# Patient Record
Sex: Male | Born: 1955 | Race: White | Hispanic: No | Smoking: Former smoker
Health system: Southern US, Community
[De-identification: ages and names within clinical notes are randomized; demographics above are authoritative.]

## PROBLEM LIST (undated history)

## (undated) DIAGNOSIS — M1990 Unspecified osteoarthritis, unspecified site: Secondary | ICD-10-CM

## (undated) DIAGNOSIS — J45909 Unspecified asthma, uncomplicated: Secondary | ICD-10-CM

## (undated) DIAGNOSIS — I10 Essential (primary) hypertension: Secondary | ICD-10-CM

## (undated) DIAGNOSIS — Z8719 Personal history of other diseases of the digestive system: Secondary | ICD-10-CM

## (undated) DIAGNOSIS — M5136 Other intervertebral disc degeneration, lumbar region: Secondary | ICD-10-CM

## (undated) DIAGNOSIS — Z87442 Personal history of urinary calculi: Secondary | ICD-10-CM

## (undated) DIAGNOSIS — I219 Acute myocardial infarction, unspecified: Secondary | ICD-10-CM

## (undated) DIAGNOSIS — J449 Chronic obstructive pulmonary disease, unspecified: Secondary | ICD-10-CM

## (undated) DIAGNOSIS — K900 Celiac disease: Secondary | ICD-10-CM

## (undated) DIAGNOSIS — I471 Supraventricular tachycardia, unspecified: Secondary | ICD-10-CM

## (undated) DIAGNOSIS — K219 Gastro-esophageal reflux disease without esophagitis: Secondary | ICD-10-CM

## (undated) DIAGNOSIS — F419 Anxiety disorder, unspecified: Secondary | ICD-10-CM

## (undated) DIAGNOSIS — M51369 Other intervertebral disc degeneration, lumbar region without mention of lumbar back pain or lower extremity pain: Secondary | ICD-10-CM

## (undated) DIAGNOSIS — D649 Anemia, unspecified: Secondary | ICD-10-CM

## (undated) DIAGNOSIS — D126 Benign neoplasm of colon, unspecified: Secondary | ICD-10-CM

## (undated) DIAGNOSIS — Z87891 Personal history of nicotine dependence: Secondary | ICD-10-CM

## (undated) DIAGNOSIS — I209 Angina pectoris, unspecified: Secondary | ICD-10-CM

## (undated) DIAGNOSIS — K859 Acute pancreatitis without necrosis or infection, unspecified: Secondary | ICD-10-CM

## (undated) DIAGNOSIS — I4891 Unspecified atrial fibrillation: Secondary | ICD-10-CM

## (undated) DIAGNOSIS — I2510 Atherosclerotic heart disease of native coronary artery without angina pectoris: Secondary | ICD-10-CM

## (undated) DIAGNOSIS — G4730 Sleep apnea, unspecified: Secondary | ICD-10-CM

## (undated) DIAGNOSIS — K5790 Diverticulosis of intestine, part unspecified, without perforation or abscess without bleeding: Secondary | ICD-10-CM

## (undated) DIAGNOSIS — C801 Malignant (primary) neoplasm, unspecified: Secondary | ICD-10-CM

## (undated) DIAGNOSIS — I499 Cardiac arrhythmia, unspecified: Secondary | ICD-10-CM

## (undated) DIAGNOSIS — I5032 Chronic diastolic (congestive) heart failure: Secondary | ICD-10-CM

## (undated) DIAGNOSIS — E119 Type 2 diabetes mellitus without complications: Secondary | ICD-10-CM

## (undated) HISTORY — PX: ESOPHAGEAL DILATION: SHX303

## (undated) HISTORY — PX: TONSILLECTOMY: SUR1361

## (undated) HISTORY — DX: Anemia, unspecified: D64.9

## (undated) HISTORY — PX: BYPASS GRAFT: SHX909

## (undated) HISTORY — PX: VASCULAR SURGERY: SHX849

## (undated) HISTORY — PX: CHOLECYSTECTOMY: SHX55

---
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
## 2000-11-16 DIAGNOSIS — I219 Acute myocardial infarction, unspecified: Secondary | ICD-10-CM

## 2000-11-16 HISTORY — DX: Acute myocardial infarction, unspecified: I21.9

## 2003-11-30 ENCOUNTER — Other Ambulatory Visit: Payer: Self-pay

## 2004-06-01 ENCOUNTER — Other Ambulatory Visit: Payer: Self-pay

## 2004-07-18 ENCOUNTER — Other Ambulatory Visit: Payer: Self-pay

## 2004-10-18 ENCOUNTER — Emergency Department: Payer: Self-pay | Admitting: Emergency Medicine

## 2004-10-18 ENCOUNTER — Other Ambulatory Visit: Payer: Self-pay

## 2004-11-27 ENCOUNTER — Emergency Department: Payer: Self-pay | Admitting: Emergency Medicine

## 2004-11-28 ENCOUNTER — Emergency Department: Payer: Self-pay | Admitting: General Practice

## 2005-04-04 ENCOUNTER — Emergency Department: Payer: Self-pay | Admitting: Emergency Medicine

## 2005-05-06 ENCOUNTER — Emergency Department: Payer: Self-pay | Admitting: Emergency Medicine

## 2005-05-10 ENCOUNTER — Emergency Department: Payer: Self-pay | Admitting: Emergency Medicine

## 2005-08-14 ENCOUNTER — Emergency Department: Payer: Self-pay | Admitting: Emergency Medicine

## 2005-10-05 ENCOUNTER — Inpatient Hospital Stay: Payer: Self-pay | Admitting: Internal Medicine

## 2005-10-05 ENCOUNTER — Other Ambulatory Visit: Payer: Self-pay

## 2005-11-02 ENCOUNTER — Other Ambulatory Visit: Payer: Self-pay

## 2005-11-02 ENCOUNTER — Inpatient Hospital Stay: Payer: Self-pay | Admitting: Internal Medicine

## 2005-11-03 ENCOUNTER — Other Ambulatory Visit: Payer: Self-pay

## 2005-11-30 ENCOUNTER — Other Ambulatory Visit: Payer: Self-pay

## 2005-11-30 ENCOUNTER — Emergency Department: Payer: Self-pay | Admitting: Emergency Medicine

## 2006-01-25 ENCOUNTER — Emergency Department: Payer: Self-pay | Admitting: General Practice

## 2006-03-26 ENCOUNTER — Other Ambulatory Visit: Payer: Self-pay

## 2006-03-27 ENCOUNTER — Inpatient Hospital Stay: Payer: Self-pay

## 2006-06-10 ENCOUNTER — Emergency Department: Payer: Self-pay | Admitting: Emergency Medicine

## 2006-07-03 ENCOUNTER — Emergency Department: Payer: Self-pay | Admitting: Emergency Medicine

## 2006-07-03 ENCOUNTER — Other Ambulatory Visit: Payer: Self-pay

## 2006-12-03 ENCOUNTER — Ambulatory Visit: Payer: Self-pay | Admitting: Cardiovascular Disease

## 2007-02-14 IMAGING — CR LEFT INDEX FINGER 2+V
1 series · 1 of 1 positions shown · non-contrast
Comparison: none

REASON FOR EXAM: Laceration, caught between door hinges
COMMENTS:  LMP: (Male)

[view not recorded]
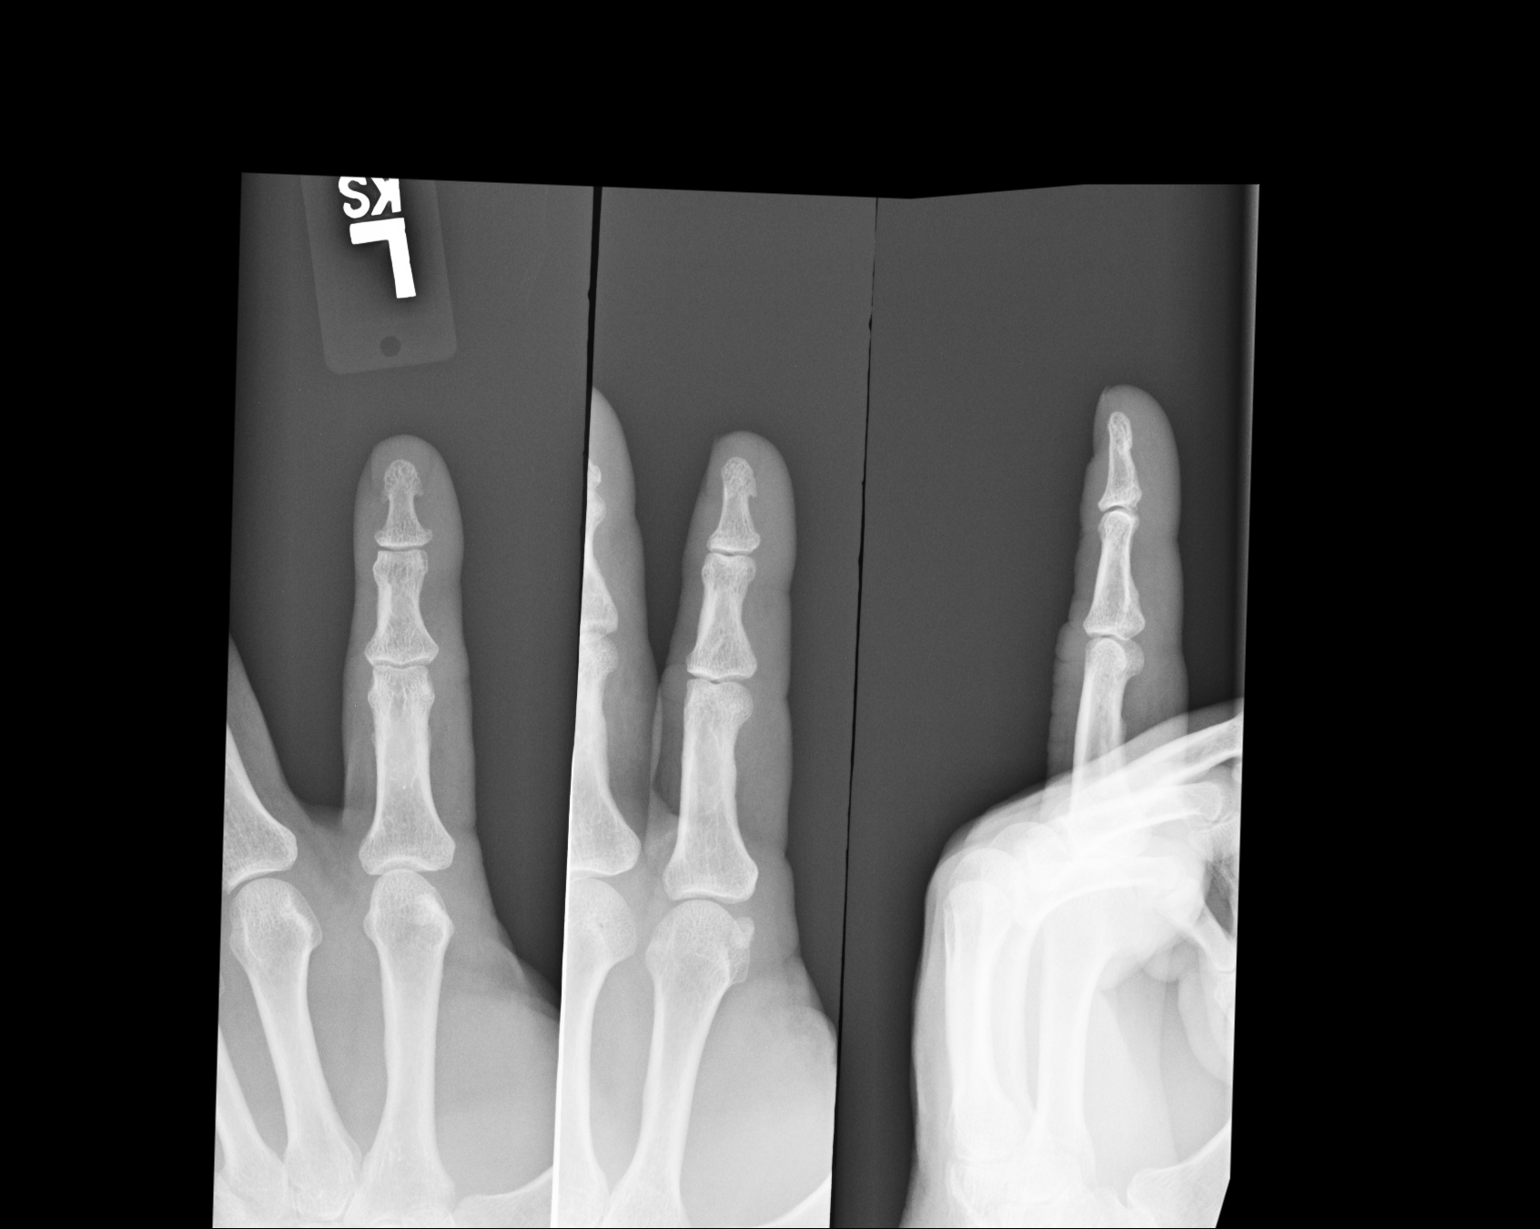

[1 of 1 positions shown; findings below may reference images not displayed]

PROCEDURE:     DXR - DXR FINGER INDEX 2ND DIGIT LT HA  - April 04, 2005  [DATE]

RESULT:     There does not appear to be evidence of fracture, dislocation,
or malalignment.  If there is persistent clinical concern or persistent
complaints of pain, repeat evaluation in 7-10 days is recommended if
clinically warranted.
IMPRESSION: Unremarkable LEFT second finger.

## 2007-08-17 IMAGING — CR DG CHEST 1V PORT
1 series · 1 of 1 positions shown · non-contrast
Comparison: none

REASON FOR EXAM: chest pain
COMMENTS:

[view not recorded]
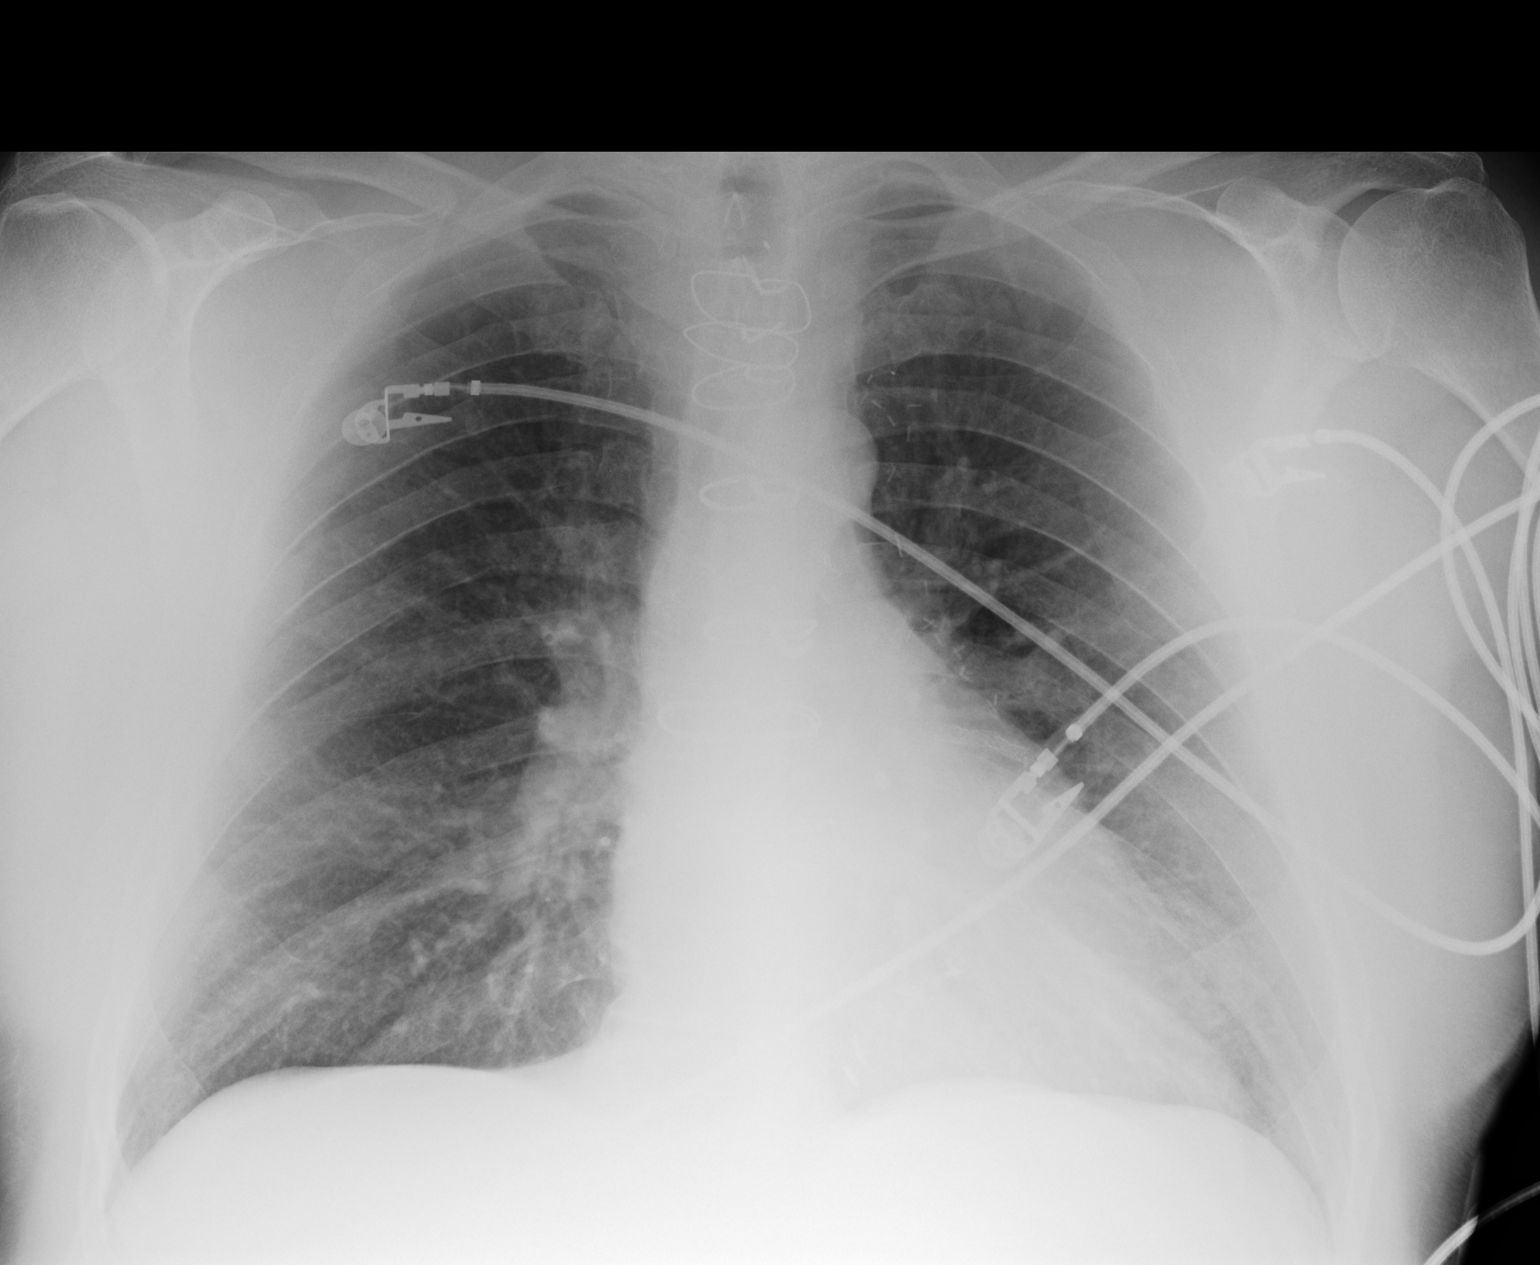

[1 of 1 positions shown; findings below may reference images not displayed]

PROCEDURE:     DXR - DXR PORTABLE CHEST SINGLE VIEW  - October 05, 2005  [DATE]

RESULT:     Comparison is made to study 28 November, 2004.

The lungs are mildly hyperinflated. Mildly increased perihilar lung markings
are present, but are stable. The heart is top normal in size.  The central
pulmonary vascularity is prominent but stable. There is no pleural effusion.
IMPRESSION: 1)There are findings consistent with COPD. Mildly increased interstitial
markings are present and stable and may reflect an element of low-grade
interstitial edema.  No pleural effusion is seen and there is no overt
evidence of alveolar edema. A follow-up PA and lateral chest x-ray would be
of value.

## 2007-09-14 IMAGING — CR DG ABDOMEN 1V
1 series · 2 of 2 positions shown · non-contrast
Comparison: none

REASON FOR EXAM: Abdominal pain,  rm 19
COMMENTS:

[Series 1: view not recorded · 0.17mm/px · 2 of 2 slices shown]
[im 1/2]
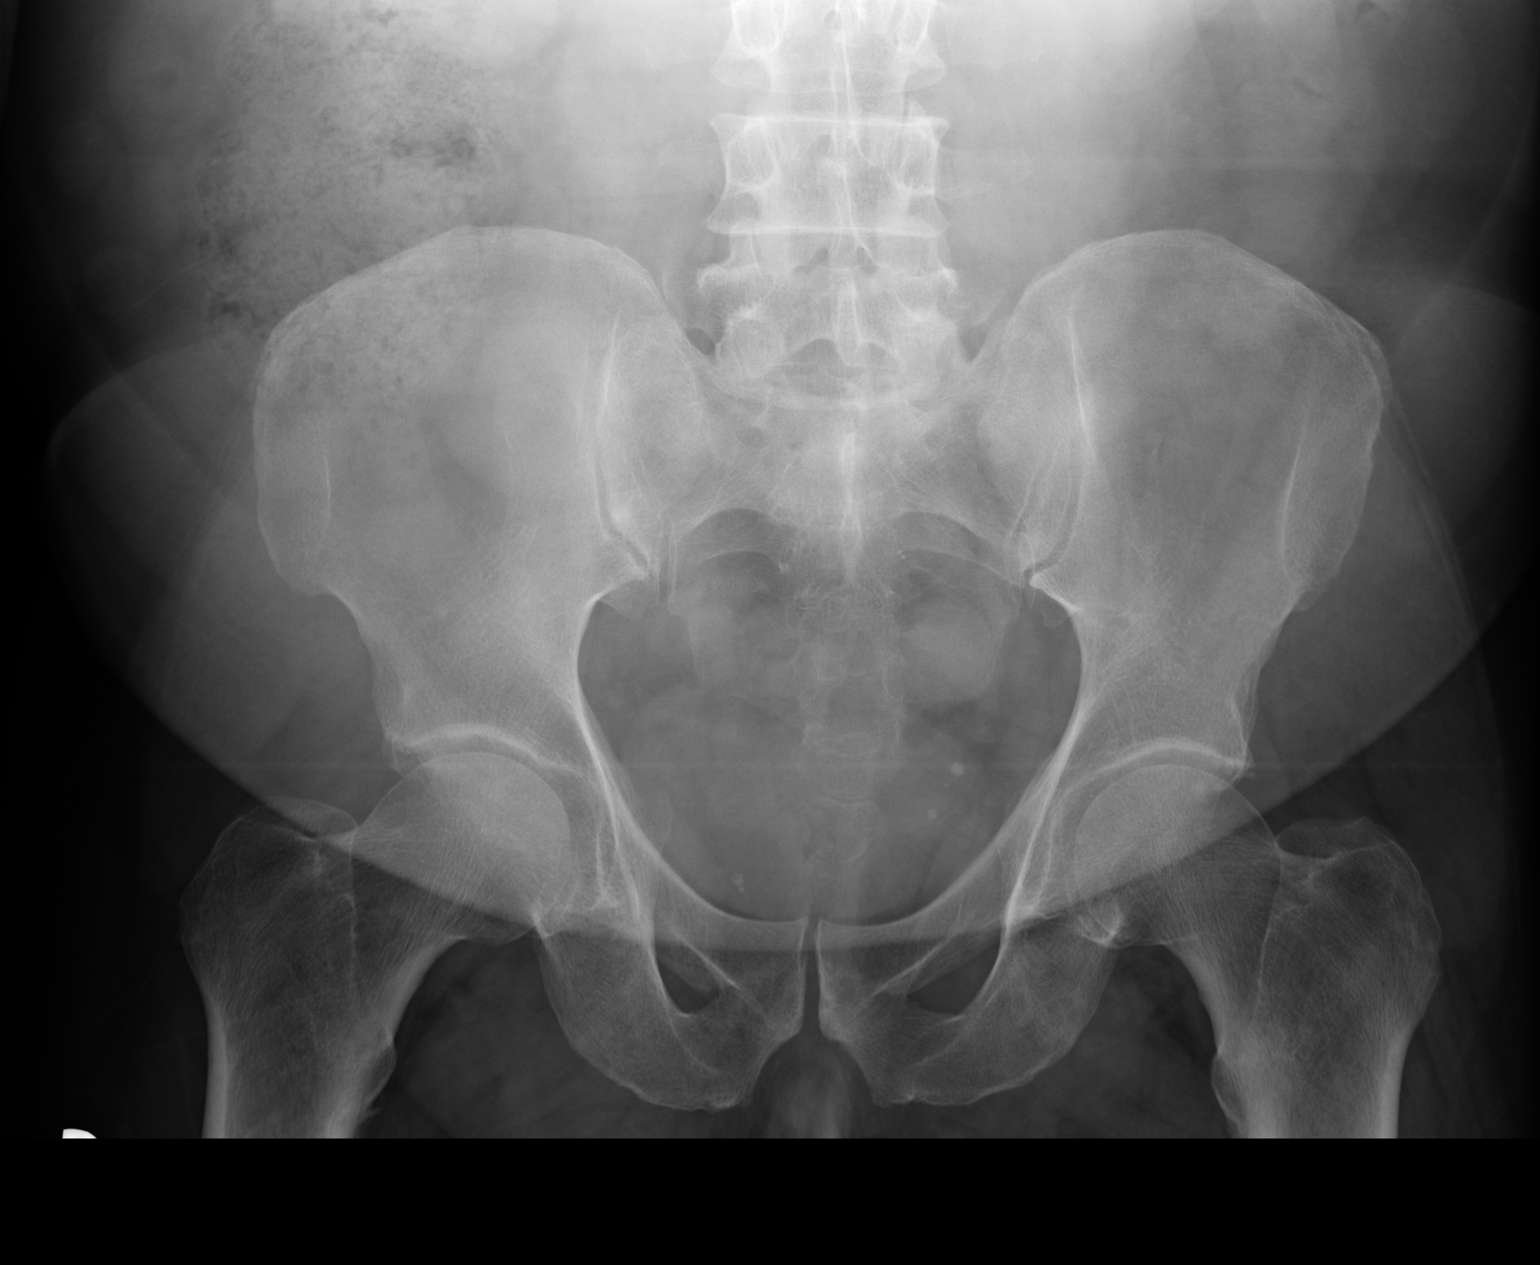
[im 2/2]
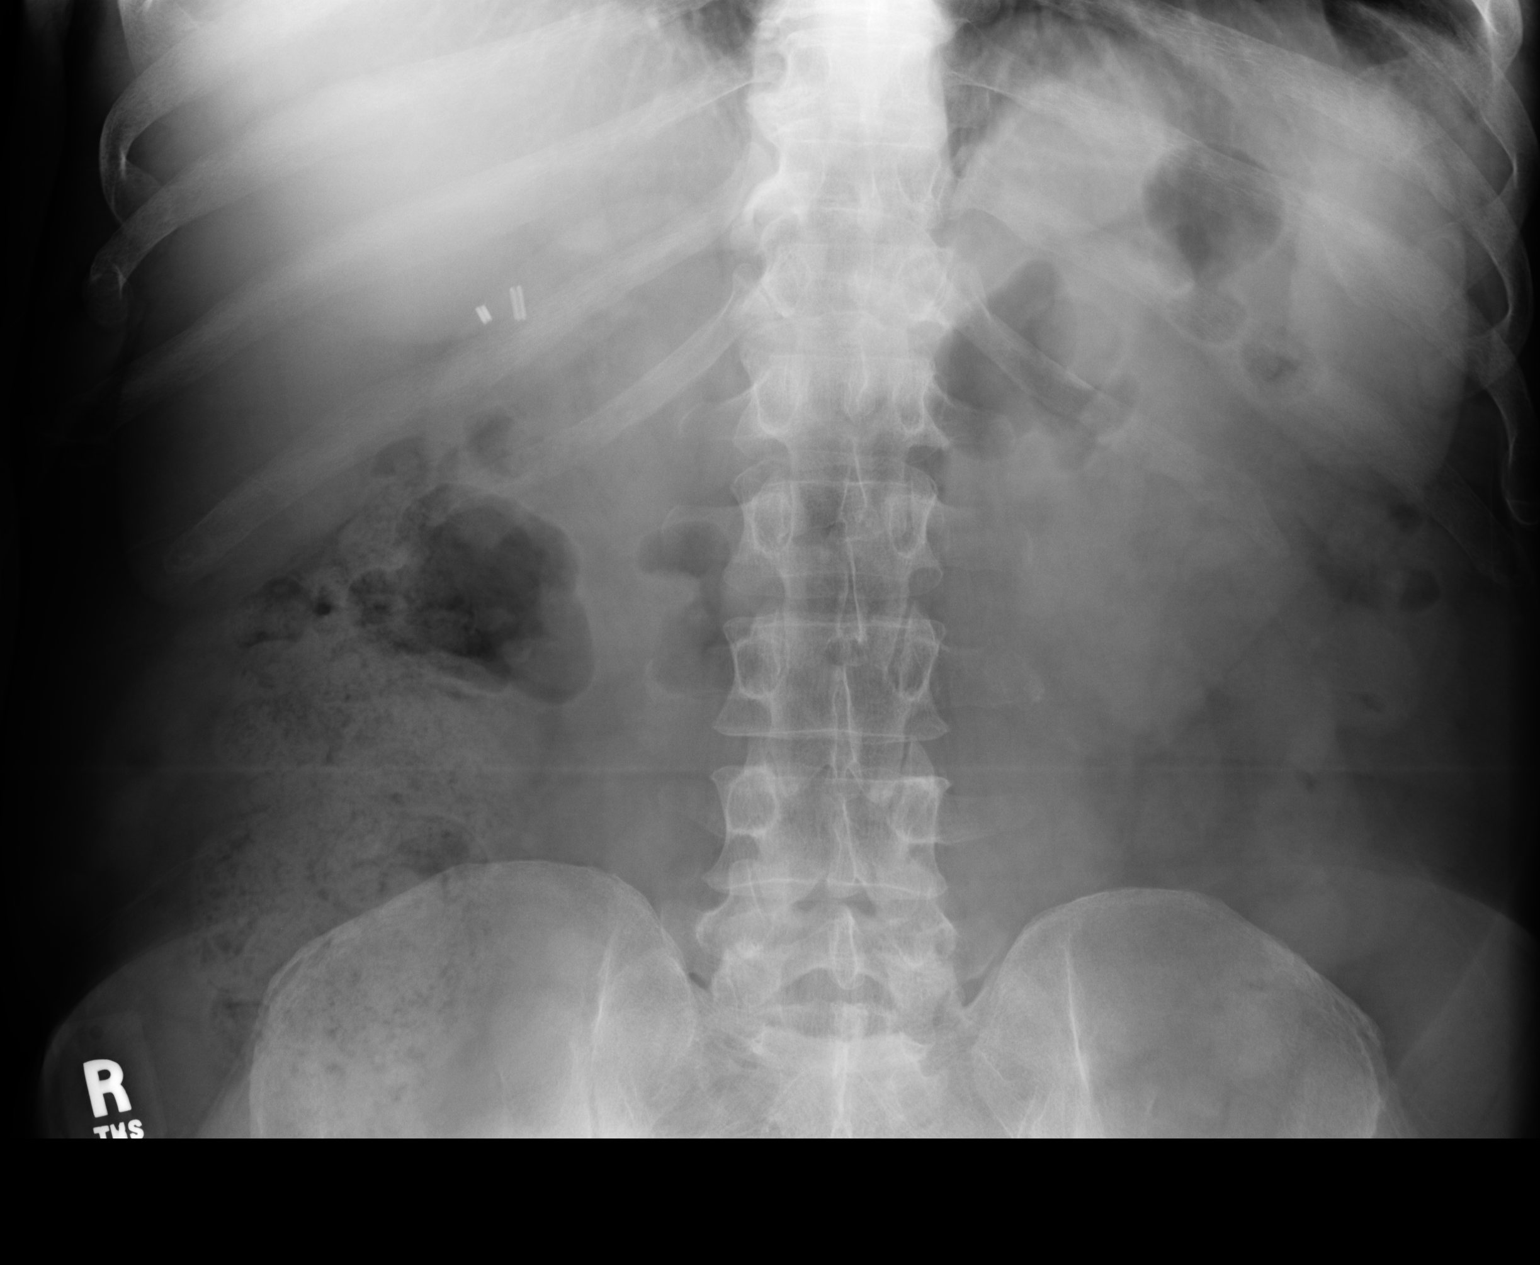

[2 of 2 positions shown; findings below may reference images not displayed]

PROCEDURE:     DXR - DXR KIDNEY URETER BLADDER  - November 02, 2005  [DATE]

RESULT:        Views of the abdomen demonstrate cholecystectomy clips in the
RIGHT upper quadrant.  Some radiopacities are seen in the LEFT pelvic region
and could represent phleboliths or distal LEFT ureteral stones.  Correlation
with IVP or CT could be obtained, if desired.  There are no films for
comparison.
IMPRESSION: 1.     No evidence of bowel obstruction.  There is a moderate amount of
stool scattered through the colon down to the rectum.
2.     Cannot exclude the possibility of some distal LEFT ureteral stones.
3.     Status post cholecystectomy.

## 2007-09-17 IMAGING — CT CT ABDOMEN W/ CM
1 of 2 series · 15 of 32 positions shown, 19 images · non-contrast
Comparison: none

REASON FOR EXAM: Pancreatitis,elevated enzymes
COMMENTS:

[Series 2: pancreas · axial · 0.79mm/px · z∈[-362,-110]mm · 15 of 71 slices shown, 19 images]
[im 4/71  soft-tissue]
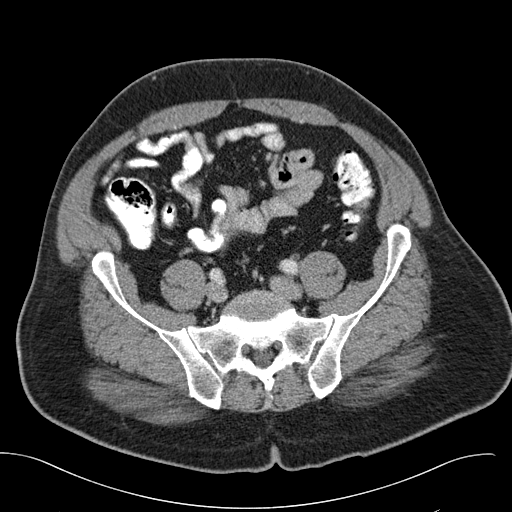
[im 4/71  bone]
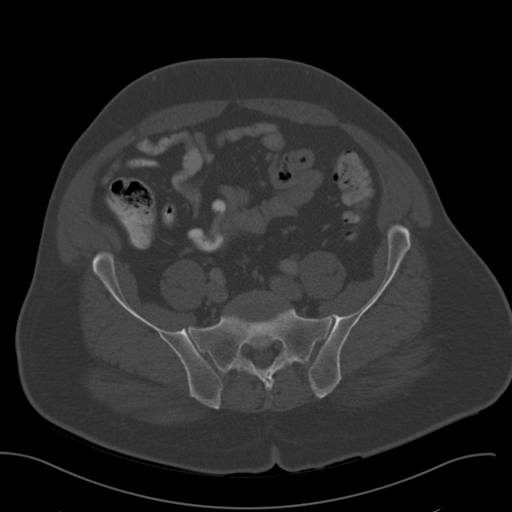
[im 10/71  soft-tissue]
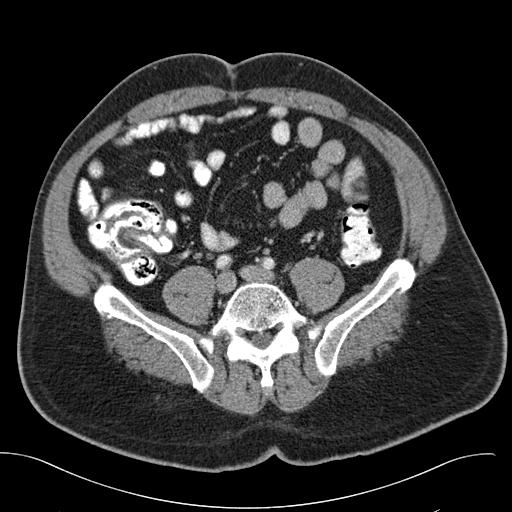
[im 16/71  soft-tissue]
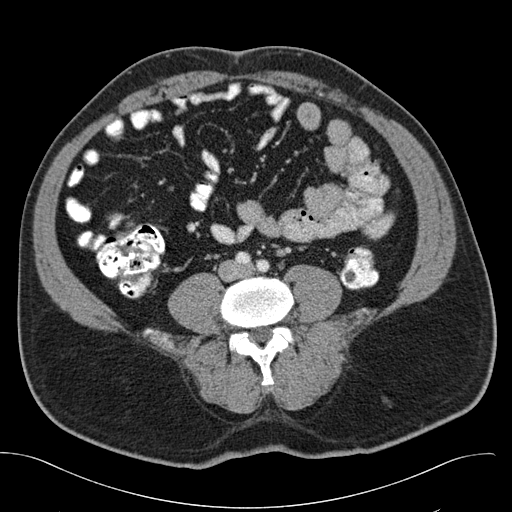
[im 19/71  soft-tissue]
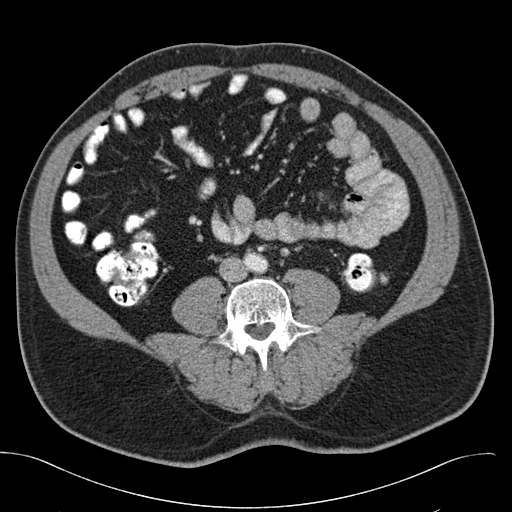
[im 25/71  soft-tissue]
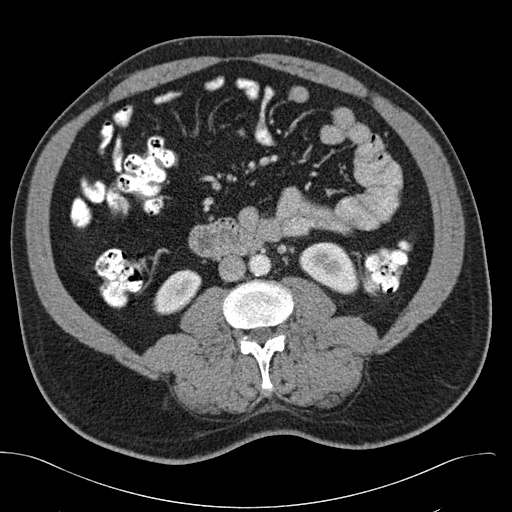
[im 31/71  soft-tissue]
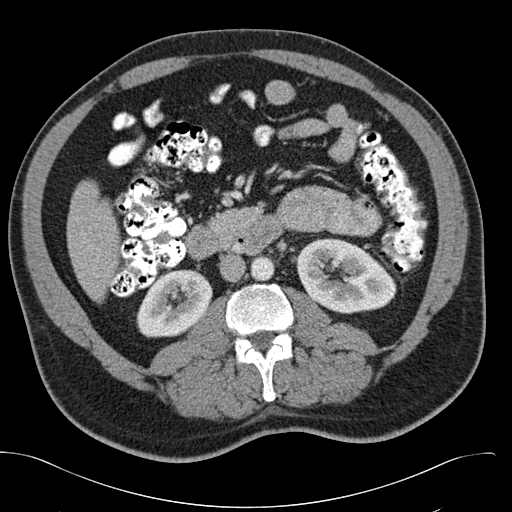
[im 37/71  soft-tissue]
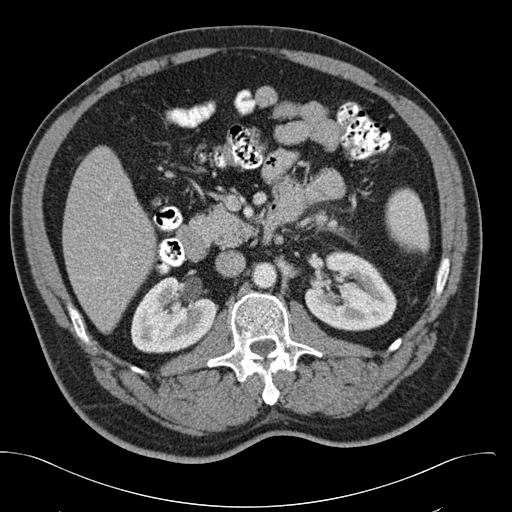
[im 40/71  soft-tissue]
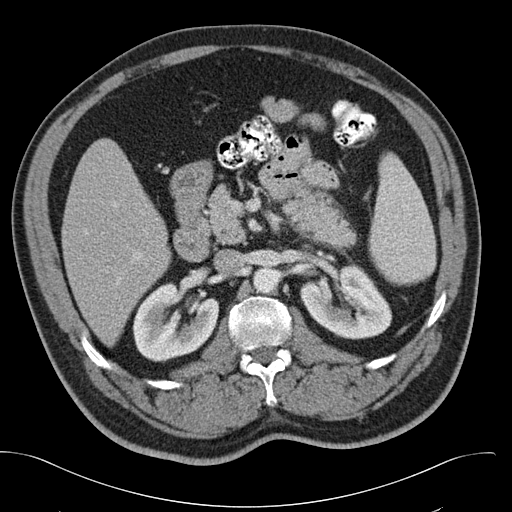
[im 46/71  soft-tissue]
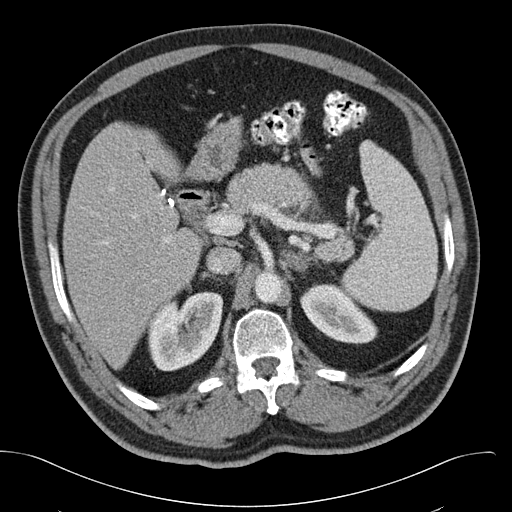
[im 46/71  bone]
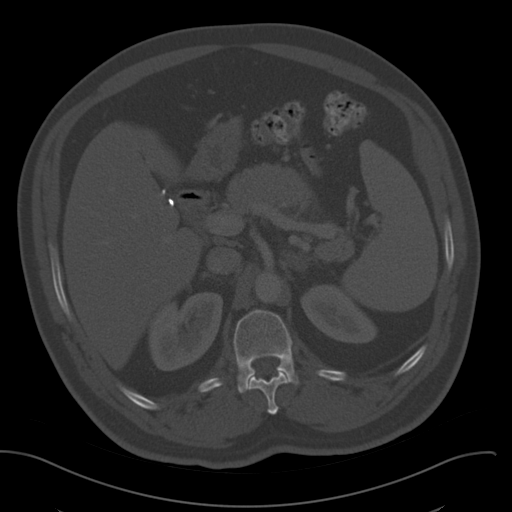
[im 52/71  soft-tissue]
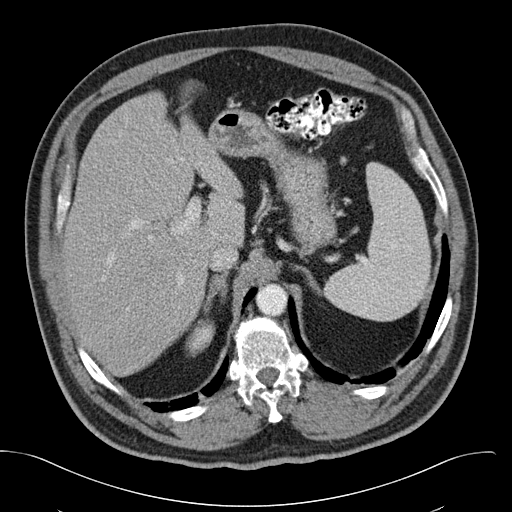
[im 55/71  soft-tissue]
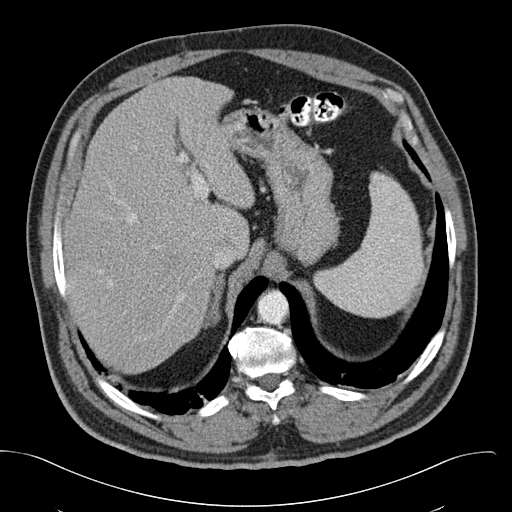
[im 58/71  lung]
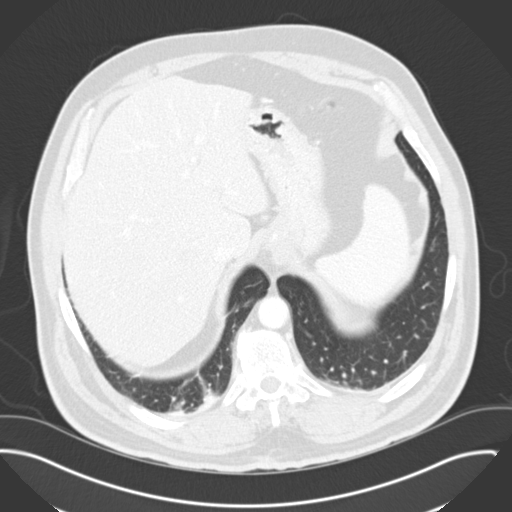
[im 61/71  soft-tissue]
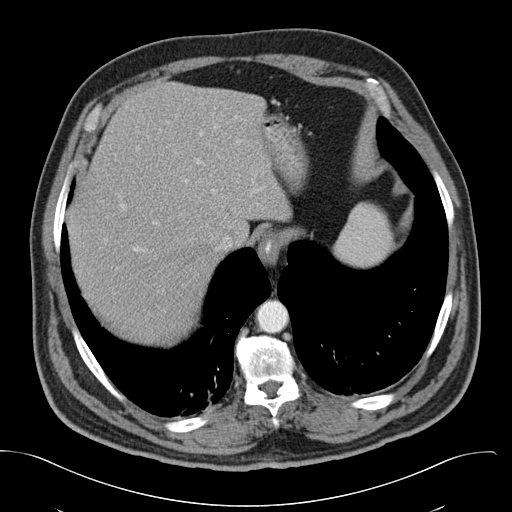
[im 61/71  lung]
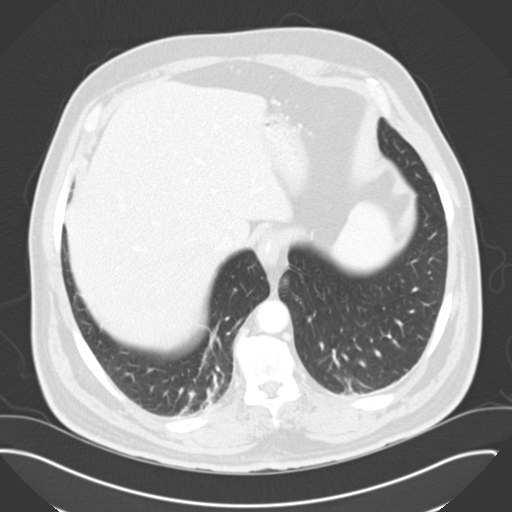
[im 64/71  lung]
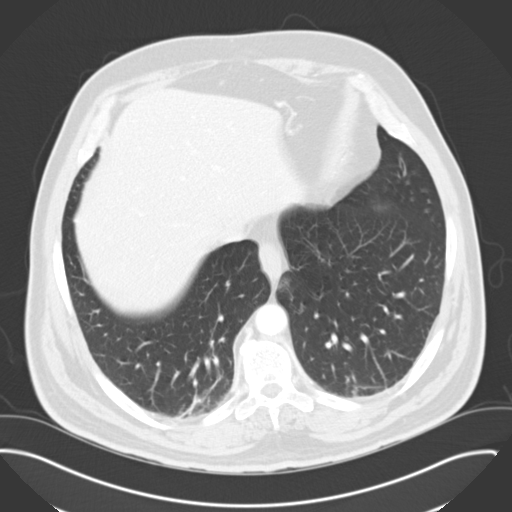
[im 67/71  soft-tissue]
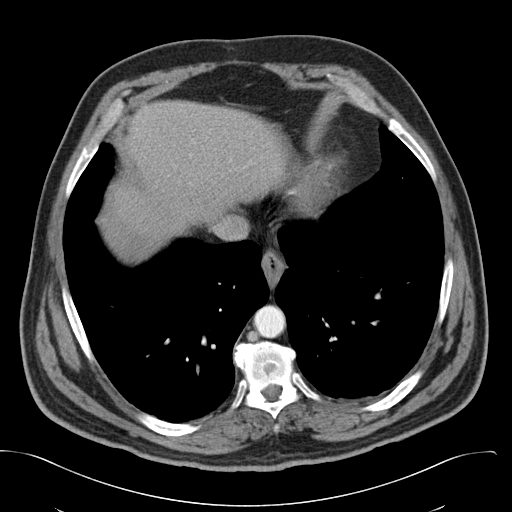
[im 67/71  lung]
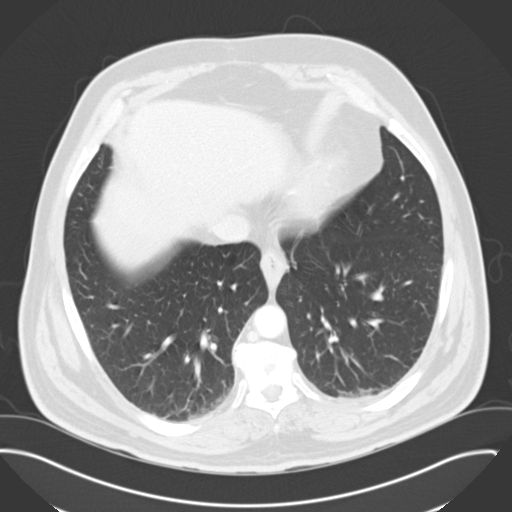

[15 of 32 positions shown; findings below may reference images not displayed]

PROCEDURE:     CT  - CT ABDOMEN COMPLEX W  - November 05, 2005  [DATE]

RESULT:         Comparison is made to a prior study dated 04/19/02.

Spiral 4-mm sections were obtained from the lung bases through the superior
iliac crest.
Evaluation of the lung bases demonstrates dependent atelectasis and/or
scarring and otherwise grossly unremarkable.  Note, if clinically warranted
bibasilar minimal infiltrates cannot be excluded.

The liver, spleen, kidneys are unremarkable.  The adrenals demonstrate a
prominent appearance though maintain an adreniform shape and appears stable
when compared to the previous study.  Evaluation of the pancreas
demonstrates no evidence of peripancreatic fluid nor drainable loculated
fluid collections.  A mild amount of inflammatory change is demonstrated
within the inferior/posterior tail of the pancreas region likely
representing an element of mild mesentery edema.  The celiac, SMA, portal
vein, IMA, renal arteries and veins are opacified and appear unremarkable.
There is no CT evidence to suggest the sequela of a pseudoaneurysm.  The
patient is status post a cholecystectomy.  No drainable loculated fluid
collections are demonstrated within the abdomen nor masses or adenopathy.
IMPRESSION: CT findings suggesting the sequela of early/mild pancreatitis without
evidence of pseudocyst nor a significant free fluid or further areas of
drainable loculated fluid collections.  As stated above, the vasculature
appears to be opacified.  The caliber of the vessels cannot be excluded.
There is no CT evidence of pseudoaneurysm.

## 2007-10-12 IMAGING — CR DG CHEST 2V
1 series · 2 of 2 positions shown · non-contrast
Comparison: none

REASON FOR EXAM: Shortness of breath
COMMENTS:

[Series 1: view not recorded · 0.17mm/px · 2 of 2 slices shown]
[im 1/2]
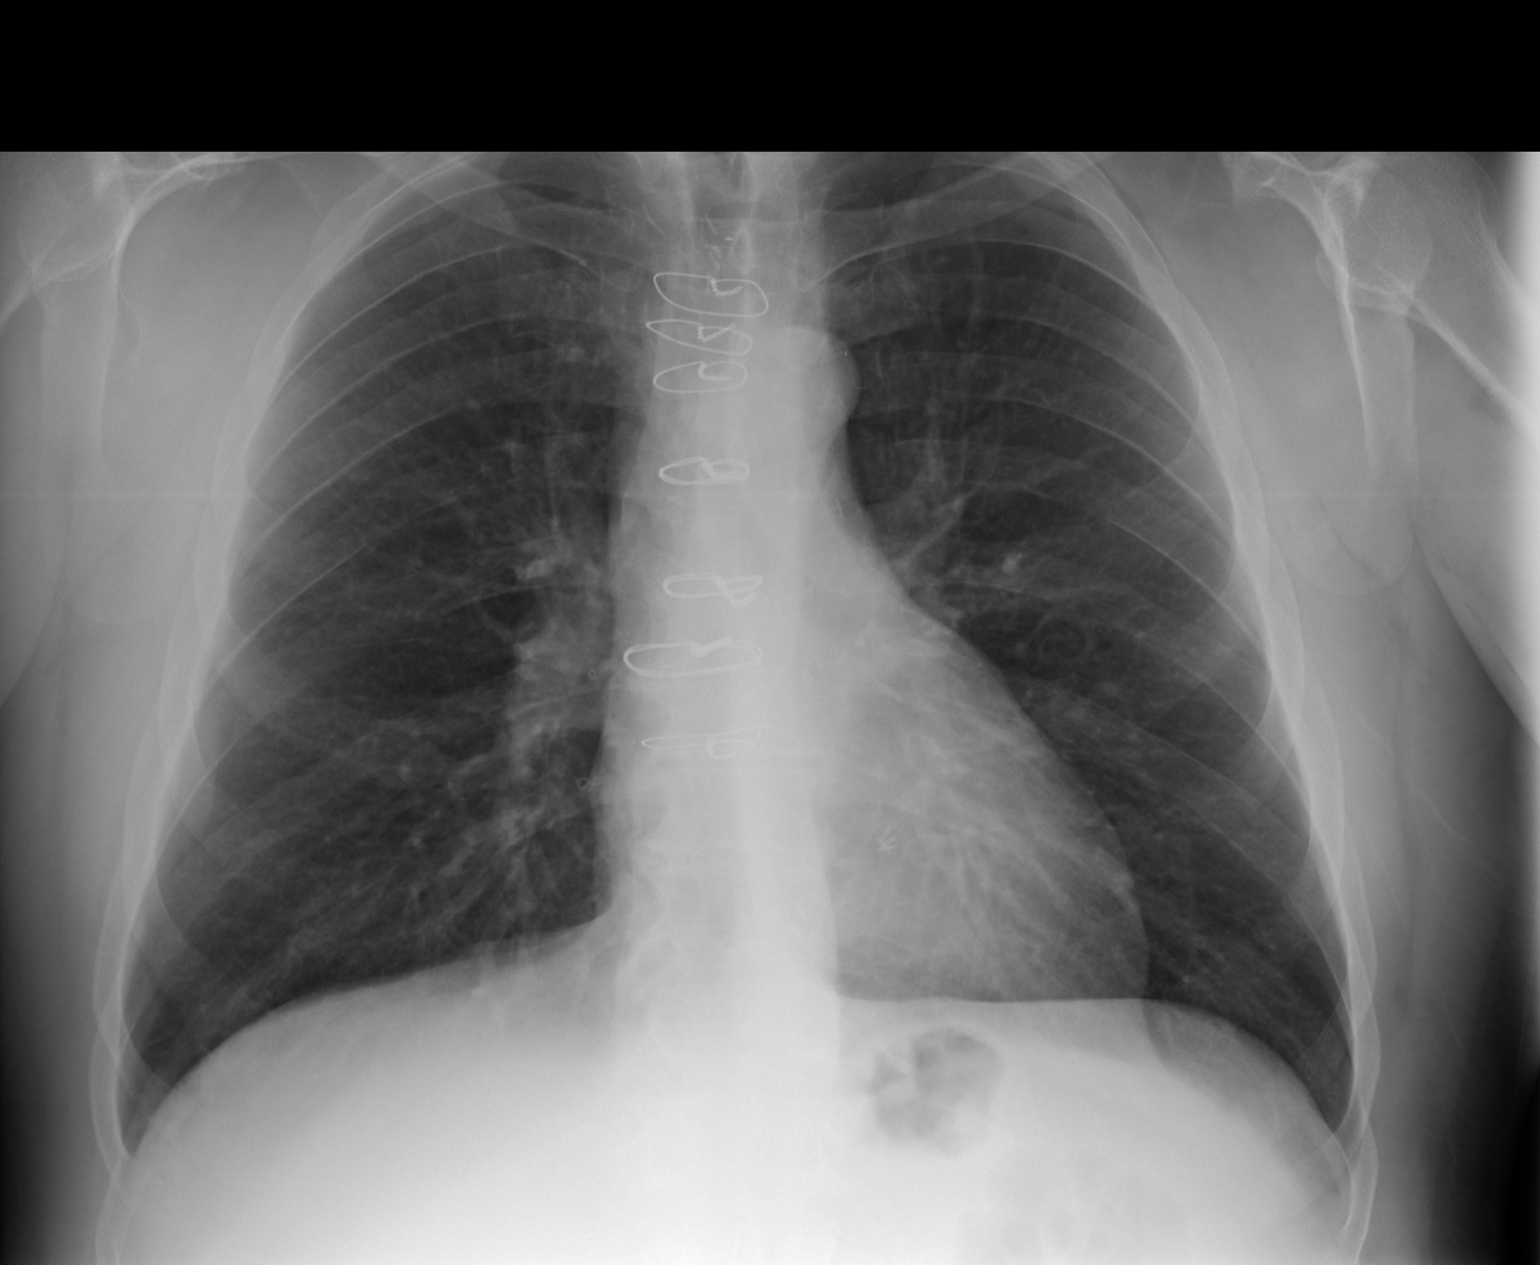
[im 2/2]
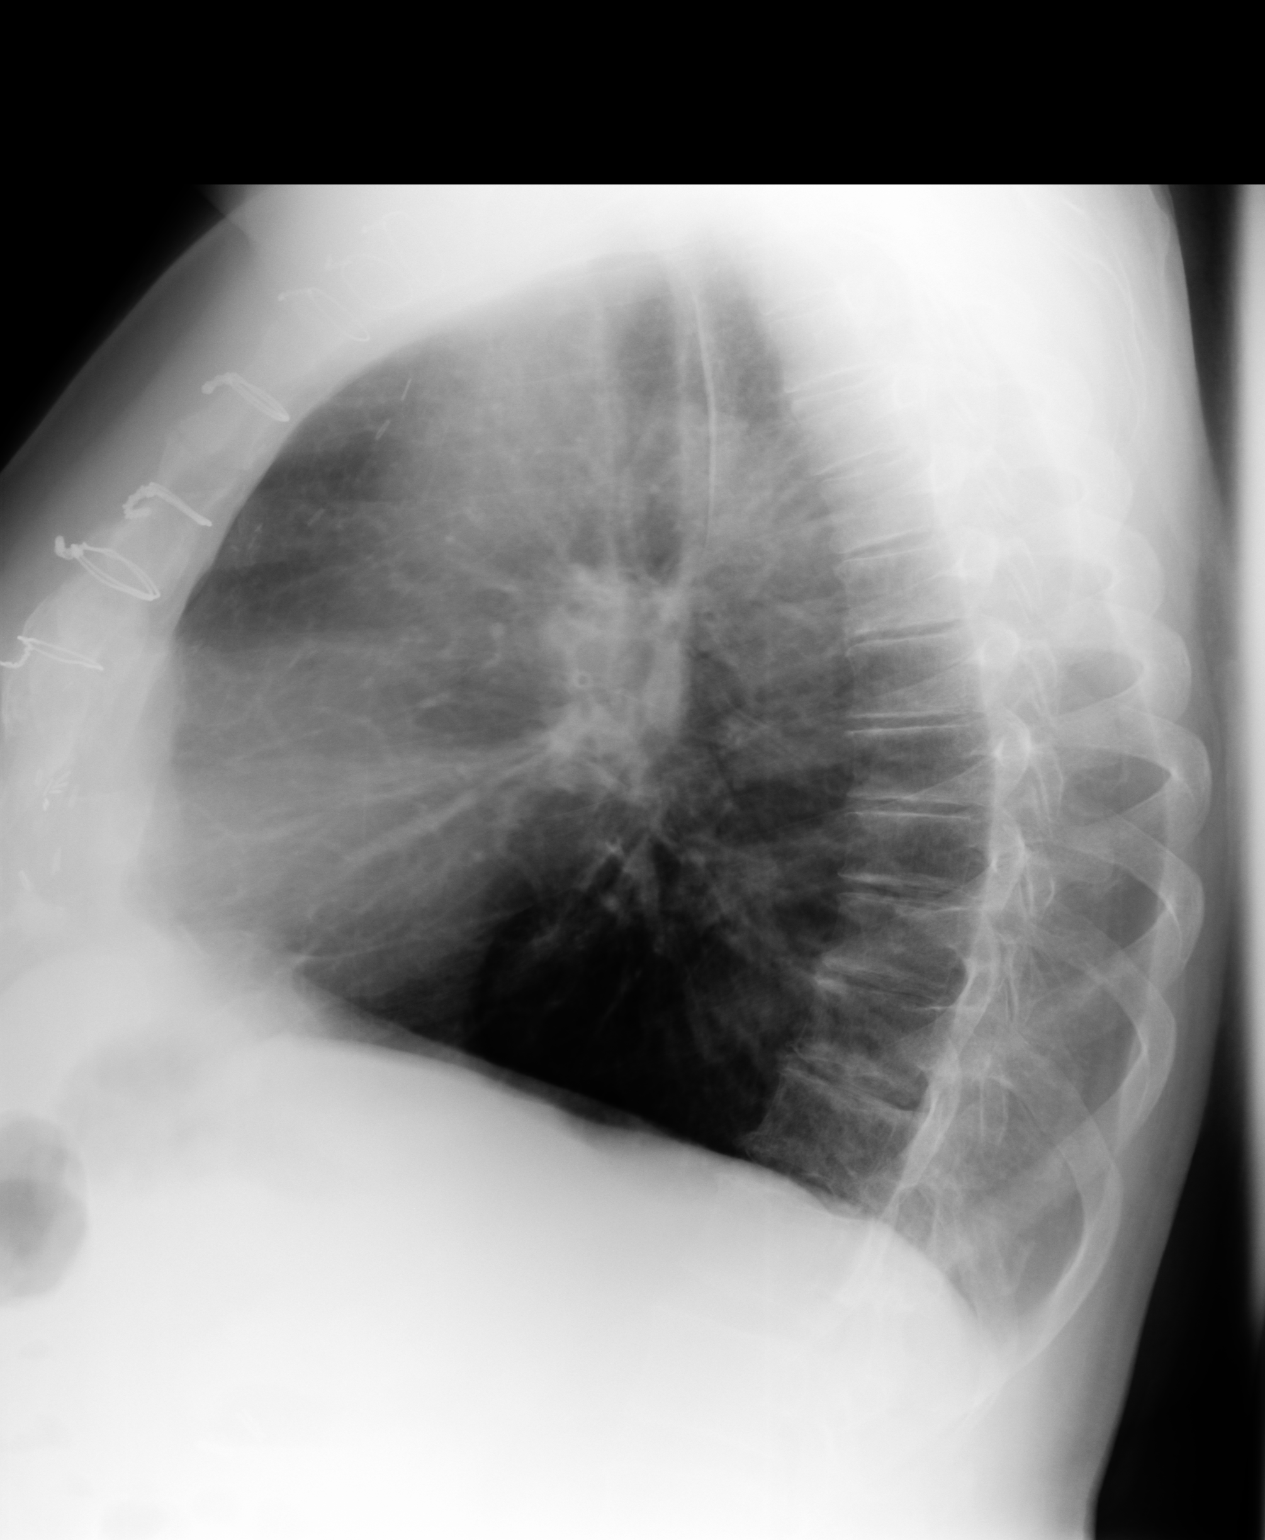

[2 of 2 positions shown; findings below may reference images not displayed]

PROCEDURE:     DXR - DXR CHEST PA (OR AP) AND LATERAL  - November 30, 2005 [DATE]

RESULT:     The current exam is compared to a prior exam of 11/02/2005.

The lung fields are clear. The chest appears mildly hyperexpanded compatible
with a history of asthma or COPD. The heart size is normal. Post-operative
changes of prior CABG are noted.
IMPRESSION: 1.     The lung fields are clear.
2.     The patient is status post CABG.
3.     There is mild hyperexpansion of the lungs compatible with a history
of COPD or asthma but with no progressive hyperexpansion seen as compared to
the prior exam.

## 2008-02-05 IMAGING — CR DG ABDOMEN 3V
1 series · 5 of 5 positions shown · non-contrast
Comparison: none

REASON FOR EXAM: ABDOMINAL PAIN
COMMENTS:  LMP: (Male)

[Series 1: view not recorded · 0.17mm/px · 5 of 5 slices shown]
[im 1/5]
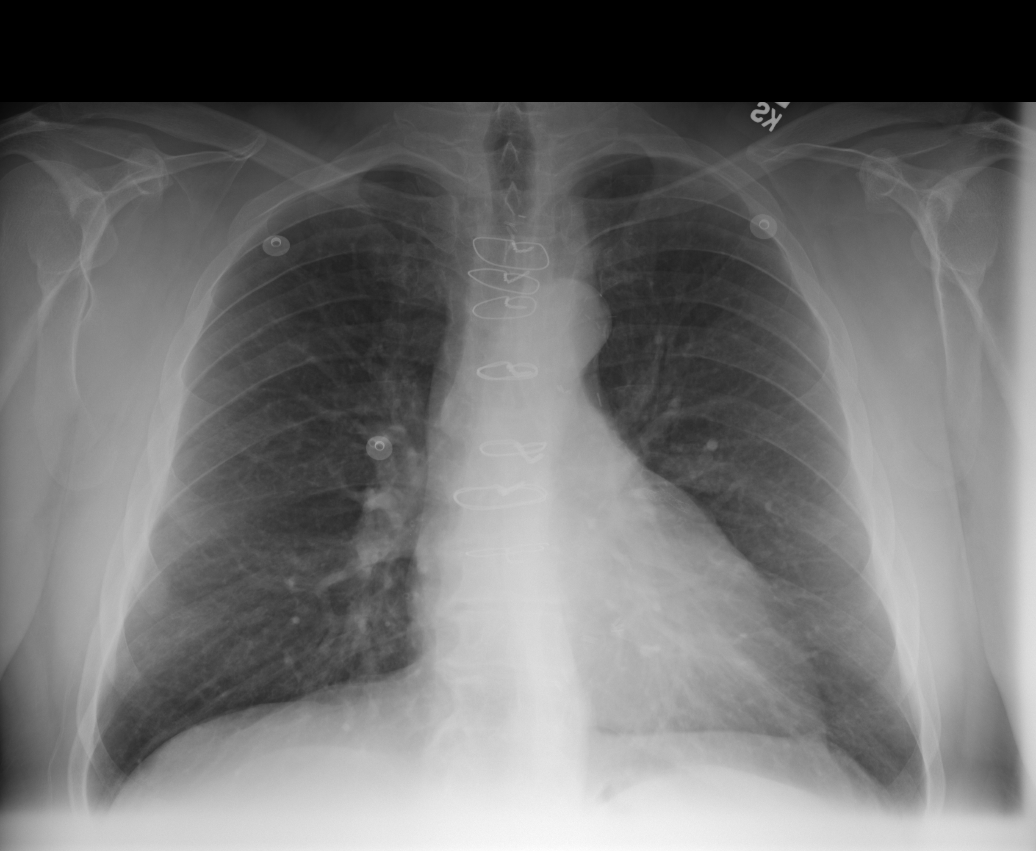
[im 2/5]
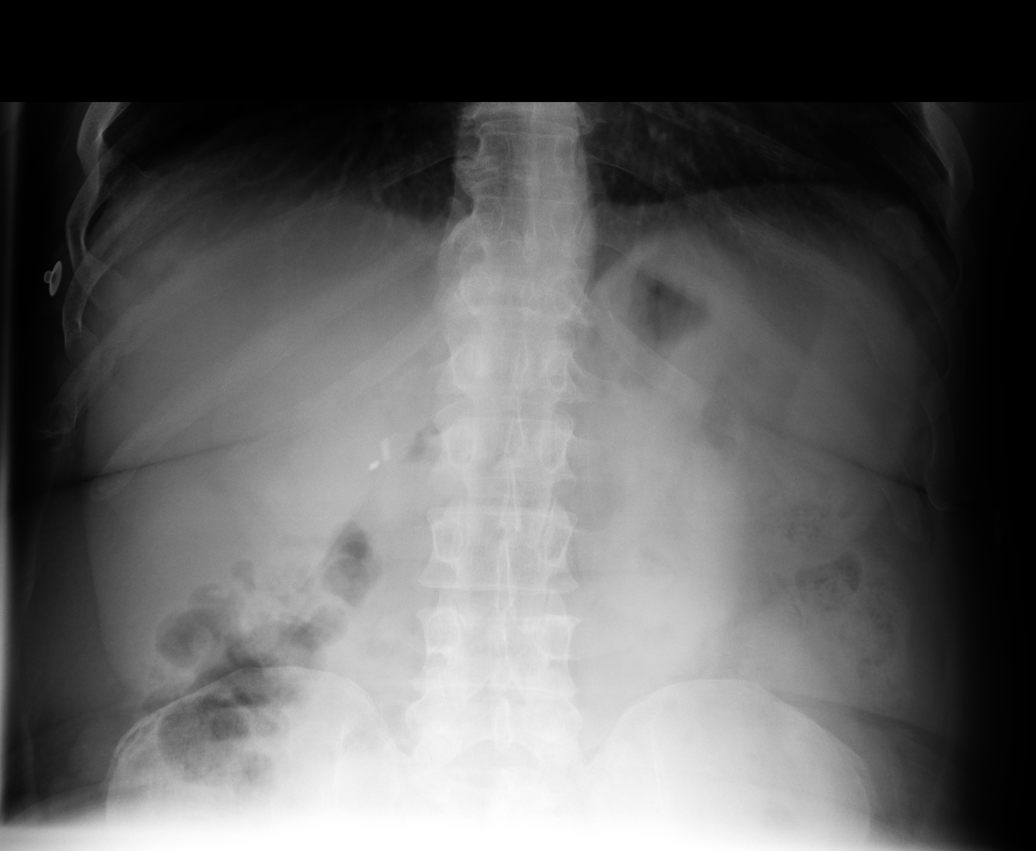
[im 3/5]
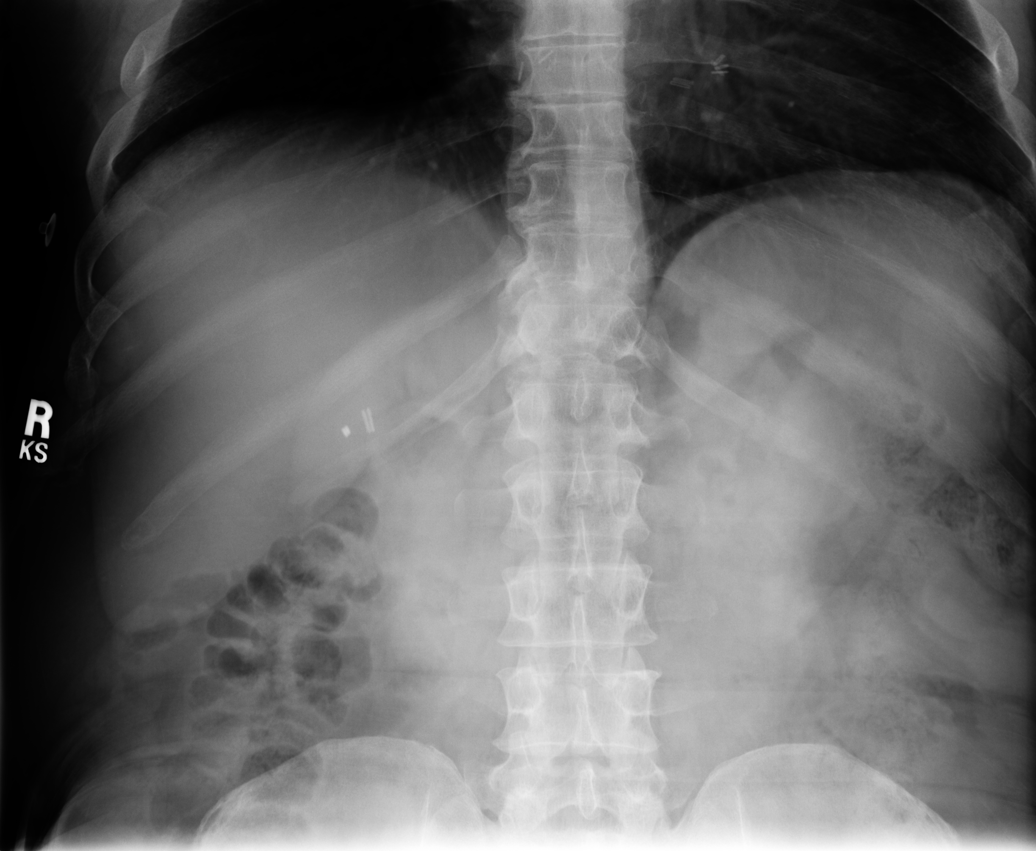
[im 4/5]
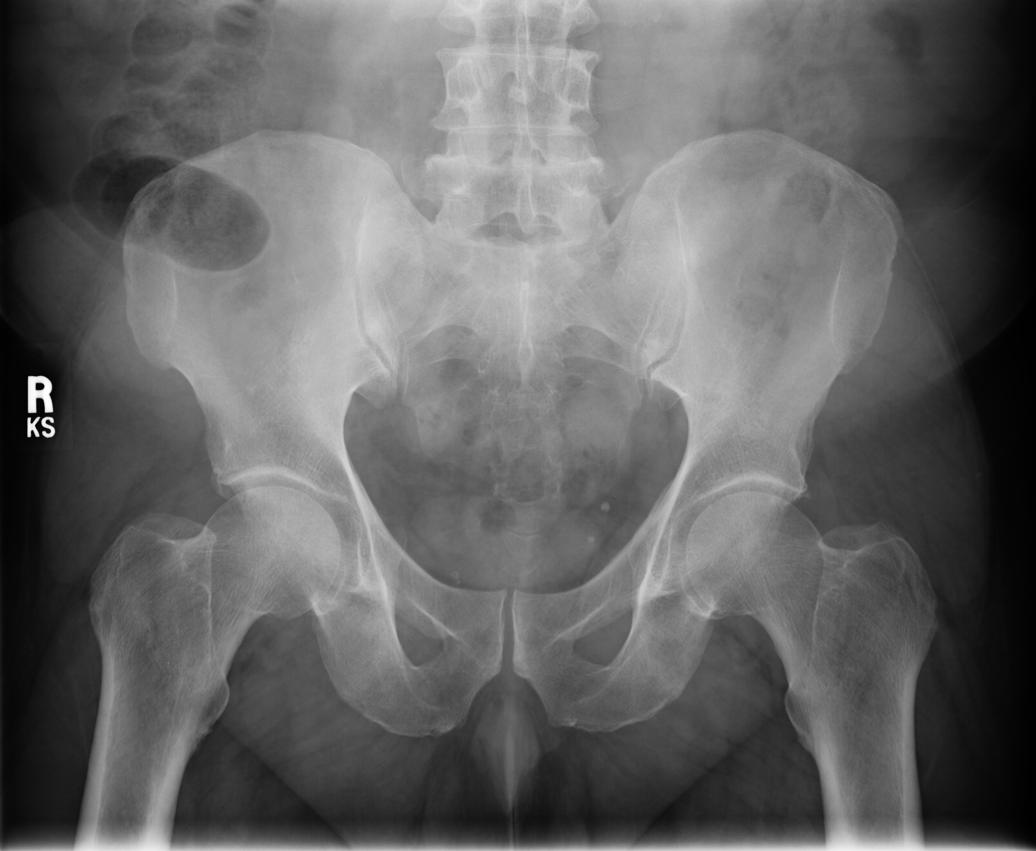
[im 5/5]
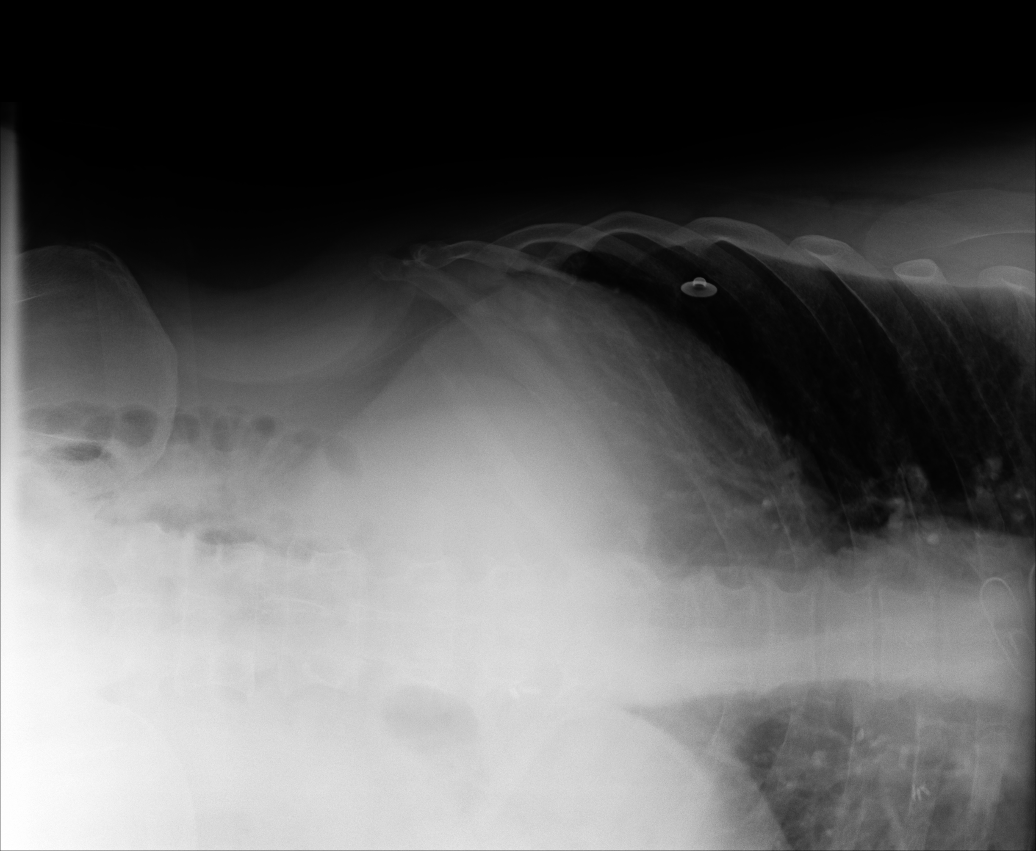

[5 of 5 positions shown; findings below may reference images not displayed]

PROCEDURE:     DXR - DXR ABDOMEN 3-WAY (INCL PA CXR)  - March 26, 2006  [DATE]

RESULT:          The soft tissue structures are unremarkable.  The gas
pattern is nonspecific.  No free air is identified.

PA chest demonstrates no acute cardiopulmonary disease.  The patient has had
a prior median sternotomy.  Surgical clips are present in the RIGHT upper
quadrant.
IMPRESSION: Nonspecific exam.  Chest x-ray reveals evidence of a
prior median sternotomy and is otherwise unremarkable.

## 2008-04-21 IMAGING — CR DG FOOT COMPLETE 3+V*L*
1 series · 3 of 3 positions shown · non-contrast
Comparison: none

REASON FOR EXAM: Pain
COMMENTS:

PROCEDURE:     DXR - DXR FOOT LT COMP W/OBLIQUES  - June 10, 2006 [DATE]
RESULT:     Three views of the LEFT foot show no fracture, dislocation or
other acute bony abnormality.
Incidental note is made of a small, plantar calcaneal spur.

[Series 1: view not recorded · 0.17mm/px · 3 of 3 slices shown]
[im 1/3]
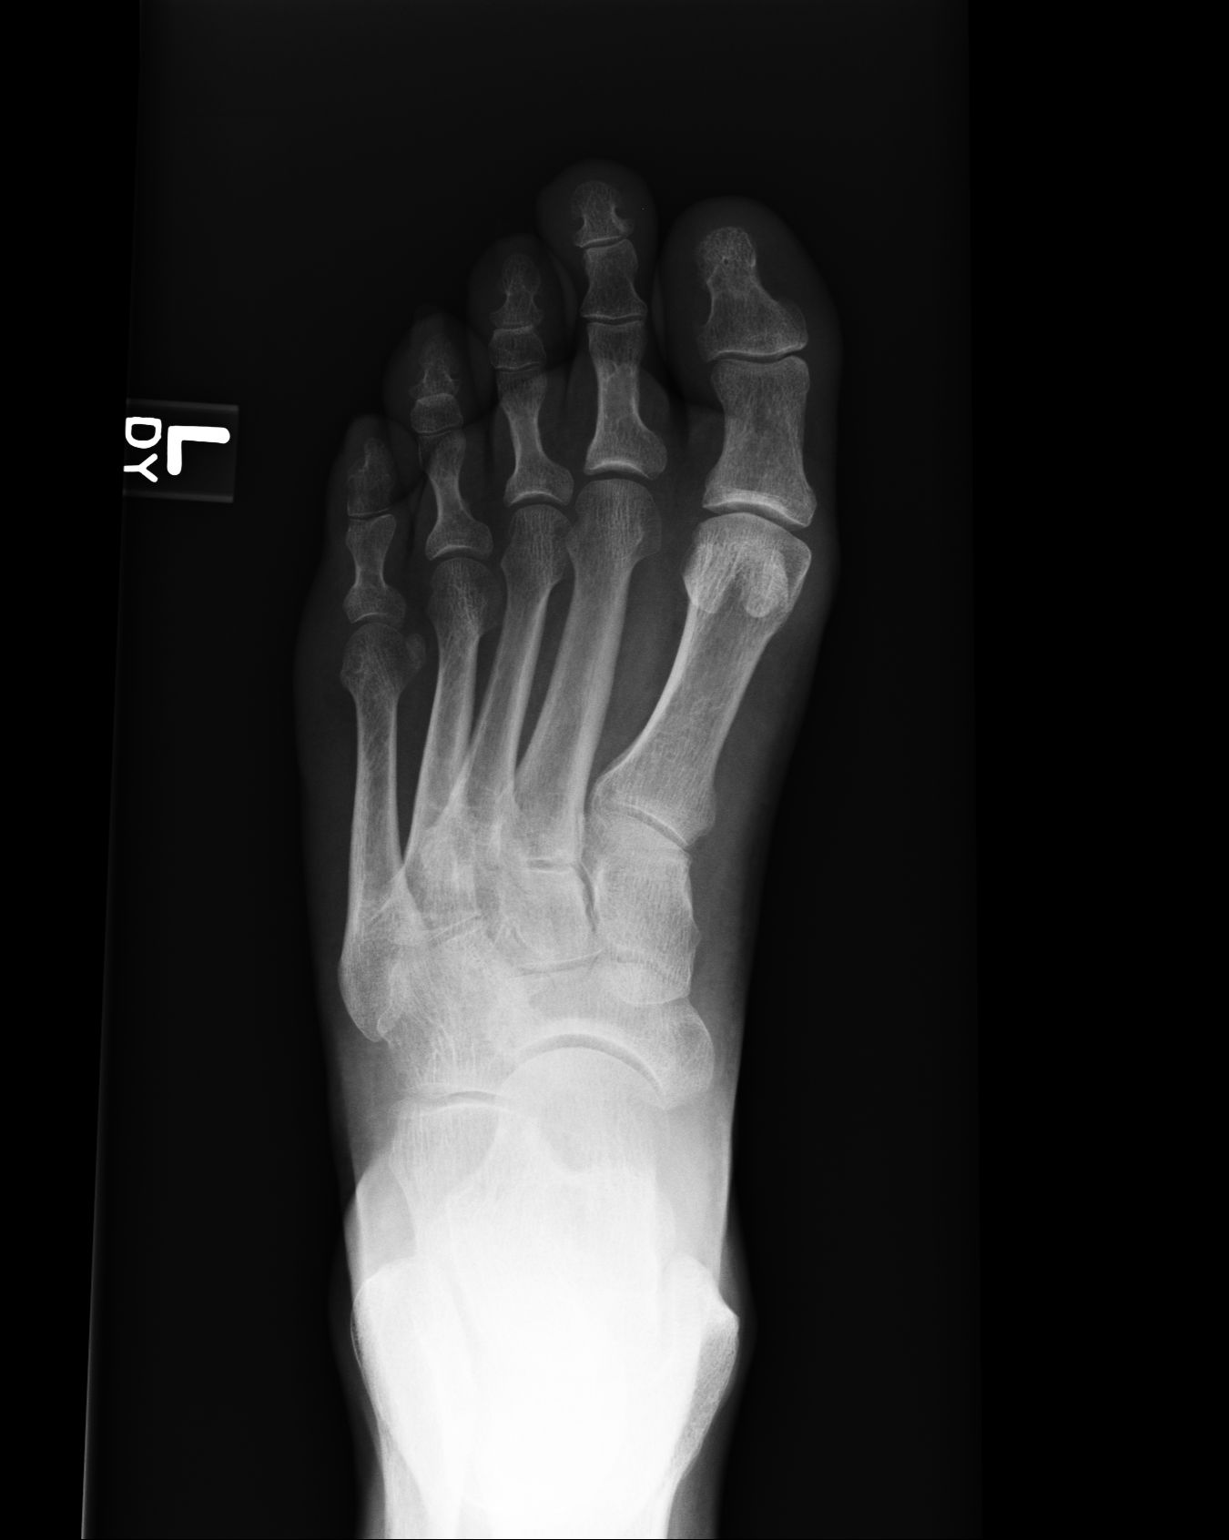
[im 2/3]
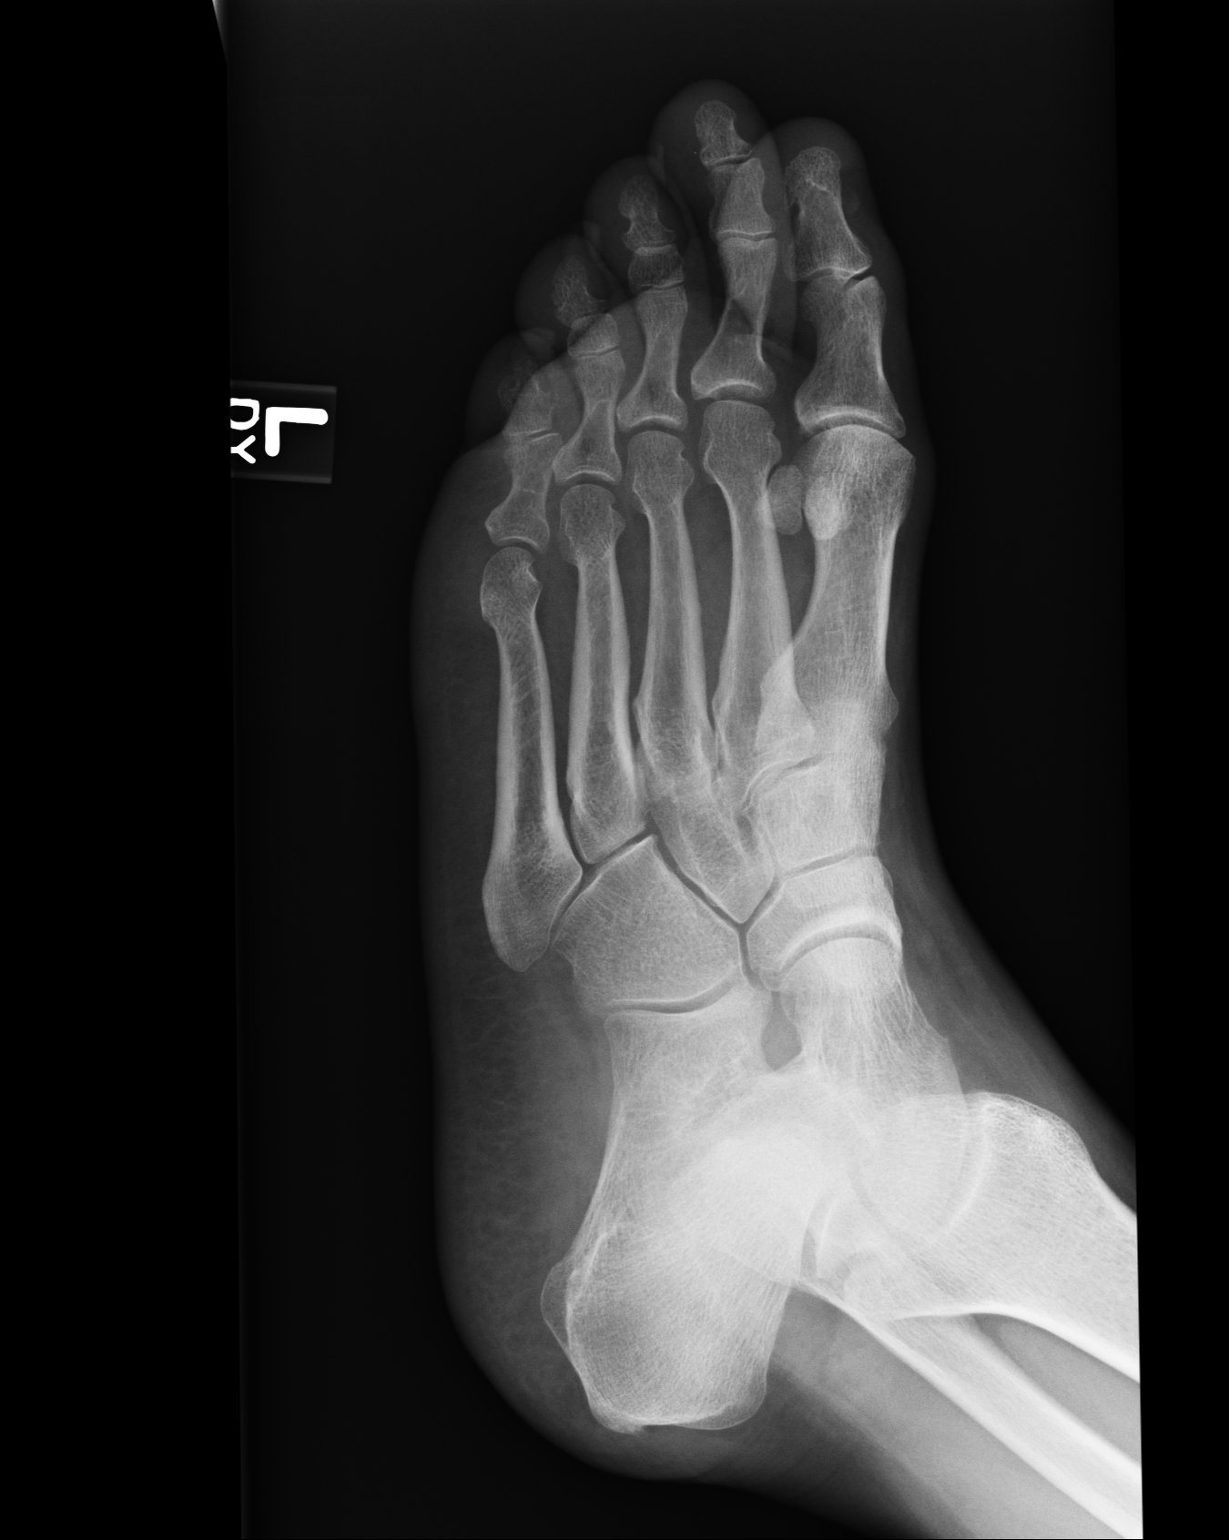
[im 3/3]
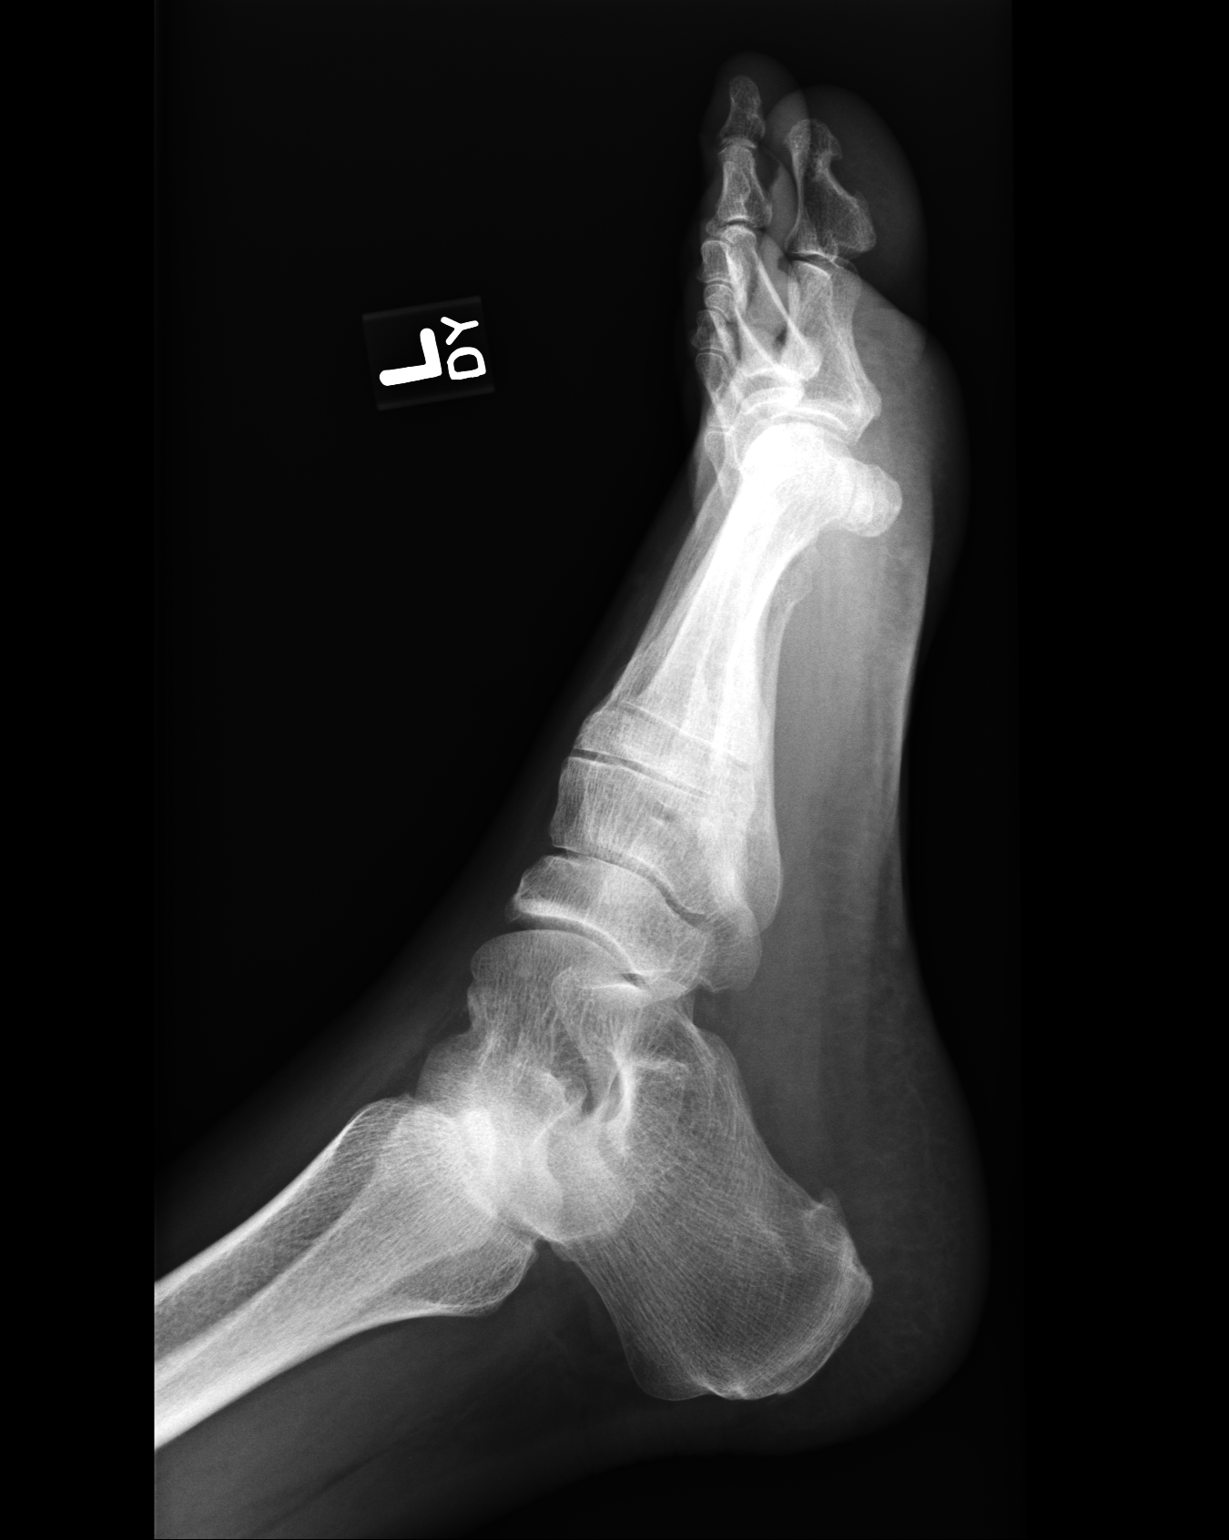

[3 of 3 positions shown; findings below may reference images not displayed]

IMPRESSION: No acute bony abnormalities are seen.

## 2008-06-16 ENCOUNTER — Other Ambulatory Visit: Payer: Self-pay

## 2008-06-17 ENCOUNTER — Inpatient Hospital Stay: Payer: Self-pay | Admitting: Internal Medicine

## 2008-09-08 ENCOUNTER — Emergency Department: Payer: Self-pay | Admitting: Emergency Medicine

## 2008-12-13 ENCOUNTER — Emergency Department: Payer: Self-pay | Admitting: Emergency Medicine

## 2009-01-07 ENCOUNTER — Emergency Department: Payer: Self-pay | Admitting: Emergency Medicine

## 2009-01-28 ENCOUNTER — Emergency Department: Payer: Self-pay | Admitting: Emergency Medicine

## 2009-03-04 ENCOUNTER — Ambulatory Visit: Payer: Self-pay | Admitting: Specialist

## 2009-05-25 ENCOUNTER — Emergency Department: Payer: Self-pay | Admitting: Emergency Medicine

## 2009-06-15 ENCOUNTER — Emergency Department: Payer: Self-pay | Admitting: Emergency Medicine

## 2010-04-29 IMAGING — CR DG CHEST 1V PORT
1 series · 1 of 1 positions shown · non-contrast
Comparison: 12/01/2007

REASON FOR EXAM: rm 19   chest pain
COMMENTS:

PROCEDURE:     DXR - DXR PORTABLE CHEST SINGLE VIEW  - June 17, 2008 [DATE]
RESULT:     History: Chest pain

[view not recorded]
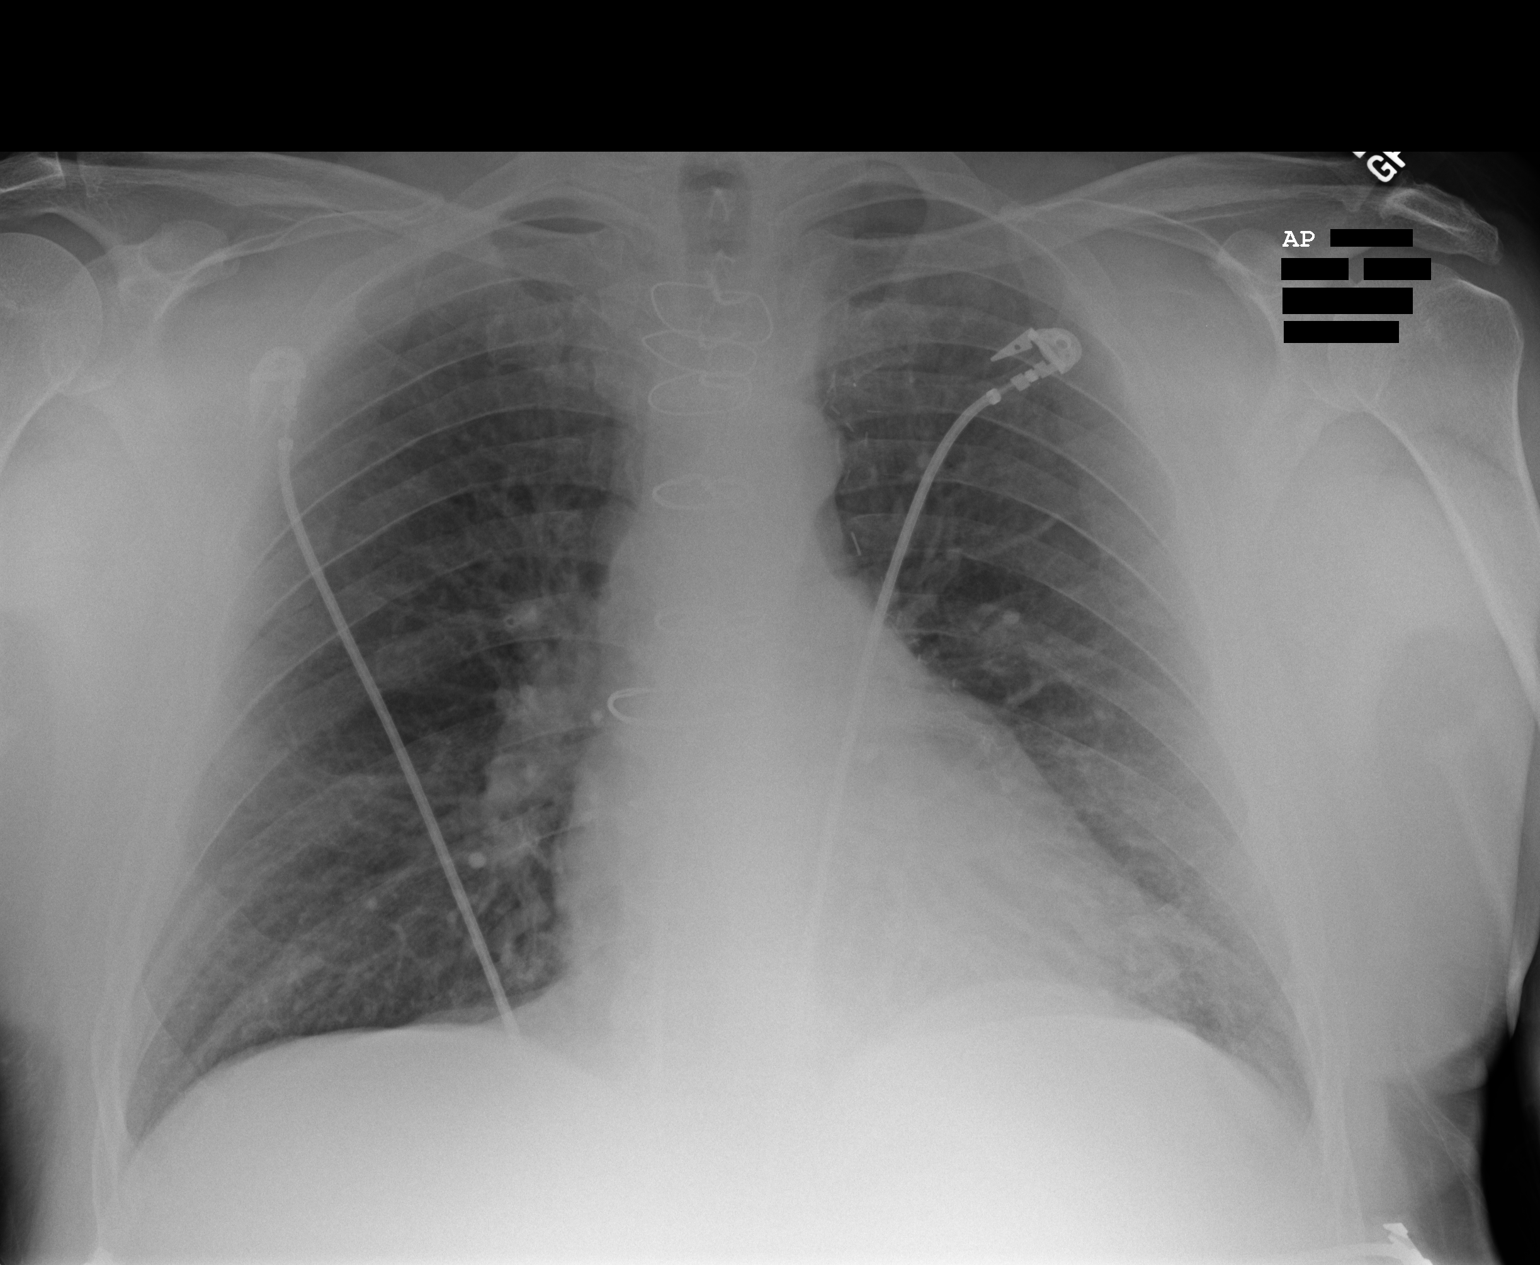

[1 of 1 positions shown; findings below may reference images not displayed]

FINDINGS: Single AP chest radiograph is provided. There is no focal parenchymal
opacity, pleural effusion, or pneumothorax. The heart size difficult to
evaluate or an AP radiograph, but is likely within normal limits. There is
evidence of prior median sternotomy. The osseous structures are unremarkable.
IMPRESSION: No acute disease of the chest.

## 2010-08-03 ENCOUNTER — Emergency Department: Payer: Self-pay | Admitting: Unknown Physician Specialty

## 2010-10-25 IMAGING — CR DG CHEST 2V
1 series · 2 of 2 positions shown · non-contrast
Comparison: none

REASON FOR EXAM: SOB
COMMENTS:

[Series 1: view not recorded · 0.17mm/px · 2 of 2 slices shown]
[im 1/2]
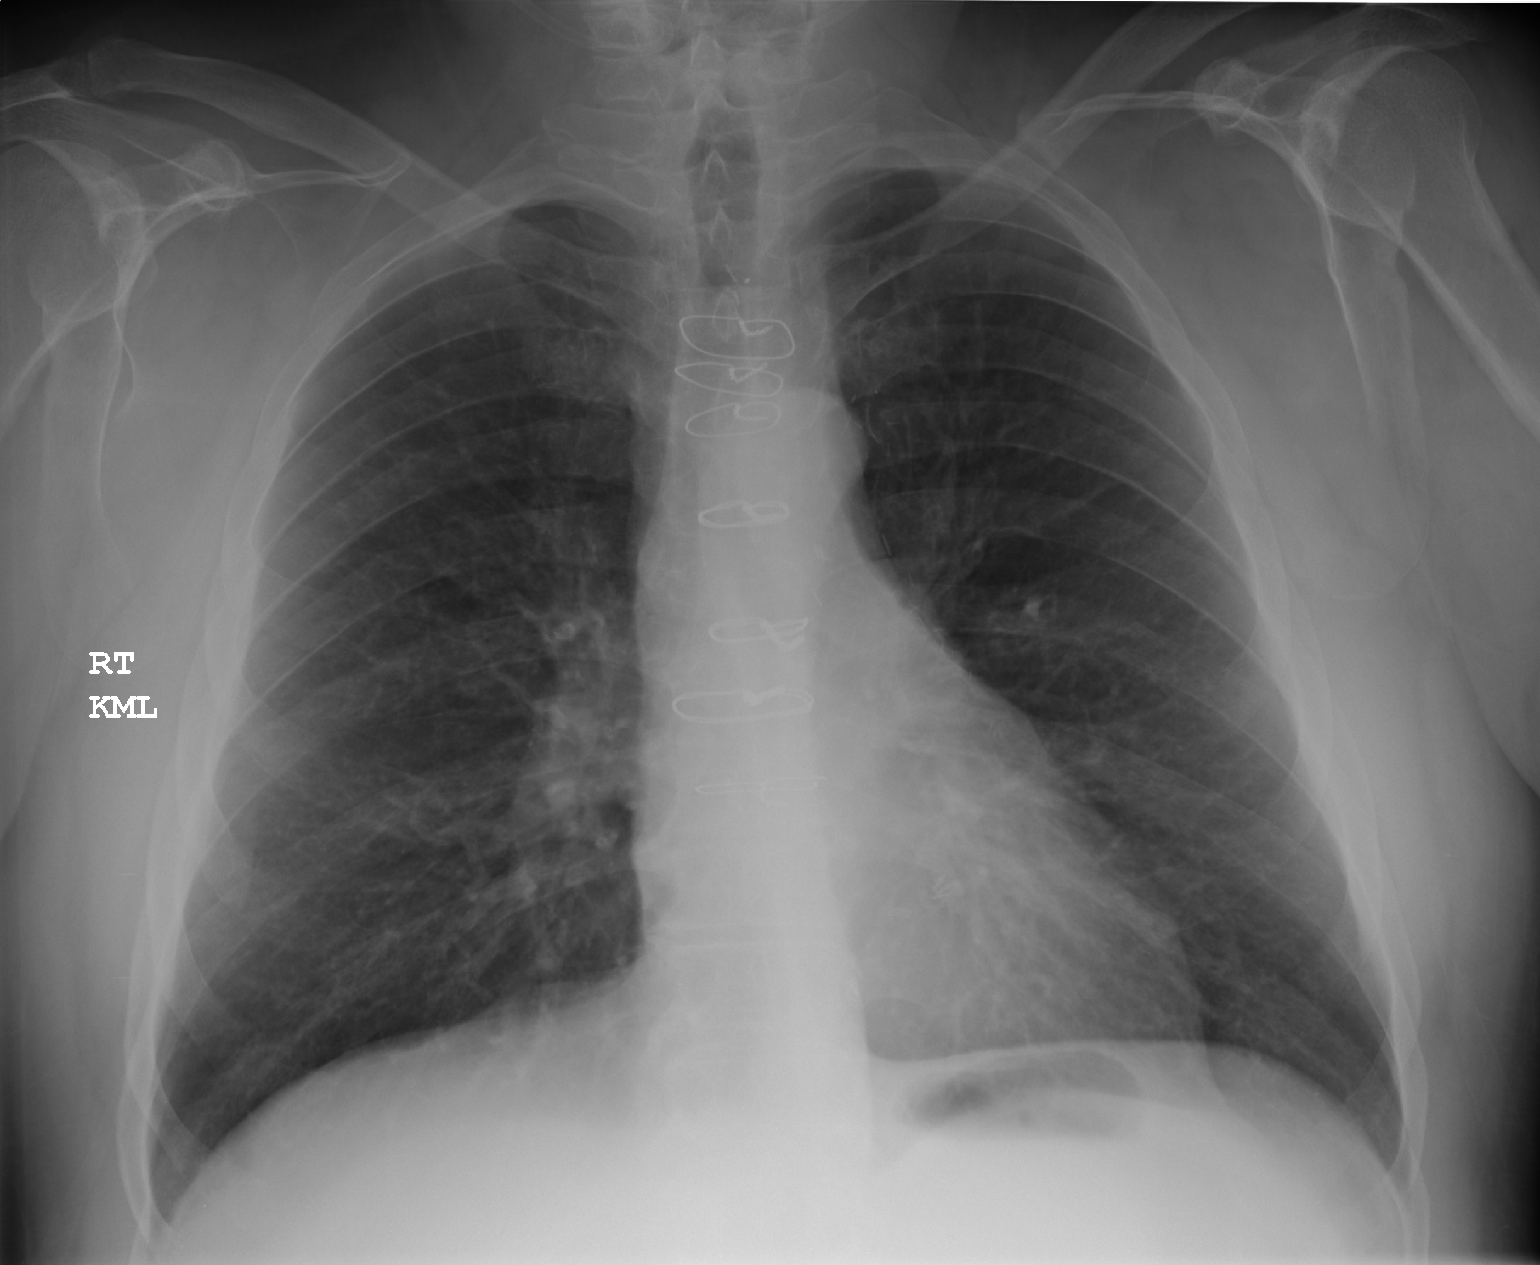
[im 2/2]
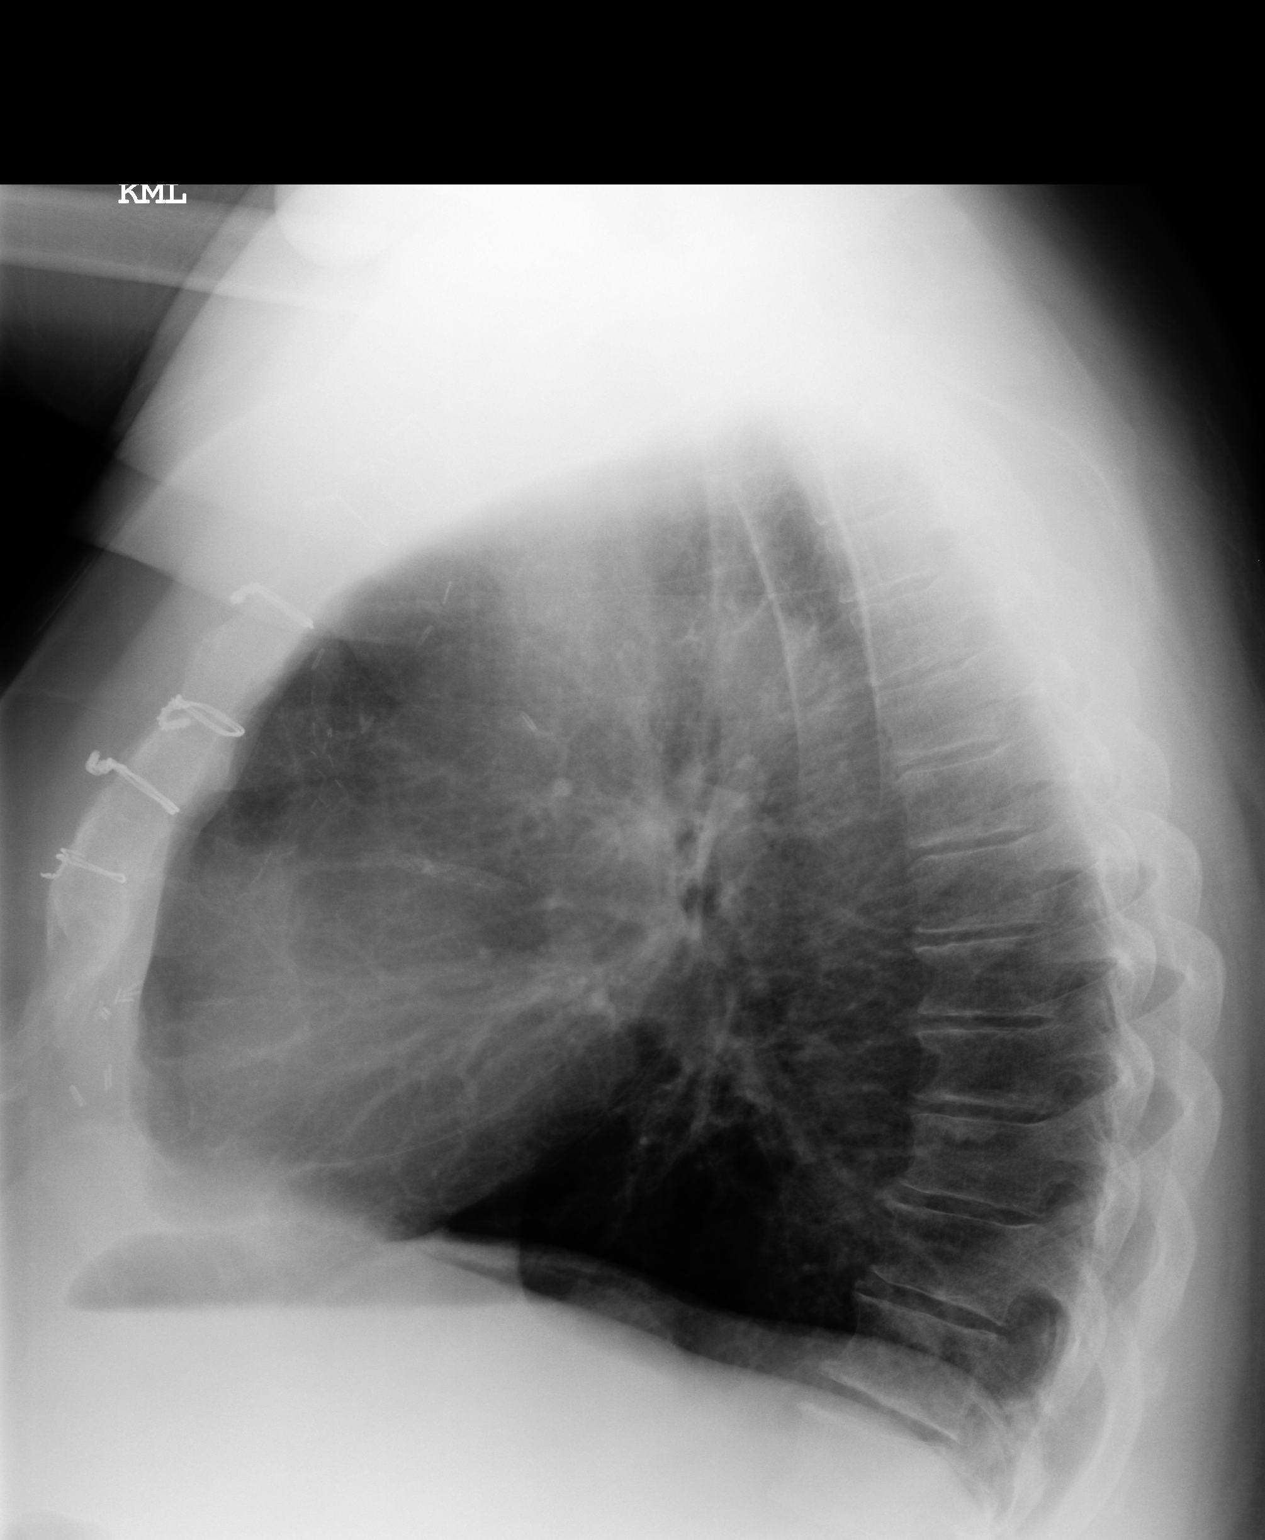

[2 of 2 positions shown; findings below may reference images not displayed]

PROCEDURE:     DXR - DXR CHEST PA (OR AP) AND LATERAL  - December 13, 2008  [DATE]

RESULT:     Comparison is made to a study 17 June, 2008.

The lungs are mildly hyperinflated. There is no focal infiltrate. The
cardiac silhouette is normal in size. The patient has undergone prior median
sternotomy. The perihilar interstitial markings are minimally prominent but
much improved over the prior study.
IMPRESSION: There are findings consistent with COPD or reactive airway
disease. I do not see evidence of CHF or pneumonia.

## 2010-12-10 IMAGING — CR DG SHOULDER 3+V*R*
1 series · 3 of 3 positions shown · non-contrast
Comparison: none

REASON FOR EXAM: pain after lifting bag of ice
COMMENTS:

PROCEDURE:     DXR - DXR SHOULDER RIGHT COMPLETE  - January 28, 2009  [DATE]
RESULT:     Three views of the shoulder were obtained. No fracture,
dislocation or other acute bony abnormality is identified.

[Series 1: view not recorded · 0.17mm/px · 3 of 3 slices shown]
[im 1/3]
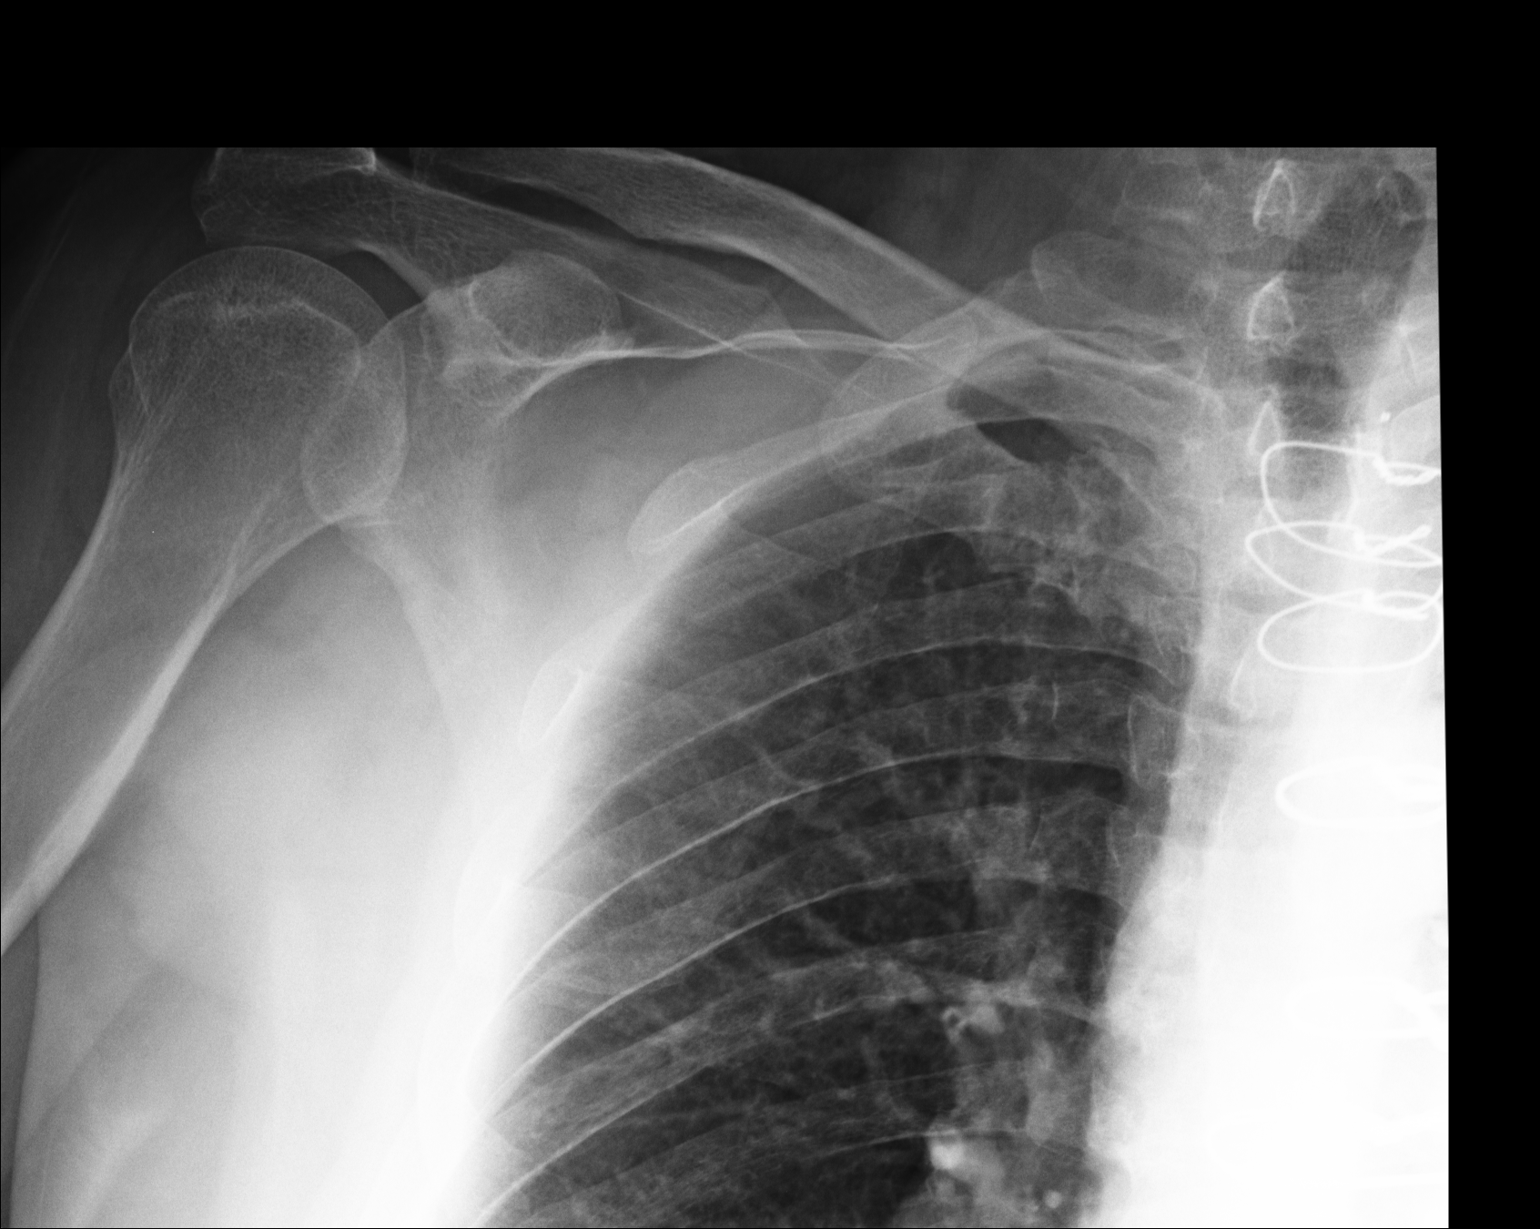
[im 2/3]
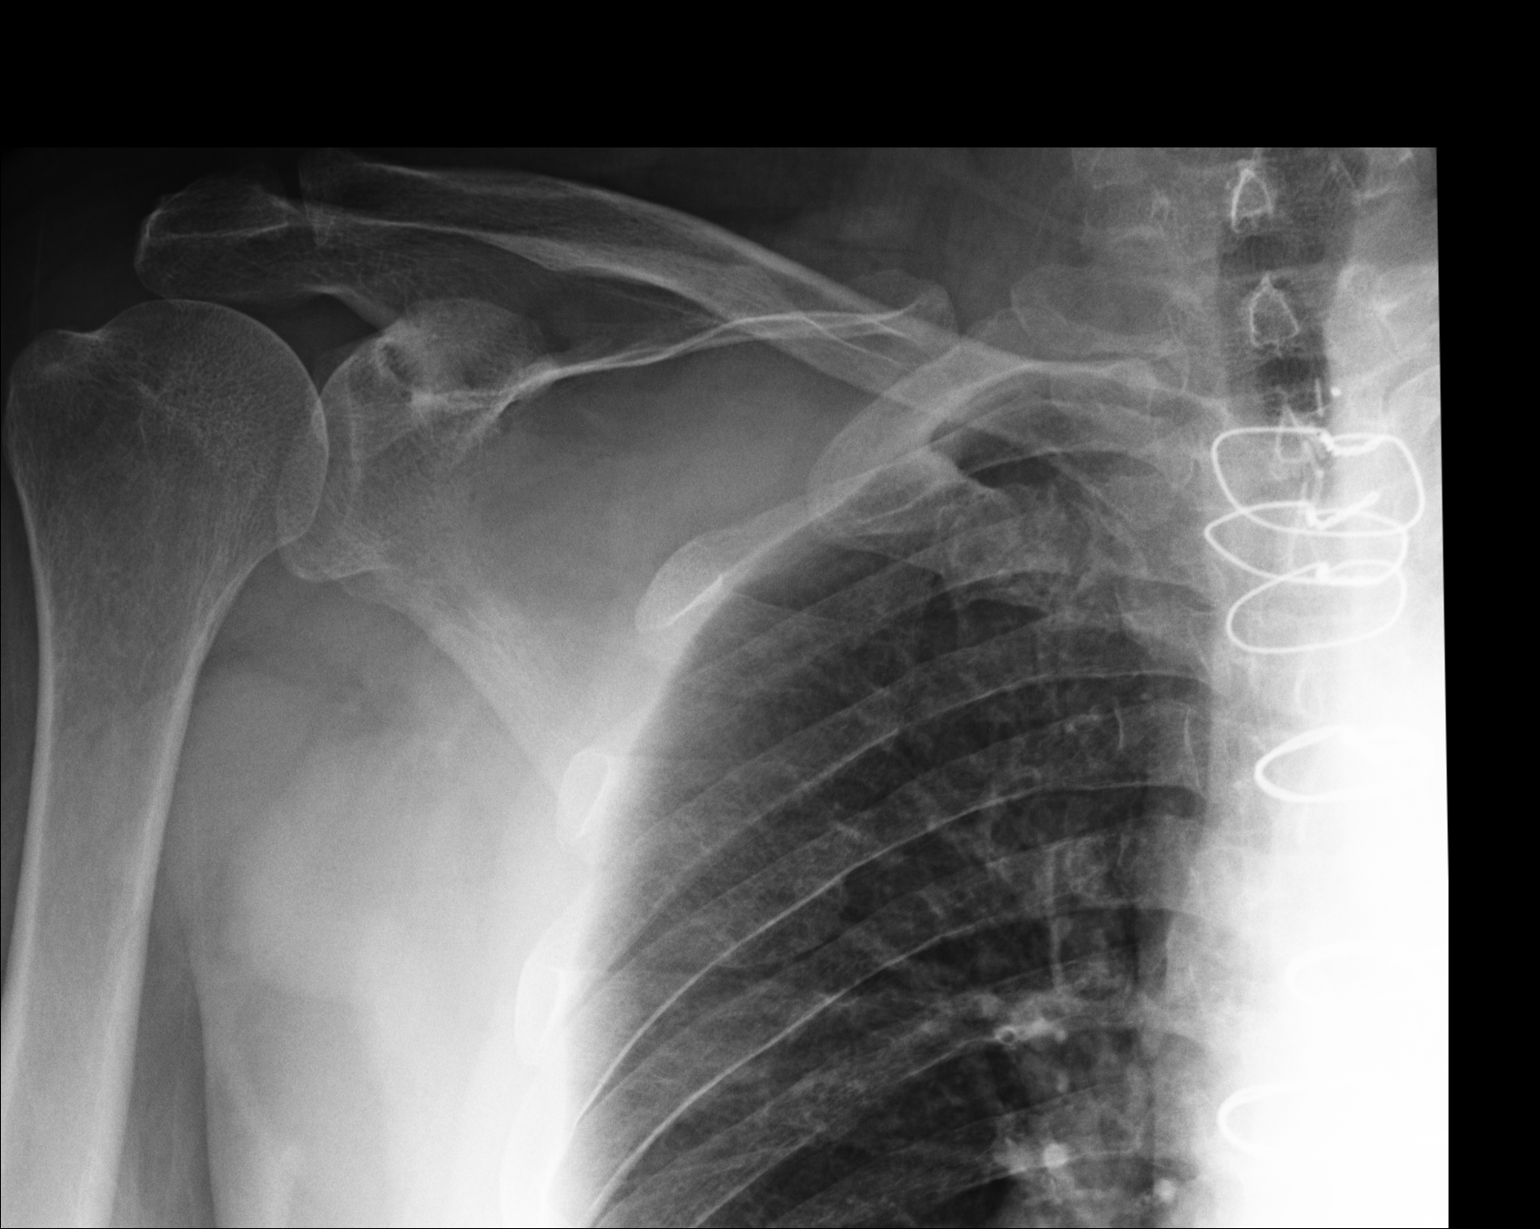
[im 3/3]
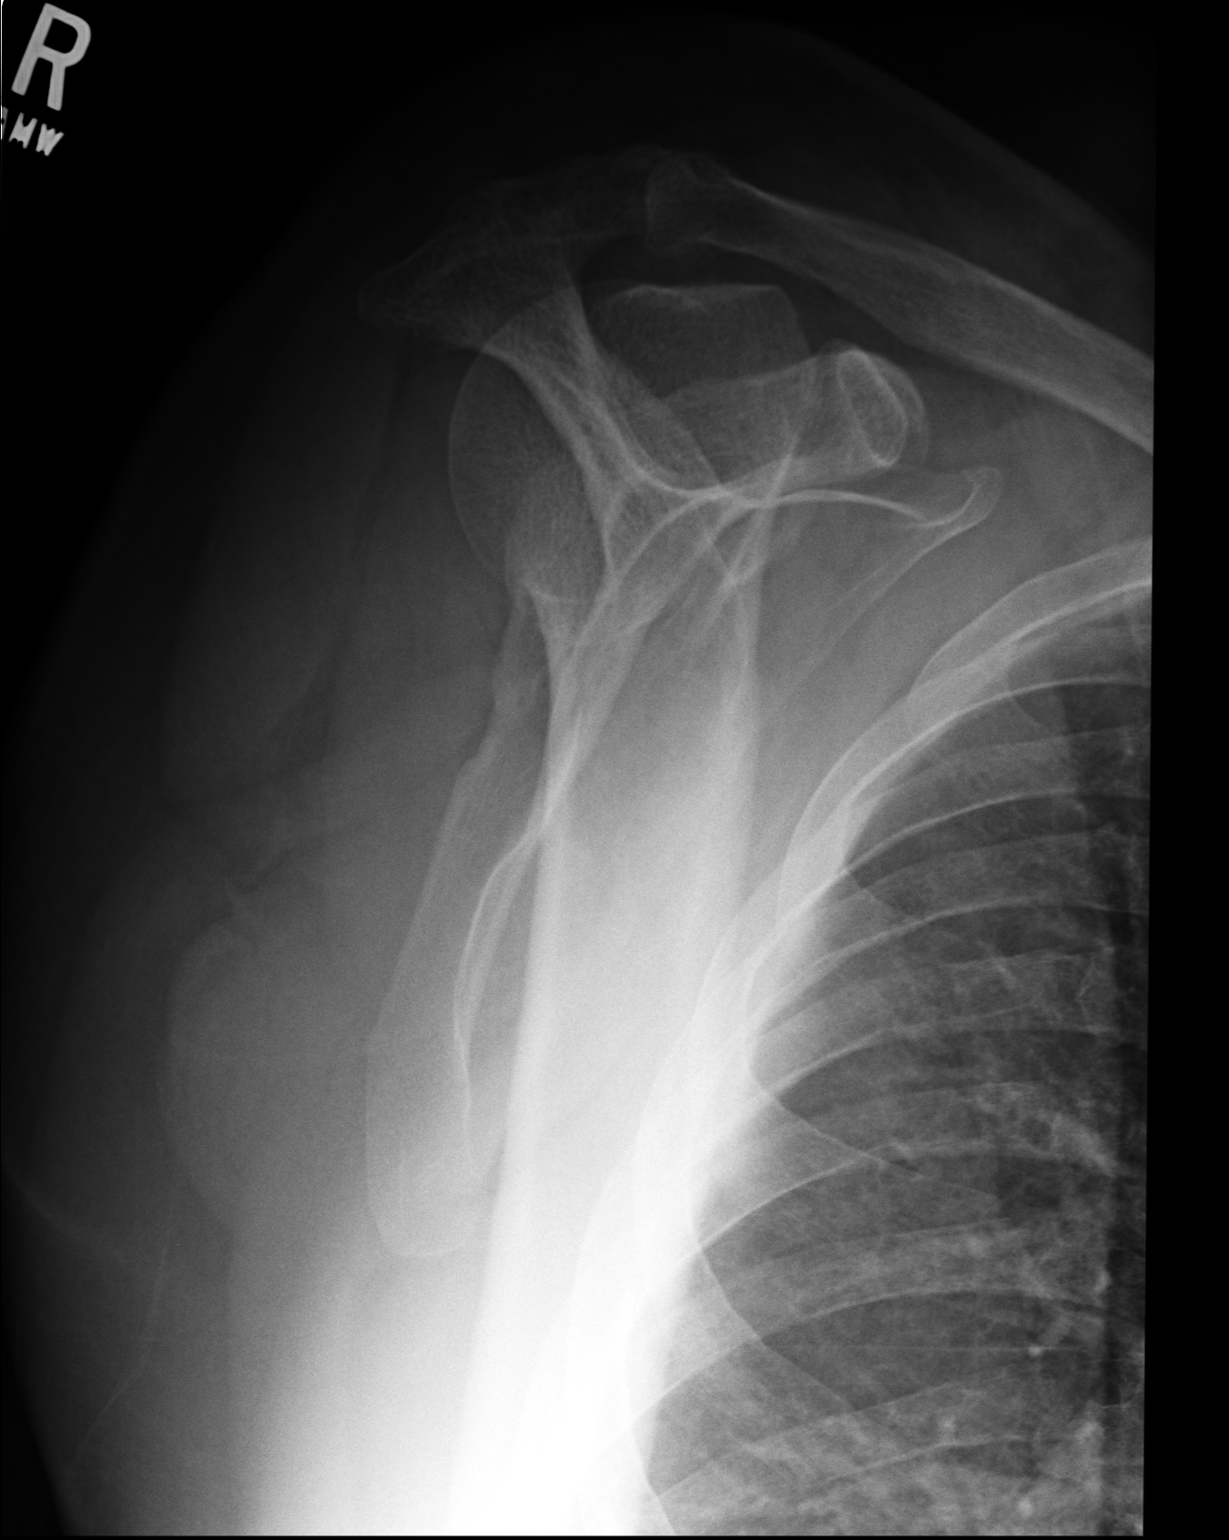

[3 of 3 positions shown; findings below may reference images not displayed]

IMPRESSION: No significant osseous abnormalities are noted.

## 2011-01-14 IMAGING — RF DG ARTHROGRAM SHOULDER*R*
1 series · 5 of 5 positions shown · non-contrast
Comparison: none

REASON FOR EXAM: Rt shoulder pain eval RCT
COMMENTS:

PROCEDURE:     FL  - FL SHOULDER ARTHROGRAM RIGHT  - March 04, 2009 [DATE]
RESULT:     Left shoulder arthrogram reveals no evidence of rotator cuff
tear. Acromioclavicular degenerative change is present. This can result in
impingement.

[Series 1: run · 5 of 5 slices shown]
[im 1/5]
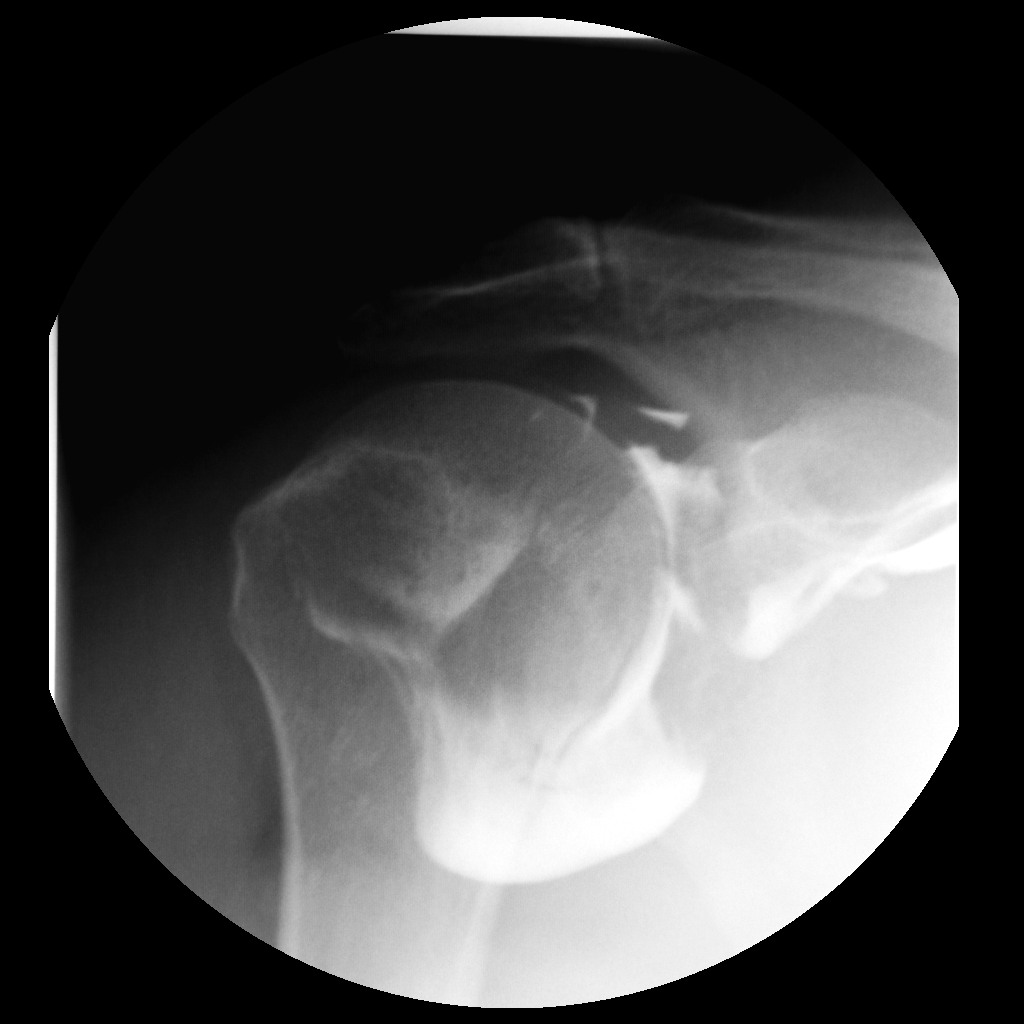
[im 2/5]
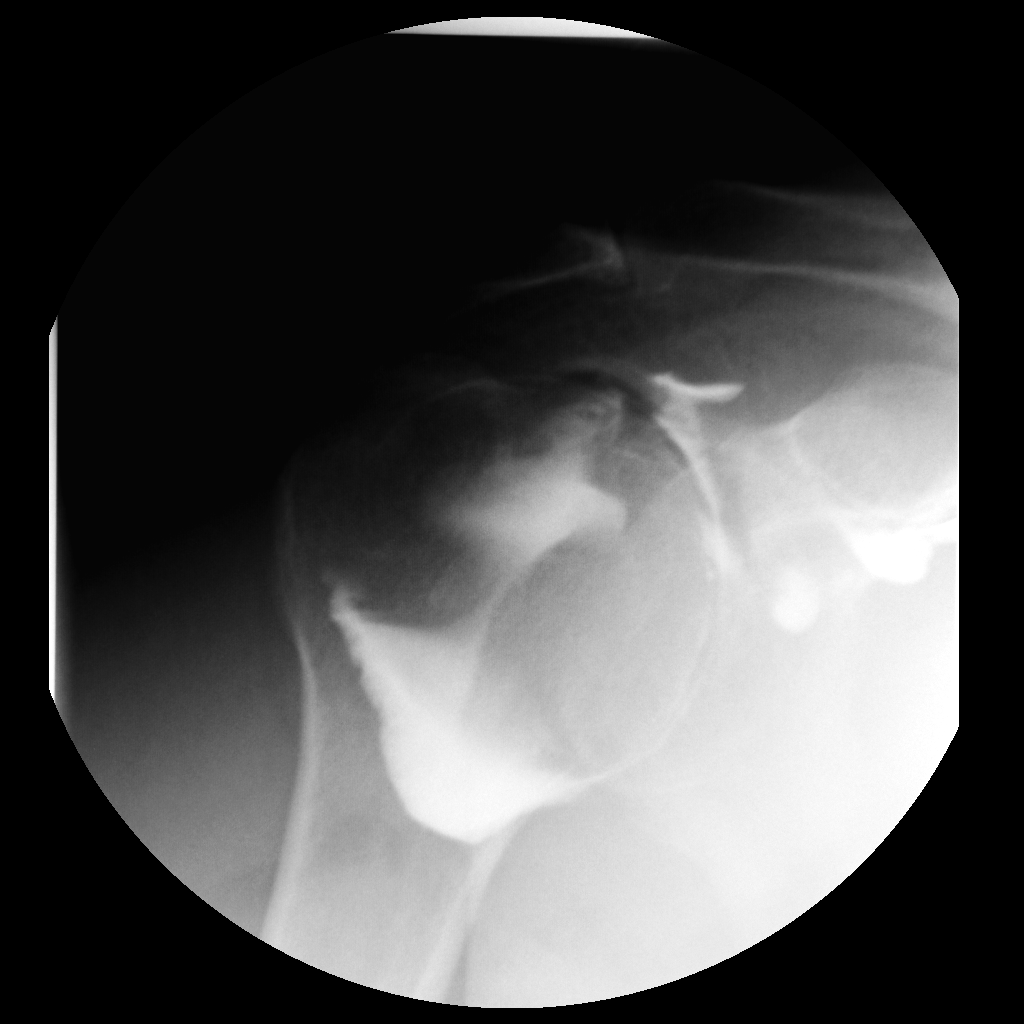
[im 3/5]
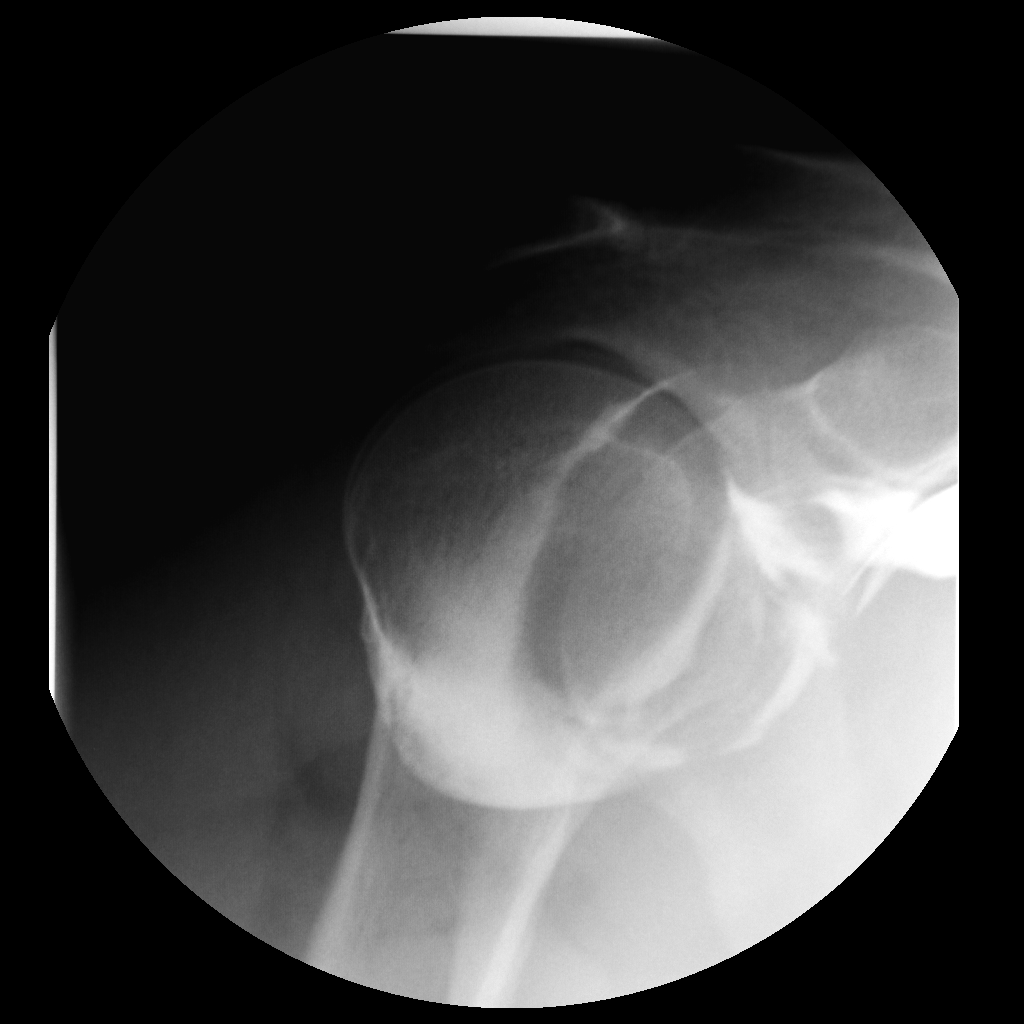
[im 4/5]
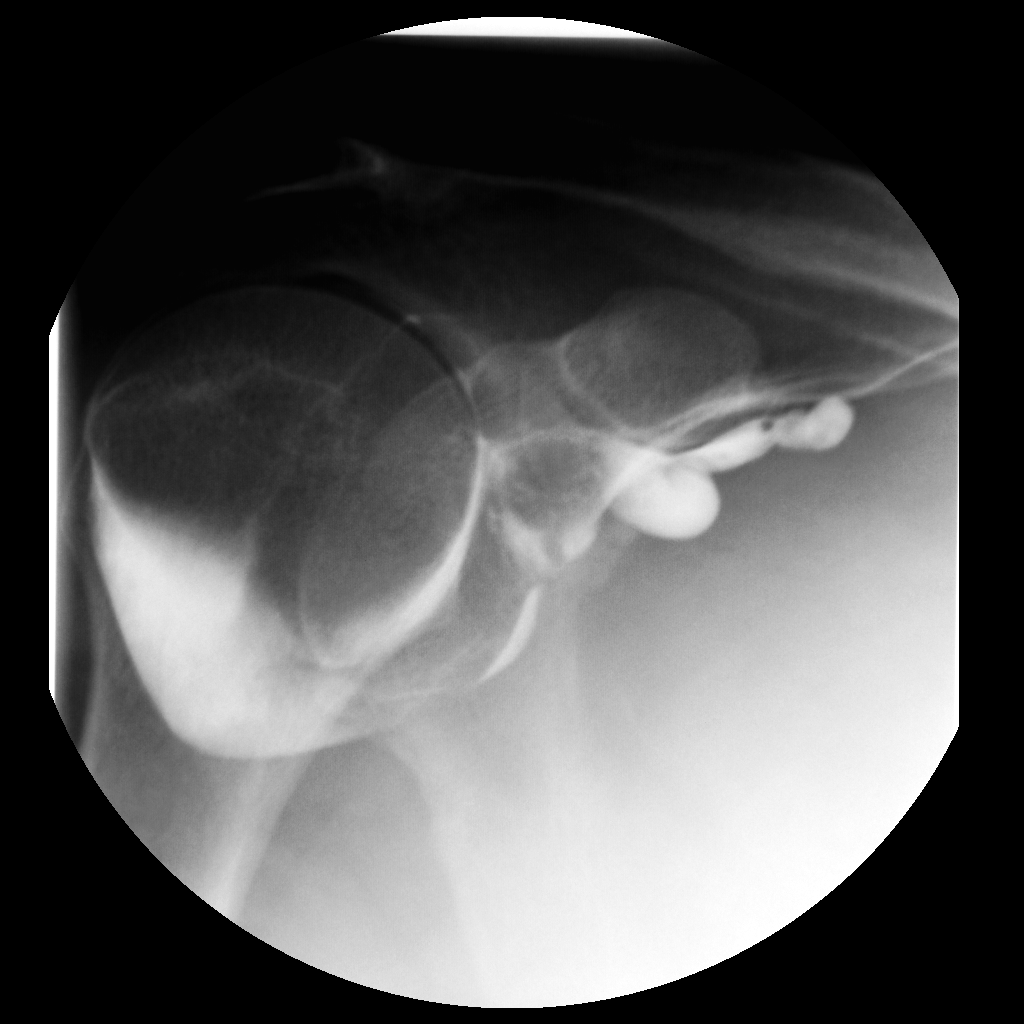
[im 5/5]
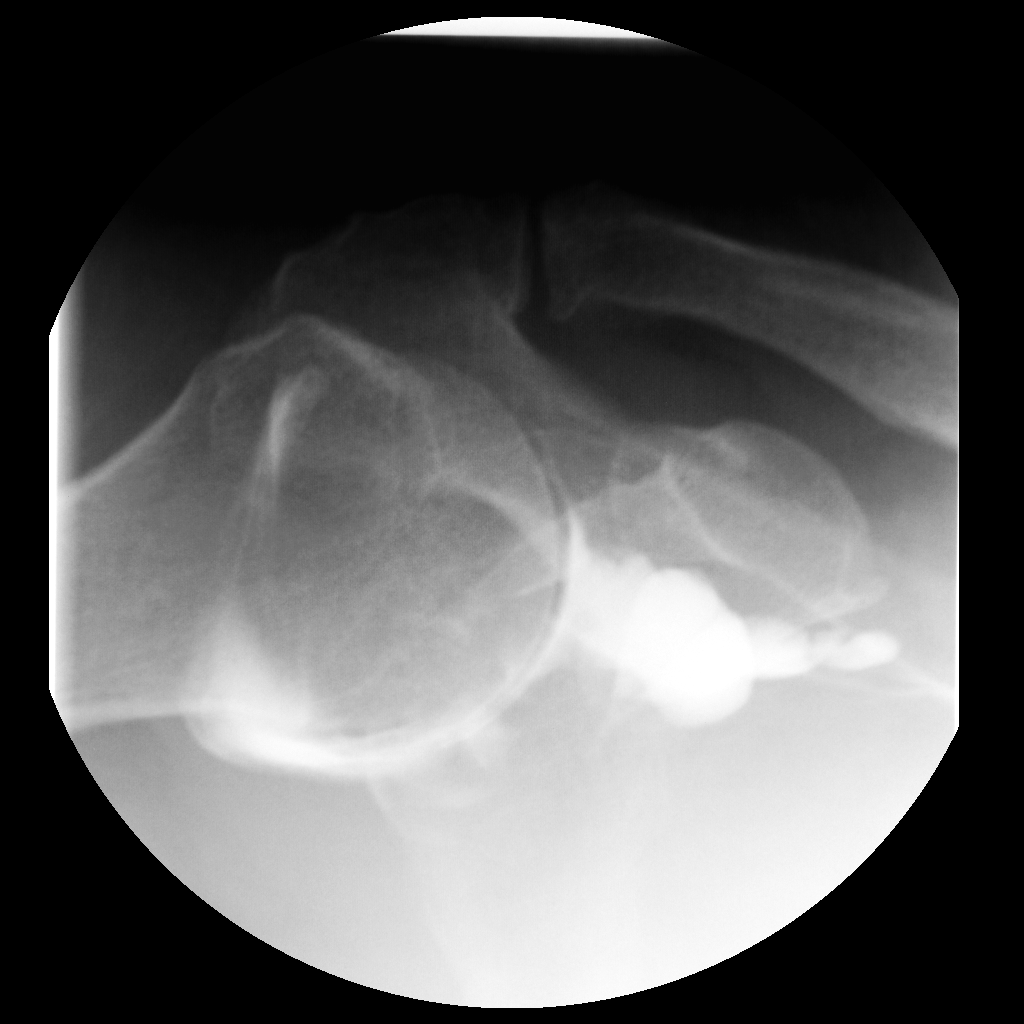

[5 of 5 positions shown; findings below may reference images not displayed]

IMPRESSION: 1.     Acromioclavicular degenerative change, mild. This can result in
impingement.
2.     There is no evidence of rotator cuff tear.

## 2011-02-07 ENCOUNTER — Emergency Department: Payer: Self-pay | Admitting: Emergency Medicine

## 2011-03-06 ENCOUNTER — Emergency Department: Payer: Self-pay | Admitting: Emergency Medicine

## 2011-04-06 IMAGING — CT CT ABD-PELV W/ CM
1 of 2 series · 15 of 32 positions shown, 19 images · non-contrast
Comparison: None

REASON FOR EXAM: (1) periumbilical pain w/ hx pancreatitis; (2)
periumbilical pain w/ hx pancreat
COMMENTS:

PROCEDURE:     CT  - CT ABDOMEN / PELVIS  W  - May 25, 2009 [DATE]
RESULT:     History: Periumbilical pain
TECHNIQUE: Multiple axial images of the abdomen and pelvis were performed
from the lung bases to the pubic symphysis, with p.o. contrast and with 100
mL of [id number] intravenous contrast.

[Series 2: appendicitis · axial · 0.87mm/px · z∈[+58,+466]mm · 15 of 150 slices shown, 19 images]
[im 7/150  soft-tissue]
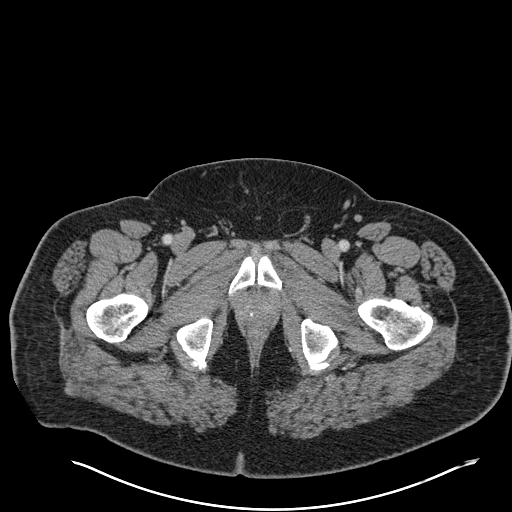
[im 7/150  bone]
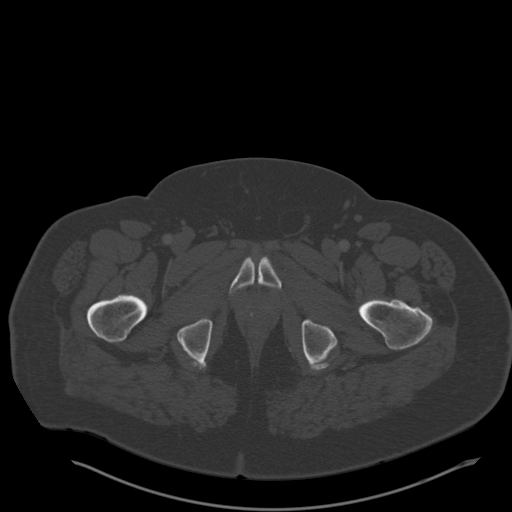
[im 19/150  soft-tissue]
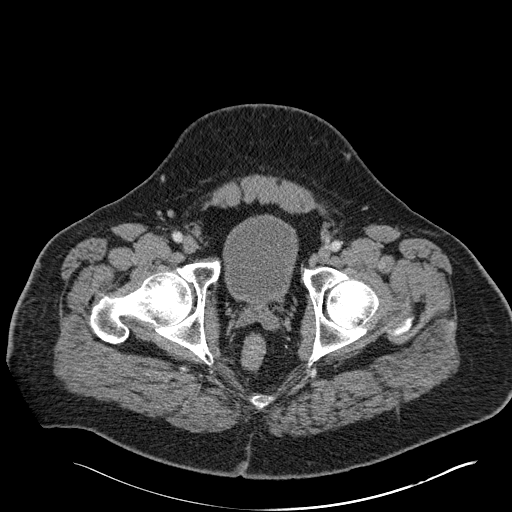
[im 32/150  soft-tissue]
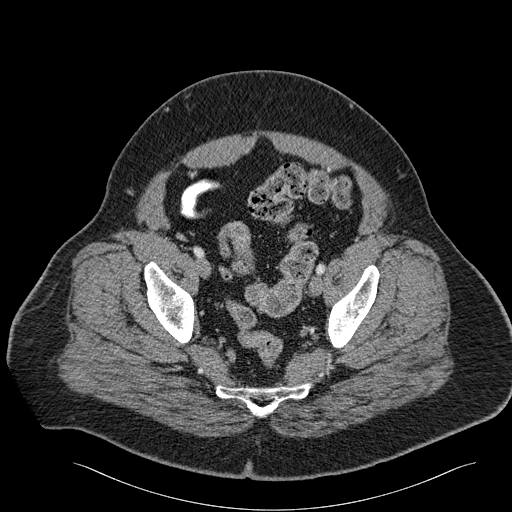
[im 44/150  soft-tissue]
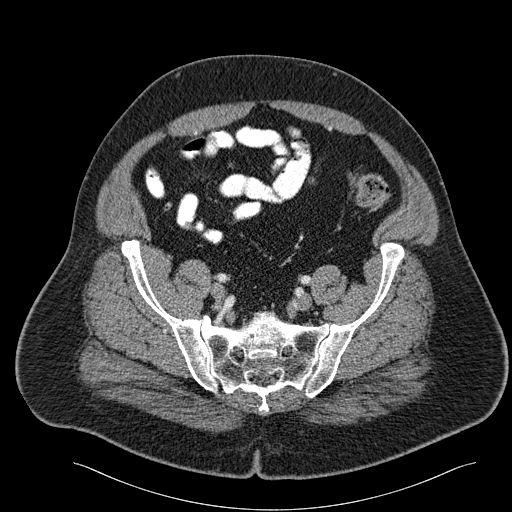
[im 50/150  soft-tissue]
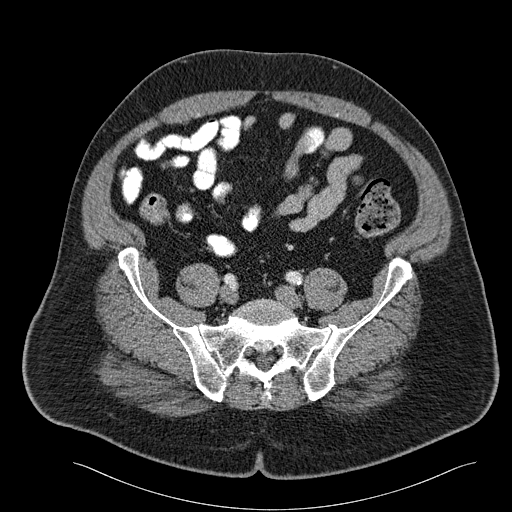
[im 63/150  soft-tissue]
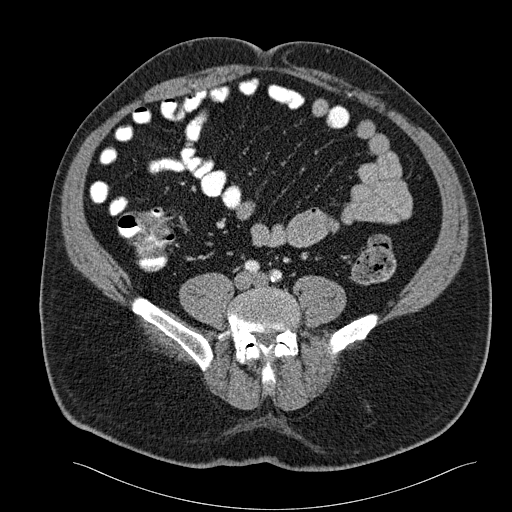
[im 75/150  soft-tissue]
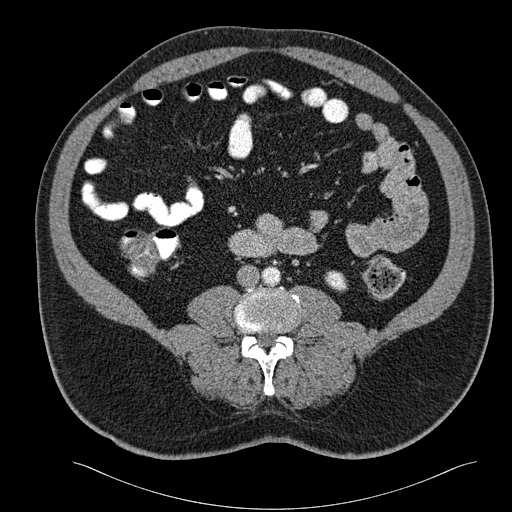
[im 87/150  soft-tissue]
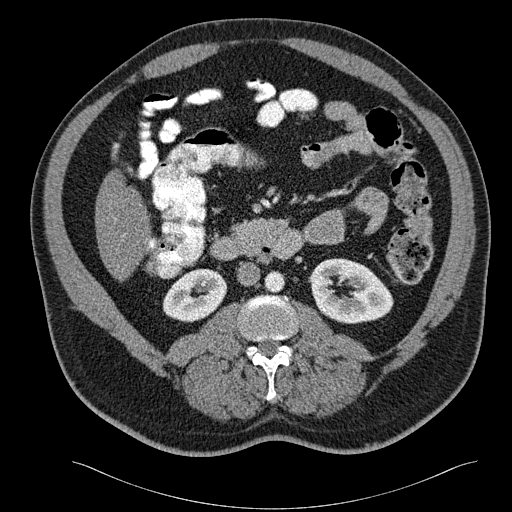
[im 100/150  soft-tissue]
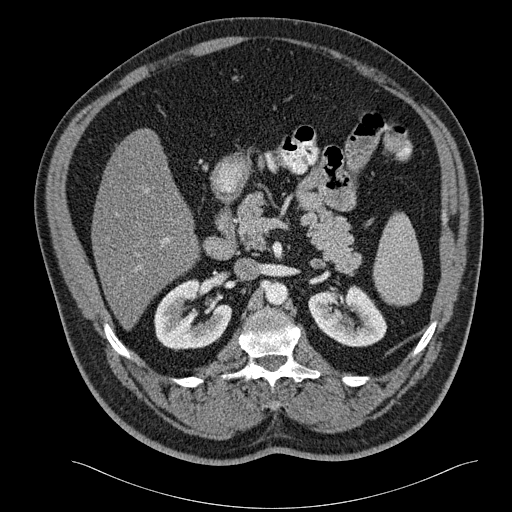
[im 100/150  bone]
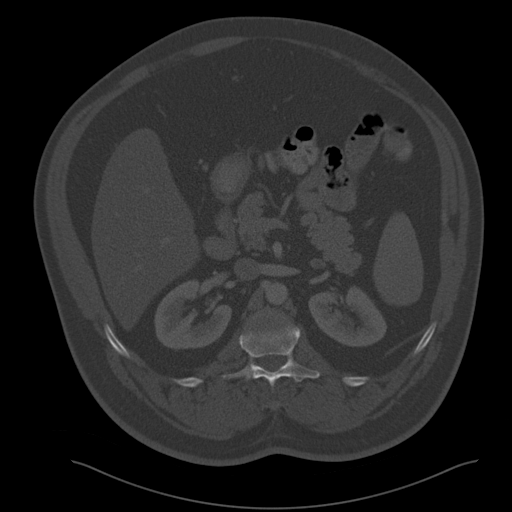
[im 106/150  soft-tissue]
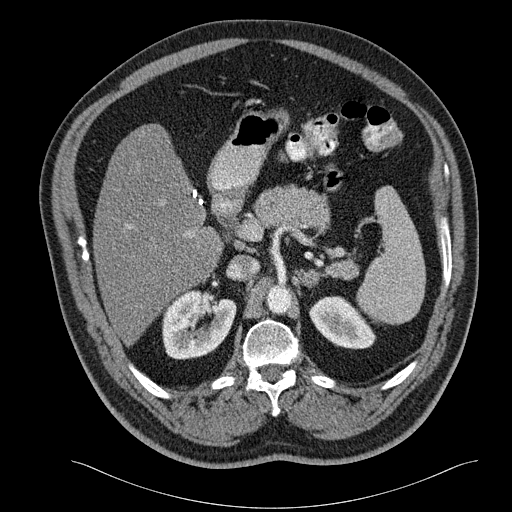
[im 118/150  soft-tissue]
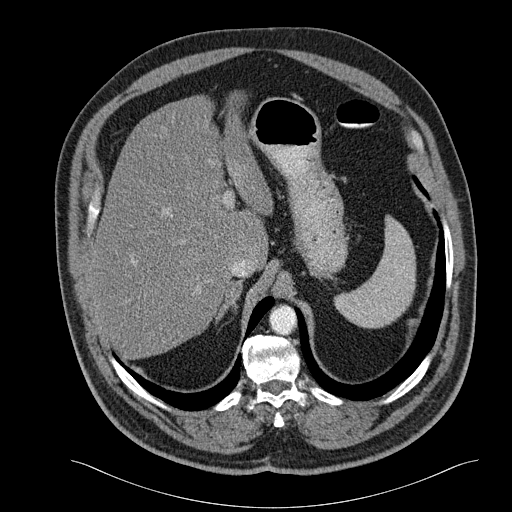
[im 125/150  lung]
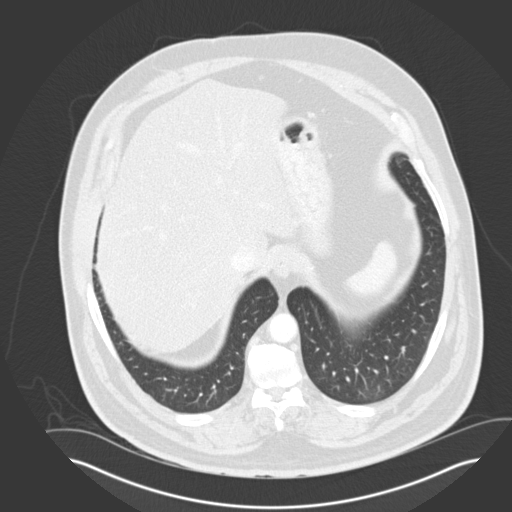
[im 131/150  soft-tissue]
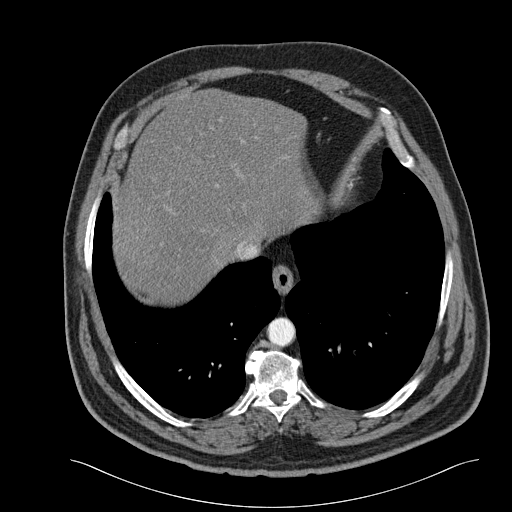
[im 131/150  lung]
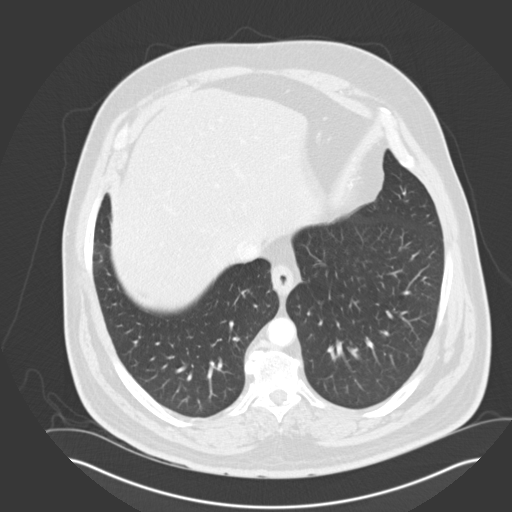
[im 137/150  lung]
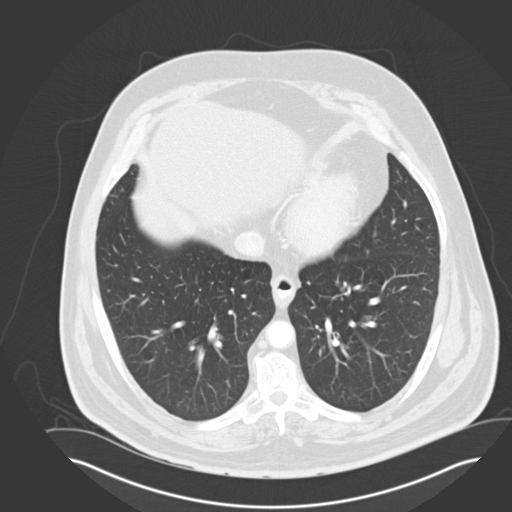
[im 143/150  soft-tissue]
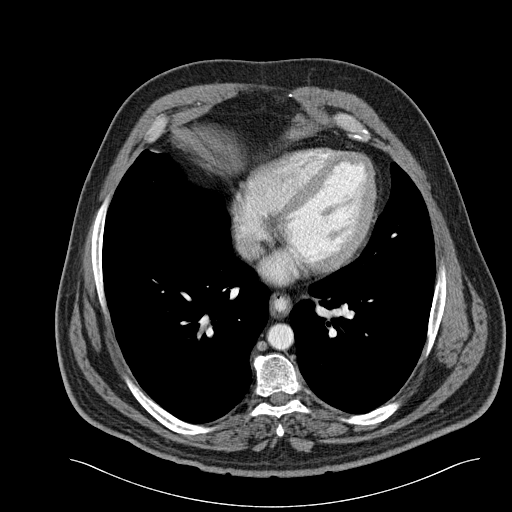
[im 143/150  lung]
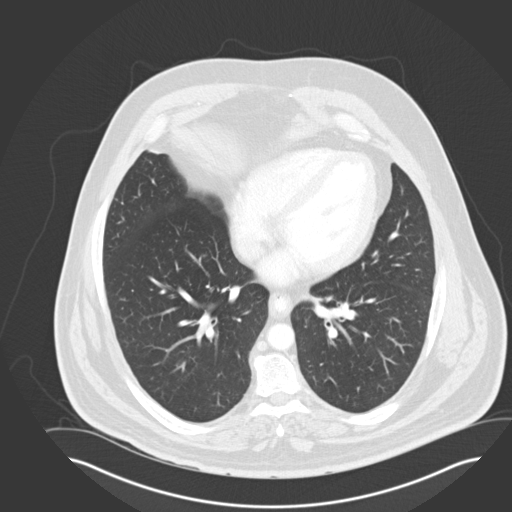

[15 of 32 positions shown; findings below may reference images not displayed]

FINDINGS: The lung bases are clear. There is no pneumothorax. The heart size is
normal.

The liver demonstrates no focal abnormality. There is no intrahepatic or
extrahepatic biliary ductal dilatation. The gallbladder is surgically
absent. The spleen demonstrates no focal abnormality. The kidneys, right
adrenal gland, and pancreas are normal. There is a left adrenal nodule
measuring 17 x 14 mm in which is incompletely characterized on this exam.
The bladder is unremarkable.

The stomach, duodenum, small intestine, and large intestine demonstrate no
contrast extravasation or dilatation. A normal caliber appendix is
visualized the right lower quadrant without periappendiceal inflammatory
changes. There is no pneumoperitoneum, pneumatosis, or portal venous gas.
There is no abdominal or pelvic free fluid. There is no lymphadenopathy.

The abdominal aorta is normal in caliber.

The osseous structures are unremarkable.
IMPRESSION: 1. No acute abdominal or pelvic pathology.
2. There is a left adrenal nodule measuring 17 x 14 mm in which is
incompletely characterized on this exam. Recommend followup MRI of the
abdomen in 6 months.

## 2011-07-03 ENCOUNTER — Emergency Department: Payer: Self-pay | Admitting: Emergency Medicine

## 2011-07-05 ENCOUNTER — Emergency Department: Payer: Self-pay | Admitting: Emergency Medicine

## 2011-07-07 ENCOUNTER — Inpatient Hospital Stay: Payer: Self-pay | Admitting: Internal Medicine

## 2011-10-22 ENCOUNTER — Emergency Department: Payer: Self-pay | Admitting: Unknown Physician Specialty

## 2011-12-21 ENCOUNTER — Emergency Department: Payer: Self-pay | Admitting: Emergency Medicine

## 2012-03-12 ENCOUNTER — Emergency Department: Payer: Self-pay | Admitting: Emergency Medicine

## 2012-06-14 IMAGING — CR DG CHEST 2V
1 series · 2 of 2 positions shown · non-contrast
Comparison: none

REASON FOR EXAM: SOB
COMMENTS:

PROCEDURE:     DXR - DXR CHEST PA (OR AP) AND LATERAL  - August 03, 2010  [DATE]
RESULT:     Comparison: 12/13/2008

[Series 1: view not recorded · 0.17mm/px · 2 of 2 slices shown]
[im 1/2]
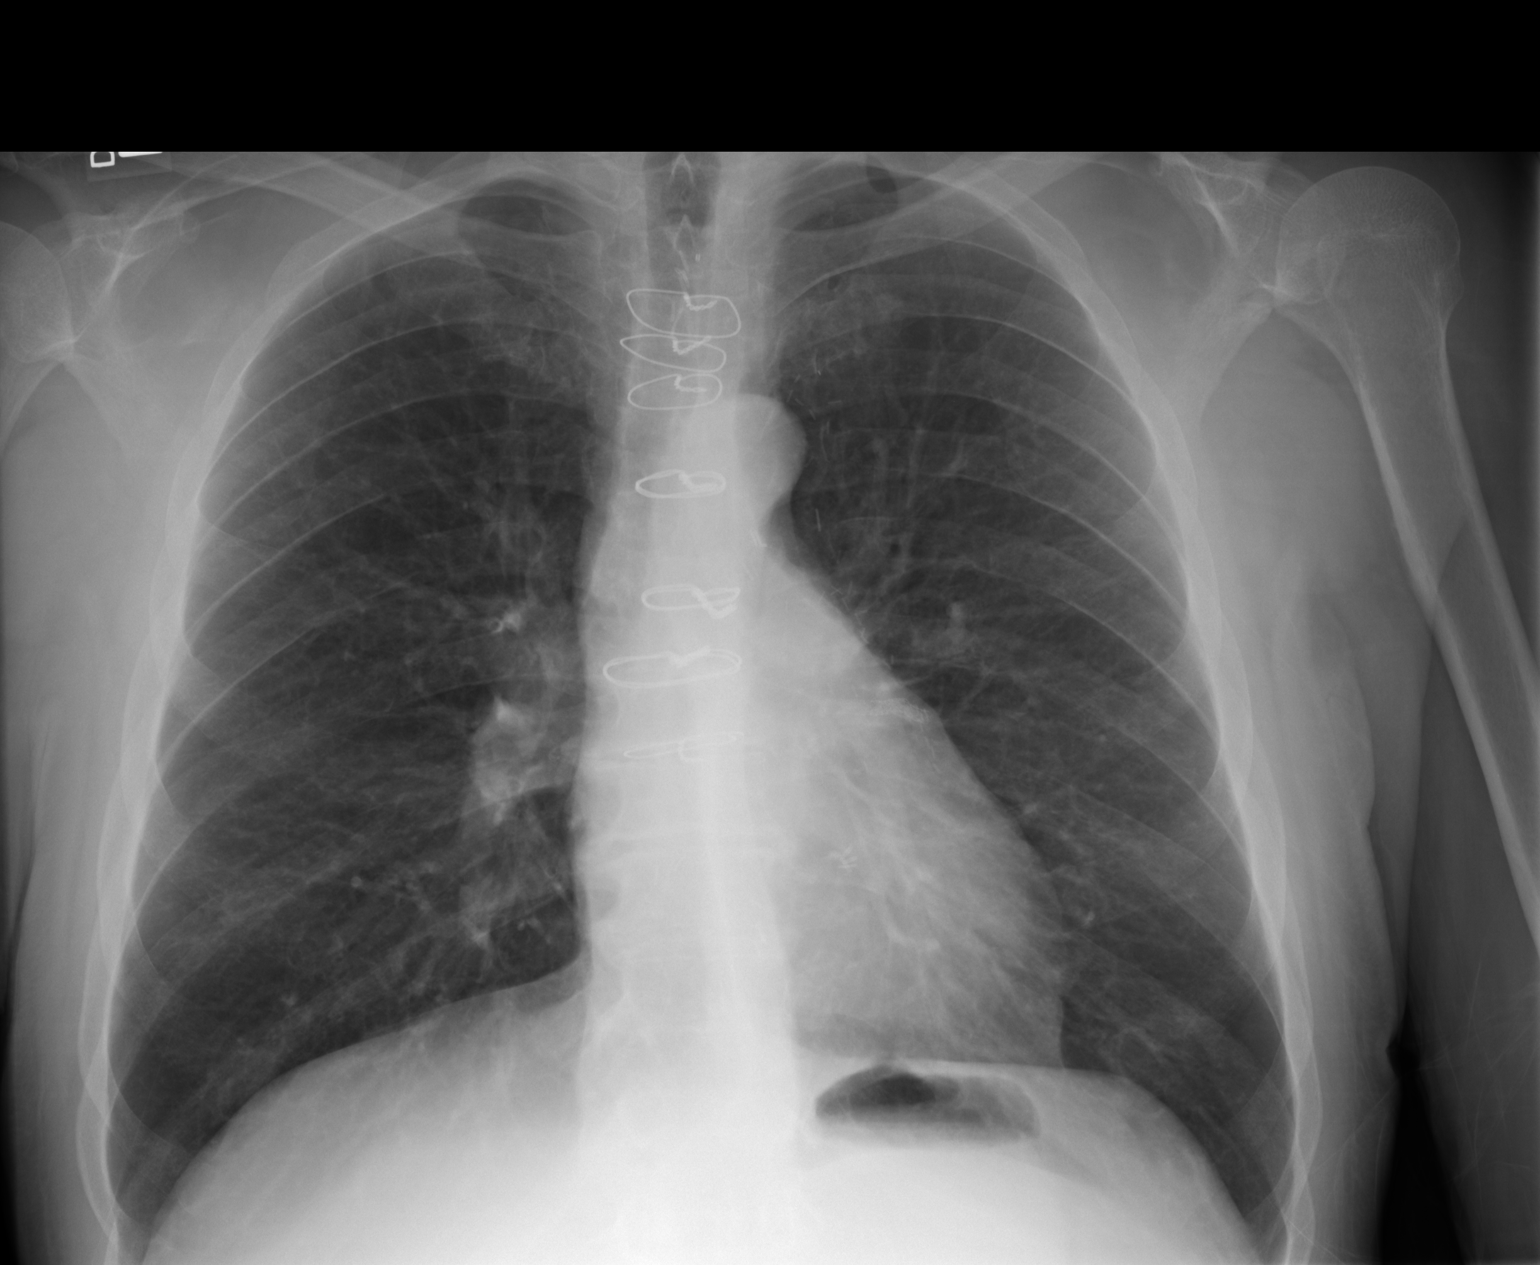
[im 2/2]
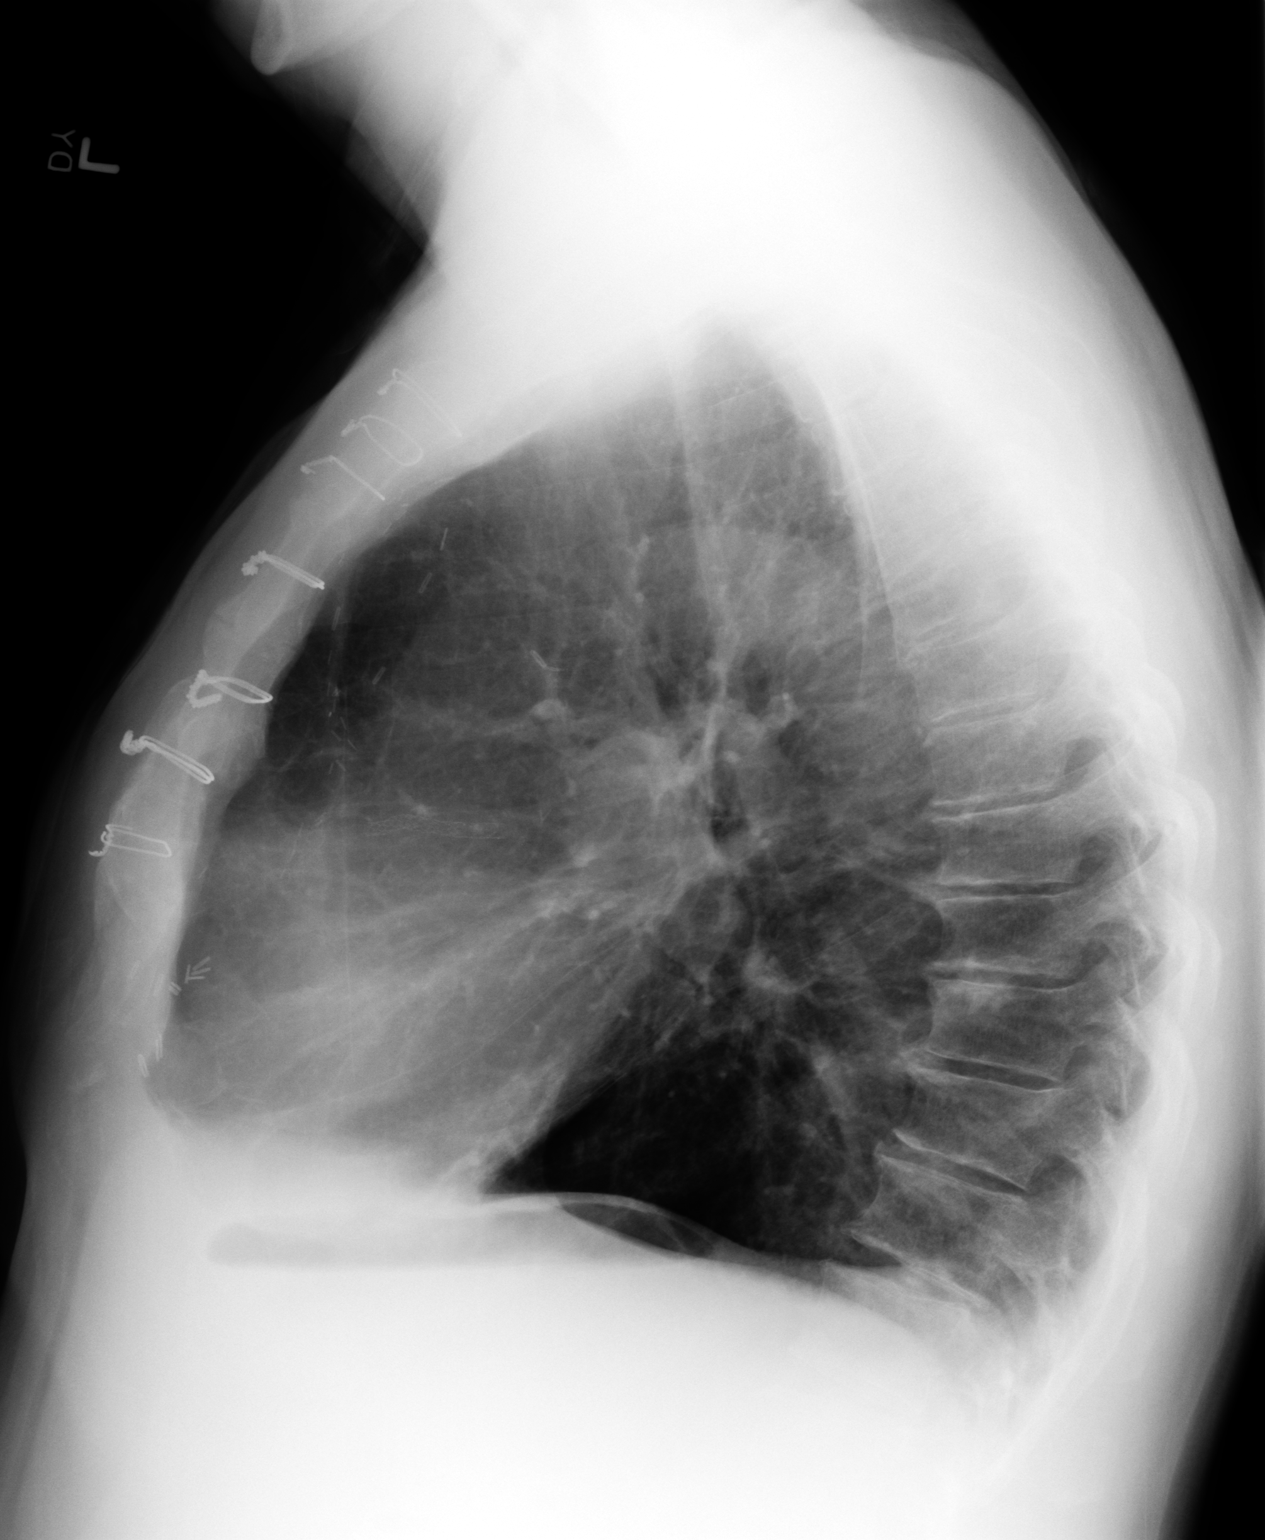

[2 of 2 positions shown; findings below may reference images not displayed]

FINDINGS: PA and lateral chest radiographs are provided.  There is no focal
parenchymal opacity, pleural effusion, or pneumothorax. The heart and
mediastinum are unremarkable. There is evidence of prior median sternotomy.
The osseous structures are unremarkable.
IMPRESSION: No acute disease of the chest.

## 2012-11-29 LAB — CK TOTAL AND CKMB (NOT AT ARMC)
CK, Total: 134 U/L (ref 35–232)
CK-MB: 2.2 ng/mL (ref 0.5–3.6)

## 2012-11-29 LAB — BASIC METABOLIC PANEL
Anion Gap: 4 — ABNORMAL LOW (ref 7–16)
BUN: 13 mg/dL (ref 7–18)
Calcium, Total: 9 mg/dL (ref 8.5–10.1)
Chloride: 106 mmol/L (ref 98–107)
Co2: 31 mmol/L (ref 21–32)
Creatinine: 0.79 mg/dL (ref 0.60–1.30)
EGFR (African American): >60
EGFR (Non-African Amer.): >60
Glucose: 112 mg/dL — ABNORMAL HIGH (ref 65–99)
Osmolality: 282 (ref 275–301)
Potassium: 3.6 mmol/L (ref 3.5–5.1)
Sodium: 141 mmol/L (ref 136–145)

## 2012-11-29 LAB — CBC
HCT: 48.5 % (ref 40.0–52.0)
HGB: 16.6 g/dL (ref 13.0–18.0)
MCH: 31.3 pg (ref 26.0–34.0)
MCHC: 34.1 g/dL (ref 32.0–36.0)
MCV: 92 fL (ref 80–100)
Platelet: 181 10*3/uL (ref 150–440)
RBC: 5.29 10*6/uL (ref 4.40–5.90)
RDW: 13.9 % (ref 11.5–14.5)
WBC: 8.2 10*3/uL (ref 3.8–10.6)

## 2012-11-29 LAB — TROPONIN I: Troponin-I: <0.02 ng/mL

## 2012-11-30 ENCOUNTER — Observation Stay: Payer: Self-pay | Admitting: Specialist

## 2012-11-30 DIAGNOSIS — R079 Chest pain, unspecified: Secondary | ICD-10-CM

## 2012-11-30 LAB — CK TOTAL AND CKMB (NOT AT ARMC)
CK, Total: 74 U/L (ref 35–232)
CK-MB: 1.2 ng/mL (ref 0.5–3.6)

## 2012-11-30 LAB — CBC WITH DIFFERENTIAL/PLATELET
Basophil #: 0 10*3/uL (ref 0.0–0.1)
Basophil %: 0.7 %
Eosinophil #: 0.3 10*3/uL (ref 0.0–0.7)
Eosinophil %: 4 %
HCT: 45.9 % (ref 40.0–52.0)
HGB: 15.6 g/dL (ref 13.0–18.0)
Lymphocyte #: 2.4 10*3/uL (ref 1.0–3.6)
Lymphocyte %: 33.4 %
MCH: 31.4 pg (ref 26.0–34.0)
MCHC: 34 g/dL (ref 32.0–36.0)
MCV: 92 fL (ref 80–100)
Monocyte #: 0.5 x10 3/mm (ref 0.2–1.0)
Monocyte %: 7.6 %
Neutrophil #: 3.9 10*3/uL (ref 1.4–6.5)
Neutrophil %: 54.3 %
Platelet: 162 10*3/uL (ref 150–440)
RBC: 4.98 10*6/uL (ref 4.40–5.90)
RDW: 13.9 % (ref 11.5–14.5)
WBC: 7.2 10*3/uL (ref 3.8–10.6)

## 2012-11-30 LAB — TROPONIN I: Troponin-I: <0.02 ng/mL

## 2012-11-30 LAB — APTT
Activated PTT: 45.7 secs — ABNORMAL HIGH (ref 23.6–35.9)
Activated PTT: 83.5 secs — ABNORMAL HIGH (ref 23.6–35.9)

## 2013-04-09 DIAGNOSIS — E782 Mixed hyperlipidemia: Secondary | ICD-10-CM | POA: Insufficient documentation

## 2013-04-09 DIAGNOSIS — E785 Hyperlipidemia, unspecified: Secondary | ICD-10-CM | POA: Insufficient documentation

## 2013-04-11 DIAGNOSIS — Z87891 Personal history of nicotine dependence: Secondary | ICD-10-CM | POA: Insufficient documentation

## 2013-07-04 ENCOUNTER — Emergency Department: Payer: Self-pay | Admitting: Emergency Medicine

## 2013-07-04 LAB — CBC
HCT: 43.1 % (ref 40.0–52.0)
HGB: 14.9 g/dL (ref 13.0–18.0)
MCH: 31.1 pg (ref 26.0–34.0)
MCHC: 34.6 g/dL (ref 32.0–36.0)
MCV: 90 fL (ref 80–100)
Platelet: 151 10*3/uL (ref 150–440)
RBC: 4.79 10*6/uL (ref 4.40–5.90)
RDW: 13.9 % (ref 11.5–14.5)
WBC: 10.2 10*3/uL (ref 3.8–10.6)

## 2013-07-04 LAB — COMPREHENSIVE METABOLIC PANEL
Albumin: 3.6 g/dL (ref 3.4–5.0)
Alkaline Phosphatase: 67 U/L (ref 50–136)
Anion Gap: 4 — ABNORMAL LOW (ref 7–16)
BUN: 13 mg/dL (ref 7–18)
Bilirubin,Total: 0.4 mg/dL (ref 0.2–1.0)
Calcium, Total: 9.4 mg/dL (ref 8.5–10.1)
Chloride: 100 mmol/L (ref 98–107)
Co2: 33 mmol/L — ABNORMAL HIGH (ref 21–32)
Creatinine: 0.98 mg/dL (ref 0.60–1.30)
EGFR (African American): >60
EGFR (Non-African Amer.): >60
Glucose: 133 mg/dL — ABNORMAL HIGH (ref 65–99)
Osmolality: 276 (ref 275–301)
Potassium: 4.1 mmol/L (ref 3.5–5.1)
SGOT(AST): 21 U/L (ref 15–37)
SGPT (ALT): 33 U/L (ref 12–78)
Sodium: 137 mmol/L (ref 136–145)
Total Protein: 6.9 g/dL (ref 6.4–8.2)

## 2013-07-04 LAB — URINALYSIS, COMPLETE
Bacteria: NONE SEEN
Bilirubin,UR: NEGATIVE
Glucose,UR: NEGATIVE mg/dL (ref 0–75)
Ketone: NEGATIVE
Leukocyte Esterase: NEGATIVE
Nitrite: NEGATIVE
Ph: 5 (ref 4.5–8.0)
Protein: NEGATIVE
RBC,UR: NONE SEEN /HPF (ref 0–5)
Specific Gravity: 1.006 (ref 1.003–1.030)
Squamous Epithelial: NONE SEEN
WBC UR: 1 /HPF (ref 0–5)

## 2013-07-04 LAB — CK TOTAL AND CKMB (NOT AT ARMC)
CK, Total: 114 U/L (ref 35–232)
CK-MB: 1.8 ng/mL (ref 0.5–3.6)

## 2013-07-04 LAB — TROPONIN I: Troponin-I: <0.02 ng/mL

## 2013-07-27 ENCOUNTER — Emergency Department: Payer: Self-pay | Admitting: Emergency Medicine

## 2013-08-12 ENCOUNTER — Emergency Department: Payer: Self-pay | Admitting: Internal Medicine

## 2013-08-12 LAB — COMPREHENSIVE METABOLIC PANEL
Albumin: 3.5 g/dL (ref 3.4–5.0)
Alkaline Phosphatase: 59 U/L (ref 50–136)
Anion Gap: 5 — ABNORMAL LOW (ref 7–16)
BUN: 13 mg/dL (ref 7–18)
Bilirubin,Total: 0.6 mg/dL (ref 0.2–1.0)
Calcium, Total: 9.1 mg/dL (ref 8.5–10.1)
Chloride: 97 mmol/L — ABNORMAL LOW (ref 98–107)
Co2: 29 mmol/L (ref 21–32)
Creatinine: 0.89 mg/dL (ref 0.60–1.30)
EGFR (African American): >60
EGFR (Non-African Amer.): >60
Glucose: 99 mg/dL (ref 65–99)
Osmolality: 263 (ref 275–301)
Potassium: 4 mmol/L (ref 3.5–5.1)
SGOT(AST): 20 U/L (ref 15–37)
SGPT (ALT): 32 U/L (ref 12–78)
Sodium: 131 mmol/L — ABNORMAL LOW (ref 136–145)
Total Protein: 6.6 g/dL (ref 6.4–8.2)

## 2013-08-12 LAB — CBC
HCT: 43.4 % (ref 40.0–52.0)
HGB: 14.9 g/dL (ref 13.0–18.0)
MCH: 31.3 pg (ref 26.0–34.0)
MCHC: 34.3 g/dL (ref 32.0–36.0)
MCV: 91 fL (ref 80–100)
Platelet: 161 10*3/uL (ref 150–440)
RBC: 4.76 10*6/uL (ref 4.40–5.90)
RDW: 13.6 % (ref 11.5–14.5)
WBC: 8.2 10*3/uL (ref 3.8–10.6)

## 2013-08-12 LAB — URINALYSIS, COMPLETE
Bacteria: NONE SEEN
Bilirubin,UR: NEGATIVE
Blood: NEGATIVE
Glucose,UR: NEGATIVE mg/dL (ref 0–75)
Ketone: NEGATIVE
Nitrite: NEGATIVE
Ph: 6 (ref 4.5–8.0)
Protein: NEGATIVE
RBC,UR: 1 /HPF (ref 0–5)
Specific Gravity: 1.009 (ref 1.003–1.030)
Squamous Epithelial: <1
WBC UR: 1 /HPF (ref 0–5)

## 2013-08-29 ENCOUNTER — Emergency Department: Payer: Self-pay | Admitting: Emergency Medicine

## 2013-08-29 LAB — TROPONIN I: Troponin-I: <0.02 ng/mL

## 2013-08-29 LAB — CBC
HCT: 43.2 % (ref 40.0–52.0)
HGB: 14.5 g/dL (ref 13.0–18.0)
MCH: 31 pg (ref 26.0–34.0)
MCHC: 33.6 g/dL (ref 32.0–36.0)
MCV: 92 fL (ref 80–100)
Platelet: 158 10*3/uL (ref 150–440)
RBC: 4.69 10*6/uL (ref 4.40–5.90)
RDW: 13.5 % (ref 11.5–14.5)
WBC: 7.7 10*3/uL (ref 3.8–10.6)

## 2013-08-29 LAB — BASIC METABOLIC PANEL
Anion Gap: 5 — ABNORMAL LOW (ref 7–16)
BUN: 14 mg/dL (ref 7–18)
Calcium, Total: 9.5 mg/dL (ref 8.5–10.1)
Chloride: 101 mmol/L (ref 98–107)
Co2: 31 mmol/L (ref 21–32)
Creatinine: 1.02 mg/dL (ref 0.60–1.30)
EGFR (African American): >60
EGFR (Non-African Amer.): >60
Glucose: 91 mg/dL (ref 65–99)
Osmolality: 274 (ref 275–301)
Potassium: 4.1 mmol/L (ref 3.5–5.1)
Sodium: 137 mmol/L (ref 136–145)

## 2013-08-29 LAB — CK TOTAL AND CKMB (NOT AT ARMC)
CK, Total: 225 U/L (ref 35–232)
CK-MB: 3.9 ng/mL — ABNORMAL HIGH (ref 0.5–3.6)

## 2013-08-29 LAB — PROTIME-INR
INR: 1
Prothrombin Time: 13.2 secs (ref 11.5–14.7)

## 2013-08-30 LAB — URINALYSIS, COMPLETE
Bacteria: NONE SEEN
Bilirubin,UR: NEGATIVE
Blood: NEGATIVE
Glucose,UR: NEGATIVE mg/dL (ref 0–75)
Ketone: NEGATIVE
Leukocyte Esterase: NEGATIVE
Nitrite: NEGATIVE
Ph: 7 (ref 4.5–8.0)
Protein: NEGATIVE
RBC,UR: 1 /HPF (ref 0–5)
Specific Gravity: 1.004 (ref 1.003–1.030)
Squamous Epithelial: NONE SEEN
WBC UR: NONE SEEN /HPF (ref 0–5)

## 2013-08-30 LAB — TROPONIN I: Troponin-I: <0.02 ng/mL

## 2013-09-02 IMAGING — CR DG CHEST 2V
1 series · 2 of 2 positions shown · non-contrast
Comparison: none

REASON FOR EXAM: sob
COMMENTS:

[Series 1: w chest pa · 0.14mm/px · 2 of 2 slices shown]
[im 1/2]
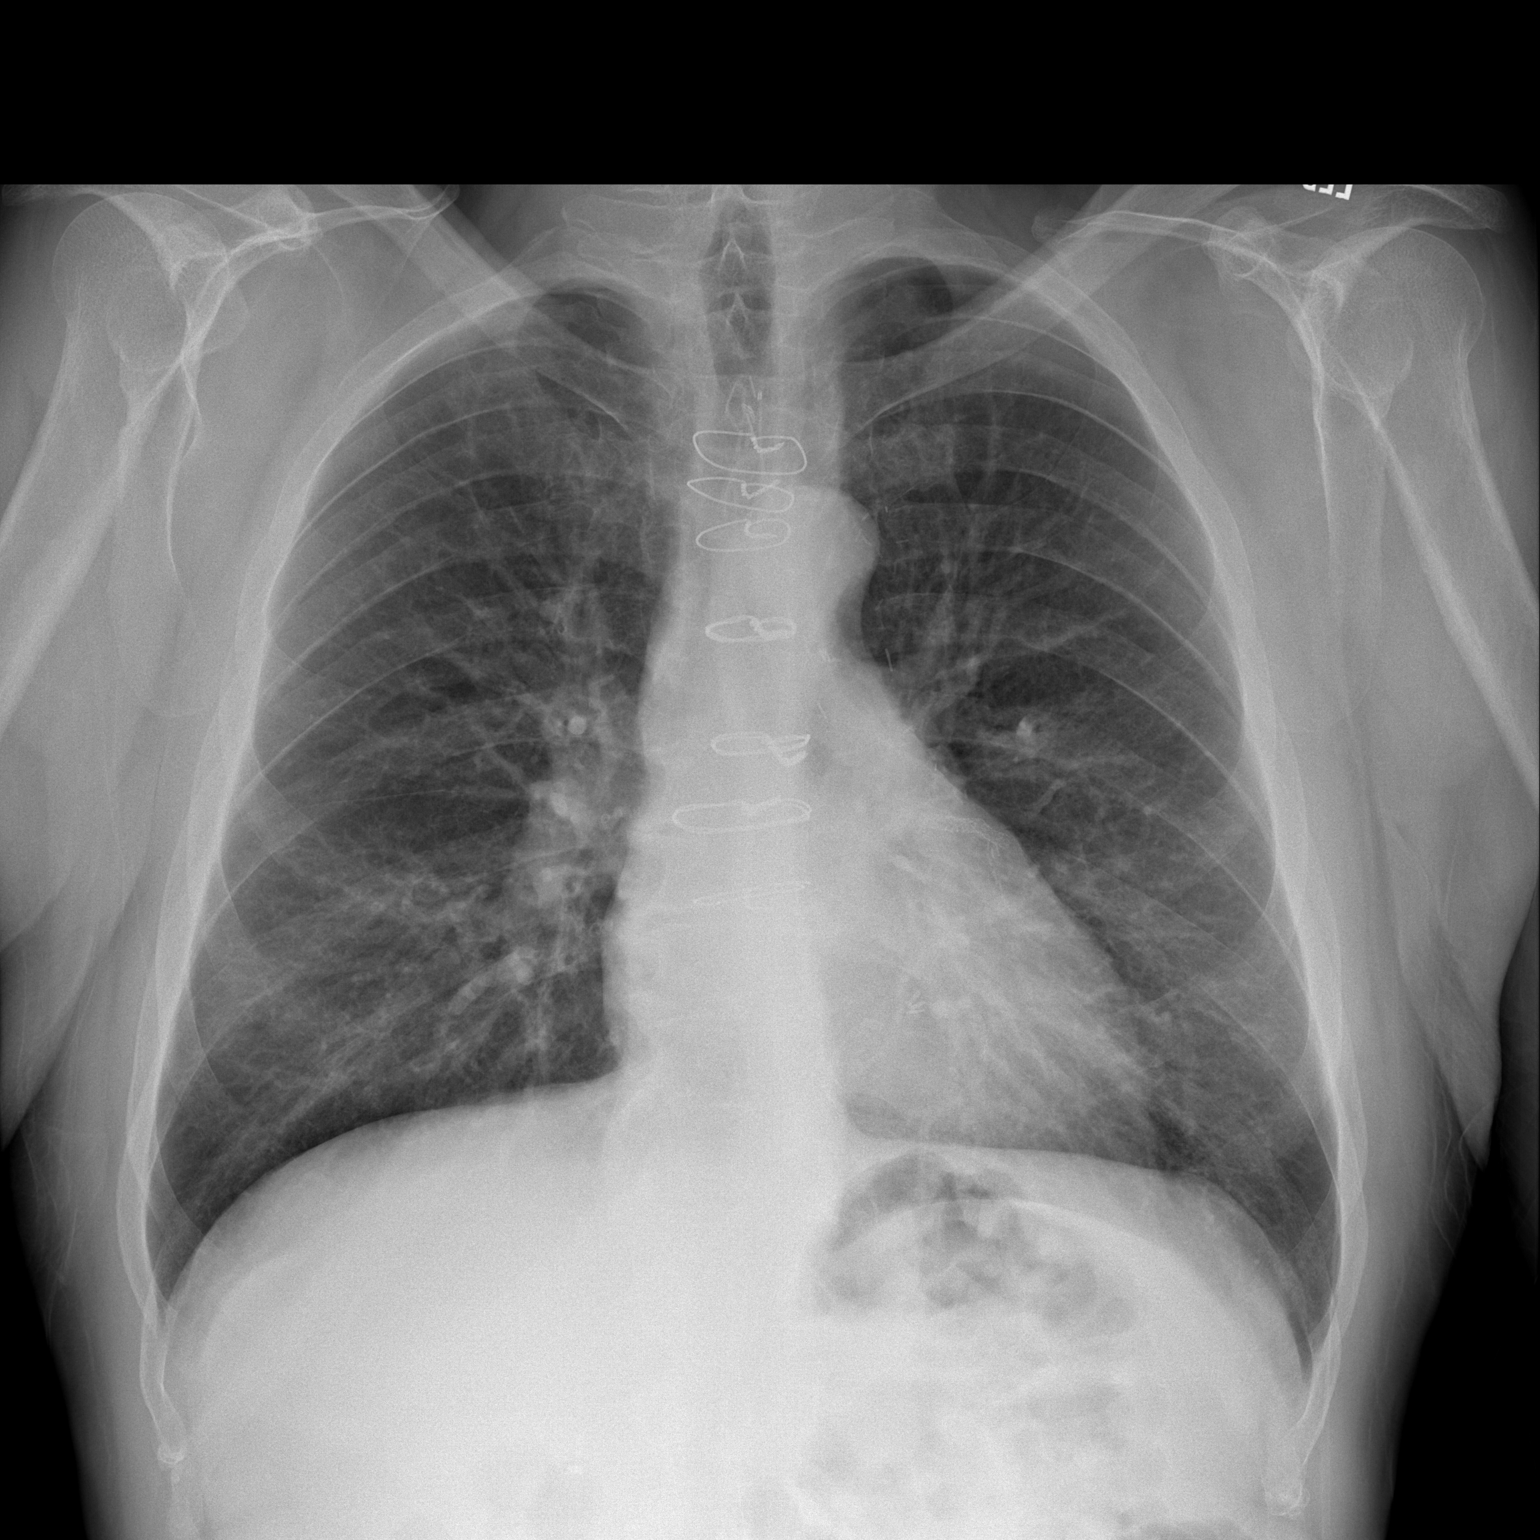
[im 2/2]
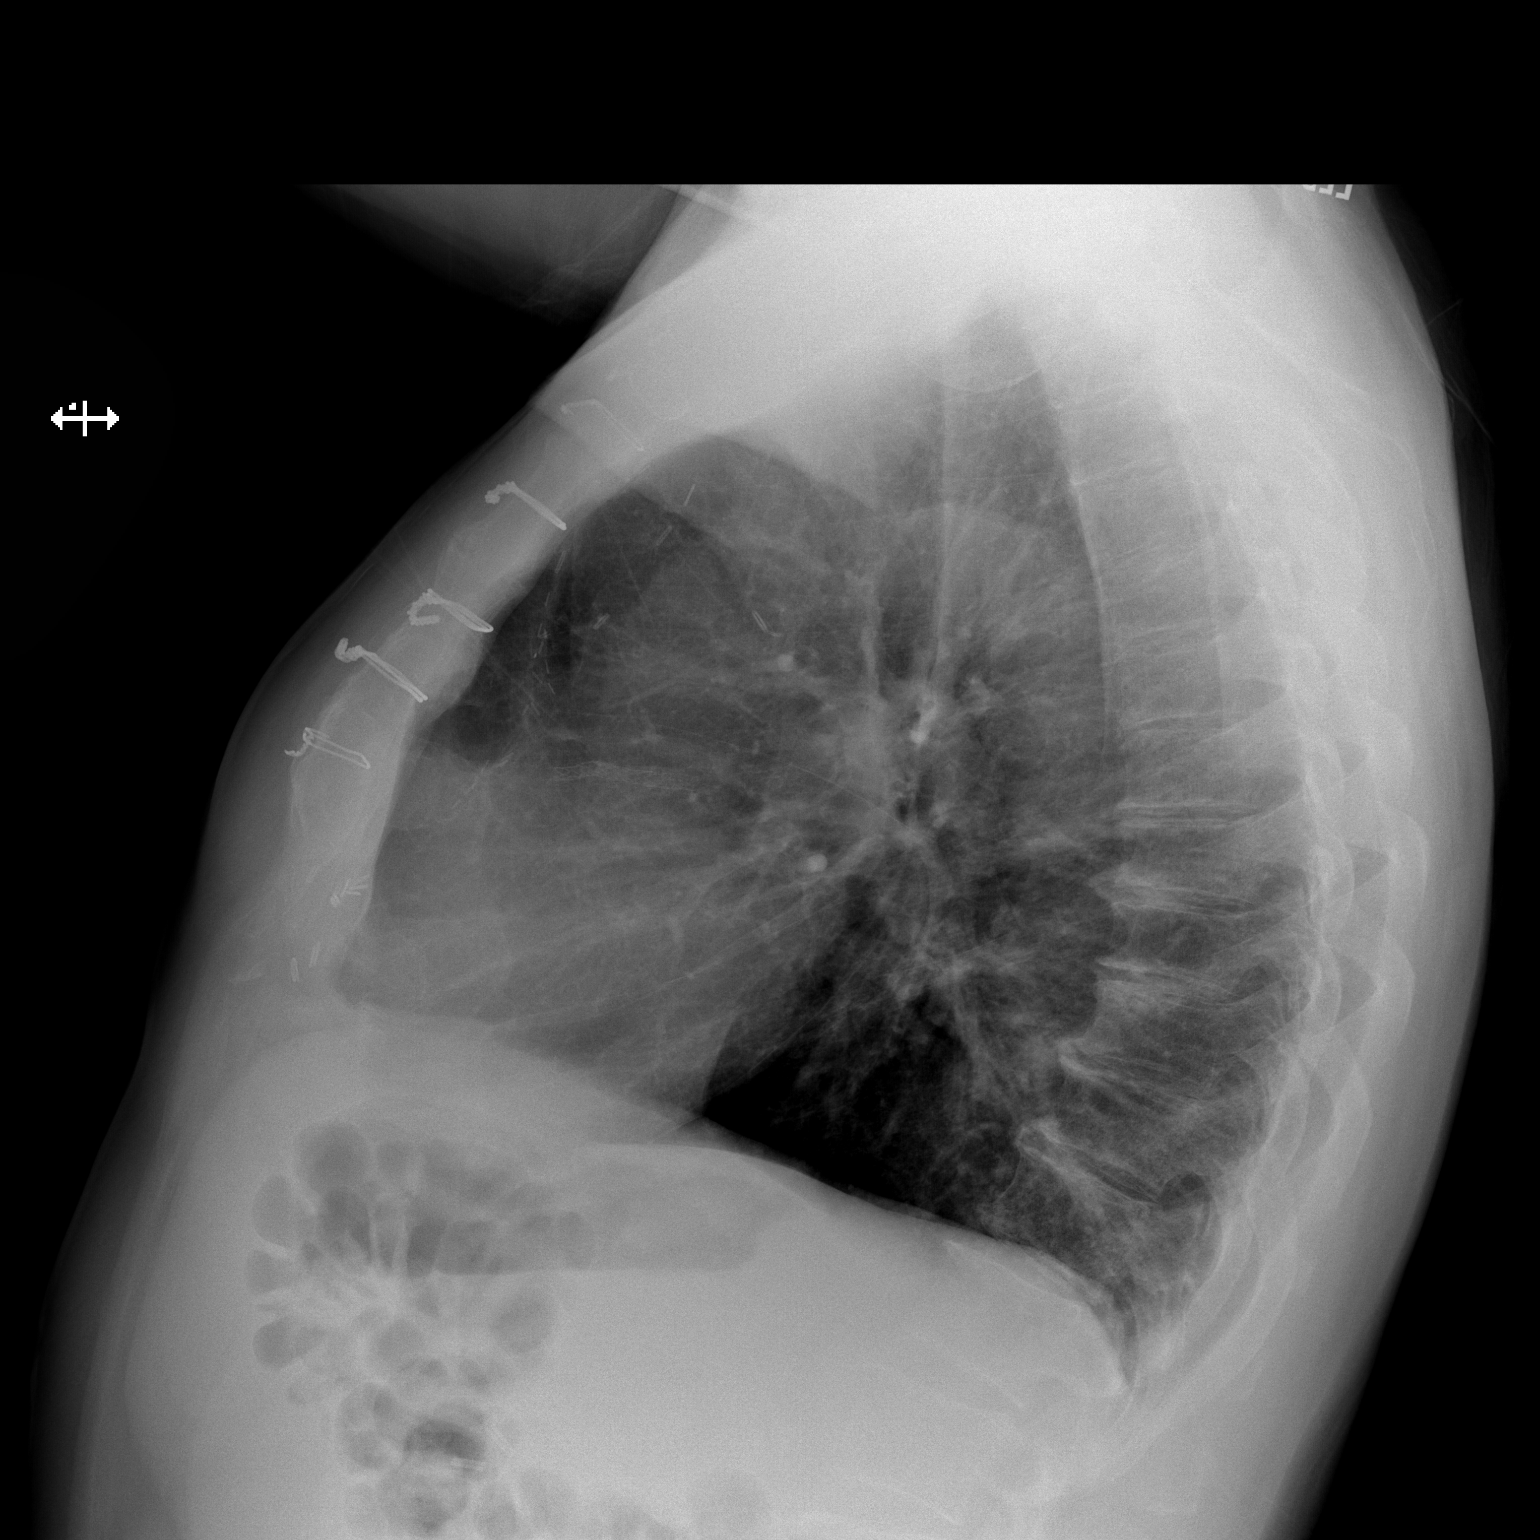

[2 of 2 positions shown; findings below may reference images not displayed]

PROCEDURE:     DXR - DXR CHEST PA (OR AP) AND LATERAL  - October 22, 2011  [DATE]

RESULT:     Comparison is made to study August 03, 2010.

The lungs are mildly hyperinflated with hemidiaphragm flattening. There is
no focal infiltrate. The cardiac silhouette is normal in size. The perihilar
lung markings are increased. The patient has undergone previous median
sternotomy.
IMPRESSION: The findings may reflect low-grade CHF superimposed upon
COPD. Alternatively perihilar subsegmental atelectasis superimposed upon
COPD could be present. Followup films following therapy would be of value.

## 2013-10-11 LAB — COMPREHENSIVE METABOLIC PANEL
Albumin: 3.5 g/dL (ref 3.4–5.0)
Alkaline Phosphatase: 52 U/L
Anion Gap: 4 — ABNORMAL LOW (ref 7–16)
BUN: 19 mg/dL — ABNORMAL HIGH (ref 7–18)
Bilirubin,Total: 0.5 mg/dL (ref 0.2–1.0)
Calcium, Total: 9.2 mg/dL (ref 8.5–10.1)
Chloride: 104 mmol/L (ref 98–107)
Co2: 29 mmol/L (ref 21–32)
Creatinine: 0.91 mg/dL (ref 0.60–1.30)
EGFR (African American): >60
EGFR (Non-African Amer.): >60
Glucose: 81 mg/dL (ref 65–99)
Osmolality: 275 (ref 275–301)
Potassium: 4.1 mmol/L (ref 3.5–5.1)
SGOT(AST): 22 U/L (ref 15–37)
SGPT (ALT): 31 U/L (ref 12–78)
Sodium: 137 mmol/L (ref 136–145)
Total Protein: 6.8 g/dL (ref 6.4–8.2)

## 2013-10-11 LAB — CK TOTAL AND CKMB (NOT AT ARMC)
CK, Total: 92 U/L (ref 35–232)
CK, Total: 78 U/L (ref 35–232)
CK-MB: 2 ng/mL (ref 0.5–3.6)
CK-MB: 1.9 ng/mL (ref 0.5–3.6)

## 2013-10-11 LAB — CBC
HCT: 44.7 % (ref 40.0–52.0)
HGB: 15.1 g/dL (ref 13.0–18.0)
MCH: 30.9 pg (ref 26.0–34.0)
MCHC: 33.7 g/dL (ref 32.0–36.0)
MCV: 92 fL (ref 80–100)
Platelet: 174 10*3/uL (ref 150–440)
RBC: 4.88 10*6/uL (ref 4.40–5.90)
RDW: 13.5 % (ref 11.5–14.5)
WBC: 7.2 10*3/uL (ref 3.8–10.6)

## 2013-10-11 LAB — TROPONIN I
Troponin-I: <0.02 ng/mL
Troponin-I: <0.02 ng/mL

## 2013-10-12 LAB — CK TOTAL AND CKMB (NOT AT ARMC)
CK, Total: 78 U/L (ref 35–232)
CK-MB: 1.7 ng/mL (ref 0.5–3.6)

## 2013-10-12 LAB — TROPONIN I: Troponin-I: <0.02 ng/mL

## 2013-10-13 ENCOUNTER — Inpatient Hospital Stay: Payer: Self-pay | Admitting: Internal Medicine

## 2013-10-13 DIAGNOSIS — I2510 Atherosclerotic heart disease of native coronary artery without angina pectoris: Secondary | ICD-10-CM

## 2013-10-13 LAB — CK TOTAL AND CKMB (NOT AT ARMC)
CK, Total: 43 U/L (ref 35–232)
CK-MB: 1 ng/mL (ref 0.5–3.6)

## 2013-10-14 LAB — BASIC METABOLIC PANEL
Anion Gap: 1 — ABNORMAL LOW (ref 7–16)
BUN: 16 mg/dL (ref 7–18)
Calcium, Total: 8.8 mg/dL (ref 8.5–10.1)
Chloride: 102 mmol/L (ref 98–107)
Co2: 34 mmol/L — ABNORMAL HIGH (ref 21–32)
Creatinine: 0.92 mg/dL (ref 0.60–1.30)
EGFR (African American): >60
EGFR (Non-African Amer.): >60
Glucose: 141 mg/dL — ABNORMAL HIGH (ref 65–99)
Osmolality: 277 (ref 275–301)
Potassium: 4.1 mmol/L (ref 3.5–5.1)
Sodium: 137 mmol/L (ref 136–145)

## 2013-10-16 ENCOUNTER — Encounter: Payer: Self-pay | Admitting: Cardiovascular Disease

## 2013-12-03 ENCOUNTER — Observation Stay: Payer: Self-pay | Admitting: Internal Medicine

## 2013-12-03 LAB — CBC
HCT: 45.6 % (ref 40.0–52.0)
HGB: 14.9 g/dL (ref 13.0–18.0)
MCH: 30.1 pg (ref 26.0–34.0)
MCHC: 32.7 g/dL (ref 32.0–36.0)
MCV: 92 fL (ref 80–100)
Platelet: 187 10*3/uL (ref 150–440)
RBC: 4.95 10*6/uL (ref 4.40–5.90)
RDW: 13.4 % (ref 11.5–14.5)
WBC: 7.4 10*3/uL (ref 3.8–10.6)

## 2013-12-03 LAB — BASIC METABOLIC PANEL
Anion Gap: 5 — ABNORMAL LOW (ref 7–16)
BUN: 14 mg/dL (ref 7–18)
Calcium, Total: 9.1 mg/dL (ref 8.5–10.1)
Chloride: 104 mmol/L (ref 98–107)
Co2: 31 mmol/L (ref 21–32)
Creatinine: 0.85 mg/dL (ref 0.60–1.30)
EGFR (African American): >60
EGFR (Non-African Amer.): >60
Glucose: 117 mg/dL — ABNORMAL HIGH (ref 65–99)
Osmolality: 281 (ref 275–301)
Potassium: 3.7 mmol/L (ref 3.5–5.1)
Sodium: 140 mmol/L (ref 136–145)

## 2013-12-03 LAB — TROPONIN I: Troponin-I: <0.02 ng/mL

## 2013-12-04 LAB — LIPID PANEL
Cholesterol: 104 mg/dL (ref 0–200)
HDL Cholesterol: 41 mg/dL (ref 40–60)
Ldl Cholesterol, Calc: 46 mg/dL (ref 0–100)
Triglycerides: 86 mg/dL (ref 0–200)
VLDL Cholesterol, Calc: 17 mg/dL (ref 5–40)

## 2013-12-04 LAB — TROPONIN I
Troponin-I: <0.02 ng/mL
Troponin-I: <0.02 ng/mL

## 2013-12-04 LAB — CK-MB: CK-MB: 2.1 ng/mL (ref 0.5–3.6)

## 2013-12-05 LAB — PROTIME-INR
INR: 1
Prothrombin Time: 13.5 secs (ref 11.5–14.7)

## 2013-12-05 LAB — CBC WITH DIFFERENTIAL/PLATELET
Basophil #: 0.1 10*3/uL (ref 0.0–0.1)
Basophil %: 1.1 %
Eosinophil #: 0.2 10*3/uL (ref 0.0–0.7)
Eosinophil %: 3.2 %
HCT: 48.5 % (ref 40.0–52.0)
HGB: 15.9 g/dL (ref 13.0–18.0)
Lymphocyte #: 1.7 10*3/uL (ref 1.0–3.6)
Lymphocyte %: 22.3 %
MCH: 30.1 pg (ref 26.0–34.0)
MCHC: 32.7 g/dL (ref 32.0–36.0)
MCV: 92 fL (ref 80–100)
Monocyte #: 0.5 x10 3/mm (ref 0.2–1.0)
Monocyte %: 6.6 %
Neutrophil #: 5.1 10*3/uL (ref 1.4–6.5)
Neutrophil %: 66.8 %
Platelet: 194 10*3/uL (ref 150–440)
RBC: 5.27 10*6/uL (ref 4.40–5.90)
RDW: 13.7 % (ref 11.5–14.5)
WBC: 7.6 10*3/uL (ref 3.8–10.6)

## 2013-12-05 LAB — BASIC METABOLIC PANEL
Anion Gap: 4 — ABNORMAL LOW (ref 7–16)
BUN: 17 mg/dL (ref 7–18)
Calcium, Total: 9.2 mg/dL (ref 8.5–10.1)
Chloride: 103 mmol/L (ref 98–107)
Co2: 33 mmol/L — ABNORMAL HIGH (ref 21–32)
Creatinine: 0.92 mg/dL (ref 0.60–1.30)
EGFR (African American): >60
EGFR (Non-African Amer.): >60
Glucose: 105 mg/dL — ABNORMAL HIGH (ref 65–99)
Osmolality: 281 (ref 275–301)
Potassium: 3.9 mmol/L (ref 3.5–5.1)
Sodium: 140 mmol/L (ref 136–145)

## 2013-12-26 ENCOUNTER — Emergency Department: Payer: Self-pay | Admitting: Emergency Medicine

## 2014-01-19 ENCOUNTER — Emergency Department: Payer: Self-pay | Admitting: Emergency Medicine

## 2014-01-19 LAB — URINALYSIS, COMPLETE
Bacteria: NONE SEEN
Bilirubin,UR: NEGATIVE
Glucose,UR: NEGATIVE mg/dL (ref 0–75)
Hyaline Cast: 4
Ketone: NEGATIVE
Leukocyte Esterase: NEGATIVE
Nitrite: NEGATIVE
Ph: 5 (ref 4.5–8.0)
Protein: NEGATIVE
RBC,UR: 15 /HPF (ref 0–5)
Specific Gravity: 1.028 (ref 1.003–1.030)
Squamous Epithelial: NONE SEEN
WBC UR: 1 /HPF (ref 0–5)

## 2014-01-19 LAB — COMPREHENSIVE METABOLIC PANEL
Albumin: 3.7 g/dL (ref 3.4–5.0)
Alkaline Phosphatase: 68 U/L
Anion Gap: 7 (ref 7–16)
BUN: 28 mg/dL — ABNORMAL HIGH (ref 7–18)
Bilirubin,Total: 1.1 mg/dL — ABNORMAL HIGH (ref 0.2–1.0)
Calcium, Total: 9.2 mg/dL (ref 8.5–10.1)
Chloride: 103 mmol/L (ref 98–107)
Co2: 27 mmol/L (ref 21–32)
Creatinine: 1.27 mg/dL (ref 0.60–1.30)
EGFR (African American): >60
EGFR (Non-African Amer.): >60
Glucose: 104 mg/dL — ABNORMAL HIGH (ref 65–99)
Osmolality: 280 (ref 275–301)
Potassium: 4.1 mmol/L (ref 3.5–5.1)
SGOT(AST): 22 U/L (ref 15–37)
SGPT (ALT): 25 U/L (ref 12–78)
Sodium: 137 mmol/L (ref 136–145)
Total Protein: 7.9 g/dL (ref 6.4–8.2)

## 2014-01-19 LAB — CBC
HCT: 51.2 % (ref 40.0–52.0)
HGB: 17.1 g/dL (ref 13.0–18.0)
MCH: 29.9 pg (ref 26.0–34.0)
MCHC: 33.4 g/dL (ref 32.0–36.0)
MCV: 90 fL (ref 80–100)
Platelet: 165 10*3/uL (ref 150–440)
RBC: 5.72 10*6/uL (ref 4.40–5.90)
RDW: 13.6 % (ref 11.5–14.5)
WBC: 6.3 10*3/uL (ref 3.8–10.6)

## 2014-01-19 LAB — CK TOTAL AND CKMB (NOT AT ARMC)
CK, Total: 98 U/L
CK-MB: 1.4 ng/mL (ref 0.5–3.6)

## 2014-01-19 LAB — LIPASE, BLOOD: Lipase: 76 U/L (ref 73–393)

## 2014-01-21 LAB — URINE CULTURE

## 2014-05-22 ENCOUNTER — Inpatient Hospital Stay: Payer: Self-pay | Admitting: Internal Medicine

## 2014-05-22 LAB — CBC
HCT: 44.8 % (ref 40.0–52.0)
HGB: 14.5 g/dL (ref 13.0–18.0)
MCH: 29 pg (ref 26.0–34.0)
MCHC: 32.3 g/dL (ref 32.0–36.0)
MCV: 90 fL (ref 80–100)
Platelet: 156 10*3/uL (ref 150–440)
RBC: 4.99 10*6/uL (ref 4.40–5.90)
RDW: 14.7 % — ABNORMAL HIGH (ref 11.5–14.5)
WBC: 5.3 10*3/uL (ref 3.8–10.6)

## 2014-05-22 LAB — COMPREHENSIVE METABOLIC PANEL
Albumin: 3.5 g/dL (ref 3.4–5.0)
Alkaline Phosphatase: 49 U/L
Anion Gap: 5 — ABNORMAL LOW (ref 7–16)
BUN: 15 mg/dL (ref 7–18)
Bilirubin,Total: 0.6 mg/dL (ref 0.2–1.0)
Calcium, Total: 9.1 mg/dL (ref 8.5–10.1)
Chloride: 105 mmol/L (ref 98–107)
Co2: 29 mmol/L (ref 21–32)
Creatinine: 0.94 mg/dL (ref 0.60–1.30)
EGFR (African American): >60
EGFR (Non-African Amer.): >60
Glucose: 93 mg/dL (ref 65–99)
Osmolality: 278 (ref 275–301)
Potassium: 3.7 mmol/L (ref 3.5–5.1)
SGOT(AST): 23 U/L (ref 15–37)
SGPT (ALT): 33 U/L (ref 12–78)
Sodium: 139 mmol/L (ref 136–145)
Total Protein: 6.7 g/dL (ref 6.4–8.2)

## 2014-05-22 LAB — MAGNESIUM: Magnesium: 1.7 mg/dL — ABNORMAL LOW

## 2014-05-22 LAB — TROPONIN I: Troponin-I: <0.02 ng/mL

## 2014-05-22 LAB — CK TOTAL AND CKMB (NOT AT ARMC)
CK, Total: 141 U/L
CK-MB: 2.8 ng/mL (ref 0.5–3.6)

## 2014-05-22 LAB — APTT: Activated PTT: 33.5 secs (ref 23.6–35.9)

## 2014-05-22 LAB — PROTIME-INR
INR: 1.1
Prothrombin Time: 13.6 secs (ref 11.5–14.7)

## 2014-05-23 LAB — LIPID PANEL
Cholesterol: 92 mg/dL (ref 0–200)
HDL Cholesterol: 28 mg/dL — ABNORMAL LOW (ref 40–60)
Ldl Cholesterol, Calc: 42 mg/dL (ref 0–100)
Triglycerides: 109 mg/dL (ref 0–200)
VLDL Cholesterol, Calc: 22 mg/dL (ref 5–40)

## 2014-05-23 LAB — TROPONIN I
Troponin-I: <0.02 ng/mL
Troponin-I: <0.02 ng/mL

## 2014-05-23 LAB — CK TOTAL AND CKMB (NOT AT ARMC)
CK, Total: 108 U/L
CK, Total: 93 U/L
CK-MB: 1.7 ng/mL (ref 0.5–3.6)
CK-MB: 2.3 ng/mL (ref 0.5–3.6)

## 2014-05-23 LAB — HEPARIN LEVEL (UNFRACTIONATED): Anti-Xa(Unfractionated): 0.41 IU/mL (ref 0.30–0.70)

## 2014-05-24 LAB — BASIC METABOLIC PANEL
Anion Gap: 7 (ref 7–16)
BUN: 11 mg/dL (ref 7–18)
Calcium, Total: 8.6 mg/dL (ref 8.5–10.1)
Chloride: 106 mmol/L (ref 98–107)
Co2: 29 mmol/L (ref 21–32)
Creatinine: 0.78 mg/dL (ref 0.60–1.30)
EGFR (African American): >60
EGFR (Non-African Amer.): >60
Glucose: 106 mg/dL — ABNORMAL HIGH (ref 65–99)
Osmolality: 283 (ref 275–301)
Potassium: 4 mmol/L (ref 3.5–5.1)
Sodium: 142 mmol/L (ref 136–145)

## 2014-05-24 LAB — CBC WITH DIFFERENTIAL/PLATELET
Basophil #: 0 10*3/uL (ref 0.0–0.1)
Basophil %: 0.5 %
Eosinophil #: 0.1 10*3/uL (ref 0.0–0.7)
Eosinophil %: 2.1 %
HCT: 41.9 % (ref 40.0–52.0)
HGB: 13.8 g/dL (ref 13.0–18.0)
Lymphocyte #: 1.3 10*3/uL (ref 1.0–3.6)
Lymphocyte %: 19.1 %
MCH: 30.4 pg (ref 26.0–34.0)
MCHC: 32.9 g/dL (ref 32.0–36.0)
MCV: 92 fL (ref 80–100)
Monocyte #: 0.5 x10 3/mm (ref 0.2–1.0)
Monocyte %: 7.1 %
Neutrophil #: 5 10*3/uL (ref 1.4–6.5)
Neutrophil %: 71.2 %
Platelet: 145 10*3/uL — ABNORMAL LOW (ref 150–440)
RBC: 4.54 10*6/uL (ref 4.40–5.90)
RDW: 14.5 % (ref 11.5–14.5)
WBC: 7 10*3/uL (ref 3.8–10.6)

## 2014-05-24 LAB — MAGNESIUM: Magnesium: 1.9 mg/dL

## 2014-05-24 LAB — HEPARIN LEVEL (UNFRACTIONATED): Anti-Xa(Unfractionated): 0.85 IU/mL — ABNORMAL HIGH (ref 0.30–0.70)

## 2014-06-07 DIAGNOSIS — K219 Gastro-esophageal reflux disease without esophagitis: Secondary | ICD-10-CM | POA: Insufficient documentation

## 2014-07-04 ENCOUNTER — Ambulatory Visit: Payer: Self-pay | Admitting: Gastroenterology

## 2014-07-18 ENCOUNTER — Ambulatory Visit: Payer: Self-pay | Admitting: Gastroenterology

## 2014-07-24 DIAGNOSIS — K220 Achalasia of cardia: Secondary | ICD-10-CM | POA: Insufficient documentation

## 2014-09-22 ENCOUNTER — Emergency Department: Payer: Self-pay | Admitting: Emergency Medicine

## 2014-09-22 LAB — CBC WITH DIFFERENTIAL/PLATELET
Basophil #: 0.1 10*3/uL (ref 0.0–0.1)
Basophil %: 0.9 %
Eosinophil #: 0.3 10*3/uL (ref 0.0–0.7)
Eosinophil %: 3.7 %
HCT: 47.8 % (ref 40.0–52.0)
HGB: 15.8 g/dL (ref 13.0–18.0)
Lymphocyte #: 2 10*3/uL (ref 1.0–3.6)
Lymphocyte %: 24.9 %
MCH: 30.1 pg (ref 26.0–34.0)
MCHC: 33.2 g/dL (ref 32.0–36.0)
MCV: 91 fL (ref 80–100)
Monocyte #: 0.5 x10 3/mm (ref 0.2–1.0)
Monocyte %: 6.6 %
Neutrophil #: 5.1 10*3/uL (ref 1.4–6.5)
Neutrophil %: 63.9 %
Platelet: 204 10*3/uL (ref 150–440)
RBC: 5.27 10*6/uL (ref 4.40–5.90)
RDW: 13.7 % (ref 11.5–14.5)
WBC: 8 10*3/uL (ref 3.8–10.6)

## 2014-09-22 LAB — URINALYSIS, COMPLETE
Bacteria: NONE SEEN
Bilirubin,UR: NEGATIVE
Blood: NEGATIVE
Glucose,UR: NEGATIVE mg/dL (ref 0–75)
Ketone: NEGATIVE
Leukocyte Esterase: NEGATIVE
Nitrite: NEGATIVE
Ph: 7 (ref 4.5–8.0)
Protein: NEGATIVE
RBC,UR: NONE SEEN /HPF (ref 0–5)
Specific Gravity: 1.005 (ref 1.003–1.030)
Squamous Epithelial: NONE SEEN
WBC UR: NONE SEEN /HPF (ref 0–5)

## 2014-09-22 LAB — HEPATIC FUNCTION PANEL A (ARMC)
Albumin: 3.4 g/dL (ref 3.4–5.0)
Alkaline Phosphatase: 64 U/L
Bilirubin, Direct: <0.1 mg/dL (ref 0.0–0.2)
Bilirubin,Total: 0.6 mg/dL (ref 0.2–1.0)
SGOT(AST): 21 U/L (ref 15–37)
SGPT (ALT): 29 U/L
Total Protein: 6.9 g/dL (ref 6.4–8.2)

## 2014-09-22 LAB — TROPONIN I: Troponin-I: <0.02 ng/mL

## 2014-09-22 LAB — BASIC METABOLIC PANEL
Anion Gap: 5 — ABNORMAL LOW (ref 7–16)
BUN: 12 mg/dL (ref 7–18)
Calcium, Total: 8.8 mg/dL (ref 8.5–10.1)
Chloride: 105 mmol/L (ref 98–107)
Co2: 32 mmol/L (ref 21–32)
Creatinine: 0.77 mg/dL (ref 0.60–1.30)
EGFR (African American): >60
EGFR (Non-African Amer.): >60
Glucose: 84 mg/dL (ref 65–99)
Osmolality: 282 (ref 275–301)
Potassium: 3.7 mmol/L (ref 3.5–5.1)
Sodium: 142 mmol/L (ref 136–145)

## 2014-09-22 LAB — LIPASE, BLOOD: Lipase: 197 U/L (ref 73–393)

## 2014-10-12 IMAGING — CR DG CHEST 1V PORT
1 series · 1 of 1 positions shown · non-contrast
Comparison: none

REASON FOR EXAM: CP
COMMENTS:

[ap]
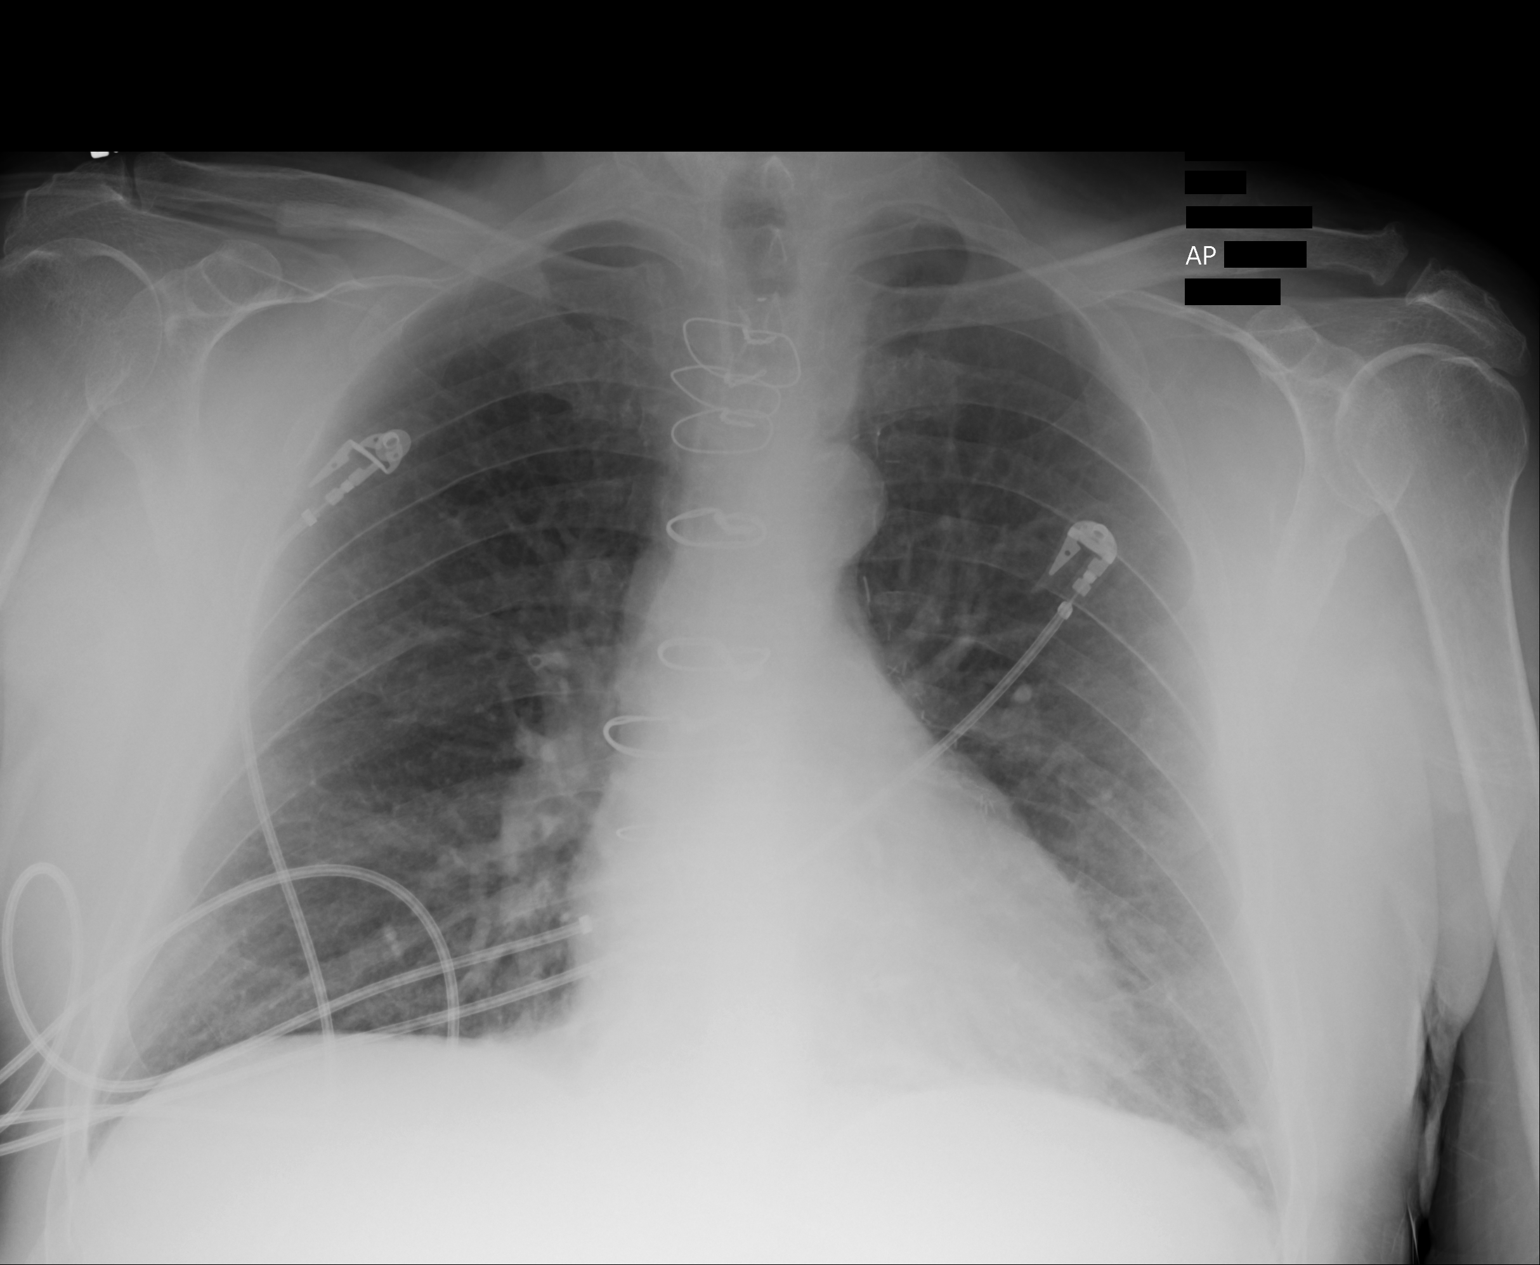

[1 of 1 positions shown; findings below may reference images not displayed]

PROCEDURE:     DXR - DXR PORTABLE CHEST SINGLE VIEW  - November 30, 2012 [DATE]

RESULT:     Comparison is made to a PA study October 22, 2011.

Lungs are mildly hyperinflated. The interstitial markings are increased
especially in the perihilar regions. The cardiac silhouette is top normal in
size. The patient has undergone previous median sternotomy. There is no
pleural effusion. The upper lobe pulmonary vascularity is mildly prominent
but this is not entirely new.
IMPRESSION: The interstitial markings appear mildly increased and the
pulmonary vascularity is increased in prominence centrally since the
previous PA study. The findings suggest low-grade CHF. I cannot exclude
acute bronchitis superimposed upon COPD in the appropriate clinical setting.
A followup PA and lateral chest x-ray would be of value.

[REDACTED]

## 2014-11-18 ENCOUNTER — Emergency Department: Payer: Self-pay | Admitting: Emergency Medicine

## 2014-11-18 LAB — CBC WITH DIFFERENTIAL/PLATELET
Basophil #: 0.1 10*3/uL (ref 0.0–0.1)
Basophil %: 0.8 %
Eosinophil #: 0.4 10*3/uL (ref 0.0–0.7)
Eosinophil %: 5.9 %
HCT: 49.3 % (ref 40.0–52.0)
HGB: 16.1 g/dL (ref 13.0–18.0)
Lymphocyte #: 2.2 10*3/uL (ref 1.0–3.6)
Lymphocyte %: 29 %
MCH: 30 pg (ref 26.0–34.0)
MCHC: 32.7 g/dL (ref 32.0–36.0)
MCV: 92 fL (ref 80–100)
Monocyte #: 0.7 x10 3/mm (ref 0.2–1.0)
Monocyte %: 9.3 %
Neutrophil #: 4.1 10*3/uL (ref 1.4–6.5)
Neutrophil %: 55 %
Platelet: 166 10*3/uL (ref 150–440)
RBC: 5.38 10*6/uL (ref 4.40–5.90)
RDW: 13.7 % (ref 11.5–14.5)
WBC: 7.4 10*3/uL (ref 3.8–10.6)

## 2014-11-18 LAB — COMPREHENSIVE METABOLIC PANEL
Albumin: 3.6 g/dL (ref 3.4–5.0)
Alkaline Phosphatase: 67 U/L
Anion Gap: 6 — ABNORMAL LOW (ref 7–16)
BUN: 11 mg/dL (ref 7–18)
Bilirubin,Total: 0.5 mg/dL (ref 0.2–1.0)
Calcium, Total: 9 mg/dL (ref 8.5–10.1)
Chloride: 105 mmol/L (ref 98–107)
Co2: 31 mmol/L (ref 21–32)
Creatinine: 0.89 mg/dL (ref 0.60–1.30)
EGFR (African American): >60
EGFR (Non-African Amer.): >60
Glucose: 99 mg/dL (ref 65–99)
Osmolality: 283 (ref 275–301)
Potassium: 4 mmol/L (ref 3.5–5.1)
SGOT(AST): 27 U/L (ref 15–37)
SGPT (ALT): 35 U/L
Sodium: 142 mmol/L (ref 136–145)
Total Protein: 6.8 g/dL (ref 6.4–8.2)

## 2014-11-18 LAB — LIPASE, BLOOD: Lipase: 141 U/L (ref 73–393)

## 2014-11-18 LAB — TROPONIN I: Troponin-I: <0.02 ng/mL

## 2014-12-14 ENCOUNTER — Emergency Department: Payer: Self-pay | Admitting: Emergency Medicine

## 2015-01-03 ENCOUNTER — Emergency Department: Payer: Self-pay | Admitting: Emergency Medicine

## 2015-01-28 ENCOUNTER — Emergency Department: Payer: Self-pay | Admitting: Emergency Medicine

## 2015-01-30 ENCOUNTER — Emergency Department: Payer: Self-pay | Admitting: Emergency Medicine

## 2015-02-01 ENCOUNTER — Emergency Department: Payer: Self-pay | Admitting: Emergency Medicine

## 2015-02-03 ENCOUNTER — Emergency Department: Payer: Self-pay | Admitting: Emergency Medicine

## 2015-02-26 ENCOUNTER — Emergency Department
Admit: 2015-02-26 | Disposition: A | Discharge status: Home / Self Care | Payer: Self-pay | Admitting: Emergency Medicine

## 2015-02-26 LAB — BASIC METABOLIC PANEL
Anion Gap: 6 — ABNORMAL LOW (ref 7–16)
BUN: 12 mg/dL
Calcium, Total: 9.3 mg/dL
Chloride: 102 mmol/L
Co2: 30 mmol/L
Creatinine: 0.94 mg/dL
EGFR (African American): >60
EGFR (Non-African Amer.): >60
Glucose: 113 mg/dL — ABNORMAL HIGH
Potassium: 3.5 mmol/L
Sodium: 138 mmol/L

## 2015-02-26 LAB — CBC
HCT: 48.5 % (ref 40.0–52.0)
HGB: 15.8 g/dL (ref 13.0–18.0)
MCH: 29.1 pg (ref 26.0–34.0)
MCHC: 32.5 g/dL (ref 32.0–36.0)
MCV: 90 fL (ref 80–100)
Platelet: 189 10*3/uL (ref 150–440)
RBC: 5.42 10*6/uL (ref 4.40–5.90)
RDW: 14.2 % (ref 11.5–14.5)
WBC: 7.7 10*3/uL (ref 3.8–10.6)

## 2015-02-26 LAB — PRO B NATRIURETIC PEPTIDE: B-Type Natriuretic Peptide: 18 pg/mL

## 2015-02-26 LAB — TROPONIN I: Troponin-I: <0.03 ng/mL

## 2015-03-01 ENCOUNTER — Emergency Department
Admit: 2015-03-01 | Disposition: A | Discharge status: Home / Self Care | Payer: Self-pay | Admitting: Emergency Medicine

## 2015-03-01 LAB — COMPREHENSIVE METABOLIC PANEL
Albumin: 4 g/dL
Alkaline Phosphatase: 69 U/L
Anion Gap: 4 — ABNORMAL LOW (ref 7–16)
BUN: 10 mg/dL
Bilirubin,Total: 0.6 mg/dL
Calcium, Total: 9.5 mg/dL
Chloride: 103 mmol/L
Co2: 33 mmol/L — ABNORMAL HIGH
Creatinine: 0.93 mg/dL
EGFR (African American): >60
EGFR (Non-African Amer.): >60
Glucose: 87 mg/dL
Potassium: 4.3 mmol/L
SGOT(AST): 22 U/L
SGPT (ALT): 24 U/L
Sodium: 140 mmol/L
Total Protein: 7.1 g/dL

## 2015-03-01 LAB — CBC WITH DIFFERENTIAL/PLATELET
Basophil #: 0 10*3/uL (ref 0.0–0.1)
Basophil %: 0.6 %
Eosinophil #: 0.4 10*3/uL (ref 0.0–0.7)
Eosinophil %: 4.7 %
HCT: 50.7 % (ref 40.0–52.0)
HGB: 16.4 g/dL (ref 13.0–18.0)
Lymphocyte #: 2 10*3/uL (ref 1.0–3.6)
Lymphocyte %: 26.4 %
MCH: 29 pg (ref 26.0–34.0)
MCHC: 32.5 g/dL (ref 32.0–36.0)
MCV: 90 fL (ref 80–100)
Monocyte #: 0.6 x10 3/mm (ref 0.2–1.0)
Monocyte %: 7.5 %
Neutrophil #: 4.5 10*3/uL (ref 1.4–6.5)
Neutrophil %: 60.8 %
Platelet: 200 10*3/uL (ref 150–440)
RBC: 5.66 10*6/uL (ref 4.40–5.90)
RDW: 14.2 % (ref 11.5–14.5)
WBC: 7.5 10*3/uL (ref 3.8–10.6)

## 2015-03-01 LAB — URINALYSIS, COMPLETE
Bacteria: NONE SEEN
Bilirubin,UR: NEGATIVE
Blood: NEGATIVE
Glucose,UR: NEGATIVE mg/dL (ref 0–75)
Ketone: NEGATIVE
Leukocyte Esterase: NEGATIVE
Nitrite: NEGATIVE
Ph: 7 (ref 4.5–8.0)
Protein: NEGATIVE
RBC,UR: NONE SEEN /HPF (ref 0–5)
Specific Gravity: 1.005 (ref 1.003–1.030)
Squamous Epithelial: NONE SEEN

## 2015-03-01 LAB — TROPONIN I: Troponin-I: <0.03 ng/mL

## 2015-03-01 LAB — LIPASE, BLOOD: Lipase: 30 U/L

## 2015-03-07 ENCOUNTER — Emergency Department
Admit: 2015-03-07 | Disposition: A | Discharge status: Home / Self Care | Payer: Self-pay | Admitting: Emergency Medicine

## 2015-03-07 LAB — COMPREHENSIVE METABOLIC PANEL
Albumin: 3.8 g/dL
Alkaline Phosphatase: 61 U/L
Anion Gap: 7 (ref 7–16)
BUN: 9 mg/dL
Bilirubin,Total: 0.6 mg/dL
Calcium, Total: 9.6 mg/dL
Chloride: 107 mmol/L
Co2: 29 mmol/L
Creatinine: 0.84 mg/dL
EGFR (African American): >60
EGFR (Non-African Amer.): >60
Glucose: 99 mg/dL
Potassium: 3.9 mmol/L
SGOT(AST): 25 U/L
SGPT (ALT): 25 U/L
Sodium: 143 mmol/L
Total Protein: 6.8 g/dL

## 2015-03-07 LAB — CBC WITH DIFFERENTIAL/PLATELET
Basophil #: 0.1 10*3/uL (ref 0.0–0.1)
Basophil %: 0.7 %
Eosinophil #: 0.4 10*3/uL (ref 0.0–0.7)
Eosinophil %: 4.8 %
HCT: 50 % (ref 40.0–52.0)
HGB: 16.4 g/dL (ref 13.0–18.0)
Lymphocyte #: 1.9 10*3/uL (ref 1.0–3.6)
Lymphocyte %: 24.2 %
MCH: 29.1 pg (ref 26.0–34.0)
MCHC: 32.7 g/dL (ref 32.0–36.0)
MCV: 89 fL (ref 80–100)
Monocyte #: 0.6 x10 3/mm (ref 0.2–1.0)
Monocyte %: 7.1 %
Neutrophil #: 5 10*3/uL (ref 1.4–6.5)
Neutrophil %: 63.2 %
Platelet: 183 10*3/uL (ref 150–440)
RBC: 5.61 10*6/uL (ref 4.40–5.90)
RDW: 14.1 % (ref 11.5–14.5)
WBC: 7.9 10*3/uL (ref 3.8–10.6)

## 2015-03-07 LAB — URINALYSIS, COMPLETE
Bilirubin,UR: NEGATIVE
Blood: NEGATIVE
Glucose,UR: NEGATIVE mg/dL (ref 0–75)
Ketone: NEGATIVE
Leukocyte Esterase: NEGATIVE
Nitrite: NEGATIVE
Ph: 7 (ref 4.5–8.0)
Protein: NEGATIVE
Specific Gravity: 1.012 (ref 1.003–1.030)

## 2015-03-07 LAB — LIPASE, BLOOD: Lipase: 26 U/L

## 2015-03-07 LAB — TROPONIN I: Troponin-I: <0.03 ng/mL

## 2015-03-08 NOTE — H&P (Signed)
PATIENT NAME:  Lawrence [name], Lawrence [name] MR#:  [id number] DATE OF BIRTH:  August 27, 1956  DATE OF ADMISSION:  11/30/2012  PRIMARY CARE PHYSICIAN:  Dr. [name].   REFERRING PHYSICIAN:  Dr. Lurline Hare.   CHIEF COMPLAINT:  Chest pain.   HISTORY OF PRESENT ILLNESS:  The patient is a 59 year old Caucasian male with history of coronary artery disease, status post coronary artery bypass graft in 2002 and recently at [facility] he had stent implant in November 2013.  The patient reports that over the last few months he has had recurrent mild chest pain, however last evening around 3:00 p.m. he developed a severe midsternal chest pain described as a pressure-like feeling, 8 on a scale of 10 associated with shortness of breath when he is walking only.  He had nausea, but no vomiting.  When pain persisted his son pressured him to come to the hospital.  Here he received nitroglycerin and morphine and finally chest pain improved and he is now chest pain-free.  His admission EKG showed sinus rhythm without any ischemic changes.  His first troponin was normal.  However, given his extensive past medical history of coronary artery disease and multiple interventions while continuing his lifestyle of smoking makes him high risk and his presentation is more compatible with unstable angina.   REVIEW OF SYSTEMS:   CONSTITUTIONAL:  Denies any fever.  No chills.  No fatigue.  EYES:  No blurring of vision.  No double vision.  EARS, NOSE, THROAT:  No hearing impairment.  No sore throat.  No dysphagia.  CARDIOVASCULAR:  Reports chest pain and exertional shortness of breath as above.  No edema.  No syncope.  RESPIRATORY:  No cough or sputum production, only mild cough, usual for his smoking.  No hemoptysis.  GASTROINTESTINAL:  No abdominal pain, no vomiting, no diarrhea, but he has mild nausea.  GENITOURINARY:  No dysuria.  No frequency of urination.  MUSCULOSKELETAL:  No joint pain or swelling.  No muscular pain or swelling.   INTEGUMENTARY:  No skin rash.  No ulcers.  NEUROLOGY:  No focal weakness.  No seizure activity.  No headache.  PSYCHIATRY:  No anxiety.  No depression.  ENDOCRINE:  No polyuria or polydipsia.  No heat or cold intolerance.  HEMATOLOGY:  No easy bruisability.  No lymph node enlargement.   SOCIAL HABITS:  Chronic smoker since the age of 43.  He smokes 1 pack a day.  No history of alcoholism for the last 30 years, prior to that he was alcoholic.   SOCIAL HISTORY:  He is married, living with his wife.  He lives on disability.   FAMILY HISTORY:  His father died from cirrhosis of the liver and he was diabetic as well.  His mother died from complications of chronic obstructive pulmonary disease.  He has a brother who died from complications of Parkinson's disease.   PAST MEDICAL HISTORY:  Last admission appears to be at [facility] in November 2013 with angina.  He had cardiac cath and ended by having a stent implant.  He was also told he might need another stent in the bypass area.  I am awaiting the records from [facility] and these were requested an hour ago.  His history also includes history of myocardial infarction in 2002 prior to his coronary artery bypass graft, systemic hypertension, chronic obstructive pulmonary disease, ongoing tobacco abuse, diabetes mellitus type 2, hyperlipidemia, depression and anxiety, eczema, hiatal hernia, degenerative joint disease.   PAST SURGICAL HISTORY:  Coronary artery bypass  graft in 2002, cholecystectomy, tonsillectomy and pilonidal cyst removal.   ADMISSION MEDICATIONS:  Atorvastatin 80 mg at night, hydrochlorothiazide with lisinopril 12.5/20 once a day, furosemide once a day, potassium chloride once a day, Spiriva 1 inhalation once a day, Xopenex HFA as needed, Plavix 75 mg once a day, aspirin 81 mg a day, Percocet 10/325 q. 6 hours as needed, Nexium 40 mg a day, metformin 1000 mg once a day, Lovaza 1000 mg 4 capsules once a day, Lasix 40 mg a day, potassium 10 mEq a day,  isosorbide mononitrate 30 mg once a day, Coreg 12.5 mg once a day, Zyrtec 10 mg once a day, alprazolam 1 mg once a day as needed and Advair Diskus 500 1 puff twice a day.   ALLERGIES:  SULFA CAUSING A SKIN RASH AND DEMEROL.   PHYSICAL EXAMINATION: VITAL SIGNS:  Blood pressure 120/69, respiratory rate 18, pulse 62, temperature 97.5, oxygen saturation 95%.  GENERAL APPEARANCE:  Middle-aged male lying down in bed.  He was sleeping when I went in, in no acute distress. HEAD AND NECK:  No pallor.  No icterus.  No cyanosis.  EARS, NOSE, THROAT:  Ear examination revealed normal hearing, no discharge, no lesions.  Nasal mucosa showed no lesions, no discharge.  No bleeding.  Oral mucosa showed no ulcers, no lesions, no oral thrush.  EYES:  Normal iris and conjunctivae.  Pupils about 4 mm, round, equal and reactive to light.  NECK:  Supple.  Trachea at midline.  No thyromegaly.  No cervical lymphadenopathy.  No masses.  HEART:  Normal S1, S2.  No S3, S4.  No murmur.  No gallop.  No carotid bruits.  RESPIRATORY:  Decreased breathing sounds.  No rales.  No wheezing.  ABDOMEN:  Soft without tenderness.  No hepatosplenomegaly.  No masses.  No hernias.  SKIN:  No ulcers.  No subcutaneous nodules.  MUSCULOSKELETAL:  No joint swelling.  No clubbing.  NEUROLOGIC:  Cranial nerves II-XII are intact.  No focal motor deficit.  PSYCHIATRIC:  The patient is alert, oriented x 3.  Mood and affect were normal.   LABORATORY FINDINGS:  EKG showed normal sinus rhythm at rate of 63 per minute.  Poor progression of R waves in the anterior chest leads.  No ischemic abnormalities.  Chest x-ray showed no consolidation, no effusion.  Heart size was upper normal.  There are increased pulmonary markings consistent with his smoking.  Blood glucose 112, BUN 13, creatinine 0.7, sodium 141, potassium 3.6.  CPK 134.  CK-MB fraction 2.2.  Troponin less than 0.02.  CBC showed white count of 8000, hemoglobin 16, hematocrit 48, platelet count  181.   ASSESSMENT: 1.  Chest pain compatible with unstable angina.  2.  Coronary artery disease, status post coronary artery bypass graft in 2002, subsequent to that he had developed more coronary artery disease and had stent implant; the last one was in November 2013 done at [facility]. 3.  Systemic hypertension.  4.  Hyperlipidemia.  5.  Chronic obstructive pulmonary disease.  6.  Tobacco abuse.  7.  Diabetes mellitus, type 2.   PLAN:  The patient will be admitted to the intensive care unit.  Follow up on cardiac enzymes.  The patient received a dose of Lovenox.  I will place him on heparin protocol.  Continue nitrite, aspirin and Plavix in addition to statin.  Cardiology consultation.  At this minute the patient is chest pain-free and lying down on his bed, comfortable.  I  am still awaiting the records from [facility] about the details of his last cardiac catheterization in November 2013, and also the stent implant that was done at that time.  I spent time to advise the patient to quit smoking and he had adverse effects of smoking on his cardiovascular and noncardiovascular systems.  I also offered nicotine patch, but he declined.  That took only 1 minute discussion.   TIME SPENT IN EVALUATING THIS PATIENT:  Took more than 55 minutes.     ____________________________ Clovis Pu. Lenore Manner, MD amd:ea D: 11/30/2012 04:17:48 ET T: 11/30/2012 06:35:08 ET JOB#: [id number]  cc: Clovis Pu. Lenore Manner, MD, <Dictator> Mike Craze Irven Coe MD ELECTRONICALLY SIGNED 12/01/2012 0:32

## 2015-03-08 NOTE — Discharge Summary (Signed)
PATIENT NAME:  Lawrence [name], Lawrence [name] MR#:  [id number] DATE OF BIRTH:  [birth year]  DATE OF ADMISSION:  10/13/2013 DATE OF DISCHARGE:  10/14/2013  DISCHARGE DIAGNOSES:  1.  Unstable angina with percutaneous coronary intervention of left circumflex.  2.  Hypertension.  3.  Diabetes mellitus.  4.  Chronic obstructive pulmonary disease. 5.  Gastroesophageal reflux disease.  6.  Chronic pain syndrome.   PROCEDURES: Cardiac catheterization with PCI of left circumflex.   CONSULTATIONS: Dr. [name].   ADMITTING HISTORY AND PHYSICAL AND HOSPITAL COURSE: Please see detailed H and P dictated previously. In brief, a 59 year old male patient with history of CAD, hypertension, diabetes, admitted to the hospitalist service for unstable angina. The patient had normal cardiac enzymes, were secondary to his continued chest pain, was taken to cardiac cath  lab, where he had PCI and stent placed in his left circumflex by Dr. [name]. The patient will be on aspirin, Plavix, beta blocker, statin, ACE inhibitor and nitrates. By the time of discharge, the patient is chest pain-free. During hospital stay, he had some wheezing, acute bronchitis for which he is on nebulizers at home as needed and prednisone taper for 6 days. Prior to discharge, the patient is chest pain free. Cardiac examination shows S1, S2, without any murmurs. Lungs sounds clear without any wheezing.   DISCHARGE MEDICATIONS: Include:  1.  Lovaza 1000 mg 4 capsules oral once a day.  2.  Nexium 40 mg daily.  3.  Percocet 10/325, 1 tablet oral every 4 hours as needed.  4.  Isosorbide mononitrate extended release 30 mg oral daily.  5.  Cetirizine 10 mg p.o. daily at bedtime.  6.  Alprazolam 1 mg daily.  7.  Xopenex 2 puffs inhaled every 6 hours.  8.   Spiriva 18 mcg inhalation daily.  9.  Aspirin 81 mg daily. 10. Lasix 40 mg daily.  11. Potassium chloride 10 mEq daily.  12. Atorvastatin 80 mg daily.  13. Plavix 75 mg daily.  14. Advair Diskus 500/50  one puff inhaled 2 times a day. 15. Propanolol 50 mg oral 2 times a day.  16. Metformin 500 mg once a day.  17. Hydrochlorothiazide/lisinopril 25/20 oral once a day. 18. Coreg 12.5 mg oral 2 times a day.  19. Nitroglycerin 0.4 sublingual tablet as needed for chest pain.  20. Prednisone 60 mg tapered over 6 days.   DISCHARGE INSTRUCTIONS: Low-sodium, low-fat carbohydrate-controlled diet. Activity as tolerated. Follow up with Dr. [name] and primary care physician in 1 to 2 weeks.   Time spent on day of discharge in discharge activity was 40 minutes.  ____________________________ Leia Alf Tasheika Kitzmiller, MD srs:sw D: 10/16/2013 01:54:39 ET T: 10/16/2013 03:25:37 ET JOB#: [id number]  cc: Alveta Heimlich R. Fabiano Ginley, MD, <Dictator> Neita Carp MD ELECTRONICALLY SIGNED 10/19/2013 2:37

## 2015-03-08 NOTE — H&P (Signed)
PATIENT NAME:  Lawrence [name], Lawrence [name] MR#:  [id number] DATE OF BIRTH:  [birth year]  DATE OF ADMISSION:  10/11/2013  PRIMARY CARE PHYSICIAN: Lawrence Huh, MD  PRIMARY CARDIOLOGIST: At [facility] cardiology.   CHIEF COMPLAINT: Chest tightness, shortness of breath, and cough.  HISTORY OF PRESENT ILLNESS:  Lawrence [name] is a 59 year old Caucasian gentleman with history of coronary artery disease status post CABG and stent in the past, who comes to the Emergency Room after he started noticing increasing shortness of breath for the past 2 days along with a cough with mild productive phlegm. He denies any fever, started feeling chest tightness, took 3 nitroglycerin sublingual. The pain did not subside, came to the Emergency Room where he is hemodynamically stable. Cardiac enzymes, first set negative, and EKG shows sinus bradycardia with heart rate in the 50s. There is no acute ST elevation or depression. The patient received aspirin. He is on Plavix at home. He is being admitted for further evaluation and management.   PAST MEDICAL HISTORY:  1.  Coronary artery disease status post CABG in 2002 and status post stent in OM branch in November 2013 done at [facility].  2.  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  3.  Hypertension.  4.  Tonsillectomy.  5.  Cholecystectomy.  6.  History of bronchitis.  7.  Acid reflux.  8.  History of pancreatitis.  9.  Chronic back pain.  10.  Type 2 diabetes.  11.  DJD.   ALLERGIES: DEMEROL AND SULFA.   MEDICATIONS:  1.  Xopenex 2 puffs every 4 to six hours.  2.  Spiriva 18 mcg inhalation daily.  3.  Plavix 75 mg daily.  4.  Percocet 10/325 one every 4 to six hours.  5.  Nexium 40 mg daily.  6.  Metformin 500 mg p.o. daily.  7.  Lovaza 4 g p.o. daily.  8.  Lasix 40 mg daily.  9.  Klor-Con 10 mEq p.o. daily.  10.  Isosorbide 30 mg p.o. daily.  11.  Hydrochlorothiazide/lisinopril 25/20 p.o. daily.  12.  Coreg 12.5 mg b.i.d.  13.  Cetirizine 10 mg p.o. daily at  bedtime.  14.  Bupropion 150 mg b.i.d.  15.  Atorvastatin 80 mg p.o. daily at bedtime.  16.  Aspirin 81 mg daily.  17.  Alprazolam 1 mg p.o. daily at bedtime.  18.  Advair 500/50 one puff b.i.d.   REVIEW OF SYSTEMS: CONSTITUTIONAL: No fever. Positive for fatigue, weakness.  EYES: No blurred or double vision. No cataracts or glaucoma.  ENT: No tinnitus, ear pain, hearing loss, or epistaxis.  RESPIRATORY: Positive for cough, shortness of breath and COPD.  CARDIOVASCULAR: Positive for chest pain, hypertension. No arrhythmia. Positive for dyspnea on exertion.  GASTROINTESTINAL: No nausea, vomiting, diarrhea, abdominal pain, jaundice, or rectal bleeding.  GENITOURINARY: No dysuria, hematuria, renal calculus or frequency.  ENDOCRINE: No polyuria, nocturia or thyroid problems.  HEMATOLOGY: No anemia or easy bruising or bleeding.  SKIN: No acne or rash or any lesions.  MUSCULOSKELETAL: Positive for arthritis and back pain.  NEUROLOGIC: No CVA, TIA, ataxia or dementia.  PSYCHIATRIC: No anxiety, depression or bipolar disorder.   All other systems reviewed and negative.   FAMILY HISTORY: Positive for hypertension and diabetes.   SOCIAL HISTORY: Lives at home. Quit smoking in July 2014. Denies any history of alcohol use.   PHYSICAL EXAM:  GENERAL: Awake, alert, oriented x3, not in acute distress.  VITAL SIGNS: Afebrile. Pulse is 55. Blood pressure is 134/88. Pulse  oximetry is 97 on room air.  HEENT: Atraumatic, normocephalic. PERRLA. EOM intact. Oral mucosa is moist.  NECK: Supple. No JVD. No carotid bruit.  RESPIRATORY: Clear to auscultation bilaterally. Decreased breath sounds at the bases. Distant breath sounds. The patient has emphysematous chest. No use of accessory muscles. No respiratory distress.  CARDIOVASCULAR: Both the heart sounds are normal. Rate, rhythm regular. PMI not lateralized. Chest is nontender.  EXTREMITIES: Good pedal pulses, good femoral pulses. No lower extremity  edema.  ABDOMEN: Soft, benign, nontender. No organomegaly. Positive bowel sounds.  NEUROLOGIC: Grossly intact cranial nerves II through XII. No motor or sensory deficit.  PSYCHIATRIC: Awake, alert, oriented x3.   LABORATORY DATA: Glucose is 81. BUN is 19. Creatinine 0.9. Sodium is 137. LFTs within normal limits. CBC within normal limits. Troponin less than 0.02.   CHEST X-RAY: No acute abnormality.   EKG shows sinus bradycardia.   ASSESSMENT AND PLAN: Lawrence [name], a 59 year old, with history of coronary artery disease status post coronary artery bypass graft in 2002 and stent placement in obtuse marginal branch in 2013 at [facility] [place], history of hypertension, diabetes, who comes in with:  1.  Chest pain. Appears to be nonspecific; however, given history of coronary artery disease and coronary artery bypass graft, the patient will be admitted for rule out acute myocardial infarction. The patient's first set of cardiac enzymes are negative. We will cycle cardiac enzymes x3, continue aspirin, Plavix, nitroglycerin paste. The case was discussed with Dr. Rockey Situ who will see the patient in consultation. The patient had a stress test in January 2014 here at [facility] which was essentially negative for any new ischemia. He had evidence of some ischemia related to old myocardial infarction scar. Will continue the rest of cardiac medications.  2.  Mild chronic obstructive pulmonary disease exacerbation. The patient has no evidence of pneumonia. His white count is stable. He does not have any fever. I will give him a short course of steroids along with continued inhalers and nebulizer treatment.  3.  Type 2 diabetes. Will continue patient on sliding scale insulin for now. Will resume his metformin at discharge. I will hold it off for now given if the patient's symptoms continue and if cardiology decides on doing a cardiac catheterization. I will hold off the metformin.  4.   Hyperlipidemia. Continue Lovaza and atorvastatin.  5.  Hypertension. Continue hydrochlorothiazide lisinopril and Coreg.  6.  Chronic back pain, on Percocet.  7.  Deep vein thrombosis prophylaxis. Heparin subcu t.i.d.  8.  Further work-up according to the patient's clinical course.   Hospital admission plan was discussed with the patient who is agreeable to it.   TIME SPENT: 50 minutes.   The patient's admission plan was also discussed with Dr. Rockey Situ who agrees with it.    ____________________________ Hart Rochester. Posey Pronto, MD sap:np D: 10/11/2013 18:24:58 ET T: 10/11/2013 19:49:19 ET JOB#: [id number]  cc: Lyell Clugston A. Posey Pronto, MD, <Dictator> [name]. [name], MD Minna Merritts, MD Ilda Basset MD ELECTRONICALLY SIGNED 10/13/2013 10:45

## 2015-03-08 NOTE — Consult Note (Signed)
PATIENT NAME:  Lawrence [name], Lawrence [name] MR#:  [id number] DATE OF BIRTH:  March 12, 1956  DATE OF CONSULTATION:  10/12/2013  REFERRING PHYSICIAN: Dr. Posey Pronto CONSULTING PHYSICIAN:  Corey Skains, MD  REASON FOR CONSULTATION: Chest pain, shortness of breath and unstable angina.   CHIEF COMPLAINT: "I have chest pain."   HISTORY OF PRESENT ILLNESS: This is a 59 year old male with known coronary disease, status post previous coronary artery bypass graft and stenting, hypertension, hyperlipidemia, and COPD, who has had new onset of chest discomfort, substernal, radiating into the left shoulder and into the back. The patient has had this pain for several hours in the evening and has had relief with appropriate medication management and is stable at this time. EKG has shown sinus bradycardia. The patient has had normal troponin, CK-MB without evidence of myocardial infarction. The patient did have a previous myocardial infarction with his previous stenting and coronary artery bypass graft and on appropriate medication management at this time. This includes Plavix, isosorbide, hydrochlorothiazide and carvedilol for his cardiomyopathy and previous myocardial infarction. The patient also is on Lasix for lower extremity edema and previous myocardial infarction. At this time, the patient is comfortable with no further episodes of chest pain and pressure that has escalated throughout the afternoon into the evening and now completely resolved. The patient has had significant COPD of which he is now breathing somewhat better with inhalers. With EKG, troponin and CK-MB being normal, it appears that the patient does not have acute myocardial infarction.   REVIEW OF SYSTEMS: The remainder review of systems negative for vision change, ringing in the ears, hearing loss, cough, congestion, heartburn, nausea, vomiting, diarrhea, bloody stools, stomach pain, extremity pain, leg weakness, cramping of the buttocks, known blood clots,  headaches, blackouts, dizzy spells, nosebleeds, congestion, trouble swallowing, frequent urination, urination at night, muscle weakness, numbness, anxiety, depression, skin lesions, or skin rashes.   PAST MEDICAL HISTORY:  1.  Coronary artery disease.  2.  Bypass surgery.  3.  Stenting.  4.  Hypertension.  5.  Hyperlipidemia.  6.  COPD.   FAMILY HISTORY: Father had a myocardial infarction.   SOCIAL HISTORY: Recently stopped smoking. Denies alcohol use.   ALLERGIES: As listed.   MEDICATIONS: As listed.   PHYSICAL EXAMINATION:  VITAL SIGNS: Blood pressure is 102/63 bilaterally, heart rate 57 upright, reclining, and regular.  GENERAL: He is a well appearing male in no acute distress.  HEENT: No icterus, thyromegaly, ulcers, hemorrhage, or xanthelasma.  CARDIOVASCULAR: Regular rate and rhythm. Normal S1 and S2 without murmur, gallop, or rub. PMI is normal size and placement. Carotid upstroke normal without bruit. Jugular venous pressure is normal.  LUNGS: Few basilar crackles with expiratory wheezes.  ABDOMEN: Soft, nontender, without hepatosplenomegaly or masses. Abdominal aorta is normal size without bruit.  EXTREMITIES: 2+ bilateral pulses in dorsal, pedal, radial and femoral arteries without lower extremity edema, cyanosis, clubbing or ulcers.  NEUROLOGIC: He is oriented to time, place, and person, with normal mood and affect.   ASSESSMENT: A 59 year old male with old myocardial infarction, percutaneous coronary intervention and stent placement, bypass surgery, hypertension, hyperlipidemia, chronic obstructive pulmonary disease with chest pressure pain consistent with unstable angina without evidence of myocardial infarction at this time on appropriate medication management, needing further medication management and treatment options.   RECOMMENDATIONS:  1.  Continue aspirin and Plavix for previous PCI and stent placement and concerns for myocardial infarction.  2.  Continue beta  blocker and ACE inhibitor for previous myocardial infarction and cardiomyopathy  without any current evidence of congestive heart failure.  3.  Furosemide for lower extremity edema and concerns for pulmonary edema.  4.  Diabetes medication management with insulin injection.  5.  Ambulate and follow for any further significant symptoms requiring further intervention and treatment options including the possibility of cardiac catheterization. If the patient has no symptoms at all and is much improved on continued isosorbide, then the patient may be able to discharge to home with followup stress test versus cardiac catheterization depending on worsening symptoms.  6.  Continue treatment of possible exacerbation of COPD causing some of the symptoms that he is having including prednisone and inhalers.  7.  Echocardiogram for LV systolic dysfunction, valvular heart disease and adjustments of medications. 8.  Atorvastatin at high-intensity levels at 80 mg for further risk reduction and cardiovascular event. ____________________________ Corey Skains, MD bjk:aw D: 10/12/2013 07:00:06 ET T: 10/12/2013 07:10:46 ET JOB#: [id number]  cc: Corey Skains, MD, <Dictator> Corey Skains MD ELECTRONICALLY SIGNED 10/24/2013 12:56

## 2015-03-08 NOTE — Discharge Summary (Signed)
PATIENT NAME:  Lawrence [name], Lawrence [name] MR#:  [id number] DATE OF BIRTH:  [birth year]  DATE OF ADMISSION:  11/30/2012 DATE OF DISCHARGE:  11/30/2012  For a detailed note, please take a look at the history and physical done on admission by Dr. Lenore Manner.   DIAGNOSES AT DISCHARGE:   1.  Chest pain, likely musculoskeletal anxiety related.  2.  Chronic obstructive pulmonary disease.  3.  Diabetes.  4.  Hyperlipidemia.  5.  Gastroesophageal reflux disease.   DIET:  The patient is being discharged on American [employer], low sodium, low fat diet.   ACTIVITY:  As tolerated.   FOLLOWUP:  With Dr. [name] in the next 1 to 2 weeks.   DISCHARGE MEDICATIONS:  Lovaza 1000 mg 4 caps daily, Nexium 40 mg daily, Percocet 10/25 mg 1 tab q. 4 to 6 hours as needed, hydrochlorothiazide/lisinopril 12.5/20 mg 1 tab daily, Coreg 12.5 mg daily, Imdur 30 mg daily, cetirizine 10 mg daily, Xanax 1 mg daily at bedtime, Xopenex inhaler 2 puffs q. 6 hours as needed, Spiriva 1 puff daily, aspirin 81 mg daily, Lasix 40 mg daily, potassium 10 mEq daily, atorvastatin 80 mg at bedtime, Plavix 75 mg daily, metformin 1000 mg daily, Advair 500/50 mcg 1 puff b.i.d.   CONSULTANTS DURING THE HOSPITAL COURSE:  Dr. Ida Rogue from cardiology.   PERTINENT STUDIES DONE DURING THE HOSPITAL COURSE:  A chest x-ray done on admission showed interstitial markings apparently mildly increased and pulmonary vascularity is increased and prominent centrally since the previous PA study, low-grade CHF cannot be excluded. A nuclear medicine myocardial scan showing pharmacologic myocardial perfusion imaging study with decreased perfusion of moderate intensity in the mid to distal anterior wall extending into the apical region consistent with old MRI and scar, normal ejection fraction of 61%. No significant ischemia noted. No EKG changes concerning for ischemia.   HOSPITAL COURSE:  This is a 59 year old male with medical problems as mentioned  above presented to the hospital with chest pain.  1.  Chest pain. Given the patient's significant risk factors of diabetes, hyperlipidemia, history of tobacco abuse and COPD, he was observed overnight on telemetry, had 3 sets of cardiac markers checked which were negative, underwent a myocardial scan which showed no evidence of any acute myocardial ischemia. He remained chest pain-free throughout the hospital course otherwise and therefore was discharged home. His chest pain was likely related to anxiety or musculoskeletal pain.  2.  COPD. He had no evidence of an acute COPD. He will continue his Spiriva, Xopenex inhaler and nebulizer treatments as stated.  3.  Diabetes. No evidence of any acute hypoglycemic episodes. The patient was told to hold his metformin for the next 48 hours, but he can resume that after for his diabetes.  4.  Hyperlipidemia. The patient was maintained on his Lovaza and he will resume that.  5.  GERD. The patient was maintained on his Nexium and he will resume that.  6.  Anxiety. Continue with his Xanax as stated.  7.  History of coronary artery disease. The patient will continue his Plavix, statin, Coreg and Imdur as stated.   CODE STATUS:  The patient is a full code.   TIME SPENT ON DISCHARGE:  30 minutes.    ____________________________ Belia Heman. Verdell Carmine, MD vjs:si D: 12/08/2012 16:49:00 ET T: 12/08/2012 20:27:48 ET JOB#: [id number]  cc: Belia Heman. Verdell Carmine, MD, <Dictator> [name]. [name], MD Henreitta Leber MD ELECTRONICALLY SIGNED 12/09/2012 11:11

## 2015-03-09 NOTE — H&P (Signed)
PATIENT NAME:  Lawrence [name], Lawrence [name] MR#:  [id number] DATE OF BIRTH:  [birth year]  DATE OF ADMISSION:  12/03/2013  REFERRING PHYSICIAN: Dr. Thomasene Lot.   PRIMARY CARE PHYSICIAN: Dr. [name].  CARDIOLOGIST:  Dr. [name].   CHIEF COMPLAINT: Chest pain.   HISTORY OF PRESENT ILLNESS: A 59 year old Caucasian gentleman with past medical history of coronary artery disease status post CABG, presenting with chest pain. Describes acute onset of chest pain which occurred at rest and located over left chest, intensity 10 out of 10, pressure, nonradiating. No worsening or relieving factors. The patient attempted to take nitroglycerin; however,  with minimal relief. Thus, symptoms have returned. He has had chest pain, which has been persistent for approximately 4 to 5 hours in total stating the pain is now an 8 out of 10 after receiving nitroglycerin x 3. He had associated shortness of breath; however, denies any diaphoresis, or palpitations. Currently, complaining of chest pain, though sitting comfortably in bed.   REVIEW OF SYSTEMS:  CONSTITUTIONAL: Denies fever, fatigue, weakness.  EYES: Denies blurry vision,  double vision, eye pain.  EARS, NOSE, THROAT: Denies tinnitus, ear pain, hearing loss.  RESPIRATORY: Positive for shortness of breath. Denies cough, wheeze.  CARDIOVASCULAR: Positive for chest pain, denies any palpitations, edema.  GASTROINTESTINAL: Denies nausea, vomiting, diarrhea, abdominal pain.  GENITOURINARY: Denies dysuria, hematuria.  ENDOCRINE: Denies nocturia or thyroid problems. HEMATOLOGIC: Denies easy bruising, bleeding.  SKIN: Denies rash or lesions.  MUSCULOSKELETAL: Denies pain in neck, back, shoulder, knees, hips, arthritic symptoms.  NEUROLOGIC: Denies paralysis, paresthesias. PSYCHOLOGICAL: Denies anxiety or depressive symptoms.  Otherwise, full review of systems performed  is negative.   PAST MEDICAL HISTORY: Type 2 diabetes, non-insulin-requiring, hyperlipidemia, COPD, non-O2  dependent, esophageal reflux disease, coronary artery disease status post CABG, hypertension, recent cardiac catheterization November 2014 revealing residual disease, LAD 20% to 25%, left circumflex 90% and 95%, receiving drug-eluting stent and 95% lesion, right coronary had 40%, SVG to obtuse marginal had 85% lesion as well.   SOCIAL HISTORY: Remote tobacco use. Last use one year. Denies alcohol, drug use.   FAMILY HISTORY: Positive for diabetes, COPD.   ALLERGIES: DEMEROL AND SULFA DRUGS.   HOME MEDICATIONS: Include aspirin 81 mg p.o. daily, Imdur 30 mg p.o. daily, and nitroglycerin 0.4 mg sublingual tablets as needed for chest pain, metformin 500 mg p.o. daily, cetirizine  10 mg p.o. daily, atorvastatin 80 mg p.o. daily, hydrochlorothiazide/lisinopril p.o. daily, Plavix 75 mg daily, alprazolam 1 mg p.o. at bedtime, Coreg 12.5 mg p.o. b.i.d., Advair 500/50 mcg 2 puffs inhalation b.i.d., Spiriva 18 mcg inhalation daily, Xopenex 45 mcg inhalation 2 puffs every six hours as needed for shortness of breath, Lasix 40 mg daily, potassium 10 mEq p.o. daily,  Nexium 40 mg p.o. daily, bupropion 150 mg p.o. b.i.d.    PHYSICAL EXAMINATION: VITAL SIGNS: Temperature 97.8, heart rate 64, respirations 18, blood pressure 167/95, saturating 98% on room air. Weight 91.6 kg, BMI 31.7.  GENERAL: Well-nourished, well-developed, Caucasian male currently in no acute distress.  HEAD: Normocephalic, atraumatic.  EYES: Pupils equal, round and reactive to light. Extraocular muscles intact. No scleral icterus.  MOUTH: Moist mucous membranes.good dentition. No abscess noted.  EAR, NOSE, THROAT: Clear without exudates. No external lesions.  NECK: Supple. No thyromegaly. No nodules. No JVD.  PULMONARY: Clear to auscultation bilaterally. No wheezes or rhonchi. No use of accessory muscles. Good respiratory effort.  CHEST: Nontender to palpation.  CARDIOVASCULAR: S1, S2, regular rate and rhythm. No murmurs, rubs or gallops. No  edema.  Pedal pulse 2+ bilaterally.  GASTROINTESTINAL: Soft, nontender, nondistended. No masses. No hepatosplenomegaly.  MUSCULOSKELETAL: No swelling, clubbing, edema. Range of motion full in all extremities.   NEUROLOGIC: Cranial nerves II through XII intact .  No focal neurological deficits. sensation/reflexes intact  SKIN: No ulcerations, lesions, rashes, cyanosis. Skin warm, dry. Turgor normal limits PSYCHIATRIC: Mood and affect normal. The patient awake  and oriented x 3.   LABORATORY DATA: EKG performed revealing normal sinus rhythm, heart rate 60. No ST or T wave abnormalities. Sodium 140, potassium 3.7, chloride 104, bicarbonate 31, BUN 14, creatinine 0.85, glucose 177, troponin I less than 0.02. WBC 7.4, hemoglobin 14.9, platelets 187.   ASSESSMENT AND PLAN: A 59 year old Caucasian male with history of coronary artery disease presenting with chest pain.  1. Chest pain while at rest. Admit to observation telemetry, give aspirin and statin. Trend cardiac enzymes x 3. Consult cardiologist, Dr. [name] as the chest pain is ongoing. We will apply nitro paste and morphine if needed. He does have residual lesions though troponin and EKG are within normal limits at this time. We will discuss the options with heparin if required.  2. Coronary artery disease status post coronary artery bypass grafting. aspirin and statin beta blocker therapy, as well as ACE inhibitors.  3. Hypertension. Continue beta blocker, ACE inhibitor and hydrochlorothiazide.  4. Chronic obstructive pulmonary disease. Continue Spiriva and Advair, supplemental O2 as required to keep oxygen saturation greater than 92%.  5. Gastroesophageal reflux disease. Proton pump inhibitor therapy.  6. Deep vein thrombosis prophylaxis with heparin subcutaneous.  The patient FULL CODE.  Time spent: 45 minutes.   ____________________________ Aaron Mose. Hower, MD dkh:sg D: 12/03/2013 23:40:26 ET T: 12/04/2013 09:31:05  ET JOB#: [id number]  cc: Aaron Mose. Hower, MD, <Dictator> DAVID Woodfin Ganja MD ELECTRONICALLY SIGNED 12/05/2013 1:14

## 2015-03-09 NOTE — Consult Note (Signed)
Chief Complaint:  Subjective/Chief Complaint Pt staes continued cp and sob . S/P cath with minimal CAD   VITAL SIGNS/ANCILLARY NOTES: **Vital Signs.:   09-Jul-15 14:00  Vital Signs Type Routine  Temperature Temperature (F) 98.4  Celsius 36.8  Temperature Source oral  Pulse Pulse 61  Respirations Respirations 9  Systolic BP Systolic BP 761  Diastolic BP (mmHg) Diastolic BP (mmHg) 66  Mean BP 84  Pulse Ox % Pulse Ox % 96  Oxygen Delivery 2L; Nasal Cannula  *Intake and Output.:   09-Jul-15 15:00  Grand Totals Intake:  100 Output:      Net:  100 24 Hr.:  300  IV (Primary)      In:  100   Brief Assessment:  GEN well developed, well nourished, no acute distress   Cardiac Regular  no murmur  --Rub   Respiratory normal resp effort  clear BS   Gastrointestinal Normal   Gastrointestinal details normal Soft  Nontender  Nondistended  No masses palpable  Bowel sounds normal   EXTR negative cyanosis/clubbing, negative edema   Lab Results: Cardiology:  08-Jul-15 06:59   ECG interpretation Marked sinus bradycardia Abnormal ECG When compared with ECG of 04-Dec-2013 06:49, No significant change was found Confirmed by PARASCHOS, ALEX ([id number]) on 05/23/2014 12:21:25 PM  Overreader: Miquel Dunn  Ventricular Rate 43  Atrial Rate 43  P-R Interval 186  QRS Duration 96  QT 472  QTc 398  P Axis 49  R Axis 23  T Axis 61  Routine Chem:  08-Jul-15 04:15   Cholesterol, Serum 92  Triglycerides, Serum 109  HDL (INHOUSE)  28  VLDL Cholesterol Calculated 22  LDL Cholesterol Calculated 42 (Result(s) reported on 23 May 2014 at 06:04AM.)  Cardiac:  08-Jul-15 04:18   Troponin I < 0.02 (0.00-0.05 0.05 ng/mL or less: NEGATIVE  Repeat testing in 3-6 hrs  if clinically indicated. >0.05 ng/mL: POTENTIAL  MYOCARDIAL INJURY. Repeat  testing in 3-6 hrs if  clinically indicated. NOTE: An increase or decrease  of 30% or more on serial  testing suggests a  clinically important change)   CK, Total 93 (39-308 NOTE: NEW REFERENCE RANGE  12/18/2013)  CPK-MB, Serum 1.7 (Result(s) reported on 23 May 2014 at 04:52AM.)   Radiology Results: XRay:    07-Jul-15 19:00, Chest Portable Single View  Chest Portable Single View   REASON FOR EXAM:    Chest Pain  COMMENTS:       PROCEDURE: DXR - DXR PORTABLE CHEST SINGLE VIEW  - May 22 2014  7:00PM     CLINICAL DATA:  Chest pain.    EXAM:  PORTABLE CHEST - 1 VIEW    COMPARISON:  12/03/2013 and 10/11/2013.    FINDINGS:  1854 hr.The heart size and mediastinal contours are stable status  post CABG. The lungs are hyperinflated but clear. There is no  pleural effusion or pneumothorax. No acute osseous findings are  evident.     IMPRESSION:  Stable postoperative chest.  No acutecardiopulmonary process.      Electronically Signed    By: Camie Patience M.D.    On: 05/22/2014 19:27         Verified By: Vivia Ewing, M.D.,  Cardiac Catherization:    09-Jul-15 08:52, Cardiac Catheterization  Cardiac Catheterization   [facility]  [place], [place] [address]  [phone]     Cardiovascular Catheterization Comprehensive Report     Patient: Lawrence [name]  Study date: 05/24/2014  MR number: [id number]  Account number: [id number]     DOB: December 22, 1955  Age: 59 years  Gender: Male  Race: White  Height: 66.1 in  Weight: 201.5 lb     Diagnostic Cardiologist:  Lujean Amel, MD     SUMMARY:     -Summary: Normal LVF  EF=55%  Cors  Lmain OK  LAD 25% prox/mid stents patent  circ 50/75% ostial  OM1 grafted  RCA minor irreg  LIMA ->LAD atretic  Svg OM1 75% ostial 50 in body  Rec  Medical therapy for now/consider intervention at medical center if  clinically indicated     CORONARY CIRCULATION: The coronary circulation is right dominant.  Proximal LAD: There was a diffuse 20 % stenosis at the site of a  prior stent. Mid LAD: There was a diffuse 25 % stenosis at the site  of a prior stent.  Ostial circumflex: There was a 50 % stenosis.  Proximal circumflex: There was a diffuse 0 % stenosis at thesite of  a prior stent. Graft to the mid LAD: The graft was a LIMA. There was  a 100 % stenosis at the graft ostium. Graft to the 1st obtuse  marginal: The graft was a saphenous vein graft from the aorta. There  was a 75 % stenosis at the proximal anastomosis. There was a 50 %  stenosis in the middle third of the graft.     VENTRICLES: There were no left ventricular global or regional wall  motion abnormalities. The left ventricle was normal in size.     VALVES: AORTIC VALVE: The aortic valve wasevaluated by left  ventriculography. The aortic valve appeared to be structurally  normal. The aortic valve leaflets exhibited normal thickness and  normal excursion. There was no aortic stenosis. MITRAL VALVE: The  mitral valve was evaluated by leftventriculography. The mitral valve  appeared grossly normal. The mitral leaflets exhibited normal  thickness and normal excursion. The mitral valve exhibited no  regurgitation.     INDICATIONS: Angina/MI: unstable angina.     HISTORY: There was no prior diagnosis of congestive heart failure. The  patient has hypertension and a history of current cigarette use.  There was no history of cerebrovascular disease, peripheral arterial  disease, chronic lung disease, or diabetes. There was no family  history of coronary artery disease. PRIOR CARDIOVASCULAR PROCEDURES:  No history of valve surgery.     PRIOR DIAGNOSTIC TEST RESULTS: No prior stress test is available. The  following pre-procedure tests were not performed: stress  echocardiogram, stress nuclear, stress test with cardiac magnetic  resonance, cardiac computerized tomographic angiography, or calcium  score.     PROCEDURES PERFORMED: Bypass graft angiography. Left internal mammary  angiography. Procedure: Left Heart Cath with LVgram and Grafts  Procedure: Arterial Occlusive Device      COMPLICATIONS: No complication occurred during the cath lab visit.     PROCEDURE: The risks and alternatives of the procedures and conscious  sedation were explained to the patient and informed consent was  obtained. The patient was brought to the cath lab and placed on the  table. The planned puncture sites were prepped and draped in the  usual sterile fashion. Cardiac catheterization performed electively.     -Right femoral artery access. The vessel was accessed, a wire was  threaded into the vessel, and a was advanced over the wire into the  vessel.     -Graft angiography. A catheter was advanced to the aorta.     -Left internal  mammary angiography. A catheter was positioned.     -Left Heart Cath with LVgram and Grafts.     -Arterial Occlusive Device.     PROCEDURE COMPLETION: TIMING: Test started at 09:02. Test concluded at  09:28. RADIATION EXPOSURE: Fluoroscopy time: 5.75 min. Fluoroscopy  dose: 1.526 Gray.  MEDICATIONS GIVEN: Midazolam, 1 mg, IV, at 08:55. Midazolam, 1 mg, IV,  at 09:04.  CONTRAST GIVEN: Isovue 130 ml.     Prepared and signed by     Lujean Amel, MD  Signed 06/07/2014 20:05:09     STUDY DIAGRAM     Angiographic findings  Native coronary lesions:   Proximal LAD: Lesion 1: diffuse, 20 % stenosis, site of prior stent.   Mid LAD: Lesion 1: diffuse, 25 % stenosis, site of prior stent.   Ostial circumflex: Lesion 1: 50 % stenosis.  Proximal circumflex: Lesion 1: diffuse, 0 % stenosis, site of prior  stent.     Coronary graft lesions:   Graft to mid LAD: LIMA    100 % stenosis at graft ostium.      Graft to OM1: SVG    75 % stenosis at proximal anastomosis.    Mid 1/3 lesion 1: 50 % stenosis in mid graft.     HEMODYNAMIC TABLES     Pressures:  Baseline  Pressures:  - HR: 64  Pressures:  - Rhythm:  Pressures:  -- Aortic Pressure (S/D/M): 133/63/81  Pressures:  -- Left Ventricle (s/edp): 126/9/--     Outputs:  Baseline  Outputs:  --  CALCULATIONS: Age in years: 58.38  Outputs:  -- CALCULATIONS: Body SurfaceArea: 2.01  Outputs:  -- CALCULATIONS: Height in cm: 168.00  Outputs:  -- CALCULATIONS: Sex: Male  Outputs:  -- CALCULATIONS: Weight in kg: 91.60  Cardiology:    07-Jul-15 18:46, ECG  Ventricular Rate 62  Atrial Rate 62  P-R Interval 174  QRS Duration 90  QT 410  QTc 416  P Axis 44  R Axis 16  T Axis 45  ECG interpretation   Normal sinus rhythm  Normal ECG  When compared with ECG of 04-Dec-2013 06:49,  No significant change was found  ----------unconfirmed----------  Confirmed by OVERREAD, NOT ([id number]), editor PEARSON, BARBARA ([id number]) on 05/23/2014 12:22:51 PM  ECG     08-Jul-15 06:59, ECG  Ventricular Rate 43  Atrial Rate 43  P-R Interval 186  QRS Duration 96  QT 472  QTc 398  P Axis 49  R Axis 23  T Axis 61  ECG interpretation   Marked sinus bradycardia  Abnormal ECG  When compared with ECG of 04-Dec-2013 06:49,  No significant change was found  Confirmed by PARASCHOS, ALEX ([id number]) on 05/23/2014 12:21:25 PM    Overreader: PARASCHOS, ALEX  ECG     10-Jul-15 10:02, Stress Test For Nuclear  Protocol LEXISCAN  Max Work Load 10  Total Exercise Time 60  Max Diastolic BP 84  Max Heart Rate 92  Max Predicted Heart Rate 160  Reason For Termination   per lexiscan protocol  ECG interpretation   Confirmed by OVERREAD, NOT ([id number]), editor PEARSON, BARBARA ([id number]) on 05/25/2014 12:48:51 PM  Stress Test   Nuclear Med:    10-Jul-15 11:00, NM MYOCARDIAL SCAN  NM MYOCARDIAL SCAN   REASON FOR EXAM:    Chest pain  COMMENTS:       PROCEDURE: NM  - NM MYOCARDIAL SCAN  - May 25 2014 11:00AM     RESULT: Nuclear Cardiology Report    Patient  Demographics         Name: Lawrence [name]           Study Date: 05/25/2014   Patient ID: [id number]                   Report Date: 05/25/2014          DOB: Dec 03, 1955 Age: 59 years       Height:          Sex: M                             Weight:  Accession #: [id number]                         Race:    Ordering Physician: [place] MD  Diagnosing Physician: [id number] Lujean Amel MD  Indications  The patient was imaged for the following indications:  Assessment of acute chest pain.    Clinical History  [age] year old M with known coronary artery disease.  Cardiac risk factors include: hypertension, hypercholesterolemia, history   of smoking, history of coronary artery disease in the family, age and   diabetes.  Images were obtained using Rest Tc-36mstress Tc-950m day protocol.    Procedure  Pharm stress protocol was followed due to the patient was unable to   exercise. Patient was injected with .4 mg Regadenoson.  The test was terminated due to End of Protocol. The heart rate was 58     beats per minute at rest and increased to 92 beats at peak exercise,   which was 57 % of the maximum predicted heart rate. The resting blood   pressure was 155/84 mm/Hg and the stress blood pressure was 144/6864mg.  Myocardial perfusion imaging was performed following the injection of   14.19 MCi of 59m29mestamibi at 8:38.  The patient was injected with 31.754 MCi of 59mT48mstamibi at 10:06 for   the stress portion of the exam.    Stress Test Findings    The following table summarizes the findings:  +-------------+-------------------+-------------------+                 STRESS EKG DATA    REST EKG DATA     +-------------+-------------------+-------------------+  Test Status  Normal             Normal               +-------------+-------------------+-------------------+  Rhythm       Normal sinus rhythmNormal sinus rhythm  +-------------+-------------------+-------------------+  IV ConductionNormal             Normal               +-------------+-------------------+-------------------+  Arrhythmias                     None                 +-------------+-------------------+-------------------+  ST Response  Normal                                   +-------------+-------------------+-------------------+    Overall Impression  The overall study imaging quality was deemed to be good.  The left ventricular global function was normal.  This myocardial perfusion scan showed evidence of pathology and has an   abnormal appearance.  The stress to rest volume ratio is 0.85 for the left     ventricle.  Pharmacological myocardial perfusion study with small region of mild scar   in the apical and anterior territory.  There is attenuation artifact noted on this study.  No significant wall motion abnormality noted.  The estimated ejection fraction is 72%.  There are no EKG changes concerning for ischemia.  Overall, this is a Low risk scan.  Clinical correlation is recommended.  This patient should be considered for an alternate imaging study such as   MRA.    Summary   1. Clinical correlation is recommended.   2. No significant wall motion abnormality noted.   3. Overall, this is a Low risk scan.   4. Pharmacological myocardial perfusion study with region of mild scar   in the apical and anterior territory.   5. The estimated ejection fraction is 72%.   6. The left ventricular global function was normal.   7. There are no EKG changes concerning for ischemia.   8. There is attenuation artifact noted on this study.   9. This patient should be considered for an alternate imaging study such   as MRA.  Diagnosing Physician: [place] MD  Electronically signed at 1:54:37 PM on 05/25/2014    *** Final ***    IMPRESSION: .    Verified By: Yolonda Kida, M.D., MD   Assessment/Plan:  Assessment/Plan:  Assessment IMP Angina Chest Pain Hypertension COPD Diabetes CAD GERD .   Plan PLAN ASA 81 mg poQD Continue inhalers for COPD Agree with DM control Continue HTN control Agree with omeprazole for GERD D/C NTG for none cardiac CP Pain control as necessary F/U as outpt   Electronic Signatures: Lujean Amel  D (MD)  (Signed 11-Sep-15 11:36)  Authored: Chief Complaint, VITAL SIGNS/ANCILLARY NOTES, Brief Assessment, Lab Results, Radiology Results, Assessment/Plan   Last Updated: 11-Sep-15 11:36 by Yolonda Kida (MD)

## 2015-03-09 NOTE — Discharge Summary (Signed)
PATIENT NAME:  Lawrence [name], Lawrence [name] MR#:  [id number] DATE OF BIRTH:  03/11/1956  DATE OF ADMISSION:  05/22/2014 DATE OF DISCHARGE:  05/25/2014  PRIMARY CARE PHYSICIAN: Dr. [name].   CONSULTATION: Dr. Clayborn Bigness, cardiology.  PROCEDURE: Cardiac catheterization.   DISCHARGE DIAGNOSES:  1.  Atypical chest pain. 2.  Coronary artery disease. 3.  Chronic obstructive pulmonary disease. 4.  Diabetes.   CONDITION: Stable.   CODE STATUS: FULL CODE.   HOME MEDICATIONS: Please refer to the medication reconciliation list.   DIET: Low-sodium, low-fat, low-cholesterol, ADA diet.   ACTIVITY: As tolerated.   FOLLOW-UP CARE:  Follow up with PCP within 1-2 weeks. Follow up with Dr. Clayborn Bigness within 1-2 weeks.   REASON FOR ADMISSION: Chest pain.   HOSPITAL COURSE: The patient is a 59 year old Caucasian male with a history for diabetes, hyperlipidemia, chronic obstructive pulmonary disease, and coronary artery disease who presented to the ED with chest pain, retrosternal location, radiation to the left arm, pressure in quality 9/10 in intensity, associated with shortness of breath. For detailed history and physical examination, please refer to the admission note dictated by Dr. Lavetta Nielsen. The patient's EKG showed normal sinus rhythm.   LABORATORY DATA:  Unremarkable.  Troponin level has been negative for 3 sets. Chest x-ray did not show any acute cardiopulmonary process.   HOSPITAL COURSE:  1.  Atypical chest pain. The patient was suspected to have unstable angina, has been treated with aspirin, statin, Heparin drip, and Nitrol drip and admitted to CCU.  Dr. Clayborn Bigness evaluated the patient, did a cardiac catheterization, which did not show any significant changes from previous findings.  Dr. Clayborn Bigness suggested the patient had typical chest pain and may be treated with narcotics or nonsteroidal antiinflammatory drugs, but he suggested a stress test today, which is normal. The patient has no chest pain today.   2.   Diabetes. Has been treated with sliding scale.   3.  Chronic obstructive pulmonary disease, stable, treated with Advair, Spiriva, and nebulizer.   The patient has no complaints. Vital signs are stable. He is clinically stable and will be discharged to home today. I discussed patient's discharge plan with the patient,  patient's wife, Dr. Clayborn Bigness nurse, and case manager.   TIME SPENT: About 38 minutes.    ____________________________ Demetrios Loll, MD qc:ts D: 05/25/2014 14:08:24 ET T: 05/25/2014 16:44:19 ET JOB#: [id number]  cc: Demetrios Loll, MD, <Dictator> Demetrios Loll MD ELECTRONICALLY SIGNED 05/26/2014 16:08

## 2015-03-09 NOTE — Discharge Summary (Signed)
PATIENT NAME:  Lawrence [name], Lawrence [name] MR#:  [id number] DATE OF BIRTH:  1956-10-17  DATE OF ADMISSION:  12/03/2013 DATE OF DISCHARGE:  12/06/2013  ADMITTING DIAGNOSIS: Chest pain.   DISCHARGE DIAGNOSES: 1.  Chest pain, noncardiac in origin due to severe gastritis.  2.  Coronary artery disease, status post coronary artery bypass graft with a catheterization during this hospitalization, which is unchanged compared to previous.  3.  Hypertension.  4.  Chronic obstructive pulmonary disease without any evidence of acute exacerbation.  5.  Gastroesophageal reflux disease.   6.  Hyperlipidemia.   PERTINENT LABS AND EVALUATIONS: Admitting glucose 117, BUN 14, creatinine 0.85, sodium 140, potassium 3.7, chloride 104, CO2 is 31, calcium is 9.1, cholesterol shows LDL 46. Cholesterol 104, triglycerides 86, HDL 41. Troponin less than 0.02 x 3. WBC 7.4, hemoglobin 14.9, platelet count 187. EKG showed sinus bradycardia without any ST-T wave changes. Echocardiogram of the heart showed EF of 45% to 50%, normal global left ventricular systolic function, impaired relaxation pattern of LV diastolic filling, cardiac cast shows chronically atretic LIMA to LAD, eccentric 95% stenosis proximal saphenous vein graft to OM, patent stent mid LAD, patent stent proximal left circumflex. No significant disease RCA. EGD showed gastritis with some hemorrhage, erythematous duodenopathy, erythema at the GE junction, normal second part of the duodenum.   HOSPITAL COURSE: Please refer to H and P done by the admitting physician. The patient is a 59 year old white male with previous history of coronary artery disease, previous stent, presented with chest pain as well as left upper quadrant abdominal pain. The patient's symptoms initially were concerning for unstable angina. He was initially worked up for cardiac cause with a cardiac catheterization.  The cath was nonrevealing. The patient was requiring frequent IV pain medications; therefore, a  GI consult was obtained. He underwent EGD which showed findings consistent with severe gastritis. He was treated with PPIs and Carafate with significant improvement in his symptoms. The patient is doing much better and is stable for discharge.   DISCHARGE MEDICATIONS: Lovaza 1000 mg 4 capsules daily, Percocet 10/325 mg 1 tab p.o. q. 4 to 6 hours p.r.n. for pain, isosorbide mononitrate 30 daily, cetirizine 10 at bedtime, Xopenex inhalation q. 6 p.r.n., Spiriva 18 mcg daily, aspirin 81 mg 1 tab p.o. daily, Lasix 40 daily, Klor-Con 10 mEq daily, atorvastatin 80 at bedtime, Plavix 75 p.o. daily, Advair Diskus 500/50 one puff b.i.d., bupropion 150 mg 1 tab p.o. b.i.d., metformin 500 mg 1 tab p.o. daily, HCTZ, lisinopril 25/20 daily, Coreg 12.5 mg 1 tab p.o. b.i.d., nitroglycerin 0.4 sublingual p.r.n. for chest pain, alprazolam 1 mg 1 tab p.o. t.i.d., Tylenol 650 q. 4 p.r.n., Protonix 40 mg 1 tab p.o. b.i.d. sucralfate 1 gram t.i.d.   DIET: Low sodium, low fat, low cholesterol, carbohydrate-controlled diet.   ACTIVITY: As tolerated.   FOLLOW-UP:  With primary M.D. in 1 to 2 weeks. Follow with Dr. Vira Agar in 4 to 6 weeks.   TIME SPENT ON DISCHARGE:  35 minutes    ____________________________ Chana Bode H. Posey Pronto, MD shp:dp D: 12/08/2013 08:21:16 ET T: 12/08/2013 08:34:35 ET JOB#: [id number]  cc: Miasia Crabtree H. Posey Pronto, MD, <Dictator> Alric Seton MD ELECTRONICALLY SIGNED 12/08/2013 17:09

## 2015-03-09 NOTE — Consult Note (Signed)
PATIENT NAME:  Lawrence [name], Lawrence [name] MR#:  [id number] DATE OF BIRTH:  13-Jun-1956  DATE OF CONSULTATION:  12/05/2013  REFERRING PHYSICIAN:  Dr. Posey Pronto CONSULTING PHYSICIAN:  Dr. Gaylyn Cheers, gastroenterology/[facility] M. Zettie Pho, PA-C  ATTENDING GASTROENTEROLOGIST:  Dr. Vira Agar  REASON FOR CONSULTATION: Noncardiac chest pain.  HISTORY OF PRESENT ILLNESS: This is a pleasant 59 year old gentleman who has been admitted since 01/18 with chest pain. He does have a past medical history significant for coronary artery disease and he has undergone extensive work up both through internal medicine and cardiology to rule out any potential cardiac etiologies. Thus far, troponins and cardiac enzymes have been negative and he did undergo multiple EKGs, as well as a cardiac catheterization earlier today that was unremarkable for any acute changes. He did undergo a catheterization back in November 2014 where he ended up getting a drug alluding stent. He is currently on aspirin and Plavix and has been receiving heparin throughout his hospitalization q. 8 hours.  Of note, he has past medical history significant for GERD and hiatal hernia that was documented on an EGD performed by Dr. Gaylyn Cheers back pain in March 2003. There is grade A esophagitis, gastritis and duodenitis with a small hiatal hernia. Since that time, the patient has been taking once a day Nexium. He explains that it was recently increased to twice a day and currently in the hospital he is receiving Protonix 40 mg twice daily p.o. He does report daily heartburn and indigestion-like symptoms with his chest pain. There is no nausea or vomiting. There is occasional dysphagia. He does have a chronic cough, but he also has a past medical history significant for COPD. He denies any current shortness of breath. No palpitations. No changes to his bowel habits. No diarrhea, constipation, bright red bright per rectum or melena. No significant NSAID use, though he is on  chronic blood thinners. No unintentional weight changes. No fever or chills. Of note, the patient's last EGD was in 2003 and he has never had a colonoscopy.   PAST MEDICAL HISTORY: Coronary artery disease, diabetes mellitus type 2, dyslipidemia, COPD that is not oxygen dependent, had a hernia and GERD.   PAST SURGICAL HISTORY: CABG with drug eluting stent.   SOCIAL HISTORY: The patient has a remote history of tobacco use, quitting about 1 year ago. No current alcohol, tobacco or illicit drug use.   FAMILY HISTORY: No known family history of GI malignancy, colon polyps or IBD.   ALLERGIES: DEMEROL AND SULFA.   HOME MEDICATIONS: Aspirin, Imdur, nitroglycerin, metformin, cetirizine, atorvastatin, hydrochlorothiazide/lisinopril, Plavix, Coreg, alprazolam, Spiriva, Advair, Lasix, potassium, Nexium, Xopenex and bupropion.   REVIEW OF SYSTEMS: A 10-system review of systems review was obtained on the patient. Pertinent positives are mentioned above and otherwise negative.   OBJECTIVE: VITAL SIGNS: Blood pressure 109/67, heart rate 62, respirations 18, temperature 98.2, bedside pulse ox 93%.  GENERAL: This is a pleasant 59 year old gentleman resting quietly and comfortably in bed in no acute distress, alert and oriented x 3.  HEAD: Atraumatic, normocephalic.  NECK: Supple. No lymphadenopathy noted.  HEENT: Sclerae anicteric. Mucous membranes moist.  PULMONARY: Respirations are even and unlabored. Clear to auscultation in bilateral anterior lung fields.  CARDIAC: Regular rate and rhythm. S1, S2 noted. Midline chest scar anteriorly is noted.  ABDOMEN: Soft, nontender, nondistended. Normoactive bowel sounds are noted in all 4 quadrants. No guarding or rebound. No masses, hernias or organomegaly appreciated.  RECTAL: Deferred.  PSYCHIATRIC: Appropriate mood and affect.  EXTREMITIES: Negative for  lower extremity edema, 2+ pulses noted bilaterally.   LABORATORY DATA: White blood cells 7.6, hemoglobin  15.9, hematocrit 48.5, platelets 194. Sodium 140, potassium 3.9, BUN 17, creatinine 0.92, glucose 105. Lipid panel was within normal limits. PT 13.5. INR 1.0. Troponin negative. CPK negative. Cardiac enzymes negative.  Cardiac catheterization today without acute findings per the patient.   OTHER DATA:  The patient underwent an EGD by Dr. Vira Agar 02/01/2002 for epigastric pain, notable for grade A esophagitis, gastritis and duodenitis.   EGD by Dr. Vira Agar 03/19/2002 revealed esophagitis at the GE junction with gastritis and duodenitis.   ASSESSMENT: 1.  Noncardiac chest pain.  2.  History of coronary artery disease, status post coronary artery bypass graft and a drug eluting stent placed November 2014, currently on aspirin and Plavix.  3.  History of gastroesophageal reflux disease and reflux esophagitis. Last EGD was in 2003. The patient continues to complain of heartburn and indigestion-like symptoms.  4.  History of chronic obstructive pulmonary disease that is not oxygen dependent.   PLAN: I spent quite a bit of time reviewing the patient's medical records including prior EGDs and his current hospital course. At this time, because his chest pain has been deemed noncardiac, we do feel an appropriate next step would be to proceed with an EGD to evaluate esophagogastric etiologies. Alternatives, risks and benefits of the EGD were outlined in detail for the patient who verbalized understanding and is in agreement to proceed. We will have him hold heparin prior to EGD and keep him n.p.o. after midnight tonight so we can proceed with the EGD tomorrow 12/06/2013 with Dr. Vira Agar. We agree with him being on Protonix 40 mg twice daily for now. We will also obtain an abdominal ultrasound in the morning to rule out a potential gallbladder etiology as well. This is explained to the patient who verbalized understanding and all questions were answered. We will continue to monitor this patient throughout  hospitalization and make further recommendations pending the EGD and per clinical course.   Thank you so much for this consultation and for allowing [place] to participate in the patient's plan of care.   These services provided by Loren Racer, PA-C under collaborative agreement with Dr. Gaylyn Cheers, M.D.    ____________________________ Corky Sox. Zettie Pho, PA-C kme:ce D: 12/05/2013 16:32:36 ET T: 12/05/2013 17:01:42 ET JOB#: [id number]  cc: Corky Sox. Elisavet Buehrer, PA-C, <Dictator> [place] PA ELECTRONICALLY SIGNED 12/07/2013 16:33

## 2015-03-09 NOTE — Consult Note (Signed)
Patient with diffuse gastritis with some areas with erosions and blood in one area.  No active bleeding, no large ulcers, erythema at GEJ.  He can take Protonix generic bid and carafate one gram before meals and eat very soft food, start with full liquids and advance to mechanical soft.  PRN antacids ok also.  OK to go home.  Electronic Signatures: Manya Silvas (MD)  (Signed on 21-Jan-15 12:57)  Authored  Last Updated: 21-Jan-15 12:57 by Manya Silvas (MD)

## 2015-03-09 NOTE — Consult Note (Signed)
I spoke to Dr. Saralyn Pilar and he said it would be ok to stop the heparin which I will do and will also schedule an EGD for tomorrow for his chest pain given his unremarkable cath results today. Will also check [place] of abd as gall bladder disease can mascarade as cardiac disease.  See consult Loren Racer PA.   Electronic Signatures: Manya Silvas (MD) (Signed on 20-Jan-15 16:21)  Authored   Last Updated: 20-Jan-15 17:17 by Manya Silvas (MD)

## 2015-03-09 NOTE — Consult Note (Signed)
PATIENT NAME:  Lawrence [name], MESA MR#:  [id number] DATE OF BIRTH:  06/21/1956  DATE OF CONSULTATION:  05/19/2014  REFERRING PHYSICIAN:   CONSULTING PHYSICIAN:  Dwayne D. Clayborn Bigness, MD  PRIMARY CARE PHYSICIAN:  Dr. [name]   CARDIOLOGIST:  Dr. [name]  (Dictation Anomaly)  Dr. Lavetta Nielsen  INDICATION: Unstable angina.  HISTORY OF PRESENT ILLNESS:  The patient is a 59 year old white male with multiple medical problems including diabetes, hyperlipidemia, COPD, coronary artery disease, status post coronary bypass surgery, PCI and stent in November 2014 to native circumflex. The patient had a catheterization in January for recurrent chest pains and states he was doing reasonably well but started having 9/10 chest pain at rest to the left chest area with shortness of breath, brought to the Emergency Room, given nitroglycerin with some improvement, got up to 3 nitroglycerin, placed on IV nitro drip but has had persistent and recurrent symptoms nonetheless. Now, cardiology consultation was recommended.   REVIEW OF SYSTEMS: Denies blackout spells or syncope. No nausea or vomiting. Denies fever, chills, sweats. No weight loss or weight gain. No hemoptysis or hematemesis. No bright red blood per rectum. No vision change or hearing change. No sputum production or cough.   PAST MEDICAL HISTORY: Hyperlipidemia, diabetes, COPD, GE reflux, coronary artery disease.  PAST SURGICAL HISTORY:  Surgeries include coronary artery bypass surgery, PCI, and stent.   SOCIAL HISTORY: Remote smoking. Married. Disabled. No recent alcohol consumption.   FAMILY HISTORY: Positive for hypertension and COPD.   ALLERGIES: DEMEROL AND SULFA.   MEDICATIONS: He is on aspirin 81 mg a day, Percocet 10/325 every 4-6 hours p.r.n., Imdur 30 mg a day, nitroglycerin sublingual p.r.n., metformin 500 mg daily, sertraline 100 mg at bedtime, atorvastatin 80 mg a day, HCTZ/lisinopril 25/20 mg daily, Plavix 75 mg a day, alprazolam 1 mg 3 times a day,  Coreg 12.5 twice a day, Advair 500/50 one puff twice a day, Spiriva 18 mcg once a day, Xopenex 45 mcg 2 puffs q. 6 hours p.r.n., Lasix 40 mg a day, potassium 10 mEq daily, Lovaza 1000 mg 4 capsules daily, Nexium 40 mg a day.   PHYSICAL EXAMINATION:  VITAL SIGNS: Blood pressure 110/70, pulse of 60, respiratory rate of 18, afebrile.  HEENT: Normocephalic, atraumatic. Pupils equal and reactive to light.  NECK: Supple. No significant JVD, bruits or adenopathy.  LUNGS: Clear to auscultation and percussion. No significant wheezing or rales.  He had some mild rhonchi.  HEART: Slightly distant. Regular rate and rhythm. Systolic ejection murmur at the apex. Otherwise normal.  ABDOMEN: Benign.  EXTREMITIES: Within normal limits.  NEUROLOGIC: Intact.  SKIN: Normal.   RADIOLOGICAL AND LABORATORY DATA:   EKG: Normal sinus rhythm, nonspecific ST-T changes, rate of about 65.  Sodium of 136, potassium of 3.7, chloride of 105, bicarbonate of 29, BUN of 15, creatinine 0.94, glucose 93, magnesium 1.7. LFTs were normal. Troponin less than 0.02. White count 53, hemoglobin of 14.5, platelet count of 156,000.   Chest x-ray: Nonspecific ST-T wave changes.   ASSESSMENT:  1. Unstable angina.  2. Coronary artery disease.  3. Diabetes.  4. Hypertension.  5. Hyperlipidemia.  6. Gastroesophageal reflux disease.  7. Chronic obstructive pulmonary disease.  8. Anxiety.   PLAN:   1. Agree with admit. Rule out for myocardial infarction. Continue current medications. Continue cardiac work-up. May need further evaluation, including invasive studies.  If chest pain continues to require IV nitroglycerin, we will proceed with cardiac catheterization for evaluation of bypass graft as well as recent  PCI and stent. I am hoping to wean his IV nitroglycerin and maybe get a functional study, but if he is not able to get off the IV nitroglycerin and heparin, then a functional study would not be helpful.  2. Continue pain control  with IV nitroglycerin and morphine as necessary.  3. For diabetes, continue metformin and sliding-scale insulin. We will hold metformin pre- catheterization if we proceed in that direction.  4. For gastroesophageal reflux disease, continue Nexium therapy.  5. Chronic obstructive pulmonary disease. Continue inhalers as necessary.  6. Anxiety therapy with Xanax as needed.  7. Again, if the patient has persistent unstable anginal symptoms requiring nitroglycerin, we will proceed with cardiac catheterization in the morning. If we are able to wean him off and discontinue nitroglycerin, we will proceed with a cath.   ____________________________ Loran Senters. Clayborn Bigness, MD ddc:dd D: 05/23/2014 16:45:00 ET T: 05/23/2014 20:06:31 ET JOB#: [id number]  cc: Dwayne D. Clayborn Bigness, MD, <Dictator> Yolonda Kida MD ELECTRONICALLY SIGNED 06/29/2014 12:30

## 2015-03-09 NOTE — Consult Note (Signed)
Brief Consult Note: Diagnosis: [place]/CAD.   Patient was seen by consultant.   Consult note dictated.   Recommend to proceed with surgery or procedure.   Recommend further assessment or treatment.   Orders entered.   Discussed with Attending MD.   Comments: [place] cad htn copd Hx cabg hyperlipidemia dm . PLAN IV NTG IV Heparin ASA Hold metformin in am Continue inhalers cardiac cath in am.  Electronic Signatures: Lujean Amel D (MD)  (Signed 08-Jul-15 10:30)  Authored: Brief Consult Note   Last Updated: 08-Jul-15 10:30 by Lujean Amel D (MD)

## 2015-03-09 NOTE — H&P (Signed)
PATIENT NAME:  Lawrence [name], Lawrence [name] MR#:  [id number] DATE OF BIRTH:  June 12, 1956  DATE OF ADMISSION:  05/22/2014  REFERRING PHYSICIAN: Dr. Joni Fears   PRIMARY CARE PHYSICIAN:  Dr. [name]  CARDIOLOGY:  Dr. [name]  CHIEF COMPLAINT: Chest pains.   HISTORY OF PRESENT ILLNESS: This 59 year old Caucasian gentleman with past medical history of diabetes, hyperlipidemia, COPD, coronary artery disease status post CABG presenting with chest pain. Describes acute onset chest pain, retrosternal location, radiation to the left arm, pressure in quality, 9/10 in intensity, associated with shortness of breath as well as nitroglycerin. In the ER, received nitroglycerin x 3 with minimal relief. Pain decreased from 9 to 7 out of 10, however recurrence of symptoms ER staff initiated heparin drip as well as nitro drip given continuation of symptoms. Still complaining of chest at this time 7 to 8 out of 10.   REVIEW OF SYSTEMS:  CONSTITUTIONAL: Denies fever, chills, fatigue.  EYES: Denied blurred vision, double vision, eye pain.  EARS, NOSE, THROAT: Denies tinnitus, ear pain, hearing loss.  RESPIRATORY: Denies cough, wheeze, current shortness of breath.  CARDIOVASCULAR: Positive for chest pain as I described above. Denies any orthopnea, edema.  GASTROINTESTINAL: Denies nausea, vomiting, diarrhea, abdominal pain.  GENITOURINARY: Denies dysuria, hematuria.  ENDOCRINE: Denies nocturia or thyroid problems.  HEMATOLOGIC AND LYMPHATIC: Denies easy bruising, bleeding.  SKIN: Denies rash or lesion.  MUSCULOSKELETAL: Denies pain in neck, back, shoulders, knees, hips arthritic symptoms.  NEUROLOGIC: Denies paralysis, paresthesia. PSYCHIATRIC: Denies anxiety, depression.   Otherwise, review of systems performed by me is negative.   PAST MEDICAL HISTORY: Hyperlipidemia, diabetes, COPD, non-O2 dependent, gastroesophageal reflux disease, coronary artery disease status post CABG.   SOCIAL HISTORY: Remote tobacco abuse. Denies  any alcohol or drug use.   FAMILY HISTORY: Positive for COPD.   ALLERGIES: DEMEROL AND SULFA.   HOME MEDICATIONS: Aspirin 81 mg p.o. daily, Percocet 10/325 every 4-6 hours as needed for pain, Imdur 30 mg p.o. daily, nitroglycerin 0.4 mg sublingual as needed for chest pain, metformin 500 mg p.o. daily, sertraline 10 mg p.o. at bedtime, atorvastatin 80 mg p.o. at bedtime, hydrochlorothiazide/lisinopril 25/20 mg p.o. daily, Plavix 75 mg p.o. daily, alprazolam 1 mg p.o. 3 times daily as needed for anxiety, Coreg 12.5 mg p.o. b.i.d., Advair 500/50 mcg inhalation 1 puff b.i.d., Spiriva 18 mcg inhalation once daily, Xopenex 45 mcg 2 puffs q. 6 hours as needed for shortness of breath, Lasix 40 mg p.o. daily, potassium 10 mEq p.o. daily, Lovaza 1000 mg 4 capsules daily, Nexium 40 mg p.o. daily.   PHYSICAL EXAMINATION:  VITAL SIGNS: Heart rate 46, blood pressure 107/65, saturating 96% on room air, respirations 15, weight 90.7 kg, BMI of 31.32.  GENERAL: Well-nourished, well-developed Caucasian gentleman, who is currently in minimal distress given continuation of chest pain.  HEAD: Normocephalic, atraumatic.  EYES: Pupils equal, round, reactive to light. Extraocular muscles intact. No scleral icterus.  MOUTH: Moist mucous membranes. Dentition intact. No abscess noted.  EARS, NOSE, AND THROAT: Clear without exudates.  no external lesions NECK: Supple. No thyromegaly. No nodules. No JVD.  PULMONARY: Clear to auscultation bilaterally without wheezes, rales, rhonchi. No accessory muscle use. CHEST: Nontender to palpation.  CARDIOVASCULAR: S1, S2, bradycardic. No murmurs, rubs, or gallops. No edema. Pedal pulses 2+ bilaterally.  GASTROINTESTINAL: Soft, nontender, nondistended. No masses. Positive bowel sounds. No hepatosplenomegaly.  MUSCULOSKELETAL: No swelling, clubbing, or edema. Range of motion full in all extremities.  NEUROLOGIC: Cranial nerves II-XII intact. No gross focal or motor deficits.  Sensation  intact SKIN:  No ulcerations, lesions, rash, cyanosis. Skin warm and dry. Turgor intact.  PSYCHIATRIC: Mood and affect within normal limits. The patient is awake, alert, oriented x 3. Insight and judgment intact.   LABORATORY AND DIAGNOSTIC DATA:  EKG performed, normal sinus rhythm. No ST or T wave abnormalities. Remainder of laboratory data: Sodium 139, potassium 3.7, chloride 105, bicarbonate 29, BUN 15, creatinine 0.94, glucose 93, magnesium 1.7. LFTs within normal limits. Troponin I less than 0.02. WBC 5.3, hemoglobin 14.5, platelets 156,000. Chest x-ray performed revealing no acute cardiopulmonary process.   ASSESSMENT AND PLAN: A 59 year old gentleman with history of coronary artery disease, presenting with chest pain.  1. Unstable angina. Initiate aspirin and statin therapy. Place on telemetry. Trend cardiac enzymes x 3. Initiate on heparin drip as well as nitroglycerin drip in the Emergency Department. We will continue these and titrate for chest pain relief. We will also add the addition of morphine and oxygen. We will consult cardiology. [facility].  2. Diabetes. Hold p.o. agents. Add insulin sliding scale.  3. Hypomagnesemia. Replace to goal of 2.  4. Coronary artery disease, aspirin, statins, beta-blockade, and Plavix as stated above.  5. Chronic obstructive pulmonary disease.  No exacerbation. Continue with Advair, Spiriva.  6. Venous thromboembolism prophylaxis with heparin drip.   CODE STATUS:  The patient is full code.   TIME SPENT: 55 minutes.    ____________________________ Aaron Mose. Rakel Junio, MD dkh:dd D: 05/22/2014 63:78:58 ET T: 05/22/2014 21:37:12 ET JOB#: [id number]  cc: Aaron Mose. Jolyne Laye, MD, <Dictator> Jobeth Pangilinan Woodfin Ganja MD ELECTRONICALLY SIGNED 05/23/2014 3:25

## 2015-03-09 NOTE — Consult Note (Signed)
PATIENT NAME:  Lawrence [name], Lawrence [name] MR#:  [id number] DATE OF BIRTH:  [birth year]  DATE OF CONSULTATION:  12/04/2013  REFERRING PHYSICIAN: Dr. Bobbye Charleston CONSULTING PHYSICIAN:  Isaias Cowman, MD  PRIMARY CARE PHYSICIAN: Dr. [name].  CARDIOLOGIST: Dr. [name].  CHIEF COMPLAINT: Chest pain.   REASON FOR CONSULTATION: Consultation requested for evaluation of chest pain in patient with known coronary artery disease and recent coronary stent.   HISTORY OF PRESENT ILLNESS: The patient is a 59 year old gentleman with known history of coronary artery disease, status post bypass graft surgery. The patient has had some intermittent chest pain and actually underwent recent cardiac catheterization and drug-eluting stent in left circumflex on 10/13/2013. The patient reports that he was doing well until yesterday when he experienced chest pain. The patient took 3 sublingual nitroglycerin without relief. He presented to [facility] Emergency Room. Notable admission labs included a troponin of less than 0.02. Despite multiple nitroglycerins and morphine, the patient only has only had mild relief. EKG was nondiagnostic. The patient reports that he was told that he has multiple other coronary lesions that could account for recurrent angina.   PAST MEDICAL HISTORY:  1.  Status post coronary artery bypass graft surgery in 2002 at [facility].  2.  Status post drug-eluting stent, left circumflex, 10/13/2013.  3.  Hypertension.  4.  Hyperlipidemia.  5.  Diabetes.  6.  Gastroesophageal reflux disease.   MEDICATIONS ON ADMISSION: Xopenex inhaler 2 puffs q.6 hours, Spiriva 18 mcg inhalation daily, Plavix 75 mg daily, Percocet 1 tab q.4 to 6 hours p.r.n., Plavix 75 mg daily, Nexium 40 mg daily, metformin 500 mg daily, Lovaza 1000 mg 4 caps daily, Lasix 40 mg daily, Klor-Con 10 mEq daily, isosorbide mononitrate 1 daily, hydrochlorothiazide 1 daily, carvedilol 12.5 mg b.i.d., cetirizine 10 mg daily, bupropion 150 mg b.i.d., atorvastatin  80 mg daily, aspirin 81 mg daily, alprazolam 1 mg t.i.d. and Advair Diskus 1 puff b.i.d.   SOCIAL HISTORY: The patient is married. He has 2 children. He quit smoking 04/2013.   FAMILY HISTORY: No history of premature coronary artery disease or myocardial infarction.   REVIEW OF SYSTEMS: CONSTITUTIONAL: No fever, chills, weight loss or weight gain.  EYES: Denies vision changes. EARS: Denies hearing loss. RESPIRATORY: Denies shortness of breath. CARDIOVASCULAR: The patient had chest discomfort as described above. GASTROINTESTINAL: The patient has a history of reflux. GENITOURINARY: No dysuria or hematuria. ENDOCRINE: No polyuria or polydipsia. HEMATOLOGICAL: No easy bruising or bleeding. MUSCULOSKELETAL: No arthralgias or myalgias. NEUROLOGICAL: No focal muscle weakness or numbness. PSYCHOLOGICAL: No depression or anxiety.   PHYSICAL EXAMINATION:  VITAL SIGNS: Blood pressure 134/80, pulse 53, temperature 97.5, respirations 19, pulse oximetry 91%.  HEENT: Pupils equal, reactive to light and accommodation.  NECK: Supple without thyromegaly.  LUNGS: Clear.  HEART: Normal JVP. Normal PMI. Regular rate and rhythm. Normal S1, S2. No appreciable gallop, murmur, or rub.  ABDOMEN: Soft and nontender. Pulses were intact bilaterally.  MUSCULOSKELETAL: Normal muscle tone.  NEUROLOGIC: The patient is alert and oriented x 3. Motor and sensory both grossly intact.   IMPRESSION: A 59 year old gentleman with multiple cardiovascular risk factors, coronary artery disease, status post bypass graft surgery, status post recent drug-eluting stent with chest pain and initial negative troponin. Chest pain with typical and atypical features.   RECOMMENDATIONS:  1.  I agree with overall current therapy.  2.  I would defer full dose anticoagulation at this time.  3.  Review 2D echocardiogram.  4.  Review recent cardiac catheterization and PCI. At  this time, functional study with ETT sestamibi study would be of little  value. I would either consider repeat cardiac catheterization or  more aggressive medical therapy pending repeat cardiac enzymes and review of recent sestamibi.   Isaias Cowman, MD ap:aw D: 12/04/2013 08:59:00 ET T: 12/04/2013 11:14:14 ET JOB#: [id number]  cc: Isaias Cowman, MD, <Dictator> Isaias Cowman MD ELECTRONICALLY SIGNED 01/02/2014 12:28

## 2015-03-11 ENCOUNTER — Ambulatory Visit
Admit: 2015-03-11 | Disposition: A | Discharge status: Home / Self Care | Payer: Self-pay | Attending: Gastroenterology | Admitting: Gastroenterology

## 2015-03-12 LAB — SURGICAL PATHOLOGY

## 2015-03-22 ENCOUNTER — Emergency Department
Admission: EM | Admit: 2015-03-22 | Discharge: 2015-03-23 | Disposition: A | Discharge status: Home / Self Care | Payer: Medicare PPO | Attending: Emergency Medicine | Admitting: Emergency Medicine

## 2015-03-22 ENCOUNTER — Other Ambulatory Visit: Payer: Self-pay

## 2015-03-22 DIAGNOSIS — Z79899 Other long term (current) drug therapy: Secondary | ICD-10-CM | POA: Diagnosis not present

## 2015-03-22 DIAGNOSIS — Z7902 Long term (current) use of antithrombotics/antiplatelets: Secondary | ICD-10-CM | POA: Diagnosis not present

## 2015-03-22 DIAGNOSIS — Z7951 Long term (current) use of inhaled steroids: Secondary | ICD-10-CM | POA: Diagnosis not present

## 2015-03-22 DIAGNOSIS — R072 Precordial pain: Secondary | ICD-10-CM | POA: Insufficient documentation

## 2015-03-22 DIAGNOSIS — I10 Essential (primary) hypertension: Secondary | ICD-10-CM | POA: Diagnosis not present

## 2015-03-22 DIAGNOSIS — R079 Chest pain, unspecified: Secondary | ICD-10-CM | POA: Diagnosis present

## 2015-03-22 DIAGNOSIS — Z7982 Long term (current) use of aspirin: Secondary | ICD-10-CM | POA: Insufficient documentation

## 2015-03-22 HISTORY — DX: Chronic obstructive pulmonary disease, unspecified: J44.9

## 2015-03-22 LAB — BASIC METABOLIC PANEL
Anion gap: 7 (ref 5–15)
BUN: 11 mg/dL (ref 6–20)
CO2: 32 mmol/L (ref 22–32)
Calcium: 9.2 mg/dL (ref 8.9–10.3)
Chloride: 104 mmol/L (ref 101–111)
Creatinine, Ser: 0.82 mg/dL (ref 0.61–1.24)
GFR calc Af Amer: >60 mL/min (ref >60)
GFR calc non Af Amer: >60 mL/min (ref >60)
Glucose, Bld: 134 mg/dL — ABNORMAL HIGH (ref 65–99)
Potassium: 3.8 mmol/L (ref 3.5–5.1)
Sodium: 143 mmol/L (ref 135–145)

## 2015-03-22 LAB — CBC
HCT: 49.3 % (ref 40.0–52.0)
Hemoglobin: 16.1 g/dL (ref 13.0–18.0)
MCH: 29.2 pg (ref 26.0–34.0)
MCHC: 32.8 g/dL (ref 32.0–36.0)
MCV: 89.2 fL (ref 80.0–100.0)
Platelets: 180 10*3/uL (ref 150–440)
RBC: 5.52 MIL/uL (ref 4.40–5.90)
RDW: 14.4 % (ref 11.5–14.5)
WBC: 6.3 10*3/uL (ref 3.8–10.6)

## 2015-03-22 LAB — TROPONIN I: Troponin I: <0.03 ng/mL (ref <0.031)

## 2015-03-22 NOTE — ED Notes (Signed)
Pt c/o sudden onset substernal chest pain that radiates into the left arm with SOB and nausea since about 1230pm today..states he was walking when the pain started..states he has a hx of MI, bipass surgery.

## 2015-03-23 DIAGNOSIS — R072 Precordial pain: Secondary | ICD-10-CM | POA: Diagnosis not present

## 2015-03-23 LAB — TROPONIN I: Troponin I: <0.03 ng/mL (ref <0.031)

## 2015-03-23 MED ORDER — ASPIRIN 81 MG PO CHEW
324.0000 mg | CHEWABLE_TABLET | Freq: Once | ORAL | Status: AC
  Administered 2015-03-23: 324 mg via ORAL

## 2015-03-23 MED ORDER — NITROGLYCERIN 0.4 MG SL SUBL
0.4000 mg | SUBLINGUAL_TABLET | SUBLINGUAL | Status: DC | PRN
Start: 2015-03-23 — End: 2015-03-23

## 2015-03-23 MED ORDER — NITROGLYCERIN 0.4 MG SL SUBL
SUBLINGUAL_TABLET | SUBLINGUAL | Status: AC
  Administered 2015-03-23: 0.4 mg
  Filled 2015-03-23: qty 1

## 2015-03-23 MED ORDER — ASPIRIN 81 MG PO CHEW: 324.0000 mg | CHEWABLE_TABLET | Freq: Once | ORAL | Status: DC

## 2015-03-23 MED ORDER — ASPIRIN 81 MG PO CHEW
CHEWABLE_TABLET | ORAL | Status: AC
  Filled 2015-03-23: qty 4

## 2015-03-23 NOTE — ED Provider Notes (Signed)
[facility] Emergency Department Provider Note    ____________________________________________  Time seen: 11:55 PM  I have reviewed the triage vital signs and the nursing notes.   HISTORY  Chief Complaint Chest Pain       HPI Lawrence [name] is a 59 y.o. male who states that he has been having chest pain since 11:00 this morning, this is described as pressure which radiates out of the left side of his chest. Patient describes that he has had this type of chest pain many times in the past, that he has had stents placed for similar chest pains, and that he has taken aspirin 1 baby and nitroglycerin one set 11:00 today which did not provide any relief.  Patient is described as sternal, duration since 11 AM, timing sudden onset, severity is described as severe quality is described as pressure, context is having a history of coronary disease and previous stenting, he does take Plavix as well daily. There is no associated nausea or vomiting. There is no satiated abdominal pain. There is no associated headache or neck pain.    Past Medical History  Diagnosis Date  . Hypertension   . Coronary artery disease   . COPD (chronic obstructive pulmonary disease)   . Chronic bronchitis     There are no active problems to display for this patient.   Past Surgical History  Procedure Laterality Date  . Vascular surgery    . Bypass graft    . Coronary angioplasty with stent placement    . Tonsillectomy    . Cholecystectomy    . Esophageal dilation      Current Outpatient Rx  Name  Route  Sig  Dispense  Refill  . ALPRAZolam (XANAX) 1 MG tablet   Oral   Take 3 mg by mouth at bedtime.         Marland Kitchen aspirin EC 81 MG tablet   Oral   Take 81 mg by mouth every morning.         Marland Kitchen atorvastatin (LIPITOR) 80 MG tablet   Oral   Take 80 mg by mouth at bedtime.         Marland Kitchen buPROPion (WELLBUTRIN SR) 150 MG 12 hr tablet   Oral   Take 150 mg by mouth 2 (two) times  daily.         . carvedilol (COREG) 12.5 MG tablet   Oral   Take 12.5 mg by mouth 2 (two) times daily.         . cetirizine (ZYRTEC) 10 MG tablet   Oral   Take 10 mg by mouth every morning.         . clopidogrel (PLAVIX) 75 MG tablet   Oral   Take 75 mg by mouth every morning.         Marland Kitchen esomeprazole (NEXIUM) 40 MG capsule   Oral   Take 40 mg by mouth every morning.         . Fluticasone-Salmeterol (ADVAIR) 500-50 MCG/DOSE AEPB   Inhalation   Inhale 1 puff into the lungs 2 (two) times daily.         . furosemide (LASIX) 40 MG tablet   Oral   Take 40 mg by mouth every morning.         . hyoscyamine (LEVSIN SL) 0.125 MG SL tablet   Sublingual   Place 0.125 mg under the tongue every 6 (six) hours as needed for cramping.         Marland Kitchen  isosorbide mononitrate (IMDUR) 30 MG 24 hr tablet   Oral   Take 30 mg by mouth every morning.         . lansoprazole (PREVACID) 30 MG capsule   Oral   Take 30 mg by mouth every morning.         . levalbuterol (XOPENEX HFA) 45 MCG/ACT inhaler   Inhalation   Inhale 2 puffs into the lungs every 6 (six) hours as needed for shortness of breath.         . lisinopril-hydrochlorothiazide (PRINZIDE,ZESTORETIC) 20-12.5 MG per tablet   Oral   Take 1 tablet by mouth every morning.         . metFORMIN (GLUCOPHAGE) 500 MG tablet   Oral   Take 500 mg by mouth every morning.         Marland Kitchen omega-3 acid ethyl esters (LOVAZA) 1 G capsule   Oral   Take 4 g by mouth every morning.         Marland Kitchen oxyCODONE-acetaminophen (PERCOCET) 10-325 MG per tablet   Oral   Take 1 tablet by mouth every 4 (four) hours as needed for pain.         . potassium chloride (K-DUR,KLOR-CON) 10 MEQ tablet   Oral   Take 10 mEq by mouth every morning.         . ranitidine (ZANTAC) 150 MG tablet   Oral   Take 300 mg by mouth 2 (two) times daily.         Marland Kitchen tiotropium (SPIRIVA) 18 MCG inhalation capsule   Inhalation   Place 18 mcg into inhaler and  inhale daily.           Allergies Demerol and Sulfa antibiotics  No family history on file.  Social History History  Substance Use Topics  . Smoking status: Not on file  . Smokeless tobacco: Not on file  . Alcohol Use: Not on file    Review of Systems  Constitutional: Negative for fever. Eyes: Negative for visual changes. ENT: Negative for sore throat. Cardiovascular: See history of present illness Respiratory: Negative for shortness of breath. Gastrointestinal: Negative for abdominal pain, vomiting and diarrhea. Genitourinary: Negative for dysuria. Musculoskeletal: Negative for back pain. Skin: Negative for rash. Neurological: Negative for headaches, focal weakness or numbness.   10-point ROS otherwise negative.  ____________________________________________   PHYSICAL EXAM:  VITAL SIGNS: ED Triage Vitals  Enc Vitals Group     BP 03/22/15 1639 175/92 mmHg     Pulse Rate 03/22/15 1639 65     Resp 03/22/15 1639 20     Temp 03/22/15 1639 97.6 F (36.4 C)     Temp Source 03/22/15 1639 Oral     SpO2 03/22/15 1639 98 %     Weight 03/22/15 1639 209 lb (94.802 kg)     Height 03/22/15 1639 5' 7"  (1.702 m)     Head Cir --      Peak Flow --      Pain Score 03/22/15 1640 9     Pain Loc --      Pain Edu? --      Excl. in [place]? --      Constitutional: Alert and oriented. Well appearing and in no distress. Eyes: Conjunctivae are normal. PERRL. Normal extraocular movements. ENT   Head: Normocephalic and atraumatic.   Nose: No congestion/rhinnorhea.   Mouth/Throat: Mucous membranes are moist.   Neck: No stridor. Hematological/Lymphatic/Immunilogical: No cervical lymphadenopathy. Cardiovascular: Normal rate, regular rhythm. Normal and symmetric  distal pulses are present in all extremities. No murmurs, rubs, or gallops. Respiratory: Normal respiratory effort without tachypnea nor retractions. Breath sounds are clear and equal bilaterally. No  wheezes/rales/rhonchi. Gastrointestinal: Soft and nontender. No distention. No abdominal bruits. There is no CVA tenderness. Genitourinary:  Musculoskeletal: Nontender with normal range of motion in all extremities. No joint effusions.  No lower extremity tenderness nor edema. Neurologic:  Normal speech and language. No gross focal neurologic deficits are appreciated. Speech is normal. No gait instability. Skin:  Skin is warm, dry and intact. No rash noted. Psychiatric: Mood and affect are normal. Speech and behavior are normal. Patient exhibits appropriate insight and judgment.  Patient has normal cardiorespiratory examination. No abdominal pain. ____________________________________________    LABS (pertinent positives/negatives)  Labs Reviewed  BASIC METABOLIC PANEL - Abnormal; Notable for the following:    Glucose, Bld 134 (*)    All other components within normal limits  CBC  TROPONIN I  TROPONIN I   Labs essentially unremarkable, his troponin is normal 2. Of note the second troponin is drawn 12 hours after his symptoms started.   ____________________________________________   EKG   Date: 03/23/2015  Rate: 65  Rhythm: normal sinus rhythm  QRS Axis: normal  Intervals: normal  ST/T Wave abnormalities: normal  Conduction Disutrbances: none  Narrative Interpretation: unremarkable   ____________________________________________   PROCEDURES  Procedure(s) performed: None  Critical Care performed: No  ____________________________________________   INITIAL IMPRESSION / ASSESSMENT AND PLAN / ED COURSE  Pertinent labs & imaging results that were available during my care of the patient were reviewed by me and considered in my medical decision making (see chart for details).  Patient presents with chest pain and pressure, which is concerning for acute coronary syndrome given his explanation of symptoms. However, review of patient's chart and previous visits notes that  he has been seen many times for similar chest pain in the emergency department. Patient hasn't has 2 negative troponins, a very normal EKG, he has normal cardiorespiratory examination. Clear lungs normal oxygen saturation.  Given the patient's previous visit history, being seen multiple times for similar symptoms, I have called and discussed with her total cardiology physician on call  Cardiology (d/w Dr. [name]) recommends the patient be discharged home and to call the clinic and set up follow-up appointment for early this week. Discussed with the patient plan of care and return precautions. Dr. [name] will also have clinic call patient Monday to setup follow-up.  Patient is agreeable with the plan, and will return should his chest pain significantly worsen, if he becomes short of breath, lightheaded develops nausea sweats or vomiting. Her other new concerns or symptoms arise.  ____________________________________________   FINAL CLINICAL IMPRESSION(S) / ED DIAGNOSES  Final diagnoses:  Precordial pain     Delman Kitten, MD 03/23/15 [phone]

## 2015-03-23 NOTE — Discharge Instructions (Signed)
Chest Pain Observation  Your evaluation, electrocardiogram, and lab work are reassuring at this point that you're not having an acute heart attack. However, this cannot be ruled out with 196% certainty. It is important that she follow-up with Dr. [name]. Please call the cardiology clinic on Monday to set up follow-up for early this week.  Return to the ER right away after chest pain suddenly worsens, difficulty breathing, developed nausea, vomiting, abdominal pain, fever, or other new concerns and symptoms arise  It is often hard to give a specific diagnosis for the cause of chest pain. Among other possibilities your symptoms might be caused by inadequate oxygen delivery to your heart (angina). Angina that is not treated or evaluated can lead to a heart attack (myocardial infarction) or death. Blood tests, electrocardiograms, and X-rays may have been done to help determine a possible cause of your chest pain. After evaluation and observation, your health care provider has determined that it is unlikely your pain was caused by an unstable condition that requires hospitalization. However, a full evaluation of your pain may need to be completed, with additional diagnostic testing as directed. It is very important to keep your follow-up appointments. Not keeping your follow-up appointments could result in permanent heart damage, disability, or death. If there is any problem keeping your follow-up appointments, you must call your health care provider. HOME CARE INSTRUCTIONS  Due to the slight chance that your pain could be angina, it is important to follow your health care provider's treatment plan and also maintain a healthy lifestyle:  Maintain or work toward achieving a healthy weight.  Stay physically active and exercise regularly.  Decrease your salt intake.  Eat a balanced, healthy diet. Talk to a dietitian to learn about heart-healthy foods.  Increase your fiber intake by including whole  grains, vegetables, fruits, and nuts in your diet.  Avoid situations that cause stress, anger, or depression.  Take medicines as advised by your health care provider. Report any side effects to your health care provider. Do not stop medicines or adjust the dosages on your own.  Quit smoking. Do not use nicotine patches or gum until you check with your health care provider.  Keep your blood pressure, blood sugar, and cholesterol levels within normal limits.  Limit alcohol intake to no more than 1 drink per day for women who are not pregnant and 2 drinks per day for men.  Do not abuse drugs. SEEK IMMEDIATE MEDICAL CARE IF: You have severe chest pain or pressure which may include symptoms such as:  You feel pain or pressure in your arms, neck, jaw, or back.  You have severe back or abdominal pain, feel sick to your stomach (nauseous), or throw up (vomit).  You are sweating profusely.  You are having a fast or irregular heartbeat.  You feel short of breath while at rest.  You notice increasing shortness of breath during rest, sleep, or with activity.  You have chest pain that does not get better after rest or after taking your usual medicine.  You wake from sleep with chest pain.  You are unable to sleep because you cannot breathe.  You develop a frequent cough or you are coughing up blood.  You feel dizzy, faint, or experience extreme fatigue.  You develop severe weakness, dizziness, fainting, or chills. Any of these symptoms may represent a serious problem that is an emergency. Do not wait to see if the symptoms will go away. Call your local emergency services (911 in  the [place] Do not drive yourself to the hospital. MAKE SURE YOU:  Understand these instructions.  Will watch your condition.  Will get help right away if you are not doing well or get worse. Document Released: 12/05/2010 Document Revised: 11/07/2013 Document Reviewed: 05/04/2013 [facility] Patient  Information 2015 [place], [place]. This information is not intended to replace advice given to you by your health care provider. Make sure you discuss any questions you have with your health care provider.

## 2015-04-04 LAB — HEMOGLOBIN A1C: Hgb A1c MFr Bld: 5.9 % (ref 4.0–6.0)

## 2015-04-22 ENCOUNTER — Other Ambulatory Visit: Payer: Self-pay | Admitting: Family Medicine

## 2015-04-25 ENCOUNTER — Other Ambulatory Visit: Payer: Self-pay | Admitting: Family Medicine

## 2015-04-25 MED ORDER — METFORMIN HCL 500 MG PO TABS: 500.0000 mg | ORAL_TABLET | Freq: Every morning | ORAL | Status: DC

## 2015-04-25 MED ORDER — NITROGLYCERIN 0.4 MG SL SUBL: 0.4000 mg | SUBLINGUAL_TABLET | SUBLINGUAL | Status: DC | PRN

## 2015-04-25 MED ORDER — ATORVASTATIN CALCIUM 80 MG PO TABS: 80.0000 mg | ORAL_TABLET | Freq: Every day | ORAL | Status: DC

## 2015-04-26 ENCOUNTER — Other Ambulatory Visit: Payer: Self-pay | Admitting: *Deleted

## 2015-04-26 MED ORDER — NITROGLYCERIN 0.4 MG SL SUBL: 0.4000 mg | SUBLINGUAL_TABLET | SUBLINGUAL | Status: DC | PRN

## 2015-04-26 NOTE — Telephone Encounter (Signed)
-----   Message from Birdie Sons, MD sent at 04/25/2015 12:43 PM EDT ----- Regarding: Zostavax Received request from [facility] for Zostavax vaccine. May call in order to pharmacy for Zostavax 0.65 ml SQ x 1. Thanks.

## 2015-04-26 NOTE — Telephone Encounter (Signed)
[facility] to order Zostervax and they stated that they do not give the injections there. Spoke to pt who stated that he will until his ov to have vaccine done here. Patient also requested refill for his nitroglycerin.

## 2015-05-02 ENCOUNTER — Other Ambulatory Visit: Payer: Self-pay | Admitting: Family Medicine

## 2015-05-02 NOTE — Telephone Encounter (Signed)
Patient is requesting his Oxycodone

## 2015-05-03 MED ORDER — OXYCODONE-ACETAMINOPHEN 10-325 MG PO TABS: 1.0000 | ORAL_TABLET | ORAL | Status: DC | PRN

## 2015-05-03 NOTE — Telephone Encounter (Signed)
Pt called to see if RX for Oxycodone 10-325 mg was ready and would like a call when it is ready. Pt's last OV was 02/04/15 and last RX was 04/04/15. Thanks TNP

## 2015-05-05 ENCOUNTER — Encounter: Payer: Self-pay | Admitting: *Deleted

## 2015-05-05 ENCOUNTER — Emergency Department: Payer: Medicare PPO

## 2015-05-05 ENCOUNTER — Other Ambulatory Visit: Payer: Self-pay

## 2015-05-05 ENCOUNTER — Emergency Department
Admission: EM | Admit: 2015-05-05 | Discharge: 2015-05-05 | Disposition: A | Discharge status: Home / Self Care | Payer: Medicare PPO | Attending: Emergency Medicine | Admitting: Emergency Medicine

## 2015-05-05 DIAGNOSIS — Z87891 Personal history of nicotine dependence: Secondary | ICD-10-CM | POA: Diagnosis not present

## 2015-05-05 DIAGNOSIS — I10 Essential (primary) hypertension: Secondary | ICD-10-CM | POA: Insufficient documentation

## 2015-05-05 DIAGNOSIS — R109 Unspecified abdominal pain: Secondary | ICD-10-CM | POA: Diagnosis present

## 2015-05-05 DIAGNOSIS — R079 Chest pain, unspecified: Secondary | ICD-10-CM | POA: Insufficient documentation

## 2015-05-05 DIAGNOSIS — K5900 Constipation, unspecified: Secondary | ICD-10-CM | POA: Diagnosis not present

## 2015-05-05 DIAGNOSIS — I2510 Atherosclerotic heart disease of native coronary artery without angina pectoris: Secondary | ICD-10-CM | POA: Insufficient documentation

## 2015-05-05 LAB — BASIC METABOLIC PANEL
Anion gap: 3 — ABNORMAL LOW (ref 5–15)
BUN: 14 mg/dL (ref 6–20)
CO2: 28 mmol/L (ref 22–32)
Calcium: 8.7 mg/dL — ABNORMAL LOW (ref 8.9–10.3)
Chloride: 109 mmol/L (ref 101–111)
Creatinine, Ser: 1 mg/dL (ref 0.61–1.24)
GFR calc Af Amer: >60 mL/min (ref >60)
GFR calc non Af Amer: >60 mL/min (ref >60)
Glucose, Bld: 203 mg/dL — ABNORMAL HIGH (ref 65–99)
Potassium: 3.7 mmol/L (ref 3.5–5.1)
Sodium: 140 mmol/L (ref 135–145)

## 2015-05-05 LAB — CBC WITH DIFFERENTIAL/PLATELET
Basophils Absolute: 0 10*3/uL (ref 0–0.1)
Basophils Relative: 1 %
Eosinophils Absolute: 0.2 10*3/uL (ref 0–0.7)
Eosinophils Relative: 4 %
HCT: 47.2 % (ref 40.0–52.0)
Hemoglobin: 15.2 g/dL (ref 13.0–18.0)
Lymphocytes Relative: 23 %
Lymphs Abs: 1.4 10*3/uL (ref 1.0–3.6)
MCH: 28.9 pg (ref 26.0–34.0)
MCHC: 32.2 g/dL (ref 32.0–36.0)
MCV: 89.6 fL (ref 80.0–100.0)
Monocytes Absolute: 0.4 10*3/uL (ref 0.2–1.0)
Monocytes Relative: 7 %
Neutro Abs: 4 10*3/uL (ref 1.4–6.5)
Neutrophils Relative %: 65 %
Platelets: 150 10*3/uL (ref 150–440)
RBC: 5.27 MIL/uL (ref 4.40–5.90)
RDW: 14.6 % — ABNORMAL HIGH (ref 11.5–14.5)
WBC: 6 10*3/uL (ref 3.8–10.6)

## 2015-05-05 LAB — URINALYSIS COMPLETE WITH MICROSCOPIC (ARMC ONLY)
Bacteria, UA: NONE SEEN
Bilirubin Urine: NEGATIVE
Glucose, UA: NEGATIVE mg/dL
Hgb urine dipstick: NEGATIVE
Ketones, ur: NEGATIVE mg/dL
Leukocytes, UA: NEGATIVE
Nitrite: NEGATIVE
Protein, ur: NEGATIVE mg/dL
Specific Gravity, Urine: 1.024 (ref 1.005–1.030)
pH: 5 (ref 5.0–8.0)

## 2015-05-05 LAB — LIPASE, BLOOD: Lipase: 36 U/L (ref 22–51)

## 2015-05-05 LAB — TROPONIN I: Troponin I: <0.03 ng/mL (ref <0.031)

## 2015-05-05 MED ORDER — SODIUM CHLORIDE 0.9 % IV SOLN
Freq: Once | INTRAVENOUS | Status: AC
  Administered 2015-05-05: 19:00:00 via INTRAVENOUS

## 2015-05-05 MED ORDER — HYDROMORPHONE HCL 1 MG/ML IJ SOLN
INTRAMUSCULAR | Status: AC
  Administered 2015-05-05: 1 mg via INTRAVENOUS
  Filled 2015-05-05: qty 1

## 2015-05-05 MED ORDER — POLYETHYLENE GLYCOL 3350 17 G PO PACK: 17.0000 g | PACK | Freq: Every day | ORAL | Status: DC

## 2015-05-05 MED ORDER — HYDROMORPHONE HCL 1 MG/ML IJ SOLN
1.0000 mg | Freq: Once | INTRAMUSCULAR | Status: AC
  Administered 2015-05-05: 1 mg via INTRAVENOUS

## 2015-05-05 MED ORDER — ONDANSETRON HCL 4 MG/2ML IJ SOLN
4.0000 mg | Freq: Once | INTRAMUSCULAR | Status: AC
  Administered 2015-05-05: 4 mg via INTRAVENOUS

## 2015-05-05 MED ORDER — HYDROMORPHONE HCL 1 MG/ML IJ SOLN
INTRAMUSCULAR | Status: AC
  Filled 2015-05-05: qty 1

## 2015-05-05 MED ORDER — ONDANSETRON HCL 4 MG/2ML IJ SOLN
INTRAMUSCULAR | Status: AC
  Filled 2015-05-05: qty 2

## 2015-05-05 NOTE — ED Notes (Signed)
Returned from radiology. 

## 2015-05-05 NOTE — ED Provider Notes (Signed)
[facility] Emergency Department Provider Note     Time seen: ----------------------------------------- 6:55 PM on 05/05/2015 -----------------------------------------    I have reviewed the triage vital signs and the nursing notes.   HISTORY  Chief Complaint Abdominal Pain    HPI Lawrence [name] is a 59 y.o. male who presents ER for general is abdominal pain with nausea for the last 3 days. Patient states his history of pancreatitis but it is unknown etiology. Patient states she does not drink, has had his gallbladder removed. Patient also placed chest pain with exertion. Pain is moderate to severe, generalized. Nothing makes it better or worse. It has been worsening to 3 days. Currently severe, dull.   Past Medical History  Diagnosis Date  . Hypertension   . Coronary artery disease   . COPD (chronic obstructive pulmonary disease)   . Chronic bronchitis   . Emphysema lung     There are no active problems to display for this patient.   Past Surgical History  Procedure Laterality Date  . Vascular surgery    . Bypass graft    . Coronary angioplasty with stent placement    . Tonsillectomy    . Cholecystectomy    . Esophageal dilation      Allergies Demerol and Sulfa antibiotics  Social History History  Substance Use Topics  . Smoking status: Former Research scientist (life sciences)  . Smokeless tobacco: Not on file  . Alcohol Use: Not on file    Review of Systems Constitutional: Negative for fever. Eyes: Negative for visual changes. ENT: Negative for sore throat. Cardiovascular: Positive for chest pain Respiratory: Negative for shortness of breath. Gastrointestinal: Positive for abdominal pain and nausea Genitourinary: Negative for dysuria. Musculoskeletal: Negative for back pain. Skin: Negative for rash. Neurological: Negative for headaches, focal weakness or numbness.  10-point ROS otherwise  negative.  ____________________________________________   PHYSICAL EXAM:  VITAL SIGNS: ED Triage Vitals  Enc Vitals Group     BP 05/05/15 1742 144/91 mmHg     Pulse Rate 05/05/15 1742 69     Resp 05/05/15 1742 22     Temp 05/05/15 1742 97.8 F (36.6 C)     Temp Source 05/05/15 1742 Oral     SpO2 05/05/15 1742 100 %     Weight 05/05/15 1742 221 lb (100.245 kg)     Height 05/05/15 1742 5' 7"  (1.702 m)     Head Cir --      Peak Flow --      Pain Score 05/05/15 1743 10     Pain Loc --      Pain Edu? --      Excl. in [place]? --     Constitutional: Alert and oriented. Well appearing and in no distress. Eyes: Conjunctivae are normal. PERRL. Normal extraocular movements. ENT   Head: Normocephalic and atraumatic.   Nose: No congestion/rhinnorhea.   Mouth/Throat: Mucous membranes are moist.   Neck: No stridor. Hematological/Lymphatic/Immunilogical: No cervical lymphadenopathy. Cardiovascular: Normal rate, regular rhythm. Normal and symmetric distal pulses are present in all extremities. No murmurs, rubs, or gallops. Respiratory: Normal respiratory effort without tachypnea nor retractions. Breath sounds are clear and equal bilaterally. No wheezes/rales/rhonchi. Gastrointestinal: Distended, hypoactive bowel sounds. Nonfocal tenderness. Musculoskeletal: Nontender with normal range of motion in all extremities. No joint effusions.  No lower extremity tenderness nor edema. Neurologic:  Normal speech and language. No gross focal neurologic deficits are appreciated. Speech is normal. No gait instability. Skin:  Skin is warm, dry and intact. No  rash noted. Psychiatric: Mood and affect are normal. Speech and behavior are normal. Patient exhibits appropriate insight and judgment.  EKG: Interpreted by me, normal sinus rhythm, with normal axis normal intervals, no evidence of hypertrophy or acute infarction. There is nonspecific ST and T-wave  changes.  ____________________________________________  ED COURSE:  Pertinent labs & imaging results that were available during my care of the patient were reviewed by me and considered in my medical decision making (see chart for details). We'll check basic labs, IV a medication and reevaluate. ____________________________________________    LABS (pertinent positives/negatives)  Labs Reviewed  BASIC METABOLIC PANEL - Abnormal; Notable for the following:    Glucose, Bld 203 (*)    Calcium 8.7 (*)    Anion gap 3 (*)    All other components within normal limits  CBC WITH DIFFERENTIAL/PLATELET - Abnormal; Notable for the following:    RDW 14.6 (*)    All other components within normal limits  URINALYSIS COMPLETEWITH MICROSCOPIC ([facility] ONLY) - Abnormal; Notable for the following:    Color, Urine YELLOW (*)    APPearance CLEAR (*)    Squamous Epithelial / LPF 0-5 (*)    All other components within normal limits  TROPONIN I  LIPASE, BLOOD    RADIOLOGY  IMPRESSION: Diffuse stool throughout colon. No obstruction or free air.  ____________________________________________  FINAL ASSESSMENT AND PLAN  Abdominal pain  Plan: Patient with diffuse abdominal pain without specific etiology. Labs this point been unremarkable. He does appear constipated, will be on MiraLAX and encouraged close all up with his primary care doctor for reevaluation.   Earleen Newport, MD   Earleen Newport, MD 05/05/15 2011

## 2015-05-05 NOTE — Discharge Instructions (Signed)
Abdominal Pain Many things can cause abdominal pain. Usually, abdominal pain is not caused by a disease and will improve without treatment. It can often be observed and treated at home. Your health care provider will do a physical exam and possibly order blood tests and X-rays to help determine the seriousness of your pain. However, in many cases, more time must pass before a clear cause of the pain can be found. Before that point, your health care provider may not know if you need more testing or further treatment. HOME CARE INSTRUCTIONS  Monitor your abdominal pain for any changes. The following actions may help to alleviate any discomfort you are experiencing:  Only take over-the-counter or prescription medicines as directed by your health care provider.  Do not take laxatives unless directed to do so by your health care provider.  Try a clear liquid diet (broth, tea, or water) as directed by your health care provider. Slowly move to a bland diet as tolerated. SEEK MEDICAL CARE IF:  You have unexplained abdominal pain.  You have abdominal pain associated with nausea or diarrhea.  You have pain when you urinate or have a bowel movement.  You experience abdominal pain that wakes you in the night.  You have abdominal pain that is worsened or improved by eating food.  You have abdominal pain that is worsened with eating fatty foods.  You have a fever. SEEK IMMEDIATE MEDICAL CARE IF:   Your pain does not go away within 2 hours.  You keep throwing up (vomiting).  Your pain is felt only in portions of the abdomen, such as the right side or the left lower portion of the abdomen.  You pass bloody or black tarry stools. MAKE SURE YOU:  Understand these instructions.   Will watch your condition.   Will get help right away if you are not doing well or get worse.  Document Released: 08/12/2005 Document Revised: 11/07/2013 Document Reviewed: 07/12/2013 [facility] Patient Information  2015 [place], [place]. This information is not intended to replace advice given to you by your health care provider. Make sure you discuss any questions you have with your health care provider.  Constipation Constipation is when a person has fewer than three bowel movements a week, has difficulty having a bowel movement, or has stools that are dry, hard, or larger than normal. As people grow older, constipation is more common. If you try to fix constipation with medicines that make you have a bowel movement (laxatives), the problem may get worse. Long-term laxative use may cause the muscles of the colon to become weak. A low-fiber diet, not taking in enough fluids, and taking certain medicines may make constipation worse.  CAUSES   Certain medicines, such as antidepressants, pain medicine, iron supplements, antacids, and water pills.   Certain diseases, such as diabetes, irritable bowel syndrome (IBS), thyroid disease, or depression.   Not drinking enough water.   Not eating enough fiber-rich foods.   Stress or travel.   Lack of physical activity or exercise.   Ignoring the urge to have a bowel movement.   Using laxatives too much.  SIGNS AND SYMPTOMS   Having fewer than three bowel movements a week.   Straining to have a bowel movement.   Having stools that are hard, dry, or larger than normal.   Feeling full or bloated.   Pain in the lower abdomen.   Not feeling relief after having a bowel movement.  DIAGNOSIS  Your health care provider will take  a medical history and perform a physical exam. Further testing may be done for severe constipation. Some tests may include:  A barium enema X-ray to examine your rectum, colon, and, sometimes, your small intestine.   A sigmoidoscopy to examine your lower colon.   A colonoscopy to examine your entire colon. TREATMENT  Treatment will depend on the severity of your constipation and what is causing it. Some dietary  treatments include drinking more fluids and eating more fiber-rich foods. Lifestyle treatments may include regular exercise. If these diet and lifestyle recommendations do not help, your health care provider may recommend taking over-the-counter laxative medicines to help you have bowel movements. Prescription medicines may be prescribed if over-the-counter medicines do not work.  HOME CARE INSTRUCTIONS   Eat foods that have a lot of fiber, such as fruits, vegetables, whole grains, and beans.  Limit foods high in fat and processed sugars, such as french fries, hamburgers, cookies, candies, and soda.   A fiber supplement may be added to your diet if you cannot get enough fiber from foods.   Drink enough fluids to keep your urine clear or pale yellow.   Exercise regularly or as directed by your health care provider.   Go to the restroom when you have the urge to go. Do not hold it.   Only take over-the-counter or prescription medicines as directed by your health care provider. Do not take other medicines for constipation without talking to your health care provider first.  [place] IF:   You have bright red blood in your stool.   Your constipation lasts for more than 4 days or gets worse.   You have abdominal or rectal pain.   You have thin, pencil-like stools.   You have unexplained weight loss. MAKE SURE YOU:   Understand these instructions.  Will watch your condition.  Will get help right away if you are not doing well or get worse. Document Released: 07/31/2004 Document Revised: 11/07/2013 Document Reviewed: 08/14/2013 [facility] Patient Information 2015 [place], [place]. This information is not intended to replace advice given to you by your health care provider. Make sure you discuss any questions you have with your health care provider.

## 2015-05-05 NOTE — ED Notes (Addendum)
Pt here for abdominal pain which has "been getting worse for a couple of days".  Pt with distended abdomen and chronic medical problems including fatty liver.  Pt also reports chest pain with exertion

## 2015-05-05 NOTE — ED Notes (Signed)
Patient to ED with c.o generalized abdominal pain and nausea. Patient states he has a history of pancreatitis. Patient states abdominal pain is sharp and radiates to the back. Patient states that abdomen has had increased distension over " the past several days". Patient also complains of chest pain with exertion. Patient has a history of previous MI. Patient is alert and oriented. Patient is able to speak in complete sentences without difficulty. Patient can follow instructions. Last bowel movement on Friday. Patient states she has been having difficulty with bowel movements. Patient denies any diarrhea.

## 2015-05-08 ENCOUNTER — Emergency Department: Payer: Medicare PPO

## 2015-05-08 ENCOUNTER — Other Ambulatory Visit: Payer: Self-pay

## 2015-05-08 ENCOUNTER — Emergency Department
Admission: EM | Admit: 2015-05-08 | Discharge: 2015-05-09 | Disposition: A | Discharge status: Home / Self Care | Payer: Medicare PPO | Attending: Student | Admitting: Student

## 2015-05-08 DIAGNOSIS — Z7902 Long term (current) use of antithrombotics/antiplatelets: Secondary | ICD-10-CM | POA: Diagnosis not present

## 2015-05-08 DIAGNOSIS — K5900 Constipation, unspecified: Secondary | ICD-10-CM | POA: Insufficient documentation

## 2015-05-08 DIAGNOSIS — I10 Essential (primary) hypertension: Secondary | ICD-10-CM | POA: Diagnosis not present

## 2015-05-08 DIAGNOSIS — R109 Unspecified abdominal pain: Secondary | ICD-10-CM | POA: Diagnosis present

## 2015-05-08 DIAGNOSIS — Z79899 Other long term (current) drug therapy: Secondary | ICD-10-CM | POA: Insufficient documentation

## 2015-05-08 DIAGNOSIS — Z87891 Personal history of nicotine dependence: Secondary | ICD-10-CM | POA: Diagnosis not present

## 2015-05-08 LAB — CBC WITH DIFFERENTIAL/PLATELET
Basophils Absolute: 0 10*3/uL (ref 0–0.1)
Basophils Relative: 1 %
Eosinophils Absolute: 0.3 10*3/uL (ref 0–0.7)
Eosinophils Relative: 4 %
HCT: 50 % (ref 40.0–52.0)
Hemoglobin: 15.9 g/dL (ref 13.0–18.0)
Lymphocytes Relative: 26 %
Lymphs Abs: 1.7 10*3/uL (ref 1.0–3.6)
MCH: 28.5 pg (ref 26.0–34.0)
MCHC: 31.7 g/dL — ABNORMAL LOW (ref 32.0–36.0)
MCV: 89.9 fL (ref 80.0–100.0)
Monocytes Absolute: 0.4 10*3/uL (ref 0.2–1.0)
Monocytes Relative: 7 %
Neutro Abs: 4.1 10*3/uL (ref 1.4–6.5)
Neutrophils Relative %: 62 %
Platelets: 170 10*3/uL (ref 150–440)
RBC: 5.56 MIL/uL (ref 4.40–5.90)
RDW: 14.2 % (ref 11.5–14.5)
WBC: 6.5 10*3/uL (ref 3.8–10.6)

## 2015-05-08 LAB — COMPREHENSIVE METABOLIC PANEL
ALT: 19 U/L (ref 17–63)
AST: 23 U/L (ref 15–41)
Albumin: 4 g/dL (ref 3.5–5.0)
Alkaline Phosphatase: 63 U/L (ref 38–126)
Anion gap: 7 (ref 5–15)
BUN: 9 mg/dL (ref 6–20)
CO2: 31 mmol/L (ref 22–32)
Calcium: 9.5 mg/dL (ref 8.9–10.3)
Chloride: 102 mmol/L (ref 101–111)
Creatinine, Ser: 0.95 mg/dL (ref 0.61–1.24)
GFR calc Af Amer: >60 mL/min (ref >60)
GFR calc non Af Amer: >60 mL/min (ref >60)
Glucose, Bld: 105 mg/dL — ABNORMAL HIGH (ref 65–99)
Potassium: 4.4 mmol/L (ref 3.5–5.1)
Sodium: 140 mmol/L (ref 135–145)
Total Bilirubin: 1.3 mg/dL — ABNORMAL HIGH (ref 0.3–1.2)
Total Protein: 6.9 g/dL (ref 6.5–8.1)

## 2015-05-08 LAB — URINALYSIS COMPLETE WITH MICROSCOPIC (ARMC ONLY)
Bacteria, UA: NONE SEEN
Bilirubin Urine: NEGATIVE
Glucose, UA: NEGATIVE mg/dL
Hgb urine dipstick: NEGATIVE
Ketones, ur: NEGATIVE mg/dL
Leukocytes, UA: NEGATIVE
Nitrite: NEGATIVE
Protein, ur: NEGATIVE mg/dL
RBC / HPF: NONE SEEN RBC/hpf (ref 0–5)
Specific Gravity, Urine: 1.002 — ABNORMAL LOW (ref 1.005–1.030)
Squamous Epithelial / LPF: NONE SEEN
pH: 6 (ref 5.0–8.0)

## 2015-05-08 LAB — LIPASE, BLOOD: Lipase: 25 U/L (ref 22–51)

## 2015-05-08 MED ORDER — MAGNESIUM CITRATE PO SOLN: 1.0000 | Freq: Once | ORAL | Status: DC

## 2015-05-08 NOTE — ED Notes (Signed)
Pt in with co abd pain x 3 weeks, was seen here Friday for the same and given laxatives for constipation.  Pt still having abd pain, last BM was Friday.

## 2015-05-08 NOTE — ED Provider Notes (Signed)
[facility] Emergency Department Provider Note  ____________________________________________  Time seen: Approximately 10:26 PM  I have reviewed the triage vital signs and the nursing notes.   HISTORY  Chief Complaint Abdominal Pain    HPI Lawrence [name] is a 59 y.o. male history of hypertension, coronary artery disease status post stent and CABG, COPD who presents for evaluation of 3 weeks constant diffuse abdominal pain, gradual onset. Patient reports that he has had this pain multiple times in the past. He reports that sometimes the pain is caused by his pancreas/related to pancreatitis. He is followed by GI for achalasia, undergoing workup for possible celiac disease. He was seen at [facility] on 05/02/2015 for similar pain had a negative CT angio of the abdomen and pelvis at that time. He was seen here in this emergency department on 05/05/2015 with negative labs, diagnosed with constipation and started on MiraLAX. He reports that he has had continued pain, has been unable to have a bowel movement for 5 days. He continues to pass flatus. No vomiting but he has been nauseated. Current severity is moderate. No modifying factors. No chest pain or difficulty breathing.   Past Medical History  Diagnosis Date  . Hypertension   . Coronary artery disease   . COPD (chronic obstructive pulmonary disease)   . Chronic bronchitis   . Emphysema lung     There are no active problems to display for this patient.   Past Surgical History  Procedure Laterality Date  . Vascular surgery    . Bypass graft    . Coronary angioplasty with stent placement    . Tonsillectomy    . Cholecystectomy    . Esophageal dilation      Current Outpatient Rx  Name  Route  Sig  Dispense  Refill  . ALPRAZolam (XANAX) 1 MG tablet   Oral   Take 3 mg by mouth at bedtime.         Marland Kitchen aspirin EC 81 MG tablet   Oral   Take 81 mg by mouth every morning.         Marland Kitchen atorvastatin (LIPITOR) 80 MG  tablet   Oral   Take 1 tablet (80 mg total) by mouth at bedtime.   90 tablet   3   . buPROPion (WELLBUTRIN SR) 150 MG 12 hr tablet   Oral   Take 150 mg by mouth 2 (two) times daily.         . carvedilol (COREG) 12.5 MG tablet   Oral   Take 12.5 mg by mouth 2 (two) times daily.         . cetirizine (ZYRTEC) 10 MG tablet   Oral   Take 10 mg by mouth every morning.         . clopidogrel (PLAVIX) 75 MG tablet      TAKE ONE (1) TABLET EACH DAY   30 tablet   5   . esomeprazole (NEXIUM) 40 MG capsule   Oral   Take 40 mg by mouth every morning.         . Fluticasone-Salmeterol (ADVAIR) 500-50 MCG/DOSE AEPB   Inhalation   Inhale 1 puff into the lungs 2 (two) times daily.         . furosemide (LASIX) 40 MG tablet   Oral   Take 40 mg by mouth every morning.         . hyoscyamine (LEVSIN SL) 0.125 MG SL tablet   Sublingual   Place  0.125 mg under the tongue every 6 (six) hours as needed for cramping.         . isosorbide mononitrate (IMDUR) 30 MG 24 hr tablet   Oral   Take 30 mg by mouth every morning.         . lansoprazole (PREVACID) 30 MG capsule   Oral   Take 30 mg by mouth every morning.         . levalbuterol (XOPENEX HFA) 45 MCG/ACT inhaler   Inhalation   Inhale 2 puffs into the lungs every 6 (six) hours as needed for shortness of breath.         . lisinopril-hydrochlorothiazide (PRINZIDE,ZESTORETIC) 20-12.5 MG per tablet   Oral   Take 1 tablet by mouth every morning.         . metFORMIN (GLUCOPHAGE) 500 MG tablet   Oral   Take 1 tablet (500 mg total) by mouth every morning.   90 tablet   4   . nitroGLYCERIN (NITROSTAT) 0.4 MG SL tablet   Sublingual   Place 1 tablet (0.4 mg total) under the tongue every 5 (five) minutes as needed for chest pain. Every 5 minutes as needed for chest pain   30 tablet   1   . omega-3 acid ethyl esters (LOVAZA) 1 G capsule   Oral   Take 4 g by mouth every morning.         Marland Kitchen oxyCODONE-acetaminophen  (PERCOCET) 10-325 MG per tablet   Oral   Take 1 tablet by mouth every 4 (four) hours as needed for pain.   150 tablet   0   . polyethylene glycol (MIRALAX / GLYCOLAX) packet   Oral   Take 17 g by mouth daily.   14 each   0   . potassium chloride (K-DUR,KLOR-CON) 10 MEQ tablet   Oral   Take 10 mEq by mouth every morning.         . ranitidine (ZANTAC) 150 MG tablet   Oral   Take 300 mg by mouth 2 (two) times daily.         Marland Kitchen tiotropium (SPIRIVA) 18 MCG inhalation capsule   Inhalation   Place 18 mcg into inhaler and inhale daily.           Allergies Demerol and Sulfa antibiotics  No family history on file.  Social History History  Substance Use Topics  . Smoking status: Former Research scientist (life sciences)  . Smokeless tobacco: Not on file  . Alcohol Use: Not on file    Review of Systems Constitutional: No fever/chills Eyes: No visual changes. ENT: No sore throat. Cardiovascular: Denies chest pain. Respiratory: Denies shortness of breath. Gastrointestinal: + abdominal pain.  + nausea, no vomiting.  No diarrhea.  + constipation. Genitourinary: Negative for dysuria. Musculoskeletal: Negative for back pain. Skin: Negative for rash. Neurological: Negative for headaches, focal weakness or numbness.  10-point ROS otherwise negative.  ____________________________________________   PHYSICAL EXAM:  VITAL SIGNS: ED Triage Vitals  Enc Vitals Group     BP 05/08/15 2038 182/91 mmHg     Pulse Rate 05/08/15 2038 68     Resp 05/08/15 2038 18     Temp 05/08/15 2038 97.7 F (36.5 C)     Temp Source 05/08/15 2038 Oral     SpO2 05/08/15 2038 95 %     Weight 05/08/15 2038 221 lb (100.245 kg)     Height 05/08/15 2038 5' 7"  (1.702 m)     Head Cir --  Peak Flow --      Pain Score 05/08/15 2040 10     Pain Loc --      Pain Edu? --      Excl. in [place]? --     Constitutional: Alert and oriented. Well appearing and in no acute distress. Eyes: Conjunctivae are normal. PERRL.  EOMI. Head: Atraumatic. Nose: No congestion/rhinnorhea. Mouth/Throat: Mucous membranes are moist.  Oropharynx non-erythematous. Neck: No stridor.   Cardiovascular: Normal rate, regular rhythm. Grossly normal heart sounds.  Good peripheral circulation. Respiratory: Normal respiratory effort.  No retractions. Lungs CTAB. Gastrointestinal: Soft, no tenderness to palpation with distraction. Mild distention. Normal bowel sounds. No abdominal bruits. No CVA tenderness. Genitourinary: Deferred Musculoskeletal: No lower extremity tenderness nor edema.  No joint effusions. Neurologic:  Normal speech and language. No gross focal neurologic deficits are appreciated. Speech is normal. No gait instability. Skin:  Skin is warm, dry and intact. No rash noted. Psychiatric: Mood and affect are normal. Speech and behavior are normal.  ____________________________________________   LABS (all labs ordered are listed, but only abnormal results are displayed)  Labs Reviewed  CBC WITH DIFFERENTIAL/PLATELET - Abnormal; Notable for the following:    MCHC 31.7 (*)    All other components within normal limits  COMPREHENSIVE METABOLIC PANEL - Abnormal; Notable for the following:    Glucose, Bld 105 (*)    Total Bilirubin 1.3 (*)    All other components within normal limits  URINALYSIS COMPLETEWITH MICROSCOPIC ([facility] ONLY) - Abnormal; Notable for the following:    Color, Urine STRAW (*)    APPearance CLEAR (*)    Specific Gravity, Urine 1.002 (*)    All other components within normal limits  LIPASE, BLOOD   ____________________________________________  EKG  ED ECG REPORT I, Joanne Gavel, the attending physician, personally viewed and interpreted this ECG.   Date: 05/08/2015  EKG Time: 20:46  Rate: 62  Rhythm: normal EKG, normal sinus rhythm  Axis: Normal  Intervals:none  ST&T Change: No acute ST segment elevation  ____________________________________________  RADIOLOGY  3 view Abdominal  Xray IMPRESSION: 1. Unremarkable bowel gas pattern; no free intra-abdominal air seen. Stool burden has decreased from the prior study, with a small to moderate amount of stool still seen in the colon. 2. Vascular congestion noted. Peribronchial thickening seen. Mild bibasilar atelectasis noted. ____________________________________________   PROCEDURES  Procedure(s) performed: None  Critical Care performed: No  ____________________________________________   INITIAL IMPRESSION / ASSESSMENT AND PLAN / ED COURSE  Pertinent labs & imaging results that were available during my care of the patient were reviewed by me and considered in my medical decision making (see chart for details).  Lawrence [name] is a 59 y.o. male history of hypertension, coronary artery disease status post stent and CABG, COPD who presents for evaluation of 3 weeks constant diffuse abdominal pain, gradual onset. On exam, he is nontoxic appearing. Normal bowel sounds, mild distention, no tenderness with distraction. X-rays notable for continued although decreased stool burden. No obstruction or perforation. We'll discharge with magnesium citrate. He had a CT of his abdomen and pelvis just 5 days ago, no indication for repeat CT today. Labs generally unremarkable. He transiently desaturated to 88% while sleeping but when he awakens and takes a deep breath, O2 sat is 96%. He has no new oxygen requirement. Return processes discussed. DC home. ____________________________________________   FINAL CLINICAL IMPRESSION(S) / ED DIAGNOSES  Final diagnoses:  Abdominal pain, unspecified abdominal location  Constipation, unspecified constipation type  Joanne Gavel, MD 05/08/15 [id number]

## 2015-05-10 ENCOUNTER — Emergency Department
Admission: EM | Admit: 2015-05-10 | Discharge: 2015-05-11 | Disposition: A | Discharge status: Home / Self Care | Payer: Medicare PPO | Attending: Emergency Medicine | Admitting: Emergency Medicine

## 2015-05-10 ENCOUNTER — Other Ambulatory Visit: Payer: Self-pay

## 2015-05-10 ENCOUNTER — Emergency Department: Payer: Medicare PPO

## 2015-05-10 ENCOUNTER — Encounter: Payer: Self-pay | Admitting: Emergency Medicine

## 2015-05-10 DIAGNOSIS — K5900 Constipation, unspecified: Secondary | ICD-10-CM | POA: Diagnosis not present

## 2015-05-10 DIAGNOSIS — R1084 Generalized abdominal pain: Secondary | ICD-10-CM | POA: Diagnosis present

## 2015-05-10 DIAGNOSIS — Z7951 Long term (current) use of inhaled steroids: Secondary | ICD-10-CM | POA: Diagnosis not present

## 2015-05-10 DIAGNOSIS — Z7982 Long term (current) use of aspirin: Secondary | ICD-10-CM | POA: Diagnosis not present

## 2015-05-10 DIAGNOSIS — Z7902 Long term (current) use of antithrombotics/antiplatelets: Secondary | ICD-10-CM | POA: Diagnosis not present

## 2015-05-10 DIAGNOSIS — Z79899 Other long term (current) drug therapy: Secondary | ICD-10-CM | POA: Diagnosis not present

## 2015-05-10 DIAGNOSIS — Z87891 Personal history of nicotine dependence: Secondary | ICD-10-CM | POA: Diagnosis not present

## 2015-05-10 DIAGNOSIS — I10 Essential (primary) hypertension: Secondary | ICD-10-CM | POA: Diagnosis not present

## 2015-05-10 LAB — COMPREHENSIVE METABOLIC PANEL
ALT: 18 U/L (ref 17–63)
AST: 21 U/L (ref 15–41)
Albumin: 4.1 g/dL (ref 3.5–5.0)
Alkaline Phosphatase: 66 U/L (ref 38–126)
Anion gap: 7 (ref 5–15)
BUN: 7 mg/dL (ref 6–20)
CO2: 32 mmol/L (ref 22–32)
Calcium: 9.5 mg/dL (ref 8.9–10.3)
Chloride: 104 mmol/L (ref 101–111)
Creatinine, Ser: 0.97 mg/dL (ref 0.61–1.24)
GFR calc Af Amer: >60 mL/min (ref >60)
GFR calc non Af Amer: >60 mL/min (ref >60)
Glucose, Bld: 98 mg/dL (ref 65–99)
Potassium: 4.4 mmol/L (ref 3.5–5.1)
Sodium: 143 mmol/L (ref 135–145)
Total Bilirubin: 0.7 mg/dL (ref 0.3–1.2)
Total Protein: 7.4 g/dL (ref 6.5–8.1)

## 2015-05-10 LAB — CBC WITH DIFFERENTIAL/PLATELET
Basophils Absolute: 0 10*3/uL (ref 0–0.1)
Basophils Relative: 1 %
Eosinophils Absolute: 0.3 10*3/uL (ref 0–0.7)
Eosinophils Relative: 5 %
HCT: 49.9 % (ref 40.0–52.0)
Hemoglobin: 16.2 g/dL (ref 13.0–18.0)
Lymphocytes Relative: 27 %
Lymphs Abs: 1.5 10*3/uL (ref 1.0–3.6)
MCH: 29.1 pg (ref 26.0–34.0)
MCHC: 32.5 g/dL (ref 32.0–36.0)
MCV: 89.6 fL (ref 80.0–100.0)
Monocytes Absolute: 0.5 10*3/uL (ref 0.2–1.0)
Monocytes Relative: 9 %
Neutro Abs: 3.4 10*3/uL (ref 1.4–6.5)
Neutrophils Relative %: 58 %
Platelets: 175 10*3/uL (ref 150–440)
RBC: 5.57 MIL/uL (ref 4.40–5.90)
RDW: 14.6 % — ABNORMAL HIGH (ref 11.5–14.5)
WBC: 5.8 10*3/uL (ref 3.8–10.6)

## 2015-05-10 LAB — URINALYSIS COMPLETE WITH MICROSCOPIC (ARMC ONLY)
Bacteria, UA: NONE SEEN
Bilirubin Urine: NEGATIVE
Glucose, UA: NEGATIVE mg/dL
Hgb urine dipstick: NEGATIVE
Ketones, ur: NEGATIVE mg/dL
Leukocytes, UA: NEGATIVE
Nitrite: NEGATIVE
Protein, ur: NEGATIVE mg/dL
Specific Gravity, Urine: 1.006 (ref 1.005–1.030)
pH: 6 (ref 5.0–8.0)

## 2015-05-10 LAB — TROPONIN I: Troponin I: <0.03 ng/mL (ref <0.031)

## 2015-05-10 LAB — LIPASE, BLOOD: Lipase: 30 U/L (ref 22–51)

## 2015-05-10 NOTE — ED Notes (Signed)
Patient with complaint of generalized abd cramping. Patient states that he was seen here Wednesday for the same and was told that he is constipated. Patient states that he took mag citrate and miralax with no results. Patient states that his last BM was last Friday.

## 2015-05-10 NOTE — ED Notes (Signed)
Patient transported to X-ray 

## 2015-05-11 DIAGNOSIS — K5900 Constipation, unspecified: Secondary | ICD-10-CM | POA: Diagnosis not present

## 2015-05-11 MED ORDER — ONDANSETRON 4 MG PO TBDP
4.0000 mg | ORAL_TABLET | Freq: Once | ORAL | Status: AC
  Administered 2015-05-11: 4 mg via ORAL

## 2015-05-11 MED ORDER — LACTULOSE 10 GM/15ML PO SOLN
30.0000 g | Freq: Once | ORAL | Status: AC
  Administered 2015-05-11: 30 g via ORAL

## 2015-05-11 MED ORDER — LACTULOSE 10 GM/15ML PO SOLN: 20.0000 g | Freq: Every day | ORAL | Status: DC | PRN

## 2015-05-11 MED ORDER — ONDANSETRON 4 MG PO TBDP
ORAL_TABLET | ORAL | Status: AC
  Filled 2015-05-11: qty 1

## 2015-05-11 MED ORDER — LACTULOSE 10 GM/15ML PO SOLN
ORAL | Status: AC
  Filled 2015-05-11: qty 60

## 2015-05-11 MED ORDER — DIAZEPAM 2 MG PO TABS: 2.0000 mg | ORAL_TABLET | Freq: Once | ORAL | Status: DC

## 2015-05-11 NOTE — ED Provider Notes (Signed)
[facility] Emergency Department Provider Note  ____________________________________________  Time seen: Approximately 12:06 AM  I have reviewed the triage vital signs and the nursing notes.   HISTORY  Chief Complaint Abdominal Pain    HPI Lawrence [name] is a 59 y.o. male who presents for constipation associated with generalized abdominal cramping. Patient has had several recent visits to the ED for similar complaints. Initially he was placed on MiraLAX without good effect; and subsequently placed on mag citrate which he states has not initiated a bowel movement. Patient states his last good bowel movement was one week ago. Patient complains of nausea without vomiting. Denies fever, chills, chest pain, shortness of breath, headache, weakness, numbness, tingling. Takes narcotic pain medicines for chronic pain; states he has been decreasing his narcotic use to alleviate his constipation.   Past Medical History  Diagnosis Date  . Hypertension   . Coronary artery disease   . COPD (chronic obstructive pulmonary disease)   . Chronic bronchitis   . Emphysema lung     There are no active problems to display for this patient.   Past Surgical History  Procedure Laterality Date  . Vascular surgery    . Bypass graft    . Coronary angioplasty with stent placement    . Tonsillectomy    . Cholecystectomy    . Esophageal dilation      Current Outpatient Rx  Name  Route  Sig  Dispense  Refill  . ALPRAZolam (XANAX) 1 MG tablet   Oral   Take 1 mg by mouth 3 (three) times daily.          Marland Kitchen aspirin EC 81 MG tablet   Oral   Take 81 mg by mouth every morning.         Marland Kitchen atorvastatin (LIPITOR) 80 MG tablet   Oral   Take 1 tablet (80 mg total) by mouth at bedtime.   90 tablet   3   . carvedilol (COREG) 12.5 MG tablet   Oral   Take 12.5 mg by mouth 2 (two) times daily.         . cetirizine (ZYRTEC) 10 MG tablet   Oral   Take 10 mg by mouth every  morning.         . citalopram (CELEXA) 20 MG tablet   Oral   Take 20 mg by mouth daily.         . clopidogrel (PLAVIX) 75 MG tablet      TAKE ONE (1) TABLET EACH DAY   30 tablet   5   . Fluticasone-Salmeterol (ADVAIR) 500-50 MCG/DOSE AEPB   Inhalation   Inhale 1 puff into the lungs 2 (two) times daily.         . furosemide (LASIX) 40 MG tablet   Oral   Take 40 mg by mouth every morning.         . hyoscyamine (LEVSIN SL) 0.125 MG SL tablet   Sublingual   Place 0.125 mg under the tongue every 6 (six) hours.          . isosorbide mononitrate (IMDUR) 30 MG 24 hr tablet   Oral   Take 30 mg by mouth every morning.         . lansoprazole (PREVACID) 30 MG capsule   Oral   Take 30 mg by mouth every morning.         . levalbuterol (XOPENEX HFA) 45 MCG/ACT inhaler   Inhalation   Inhale 1-2  puffs into the lungs every 4 (four) hours as needed for shortness of breath.          . lisinopril (PRINIVIL,ZESTRIL) 20 MG tablet   Oral   Take 20 mg by mouth daily.         . magnesium citrate SOLN   Oral   Take 296 mLs (1 Bottle total) by mouth once.   195 mL   0   . metFORMIN (GLUCOPHAGE) 500 MG tablet   Oral   Take 1 tablet (500 mg total) by mouth every morning.   90 tablet   4   . nitroGLYCERIN (NITROSTAT) 0.4 MG SL tablet   Sublingual   Place 1 tablet (0.4 mg total) under the tongue every 5 (five) minutes as needed for chest pain. Every 5 minutes as needed for chest pain   30 tablet   1   . omega-3 acid ethyl esters (LOVAZA) 1 G capsule   Oral   Take 4 g by mouth every morning.         Marland Kitchen oxyCODONE-acetaminophen (PERCOCET) 10-325 MG per tablet   Oral   Take 1 tablet by mouth every 4 (four) hours as needed for pain.   150 tablet   0   . polyethylene glycol (MIRALAX / GLYCOLAX) packet   Oral   Take 17 g by mouth daily.   14 each   0   . potassium chloride (K-DUR,KLOR-CON) 10 MEQ tablet   Oral   Take 10 mEq by mouth every morning.         .  tamsulosin (FLOMAX) 0.4 MG CAPS capsule   Oral   Take 0.4 mg by mouth daily.         Marland Kitchen tiotropium (SPIRIVA) 18 MCG inhalation capsule   Inhalation   Place 18 mcg into inhaler and inhale daily.         Marland Kitchen buPROPion (WELLBUTRIN SR) 150 MG 12 hr tablet   Oral   Take 150 mg by mouth 2 (two) times daily.         Marland Kitchen esomeprazole (NEXIUM) 40 MG capsule   Oral   Take 40 mg by mouth every morning.         Marland Kitchen lisinopril-hydrochlorothiazide (PRINZIDE,ZESTORETIC) 20-12.5 MG per tablet   Oral   Take 1 tablet by mouth every morning.         . ranitidine (ZANTAC) 150 MG tablet   Oral   Take 300 mg by mouth 2 (two) times daily.           Allergies Demerol and Sulfa antibiotics  History reviewed. No pertinent family history.  Social History History  Substance Use Topics  . Smoking status: Former Research scientist (life sciences)  . Smokeless tobacco: Not on file  . Alcohol Use: No    Review of Systems Constitutional: No fever/chills Eyes: No visual changes. ENT: No sore throat. Cardiovascular: Denies chest pain. Respiratory: Denies shortness of breath. Gastrointestinal: Positive for abdominal pain.  Positive for nausea, no vomiting.  No diarrhea.  Positive for constipation. Genitourinary: Negative for dysuria. Musculoskeletal: Negative for back pain. Skin: Negative for rash. Neurological: Negative for headaches, focal weakness or numbness.  10-point ROS otherwise negative.  ____________________________________________   PHYSICAL EXAM:  VITAL SIGNS: ED Triage Vitals  Enc Vitals Group     BP 05/10/15 1945 159/94 mmHg     Pulse Rate 05/10/15 1945 70     Resp 05/10/15 1945 18     Temp 05/10/15 1945 97.5 F (36.4 C)  Temp Source 05/10/15 1945 Oral     SpO2 05/10/15 1945 92 %     Weight 05/10/15 1945 221 lb (100.245 kg)     Height 05/10/15 1945 5' 7"  (1.702 m)     Head Cir --      Peak Flow --      Pain Score 05/10/15 1946 10     Pain Loc --      Pain Edu? --      Excl. in [place]? --      Constitutional: Alert and oriented. Well appearing and in no acute distress. Eyes: Conjunctivae are normal. PERRL. EOMI. Head: Atraumatic. Nose: No congestion/rhinnorhea. Mouth/Throat: Mucous membranes are moist.  Oropharynx non-erythematous. Neck: No stridor.   Cardiovascular: Normal rate, regular rhythm. Grossly normal heart sounds.  Good peripheral circulation. Respiratory: Normal respiratory effort.  No retractions. Lungs CTAB. Gastrointestinal: Soft, mildly diffusely tender to palpation without rebound or guarding. Decreased bowel sounds. Mild distention. No abdominal bruits. No CVA tenderness. Rectal: No fecal impaction. Musculoskeletal: No lower extremity tenderness nor edema.  No joint effusions. Neurologic:  Normal speech and language. No gross focal neurologic deficits are appreciated. Speech is normal. No gait instability. Skin:  Skin is warm, dry and intact. No rash noted. Psychiatric: Mood and affect are normal. Speech and behavior are normal.  ____________________________________________   LABS (all labs ordered are listed, but only abnormal results are displayed)  Labs Reviewed  CBC WITH DIFFERENTIAL/PLATELET - Abnormal; Notable for the following:    RDW 14.6 (*)    All other components within normal limits  URINALYSIS COMPLETEWITH MICROSCOPIC ([facility] ONLY) - Abnormal; Notable for the following:    Color, Urine STRAW (*)    APPearance CLEAR (*)    Squamous Epithelial / LPF 0-5 (*)    All other components within normal limits  COMPREHENSIVE METABOLIC PANEL  LIPASE, BLOOD  TROPONIN I   ____________________________________________  EKG  ED ECG REPORT I, Marlowe Lawes J, the attending physician, personally viewed and interpreted this ECG.   Date: 05/11/2015  EKG Time: 1949  Rate: 67  Rhythm: normal EKG, normal sinus rhythm  Axis: Normal  Intervals:none  ST&T Change: Nonspecific  ____________________________________________  RADIOLOGY  Abdomen 3 way  with chest (viewed by me, interpreted per Dr. Dorann Lodge): Mild chronic bronchitic changes without acute cardiopulmonary process.  Moderate amount of retained large bowel stool, similar without bowel obstruction.  ____________________________________________   PROCEDURES  Procedure(s) performed: ------------------------------------------------------------------------------------------------------------------- Fecal Disimpaction Procedure Note:  Performed by me:  Patient placed in the left lateral recumbent position with knees drawn towards chest. No fecal impaction palpated. Digital stimulation performed with slight amount of stool extracted.   ------------------------------------------------------------------------------------------------------------------    Critical Care performed: No  ____________________________________________   INITIAL IMPRESSION / ASSESSMENT AND PLAN / ED COURSE  Pertinent labs & imaging results that were available during my care of the patient were reviewed by me and considered in my medical decision making (see chart for details).  59 year old male who returns to the ED for a chief complain of constipation, abdominal discomfort and nausea without vomiting. X-rays reveal moderate amount of retained large bowel stool without obstruction. Discussed with patient and will proceed with PO lactulose and soap suds enema.  ----------------------------------------- 1:54 AM on 05/11/2015 -----------------------------------------  Patient had 2 small bowel movements after the enema. Will discharge patient home on lactulose. Strict return precautions given. Patient verbalizes understanding and agrees with plan of care. ____________________________________________   FINAL CLINICAL IMPRESSION(S) / ED DIAGNOSES  Final diagnoses:  Generalized abdominal  pain  Constipation, unspecified constipation type      Paulette Blanch, MD 05/11/15 [phone]

## 2015-05-11 NOTE — ED Notes (Signed)
Pt drove self and has no driver home per pt. Dr. Beather Arbour notified regarding driver and valium order.

## 2015-05-11 NOTE — Discharge Instructions (Signed)
1. Take laxitive as needed for bowel movements (lactulose). 2. Drink plenty of fluids daily. 3. Return to the ER for worsening symptoms, persistent vomiting, fever or other concerns.  Constipation Constipation is when a person has fewer than three bowel movements a week, has difficulty having a bowel movement, or has stools that are dry, hard, or larger than normal. As people grow older, constipation is more common. If you try to fix constipation with medicines that make you have a bowel movement (laxatives), the problem may get worse. Long-term laxative use may cause the muscles of the colon to become weak. A low-fiber diet, not taking in enough fluids, and taking certain medicines may make constipation worse.  CAUSES   Certain medicines, such as antidepressants, pain medicine, iron supplements, antacids, and water pills.   Certain diseases, such as diabetes, irritable bowel syndrome (IBS), thyroid disease, or depression.   Not drinking enough water.   Not eating enough fiber-rich foods.   Stress or travel.   Lack of physical activity or exercise.   Ignoring the urge to have a bowel movement.   Using laxatives too much.  SIGNS AND SYMPTOMS   Having fewer than three bowel movements a week.   Straining to have a bowel movement.   Having stools that are hard, dry, or larger than normal.   Feeling full or bloated.   Pain in the lower abdomen.   Not feeling relief after having a bowel movement.  DIAGNOSIS  Your health care provider will take a medical history and perform a physical exam. Further testing may be done for severe constipation. Some tests may include:  A barium enema X-ray to examine your rectum, colon, and, sometimes, your small intestine.   A sigmoidoscopy to examine your lower colon.   A colonoscopy to examine your entire colon. TREATMENT  Treatment will depend on the severity of your constipation and what is causing it. Some dietary treatments  include drinking more fluids and eating more fiber-rich foods. Lifestyle treatments may include regular exercise. If these diet and lifestyle recommendations do not help, your health care provider may recommend taking over-the-counter laxative medicines to help you have bowel movements. Prescription medicines may be prescribed if over-the-counter medicines do not work.  HOME CARE INSTRUCTIONS   Eat foods that have a lot of fiber, such as fruits, vegetables, whole grains, and beans.  Limit foods high in fat and processed sugars, such as french fries, hamburgers, cookies, candies, and soda.   A fiber supplement may be added to your diet if you cannot get enough fiber from foods.   Drink enough fluids to keep your urine clear or pale yellow.   Exercise regularly or as directed by your health care provider.   Go to the restroom when you have the urge to go. Do not hold it.   Only take over-the-counter or prescription medicines as directed by your health care provider. Do not take other medicines for constipation without talking to your health care provider first.  [place] IF:   You have bright red blood in your stool.   Your constipation lasts for more than 4 days or gets worse.   You have abdominal or rectal pain.   You have thin, pencil-like stools.   You have unexplained weight loss. MAKE SURE YOU:   Understand these instructions.  Will watch your condition.  Will get help right away if you are not doing well or get worse. Document Released: 07/31/2004 Document Revised: 11/07/2013 Document  Reviewed: 08/14/2013 [employer] Patient Information 2015 [place], [place]. This information is not intended to replace advice given to you by your health care provider. Make sure you discuss any questions you have with your health care provider.  Abdominal Pain Many things can cause abdominal pain. Usually, abdominal pain is not caused by a disease and will improve  without treatment. It can often be observed and treated at home. Your health care provider will do a physical exam and possibly order blood tests and X-rays to help determine the seriousness of your pain. However, in many cases, more time must pass before a clear cause of the pain can be found. Before that point, your health care provider may not know if you need more testing or further treatment. HOME CARE INSTRUCTIONS  Monitor your abdominal pain for any changes. The following actions may help to alleviate any discomfort you are experiencing:  Only take over-the-counter or prescription medicines as directed by your health care provider.  Do not take laxatives unless directed to do so by your health care provider.  Try a clear liquid diet (broth, tea, or water) as directed by your health care provider. Slowly move to a bland diet as tolerated. SEEK MEDICAL CARE IF:  You have unexplained abdominal pain.  You have abdominal pain associated with nausea or diarrhea.  You have pain when you urinate or have a bowel movement.  You experience abdominal pain that wakes you in the night.  You have abdominal pain that is worsened or improved by eating food.  You have abdominal pain that is worsened with eating fatty foods.  You have a fever. SEEK IMMEDIATE MEDICAL CARE IF:   Your pain does not go away within 2 hours.  You keep throwing up (vomiting).  Your pain is felt only in portions of the abdomen, such as the right side or the left lower portion of the abdomen.  You pass bloody or black tarry stools. MAKE SURE YOU:  Understand these instructions.   Will watch your condition.   Will get help right away if you are not doing well or get worse.  Document Released: 08/12/2005 Document Revised: 11/07/2013 Document Reviewed: 07/12/2013 [facility] Patient Information 2015 [place], [place]. This information is not intended to replace advice given to you by your health care provider. Make  sure you discuss any questions you have with your health care provider.

## 2015-05-11 NOTE — ED Notes (Signed)
Pt with "two small balls of poop" per pt.

## 2015-05-17 ENCOUNTER — Other Ambulatory Visit: Payer: Self-pay

## 2015-05-17 ENCOUNTER — Emergency Department
Admission: EM | Admit: 2015-05-17 | Discharge: 2015-05-17 | Disposition: A | Discharge status: Home / Self Care | Payer: Medicare PPO | Attending: Emergency Medicine | Admitting: Emergency Medicine

## 2015-05-17 ENCOUNTER — Encounter: Payer: Self-pay | Admitting: Emergency Medicine

## 2015-05-17 ENCOUNTER — Emergency Department: Payer: Medicare PPO

## 2015-05-17 DIAGNOSIS — Z79899 Other long term (current) drug therapy: Secondary | ICD-10-CM | POA: Insufficient documentation

## 2015-05-17 DIAGNOSIS — Z7951 Long term (current) use of inhaled steroids: Secondary | ICD-10-CM | POA: Diagnosis not present

## 2015-05-17 DIAGNOSIS — Z7982 Long term (current) use of aspirin: Secondary | ICD-10-CM | POA: Insufficient documentation

## 2015-05-17 DIAGNOSIS — Z87891 Personal history of nicotine dependence: Secondary | ICD-10-CM | POA: Diagnosis not present

## 2015-05-17 DIAGNOSIS — R1084 Generalized abdominal pain: Secondary | ICD-10-CM | POA: Diagnosis not present

## 2015-05-17 DIAGNOSIS — E119 Type 2 diabetes mellitus without complications: Secondary | ICD-10-CM | POA: Insufficient documentation

## 2015-05-17 DIAGNOSIS — R109 Unspecified abdominal pain: Secondary | ICD-10-CM | POA: Diagnosis present

## 2015-05-17 DIAGNOSIS — I10 Essential (primary) hypertension: Secondary | ICD-10-CM | POA: Insufficient documentation

## 2015-05-17 DIAGNOSIS — R110 Nausea: Secondary | ICD-10-CM | POA: Diagnosis not present

## 2015-05-17 DIAGNOSIS — J159 Unspecified bacterial pneumonia: Secondary | ICD-10-CM | POA: Insufficient documentation

## 2015-05-17 DIAGNOSIS — J189 Pneumonia, unspecified organism: Secondary | ICD-10-CM

## 2015-05-17 HISTORY — DX: Unspecified asthma, uncomplicated: J45.909

## 2015-05-17 LAB — COMPREHENSIVE METABOLIC PANEL
ALT: 19 U/L (ref 17–63)
AST: 24 U/L (ref 15–41)
Albumin: 3.9 g/dL (ref 3.5–5.0)
Alkaline Phosphatase: 56 U/L (ref 38–126)
Anion gap: 7 (ref 5–15)
BUN: 11 mg/dL (ref 6–20)
CO2: 29 mmol/L (ref 22–32)
Calcium: 9 mg/dL (ref 8.9–10.3)
Chloride: 103 mmol/L (ref 101–111)
Creatinine, Ser: 1.17 mg/dL (ref 0.61–1.24)
GFR calc Af Amer: >60 mL/min (ref >60)
GFR calc non Af Amer: >60 mL/min (ref >60)
Glucose, Bld: 103 mg/dL — ABNORMAL HIGH (ref 65–99)
Potassium: 4 mmol/L (ref 3.5–5.1)
Sodium: 139 mmol/L (ref 135–145)
Total Bilirubin: 1.9 mg/dL — ABNORMAL HIGH (ref 0.3–1.2)
Total Protein: 6.9 g/dL (ref 6.5–8.1)

## 2015-05-17 LAB — CBC WITH DIFFERENTIAL/PLATELET
Basophils Absolute: 0.1 10*3/uL (ref 0–0.1)
Basophils Relative: 1 %
Eosinophils Absolute: 0.1 10*3/uL (ref 0–0.7)
Eosinophils Relative: 0 %
HCT: 48.7 % (ref 40.0–52.0)
Hemoglobin: 15.5 g/dL (ref 13.0–18.0)
Lymphocytes Relative: 10 %
Lymphs Abs: 1.4 10*3/uL (ref 1.0–3.6)
MCH: 29 pg (ref 26.0–34.0)
MCHC: 31.9 g/dL — ABNORMAL LOW (ref 32.0–36.0)
MCV: 90.8 fL (ref 80.0–100.0)
Monocytes Absolute: 1 10*3/uL (ref 0.2–1.0)
Monocytes Relative: 7 %
Neutro Abs: 11.9 10*3/uL — ABNORMAL HIGH (ref 1.4–6.5)
Neutrophils Relative %: 82 %
Platelets: 169 10*3/uL (ref 150–440)
RBC: 5.36 MIL/uL (ref 4.40–5.90)
RDW: 15 % — ABNORMAL HIGH (ref 11.5–14.5)
WBC: 14.5 10*3/uL — ABNORMAL HIGH (ref 3.8–10.6)

## 2015-05-17 LAB — LIPASE, BLOOD: Lipase: 28 U/L (ref 22–51)

## 2015-05-17 LAB — URINALYSIS COMPLETE WITH MICROSCOPIC (ARMC ONLY)
Bilirubin Urine: NEGATIVE
Glucose, UA: NEGATIVE mg/dL
Hgb urine dipstick: NEGATIVE
Ketones, ur: NEGATIVE mg/dL
Leukocytes, UA: NEGATIVE
Nitrite: NEGATIVE
Protein, ur: NEGATIVE mg/dL
Specific Gravity, Urine: 1.017 (ref 1.005–1.030)
Squamous Epithelial / LPF: NONE SEEN
pH: 5 (ref 5.0–8.0)

## 2015-05-17 MED ORDER — LEVOFLOXACIN 750 MG PO TABS: 750.0000 mg | ORAL_TABLET | Freq: Every day | ORAL | Status: DC

## 2015-05-17 MED ORDER — IOHEXOL 300 MG/ML  SOLN
100.0000 mL | Freq: Once | INTRAMUSCULAR | Status: AC | PRN
  Administered 2015-05-17: 100 mL via INTRAVENOUS

## 2015-05-17 MED ORDER — IOHEXOL 240 MG/ML SOLN
25.0000 mL | Freq: Once | INTRAMUSCULAR | Status: AC | PRN
  Administered 2015-05-17: 25 mL via ORAL

## 2015-05-17 MED ORDER — CEFTRIAXONE SODIUM IN DEXTROSE 20 MG/ML IV SOLN
1.0000 g | INTRAVENOUS | Status: AC
  Administered 2015-05-17: 1 g via INTRAVENOUS

## 2015-05-17 MED ORDER — AZITHROMYCIN 250 MG PO TABS
500.0000 mg | ORAL_TABLET | ORAL | Status: AC
  Administered 2015-05-17: 500 mg via ORAL

## 2015-05-17 MED ORDER — SODIUM CHLORIDE 0.9 % IV SOLN
1000.0000 mL | Freq: Once | INTRAVENOUS | Status: AC
  Administered 2015-05-17: 1000 mL via INTRAVENOUS

## 2015-05-17 MED ORDER — KETOROLAC TROMETHAMINE 30 MG/ML IJ SOLN
30.0000 mg | Freq: Once | INTRAMUSCULAR | Status: AC
  Administered 2015-05-17: 30 mg via INTRAVENOUS

## 2015-05-17 MED ORDER — AZITHROMYCIN 250 MG PO TABS
ORAL_TABLET | ORAL | Status: AC
  Administered 2015-05-17: 500 mg via ORAL
  Filled 2015-05-17: qty 2

## 2015-05-17 MED ORDER — ONDANSETRON HCL 4 MG/2ML IJ SOLN
INTRAMUSCULAR | Status: AC
  Administered 2015-05-17: 4 mg via INTRAVENOUS
  Filled 2015-05-17: qty 2

## 2015-05-17 MED ORDER — ONDANSETRON HCL 4 MG/2ML IJ SOLN
4.0000 mg | INTRAMUSCULAR | Status: AC
  Administered 2015-05-17: 4 mg via INTRAVENOUS

## 2015-05-17 MED ORDER — CEFTRIAXONE SODIUM IN DEXTROSE 20 MG/ML IV SOLN
INTRAVENOUS | Status: AC
  Administered 2015-05-17: 1 g via INTRAVENOUS
  Filled 2015-05-17: qty 50

## 2015-05-17 MED ORDER — MORPHINE SULFATE 4 MG/ML IJ SOLN
4.0000 mg | Freq: Once | INTRAMUSCULAR | Status: AC
  Administered 2015-05-17: 4 mg via INTRAVENOUS

## 2015-05-17 MED ORDER — MORPHINE SULFATE 4 MG/ML IJ SOLN
INTRAMUSCULAR | Status: AC
  Administered 2015-05-17: 4 mg via INTRAVENOUS
  Filled 2015-05-17: qty 1

## 2015-05-17 MED ORDER — KETOROLAC TROMETHAMINE 30 MG/ML IJ SOLN
INTRAMUSCULAR | Status: AC
  Administered 2015-05-17: 30 mg via INTRAVENOUS
  Filled 2015-05-17: qty 1

## 2015-05-17 MED ORDER — SODIUM CHLORIDE 0.9 % IV BOLUS (SEPSIS)
1000.0000 mL | INTRAVENOUS | Status: AC
  Administered 2015-05-17: 1000 mL via INTRAVENOUS

## 2015-05-17 NOTE — Discharge Instructions (Signed)
As we discussed, your abdominal exam was reassuring, but we discovered that you have a mild pneumonia.  We gave your first dose of antibiotics in the emergency department.  Please take the prescribed antibiotics as written and follow up with your primary care doctor on Monday.  Return to the emergency department if he develop new or worsening symptoms that concern you.  Be sure not to take your metformin for 48 hours.   Pneumonia Pneumonia is an infection of the lungs.  CAUSES Pneumonia may be caused by bacteria or a virus. Usually, these infections are caused by breathing infectious particles into the lungs (respiratory tract). SIGNS AND SYMPTOMS   Cough.  Fever.  Chest pain.  Increased rate of breathing.  Wheezing.  Mucus production. DIAGNOSIS  If you have the common symptoms of pneumonia, your health care provider will typically confirm the diagnosis with a chest X-ray. The X-ray will show an abnormality in the lung (pulmonary infiltrate) if you have pneumonia. Other tests of your blood, urine, or sputum may be done to find the specific cause of your pneumonia. Your health care provider may also do tests (blood gases or pulse oximetry) to see how well your lungs are working. TREATMENT  Some forms of pneumonia may be spread to other people when you cough or sneeze. You may be asked to wear a mask before and during your exam. Pneumonia that is caused by bacteria is treated with antibiotic medicine. Pneumonia that is caused by the influenza virus may be treated with an antiviral medicine. Most other viral infections must run their course. These infections will not respond to antibiotics.  HOME CARE INSTRUCTIONS   Cough suppressants may be used if you are losing too much rest. However, coughing protects you by clearing your lungs. You should avoid using cough suppressants if you can.  Your health care provider may have prescribed medicine if he or she thinks your pneumonia is caused by  bacteria or influenza. Finish your medicine even if you start to feel better.  Your health care provider may also prescribe an expectorant. This loosens the mucus to be coughed up.  Take medicines only as directed by your health care provider.  Do not smoke. Smoking is a common cause of bronchitis and can contribute to pneumonia. If you are a smoker and continue to smoke, your cough may last several weeks after your pneumonia has cleared.  A cold steam vaporizer or humidifier in your room or home may help loosen mucus.  Coughing is often worse at night. Sleeping in a semi-upright position in a recliner or using a couple pillows under your head will help with this.  Get rest as you feel it is needed. Your body will usually let you know when you need to rest. PREVENTION A pneumococcal shot (vaccine) is available to prevent a common bacterial cause of pneumonia. This is usually suggested for:  People over 1 years old.  Patients on chemotherapy.  People with chronic lung problems, such as bronchitis or emphysema.  People with immune system problems. If you are over 65 or have a high risk condition, you may receive the pneumococcal vaccine if you have not received it before. In some countries, a routine influenza vaccine is also recommended. This vaccine can help prevent some cases of pneumonia.You may be offered the influenza vaccine as part of your care. If you smoke, it is time to quit. You may receive instructions on how to stop smoking. Your health care provider can provide  medicines and counseling to help you quit. SEEK MEDICAL CARE IF: You have a fever. SEEK IMMEDIATE MEDICAL CARE IF:   Your illness becomes worse. This is especially true if you are elderly or weakened from any other disease.  You cannot control your cough with suppressants and are losing sleep.  You begin coughing up blood.  You develop pain which is getting worse or is uncontrolled with medicines.  Any of  the symptoms which initially brought you in for treatment are getting worse rather than better.  You develop shortness of breath or chest pain. MAKE SURE YOU:   Understand these instructions.  Will watch your condition.  Will get help right away if you are not doing well or get worse. Document Released: 11/02/2005 Document Revised: 03/19/2014 Document Reviewed: 01/22/2011 [facility] Patient Information 2015 [place], [place]. This information is not intended to replace advice given to you by your health care provider. Make sure you discuss any questions you have with your health care provider.

## 2015-05-17 NOTE — ED Notes (Signed)
Pt finished with oral contrast, ct notified.

## 2015-05-17 NOTE — ED Notes (Signed)
Has been taking polyethelene glycol for constipation but pt states it has left him light headed, weak, and "wiped out".  States he passed out a couple of times.  States he fell but didn't hit head.

## 2015-05-17 NOTE — ED Notes (Signed)
Urine specimen at bedside, will send when pt returns to room.  Unable to scan barcode atm

## 2015-05-17 NOTE — ED Notes (Signed)
Pt transported to CT ?

## 2015-05-17 NOTE — ED Notes (Signed)
Patient with no complaints at this time. Respirations even and unlabored. Skin warm/dry. Discharge instructions reviewed with patient at this time. Patient given opportunity to voice concerns/ask questions. IV removed per policy and band-aid applied to site. Patient discharged at this time and left Emergency Department with steady gait.  

## 2015-05-17 NOTE — ED Notes (Signed)
Dr. Karma Greaser informed of patient's abdominal pain.

## 2015-05-17 NOTE — ED Provider Notes (Signed)
[facility] Emergency Department Provider Note  ____________________________________________  Time seen: Approximately 7:44 PM  I have reviewed the triage vital signs and the nursing notes.   HISTORY  Chief Complaint Dehydration    HPI Lawrence [name] is a 59 y.o. male with chronic abdominal problems including constipation and reported celiac disease who presents with abdominal pain, nausea, and "feeling dehydrated".  He states that he has been constipated and he followed Dr. Jackalyn Lombard recommendations to take MiraLAX mixed with Gatorade.  He has had multiple large volume bowel movement since that time and feels that it is made him dehydrated.  He describes generalized weakness and malaise with moderate aching generalized abdominal pain which is gradual in onset but persistent.  Eating makes it worse.  He also states that it feels similar to his prior episodes of pancreatitis.  He endorses persistent severe nausea but no vomiting.   Past Medical History  Diagnosis Date  . Hypertension   . Coronary artery disease   . COPD (chronic obstructive pulmonary disease)   . Chronic bronchitis   . Emphysema lung   . Diabetes mellitus without complication   . Asthma     There are no active problems to display for this patient.   Past Surgical History  Procedure Laterality Date  . Vascular surgery    . Bypass graft    . Coronary angioplasty with stent placement    . Tonsillectomy    . Cholecystectomy    . Esophageal dilation      Current Outpatient Rx  Name  Route  Sig  Dispense  Refill  . ALPRAZolam (XANAX) 1 MG tablet   Oral   Take 1 mg by mouth 3 (three) times daily.          Marland Kitchen aspirin EC 81 MG tablet   Oral   Take 81 mg by mouth every morning.         Marland Kitchen atorvastatin (LIPITOR) 80 MG tablet   Oral   Take 1 tablet (80 mg total) by mouth at bedtime.   90 tablet   3   . buPROPion (WELLBUTRIN SR) 150 MG 12 hr tablet   Oral   Take 150 mg by mouth 2  (two) times daily.         . carvedilol (COREG) 12.5 MG tablet   Oral   Take 12.5 mg by mouth 2 (two) times daily.         . cetirizine (ZYRTEC) 10 MG tablet   Oral   Take 10 mg by mouth every morning.         . citalopram (CELEXA) 20 MG tablet   Oral   Take 20 mg by mouth daily.         . clopidogrel (PLAVIX) 75 MG tablet      TAKE ONE (1) TABLET EACH DAY   30 tablet   5   . esomeprazole (NEXIUM) 40 MG capsule   Oral   Take 40 mg by mouth every morning.         . Fluticasone-Salmeterol (ADVAIR) 500-50 MCG/DOSE AEPB   Inhalation   Inhale 1 puff into the lungs 2 (two) times daily.         . furosemide (LASIX) 40 MG tablet   Oral   Take 40 mg by mouth every morning.         . hyoscyamine (LEVSIN SL) 0.125 MG SL tablet   Sublingual   Place 0.125 mg under the tongue every  6 (six) hours.          . isosorbide mononitrate (IMDUR) 30 MG 24 hr tablet   Oral   Take 30 mg by mouth every morning.         . lactulose (CHRONULAC) 10 GM/15ML solution   Oral   Take 30 mLs (20 g total) by mouth daily as needed for mild constipation.   120 mL   0   . lansoprazole (PREVACID) 30 MG capsule   Oral   Take 30 mg by mouth every morning.         . levalbuterol (XOPENEX HFA) 45 MCG/ACT inhaler   Inhalation   Inhale 1-2 puffs into the lungs every 4 (four) hours as needed for shortness of breath.          . levofloxacin (LEVAQUIN) 750 MG tablet   Oral   Take 1 tablet (750 mg total) by mouth daily.   5 tablet   0   . lisinopril (PRINIVIL,ZESTRIL) 20 MG tablet   Oral   Take 20 mg by mouth daily.         Marland Kitchen lisinopril-hydrochlorothiazide (PRINZIDE,ZESTORETIC) 20-12.5 MG per tablet   Oral   Take 1 tablet by mouth every morning.         . magnesium citrate SOLN   Oral   Take 296 mLs (1 Bottle total) by mouth once.   195 mL   0   . metFORMIN (GLUCOPHAGE) 500 MG tablet   Oral   Take 1 tablet (500 mg total) by mouth every morning.   90 tablet   4    . nitroGLYCERIN (NITROSTAT) 0.4 MG SL tablet   Sublingual   Place 1 tablet (0.4 mg total) under the tongue every 5 (five) minutes as needed for chest pain. Every 5 minutes as needed for chest pain   30 tablet   1   . omega-3 acid ethyl esters (LOVAZA) 1 G capsule   Oral   Take 4 g by mouth every morning.         Marland Kitchen oxyCODONE-acetaminophen (PERCOCET) 10-325 MG per tablet   Oral   Take 1 tablet by mouth every 4 (four) hours as needed for pain.   150 tablet   0   . polyethylene glycol (MIRALAX / GLYCOLAX) packet   Oral   Take 17 g by mouth daily.   14 each   0   . potassium chloride (K-DUR,KLOR-CON) 10 MEQ tablet   Oral   Take 10 mEq by mouth every morning.         . ranitidine (ZANTAC) 150 MG tablet   Oral   Take 300 mg by mouth 2 (two) times daily.         . tamsulosin (FLOMAX) 0.4 MG CAPS capsule   Oral   Take 0.4 mg by mouth daily.         Marland Kitchen tiotropium (SPIRIVA) 18 MCG inhalation capsule   Inhalation   Place 18 mcg into inhaler and inhale daily.           Allergies Demerol and Sulfa antibiotics  No family history on file.  Social History History  Substance Use Topics  . Smoking status: Former Research scientist (life sciences)  . Smokeless tobacco: Not on file  . Alcohol Use: No    Review of Systems Constitutional: No fever/chills Eyes: No visual changes. ENT: No sore throat. Cardiovascular: Denies chest pain. Respiratory: Denies shortness of breath. Gastrointestinal: Aching generalized abdominal pain with nausea but no vomiting  No  diarrhea.  Recent constipation. Genitourinary: Negative for dysuria. Musculoskeletal: Negative for back pain. Skin: Negative for rash. Neurological: Negative for headaches, focal weakness or numbness.  10-point ROS otherwise negative.  ____________________________________________   PHYSICAL EXAM:  VITAL SIGNS: ED Triage Vitals  Enc Vitals Group     BP 05/17/15 1546 93/59 mmHg     Pulse Rate 05/17/15 1546 71     Resp 05/17/15  1546 18     Temp 05/17/15 1546 98.2 F (36.8 C)     Temp Source 05/17/15 1546 Oral     SpO2 05/17/15 1546 99 %     Weight 05/17/15 1546 221 lb (100.245 kg)     Height 05/17/15 1546 5' 7"  (1.702 m)     Head Cir --      Peak Flow --      Pain Score 05/17/15 1547 0     Pain Loc --      Pain Edu? --      Excl. in [place]? --     Constitutional: Alert and oriented.  Disheveled and dirty.  no acute distress. Eyes: Conjunctivae are normal. PERRL. EOMI. Head: Atraumatic. Nose: No congestion/rhinnorhea. Mouth/Throat: Mucous membranes are moist.  Oropharynx non-erythematous. Neck: No stridor.   Cardiovascular: Normal rate, regular rhythm. Grossly normal heart sounds.  Good peripheral circulation. Respiratory: Normal respiratory effort.  No retractions. Lungs CTAB. Gastrointestinal: Soft and obese with generalized mild to moderate tenderness to palpation. No abdominal bruits. No CVA tenderness. Musculoskeletal: No lower extremity tenderness nor edema.  No joint effusions. Neurologic:  Normal speech and language. No gross focal neurologic deficits are appreciated. Speech is normal. Skin:  Skin is warm, dry and intact. No rash noted. Psychiatric: Mood and affect are normal. Speech and behavior are normal.  ____________________________________________   LABS (all labs ordered are listed, but only abnormal results are displayed)  Labs Reviewed  CBC WITH DIFFERENTIAL/PLATELET - Abnormal; Notable for the following:    WBC 14.5 (*)    MCHC 31.9 (*)    RDW 15.0 (*)    Neutro Abs 11.9 (*)    All other components within normal limits  COMPREHENSIVE METABOLIC PANEL - Abnormal; Notable for the following:    Glucose, Bld 103 (*)    Total Bilirubin 1.9 (*)    All other components within normal limits  URINALYSIS COMPLETEWITH MICROSCOPIC ([facility] ONLY) - Abnormal; Notable for the following:    Color, Urine AMBER (*)    APPearance CLEAR (*)    Bacteria, UA RARE (*)    All other components within  normal limits  LIPASE, BLOOD   ____________________________________________  EKG  ED ECG REPORT I, Damare Serano, the attending physician, personally viewed and interpreted this ECG.  Date: 05/17/2015 EKG Time: 16:06 Rate: 71 Rhythm: normal sinus rhythm QRS Axis: normal Intervals: normal ST/T Wave abnormalities: normal Conduction Disutrbances: none Narrative Interpretation: unremarkable  ____________________________________________  RADIOLOGY  Ct Abdomen Pelvis W Contrast  05/17/2015   CLINICAL DATA:  generalized abd pain and nausea diffuse abdominal pain and constipation. Celiac disease. Pancreatitis.  EXAM: CT ABDOMEN AND PELVIS WITH CONTRAST  TECHNIQUE: Multidetector CT imaging of the abdomen and pelvis was performed using the standard protocol following bolus administration of intravenous contrast.  CONTRAST:  1105m OMNIPAQUE IOHEXOL 300 MG/ML  SOLN  COMPARISON:  CT 03/07/2015.  FINDINGS: Musculoskeletal: Mild lumbar degenerative disc and facet disease. Degenerative facet disease most pronounced at L4-L5. Subchondral cysts are present in the superior L5 articular processes bilaterally. No aggressive osseous lesions.  Lung  Bases: Tree-in-bud micronodularity in the RIGHT lower lobe. Dependent atelectasis. This pattern is most commonly associated with bronchopneumonia but can also be seen in other forms of airspace disease such as aspiration.  Liver:  Normal.  Spleen:  Normal.  Gallbladder:  Cholecystectomy.  Common bile duct:  Normal.  Pancreas:  Normal.  Adrenal glands: Small LEFT adrenal nodule is chronic and appears slightly smaller than on the prior exam from 2012, compatible with an adenoma.  Kidneys: Normal enhancement and delayed excretion of contrast. LEFT ureter normal. RIGHT ureter normal.  Stomach: Small hiatal hernia. Thickening of the distal esophagus, most commonly associated with reflux.  Small bowel:  Normal.  Colon:   Normal appendix.  Normal appearance of the colon.   Pelvic Genitourinary:  Normal.  Peritoneum: No free air or free fluid.  Vascular/lymphatic: Atherosclerosis.  No acute vascular abnormality.  Body Wall: Fat containing LEFT inguinal hernia. Small fat containing periumbilical hernia.  IMPRESSION: 1. New RIGHT lower lobe airspace disease compatible with bronchopneumonia. 2. No acute abdominal abnormality. 3. Atherosclerosis. 4. Cholecystectomy. 5. Small hiatal hernia and distal esophageal thickening, most commonly associated with gastroesophageal reflux. 6. Stable LEFT adrenal adenoma.   Electronically Signed   By: Dereck Ligas M.D.   On: 05/17/2015 20:30   Dg Abd 2 Views  05/05/2015   CLINICAL DATA:  Abdominal pain with bloating for several days  EXAM: ABDOMEN - 2 VIEW  COMPARISON:  CT abdomen and pelvis March 07, 2015.  FINDINGS: Supine and upright images obtained. There is diffuse stool throughout the colon. There is no bowel dilatation or air-fluid level suggesting obstruction. No free air. There are phleboliths in the pelvis. There are surgical clips in right upper quadrant. There is atelectatic change in the left base. Lung bases otherwise are clear.  IMPRESSION: Diffuse stool throughout colon.  No obstruction or free air.   Electronically Signed   By: Lowella Grip III M.D.   On: 05/05/2015 19:19   Dg Abd Acute W/chest  05/11/2015   CLINICAL DATA:  Continued abdominal pain, constipation. History of emphysema.  EXAM: DG ABDOMEN ACUTE W/ 1V CHEST  COMPARISON:  Acute abdominal series May 08, 2015  FINDINGS: Cardiac silhouette is normal in size, status post median sternotomy. Mild chronic bronchitic changes with bibasilar atelectasis. No pleural effusion or focal consolidation. No pneumothorax. Symmetric calcifications in the neck are likely vascular.  Moderate amount of retained large bowel stool, bowel gas pattern is nondilated and nonobstructive. Surgical clips in the included right abdomen likely reflect cholecystectomy. No intra-abdominal mass  effect or pathologic calcifications. Mild aortoiliac vascular calcifications. Soft tissue planes and included osseous structure nonsuspicious.  IMPRESSION: Mild chronic bronchitic changes without acute cardiopulmonary process.  Moderate amount of retained large bowel stool, similar without bowel obstruction.   Electronically Signed   By: Elon Alas M.D.   On: 05/11/2015 00:01   Dg Abd Acute W/chest  05/08/2015   CLINICAL DATA:  Subacute onset of generalized abdominal pain for 3 weeks. Constipation. Initial encounter.  EXAM: DG ABDOMEN ACUTE W/ 1V CHEST  COMPARISON:  Abdominal radiograph performed 05/05/2015  FINDINGS: The lungs are well-aerated. Vascular congestion is noted. Peribronchial thickening is seen. Mild bibasilar atelectasis is noted. There is no evidence of pleural effusion or pneumothorax. The cardiomediastinal silhouette is normal in size. The patient is status post median sternotomy.  The visualized bowel gas pattern is unremarkable. Scattered stool and air are seen within the colon; there is no evidence of small bowel dilatation to suggest obstruction.  No free intra-abdominal air is identified on the provided upright view. Clips are noted within the right upper quadrant, reflecting prior cholecystectomy.  No acute osseous abnormalities are seen; the sacroiliac joints are unremarkable in appearance.  IMPRESSION: 1. Unremarkable bowel gas pattern; no free intra-abdominal air seen. Stool burden has decreased from the prior study, with a small to moderate amount of stool still seen in the colon. 2. Vascular congestion noted. Peribronchial thickening seen. Mild bibasilar atelectasis noted.   Electronically Signed   By: Garald Balding M.D.   On: 05/08/2015 23:34    ____________________________________________   PROCEDURES  Procedure(s) performed: None  Critical Care performed: No ____________________________________________   INITIAL IMPRESSION / ASSESSMENT AND PLAN / ED  COURSE  Pertinent labs & imaging results that were available during my care of the patient were reviewed by me and considered in my medical decision making (see chart for details).  The patient presented mildly hypotensive and has a leukocytosis, neither of which were present during a prior recent visit to the emergency department.  I have added on a lipase and we will obtain a CT scan of his abdomen and pelvis.  He does not appear to be in any distress although he is reporting moderate abdominal pain.  I explained that narcotic pain medication will make his constipation worse again, but he requests a dose, so I will give him a dose of morphine while we are awaiting CT scan results.  He already received 1 L normal saline and his blood pressure has improved to the 768G systolic.   (Note that documentation was delayed due to multiple ED patients requiring immediate care.)   The patient remained hemodynamically stable throughout his stay in the emergency department.  His workup was notable for a mild pneumonia.  Since he is hemodynamically stable with reassuring vital signs and reassuring lab work, I believe he is appropriate for outpatient treatment for community-acquired pneumonia.  I discussed the findings with him and gave my usual and customary return precautions.  I gave him Levaquin given his comorbidities. ____________________________________________  FINAL CLINICAL IMPRESSION(S) / ED DIAGNOSES  Final diagnoses:  Generalized abdominal pain  Nausea  Community acquired pneumonia      NEW MEDICATIONS STARTED DURING THIS VISIT:  Discharge Medication List as of 05/17/2015 11:06 PM    START taking these medications   Details  levofloxacin (LEVAQUIN) 750 MG tablet Take 1 tablet (750 mg total) by mouth daily., Starting 05/17/2015, Until Discontinued, Print         Hinda Kehr, MD 05/18/15 [phone]

## 2015-05-21 ENCOUNTER — Emergency Department
Admission: EM | Admit: 2015-05-21 | Discharge: 2015-05-21 | Disposition: A | Discharge status: Home / Self Care | Payer: Medicare PPO | Attending: Emergency Medicine | Admitting: Emergency Medicine

## 2015-05-21 ENCOUNTER — Encounter: Payer: Self-pay | Admitting: *Deleted

## 2015-05-21 DIAGNOSIS — R109 Unspecified abdominal pain: Secondary | ICD-10-CM | POA: Diagnosis not present

## 2015-05-21 DIAGNOSIS — I10 Essential (primary) hypertension: Secondary | ICD-10-CM | POA: Diagnosis not present

## 2015-05-21 DIAGNOSIS — Z7982 Long term (current) use of aspirin: Secondary | ICD-10-CM | POA: Diagnosis not present

## 2015-05-21 DIAGNOSIS — Z7902 Long term (current) use of antithrombotics/antiplatelets: Secondary | ICD-10-CM | POA: Insufficient documentation

## 2015-05-21 DIAGNOSIS — Z792 Long term (current) use of antibiotics: Secondary | ICD-10-CM | POA: Diagnosis not present

## 2015-05-21 DIAGNOSIS — G8929 Other chronic pain: Secondary | ICD-10-CM | POA: Insufficient documentation

## 2015-05-21 DIAGNOSIS — Z79899 Other long term (current) drug therapy: Secondary | ICD-10-CM | POA: Diagnosis not present

## 2015-05-21 DIAGNOSIS — Z7951 Long term (current) use of inhaled steroids: Secondary | ICD-10-CM | POA: Insufficient documentation

## 2015-05-21 DIAGNOSIS — Z87891 Personal history of nicotine dependence: Secondary | ICD-10-CM | POA: Diagnosis not present

## 2015-05-21 DIAGNOSIS — L03115 Cellulitis of right lower limb: Secondary | ICD-10-CM | POA: Diagnosis not present

## 2015-05-21 DIAGNOSIS — R2241 Localized swelling, mass and lump, right lower limb: Secondary | ICD-10-CM | POA: Diagnosis present

## 2015-05-21 DIAGNOSIS — E119 Type 2 diabetes mellitus without complications: Secondary | ICD-10-CM | POA: Insufficient documentation

## 2015-05-21 LAB — CBC
HCT: 47.4 % (ref 40.0–52.0)
Hemoglobin: 15.7 g/dL (ref 13.0–18.0)
MCH: 29.7 pg (ref 26.0–34.0)
MCHC: 33.1 g/dL (ref 32.0–36.0)
MCV: 89.7 fL (ref 80.0–100.0)
Platelets: 181 10*3/uL (ref 150–440)
RBC: 5.28 MIL/uL (ref 4.40–5.90)
RDW: 14.8 % — ABNORMAL HIGH (ref 11.5–14.5)
WBC: 5.6 10*3/uL (ref 3.8–10.6)

## 2015-05-21 MED ORDER — CEPHALEXIN 500 MG PO CAPS: 500.0000 mg | ORAL_CAPSULE | Freq: Three times a day (TID) | ORAL | Status: AC

## 2015-05-21 MED ORDER — CEPHALEXIN 500 MG PO CAPS
500.0000 mg | ORAL_CAPSULE | Freq: Once | ORAL | Status: AC
  Administered 2015-05-21: 500 mg via ORAL

## 2015-05-21 MED ORDER — CEPHALEXIN 500 MG PO CAPS
ORAL_CAPSULE | ORAL | Status: AC
  Administered 2015-05-21: 500 mg via ORAL
  Filled 2015-05-21: qty 1

## 2015-05-21 NOTE — Discharge Instructions (Signed)
Cellulitis Cellulitis is an infection of the skin and the tissue under the skin. The infected area is usually red and tender. This happens most often in the arms and lower legs. HOME CARE   Take your antibiotic medicine as told. Finish the medicine even if you start to feel better.  Keep the infected arm or leg raised (elevated).  Put a warm cloth on the area up to 4 times per day.  Only take medicines as told by your doctor.  Keep all doctor visits as told. GET HELP IF:  You see red streaks on the skin coming from the infected area.  Your red area gets bigger or turns a dark color.  Your bone or joint under the infected area is painful after the skin heals.  Your infection comes back in the same area or different area.  You have a puffy (swollen) bump in the infected area.  You have new symptoms.  You have a fever. GET HELP RIGHT AWAY IF:   You feel very sleepy.  You throw up (vomit) or have watery poop (diarrhea).  You feel sick and have muscle aches and pains. MAKE SURE YOU:   Understand these instructions.  Will watch your condition.  Will get help right away if you are not doing well or get worse. Document Released: 04/20/2008 Document Revised: 03/19/2014 Document Reviewed: 01/18/2012 [facility] Patient Information 2015 [place], [place]. This information is not intended to replace advice given to you by your health care provider. Make sure you discuss any questions you have with your health care provider.     As we have discussed please follow-up your primary care provider this week for recheck of your leg. Return to the emergency department for any increased redness, swelling, or increased pain.

## 2015-05-21 NOTE — ED Notes (Signed)
Past 3 days c/o right lower leg swelling

## 2015-05-21 NOTE — ED Notes (Signed)
Very minimal reddness, swelling noted

## 2015-05-21 NOTE — ED Notes (Addendum)
x3 days , lower right leg discoloration , pain, swelling. No injury , sensation difference distal from region of complaint

## 2015-05-21 NOTE — ED Provider Notes (Signed)
[facility] Emergency Department Provider Note  Time seen: 7:36 PM  I have reviewed the triage vital signs and the nursing notes.   HISTORY  Chief Complaint Leg Swelling    HPI Lawrence [name] is a 59 y.o. male who presents the emergency department with 3 days of right leg pain and redness. According to the patient for the past 3 days he has noticed 2 lesions to the right leg with surrounding erythema/redness and pain to these areas. Patient states he is a diabetic and was worried that they could be getting infected. Patient denies any swelling to the leg (contrary to triage note). Patient denies any fever, nausea or vomiting. He does state some mild abdominal pain but states this is chronic. Describes his right leg pain as mild to moderate.     Past Medical History  Diagnosis Date  . Hypertension   . Coronary artery disease   . COPD (chronic obstructive pulmonary disease)   . Chronic bronchitis   . Emphysema lung   . Diabetes mellitus without complication   . Asthma     There are no active problems to display for this patient.   Past Surgical History  Procedure Laterality Date  . Vascular surgery    . Bypass graft    . Coronary angioplasty with stent placement    . Tonsillectomy    . Cholecystectomy    . Esophageal dilation      Current Outpatient Rx  Name  Route  Sig  Dispense  Refill  . ALPRAZolam (XANAX) 1 MG tablet   Oral   Take 1 mg by mouth 3 (three) times daily.          Marland Kitchen aspirin EC 81 MG tablet   Oral   Take 81 mg by mouth every morning.         Marland Kitchen atorvastatin (LIPITOR) 80 MG tablet   Oral   Take 1 tablet (80 mg total) by mouth at bedtime.   90 tablet   3   . buPROPion (WELLBUTRIN SR) 150 MG 12 hr tablet   Oral   Take 150 mg by mouth 2 (two) times daily.         . carvedilol (COREG) 12.5 MG tablet   Oral   Take 12.5 mg by mouth 2 (two) times daily.         . cetirizine (ZYRTEC) 10 MG tablet   Oral   Take 10  mg by mouth every morning.         . citalopram (CELEXA) 20 MG tablet   Oral   Take 20 mg by mouth daily.         . clopidogrel (PLAVIX) 75 MG tablet      TAKE ONE (1) TABLET EACH DAY   30 tablet   5   . esomeprazole (NEXIUM) 40 MG capsule   Oral   Take 40 mg by mouth every morning.         . Fluticasone-Salmeterol (ADVAIR) 500-50 MCG/DOSE AEPB   Inhalation   Inhale 1 puff into the lungs 2 (two) times daily.         . furosemide (LASIX) 40 MG tablet   Oral   Take 40 mg by mouth every morning.         . hyoscyamine (LEVSIN SL) 0.125 MG SL tablet   Sublingual   Place 0.125 mg under the tongue every 6 (six) hours.          Marland Kitchen  isosorbide mononitrate (IMDUR) 30 MG 24 hr tablet   Oral   Take 30 mg by mouth every morning.         . lactulose (CHRONULAC) 10 GM/15ML solution   Oral   Take 30 mLs (20 g total) by mouth daily as needed for mild constipation.   120 mL   0   . lansoprazole (PREVACID) 30 MG capsule   Oral   Take 30 mg by mouth every morning.         . levalbuterol (XOPENEX HFA) 45 MCG/ACT inhaler   Inhalation   Inhale 1-2 puffs into the lungs every 4 (four) hours as needed for shortness of breath.          . levofloxacin (LEVAQUIN) 750 MG tablet   Oral   Take 1 tablet (750 mg total) by mouth daily.   5 tablet   0   . lisinopril (PRINIVIL,ZESTRIL) 20 MG tablet   Oral   Take 20 mg by mouth daily.         Marland Kitchen lisinopril-hydrochlorothiazide (PRINZIDE,ZESTORETIC) 20-12.5 MG per tablet   Oral   Take 1 tablet by mouth every morning.         . magnesium citrate SOLN   Oral   Take 296 mLs (1 Bottle total) by mouth once.   195 mL   0   . metFORMIN (GLUCOPHAGE) 500 MG tablet   Oral   Take 1 tablet (500 mg total) by mouth every morning.   90 tablet   4   . nitroGLYCERIN (NITROSTAT) 0.4 MG SL tablet   Sublingual   Place 1 tablet (0.4 mg total) under the tongue every 5 (five) minutes as needed for chest pain. Every 5 minutes as needed  for chest pain   30 tablet   1   . omega-3 acid ethyl esters (LOVAZA) 1 G capsule   Oral   Take 4 g by mouth every morning.         Marland Kitchen oxyCODONE-acetaminophen (PERCOCET) 10-325 MG per tablet   Oral   Take 1 tablet by mouth every 4 (four) hours as needed for pain.   150 tablet   0   . polyethylene glycol (MIRALAX / GLYCOLAX) packet   Oral   Take 17 g by mouth daily.   14 each   0   . potassium chloride (K-DUR,KLOR-CON) 10 MEQ tablet   Oral   Take 10 mEq by mouth every morning.         . ranitidine (ZANTAC) 150 MG tablet   Oral   Take 300 mg by mouth 2 (two) times daily.         . tamsulosin (FLOMAX) 0.4 MG CAPS capsule   Oral   Take 0.4 mg by mouth daily.         Marland Kitchen tiotropium (SPIRIVA) 18 MCG inhalation capsule   Inhalation   Place 18 mcg into inhaler and inhale daily.           Allergies Demerol and Sulfa antibiotics  No family history on file.  Social History History  Substance Use Topics  . Smoking status: Former Research scientist (life sciences)  . Smokeless tobacco: Not on file  . Alcohol Use: No    Review of Systems Constitutional: Negative for fever. Cardiovascular: Negative for chest pain. Respiratory: Negative for shortness of breath. Gastrointestinal: Mild abdominal pain which the patient states is chronic. Negative for nausea or vomiting. Musculoskeletal: Negative for back pain. Skin: Two small red areas to the right lower extremity.  Neurological: Negative for headache 10-point ROS otherwise negative.  ____________________________________________   PHYSICAL EXAM:  VITAL SIGNS: ED Triage Vitals  Enc Vitals Group     BP 05/21/15 1743 152/90 mmHg     Pulse Rate 05/21/15 1743 77     Resp 05/21/15 1743 20     Temp 05/21/15 1743 98.1 F (36.7 C)     Temp Source 05/21/15 1743 Oral     SpO2 05/21/15 1743 93 %     Weight 05/21/15 1743 221 lb (100.245 kg)     Height 05/21/15 1743 5' 7"  (1.702 m)     Head Cir --      Peak Flow --      Pain Score 05/21/15  1744 8     Pain Loc --      Pain Edu? --      Excl. in [place]? --     Constitutional: Alert and oriented. Well appearing and in no distress. ENT   Mouth/Throat: Mucous membranes are moist. Cardiovascular: Normal rate, regular rhythm. No murmur Respiratory: Normal respiratory effort without tachypnea nor retractions. Breath sounds are clear and equal bilaterally.  Gastrointestinal: Soft, mild diffuse abdominal tenderness, patient states this is normal. No distention. No rebound or guarding. Musculoskeletal: Patient has two quarter sized lesions to the right lower extremity with surrounding erythema. No sign of abscess. No calf tenderness or popliteal fossa tenderness, no appreciable leg swelling. Do not suspect DVT clinically. Neurologic:  Normal speech and language. No gross focal neurologic deficits  Skin:  Skin is warm, dry and intact.  Psychiatric: Mood and affect are normal. Speech and behavior are normal.  ____________________________________________    INITIAL IMPRESSION / ASSESSMENT AND PLAN / ED COURSE  Pertinent labs & imaging results that were available during my care of the patient were reviewed by me and considered in my medical decision making (see chart for details).  Patient with possible mild/early cellulitis to the right lower extremity. Do not suspect DVT given clinical exam. Patient denies fever, nausea or vomiting. We will check basic labs, and start the patient on Keflex. The patient is to follow-up with his primary care doctor. Patient agreeable to plan.  Patient's CBC has resulted within normal limits. Patient's BMP remains pending, they are asking to go home at this time and they do not wish to wait for the BMP results. I have called lab 3 times now regarding this chemistry (among others) they're apparently having problems with the machine. We will discharge the patient home at this time and he will follow-up with his primary care  doctor.  ____________________________________________   FINAL CLINICAL IMPRESSION(S) / ED DIAGNOSES  Right lower extremity cellulitis   Harvest Dark, MD 05/21/15 2128

## 2015-05-22 ENCOUNTER — Other Ambulatory Visit: Payer: Self-pay | Admitting: Family Medicine

## 2015-05-22 LAB — BASIC METABOLIC PANEL
Anion gap: 9 (ref 5–15)
BUN: 12 mg/dL (ref 6–20)
CO2: 26 mmol/L (ref 22–32)
Calcium: 9.2 mg/dL (ref 8.9–10.3)
Chloride: 106 mmol/L (ref 101–111)
Creatinine, Ser: 1.22 mg/dL (ref 0.61–1.24)
GFR calc Af Amer: >60 mL/min (ref >60)
GFR calc non Af Amer: >60 mL/min (ref >60)
Glucose, Bld: 111 mg/dL — ABNORMAL HIGH (ref 65–99)
Potassium: 3.7 mmol/L (ref 3.5–5.1)
Sodium: 141 mmol/L (ref 135–145)

## 2015-05-25 ENCOUNTER — Emergency Department: Payer: Medicare PPO

## 2015-05-25 ENCOUNTER — Emergency Department
Admission: EM | Admit: 2015-05-25 | Discharge: 2015-05-25 | Disposition: A | Discharge status: Home / Self Care | Payer: Medicare PPO | Attending: Emergency Medicine | Admitting: Emergency Medicine

## 2015-05-25 ENCOUNTER — Other Ambulatory Visit: Payer: Self-pay

## 2015-05-25 ENCOUNTER — Encounter: Payer: Self-pay | Admitting: General Practice

## 2015-05-25 DIAGNOSIS — Z792 Long term (current) use of antibiotics: Secondary | ICD-10-CM | POA: Insufficient documentation

## 2015-05-25 DIAGNOSIS — I10 Essential (primary) hypertension: Secondary | ICD-10-CM | POA: Insufficient documentation

## 2015-05-25 DIAGNOSIS — Z7902 Long term (current) use of antithrombotics/antiplatelets: Secondary | ICD-10-CM | POA: Diagnosis not present

## 2015-05-25 DIAGNOSIS — E119 Type 2 diabetes mellitus without complications: Secondary | ICD-10-CM | POA: Insufficient documentation

## 2015-05-25 DIAGNOSIS — Z7951 Long term (current) use of inhaled steroids: Secondary | ICD-10-CM | POA: Diagnosis not present

## 2015-05-25 DIAGNOSIS — R0789 Other chest pain: Secondary | ICD-10-CM | POA: Diagnosis not present

## 2015-05-25 DIAGNOSIS — Z79899 Other long term (current) drug therapy: Secondary | ICD-10-CM | POA: Diagnosis not present

## 2015-05-25 DIAGNOSIS — Z87891 Personal history of nicotine dependence: Secondary | ICD-10-CM | POA: Diagnosis not present

## 2015-05-25 DIAGNOSIS — Z7982 Long term (current) use of aspirin: Secondary | ICD-10-CM | POA: Diagnosis not present

## 2015-05-25 DIAGNOSIS — R1010 Upper abdominal pain, unspecified: Secondary | ICD-10-CM | POA: Diagnosis present

## 2015-05-25 HISTORY — DX: Celiac disease: K90.0

## 2015-05-25 LAB — URINALYSIS COMPLETE WITH MICROSCOPIC (ARMC ONLY)
Bacteria, UA: NONE SEEN
Bilirubin Urine: NEGATIVE
Glucose, UA: NEGATIVE mg/dL
Hgb urine dipstick: NEGATIVE
Ketones, ur: NEGATIVE mg/dL
Leukocytes, UA: NEGATIVE
Nitrite: NEGATIVE
Protein, ur: NEGATIVE mg/dL
Specific Gravity, Urine: 1.015 (ref 1.005–1.030)
pH: 5 (ref 5.0–8.0)

## 2015-05-25 LAB — BASIC METABOLIC PANEL
Anion gap: 7 (ref 5–15)
BUN: 7 mg/dL (ref 6–20)
CO2: 29 mmol/L (ref 22–32)
Calcium: 8.9 mg/dL (ref 8.9–10.3)
Chloride: 108 mmol/L (ref 101–111)
Creatinine, Ser: 0.88 mg/dL (ref 0.61–1.24)
GFR calc Af Amer: >60 mL/min (ref >60)
GFR calc non Af Amer: >60 mL/min (ref >60)
Glucose, Bld: 89 mg/dL (ref 65–99)
Potassium: 4 mmol/L (ref 3.5–5.1)
Sodium: 144 mmol/L (ref 135–145)

## 2015-05-25 LAB — CBC
HCT: 47.1 % (ref 40.0–52.0)
Hemoglobin: 15.1 g/dL (ref 13.0–18.0)
MCH: 28.5 pg (ref 26.0–34.0)
MCHC: 32.1 g/dL (ref 32.0–36.0)
MCV: 88.8 fL (ref 80.0–100.0)
Platelets: 206 10*3/uL (ref 150–440)
RBC: 5.31 MIL/uL (ref 4.40–5.90)
RDW: 14.5 % (ref 11.5–14.5)
WBC: 6.1 10*3/uL (ref 3.8–10.6)

## 2015-05-25 LAB — TROPONIN I: Troponin I: <0.03 ng/mL (ref <0.031)

## 2015-05-25 MED ORDER — IOHEXOL 300 MG/ML  SOLN
100.0000 mL | Freq: Once | INTRAMUSCULAR | Status: AC | PRN
  Administered 2015-05-25: 100 mL via INTRAVENOUS

## 2015-05-25 MED ORDER — HYDROMORPHONE HCL 1 MG/ML IJ SOLN
0.5000 mg | Freq: Once | INTRAMUSCULAR | Status: AC
  Administered 2015-05-25: 0.5 mg via INTRAVENOUS

## 2015-05-25 MED ORDER — ONDANSETRON HCL 4 MG/2ML IJ SOLN
4.0000 mg | Freq: Once | INTRAMUSCULAR | Status: AC
  Administered 2015-05-25: 4 mg via INTRAVENOUS

## 2015-05-25 MED ORDER — IOHEXOL 240 MG/ML SOLN
25.0000 mL | Freq: Once | INTRAMUSCULAR | Status: DC | PRN
Start: 2015-05-25 — End: 2015-05-26

## 2015-05-25 MED ORDER — ONDANSETRON HCL 4 MG/2ML IJ SOLN
INTRAMUSCULAR | Status: AC
  Administered 2015-05-25: 4 mg via INTRAVENOUS
  Filled 2015-05-25: qty 2

## 2015-05-25 MED ORDER — HYDROMORPHONE HCL 1 MG/ML IJ SOLN
INTRAMUSCULAR | Status: AC
  Administered 2015-05-25: 0.5 mg via INTRAVENOUS
  Filled 2015-05-25: qty 1

## 2015-05-25 NOTE — ED Notes (Signed)
Took his pain med for celiac cramping at 1pm but states doesn't work

## 2015-05-25 NOTE — ED Provider Notes (Signed)
[facility] Emergency Department Provider Note  ____________________________________________  Time seen: Approximately 8:11 PM  I have reviewed the triage vital signs and the nursing notes.   HISTORY  Chief Complaint Abdominal Pain; Chest Pain; and Nausea   HPI Lawrence [name] is a 59 y.o. male who tells me his stomach is been hurting all across it just under the chest and lower down. Patient reports the pain is severe. The pain is achy and crampy in nature worse after he eats or walks around. Patient reports he has been diagnosed with celiac disease and sees Dr. Thurmond Butts in critical clinic passes gastroenterologist. 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 Patient said he had 4 stools today which were not diarrheal. Patient reports she's gained over 25 pounds in the last several weeks. Patient had had a fecal impaction in the past and that was relieved. He has began gaining weight again however. He says his bowel movements are now normal.  Past Medical History  Diagnosis Date  . Hypertension   . Coronary artery disease   . COPD (chronic obstructive pulmonary disease)   . Chronic bronchitis   . Emphysema lung   . Diabetes mellitus without complication   . Asthma   . Celiac disease   . CHF (congestive heart failure)     There are no active problems to display for this patient.   Past Surgical History  Procedure Laterality Date  . Vascular surgery    . Bypass graft    . Coronary angioplasty with stent placement    . Tonsillectomy    . Cholecystectomy    . Esophageal dilation      Current Outpatient Rx  Name  Route  Sig  Dispense  Refill  . ALPRAZolam (XANAX) 1 MG tablet      TAKE 1/2-1 TABLET BY MOUTH 3 TIMES A DAY   90 tablet   3   . aspirin EC 81 MG tablet   Oral   Take 81 mg by mouth every morning.          Marland Kitchen atorvastatin (LIPITOR) 80 MG tablet   Oral   Take 1 tablet (80 mg total) by mouth at bedtime.   90 tablet   3   . buPROPion (WELLBUTRIN SR) 150 MG 12 hr tablet   Oral   Take 150 mg by mouth 2 (two) times daily.         . carvedilol (COREG) 12.5 MG tablet   Oral   Take 12.5 mg by mouth 2 (two) times daily.         . cephALEXin (KEFLEX) 500 MG capsule   Oral   Take 1 capsule (500 mg total) by mouth 3 (three) times daily.   21 capsule   0   . cetirizine (ZYRTEC) 10 MG tablet   Oral   Take 10 mg by mouth every morning.         . citalopram (CELEXA) 20 MG tablet   Oral   Take 20 mg by mouth daily.         . clopidogrel (PLAVIX) 75 MG tablet      TAKE ONE (1) TABLET EACH DAY   30 tablet   5   . esomeprazole (NEXIUM) 40 MG capsule   Oral   Take 40 mg by mouth every morning.         Marland Kitchen  Fluticasone-Salmeterol (ADVAIR) 500-50 MCG/DOSE AEPB   Inhalation   Inhale 1 puff into the lungs 2 (two) times daily.         . furosemide (LASIX) 40 MG tablet   Oral   Take 40 mg by mouth every morning.         . hyoscyamine (LEVSIN SL) 0.125 MG SL tablet   Sublingual   Place 0.125 mg under the tongue every 6 (six) hours.          . isosorbide mononitrate (IMDUR) 30 MG 24 hr tablet   Oral   Take 30 mg by mouth every morning.         . lactulose (CHRONULAC) 10 GM/15ML solution   Oral   Take 30 mLs (20 g total) by mouth daily as needed for mild constipation.   120 mL   0   . lansoprazole (PREVACID) 30 MG capsule   Oral   Take 30 mg by mouth every morning.         . levalbuterol (XOPENEX HFA) 45 MCG/ACT inhaler   Inhalation   Inhale 1-2 puffs into the lungs every 4 (four) hours as needed for shortness of breath.          . levofloxacin (LEVAQUIN) 750 MG tablet   Oral   Take 1 tablet (750 mg total) by mouth daily.   5 tablet   0   . lisinopril (PRINIVIL,ZESTRIL) 20 MG tablet   Oral   Take 20 mg by mouth daily.         Marland Kitchen  lisinopril-hydrochlorothiazide (PRINZIDE,ZESTORETIC) 20-12.5 MG per tablet   Oral   Take 1 tablet by mouth every morning.         . magnesium citrate SOLN   Oral   Take 296 mLs (1 Bottle total) by mouth once.   195 mL   0   . metFORMIN (GLUCOPHAGE) 500 MG tablet   Oral   Take 1 tablet (500 mg total) by mouth every morning.   90 tablet   4   . nitroGLYCERIN (NITROSTAT) 0.4 MG SL tablet   Sublingual   Place 1 tablet (0.4 mg total) under the tongue every 5 (five) minutes as needed for chest pain. Every 5 minutes as needed for chest pain   30 tablet   1   . omega-3 acid ethyl esters (LOVAZA) 1 G capsule   Oral   Take 4 g by mouth every morning.         Marland Kitchen oxyCODONE-acetaminophen (PERCOCET) 10-325 MG per tablet   Oral   Take 1 tablet by mouth every 4 (four) hours as needed for pain.   150 tablet   0   . polyethylene glycol (MIRALAX / GLYCOLAX) packet   Oral   Take 17 g by mouth daily.   14 each   0   . potassium chloride (K-DUR,KLOR-CON) 10 MEQ tablet   Oral   Take 10 mEq by mouth every morning.         . ranitidine (ZANTAC) 150 MG tablet   Oral   Take 300 mg by mouth 2 (two) times daily.         . tamsulosin (FLOMAX) 0.4 MG CAPS capsule   Oral   Take 0.4 mg by mouth daily.         Marland Kitchen tiotropium (SPIRIVA) 18 MCG inhalation capsule   Inhalation   Place 18 mcg into inhaler and inhale daily.           Allergies  Demerol and Sulfa antibiotics  No family history on file.  Social History History  Substance Use Topics  . Smoking status: Former Research scientist (life sciences)  . Smokeless tobacco: Not on file  . Alcohol Use: No    Review of Systems Constitutional: No fever/chills Eyes: No visual changes. ENT: No sore throat. Cardiovascular: Denies chest pain. Respiratory: Denies shortness of breath. Gastrointestinal: No abdominal pain.  No nausea, no vomiting.  No diarrhea.  No constipation. Genitourinary: Negative for dysuria. Musculoskeletal: Negative for back  pain. Skin: Negative for rash. Neurological: Negative for headaches, focal weakness or numbness.  10-point ROS otherwise negative.  ____________________________________________   PHYSICAL EXAM:  VITAL SIGNS: ED Triage Vitals  Enc Vitals Group     BP 05/25/15 1702 148/81 mmHg     Pulse Rate 05/25/15 1702 73     Resp 05/25/15 1702 20     Temp 05/25/15 1702 97.9 F (36.6 C)     Temp src --      SpO2 05/25/15 1702 94 %     Weight 05/25/15 1706 215 lb (97.523 kg)     Height 05/25/15 1706 5' 7"  (1.702 m)     Head Cir --      Peak Flow --      Pain Score 05/25/15 1706 10     Pain Loc --      Pain Edu? --      Excl. in [place]? --    **} Constitutional: Alert and oriented. Well appearing and in no acute distress. Eyes: Conjunctivae are normal. PERRL. EOMI. Head: Atraumatic. Nose: No congestion/rhinnorhea. Mouth/Throat: Mucous membranes are moist.  Oropharynx non-erythematous. Neck: No stridor. Cardiovascular: Normal rate, regular rhythm. Grossly normal heart sounds.  Good peripheral circulation. Respiratory: Normal respiratory effort.  No retractions. Lungs CTAB. Gastrointestinal: Soft but tender to palpation and percussion. This is especially true in the upper abdomen.. The patient reports his abdomen has gotten much bigger in the last few days.. No abdominal bruits bowel sounds are also somewhat decreased. No CVA tenderness. Musculoskeletal: No lower extremity tenderness . Patient does have some edema in his legs.  No joint effusions. Neurologic:  Normal speech and language. No gross focal neurologic deficits are appreciated. Speech is normal. No gait instability. Skin:  Skin is warm, dry and intact. No rash noted. Psychiatric: Mood and affect are normal. Speech and behavior are normal.  ____________________________________________   LABS (all labs ordered are listed, but only abnormal results are displayed)  Labs Reviewed  URINALYSIS COMPLETEWITH MICROSCOPIC (Ray) -  Abnormal; Notable for the following:    Color, Urine YELLOW (*)    APPearance CLEAR (*)    Squamous Epithelial / LPF 0-5 (*)    All other components within normal limits  CBC  BASIC METABOLIC PANEL  TROPONIN I  LIPASE, BLOOD  HEPATIC FUNCTION PANEL   ____________________________________________  EKG  EKG read and interpreted by me shows normal sinus rhythm with a rate of 75 normal axis no acute EKG changes ____________________________________________  RADIOLOGY  CT of the abdomen with contrast was read as essentially normal by the radiologist with the exception of a pneumonia patient has been treated with antibiotics for this pneumonia is not running a fever is not short of breath has no white count I also discussed with the radiologist thought was a funny-looking appearance of the aorta series to images 2326 he feels that this is volume averaging from the plaque and reviewed it again with me ____________________________________________   PROCEDURES    ____________________________________________  INITIAL IMPRESSION / ASSESSMENT AND PLAN / ED COURSE  Pertinent labs & imaging results that were available during my care of the patient were reviewed by me and considered in my medical decision making (see chart for details).  Reviewed the results of the CT and the lab work with Dr. Burt Knack the surgeon who feels as I do that there is no acute problem that needs to be addressed at this point patient will follow-up with his gastroenterologist and his other doctors to ensure that he is not having any other issues I will give the patient one more Percocet tonight and for more to go home with he understands it this may make him constipated which would be counterproductive and therefore will not give him any more pain pills. He also understands the fact that continued use of pain medicine will only lower his pain threshold ____________________________________________   FINAL CLINICAL  IMPRESSION(S) / ED DIAGNOSES  Final diagnoses:  Pain of upper abdomen      Nena Polio, MD 05/25/15 2148

## 2015-05-25 NOTE — ED Notes (Addendum)
Pt. Arrived to ed from home with reports of experiencing abdominal pain that stated yesterday and chest pain that started today. Pt reports hx of abdominal pain. Pt alert and oriented. Reports nausea. Pt alert and oriented. Pt reports increase SOB.

## 2015-05-25 NOTE — Discharge Instructions (Signed)
Abdominal Pain Many things can cause abdominal pain. Usually, abdominal pain is not caused by a disease and will improve without treatment. It can often be observed and treated at home. Your health care provider will do a physical exam and possibly order blood tests and X-rays to help determine the seriousness of your pain. However, in many cases, more time must pass before a clear cause of the pain can be found. Before that point, your health care provider may not know if you need more testing or further treatment. HOME CARE INSTRUCTIONS  Monitor your abdominal pain for any changes. The following actions may help to alleviate any discomfort you are experiencing:  Only take over-the-counter or prescription medicines as directed by your health care provider.  Do not take laxatives unless directed to do so by your health care provider.  Try a clear liquid diet (broth, tea, or water) as directed by your health care provider. Slowly move to a bland diet as tolerated. SEEK MEDICAL CARE IF:  You have unexplained abdominal pain.  You have abdominal pain associated with nausea or diarrhea.  You have pain when you urinate or have a bowel movement.  You experience abdominal pain that wakes you in the night.  You have abdominal pain that is worsened or improved by eating food.  You have abdominal pain that is worsened with eating fatty foods.  You have a fever. SEEK IMMEDIATE MEDICAL CARE IF:   Your pain does not go away within 2 hours.  You keep throwing up (vomiting).  Your pain is felt only in portions of the abdomen, such as the right side or the left lower portion of the abdomen.  You pass bloody or black tarry stools. MAKE SURE YOU:  Understand these instructions.   Will watch your condition.   Will get help right away if you are not doing well or get worse.  Document Released: 08/12/2005 Document Revised: 11/07/2013 Document Reviewed: 07/12/2013 [facility] Patient Information  2015 [place], [place]. This information is not intended to replace advice given to you by your health care provider. Make sure you discuss any questions you have with your health care provider.  Please follow up with your Family Doctor and your Gastroenterologist. Please return for fever, vomiting or worse pain or if you get blood int he stool or dark tarry stools.

## 2015-05-26 ENCOUNTER — Emergency Department
Admission: EM | Admit: 2015-05-26 | Discharge: 2015-05-26 | Disposition: A | Discharge status: Home / Self Care | Payer: Medicare PPO | Attending: Student | Admitting: Student

## 2015-05-26 DIAGNOSIS — I10 Essential (primary) hypertension: Secondary | ICD-10-CM | POA: Diagnosis not present

## 2015-05-26 DIAGNOSIS — Z792 Long term (current) use of antibiotics: Secondary | ICD-10-CM | POA: Diagnosis not present

## 2015-05-26 DIAGNOSIS — G8929 Other chronic pain: Secondary | ICD-10-CM | POA: Diagnosis not present

## 2015-05-26 DIAGNOSIS — Z7951 Long term (current) use of inhaled steroids: Secondary | ICD-10-CM | POA: Insufficient documentation

## 2015-05-26 DIAGNOSIS — Z87891 Personal history of nicotine dependence: Secondary | ICD-10-CM | POA: Insufficient documentation

## 2015-05-26 DIAGNOSIS — Z79899 Other long term (current) drug therapy: Secondary | ICD-10-CM | POA: Insufficient documentation

## 2015-05-26 DIAGNOSIS — Z7902 Long term (current) use of antithrombotics/antiplatelets: Secondary | ICD-10-CM | POA: Insufficient documentation

## 2015-05-26 DIAGNOSIS — Z7982 Long term (current) use of aspirin: Secondary | ICD-10-CM | POA: Diagnosis not present

## 2015-05-26 DIAGNOSIS — R1084 Generalized abdominal pain: Secondary | ICD-10-CM | POA: Diagnosis not present

## 2015-05-26 DIAGNOSIS — R109 Unspecified abdominal pain: Secondary | ICD-10-CM | POA: Diagnosis present

## 2015-05-26 DIAGNOSIS — J189 Pneumonia, unspecified organism: Secondary | ICD-10-CM

## 2015-05-26 DIAGNOSIS — J159 Unspecified bacterial pneumonia: Secondary | ICD-10-CM | POA: Insufficient documentation

## 2015-05-26 DIAGNOSIS — E119 Type 2 diabetes mellitus without complications: Secondary | ICD-10-CM | POA: Diagnosis not present

## 2015-05-26 LAB — TROPONIN I: Troponin I: <0.03 ng/mL (ref <0.031)

## 2015-05-26 LAB — CBC WITH DIFFERENTIAL/PLATELET
Basophils Absolute: 0 10*3/uL (ref 0–0.1)
Basophils Relative: 1 %
Eosinophils Absolute: 0.3 10*3/uL (ref 0–0.7)
Eosinophils Relative: 5 %
HCT: 47 % (ref 40.0–52.0)
Hemoglobin: 15.3 g/dL (ref 13.0–18.0)
Lymphocytes Relative: 30 %
Lymphs Abs: 1.7 10*3/uL (ref 1.0–3.6)
MCH: 29.2 pg (ref 26.0–34.0)
MCHC: 32.6 g/dL (ref 32.0–36.0)
MCV: 89.6 fL (ref 80.0–100.0)
Monocytes Absolute: 0.4 10*3/uL (ref 0.2–1.0)
Monocytes Relative: 8 %
Neutro Abs: 3.2 10*3/uL (ref 1.4–6.5)
Neutrophils Relative %: 56 %
Platelets: 213 10*3/uL (ref 150–440)
RBC: 5.24 MIL/uL (ref 4.40–5.90)
RDW: 14.6 % — ABNORMAL HIGH (ref 11.5–14.5)
WBC: 5.7 10*3/uL (ref 3.8–10.6)

## 2015-05-26 LAB — COMPREHENSIVE METABOLIC PANEL
ALT: 16 U/L — ABNORMAL LOW (ref 17–63)
AST: 22 U/L (ref 15–41)
Albumin: 3.6 g/dL (ref 3.5–5.0)
Alkaline Phosphatase: 53 U/L (ref 38–126)
Anion gap: 8 (ref 5–15)
BUN: 8 mg/dL (ref 6–20)
CO2: 27 mmol/L (ref 22–32)
Calcium: 8.7 mg/dL — ABNORMAL LOW (ref 8.9–10.3)
Chloride: 104 mmol/L (ref 101–111)
Creatinine, Ser: 0.93 mg/dL (ref 0.61–1.24)
GFR calc Af Amer: >60 mL/min (ref >60)
GFR calc non Af Amer: >60 mL/min (ref >60)
Glucose, Bld: 110 mg/dL — ABNORMAL HIGH (ref 65–99)
Potassium: 4 mmol/L (ref 3.5–5.1)
Sodium: 139 mmol/L (ref 135–145)
Total Bilirubin: 0.5 mg/dL (ref 0.3–1.2)
Total Protein: 6.6 g/dL (ref 6.5–8.1)

## 2015-05-26 MED ORDER — AZITHROMYCIN 250 MG PO TABS: ORAL_TABLET | ORAL | Status: AC

## 2015-05-26 NOTE — ED Notes (Signed)
Pt from home c/o abd pain. Seen here yesterday w/ neg CT abd per pt report. States ongoing abd issues for apx 7 weeks. Had EGD and colonoscopy within that time.

## 2015-05-26 NOTE — ED Notes (Signed)
This RN and MD at bedside.

## 2015-05-26 NOTE — ED Provider Notes (Signed)
[facility] Emergency Department Provider Note  ____________________________________________  Time seen: Approximately 9:18 PM  I have reviewed the triage vital signs and the nursing notes.   HISTORY  Chief Complaint Abdominal Pain    HPI Lawrence [name] is a 59 y.o. male  male history of hypertension, coronary artery disease status post stent and CABG, COPD who presents for evaluation of 5 weeks constant diffuse abdominal pain, gradual onset. The patient was seen by me in late June for similar problem which was thought to be related to constipation after CTA of the abdomen and pelvis was negative/unrevealing for a cause of his pain at [facility]. Since I saw him in late June, he has been seen here several times for continued abdominal pain with instructions to follow-up with his GI doctor. He was seen here on 05/17/2015 with negative CT of the abdomen and pelvis and was also evaluated last night for similar complaints with CT of the abdomen and pelvis which was again unrevealing for the cause of his pain although right basilar pneumonia was noted. He reports his pain is ongoing and typically only resolves with his pain medications which he is out of. Current severity is moderate to severe. No modifying factors. He is complaining of vomiting, having normal bowel movements, no fevers. No chest pain or shortness of breath.   Past Medical History  Diagnosis Date  . Hypertension   . Coronary artery disease   . COPD (chronic obstructive pulmonary disease)   . Chronic bronchitis   . Emphysema lung   . Diabetes mellitus without complication   . Asthma   . Celiac disease   . CHF (congestive heart failure)     There are no active problems to display for this patient.   Past Surgical History  Procedure Laterality Date  . Vascular surgery    . Bypass graft    . Coronary angioplasty with stent placement    . Tonsillectomy    . Cholecystectomy    . Esophageal dilation       Current Outpatient Rx  Name  Route  Sig  Dispense  Refill  . ALPRAZolam (XANAX) 1 MG tablet      TAKE 1/2-1 TABLET BY MOUTH 3 TIMES A DAY   90 tablet   3   . aspirin EC 81 MG tablet   Oral   Take 81 mg by mouth every morning.         Marland Kitchen atorvastatin (LIPITOR) 80 MG tablet   Oral   Take 1 tablet (80 mg total) by mouth at bedtime.   90 tablet   3   . buPROPion (WELLBUTRIN SR) 150 MG 12 hr tablet   Oral   Take 150 mg by mouth 2 (two) times daily.         . carvedilol (COREG) 12.5 MG tablet   Oral   Take 12.5 mg by mouth 2 (two) times daily.         . cephALEXin (KEFLEX) 500 MG capsule   Oral   Take 1 capsule (500 mg total) by mouth 3 (three) times daily.   21 capsule   0   . cetirizine (ZYRTEC) 10 MG tablet   Oral   Take 10 mg by mouth every morning.         . citalopram (CELEXA) 20 MG tablet   Oral   Take 20 mg by mouth daily.         . clopidogrel (PLAVIX) 75 MG tablet  TAKE ONE (1) TABLET EACH DAY   30 tablet   5   . esomeprazole (NEXIUM) 40 MG capsule   Oral   Take 40 mg by mouth every morning.         . Fluticasone-Salmeterol (ADVAIR) 500-50 MCG/DOSE AEPB   Inhalation   Inhale 1 puff into the lungs 2 (two) times daily.         . furosemide (LASIX) 40 MG tablet   Oral   Take 40 mg by mouth every morning.         . hyoscyamine (LEVSIN SL) 0.125 MG SL tablet   Sublingual   Place 0.125 mg under the tongue every 6 (six) hours.          . isosorbide mononitrate (IMDUR) 30 MG 24 hr tablet   Oral   Take 30 mg by mouth every morning.         . lactulose (CHRONULAC) 10 GM/15ML solution   Oral   Take 30 mLs (20 g total) by mouth daily as needed for mild constipation.   120 mL   0   . lansoprazole (PREVACID) 30 MG capsule   Oral   Take 30 mg by mouth every morning.         . levalbuterol (XOPENEX HFA) 45 MCG/ACT inhaler   Inhalation   Inhale 1-2 puffs into the lungs every 4 (four) hours as needed for shortness of  breath.          . levofloxacin (LEVAQUIN) 750 MG tablet   Oral   Take 1 tablet (750 mg total) by mouth daily.   5 tablet   0   . lisinopril (PRINIVIL,ZESTRIL) 20 MG tablet   Oral   Take 20 mg by mouth daily.         Marland Kitchen lisinopril-hydrochlorothiazide (PRINZIDE,ZESTORETIC) 20-12.5 MG per tablet   Oral   Take 1 tablet by mouth every morning.         . magnesium citrate SOLN   Oral   Take 296 mLs (1 Bottle total) by mouth once.   195 mL   0   . metFORMIN (GLUCOPHAGE) 500 MG tablet   Oral   Take 1 tablet (500 mg total) by mouth every morning.   90 tablet   4   . nitroGLYCERIN (NITROSTAT) 0.4 MG SL tablet   Sublingual   Place 1 tablet (0.4 mg total) under the tongue every 5 (five) minutes as needed for chest pain. Every 5 minutes as needed for chest pain   30 tablet   1   . omega-3 acid ethyl esters (LOVAZA) 1 G capsule   Oral   Take 4 g by mouth every morning.         Marland Kitchen oxyCODONE-acetaminophen (PERCOCET) 10-325 MG per tablet   Oral   Take 1 tablet by mouth every 4 (four) hours as needed for pain.   150 tablet   0   . polyethylene glycol (MIRALAX / GLYCOLAX) packet   Oral   Take 17 g by mouth daily.   14 each   0   . potassium chloride (K-DUR,KLOR-CON) 10 MEQ tablet   Oral   Take 10 mEq by mouth every morning.         . ranitidine (ZANTAC) 150 MG tablet   Oral   Take 300 mg by mouth 2 (two) times daily.         . tamsulosin (FLOMAX) 0.4 MG CAPS capsule   Oral   Take 0.4 mg  by mouth daily.         Marland Kitchen tiotropium (SPIRIVA) 18 MCG inhalation capsule   Inhalation   Place 18 mcg into inhaler and inhale daily.           Allergies Demerol and Sulfa antibiotics  History reviewed. No pertinent family history.  Social History History  Substance Use Topics  . Smoking status: Former Research scientist (life sciences)  . Smokeless tobacco: Not on file  . Alcohol Use: No    Review of Systems Constitutional: No fever/chills Eyes: No visual changes. ENT: No sore  throat. Cardiovascular: Denies chest pain. Respiratory: Denies shortness of breath. Gastrointestinal: + abdominal pain.  No nausea, + vomiting.  No diarrhea.  No constipation. Genitourinary: Negative for dysuria. Musculoskeletal: Negative for back pain. Skin: Negative for rash. Neurological: Negative for headaches, focal weakness or numbness.  10-point ROS otherwise negative.  ____________________________________________   PHYSICAL EXAM:  VITAL SIGNS: ED Triage Vitals  Enc Vitals Group     BP 05/26/15 1835 142/84 mmHg     Pulse Rate 05/26/15 1835 65     Resp 05/26/15 1835 16     Temp 05/26/15 1835 98.4 F (36.9 C)     Temp Source 05/26/15 1835 Oral     SpO2 05/26/15 1835 92 %     Weight 05/26/15 1835 215 lb (97.523 kg)     Height 05/26/15 1835 5' 7"  (1.702 m)     Head Cir --      Peak Flow --      Pain Score 05/26/15 1835 10     Pain Loc --      Pain Edu? --      Excl. in [place]? --     Constitutional: Alert and oriented. Chronically ill-appearing and in no acute distress. Eyes: Conjunctivae are normal. PERRL. EOMI. Head: Atraumatic. Nose: No congestion/rhinnorhea. Mouth/Throat: Mucous membranes are moist.  Oropharynx non-erythematous. Neck: No stridor.  }Cardiovascular: Normal rate, regular rhythm. Grossly normal heart sounds.  Good peripheral circulation. Respiratory: Normal respiratory effort.  No retractions. Lungs CTAB. Gastrointestinal: Normal bowel sounds, mild diffuse tenderness. No distention. No abdominal bruits. No CVA tenderness. Genitourinary: deferred Musculoskeletal: No lower extremity tenderness nor edema.  No joint effusions. Neurologic:  Normal speech and language. No gross focal neurologic deficits are appreciated. Speech is normal. No gait instability. Skin:  Skin is warm, dry and intact. No rash noted. Psychiatric: Mood and affect are normal. Speech and behavior are normal.  ____________________________________________   LABS (all labs ordered  are listed, but only abnormal results are displayed)  Labs Reviewed  CBC WITH DIFFERENTIAL/PLATELET - Abnormal; Notable for the following:    RDW 14.6 (*)    All other components within normal limits  COMPREHENSIVE METABOLIC PANEL - Abnormal; Notable for the following:    Glucose, Bld 110 (*)    Calcium 8.7 (*)    ALT 16 (*)    All other components within normal limits  TROPONIN I   ____________________________________________  EKG  ED ECG REPORT I, Joanne Gavel, the attending physician, personally viewed and interpreted this ECG.   Date: 05/26/2015  EKG Time: 18:36  Rate: 66  Rhythm: normal sinus rhythm  Axis: normal  Intervals:none  ST&T Change: No acute ST segment elevation, Q waves in lead 3, V3  EKG unchanged from prior ____________________________________________  RADIOLOGY  none ____________________________________________   PROCEDURES  Procedure(s) performed: None  Critical Care performed: No  ____________________________________________   INITIAL IMPRESSION / ASSESSMENT AND PLAN / ED COURSE  Pertinent labs &  imaging results that were available during my care of the patient were reviewed by me and considered in my medical decision making (see chart for details).  Lawrence [name] is a 59 y.o. male  male history of hypertension, coronary artery disease status post stent and CABG, COPD who presents for evaluation of 5 weeks constant diffuse abdominal pain, gradual onset. On exam, he is chronically ill-appearing but in no acute distress. Mildly bradycardic intermittently but on chart review that appears to be his baseline. O2 sats ranging from 92-97%,, currently 97% without any need for supplemental oxygen. He is not tachypneic, has no increased work of breathing and there is currently no indication for admission for pneumonia was noted on his CT scan yesterday. We'll discharge with azithromycin for pneumonia which was noted on CT scan yesterday, though this  is unlikely to be the cause of his chronic diffuse abdominal pain. I discussed that at this point he has had extensive workups in the emergency department for his chronic pain and he needs to follow-up with his primary care doctor and GI doctor as we have discussed previously. I also discussed hospital policy that we are not able to treat chronic pain with narcotic pain medications. I asked him if there were any other/nonnarcotic pain medications that I could give him that would be helpful, he said no. Discussed with him that he needs to speak with his GI doctor and his primary care doctor tomorrow for additional outpatient workup/discussions about chronic pain management. He voices understanding of this. Reviewed labs which are generally unremarkable. Troponin negative. We'll DC home with return precautions.  ____________________________________________   FINAL CLINICAL IMPRESSION(S) / ED DIAGNOSES  Final diagnoses:  Chronic generalized abdominal pain  Community acquired pneumonia      Joanne Gavel, MD 05/27/15 [phone]

## 2015-06-03 ENCOUNTER — Encounter: Payer: Self-pay | Admitting: Emergency Medicine

## 2015-06-03 ENCOUNTER — Emergency Department: Payer: Medicare PPO

## 2015-06-03 ENCOUNTER — Other Ambulatory Visit: Payer: Self-pay | Admitting: Family Medicine

## 2015-06-03 ENCOUNTER — Emergency Department
Admission: EM | Admit: 2015-06-03 | Discharge: 2015-06-03 | Disposition: A | Discharge status: Home / Self Care | Payer: Medicare PPO | Attending: Emergency Medicine | Admitting: Emergency Medicine

## 2015-06-03 DIAGNOSIS — X58XXXA Exposure to other specified factors, initial encounter: Secondary | ICD-10-CM | POA: Diagnosis not present

## 2015-06-03 DIAGNOSIS — M25562 Pain in left knee: Secondary | ICD-10-CM

## 2015-06-03 DIAGNOSIS — S8992XA Unspecified injury of left lower leg, initial encounter: Secondary | ICD-10-CM | POA: Insufficient documentation

## 2015-06-03 DIAGNOSIS — Z7902 Long term (current) use of antithrombotics/antiplatelets: Secondary | ICD-10-CM | POA: Diagnosis not present

## 2015-06-03 DIAGNOSIS — Y9289 Other specified places as the place of occurrence of the external cause: Secondary | ICD-10-CM | POA: Diagnosis not present

## 2015-06-03 DIAGNOSIS — Z87891 Personal history of nicotine dependence: Secondary | ICD-10-CM | POA: Diagnosis not present

## 2015-06-03 DIAGNOSIS — Z79899 Other long term (current) drug therapy: Secondary | ICD-10-CM | POA: Diagnosis not present

## 2015-06-03 DIAGNOSIS — E119 Type 2 diabetes mellitus without complications: Secondary | ICD-10-CM | POA: Insufficient documentation

## 2015-06-03 DIAGNOSIS — Y9301 Activity, walking, marching and hiking: Secondary | ICD-10-CM | POA: Diagnosis not present

## 2015-06-03 DIAGNOSIS — I10 Essential (primary) hypertension: Secondary | ICD-10-CM | POA: Insufficient documentation

## 2015-06-03 DIAGNOSIS — Z7982 Long term (current) use of aspirin: Secondary | ICD-10-CM | POA: Diagnosis not present

## 2015-06-03 DIAGNOSIS — Y998 Other external cause status: Secondary | ICD-10-CM | POA: Diagnosis not present

## 2015-06-03 DIAGNOSIS — Z7951 Long term (current) use of inhaled steroids: Secondary | ICD-10-CM | POA: Insufficient documentation

## 2015-06-03 MED ORDER — MELOXICAM 7.5 MG PO TABS
7.5000 mg | ORAL_TABLET | Freq: Every day | ORAL | Status: DC
Start: 2015-06-03 — End: 2015-08-01

## 2015-06-03 MED ORDER — KETOROLAC TROMETHAMINE 60 MG/2ML IM SOLN
60.0000 mg | Freq: Once | INTRAMUSCULAR | Status: AC
  Administered 2015-06-03: 60 mg via INTRAMUSCULAR
  Filled 2015-06-03: qty 2

## 2015-06-03 NOTE — ED Notes (Addendum)
Pt to triage via w/c with no distress noted; pt st left knee "popped" last night with no known injury; st was standing, turned and felt "pop"; denies hx of same; st pain and swelling persists

## 2015-06-03 NOTE — Telephone Encounter (Signed)
oxyCODONE-acetaminophen (PERCOCET) 10-325 MG per tablet Pt needs to pick up refill.  Thanks TP

## 2015-06-03 NOTE — ED Provider Notes (Signed)
[facility] Emergency Department Provider Note  ____________________________________________  Time seen: Approximately 9:35 PM  I have reviewed the triage vital signs and the nursing notes.   HISTORY  Chief Complaint Knee Pain  Spouse at bedside.   HPI Lawrence [name] is a 59 y.o. male presents to the ER for complaints of left knee pain. Patient reports that last night he was walking and then twisted knee and states heard and felt a pop in his left knee. Patient reports that at onset of pop he had onset of pain and left knee that has persisted. Patient reports he is able to weight-bear but with pain. Patient states that knee feels like it's going to "give out". Denies fall. Denies head injury or loss consciousness. Denies other pain or injury.  Patient states the pain is primarily to lateral and medial left knee. Patient states that pain is currently "10 out of 10". Patient reports that he takes Percocet 10 mg tablets 3-4 times a day as needed for chronic back pain. Denies pain radiation. Denies numbness or tingling sensation. Denies calf pain. Denies upper or lower leg pain. Denies She also reports that he was recently seen and evaluated in the ER for chronic abdominal pain as well as pneumonia. Patient reports that abdominal pain and pneumonia is much improved and feeling better. Denies fevers. Denies chest pain or shortness of breath.    Past Medical History  Diagnosis Date  . Hypertension   . Coronary artery disease   . COPD (chronic obstructive pulmonary disease)   . Chronic bronchitis   . Emphysema lung   . Diabetes mellitus without complication   . Asthma   . Celiac disease   . CHF (congestive heart failure)     There are no active problems to display for this patient.   Past Surgical History  Procedure Laterality Date  . Vascular surgery    . Bypass graft    . Coronary angioplasty with stent placement    . Tonsillectomy    . Cholecystectomy     . Esophageal dilation      Current Outpatient Rx  Name  Route  Sig  Dispense  Refill  . ALPRAZolam (XANAX) 1 MG tablet      TAKE 1/2-1 TABLET BY MOUTH 3 TIMES A DAY   90 tablet   3   . aspirin EC 81 MG tablet   Oral   Take 81 mg by mouth every morning.         Marland Kitchen atorvastatin (LIPITOR) 80 MG tablet   Oral   Take 1 tablet (80 mg total) by mouth at bedtime.   90 tablet   3   . buPROPion (WELLBUTRIN SR) 150 MG 12 hr tablet   Oral   Take 150 mg by mouth 2 (two) times daily.         . carvedilol (COREG) 12.5 MG tablet   Oral   Take 12.5 mg by mouth 2 (two) times daily.         . cetirizine (ZYRTEC) 10 MG tablet   Oral   Take 10 mg by mouth every morning.         . citalopram (CELEXA) 20 MG tablet   Oral   Take 20 mg by mouth daily.         . clopidogrel (PLAVIX) 75 MG tablet      TAKE ONE (1) TABLET EACH DAY   30 tablet   5   . esomeprazole (NEXIUM)  40 MG capsule   Oral   Take 40 mg by mouth every morning.         . Fluticasone-Salmeterol (ADVAIR) 500-50 MCG/DOSE AEPB   Inhalation   Inhale 1 puff into the lungs 2 (two) times daily.         . furosemide (LASIX) 40 MG tablet   Oral   Take 40 mg by mouth every morning.         . hyoscyamine (LEVSIN SL) 0.125 MG SL tablet   Sublingual   Place 0.125 mg under the tongue every 6 (six) hours.          . isosorbide mononitrate (IMDUR) 30 MG 24 hr tablet   Oral   Take 30 mg by mouth every morning.         . lactulose (CHRONULAC) 10 GM/15ML solution   Oral   Take 30 mLs (20 g total) by mouth daily as needed for mild constipation.   120 mL   0   . lansoprazole (PREVACID) 30 MG capsule   Oral   Take 30 mg by mouth every morning.         . levalbuterol (XOPENEX HFA) 45 MCG/ACT inhaler   Inhalation   Inhale 1-2 puffs into the lungs every 4 (four) hours as needed for shortness of breath.          . levofloxacin (LEVAQUIN) 750 MG tablet   Oral   Take 1 tablet (750 mg total) by mouth  daily.   5 tablet   0   . lisinopril (PRINIVIL,ZESTRIL) 20 MG tablet   Oral   Take 20 mg by mouth daily.         Marland Kitchen lisinopril-hydrochlorothiazide (PRINZIDE,ZESTORETIC) 20-12.5 MG per tablet   Oral   Take 1 tablet by mouth every morning.         . magnesium citrate SOLN   Oral   Take 296 mLs (1 Bottle total) by mouth once.   195 mL   0   . meloxicam (MOBIC) 7.5 MG tablet   Oral   Take 1 tablet (7.5 mg total) by mouth daily.   10 tablet   0   . metFORMIN (GLUCOPHAGE) 500 MG tablet   Oral   Take 1 tablet (500 mg total) by mouth every morning.   90 tablet   4   . nitroGLYCERIN (NITROSTAT) 0.4 MG SL tablet   Sublingual   Place 1 tablet (0.4 mg total) under the tongue every 5 (five) minutes as needed for chest pain. Every 5 minutes as needed for chest pain   30 tablet   1   . omega-3 acid ethyl esters (LOVAZA) 1 G capsule   Oral   Take 4 g by mouth every morning.         Marland Kitchen oxyCODONE-acetaminophen (PERCOCET) 10-325 MG per tablet   Oral   Take 1 tablet by mouth every 4 (four) hours as needed for pain.   150 tablet   0   . polyethylene glycol (MIRALAX / GLYCOLAX) packet   Oral   Take 17 g by mouth daily.   14 each   0   . potassium chloride (K-DUR,KLOR-CON) 10 MEQ tablet   Oral   Take 10 mEq by mouth every morning.         . ranitidine (ZANTAC) 150 MG tablet   Oral   Take 300 mg by mouth 2 (two) times daily.         . tamsulosin (FLOMAX) 0.4  MG CAPS capsule   Oral   Take 0.4 mg by mouth daily.         Marland Kitchen tiotropium (SPIRIVA) 18 MCG inhalation capsule   Inhalation   Place 18 mcg into inhaler and inhale daily.           Allergies Demerol and Sulfa antibiotics  No family history on file.  Social History History  Substance Use Topics  . Smoking status: Former Research scientist (life sciences)  . Smokeless tobacco: Not on file  . Alcohol Use: No    Review of Systems Constitutional: No fever/chills Eyes: No visual changes. ENT: No sore throat. Cardiovascular:  Denies chest pain. Respiratory: Denies shortness of breath. Gastrointestinal: No abdominal pain.  No nausea, no vomiting.  No diarrhea.  No constipation. Genitourinary: Negative for dysuria. Musculoskeletal: Positive for chronic neck and back pain. Denies changes in neck or back pain. Positive for left knee pain. Skin: Negative for rash. Neurological: Negative for headaches, focal weakness or numbness.  10-point ROS otherwise negative.  ____________________________________________   PHYSICAL EXAM:  VITAL SIGNS: ED Triage Vitals  Enc Vitals Group     BP 06/03/15 1933 148/81 mmHg     Pulse Rate 06/03/15 1933 64     Resp 06/03/15 1933 20     Temp 06/03/15 1933 97.7 F (36.5 C)     Temp Source 06/03/15 1933 Oral     SpO2 06/03/15 1933 96 %     Weight 06/03/15 1933 221 lb (100.245 kg)     Height 06/03/15 1933 5' 7"  (1.702 m)     Head Cir --      Peak Flow --      Pain Score 06/03/15 1933 10     Pain Loc --      Pain Edu? --      Excl. in [place]? --     Constitutional: Alert and oriented. Well appearing and in no acute distress. Eyes: Conjunctivae are normal. PERRL. EOMI. Head: Atraumatic. Nose: No congestion/rhinnorhea. Mouth/Throat: Mucous membranes are moist.  Oropharynx non-erythematous. Neck: No stridor.  No cervical spine tenderness to palpation. Hematological/Lymphatic/Immunilogical: No cervical lymphadenopathy. Cardiovascular: Normal rate, regular rhythm. Grossly normal heart sounds.  Good peripheral circulation. Respiratory: Normal respiratory effort.  No retractions. Lungs CTAB. Gastrointestinal: Soft and nontender. No distention. No abdominal bruits. No CVA tenderness. Musculoskeletal: No lower or upper extremity tenderness nor edema.  No joint effusions. Except: left knee mod TTP with medial and lateral stress, no pain with anterior or posterior drawer test. Left lower extremity nontender above and below left knee. Bilateral pedal pulses equal and easily palpated. Full  ROM to left lower extremity. Mod pain with left knee flexion. No pain with left plantar and dorsiflexion. No left calf tenderness. No signs of ischemia. Normal coloration. Sensation intact bilateral to lower legs.  Neurologic:  Normal speech and language. No gross focal neurologic deficits are appreciated. No gait instability. Skin:  Skin is warm, dry and intact. No rash noted. Psychiatric: Mood and affect are normal. Speech and behavior are normal.  ____________________________________________   LABS (all labs ordered are listed, but only abnormal results are displayed)  Labs Reviewed - No data to display  RADIOLOGY EXAM: LEFT KNEE - COMPLETE 4+ VIEW  COMPARISON: None.  FINDINGS: There is no evidence of fracture, dislocation. There is a small suprapatellar effusion. There is no evidence of arthropathy or other focal bone abnormality. Surgical clips are identified in the medial soft tissues.  IMPRESSION: No acute bony abnormality identified.   Electronically Signed  By: Abelardo Diesel M.D. On: 06/03/2015 20:01      I, Marylene Land, personally viewed and evaluated these images as part of my medical decision making.   ____________________________________________   PROCEDURES  Procedure(s) performed:  SPLINT APPLICATION Date/Time: 60:10 PM Authorized by: Marylene Land Consent: Verbal consent obtained. Risks and benefits: risks, benefits and alternatives were discussed Consent given by: patient Splint applied by: edtechnician Location details: left knee immobilizer and walker Post-procedure: The splinted body part was neurovascularly unchanged following the procedure. Patient tolerance: Patient tolerated the procedure well with no immediate complications.    ____________________________________________   INITIAL IMPRESSION / ASSESSMENT AND PLAN / ED COURSE  Pertinent labs & imaging results that were available during my care of the patient were reviewed  by me and considered in my medical decision making (see chart for details).  Very well-appearing patient. No acute distress. Presents the ER for complaints of left knee pain post twisting injury in her hearing a pop. Patient reports that pain sudden onset with twisting and pain has persisted since. Denies fall or other injury. Reports continues to ambulate but with pain and feeling that left knee is going to give out. Left knee x-ray no acute bony abnormality. Patient with pain present on a valgus and varus stress test. Concern for internal injury. Treat patient with walker, knee immobilizer and when necessary Mobic. Patient takes Percocet 10 3 times a day patient to continue home pain medication. Patient follow-up with orthopedic this week. Patient was understanding and agreed to plan. ____________________________________________   FINAL CLINICAL IMPRESSION(S) / ED DIAGNOSES  Final diagnoses:  Left knee pain      Marylene Land, NP 06/03/15 [id number]  Hinda Kehr, MD 06/04/15 0005

## 2015-06-03 NOTE — Discharge Instructions (Signed)
Take medication as prescribed. Continue home medication. Apply ice and rest. Wear knee immobilizer and use crutches.   Follow-up with her primary care physician as needed. Follow-up with orthopedics this week as needed for continued pain. Return to the ER for new or worsening concerns.  Knee Pain The knee is the complex joint between your thigh and your lower leg. It is made up of bones, tendons, ligaments, and cartilage. The bones that make up the knee are:  The femur in the thigh.  The tibia and fibula in the lower leg.  The patella or kneecap riding in the groove on the lower femur. CAUSES  Knee pain is a common complaint with many causes. A few of these causes are:  Injury, such as:  A ruptured ligament or tendon injury.  Torn cartilage.  Medical conditions, such as:  Gout  Arthritis  Infections  Overuse, over training, or overdoing a physical activity. Knee pain can be minor or severe. Knee pain can accompany debilitating injury. Minor knee problems often respond well to self-care measures or get well on their own. More serious injuries may need medical intervention or even surgery. SYMPTOMS The knee is complex. Symptoms of knee problems can vary widely. Some of the problems are:  Pain with movement and weight bearing.  Swelling and tenderness.  Buckling of the knee.  Inability to straighten or extend your knee.  Your knee locks and you cannot straighten it.  Warmth and redness with pain and fever.  Deformity or dislocation of the kneecap. DIAGNOSIS  Determining what is wrong may be very straight forward such as when there is an injury. It can also be challenging because of the complexity of the knee. Tests to make a diagnosis may include:  Your caregiver taking a history and doing a physical exam.  Routine X-rays can be used to rule out other problems. X-rays will not reveal a cartilage tear. Some injuries of the knee can be diagnosed by:  Arthroscopy a  surgical technique by which a small video camera is inserted through tiny incisions on the sides of the knee. This procedure is used to examine and repair internal knee joint problems. Tiny instruments can be used during arthroscopy to repair the torn knee cartilage (meniscus).  Arthrography is a radiology technique. A contrast liquid is directly injected into the knee joint. Internal structures of the knee joint then become visible on X-ray film.  An MRI scan is a non X-ray radiology procedure in which magnetic fields and a computer produce two- or three-dimensional images of the inside of the knee. Cartilage tears are often visible using an MRI scanner. MRI scans have largely replaced arthrography in diagnosing cartilage tears of the knee.  Blood work.  Examination of the fluid that helps to lubricate the knee joint (synovial fluid). This is done by taking a sample out using a needle and a syringe. TREATMENT The treatment of knee problems depends on the cause. Some of these treatments are:  Depending on the injury, proper casting, splinting, surgery, or physical therapy care will be needed.  Give yourself adequate recovery time. Do not overuse your joints. If you begin to get sore during workout routines, back off. Slow down or do fewer repetitions.  For repetitive activities such as cycling or running, maintain your strength and nutrition.  Alternate muscle groups. For example, if you are a weight lifter, work the upper body on one day and the lower body the next.  Either tight or weak muscles do  not give the proper support for your knee. Tight or weak muscles do not absorb the stress placed on the knee joint. Keep the muscles surrounding the knee strong.  Take care of mechanical problems.  If you have flat feet, orthotics or special shoes may help. See your caregiver if you need help.  Arch supports, sometimes with wedges on the inner or outer aspect of the heel, can help. These can  shift pressure away from the side of the knee most bothered by osteoarthritis.  A brace called an "unloader" brace also may be used to help ease the pressure on the most arthritic side of the knee.  If your caregiver has prescribed crutches, braces, wraps or ice, use as directed. The acronym for this is PRICE. This means protection, rest, ice, compression, and elevation.  Nonsteroidal anti-inflammatory drugs (NSAIDs), can help relieve pain. But if taken immediately after an injury, they may actually increase swelling. Take NSAIDs with food in your stomach. Stop them if you develop stomach problems. Do not take these if you have a history of ulcers, stomach pain, or bleeding from the bowel. Do not take without your caregiver's approval if you have problems with fluid retention, heart failure, or kidney problems.  For ongoing knee problems, physical therapy may be helpful.  Glucosamine and chondroitin are over-the-counter dietary supplements. Both may help relieve the pain of osteoarthritis in the knee. These medicines are different from the usual anti-inflammatory drugs. Glucosamine may decrease the rate of cartilage destruction.  Injections of a corticosteroid drug into your knee joint may help reduce the symptoms of an arthritis flare-up. They may provide pain relief that lasts a few months. You may have to wait a few months between injections. The injections do have a small increased risk of infection, water retention, and elevated blood sugar levels.  Hyaluronic acid injected into damaged joints may ease pain and provide lubrication. These injections may work by reducing inflammation. A series of shots may give relief for as long as 6 months.  Topical painkillers. Applying certain ointments to your skin may help relieve the pain and stiffness of osteoarthritis. Ask your pharmacist for suggestions. Many over the-counter products are approved for temporary relief of arthritis pain.  In some  countries, doctors often prescribe topical NSAIDs for relief of chronic conditions such as arthritis and tendinitis. A review of treatment with NSAID creams found that they worked as well as oral medications but without the serious side effects. PREVENTION  Maintain a healthy weight. Extra pounds put more strain on your joints.  Get strong, stay limber. Weak muscles are a common cause of knee injuries. Stretching is important. Include flexibility exercises in your workouts.  Be smart about exercise. If you have osteoarthritis, chronic knee pain or recurring injuries, you may need to change the way you exercise. This does not mean you have to stop being active. If your knees ache after jogging or playing basketball, consider switching to swimming, water aerobics, or other low-impact activities, at least for a few days a week. Sometimes limiting high-impact activities will provide relief.  Make sure your shoes fit well. Choose footwear that is right for your sport.  Protect your knees. Use the proper gear for knee-sensitive activities. Use kneepads when playing volleyball or laying carpet. Buckle your seat belt every time you drive. Most shattered kneecaps occur in car accidents.  Rest when you are tired. SEEK MEDICAL CARE IF:  You have knee pain that is continual and does not seem to  be getting better.  SEEK IMMEDIATE MEDICAL CARE IF:  Your knee joint feels hot to the touch and you have a high fever. MAKE SURE YOU:   Understand these instructions.  Will watch your condition.  Will get help right away if you are not doing well or get worse. Document Released: 08/30/2007 Document Revised: 01/25/2012 Document Reviewed: 08/30/2007 [facility] Patient Information 2015 [place], [place]. This information is not intended to replace advice given to you by your health care provider. Make sure you discuss any questions you have with your health care provider.

## 2015-06-04 MED ORDER — OXYCODONE-ACETAMINOPHEN 10-325 MG PO TABS: 1.0000 | ORAL_TABLET | ORAL | Status: DC | PRN

## 2015-06-09 ENCOUNTER — Encounter: Payer: Self-pay | Admitting: Emergency Medicine

## 2015-06-09 ENCOUNTER — Emergency Department
Admission: EM | Admit: 2015-06-09 | Discharge: 2015-06-09 | Disposition: A | Discharge status: Home / Self Care | Payer: Medicare PPO | Attending: Emergency Medicine | Admitting: Emergency Medicine

## 2015-06-09 DIAGNOSIS — Z87891 Personal history of nicotine dependence: Secondary | ICD-10-CM | POA: Diagnosis not present

## 2015-06-09 DIAGNOSIS — Z79899 Other long term (current) drug therapy: Secondary | ICD-10-CM | POA: Diagnosis not present

## 2015-06-09 DIAGNOSIS — I10 Essential (primary) hypertension: Secondary | ICD-10-CM | POA: Diagnosis not present

## 2015-06-09 DIAGNOSIS — Z792 Long term (current) use of antibiotics: Secondary | ICD-10-CM | POA: Insufficient documentation

## 2015-06-09 DIAGNOSIS — Z791 Long term (current) use of non-steroidal anti-inflammatories (NSAID): Secondary | ICD-10-CM | POA: Diagnosis not present

## 2015-06-09 DIAGNOSIS — Z7902 Long term (current) use of antithrombotics/antiplatelets: Secondary | ICD-10-CM | POA: Insufficient documentation

## 2015-06-09 DIAGNOSIS — M25562 Pain in left knee: Secondary | ICD-10-CM | POA: Insufficient documentation

## 2015-06-09 DIAGNOSIS — Z7951 Long term (current) use of inhaled steroids: Secondary | ICD-10-CM | POA: Insufficient documentation

## 2015-06-09 DIAGNOSIS — Z7982 Long term (current) use of aspirin: Secondary | ICD-10-CM | POA: Diagnosis not present

## 2015-06-09 NOTE — ED Notes (Signed)
Pt reports he was here July 18th after his knee "popped" pt wearing a knee immobilizer to left leg. Pt states at [facility] this week on Tuesday he was told he needs to have his leg "Cat scanned".  [facility] told him it was needed to further exam his knee. Pt states he is here today because the pain had gotten worse. Pain is located in left knee and left foot. Knee immobilizer removed Skin color WNL small about of swell to foot noted.

## 2015-06-09 NOTE — ED Provider Notes (Signed)
[facility] Emergency Department Provider Note  ____________________________________________  Time seen: Approximately 3:15 PM  I have reviewed the triage vital signs and the nursing notes.   HISTORY  Chief Complaint No chief complaint on file.  left knee pain   HPI Lawrence [name] is a 59 y.o. male patient complaining of continued knee pain since last visit 06/03/2015. Patient was evaluated and found to have a small effusion on the left knee was advised to follow with orthopedic clinic. Patient state they he decided to follow this orthopedic angry increased O and was told he could not have an appointment until August 28. Patient state his continue pain increase with weightbearing. Patient requested orthopedic come to the ER to evaluate his knee. Patient is rating his pain as a 9/10. Patient states the pain is not controlled with taking 10/325 hydrocodone. Patient states the knee immobilized and walker is not helping him. Patient describes his pain as sharp.  Past Medical History  Diagnosis Date  . Hypertension   . Coronary artery disease   . COPD (chronic obstructive pulmonary disease)   . Chronic bronchitis   . Emphysema lung   . Diabetes mellitus without complication   . Asthma   . Celiac disease   . CHF (congestive heart failure)     There are no active problems to display for this patient.   Past Surgical History  Procedure Laterality Date  . Vascular surgery    . Bypass graft    . Coronary angioplasty with stent placement    . Tonsillectomy    . Cholecystectomy    . Esophageal dilation      Current Outpatient Rx  Name  Route  Sig  Dispense  Refill  . ALPRAZolam (XANAX) 1 MG tablet      TAKE 1/2-1 TABLET BY MOUTH 3 TIMES A DAY   90 tablet   3   . aspirin EC 81 MG tablet   Oral   Take 81 mg by mouth every morning.         Marland Kitchen atorvastatin (LIPITOR) 80 MG tablet   Oral   Take 1 tablet (80 mg total) by mouth at bedtime.   90 tablet  3   . buPROPion (WELLBUTRIN SR) 150 MG 12 hr tablet   Oral   Take 150 mg by mouth 2 (two) times daily.         . carvedilol (COREG) 12.5 MG tablet   Oral   Take 12.5 mg by mouth 2 (two) times daily.         . cetirizine (ZYRTEC) 10 MG tablet   Oral   Take 10 mg by mouth every morning.         . citalopram (CELEXA) 20 MG tablet   Oral   Take 20 mg by mouth daily.         . clopidogrel (PLAVIX) 75 MG tablet      TAKE ONE (1) TABLET EACH DAY   30 tablet   5   . esomeprazole (NEXIUM) 40 MG capsule   Oral   Take 40 mg by mouth every morning.         . Fluticasone-Salmeterol (ADVAIR) 500-50 MCG/DOSE AEPB   Inhalation   Inhale 1 puff into the lungs 2 (two) times daily.         . furosemide (LASIX) 40 MG tablet   Oral   Take 40 mg by mouth every morning.         Marland Kitchen  hyoscyamine (LEVSIN SL) 0.125 MG SL tablet   Sublingual   Place 0.125 mg under the tongue every 6 (six) hours.          . isosorbide mononitrate (IMDUR) 30 MG 24 hr tablet   Oral   Take 30 mg by mouth every morning.         . lactulose (CHRONULAC) 10 GM/15ML solution   Oral   Take 30 mLs (20 g total) by mouth daily as needed for mild constipation.   120 mL   0   . lansoprazole (PREVACID) 30 MG capsule   Oral   Take 30 mg by mouth every morning.         . levalbuterol (XOPENEX HFA) 45 MCG/ACT inhaler   Inhalation   Inhale 1-2 puffs into the lungs every 4 (four) hours as needed for shortness of breath.          . levofloxacin (LEVAQUIN) 750 MG tablet   Oral   Take 1 tablet (750 mg total) by mouth daily.   5 tablet   0   . lisinopril (PRINIVIL,ZESTRIL) 20 MG tablet   Oral   Take 20 mg by mouth daily.         Marland Kitchen lisinopril-hydrochlorothiazide (PRINZIDE,ZESTORETIC) 20-12.5 MG per tablet   Oral   Take 1 tablet by mouth every morning.         . magnesium citrate SOLN   Oral   Take 296 mLs (1 Bottle total) by mouth once.   195 mL   0   . meloxicam (MOBIC) 7.5 MG tablet    Oral   Take 1 tablet (7.5 mg total) by mouth daily.   10 tablet   0   . metFORMIN (GLUCOPHAGE) 500 MG tablet   Oral   Take 1 tablet (500 mg total) by mouth every morning.   90 tablet   4   . nitroGLYCERIN (NITROSTAT) 0.4 MG SL tablet   Sublingual   Place 1 tablet (0.4 mg total) under the tongue every 5 (five) minutes as needed for chest pain. Every 5 minutes as needed for chest pain   30 tablet   1   . omega-3 acid ethyl esters (LOVAZA) 1 G capsule   Oral   Take 4 g by mouth every morning.         Marland Kitchen oxyCODONE-acetaminophen (PERCOCET) 10-325 MG per tablet   Oral   Take 1 tablet by mouth every 4 (four) hours as needed for pain.   150 tablet   0   . polyethylene glycol (MIRALAX / GLYCOLAX) packet   Oral   Take 17 g by mouth daily.   14 each   0   . potassium chloride (K-DUR,KLOR-CON) 10 MEQ tablet   Oral   Take 10 mEq by mouth every morning.         . ranitidine (ZANTAC) 150 MG tablet   Oral   Take 300 mg by mouth 2 (two) times daily.         . tamsulosin (FLOMAX) 0.4 MG CAPS capsule   Oral   Take 0.4 mg by mouth daily.         Marland Kitchen tiotropium (SPIRIVA) 18 MCG inhalation capsule   Inhalation   Place 18 mcg into inhaler and inhale daily.           Allergies Demerol and Sulfa antibiotics  History reviewed. No pertinent family history.  Social History History  Substance Use Topics  . Smoking status: Former Research scientist (life sciences)  .  Smokeless tobacco: Never Used  . Alcohol Use: No    Review of Systems Constitutional: No fever/chills Eyes: No visual changes. ENT: No sore throat. Cardiovascular: Denies chest pain. Respiratory: Denies shortness of breath. Gastrointestinal: No abdominal pain.  No nausea, no vomiting.  No diarrhea.  No constipation. Genitourinary: Negative for dysuria. Musculoskeletal: Negative for back pain. Left knee pain. Skin: Negative for rash. Neurological: Negative for headaches, focal weakness or numbness. 10-point ROS otherwise  negative.  ____________________________________________   PHYSICAL EXAM:  VITAL SIGNS: ED Triage Vitals  Enc Vitals Group     BP 06/09/15 1409 135/86 mmHg     Pulse Rate 06/09/15 1409 67     Resp 06/09/15 1409 18     Temp 06/09/15 1409 98.4 F (36.9 C)     Temp Source 06/09/15 1409 Oral     SpO2 06/09/15 1409 93 %     Weight 06/09/15 1409 212 lb (96.163 kg)     Height 06/09/15 1409 5' 7"  (1.702 m)     Head Cir --      Peak Flow --      Pain Score 06/09/15 1418 9     Pain Loc --      Pain Edu? --      Excl. in [place]? --     Constitutional: Alert and oriented. Well appearing and in no acute distress. Eyes: Conjunctivae are normal. PERRL. EOMI. Head: Atraumatic. Nose: No congestion/rhinnorhea. Mouth/Throat: Mucous membranes are moist.  Oropharynx non-erythematous. Neck: No stridor.  No cervical spine tenderness to palpation. Hematological/Lymphatic/Immunilogical: No cervical lymphadenopathy. Cardiovascular: Normal rate, regular rhythm. Grossly normal heart sounds.  Good peripheral circulation. Respiratory: Normal respiratory effort.  No retractions. Lungs CTAB. Gastrointestinal: Soft and nontender. No distention. No abdominal bruits. No CVA tenderness. Musculoskeletal: No obvious deformity of the left lower extremity. Neurovascular intact. Patient has some guarding palpation of the medial and lateral patella femoral joints lines. No crepitus or palpation. Neurologic:  Normal speech and language. No gross focal neurologic deficits are appreciated. No gait instability. Skin:  Skin is warm, dry and intact. No rash noted. Psychiatric: Mood and affect are normal. Speech and behavior are normal.  ____________________________________________   LABS (all labs ordered are listed, but only abnormal results are displayed)  Labs Reviewed - No data to display ____________________________________________  EKG   ____________________________________________  RADIOLOGY  Reviewed  x-ray from previous exam was unremarkable except for some small effusion. ____________________________________________   PROCEDURES  Procedure(s) performed: None  Critical Care performed: No  ____________________________________________   INITIAL IMPRESSION / ASSESSMENT AND PLAN / ED COURSE  Pertinent labs & imaging results that were available during my care of the patient were reviewed by me and considered in my medical decision making (see chart for details).  Left knee pain. Patient advised to continue previous medication. Patient advised to restart using his knee support. Patient will be provided crutches for ambulation. Advised patient to follow-up with orthopedic on call this department. ____________________________________________   FINAL CLINICAL IMPRESSION(S) / ED DIAGNOSES  Final diagnoses:  Left medial knee pain      Sable Feil, PA-C 06/09/15 1527  Earleen Newport, MD 06/09/15 [phone]

## 2015-06-09 NOTE — Discharge Instructions (Signed)
Follow up with [facility].  Call in the morning for appointment.

## 2015-06-16 ENCOUNTER — Other Ambulatory Visit: Payer: Self-pay

## 2015-06-16 ENCOUNTER — Emergency Department: Payer: Medicare PPO

## 2015-06-16 ENCOUNTER — Encounter: Payer: Self-pay | Admitting: Emergency Medicine

## 2015-06-16 DIAGNOSIS — Z791 Long term (current) use of non-steroidal anti-inflammatories (NSAID): Secondary | ICD-10-CM | POA: Insufficient documentation

## 2015-06-16 DIAGNOSIS — R42 Dizziness and giddiness: Secondary | ICD-10-CM | POA: Diagnosis not present

## 2015-06-16 DIAGNOSIS — Z79899 Other long term (current) drug therapy: Secondary | ICD-10-CM | POA: Insufficient documentation

## 2015-06-16 DIAGNOSIS — Z7901 Long term (current) use of anticoagulants: Secondary | ICD-10-CM | POA: Insufficient documentation

## 2015-06-16 DIAGNOSIS — R0602 Shortness of breath: Secondary | ICD-10-CM | POA: Diagnosis present

## 2015-06-16 DIAGNOSIS — R531 Weakness: Secondary | ICD-10-CM | POA: Diagnosis not present

## 2015-06-16 DIAGNOSIS — Z87891 Personal history of nicotine dependence: Secondary | ICD-10-CM | POA: Insufficient documentation

## 2015-06-16 DIAGNOSIS — Z7951 Long term (current) use of inhaled steroids: Secondary | ICD-10-CM | POA: Insufficient documentation

## 2015-06-16 DIAGNOSIS — I10 Essential (primary) hypertension: Secondary | ICD-10-CM | POA: Insufficient documentation

## 2015-06-16 DIAGNOSIS — Z7982 Long term (current) use of aspirin: Secondary | ICD-10-CM | POA: Diagnosis not present

## 2015-06-16 DIAGNOSIS — J441 Chronic obstructive pulmonary disease with (acute) exacerbation: Secondary | ICD-10-CM | POA: Insufficient documentation

## 2015-06-16 DIAGNOSIS — E119 Type 2 diabetes mellitus without complications: Secondary | ICD-10-CM | POA: Insufficient documentation

## 2015-06-16 LAB — CBC
HCT: 43.6 % (ref 40.0–52.0)
Hemoglobin: 14.2 g/dL (ref 13.0–18.0)
MCH: 28.7 pg (ref 26.0–34.0)
MCHC: 32.6 g/dL (ref 32.0–36.0)
MCV: 88.2 fL (ref 80.0–100.0)
Platelets: 155 10*3/uL (ref 150–440)
RBC: 4.94 MIL/uL (ref 4.40–5.90)
RDW: 14.3 % (ref 11.5–14.5)
WBC: 5.7 10*3/uL (ref 3.8–10.6)

## 2015-06-16 LAB — COMPREHENSIVE METABOLIC PANEL
ALT: 17 U/L (ref 17–63)
AST: 18 U/L (ref 15–41)
Albumin: 3.7 g/dL (ref 3.5–5.0)
Alkaline Phosphatase: 60 U/L (ref 38–126)
Anion gap: 6 (ref 5–15)
BUN: 15 mg/dL (ref 6–20)
CO2: 31 mmol/L (ref 22–32)
Calcium: 9.2 mg/dL (ref 8.9–10.3)
Chloride: 106 mmol/L (ref 101–111)
Creatinine, Ser: 0.79 mg/dL (ref 0.61–1.24)
GFR calc Af Amer: >60 mL/min (ref >60)
GFR calc non Af Amer: >60 mL/min (ref >60)
Glucose, Bld: 115 mg/dL — ABNORMAL HIGH (ref 65–99)
Potassium: 4.2 mmol/L (ref 3.5–5.1)
Sodium: 143 mmol/L (ref 135–145)
Total Bilirubin: 0.9 mg/dL (ref 0.3–1.2)
Total Protein: 6.8 g/dL (ref 6.5–8.1)

## 2015-06-16 LAB — TROPONIN I: Troponin I: <0.03 ng/mL (ref <0.031)

## 2015-06-16 NOTE — ED Notes (Addendum)
Pt was seen here a week ago for chest pain; discharged from ED; this past week he was also seen at [facility] for chest pain; cardiac cath was unremarkable; follow up appt Monday with Dr Humphrey Rolls; pt says he can't wait until then; still having chest pain, shortness of breath which is worse with exertion; feels unsteady on his feet when walking; pt on home O2; talking in complete coherent sentences

## 2015-06-16 NOTE — ED Notes (Signed)
Pt's complaint of dizziness and unsteady gait reported to Dr Jimmye Norman; no order for CT Head at this time

## 2015-06-17 ENCOUNTER — Emergency Department
Admission: EM | Admit: 2015-06-17 | Discharge: 2015-06-17 | Disposition: A | Discharge status: Home / Self Care | Payer: Medicare PPO | Attending: Emergency Medicine | Admitting: Emergency Medicine

## 2015-06-17 DIAGNOSIS — J441 Chronic obstructive pulmonary disease with (acute) exacerbation: Secondary | ICD-10-CM | POA: Diagnosis not present

## 2015-06-17 MED ORDER — IPRATROPIUM-ALBUTEROL 0.5-2.5 (3) MG/3ML IN SOLN
3.0000 mL | Freq: Once | RESPIRATORY_TRACT | Status: AC
  Administered 2015-06-17: 3 mL via RESPIRATORY_TRACT
  Filled 2015-06-17: qty 3

## 2015-06-17 MED ORDER — PREDNISONE 20 MG PO TABS: 40.0000 mg | ORAL_TABLET | Freq: Every day | ORAL | Status: DC

## 2015-06-17 MED ORDER — PREDNISONE 20 MG PO TABS
40.0000 mg | ORAL_TABLET | Freq: Once | ORAL | Status: AC
  Administered 2015-06-17: 40 mg via ORAL
  Filled 2015-06-17: qty 2

## 2015-06-17 NOTE — Discharge Instructions (Signed)
Please seek medical attention for any high fevers, chest pain, shortness of breath, change in behavior, persistent vomiting, bloody stool or any other new or concerning symptoms.  Chronic Obstructive Pulmonary Disease Exacerbation Chronic obstructive pulmonary disease (COPD) is a common lung condition in which airflow from the lungs is limited. COPD is a general term that can be used to describe many different lung problems that limit airflow, including chronic bronchitis and emphysema. COPD exacerbations are episodes when breathing symptoms become much worse and require extra treatment. Without treatment, COPD exacerbations can be life threatening, and frequent COPD exacerbations can cause further damage to your lungs. CAUSES   Respiratory infections.   Exposure to smoke.   Exposure to air pollution, chemical fumes, or dust. Sometimes there is no apparent cause or trigger. RISK FACTORS  Smoking cigarettes.  Older age.  Frequent prior COPD exacerbations. SIGNS AND SYMPTOMS   Increased coughing.   Increased thick spit (sputum) production.   Increased wheezing.   Increased shortness of breath.   Rapid breathing.   Chest tightness. DIAGNOSIS  Your medical history, a physical exam, and tests will help your health care provider make a diagnosis. Tests may include:  A chest X-ray.  Basic lab tests.  Sputum testing.  An arterial blood gas test. TREATMENT  Depending on the severity of your COPD exacerbation, you may need to be admitted to a hospital for treatment. Some of the treatments commonly used to treat COPD exacerbations are:   Antibiotic medicines.   Bronchodilators. These are drugs that expand the air passages. They may be given with an inhaler or nebulizer. Spacer devices may be needed to help improve drug delivery.  Corticosteroid medicines.  Supplemental oxygen therapy.  HOME CARE INSTRUCTIONS   Do not smoke. Quitting smoking is very important to  prevent COPD from getting worse and exacerbations from happening as often.  Avoid exposure to all substances that irritate the airway, especially to tobacco smoke.   If you were prescribed an antibiotic medicine, finish it all even if you start to feel better.  Take all medicines as directed by your health care provider.It is important to use correct technique with inhaled medicines.  Drink enough fluids to keep your urine clear or pale yellow (unless you have a medical condition that requires fluid restriction).  Use a cool mist vaporizer. This makes it easier to clear your chest when you cough.   If you have a home nebulizer and oxygen, continue to use them as directed.   Maintain all necessary vaccinations to prevent infections.   Exercise regularly.   Eat a healthy diet.   Keep all follow-up appointments as directed by your health care provider. SEEK IMMEDIATE MEDICAL CARE IF:  You have worsening shortness of breath.   You have trouble talking.   You have severe chest pain.  You have blood in your sputum.  You have a fever.  You have weakness, vomit repeatedly, or faint.   You feel confused.   You continue to get worse. MAKE SURE YOU:   Understand these instructions.  Will watch your condition.  Will get help right away if you are not doing well or get worse. Document Released: 08/30/2007 Document Revised: 03/19/2014 Document Reviewed: 07/07/2013 [facility] Patient Information 2015 [place], [place]. This information is not intended to replace advice given to you by your health care provider. Make sure you discuss any questions you have with your health care provider.

## 2015-06-17 NOTE — ED Provider Notes (Signed)
[facility] Emergency Department Provider Note  ____________________________________________  Time seen: 0105  I have reviewed the triage vital signs and the nursing notes.   HISTORY  Chief Complaint Shortness of Breath; Weakness; and Dizziness   History limited by: Not Limited   HPI Lawrence [name] is a 59 y.o. male who presents to the emergency department today because he is concerned his COPD is flaring up. He states he started having some difficulty breathing yesterday. It has progressed. He states it is associated with some chest pain on deep inhalations. He has tried in his inhalers at home without any significant relief. States this is normally how COPD does start. He states he has an appointment with his pulmonologist later today. He denies any fevers. He has been having some cough.   Past Medical History  Diagnosis Date  . Hypertension   . Coronary artery disease   . COPD (chronic obstructive pulmonary disease)   . Chronic bronchitis   . Emphysema lung   . Diabetes mellitus without complication   . Asthma   . Celiac disease   . CHF (congestive heart failure)     There are no active problems to display for this patient.   Past Surgical History  Procedure Laterality Date  . Vascular surgery    . Bypass graft    . Coronary angioplasty with stent placement    . Tonsillectomy    . Cholecystectomy    . Esophageal dilation      Current Outpatient Rx  Name  Route  Sig  Dispense  Refill  . ALPRAZolam (XANAX) 1 MG tablet      TAKE 1/2-1 TABLET BY MOUTH 3 TIMES A DAY   90 tablet   3   . aspirin EC 81 MG tablet   Oral   Take 81 mg by mouth every morning.         Marland Kitchen atorvastatin (LIPITOR) 80 MG tablet   Oral   Take 1 tablet (80 mg total) by mouth at bedtime.   90 tablet   3   . buPROPion (WELLBUTRIN SR) 150 MG 12 hr tablet   Oral   Take 150 mg by mouth 2 (two) times daily.         . carvedilol (COREG) 12.5 MG tablet   Oral    Take 12.5 mg by mouth 2 (two) times daily.         . cetirizine (ZYRTEC) 10 MG tablet   Oral   Take 10 mg by mouth every morning.         . citalopram (CELEXA) 20 MG tablet   Oral   Take 20 mg by mouth daily.         . clopidogrel (PLAVIX) 75 MG tablet      TAKE ONE (1) TABLET EACH DAY   30 tablet   5   . esomeprazole (NEXIUM) 40 MG capsule   Oral   Take 40 mg by mouth every morning.         . Fluticasone-Salmeterol (ADVAIR) 500-50 MCG/DOSE AEPB   Inhalation   Inhale 1 puff into the lungs 2 (two) times daily.         . furosemide (LASIX) 40 MG tablet   Oral   Take 40 mg by mouth every morning.         . hyoscyamine (LEVSIN SL) 0.125 MG SL tablet   Sublingual   Place 0.125 mg under the tongue every 6 (six) hours.          Marland Kitchen  isosorbide mononitrate (IMDUR) 30 MG 24 hr tablet   Oral   Take 30 mg by mouth every morning.         . lactulose (CHRONULAC) 10 GM/15ML solution   Oral   Take 30 mLs (20 g total) by mouth daily as needed for mild constipation.   120 mL   0   . lansoprazole (PREVACID) 30 MG capsule   Oral   Take 30 mg by mouth every morning.         . levalbuterol (XOPENEX HFA) 45 MCG/ACT inhaler   Inhalation   Inhale 1-2 puffs into the lungs every 4 (four) hours as needed for shortness of breath.          . levofloxacin (LEVAQUIN) 750 MG tablet   Oral   Take 1 tablet (750 mg total) by mouth daily.   5 tablet   0   . lisinopril (PRINIVIL,ZESTRIL) 20 MG tablet   Oral   Take 20 mg by mouth daily.         Marland Kitchen lisinopril-hydrochlorothiazide (PRINZIDE,ZESTORETIC) 20-12.5 MG per tablet   Oral   Take 1 tablet by mouth every morning.         . magnesium citrate SOLN   Oral   Take 296 mLs (1 Bottle total) by mouth once.   195 mL   0   . meloxicam (MOBIC) 7.5 MG tablet   Oral   Take 1 tablet (7.5 mg total) by mouth daily.   10 tablet   0   . metFORMIN (GLUCOPHAGE) 500 MG tablet   Oral   Take 1 tablet (500 mg total) by mouth  every morning.   90 tablet   4   . nitroGLYCERIN (NITROSTAT) 0.4 MG SL tablet   Sublingual   Place 1 tablet (0.4 mg total) under the tongue every 5 (five) minutes as needed for chest pain. Every 5 minutes as needed for chest pain   30 tablet   1   . omega-3 acid ethyl esters (LOVAZA) 1 G capsule   Oral   Take 4 g by mouth every morning.         Marland Kitchen oxyCODONE-acetaminophen (PERCOCET) 10-325 MG per tablet   Oral   Take 1 tablet by mouth every 4 (four) hours as needed for pain.   150 tablet   0   . polyethylene glycol (MIRALAX / GLYCOLAX) packet   Oral   Take 17 g by mouth daily.   14 each   0   . potassium chloride (K-DUR,KLOR-CON) 10 MEQ tablet   Oral   Take 10 mEq by mouth every morning.         . ranitidine (ZANTAC) 150 MG tablet   Oral   Take 300 mg by mouth 2 (two) times daily.         . tamsulosin (FLOMAX) 0.4 MG CAPS capsule   Oral   Take 0.4 mg by mouth daily.         Marland Kitchen tiotropium (SPIRIVA) 18 MCG inhalation capsule   Inhalation   Place 18 mcg into inhaler and inhale daily.           Allergies Demerol and Sulfa antibiotics  History reviewed. No pertinent family history.  Social History History  Substance Use Topics  . Smoking status: Former Research scientist (life sciences)  . Smokeless tobacco: Never Used  . Alcohol Use: No    Review of Systems  Constitutional: Negative for fever. Cardiovascular: Positive for chest pain. Respiratory: Positive for shortness of breath.  Gastrointestinal: Negative for abdominal pain, vomiting and diarrhea. Genitourinary: Negative for dysuria. Musculoskeletal: Negative for back pain. Skin: Negative for rash. Neurological: Negative for headaches, focal weakness or numbness.   10-point ROS otherwise negative.  ____________________________________________   PHYSICAL EXAM:  VITAL SIGNS: ED Triage Vitals  Enc Vitals Group     BP 06/16/15 2054 158/81 mmHg     Pulse Rate 06/16/15 2054 70     Resp 06/16/15 2054 22     Temp  06/16/15 2054 97.8 F (36.6 C)     Temp Source 06/16/15 2054 Oral     SpO2 06/16/15 2054 95 %     Weight 06/16/15 2054 221 lb (100.245 kg)     Height 06/16/15 2054 5' 7"  (1.702 m)     Head Cir --      Peak Flow --      Pain Score 06/16/15 2055 8   Constitutional: Alert and oriented. Well appearing and in no distress. Eyes: Conjunctivae are normal. PERRL. Normal extraocular movements. ENT   Head: Normocephalic and atraumatic.   Nose: No congestion/rhinnorhea.   Mouth/Throat: Mucous membranes are moist.   Neck: No stridor. Hematological/Lymphatic/Immunilogical: No cervical lymphadenopathy. Cardiovascular: Normal rate, regular rhythm.  No murmurs, rubs, or gallops. Respiratory: Normal respiratory effort without tachypnea nor retractions. Very minimal bilateral expiratory wheeze the mid lung. Gastrointestinal: Soft and nontender. No distention. There is no CVA tenderness. Genitourinary: Deferred Musculoskeletal: Normal range of motion in all extremities. No joint effusions.  No lower extremity tenderness nor edema. Neurologic:  Normal speech and language. No gross focal neurologic deficits are appreciated. Speech is normal.  Skin:  Skin is warm, dry and intact. No rash noted. Psychiatric: Mood and affect are normal. Speech and behavior are normal. Patient exhibits appropriate insight and judgment.  ____________________________________________    LABS (pertinent positives/negatives)  Labs Reviewed  COMPREHENSIVE METABOLIC PANEL - Abnormal; Notable for the following:    Glucose, Bld 115 (*)    All other components within normal limits  CBC  TROPONIN I     ____________________________________________   EKG  I, Nance Pear, attending physician, personally viewed and interpreted this EKG  EKG Time: 2058 Rate: 70 Rhythm: normal sinus rhythm Axis: normal Intervals: normal QRS: normal ST changes: no st  elevation    ____________________________________________    RADIOLOGY  CXR IMPRESSION: No acute cardiopulmonary disease. Minimal linear atelectasis right base.  ____________________________________________   PROCEDURES  Procedure(s) performed: None  Critical Care performed: No  ____________________________________________   INITIAL IMPRESSION / ASSESSMENT AND PLAN / ED COURSE  Pertinent labs & imaging results that were available during my care of the patient were reviewed by me and considered in my medical decision making (see chart for details).  Patient presents to the emergency department with concerns for COPD exacerbation. Patient did have mild respiratory wheezing. After DuoNeb treatment patient's urine was feeling better. Will discharge home with prescription for prednisone. In addition encourage patient to keep scheduled appointment with his pulmonologist today.  ____________________________________________   FINAL CLINICAL IMPRESSION(S) / ED DIAGNOSES  Final diagnoses:  Chronic obstructive pulmonary disease with acute exacerbation     Nance Pear, MD 06/17/15 7867

## 2015-06-17 NOTE — ED Notes (Addendum)
Pt states had cardiac cath last week after chest pain for over one week. Pt states since cardiac cath has had anterior bilateral chest pain, wheezing, cough that is non productive and intermittent shob. Pt states is chronically on oxygen at 2lpm via [place]. Pt states "i wheeze all the time but it's worse since that cardiac catheterization." pt states has had some nausea, "but not now". Pt denies known fever, denies chills. Pt lying in bed during this assessment watching tv in no acute distress.

## 2015-06-25 ENCOUNTER — Emergency Department: Payer: Medicare PPO

## 2015-06-25 DIAGNOSIS — E785 Hyperlipidemia, unspecified: Secondary | ICD-10-CM | POA: Diagnosis present

## 2015-06-25 DIAGNOSIS — K219 Gastro-esophageal reflux disease without esophagitis: Secondary | ICD-10-CM | POA: Diagnosis present

## 2015-06-25 DIAGNOSIS — Z9049 Acquired absence of other specified parts of digestive tract: Secondary | ICD-10-CM | POA: Diagnosis present

## 2015-06-25 DIAGNOSIS — Z87891 Personal history of nicotine dependence: Secondary | ICD-10-CM

## 2015-06-25 DIAGNOSIS — Z9889 Other specified postprocedural states: Secondary | ICD-10-CM

## 2015-06-25 DIAGNOSIS — I10 Essential (primary) hypertension: Secondary | ICD-10-CM | POA: Diagnosis present

## 2015-06-25 DIAGNOSIS — F329 Major depressive disorder, single episode, unspecified: Secondary | ICD-10-CM | POA: Diagnosis present

## 2015-06-25 DIAGNOSIS — J45909 Unspecified asthma, uncomplicated: Secondary | ICD-10-CM | POA: Diagnosis present

## 2015-06-25 DIAGNOSIS — M549 Dorsalgia, unspecified: Secondary | ICD-10-CM | POA: Diagnosis present

## 2015-06-25 DIAGNOSIS — J440 Chronic obstructive pulmonary disease with acute lower respiratory infection: Principal | ICD-10-CM | POA: Diagnosis present

## 2015-06-25 DIAGNOSIS — G8929 Other chronic pain: Secondary | ICD-10-CM | POA: Diagnosis present

## 2015-06-25 DIAGNOSIS — Z79899 Other long term (current) drug therapy: Secondary | ICD-10-CM

## 2015-06-25 DIAGNOSIS — E119 Type 2 diabetes mellitus without complications: Secondary | ICD-10-CM | POA: Diagnosis present

## 2015-06-25 DIAGNOSIS — Z9981 Dependence on supplemental oxygen: Secondary | ICD-10-CM

## 2015-06-25 DIAGNOSIS — K900 Celiac disease: Secondary | ICD-10-CM | POA: Diagnosis present

## 2015-06-25 DIAGNOSIS — I493 Ventricular premature depolarization: Secondary | ICD-10-CM | POA: Diagnosis present

## 2015-06-25 DIAGNOSIS — Z888 Allergy status to other drugs, medicaments and biological substances status: Secondary | ICD-10-CM

## 2015-06-25 DIAGNOSIS — J441 Chronic obstructive pulmonary disease with (acute) exacerbation: Secondary | ICD-10-CM | POA: Diagnosis present

## 2015-06-25 DIAGNOSIS — Z951 Presence of aortocoronary bypass graft: Secondary | ICD-10-CM

## 2015-06-25 DIAGNOSIS — Z825 Family history of asthma and other chronic lower respiratory diseases: Secondary | ICD-10-CM

## 2015-06-25 DIAGNOSIS — I2510 Atherosclerotic heart disease of native coronary artery without angina pectoris: Secondary | ICD-10-CM | POA: Diagnosis present

## 2015-06-25 DIAGNOSIS — N400 Enlarged prostate without lower urinary tract symptoms: Secondary | ICD-10-CM | POA: Diagnosis present

## 2015-06-25 DIAGNOSIS — I509 Heart failure, unspecified: Secondary | ICD-10-CM | POA: Diagnosis present

## 2015-06-25 DIAGNOSIS — Z882 Allergy status to sulfonamides status: Secondary | ICD-10-CM

## 2015-06-25 DIAGNOSIS — Z7982 Long term (current) use of aspirin: Secondary | ICD-10-CM

## 2015-06-25 DIAGNOSIS — Z820 Family history of epilepsy and other diseases of the nervous system: Secondary | ICD-10-CM

## 2015-06-25 DIAGNOSIS — Z8249 Family history of ischemic heart disease and other diseases of the circulatory system: Secondary | ICD-10-CM

## 2015-06-25 DIAGNOSIS — J209 Acute bronchitis, unspecified: Secondary | ICD-10-CM | POA: Diagnosis present

## 2015-06-25 LAB — CBC
HCT: 44.5 % (ref 40.0–52.0)
Hemoglobin: 14.2 g/dL (ref 13.0–18.0)
MCH: 28.2 pg (ref 26.0–34.0)
MCHC: 32 g/dL (ref 32.0–36.0)
MCV: 88.3 fL (ref 80.0–100.0)
Platelets: 211 10*3/uL (ref 150–440)
RBC: 5.04 MIL/uL (ref 4.40–5.90)
RDW: 15 % — ABNORMAL HIGH (ref 11.5–14.5)
WBC: 11.1 10*3/uL — ABNORMAL HIGH (ref 3.8–10.6)

## 2015-06-25 LAB — TROPONIN I: Troponin I: <0.03 ng/mL (ref <0.031)

## 2015-06-25 LAB — BRAIN NATRIURETIC PEPTIDE: B Natriuretic Peptide: 68 pg/mL (ref 0.0–100.0)

## 2015-06-25 LAB — BASIC METABOLIC PANEL
Anion gap: 8 (ref 5–15)
BUN: 14 mg/dL (ref 6–20)
CO2: 27 mmol/L (ref 22–32)
Calcium: 9.5 mg/dL (ref 8.9–10.3)
Chloride: 103 mmol/L (ref 101–111)
Creatinine, Ser: 0.84 mg/dL (ref 0.61–1.24)
GFR calc Af Amer: >60 mL/min (ref >60)
GFR calc non Af Amer: >60 mL/min (ref >60)
Glucose, Bld: 170 mg/dL — ABNORMAL HIGH (ref 65–99)
Potassium: 4 mmol/L (ref 3.5–5.1)
Sodium: 138 mmol/L (ref 135–145)

## 2015-06-25 NOTE — ED Notes (Addendum)
Patient presents reporting SOB, states "I am having CHF attack". Of note, patient was seen on Saturday at [facility] and Dx/'d with AECOPD and was Rx'd Prednisone. Patient with appointment with Dr. Humphrey Rolls this week for an echo - states, "I cant make it." NAD noted in triage; able to speak in complete sentences; no increased WOB noted; patient on 2L/[place] of continuous supplemental oxygen. Patient had cardiac cath x 2 weeks ago. Reporting 4lb weight gain in the last 2 days.

## 2015-06-26 ENCOUNTER — Inpatient Hospital Stay
Admission: EM | Admit: 2015-06-26 | Discharge: 2015-06-27 | DRG: 192 | Disposition: A | Discharge status: Home / Self Care | Payer: Medicare PPO | Attending: Internal Medicine | Admitting: Internal Medicine

## 2015-06-26 ENCOUNTER — Inpatient Hospital Stay (HOSPITAL_COMMUNITY)
Admit: 2015-06-26 | Discharge: 2015-06-26 | Disposition: A | Discharge status: Home / Self Care | Payer: Medicare PPO | Attending: Internal Medicine | Admitting: Internal Medicine

## 2015-06-26 DIAGNOSIS — Z951 Presence of aortocoronary bypass graft: Secondary | ICD-10-CM | POA: Diagnosis not present

## 2015-06-26 DIAGNOSIS — K900 Celiac disease: Secondary | ICD-10-CM | POA: Diagnosis present

## 2015-06-26 DIAGNOSIS — I10 Essential (primary) hypertension: Secondary | ICD-10-CM | POA: Diagnosis present

## 2015-06-26 DIAGNOSIS — Z9981 Dependence on supplemental oxygen: Secondary | ICD-10-CM | POA: Diagnosis not present

## 2015-06-26 DIAGNOSIS — J45909 Unspecified asthma, uncomplicated: Secondary | ICD-10-CM | POA: Diagnosis present

## 2015-06-26 DIAGNOSIS — M549 Dorsalgia, unspecified: Secondary | ICD-10-CM | POA: Diagnosis present

## 2015-06-26 DIAGNOSIS — I2510 Atherosclerotic heart disease of native coronary artery without angina pectoris: Secondary | ICD-10-CM | POA: Insufficient documentation

## 2015-06-26 DIAGNOSIS — I493 Ventricular premature depolarization: Secondary | ICD-10-CM | POA: Diagnosis present

## 2015-06-26 DIAGNOSIS — J440 Chronic obstructive pulmonary disease with acute lower respiratory infection: Secondary | ICD-10-CM | POA: Diagnosis present

## 2015-06-26 DIAGNOSIS — I509 Heart failure, unspecified: Secondary | ICD-10-CM | POA: Diagnosis present

## 2015-06-26 DIAGNOSIS — E119 Type 2 diabetes mellitus without complications: Secondary | ICD-10-CM | POA: Diagnosis present

## 2015-06-26 DIAGNOSIS — Z888 Allergy status to other drugs, medicaments and biological substances status: Secondary | ICD-10-CM | POA: Diagnosis not present

## 2015-06-26 DIAGNOSIS — Z9049 Acquired absence of other specified parts of digestive tract: Secondary | ICD-10-CM | POA: Diagnosis present

## 2015-06-26 DIAGNOSIS — N400 Enlarged prostate without lower urinary tract symptoms: Secondary | ICD-10-CM | POA: Diagnosis present

## 2015-06-26 DIAGNOSIS — J441 Chronic obstructive pulmonary disease with (acute) exacerbation: Secondary | ICD-10-CM | POA: Diagnosis present

## 2015-06-26 DIAGNOSIS — Z825 Family history of asthma and other chronic lower respiratory diseases: Secondary | ICD-10-CM | POA: Diagnosis not present

## 2015-06-26 DIAGNOSIS — I2583 Coronary atherosclerosis due to lipid rich plaque: Secondary | ICD-10-CM

## 2015-06-26 DIAGNOSIS — Z882 Allergy status to sulfonamides status: Secondary | ICD-10-CM | POA: Diagnosis not present

## 2015-06-26 DIAGNOSIS — Z8249 Family history of ischemic heart disease and other diseases of the circulatory system: Secondary | ICD-10-CM | POA: Diagnosis not present

## 2015-06-26 DIAGNOSIS — Z820 Family history of epilepsy and other diseases of the nervous system: Secondary | ICD-10-CM | POA: Diagnosis not present

## 2015-06-26 DIAGNOSIS — K219 Gastro-esophageal reflux disease without esophagitis: Secondary | ICD-10-CM | POA: Diagnosis present

## 2015-06-26 DIAGNOSIS — Z7982 Long term (current) use of aspirin: Secondary | ICD-10-CM | POA: Diagnosis not present

## 2015-06-26 DIAGNOSIS — Z87891 Personal history of nicotine dependence: Secondary | ICD-10-CM | POA: Diagnosis not present

## 2015-06-26 DIAGNOSIS — E1121 Type 2 diabetes mellitus with diabetic nephropathy: Secondary | ICD-10-CM

## 2015-06-26 DIAGNOSIS — J209 Acute bronchitis, unspecified: Secondary | ICD-10-CM | POA: Diagnosis present

## 2015-06-26 DIAGNOSIS — Z79899 Other long term (current) drug therapy: Secondary | ICD-10-CM | POA: Diagnosis not present

## 2015-06-26 DIAGNOSIS — R0600 Dyspnea, unspecified: Secondary | ICD-10-CM

## 2015-06-26 DIAGNOSIS — E785 Hyperlipidemia, unspecified: Secondary | ICD-10-CM | POA: Diagnosis present

## 2015-06-26 DIAGNOSIS — F329 Major depressive disorder, single episode, unspecified: Secondary | ICD-10-CM | POA: Diagnosis present

## 2015-06-26 DIAGNOSIS — G8929 Other chronic pain: Secondary | ICD-10-CM | POA: Diagnosis present

## 2015-06-26 DIAGNOSIS — Z9889 Other specified postprocedural states: Secondary | ICD-10-CM | POA: Diagnosis not present

## 2015-06-26 DIAGNOSIS — R079 Chest pain, unspecified: Secondary | ICD-10-CM

## 2015-06-26 LAB — TROPONIN I
Troponin I: <0.03 ng/mL (ref <0.031)
Troponin I: <0.03 ng/mL (ref <0.031)

## 2015-06-26 LAB — GLUCOSE, CAPILLARY
Glucose-Capillary: 241 mg/dL — ABNORMAL HIGH (ref 65–99)
Glucose-Capillary: 247 mg/dL — ABNORMAL HIGH (ref 65–99)
Glucose-Capillary: 179 mg/dL — ABNORMAL HIGH (ref 65–99)
Glucose-Capillary: 190 mg/dL — ABNORMAL HIGH (ref 65–99)

## 2015-06-26 MED ORDER — ALPRAZOLAM 0.5 MG PO TABS
0.5000 mg | ORAL_TABLET | Freq: Three times a day (TID) | ORAL | Status: DC | PRN
  Administered 2015-06-27: 0.5 mg via ORAL
  Filled 2015-06-26: qty 1

## 2015-06-26 MED ORDER — ATORVASTATIN CALCIUM 20 MG PO TABS
80.0000 mg | ORAL_TABLET | Freq: Every day | ORAL | Status: DC
  Administered 2015-06-26: 80 mg via ORAL
  Filled 2015-06-26: qty 4

## 2015-06-26 MED ORDER — NITROGLYCERIN 0.4 MG SL SUBL: 0.4000 mg | SUBLINGUAL_TABLET | SUBLINGUAL | Status: DC | PRN

## 2015-06-26 MED ORDER — LISINOPRIL 20 MG PO TABS
20.0000 mg | ORAL_TABLET | Freq: Every day | ORAL | Status: DC
  Administered 2015-06-26 – 2015-06-27 (×2): 20 mg via ORAL
  Filled 2015-06-26 (×2): qty 1

## 2015-06-26 MED ORDER — ACETAMINOPHEN-CODEINE #3 300-30 MG PO TABS
1.0000 | ORAL_TABLET | Freq: Four times a day (QID) | ORAL | Status: DC | PRN
  Administered 2015-06-26: 1 via ORAL
  Filled 2015-06-26: qty 1

## 2015-06-26 MED ORDER — ASPIRIN EC 81 MG PO TBEC: 81.0000 mg | DELAYED_RELEASE_TABLET | Freq: Every morning | ORAL | Status: DC

## 2015-06-26 MED ORDER — IPRATROPIUM-ALBUTEROL 0.5-2.5 (3) MG/3ML IN SOLN
3.0000 mL | Freq: Four times a day (QID) | RESPIRATORY_TRACT | Status: DC
  Administered 2015-06-26 – 2015-06-27 (×5): 3 mL via RESPIRATORY_TRACT
  Filled 2015-06-26 (×5): qty 3

## 2015-06-26 MED ORDER — TAMSULOSIN HCL 0.4 MG PO CAPS
0.4000 mg | ORAL_CAPSULE | Freq: Every day | ORAL | Status: DC
  Administered 2015-06-26 – 2015-06-27 (×2): 0.4 mg via ORAL
  Filled 2015-06-26 (×2): qty 1

## 2015-06-26 MED ORDER — IPRATROPIUM-ALBUTEROL 0.5-2.5 (3) MG/3ML IN SOLN
3.0000 mL | Freq: Once | RESPIRATORY_TRACT | Status: AC
  Administered 2015-06-26: 3 mL via RESPIRATORY_TRACT
  Filled 2015-06-26: qty 3

## 2015-06-26 MED ORDER — MAGNESIUM HYDROXIDE 400 MG/5ML PO SUSP: 5.0000 mL | Freq: Every day | ORAL | Status: DC | PRN

## 2015-06-26 MED ORDER — LORATADINE 10 MG PO TABS
10.0000 mg | ORAL_TABLET | Freq: Every day | ORAL | Status: DC
  Administered 2015-06-26 – 2015-06-27 (×2): 10 mg via ORAL
  Filled 2015-06-26 (×2): qty 1

## 2015-06-26 MED ORDER — SODIUM CHLORIDE 0.9 % IV SOLN: 250.0000 mL | INTRAVENOUS | Status: DC | PRN

## 2015-06-26 MED ORDER — POLYETHYLENE GLYCOL 3350 17 G PO PACK
17.0000 g | PACK | Freq: Every day | ORAL | Status: DC
  Administered 2015-06-27: 17 g via ORAL
  Filled 2015-06-26: qty 1

## 2015-06-26 MED ORDER — ENOXAPARIN SODIUM 40 MG/0.4ML ~~LOC~~ SOLN
40.0000 mg | SUBCUTANEOUS | Status: DC
  Administered 2015-06-26 – 2015-06-27 (×2): 40 mg via SUBCUTANEOUS
  Filled 2015-06-26 (×2): qty 0.4

## 2015-06-26 MED ORDER — AZITHROMYCIN 250 MG PO TABS
500.0000 mg | ORAL_TABLET | Freq: Every day | ORAL | Status: DC
  Administered 2015-06-27: 500 mg via ORAL
  Filled 2015-06-26: qty 2

## 2015-06-26 MED ORDER — ISOSORBIDE MONONITRATE ER 60 MG PO TB24
60.0000 mg | ORAL_TABLET | Freq: Every morning | ORAL | Status: DC
  Administered 2015-06-26 – 2015-06-27 (×2): 60 mg via ORAL
  Filled 2015-06-26 (×2): qty 1

## 2015-06-26 MED ORDER — TIOTROPIUM BROMIDE MONOHYDRATE 18 MCG IN CAPS
18.0000 ug | ORAL_CAPSULE | Freq: Every day | RESPIRATORY_TRACT | Status: DC
  Administered 2015-06-26 – 2015-06-27 (×2): 18 ug via RESPIRATORY_TRACT
  Filled 2015-06-26: qty 5

## 2015-06-26 MED ORDER — METFORMIN HCL 500 MG PO TABS
500.0000 mg | ORAL_TABLET | Freq: Every morning | ORAL | Status: DC
  Administered 2015-06-26 – 2015-06-27 (×2): 500 mg via ORAL
  Filled 2015-06-26 (×2): qty 1

## 2015-06-26 MED ORDER — ASPIRIN 81 MG PO CHEW
324.0000 mg | CHEWABLE_TABLET | Freq: Once | ORAL | Status: AC
  Administered 2015-06-26: 324 mg via ORAL
  Filled 2015-06-26: qty 4

## 2015-06-26 MED ORDER — MAGNESIUM HYDROXIDE 400 MG/5ML PO SUSP
30.0000 mL | Freq: Every day | ORAL | Status: DC | PRN
  Administered 2015-06-26: 30 mL via ORAL
  Filled 2015-06-26: qty 30

## 2015-06-26 MED ORDER — FUROSEMIDE 40 MG PO TABS
40.0000 mg | ORAL_TABLET | Freq: Every morning | ORAL | Status: DC
  Administered 2015-06-26 – 2015-06-27 (×2): 40 mg via ORAL
  Filled 2015-06-26 (×3): qty 1

## 2015-06-26 MED ORDER — METHYLPREDNISOLONE SODIUM SUCC 40 MG IJ SOLR
40.0000 mg | Freq: Four times a day (QID) | INTRAMUSCULAR | Status: DC
Start: 2015-06-26 — End: 2015-06-27
  Administered 2015-06-26 – 2015-06-27 (×6): 40 mg via INTRAVENOUS
  Filled 2015-06-26 (×6): qty 1

## 2015-06-26 MED ORDER — MORPHINE SULFATE 4 MG/ML IJ SOLN
4.0000 mg | Freq: Once | INTRAMUSCULAR | Status: AC
  Administered 2015-06-26: 4 mg via INTRAVENOUS
  Filled 2015-06-26: qty 1

## 2015-06-26 MED ORDER — ONDANSETRON HCL 4 MG/2ML IJ SOLN: 4.0000 mg | Freq: Four times a day (QID) | INTRAMUSCULAR | Status: DC | PRN

## 2015-06-26 MED ORDER — INSULIN ASPART 100 UNIT/ML ~~LOC~~ SOLN
0.0000 [IU] | Freq: Three times a day (TID) | SUBCUTANEOUS | Status: DC
  Administered 2015-06-26: 2 [IU] via SUBCUTANEOUS
  Administered 2015-06-26 (×2): 3 [IU] via SUBCUTANEOUS
  Administered 2015-06-27: 2 [IU] via SUBCUTANEOUS
  Filled 2015-06-26: qty 3
  Filled 2015-06-26: qty 2
  Filled 2015-06-26: qty 3
  Filled 2015-06-26: qty 2

## 2015-06-26 MED ORDER — SODIUM CHLORIDE 0.9 % IJ SOLN: 3.0000 mL | INTRAMUSCULAR | Status: DC | PRN

## 2015-06-26 MED ORDER — ACETAMINOPHEN 650 MG RE SUPP: 650.0000 mg | Freq: Four times a day (QID) | RECTAL | Status: DC | PRN

## 2015-06-26 MED ORDER — ACETAMINOPHEN 325 MG PO TABS: 650.0000 mg | ORAL_TABLET | Freq: Four times a day (QID) | ORAL | Status: DC | PRN

## 2015-06-26 MED ORDER — CARVEDILOL 12.5 MG PO TABS
12.5000 mg | ORAL_TABLET | Freq: Two times a day (BID) | ORAL | Status: DC
  Administered 2015-06-26 – 2015-06-27 (×2): 12.5 mg via ORAL
  Filled 2015-06-26 (×2): qty 1

## 2015-06-26 MED ORDER — CITALOPRAM HYDROBROMIDE 20 MG PO TABS
20.0000 mg | ORAL_TABLET | Freq: Every day | ORAL | Status: DC
  Administered 2015-06-26 – 2015-06-27 (×2): 20 mg via ORAL
  Filled 2015-06-26 (×2): qty 1

## 2015-06-26 MED ORDER — SODIUM CHLORIDE 0.9 % IJ SOLN
3.0000 mL | Freq: Two times a day (BID) | INTRAMUSCULAR | Status: DC
  Administered 2015-06-26 (×2): 3 mL via INTRAVENOUS

## 2015-06-26 MED ORDER — CLOPIDOGREL BISULFATE 75 MG PO TABS
75.0000 mg | ORAL_TABLET | Freq: Every day | ORAL | Status: DC
  Administered 2015-06-26 – 2015-06-27 (×2): 75 mg via ORAL
  Filled 2015-06-26 (×2): qty 1

## 2015-06-26 MED ORDER — MOMETASONE FURO-FORMOTEROL FUM 200-5 MCG/ACT IN AERO
2.0000 | INHALATION_SPRAY | Freq: Two times a day (BID) | RESPIRATORY_TRACT | Status: DC
  Administered 2015-06-26 – 2015-06-27 (×3): 2 via RESPIRATORY_TRACT
  Filled 2015-06-26: qty 8.8

## 2015-06-26 MED ORDER — OMEGA-3-ACID ETHYL ESTERS 1 G PO CAPS
4.0000 g | ORAL_CAPSULE | Freq: Every morning | ORAL | Status: DC
  Administered 2015-06-26 – 2015-06-27 (×2): 4 g via ORAL
  Filled 2015-06-26 (×2): qty 4

## 2015-06-26 MED ORDER — HYOSCYAMINE SULFATE 0.125 MG SL SUBL
0.1250 mg | SUBLINGUAL_TABLET | Freq: Four times a day (QID) | SUBLINGUAL | Status: DC
  Administered 2015-06-26 – 2015-06-27 (×6): 0.125 mg via SUBLINGUAL
  Filled 2015-06-26 (×9): qty 1

## 2015-06-26 MED ORDER — POTASSIUM CHLORIDE CRYS ER 10 MEQ PO TBCR
10.0000 meq | EXTENDED_RELEASE_TABLET | Freq: Every morning | ORAL | Status: DC
  Administered 2015-06-26 – 2015-06-27 (×2): 10 meq via ORAL
  Filled 2015-06-26 (×2): qty 1

## 2015-06-26 MED ORDER — ONDANSETRON HCL 4 MG PO TABS: 4.0000 mg | ORAL_TABLET | Freq: Four times a day (QID) | ORAL | Status: DC | PRN

## 2015-06-26 MED ORDER — METHYLPREDNISOLONE SODIUM SUCC 125 MG IJ SOLR
125.0000 mg | Freq: Once | INTRAMUSCULAR | Status: AC
  Administered 2015-06-26: 125 mg via INTRAVENOUS
  Filled 2015-06-26: qty 2

## 2015-06-26 MED ORDER — FAMOTIDINE 20 MG PO TABS
20.0000 mg | ORAL_TABLET | Freq: Every day | ORAL | Status: DC
  Administered 2015-06-26 – 2015-06-27 (×2): 20 mg via ORAL
  Filled 2015-06-26 (×2): qty 1

## 2015-06-26 MED ORDER — OXYCODONE-ACETAMINOPHEN 5-325 MG PO TABS
2.0000 | ORAL_TABLET | ORAL | Status: DC | PRN
  Administered 2015-06-26 – 2015-06-27 (×5): 2 via ORAL
  Filled 2015-06-26 (×5): qty 2

## 2015-06-26 MED ORDER — DEXTROSE 5 % IV SOLN
500.0000 mg | INTRAVENOUS | Status: DC
  Administered 2015-06-26: 500 mg via INTRAVENOUS
  Filled 2015-06-26 (×2): qty 500

## 2015-06-26 MED ORDER — ASPIRIN EC 81 MG PO TBEC
81.0000 mg | DELAYED_RELEASE_TABLET | Freq: Every day | ORAL | Status: DC
  Administered 2015-06-26 – 2015-06-27 (×2): 81 mg via ORAL
  Filled 2015-06-26 (×2): qty 1

## 2015-06-26 NOTE — Progress Notes (Signed)
*  PRELIMINARY RESULTS* Echocardiogram 2D Echocardiogram has been performed.  Lawrence [name] 06/26/2015, 12:15 PM

## 2015-06-26 NOTE — Progress Notes (Signed)
A & O. Ambulated in the hallway and tolerated it well. Pt reported pain and oxy was given. No tele. Room air. Takes meds ok. FS are stable. ECHO was completed. Pt has no further concerns at this time.

## 2015-06-26 NOTE — Care Management (Signed)
Patient admitted with Shortness of breath secondary to COPD exacerbation secondary to possible acute bronchitis.  Patient lives at home with his wife.  Patient uses the pharmacy at [facility] on [address].  Patient currently has chronic home O2 use of 2 Liters.  Patient started requiring home O2 2 weeks ago. During this hospital admission patient has been weaned to RA with sats of 95% with exertion.   Patient states that he has a walker and crutches as home, however does not need them for ambulation.  Will continue to follow patient for discharge planning and need of home O2 at time of discharge.

## 2015-06-26 NOTE — Plan of Care (Signed)
Problem: Phase II Progression Outcomes Goal: IV changed to normal saline lock Outcome: Not Met (add Reason) laxis is running @ 4

## 2015-06-26 NOTE — Progress Notes (Signed)
[place] [facility] [place] NAME: Lawrence [name]    MR#:  [id number]  DATE OF BIRTH:  10-29-1956  SUBJECTIVE:  CHIEF COMPLAINT:   Chief Complaint  Patient presents with  . Shortness of Breath  Having lots of back pain and requesting his chronic pain meds - percocet REVIEW OF SYSTEMS:  Review of Systems  Constitutional: Negative for fever, weight loss, malaise/fatigue and diaphoresis.  HENT: Negative for ear discharge, ear pain, hearing loss, nosebleeds, sore throat and tinnitus.   Eyes: Negative for blurred vision and pain.  Respiratory: Positive for cough, sputum production and shortness of breath. Negative for hemoptysis and wheezing.   Cardiovascular: Negative for chest pain, palpitations, orthopnea and leg swelling.  Gastrointestinal: Negative for heartburn, nausea, vomiting, abdominal pain, diarrhea, constipation and blood in stool.  Genitourinary: Negative for dysuria, urgency and frequency.  Musculoskeletal: Positive for back pain (chronic). Negative for myalgias.  Skin: Negative for itching and rash.  Neurological: Negative for dizziness, tingling, tremors, focal weakness, seizures, weakness and headaches.  Psychiatric/Behavioral: Negative for depression. The patient is not nervous/anxious.    DRUG ALLERGIES:   Allergies  Allergen Reactions  . Demerol [Meperidine] Hives  . Sulfa Antibiotics Hives   VITALS:  Blood pressure 159/91, pulse 74, temperature 97.6 F (36.4 C), temperature source Oral, resp. rate 18, height 5' 7"  (1.702 m), weight 97.07 kg (214 lb), SpO2 97 %. PHYSICAL EXAMINATION:  Physical Exam  Constitutional: He is oriented to person, place, and time and well-developed, well-nourished, and in no distress.  HENT:  Head: Normocephalic and atraumatic.  Eyes: Conjunctivae and EOM are normal. Pupils are equal, round, and reactive to light.  Neck: Normal range of motion. Neck supple. No tracheal deviation present. No  thyromegaly present.  Cardiovascular: Normal rate, regular rhythm and normal heart sounds.   Pulmonary/Chest: Effort normal and breath sounds normal. No respiratory distress. He has no wheezes. He exhibits no tenderness.  Abdominal: Soft. Bowel sounds are normal. He exhibits no distension. There is no tenderness.  Musculoskeletal: Normal range of motion.  Neurological: He is alert and oriented to person, place, and time. No cranial nerve deficit.  Skin: Skin is warm and dry. No rash noted.  Psychiatric: Mood and affect normal.   LABORATORY PANEL:   CBC  Recent Labs Lab 06/25/15 2215  WBC 11.1*  HGB 14.2  HCT 44.5  PLT 211   ------------------------------------------------------------------------------------------------------------------ Chemistries   Recent Labs Lab 06/25/15 2215  NA 138  K 4.0  CL 103  CO2 27  GLUCOSE 170*  BUN 14  CREATININE 0.84  CALCIUM 9.5   RADIOLOGY:  Dg Chest 2 View  06/25/2015   CLINICAL DATA:  Shortness of Breath  EXAM: CHEST  2 VIEW  COMPARISON:  June 16, 2015  FINDINGS: There is mild atelectasis in the left base. There is no edema or consolidation. Heart is upper normal in size with pulmonary vascularity within normal limits. Patient is status post internal mammary bypass grafting. No adenopathy. There is degenerative change in the thoracic spine. There is mild anterior wedging of several lower thoracic vertebral bodies, stable.  IMPRESSION: Mild left base atelectasis. No edema or consolidation. No change in cardiac silhouette.   Electronically Signed   By: Lowella Grip III M.D.   On: 06/25/2015 22:36   ASSESSMENT AND PLAN:   1. Shortness of breath secondary to COPD exacerbation secondary to possible acute bronchitis. Continue O2 supplementation, vigorous DuoNeb's, IV Solu-Medrol, IV azithromycin for acute bronchitis,  follow-up O2 sats on ambulation (uses 2 liters at home), continue home medications for COPD. Has brown mucus  2. Coronary  artery disease status post CABG, stent, recent cardiac catheter 10 days ago, neg cardiac enzymes, continue aspirin, nitroglycerin, statin, furosemide.  3. Hypertension, stable on home medications. Continue same. 4. Diabetes mellitus type 2, stable on home medications. Continue same plus sliding scale insulin, follow blood sugars. 5. Hyperlipidemia, stable on statin. Continue same. 6. BPH, stable on home medications. Continue same. 7. GERD, stable on home medication. Continue same 8. Depression, stable on home medications. Continue same. 9. Chronic back pain: resume percocet and monitor.   Remove tele and ambulate. Await echo results.  All the records are reviewed and case discussed with Care Management/Social Worker. Management plans discussed with the patient, family and they are in agreement.  CODE STATUS: FULL CODE   TOTAL TIME TAKING CARE OF THIS PATIENT: 35 minutes.   More than 50% of the time was spent in counseling/coordination of care: YES  POSSIBLE D/C IN 1-2 DAYS, DEPENDING ON CLINICAL CONDITION.   [facility], Tokiko Diefenderfer M.D on 06/26/2015 at 9:38 AM  Between 7am to 6pm - Pager - [phone]  After 6pm go to [email or web address] - password [email or web address] [facility]  Office  [phone]  CC:  Primary care physician; [name], MD

## 2015-06-26 NOTE — H&P (Signed)
[facility] [place] NAME: Lawrence [name]    MR#:  [id number]  DATE OF BIRTH:  12-18-1955  DATE OF ADMISSION:  06/26/2015  PRIMARY CARE PHYSICIAN: [name], MD   REQUESTING/REFERRING PHYSICIAN: Dr. Beather Arbour  CHIEF COMPLAINT:   Chief Complaint  Patient presents with  . Shortness of Breath   cough with expectoration of brown sputum  HISTORY OF PRESENT ILLNESS:  Lawrence [name]  is a 59 y.o. male with a known history of COPD on home O2 2 L/m, coronary artery disease status post CABG, stent, recent cardiac catheterization, hypertension, diabetes mellitus type 2, BPH, depression presents to the emergency room with the complaints of ongoing shortness of breath for the past 2-3 weeks for which he has been at [facility] 2 in the last 2 weeks. Last night he experienced increasing shortness of breath hence came to the emergency room for further evaluation. Admits he has cough with expectoration of brown sputum, denies any fever, chills. Did complain of chest tightness because of shortness of breath and wheezing but otherwise no chest pain, palpitations, dizziness. Denies any nausea, vomiting, diarrhea, abdominal pain, dysuria.  Evaluation in the ED revealed orders of breath with wheezing and workup revealed normal BMP, BNP of 68, WBC 11.1, chest x-ray negative for consolidation or congestion. EKG normal sinus rhythm with ventricular rate of 75 bpm, PVCs with fusion complexes. 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 Of note, patient is gives a history of 4 pound weight gain in the last few days. Hospitalist service was consulted for further management. PAST MEDICAL HISTORY:   Past Medical History  Diagnosis Date  . Hypertension   . Coronary artery disease   . COPD (chronic obstructive pulmonary disease)   . Chronic bronchitis   . Emphysema lung   . Diabetes  mellitus without complication   . Asthma   . Celiac disease   . CHF (congestive heart failure)     PAST SURGICAL HISTORY:   Past Surgical History  Procedure Laterality Date  . Vascular surgery    . Bypass graft    . Coronary angioplasty with stent placement    . Tonsillectomy    . Cholecystectomy    . Esophageal dilation      SOCIAL HISTORY:   Social History  Substance Use Topics  . Smoking status: Former Research scientist (life sciences)  . Smokeless tobacco: Never Used  . Alcohol Use: No    FAMILY HISTORY:   Family History  Problem Relation Age of Onset  . Heart attack Mother   . Hypertension Mother   . COPD Mother   . Cirrhosis Mother   . Heart attack Father   . COPD Father   . Parkinson's disease Brother     DRUG ALLERGIES:   Allergies  Allergen Reactions  . Demerol [Meperidine] Hives  . Sulfa Antibiotics Hives    REVIEW OF SYSTEMS:   Review of Systems  Constitutional: Negative for fever, chills and malaise/fatigue.  HENT: Negative for ear pain, hearing loss, nosebleeds, sore throat and tinnitus.   Eyes: Negative for blurred vision, double vision, pain, discharge and redness.  Respiratory: Positive for cough, sputum production, shortness of breath and wheezing. Negative for hemoptysis.   Cardiovascular: Negative for chest pain, palpitations, orthopnea and leg swelling.       Chest tightness secondary to shortness of breath  Gastrointestinal: Negative for nausea, vomiting, abdominal pain, diarrhea, constipation, blood in stool and  melena.  Genitourinary: Negative for dysuria, urgency, frequency and hematuria.  Musculoskeletal: Negative for back pain, joint pain and neck pain.  Skin: Negative for itching and rash.  Neurological: Negative for dizziness, tingling, sensory change, focal weakness and seizures.  Endo/Heme/Allergies: Does not bruise/bleed easily.  Psychiatric/Behavioral: Negative for depression. The patient is not nervous/anxious.     MEDICATIONS AT HOME:   Prior  to Admission medications   Medication Sig Start Date End Date Taking? Authorizing Provider  acetaminophen (TYLENOL) 325 MG tablet Take 650 mg by mouth every 6 (six) hours as needed.   Yes Historical Provider, MD  ALPRAZolam Duanne Moron) 1 MG tablet TAKE 1/2-1 TABLET BY MOUTH 3 TIMES A DAY 05/22/15  Yes Birdie Sons, MD  aspirin EC 81 MG tablet Take 81 mg by mouth every morning.   Yes Historical Provider, MD  atorvastatin (LIPITOR) 80 MG tablet Take 1 tablet (80 mg total) by mouth at bedtime. 04/25/15  Yes Birdie Sons, MD  citalopram (CELEXA) 20 MG tablet Take 20 mg by mouth daily.   Yes Historical Provider, MD  clopidogrel (PLAVIX) 75 MG tablet TAKE ONE (1) TABLET EACH DAY 04/22/15  Yes Birdie Sons, MD  Fluticasone-Salmeterol (ADVAIR) 500-50 MCG/DOSE AEPB Inhale 1 puff into the lungs 2 (two) times daily.   Yes Historical Provider, MD  furosemide (LASIX) 40 MG tablet Take 40 mg by mouth every morning.   Yes Historical Provider, MD  hyoscyamine (LEVSIN SL) 0.125 MG SL tablet Place 0.125 mg under the tongue every 6 (six) hours.    Yes Historical Provider, MD  isosorbide mononitrate (IMDUR) 30 MG 24 hr tablet Take 60 mg by mouth every morning.    Yes Historical Provider, MD  levalbuterol [facility] HFA) 45 MCG/ACT inhaler Inhale 1-2 puffs into the lungs every 4 (four) hours as needed for shortness of breath.    Yes Historical Provider, MD  lisinopril (PRINIVIL,ZESTRIL) 20 MG tablet Take 20 mg by mouth daily.   Yes Historical Provider, MD  meloxicam (MOBIC) 7.5 MG tablet Take 1 tablet (7.5 mg total) by mouth daily. 06/03/15 06/02/16 Yes Marylene Land, NP  metFORMIN (GLUCOPHAGE) 500 MG tablet Take 1 tablet (500 mg total) by mouth every morning. 04/25/15  Yes Birdie Sons, MD  omega-3 acid ethyl esters (LOVAZA) 1 G capsule Take 4 g by mouth every morning.   Yes Historical Provider, MD  oxyCODONE-acetaminophen (PERCOCET) 10-325 MG per tablet Take 1 tablet by mouth every 4 (four) hours as needed for pain.  06/04/15  Yes Birdie Sons, MD  potassium chloride (K-DUR,KLOR-CON) 10 MEQ tablet Take 10 mEq by mouth every morning.   Yes Historical Provider, MD  predniSONE (DELTASONE) 20 MG tablet Take 2 tablets (40 mg total) by mouth daily. 06/17/15 06/16/16 Yes Nance Pear, MD  ranitidine (ZANTAC) 150 MG tablet Take 300 mg by mouth 2 (two) times daily.   Yes Historical Provider, MD  tamsulosin (FLOMAX) 0.4 MG CAPS capsule Take 0.4 mg by mouth daily.   Yes Historical Provider, MD  tiotropium (SPIRIVA) 18 MCG inhalation capsule Place 18 mcg into inhaler and inhale daily.   Yes Historical Provider, MD  cetirizine (ZYRTEC) 10 MG tablet Take 10 mg by mouth every morning.    Historical Provider, MD  lactulose (CHRONULAC) 10 GM/15ML solution Take 30 mLs (20 g total) by mouth daily as needed for mild constipation. 05/11/15   Paulette Blanch, MD  levofloxacin (LEVAQUIN) 750 MG tablet Take 1 tablet (750 mg total) by mouth daily. 05/17/15  Hinda Kehr, MD  magnesium citrate SOLN Take 296 mLs (1 Bottle total) by mouth once. 05/08/15   Joanne Gavel, MD  nitroGLYCERIN (NITROSTAT) 0.4 MG SL tablet Place 1 tablet (0.4 mg total) under the tongue every 5 (five) minutes as needed for chest pain. Every 5 minutes as needed for chest pain 04/26/15   Birdie Sons, MD  polyethylene glycol [facility] / GLYCOLAX) packet Take 17 g by mouth daily. 05/05/15   Earleen Newport, MD      VITAL SIGNS:  Blood pressure 174/97, pulse 50, temperature 97.5 F (36.4 C), temperature source Oral, resp. rate 12, height 5' 7"  (1.702 m), weight 100.245 kg (221 lb), SpO2 100 %.  PHYSICAL EXAMINATION:  Physical Exam  Constitutional: He is oriented to person, place, and time. He appears well-developed and well-nourished. No distress.  HENT:  Head: Normocephalic and atraumatic.  Right Ear: External ear normal.  Left Ear: External ear normal.  Nose: Nose normal.  Mouth/Throat: Oropharynx is clear and moist. No oropharyngeal exudate.  Eyes: EOM are  normal. Pupils are equal, round, and reactive to light. No scleral icterus.  Neck: Normal range of motion. Neck supple. No JVD present. No thyromegaly present.  Cardiovascular: Normal rate, regular rhythm, normal heart sounds and intact distal pulses.  Exam reveals no friction rub.   No murmur heard. Respiratory: Effort normal. No respiratory distress. He has wheezes. He has no rales. He exhibits no tenderness.  GI: Soft. Bowel sounds are normal. He exhibits no distension and no mass. There is no tenderness. There is no rebound and no guarding.  Musculoskeletal: Normal range of motion. He exhibits no edema.  Lymphadenopathy:    He has no cervical adenopathy.  Neurological: He is alert and oriented to person, place, and time. He has normal reflexes. He displays normal reflexes. No cranial nerve deficit. He exhibits normal muscle tone.  Skin: Skin is warm. No rash noted. No erythema.  Psychiatric: He has a normal mood and affect. His behavior is normal. Thought content normal.   LABORATORY PANEL:   CBC  Recent Labs Lab 06/25/15 2215  WBC 11.1*  HGB 14.2  HCT 44.5  PLT 211   ------------------------------------------------------------------------------------------------------------------  Chemistries   Recent Labs Lab 06/25/15 2215  NA 138  K 4.0  CL 103  CO2 27  GLUCOSE 170*  BUN 14  CREATININE 0.84  CALCIUM 9.5   ------------------------------------------------------------------------------------------------------------------  Cardiac Enzymes  Recent Labs Lab 06/25/15 2215  TROPONINI <0.03   ------------------------------------------------------------------------------------------------------------------  RADIOLOGY:  Dg Chest 2 View  06/25/2015   CLINICAL DATA:  Shortness of Breath  EXAM: CHEST  2 VIEW  COMPARISON:  June 16, 2015  FINDINGS: There is mild atelectasis in the left base. There is no edema or consolidation. Heart is upper normal in size with pulmonary  vascularity within normal limits. Patient is status post internal mammary bypass grafting. No adenopathy. There is degenerative change in the thoracic spine. There is mild anterior wedging of several lower thoracic vertebral bodies, stable.  IMPRESSION: Mild left base atelectasis. No edema or consolidation. No change in cardiac silhouette.   Electronically Signed   By: Lowella Grip III M.D.   On: 06/25/2015 22:36    EKG:   Orders placed or performed during the hospital encounter of 06/26/15  . ED EKG within 10 minutes  . ED EKG within 10 minutes  . EKG 12-Lead  . EKG 12-Lead  Normal sinus rhythm with ventricular rate of 74 bpm, PVCs with fusion complex.  IMPRESSION AND PLAN:   1. Shortness of breath secondary to COPD exacerbation secondary to possible acute bronchitis. Plan: Admit, continue O2 supplementation, vigorous DuoNeb's, IV Solu-Medrol, IV azithromycin for acute bronchitis, follow-up O2 sats, continue home medications for COPD. 2. Coronary artery disease status post CABG, stent, recent cardiac catheter 10 days ago. Chest tightness +, troponin 1 WNL, BNP 68, chest x-ray no congestion, EKG no acute ischemic changes. Plan: Telemetry monitoring, cycle cardiac enzymes, continue aspirin, nitroglycerin, statin, furosemide. Consider further workup accordingly. 3. Hypertension, stable on home medications. Continue same. 4. Diabetes mellitus type 2, stable on home medications. Continue same plus sliding scale insulin, follow blood sugars. 5. Hyperlipidemia, stable on statin. Continue same. 6. BPH, stable on home medications. Continue same. 7. GERD, stable on home medication. Continue same 8. Depression, stable on home medications. Continue same.    All the records are reviewed and case discussed with ED provider. Management plans discussed with the patient & in agreement.  CODE STATUS: full code  TOTAL TIME TAKING CARE OF THIS PATIENT: 50 minutes.    Azucena Freed N M.D on  06/26/2015 at 4:51 AM  Between 7am to 6pm - Pager - [phone]  After 6pm go to [email or web address] - password [email or web address] [facility]  Office  [phone]  CC: Primary care physician; [name], MD

## 2015-06-26 NOTE — ED Notes (Signed)
1st Attempt to call report to unit as bed ready indicates. Tiffany, RN stated she will call back for report in 52mn.

## 2015-06-26 NOTE — ED Provider Notes (Signed)
[facility] Emergency Department Provider Note  ____________________________________________  Time seen: Approximately 2:57 AM  I have reviewed the triage vital signs and the nursing notes.   HISTORY  Chief Complaint Shortness of Breath    HPI Lawrence [name] is a 59 y.o. male who presents to the ED from home with a chief complaint of shortness of breath. This is the patient's third visit to an emergency department since August 1 for similar issues. Patient recently had a cardiac catheter on July 26 at [facility] and since that time he has been experiencing shortness of breath. He was seen at our facility on August 1, felt better after DuoNeb treatment and followed up with his pulmonologist who performed PFTs and placed patient on oxygen. Patient was seen at the [facility] August 7, treated with 3 nebulizer treatments and started on prednisone. Patient returns to the ED tonight stating he has been using his Xopenex inhaler every 4 hours since August 7 with no relief of symptoms. He has noticed a 4 pound weight gain in the past 2 days despite following CHF dietary recommendations and noticed swelling to BLE. Does also complain of central chest tightness. Denies fever, chills, nausea, vomiting, diarrhea, headache, weakness.   Past Medical History  Diagnosis Date  . Hypertension   . Coronary artery disease   . COPD (chronic obstructive pulmonary disease)   . Chronic bronchitis   . Emphysema lung   . Diabetes mellitus without complication   . Asthma   . Celiac disease   . CHF (congestive heart failure)     There are no active problems to display for this patient.   Past Surgical History  Procedure Laterality Date  . Vascular surgery    . Bypass graft    . Coronary angioplasty with stent placement    . Tonsillectomy    . Cholecystectomy    . Esophageal dilation      Current Outpatient Rx  Name  Route  Sig  Dispense  Refill  . acetaminophen (TYLENOL) 325 MG  tablet   Oral   Take 650 mg by mouth every 6 (six) hours as needed.         . ALPRAZolam (XANAX) 1 MG tablet      TAKE 1/2-1 TABLET BY MOUTH 3 TIMES A DAY   90 tablet   3   . aspirin EC 81 MG tablet   Oral   Take 81 mg by mouth every morning.         Marland Kitchen atorvastatin (LIPITOR) 80 MG tablet   Oral   Take 1 tablet (80 mg total) by mouth at bedtime.   90 tablet   3   . citalopram (CELEXA) 20 MG tablet   Oral   Take 20 mg by mouth daily.         . clopidogrel (PLAVIX) 75 MG tablet      TAKE ONE (1) TABLET EACH DAY   30 tablet   5   . Fluticasone-Salmeterol (ADVAIR) 500-50 MCG/DOSE AEPB   Inhalation   Inhale 1 puff into the lungs 2 (two) times daily.         . furosemide (LASIX) 40 MG tablet   Oral   Take 40 mg by mouth every morning.         . hyoscyamine (LEVSIN SL) 0.125 MG SL tablet   Sublingual   Place 0.125 mg under the tongue every 6 (six) hours.          Marland Kitchen  isosorbide mononitrate (IMDUR) 30 MG 24 hr tablet   Oral   Take 60 mg by mouth every morning.          . levalbuterol (XOPENEX HFA) 45 MCG/ACT inhaler   Inhalation   Inhale 1-2 puffs into the lungs every 4 (four) hours as needed for shortness of breath.          . lisinopril (PRINIVIL,ZESTRIL) 20 MG tablet   Oral   Take 20 mg by mouth daily.         . meloxicam (MOBIC) 7.5 MG tablet   Oral   Take 1 tablet (7.5 mg total) by mouth daily.   10 tablet   0   . metFORMIN (GLUCOPHAGE) 500 MG tablet   Oral   Take 1 tablet (500 mg total) by mouth every morning.   90 tablet   4   . omega-3 acid ethyl esters (LOVAZA) 1 G capsule   Oral   Take 4 g by mouth every morning.         Marland Kitchen oxyCODONE-acetaminophen (PERCOCET) 10-325 MG per tablet   Oral   Take 1 tablet by mouth every 4 (four) hours as needed for pain.   150 tablet   0   . potassium chloride (K-DUR,KLOR-CON) 10 MEQ tablet   Oral   Take 10 mEq by mouth every morning.         . predniSONE (DELTASONE) 20 MG tablet    Oral   Take 2 tablets (40 mg total) by mouth daily.   8 tablet   0   . ranitidine (ZANTAC) 150 MG tablet   Oral   Take 300 mg by mouth 2 (two) times daily.         . tamsulosin (FLOMAX) 0.4 MG CAPS capsule   Oral   Take 0.4 mg by mouth daily.         Marland Kitchen tiotropium (SPIRIVA) 18 MCG inhalation capsule   Inhalation   Place 18 mcg into inhaler and inhale daily.         . cetirizine (ZYRTEC) 10 MG tablet   Oral   Take 10 mg by mouth every morning.         . lactulose (CHRONULAC) 10 GM/15ML solution   Oral   Take 30 mLs (20 g total) by mouth daily as needed for mild constipation.   120 mL   0   . levofloxacin (LEVAQUIN) 750 MG tablet   Oral   Take 1 tablet (750 mg total) by mouth daily.   5 tablet   0   . magnesium citrate SOLN   Oral   Take 296 mLs (1 Bottle total) by mouth once.   195 mL   0   . nitroGLYCERIN (NITROSTAT) 0.4 MG SL tablet   Sublingual   Place 1 tablet (0.4 mg total) under the tongue every 5 (five) minutes as needed for chest pain. Every 5 minutes as needed for chest pain   30 tablet   1   . polyethylene glycol (MIRALAX / GLYCOLAX) packet   Oral   Take 17 g by mouth daily.   14 each   0     Allergies Demerol and Sulfa antibiotics  History reviewed. No pertinent family history.  Social History Social History  Substance Use Topics  . Smoking status: Former Research scientist (life sciences)  . Smokeless tobacco: Never Used  . Alcohol Use: No    Review of Systems Constitutional: No fever/chills Eyes: No visual changes. ENT: No sore throat. Cardiovascular:  Positive for chest pain. Respiratory: Positive for shortness of breath. Gastrointestinal: No abdominal pain.  No nausea, no vomiting.  No diarrhea.  No constipation. Genitourinary: Negative for dysuria. Musculoskeletal: Negative for back pain. Skin: Negative for rash. Neurological: Negative for headaches, focal weakness or numbness.  10-point ROS otherwise  negative.  ____________________________________________   PHYSICAL EXAM:  VITAL SIGNS: ED Triage Vitals  Enc Vitals Group     BP 06/25/15 2206 156/89 mmHg     Pulse Rate 06/25/15 2206 75     Resp 06/25/15 2206 18     Temp 06/25/15 2206 97.7 F (36.5 C)     Temp Source 06/25/15 2206 Oral     SpO2 06/25/15 2206 96 %     Weight 06/25/15 2206 221 lb (100.245 kg)     Height 06/25/15 2206 5' 7"  (1.702 m)     Head Cir --      Peak Flow --      Pain Score 06/26/15 0253 10     Pain Loc --      Pain Edu? --      Excl. in [place]? --     Constitutional: Alert and oriented. Well appearing and in mild acute distress. Eyes: Conjunctivae are normal. PERRL. EOMI. Head: Atraumatic. Nose: No congestion/rhinnorhea. Mouth/Throat: Mucous membranes are moist.  Oropharynx non-erythematous. Neck: No stridor.   Cardiovascular: Normal rate, regular rhythm. Grossly normal heart sounds.  Good peripheral circulation. Respiratory: Normal respiratory effort.  No retractions. Lungs with wheezing and rhonchi bilateral bases. Gastrointestinal: Soft and nontender. No distention. No abdominal bruits. No CVA tenderness. Musculoskeletal: No lower extremity tenderness nor edema.  No joint effusions. Neurologic:  Normal speech and language. No gross focal neurologic deficits are appreciated. No gait instability. Skin:  Skin is warm, dry and intact. No rash noted. Psychiatric: Mood and affect are normal. Speech and behavior are normal.  ____________________________________________   LABS (all labs ordered are listed, but only abnormal results are displayed)  Labs Reviewed  BASIC METABOLIC PANEL - Abnormal; Notable for the following:    Glucose, Bld 170 (*)    All other components within normal limits  CBC - Abnormal; Notable for the following:    WBC 11.1 (*)    RDW 15.0 (*)    All other components within normal limits  TROPONIN I  BRAIN NATRIURETIC PEPTIDE    ____________________________________________  EKG  ED ECG REPORT I, Royalty Fakhouri J, the attending physician, personally viewed and interpreted this ECG.   Date: 06/26/2015  EKG Time: 2207  Rate: 75  Rhythm: normal EKG, normal sinus rhythm  Axis: Normal  Intervals:none  ST&T Change: Nonspecific  ____________________________________________  RADIOLOGY  Chest 2 view (viewed by me, interpreted per Dr. Jasmine December): Mild left base atelectasis. No edema or consolidation. No change in cardiac silhouette. ____________________________________________   PROCEDURES  Procedure(s) performed: None  Critical Care performed: No  ____________________________________________   INITIAL IMPRESSION / ASSESSMENT AND PLAN / ED COURSE  Pertinent labs & imaging results that were available during my care of the patient were reviewed by me and considered in my medical decision making (see chart for details).  59 year old male who presents for his third ED visit in the past 10 days for COPD exacerbation, currently on prednisone with audible wheezing, rhonchi as well as chest pain in a patient with CAD. Will initiate aspirin, IV Solu-Medrol, nebulizer treatments and discuss with hospitalist to evaluate in the ED for depression. ____________________________________________   FINAL CLINICAL IMPRESSION(S) / ED DIAGNOSES  Final diagnoses:  COPD exacerbation  Chest pain, unspecified chest pain type      Paulette Blanch, MD 06/26/15 0730

## 2015-06-27 LAB — GLUCOSE, CAPILLARY: Glucose-Capillary: 170 mg/dL — ABNORMAL HIGH (ref 65–99)

## 2015-06-27 LAB — BASIC METABOLIC PANEL
Anion gap: 8 (ref 5–15)
BUN: 18 mg/dL (ref 6–20)
CO2: 32 mmol/L (ref 22–32)
Calcium: 9.4 mg/dL (ref 8.9–10.3)
Chloride: 99 mmol/L — ABNORMAL LOW (ref 101–111)
Creatinine, Ser: 0.92 mg/dL (ref 0.61–1.24)
GFR calc Af Amer: >60 mL/min (ref >60)
GFR calc non Af Amer: >60 mL/min (ref >60)
Glucose, Bld: 263 mg/dL — ABNORMAL HIGH (ref 65–99)
Potassium: 4.6 mmol/L (ref 3.5–5.1)
Sodium: 139 mmol/L (ref 135–145)

## 2015-06-27 LAB — CBC
HCT: 44.7 % (ref 40.0–52.0)
Hemoglobin: 14.6 g/dL (ref 13.0–18.0)
MCH: 28.9 pg (ref 26.0–34.0)
MCHC: 32.7 g/dL (ref 32.0–36.0)
MCV: 88.4 fL (ref 80.0–100.0)
Platelets: 198 10*3/uL (ref 150–440)
RBC: 5.06 MIL/uL (ref 4.40–5.90)
RDW: 14.6 % — ABNORMAL HIGH (ref 11.5–14.5)
WBC: 15 10*3/uL — ABNORMAL HIGH (ref 3.8–10.6)

## 2015-06-27 MED ORDER — PREDNISONE 10 MG PO TABS: 40.0000 mg | ORAL_TABLET | Freq: Every day | ORAL | Status: DC

## 2015-06-27 MED ORDER — AZITHROMYCIN 500 MG PO TABS: 500.0000 mg | ORAL_TABLET | Freq: Every day | ORAL | Status: DC

## 2015-06-27 NOTE — Discharge Instructions (Signed)
Chronic Obstructive Pulmonary Disease Chronic obstructive pulmonary disease (COPD) is a common lung problem. In COPD, the flow of air from the lungs is limited. The way your lungs work will probably never return to normal, but there are things you can do to improve your lungs and make yourself feel better. HOME CARE  Take all medicines as told by your doctor.  Avoid medicines or cough syrups that dry up your airway (such as antihistamines) and do not allow you to get rid of thick spit. You do not need to avoid them if told differently by your doctor.  If you smoke, stop. Smoking makes the problem worse.  Avoid being around things that make your breathing worse (like smoke, chemicals, and fumes).  Use oxygen therapy and therapy to help improve your lungs (pulmonary rehabilitation) if told by your doctor. If you need home oxygen therapy, ask your doctor if you should buy a tool to measure your oxygen level (oximeter).  Avoid people who have a sickness you can catch (contagious).  Avoid going outside when it is very hot, cold, or humid.  Eat healthy foods. Eat smaller meals more often. Rest before meals.  Stay active, but remember to also rest.  Make sure to get all the shots (vaccines) your doctor recommends. Ask your doctor if you need a pneumonia shot.  Learn and use tips on how to relax.  Learn and use tips on how to control your breathing as told by your doctor. Try:  Breathing in (inhaling) through your nose for 1 second. Then, pucker your lips and breath out (exhale) through your lips for 2 seconds.  Putting one hand on your belly (abdomen). Breathe in slowly through your nose for 1 second. Your hand on your belly should move out. Pucker your lips and breathe out slowly through your lips. Your hand on your belly should move in as you breathe out.  Learn and use controlled coughing to clear thick spit from your lungs. The steps are: 1. Lean your head a little forward. 2. Breathe  in deeply. 3. Try to hold your breath for 3 seconds. 4. Keep your mouth slightly open while coughing 2 times. 5. Spit any thick spit out into a tissue. 6. Rest and do the steps again 1 or 2 times as needed. GET HELP IF:  You cough up more thick spit than usual.  There is a change in the color or thickness of the spit.  It is harder to breathe than usual.  Your breathing is faster than usual. GET HELP RIGHT AWAY IF:   You have shortness of breath while resting.  You have shortness of breath that stops you from:  Being able to talk.  Doing normal activities.  You chest hurts for longer than 5 minutes.  Your skin color is more blue than usual.  Your pulse oximeter shows that you have low oxygen for longer than 5 minutes. MAKE SURE YOU:   Understand these instructions.  Will watch your condition.  Will get help right away if you are not doing well or get worse. Document Released: 04/20/2008 Document Revised: 03/19/2014 Document Reviewed: 06/29/2013 Exit[employer] Patient Information 2015 [employer]. This information is not intended to replace advice given to you by your health care provider. Make sure you discuss any questions you have with your health care provider.  

## 2015-06-27 NOTE — Progress Notes (Signed)
Patient d/c'd home. Education provided, no questions at this time. Patient to drive himself home because he drove to hospital. Educated patient that son should pick up for safety, but patient refuses. Also stated that patient should have son bring oxygen tank even though he has not needed it for several days for safety. Patient also refuses, educated patient. O2 sats checked and are 98% on RA.  Wilnette Kales

## 2015-06-28 NOTE — Discharge Summary (Signed)
[facility] [place] NAME: Lawrence [name]    MR#:  [id number]  DATE OF BIRTH:  09-28-1956  DATE OF ADMISSION:  06/26/2015 ADMITTING PHYSICIAN: Juluis Mire, MD  DATE OF DISCHARGE: 06/27/2015 12:29 PM  PRIMARY CARE PHYSICIAN: [name], MD    ADMISSION DIAGNOSIS:  COPD exacerbation [J44.1] Chest pain, unspecified chest pain type [R07.9]  DISCHARGE DIAGNOSIS:  Principal Problem:   COPD exacerbation Active Problems:   CAD (coronary artery disease)   HTN (hypertension)   DM (diabetes mellitus)  SECONDARY DIAGNOSIS:   Past Medical History  Diagnosis Date  . Hypertension   . Coronary artery disease   . COPD (chronic obstructive pulmonary disease)   . Chronic bronchitis   . Emphysema lung   . Diabetes mellitus without complication   . Asthma   . Celiac disease   . CHF (congestive heart failure)    HOSPITAL COURSE:  58 y.o. male with a known history of COPD on home O2 2 L/m, coronary artery disease status post CABG, stent, recent cardiac catheterization, hypertension, diabetes mellitus type 2, BPH, depression admitted due to Shortness of breath secondary to COPD exacerbation secondary to possible acute bronchitis. Please see Dr Reddy's dictated H & P for further details. He responded well to IV steroids, Abx and Nebulizer breathing treatments and was feeling close to baseline by 11th Aug and was D/C home in stable condition.  He was agreeable with D/C plans. DISCHARGE CONDITIONS:  Stable CONSULTS OBTAINED:   NONE DRUG ALLERGIES:   Allergies  Allergen Reactions  . Demerol [Meperidine] Hives  . Sulfa Antibiotics Hives   DISCHARGE MEDICATIONS:   Discharge Medication List as of 06/27/2015 11:24 AM    START taking these medications   Details  azithromycin (ZITHROMAX) 500 MG tablet Take 1 tablet (500 mg total) by mouth daily., Starting 06/27/2015, Until Discontinued, Normal      CONTINUE these medications which  have CHANGED   Details  predniSONE (DELTASONE) 10 MG tablet Take 4 tablets (40 mg total) by mouth daily. Taper 10 mg daily until finish (40->30->20->10), Starting 06/27/2015, Until Fri 06/26/16, Print      CONTINUE these medications which have NOT CHANGED   Details  acetaminophen (TYLENOL) 325 MG tablet Take 650 mg by mouth every 6 (six) hours as needed., Until Discontinued, Historical Med    ALPRAZolam (XANAX) 1 MG tablet TAKE 1/2-1 TABLET BY MOUTH 3 TIMES A DAY, Print    aspirin EC 81 MG tablet Take 81 mg by mouth every morning., Until Discontinued, Historical Med    atorvastatin (LIPITOR) 80 MG tablet Take 1 tablet (80 mg total) by mouth at bedtime., Starting 04/25/2015, Until Discontinued, Normal    citalopram (CELEXA) 20 MG tablet Take 20 mg by mouth daily., Until Discontinued, Historical Med    clopidogrel (PLAVIX) 75 MG tablet TAKE ONE (1) TABLET EACH DAY, Normal    Fluticasone-Salmeterol (ADVAIR) 500-50 MCG/DOSE AEPB Inhale 1 puff into the lungs 2 (two) times daily., Until Discontinued, Historical Med    furosemide (LASIX) 40 MG tablet Take 40 mg by mouth every morning., Until Discontinued, Historical Med    hyoscyamine (LEVSIN SL) 0.125 MG SL tablet Place 0.125 mg under the tongue every 6 (six) hours. , Until Discontinued, Historical Med    isosorbide mononitrate (IMDUR) 30 MG 24 hr tablet Take 60 mg by mouth every morning. , Until Discontinued, Historical Med    levalbuterol (XOPENEX HFA) 45 MCG/ACT inhaler Inhale 1-2 puffs into the  lungs every 4 (four) hours as needed for shortness of breath. , Until Discontinued, Historical Med    lisinopril (PRINIVIL,ZESTRIL) 20 MG tablet Take 20 mg by mouth daily., Until Discontinued, Historical Med    meloxicam (MOBIC) 7.5 MG tablet Take 1 tablet (7.5 mg total) by mouth daily., Starting 06/03/2015, Until Tue 06/02/16, Print    metFORMIN (GLUCOPHAGE) 500 MG tablet Take 1 tablet (500 mg total) by mouth every morning., Starting 04/25/2015, Until  Discontinued, Normal    omega-3 acid ethyl esters (LOVAZA) 1 G capsule Take 4 g by mouth every morning., Until Discontinued, Historical Med    oxyCODONE-acetaminophen (PERCOCET) 10-325 MG per tablet Take 1 tablet by mouth every 4 (four) hours as needed for pain., Starting 06/04/2015, Until Discontinued, Print    potassium chloride (K-DUR,KLOR-CON) 10 MEQ tablet Take 10 mEq by mouth every morning., Until Discontinued, Historical Med    ranitidine (ZANTAC) 150 MG tablet Take 300 mg by mouth 2 (two) times daily., Until Discontinued, Historical Med    tamsulosin (FLOMAX) 0.4 MG CAPS capsule Take 0.4 mg by mouth daily., Until Discontinued, Historical Med    tiotropium (SPIRIVA) 18 MCG inhalation capsule Place 18 mcg into inhaler and inhale daily., Until Discontinued, Historical Med    cetirizine (ZYRTEC) 10 MG tablet Take 10 mg by mouth every morning., Until Discontinued, Historical Med    lactulose (CHRONULAC) 10 GM/15ML solution Take 30 mLs (20 g total) by mouth daily as needed for mild constipation., Starting 05/11/2015, Until Discontinued, Print    levofloxacin (LEVAQUIN) 750 MG tablet Take 1 tablet (750 mg total) by mouth daily., Starting 05/17/2015, Until Discontinued, Print    magnesium citrate SOLN Take 296 mLs (1 Bottle total) by mouth once., Starting 05/08/2015, Print    nitroGLYCERIN (NITROSTAT) 0.4 MG SL tablet Place 1 tablet (0.4 mg total) under the tongue every 5 (five) minutes as needed for chest pain. Every 5 minutes as needed for chest pain, Starting 04/26/2015, Until Discontinued, Normal    polyethylene glycol (MIRALAX / GLYCOLAX) packet Take 17 g by mouth daily., Starting 05/05/2015, Until Discontinued, Print       DISCHARGE INSTRUCTIONS:   DIET:  Cardiac diet  DISCHARGE CONDITION:  Good  ACTIVITY:  Activity as tolerated  OXYGEN:  Home Oxygen: No.   Oxygen Delivery: room air  DISCHARGE LOCATION:  home   If you experience worsening of your admission symptoms,  develop shortness of breath, life threatening emergency, suicidal or homicidal thoughts you must seek medical attention immediately by calling 911 or calling your MD immediately  if symptoms less severe.  You Must read complete instructions/literature along with all the possible adverse reactions/side effects for all the Medicines you take and that have been prescribed to you. Take any new Medicines after you have completely understood and accpet all the possible adverse reactions/side effects.   Please note  You were cared for by a hospitalist during your hospital stay. If you have any questions about your discharge medications or the care you received while you were in the hospital after you are discharged, you can call the unit and asked to speak with the hospitalist on call if the hospitalist that took care of you is not available. Once you are discharged, your primary care physician will handle any further medical issues. Please note that NO REFILLS for any discharge medications will be authorized once you are discharged, as it is imperative that you return to your primary care physician (or establish a relationship with a primary care physician  if you do not have one) for your aftercare needs so that they can reassess your need for medications and monitor your lab values.    On the day of Discharge:  VITAL SIGNS:  Blood pressure 144/77, pulse 55, temperature 97.9 F (36.6 C), temperature source Oral, resp. rate 20, height 5' 7"  (1.702 m), weight 97.07 kg (214 lb), SpO2 92 %. PHYSICAL EXAMINATION:  GENERAL:  59 y.o.-year-old patient lying in the bed with no acute distress.  EYES: Pupils equal, round, reactive to light and accommodation. No scleral icterus. Extraocular muscles intact.  HEENT: Head atraumatic, normocephalic. Oropharynx and nasopharynx clear.  NECK:  Supple, no jugular venous distention. No thyroid enlargement, no tenderness.  LUNGS: Normal breath sounds bilaterally, no  wheezing, rales,rhonchi or crepitation. No use of accessory muscles of respiration.  CARDIOVASCULAR: S1, S2 normal. No murmurs, rubs, or gallops.  ABDOMEN: Soft, non-tender, non-distended. Bowel sounds present. No organomegaly or mass.  EXTREMITIES: No pedal edema, cyanosis, or clubbing.  NEUROLOGIC: Cranial nerves II through XII are intact. Muscle strength 5/5 in all extremities. Sensation intact. Gait not checked.  PSYCHIATRIC: The patient is alert and oriented x 3.  SKIN: No obvious rash, lesion, or ulcer.  DATA REVIEW:   CBC  Recent Labs Lab 06/27/15 0430  WBC 15.0*  HGB 14.6  HCT 44.7  PLT 198    Chemistries   Recent Labs Lab 06/27/15 0430  NA 139  K 4.6  CL 99*  CO2 32  GLUCOSE 263*  BUN 18  CREATININE 0.92  CALCIUM 9.4    Management plans discussed with the patient, family and they are in agreement.  CODE STATUS: Full Code  TOTAL TIME TAKING CARE OF THIS PATIENT: 55 minutes.    [facility], Juaquina Machnik M.D on 06/28/2015 at 11:21 PM  Between 7am to 6pm - Pager - [phone]  After 6pm go to [email or web address] - password [email or web address] [facility]  Office  [phone]  CC: Primary care physician; [name], MD

## 2015-06-30 ENCOUNTER — Emergency Department
Admission: EM | Admit: 2015-06-30 | Discharge: 2015-07-01 | Disposition: A | Discharge status: Home / Self Care | Payer: Medicare PPO | Attending: Emergency Medicine | Admitting: Emergency Medicine

## 2015-06-30 ENCOUNTER — Other Ambulatory Visit: Payer: Self-pay

## 2015-06-30 ENCOUNTER — Emergency Department: Payer: Medicare PPO

## 2015-06-30 ENCOUNTER — Encounter: Payer: Self-pay | Admitting: Emergency Medicine

## 2015-06-30 DIAGNOSIS — Z7951 Long term (current) use of inhaled steroids: Secondary | ICD-10-CM | POA: Insufficient documentation

## 2015-06-30 DIAGNOSIS — J441 Chronic obstructive pulmonary disease with (acute) exacerbation: Secondary | ICD-10-CM | POA: Insufficient documentation

## 2015-06-30 DIAGNOSIS — R0602 Shortness of breath: Secondary | ICD-10-CM | POA: Diagnosis present

## 2015-06-30 DIAGNOSIS — I10 Essential (primary) hypertension: Secondary | ICD-10-CM | POA: Diagnosis not present

## 2015-06-30 DIAGNOSIS — Z792 Long term (current) use of antibiotics: Secondary | ICD-10-CM | POA: Diagnosis not present

## 2015-06-30 DIAGNOSIS — Z79899 Other long term (current) drug therapy: Secondary | ICD-10-CM | POA: Insufficient documentation

## 2015-06-30 DIAGNOSIS — Z7902 Long term (current) use of antithrombotics/antiplatelets: Secondary | ICD-10-CM | POA: Diagnosis not present

## 2015-06-30 DIAGNOSIS — Z7982 Long term (current) use of aspirin: Secondary | ICD-10-CM | POA: Diagnosis not present

## 2015-06-30 DIAGNOSIS — Z87891 Personal history of nicotine dependence: Secondary | ICD-10-CM | POA: Insufficient documentation

## 2015-06-30 LAB — CBC
HCT: 45.9 % (ref 40.0–52.0)
Hemoglobin: 15 g/dL (ref 13.0–18.0)
MCH: 29.1 pg (ref 26.0–34.0)
MCHC: 32.7 g/dL (ref 32.0–36.0)
MCV: 88.8 fL (ref 80.0–100.0)
Platelets: 202 10*3/uL (ref 150–440)
RBC: 5.17 MIL/uL (ref 4.40–5.90)
RDW: 15.3 % — ABNORMAL HIGH (ref 11.5–14.5)
WBC: 9.9 10*3/uL (ref 3.8–10.6)

## 2015-06-30 LAB — BASIC METABOLIC PANEL
Anion gap: 8 (ref 5–15)
BUN: 17 mg/dL (ref 6–20)
CO2: 30 mmol/L (ref 22–32)
Calcium: 9 mg/dL (ref 8.9–10.3)
Chloride: 105 mmol/L (ref 101–111)
Creatinine, Ser: 1.02 mg/dL (ref 0.61–1.24)
GFR calc Af Amer: >60 mL/min (ref >60)
GFR calc non Af Amer: >60 mL/min (ref >60)
Glucose, Bld: 89 mg/dL (ref 65–99)
Potassium: 4 mmol/L (ref 3.5–5.1)
Sodium: 143 mmol/L (ref 135–145)

## 2015-06-30 LAB — TROPONIN I: Troponin I: <0.03 ng/mL (ref <0.031)

## 2015-06-30 MED ORDER — METHYLPREDNISOLONE SODIUM SUCC 125 MG IJ SOLR
125.0000 mg | Freq: Once | INTRAMUSCULAR | Status: AC
  Administered 2015-06-30: 125 mg via INTRAVENOUS
  Filled 2015-06-30: qty 2

## 2015-06-30 MED ORDER — IPRATROPIUM-ALBUTEROL 0.5-2.5 (3) MG/3ML IN SOLN
3.0000 mL | Freq: Once | RESPIRATORY_TRACT | Status: AC
  Administered 2015-06-30: 3 mL via RESPIRATORY_TRACT
  Filled 2015-06-30: qty 3

## 2015-06-30 NOTE — ED Provider Notes (Signed)
[facility] Emergency Department Provider Note  ____________________________________________  Time seen: Approximately 11:06 PM  I have reviewed the triage vital signs and the nursing notes.   HISTORY  Chief Complaint Shortness of Breath    HPI Lawrence [name] is a 59 y.o. male who presents to the ED from home with a chief complaint of shortness of breath and chest pain. Patient was most recently admitted to the hospital 8/10-8/11 for COPD exacerbation; he was seen in the ED at 2 different facilities 3 times total since 8/1 for same. Patient returns to the ER complaining of shortness of breath, onset today, accompanied by chest tightness. Last nebulizer treatment 1 PM. States he has finished his azithromycin, and has 1 remaining dose of prednisone. Complains of cough productive of rusty sputum. Patient wears 2 L nasal cannula oxygen continuously. Denies fever, chills, nausea, vomiting, diarrhea, constipation, headache, weakness.   Past Medical History  Diagnosis Date  . Hypertension   . Coronary artery disease   . COPD (chronic obstructive pulmonary disease)   . Chronic bronchitis   . Emphysema lung   . Diabetes mellitus without complication   . Asthma   . Celiac disease   . CHF (congestive heart failure)     Patient Active Problem List   Diagnosis Date Noted  . COPD exacerbation 06/26/2015  . CAD (coronary artery disease) 06/26/2015  . HTN (hypertension) 06/26/2015  . DM (diabetes mellitus) 06/26/2015    Past Surgical History  Procedure Laterality Date  . Vascular surgery    . Bypass graft    . Coronary angioplasty with stent placement    . Tonsillectomy    . Cholecystectomy    . Esophageal dilation      Current Outpatient Rx  Name  Route  Sig  Dispense  Refill  . acetaminophen (TYLENOL) 325 MG tablet   Oral   Take 650 mg by mouth every 6 (six) hours as needed.         . ALPRAZolam (XANAX) 1 MG tablet      TAKE 1/2-1 TABLET BY MOUTH  3 TIMES A DAY   90 tablet   3   . aspirin EC 81 MG tablet   Oral   Take 81 mg by mouth every morning.         Marland Kitchen atorvastatin (LIPITOR) 80 MG tablet   Oral   Take 1 tablet (80 mg total) by mouth at bedtime.   90 tablet   3   . azithromycin (ZITHROMAX) 500 MG tablet   Oral   Take 1 tablet (500 mg total) by mouth daily.   5 tablet   0   . cetirizine (ZYRTEC) 10 MG tablet   Oral   Take 10 mg by mouth every morning.         . citalopram (CELEXA) 20 MG tablet   Oral   Take 20 mg by mouth daily.         . clopidogrel (PLAVIX) 75 MG tablet      TAKE ONE (1) TABLET EACH DAY   30 tablet   5   . Fluticasone-Salmeterol (ADVAIR) 500-50 MCG/DOSE AEPB   Inhalation   Inhale 1 puff into the lungs 2 (two) times daily.         . furosemide (LASIX) 40 MG tablet   Oral   Take 40 mg by mouth every morning.         . hyoscyamine (LEVSIN SL) 0.125 MG SL tablet   Sublingual  Place 0.125 mg under the tongue every 6 (six) hours.          . isosorbide mononitrate (IMDUR) 30 MG 24 hr tablet   Oral   Take 60 mg by mouth every morning.          . lactulose (CHRONULAC) 10 GM/15ML solution   Oral   Take 30 mLs (20 g total) by mouth daily as needed for mild constipation.   120 mL   0   . levalbuterol (XOPENEX HFA) 45 MCG/ACT inhaler   Inhalation   Inhale 1-2 puffs into the lungs every 4 (four) hours as needed for shortness of breath.          . levofloxacin (LEVAQUIN) 750 MG tablet   Oral   Take 1 tablet (750 mg total) by mouth daily.   5 tablet   0   . lisinopril (PRINIVIL,ZESTRIL) 20 MG tablet   Oral   Take 20 mg by mouth daily.         . magnesium citrate SOLN   Oral   Take 296 mLs (1 Bottle total) by mouth once.   195 mL   0   . meloxicam (MOBIC) 7.5 MG tablet   Oral   Take 1 tablet (7.5 mg total) by mouth daily.   10 tablet   0   . metFORMIN (GLUCOPHAGE) 500 MG tablet   Oral   Take 1 tablet (500 mg total) by mouth every morning.   90 tablet    4   . nitroGLYCERIN (NITROSTAT) 0.4 MG SL tablet   Sublingual   Place 1 tablet (0.4 mg total) under the tongue every 5 (five) minutes as needed for chest pain. Every 5 minutes as needed for chest pain   30 tablet   1   . omega-3 acid ethyl esters (LOVAZA) 1 G capsule   Oral   Take 4 g by mouth every morning.         Marland Kitchen oxyCODONE-acetaminophen (PERCOCET) 10-325 MG per tablet   Oral   Take 1 tablet by mouth every 4 (four) hours as needed for pain.   150 tablet   0   . polyethylene glycol (MIRALAX / GLYCOLAX) packet   Oral   Take 17 g by mouth daily.   14 each   0   . potassium chloride (K-DUR,KLOR-CON) 10 MEQ tablet   Oral   Take 10 mEq by mouth every morning.         . predniSONE (DELTASONE) 10 MG tablet   Oral   Take 4 tablets (40 mg total) by mouth daily. Taper 10 mg daily until finish (40->30->20->10)   10 tablet   0   . ranitidine (ZANTAC) 150 MG tablet   Oral   Take 300 mg by mouth 2 (two) times daily.         . tamsulosin (FLOMAX) 0.4 MG CAPS capsule   Oral   Take 0.4 mg by mouth daily.         Marland Kitchen tiotropium (SPIRIVA) 18 MCG inhalation capsule   Inhalation   Place 18 mcg into inhaler and inhale daily.           Allergies Demerol and Sulfa antibiotics  Family History  Problem Relation Age of Onset  . Heart attack Mother   . Hypertension Mother   . COPD Mother   . Cirrhosis Mother   . Heart attack Father   . COPD Father   . Parkinson's disease Brother  Social History Social History  Substance Use Topics  . Smoking status: Former Research scientist (life sciences)  . Smokeless tobacco: Never Used  . Alcohol Use: No    Review of Systems Constitutional: No fever/chills Eyes: No visual changes. ENT: No sore throat. Cardiovascular: Positive for chest pain. Respiratory: Positive for shortness of breath. Gastrointestinal: No abdominal pain.  No nausea, no vomiting.  No diarrhea.  No constipation. Genitourinary: Negative for dysuria. Musculoskeletal: Negative  for back pain. Skin: Negative for rash. Neurological: Negative for headaches, focal weakness or numbness.  10-point ROS otherwise negative.  ____________________________________________   PHYSICAL EXAM:  VITAL SIGNS: ED Triage Vitals  Enc Vitals Group     BP 06/30/15 2028 158/77 mmHg     Pulse Rate 06/30/15 2028 56     Resp 06/30/15 2028 18     Temp 06/30/15 2028 98 F (36.7 C)     Temp Source 06/30/15 2028 Oral     SpO2 06/30/15 2028 96 %     Weight 06/30/15 2023 217 lb (98.431 kg)     Height 06/30/15 2023 5' 7"  (1.702 m)     Head Cir --      Peak Flow --      Pain Score 06/30/15 2024 9     Pain Loc --      Pain Edu? --      Excl. in [place]? --     Constitutional: Alert and oriented. Well appearing and in no acute distress. Eyes: Conjunctivae are normal. PERRL. EOMI. Head: Atraumatic. Nose: No congestion/rhinnorhea. Mouth/Throat: Mucous membranes are moist.  Oropharynx non-erythematous. Neck: No stridor.   Cardiovascular: Normal rate, regular rhythm. Grossly normal heart sounds.  Good peripheral circulation. Respiratory: Normal respiratory effort.  No retractions. Lungs slightly diminished bibasilar; occasional scattered wheeze noted. Gastrointestinal: Soft and nontender. No distention. No abdominal bruits. No CVA tenderness. Musculoskeletal: No lower extremity tenderness nor edema.  No joint effusions. Neurologic:  Normal speech and language. No gross focal neurologic deficits are appreciated. No gait instability. Skin:  Skin is warm, dry and intact. No rash noted. Psychiatric: Mood and affect are normal. Speech and behavior are normal.  ____________________________________________   LABS (all labs ordered are listed, but only abnormal results are displayed)  Labs Reviewed  CBC - Abnormal; Notable for the following:    RDW 15.3 (*)    All other components within normal limits  BASIC METABOLIC PANEL  TROPONIN I    ____________________________________________  EKG  ED ECG REPORT I, Shawnn Bouillon J, the attending physician, personally viewed and interpreted this ECG.   Date: 06/30/2015  EKG Time: 2027  Rate: 56  Rhythm: sinus bradycardia  Axis: Normal  Intervals:none  ST&T Change: Nonspecific  ____________________________________________  RADIOLOGY  Chest 2 view (viewed by me, interpreted per Dr. Jobe Igo): 1. Mild left base atelectasis. 2. Low lung volumes and mild pulmonary vascular congestion. 3. No other acute cardiopulmonary disease.  ____________________________________________   PROCEDURES  Procedure(s) performed: None  Critical Care performed: No  ____________________________________________   INITIAL IMPRESSION / ASSESSMENT AND PLAN / ED COURSE  Pertinent labs & imaging results that were available during my care of the patient were reviewed by me and considered in my medical decision making (see chart for details).  59 year old male who presents with mild COPD exacerbation; will initiate nebulizer treatment, IV Solu-Medrol and reassess.  ----------------------------------------- 12:29 AM on 07/01/2015 -----------------------------------------  Lungs clear to auscultation s/p DuoNeb treatment. Patient feeling better. Requesting something for his chronic back pain. I have discussed with  him will give him 1 dose of IV morphine but will not write a prescription. Patient is understanding of this. Will continue patient on prednisone burst for an additional 4 days. Strict return precautions given. Patient verbalizes understanding and agrees with plan of care. ____________________________________________   FINAL CLINICAL IMPRESSION(S) / ED DIAGNOSES  Final diagnoses:  COPD exacerbation      Paulette Blanch, MD 07/01/15 [phone]

## 2015-06-30 NOTE — ED Notes (Signed)
Chest pain and shob since yest was discharged from hospital for copd thursday

## 2015-07-01 ENCOUNTER — Other Ambulatory Visit: Payer: Self-pay | Admitting: Family Medicine

## 2015-07-01 DIAGNOSIS — J441 Chronic obstructive pulmonary disease with (acute) exacerbation: Secondary | ICD-10-CM | POA: Diagnosis not present

## 2015-07-01 MED ORDER — PREDNISONE 20 MG PO TABS: ORAL_TABLET | ORAL | Status: DC

## 2015-07-01 MED ORDER — ONDANSETRON HCL 4 MG/2ML IJ SOLN
4.0000 mg | Freq: Once | INTRAMUSCULAR | Status: AC
Start: 2015-07-01 — End: 2015-07-01
  Administered 2015-07-01: 4 mg via INTRAVENOUS
  Filled 2015-07-01: qty 2

## 2015-07-01 MED ORDER — MORPHINE SULFATE 4 MG/ML IJ SOLN
4.0000 mg | Freq: Once | INTRAMUSCULAR | Status: AC
  Administered 2015-07-01: 4 mg via INTRAVENOUS
  Filled 2015-07-01: qty 1

## 2015-07-01 NOTE — Discharge Instructions (Signed)
1. Take prednisone 60 mg daily for the next 4 days; start next dose on Tuesday, August 16. 2. Use your nebulizers every 4 hours as needed for wheezing. 3. Return to the ER for worsening symptoms, persistent vomiting, difficulty breathing or other concerns.  Chronic Obstructive Pulmonary Disease Exacerbation Chronic obstructive pulmonary disease (COPD) is a common lung condition in which airflow from the lungs is limited. COPD is a general term that can be used to describe many different lung problems that limit airflow, including chronic bronchitis and emphysema. COPD exacerbations are episodes when breathing symptoms become much worse and require extra treatment. Without treatment, COPD exacerbations can be life threatening, and frequent COPD exacerbations can cause further damage to your lungs. CAUSES   Respiratory infections.   Exposure to smoke.   Exposure to air pollution, chemical fumes, or dust. Sometimes there is no apparent cause or trigger. RISK FACTORS  Smoking cigarettes.  Older age.  Frequent prior COPD exacerbations. SIGNS AND SYMPTOMS   Increased coughing.   Increased thick spit (sputum) production.   Increased wheezing.   Increased shortness of breath.   Rapid breathing.   Chest tightness. DIAGNOSIS  Your medical history, a physical exam, and tests will help your health care provider make a diagnosis. Tests may include:  A chest X-ray.  Basic lab tests.  Sputum testing.  An arterial blood gas test. TREATMENT  Depending on the severity of your COPD exacerbation, you may need to be admitted to a hospital for treatment. Some of the treatments commonly used to treat COPD exacerbations are:   Antibiotic medicines.   Bronchodilators. These are drugs that expand the air passages. They may be given with an inhaler or nebulizer. Spacer devices may be needed to help improve drug delivery.  Corticosteroid medicines.  Supplemental oxygen therapy.   HOME CARE INSTRUCTIONS   Do not smoke. Quitting smoking is very important to prevent COPD from getting worse and exacerbations from happening as often.  Avoid exposure to all substances that irritate the airway, especially to tobacco smoke.   If you were prescribed an antibiotic medicine, finish it all even if you start to feel better.  Take all medicines as directed by your health care provider.It is important to use correct technique with inhaled medicines.  Drink enough fluids to keep your urine clear or pale yellow (unless you have a medical condition that requires fluid restriction).  Use a cool mist vaporizer. This makes it easier to clear your chest when you cough.   If you have a home nebulizer and oxygen, continue to use them as directed.   Maintain all necessary vaccinations to prevent infections.   Exercise regularly.   Eat a healthy diet.   Keep all follow-up appointments as directed by your health care provider. SEEK IMMEDIATE MEDICAL CARE IF:  You have worsening shortness of breath.   You have trouble talking.   You have severe chest pain.  You have blood in your sputum.  You have a fever.  You have weakness, vomit repeatedly, or faint.   You feel confused.   You continue to get worse. MAKE SURE YOU:   Understand these instructions.  Will watch your condition.  Will get help right away if you are not doing well or get worse. Document Released: 08/30/2007 Document Revised: 03/19/2014 Document Reviewed: 07/07/2013 [facility] Patient Information 2015 [place], [place]. This information is not intended to replace advice given to you by your health care provider. Make sure you discuss any questions you have  with your health care provider.

## 2015-07-03 ENCOUNTER — Ambulatory Visit (INDEPENDENT_AMBULATORY_CARE_PROVIDER_SITE_OTHER): Payer: Medicare PPO | Admitting: Family Medicine

## 2015-07-03 ENCOUNTER — Encounter: Payer: Self-pay | Admitting: Family Medicine

## 2015-07-03 VITALS — BP 122/76 | HR 135 | Temp 97.6°F | Resp 16 | Wt 223.0 lb

## 2015-07-03 DIAGNOSIS — J441 Chronic obstructive pulmonary disease with (acute) exacerbation: Secondary | ICD-10-CM | POA: Diagnosis not present

## 2015-07-03 DIAGNOSIS — I10 Essential (primary) hypertension: Secondary | ICD-10-CM | POA: Diagnosis not present

## 2015-07-03 DIAGNOSIS — J449 Chronic obstructive pulmonary disease, unspecified: Secondary | ICD-10-CM | POA: Diagnosis not present

## 2015-07-03 DIAGNOSIS — I2510 Atherosclerotic heart disease of native coronary artery without angina pectoris: Secondary | ICD-10-CM

## 2015-07-03 DIAGNOSIS — I2583 Coronary atherosclerosis due to lipid rich plaque: Secondary | ICD-10-CM

## 2015-07-03 MED ORDER — ALBUTEROL SULFATE HFA 108 (90 BASE) MCG/ACT IN AERS: 2.0000 | INHALATION_SPRAY | Freq: Four times a day (QID) | RESPIRATORY_TRACT | Status: DC | PRN

## 2015-07-03 MED ORDER — OXYCODONE-ACETAMINOPHEN 10-325 MG PO TABS: 1.0000 | ORAL_TABLET | ORAL | Status: DC | PRN

## 2015-07-03 NOTE — Progress Notes (Signed)
Patient: Lawrence [name] Male    DOB: 14-Nov-1956   59 y.o.   MRN: [id number] Visit Date: 07/03/2015  Today's Provider: [name], MD   Chief Complaint  Patient presents with  . COPD   Subjective:    HPI  Follow up ER visit/ Hospital follow up  Patient was seen in ER for shortness of breath and chest tightness on 06/26/2015. Patient was admitted on 06/26/2015 and discharged on 06/27/2015. Patient returned to the ER on 06/30/2015 for an another evaluation.  He was treated for COPD Exacerbation. Treatment for this included  DuoNeb. Patient was also given 1 dose of IV Morphine for back pain. Patient was discharged home and was to continue on Prednisone burst for an additional 4 days. He reports good compliance with treatment. He reports this condition is Improved. Patient has one round of Prednisone left to take (patient states he will take this dose in the morning). Patient has an appointment to follow up with Dr. Humphrey Rolls on 07/23/2015.  He states he had been doing well until he presented to [facility] last month with chest pain and was admitted for heart cath, when he started having shortness of breath and hypoxia. He had been off of oxygen for a few years, but was started back when he was in [place]. He states he was given albuterol instead of Xopenex, and that albuterol opened up his airways better and he tolerated fairly well.   ------------------------------------------------------------------------------------     Allergies  Allergen Reactions  . Albuterol Sulfate [Albuterol]     *ANTIASTHMATIC AND BRONCHODILATOR AGENTS*  . Demerol [Meperidine] Hives  . Sulfa Antibiotics Hives  . Morphine Sulfate Rash    Nausea and vomiting   Previous Medications   ALPRAZOLAM (XANAX) 1 MG TABLET    TAKE 1/2-1 TABLET BY MOUTH 3 TIMES A DAY   ASPIRIN EC 81 MG TABLET    Take 81 mg by mouth every morning.   ATORVASTATIN (LIPITOR) 80 MG TABLET    Take 1 tablet (80 mg total) by mouth at bedtime.   CARVEDILOL (COREG) 12.5 MG TABLET    Take 1 tablet by mouth 2 (two) times daily.   CETIRIZINE (ZYRTEC) 10 MG TABLET    Take 10 mg by mouth every morning.   CITALOPRAM (CELEXA) 20 MG TABLET    Take 20 mg by mouth daily.   CLOPIDOGREL (PLAVIX) 75 MG TABLET    TAKE ONE (1) TABLET EACH DAY   FLUTICASONE-SALMETEROL (ADVAIR) 500-50 MCG/DOSE AEPB    Inhale 1 puff into the lungs 2 (two) times daily.   FUROSEMIDE (LASIX) 40 MG TABLET    Take 40 mg by mouth every morning.   HYOSCYAMINE (LEVSIN SL) 0.125 MG SL TABLET    Place 0.125 mg under the tongue every 6 (six) hours.    ISOSORBIDE MONONITRATE (IMDUR) 60 MG 24 HR TABLET    Take 60 mg by mouth daily.    LANSOPRAZOLE (PREVACID) 30 MG CAPSULE       LEVALBUTEROL (XOPENEX HFA) 45 MCG/ACT INHALER    Inhale 1-2 puffs into the lungs every 4 (four) hours as needed for shortness of breath.    LISINOPRIL (PRINIVIL,ZESTRIL) 20 MG TABLET    Take 20 mg by mouth daily.   MAGNESIUM CITRATE SOLN    Take 296 mLs (1 Bottle total) by mouth once.   MELOXICAM (MOBIC) 7.5 MG TABLET    Take 1 tablet (7.5 mg total) by mouth daily.   METFORMIN (GLUCOPHAGE) 500  MG TABLET    Take 1 tablet (500 mg total) by mouth every morning.   NITROGLYCERIN (NITROSTAT) 0.4 MG SL TABLET    Place 1 tablet (0.4 mg total) under the tongue every 5 (five) minutes as needed for chest pain. Every 5 minutes as needed for chest pain   OMEGA-3 ACID ETHYL ESTERS (LOVAZA) 1 G CAPSULE    TAKE FOUR CAPSULES BY MOUTH DAILY   ONDANSETRON (ZOFRAN-ODT) 4 MG DISINTEGRATING TABLET    Take 4 mg by mouth every 8 (eight) hours as needed.    OXYCODONE-ACETAMINOPHEN (PERCOCET) 10-325 MG PER TABLET    Take 1 tablet by mouth every 4 (four) hours as needed for pain.   POLYETHYLENE GLYCOL (MIRALAX / GLYCOLAX) PACKET    Take 17 g by mouth daily.   POTASSIUM CHLORIDE (K-DUR,KLOR-CON) 10 MEQ TABLET    Take 10 mEq by mouth every morning.   PREDNISONE (DELTASONE) 20 MG TABLET    3 tablets daily x 4 days   TAMSULOSIN (FLOMAX) 0.4  MG CAPS CAPSULE    Take 0.4 mg by mouth daily.   TIOTROPIUM (SPIRIVA) 18 MCG INHALATION CAPSULE    Place 18 mcg into inhaler and inhale daily.    Review of Systems  Constitutional: Positive for appetite change (decrease in appetite). Negative for fever, chills, diaphoresis and fatigue.  Respiratory: Positive for cough, chest tightness (with exessive movement), shortness of breath and wheezing.   Cardiovascular: Positive for leg swelling. Negative for chest pain and palpitations.  All other systems reviewed and are negative.   Social History  Substance Use Topics  . Smoking status: Former Smoker    Quit date: 04/22/2013  . Smokeless tobacco: Never Used  . Alcohol Use: No     Comment: remotely quit alcohol use. Hx of heavy alcohol use.   Objective:   BP 122/76 mmHg  Pulse 69  Temp(Src) 97.6 F (36.4 C) (Oral)  Resp 16  Wt 223 lb (101.152 kg)  SpO2 96% (at rest on room air)  Physical Exam  General Appearance:    Alert, cooperative, no distress  Eyes:    PERRL, conjunctiva/corneas clear, EOM's intact       Lungs:     Mild diffuse wheezing, respirations unlabored  Heart:    Regular rate and rhythm  Neurologic:   Awake, alert, oriented x 3. No apparent focal neurological           defect.       O2 on exertion = 91% on room air     Assessment & Plan:     1. COPD exacerbation Resolved, no back not baseline with oximetry remaining above 90% on exertion and at rest on room air. Follow up Dr. Humphrey Rolls in 07-23-15 as scheduled. Call if any worsening dyspnea.   2. Essential hypertension well controlled  3. Coronary artery disease due to lipid rich plaque No chest pain since discharge. Suspect chest pain may have been aggravated by COPD exacerbation.   4. Chronic obstructive pulmonary disease, unspecified COPD, unspecified chronic bronchitis type Stable at this time.   Follow up 3 months.        [name], MD  [facility]

## 2015-07-04 ENCOUNTER — Telehealth: Payer: Self-pay | Admitting: Family Medicine

## 2015-07-04 ENCOUNTER — Other Ambulatory Visit: Payer: Self-pay | Admitting: Family Medicine

## 2015-07-04 NOTE — Telephone Encounter (Signed)
Pt stated that he contacted [employer] to pick up the oxygen supplies since Dr. [name] told him he no longer needs oxygen. Pt stated he was told we need to send [employer] discharge info so they can pick up the supplies. Thanks TNP

## 2015-07-10 NOTE — Telephone Encounter (Signed)
Order faxed to [facility]. [phone].

## 2015-07-10 NOTE — Telephone Encounter (Signed)
Please fax written order. Thanks.

## 2015-07-11 ENCOUNTER — Encounter (HOSPITAL_COMMUNITY): Payer: Self-pay | Admitting: Family Medicine

## 2015-07-11 ENCOUNTER — Ambulatory Visit: Payer: Self-pay | Admitting: Family Medicine

## 2015-07-11 ENCOUNTER — Telehealth: Payer: Self-pay | Admitting: Family Medicine

## 2015-07-11 ENCOUNTER — Emergency Department (HOSPITAL_COMMUNITY): Payer: Medicare PPO

## 2015-07-11 ENCOUNTER — Observation Stay (HOSPITAL_COMMUNITY)
Admission: EM | Admit: 2015-07-11 | Discharge: 2015-07-13 | Disposition: A | Discharge status: Home / Self Care | Payer: Medicare PPO | Attending: Cardiovascular Disease | Admitting: Cardiovascular Disease

## 2015-07-11 DIAGNOSIS — Z791 Long term (current) use of non-steroidal anti-inflammatories (NSAID): Secondary | ICD-10-CM | POA: Insufficient documentation

## 2015-07-11 DIAGNOSIS — K900 Celiac disease: Secondary | ICD-10-CM | POA: Diagnosis not present

## 2015-07-11 DIAGNOSIS — E1121 Type 2 diabetes mellitus with diabetic nephropathy: Secondary | ICD-10-CM

## 2015-07-11 DIAGNOSIS — R079 Chest pain, unspecified: Secondary | ICD-10-CM | POA: Diagnosis present

## 2015-07-11 DIAGNOSIS — J45909 Unspecified asthma, uncomplicated: Secondary | ICD-10-CM | POA: Diagnosis not present

## 2015-07-11 DIAGNOSIS — J449 Chronic obstructive pulmonary disease, unspecified: Secondary | ICD-10-CM | POA: Diagnosis present

## 2015-07-11 DIAGNOSIS — Z7902 Long term (current) use of antithrombotics/antiplatelets: Secondary | ICD-10-CM | POA: Diagnosis not present

## 2015-07-11 DIAGNOSIS — I208 Other forms of angina pectoris: Secondary | ICD-10-CM

## 2015-07-11 DIAGNOSIS — T82857A Stenosis of cardiac prosthetic devices, implants and grafts, initial encounter: Secondary | ICD-10-CM | POA: Insufficient documentation

## 2015-07-11 DIAGNOSIS — J42 Unspecified chronic bronchitis: Secondary | ICD-10-CM | POA: Diagnosis not present

## 2015-07-11 DIAGNOSIS — E782 Mixed hyperlipidemia: Secondary | ICD-10-CM | POA: Diagnosis present

## 2015-07-11 DIAGNOSIS — I200 Unstable angina: Secondary | ICD-10-CM | POA: Diagnosis present

## 2015-07-11 DIAGNOSIS — Z79899 Other long term (current) drug therapy: Secondary | ICD-10-CM | POA: Diagnosis not present

## 2015-07-11 DIAGNOSIS — I25810 Atherosclerosis of coronary artery bypass graft(s) without angina pectoris: Secondary | ICD-10-CM | POA: Insufficient documentation

## 2015-07-11 DIAGNOSIS — Z7982 Long term (current) use of aspirin: Secondary | ICD-10-CM | POA: Insufficient documentation

## 2015-07-11 DIAGNOSIS — R0789 Other chest pain: Principal | ICD-10-CM | POA: Insufficient documentation

## 2015-07-11 DIAGNOSIS — Z8249 Family history of ischemic heart disease and other diseases of the circulatory system: Secondary | ICD-10-CM | POA: Diagnosis not present

## 2015-07-11 DIAGNOSIS — E785 Hyperlipidemia, unspecified: Secondary | ICD-10-CM | POA: Diagnosis present

## 2015-07-11 DIAGNOSIS — Z87891 Personal history of nicotine dependence: Secondary | ICD-10-CM | POA: Insufficient documentation

## 2015-07-11 DIAGNOSIS — I509 Heart failure, unspecified: Secondary | ICD-10-CM | POA: Insufficient documentation

## 2015-07-11 DIAGNOSIS — R0602 Shortness of breath: Secondary | ICD-10-CM | POA: Diagnosis not present

## 2015-07-11 DIAGNOSIS — Y831 Surgical operation with implant of artificial internal device as the cause of abnormal reaction of the patient, or of later complication, without mention of misadventure at the time of the procedure: Secondary | ICD-10-CM | POA: Insufficient documentation

## 2015-07-11 DIAGNOSIS — I10 Essential (primary) hypertension: Secondary | ICD-10-CM | POA: Diagnosis not present

## 2015-07-11 DIAGNOSIS — E780 Pure hypercholesterolemia: Secondary | ICD-10-CM | POA: Diagnosis not present

## 2015-07-11 DIAGNOSIS — I25720 Atherosclerosis of autologous artery coronary artery bypass graft(s) with unstable angina pectoris: Secondary | ICD-10-CM | POA: Diagnosis not present

## 2015-07-11 DIAGNOSIS — K219 Gastro-esophageal reflux disease without esophagitis: Secondary | ICD-10-CM | POA: Diagnosis not present

## 2015-07-11 DIAGNOSIS — E119 Type 2 diabetes mellitus without complications: Secondary | ICD-10-CM | POA: Diagnosis not present

## 2015-07-11 DIAGNOSIS — I209 Angina pectoris, unspecified: Secondary | ICD-10-CM | POA: Insufficient documentation

## 2015-07-11 DIAGNOSIS — J439 Emphysema, unspecified: Secondary | ICD-10-CM | POA: Insufficient documentation

## 2015-07-11 DIAGNOSIS — Z79891 Long term (current) use of opiate analgesic: Secondary | ICD-10-CM | POA: Diagnosis not present

## 2015-07-11 LAB — BASIC METABOLIC PANEL
Anion gap: 9 (ref 5–15)
BUN: 8 mg/dL (ref 6–20)
CO2: 27 mmol/L (ref 22–32)
Calcium: 8.9 mg/dL (ref 8.9–10.3)
Chloride: 103 mmol/L (ref 101–111)
Creatinine, Ser: 0.99 mg/dL (ref 0.61–1.24)
GFR calc Af Amer: >60 mL/min (ref >60)
GFR calc non Af Amer: >60 mL/min (ref >60)
Glucose, Bld: 147 mg/dL — ABNORMAL HIGH (ref 65–99)
Potassium: 4.1 mmol/L (ref 3.5–5.1)
Sodium: 139 mmol/L (ref 135–145)

## 2015-07-11 LAB — CBC
HCT: 45.6 % (ref 39.0–52.0)
Hemoglobin: 14.8 g/dL (ref 13.0–17.0)
MCH: 29 pg (ref 26.0–34.0)
MCHC: 32.5 g/dL (ref 30.0–36.0)
MCV: 89.2 fL (ref 78.0–100.0)
Platelets: 133 10*3/uL — ABNORMAL LOW (ref 150–400)
RBC: 5.11 MIL/uL (ref 4.22–5.81)
RDW: 14.9 % (ref 11.5–15.5)
WBC: 6.8 10*3/uL (ref 4.0–10.5)

## 2015-07-11 LAB — I-STAT TROPONIN, ED: Troponin i, poc: 0 ng/mL (ref 0.00–0.08)

## 2015-07-11 LAB — MRSA PCR SCREENING: MRSA by PCR: NEGATIVE

## 2015-07-11 LAB — PROTIME-INR
INR: 0.99 (ref 0.00–1.49)
Prothrombin Time: 13.3 seconds (ref 11.6–15.2)

## 2015-07-11 LAB — PLATELET INHIBITION P2Y12: Platelet Function  P2Y12: 50 [PRU] — ABNORMAL LOW (ref 194–418)

## 2015-07-11 LAB — TROPONIN I: Troponin I: <0.03 ng/mL (ref <0.031)

## 2015-07-11 LAB — GLUCOSE, CAPILLARY: Glucose-Capillary: 96 mg/dL (ref 65–99)

## 2015-07-11 MED ORDER — SODIUM CHLORIDE 0.9 % IV SOLN: 250.0000 mL | INTRAVENOUS | Status: DC | PRN

## 2015-07-11 MED ORDER — LISINOPRIL 20 MG PO TABS
20.0000 mg | ORAL_TABLET | Freq: Every day | ORAL | Status: DC
  Administered 2015-07-12 – 2015-07-13 (×2): 20 mg via ORAL
  Filled 2015-07-11 (×2): qty 1

## 2015-07-11 MED ORDER — ALBUTEROL SULFATE HFA 108 (90 BASE) MCG/ACT IN AERS: 2.0000 | INHALATION_SPRAY | Freq: Four times a day (QID) | RESPIRATORY_TRACT | Status: DC | PRN

## 2015-07-11 MED ORDER — MOMETASONE FURO-FORMOTEROL FUM 200-5 MCG/ACT IN AERO
2.0000 | INHALATION_SPRAY | Freq: Two times a day (BID) | RESPIRATORY_TRACT | Status: DC
  Administered 2015-07-12 – 2015-07-13 (×3): 2 via RESPIRATORY_TRACT
  Filled 2015-07-11 (×2): qty 8.8

## 2015-07-11 MED ORDER — ASPIRIN EC 81 MG PO TBEC
81.0000 mg | DELAYED_RELEASE_TABLET | Freq: Every morning | ORAL | Status: DC
  Administered 2015-07-12 – 2015-07-13 (×2): 81 mg via ORAL
  Filled 2015-07-11 (×2): qty 1

## 2015-07-11 MED ORDER — ZOLPIDEM TARTRATE 5 MG PO TABS
5.0000 mg | ORAL_TABLET | Freq: Every evening | ORAL | Status: DC | PRN
  Administered 2015-07-12: 5 mg via ORAL
  Filled 2015-07-11: qty 1

## 2015-07-11 MED ORDER — ISOSORBIDE MONONITRATE ER 60 MG PO TB24
60.0000 mg | ORAL_TABLET | Freq: Every day | ORAL | Status: DC
  Administered 2015-07-12 – 2015-07-13 (×2): 60 mg via ORAL
  Filled 2015-07-11 (×2): qty 1

## 2015-07-11 MED ORDER — SODIUM CHLORIDE 0.9 % IJ SOLN: 3.0000 mL | INTRAMUSCULAR | Status: DC | PRN

## 2015-07-11 MED ORDER — POTASSIUM CHLORIDE CRYS ER 10 MEQ PO TBCR
10.0000 meq | EXTENDED_RELEASE_TABLET | Freq: Every morning | ORAL | Status: DC
  Administered 2015-07-12 – 2015-07-13 (×2): 10 meq via ORAL
  Filled 2015-07-11 (×2): qty 1

## 2015-07-11 MED ORDER — NITROGLYCERIN 0.4 MG SL SUBL
0.4000 mg | SUBLINGUAL_TABLET | SUBLINGUAL | Status: DC | PRN
  Filled 2015-07-11: qty 1

## 2015-07-11 MED ORDER — OMEGA-3-ACID ETHYL ESTERS 1 G PO CAPS
4.0000 g | ORAL_CAPSULE | Freq: Every day | ORAL | Status: DC
  Administered 2015-07-12 – 2015-07-13 (×2): 4 g via ORAL
  Filled 2015-07-11 (×2): qty 4

## 2015-07-11 MED ORDER — SODIUM CHLORIDE 0.9 % WEIGHT BASED INFUSION: 1.0000 mL/kg/h | INTRAVENOUS | Status: DC

## 2015-07-11 MED ORDER — MORPHINE SULFATE (PF) 4 MG/ML IV SOLN
4.0000 mg | Freq: Once | INTRAVENOUS | Status: AC
  Administered 2015-07-11: 4 mg via INTRAVENOUS
  Filled 2015-07-11: qty 1

## 2015-07-11 MED ORDER — PANTOPRAZOLE SODIUM 40 MG PO TBEC
40.0000 mg | DELAYED_RELEASE_TABLET | Freq: Every day | ORAL | Status: DC
  Administered 2015-07-12 – 2015-07-13 (×2): 40 mg via ORAL
  Filled 2015-07-11 (×2): qty 1

## 2015-07-11 MED ORDER — OXYCODONE-ACETAMINOPHEN 5-325 MG PO TABS
1.0000 | ORAL_TABLET | ORAL | Status: DC | PRN
  Administered 2015-07-12: 1 via ORAL
  Filled 2015-07-11: qty 1

## 2015-07-11 MED ORDER — ONDANSETRON HCL 4 MG/2ML IJ SOLN
4.0000 mg | Freq: Once | INTRAMUSCULAR | Status: AC
  Administered 2015-07-11: 4 mg via INTRAVENOUS
  Filled 2015-07-11: qty 2

## 2015-07-11 MED ORDER — HYOSCYAMINE SULFATE 0.125 MG SL SUBL
0.1250 mg | SUBLINGUAL_TABLET | Freq: Four times a day (QID) | SUBLINGUAL | Status: DC | PRN
  Filled 2015-07-11: qty 1

## 2015-07-11 MED ORDER — ONDANSETRON HCL 4 MG/2ML IJ SOLN
4.0000 mg | Freq: Four times a day (QID) | INTRAMUSCULAR | Status: DC | PRN
  Administered 2015-07-11 – 2015-07-12 (×3): 4 mg via INTRAVENOUS
  Filled 2015-07-11 (×3): qty 2

## 2015-07-11 MED ORDER — HYDROMORPHONE HCL 1 MG/ML IJ SOLN
0.5000 mg | INTRAMUSCULAR | Status: DC | PRN
  Administered 2015-07-11: 0.5 mg via INTRAVENOUS
  Filled 2015-07-11: qty 1

## 2015-07-11 MED ORDER — NITROGLYCERIN 0.4 MG SL SUBL: 0.4000 mg | SUBLINGUAL_TABLET | SUBLINGUAL | Status: DC | PRN

## 2015-07-11 MED ORDER — INSULIN ASPART 100 UNIT/ML ~~LOC~~ SOLN: 0.0000 [IU] | Freq: Every day | SUBCUTANEOUS | Status: DC

## 2015-07-11 MED ORDER — SODIUM CHLORIDE 0.9 % WEIGHT BASED INFUSION
3.0000 mL/kg/h | INTRAVENOUS | Status: DC
  Administered 2015-07-12: 3 mL/kg/h via INTRAVENOUS

## 2015-07-11 MED ORDER — ASPIRIN 81 MG PO CHEW
81.0000 mg | CHEWABLE_TABLET | ORAL | Status: AC
  Administered 2015-07-12: 81 mg via ORAL
  Filled 2015-07-11: qty 1

## 2015-07-11 MED ORDER — ATORVASTATIN CALCIUM 80 MG PO TABS
80.0000 mg | ORAL_TABLET | Freq: Every day | ORAL | Status: DC
  Administered 2015-07-11 – 2015-07-12 (×2): 80 mg via ORAL
  Filled 2015-07-11 (×3): qty 1

## 2015-07-11 MED ORDER — ASPIRIN 81 MG PO CHEW
324.0000 mg | CHEWABLE_TABLET | Freq: Once | ORAL | Status: AC
  Administered 2015-07-11: 324 mg via ORAL
  Filled 2015-07-11: qty 4

## 2015-07-11 MED ORDER — ENOXAPARIN SODIUM 40 MG/0.4ML ~~LOC~~ SOLN
40.0000 mg | SUBCUTANEOUS | Status: DC
  Administered 2015-07-11: 40 mg via SUBCUTANEOUS
  Filled 2015-07-11: qty 0.4

## 2015-07-11 MED ORDER — OXYCODONE-ACETAMINOPHEN 10-325 MG PO TABS: 1.0000 | ORAL_TABLET | ORAL | Status: DC | PRN

## 2015-07-11 MED ORDER — FUROSEMIDE 40 MG PO TABS
40.0000 mg | ORAL_TABLET | Freq: Every morning | ORAL | Status: DC
  Administered 2015-07-12 – 2015-07-13 (×2): 40 mg via ORAL
  Filled 2015-07-11 (×2): qty 1

## 2015-07-11 MED ORDER — ASPIRIN 81 MG PO CHEW
324.0000 mg | CHEWABLE_TABLET | Freq: Once | ORAL | Status: DC
  Filled 2015-07-11: qty 4

## 2015-07-11 MED ORDER — ACETAMINOPHEN 325 MG PO TABS: 650.0000 mg | ORAL_TABLET | ORAL | Status: DC | PRN

## 2015-07-11 MED ORDER — CLOPIDOGREL BISULFATE 75 MG PO TABS
75.0000 mg | ORAL_TABLET | Freq: Every day | ORAL | Status: DC
  Administered 2015-07-12 – 2015-07-13 (×2): 75 mg via ORAL
  Filled 2015-07-11 (×3): qty 1

## 2015-07-11 MED ORDER — LORATADINE 10 MG PO TABS
10.0000 mg | ORAL_TABLET | Freq: Every day | ORAL | Status: DC
  Administered 2015-07-11 – 2015-07-13 (×3): 10 mg via ORAL
  Filled 2015-07-11 (×4): qty 1

## 2015-07-11 MED ORDER — ALBUTEROL SULFATE (2.5 MG/3ML) 0.083% IN NEBU: 2.5000 mg | INHALATION_SOLUTION | Freq: Four times a day (QID) | RESPIRATORY_TRACT | Status: DC | PRN

## 2015-07-11 MED ORDER — SODIUM CHLORIDE 0.9 % IJ SOLN
3.0000 mL | Freq: Two times a day (BID) | INTRAMUSCULAR | Status: DC
  Administered 2015-07-11 – 2015-07-12 (×2): 3 mL via INTRAVENOUS

## 2015-07-11 MED ORDER — OXYCODONE HCL 5 MG PO TABS
5.0000 mg | ORAL_TABLET | ORAL | Status: DC | PRN
  Administered 2015-07-11 – 2015-07-12 (×3): 5 mg via ORAL
  Filled 2015-07-11 (×3): qty 1

## 2015-07-11 MED ORDER — INSULIN ASPART 100 UNIT/ML ~~LOC~~ SOLN
0.0000 [IU] | Freq: Three times a day (TID) | SUBCUTANEOUS | Status: DC
  Administered 2015-07-13: 2 [IU] via SUBCUTANEOUS

## 2015-07-11 MED ORDER — ALPRAZOLAM 0.25 MG PO TABS
0.5000 mg | ORAL_TABLET | Freq: Three times a day (TID) | ORAL | Status: DC | PRN
  Administered 2015-07-11: 1 mg via ORAL
  Administered 2015-07-12 – 2015-07-13 (×3): 0.5 mg via ORAL
  Filled 2015-07-11: qty 4
  Filled 2015-07-11 (×3): qty 2

## 2015-07-11 MED ORDER — TIOTROPIUM BROMIDE MONOHYDRATE 18 MCG IN CAPS
18.0000 ug | ORAL_CAPSULE | Freq: Every day | RESPIRATORY_TRACT | Status: DC
  Administered 2015-07-12 – 2015-07-13 (×2): 18 ug via RESPIRATORY_TRACT
  Filled 2015-07-11: qty 5

## 2015-07-11 MED ORDER — ALPRAZOLAM 0.25 MG PO TABS: 0.2500 mg | ORAL_TABLET | Freq: Two times a day (BID) | ORAL | Status: DC | PRN

## 2015-07-11 MED ORDER — SODIUM CHLORIDE 0.9 % IJ SOLN
3.0000 mL | Freq: Two times a day (BID) | INTRAMUSCULAR | Status: DC
  Administered 2015-07-12: 3 mL via INTRAVENOUS

## 2015-07-11 MED ORDER — TAMSULOSIN HCL 0.4 MG PO CAPS
0.4000 mg | ORAL_CAPSULE | Freq: Every day | ORAL | Status: DC
  Administered 2015-07-12 – 2015-07-13 (×2): 0.4 mg via ORAL
  Filled 2015-07-11 (×2): qty 1

## 2015-07-11 NOTE — Telephone Encounter (Signed)
Pt states Dr [name] needs to fax a letter to [place] stating pt no longer needs oxygen.  Pt states when this letter is sent Huey Romans will come pick up the oxygen equipment.  QK#[phone]/FB

## 2015-07-11 NOTE — ED Notes (Signed)
Barrett, PA cardiology at bedside

## 2015-07-11 NOTE — ED Notes (Signed)
Pt here for chest pressure and squeezing that started yesterday. sts worse today and worse with walking. sts SOB.

## 2015-07-11 NOTE — ED Notes (Signed)
Spoke with pharmacy re: sending daily medications to [facility]

## 2015-07-11 NOTE — ED Provider Notes (Signed)
CSN: [id number]     Arrival date & time 07/11/15  1331 History   First MD Initiated Contact with Patient 07/11/15 1357     Chief Complaint  Patient presents with  . Chest Pain  . Shortness of Breath     (Consider location/radiation/quality/duration/timing/severity/associated sxs/prior Treatment) Patient is a 59 y.o. male presenting with chest pain and shortness of breath.  Chest Pain Pain quality: tightness   Pain quality comment:  Squeezing Associated symptoms: cough (no change from baseline COPD), nausea and shortness of breath (with exertion, otherwise no change frmo baseline COPD\)   Associated symptoms: no abdominal pain, no back pain, no fever, no headache, no syncope and not vomiting   Risk factors: coronary artery disease (stent [place] [facility] 2014, stent [facility] 2015, CABG [facility], Cardiologist at [facility] [place] ago angioplasty reports would need a stent but was close to graft, interesterd in seeing cardiology here), diabetes mellitus, high cholesterol and hypertension   Risk factors: no prior DVT/PE and no surgery   Shortness of Breath Associated symptoms: chest pain and cough (no change from baseline COPD)   Associated symptoms: no abdominal pain, no fever, no headaches, no rash, no sore throat, no syncope, no vomiting and no wheezing     Past Medical History  Diagnosis Date  . Hypertension   . Coronary artery disease   . COPD (chronic obstructive pulmonary disease)   . Chronic bronchitis   . Emphysema lung   . Diabetes mellitus without complication   . Asthma   . Celiac disease   . CHF (congestive heart failure)    Past Surgical History  Procedure Laterality Date  . Vascular surgery    . Bypass graft    . Coronary angioplasty with stent placement  2014    DES CFX  . Tonsillectomy    . Cholecystectomy    . Esophageal dilation    . Coronary angioplasty  03/2015    POBA LAD at [facility]  . Cardiac catheterization  05/2015    at [facility] [place], LAD diffuse dz, D1  40%, D2 [place], D3 40%, CFX [place], OM1 OK, OM2 OK, RCA OK, LIMA-LAD atretic, SVG-OM 50%   Family History  Problem Relation Age of Onset  . Heart attack Mother   . Cirrhosis Mother   . Diabetes Mother   . Depression Mother   . Heart disease Mother   . Heart attack Father   . Diabetes Father   . Depression Father   . Heart disease Father   . Parkinson's disease Brother    Social History  Substance Use Topics  . Smoking status: Former Smoker    Quit date: 04/22/2013  . Smokeless tobacco: Never Used  . Alcohol Use: No     Comment: remotely quit alcohol use. Hx of heavy alcohol use.    Review of Systems  Constitutional: Negative for fever and appetite change.  HENT: Negative for sore throat.   Eyes: Negative for visual disturbance.  Respiratory: Positive for cough (no change from baseline COPD) and shortness of breath (with exertion, otherwise no change frmo baseline COPD\). Negative for wheezing.   Cardiovascular: Positive for chest pain and leg swelling. Negative for syncope.  Gastrointestinal: Positive for nausea. Negative for vomiting, abdominal pain, diarrhea and constipation.  Genitourinary: Negative for difficulty urinating.  Musculoskeletal: Negative for back pain and neck stiffness.  Skin: Negative for rash.  Neurological: Negative for syncope and headaches.      Allergies  Albuterol sulfate; Demerol; Sulfa antibiotics; and  Morphine sulfate  Home Medications   Prior to Admission medications   Medication Sig Start Date End Date Taking? Authorizing Provider  ADVAIR DISKUS 500-50 MCG/DOSE AEPB ONE PUFF EVERY 12 HOURS AS DIRECTED. RINSE MOUTH AFTER USING 07/04/15  Yes Birdie Sons, MD  albuterol (PROVENTIL HFA;VENTOLIN HFA) 108 (90 BASE) MCG/ACT inhaler Inhale 2 puffs into the lungs every 6 (six) hours as needed for wheezing. 07/03/15  Yes Birdie Sons, MD  ALPRAZolam Duanne Moron) 1 MG tablet TAKE 1/2-1 TABLET BY MOUTH 3 TIMES A DAY 05/22/15  Yes Birdie Sons, MD  aspirin EC  81 MG tablet Take 81 mg by mouth every morning.   Yes Historical Provider, MD  atorvastatin (LIPITOR) 80 MG tablet Take 1 tablet (80 mg total) by mouth at bedtime. 04/25/15  Yes Birdie Sons, MD  cetirizine (ZYRTEC) 10 MG tablet Take 10 mg by mouth at bedtime.    Yes Historical Provider, MD  clopidogrel (PLAVIX) 75 MG tablet TAKE ONE (1) TABLET EACH DAY 04/22/15  Yes Birdie Sons, MD  furosemide (LASIX) 40 MG tablet Take 40 mg by mouth every morning.   Yes Historical Provider, MD  hyoscyamine (LEVSIN SL) 0.125 MG SL tablet Place 0.125 mg under the tongue every 6 (six) hours as needed for cramping.    Yes Historical Provider, MD  isosorbide mononitrate (IMDUR) 60 MG 24 hr tablet Take 60 mg by mouth daily.  06/12/15  Yes Historical Provider, MD  lansoprazole (PREVACID) 30 MG capsule Take 30 mg by mouth every morning.  06/17/15  Yes Historical Provider, MD  lisinopril (PRINIVIL,ZESTRIL) 20 MG tablet Take 20 mg by mouth daily.   Yes Historical Provider, MD  metFORMIN (GLUCOPHAGE) 500 MG tablet Take 1 tablet (500 mg total) by mouth every morning. 04/25/15  Yes Birdie Sons, MD  nitroGLYCERIN (NITROSTAT) 0.4 MG SL tablet Place 1 tablet (0.4 mg total) under the tongue every 5 (five) minutes as needed for chest pain. Every 5 minutes as needed for chest pain 04/26/15  Yes Birdie Sons, MD  omega-3 acid ethyl esters (LOVAZA) 1 G capsule TAKE FOUR CAPSULES BY MOUTH DAILY 07/01/15  Yes Birdie Sons, MD  oxyCODONE-acetaminophen (PERCOCET) 10-325 MG per tablet Take 1 tablet by mouth every 4 (four) hours as needed for pain. 07/03/15  Yes Birdie Sons, MD  potassium chloride (K-DUR,KLOR-CON) 10 MEQ tablet Take 10 mEq by mouth every morning.   Yes Historical Provider, MD  tamsulosin (FLOMAX) 0.4 MG CAPS capsule Take 0.4 mg by mouth daily.   Yes Historical Provider, MD  tiotropium (SPIRIVA) 18 MCG inhalation capsule Place 18 mcg into inhaler and inhale daily.   Yes Historical Provider, MD  magnesium citrate SOLN  Take 296 mLs (1 Bottle total) by mouth once. Patient not taking: Reported on 07/01/2015 05/08/15   Joanne Gavel, MD  meloxicam (MOBIC) 7.5 MG tablet Take 1 tablet (7.5 mg total) by mouth daily. Patient not taking: Reported on 07/01/2015 06/03/15 06/02/16  Marylene Land, NP  polyethylene glycol [facility] / GLYCOLAX) packet Take 17 g by mouth daily. Patient not taking: Reported on 07/01/2015 05/05/15   Earleen Newport, MD  predniSONE (DELTASONE) 20 MG tablet 3 tablets daily x 4 days Patient not taking: Reported on 07/11/2015 07/01/15   Paulette Blanch, MD   BP 149/92 mmHg  Pulse 78  Temp(Src) 97.6 F (36.4 C) (Oral)  Resp 14  SpO2 95% Physical Exam  Constitutional: He is oriented to person, place, and time. He  appears well-developed and well-nourished. No distress.  HENT:  Head: Normocephalic and atraumatic.  Mouth/Throat: No oropharyngeal exudate.  Eyes: Conjunctivae and EOM are normal.  Neck: Normal range of motion.  Cardiovascular: Normal rate, regular rhythm, normal heart sounds and intact distal pulses.  Exam reveals no gallop and no friction rub.   No murmur heard. Pulmonary/Chest: Effort normal and breath sounds normal. No respiratory distress. He has no wheezes. He has no rales.  Abdominal: Soft. He exhibits no distension. There is no tenderness. There is no guarding.  Musculoskeletal: He exhibits edema (2+ bilateral).  Neurological: He is alert and oriented to person, place, and time.  Skin: Skin is warm and dry. He is not diaphoretic.  Nursing note and vitals reviewed.   ED Course  Procedures (including critical care time) Labs Review Labs Reviewed  BASIC METABOLIC PANEL - Abnormal; Notable for the following:    Glucose, Bld 147 (*)    All other components within normal limits  CBC - Abnormal; Notable for the following:    Platelets 133 (*)    All other components within normal limits  PROTIME-INR  I-STAT TROPOININ, ED    Imaging Review Dg Chest 2 View  07/11/2015    CLINICAL DATA:  Chest pain shortness of breath when walking. Previously placed coronary artery stents.  EXAM: CHEST  2 VIEW  COMPARISON:  06/30/2015.  FINDINGS: Normal sized heart. Stable mildly prominent pulmonary vasculature and interstitial markings. Coronary artery stents are again noted as well as post CABG changes. Diffuse osteopenia. Thoracic spine degenerative changes.  IMPRESSION: No acute abnormality. Stable mild pulmonary vascular congestion and mild chronic interstitial lung disease.   Electronically Signed   By: Claudie Revering M.D.   On: 07/11/2015 14:22   I have personally reviewed and evaluated these images and lab results as part of my medical decision-making.   EKG Interpretation   Date/Time:  Thursday July 11 2015 13:34:35 EDT Ventricular Rate:  98 PR Interval:  134 QRS Duration: 86 QT Interval:  314 QTC Calculation: 400 R Axis:   53 Text Interpretation:  Sinus rhythm with Premature atrial complexes  Otherwise normal ECG No significant change since last tracing Confirmed by  [facility] MD, Jaidan Prevette ([id number]) on 07/11/2015 1:56:47 PM      MDM   Final diagnoses:  None    59 year old male with history of CAD s/p CABG and stents, hypertension, DM,  presents with concern of chest pain. Differential diagnosis for chest pain includes pulmonary embolus, dissection, pneumothorax, pneumonia, ACS, myocarditis, pericarditis.  EKG was done and evaluate by me and showed no acute ST changes and no signs of pericarditis. Chest x-ray was done and evaluated by me and radiology and showed no sign of pneumonia or pneumothorax.  Patient is low risk [place] and have low suspicion for PE.  Given history and physical exam, suspect patient's symptoms represent angina. Patient had a cath done July 26 at [facility] where his cardiologist is located, which showed any artery disease however it was not amenable to stenting due to location. Pt reports he is interested in being seen by cardiology here at [facility].  Given patient's worsening exertional chest tightness as well as chest pain at rest cardiology was consulted.  Patient was given aspirin, nitroglycerin, and morphine for chest pain relief with improvement.  Pt to be admitted to cardiology for continued care.    Gareth Morgan, MD 07/11/15 [phone]

## 2015-07-11 NOTE — ED Notes (Signed)
Pt verbalizes that he has all of his belongings before leaving ED

## 2015-07-11 NOTE — ED Notes (Signed)
Attempted report 

## 2015-07-11 NOTE — H&P (Signed)
HIstory and Physical   Patient ID: Lawrence [name] MRN: [id number], DOB/AGE: 1955/11/20 58 y.o. Date of Encounter: 07/11/2015  Primary Physician: [name], MD Primary Cardiologist: Dr Gennette Pac in [place] (last o.v. 04/2015)  Chief Complaint:  Chest pain  HPI: Lawrence [name] is a 59 y.o. male with a history of CABG 2002 x2 at [facility], [place] CFX 2014, POBA LAD 03/2015, HTN, HL, DM, GERD.    POBA LAD 03/2015, LIMA-LAD atretic   06/11/2015 [facility] heart cath: Non-obs disease  Admitted [facility] 08/10-08/11 for chest pain. He was treated for COPD exacerbation and improved.   ER visit 08/14 for SOB/Chest pain, rx w/ DuoNeb. Also given morphine injection for chronic back pain.  08/17, seen in primary care office and was doing well. Was taken off home Oxygen. Not having chest pain at that time. He was told he did not need home O2.   Since then, he has not used his oxygen.   Pt developed chest pain about 08/19. Continuous at 7/10, increased to 9/10 with ambulation or exertion. He tried SL NTG with minimal relief. He takes Percocet chronically, it did not change his pain. His DOE has been at baseline. Wheezes daily, helped by rx, but still hears it. GI symptoms worse than usual, rx with Levsin, which helps.   Today, pain was worse than usual and caused SOB. Pt became worried and came to [facility] for a second opinion.   The pain is across his left chest and is a squeezing sensation. Much worse with deep inspiration and that makes it worse with activity. In ER, pain went from 9/10 to a 7/10 with morphine. Also got ASA 81 x 4 and Zofran.    Past Medical History  Diagnosis Date  . Hypertension   . Coronary artery disease   . COPD (chronic obstructive pulmonary disease)   . Chronic bronchitis   . Emphysema lung   . Diabetes mellitus without complication   . Asthma   . Celiac disease   . CHF (congestive heart failure)     Surgical History:  Past Surgical History  Procedure Laterality  Date  . Vascular surgery    . Bypass graft    . Coronary angioplasty with stent placement  2014    DES CFX  . Tonsillectomy    . Cholecystectomy    . Esophageal dilation    . Coronary angioplasty  03/2015    POBA LAD at [facility]  . Cardiac catheterization  05/2015    at [facility] [place], LAD diffuse dz, D1 40%, D2 OK, D3 40%, CFX OK, OM1 OK, OM2 OK, RCA OK, LIMA-LAD atretic, SVG-OM 50%    I have reviewed the patient's current medications. Medication Sig  ADVAIR DISKUS 500-50 MCG/DOSE AEPB ONE PUFF EVERY 12 HOURS AS DIRECTED. RINSE MOUTH AFTER USING  albuterol (PROVENTIL HFA;VENTOLIN HFA) 108 (90 BASE) MCG/ACT inhaler Inhale 2 puffs into the lungs every 6 (six) hours as needed for wheezing.  ALPRAZolam (XANAX) 1 MG tablet TAKE 1/2-1 TABLET BY MOUTH 3 TIMES A DAY  aspirin EC 81 MG tablet Take 81 mg by mouth every morning.  atorvastatin (LIPITOR) 80 MG tablet Take 1 tablet (80 mg total) by mouth at bedtime.  cetirizine (ZYRTEC) 10 MG tablet Take 10 mg by mouth at bedtime.   clopidogrel (PLAVIX) 75 MG tablet TAKE ONE (1) TABLET EACH DAY  furosemide (LASIX) 40 MG tablet Take 40 mg by mouth every morning.  hyoscyamine (LEVSIN SL) 0.125 MG SL tablet  Place 0.125 mg under the tongue every 6 (six) hours as needed for cramping.   isosorbide mononitrate (IMDUR) 60 MG 24 hr tablet Take 60 mg by mouth daily.   lansoprazole (PREVACID) 30 MG capsule Take 30 mg by mouth every morning.   lisinopril (PRINIVIL,ZESTRIL) 20 MG tablet Take 20 mg by mouth daily.  metFORMIN (GLUCOPHAGE) 500 MG tablet Take 1 tablet (500 mg total) by mouth every morning.  nitroGLYCERIN (NITROSTAT) 0.4 MG SL tablet Place 1 tablet (0.4 mg total) under the tongue every 5 (five) minutes as needed for chest pain. Every 5 minutes as needed for chest pain  omega-3 acid ethyl esters (LOVAZA) 1 G capsule TAKE FOUR CAPSULES BY MOUTH DAILY  oxyCODONE-acetaminophen (PERCOCET) 10-325 MG per tablet Take 1 tablet by mouth every 4 (four) hours as  needed for pain.  potassium chloride (K-DUR,KLOR-CON) 10 MEQ tablet Take 10 mEq by mouth every morning.  tamsulosin (FLOMAX) 0.4 MG CAPS capsule Take 0.4 mg by mouth daily.  tiotropium (SPIRIVA) 18 MCG inhalation capsule Place 18 mcg into inhaler and inhale daily.  magnesium citrate SOLN Take 296 mLs (1 Bottle total) by mouth once. Patient not taking: Reported on 07/01/2015  meloxicam (MOBIC) 7.5 MG tablet Take 1 tablet (7.5 mg total) by mouth daily. Patient not taking: Reported on 07/01/2015  polyethylene glycol (MIRALAX / GLYCOLAX) packet Take 17 g by mouth daily. Patient not taking: Reported on 07/01/2015  predniSONE (DELTASONE) 20 MG tablet 3 tablets daily x 4 days Patient not taking: Reported on 07/11/2015   Scheduled Meds:  Continuous Infusions:  PRN Meds:.nitroGLYCERIN  Allergies:  Allergies  Allergen Reactions  . Albuterol Sulfate [Albuterol]     *ANTIASTHMATIC AND BRONCHODILATOR AGENTS*  . Demerol [Meperidine] Hives  . Sulfa Antibiotics Hives  . Morphine Sulfate Rash    Nausea and vomiting    Social History   Social History  . Marital Status: Married    Spouse Name: N/A  . Number of Children: N/A  . Years of Education: N/A   Occupational History  . Disabled    Social History Main Topics  . Smoking status: Former Smoker    Quit date: 04/22/2013  . Smokeless tobacco: Never Used  . Alcohol Use: No     Comment: remotely quit alcohol use. Hx of heavy alcohol use.  . Drug Use: No  . Sexual Activity: Not on file   Other Topics Concern  . Not on file   Social History Narrative   Pt lives in [place] with wife.    Family History  Problem Relation Age of Onset  . Heart attack Mother   . Cirrhosis Mother   . Diabetes Mother   . Depression Mother   . Heart disease Mother   . Heart attack Father   . Diabetes Father   . Depression Father   . Heart disease Father   . Parkinson's disease Brother    Family Status  Relation Status Death Age  . Mother Deceased  36  . Father Deceased 47   . Brother Deceased     Review of Systems:   Full 14-point review of systems otherwise negative except as noted above.  Physical Exam: Blood pressure 149/92, pulse 78, temperature 97.6 F (36.4 C), temperature source Oral, resp. rate 14, SpO2 95 %. General: Well developed, well nourished,male in no acute distress. Head: Normocephalic, atraumatic, sclera non-icteric, no xanthomas, nares are without discharge. Dentition: poor Neck: No carotid bruits. JVD elevated at 9-10 cm. No thyromegally Lungs: Good expansion bilaterally.  without rhonchi. Slight exp wheeze Heart: Regular rate and rhythm with S1 S2.  No S3 or S4.  No murmur, no rubs, or gallops appreciated. Abdomen: Soft, non-tender, non-distended with normoactive bowel sounds. No hepatomegaly. No rebound/guarding. No obvious abdominal masses. Msk:  Strength and tone appear normal for age. No joint deformities or effusions, no spine or costo-vertebral angle tenderness. Extremities: No clubbing or cyanosis. No edema.  Distal pedal pulses are 2+ in 4 extrem.  Neuro: Alert and oriented X 3. Moves all extremities spontaneously. No focal deficits noted. Psych:  Responds to questions appropriately with a normal affect. Skin: No rashes or lesions noted  Labs:   Lab Results  Component Value Date   WBC 6.8 07/11/2015   HGB 14.8 07/11/2015   HCT 45.6 07/11/2015   MCV 89.2 07/11/2015   PLT 133* 07/11/2015    Recent Labs  07/11/15 1346  INR 0.99     Recent Labs Lab 07/11/15 1346  NA 139  K 4.1  CL 103  CO2 27  BUN 8  CREATININE 0.99  CALCIUM 8.9  GLUCOSE 147*    Recent Labs  07/11/15 1357  TROPIPOC 0.00   Lab Results  Component Value Date   CHOL 92 05/23/2014   HDL 28* 05/23/2014   LDLCALC 42 05/23/2014   TRIG 109 05/23/2014   B NATRIURETIC PEPTIDE  Date/Time Value Ref Range Status  06/25/2015 10:15 PM 68.0 0.0 - 100.0 pg/mL Final   Radiology/Studies: Dg Chest 2 View 07/11/2015    CLINICAL DATA:  Chest pain shortness of breath when walking. Previously placed coronary artery stents.  EXAM: CHEST  2 VIEW  COMPARISON:  06/30/2015.  FINDINGS: Normal sized heart. Stable mildly prominent pulmonary vasculature and interstitial markings. Coronary artery stents are again noted as well as post CABG changes. Diffuse osteopenia. Thoracic spine degenerative changes.  IMPRESSION: No acute abnormality. Stable mild pulmonary vascular congestion and mild chronic interstitial lung disease.   Electronically Signed   By: Claudie Revering M.D.   On: 07/11/2015 14:22     Cardiac Cath: 06/11/2015 Findings: 1. Non flow limiting coronary artery disease (as described below)  including a diffusely diseased LAD 2. Prior coronary bypass grafting with an atretic LIMA. The SVG to OM is  diffusely diseased with an ostial 50% stenosis and at least 20-30%  stenosis through the course of the vessel. 3. Prior balloon angioplasty site in the mid-LAD is widely patent 4. Mildly elevated left ventricular filling pressures (LVEPD = 17 mm Hg). Recommendations: 1. Aggressive secondary prevention 2. Continue double antiplatelet therapy with Aspirin and Plavix.  3. Follow up with primary cardiologist Complications: None Left main: Large-caliber vessel without disease Left anterior descending: Large-caliber diffusely diseased vessel without  any focal stenotic segment.  Diagonal 1: Small branching vessel with a 40% ostial lesion Diagonal 2: Small-caliber branching without significant lesions Diagonal 3: Small-caliber vessel with 30-40% stenosis in the mid segment.  Circumflex: Medium to caliber vessel without significant lesions Obtuse marginal 1: medium -caliber without disease. The distal vessel is  supplied by the SVG-OM vein graft.  Obtuse marginal 2: medium caliber branching vessel.  Right Coronary: Large-caliber without disease that is minimally diseased  with mild luminal irregularities.  Right posterior  descending: Large caliber vessel with mild luminal  irregularities  Right posterior laterals: medium-caliber vessel without significant disease Bypass Graft Angiography:  LIMA to LAD: Lesion: Subselective injection reveals atretic vessel  SVG to OM: Diffusely diseased with an ostial 50% stenosis and at least  20-30%  stenosis through the course of the vessel.   Echo: 06/26/2015 - Procedure narrative: Transthoracic echocardiography. The study was technically difficult. - Left ventricle: The cavity size was normal. There was mild concentric hypertrophy. Systolic function was normal. The estimated ejection fraction was in the range of 60% to 65%. Wall motion was normal; there were no regional wall motion abnormalities. Doppler parameters are consistent with abnormal left ventricular relaxation (grade 1 diastolic dysfunction). - Aortic valve: There was trivial regurgitation. - Left atrium: The atrium was mildly dilated. - Right ventricle: Systolic function was normal. - Pulmonary arteries: Systolic pressure was within the normalrange. Impressions: - Normal study.  ECG: 08/25/20016 SR, no acute changes  ASSESSMENT AND PLAN:   Principal Problem:   Unstable angina - admit, cycle enzymes - add heparin if ez elevate or ECG becomes abnl - cath in am to further define anatomy - ck P2Y12 since on Plavix  Otherwise, continue home medications with SSI, hold Mobic and metformin, ck A1c Active Problems:   HTN (hypertension)   DM (diabetes mellitus)   GERD (gastroesophageal reflux disease)   HLD (hyperlipidemia)   COPD (chronic obstructive pulmonary disease)  Signed, Rosaria Ferries, PA-C 07/11/2015 6:20 PM Beeper [phone]   I have seen and examined the patient along with Barrett, Rhonda, PA-C.  I have reviewed the chart, notes and new data.  I agree with PA's note.  Key new complaints: he appears to describe typical angina pectoris, similar to previous symptoms,  worsened by walking Key examination changes: no overt CHF or arrhythmia Key new findings / data: ECG and enzymes low risk  PLAN: 59 year old 14 years s/p anterior STEMI and emergency CABG, 2 years s/p DES-LCX, 3 months s/p POBA to mid LAD (atretic LIMA) PRESENTS WITH SYMPTOMS SUGGESTIVE OF UNSTABLE ANGINA. Athough he has recently had a repeat coronary angio, I find his symptoms compelling and he does not strike me as being drug seeking or a Risk manager. I think repeat coronary angio is appropriate. This procedure has been fully reviewed with the patient and written informed consent has been obtained.   Sanda Klein, MD, [place] [phone] 07/11/2015, 6:39 PM

## 2015-07-12 ENCOUNTER — Encounter (HOSPITAL_COMMUNITY)
Admission: EM | Disposition: A | Discharge status: Home / Self Care | Payer: Self-pay | Source: Home / Self Care | Attending: Emergency Medicine

## 2015-07-12 DIAGNOSIS — I25110 Atherosclerotic heart disease of native coronary artery with unstable angina pectoris: Secondary | ICD-10-CM

## 2015-07-12 DIAGNOSIS — I209 Angina pectoris, unspecified: Secondary | ICD-10-CM | POA: Insufficient documentation

## 2015-07-12 DIAGNOSIS — R0789 Other chest pain: Secondary | ICD-10-CM | POA: Diagnosis not present

## 2015-07-12 HISTORY — PX: CARDIAC CATHETERIZATION: SHX172

## 2015-07-12 LAB — LIPID PANEL
Cholesterol: 136 mg/dL (ref 0–200)
HDL: 35 mg/dL — ABNORMAL LOW (ref >40)
LDL Cholesterol: 78 mg/dL (ref 0–99)
Total CHOL/HDL Ratio: 3.9 RATIO
Triglycerides: 113 mg/dL (ref <150)
VLDL: 23 mg/dL (ref 0–40)

## 2015-07-12 LAB — TROPONIN I
Troponin I: <0.03 ng/mL (ref <0.031)
Troponin I: <0.03 ng/mL (ref <0.031)

## 2015-07-12 LAB — COMPREHENSIVE METABOLIC PANEL
ALT: 37 U/L (ref 17–63)
AST: 26 U/L (ref 15–41)
Albumin: 2.9 g/dL — ABNORMAL LOW (ref 3.5–5.0)
Alkaline Phosphatase: 46 U/L (ref 38–126)
Anion gap: 4 — ABNORMAL LOW (ref 5–15)
BUN: 8 mg/dL (ref 6–20)
CO2: 33 mmol/L — ABNORMAL HIGH (ref 22–32)
Calcium: 9 mg/dL (ref 8.9–10.3)
Chloride: 101 mmol/L (ref 101–111)
Creatinine, Ser: 1.15 mg/dL (ref 0.61–1.24)
GFR calc Af Amer: >60 mL/min (ref >60)
GFR calc non Af Amer: >60 mL/min (ref >60)
Glucose, Bld: 112 mg/dL — ABNORMAL HIGH (ref 65–99)
Potassium: 5 mmol/L (ref 3.5–5.1)
Sodium: 138 mmol/L (ref 135–145)
Total Bilirubin: 0.9 mg/dL (ref 0.3–1.2)
Total Protein: 5.3 g/dL — ABNORMAL LOW (ref 6.5–8.1)

## 2015-07-12 LAB — GLUCOSE, CAPILLARY
Glucose-Capillary: 95 mg/dL (ref 65–99)
Glucose-Capillary: 88 mg/dL (ref 65–99)
Glucose-Capillary: 88 mg/dL (ref 65–99)
Glucose-Capillary: 154 mg/dL — ABNORMAL HIGH (ref 65–99)

## 2015-07-12 LAB — HEMOGLOBIN A1C
Hgb A1c MFr Bld: 6.9 % — ABNORMAL HIGH (ref 4.8–5.6)
Mean Plasma Glucose: 151 mg/dL

## 2015-07-12 IMAGING — CR DG CHEST 2V
1 series · 2 of 2 positions shown · non-contrast
Comparison: none

REASON FOR EXAM: ELEVATED BP
COMMENTS:   May transport without cardiac monitor

[Series 3: w chest pa · 0.14mm/px · 2 of 2 slices shown]
[im 1/2]
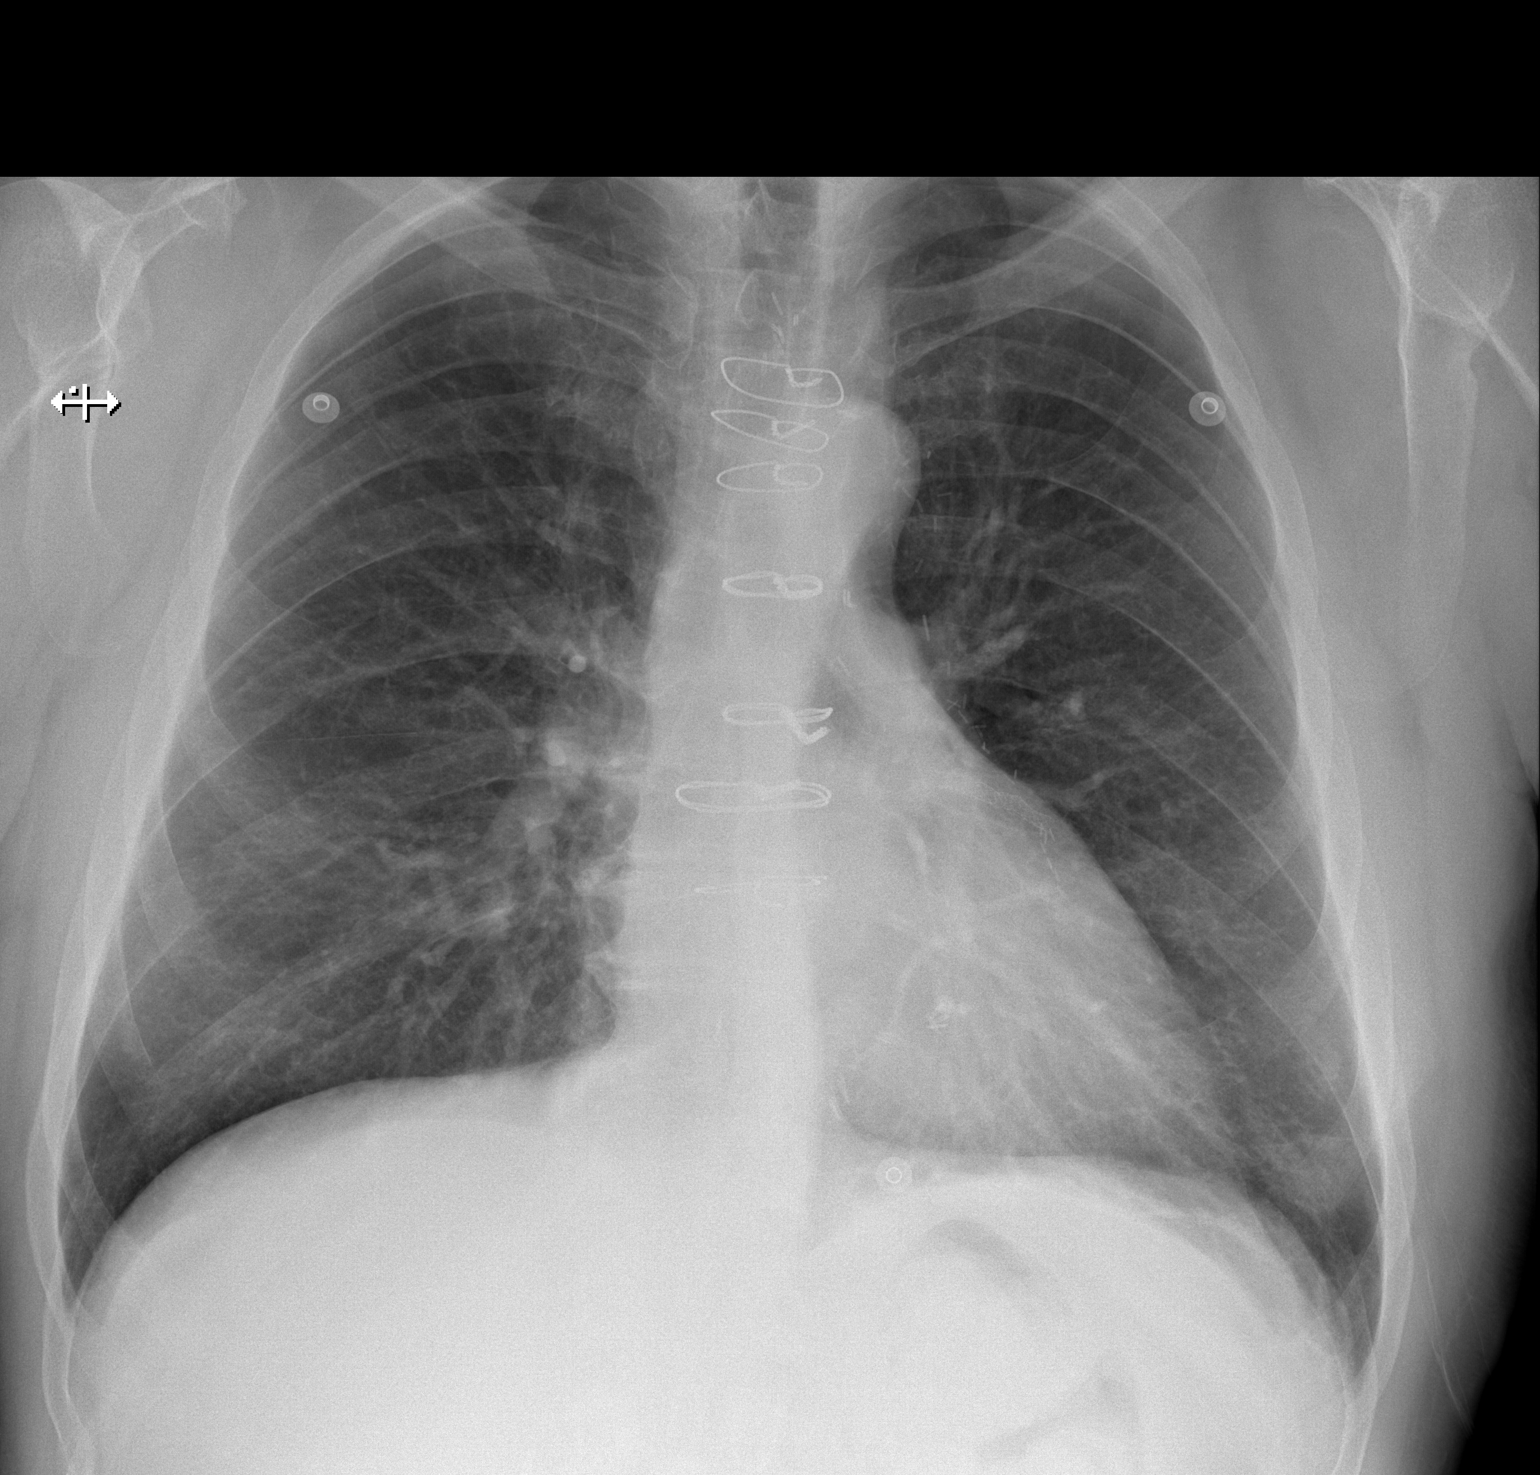
[im 2/2]
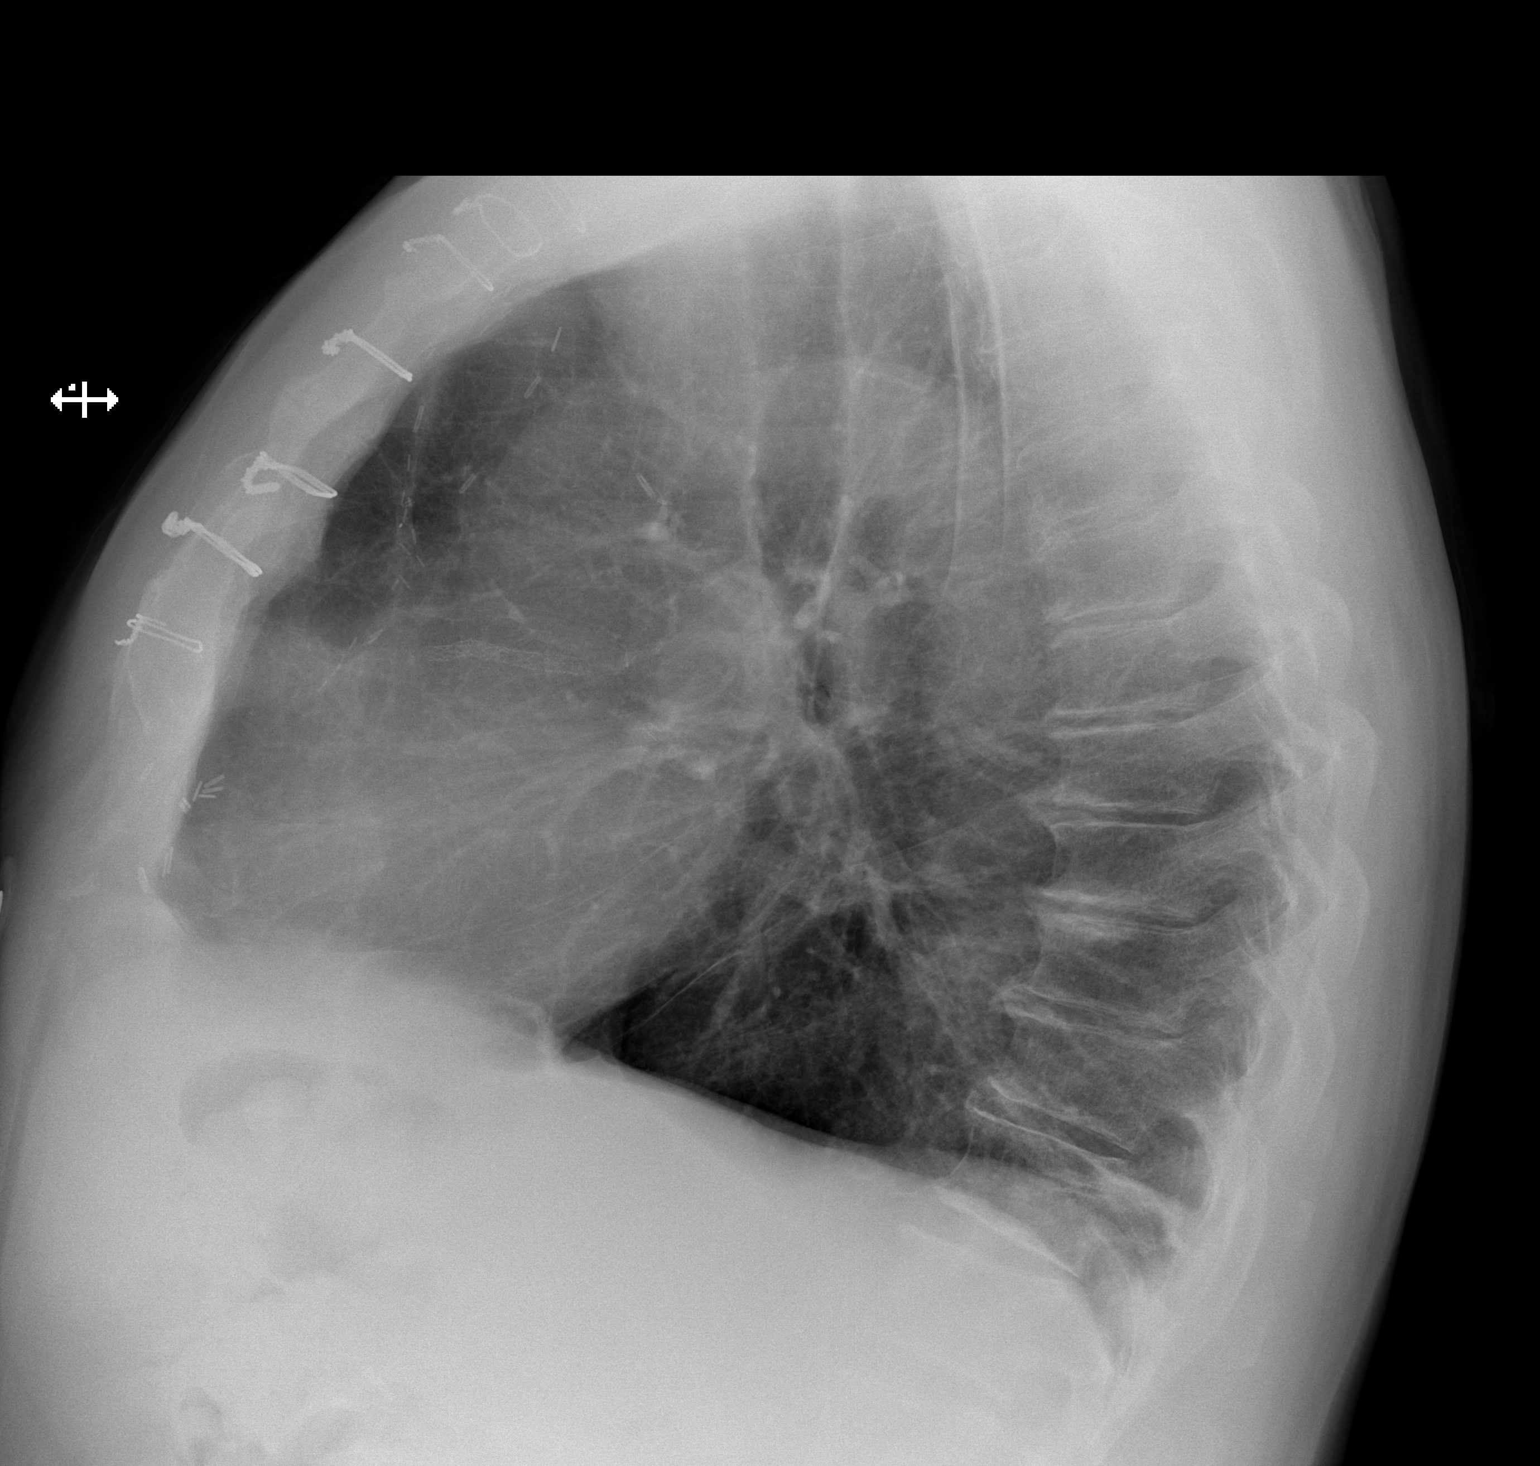

[2 of 2 positions shown; findings below may reference images not displayed]

PROCEDURE:     DXR - DXR CHEST PA (OR AP) AND LATERAL  - August 30, 2013 [DATE]

RESULT:     Comparison is made to the study August 12, 2013.

The lungs are mildly hyperinflated with hemidiaphragm flattening. There is
no focal infiltrate. There are coarse lung markings on the left projecting
over the lower thoracic spine which are not new and may reflect atelectasis
or scarring. The cardiac silhouette is normal in size. The pulmonary
vascularity is not engorged. The patient has undergone previous median
sternotomy. There is degenerative disc change at multiple levels of the
thoracic spine.
IMPRESSION: 1. There is mild hyperinflation consistent with COPD. There may be
atelectasis versus fibrosis in the left lower lobe posteriorly.
2. There is no evidence of CHF nor of pneumonia.

[REDACTED]

## 2015-07-12 SURGERY — LEFT HEART CATH AND CORS/GRAFTS ANGIOGRAPHY
Anesthesia: LOCAL

## 2015-07-12 MED ORDER — SODIUM CHLORIDE 0.9 % IJ SOLN: 3.0000 mL | INTRAMUSCULAR | Status: DC | PRN

## 2015-07-12 MED ORDER — LIDOCAINE HCL (PF) 1 % IJ SOLN
INTRAMUSCULAR | Status: AC
  Filled 2015-07-12: qty 30

## 2015-07-12 MED ORDER — OXYCODONE-ACETAMINOPHEN 5-325 MG PO TABS
1.0000 | ORAL_TABLET | ORAL | Status: DC | PRN
  Administered 2015-07-12 – 2015-07-13 (×4): 1 via ORAL
  Filled 2015-07-12 (×4): qty 1

## 2015-07-12 MED ORDER — ENOXAPARIN SODIUM 40 MG/0.4ML ~~LOC~~ SOLN
40.0000 mg | SUBCUTANEOUS | Status: DC
  Administered 2015-07-13: 40 mg via SUBCUTANEOUS
  Filled 2015-07-12: qty 0.4

## 2015-07-12 MED ORDER — HEPARIN SODIUM (PORCINE) 1000 UNIT/ML IJ SOLN
INTRAMUSCULAR | Status: AC
  Filled 2015-07-12: qty 1

## 2015-07-12 MED ORDER — HEPARIN (PORCINE) IN NACL 2-0.9 UNIT/ML-% IJ SOLN
INTRAMUSCULAR | Status: AC
  Filled 2015-07-12: qty 1000

## 2015-07-12 MED ORDER — VERAPAMIL HCL 2.5 MG/ML IV SOLN
INTRAVENOUS | Status: DC | PRN
  Administered 2015-07-12: 15:00:00 via INTRA_ARTERIAL

## 2015-07-12 MED ORDER — SODIUM CHLORIDE 0.9 % IV SOLN: 250.0000 mL | INTRAVENOUS | Status: DC | PRN

## 2015-07-12 MED ORDER — HEPARIN SODIUM (PORCINE) 1000 UNIT/ML IJ SOLN
INTRAMUSCULAR | Status: DC | PRN
  Administered 2015-07-12: 5000 [IU] via INTRAVENOUS

## 2015-07-12 MED ORDER — SODIUM CHLORIDE 0.9 % WEIGHT BASED INFUSION
1.0000 mL/kg/h | INTRAVENOUS | Status: AC
  Administered 2015-07-12: 1 mL/kg/h via INTRAVENOUS

## 2015-07-12 MED ORDER — IOHEXOL 350 MG/ML SOLN
INTRAVENOUS | Status: DC | PRN
  Administered 2015-07-12: 140 mL via INTRA_ARTERIAL

## 2015-07-12 MED ORDER — ACETAMINOPHEN 325 MG PO TABS: 650.0000 mg | ORAL_TABLET | ORAL | Status: DC | PRN

## 2015-07-12 MED ORDER — LIDOCAINE HCL (PF) 1 % IJ SOLN
INTRAMUSCULAR | Status: DC | PRN
  Administered 2015-07-12: 15:00:00

## 2015-07-12 MED ORDER — VERAPAMIL HCL 2.5 MG/ML IV SOLN
INTRAVENOUS | Status: AC
  Filled 2015-07-12: qty 2

## 2015-07-12 MED ORDER — SODIUM CHLORIDE 0.9 % IJ SOLN
3.0000 mL | Freq: Two times a day (BID) | INTRAMUSCULAR | Status: DC
  Administered 2015-07-12 – 2015-07-13 (×2): 3 mL via INTRAVENOUS

## 2015-07-12 SURGICAL SUPPLY — 9 items
CATH INFINITI 5FR MULTPACK ANG (CATHETERS) ×2 IMPLANT
CATH SITESEER 5F MULTI A 2 (CATHETERS) ×2 IMPLANT
DEVICE RAD COMP TR BAND LRG (VASCULAR PRODUCTS) ×2 IMPLANT
GLIDESHEATH SLEND A-KIT 6F 22G (SHEATH) ×2 IMPLANT
KIT HEART LEFT (KITS) ×2 IMPLANT
PACK CARDIAC CATHETERIZATION (CUSTOM PROCEDURE TRAY) ×2 IMPLANT
TRANSDUCER W/STOPCOCK (MISCELLANEOUS) ×2 IMPLANT
TUBING CIL FLEX 10 FLL-RA (TUBING) ×2 IMPLANT
WIRE SAFE-T 1.5MM-J .035X260CM (WIRE) ×2 IMPLANT

## 2015-07-12 NOTE — Telephone Encounter (Signed)
Left message for patient saying that order was faxed on 07/10/15.

## 2015-07-12 NOTE — Telephone Encounter (Signed)
Order to d/c oxygen was written on Wednesday. Do you know if it has been faxed? If not we can fax another order. Thanks.

## 2015-07-12 NOTE — Interval H&P Note (Signed)
Cath Lab Visit (complete for each Cath Lab visit)  Clinical Evaluation Leading to the Procedure:   ACS: Yes.    Non-ACS:    Anginal Classification: CCS III  Anti-ischemic medical therapy: Maximal Therapy (2 or more classes of medications)  Non-Invasive Test Results: No non-invasive testing performed  Prior CABG: Previous CABG      History and Physical Interval Note:  07/12/2015 2:36 PM  Lawrence [name]  has presented today for surgery, with the diagnosis of cp  The various methods of treatment have been discussed with the patient and family. After consideration of risks, benefits and other options for treatment, the patient has consented to  Procedure(s): Left Heart Cath and Cors/Grafts Angiography (N/A) as a surgical intervention .  The patient's history has been reviewed, patient examined, no change in status, stable for surgery.  I have reviewed the patient's chart and labs.  Questions were answered to the patient's satisfaction.     Sinclair Grooms

## 2015-07-12 NOTE — Telephone Encounter (Signed)
Please review. Thanks!  

## 2015-07-12 NOTE — H&P (View-Only) (Signed)
Patient Name: Lawrence [name] Date of Encounter: 07/12/2015  Primary Cardiologist: Dr Gennette Pac in [place] (last o.v. 04/2015)   Principal Problem:   Unstable angina Active Problems:   HTN (hypertension)   DM (diabetes mellitus)   GERD (gastroesophageal reflux disease)   HLD (hyperlipidemia)   COPD (chronic obstructive pulmonary disease)   Chest pain    SUBJECTIVE  Still has some chest pain this morning. Denies any SOB  CURRENT MEDS . aspirin EC  81 mg Oral q morning - 10a  . atorvastatin  80 mg Oral QHS  . clopidogrel  75 mg Oral Daily  . enoxaparin (LOVENOX) injection  40 mg Subcutaneous Q24H  . furosemide  40 mg Oral q morning - 10a  . insulin aspart  0-15 Units Subcutaneous TID WC  . insulin aspart  0-5 Units Subcutaneous QHS  . isosorbide mononitrate  60 mg Oral Daily  . lisinopril  20 mg Oral Daily  . loratadine  10 mg Oral Daily  . mometasone-formoterol  2 puff Inhalation BID  . omega-3 acid ethyl esters  4 g Oral Daily  . pantoprazole  40 mg Oral Daily  . potassium chloride  10 mEq Oral q morning - 10a  . sodium chloride  3 mL Intravenous Q12H  . sodium chloride  3 mL Intravenous Q12H  . tamsulosin  0.4 mg Oral Daily  . tiotropium  18 mcg Inhalation Daily    OBJECTIVE  Filed Vitals:   07/11/15 2001 07/11/15 2002 07/12/15 0005 07/12/15 0400  BP:  149/93 119/82 107/65  Pulse:  77 64 51  Temp:  97.9 F (36.6 C) 97.6 F (36.4 C) 97.7 F (36.5 C)  TempSrc:  Oral Oral Oral  Resp:   14 14  Height:  5' 7"  (1.702 m)    Weight:  220 lb 12.8 oz (100.154 kg)  219 lb 9.6 oz (99.61 kg)  SpO2: 96% 96% 90% 92%    Intake/Output Summary (Last 24 hours) at 07/12/15 0853 Last data filed at 07/12/15 0643  Gross per 24 hour  Intake 1014.2 ml  Output   1000 ml  Net   14.2 ml   Filed Weights   07/11/15 2002 07/12/15 0400  Weight: 220 lb 12.8 oz (100.154 kg) 219 lb 9.6 oz (99.61 kg)    PHYSICAL EXAM  General: Pleasant, NAD. Neuro: Alert and oriented X 3. Moves  all extremities spontaneously. Psych: Normal affect. HEENT:  Normal  Neck: Supple without bruits or JVD. Lungs:  Resp regular and unlabored. +bilateral expiratory wheezing.  Heart: RRR no s3, s4, or murmurs. Abdomen: Soft, non-tender, non-distended, BS + x 4.  Extremities: No clubbing, cyanosis or edema. DP/PT/Radials 2+ and equal bilaterally.  Accessory Clinical Findings  CBC  Recent Labs  07/11/15 1346  WBC 6.8  HGB 14.8  HCT 45.6  MCV 89.2  PLT 889*   Basic Metabolic Panel  Recent Labs  07/11/15 1346 07/12/15 0241  NA 139 138  K 4.1 5.0  CL 103 101  CO2 27 33*  GLUCOSE 147* 112*  BUN 8 8  CREATININE 0.99 1.15  CALCIUM 8.9 9.0   Liver Function Tests  Recent Labs  07/12/15 0241  AST 26  ALT 37  ALKPHOS 46  BILITOT 0.9  PROT 5.3*  ALBUMIN 2.9*   Cardiac Enzymes  Recent Labs  07/11/15 2100 07/12/15 0241 07/12/15 0748  TROPONINI <0.03 <0.03 <0.03   Hemoglobin A1C  Recent Labs  07/11/15 2100  HGBA1C 6.9*   Fasting Lipid Panel  Recent Labs  07/12/15 0241  CHOL 136  HDL 35*  LDLCALC 78  TRIG 113  CHOLHDL 3.9    TELE NSR with HR 50-60s    ECG  No new EKG  Echocardiogram 06/26/2015  LV EF: 60% -  65%  ------------------------------------------------------------------- Indications:   Dyspnea 786.09.  ------------------------------------------------------------------- Study Conclusions  - Procedure narrative: Transthoracic echocardiography. The study was technically difficult. - Left ventricle: The cavity size was normal. There was mild concentric hypertrophy. Systolic function was normal. The estimated ejection fraction was in the range of 60% to 65%. Wall motion was normal; there were no regional wall motion abnormalities. Doppler parameters are consistent with abnormal left ventricular relaxation (grade 1 diastolic dysfunction). - Aortic valve: There was trivial regurgitation. - Left atrium: The atrium  was mildly dilated. - Right ventricle: Systolic function was normal. - Pulmonary arteries: Systolic pressure was within the normal range.  Impressions:  - Normal study.     Radiology/Studies  Dg Chest 2 View  07/11/2015   CLINICAL DATA:  Chest pain shortness of breath when walking. Previously placed coronary artery stents.  EXAM: CHEST  2 VIEW  COMPARISON:  06/30/2015.  FINDINGS: Normal sized heart. Stable mildly prominent pulmonary vasculature and interstitial markings. Coronary artery stents are again noted as well as post CABG changes. Diffuse osteopenia. Thoracic spine degenerative changes.  IMPRESSION: No acute abnormality. Stable mild pulmonary vascular congestion and mild chronic interstitial lung disease.   Electronically Signed   By: Claudie Revering M.D.   On: 07/11/2015 14:22   Dg Chest 2 View  06/30/2015   CLINICAL DATA:  Chest pain. Shortness of breath and cough for 1 day.  EXAM: CHEST - 2 VIEW  COMPARISON:  Two-view chest x-ray 04/25/2015.  FINDINGS: The heart size is exaggerated by low lung volumes. Median sternotomy is noted. Mild pulmonary vascular congestion is evident. Linear density at the left base likely reflects atelectasis. No other significant airspace consolidation is present. The visualized soft tissues and bony thorax are unremarkable.  IMPRESSION: 1. Mild left base atelectasis. 2. Low lung volumes and mild pulmonary vascular congestion. 3. No other acute cardiopulmonary disease.   Electronically Signed   By: San Morelle M.D.   On: 06/30/2015 20:44   Dg Chest 2 View  06/25/2015   CLINICAL DATA:  Shortness of Breath  EXAM: CHEST  2 VIEW  COMPARISON:  June 16, 2015  FINDINGS: There is mild atelectasis in the left base. There is no edema or consolidation. Heart is upper normal in size with pulmonary vascularity within normal limits. Patient is status post internal mammary bypass grafting. No adenopathy. There is degenerative change in the thoracic spine. There is  mild anterior wedging of several lower thoracic vertebral bodies, stable.  IMPRESSION: Mild left base atelectasis. No edema or consolidation. No change in cardiac silhouette.   Electronically Signed   By: Lowella Grip III M.D.   On: 06/25/2015 22:36   Dg Chest 2 View  06/16/2015   CLINICAL DATA:  Persistent chest pain and shortness of breath. On home oxygen.  EXAM: CHEST  2 VIEW  COMPARISON:  05/10/2015 and 03/07/2015  FINDINGS: Sternotomy wires are unchanged. Lungs are adequately inflated with minimal linear atelectasis over the posterior right base. No evidence of effusion or lobar consolidation. Cardiomediastinal silhouette is within normal. Coronary stent is present. Mild calcified plaque over the thoracic aorta. Mild degenerative change of the spine.  IMPRESSION: No acute cardiopulmonary disease. Minimal linear atelectasis right base.   Electronically Signed  By: Marin Olp M.D.   On: 06/16/2015 21:39    ASSESSMENT AND PLAN  1. Chest pain  - Admitted [facility] 08/10-08/11 for chest pain. He was treated for COPD exacerbation and improved.   - ER visit 08/14 for SOB/Chest pain, rx w/ DuoNeb. Also given morphine injection for chronic back pain.  - serial trop negative.   - multiple cath at OSH recently, however continue to have symptom, plan for cardiac catheterization today. P2Y12 50, adequate platelet inhibition from plavix.  2. CAD s/p CABG 2002 x2 at [facility], [place] CFX 2014, POBA LAD 03/2015  - POBA LAD 03/2015, LIMA-LAD atretic   - cath 06/11/2015 [facility] heart cath: Non-obs disease  3. HTN 4. HLD 5. DM: A1C 6.9 6. COPD 7. GERD  Signed, Woodward Ku Pager: [phone]   Attending Note:   The patient was seen and examined.  Agree with assessment and plan as noted above.  Changes made to the above note as needed.  Very complicated story. I agree that cath is likely needed to sort out his pain  Scheduled for later today    Thayer Headings, Brooke Bonito., MD, [facility] 07/12/2015, 12:30  PM [address],  [place] Pager [phone]

## 2015-07-12 NOTE — Progress Notes (Signed)
Patient Name: Lawrence [name] Date of Encounter: 07/12/2015  Primary Cardiologist: Dr Gennette Pac in [place] (last o.v. 04/2015)   Principal Problem:   Unstable angina Active Problems:   HTN (hypertension)   DM (diabetes mellitus)   GERD (gastroesophageal reflux disease)   HLD (hyperlipidemia)   COPD (chronic obstructive pulmonary disease)   Chest pain    SUBJECTIVE  Still has some chest pain this morning. Denies any SOB  CURRENT MEDS . aspirin EC  81 mg Oral q morning - 10a  . atorvastatin  80 mg Oral QHS  . clopidogrel  75 mg Oral Daily  . enoxaparin (LOVENOX) injection  40 mg Subcutaneous Q24H  . furosemide  40 mg Oral q morning - 10a  . insulin aspart  0-15 Units Subcutaneous TID WC  . insulin aspart  0-5 Units Subcutaneous QHS  . isosorbide mononitrate  60 mg Oral Daily  . lisinopril  20 mg Oral Daily  . loratadine  10 mg Oral Daily  . mometasone-formoterol  2 puff Inhalation BID  . omega-3 acid ethyl esters  4 g Oral Daily  . pantoprazole  40 mg Oral Daily  . potassium chloride  10 mEq Oral q morning - 10a  . sodium chloride  3 mL Intravenous Q12H  . sodium chloride  3 mL Intravenous Q12H  . tamsulosin  0.4 mg Oral Daily  . tiotropium  18 mcg Inhalation Daily    OBJECTIVE  Filed Vitals:   07/11/15 2001 07/11/15 2002 07/12/15 0005 07/12/15 0400  BP:  149/93 119/82 107/65  Pulse:  77 64 51  Temp:  97.9 F (36.6 C) 97.6 F (36.4 C) 97.7 F (36.5 C)  TempSrc:  Oral Oral Oral  Resp:   14 14  Height:  5' 7"  (1.702 m)    Weight:  220 lb 12.8 oz (100.154 kg)  219 lb 9.6 oz (99.61 kg)  SpO2: 96% 96% 90% 92%    Intake/Output Summary (Last 24 hours) at 07/12/15 0853 Last data filed at 07/12/15 0643  Gross per 24 hour  Intake 1014.2 ml  Output   1000 ml  Net   14.2 ml   Filed Weights   07/11/15 2002 07/12/15 0400  Weight: 220 lb 12.8 oz (100.154 kg) 219 lb 9.6 oz (99.61 kg)    PHYSICAL EXAM  General: Pleasant, NAD. Neuro: Alert and oriented X 3. Moves  all extremities spontaneously. Psych: Normal affect. HEENT:  Normal  Neck: Supple without bruits or JVD. Lungs:  Resp regular and unlabored. +bilateral expiratory wheezing.  Heart: RRR no s3, s4, or murmurs. Abdomen: Soft, non-tender, non-distended, BS + x 4.  Extremities: No clubbing, cyanosis or edema. DP/PT/Radials 2+ and equal bilaterally.  Accessory Clinical Findings  CBC  Recent Labs  07/11/15 1346  WBC 6.8  HGB 14.8  HCT 45.6  MCV 89.2  PLT 096*   Basic Metabolic Panel  Recent Labs  07/11/15 1346 07/12/15 0241  NA 139 138  K 4.1 5.0  CL 103 101  CO2 27 33*  GLUCOSE 147* 112*  BUN 8 8  CREATININE 0.99 1.15  CALCIUM 8.9 9.0   Liver Function Tests  Recent Labs  07/12/15 0241  AST 26  ALT 37  ALKPHOS 46  BILITOT 0.9  PROT 5.3*  ALBUMIN 2.9*   Cardiac Enzymes  Recent Labs  07/11/15 2100 07/12/15 0241 07/12/15 0748  TROPONINI <0.03 <0.03 <0.03   Hemoglobin A1C  Recent Labs  07/11/15 2100  HGBA1C 6.9*   Fasting Lipid Panel  Recent Labs  07/12/15 0241  CHOL 136  HDL 35*  LDLCALC 78  TRIG 113  CHOLHDL 3.9    TELE NSR with HR 50-60s    ECG  No new EKG  Echocardiogram 06/26/2015  LV EF: 60% -  65%  ------------------------------------------------------------------- Indications:   Dyspnea 786.09.  ------------------------------------------------------------------- Study Conclusions  - Procedure narrative: Transthoracic echocardiography. The study was technically difficult. - Left ventricle: The cavity size was normal. There was mild concentric hypertrophy. Systolic function was normal. The estimated ejection fraction was in the range of 60% to 65%. Wall motion was normal; there were no regional wall motion abnormalities. Doppler parameters are consistent with abnormal left ventricular relaxation (grade 1 diastolic dysfunction). - Aortic valve: There was trivial regurgitation. - Left atrium: The atrium  was mildly dilated. - Right ventricle: Systolic function was normal. - Pulmonary arteries: Systolic pressure was within the normal range.  Impressions:  - Normal study.     Radiology/Studies  Dg Chest 2 View  07/11/2015   CLINICAL DATA:  Chest pain shortness of breath when walking. Previously placed coronary artery stents.  EXAM: CHEST  2 VIEW  COMPARISON:  06/30/2015.  FINDINGS: Normal sized heart. Stable mildly prominent pulmonary vasculature and interstitial markings. Coronary artery stents are again noted as well as post CABG changes. Diffuse osteopenia. Thoracic spine degenerative changes.  IMPRESSION: No acute abnormality. Stable mild pulmonary vascular congestion and mild chronic interstitial lung disease.   Electronically Signed   By: Claudie Revering M.D.   On: 07/11/2015 14:22   Dg Chest 2 View  06/30/2015   CLINICAL DATA:  Chest pain. Shortness of breath and cough for 1 day.  EXAM: CHEST - 2 VIEW  COMPARISON:  Two-view chest x-ray 04/25/2015.  FINDINGS: The heart size is exaggerated by low lung volumes. Median sternotomy is noted. Mild pulmonary vascular congestion is evident. Linear density at the left base likely reflects atelectasis. No other significant airspace consolidation is present. The visualized soft tissues and bony thorax are unremarkable.  IMPRESSION: 1. Mild left base atelectasis. 2. Low lung volumes and mild pulmonary vascular congestion. 3. No other acute cardiopulmonary disease.   Electronically Signed   By: San Morelle M.D.   On: 06/30/2015 20:44   Dg Chest 2 View  06/25/2015   CLINICAL DATA:  Shortness of Breath  EXAM: CHEST  2 VIEW  COMPARISON:  June 16, 2015  FINDINGS: There is mild atelectasis in the left base. There is no edema or consolidation. Heart is upper normal in size with pulmonary vascularity within normal limits. Patient is status post internal mammary bypass grafting. No adenopathy. There is degenerative change in the thoracic spine. There is  mild anterior wedging of several lower thoracic vertebral bodies, stable.  IMPRESSION: Mild left base atelectasis. No edema or consolidation. No change in cardiac silhouette.   Electronically Signed   By: Lowella Grip III M.D.   On: 06/25/2015 22:36   Dg Chest 2 View  06/16/2015   CLINICAL DATA:  Persistent chest pain and shortness of breath. On home oxygen.  EXAM: CHEST  2 VIEW  COMPARISON:  05/10/2015 and 03/07/2015  FINDINGS: Sternotomy wires are unchanged. Lungs are adequately inflated with minimal linear atelectasis over the posterior right base. No evidence of effusion or lobar consolidation. Cardiomediastinal silhouette is within normal. Coronary stent is present. Mild calcified plaque over the thoracic aorta. Mild degenerative change of the spine.  IMPRESSION: No acute cardiopulmonary disease. Minimal linear atelectasis right base.   Electronically Signed  By: Marin Olp M.D.   On: 06/16/2015 21:39    ASSESSMENT AND PLAN  1. Chest pain  - Admitted [facility] 08/10-08/11 for chest pain. He was treated for COPD exacerbation and improved.   - ER visit 08/14 for SOB/Chest pain, rx w/ DuoNeb. Also given morphine injection for chronic back pain.  - serial trop negative.   - multiple cath at OSH recently, however continue to have symptom, plan for cardiac catheterization today. P2Y12 50, adequate platelet inhibition from plavix.  2. CAD s/p CABG 2002 x2 at [facility] [place] CFX 2014, POBA LAD 03/2015  - POBA LAD 03/2015, LIMA-LAD atretic   - cath 06/11/2015 [facility] heart cath: Non-obs disease  3. HTN 4. HLD 5. DM: A1C 6.9 6. COPD 7. GERD  Signed, Woodward Ku Pager: [phone]   Attending Note:   The patient was seen and examined.  Agree with assessment and plan as noted above.  Changes made to the above note as needed.  Very complicated story. I agree that cath is likely needed to sort out his pain  Scheduled for later today    Lawrence [name], Brooke Bonito., MD, [facility] 07/12/2015, 12:30  PM [address],  [employer] Pager [phone]

## 2015-07-13 DIAGNOSIS — R0789 Other chest pain: Secondary | ICD-10-CM | POA: Diagnosis not present

## 2015-07-13 DIAGNOSIS — I200 Unstable angina: Secondary | ICD-10-CM | POA: Diagnosis not present

## 2015-07-13 DIAGNOSIS — E785 Hyperlipidemia, unspecified: Secondary | ICD-10-CM

## 2015-07-13 DIAGNOSIS — I10 Essential (primary) hypertension: Secondary | ICD-10-CM | POA: Diagnosis not present

## 2015-07-13 LAB — BASIC METABOLIC PANEL
Anion gap: 7 (ref 5–15)
BUN: 9 mg/dL (ref 6–20)
CO2: 35 mmol/L — ABNORMAL HIGH (ref 22–32)
Calcium: 9.5 mg/dL (ref 8.9–10.3)
Chloride: 98 mmol/L — ABNORMAL LOW (ref 101–111)
Creatinine, Ser: 1.15 mg/dL (ref 0.61–1.24)
GFR calc Af Amer: >60 mL/min (ref >60)
GFR calc non Af Amer: >60 mL/min (ref >60)
Glucose, Bld: 165 mg/dL — ABNORMAL HIGH (ref 65–99)
Potassium: 3.8 mmol/L (ref 3.5–5.1)
Sodium: 140 mmol/L (ref 135–145)

## 2015-07-13 LAB — GLUCOSE, CAPILLARY
Glucose-Capillary: 131 mg/dL — ABNORMAL HIGH (ref 65–99)
Glucose-Capillary: 103 mg/dL — ABNORMAL HIGH (ref 65–99)

## 2015-07-13 NOTE — Discharge Summary (Signed)
Discharge Summary   Patient ID: Lawrence [name],  MRN: [id number], DOB/AGE: October 03, 1956 59 y.o.  Admit date: 07/11/2015 Discharge date: 07/13/2015  Primary Care Provider: [name] Primary Cardiologist: Dr. Gennette Pac in [facility]  Discharge Diagnoses Principal Problem:   Unstable angina Active Problems:   HTN (hypertension)   DM (diabetes mellitus)   GERD (gastroesophageal reflux disease)   HLD (hyperlipidemia)   COPD (chronic obstructive pulmonary disease)   Chest pain   Angina at rest   Pain in the chest   Allergies Allergies  Allergen Reactions  . Albuterol Sulfate [Albuterol]     *ANTIASTHMATIC AND BRONCHODILATOR AGENTS*  . Demerol [Meperidine] Hives  . Sulfa Antibiotics Hives  . Morphine Sulfate Rash    Nausea and vomiting    Procedures  Cardiac catheterization 07/12/2015 Conclusion     Native vessel coronary disease with patent proximal to mid LAD stents containing up to 50% ISR, patent circumflex/obtuse marginal one bifurcation stent with 50-70% in-stent restenosis. Based upon prior catheterization don within the past 60 days at [facility], no significant change has occurred.   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 There is distal 60-70% stenosis.   Atresia of the LIMA to the LAD.   Normal left ventricular systolic function.  RECOMMENDATIONS:    Suspect noncardiac chest pain.      Hospital Course  The patient is a 59 year old male with past medical history of CABG 2002 x2 at [facility], [place] CFX 2014, POBA LAD 03/2015, HTN, HL, DM, GERD who presented to [facility] on 07/11/2015 for evaluation of chest pain. Patient had multiple cath in the recent several month. He had POBA LAD 03/2015, LIMA-LAD atretic. His last cardiac catheterization on 06/11/2015 which showed nonobstructive disease. He was  admitted to [facility] 08/10-08/11 for chest pain and was treated with COPD exacerbation and improved. He came back to the ED on 8/14 for shortness breath and chest pain and was given duoneb. He described the symptom as a squeezing chest tightness that is worse with activity.  After discussing various options with the patient, he agreed to undergo diagnostic cardiac catheterization. Overnight, his serial troponin was negative. A P2Y12 study shows adequate platelet inhibition from Plavix. He underwent a cardiac catheterization on 07/12/2615 which showed native vessel CAD with patent proximal to mid LAD stent containing up to 50% in-stent restenosis, patent left circumflex/OM1 bifurcation stent with 50-70% in-stent restenosis, he does have atretic LIMA to LAD, SVG with 50% ostial stenosis, ectasia in the mid body of the graft to OM1, and distal 60-70% stenosis, based upon prior cardiac catheterization done within the last 60 days at [facility], no significant change has been occurred, it is suspected he has noncardiac chest pain.  He was seen on the following morning on 07/13/2015, his chest pain was felt to be noncardiac. An repeat BMET is order, and stable.   Discharge Vitals Blood pressure 135/75, pulse 67, temperature 98.2 F (36.8 C), temperature source Oral, resp. rate 10, height 5' 7"  (1.702 m), weight 219 lb 9.6 oz (99.61 kg), SpO2 93 %.  Filed Weights   07/11/15 2002 07/12/15 0400  Weight: 220 lb 12.8 oz (100.154 kg) 219 lb 9.6 oz (99.61 kg)    Labs  CBC  Recent Labs  07/11/15 1346  WBC 6.8  HGB 14.8  HCT 45.6  MCV 89.2  PLT  676*   Basic Metabolic Panel  Recent Labs  07/12/15 0241 07/13/15 1245  NA 138 140  K 5.0 3.8  CL 101 98*  CO2 33* 35*  GLUCOSE 112* 165*  BUN 8 9  CREATININE 1.15 1.15  CALCIUM 9.0 9.5   Liver Function Tests  Recent Labs  07/12/15 0241  AST 26  ALT 37  ALKPHOS 46  BILITOT 0.9  PROT 5.3*  ALBUMIN 2.9*   Cardiac Enzymes  Recent Labs   07/11/15 2100 07/12/15 0241 07/12/15 0748  TROPONINI <0.03 <0.03 <0.03   Hemoglobin A1C  Recent Labs  07/11/15 2100  HGBA1C 6.9*   Fasting Lipid Panel  Recent Labs  07/12/15 0241  CHOL 136  HDL 35*  LDLCALC 78  TRIG 113  CHOLHDL 3.9    Disposition  Pt is being discharged home today in good condition.  Follow-up Plans & Appointments      Follow-up Information    Follow up with STOUFFER, Jenne Pane, MD.   Specialty:  Cardiology   Why:  Please followup with your primary cardiologist as soon as possible.    Contact information:   [address] [facility] [address] [place] [address] [phone]       Discharge Medications    Medication List    STOP taking these medications        predniSONE 20 MG tablet  Commonly known as:  DELTASONE      TAKE these medications        ADVAIR DISKUS 500-50 MCG/DOSE Aepb  Generic drug:  Fluticasone-Salmeterol  ONE PUFF EVERY 12 HOURS AS DIRECTED. RINSE MOUTH AFTER USING     albuterol 108 (90 BASE) MCG/ACT inhaler  Commonly known as:  PROVENTIL HFA;VENTOLIN HFA  Inhale 2 puffs into the lungs every 6 (six) hours as needed for wheezing.     ALPRAZolam 1 MG tablet  Commonly known as:  XANAX  TAKE 1/2-1 TABLET BY MOUTH 3 TIMES A DAY     aspirin EC 81 MG tablet  Take 81 mg by mouth every morning.     atorvastatin 80 MG tablet  Commonly known as:  LIPITOR  Take 1 tablet (80 mg total) by mouth at bedtime.     cetirizine 10 MG tablet  Commonly known as:  ZYRTEC  Take 10 mg by mouth at bedtime.     clopidogrel 75 MG tablet  Commonly known as:  PLAVIX  TAKE ONE (1) TABLET EACH DAY     furosemide 40 MG tablet  Commonly known as:  LASIX  Take 40 mg by mouth every morning.     hyoscyamine 0.125 MG SL tablet  Commonly known as:  LEVSIN SL  Place 0.125 mg under the tongue every 6 (six) hours as needed for cramping.     isosorbide mononitrate 60 MG 24 hr tablet  Commonly known as:  IMDUR    Take 60 mg by mouth daily.     lansoprazole 30 MG capsule  Commonly known as:  PREVACID  Take 30 mg by mouth every morning.     lisinopril 20 MG tablet  Commonly known as:  PRINIVIL,ZESTRIL  Take 20 mg by mouth daily.     magnesium citrate Soln  Take 296 mLs (1 Bottle total) by mouth once.     meloxicam 7.5 MG tablet  Commonly known as:  MOBIC  Take 1 tablet (7.5 mg total) by mouth daily.     metFORMIN 500 MG tablet  Commonly  known as:  GLUCOPHAGE  Take 1 tablet (500 mg total) by mouth every morning.     nitroGLYCERIN 0.4 MG SL tablet  Commonly known as:  NITROSTAT  Place 1 tablet (0.4 mg total) under the tongue every 5 (five) minutes as needed for chest pain. Every 5 minutes as needed for chest pain     omega-3 acid ethyl esters 1 G capsule  Commonly known as:  LOVAZA  TAKE FOUR CAPSULES BY MOUTH DAILY     oxyCODONE-acetaminophen 10-325 MG per tablet  Commonly known as:  PERCOCET  Take 1 tablet by mouth every 4 (four) hours as needed for pain.     polyethylene glycol packet  Commonly known as:  MIRALAX / GLYCOLAX  Take 17 g by mouth daily.     potassium chloride 10 MEQ tablet  Commonly known as:  K-DUR,KLOR-CON  Take 10 mEq by mouth every morning.     tamsulosin 0.4 MG Caps capsule  Commonly known as:  FLOMAX  Take 0.4 mg by mouth daily.     tiotropium 18 MCG inhalation capsule  Commonly known as:  SPIRIVA  Place 18 mcg into inhaler and inhale daily.         Duration of Discharge Encounter   Greater than 30 minutes including physician time.  Otilio Connors PA-C Pager: [phone] 07/13/2015, 4:43 PM

## 2015-07-13 NOTE — Progress Notes (Signed)
SUBJECTIVE:  No complaints  OBJECTIVE:   Vitals:   Filed Vitals:   07/13/15 0926 07/13/15 0927 07/13/15 0928 07/13/15 0929  BP: 135/75     Pulse: 50 53 58 67  Temp:      TempSrc:      Resp:      Height:      Weight:      SpO2: 100% 91% 97% 93%   I&O's:   Intake/Output Summary (Last 24 hours) at 07/13/15 1201 Last data filed at 07/13/15 1136  Gross per 24 hour  Intake 1574.99 ml  Output   3275 ml  Net -1700.01 ml   TELEMETRY: Reviewed telemetry pt in NSR:     PHYSICAL EXAM General: Well developed, well nourished, in no acute distress Head: Eyes PERRLA, No xanthomas.   Normal cephalic and atramatic  Lungs:   Clear bilaterally to auscultation and percussion. Heart:   HRRR S1 S2 Pulses are 2+ & equal. Abdomen: Bowel sounds are positive, abdomen soft and non-tender without masses Extremities:   No clubbing, cyanosis or edema.  DP +1 Neuro: Alert and oriented X 3. Psych:  Good affect, responds appropriately   LABS: Basic Metabolic Panel:  Recent Labs  07/11/15 1346 07/12/15 0241  NA 139 138  K 4.1 5.0  CL 103 101  CO2 27 33*  GLUCOSE 147* 112*  BUN 8 8  CREATININE 0.99 1.15  CALCIUM 8.9 9.0   Liver Function Tests:  Recent Labs  07/12/15 0241  AST 26  ALT 37  ALKPHOS 46  BILITOT 0.9  PROT 5.3*  ALBUMIN 2.9*   No results for input(s): LIPASE, AMYLASE in the last 72 hours. CBC:  Recent Labs  07/11/15 1346  WBC 6.8  HGB 14.8  HCT 45.6  MCV 89.2  PLT 133*   Cardiac Enzymes:  Recent Labs  07/11/15 2100 07/12/15 0241 07/12/15 0748  TROPONINI <0.03 <0.03 <0.03   BNP: Invalid input(s): POCBNP D-Dimer: No results for input(s): DDIMER in the last 72 hours. Hemoglobin A1C:  Recent Labs  07/11/15 2100  HGBA1C 6.9*   Fasting Lipid Panel:  Recent Labs  07/12/15 0241  CHOL 136  HDL 35*  LDLCALC 78  TRIG 113  CHOLHDL 3.9   Thyroid Function Tests: No results for input(s): TSH, T4TOTAL, T3FREE, THYROIDAB in the last 72  hours.  Invalid input(s): FREET3 Anemia Panel: No results for input(s): VITAMINB12, FOLATE, FERRITIN, TIBC, IRON, RETICCTPCT in the last 72 hours. Coag Panel:   Lab Results  Component Value Date   INR 0.99 07/11/2015   INR 1.1 05/22/2014   INR 1.0 12/05/2013    RADIOLOGY: Dg Chest 2 View  07/11/2015   CLINICAL DATA:  Chest pain shortness of breath when walking. Previously placed coronary artery stents.  EXAM: CHEST  2 VIEW  COMPARISON:  06/30/2015.  FINDINGS: Normal sized heart. Stable mildly prominent pulmonary vasculature and interstitial markings. Coronary artery stents are again noted as well as post CABG changes. Diffuse osteopenia. Thoracic spine degenerative changes.  IMPRESSION: No acute abnormality. Stable mild pulmonary vascular congestion and mild chronic interstitial lung disease.   Electronically Signed   By: Claudie Revering M.D.   On: 07/11/2015 14:22   Dg Chest 2 View  06/30/2015   CLINICAL DATA:  Chest pain. Shortness of breath and cough for 1 day.  EXAM: CHEST - 2 VIEW  COMPARISON:  Two-view chest x-ray 04/25/2015.  FINDINGS: The heart size is exaggerated by low lung volumes. Median sternotomy is noted. Mild pulmonary  vascular congestion is evident. Linear density at the left base likely reflects atelectasis. No other significant airspace consolidation is present. The visualized soft tissues and bony thorax are unremarkable.  IMPRESSION: 1. Mild left base atelectasis. 2. Low lung volumes and mild pulmonary vascular congestion. 3. No other acute cardiopulmonary disease.   Electronically Signed   By: San Morelle M.D.   On: 06/30/2015 20:44   Dg Chest 2 View  06/25/2015   CLINICAL DATA:  Shortness of Breath  EXAM: CHEST  2 VIEW  COMPARISON:  June 16, 2015  FINDINGS: There is mild atelectasis in the left base. There is no edema or consolidation. Heart is upper normal in size with pulmonary vascularity within normal limits. Patient is status post internal mammary bypass grafting.  No adenopathy. There is degenerative change in the thoracic spine. There is mild anterior wedging of several lower thoracic vertebral bodies, stable.  IMPRESSION: Mild left base atelectasis. No edema or consolidation. No change in cardiac silhouette.   Electronically Signed   By: Lowella Grip III M.D.   On: 06/25/2015 22:36   Dg Chest 2 View  06/16/2015   CLINICAL DATA:  Persistent chest pain and shortness of breath. On home oxygen.  EXAM: CHEST  2 VIEW  COMPARISON:  05/10/2015 and 03/07/2015  FINDINGS: Sternotomy wires are unchanged. Lungs are adequately inflated with minimal linear atelectasis over the posterior right base. No evidence of effusion or lobar consolidation. Cardiomediastinal silhouette is within normal. Coronary stent is present. Mild calcified plaque over the thoracic aorta. Mild degenerative change of the spine.  IMPRESSION: No acute cardiopulmonary disease. Minimal linear atelectasis right base.   Electronically Signed   By: Marin Olp M.D.   On: 06/16/2015 21:39    ASSESSMENT AND PLAN  1. Chest pain - Admitted [facility] 08/10-08/11 for chest pain. He was treated for COPD exacerbation and improved.  - ER visit 08/14 for SOB/Chest pain, rx w/ DuoNeb. Also given morphine injection for chronic back pain. - serial trop negative.  - multiple cath at OSH recently, however continue to have symptom  - cath yesterday with the following:   Native vessel coronary disease with patent proximal to mid LAD stents containing up to 50% ISR, patent circumflex/obtuse marginal one bifurcation stent with 50-70% in-stent restenosis. Based upon prior catheterization done within the past 60 days at [facility], no significant change has occurred.   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 There is distal 60-70%  stenosis.   Atresia of the LIMA to the LAD.   Normal left ventricular systolic function.  - suspected noncardiac CP   2. CAD s/p CABG 2002 x2 at [facility] [place] CFX 2014, POBA LAD 03/2015 - POBA LAD 03/2015, LIMA-LAD atretic  - cath 06/11/2015 [facility] heart cath: Non-obs disease  3. HTN 4. HLD 5. DM: A1C 6.9 6. COPD 7. GERD  Patient stable from a cardiac standpoint for discharge if repeat BMET stable.   Plan for followup with primary Cardiologist at [facility], [place]  07/13/2015  12:01 PM

## 2015-07-13 NOTE — Discharge Instructions (Signed)
No driving for 24 hours. No lifting over 5 lbs for 1 week. No sexual activity for 1 week. Keep procedure site clean & dry. If you notice increased pain, swelling, bleeding or pus, call/return!  You may shower, but no soaking baths/hot tubs/pools for 1 week.   Please hold Metformin for at least 48 hours after cath. You can restart on Monday 07/15/2015   Chest Pain (Nonspecific) It is often hard to give a diagnosis for the cause of chest pain. There is always a chance that your pain could be related to something serious, such as a heart attack or a blood clot in the lungs. You need to follow up with your doctor. HOME CARE  If antibiotic medicine was given, take it as directed by your doctor. Finish the medicine even if you start to feel better.  For the next few days, avoid activities that bring on chest pain. Continue physical activities as told by your doctor.  Do not use any tobacco products. This includes cigarettes, chewing tobacco, and e-cigarettes.  Avoid drinking alcohol.  Only take medicine as told by your doctor.  Follow your doctor's suggestions for more testing if your chest pain does not go away.  Keep all doctor visits you made. GET HELP IF:  Your chest pain does not go away, even after treatment.  You have a rash with blisters on your chest.  You have a fever. GET HELP RIGHT AWAY IF:   You have more pain or pain that spreads to your arm, neck, jaw, back, or belly (abdomen).  You have shortness of breath.  You cough more than usual or cough up blood.  You have very bad back or belly pain.  You feel sick to your stomach (nauseous) or throw up (vomit).  You have very bad weakness.  You pass out (faint).  You have chills. This is an emergency. Do not wait to see if the problems will go away. Call your local emergency services (911 in [place] Do not drive yourself to the hospital. MAKE SURE YOU:   Understand these instructions.  Will watch your  condition.  Will get help right away if you are not doing well or get worse. Document Released: 04/20/2008 Document Revised: 11/07/2013 Document Reviewed: 04/20/2008 [facility] Patient Information 2015 [place], [place]. This information is not intended to replace advice given to you by your health care provider. Make sure you discuss any questions you have with your health care provider.

## 2015-07-15 ENCOUNTER — Telehealth: Payer: Self-pay | Admitting: Cardiovascular Disease

## 2015-07-15 ENCOUNTER — Encounter (HOSPITAL_COMMUNITY): Payer: Self-pay | Admitting: Interventional Cardiology

## 2015-07-15 NOTE — Telephone Encounter (Signed)
D/C phone call . Will f/u w/ Cardiologist in [place] . Dr. Gennette Pac   Thanks

## 2015-07-15 NOTE — Telephone Encounter (Signed)
Patient contacted regarding discharge from [facility] on 07/13/15.  Patient understands to follow up with provider Dr. Gennette Pac (cardiologist in [place]) on 07/16/15 at unknown at unkown. Patient understands discharge instructions? YES Patient understands medications and regiment? YES Patient understands to bring all medications to this visit? n/a  Patient reports his BP is 107/70 and HR 90

## 2015-07-15 NOTE — Telephone Encounter (Signed)
Lawrence [name] is returning your phone call  [name]

## 2015-07-15 NOTE — Telephone Encounter (Signed)
LMTCB

## 2015-07-31 ENCOUNTER — Observation Stay
Admission: EM | Admit: 2015-07-31 | Discharge: 2015-08-02 | Disposition: A | Discharge status: Home / Self Care | Payer: Medicare PPO | Attending: Internal Medicine | Admitting: Internal Medicine

## 2015-07-31 ENCOUNTER — Emergency Department: Payer: Medicare PPO

## 2015-07-31 DIAGNOSIS — E782 Mixed hyperlipidemia: Secondary | ICD-10-CM | POA: Diagnosis present

## 2015-07-31 DIAGNOSIS — I208 Other forms of angina pectoris: Secondary | ICD-10-CM

## 2015-07-31 DIAGNOSIS — I25119 Atherosclerotic heart disease of native coronary artery with unspecified angina pectoris: Secondary | ICD-10-CM | POA: Diagnosis not present

## 2015-07-31 DIAGNOSIS — Z87891 Personal history of nicotine dependence: Secondary | ICD-10-CM | POA: Diagnosis not present

## 2015-07-31 DIAGNOSIS — J441 Chronic obstructive pulmonary disease with (acute) exacerbation: Secondary | ICD-10-CM | POA: Diagnosis not present

## 2015-07-31 DIAGNOSIS — J45909 Unspecified asthma, uncomplicated: Secondary | ICD-10-CM | POA: Diagnosis not present

## 2015-07-31 DIAGNOSIS — N400 Enlarged prostate without lower urinary tract symptoms: Secondary | ICD-10-CM | POA: Diagnosis present

## 2015-07-31 DIAGNOSIS — K220 Achalasia of cardia: Secondary | ICD-10-CM | POA: Diagnosis not present

## 2015-07-31 DIAGNOSIS — J449 Chronic obstructive pulmonary disease, unspecified: Secondary | ICD-10-CM | POA: Diagnosis present

## 2015-07-31 DIAGNOSIS — E119 Type 2 diabetes mellitus without complications: Secondary | ICD-10-CM | POA: Diagnosis not present

## 2015-07-31 DIAGNOSIS — R079 Chest pain, unspecified: Secondary | ICD-10-CM

## 2015-07-31 DIAGNOSIS — I5023 Acute on chronic systolic (congestive) heart failure: Secondary | ICD-10-CM | POA: Diagnosis present

## 2015-07-31 DIAGNOSIS — R0989 Other specified symptoms and signs involving the circulatory and respiratory systems: Secondary | ICD-10-CM | POA: Diagnosis not present

## 2015-07-31 DIAGNOSIS — I5033 Acute on chronic diastolic (congestive) heart failure: Secondary | ICD-10-CM | POA: Diagnosis not present

## 2015-07-31 DIAGNOSIS — Z23 Encounter for immunization: Secondary | ICD-10-CM | POA: Diagnosis not present

## 2015-07-31 DIAGNOSIS — M8580 Other specified disorders of bone density and structure, unspecified site: Secondary | ICD-10-CM | POA: Diagnosis not present

## 2015-07-31 DIAGNOSIS — J849 Interstitial pulmonary disease, unspecified: Secondary | ICD-10-CM | POA: Insufficient documentation

## 2015-07-31 DIAGNOSIS — Z882 Allergy status to sulfonamides status: Secondary | ICD-10-CM | POA: Insufficient documentation

## 2015-07-31 DIAGNOSIS — I10 Essential (primary) hypertension: Secondary | ICD-10-CM | POA: Insufficient documentation

## 2015-07-31 DIAGNOSIS — R112 Nausea with vomiting, unspecified: Secondary | ICD-10-CM | POA: Diagnosis not present

## 2015-07-31 DIAGNOSIS — K900 Celiac disease: Secondary | ICD-10-CM | POA: Insufficient documentation

## 2015-07-31 DIAGNOSIS — R001 Bradycardia, unspecified: Secondary | ICD-10-CM | POA: Diagnosis not present

## 2015-07-31 DIAGNOSIS — Z888 Allergy status to other drugs, medicaments and biological substances status: Secondary | ICD-10-CM | POA: Insufficient documentation

## 2015-07-31 DIAGNOSIS — Z951 Presence of aortocoronary bypass graft: Secondary | ICD-10-CM | POA: Insufficient documentation

## 2015-07-31 DIAGNOSIS — I5032 Chronic diastolic (congestive) heart failure: Secondary | ICD-10-CM | POA: Diagnosis present

## 2015-07-31 DIAGNOSIS — Z794 Long term (current) use of insulin: Secondary | ICD-10-CM | POA: Diagnosis not present

## 2015-07-31 DIAGNOSIS — Z885 Allergy status to narcotic agent status: Secondary | ICD-10-CM | POA: Insufficient documentation

## 2015-07-31 DIAGNOSIS — E785 Hyperlipidemia, unspecified: Secondary | ICD-10-CM | POA: Diagnosis not present

## 2015-07-31 DIAGNOSIS — I2510 Atherosclerotic heart disease of native coronary artery without angina pectoris: Secondary | ICD-10-CM | POA: Diagnosis present

## 2015-07-31 DIAGNOSIS — F419 Anxiety disorder, unspecified: Secondary | ICD-10-CM | POA: Diagnosis not present

## 2015-07-31 DIAGNOSIS — I209 Angina pectoris, unspecified: Secondary | ICD-10-CM | POA: Diagnosis present

## 2015-07-31 DIAGNOSIS — Z955 Presence of coronary angioplasty implant and graft: Secondary | ICD-10-CM | POA: Insufficient documentation

## 2015-07-31 DIAGNOSIS — I252 Old myocardial infarction: Secondary | ICD-10-CM | POA: Insufficient documentation

## 2015-07-31 DIAGNOSIS — K219 Gastro-esophageal reflux disease without esophagitis: Secondary | ICD-10-CM | POA: Diagnosis not present

## 2015-07-31 DIAGNOSIS — I5022 Chronic systolic (congestive) heart failure: Secondary | ICD-10-CM | POA: Diagnosis present

## 2015-07-31 DIAGNOSIS — R42 Dizziness and giddiness: Secondary | ICD-10-CM | POA: Diagnosis not present

## 2015-07-31 DIAGNOSIS — E1121 Type 2 diabetes mellitus with diabetic nephropathy: Secondary | ICD-10-CM

## 2015-07-31 HISTORY — DX: Chronic diastolic (congestive) heart failure: I50.32

## 2015-07-31 HISTORY — DX: Personal history of nicotine dependence: Z87.891

## 2015-07-31 HISTORY — DX: Atherosclerotic heart disease of native coronary artery without angina pectoris: I25.10

## 2015-07-31 HISTORY — DX: Supraventricular tachycardia, unspecified: I47.10

## 2015-07-31 HISTORY — DX: Supraventricular tachycardia: I47.1

## 2015-07-31 HISTORY — DX: Type 2 diabetes mellitus without complications: E11.9

## 2015-07-31 HISTORY — DX: Essential (primary) hypertension: I10

## 2015-07-31 HISTORY — DX: Anxiety disorder, unspecified: F41.9

## 2015-07-31 LAB — BASIC METABOLIC PANEL
Anion gap: 7 (ref 5–15)
BUN: 16 mg/dL (ref 6–20)
CO2: 28 mmol/L (ref 22–32)
Calcium: 9.1 mg/dL (ref 8.9–10.3)
Chloride: 105 mmol/L (ref 101–111)
Creatinine, Ser: 1.07 mg/dL (ref 0.61–1.24)
GFR calc Af Amer: >60 mL/min (ref >60)
GFR calc non Af Amer: >60 mL/min (ref >60)
Glucose, Bld: 92 mg/dL (ref 65–99)
Potassium: 3.7 mmol/L (ref 3.5–5.1)
Sodium: 140 mmol/L (ref 135–145)

## 2015-07-31 LAB — TROPONIN I: Troponin I: <0.03 ng/mL (ref <0.031)

## 2015-07-31 LAB — CBC
HCT: 44.2 % (ref 40.0–52.0)
Hemoglobin: 14.6 g/dL (ref 13.0–18.0)
MCH: 28.9 pg (ref 26.0–34.0)
MCHC: 33 g/dL (ref 32.0–36.0)
MCV: 87.5 fL (ref 80.0–100.0)
Platelets: 234 10*3/uL (ref 150–440)
RBC: 5.05 MIL/uL (ref 4.40–5.90)
RDW: 15.3 % — ABNORMAL HIGH (ref 11.5–14.5)
WBC: 6.6 10*3/uL (ref 3.8–10.6)

## 2015-07-31 MED ORDER — ONDANSETRON HCL 4 MG/2ML IJ SOLN
4.0000 mg | Freq: Once | INTRAMUSCULAR | Status: AC
  Administered 2015-07-31: 4 mg via INTRAVENOUS

## 2015-07-31 MED ORDER — ONDANSETRON HCL 4 MG/2ML IJ SOLN
INTRAMUSCULAR | Status: AC
  Administered 2015-07-31: 4 mg via INTRAVENOUS
  Filled 2015-07-31: qty 2

## 2015-07-31 MED ORDER — FUROSEMIDE 10 MG/ML IJ SOLN
40.0000 mg | Freq: Once | INTRAMUSCULAR | Status: AC
  Administered 2015-07-31: 40 mg via INTRAVENOUS

## 2015-07-31 MED ORDER — MORPHINE SULFATE (PF) 4 MG/ML IV SOLN
INTRAVENOUS | Status: AC
  Administered 2015-07-31: 4 mg via INTRAVENOUS
  Filled 2015-07-31: qty 1

## 2015-07-31 MED ORDER — MORPHINE SULFATE (PF) 4 MG/ML IV SOLN
4.0000 mg | Freq: Once | INTRAVENOUS | Status: AC
  Administered 2015-07-31: 4 mg via INTRAVENOUS

## 2015-07-31 MED ORDER — FUROSEMIDE 10 MG/ML IJ SOLN
INTRAMUSCULAR | Status: AC
  Administered 2015-07-31: 40 mg via INTRAVENOUS
  Filled 2015-07-31: qty 4

## 2015-07-31 NOTE — ED Provider Notes (Signed)
[facility] Emergency Department Provider Note  ____________________________________________  Time seen: 11:15 PM  I have reviewed the triage vital signs and the nursing notes.   HISTORY  Chief Complaint Hypertension and Chest Pain     HPI Lawrence [name] is a 59 y.o. male presents with acute onset of central chest tightness that is nonradiating dyspnea. Patient denies any dizziness or diaphoresis however does admit to mild dyspnea and bilateral lower extremity swelling. Of note patient was recently seen at [facility] on 07/11/2015 for same complaint as well as [facility] last Friday and total patient has 15 ED visits in the last 6 months for the same. Review of those visits revealed 2 catheterizations performed both at [place] with similar findings.   Past Medical History  Diagnosis Date  . Hypertension   . Coronary artery disease   . COPD (chronic obstructive pulmonary disease)   . Chronic bronchitis   . Emphysema lung   . Diabetes mellitus without complication   . Asthma   . Celiac disease   . CHF (congestive heart failure)     Patient Active Problem List   Diagnosis Date Noted  . Angina at rest   . Pain in the chest   . Unstable angina 07/11/2015  . Chest pain 07/11/2015  . COPD (chronic obstructive pulmonary disease) 07/03/2015  . COPD exacerbation 06/26/2015  . CAD (coronary artery disease) 06/26/2015  . HTN (hypertension) 06/26/2015  . DM (diabetes mellitus) 06/26/2015  . Achalasia 07/24/2014  . GERD (gastroesophageal reflux disease) 06/07/2014  . Former tobacco use 04/11/2013  . Chest pain at rest 04/09/2013  . HLD (hyperlipidemia) 04/09/2013    Past Surgical History  Procedure Laterality Date  . Vascular surgery    . Bypass graft    . Coronary angioplasty with stent placement  2014    DES CFX  . Tonsillectomy    . Cholecystectomy    . Esophageal dilation    . Coronary angioplasty  03/2015    POBA LAD at [facility]  .  Cardiac catheterization  05/2015    at [facility] [place], LAD diffuse dz, D1 40%, D2 OK, D3 40%, CFX OK, OM1 OK, OM2 OK, RCA OK, LIMA-LAD atretic, SVG-OM 50%  . Cardiac catheterization N/A 07/12/2015    Procedure: Left Heart Cath and Cors/Grafts Angiography;  Surgeon: Belva Crome, MD;  Location: [facility];  Service: Cardiovascular;  Laterality: N/A;    Current Outpatient Rx  Name  Route  Sig  Dispense  Refill  . ADVAIR DISKUS 500-50 MCG/DOSE AEPB      ONE PUFF EVERY 12 HOURS AS DIRECTED. RINSE MOUTH AFTER USING   60 each   12   . albuterol (PROVENTIL HFA;VENTOLIN HFA) 108 (90 BASE) MCG/ACT inhaler   Inhalation   Inhale 2 puffs into the lungs every 6 (six) hours as needed for wheezing.   1 Inhaler   0   . ALPRAZolam (XANAX) 1 MG tablet      TAKE 1/2-1 TABLET BY MOUTH 3 TIMES A DAY   90 tablet   3   . aspirin EC 81 MG tablet   Oral   Take 81 mg by mouth every morning.         Marland Kitchen atorvastatin (LIPITOR) 80 MG tablet   Oral   Take 1 tablet (80 mg total) by mouth at bedtime.   90 tablet   3   . cetirizine (ZYRTEC) 10 MG tablet   Oral  Take 10 mg by mouth at bedtime.          . clopidogrel (PLAVIX) 75 MG tablet      TAKE ONE (1) TABLET EACH DAY   30 tablet   5   . furosemide (LASIX) 40 MG tablet   Oral   Take 40 mg by mouth every morning.         . hyoscyamine (LEVSIN SL) 0.125 MG SL tablet   Sublingual   Place 0.125 mg under the tongue every 6 (six) hours as needed for cramping.          . isosorbide mononitrate (IMDUR) 60 MG 24 hr tablet   Oral   Take 60 mg by mouth daily.          . lansoprazole (PREVACID) 30 MG capsule   Oral   Take 30 mg by mouth every morning.          Marland Kitchen lisinopril (PRINIVIL,ZESTRIL) 20 MG tablet   Oral   Take 20 mg by mouth daily.         . magnesium citrate SOLN   Oral   Take 296 mLs (1 Bottle total) by mouth once. Patient not taking: Reported on 07/01/2015   195 mL   0   . meloxicam (MOBIC) 7.5 MG  tablet   Oral   Take 1 tablet (7.5 mg total) by mouth daily. Patient not taking: Reported on 07/01/2015   10 tablet   0   . metFORMIN (GLUCOPHAGE) 500 MG tablet   Oral   Take 1 tablet (500 mg total) by mouth every morning.   90 tablet   4   . nitroGLYCERIN (NITROSTAT) 0.4 MG SL tablet   Sublingual   Place 1 tablet (0.4 mg total) under the tongue every 5 (five) minutes as needed for chest pain. Every 5 minutes as needed for chest pain   30 tablet   1   . omega-3 acid ethyl esters (LOVAZA) 1 G capsule      TAKE FOUR CAPSULES BY MOUTH DAILY   120 capsule   12   . oxyCODONE-acetaminophen (PERCOCET) 10-325 MG per tablet   Oral   Take 1 tablet by mouth every 4 (four) hours as needed for pain.   150 tablet   0   . polyethylene glycol (MIRALAX / GLYCOLAX) packet   Oral   Take 17 g by mouth daily. Patient not taking: Reported on 07/01/2015   14 each   0   . potassium chloride (K-DUR,KLOR-CON) 10 MEQ tablet   Oral   Take 10 mEq by mouth every morning.         . tamsulosin (FLOMAX) 0.4 MG CAPS capsule   Oral   Take 0.4 mg by mouth daily.         Marland Kitchen tiotropium (SPIRIVA) 18 MCG inhalation capsule   Inhalation   Place 18 mcg into inhaler and inhale daily.           Allergies Albuterol sulfate; Demerol; Sulfa antibiotics; and Morphine sulfate  Family History  Problem Relation Age of Onset  . Heart attack Mother   . Cirrhosis Mother   . Diabetes Mother   . Depression Mother   . Heart disease Mother   . Heart attack Father   . Diabetes Father   . Depression Father   . Heart disease Father   . Parkinson's disease Brother     Social History Social History  Substance Use Topics  . Smoking status: Former Smoker  Quit date: 04/22/2013  . Smokeless tobacco: Never Used  . Alcohol Use: No     Comment: remotely quit alcohol use. Hx of heavy alcohol use.    Review of Systems  Constitutional: Negative for fever. Eyes: Negative for visual changes. ENT:  Negative for sore throat. Cardiovascular: Positive for chest pain. Respiratory: Positive for shortness of breath. Gastrointestinal: Negative for abdominal pain, vomiting and diarrhea. Genitourinary: Negative for dysuria. Musculoskeletal: Negative for back pain. Skin: Negative for rash. Neurological: Negative for headaches, focal weakness or numbness.   10-point ROS otherwise negative.  ____________________________________________   PHYSICAL EXAM:  VITAL SIGNS: ED Triage Vitals  Enc Vitals Group     BP 07/31/15 2053 196/109 mmHg     Pulse Rate 07/31/15 2053 73     Resp --      Temp 07/31/15 2053 98.1 F (36.7 C)     Temp Source 07/31/15 2053 Oral     SpO2 07/31/15 2053 97 %     Weight 07/31/15 2053 231 lb (104.781 kg)     Height 07/31/15 2053 5' 7"  (1.702 m)     Head Cir --      Peak Flow --      Pain Score 07/31/15 2053 9     Pain Loc --      Pain Edu? --      Excl. in [place]? --      Constitutional: Alert and oriented. Well appearing and in no distress. Eyes: Conjunctivae are normal. PERRL. Normal extraocular movements. ENT   Head: Normocephalic and atraumatic.   Nose: No congestion/rhinnorhea.   Mouth/Throat: Mucous membranes are moist.   Neck: No stridor. Cardiovascular: Normal rate, regular rhythm. Normal and symmetric distal pulses are present in all extremities. No murmurs, rubs, or gallops. Respiratory: Normal respiratory effort without tachypnea nor retractions. Breath sounds are clear and equal bilaterally. No wheezes/rales/rhonchi. Gastrointestinal: Soft and nontender. No distention. There is no CVA tenderness. Genitourinary: deferred Musculoskeletal: Nontender with normal range of motion in all extremities. No joint effusions.  No lower extremity tenderness nor edema. Neurologic:  Normal speech and language. No gross focal neurologic deficits are appreciated. Speech is normal.  Skin:  Skin is warm, dry and intact. No rash noted. Psychiatric: Mood  and affect are normal. Speech and behavior are normal. Patient exhibits appropriate insight and judgment.  ____________________________________________    LABS (pertinent positives/negatives)  Labs Reviewed  CBC - Abnormal; Notable for the following:    RDW 15.3 (*)    All other components within normal limits  BASIC METABOLIC PANEL  TROPONIN I  TROPONIN I       ____________________________________________   EKG  ED ECG REPORT I, BROWN, Dalton N, the attending physician, personally viewed and interpreted this ECG.   Date: 07/31/2015  EKG Time: 8:56 PM  Rate: 71  Rhythm: Normal. An Sinus rhythm  Axis: None  Intervals: Normal  ST&T Change: 1   ____________________________________________    RADIOLOGY     DG Chest 2 View (Final result) Result time: 07/31/15 21:19:54   Final result by Rad Results In Interface (07/31/15 21:19:54)   Narrative:   CLINICAL DATA: Chest pain today.  EXAM: CHEST 2 VIEW  COMPARISON: 07/11/2015  FINDINGS: The cardiac silhouette, mediastinal and hilar contours are within normal limits and stable. There are surgical changes related to bypass surgery. The lungs are clear. There are chronic bronchitic type interstitial changes. No pleural effusion or pulmonary lesions. The bony thorax is intact. Stable degenerative changes involving the thoracic  spine.  IMPRESSION: No acute cardiopulmonary findings. Chronic lung changes.   Electronically Signed By: Marijo Sanes M.D. On: 07/31/2015 21:19         INITIAL IMPRESSION / ASSESSMENT AND PLAN / ED COURSE  Pertinent labs & imaging results that were available during my care of the patient were reviewed by me and considered in my medical decision making (see chart for details).  Patient received multiple doses of IV morphine however continues to have 8 out of 10 chest pain as such patient will be admitted to the hospital. Patient discussed with Dr. Jannifer Franklin for hospital  admission for further cardiac evaluation  ____________________________________________   FINAL CLINICAL IMPRESSION(S) / ED DIAGNOSES  Final diagnoses:  Chest pain, unspecified chest pain type  Angina at rest      Gregor Hams, MD 08/01/15 [phone]

## 2015-07-31 NOTE — ED Notes (Signed)
Patient ambulatory to triage with steady gait, without difficulty or distress noted; pt reports 179/108 BP at home accomp by chest tightness; pt with hx of same

## 2015-08-01 ENCOUNTER — Encounter: Payer: Self-pay | Admitting: Internal Medicine

## 2015-08-01 DIAGNOSIS — I25119 Atherosclerotic heart disease of native coronary artery with unspecified angina pectoris: Secondary | ICD-10-CM | POA: Diagnosis not present

## 2015-08-01 DIAGNOSIS — R079 Chest pain, unspecified: Secondary | ICD-10-CM | POA: Diagnosis not present

## 2015-08-01 DIAGNOSIS — I5032 Chronic diastolic (congestive) heart failure: Secondary | ICD-10-CM | POA: Diagnosis present

## 2015-08-01 DIAGNOSIS — E1159 Type 2 diabetes mellitus with other circulatory complications: Secondary | ICD-10-CM

## 2015-08-01 DIAGNOSIS — I5023 Acute on chronic systolic (congestive) heart failure: Secondary | ICD-10-CM | POA: Diagnosis present

## 2015-08-01 DIAGNOSIS — I2510 Atherosclerotic heart disease of native coronary artery without angina pectoris: Secondary | ICD-10-CM

## 2015-08-01 DIAGNOSIS — N400 Enlarged prostate without lower urinary tract symptoms: Secondary | ICD-10-CM | POA: Diagnosis present

## 2015-08-01 DIAGNOSIS — J438 Other emphysema: Secondary | ICD-10-CM

## 2015-08-01 DIAGNOSIS — E785 Hyperlipidemia, unspecified: Secondary | ICD-10-CM

## 2015-08-01 DIAGNOSIS — I5022 Chronic systolic (congestive) heart failure: Secondary | ICD-10-CM | POA: Diagnosis present

## 2015-08-01 DIAGNOSIS — I208 Other forms of angina pectoris: Secondary | ICD-10-CM | POA: Diagnosis not present

## 2015-08-01 DIAGNOSIS — I10 Essential (primary) hypertension: Secondary | ICD-10-CM

## 2015-08-01 LAB — BASIC METABOLIC PANEL
Anion gap: 6 (ref 5–15)
BUN: 16 mg/dL (ref 6–20)
CO2: 33 mmol/L — ABNORMAL HIGH (ref 22–32)
Calcium: 9.2 mg/dL (ref 8.9–10.3)
Chloride: 104 mmol/L (ref 101–111)
Creatinine, Ser: 1.02 mg/dL (ref 0.61–1.24)
GFR calc Af Amer: >60 mL/min (ref >60)
GFR calc non Af Amer: >60 mL/min (ref >60)
Glucose, Bld: 107 mg/dL — ABNORMAL HIGH (ref 65–99)
Potassium: 3.8 mmol/L (ref 3.5–5.1)
Sodium: 143 mmol/L (ref 135–145)

## 2015-08-01 LAB — CBC
HCT: 45.1 % (ref 40.0–52.0)
Hemoglobin: 14.7 g/dL (ref 13.0–18.0)
MCH: 28.4 pg (ref 26.0–34.0)
MCHC: 32.5 g/dL (ref 32.0–36.0)
MCV: 87.4 fL (ref 80.0–100.0)
Platelets: 218 10*3/uL (ref 150–440)
RBC: 5.16 MIL/uL (ref 4.40–5.90)
RDW: 14.9 % — ABNORMAL HIGH (ref 11.5–14.5)
WBC: 5.3 10*3/uL (ref 3.8–10.6)

## 2015-08-01 LAB — TROPONIN I
Troponin I: <0.03 ng/mL (ref <0.031)
Troponin I: <0.03 ng/mL (ref <0.031)
Troponin I: <0.03 ng/mL (ref <0.031)
Troponin I: <0.03 ng/mL (ref <0.031)

## 2015-08-01 LAB — GLUCOSE, CAPILLARY
Glucose-Capillary: 100 mg/dL — ABNORMAL HIGH (ref 65–99)
Glucose-Capillary: 91 mg/dL (ref 65–99)
Glucose-Capillary: 126 mg/dL — ABNORMAL HIGH (ref 65–99)
Glucose-Capillary: 142 mg/dL — ABNORMAL HIGH (ref 65–99)

## 2015-08-01 MED ORDER — METFORMIN HCL 500 MG PO TABS
500.0000 mg | ORAL_TABLET | Freq: Every day | ORAL | Status: DC
  Administered 2015-08-01 – 2015-08-02 (×2): 500 mg via ORAL
  Filled 2015-08-01 (×2): qty 1

## 2015-08-01 MED ORDER — SODIUM CHLORIDE 0.9 % IV SOLN: INTRAVENOUS | Status: AC

## 2015-08-01 MED ORDER — INSULIN ASPART 100 UNIT/ML ~~LOC~~ SOLN: 0.0000 [IU] | Freq: Three times a day (TID) | SUBCUTANEOUS | Status: DC

## 2015-08-01 MED ORDER — MORPHINE SULFATE (PF) 4 MG/ML IV SOLN
INTRAVENOUS | Status: AC
  Administered 2015-08-01: 4 mg via INTRAVENOUS
  Filled 2015-08-01: qty 1

## 2015-08-01 MED ORDER — CLOPIDOGREL BISULFATE 75 MG PO TABS
75.0000 mg | ORAL_TABLET | Freq: Every day | ORAL | Status: DC
  Administered 2015-08-01 – 2015-08-02 (×2): 75 mg via ORAL
  Filled 2015-08-01 (×2): qty 1

## 2015-08-01 MED ORDER — HYDROCODONE-ACETAMINOPHEN 5-325 MG PO TABS
1.0000 | ORAL_TABLET | ORAL | Status: DC | PRN
  Administered 2015-08-01: 1 via ORAL
  Administered 2015-08-01 – 2015-08-02 (×2): 2 via ORAL
  Filled 2015-08-01 (×3): qty 2
  Filled 2015-08-01: qty 1

## 2015-08-01 MED ORDER — CARVEDILOL 3.125 MG PO TABS
3.1250 mg | ORAL_TABLET | Freq: Two times a day (BID) | ORAL | Status: DC
  Administered 2015-08-01 – 2015-08-02 (×3): 3.125 mg via ORAL
  Filled 2015-08-01 (×3): qty 1

## 2015-08-01 MED ORDER — FUROSEMIDE 10 MG/ML IJ SOLN
80.0000 mg | Freq: Two times a day (BID) | INTRAMUSCULAR | Status: DC
  Administered 2015-08-01: 80 mg via INTRAVENOUS
  Filled 2015-08-01: qty 8

## 2015-08-01 MED ORDER — OXYCODONE-ACETAMINOPHEN 5-325 MG PO TABS
1.0000 | ORAL_TABLET | Freq: Once | ORAL | Status: AC
Start: 2015-08-01 — End: 2015-08-01
  Administered 2015-08-01: 1 via ORAL

## 2015-08-01 MED ORDER — ENOXAPARIN SODIUM 40 MG/0.4ML ~~LOC~~ SOLN
40.0000 mg | SUBCUTANEOUS | Status: DC
  Administered 2015-08-01: 40 mg via SUBCUTANEOUS
  Filled 2015-08-01: qty 0.4

## 2015-08-01 MED ORDER — OXYCODONE-ACETAMINOPHEN 5-325 MG PO TABS
ORAL_TABLET | ORAL | Status: AC
  Administered 2015-08-01: 1 via ORAL
  Filled 2015-08-01: qty 1

## 2015-08-01 MED ORDER — ENOXAPARIN SODIUM 40 MG/0.4ML ~~LOC~~ SOLN: 40.0000 mg | Freq: Every day | SUBCUTANEOUS | Status: DC

## 2015-08-01 MED ORDER — MORPHINE SULFATE (PF) 4 MG/ML IV SOLN
4.0000 mg | Freq: Once | INTRAVENOUS | Status: AC
  Administered 2015-08-01: 4 mg via INTRAVENOUS

## 2015-08-01 MED ORDER — INSULIN ASPART 100 UNIT/ML ~~LOC~~ SOLN: 0.0000 [IU] | Freq: Four times a day (QID) | SUBCUTANEOUS | Status: DC

## 2015-08-01 MED ORDER — INFLUENZA VAC SPLIT QUAD 0.5 ML IM SUSY
0.5000 mL | PREFILLED_SYRINGE | INTRAMUSCULAR | Status: AC
  Administered 2015-08-02: 0.5 mL via INTRAMUSCULAR
  Filled 2015-08-01: qty 0.5

## 2015-08-01 MED ORDER — TAMSULOSIN HCL 0.4 MG PO CAPS
0.4000 mg | ORAL_CAPSULE | Freq: Every day | ORAL | Status: DC
  Administered 2015-08-01 – 2015-08-02 (×2): 0.4 mg via ORAL
  Filled 2015-08-01 (×2): qty 1

## 2015-08-01 MED ORDER — ATORVASTATIN CALCIUM 20 MG PO TABS
80.0000 mg | ORAL_TABLET | Freq: Every day | ORAL | Status: DC
  Administered 2015-08-01: 80 mg via ORAL
  Filled 2015-08-01: qty 4

## 2015-08-01 MED ORDER — RANOLAZINE ER 500 MG PO TB12
500.0000 mg | ORAL_TABLET | Freq: Two times a day (BID) | ORAL | Status: DC
  Administered 2015-08-01 – 2015-08-02 (×3): 500 mg via ORAL
  Filled 2015-08-01 (×3): qty 1

## 2015-08-01 MED ORDER — ALBUTEROL SULFATE (2.5 MG/3ML) 0.083% IN NEBU: 3.0000 mL | INHALATION_SOLUTION | Freq: Four times a day (QID) | RESPIRATORY_TRACT | Status: DC | PRN

## 2015-08-01 MED ORDER — PANTOPRAZOLE SODIUM 20 MG PO TBEC
20.0000 mg | DELAYED_RELEASE_TABLET | Freq: Every day | ORAL | Status: DC
  Filled 2015-08-01 (×2): qty 1

## 2015-08-01 MED ORDER — AMLODIPINE BESYLATE 10 MG PO TABS
10.0000 mg | ORAL_TABLET | Freq: Every day | ORAL | Status: DC
  Administered 2015-08-01 – 2015-08-02 (×2): 10 mg via ORAL
  Filled 2015-08-01 (×2): qty 1

## 2015-08-01 MED ORDER — HYDRALAZINE HCL 20 MG/ML IJ SOLN: 10.0000 mg | INTRAMUSCULAR | Status: DC | PRN

## 2015-08-01 MED ORDER — MORPHINE SULFATE (PF) 4 MG/ML IV SOLN
4.0000 mg | INTRAVENOUS | Status: DC | PRN
  Administered 2015-08-01: 4 mg via INTRAVENOUS
  Filled 2015-08-01: qty 1

## 2015-08-01 MED ORDER — LISINOPRIL 20 MG PO TABS
20.0000 mg | ORAL_TABLET | Freq: Every day | ORAL | Status: DC
  Administered 2015-08-01 – 2015-08-02 (×2): 20 mg via ORAL
  Filled 2015-08-01 (×2): qty 1

## 2015-08-01 MED ORDER — ONDANSETRON HCL 4 MG/2ML IJ SOLN
4.0000 mg | Freq: Four times a day (QID) | INTRAMUSCULAR | Status: DC | PRN
  Administered 2015-08-01 (×2): 4 mg via INTRAVENOUS
  Filled 2015-08-01 (×3): qty 2

## 2015-08-01 MED ORDER — ACETAMINOPHEN 650 MG RE SUPP
650.0000 mg | Freq: Four times a day (QID) | RECTAL | Status: DC | PRN
Start: 2015-08-01 — End: 2015-08-02

## 2015-08-01 MED ORDER — SODIUM CHLORIDE 0.9 % IJ SOLN
3.0000 mL | Freq: Two times a day (BID) | INTRAMUSCULAR | Status: DC
  Administered 2015-08-01 (×2): 3 mL via INTRAVENOUS

## 2015-08-01 MED ORDER — ISOSORBIDE MONONITRATE ER 60 MG PO TB24
120.0000 mg | ORAL_TABLET | Freq: Every day | ORAL | Status: DC
  Administered 2015-08-01 – 2015-08-02 (×2): 120 mg via ORAL
  Filled 2015-08-01 (×2): qty 2

## 2015-08-01 MED ORDER — ACETAMINOPHEN 325 MG PO TABS: 650.0000 mg | ORAL_TABLET | Freq: Four times a day (QID) | ORAL | Status: DC | PRN

## 2015-08-01 MED ORDER — LABETALOL HCL 5 MG/ML IV SOLN: 10.0000 mg | INTRAVENOUS | Status: DC | PRN

## 2015-08-01 MED ORDER — ONDANSETRON HCL 4 MG PO TABS
4.0000 mg | ORAL_TABLET | Freq: Four times a day (QID) | ORAL | Status: DC | PRN
  Administered 2015-08-01: 4 mg via ORAL
  Filled 2015-08-01: qty 1

## 2015-08-01 MED ORDER — INSULIN ASPART 100 UNIT/ML ~~LOC~~ SOLN: 0.0000 [IU] | Freq: Every day | SUBCUTANEOUS | Status: DC

## 2015-08-01 MED ORDER — PANTOPRAZOLE SODIUM 40 MG PO TBEC
40.0000 mg | DELAYED_RELEASE_TABLET | Freq: Every day | ORAL | Status: DC
Start: 2015-08-01 — End: 2015-08-02
  Administered 2015-08-01 – 2015-08-02 (×2): 40 mg via ORAL
  Filled 2015-08-01 (×2): qty 1

## 2015-08-01 MED ORDER — TIOTROPIUM BROMIDE MONOHYDRATE 18 MCG IN CAPS
18.0000 ug | ORAL_CAPSULE | Freq: Every day | RESPIRATORY_TRACT | Status: DC
  Administered 2015-08-01 – 2015-08-02 (×2): 18 ug via RESPIRATORY_TRACT
  Filled 2015-08-01: qty 5

## 2015-08-01 MED ORDER — ALPRAZOLAM 0.5 MG PO TABS: 0.5000 mg | ORAL_TABLET | Freq: Three times a day (TID) | ORAL | Status: DC | PRN

## 2015-08-01 MED ORDER — FUROSEMIDE 10 MG/ML IJ SOLN
80.0000 mg | Freq: Once | INTRAMUSCULAR | Status: AC
  Administered 2015-08-01: 80 mg via INTRAVENOUS
  Filled 2015-08-01: qty 8

## 2015-08-01 MED ORDER — PNEUMOCOCCAL VAC POLYVALENT 25 MCG/0.5ML IJ INJ: 0.5000 mL | INJECTION | INTRAMUSCULAR | Status: DC

## 2015-08-01 MED ORDER — ASPIRIN EC 81 MG PO TBEC
81.0000 mg | DELAYED_RELEASE_TABLET | Freq: Every morning | ORAL | Status: DC
  Administered 2015-08-01 – 2015-08-02 (×2): 81 mg via ORAL
  Filled 2015-08-01 (×2): qty 1

## 2015-08-01 MED ORDER — FUROSEMIDE 40 MG PO TABS
40.0000 mg | ORAL_TABLET | Freq: Every morning | ORAL | Status: DC
  Filled 2015-08-01: qty 1

## 2015-08-01 NOTE — Progress Notes (Signed)
Patient ID: Lawrence [name], male   DOB: 03-25-1956, 59 y.o.   MRN: [id number] [facility] PROGRESS NOTE  PCP: [name], MD  HPI/Subjective: Patient has chest pain. He states that is chronic and the chest severe in intensity sent to the chest. He's had numerous cardiac catheterizations in the past. The last time he had an angioplasty and is now wondering if he needs a stent. States his legs are more swollen than usual.  Objective: Filed Vitals:   08/01/15 1105  BP: 189/90  Pulse: 53  Temp: 97.5 F (36.4 C)  Resp: 18    Filed Weights   07/31/15 2053 08/01/15 0528  Weight: 104.781 kg (231 lb) 100.653 kg (221 lb 14.4 oz)    ROS: Review of Systems  Constitutional: Negative for fever and chills.  Eyes: Negative for blurred vision.  Respiratory: Positive for shortness of breath. Negative for cough.   Cardiovascular: Positive for chest pain.  Gastrointestinal: Negative for nausea, vomiting, abdominal pain, diarrhea and constipation.  Genitourinary: Negative for dysuria.  Musculoskeletal: Negative for joint pain.  Neurological: Negative for dizziness and headaches.   Exam: Physical Exam  Constitutional: He is oriented to person, place, and time.  HENT:  Nose: No mucosal edema.  Mouth/Throat: No oropharyngeal exudate or posterior oropharyngeal edema.  Eyes: Conjunctivae, EOM and lids are normal. Pupils are equal, round, and reactive to light.  Neck: No JVD present. Carotid bruit is not present. No edema present. No thyroid mass and no thyromegaly present.  Cardiovascular: S1 normal and S2 normal.  Exam reveals no gallop.   No murmur heard. Pulses:      Dorsalis pedis pulses are 2+ on the right side, and 2+ on the left side.  Some pain to palpation over the chest wall.  Respiratory: No respiratory distress. He has no wheezes. He has no rhonchi. He has no rales.  GI: Soft. Bowel sounds are normal. There is no tenderness.  Musculoskeletal:       Right ankle: He  exhibits swelling.       Left ankle: He exhibits swelling.  Lymphadenopathy:    He has no cervical adenopathy.  Neurological: He is alert and oriented to person, place, and time. No cranial nerve deficit.  Skin: Skin is warm. No rash noted. Nails show no clubbing.  Scaling seen on the face.  Psychiatric: He has a normal mood and affect.    Data Reviewed: Basic Metabolic Panel:  Recent Labs Lab 07/31/15 2057 08/01/15 0712  NA 140 143  K 3.7 3.8  CL 105 104  CO2 28 33*  GLUCOSE 92 107*  BUN 16 16  CREATININE 1.07 1.02  CALCIUM 9.1 9.2   CBC:  Recent Labs Lab 07/31/15 2057 08/01/15 0712  WBC 6.6 5.3  HGB 14.6 14.7  HCT 44.2 45.1  MCV 87.5 87.4  PLT 234 218   Cardiac Enzymes:  Recent Labs Lab 07/31/15 2057 08/01/15 0123 08/01/15 0712 08/01/15 1251  TROPONINI <0.03 <0.03 <0.03 <0.03   BNP (last 3 results)  Recent Labs  06/25/15 2215  BNP 68.0   CBG:  Recent Labs Lab 08/01/15 0524 08/01/15 1105  GLUCAP 100* 91      Studies: Dg Chest 2 View  07/31/2015   CLINICAL DATA:  Chest pain today.  EXAM: CHEST  2 VIEW  COMPARISON:  07/11/2015  FINDINGS: The cardiac silhouette, mediastinal and hilar contours are within normal limits and stable. There are surgical changes related to bypass surgery. The lungs are clear. There  are chronic bronchitic type interstitial changes. No pleural effusion or pulmonary lesions. The bony thorax is intact. Stable degenerative changes involving the thoracic spine.  IMPRESSION: No acute cardiopulmonary findings.  Chronic lung changes.   Electronically Signed   By: Marijo Sanes M.D.   On: 07/31/2015 21:19    Scheduled Meds: . amLODipine  10 mg Oral Daily  . aspirin EC  81 mg Oral q morning - 10a  . atorvastatin  80 mg Oral QHS  . carvedilol  3.125 mg Oral BID WC  . clopidogrel  75 mg Oral Daily  . enoxaparin (LOVENOX) injection  40 mg Subcutaneous Q24H  . furosemide  80 mg Intravenous BID  . insulin aspart  0-9 Units  Subcutaneous Q6H  . isosorbide mononitrate  120 mg Oral Daily  . lisinopril  20 mg Oral Daily  . metFORMIN  500 mg Oral Q breakfast  . pantoprazole  40 mg Oral QAC breakfast  . ranolazine  500 mg Oral BID  . sodium chloride  3 mL Intravenous Q12H  . tamsulosin  0.4 mg Oral Daily  . tiotropium  18 mcg Inhalation Daily    Assessment/Plan:  1. Chest pain, history of coronary artery disease. I will let cardiology decide on what type of management they want to proceed with. Patient is on aspirin, Plavix, Coreg and atorvastatin. His indoor was recently increased to 120 mg. Cardiology just started Ranexa. This could also be chest wall pain also. 2. Accelerated hypertension patient was given an IV dose of Lasix today. Lisinopril increased. 3. Edema of the lower extremity this could be secondary to Norvasc but we will continue Lasix at this point. 4. Type 2 diabetes- continue metformin and sliding scale for now 5. Hyperlipidemia unspecified on atorvastatin 6. Gastroesophageal reflux disease without esophagitis on Protonix 7. BPH on Flomax.  Code Status:     Code Status Orders        Start     Ordered   08/01/15 0522  Full code   Continuous     08/01/15 0521     Disposition Plan: Likely home tomorrow  Consultants:  Cardiology  Time spent: 35 minutes  Loletha Grayer  [facility]

## 2015-08-01 NOTE — Progress Notes (Signed)
Pt arrived to unit via stretcher, A&O, pt skin is warm to touch. Multiple discoloration noted in bilateral arms and back. Face skin is red and dry. Ecchymosis to bilateral arms and legs. There is redness and moisture in bilateral groins.  Witnessed by Corky Sing, RN.   Contacted Dr. Reece Levy to question about order of NS infusion 33m/hr, Pt has history of CHF, Per Dr. RReece Levyto refer this concern with the morning doctors.

## 2015-08-01 NOTE — H&P (Signed)
Lawrence [name] at [place] NAME: Lawrence [name]    MR#:  [id number]  DATE OF BIRTH:  03-05-1956  DATE OF ADMISSION:  07/31/2015  PRIMARY CARE PHYSICIAN: [name], MD   REQUESTING/REFERRING PHYSICIAN: Owens Shark, MD  CHIEF COMPLAINT:   Chief Complaint  Patient presents with  . Hypertension  . Chest Pain    HISTORY OF PRESENT ILLNESS:  Lawrence [name]  is a 59 y.o. male who presents with angina at rest. Patient has long-standing history of CAD, with multiple stents and multiple catheterizations in the past. Patient states that he is seen primarily by his cardiologist at [facility], but has had recent catheterization at [facility] as well. Patient reports that he was told by cardiologist who did a catheterization about 5-6 months ago that the angioplasty done at that time would likely last him no more than one year and he would need repeat catheterization for stent or to that time most likely. Patient states that his angina has been getting progressively worse. He states that he came in the ED this time because he also started noticing bilateral lower extremity swelling, and felt like that might be a sign of the beginnings of heart failure. His episodes of chest pain or associated with nausea, diaphoresis, lightheadedness. He states that symptoms get worse with activity and improves with rest, but that recently they have started occurring at rest as well. His blood pressure is also notably been uncontrolled recently since he had medication doses changed. Specifically his Coreg dose was drastically reduced. Initial workup here in the ED is largely negative, including negative cardiac enzymes. However, given this patient's known history of significant disease, hospitalists were called for admission.  PAST MEDICAL HISTORY:   Past Medical History  Diagnosis Date  . Hypertension   . Coronary artery disease   . COPD (chronic obstructive pulmonary  disease)   . Chronic bronchitis   . Emphysema lung   . Diabetes mellitus without complication   . Asthma   . Celiac disease   . CHF (congestive heart failure)   . Anxiety     PAST SURGICAL HISTORY:   Past Surgical History  Procedure Laterality Date  . Vascular surgery    . Bypass graft    . Coronary angioplasty with stent placement  2014    DES CFX  . Tonsillectomy    . Cholecystectomy    . Esophageal dilation    . Coronary angioplasty  03/2015    POBA LAD at [facility]  . Cardiac catheterization  05/2015    at [facility] [place], LAD diffuse dz, D1 40%, D2 [place], D3 40%, CFX [place], OM1 OK, OM2 OK, RCA [place], LIMA-LAD atretic, SVG-OM 50%  . Cardiac catheterization N/A 07/12/2015    Procedure: Left Heart Cath and Cors/Grafts Angiography;  Surgeon: Belva Crome, MD;  Location: [place];  Service: Cardiovascular;  Laterality: N/A;    SOCIAL HISTORY:   Social History  Substance Use Topics  . Smoking status: Former Smoker    Quit date: 04/22/2013  . Smokeless tobacco: Never Used  . Alcohol Use: No     Comment: remotely quit alcohol use. Hx of heavy alcohol use.    FAMILY HISTORY:   Family History  Problem Relation Age of Onset  . Heart attack Mother   . Cirrhosis Mother   . Diabetes Mother   . Depression Mother   . Heart disease Mother   . Heart attack Father   .  Diabetes Father   . Depression Father   . Heart disease Father   . Parkinson's disease Brother     DRUG ALLERGIES:   Allergies  Allergen Reactions  . Albuterol Sulfate [Albuterol] Other (See Comments)    Speeds heart rate up, but Xopenex wasn't effective so MD changed to Albuterol even though it causes palpatations  . Demerol [Meperidine] Hives  . Sulfa Antibiotics Hives  . Morphine Sulfate Rash    Nausea and vomiting    MEDICATIONS AT HOME:   Prior to Admission medications   Medication Sig Start Date End Date Taking? Authorizing Provider  albuterol (PROVENTIL HFA;VENTOLIN HFA) 108 (90 BASE)  MCG/ACT inhaler Inhale 2 puffs into the lungs every 6 (six) hours as needed for wheezing. 07/03/15  Yes Birdie Sons, MD  ALPRAZolam Duanne Moron) 1 MG tablet TAKE 1/2-1 TABLET BY MOUTH 3 TIMES A DAY 05/22/15  Yes Birdie Sons, MD  aspirin EC 81 MG tablet Take 81 mg by mouth every morning.   Yes Historical Provider, MD  atorvastatin (LIPITOR) 80 MG tablet Take 1 tablet (80 mg total) by mouth at bedtime. 04/25/15  Yes Birdie Sons, MD  carvedilol (COREG) 3.125 MG tablet Take 3.125 mg by mouth daily.   Yes Historical Provider, MD  cetirizine (ZYRTEC) 10 MG tablet Take 10 mg by mouth at bedtime.    Yes Historical Provider, MD  clopidogrel (PLAVIX) 75 MG tablet TAKE ONE (1) TABLET EACH DAY 04/22/15  Yes Birdie Sons, MD  furosemide (LASIX) 40 MG tablet Take 40 mg by mouth every morning.   Yes Historical Provider, MD  hyoscyamine (LEVSIN SL) 0.125 MG SL tablet Place 0.125 mg under the tongue every 6 (six) hours as needed for cramping.    Yes Historical Provider, MD  isosorbide mononitrate (IMDUR) 60 MG 24 hr tablet Take 120 mg by mouth daily.  06/12/15  Yes Historical Provider, MD  lansoprazole (PREVACID) 30 MG capsule Take 30 mg by mouth every morning.  06/17/15  Yes Historical Provider, MD  lisinopril (PRINIVIL,ZESTRIL) 20 MG tablet Take 20 mg by mouth daily.   Yes Historical Provider, MD  metFORMIN (GLUCOPHAGE) 500 MG tablet Take 1 tablet (500 mg total) by mouth every morning. 04/25/15  Yes Birdie Sons, MD  nitroGLYCERIN (NITROSTAT) 0.4 MG SL tablet Place 1 tablet (0.4 mg total) under the tongue every 5 (five) minutes as needed for chest pain. Every 5 minutes as needed for chest pain 04/26/15  Yes Birdie Sons, MD  omega-3 acid ethyl esters (LOVAZA) 1 G capsule TAKE FOUR CAPSULES BY MOUTH DAILY 07/01/15  Yes Birdie Sons, MD  oxyCODONE-acetaminophen (PERCOCET) 10-325 MG per tablet Take 1 tablet by mouth every 4 (four) hours as needed for pain. 07/03/15  Yes Birdie Sons, MD  potassium chloride  (K-DUR,KLOR-CON) 10 MEQ tablet Take 10 mEq by mouth every morning.   Yes Historical Provider, MD  tamsulosin (FLOMAX) 0.4 MG CAPS capsule Take 0.4 mg by mouth daily.   Yes Historical Provider, MD  tiotropium (SPIRIVA) 18 MCG inhalation capsule Place 18 mcg into inhaler and inhale daily.   Yes Historical Provider, MD  ADVAIR DISKUS 500-50 MCG/DOSE AEPB ONE PUFF EVERY 12 HOURS AS DIRECTED. RINSE MOUTH AFTER USING Patient not taking: Reported on 07/31/2015 07/04/15   Birdie Sons, MD  magnesium citrate SOLN Take 296 mLs (1 Bottle total) by mouth once. Patient not taking: Reported on 07/01/2015 05/08/15   Joanne Gavel, MD  meloxicam (MOBIC) 7.5 MG tablet  Take 1 tablet (7.5 mg total) by mouth daily. Patient not taking: Reported on 07/01/2015 06/03/15 06/02/16  Marylene Land, NP  polyethylene glycol [facility] / GLYCOLAX) packet Take 17 g by mouth daily. Patient not taking: Reported on 07/01/2015 05/05/15   Earleen Newport, MD    REVIEW OF SYSTEMS:  Review of Systems  Constitutional: Negative for fever, chills, weight loss and malaise/fatigue.  HENT: Negative for ear pain, hearing loss and tinnitus.   Eyes: Negative for blurred vision, double vision, pain and redness.  Respiratory: Positive for shortness of breath. Negative for cough and hemoptysis.   Cardiovascular: Positive for chest pain and leg swelling. Negative for palpitations and orthopnea.  Gastrointestinal: Positive for nausea. Negative for vomiting, abdominal pain, diarrhea and constipation.  Genitourinary: Negative for dysuria, frequency and hematuria.  Musculoskeletal: Negative for back pain, joint pain and neck pain.  Skin:       No acne, rash, or lesions  Neurological: Negative for dizziness, tremors, focal weakness and weakness.  Endo/Heme/Allergies: Negative for polydipsia. Does not bruise/bleed easily.  Psychiatric/Behavioral: Negative for depression. The patient is not nervous/anxious and does not have insomnia.      VITAL  SIGNS:   Filed Vitals:   08/01/15 0130 08/01/15 0200 08/01/15 0230 08/01/15 0300  BP: 147/93 144/95 154/98 175/108  Pulse: 65 69 63 58  Temp:      TempSrc:      Resp: 12 9 17 11   Height:      Weight:      SpO2: 97% 98% 94% 97%   Wt Readings from Last 3 Encounters:  07/31/15 104.781 kg (231 lb)  07/12/15 99.61 kg (219 lb 9.6 oz)  07/03/15 101.152 kg (223 lb)    PHYSICAL EXAMINATION:  Physical Exam  Vitals reviewed. Constitutional: He is oriented to person, place, and time. He appears well-developed and well-nourished. No distress.  HENT:  Head: Normocephalic and atraumatic.  Mouth/Throat: Oropharynx is clear and moist.  Eyes: Conjunctivae and EOM are normal. Pupils are equal, round, and reactive to light. No scleral icterus.  Neck: Normal range of motion. Neck supple. No JVD present. No thyromegaly present.  Cardiovascular: Normal rate, regular rhythm and intact distal pulses.  Exam reveals no gallop and no friction rub.   No murmur heard. Respiratory: Effort normal and breath sounds normal. No respiratory distress. He has no wheezes. He has no rales.  GI: Soft. Bowel sounds are normal. He exhibits no distension. There is no tenderness.  Musculoskeletal: Normal range of motion. He exhibits no edema.  No arthritis, no gout  Lymphadenopathy:    He has no cervical adenopathy.  Neurological: He is alert and oriented to person, place, and time. No cranial nerve deficit.  No dysarthria, no aphasia  Skin: Skin is warm and dry. No rash noted. No erythema.  Psychiatric: He has a normal mood and affect. His behavior is normal. Judgment and thought content normal.    LABORATORY PANEL:   CBC  Recent Labs Lab 07/31/15 2057  WBC 6.6  HGB 14.6  HCT 44.2  PLT 234   ------------------------------------------------------------------------------------------------------------------  Chemistries   Recent Labs Lab 07/31/15 2057  NA 140  K 3.7  CL 105  CO2 28  GLUCOSE 92   BUN 16  CREATININE 1.07  CALCIUM 9.1   ------------------------------------------------------------------------------------------------------------------  Cardiac Enzymes  Recent Labs Lab 08/01/15 0123  TROPONINI <0.03   ------------------------------------------------------------------------------------------------------------------  RADIOLOGY:  Dg Chest 2 View  07/31/2015   CLINICAL DATA:  Chest pain today.  EXAM: CHEST  2 VIEW  COMPARISON:  07/11/2015  FINDINGS: The cardiac silhouette, mediastinal and hilar contours are within normal limits and stable. There are surgical changes related to bypass surgery. The lungs are clear. There are chronic bronchitic type interstitial changes. No pleural effusion or pulmonary lesions. The bony thorax is intact. Stable degenerative changes involving the thoracic spine.  IMPRESSION: No acute cardiopulmonary findings.  Chronic lung changes.   Electronically Signed   By: Marijo Sanes M.D.   On: 07/31/2015 21:19    EKG:   Orders placed or performed during the hospital encounter of 07/31/15  . ED EKG within 10 minutes  . ED EKG within 10 minutes  . EKG 12-Lead  . EKG 12-Lead    IMPRESSION AND PLAN:  Principal Problem:   Angina at rest - trend cardiac enzymes, get cardiology consult for further evaluation to determine need for catheterization. We'll defer to cardiology to decide if they wished order echocardiogram, and other testing. Active Problems:   CAD (coronary artery disease) - continue home meds for this, including aspirin and Plavix, nitrate, and other appropriate medications.   HTN (hypertension) - we'll continue home antihypertensives, will add IV when necessary antihypertensives labetalol and hydralazine to be used based on blood pressure and heart rate parameters, goal blood pressures less than 160/100.   DM (diabetes mellitus) - continue home metformin, will also use sliding scale insulin appropriate fingerstick glucose checks every  6 hours for now while nothing by mouth, and would need to be changed to before meals at bedtime once patient is eating a carb modified diet   GERD (gastroesophageal reflux disease) - : Home dose PPI   HLD (hyperlipidemia) - home dose statin   COPD (chronic obstructive pulmonary disease) - home inhalers   BPH (benign prostatic hyperplasia) - home dose Flomax  All the records are reviewed and case discussed with ED provider. Management plans discussed with the patient and/or family.  DVT PROPHYLAXIS: SubQ lovenox  ADMISSION STATUS: Inpatient  CODE STATUS: Full  TOTAL TIME TAKING CARE OF THIS PATIENT: 45 minutes.    Keyarah Mcroy FIELDING 08/01/2015, 3:27 AM  [facility]  Office  [phone]  CC: Primary care physician; [name], MD

## 2015-08-01 NOTE — ED Notes (Signed)
Green top tube collected from previously established PIV - sample sent for MD ordered repeat troponin.

## 2015-08-01 NOTE — Progress Notes (Signed)
Patient was made an initial appointment at the outpatient [facility] on August 12, 2015 at 11:00am. Thank you

## 2015-08-01 NOTE — Discharge Instructions (Signed)
[facility] appointment on August 12, 2015 at 11:00am with Lawrence [name], [place]. Please call [phone] to reschedule.    Chest Pain (Nonspecific) It is often hard to give a specific diagnosis for the cause of chest pain. There is always a chance that your pain could be related to something serious, such as a heart attack or a blood clot in the lungs. You need to follow up with your health care provider for further evaluation. CAUSES   Heartburn.  Pneumonia or bronchitis.  Anxiety or stress.  Inflammation around your heart (pericarditis) or lung (pleuritis or pleurisy).  A blood clot in the lung.  A collapsed lung (pneumothorax). It can develop suddenly on its own (spontaneous pneumothorax) or from trauma to the chest.  Shingles infection (herpes zoster virus). The chest wall is composed of bones, muscles, and cartilage. Any of these can be the source of the pain.  The bones can be bruised by injury.  The muscles or cartilage can be strained by coughing or overwork.  The cartilage can be affected by inflammation and become sore (costochondritis). DIAGNOSIS  Lab tests or other studies may be needed to find the cause of your pain. Your health care provider may have you take a test called an ambulatory electrocardiogram (ECG). An ECG records your heartbeat patterns over a 24-hour period. You may also have other tests, such as:  Transthoracic echocardiogram (TTE). During echocardiography, sound waves are used to evaluate how blood flows through your heart.  Transesophageal echocardiogram (TEE).  Cardiac monitoring. This allows your health care provider to monitor your heart rate and rhythm in real time.  Holter monitor. This is a portable device that records your heartbeat and can help diagnose heart arrhythmias. It allows your health care provider to track your heart activity for several days, if needed.  Stress tests by exercise or by giving medicine that makes the  heart beat faster. TREATMENT   Treatment depends on what may be causing your chest pain. Treatment may include:  Acid blockers for heartburn.  Anti-inflammatory medicine.  Pain medicine for inflammatory conditions.  Antibiotics if an infection is present.  You may be advised to change lifestyle habits. This includes stopping smoking and avoiding alcohol, caffeine, and chocolate.  You may be advised to keep your head raised (elevated) when sleeping. This reduces the chance of acid going backward from your stomach into your esophagus. Most of the time, nonspecific chest pain will improve within 2-3 days with rest and mild pain medicine.  HOME CARE INSTRUCTIONS   If antibiotics were prescribed, take them as directed. Finish them even if you start to feel better.  For the next few days, avoid physical activities that bring on chest pain. Continue physical activities as directed.  Do not use any tobacco products, including cigarettes, chewing tobacco, or electronic cigarettes.  Avoid drinking alcohol.  Only take medicine as directed by your health care provider.  Follow your health care provider's suggestions for further testing if your chest pain does not go away.  Keep any follow-up appointments you made. If you do not go to an appointment, you could develop lasting (chronic) problems with pain. If there is any problem keeping an appointment, call to reschedule. SEEK MEDICAL CARE IF:   Your chest pain does not go away, even after treatment.  You have a rash with blisters on your chest.  You have a fever. SEEK IMMEDIATE MEDICAL CARE IF:   You have increased chest pain or pain that spreads to  your arm, neck, jaw, back, or abdomen.  You have shortness of breath.  You have an increasing cough, or you cough up blood.  You have severe back or abdominal pain.  You feel nauseous or vomit.  You have severe weakness.  You faint.  You have chills. This is an emergency. Do  not wait to see if the pain will go away. Get medical help at once. Call your local emergency services (911 in [place] Do not drive yourself to the hospital. MAKE SURE YOU:   Understand these instructions.  Will watch your condition.  Will get help right away if you are not doing well or get worse. Document Released: 08/12/2005 Document Revised: 11/07/2013 Document Reviewed: 06/07/2008 [facility] Patient Information 2015 [place], [place]. This information is not intended to replace advice given to you by your health care provider. Make sure you discuss any questions you have with your health care provider.

## 2015-08-01 NOTE — Progress Notes (Signed)
Pt complaining of nausea, Zofran 65m iv given, will monitor pt.

## 2015-08-01 NOTE — ED Notes (Signed)
Pt urinal emptied.

## 2015-08-01 NOTE — Consult Note (Addendum)
CARDIOLOGY CONSULT NOTE   Patient ID: Lawrence [name] MRN: [id number], DOB/AGE: Dec 20, 1955   Admit date: 07/31/2015 Date of Consult: 08/01/2015   Primary Physician: [name], MD Primary Cardiologist: Dr. Gennette Pac  Pt. Profile  59 y/o male with a h/o CAD s/p CABG x 2 in 2002 with multiple interventions and caths since then, who was admitted early this AM 2/2 recurrent c/p.  Problem List  Past Medical History  Diagnosis Date  . CAD (coronary artery disease)     a. 2002 CABGx2 (LIMA->LAD, VG->VG->OM1);  b. 09/2012 DES->OM;  c. 03/2015 PTCA of LAD [facility]) in setting of atretic LIMA; d. 05/2015 Cath [facility]): nonobs dzs; e. 06/2015 Cath ([facility]): LM nl, LAD 45p/d ISR, 50d, D1/2 small, LCX 50p/d ISR, OM1 70ost, 30 ISR, VG->OM1 50ost, 18m LIMA->LAD 99p/d - atretic, RCA dominant, nl.  . Essential hypertension   . COPD (chronic obstructive pulmonary disease)     a. Chronic bronchitis and emphysema.  . Type II diabetes mellitus   . Asthma   . Celiac disease   . Chronic diastolic CHF (congestive heart failure)     a. 06/2009 Echo: EF 60-65%, Gr 1 DD, triv AI, mildly dil LA, nl RV.  .Marland KitchenAnxiety   . History of tobacco abuse     a. Quit 2014.  .Marland KitchenPSVT (paroxysmal supraventricular tachycardia)     a. 10/2012 Noted on Zio Patch.    Past Surgical History  Procedure Laterality Date  . Vascular surgery    . Bypass graft    . Tonsillectomy    . Cholecystectomy    . Cardiac catheterization N/A 07/12/2015    rocedure: Left Heart Cath and Cors/Grafts Angiography;  Surgeon: HBelva Crome MD;  [facility];  Service: Cardiovascular;  Laterality: N/A;  . Esophageal dilation       Allergies  Allergies  Allergen Reactions  . Albuterol Sulfate [Albuterol] Other (See Comments)    Speeds heart rate up, but Xopenex wasn't effective so MD changed to Albuterol even though it causes palpatations  . Demerol [Meperidine] Hives  . Sulfa Antibiotics Hives  . Morphine Sulfate Rash    Nausea  and vomiting    HPI   59y/o male with the above complex PMH.  He has a h/o CAD and has been followed closely @ [facility].  He is s/p CABG x 2 following anterior STEMI and LAD dissection in 2002.  He has subsequently undergone multiple caths and DES to the OM in 09/2012, and PTCA of the LAD at the site of the LIMA anastamosis in 03/2015 (in the setting of an atretic LIMA).  He underwent relook caths in the setting of recurrent and somewhat constant c/p in 05/2015 ([facility]- nonobs dzs) and 06/2015 ([facility] - nonobs dzs) and has been medically managed since.  [facility] was recommended.  [facility] same left lower sternal chest pain and apparently seen by cardiology who felt that pain was non-cardiac.  CT of the abd showed no PE, a healing left 4th rib fx, emphysema, and asymmetric thickening of the distal esophagus and he was subsequently discharged the following day by internal medicine.  Since d/c, he has continued to have relatively constant left sided chest discomfort.  This extends from the left lower sternal border superior-laterally toward the left nipple and is fairly focal in nature.  He says that it is  sometimes worse with palpation.  He has also had some worsening of his baseline level of DOE.  His BP's have been running higher at home and he has noted some increase in abd girth and lower ext edema.  Yesterday, due to the above mentioned symptoms along with high bp's on his home cuff, he presented [facility] ED.  There, ECG was non-acute.  Trop was nl.  He was felt to have mild volume overload and was treated with 40 of IV lasix and subsequently admitted.  He has r/o for MI despite constant left chest pain since admission.  He seems to think that placing a stent @ the LIMA-LAD anastomosis, the are prev treated with PTCA @ [facility] in May, would help him.  We discussed how flow down that area was felt to be good on caths in both July  and August.  Further, the interventional team chose not to place a stent (per cath report) due to acceptable balloon result and b/c the lesion was ath the site of the LIMA anastamosis (2.5 mm balloon was used).  Inpatient Medications  . aspirin EC  81 mg Oral q morning - 10a  . atorvastatin  80 mg Oral QHS  . carvedilol  3.125 mg Oral BID WC  . clopidogrel  75 mg Oral Daily  . enoxaparin (LOVENOX) injection  40 mg Subcutaneous Q24H  . furosemide  80 mg Intravenous Once  . furosemide  40 mg Oral q morning - 10a  . insulin aspart  0-9 Units Subcutaneous Q6H  . isosorbide mononitrate  120 mg Oral Daily  . lisinopril  20 mg Oral Daily  . metFORMIN  500 mg Oral Q breakfast  . pantoprazole  40 mg Oral QAC breakfast  . ranolazine  500 mg Oral BID  . sodium chloride  3 mL Intravenous Q12H  . tamsulosin  0.4 mg Oral Daily  . tiotropium  18 mcg Inhalation Daily    Family History Family History  Problem Relation Age of Onset  . Heart attack Mother   . Cirrhosis Mother   . Diabetes Mother   . Depression Mother   . Heart disease Mother   . Heart attack Father   . Diabetes Father   . Depression Father   . Heart disease Father   . Parkinson's disease Brother      Social History Social History   Social History  . Marital Status: Married    Spouse Name: N/A  . Number of Children: N/A  . Years of Education: N/A   Occupational History  . Disabled    Social History Main Topics  . Smoking status: Former Smoker    Quit date: 04/22/2013  . Smokeless tobacco: Never Used  . Alcohol Use: No     Comment: remotely quit alcohol use. Hx of heavy alcohol use.  . Drug Use: No  . Sexual Activity: Not on file   Other Topics Concern  . Not on file   Social History Narrative   Pt lives in [place] with wife.  Does not routinely exercise.     Review of Systems  General:  No chills, fever, night sweats.  +++ weight gain.  Cardiovascular:  +++ chest pain, +++ dyspnea on exertion, +++  LE edema, no orthopnea, palpitations, paroxysmal nocturnal dyspnea. Dermatological: No rash, lesions/masses Respiratory: No cough, +++ dyspnea Urologic: No hematuria, dysuria Abdominal:   No nausea, vomiting, diarrhea, bright red blood per rectum, melena, or hematemesis Neurologic:  No visual changes, wkns, changes  in mental status. All other systems reviewed and are otherwise negative except as noted above.  Physical Exam  Blood pressure 172/86, pulse 48, temperature 97.5 F (36.4 C), temperature source Oral, resp. rate 17, height 5' 7"  (1.702 m), weight 221 lb 14.4 oz (100.653 kg), SpO2 95 %.  General: Pleasant, NAD Psych: Normal affect. Neuro: Alert and oriented X 3. Moves all extremities spontaneously. HEENT: Normal  Neck: Supple without bruits.  JVP ~ 12 cm. Lungs:  Resp regular and unlabored, bibasilar crackles with basilar exp wheezing. Heart: RRR, distant, no s3, s4, or murmurs. Abdomen: Semi-firm, protuberant, non-tender, BS + x 4.  Extremities: No clubbing, cyanosis.  1+ bilat LE edema. DP/PT/Radials 2+ and equal bilaterally.  Labs   Recent Labs  07/31/15 2057 08/01/15 0123 08/01/15 0712  TROPONINI <0.03 <0.03 <0.03   Lab Results  Component Value Date   WBC 5.3 08/01/2015   HGB 14.7 08/01/2015   HCT 45.1 08/01/2015   MCV 87.4 08/01/2015   PLT 218 08/01/2015     Recent Labs Lab 08/01/15 0712  NA 143  K 3.8  CL 104  CO2 33*  BUN 16  CREATININE 1.02  CALCIUM 9.2  GLUCOSE 107*   Lab Results  Component Value Date   CHOL 136 07/12/2015   HDL 35* 07/12/2015   LDLCALC 78 07/12/2015   TRIG 113 07/12/2015   Radiology/Studies  Dg Chest 2 View  07/31/2015   CLINICAL DATA:  Chest pain today.  EXAM: CHEST  2 VIEW  COMPARISON:  07/11/2015  FINDINGS: The cardiac silhouette, mediastinal and hilar contours are within normal limits and stable. There are surgical changes related to bypass surgery. The lungs are clear. There are chronic bronchitic type interstitial  changes. No pleural effusion or pulmonary lesions. The bony thorax is intact. Stable degenerative changes involving the thoracic spine.  IMPRESSION: No acute cardiopulmonary findings.  Chronic lung changes.   Electronically Signed   By: Marijo Sanes M.D.   On: 07/31/2015 21:19   Dg Chest 2 View  07/11/2015   CLINICAL DATA:  Chest pain shortness of breath when walking. Previously placed coronary artery stents.  EXAM: CHEST  2 VIEW  COMPARISON:  06/30/2015.  FINDINGS: Normal sized heart. Stable mildly prominent pulmonary vasculature and interstitial markings. Coronary artery stents are again noted as well as post CABG changes. Diffuse osteopenia. Thoracic spine degenerative changes.  IMPRESSION: No acute abnormality. Stable mild pulmonary vascular congestion and mild chronic interstitial lung disease.   Electronically Signed   By: Claudie Revering M.D.   On: 07/11/2015 14:22    ECG  RSR, 71, delayed R progression - no acute st/t changes.  ASSESSMENT AND PLAN  1.  Unstable Angina/CAD:  Pt has a h/o CAD s/p CABG x 2 in 2002 with multiple caths since along with interventions upon the OM in 2013 and PTCA of the distal LAD (2.5 mm vessel) @ site of the LIMA anastomosis in May of this year.  He has had 2 caths since then, in July and again in August, both of which revealing stable anatomy w/o a target for intervention.  Since July, he has had somewhat constant c/p, lasting days to weeks at a time.  He was last admitted to [facility] for the same c/p and it was felt that perhaps it was related to esophageal thickening vs healing rib fx, which were both noted on CTA of the chest.  He presented to [facility] night 2/2 ongoing c/p, elevated BP's, and lower ext edema.  Despite prolonged duration of c/p (several days), he has no objective evidence of ischemia and ECG and CE are wnl.  His c/p is sometimes reproducible with palpation by his report.  In the absence of objective evidence of ischemia with somewhat atypical symptoms,  and repeated invasive evaluations, I would not pursue any additional ischemic evaluation at this time.  He is on a good dose of imdur (120), low dose BB (dose limited by baseline bradycardia), asa, statin, plavix, and acei.  It would be reasonable to add ranexa 500 bid to see if this helps his pain.  Rec outpt f/u @ [facility] cardiology.  2.  Essential HTN:  Pt says that BP has been elevated ever since coreg was stopped @ [facility] in late August 2/2 bradycardia.  His primary cardiologist resumed coreg 3.125 on 9/2 but BP's have still been trending high @ home.  I will titrate lisinopril to 40.  He also has evidence of volume overload and I will give another dose of IV lasix now.  3.  Acute on chronic diastolic CHF: EF 12-19% by echo in August.  He has noted higher BP's @ home along with increasing abd girth, DOE, and lower ext edema.  His wt, which was under 215 lbs in August is currently @ 221 and he does have evidence of volume overload on exam (JVD, edema).  He received lasix 40 IV last night.  I will give him 80 x 1 now.  Titrate ACEI for better BP control.  HR stable.  I suspect that he may be ready for D/C in AM if we can improve his volume status today.  4.  HL:  On high potency statin.  LDL 78 8/26.  5.  DM II:  Per IM.  Signed, Murray Hodgkins, NP 08/01/2015, 11:00 AM   Attending Note Patient seen and examined, agree with detailed note above,  Patient presentation and plan discussed on rounds.   Chronic chest pain over the past several months Long prior history of coronary artery disease, intervention Atypical pain with most of his symptoms and a small region left of the lower mediastinum, Symptoms at rest. Catheterization at [facility] 2 in the past several months with no intervention. Catheterization performed for similar symptoms.  He has been started on ranexa and already feels that his symptoms have improved. Blood pressure an issue at home, Continues to be elevated in the  hospital. --cardiac enzymes negative, EKG unchanged. No new indication for ischemia that warrants workup  ----For blood pressure, consider adding amlodipine  5-10 mg daily, can also increase lisinopril up to 40 mg  --Would continue ranexa 5 mg twice a day for 1 week then up to 1000 mg twice a day --Continue isosorbide --Close follow-up with [facility] cardiology --Once blood pressure better controlled, with discharge to home   Signed: Esmond Plants  M.D., Ph.D. [facility]

## 2015-08-01 NOTE — ED Notes (Signed)
RN and MD return to bedside for patient reassessment. Patient initially to be discharged, however continues to report LEFT side CP. MD with VORB to repeat Morphine 73m IVP dose. MD to speak with hospitalist regarding admission. POC discussed at length with patient.

## 2015-08-02 ENCOUNTER — Other Ambulatory Visit: Payer: Self-pay | Admitting: Family Medicine

## 2015-08-02 ENCOUNTER — Telehealth: Payer: Self-pay | Admitting: Family Medicine

## 2015-08-02 DIAGNOSIS — I208 Other forms of angina pectoris: Secondary | ICD-10-CM | POA: Diagnosis not present

## 2015-08-02 DIAGNOSIS — I25119 Atherosclerotic heart disease of native coronary artery with unspecified angina pectoris: Secondary | ICD-10-CM | POA: Diagnosis not present

## 2015-08-02 LAB — GLUCOSE, CAPILLARY: Glucose-Capillary: 101 mg/dL — ABNORMAL HIGH (ref 65–99)

## 2015-08-02 MED ORDER — FUROSEMIDE 40 MG PO TABS: 40.0000 mg | ORAL_TABLET | Freq: Two times a day (BID) | ORAL | Status: DC

## 2015-08-02 MED ORDER — FUROSEMIDE 40 MG PO TABS
40.0000 mg | ORAL_TABLET | Freq: Every day | ORAL | Status: DC
  Filled 2015-08-02: qty 1

## 2015-08-02 MED ORDER — RANOLAZINE ER 500 MG PO TB12: 500.0000 mg | ORAL_TABLET | Freq: Two times a day (BID) | ORAL | Status: DC

## 2015-08-02 MED ORDER — POTASSIUM CHLORIDE CRYS ER 10 MEQ PO TBCR: 10.0000 meq | EXTENDED_RELEASE_TABLET | Freq: Two times a day (BID) | ORAL | Status: DC

## 2015-08-02 MED ORDER — FUROSEMIDE 40 MG PO TABS
40.0000 mg | ORAL_TABLET | Freq: Two times a day (BID) | ORAL | Status: DC
Start: 2015-08-02 — End: 2015-08-02
  Administered 2015-08-02: 40 mg via ORAL

## 2015-08-02 NOTE — Telephone Encounter (Signed)
FYI

## 2015-08-02 NOTE — Plan of Care (Signed)
Problem: Discharge Progression Outcomes Goal: Other Discharge Outcomes/Goals Outcome: Progressing Pt is alert and oriented x 4, up with one assist, on room air, fair appetite, denies chest pain, denies n/v, hydrocodone for chronic back pain x 1, influenza vaccine given but patient refused pne vaccine. Pt is d/c to home, appointment scheduled to f/u with heart failure clinic and pcp. On room air. Medications sent to pharmacy via e script.

## 2015-08-02 NOTE — Discharge Summary (Signed)
[facility] NAME: Lawrence [name]    MR#:  [id number]  DATE OF BIRTH:  04/14/1956  DATE OF ADMISSION:  07/31/2015 ADMITTING PHYSICIAN: Lance Coon, MD  DATE OF DISCHARGE: 08/02/2015  PRIMARY CARE PHYSICIAN: [name], MD    ADMISSION DIAGNOSIS:  Angina at rest [I20.8] Chest pain, unspecified chest pain type [R07.9]  DISCHARGE DIAGNOSIS:  Principal Problem:   Angina at rest Active Problems:   CAD (coronary artery disease)   HTN (hypertension)   DM (diabetes mellitus)   GERD (gastroesophageal reflux disease)   HLD (hyperlipidemia)   COPD (chronic obstructive pulmonary disease)   BPH (benign prostatic hyperplasia)   Essential hypertension   Chronic diastolic CHF (congestive heart failure)   SECONDARY DIAGNOSIS:   Past Medical History  Diagnosis Date  . CAD (coronary artery disease)     a. 2002 CABGx2 (LIMA->LAD, VG->VG->OM1);  b. 09/2012 DES->OM;  c. 03/2015 PTCA of LAD [facility]) in setting of atretic LIMA; d. 05/2015 Cath [facility]): nonobs dzs; e. 06/2015 Cath ([facility]): LM nl, LAD 45p/d ISR, 50d, D1/2 small, LCX 50p/d ISR, OM1 70ost, 30 ISR, VG->OM1 50ost, 36m LIMA->LAD 99p/d - atretic, RCA dominant, nl.  . Essential hypertension   . COPD (chronic obstructive pulmonary disease)     a. Chronic bronchitis and emphysema.  . Type II diabetes mellitus   . Asthma   . Celiac disease   . Chronic diastolic CHF (congestive heart failure)     a. 06/2009 Echo: EF 60-65%, Gr 1 DD, triv AI, mildly dil LA, nl RV.  .Marland KitchenAnxiety   . History of tobacco abuse     a. Quit 2014.  .Marland KitchenPSVT (paroxysmal supraventricular tachycardia)     a. 10/2012 Noted on Zio Patch.    HOSPITAL COURSE:   1. Chest pain, coronary artery disease. Patient was seen in consultation by cardiology and recommended medical management. We added Ranexa to the patient's regimen. He did get nauseous with this medication last night and actually vomited but is willing to try  it as outpatient since his chest pain has disappeared. 2. Accelerated hypertension- blood pressure much improved this morning. Continue usual medications. 3. Acute on chronic diastolic congestive heart failure- Lasix was given IV a few doses while here and switched over to 40 mg by mouth twice a day upon discharge. Patient swelling is better. 4. COPD- slight lower lung expiratory wheeze continue inhalers at home 5. Anxiety- continue usual medications  DISCHARGE CONDITIONS:   Satisfactory  CONSULTS OBTAINED:  Treatment Team:  TMinna Merritts MD  DRUG ALLERGIES:   Allergies  Allergen Reactions  . Albuterol Sulfate [Albuterol] Other (See Comments)    Speeds heart rate up, but Xopenex wasn't effective so MD changed to Albuterol even though it causes palpatations  . Demerol [Meperidine] Hives  . Sulfa Antibiotics Hives  . Morphine Sulfate Rash    Nausea and vomiting    DISCHARGE MEDICATIONS:   Current Discharge Medication List    START taking these medications   Details  ranolazine (RANEXA) 500 MG 12 hr tablet Take 1 tablet (500 mg total) by mouth 2 (two) times daily. Qty: 60 tablet, Refills: 0      CONTINUE these medications which have CHANGED   Details  furosemide (LASIX) 40 MG tablet Take 1 tablet (40 mg total) by mouth 2 (two) times daily. Qty: 60 tablet, Refills: 0    potassium chloride (K-DUR,KLOR-CON) 10 MEQ tablet Take 1 tablet (10 mEq total) by  mouth 2 (two) times daily. Qty: 60 tablet, Refills: 0      CONTINUE these medications which have NOT CHANGED   Details  albuterol (PROVENTIL HFA;VENTOLIN HFA) 108 (90 BASE) MCG/ACT inhaler Inhale 2 puffs into the lungs every 6 (six) hours as needed for wheezing. Qty: 1 Inhaler, Refills: 0   Associated Diagnoses: COPD exacerbation    ALPRAZolam (XANAX) 1 MG tablet TAKE 1/2-1 TABLET BY MOUTH 3 TIMES A DAY Qty: 90 tablet, Refills: 3    aspirin EC 81 MG tablet Take 81 mg by mouth every morning.    atorvastatin  (LIPITOR) 80 MG tablet Take 1 tablet (80 mg total) by mouth at bedtime. Qty: 90 tablet, Refills: 3    carvedilol (COREG) 3.125 MG tablet Take 3.125 mg by mouth daily.    cetirizine (ZYRTEC) 10 MG tablet Take 10 mg by mouth at bedtime.     clopidogrel (PLAVIX) 75 MG tablet TAKE ONE (1) TABLET EACH DAY Qty: 30 tablet, Refills: 5    hyoscyamine (LEVSIN SL) 0.125 MG SL tablet Place 0.125 mg under the tongue every 6 (six) hours as needed for cramping.     isosorbide mononitrate (IMDUR) 60 MG 24 hr tablet Take 120 mg by mouth daily.     lansoprazole (PREVACID) 30 MG capsule Take 30 mg by mouth every morning.     lisinopril (PRINIVIL,ZESTRIL) 20 MG tablet Take 20 mg by mouth daily.    metFORMIN (GLUCOPHAGE) 500 MG tablet Take 1 tablet (500 mg total) by mouth every morning. Qty: 90 tablet, Refills: 4    nitroGLYCERIN (NITROSTAT) 0.4 MG SL tablet Place 1 tablet (0.4 mg total) under the tongue every 5 (five) minutes as needed for chest pain. Every 5 minutes as needed for chest pain Qty: 30 tablet, Refills: 1    omega-3 acid ethyl esters (LOVAZA) 1 G capsule TAKE FOUR CAPSULES BY MOUTH DAILY Qty: 120 capsule, Refills: 12    oxyCODONE-acetaminophen (PERCOCET) 10-325 MG per tablet Take 1 tablet by mouth every 4 (four) hours as needed for pain. Qty: 150 tablet, Refills: 0    tamsulosin (FLOMAX) 0.4 MG CAPS capsule Take 0.4 mg by mouth daily.    tiotropium (SPIRIVA) 18 MCG inhalation capsule Place 18 mcg into inhaler and inhale daily.         DISCHARGE INSTRUCTIONS:   Follow-up at the CHF clinic. Follow-up your cardiologist 2 weeks. Follow-up PMD one week.  If you experience worsening of your admission symptoms, develop shortness of breath, life threatening emergency, suicidal or homicidal thoughts you must seek medical attention immediately by calling 911 or calling your MD immediately  if symptoms less severe.  You Must read complete instructions/literature along with all the possible  adverse reactions/side effects for all the Medicines you take and that have been prescribed to you. Take any new Medicines after you have completely understood and accept all the possible adverse reactions/side effects.   Please note  You were cared for by a hospitalist during your hospital stay. If you have any questions about your discharge medications or the care you received while you were in the hospital after you are discharged, you can call the unit and asked to speak with the hospitalist on call if the hospitalist that took care of you is not available. Once you are discharged, your primary care physician will handle any further medical issues. Please note that NO REFILLS for any discharge medications will be authorized once you are discharged, as it is imperative that you return to  your primary care physician (or establish a relationship with a primary care physician if you do not have one) for your aftercare needs so that they can reassess your need for medications and monitor your lab values.    Today   CHIEF COMPLAINT:   Chief Complaint  Patient presents with  . Hypertension  . Chest Pain    HISTORY OF PRESENT ILLNESS:  Lawrence [name]  is a 59 y.o. male with a known history of coronary artery disease presented with chest pain at rest and with exertion.   VITAL SIGNS:  Blood pressure 108/56, pulse 69, temperature 98.2 F (36.8 C), temperature source Oral, resp. rate 17, height 5' 7"  (1.702 m), weight 100.653 kg (221 lb 14.4 oz), SpO2 93 %.  I/O:   Intake/Output Summary (Last 24 hours) at 08/02/15 0901 Last data filed at 08/02/15 0700  Gross per 24 hour  Intake    580 ml  Output   1990 ml  Net  -1410 ml    PHYSICAL EXAMINATION:  GENERAL:  59 y.o.-year-old patient lying in the bed with no acute distress.  EYES: Pupils equal, round, reactive to light and accommodation. No scleral icterus. Extraocular muscles intact.  HEENT: Head atraumatic, normocephalic. Oropharynx and  nasopharynx clear.  NECK:  Supple, no jugular venous distention. No thyroid enlargement, no tenderness.  LUNGS: Normal breath sounds bilaterally, slight expiratory wheezing bilateral lower lung field, no rales,rhonchi or crepitation. No use of accessory muscles of respiration.  CARDIOVASCULAR: S1, S2 normal. No murmurs, rubs, or gallops.  ABDOMEN: Soft, non-tender, non-distended. Bowel sounds present. No organomegaly or mass.  EXTREMITIES: 2+ edema, no cyanosis, or clubbing.  NEUROLOGIC: Cranial nerves II through XII are intact. Muscle strength 5/5 in all extremities. Sensation intact. Gait not checked.  PSYCHIATRIC: The patient is alert and oriented x 3.  SKIN: No obvious rash, lesion, or ulcer.   DATA REVIEW:   CBC  Recent Labs Lab 08/01/15 0712  WBC 5.3  HGB 14.7  HCT 45.1  PLT 218    Chemistries   Recent Labs Lab 08/01/15 0712  NA 143  K 3.8  CL 104  CO2 33*  GLUCOSE 107*  BUN 16  CREATININE 1.02  CALCIUM 9.2    Cardiac Enzymes  Recent Labs Lab 08/01/15 1858  TROPONINI <0.03    Microbiology Results  Results for orders placed or performed during the hospital encounter of 07/11/15  MRSA PCR Screening     Status: None   Collection Time: 07/11/15  9:09 PM  Result Value Ref Range Status   MRSA by PCR NEGATIVE NEGATIVE Final    Comment:        The GeneXpert MRSA Assay (FDA approved for NASAL specimens only), is one component of a comprehensive MRSA colonization surveillance program. It is not intended to diagnose MRSA infection nor to guide or monitor treatment for MRSA infections.     RADIOLOGY:  Dg Chest 2 View  07/31/2015   CLINICAL DATA:  Chest pain today.  EXAM: CHEST  2 VIEW  COMPARISON:  07/11/2015  FINDINGS: The cardiac silhouette, mediastinal and hilar contours are within normal limits and stable. There are surgical changes related to bypass surgery. The lungs are clear. There are chronic bronchitic type interstitial changes. No pleural  effusion or pulmonary lesions. The bony thorax is intact. Stable degenerative changes involving the thoracic spine.  IMPRESSION: No acute cardiopulmonary findings.  Chronic lung changes.   Electronically Signed   By: Marijo Sanes M.D.   On: 07/31/2015  21:19   Management plans discussed with the patient, and he is in agreement.  CODE STATUS:     Code Status Orders        Start     Ordered   08/01/15 0522  Full code   Continuous     08/01/15 0521      TOTAL TIME TAKING CARE OF THIS PATIENT: 35 minutes.    Loletha Grayer M.D on 08/02/2015 at 9:01 AM  Between 7am to 6pm - Pager - [phone]  After 6pm go to [email or web address] - password [email or web address] [place] [facility]  Office  [phone]  CC: Primary care physician; [name], MD

## 2015-08-02 NOTE — Telephone Encounter (Signed)
Pt is being discharged from [facility] for chest pain.  I have scheduled a hospital follow up appt for 08/09/2015/MW

## 2015-08-02 NOTE — Progress Notes (Signed)
Patient Name: Lawrence [name] Date of Encounter: 08/02/2015     Principal Problem:   Angina at rest Active Problems:   CAD (coronary artery disease)   HTN (hypertension)   DM (diabetes mellitus)   GERD (gastroesophageal reflux disease)   HLD (hyperlipidemia)   COPD (chronic obstructive pulmonary disease)   BPH (benign prostatic hyperplasia)   Essential hypertension   Chronic diastolic CHF (congestive heart failure)    SUBJECTIVE  Chest pain and dyspnea much improved however he developed nausea with an episode of vomiting yesterday afternoon.  CURRENT MEDS . amLODipine  10 mg Oral Daily  . aspirin EC  81 mg Oral q morning - 10a  . atorvastatin  80 mg Oral QHS  . carvedilol  3.125 mg Oral BID WC  . clopidogrel  75 mg Oral Daily  . enoxaparin (LOVENOX) injection  40 mg Subcutaneous Q24H  . furosemide  40 mg Oral Daily  . Influenza vac split quadrivalent PF  0.5 mL Intramuscular Tomorrow-1000  . insulin aspart  0-15 Units Subcutaneous TID WC  . insulin aspart  0-5 Units Subcutaneous QHS  . isosorbide mononitrate  120 mg Oral Daily  . lisinopril  20 mg Oral Daily  . metFORMIN  500 mg Oral Q breakfast  . pantoprazole  40 mg Oral QAC breakfast  . pneumococcal 23 valent vaccine  0.5 mL Intramuscular Tomorrow-1000  . ranolazine  500 mg Oral BID  . sodium chloride  3 mL Intravenous Q12H  . tamsulosin  0.4 mg Oral Daily  . tiotropium  18 mcg Inhalation Daily    OBJECTIVE  Filed Vitals:   08/01/15 1105 08/01/15 1550 08/01/15 1946 08/02/15 0430  BP: 189/90 119/76 126/74 108/56  Pulse: 53 70 79 69  Temp: 97.5 F (36.4 C)  97.5 F (36.4 C) 98.2 F (36.8 C)  TempSrc: Oral  Oral Oral  Resp: 18  18 17   Height:      Weight:      SpO2: 94%  92% 93%    Intake/Output Summary (Last 24 hours) at 08/02/15 0902 Last data filed at 08/02/15 0700  Gross per 24 hour  Intake    580 ml  Output   1990 ml  Net  -1410 ml   Filed Weights   07/31/15 2053 08/01/15 0528  Weight:  231 lb (104.781 kg) 221 lb 14.4 oz (100.653 kg)    PHYSICAL EXAM  General: Pleasant, NAD. Neuro: Alert and oriented X 3. Moves all extremities spontaneously. Psych: Normal affect. HEENT:  Normal  Neck: Supple without bruits or JVD. Lungs:  Resp regular and unlabored, diminished breath sounds bila with exp wheezing. Heart: RRR no s3, s4, or murmurs. Abdomen: Soft, non-tender, non-distended, BS + x 4.  Extremities: No clubbing, cyanosis.  Trace bilat LE edema. DP/PT/Radials 2+ and equal bilaterally.  Accessory Clinical Findings  CBC  Recent Labs  07/31/15 2057 08/01/15 0712  WBC 6.6 5.3  HGB 14.6 14.7  HCT 44.2 45.1  MCV 87.5 87.4  PLT 234 409   Basic Metabolic Panel  Recent Labs  07/31/15 2057 08/01/15 0712  NA 140 143  K 3.7 3.8  CL 105 104  CO2 28 33*  GLUCOSE 92 107*  BUN 16 16  CREATININE 1.07 1.02  CALCIUM 9.1 9.2   Cardiac Enzymes  Recent Labs  08/01/15 0712 08/01/15 1251 08/01/15 1858  TROPONINI <0.03 <0.03 <0.03   TELE  rsr  Radiology/Studies  Dg Chest 2 View  07/31/2015   CLINICAL DATA:  Chest  pain today.  EXAM: CHEST  2 VIEW  COMPARISON:  07/11/2015  FINDINGS: The cardiac silhouette, mediastinal and hilar contours are within normal limits and stable. There are surgical changes related to bypass surgery. The lungs are clear. There are chronic bronchitic type interstitial changes. No pleural effusion or pulmonary lesions. The bony thorax is intact. Stable degenerative changes involving the thoracic spine.  IMPRESSION: No acute cardiopulmonary findings.  Chronic lung changes.   Electronically Signed   By: Marijo Sanes M.D.   On: 07/31/2015 21:19   Dg Chest 2 View  07/11/2015   CLINICAL DATA:  Chest pain shortness of breath when walking. Previously placed coronary artery stents.  EXAM: CHEST  2 VIEW  COMPARISON:  06/30/2015.  FINDINGS: Normal sized heart. Stable mildly prominent pulmonary vasculature and interstitial markings. Coronary artery  stents are again noted as well as post CABG changes. Diffuse osteopenia. Thoracic spine degenerative changes.  IMPRESSION: No acute abnormality. Stable mild pulmonary vascular congestion and mild chronic interstitial lung disease.   Electronically Signed   By: Claudie Revering M.D.   On: 07/11/2015 14:22    ASSESSMENT AND PLAN  1. Unstable Angina/CAD: Pt has a h/o CAD s/p CABG x 2 in 2002 with multiple caths since along with interventions upon the OM in 2013 and PTCA of the distal LAD (2.5 mm vessel) @ site of the LIMA anastomosis in May of this year. He has had 2 caths since then, in July and again in August, both of which revealing stable anatomy w/o a target for intervention. Since July, he has had somewhat constant c/p, lasting days to weeks at a time. He was last admitted to [facility] for the same c/p and it was felt that perhaps it was related to esophageal thickening vs healing rib fx, which were both noted on CTA of the chest. He presented to [facility] on 9/14 2/2 ongoing c/p, elevated BP's, and lower ext edema. CE neg.  ECG non-acute.  Ranexa added yesterday and he reports resolution of c/p.  No further ischemic eval planned. He is on a good dose of imdur (120), low dose BB (dose limited by baseline bradycardia), asa, statin, plavix, and acei.  Rec outpt f/u @ [facility] cardiology.  2. Essential HTN: Pt says that BP has been elevated ever since coreg was stopped @ [facility] in late August 2/2 bradycardia. His primary cardiologist resumed coreg 3.125 on 9/2.  ACEI titrated yesterday and BP better this AM.  3. Acute on chronic diastolic CHF: EF 77-82% by echo in August. Feeling much better with diuresis.  Minus 2.1 L overnight.  Volume looks better this AM.  Was on lasix 40 daily @ home.  Will change to 40 BID.  We discussed the importance of daily weights, sodium restriction, medication compliance, and symptom reporting and he verbalizes understanding. He should have f/u within 7 days of d/c.  Sched to see CHF  Clinic on 9/26.  4. HL: On high potency statin. LDL 78 8/26.  5. DM II: Per IM.  6.  Nausea/Vomiting:  Vomited x 1 yesterday afternoon.  This occurred @ some point after receiving ranexa.  Not clear that that is the cause however.  Would continue ranexa for now.  Signed, Murray Hodgkins NP

## 2015-08-02 NOTE — Progress Notes (Signed)
Notifed Dr. Leslye Peer via telephone that patient has d/c order needing reviewed for po lasix, per MD discontinue order because e script has already been sent.

## 2015-08-02 NOTE — Telephone Encounter (Signed)
Pt contacted office for refill request on the following medications:  oxyCODONE-acetaminophen (PERCOCET) 10-325 MG.  EU#[phone]/[id number]

## 2015-08-05 MED ORDER — OXYCODONE-ACETAMINOPHEN 10-325 MG PO TABS: 1.0000 | ORAL_TABLET | ORAL | Status: DC | PRN

## 2015-08-09 ENCOUNTER — Inpatient Hospital Stay: Payer: Medicare PPO | Admitting: Family Medicine

## 2015-08-12 ENCOUNTER — Ambulatory Visit: Payer: Medicare PPO | Admitting: Family

## 2015-08-23 ENCOUNTER — Telehealth: Payer: Self-pay | Admitting: Family Medicine

## 2015-08-23 IMAGING — CR DG CHEST 1V PORT
1 series · 1 of 1 positions shown · non-contrast
Comparison: 08/30/2013

CLINICAL DATA: Chest pain

EXAM:
PORTABLE CHEST - 1 VIEW

[ap]
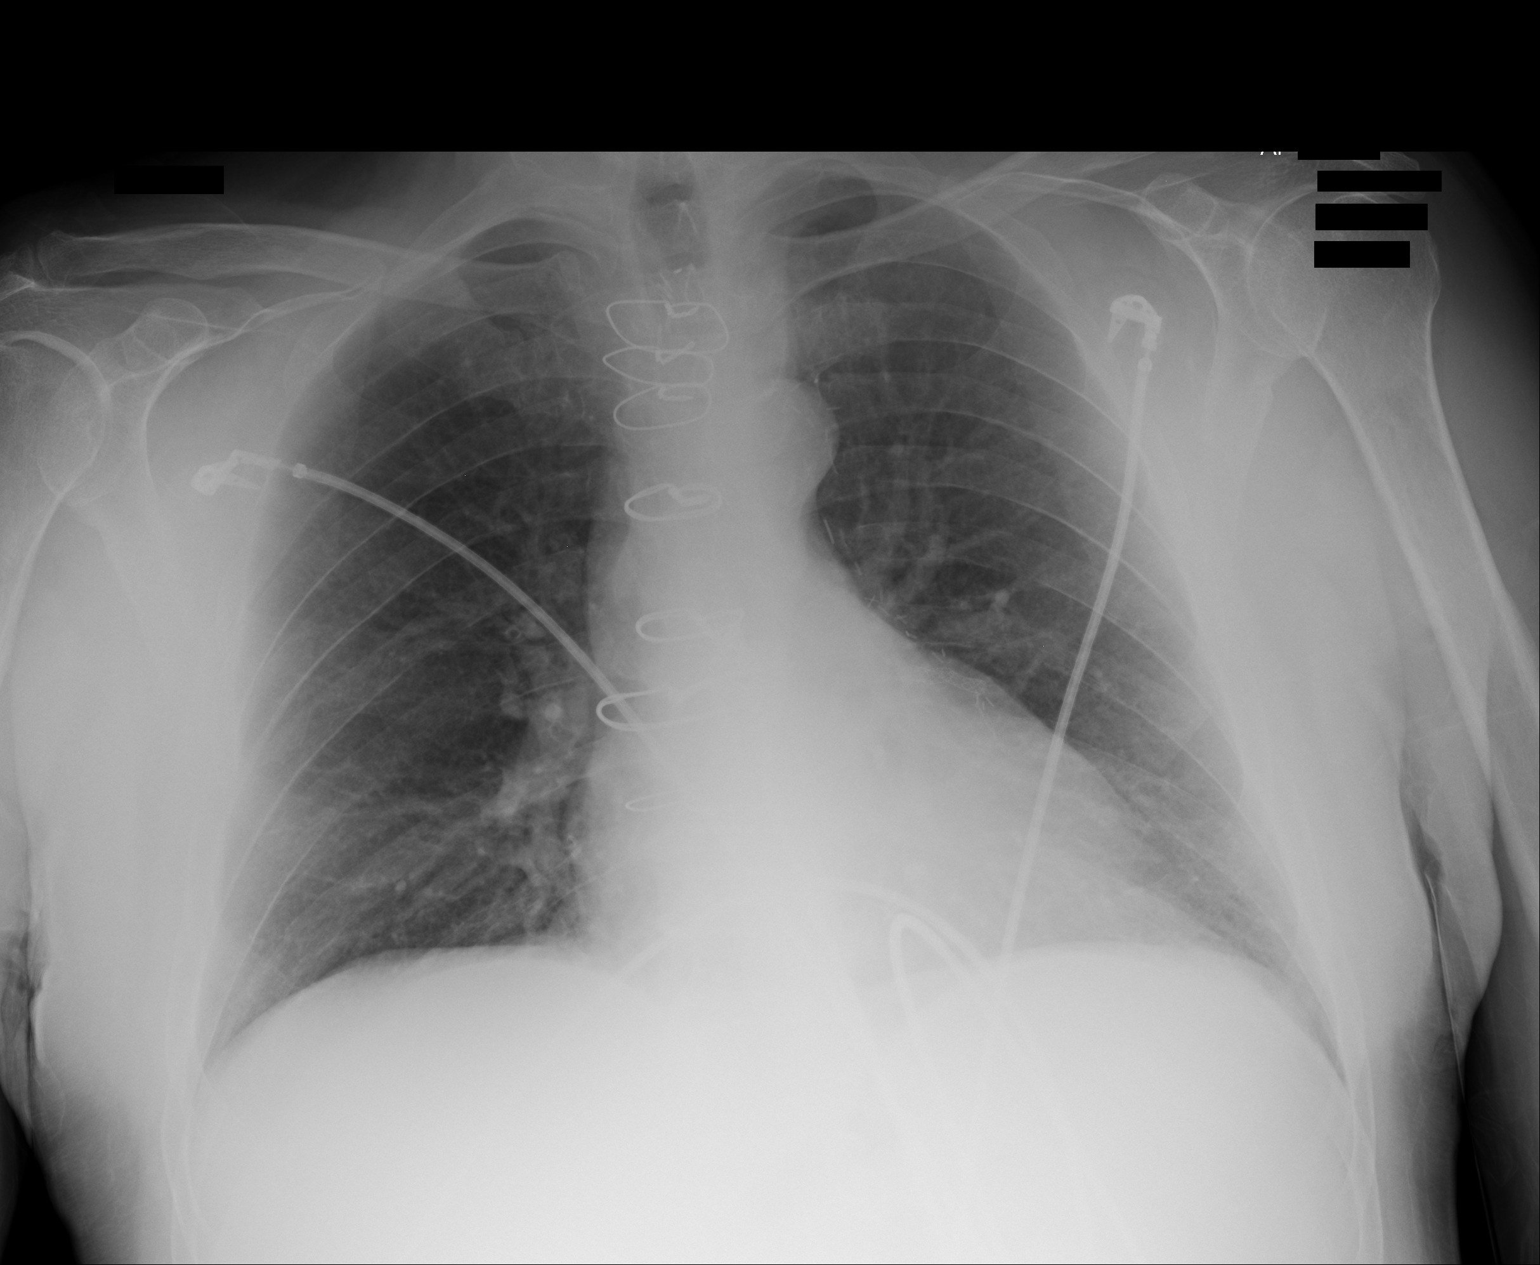

[1 of 1 positions shown; findings below may reference images not displayed]

FINDINGS: Cardiomediastinal silhouette is stable. Status post median
sternotomy again noted. No acute infiltrate or pleural effusion. No
pulmonary edema. Mild hyperinflation again noted.
IMPRESSION: No active disease.

## 2015-08-23 NOTE — Telephone Encounter (Signed)
Pt was discharged from hospital on 08/22/15 and is coming in for f/u on 09/06/15. Thanks TNP

## 2015-08-29 ENCOUNTER — Encounter: Payer: Self-pay | Admitting: Family Medicine

## 2015-08-30 ENCOUNTER — Other Ambulatory Visit: Payer: Self-pay | Admitting: Family Medicine

## 2015-08-30 NOTE — Telephone Encounter (Signed)
Pt contacted office for refill request on the following medications:  oxyCODONE-acetaminophen (PERCOCET) 10-325 MG.  EX#[phone]/[id number]

## 2015-09-02 MED ORDER — OXYCODONE-ACETAMINOPHEN 10-325 MG PO TABS: 1.0000 | ORAL_TABLET | ORAL | Status: DC | PRN

## 2015-09-06 ENCOUNTER — Ambulatory Visit (INDEPENDENT_AMBULATORY_CARE_PROVIDER_SITE_OTHER): Payer: Medicare PPO | Admitting: Family Medicine

## 2015-09-06 ENCOUNTER — Encounter: Payer: Self-pay | Admitting: Family Medicine

## 2015-09-06 VITALS — BP 102/58 | HR 65 | Temp 97.8°F | Resp 16 | Wt 235.0 lb

## 2015-09-06 DIAGNOSIS — I25118 Atherosclerotic heart disease of native coronary artery with other forms of angina pectoris: Secondary | ICD-10-CM

## 2015-09-06 DIAGNOSIS — G4734 Idiopathic sleep related nonobstructive alveolar hypoventilation: Secondary | ICD-10-CM | POA: Insufficient documentation

## 2015-09-06 DIAGNOSIS — J449 Chronic obstructive pulmonary disease, unspecified: Secondary | ICD-10-CM | POA: Diagnosis not present

## 2015-09-06 NOTE — Progress Notes (Signed)
Patient: Lawrence [name] Male    DOB: [birth year]   59 y.o.   MRN: [id number] Visit Date: 09/06/2015  Today's Provider: [name], MD   Chief Complaint  Patient presents with  . Hospitalization Follow-up   Subjective:    HPI   Follow up Hospitalization  Patient was admitted to [facility] on 07/31/2015 and discharged on 08/02/2015. He was treated for chest pain and angina at rest. Was subsequently admitted [facility] 08/20/2015 through 08/22/2015 for chest pain. He had cardiac cath and stenting of his LAD. He staes he has had no chest pain since discharge with no dyspnea or palpitations since discharge.   He was noted to have oxygen desaturations to 83% when sleeping. He does feels sleepy during the day and his wife states he appears to stop breathing when he is sleepy. He was advised to have a sleep study done as he likely has sleep apnea.  ----------------------------------------------------------------------   Allergies  Allergen Reactions  . Albuterol Sulfate [Albuterol] Other (See Comments)    Speeds heart rate up, but Xopenex wasn't effective so MD changed to Albuterol even though it causes palpatations  . Demerol [Meperidine] Hives  . Sulfa Antibiotics Hives  . Morphine Sulfate Rash    Nausea and vomiting   Previous Medications   ALBUTEROL (PROVENTIL HFA;VENTOLIN HFA) 108 (90 BASE) MCG/ACT INHALER    Inhale 2 puffs into the lungs every 6 (six) hours as needed for wheezing.   ALPRAZOLAM (XANAX) 1 MG TABLET    TAKE 1/2-1 TABLET BY MOUTH 3 TIMES A DAY   AMLODIPINE (NORVASC) 5 MG TABLET    Take 10 mg by mouth daily.   ASPIRIN EC 81 MG TABLET    Take 81 mg by mouth every morning.   ATORVASTATIN (LIPITOR) 80 MG TABLET    Take 1 tablet (80 mg total) by mouth at bedtime.   CARVEDILOL (COREG) 3.125 MG TABLET    Take 3.125 mg by mouth daily.   CETIRIZINE (ZYRTEC) 10 MG TABLET    Take 10 mg by mouth at bedtime.    CLOPIDOGREL (PLAVIX) 75 MG TABLET    TAKE ONE (1) TABLET EACH DAY   FUROSEMIDE (LASIX) 40 MG TABLET    Take 1 tablet (40 mg total) by mouth 2 (two) times daily.   HYOSCYAMINE (LEVSIN SL) 0.125 MG SL TABLET    Place 0.125 mg under the tongue every 6 (six) hours as needed for cramping.    ISOSORBIDE MONONITRATE (IMDUR) 60 MG 24 HR TABLET    Take 120 mg by mouth daily.    LANSOPRAZOLE (PREVACID) 30 MG CAPSULE    Take 30 mg by mouth every morning.    LISINOPRIL (PRINIVIL,ZESTRIL) 20 MG TABLET    Take 20 mg by mouth daily.   METFORMIN (GLUCOPHAGE) 500 MG TABLET    Take 1 tablet (500 mg total) by mouth every morning.   NITROGLYCERIN (NITROSTAT) 0.4 MG SL TABLET    Place 1 tablet (0.4 mg total) under the tongue every 5 (five) minutes as needed for chest pain. Every 5 minutes as needed for chest pain   OMEGA-3 ACID ETHYL ESTERS (LOVAZA) 1 G CAPSULE    TAKE FOUR CAPSULES BY MOUTH DAILY   OXYCODONE-ACETAMINOPHEN (PERCOCET) 10-325 MG TABLET    Take 1 tablet by mouth every 4 (four) hours as needed for pain.   POTASSIUM CHLORIDE (K-DUR,KLOR-CON) 10 MEQ TABLET    Take 1 tablet (10 mEq total) by mouth 2 (two) times daily.  RANOLAZINE (RANEXA) 500 MG 12 HR TABLET    Take 1 tablet (500 mg total) by mouth 2 (two) times daily.   TAMSULOSIN (FLOMAX) 0.4 MG CAPS CAPSULE    Take 0.4 mg by mouth daily.   TIOTROPIUM (SPIRIVA) 18 MCG INHALATION CAPSULE    Place 18 mcg into inhaler and inhale daily.    Review of Systems  Constitutional: Positive for fatigue.  Respiratory: Positive for shortness of breath. Negative for chest tightness.   Cardiovascular: Positive for leg swelling. Negative for chest pain and palpitations.  Neurological: Positive for light-headedness. Negative for dizziness and headaches.    Social History  Substance Use Topics  . Smoking status: Former Smoker    Quit date: 04/22/2013  . Smokeless tobacco: Never Used  . Alcohol Use: No     Comment: remotely quit alcohol use. Hx of heavy alcohol use.   Objective:   BP 102/58 mmHg  Pulse 65  Temp(Src) 97.8 F  (36.6 C) (Oral)  Resp 16  Wt 235 lb (106.595 kg)  SpO2 97%    Depression screen [facility] 2/9 09/06/2015  Decreased Interest 0  Down, Depressed, Hopeless 0  PHQ - 2 Score 0  Altered sleeping 0  Tired, decreased energy 2  Change in appetite 0  Feeling bad or failure about yourself  0  Trouble concentrating 0  Moving slowly or fidgety/restless 0  Suicidal thoughts 0  PHQ-9 Score 2  Difficult doing work/chores Not difficult at all      Physical Exam   General Appearance:    Alert, cooperative, no distress, obese  Eyes:    PERRL, conjunctiva/corneas clear, EOM's intact       Lungs:     Bilateral expiratory wheezes, scattered and intermittent, respirations unlabored  Heart:    Regular rate and rhythm  Neurologic:   Awake, alert, oriented x 3. No apparent focal neurological           defect.           Assessment & Plan:     1. Chronic obstructive pulmonary disease, unspecified COPD type ([place]) Stable, but having nocturnal hypoxia as below.   2. Nocturnal hypoxia Witnessed apnea by spouse.  - Nocturnal polysomnography (NPSG); Future  3. Coronary artery disease involving native coronary artery of native heart with other form of angina pectoris ([place]) Asymptomatic since new stent placement and discharge from [facility].        [name], MD  [facility]

## 2015-09-12 ENCOUNTER — Emergency Department: Payer: Medicare PPO

## 2015-09-12 ENCOUNTER — Observation Stay
Admission: EM | Admit: 2015-09-12 | Discharge: 2015-09-14 | Disposition: A | Discharge status: Home / Self Care | Payer: Medicare PPO | Attending: Internal Medicine | Admitting: Internal Medicine

## 2015-09-12 ENCOUNTER — Encounter: Payer: Self-pay | Admitting: Medical Oncology

## 2015-09-12 DIAGNOSIS — Z7901 Long term (current) use of anticoagulants: Secondary | ICD-10-CM | POA: Diagnosis not present

## 2015-09-12 DIAGNOSIS — I200 Unstable angina: Secondary | ICD-10-CM | POA: Insufficient documentation

## 2015-09-12 DIAGNOSIS — I10 Essential (primary) hypertension: Secondary | ICD-10-CM | POA: Insufficient documentation

## 2015-09-12 DIAGNOSIS — Z7951 Long term (current) use of inhaled steroids: Secondary | ICD-10-CM | POA: Diagnosis not present

## 2015-09-12 DIAGNOSIS — Z888 Allergy status to other drugs, medicaments and biological substances status: Secondary | ICD-10-CM | POA: Insufficient documentation

## 2015-09-12 DIAGNOSIS — K900 Celiac disease: Secondary | ICD-10-CM | POA: Insufficient documentation

## 2015-09-12 DIAGNOSIS — M7989 Other specified soft tissue disorders: Secondary | ICD-10-CM | POA: Insufficient documentation

## 2015-09-12 DIAGNOSIS — R072 Precordial pain: Secondary | ICD-10-CM | POA: Diagnosis not present

## 2015-09-12 DIAGNOSIS — Z885 Allergy status to narcotic agent status: Secondary | ICD-10-CM | POA: Diagnosis not present

## 2015-09-12 DIAGNOSIS — Z7902 Long term (current) use of antithrombotics/antiplatelets: Secondary | ICD-10-CM | POA: Diagnosis not present

## 2015-09-12 DIAGNOSIS — Z794 Long term (current) use of insulin: Secondary | ICD-10-CM | POA: Insufficient documentation

## 2015-09-12 DIAGNOSIS — F419 Anxiety disorder, unspecified: Secondary | ICD-10-CM | POA: Diagnosis not present

## 2015-09-12 DIAGNOSIS — E785 Hyperlipidemia, unspecified: Secondary | ICD-10-CM | POA: Insufficient documentation

## 2015-09-12 DIAGNOSIS — I252 Old myocardial infarction: Secondary | ICD-10-CM | POA: Diagnosis not present

## 2015-09-12 DIAGNOSIS — R0902 Hypoxemia: Secondary | ICD-10-CM | POA: Insufficient documentation

## 2015-09-12 DIAGNOSIS — R200 Anesthesia of skin: Secondary | ICD-10-CM | POA: Insufficient documentation

## 2015-09-12 DIAGNOSIS — Z79899 Other long term (current) drug therapy: Secondary | ICD-10-CM | POA: Diagnosis not present

## 2015-09-12 DIAGNOSIS — I5023 Acute on chronic systolic (congestive) heart failure: Secondary | ICD-10-CM | POA: Insufficient documentation

## 2015-09-12 DIAGNOSIS — Z87891 Personal history of nicotine dependence: Secondary | ICD-10-CM | POA: Insufficient documentation

## 2015-09-12 DIAGNOSIS — R079 Chest pain, unspecified: Secondary | ICD-10-CM | POA: Diagnosis present

## 2015-09-12 DIAGNOSIS — K219 Gastro-esophageal reflux disease without esophagitis: Secondary | ICD-10-CM | POA: Insufficient documentation

## 2015-09-12 DIAGNOSIS — Z7984 Long term (current) use of oral hypoglycemic drugs: Secondary | ICD-10-CM | POA: Insufficient documentation

## 2015-09-12 DIAGNOSIS — I471 Supraventricular tachycardia: Secondary | ICD-10-CM | POA: Insufficient documentation

## 2015-09-12 DIAGNOSIS — Z955 Presence of coronary angioplasty implant and graft: Secondary | ICD-10-CM | POA: Insufficient documentation

## 2015-09-12 DIAGNOSIS — Z7982 Long term (current) use of aspirin: Secondary | ICD-10-CM | POA: Diagnosis not present

## 2015-09-12 DIAGNOSIS — I25110 Atherosclerotic heart disease of native coronary artery with unstable angina pectoris: Secondary | ICD-10-CM | POA: Diagnosis not present

## 2015-09-12 DIAGNOSIS — J441 Chronic obstructive pulmonary disease with (acute) exacerbation: Secondary | ICD-10-CM | POA: Insufficient documentation

## 2015-09-12 DIAGNOSIS — E119 Type 2 diabetes mellitus without complications: Secondary | ICD-10-CM | POA: Diagnosis not present

## 2015-09-12 DIAGNOSIS — K220 Achalasia of cardia: Secondary | ICD-10-CM | POA: Insufficient documentation

## 2015-09-12 DIAGNOSIS — Z882 Allergy status to sulfonamides status: Secondary | ICD-10-CM | POA: Insufficient documentation

## 2015-09-12 LAB — GLUCOSE, CAPILLARY: Glucose-Capillary: 109 mg/dL — ABNORMAL HIGH (ref 65–99)

## 2015-09-12 LAB — BASIC METABOLIC PANEL
Anion gap: 8 (ref 5–15)
BUN: 15 mg/dL (ref 6–20)
CO2: 31 mmol/L (ref 22–32)
Calcium: 9.7 mg/dL (ref 8.9–10.3)
Chloride: 103 mmol/L (ref 101–111)
Creatinine, Ser: 1 mg/dL (ref 0.61–1.24)
GFR calc Af Amer: >60 mL/min (ref >60)
GFR calc non Af Amer: >60 mL/min (ref >60)
Glucose, Bld: 112 mg/dL — ABNORMAL HIGH (ref 65–99)
Potassium: 4.1 mmol/L (ref 3.5–5.1)
Sodium: 142 mmol/L (ref 135–145)

## 2015-09-12 LAB — HEPATIC FUNCTION PANEL
ALT: 22 U/L (ref 17–63)
AST: 21 U/L (ref 15–41)
Albumin: 4.1 g/dL (ref 3.5–5.0)
Alkaline Phosphatase: 67 U/L (ref 38–126)
Bilirubin, Direct: <0.1 mg/dL — ABNORMAL LOW (ref 0.1–0.5)
Total Bilirubin: 0.7 mg/dL (ref 0.3–1.2)
Total Protein: 7.3 g/dL (ref 6.5–8.1)

## 2015-09-12 LAB — CBC
HCT: 43.5 % (ref 40.0–52.0)
Hemoglobin: 14.3 g/dL (ref 13.0–18.0)
MCH: 28.6 pg (ref 26.0–34.0)
MCHC: 33 g/dL (ref 32.0–36.0)
MCV: 86.8 fL (ref 80.0–100.0)
Platelets: 226 10*3/uL (ref 150–440)
RBC: 5.01 MIL/uL (ref 4.40–5.90)
RDW: 14.9 % — ABNORMAL HIGH (ref 11.5–14.5)
WBC: 7.4 10*3/uL (ref 3.8–10.6)

## 2015-09-12 LAB — APTT: aPTT: 31 seconds (ref 24–36)

## 2015-09-12 LAB — LIPASE, BLOOD: Lipase: 27 U/L (ref 11–51)

## 2015-09-12 LAB — BRAIN NATRIURETIC PEPTIDE: B Natriuretic Peptide: 20 pg/mL (ref 0.0–100.0)

## 2015-09-12 LAB — TROPONIN I
Troponin I: <0.03 ng/mL (ref <0.031)
Troponin I: <0.03 ng/mL (ref <0.031)

## 2015-09-12 LAB — PROTIME-INR
INR: 0.99
Prothrombin Time: 13.3 seconds (ref 11.4–15.0)

## 2015-09-12 MED ORDER — ISOSORBIDE MONONITRATE ER 60 MG PO TB24
120.0000 mg | ORAL_TABLET | Freq: Every day | ORAL | Status: DC
  Administered 2015-09-13 – 2015-09-14 (×2): 120 mg via ORAL
  Filled 2015-09-12 (×3): qty 2

## 2015-09-12 MED ORDER — ALBUTEROL SULFATE (2.5 MG/3ML) 0.083% IN NEBU: 2.5000 mg | INHALATION_SOLUTION | Freq: Four times a day (QID) | RESPIRATORY_TRACT | Status: DC | PRN

## 2015-09-12 MED ORDER — NITROGLYCERIN 0.4 MG SL SUBL: 0.4000 mg | SUBLINGUAL_TABLET | SUBLINGUAL | Status: DC | PRN

## 2015-09-12 MED ORDER — ONDANSETRON HCL 4 MG/2ML IJ SOLN
4.0000 mg | Freq: Four times a day (QID) | INTRAMUSCULAR | Status: DC | PRN
  Administered 2015-09-12 – 2015-09-14 (×6): 4 mg via INTRAVENOUS
  Filled 2015-09-12 (×6): qty 2

## 2015-09-12 MED ORDER — AMLODIPINE BESYLATE 5 MG PO TABS
5.0000 mg | ORAL_TABLET | Freq: Every day | ORAL | Status: DC
  Administered 2015-09-12 – 2015-09-14 (×3): 5 mg via ORAL
  Filled 2015-09-12 (×3): qty 1

## 2015-09-12 MED ORDER — ASPIRIN 81 MG PO CHEW
324.0000 mg | CHEWABLE_TABLET | ORAL | Status: AC
  Administered 2015-09-12: 324 mg via ORAL
  Filled 2015-09-12: qty 4

## 2015-09-12 MED ORDER — ACETAMINOPHEN 325 MG PO TABS: 650.0000 mg | ORAL_TABLET | ORAL | Status: DC | PRN

## 2015-09-12 MED ORDER — POTASSIUM CHLORIDE CRYS ER 10 MEQ PO TBCR
10.0000 meq | EXTENDED_RELEASE_TABLET | Freq: Two times a day (BID) | ORAL | Status: DC
  Administered 2015-09-13 – 2015-09-14 (×3): 10 meq via ORAL
  Filled 2015-09-12 (×4): qty 1

## 2015-09-12 MED ORDER — PANTOPRAZOLE SODIUM 40 MG PO TBEC
40.0000 mg | DELAYED_RELEASE_TABLET | Freq: Every day | ORAL | Status: DC
  Administered 2015-09-13 – 2015-09-14 (×2): 40 mg via ORAL
  Filled 2015-09-12 (×3): qty 1

## 2015-09-12 MED ORDER — TIOTROPIUM BROMIDE MONOHYDRATE 18 MCG IN CAPS
18.0000 ug | ORAL_CAPSULE | Freq: Every day | RESPIRATORY_TRACT | Status: DC
  Administered 2015-09-14: 18 ug via RESPIRATORY_TRACT
  Filled 2015-09-12: qty 5

## 2015-09-12 MED ORDER — LORATADINE 10 MG PO TABS
10.0000 mg | ORAL_TABLET | Freq: Every day | ORAL | Status: DC
  Administered 2015-09-12 – 2015-09-14 (×3): 10 mg via ORAL
  Filled 2015-09-12 (×3): qty 1

## 2015-09-12 MED ORDER — ATORVASTATIN CALCIUM 20 MG PO TABS
80.0000 mg | ORAL_TABLET | Freq: Every day | ORAL | Status: DC
  Administered 2015-09-12 – 2015-09-13 (×2): 80 mg via ORAL
  Filled 2015-09-12 (×2): qty 4

## 2015-09-12 MED ORDER — OXYCODONE HCL 5 MG PO TABS
5.0000 mg | ORAL_TABLET | ORAL | Status: DC | PRN
  Administered 2015-09-12 – 2015-09-14 (×6): 5 mg via ORAL
  Filled 2015-09-12 (×6): qty 1

## 2015-09-12 MED ORDER — MORPHINE SULFATE (PF) 2 MG/ML IV SOLN
2.0000 mg | Freq: Once | INTRAVENOUS | Status: AC
  Administered 2015-09-12: 2 mg via INTRAVENOUS
  Filled 2015-09-12: qty 1

## 2015-09-12 MED ORDER — FUROSEMIDE 40 MG PO TABS
40.0000 mg | ORAL_TABLET | Freq: Two times a day (BID) | ORAL | Status: DC
  Administered 2015-09-13 – 2015-09-14 (×3): 40 mg via ORAL
  Filled 2015-09-12 (×3): qty 1

## 2015-09-12 MED ORDER — ASPIRIN EC 81 MG PO TBEC
81.0000 mg | DELAYED_RELEASE_TABLET | Freq: Every day | ORAL | Status: DC
  Administered 2015-09-13 – 2015-09-14 (×2): 81 mg via ORAL
  Filled 2015-09-12 (×2): qty 1

## 2015-09-12 MED ORDER — OXYCODONE-ACETAMINOPHEN 10-325 MG PO TABS: 1.0000 | ORAL_TABLET | ORAL | Status: DC | PRN

## 2015-09-12 MED ORDER — NITROGLYCERIN 2 % TD OINT
1.0000 [in_us] | TOPICAL_OINTMENT | Freq: Four times a day (QID) | TRANSDERMAL | Status: DC
  Administered 2015-09-12 – 2015-09-14 (×6): 1 [in_us] via TOPICAL
  Filled 2015-09-12 (×5): qty 1

## 2015-09-12 MED ORDER — ENOXAPARIN SODIUM 40 MG/0.4ML ~~LOC~~ SOLN
40.0000 mg | SUBCUTANEOUS | Status: DC
  Administered 2015-09-12 – 2015-09-13 (×2): 40 mg via SUBCUTANEOUS
  Filled 2015-09-12 (×2): qty 0.4

## 2015-09-12 MED ORDER — CLOPIDOGREL BISULFATE 75 MG PO TABS
75.0000 mg | ORAL_TABLET | Freq: Every day | ORAL | Status: DC
  Administered 2015-09-13 – 2015-09-14 (×2): 75 mg via ORAL
  Filled 2015-09-12 (×2): qty 1

## 2015-09-12 MED ORDER — ASPIRIN EC 81 MG PO TBEC
81.0000 mg | DELAYED_RELEASE_TABLET | Freq: Once | ORAL | Status: AC
  Administered 2015-09-12: 81 mg via ORAL
  Filled 2015-09-12: qty 1

## 2015-09-12 MED ORDER — ALPRAZOLAM 0.25 MG PO TABS
0.2500 mg | ORAL_TABLET | Freq: Three times a day (TID) | ORAL | Status: DC | PRN
  Administered 2015-09-12: 0.25 mg via ORAL
  Filled 2015-09-12: qty 1

## 2015-09-12 MED ORDER — LISINOPRIL 20 MG PO TABS
20.0000 mg | ORAL_TABLET | Freq: Every day | ORAL | Status: DC
  Administered 2015-09-13 – 2015-09-14 (×2): 20 mg via ORAL
  Filled 2015-09-12 (×3): qty 1

## 2015-09-12 MED ORDER — RANOLAZINE ER 500 MG PO TB12
500.0000 mg | ORAL_TABLET | Freq: Two times a day (BID) | ORAL | Status: DC
  Administered 2015-09-13 – 2015-09-14 (×3): 500 mg via ORAL
  Filled 2015-09-12 (×4): qty 1

## 2015-09-12 MED ORDER — OXYCODONE-ACETAMINOPHEN 5-325 MG PO TABS
1.0000 | ORAL_TABLET | ORAL | Status: DC | PRN
  Administered 2015-09-13 (×2): 1 via ORAL
  Filled 2015-09-12 (×2): qty 1

## 2015-09-12 MED ORDER — TAMSULOSIN HCL 0.4 MG PO CAPS
0.4000 mg | ORAL_CAPSULE | Freq: Every day | ORAL | Status: DC
  Administered 2015-09-14: 0.4 mg via ORAL
  Filled 2015-09-12 (×2): qty 1

## 2015-09-12 MED ORDER — INSULIN ASPART 100 UNIT/ML ~~LOC~~ SOLN: 0.0000 [IU] | Freq: Every day | SUBCUTANEOUS | Status: DC

## 2015-09-12 MED ORDER — CARVEDILOL 3.125 MG PO TABS
3.1250 mg | ORAL_TABLET | Freq: Every day | ORAL | Status: DC
  Administered 2015-09-12 – 2015-09-14 (×3): 3.125 mg via ORAL
  Filled 2015-09-12 (×3): qty 1

## 2015-09-12 MED ORDER — OXYCODONE-ACETAMINOPHEN 5-325 MG PO TABS
ORAL_TABLET | ORAL | Status: AC
  Administered 2015-09-12: 2
  Filled 2015-09-12: qty 2

## 2015-09-12 MED ORDER — INSULIN ASPART 100 UNIT/ML ~~LOC~~ SOLN: 0.0000 [IU] | Freq: Three times a day (TID) | SUBCUTANEOUS | Status: DC

## 2015-09-12 MED ORDER — ASPIRIN 300 MG RE SUPP
300.0000 mg | RECTAL | Status: AC
  Filled 2015-09-12: qty 1

## 2015-09-12 NOTE — H&P (Signed)
[place] [facility] [place] NAME: Lawrence [name]    MR#:  [id number]  DATE OF BIRTH:  Dec 26, 1955  DATE OF ADMISSION:  09/12/2015  PRIMARY CARE PHYSICIAN: [name], MD   REQUESTING/REFERRING PHYSICIAN:  Schuyler Amor, MD  CHIEF COMPLAINT:   Chief Complaint  Patient presents with  . Chest Pain   chest pain since last night 6 PM.  HISTORY OF PRESENT ILLNESS:  Lawrence [name]  is a 59 y.o. male with a known history of hypertension, diabetes, CAD, COPD and CHF. Patient is started to have chest pain since 6:00 last night, which is on the left side, 8/10, constant and sharp without radiation. Patient has nausea but denies any vomiting or sweating or palpitation. He denies any shortness of breath or wheezing. He denies any orthopnea, nocturnal dyspnea but has leg edema. His first troponin level is negative.  PAST MEDICAL HISTORY:   Past Medical History  Diagnosis Date  . CAD (coronary artery disease)     a. 2002 CABGx2 (LIMA->LAD, VG->VG->OM1);  b. 09/2012 DES->OM;  c. 03/2015 PTCA of LAD [facility]) in setting of atretic LIMA; d. 05/2015 Cath [facility]): nonobs dzs; e. 06/2015 Cath ([facility]): LM nl, LAD 45p/d ISR, 50d, D1/2 small, LCX 50p/d ISR, OM1 70ost, 30 ISR, VG->OM1 50ost, 56m LIMA->LAD 99p/d - atretic, RCA dominant, nl.  . Essential hypertension   . COPD (chronic obstructive pulmonary disease) ([place]     a. Chronic bronchitis and emphysema.  . Type II diabetes mellitus [place]   . Asthma   . Celiac disease   . Chronic diastolic CHF (congestive heart failure) ([place]     a. 06/2009 Echo: EF 60-65%, Gr 1 DD, triv AI, mildly dil LA, nl RV.  .Marland KitchenAnxiety   . History of tobacco abuse     a. Quit 2014.  .Marland KitchenPSVT (paroxysmal supraventricular tachycardia) ([place]     a. 10/2012 Noted on Zio Patch.    PAST SURGICAL HISTORY:   Past Surgical History  Procedure Laterality Date  . Vascular surgery    . Bypass graft    . Tonsillectomy    . Cholecystectomy    .  Cardiac catheterization N/A 07/12/2015    rocedure: Left Heart Cath and Cors/Grafts Angiography;  Surgeon: HBelva Crome MD;  [place] [place];  Service: Cardiovascular;  Laterality: N/A;  . Esophageal dilation      SOCIAL HISTORY:   Social History  Substance Use Topics  . Smoking status: Former Smoker    Quit date: 04/22/2013  . Smokeless tobacco: Never Used  . Alcohol Use: No     Comment: remotely quit alcohol use. Hx of heavy alcohol use.    FAMILY HISTORY:   Family History  Problem Relation Age of Onset  . Heart attack Mother   . Cirrhosis Mother   . Diabetes Mother   . Depression Mother   . Heart disease Mother   . Heart attack Father   . Diabetes Father   . Depression Father   . Heart disease Father   . Parkinson's disease Brother     DRUG ALLERGIES:   Allergies  Allergen Reactions  . Albuterol Sulfate [Albuterol] Other (See Comments)    Speeds heart rate up, but Xopenex wasn't effective so MD changed to Albuterol even though it causes palpatations  . Demerol [Meperidine] Hives  . Sulfa Antibiotics Hives  . Morphine Sulfate Rash    Nausea and vomiting    REVIEW OF SYSTEMS:  CONSTITUTIONAL: No fever, fatigue or weakness.  EYES: No blurred or double vision.  EARS, NOSE, AND THROAT: No tinnitus or ear pain.  RESPIRATORY: No cough, shortness of breath, wheezing or hemoptysis.  CARDIOVASCULAR: Has chest pain,no orthopnea, but has leg edema.  GASTROINTESTINAL: Has nausea, no vomiting, diarrhea or abdominal pain.  GENITOURINARY: No dysuria, hematuria.  ENDOCRINE: No polyuria, nocturia,  HEMATOLOGY: No anemia, easy bruising or bleeding SKIN: No rash or lesion. MUSCULOSKELETAL: No joint pain or arthritis.   NEUROLOGIC: No tingling, numbness, weakness.  PSYCHIATRY: No anxiety or depression.   MEDICATIONS AT HOME:   Prior to Admission medications   Medication Sig Start Date End Date Taking? Authorizing Provider  albuterol (PROVENTIL HFA;VENTOLIN  HFA) 108 (90 BASE) MCG/ACT inhaler Inhale 2 puffs into the lungs every 6 (six) hours as needed for wheezing. 07/03/15  Yes Birdie Sons, MD  ALPRAZolam Duanne Moron) 1 MG tablet TAKE 1/2-1 TABLET BY MOUTH 3 TIMES A DAY Patient taking differently: 1 TABLET BY MOUTH 3 TIMES DAILY 05/22/15  Yes Birdie Sons, MD  amLODipine (NORVASC) 5 MG tablet Take 5 mg by mouth daily.  08/06/15  Yes Historical Provider, MD  aspirin EC 81 MG tablet Take 81 mg by mouth every morning.   Yes Historical Provider, MD  atorvastatin (LIPITOR) 80 MG tablet Take 1 tablet (80 mg total) by mouth at bedtime. 04/25/15  Yes Birdie Sons, MD  carvedilol (COREG) 3.125 MG tablet Take 3.125 mg by mouth daily.   Yes Historical Provider, MD  cetirizine (ZYRTEC) 10 MG tablet Take 10 mg by mouth at bedtime.    Yes Historical Provider, MD  clopidogrel (PLAVIX) 75 MG tablet TAKE ONE (1) TABLET EACH DAY 04/22/15  Yes Birdie Sons, MD  furosemide (LASIX) 40 MG tablet Take 1 tablet (40 mg total) by mouth 2 (two) times daily. Patient taking differently: Take 40 mg by mouth daily.  08/02/15  Yes Loletha Grayer, MD  isosorbide mononitrate (IMDUR) 60 MG 24 hr tablet Take 120 mg by mouth daily.  06/12/15  Yes Historical Provider, MD  lansoprazole (PREVACID) 30 MG capsule Take 30 mg by mouth every morning.  06/17/15  Yes Historical Provider, MD  lisinopril (PRINIVIL,ZESTRIL) 20 MG tablet Take 20 mg by mouth daily.   Yes Historical Provider, MD  metFORMIN (GLUCOPHAGE) 500 MG tablet Take 1 tablet (500 mg total) by mouth every morning. 04/25/15  Yes Birdie Sons, MD  nitroGLYCERIN (NITROSTAT) 0.4 MG SL tablet Place 1 tablet (0.4 mg total) under the tongue every 5 (five) minutes as needed for chest pain. Every 5 minutes as needed for chest pain 04/26/15  Yes Birdie Sons, MD  omega-3 acid ethyl esters (LOVAZA) 1 G capsule TAKE FOUR CAPSULES BY MOUTH DAILY 07/01/15  Yes Birdie Sons, MD  oxyCODONE-acetaminophen (PERCOCET) 10-325 MG tablet Take 1 tablet by  mouth every 4 (four) hours as needed for pain. Patient taking differently: Take 1 tablet by mouth every 4 (four) hours.  09/02/15  Yes Birdie Sons, MD  potassium chloride (K-DUR,KLOR-CON) 10 MEQ tablet Take 1 tablet (10 mEq total) by mouth 2 (two) times daily. Patient taking differently: Take 10 mEq by mouth daily.  08/02/15  Yes Loletha Grayer, MD  tamsulosin (FLOMAX) 0.4 MG CAPS capsule Take 0.4 mg by mouth daily.   Yes Historical Provider, MD  tiotropium (SPIRIVA) 18 MCG inhalation capsule Place 18 mcg into inhaler and inhale daily.   Yes Historical Provider, MD  ranolazine (RANEXA) 500 MG 12 hr  tablet Take 1 tablet (500 mg total) by mouth 2 (two) times daily. Patient not taking: Reported on 09/12/2015 08/02/15   Loletha Grayer, MD      VITAL SIGNS:  Blood pressure 137/77, pulse 89, temperature 97.6 F (36.4 C), temperature source Oral, resp. rate 18, height 5' 7"  (1.702 m), weight 104.781 kg (231 lb), SpO2 94 %.  PHYSICAL EXAMINATION:  GENERAL:  59 y.o.-year-old patient lying in the bed with no acute distress. Obese. EYES: Pupils equal, round, reactive to light and accommodation. No scleral icterus. Extraocular muscles intact.  HEENT: Head atraumatic, normocephalic. Oropharynx and nasopharynx clear. Moist oral mucosa. NECK:  Supple, no jugular venous distention. No thyroid enlargement, no tenderness.  LUNGS: Diminished breath sounds bilaterally, no wheezing, rales,rhonchi or crepitation. No use of accessory muscles of respiration.  CARDIOVASCULAR: S1, S2 normal. No murmurs, rubs, or gallops.  ABDOMEN: Soft, nontender, nondistended. Bowel sounds present. No organomegaly or mass.  EXTREMITIES: No pedal edema, cyanosis, or clubbing.  NEUROLOGIC: Cranial nerves II through XII are intact. Muscle strength 5/5 in all extremities. Sensation intact. Gait not checked.  PSYCHIATRIC: The patient is alert and oriented x 3.  SKIN: No obvious rash, lesion, or ulcer.   LABORATORY PANEL:    CBC  Recent Labs Lab 09/12/15 1801  WBC 7.4  HGB 14.3  HCT 43.5  PLT 226   ------------------------------------------------------------------------------------------------------------------  Chemistries   Recent Labs Lab 09/12/15 1801  NA 142  K 4.1  CL 103  CO2 31  GLUCOSE 112*  BUN 15  CREATININE 1.00  CALCIUM 9.7  AST 21  ALT 22  ALKPHOS 67  BILITOT 0.7   ------------------------------------------------------------------------------------------------------------------  Cardiac Enzymes  Recent Labs Lab 09/12/15 1801  TROPONINI <0.03   ------------------------------------------------------------------------------------------------------------------  RADIOLOGY:  Dg Chest 2 View  09/12/2015  CLINICAL DATA:  59 year old male with lower extremity swelling for the past 24 hours. Left leg numbness. Chest pain intermittently with some shortness breath today. EXAM: CHEST  2 VIEW COMPARISON:  Chest x-ray 07/31/2015. FINDINGS: Lung volumes are normal. No consolidative airspace disease. No pleural effusions. No pneumothorax. No pulmonary nodule or mass noted. Pulmonary vasculature and the cardiomediastinal silhouette are within normal limits. Atherosclerosis in the thoracic aorta. Status post median sternotomy for CABG. Multiple coronary artery stents are noted. IMPRESSION: 1. No radiographic evidence of acute cardiopulmonary disease. 2. Atherosclerosis. Electronically Signed   By: Vinnie Langton M.D.   On: 09/12/2015 18:03    EKG:   Orders placed or performed during the hospital encounter of 09/12/15  . ED EKG within 10 minutes  . ED EKG within 10 minutes  . EKG 12-Lead  . EKG 12-Lead  . ED EKG  . ED EKG    IMPRESSION AND PLAN:   Chest pain, rule out ACS. CAD with recent stent placement. Hypertension Diabetes COPD Chronic diastolic CHF  The patient will be placed for observation. Continue telemetry monitor, follow-up troponin level. Continue aspirin,  Plavix, Lipitor, Coreg, lisinopril and Lasix. Give nitroglycerin when necessary. I will get cardiology consult from Dr. [name] for further recommendation.  For diabetes, start sliding scale. COPD, stable, continue nebulizer treatment.  All the records are reviewed and case discussed with ED provider. Management plans discussed with the patient, family and they are in agreement.  CODE STATUS: Full code  TOTAL TIME TAKING CARE OF THIS PATIENT: 53 minutes.    Demetrios Loll M.D on 09/12/2015 at 8:48 PM  Between 7am to 6pm - Pager - [phone]  After 6pm go to [email or web address] - password  [facility]  [place] [facility]  Office  [phone]  CC: Primary care physician; [name], MD

## 2015-09-12 NOTE — ED Provider Notes (Addendum)
[facility] Emergency Department Provider Note  ____________________________________________   I have reviewed the triage vital signs and the nursing notes.   HISTORY  Chief Complaint Chest Pain    HPI Lawrence [name] is a 59 y.o. male with a history of CHF, significant coronary artery disease history with a recent stent placed at [facility], finger tenderness in the past presents today with chest pressure which she describes as dull and constant since last night. He has also had dyspnea and exertion and nausea. No diaphoresis. He has had gradual worsening leg swelling over the last few days as well. He denies any fever or chills. He states is compliant with his medication. He has COPD, quit smoking apparently 2 years ago, has a chronic smoker's cough at that is not changed. No fever or mucus production. He describes the pain as constant since last night. He does say that it feels similar to prior heart attacks but also feels a little bit similar to his prior pancreatitis.States is a little bit worse when he walks around, it does not completely go away when he lies flat. He states when he gets this kind of pain usually the only thing that makes it better his morphine. He is on Imdur and it does not "touch it". He has not taking cialis or Viagra. He does state he also has some exertional dyspnea which is new for him.  Past Medical History  Diagnosis Date  . CAD (coronary artery disease)     a. 2002 CABGx2 (LIMA->LAD, VG->VG->OM1);  b. 09/2012 DES->OM;  c. 03/2015 PTCA of LAD [facility]) in setting of atretic LIMA; d. 05/2015 Cath [facility]): nonobs dzs; e. 06/2015 Cath ([facility]): LM nl, LAD 45p/d ISR, 50d, D1/2 small, LCX 50p/d ISR, OM1 70ost, 30 ISR, VG->OM1 50ost, 39m LIMA->LAD 99p/d - atretic, RCA dominant, nl.  . Essential hypertension   . COPD (chronic obstructive pulmonary disease) ([place]     a. Chronic bronchitis and emphysema.  . Type II diabetes mellitus ([facility]   . Asthma   . Celiac  disease   . Chronic diastolic CHF (congestive heart failure) ([facility]     a. 06/2009 Echo: EF 60-65%, Gr 1 DD, triv AI, mildly dil LA, nl RV.  .Marland KitchenAnxiety   . History of tobacco abuse     a. Quit 2014.  .Marland KitchenPSVT (paroxysmal supraventricular tachycardia) ([facility]     a. 10/2012 Noted on Zio Patch.    Patient Active Problem List   Diagnosis Date Noted  . Nocturnal hypoxia 09/06/2015  . BPH (benign prostatic hyperplasia) 08/01/2015  . Essential hypertension   . Chronic diastolic CHF (congestive heart failure) ([facility]   . Angina at rest ([facility]   . Pain in the chest   . Unstable angina ([facility] 07/11/2015  . Chest pain 07/11/2015  . COPD (chronic obstructive pulmonary disease) ([place] 07/03/2015  . COPD exacerbation ([place] 06/26/2015  . CAD (coronary artery disease) 06/26/2015  . HTN (hypertension) 06/26/2015  . DM (diabetes mellitus) ([place] 06/26/2015  . Achalasia 07/24/2014  . GERD (gastroesophageal reflux disease) 06/07/2014  . Former tobacco use 04/11/2013  . Chest pain at rest 04/09/2013  . HLD (hyperlipidemia) 04/09/2013    Past Surgical History  Procedure Laterality Date  . Vascular surgery    . Bypass graft    . Tonsillectomy    . Cholecystectomy    . Cardiac catheterization N/A 07/12/2015    rocedure: Left Heart Cath and Cors/Grafts Angiography;  Surgeon: HBelva Crome MD;  Location:  [place] INVASIVE CV LAB;  Service: Cardiovascular;  Laterality: N/A;  . Esophageal dilation      Current Outpatient Rx  Name  Route  Sig  Dispense  Refill  . albuterol (PROVENTIL HFA;VENTOLIN HFA) 108 (90 BASE) MCG/ACT inhaler   Inhalation   Inhale 2 puffs into the lungs every 6 (six) hours as needed for wheezing.   1 Inhaler   0   . ALPRAZolam (XANAX) 1 MG tablet      TAKE 1/2-1 TABLET BY MOUTH 3 TIMES A DAY Patient taking differently: 1 TABLET BY MOUTH 3 TIMES DAILY   90 tablet   3   . amLODipine (NORVASC) 5 MG tablet   Oral   Take 5 mg by mouth daily.          Marland Kitchen aspirin EC 81 MG tablet    Oral   Take 81 mg by mouth every morning.         Marland Kitchen atorvastatin (LIPITOR) 80 MG tablet   Oral   Take 1 tablet (80 mg total) by mouth at bedtime.   90 tablet   3   . carvedilol (COREG) 3.125 MG tablet   Oral   Take 3.125 mg by mouth daily.         . cetirizine (ZYRTEC) 10 MG tablet   Oral   Take 10 mg by mouth at bedtime.          . clopidogrel (PLAVIX) 75 MG tablet      TAKE ONE (1) TABLET EACH DAY   30 tablet   5   . furosemide (LASIX) 40 MG tablet   Oral   Take 1 tablet (40 mg total) by mouth 2 (two) times daily. Patient taking differently: Take 40 mg by mouth daily.    60 tablet   0   . isosorbide mononitrate (IMDUR) 60 MG 24 hr tablet   Oral   Take 120 mg by mouth daily.          . lansoprazole (PREVACID) 30 MG capsule   Oral   Take 30 mg by mouth every morning.          Marland Kitchen lisinopril (PRINIVIL,ZESTRIL) 20 MG tablet   Oral   Take 20 mg by mouth daily.         . metFORMIN (GLUCOPHAGE) 500 MG tablet   Oral   Take 1 tablet (500 mg total) by mouth every morning.   90 tablet   4   . nitroGLYCERIN (NITROSTAT) 0.4 MG SL tablet   Sublingual   Place 1 tablet (0.4 mg total) under the tongue every 5 (five) minutes as needed for chest pain. Every 5 minutes as needed for chest pain   30 tablet   1   . omega-3 acid ethyl esters (LOVAZA) 1 G capsule      TAKE FOUR CAPSULES BY MOUTH DAILY   120 capsule   12   . oxyCODONE-acetaminophen (PERCOCET) 10-325 MG tablet   Oral   Take 1 tablet by mouth every 4 (four) hours as needed for pain. Patient taking differently: Take 1 tablet by mouth every 4 (four) hours.    150 tablet   0   . potassium chloride (K-DUR,KLOR-CON) 10 MEQ tablet   Oral   Take 1 tablet (10 mEq total) by mouth 2 (two) times daily. Patient taking differently: Take 10 mEq by mouth daily.    60 tablet   0   . tamsulosin (FLOMAX) 0.4 MG CAPS capsule   Oral  Take 0.4 mg by mouth daily.         Marland Kitchen tiotropium (SPIRIVA) 18 MCG  inhalation capsule   Inhalation   Place 18 mcg into inhaler and inhale daily.         . ranolazine (RANEXA) 500 MG 12 hr tablet   Oral   Take 1 tablet (500 mg total) by mouth 2 (two) times daily. Patient not taking: Reported on 09/12/2015   60 tablet   0     Allergies Albuterol sulfate; Demerol; Sulfa antibiotics; and Morphine sulfate  Family History  Problem Relation Age of Onset  . Heart attack Mother   . Cirrhosis Mother   . Diabetes Mother   . Depression Mother   . Heart disease Mother   . Heart attack Father   . Diabetes Father   . Depression Father   . Heart disease Father   . Parkinson's disease Brother     Social History Social History  Substance Use Topics  . Smoking status: Former Smoker    Quit date: 04/22/2013  . Smokeless tobacco: Never Used  . Alcohol Use: No     Comment: remotely quit alcohol use. Hx of heavy alcohol use.    Review of Systems Constitutional: No fever/chills Eyes: No visual changes. ENT: No sore throat. No stiff neck no neck pain Cardiovascular: chest pain. Respiratory: Positive shortness of breath. Gastrointestinal:   no vomiting.  No diarrhea.  No constipation. Genitourinary: Negative for dysuria. Musculoskeletal: Negative lower extremity swelling Skin: Negative for rash. Neurological: Negative for headaches, focal weakness or numbness. 10-point ROS otherwise negative.  ____________________________________________   PHYSICAL EXAM:  VITAL SIGNS: ED Triage Vitals  Enc Vitals Group     BP 09/12/15 1735 137/77 mmHg     Pulse Rate 09/12/15 1735 89     Resp 09/12/15 1735 18     Temp 09/12/15 1735 97.6 F (36.4 C)     Temp Source 09/12/15 1735 Oral     SpO2 09/12/15 1735 94 %     Weight 09/12/15 1735 231 lb (104.781 kg)     Height 09/12/15 1735 5' 7"  (1.702 m)     Head Cir --      Peak Flow --      Pain Score 09/12/15 1736 9     Pain Loc --      Pain Edu? --      Excl. in [place]? --     Constitutional: Alert and  oriented. Well appearing and in no acute distress. Eyes: Conjunctivae are normal. PERRL. EOMI. Head: Atraumatic. Nose: No congestion/rhinnorhea. Mouth/Throat: Mucous membranes are moist.  Oropharynx non-erythematous. Neck: No stridor.   Nontender with no meningismus Cardiovascular: Normal rate, regular rhythm. Grossly normal heart sounds.  Good peripheral circulation. Respiratory: Normal respiratory effort.  No retractions. Lungs CTAB. Gastrointestinal: Soft and nontender. No distention. No guarding no rebound Back:  There is no focal tenderness or step off there is no midline tenderness there are no lesions noted. there is no CVA tenderness Musculoskeletal: No lower extremity tenderness. No joint effusions, no DVT signs strong distal pulses bilateral mild symmetric pitting 1+ edema Neurologic:  Normal speech and language. No gross focal neurologic deficits are appreciated.  Skin:  Skin is warm, dry and intact. No rash noted. Psychiatric: Mood and affect are normal. Speech and behavior are normal.  ____________________________________________   LABS (all labs ordered are listed, but only abnormal results are displayed)  Labs Reviewed  BASIC METABOLIC PANEL - Abnormal; Notable for the  following:    Glucose, Bld 112 (*)    All other components within normal limits  CBC - Abnormal; Notable for the following:    RDW 14.9 (*)    All other components within normal limits  HEPATIC FUNCTION PANEL - Abnormal; Notable for the following:    Bilirubin, Direct <0.1 (*)    All other components within normal limits  TROPONIN I  BRAIN NATRIURETIC PEPTIDE  LIPASE, BLOOD  PROTIME-INR  APTT   ____________________________________________  EKG  I Personally interpreted EKG, some baseline artifact limits interpretation, normal sinus rhythm rate 86 bpm no acute ST elevation or acute ST depression normal axis, unremarkable EKG for acute ischemia  I personally reviewed EKG, normal sinus rhythm, rate  75 bpm no acute ST elevation or acute ST depression normal axis, possible old anterior infarct no acute ischemia ____________________________________________  RADIOLOGY  I have reviewed radiology findings ____________________________________________   PROCEDURES  Procedure(s) performed: None  Critical Care performed: None  ____________________________________________   INITIAL IMPRESSION / ASSESSMENT AND PLAN / ED COURSE  Pertinent labs & imaging results that were available during my care of the patient were reviewed by me and considered in my medical decision making (see chart for details).  Patient with somewhat atypical chest pain in terms of its duration, but otherwise concerning for ACS. He has had pain since last night worse with exertion. Low suspicion for PE or dissection. He has no personal or family history of PE or DVT no recent travel no unilateral leg swelling no recent surgery and this pain is not pleuritic. The pain is however evocative of his prior ACS symptoms. Patient will need to be admitted to the hospital. Initial troponin is reassuringly negative despite pain for nearly 24 hours. We will admit him to the hospital for further observation given recent stent placement. No evidence of pancreatitis or liver function problems and serial abdominal exams are benign. I do not think the patient has a aneurysm, myocarditis endocrine is pericarditis dissection or other acute intrathoracic or intra-abdominal pathology at this time. However he will need continued observation and further workup ____________________________________________  ----------------------------------------- 7:25 PM on 09/12/2015 -----------------------------------------  At this time, the patient's pain is much improved after some morphine and nitroglycerin but he states it is still present. He is in no acute distress. BNP is negative. We will continue to evaluate him. I'm going to repeat EKG given ongoing  discomfort. Repeat EKG does not show any evidence of acute ischemia   FINAL CLINICAL IMPRESSION(S) / ED DIAGNOSES  Final diagnoses:  None     Schuyler Amor, MD 09/12/15 [place], MD 09/12/15 [phone]

## 2015-09-12 NOTE — ED Notes (Signed)
Pt reports that he began having central chest pressure yesterday, pt also reports that he has been having some swelling in BLE. Pt states that he has had a MI in the past with bypass and stent placement.

## 2015-09-12 NOTE — ED Notes (Signed)
Pt c/o central chest pressure since yesterday. States the pain is similar to prior MI. Also c/o SOB upon exertion. Pt alert & oriented with warm, dry skin.

## 2015-09-13 ENCOUNTER — Encounter: Payer: Self-pay | Admitting: Physician Assistant

## 2015-09-13 ENCOUNTER — Encounter (HOSPITAL_BASED_OUTPATIENT_CLINIC_OR_DEPARTMENT_OTHER): Payer: Medicare PPO

## 2015-09-13 DIAGNOSIS — I200 Unstable angina: Secondary | ICD-10-CM

## 2015-09-13 DIAGNOSIS — R079 Chest pain, unspecified: Secondary | ICD-10-CM

## 2015-09-13 LAB — GLUCOSE, CAPILLARY
Glucose-Capillary: 102 mg/dL — ABNORMAL HIGH (ref 65–99)
Glucose-Capillary: 82 mg/dL (ref 65–99)
Glucose-Capillary: 118 mg/dL — ABNORMAL HIGH (ref 65–99)
Glucose-Capillary: 130 mg/dL — ABNORMAL HIGH (ref 65–99)

## 2015-09-13 LAB — BASIC METABOLIC PANEL
Anion gap: 5 (ref 5–15)
BUN: 19 mg/dL (ref 6–20)
CO2: 31 mmol/L (ref 22–32)
Calcium: 8.9 mg/dL (ref 8.9–10.3)
Chloride: 105 mmol/L (ref 101–111)
Creatinine, Ser: 0.95 mg/dL (ref 0.61–1.24)
GFR calc Af Amer: >60 mL/min (ref >60)
GFR calc non Af Amer: >60 mL/min (ref >60)
Glucose, Bld: 114 mg/dL — ABNORMAL HIGH (ref 65–99)
Potassium: 3.8 mmol/L (ref 3.5–5.1)
Sodium: 141 mmol/L (ref 135–145)

## 2015-09-13 LAB — NM MYOCAR MULTI W/SPECT W/WALL MOTION / EF
LV dias vol: 85 mL
LV sys vol: 32 mL
Peak HR: 86 {beats}/min
Percent HR: 54 %
Rest HR: 59 {beats}/min
SDS: 1
SRS: 7
SSS: 6
TID: 0.91

## 2015-09-13 LAB — TROPONIN I
Troponin I: <0.03 ng/mL (ref <0.031)
Troponin I: <0.03 ng/mL (ref <0.031)

## 2015-09-13 LAB — HEMOGLOBIN A1C: Hgb A1c MFr Bld: 6.7 % — ABNORMAL HIGH (ref 4.0–6.0)

## 2015-09-13 LAB — LIPID PANEL
Cholesterol: 113 mg/dL (ref 0–200)
HDL: 29 mg/dL — ABNORMAL LOW (ref >40)
LDL Cholesterol: 51 mg/dL (ref 0–99)
Total CHOL/HDL Ratio: 3.9 RATIO
Triglycerides: 164 mg/dL — ABNORMAL HIGH (ref <150)
VLDL: 33 mg/dL (ref 0–40)

## 2015-09-13 MED ORDER — TECHNETIUM TC 99M SESTAMIBI - CARDIOLITE
10.0000 | Freq: Once | INTRAVENOUS | Status: AC | PRN
  Administered 2015-09-13: 12.56 via INTRAVENOUS

## 2015-09-13 MED ORDER — TECHNETIUM TC 99M SESTAMIBI - CARDIOLITE
32.0450 | Freq: Once | INTRAVENOUS | Status: AC | PRN
  Administered 2015-09-13: 32.045 via INTRAVENOUS

## 2015-09-13 MED ORDER — RANOLAZINE ER 1000 MG PO TB12: 1000.0000 mg | ORAL_TABLET | Freq: Two times a day (BID) | ORAL | Status: DC

## 2015-09-13 MED ORDER — REGADENOSON 0.4 MG/5ML IV SOLN
0.4000 mg | Freq: Once | INTRAVENOUS | Status: AC
  Administered 2015-09-13: 0.4 mg via INTRAVENOUS

## 2015-09-13 NOTE — Progress Notes (Signed)
[place] [facility] [place] NAME: Lawrence [name]    MR#:  [id number]  DATE OF BIRTH:  1956/08/26  SUBJECTIVE:  CHIEF COMPLAINT:   Chief Complaint  Patient presents with  . Chest Pain   - Still complaining of exertional chest pain up to 6/10 intensity. -Recent cardiac catheterization at [facility] 3 weeks ago with a stent in LAD. -Also complains of left leg sciatica pain  REVIEW OF SYSTEMS:  Review of Systems  Constitutional: Negative for fever, chills, weight loss and malaise/fatigue.  HENT: Negative for ear discharge, ear pain, hearing loss, nosebleeds and tinnitus.   Eyes: Negative for blurred vision, double vision and photophobia.  Respiratory: Positive for shortness of breath. Negative for cough, hemoptysis and wheezing.   Cardiovascular: Positive for chest pain and leg swelling. Negative for palpitations and orthopnea.  Gastrointestinal: Negative for heartburn, nausea, vomiting, abdominal pain, diarrhea, constipation and melena.  Genitourinary: Negative for dysuria, urgency, frequency and hematuria.  Musculoskeletal: Negative for myalgias, back pain and neck pain.       Leg pain- left leg sciatica  Skin: Negative for rash.  Neurological: Negative for dizziness, tingling, tremors, sensory change, speech change, focal weakness and headaches.  Endo/Heme/Allergies: Does not bruise/bleed easily.  Psychiatric/Behavioral: Negative for depression.    DRUG ALLERGIES:   Allergies  Allergen Reactions  . Albuterol Sulfate [Albuterol] Other (See Comments)    Speeds heart rate up, but Xopenex wasn't effective so MD changed to Albuterol even though it causes palpatations  . Demerol [Meperidine] Hives  . Sulfa Antibiotics Hives  . Morphine Sulfate Rash    Nausea and vomiting    VITALS:  Blood pressure 118/72, pulse 52, temperature 97.7 F (36.5 C), temperature source Oral, resp. rate 18, height 5' 7"  (1.702 m), weight 102.468 kg (225 lb 14.4  oz), SpO2 89 %.  PHYSICAL EXAMINATION:  Physical Exam  GENERAL:  59 y.o.-year-old patient lying in the bed with no acute distress.  EYES: Pupils equal, round, reactive to light and accommodation. No scleral icterus. Extraocular muscles intact.  HEENT: Head atraumatic, normocephalic. Oropharynx and nasopharynx clear.  NECK:  Supple, no jugular venous distention. No thyroid enlargement, no tenderness.  LUNGS: Normal breath sounds bilaterally, no wheezing, rales,rhonchi or crepitation. No use of accessory muscles of respiration.  CARDIOVASCULAR: S1, S2 normal. No rubs, or gallops. 3/6 systolic murmur, no chest wall tenderness ABDOMEN: Soft, nontender, nondistended. Bowel sounds present. No organomegaly or mass.  EXTREMITIES: 1-2+ pedal edema noted.  No cyanosis, or clubbing.  NEUROLOGIC: Cranial nerves II through XII are intact. Muscle strength 5/5 in all extremities. Sensation intact. Gait not checked.  PSYCHIATRIC: The patient is alert and oriented x 3.  SKIN: No obvious rash, lesion, or ulcer.    LABORATORY PANEL:   CBC  Recent Labs Lab 09/12/15 1801  WBC 7.4  HGB 14.3  HCT 43.5  PLT 226   ------------------------------------------------------------------------------------------------------------------  Chemistries   Recent Labs Lab 09/12/15 1801 09/13/15 0347  NA 142 141  K 4.1 3.8  CL 103 105  CO2 31 31  GLUCOSE 112* 114*  BUN 15 19  CREATININE 1.00 0.95  CALCIUM 9.7 8.9  AST 21  --   ALT 22  --   ALKPHOS 67  --   BILITOT 0.7  --    ------------------------------------------------------------------------------------------------------------------  Cardiac Enzymes  Recent Labs Lab 09/13/15 0936  TROPONINI <0.03   ------------------------------------------------------------------------------------------------------------------  RADIOLOGY:  Dg Chest 2 View  09/12/2015  CLINICAL DATA:  59 year old male  with lower extremity swelling for the past 24 hours.  Left leg numbness. Chest pain intermittently with some shortness breath today. EXAM: CHEST  2 VIEW COMPARISON:  Chest x-ray 07/31/2015. FINDINGS: Lung volumes are normal. No consolidative airspace disease. No pleural effusions. No pneumothorax. No pulmonary nodule or mass noted. Pulmonary vasculature and the cardiomediastinal silhouette are within normal limits. Atherosclerosis in the thoracic aorta. Status post median sternotomy for CABG. Multiple coronary artery stents are noted. IMPRESSION: 1. No radiographic evidence of acute cardiopulmonary disease. 2. Atherosclerosis. Electronically Signed   By: Vinnie Langton M.D.   On: 09/12/2015 18:03    EKG:   Orders placed or performed during the hospital encounter of 09/12/15  . ED EKG within 10 minutes  . ED EKG within 10 minutes  . EKG 12-Lead  . EKG 12-Lead  . ED EKG  . ED EKG    ASSESSMENT AND PLAN:   59 year old male with significant cardiac history including coronary artery disease status post CABG twice, multiple interventions and cardiac catheterizations, most recent one in October 2016 with a drug-eluting stent to LAD, COPD, hypertension, anxiety and diabetes mellitus presents to the hospital secondary to chest pain.  #1 unstable angina-with prior significant cardiac history including CABG, multiple stents, most recent one on 08/21/2015 at [facility] to LAD. -Ruled out for MI. Troponins 3 are negative -Appreciate cardiology consult. -Patient already on optimal medical management. -For Myoview today. If negative, will increase his Ranexa and discharge home. -If positive, will need repeat cardiac catheterization. -Continue all his cardiac medications.  #2 coronary artery disease status post CABG in stents, most recent stent 3 weeks ago to LAD- -GU aspirin, Plavix and Coreg -Also on statin and lisinopril.  #3 congestive heart failure-acute on chronic systolic CHF Ejection fraction is 60%. -extra of Lasix will be given after stress test  today for his pedal edema. Chest x-ray with no acute pulmonary edema. -Continue his outpatient dose of Lasix  #4 hypertension-continue all home medications  #5 COPD-stable. Continue home inhalers. -No indication for a systemic steroids  #6 DVT prophylaxis-on Lovenox   All the records are reviewed and case discussed with Care Management/Social Workerr. Management plans discussed with the patient, family and they are in agreement.  CODE STATUS: Full Code  TOTAL TIME TAKING CARE OF THIS PATIENT: 36 minutes.   POSSIBLE D/C IN 1 DAY, DEPENDING ON CLINICAL CONDITION.   Simya Tercero M.D on 09/13/2015 at 12:45 PM  Between 7am to 6pm - Pager - [phone]  After 6pm go to [email or web address] - password [email or web address] [place] [facility]  Office  [phone]  CC: Primary care physician; [name], MD

## 2015-09-13 NOTE — Discharge Instructions (Signed)
PLEASE FOLLOW A SODIUM AND FLUID RESTRICTION DIET

## 2015-09-13 NOTE — Plan of Care (Signed)
Problem: Consults Goal: Chest Pain Patient Education (See Patient Education module for education specifics.) Outcome: Not Progressing Patient has been c/o chest pain 7/10. Medication given as ordered.

## 2015-09-13 NOTE — Consult Note (Signed)
Cardiology Consultation Note  Patient ID: Lawrence [name], MRN: [id number], DOB/AGE: February 18, 1956 59 y.o. Admit date: 09/12/2015   Date of Consult: 09/13/2015 Primary Physician: [name], MD Primary Cardiologist: Dr. Gennette Pac, MD  Chief Complaint: Chest pain Reason for Consult: Chest pain  HPI: 59 y.o. male with h/o CAD s/p CABG x 2 in 2002 with multiple interventions and caths since then, most recently on 08/21/2015 with PCI/DES to mid LAD, who presented to [facility] on 10/27 with recurrent chest pain.    He has a h/o CAD and has been followed closely @ [facility]. He is s/p CABG x 2 following anterior STEMI and LAD dissection in 2002. He has subsequently undergone multiple caths and DES to the OM in 09/2012, and PTCA of the LAD at the site of the LIMA anastamosis in 03/2015 (in the setting of an atretic LIMA). He underwent relook caths in the setting of recurrent and somewhat constant c/p in 05/2015 [facility] - nonobs dzs) and 06/2015 ([facility] - nonobs dzs) and was medically managed. He saw his primary cardiologist @ [facility] in early Sept and cont med Rx was recommended. He was readmitted to [facility] on 9/9 with the same left lower sternal chest pain and apparently seen by cardiology who felt that pain was non-cardiac. CT of the abd showed no PE, a healing left 4th rib fx, emphysema, and asymmetric thickening of the distal esophagus and he was subsequently discharged the following day by internal medicine. He was admitted to [facility] 9/14-9/16 with same recurrent left sided chest pain and was continued on medical therapy, with the addition of Ranexa, as he ruled out. He was seen by [facility] on 10/4 and was sent to the ED given ongoing chest pain. He underwent cardiac cath on 08/21/2015 that showed 40-50% stenosis of the proximal LAD (FFR 0.90), 75% stenosis of mid LAD (FFR 0.77) s/p successful PCI/DES with Xience drug eluting stent, 40% ostial RCA with FFR 0.95. Mildly decreased LV systolic EF. He was continued on aspirin  and Plavix. P2Y12 was checked, though I do not have the results for this test in Epic. He called [facility] on 10/27 with complaints of swollen feet and numbeness in his hip. He followed up with his PCP who did not seem concerned.   He presented to [facility] on 10/27 with one day of exertional chest pain and SOB, rated 9/10 with mild associated lower extremity swelling. Chest pain is similar to his prior chest pain episodes. No associated nausea, vomiting, diaphoresis, presyncope, or syncope. He reports limiting his salt intake, and drinking 4-5 eight oz glasses of water daily. He has not missed any doses of his aspirin or Plavix since PCI.   Upon the patient's arrival to [facility] they were found to have troponin negative x 3, BNP 20, lipase 27, WBC 7.4, hgb 14.3, SCr 1.00, K+ 4.1-->3.8, A1C 6.7%, LDL 51 . ECG showed NSR, 86 bpm, inferior artifact, no significant st/t changes, CXR showed no acute cardiopulmonary pathology, with atherosclerosis. Currently, he notes mild chest pain that has not improved with "oxycodone."     Past Medical History  Diagnosis Date  . CAD (coronary artery disease)     a. 2002 CABGx2 (LIMA->LAD, VG->VG->OM1);  b. 09/2012 DES->OM;  c. 03/2015 PTCA of LAD [facility]) in setting of atretic LIMA; d. 05/2015 Cath [facility]): nonobs dzs; e. 06/2015 Cath ([facility]): LM nl, LAD 45p/d ISR, 50d, D1/2 small, LCX 50p/d ISR, OM1 70ost, 30 ISR, VG->OM1 50ost, 28m LIMA->LAD 99p/d - atretic, RCA dom,  nl; f.cath 10/16: 40-50%(FFR 0.90) pLAD, 75% (FFR 0.77) mLAD s/p PCI/DES, oRCA 40% (FFR0.95)  . Essential hypertension   . COPD (chronic obstructive pulmonary disease) ([place])     a. Chronic bronchitis and emphysema.  . Type II diabetes mellitus ([place])   . Asthma   . Celiac disease   . Chronic diastolic CHF (congestive heart failure) ([place])     a. 06/2009 Echo: EF 60-65%, Gr 1 DD, triv AI, mildly dil LA, nl RV.  Marland Kitchen Anxiety   . History of tobacco abuse     a. Quit 2014.  Marland Kitchen PSVT (paroxysmal supraventricular  tachycardia) ([place])     a. 10/2012 Noted on Zio Patch.      Most Recent Cardiac Studies: As above   Surgical History:  Past Surgical History  Procedure Laterality Date  . Vascular surgery    . Bypass graft    . Tonsillectomy    . Cholecystectomy    . Cardiac catheterization N/A 07/12/2015    rocedure: Left Heart Cath and Cors/Grafts Angiography;  Surgeon: Belva Crome, MD;  Location: Echo CV LAB;  Service: Cardiovascular;  Laterality: N/A;  . Esophageal dilation       Home Meds: Prior to Admission medications   Medication Sig Start Date End Date Taking? Authorizing Provider  albuterol (PROVENTIL HFA;VENTOLIN HFA) 108 (90 BASE) MCG/ACT inhaler Inhale 2 puffs into the lungs every 6 (six) hours as needed for wheezing. 07/03/15  Yes Birdie Sons, MD  ALPRAZolam Duanne Moron) 1 MG tablet TAKE 1/2-1 TABLET BY MOUTH 3 TIMES A DAY Patient taking differently: 1 TABLET BY MOUTH 3 TIMES DAILY 05/22/15  Yes Birdie Sons, MD  amLODipine (NORVASC) 5 MG tablet Take 5 mg by mouth daily.  08/06/15  Yes Historical Provider, MD  aspirin EC 81 MG tablet Take 81 mg by mouth every morning.   Yes Historical Provider, MD  atorvastatin (LIPITOR) 80 MG tablet Take 1 tablet (80 mg total) by mouth at bedtime. 04/25/15  Yes Birdie Sons, MD  carvedilol (COREG) 3.125 MG tablet Take 3.125 mg by mouth daily.   Yes Historical Provider, MD  cetirizine (ZYRTEC) 10 MG tablet Take 10 mg by mouth at bedtime.    Yes Historical Provider, MD  clopidogrel (PLAVIX) 75 MG tablet TAKE ONE (1) TABLET EACH DAY 04/22/15  Yes Birdie Sons, MD  furosemide (LASIX) 40 MG tablet Take 1 tablet (40 mg total) by mouth 2 (two) times daily. Patient taking differently: Take 40 mg by mouth daily.  08/02/15  Yes Loletha Grayer, MD  isosorbide mononitrate (IMDUR) 60 MG 24 hr tablet Take 120 mg by mouth daily.  06/12/15  Yes Historical Provider, MD  lansoprazole (PREVACID) 30 MG capsule Take 30 mg by mouth every morning.  06/17/15  Yes  Historical Provider, MD  lisinopril (PRINIVIL,ZESTRIL) 20 MG tablet Take 20 mg by mouth daily.   Yes Historical Provider, MD  metFORMIN (GLUCOPHAGE) 500 MG tablet Take 1 tablet (500 mg total) by mouth every morning. 04/25/15  Yes Birdie Sons, MD  nitroGLYCERIN (NITROSTAT) 0.4 MG SL tablet Place 1 tablet (0.4 mg total) under the tongue every 5 (five) minutes as needed for chest pain. Every 5 minutes as needed for chest pain 04/26/15  Yes Birdie Sons, MD  omega-3 acid ethyl esters (LOVAZA) 1 G capsule TAKE FOUR CAPSULES BY MOUTH DAILY 07/01/15  Yes Birdie Sons, MD  oxyCODONE-acetaminophen (PERCOCET) 10-325 MG tablet Take 1 tablet by mouth every 4 (four) hours as  needed for pain. Patient taking differently: Take 1 tablet by mouth every 4 (four) hours.  09/02/15  Yes Birdie Sons, MD  potassium chloride (K-DUR,KLOR-CON) 10 MEQ tablet Take 1 tablet (10 mEq total) by mouth 2 (two) times daily. Patient taking differently: Take 10 mEq by mouth daily.  08/02/15  Yes Loletha Grayer, MD  tamsulosin (FLOMAX) 0.4 MG CAPS capsule Take 0.4 mg by mouth daily.   Yes Historical Provider, MD  tiotropium (SPIRIVA) 18 MCG inhalation capsule Place 18 mcg into inhaler and inhale daily.   Yes Historical Provider, MD  ranolazine (RANEXA) 500 MG 12 hr tablet Take 1 tablet (500 mg total) by mouth 2 (two) times daily. Patient not taking: Reported on 09/12/2015 08/02/15   Loletha Grayer, MD    Inpatient Medications:  . amLODipine  5 mg Oral Daily  . aspirin EC  81 mg Oral Daily  . atorvastatin  80 mg Oral QHS  . carvedilol  3.125 mg Oral Daily  . clopidogrel  75 mg Oral Daily  . enoxaparin (LOVENOX) injection  40 mg Subcutaneous Q24H  . furosemide  40 mg Oral BID  . insulin aspart  0-5 Units Subcutaneous QHS  . insulin aspart  0-9 Units Subcutaneous TID WC  . isosorbide mononitrate  120 mg Oral Daily  . lisinopril  20 mg Oral Daily  . loratadine  10 mg Oral Daily  . nitroGLYCERIN  1 inch Topical 4 times  per day  . pantoprazole  40 mg Oral Daily  . potassium chloride  10 mEq Oral BID  . ranolazine  500 mg Oral BID  . tamsulosin  0.4 mg Oral Daily  . tiotropium  18 mcg Inhalation Daily      Allergies:  Allergies  Allergen Reactions  . Albuterol Sulfate [Albuterol] Other (See Comments)    Speeds heart rate up, but Xopenex wasn't effective so MD changed to Albuterol even though it causes palpatations  . Demerol [Meperidine] Hives  . Sulfa Antibiotics Hives  . Morphine Sulfate Rash    Nausea and vomiting    Social History   Social History  . Marital Status: Married    Spouse Name: N/A  . Number of Children: N/A  . Years of Education: N/A   Occupational History  . Disabled    Social History Main Topics  . Smoking status: Former Smoker    Quit date: 04/22/2013  . Smokeless tobacco: Never Used  . Alcohol Use: No     Comment: remotely quit alcohol use. Hx of heavy alcohol use.  . Drug Use: No  . Sexual Activity: Not on file   Other Topics Concern  . Not on file   Social History Narrative   Pt lives in [place] with wife.  Does not routinely exercise.     Family History  Problem Relation Age of Onset  . Heart attack Mother   . Cirrhosis Mother   . Diabetes Mother   . Depression Mother   . Heart disease Mother   . Heart attack Father   . Diabetes Father   . Depression Father   . Heart disease Father   . Parkinson's disease Brother      Review of Systems: Review of Systems  Constitutional: Positive for malaise/fatigue. Negative for fever, chills, weight loss and diaphoresis.  HENT: Negative for congestion.   Eyes: Negative for discharge and redness.  Respiratory: Positive for shortness of breath. Negative for cough, hemoptysis, sputum production and wheezing.   Cardiovascular: Positive for chest pain  and leg swelling. Negative for palpitations, orthopnea, claudication and PND.  Gastrointestinal: Negative for heartburn, nausea, vomiting and abdominal pain.    Musculoskeletal: Negative for falls.  Skin: Negative for rash.  Neurological: Positive for weakness. Negative for dizziness, tingling, tremors, sensory change, speech change, focal weakness and loss of consciousness.  Endo/Heme/Allergies: Does not bruise/bleed easily.  Psychiatric/Behavioral: Negative for substance abuse. The patient is nervous/anxious.   All other systems reviewed and are negative.    Labs:  Recent Labs  09/12/15 1801 09/12/15 2201 09/13/15 0347  TROPONINI <0.03 <0.03 <0.03   Lab Results  Component Value Date   WBC 7.4 09/12/2015   HGB 14.3 09/12/2015   HCT 43.5 09/12/2015   MCV 86.8 09/12/2015   PLT 226 09/12/2015     Recent Labs Lab 09/12/15 1801 09/13/15 0347  NA 142 141  K 4.1 3.8  CL 103 105  CO2 31 31  BUN 15 19  CREATININE 1.00 0.95  CALCIUM 9.7 8.9  PROT 7.3  --   BILITOT 0.7  --   ALKPHOS 67  --   ALT 22  --   AST 21  --   GLUCOSE 112* 114*   Lab Results  Component Value Date   CHOL 113 09/13/2015   HDL 29* 09/13/2015   LDLCALC 51 09/13/2015   TRIG 164* 09/13/2015   No results found for: DDIMER  Radiology/Studies:  Dg Chest 2 View  09/12/2015  CLINICAL DATA:  59 year old male with lower extremity swelling for the past 24 hours. Left leg numbness. Chest pain intermittently with some shortness breath today. EXAM: CHEST  2 VIEW COMPARISON:  Chest x-ray 07/31/2015. FINDINGS: Lung volumes are normal. No consolidative airspace disease. No pleural effusions. No pneumothorax. No pulmonary nodule or mass noted. Pulmonary vasculature and the cardiomediastinal silhouette are within normal limits. Atherosclerosis in the thoracic aorta. Status post median sternotomy for CABG. Multiple coronary artery stents are noted. IMPRESSION: 1. No radiographic evidence of acute cardiopulmonary disease. 2. Atherosclerosis. Electronically Signed   By: Vinnie Langton M.D.   On: 09/12/2015 18:03    EKG: NSR, 86 bpm, inferior artifact, no significant st/t  changes   Weights: Filed Weights   09/12/15 1735 09/12/15 2125  Weight: 231 lb (104.781 kg) 225 lb 14.4 oz (102.468 kg)     Physical Exam: Blood pressure 118/72, pulse 52, temperature 97.7 F (36.5 C), temperature source Oral, resp. rate 18, height 5' 7"  (1.702 m), weight 225 lb 14.4 oz (102.468 kg), SpO2 89 %. Body mass index is 35.37 kg/(m^2). General: Well developed, well nourished, in no acute distress. Head: Normocephalic, atraumatic, sclera non-icteric, no xanthomas, nares are without discharge.  Neck: Negative for carotid bruits. JVD not elevated. Lungs: Clear bilaterally to auscultation without wheezes, rales, or rhonchi. Breathing is unlabored. Heart: RRR with S1 S2. No murmurs, rubs, or gallops appreciated. Abdomen: Soft, non-tender, non-distended with normoactive bowel sounds. No hepatomegaly. No rebound/guarding. No obvious abdominal masses. Msk:  Strength and tone appear normal for age. Extremities: No clubbing or cyanosis. Trace pitting edema to the bilateral calves.  Distal pedal pulses are 2+ and equal bilaterally. Neuro: Alert and oriented X 3. No facial asymmetry. No focal deficit. Moves all extremities spontaneously. Psych:  Responds to questions appropriately with a normal affect.    Assessment and Plan:   1. Unstable angina/CAD s/p CABG x 2 s/p multiple evals/interventions since, most recently 08/21/2015 at [facility] with PCI/DES with Xience drug eluting stent as above: -Schedule Lexiscan Myoview to evaluate for high risk  ischemia  -If nuclear stress test is normal would increase Ranexa to 1000 mg bid. If abnormal would need repeat cardiac cath, though would attempt to avoid this in the setting of such recent cardiac cath as above -Continue Imdur 120 mg daily -Pulse in the 50's precludes further titration of Coreg from 3.125 mg bid -Continue aspirin 81 mg daily and Plavix 75 mg daily ( has not missed any doses as above) -Continue lisinopril 20 mg and Lipitor 80 mg  2.  Acute on chronic diastolic: -He does endorse volume overload -Will give one time dose of IV Lasix 40 mg post stress test  -Can then be discharged back on his po Lasix 40 mg bid -Continue Coreg 3.125 mg bid and lisinopril 20 mg daily -Limit fluids and salt  3. COPD: -No acute exacerbation  4. HTN: -Controlled -Continue medications as above  5. DM2: -Per IM   Signed, Christell Faith, PA-C Pager: [phone] 09/13/2015, 9:16 AM

## 2015-09-13 NOTE — Progress Notes (Signed)
Dr. Tressia Miners aware of Dr. Tyrell Antonio recommendations within [facility] note regarding stress test results and plan of care. Patient continues to complain of intermittent nausea and chest pain. Per Dr. Tressia Miners, continue current PRN medications, will monitor patient overnight. Possible discharge tomorrow.

## 2015-09-14 LAB — GLUCOSE, CAPILLARY: Glucose-Capillary: 107 mg/dL — ABNORMAL HIGH (ref 65–99)

## 2015-09-14 NOTE — Progress Notes (Signed)
Patient received discharge instructions, pt verbalized understanding. IV was removed with no signs of infection. Dressing clean, dry intact. No skin tears or wounds present. Prescription was printed and given to patient. Patient was escorted out with staff member via wheelchair via private auto. No further needs from care management team.

## 2015-09-14 NOTE — Discharge Summary (Signed)
[facility] NAME: Lawrence [name]    MR#:  [id number]  DATE OF BIRTH:  May 15, 1956  DATE OF ADMISSION:  09/12/2015 ADMITTING PHYSICIAN: Demetrios Loll, MD  DATE OF DISCHARGE: 09/14/2015  PRIMARY CARE PHYSICIAN: [name], MD    ADMISSION DIAGNOSIS:  Chest pain, unspecified chest pain type [R07.9]  DISCHARGE DIAGNOSIS:  Principal Problem:   Chest pain  Unstable angina  SECONDARY DIAGNOSIS:   Past Medical History  Diagnosis Date  . CAD (coronary artery disease)     a. 2002 CABGx2 (LIMA->LAD, VG->VG->OM1);  b. 09/2012 DES->OM;  c. 03/2015 PTCA of LAD [facility]) in setting of atretic LIMA; d. 05/2015 Cath [facility]): nonobs dzs; e. 06/2015 Cath ([facility]): LM nl, LAD 45p/d ISR, 50d, D1/2 small, LCX 50p/d ISR, OM1 70ost, 30 ISR, VG->OM1 50ost, 5m LIMA->LAD 99p/d - atretic, RCA dom, nl; f.cath 10/16: 40-50%(FFR 0.90) pLAD, 75% (FFR 0.77) mLAD s/p PCI/DES, oRCA 40% (FFR0.95)  . Essential hypertension   . COPD (chronic obstructive pulmonary disease) ([place]     a. Chronic bronchitis and emphysema.  . Type II diabetes mellitus ([place]   . Asthma   . Celiac disease   . Chronic diastolic CHF (congestive heart failure) ([place]     a. 06/2009 Echo: EF 60-65%, Gr 1 DD, triv AI, mildly dil LA, nl RV.  .Marland KitchenAnxiety   . History of tobacco abuse     a. Quit 2014.  .Marland KitchenPSVT (paroxysmal supraventricular tachycardia) ([place]     a. 10/2012 Noted on Zio Patch.    HOSPITAL COURSE:   59year old male with significant cardiac history including coronary artery disease status post CABG twice, multiple interventions and cardiac catheterizations, most recent one in October 2016 with a drug-eluting stent to LAD, COPD, hypertension, anxiety and diabetes mellitus presents to the hospital secondary to chest pain.  #1 unstable angina-with prior significant cardiac history including CABG, multiple stents, [facility]. -Ruled out for MI.  Troponins 3 are negative -Appreciate cardiology consult. -Patient already on optimal medical management. -s/p myoview done yesterday with no evidence of ischemia- however patient was monitored as he still has some chest pain and nausea overnight- improved now - likely chronic angina- started on ranexa at increased dose for now and outpatient cardiology f/u recommended - Ambulating without any worsening of symptoms and stable. -Continue all his cardiac medications.  #2 coronary artery disease status post CABG in stents, most recent stent 3 weeks ago to LAD- -on aspirin, Plavix and Coreg -Also on statin and lisinopril.  #3 Congestive heart failure- chronic systolic CHF -Ejection fraction is 60%. -Chest x-ray with no acute pulmonary edema. -Continue his outpatient dose of Lasix qdaily, advised to take an extra dose as needed  #4 hypertension-continue all home medications  #5 COPD-stable. Continue home inhalers. -No indication for a systemic steroids  Discharge today  DISCHARGE CONDITIONS:   Stable  CONSULTS OBTAINED:  Cardiology consultation by Dr. AFletcher Anon DRUG ALLERGIES:   Allergies  Allergen Reactions  . Albuterol Sulfate [Albuterol] Other (See Comments)    Speeds heart rate up, but Xopenex wasn't effective so MD changed to Albuterol even though it causes palpatations  . Demerol [Meperidine] Hives  . Sulfa Antibiotics Hives  . Morphine Sulfate Rash    Nausea and vomiting    DISCHARGE MEDICATIONS:   Current Discharge Medication List    CONTINUE these medications which have CHANGED   Details  ranolazine (RANEXA) 1000 MG  SR tablet Take 1 tablet (1,000 mg total) by mouth 2 (two) times daily. Qty: 60 tablet, Refills: 2      CONTINUE these medications which have NOT CHANGED   Details  albuterol (PROVENTIL HFA;VENTOLIN HFA) 108 (90 BASE) MCG/ACT inhaler Inhale 2 puffs into the lungs every 6 (six) hours as needed for wheezing. Qty: 1 Inhaler, Refills: 0   Associated  Diagnoses: COPD exacerbation (HCC)    ALPRAZolam (XANAX) 1 MG tablet TAKE 1/2-1 TABLET BY MOUTH 3 TIMES A DAY Qty: 90 tablet, Refills: 3    amLODipine (NORVASC) 5 MG tablet Take 5 mg by mouth daily.     aspirin EC 81 MG tablet Take 81 mg by mouth every morning.    atorvastatin (LIPITOR) 80 MG tablet Take 1 tablet (80 mg total) by mouth at bedtime. Qty: 90 tablet, Refills: 3    carvedilol (COREG) 3.125 MG tablet Take 3.125 mg by mouth daily.    cetirizine (ZYRTEC) 10 MG tablet Take 10 mg by mouth at bedtime.     clopidogrel (PLAVIX) 75 MG tablet TAKE ONE (1) TABLET EACH DAY Qty: 30 tablet, Refills: 5    furosemide (LASIX) 40 MG tablet Take 1 tablet (40 mg total) by mouth 2 (two) times daily. Qty: 60 tablet, Refills: 0    isosorbide mononitrate (IMDUR) 60 MG 24 hr tablet Take 120 mg by mouth daily.     lansoprazole (PREVACID) 30 MG capsule Take 30 mg by mouth every morning.     lisinopril (PRINIVIL,ZESTRIL) 20 MG tablet Take 20 mg by mouth daily.    metFORMIN (GLUCOPHAGE) 500 MG tablet Take 1 tablet (500 mg total) by mouth every morning. Qty: 90 tablet, Refills: 4    nitroGLYCERIN (NITROSTAT) 0.4 MG SL tablet Place 1 tablet (0.4 mg total) under the tongue every 5 (five) minutes as needed for chest pain. Every 5 minutes as needed for chest pain Qty: 30 tablet, Refills: 1    omega-3 acid ethyl esters (LOVAZA) 1 G capsule TAKE FOUR CAPSULES BY MOUTH DAILY Qty: 120 capsule, Refills: 12    oxyCODONE-acetaminophen (PERCOCET) 10-325 MG tablet Take 1 tablet by mouth every 4 (four) hours as needed for pain. Qty: 150 tablet, Refills: 0    potassium chloride (K-DUR,KLOR-CON) 10 MEQ tablet Take 1 tablet (10 mEq total) by mouth 2 (two) times daily. Qty: 60 tablet, Refills: 0    tamsulosin (FLOMAX) 0.4 MG CAPS capsule Take 0.4 mg by mouth daily.    tiotropium (SPIRIVA) 18 MCG inhalation capsule Place 18 mcg into inhaler and inhale daily.         DISCHARGE INSTRUCTIONS:   1. PCP  f/u in 1-2 weeks 2. Cardiology f/u with [facility] in 1 week  If you experience worsening of your admission symptoms, develop shortness of breath, life threatening emergency, suicidal or homicidal thoughts you must seek medical attention immediately by calling 911 or calling your MD immediately  if symptoms less severe.  You Must read complete instructions/literature along with all the possible adverse reactions/side effects for all the Medicines you take and that have been prescribed to you. Take any new Medicines after you have completely understood and accept all the possible adverse reactions/side effects.   Please note  You were cared for by a hospitalist during your hospital stay. If you have any questions about your discharge medications or the care you received while you were in the hospital after you are discharged, you can call the unit and asked to speak with the hospitalist on  call if the hospitalist that took care of you is not available. Once you are discharged, your primary care physician will handle any further medical issues. Please note that NO REFILLS for any discharge medications will be authorized once you are discharged, as it is imperative that you return to your primary care physician (or establish a relationship with a primary care physician if you do not have one) for your aftercare needs so that they can reassess your need for medications and monitor your lab values.    Today   CHIEF COMPLAINT:   Chief Complaint  Patient presents with  . Chest Pain    VITAL SIGNS:  Blood pressure 111/70, pulse 61, temperature 98.1 F (36.7 C), temperature source Oral, resp. rate 18, height 5' 7"  (1.702 m), weight 102.468 kg (225 lb 14.4 oz), SpO2 92 %.  I/O:   Intake/Output Summary (Last 24 hours) at 09/14/15 1056 Last data filed at 09/14/15 0944  Gross per 24 hour  Intake    240 ml  Output      0 ml  Net    240 ml    PHYSICAL EXAMINATION:   Physical Exam   GENERAL: 59  y.o.-year-old patient lying in the bed with no acute distress.  EYES: Pupils equal, round, reactive to light and accommodation. No scleral icterus. Extraocular muscles intact.  HEENT: Head atraumatic, normocephalic. Oropharynx and nasopharynx clear.  NECK: Supple, no jugular venous distention. No thyroid enlargement, no tenderness.  LUNGS: Normal breath sounds bilaterally, no wheezing, rales,rhonchi or crepitation. No use of accessory muscles of respiration.  CARDIOVASCULAR: S1, S2 normal. No rubs, or gallops. 3/6 systolic murmur, no chest wall tenderness ABDOMEN: Soft, nontender, nondistended. Bowel sounds present. No organomegaly or mass.  EXTREMITIES: 1-2+ pedal edema noted. No cyanosis, or clubbing.  NEUROLOGIC: Cranial nerves II through XII are intact. Muscle strength 5/5 in all extremities. Sensation intact. Gait not checked.  PSYCHIATRIC: The patient is alert and oriented x 3.  SKIN: No obvious rash, lesion, or ulcer.   DATA REVIEW:   CBC  Recent Labs Lab 09/12/15 1801  WBC 7.4  HGB 14.3  HCT 43.5  PLT 226    Chemistries   Recent Labs Lab 09/12/15 1801 09/13/15 0347  NA 142 141  K 4.1 3.8  CL 103 105  CO2 31 31  GLUCOSE 112* 114*  BUN 15 19  CREATININE 1.00 0.95  CALCIUM 9.7 8.9  AST 21  --   ALT 22  --   ALKPHOS 67  --   BILITOT 0.7  --     Cardiac Enzymes  Recent Labs Lab 09/13/15 0936  TROPONINI <0.03    Microbiology Results  Results for orders placed or performed during the hospital encounter of 07/11/15  MRSA PCR Screening     Status: None   Collection Time: 07/11/15  9:09 PM  Result Value Ref Range Status   MRSA by PCR NEGATIVE NEGATIVE Final    Comment:        The GeneXpert MRSA Assay (FDA approved for NASAL specimens only), is one component of a comprehensive MRSA colonization surveillance program. It is not intended to diagnose MRSA infection nor to guide or monitor treatment for MRSA infections.     RADIOLOGY:  Dg  Chest 2 View  09/12/2015  CLINICAL DATA:  59 year old male with lower extremity swelling for the past 24 hours. Left leg numbness. Chest pain intermittently with some shortness breath today. EXAM: CHEST  2 VIEW COMPARISON:  Chest x-ray 07/31/2015. FINDINGS:  Lung volumes are normal. No consolidative airspace disease. No pleural effusions. No pneumothorax. No pulmonary nodule or mass noted. Pulmonary vasculature and the cardiomediastinal silhouette are within normal limits. Atherosclerosis in the thoracic aorta. Status post median sternotomy for CABG. Multiple coronary artery stents are noted. IMPRESSION: 1. No radiographic evidence of acute cardiopulmonary disease. 2. Atherosclerosis. Electronically Signed   By: Vinnie Langton M.D.   On: 09/12/2015 18:03   Nm Myocar Multi W/spect W/wall Motion / Ef  09/13/2015   T wave inversion was noted during stress in the II, III, aVF, V5 and V6 leads.  Defect 1: There is a small defect of moderate severity present in the apex location. There is no reversibility. This is likely due to an attenuation artifact.  The study is normal.  This is a low risk study.  The left ventricular ejection fraction is normal (55-65%).     EKG:   Orders placed or performed during the hospital encounter of 09/12/15  . ED EKG within 10 minutes  . ED EKG within 10 minutes  . EKG 12-Lead  . EKG 12-Lead  . ED EKG  . ED EKG      Management plans discussed with the patient, family and they are in agreement.  CODE STATUS:     Code Status Orders        Start     Ordered   09/12/15 2130  Full code   Continuous     09/12/15 2129      TOTAL TIME TAKING CARE OF THIS PATIENT: 37 minutes.    Dorri Ozturk M.D on 09/14/2015 at 10:56 AM  Between 7am to 6pm - Pager - [phone]  After 6pm go to [email or web address] - password [email or web address] [facility]  Office  [phone]  CC: Primary care physician; [name], MD

## 2015-09-21 ENCOUNTER — Emergency Department
Admission: EM | Admit: 2015-09-21 | Discharge: 2015-09-21 | Disposition: A | Discharge status: Home / Self Care | Payer: Medicare PPO | Attending: Emergency Medicine | Admitting: Emergency Medicine

## 2015-09-21 DIAGNOSIS — Z87891 Personal history of nicotine dependence: Secondary | ICD-10-CM | POA: Insufficient documentation

## 2015-09-21 DIAGNOSIS — Z7902 Long term (current) use of antithrombotics/antiplatelets: Secondary | ICD-10-CM | POA: Diagnosis not present

## 2015-09-21 DIAGNOSIS — Z7982 Long term (current) use of aspirin: Secondary | ICD-10-CM | POA: Insufficient documentation

## 2015-09-21 DIAGNOSIS — E119 Type 2 diabetes mellitus without complications: Secondary | ICD-10-CM | POA: Diagnosis not present

## 2015-09-21 DIAGNOSIS — Z79899 Other long term (current) drug therapy: Secondary | ICD-10-CM | POA: Diagnosis not present

## 2015-09-21 DIAGNOSIS — M25562 Pain in left knee: Secondary | ICD-10-CM | POA: Diagnosis present

## 2015-09-21 DIAGNOSIS — Z7984 Long term (current) use of oral hypoglycemic drugs: Secondary | ICD-10-CM | POA: Diagnosis not present

## 2015-09-21 DIAGNOSIS — M25462 Effusion, left knee: Secondary | ICD-10-CM | POA: Diagnosis not present

## 2015-09-21 DIAGNOSIS — I10 Essential (primary) hypertension: Secondary | ICD-10-CM | POA: Diagnosis not present

## 2015-09-21 NOTE — ED Notes (Signed)
Spoke with dr. Cinda Quest regarding pt's chief complaint and no known injury. No orders received at this time.

## 2015-09-21 NOTE — ED Provider Notes (Signed)
[facility] Emergency Department Provider Note  ____________________________________________  Time seen: On arrival  I have reviewed the triage vital signs and the nursing notes.   HISTORY  Chief Complaint Knee Pain    HPI Lawrence [name] is a 59 y.o. male who presents with complaints of left knee swelling area and he reports he noticed it this morning and has some discomfort when he walks on it. He denies injury to the area. No erythema or trauma. No history of similar discomfort. He denies calf pain.    Past Medical History  Diagnosis Date  . CAD (coronary artery disease)     a. 2002 CABGx2 (LIMA->LAD, VG->VG->OM1);  b. 09/2012 DES->OM;  c. 03/2015 PTCA of LAD [facility]) in setting of atretic LIMA; d. 05/2015 Cath [facility]): nonobs dzs; e. 06/2015 Cath ([facility]): LM nl, LAD 45p/d ISR, 50d, D1/2 small, LCX 50p/d ISR, OM1 70ost, 30 ISR, VG->OM1 50ost, 54m LIMA->LAD 99p/d - atretic, RCA dom, nl; f.cath 10/16: 40-50%(FFR 0.90) pLAD, 75% (FFR 0.77) mLAD s/p PCI/DES, oRCA 40% (FFR0.95)  . Essential hypertension   . COPD (chronic obstructive pulmonary disease) ([place]     a. Chronic bronchitis and emphysema.  . Type II diabetes mellitus ([place]   . Asthma   . Celiac disease   . Chronic diastolic CHF (congestive heart failure) ([place]     a. 06/2009 Echo: EF 60-65%, Gr 1 DD, triv AI, mildly dil LA, nl RV.  .Marland KitchenAnxiety   . History of tobacco abuse     a. Quit 2014.  .Marland KitchenPSVT (paroxysmal supraventricular tachycardia) ([facility]     a. 10/2012 Noted on Zio Patch.    Patient Active Problem List   Diagnosis Date Noted  . Nocturnal hypoxia 09/06/2015  . BPH (benign prostatic hyperplasia) 08/01/2015  . Essential hypertension   . Chronic diastolic CHF (congestive heart failure) ([place]   . Angina at rest ([facility]   . Pain in the chest   . Unstable angina ([place] 07/11/2015  . Chest pain 07/11/2015  . COPD (chronic obstructive pulmonary disease) ([place] 07/03/2015  . COPD exacerbation ([place]  06/26/2015  . CAD (coronary artery disease) 06/26/2015  . HTN (hypertension) 06/26/2015  . DM (diabetes mellitus) ([place] 06/26/2015  . Achalasia 07/24/2014  . GERD (gastroesophageal reflux disease) 06/07/2014  . Former tobacco use 04/11/2013  . Chest pain at rest 04/09/2013  . HLD (hyperlipidemia) 04/09/2013    Past Surgical History  Procedure Laterality Date  . Vascular surgery    . Bypass graft    . Tonsillectomy    . Cholecystectomy    . Cardiac catheterization N/A 07/12/2015    rocedure: Left Heart Cath and Cors/Grafts Angiography;  Surgeon: HBelva Crome MD;  [facility];  Service: Cardiovascular;  Laterality: N/A;  . Esophageal dilation      Current Outpatient Rx  Name  Route  Sig  Dispense  Refill  . albuterol (PROVENTIL HFA;VENTOLIN HFA) 108 (90 BASE) MCG/ACT inhaler   Inhalation   Inhale 2 puffs into the lungs every 6 (six) hours as needed for wheezing.   1 Inhaler   0   . ALPRAZolam (XANAX) 1 MG tablet      TAKE 1/2-1 TABLET BY MOUTH 3 TIMES A DAY Patient taking differently: 1 TABLET BY MOUTH 3 TIMES DAILY   90 tablet   3   . amLODipine (NORVASC) 5 MG tablet   Oral   Take 5 mg by mouth daily.          .Marland Kitchen  aspirin EC 81 MG tablet   Oral   Take 81 mg by mouth every morning.         Marland Kitchen atorvastatin (LIPITOR) 80 MG tablet   Oral   Take 1 tablet (80 mg total) by mouth at bedtime.   90 tablet   3   . carvedilol (COREG) 3.125 MG tablet   Oral   Take 3.125 mg by mouth daily.         . cetirizine (ZYRTEC) 10 MG tablet   Oral   Take 10 mg by mouth at bedtime.          . clopidogrel (PLAVIX) 75 MG tablet      TAKE ONE (1) TABLET EACH DAY   30 tablet   5   . furosemide (LASIX) 40 MG tablet   Oral   Take 1 tablet (40 mg total) by mouth 2 (two) times daily. Patient taking differently: Take 40 mg by mouth daily.    60 tablet   0   . isosorbide mononitrate (IMDUR) 60 MG 24 hr tablet   Oral   Take 120 mg by mouth daily.           . lansoprazole (PREVACID) 30 MG capsule   Oral   Take 30 mg by mouth every morning.          Marland Kitchen lisinopril (PRINIVIL,ZESTRIL) 20 MG tablet   Oral   Take 20 mg by mouth daily.         . metFORMIN (GLUCOPHAGE) 500 MG tablet   Oral   Take 1 tablet (500 mg total) by mouth every morning.   90 tablet   4   . nitroGLYCERIN (NITROSTAT) 0.4 MG SL tablet   Sublingual   Place 1 tablet (0.4 mg total) under the tongue every 5 (five) minutes as needed for chest pain. Every 5 minutes as needed for chest pain   30 tablet   1   . omega-3 acid ethyl esters (LOVAZA) 1 G capsule      TAKE FOUR CAPSULES BY MOUTH DAILY   120 capsule   12   . oxyCODONE-acetaminophen (PERCOCET) 10-325 MG tablet   Oral   Take 1 tablet by mouth every 4 (four) hours as needed for pain. Patient taking differently: Take 1 tablet by mouth every 4 (four) hours.    150 tablet   0   . potassium chloride (K-DUR,KLOR-CON) 10 MEQ tablet   Oral   Take 1 tablet (10 mEq total) by mouth 2 (two) times daily. Patient taking differently: Take 10 mEq by mouth daily.    60 tablet   0   . ranolazine (RANEXA) 1000 MG SR tablet   Oral   Take 1 tablet (1,000 mg total) by mouth 2 (two) times daily.   60 tablet   2   . tamsulosin (FLOMAX) 0.4 MG CAPS capsule   Oral   Take 0.4 mg by mouth daily.         Marland Kitchen tiotropium (SPIRIVA) 18 MCG inhalation capsule   Inhalation   Place 18 mcg into inhaler and inhale daily.           Allergies Albuterol sulfate; Demerol; Sulfa antibiotics; and Morphine sulfate  Family History  Problem Relation Age of Onset  . Heart attack Mother   . Cirrhosis Mother   . Diabetes Mother   . Depression Mother   . Heart disease Mother   . Heart attack Father   . Diabetes Father   . Depression  Father   . Heart disease Father   . Parkinson's disease Brother     Social History Social History  Substance Use Topics  . Smoking status: Former Smoker    Quit date: 04/22/2013  . Smokeless  tobacco: Never Used  . Alcohol Use: No     Comment: remotely quit alcohol use. Hx of heavy alcohol use.    Review of Systems  Constitutional: Negative for fever.    Musculoskeletal: Negative for back pain. Positive for left knee discomfort Skin: Negative for rash. Neurological: Negative for headaches or focal weakness   ____________________________________________   PHYSICAL EXAM:  VITAL SIGNS: ED Triage Vitals  Enc Vitals Group     BP 09/21/15 2217 142/77 mmHg     Pulse Rate 09/21/15 2217 82     Resp 09/21/15 2217 16     Temp 09/21/15 2217 97.6 F (36.4 C)     Temp Source 09/21/15 2217 Oral     SpO2 09/21/15 2217 96 %     Weight 09/21/15 2217 231 lb (104.781 kg)     Height 09/21/15 2217 5' 7"  (1.702 m)     Head Cir --      Peak Flow --      Pain Score 09/21/15 2217 8     Pain Loc --      Pain Edu? --      Excl. in [place]? --      Constitutional: Alert and oriented. Well appearing and in no distress. Eyes: Conjunctivae are normal.  ENT   Head: Normocephalic and atraumatic.   Mouth/Throat: Mucous membranes are moist. Cardiovascular: Normal rate, regular rhythm.  Respiratory: Normal respiratory effort without tachypnea nor retractions.  Gastrointestinal: Soft and non-tender in all quadrants. No distention. There is no CVA tenderness. Musculoskeletal: Nontender with normal range of motion in all extremities. Left knee exam: Ligaments are intact, mild effusion noted superior laterally. No calf swelling, no erythema, 2+ distal pulses, extremity is warm and well perfused Neurologic:  Normal speech and language. No gross focal neurologic deficits are appreciated. Skin:  Skin is warm, dry and intact. No rash noted. Psychiatric: Mood and affect are normal. Patient exhibits appropriate insight and judgment.  ____________________________________________    LABS (pertinent positives/negatives)  Labs Reviewed - No data to  display  ____________________________________________     ____________________________________________    RADIOLOGY I have personally reviewed any xrays that were ordered on this patient: None  ____________________________________________   PROCEDURES  Procedure(s) performed: none   ____________________________________________   INITIAL IMPRESSION / ASSESSMENT AND PLAN / ED COURSE  Pertinent labs & imaging results that were available during my care of the patient were reviewed by me and considered in my medical decision making (see chart for details).  Patient with small knee effusion of unknown origin. No evidence of infection. Recommend rice PCP follow-up  ____________________________________________   FINAL CLINICAL IMPRESSION(S) / ED DIAGNOSES  Final diagnoses:  Knee effusion, left     Lavonia Drafts, MD 09/21/15 2339

## 2015-09-21 NOTE — ED Notes (Signed)
Pt states has pain to left knee, pt states "it hurts when i walk on it." no swelling noted. Pt with pain on palpation to left lateral knee. Cms intact to toes.

## 2015-09-21 NOTE — Discharge Instructions (Signed)
Knee Effusion °Knee effusion means that you have extra fluid in your knee. This can cause pain. Your knee may be more difficult to bend and move. °HOME CARE °· Use crutches as told by your doctor. °· Wear a knee brace as told by your doctor. °· Apply ice to the swollen area: °¨ Put ice in a plastic bag. °¨ Place a towel between your skin and the bag. °¨ Leave the ice on for 20 minutes, 2-3 times per day. °· Keep your knee raised (elevated) when you are sitting or lying down. °· Take medicines only as told by your doctor. °· Do any rehabilitation or strengthening exercises as told by your doctor. °· Rest your knee as told by your doctor. You may start doing your normal activities again when your doctor says it is okay. °· Keep all follow-up visits as told by your doctor. This is important. °GET HELP IF:  °· You continue to have pain in your knee. °GET HELP RIGHT AWAY IF: °· You have increased swelling or redness of your knee. °· You have severe pain in your knee. °· You have a fever. °  °This information is not intended to replace advice given to you by your health care provider. Make sure you discuss any questions you have with your health care provider. °  °Document Released: 12/05/2010 Document Revised: 11/23/2014 Document Reviewed: 06/18/2014 °Elsevier Interactive Patient Education ©2016 [employer] ° °

## 2015-09-21 NOTE — ED Notes (Signed)
Patient with no complaints at this time. Respirations even and unlabored. Skin warm/dry. Discharge instructions reviewed with patient at this time. Patient given opportunity to voice concerns/ask questions. Patient discharged at this time and left Emergency Department, via wheelchair.

## 2015-09-27 ENCOUNTER — Telehealth: Payer: Self-pay | Admitting: Family Medicine

## 2015-09-27 NOTE — Telephone Encounter (Signed)
[facility] called for a hospital follow for Chest pain.  Pt is being discharged today.  [facility] will fax all records.  I have scheduled a hospital follow up for Monday/MW

## 2015-09-30 ENCOUNTER — Encounter: Payer: Self-pay | Admitting: Family Medicine

## 2015-09-30 ENCOUNTER — Ambulatory Visit
Admission: RE | Admit: 2015-09-30 | Discharge: 2015-09-30 | Disposition: A | Discharge status: Home / Self Care | Payer: Medicare PPO | Source: Ambulatory Visit | Attending: Family Medicine | Admitting: Family Medicine

## 2015-09-30 ENCOUNTER — Ambulatory Visit (INDEPENDENT_AMBULATORY_CARE_PROVIDER_SITE_OTHER): Payer: Medicare PPO | Admitting: Family Medicine

## 2015-09-30 VITALS — BP 118/60 | HR 74 | Temp 97.6°F | Resp 16 | Wt 232.0 lb

## 2015-09-30 DIAGNOSIS — M5410 Radiculopathy, site unspecified: Secondary | ICD-10-CM | POA: Insufficient documentation

## 2015-09-30 DIAGNOSIS — M47816 Spondylosis without myelopathy or radiculopathy, lumbar region: Secondary | ICD-10-CM | POA: Diagnosis not present

## 2015-09-30 DIAGNOSIS — I25118 Atherosclerotic heart disease of native coronary artery with other forms of angina pectoris: Secondary | ICD-10-CM

## 2015-09-30 DIAGNOSIS — R600 Localized edema: Secondary | ICD-10-CM | POA: Diagnosis not present

## 2015-09-30 MED ORDER — OXYCODONE-ACETAMINOPHEN 10-325 MG PO TABS: 1.0000 | ORAL_TABLET | ORAL | Status: DC | PRN

## 2015-09-30 NOTE — Progress Notes (Signed)
Patient: Lawrence [name] Male    DOB: [birth year]   60 y.o.   MRN: [id number] Visit Date: 09/30/2015  Today's Provider: [name], MD   Chief Complaint  Patient presents with  . Hospitalization Follow-up  . Chest Pain   Subjective:    HPI  Follow up Hospitalization  Patient was admitted to [facility] on 09/25/2010 and discharged on 09/27/2015. He was treated for chest pain and leg sweling Treatment for this included increasing Ranexa from 530m to 10022mtwice daily. He reports good compliance with treatment. He reports this condition is Improved. Patient has not had any chest pain since being hospitalized. Patient reports that he has had some pain in his left leg which he describes as a burring/ stinging numbness sensation. Patient has had swelling in his feet and in his left leg.  Patient has also had some shortness of breath, but states it is unchanged from baseline with COPD.  Patient has a follow up appointment to see his Cardiologist Dr. RaGerome Samn 11/25/2014.   ------------------------------------------------------------------------------------     Allergies  Allergen Reactions  . Albuterol Sulfate [Albuterol] Other (See Comments)    Speeds heart rate up, but Xopenex wasn't effective so MD changed to Albuterol even though it causes palpatations  . Demerol [Meperidine] Hives  . Sulfa Antibiotics Hives  . Morphine Sulfate Rash    Nausea and vomiting   Previous Medications   ALBUTEROL (PROVENTIL HFA;VENTOLIN HFA) 108 (90 BASE) MCG/ACT INHALER    Inhale 2 puffs into the lungs every 6 (six) hours as needed for wheezing.   ALPRAZOLAM (XANAX) 1 MG TABLET    TAKE 1/2-1 TABLET BY MOUTH 3 TIMES A DAY   AMLODIPINE (NORVASC) 5 MG TABLET    Take 5 mg by mouth daily.    ASPIRIN EC 81 MG TABLET    Take 81 mg by mouth every morning.   ATORVASTATIN (LIPITOR) 80 MG TABLET    Take 1 tablet (80 mg total) by mouth at bedtime.   CARVEDILOL (COREG) 3.125 MG TABLET    Take 3.125  mg by mouth daily.   CETIRIZINE (ZYRTEC) 10 MG TABLET    Take 10 mg by mouth at bedtime.    CLOPIDOGREL (PLAVIX) 75 MG TABLET    TAKE ONE (1) TABLET EACH DAY   FUROSEMIDE (LASIX) 40 MG TABLET    Take 1 tablet (40 mg total) by mouth 2 (two) times daily.   ISOSORBIDE MONONITRATE (IMDUR) 60 MG 24 HR TABLET    Take 120 mg by mouth daily.    LANSOPRAZOLE (PREVACID) 30 MG CAPSULE    Take 30 mg by mouth every morning.    LISINOPRIL (PRINIVIL,ZESTRIL) 20 MG TABLET    Take 20 mg by mouth daily.   METFORMIN (GLUCOPHAGE) 500 MG TABLET    Take 1 tablet (500 mg total) by mouth every morning.   NITROGLYCERIN (NITROSTAT) 0.4 MG SL TABLET    Place 1 tablet (0.4 mg total) under the tongue every 5 (five) minutes as needed for chest pain. Every 5 minutes as needed for chest pain   OMEGA-3 ACID ETHYL ESTERS (LOVAZA) 1 G CAPSULE    TAKE FOUR CAPSULES BY MOUTH DAILY   OXYCODONE-ACETAMINOPHEN (PERCOCET) 10-325 MG TABLET    Take 1 tablet by mouth every 4 (four) hours as needed for pain.   POTASSIUM CHLORIDE (K-DUR,KLOR-CON) 10 MEQ TABLET    Take 1 tablet (10 mEq total) by mouth 2 (two) times daily.   RANOLAZINE (RANEXA)  1000 MG SR TABLET    Take 1 tablet (1,000 mg total) by mouth 2 (two) times daily.   TAMSULOSIN (FLOMAX) 0.4 MG CAPS CAPSULE    Take 0.4 mg by mouth daily.   TIOTROPIUM (SPIRIVA) 18 MCG INHALATION CAPSULE    Place 18 mcg into inhaler and inhale daily.    Review of Systems  Constitutional: Negative for fever, chills, diaphoresis, appetite change and fatigue.  HENT: Negative for congestion, ear pain, hearing loss, nosebleeds and trouble swallowing.   Eyes: Negative for pain and visual disturbance.  Respiratory: Positive for shortness of breath. Negative for cough, chest tightness and wheezing.   Cardiovascular: Positive for leg swelling. Negative for chest pain and palpitations.  Gastrointestinal: Negative for nausea, vomiting, abdominal pain, diarrhea, constipation and blood in stool.  Endocrine:  Negative for polydipsia, polyphagia and polyuria.  Genitourinary: Negative for dysuria and flank pain.  Musculoskeletal: Positive for myalgias (left leg). Negative for back pain, joint swelling, arthralgias and neck stiffness.  Skin: Negative for color change, rash and wound.  Neurological: Positive for weakness and numbness. Negative for dizziness, tremors, seizures, speech difficulty, light-headedness and headaches.  Psychiatric/Behavioral: Negative for behavioral problems, confusion, sleep disturbance, dysphoric mood and decreased concentration. The patient is not nervous/anxious.   All other systems reviewed and are negative.   Social History  Substance Use Topics  . Smoking status: Former Smoker    Quit date: 04/22/2013  . Smokeless tobacco: Never Used  . Alcohol Use: No     Comment: remotely quit alcohol use. Hx of heavy alcohol use.   Objective:   BP 118/60 mmHg  Pulse 74  Temp(Src) 97.6 F (36.4 C) (Oral)  Resp 16  Wt 232 lb (105.235 kg)  SpO2 98%  Physical Exam   General Appearance:    Alert, cooperative, no distress  Eyes:    PERRL, conjunctiva/corneas clear, EOM's intact       Lungs:     Clear to auscultation bilaterally, respirations unlabored  Heart:    Regular rate and rhythm  Neurologic:   Awake, alert, oriented x 3. No apparent focal neurological           defect.           Assessment & Plan:     1. Back pain with left-sided radiculopathy  - DG Lumbar Spine Complete; Future - oxyCODONE-acetaminophen (PERCOCET) 10-325 MG tablet; Take 1 tablet by mouth every 4 (four) hours as needed for pain.  Dispense: 150 tablet; Refill: 0  2. Coronary artery disease involving native coronary artery of native heart with other form of angina pectoris (HCC) Pain resolved since discharge. Continue current medications.    3. Localized edema Improved.         [name], MD  [facility]

## 2015-10-01 ENCOUNTER — Inpatient Hospital Stay: Payer: Medicare PPO | Admitting: Family Medicine

## 2015-10-01 ENCOUNTER — Other Ambulatory Visit: Payer: Self-pay | Admitting: *Deleted

## 2015-10-01 ENCOUNTER — Telehealth: Payer: Self-pay | Admitting: *Deleted

## 2015-10-01 MED ORDER — PREDNISONE 10 MG PO TABS: ORAL_TABLET | ORAL | Status: DC

## 2015-10-01 MED ORDER — LANSOPRAZOLE 30 MG PO CPDR: 30.0000 mg | DELAYED_RELEASE_CAPSULE | Freq: Every morning | ORAL | Status: DC

## 2015-10-01 NOTE — Telephone Encounter (Signed)
-----   Message from Birdie Sons, MD sent at 10/01/2015  8:09 AM EST ----- Xray of back is normal. Recommend 12 day prednisone taper due to numbness in leg. If it is not resolved when finished with prednisone then he may need to see a neurologist.

## 2015-10-01 NOTE — Telephone Encounter (Signed)
Patient was notified of results. Patient expressed understanding. Rx sent to pharmacy.

## 2015-10-04 ENCOUNTER — Ambulatory Visit: Payer: Self-pay | Admitting: Family Medicine

## 2015-10-04 ENCOUNTER — Other Ambulatory Visit: Payer: Self-pay

## 2015-10-04 NOTE — Telephone Encounter (Signed)
Pt called requesting Nitrostat refilled. Please review-aa

## 2015-10-05 ENCOUNTER — Inpatient Hospital Stay
Admission: EM | Admit: 2015-10-05 | Discharge: 2015-10-07 | DRG: 287 | Disposition: A | Discharge status: Home / Self Care | Payer: Medicare PPO | Attending: Cardiovascular Disease | Admitting: Cardiovascular Disease

## 2015-10-05 ENCOUNTER — Emergency Department: Payer: Medicare PPO

## 2015-10-05 ENCOUNTER — Encounter: Payer: Self-pay | Admitting: Emergency Medicine

## 2015-10-05 DIAGNOSIS — R079 Chest pain, unspecified: Secondary | ICD-10-CM | POA: Diagnosis present

## 2015-10-05 DIAGNOSIS — J45909 Unspecified asthma, uncomplicated: Secondary | ICD-10-CM | POA: Diagnosis present

## 2015-10-05 DIAGNOSIS — F419 Anxiety disorder, unspecified: Secondary | ICD-10-CM | POA: Diagnosis present

## 2015-10-05 DIAGNOSIS — Z7952 Long term (current) use of systemic steroids: Secondary | ICD-10-CM

## 2015-10-05 DIAGNOSIS — I110 Hypertensive heart disease with heart failure: Secondary | ICD-10-CM | POA: Diagnosis present

## 2015-10-05 DIAGNOSIS — I480 Paroxysmal atrial fibrillation: Secondary | ICD-10-CM | POA: Diagnosis present

## 2015-10-05 DIAGNOSIS — Z951 Presence of aortocoronary bypass graft: Secondary | ICD-10-CM

## 2015-10-05 DIAGNOSIS — Z7984 Long term (current) use of oral hypoglycemic drugs: Secondary | ICD-10-CM

## 2015-10-05 DIAGNOSIS — K900 Celiac disease: Secondary | ICD-10-CM | POA: Diagnosis present

## 2015-10-05 DIAGNOSIS — I25110 Atherosclerotic heart disease of native coronary artery with unstable angina pectoris: Principal | ICD-10-CM | POA: Diagnosis present

## 2015-10-05 DIAGNOSIS — Z7902 Long term (current) use of antithrombotics/antiplatelets: Secondary | ICD-10-CM

## 2015-10-05 DIAGNOSIS — I5032 Chronic diastolic (congestive) heart failure: Secondary | ICD-10-CM | POA: Diagnosis present

## 2015-10-05 DIAGNOSIS — Z79899 Other long term (current) drug therapy: Secondary | ICD-10-CM | POA: Diagnosis not present

## 2015-10-05 DIAGNOSIS — N400 Enlarged prostate without lower urinary tract symptoms: Secondary | ICD-10-CM | POA: Diagnosis present

## 2015-10-05 DIAGNOSIS — Z955 Presence of coronary angioplasty implant and graft: Secondary | ICD-10-CM | POA: Diagnosis not present

## 2015-10-05 DIAGNOSIS — J449 Chronic obstructive pulmonary disease, unspecified: Secondary | ICD-10-CM | POA: Diagnosis present

## 2015-10-05 DIAGNOSIS — Z79891 Long term (current) use of opiate analgesic: Secondary | ICD-10-CM | POA: Diagnosis not present

## 2015-10-05 DIAGNOSIS — Z888 Allergy status to other drugs, medicaments and biological substances status: Secondary | ICD-10-CM | POA: Diagnosis not present

## 2015-10-05 DIAGNOSIS — I249 Acute ischemic heart disease, unspecified: Secondary | ICD-10-CM | POA: Diagnosis present

## 2015-10-05 DIAGNOSIS — M5416 Radiculopathy, lumbar region: Secondary | ICD-10-CM | POA: Diagnosis present

## 2015-10-05 DIAGNOSIS — Z885 Allergy status to narcotic agent status: Secondary | ICD-10-CM | POA: Diagnosis not present

## 2015-10-05 DIAGNOSIS — Z87891 Personal history of nicotine dependence: Secondary | ICD-10-CM

## 2015-10-05 DIAGNOSIS — E785 Hyperlipidemia, unspecified: Secondary | ICD-10-CM | POA: Diagnosis present

## 2015-10-05 DIAGNOSIS — I4891 Unspecified atrial fibrillation: Secondary | ICD-10-CM

## 2015-10-05 DIAGNOSIS — Z882 Allergy status to sulfonamides status: Secondary | ICD-10-CM

## 2015-10-05 DIAGNOSIS — E119 Type 2 diabetes mellitus without complications: Secondary | ICD-10-CM | POA: Diagnosis present

## 2015-10-05 DIAGNOSIS — I200 Unstable angina: Secondary | ICD-10-CM

## 2015-10-05 HISTORY — DX: Cardiac arrhythmia, unspecified: I49.9

## 2015-10-05 LAB — CBC
HCT: 42.4 % (ref 40.0–52.0)
Hemoglobin: 14 g/dL (ref 13.0–18.0)
MCH: 28.2 pg (ref 26.0–34.0)
MCHC: 33.1 g/dL (ref 32.0–36.0)
MCV: 85.2 fL (ref 80.0–100.0)
Platelets: 230 10*3/uL (ref 150–440)
RBC: 4.98 MIL/uL (ref 4.40–5.90)
RDW: 15.1 % — ABNORMAL HIGH (ref 11.5–14.5)
WBC: 7.6 10*3/uL (ref 3.8–10.6)

## 2015-10-05 LAB — COMPREHENSIVE METABOLIC PANEL
ALT: 20 U/L (ref 17–63)
AST: 22 U/L (ref 15–41)
Albumin: 3.5 g/dL (ref 3.5–5.0)
Alkaline Phosphatase: 54 U/L (ref 38–126)
Anion gap: 7 (ref 5–15)
BUN: 24 mg/dL — ABNORMAL HIGH (ref 6–20)
CO2: 27 mmol/L (ref 22–32)
Calcium: 9.4 mg/dL (ref 8.9–10.3)
Chloride: 105 mmol/L (ref 101–111)
Creatinine, Ser: 1.27 mg/dL — ABNORMAL HIGH (ref 0.61–1.24)
GFR calc Af Amer: >60 mL/min (ref >60)
GFR calc non Af Amer: 59 mL/min — ABNORMAL LOW (ref >60)
Glucose, Bld: 156 mg/dL — ABNORMAL HIGH (ref 65–99)
Potassium: 4.8 mmol/L (ref 3.5–5.1)
Sodium: 139 mmol/L (ref 135–145)
Total Bilirubin: 0.8 mg/dL (ref 0.3–1.2)
Total Protein: 6.6 g/dL (ref 6.5–8.1)

## 2015-10-05 LAB — GLUCOSE, CAPILLARY: Glucose-Capillary: 166 mg/dL — ABNORMAL HIGH (ref 65–99)

## 2015-10-05 LAB — TSH: TSH: 0.41 u[IU]/mL (ref 0.350–4.500)

## 2015-10-05 LAB — TROPONIN I
Troponin I: <0.03 ng/mL (ref <0.031)
Troponin I: <0.03 ng/mL (ref <0.031)

## 2015-10-05 LAB — MAGNESIUM: Magnesium: 1.9 mg/dL (ref 1.7–2.4)

## 2015-10-05 MED ORDER — BISACODYL 10 MG RE SUPP: 10.0000 mg | Freq: Every day | RECTAL | Status: DC | PRN

## 2015-10-05 MED ORDER — ALPRAZOLAM 1 MG PO TABS
1.0000 mg | ORAL_TABLET | Freq: Three times a day (TID) | ORAL | Status: DC
  Administered 2015-10-05 – 2015-10-06 (×4): 1 mg via ORAL
  Filled 2015-10-05 (×4): qty 1

## 2015-10-05 MED ORDER — DOCUSATE SODIUM 100 MG PO CAPS
100.0000 mg | ORAL_CAPSULE | Freq: Two times a day (BID) | ORAL | Status: DC
  Administered 2015-10-05 – 2015-10-07 (×4): 100 mg via ORAL
  Filled 2015-10-05 (×4): qty 1

## 2015-10-05 MED ORDER — SODIUM CHLORIDE 0.9 % IJ SOLN: 3.0000 mL | Freq: Two times a day (BID) | INTRAMUSCULAR | Status: DC

## 2015-10-05 MED ORDER — AMLODIPINE BESYLATE 5 MG PO TABS
5.0000 mg | ORAL_TABLET | Freq: Every day | ORAL | Status: DC
  Administered 2015-10-06: 5 mg via ORAL
  Filled 2015-10-05: qty 1

## 2015-10-05 MED ORDER — ALBUTEROL SULFATE (2.5 MG/3ML) 0.083% IN NEBU: 3.0000 mL | INHALATION_SOLUTION | Freq: Four times a day (QID) | RESPIRATORY_TRACT | Status: DC | PRN

## 2015-10-05 MED ORDER — SODIUM CHLORIDE 0.9 % IV SOLN
INTRAVENOUS | Status: DC
  Administered 2015-10-05 – 2015-10-07 (×3): via INTRAVENOUS

## 2015-10-05 MED ORDER — ONDANSETRON HCL 4 MG PO TABS: 4.0000 mg | ORAL_TABLET | Freq: Four times a day (QID) | ORAL | Status: DC | PRN

## 2015-10-05 MED ORDER — NITROGLYCERIN 0.4 MG SL SUBL: 0.4000 mg | SUBLINGUAL_TABLET | SUBLINGUAL | Status: DC | PRN

## 2015-10-05 MED ORDER — FUROSEMIDE 40 MG PO TABS
40.0000 mg | ORAL_TABLET | Freq: Every day | ORAL | Status: DC
  Administered 2015-10-06 – 2015-10-07 (×2): 40 mg via ORAL
  Filled 2015-10-05 (×2): qty 1

## 2015-10-05 MED ORDER — TIOTROPIUM BROMIDE MONOHYDRATE 18 MCG IN CAPS
18.0000 ug | ORAL_CAPSULE | Freq: Every day | RESPIRATORY_TRACT | Status: DC
  Administered 2015-10-06: 18 ug via RESPIRATORY_TRACT
  Filled 2015-10-05: qty 5

## 2015-10-05 MED ORDER — ACETAMINOPHEN 650 MG RE SUPP: 650.0000 mg | Freq: Four times a day (QID) | RECTAL | Status: DC | PRN

## 2015-10-05 MED ORDER — ACETAMINOPHEN 325 MG PO TABS: 650.0000 mg | ORAL_TABLET | Freq: Four times a day (QID) | ORAL | Status: DC | PRN

## 2015-10-05 MED ORDER — TAMSULOSIN HCL 0.4 MG PO CAPS
0.4000 mg | ORAL_CAPSULE | Freq: Every day | ORAL | Status: DC
  Administered 2015-10-06: 0.4 mg via ORAL
  Filled 2015-10-05: qty 1

## 2015-10-05 MED ORDER — FENTANYL CITRATE (PF) 100 MCG/2ML IJ SOLN
50.0000 ug | Freq: Once | INTRAMUSCULAR | Status: AC
  Administered 2015-10-05: 50 ug via INTRAVENOUS
  Filled 2015-10-05: qty 2

## 2015-10-05 MED ORDER — OXYCODONE HCL 5 MG PO TABS
5.0000 mg | ORAL_TABLET | ORAL | Status: DC | PRN
  Administered 2015-10-05 – 2015-10-07 (×8): 5 mg via ORAL
  Filled 2015-10-05 (×8): qty 1

## 2015-10-05 MED ORDER — ISOSORBIDE MONONITRATE ER 60 MG PO TB24
120.0000 mg | ORAL_TABLET | Freq: Every day | ORAL | Status: DC
  Administered 2015-10-06: 120 mg via ORAL
  Filled 2015-10-05: qty 2

## 2015-10-05 MED ORDER — ASPIRIN 81 MG PO CHEW
243.0000 mg | CHEWABLE_TABLET | Freq: Once | ORAL | Status: AC
  Administered 2015-10-05: 243 mg via ORAL
  Filled 2015-10-05: qty 3

## 2015-10-05 MED ORDER — LORATADINE 10 MG PO TABS
10.0000 mg | ORAL_TABLET | Freq: Every day | ORAL | Status: DC
  Administered 2015-10-06: 10 mg via ORAL
  Filled 2015-10-05: qty 1

## 2015-10-05 MED ORDER — INSULIN ASPART 100 UNIT/ML ~~LOC~~ SOLN
0.0000 [IU] | Freq: Three times a day (TID) | SUBCUTANEOUS | Status: DC
  Administered 2015-10-06 (×2): 1 [IU] via SUBCUTANEOUS
  Filled 2015-10-05 (×2): qty 1

## 2015-10-05 MED ORDER — OXYCODONE-ACETAMINOPHEN 5-325 MG PO TABS
1.0000 | ORAL_TABLET | ORAL | Status: DC | PRN
  Administered 2015-10-05 – 2015-10-07 (×8): 1 via ORAL
  Filled 2015-10-05 (×8): qty 1

## 2015-10-05 MED ORDER — LORATADINE 10 MG PO TABS: 10.0000 mg | ORAL_TABLET | Freq: Every day | ORAL | Status: DC

## 2015-10-05 MED ORDER — CLOPIDOGREL BISULFATE 75 MG PO TABS
75.0000 mg | ORAL_TABLET | Freq: Every day | ORAL | Status: DC
  Administered 2015-10-06: 75 mg via ORAL
  Filled 2015-10-05: qty 1

## 2015-10-05 MED ORDER — HYDROMORPHONE HCL 1 MG/ML IJ SOLN
0.5000 mg | INTRAMUSCULAR | Status: DC | PRN
  Administered 2015-10-05 – 2015-10-07 (×7): 0.5 mg via INTRAVENOUS
  Filled 2015-10-05 (×7): qty 1

## 2015-10-05 MED ORDER — LORATADINE 10 MG PO TABS
10.0000 mg | ORAL_TABLET | Freq: Once | ORAL | Status: AC
  Administered 2015-10-05: 10 mg via ORAL
  Filled 2015-10-05: qty 1

## 2015-10-05 MED ORDER — RANOLAZINE ER 500 MG PO TB12
1000.0000 mg | ORAL_TABLET | Freq: Two times a day (BID) | ORAL | Status: DC
  Administered 2015-10-05 – 2015-10-07 (×4): 1000 mg via ORAL
  Filled 2015-10-05 (×4): qty 2

## 2015-10-05 MED ORDER — OXYCODONE-ACETAMINOPHEN 10-325 MG PO TABS: 1.0000 | ORAL_TABLET | ORAL | Status: DC | PRN

## 2015-10-05 MED ORDER — LISINOPRIL 20 MG PO TABS
20.0000 mg | ORAL_TABLET | Freq: Every day | ORAL | Status: DC
  Administered 2015-10-06: 20 mg via ORAL
  Filled 2015-10-05: qty 1

## 2015-10-05 MED ORDER — ONDANSETRON HCL 4 MG/2ML IJ SOLN: 4.0000 mg | Freq: Four times a day (QID) | INTRAMUSCULAR | Status: DC | PRN

## 2015-10-05 MED ORDER — FENTANYL CITRATE (PF) 100 MCG/2ML IJ SOLN
25.0000 ug | INTRAMUSCULAR | Status: DC | PRN
  Administered 2015-10-05: 25 ug via INTRAVENOUS
  Filled 2015-10-05 (×2): qty 2

## 2015-10-05 MED ORDER — ASPIRIN EC 81 MG PO TBEC
81.0000 mg | DELAYED_RELEASE_TABLET | Freq: Every morning | ORAL | Status: DC
  Administered 2015-10-06: 81 mg via ORAL
  Filled 2015-10-05: qty 1

## 2015-10-05 MED ORDER — PANTOPRAZOLE SODIUM 40 MG PO TBEC
40.0000 mg | DELAYED_RELEASE_TABLET | Freq: Every day | ORAL | Status: DC
  Administered 2015-10-06: 40 mg via ORAL
  Filled 2015-10-05: qty 1

## 2015-10-05 MED ORDER — HEPARIN SODIUM (PORCINE) 5000 UNIT/ML IJ SOLN
5000.0000 [IU] | Freq: Three times a day (TID) | INTRAMUSCULAR | Status: DC
  Administered 2015-10-05 – 2015-10-07 (×5): 5000 [IU] via SUBCUTANEOUS
  Filled 2015-10-05 (×5): qty 1

## 2015-10-05 MED ORDER — POTASSIUM CHLORIDE CRYS ER 10 MEQ PO TBCR
10.0000 meq | EXTENDED_RELEASE_TABLET | Freq: Every day | ORAL | Status: DC
  Administered 2015-10-06 – 2015-10-07 (×2): 10 meq via ORAL
  Filled 2015-10-05 (×2): qty 1

## 2015-10-05 MED ORDER — ATORVASTATIN CALCIUM 20 MG PO TABS
80.0000 mg | ORAL_TABLET | Freq: Every day | ORAL | Status: DC
  Administered 2015-10-05 – 2015-10-06 (×2): 80 mg via ORAL
  Filled 2015-10-05 (×2): qty 4

## 2015-10-05 MED ORDER — CARVEDILOL 3.125 MG PO TABS
3.1250 mg | ORAL_TABLET | Freq: Every day | ORAL | Status: DC
  Administered 2015-10-06: 3.125 mg via ORAL
  Filled 2015-10-05: qty 1

## 2015-10-05 NOTE — ED Provider Notes (Signed)
[facility] Emergency Department Provider Note    ____________________________________________  Time seen: 1505  I have reviewed the triage vital signs and the nursing notes.   HISTORY  Chief Complaint Chest Pain   History limited by: Not Limited   HPI Lawrence [name] is a 59 y.o. male who presents to the emergency department today after an episode of chest pain. The pain was located on the left side and radiated into his left shoulder. It was pressure type. It started this morning. He stated he just stood up. Was associated with some shortness of breath and nausea. He states that he checked his blood pressure and his pulse rate at home. He noted his pulse rate was very high. He then went to ride aide which confirmed his high pulse rate. The patient tried taking some nitroglycerin at home without any relief. He denies any history of atrial fibrillation. He does have extensive coronary artery disease history including CABG.   Past Medical History  Diagnosis Date  . CAD (coronary artery disease)     a. 2002 CABGx2 (LIMA->LAD, VG->VG->OM1);  b. 09/2012 DES->OM;  c. 03/2015 PTCA of LAD [facility]) in setting of atretic LIMA; d. 05/2015 Cath [facility]): nonobs dzs; e. 06/2015 Cath ([facility]): LM nl, LAD 45p/d ISR, 50d, D1/2 small, LCX 50p/d ISR, OM1 70ost, 30 ISR, VG->OM1 50ost, 94m LIMA->LAD 99p/d - atretic, RCA dom, nl; f.cath 10/16: 40-50%(FFR 0.90) pLAD, 75% (FFR 0.77) mLAD s/p PCI/DES, oRCA 40% (FFR0.95)  . Essential hypertension   . COPD (chronic obstructive pulmonary disease) ([place]     a. Chronic bronchitis and emphysema.  . Type II diabetes mellitus ([place]   . Asthma   . Celiac disease   . Chronic diastolic CHF (congestive heart failure) ([place]     a. 06/2009 Echo: EF 60-65%, Gr 1 DD, triv AI, mildly dil LA, nl RV.  .Marland KitchenAnxiety   . History of tobacco abuse     a. Quit 2014.  .Marland KitchenPSVT (paroxysmal supraventricular tachycardia) ([place]     a. 10/2012 Noted on Zio Patch.     Patient Active Problem List   Diagnosis Date Noted  . Back pain with left-sided radiculopathy 09/30/2015  . Nocturnal hypoxia 09/06/2015  . BPH (benign prostatic hyperplasia) 08/01/2015  . Essential hypertension   . Chronic diastolic CHF (congestive heart failure) ([place]   . Angina at rest ([facility]   . Pain in the chest   . Chest pain 07/11/2015  . COPD (chronic obstructive pulmonary disease) ([place] 07/03/2015  . CAD (coronary artery disease) 06/26/2015  . HTN (hypertension) 06/26/2015  . DM (diabetes mellitus) ([place] 06/26/2015  . Achalasia 07/24/2014  . GERD (gastroesophageal reflux disease) 06/07/2014  . Former tobacco use 04/11/2013  . Chest pain at rest 04/09/2013  . HLD (hyperlipidemia) 04/09/2013    Past Surgical History  Procedure Laterality Date  . Vascular surgery    . Bypass graft    . Tonsillectomy    . Cholecystectomy    . Cardiac catheterization N/A 07/12/2015    rocedure: Left Heart Cath and Cors/Grafts Angiography;  Surgeon: HBelva Crome MD;  [facility];  Service: Cardiovascular;  Laterality: N/A;  . Esophageal dilation      Current Outpatient Rx  Name  Route  Sig  Dispense  Refill  . albuterol (PROVENTIL HFA;VENTOLIN HFA) 108 (90 BASE) MCG/ACT inhaler   Inhalation   Inhale 2 puffs into the lungs every 6 (six) hours as needed for wheezing.   1  Inhaler   0   . ALPRAZolam (XANAX) 1 MG tablet      TAKE 1/2-1 TABLET BY MOUTH 3 TIMES A DAY Patient taking differently: 1 TABLET BY MOUTH 3 TIMES DAILY   90 tablet   3   . amLODipine (NORVASC) 5 MG tablet   Oral   Take 5 mg by mouth daily.          Marland Kitchen aspirin EC 81 MG tablet   Oral   Take 81 mg by mouth every morning.         Marland Kitchen atorvastatin (LIPITOR) 80 MG tablet   Oral   Take 1 tablet (80 mg total) by mouth at bedtime.   90 tablet   3   . carvedilol (COREG) 3.125 MG tablet   Oral   Take 3.125 mg by mouth daily.         . cetirizine (ZYRTEC) 10 MG tablet   Oral   Take 10  mg by mouth at bedtime.          . clopidogrel (PLAVIX) 75 MG tablet      TAKE ONE (1) TABLET EACH DAY   30 tablet   5   . furosemide (LASIX) 40 MG tablet   Oral   Take 1 tablet (40 mg total) by mouth 2 (two) times daily. Patient taking differently: Take 40 mg by mouth daily.    60 tablet   0   . isosorbide mononitrate (IMDUR) 60 MG 24 hr tablet   Oral   Take 120 mg by mouth daily.          . lansoprazole (PREVACID) 30 MG capsule   Oral   Take 1 capsule (30 mg total) by mouth every morning.   30 capsule   12   . lisinopril (PRINIVIL,ZESTRIL) 20 MG tablet   Oral   Take 20 mg by mouth daily.         . metFORMIN (GLUCOPHAGE) 500 MG tablet   Oral   Take 1 tablet (500 mg total) by mouth every morning.   90 tablet   4   . nitroGLYCERIN (NITROSTAT) 0.4 MG SL tablet   Sublingual   Place 1 tablet (0.4 mg total) under the tongue every 5 (five) minutes as needed for chest pain. Every 5 minutes as needed for chest pain   30 tablet   1   . omega-3 acid ethyl esters (LOVAZA) 1 G capsule      TAKE FOUR CAPSULES BY MOUTH DAILY   120 capsule   12   . oxyCODONE-acetaminophen (PERCOCET) 10-325 MG tablet   Oral   Take 1 tablet by mouth every 4 (four) hours as needed for pain.   150 tablet   0   . potassium chloride (K-DUR,KLOR-CON) 10 MEQ tablet   Oral   Take 1 tablet (10 mEq total) by mouth 2 (two) times daily. Patient taking differently: Take 10 mEq by mouth daily.    60 tablet   0   . predniSONE (DELTASONE) 10 MG tablet      Take 6 Tablets for 2 days, 5 Tablets for 2 days, 4 Tablets for 2 days, 3 Tablets for 2 days, 2 Tablets for 2 days, 1 Tablet for 2 days   42 tablet   0   . ranolazine (RANEXA) 1000 MG SR tablet   Oral   Take 1 tablet (1,000 mg total) by mouth 2 (two) times daily.   60 tablet   2   . tamsulosin (  FLOMAX) 0.4 MG CAPS capsule   Oral   Take 0.4 mg by mouth daily.         Marland Kitchen tiotropium (SPIRIVA) 18 MCG inhalation capsule   Inhalation    Place 18 mcg into inhaler and inhale daily.           Allergies Albuterol sulfate; Demerol; Sulfa antibiotics; and Morphine sulfate  Family History  Problem Relation Age of Onset  . Heart attack Mother   . Cirrhosis Mother   . Diabetes Mother   . Depression Mother   . Heart disease Mother   . Heart attack Father   . Diabetes Father   . Depression Father   . Heart disease Father   . Parkinson's disease Brother     Social History Social History  Substance Use Topics  . Smoking status: Former Smoker    Quit date: 04/22/2013  . Smokeless tobacco: Never Used  . Alcohol Use: No     Comment: remotely quit alcohol use. Hx of heavy alcohol use.    Review of Systems  Constitutional: Negative for fever. Cardiovascular: positive for chest pain. Respiratory: positive for shortness of breath. Gastrointestinal: Negative for abdominal pain, vomiting and diarrhea. Genitourinary: Negative for dysuria. Musculoskeletal: Negative for back pain. Skin: Negative for rash. Neurological: Negative for headaches, focal weakness or numbness.   10-point ROS otherwise negative.  ____________________________________________   PHYSICAL EXAM:  VITAL SIGNS: ED Triage Vitals  Enc Vitals Group     BP 10/05/15 1358 96/78 mmHg     Pulse Rate 10/05/15 1358 89     Resp 10/05/15 1358 20     Temp 10/05/15 1358 97.7 F (36.5 C)     Temp Source 10/05/15 1358 Oral     SpO2 10/05/15 1358 97 %     Weight 10/05/15 1358 224 lb (101.606 kg)     Height 10/05/15 1358 5' 7"  (1.702 m)     Head Cir --      Peak Flow --      Pain Score 10/05/15 1359 8   Constitutional: Alert and oriented. Well appearing and in no distress. Eyes: Conjunctivae are normal. PERRL. Normal extraocular movements. ENT   Head: Normocephalic and atraumatic.   Nose: No congestion/rhinnorhea.   Mouth/Throat: Mucous membranes are moist.   Neck: No stridor. Hematological/Lymphatic/Immunilogical: No cervical  lymphadenopathy. Cardiovascular: Normal rate, regular rhythm.  No murmurs, rubs, or gallops. Respiratory: Normal respiratory effort without tachypnea nor retractions. Breath sounds are clear and equal bilaterally. No wheezes/rales/rhonchi. Gastrointestinal: Soft and nontender. No distention.  Genitourinary: Deferred Musculoskeletal: Normal range of motion in all extremities. No joint effusions.  No lower extremity tenderness nor edema. Neurologic:  Normal speech and language. No gross focal neurologic deficits are appreciated.  Skin:  Skin is warm, dry and intact. No rash noted. Psychiatric: Mood and affect are normal. Speech and behavior are normal. Patient exhibits appropriate insight and judgment.  ____________________________________________    LABS (pertinent positives/negatives)  Labs Reviewed  CBC - Abnormal; Notable for the following:    RDW 15.1 (*)    All other components within normal limits  COMPREHENSIVE METABOLIC PANEL - Abnormal; Notable for the following:    Glucose, Bld 156 (*)    BUN 24 (*)    Creatinine, Ser 1.27 (*)    GFR calc non Af Amer 59 (*)    All other components within normal limits  TROPONIN I     ____________________________________________   EKG  I, Nance Pear, attending physician, personally viewed and  interpreted this EKG  EKG Time: 1404 Rate: 135 Rhythm: atrial fibrillation with RVR Axis: normal Intervals: qtc 441 QRS: narrow ST changes: no st elevation Impression: abnormal ekg  I, Nance Pear, attending physician, personally viewed and interpreted this EKG  EKG Time: 1713 Rate: 68 Rhythm: Normal Sinus Rhythm Axis: normal Intervals: qtc 405 QRS: narrow, q waves V1,V2,V3 ST changes: no st eleavtion Impression: abnormal ekg ____________________________________________    RADIOLOGY  CXR IMPRESSION: No active disease.   ____________________________________________   PROCEDURES  Procedure(s) performed:  None  Critical Care performed: No  ____________________________________________   INITIAL IMPRESSION / ASSESSMENT AND PLAN / ED COURSE  Pertinent labs & imaging results that were available during my care of the patient were reviewed by me and considered in my medical decision making (see chart for details).  Patient presented to the emergency department today after an episode of chest pain and shortness breath. Initial EKG here showed patient to be in A. Fib with RVR however by the time my examination patient had spontaneously converted back to a normal sinus rhythm. Patient denies any history of A. Fib in the past however does have an extensive coronary artery disease history. First troponin here negative and both EKGs without any signs of ST elevation.given history however and new A. Fib will plan on admission.  ____________________________________________   FINAL CLINICAL IMPRESSION(S) / ED DIAGNOSES  Final diagnoses:  Chest pain, unspecified chest pain type  Atrial fibrillation with RVR ([place])     Nance Pear, MD 10/05/15 1950

## 2015-10-05 NOTE — ED Notes (Addendum)
Repeat EKG performed.

## 2015-10-05 NOTE — ED Notes (Signed)
States work up at [employer] last week for same

## 2015-10-05 NOTE — H&P (Signed)
History and Physical    Lawrence [name] Lawrence [name] DOB: 1956-09-25 DOA: 10/05/2015  Referring physician: Dr. Archie Balboa PCP: [name], MD  Specialists:none  Chief Complaint: chest pain  HPI: Lawrence [name] is a 59 y.o. male has a past medical history significant for CAD s/p stent placement 3 months ago and CABG on 2002 now with Cp radiating to left arm described as squeezing. Pain improved after meds in ER but still with some pain. No N/V, SOB, or diaphoresis. Also noted to be in A-fib upon arrival but now in NSR. He is now admitted for further evaluation.  Review of Systems: The patient denies anorexia, fever, weight loss,, vision loss, decreased hearing, hoarseness, syncope, dyspnea on exertion, peripheral edema, balance deficits, hemoptysis, abdominal pain, melena, hematochezia, severe indigestion/heartburn, hematuria, incontinence, genital sores, muscle weakness, suspicious skin lesions, transient blindness, difficulty walking, depression, unusual weight change, abnormal bleeding, enlarged lymph nodes, angioedema, and breast masses.   Past Medical History  Diagnosis Date  . CAD (coronary artery disease)     a. 2002 CABGx2 (LIMA->LAD, VG->VG->OM1);  b. 09/2012 DES->OM;  c. 03/2015 PTCA of LAD [facility]) in setting of atretic LIMA; d. 05/2015 Cath [facility]): nonobs dzs; e. 06/2015 Cath ([facility]): LM nl, LAD 45p/d ISR, 50d, D1/2 small, LCX 50p/d ISR, OM1 70ost, 30 ISR, VG->OM1 50ost, 57m LIMA->LAD 99p/d - atretic, RCA dom, nl; f.cath 10/16: 40-50%(FFR 0.90) pLAD, 75% (FFR 0.77) mLAD s/p PCI/DES, oRCA 40% (FFR0.95)  . Essential hypertension   . COPD (chronic obstructive pulmonary disease) ([place]     a. Chronic bronchitis and emphysema.  . Type II diabetes mellitus ([place]   . Asthma   . Celiac disease   . Chronic diastolic CHF (congestive heart failure) ([place]     a. 06/2009 Echo: EF 60-65%, Gr 1 DD, triv AI, mildly dil LA, nl RV.  .Marland KitchenAnxiety   . History of tobacco abuse     a. Quit 2014.  .Marland KitchenPSVT  (paroxysmal supraventricular tachycardia) ([facility]     a. 10/2012 Noted on Zio Patch.   Past Surgical History  Procedure Laterality Date  . Vascular surgery    . Bypass graft    . Tonsillectomy    . Cholecystectomy    . Cardiac catheterization N/A 07/12/2015    rocedure: Left Heart Cath and Cors/Grafts Angiography;  Surgeon: HBelva Crome MD;  [facility];  Service: Cardiovascular;  Laterality: N/A;  . Esophageal dilation     Social History:  reports that he quit smoking about 2 years ago. He has never used smokeless tobacco. He reports that he does not drink alcohol or use illicit drugs.  Allergies  Allergen Reactions  . Albuterol Sulfate [Albuterol] Other (See Comments)    Speeds heart rate up, but Xopenex wasn't effective so MD changed to Albuterol even though it causes palpatations  . Demerol [Meperidine] Hives  . Sulfa Antibiotics Hives  . Morphine Sulfate Rash    Nausea and vomiting    Family History  Problem Relation Age of Onset  . Heart attack Mother   . Cirrhosis Mother   . Diabetes Mother   . Depression Mother   . Heart disease Mother   . Heart attack Father   . Diabetes Father   . Depression Father   . Heart disease Father   . Parkinson's disease Brother     Prior to Admission medications   Medication Sig Start Date End Date Taking? Authorizing Provider  albuterol (PROVENTIL HFA;VENTOLIN HFA) 108 (90  BASE) MCG/ACT inhaler Inhale 2 puffs into the lungs every 6 (six) hours as needed for wheezing. 07/03/15   Birdie Sons, MD  ALPRAZolam Duanne Moron) 1 MG tablet TAKE 1/2-1 TABLET BY MOUTH 3 TIMES A DAY Patient taking differently: 1 TABLET BY MOUTH 3 TIMES DAILY 05/22/15   Birdie Sons, MD  amLODipine (NORVASC) 5 MG tablet Take 5 mg by mouth daily.  08/06/15   Historical Provider, MD  aspirin EC 81 MG tablet Take 81 mg by mouth every morning.    Historical Provider, MD  atorvastatin (LIPITOR) 80 MG tablet Take 1 tablet (80 mg total) by mouth at bedtime.  04/25/15   Birdie Sons, MD  carvedilol (COREG) 3.125 MG tablet Take 3.125 mg by mouth daily.    Historical Provider, MD  cetirizine (ZYRTEC) 10 MG tablet Take 10 mg by mouth at bedtime.     Historical Provider, MD  clopidogrel (PLAVIX) 75 MG tablet TAKE ONE (1) TABLET EACH DAY 04/22/15   Birdie Sons, MD  furosemide (LASIX) 40 MG tablet Take 1 tablet (40 mg total) by mouth 2 (two) times daily. Patient taking differently: Take 40 mg by mouth daily.  08/02/15   Loletha Grayer, MD  isosorbide mononitrate (IMDUR) 60 MG 24 hr tablet Take 120 mg by mouth daily.  06/12/15   Historical Provider, MD  lansoprazole (PREVACID) 30 MG capsule Take 1 capsule (30 mg total) by mouth every morning. 10/01/15   Birdie Sons, MD  lisinopril (PRINIVIL,ZESTRIL) 20 MG tablet Take 20 mg by mouth daily.    Historical Provider, MD  metFORMIN (GLUCOPHAGE) 500 MG tablet Take 1 tablet (500 mg total) by mouth every morning. 04/25/15   Birdie Sons, MD  nitroGLYCERIN (NITROSTAT) 0.4 MG SL tablet Place 1 tablet (0.4 mg total) under the tongue every 5 (five) minutes as needed for chest pain. Every 5 minutes as needed for chest pain 04/26/15   Birdie Sons, MD  omega-3 acid ethyl esters (LOVAZA) 1 G capsule TAKE FOUR CAPSULES BY MOUTH DAILY 07/01/15   Birdie Sons, MD  oxyCODONE-acetaminophen (PERCOCET) 10-325 MG tablet Take 1 tablet by mouth every 4 (four) hours as needed for pain. 09/30/15   Birdie Sons, MD  potassium chloride (K-DUR,KLOR-CON) 10 MEQ tablet Take 1 tablet (10 mEq total) by mouth 2 (two) times daily. Patient taking differently: Take 10 mEq by mouth daily.  08/02/15   Loletha Grayer, MD  predniSONE (DELTASONE) 10 MG tablet Take 6 Tablets for 2 days, 5 Tablets for 2 days, 4 Tablets for 2 days, 3 Tablets for 2 days, 2 Tablets for 2 days, 1 Tablet for 2 days 10/01/15   Birdie Sons, MD  ranolazine (RANEXA) 1000 MG SR tablet Take 1 tablet (1,000 mg total) by mouth 2 (two) times daily. 09/13/15   Gladstone Lighter, MD  tamsulosin (FLOMAX) 0.4 MG CAPS capsule Take 0.4 mg by mouth daily.    Historical Provider, MD  tiotropium (SPIRIVA) 18 MCG inhalation capsule Place 18 mcg into inhaler and inhale daily.    Historical Provider, MD   Physical Exam: Filed Vitals:   10/05/15 1358 10/05/15 1641 10/05/15 1700  BP: 96/78 128/81 114/78  Pulse: 89 71 72  Temp: 97.7 F (36.5 C)    TempSrc: Oral    Resp: 20 17 14   Height: 5' 7"  (1.702 m)    Weight: 101.606 kg (224 lb)    SpO2: 97% 98% 95%     General:  No apparent distress  Eyes: PERRL, EOMI, no scleral icterus  ENT: moist oropharynx  Neck: supple, no lymphadenopathy  Cardiovascular: regular rate without MRG; 2+ peripheral pulses, no JVD, trace peripheral edema  Respiratory: CTA biL, good air movement without wheezing, rhonchi or crackled  Abdomen: soft, non tender to palpation, positive bowel sounds, no guarding, no rebound  Skin: no rashes  Musculoskeletal: normal bulk and tone, no joint swelling  Psychiatric: normal mood and affect  Neurologic: CN 2-12 grossly intact, MS 5/5 in all 4  Labs on Admission:  Basic Metabolic Panel:  Recent Labs Lab 10/05/15 1404  NA 139  K 4.8  CL 105  CO2 27  GLUCOSE 156*  BUN 24*  CREATININE 1.27*  CALCIUM 9.4   Liver Function Tests:  Recent Labs Lab 10/05/15 1404  AST 22  ALT 20  ALKPHOS 54  BILITOT 0.8  PROT 6.6  ALBUMIN 3.5   No results for input(s): LIPASE, AMYLASE in the last 168 hours. No results for input(s): AMMONIA in the last 168 hours. CBC:  Recent Labs Lab 10/05/15 1404  WBC 7.6  HGB 14.0  HCT 42.4  MCV 85.2  PLT 230   Cardiac Enzymes:  Recent Labs Lab 10/05/15 1404  TROPONINI <0.03    BNP (last 3 results)  Recent Labs  02/26/15 2214 06/25/15 2215 09/12/15 1801  BNP 18 68.0 20.0    ProBNP (last 3 results) No results for input(s): PROBNP in the last 8760 hours.  CBG: No results for input(s): GLUCAP in the last 168  hours.  Radiological Exams on Admission: Dg Chest Portable 1 View  10/05/2015  CLINICAL DATA:  Chest pain this morning.  Shortness of breath. EXAM: PORTABLE CHEST 1 VIEW COMPARISON:  09/12/2015 FINDINGS: There is no focal parenchymal opacity. There is no pleural effusion or pneumothorax. The heart and mediastinal contours are unremarkable. There is evidence of prior CABG. The osseous structures are unremarkable. IMPRESSION: No active disease. Electronically Signed   By: Kathreen Devoid   On: 10/05/2015 17:39    EKG: Independently reviewed.  Assessment/Plan Principal Problem:   Unstable angina (HCC) Active Problems:   PAF (paroxysmal atrial fibrillation) ([place])   Will admit to telemetry and follow enzymes. Check TSH and magnesium. Order echo and Cardiology consult. Repeat routine labs in AM.  Diet: low salt Fluids: NS@75  DVT Prophylaxis: SQ Heparin  Code Status: FULL  Family Communication: none  Disposition Plan: home  Time spent: 50 min

## 2015-10-05 NOTE — Progress Notes (Signed)
Lawrence [name] is a 59 y.o. male patient admitted from ED awake, alert - oriented  X 4 - no acute distress noted.  VSS - Blood pressure 133/83, pulse 60, temperature 97.5 F (36.4 C), temperature source Oral, resp. rate 18, height 5' 7"  (1.702 m), weight 101.606 kg (224 lb), SpO2 93 %.    IV in place, occlusive dsg intact without redness. NS started at 69m/h per MD order.  Orientation to room, and floor completed with information packet given to patient/family.  Patient declined safety video at this time.  Admission INP armband ID verified with patient/family, and in place.   SR up x 2, fall assessment complete, with patient and family able to verbalize understanding of risk associated with falls, and verbalized understanding to call nsg before up out of bed.  Call light within reach, patient able to voice, and demonstrate understanding.  Skin, clean-dry- intact without evidence of skin breakdown. Bruising to bilat legs and back. Skin assessed with REtter Sjogren, RN.    Will cont to eval and treat per MD orders.  CRachael Fee RN 10/05/2015

## 2015-10-05 NOTE — ED Notes (Signed)
Pt offered a Nitro SL but pt states that the Nitro doesn't really help him. Pt transported to floor at this time.

## 2015-10-05 NOTE — ED Notes (Signed)
Pt states that around 9 AM this morning he stood up and felt room start spinning and became nausea. Pt states he felt SOB. Pt took BP and noticed that it was elevated and pulse was around 128. Pt states that he is now having pain in left side of chest that radiates to left arm at times. Pt describes the pain as squeezing and states it is constant. Pt has 3 cardiac stents and had double bypass in 2002.

## 2015-10-06 ENCOUNTER — Inpatient Hospital Stay
Admit: 2015-10-06 | Discharge: 2015-10-06 | Disposition: A | Discharge status: Home / Self Care | Payer: Medicare PPO | Attending: Internal Medicine | Admitting: Internal Medicine

## 2015-10-06 LAB — COMPREHENSIVE METABOLIC PANEL
ALT: 16 U/L — ABNORMAL LOW (ref 17–63)
AST: 16 U/L (ref 15–41)
Albumin: 2.9 g/dL — ABNORMAL LOW (ref 3.5–5.0)
Alkaline Phosphatase: 42 U/L (ref 38–126)
Anion gap: 3 — ABNORMAL LOW (ref 5–15)
BUN: 20 mg/dL (ref 6–20)
CO2: 29 mmol/L (ref 22–32)
Calcium: 8.6 mg/dL — ABNORMAL LOW (ref 8.9–10.3)
Chloride: 105 mmol/L (ref 101–111)
Creatinine, Ser: 1.06 mg/dL (ref 0.61–1.24)
GFR calc Af Amer: >60 mL/min (ref >60)
GFR calc non Af Amer: >60 mL/min (ref >60)
Glucose, Bld: 118 mg/dL — ABNORMAL HIGH (ref 65–99)
Potassium: 4.1 mmol/L (ref 3.5–5.1)
Sodium: 137 mmol/L (ref 135–145)
Total Bilirubin: 0.7 mg/dL (ref 0.3–1.2)
Total Protein: 5.2 g/dL — ABNORMAL LOW (ref 6.5–8.1)

## 2015-10-06 LAB — CBC
HCT: 37 % — ABNORMAL LOW (ref 40.0–52.0)
Hemoglobin: 12.1 g/dL — ABNORMAL LOW (ref 13.0–18.0)
MCH: 28.1 pg (ref 26.0–34.0)
MCHC: 32.8 g/dL (ref 32.0–36.0)
MCV: 85.8 fL (ref 80.0–100.0)
Platelets: 191 10*3/uL (ref 150–440)
RBC: 4.32 MIL/uL — ABNORMAL LOW (ref 4.40–5.90)
RDW: 15.3 % — ABNORMAL HIGH (ref 11.5–14.5)
WBC: 6.8 10*3/uL (ref 3.8–10.6)

## 2015-10-06 LAB — TROPONIN I
Troponin I: <0.03 ng/mL (ref <0.031)
Troponin I: <0.03 ng/mL (ref <0.031)

## 2015-10-06 LAB — GLUCOSE, CAPILLARY
Glucose-Capillary: 138 mg/dL — ABNORMAL HIGH (ref 65–99)
Glucose-Capillary: 112 mg/dL — ABNORMAL HIGH (ref 65–99)
Glucose-Capillary: 127 mg/dL — ABNORMAL HIGH (ref 65–99)
Glucose-Capillary: 92 mg/dL (ref 65–99)

## 2015-10-06 LAB — PROTIME-INR
INR: 1.08
Prothrombin Time: 14.2 seconds (ref 11.4–15.0)

## 2015-10-06 MED ORDER — ASPIRIN 81 MG PO CHEW
81.0000 mg | CHEWABLE_TABLET | ORAL | Status: AC
  Administered 2015-10-07: 81 mg via ORAL
  Filled 2015-10-06 (×2): qty 1

## 2015-10-06 MED ORDER — SODIUM CHLORIDE 0.9 % IJ SOLN
3.0000 mL | Freq: Two times a day (BID) | INTRAMUSCULAR | Status: DC
  Administered 2015-10-06: 3 mL via INTRAVENOUS

## 2015-10-06 MED ORDER — SODIUM CHLORIDE 0.9 % IJ SOLN: 3.0000 mL | INTRAMUSCULAR | Status: DC | PRN

## 2015-10-06 MED ORDER — SODIUM CHLORIDE 0.9 % IV SOLN: 250.0000 mL | INTRAVENOUS | Status: DC | PRN

## 2015-10-06 MED ORDER — NITROGLYCERIN 0.4 MG SL SUBL: 0.4000 mg | SUBLINGUAL_TABLET | SUBLINGUAL | Status: DC | PRN

## 2015-10-06 MED ORDER — SODIUM CHLORIDE 0.9 % WEIGHT BASED INFUSION: 1.0000 mL/kg/h | INTRAVENOUS | Status: DC

## 2015-10-06 MED ORDER — SODIUM CHLORIDE 0.9 % WEIGHT BASED INFUSION: 3.0000 mL/kg/h | INTRAVENOUS | Status: AC

## 2015-10-06 NOTE — Progress Notes (Signed)
*  PRELIMINARY RESULTS* Echocardiogram 2D Echocardiogram has been performed.  Lawrence [name] 10/06/2015, 9:24 AM

## 2015-10-06 NOTE — Progress Notes (Signed)
[facility] [place] NAME: Lawrence [name]    MR#:  [id number]  DATE OF BIRTH:  21-Jun-1956  SUBJECTIVE:  CHIEF COMPLAINT:   Chief Complaint  Patient presents with  . Chest Pain    L chest pain since this am  Hx of CAD- and stents- took his meds regularly. Still some pain.  REVIEW OF SYSTEMS:  CONSTITUTIONAL: No fever, fatigue or weakness.  EYES: No blurred or double vision.  EARS, NOSE, AND THROAT: No tinnitus or ear pain.  RESPIRATORY: No cough, shortness of breath, wheezing or hemoptysis.  CARDIOVASCULAR:positive chest pain, orthopnea, edema.  GASTROINTESTINAL: No nausea, vomiting, diarrhea or abdominal pain.  GENITOURINARY: No dysuria, hematuria.  ENDOCRINE: No polyuria, nocturia,  HEMATOLOGY: No anemia, easy bruising or bleeding SKIN: No rash or lesion. MUSCULOSKELETAL: No joint pain or arthritis.   NEUROLOGIC: No tingling, numbness, weakness.  PSYCHIATRY: No anxiety or depression.   ROS  DRUG ALLERGIES:   Allergies  Allergen Reactions  . Albuterol Sulfate [Albuterol] Other (See Comments)    Speeds heart rate up, but Xopenex wasn't effective so MD changed to Albuterol even though it causes palpitations.  . Demerol [Meperidine] Hives  . Sulfa Antibiotics Hives  . Morphine Sulfate Nausea And Vomiting, Rash and Other (See Comments)    Patient states he is allergic only to tablet form.    VITALS:  Blood pressure 103/61, pulse 56, temperature 98 F (36.7 C), temperature source Oral, resp. rate 18, height 5' 7"  (1.702 m), weight 102.195 kg (225 lb 4.8 oz), SpO2 92 %.  PHYSICAL EXAMINATION:  GENERAL:  59 y.o.-year-old patient lying in the bed with no acute distress.  EYES: Pupils equal, round, reactive to light and accommodation. No scleral icterus. Extraocular muscles intact.  HEENT: Head atraumatic, normocephalic. Oropharynx and nasopharynx clear.  NECK:  Supple, no jugular venous distention. No thyroid enlargement, no  tenderness.  LUNGS: Normal breath sounds bilaterally, no wheezing, rales,rhonchi or crepitation. No use of accessory muscles of respiration.  CARDIOVASCULAR: S1, S2 normal. No murmurs, rubs, or gallops.  ABDOMEN: Soft, nontender, nondistended. Bowel sounds present. No organomegaly or mass.  EXTREMITIES: No pedal edema, cyanosis, or clubbing.  NEUROLOGIC: Cranial nerves II through XII are intact. Muscle strength 5/5 in all extremities. Sensation intact. Gait not checked.  PSYCHIATRIC: The patient is alert and oriented x 3.  SKIN: No obvious rash, lesion, or ulcer.   Physical Exam LABORATORY PANEL:   CBC  Recent Labs Lab 10/06/15 0151  WBC 6.8  HGB 12.1*  HCT 37.0*  PLT 191   ------------------------------------------------------------------------------------------------------------------  Chemistries   Recent Labs Lab 10/05/15 1404 10/06/15 0151  NA 139 137  K 4.8 4.1  CL 105 105  CO2 27 29  GLUCOSE 156* 118*  BUN 24* 20  CREATININE 1.27* 1.06  CALCIUM 9.4 8.6*  MG 1.9  --   AST 22 16  ALT 20 16*  ALKPHOS 54 42  BILITOT 0.8 0.7   ------------------------------------------------------------------------------------------------------------------  Cardiac Enzymes  Recent Labs Lab 10/06/15 0151 10/06/15 0734  TROPONINI <0.03 <0.03   ------------------------------------------------------------------------------------------------------------------  RADIOLOGY:  Dg Chest Portable 1 View  10/05/2015  CLINICAL DATA:  Chest pain this morning.  Shortness of breath. EXAM: PORTABLE CHEST 1 VIEW COMPARISON:  09/12/2015 FINDINGS: There is no focal parenchymal opacity. There is no pleural effusion or pneumothorax. The heart and mediastinal contours are unremarkable. There is evidence of prior CABG. The osseous structures are unremarkable. IMPRESSION: No active disease. Electronically Signed  By: Kathreen Devoid   On: 10/05/2015 17:39    ASSESSMENT AND PLAN:   Principal  Problem:   Unstable angina ([place]) Active Problems:   PAF (paroxysmal atrial fibrillation) ([place])  * Unstable angina with Hx of CAD and stent   Appreciated cardiology help, cath tomorrow.   Cont ASA, statin, Coreg, plavix., Imdur.  * Htn  Lisinopril, coreg, Imdur.  * Diabetes   ISS>  * BPH   Flomax.  * hyperlipidemia   Atorvastatin.  All the records are reviewed and case discussed with Care Management/Social Workerr. Management plans discussed with the patient, family and they are in agreement.  CODE STATUS: full  TOTAL TIME TAKING CARE OF THIS PATIENT: 35 minutes.     POSSIBLE D/C IN 1-2  DAYS, DEPENDING ON CLINICAL CONDITION.   Vaughan Basta M.D on 10/06/2015   Between 7am to 6pm - Pager - [phone]  After 6pm go to [email or web address] - password [email or web address] [place] [place] [facility]  Office  [phone]  CC: Primary care physician; [name], MD  Note: This dictation was prepared with Dragon dictation along with smaller phrase technology. Any transcriptional errors that result from this process are unintentional.

## 2015-10-06 NOTE — Progress Notes (Signed)
Patient has been in bed resting well. Patient has been having some complaints of chronic back pain and some mild chest soreness that is relieved with prn meds. Patient had no complaints of N/V/D.

## 2015-10-06 NOTE — Progress Notes (Signed)
Lawrence [name] is a 59 y.o. male  [id number]  Primary Cardiologist: Neoma Laming Reason for Consultation: Chest pain  HPI: This is a 59 year old white male with a past medical history of CABG in 2002 after having a myocardial infarction at [facility]. He had 2 vessel CABG with LIMA to the LAD and SVG to OM1. Since then he had a drug-eluting stent deployed in 11 2013 and OM1 and had PTCA of the LAD after LIMA being became atretic in May 2016. Patient states that he's been having chest pain with associated shortness of breath since yesterday. He still having chest pain this morning described as pressure type associated with shortness of breath and diaphoresis   Review of Systems: No PND or orthopnea or leg swelling   Past Medical History  Diagnosis Date  . CAD (coronary artery disease)     a. 2002 CABGx2 (LIMA->LAD, VG->VG->OM1);  b. 09/2012 DES->OM;  c. 03/2015 PTCA of LAD [facility]) in setting of atretic LIMA; d. 05/2015 Cath [facility]): nonobs dzs; e. 06/2015 Cath ([facility]): LM nl, LAD 45p/d ISR, 50d, D1/2 small, LCX 50p/d ISR, OM1 70ost, 30 ISR, VG->OM1 50ost, 22m LIMA->LAD 99p/d - atretic, RCA dom, nl; f.cath 10/16: 40-50%(FFR 0.90) pLAD, 75% (FFR 0.77) mLAD s/p PCI/DES, oRCA 40% (FFR0.95)  . Essential hypertension   . COPD (chronic obstructive pulmonary disease) ([place]     a. Chronic bronchitis and emphysema.  . Type II diabetes mellitus ([place]   . Asthma   . Celiac disease   . Chronic diastolic CHF (congestive heart failure) ([facility]     a. 06/2009 Echo: EF 60-65%, Gr 1 DD, triv AI, mildly dil LA, nl RV.  .Marland KitchenAnxiety   . History of tobacco abuse     a. Quit 2014.  .Marland KitchenPSVT (paroxysmal supraventricular tachycardia) ([place]     a. 10/2012 Noted on Zio Patch.    Medications Prior to Admission  Medication Sig Dispense Refill  . albuterol (PROVENTIL HFA;VENTOLIN HFA) 108 (90 BASE) MCG/ACT inhaler Inhale 2 puffs into the lungs every 6 (six) hours as needed for wheezing. 1 Inhaler 0  . ALPRAZolam  (XANAX) 1 MG tablet TAKE 1/2-1 TABLET BY MOUTH 3 TIMES A DAY (Patient taking differently: 1 TABLET BY MOUTH 3 TIMES DAILY) 90 tablet 3  . amLODipine (NORVASC) 5 MG tablet Take 5 mg by mouth daily.     .Marland Kitchenaspirin EC 81 MG tablet Take 81 mg by mouth every morning.    .Marland Kitchenatorvastatin (LIPITOR) 80 MG tablet Take 1 tablet (80 mg total) by mouth at bedtime. 90 tablet 3  . carvedilol (COREG) 3.125 MG tablet Take 3.125 mg by mouth 2 (two) times daily with a meal.     . cetirizine (ZYRTEC) 10 MG tablet Take 10 mg by mouth at bedtime.     . clopidogrel (PLAVIX) 75 MG tablet TAKE ONE (1) TABLET EACH DAY 30 tablet 5  . furosemide (LASIX) 40 MG tablet Take 1 tablet (40 mg total) by mouth 2 (two) times daily. (Patient taking differently: Take 40 mg by mouth daily. ) 60 tablet 0  . isosorbide mononitrate (IMDUR) 60 MG 24 hr tablet Take 120 mg by mouth daily.     . lansoprazole (PREVACID) 30 MG capsule Take 1 capsule (30 mg total) by mouth every morning. 30 capsule 12  . lisinopril (PRINIVIL,ZESTRIL) 20 MG tablet Take 20 mg by mouth daily.    . metFORMIN (GLUCOPHAGE) 500 MG tablet Take 1 tablet (500 mg total) by  mouth every morning. 90 tablet 4  . nitroGLYCERIN (NITROSTAT) 0.4 MG SL tablet Place 1 tablet (0.4 mg total) under the tongue every 5 (five) minutes as needed for chest pain. Every 5 minutes as needed for chest pain 30 tablet 1  . omega-3 acid ethyl esters (LOVAZA) 1 G capsule TAKE FOUR CAPSULES BY MOUTH DAILY 120 capsule 12  . oxyCODONE-acetaminophen (PERCOCET) 10-325 MG tablet Take 1 tablet by mouth every 4 (four) hours as needed for pain. 150 tablet 0  . potassium chloride (K-DUR,KLOR-CON) 10 MEQ tablet Take 1 tablet (10 mEq total) by mouth 2 (two) times daily. (Patient taking differently: Take 10 mEq by mouth daily. ) 60 tablet 0  . predniSONE (DELTASONE) 10 MG tablet Take 6 Tablets for 2 days, 5 Tablets for 2 days, 4 Tablets for 2 days, 3 Tablets for 2 days, 2 Tablets for 2 days, 1 Tablet for 2 days 42  tablet 0  . ranolazine (RANEXA) 1000 MG SR tablet Take 1 tablet (1,000 mg total) by mouth 2 (two) times daily. 60 tablet 2  . tamsulosin (FLOMAX) 0.4 MG CAPS capsule Take 0.4 mg by mouth daily.    Marland Kitchen tiotropium (SPIRIVA) 18 MCG inhalation capsule Place 18 mcg into inhaler and inhale daily.       Marland Kitchen ALPRAZolam  1 mg Oral TID  . amLODipine  5 mg Oral Daily  . aspirin EC  81 mg Oral q morning - 10a  . atorvastatin  80 mg Oral QHS  . carvedilol  3.125 mg Oral Daily  . clopidogrel  75 mg Oral Daily  . docusate sodium  100 mg Oral BID  . furosemide  40 mg Oral Daily  . heparin  5,000 Units Subcutaneous 3 times per day  . insulin aspart  0-9 Units Subcutaneous TID WC  . isosorbide mononitrate  120 mg Oral Daily  . lisinopril  20 mg Oral Daily  . loratadine  10 mg Oral QHS  . pantoprazole  40 mg Oral QAC breakfast  . potassium chloride  10 mEq Oral Daily  . ranolazine  1,000 mg Oral BID  . sodium chloride  3 mL Intravenous Q12H  . tamsulosin  0.4 mg Oral Daily  . tiotropium  18 mcg Inhalation Daily    Infusions: . sodium chloride 75 mL/hr at 10/06/15 1131    Allergies  Allergen Reactions  . Albuterol Sulfate [Albuterol] Other (See Comments)    Speeds heart rate up, but Xopenex wasn't effective so MD changed to Albuterol even though it causes palpitations.  . Demerol [Meperidine] Hives  . Sulfa Antibiotics Hives  . Morphine Sulfate Nausea And Vomiting, Rash and Other (See Comments)    Patient states he is allergic only to tablet form.    Social History   Social History  . Marital Status: Married    Spouse Name: N/A  . Number of Children: N/A  . Years of Education: N/A   Occupational History  . Disabled    Social History Main Topics  . Smoking status: Former Smoker    Quit date: 04/22/2013  . Smokeless tobacco: Never Used  . Alcohol Use: No     Comment: remotely quit alcohol use. Hx of heavy alcohol use.  . Drug Use: No  . Sexual Activity: Not on file   Other Topics  Concern  . Not on file   Social History Narrative   Pt lives in [place] with wife.  Does not routinely exercise.    Family History  Problem Relation  Age of Onset  . Heart attack Mother   . Cirrhosis Mother   . Diabetes Mother   . Depression Mother   . Heart disease Mother   . Heart attack Father   . Diabetes Father   . Depression Father   . Heart disease Father   . Parkinson's disease Brother     PHYSICAL EXAM: Filed Vitals:   10/06/15 0942 10/06/15 1142  BP: 125/69 110/65  Pulse: 64 51  Temp:  98.1 F (36.7 C)  Resp:  18     Intake/Output Summary (Last 24 hours) at 10/06/15 1201 Last data filed at 10/06/15 0830  Gross per 24 hour  Intake  767.5 ml  Output    450 ml  Net  317.5 ml    General:  Well appearing. No respiratory difficulty HEENT: normal Neck: supple. no JVD. Carotids 2+ bilat; no bruits. No lymphadenopathy or thryomegaly appreciated. Cor: PMI nondisplaced. Regular rate & rhythm. No rubs, gallops or murmurs. Lungs: clear Abdomen: soft, nontender, nondistended. No hepatosplenomegaly. No bruits or masses. Good bowel sounds. Extremities: no cyanosis, clubbing, rash, edema Neuro: alert & oriented x 3, cranial nerves grossly intact. moves all 4 extremities w/o difficulty. Affect pleasant.  ECG: Sinus rhythm 68 bpm old anteroseptal wall MI no acute changes  Results for orders placed or performed during the hospital encounter of 10/05/15 (from the past 24 hour(s))  CBC     Status: Abnormal   Collection Time: 10/05/15  2:04 PM  Result Value Ref Range   WBC 7.6 3.8 - 10.6 K/uL   RBC 4.98 4.40 - 5.90 MIL/uL   Hemoglobin 14.0 13.0 - 18.0 g/dL   HCT 42.4 40.0 - 52.0 %   MCV 85.2 80.0 - 100.0 fL   MCH 28.2 26.0 - 34.0 pg   MCHC 33.1 32.0 - 36.0 g/dL   RDW 15.1 (H) 11.5 - 14.5 %   Platelets 230 150 - 440 K/uL  Comprehensive metabolic panel     Status: Abnormal   Collection Time: 10/05/15  2:04 PM  Result Value Ref Range   Sodium 139 135 - 145 mmol/L    Potassium 4.8 3.5 - 5.1 mmol/L   Chloride 105 101 - 111 mmol/L   CO2 27 22 - 32 mmol/L   Glucose, Bld 156 (H) 65 - 99 mg/dL   BUN 24 (H) 6 - 20 mg/dL   Creatinine, Ser 1.27 (H) 0.61 - 1.24 mg/dL   Calcium 9.4 8.9 - 10.3 mg/dL   Total Protein 6.6 6.5 - 8.1 g/dL   Albumin 3.5 3.5 - 5.0 g/dL   AST 22 15 - 41 U/L   ALT 20 17 - 63 U/L   Alkaline Phosphatase 54 38 - 126 U/L   Total Bilirubin 0.8 0.3 - 1.2 mg/dL   GFR calc non Af Amer 59 (L) >60 mL/min   GFR calc Af Amer >60 >60 mL/min   Anion gap 7 5 - 15  Troponin I     Status: None   Collection Time: 10/05/15  2:04 PM  Result Value Ref Range   Troponin I <0.03 <0.031 ng/mL  TSH     Status: None   Collection Time: 10/05/15  2:04 PM  Result Value Ref Range   TSH 0.410 0.350 - 4.500 uIU/mL  Magnesium     Status: None   Collection Time: 10/05/15  2:04 PM  Result Value Ref Range   Magnesium 1.9 1.7 - 2.4 mg/dL  Troponin I     Status: None  Collection Time: 10/05/15  8:22 PM  Result Value Ref Range   Troponin I <0.03 <0.031 ng/mL  Glucose, capillary     Status: Abnormal   Collection Time: 10/05/15  9:26 PM  Result Value Ref Range   Glucose-Capillary 166 (H) 65 - 99 mg/dL  Troponin I     Status: None   Collection Time: 10/06/15  1:51 AM  Result Value Ref Range   Troponin I <0.03 <0.031 ng/mL  CBC     Status: Abnormal   Collection Time: 10/06/15  1:51 AM  Result Value Ref Range   WBC 6.8 3.8 - 10.6 K/uL   RBC 4.32 (L) 4.40 - 5.90 MIL/uL   Hemoglobin 12.1 (L) 13.0 - 18.0 g/dL   HCT 37.0 (L) 40.0 - 52.0 %   MCV 85.8 80.0 - 100.0 fL   MCH 28.1 26.0 - 34.0 pg   MCHC 32.8 32.0 - 36.0 g/dL   RDW 15.3 (H) 11.5 - 14.5 %   Platelets 191 150 - 440 K/uL  Comprehensive metabolic panel     Status: Abnormal   Collection Time: 10/06/15  1:51 AM  Result Value Ref Range   Sodium 137 135 - 145 mmol/L   Potassium 4.1 3.5 - 5.1 mmol/L   Chloride 105 101 - 111 mmol/L   CO2 29 22 - 32 mmol/L   Glucose, Bld 118 (H) 65 - 99 mg/dL   BUN 20  6 - 20 mg/dL   Creatinine, Ser 1.06 0.61 - 1.24 mg/dL   Calcium 8.6 (L) 8.9 - 10.3 mg/dL   Total Protein 5.2 (L) 6.5 - 8.1 g/dL   Albumin 2.9 (L) 3.5 - 5.0 g/dL   AST 16 15 - 41 U/L   ALT 16 (L) 17 - 63 U/L   Alkaline Phosphatase 42 38 - 126 U/L   Total Bilirubin 0.7 0.3 - 1.2 mg/dL   GFR calc non Af Amer >60 >60 mL/min   GFR calc Af Amer >60 >60 mL/min   Anion gap 3 (L) 5 - 15  Troponin I     Status: None   Collection Time: 10/06/15  7:34 AM  Result Value Ref Range   Troponin I <0.03 <0.031 ng/mL  Glucose, capillary     Status: Abnormal   Collection Time: 10/06/15  8:08 AM  Result Value Ref Range   Glucose-Capillary 138 (H) 65 - 99 mg/dL   Comment 1 Notify RN    Dg Chest Portable 1 View  10/05/2015  CLINICAL DATA:  Chest pain this morning.  Shortness of breath. EXAM: PORTABLE CHEST 1 VIEW COMPARISON:  09/12/2015 FINDINGS: There is no focal parenchymal opacity. There is no pleural effusion or pneumothorax. The heart and mediastinal contours are unremarkable. There is evidence of prior CABG. The osseous structures are unremarkable. IMPRESSION: No active disease. Electronically Signed   By: Kathreen Devoid   On: 10/05/2015 17:39     ASSESSMENT AND PLAN: Unstable angina with acute coronary syndrome but negative cardiac enzymes and no significant EKG changes. He could be a candidate for CTA coronaries in the office but continues to have chest pain with the right coronary having moderate disease in the past we will consider cardiac catheterization.  Bobette Leyh A

## 2015-10-07 ENCOUNTER — Encounter: Payer: Self-pay | Admitting: *Deleted

## 2015-10-07 ENCOUNTER — Encounter
Admission: EM | Disposition: A | Discharge status: Home / Self Care | Payer: Self-pay | Source: Home / Self Care | Attending: Internal Medicine

## 2015-10-07 DIAGNOSIS — I249 Acute ischemic heart disease, unspecified: Secondary | ICD-10-CM | POA: Diagnosis present

## 2015-10-07 HISTORY — PX: CARDIAC CATHETERIZATION: SHX172

## 2015-10-07 LAB — GLUCOSE, CAPILLARY
Glucose-Capillary: 93 mg/dL (ref 65–99)
Glucose-Capillary: 92 mg/dL (ref 65–99)
Glucose-Capillary: 93 mg/dL (ref 65–99)

## 2015-10-07 SURGERY — LEFT HEART CATH AND CORONARY ANGIOGRAPHY
Anesthesia: Moderate Sedation | Laterality: Right

## 2015-10-07 MED ORDER — SODIUM CHLORIDE 0.9 % IJ SOLN: 3.0000 mL | Freq: Two times a day (BID) | INTRAMUSCULAR | Status: DC

## 2015-10-07 MED ORDER — FENTANYL CITRATE (PF) 100 MCG/2ML IJ SOLN
INTRAMUSCULAR | Status: DC | PRN
  Administered 2015-10-07 (×2): 25 ug via INTRAVENOUS
  Administered 2015-10-07: 50 ug via INTRAVENOUS

## 2015-10-07 MED ORDER — SODIUM CHLORIDE 0.9 % IJ SOLN: 3.0000 mL | INTRAMUSCULAR | Status: DC | PRN

## 2015-10-07 MED ORDER — ONDANSETRON HCL 4 MG/2ML IJ SOLN: 4.0000 mg | Freq: Four times a day (QID) | INTRAMUSCULAR | Status: DC | PRN

## 2015-10-07 MED ORDER — CLOPIDOGREL BISULFATE 75 MG PO TABS: 75.0000 mg | ORAL_TABLET | Freq: Every day | ORAL | Status: DC

## 2015-10-07 MED ORDER — SODIUM CHLORIDE 0.9 % WEIGHT BASED INFUSION: 1.0000 mL/kg/h | INTRAVENOUS | Status: AC

## 2015-10-07 MED ORDER — SODIUM CHLORIDE 0.9 % IV SOLN: 250.0000 mL | INTRAVENOUS | Status: DC | PRN

## 2015-10-07 MED ORDER — ACETAMINOPHEN 325 MG PO TABS: 650.0000 mg | ORAL_TABLET | ORAL | Status: DC | PRN

## 2015-10-07 MED ORDER — HEPARIN (PORCINE) IN NACL 2-0.9 UNIT/ML-% IJ SOLN
INTRAMUSCULAR | Status: AC
  Filled 2015-10-07: qty 1000

## 2015-10-07 MED ORDER — FENTANYL CITRATE (PF) 100 MCG/2ML IJ SOLN
INTRAMUSCULAR | Status: AC
  Filled 2015-10-07: qty 2

## 2015-10-07 MED ORDER — ACETAMINOPHEN 325 MG PO TABS: 650.0000 mg | ORAL_TABLET | Freq: Four times a day (QID) | ORAL | Status: DC | PRN

## 2015-10-07 MED ORDER — IOHEXOL 300 MG/ML  SOLN
INTRAMUSCULAR | Status: DC | PRN
  Administered 2015-10-07: 170 mL via INTRA_ARTERIAL

## 2015-10-07 MED ORDER — CLOPIDOGREL BISULFATE 75 MG PO TABS
300.0000 mg | ORAL_TABLET | Freq: Once | ORAL | Status: AC
  Administered 2015-10-07: 300 mg via ORAL
  Filled 2015-10-07: qty 4

## 2015-10-07 MED ORDER — MIDAZOLAM HCL 2 MG/2ML IJ SOLN
INTRAMUSCULAR | Status: AC
  Filled 2015-10-07: qty 2

## 2015-10-07 MED ORDER — MIDAZOLAM HCL 2 MG/2ML IJ SOLN
INTRAMUSCULAR | Status: DC | PRN
  Administered 2015-10-07 (×2): 1 mg via INTRAVENOUS

## 2015-10-07 SURGICAL SUPPLY — 9 items
CATH INFINITI 5 FR LCB (CATHETERS) ×3 IMPLANT
CATH INFINITI 5FR ANG PIGTAIL (CATHETERS) ×3 IMPLANT
CATH INFINITI 5FR JL4 (CATHETERS) ×3 IMPLANT
CATH INFINITI JR4 5F (CATHETERS) ×3 IMPLANT
KIT MANI 3VAL PERCEP (MISCELLANEOUS) ×3 IMPLANT
NEEDLE PERC 18GX7CM (NEEDLE) ×3 IMPLANT
PACK CARDIAC CATH (CUSTOM PROCEDURE TRAY) ×3 IMPLANT
SHEATH PINNACLE 5F 10CM (SHEATH) ×3 IMPLANT
WIRE EMERALD 3MM-J .035X150CM (WIRE) ×3 IMPLANT

## 2015-10-07 NOTE — Plan of Care (Signed)
Problem: Phase II Progression Outcomes Goal: Ambulates up to 600 ft. in hall x 1 Outcome: Completed/Met Date Met:  10/07/15 Ambulated 3 times around nurses station with no complaints of SOB, dizziness or pain.

## 2015-10-07 NOTE — Progress Notes (Signed)
Pt returned from cath lab. R femoral site WNL, no hematoma or bleeding. Positive pulses verified by doppler. Pt A&O, complain of back pain and pain at cath site, will give pain medication when able to. Pt educated on ambulation post cath and is aware he can sit up at 1100.

## 2015-10-07 NOTE — Progress Notes (Signed)
Patient ambulated in hall, around RN station x3, approx. 600+ ft. Tolerated well, femoral site WNL, no complaints of pain, dizziness or shortness of breath.

## 2015-10-07 NOTE — Discharge Instructions (Signed)
Activity as tolerated Diet healthy heart, diabetic Follow-up with primary care physician in a week Follow-up with cardiology Dr. Humphrey Rolls on Friday, November 25th at 11 AM

## 2015-10-07 NOTE — OR Nursing (Signed)
etCo2 d/c at 922AM, stable

## 2015-10-07 NOTE — OR Nursing (Signed)
CBG: 93 upon arrival

## 2015-10-07 NOTE — Discharge Summary (Signed)
[facility] NAME: Lawrence [name]    MR#:  [id number]  DATE OF BIRTH:  1956/04/03  DATE OF ADMISSION:  10/05/2015 ADMITTING PHYSICIAN: Idelle Crouch, MD  DATE OF DISCHARGE: 10/07/2015 PRIMARY CARE PHYSICIAN: [name], MD    ADMISSION DIAGNOSIS:  Atrial fibrillation with RVR ([employer]) [I48.91] Chest pain, unspecified chest pain type [R07.9]  DISCHARGE DIAGNOSIS:  Principal Problem:   Unstable angina (HCC) Active Problems:   PAF (paroxysmal atrial fibrillation) (HCC)   Acute coronary syndrome ([place])   SECONDARY DIAGNOSIS:   Past Medical History  Diagnosis Date  . CAD (coronary artery disease)     a. 2002 CABGx2 (LIMA->LAD, VG->VG->OM1);  b. 09/2012 DES->OM;  c. 03/2015 PTCA of LAD [facility]) in setting of atretic LIMA; d. 05/2015 Cath [facility]): nonobs dzs; e. 06/2015 Cath ([facility]): LM nl, LAD 45p/d ISR, 50d, D1/2 small, LCX 50p/d ISR, OM1 70ost, 30 ISR, VG->OM1 50ost, 70m LIMA->LAD 99p/d - atretic, RCA dom, nl; f.cath 10/16: 40-50%(FFR 0.90) pLAD, 75% (FFR 0.77) mLAD s/p PCI/DES, oRCA 40% (FFR0.95)  . Essential hypertension   . COPD (chronic obstructive pulmonary disease) ([place]     a. Chronic bronchitis and emphysema.  . Type II diabetes mellitus ([place]   . Asthma   . Celiac disease   . Chronic diastolic CHF (congestive heart failure) ([place]     a. 06/2009 Echo: EF 60-65%, Gr 1 DD, triv AI, mildly dil LA, nl RV.  .Marland KitchenAnxiety   . History of tobacco abuse     a. Quit 2014.  .Marland KitchenPSVT (paroxysmal supraventricular tachycardia) ([place]     a. 10/2012 Noted on [facility] Patch.  . DAveryCOURSE:   * Unstable angina with Hx of CAD and stent  Appreciated cardiology help, cath  Done 11/21, showing stent in the mid LAD patent and left circumflex and right coronary artery having normal no significant disease. Even though the bypass grafts have disease but the native vessels are fine.   Cont ASA, statin, Coreg, plavix.,  Imdur. F/u with Dr.Khan , cardio 11/25  Chest pain is prob noncardiac, recommended PPI for GERD Pt was chest pain free at discharge  * Htn Lisinopril, coreg, Imdur.  * Diabetes  ISS>  * BPH  Flomax.  * hyperlipidemia  Atorvastatin.  DISCHARGE CONDITIONS:   fair  CONSULTS OBTAINED:  Treatment Team:  SDionisio David MD ANicholes Mango MD   PROCEDURES cardiac cath 11/21  DRUG ALLERGIES:   Allergies  Allergen Reactions  . Albuterol Sulfate [Albuterol] Other (See Comments)    Speeds heart rate up, but Xopenex wasn't effective so MD changed to Albuterol even though it causes palpitations.  . Demerol [Meperidine] Hives  . Sulfa Antibiotics Hives  . Morphine Sulfate Nausea And Vomiting, Rash and Other (See Comments)    Patient states he is allergic only to tablet form.    DISCHARGE MEDICATIONS:   Current Discharge Medication List    START taking these medications   Details  acetaminophen (TYLENOL) 325 MG tablet Take 2 tablets (650 mg total) by mouth every 6 (six) hours as needed for mild pain or headache.      CONTINUE these medications which have CHANGED   Details  clopidogrel (PLAVIX) 75 MG tablet Take 1 tablet (75 mg total) by mouth daily. Qty: 30 tablet, Refills: 5      CONTINUE these medications which have NOT CHANGED   Details  albuterol (PROVENTIL HFA;VENTOLIN HFA) 108 (  90 BASE) MCG/ACT inhaler Inhale 2 puffs into the lungs every 6 (six) hours as needed for wheezing. Qty: 1 Inhaler, Refills: 0   Associated Diagnoses: COPD exacerbation (HCC)    ALPRAZolam (XANAX) 1 MG tablet TAKE 1/2-1 TABLET BY MOUTH 3 TIMES A DAY Qty: 90 tablet, Refills: 3    amLODipine (NORVASC) 5 MG tablet Take 5 mg by mouth daily.     aspirin EC 81 MG tablet Take 81 mg by mouth every morning.    atorvastatin (LIPITOR) 80 MG tablet Take 1 tablet (80 mg total) by mouth at bedtime. Qty: 90 tablet, Refills: 3    carvedilol (COREG) 3.125 MG tablet Take 3.125 mg by mouth 2 (two)  times daily with a meal.     cetirizine (ZYRTEC) 10 MG tablet Take 10 mg by mouth at bedtime.     furosemide (LASIX) 40 MG tablet Take 1 tablet (40 mg total) by mouth 2 (two) times daily. Qty: 60 tablet, Refills: 0    isosorbide mononitrate (IMDUR) 60 MG 24 hr tablet Take 120 mg by mouth daily.     lansoprazole (PREVACID) 30 MG capsule Take 1 capsule (30 mg total) by mouth every morning. Qty: 30 capsule, Refills: 12    lisinopril (PRINIVIL,ZESTRIL) 20 MG tablet Take 20 mg by mouth daily.    metFORMIN (GLUCOPHAGE) 500 MG tablet Take 1 tablet (500 mg total) by mouth every morning. Qty: 90 tablet, Refills: 4    omega-3 acid ethyl esters (LOVAZA) 1 G capsule TAKE FOUR CAPSULES BY MOUTH DAILY Qty: 120 capsule, Refills: 12    oxyCODONE-acetaminophen (PERCOCET) 10-325 MG tablet Take 1 tablet by mouth every 4 (four) hours as needed for pain. Qty: 150 tablet, Refills: 0   Associated Diagnoses: Back pain with left-sided radiculopathy    potassium chloride (K-DUR,KLOR-CON) 10 MEQ tablet Take 1 tablet (10 mEq total) by mouth 2 (two) times daily. Qty: 60 tablet, Refills: 0    predniSONE (DELTASONE) 10 MG tablet Take 6 Tablets for 2 days, 5 Tablets for 2 days, 4 Tablets for 2 days, 3 Tablets for 2 days, 2 Tablets for 2 days, 1 Tablet for 2 days Qty: 42 tablet, Refills: 0    ranolazine (RANEXA) 1000 MG SR tablet Take 1 tablet (1,000 mg total) by mouth 2 (two) times daily. Qty: 60 tablet, Refills: 2    tamsulosin (FLOMAX) 0.4 MG CAPS capsule Take 0.4 mg by mouth daily.    tiotropium (SPIRIVA) 18 MCG inhalation capsule Place 18 mcg into inhaler and inhale daily.    nitroGLYCERIN (NITROSTAT) 0.4 MG SL tablet Place 1 tablet (0.4 mg total) under the tongue every 5 (five) minutes as needed for chest pain. Every 5 minutes as needed for chest pain Qty: 30 tablet, Refills: 3         DISCHARGE INSTRUCTIONS:  Activity as tolerated  Diet healthy heart, diabetic  Follow-up with primary care  physician in a week  Follow-up with cardiology Dr. Humphrey Rolls on Friday, November 25th at 11 AM    DIET:   Diet healthy heart, diabetic   DISCHARGE CONDITION:  fair  ACTIVITY:  Activity as tolerated  OXYGEN:  Home Oxygen: No.   Oxygen Delivery: room air  DISCHARGE LOCATION:  home   If you experience worsening of your admission symptoms, develop shortness of breath, life threatening emergency, suicidal or homicidal thoughts you must seek medical attention immediately by calling 911 or calling your MD immediately  if symptoms less severe.  You Must read complete instructions/literature along with  all the possible adverse reactions/side effects for all the Medicines you take and that have been prescribed to you. Take any new Medicines after you have completely understood and accpet all the possible adverse reactions/side effects.   Please note  You were cared for by a hospitalist during your hospital stay. If you have any questions about your discharge medications or the care you received while you were in the hospital after you are discharged, you can call the unit and asked to speak with the hospitalist on call if the hospitalist that took care of you is not available. Once you are discharged, your primary care physician will handle any further medical issues. Please note that NO REFILLS for any discharge medications will be authorized once you are discharged, as it is imperative that you return to your primary care physician (or establish a relationship with a primary care physician if you do not have one) for your aftercare needs so that they can reassess your need for medications and monitor your lab values.     Today  Chief Complaint  Patient presents with  . Chest Pain    L chest pain since this am   Pt had cardiac cath, chest pain resolved per pt. Tolerated diet following procedure and felt comfortable to go home  ROS:  CONSTITUTIONAL: Denies fevers, chills. Denies any  fatigue, weakness.  EYES: Denies blurry vision, double vision, eye pain. EARS, NOSE, THROAT: Denies tinnitus, ear pain, hearing loss. RESPIRATORY: Denies cough, wheeze, shortness of breath.  CARDIOVASCULAR: Denies chest pain, palpitations, edema.  GASTROINTESTINAL: Denies nausea, vomiting, diarrhea, abdominal pain. Denies bright red blood per rectum. GENITOURINARY: Denies dysuria, hematuria. ENDOCRINE: Denies nocturia or thyroid problems. HEMATOLOGIC AND LYMPHATIC: Denies easy bruising or bleeding. SKIN: Denies rash or lesion. MUSCULOSKELETAL: Denies pain in neck, back, shoulder, knees, hips or arthritic symptoms.  NEUROLOGIC: Denies paralysis, paresthesias.  PSYCHIATRIC: Denies anxiety or depressive symptoms.   VITAL SIGNS:  Blood pressure 103/64, pulse 53, temperature 97.2 F (36.2 C), temperature source Axillary, resp. rate 16, height 5' 7"  (1.702 m), weight 102.331 kg (225 lb 9.6 oz), SpO2 90 %.  I/O:   Intake/Output Summary (Last 24 hours) at 10/07/15 1222 Last data filed at 10/07/15 1128  Gross per 24 hour  Intake 2816.25 ml  Output   3175 ml  Net -358.75 ml    PHYSICAL EXAMINATION:  GENERAL:  59 y.o.-year-old patient lying in the bed with no acute distress.  EYES: Pupils equal, round, reactive to light and accommodation. No scleral icterus. Extraocular muscles intact.  HEENT: Head atraumatic, normocephalic. Oropharynx and nasopharynx clear.  NECK:  Supple, no jugular venous distention. No thyroid enlargement, no tenderness.  LUNGS: Normal breath sounds bilaterally, no wheezing, rales,rhonchi or crepitation. No use of accessory muscles of respiration.  CARDIOVASCULAR: S1, S2 normal. No murmurs, rubs, or gallops.  ABDOMEN: Soft, non-tender, non-distended. Bowel sounds present. No organomegaly or mass.  EXTREMITIES: Groin site with clean dressing , little tender ,  No pedal edema, cyanosis, or clubbing.  NEUROLOGIC: Cranial nerves II through XII are intact. Muscle strength  5/5 in all extremities. Sensation intact. Gait not checked.  PSYCHIATRIC: The patient is alert and oriented x 3.  SKIN: No obvious rash, lesion, or ulcer.   DATA REVIEW:   CBC  Recent Labs Lab 10/06/15 0151  WBC 6.8  HGB 12.1*  HCT 37.0*  PLT 191    Chemistries   Recent Labs Lab 10/05/15 1404 10/06/15 0151  NA 139 137  K 4.8 4.1  CL 105 105  CO2 27 29  GLUCOSE 156* 118*  BUN 24* 20  CREATININE 1.27* 1.06  CALCIUM 9.4 8.6*  MG 1.9  --   AST 22 16  ALT 20 16*  ALKPHOS 54 42  BILITOT 0.8 0.7    Cardiac Enzymes  Recent Labs Lab 10/06/15 0734  TROPONINI <0.03    Microbiology Results  Results for orders placed or performed during the hospital encounter of 07/11/15  MRSA PCR Screening     Status: None   Collection Time: 07/11/15  9:09 PM  Result Value Ref Range Status   MRSA by PCR NEGATIVE NEGATIVE Final    Comment:        The GeneXpert MRSA Assay (FDA approved for NASAL specimens only), is one component of a comprehensive MRSA colonization surveillance program. It is not intended to diagnose MRSA infection nor to guide or monitor treatment for MRSA infections.     RADIOLOGY:  Dg Chest Portable 1 View  10/05/2015  CLINICAL DATA:  Chest pain this morning.  Shortness of breath. EXAM: PORTABLE CHEST 1 VIEW COMPARISON:  09/12/2015 FINDINGS: There is no focal parenchymal opacity. There is no pleural effusion or pneumothorax. The heart and mediastinal contours are unremarkable. There is evidence of prior CABG. The osseous structures are unremarkable. IMPRESSION: No active disease. Electronically Signed   By: Kathreen Devoid   On: 10/05/2015 17:39    EKG:   Orders placed or performed during the hospital encounter of 10/05/15  . ED EKG  . ED EKG  . EKG 12-Lead  . EKG 12-Lead  . EKG 12-Lead  . EKG 12-Lead      Management plans discussed with the patient, family and they are in agreement.  CODE STATUS:     Code Status Orders        Start      Ordered   10/07/15 0936  Full code   Continuous     10/07/15 0936      TOTAL TIME TAKING CARE OF THIS PATIENT: 45  minutes.    @[facility] @  on 10/07/2015 at 12:22 PM  Between 7am to 6pm - Pager - [phone]  After 6pm go to [email or web address] - password [email or web address] [facility]  Office  [phone]  CC: Primary care physician; [name], MD

## 2015-10-07 NOTE — Progress Notes (Signed)
SUBJECTIVE: Patient is still having intermittent chest pain   Filed Vitals:   10/06/15 1956 10/06/15 2340 10/07/15 0435 10/07/15 0730  BP: 103/61  101/57 106/72  Pulse: 56  51 53  Temp: 98 F (36.7 C)  97.8 F (36.6 C) 99.9 F (37.7 C)  TempSrc: Oral  Oral   Resp: 18  18 19   Height:      Weight:  226 lb (102.513 kg) 225 lb 9.6 oz (102.331 kg)   SpO2: 92%  92% 95%    Intake/Output Summary (Last 24 hours) at 10/07/15 0839 Last data filed at 10/07/15 0700  Gross per 24 hour  Intake 3056.25 ml  Output   3200 ml  Net -143.75 ml    LABS: Basic Metabolic Panel:  Recent Labs  10/05/15 1404 10/06/15 0151  NA 139 137  K 4.8 4.1  CL 105 105  CO2 27 29  GLUCOSE 156* 118*  BUN 24* 20  CREATININE 1.27* 1.06  CALCIUM 9.4 8.6*  MG 1.9  --    Liver Function Tests:  Recent Labs  10/05/15 1404 10/06/15 0151  AST 22 16  ALT 20 16*  ALKPHOS 54 42  BILITOT 0.8 0.7  PROT 6.6 5.2*  ALBUMIN 3.5 2.9*   No results for input(s): LIPASE, AMYLASE in the last 72 hours. CBC:  Recent Labs  10/05/15 1404 10/06/15 0151  WBC 7.6 6.8  HGB 14.0 12.1*  HCT 42.4 37.0*  MCV 85.2 85.8  PLT 230 191   Cardiac Enzymes:  Recent Labs  10/05/15 2022 10/06/15 0151 10/06/15 0734  TROPONINI <0.03 <0.03 <0.03   BNP: Invalid input(s): POCBNP D-Dimer: No results for input(s): DDIMER in the last 72 hours. Hemoglobin A1C: No results for input(s): HGBA1C in the last 72 hours. Fasting Lipid Panel: No results for input(s): CHOL, HDL, LDLCALC, TRIG, CHOLHDL, LDLDIRECT in the last 72 hours. Thyroid Function Tests:  Recent Labs  10/05/15 1404  TSH 0.410   Anemia Panel: No results for input(s): VITAMINB12, FOLATE, FERRITIN, TIBC, IRON, RETICCTPCT in the last 72 hours.   PHYSICAL EXAM General: Well developed, well nourished, in no acute distress HEENT:  Normocephalic and atramatic Neck:  No JVD.  Lungs: Clear bilaterally to auscultation and percussion. Heart: HRRR . Normal S1  and S2 without gallops or murmurs.  Abdomen: Bowel sounds are positive, abdomen soft and non-tender  Msk:  Back normal, normal gait. Normal strength and tone for age. Extremities: No clubbing, cyanosis or edema.   Neuro: Alert and oriented X 3. Psych:  Good affect, responds appropriately  TELEMETRY: Sinus rhythm  ASSESSMENT AND PLAN: Atypical chest pain with cardiac catheterization showing stent in the mid LAD patent and left circumflex and right coronary artery having normal no significant disease. Even though the bypass grafts have disease but the native vessels are fine. Advise adding Plavix and discharged with follow-up in the office Friday at 11:00.  Principal Problem:   Unstable angina (HCC) Active Problems:   PAF (paroxysmal atrial fibrillation) ([place])   Acute coronary syndrome ([place])    Neoma Laming A, MD, [facility] 10/07/2015 8:39 AM

## 2015-10-07 NOTE — Progress Notes (Signed)
Pt. Discharged to home via wc. Discharge instructions and medication regimen reviewed at bedside with patient. Pt. verbalizes understanding of instructions and medication regimen. Prescriptions given to patient with discharge papers and some were sent to pharmacy, pt is aware. Patient assessment unchanged from this morning. TELE and IV discontinued per policy.

## 2015-10-15 ENCOUNTER — Inpatient Hospital Stay: Payer: Medicare PPO | Admitting: Family Medicine

## 2015-10-15 IMAGING — CR DG CHEST 2V
1 series · 2 of 2 positions shown · non-contrast
Comparison: Chest radiograph performed 10/11/2013

CLINICAL DATA: Mild chest pain and hypertension. Shortness of
breath.

EXAM:
CHEST  2 VIEW

[Series 1: w chest pa · 0.14mm/px · 2 of 2 slices shown]
[im 1/2]
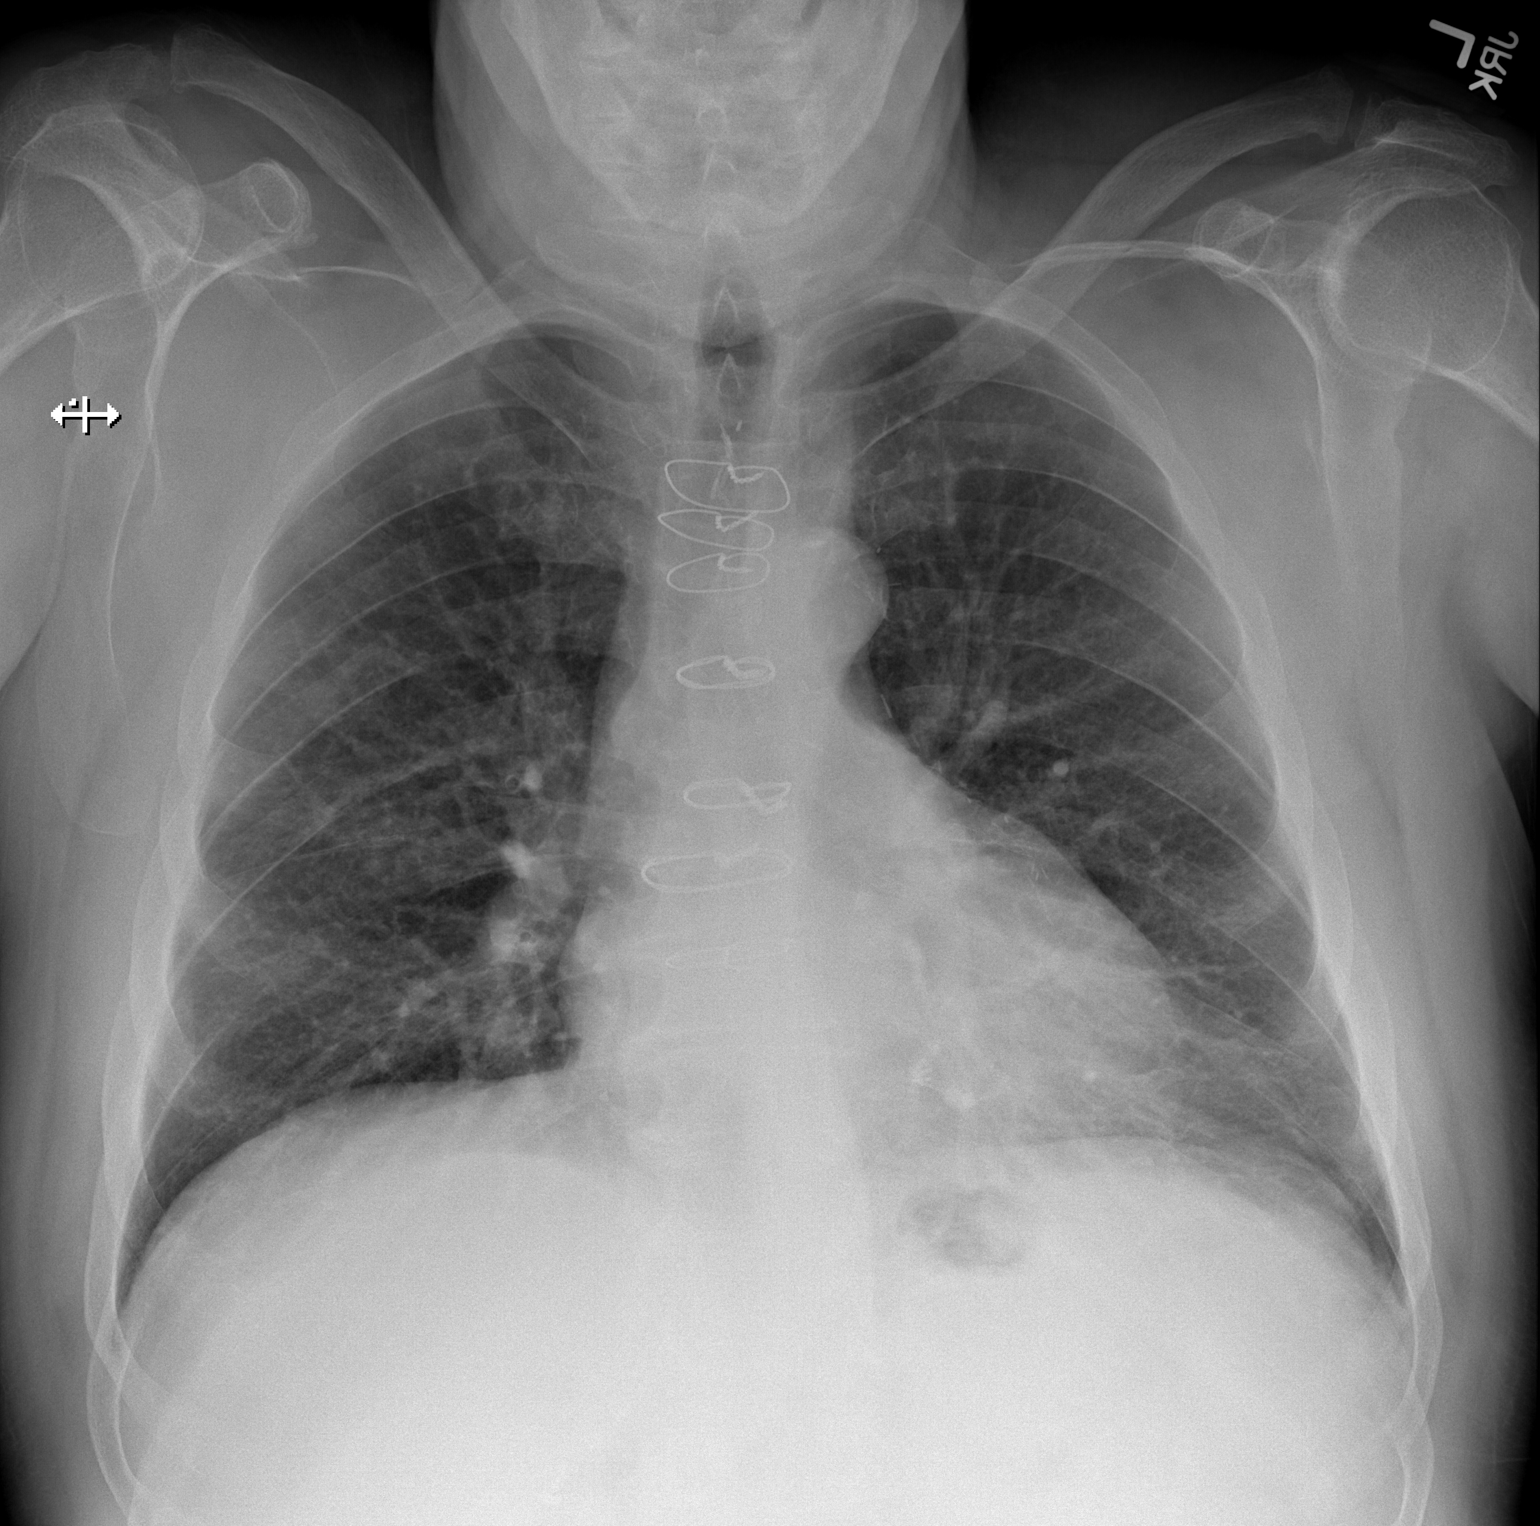
[im 2/2]
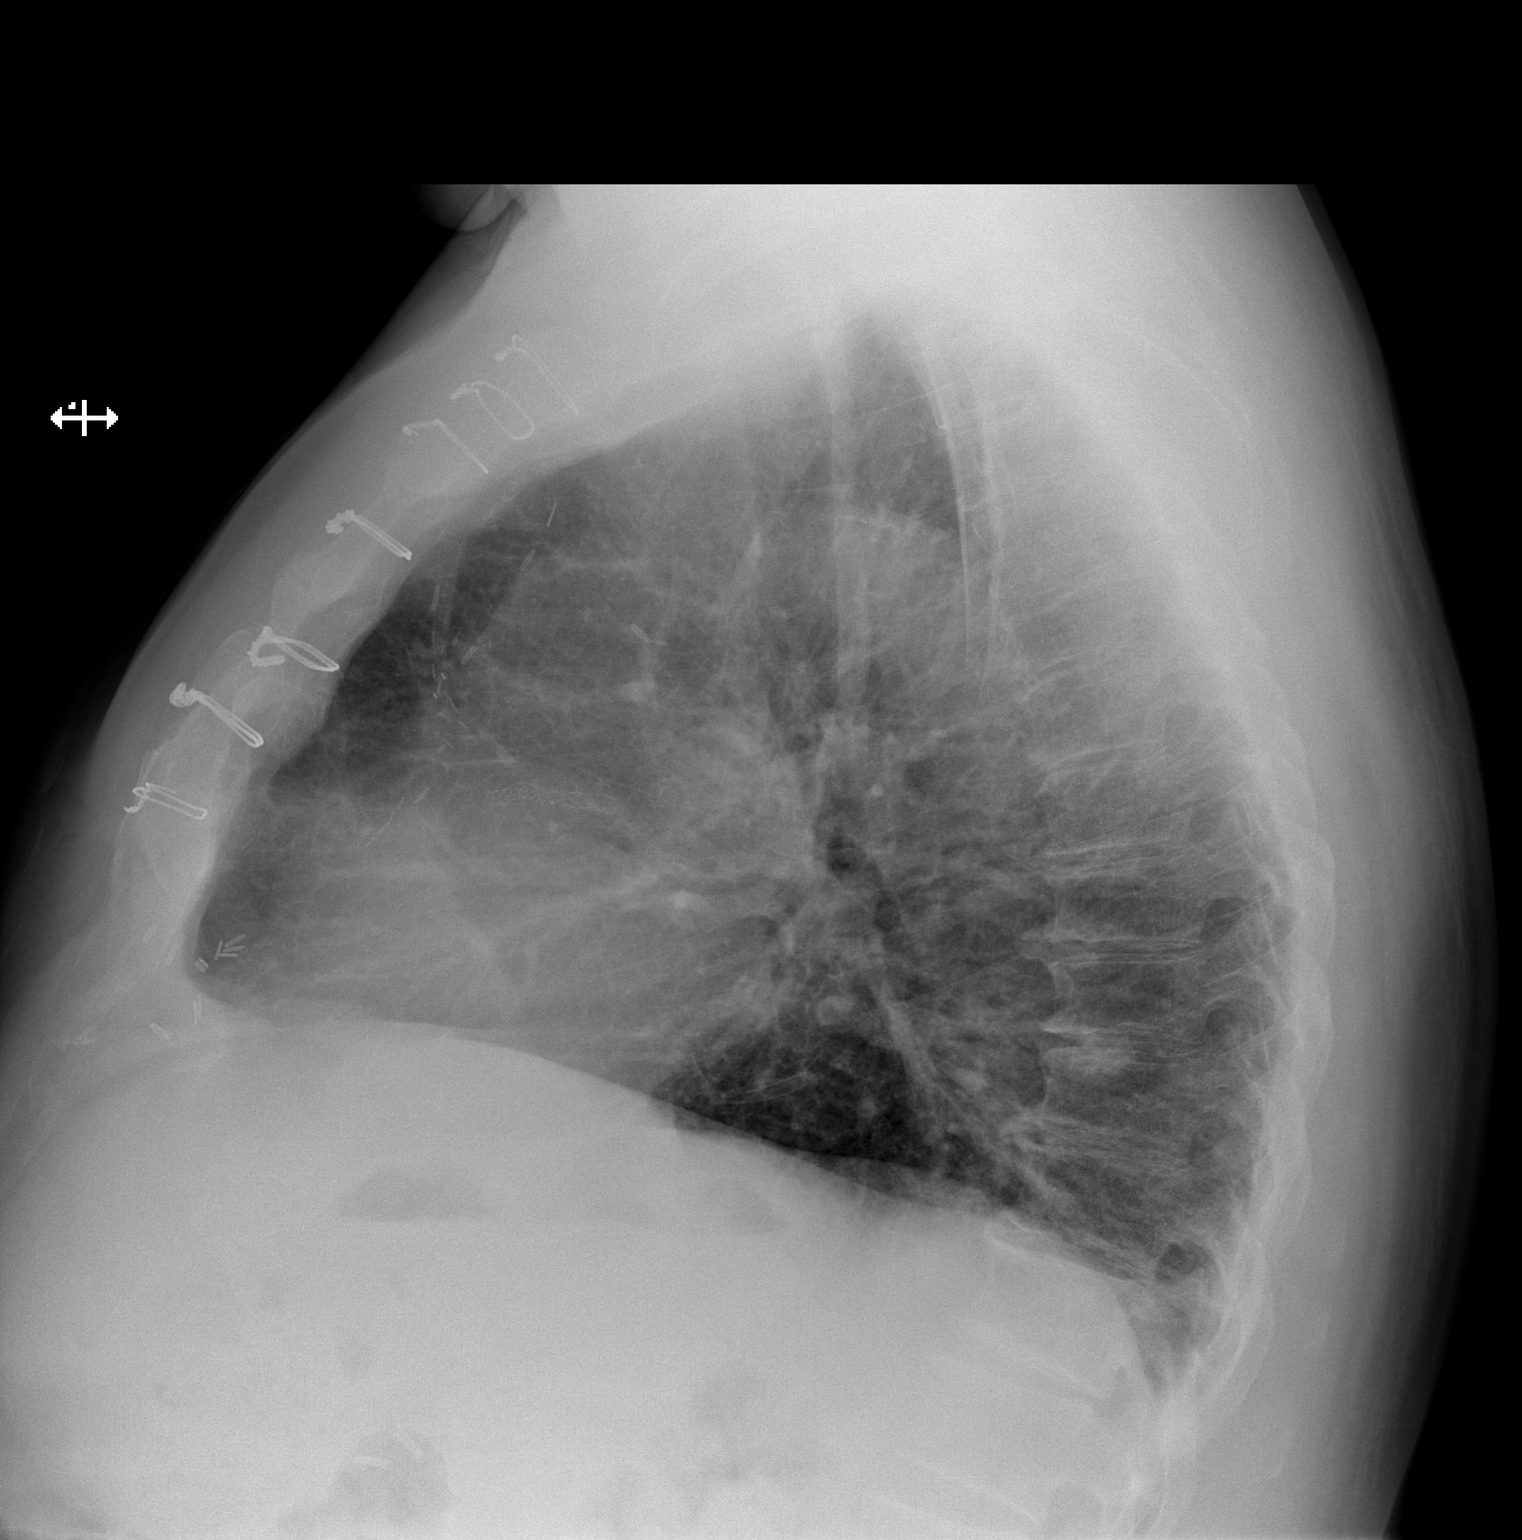

[2 of 2 positions shown; findings below may reference images not displayed]

FINDINGS: The lungs are well-aerated. Mild vascular congestion is noted. Mild
left basilar opacity likely reflects atelectasis. There is no
evidence of pleural effusion or pneumothorax.

The heart is normal in size; the patient is status post median
sternotomy. No acute osseous abnormalities are seen.
IMPRESSION: Mild vascular congestion noted; mild left basilar opacity likely
reflects atelectasis.

## 2015-10-18 IMAGING — US ABDOMEN ULTRASOUND
1 series · 14 of 25 positions shown · non-contrast
Comparison: CT 07/03/2011

CLINICAL DATA: Abdominal pain

EXAM:
ULTRASOUND ABDOMEN COMPLETE

[Series 1: abdomen ultrasound · 0.31mm/px · 14 of 73 slices shown]
[im 1/73]
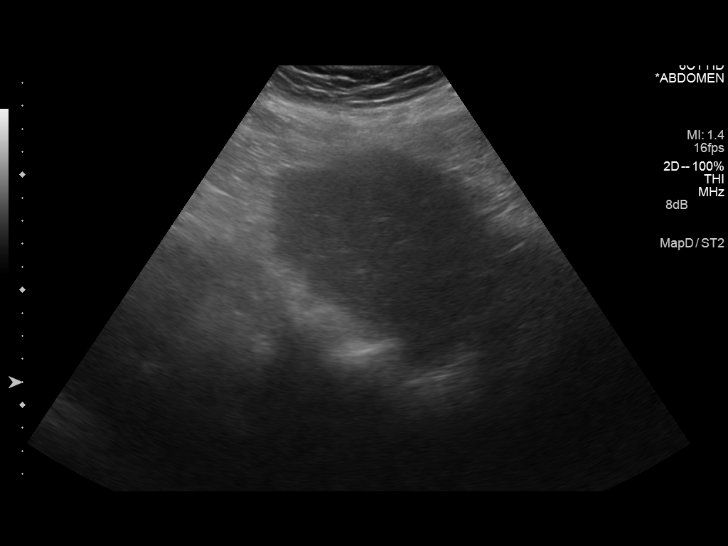
[im 7/73]
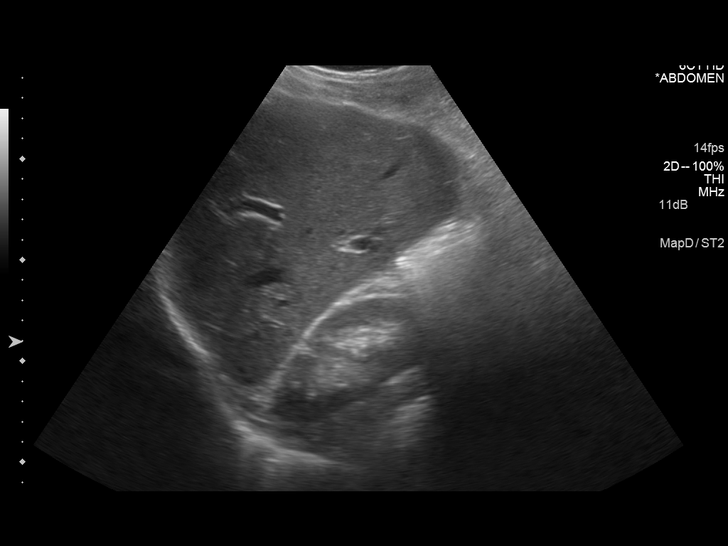
[im 13/73]
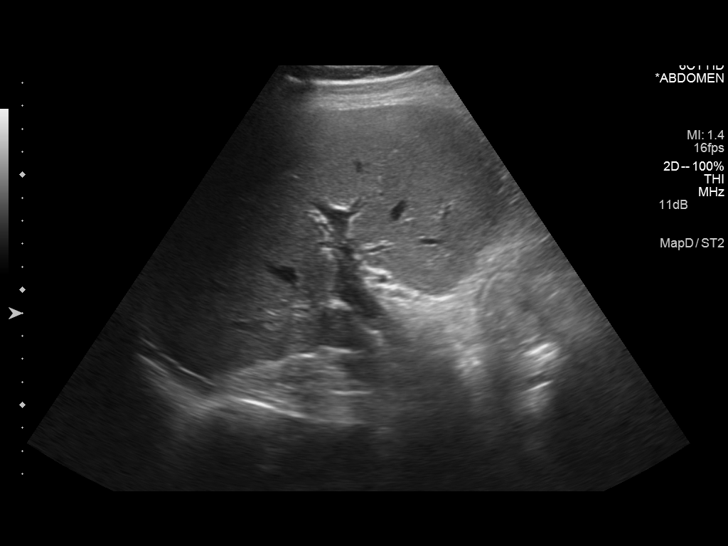
[im 19/73]
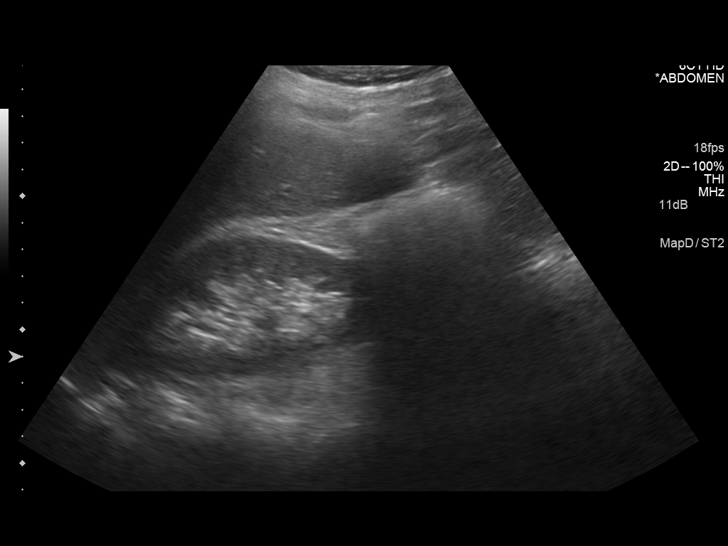
[im 25/73]
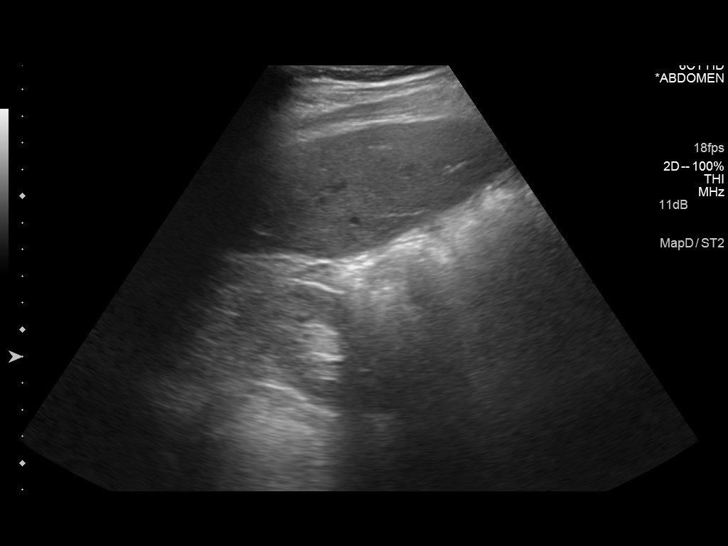
[im 28/73]
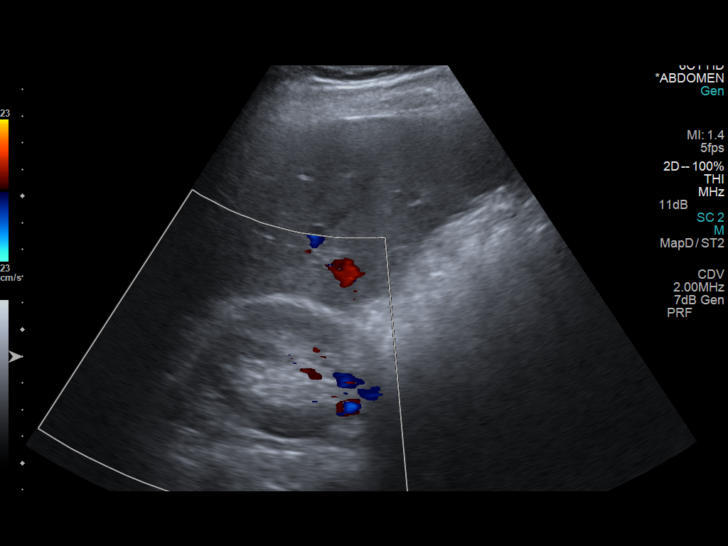
[im 34/73]
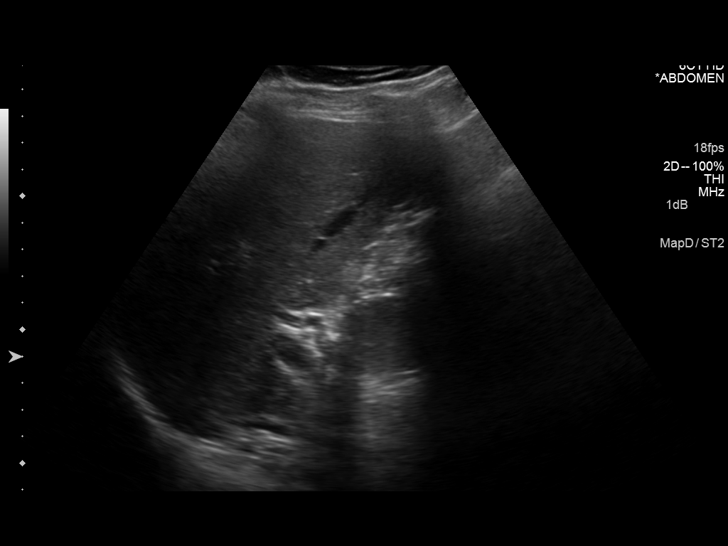
[im 40/73]
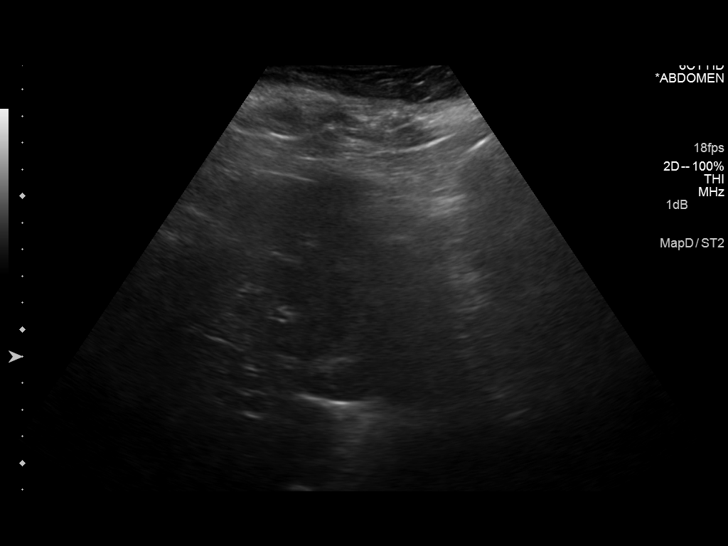
[im 46/73]
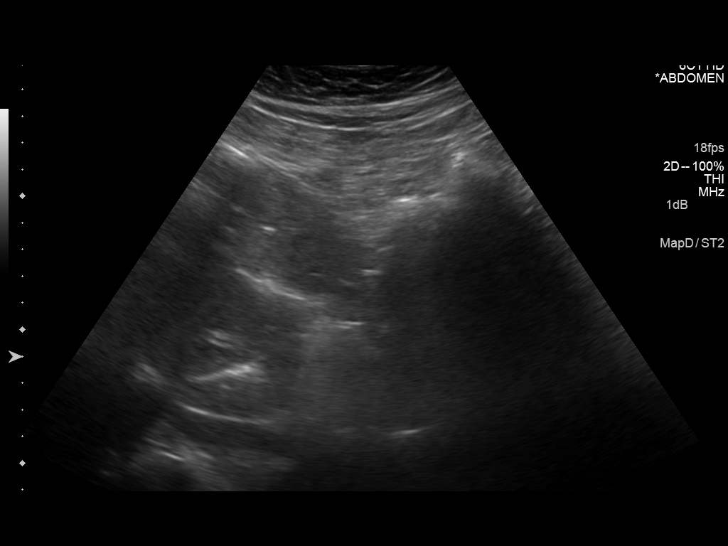
[im 49/73]
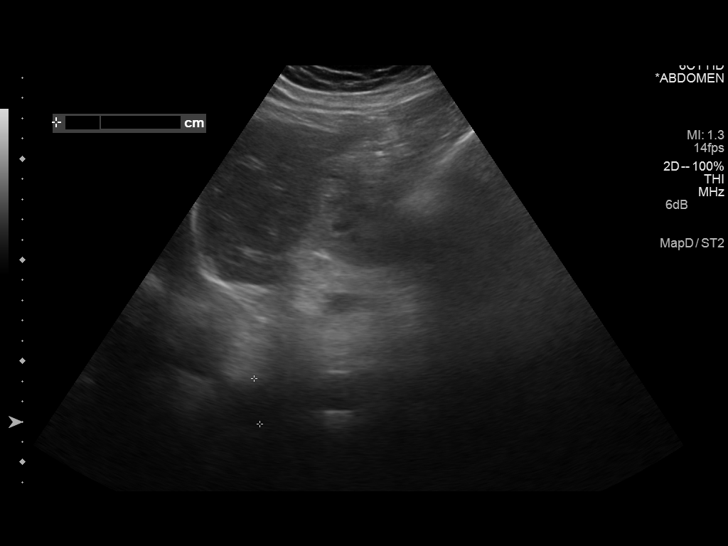
[im 55/73]
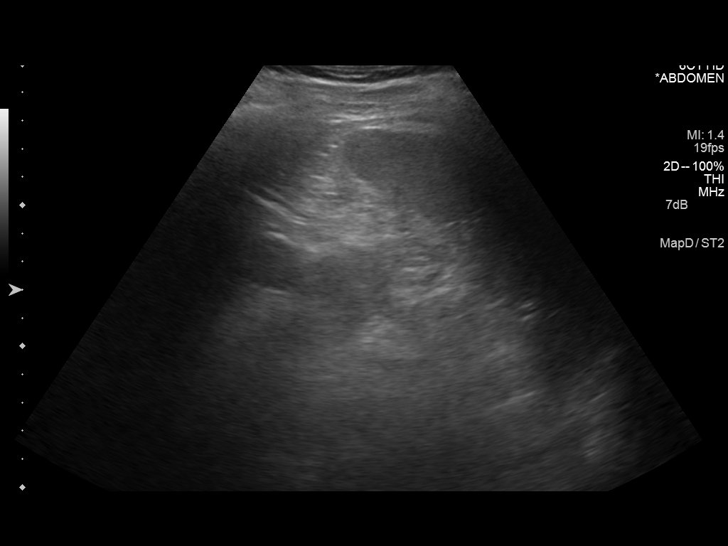
[im 61/73]
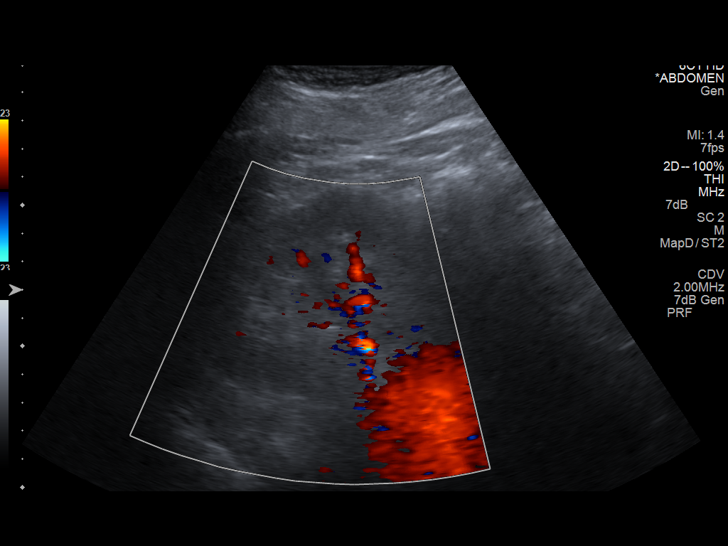
[im 67/73]
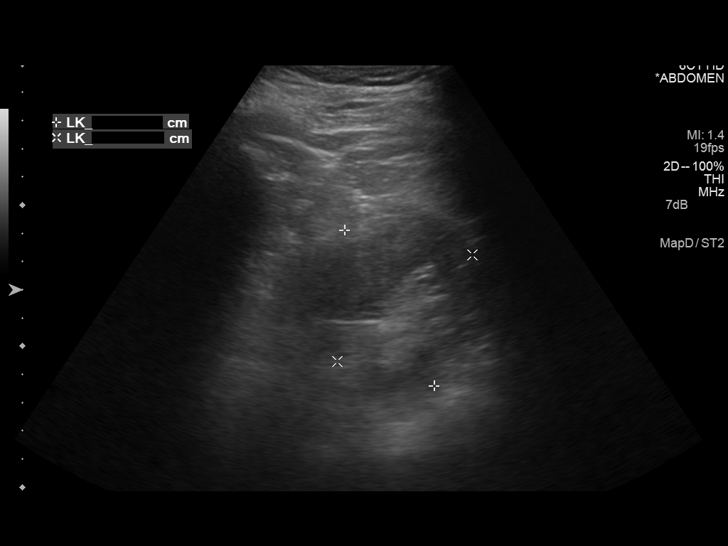
[im 73/73]
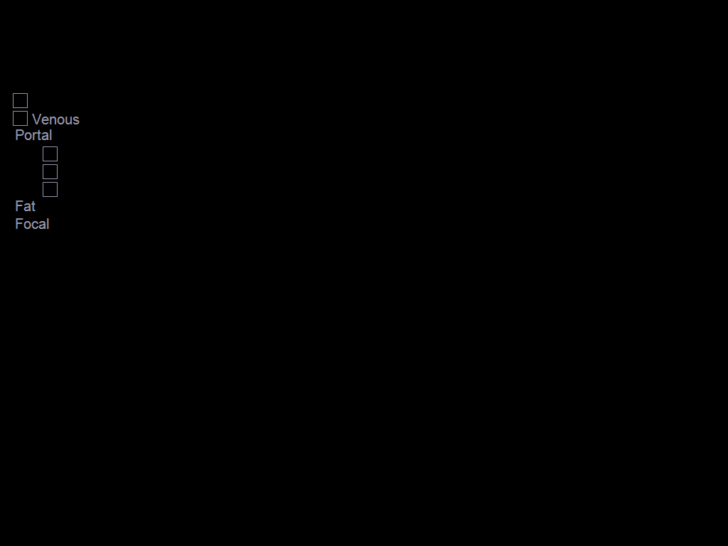

[14 of 25 positions shown; findings below may reference images not displayed]

FINDINGS: Gallbladder:

Surgically absent gallbladder

Common bile duct:

Diameter: 5.4 mm

Liver:

No focal lesion identified. Within normal limits in parenchymal
echogenicity.

IVC:

No abnormality visualized.

Pancreas:

Visualized portion unremarkable.

Spleen:

Size and appearance within normal limits.

Right Kidney:

Length: 10.6 cm. Echogenicity within normal limits. No mass or
hydronephrosis visualized.

Left Kidney:

Length: 12.1 cm. Echogenicity within normal limits. No mass or
hydronephrosis visualized.

Abdominal aorta:

No aneurysm visualized.

Other findings:

None.
IMPRESSION: No acute abnormality.  Post cholecystectomy.

## 2015-10-20 ENCOUNTER — Emergency Department
Admission: EM | Admit: 2015-10-20 | Discharge: 2015-10-21 | Disposition: A | Discharge status: Home / Self Care | Payer: Medicare PPO | Attending: Emergency Medicine | Admitting: Emergency Medicine

## 2015-10-20 ENCOUNTER — Encounter: Payer: Self-pay | Admitting: Emergency Medicine

## 2015-10-20 DIAGNOSIS — R197 Diarrhea, unspecified: Secondary | ICD-10-CM | POA: Diagnosis not present

## 2015-10-20 DIAGNOSIS — R112 Nausea with vomiting, unspecified: Secondary | ICD-10-CM | POA: Diagnosis not present

## 2015-10-20 DIAGNOSIS — R42 Dizziness and giddiness: Secondary | ICD-10-CM | POA: Diagnosis not present

## 2015-10-20 DIAGNOSIS — I10 Essential (primary) hypertension: Secondary | ICD-10-CM | POA: Insufficient documentation

## 2015-10-20 DIAGNOSIS — Z87891 Personal history of nicotine dependence: Secondary | ICD-10-CM | POA: Insufficient documentation

## 2015-10-20 DIAGNOSIS — R1010 Upper abdominal pain, unspecified: Secondary | ICD-10-CM | POA: Insufficient documentation

## 2015-10-20 DIAGNOSIS — R109 Unspecified abdominal pain: Secondary | ICD-10-CM | POA: Insufficient documentation

## 2015-10-20 DIAGNOSIS — Z79899 Other long term (current) drug therapy: Secondary | ICD-10-CM | POA: Insufficient documentation

## 2015-10-20 DIAGNOSIS — R1110 Vomiting, unspecified: Secondary | ICD-10-CM

## 2015-10-20 DIAGNOSIS — J441 Chronic obstructive pulmonary disease with (acute) exacerbation: Secondary | ICD-10-CM | POA: Insufficient documentation

## 2015-10-20 DIAGNOSIS — Z7984 Long term (current) use of oral hypoglycemic drugs: Secondary | ICD-10-CM | POA: Diagnosis not present

## 2015-10-20 DIAGNOSIS — E119 Type 2 diabetes mellitus without complications: Secondary | ICD-10-CM | POA: Diagnosis not present

## 2015-10-20 LAB — URINALYSIS COMPLETE WITH MICROSCOPIC (ARMC ONLY)
Bacteria, UA: NONE SEEN
Bilirubin Urine: NEGATIVE
Glucose, UA: NEGATIVE mg/dL
Hgb urine dipstick: NEGATIVE
Nitrite: NEGATIVE
Protein, ur: NEGATIVE mg/dL
Specific Gravity, Urine: 1.029 (ref 1.005–1.030)
pH: 5 (ref 5.0–8.0)

## 2015-10-20 LAB — CBC
HCT: 45.1 % (ref 40.0–52.0)
Hemoglobin: 14.7 g/dL (ref 13.0–18.0)
MCH: 28.4 pg (ref 26.0–34.0)
MCHC: 32.6 g/dL (ref 32.0–36.0)
MCV: 87.2 fL (ref 80.0–100.0)
Platelets: 206 10*3/uL (ref 150–440)
RBC: 5.17 MIL/uL (ref 4.40–5.90)
RDW: 15.3 % — ABNORMAL HIGH (ref 11.5–14.5)
WBC: 10.6 10*3/uL (ref 3.8–10.6)

## 2015-10-20 LAB — COMPREHENSIVE METABOLIC PANEL
ALT: 22 U/L (ref 17–63)
AST: 19 U/L (ref 15–41)
Albumin: 3.7 g/dL (ref 3.5–5.0)
Alkaline Phosphatase: 62 U/L (ref 38–126)
Anion gap: 6 (ref 5–15)
BUN: 18 mg/dL (ref 6–20)
CO2: 27 mmol/L (ref 22–32)
Calcium: 9.2 mg/dL (ref 8.9–10.3)
Chloride: 107 mmol/L (ref 101–111)
Creatinine, Ser: 1.14 mg/dL (ref 0.61–1.24)
GFR calc Af Amer: >60 mL/min (ref >60)
GFR calc non Af Amer: >60 mL/min (ref >60)
Glucose, Bld: 160 mg/dL — ABNORMAL HIGH (ref 65–99)
Potassium: 4.4 mmol/L (ref 3.5–5.1)
Sodium: 140 mmol/L (ref 135–145)
Total Bilirubin: 0.3 mg/dL (ref 0.3–1.2)
Total Protein: 6.9 g/dL (ref 6.5–8.1)

## 2015-10-20 LAB — LIPASE, BLOOD: Lipase: 26 U/L (ref 11–51)

## 2015-10-20 MED ORDER — SODIUM CHLORIDE 0.9 % IV BOLUS (SEPSIS)
500.0000 mL | Freq: Once | INTRAVENOUS | Status: AC
  Administered 2015-10-20: 500 mL via INTRAVENOUS

## 2015-10-20 MED ORDER — FENTANYL CITRATE (PF) 100 MCG/2ML IJ SOLN
50.0000 ug | Freq: Once | INTRAMUSCULAR | Status: AC
  Administered 2015-10-20: 50 ug via INTRAVENOUS
  Filled 2015-10-20: qty 2

## 2015-10-20 MED ORDER — IOHEXOL 240 MG/ML SOLN
25.0000 mL | Freq: Once | INTRAMUSCULAR | Status: AC | PRN
  Administered 2015-10-20: 25 mL via ORAL

## 2015-10-20 MED ORDER — METOCLOPRAMIDE HCL 5 MG/ML IJ SOLN
10.0000 mg | Freq: Once | INTRAMUSCULAR | Status: AC
  Administered 2015-10-20: 10 mg via INTRAVENOUS
  Filled 2015-10-20: qty 2

## 2015-10-20 NOTE — ED Notes (Signed)
Pt presents to ER with c/o of diarrhea and vomiting since 4 pm today. "my pancreas is bothering me". Pt hurts near left lower abdomen and states sharp, stabbing pain. Pain worsens when he drinks and nothing makes it better. Pt has not been able to tolerate food or liquid.

## 2015-10-20 NOTE — ED Notes (Signed)
Pt uprite on stretcher in exam room with no distress noted; pt reports N/V/D and lower abd pain since 4pm; st pain sharp and stabbing; +BS, abd soft/nondist; tender to left upper quad; pt reports feeling as if his pancreas is bothering him

## 2015-10-21 ENCOUNTER — Emergency Department: Payer: Medicare PPO

## 2015-10-21 MED ORDER — IOHEXOL 300 MG/ML  SOLN
100.0000 mL | Freq: Once | INTRAMUSCULAR | Status: AC | PRN
  Administered 2015-10-21: 100 mL via INTRAVENOUS

## 2015-10-21 MED ORDER — METOCLOPRAMIDE HCL 10 MG PO TABS: 10.0000 mg | ORAL_TABLET | Freq: Four times a day (QID) | ORAL | Status: DC | PRN

## 2015-10-21 NOTE — ED Notes (Signed)
CT tech to bedside with PO contrast....allergies reviewed with patient...instructions for administration of PO contrast reviewed with patient -- patient verbalizes understanding of process. RN to f/u with and encourage patient to consume PO contrast volume. Patient to notify RN when volume completed and for any difficulties experienced while drinking --Patient verbalizes understanding

## 2015-10-21 NOTE — Discharge Instructions (Signed)
Diarrhea °Diarrhea is frequent loose and watery bowel movements. It can cause you to feel weak and dehydrated. Dehydration can cause you to become tired and thirsty, have a dry mouth, and have decreased urination that often is dark yellow. Diarrhea is a sign of another problem, most often an infection that will not last long. In most cases, diarrhea typically lasts 2-3 days. However, it can last longer if it is a sign of something more serious. It is important to treat your diarrhea as directed by your caregiver to lessen or prevent future episodes of diarrhea. °CAUSES  °Some common causes include: °· Gastrointestinal infections caused by viruses, bacteria, or parasites. °· Food poisoning or food allergies. °· Certain medicines, such as antibiotics, chemotherapy, and laxatives. °· Artificial sweeteners and fructose. °· Digestive disorders. °HOME CARE INSTRUCTIONS °· Ensure adequate fluid intake (hydration): Have 1 cup (8 oz) of fluid for each diarrhea episode. Avoid fluids that contain simple sugars or sports drinks, fruit juices, whole milk products, and sodas. Your urine should be clear or pale yellow if you are drinking enough fluids. Hydrate with an oral rehydration solution that you can purchase at pharmacies, retail stores, and online. You can prepare an oral rehydration solution at home by mixing the following ingredients together: °·  - tsp table salt. °· ¾ tsp baking soda. °·  tsp salt substitute containing potassium chloride. °· 1  tablespoons sugar. °· 1 L (34 oz) of water. °· Certain foods and beverages may increase the speed at which food moves through the gastrointestinal (GI) tract. These foods and beverages should be avoided and include: °· Caffeinated and alcoholic beverages. °· High-fiber foods, such as raw fruits and vegetables, nuts, seeds, and whole grain breads and cereals. °· Foods and beverages sweetened with sugar alcohols, such as xylitol, sorbitol, and mannitol. °· Some foods may be well  tolerated and may help thicken stool including: °· Starchy foods, such as rice, toast, pasta, low-sugar cereal, oatmeal, grits, baked potatoes, crackers, and bagels. °· Bananas. °· Applesauce. °· Add probiotic-rich foods to help increase healthy bacteria in the GI tract, such as yogurt and fermented milk products. °· Wash your hands well after each diarrhea episode. °· Only take over-the-counter or prescription medicines as directed by your caregiver. °· Take a warm bath to relieve any burning or pain from frequent diarrhea episodes. °SEEK IMMEDIATE MEDICAL CARE IF:  °· You are unable to keep fluids down. °· You have persistent vomiting. °· You have blood in your stool, or your stools are black and tarry. °· You do not urinate in 6-8 hours, or there is only a small amount of very dark urine. °· You have abdominal pain that increases or localizes. °· You have weakness, dizziness, confusion, or light-headedness. °· You have a severe headache. °· Your diarrhea gets worse or does not get better. °· You have a fever or persistent symptoms for more than 2-3 days. °· You have a fever and your symptoms suddenly get worse. °MAKE SURE YOU:  °· Understand these instructions. °· Will watch your condition. °· Will get help right away if you are not doing well or get worse. °  °This information is not intended to replace advice given to you by your health care provider. Make sure you discuss any questions you have with your health care provider. °  °Document Released: 10/23/2002 Document Revised: 11/23/2014 Document Reviewed: 07/10/2012 °Elsevier Interactive Patient Education ©2016 [employer] ° °Nausea and Vomiting °Nausea is a sick feeling that often comes   before throwing up (vomiting). Vomiting is a reflex where stomach contents come out of your mouth. Vomiting can cause severe loss of body fluids (dehydration). Children and elderly adults can become dehydrated quickly, especially if they also have diarrhea. Nausea and  vomiting are symptoms of a condition or disease. It is important to find the cause of your symptoms. °CAUSES  °· Direct irritation of the stomach lining. This irritation can result from increased acid production (gastroesophageal reflux disease), infection, food poisoning, taking certain medicines (such as nonsteroidal anti-inflammatory drugs), alcohol use, or tobacco use. °· Signals from the brain. These signals could be caused by a headache, heat exposure, an inner ear disturbance, increased pressure in the brain from injury, infection, a tumor, or a concussion, pain, emotional stimulus, or metabolic problems. °· An obstruction in the gastrointestinal tract (bowel obstruction). °· Illnesses such as diabetes, hepatitis, gallbladder problems, appendicitis, kidney problems, cancer, sepsis, atypical symptoms of a heart attack, or eating disorders. °· Medical treatments such as chemotherapy and radiation. °· Receiving medicine that makes you sleep (general anesthetic) during surgery. °DIAGNOSIS °Your caregiver may ask for tests to be done if the problems do not improve after a few days. Tests may also be done if symptoms are severe or if the reason for the nausea and vomiting is not clear. Tests may include: °· Urine tests. °· Blood tests. °· Stool tests. °· Cultures (to look for evidence of infection). °· X-rays or other imaging studies. °Test results can help your caregiver make decisions about treatment or the need for additional tests. °TREATMENT °You need to stay well hydrated. Drink frequently but in small amounts. You may wish to drink water, sports drinks, clear broth, or eat frozen ice pops or gelatin dessert to help stay hydrated. When you eat, eating slowly may help prevent nausea. There are also some antinausea medicines that may help prevent nausea. °HOME CARE INSTRUCTIONS  °· Take all medicine as directed by your caregiver. °· If you do not have an appetite, do not force yourself to eat. However, you must  continue to drink fluids. °· If you have an appetite, eat a normal diet unless your caregiver tells you differently. °¨ Eat a variety of complex carbohydrates (rice, wheat, potatoes, bread), lean meats, yogurt, fruits, and vegetables. °¨ Avoid high-fat foods because they are more difficult to digest. °· Drink enough water and fluids to keep your urine clear or pale yellow. °· If you are dehydrated, ask your caregiver for specific rehydration instructions. Signs of dehydration may include: °¨ Severe thirst. °¨ Dry lips and mouth. °¨ Dizziness. °¨ Dark urine. °¨ Decreasing urine frequency and amount. °¨ Confusion. °¨ Rapid breathing or pulse. °SEEK IMMEDIATE MEDICAL CARE IF:  °· You have blood or brown flecks (like coffee grounds) in your vomit. °· You have black or bloody stools. °· You have a severe headache or stiff neck. °· You are confused. °· You have severe abdominal pain. °· You have chest pain or trouble breathing. °· You do not urinate at least once every 8 hours. °· You develop cold or clammy skin. °· You continue to vomit for longer than 24 to 48 hours. °· You have a fever. °MAKE SURE YOU:  °· Understand these instructions. °· Will watch your condition. °· Will get help right away if you are not doing well or get worse. °  °This information is not intended to replace advice given to you by your health care provider. Make sure you discuss any questions you have with your health care   provider. °  °Document Released: 11/02/2005 Document Revised: 01/25/2012 Document Reviewed: 04/01/2011 °Elsevier Interactive Patient Education ©2016 [employer]. ° °

## 2015-10-21 NOTE — ED Notes (Signed)
Pt to CT via stretcher accomp by CT tech 

## 2015-10-21 NOTE — ED Notes (Signed)
Pt reports relief of nausea and pain now 3/10

## 2015-10-21 NOTE — ED Notes (Signed)
Pt returned to room  

## 2015-10-21 NOTE — ED Notes (Signed)
CT notified pt completed drinking contrast

## 2015-10-21 NOTE — ED Provider Notes (Signed)
[facility] Emergency Department Provider Note  ____________________________________________  Time seen: Approximately 2313 AM  I have reviewed the triage vital signs and the nursing notes.   HISTORY  Chief Complaint Abdominal Pain; Emesis; and Diarrhea    HPI Lawrence [name] is a 59 y.o. male who comes into the hospital today with diarrhea, vomiting, nausea and abdominal pain in his mid abdomen. The patient has a history of pancreatitis and is concerned that this may be the cause of his symptoms. He reports that the pain started around 4:00 and it did not start until after the diarrhea and the vomiting started. The patient reports he's vomited 7 times and has had 10-11 episodes of diarrhea. He reports that his emesis is yellow in color with no blood and the stool is brown liquid very watery with no blood. He reports that his wife was seen in the hospital with bowel obstruction on Friday. The patient denies any recent antibiotics and reports this pain as a 9 out of 10 in intensity. The patient reports that he is always short of breath but he has had some mild dizziness but no urinary symptoms. The patient came in for evaluation again given his history of pancreatitis.   Past Medical History  Diagnosis Date  . CAD (coronary artery disease)     a. 2002 CABGx2 (LIMA->LAD, VG->VG->OM1);  b. 09/2012 DES->OM;  c. 03/2015 PTCA of LAD [facility]) in setting of atretic LIMA; d. 05/2015 Cath [facility]): nonobs dzs; e. 06/2015 Cath ([facility]): LM nl, LAD 45p/d ISR, 50d, D1/2 small, LCX 50p/d ISR, OM1 70ost, 30 ISR, VG->OM1 50ost, 55m LIMA->LAD 99p/d - atretic, RCA dom, nl; f.cath 10/16: 40-50%(FFR 0.90) pLAD, 75% (FFR 0.77) mLAD s/p PCI/DES, oRCA 40% (FFR0.95)  . Essential hypertension   . COPD (chronic obstructive pulmonary disease) ([facility]     a. Chronic bronchitis and emphysema.  . Type II diabetes mellitus ([facility]   . Asthma   . Celiac disease   . Chronic diastolic CHF (congestive heart  failure) ([facility]     a. 06/2009 Echo: EF 60-65%, Gr 1 DD, triv AI, mildly dil LA, nl RV.  .Marland KitchenAnxiety   . History of tobacco abuse     a. Quit 2014.  .Marland KitchenPSVT (paroxysmal supraventricular tachycardia) ([facility]     a. 10/2012 Noted on Zio Patch.  . Dysrhythmia     Patient Active Problem List   Diagnosis Date Noted  . Acute coronary syndrome ([facility] 10/07/2015  . Unstable angina ([facility] 10/05/2015  . PAF (paroxysmal atrial fibrillation) ([facility] 10/05/2015  . Back pain with left-sided radiculopathy 09/30/2015  . Nocturnal hypoxia 09/06/2015  . BPH (benign prostatic hyperplasia) 08/01/2015  . Essential hypertension   . Chronic diastolic CHF (congestive heart failure) ([facility]   . Angina at rest ([facility]   . Pain in the chest   . Chest pain 07/11/2015  . COPD (chronic obstructive pulmonary disease) ([facility] 07/03/2015  . CAD (coronary artery disease) 06/26/2015  . HTN (hypertension) 06/26/2015  . DM (diabetes mellitus) ([facility] 06/26/2015  . Achalasia 07/24/2014  . GERD (gastroesophageal reflux disease) 06/07/2014  . Former tobacco use 04/11/2013  . Chest pain at rest 04/09/2013  . HLD (hyperlipidemia) 04/09/2013    Past Surgical History  Procedure Laterality Date  . Vascular surgery    . Bypass graft    . Tonsillectomy    . Cholecystectomy    . Cardiac catheterization N/A 07/12/2015    rocedure: Left Heart Cath and Cors/Grafts Angiography;  Surgeon: HMallie Mussel  Nicholes Stairs, MD;  Location: [place] [facility];  Service: Cardiovascular;  Laterality: N/A;  . Esophageal dilation    . Cardiac catheterization Right 10/07/2015    Procedure: Left Heart Cath and Cors/Grafts Angiography;  Surgeon: Dionisio David, MD;  Location: [place] [facility];  Service: Cardiovascular;  Laterality: Right;    Current Outpatient Rx  Name  Route  Sig  Dispense  Refill  . acetaminophen (TYLENOL) 325 MG tablet   Oral   Take 2 tablets (650 mg total) by mouth every 6 (six) hours as needed for mild pain or headache.         .  albuterol (PROVENTIL HFA;VENTOLIN HFA) 108 (90 BASE) MCG/ACT inhaler   Inhalation   Inhale 2 puffs into the lungs every 6 (six) hours as needed for wheezing.   1 Inhaler   0   . ALPRAZolam (XANAX) 1 MG tablet   Oral   Take 1 mg by mouth 3 (three) times daily.         Marland Kitchen amLODipine (NORVASC) 5 MG tablet   Oral   Take 5 mg by mouth daily.          Marland Kitchen apixaban (ELIQUIS) 5 MG TABS tablet   Oral   Take 5 mg by mouth 2 (two) times daily.         Marland Kitchen aspirin EC 81 MG tablet   Oral   Take 81 mg by mouth every morning.         Marland Kitchen atorvastatin (LIPITOR) 80 MG tablet   Oral   Take 1 tablet (80 mg total) by mouth at bedtime.   90 tablet   3   . carvedilol (COREG) 3.125 MG tablet   Oral   Take 3.125 mg by mouth 2 (two) times daily with a meal.          . cetirizine (ZYRTEC) 10 MG tablet   Oral   Take 10 mg by mouth at bedtime.          . clopidogrel (PLAVIX) 75 MG tablet   Oral   Take 1 tablet (75 mg total) by mouth daily.   30 tablet   5   . furosemide (LASIX) 40 MG tablet   Oral   Take 1 tablet (40 mg total) by mouth 2 (two) times daily. Patient taking differently: Take 40 mg by mouth daily.    60 tablet   0   . isosorbide mononitrate (IMDUR) 60 MG 24 hr tablet   Oral   Take 120 mg by mouth daily.          . lansoprazole (PREVACID) 30 MG capsule   Oral   Take 1 capsule (30 mg total) by mouth every morning.   30 capsule   12   . lisinopril (PRINIVIL,ZESTRIL) 20 MG tablet   Oral   Take 20 mg by mouth daily.         . metFORMIN (GLUCOPHAGE) 500 MG tablet   Oral   Take 1 tablet (500 mg total) by mouth every morning.   90 tablet   4   . metoprolol tartrate (LOPRESSOR) 25 MG tablet   Oral   Take 50 mg by mouth 2 (two) times daily.         . nitroGLYCERIN (NITROSTAT) 0.4 MG SL tablet   Sublingual   Place 1 tablet (0.4 mg total) under the tongue every 5 (five) minutes as needed for chest pain. Every 5 minutes as needed for chest pain  30 tablet    3   . omega-3 acid ethyl esters (LOVAZA) 1 G capsule   Oral   Take 1 g by mouth 4 (four) times daily.         Marland Kitchen oxyCODONE-acetaminophen (PERCOCET) 10-325 MG tablet   Oral   Take 1 tablet by mouth every 4 (four) hours as needed for pain.   150 tablet   0   . potassium chloride (K-DUR,KLOR-CON) 10 MEQ tablet   Oral   Take 1 tablet (10 mEq total) by mouth 2 (two) times daily. Patient taking differently: Take 10 mEq by mouth daily.    60 tablet   0   . ranolazine (RANEXA) 1000 MG SR tablet   Oral   Take 1 tablet (1,000 mg total) by mouth 2 (two) times daily.   60 tablet   2   . tamsulosin (FLOMAX) 0.4 MG CAPS capsule   Oral   Take 0.4 mg by mouth daily.         Marland Kitchen tiotropium (SPIRIVA) 18 MCG inhalation capsule   Inhalation   Place 18 mcg into inhaler and inhale daily.         Marland Kitchen ALPRAZolam (XANAX) 1 MG tablet      TAKE 1/2-1 TABLET BY MOUTH 3 TIMES A DAY Patient not taking: Reported on 10/20/2015   90 tablet   3   . metoCLOPramide (REGLAN) 10 MG tablet   Oral   Take 1 tablet (10 mg total) by mouth every 6 (six) hours as needed for nausea.   15 tablet   0   . omega-3 acid ethyl esters (LOVAZA) 1 G capsule      TAKE FOUR CAPSULES BY MOUTH DAILY Patient not taking: Reported on 10/20/2015   120 capsule   12   . predniSONE (DELTASONE) 10 MG tablet      Take 6 Tablets for 2 days, 5 Tablets for 2 days, 4 Tablets for 2 days, 3 Tablets for 2 days, 2 Tablets for 2 days, 1 Tablet for 2 days Patient not taking: Reported on 10/20/2015   42 tablet   0     Allergies Albuterol sulfate; Demerol; Prednisone; Sulfa antibiotics; and Morphine sulfate  Family History  Problem Relation Age of Onset  . Heart attack Mother   . Cirrhosis Mother   . Diabetes Mother   . Depression Mother   . Heart disease Mother   . Heart attack Father   . Diabetes Father   . Depression Father   . Heart disease Father   . Parkinson's disease Brother     Social History Social History   Substance Use Topics  . Smoking status: Former Smoker    Quit date: 04/22/2013  . Smokeless tobacco: Never Used  . Alcohol Use: No     Comment: remotely quit alcohol use. Hx of heavy alcohol use.    Review of Systems Constitutional: No fever/chills Eyes: No visual changes. ENT: No sore throat. Cardiovascular: Denies chest pain. Respiratory:  shortness of breath. Gastrointestinal: Abdominal pain, nausea, vomiting Genitourinary: Negative for dysuria. Musculoskeletal: Negative for back pain. Skin: Negative for rash. Neurological: Dizziness  10-point ROS otherwise negative.  ____________________________________________   PHYSICAL EXAM:  VITAL SIGNS: ED Triage Vitals  Enc Vitals Group     BP 10/20/15 2011 130/70 mmHg     Pulse Rate 10/20/15 2011 95     Resp 10/20/15 2011 18     Temp 10/20/15 2011 97.7 F (36.5 C)     Temp Source  10/20/15 2011 Oral     SpO2 10/20/15 2011 96 %     Weight 10/20/15 2011 225 lb (102.059 kg)     Height 10/20/15 2011 5' 7"  (1.702 m)     Head Cir --      Peak Flow --      Pain Score 10/20/15 2012 9     Pain Loc --      Pain Edu? --      Excl. in [place]? --     Constitutional: Alert and oriented. Well appearing and in moderate distress. Eyes: Conjunctivae are normal. PERRL. EOMI. Head: Atraumatic. Nose: No congestion/rhinnorhea. Mouth/Throat: Mucous membranes are moist.  Oropharynx non-erythematous. Cardiovascular: Normal rate, regular rhythm. Grossly normal heart sounds.  Good peripheral circulation. Respiratory: Normal respiratory effort.  No retractions. Lungs CTAB. Gastrointestinal: Soft with mid and upper abdominal tenderness to palpation. No distention. Positive bowel sounds Musculoskeletal: No lower extremity tenderness nor edema.   Neurologic:  Normal speech and language. Skin:  Skin is warm, dry and intact.  Psychiatric: Mood and affect are normal.   ____________________________________________   LABS (all labs ordered are  listed, but only abnormal results are displayed)  Labs Reviewed  COMPREHENSIVE METABOLIC PANEL - Abnormal; Notable for the following:    Glucose, Bld 160 (*)    All other components within normal limits  CBC - Abnormal; Notable for the following:    RDW 15.3 (*)    All other components within normal limits  URINALYSIS COMPLETEWITH MICROSCOPIC ([facility] ONLY) - Abnormal; Notable for the following:    Color, Urine YELLOW (*)    APPearance CLEAR (*)    Ketones, ur TRACE (*)    Leukocytes, UA TRACE (*)    Squamous Epithelial / LPF 0-5 (*)    All other components within normal limits  LIPASE, BLOOD   ____________________________________________  EKG  None ____________________________________________  RADIOLOGY  CT abdomen and pelvis with contrast: No acute abnormality seen within the abdomen or pelvis, mild scattered calcification along the abdominal aorta and its branches. Small left inguinal hernia again noted containing only fat. ____________________________________________   PROCEDURES  Procedure(s) performed: None  Critical Care performed: No  ____________________________________________   INITIAL IMPRESSION / ASSESSMENT AND PLAN / ED COURSE  Pertinent labs & imaging results that were available during my care of the patient were reviewed by me and considered in my medical decision making (see chart for details).  I gave the patient some fentanyl 50 g, Reglan and 500 ML's of normal saline. The patient's pain was improved after this medication. I informed the patient of the results of his CT scan and discussed the possibility of a gastrointestinal illness. The patient at this time is feeling improved and has been able to drink without any vomiting. I will discharge the patient to home and have him follow-up with his primary care physician. Patient agrees the plan as stated. ____________________________________________   FINAL CLINICAL IMPRESSION(S) / ED DIAGNOSES  Final  diagnoses:  Vomiting and diarrhea  Pain of upper abdomen      [name], MD 10/21/15 [phone]

## 2015-10-23 ENCOUNTER — Other Ambulatory Visit: Payer: Self-pay | Admitting: Family Medicine

## 2015-10-24 ENCOUNTER — Telehealth: Payer: Self-pay | Admitting: Family Medicine

## 2015-10-24 NOTE — Telephone Encounter (Signed)
Dr. [name], this was the patient I told you about that is scheduled at 1:45pm on Monday. Patient was seen at [facility] for abdominal pain, nausea and vomiting and then at [facility] for chest pain on the same day 10/20/2015.

## 2015-10-24 NOTE — Telephone Encounter (Signed)
Pt was discharged from [facility] and a week later from [facility].Pt did not know date of discharge.I have scheduled hospital f/u

## 2015-10-25 ENCOUNTER — Other Ambulatory Visit: Payer: Self-pay | Admitting: Family Medicine

## 2015-10-27 NOTE — Telephone Encounter (Signed)
Please call in alprazolam.

## 2015-10-28 ENCOUNTER — Encounter: Payer: Self-pay | Admitting: Urgent Care

## 2015-10-28 ENCOUNTER — Inpatient Hospital Stay: Payer: Medicare PPO | Admitting: Family Medicine

## 2015-10-28 ENCOUNTER — Encounter: Payer: Self-pay | Admitting: Family Medicine

## 2015-10-28 ENCOUNTER — Emergency Department
Admission: EM | Admit: 2015-10-28 | Discharge: 2015-10-28 | Disposition: A | Discharge status: Home / Self Care | Payer: Medicare PPO | Attending: Emergency Medicine | Admitting: Emergency Medicine

## 2015-10-28 ENCOUNTER — Ambulatory Visit (INDEPENDENT_AMBULATORY_CARE_PROVIDER_SITE_OTHER): Payer: Medicare PPO | Admitting: Family Medicine

## 2015-10-28 VITALS — BP 118/68 | HR 73 | Temp 98.2°F | Resp 16 | Ht 68.0 in | Wt 233.0 lb

## 2015-10-28 DIAGNOSIS — Z87891 Personal history of nicotine dependence: Secondary | ICD-10-CM | POA: Insufficient documentation

## 2015-10-28 DIAGNOSIS — K4090 Unilateral inguinal hernia, without obstruction or gangrene, not specified as recurrent: Secondary | ICD-10-CM

## 2015-10-28 DIAGNOSIS — E119 Type 2 diabetes mellitus without complications: Secondary | ICD-10-CM | POA: Insufficient documentation

## 2015-10-28 DIAGNOSIS — M5410 Radiculopathy, site unspecified: Secondary | ICD-10-CM

## 2015-10-28 DIAGNOSIS — I10 Essential (primary) hypertension: Secondary | ICD-10-CM | POA: Diagnosis not present

## 2015-10-28 DIAGNOSIS — G8929 Other chronic pain: Secondary | ICD-10-CM | POA: Diagnosis not present

## 2015-10-28 DIAGNOSIS — F419 Anxiety disorder, unspecified: Secondary | ICD-10-CM

## 2015-10-28 DIAGNOSIS — R1032 Left lower quadrant pain: Secondary | ICD-10-CM | POA: Diagnosis not present

## 2015-10-28 DIAGNOSIS — R109 Unspecified abdominal pain: Secondary | ICD-10-CM | POA: Insufficient documentation

## 2015-10-28 MED ORDER — OXYCODONE-ACETAMINOPHEN 10-325 MG PO TABS: 1.0000 | ORAL_TABLET | ORAL | Status: DC | PRN

## 2015-10-28 MED ORDER — ALPRAZOLAM 1 MG PO TABS: 0.5000 mg | ORAL_TABLET | Freq: Three times a day (TID) | ORAL | Status: DC | PRN

## 2015-10-28 NOTE — Discharge Instructions (Signed)

## 2015-10-28 NOTE — Progress Notes (Signed)
Patient: Lawrence [name] Male    DOB: [birth year]   59 y.o.   MRN: [id number] Visit Date: 10/28/2015  Today's Provider: [name], MD   Chief Complaint  Patient presents with  . Hospitalization Follow-up   Subjective:    HPI   Follow up ER visit  Patient was seen in ER at [facility] on 10/20/2015.             He was treated for vomiting, diarrhea, and pain in uper abdomen. Treatment for this included CT abdomen pelvis with contrast, labs, and UA. Also given fentanyl 50 g, Reglan and 500 ML's of normal saline. CT scan of abdomen showed small hernia (inguinal) Containing only fat. Nausea vomiting and diarrhea have since resolved.   ----------------------------------------------------------------------    Abdominal pain States he has had intermittent pain on left side of abdomen for the last month. It feels like a stabbing pain. It gets much worse when he eats. It never completely resolved. He has been constipated. He states his cardiologist put him on a laxative that he mixes with water to help him have a BM. He stats this has helped with constipation, but pain does not get any better even after having a BM. He was noted to have a small inguinal hernia containing only fat on abdominal CT that was done at ER on 10/20/2015, and he is concerned this may be cause of his pain.    Sleep apnea He states he had sleep study done on 10-22-2015 ordered through Dr. Laurelyn Sickle office, but has not heard anything back about it.    Atrial fibrillation Patient has been having more frequent palpitations for the past month. Has been having more sob than usual for the last 2 weeks. He states he has appointment with cardiologist in January. He also states he is supposed to be set up with a heart monitor by his cardiologist. He is planning on contacting cardiology tomorrow regarding this.     Allergies  Allergen Reactions  . Albuterol Sulfate [Albuterol] Other (See Comments)    Speeds heart rate up,  but Xopenex wasn't effective so MD changed to Albuterol even though it causes palpitations.  . Demerol [Meperidine] Hives  . Prednisone Hypertension  . Sulfa Antibiotics Hives  . Morphine Sulfate Nausea And Vomiting, Rash and Other (See Comments)    Patient states he is allergic only to tablet form.   Previous Medications   ACETAMINOPHEN (TYLENOL) 325 MG TABLET    Take 2 tablets (650 mg total) by mouth every 6 (six) hours as needed for mild pain or headache.   ALBUTEROL (PROVENTIL HFA;VENTOLIN HFA) 108 (90 BASE) MCG/ACT INHALER    Inhale 2 puffs into the lungs every 6 (six) hours as needed for wheezing.   ALPRAZOLAM (XANAX) 1 MG TABLET    TAKE 1/2-1 TABLET BY MOUTH 3 TIMES A DAY   AMLODIPINE (NORVASC) 5 MG TABLET    Take 5 mg by mouth daily.    APIXABAN (ELIQUIS) 5 MG TABS TABLET    Take 5 mg by mouth 2 (two) times daily.   ASPIRIN EC 81 MG TABLET    Take 81 mg by mouth every morning.   ATORVASTATIN (LIPITOR) 80 MG TABLET    Take 1 tablet (80 mg total) by mouth at bedtime.   CARVEDILOL (COREG) 3.125 MG TABLET    Take 3.125 mg by mouth 2 (two) times daily with a meal.    CETIRIZINE (ZYRTEC) 10 MG TABLET  TAKE ONE TABLET AT BEDTIME   CLOPIDOGREL (PLAVIX) 75 MG TABLET    Take 1 tablet (75 mg total) by mouth daily.   FUROSEMIDE (LASIX) 40 MG TABLET    Take 1 tablet (40 mg total) by mouth 2 (two) times daily.   ISOSORBIDE MONONITRATE (IMDUR) 60 MG 24 HR TABLET    Take 120 mg by mouth daily.    LANSOPRAZOLE (PREVACID) 30 MG CAPSULE    Take 1 capsule (30 mg total) by mouth every morning.   LISINOPRIL (PRINIVIL,ZESTRIL) 20 MG TABLET    Take 20 mg by mouth daily.   METFORMIN (GLUCOPHAGE) 500 MG TABLET    Take 1 tablet (500 mg total) by mouth every morning.   METOCLOPRAMIDE (REGLAN) 10 MG TABLET    Take 1 tablet (10 mg total) by mouth every 6 (six) hours as needed for nausea.   METOPROLOL TARTRATE (LOPRESSOR) 25 MG TABLET    Take 50 mg by mouth 2 (two) times daily.   NITROGLYCERIN (NITROSTAT) 0.4 MG  SL TABLET    Place 1 tablet (0.4 mg total) under the tongue every 5 (five) minutes as needed for chest pain. Every 5 minutes as needed for chest pain   OMEGA-3 ACID ETHYL ESTERS (LOVAZA) 1 G CAPSULE    TAKE FOUR CAPSULES BY MOUTH DAILY   OMEGA-3 ACID ETHYL ESTERS (LOVAZA) 1 G CAPSULE    Take 1 g by mouth 4 (four) times daily.   OXYCODONE-ACETAMINOPHEN (PERCOCET) 10-325 MG TABLET    Take 1 tablet by mouth every 4 (four) hours as needed for pain.   POTASSIUM CHLORIDE (K-DUR) 10 MEQ TABLET    TAKE 1 TABLET BY MOUTH TWICE A DAY.   POTASSIUM CHLORIDE (K-DUR,KLOR-CON) 10 MEQ TABLET    Take 1 tablet (10 mEq total) by mouth 2 (two) times daily.   PREDNISONE (DELTASONE) 10 MG TABLET    Take 6 Tablets for 2 days, 5 Tablets for 2 days, 4 Tablets for 2 days, 3 Tablets for 2 days, 2 Tablets for 2 days, 1 Tablet for 2 days   RANOLAZINE (RANEXA) 1000 MG SR TABLET    Take 1 tablet (1,000 mg total) by mouth 2 (two) times daily.   TAMSULOSIN (FLOMAX) 0.4 MG CAPS CAPSULE    Take 0.4 mg by mouth daily.   TIOTROPIUM (SPIRIVA) 18 MCG INHALATION CAPSULE    Place 18 mcg into inhaler and inhale daily.    Review of Systems  Constitutional: Negative for fever, chills and appetite change.  Respiratory: Positive for chest tightness, shortness of breath and wheezing.   Cardiovascular: Positive for chest pain and palpitations.  Gastrointestinal: Positive for abdominal pain. Negative for nausea, vomiting and diarrhea.  Musculoskeletal: Positive for back pain.  Psychiatric/Behavioral: The patient is not nervous/anxious.       Admission on 10/20/2015, Discharged on 10/21/2015  Component Date Value Ref Range Status  . Lipase 10/20/2015 26  11 - 51 U/L Final  . Sodium 10/20/2015 140  135 - 145 mmol/L Final  . Potassium 10/20/2015 4.4  3.5 - 5.1 mmol/L Final  . Chloride 10/20/2015 107  101 - 111 mmol/L Final  . CO2 10/20/2015 27  22 - 32 mmol/L Final  . Glucose, Bld 10/20/2015 160* 65 - 99 mg/dL Final  . BUN 10/20/2015 18   6 - 20 mg/dL Final  . Creatinine, Ser 10/20/2015 1.14  0.61 - 1.24 mg/dL Final  . Calcium 10/20/2015 9.2  8.9 - 10.3 mg/dL Final  . Total Protein 10/20/2015 6.9  6.5 -  8.1 g/dL Final  . Albumin 10/20/2015 3.7  3.5 - 5.0 g/dL Final  . AST 10/20/2015 19  15 - 41 U/L Final  . ALT 10/20/2015 22  17 - 63 U/L Final  . Alkaline Phosphatase 10/20/2015 62  38 - 126 U/L Final  . Total Bilirubin 10/20/2015 0.3  0.3 - 1.2 mg/dL Final  . GFR calc non Af Amer 10/20/2015 >60  >60 mL/min Final  . GFR calc Af Amer 10/20/2015 >60  >60 mL/min Final  . Anion gap 10/20/2015 6  5 - 15 Final  . WBC 10/20/2015 10.6  3.8 - 10.6 K/uL Final  . RBC 10/20/2015 5.17  4.40 - 5.90 MIL/uL Final  . Hemoglobin 10/20/2015 14.7  13.0 - 18.0 g/dL Final  . HCT 10/20/2015 45.1  40.0 - 52.0 % Final  . MCV 10/20/2015 87.2  80.0 - 100.0 fL Final  . MCH 10/20/2015 28.4  26.0 - 34.0 pg Final  . MCHC 10/20/2015 32.6  32.0 - 36.0 g/dL Final  . RDW 10/20/2015 15.3* 11.5 - 14.5 % Final  . Platelets 10/20/2015 206  150 - 440 K/uL Final  . Color, Urine 10/20/2015 YELLOW* YELLOW Final  . APPearance 10/20/2015 CLEAR* CLEAR Final  . Glucose, UA 10/20/2015 NEGATIVE  NEGATIVE mg/dL Final  . Bilirubin Urine 10/20/2015 NEGATIVE  NEGATIVE Final  . Ketones, ur 10/20/2015 TRACE* NEGATIVE mg/dL Final  . Specific Gravity, Urine 10/20/2015 1.029  1.005 - 1.030 Final  . Hgb urine dipstick 10/20/2015 NEGATIVE  NEGATIVE Final  . pH 10/20/2015 5.0  5.0 - 8.0 Final  . Protein, ur 10/20/2015 NEGATIVE  NEGATIVE mg/dL Final  . Nitrite 10/20/2015 NEGATIVE  NEGATIVE Final  . Leukocytes, UA 10/20/2015 TRACE* NEGATIVE Final  . RBC / HPF 10/20/2015 0-5  0 - 5 RBC/hpf Final  . WBC, UA 10/20/2015 0-5  0 - 5 WBC/hpf Final  . Bacteria, UA 10/20/2015 NONE SEEN  NONE SEEN Final  . Squamous Epithelial / LPF 10/20/2015 0-5* NONE SEEN Final  . Mucous 10/20/2015 PRESENT   Final                             Objective:   BP 118/68 mmHg  Pulse 73  Temp(Src)  98.2 F (36.8 C) (Oral)  Resp 16  Ht 5' 8"  (1.727 m)  Wt 233 lb (105.688 kg)  BMI 35.44 kg/m2  SpO2 94%  Physical Exam   General Appearance:    Alert, cooperative, no distress  Eyes:    PERRL, conjunctiva/corneas clear, EOM's intact       Lungs:     Clear to auscultation bilaterally, respirations unlabored  Heart:    Regular rate and rhythm  Neurologic:   Awake, alert, oriented x 3. No apparent focal neurological           defect.   Abd:   Non-tender, non distended. Normal bowel sound.        Assessment & Plan:     1. Back pain with left-sided radiculopathy Adequate pain control on current medication regiment.  - oxyCODONE-acetaminophen (PERCOCET) 10-325 MG tablet; Take 1 tablet by mouth every 4 (four) hours as needed for pain.  Dispense: 150 tablet; Refill: 0   2. Abdominal pain, left lower quadrant Not likely related Inguinal hernia seen on CT. He did have diverticulosis and a single transverse colon polyp on colonoscopy in April by Dr. Rayann Heman. He requests further evaluation.  - Ambulatory referral to Gastroenterology  3.  Left inguinal hernia Likely incidental finding. He is interested in surgical evaluation if no other explanation for pain is found.   4. Acute anxiety Needs refill for alprazolam which he states is working well.  - ALPRAZolam (XANAX) 1 MG tablet; Take 0.5-1 tablets (0.5-1 mg total) by mouth 3 (three) times daily as needed for anxiety.  Dispense: 90 tablet; Refill: 3       [name], MD  [facility]

## 2015-10-28 NOTE — ED Notes (Signed)
Patient presents with reports of issues with his hernia. Patient was here on 12/4 for the same - had CT that showed a hernia.

## 2015-10-28 NOTE — ED Notes (Signed)
Pt in via triage; reports he was here 12/4 w/ nausea/vomitting and concerns for pancreatitis; CT scan showed no pancreatitis.  Pt still having abdominal pain; notified primary care MD, primary care MD states that recent CT showed abdominal hernia.  Primary care MD in process of getting patient an appointment w/ a GI specialist.  Pt states he could not take pain any longer and that is why he is here.  Pt A/Ox4, vitals WDL, no immediate distress at this time.  MD at bedside.

## 2015-10-28 NOTE — ED Notes (Signed)
Pt left prior to receiving discharge paper work and instructions.  MD aware.

## 2015-10-28 NOTE — ED Provider Notes (Signed)
[facility] Emergency Department Provider Note     Time seen: ----------------------------------------- 9:48 PM on 10/28/2015 -----------------------------------------    I have reviewed the triage vital signs and the nursing notes.   HISTORY  Chief Complaint Abdominal Pain    HPI Lawrence [name] is a 59 y.o. male who presents ER for left-sided severe abdominal pain. Nothing makes his symptoms better or worse, was seen by his primary care doctor today without a specific diagnosis for the pain. Patient was seen in the ER recently, had a CT scan that he states told him he had a inguinal hernia. Patient presents stating he has hernia pain.   Past Medical History  Diagnosis Date  . CAD (coronary artery disease)     a. 2002 CABGx2 (LIMA->[name], VG->VG->OM1);  b. 09/2012 DES->OM;  c. 03/2015 [name]) in setting of atretic LIMA; d. 05/2015 Cath [facility]): nonobs dzs; e. 06/2015 Cath ([facility]): LM nl, [name] 45p/d ISR, 50d, D1/2 small, LCX 50p/d ISR, OM1 70ost, 30 ISR, VG->OM1 50ost, 61m LIMA->[name] 99p/d - atretic, RCA dom, nl; f.cath 10/16: 40-50%(FFR 0.90) pLAD, 75% (FFR 0.77) mLAD s/p PCI/DES, oRCA 40% (FFR0.95)  . Essential hypertension   . COPD (chronic obstructive pulmonary disease) ([place]     a. Chronic bronchitis and emphysema.  . Type II diabetes mellitus ([place]   . Asthma   . Celiac disease   . Chronic diastolic CHF (congestive heart failure) ([place]     a. 06/2009 Echo: EF 60-65%, Gr 1 DD, triv AI, mildly dil LA, nl RV.  .Marland KitchenAnxiety   . History of tobacco abuse     a. Quit 2014.  .Marland KitchenPSVT (paroxysmal supraventricular tachycardia) ([place]     a. 10/2012 Noted on Zio Patch.  . Dysrhythmia     Patient Active Problem List   Diagnosis Date Noted  . Acute coronary syndrome ([place] 10/07/2015  . Unstable angina ([place] 10/05/2015  . PAF (paroxysmal atrial fibrillation) ([place] 10/05/2015  . Back pain with left-sided radiculopathy 09/30/2015  . Nocturnal hypoxia  09/06/2015  . BPH (benign prostatic hyperplasia) 08/01/2015  . Essential hypertension   . Chronic diastolic CHF (congestive heart failure) ([place]   . Angina at rest ([facility]   . Pain in the chest   . Chest pain 07/11/2015  . COPD (chronic obstructive pulmonary disease) ([place] 07/03/2015  . CAD (coronary artery disease) 06/26/2015  . HTN (hypertension) 06/26/2015  . DM (diabetes mellitus) ([place] 06/26/2015  . Achalasia 07/24/2014  . GERD (gastroesophageal reflux disease) 06/07/2014  . Former tobacco use 04/11/2013  . Chest pain at rest 04/09/2013  . HLD (hyperlipidemia) 04/09/2013    Past Surgical History  Procedure Laterality Date  . Vascular surgery    . Bypass graft    . Tonsillectomy    . Cholecystectomy    . Cardiac catheterization N/A 07/12/2015    rocedure: Left Heart Cath and Cors/Grafts Angiography;  Surgeon: HBelva Crome MD;  [place] [place];  Service: Cardiovascular;  Laterality: N/A;  . Esophageal dilation    . Cardiac catheterization Right 10/07/2015    Procedure: Left Heart Cath and Cors/Grafts Angiography;  Surgeon: SDionisio David MD;  [facility];  Service: Cardiovascular;  Laterality: Right;    Allergies Albuterol sulfate; Demerol; Prednisone; Sulfa antibiotics; and Morphine sulfate  Social History Social History  Substance Use Topics  . Smoking status: Former Smoker    Quit date: 04/22/2013  . Smokeless tobacco: Never Used  . Alcohol Use:  No     Comment: remotely quit alcohol use. Hx of heavy alcohol use.    Review of Systems Constitutional: Negative for fever. Eyes: Negative for visual changes. ENT: Negative for sore throat. Cardiovascular: Negative for chest pain. Respiratory: Negative for shortness of breath. Gastrointestinal: Positive for abdominal pain Genitourinary: Negative for dysuria. Musculoskeletal: Negative for back pain. Skin: Negative for rash. Neurological: Negative for headaches, focal weakness or  numbness.  10-point ROS otherwise negative.  ____________________________________________   PHYSICAL EXAM:  VITAL SIGNS: ED Triage Vitals  Enc Vitals Group     BP 10/28/15 2116 149/74 mmHg     Pulse Rate 10/28/15 2116 71     Resp 10/28/15 2116 18     Temp 10/28/15 2116 97.6 F (36.4 C)     Temp Source 10/28/15 2116 Oral     SpO2 10/28/15 2116 95 %     Weight 10/28/15 2116 233 lb (105.688 kg)     Height 10/28/15 2116 5' 7"  (1.702 m)     Head Cir --      Peak Flow --      Pain Score 10/28/15 2120 9     Pain Loc --      Pain Edu? --      Excl. in [place]? --     Constitutional: Alert and oriented. Well appearing and in no distress. Eyes: Conjunctivae are normal. PERRL. Normal extraocular movements. ENT   Head: Normocephalic and atraumatic.   Nose: No congestion/rhinnorhea.   Mouth/Throat: Mucous membranes are moist.   Neck: No stridor. Cardiovascular: Normal rate, regular rhythm. Normal and symmetric distal pulses are present in all extremities. No murmurs, rubs, or gallops. Respiratory: Normal respiratory effort without tachypnea nor retractions. Breath sounds are clear and equal bilaterally. No wheezes/rales/rhonchi. Gastrointestinal: Soft and nontender. No distention. No abdominal bruits.  Musculoskeletal: Nontender with normal range of motion in all extremities. No joint effusions.  No lower extremity tenderness nor edema. Neurologic:  Normal speech and language. No gross focal neurologic deficits are appreciated. Speech is normal. No gait instability. Skin:  Skin is warm, dry and intact. No rash noted. Psychiatric: Mood and affect are normal. Speech and behavior are normal. Patient exhibits appropriate insight and judgment. ____________________________________________  ED COURSE:  Pertinent labs & imaging results that were available during my care of the patient were reviewed by me and considered in my medical decision making (see chart for details). Patient is  in no acute distress, recent labs and CT show a small fat filled inguinal hernia. His abdomen is benign, he likely has chronic pain or is constipated.   ____________________________________________  FINAL ASSESSMENT AND PLAN  Chronic abdominal pain  Plan: Mr. Gille is well-known to the ER, I have advised him that his recent CT scan was grossly unremarkable, he is to follow-up with his primary care doctor for these problems.   Earleen Newport, MD   Earleen Newport, MD 10/28/15 [phone]

## 2015-10-28 NOTE — Telephone Encounter (Signed)
Rx called in to pharmacy. 

## 2015-10-29 ENCOUNTER — Telehealth: Payer: Self-pay | Admitting: Family Medicine

## 2015-10-29 NOTE — Telephone Encounter (Signed)
Pt would like to know if you are going to refer him to a specialist for his hernia found on CT from emergency room.Also is the referral to GI correct ? The diagnosis used is back pain

## 2015-11-01 ENCOUNTER — Other Ambulatory Visit: Payer: Self-pay | Admitting: Student

## 2015-11-01 DIAGNOSIS — R112 Nausea with vomiting, unspecified: Secondary | ICD-10-CM

## 2015-11-01 NOTE — Telephone Encounter (Signed)
The hernia on the CT scan was extremely small and is not the cause of his pain. If would like to see a surgeon for a second opinion then ok to refer to general surgery  The GI referral is abdominal pain radiating to his back.

## 2015-11-07 DIAGNOSIS — F418 Other specified anxiety disorders: Secondary | ICD-10-CM | POA: Insufficient documentation

## 2015-11-07 DIAGNOSIS — F419 Anxiety disorder, unspecified: Secondary | ICD-10-CM | POA: Insufficient documentation

## 2015-11-07 DIAGNOSIS — K4090 Unilateral inguinal hernia, without obstruction or gangrene, not specified as recurrent: Secondary | ICD-10-CM | POA: Insufficient documentation

## 2015-11-07 NOTE — Telephone Encounter (Signed)
Pt advised and states he does not want referral to surgeon at this time

## 2015-11-07 NOTE — Telephone Encounter (Signed)
LMTCB

## 2015-11-14 ENCOUNTER — Encounter
Admission: RE | Admit: 2015-11-14 | Discharge: 2015-11-14 | Disposition: A | Discharge status: Home / Self Care | Payer: Medicare PPO | Source: Ambulatory Visit | Attending: Student | Admitting: Student

## 2015-11-14 DIAGNOSIS — R112 Nausea with vomiting, unspecified: Secondary | ICD-10-CM | POA: Diagnosis present

## 2015-11-14 MED ORDER — TECHNETIUM TC 99M SULFUR COLLOID
1.9630 | Freq: Once | INTRAVENOUS | Status: AC | PRN
Start: 2015-11-14 — End: 2015-11-14
  Administered 2015-11-14: 1.963 via ORAL

## 2015-11-19 ENCOUNTER — Other Ambulatory Visit: Payer: Self-pay | Admitting: Family Medicine

## 2015-11-19 MED ORDER — LISINOPRIL 20 MG PO TABS: 10.0000 mg | ORAL_TABLET | Freq: Every day | ORAL | Status: DC

## 2015-11-19 MED ORDER — METOPROLOL TARTRATE 25 MG PO TABS: 25.0000 mg | ORAL_TABLET | Freq: Two times a day (BID) | ORAL | Status: DC

## 2015-11-26 ENCOUNTER — Other Ambulatory Visit: Payer: Self-pay | Admitting: Family Medicine

## 2015-11-26 DIAGNOSIS — M5410 Radiculopathy, site unspecified: Secondary | ICD-10-CM

## 2015-11-26 MED ORDER — OXYCODONE-ACETAMINOPHEN 10-325 MG PO TABS: 1.0000 | ORAL_TABLET | ORAL | Status: DC | PRN

## 2015-11-26 NOTE — Telephone Encounter (Signed)
Pt contacted office for refill request on the following medications:  oxyCODONE-acetaminophen (PERCOCET) 10-325 MG tablet.  DH#[phone]/MS

## 2015-11-27 ENCOUNTER — Emergency Department
Admission: EM | Admit: 2015-11-27 | Discharge: 2015-11-27 | Disposition: A | Discharge status: Home / Self Care | Payer: Medicare PPO | Attending: Student | Admitting: Student

## 2015-11-27 ENCOUNTER — Emergency Department: Payer: Medicare PPO

## 2015-11-27 DIAGNOSIS — Z87891 Personal history of nicotine dependence: Secondary | ICD-10-CM | POA: Insufficient documentation

## 2015-11-27 DIAGNOSIS — M25551 Pain in right hip: Secondary | ICD-10-CM | POA: Insufficient documentation

## 2015-11-27 DIAGNOSIS — Z79899 Other long term (current) drug therapy: Secondary | ICD-10-CM | POA: Diagnosis not present

## 2015-11-27 DIAGNOSIS — E119 Type 2 diabetes mellitus without complications: Secondary | ICD-10-CM | POA: Insufficient documentation

## 2015-11-27 DIAGNOSIS — Z7901 Long term (current) use of anticoagulants: Secondary | ICD-10-CM | POA: Diagnosis not present

## 2015-11-27 DIAGNOSIS — Z791 Long term (current) use of non-steroidal anti-inflammatories (NSAID): Secondary | ICD-10-CM | POA: Diagnosis not present

## 2015-11-27 DIAGNOSIS — Z7984 Long term (current) use of oral hypoglycemic drugs: Secondary | ICD-10-CM | POA: Diagnosis not present

## 2015-11-27 DIAGNOSIS — I10 Essential (primary) hypertension: Secondary | ICD-10-CM | POA: Diagnosis not present

## 2015-11-27 DIAGNOSIS — Z7902 Long term (current) use of antithrombotics/antiplatelets: Secondary | ICD-10-CM | POA: Insufficient documentation

## 2015-11-27 DIAGNOSIS — Z7982 Long term (current) use of aspirin: Secondary | ICD-10-CM | POA: Insufficient documentation

## 2015-11-27 MED ORDER — HYDROCODONE-ACETAMINOPHEN 5-325 MG PO TABS
2.0000 | ORAL_TABLET | Freq: Once | ORAL | Status: AC
  Administered 2015-11-27: 2 via ORAL
  Filled 2015-11-27: qty 2

## 2015-11-27 MED ORDER — NAPROXEN 500 MG PO TABS: 500.0000 mg | ORAL_TABLET | Freq: Two times a day (BID) | ORAL | Status: DC

## 2015-11-27 MED ORDER — CYCLOBENZAPRINE HCL 10 MG PO TABS: 10.0000 mg | ORAL_TABLET | Freq: Three times a day (TID) | ORAL | Status: DC | PRN

## 2015-11-27 MED ORDER — KETOROLAC TROMETHAMINE 30 MG/ML IJ SOLN
60.0000 mg | Freq: Once | INTRAMUSCULAR | Status: AC
  Administered 2015-11-27: 60 mg via INTRAMUSCULAR
  Filled 2015-11-27: qty 2

## 2015-11-27 NOTE — ED Notes (Signed)
Right hip pain with movement. Pt ambulatory.

## 2015-11-27 NOTE — ED Provider Notes (Signed)
[facility] Emergency Department Provider Note  ____________________________________________  Time seen: Approximately 6:51 PM  I have reviewed the triage vital signs and the nursing notes.   HISTORY  Chief Complaint Hip Pain    HPI Lawrence [name] is a 60 y.o. male resents with complaints of one day history of right hip pain. States increased pain with walking and motion. Unable to sit or stand without assistance.Denies any known trauma.   Past Medical History  Diagnosis Date  . CAD (coronary artery disease)     a. 2002 CABGx2 (LIMA->LAD, VG->VG->OM1);  b. 09/2012 DES->OM;  c. 03/2015 PTCA of LAD [facility]) in setting of atretic LIMA; d. 05/2015 Cath [facility]): nonobs dzs; e. 06/2015 Cath ([facility]): LM nl, LAD 45p/d ISR, 50d, D1/2 small, LCX 50p/d ISR, OM1 70ost, 30 ISR, VG->OM1 50ost, 19m LIMA->LAD 99p/d - atretic, RCA dom, nl; f.cath 10/16: 40-50%(FFR 0.90) pLAD, 75% (FFR 0.77) mLAD s/p PCI/DES, oRCA 40% (FFR0.95)  . Essential hypertension   . COPD (chronic obstructive pulmonary disease) ([place]     a. Chronic bronchitis and emphysema.  . Type II diabetes mellitus ([place]   . Asthma   . Celiac disease   . Chronic diastolic CHF (congestive heart failure) ([place]     a. 06/2009 Echo: EF 60-65%, Gr 1 DD, triv AI, mildly dil LA, nl RV.  .Marland KitchenAnxiety   . History of tobacco abuse     a. Quit 2014.  .Marland KitchenPSVT (paroxysmal supraventricular tachycardia) ([place]     a. 10/2012 Noted on Zio Patch.  . Dysrhythmia     Patient Active Problem List   Diagnosis Date Noted  . Left inguinal hernia 11/07/2015  . Acute anxiety 11/07/2015  . Acute coronary syndrome ([place] 10/07/2015  . Unstable angina (HP[place] 10/05/2015  . PAF (paroxysmal atrial fibrillation) ([place] 10/05/2015  . Back pain with left-sided radiculopathy 09/30/2015  . Nocturnal hypoxia 09/06/2015  . BPH (benign prostatic hyperplasia) 08/01/2015  . Essential hypertension   . Chronic diastolic CHF (congestive heart failure) ([place]    . Angina at rest ([facility]   . Pain in the chest   . Chest pain 07/11/2015  . COPD (chronic obstructive pulmonary disease) ([place] 07/03/2015  . CAD (coronary artery disease) 06/26/2015  . HTN (hypertension) 06/26/2015  . DM (diabetes mellitus) ([place] 06/26/2015  . Achalasia 07/24/2014  . GERD (gastroesophageal reflux disease) 06/07/2014  . Former tobacco use 04/11/2013  . Chest pain at rest 04/09/2013  . HLD (hyperlipidemia) 04/09/2013    Past Surgical History  Procedure Laterality Date  . Vascular surgery    . Bypass graft    . Tonsillectomy    . Cholecystectomy    . Cardiac catheterization N/A 07/12/2015    rocedure: Left Heart Cath and Cors/Grafts Angiography;  Surgeon: HBelva Crome MD;  [place] [facility];  Service: Cardiovascular;  Laterality: N/A;  . Esophageal dilation    . Cardiac catheterization Right 10/07/2015    Procedure: Left Heart Cath and Cors/Grafts Angiography;  Surgeon: SDionisio David MD;  [place];  Service: Cardiovascular;  Laterality: Right;    Current Outpatient Rx  Name  Route  Sig  Dispense  Refill  . acetaminophen (TYLENOL) 325 MG tablet   Oral   Take 2 tablets (650 mg total) by mouth every 6 (six) hours as needed for mild pain or headache.         . albuterol (PROVENTIL HFA;VENTOLIN HFA) 108 (90 BASE) MCG/ACT inhaler   Inhalation  Inhale 2 puffs into the lungs every 6 (six) hours as needed for wheezing.   1 Inhaler   0   . ALPRAZolam (XANAX) 1 MG tablet   Oral   Take 0.5-1 tablets (0.5-1 mg total) by mouth 3 (three) times daily as needed for anxiety.   90 tablet   3   . amLODipine (NORVASC) 5 MG tablet   Oral   Take 5 mg by mouth daily.          Marland Kitchen apixaban (ELIQUIS) 5 MG TABS tablet   Oral   Take 5 mg by mouth 2 (two) times daily.         Marland Kitchen aspirin EC 81 MG tablet   Oral   Take 81 mg by mouth every morning.         Marland Kitchen atorvastatin (LIPITOR) 80 MG tablet   Oral   Take 1 tablet (80 mg total) by  mouth at bedtime.   90 tablet   3   . carvedilol (COREG) 3.125 MG tablet   Oral   Take 3.125 mg by mouth 2 (two) times daily with a meal.          . cetirizine (ZYRTEC) 10 MG tablet      TAKE ONE TABLET AT BEDTIME   30 tablet   3   . clopidogrel (PLAVIX) 75 MG tablet   Oral   Take 1 tablet (75 mg total) by mouth daily.   30 tablet   5   . cyclobenzaprine (FLEXERIL) 10 MG tablet   Oral   Take 1 tablet (10 mg total) by mouth every 8 (eight) hours as needed for muscle spasms.   30 tablet   1   . furosemide (LASIX) 40 MG tablet   Oral   Take 1 tablet (40 mg total) by mouth 2 (two) times daily. Patient taking differently: Take 40 mg by mouth daily.    60 tablet   0   . isosorbide mononitrate (IMDUR) 60 MG 24 hr tablet   Oral   Take 120 mg by mouth daily.          . lansoprazole (PREVACID) 30 MG capsule   Oral   Take 1 capsule (30 mg total) by mouth every morning.   30 capsule   12   . lisinopril (PRINIVIL,ZESTRIL) 20 MG tablet   Oral   Take 0.5 tablets (10 mg total) by mouth daily.   1 tablet   1   . metFORMIN (GLUCOPHAGE) 500 MG tablet   Oral   Take 1 tablet (500 mg total) by mouth every morning.   90 tablet   4   . metoCLOPramide (REGLAN) 10 MG tablet   Oral   Take 1 tablet (10 mg total) by mouth every 6 (six) hours as needed for nausea.   15 tablet   0   . metoprolol tartrate (LOPRESSOR) 25 MG tablet   Oral   Take 1 tablet (25 mg total) by mouth 2 (two) times daily.   1 tablet   1   . naproxen (NAPROSYN) 500 MG tablet   Oral   Take 1 tablet (500 mg total) by mouth 2 (two) times daily with a meal.   60 tablet   0   . nitroGLYCERIN (NITROSTAT) 0.4 MG SL tablet   Sublingual   Place 1 tablet (0.4 mg total) under the tongue every 5 (five) minutes as needed for chest pain. Every 5 minutes as needed for chest pain   30 tablet  3   . omega-3 acid ethyl esters (LOVAZA) 1 G capsule      TAKE FOUR CAPSULES BY MOUTH DAILY   120 capsule   12    . omega-3 acid ethyl esters (LOVAZA) 1 G capsule   Oral   Take 1 g by mouth 4 (four) times daily.         Marland Kitchen oxyCODONE-acetaminophen (PERCOCET) 10-325 MG tablet   Oral   Take 1 tablet by mouth every 4 (four) hours as needed for pain.   150 tablet   0   . potassium chloride (K-DUR) 10 MEQ tablet      TAKE 1 TABLET BY MOUTH TWICE A DAY.   60 tablet   5   . potassium chloride (K-DUR,KLOR-CON) 10 MEQ tablet   Oral   Take 1 tablet (10 mEq total) by mouth 2 (two) times daily. Patient taking differently: Take 10 mEq by mouth daily.    60 tablet   0   . predniSONE (DELTASONE) 10 MG tablet      Take 6 Tablets for 2 days, 5 Tablets for 2 days, 4 Tablets for 2 days, 3 Tablets for 2 days, 2 Tablets for 2 days, 1 Tablet for 2 days   42 tablet   0   . ranolazine (RANEXA) 1000 MG SR tablet   Oral   Take 1 tablet (1,000 mg total) by mouth 2 (two) times daily.   60 tablet   2   . tamsulosin (FLOMAX) 0.4 MG CAPS capsule   Oral   Take 0.4 mg by mouth daily.         Marland Kitchen tiotropium (SPIRIVA) 18 MCG inhalation capsule   Inhalation   Place 18 mcg into inhaler and inhale daily.           Allergies Albuterol sulfate; Demerol; Prednisone; Sulfa antibiotics; and Morphine sulfate  Family History  Problem Relation Age of Onset  . Heart attack Mother   . Cirrhosis Mother   . Diabetes Mother   . Depression Mother   . Heart disease Mother   . Heart attack Father   . Diabetes Father   . Depression Father   . Heart disease Father   . Parkinson's disease Brother     Social History Social History  Substance Use Topics  . Smoking status: Former Smoker    Quit date: 04/22/2013  . Smokeless tobacco: Never Used  . Alcohol Use: No     Comment: remotely quit alcohol use. Hx of heavy alcohol use.    Review of Systems Constitutional: No fever/chills Eyes: No visual changes. ENT: No sore throat. Cardiovascular: Denies chest pain. Respiratory: Denies shortness of  breath. Gastrointestinal: No abdominal pain.  No nausea, no vomiting.  No diarrhea.  No constipation. Genitourinary: Negative for dysuria. Musculoskeletal: Positive for right hip pain. Skin: Negative for rash. Neurological: Negative for headaches, focal weakness or numbness.  10-point ROS otherwise negative.  ____________________________________________   PHYSICAL EXAM:  VITAL SIGNS: ED Triage Vitals  Enc Vitals Group     BP 11/27/15 1841 132/84 mmHg     Pulse Rate 11/27/15 1841 81     Resp 11/27/15 1841 20     Temp 11/27/15 1841 97.8 F (36.6 C)     Temp Source 11/27/15 1841 Oral     SpO2 11/27/15 1841 96 %     Weight 11/27/15 1841 225 lb (102.059 kg)     Height 11/27/15 1841 5' 7"  (1.702 m)     Head Cir --  [place] --      Pain Score 11/27/15 1842 9     Pain Loc --      Pain Edu? --      Excl. in [place]? --     Constitutional: Alert and oriented. Well appearing and in no acute distress.  Cardiovascular: Normal rate, regular rhythm. Grossly normal heart sounds.  Good peripheral circulation. Respiratory: Normal respiratory effort.  No retractions. Lungs CTAB. Musculoskeletal: No lower extremity tenderness nor edema.  No joint effusions. Neurologic:  Normal speech and language. No gross focal neurologic deficits are appreciated. No gait instability. Skin:  Skin is warm, dry and intact. No rash noted. Psychiatric: Mood and affect are normal. Speech and behavior are normal.  ____________________________________________   LABS (all labs ordered are listed, but only abnormal results are displayed)  Labs Reviewed - No data to display ____________________________________________  RADIOLOGY  No acute osseous findings. ____________________________________________   PROCEDURES  Procedure(s) performed: None  Critical Care performed: No  ____________________________________________   INITIAL IMPRESSION / ASSESSMENT AND PLAN / ED COURSE  Pertinent labs &  imaging results that were available during my care of the patient were reviewed by me and considered in my medical decision making (see chart for details).  Right hip pain. Rx given for Flexeril 10 mg 3 times a day, Naprosyn 500 mg 2 times a day. Patient follow-up with PCP or return to the ER with any significant symptomology. ____________________________________________   FINAL CLINICAL IMPRESSION(S) / ED DIAGNOSES  Final diagnoses:  Hip pain, acute, right      Arlyss Repress, PA-C 11/27/15 1935  Joanne Gavel, MD 11/27/15 2312

## 2015-11-27 NOTE — Discharge Instructions (Signed)
Heat Therapy Heat therapy can help ease sore, stiff, injured, and tight muscles and joints. Heat relaxes your muscles, which may help ease your pain.  RISKS AND COMPLICATIONS If you have any of the following conditions, do not use heat therapy unless your health care provider has approved:  Poor circulation.  Healing wounds or scarred skin in the area being treated.  Diabetes, heart disease, or high blood pressure.  Not being able to feel (numbness) the area being treated.  Unusual swelling of the area being treated.  Active infections.  Blood clots.  Cancer.  Inability to communicate pain. This may include young children and people who have problems with their brain function (dementia).  Pregnancy. Heat therapy should only be used on old, pre-existing, or long-lasting (chronic) injuries. Do not use heat therapy on new injuries unless directed by your health care provider. HOW TO USE HEAT THERAPY There are several different kinds of heat therapy, including:  Moist heat pack.  Warm water bath.  Hot water bottle.  Electric heating pad.  Heated gel pack.  Heated wrap.  Electric heating pad. Use the heat therapy method suggested by your health care provider. Follow your health care provider's instructions on when and how to use heat therapy. GENERAL HEAT THERAPY RECOMMENDATIONS  Do not sleep while using heat therapy. Only use heat therapy while you are awake.  Your skin may turn pink while using heat therapy. Do not use heat therapy if your skin turns red.  Do not use heat therapy if you have new pain.  High heat or long exposure to heat can cause burns. Be careful when using heat therapy to avoid burning your skin.  Do not use heat therapy on areas of your skin that are already irritated, such as with a rash or sunburn. SEEK MEDICAL CARE IF:  You have blisters, redness, swelling, or numbness.  You have new pain.  Your pain is worse. MAKE SURE  YOU:  Understand these instructions.  Will watch your condition.  Will get help right away if you are not doing well or get worse.   This information is not intended to replace advice given to you by your health care provider. Make sure you discuss any questions you have with your health care provider.   Document Released: 01/25/2012 Document Revised: 11/23/2014 Document Reviewed: 12/26/2013 Elsevier Interactive Patient Education 2016 [place] Pain Joint pain, which is also called arthralgia, can be caused by many things. Joint pain often goes away when you follow your health care provider's instructions for relieving pain at home. However, joint pain can also be caused by conditions that require further treatment. Common causes of joint pain include:  Bruising in the area of the joint.  Overuse of the joint.  Wear and tear on the joints that occur with aging (osteoarthritis).  Various other forms of arthritis.  A buildup of a crystal form of uric acid in the joint (gout).  Infections of the joint (septic arthritis) or of the bone (osteomyelitis). Your health care provider may recommend medicine to help with the pain. If your joint pain continues, additional tests may be needed to diagnose your condition. HOME CARE INSTRUCTIONS Watch your condition for any changes. Follow these instructions as directed to lessen the pain that you are feeling.  Take medicines only as directed by your health care provider.  Rest the affected area for as long as your health care provider says that you should. If directed to do so, raise  the painful joint above the level of your heart while you are sitting or lying down.  Do not do things that cause or worsen pain.  If directed, apply ice to the painful area:  Put ice in a plastic bag.  Place a towel between your skin and the bag.  Leave the ice on for 20 minutes, 2-3 times per day.  Wear an elastic bandage, splint, or sling as  directed by your health care provider. Loosen the elastic bandage or splint if your fingers or toes become numb and tingle, or if they turn cold and blue.  Begin exercising or stretching the affected area as directed by your health care provider. Ask your health care provider what types of exercise are safe for you.  Keep all follow-up visits as directed by your health care provider. This is important. SEEK MEDICAL CARE IF:  Your pain increases, and medicine does not help.  Your joint pain does not improve within 3 days.  You have increased bruising or swelling.  You have a fever.  You lose 10 lb (4.5 kg) or more without trying. SEEK IMMEDIATE MEDICAL CARE IF:  You are not able to move the joint.  Your fingers or toes become numb or they turn cold and blue.   This information is not intended to replace advice given to you by your health care provider. Make sure you discuss any questions you have with your health care provider.   Document Released: 11/02/2005 Document Revised: 11/23/2014 Document Reviewed: 08/14/2014 Elsevier Interactive Patient Education 2016 Elsevier Inc.  Hip Pain Your hip is the joint between your upper legs and your lower pelvis. The bones, cartilage, tendons, and muscles of your hip joint perform a lot of work each day supporting your body weight and allowing you to move around. Hip pain can range from a minor ache to severe pain in one or both of your hips. Pain may be felt on the inside of the hip joint near the groin, or the outside near the buttocks and upper thigh. You may have swelling or stiffness as well.  HOME CARE INSTRUCTIONS   Take medicines only as directed by your health care provider.  Apply ice to the injured area:  Put ice in a plastic bag.  Place a towel between your skin and the bag.  Leave the ice on for 15-20 minutes at a time, 3-4 times a day.  Keep your leg raised (elevated) when possible to lessen swelling.  Avoid activities  that cause pain.  Follow specific exercises as directed by your health care provider.  Sleep with a pillow between your legs on your most comfortable side.  Record how often you have hip pain, the location of the pain, and what it feels like. SEEK MEDICAL CARE IF:   You are unable to put weight on your leg.  Your hip is red or swollen or very tender to touch.  Your pain or swelling continues or worsens after 1 week.  You have increasing difficulty walking.  You have a fever. SEEK IMMEDIATE MEDICAL CARE IF:   You have fallen.  You have a sudden increase in pain and swelling in your hip. MAKE SURE YOU:   Understand these instructions.  Will watch your condition.  Will get help right away if you are not doing well or get worse.   This information is not intended to replace advice given to you by your health care provider. Make sure you discuss any questions you have  with your health care provider.   Document Released: 04/22/2010 Document Revised: 11/23/2014 Document Reviewed: 06/29/2013 Elsevier Interactive Patient Education 2016 Elsevier Inc.  Musculoskeletal Pain Musculoskeletal pain is muscle and boney aches and pains. These pains can occur in any part of the body. Your caregiver may treat you without knowing the cause of the pain. They may treat you if blood or urine tests, X-rays, and other tests were normal.  CAUSES There is often not a definite cause or reason for these pains. These pains may be caused by a type of germ (virus). The discomfort may also come from overuse. Overuse includes working out too hard when your body is not fit. Boney aches also come from weather changes. Bone is sensitive to atmospheric pressure changes. HOME CARE INSTRUCTIONS   Ask when your test results will be ready. Make sure you get your test results.  Only take over-the-counter or prescription medicines for pain, discomfort, or fever as directed by your caregiver. If you were given  medications for your condition, do not drive, operate machinery or power tools, or sign legal documents for 24 hours. Do not drink alcohol. Do not take sleeping pills or other medications that may interfere with treatment.  Continue all activities unless the activities cause more pain. When the pain lessens, slowly resume normal activities. Gradually increase the intensity and duration of the activities or exercise.  During periods of severe pain, bed rest may be helpful. Lay or sit in any position that is comfortable.  Putting ice on the injured area.  Put ice in a bag.  Place a towel between your skin and the bag.  Leave the ice on for 15 to 20 minutes, 3 to 4 times a day.  Follow up with your caregiver for continued problems and no reason can be found for the pain. If the pain becomes worse or does not go away, it may be necessary to repeat tests or do additional testing. Your caregiver may need to look further for a possible cause. SEEK IMMEDIATE MEDICAL CARE IF:  You have pain that is getting worse and is not relieved by medications.  You develop chest pain that is associated with shortness or breath, sweating, feeling sick to your stomach (nauseous), or throw up (vomit).  Your pain becomes localized to the abdomen.  You develop any new symptoms that seem different or that concern you. MAKE SURE YOU:   Understand these instructions.  Will watch your condition.  Will get help right away if you are not doing well or get worse.   This information is not intended to replace advice given to you by your health care provider. Make sure you discuss any questions you have with your health care provider.   Document Released: 11/02/2005 Document Revised: 01/25/2012 Document Reviewed: 07/07/2013 Elsevier Interactive Patient Education [employer].

## 2015-12-02 ENCOUNTER — Emergency Department
Admission: EM | Admit: 2015-12-02 | Discharge: 2015-12-02 | Disposition: A | Discharge status: Home / Self Care | Payer: Medicare PPO | Attending: Emergency Medicine | Admitting: Emergency Medicine

## 2015-12-02 ENCOUNTER — Encounter: Payer: Self-pay | Admitting: *Deleted

## 2015-12-02 ENCOUNTER — Emergency Department: Payer: Medicare PPO

## 2015-12-02 ENCOUNTER — Other Ambulatory Visit: Payer: Self-pay

## 2015-12-02 DIAGNOSIS — Z87891 Personal history of nicotine dependence: Secondary | ICD-10-CM | POA: Insufficient documentation

## 2015-12-02 DIAGNOSIS — E119 Type 2 diabetes mellitus without complications: Secondary | ICD-10-CM | POA: Insufficient documentation

## 2015-12-02 DIAGNOSIS — I10 Essential (primary) hypertension: Secondary | ICD-10-CM | POA: Diagnosis not present

## 2015-12-02 DIAGNOSIS — Z79899 Other long term (current) drug therapy: Secondary | ICD-10-CM | POA: Diagnosis not present

## 2015-12-02 DIAGNOSIS — I2510 Atherosclerotic heart disease of native coronary artery without angina pectoris: Secondary | ICD-10-CM | POA: Diagnosis not present

## 2015-12-02 DIAGNOSIS — Z7984 Long term (current) use of oral hypoglycemic drugs: Secondary | ICD-10-CM | POA: Diagnosis not present

## 2015-12-02 DIAGNOSIS — Z791 Long term (current) use of non-steroidal anti-inflammatories (NSAID): Secondary | ICD-10-CM | POA: Insufficient documentation

## 2015-12-02 DIAGNOSIS — I5032 Chronic diastolic (congestive) heart failure: Secondary | ICD-10-CM | POA: Diagnosis not present

## 2015-12-02 DIAGNOSIS — R079 Chest pain, unspecified: Secondary | ICD-10-CM | POA: Diagnosis present

## 2015-12-02 DIAGNOSIS — R0602 Shortness of breath: Secondary | ICD-10-CM

## 2015-12-02 DIAGNOSIS — J441 Chronic obstructive pulmonary disease with (acute) exacerbation: Secondary | ICD-10-CM | POA: Diagnosis not present

## 2015-12-02 DIAGNOSIS — Z7982 Long term (current) use of aspirin: Secondary | ICD-10-CM | POA: Insufficient documentation

## 2015-12-02 DIAGNOSIS — Z7902 Long term (current) use of antithrombotics/antiplatelets: Secondary | ICD-10-CM | POA: Diagnosis not present

## 2015-12-02 HISTORY — DX: Unspecified atrial fibrillation: I48.91

## 2015-12-02 LAB — CBC
HCT: 39.3 % — ABNORMAL LOW (ref 40.0–52.0)
Hemoglobin: 12.6 g/dL — ABNORMAL LOW (ref 13.0–18.0)
MCH: 26.5 pg (ref 26.0–34.0)
MCHC: 32 g/dL (ref 32.0–36.0)
MCV: 82.9 fL (ref 80.0–100.0)
Platelets: 210 10*3/uL (ref 150–440)
RBC: 4.74 MIL/uL (ref 4.40–5.90)
RDW: 15.2 % — ABNORMAL HIGH (ref 11.5–14.5)
WBC: 5.1 10*3/uL (ref 3.8–10.6)

## 2015-12-02 LAB — BASIC METABOLIC PANEL
Anion gap: 8 (ref 5–15)
BUN: 13 mg/dL (ref 6–20)
CO2: 28 mmol/L (ref 22–32)
Calcium: 9.1 mg/dL (ref 8.9–10.3)
Chloride: 105 mmol/L (ref 101–111)
Creatinine, Ser: 0.94 mg/dL (ref 0.61–1.24)
GFR calc Af Amer: >60 mL/min (ref >60)
GFR calc non Af Amer: >60 mL/min (ref >60)
Glucose, Bld: 115 mg/dL — ABNORMAL HIGH (ref 65–99)
Potassium: 4.1 mmol/L (ref 3.5–5.1)
Sodium: 141 mmol/L (ref 135–145)

## 2015-12-02 LAB — BRAIN NATRIURETIC PEPTIDE: B Natriuretic Peptide: 49 pg/mL (ref 0.0–100.0)

## 2015-12-02 LAB — TROPONIN I
Troponin I: <0.03 ng/mL (ref <0.031)
Troponin I: <0.03 ng/mL (ref <0.031)

## 2015-12-02 MED ORDER — OXYCODONE-ACETAMINOPHEN 5-325 MG PO TABS
ORAL_TABLET | ORAL | Status: AC
  Administered 2015-12-02: 1 via ORAL
  Filled 2015-12-02: qty 1

## 2015-12-02 MED ORDER — IPRATROPIUM-ALBUTEROL 0.5-2.5 (3) MG/3ML IN SOLN
3.0000 mL | Freq: Once | RESPIRATORY_TRACT | Status: AC
  Administered 2015-12-02: 3 mL via RESPIRATORY_TRACT
  Filled 2015-12-02: qty 3

## 2015-12-02 MED ORDER — FUROSEMIDE 10 MG/ML IJ SOLN
40.0000 mg | Freq: Once | INTRAMUSCULAR | Status: AC
Start: 2015-12-02 — End: 2015-12-02
  Administered 2015-12-02: 40 mg via INTRAVENOUS
  Filled 2015-12-02: qty 4

## 2015-12-02 MED ORDER — OXYCODONE-ACETAMINOPHEN 5-325 MG PO TABS
1.0000 | ORAL_TABLET | ORAL | Status: AC
  Administered 2015-12-02: 1 via ORAL

## 2015-12-02 MED ORDER — ASPIRIN 81 MG PO CHEW
324.0000 mg | CHEWABLE_TABLET | Freq: Once | ORAL | Status: AC
  Administered 2015-12-02: 324 mg via ORAL
  Filled 2015-12-02: qty 4

## 2015-12-02 NOTE — ED Provider Notes (Signed)
[facility] Emergency Department Provider Note   ____________________________________________  Time seen: 1740  I have reviewed the triage vital signs and the nursing notes.   HISTORY  Chief Complaint Chest Pain and Shortness of Breath   History limited by: Not Limited   HPI Lawrence [name] is a 60 y.o. male with history of CAD, CHF, COPD who presents to the emergency department with complaints of chest pain, shortness of breath and lower extremity swelling. He states that the swelling started yesterday. It has gradually gotten worse. It is located in the bilateral lower extremities. He tried doubling his dose of lasix without any relief. He started developing SOB yesterday as well. This morning he developed chest pain. It is located centrally. He describes it as a squeezing type pain. It does radiate into his left arm. States it does remind him of his previous episodes of MI. No fevers.   Past Medical History  Diagnosis Date  . CAD (coronary artery disease)     a. 2002 CABGx2 (LIMA->LAD, VG->VG->OM1);  b. 09/2012 DES->OM;  c. 03/2015 PTCA of LAD [facility]) in setting of atretic LIMA; d. 05/2015 Cath [facility]): nonobs dzs; e. 06/2015 Cath ([facility]): LM nl, LAD 45p/d ISR, 50d, D1/2 small, LCX 50p/d ISR, OM1 70ost, 30 ISR, VG->OM1 50ost, 52m LIMA->LAD 99p/d - atretic, RCA dom, nl; f.cath 10/16: 40-50%(FFR 0.90) pLAD, 75% (FFR 0.77) mLAD s/p PCI/DES, oRCA 40% (FFR0.95)  . Essential hypertension   . COPD (chronic obstructive pulmonary disease) ([place]     a. Chronic bronchitis and emphysema.  . Type II diabetes mellitus ([place]   . Asthma   . Celiac disease   . Chronic diastolic CHF (congestive heart failure) ([place]     a. 06/2009 Echo: EF 60-65%, Gr 1 DD, triv AI, mildly dil LA, nl RV.  .Marland KitchenAnxiety   . History of tobacco abuse     a. Quit 2014.  .Marland KitchenPSVT (paroxysmal supraventricular tachycardia) ([place]     a. 10/2012 Noted on Zio Patch.  . Dysrhythmia   . A-fib ([facility]      Patient Active Problem List   Diagnosis Date Noted  . Left inguinal hernia 11/07/2015  . Acute anxiety 11/07/2015  . Acute coronary syndrome ([place] 10/07/2015  . Unstable angina ([place] 10/05/2015  . PAF (paroxysmal atrial fibrillation) ([place] 10/05/2015  . Back pain with left-sided radiculopathy 09/30/2015  . Nocturnal hypoxia 09/06/2015  . BPH (benign prostatic hyperplasia) 08/01/2015  . Essential hypertension   . Chronic diastolic CHF (congestive heart failure) ([place]   . Angina at rest ([facility]   . Pain in the chest   . Chest pain 07/11/2015  . COPD (chronic obstructive pulmonary disease) ([place] 07/03/2015  . CAD (coronary artery disease) 06/26/2015  . HTN (hypertension) 06/26/2015  . DM (diabetes mellitus) ([place] 06/26/2015  . Achalasia 07/24/2014  . GERD (gastroesophageal reflux disease) 06/07/2014  . Former tobacco use 04/11/2013  . Chest pain at rest 04/09/2013  . HLD (hyperlipidemia) 04/09/2013    Past Surgical History  Procedure Laterality Date  . Vascular surgery    . Bypass graft    . Tonsillectomy    . Cholecystectomy    . Cardiac catheterization N/A 07/12/2015    rocedure: Left Heart Cath and Cors/Grafts Angiography;  Surgeon: HBelva Crome MD;  [place];  Service: Cardiovascular;  Laterality: N/A;  . Esophageal dilation    . Cardiac catheterization Right 10/07/2015    Procedure: Left Heart Cath and Cors/Grafts Angiography;  Surgeon:  Dionisio David, MD;  Location: [place] [facility];  Service: Cardiovascular;  Laterality: Right;    Current Outpatient Rx  Name  Route  Sig  Dispense  Refill  . acetaminophen (TYLENOL) 325 MG tablet   Oral   Take 2 tablets (650 mg total) by mouth every 6 (six) hours as needed for mild pain or headache.         . albuterol (PROVENTIL HFA;VENTOLIN HFA) 108 (90 BASE) MCG/ACT inhaler   Inhalation   Inhale 2 puffs into the lungs every 6 (six) hours as needed for wheezing.   1 Inhaler   0   . ALPRAZolam  (XANAX) 1 MG tablet   Oral   Take 0.5-1 tablets (0.5-1 mg total) by mouth 3 (three) times daily as needed for anxiety.   90 tablet   3   . amLODipine (NORVASC) 5 MG tablet   Oral   Take 5 mg by mouth daily.          Marland Kitchen apixaban (ELIQUIS) 5 MG TABS tablet   Oral   Take 5 mg by mouth 2 (two) times daily.         Marland Kitchen aspirin EC 81 MG tablet   Oral   Take 81 mg by mouth every morning.         Marland Kitchen atorvastatin (LIPITOR) 80 MG tablet   Oral   Take 1 tablet (80 mg total) by mouth at bedtime.   90 tablet   3   . carvedilol (COREG) 3.125 MG tablet   Oral   Take 3.125 mg by mouth 2 (two) times daily with a meal.          . cetirizine (ZYRTEC) 10 MG tablet      TAKE ONE TABLET AT BEDTIME   30 tablet   3   . clopidogrel (PLAVIX) 75 MG tablet   Oral   Take 1 tablet (75 mg total) by mouth daily.   30 tablet   5   . cyclobenzaprine (FLEXERIL) 10 MG tablet   Oral   Take 1 tablet (10 mg total) by mouth every 8 (eight) hours as needed for muscle spasms.   30 tablet   1   . furosemide (LASIX) 40 MG tablet   Oral   Take 1 tablet (40 mg total) by mouth 2 (two) times daily. Patient taking differently: Take 40 mg by mouth daily.    60 tablet   0   . isosorbide mononitrate (IMDUR) 60 MG 24 hr tablet   Oral   Take 120 mg by mouth daily.          . lansoprazole (PREVACID) 30 MG capsule   Oral   Take 1 capsule (30 mg total) by mouth every morning.   30 capsule   12   . lisinopril (PRINIVIL,ZESTRIL) 20 MG tablet   Oral   Take 0.5 tablets (10 mg total) by mouth daily.   1 tablet   1   . metFORMIN (GLUCOPHAGE) 500 MG tablet   Oral   Take 1 tablet (500 mg total) by mouth every morning.   90 tablet   4   . metoCLOPramide (REGLAN) 10 MG tablet   Oral   Take 1 tablet (10 mg total) by mouth every 6 (six) hours as needed for nausea.   15 tablet   0   . metoprolol tartrate (LOPRESSOR) 25 MG tablet   Oral   Take 1 tablet (25 mg total) by mouth 2 (two) times daily.  1 tablet   1   . naproxen (NAPROSYN) 500 MG tablet   Oral   Take 1 tablet (500 mg total) by mouth 2 (two) times daily with a meal.   60 tablet   0   . nitroGLYCERIN (NITROSTAT) 0.4 MG SL tablet   Sublingual   Place 1 tablet (0.4 mg total) under the tongue every 5 (five) minutes as needed for chest pain. Every 5 minutes as needed for chest pain   30 tablet   3   . omega-3 acid ethyl esters (LOVAZA) 1 G capsule      TAKE FOUR CAPSULES BY MOUTH DAILY   120 capsule   12   . omega-3 acid ethyl esters (LOVAZA) 1 G capsule   Oral   Take 1 g by mouth 4 (four) times daily.         Marland Kitchen oxyCODONE-acetaminophen (PERCOCET) 10-325 MG tablet   Oral   Take 1 tablet by mouth every 4 (four) hours as needed for pain.   150 tablet   0   . potassium chloride (K-DUR) 10 MEQ tablet      TAKE 1 TABLET BY MOUTH TWICE A DAY.   60 tablet   5   . potassium chloride (K-DUR,KLOR-CON) 10 MEQ tablet   Oral   Take 1 tablet (10 mEq total) by mouth 2 (two) times daily. Patient taking differently: Take 10 mEq by mouth daily.    60 tablet   0   . predniSONE (DELTASONE) 10 MG tablet      Take 6 Tablets for 2 days, 5 Tablets for 2 days, 4 Tablets for 2 days, 3 Tablets for 2 days, 2 Tablets for 2 days, 1 Tablet for 2 days   42 tablet   0   . ranolazine (RANEXA) 1000 MG SR tablet   Oral   Take 1 tablet (1,000 mg total) by mouth 2 (two) times daily.   60 tablet   2   . tamsulosin (FLOMAX) 0.4 MG CAPS capsule   Oral   Take 0.4 mg by mouth daily.         Marland Kitchen tiotropium (SPIRIVA) 18 MCG inhalation capsule   Inhalation   Place 18 mcg into inhaler and inhale daily.           Allergies Albuterol sulfate; Demerol; Prednisone; Sulfa antibiotics; and Morphine sulfate  Family History  Problem Relation Age of Onset  . Heart attack Mother   . Cirrhosis Mother   . Diabetes Mother   . Depression Mother   . Heart disease Mother   . Heart attack Father   . Diabetes Father   . Depression Father    . Heart disease Father   . Parkinson's disease Brother     Social History Social History  Substance Use Topics  . Smoking status: Former Smoker    Quit date: 04/22/2013  . Smokeless tobacco: Never Used  . Alcohol Use: No     Comment: remotely quit alcohol use. Hx of heavy alcohol use.    Review of Systems  Constitutional: Negative for fever. Cardiovascular: Positive for chest pain. Respiratory: Positive for shortness of breath. Gastrointestinal: Negative for abdominal pain, vomiting and diarrhea. Neurological: Negative for headaches, focal weakness or numbness.   10-point ROS otherwise negative.  ____________________________________________   PHYSICAL EXAM:  VITAL SIGNS: ED Triage Vitals  Enc Vitals Group     BP 12/02/15 1543 153/75 mmHg     Pulse Rate 12/02/15 1543 60     Resp  12/02/15 1543 18     Temp 12/02/15 1543 97.7 F (36.5 C)     Temp Source 12/02/15 1543 Oral     SpO2 12/02/15 1543 96 %     Weight 12/02/15 1543 225 lb (102.059 kg)     Height 12/02/15 1543 5' 7"  (1.702 m)     Head Cir --      Peak Flow --      Pain Score 12/02/15 1544 9   Constitutional: Alert and oriented. Well appearing and in no distress. Eyes: Conjunctivae are normal. PERRL. Normal extraocular movements. ENT   Head: Normocephalic and atraumatic.   Nose: No congestion/rhinnorhea.   Mouth/Throat: Mucous membranes are moist.   Neck: No stridor. Hematological/Lymphatic/Immunilogical: No cervical lymphadenopathy. Cardiovascular: Normal rate, regular rhythm.  No murmurs, rubs, or gallops. Respiratory: Normal respiratory effort without tachypnea nor retractions. Diffuse expiratory wheezing. Gastrointestinal: Soft and nontender. No distention.  Genitourinary: Deferred Musculoskeletal: Normal range of motion in all extremities. No joint effusions.  Bilateral 2+ pitting edema.  Neurologic:  Normal speech and language. No gross focal neurologic deficits are appreciated.   Skin:  Skin is warm, dry and intact. No rash noted. Psychiatric: Mood and affect are normal. Speech and behavior are normal. Patient exhibits appropriate insight and judgment.  ____________________________________________    LABS (pertinent positives/negatives)  Labs Reviewed  BASIC METABOLIC PANEL - Abnormal; Notable for the following:    Glucose, Bld 115 (*)    All other components within normal limits  CBC - Abnormal; Notable for the following:    Hemoglobin 12.6 (*)    HCT 39.3 (*)    RDW 15.2 (*)    All other components within normal limits  TROPONIN I    ____________________________________________   EKG  I, Nance Pear, attending physician, personally viewed and interpreted this EKG  EKG Time: 1538 Rate: 62 Rhythm: normal sinus rhythm Axis: normal Intervals: qtc 416 QRS: narrow ST changes: no st elevation Impression: normal ekg  ____________________________________________    RADIOLOGY  CXR IMPRESSION: Post CABG.  Minimal chronic bronchitic changes without infiltrate.  ____________________________________________   PROCEDURES  Procedure(s) performed: None  Critical Care performed: No  ____________________________________________   INITIAL IMPRESSION / ASSESSMENT AND PLAN / ED COURSE  Pertinent labs & imaging results that were available during my care of the patient were reviewed by me and considered in my medical decision making (see chart for details).  Patient here with primary complaint of chest pain and shortness of breath. Patient stated he felt better in terms of his breathing after the breathing treatment. I think he is likely suffering from a slight COPD exacerbation. However the patient is complaining of chest pain that is concerning for ACS. First troponin negative, however given significant cardiac history will plan on admission for further cardiac evaluation.  ____________________________________________   FINAL CLINICAL  IMPRESSION(S) / ED DIAGNOSES  Final diagnoses:  Chest pain, unspecified chest pain type  Shortness of breath     Nance Pear, MD 12/02/15 1916

## 2015-12-02 NOTE — Discharge Instructions (Signed)
Please seek medical attention for any high fevers, chest pain, shortness of breath, change in behavior, persistent vomiting, bloody stool or any other new or concerning symptoms.   Nonspecific Chest Pain  Chest pain can be caused by many different conditions. There is always a chance that your pain could be related to something serious, such as a heart attack or a blood clot in your lungs. Chest pain can also be caused by conditions that are not life-threatening. If you have chest pain, it is very important to follow up with your health care provider. CAUSES  Chest pain can be caused by:  Heartburn.  Pneumonia or bronchitis.  Anxiety or stress.  Inflammation around your heart (pericarditis) or lung (pleuritis or pleurisy).  A blood clot in your lung.  A collapsed lung (pneumothorax). It can develop suddenly on its own (spontaneous pneumothorax) or from trauma to the chest.  Shingles infection (varicella-zoster virus).  Heart attack.  Damage to the bones, muscles, and cartilage that make up your chest wall. This can include:  Bruised bones due to injury.  Strained muscles or cartilage due to frequent or repeated coughing or overwork.  Fracture to one or more ribs.  Sore cartilage due to inflammation (costochondritis). RISK FACTORS  Risk factors for chest pain may include:  Activities that increase your risk for trauma or injury to your chest.  Respiratory infections or conditions that cause frequent coughing.  Medical conditions or overeating that can cause heartburn.  Heart disease or family history of heart disease.  Conditions or health behaviors that increase your risk of developing a blood clot.  Having had chicken pox (varicella zoster). SIGNS AND SYMPTOMS Chest pain can feel like:  Burning or tingling on the surface of your chest or deep in your chest.  Crushing, pressure, aching, or squeezing pain.  Dull or sharp pain that is worse when you move, cough, or  take a deep breath.  Pain that is also felt in your back, neck, shoulder, or arm, or pain that spreads to any of these areas. Your chest pain may come and go, or it may stay constant. DIAGNOSIS Lab tests or other studies may be needed to find the cause of your pain. Your health care provider may have you take a test called an ambulatory ECG (electrocardiogram). An ECG records your heartbeat patterns at the time the test is performed. You may also have other tests, such as:  Transthoracic echocardiogram (TTE). During echocardiography, sound waves are used to create a picture of all of the heart structures and to look at how blood flows through your heart.  Transesophageal echocardiogram (TEE).This is a more advanced imaging test that obtains images from inside your body. It allows your health care provider to see your heart in finer detail.  Cardiac monitoring. This allows your health care provider to monitor your heart rate and rhythm in real time.  Holter monitor. This is a portable device that records your heartbeat and can help to diagnose abnormal heartbeats. It allows your health care provider to track your heart activity for several days, if needed.  Stress tests. These can be done through exercise or by taking medicine that makes your heart beat more quickly.  Blood tests.  Imaging tests. TREATMENT  Your treatment depends on what is causing your chest pain. Treatment may include:  Medicines. These may include:  Acid blockers for heartburn.  Anti-inflammatory medicine.  Pain medicine for inflammatory conditions.  Antibiotic medicine, if an infection is present.  Medicines  to dissolve blood clots.  Medicines to treat coronary artery disease.  Supportive care for conditions that do not require medicines. This may include:  Resting.  Applying heat or cold packs to injured areas.  Limiting activities until pain decreases. HOME CARE INSTRUCTIONS  If you were prescribed  an antibiotic medicine, finish it all even if you start to feel better.  Avoid any activities that bring on chest pain.  Do not use any tobacco products, including cigarettes, chewing tobacco, or electronic cigarettes. If you need help quitting, ask your health care provider.  Do not drink alcohol.  Take medicines only as directed by your health care provider.  Keep all follow-up visits as directed by your health care provider. This is important. This includes any further testing if your chest pain does not go away.  If heartburn is the cause for your chest pain, you may be told to keep your head raised (elevated) while sleeping. This reduces the chance that acid will go from your stomach into your esophagus.  Make lifestyle changes as directed by your health care provider. These may include:  Getting regular exercise. Ask your health care provider to suggest some activities that are safe for you.  Eating a heart-healthy diet. A registered dietitian can help you to learn healthy eating options.  Maintaining a healthy weight.  Managing diabetes, if necessary.  Reducing stress. SEEK MEDICAL CARE IF:  Your chest pain does not go away after treatment.  You have a rash with blisters on your chest.  You have a fever. SEEK IMMEDIATE MEDICAL CARE IF:   Your chest pain is worse.  You have an increasing cough, or you cough up blood.  You have severe abdominal pain.  You have severe weakness.  You faint.  You have chills.  You have sudden, unexplained chest discomfort.  You have sudden, unexplained discomfort in your arms, back, neck, or jaw.  You have shortness of breath at any time.  You suddenly start to sweat, or your skin gets clammy.  You feel nauseous or you vomit.  You suddenly feel light-headed or dizzy.  Your heart begins to beat quickly, or it feels like it is skipping beats. These symptoms may represent a serious problem that is an emergency. Do not wait to  see if the symptoms will go away. Get medical help right away. Call your local emergency services (911 in the [place] Do not drive yourself to the hospital.   This information is not intended to replace advice given to you by your health care provider. Make sure you discuss any questions you have with your health care provider.   Document Released: 08/12/2005 Document Revised: 11/23/2014 Document Reviewed: 06/08/2014 Elsevier Interactive Patient Education [employer].

## 2015-12-02 NOTE — ED Notes (Addendum)
States med chest pain and SOB starting yesterday and worsening today, states swelling in his abd and feet, states hx of CHF, COPD, and emphysema, abd distended upon assessment, states he is 40 lasix daily but has increased to 80 mg for the past few days

## 2015-12-02 NOTE — ED Notes (Signed)
AAOx3.  Skin warm and dry.  No SOB/ DOE.  NAD.  D/C home.  Ambulates with easy and steady gait.

## 2015-12-02 NOTE — Consult Note (Signed)
[place] [facility] [place] NAME: Lawrence [name]    MR#:  [id number]  DATE OF BIRTH:  [birth year]  DATE OF ADMISSION:  12/02/2015  PRIMARY CARE PHYSICIAN: [name], MD   REQUESTING/REFERRING PHYSICIAN: Dr. Archie Balboa  CHIEF COMPLAINT:   Chief Complaint  Patient presents with  . Chest Pain  . Shortness of Breath    HISTORY OF PRESENT ILLNESS: Lawrence [name]  is a 60 y.o. male with a known history of coronary artery disease status post CABG and multiple cardiac catheterization done in the past so far, the last one was in November 2016 in [facility]- which showed some blockages in his coronary arteries but cardiologist suggested this is nothing new and he should be on aggressive medical therapy. Patient is on fully optimized medical therapy on aspirin , Plavix, metoprolol, lisinopril, imdur, statin. He came today with severe chest pain. The pain is central in the chest and constant. His 2 troponins in the emergency room are negative. On further questioning he says that he was admitted to [facility] 2 weeks ago, for similar complaint and after workup and admission date suggested it most likely is a gastric pain and referred him to a GI doctor.  PAST MEDICAL HISTORY:   Past Medical History  Diagnosis Date  . CAD (coronary artery disease)     a. 2002 CABGx2 (LIMA->LAD, VG->VG->OM1);  b. 09/2012 DES->OM;  c. 03/2015 PTCA of LAD [facility]) in setting of atretic LIMA; d. 05/2015 Cath [facility]): nonobs dzs; e. 06/2015 Cath ([facility]): LM nl, LAD 45p/d ISR, 50d, D1/2 small, LCX 50p/d ISR, OM1 70ost, 30 ISR, VG->OM1 50ost, 33m LIMA->LAD 99p/d - atretic, RCA dom, nl; f.cath 10/16: 40-50%(FFR 0.90) pLAD, 75% (FFR 0.77) mLAD s/p PCI/DES, oRCA 40% (FFR0.95)  . Essential hypertension   . COPD (chronic obstructive pulmonary disease) ([place]     a. Chronic bronchitis and emphysema.  . Type II diabetes mellitus ([place]   . Asthma   . Celiac disease   . Chronic diastolic CHF  (congestive heart failure) ([place]     a. 06/2009 Echo: EF 60-65%, Gr 1 DD, triv AI, mildly dil LA, nl RV.  .Marland KitchenAnxiety   . History of tobacco abuse     a. Quit 2014.  .Marland KitchenPSVT (paroxysmal supraventricular tachycardia) ([place]     a. 10/2012 Noted on Zio Patch.  . Dysrhythmia   . A-fib ([facility]     PAST SURGICAL HISTORY: Past Surgical History  Procedure Laterality Date  . Vascular surgery    . Bypass graft    . Tonsillectomy    . Cholecystectomy    . Cardiac catheterization N/A 07/12/2015    rocedure: Left Heart Cath and Cors/Grafts Angiography;  Surgeon: HBelva Crome MD;  [place];  Service: Cardiovascular;  Laterality: N/A;  . Esophageal dilation    . Cardiac catheterization Right 10/07/2015    Procedure: Left Heart Cath and Cors/Grafts Angiography;  Surgeon: SDionisio David MD;  [place];  Service: Cardiovascular;  Laterality: Right;    SOCIAL HISTORY:  Social History  Substance Use Topics  . Smoking status: Former Smoker    Quit date: 04/22/2013  . Smokeless tobacco: Never Used  . Alcohol Use: No     Comment: remotely quit alcohol use. Hx of heavy alcohol use.    FAMILY HISTORY:  Family History  Problem Relation Age of Onset  . Heart attack Mother   . Cirrhosis Mother   .  Diabetes Mother   . Depression Mother   . Heart disease Mother   . Heart attack Father   . Diabetes Father   . Depression Father   . Heart disease Father   . Parkinson's disease Brother     DRUG ALLERGIES:  Allergies  Allergen Reactions  . Demerol [Meperidine] Hives  . Prednisone Hypertension  . Sulfa Antibiotics Hives  . Albuterol Sulfate [Albuterol] Palpitations and Other (See Comments)    Pt still currently uses this medication.    . Morphine Sulfate Nausea And Vomiting, Rash and Other (See Comments)    Pt states that he is only allergic to the tablet form of this medication.      REVIEW OF SYSTEMS:   CONSTITUTIONAL: No fever, fatigue or weakness.   EYES: No blurred or double vision.  EARS, NOSE, AND THROAT: No tinnitus or ear pain.  RESPIRATORY: No cough, shortness of breath, wheezing or hemoptysis.  CARDIOVASCULAR: positive for chest pain, no orthopnea, edema.  GASTROINTESTINAL: No nausea, vomiting, diarrhea or abdominal pain.  GENITOURINARY: No dysuria, hematuria.  ENDOCRINE: No polyuria, nocturia,  HEMATOLOGY: No anemia, easy bruising or bleeding SKIN: No rash or lesion. MUSCULOSKELETAL: No joint pain or arthritis.   NEUROLOGIC: No tingling, numbness, weakness.  PSYCHIATRY: No anxiety or depression.   MEDICATIONS AT HOME:  Prior to Admission medications   Medication Sig Start Date End Date Taking? Authorizing Provider  acetaminophen (TYLENOL) 325 MG tablet Take 2 tablets (650 mg total) by mouth every 6 (six) hours as needed for mild pain or headache. 10/07/15  Yes Nicholes Mango, MD  albuterol (PROVENTIL HFA;VENTOLIN HFA) 108 (90 BASE) MCG/ACT inhaler Inhale 2 puffs into the lungs every 6 (six) hours as needed for wheezing. 07/03/15  Yes Birdie Sons, MD  ALPRAZolam Duanne Moron) 1 MG tablet Take 0.5-1 tablets (0.5-1 mg total) by mouth 3 (three) times daily as needed for anxiety. 10/28/15  Yes Birdie Sons, MD  amLODipine (NORVASC) 5 MG tablet Take 5 mg by mouth daily.    Yes Historical Provider, MD  apixaban (ELIQUIS) 5 MG TABS tablet Take 5 mg by mouth 2 (two) times daily.   Yes Historical Provider, MD  aspirin EC 81 MG tablet Take 81 mg by mouth daily.    Yes Historical Provider, MD  atorvastatin (LIPITOR) 80 MG tablet Take 1 tablet (80 mg total) by mouth at bedtime. 04/25/15  Yes Birdie Sons, MD  cetirizine (ZYRTEC) 10 MG tablet Take 10 mg by mouth at bedtime.   Yes Historical Provider, MD  clopidogrel (PLAVIX) 75 MG tablet Take 1 tablet (75 mg total) by mouth daily. 10/08/15  Yes Nicholes Mango, MD  cyclobenzaprine (FLEXERIL) 10 MG tablet Take 1 tablet (10 mg total) by mouth every 8 (eight) hours as needed for muscle spasms.  11/27/15  Yes Pierce Crane Beers, PA-C  furosemide (LASIX) 40 MG tablet Take 40 mg by mouth daily.   Yes Historical Provider, MD  isosorbide mononitrate (IMDUR) 60 MG 24 hr tablet Take 120 mg by mouth daily.    Yes Historical Provider, MD  lansoprazole (PREVACID) 30 MG capsule Take 30 mg by mouth daily at 12 noon.   Yes Historical Provider, MD  lisinopril (PRINIVIL,ZESTRIL) 10 MG tablet Take 10 mg by mouth daily.   Yes Historical Provider, MD  metFORMIN (GLUCOPHAGE) 500 MG tablet Take 500 mg by mouth daily.   Yes Historical Provider, MD  metoCLOPramide (REGLAN) 10 MG tablet Take 1 tablet (10 mg total) by mouth every 6 (six)  hours as needed for nausea. 10/21/15 10/20/16 Yes [name], MD  metoprolol tartrate (LOPRESSOR) 25 MG tablet Take 1 tablet (25 mg total) by mouth 2 (two) times daily. 11/19/15  Yes Birdie Sons, MD  naproxen (NAPROSYN) 500 MG tablet Take 1 tablet (500 mg total) by mouth 2 (two) times daily with a meal. 11/27/15  Yes Pierce Crane Beers, PA-C  nitroGLYCERIN (NITROSTAT) 0.4 MG SL tablet Place 0.4 mg under the tongue every 5 (five) minutes as needed for chest pain.   Yes Historical Provider, MD  Omega-3 Fatty Acids (FISH OIL) 1000 MG CAPS Take 4,000 mg by mouth daily.   Yes Historical Provider, MD  oxyCODONE-acetaminophen (PERCOCET) 10-325 MG tablet Take 1 tablet by mouth every 4 (four) hours as needed for pain. 11/26/15  Yes Birdie Sons, MD  potassium chloride (K-DUR,KLOR-CON) 10 MEQ tablet Take 10 mEq by mouth daily.   Yes Historical Provider, MD  ranolazine (RANEXA) 1000 MG SR tablet Take 1 tablet (1,000 mg total) by mouth 2 (two) times daily. 09/13/15  Yes Gladstone Lighter, MD  tamsulosin (FLOMAX) 0.4 MG CAPS capsule Take 0.4 mg by mouth at bedtime.    Yes Historical Provider, MD  tiotropium (SPIRIVA) 18 MCG inhalation capsule Place 18 mcg into inhaler and inhale daily.   Yes Historical Provider, MD      PHYSICAL EXAMINATION:   VITAL SIGNS: Blood pressure 125/84, pulse  75, temperature 97.7 F (36.5 C), temperature source Oral, resp. rate 16, height 5' 7"  (1.702 m), weight 102.059 kg (225 lb), SpO2 100 %.  GENERAL:  60 y.o.-year-old patient lying in the bed with no acute distress.  EYES: Pupils equal, round, reactive to light and accommodation. No scleral icterus. Extraocular muscles intact.  HEENT: Head atraumatic, normocephalic. Oropharynx and nasopharynx clear.  NECK:  Supple, no jugular venous distention. No thyroid enlargement, no tenderness.  LUNGS: Normal breath sounds bilaterally, no wheezing, rales,rhonchi or crepitation. No use of accessory muscles of respiration.  CARDIOVASCULAR: S1, S2 normal. No murmurs, rubs, or gallops.  ABDOMEN: Soft, nontender, nondistended. Bowel sounds present. No organomegaly or mass.  EXTREMITIES: Slight pedal edema, no cyanosis, or clubbing.  NEUROLOGIC: Cranial nerves II through XII are intact. Muscle strength 5/5 in all extremities. Sensation intact. Gait not checked.  PSYCHIATRIC: The patient is alert and oriented x 3.  SKIN: Seriously dry skin with a lot of flakes on all over the skin.   LABORATORY PANEL:   CBC  Recent Labs Lab 12/02/15 1545  WBC 5.1  HGB 12.6*  HCT 39.3*  PLT 210  MCV 82.9  MCH 26.5  MCHC 32.0  RDW 15.2*   ------------------------------------------------------------------------------------------------------------------  Chemistries   Recent Labs Lab 12/02/15 1545  NA 141  K 4.1  CL 105  CO2 28  GLUCOSE 115*  BUN 13  CREATININE 0.94  CALCIUM 9.1   ------------------------------------------------------------------------------------------------------------------ estimated creatinine clearance is 94 mL/min (by C-G formula based on Cr of 0.94). ------------------------------------------------------------------------------------------------------------------ No results for input(s): TSH, T4TOTAL, T3FREE, THYROIDAB in the last 72 hours.  Invalid input(s):  FREET3   Coagulation profile No results for input(s): INR, PROTIME in the last 168 hours. ------------------------------------------------------------------------------------------------------------------- No results for input(s): DDIMER in the last 72 hours. -------------------------------------------------------------------------------------------------------------------  Cardiac Enzymes  Recent Labs Lab 12/02/15 1545 12/02/15 1952  TROPONINI <0.03 <0.03   ------------------------------------------------------------------------------------------------------------------ Invalid input(s): POCBNP  ---------------------------------------------------------------------------------------------------------------  Urinalysis    Component Value Date/Time   COLORURINE YELLOW* 10/20/2015 2242   COLORURINE Yellow 03/07/2015 2143   APPEARANCEUR CLEAR* 10/20/2015 2242  APPEARANCEUR Clear 03/07/2015 2143   LABSPEC 1.029 10/20/2015 2242   LABSPEC 1.012 03/07/2015 2143   PHURINE 5.0 10/20/2015 2242   PHURINE 7.0 03/07/2015 2143   GLUCOSEU NEGATIVE 10/20/2015 2242   GLUCOSEU Negative 03/07/2015 2143   HGBUR NEGATIVE 10/20/2015 2242   HGBUR Negative 03/07/2015 2143   BILIRUBINUR NEGATIVE 10/20/2015 2242   BILIRUBINUR Negative 03/07/2015 2143   KETONESUR TRACE* 10/20/2015 2242   KETONESUR Negative 03/07/2015 2143   PROTEINUR NEGATIVE 10/20/2015 2242   PROTEINUR Negative 03/07/2015 2143   NITRITE NEGATIVE 10/20/2015 2242   NITRITE Negative 03/07/2015 2143   LEUKOCYTESUR TRACE* 10/20/2015 2242   LEUKOCYTESUR Negative 03/07/2015 2143     RADIOLOGY: Dg Chest 2 View  12/02/2015  CLINICAL DATA:  Chest pain and shortness of breath beginning yesterday worsening today, BILATERAL lower extremity swelling and abdominal swelling, LEFT arm numbness, history CHF, COPD, coronary artery disease post stenting, diabetes mellitus, asthma, hypertension EXAM: CHEST  2 VIEW COMPARISON:  10/05/2015  FINDINGS: Normal heart size post CABG. Mediastinal contours and pulmonary vascularity normal. Minimal atherosclerotic calcification aorta. Bronchitic changes without infiltrate, pleural effusion or pneumothorax. Bones appear demineralized without acute focal abnormality. IMPRESSION: Post CABG. Minimal chronic bronchitic changes without infiltrate. Electronically Signed   By: Lavonia Dana M.D.   On: 12/02/2015 16:57    IMPRESSION AND PLAN:  * Chest pain  Patient is very well known to have coronary artery disease and multiple interventions done so far, he has innumerable visits to emergency room for the same reasons. Recent admissions where in November 2016 in [facility], and in January 2017 in [facility]- for same reason.  Cardiac catheterization was done in November 2016 in [facility]- suggested no new findings. He is already known to have some blockages in his coronary arteries. An cardiologist again emphasized on aggressive medical management. At [facility] also he was told after workup, most likely he has a gastric problem and he should go and see a GI doctor.  He is already on optimized medical therapy of aspirin, Plavix, Eliquis, lisinopril, metoprolol, Imdur, nitroglycerin as needed, statin.  His stool troponins are negative in emergency room, which rules out any acute event.  I recommend to discharge the patient home with pain medications.  He can follow with his cardiologist as outpatient.  * Hypertension- continue home medications * Diabetes- continue home medications * Chronic pain- he is on Percocet at home.  All the records are reviewed and case discussed with ED provider. Management plans discussed with the patient, family and they are in agreement.  CODE STATUS: Code Status History    Date Active Date Inactive Code Status Order ID Comments User Context   10/07/2015  9:36 AM 10/07/2015  6:30 PM Full Code [id number]  Dionisio David, MD Inpatient   10/07/2015  8:26 AM 10/07/2015  9:36 AM Full Code  [id number]  Dionisio David, MD Inpatient   10/05/2015  7:29 PM 10/07/2015  8:26 AM Full Code [id number]  Idelle Crouch, MD Inpatient   09/12/2015  9:29 PM 09/14/2015  2:25 PM Full Code [id number]  Demetrios Loll, MD Inpatient   08/01/2015  5:21 AM 08/02/2015  1:46 PM Full Code [id number]  Lance Coon, MD Inpatient   07/12/2015  3:24 PM 07/13/2015  8:17 PM Full Code [id number]  Belva Crome, MD Inpatient   07/11/2015  8:00 PM 07/12/2015  3:24 PM Full Code [id number]  Lonn Georgia, PA-C Inpatient   06/26/2015  5:43 AM 06/27/2015  3:30 PM Full Code [id number]  Neomia Dear  Marchia Meiers, MD Inpatient       TOTAL TIME TAKING CARE OF THIS PATIENT: 50 minutes.    Vaughan Basta M.D on 12/02/2015   Between 7am to 6pm - Pager - [phone]  After 6pm go to [email or web address] - password [email or web address] [facility]  Office  [phone]  CC: Primary care physician; [name], MD   Note: This dictation was prepared with Dragon dictation along with smaller phrase technology. Any transcriptional errors that result from this process are unintentional.

## 2015-12-10 ENCOUNTER — Encounter: Payer: Self-pay | Admitting: Family Medicine

## 2015-12-10 DIAGNOSIS — G4733 Obstructive sleep apnea (adult) (pediatric): Secondary | ICD-10-CM | POA: Insufficient documentation

## 2015-12-23 DIAGNOSIS — I480 Paroxysmal atrial fibrillation: Secondary | ICD-10-CM

## 2015-12-23 DIAGNOSIS — I4891 Unspecified atrial fibrillation: Secondary | ICD-10-CM | POA: Insufficient documentation

## 2015-12-26 ENCOUNTER — Other Ambulatory Visit: Payer: Self-pay | Admitting: Family Medicine

## 2015-12-26 DIAGNOSIS — M5410 Radiculopathy, site unspecified: Secondary | ICD-10-CM

## 2015-12-26 MED ORDER — OXYCODONE-ACETAMINOPHEN 10-325 MG PO TABS: 1.0000 | ORAL_TABLET | ORAL | Status: DC | PRN

## 2015-12-26 NOTE — Telephone Encounter (Signed)
Pt needs refill oxyCODONE-acetaminophen (PERCOCET) 10-325 MG tablet 12/02/2015 11/26/15 -- Birdie Sons, MD Take 1 tablet by mouth every 4 (four) hours as needed for pain.   Thanks Con Memos

## 2015-12-27 ENCOUNTER — Telehealth: Payer: Self-pay | Admitting: Family Medicine

## 2015-12-27 NOTE — Telephone Encounter (Signed)
**  FYI** Pt is scheduled for hospital F/U with Dr. [name] on 01/02/16. Pt was DC on 12/25/15 and went to hospital for chest pain. Thanks TNP

## 2016-01-02 ENCOUNTER — Encounter: Payer: Self-pay | Admitting: Family Medicine

## 2016-01-02 ENCOUNTER — Ambulatory Visit (INDEPENDENT_AMBULATORY_CARE_PROVIDER_SITE_OTHER): Payer: Medicare PPO | Admitting: Family Medicine

## 2016-01-02 VITALS — BP 118/72 | HR 56 | Temp 97.5°F | Resp 18 | Wt 242.0 lb

## 2016-01-02 DIAGNOSIS — J449 Chronic obstructive pulmonary disease, unspecified: Secondary | ICD-10-CM

## 2016-01-02 DIAGNOSIS — G4733 Obstructive sleep apnea (adult) (pediatric): Secondary | ICD-10-CM | POA: Diagnosis not present

## 2016-01-02 DIAGNOSIS — I480 Paroxysmal atrial fibrillation: Secondary | ICD-10-CM

## 2016-01-02 DIAGNOSIS — I25118 Atherosclerotic heart disease of native coronary artery with other forms of angina pectoris: Secondary | ICD-10-CM

## 2016-01-02 DIAGNOSIS — I10 Essential (primary) hypertension: Secondary | ICD-10-CM

## 2016-01-02 NOTE — Progress Notes (Signed)
Patient: Lawrence [name] Male    DOB: 04-09-1956   60 y.o.   MRN: [id number] Visit Date: 01/02/2016  Today's Provider: [name], MD   Chief Complaint  Patient presents with  . Hospitalization Follow-up  . Congestive Heart Failure  . Chest Pain   Subjective:    HPI   Follow up Hospitalization/ CHF  Patient was admitted to [facility] on 12/22/2015 with chest pain and discharged on 12/25/2015. He rules out for MI He was treated for A-Fib and diastolic CHF Treatment for this included starting Sotalol and magnesium oxide and discontinuation of metoprolol. Amlodipine was also stopped as he had a syncopal episode and BP was at goal.   He had echo with normal EF and mildly dilated left atrium.  He was previously started on Eliquis, and ASA was discontinued at resent hospitalization.   He reports good compliance with treatment. He reports this condition is Improved. Patient still has shortness of breath, fatigue, dyspnea, and palpitations  on exertion. Has occasional chest pains. Is scheduled to follow up with [facility] on 01/06/15.   He has to use albuterol inhaler a few times a day. He states his pulmonlogist changed Advair to [facility] a few months ago, and states he is taking maintenance inhalers consistently.  ------------------------------------------------------------------------------------      Allergies  Allergen Reactions  . Demerol [Meperidine] Hives  . Prednisone Hypertension  . Sulfa Antibiotics Hives  . Albuterol Sulfate [Albuterol] Palpitations and Other (See Comments)    Pt still currently uses this medication.    . Morphine Sulfate Nausea And Vomiting, Rash and Other (See Comments)    Pt states that he is only allergic to the tablet form of this medication.     Previous Medications   ACETAMINOPHEN (TYLENOL) 325 MG TABLET    Take 2 tablets (650 mg total) by mouth every 6 (six) hours as needed for mild pain or headache.   ALBUTEROL (PROVENTIL HFA;VENTOLIN  HFA) 108 (90 BASE) MCG/ACT INHALER    Inhale 2 puffs into the lungs every 6 (six) hours as needed for wheezing.   ALPRAZOLAM (XANAX) 1 MG TABLET    Take 0.5-1 tablets (0.5-1 mg total) by mouth 3 (three) times daily as needed for anxiety.   APIXABAN (ELIQUIS) 5 MG TABS TABLET    Take 5 mg by mouth 2 (two) times daily.   ATORVASTATIN (LIPITOR) 80 MG TABLET    Take 1 tablet (80 mg total) by mouth at bedtime.   CETIRIZINE (ZYRTEC) 10 MG TABLET    Take 10 mg by mouth at bedtime.   CITALOPRAM (CELEXA) 20 MG TABLET    Take 20 mg by mouth daily.   CLOPIDOGREL (PLAVIX) 75 MG TABLET    Take 1 tablet (75 mg total) by mouth daily.   CYCLOBENZAPRINE (FLEXERIL) 10 MG TABLET    Take 1 tablet (10 mg total) by mouth every 8 (eight) hours as needed for muscle spasms.   ISOSORBIDE MONONITRATE (IMDUR) 60 MG 24 HR TABLET    Take 120 mg by mouth daily.    LANSOPRAZOLE (PREVACID) 30 MG CAPSULE    Take 30 mg by mouth daily at 12 noon.   LISINOPRIL (PRINIVIL,ZESTRIL) 5 MG TABLET    Take 5 mg by mouth daily.   MAGNESIUM OXIDE (MAG-OX) 400 MG TABLET    Take 400 mg by mouth.   METFORMIN (GLUCOPHAGE) 500 MG TABLET    Take 500 mg by mouth daily.   METOCLOPRAMIDE (REGLAN) 10 MG  TABLET    Take 1 tablet (10 mg total) by mouth every 6 (six) hours as needed for nausea.   MOMETASONE-FORMOTEROL (DULERA) 200-5 MCG/ACT AERO    Inhale 1 puff into the lungs 2 (two) times daily.   NAPROXEN (NAPROSYN) 500 MG TABLET    Take 1 tablet (500 mg total) by mouth 2 (two) times daily with a meal.   NITROGLYCERIN (NITROSTAT) 0.4 MG SL TABLET    Place 0.4 mg under the tongue every 5 (five) minutes as needed for chest pain.   OMEGA-3 ACID ETHYL ESTERS (LOVAZA) 1 G CAPSULE    Take 4 g by mouth daily.   OXYCODONE-ACETAMINOPHEN (PERCOCET) 10-325 MG TABLET    Take 1 tablet by mouth every 4 (four) hours as needed for pain.   POTASSIUM CHLORIDE (K-DUR,KLOR-CON) 10 MEQ TABLET    Take 10 mEq by mouth daily.   RANOLAZINE (RANEXA) 1000 MG SR TABLET    Take 1  tablet (1,000 mg total) by mouth 2 (two) times daily.   SOTALOL (BETAPACE) 120 MG TABLET    Take 120 mg by mouth.   TAMSULOSIN (FLOMAX) 0.4 MG CAPS CAPSULE    Take 0.4 mg by mouth at bedtime.    TIOTROPIUM (SPIRIVA) 18 MCG INHALATION CAPSULE    Place 18 mcg into inhaler and inhale daily.    Review of Systems  Constitutional: Positive for fatigue. Negative for fever, chills and appetite change.  Eyes: Negative for visual disturbance.  Respiratory: Positive for cough, shortness of breath and wheezing. Negative for chest tightness.   Cardiovascular: Positive for chest pain, palpitations and leg swelling.  Gastrointestinal: Negative for nausea, vomiting and abdominal pain.  Neurological: Positive for weakness, light-headedness and headaches.    Social History  Substance Use Topics  . Smoking status: Former Smoker    Quit date: 04/22/2013  . Smokeless tobacco: Never Used  . Alcohol Use: No     Comment: remotely quit alcohol use. Hx of heavy alcohol use.   Objective:   BP 118/72 mmHg  Pulse 56  Temp(Src) 97.5 F (36.4 C) (Oral)  Resp 18  Wt 242 lb (109.77 kg)  SpO2 96%   Filed Vitals:   01/02/16 0855 01/02/16 0937  BP: 118/72   Pulse: 56   Temp: 97.5 F (36.4 C)   TempSrc: Oral   Resp: 18   Weight: 242 lb (109.77 kg)   SpO2: 96% at rest on room air 87% with ambulation     Physical Exam   General Appearance:    Alert, cooperative, no distress  Eyes:    PERRL, conjunctiva/corneas clear, EOM's intact       Lungs:     Occasional expiratory wheezes, no rales or rhonchi, slightly decrease air movement., respirations unlabored  Heart:    Regular rhythm, bradycardic. Heart sounds distant.   Neurologic:   Awake, alert, oriented x 3. No apparent focal neurological           defect.         Oximetry while ambulating = 87% on room air     = 94% with 2lpm O2        Assessment & Plan:     1. Coronary artery disease involving native coronary artery of native heart with  other form of angina pectoris ([place]) Follow up with cardiology next week as scheduled  2. Essential hypertension Well controlled since stopping amloidpine. Continue current medications.    3. Chronic obstructive pulmonary disease, unspecified COPD type ([place]) Hypoxic with palpitations  and chest pain when ambulating, Start portable oxygen with exertion. Continue maintenance inhalers. Continue follow up with pulmonologist as scheduled.   4. OSA (obstructive sleep apnea) Now on CPAP and using consistently  5. Paroxysmal atrial fibrillation (HCC) Stable on Sotolol. Rate controlled and anticoagulated. Follow up cardiology as scheduled.   Follow up 1 month for diabetes and lipid management.     Addressed extensive list of chronic and acute medical problems today requiring extensive time in counseling and coordination care.  Over half of this 45 minute visit were spent in counseling and coordinating care of multiple medical problems.       [name], MD  [facility]

## 2016-01-02 NOTE — Patient Instructions (Signed)
We ordered Oxygen to use when you are walking or exerting your self.

## 2016-01-03 ENCOUNTER — Telehealth: Payer: Self-pay | Admitting: Family Medicine

## 2016-01-03 NOTE — Telephone Encounter (Signed)
I though we ordered oxygen. He needs it when amubulating.

## 2016-01-03 NOTE — Telephone Encounter (Signed)
Pt called saying Lawrence [name] brought him the oxygen tanks and he can not use them.  He needs the small concentrator.  He said he needs an order for one of the portable ones.  His call back is [phone]  Thanks teri

## 2016-01-03 NOTE — Telephone Encounter (Signed)
Please advise 

## 2016-01-06 ENCOUNTER — Emergency Department: Payer: Medicare PPO

## 2016-01-06 DIAGNOSIS — I10 Essential (primary) hypertension: Secondary | ICD-10-CM | POA: Insufficient documentation

## 2016-01-06 DIAGNOSIS — Z7951 Long term (current) use of inhaled steroids: Secondary | ICD-10-CM | POA: Insufficient documentation

## 2016-01-06 DIAGNOSIS — Z7902 Long term (current) use of antithrombotics/antiplatelets: Secondary | ICD-10-CM | POA: Diagnosis not present

## 2016-01-06 DIAGNOSIS — J441 Chronic obstructive pulmonary disease with (acute) exacerbation: Secondary | ICD-10-CM | POA: Diagnosis not present

## 2016-01-06 DIAGNOSIS — R001 Bradycardia, unspecified: Secondary | ICD-10-CM | POA: Insufficient documentation

## 2016-01-06 DIAGNOSIS — Z791 Long term (current) use of non-steroidal anti-inflammatories (NSAID): Secondary | ICD-10-CM | POA: Diagnosis not present

## 2016-01-06 DIAGNOSIS — Z7984 Long term (current) use of oral hypoglycemic drugs: Secondary | ICD-10-CM | POA: Insufficient documentation

## 2016-01-06 DIAGNOSIS — E119 Type 2 diabetes mellitus without complications: Secondary | ICD-10-CM | POA: Diagnosis not present

## 2016-01-06 DIAGNOSIS — Z87891 Personal history of nicotine dependence: Secondary | ICD-10-CM | POA: Diagnosis not present

## 2016-01-06 DIAGNOSIS — E669 Obesity, unspecified: Secondary | ICD-10-CM | POA: Insufficient documentation

## 2016-01-06 DIAGNOSIS — Z79899 Other long term (current) drug therapy: Secondary | ICD-10-CM | POA: Insufficient documentation

## 2016-01-06 DIAGNOSIS — R079 Chest pain, unspecified: Secondary | ICD-10-CM | POA: Diagnosis present

## 2016-01-06 LAB — CBC
HCT: 36.8 % — ABNORMAL LOW (ref 40.0–52.0)
Hemoglobin: 11.9 g/dL — ABNORMAL LOW (ref 13.0–18.0)
MCH: 25.6 pg — ABNORMAL LOW (ref 26.0–34.0)
MCHC: 32.4 g/dL (ref 32.0–36.0)
MCV: 79 fL — ABNORMAL LOW (ref 80.0–100.0)
Platelets: 217 10*3/uL (ref 150–440)
RBC: 4.66 MIL/uL (ref 4.40–5.90)
RDW: 16 % — ABNORMAL HIGH (ref 11.5–14.5)
WBC: 6 10*3/uL (ref 3.8–10.6)

## 2016-01-06 LAB — BASIC METABOLIC PANEL
Anion gap: 5 (ref 5–15)
BUN: 17 mg/dL (ref 6–20)
CO2: 30 mmol/L (ref 22–32)
Calcium: 9.1 mg/dL (ref 8.9–10.3)
Chloride: 106 mmol/L (ref 101–111)
Creatinine, Ser: 1 mg/dL (ref 0.61–1.24)
GFR calc Af Amer: >60 mL/min (ref >60)
GFR calc non Af Amer: >60 mL/min (ref >60)
Glucose, Bld: 112 mg/dL — ABNORMAL HIGH (ref 65–99)
Potassium: 4.3 mmol/L (ref 3.5–5.1)
Sodium: 141 mmol/L (ref 135–145)

## 2016-01-06 LAB — TROPONIN I: Troponin I: <0.03 ng/mL (ref <0.031)

## 2016-01-06 LAB — BRAIN NATRIURETIC PEPTIDE: B Natriuretic Peptide: 160 pg/mL — ABNORMAL HIGH (ref 0.0–100.0)

## 2016-01-06 NOTE — ED Notes (Signed)
Patient ambulatory to triage with steady gait, without difficulty or distress noted; pt reports mid CP radiating around into back today; also reports LE/abd swelling since Friday; st SOB with exertion

## 2016-01-07 ENCOUNTER — Encounter: Payer: Self-pay | Admitting: Emergency Medicine

## 2016-01-07 ENCOUNTER — Emergency Department
Admission: EM | Admit: 2016-01-07 | Discharge: 2016-01-07 | Disposition: A | Discharge status: Home / Self Care | Payer: Medicare PPO | Attending: Emergency Medicine | Admitting: Emergency Medicine

## 2016-01-07 DIAGNOSIS — J441 Chronic obstructive pulmonary disease with (acute) exacerbation: Secondary | ICD-10-CM | POA: Diagnosis not present

## 2016-01-07 LAB — TROPONIN I: Troponin I: 0.03 ng/mL (ref <0.031)

## 2016-01-07 MED ORDER — ALBUTEROL SULFATE HFA 108 (90 BASE) MCG/ACT IN AERS: INHALATION_SPRAY | RESPIRATORY_TRACT | Status: DC

## 2016-01-07 MED ORDER — PREDNISONE 20 MG PO TABS
60.0000 mg | ORAL_TABLET | ORAL | Status: AC
  Administered 2016-01-07: 60 mg via ORAL
  Filled 2016-01-07: qty 3

## 2016-01-07 MED ORDER — PREDNISONE 20 MG PO TABS: 60.0000 mg | ORAL_TABLET | Freq: Every day | ORAL | Status: DC

## 2016-01-07 MED ORDER — IPRATROPIUM-ALBUTEROL 0.5-2.5 (3) MG/3ML IN SOLN
3.0000 mL | Freq: Once | RESPIRATORY_TRACT | Status: AC
  Administered 2016-01-07: 3 mL via RESPIRATORY_TRACT
  Filled 2016-01-07: qty 3

## 2016-01-07 NOTE — ED Provider Notes (Signed)
[facility] Emergency Department Provider Note  ____________________________________________  Time seen: Approximately 1:33 AM  I have reviewed the triage vital signs and the nursing notes.   HISTORY  Chief Complaint Chest Pain    HPI Lawrence [name] is a 60 y.o. male with extensive past medical history that includes CAD, COPD, asthma, diabetes, and mild CHF with no significant ejection fraction loss.  He presents with gradual onset of dyspnea on exertion over the last several days as well as some mid chest pain radiating around the left side into his back that started today.  He does state, however, that this type of chest pain is common for him.  He has not been ill recently and denies fever/chills, abdominal pain, nausea, vomiting, episodes of diaphoresis, dysuria.  He states that exertion makes his shortness of breath worse and nothing makes it better.  He no longer smokes but he does have COPD and uses his inhalers but has not been on prednisone recently.He is currently resting comfortably and was asleep but he awoke to soft voice and gentle touch.   Past Medical History  Diagnosis Date  . CAD (coronary artery disease)     a. 2002 CABGx2 (LIMA->LAD, VG->VG->OM1);  b. 09/2012 DES->OM;  c. 03/2015 PTCA of LAD [facility]) in setting of atretic LIMA; d. 05/2015 Cath [facility]): nonobs dzs; e. 06/2015 Cath ([facility]): LM nl, LAD 45p/d ISR, 50d, D1/2 small, LCX 50p/d ISR, OM1 70ost, 30 ISR, VG->OM1 50ost, 62m LIMA->LAD 99p/d - atretic, RCA dom, nl; f.cath 10/16: 40-50%(FFR 0.90) pLAD, 75% (FFR 0.77) mLAD s/p PCI/DES, oRCA 40% (FFR0.95)  . Essential hypertension   . COPD (chronic obstructive pulmonary disease) ([place]     a. Chronic bronchitis and emphysema.  . Type II diabetes mellitus ([place]   . Asthma   . Celiac disease   . Chronic diastolic CHF (congestive heart failure) ([place]     a. 06/2009 Echo: EF 60-65%, Gr 1 DD, triv AI, mildly dil LA, nl RV.  .Marland KitchenAnxiety   . History of  tobacco abuse     a. Quit 2014.  .Marland KitchenPSVT (paroxysmal supraventricular tachycardia) ([place]     a. 10/2012 Noted on Zio Patch.  . Dysrhythmia   . A-fib ([facility]     Patient Active Problem List   Diagnosis Date Noted  . Paroxysmal atrial fibrillation ([place] 12/23/2015  . OSA (obstructive sleep apnea) 12/10/2015  . Left inguinal hernia 11/07/2015  . Acute anxiety 11/07/2015  . Unstable angina ([place] 10/05/2015  . PAF (paroxysmal atrial fibrillation) (HC[place] 10/05/2015  . Back pain with left-sided radiculopathy 09/30/2015  . Nocturnal hypoxia 09/06/2015  . BPH (benign prostatic hyperplasia) 08/01/2015  . Chronic diastolic CHF (congestive heart failure) ([place]   . Angina at rest ([facility]   . Chest pain 07/11/2015  . COPD (chronic obstructive pulmonary disease) ([place] 07/03/2015  . CAD (coronary artery disease) 06/26/2015  . HTN (hypertension) 06/26/2015  . DM (diabetes mellitus) ([place] 06/26/2015  . Achalasia 07/24/2014  . GERD (gastroesophageal reflux disease) 06/07/2014  . Former tobacco use 04/11/2013  . Chest pain at rest 04/09/2013  . HLD (hyperlipidemia) 04/09/2013    Past Surgical History  Procedure Laterality Date  . Vascular surgery    . Bypass graft    . Tonsillectomy    . Cholecystectomy    . Cardiac catheterization N/A 07/12/2015    rocedure: Left Heart Cath and Cors/Grafts Angiography;  Surgeon: HBelva Crome MD;  [facility];  Service: Cardiovascular;  Laterality: N/A;  . Esophageal dilation    . Cardiac catheterization Right 10/07/2015    Procedure: Left Heart Cath and Cors/Grafts Angiography;  Surgeon: Dionisio David, MD;  Location: [facility];  Service: Cardiovascular;  Laterality: Right;    Current Outpatient Rx  Name  Route  Sig  Dispense  Refill  . acetaminophen (TYLENOL) 325 MG tablet   Oral   Take 2 tablets (650 mg total) by mouth every 6 (six) hours as needed for mild pain or headache.         . albuterol (PROVENTIL HFA;VENTOLIN HFA)  108 (90 Base) MCG/ACT inhaler      Inhale 4-6 puffs by mouth every 4 hours as needed for wheezing, cough, and/or shortness of breath   1 Inhaler   1   . ALPRAZolam (XANAX) 1 MG tablet   Oral   Take 0.5-1 tablets (0.5-1 mg total) by mouth 3 (three) times daily as needed for anxiety.   90 tablet   3   . apixaban (ELIQUIS) 5 MG TABS tablet   Oral   Take 5 mg by mouth 2 (two) times daily.         Marland Kitchen atorvastatin (LIPITOR) 80 MG tablet   Oral   Take 1 tablet (80 mg total) by mouth at bedtime.   90 tablet   3   . cetirizine (ZYRTEC) 10 MG tablet   Oral   Take 10 mg by mouth at bedtime.         . citalopram (CELEXA) 20 MG tablet   Oral   Take 20 mg by mouth daily.         . clopidogrel (PLAVIX) 75 MG tablet   Oral   Take 1 tablet (75 mg total) by mouth daily.   30 tablet   5   . cyclobenzaprine (FLEXERIL) 10 MG tablet   Oral   Take 1 tablet (10 mg total) by mouth every 8 (eight) hours as needed for muscle spasms.   30 tablet   1   . isosorbide mononitrate (IMDUR) 60 MG 24 hr tablet   Oral   Take 120 mg by mouth daily.          . lansoprazole (PREVACID) 30 MG capsule   Oral   Take 30 mg by mouth daily at 12 noon.         . magnesium oxide (MAG-OX) 400 MG tablet   Oral   Take 400 mg by mouth.         . metFORMIN (GLUCOPHAGE) 500 MG tablet   Oral   Take 500 mg by mouth daily.         . metoCLOPramide (REGLAN) 10 MG tablet   Oral   Take 1 tablet (10 mg total) by mouth every 6 (six) hours as needed for nausea.   15 tablet   0   . mometasone-formoterol (DULERA) 200-5 MCG/ACT AERO   Inhalation   Inhale 1 puff into the lungs 2 (two) times daily.         . naproxen (NAPROSYN) 500 MG tablet   Oral   Take 1 tablet (500 mg total) by mouth 2 (two) times daily with a meal.   60 tablet   0   . nitroGLYCERIN (NITROSTAT) 0.4 MG SL tablet   Sublingual   Place 0.4 mg under the tongue every 5 (five) minutes as needed for chest pain.         Marland Kitchen  omega-3 acid ethyl esters (LOVAZA)  1 g capsule   Oral   Take 4 g by mouth daily.         Marland Kitchen oxyCODONE-acetaminophen (PERCOCET) 10-325 MG tablet   Oral   Take 1 tablet by mouth every 4 (four) hours as needed for pain.   150 tablet   0   . potassium chloride (K-DUR,KLOR-CON) 10 MEQ tablet   Oral   Take 10 mEq by mouth daily.         . predniSONE (DELTASONE) 20 MG tablet   Oral   Take 3 tablets (60 mg total) by mouth daily.   15 tablet   0   . ranolazine (RANEXA) 1000 MG SR tablet   Oral   Take 1 tablet (1,000 mg total) by mouth 2 (two) times daily.   60 tablet   2   . sotalol (BETAPACE) 120 MG tablet   Oral   Take 120 mg by mouth.         . tamsulosin (FLOMAX) 0.4 MG CAPS capsule   Oral   Take 0.4 mg by mouth at bedtime.          Marland Kitchen tiotropium (SPIRIVA) 18 MCG inhalation capsule   Inhalation   Place 18 mcg into inhaler and inhale daily.           Allergies Demerol; Prednisone; Sulfa antibiotics; Albuterol sulfate; and Morphine sulfate  Family History  Problem Relation Age of Onset  . Heart attack Mother   . Cirrhosis Mother   . Diabetes Mother   . Depression Mother   . Heart disease Mother   . Heart attack Father   . Diabetes Father   . Depression Father   . Heart disease Father   . Parkinson's disease Brother     Social History Social History  Substance Use Topics  . Smoking status: Former Smoker    Quit date: 04/22/2013  . Smokeless tobacco: Never Used  . Alcohol Use: No     Comment: remotely quit alcohol use. Hx of heavy alcohol use.    Review of Systems Constitutional: No fever/chills Eyes: No visual changes. ENT: No sore throat. Cardiovascular: Mild sharp intermittent chest pain radiating to back Respiratory: shortness of breath with exertion Gastrointestinal: No abdominal pain.  No nausea, no vomiting.  No diarrhea.  No constipation. Genitourinary: Negative for dysuria. Musculoskeletal: Negative for back pain. Skin: Negative for  rash. Neurological: Negative for headaches, focal weakness or numbness.  10-point ROS otherwise negative.  ____________________________________________   PHYSICAL EXAM:  VITAL SIGNS: ED Triage Vitals  Enc Vitals Group     BP 01/06/16 2021 155/78 mmHg     Pulse Rate 01/06/16 2021 55     Resp 01/06/16 2021 20     Temp 01/06/16 2021 97.7 F (36.5 C)     Temp Source 01/06/16 2021 Oral     SpO2 01/06/16 2021 98 %     Weight 01/06/16 2021 241 lb (109.317 kg)     Height --      Head Cir --      Peak Flow --      Pain Score 01/06/16 2017 9     Pain Loc --      Pain Edu? --      Excl. in [place]? --     Constitutional: Alert and oriented. No acute distress. Eyes: Conjunctivae are normal. PERRL. EOMI. Head: Atraumatic. Nose: No congestion/rhinnorhea. Mouth/Throat: Mucous membranes are moist.  Oropharynx non-erythematous. Neck: No stridor.   Cardiovascular: Borderline bradycardia, regular rhythm. Grossly  normal heart sounds.  Good peripheral circulation. Respiratory: Normal respiratory effort.  No retractions. Mild wheezing throughout lung fields tonight Gastrointestinal: Obese, Soft and nontender.  Musculoskeletal: No lower extremity tenderness nor edema.  No joint effusions. Neurologic:  Normal speech and language. No gross focal neurologic deficits are appreciated.  Skin:  Skin is warm, dry and intact. No rash noted. Psychiatric: Mood and affect are normal. Speech and behavior are normal.  ____________________________________________   LABS (all labs ordered are listed, but only abnormal results are displayed)  Labs Reviewed  BASIC METABOLIC PANEL - Abnormal; Notable for the following:    Glucose, Bld 112 (*)    All other components within normal limits  CBC - Abnormal; Notable for the following:    Hemoglobin 11.9 (*)    HCT 36.8 (*)    MCV 79.0 (*)    MCH 25.6 (*)    RDW 16.0 (*)    All other components within normal limits  BRAIN NATRIURETIC PEPTIDE - Abnormal;  Notable for the following:    B Natriuretic Peptide 160.0 (*)    All other components within normal limits  TROPONIN I  TROPONIN I   ____________________________________________  EKG  ED ECG REPORT I, Kobe Jansma, the attending physician, personally viewed and interpreted this ECG.  Date: 01/06/2016 EKG Time: 20:21 Rate: 57 Rhythm: Borderline sinus bradycardia QRS Axis: normal Intervals: normal ST/T Wave abnormalities: normal Conduction Disturbances: none Narrative Interpretation: unremarkable  ____________________________________________  RADIOLOGY   Dg Chest 2 View  01/06/2016  CLINICAL DATA:  Mid to left-sided chest pain radiating to the back for 12 hours. EXAM: CHEST  2 VIEW COMPARISON:  12/02/2015 FINDINGS: The patient is post median sternotomy and CABG. Unchanged hyperinflation and chronic bronchitic change.The cardiomediastinal contours are normal. Pulmonary vasculature is normal. No consolidation, pleural effusion, or pneumothorax. No acute osseous abnormalities are seen. IMPRESSION: Chronic hyperinflation and bronchitic change. No superimposed acute process. Electronically Signed   By: Jeb Levering M.D.   On: 01/06/2016 21:08    ____________________________________________   PROCEDURES  Procedure(s) performed: None  Critical Care performed: No ____________________________________________   INITIAL IMPRESSION / ASSESSMENT AND PLAN / ED COURSE  Pertinent labs & imaging results that were available during my care of the patient were reviewed by me and considered in my medical decision making (see chart for details).  Troponins unremarkable x 2.  NAD, at baseline, no evidence of CHF exacerbation.  Based on auscultation, suspect mild COPD exacerbation.  Feels better after Duoneb.  Will treat with prednisone and albuterol, recommended close outpatient f/u w/ his regular doctor.  Patient agrees with plan.    I gave my usual and customary return precautions.      ____________________________________________  FINAL CLINICAL IMPRESSION(S) / ED DIAGNOSES  Final diagnoses:  Chronic obstructive pulmonary disease with acute exacerbation (HCC)      NEW MEDICATIONS STARTED DURING THIS VISIT:  New Prescriptions   ALBUTEROL (PROVENTIL HFA;VENTOLIN HFA) 108 (90 BASE) MCG/ACT INHALER    Inhale 4-6 puffs by mouth every 4 hours as needed for wheezing, cough, and/or shortness of breath   PREDNISONE (DELTASONE) 20 MG TABLET    Take 3 tablets (60 mg total) by mouth daily.     Hinda Kehr, MD 01/07/16 [phone]

## 2016-01-07 NOTE — Discharge Instructions (Signed)
We believe that your symptoms are caused today by an exacerbation of your COPD, and possibly bronchitis.  Please take the prescribed medications and any medications that you have at home for your COPD.  Follow up with your doctor as recommended.  If you develop any new or worsening symptoms, including but not limited to fever, persistent vomiting, worsening shortness of breath, or other symptoms that concern you, please return to the Emergency Department immediately.   Chronic Obstructive Pulmonary Disease Chronic obstructive pulmonary disease (COPD) is a common lung condition in which airflow from the lungs is limited. COPD is a general term that can be used to describe many different lung problems that limit airflow, including both chronic bronchitis and emphysema. If you have COPD, your lung function will probably never return to normal, but there are measures you can take to improve lung function and make yourself feel better.  CAUSES   Smoking (common).   Exposure to secondhand smoke.   Genetic problems.  Chronic inflammatory lung diseases or recurrent infections. SYMPTOMS   Shortness of breath, especially with physical activity.   Deep, persistent (chronic) cough with a large amount of thick mucus.   Wheezing.   Rapid breaths (tachypnea).   Gray or bluish discoloration (cyanosis) of the skin, especially in fingers, toes, or lips.   Fatigue.   Weight loss.   Frequent infections or episodes when breathing symptoms become much worse (exacerbations).   Chest tightness. DIAGNOSIS  Your health care provider will take a medical history and perform a physical examination to make the initial diagnosis. Additional tests for COPD may include:   Lung (pulmonary) function tests.  Chest X-ray.  CT scan.  Blood tests. TREATMENT  Treatment available to help you feel better when you have COPD includes:   Inhaler and nebulizer medicines. These help manage the symptoms of  COPD and make your breathing more comfortable.  Supplemental oxygen. Supplemental oxygen is only helpful if you have a low oxygen level in your blood.   Exercise and physical activity. These are beneficial for nearly all people with COPD. Some people may also benefit from a pulmonary rehabilitation program. HOME CARE INSTRUCTIONS   Take all medicines (inhaled or pills) as directed by your health care provider.  Avoid over-the-counter medicines or cough syrups that dry up your airway (such as antihistamines) and slow down the elimination of secretions unless instructed otherwise by your health care provider.   If you are a smoker, the most important thing that you can do is stop smoking. Continuing to smoke will cause further lung damage and breathing trouble. Ask your health care provider for help with quitting smoking. He or she can direct you to community resources or hospitals that provide support.  Avoid exposure to irritants such as smoke, chemicals, and fumes that aggravate your breathing.  Use oxygen therapy and pulmonary rehabilitation if directed by your health care provider. If you require home oxygen therapy, ask your health care provider whether you should purchase a pulse oximeter to measure your oxygen level at home.   Avoid contact with individuals who have a contagious illness.  Avoid extreme temperature and humidity changes.  Eat healthy foods. Eating smaller, more frequent meals and resting before meals may help you maintain your strength.  Stay active, but balance activity with periods of rest. Exercise and physical activity will help you maintain your ability to do things you want to do.  Preventing infection and hospitalization is very important when you have COPD. Make sure  to receive all the vaccines your health care provider recommends, especially the pneumococcal and influenza vaccines. Ask your health care provider whether you need a pneumonia vaccine.  Learn  and use relaxation techniques to manage stress.  Learn and use controlled breathing techniques as directed by your health care provider. Controlled breathing techniques include:   Pursed lip breathing. Start by breathing in (inhaling) through your nose for 1 second. Then, purse your lips as if you were going to whistle and breathe out (exhale) through the pursed lips for 2 seconds.   Diaphragmatic breathing. Start by putting one hand on your abdomen just above your waist. Inhale slowly through your nose. The hand on your abdomen should move out. Then purse your lips and exhale slowly. You should be able to feel the hand on your abdomen moving in as you exhale.   Learn and use controlled coughing to clear mucus from your lungs. Controlled coughing is a series of short, progressive coughs. The steps of controlled coughing are:  1. Lean your head slightly forward.  2. Breathe in deeply using diaphragmatic breathing.  3. Try to hold your breath for 3 seconds.  4. Keep your mouth slightly open while coughing twice.  5. Spit any mucus out into a tissue.  6. Rest and repeat the steps once or twice as needed. SEEK MEDICAL CARE IF:   You are coughing up more mucus than usual.   There is a change in the color or thickness of your mucus.   Your breathing is more labored than usual.   Your breathing is faster than usual.  SEEK IMMEDIATE MEDICAL CARE IF:   You have shortness of breath while you are resting.   You have shortness of breath that prevents you from:  Being able to talk.   Performing your usual physical activities.   You have chest pain lasting longer than 5 minutes.   Your skin color is more cyanotic than usual.  You measure low oxygen saturations for longer than 5 minutes with a pulse oximeter. MAKE SURE YOU:   Understand these instructions.  Will watch your condition.  Will get help right away if you are not doing well or get worse. Document Released:  08/12/2005 Document Revised: 03/19/2014 Document Reviewed: 06/29/2013 [employer] Patient Information 2015 [place], [place]. This information is not intended to replace advice given to you by your health care provider. Make sure you discuss any questions you have with your health care provider.

## 2016-01-07 NOTE — Telephone Encounter (Signed)
Patient is calling back wanting to know about his oxygen order.  He said [facility] says he needs it 24/7.  Please advise.  [phone]  Hoyt Koch

## 2016-01-08 ENCOUNTER — Other Ambulatory Visit: Payer: Self-pay | Admitting: Family Medicine

## 2016-01-08 ENCOUNTER — Other Ambulatory Visit: Payer: Self-pay | Admitting: *Deleted

## 2016-01-08 MED ORDER — CYCLOBENZAPRINE HCL 10 MG PO TABS
10.0000 mg | ORAL_TABLET | Freq: Three times a day (TID) | ORAL | Status: DC | PRN
Start: 2016-01-08 — End: 2016-07-25

## 2016-01-08 MED ORDER — CYCLOBENZAPRINE HCL 10 MG PO TABS: 10.0000 mg | ORAL_TABLET | Freq: Three times a day (TID) | ORAL | Status: DC | PRN

## 2016-01-08 NOTE — Telephone Encounter (Signed)
Patient was notified that orders have been faxed to [facility].

## 2016-01-08 NOTE — Telephone Encounter (Signed)
Order was faxed to [facility] for portable oxygen when ambulating 01/02/16

## 2016-01-10 ENCOUNTER — Encounter: Payer: Self-pay | Admitting: Emergency Medicine

## 2016-01-10 ENCOUNTER — Emergency Department: Payer: Medicare PPO

## 2016-01-10 ENCOUNTER — Emergency Department
Admission: EM | Admit: 2016-01-10 | Discharge: 2016-01-10 | Disposition: A | Discharge status: Home / Self Care | Payer: Medicare PPO | Attending: Emergency Medicine | Admitting: Emergency Medicine

## 2016-01-10 DIAGNOSIS — Z87891 Personal history of nicotine dependence: Secondary | ICD-10-CM | POA: Insufficient documentation

## 2016-01-10 DIAGNOSIS — E119 Type 2 diabetes mellitus without complications: Secondary | ICD-10-CM | POA: Insufficient documentation

## 2016-01-10 DIAGNOSIS — J439 Emphysema, unspecified: Secondary | ICD-10-CM | POA: Diagnosis not present

## 2016-01-10 DIAGNOSIS — Z7902 Long term (current) use of antithrombotics/antiplatelets: Secondary | ICD-10-CM | POA: Diagnosis not present

## 2016-01-10 DIAGNOSIS — Z791 Long term (current) use of non-steroidal anti-inflammatories (NSAID): Secondary | ICD-10-CM | POA: Diagnosis not present

## 2016-01-10 DIAGNOSIS — R1010 Upper abdominal pain, unspecified: Secondary | ICD-10-CM

## 2016-01-10 DIAGNOSIS — Z7951 Long term (current) use of inhaled steroids: Secondary | ICD-10-CM | POA: Insufficient documentation

## 2016-01-10 DIAGNOSIS — Z7952 Long term (current) use of systemic steroids: Secondary | ICD-10-CM | POA: Diagnosis not present

## 2016-01-10 DIAGNOSIS — R0600 Dyspnea, unspecified: Secondary | ICD-10-CM

## 2016-01-10 DIAGNOSIS — I10 Essential (primary) hypertension: Secondary | ICD-10-CM | POA: Insufficient documentation

## 2016-01-10 DIAGNOSIS — R1012 Left upper quadrant pain: Secondary | ICD-10-CM | POA: Diagnosis not present

## 2016-01-10 DIAGNOSIS — R110 Nausea: Secondary | ICD-10-CM | POA: Insufficient documentation

## 2016-01-10 DIAGNOSIS — Z79899 Other long term (current) drug therapy: Secondary | ICD-10-CM | POA: Diagnosis not present

## 2016-01-10 DIAGNOSIS — R1013 Epigastric pain: Secondary | ICD-10-CM | POA: Diagnosis not present

## 2016-01-10 DIAGNOSIS — Z7984 Long term (current) use of oral hypoglycemic drugs: Secondary | ICD-10-CM | POA: Diagnosis not present

## 2016-01-10 LAB — TROPONIN I: Troponin I: <0.03 ng/mL (ref <0.031)

## 2016-01-10 LAB — COMPREHENSIVE METABOLIC PANEL
ALT: 18 U/L (ref 17–63)
AST: 19 U/L (ref 15–41)
Albumin: 3.8 g/dL (ref 3.5–5.0)
Alkaline Phosphatase: 78 U/L (ref 38–126)
Anion gap: 6 (ref 5–15)
BUN: 14 mg/dL (ref 6–20)
CO2: 31 mmol/L (ref 22–32)
Calcium: 9 mg/dL (ref 8.9–10.3)
Chloride: 98 mmol/L — ABNORMAL LOW (ref 101–111)
Creatinine, Ser: 1.22 mg/dL (ref 0.61–1.24)
GFR calc Af Amer: >60 mL/min (ref >60)
GFR calc non Af Amer: >60 mL/min (ref >60)
Glucose, Bld: 146 mg/dL — ABNORMAL HIGH (ref 65–99)
Potassium: 3.8 mmol/L (ref 3.5–5.1)
Sodium: 135 mmol/L (ref 135–145)
Total Bilirubin: 0.9 mg/dL (ref 0.3–1.2)
Total Protein: 7 g/dL (ref 6.5–8.1)

## 2016-01-10 LAB — CBC
HCT: 39.9 % — ABNORMAL LOW (ref 40.0–52.0)
Hemoglobin: 12.7 g/dL — ABNORMAL LOW (ref 13.0–18.0)
MCH: 25.3 pg — ABNORMAL LOW (ref 26.0–34.0)
MCHC: 31.9 g/dL — ABNORMAL LOW (ref 32.0–36.0)
MCV: 79.5 fL — ABNORMAL LOW (ref 80.0–100.0)
Platelets: 229 10*3/uL (ref 150–440)
RBC: 5.02 MIL/uL (ref 4.40–5.90)
RDW: 15.8 % — ABNORMAL HIGH (ref 11.5–14.5)
WBC: 6.9 10*3/uL (ref 3.8–10.6)

## 2016-01-10 LAB — LIPASE, BLOOD: Lipase: 25 U/L (ref 11–51)

## 2016-01-10 MED ORDER — IOHEXOL 240 MG/ML SOLN
25.0000 mL | Freq: Once | INTRAMUSCULAR | Status: AC | PRN
  Administered 2016-01-10: 25 mL via ORAL

## 2016-01-10 MED ORDER — HYDROMORPHONE HCL 1 MG/ML IJ SOLN
1.0000 mg | Freq: Once | INTRAMUSCULAR | Status: AC
  Administered 2016-01-10: 1 mg via INTRAVENOUS
  Filled 2016-01-10: qty 1

## 2016-01-10 MED ORDER — SODIUM CHLORIDE 0.9 % IV BOLUS (SEPSIS)
1000.0000 mL | Freq: Once | INTRAVENOUS | Status: AC
  Administered 2016-01-10: 1000 mL via INTRAVENOUS

## 2016-01-10 MED ORDER — ONDANSETRON HCL 4 MG/2ML IJ SOLN
4.0000 mg | Freq: Once | INTRAMUSCULAR | Status: AC
  Administered 2016-01-10: 4 mg via INTRAVENOUS
  Filled 2016-01-10: qty 2

## 2016-01-10 MED ORDER — IOHEXOL 300 MG/ML  SOLN
100.0000 mL | Freq: Once | INTRAMUSCULAR | Status: AC | PRN
  Administered 2016-01-10: 100 mL via INTRAVENOUS

## 2016-01-10 NOTE — Discharge Instructions (Signed)
Abdominal Pain, Adult Many things can cause abdominal pain. Usually, abdominal pain is not caused by a disease and will improve without treatment. It can often be observed and treated at home. Your health care provider will do a physical exam and possibly order blood tests and X-rays to help determine the seriousness of your pain. However, in many cases, more time must pass before a clear cause of the pain can be found. Before that point, your health care provider may not know if you need more testing or further treatment. HOME CARE INSTRUCTIONS Monitor your abdominal pain for any changes. The following actions may help to alleviate any discomfort you are experiencing:  Only take over-the-counter or prescription medicines as directed by your health care provider.  Do not take laxatives unless directed to do so by your health care provider.  Try a clear liquid diet (broth, tea, or water) as directed by your health care provider. Slowly move to a bland diet as tolerated. SEEK MEDICAL CARE IF:  You have unexplained abdominal pain.  You have abdominal pain associated with nausea or diarrhea.  You have pain when you urinate or have a bowel movement.  You experience abdominal pain that wakes you in the night.  You have abdominal pain that is worsened or improved by eating food.  You have abdominal pain that is worsened with eating fatty foods.  You have a fever. SEEK IMMEDIATE MEDICAL CARE IF:  Your pain does not go away within 2 hours.  You keep throwing up (vomiting).  Your pain is felt only in portions of the abdomen, such as the right side or the left lower portion of the abdomen.  You pass bloody or black tarry stools. MAKE SURE YOU:  Understand these instructions.  Will watch your condition.  Will get help right away if you are not doing well or get worse.   This information is not intended to replace advice given to you by your health care provider. Make sure you discuss  any questions you have with your health care provider.   Document Released: 08/12/2005 Document Revised: 07/24/2015 Document Reviewed: 07/12/2013 Elsevier Interactive Patient Education 2016 [place] of Breath Shortness of breath means you have trouble breathing. Shortness of breath needs medical care right away. HOME CARE   Do not smoke.  Avoid being around chemicals or things (paint fumes, dust) that may bother your breathing.  Rest as needed. Slowly begin your normal activities.  Only take medicines as told by your doctor.  Keep all doctor visits as told. GET HELP RIGHT AWAY IF:   Your shortness of breath gets worse.  You feel lightheaded, pass out (faint), or have a cough that is not helped by medicine.  You cough up blood.  You have pain with breathing.  You have pain in your chest, arms, shoulders, or belly (abdomen).  You have a fever.  You cannot walk up stairs or exercise the way you normally do.  You do not get better in the time expected.  You have a hard time doing normal activities even with rest.  You have problems with your medicines.  You have any new symptoms. MAKE SURE YOU:  Understand these instructions.  Will watch your condition.  Will get help right away if you are not doing well or get worse.   This information is not intended to replace advice given to you by your health care provider. Make sure you discuss any questions you have with your health  care provider.   Document Released: 04/20/2008 Document Revised: 11/07/2013 Document Reviewed: 01/18/2012 Elsevier Interactive Patient Education [employer].

## 2016-01-10 NOTE — ED Notes (Signed)
Pt complains of shortness of breath that started yesterday and upper abdominal pain. Pt complains of nausea but denies vomiting. Pt states he saw Cardiologist on Tuesday and has been taking an extra fluid pill for lower extremity swelling.

## 2016-01-10 NOTE — ED Provider Notes (Signed)
[facility] Emergency Department Provider Note  Time seen: 3:12 PM  I have reviewed the triage vital signs and the nursing notes.   HISTORY  Chief Complaint Shortness of Breath and Abdominal Pain    HPI Lawrence [name] is a 60 y.o. male with a past medical history of hypertension, COPD, diabetes, asthma, CHF, anxiety, paroxysmal atrial fibrillation, presents the emergency department for shortness of breath and upper abdominal pain. According to the patient began experiencing upper abdominal pain yesterday morning which has progressed and is now severe 9/10 per patient. Patient states a history of pancreatitis in the past which this feels very similar. Denies alcohol use. Patient had his gallbladder out greater than 10 years ago. Patient also states shortness of breath although states a history of COPD and states his shortness of breath is mildly increased over his baseline. Denies any chest pain today. Denies any diaphoresis. Patient describes his abdominal pain as dull aching located in the upper to left upper quadrant of the abdomen associated with nausea, denies vomiting, denies diarrhea, denies fever.     Past Medical History  Diagnosis Date  . CAD (coronary artery disease)     a. 2002 CABGx2 (LIMA->LAD, VG->VG->OM1);  b. 09/2012 DES->OM;  c. 03/2015 PTCA of LAD [facility]) in setting of atretic LIMA; d. 05/2015 Cath [facility]): nonobs dzs; e. 06/2015 Cath ([facility]): LM nl, LAD 45p/d ISR, 50d, D1/2 small, LCX 50p/d ISR, OM1 70ost, 30 ISR, VG->OM1 50ost, 75m LIMA->LAD 99p/d - atretic, RCA dom, nl; f.cath 10/16: 40-50%(FFR 0.90) pLAD, 75% (FFR 0.77) mLAD s/p PCI/DES, oRCA 40% (FFR0.95)  . Essential hypertension   . COPD (chronic obstructive pulmonary disease) ([place]     a. Chronic bronchitis and emphysema.  . Type II diabetes mellitus (HWoodlawn [place]   . Asthma   . Celiac disease   . Chronic diastolic CHF (congestive heart failure) (HL[place]     a. 06/2009 Echo: EF 60-65%, Gr 1 DD, triv AI,  mildly dil LA, nl RV.  .Marland KitchenAnxiety   . History of tobacco abuse     a. Quit 2014.  .Marland KitchenPSVT (paroxysmal supraventricular tachycardia) (H[place]     a. 10/2012 Noted on Zio Patch.  . Dysrhythmia   . A-fib ([facility]     Patient Active Problem List   Diagnosis Date Noted  . Paroxysmal atrial fibrillation (H[place] 12/23/2015  . OSA (obstructive sleep apnea) 12/10/2015  . Left inguinal hernia 11/07/2015  . Acute anxiety 11/07/2015  . Unstable angina (HSt[place] 10/05/2015  . PAF (paroxysmal atrial fibrillation) (HA[place]erman[place] 10/05/2015  . Back pain with left-sided radiculopathy 09/30/2015  . Nocturnal hypoxia 09/06/2015  . BPH (benign prostatic hyperplasia) 08/01/2015  . Chronic diastolic CHF (congestive heart failure) (HB[place]   . [facility] ([facility]   . Chest pain 07/11/2015  . COPD (chronic obstructive pulmonary disease) (HN[place] 07/03/2015  . CAD (coronary artery disease) 06/26/2015  . HTN (hypertension) 06/26/2015  . DM (diabetes mellitus) ([place] 06/26/2015  . Achalasia 07/24/2014  . GERD (gastroesophageal reflux disease) 06/07/2014  . Former tobacco use 04/11/2013  . Chest pain at rest 04/09/2013  . HLD (hyperlipidemia) 04/09/2013    Past Surgical History  Procedure Laterality Date  . Vascular surgery    . Bypass graft    . Tonsillectomy    . Cholecystectomy    . Cardiac catheterization N/A 07/12/2015    rocedure: Left Heart Cath and Cors/Grafts Angiography;  Surgeon: HBelva Crome MD;  [place] [facility];  Service: Cardiovascular;  Laterality:  N/A;  . Esophageal dilation    . Cardiac catheterization Right 10/07/2015    Procedure: Left Heart Cath and Cors/Grafts Angiography;  Surgeon: Dionisio David, MD;  Location: [place] [facility];  Service: Cardiovascular;  Laterality: Right;    Current Outpatient Rx  Name  Route  Sig  Dispense  Refill  . acetaminophen (TYLENOL) 325 MG tablet   Oral   Take 2 tablets (650 mg total) by mouth every 6 (six) hours as needed for mild pain or headache.          . albuterol (PROVENTIL HFA;VENTOLIN HFA) 108 (90 Base) MCG/ACT inhaler      Inhale 4-6 puffs by mouth every 4 hours as needed for wheezing, cough, and/or shortness of breath   1 Inhaler   1   . ALPRAZolam (XANAX) 1 MG tablet   Oral   Take 0.5-1 tablets (0.5-1 mg total) by mouth 3 (three) times daily as needed for anxiety.   90 tablet   3   . apixaban (ELIQUIS) 5 MG TABS tablet   Oral   Take 5 mg by mouth 2 (two) times daily.         Marland Kitchen atorvastatin (LIPITOR) 80 MG tablet   Oral   Take 1 tablet (80 mg total) by mouth at bedtime.   90 tablet   3   . cetirizine (ZYRTEC) 10 MG tablet   Oral   Take 10 mg by mouth at bedtime.         . citalopram (CELEXA) 20 MG tablet   Oral   Take 20 mg by mouth daily.         . clopidogrel (PLAVIX) 75 MG tablet   Oral   Take 1 tablet (75 mg total) by mouth daily.   30 tablet   5   . cyclobenzaprine (FLEXERIL) 10 MG tablet   Oral   Take 1 tablet (10 mg total) by mouth every 8 (eight) hours as needed for muscle spasms.   30 tablet   5   . isosorbide mononitrate (IMDUR) 60 MG 24 hr tablet   Oral   Take 120 mg by mouth daily.          . lansoprazole (PREVACID) 30 MG capsule   Oral   Take 30 mg by mouth daily at 12 noon.         . magnesium oxide (MAG-OX) 400 MG tablet   Oral   Take 400 mg by mouth.         . metFORMIN (GLUCOPHAGE) 500 MG tablet   Oral   Take 500 mg by mouth daily.         . metoCLOPramide (REGLAN) 10 MG tablet   Oral   Take 1 tablet (10 mg total) by mouth every 6 (six) hours as needed for nausea.   15 tablet   0   . mometasone-formoterol (DULERA) 200-5 MCG/ACT AERO   Inhalation   Inhale 1 puff into the lungs 2 (two) times daily.         . naproxen (NAPROSYN) 500 MG tablet   Oral   Take 1 tablet (500 mg total) by mouth 2 (two) times daily with a meal.   60 tablet   0   . nitroGLYCERIN (NITROSTAT) 0.4 MG SL tablet   Sublingual   Place 0.4 mg under the tongue every 5 (five)  minutes as needed for chest pain.         Marland Kitchen omega-3 acid ethyl esters (LOVAZA) 1  g capsule   Oral   Take 4 g by mouth daily.         Marland Kitchen oxyCODONE-acetaminophen (PERCOCET) 10-325 MG tablet   Oral   Take 1 tablet by mouth every 4 (four) hours as needed for pain.   150 tablet   0   . potassium chloride (K-DUR,KLOR-CON) 10 MEQ tablet   Oral   Take 10 mEq by mouth daily.         . predniSONE (DELTASONE) 20 MG tablet   Oral   Take 3 tablets (60 mg total) by mouth daily.   15 tablet   0   . ranolazine (RANEXA) 1000 MG SR tablet   Oral   Take 1 tablet (1,000 mg total) by mouth 2 (two) times daily.   60 tablet   2   . sotalol (BETAPACE) 120 MG tablet   Oral   Take 120 mg by mouth.         . tamsulosin (FLOMAX) 0.4 MG CAPS capsule   Oral   Take 0.4 mg by mouth at bedtime.          Marland Kitchen tiotropium (SPIRIVA) 18 MCG inhalation capsule   Inhalation   Place 18 mcg into inhaler and inhale daily.           Allergies Demerol; Prednisone; Sulfa antibiotics; Albuterol sulfate; and Morphine sulfate  Family History  Problem Relation Age of Onset  . Heart attack Mother   . Cirrhosis Mother   . Diabetes Mother   . Depression Mother   . Heart disease Mother   . Heart attack Father   . Diabetes Father   . Depression Father   . Heart disease Father   . Parkinson's disease Brother     Social History Social History  Substance Use Topics  . Smoking status: Former Smoker    Quit date: 04/22/2013  . Smokeless tobacco: Never Used  . Alcohol Use: No     Comment: remotely quit alcohol use. Hx of heavy alcohol use.    Review of Systems Constitutional: Negative for fever Cardiovascular: Negative for chest pain. Respiratory: Positive for shortness of breath since yesterday. Gastrointestinal: Positive for upper abdominal pain. Positive for nausea. Negative for vomiting or diarrhea.. Genitourinary: Negative for dysuria. Musculoskeletal: Negative for back pain Neurological:  Negative for headache 10-point ROS otherwise negative.  ____________________________________________   PHYSICAL EXAM:  VITAL SIGNS: ED Triage Vitals  Enc Vitals Group     BP 01/10/16 1151 114/70 mmHg     Pulse Rate 01/10/16 1151 72     Resp 01/10/16 1151 20     Temp 01/10/16 1151 98.1 F (36.7 C)     Temp Source 01/10/16 1151 Oral     SpO2 01/10/16 1151 95 %     Weight 01/10/16 1151 234 lb (106.142 kg)     Height 01/10/16 1151 5' 7"  (1.702 m)     Head Cir --      Peak Flow --      Pain Score 01/10/16 1149 9     Pain Loc --      Pain Edu? --      Excl. in [place]? --     Constitutional: Alert and oriented. Well appearing and in no distress. Eyes: Normal exam ENT   Head: Normocephalic and atraumatic.   Mouth/Throat: Mucous membranes are moist. Cardiovascular: Normal rate, regular rhythm. No murmur Respiratory: Normal respiratory effort without tachypnea nor retractions. Breath sounds are clear Gastrointestinal: Soft, moderate epigastric and left upper  quadrant tenderness to palpation. Mild diffuse tenderness. No rebound or guarding. No distention. Musculoskeletal: Nontender with normal range of motion in all extremities.  Neurologic:  Normal speech and language. No gross focal neurologic deficits  Skin:  Skin is warm, dry and intact.  Psychiatric: Mood and affect are normal. Speech and behavior are normal.   ____________________________________________    EKG  EKG reviewed and interpreted by myself shows normal sinus rhythm at 72 bpm, narrow QRS, normal axis, normal intervals, normal ST segments. Overall normal EKG.  ____________________________________________    RADIOLOGY  CT negative  Chest x-ray shows no acute abnormality  ____________________________________________   INITIAL IMPRESSION / ASSESSMENT AND PLAN / ED COURSE  Pertinent labs & imaging results that were available during my care of the patient were reviewed by me and considered in my medical  decision making (see chart for details).  Patient presents the emergency department with shortness of breath and upper abdominal pain beginning yesterday morning. Patient doesn't moderate epigastric to left upper quadrant tenderness to palpation. Patient's labs are unremarkable. I will add on a troponin, EKG appears well, chest x-ray is normal. We will also obtain a CT scan of the abdomen/pelvis to rule out intra-abdominal pathology. Patient's lipase is within normal limits.   CT shows no acute abnormalities. Patient's troponin is negative. I discussed the results with the patient, plan follow up with his primary care physician on Monday, patient agreeable. I discussed return precautions to which the patient is agreeable as well.  ____________________________________________   FINAL CLINICAL IMPRESSION(S) / ED DIAGNOSES  Upper abdominal pain Dyspnea   Harvest Dark, MD 01/10/16 [phone]

## 2016-01-21 ENCOUNTER — Emergency Department
Admission: EM | Admit: 2016-01-21 | Discharge: 2016-01-22 | Disposition: A | Discharge status: Home / Self Care | Payer: Commercial Managed Care - HMO | Attending: Emergency Medicine | Admitting: Emergency Medicine

## 2016-01-21 ENCOUNTER — Encounter: Payer: Self-pay | Admitting: *Deleted

## 2016-01-21 ENCOUNTER — Other Ambulatory Visit: Payer: Self-pay | Admitting: Family Medicine

## 2016-01-21 DIAGNOSIS — Z79899 Other long term (current) drug therapy: Secondary | ICD-10-CM | POA: Diagnosis not present

## 2016-01-21 DIAGNOSIS — I5032 Chronic diastolic (congestive) heart failure: Secondary | ICD-10-CM | POA: Diagnosis not present

## 2016-01-21 DIAGNOSIS — I2510 Atherosclerotic heart disease of native coronary artery without angina pectoris: Secondary | ICD-10-CM | POA: Diagnosis not present

## 2016-01-21 DIAGNOSIS — Z7952 Long term (current) use of systemic steroids: Secondary | ICD-10-CM | POA: Insufficient documentation

## 2016-01-21 DIAGNOSIS — R1013 Epigastric pain: Secondary | ICD-10-CM | POA: Diagnosis not present

## 2016-01-21 DIAGNOSIS — Z87891 Personal history of nicotine dependence: Secondary | ICD-10-CM | POA: Diagnosis not present

## 2016-01-21 DIAGNOSIS — Z791 Long term (current) use of non-steroidal anti-inflammatories (NSAID): Secondary | ICD-10-CM | POA: Diagnosis not present

## 2016-01-21 DIAGNOSIS — Z7901 Long term (current) use of anticoagulants: Secondary | ICD-10-CM | POA: Diagnosis not present

## 2016-01-21 DIAGNOSIS — Z7902 Long term (current) use of antithrombotics/antiplatelets: Secondary | ICD-10-CM | POA: Insufficient documentation

## 2016-01-21 DIAGNOSIS — M5410 Radiculopathy, site unspecified: Secondary | ICD-10-CM

## 2016-01-21 DIAGNOSIS — E119 Type 2 diabetes mellitus without complications: Secondary | ICD-10-CM | POA: Diagnosis not present

## 2016-01-21 DIAGNOSIS — Z7984 Long term (current) use of oral hypoglycemic drugs: Secondary | ICD-10-CM | POA: Diagnosis not present

## 2016-01-21 DIAGNOSIS — Z951 Presence of aortocoronary bypass graft: Secondary | ICD-10-CM | POA: Insufficient documentation

## 2016-01-21 DIAGNOSIS — Z7951 Long term (current) use of inhaled steroids: Secondary | ICD-10-CM | POA: Diagnosis not present

## 2016-01-21 DIAGNOSIS — R079 Chest pain, unspecified: Secondary | ICD-10-CM | POA: Diagnosis not present

## 2016-01-21 LAB — BASIC METABOLIC PANEL
Anion gap: 6 (ref 5–15)
BUN: 16 mg/dL (ref 6–20)
CO2: 33 mmol/L — ABNORMAL HIGH (ref 22–32)
Calcium: 9.2 mg/dL (ref 8.9–10.3)
Chloride: 101 mmol/L (ref 101–111)
Creatinine, Ser: 0.99 mg/dL (ref 0.61–1.24)
GFR calc Af Amer: >60 mL/min (ref >60)
GFR calc non Af Amer: >60 mL/min (ref >60)
Glucose, Bld: 154 mg/dL — ABNORMAL HIGH (ref 65–99)
Potassium: 3.9 mmol/L (ref 3.5–5.1)
Sodium: 140 mmol/L (ref 135–145)

## 2016-01-21 LAB — HEPATIC FUNCTION PANEL
ALT: 15 U/L — ABNORMAL LOW (ref 17–63)
AST: 19 U/L (ref 15–41)
Albumin: 3.7 g/dL (ref 3.5–5.0)
Alkaline Phosphatase: 74 U/L (ref 38–126)
Bilirubin, Direct: <0.1 mg/dL — ABNORMAL LOW (ref 0.1–0.5)
Total Bilirubin: 0.3 mg/dL (ref 0.3–1.2)
Total Protein: 6.7 g/dL (ref 6.5–8.1)

## 2016-01-21 LAB — CBC
HCT: 36.6 % — ABNORMAL LOW (ref 40.0–52.0)
Hemoglobin: 12.1 g/dL — ABNORMAL LOW (ref 13.0–18.0)
MCH: 25.6 pg — ABNORMAL LOW (ref 26.0–34.0)
MCHC: 33.2 g/dL (ref 32.0–36.0)
MCV: 77.3 fL — ABNORMAL LOW (ref 80.0–100.0)
Platelets: 263 10*3/uL (ref 150–440)
RBC: 4.73 MIL/uL (ref 4.40–5.90)
RDW: 16.3 % — ABNORMAL HIGH (ref 11.5–14.5)
WBC: 7.5 10*3/uL (ref 3.8–10.6)

## 2016-01-21 LAB — LIPASE, BLOOD: Lipase: 23 U/L (ref 11–51)

## 2016-01-21 LAB — TROPONIN I: Troponin I: <0.03 ng/mL (ref <0.031)

## 2016-01-21 MED ORDER — OXYCODONE-ACETAMINOPHEN 10-325 MG PO TABS: 1.0000 | ORAL_TABLET | ORAL | Status: DC | PRN

## 2016-01-21 MED ORDER — GI COCKTAIL ~~LOC~~
30.0000 mL | Freq: Once | ORAL | Status: AC
  Administered 2016-01-21: 30 mL via ORAL
  Filled 2016-01-21: qty 30

## 2016-01-21 NOTE — ED Provider Notes (Signed)
[facility] Emergency Department Provider Note    ____________________________________________  Time seen: ~2135  I have reviewed the triage vital signs and the nursing notes.   HISTORY  Chief Complaint Chest Pain   History limited by: Not Limited   HPI Lawrence [name] is a 60 y.o. male who presents to the emergency department today because of concerns for chest pain. He states it started roughly 1 hour ago. He states he was sitting down at that time. He describes it as being sharp. It is located in the left chest. It is also located in the epigastrium. Some radiation up into his left shoulder. He did not have any associated shortness breath or diaphoresis. He states he's been getting this pain on and off for quite a while. He states will come and go frequently. He denies any recent fevers. He states he did try taking his nitroglycerin without any modification of the symptoms.    Past Medical History  Diagnosis Date  . CAD (coronary artery disease)     a. 2002 CABGx2 (LIMA->LAD, VG->VG->OM1);  b. 09/2012 DES->OM;  c. 03/2015 PTCA of LAD [facility]) in setting of atretic LIMA; d. 05/2015 Cath [facility]): nonobs dzs; e. 06/2015 Cath ([facility]): LM nl, LAD 45p/d ISR, 50d, D1/2 small, LCX 50p/d ISR, OM1 70ost, 30 ISR, VG->OM1 50ost, 73m LIMA->LAD 99p/d - atretic, RCA dom, nl; f.cath 10/16: 40-50%(FFR 0.90) pLAD, 75% (FFR 0.77) mLAD s/p PCI/DES, oRCA 40% (FFR0.95)  . Essential hypertension   . COPD (chronic obstructive pulmonary disease) ([place]     a. Chronic bronchitis and emphysema.  . Type II diabetes mellitus (HF[place]   . Asthma   . Celiac disease   . Chronic diastolic CHF (congestive heart failure) (HCl[place]     a. 06/2009 Echo: EF 60-65%, Gr 1 DD, triv AI, mildly dil LA, nl RV.  .Marland KitchenAnxiety   . History of tobacco abuse     a. Quit 2014.  .Marland KitchenPSVT (paroxysmal supraventricular tachycardia) (HDale     a. 10/2012 Noted on Zio Patch.  . Dysrhythmia   . [facility]     Patient Active  Problem List   Diagnosis Date Noted  . Paroxysmal atrial fibrillation (HE[place] 12/23/2015  . OSA (obstructive sleep apnea) 12/10/2015  . Left inguinal hernia 11/07/2015  . Acute anxiety 11/07/2015  . Unstable angina (HH[place] 10/05/2015  . PAF (paroxysmal atrial fibrillation) (HL[place] 10/05/2015  . Back pain with left-sided radiculopathy 09/30/2015  . Nocturnal hypoxia 09/06/2015  . BPH (benign prostatic hyperplasia) 08/01/2015  . Chronic diastolic CHF (congestive heart failure) (HP[place]   . Angina at rest ([facility]   . Chest pain 07/11/2015  . COPD (chronic obstructive pulmonary disease) (HN[place] 07/03/2015  . CAD (coronary artery disease) 06/26/2015  . HTN (hypertension) 06/26/2015  . DM (diabetes mellitus) (HShe[place] 06/26/2015  . Achalasia 07/24/2014  . GERD (gastroesophageal reflux disease) 06/07/2014  . Former tobacco use 04/11/2013  . Chest pain at rest 04/09/2013  . HLD (hyperlipidemia) 04/09/2013    Past Surgical History  Procedure Laterality Date  . Vascular surgery    . Bypass graft    . Tonsillectomy    . Cholecystectomy    . Cardiac catheterization N/A 07/12/2015    rocedure: Left Heart Cath and Cors/Grafts Angiography;  Surgeon: HBelva Crome MD;  [place] [facility];  Service: Cardiovascular;  Laterality: N/A;  . Esophageal dilation    . Cardiac catheterization Right 10/07/2015    Procedure: Left Heart Cath and Cors/Grafts Angiography;  Surgeon: Dionisio David, MD;  Location: [place] CV LAB;  Service: Cardiovascular;  Laterality: Right;    Current Outpatient Rx  Name  Route  Sig  Dispense  Refill  . acetaminophen (TYLENOL) 325 MG tablet   Oral   Take 2 tablets (650 mg total) by mouth every 6 (six) hours as needed for mild pain or headache.         . albuterol (PROVENTIL HFA;VENTOLIN HFA) 108 (90 Base) MCG/ACT inhaler      Inhale 4-6 puffs by mouth every 4 hours as needed for wheezing, cough, and/or shortness of breath   1 Inhaler   1   . ALPRAZolam (XANAX) 1  MG tablet   Oral   Take 0.5-1 tablets (0.5-1 mg total) by mouth 3 (three) times daily as needed for anxiety.   90 tablet   3   . apixaban (ELIQUIS) 5 MG TABS tablet   Oral   Take 5 mg by mouth 2 (two) times daily.         Marland Kitchen atorvastatin (LIPITOR) 80 MG tablet   Oral   Take 1 tablet (80 mg total) by mouth at bedtime.   90 tablet   3   . cetirizine (ZYRTEC) 10 MG tablet   Oral   Take 10 mg by mouth at bedtime.         . citalopram (CELEXA) 20 MG tablet   Oral   Take 20 mg by mouth daily.         . clopidogrel (PLAVIX) 75 MG tablet   Oral   Take 1 tablet (75 mg total) by mouth daily.   30 tablet   5   . cyclobenzaprine (FLEXERIL) 10 MG tablet   Oral   Take 1 tablet (10 mg total) by mouth every 8 (eight) hours as needed for muscle spasms.   30 tablet   5   . isosorbide mononitrate (IMDUR) 60 MG 24 hr tablet   Oral   Take 120 mg by mouth daily.          . lansoprazole (PREVACID) 30 MG capsule   Oral   Take 30 mg by mouth daily at 12 noon.         . magnesium oxide (MAG-OX) 400 MG tablet   Oral   Take 400 mg by mouth.         . metFORMIN (GLUCOPHAGE) 500 MG tablet   Oral   Take 500 mg by mouth daily.         . metoCLOPramide (REGLAN) 10 MG tablet   Oral   Take 1 tablet (10 mg total) by mouth every 6 (six) hours as needed for nausea.   15 tablet   0   . mometasone-formoterol (DULERA) 200-5 MCG/ACT AERO   Inhalation   Inhale 1 puff into the lungs 2 (two) times daily.         . naproxen (NAPROSYN) 500 MG tablet   Oral   Take 1 tablet (500 mg total) by mouth 2 (two) times daily with a meal.   60 tablet   0   . nitroGLYCERIN (NITROSTAT) 0.4 MG SL tablet   Sublingual   Place 0.4 mg under the tongue every 5 (five) minutes as needed for chest pain.         Marland Kitchen omega-3 acid ethyl esters (LOVAZA) 1 g capsule   Oral   Take 4 g by mouth daily.         Marland Kitchen oxyCODONE-acetaminophen (PERCOCET) 10-325  MG tablet   Oral   Take 1 tablet by mouth  every 4 (four) hours as needed for pain.   150 tablet   0   . potassium chloride (K-DUR,KLOR-CON) 10 MEQ tablet   Oral   Take 10 mEq by mouth daily.         . predniSONE (DELTASONE) 20 MG tablet   Oral   Take 3 tablets (60 mg total) by mouth daily.   15 tablet   0   . ranolazine (RANEXA) 1000 MG SR tablet   Oral   Take 1 tablet (1,000 mg total) by mouth 2 (two) times daily.   60 tablet   2   . sotalol (BETAPACE) 120 MG tablet   Oral   Take 120 mg by mouth.         . tamsulosin (FLOMAX) 0.4 MG CAPS capsule   Oral   Take 0.4 mg by mouth at bedtime.          Marland Kitchen tiotropium (SPIRIVA) 18 MCG inhalation capsule   Inhalation   Place 18 mcg into inhaler and inhale daily.           Allergies Demerol; Prednisone; Sulfa antibiotics; Albuterol sulfate; and Morphine sulfate  Family History  Problem Relation Age of Onset  . Heart attack Mother   . Cirrhosis Mother   . Diabetes Mother   . Depression Mother   . Heart disease Mother   . Heart attack Father   . Diabetes Father   . Depression Father   . Heart disease Father   . Parkinson's disease Brother     Social History Social History  Substance Use Topics  . Smoking status: Former Smoker    Quit date: 04/22/2013  . Smokeless tobacco: Never Used  . Alcohol Use: No     Comment: remotely quit alcohol use. Hx of heavy alcohol use.    Review of Systems  Constitutional: Negative for fever. Cardiovascular: Positive for chest pain. Respiratory: Negative for shortness of breath. Gastrointestinal: Negative for abdominal pain, vomiting and diarrhea. Neurological: Negative for headaches, focal weakness or numbness.   10-point ROS otherwise negative.  ____________________________________________   PHYSICAL EXAM:  VITAL SIGNS: ED Triage Vitals  Enc Vitals Group     BP 01/21/16 2012 162/83 mmHg     Pulse Rate 01/21/16 2012 85     Resp 01/21/16 2012 20     Temp 01/21/16 2012 98.4 F (36.9 C)     Temp  Source 01/21/16 2012 Oral     SpO2 01/21/16 2012 95 %     Weight 01/21/16 2012 230 lb (104.327 kg)     Height 01/21/16 2012 5' 7"  (1.702 m)     Head Cir --      Peak Flow --      Pain Score 01/21/16 2014 9   Constitutional: Alert and oriented. Well appearing and in no distress. Eyes: Conjunctivae are normal. PERRL. Normal extraocular movements. ENT   Head: Normocephalic and atraumatic.   Nose: No congestion/rhinnorhea.   Mouth/Throat: Mucous membranes are moist.   Neck: No stridor. Hematological/Lymphatic/Immunilogical: No cervical lymphadenopathy. Cardiovascular: Normal rate, regular rhythm.  No murmurs, rubs, or gallops. Respiratory: Normal respiratory effort without tachypnea nor retractions. Breath sounds are clear and equal bilaterally. No wheezes/rales/rhonchi. Gastrointestinal: Soft. Non distended. Tender to palpation in the epigastric region which the patient states reproduces the pain.  Genitourinary: Deferred Musculoskeletal: Normal range of motion in all extremities. No joint effusions.  No lower extremity tenderness nor edema.  Neurologic:  Normal speech and language. No gross focal neurologic deficits are appreciated.  Skin:  Skin is warm, dry and intact. No rash noted. Psychiatric: Mood and affect are normal. Speech and behavior are normal. Patient exhibits appropriate insight and judgment.  ____________________________________________    LABS (pertinent positives/negatives)  Labs Reviewed  BASIC METABOLIC PANEL - Abnormal; Notable for the following:    CO2 33 (*)    Glucose, Bld 154 (*)    All other components within normal limits  CBC - Abnormal; Notable for the following:    Hemoglobin 12.1 (*)    HCT 36.6 (*)    MCV 77.3 (*)    MCH 25.6 (*)    RDW 16.3 (*)    All other components within normal limits  TROPONIN I     ____________________________________________   EKG  I, Nance Pear, attending physician, personally viewed and  interpreted this EKG  EKG Time: 2015 Rate: 89 Rhythm: normal sinus rhythm Axis: normal Intervals: qtc 435 QRS: narrow, q waves V1, V2 ST changes: no st elevation Impression: abnormal ekg  ____________________________________________    RADIOLOGY  None   ____________________________________________   PROCEDURES  Procedure(s) performed: None  Critical Care performed: No  ____________________________________________   INITIAL IMPRESSION / ASSESSMENT AND PLAN / ED COURSE  Pertinent labs & imaging results that were available during my care of the patient were reviewed by me and considered in my medical decision making (see chart for details).  Patient presented to the emergency department today because of concerns for an episode of chest pain. Patient has had similar chest pain multiple times in the past. Recently had an admission at [facility] last month for somewhat chest pain which had a negative cardiac workup. Furthermore patient is been evaluated in this emergency department for similar chest pain in the past as well. Initial troponin was negative. EKG without any acute findings. Will plan on checking a second troponin given patient's history however I feel it is negative I think likely this is however he present ACS. Will encourage cardiology follow-up.  ____________________________________________   FINAL CLINICAL IMPRESSION(S) / ED DIAGNOSES  Final diagnoses:  Chest pain, unspecified chest pain type     Nance Pear, MD 01/23/16 1544

## 2016-01-21 NOTE — ED Notes (Addendum)
Pt to triage via wheelchair.  Pt reports he has afib and states it's acting up.  Pt has intermittent chest pain.  Pt is on 2 liters oxygen for copd.  Sx began this morning.  No n/v/d.  Pt alert.

## 2016-01-21 NOTE — Telephone Encounter (Signed)
Pt contacted office for refill request on the following medications:  oxyCODONE-acetaminophen (PERCOCET) 10-325 MG tablet.  TH#[phone]/[id number]

## 2016-01-21 NOTE — Discharge Instructions (Signed)
Please seek medical attention for any high fevers, chest pain, shortness of breath, change in behavior, persistent vomiting, bloody stool or any other new or concerning symptoms. ° ° °Nonspecific Chest Pain °It is often hard to find the cause of chest pain. There is always a chance that your pain could be related to something serious, such as a heart attack or a blood clot in your lungs. Chest pain can also be caused by conditions that are not life-threatening. If you have chest pain, it is very important to follow up with your doctor. ° °HOME CARE °· If you were prescribed an antibiotic medicine, finish it all even if you start to feel better. °· Avoid any activities that cause chest pain. °· Do not use any tobacco products, including cigarettes, chewing tobacco, or electronic cigarettes. If you need help quitting, ask your doctor. °· Do not drink alcohol. °· Take medicines only as told by your doctor. °· Keep all follow-up visits as told by your doctor. This is important. This includes any further testing if your chest pain does not go away. °· Your doctor may tell you to keep your head raised (elevated) while you sleep. °· Make lifestyle changes as told by your doctor. These may include: °¨ Getting regular exercise. Ask your doctor to suggest some activities that are safe for you. °¨ Eating a heart-healthy diet. Your doctor or a diet specialist (dietitian) can help you to learn healthy eating options. °¨ Maintaining a healthy weight. °¨ Managing diabetes, if necessary. °¨ Reducing stress. °GET HELP IF: °· Your chest pain does not go away, even after treatment. °· You have a rash with blisters on your chest. °· You have a fever. °GET HELP RIGHT AWAY IF: °· Your chest pain is worse. °· You have an increasing cough, or you cough up blood. °· You have severe belly (abdominal) pain. °· You feel extremely weak. °· You pass out (faint). °· You have chills. °· You have sudden, unexplained chest discomfort. °· You have  sudden, unexplained discomfort in your arms, back, neck, or jaw. °· You have shortness of breath at any time. °· You suddenly start to sweat, or your skin gets clammy. °· You feel nauseous. °· You vomit. °· You suddenly feel light-headed or dizzy. °· Your heart begins to beat quickly, or it feels like it is skipping beats. °These symptoms may be an emergency. Do not wait to see if the symptoms will go away. Get medical help right away. Call your local emergency services (911 in the [place] Do not drive yourself to the hospital. °  °This information is not intended to replace advice given to you by your health care provider. Make sure you discuss any questions you have with your health care provider. °  °Document Released: 04/20/2008 Document Revised: 11/23/2014 Document Reviewed: 06/08/2014 °Elsevier Interactive Patient Education ©2016 [employer] ° °

## 2016-01-22 ENCOUNTER — Other Ambulatory Visit: Payer: Self-pay | Admitting: Family Medicine

## 2016-01-22 DIAGNOSIS — R079 Chest pain, unspecified: Secondary | ICD-10-CM | POA: Diagnosis not present

## 2016-01-22 LAB — TROPONIN I: Troponin I: 0.03 ng/mL (ref <0.031)

## 2016-01-22 MED ORDER — KETOROLAC TROMETHAMINE 30 MG/ML IJ SOLN: 30.0000 mg | Freq: Once | INTRAMUSCULAR | Status: DC

## 2016-01-22 MED ORDER — KETOROLAC TROMETHAMINE 60 MG/2ML IM SOLN
60.0000 mg | Freq: Once | INTRAMUSCULAR | Status: AC
  Administered 2016-01-22: 60 mg via INTRAMUSCULAR
  Filled 2016-01-22: qty 2

## 2016-01-22 NOTE — ED Provider Notes (Signed)
-----------------------------------------   1:26 AM on 01/22/2016 -----------------------------------------   Blood pressure 133/81, pulse 71, temperature 98.4 F (36.9 C), temperature source Oral, resp. rate 18, height 5' 7"  (1.702 m), weight 230 lb (104.327 kg), SpO2 96 %.  Assuming care from Dr. Archie Balboa.  In short, Lawrence [name] is a 60 y.o. male with a chief complaint of Chest Pain .  Refer to the original H&P for additional details.  The current plan of care is to follow-up the results of the repeat troponin.  The patient's repeat troponin is negative. He will be discharged home to follow-up with his cardiologist.   [name], MD 01/22/16 [phone]

## 2016-01-23 ENCOUNTER — Ambulatory Visit
Admission: RE | Admit: 2016-01-23 | Discharge: 2016-01-23 | Disposition: A | Discharge status: Home / Self Care | Payer: Commercial Managed Care - HMO | Source: Ambulatory Visit | Attending: Internal Medicine | Admitting: Internal Medicine

## 2016-01-23 ENCOUNTER — Other Ambulatory Visit: Payer: Self-pay | Admitting: Internal Medicine

## 2016-01-23 DIAGNOSIS — J441 Chronic obstructive pulmonary disease with (acute) exacerbation: Secondary | ICD-10-CM | POA: Diagnosis not present

## 2016-01-23 DIAGNOSIS — J9611 Chronic respiratory failure with hypoxia: Secondary | ICD-10-CM | POA: Diagnosis not present

## 2016-01-23 DIAGNOSIS — Z951 Presence of aortocoronary bypass graft: Secondary | ICD-10-CM | POA: Insufficient documentation

## 2016-01-23 DIAGNOSIS — J449 Chronic obstructive pulmonary disease, unspecified: Secondary | ICD-10-CM | POA: Diagnosis not present

## 2016-01-23 DIAGNOSIS — J9811 Atelectasis: Secondary | ICD-10-CM | POA: Diagnosis not present

## 2016-01-23 DIAGNOSIS — R0602 Shortness of breath: Secondary | ICD-10-CM

## 2016-01-23 DIAGNOSIS — R062 Wheezing: Secondary | ICD-10-CM | POA: Insufficient documentation

## 2016-01-23 DIAGNOSIS — R079 Chest pain, unspecified: Secondary | ICD-10-CM | POA: Diagnosis not present

## 2016-01-23 DIAGNOSIS — F419 Anxiety disorder, unspecified: Secondary | ICD-10-CM | POA: Diagnosis not present

## 2016-01-23 DIAGNOSIS — I5031 Acute diastolic (congestive) heart failure: Secondary | ICD-10-CM | POA: Diagnosis not present

## 2016-01-23 DIAGNOSIS — I4891 Unspecified atrial fibrillation: Secondary | ICD-10-CM | POA: Diagnosis not present

## 2016-01-23 DIAGNOSIS — E785 Hyperlipidemia, unspecified: Secondary | ICD-10-CM | POA: Diagnosis not present

## 2016-01-23 DIAGNOSIS — I110 Hypertensive heart disease with heart failure: Secondary | ICD-10-CM | POA: Diagnosis not present

## 2016-01-23 DIAGNOSIS — I480 Paroxysmal atrial fibrillation: Secondary | ICD-10-CM | POA: Diagnosis not present

## 2016-01-23 DIAGNOSIS — R918 Other nonspecific abnormal finding of lung field: Secondary | ICD-10-CM | POA: Diagnosis not present

## 2016-01-23 DIAGNOSIS — I2510 Atherosclerotic heart disease of native coronary artery without angina pectoris: Secondary | ICD-10-CM | POA: Diagnosis not present

## 2016-01-23 DIAGNOSIS — K219 Gastro-esophageal reflux disease without esophagitis: Secondary | ICD-10-CM | POA: Diagnosis not present

## 2016-01-23 DIAGNOSIS — E119 Type 2 diabetes mellitus without complications: Secondary | ICD-10-CM | POA: Diagnosis not present

## 2016-01-23 DIAGNOSIS — I25708 Atherosclerosis of coronary artery bypass graft(s), unspecified, with other forms of angina pectoris: Secondary | ICD-10-CM | POA: Diagnosis not present

## 2016-01-23 DIAGNOSIS — G4733 Obstructive sleep apnea (adult) (pediatric): Secondary | ICD-10-CM | POA: Diagnosis not present

## 2016-01-24 DIAGNOSIS — I10 Essential (primary) hypertension: Secondary | ICD-10-CM | POA: Diagnosis not present

## 2016-01-24 DIAGNOSIS — E119 Type 2 diabetes mellitus without complications: Secondary | ICD-10-CM | POA: Diagnosis not present

## 2016-01-24 DIAGNOSIS — I5030 Unspecified diastolic (congestive) heart failure: Secondary | ICD-10-CM | POA: Diagnosis not present

## 2016-01-24 DIAGNOSIS — I480 Paroxysmal atrial fibrillation: Secondary | ICD-10-CM | POA: Diagnosis not present

## 2016-01-24 DIAGNOSIS — J449 Chronic obstructive pulmonary disease, unspecified: Secondary | ICD-10-CM | POA: Diagnosis not present

## 2016-01-24 DIAGNOSIS — R079 Chest pain, unspecified: Secondary | ICD-10-CM | POA: Diagnosis not present

## 2016-01-24 DIAGNOSIS — I2510 Atherosclerotic heart disease of native coronary artery without angina pectoris: Secondary | ICD-10-CM | POA: Diagnosis not present

## 2016-01-25 DIAGNOSIS — I5023 Acute on chronic systolic (congestive) heart failure: Secondary | ICD-10-CM | POA: Diagnosis not present

## 2016-01-25 DIAGNOSIS — I480 Paroxysmal atrial fibrillation: Secondary | ICD-10-CM | POA: Diagnosis not present

## 2016-01-25 DIAGNOSIS — I2510 Atherosclerotic heart disease of native coronary artery without angina pectoris: Secondary | ICD-10-CM | POA: Diagnosis not present

## 2016-01-25 DIAGNOSIS — J441 Chronic obstructive pulmonary disease with (acute) exacerbation: Secondary | ICD-10-CM | POA: Diagnosis not present

## 2016-01-28 ENCOUNTER — Ambulatory Visit: Payer: Medicare PPO | Admitting: Family Medicine

## 2016-02-04 ENCOUNTER — Other Ambulatory Visit: Payer: Self-pay | Admitting: Family Medicine

## 2016-02-04 NOTE — Telephone Encounter (Signed)
[facility] needs ok for his Eliquis 61m table #60 1 tab by mouth twice a day.  [facility]  Their call back [phone]  Thanks TCon Memos

## 2016-02-05 ENCOUNTER — Other Ambulatory Visit: Payer: Self-pay | Admitting: *Deleted

## 2016-02-05 NOTE — Telephone Encounter (Signed)
Pt called about his RX.  Wantin gto know if you can send the Eliquis in.  Thanks Con Memos

## 2016-02-05 NOTE — Telephone Encounter (Signed)
Pharmacy called wanting to know if we had received this refill request. She states patient came into the pharmacy late last night requesting a refill and told them he was out of this medication.

## 2016-02-05 NOTE — Telephone Encounter (Signed)
Request has already been sent to provider.

## 2016-02-06 DIAGNOSIS — I5030 Unspecified diastolic (congestive) heart failure: Secondary | ICD-10-CM | POA: Diagnosis not present

## 2016-02-06 DIAGNOSIS — I4891 Unspecified atrial fibrillation: Secondary | ICD-10-CM | POA: Diagnosis not present

## 2016-02-08 DIAGNOSIS — G4733 Obstructive sleep apnea (adult) (pediatric): Secondary | ICD-10-CM | POA: Diagnosis not present

## 2016-02-10 DIAGNOSIS — Z951 Presence of aortocoronary bypass graft: Secondary | ICD-10-CM | POA: Diagnosis not present

## 2016-02-10 DIAGNOSIS — J439 Emphysema, unspecified: Secondary | ICD-10-CM | POA: Diagnosis not present

## 2016-02-10 DIAGNOSIS — Z955 Presence of coronary angioplasty implant and graft: Secondary | ICD-10-CM | POA: Diagnosis not present

## 2016-02-10 DIAGNOSIS — J984 Other disorders of lung: Secondary | ICD-10-CM | POA: Diagnosis not present

## 2016-02-10 DIAGNOSIS — I110 Hypertensive heart disease with heart failure: Secondary | ICD-10-CM | POA: Diagnosis not present

## 2016-02-10 DIAGNOSIS — I25110 Atherosclerotic heart disease of native coronary artery with unstable angina pectoris: Secondary | ICD-10-CM | POA: Diagnosis not present

## 2016-02-10 DIAGNOSIS — I10 Essential (primary) hypertension: Secondary | ICD-10-CM | POA: Diagnosis not present

## 2016-02-10 DIAGNOSIS — R918 Other nonspecific abnormal finding of lung field: Secondary | ICD-10-CM | POA: Diagnosis not present

## 2016-02-10 DIAGNOSIS — R200 Anesthesia of skin: Secondary | ICD-10-CM | POA: Diagnosis not present

## 2016-02-10 DIAGNOSIS — J9811 Atelectasis: Secondary | ICD-10-CM | POA: Diagnosis not present

## 2016-02-10 DIAGNOSIS — I5033 Acute on chronic diastolic (congestive) heart failure: Secondary | ICD-10-CM | POA: Diagnosis not present

## 2016-02-10 DIAGNOSIS — R0602 Shortness of breath: Secondary | ICD-10-CM | POA: Diagnosis not present

## 2016-02-10 DIAGNOSIS — R079 Chest pain, unspecified: Secondary | ICD-10-CM | POA: Diagnosis not present

## 2016-02-10 DIAGNOSIS — I480 Paroxysmal atrial fibrillation: Secondary | ICD-10-CM | POA: Diagnosis not present

## 2016-02-10 DIAGNOSIS — G4710 Hypersomnia, unspecified: Secondary | ICD-10-CM | POA: Diagnosis not present

## 2016-02-10 DIAGNOSIS — R0789 Other chest pain: Secondary | ICD-10-CM | POA: Diagnosis not present

## 2016-02-10 DIAGNOSIS — J449 Chronic obstructive pulmonary disease, unspecified: Secondary | ICD-10-CM | POA: Diagnosis not present

## 2016-02-11 DIAGNOSIS — I25119 Atherosclerotic heart disease of native coronary artery with unspecified angina pectoris: Secondary | ICD-10-CM | POA: Diagnosis not present

## 2016-02-11 DIAGNOSIS — I5033 Acute on chronic diastolic (congestive) heart failure: Secondary | ICD-10-CM | POA: Diagnosis not present

## 2016-02-11 DIAGNOSIS — I480 Paroxysmal atrial fibrillation: Secondary | ICD-10-CM | POA: Diagnosis not present

## 2016-02-11 DIAGNOSIS — E119 Type 2 diabetes mellitus without complications: Secondary | ICD-10-CM | POA: Diagnosis not present

## 2016-02-11 DIAGNOSIS — I10 Essential (primary) hypertension: Secondary | ICD-10-CM | POA: Diagnosis not present

## 2016-02-11 DIAGNOSIS — J449 Chronic obstructive pulmonary disease, unspecified: Secondary | ICD-10-CM | POA: Diagnosis not present

## 2016-02-18 DIAGNOSIS — I4891 Unspecified atrial fibrillation: Secondary | ICD-10-CM | POA: Diagnosis not present

## 2016-02-18 DIAGNOSIS — E119 Type 2 diabetes mellitus without complications: Secondary | ICD-10-CM | POA: Diagnosis not present

## 2016-02-18 DIAGNOSIS — I2510 Atherosclerotic heart disease of native coronary artery without angina pectoris: Secondary | ICD-10-CM | POA: Diagnosis not present

## 2016-02-18 DIAGNOSIS — E785 Hyperlipidemia, unspecified: Secondary | ICD-10-CM | POA: Diagnosis not present

## 2016-02-18 DIAGNOSIS — I348 Other nonrheumatic mitral valve disorders: Secondary | ICD-10-CM | POA: Diagnosis not present

## 2016-02-18 DIAGNOSIS — F17210 Nicotine dependence, cigarettes, uncomplicated: Secondary | ICD-10-CM | POA: Diagnosis not present

## 2016-02-18 DIAGNOSIS — I110 Hypertensive heart disease with heart failure: Secondary | ICD-10-CM | POA: Diagnosis not present

## 2016-02-18 DIAGNOSIS — I5030 Unspecified diastolic (congestive) heart failure: Secondary | ICD-10-CM | POA: Diagnosis not present

## 2016-02-18 DIAGNOSIS — J449 Chronic obstructive pulmonary disease, unspecified: Secondary | ICD-10-CM | POA: Diagnosis not present

## 2016-02-19 ENCOUNTER — Encounter: Payer: Self-pay | Admitting: Family Medicine

## 2016-02-19 ENCOUNTER — Ambulatory Visit (INDEPENDENT_AMBULATORY_CARE_PROVIDER_SITE_OTHER): Payer: Medicare PPO | Admitting: Family Medicine

## 2016-02-19 VITALS — BP 94/52 | HR 71 | Temp 97.5°F | Resp 18 | Ht 68.0 in | Wt 242.0 lb

## 2016-02-19 DIAGNOSIS — I200 Unstable angina: Secondary | ICD-10-CM | POA: Diagnosis not present

## 2016-02-19 DIAGNOSIS — E1159 Type 2 diabetes mellitus with other circulatory complications: Secondary | ICD-10-CM

## 2016-02-19 DIAGNOSIS — I5032 Chronic diastolic (congestive) heart failure: Secondary | ICD-10-CM | POA: Diagnosis not present

## 2016-02-19 DIAGNOSIS — M5410 Radiculopathy, site unspecified: Secondary | ICD-10-CM

## 2016-02-19 LAB — HEMOGLOBIN A1C
A1c: 6.5
Est. average glucose Bld gHb Est-mCnc: 140

## 2016-02-19 MED ORDER — OXYCODONE-ACETAMINOPHEN 10-325 MG PO TABS: 1.0000 | ORAL_TABLET | ORAL | Status: DC | PRN

## 2016-02-19 NOTE — Progress Notes (Signed)
Patient: Lawrence [name] Male    DO: 1956-03-18   60 y.o.   MRM: [id number] Visit Date: 02/19/2016  Today's Provider: [name], MD   Chief Complaint  Patient presents with  . Hospitalization Follow-up   Subjective:    HPI   Follow up Hospitalization  Patient was admitted and discharged to [facility] on 02/11/2016 He was treated for Chest pain and diastolic CHF exacerbation He was changed from Bumex to furosemide 32m BID Patient had follow-up with cardiology Dr. RGerome Sam4/4/17 Labs came back today and patient was called today and told he was dehydrated and to cut back one furosemide Was prescribed  allopurinol 3031mby cardioogist for chronic chest pain He reports good compliance with treatment. He reports this condition is Unchanged.  ----------------------------------------------------------------------    Patient saw Dr. RaGerome SamCardiologist yesterday and was told that he is dehydrated.    Allergies  Allergen Reactions  . Demerol [Meperidine] Hives  . Prednisone Hypertension    Reaction: pt states it puts him in afib.  . Sulfa Antibiotics Hives  . Albuterol Sulfate [Albuterol] Palpitations and Other (See Comments)    Pt still currently uses this medication.    . Morphine Sulfate Nausea And Vomiting, Rash and Other (See Comments)    Pt states that he is only allergic to the tablet form of this medication.     Previous Medications   ACETAMINOPHEN (TYLENOL) 325 MG TABLET    Take 2 tablets (650 mg total) by mouth every 6 (six) hours as needed for mild pain or headache.   ALBUTEROL (PROVENTIL HFA;VENTOLIN HFA) 108 (90 BASE) MCG/ACT INHALER    Inhale 4-6 puffs by mouth every 4 hours as needed for wheezing, cough, and/or shortness of breath   ALPRAZOLAM (XANAX) 1 MG TABLET    Take 0.5-1 tablets (0.5-1 mg total) by mouth 3 (three) times daily as needed for anxiety.   AMLODIPINE (NORVASC) 10 MG TABLET    Take 10 mg by mouth daily.   ATORVASTATIN (LIPITOR) 80 MG TABLET     Take 1 tablet (80 mg total) by mouth at bedtime.   CETIRIZINE (ZYRTEC) 10 MG TABLET    Take 10 mg by mouth at bedtime.   CITALOPRAM (CELEXA) 20 MG TABLET    Take 20 mg by mouth daily.   CLOPIDOGREL (PLAVIX) 75 MG TABLET    Take 1 tablet (75 mg total) by mouth daily.   CYCLOBENZAPRINE (FLEXERIL) 10 MG TABLET    Take 1 tablet (10 mg total) by mouth every 8 (eight) hours as needed for muscle spasms.   ELIQUIS 5 MG TABS TABLET    TAKE 1 TABLET BY MOUTH TWICE A DAY.   FUROSEMIDE (LASIX) 80 MG TABLET    Take 80 mg by mouth 2 (two) times daily.   ISOSORBIDE MONONITRATE (IMDUR) 60 MG 24 HR TABLET    Take 120 mg by mouth daily.    LANSOPRAZOLE (PREVACID) 30 MG CAPSULE    Take 30 mg by mouth daily at 12 noon.   MAGNESIUM OXIDE (MAG-OX) 400 MG TABLET    Take 400 mg by mouth.   METFORMIN (GLUCOPHAGE) 500 MG TABLET    Take 500 mg by mouth daily.   METOCLOPRAMIDE (REGLAN) 10 MG TABLET    Take 1 tablet (10 mg total) by mouth every 6 (six) hours as needed for nausea.   MOMETASONE-FORMOTEROL (DULERA) 200-5 MCG/ACT AERO    Inhale 1 puff into the lungs 2 (two) times daily.  NAPROXEN (NAPROSYN) 500 MG TABLET    Take 1 tablet (500 mg total) by mouth 2 (two) times daily with a meal.   NITROGLYCERIN (NITROSTAT) 0.4 MG SL TABLET    Place 0.4 mg under the tongue every 5 (five) minutes as needed for chest pain.   OMEGA-3 ACID ETHYL ESTERS (LOVAZA) 1 G CAPSULE    Take 4 g by mouth daily.   OXYCODONE-ACETAMINOPHEN (PERCOCET) 10-325 MG TABLET    Take 1 tablet by mouth every 4 (four) hours as needed for pain.   POTASSIUM CHLORIDE (K-DUR,KLOR-CON) 10 MEQ TABLET    Take 10 mEq by mouth daily.   RANOLAZINE (RANEXA) 500 MG 12 HR TABLET    Take 1,000 mg by mouth 2 (two) times daily.   SOTALOL (BETAPACE) 120 MG TABLET    Take 120 mg by mouth.   SPIRIVA HANDIHALER 18 MCG INHALATION CAPSULE    INHALE ONE CAPSULE AS DIRECTED ONCE A DAY   TAMSULOSIN (FLOMAX) 0.4 MG CAPS CAPSULE    Take 0.4 mg by mouth at bedtime.    Lab Results    Component Value Date   HGBA1C 6.7* 09/12/2015     Review of Systems  Constitutional: Negative for fever, chills and appetite change.  Respiratory: Negative for chest tightness, shortness of breath and wheezing.   Cardiovascular: Positive for chest pain and palpitations.  Gastrointestinal: Negative for nausea, vomiting and abdominal pain.    Social History  Substance Use Topics  . Smoking status: Former Smoker    Quit date: 04/22/2013  . Smokeless tobacco: Never Used  . Alcohol Use: No     Comment: remotely quit alcohol use. Hx of heavy alcohol use.   Objective:   BP 94/52 mmHg  Pulse 71  Temp(Src) 97.5 F (36.4 C) (Oral)  Resp 18  Ht 5' 8"  (1.727 m)  Wt 242 lb (109.77 kg)  BMI 36.80 kg/m2  SpO2 97% on O2 2lpm.   Physical Exam  General Appearance:    Alert, cooperative, no distress, obese  Eyes:    PERRL, conjunctiva/corneas clear, EOM's intact       Lungs:     Clear to auscultation bilaterally, respirations unlabored  Heart:    Regular rate and rhythm  Neurologic:   Awake, alert, oriented x 3. No apparent focal neurological           defect.       Results for orders placed or performed in visit on 02/19/16  Hemoglobin A1c  Result Value Ref Range   A1c 6.5    Est. average glucose Bld gHb Est-mCnc 140         Assessment & Plan:     1. Back pain with left-sided radiculopathy Stable on current medications.  - oxyCODONE-acetaminophen (PERCOCET) 10-325 MG tablet; Take 1 tablet by mouth every 4 (four) hours as needed for pain.  Dispense: 150 tablet; Refill: 0  2. 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  Continue current medications.   - Hemoglobin A1c  3. Unstable angina (HCC) Continue current medications, follow up cardiology as scheduled.   4. Chronic diastolic CHF (congestive heart failure) (HCC) Inproved with change to furosemide. Apparently labs from cardiologist showed dehydration and is going to cut back to  QD furosemide.   Follow up diabetes and COPD in 3 months.         [name], MD  [facility]

## 2016-02-25 DIAGNOSIS — J9611 Chronic respiratory failure with hypoxia: Secondary | ICD-10-CM | POA: Diagnosis not present

## 2016-02-25 DIAGNOSIS — G4733 Obstructive sleep apnea (adult) (pediatric): Secondary | ICD-10-CM | POA: Diagnosis not present

## 2016-02-25 DIAGNOSIS — I25708 Atherosclerosis of coronary artery bypass graft(s), unspecified, with other forms of angina pectoris: Secondary | ICD-10-CM | POA: Diagnosis not present

## 2016-02-25 DIAGNOSIS — J449 Chronic obstructive pulmonary disease, unspecified: Secondary | ICD-10-CM | POA: Diagnosis not present

## 2016-02-26 ENCOUNTER — Emergency Department
Admission: EM | Admit: 2016-02-26 | Discharge: 2016-02-27 | Disposition: A | Discharge status: Home / Self Care | Payer: Commercial Managed Care - HMO | Attending: Emergency Medicine | Admitting: Emergency Medicine

## 2016-02-26 ENCOUNTER — Other Ambulatory Visit: Payer: Self-pay | Admitting: Physician Assistant

## 2016-02-26 ENCOUNTER — Encounter: Payer: Self-pay | Admitting: Emergency Medicine

## 2016-02-26 ENCOUNTER — Emergency Department: Payer: Commercial Managed Care - HMO

## 2016-02-26 DIAGNOSIS — J45909 Unspecified asthma, uncomplicated: Secondary | ICD-10-CM | POA: Diagnosis not present

## 2016-02-26 DIAGNOSIS — J440 Chronic obstructive pulmonary disease with acute lower respiratory infection: Secondary | ICD-10-CM | POA: Diagnosis not present

## 2016-02-26 DIAGNOSIS — R079 Chest pain, unspecified: Secondary | ICD-10-CM | POA: Diagnosis not present

## 2016-02-26 DIAGNOSIS — I110 Hypertensive heart disease with heart failure: Secondary | ICD-10-CM | POA: Diagnosis not present

## 2016-02-26 DIAGNOSIS — Z87891 Personal history of nicotine dependence: Secondary | ICD-10-CM | POA: Insufficient documentation

## 2016-02-26 DIAGNOSIS — E785 Hyperlipidemia, unspecified: Secondary | ICD-10-CM | POA: Diagnosis not present

## 2016-02-26 DIAGNOSIS — I5032 Chronic diastolic (congestive) heart failure: Secondary | ICD-10-CM | POA: Insufficient documentation

## 2016-02-26 DIAGNOSIS — J449 Chronic obstructive pulmonary disease, unspecified: Secondary | ICD-10-CM | POA: Insufficient documentation

## 2016-02-26 DIAGNOSIS — Z9049 Acquired absence of other specified parts of digestive tract: Secondary | ICD-10-CM | POA: Insufficient documentation

## 2016-02-26 DIAGNOSIS — Z885 Allergy status to narcotic agent status: Secondary | ICD-10-CM | POA: Insufficient documentation

## 2016-02-26 DIAGNOSIS — Z7984 Long term (current) use of oral hypoglycemic drugs: Secondary | ICD-10-CM | POA: Diagnosis not present

## 2016-02-26 DIAGNOSIS — Z881 Allergy status to other antibiotic agents status: Secondary | ICD-10-CM | POA: Diagnosis not present

## 2016-02-26 DIAGNOSIS — J189 Pneumonia, unspecified organism: Secondary | ICD-10-CM

## 2016-02-26 DIAGNOSIS — R0602 Shortness of breath: Secondary | ICD-10-CM

## 2016-02-26 DIAGNOSIS — R1012 Left upper quadrant pain: Secondary | ICD-10-CM | POA: Insufficient documentation

## 2016-02-26 DIAGNOSIS — Z888 Allergy status to other drugs, medicaments and biological substances status: Secondary | ICD-10-CM | POA: Diagnosis not present

## 2016-02-26 DIAGNOSIS — E119 Type 2 diabetes mellitus without complications: Secondary | ICD-10-CM | POA: Insufficient documentation

## 2016-02-26 DIAGNOSIS — I2510 Atherosclerotic heart disease of native coronary artery without angina pectoris: Secondary | ICD-10-CM | POA: Insufficient documentation

## 2016-02-26 DIAGNOSIS — J181 Lobar pneumonia, unspecified organism: Secondary | ICD-10-CM | POA: Diagnosis not present

## 2016-02-26 DIAGNOSIS — R109 Unspecified abdominal pain: Secondary | ICD-10-CM

## 2016-02-26 DIAGNOSIS — Z79899 Other long term (current) drug therapy: Secondary | ICD-10-CM | POA: Insufficient documentation

## 2016-02-26 LAB — BASIC METABOLIC PANEL
Anion gap: 6 (ref 5–15)
BUN: 17 mg/dL (ref 6–20)
CO2: 29 mmol/L (ref 22–32)
Calcium: 9.2 mg/dL (ref 8.9–10.3)
Chloride: 101 mmol/L (ref 101–111)
Creatinine, Ser: 1.11 mg/dL (ref 0.61–1.24)
GFR calc Af Amer: >60 mL/min (ref >60)
GFR calc non Af Amer: >60 mL/min (ref >60)
Glucose, Bld: 138 mg/dL — ABNORMAL HIGH (ref 65–99)
Potassium: 3.6 mmol/L (ref 3.5–5.1)
Sodium: 136 mmol/L (ref 135–145)

## 2016-02-26 LAB — CBC
HCT: 36.2 % — ABNORMAL LOW (ref 40.0–52.0)
Hemoglobin: 11.7 g/dL — ABNORMAL LOW (ref 13.0–18.0)
MCH: 25.1 pg — ABNORMAL LOW (ref 26.0–34.0)
MCHC: 32.3 g/dL (ref 32.0–36.0)
MCV: 77.7 fL — ABNORMAL LOW (ref 80.0–100.0)
Platelets: 223 10*3/uL (ref 150–440)
RBC: 4.66 MIL/uL (ref 4.40–5.90)
RDW: 19.4 % — ABNORMAL HIGH (ref 11.5–14.5)
WBC: 5.8 10*3/uL (ref 3.8–10.6)

## 2016-02-26 LAB — TROPONIN I: Troponin I: <0.03 ng/mL (ref <0.031)

## 2016-02-26 MED ORDER — HYDROMORPHONE HCL 1 MG/ML IJ SOLN
0.5000 mg | Freq: Once | INTRAMUSCULAR | Status: AC
  Administered 2016-02-26: 0.5 mg via INTRAVENOUS
  Filled 2016-02-26: qty 1

## 2016-02-26 MED ORDER — ONDANSETRON 8 MG PO TBDP
8.0000 mg | ORAL_TABLET | Freq: Once | ORAL | Status: AC
  Administered 2016-02-26: 8 mg via ORAL
  Filled 2016-02-26: qty 1

## 2016-02-26 NOTE — ED Notes (Signed)
Pt presents to ED with left side chest pain and shortness of breath. Pt states condition is recurrent but has worsened today. Pt wears 2L O2 at home. Pt reports increase in work of breath today.

## 2016-02-27 ENCOUNTER — Emergency Department: Payer: Commercial Managed Care - HMO

## 2016-02-27 DIAGNOSIS — R079 Chest pain, unspecified: Secondary | ICD-10-CM | POA: Diagnosis not present

## 2016-02-27 DIAGNOSIS — J181 Lobar pneumonia, unspecified organism: Secondary | ICD-10-CM | POA: Diagnosis not present

## 2016-02-27 LAB — HEPATIC FUNCTION PANEL
ALT: 22 U/L (ref 17–63)
AST: 23 U/L (ref 15–41)
Albumin: 3.8 g/dL (ref 3.5–5.0)
Alkaline Phosphatase: 70 U/L (ref 38–126)
Bilirubin, Direct: <0.1 mg/dL — ABNORMAL LOW (ref 0.1–0.5)
Total Bilirubin: 0.4 mg/dL (ref 0.3–1.2)
Total Protein: 6.9 g/dL (ref 6.5–8.1)

## 2016-02-27 LAB — LIPASE, BLOOD: Lipase: 21 U/L (ref 11–51)

## 2016-02-27 MED ORDER — HYDROMORPHONE HCL 1 MG/ML IJ SOLN
0.5000 mg | Freq: Once | INTRAMUSCULAR | Status: AC
Start: 2016-02-27 — End: 2016-02-27
  Administered 2016-02-27: 0.5 mg via INTRAVENOUS
  Filled 2016-02-27: qty 1

## 2016-02-27 MED ORDER — AZITHROMYCIN 500 MG PO TABS
500.0000 mg | ORAL_TABLET | Freq: Once | ORAL | Status: AC
  Administered 2016-02-27: 500 mg via ORAL
  Filled 2016-02-27: qty 1

## 2016-02-27 MED ORDER — IOPAMIDOL (ISOVUE-370) INJECTION 76%
125.0000 mL | Freq: Once | INTRAVENOUS | Status: AC | PRN
  Administered 2016-02-27: 125 mL via INTRAVENOUS

## 2016-02-27 MED ORDER — AZITHROMYCIN 250 MG PO TABS: ORAL_TABLET | ORAL | Status: DC

## 2016-02-27 NOTE — ED Provider Notes (Signed)
-----------------------------------------   2:24 AM on 02/27/2016 -----------------------------------------  Labs have resulted largely within normal limits. CT scans are negative. Given the questionable pneumonitis on x-ray with the patient's complaint of shortness of breath we will cover with Zithromax. Patient is follow up with his pulmonologist. Patient agreeable plan.  Harvest Dark, MD 02/27/16 [phone]

## 2016-02-27 NOTE — ED Provider Notes (Signed)
[facility] Emergency Department Provider Note   ____________________________________________  Time seen: I have reviewed the triage vital signs and the triage nursing note.  HISTORY  Chief Complaint Chest Pain and Shortness of Breath   Historian Patient  HPI Lawrence [name] is a 60 y.o. male with a history of coronary disease, as well as COPD for which she follows with Dr. Darrow Bussing for pulmonology and states he was seen there last week and told that he had a problem with the base of his heart and was scheduled for some sort of chest CT for this coming Tuesday.  He does wear home 2 L oxygen. He's had a little bit of a cough with brown sputum.  He's had some increased leg swelling for which she has increased his Lasix from 80 mg to 120 mg starting yesterday.  He is here for left-sided "base of the heart" chest pain and when he points he is pointing to his upper abdomen just below the rib. He said he has not had 10 pain. Been going on since this afternoon for several hours now. No nausea or vomiting. No fever.    Past Medical History  Diagnosis Date  . CAD (coronary artery disease)     a. 2002 CABGx2 (LIMA->LAD, VG->VG->OM1);  b. 09/2012 DES->OM;  c. 03/2015 PTCA of LAD [facility]) in setting of atretic LIMA; d. 05/2015 Cath [facility]): nonobs dzs; e. 06/2015 Cath ([facility]): LM nl, LAD 45p/d ISR, 50d, D1/2 small, LCX 50p/d ISR, OM1 70ost, 30 ISR, VG->OM1 50ost, 10m LIMA->LAD 99p/d - atretic, RCA dom, nl; f.cath 10/16: 40-50%(FFR 0.90) pLAD, 75% (FFR 0.77) mLAD s/p PCI/DES, oRCA 40% (FFR0.95)  . Essential hypertension   . COPD (chronic obstructive pulmonary disease) ([place]     a. Chronic bronchitis and emphysema.  . Type II diabetes mellitus ([place]   . Asthma   . Celiac disease   . Chronic diastolic CHF (congestive heart failure) ([place]     a. 06/2009 Echo: EF 60-65%, Gr 1 DD, triv AI, mildly dil LA, nl RV.  .Marland KitchenAnxiety   . History of tobacco abuse     a. Quit 2014.  .Marland KitchenPSVT  (paroxysmal supraventricular tachycardia) ([place]     a. 10/2012 Noted on Zio Patch.  . Dysrhythmia   . A-fib ([facility]     Patient Active Problem List   Diagnosis Date Noted  . Paroxysmal atrial fibrillation ([place] 12/23/2015  . OSA (obstructive sleep apnea) 12/10/2015  . Left inguinal hernia 11/07/2015  . Acute anxiety 11/07/2015  . Unstable angina ([place] 10/05/2015  . PAF (paroxysmal atrial fibrillation) ([place] 10/05/2015  . Back pain with left-sided radiculopathy 09/30/2015  . Nocturnal hypoxia 09/06/2015  . BPH (benign prostatic hyperplasia) 08/01/2015  . Chronic diastolic CHF (congestive heart failure) ([place]   . Angina at rest ([facility]   . Chest pain 07/11/2015  . COPD (chronic obstructive pulmonary disease) ([place] 07/03/2015  . CAD (coronary artery disease) 06/26/2015  . HTN (hypertension) 06/26/2015  . DM (diabetes mellitus) ([place] 06/26/2015  . Achalasia 07/24/2014  . GERD (gastroesophageal reflux disease) 06/07/2014  . Former tobacco use 04/11/2013  . Chest pain at rest 04/09/2013  . HLD (hyperlipidemia) 04/09/2013    Past Surgical History  Procedure Laterality Date  . Vascular surgery    . Bypass graft    . Tonsillectomy    . Cholecystectomy    . Cardiac catheterization N/A 07/12/2015    rocedure: Left Heart Cath and Cors/Grafts Angiography;  Surgeon: HBelva Crome MD;  Location: [place] CV LAB;  Service: Cardiovascular;  Laterality: N/A;  . Esophageal dilation    . Cardiac catheterization Right 10/07/2015    Procedure: Left Heart Cath and Cors/Grafts Angiography;  Surgeon: Dionisio David, MD;  Location: [place] CV LAB;  Service: Cardiovascular;  Laterality: Right;    Current Outpatient Rx  Name  Route  Sig  Dispense  Refill  . acetaminophen (TYLENOL) 325 MG tablet   Oral   Take 2 tablets (650 mg total) by mouth every 6 (six) hours as needed for mild pain or headache.         . albuterol (PROVENTIL HFA;VENTOLIN HFA) 108 (90 Base) MCG/ACT inhaler       Inhale 4-6 puffs by mouth every 4 hours as needed for wheezing, cough, and/or shortness of breath   1 Inhaler   1   . ALPRAZolam (XANAX) 1 MG tablet   Oral   Take 0.5-1 tablets (0.5-1 mg total) by mouth 3 (three) times daily as needed for anxiety.   90 tablet   3   . amLODipine (NORVASC) 10 MG tablet   Oral   Take 10 mg by mouth daily.         Marland Kitchen atorvastatin (LIPITOR) 80 MG tablet   Oral   Take 1 tablet (80 mg total) by mouth at bedtime.   90 tablet   3   . azithromycin (ZITHROMAX) 250 MG tablet      1 tablet once daily for 4 days   4 each   0   . cetirizine (ZYRTEC) 10 MG tablet   Oral   Take 10 mg by mouth at bedtime.         . citalopram (CELEXA) 20 MG tablet   Oral   Take 20 mg by mouth daily.         . clopidogrel (PLAVIX) 75 MG tablet   Oral   Take 1 tablet (75 mg total) by mouth daily.   30 tablet   5   . cyclobenzaprine (FLEXERIL) 10 MG tablet   Oral   Take 1 tablet (10 mg total) by mouth every 8 (eight) hours as needed for muscle spasms.   30 tablet   5   . ELIQUIS 5 MG TABS tablet      TAKE 1 TABLET BY MOUTH TWICE A DAY.   60 tablet   3   . furosemide (LASIX) 80 MG tablet   Oral   Take 80 mg by mouth 2 (two) times daily.         . isosorbide mononitrate (IMDUR) 60 MG 24 hr tablet   Oral   Take 120 mg by mouth daily.          . lansoprazole (PREVACID) 30 MG capsule   Oral   Take 30 mg by mouth daily at 12 noon.         . magnesium oxide (MAG-OX) 400 MG tablet   Oral   Take 400 mg by mouth.         . metFORMIN (GLUCOPHAGE) 500 MG tablet   Oral   Take 500 mg by mouth daily.         . metoCLOPramide (REGLAN) 10 MG tablet   Oral   Take 1 tablet (10 mg total) by mouth every 6 (six) hours as needed for nausea.   15 tablet   0   . mometasone-formoterol (DULERA) 200-5 MCG/ACT AERO   Inhalation   Inhale 1 puff into the lungs 2 (  two) times daily.         . naproxen (NAPROSYN) 500 MG tablet   Oral   Take 1 tablet (500  mg total) by mouth 2 (two) times daily with a meal.   60 tablet   0   . nitroGLYCERIN (NITROSTAT) 0.4 MG SL tablet   Sublingual   Place 0.4 mg under the tongue every 5 (five) minutes as needed for chest pain.         Marland Kitchen omega-3 acid ethyl esters (LOVAZA) 1 g capsule   Oral   Take 4 g by mouth daily.         Marland Kitchen oxyCODONE-acetaminophen (PERCOCET) 10-325 MG tablet   Oral   Take 1 tablet by mouth every 4 (four) hours as needed for pain.   150 tablet   0   . potassium chloride (K-DUR,KLOR-CON) 10 MEQ tablet   Oral   Take 10 mEq by mouth daily.         . ranolazine (RANEXA) 500 MG 12 hr tablet   Oral   Take 1,000 mg by mouth 2 (two) times daily.         . sotalol (BETAPACE) 120 MG tablet   Oral   Take 120 mg by mouth.         . SPIRIVA HANDIHALER 18 MCG inhalation capsule      INHALE ONE CAPSULE AS DIRECTED ONCE A DAY   30 capsule   5   . tamsulosin (FLOMAX) 0.4 MG CAPS capsule   Oral   Take 0.4 mg by mouth at bedtime.            Allergies Demerol; Prednisone; Sulfa antibiotics; Albuterol sulfate; and Morphine sulfate  Family History  Problem Relation Age of Onset  . Heart attack Mother   . Cirrhosis Mother   . Diabetes Mother   . Depression Mother   . Heart disease Mother   . Heart attack Father   . Diabetes Father   . Depression Father   . Heart disease Father   . Parkinson's disease Brother     Social History Social History  Substance Use Topics  . Smoking status: Former Smoker    Quit date: 04/22/2013  . Smokeless tobacco: Never Used  . Alcohol Use: No     Comment: remotely quit alcohol use. Hx of heavy alcohol use.    Review of Systems  Constitutional: Negative for fever. Eyes: Negative for visual changes. ENT: Negative for sore throat. Cardiovascular: Positive for chest pain. Respiratory: Positive for chronic shortness of breath. Positive for mild cough with sputum. Gastrointestinal: Negative for vomiting and diarrhea. Prior  episode of pink otitis felt similar to this. Genitourinary: Negative for dysuria. Musculoskeletal: Negative for back pain. Skin: Negative for rash. Neurological: Negative for headache. 10 point Review of Systems otherwise negative ____________________________________________   PHYSICAL EXAM:  VITAL SIGNS: ED Triage Vitals  Enc Vitals Group     BP 02/26/16 1747 129/64 mmHg     Pulse Rate 02/26/16 1747 71     Resp 02/26/16 1747 18     Temp 02/26/16 1747 97.8 F (36.6 C)     Temp Source 02/26/16 1747 Oral     SpO2 02/26/16 1747 93 %     Weight 02/26/16 1747 241 lb (109.317 kg)     Height 02/26/16 1747 5' 7"  (1.702 m)     Head Cir --      Peak Flow --      Pain Score 02/26/16 1748 8  Pain Loc --      Pain Edu? --      Excl. in Box? --      Constitutional: Alert and oriented. Well appearing and in no distress. HEENT   Head: Normocephalic and atraumatic.      Eyes: Conjunctivae are normal. PERRL. Normal extraocular movements.      Ears:         Nose: No congestion/rhinnorhea.   Mouth/Throat: Mucous membranes are moist.   Neck: No stridor. Cardiovascular/Chest: Normal rate, regular rhythm.  No murmurs, rubs, or gallops. Respiratory: Normal respiratory effort without tachypnea nor retractions. Breath sounds are clear and equal bilaterally. No wheezes/rales/rhonchi. Gastrointestinal: Soft. No distention, no guarding, no rebound. Mild tenderness to palpation in the left upper quadrant. Scar tissue palpable.  Genitourinary/rectal:Deferred Musculoskeletal: Nontender with normal range of motion in all extremities. No joint effusions.  No lower extremity tenderness.  2+ lower extremity pitting edema bilateral lower extremity is.. Neurologic:  Normal speech and language. No gross or focal neurologic deficits are appreciated. Skin:  Skin is warm, dry and intact. No rash noted. Psychiatric: Mood and affect are normal. Speech and behavior are normal. Patient exhibits  appropriate insight and judgment.  ____________________________________________   EKG I, Lisa Roca, MD, the attending physician have personally viewed and interpreted all ECGs.  72 bpm. Normal sinus rhythm. Narrow QRS. Normal axis. Nonspecific T-wave ____________________________________________  LABS (pertinent positives/negatives)  Basic metabolic panel within normal limits White blood count 5.8, hemoglobin 1.7 and platelet count 223 Troponin less than 0.03 Lipase pending Hepatic function panel  ____________________________________________  RADIOLOGY All Xrays were viewed by me. Imaging interpreted by Radiologist.  Chest two-view:  IMPRESSION: Patchy left lower lobe airspace disease which could represent atelectasis or pneumonia.  Chest CT with contrast and abdomen and pelvis with contrast: Pending __________________________________________  PROCEDURES  Procedure(s) performed: None  Critical Care performed: None  ____________________________________________   ED COURSE / ASSESSMENT AND PLAN  Pertinent labs & imaging results that were available during my care of the patient were reviewed by me and considered in my medical decision making (see chart for details).   This patient is here with a complaint of chest pain, but on exam it seems more abdominal. I initially discussed with him that his labs are reassuring for a cardiac standpoint as well as his EKG, but given that he is having some pain into the abdomen, I'm going out on hepatic function panel and lipase. I initially told him I wasn't sure what CT scan was planned for him on Tuesday, and that I wasn't really sure if he was actually having chest or abdominal pain and with a reassuring exam I wasn't sure wanted to do a CT scan.  However, patient states he's having not on a 10 pain, and I'm not sure I can explain this away without imaging.  Patient care transferred to Dr. Kerman Passey at 12:35 AM. Chest and  abdominal CT are pending. Labs are pending. If negative, patient could be discharged home, I will prescribe azithromycin for possible left lower lobe pneumonia seen on chest x-ray.    CONSULTATIONS:   none   Patient / Family / Caregiver informed of clinical course, medical decision-making process, and agree with plan.     ___________________________________________   FINAL CLINICAL IMPRESSION(S) / ED DIAGNOSES   Final diagnoses:  Chest pain, unspecified chest pain type  Left lateral abdominal pain  Left lower lobe pneumonia  Abdominal pain, left lateral  Note: This dictation was prepared with Dragon dictation. Any transcriptional errors that result from this process are unintentional   Lisa Roca, MD 02/27/16 [phone]

## 2016-02-27 NOTE — ED Notes (Signed)
Patient transported to CT 

## 2016-02-27 NOTE — Discharge Instructions (Signed)
Community-Acquired Pneumonia, Adult Pneumonia is an infection of the lungs. One type of pneumonia can happen while a person is in a hospital. A different type can happen when a person is not in a hospital (community-acquired pneumonia). It is easy for this kind to spread from person to person. It can spread to you if you breathe near an infected person who coughs or sneezes. Some symptoms include:  A dry cough.  A wet (productive) cough.  Fever.  Sweating.  Chest pain. HOME CARE  Take over-the-counter and prescription medicines only as told by your doctor.  Only take cough medicine if you are losing sleep.  If you were prescribed an antibiotic medicine, take it as told by your doctor. Do not stop taking the antibiotic even if you start to feel better.  Sleep with your head and neck raised (elevated). You can do this by putting a few pillows under your head, or you can sleep in a recliner.  Do not use tobacco products. These include cigarettes, chewing tobacco, and e-cigarettes. If you need help quitting, ask your doctor.  Drink enough water to keep your pee (urine) clear or pale yellow. A shot (vaccine) can help prevent pneumonia. Shots are often suggested for:  People older than 62 years of age.  People older than 62 years of age:  Who are having cancer treatment.  Who have long-term (chronic) lung disease.  Who have problems with their body's defense system (immune system). You may also prevent pneumonia if you take these actions:  Get the flu (influenza) shot every year.  Go to the dentist as often as told.  Wash your hands often. If soap and water are not available, use hand sanitizer. GET HELP IF:  You have a fever.  You lose sleep because your cough medicine does not help. GET HELP RIGHT AWAY IF:  You are short of breath and it gets worse.  You have more chest pain.  Your sickness gets worse. This is very serious if:  You are an older adult.  Your  body's defense system is weak.  You cough up blood.   This information is not intended to replace advice given to you by your health care provider. Make sure you discuss any questions you have with your health care provider.   Document Released: 04/20/2008 Document Revised: 07/24/2015 Document Reviewed: 02/27/2015 Elsevier Interactive Patient Education [employer].

## 2016-02-29 DIAGNOSIS — Z7901 Long term (current) use of anticoagulants: Secondary | ICD-10-CM | POA: Diagnosis not present

## 2016-02-29 DIAGNOSIS — I25119 Atherosclerotic heart disease of native coronary artery with unspecified angina pectoris: Secondary | ICD-10-CM | POA: Diagnosis not present

## 2016-02-29 DIAGNOSIS — Z9981 Dependence on supplemental oxygen: Secondary | ICD-10-CM | POA: Diagnosis not present

## 2016-02-29 DIAGNOSIS — R0789 Other chest pain: Secondary | ICD-10-CM | POA: Diagnosis not present

## 2016-02-29 DIAGNOSIS — R0600 Dyspnea, unspecified: Secondary | ICD-10-CM | POA: Diagnosis not present

## 2016-02-29 DIAGNOSIS — I110 Hypertensive heart disease with heart failure: Secondary | ICD-10-CM | POA: Diagnosis not present

## 2016-02-29 DIAGNOSIS — J9811 Atelectasis: Secondary | ICD-10-CM | POA: Diagnosis not present

## 2016-02-29 DIAGNOSIS — I509 Heart failure, unspecified: Secondary | ICD-10-CM | POA: Diagnosis not present

## 2016-02-29 DIAGNOSIS — Z7902 Long term (current) use of antithrombotics/antiplatelets: Secondary | ICD-10-CM | POA: Diagnosis not present

## 2016-02-29 DIAGNOSIS — R600 Localized edema: Secondary | ICD-10-CM | POA: Diagnosis not present

## 2016-02-29 DIAGNOSIS — I5033 Acute on chronic diastolic (congestive) heart failure: Secondary | ICD-10-CM | POA: Diagnosis not present

## 2016-02-29 DIAGNOSIS — R918 Other nonspecific abnormal finding of lung field: Secondary | ICD-10-CM | POA: Diagnosis not present

## 2016-02-29 DIAGNOSIS — R079 Chest pain, unspecified: Secondary | ICD-10-CM | POA: Diagnosis not present

## 2016-03-02 ENCOUNTER — Other Ambulatory Visit: Payer: Self-pay | Admitting: *Deleted

## 2016-03-03 ENCOUNTER — Ambulatory Visit
Admission: RE | Admit: 2016-03-03 | Discharge: 2016-03-03 | Disposition: A | Discharge status: Home / Self Care | Payer: Commercial Managed Care - HMO | Source: Ambulatory Visit | Attending: Physician Assistant | Admitting: Physician Assistant

## 2016-03-03 DIAGNOSIS — I2510 Atherosclerotic heart disease of native coronary artery without angina pectoris: Secondary | ICD-10-CM | POA: Insufficient documentation

## 2016-03-03 DIAGNOSIS — Z9049 Acquired absence of other specified parts of digestive tract: Secondary | ICD-10-CM | POA: Diagnosis not present

## 2016-03-03 DIAGNOSIS — I700 Atherosclerosis of aorta: Secondary | ICD-10-CM | POA: Insufficient documentation

## 2016-03-03 DIAGNOSIS — R0602 Shortness of breath: Secondary | ICD-10-CM | POA: Insufficient documentation

## 2016-03-03 DIAGNOSIS — J9811 Atelectasis: Secondary | ICD-10-CM | POA: Diagnosis not present

## 2016-03-03 LAB — POCT I-STAT CREATININE: Creatinine, Ser: 1.4 mg/dL — ABNORMAL HIGH (ref 0.61–1.24)

## 2016-03-03 MED ORDER — IOPAMIDOL (ISOVUE-300) INJECTION 61%
75.0000 mL | Freq: Once | INTRAVENOUS | Status: AC | PRN
Start: 2016-03-03 — End: 2016-03-03
  Administered 2016-03-03: 75 mL via INTRAVENOUS

## 2016-03-03 MED ORDER — RANOLAZINE ER 500 MG PO TB12: 1000.0000 mg | ORAL_TABLET | Freq: Two times a day (BID) | ORAL | Status: DC

## 2016-03-03 NOTE — Telephone Encounter (Signed)
Pt called for refill on ranolazine (RANEXA) 500 MG 12 hr tablet  He said the pharmacy ask for it yesterday and today.  [facility] [employer]  Thanks Con Memos

## 2016-03-03 NOTE — Telephone Encounter (Signed)
This is a medication that i never prescribe and am not familiar with. It is usually only prescribed by cardiologist and i don't know why his cardiologist didn't refill it.   I can send in prescription for one month supply since he is out, but he needs to talk to his cardiologist to take over prescriptions.

## 2016-03-04 NOTE — Telephone Encounter (Signed)
Patient was notified.

## 2016-03-10 DIAGNOSIS — G4733 Obstructive sleep apnea (adult) (pediatric): Secondary | ICD-10-CM | POA: Diagnosis not present

## 2016-03-11 DIAGNOSIS — G4733 Obstructive sleep apnea (adult) (pediatric): Secondary | ICD-10-CM | POA: Diagnosis not present

## 2016-03-12 DIAGNOSIS — J439 Emphysema, unspecified: Secondary | ICD-10-CM | POA: Diagnosis not present

## 2016-03-12 DIAGNOSIS — I10 Essential (primary) hypertension: Secondary | ICD-10-CM | POA: Diagnosis not present

## 2016-03-12 DIAGNOSIS — G4710 Hypersomnia, unspecified: Secondary | ICD-10-CM | POA: Diagnosis not present

## 2016-03-12 DIAGNOSIS — J449 Chronic obstructive pulmonary disease, unspecified: Secondary | ICD-10-CM | POA: Diagnosis not present

## 2016-03-17 DIAGNOSIS — Z7984 Long term (current) use of oral hypoglycemic drugs: Secondary | ICD-10-CM | POA: Diagnosis not present

## 2016-03-17 DIAGNOSIS — M1990 Unspecified osteoarthritis, unspecified site: Secondary | ICD-10-CM | POA: Diagnosis not present

## 2016-03-17 DIAGNOSIS — K219 Gastro-esophageal reflux disease without esophagitis: Secondary | ICD-10-CM | POA: Diagnosis not present

## 2016-03-17 DIAGNOSIS — E669 Obesity, unspecified: Secondary | ICD-10-CM | POA: Diagnosis not present

## 2016-03-17 DIAGNOSIS — G4700 Insomnia, unspecified: Secondary | ICD-10-CM | POA: Diagnosis not present

## 2016-03-17 DIAGNOSIS — I25119 Atherosclerotic heart disease of native coronary artery with unspecified angina pectoris: Secondary | ICD-10-CM | POA: Diagnosis not present

## 2016-03-17 DIAGNOSIS — M545 Low back pain: Secondary | ICD-10-CM | POA: Diagnosis not present

## 2016-03-17 DIAGNOSIS — Z6837 Body mass index (BMI) 37.0-37.9, adult: Secondary | ICD-10-CM | POA: Diagnosis not present

## 2016-03-17 DIAGNOSIS — G4733 Obstructive sleep apnea (adult) (pediatric): Secondary | ICD-10-CM | POA: Diagnosis not present

## 2016-03-17 DIAGNOSIS — I110 Hypertensive heart disease with heart failure: Secondary | ICD-10-CM | POA: Diagnosis not present

## 2016-03-17 DIAGNOSIS — Z951 Presence of aortocoronary bypass graft: Secondary | ICD-10-CM | POA: Diagnosis not present

## 2016-03-17 DIAGNOSIS — Z7901 Long term (current) use of anticoagulants: Secondary | ICD-10-CM | POA: Diagnosis not present

## 2016-03-17 DIAGNOSIS — N400 Enlarged prostate without lower urinary tract symptoms: Secondary | ICD-10-CM | POA: Diagnosis not present

## 2016-03-17 DIAGNOSIS — E1142 Type 2 diabetes mellitus with diabetic polyneuropathy: Secondary | ICD-10-CM | POA: Diagnosis not present

## 2016-03-17 DIAGNOSIS — E785 Hyperlipidemia, unspecified: Secondary | ICD-10-CM | POA: Diagnosis not present

## 2016-03-17 DIAGNOSIS — E1136 Type 2 diabetes mellitus with diabetic cataract: Secondary | ICD-10-CM | POA: Diagnosis not present

## 2016-03-17 DIAGNOSIS — E1151 Type 2 diabetes mellitus with diabetic peripheral angiopathy without gangrene: Secondary | ICD-10-CM | POA: Diagnosis not present

## 2016-03-17 DIAGNOSIS — J439 Emphysema, unspecified: Secondary | ICD-10-CM | POA: Diagnosis not present

## 2016-03-18 ENCOUNTER — Emergency Department: Payer: Commercial Managed Care - HMO

## 2016-03-18 ENCOUNTER — Emergency Department
Admission: EM | Admit: 2016-03-18 | Discharge: 2016-03-19 | Disposition: A | Discharge status: Home / Self Care | Payer: Commercial Managed Care - HMO | Attending: Emergency Medicine | Admitting: Emergency Medicine

## 2016-03-18 DIAGNOSIS — I110 Hypertensive heart disease with heart failure: Secondary | ICD-10-CM | POA: Insufficient documentation

## 2016-03-18 DIAGNOSIS — Z79899 Other long term (current) drug therapy: Secondary | ICD-10-CM | POA: Diagnosis not present

## 2016-03-18 DIAGNOSIS — E785 Hyperlipidemia, unspecified: Secondary | ICD-10-CM | POA: Insufficient documentation

## 2016-03-18 DIAGNOSIS — I2510 Atherosclerotic heart disease of native coronary artery without angina pectoris: Secondary | ICD-10-CM | POA: Insufficient documentation

## 2016-03-18 DIAGNOSIS — Z87891 Personal history of nicotine dependence: Secondary | ICD-10-CM | POA: Diagnosis not present

## 2016-03-18 DIAGNOSIS — I480 Paroxysmal atrial fibrillation: Secondary | ICD-10-CM | POA: Insufficient documentation

## 2016-03-18 DIAGNOSIS — J45909 Unspecified asthma, uncomplicated: Secondary | ICD-10-CM | POA: Insufficient documentation

## 2016-03-18 DIAGNOSIS — J449 Chronic obstructive pulmonary disease, unspecified: Secondary | ICD-10-CM | POA: Insufficient documentation

## 2016-03-18 DIAGNOSIS — E119 Type 2 diabetes mellitus without complications: Secondary | ICD-10-CM | POA: Insufficient documentation

## 2016-03-18 DIAGNOSIS — I509 Heart failure, unspecified: Secondary | ICD-10-CM

## 2016-03-18 DIAGNOSIS — I5032 Chronic diastolic (congestive) heart failure: Secondary | ICD-10-CM | POA: Diagnosis not present

## 2016-03-18 DIAGNOSIS — R0789 Other chest pain: Secondary | ICD-10-CM | POA: Insufficient documentation

## 2016-03-18 DIAGNOSIS — R079 Chest pain, unspecified: Secondary | ICD-10-CM

## 2016-03-18 DIAGNOSIS — R0602 Shortness of breath: Secondary | ICD-10-CM | POA: Diagnosis not present

## 2016-03-18 DIAGNOSIS — Z7984 Long term (current) use of oral hypoglycemic drugs: Secondary | ICD-10-CM | POA: Diagnosis not present

## 2016-03-18 LAB — CBC WITH DIFFERENTIAL/PLATELET
Basophils Absolute: 0.1 10*3/uL (ref 0–0.1)
Basophils Relative: 1 %
Eosinophils Absolute: 0.3 10*3/uL (ref 0–0.7)
Eosinophils Relative: 5 %
HCT: 33 % — ABNORMAL LOW (ref 40.0–52.0)
Hemoglobin: 10.9 g/dL — ABNORMAL LOW (ref 13.0–18.0)
Lymphocytes Relative: 24 %
Lymphs Abs: 1.5 10*3/uL (ref 1.0–3.6)
MCH: 25.8 pg — ABNORMAL LOW (ref 26.0–34.0)
MCHC: 33.1 g/dL (ref 32.0–36.0)
MCV: 78 fL — ABNORMAL LOW (ref 80.0–100.0)
Monocytes Absolute: 0.7 10*3/uL (ref 0.2–1.0)
Monocytes Relative: 11 %
Neutro Abs: 3.6 10*3/uL (ref 1.4–6.5)
Neutrophils Relative %: 59 %
Platelets: 243 10*3/uL (ref 150–440)
RBC: 4.23 MIL/uL — ABNORMAL LOW (ref 4.40–5.90)
RDW: 20.5 % — ABNORMAL HIGH (ref 11.5–14.5)
WBC: 6.1 10*3/uL (ref 3.8–10.6)

## 2016-03-18 NOTE — ED Notes (Addendum)
Pt states SOB with exertion that began 2 days ago, but became worse today. Pt has hx of CHF that has brought him into ED multiple times in the last month.  pain that begins at incision on chest. Pt states he takes 14m of lasix twice a day when he notices swelling. Pt is on 2L [place] at this time, states he is on this all the time. Denies smoking, quit 3 years ago. Pt has 3 stents, states he takes Plavix and Eloquis. Last stent placed 2016, last bypass 2002. Pt talking in complete sentences, no distress noted at this time. Pt states hx of COPD as well. Pt states chronic cough, "when something does come up it's kinda yellowish looking" Pt states he already took his two doses of lasix today and 3 nitro without relief.   Dr. SClearnce Hastenat bedside.

## 2016-03-18 NOTE — ED Notes (Signed)
Pt taken to xray via stretcher  

## 2016-03-18 NOTE — ED Notes (Signed)
REDS VEST READING= 31% CHEST RULER= 0.5  VEST FITTING TASKS: POSTURE= Sitting HEIGHT MARKER= T CENTER STRIP= S  COMMENTS:approved by [employer]

## 2016-03-18 NOTE — ED Provider Notes (Signed)
[facility] Emergency Department Provider Note   ____________________________________________  Time seen: Approximately 11:15PM  I have reviewed the triage vital signs and the nursing notes.   HISTORY  Chief Complaint Cough and Shortness of Breath    HPI Lawrence [name] is a 60 y.o. male with a history of CAD as well as CHF and COPD who is presenting to the emergency department with 2 days of worsening shortness of breath and chest pain. He says that he is short of breath even at rest but gets severely short of breath when he takes several steps. He says that he is also had an increased cough productive of brownish sputum. He says that he has not smoked in 3 years. Multiple ER visits recently for CHF as well as one episode requiring antibiotics for presumed pulmonary infection. Says that he has been taking 80 mg of Lasix twice a day for the past 2 days without any relief. Has had relief of shortness of breath with 80 mg IV Lasix at [facility] on April 15. He says he is compliant with both Plavix and eliquis. Says that the chest pain is to the lower chest and radiating to his left shoulder. Says that the pain is a 9 out of 10 and pressure-like. Says that this pain is typical for when he has his CHF exacerbations. Says that he has tried 3 nitroglycerin tabs at home without any relief.   Past Medical History  Diagnosis Date  . CAD (coronary artery disease)     a. 2002 CABGx2 (LIMA->LAD, VG->VG->OM1);  b. 09/2012 DES->OM;  c. 03/2015 PTCA of LAD [facility]) in setting of atretic LIMA; d. 05/2015 Cath [facility]): nonobs dzs; e. 06/2015 Cath ([facility]): LM nl, LAD 45p/d ISR, 50d, D1/2 small, LCX 50p/d ISR, OM1 70ost, 30 ISR, VG->OM1 50ost, 98m LIMA->LAD 99p/d - atretic, RCA dom, nl; f.cath 10/16: 40-50%(FFR 0.90) pLAD, 75% (FFR 0.77) mLAD s/p PCI/DES, oRCA 40% (FFR0.95)  . Essential hypertension   . COPD (chronic obstructive pulmonary disease) ([place]     a. Chronic bronchitis and  emphysema.  . Type II diabetes mellitus ([place]   . Asthma   . Celiac disease   . Chronic diastolic CHF (congestive heart failure) ([facility]     a. 06/2009 Echo: EF 60-65%, Gr 1 DD, triv AI, mildly dil LA, nl RV.  .Marland KitchenAnxiety   . History of tobacco abuse     a. Quit 2014.  .Marland KitchenPSVT (paroxysmal supraventricular tachycardia) ([place]     a. 10/2012 Noted on [facility] Patch.  . Dysrhythmia   . A-fib ([facility]     Patient Active Problem List   Diagnosis Date Noted  . Paroxysmal atrial fibrillation ([place] 12/23/2015  . OSA (obstructive sleep apnea) 12/10/2015  . Left inguinal hernia 11/07/2015  . Acute anxiety 11/07/2015  . Unstable angina ([facility] 10/05/2015  . PAF (paroxysmal atrial fibrillation) ([place] 10/05/2015  . Back pain with left-sided radiculopathy 09/30/2015  . Nocturnal hypoxia 09/06/2015  . BPH (benign prostatic hyperplasia) 08/01/2015  . Chronic diastolic CHF (congestive heart failure) ([facility]   . Angina at rest ([facility]   . Chest pain 07/11/2015  . COPD (chronic obstructive pulmonary disease) ([facility] 07/03/2015  . CAD (coronary artery disease) 06/26/2015  . HTN (hypertension) 06/26/2015  . DM (diabetes mellitus) ([place] 06/26/2015  . Achalasia 07/24/2014  . GERD (gastroesophageal reflux disease) 06/07/2014  . Former tobacco use 04/11/2013  . Chest pain at rest 04/09/2013  . HLD (hyperlipidemia) 04/09/2013  Past Surgical History  Procedure Laterality Date  . Vascular surgery    . Bypass graft    . Tonsillectomy    . Cholecystectomy    . Cardiac catheterization N/A 07/12/2015    rocedure: Left Heart Cath and Cors/Grafts Angiography;  Surgeon: Belva Crome, MD;  Location: [facility];  Service: Cardiovascular;  Laterality: N/A;  . Esophageal dilation    . Cardiac catheterization Right 10/07/2015    Procedure: Left Heart Cath and Cors/Grafts Angiography;  Surgeon: Dionisio David, MD;  Location: [facility];  Service: Cardiovascular;  Laterality: Right;    Current Outpatient Rx    Name  Route  Sig  Dispense  Refill  . acetaminophen (TYLENOL) 325 MG tablet   Oral   Take 2 tablets (650 mg total) by mouth every 6 (six) hours as needed for mild pain or headache.         . albuterol (PROVENTIL HFA;VENTOLIN HFA) 108 (90 Base) MCG/ACT inhaler      Inhale 4-6 puffs by mouth every 4 hours as needed for wheezing, cough, and/or shortness of breath   1 Inhaler   1   . ALPRAZolam (XANAX) 1 MG tablet   Oral   Take 0.5-1 tablets (0.5-1 mg total) by mouth 3 (three) times daily as needed for anxiety.   90 tablet   3   . amLODipine (NORVASC) 10 MG tablet   Oral   Take 10 mg by mouth daily.         Marland Kitchen atorvastatin (LIPITOR) 80 MG tablet   Oral   Take 1 tablet (80 mg total) by mouth at bedtime.   90 tablet   3   . cetirizine (ZYRTEC) 10 MG tablet   Oral   Take 10 mg by mouth at bedtime.         . citalopram (CELEXA) 20 MG tablet   Oral   Take 20 mg by mouth daily.         . clopidogrel (PLAVIX) 75 MG tablet   Oral   Take 1 tablet (75 mg total) by mouth daily.   30 tablet   5   . cyclobenzaprine (FLEXERIL) 10 MG tablet   Oral   Take 1 tablet (10 mg total) by mouth every 8 (eight) hours as needed for muscle spasms.   30 tablet   5   . ELIQUIS 5 MG TABS tablet      TAKE 1 TABLET BY MOUTH TWICE A DAY.   60 tablet   3   . isosorbide mononitrate (IMDUR) 60 MG 24 hr tablet   Oral   Take 120 mg by mouth daily.          . lansoprazole (PREVACID) 30 MG capsule   Oral   Take 30 mg by mouth daily at 12 noon.         . magnesium oxide (MAG-OX) 400 MG tablet   Oral   Take 400 mg by mouth.         . metFORMIN (GLUCOPHAGE) 500 MG tablet   Oral   Take 500 mg by mouth daily.         . mometasone-formoterol (DULERA) 200-5 MCG/ACT AERO   Inhalation   Inhale 1 puff into the lungs 2 (two) times daily.         . nitroGLYCERIN (NITROSTAT) 0.4 MG SL tablet   Sublingual   Place 0.4 mg under the tongue every 5 (five) minutes as needed for chest  pain.         . omega-3 acid ethyl esters (LOVAZA) 1 g capsule   Oral   Take 4 g by mouth daily.         Marland Kitchen oxyCODONE-acetaminophen (PERCOCET) 10-325 MG tablet   Oral   Take 1 tablet by mouth every 4 (four) hours as needed for pain.   150 tablet   0   . potassium chloride (K-DUR,KLOR-CON) 10 MEQ tablet   Oral   Take 10 mEq by mouth daily.         . ranolazine (RANEXA) 500 MG 12 hr tablet   Oral   Take 2 tablets (1,000 mg total) by mouth 2 (two) times daily.   120 tablet   0   . sotalol (BETAPACE) 120 MG tablet   Oral   Take 120 mg by mouth.         . SPIRIVA HANDIHALER 18 MCG inhalation capsule      INHALE ONE CAPSULE AS DIRECTED ONCE A DAY   30 capsule   5   . tamsulosin (FLOMAX) 0.4 MG CAPS capsule   Oral   Take 0.4 mg by mouth at bedtime.            Allergies Demerol; Prednisone; Sulfa antibiotics; Albuterol sulfate; and Morphine sulfate  Family History  Problem Relation Age of Onset  . Heart attack Mother   . Cirrhosis Mother   . Diabetes Mother   . Depression Mother   . Heart disease Mother   . Heart attack Father   . Diabetes Father   . Depression Father   . Heart disease Father   . Parkinson's disease Brother     Social History Social History  Substance Use Topics  . Smoking status: Former Smoker    Quit date: 04/22/2013  . Smokeless tobacco: Never Used  . Alcohol Use: No     Comment: remotely quit alcohol use. Hx of heavy alcohol use.    Review of Systems Constitutional: No fever/chills Eyes: No visual changes. ENT: No sore throat. Cardiovascular:As above Respiratory: As above Gastrointestinal: No abdominal pain.  No nausea, no vomiting.  No diarrhea.  No constipation. Genitourinary: Negative for dysuria. Musculoskeletal: Negative for back pain. Skin: Negative for rash. Neurological: Negative for headaches, focal weakness or numbness.  10-point ROS otherwise  negative.  ____________________________________________   PHYSICAL EXAM:  VITAL SIGNS: ED Triage Vitals  Enc Vitals Group     BP 03/18/16 2230 113/59 mmHg     Pulse Rate 03/18/16 2230 61     Resp 03/18/16 2230 20     Temp 03/18/16 2230 97.7 F (36.5 C)     Temp Source 03/18/16 2230 Oral     SpO2 03/18/16 2230 97 %     Weight 03/18/16 2230 241 lb (109.317 kg)     Height 03/18/16 2230 5' 7"  (1.702 m)     Head Cir --      Peak Flow --      Pain Score 03/18/16 2229 9     Pain Loc --      Pain Edu? --      Excl. in [place]? --     Constitutional: Alert and oriented. Well appearing and in no acute distress. Eyes: Conjunctivae are normal. PERRL. EOMI. Head: Atraumatic. Nose: No congestion/rhinnorhea. Wearing nasal cannula oxygen which he doesn't his baseline. Mouth/Throat: Mucous membranes are moist.  Oropharynx non-erythematous. Neck: No stridor.   Cardiovascular: Normal rate, regular rhythm. Grossly normal heart sounds.  Respiratory: Normal respiratory effort.  No retractions.  Scant, diffuse extra wheezes. Gastrointestinal: Soft and nontender. No distention Musculoskeletal: Mild bilateral lower extremity edema at the ankles.  No joint effusions. Neurologic:  Normal speech and language. No gross focal neurologic deficits are appreciated. Skin:  Skin is warm, dry and intact. No rash noted. Psychiatric: Mood and affect are normal. Speech and behavior are normal.  ____________________________________________   LABS (all labs ordered are listed, but only abnormal results are displayed)  Labs Reviewed  BASIC METABOLIC PANEL - Abnormal; Notable for the following:    Glucose, Bld 106 (*)    BUN 27 (*)    Creatinine, Ser 1.54 (*)    GFR calc non Af Amer 47 (*)    GFR calc Af Amer 54 (*)    All other components within normal limits  CBC WITH DIFFERENTIAL/PLATELET - Abnormal; Notable for the following:    RBC 4.23 (*)    Hemoglobin 10.9 (*)    HCT 33.0 (*)    MCV 78.0 (*)     MCH 25.8 (*)    RDW 20.5 (*)    All other components within normal limits  TROPONIN I  BRAIN NATRIURETIC PEPTIDE  TROPONIN I   ____________________________________________  EKG  ED ECG REPORT I, Doran Stabler, the attending physician, personally viewed and interpreted this ECG.   Date: 03/18/2016  EKG Time: 2324  Rate: 57  Rhythm: normal sinus rhythm  Axis: Normal axis  Intervals:none  ST&T Change: No ST segment elevation or depression. No abnormal T-wave inversion.  ____________________________________________  RADIOLOGY  DG Chest 2 View (Final result) Result time: 03/18/16 23:28:18   Final result by Rad Results In Interface (03/18/16 23:28:18)   Narrative:   CLINICAL DATA: Shortness of breath on exertion beginning 2 days ago, worse today. History of COPD on oxygen.  EXAM: CHEST 2 VIEW  COMPARISON: 4127 pain  FINDINGS: Postoperative changes in the mediastinum. Normal heart size and pulmonary vascularity. No focal airspace disease or consolidation in the lungs. No blunting of costophrenic angles. No pneumothorax. Mediastinal contours appear intact. Linear atelectasis or scarring in the left lung base. Degenerative changes in the spine.  IMPRESSION: Linear atelectasis or fibrosis in the left lung base. No evidence of active consolidation.   Electronically Signed By: Lucienne Capers M.D.    ____________________________________________   PROCEDURES   ____________________________________________   INITIAL IMPRESSION / ASSESSMENT AND PLAN / ED COURSE  Pertinent labs & imaging results that were available during my care of the patient were reviewed by me and considered in my medical decision making (see chart for details).  ----------------------------------------- 3:56 AM on 03/19/2016 -----------------------------------------  Patient resting comfortably at this time without any distress. Re-auscultated his lungs and there is no more  wheezing. He says that his shortness of breath is improved. He does say that he has had persistent chest pain. However, I reviewed his cardiology notes and he was seen in early April with 2 reassuring catheterizations, one within the last year. I also discussed case with Dr. Ubaldo Glassing of cardiology here who says that the patient's and appropriate for follow-up as an outpatient as long as he has 2 negative troponins. I discussed the plan with the patient says that he is feeling improved and that he will call his cardiologist first thing in the morning. He understands return precautions and will return to the emergency department for any worsening or concerning symptoms. Given the patient has had multiple recent emergency department visits with extensive workup  I feel that his reassuring lab work and EKG today make him appropriate for outpatient treatment. He also was not in extremis and has had improved symptoms with the treatment he is received in the emergency department. ____________________________________________   FINAL CLINICAL IMPRESSION(S) / ED DIAGNOSES  Chest pain. CHF exacerbation.    NEW MEDICATIONS STARTED DURING THIS VISIT:  New Prescriptions   No medications on file     Note:  This document was prepared using Dragon voice recognition software and may include unintentional dictation errors.    Orbie Pyo, MD 03/19/16 [phone]

## 2016-03-18 NOTE — ED Notes (Signed)
Pt to triage via w/c with no distress noted; reports SHOB on exertion, nonprod cough; O2 in place at 2l/min via [place]

## 2016-03-19 DIAGNOSIS — I5032 Chronic diastolic (congestive) heart failure: Secondary | ICD-10-CM | POA: Diagnosis not present

## 2016-03-19 LAB — BASIC METABOLIC PANEL
Anion gap: 5 (ref 5–15)
BUN: 27 mg/dL — ABNORMAL HIGH (ref 6–20)
CO2: 29 mmol/L (ref 22–32)
Calcium: 8.9 mg/dL (ref 8.9–10.3)
Chloride: 105 mmol/L (ref 101–111)
Creatinine, Ser: 1.54 mg/dL — ABNORMAL HIGH (ref 0.61–1.24)
GFR calc Af Amer: 54 mL/min — ABNORMAL LOW (ref >60)
GFR calc non Af Amer: 47 mL/min — ABNORMAL LOW (ref >60)
Glucose, Bld: 106 mg/dL — ABNORMAL HIGH (ref 65–99)
Potassium: 4 mmol/L (ref 3.5–5.1)
Sodium: 139 mmol/L (ref 135–145)

## 2016-03-19 LAB — TROPONIN I
Troponin I: <0.03 ng/mL (ref <0.031)
Troponin I: <0.03 ng/mL (ref <0.031)

## 2016-03-19 LAB — BRAIN NATRIURETIC PEPTIDE: B Natriuretic Peptide: 49 pg/mL (ref 0.0–100.0)

## 2016-03-19 MED ORDER — FUROSEMIDE 10 MG/ML IJ SOLN
80.0000 mg | Freq: Once | INTRAMUSCULAR | Status: AC
  Administered 2016-03-19: 80 mg via INTRAVENOUS
  Filled 2016-03-19: qty 8

## 2016-03-19 MED ORDER — MORPHINE SULFATE (PF) 4 MG/ML IV SOLN
4.0000 mg | Freq: Once | INTRAVENOUS | Status: AC
  Administered 2016-03-19: 4 mg via INTRAVENOUS
  Filled 2016-03-19: qty 1

## 2016-03-19 MED ORDER — ONDANSETRON HCL 4 MG/2ML IJ SOLN
4.0000 mg | Freq: Once | INTRAMUSCULAR | Status: AC
  Administered 2016-03-19: 4 mg via INTRAVENOUS
  Filled 2016-03-19: qty 2

## 2016-03-19 NOTE — ED Notes (Signed)
Pt states he is allergic to the morphine pill, makes him itch, but he has had morphine IV and he does not react to it.

## 2016-03-19 NOTE — ED Notes (Signed)
Dr. Diannia Ruder at bedside.

## 2016-03-19 NOTE — ED Notes (Signed)
Dr. Clearnce Hasten at bedside.

## 2016-03-20 ENCOUNTER — Other Ambulatory Visit: Payer: Self-pay | Admitting: Family Medicine

## 2016-03-20 DIAGNOSIS — M5410 Radiculopathy, site unspecified: Secondary | ICD-10-CM

## 2016-03-20 NOTE — Telephone Encounter (Signed)
Pt contacted office for refill request on the following medications: oxyCODONE-acetaminophen (PERCOCET) 10-325 MG tablet. Please advise. Thanks TNP

## 2016-03-21 MED ORDER — OXYCODONE-ACETAMINOPHEN 10-325 MG PO TABS: 1.0000 | ORAL_TABLET | ORAL | Status: DC | PRN

## 2016-03-23 ENCOUNTER — Other Ambulatory Visit: Payer: Self-pay | Admitting: *Deleted

## 2016-03-23 NOTE — Patient Outreach (Signed)
[facility]) Care Management  03/23/2016  Lawrence [name] 19-Aug-1956 [id number]   Subjective: Telephone call to patient's home number, no answer, left HIPAA compliant voice mail message, and requested call back.   Objective: Per chart review: Patient has ED visit on 03/18/16 for chest pain.   Patient was hospitalized 10/05/15 - 10/07/15 for atrial fibrillation.  Patient has a history of CAD, COPD, diabetes, chronic diastolic congestive heart failure, and hypertension.    Assessment: Received [facility] referral on 03/20/16.   Referral source: Laurene Footman.   Referral reason: High ED utilization.  Patient has had 22 ED visits since May of 2016.  Multiple recent ED visits related to congestive heart failure.  Most recent ED visit with congestive heart failure exacerbation.  Past medical history: CAD, hypertension, COPD, diabetes mellitus type 2, asthma, celiac disease, chronic diastolic congestive heart failure, anxiety, atrial fibrillation, and tobacco abuse (quit smoking in 2014).   Patient would benefit from ongoing disease management.  Observation admit at [facility] with discharge on 02/13/16.   ED utilization = 10 visits in 6 months.  Services requested: [place] and Pharmacist.    Telephone screen completion, pending patient contact.  Plan: RNCM will call patient within 1 week for 2nd attempt telephone outreach, telephone screen, if no return call from patient.   Lawrence [name], BSN, [place] [facility] Telephonic CM Phone: [phone] Fax: [phone]

## 2016-03-24 ENCOUNTER — Other Ambulatory Visit: Payer: Self-pay | Admitting: *Deleted

## 2016-03-24 DIAGNOSIS — J441 Chronic obstructive pulmonary disease with (acute) exacerbation: Secondary | ICD-10-CM

## 2016-03-24 NOTE — Patient Outreach (Addendum)
[facility]) Care Management  03/24/2016  Lawrence [name] Jul 27, 1956 [id number]  Subjective: Telephone call to patient's home number, spoke with patient, and HIPAA verified.   Discussed [facility] Care Management services and patient in agreement to receive services. Patient gave [facility] verbal authorization to speak with wife [name]) and son [name]) regarding his healthcare needs as needed.  States he is doing okay and hanging in there.   Patient states he does not have any pharmacy and transportation needs at this time.  States he does not have any questions for [place] because he has a pharmacist at this local pharmacy who answers his questions as needed.   Patient states he has the following equipment that he uses as needed: standard walker and straight cane. States he has home oxygen at 2 liters that he wears all the time and uses CPAP at night.   Patient states he is in need of COPD disease education, COPD disease monitoring, Congestive Heart Failure disease education,  Congestive Heart Failure disease monitoring, care coordination, and community resources.  Patient states he will be moving to another place in [place] within the next couple of weeks and can be reached on his home number listed in [place], which is really his cell phone.   Patient states he does not have a land line home phone.   Patient will continue to receive [place] Management services.   Objective: Per chart review: Patient has ED visit on 03/18/16 for chest pain. Patient was hospitalized 10/05/15 - 10/07/15 for atrial fibrillation. Patient has a history of CAD, COPD, diabetes, chronic diastolic congestive heart failure, and hypertension.    Assessment: Received [facility] referral on 03/20/16. Referral source: Laurene Footman. Referral reason: High ED utilization. Patient has had 22 ED visits since May of 2016. Multiple recent ED visits related to  congestive heart failure. Most recent ED visit with congestive heart failure exacerbation. Past medical history: CAD, hypertension, COPD, diabetes mellitus type 2, asthma, celiac disease, chronic diastolic congestive heart failure, anxiety, atrial fibrillation, and tobacco abuse (quit smoking in 2014). Patient would benefit from ongoing disease management.  Observation admit at [facility] with discharge on 02/13/16. ED utilization = 10 visits in 6 months. Services requested: [place] and Pharmacist. Telephone screen completed and patient will receive [place] Management services.  Patient has no further Telephonic RNCM needs at this time.   Plan: RNCM will refer patient to [facility] for COPD disease education, COPD disease monitoring, Congestive Heart Failure disease education,  Congestive Heart Failure disease monitoring, care coordination, and community resources.  Merelin Human H. Annia Friendly, BSN, [place] [facility] Telephonic CM Phone: [phone] Fax: [phone]

## 2016-03-26 ENCOUNTER — Encounter: Payer: Self-pay | Admitting: Emergency Medicine

## 2016-03-26 ENCOUNTER — Observation Stay
Admission: EM | Admit: 2016-03-26 | Discharge: 2016-03-28 | Disposition: A | Discharge status: Home / Self Care | Payer: Commercial Managed Care - HMO | Attending: Internal Medicine | Admitting: Internal Medicine

## 2016-03-26 ENCOUNTER — Emergency Department: Payer: Commercial Managed Care - HMO

## 2016-03-26 ENCOUNTER — Other Ambulatory Visit: Payer: Self-pay | Admitting: Family Medicine

## 2016-03-26 DIAGNOSIS — R0902 Hypoxemia: Secondary | ICD-10-CM | POA: Insufficient documentation

## 2016-03-26 DIAGNOSIS — R079 Chest pain, unspecified: Secondary | ICD-10-CM | POA: Insufficient documentation

## 2016-03-26 DIAGNOSIS — Z9981 Dependence on supplemental oxygen: Secondary | ICD-10-CM | POA: Diagnosis not present

## 2016-03-26 DIAGNOSIS — Z794 Long term (current) use of insulin: Secondary | ICD-10-CM | POA: Diagnosis not present

## 2016-03-26 DIAGNOSIS — K219 Gastro-esophageal reflux disease without esophagitis: Secondary | ICD-10-CM | POA: Diagnosis not present

## 2016-03-26 DIAGNOSIS — Z833 Family history of diabetes mellitus: Secondary | ICD-10-CM | POA: Insufficient documentation

## 2016-03-26 DIAGNOSIS — Z87891 Personal history of nicotine dependence: Secondary | ICD-10-CM | POA: Diagnosis not present

## 2016-03-26 DIAGNOSIS — K900 Celiac disease: Secondary | ICD-10-CM | POA: Insufficient documentation

## 2016-03-26 DIAGNOSIS — R0602 Shortness of breath: Secondary | ICD-10-CM | POA: Diagnosis not present

## 2016-03-26 DIAGNOSIS — K220 Achalasia of cardia: Secondary | ICD-10-CM | POA: Insufficient documentation

## 2016-03-26 DIAGNOSIS — M7989 Other specified soft tissue disorders: Secondary | ICD-10-CM | POA: Diagnosis not present

## 2016-03-26 DIAGNOSIS — N400 Enlarged prostate without lower urinary tract symptoms: Secondary | ICD-10-CM | POA: Insufficient documentation

## 2016-03-26 DIAGNOSIS — I110 Hypertensive heart disease with heart failure: Secondary | ICD-10-CM | POA: Insufficient documentation

## 2016-03-26 DIAGNOSIS — I5022 Chronic systolic (congestive) heart failure: Secondary | ICD-10-CM | POA: Diagnosis present

## 2016-03-26 DIAGNOSIS — Z8249 Family history of ischemic heart disease and other diseases of the circulatory system: Secondary | ICD-10-CM | POA: Diagnosis not present

## 2016-03-26 DIAGNOSIS — J449 Chronic obstructive pulmonary disease, unspecified: Secondary | ICD-10-CM | POA: Diagnosis present

## 2016-03-26 DIAGNOSIS — G4733 Obstructive sleep apnea (adult) (pediatric): Secondary | ICD-10-CM | POA: Diagnosis not present

## 2016-03-26 DIAGNOSIS — I071 Rheumatic tricuspid insufficiency: Secondary | ICD-10-CM | POA: Diagnosis not present

## 2016-03-26 DIAGNOSIS — Z7901 Long term (current) use of anticoagulants: Secondary | ICD-10-CM | POA: Diagnosis not present

## 2016-03-26 DIAGNOSIS — E782 Mixed hyperlipidemia: Secondary | ICD-10-CM | POA: Diagnosis present

## 2016-03-26 DIAGNOSIS — R002 Palpitations: Secondary | ICD-10-CM | POA: Insufficient documentation

## 2016-03-26 DIAGNOSIS — Z955 Presence of coronary angioplasty implant and graft: Secondary | ICD-10-CM | POA: Diagnosis not present

## 2016-03-26 DIAGNOSIS — I480 Paroxysmal atrial fibrillation: Secondary | ICD-10-CM | POA: Insufficient documentation

## 2016-03-26 DIAGNOSIS — K4090 Unilateral inguinal hernia, without obstruction or gangrene, not specified as recurrent: Secondary | ICD-10-CM | POA: Diagnosis not present

## 2016-03-26 DIAGNOSIS — I471 Supraventricular tachycardia: Secondary | ICD-10-CM | POA: Insufficient documentation

## 2016-03-26 DIAGNOSIS — M549 Dorsalgia, unspecified: Secondary | ICD-10-CM | POA: Insufficient documentation

## 2016-03-26 DIAGNOSIS — Z882 Allergy status to sulfonamides status: Secondary | ICD-10-CM | POA: Diagnosis not present

## 2016-03-26 DIAGNOSIS — I2510 Atherosclerotic heart disease of native coronary artery without angina pectoris: Secondary | ICD-10-CM | POA: Diagnosis present

## 2016-03-26 DIAGNOSIS — I509 Heart failure, unspecified: Secondary | ICD-10-CM

## 2016-03-26 DIAGNOSIS — I25110 Atherosclerotic heart disease of native coronary artery with unstable angina pectoris: Principal | ICD-10-CM | POA: Insufficient documentation

## 2016-03-26 DIAGNOSIS — I5023 Acute on chronic systolic (congestive) heart failure: Secondary | ICD-10-CM | POA: Diagnosis present

## 2016-03-26 DIAGNOSIS — F418 Other specified anxiety disorders: Secondary | ICD-10-CM | POA: Diagnosis present

## 2016-03-26 DIAGNOSIS — R0789 Other chest pain: Secondary | ICD-10-CM | POA: Diagnosis not present

## 2016-03-26 DIAGNOSIS — E1121 Type 2 diabetes mellitus with diabetic nephropathy: Secondary | ICD-10-CM

## 2016-03-26 DIAGNOSIS — F419 Anxiety disorder, unspecified: Secondary | ICD-10-CM | POA: Diagnosis not present

## 2016-03-26 DIAGNOSIS — I5032 Chronic diastolic (congestive) heart failure: Secondary | ICD-10-CM | POA: Diagnosis not present

## 2016-03-26 DIAGNOSIS — Z7902 Long term (current) use of antithrombotics/antiplatelets: Secondary | ICD-10-CM | POA: Insufficient documentation

## 2016-03-26 DIAGNOSIS — Z885 Allergy status to narcotic agent status: Secondary | ICD-10-CM | POA: Insufficient documentation

## 2016-03-26 DIAGNOSIS — Z951 Presence of aortocoronary bypass graft: Secondary | ICD-10-CM | POA: Insufficient documentation

## 2016-03-26 DIAGNOSIS — Z820 Family history of epilepsy and other diseases of the nervous system: Secondary | ICD-10-CM | POA: Diagnosis not present

## 2016-03-26 DIAGNOSIS — Z888 Allergy status to other drugs, medicaments and biological substances status: Secondary | ICD-10-CM | POA: Insufficient documentation

## 2016-03-26 DIAGNOSIS — I200 Unstable angina: Secondary | ICD-10-CM | POA: Diagnosis present

## 2016-03-26 DIAGNOSIS — I10 Essential (primary) hypertension: Secondary | ICD-10-CM | POA: Diagnosis not present

## 2016-03-26 DIAGNOSIS — Z818 Family history of other mental and behavioral disorders: Secondary | ICD-10-CM | POA: Insufficient documentation

## 2016-03-26 DIAGNOSIS — E119 Type 2 diabetes mellitus without complications: Secondary | ICD-10-CM | POA: Diagnosis not present

## 2016-03-26 DIAGNOSIS — E785 Hyperlipidemia, unspecified: Secondary | ICD-10-CM | POA: Diagnosis not present

## 2016-03-26 DIAGNOSIS — I25708 Atherosclerosis of coronary artery bypass graft(s), unspecified, with other forms of angina pectoris: Secondary | ICD-10-CM | POA: Diagnosis not present

## 2016-03-26 DIAGNOSIS — J9611 Chronic respiratory failure with hypoxia: Secondary | ICD-10-CM | POA: Diagnosis not present

## 2016-03-26 LAB — CBC
HCT: 32.6 % — ABNORMAL LOW (ref 40.0–52.0)
Hemoglobin: 10.6 g/dL — ABNORMAL LOW (ref 13.0–18.0)
MCH: 25.1 pg — ABNORMAL LOW (ref 26.0–34.0)
MCHC: 32.4 g/dL (ref 32.0–36.0)
MCV: 77.6 fL — ABNORMAL LOW (ref 80.0–100.0)
Platelets: 243 10*3/uL (ref 150–440)
RBC: 4.21 MIL/uL — ABNORMAL LOW (ref 4.40–5.90)
RDW: 20.5 % — ABNORMAL HIGH (ref 11.5–14.5)
WBC: 5.6 10*3/uL (ref 3.8–10.6)

## 2016-03-26 LAB — BASIC METABOLIC PANEL
Anion gap: 8 (ref 5–15)
BUN: 28 mg/dL — ABNORMAL HIGH (ref 6–20)
CO2: 27 mmol/L (ref 22–32)
Calcium: 9 mg/dL (ref 8.9–10.3)
Chloride: 102 mmol/L (ref 101–111)
Creatinine, Ser: 1.78 mg/dL — ABNORMAL HIGH (ref 0.61–1.24)
GFR calc Af Amer: 45 mL/min — ABNORMAL LOW (ref >60)
GFR calc non Af Amer: 39 mL/min — ABNORMAL LOW (ref >60)
Glucose, Bld: 145 mg/dL — ABNORMAL HIGH (ref 65–99)
Potassium: 4.4 mmol/L (ref 3.5–5.1)
Sodium: 137 mmol/L (ref 135–145)

## 2016-03-26 LAB — GLUCOSE, CAPILLARY: Glucose-Capillary: 96 mg/dL (ref 65–99)

## 2016-03-26 LAB — TROPONIN I
Troponin I: <0.03 ng/mL (ref <0.031)
Troponin I: <0.03 ng/mL (ref <0.031)

## 2016-03-26 MED ORDER — CLOPIDOGREL BISULFATE 75 MG PO TABS
75.0000 mg | ORAL_TABLET | Freq: Every day | ORAL | Status: DC
  Administered 2016-03-27 – 2016-03-28 (×2): 75 mg via ORAL
  Filled 2016-03-26 (×2): qty 1

## 2016-03-26 MED ORDER — ALPRAZOLAM 0.25 MG PO TABS
0.5000 mg | ORAL_TABLET | Freq: Three times a day (TID) | ORAL | Status: DC | PRN
  Administered 2016-03-26: 0.5 mg via ORAL
  Administered 2016-03-27: 0.25 mg via ORAL
  Filled 2016-03-26: qty 1
  Filled 2016-03-26: qty 2

## 2016-03-26 MED ORDER — APIXABAN 5 MG PO TABS
5.0000 mg | ORAL_TABLET | Freq: Two times a day (BID) | ORAL | Status: DC
  Administered 2016-03-26 – 2016-03-28 (×4): 5 mg via ORAL
  Filled 2016-03-26 (×4): qty 1

## 2016-03-26 MED ORDER — FUROSEMIDE 40 MG PO TABS
40.0000 mg | ORAL_TABLET | Freq: Two times a day (BID) | ORAL | Status: DC
  Administered 2016-03-27 – 2016-03-28 (×3): 40 mg via ORAL
  Filled 2016-03-26 (×3): qty 1

## 2016-03-26 MED ORDER — MORPHINE SULFATE (PF) 4 MG/ML IV SOLN
INTRAVENOUS | Status: AC
  Administered 2016-03-26: 4 mg via INTRAVENOUS
  Filled 2016-03-26: qty 1

## 2016-03-26 MED ORDER — MOMETASONE FURO-FORMOTEROL FUM 200-5 MCG/ACT IN AERO
1.0000 | INHALATION_SPRAY | Freq: Two times a day (BID) | RESPIRATORY_TRACT | Status: DC
  Administered 2016-03-26 – 2016-03-28 (×4): 1 via RESPIRATORY_TRACT
  Filled 2016-03-26: qty 8.8

## 2016-03-26 MED ORDER — ACETAMINOPHEN 650 MG RE SUPP: 650.0000 mg | Freq: Four times a day (QID) | RECTAL | Status: DC | PRN

## 2016-03-26 MED ORDER — NITROGLYCERIN 0.4 MG SL SUBL
0.4000 mg | SUBLINGUAL_TABLET | SUBLINGUAL | Status: DC | PRN
  Administered 2016-03-26: 0.4 mg via SUBLINGUAL
  Filled 2016-03-26: qty 1

## 2016-03-26 MED ORDER — RANOLAZINE ER 500 MG PO TB12
1000.0000 mg | ORAL_TABLET | Freq: Two times a day (BID) | ORAL | Status: DC
  Administered 2016-03-27 – 2016-03-28 (×3): 1000 mg via ORAL
  Filled 2016-03-26 (×3): qty 2

## 2016-03-26 MED ORDER — INSULIN ASPART 100 UNIT/ML ~~LOC~~ SOLN
0.0000 [IU] | Freq: Three times a day (TID) | SUBCUTANEOUS | Status: DC
  Administered 2016-03-27: 1 [IU] via SUBCUTANEOUS
  Filled 2016-03-26: qty 1

## 2016-03-26 MED ORDER — PANTOPRAZOLE SODIUM 20 MG PO TBEC
20.0000 mg | DELAYED_RELEASE_TABLET | Freq: Every day | ORAL | Status: DC
  Filled 2016-03-26 (×2): qty 1

## 2016-03-26 MED ORDER — ONDANSETRON HCL 4 MG/2ML IJ SOLN
4.0000 mg | Freq: Four times a day (QID) | INTRAMUSCULAR | Status: DC | PRN
  Administered 2016-03-26 – 2016-03-27 (×4): 4 mg via INTRAVENOUS
  Filled 2016-03-26 (×4): qty 2

## 2016-03-26 MED ORDER — SODIUM CHLORIDE 0.9% FLUSH
3.0000 mL | Freq: Two times a day (BID) | INTRAVENOUS | Status: DC
  Administered 2016-03-26 – 2016-03-28 (×4): 3 mL via INTRAVENOUS

## 2016-03-26 MED ORDER — CITALOPRAM HYDROBROMIDE 20 MG PO TABS
20.0000 mg | ORAL_TABLET | Freq: Every day | ORAL | Status: DC
  Administered 2016-03-27 – 2016-03-28 (×2): 20 mg via ORAL
  Filled 2016-03-26 (×2): qty 1

## 2016-03-26 MED ORDER — SOTALOL HCL 120 MG PO TABS
120.0000 mg | ORAL_TABLET | Freq: Two times a day (BID) | ORAL | Status: DC
  Administered 2016-03-26 – 2016-03-28 (×4): 120 mg via ORAL
  Filled 2016-03-26 (×5): qty 1

## 2016-03-26 MED ORDER — INSULIN ASPART 100 UNIT/ML ~~LOC~~ SOLN: 0.0000 [IU] | Freq: Every day | SUBCUTANEOUS | Status: DC

## 2016-03-26 MED ORDER — ATORVASTATIN CALCIUM 20 MG PO TABS
80.0000 mg | ORAL_TABLET | Freq: Every day | ORAL | Status: DC
  Administered 2016-03-26 – 2016-03-27 (×2): 80 mg via ORAL
  Filled 2016-03-26 (×2): qty 4

## 2016-03-26 MED ORDER — ACETAMINOPHEN 325 MG PO TABS: 650.0000 mg | ORAL_TABLET | Freq: Four times a day (QID) | ORAL | Status: DC | PRN

## 2016-03-26 MED ORDER — MORPHINE SULFATE (PF) 2 MG/ML IV SOLN
2.0000 mg | INTRAVENOUS | Status: DC | PRN
  Administered 2016-03-27 (×3): 2 mg via INTRAVENOUS
  Filled 2016-03-26 (×3): qty 1

## 2016-03-26 MED ORDER — ONDANSETRON HCL 4 MG PO TABS
4.0000 mg | ORAL_TABLET | Freq: Four times a day (QID) | ORAL | Status: DC | PRN
  Administered 2016-03-28 (×2): 4 mg via ORAL
  Filled 2016-03-26 (×2): qty 1

## 2016-03-26 MED ORDER — TIOTROPIUM BROMIDE MONOHYDRATE 18 MCG IN CAPS
18.0000 ug | ORAL_CAPSULE | Freq: Every day | RESPIRATORY_TRACT | Status: DC
  Administered 2016-03-27 – 2016-03-28 (×2): 18 ug via RESPIRATORY_TRACT
  Filled 2016-03-26: qty 5

## 2016-03-26 MED ORDER — MORPHINE SULFATE (PF) 2 MG/ML IV SOLN
2.0000 mg | Freq: Once | INTRAVENOUS | Status: AC
  Administered 2016-03-26: 2 mg via INTRAVENOUS
  Filled 2016-03-26: qty 1

## 2016-03-26 MED ORDER — MORPHINE SULFATE (PF) 4 MG/ML IV SOLN
4.0000 mg | Freq: Once | INTRAVENOUS | Status: AC
  Administered 2016-03-26: 4 mg via INTRAVENOUS

## 2016-03-26 MED ORDER — ISOSORBIDE MONONITRATE ER 60 MG PO TB24
120.0000 mg | ORAL_TABLET | Freq: Every day | ORAL | Status: DC
  Administered 2016-03-27 – 2016-03-28 (×2): 120 mg via ORAL
  Filled 2016-03-26 (×2): qty 2

## 2016-03-26 MED ORDER — ONDANSETRON HCL 4 MG/2ML IJ SOLN
4.0000 mg | Freq: Once | INTRAMUSCULAR | Status: AC
  Administered 2016-03-26: 4 mg via INTRAVENOUS
  Filled 2016-03-26: qty 2

## 2016-03-26 MED ORDER — TAMSULOSIN HCL 0.4 MG PO CAPS
0.4000 mg | ORAL_CAPSULE | Freq: Every day | ORAL | Status: DC
  Administered 2016-03-27: 0.4 mg via ORAL
  Filled 2016-03-26: qty 1

## 2016-03-26 NOTE — ED Notes (Signed)
Hospitalist at bedside 

## 2016-03-26 NOTE — ED Notes (Signed)
Attempt to call report nurse unable to take report.

## 2016-03-26 NOTE — ED Notes (Signed)
Pt noted to be on 2L [place] everyday continuous

## 2016-03-26 NOTE — H&P (Signed)
[facility] NAME: Lawrence [name]    MR#:  [id number]  DATE OF BIRTH:  Mar 13, 1956  DATE OF ADMISSION:  03/26/2016  PRIMARY CARE PHYSICIAN: [name], MD   REQUESTING/REFERRING PHYSICIAN: Corky Downs, MD  CHIEF COMPLAINT:   Chief Complaint  Patient presents with  . Chest Pain    HISTORY OF PRESENT ILLNESS:  Lawrence [name]  is a 60 y.o. male who presents with Progressive shortness of breath with significant increasing dyspnea on exertion especially, and increase chest pain today. Patient has a very extensive cardiac history, including known CAD with stents, CHF, paroxysmal A. fib. He normally follows with cardiologist in [facility], however he is recently explored the possibility of following with Dr. [name] here. He comes in today with the above symptoms for evaluation. Workup in the ED is largely negative upfront, but given his cardiac history, hospitalists were called for further evaluation and admission.  PAST MEDICAL HISTORY:   Past Medical History  Diagnosis Date  . CAD (coronary artery disease)     a. 2002 CABGx2 (LIMA->LAD, VG->VG->OM1);  b. 09/2012 DES->OM;  c. 03/2015 PTCA of LAD [facility]) in setting of atretic LIMA; d. 05/2015 Cath [facility]): nonobs dzs; e. 06/2015 Cath ([facility]): LM nl, LAD 45p/d ISR, 50d, D1/2 small, LCX 50p/d ISR, OM1 70ost, 30 ISR, VG->OM1 50ost, 84m LIMA->LAD 99p/d - atretic, RCA dom, nl; f.cath 10/16: 40-50%(FFR 0.90) pLAD, 75% (FFR 0.77) mLAD s/p PCI/DES, oRCA 40% (FFR0.95)  . Essential hypertension   . COPD (chronic obstructive pulmonary disease) ([place]     a. Chronic bronchitis and emphysema.  . Type II diabetes mellitus ([place]   . Asthma   . Celiac disease   . Chronic diastolic CHF (congestive heart failure) ([place]     a. 06/2009 Echo: EF 60-65%, Gr 1 DD, triv AI, mildly dil LA, nl RV.  .Marland KitchenAnxiety   . History of tobacco abuse     a. Quit 2014.  .Marland KitchenPSVT (paroxysmal supraventricular tachycardia) ([place]      a. 10/2012 Noted on Zio Patch.  . Dysrhythmia   . A-fib ([facility]     PAST SURGICAL HISTORY:   Past Surgical History  Procedure Laterality Date  . Vascular surgery    . Bypass graft    . Tonsillectomy    . Cholecystectomy    . Cardiac catheterization N/A 07/12/2015    rocedure: Left Heart Cath and Cors/Grafts Angiography;  Surgeon: HBelva Crome MD;  [facility];  Service: Cardiovascular;  Laterality: N/A;  . Esophageal dilation    . Cardiac catheterization Right 10/07/2015    Procedure: Left Heart Cath and Cors/Grafts Angiography;  Surgeon: SDionisio  MD;  [facility];  Service: Cardiovascular;  Laterality: Right;    SOCIAL HISTORY:   Social History  Substance Use Topics  . Smoking status: Former Smoker    Quit date: 04/22/2013  . Smokeless tobacco: Never Used  . Alcohol Use: No     Comment: remotely quit alcohol use. Hx of heavy alcohol use.    FAMILY HISTORY:   Family History  Problem Relation Age of Onset  . Heart attack Mother   . Cirrhosis Mother   . Diabetes Mother   . Depression Mother   . Heart disease Mother   . Heart attack Father   . Diabetes Father   . Depression Father   . Heart disease Father   . Parkinson's disease Brother  DRUG ALLERGIES:   Allergies  Allergen Reactions  . Demerol [Meperidine] Hives  . Prednisone Hypertension    Reaction: pt states it puts him in afib.  . Sulfa Antibiotics Hives  . Albuterol Sulfate [Albuterol] Palpitations and Other (See Comments)    Pt still currently uses this medication.    . Morphine Sulfate Nausea And Vomiting, Rash and Other (See Comments)    Pt states that he is only allergic to the tablet form of this medication.      MEDICATIONS AT HOME:   Prior to Admission medications   Medication Sig Start Date End Date Taking? Authorizing Provider  acetaminophen (TYLENOL) 325 MG tablet Take 2 tablets (650 mg total) by mouth every 6 (six) hours as needed for mild  pain or headache. 10/07/15   Nicholes Mango, MD  albuterol (PROVENTIL HFA;VENTOLIN HFA) 108 (90 Base) MCG/ACT inhaler Inhale 4-6 puffs by mouth every 4 hours as needed for wheezing, cough, and/or shortness of breath 01/07/16   Hinda Kehr, MD  ALPRAZolam Duanne Moron) 1 MG tablet Take 0.5-1 tablets (0.5-1 mg total) by mouth 3 (three) times daily as needed for anxiety. 10/28/15   Birdie Sons, MD  amLODipine (NORVASC) 10 MG tablet Take 10 mg by mouth daily. 01/07/16 01/06/17  Historical Provider, MD  atorvastatin (LIPITOR) 80 MG tablet Take 1 tablet (80 mg total) by mouth at bedtime. 04/25/15   Birdie Sons, MD  cetirizine (ZYRTEC) 10 MG tablet Take 10 mg by mouth at bedtime.    Historical Provider, MD  citalopram (CELEXA) 20 MG tablet Take 20 mg by mouth daily.    Historical Provider, MD  clopidogrel (PLAVIX) 75 MG tablet Take 1 tablet (75 mg total) by mouth daily. 10/08/15   Nicholes Mango, MD  cyclobenzaprine (FLEXERIL) 10 MG tablet Take 1 tablet (10 mg total) by mouth every 8 (eight) hours as needed for muscle spasms. 01/08/16   Birdie Sons, MD  ELIQUIS 5 MG TABS tablet TAKE 1 TABLET BY MOUTH TWICE A DAY. 02/05/16   Birdie Sons, MD  furosemide (LASIX) 80 MG tablet TAKE 1 TABLET BY MOUTH TWICE A DAY. 03/26/16   Birdie Sons, MD  isosorbide mononitrate (IMDUR) 60 MG 24 hr tablet Take 120 mg by mouth daily.     Historical Provider, MD  lansoprazole (PREVACID) 30 MG capsule Take 30 mg by mouth daily at 12 noon.    Historical Provider, MD  magnesium oxide (MAG-OX) 400 MG tablet Take 400 mg by mouth. 12/25/15 12/24/16  Historical Provider, MD  metFORMIN (GLUCOPHAGE) 500 MG tablet Take 500 mg by mouth daily.    Historical Provider, MD  mometasone-formoterol (DULERA) 200-5 MCG/ACT AERO Inhale 1 puff into the lungs 2 (two) times daily.    Historical Provider, MD  nitroGLYCERIN (NITROSTAT) 0.4 MG SL tablet Place 0.4 mg under the tongue every 5 (five) minutes as needed for chest pain.    Historical Provider, MD   omega-3 acid ethyl esters (LOVAZA) 1 g capsule Take 4 g by mouth daily.    Historical Provider, MD  oxyCODONE-acetaminophen (PERCOCET) 10-325 MG tablet Take 1 tablet by mouth every 4 (four) hours as needed for pain. 03/21/16   Birdie Sons, MD  potassium chloride (K-DUR,KLOR-CON) 10 MEQ tablet Take 10 mEq by mouth daily.    Historical Provider, MD  ranolazine (RANEXA) 500 MG 12 hr tablet Take 2 tablets (1,000 mg total) by mouth 2 (two) times daily. 03/03/16   Birdie Sons, MD  sotalol (BETAPACE) 120  MG tablet Take 120 mg by mouth. 12/25/15   Historical Provider, MD  SPIRIVA HANDIHALER 18 MCG inhalation capsule INHALE ONE CAPSULE AS DIRECTED ONCE A DAY 01/22/16   Birdie Sons, MD  tamsulosin (FLOMAX) 0.4 MG CAPS capsule Take 0.4 mg by mouth at bedtime.     Historical Provider, MD    REVIEW OF SYSTEMS:  Review of Systems  Constitutional: Negative for fever, chills, weight loss and malaise/fatigue.  HENT: Negative for ear pain, hearing loss and tinnitus.   Eyes: Negative for blurred vision, double vision, pain and redness.  Respiratory: Positive for shortness of breath. Negative for cough and hemoptysis.   Cardiovascular: Positive for chest pain and leg swelling. Negative for palpitations and orthopnea.  Gastrointestinal: Negative for nausea, vomiting, abdominal pain, diarrhea and constipation.  Genitourinary: Negative for dysuria, frequency and hematuria.  Musculoskeletal: Negative for back pain, joint pain and neck pain.  Skin:       No acne, rash, or lesions  Neurological: Negative for dizziness, tremors, focal weakness and weakness.  Endo/Heme/Allergies: Negative for polydipsia. Does not bruise/bleed easily.  Psychiatric/Behavioral: Negative for depression. The patient is not nervous/anxious and does not have insomnia.      VITAL SIGNS:   Filed Vitals:   03/26/16 1930 03/26/16 2012 03/26/16 2016 03/26/16 2028  BP: 115/75 117/71 112/64 119/74  Pulse: 57 60 68 61  Temp:       TempSrc:      Resp: 9 16 16 16   Height:      Weight:      SpO2: 97% 97% 94% 94%   Wt Readings from Last 3 Encounters:  03/26/16 110.678 kg (244 lb)  03/18/16 109.317 kg (241 lb)  02/26/16 109.317 kg (241 lb)    PHYSICAL EXAMINATION:  Physical Exam  Vitals reviewed. Constitutional: He is oriented to person, place, and time. He appears well-developed and well-nourished. No distress.  HENT:  Head: Normocephalic and atraumatic.  Mouth/Throat: Oropharynx is clear and moist.  Eyes: Conjunctivae and EOM are normal. Pupils are equal, round, and reactive to light. No scleral icterus.  Neck: Normal range of motion. Neck supple. No JVD present. No thyromegaly present.  Cardiovascular: Normal rate, regular rhythm and intact distal pulses.  Exam reveals no gallop and no friction rub.   No murmur heard. Respiratory: Effort normal and breath sounds normal. No respiratory distress. He has no wheezes. He has no rales.  GI: Soft. Bowel sounds are normal. He exhibits no distension. There is no tenderness.  Musculoskeletal: Normal range of motion. He exhibits edema (2+ BLLE).  No arthritis, no gout  Lymphadenopathy:    He has no cervical adenopathy.  Neurological: He is alert and oriented to person, place, and time. No cranial nerve deficit.  No dysarthria, no aphasia  Skin: Skin is warm and dry. No rash noted. No erythema.  Psychiatric: He has a normal mood and affect. His behavior is normal. Judgment and thought content normal.    LABORATORY PANEL:   CBC  Recent Labs Lab 03/26/16 1714  WBC 5.6  HGB 10.6*  HCT 32.6*  PLT 243   ------------------------------------------------------------------------------------------------------------------  Chemistries   Recent Labs Lab 03/26/16 1714  NA 137  K 4.4  CL 102  CO2 27  GLUCOSE 145*  BUN 28*  CREATININE 1.78*  CALCIUM 9.0    ------------------------------------------------------------------------------------------------------------------  Cardiac Enzymes  Recent Labs Lab 03/26/16 1714  TROPONINI <0.03   ------------------------------------------------------------------------------------------------------------------  RADIOLOGY:  Dg Chest 2 View  03/26/2016  CLINICAL DATA:  Chest  pain and shortness of breath for 3 weeks EXAM: CHEST  2 VIEW COMPARISON:  Mar 18, 2016 FINDINGS: There is minimal scarring in the left lung base region. There is no edema or consolidation. Heart size and pulmonary vascularity are normal. No adenopathy. Patient is status post internal mammary bypass grafting. There is a stent in the left anterior descending coronary artery. There is degenerative change in the thoracic spine. IMPRESSION: Minimal scarring left base.  No edema or consolidation. Electronically Signed   By: Lowella Grip III M.D.   On: 03/26/2016 18:55    EKG:   Orders placed or performed during the hospital encounter of 03/26/16  . EKG 12-Lead  . EKG 12-Lead  . ED EKG within 10 minutes  . ED EKG within 10 minutes    IMPRESSION AND PLAN:  Principal Problem:   Unstable angina [facility]) - patient is on multiple prior catheterizations, including most recent catheterization done in [facility] where he was found to have a 99% blockage in one of his stents. His report. He states that his cardiologist "cleaned this out", but told him he would likely need repeat catheterization within a year of that time to replace the stent. He presents today with symptoms as per history of present illness. We will admit him for observation to the telemetry floor, cycles cardiac enzymes tonight, get an echocardiogram and cardiology consult. Active Problems:   CAD (coronary artery disease) - continue home meds, the workup as above.   HTN (hypertension) - currently stable, currently normotensive, continue some home meds   DM (diabetes  mellitus) ([place]) - sliding scale insulin with corresponding glucose checks   Chronic diastolic CHF (congestive heart failure) ([place]) - continue diuretic, but we will reduce his home dose. Per chart review of prior hospitalization in [facility] and other office notes his diuretic dose has been increased and decreased multiple times recently. Of note now his kidney function is slowly creeping up from a was a normal level several months ago to 1.7 today. Request cardiology address appropriate diuretic dose to treat his heart failure but also experienced kidneys as much as possible.   PAF (paroxysmal atrial fibrillation) (HCC) - continue rate and rhythm controlling medications as well as anticoagulation   Anxiety - home dose anxiolytics   GERD (gastroesophageal reflux disease) - home dose PPI   HLD (hyperlipidemia) - home dose anti-lipids   COPD (chronic obstructive pulmonary disease) ([place]) - continue home inhalers  All the records are reviewed and case discussed with ED provider. Management plans discussed with the patient and/or family.  DVT PROPHYLAXIS: Systemic anticoagulation  GI PROPHYLAXIS: PPI  ADMISSION STATUS: Observation  CODE STATUS: Full Code Status History    Date Active Date Inactive Code Status Order ID Comments User Context   10/07/2015  9:36 AM 10/07/2015  6:30 PM Full Code [id number]  Dionisio Mekhai Venuto, MD Inpatient   10/07/2015  8:26 AM 10/07/2015  9:36 AM Full Code [id number]  Dionisio Ilyana Manuele, MD Inpatient   10/05/2015  7:29 PM 10/07/2015  8:26 AM Full Code [id number]  Idelle Crouch, MD Inpatient   09/12/2015  9:29 PM 09/14/2015  2:25 PM Full Code [id number]  Demetrios Loll, MD Inpatient   08/01/2015  5:21 AM 08/02/2015  1:46 PM Full Code [id number]  Lance Coon, MD Inpatient   07/12/2015  3:24 PM 07/13/2015  8:17 PM Full Code [id number]  Belva Crome, MD Inpatient   07/11/2015  8:00 PM 07/12/2015  3:24 PM Full Code [id number]  Lonn Georgia, PA-C Inpatient   06/26/2015  5:43 AM 06/27/2015   3:30 PM Full Code [id number]  Juluis Mire, MD Inpatient      TOTAL TIME TAKING CARE OF THIS PATIENT: 40 minutes.    Letisia Schwalb [place] 03/26/2016, 8:49 PM  [facility]  Office  [phone]  CC: Primary care physician; [name], MD

## 2016-03-26 NOTE — ED Notes (Signed)
Pt says he went to his pcp this am and that he was told he may need another heart stent.  Says they had done a ct scan couple weeks ago and hispcp told him it was not his lungs, and that he needs a heart cath.  His pcp was contacting dr [name], but pt says he could not wait and came here.

## 2016-03-26 NOTE — ED Provider Notes (Signed)
[facility] Emergency Department Provider Note  ____________________________________________    I have reviewed the triage vital signs and the nursing notes.   HISTORY  Chief Complaint Chest Pain    HPI Lawrence [name] is a 60 y.o. male who presents with complaints of chest pain. Patient reports he went to his pulmonologist today after having a CT scan done last week and his pulmonologist told him that his chest pain is not caused by his lungs but it must be by his heart. He reports a history of heart disease, including a CABG. He reports resting chest pain which is worse with exertion. He denies fevers or chills. No cough. Shortness of breath. His cardiologist is at [facility]     Past Medical History  Diagnosis Date  . CAD (coronary artery disease)     a. 2002 CABGx2 (LIMA->LAD, VG->VG->OM1);  b. 09/2012 DES->OM;  c. 03/2015 PTCA of LAD [facility]) in setting of atretic LIMA; d. 05/2015 Cath [facility]): nonobs dzs; e. 06/2015 Cath ([facility]): LM nl, LAD 45p/d ISR, 50d, D1/2 small, LCX 50p/d ISR, OM1 70ost, 30 ISR, VG->OM1 50ost, 53m LIMA->LAD 99p/d - atretic, RCA dom, nl; f.cath 10/16: 40-50%(FFR 0.90) pLAD, 75% (FFR 0.77) mLAD s/p PCI/DES, oRCA 40% (FFR0.95)  . Essential hypertension   . COPD (chronic obstructive pulmonary disease) ([place]     a. Chronic bronchitis and emphysema.  . Type II diabetes mellitus ([place]   . Asthma   . Celiac disease   . Chronic diastolic CHF (congestive heart failure) ([place]     a. 06/2009 Echo: EF 60-65%, Gr 1 DD, triv AI, mildly dil LA, nl RV.  .Marland KitchenAnxiety   . History of tobacco abuse     a. Quit 2014.  .Marland KitchenPSVT (paroxysmal supraventricular tachycardia) ([place]     a. 10/2012 Noted on Zio Patch.  . Dysrhythmia   . A-fib ([facility]     Patient Active Problem List   Diagnosis Date Noted  . Paroxysmal atrial fibrillation ([place] 12/23/2015  . OSA (obstructive sleep apnea) 12/10/2015  . Left inguinal hernia 11/07/2015  . Acute anxiety 11/07/2015  . Unstable  angina (HC[place] 10/05/2015  . PAF (paroxysmal atrial fibrillation) ([place]De[place] 10/05/2015  . Back pain with left-sided radiculopathy 09/30/2015  . Nocturnal hypoxia 09/06/2015  . BPH (benign prostatic hyperplasia) 08/01/2015  . Chronic diastolic CHF (congestive heart failure) ([place]   . Angina at rest ([facility]   . Chest pain 07/11/2015  . COPD (chronic obstructive pulmonary disease) ([place] 07/03/2015  . CAD (coronary artery disease) 06/26/2015  . HTN (hypertension) 06/26/2015  . DM (diabetes mellitus) (HR[place] 06/26/2015  . Achalasia 07/24/2014  . GERD (gastroesophageal reflux disease) 06/07/2014  . Former tobacco use 04/11/2013  . Chest pain at rest 04/09/2013  . HLD (hyperlipidemia) 04/09/2013    Past Surgical History  Procedure Laterality Date  . Vascular surgery    . Bypass graft    . Tonsillectomy    . Cholecystectomy    . Cardiac catheterization N/A 07/12/2015    rocedure: Left Heart Cath and Cors/Grafts Angiography;  Surgeon: HBelva Crome MD;  [place];  Service: Cardiovascular;  Laterality: N/A;  . Esophageal dilation    . Cardiac catheterization Right 10/07/2015    Procedure: Left Heart Cath and Cors/Grafts Angiography;  Surgeon: SDionisio David MD;  [place];  Service: Cardiovascular;  Laterality: Right;    Current Outpatient Rx  Name  Route  Sig  Dispense  Refill  . acetaminophen (  TYLENOL) 325 MG tablet   Oral   Take 2 tablets (650 mg total) by mouth every 6 (six) hours as needed for mild pain or headache.         . albuterol (PROVENTIL HFA;VENTOLIN HFA) 108 (90 Base) MCG/ACT inhaler      Inhale 4-6 puffs by mouth every 4 hours as needed for wheezing, cough, and/or shortness of breath   1 Inhaler   1   . ALPRAZolam (XANAX) 1 MG tablet   Oral   Take 0.5-1 tablets (0.5-1 mg total) by mouth 3 (three) times daily as needed for anxiety.   90 tablet   3   . amLODipine (NORVASC) 10 MG tablet   Oral   Take 10 mg by mouth daily.          Marland Kitchen atorvastatin (LIPITOR) 80 MG tablet   Oral   Take 1 tablet (80 mg total) by mouth at bedtime.   90 tablet   3   . cetirizine (ZYRTEC) 10 MG tablet   Oral   Take 10 mg by mouth at bedtime.         . citalopram (CELEXA) 20 MG tablet   Oral   Take 20 mg by mouth daily.         . clopidogrel (PLAVIX) 75 MG tablet   Oral   Take 1 tablet (75 mg total) by mouth daily.   30 tablet   5   . cyclobenzaprine (FLEXERIL) 10 MG tablet   Oral   Take 1 tablet (10 mg total) by mouth every 8 (eight) hours as needed for muscle spasms.   30 tablet   5   . ELIQUIS 5 MG TABS tablet      TAKE 1 TABLET BY MOUTH TWICE A DAY.   60 tablet   3   . furosemide (LASIX) 80 MG tablet      TAKE 1 TABLET BY MOUTH TWICE A DAY.   60 tablet   5   . isosorbide mononitrate (IMDUR) 60 MG 24 hr tablet   Oral   Take 120 mg by mouth daily.          . lansoprazole (PREVACID) 30 MG capsule   Oral   Take 30 mg by mouth daily at 12 noon.         . magnesium oxide (MAG-OX) 400 MG tablet   Oral   Take 400 mg by mouth.         . metFORMIN (GLUCOPHAGE) 500 MG tablet   Oral   Take 500 mg by mouth daily.         . mometasone-formoterol (DULERA) 200-5 MCG/ACT AERO   Inhalation   Inhale 1 puff into the lungs 2 (two) times daily.         . nitroGLYCERIN (NITROSTAT) 0.4 MG SL tablet   Sublingual   Place 0.4 mg under the tongue every 5 (five) minutes as needed for chest pain.         Marland Kitchen omega-3 acid ethyl esters (LOVAZA) 1 g capsule   Oral   Take 4 g by mouth daily.         Marland Kitchen oxyCODONE-acetaminophen (PERCOCET) 10-325 MG tablet   Oral   Take 1 tablet by mouth every 4 (four) hours as needed for pain.   150 tablet   0   . potassium chloride (K-DUR,KLOR-CON) 10 MEQ tablet   Oral   Take 10 mEq by mouth daily.         Marland Kitchen  ranolazine (RANEXA) 500 MG 12 hr tablet   Oral   Take 2 tablets (1,000 mg total) by mouth 2 (two) times daily.   120 tablet   0   . sotalol (BETAPACE) 120 MG  tablet   Oral   Take 120 mg by mouth.         . SPIRIVA HANDIHALER 18 MCG inhalation capsule      INHALE ONE CAPSULE AS DIRECTED ONCE A DAY   30 capsule   5   . tamsulosin (FLOMAX) 0.4 MG CAPS capsule   Oral   Take 0.4 mg by mouth at bedtime.            Allergies Demerol; Prednisone; Sulfa antibiotics; Albuterol sulfate; and Morphine sulfate  Family History  Problem Relation Age of Onset  . Heart attack Mother   . Cirrhosis Mother   . Diabetes Mother   . Depression Mother   . Heart disease Mother   . Heart attack Father   . Diabetes Father   . Depression Father   . Heart disease Father   . Parkinson's disease Brother     Social History Social History  Substance Use Topics  . Smoking status: Former Smoker    Quit date: 04/22/2013  . Smokeless tobacco: Never Used  . Alcohol Use: No     Comment: remotely quit alcohol use. Hx of heavy alcohol use.    Review of Systems  Constitutional: Negative for fever. Eyes: Negative for redness ENT: Negative for sore throat Cardiovascular: As above Respiratory: Negative for shortness of breath. Gastrointestinal: Negative for abdominal pain Genitourinary: Negative for dysuria. Musculoskeletal: Negative for back pain. Skin: Negative for rash. Neurological: Negative for focal weakness Psychiatric: Mild anxiety    ____________________________________________   PHYSICAL EXAM:  VITAL SIGNS: ED Triage Vitals  Enc Vitals Group     BP 03/26/16 1654 107/52 mmHg     Pulse Rate 03/26/16 1654 67     Resp 03/26/16 1654 14     Temp 03/26/16 1654 97.7 F (36.5 C)     Temp Source 03/26/16 1654 Oral     SpO2 03/26/16 1654 96 %     Weight 03/26/16 1654 244 lb (110.678 kg)     Height 03/26/16 1654 5' 7"  (1.702 m)     Head Cir --      Peak Flow --      Pain Score 03/26/16 1655 9     Pain Loc --      Pain Edu? --      Excl. in [place]? --      Constitutional: Alert and oriented. Well appearing and in no distress.  Eyes:  Conjunctivae are normal. No erythema or injection ENT   Head: Normocephalic and atraumatic.   Mouth/Throat: Mucous membranes are moist. Cardiovascular: Normal rate, regular rhythm. Normal and symmetric distal pulses are present in the upper extremities.  Respiratory: Normal respiratory effort without tachypnea nor retractions. Breath sounds are clear and equal bilaterally.  Gastrointestinal: Soft and non-tender in all quadrants. No distention. There is no CVA tenderness. Genitourinary: deferred Musculoskeletal: Nontender with normal range of motion in all extremities. No lower extremity tenderness nor edema. Neurologic:  Normal speech and language. No gross focal neurologic deficits are appreciated. Skin:  Skin is warm, dry and intact. No rash noted. Psychiatric: Mood and affect are normal. Patient exhibits appropriate insight and judgment.  ____________________________________________    LABS (pertinent positives/negatives)  Labs Reviewed  BASIC METABOLIC PANEL - Abnormal; Notable for the following:  Glucose, Bld 145 (*)    BUN 28 (*)    Creatinine, Ser 1.78 (*)    GFR calc non Af Amer 39 (*)    GFR calc Af Amer 45 (*)    All other components within normal limits  CBC - Abnormal; Notable for the following:    RBC 4.21 (*)    Hemoglobin 10.6 (*)    HCT 32.6 (*)    MCV 77.6 (*)    MCH 25.1 (*)    RDW 20.5 (*)    All other components within normal limits  TROPONIN I    ____________________________________________   EKG  ED ECG REPORT I, Lavonia Drafts, the attending physician, personally viewed and interpreted this ECG.  Date: 03/26/2016 EKG Time: 4:48 PM Rate: 69 Rhythm: normal sinus rhythm QRS Axis: normal Intervals: normal ST/T Wave abnormalities: normal Conduction Disturbances: none Narrative Interpretation: unremarkable   ____________________________________________    RADIOLOGY  Chest x-ray  unremarkable  ____________________________________________   PROCEDURES  Procedure(s) performed: none  Critical Care performed: none  ____________________________________________   INITIAL IMPRESSION / ASSESSMENT AND PLAN / ED COURSE  Pertinent labs & imaging results that were available during my care of the patient were reviewed by me and considered in my medical decision making (see chart for details).  Patient presents with chest pain. Recently seen for similar complaints. His EKG is unremarkable. His initial troponin is normal. He does have a significant cardiac history as well as multiple comorbidities. We will give nitroglycerin and morphine for the discomfort and admit him to the hospital for further management.  ____________________________________________   FINAL CLINICAL IMPRESSION(S) / ED DIAGNOSES  Final diagnoses:  Chest pain, unspecified chest pain type          Lavonia Drafts, MD 03/26/16 2026

## 2016-03-26 NOTE — ED Notes (Signed)
Pt c/o chest pain 7/10. MD notified, see MAR.

## 2016-03-26 NOTE — ED Notes (Signed)
Pt reports he is still having chest pain 7/10, MD notified, see MAR.

## 2016-03-26 NOTE — ED Notes (Signed)
Pt reports feeling nauseous at this time. Zofran provided to pt to relieve nausea, see MAR.

## 2016-03-27 ENCOUNTER — Observation Stay
Admit: 2016-03-27 | Discharge: 2016-03-27 | Disposition: A | Discharge status: Home / Self Care | Payer: Commercial Managed Care - HMO | Attending: Internal Medicine | Admitting: Internal Medicine

## 2016-03-27 DIAGNOSIS — I25110 Atherosclerotic heart disease of native coronary artery with unstable angina pectoris: Secondary | ICD-10-CM | POA: Diagnosis not present

## 2016-03-27 DIAGNOSIS — E119 Type 2 diabetes mellitus without complications: Secondary | ICD-10-CM | POA: Diagnosis not present

## 2016-03-27 DIAGNOSIS — I10 Essential (primary) hypertension: Secondary | ICD-10-CM | POA: Diagnosis not present

## 2016-03-27 DIAGNOSIS — I2510 Atherosclerotic heart disease of native coronary artery without angina pectoris: Secondary | ICD-10-CM | POA: Diagnosis not present

## 2016-03-27 DIAGNOSIS — I200 Unstable angina: Secondary | ICD-10-CM | POA: Diagnosis not present

## 2016-03-27 DIAGNOSIS — R079 Chest pain, unspecified: Secondary | ICD-10-CM | POA: Diagnosis not present

## 2016-03-27 LAB — GLUCOSE, CAPILLARY
Glucose-Capillary: 106 mg/dL — ABNORMAL HIGH (ref 65–99)
Glucose-Capillary: 115 mg/dL — ABNORMAL HIGH (ref 65–99)
Glucose-Capillary: 134 mg/dL — ABNORMAL HIGH (ref 65–99)
Glucose-Capillary: 136 mg/dL — ABNORMAL HIGH (ref 65–99)

## 2016-03-27 LAB — CBC
HCT: 32.5 % — ABNORMAL LOW (ref 40.0–52.0)
Hemoglobin: 10.4 g/dL — ABNORMAL LOW (ref 13.0–18.0)
MCH: 25.1 pg — ABNORMAL LOW (ref 26.0–34.0)
MCHC: 32.2 g/dL (ref 32.0–36.0)
MCV: 78.2 fL — ABNORMAL LOW (ref 80.0–100.0)
Platelets: 220 10*3/uL (ref 150–440)
RBC: 4.15 MIL/uL — ABNORMAL LOW (ref 4.40–5.90)
RDW: 19.9 % — ABNORMAL HIGH (ref 11.5–14.5)
WBC: 5.5 10*3/uL (ref 3.8–10.6)

## 2016-03-27 LAB — BASIC METABOLIC PANEL
Anion gap: 2 — ABNORMAL LOW (ref 5–15)
BUN: 24 mg/dL — ABNORMAL HIGH (ref 6–20)
CO2: 33 mmol/L — ABNORMAL HIGH (ref 22–32)
Calcium: 9 mg/dL (ref 8.9–10.3)
Chloride: 104 mmol/L (ref 101–111)
Creatinine, Ser: 1.45 mg/dL — ABNORMAL HIGH (ref 0.61–1.24)
GFR calc Af Amer: 58 mL/min — ABNORMAL LOW (ref >60)
GFR calc non Af Amer: 50 mL/min — ABNORMAL LOW (ref >60)
Glucose, Bld: 114 mg/dL — ABNORMAL HIGH (ref 65–99)
Potassium: 4.2 mmol/L (ref 3.5–5.1)
Sodium: 139 mmol/L (ref 135–145)

## 2016-03-27 LAB — TROPONIN I
Troponin I: <0.03 ng/mL (ref <0.031)
Troponin I: <0.03 ng/mL (ref <0.031)

## 2016-03-27 LAB — HEMOGLOBIN A1C: Hgb A1c MFr Bld: 6.4 % — ABNORMAL HIGH (ref 4.0–6.0)

## 2016-03-27 MED ORDER — MORPHINE SULFATE (PF) 4 MG/ML IV SOLN
4.0000 mg | INTRAVENOUS | Status: DC | PRN
  Administered 2016-03-27 – 2016-03-28 (×4): 4 mg via INTRAVENOUS
  Filled 2016-03-27 (×4): qty 1

## 2016-03-27 MED ORDER — PANTOPRAZOLE SODIUM 40 MG PO TBEC
40.0000 mg | DELAYED_RELEASE_TABLET | Freq: Every day | ORAL | Status: DC
  Administered 2016-03-27 – 2016-03-28 (×2): 40 mg via ORAL
  Filled 2016-03-27 (×2): qty 1

## 2016-03-27 MED ORDER — OXYCODONE-ACETAMINOPHEN 5-325 MG PO TABS
1.0000 | ORAL_TABLET | ORAL | Status: DC | PRN
  Administered 2016-03-27 – 2016-03-28 (×4): 1 via ORAL
  Filled 2016-03-27 (×4): qty 1

## 2016-03-27 NOTE — Progress Notes (Signed)
*  PRELIMINARY RESULTS* Echocardiogram 2D Echocardiogram has been performed.  Lawrence [name] 03/27/2016, 1:32 PM

## 2016-03-27 NOTE — Consult Note (Signed)
[place]  CARDIOLOGY CONSULT NOTE  Patient ID: ALEN MATHESON MRN: [id number] DOB/AGE: [birth year] 60 y.o.  Admit date: 03/26/2016 Referring Physician Dr. Lavetta Nielsen Primary Physician [facility] Primary Cardiologist [facility] Reason for Consultation chest pain  HPI: Pt is a 60 yo male with history of oxygen dependent copd, history of cad s/p cabg, history of psvt, history of diastolic chf who was admitted with chest pain and sob. Pt states if he walks very short distances, he gets sob with drop in oxygen sats. He states his chest pain is constantly there. He has ruled out for an mi. He has had multiple heart caths with the most recent being carried out by Dr. Humphrey Rolls in 11/16 which revealed Patent lima to lad with patent stent in mid lad, patent svg to om1. This was similar to the two prevous [facility]s [facility] at [facility]. He was treated medically. He is on aggressive medications including ranexa 1000 mg bid, long acting nitrates, beta blockers eliquis for afib prophylaxis. He continues ot have resting chest 0pain which has been constant for some time. EKG shows nsr with no ischemia  Review of Systems  Constitutional: Positive for malaise/fatigue.  HENT: Negative.   Eyes: Negative.   Respiratory: Positive for shortness of breath.   Cardiovascular: Positive for chest pain, palpitations and leg swelling.  Gastrointestinal: Negative.   Genitourinary: Negative.   Musculoskeletal: Negative.   Skin: Negative.   Neurological: Positive for weakness.  Endo/Heme/Allergies: Negative.   Psychiatric/Behavioral: Negative.     Past Medical History  Diagnosis Date  . CAD (coronary artery disease)     a. 2002 CABGx2 (LIMA->LAD, VG->VG->OM1);  b. 09/2012 DES->OM;  c. 03/2015 PTCA of LAD [facility]) in setting of atretic LIMA; d. 05/2015 Cath [facility]): nonobs dzs; e. 06/2015 Cath ([facility]): LM nl, LAD 45p/d ISR, 50d, D1/2 small, LCX 50p/d ISR, OM1 70ost, 30 ISR, VG->OM1 50ost, 4m LIMA->LAD 99p/d -  atretic, RCA dom, nl; f.cath 10/16: 40-50%(FFR 0.90) pLAD, 75% (FFR 0.77) mLAD s/p PCI/DES, oRCA 40% (FFR0.95)  . Essential hypertension   . COPD (chronic obstructive pulmonary disease) (HD[place]     a. Chronic bronchitis and emphysema.  . Type II diabetes mellitus (HC[place]   . Asthma   . Celiac disease   . Chronic diastolic CHF (congestive heart failure) ([place]     a. 06/2009 Echo: EF 60-65%, Gr 1 DD, triv AI, mildly dil LA, nl RV.  .Marland KitchenAnxiety   . History of tobacco abuse     a. Quit 2014.  .Marland KitchenPSVT (paroxysmal supraventricular tachycardia) (HLat[place]     a. 10/2012 Noted on Zio Patch.  . Dysrhythmia   . [facility] ([facility]     Family History  Problem Relation Age of Onset  . Heart attack Mother   . Cirrhosis Mother   . Diabetes Mother   . Depression Mother   . Heart disease Mother   . Heart attack Father   . Diabetes Father   . Depression Father   . Heart disease Father   . Parkinson's disease Brother     Social History   Social History  . Marital Status: Married    Spouse Name: N/A  . Number of Children: N/A  . Years of Education: N/A   Occupational History  . Disabled    Social History Main Topics  . Smoking status: Former Smoker    Quit date: 04/22/2013  . Smokeless tobacco: Never Used  . Alcohol Use: No  Comment: remotely quit alcohol use. Hx of heavy alcohol use.  . Drug Use: No  . Sexual Activity: Not on file   Other Topics Concern  . Not on file   Social History Narrative   Pt lives in [place] with wife.  Does not routinely exercise.    Past Surgical History  Procedure Laterality Date  . Vascular surgery    . Bypass graft    . Tonsillectomy    . Cholecystectomy    . Cardiac catheterization N/A 07/12/2015    rocedure: Left Heart Cath and Cors/Grafts Angiography;  Surgeon: Belva Crome, MD;  Location: [place] CV LAB;  Service: Cardiovascular;  Laterality: N/A;  . Esophageal dilation    . Cardiac catheterization Right 10/07/2015    Procedure: Left Heart Cath  and Cors/Grafts Angiography;  Surgeon: Dionisio David, MD;  Location: [place] CV LAB;  Service: Cardiovascular;  Laterality: Right;     Prescriptions prior to admission  Medication Sig Dispense Refill Last Dose  . acetaminophen (TYLENOL) 325 MG tablet Take 2 tablets (650 mg total) by mouth every 6 (six) hours as needed for mild pain or headache.   prn at prn  . albuterol (PROVENTIL HFA;VENTOLIN HFA) 108 (90 Base) MCG/ACT inhaler Inhale 4-6 puffs by mouth every 4 hours as needed for wheezing, cough, and/or shortness of breath 1 Inhaler 1 prn at prn  . ALPRAZolam (XANAX) 1 MG tablet Take 0.5-1 tablets (0.5-1 mg total) by mouth 3 (three) times daily as needed for anxiety. 90 tablet 3 03/26/2016 at Unknown time  . amLODipine (NORVASC) 10 MG tablet Take 10 mg by mouth daily.   03/26/2016 at Unknown time  . atorvastatin (LIPITOR) 80 MG tablet Take 1 tablet (80 mg total) by mouth at bedtime. 90 tablet 3 03/25/2016 at Unknown time  . cetirizine (ZYRTEC) 10 MG tablet Take 10 mg by mouth at bedtime.   03/26/2016 at Unknown time  . citalopram (CELEXA) 20 MG tablet Take 20 mg by mouth daily.   03/26/2016 at Unknown time  . clopidogrel (PLAVIX) 75 MG tablet Take 1 tablet (75 mg total) by mouth daily. 30 tablet 5 03/26/2016 at Unknown time  . cyclobenzaprine (FLEXERIL) 10 MG tablet Take 1 tablet (10 mg total) by mouth every 8 (eight) hours as needed for muscle spasms. 30 tablet 5 03/26/2016 at Unknown time  . ELIQUIS 5 MG TABS tablet TAKE 1 TABLET BY MOUTH TWICE A DAY. 60 tablet 3 03/26/2016 at Unknown time  . furosemide (LASIX) 80 MG tablet TAKE 1 TABLET BY MOUTH TWICE A DAY. 60 tablet 5 03/26/2016 at Unknown time  . isosorbide mononitrate (IMDUR) 60 MG 24 hr tablet Take 120 mg by mouth daily.    03/26/2016 at Unknown time  . lansoprazole (PREVACID) 30 MG capsule Take 30 mg by mouth daily at 12 noon.   03/26/2016 at Unknown time  . magnesium oxide (MAG-OX) 400 MG tablet Take 400 mg by mouth.   03/26/2016 at Unknown  time  . metFORMIN (GLUCOPHAGE) 500 MG tablet Take 500 mg by mouth daily.   03/26/2016 at Unknown time  . mometasone-formoterol (DULERA) 200-5 MCG/ACT AERO Inhale 1 puff into the lungs 2 (two) times daily.   03/26/2016 at Unknown time  . nitroGLYCERIN (NITROSTAT) 0.4 MG SL tablet Place 0.4 mg under the tongue every 5 (five) minutes as needed for chest pain.   03/26/2016 at Unknown time  . omega-3 acid ethyl esters (LOVAZA) 1 g capsule Take 4 g by mouth daily.  03/26/2016 at Unknown time  . oxyCODONE-acetaminophen (PERCOCET) 10-325 MG tablet Take 1 tablet by mouth every 4 (four) hours as needed for pain. 150 tablet 0 03/26/2016 at Unknown time  . potassium chloride (K-DUR,KLOR-CON) 10 MEQ tablet Take 10 mEq by mouth daily.   03/26/2016 at Unknown time  . ranolazine (RANEXA) 500 MG 12 hr tablet Take 2 tablets (1,000 mg total) by mouth 2 (two) times daily. 120 tablet 0 03/26/2016 at Unknown time  . sotalol (BETAPACE) 120 MG tablet Take 120 mg by mouth.   03/26/2016 at Unknown time  . SPIRIVA HANDIHALER 18 MCG inhalation capsule INHALE ONE CAPSULE AS DIRECTED ONCE A DAY 30 capsule 5 03/26/2016 at Unknown time  . tamsulosin (FLOMAX) 0.4 MG CAPS capsule Take 0.4 mg by mouth at bedtime.    03/26/2016 at Unknown time    Physical Exam: Blood pressure 109/68, pulse 60, temperature 98.2 F (36.8 C), temperature source Oral, resp. rate 19, height 5' 7"  (1.702 m), weight 108.954 kg (240 lb 3.2 oz), SpO2 93 %.   Wt Readings from Last 1 Encounters:  03/27/16 108.954 kg (240 lb 3.2 oz)     General appearance: alert, cooperative and moderately obese Head: Normocephalic, without obvious abnormality, atraumatic Resp: diminished breath sounds bilaterally Chest wall: no tenderness Cardio: regular rate and rhythm GI: soft, non-tender; bowel sounds normal; no masses,  no organomegaly Extremities: edema 2+ bilateral leg edema Neurologic: Grossly normal  Labs:   Lab Results  Component Value Date   WBC 5.5 03/27/2016    HGB 10.4* 03/27/2016   HCT 32.5* 03/27/2016   MCV 78.2* 03/27/2016   PLT 220 03/27/2016    Recent Labs Lab 03/27/16 0407  NA 139  K 4.2  CL 104  CO2 33*  BUN 24*  CREATININE 1.45*  CALCIUM 9.0  GLUCOSE 114*   Lab Results  Component Value Date   CKTOTAL 93 05/23/2014   CKMB 1.7 05/23/2014   TROPONINI <0.03 03/27/2016      Radiology: Minimal scarring left base, no edema or consolidation EKG: nsr with nssttw changes. No ischemia  ASSESSMENT AND PLAN:  Pt is a 60 yo male with hisotry of cad s/p cabg with a lima to lad and svg to om1. He has had a pci in the mid lad after anastomosis of lima. He has had miltiple cardiac caths with the most recent in 11/16. The last two have shown no significant progression of his disease. He is on aggressive medicaitons including ranexa, imdur, plavix, eliquis, and sotolol. Pain is constant and he has ruled out for an mi. Would defer cardiac cath at present and follow on meds. Does not appear to be having an acs event. Continue with brochodilators and oxygen.  Signed: Teodoro Spray MD, [facility] 03/27/2016, 2:05 PM

## 2016-03-27 NOTE — Care Management Obs Status (Signed)
[place] NOTIFICATION   Patient Details  Name: Lawrence [name] MRN: [id number] Date of Birth: Jan 21, 1956   Medicare Observation Status Notification Given:  Yes    Jolly Mango, RN 03/27/2016, 3:09 PM

## 2016-03-27 NOTE — Progress Notes (Signed)
[facility] [place] NAME: Lawrence [name]    MRN#:  [id number]  DATE OF BIRTH:  10-15-1956  SUBJECTIVE:  Hospital Day: none Lawrence [name] is a 60 y.o. male presenting with Chest Pain .   Overnight events: No acute events Interval Events: Still complains of retrosternal chest pain ranging from sharp to pressure-like these are occurring at rest  REVIEW OF SYSTEMS:  CONSTITUTIONAL: No fever, fatigue or weakness.  EYES: No blurred or double vision.  EARS, NOSE, AND THROAT: No tinnitus or ear pain.  RESPIRATORY: No cough, shortness of breath, wheezing or hemoptysis.  CARDIOVASCULAR: Positive chest pain, denies orthopnea, edema.  GASTROINTESTINAL: No nausea, vomiting, diarrhea or abdominal pain.  GENITOURINARY: No dysuria, hematuria.  ENDOCRINE: No polyuria, nocturia,  HEMATOLOGY: No anemia, easy bruising or bleeding SKIN: No rash or lesion. MUSCULOSKELETAL: No joint pain or arthritis.   NEUROLOGIC: No tingling, numbness, weakness.  PSYCHIATRY: No anxiety or depression.   DRUG ALLERGIES:   Allergies  Allergen Reactions  . Demerol [Meperidine] Hives  . Prednisone Hypertension    Reaction: pt states it puts him in afib.  . Sulfa Antibiotics Hives  . Albuterol Sulfate [Albuterol] Palpitations and Other (See Comments)    Pt still currently uses this medication.    . Morphine Sulfate Nausea And Vomiting, Rash and Other (See Comments)    Pt states that he is only allergic to the tablet form of this medication.      VITALS:  Blood pressure 109/68, pulse 60, temperature 98.2 F (36.8 C), temperature source Oral, resp. rate 19, height 5' 7"  (1.702 m), weight 108.954 kg (240 lb 3.2 oz), SpO2 93 %.  PHYSICAL EXAMINATION:  VITAL SIGNS: Filed Vitals:   03/27/16 0725 03/27/16 1110  BP: 122/64 109/68  Pulse: 61 60  Temp:  98.2 F (36.8 C)  Resp: 18 54   GENERAL:60 y.o.male currently in no acute distress.  HEAD: Normocephalic,  atraumatic.  EYES: Pupils equal, round, reactive to light. Extraocular muscles intact. No scleral icterus.  MOUTH: Moist mucosal membrane. Dentition intact. No abscess noted.  EAR, NOSE, THROAT: Clear without exudates. No external lesions.  NECK: Supple. No thyromegaly. No nodules. No JVD.  PULMONARY: Clear to ascultation, without wheeze rails or rhonci. No use of accessory muscles, Good respiratory effort. good air entry bilaterally CHEST: Nontender to palpation.  CARDIOVASCULAR: S1 and S2. Regular rate and rhythm. No murmurs, rubs, or gallops. No edema. Pedal pulses 2+ bilaterally.  GASTROINTESTINAL: Soft, nontender, nondistended. No masses. Positive bowel sounds. No hepatosplenomegaly.  MUSCULOSKELETAL: No swelling, clubbing, or edema. Range of motion full in all extremities.  NEUROLOGIC: Cranial nerves II through XII are intact. No gross focal neurological deficits. Sensation intact. Reflexes intact.  SKIN: No ulceration, lesions, rashes, or cyanosis. Skin warm and dry. Turgor intact.  PSYCHIATRIC: Mood, affect within normal limits. The patient is awake, alert and oriented x 3. Insight, judgment intact.      LABORATORY PANEL:   CBC  Recent Labs Lab 03/27/16 0407  WBC 5.5  HGB 10.4*  HCT 32.5*  PLT 220   ------------------------------------------------------------------------------------------------------------------  Chemistries   Recent Labs Lab 03/27/16 0407  NA 139  K 4.2  CL 104  CO2 33*  GLUCOSE 114*  BUN 24*  CREATININE 1.45*  CALCIUM 9.0   ------------------------------------------------------------------------------------------------------------------  Cardiac Enzymes  Recent Labs Lab 03/27/16 1022  TROPONINI <0.03   ------------------------------------------------------------------------------------------------------------------  RADIOLOGY:  Dg Chest 2 View  03/26/2016  CLINICAL DATA:  Chest pain  and shortness of breath for 3 weeks EXAM: CHEST  2  VIEW COMPARISON:  Mar 18, 2016 FINDINGS: There is minimal scarring in the left lung base region. There is no edema or consolidation. Heart size and pulmonary vascularity are normal. No adenopathy. Patient is status post internal mammary bypass grafting. There is a stent in the left anterior descending coronary artery. There is degenerative change in the thoracic spine. IMPRESSION: Minimal scarring left base.  No edema or consolidation. Electronically Signed   By: Lowella Grip III M.D.   On: 03/26/2016 18:55    EKG:   Orders placed or performed during the hospital encounter of 03/26/16  . EKG 12-Lead  . EKG 12-Lead  . ED EKG within 10 minutes  . ED EKG within 10 minutes    ASSESSMENT AND PLAN:   Lawrence [name] is a 60 y.o. male presenting with Chest Pain . Admitted 03/26/2016 : Day #: none 1. Chest pain, central: Cardiology consult pending at this time continue with home medications including aspirin and Plavix, statin, Ranexa, Imdur 2. Essential hypertension Norvasc and Lasix 3. Hyperlipidemia unspecified statin therapy 4. GERD without esophagitis PPI therapy 5. Venous thromboembolism prophylactic: Therapeutic eliquis   All the records are reviewed and case discussed with Care Management/Social Workerr. Management plans discussed with the patient, family and they are in agreement.  CODE STATUS: full TOTAL TIME TAKING CARE OF THIS PATIENT: 28 minutes.   POSSIBLE D/C IN 1-2DAYS, DEPENDING ON CLINICAL CONDITION.   Santosha Jividen,  Karenann Cai.D on 03/27/2016 at 12:58 PM  Between 7am to 6pm - Pager - [phone]  After 6pm: House Pager: - [facility]  Office  [phone]  CC: Primary care physician; [name], MD

## 2016-03-28 ENCOUNTER — Observation Stay: Payer: Commercial Managed Care - HMO

## 2016-03-28 DIAGNOSIS — E119 Type 2 diabetes mellitus without complications: Secondary | ICD-10-CM | POA: Diagnosis not present

## 2016-03-28 DIAGNOSIS — R079 Chest pain, unspecified: Secondary | ICD-10-CM | POA: Diagnosis not present

## 2016-03-28 DIAGNOSIS — I10 Essential (primary) hypertension: Secondary | ICD-10-CM | POA: Diagnosis not present

## 2016-03-28 DIAGNOSIS — I25110 Atherosclerotic heart disease of native coronary artery with unstable angina pectoris: Secondary | ICD-10-CM | POA: Diagnosis not present

## 2016-03-28 DIAGNOSIS — I2510 Atherosclerotic heart disease of native coronary artery without angina pectoris: Secondary | ICD-10-CM | POA: Diagnosis not present

## 2016-03-28 DIAGNOSIS — I5030 Unspecified diastolic (congestive) heart failure: Secondary | ICD-10-CM | POA: Diagnosis not present

## 2016-03-28 LAB — ECHOCARDIOGRAM COMPLETE
Height: 67 in
Weight: 3843.2 oz

## 2016-03-28 LAB — GLUCOSE, CAPILLARY
Glucose-Capillary: 104 mg/dL — ABNORMAL HIGH (ref 65–99)
Glucose-Capillary: 110 mg/dL — ABNORMAL HIGH (ref 65–99)

## 2016-03-28 NOTE — Progress Notes (Signed)
MD aware of CXR results, showing no acute process, per MD, OK to continue with discharge as planned.

## 2016-03-28 NOTE — Care Management Note (Signed)
Case Management Note  Patient Details  Name: Lawrence [name] MRN: [id number] Date of Birth: June 20, 1956  Subjective/Objective:      Discharge home today. Chronic 2L N/C via [employer]. Reports that his son is bringing his portable oxygen concentrator to the hospital today for use during transport home.               Action/Plan:   Expected Discharge Date:                  Expected Discharge Plan:     In-House Referral:     Discharge planning Services     Post Acute Care Choice:    Choice offered to:     DME Arranged:    DME Agency:     HH Arranged:    [place] Agency:     Status of Service:     Medicare Important Message Given:    Date Medicare IM Given:    Medicare IM give by:    Date Additional Medicare IM Given:    Additional Medicare Important Message give by:     If discussed at [place] of Stay Meetings, dates discussed:    Additional Comments:  Inigo Lantigua A, RN 03/28/2016, 10:46 AM

## 2016-03-28 NOTE — Progress Notes (Signed)
Pt. Discharged to home via wc. Discharge instructions and medication regimen reviewed at bedside with patient. Pt. verbalizes understanding of instructions and medication regimen. No prescriptions. Patient assessment unchanged from this morning. TELE and IV discontinued per policy.

## 2016-03-28 NOTE — Discharge Summary (Signed)
[facility] [place] NAME: Lawrence [name]    MR#:  [id number]  DATE OF BIRTH:  11/04/1956  DATE OF ADMISSION:  03/26/2016 ADMITTING PHYSICIAN: Lance Coon, MD  DATE OF DISCHARGE: 03/28/2016  PRIMARY CARE PHYSICIAN: [name], MD    ADMISSION DIAGNOSIS:  Chest pain, unspecified chest pain type [R07.9]  DISCHARGE DIAGNOSIS:  Principal Problem:   Unstable angina ([place]) Active Problems:   CAD (coronary artery disease)   HTN (hypertension)   DM (diabetes mellitus) (HCC)   GERD (gastroesophageal reflux disease)   HLD (hyperlipidemia)   COPD (chronic obstructive pulmonary disease) (HCC)   Chronic diastolic CHF (congestive heart failure) (HCC)   PAF (paroxysmal atrial fibrillation) ([place])   Anxiety   SECONDARY DIAGNOSIS:   Past Medical History  Diagnosis Date  . CAD (coronary artery disease)     a. 2002 CABGx2 (LIMA->LAD, VG->VG->OM1);  b. 09/2012 DES->OM;  c. 03/2015 PTCA of LAD [facility]) in setting of atretic LIMA; d. 05/2015 Cath [facility]): nonobs dzs; e. 06/2015 Cath ([facility]): LM nl, LAD 45p/d ISR, 50d, D1/2 small, LCX 50p/d ISR, OM1 70ost, 30 ISR, VG->OM1 50ost, 32m LIMA->LAD 99p/d - atretic, RCA dom, nl; f.cath 10/16: 40-50%(FFR 0.90) pLAD, 75% (FFR 0.77) mLAD s/p PCI/DES, oRCA 40% (FFR0.95)  . Essential hypertension   . COPD (chronic obstructive pulmonary disease) ([place]     a. Chronic bronchitis and emphysema.  . Type II diabetes mellitus ([place]   . Asthma   . Celiac disease   . Chronic diastolic CHF (congestive heart failure) ([place]     a. 06/2009 Echo: EF 60-65%, Gr 1 DD, triv AI, mildly dil LA, nl RV.  .Marland KitchenAnxiety   . History of tobacco abuse     a. Quit 2014.  .Marland KitchenPSVT (paroxysmal supraventricular tachycardia) ([place]     a. 10/2012 Noted on Zio Patch.  . Dysrhythmia   . A-fib ([facility]     HOSPITAL COURSE:   60yo male with history of oxygen dependent copd, history of cad s/p cabg, history of psvt, history of diastolic chf who was admitted  with chest pain and sob.  1.Chest pain: Patient was ruled out for acute coronary syndrome. He has a history  of cad s/p cabg with a lima to lad and svg to om1. He has had a pci in the mid lad after anastomosis of lima. He has had multiple cardiac caths with the most recent in 11/16. The last two have shown no significant progression of his disease. He is on aggressive medications including ranexa, imdur, plavix, eliquis, and sotolol. His chest pain is constant and therefore not thought to be due to ACS. Due to numerous cardiac catheterizations without significant changes and is atypical features to not pursue cardiac catheterization at this time. Patient will continue aggressive medication management and follow-up with Dr. KNehemiah Massedon Friday. Echocardiogram showed normal ejection fraction without major valvular abnormalities.  2. Essential hypertension: Patient will continue Norvasc and isosorbide.  3. Atrial fibrillation: Patient will continue sotalol and Eliquis.  4. Hyperlipidemia: Continue statin.  DISCHARGE CONDITIONS AND DIET:   Stable for discharge on heart healthy diet/diabetic  CONSULTS OBTAINED:  Treatment Team:  KTeodoro Spray MD  DRUG ALLERGIES:   Allergies  Allergen Reactions  . Demerol [Meperidine] Hives  . Prednisone Hypertension    Reaction: pt states it puts him in afib.  . Sulfa Antibiotics Hives  . Albuterol Sulfate [Albuterol] Palpitations and Other (See Comments)    Pt still currently  uses this medication.    . Morphine Sulfate Nausea And Vomiting, Rash and Other (See Comments)    Pt states that he is only allergic to the tablet form of this medication.      DISCHARGE MEDICATIONS:   Current Discharge Medication List    CONTINUE these medications which have NOT CHANGED   Details  acetaminophen (TYLENOL) 325 MG tablet Take 2 tablets (650 mg total) by mouth every 6 (six) hours as needed for mild pain or headache.    albuterol (PROVENTIL HFA;VENTOLIN HFA)  108 (90 Base) MCG/ACT inhaler Inhale 4-6 puffs by mouth every 4 hours as needed for wheezing, cough, and/or shortness of breath Qty: 1 Inhaler, Refills: 1    ALPRAZolam (XANAX) 1 MG tablet Take 0.5-1 tablets (0.5-1 mg total) by mouth 3 (three) times daily as needed for anxiety. Qty: 90 tablet, Refills: 3   Associated Diagnoses: Acute anxiety    amLODipine (NORVASC) 10 MG tablet Take 10 mg by mouth daily.    atorvastatin (LIPITOR) 80 MG tablet Take 1 tablet (80 mg total) by mouth at bedtime. Qty: 90 tablet, Refills: 3    cetirizine (ZYRTEC) 10 MG tablet Take 10 mg by mouth at bedtime.    citalopram (CELEXA) 20 MG tablet Take 20 mg by mouth daily.    clopidogrel (PLAVIX) 75 MG tablet Take 1 tablet (75 mg total) by mouth daily. Qty: 30 tablet, Refills: 5    cyclobenzaprine (FLEXERIL) 10 MG tablet Take 1 tablet (10 mg total) by mouth every 8 (eight) hours as needed for muscle spasms. Qty: 30 tablet, Refills: 5    ELIQUIS 5 MG TABS tablet TAKE 1 TABLET BY MOUTH TWICE A DAY. Qty: 60 tablet, Refills: 3    furosemide (LASIX) 80 MG tablet TAKE 1 TABLET BY MOUTH TWICE A DAY. Qty: 60 tablet, Refills: 5    isosorbide mononitrate (IMDUR) 60 MG 24 hr tablet Take 120 mg by mouth daily.     lansoprazole (PREVACID) 30 MG capsule Take 30 mg by mouth daily at 12 noon.    magnesium oxide (MAG-OX) 400 MG tablet Take 400 mg by mouth.    metFORMIN (GLUCOPHAGE) 500 MG tablet Take 500 mg by mouth daily.    mometasone-formoterol (DULERA) 200-5 MCG/ACT AERO Inhale 1 puff into the lungs 2 (two) times daily.    nitroGLYCERIN (NITROSTAT) 0.4 MG SL tablet Place 0.4 mg under the tongue every 5 (five) minutes as needed for chest pain.    omega-3 acid ethyl esters (LOVAZA) 1 g capsule Take 4 g by mouth daily.    oxyCODONE-acetaminophen (PERCOCET) 10-325 MG tablet Take 1 tablet by mouth every 4 (four) hours as needed for pain. Qty: 150 tablet, Refills: 0   Associated Diagnoses: Back pain with left-sided  radiculopathy    potassium chloride (K-DUR,KLOR-CON) 10 MEQ tablet Take 10 mEq by mouth daily.    ranolazine (RANEXA) 500 MG 12 hr tablet Take 2 tablets (1,000 mg total) by mouth 2 (two) times daily. Qty: 120 tablet, Refills: 0    sotalol (BETAPACE) 120 MG tablet Take 120 mg by mouth.    SPIRIVA HANDIHALER 18 MCG inhalation capsule INHALE ONE CAPSULE AS DIRECTED ONCE A DAY Qty: 30 capsule, Refills: 5    tamsulosin (FLOMAX) 0.4 MG CAPS capsule Take 0.4 mg by mouth at bedtime.               Today   CHIEF COMPLAINT:   Doing ok her with constant chest pain and saw his lung doctor recently  for SOB his ECHO here is normal. He is taking eliquis and denies LEE   VITAL SIGNS:  Blood pressure 136/72, pulse 54, temperature 98.2 F (36.8 C), temperature source Oral, resp. rate 18, height 5' 7"  (1.702 m), weight 108.46 kg (239 lb 1.8 oz), SpO2 93 %.   REVIEW OF SYSTEMS:  Review of Systems  Constitutional: Negative for fever, chills and malaise/fatigue.  HENT: Negative for ear discharge, ear pain, hearing loss, nosebleeds and sore throat.   Eyes: Negative for blurred vision and pain.  Respiratory: Positive for shortness of breath (occasionally). Negative for cough, hemoptysis and wheezing.   Cardiovascular: Positive for chest pain (chronic). Negative for palpitations and leg swelling.  Gastrointestinal: Negative for nausea, vomiting, abdominal pain, diarrhea and blood in stool.  Genitourinary: Negative for dysuria.  Musculoskeletal: Negative for back pain.  Neurological: Negative for dizziness, tremors, speech change, focal weakness, seizures and headaches.  Endo/Heme/Allergies: Does not bruise/bleed easily.  Psychiatric/Behavioral: Negative for depression, suicidal ideas and hallucinations.     PHYSICAL EXAMINATION:  GENERAL:  60 y.o.-year-old patient lying in the bed with no acute distress.  NECK:  Supple, no jugular venous distention. No thyroid enlargement, no tenderness.   LUNGS: Normal breath sounds bilaterally, no wheezing, rales,rhonchi  No use of accessory muscles of respiration.  CARDIOVASCULAR: S1, S2 normal. No murmurs, rubs, or gallops.  ABDOMEN: Soft, non-tender, non-distended. Bowel sounds present. No organomegaly or mass.  EXTREMITIES: No pedal edema, cyanosis, or clubbing.  PSYCHIATRIC: The patient is alert and oriented x 3.  SKIN: No obvious rash, lesion, or ulcer.   DATA REVIEW:   CBC  Recent Labs Lab 03/27/16 0407  WBC 5.5  HGB 10.4*  HCT 32.5*  PLT 220    Chemistries   Recent Labs Lab 03/27/16 0407  NA 139  K 4.2  CL 104  CO2 33*  GLUCOSE 114*  BUN 24*  CREATININE 1.45*  CALCIUM 9.0    Cardiac Enzymes  Recent Labs Lab 03/26/16 2244 03/27/16 0407 03/27/16 1022  TROPONINI <0.03 <0.03 <0.03    Microbiology Results  @[email or web address] @  RADIOLOGY:  Dg Chest 2 View  03/26/2016  CLINICAL DATA:  Chest pain and shortness of breath for 3 weeks EXAM: CHEST  2 VIEW COMPARISON:  Mar 18, 2016 FINDINGS: There is minimal scarring in the left lung base region. There is no edema or consolidation. Heart size and pulmonary vascularity are normal. No adenopathy. Patient is status post internal mammary bypass grafting. There is a stent in the left anterior descending coronary artery. There is degenerative change in the thoracic spine. IMPRESSION: Minimal scarring left base.  No edema or consolidation. Electronically Signed   By: Lowella Grip III M.D.   On: 03/26/2016 18:55      Management plans discussed with the patient and he is in agreement. Stable for discharge home  Patient should follow up with Dr. [name] in 1 week and PCP in one week  CODE STATUS:     Code Status Orders        Start     Ordered   03/26/16 2224  Full code   Continuous     03/26/16 2223    Code Status History    Date Active Date Inactive Code Status Order ID Comments User Context   10/07/2015  9:36 AM 10/07/2015  6:30 PM Full Code [id number]   Dionisio David, MD Inpatient   10/07/2015  8:26 AM 10/07/2015  9:36 AM Full Code [id number]  Dionisio David, MD Inpatient  10/05/2015  7:29 PM 10/07/2015  8:26 AM Full Code [id number]  Idelle Crouch, MD Inpatient   09/12/2015  9:29 PM 09/14/2015  2:25 PM Full Code [id number]  Demetrios Loll, MD Inpatient   08/01/2015  5:21 AM 08/02/2015  1:46 PM Full Code [id number]  Lance Coon, MD Inpatient   07/12/2015  3:24 PM 07/13/2015  8:17 PM Full Code [id number]  Belva Crome, MD Inpatient   07/11/2015  8:00 PM 07/12/2015  3:24 PM Full Code [id number]  Lonn Georgia, PA-C Inpatient   06/26/2015  5:43 AM 06/27/2015  3:30 PM Full Code [id number]  Juluis Mire, MD Inpatient      TOTAL TIME TAKING CARE OF THIS PATIENT: 36 minutes.    Note: This dictation was prepared with Dragon dictation along with smaller phrase technology. Any transcriptional errors that result from this process are unintentional.  Steffani Dionisio M.D on 03/28/2016 at 11:18 AM  Between 7am to 6pm - Pager - [phone] After 6pm go to [email or web address] - password [email or web address] [facility]  Office  [phone]  CC: Primary care physician; [name], MD

## 2016-03-30 ENCOUNTER — Encounter: Payer: Self-pay | Admitting: *Deleted

## 2016-03-30 ENCOUNTER — Other Ambulatory Visit: Payer: Self-pay | Admitting: *Deleted

## 2016-03-30 NOTE — Patient Outreach (Signed)
Transition of care call successful (week 1, discharged 5/13).  Spoke with pt, HIPPA verified.  Pt reports since discharge- tired, weak on knees, sleeping a lot.  Pt reports f/u with Lung MD 5/11, CT scan was done, told it was heart related, referral made to f/u with Dr. [name].  Pt reports reason for recent hospitalization was chest pain. Pt reports he is scheduled to f/u with Dr. [name] tomorrow, will call Primary Care MD after that to see about f/u with him earlier than August.  Pt reports he has been weighing every other day, as instructed by MD to start doing every day/record. Pt reports he was told to take an extra Lasix for swelling, only has a little swelling now.    Pt reports he is taking all of his medications per discharge paper, no problems affording. Pt reports he is sob all the time, on 2 Liters of oxygen.  RN CM discussed transition of care program, doing weekly phone calls 31 days post discharge plus a home visit.  As discussed with pt, plan to do a home visit 5/24.     Plan to inform Dr. [name] of [facility] involvement- send barrier letter.   Zara Chess.   [name]  [phone]

## 2016-03-31 DIAGNOSIS — R0602 Shortness of breath: Secondary | ICD-10-CM | POA: Diagnosis not present

## 2016-03-31 DIAGNOSIS — E782 Mixed hyperlipidemia: Secondary | ICD-10-CM | POA: Diagnosis not present

## 2016-03-31 DIAGNOSIS — I10 Essential (primary) hypertension: Secondary | ICD-10-CM | POA: Diagnosis not present

## 2016-03-31 DIAGNOSIS — I25118 Atherosclerotic heart disease of native coronary artery with other forms of angina pectoris: Secondary | ICD-10-CM | POA: Diagnosis not present

## 2016-04-02 IMAGING — CR DG CHEST 1V PORT
1 series · 1 of 1 positions shown · non-contrast
Comparison: 12/03/2013 and 10/11/2013.

CLINICAL DATA: Chest pain.

EXAM:
PORTABLE CHEST - 1 VIEW

[ap]
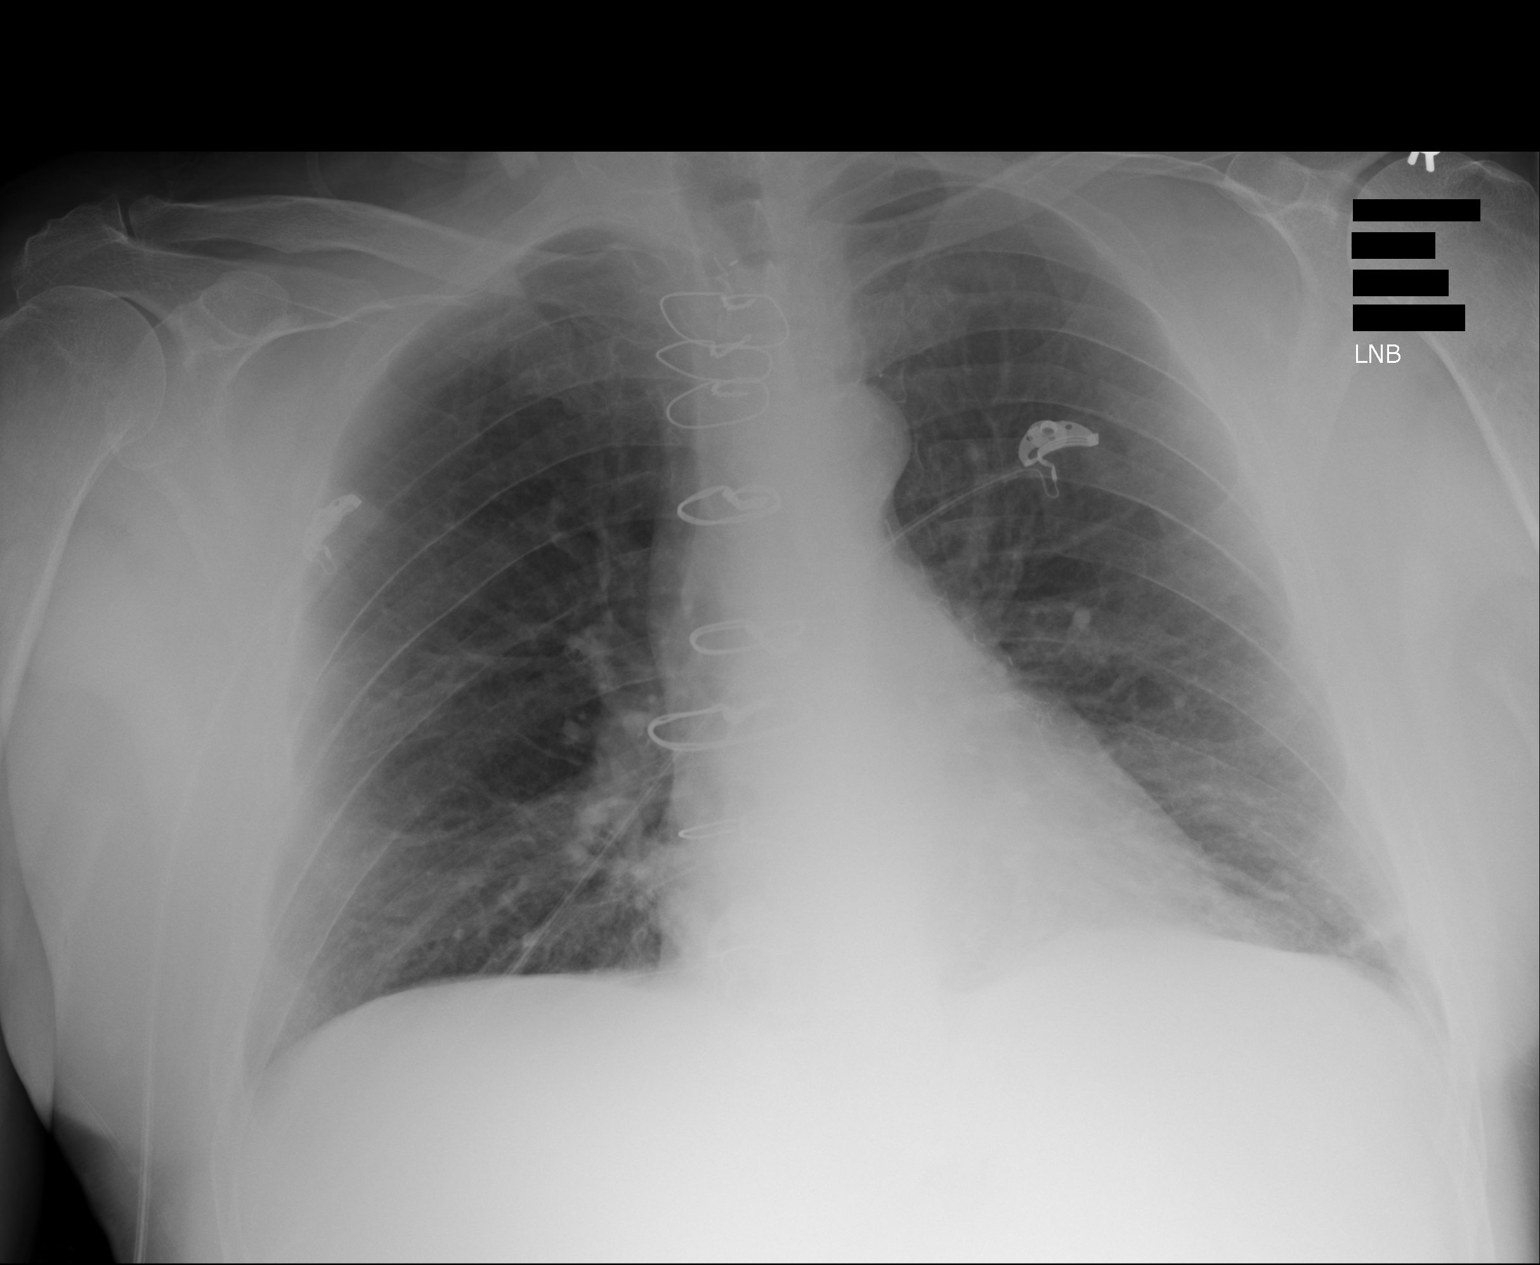

[1 of 1 positions shown; findings below may reference images not displayed]

FINDINGS: 0930 hr. The heart size and mediastinal contours are stable status
post CABG. The lungs are hyperinflated but clear. There is no
pleural effusion or pneumothorax. No acute osseous findings are
evident.
IMPRESSION: Stable postoperative chest.  No acute cardiopulmonary process.

## 2016-04-03 ENCOUNTER — Encounter: Payer: Self-pay | Admitting: Emergency Medicine

## 2016-04-03 ENCOUNTER — Inpatient Hospital Stay
Admission: EM | Admit: 2016-04-03 | Discharge: 2016-04-07 | DRG: 287 | Disposition: A | Discharge status: Home / Self Care | Payer: Commercial Managed Care - HMO | Attending: Internal Medicine | Admitting: Internal Medicine

## 2016-04-03 ENCOUNTER — Emergency Department: Payer: Commercial Managed Care - HMO

## 2016-04-03 DIAGNOSIS — K900 Celiac disease: Secondary | ICD-10-CM | POA: Diagnosis present

## 2016-04-03 DIAGNOSIS — Z885 Allergy status to narcotic agent status: Secondary | ICD-10-CM | POA: Diagnosis not present

## 2016-04-03 DIAGNOSIS — N183 Chronic kidney disease, stage 3 (moderate): Secondary | ICD-10-CM | POA: Diagnosis present

## 2016-04-03 DIAGNOSIS — E1122 Type 2 diabetes mellitus with diabetic chronic kidney disease: Secondary | ICD-10-CM | POA: Diagnosis present

## 2016-04-03 DIAGNOSIS — N289 Disorder of kidney and ureter, unspecified: Secondary | ICD-10-CM | POA: Diagnosis not present

## 2016-04-03 DIAGNOSIS — R0600 Dyspnea, unspecified: Secondary | ICD-10-CM | POA: Diagnosis present

## 2016-04-03 DIAGNOSIS — I959 Hypotension, unspecified: Secondary | ICD-10-CM

## 2016-04-03 DIAGNOSIS — Z9981 Dependence on supplemental oxygen: Secondary | ICD-10-CM | POA: Diagnosis not present

## 2016-04-03 DIAGNOSIS — F419 Anxiety disorder, unspecified: Secondary | ICD-10-CM | POA: Diagnosis present

## 2016-04-03 DIAGNOSIS — Z8249 Family history of ischemic heart disease and other diseases of the circulatory system: Secondary | ICD-10-CM

## 2016-04-03 DIAGNOSIS — J449 Chronic obstructive pulmonary disease, unspecified: Secondary | ICD-10-CM | POA: Diagnosis present

## 2016-04-03 DIAGNOSIS — Z9861 Coronary angioplasty status: Secondary | ICD-10-CM | POA: Diagnosis not present

## 2016-04-03 DIAGNOSIS — Z7901 Long term (current) use of anticoagulants: Secondary | ICD-10-CM

## 2016-04-03 DIAGNOSIS — I2510 Atherosclerotic heart disease of native coronary artery without angina pectoris: Secondary | ICD-10-CM | POA: Diagnosis not present

## 2016-04-03 DIAGNOSIS — Z6837 Body mass index (BMI) 37.0-37.9, adult: Secondary | ICD-10-CM

## 2016-04-03 DIAGNOSIS — N182 Chronic kidney disease, stage 2 (mild): Secondary | ICD-10-CM

## 2016-04-03 DIAGNOSIS — Z833 Family history of diabetes mellitus: Secondary | ICD-10-CM | POA: Diagnosis not present

## 2016-04-03 DIAGNOSIS — I5032 Chronic diastolic (congestive) heart failure: Secondary | ICD-10-CM | POA: Diagnosis present

## 2016-04-03 DIAGNOSIS — R079 Chest pain, unspecified: Secondary | ICD-10-CM

## 2016-04-03 DIAGNOSIS — D649 Anemia, unspecified: Secondary | ICD-10-CM | POA: Diagnosis present

## 2016-04-03 DIAGNOSIS — E785 Hyperlipidemia, unspecified: Secondary | ICD-10-CM | POA: Diagnosis present

## 2016-04-03 DIAGNOSIS — J45909 Unspecified asthma, uncomplicated: Secondary | ICD-10-CM | POA: Diagnosis present

## 2016-04-03 DIAGNOSIS — Z951 Presence of aortocoronary bypass graft: Secondary | ICD-10-CM | POA: Diagnosis not present

## 2016-04-03 DIAGNOSIS — I130 Hypertensive heart and chronic kidney disease with heart failure and stage 1 through stage 4 chronic kidney disease, or unspecified chronic kidney disease: Secondary | ICD-10-CM | POA: Diagnosis present

## 2016-04-03 DIAGNOSIS — I10 Essential (primary) hypertension: Secondary | ICD-10-CM | POA: Diagnosis not present

## 2016-04-03 DIAGNOSIS — J9610 Chronic respiratory failure, unspecified whether with hypoxia or hypercapnia: Secondary | ICD-10-CM | POA: Diagnosis present

## 2016-04-03 DIAGNOSIS — I480 Paroxysmal atrial fibrillation: Secondary | ICD-10-CM | POA: Diagnosis present

## 2016-04-03 DIAGNOSIS — E669 Obesity, unspecified: Secondary | ICD-10-CM | POA: Diagnosis present

## 2016-04-03 DIAGNOSIS — Z888 Allergy status to other drugs, medicaments and biological substances status: Secondary | ICD-10-CM

## 2016-04-03 DIAGNOSIS — R0602 Shortness of breath: Secondary | ICD-10-CM | POA: Diagnosis not present

## 2016-04-03 DIAGNOSIS — Z87891 Personal history of nicotine dependence: Secondary | ICD-10-CM | POA: Diagnosis not present

## 2016-04-03 DIAGNOSIS — I25110 Atherosclerotic heart disease of native coronary artery with unstable angina pectoris: Principal | ICD-10-CM | POA: Diagnosis present

## 2016-04-03 DIAGNOSIS — G4733 Obstructive sleep apnea (adult) (pediatric): Secondary | ICD-10-CM | POA: Diagnosis present

## 2016-04-03 DIAGNOSIS — Z882 Allergy status to sulfonamides status: Secondary | ICD-10-CM | POA: Diagnosis not present

## 2016-04-03 DIAGNOSIS — K219 Gastro-esophageal reflux disease without esophagitis: Secondary | ICD-10-CM | POA: Diagnosis present

## 2016-04-03 DIAGNOSIS — I200 Unstable angina: Secondary | ICD-10-CM | POA: Diagnosis present

## 2016-04-03 LAB — PROTIME-INR
INR: 1.45
Prothrombin Time: 17.7 seconds — ABNORMAL HIGH (ref 11.4–15.0)

## 2016-04-03 LAB — BASIC METABOLIC PANEL
Anion gap: 7 (ref 5–15)
BUN: 21 mg/dL — ABNORMAL HIGH (ref 6–20)
CO2: 29 mmol/L (ref 22–32)
Calcium: 8.8 mg/dL — ABNORMAL LOW (ref 8.9–10.3)
Chloride: 101 mmol/L (ref 101–111)
Creatinine, Ser: 1.57 mg/dL — ABNORMAL HIGH (ref 0.61–1.24)
GFR calc Af Amer: 53 mL/min — ABNORMAL LOW (ref >60)
GFR calc non Af Amer: 46 mL/min — ABNORMAL LOW (ref >60)
Glucose, Bld: 124 mg/dL — ABNORMAL HIGH (ref 65–99)
Potassium: 4.1 mmol/L (ref 3.5–5.1)
Sodium: 137 mmol/L (ref 135–145)

## 2016-04-03 LAB — CBC
HCT: 31.8 % — ABNORMAL LOW (ref 40.0–52.0)
Hemoglobin: 10.3 g/dL — ABNORMAL LOW (ref 13.0–18.0)
MCH: 25.5 pg — ABNORMAL LOW (ref 26.0–34.0)
MCHC: 32.3 g/dL (ref 32.0–36.0)
MCV: 79 fL — ABNORMAL LOW (ref 80.0–100.0)
Platelets: 212 10*3/uL (ref 150–440)
RBC: 4.03 MIL/uL — ABNORMAL LOW (ref 4.40–5.90)
RDW: 19.6 % — ABNORMAL HIGH (ref 11.5–14.5)
WBC: 7.3 10*3/uL (ref 3.8–10.6)

## 2016-04-03 LAB — TROPONIN I
Troponin I: <0.03 ng/mL (ref <0.031)
Troponin I: <0.03 ng/mL (ref <0.031)
Troponin I: <0.03 ng/mL (ref <0.031)

## 2016-04-03 LAB — GLUCOSE, CAPILLARY: Glucose-Capillary: 131 mg/dL — ABNORMAL HIGH (ref 65–99)

## 2016-04-03 MED ORDER — CLOPIDOGREL BISULFATE 75 MG PO TABS
75.0000 mg | ORAL_TABLET | Freq: Every day | ORAL | Status: DC
Start: 2016-04-03 — End: 2016-04-07
  Administered 2016-04-04 – 2016-04-07 (×4): 75 mg via ORAL
  Filled 2016-04-03 (×4): qty 1

## 2016-04-03 MED ORDER — CYCLOBENZAPRINE HCL 10 MG PO TABS: 10.0000 mg | ORAL_TABLET | Freq: Three times a day (TID) | ORAL | Status: DC | PRN

## 2016-04-03 MED ORDER — SOTALOL HCL 120 MG PO TABS
120.0000 mg | ORAL_TABLET | Freq: Two times a day (BID) | ORAL | Status: DC
  Administered 2016-04-03 – 2016-04-07 (×7): 120 mg via ORAL
  Filled 2016-04-03 (×7): qty 1

## 2016-04-03 MED ORDER — ONDANSETRON HCL 4 MG PO TABS: 4.0000 mg | ORAL_TABLET | Freq: Four times a day (QID) | ORAL | Status: DC | PRN

## 2016-04-03 MED ORDER — INSULIN ASPART 100 UNIT/ML ~~LOC~~ SOLN: 0.0000 [IU] | Freq: Every day | SUBCUTANEOUS | Status: DC

## 2016-04-03 MED ORDER — SODIUM CHLORIDE 0.9 % IV SOLN
INTRAVENOUS | Status: DC
  Administered 2016-04-03 – 2016-04-06 (×4): via INTRAVENOUS

## 2016-04-03 MED ORDER — ASPIRIN EC 81 MG PO TBEC
81.0000 mg | DELAYED_RELEASE_TABLET | Freq: Every day | ORAL | Status: DC
  Administered 2016-04-03 – 2016-04-07 (×5): 81 mg via ORAL
  Filled 2016-04-03 (×5): qty 1

## 2016-04-03 MED ORDER — INSULIN ASPART 100 UNIT/ML ~~LOC~~ SOLN
4.0000 [IU] | Freq: Three times a day (TID) | SUBCUTANEOUS | Status: DC
  Administered 2016-04-04 – 2016-04-07 (×8): 4 [IU] via SUBCUTANEOUS
  Filled 2016-04-03 (×8): qty 4

## 2016-04-03 MED ORDER — OMEGA-3-ACID ETHYL ESTERS 1 G PO CAPS
4.0000 g | ORAL_CAPSULE | Freq: Every day | ORAL | Status: DC
  Administered 2016-04-04 – 2016-04-07 (×3): 4 g via ORAL
  Filled 2016-04-03 (×3): qty 4

## 2016-04-03 MED ORDER — ATORVASTATIN CALCIUM 20 MG PO TABS
80.0000 mg | ORAL_TABLET | Freq: Every day | ORAL | Status: DC
  Administered 2016-04-03 – 2016-04-06 (×4): 80 mg via ORAL
  Filled 2016-04-03 (×4): qty 4

## 2016-04-03 MED ORDER — OXYCODONE HCL 5 MG PO TABS
5.0000 mg | ORAL_TABLET | ORAL | Status: DC | PRN
  Administered 2016-04-04 – 2016-04-06 (×6): 5 mg via ORAL
  Filled 2016-04-03 (×7): qty 1

## 2016-04-03 MED ORDER — ACETAMINOPHEN 650 MG RE SUPP: 650.0000 mg | Freq: Four times a day (QID) | RECTAL | Status: DC | PRN

## 2016-04-03 MED ORDER — METFORMIN HCL 500 MG PO TABS
500.0000 mg | ORAL_TABLET | Freq: Every day | ORAL | Status: DC
  Administered 2016-04-04 – 2016-04-07 (×2): 500 mg via ORAL
  Filled 2016-04-03 (×2): qty 1

## 2016-04-03 MED ORDER — OXYCODONE-ACETAMINOPHEN 5-325 MG PO TABS
ORAL_TABLET | ORAL | Status: AC
  Filled 2016-04-03: qty 1

## 2016-04-03 MED ORDER — SODIUM CHLORIDE 0.9% FLUSH
3.0000 mL | Freq: Two times a day (BID) | INTRAVENOUS | Status: DC
  Administered 2016-04-03 – 2016-04-07 (×5): 3 mL via INTRAVENOUS

## 2016-04-03 MED ORDER — OXYCODONE-ACETAMINOPHEN 10-325 MG PO TABS: 1.0000 | ORAL_TABLET | ORAL | Status: DC | PRN

## 2016-04-03 MED ORDER — MAGNESIUM OXIDE 400 (241.3 MG) MG PO TABS
400.0000 mg | ORAL_TABLET | Freq: Every day | ORAL | Status: DC
  Administered 2016-04-04 – 2016-04-07 (×3): 400 mg via ORAL
  Filled 2016-04-03 (×4): qty 1

## 2016-04-03 MED ORDER — FLUTICASONE FUROATE-VILANTEROL 100-25 MCG/INH IN AEPB
1.0000 | INHALATION_SPRAY | Freq: Every day | RESPIRATORY_TRACT | Status: DC
  Administered 2016-04-05 – 2016-04-07 (×3): 1 via RESPIRATORY_TRACT
  Filled 2016-04-03: qty 28

## 2016-04-03 MED ORDER — TIOTROPIUM BROMIDE MONOHYDRATE 18 MCG IN CAPS
18.0000 ug | ORAL_CAPSULE | Freq: Every day | RESPIRATORY_TRACT | Status: DC
  Administered 2016-04-04 – 2016-04-07 (×4): 18 ug via RESPIRATORY_TRACT
  Filled 2016-04-03: qty 5

## 2016-04-03 MED ORDER — ISOSORBIDE MONONITRATE ER 60 MG PO TB24
120.0000 mg | ORAL_TABLET | Freq: Every day | ORAL | Status: DC
  Administered 2016-04-04 – 2016-04-07 (×3): 120 mg via ORAL
  Filled 2016-04-03 (×4): qty 2

## 2016-04-03 MED ORDER — SENNA 8.6 MG PO TABS
1.0000 | ORAL_TABLET | Freq: Two times a day (BID) | ORAL | Status: DC
  Administered 2016-04-04 – 2016-04-07 (×6): 8.6 mg via ORAL
  Filled 2016-04-03 (×6): qty 1

## 2016-04-03 MED ORDER — MORPHINE SULFATE (PF) 4 MG/ML IV SOLN
4.0000 mg | INTRAVENOUS | Status: DC | PRN
  Administered 2016-04-03 – 2016-04-06 (×14): 4 mg via INTRAVENOUS
  Filled 2016-04-03 (×14): qty 1

## 2016-04-03 MED ORDER — DOCUSATE SODIUM 100 MG PO CAPS
100.0000 mg | ORAL_CAPSULE | Freq: Two times a day (BID) | ORAL | Status: DC
  Administered 2016-04-04 – 2016-04-07 (×6): 100 mg via ORAL
  Filled 2016-04-03 (×6): qty 1

## 2016-04-03 MED ORDER — OXYCODONE-ACETAMINOPHEN 5-325 MG PO TABS
1.0000 | ORAL_TABLET | ORAL | Status: DC | PRN
  Administered 2016-04-03 – 2016-04-05 (×2): 1 via ORAL
  Filled 2016-04-03: qty 1

## 2016-04-03 MED ORDER — LORATADINE 10 MG PO TABS
10.0000 mg | ORAL_TABLET | Freq: Every day | ORAL | Status: DC
  Administered 2016-04-03 – 2016-04-07 (×4): 10 mg via ORAL
  Filled 2016-04-03 (×4): qty 1

## 2016-04-03 MED ORDER — APIXABAN 5 MG PO TABS
5.0000 mg | ORAL_TABLET | Freq: Two times a day (BID) | ORAL | Status: DC
  Administered 2016-04-03 – 2016-04-04 (×2): 5 mg via ORAL
  Filled 2016-04-03 (×2): qty 1

## 2016-04-03 MED ORDER — MORPHINE SULFATE (PF) 4 MG/ML IV SOLN
INTRAVENOUS | Status: AC
  Administered 2016-04-03: 4 mg via INTRAVENOUS
  Filled 2016-04-03: qty 1

## 2016-04-03 MED ORDER — ACETAMINOPHEN 325 MG PO TABS: 650.0000 mg | ORAL_TABLET | Freq: Four times a day (QID) | ORAL | Status: DC | PRN

## 2016-04-03 MED ORDER — RANOLAZINE ER 500 MG PO TB12
1000.0000 mg | ORAL_TABLET | Freq: Two times a day (BID) | ORAL | Status: DC
  Administered 2016-04-03 – 2016-04-07 (×7): 1000 mg via ORAL
  Filled 2016-04-03 (×8): qty 2

## 2016-04-03 MED ORDER — TAMSULOSIN HCL 0.4 MG PO CAPS
0.4000 mg | ORAL_CAPSULE | Freq: Every day | ORAL | Status: DC
  Administered 2016-04-04 – 2016-04-07 (×3): 0.4 mg via ORAL
  Filled 2016-04-03 (×4): qty 1

## 2016-04-03 MED ORDER — ONDANSETRON HCL 4 MG/2ML IJ SOLN: 4.0000 mg | Freq: Four times a day (QID) | INTRAMUSCULAR | Status: DC | PRN

## 2016-04-03 MED ORDER — ALPRAZOLAM 0.25 MG PO TABS
0.5000 mg | ORAL_TABLET | Freq: Three times a day (TID) | ORAL | Status: DC | PRN
  Administered 2016-04-03 – 2016-04-04 (×2): 1 mg via ORAL
  Administered 2016-04-06: 0.5 mg via ORAL
  Filled 2016-04-03 (×2): qty 1
  Filled 2016-04-03 (×2): qty 4

## 2016-04-03 MED ORDER — MORPHINE SULFATE (PF) 4 MG/ML IV SOLN
4.0000 mg | Freq: Once | INTRAVENOUS | Status: AC
  Administered 2016-04-03: 4 mg via INTRAVENOUS

## 2016-04-03 MED ORDER — ALBUTEROL SULFATE (2.5 MG/3ML) 0.083% IN NEBU: 3.0000 mL | INHALATION_SOLUTION | RESPIRATORY_TRACT | Status: DC | PRN

## 2016-04-03 MED ORDER — INSULIN ASPART 100 UNIT/ML ~~LOC~~ SOLN
0.0000 [IU] | Freq: Three times a day (TID) | SUBCUTANEOUS | Status: DC
  Administered 2016-04-04 – 2016-04-07 (×3): 2 [IU] via SUBCUTANEOUS
  Filled 2016-04-03: qty 3
  Filled 2016-04-03 (×2): qty 2

## 2016-04-03 MED ORDER — NITROGLYCERIN 0.4 MG SL SUBL: 0.4000 mg | SUBLINGUAL_TABLET | SUBLINGUAL | Status: DC | PRN

## 2016-04-03 MED ORDER — ALLOPURINOL 100 MG PO TABS
300.0000 mg | ORAL_TABLET | Freq: Two times a day (BID) | ORAL | Status: DC
  Administered 2016-04-03 – 2016-04-07 (×8): 300 mg via ORAL
  Filled 2016-04-03 (×8): qty 3

## 2016-04-03 NOTE — ED Provider Notes (Signed)
[facility] Department Provider Note   ____________________________________________  Time seen: Approximately 5:08 PM  I have reviewed the triage vital signs and the nursing notes.   HISTORY  Chief Complaint Chest Pain   HPI Lawrence [name] is a 60 y.o. male patient comes in complaining of increasing dyspnea for several days. He says now he cannot walk even 10 feet before he feels like his capacity. He also reports he developed chest pain today this morning which is sharp and tight low chest seems to get worse with exertion and also has a component of shortness of breath with it. It is moderate in nature patient reports he has seen his pulmonologist Dr. Humphrey Rolls. Dr. Humphrey Rolls feels thatthe lungs do not problem Dr. Mabeline Caras the cardiologist feels the heart is not the problem. Patient however cannot walk more than about 10 feet without getting short of breath. Patient has had a catheter showing diffuse disease with multiple partial blockages as well as a previous CABG. Patient also has a history of COPD.  Past Medical History  Diagnosis Date  . CAD (coronary artery disease)     a. 2002 CABGx2 (LIMA->LAD, VG->VG->OM1);  b. 09/2012 DES->OM;  c. 03/2015 PTCA of LAD [facility]) in setting of atretic LIMA; d. 05/2015 Cath [facility]): nonobs dzs; e. 06/2015 Cath ([facility]): LM nl, LAD 45p/d ISR, 50d, D1/2 small, LCX 50p/d ISR, OM1 70ost, 30 ISR, VG->OM1 50ost, 15m LIMA->LAD 99p/d - atretic, RCA dom, nl; f.cath 10/16: 40-50%(FFR 0.90) pLAD, 75% (FFR 0.77) mLAD s/p PCI/DES, oRCA 40% (FFR0.95)  . Essential hypertension   . COPD (chronic obstructive pulmonary disease) (HA[place]     a. Chronic bronchitis and emphysema.  . Type II diabetes mellitus (HMartin   . Asthma   . Celiac disease   . Chronic diastolic CHF (congestive heart failure) ([place]     a. 06/2009 Echo: EF 60-65%, Gr 1 DD, triv AI, mildly dil LA, nl RV.  .Marland KitchenAnxiety   . History of tobacco abuse     a. Quit 2014.  .Marland KitchenPSVT (paroxysmal  supraventricular tachycardia) (HD[place]     a. 10/2012 Noted on Zio Patch.  . Dysrhythmia   . A-fib ([facility]     Patient Active Problem List   Diagnosis Date Noted  . Paroxysmal atrial fibrillation (HW[place] 12/23/2015  . OSA (obstructive sleep apnea) 12/10/2015  . Left inguinal hernia 11/07/2015  . Anxiety 11/07/2015  . Unstable angina (HG[place] 10/05/2015  . PAF (paroxysmal atrial fibrillation) (H[place] 10/05/2015  . Back pain with left-sided radiculopathy 09/30/2015  . Nocturnal hypoxia 09/06/2015  . BPH (benign prostatic hyperplasia) 08/01/2015  . Chronic diastolic CHF (congestive heart failure) (HLeed[place]   . Angina at rest ([facility]   . Chest pain 07/11/2015  . COPD (chronic obstructive pulmonary disease) (HEgypt 07/03/2015  . CAD (coronary artery disease) 06/26/2015  . HTN (hypertension) 06/26/2015  . DM (diabetes mellitus) (HC[place] 06/26/2015  . Achalasia 07/24/2014  . GERD (gastroesophageal reflux disease) 06/07/2014  . Former tobacco use 04/11/2013  . Chest pain at rest 04/09/2013  . HLD (hyperlipidemia) 04/09/2013    Past Surgical History  Procedure Laterality Date  . Vascular surgery    . Bypass graft    . Tonsillectomy    . Cholecystectomy    . Cardiac catheterization N/A 07/12/2015    rocedure: Left Heart Cath and Cors/Grafts Angiography;  Surgeon: HBelva Crome MD;  [facility];  Service: Cardiovascular;  Laterality: N/A;  . Esophageal dilation    .  Cardiac catheterization Right 10/07/2015    Procedure: Left Heart Cath and Cors/Grafts Angiography;  Surgeon: Dionisio David, MD;  Location: [place] [facility];  Service: Cardiovascular;  Laterality: Right;    Current Outpatient Rx  Name  Route  Sig  Dispense  Refill  . fluticasone furoate-vilanterol (BREO ELLIPTA) 100-25 MCG/INH AEPB   Inhalation   Inhale 1 puff into the lungs 2 (two) times daily.         Marland Kitchen acetaminophen (TYLENOL) 325 MG tablet   Oral   Take 2 tablets (650 mg total) by mouth every 6 (six) hours as  needed for mild pain or headache.         . albuterol (PROVENTIL HFA;VENTOLIN HFA) 108 (90 Base) MCG/ACT inhaler      Inhale 4-6 puffs by mouth every 4 hours as needed for wheezing, cough, and/or shortness of breath   1 Inhaler   1   . ALPRAZolam (XANAX) 1 MG tablet   Oral   Take 0.5-1 tablets (0.5-1 mg total) by mouth 3 (three) times daily as needed for anxiety.   90 tablet   3   . amLODipine (NORVASC) 10 MG tablet   Oral   Take 10 mg by mouth daily.         Marland Kitchen atorvastatin (LIPITOR) 80 MG tablet   Oral   Take 1 tablet (80 mg total) by mouth at bedtime.   90 tablet   3   . cetirizine (ZYRTEC) 10 MG tablet   Oral   Take 10 mg by mouth at bedtime.         . citalopram (CELEXA) 20 MG tablet   Oral   Take 20 mg by mouth daily.         . clopidogrel (PLAVIX) 75 MG tablet   Oral   Take 1 tablet (75 mg total) by mouth daily.   30 tablet   5   . cyclobenzaprine (FLEXERIL) 10 MG tablet   Oral   Take 1 tablet (10 mg total) by mouth every 8 (eight) hours as needed for muscle spasms.   30 tablet   5   . ELIQUIS 5 MG TABS tablet      TAKE 1 TABLET BY MOUTH TWICE A DAY.   60 tablet   3   . furosemide (LASIX) 80 MG tablet      TAKE 1 TABLET BY MOUTH TWICE A DAY.   60 tablet   5   . isosorbide mononitrate (IMDUR) 60 MG 24 hr tablet   Oral   Take 120 mg by mouth daily.          . lansoprazole (PREVACID) 30 MG capsule   Oral   Take 30 mg by mouth daily at 12 noon.         . magnesium oxide (MAG-OX) 400 MG tablet   Oral   Take 400 mg by mouth.         . metFORMIN (GLUCOPHAGE) 500 MG tablet   Oral   Take 500 mg by mouth daily.         . metoprolol tartrate (LOPRESSOR) 25 MG tablet   Oral   Take 25 mg by mouth 2 (two) times daily.         . mometasone-formoterol (DULERA) 200-5 MCG/ACT AERO   Inhalation   Inhale 1 puff into the lungs 2 (two) times daily.         . nitroGLYCERIN (NITROSTAT) 0.4 MG SL tablet   Sublingual  Place 0.4 mg  under the tongue every 5 (five) minutes as needed for chest pain.         Marland Kitchen omega-3 acid ethyl esters (LOVAZA) 1 g capsule   Oral   Take 4 g by mouth daily.         Marland Kitchen oxyCODONE-acetaminophen (PERCOCET) 10-325 MG tablet   Oral   Take 1 tablet by mouth every 4 (four) hours as needed for pain.   150 tablet   0   . potassium chloride (K-DUR,KLOR-CON) 10 MEQ tablet   Oral   Take 10 mEq by mouth daily.         . ranolazine (RANEXA) 500 MG 12 hr tablet   Oral   Take 2 tablets (1,000 mg total) by mouth 2 (two) times daily.   120 tablet   0   . sotalol (BETAPACE) 120 MG tablet   Oral   Take 120 mg by mouth.         . SPIRIVA HANDIHALER 18 MCG inhalation capsule      INHALE ONE CAPSULE AS DIRECTED ONCE A DAY   30 capsule   5   . tamsulosin (FLOMAX) 0.4 MG CAPS capsule   Oral   Take 0.4 mg by mouth at bedtime.            Allergies Demerol; Prednisone; Sulfa antibiotics; Albuterol sulfate; and Morphine sulfate  Family History  Problem Relation Age of Onset  . Heart attack Mother   . Cirrhosis Mother   . Diabetes Mother   . Depression Mother   . Heart disease Mother   . Heart attack Father   . Diabetes Father   . Depression Father   . Heart disease Father   . Parkinson's disease Brother     Social History Social History  Substance Use Topics  . Smoking status: Former Smoker    Quit date: 04/22/2013  . Smokeless tobacco: Never Used  . Alcohol Use: No     Comment: remotely quit alcohol use. Hx of heavy alcohol use.    Review of Systems Constitutional: No fever/chills Eyes: No visual changes. ENT: No sore throat. Cardiovascular: See history of present illness. Respiratory: See history of present illness. Gastrointestinal: No abdominal pain.  No nausea, no vomiting.  No diarrhea.  No constipation. Genitourinary: Negative for dysuria. Musculoskeletal: Negative for back pain. Skin: Negative for rash. Neurological: Negative for headaches, focal  weakness or numbness.  10-point ROS otherwise negative.  ____________________________________________   PHYSICAL EXAM:  VITAL SIGNS: ED Triage Vitals  Enc Vitals Group     BP 04/03/16 1343 97/64 mmHg     Pulse Rate 04/03/16 1343 63     Resp 04/03/16 1343 22     Temp 04/03/16 1343 98.1 F (36.7 C)     Temp Source 04/03/16 1343 Oral     SpO2 04/03/16 1343 94 %     Weight 04/03/16 1343 240 lb (108.863 kg)     Height 04/03/16 1343 5' 7"  (1.702 m)     Head Cir --      Peak Flow --      Pain Score 04/03/16 1344 8     Pain Loc --      Pain Edu? --      Excl. in [place]? --     Constitutional: Alert and oriented. Well appearing and in no acute distress. Eyes: Conjunctivae are normal. PERRL. EOMI. Head: Atraumatic. Nose: No congestion/rhinnorhea. Mouth/Throat: Mucous membranes are moist.  Oropharynx non-erythematous. Neck: No stridor.  Cardiovascular: Normal rate, regular rhythm. Grossly normal heart sounds.  Good peripheral circulation. Respiratory: Normal respiratory effort.  No retractions. Lungs CTAB. Gastrointestinal: Soft and nontender. No distention. No abdominal bruits. No CVA tenderness. }Musculoskeletal: No lower extremity tenderness trace edema. No joint effusions. Neurologic:  Normal speech and language. No gross focal neurologic deficits are appreciated. No gait instability. Skin:  Skin is warm, dry and intact. No rash noted.   ____________________________________________   LABS (all labs ordered are listed, but only abnormal results are displayed)  Labs Reviewed  BASIC METABOLIC PANEL - Abnormal; Notable for the following:    Glucose, Bld 124 (*)    BUN 21 (*)    Creatinine, Ser 1.57 (*)    Calcium 8.8 (*)    GFR calc non Af Amer 46 (*)    GFR calc Af Amer 53 (*)    All other components within normal limits  CBC - Abnormal; Notable for the following:    RBC 4.03 (*)    Hemoglobin 10.3 (*)    HCT 31.8 (*)    MCV 79.0 (*)    MCH 25.5 (*)    RDW 19.6 (*)      All other components within normal limits  PROTIME-INR - Abnormal; Notable for the following:    Prothrombin Time 17.7 (*)    All other components within normal limits  TROPONIN I  TROPONIN I   ____________________________________________  EKG  EKG read and interpreted by me shows normal sinus rhythm rate of 64 normal axis nonspecific ST-T wave changes ____________________________________________  RADIOLOGY  Radiology reads chest x-ray is showing some atelectasis otherwise no acute disease ____________________________________________   PROCEDURES    ____________________________________________   INITIAL IMPRESSION / ASSESSMENT AND PLAN / ED COURSE  Pertinent labs & imaging results that were available during my care of the patient were reviewed by me and considered in my medical decision making (see chart for details).   ____________________________________________   FINAL CLINICAL IMPRESSION(S) / ED DIAGNOSES  Final diagnoses:  Chest pain, unspecified chest pain type  Dyspnea      NEW MEDICATIONS STARTED DURING THIS VISIT:  New Prescriptions   No medications on file     Note:  This document was prepared using Dragon voice recognition software and may include unintentional dictation errors.    Nena Polio, MD 04/03/16 [phone]

## 2016-04-03 NOTE — H&P (Signed)
[place] [facility] [place] NAME: Lawrence [name]    MR#:  [id number]  DATE OF BIRTH:  08-24-1956  DATE OF ADMISSION:  04/03/2016  PRIMARY CARE PHYSICIAN: [name], MD   REQUESTING/REFERRING PHYSICIAN:   CHIEF COMPLAINT:   Chief Complaint  Patient presents with  . Chest Pain    HISTORY OF PRESENT ILLNESS: Lawrence [name]  is a 60 y.o. male with a known history of Coronary artery disease, coronary artery bypass grafting 2, essential hypertension, COPD, chronic respiratory failure on 2 L of oxygen nasal cannula at home, who presents to have full with complaints of chest pain. Pain is described as tightness in the left side of the chest coming on whenever he starts walking or going up stairs. Pain is as as bad as 8-10 out of 10 by intensity and it would last approximately 15-30 minutes with no radiation, intermittent with no artery bleeding or other aggravating factors. Does admit that he is been very short of breath, especially on exertion and with these chest pains, worsening over the period of 3 days.  On arrival to emergency room, patient was noted to be hypotensive and hospitalist services were contacted for admission.   PAST MEDICAL HISTORY:   Past Medical History  Diagnosis Date  . CAD (coronary artery disease)     a. 2002 CABGx2 (LIMA->LAD, VG->VG->OM1);  b. 09/2012 DES->OM;  c. 03/2015 PTCA of LAD [facility]) in setting of atretic LIMA; d. 05/2015 Cath [facility]): nonobs dzs; e. 06/2015 Cath ([facility]): LM nl, LAD 45p/d ISR, 50d, D1/2 small, LCX 50p/d ISR, OM1 70ost, 30 ISR, VG->OM1 50ost, 74m LIMA->LAD 99p/d - atretic, RCA dom, nl; f.cath 10/16: 40-50%(FFR 0.90) pLAD, 75% (FFR 0.77) mLAD s/p PCI/DES, oRCA 40% (FFR0.95)  . Essential hypertension   . COPD (chronic obstructive pulmonary disease) (HSunol     a. Chronic bronchitis and emphysema.  . Type II diabetes mellitus (H[place]   . Asthma   . Celiac disease   . Chronic diastolic CHF (congestive heart  failure) (HRosendale Hamlet     a. 06/2009 Echo: EF 60-65%, Gr 1 DD, triv AI, mildly dil LA, nl RV.  .Marland KitchenAnxiety   . History of tobacco abuse     a. Quit 2014.  .Marland KitchenPSVT (paroxysmal supraventricular tachycardia) (HAnn Arbor     a. 10/2012 Noted on Zio Patch.  . Dysrhythmia   . A-fib ([facility]     PAST SURGICAL HISTORY: Past Surgical History  Procedure Laterality Date  . Vascular surgery    . Bypass graft    . Tonsillectomy    . Cholecystectomy    . Cardiac catheterization N/A 07/12/2015    rocedure: Left Heart Cath and Cors/Grafts Angiography;  Surgeon: HBelva Crome MD;  [facility];  Service: Cardiovascular;  Laterality: N/A;  . Esophageal dilation    . Cardiac catheterization Right 10/07/2015    Procedure: Left Heart Cath and Cors/Grafts Angiography;  Surgeon: SDionisio David MD;  [facility];  Service: Cardiovascular;  Laterality: Right;    SOCIAL HISTORY:  Social History  Substance Use Topics  . Smoking status: Former Smoker    Quit date: 04/22/2013  . Smokeless tobacco: Never Used  . Alcohol Use: No     Comment: remotely quit alcohol use. Hx of heavy alcohol use.    FAMILY HISTORY:  Family History  Problem Relation Age of Onset  . Heart attack Mother   . Cirrhosis Mother   . Diabetes  Mother   . Depression Mother   . Heart disease Mother   . Heart attack Father   . Diabetes Father   . Depression Father   . Heart disease Father   . Parkinson's disease Brother     DRUG ALLERGIES:  Allergies  Allergen Reactions  . Demerol [Meperidine] Hives  . Prednisone Other (See Comments) and Hypertension    Pt states that this medication puts him in A-fib   . Sulfa Antibiotics Hives  . Albuterol Sulfate [Albuterol] Palpitations and Other (See Comments)    Pt currently uses this medication.    . Morphine Sulfate Nausea And Vomiting, Rash and Other (See Comments)    Pt states that he is only allergic to the tablet form of this medication.      Review of  Systems  Constitutional: Negative for fever, chills, weight loss and malaise/fatigue.  HENT: Negative for congestion.   Eyes: Negative for blurred vision and double vision.  Respiratory: Positive for cough, shortness of breath and wheezing. Negative for sputum production.   Cardiovascular: Positive for chest pain, palpitations and leg swelling. Negative for orthopnea and PND.  Gastrointestinal: Positive for nausea. Negative for vomiting, abdominal pain, diarrhea, constipation, blood in stool and melena.  Genitourinary: Negative for dysuria, urgency, frequency and hematuria.  Musculoskeletal: Negative for falls.  Skin: Negative for rash.  Neurological: Negative for dizziness and weakness.  Psychiatric/Behavioral: Negative for depression and memory loss. The patient is not nervous/anxious.     MEDICATIONS AT HOME:  Prior to Admission medications   Medication Sig Start Date End Date Taking? Authorizing Provider  albuterol (PROVENTIL HFA;VENTOLIN HFA) 108 (90 Base) MCG/ACT inhaler Inhale 4-6 puffs into the lungs every 4 (four) hours as needed for wheezing or shortness of breath.   Yes Historical Provider, MD  allopurinol (ZYLOPRIM) 100 MG tablet Take 300 mg by mouth 2 (two) times daily.   Yes Historical Provider, MD  ALPRAZolam Duanne Moron) 1 MG tablet Take 0.5-1 tablets (0.5-1 mg total) by mouth 3 (three) times daily as needed for anxiety. 10/28/15  Yes Birdie Sons, MD  amLODipine (NORVASC) 10 MG tablet Take 10 mg by mouth daily.   Yes Historical Provider, MD  apixaban (ELIQUIS) 5 MG TABS tablet Take 5 mg by mouth 2 (two) times daily.   Yes Historical Provider, MD  atorvastatin (LIPITOR) 80 MG tablet Take 1 tablet (80 mg total) by mouth at bedtime. 04/25/15  Yes Birdie Sons, MD  cetirizine (ZYRTEC) 10 MG tablet Take 10 mg by mouth at bedtime.   Yes Historical Provider, MD  clopidogrel (PLAVIX) 75 MG tablet Take 1 tablet (75 mg total) by mouth daily. 10/08/15  Yes Nicholes Mango, MD   cyclobenzaprine (FLEXERIL) 10 MG tablet Take 1 tablet (10 mg total) by mouth every 8 (eight) hours as needed for muscle spasms. 01/08/16  Yes Birdie Sons, MD  fluticasone furoate-vilanterol (BREO ELLIPTA) 100-25 MCG/INH AEPB Inhale 1 puff into the lungs daily.    Yes Historical Provider, MD  furosemide (LASIX) 80 MG tablet Take 80 mg by mouth 2 (two) times daily.   Yes Historical Provider, MD  isosorbide mononitrate (IMDUR) 60 MG 24 hr tablet Take 120 mg by mouth daily.    Yes Historical Provider, MD  lansoprazole (PREVACID) 30 MG capsule Take 30 mg by mouth daily.    Yes Historical Provider, MD  lisinopril (PRINIVIL,ZESTRIL) 10 MG tablet Take 10 mg by mouth daily.   Yes Historical Provider, MD  magnesium oxide (MAG-OX) 400 (241.3  Mg) MG tablet Take 400 mg by mouth daily.   Yes Historical Provider, MD  metFORMIN (GLUCOPHAGE) 500 MG tablet Take 500 mg by mouth daily with breakfast.    Yes Historical Provider, MD  metoprolol tartrate (LOPRESSOR) 25 MG tablet Take 50 mg by mouth 2 (two) times daily.    Yes Historical Provider, MD  nitroGLYCERIN (NITROSTAT) 0.4 MG SL tablet Place 0.4 mg under the tongue every 5 (five) minutes as needed for chest pain.   Yes Historical Provider, MD  omega-3 acid ethyl esters (LOVAZA) 1 g capsule Take 4 g by mouth daily.   Yes Historical Provider, MD  oxyCODONE-acetaminophen (PERCOCET) 10-325 MG tablet Take 1 tablet by mouth every 4 (four) hours as needed for pain. 03/21/16  Yes Birdie Sons, MD  potassium chloride (K-DUR,KLOR-CON) 10 MEQ tablet Take 10 mEq by mouth daily.   Yes Historical Provider, MD  ranolazine (RANEXA) 500 MG 12 hr tablet Take 2 tablets (1,000 mg total) by mouth 2 (two) times daily. 03/03/16  Yes Birdie Sons, MD  sotalol (BETAPACE) 120 MG tablet Take 120 mg by mouth 2 (two) times daily.    Yes Historical Provider, MD  tamsulosin (FLOMAX) 0.4 MG CAPS capsule Take 0.4 mg by mouth daily after breakfast.    Yes Historical Provider, MD  tiotropium  (SPIRIVA) 18 MCG inhalation capsule Place 18 mcg into inhaler and inhale daily.   Yes Historical Provider, MD      PHYSICAL EXAMINATION:   VITAL SIGNS: Blood pressure 97/60, pulse 61, temperature 98.1 F (36.7 C), temperature source Oral, resp. rate 20, height 5' 7"  (1.702 m), weight 108.863 kg (240 lb), SpO2 94 %.  GENERAL:  60 y.o.-year-old patient lying in the bed In no significant distress at present, relatively comfortable, however, requests some pain medications for chest pains he's been having.  EYES: Pupils equal, round, reactive to light and accommodation. No scleral icterus. Extraocular muscles intact.  HEENT: Head atraumatic, normocephalic. Oropharynx and nasopharynx clear.  NECK:  Supple, no jugular venous distention. No thyroid enlargement, no tenderness.  LUNGS: Normal breath sounds bilaterally, no wheezing, rales,rhonchi or crepitation. No use of accessory muscles of respiration.  CARDIOVASCULAR: S1, S2 normal. No murmurs, rubs, or gallops. Distant ABDOMEN: Soft, protuberant, nontender, nondistended. Bowel sounds present. No organomegaly or mass.  EXTREMITIES: 1+ lower extremity and pedal edema, no cyanosis, or clubbing.  NEUROLOGIC: Cranial nerves II through XII are intact. Muscle strength 5/5 in all extremities. Sensation intact. Gait not checked.  PSYCHIATRIC: The patient is alert and oriented x 3.  SKIN: No obvious rash, lesion, or ulcer.   LABORATORY PANEL:   CBC  Recent Labs Lab 04/03/16 1336  WBC 7.3  HGB 10.3*  HCT 31.8*  PLT 212  MCV 79.0*  MCH 25.5*  MCHC 32.3  RDW 19.6*   ------------------------------------------------------------------------------------------------------------------  Chemistries   Recent Labs Lab 04/03/16 1336  NA 137  K 4.1  CL 101  CO2 29  GLUCOSE 124*  BUN 21*  CREATININE 1.57*  CALCIUM 8.8*   ------------------------------------------------------------------------------------------------------------------  Cardiac  Enzymes  Recent Labs Lab 04/03/16 1336 04/03/16 1623  TROPONINI <0.03 <0.03   ------------------------------------------------------------------------------------------------------------------  RADIOLOGY: Dg Chest 2 View  04/03/2016  CLINICAL DATA:  Chest pain for 1 day EXAM: CHEST  2 VIEW COMPARISON:  Mar 28, 2016 FINDINGS: There is mild left base atelectasis. Lungs elsewhere clear. Heart size and pulmonary vascularity are normal. No adenopathy. Patient is status post internal mammary bypass grafting. There is calcification in each carotid artery.  No pneumothorax. IMPRESSION: Mild left base atelectasis. No edema or consolidation. Stable cardiac silhouette. Calcification noted in each carotid artery. Electronically Signed   By: Lowella Grip III M.D.   On: 04/03/2016 14:59    EKG: Orders placed or performed during the hospital encounter of 04/03/16  . EKG 12-Lead  . EKG 12-Lead  . ED EKG within 10 minutes  . ED EKG within 10 minutes  EKG in the emergency room revealed normal sinus rhythm at 64 bpm, normal axis, septal infarct age undetermined, nonspecific ST-T changes  IMPRESSION AND PLAN:  Principal Problem:   Unstable angina (HCC) Active Problems:   Dyspnea   Hypotension   Acute on chronic renal insufficiency (HCC)   Anemia #1. Unstable angina. Admit patient to medical floor, continue him on Betapace, unable to use the nitroglycerin due to hypotension, continue aspirin, Plavix, follow cardiac enzymes 3, good cardiologist involved for further recommendations #2. Hypotension, hold diuretics, all blood pressure medications, but doesn't efface, initiate patient on low rate IV fluids #3. Acute on chronic renal insufficiency, initiate IV fluids, follow creatinine tomorrow morning #4. Dyspnea due to underlying coronary artery disease, obesity, COPD, follow with therapy, follow for fluid overload #5. Anemia. Get a Hemoccult as patient is on anticoagulation with Eliquis   All the  records are reviewed and case discussed with ED provider. Management plans discussed with the patient, family and they are in agreement.  CODE STATUS: Code Status History    Date Active Date Inactive Code Status Order ID Comments User Context   03/26/2016 10:24 PM 03/28/2016  6:00 PM Full Code [id number]  Lance Coon, MD Inpatient   10/07/2015  9:36 AM 10/07/2015  6:30 PM Full Code [id number]  Dionisio David, MD Inpatient   10/07/2015  8:26 AM 10/07/2015  9:36 AM Full Code [id number]  Dionisio David, MD Inpatient   10/05/2015  7:29 PM 10/07/2015  8:26 AM Full Code [id number]  Idelle Crouch, MD Inpatient   09/12/2015  9:29 PM 09/14/2015  2:25 PM Full Code [id number]  Demetrios Loll, MD Inpatient   08/01/2015  5:21 AM 08/02/2015  1:46 PM Full Code [id number]  Lance Coon, MD Inpatient   07/12/2015  3:24 PM 07/13/2015  8:17 PM Full Code [id number]  Belva Crome, MD Inpatient   07/11/2015  8:00 PM 07/12/2015  3:24 PM Full Code [id number]  Lonn Georgia, PA-C Inpatient   06/26/2015  5:43 AM 06/27/2015  3:30 PM Full Code [id number]  Juluis Mire, MD Inpatient       TOTAL TIME TAKING CARE OF THIS PATIENT: 55 minutes.  Discussed this patient's wife, all questions were answered, patient and patient's wife voiced understanding  Loretto Belinsky M.D on 04/03/2016 at 6:49 PM  Between 7am to 6pm - Pager - [phone] After 6pm go to [email or web address] - password [email or web address] [facility]  Office  [phone]  CC: Primary care physician; [name], MD

## 2016-04-03 NOTE — ED Notes (Signed)
Arrives with c/o chest pain, onset this morning.  Patient also has had a 3 week history of generalized weakness and recent inpatient hospital admission for CHF exacerbation (discharged Tuesday 5/16).  Patient's lasix dose decreased from 80 mg daily to 40 mg daily yesterday, due to c/o generalized weakness.  PTA patient took a long acting NTG, plavix, and eloquis at home.

## 2016-04-04 DIAGNOSIS — I200 Unstable angina: Secondary | ICD-10-CM | POA: Diagnosis not present

## 2016-04-04 DIAGNOSIS — N289 Disorder of kidney and ureter, unspecified: Secondary | ICD-10-CM | POA: Diagnosis not present

## 2016-04-04 DIAGNOSIS — I959 Hypotension, unspecified: Secondary | ICD-10-CM | POA: Diagnosis not present

## 2016-04-04 LAB — BASIC METABOLIC PANEL
Anion gap: 4 — ABNORMAL LOW (ref 5–15)
BUN: 20 mg/dL (ref 6–20)
CO2: 32 mmol/L (ref 22–32)
Calcium: 8.7 mg/dL — ABNORMAL LOW (ref 8.9–10.3)
Chloride: 102 mmol/L (ref 101–111)
Creatinine, Ser: 1.29 mg/dL — ABNORMAL HIGH (ref 0.61–1.24)
GFR calc Af Amer: >60 mL/min (ref >60)
GFR calc non Af Amer: 58 mL/min — ABNORMAL LOW (ref >60)
Glucose, Bld: 122 mg/dL — ABNORMAL HIGH (ref 65–99)
Potassium: 3.7 mmol/L (ref 3.5–5.1)
Sodium: 138 mmol/L (ref 135–145)

## 2016-04-04 LAB — CBC
HCT: 31.9 % — ABNORMAL LOW (ref 40.0–52.0)
Hemoglobin: 10.1 g/dL — ABNORMAL LOW (ref 13.0–18.0)
MCH: 25 pg — ABNORMAL LOW (ref 26.0–34.0)
MCHC: 31.8 g/dL — ABNORMAL LOW (ref 32.0–36.0)
MCV: 78.6 fL — ABNORMAL LOW (ref 80.0–100.0)
Platelets: 192 10*3/uL (ref 150–440)
RBC: 4.06 MIL/uL — ABNORMAL LOW (ref 4.40–5.90)
RDW: 20.1 % — ABNORMAL HIGH (ref 11.5–14.5)
WBC: 5 10*3/uL (ref 3.8–10.6)

## 2016-04-04 LAB — TROPONIN I
Troponin I: <0.03 ng/mL (ref <0.031)
Troponin I: <0.03 ng/mL (ref <0.031)

## 2016-04-04 LAB — GLUCOSE, CAPILLARY
Glucose-Capillary: 137 mg/dL — ABNORMAL HIGH (ref 65–99)
Glucose-Capillary: 125 mg/dL — ABNORMAL HIGH (ref 65–99)
Glucose-Capillary: 92 mg/dL (ref 65–99)

## 2016-04-04 LAB — HEMOGLOBIN A1C: Hgb A1c MFr Bld: 6.3 % — ABNORMAL HIGH (ref 4.0–6.0)

## 2016-04-04 MED ORDER — SODIUM CHLORIDE 0.9% FLUSH: 3.0000 mL | INTRAVENOUS | Status: DC | PRN

## 2016-04-04 MED ORDER — SODIUM CHLORIDE 0.9% FLUSH
3.0000 mL | Freq: Two times a day (BID) | INTRAVENOUS | Status: DC
  Administered 2016-04-06: 3 mL via INTRAVENOUS

## 2016-04-04 MED ORDER — SODIUM CHLORIDE 0.9 % WEIGHT BASED INFUSION: 3.0000 mL/kg/h | INTRAVENOUS | Status: AC

## 2016-04-04 MED ORDER — ASPIRIN 81 MG PO CHEW
81.0000 mg | CHEWABLE_TABLET | ORAL | Status: DC
Start: 2016-04-05 — End: 2016-04-06

## 2016-04-04 MED ORDER — SODIUM CHLORIDE 0.9 % WEIGHT BASED INFUSION
1.0000 mL/kg/h | INTRAVENOUS | Status: DC
  Administered 2016-04-06: 1 mL/kg/h via INTRAVENOUS

## 2016-04-04 MED ORDER — SODIUM CHLORIDE 0.9 % IV SOLN: 250.0000 mL | INTRAVENOUS | Status: DC | PRN

## 2016-04-04 NOTE — Consult Note (Signed)
Reason for Consult: Unstable angina coronary disease Referring Physician: Dr. Jeris Penta primary physician Cardiologist Dr. Kallie Edward is an 60 y.o. male.  HPI: Patient presents with anginal symptoms midsternal chest pain or shortness of breath. History of coronary bypass surgery in the past hypertension COPD diabetes has a persistent history of restrictive failure on 2 L nasal cannula at home. Patient was recently in the hospital treated with congestion. Patient states he started having chest pain and angina over the last 2-3 days ago progressively worse and came to emergency room subsequently admitted. Last cardiac catheter was November patient states his last stents were probably about 6 months ago. Patient recently saw Dr. [name] and he was maintained on medical management.  Past Medical History  Diagnosis Date  . CAD (coronary artery disease)     a. 2002 CABGx2 (LIMA->LAD, VG->VG->OM1);  b. 09/2012 DES->OM;  c. 03/2015 PTCA of LAD [facility]) in setting of atretic LIMA; d. 05/2015 Cath [facility]): nonobs dzs; e. 06/2015 Cath ([facility]): LM nl, LAD 45p/d ISR, 50d, D1/2 small, LCX 50p/d ISR, OM1 70ost, 30 ISR, VG->OM1 50ost, 82m LIMA->LAD 99p/d - atretic, RCA dom, nl; f.cath 10/16: 40-50%(FFR 0.90) pLAD, 75% (FFR 0.77) mLAD s/p PCI/DES, oRCA 40% (FFR0.95)  . Essential hypertension   . COPD (chronic obstructive pulmonary disease) ([place]     a. Chronic bronchitis and emphysema.  . Type II diabetes mellitus ([place] [place]   . Asthma   . Celiac disease   . Chronic diastolic CHF (congestive heart failure) ([place]     a. 06/2009 Echo: EF 60-65%, Gr 1 DD, triv AI, mildly dil LA, nl RV.  .Marland KitchenAnxiety   . History of tobacco abuse     a. Quit 2014.  .Marland KitchenPSVT (paroxysmal supraventricular tachycardia) ([place]     a. 10/2012 Noted on Zio Patch.  . Dysrhythmia   . A-fib ([facility]     Past Surgical History  Procedure Laterality Date  . Vascular surgery    . Bypass graft    . Tonsillectomy    .  Cholecystectomy    . Cardiac catheterization N/A 07/12/2015    rocedure: Left Heart Cath and Cors/Grafts Angiography;  Surgeon: HBelva Crome MD;  [facility];  Service: Cardiovascular;  Laterality: N/A;  . Esophageal dilation    . Cardiac catheterization Right 10/07/2015    Procedure: Left Heart Cath and Cors/Grafts Angiography;  Surgeon: SDionisio David MD;  [facility];  Service: Cardiovascular;  Laterality: Right;    Family History  Problem Relation Age of Onset  . Heart attack Mother   . Cirrhosis Mother   . Diabetes Mother   . Depression Mother   . Heart disease Mother   . Heart attack Father   . Diabetes Father   . Depression Father   . Heart disease Father   . Parkinson's disease Brother     Social History:  reports that he quit smoking about 2 years ago. He has never used smokeless tobacco. He reports that he does not drink alcohol or use illicit drugs.  Allergies:  Allergies  Allergen Reactions  . Demerol [Meperidine] Hives  . Prednisone Other (See Comments) and Hypertension    Pt states that this medication puts him in A-fib   . Sulfa Antibiotics Hives  . Albuterol Sulfate [Albuterol] Palpitations and Other (See Comments)    Pt currently uses this medication.    . Morphine Sulfate Nausea And Vomiting, Rash and Other (See Comments)  Pt states that he is only allergic to the tablet form of this medication.      Medications: I have reviewed the patient's current medications.  Results for orders placed or performed during the hospital encounter of 04/03/16 (from the past 48 hour(s))  Basic metabolic panel     Status: Abnormal   Collection Time: 04/03/16  1:36 PM  Result Value Ref Range   Sodium 137 135 - 145 mmol/L   Potassium 4.1 3.5 - 5.1 mmol/L   Chloride 101 101 - 111 mmol/L   CO2 29 22 - 32 mmol/L   Glucose, Bld 124 (H) 65 - 99 mg/dL   BUN 21 (H) 6 - 20 mg/dL   Creatinine, Ser 1.57 (H) 0.61 - 1.24 mg/dL   Calcium 8.8  (L) 8.9 - 10.3 mg/dL   GFR calc non Af Amer 46 (L) >60 mL/min   GFR calc Af Amer 53 (L) >60 mL/min    Comment: (NOTE) The eGFR has been calculated using the CKD EPI equation. This calculation has not been validated in all clinical situations. eGFR's persistently <60 mL/min signify possible Chronic Kidney Disease.    Anion gap 7 5 - 15  CBC     Status: Abnormal   Collection Time: 04/03/16  1:36 PM  Result Value Ref Range   WBC 7.3 3.8 - 10.6 K/uL   RBC 4.03 (L) 4.40 - 5.90 MIL/uL   Hemoglobin 10.3 (L) 13.0 - 18.0 g/dL   HCT 31.8 (L) 40.0 - 52.0 %   MCV 79.0 (L) 80.0 - 100.0 fL   MCH 25.5 (L) 26.0 - 34.0 pg   MCHC 32.3 32.0 - 36.0 g/dL   RDW 19.6 (H) 11.5 - 14.5 %   Platelets 212 150 - 440 K/uL  Troponin I     Status: None   Collection Time: 04/03/16  1:36 PM  Result Value Ref Range   Troponin I <0.03 <0.031 ng/mL    Comment:        NO INDICATION OF MYOCARDIAL INJURY.   Protime-INR (order if Patient is taking Coumadin / Warfarin)     Status: Abnormal   Collection Time: 04/03/16  1:36 PM  Result Value Ref Range   Prothrombin Time 17.7 (H) 11.4 - 15.0 seconds   INR 1.45   Troponin I     Status: None   Collection Time: 04/03/16  4:23 PM  Result Value Ref Range   Troponin I <0.03 <0.031 ng/mL    Comment:        NO INDICATION OF MYOCARDIAL INJURY.   Hemoglobin A1c     Status: Abnormal   Collection Time: 04/03/16  9:25 PM  Result Value Ref Range   Hgb A1c MFr Bld 6.3 (H) 4.0 - 6.0 %  Troponin I     Status: None   Collection Time: 04/03/16  9:25 PM  Result Value Ref Range   Troponin I <0.03 <0.031 ng/mL    Comment:        NO INDICATION OF MYOCARDIAL INJURY.   Glucose, capillary     Status: Abnormal   Collection Time: 04/03/16  9:27 PM  Result Value Ref Range   Glucose-Capillary 131 (H) 65 - 99 mg/dL  Troponin I     Status: None   Collection Time: 04/04/16  2:46 AM  Result Value Ref Range   Troponin I <0.03 <0.031 ng/mL    Comment:        NO INDICATION  OF MYOCARDIAL INJURY.   Glucose, capillary  Status: Abnormal   Collection Time: 04/04/16  7:43 AM  Result Value Ref Range   Glucose-Capillary 137 (H) 65 - 99 mg/dL  Troponin I     Status: None   Collection Time: 04/04/16  8:39 AM  Result Value Ref Range   Troponin I <0.03 <0.031 ng/mL    Comment:        NO INDICATION OF MYOCARDIAL INJURY.   Basic metabolic panel     Status: Abnormal   Collection Time: 04/04/16  8:39 AM  Result Value Ref Range   Sodium 138 135 - 145 mmol/L   Potassium 3.7 3.5 - 5.1 mmol/L   Chloride 102 101 - 111 mmol/L   CO2 32 22 - 32 mmol/L   Glucose, Bld 122 (H) 65 - 99 mg/dL   BUN 20 6 - 20 mg/dL   Creatinine, Ser 1.29 (H) 0.61 - 1.24 mg/dL   Calcium 8.7 (L) 8.9 - 10.3 mg/dL   GFR calc non Af Amer 58 (L) >60 mL/min   GFR calc Af Amer >60 >60 mL/min    Comment: (NOTE) The eGFR has been calculated using the CKD EPI equation. This calculation has not been validated in all clinical situations. eGFR's persistently <60 mL/min signify possible Chronic Kidney Disease.    Anion gap 4 (L) 5 - 15  CBC     Status: Abnormal   Collection Time: 04/04/16  8:39 AM  Result Value Ref Range   WBC 5.0 3.8 - 10.6 K/uL   RBC 4.06 (L) 4.40 - 5.90 MIL/uL   Hemoglobin 10.1 (L) 13.0 - 18.0 g/dL   HCT 31.9 (L) 40.0 - 52.0 %   MCV 78.6 (L) 80.0 - 100.0 fL   MCH 25.0 (L) 26.0 - 34.0 pg   MCHC 31.8 (L) 32.0 - 36.0 g/dL   RDW 20.1 (H) 11.5 - 14.5 %   Platelets 192 150 - 440 K/uL  Glucose, capillary     Status: Abnormal   Collection Time: 04/04/16 12:12 PM  Result Value Ref Range   Glucose-Capillary 125 (H) 65 - 99 mg/dL    Dg Chest 2 View  04/03/2016  CLINICAL DATA:  Chest pain for 1 day EXAM: CHEST  2 VIEW COMPARISON:  Mar 28, 2016 FINDINGS: There is mild left base atelectasis. Lungs elsewhere clear. Heart size and pulmonary vascularity are normal. No adenopathy. Patient is status post internal mammary bypass grafting. There is calcification in each carotid artery. No  pneumothorax. IMPRESSION: Mild left base atelectasis. No edema or consolidation. Stable cardiac silhouette. Calcification noted in each carotid artery. Electronically Signed   By: Lowella Grip III M.D.   On: 04/03/2016 14:59    Review of Systems  Constitutional: Positive for malaise/fatigue.  HENT: Positive for congestion.   Eyes: Negative.   Respiratory: Positive for shortness of breath.   Cardiovascular: Positive for chest pain, palpitations, orthopnea and leg swelling.  Gastrointestinal: Negative.   Genitourinary: Negative.   Musculoskeletal: Negative.   Skin: Negative.   Neurological: Positive for weakness.  Endo/Heme/Allergies: Negative.   Psychiatric/Behavioral: Negative.    Blood pressure 105/63, pulse 54, temperature 97.5 F (36.4 C), temperature source Oral, resp. rate 16, height 5' 7"  (1.702 m), weight 109.453 kg (241 lb 4.8 oz), SpO2 92 %. Physical Exam  Nursing note and vitals reviewed. Constitutional: He is oriented to person, place, and time. He appears well-developed and well-nourished.  HENT:  Head: Normocephalic and atraumatic.  Eyes: Conjunctivae and EOM are normal. Pupils are equal, round, and reactive to light.  Neck: Normal range of motion. Neck supple.  Cardiovascular: Normal rate and regular rhythm.   Murmur heard. Respiratory: Effort normal and breath sounds normal.  GI: Soft. Bowel sounds are normal.  Musculoskeletal: Normal range of motion.  Neurological: He is alert and oriented to person, place, and time. He has normal reflexes.  Skin: Skin is warm and dry.  Psychiatric: He has a normal mood and affect.    Assessment/Plan: Unstable angina Coronary disease Hypertension Diabetes GERD Hyperlipidemia Atrial fibrillation paroxysmal Obstructive sleep apnea COPD Anxiety . PLAN Agree with admission to telemetry Recommend holding Eliquis for possible cardiac Consider cardiac catheter including left heart grafts prior to discharge Continue  Imdur and Ranexa for anginal symptoms Short-term anticoagulation probably with heparin Maintain diabetes management with insulin NovoLog Glucophage Atrial fibrillation controlled with sotalol discontinue anticoagulation Continue inhalers for COPD and shortness of breath Continue Lipitor for lipid management Maintain aspirin and Plavix We'll hopefully proceed with cardiac catheter on Monday Patient appears to be on maximal medical therapy at this point except for beta blocker  Keoki Mchargue D. 04/04/2016, 1:59 PM

## 2016-04-04 NOTE — Progress Notes (Signed)
Lawrence [name] NAME: Lawrence [name]    MR#:  [id number]  DATE OF BIRTH:  August 15, 1956  SUBJECTIVE:  CHIEF COMPLAINT:   Chief Complaint  Patient presents with  . Chest Pain  Continued having chest pain  REVIEW OF SYSTEMS:  Review of Systems  Constitutional: Negative for fever, weight loss, malaise/fatigue and diaphoresis.  HENT: Negative for ear discharge, ear pain, hearing loss, nosebleeds, sore throat and tinnitus.   Eyes: Negative for blurred vision and pain.  Respiratory: Negative for cough, hemoptysis, shortness of breath and wheezing.   Cardiovascular: Positive for chest pain. Negative for palpitations, orthopnea and leg swelling.  Gastrointestinal: Negative for heartburn, nausea, vomiting, abdominal pain, diarrhea, constipation and blood in stool.  Genitourinary: Negative for dysuria, urgency and frequency.  Musculoskeletal: Negative for myalgias and back pain.  Skin: Negative for itching and rash.  Neurological: Negative for dizziness, tingling, tremors, focal weakness, seizures, weakness and headaches.  Psychiatric/Behavioral: Negative for depression. The patient is not nervous/anxious.    DRUG ALLERGIES:   Allergies  Allergen Reactions  . Demerol [Meperidine] Hives  . Prednisone Other (See Comments) and Hypertension    Pt states that this medication puts him in A-fib   . Sulfa Antibiotics Hives  . Albuterol Sulfate [Albuterol] Palpitations and Other (See Comments)    Pt currently uses this medication.    . Morphine Sulfate Nausea And Vomiting, Rash and Other (See Comments)    Pt states that he is only allergic to the tablet form of this medication.     VITALS:  Blood pressure 105/63, pulse 54, temperature 97.5 F (36.4 C), temperature source Oral, resp. rate 16, height 5' 7"  (1.702 m), weight 109.453 kg (241 lb 4.8 oz), SpO2 92 %. PHYSICAL EXAMINATION:  Physical Exam  Constitutional: He is oriented to person,  place, and time and well-developed, well-nourished, and in no distress.  HENT:  Head: Normocephalic and atraumatic.  Eyes: Conjunctivae and EOM are normal. Pupils are equal, round, and reactive to light.  Neck: Normal range of motion. Neck supple. No tracheal deviation present. No thyromegaly present.  Cardiovascular: Normal rate, regular rhythm and normal heart sounds.   Pulmonary/Chest: Effort normal and breath sounds normal. No respiratory distress. He has no wheezes. He exhibits no tenderness.  Abdominal: Soft. Bowel sounds are normal. He exhibits no distension. There is no tenderness.  Musculoskeletal: Normal range of motion.  Neurological: He is alert and oriented to person, place, and time. No cranial nerve deficit.  Skin: Skin is warm and dry. No rash noted.  Psychiatric: Mood and affect normal.   LABORATORY PANEL:   CBC  Recent Labs Lab 04/04/16 0839  WBC 5.0  HGB 10.1*  HCT 31.9*  PLT 192   ------------------------------------------------------------------------------------------------------------------ Chemistries   Recent Labs Lab 04/04/16 0839  NA 138  K 3.7  CL 102  CO2 32  GLUCOSE 122*  BUN 20  CREATININE 1.29*  CALCIUM 8.7*   RADIOLOGY:  Dg Chest 2 View  04/03/2016  CLINICAL DATA:  Chest pain for 1 day EXAM: CHEST  2 VIEW COMPARISON:  Mar 28, 2016 FINDINGS: There is mild left base atelectasis. Lungs elsewhere clear. Heart size and pulmonary vascularity are normal. No adenopathy. Patient is status post internal mammary bypass grafting. There is calcification in each carotid artery. No pneumothorax. IMPRESSION: Mild left base atelectasis. No edema or consolidation. Stable cardiac silhouette. Calcification noted in each carotid artery. Electronically Signed   By: Lowella Grip III  M.D.   On: 04/03/2016 14:59   ASSESSMENT AND PLAN:  #1. Unstable angina: continue him on Betapace, unable to use the nitroglycerin due to hypotension, continue aspirin, Plavix,  cardiology planning for possible cardiac catheterization on Monday.  Discussed with Dr. Clayborn Bigness #2. Hypotension, hold diuretics, all blood pressure medications, continue low rate IV fluids #3. Acute on chronic renal insufficiency, initiate IV fluids, follow creatinine tomorrow morning #4. Dyspnea due to underlying coronary artery disease, obesity, COPD, follow with therapy, follow for fluid overload #5. Anemia: Stable Hb on Eliquis     All the records are reviewed and case discussed with Care Management/Social Worker. Management plans discussed with the patient, family and they are in agreement.  CODE STATUS: Full code  TOTAL TIME TAKING CARE OF THIS PATIENT: 35 minutes.   More than 50% of the time was spent in counseling/coordination of care: YES  POSSIBLE D/C IN 2-3 DAYS, DEPENDING ON CLINICAL CONDITION and Cardiac evaluation   [facility], Jadore Mcguffin M.D on 04/04/2016 at 1:22 PM  Between 7am to 6pm - Pager - [phone]  After 6pm go to [email or web address] - password [email or web address] [place] [facility]  Office  [phone]  CC: Primary care physician; [name], MD  Note: This dictation was prepared with Dragon dictation along with smaller phrase technology. Any transcriptional errors that result from this process are unintentional.

## 2016-04-05 DIAGNOSIS — J9610 Chronic respiratory failure, unspecified whether with hypoxia or hypercapnia: Secondary | ICD-10-CM | POA: Diagnosis present

## 2016-04-05 DIAGNOSIS — Z9861 Coronary angioplasty status: Secondary | ICD-10-CM | POA: Diagnosis not present

## 2016-04-05 DIAGNOSIS — K219 Gastro-esophageal reflux disease without esophagitis: Secondary | ICD-10-CM | POA: Diagnosis present

## 2016-04-05 DIAGNOSIS — I25110 Atherosclerotic heart disease of native coronary artery with unstable angina pectoris: Secondary | ICD-10-CM | POA: Diagnosis present

## 2016-04-05 DIAGNOSIS — N183 Chronic kidney disease, stage 3 (moderate): Secondary | ICD-10-CM | POA: Diagnosis present

## 2016-04-05 DIAGNOSIS — Z885 Allergy status to narcotic agent status: Secondary | ICD-10-CM | POA: Diagnosis not present

## 2016-04-05 DIAGNOSIS — Z833 Family history of diabetes mellitus: Secondary | ICD-10-CM | POA: Diagnosis not present

## 2016-04-05 DIAGNOSIS — R0600 Dyspnea, unspecified: Secondary | ICD-10-CM | POA: Diagnosis present

## 2016-04-05 DIAGNOSIS — Z87891 Personal history of nicotine dependence: Secondary | ICD-10-CM | POA: Diagnosis not present

## 2016-04-05 DIAGNOSIS — I130 Hypertensive heart and chronic kidney disease with heart failure and stage 1 through stage 4 chronic kidney disease, or unspecified chronic kidney disease: Secondary | ICD-10-CM | POA: Diagnosis present

## 2016-04-05 DIAGNOSIS — Z7901 Long term (current) use of anticoagulants: Secondary | ICD-10-CM | POA: Diagnosis not present

## 2016-04-05 DIAGNOSIS — I5032 Chronic diastolic (congestive) heart failure: Secondary | ICD-10-CM | POA: Diagnosis present

## 2016-04-05 DIAGNOSIS — K900 Celiac disease: Secondary | ICD-10-CM | POA: Diagnosis present

## 2016-04-05 DIAGNOSIS — Z9981 Dependence on supplemental oxygen: Secondary | ICD-10-CM | POA: Diagnosis not present

## 2016-04-05 DIAGNOSIS — Z951 Presence of aortocoronary bypass graft: Secondary | ICD-10-CM | POA: Diagnosis not present

## 2016-04-05 DIAGNOSIS — D649 Anemia, unspecified: Secondary | ICD-10-CM | POA: Diagnosis present

## 2016-04-05 DIAGNOSIS — J449 Chronic obstructive pulmonary disease, unspecified: Secondary | ICD-10-CM | POA: Diagnosis present

## 2016-04-05 DIAGNOSIS — E785 Hyperlipidemia, unspecified: Secondary | ICD-10-CM | POA: Diagnosis present

## 2016-04-05 DIAGNOSIS — Z8249 Family history of ischemic heart disease and other diseases of the circulatory system: Secondary | ICD-10-CM | POA: Diagnosis not present

## 2016-04-05 DIAGNOSIS — E669 Obesity, unspecified: Secondary | ICD-10-CM | POA: Diagnosis present

## 2016-04-05 DIAGNOSIS — F419 Anxiety disorder, unspecified: Secondary | ICD-10-CM | POA: Diagnosis present

## 2016-04-05 DIAGNOSIS — J45909 Unspecified asthma, uncomplicated: Secondary | ICD-10-CM | POA: Diagnosis present

## 2016-04-05 DIAGNOSIS — I480 Paroxysmal atrial fibrillation: Secondary | ICD-10-CM | POA: Diagnosis present

## 2016-04-05 DIAGNOSIS — I959 Hypotension, unspecified: Secondary | ICD-10-CM | POA: Diagnosis present

## 2016-04-05 DIAGNOSIS — G4733 Obstructive sleep apnea (adult) (pediatric): Secondary | ICD-10-CM | POA: Diagnosis present

## 2016-04-05 DIAGNOSIS — Z882 Allergy status to sulfonamides status: Secondary | ICD-10-CM | POA: Diagnosis not present

## 2016-04-05 DIAGNOSIS — E1122 Type 2 diabetes mellitus with diabetic chronic kidney disease: Secondary | ICD-10-CM | POA: Diagnosis present

## 2016-04-05 DIAGNOSIS — Z888 Allergy status to other drugs, medicaments and biological substances status: Secondary | ICD-10-CM | POA: Diagnosis not present

## 2016-04-05 DIAGNOSIS — Z6837 Body mass index (BMI) 37.0-37.9, adult: Secondary | ICD-10-CM | POA: Diagnosis not present

## 2016-04-05 LAB — BASIC METABOLIC PANEL
Anion gap: 4 — ABNORMAL LOW (ref 5–15)
BUN: 17 mg/dL (ref 6–20)
CO2: 32 mmol/L (ref 22–32)
Calcium: 8.7 mg/dL — ABNORMAL LOW (ref 8.9–10.3)
Chloride: 101 mmol/L (ref 101–111)
Creatinine, Ser: 1.07 mg/dL (ref 0.61–1.24)
GFR calc Af Amer: >60 mL/min (ref >60)
GFR calc non Af Amer: >60 mL/min (ref >60)
Glucose, Bld: 119 mg/dL — ABNORMAL HIGH (ref 65–99)
Potassium: 4.3 mmol/L (ref 3.5–5.1)
Sodium: 137 mmol/L (ref 135–145)

## 2016-04-05 LAB — CBC
HCT: 31 % — ABNORMAL LOW (ref 40.0–52.0)
Hemoglobin: 9.8 g/dL — ABNORMAL LOW (ref 13.0–18.0)
MCH: 25 pg — ABNORMAL LOW (ref 26.0–34.0)
MCHC: 31.5 g/dL — ABNORMAL LOW (ref 32.0–36.0)
MCV: 79.2 fL — ABNORMAL LOW (ref 80.0–100.0)
Platelets: 197 10*3/uL (ref 150–440)
RBC: 3.92 MIL/uL — ABNORMAL LOW (ref 4.40–5.90)
RDW: 20 % — ABNORMAL HIGH (ref 11.5–14.5)
WBC: 5.2 10*3/uL (ref 3.8–10.6)

## 2016-04-05 LAB — GLUCOSE, CAPILLARY
Glucose-Capillary: 114 mg/dL — ABNORMAL HIGH (ref 65–99)
Glucose-Capillary: 106 mg/dL — ABNORMAL HIGH (ref 65–99)
Glucose-Capillary: 103 mg/dL — ABNORMAL HIGH (ref 65–99)
Glucose-Capillary: 94 mg/dL (ref 65–99)
Glucose-Capillary: 117 mg/dL — ABNORMAL HIGH (ref 65–99)

## 2016-04-05 NOTE — Progress Notes (Signed)
[place] [facility] [place] NAME: Lawrence [name]    MR#:  [id number]  DATE OF BIRTH:  Jan 29, 1956  SUBJECTIVE:  CHIEF COMPLAINT:   Chief Complaint  Patient presents with  . Chest Pain  Continued having chest pain, cath tomorrow  REVIEW OF SYSTEMS:  Review of Systems  Constitutional: Negative for fever, weight loss, malaise/fatigue and diaphoresis.  HENT: Negative for ear discharge, ear pain, hearing loss, nosebleeds, sore throat and tinnitus.   Eyes: Negative for blurred vision and pain.  Respiratory: Negative for cough, hemoptysis, shortness of breath and wheezing.   Cardiovascular: Positive for chest pain. Negative for palpitations, orthopnea and leg swelling.  Gastrointestinal: Negative for heartburn, nausea, vomiting, abdominal pain, diarrhea, constipation and blood in stool.  Genitourinary: Negative for dysuria, urgency and frequency.  Musculoskeletal: Negative for myalgias and back pain.  Skin: Negative for itching and rash.  Neurological: Negative for dizziness, tingling, tremors, focal weakness, seizures, weakness and headaches.  Psychiatric/Behavioral: Negative for depression. The patient is not nervous/anxious.    DRUG ALLERGIES:   Allergies  Allergen Reactions  . Demerol [Meperidine] Hives  . Prednisone Other (See Comments) and Hypertension    Pt states that this medication puts him in A-fib   . Sulfa Antibiotics Hives  . Albuterol Sulfate [Albuterol] Palpitations and Other (See Comments)    Pt currently uses this medication.    . Morphine Sulfate Nausea And Vomiting, Rash and Other (See Comments)    Pt states that he is only allergic to the tablet form of this medication.     VITALS:  Blood pressure 108/66, pulse 54, temperature 97.4 F (36.3 C), temperature source Oral, resp. rate 19, height 5' 7"  (1.702 m), weight 108.546 kg (239 lb 4.8 oz), SpO2 94 %. PHYSICAL EXAMINATION:  Physical Exam  Constitutional: He is  oriented to person, place, and time and well-developed, well-nourished, and in no distress.  HENT:  Head: Normocephalic and atraumatic.  Eyes: Conjunctivae and EOM are normal. Pupils are equal, round, and reactive to light.  Neck: Normal range of motion. Neck supple. No tracheal deviation present. No thyromegaly present.  Cardiovascular: Normal rate, regular rhythm and normal heart sounds.   Pulmonary/Chest: Effort normal and breath sounds normal. No respiratory distress. He has no wheezes. He exhibits no tenderness.  Abdominal: Soft. Bowel sounds are normal. He exhibits no distension. There is no tenderness.  Musculoskeletal: Normal range of motion.  Neurological: He is alert and oriented to person, place, and time. No cranial nerve deficit.  Skin: Skin is warm and dry. No rash noted.  Psychiatric: Mood and affect normal.   LABORATORY PANEL:   CBC  Recent Labs Lab 04/05/16 0544  WBC 5.2  HGB 9.8*  HCT 31.0*  PLT 197   ------------------------------------------------------------------------------------------------------------------ Chemistries   Recent Labs Lab 04/05/16 0544  NA 137  K 4.3  CL 101  CO2 32  GLUCOSE 119*  BUN 17  CREATININE 1.07  CALCIUM 8.7*   RADIOLOGY:  No results found. ASSESSMENT AND PLAN:  #1. Unstable angina: continue him on Betapace, unable to use the nitroglycerin due to hypotension, continue aspirin, Plavix, cardiology planning for cardiac catheterization tomorrow  Discussed with Dr. Clayborn Bigness #2. Hypotension, hold diuretics, all blood pressure medications, continue low rate IV fluids #3. Acute on chronic renal insufficiency, continue IV fluids, follow creatinine tomorrow morning #4. Dyspnea due to underlying coronary artery disease, obesity, COPD, follow with therapy, follow for fluid overload #5. Anemia: Stable Hb on Eliquis  All the records are reviewed and case discussed with Care Management/Social Worker. Management plans discussed  with the patient, family and they are in agreement.  CODE STATUS: Full code  TOTAL TIME TAKING CARE OF THIS PATIENT: 35 minutes.   More than 50% of the time was spent in counseling/coordination of care: YES  POSSIBLE D/C IN 1-2 DAYS, DEPENDING ON CLINICAL CONDITION and Cardiac evaluation   [facility], Pierra Skora M.D on 04/05/2016 at 2:54 PM  Between 7am to 6pm - Pager - [phone]  After 6pm go to [email or web address] - password [email or web address] [place] [facility]  Office  [phone]  CC: Primary care physician; [name], MD  Note: This dictation was prepared with Dragon dictation along with smaller phrase technology. Any transcriptional errors that result from this process are unintentional.

## 2016-04-05 NOTE — Progress Notes (Signed)
Subjective:  Patient have a chronic persistent recurrent chest discomfort possible angina requiring narcotics and nitroglycerin states that he is not much better but is not any worse.  Objective:  Vital Signs in the last 24 hours: Temp:  [97.5 F (36.4 C)-97.8 F (36.6 C)] 97.8 F (36.6 C) (05/21 0512) Pulse Rate:  [53-64] 64 (05/21 0802) Resp:  [16-20] 18 (05/21 0512) BP: (105-125)/(62-75) 125/75 mmHg (05/21 0802) SpO2:  [92 %-100 %] 100 % (05/21 0512) Weight:  [108.546 kg (239 lb 4.8 oz)] 108.546 kg (239 lb 4.8 oz) (05/20 1400)  Intake/Output from previous day: 05/20 0701 - 05/21 0700 In: -  Out: 2000 [Urine:2000] Intake/Output from this shift:    Physical Exam: General appearance: appears older than stated age Neck: no adenopathy, no carotid bruit, no JVD, supple, symmetrical, trachea midline and thyroid not enlarged, symmetric, no tenderness/mass/nodules Lungs: clear to auscultation bilaterally Heart: regular rate and rhythm, S1, S2 normal, no murmur, click, rub or gallop Abdomen: soft, non-tender; bowel sounds normal; no masses,  no organomegaly Extremities: extremities normal, atraumatic, no cyanosis or edema Pulses: 2+ and symmetric Skin: Skin color, texture, turgor normal. No rashes or lesions Neurologic: Alert and oriented X 3, normal strength and tone. Normal symmetric reflexes. Normal coordination and gait  Lab Results:  Recent Labs  04/04/16 0839 04/05/16 0544  WBC 5.0 5.2  HGB 10.1* 9.8*  PLT 192 197    Recent Labs  04/04/16 0839 04/05/16 0544  NA 138 137  K 3.7 4.3  CL 102 101  CO2 32 32  GLUCOSE 122* 119*  BUN 20 17  CREATININE 1.29* 1.07    Recent Labs  04/04/16 0246 04/04/16 0839  TROPONINI <0.03 <0.03   Hepatic Function Panel No results for input(s): PROT, ALBUMIN, AST, ALT, ALKPHOS, BILITOT, BILIDIR, IBILI in the last 72 hours. No results for input(s): CHOL in the last 72 hours. No results for input(s): PROTIME in the last 72  hours.  Imaging: Imaging results have been reviewed  Cardiac Studies:  Assessment/Plan:  Angina Atrial Fibrillation CABG Chest Pain Coronary Artery Disease Coronary Artery Stent Ischemic Heart Disease Shortness of Breath  COPD Diabetes Chronic renal insufficiency Obstructive sleep apnea . PLAN Continue telemetry monitoring Recommend invasive cardiac catheter for evaluation of coronary anatomy tomorrow Continue nitrates as necessary continue Ranexa Maintain blood pressure control Insulin therapy should be maintained with NovoLog Glucophage Agree with Lipitor for lipid management Patient's been maintained on aspirin and Plavix chronically will continue Agree with sotalol for rhythm control for atrial fibrillation with holding Eliquis  for cardiac cath Continue inhalers as necessary for COPD Mild hydration in this anticipation of cardiac catheterization      Kristie Bracewell D. 04/05/2016, 10:22 AM

## 2016-04-06 ENCOUNTER — Encounter
Admission: EM | Disposition: A | Discharge status: Home / Self Care | Payer: Self-pay | Source: Home / Self Care | Attending: Internal Medicine

## 2016-04-06 HISTORY — PX: CARDIAC CATHETERIZATION: SHX172

## 2016-04-06 LAB — GLUCOSE, CAPILLARY
Glucose-Capillary: 105 mg/dL — ABNORMAL HIGH (ref 65–99)
Glucose-Capillary: 114 mg/dL — ABNORMAL HIGH (ref 65–99)
Glucose-Capillary: 92 mg/dL (ref 65–99)
Glucose-Capillary: 114 mg/dL — ABNORMAL HIGH (ref 65–99)

## 2016-04-06 SURGERY — LEFT HEART CATH AND CORONARY ANGIOGRAPHY
Anesthesia: Moderate Sedation

## 2016-04-06 MED ORDER — SODIUM CHLORIDE 0.9 % WEIGHT BASED INFUSION: 3.0000 mL/kg/h | INTRAVENOUS | Status: AC

## 2016-04-06 MED ORDER — HEPARIN (PORCINE) IN NACL 2-0.9 UNIT/ML-% IJ SOLN
INTRAMUSCULAR | Status: AC
  Filled 2016-04-06: qty 500

## 2016-04-06 MED ORDER — SODIUM CHLORIDE 0.9% FLUSH
3.0000 mL | INTRAVENOUS | Status: DC | PRN
  Administered 2016-04-06: 3 mL via INTRAVENOUS
  Filled 2016-04-06: qty 3

## 2016-04-06 MED ORDER — ACETAMINOPHEN 325 MG PO TABS: 650.0000 mg | ORAL_TABLET | ORAL | Status: DC | PRN

## 2016-04-06 MED ORDER — MIDAZOLAM HCL 2 MG/2ML IJ SOLN
INTRAMUSCULAR | Status: DC | PRN
Start: 2016-04-06 — End: 2016-04-06
  Administered 2016-04-06 (×2): 1 mg via INTRAVENOUS

## 2016-04-06 MED ORDER — FENTANYL CITRATE (PF) 100 MCG/2ML IJ SOLN
INTRAMUSCULAR | Status: AC
  Filled 2016-04-06: qty 2

## 2016-04-06 MED ORDER — SODIUM CHLORIDE 0.9 % IV SOLN: 250.0000 mL | INTRAVENOUS | Status: DC | PRN

## 2016-04-06 MED ORDER — ONDANSETRON HCL 4 MG/2ML IJ SOLN: 4.0000 mg | Freq: Four times a day (QID) | INTRAMUSCULAR | Status: DC | PRN

## 2016-04-06 MED ORDER — MIDAZOLAM HCL 2 MG/2ML IJ SOLN
INTRAMUSCULAR | Status: AC
  Filled 2016-04-06: qty 2

## 2016-04-06 MED ORDER — SODIUM CHLORIDE 0.9% FLUSH
3.0000 mL | Freq: Two times a day (BID) | INTRAVENOUS | Status: DC
  Administered 2016-04-07: 3 mL via INTRAVENOUS

## 2016-04-06 MED ORDER — FENTANYL CITRATE (PF) 100 MCG/2ML IJ SOLN
INTRAMUSCULAR | Status: DC | PRN
  Administered 2016-04-06: 25 ug via INTRAVENOUS

## 2016-04-06 MED ORDER — IOPAMIDOL (ISOVUE-300) INJECTION 61%
INTRAVENOUS | Status: DC | PRN
  Administered 2016-04-06: 170 mL via INTRA_ARTERIAL

## 2016-04-06 SURGICAL SUPPLY — 9 items
CATH INFINITI 5FR ANG PIGTAIL (CATHETERS) ×3 IMPLANT
CATH INFINITI 5FR JL4 (CATHETERS) ×3 IMPLANT
CATH INFINITI JR4 5F (CATHETERS) ×3 IMPLANT
DEVICE CLOSURE MYNXGRIP 5F (Vascular Products) ×3 IMPLANT
KIT MANI 3VAL PERCEP (MISCELLANEOUS) ×3 IMPLANT
NEEDLE PERC 18GX7CM (NEEDLE) ×3 IMPLANT
PACK CARDIAC CATH (CUSTOM PROCEDURE TRAY) ×3 IMPLANT
SHEATH AVANTI 5FR X 11CM (SHEATH) ×3 IMPLANT
WIRE EMERALD 3MM-J .035X150CM (WIRE) ×3 IMPLANT

## 2016-04-06 NOTE — Progress Notes (Addendum)
Patient moved from 247 to 254 per "bedbug protocol".  CCMD notified.  Patient is not on contact precautions.  Standard precautions being maintained.    Cardiac cath moved from this morning to this afternoon.  VSS.  Some pain/chest discomfort relieved by morphine.   Patient does not want to take his morning medications on an empty stomach.  He wants to wait until after cardiac catheterization.

## 2016-04-06 NOTE — Progress Notes (Signed)
Pt returns from cardiac cath, right groin dressing is clean, dry and intact. No hematoma noted. Pt complain of chest pain treated with prn medication. I will continue to assess.

## 2016-04-06 NOTE — Progress Notes (Signed)
[facility] [place] NAME: Lawrence [name]    MR#:  [id number]  DATE OF BIRTH:  July 31, 1956  SUBJECTIVE:  CHIEF COMPLAINT:   Chief Complaint  Patient presents with  . Chest Pain  Continued having chest pain, cath today shoed no stenosis needing PCI.  Continues to have pain after cath  REVIEW OF SYSTEMS:  Review of Systems  Constitutional: Negative for fever, weight loss, malaise/fatigue and diaphoresis.  HENT: Negative for ear discharge, ear pain, hearing loss, nosebleeds, sore throat and tinnitus.   Eyes: Negative for blurred vision and pain.  Respiratory: Negative for cough, hemoptysis, shortness of breath and wheezing.   Cardiovascular: Positive for chest pain. Negative for palpitations, orthopnea and leg swelling.  Gastrointestinal: Negative for heartburn, nausea, vomiting, abdominal pain, diarrhea, constipation and blood in stool.  Genitourinary: Negative for dysuria, urgency and frequency.  Musculoskeletal: Negative for myalgias and back pain.  Skin: Negative for itching and rash.  Neurological: Negative for dizziness, tingling, tremors, focal weakness, seizures, weakness and headaches.  Psychiatric/Behavioral: Negative for depression. The patient is not nervous/anxious.    DRUG ALLERGIES:   Allergies  Allergen Reactions  . Demerol [Meperidine] Hives  . Prednisone Other (See Comments) and Hypertension    Pt states that this medication puts him in A-fib   . Sulfa Antibiotics Hives  . Albuterol Sulfate [Albuterol] Palpitations and Other (See Comments)    Pt currently uses this medication.    . Morphine Sulfate Nausea And Vomiting, Rash and Other (See Comments)    Pt states that he is only allergic to the tablet form of this medication.     VITALS:  Blood pressure 135/85, pulse 70, temperature 97.8 F (36.6 C), temperature source Oral, resp. rate 18, height 5' 7"  (1.702 m), weight 108.41 kg (239 lb), SpO2 96 %. PHYSICAL  EXAMINATION:  Physical Exam  Constitutional: He is oriented to person, place, and time and well-developed, well-nourished, and in no distress.  HENT:  Head: Normocephalic and atraumatic.  Eyes: Conjunctivae and EOM are normal. Pupils are equal, round, and reactive to light.  Neck: Normal range of motion. Neck supple. No tracheal deviation present. No thyromegaly present.  Cardiovascular: Normal rate, regular rhythm and normal heart sounds.   Pulmonary/Chest: Effort normal and breath sounds normal. No respiratory distress. He has no wheezes. He exhibits no tenderness.  Abdominal: Soft. Bowel sounds are normal. He exhibits no distension. There is no tenderness.  Musculoskeletal: Normal range of motion.  Neurological: He is alert and oriented to person, place, and time. No cranial nerve deficit.  Skin: Skin is warm and dry. No rash noted.  Psychiatric: Mood and affect normal.   LABORATORY PANEL:   CBC  Recent Labs Lab 04/05/16 0544  WBC 5.2  HGB 9.8*  HCT 31.0*  PLT 197   ------------------------------------------------------------------------------------------------------------------ Chemistries   Recent Labs Lab 04/05/16 0544  NA 137  K 4.3  CL 101  CO2 32  GLUCOSE 119*  BUN 17  CREATININE 1.07  CALCIUM 8.7*   RADIOLOGY:  No results found. ASSESSMENT AND PLAN:  #1. Unstable angina: continue him on Betapace Plavix. Imdur Monitor overnight. Likely d/c in AM after cardiology sees patient  #2. Hypotension, hold diuretics, all blood pressure medications, continue low rate IV fluids  #3. CKD 3- Stable  #4. Dyspnea due to underlying coronary artery disease, obesity, COPD Stable  #5. Anemia: Stable Hb on Eliquis  All the records are reviewed and case discussed with Care Management/Social  Worker. Management plans discussed with the patient, family and they are in agreement.  CODE STATUS: Full code  TOTAL TIME TAKING CARE OF THIS PATIENT: 35 minutes.   POSSIBLE  D/C IN 1-2 DAYS, DEPENDING ON CLINICAL CONDITION and Cardiac input   Hillary Bow R M.D on 04/06/2016 at 6:18 PM  Between 7am to 6pm - Pager - [phone]  After 6pm go to [email or web address] - password [email or web address] [facility]  Office  [phone]  CC: Primary care physician; [name], MD  Note: This dictation was prepared with Dragon dictation along with smaller phrase technology. Any transcriptional errors that result from this process are unintentional.

## 2016-04-06 NOTE — Discharge Instructions (Signed)
°  DIET:  °Cardiac diet ° °DISCHARGE CONDITION:  °Stable ° °ACTIVITY:  °Activity as tolerated ° °OXYGEN:  °Home Oxygen: Yes.   °  °Oxygen Delivery: 2 liters/min via Patient connected to nasal cannula oxygen ° °DISCHARGE LOCATION:  °home  ° °If you experience worsening of your admission symptoms, develop shortness of breath, life threatening emergency, suicidal or homicidal thoughts you must seek medical attention immediately by calling 911 or calling your MD immediately  if symptoms less severe. ° °You Must read complete instructions/literature along with all the possible adverse reactions/side effects for all the Medicines you take and that have been prescribed to you. Take any new Medicines after you have completely understood and accpet all the possible adverse reactions/side effects.  ° °Please note ° °You were cared for by a hospitalist during your hospital stay. If you have any questions about your discharge medications or the care you received while you were in the hospital after you are discharged, you can call the unit and asked to speak with the hospitalist on call if the hospitalist that took care of you is not available. Once you are discharged, your primary care physician will handle any further medical issues. Please note that NO REFILLS for any discharge medications will be authorized once you are discharged, as it is imperative that you return to your primary care physician (or establish a relationship with a primary care physician if you do not have one) for your aftercare needs so that they can reassess your need for medications and monitor your lab values. ° ° ° °

## 2016-04-07 ENCOUNTER — Other Ambulatory Visit: Payer: Self-pay | Admitting: *Deleted

## 2016-04-07 ENCOUNTER — Encounter: Payer: Self-pay | Admitting: *Deleted

## 2016-04-07 ENCOUNTER — Encounter: Payer: Self-pay | Admitting: Internal Medicine

## 2016-04-07 LAB — GLUCOSE, CAPILLARY
Glucose-Capillary: 124 mg/dL — ABNORMAL HIGH (ref 65–99)
Glucose-Capillary: 95 mg/dL (ref 65–99)

## 2016-04-07 MED ORDER — BISACODYL 5 MG PO TBEC
10.0000 mg | DELAYED_RELEASE_TABLET | Freq: Every day | ORAL | Status: DC
  Administered 2016-04-07: 10 mg via ORAL
  Filled 2016-04-07: qty 2

## 2016-04-07 NOTE — Progress Notes (Signed)
Pt discharged to home, IV sites DCd, bleeding controlled, tele monitor turned in, no new scrips, f/u appts given, pt's son did not bring and portable O2, per patient he regularly takes his oxygen off to take a shower with no difficulties, will be going straight home, lives 5-6 minutes away, declined to stay until son went back home and returned to hospital.

## 2016-04-07 NOTE — Discharge Summary (Signed)
[name] at [name] NAME: Lawrence [name]    MR#:  [id number]  DATE OF BIRTH:  23-Mar-1956  DATE OF ADMISSION:  04/03/2016 ADMITTING PHYSICIAN: Theodoro Grist, MD  DATE OF DISCHARGE: 04/07/2016  2:30 PM  PRIMARY CARE PHYSICIAN: [name], MD   ADMISSION DIAGNOSIS:  Dyspnea [R06.00] Chest pain, unspecified chest pain type [R07.9]  DISCHARGE DIAGNOSIS:  Principal Problem:   Unstable angina ([place]) Active Problems:   Dyspnea   Hypotension   Acute on chronic renal insufficiency (HCC)   Anemia   SECONDARY DIAGNOSIS:   Past Medical History  Diagnosis Date  . CAD (coronary artery disease)     a. 2002 CABGx2 (LIMA->LAD, VG->VG->OM1);  b. 09/2012 DES->OM;  c. 03/2015 PTCA of LAD [facility]) in setting of atretic LIMA; d. 05/2015 Cath [facility]): nonobs dzs; e. 06/2015 Cath ([facility]): LM nl, LAD 45p/d ISR, 50d, D1/2 small, LCX 50p/d ISR, OM1 70ost, 30 ISR, VG->OM1 50ost, 71m LIMA->LAD 99p/d - atretic, RCA dom, nl; f.cath 10/16: 40-50%(FFR 0.90) pLAD, 75% (FFR 0.77) mLAD s/p PCI/DES, oRCA 40% (FFR0.95)  . Essential hypertension   . COPD (chronic obstructive pulmonary disease) ([place]     a. Chronic bronchitis and emphysema.  . Type II diabetes mellitus ([place]   . Asthma   . Celiac disease   . Chronic diastolic CHF (congestive heart failure) ([place]     a. 06/2009 Echo: EF 60-65%, Gr 1 DD, triv AI, mildly dil LA, nl RV.  .Marland KitchenAnxiety   . History of tobacco abuse     a. Quit 2014.  .Marland KitchenPSVT (paroxysmal supraventricular tachycardia) ([place]     a. 10/2012 Noted on Zio Patch.  . Dysrhythmia   . A-fib ([place]      ADMITTING HISTORY  HISTORY OF PRESENT ILLNESS: Lawrence Adachiis a 60y.o. male with a known history of Coronary artery disease, coronary artery bypass grafting 2, essential hypertension, COPD, chronic respiratory failure on 2 L of oxygen nasal cannula at home, who presents to have full with complaints of chest pain. Pain is described as tightness in  the left side of the chest coming on whenever he starts walking or going up stairs. Pain is as as bad as 8-10 out of 10 by intensity and it would last approximately 15-30 minutes with no radiation, intermittent with no artery bleeding or other aggravating factors. Does admit that he is been very short of breath, especially on exertion and with these chest pains, worsening over the period of 3 days. On arrival to emergency room, patient was noted to be hypotensive and hospitalist services were contacted for admission.   HOSPITAL COURSE:   #1. Unstable angina: continue him on Betapace Plavix. Imdur Patient had cardiac catheterization which showed significant CAD but unchanged from prior catheterization. No lesions needing PCI. His symptoms are thought to be likely small vessels.  #2. Hypertension Continue medications on discharge   #3. CKD 3- Stable  #4. Dyspnea due to underlying coronary artery disease, obesity, COPD Stable  #5. Anemia: Stable Hb on Eliquis  Patient continues to have mild pain but is on maximal medical therapy. Follow-up with PCP and cardiology as outpatient.  CONSULTS OBTAINED:  Treatment Team:  DYolonda Kida MD BCorey Skains MD  DRUG ALLERGIES:   Allergies  Allergen Reactions  . Demerol [Meperidine] Hives  . Prednisone Other (See Comments) and Hypertension    Pt states that this medication puts him in A-fib   . Sulfa Antibiotics Hives  .  Albuterol Sulfate [Albuterol] Palpitations and Other (See Comments)    Pt currently uses this medication.    . Morphine Sulfate Nausea And Vomiting, Rash and Other (See Comments)    Pt states that he is only allergic to the tablet form of this medication.      DISCHARGE MEDICATIONS:   Discharge Medication List as of 04/07/2016 12:22 PM    CONTINUE these medications which have NOT CHANGED   Details  albuterol (PROVENTIL HFA;VENTOLIN HFA) 108 (90 Base) MCG/ACT inhaler Inhale 4-6 puffs into the lungs every 4  (four) hours as needed for wheezing or shortness of breath., Until Discontinued, Historical Med    allopurinol (ZYLOPRIM) 100 MG tablet Take 300 mg by mouth 2 (two) times daily., Until Discontinued, Historical Med    ALPRAZolam (XANAX) 1 MG tablet Take 0.5-1 tablets (0.5-1 mg total) by mouth 3 (three) times daily as needed for anxiety., Starting 10/28/2015, Until Discontinued, Print    amLODipine (NORVASC) 10 MG tablet Take 10 mg by mouth daily., Until Discontinued, Historical Med    apixaban (ELIQUIS) 5 MG TABS tablet Take 5 mg by mouth 2 (two) times daily., Until Discontinued, Historical Med    atorvastatin (LIPITOR) 80 MG tablet Take 1 tablet (80 mg total) by mouth at bedtime., Starting 04/25/2015, Until Discontinued, Normal    cetirizine (ZYRTEC) 10 MG tablet Take 10 mg by mouth at bedtime., Until Discontinued, Historical Med    clopidogrel (PLAVIX) 75 MG tablet Take 1 tablet (75 mg total) by mouth daily., Starting 10/08/2015, Until Discontinued, Normal    cyclobenzaprine (FLEXERIL) 10 MG tablet Take 1 tablet (10 mg total) by mouth every 8 (eight) hours as needed for muscle spasms., Starting 01/08/2016, Until Discontinued, Normal    fluticasone furoate-vilanterol (BREO ELLIPTA) 100-25 MCG/INH AEPB Inhale 1 puff into the lungs daily. , Until Discontinued, Historical Med    furosemide (LASIX) 80 MG tablet Take 80 mg by mouth 2 (two) times daily., Until Discontinued, Historical Med    isosorbide mononitrate (IMDUR) 60 MG 24 hr tablet Take 120 mg by mouth daily. , Until Discontinued, Historical Med    lansoprazole (PREVACID) 30 MG capsule Take 30 mg by mouth daily. , Until Discontinued, Historical Med    lisinopril (PRINIVIL,ZESTRIL) 10 MG tablet Take 10 mg by mouth daily., Until Discontinued, Historical Med    magnesium oxide (MAG-OX) 400 (241.3 Mg) MG tablet Take 400 mg by mouth daily., Until Discontinued, Historical Med    metFORMIN (GLUCOPHAGE) 500 MG tablet Take 500 mg by mouth daily  with breakfast. , Until Discontinued, Historical Med    metoprolol tartrate (LOPRESSOR) 25 MG tablet Take 50 mg by mouth 2 (two) times daily. , Until Discontinued, Historical Med    nitroGLYCERIN (NITROSTAT) 0.4 MG SL tablet Place 0.4 mg under the tongue every 5 (five) minutes as needed for chest pain., Until Discontinued, Historical Med    omega-3 acid ethyl esters (LOVAZA) 1 g capsule Take 4 g by mouth daily., Until Discontinued, Historical Med    oxyCODONE-acetaminophen (PERCOCET) 10-325 MG tablet Take 1 tablet by mouth every 4 (four) hours as needed for pain., Starting 03/21/2016, Until Discontinued, Print    potassium chloride (K-DUR,KLOR-CON) 10 MEQ tablet Take 10 mEq by mouth daily., Until Discontinued, Historical Med    ranolazine (RANEXA) 500 MG 12 hr tablet Take 2 tablets (1,000 mg total) by mouth 2 (two) times daily., Starting 03/03/2016, Until Discontinued, Normal    sotalol (BETAPACE) 120 MG tablet Take 120 mg by mouth 2 (two) times daily. ,  Until Discontinued, Historical Med    tamsulosin (FLOMAX) 0.4 MG CAPS capsule Take 0.4 mg by mouth daily after breakfast. , Until Discontinued, Historical Med    tiotropium (SPIRIVA) 18 MCG inhalation capsule Place 18 mcg into inhaler and inhale daily., Until Discontinued, Historical Med        Today   VITAL SIGNS:  Blood pressure 111/55, pulse 65, temperature 98.2 F (36.8 C), temperature source Oral, resp. rate 18, height 5' 7"  (1.702 m), weight 108.41 kg (239 lb), SpO2 97 %.  I/O:   Intake/Output Summary (Last 24 hours) at 04/07/16 1501 Last data filed at 04/07/16 1407  Gross per 24 hour  Intake    600 ml  Output   1675 ml  Net  -1075 ml    PHYSICAL EXAMINATION:  Physical Exam  GENERAL:  60 y.o.-year-old patient lying in the bed with no acute distress.  LUNGS: Normal breath sounds bilaterally, no wheezing, rales,rhonchi or crepitation. No use of accessory muscles of respiration.  CARDIOVASCULAR: S1, S2 normal. No murmurs,  rubs, or gallops.  ABDOMEN: Soft, non-tender, non-distended. Bowel sounds present. No organomegaly or mass.  NEUROLOGIC: Moves all 4 extremities. PSYCHIATRIC: The patient is alert and oriented x 3.  SKIN: No obvious rash, lesion, or ulcer.   DATA REVIEW:   CBC  Recent Labs Lab 04/05/16 0544  WBC 5.2  HGB 9.8*  HCT 31.0*  PLT 197    Chemistries   Recent Labs Lab 04/05/16 0544  NA 137  K 4.3  CL 101  CO2 32  GLUCOSE 119*  BUN 17  CREATININE 1.07  CALCIUM 8.7*    Cardiac Enzymes  Recent Labs Lab 04/04/16 0839  TROPONINI <0.03    Microbiology Results  Results for orders placed or performed during the hospital encounter of 07/11/15  MRSA PCR Screening     Status: None   Collection Time: 07/11/15  9:09 PM  Result Value Ref Range Status   MRSA by PCR NEGATIVE NEGATIVE Final    Comment:        The GeneXpert MRSA Assay (FDA approved for NASAL specimens only), is one component of a comprehensive MRSA colonization surveillance program. It is not intended to diagnose MRSA infection nor to guide or monitor treatment for MRSA infections.     RADIOLOGY:  No results found.  Follow up with PCP in 1 week.  Management plans discussed with the patient, family and they are in agreement.  CODE STATUS:     Code Status Orders        Start     Ordered   04/03/16 2056  Full code   Continuous     04/03/16 2055    Code Status History    Date Active Date Inactive Code Status Order ID Comments User Context   03/26/2016 10:24 PM 03/28/2016  6:00 PM Full Code [id number]  Lance Coon, MD Inpatient   10/07/2015  9:36 AM 10/07/2015  6:30 PM Full Code [id number]  Dionisio David, MD Inpatient   10/07/2015  8:26 AM 10/07/2015  9:36 AM Full Code [id number]  Dionisio David, MD Inpatient   10/05/2015  7:29 PM 10/07/2015  8:26 AM Full Code [id number]  Idelle Crouch, MD Inpatient   09/12/2015  9:29 PM 09/14/2015  2:25 PM Full Code [id number]  Demetrios Loll, MD Inpatient    08/01/2015  5:21 AM 08/02/2015  1:46 PM Full Code [id number]  Lance Coon, MD Inpatient   07/12/2015  3:24 PM 07/13/2015  8:17 PM Full Code  [id number]  Belva Crome, MD Inpatient   07/11/2015  8:00 PM 07/12/2015  3:24 PM Full Code [id number]  Lonn Georgia, PA-C Inpatient   06/26/2015  5:43 AM 06/27/2015  3:30 PM Full Code [id number]  Juluis Mire, MD Inpatient      TOTAL TIME TAKING CARE OF THIS PATIENT ON DAY OF DISCHARGE: more than 30 minutes.   Hillary Bow R M.D on 04/07/2016 at 3:01 PM  Between 7am to 6pm - Pager - [phone]  After 6pm go to [email or web address] - password [email or web address] [facility]  Office  [phone]  CC: Primary care physician; [name], MD  Note: This dictation was prepared with Dragon dictation along with smaller phrase technology. Any transcriptional errors that result from this process are unintentional.

## 2016-04-07 NOTE — Plan of Care (Signed)
Problem: Activity: Goal: Risk for activity intolerance will decrease Outcome: Not Progressing Patient   Problem: Bowel/Gastric: Goal: Will not experience complications related to bowel motility Outcome: Not Progressing Patient has not had a bowel movement since 5/19, patient has received a laxative and a stool softner without any results.

## 2016-04-07 NOTE — Patient Outreach (Signed)
Late entry.    Noted pt  called [facility] CM nurse call center 5/19 at 12:27pm. View of triage call- pt c/o dizziness, weak, chest pain, 911 outcome documented.  Note in Epic- pt arrived at [facility] ED at 13:41 pm.    Pt remains in patient, this RN CM  will f/u at discharge.    Lawrence [name].   [place] [facility]  [phone]

## 2016-04-07 NOTE — Consult Note (Signed)
   [facility] Inpatient Consult   04/07/2016  Lawrence [name] [birth year] [id number]   Patient is currently active with [facility] for chronic disease management services as a benefit of his [facility] Medicare.  Patient has been engaged by a Market researcher.   Our community based plan of care has focused on disease management and community resource support.   Patient will receive a post discharge transition of care call and will be evaluated for monthly home visits for assessments and disease process education. Written consent obtained. Patient also gave written permission to speak with his wife Lawrence [name] at [phone] or his son Lawrence [name] at [phone]. Made Inpatient Case Manager aware that [facility] following. Of note, [facility] Care Management services does not replace or interfere with any services that are needed or arranged by inpatient case management or social work.  For additional questions or referrals please contact:  Mercedees Convery RN, [facility] Liaison  [phone]) [place] [phone]) Toll free office

## 2016-04-07 NOTE — Care Management Important Message (Signed)
Important Message  Patient Details  Name: Lawrence [name] MRN: [id number] Date of Birth: [birth year]   Medicare Important Message Given:  Yes    [name] 04/07/2016, 2:58 PM

## 2016-04-08 ENCOUNTER — Other Ambulatory Visit: Payer: Self-pay | Admitting: *Deleted

## 2016-04-08 ENCOUNTER — Ambulatory Visit: Payer: Self-pay | Admitting: *Deleted

## 2016-04-08 NOTE — Patient Outreach (Signed)
Arrived at pt's home for scheduled home visit (part of transition of care program from 5/13 discharge plus pt readmitted 5/19, discharged 5/23).   Pt reports wants to cancel today's visit, reschedule.    Pt reports he is doing fine since discharge from hospital 5/23.  RN CM discussed with pt plan to do a transition of care call to which pt requested call later today.     Plan to f/u with pt later today telephonically.     Zara Chess.   [facility] [facility]  [phone]

## 2016-04-08 NOTE — Patient Outreach (Signed)
Transition of care call (second admission in one month, discharged 5/23).  Pt reports tired, weak since discharge which is to be expected.   Pt reports had catheterization done  while in patient, told nothing can be done, to treat with medication/keep pain down.  Pt  reports to f/u with Dr. [name] and Heart MD 5/31.   RN CM discussed with pt will  continue to follow for transition of care, pt agreed to home visit 6/1.    Patient was recently discharged from hospital and all medications have been reviewed- 5/24.   Zara Chess.   [place] [facility]  [phone]

## 2016-04-09 ENCOUNTER — Other Ambulatory Visit: Payer: Self-pay | Admitting: Family Medicine

## 2016-04-09 DIAGNOSIS — G4733 Obstructive sleep apnea (adult) (pediatric): Secondary | ICD-10-CM | POA: Diagnosis not present

## 2016-04-11 DIAGNOSIS — J439 Emphysema, unspecified: Secondary | ICD-10-CM | POA: Diagnosis not present

## 2016-04-11 DIAGNOSIS — I10 Essential (primary) hypertension: Secondary | ICD-10-CM | POA: Diagnosis not present

## 2016-04-11 DIAGNOSIS — J449 Chronic obstructive pulmonary disease, unspecified: Secondary | ICD-10-CM | POA: Diagnosis not present

## 2016-04-11 DIAGNOSIS — G4710 Hypersomnia, unspecified: Secondary | ICD-10-CM | POA: Diagnosis not present

## 2016-04-15 ENCOUNTER — Ambulatory Visit (INDEPENDENT_AMBULATORY_CARE_PROVIDER_SITE_OTHER): Payer: Commercial Managed Care - HMO | Admitting: Family Medicine

## 2016-04-15 ENCOUNTER — Encounter: Payer: Self-pay | Admitting: Family Medicine

## 2016-04-15 VITALS — BP 100/58 | HR 54 | Temp 97.8°F | Resp 18 | Wt 247.0 lb

## 2016-04-15 DIAGNOSIS — I10 Essential (primary) hypertension: Secondary | ICD-10-CM | POA: Diagnosis not present

## 2016-04-15 DIAGNOSIS — R072 Precordial pain: Secondary | ICD-10-CM

## 2016-04-15 DIAGNOSIS — J449 Chronic obstructive pulmonary disease, unspecified: Secondary | ICD-10-CM | POA: Diagnosis not present

## 2016-04-15 DIAGNOSIS — N183 Chronic kidney disease, stage 3 unspecified: Secondary | ICD-10-CM

## 2016-04-15 DIAGNOSIS — R1013 Epigastric pain: Secondary | ICD-10-CM

## 2016-04-15 DIAGNOSIS — D649 Anemia, unspecified: Secondary | ICD-10-CM | POA: Diagnosis not present

## 2016-04-15 NOTE — Progress Notes (Signed)
Patient: Lawrence [name] Male    DOB: 1956-11-14   60 y.o.   MRN: [id number] Visit Date: 04/15/2016  Today's Provider: [name], MD   Chief Complaint  Patient presents with  . Hospitalization Follow-up  . Chest Pain   Subjective:    HPI  Follow up Hospitalization/ Unstable Angina  Patient was admitted to [facility] on 04/03/2016 and discharged on 04/07/2016. He was treated for Unstable Angina. While patient was hospitalized, a cardiac Catheterization was done showing significant CAD but unchanged from prior catheterization. No lesions needing PCI. Patients symptoms were likely small vessels. Per discharge summary, patient was to follow up with his PCP and Cardiologist as an outpatient. Patient has an appointment to see Dr. Clayborn Bigness on June 2.  He reports good compliance with treatment. He reports this condition is Unchanged. Patient states he has been having intermittent chest pain since being discharged from the hospital. Patient was discharged home on 2L continuous 02.Patient states he had to increase his oxygen to 3L due to him becoming dizzy and lightheaded when standing on the 2L setting. He has follow up scheduled with his pulmonologist next week.   ------------------------------------------------------------------------------------ He was also noted to be moderately anemic upon admission. He states he has been more fatigued and short of breath as above. He denies seeing any blood per rectum, but has had epigastric lately, which is worse when eating. He states he was supposed to have had upper endoscopy last year but was cancelled.   Review of GI notes indicated it was postponed due to being on plavix at the time.     Allergies  Allergen Reactions  . Demerol [Meperidine] Hives  . Prednisone Other (See Comments) and Hypertension    Pt states that this medication puts him in A-fib   . Sulfa Antibiotics Hives  . Albuterol Sulfate [Albuterol] Palpitations and Other (See  Comments)    Pt currently uses this medication.    . Morphine Sulfate Nausea And Vomiting, Rash and Other (See Comments)    Pt states that he is only allergic to the tablet form of this medication.     Previous Medications   ALBUTEROL (PROVENTIL HFA;VENTOLIN HFA) 108 (90 BASE) MCG/ACT INHALER    Inhale 4-6 puffs into the lungs every 4 (four) hours as needed for wheezing or shortness of breath. Reported on 04/08/2016   ALLOPURINOL (ZYLOPRIM) 100 MG TABLET    Take 300 mg by mouth 2 (two) times daily.   ALPRAZOLAM (XANAX) 1 MG TABLET    Take 0.5-1 tablets (0.5-1 mg total) by mouth 3 (three) times daily as needed for anxiety.   AMLODIPINE (NORVASC) 10 MG TABLET    Take 10 mg by mouth daily.   APIXABAN (ELIQUIS) 5 MG TABS TABLET    Take 5 mg by mouth 2 (two) times daily.   ATORVASTATIN (LIPITOR) 80 MG TABLET    Take 1 tablet (80 mg total) by mouth at bedtime.   CETIRIZINE (ZYRTEC) 10 MG TABLET    Take 10 mg by mouth at bedtime.   CLOPIDOGREL (PLAVIX) 75 MG TABLET    Take 1 tablet (75 mg total) by mouth daily.   CYCLOBENZAPRINE (FLEXERIL) 10 MG TABLET    Take 1 tablet (10 mg total) by mouth every 8 (eight) hours as needed for muscle spasms.   FLUTICASONE FUROATE-VILANTEROL (BREO ELLIPTA) 100-25 MCG/INH AEPB    Inhale 1 puff into the lungs daily.    FUROSEMIDE (LASIX) 80 MG TABLET  Take 80 mg by mouth 2 (two) times daily.   ISOSORBIDE MONONITRATE (IMDUR) 60 MG 24 HR TABLET    Take 120 mg by mouth daily.    LANSOPRAZOLE (PREVACID) 30 MG CAPSULE    Take 30 mg by mouth daily.    LISINOPRIL (PRINIVIL,ZESTRIL) 10 MG TABLET    Take 10 mg by mouth daily.   MAGNESIUM OXIDE (MAG-OX) 400 (241.3 MG) MG TABLET    Take 400 mg by mouth daily.   METFORMIN (GLUCOPHAGE) 500 MG TABLET    Take 500 mg by mouth daily with breakfast.    METOPROLOL TARTRATE (LOPRESSOR) 25 MG TABLET    Take 50 mg by mouth 2 (two) times daily.    NITROGLYCERIN (NITROSTAT) 0.4 MG SL TABLET    Place 0.4 mg under the tongue every 5 (five)  minutes as needed for chest pain.   OMEGA-3 ACID ETHYL ESTERS (LOVAZA) 1 G CAPSULE    Take 4 g by mouth daily.   OXYCODONE-ACETAMINOPHEN (PERCOCET) 10-325 MG TABLET    Take 1 tablet by mouth every 4 (four) hours as needed for pain.   POTASSIUM CHLORIDE (K-DUR,KLOR-CON) 10 MEQ TABLET    Take 10 mEq by mouth daily.   RANEXA 500 MG 12 HR TABLET    TAKE 2 TABLETS BY MOUTH TWICE A DAY.   SOTALOL (BETAPACE) 120 MG TABLET    Take 120 mg by mouth 2 (two) times daily.    TAMSULOSIN (FLOMAX) 0.4 MG CAPS CAPSULE    Take 0.4 mg by mouth daily after breakfast.    TIOTROPIUM (SPIRIVA) 18 MCG INHALATION CAPSULE    Place 18 mcg into inhaler and inhale daily.    Review of Systems  Constitutional: Positive for fatigue. Negative for fever, chills, diaphoresis and appetite change.  Respiratory: Positive for cough and shortness of breath. Negative for chest tightness and wheezing.   Cardiovascular: Positive for chest pain and leg swelling. Negative for palpitations.  Gastrointestinal: Negative for nausea, vomiting and abdominal pain.  Neurological: Positive for weakness. Negative for dizziness, light-headedness and headaches.    Social History  Substance Use Topics  . Smoking status: Former Smoker    Quit date: 04/22/2013  . Smokeless tobacco: Never Used  . Alcohol Use: No     Comment: remotely quit alcohol use. Hx of heavy alcohol use.   Objective:   BP 100/58 mmHg  Pulse 54  Temp(Src) 97.8 F (36.6 C) (Oral)  Resp 18  Wt 247 lb (112.038 kg)  SpO2 98%  Physical Exam   General Appearance:    Alert, cooperative, no distress  Eyes:    PERRL, conjunctiva/corneas clear, EOM's intact       Lungs:     Clear to auscultation bilaterally, respirations unlabored. Diminished lung sounds.   Heart:    Regular rate and rhythm  Neurologic:   Awake, alert, oriented x 3. No apparent focal neurological           defect.          Assessment & Plan:     1. Anemia, unspecified anemia type New upon recent  hospital admission. He has had epigastric pain. Will treat if h. Pylori is positive. Otherwise consider referral back to GI if iron deficienct.  - Ferritin - CBC - Iron and TIBC - B12 - Folate  2. Chronic kidney disease, stage 3 (moderate) Stable   3. Chronic obstructive pulmonary disease, unspecified COPD type ([place]) Continue 3lpm O2 until seen by pulmonary.   4. Essential hypertension Well  controlled.  Continue current medications.  Consider reducing amlodipine if BP remains low.   5. Epigastric pain  - H Pylori, IGM, IGG, IGA AB - Amylase  6. Precordial pain Stable since discharge. Follow up cardiology 6-2 as scheduled.       The entirety of the information documented in the History of Present Illness, Review of Systems and Physical Exam were personally obtained by me. Portions of this information were initially documented by Meyer Cory, CMA and reviewed by me for thoroughness and accuracy.   Addressed extensive list of chronic and acute medical problems today requiring extensive time in counseling and coordination care.  Over half of this 45 minute visit were spent in counseling and coordinating care of multiple medical problems.   [name], MD  [facility]

## 2016-04-16 ENCOUNTER — Other Ambulatory Visit: Payer: Self-pay | Admitting: *Deleted

## 2016-04-16 ENCOUNTER — Telehealth: Payer: Self-pay

## 2016-04-16 ENCOUNTER — Encounter: Payer: Self-pay | Admitting: *Deleted

## 2016-04-16 DIAGNOSIS — D649 Anemia, unspecified: Secondary | ICD-10-CM

## 2016-04-16 NOTE — Telephone Encounter (Signed)
-----   Message from Birdie Sons, MD sent at 04/16/2016  7:51 AM EDT ----- Very low iron levels consistent with blood loss. Probably from Gi tract. Needs referral to GI for further evaluation. Needs to start taking OTC Iron sulfate 313m twice a day,  Also, please make sure he is taking Prevacid every day.

## 2016-04-16 NOTE — Telephone Encounter (Signed)
Advised patient as below. Patient reports that he is taking Prevacid and will start taking Iron sulfate 347m BID. Order has been placed for GI referral. Patient would like to [facility] if possible. Please schedule. Thanks!

## 2016-04-16 NOTE — Patient Outreach (Signed)
[name]   04/16/2016  Lawrence [name] [birth year] [id number]  Lawrence [name] is an 60 y.o. male  Subjective:  Pt reports f/u with Dr. Caryn [name] yesterday, lab work done, results show anemic, MD to refer To GI MD for upper GI/colonoscopy.   Pt reports to see Dr. Nehemiah [name] tomorrow.  Pt reports he was  Discharged home on  2 liters of oxygen, went up to 3 liters.   Pt reports weighs daily, weight in the hospital Was 241 lbs, at recent MD office 249 lbs (clothes, shoes on after eating).  Pt reports no change in sob, abdomen Distended, no c/o chest pain.    Pt reports sugar 110, last A1C 6.4    Objective:  ROS  Filed Vitals:   04/16/16 1330 04/16/16 1415  BP: 90/60 100/60  Pulse: 68   Resp:  20     Physical Exam  Constitutional: He is oriented to person, place, and time. He appears well-developed and well-nourished.  Cardiovascular: Normal rate, regular rhythm and normal heart sounds.   Respiratory: Effort normal and breath sounds normal.  2-3 L [employer]   GI: He exhibits distension.  Musculoskeletal:  Difficulty walking   Neurological: He is alert and oriented to person, place, and time.  Skin: Skin is warm and dry.  Psychiatric: He has a normal mood and affect. His behavior is normal. Judgment and thought content normal.    Encounter Medications:   Outpatient Encounter Prescriptions as of 04/16/2016  Medication Sig Note  . albuterol (PROVENTIL HFA;VENTOLIN HFA) 108 (90 Base) MCG/ACT inhaler Inhale 4-6 puffs into the lungs every 4 (four) hours as needed for wheezing or shortness of breath. Reported on 04/08/2016   . allopurinol (ZYLOPRIM) 100 MG tablet Take 300 mg by mouth 2 (two) times daily.   Marland Kitchen ALPRAZolam (XANAX) 1 MG tablet Take 0.5-1 tablets (0.5-1 mg total) by mouth 3 (three) times daily as needed for anxiety.   Marland Kitchen amLODipine (NORVASC) 10 MG tablet Take 10 mg by mouth daily.   Marland Kitchen apixaban (ELIQUIS) 5 MG TABS tablet Take 5 mg by mouth 2 (two) times  daily.   Marland Kitchen atorvastatin (LIPITOR) 80 MG tablet Take 1 tablet (80 mg total) by mouth at bedtime.   . cetirizine (ZYRTEC) 10 MG tablet Take 10 mg by mouth at bedtime.   . clopidogrel (PLAVIX) 75 MG tablet Take 1 tablet (75 mg total) by mouth daily.   . cyclobenzaprine (FLEXERIL) 10 MG tablet Take 1 tablet (10 mg total) by mouth every 8 (eight) hours as needed for muscle spasms.   . fluticasone furoate-vilanterol (BREO ELLIPTA) 100-25 MCG/INH AEPB Inhale 1 puff into the lungs daily.    . furosemide (LASIX) 80 MG tablet Take 80 mg by mouth 2 (two) times daily. 04/16/2016: Pt reports taking 40 mg twice a day   . isosorbide mononitrate (IMDUR) 60 MG 24 hr tablet Take 120 mg by mouth daily.    . lansoprazole (PREVACID) 30 MG capsule Take 30 mg by mouth daily.    Marland Kitchen lisinopril (PRINIVIL,ZESTRIL) 10 MG tablet Take 10 mg by mouth daily. 04/16/2016: Pt reports takes 5 mg once a day   . magnesium oxide (MAG-OX) 400 (241.3 Mg) MG tablet Take 400 mg by mouth daily.   . metFORMIN (GLUCOPHAGE) 500 MG tablet Take 500 mg by mouth daily with breakfast.    . metoprolol tartrate (LOPRESSOR) 25 MG tablet Take 50 mg by mouth 2 (two) times daily.    . nitroGLYCERIN (NITROSTAT)  0.4 MG SL tablet Place 0.4 mg under the tongue every 5 (five) minutes as needed for chest pain. 04/08/2016: As needed.   Marland Kitchen omega-3 acid ethyl esters (LOVAZA) 1 g capsule Take 4 g by mouth daily.   Marland Kitchen oxyCODONE-acetaminophen (PERCOCET) 10-325 MG tablet Take 1 tablet by mouth every 4 (four) hours as needed for pain.   . potassium chloride (K-DUR,KLOR-CON) 10 MEQ tablet Take 10 mEq by mouth daily.   Marland Kitchen RANEXA 500 MG 12 hr tablet TAKE 2 TABLETS BY MOUTH TWICE A DAY.   . sotalol (BETAPACE) 120 MG tablet Take 120 mg by mouth 2 (two) times daily.    . tamsulosin (FLOMAX) 0.4 MG CAPS capsule Take 0.4 mg by mouth daily after breakfast.    . tiotropium (SPIRIVA) 18 MCG inhalation capsule Place 18 mcg into inhaler and inhale daily.    No facility-administered  encounter medications on file as of 04/16/2016.    Functional Status:   In your present state of health, do you have any difficulty performing the following activities: 04/16/2016 04/03/2016  Hearing? N -  Vision? Y -  Difficulty concentrating or making decisions? N -  Walking or climbing stairs? Y -  Dressing or bathing? N -  Doing errands, shopping? Y N    Fall/Depression Screening:    PHQ 2/9 Scores 04/16/2016 09/06/2015  PHQ - 2 Score 0 0  PHQ- 9 Score - 2    Assessment:  Pleasant 61 year old gentleman, resides with spouse, plus son/grandchildren staying with pt.                           As reported by pt, waiting to move [name] [address] housing (need paper work sent in).                           HF- lungs clear, abdomen distended, reports no change in sob.   O2 sat at rest (had O2 off)- 94%.                                                             Plan:  Pt to f/u with Dr. Nehemiah [name] 6/2.             Pt to continue to check weights, record Lawrence [name] calendar provided), call MD for weight gain of >3 lbs in               A day, 5 lbs in a week.   Also, as directed by MD- take extra lasix as needed.            Plan to continue to follow pt for transition of care, f/u again telephonically 6/8.             Plan to send Dr. Caryn [name] 6/1 home visit encounter.    THN CM Care Plan Problem One        Most Recent Value   Care Plan Problem One  Risk for readmission - 2 admits in one month (chest pain)    Role Documenting the Problem One  Care Management [place] for Problem One  Active   THN Long Term Goal (31-90 days)  Pt would not readmit 31 days  post day of discharge    THN Long Term Goal Start Date  04/08/16   Interventions for Problem One Long Term Goal  Transition of care program started again.     THN CM Short Term Goal #1 (0-30 days)  Pt would not have recurrent chest pain in the next 30 days    THN CM Short Term Goal #1 Start Date  03/30/16   Interventions for Short Term Goal  #1  Reviewed discharge medications with pt.    THN CM Short Term Goal #2 (0-30 days)  Pt would continue to weigh daily, record for the next 30 days    THN CM Short Term Goal #2 Start Date  04/08/16   Interventions for Short Term Goal #2  Reinforced need to weigh daily, record [facility] calendar provided).        Zara Chess.   [facility]  [phone]

## 2016-04-17 DIAGNOSIS — I10 Essential (primary) hypertension: Secondary | ICD-10-CM | POA: Diagnosis not present

## 2016-04-17 DIAGNOSIS — R0602 Shortness of breath: Secondary | ICD-10-CM | POA: Diagnosis not present

## 2016-04-17 DIAGNOSIS — I25118 Atherosclerotic heart disease of native coronary artery with other forms of angina pectoris: Secondary | ICD-10-CM | POA: Diagnosis not present

## 2016-04-17 DIAGNOSIS — E782 Mixed hyperlipidemia: Secondary | ICD-10-CM | POA: Diagnosis not present

## 2016-04-17 LAB — CBC
Hematocrit: 34.5 % — ABNORMAL LOW (ref 37.5–51.0)
Hemoglobin: 10.8 g/dL — ABNORMAL LOW (ref 12.6–17.7)
MCH: 25.2 pg — ABNORMAL LOW (ref 26.6–33.0)
MCHC: 31.3 g/dL — ABNORMAL LOW (ref 31.5–35.7)
MCV: 80 fL (ref 79–97)
Platelets: 297 10*3/uL (ref 150–379)
RBC: 4.29 x10E6/uL (ref 4.14–5.80)
RDW: 18.5 % — ABNORMAL HIGH (ref 12.3–15.4)
WBC: 6.7 10*3/uL (ref 3.4–10.8)

## 2016-04-17 LAB — VITAMIN B12: Vitamin B-12: 206 pg/mL — ABNORMAL LOW (ref 211–946)

## 2016-04-17 LAB — IRON AND TIBC
Iron Saturation: 7 % — CL (ref 15–55)
Iron: 28 ug/dL — ABNORMAL LOW (ref 38–169)
Total Iron Binding Capacity: 381 ug/dL (ref 250–450)
UIBC: 353 ug/dL — ABNORMAL HIGH (ref 111–343)

## 2016-04-17 LAB — H PYLORI, IGM, IGG, IGA AB
H Pylori IgG: <0.9 U/mL (ref 0.0–0.8)
H pylori, IgM Abs: <9 units (ref 0.0–8.9)
H. pylori, IgA Abs: <9 units (ref 0.0–8.9)

## 2016-04-17 LAB — FERRITIN: Ferritin: 8 ng/mL — ABNORMAL LOW (ref 30–400)

## 2016-04-17 LAB — FOLATE: Folate: 9.2 ng/mL (ref >3.0)

## 2016-04-17 LAB — AMYLASE: Amylase: 78 U/L (ref 31–124)

## 2016-04-18 ENCOUNTER — Other Ambulatory Visit: Payer: Self-pay | Admitting: Family Medicine

## 2016-04-21 ENCOUNTER — Emergency Department
Admission: EM | Admit: 2016-04-21 | Discharge: 2016-04-22 | Disposition: A | Discharge status: Home / Self Care | Payer: Commercial Managed Care - HMO | Attending: Emergency Medicine | Admitting: Emergency Medicine

## 2016-04-21 ENCOUNTER — Other Ambulatory Visit: Payer: Self-pay | Admitting: Family Medicine

## 2016-04-21 DIAGNOSIS — Z79899 Other long term (current) drug therapy: Secondary | ICD-10-CM | POA: Insufficient documentation

## 2016-04-21 DIAGNOSIS — Z7984 Long term (current) use of oral hypoglycemic drugs: Secondary | ICD-10-CM | POA: Diagnosis not present

## 2016-04-21 DIAGNOSIS — R531 Weakness: Secondary | ICD-10-CM | POA: Diagnosis not present

## 2016-04-21 DIAGNOSIS — Z7902 Long term (current) use of antithrombotics/antiplatelets: Secondary | ICD-10-CM | POA: Insufficient documentation

## 2016-04-21 DIAGNOSIS — I130 Hypertensive heart and chronic kidney disease with heart failure and stage 1 through stage 4 chronic kidney disease, or unspecified chronic kidney disease: Secondary | ICD-10-CM | POA: Insufficient documentation

## 2016-04-21 DIAGNOSIS — I480 Paroxysmal atrial fibrillation: Secondary | ICD-10-CM | POA: Diagnosis not present

## 2016-04-21 DIAGNOSIS — Z87891 Personal history of nicotine dependence: Secondary | ICD-10-CM | POA: Insufficient documentation

## 2016-04-21 DIAGNOSIS — M549 Dorsalgia, unspecified: Secondary | ICD-10-CM | POA: Insufficient documentation

## 2016-04-21 DIAGNOSIS — Z951 Presence of aortocoronary bypass graft: Secondary | ICD-10-CM | POA: Diagnosis not present

## 2016-04-21 DIAGNOSIS — M5410 Radiculopathy, site unspecified: Secondary | ICD-10-CM

## 2016-04-21 DIAGNOSIS — R1013 Epigastric pain: Secondary | ICD-10-CM | POA: Diagnosis not present

## 2016-04-21 DIAGNOSIS — E785 Hyperlipidemia, unspecified: Secondary | ICD-10-CM | POA: Diagnosis not present

## 2016-04-21 DIAGNOSIS — E119 Type 2 diabetes mellitus without complications: Secondary | ICD-10-CM | POA: Diagnosis not present

## 2016-04-21 DIAGNOSIS — N189 Chronic kidney disease, unspecified: Secondary | ICD-10-CM | POA: Insufficient documentation

## 2016-04-21 DIAGNOSIS — I25110 Atherosclerotic heart disease of native coronary artery with unstable angina pectoris: Secondary | ICD-10-CM | POA: Insufficient documentation

## 2016-04-21 DIAGNOSIS — I5032 Chronic diastolic (congestive) heart failure: Secondary | ICD-10-CM | POA: Insufficient documentation

## 2016-04-21 DIAGNOSIS — R1010 Upper abdominal pain, unspecified: Secondary | ICD-10-CM | POA: Diagnosis not present

## 2016-04-21 DIAGNOSIS — J45909 Unspecified asthma, uncomplicated: Secondary | ICD-10-CM | POA: Diagnosis not present

## 2016-04-21 LAB — COMPREHENSIVE METABOLIC PANEL
ALT: 16 U/L — ABNORMAL LOW (ref 17–63)
AST: 16 U/L (ref 15–41)
Albumin: 3.6 g/dL (ref 3.5–5.0)
Alkaline Phosphatase: 65 U/L (ref 38–126)
Anion gap: 6 (ref 5–15)
BUN: 18 mg/dL (ref 6–20)
CO2: 28 mmol/L (ref 22–32)
Calcium: 9.1 mg/dL (ref 8.9–10.3)
Chloride: 105 mmol/L (ref 101–111)
Creatinine, Ser: 1.34 mg/dL — ABNORMAL HIGH (ref 0.61–1.24)
GFR calc Af Amer: >60 mL/min (ref >60)
GFR calc non Af Amer: 55 mL/min — ABNORMAL LOW (ref >60)
Glucose, Bld: 122 mg/dL — ABNORMAL HIGH (ref 65–99)
Potassium: 3.4 mmol/L — ABNORMAL LOW (ref 3.5–5.1)
Sodium: 139 mmol/L (ref 135–145)
Total Bilirubin: 0.3 mg/dL (ref 0.3–1.2)
Total Protein: 6.3 g/dL — ABNORMAL LOW (ref 6.5–8.1)

## 2016-04-21 LAB — CBC
HCT: 31.8 % — ABNORMAL LOW (ref 40.0–52.0)
Hemoglobin: 10.2 g/dL — ABNORMAL LOW (ref 13.0–18.0)
MCH: 25.2 pg — ABNORMAL LOW (ref 26.0–34.0)
MCHC: 32.2 g/dL (ref 32.0–36.0)
MCV: 78.4 fL — ABNORMAL LOW (ref 80.0–100.0)
Platelets: 239 10*3/uL (ref 150–440)
RBC: 4.06 MIL/uL — ABNORMAL LOW (ref 4.40–5.90)
RDW: 18.9 % — ABNORMAL HIGH (ref 11.5–14.5)
WBC: 7.1 10*3/uL (ref 3.8–10.6)

## 2016-04-21 LAB — URINALYSIS COMPLETE WITH MICROSCOPIC (ARMC ONLY)
Bacteria, UA: NONE SEEN
Bilirubin Urine: NEGATIVE
Glucose, UA: NEGATIVE mg/dL
Hgb urine dipstick: NEGATIVE
Ketones, ur: NEGATIVE mg/dL
Leukocytes, UA: NEGATIVE
Nitrite: NEGATIVE
Protein, ur: NEGATIVE mg/dL
Specific Gravity, Urine: 1.02 (ref 1.005–1.030)
pH: 5 (ref 5.0–8.0)

## 2016-04-21 LAB — TROPONIN I: Troponin I: <0.03 ng/mL (ref <0.031)

## 2016-04-21 LAB — LIPASE, BLOOD: Lipase: 34 U/L (ref 11–51)

## 2016-04-21 MED ORDER — OXYCODONE-ACETAMINOPHEN 10-325 MG PO TABS: 1.0000 | ORAL_TABLET | ORAL | Status: DC | PRN

## 2016-04-21 NOTE — Telephone Encounter (Signed)
Pt contacted office for refill request on the following medications: oxyCODONE-acetaminophen (PERCOCET) 10-325 MG tablet.  NI#[id number]/[id number]

## 2016-04-21 NOTE — ED Notes (Addendum)
Pt to triage via w/c with no distress noted; portable o2 in place at 2l/min via [place]; pt st "I'm anemia and weak"; c/o SHOB, upper abd pain and back pain

## 2016-04-22 ENCOUNTER — Encounter: Payer: Self-pay | Admitting: Radiology

## 2016-04-22 ENCOUNTER — Emergency Department: Payer: Commercial Managed Care - HMO

## 2016-04-22 DIAGNOSIS — R1010 Upper abdominal pain, unspecified: Secondary | ICD-10-CM | POA: Diagnosis not present

## 2016-04-22 DIAGNOSIS — R1013 Epigastric pain: Secondary | ICD-10-CM | POA: Diagnosis not present

## 2016-04-22 LAB — BRAIN NATRIURETIC PEPTIDE: B Natriuretic Peptide: 49 pg/mL (ref 0.0–100.0)

## 2016-04-22 MED ORDER — KETOROLAC TROMETHAMINE 30 MG/ML IJ SOLN
30.0000 mg | Freq: Once | INTRAMUSCULAR | Status: AC
  Administered 2016-04-22: 30 mg via INTRAVENOUS
  Filled 2016-04-22: qty 1

## 2016-04-22 MED ORDER — GI COCKTAIL ~~LOC~~
30.0000 mL | Freq: Once | ORAL | Status: AC
  Administered 2016-04-22: 30 mL via ORAL
  Filled 2016-04-22: qty 30

## 2016-04-22 MED ORDER — IOPAMIDOL (ISOVUE-300) INJECTION 61%
100.0000 mL | Freq: Once | INTRAVENOUS | Status: AC | PRN
  Administered 2016-04-22: 100 mL via INTRAVENOUS

## 2016-04-22 MED ORDER — SUCRALFATE 1 G PO TABS: 1.0000 g | ORAL_TABLET | Freq: Two times a day (BID) | ORAL | Status: DC

## 2016-04-22 MED ORDER — DIATRIZOATE MEGLUMINE & SODIUM 66-10 % PO SOLN
15.0000 mL | Freq: Once | ORAL | Status: AC
  Administered 2016-04-22: 15 mL via ORAL

## 2016-04-22 NOTE — ED Provider Notes (Signed)
[facility] Emergency Department Provider Note   ____________________________________________  Time seen: Approximately 0010 AM  I have reviewed the triage vital signs and the nursing notes.   HISTORY  Chief Complaint Weakness; Abdominal Pain; and Back Pain    HPI Lawrence [name] is a 60 y.o. male who comes into the hospital today with some abdominal pain and back pain. He also reports that he is anemic. He was told by family medicine doctor that he was anemic and they need him to see his heart doctor to approve a GI procedure. He reports that he needs a colonoscopy and endoscopy which is scheduled for this Friday. He reports that he's been feeling very weak. He cannot get up from the living room to go to the kitchen without feeling worn out. He is also having some abdominal pain. He is in his upper abdomen. He reports that he thought it was due to pancreatitis. The patient has been taking Percocet but has not been helping. The patient reports that his urine is very yellow. He also reports that his pain seems to be more in the left side of his abdomen. He does have some kidney pain on both sides as well. The patient is here for evaluation of his symptoms.The patient rates his pain an 8 out of 10 in intensity.   Past Medical History  Diagnosis Date  . CAD (coronary artery disease)     a. 2002 CABGx2 (LIMA->LAD, VG->VG->OM1);  b. 09/2012 DES->OM;  c. 03/2015 PTCA of LAD [facility]) in setting of atretic LIMA; d. 05/2015 Cath [name]): nonobs dzs; e. 06/2015 Cath ([facility]): LM nl, LAD 45p/d ISR, 50d, D1/2 small, LCX 50p/d ISR, OM1 70ost, 30 ISR, VG->OM1 50ost, 62m LIMA->LAD 99p/d - atretic, RCA dom, nl; f.cath 10/16: 40-50%(FFR 0.90) pLAD, 75% (FFR 0.77) mLAD s/p PCI/DES, oRCA 40% (FFR0.95)  . Essential hypertension   . COPD (chronic obstructive pulmonary disease) ([place]     a. Chronic bronchitis and emphysema.  . Type II diabetes mellitus ([place]   . Asthma   . Celiac disease     . Chronic diastolic CHF (congestive heart failure) ([place]     a. 06/2009 Echo: EF 60-65%, Gr 1 DD, triv AI, mildly dil LA, nl RV.  .Marland KitchenAnxiety   . History of tobacco abuse     a. Quit 2014.  .Marland KitchenPSVT (paroxysmal supraventricular tachycardia) ([place]     a. 10/2012 Noted on Zio Patch.  . Dysrhythmia   . A-fib ([place]   . Anemia   . Renal insufficiency     Patient Active Problem List   Diagnosis Date Noted  . Dyspnea 04/03/2016  . Hypotension 04/03/2016  . Chronic kidney disease 04/03/2016  . Anemia 04/03/2016  . Paroxysmal atrial fibrillation ([place] 12/23/2015  . OSA (obstructive sleep apnea) 12/10/2015  . Left inguinal hernia 11/07/2015  . Anxiety 11/07/2015  . Unstable angina ([place] 10/05/2015  . PAF (paroxysmal atrial fibrillation) ([place] 10/05/2015  . Back pain with left-sided radiculopathy 09/30/2015  . Nocturnal hypoxia 09/06/2015  . BPH (benign prostatic hyperplasia) 08/01/2015  . Chronic diastolic CHF (congestive heart failure) (H[place]   . Angina at rest ([facility]   . Chest pain 07/11/2015  . COPD (chronic obstructive pulmonary disease) ([place] 07/03/2015  . CAD (coronary artery disease) 06/26/2015  . HTN (hypertension) 06/26/2015  . DM (diabetes mellitus) (HD[place]amond [place] 06/26/2015  . Achalasia 07/24/2014  . GERD (gastroesophageal reflux disease) 06/07/2014  . Former tobacco use 04/11/2013  . HLD (hyperlipidemia) 04/09/2013  Past Surgical History  Procedure Laterality Date  . Vascular surgery    . Bypass graft    . Tonsillectomy    . Cholecystectomy    . Cardiac catheterization N/A 07/12/2015    rocedure: Left Heart Cath and Cors/Grafts Angiography;  Surgeon: Belva Crome, MD;  Location: [place] CV LAB;  Service: Cardiovascular;  Laterality: N/A;  . Esophageal dilation    . Cardiac catheterization Right 10/07/2015    Procedure: Left Heart Cath and Cors/Grafts Angiography;  Surgeon: Dionisio David, MD;  Location: [place] CV LAB;  Service: Cardiovascular;  Laterality: Right;   . Cardiac catheterization N/A 04/06/2016    Procedure: Left Heart Cath and Coronary Angiography;  Surgeon: Yolonda Kida, MD;  Location: [place] CV LAB;  Service: Cardiovascular;  Laterality: N/A;  . Cardiac catheterization  04/06/2016    Procedure: Bypass Graft Angiography;  Surgeon: Yolonda Kida, MD;  Location: [place] CV LAB;  Service: Cardiovascular;;    Current Outpatient Rx  Name  Route  Sig  Dispense  Refill  . albuterol (PROVENTIL HFA;VENTOLIN HFA) 108 (90 Base) MCG/ACT inhaler   Inhalation   Inhale 4-6 puffs into the lungs every 4 (four) hours as needed for wheezing or shortness of breath. Reported on 04/08/2016         . allopurinol (ZYLOPRIM) 100 MG tablet   Oral   Take 300 mg by mouth 2 (two) times daily.         Marland Kitchen ALPRAZolam (XANAX) 1 MG tablet   Oral   Take 0.5-1 tablets (0.5-1 mg total) by mouth 3 (three) times daily as needed for anxiety.   90 tablet   3   . amLODipine (NORVASC) 10 MG tablet   Oral   Take 10 mg by mouth daily.         Marland Kitchen apixaban (ELIQUIS) 5 MG TABS tablet   Oral   Take 5 mg by mouth 2 (two) times daily.         Marland Kitchen atorvastatin (LIPITOR) 80 MG tablet   Oral   Take 1 tablet (80 mg total) by mouth at bedtime.   90 tablet   3   . cetirizine (ZYRTEC) 10 MG tablet      TAKE ONE TABLET AT BEDTIME   30 tablet   12   . clopidogrel (PLAVIX) 75 MG tablet   Oral   Take 1 tablet (75 mg total) by mouth daily.   30 tablet   5   . cyclobenzaprine (FLEXERIL) 10 MG tablet   Oral   Take 1 tablet (10 mg total) by mouth every 8 (eight) hours as needed for muscle spasms.   30 tablet   5   . fluticasone furoate-vilanterol (BREO ELLIPTA) 100-25 MCG/INH AEPB   Inhalation   Inhale 1 puff into the lungs daily.          . furosemide (LASIX) 80 MG tablet   Oral   Take 80 mg by mouth 2 (two) times daily.         . isosorbide mononitrate (IMDUR) 60 MG 24 hr tablet   Oral   Take 120 mg by mouth daily.          .  lansoprazole (PREVACID) 30 MG capsule   Oral   Take 30 mg by mouth daily.          Marland Kitchen lisinopril (PRINIVIL,ZESTRIL) 10 MG tablet   Oral   Take 10 mg by mouth daily.         Marland Kitchen  magnesium oxide (MAG-OX) 400 (241.3 Mg) MG tablet   Oral   Take 400 mg by mouth daily.         . metFORMIN (GLUCOPHAGE) 500 MG tablet   Oral   Take 500 mg by mouth daily with breakfast.          . metoprolol tartrate (LOPRESSOR) 25 MG tablet   Oral   Take 50 mg by mouth 2 (two) times daily.          . nitroGLYCERIN (NITROSTAT) 0.4 MG SL tablet   Sublingual   Place 0.4 mg under the tongue every 5 (five) minutes as needed for chest pain.         Marland Kitchen omega-3 acid ethyl esters (LOVAZA) 1 g capsule   Oral   Take 4 g by mouth daily.         Marland Kitchen oxyCODONE-acetaminophen (PERCOCET) 10-325 MG tablet   Oral   Take 1 tablet by mouth every 4 (four) hours as needed for pain.   150 tablet   0   . potassium chloride (K-DUR,KLOR-CON) 10 MEQ tablet   Oral   Take 10 mEq by mouth daily.         Marland Kitchen RANEXA 500 MG 12 hr tablet      TAKE 2 TABLETS BY MOUTH TWICE A DAY.   120 tablet   1   . sotalol (BETAPACE) 120 MG tablet   Oral   Take 120 mg by mouth 2 (two) times daily.          . sucralfate (CARAFATE) 1 g tablet   Oral   Take 1 tablet (1 g total) by mouth 2 (two) times daily.   20 tablet   0   . tamsulosin (FLOMAX) 0.4 MG CAPS capsule   Oral   Take 0.4 mg by mouth daily after breakfast.          . tiotropium (SPIRIVA) 18 MCG inhalation capsule   Inhalation   Place 18 mcg into inhaler and inhale daily.           Allergies Demerol; Prednisone; Sulfa antibiotics; Albuterol sulfate; and Morphine sulfate  Family History  Problem Relation Age of Onset  . Heart attack Mother   . Depression Mother   . Heart disease Mother   . COPD Mother   . Hypertension Mother   . Heart attack Father   . Diabetes Father   . Depression Father   . Heart disease Father   . Cirrhosis Father   .  Parkinson's disease Brother     Social History Social History  Substance Use Topics  . Smoking status: Former Smoker    Quit date: 04/22/2013  . Smokeless tobacco: Never Used  . Alcohol Use: No     Comment: remotely quit alcohol use. Hx of heavy alcohol use.    Review of Systems Constitutional: No fever/chills Eyes: No visual changes. ENT: No sore throat. Cardiovascular: Denies chest pain. Respiratory: Denies shortness of breath. Gastrointestinal:  abdominal pain.  No nausea, no vomiting.  No diarrhea.  No constipation. Genitourinary: Negative for dysuria. Musculoskeletal: back pain. Skin: Negative for rash. Neurological: Generalized weakness  10-point ROS otherwise negative.  ____________________________________________   PHYSICAL EXAM:  VITAL SIGNS: ED Triage Vitals  Enc Vitals Group     BP 04/21/16 2318 120/68 mmHg     Pulse Rate 04/21/16 2318 74     Resp 04/21/16 2318 18     Temp 04/21/16 2318 97.7 F (36.5 C)  Temp Source 04/21/16 2318 Oral     SpO2 04/21/16 2318 93 %     Weight 04/21/16 2318 240 lb (108.863 kg)     Height 04/21/16 2318 5' 7"  (1.702 m)     Head Cir --      Peak Flow --      Pain Score 04/21/16 2317 8     Pain Loc --      Pain Edu? --      Excl. in [place]? --     Constitutional: Alert and oriented. Well appearing and in Moderate distress. Eyes: Conjunctivae are normal. PERRL. EOMI. Head: Atraumatic. Nose: No congestion/rhinnorhea. Mouth/Throat: Mucous membranes are moist.  Oropharynx non-erythematous. Cardiovascular: Normal rate, regular rhythm. Grossly normal heart sounds.  Good peripheral circulation. Respiratory: Normal respiratory effort.  No retractions. Lungs CTAB. Gastrointestinal: Soft with upper abdominal pain to palpation. No distention. Positive bowel sounds Musculoskeletal: No lower extremity tenderness nor edema.  Neurologic:  Normal speech and language. Cranial nerves II through XII are grossly intact with no focal motor  or neuro deficits Skin:  Skin is warm, dry and intact.  Psychiatric: Mood and affect are normal.   ____________________________________________   LABS (all labs ordered are listed, but only abnormal results are displayed)  Labs Reviewed  CBC - Abnormal; Notable for the following:    RBC 4.06 (*)    Hemoglobin 10.2 (*)    HCT 31.8 (*)    MCV 78.4 (*)    MCH 25.2 (*)    RDW 18.9 (*)    All other components within normal limits  COMPREHENSIVE METABOLIC PANEL - Abnormal; Notable for the following:    Potassium 3.4 (*)    Glucose, Bld 122 (*)    Creatinine, Ser 1.34 (*)    Total Protein 6.3 (*)    ALT 16 (*)    GFR calc non Af Amer 55 (*)    All other components within normal limits  URINALYSIS COMPLETEWITH MICROSCOPIC ([facility] ONLY) - Abnormal; Notable for the following:    Color, Urine YELLOW (*)    APPearance CLEAR (*)    Squamous Epithelial / LPF 0-5 (*)    All other components within normal limits  LIPASE, BLOOD  TROPONIN I  BRAIN NATRIURETIC PEPTIDE   ____________________________________________  EKG  ED ECG REPORT I, [name], the attending physician, personally viewed and interpreted this ECG.   Date: 04/22/2016  EKG Time: 2325  Rate: 70  Rhythm: normal sinus rhythm  Axis: normal  Intervals:none  ST&T Change: none  ____________________________________________  RADIOLOGY  CT abdomen and pelvis: No acute finding or change from prior, remote left ventricular infarct, cholecystectomy ____________________________________________   PROCEDURES  Procedure(s) performed: None  Critical Care performed: No  ____________________________________________   INITIAL IMPRESSION / ASSESSMENT AND PLAN / ED COURSE  Pertinent labs & imaging results that were available during my care of the patient were reviewed by me and considered in my medical decision making (see chart for details).  This is a 60 year old male who comes into the hospital today with  some abdominal pain, back pain and weakness. The patient reported that he was anemic but looking at his blood work here his H&H is at the same level that it's been for the past month. The patient did have a CT scan that was unremarkable. He reports he has an appointment to see the GI physician on Monday. At this point I feel the patient can be discharged home with the negative CT and that  he should follow-up with his GI physician. ____________________________________________   FINAL CLINICAL IMPRESSION(S) / ED DIAGNOSES  Final diagnoses:  Epigastric pain  Weakness      NEW MEDICATIONS STARTED DURING THIS VISIT:  Discharge Medication List as of 04/22/2016  4:39 AM    START taking these medications   Details  sucralfate (CARAFATE) 1 g tablet Take 1 tablet (1 g total) by mouth 2 (two) times daily., Starting 04/22/2016, Until Discontinued, Print         Note:  This document was prepared using Dragon voice recognition software and may include unintentional dictation errors.    [name], MD 04/22/16 [phone]

## 2016-04-22 NOTE — Discharge Instructions (Signed)
Abdominal Pain, Adult Many things can cause abdominal pain. Usually, abdominal pain is not caused by a disease and will improve without treatment. It can often be observed and treated at home. Your health care provider will do a physical exam and possibly order blood tests and X-rays to help determine the seriousness of your pain. However, in many cases, more time must pass before a clear cause of the pain can be found. Before that point, your health care provider may not know if you need more testing or further treatment. HOME CARE INSTRUCTIONS Monitor your abdominal pain for any changes. The following actions may help to alleviate any discomfort you are experiencing:  Only take over-the-counter or prescription medicines as directed by your health care provider.  Do not take laxatives unless directed to do so by your health care provider.  Try a clear liquid diet (broth, tea, or water) as directed by your health care provider. Slowly move to a bland diet as tolerated. SEEK MEDICAL CARE IF:  You have unexplained abdominal pain.  You have abdominal pain associated with nausea or diarrhea.  You have pain when you urinate or have a bowel movement.  You experience abdominal pain that wakes you in the night.  You have abdominal pain that is worsened or improved by eating food.  You have abdominal pain that is worsened with eating fatty foods.  You have a fever. SEEK IMMEDIATE MEDICAL CARE IF:  Your pain does not go away within 2 hours.  You keep throwing up (vomiting).  Your pain is felt only in portions of the abdomen, such as the right side or the left lower portion of the abdomen.  You pass bloody or black tarry stools. MAKE SURE YOU:  Understand these instructions.  Will watch your condition.  Will get help right away if you are not doing well or get worse.   This information is not intended to replace advice given to you by your health care provider. Make sure you discuss  any questions you have with your health care provider.   Document Released: 08/12/2005 Document Revised: 07/24/2015 Document Reviewed: 07/12/2013 Elsevier Interactive Patient Education 2016 Elsevier Inc.  Weakness Weakness is a lack of strength. It may be felt all over the body (generalized) or in one specific part of the body (focal). Some causes of weakness can be serious. You may need further medical evaluation, especially if you are elderly or you have a history of immunosuppression (such as chemotherapy or HIV), kidney disease, heart disease, or diabetes. CAUSES  Weakness can be caused by many different things, including:  Infection.  Physical exhaustion.  Internal bleeding or other blood loss that results in a lack of red blood cells (anemia).  Dehydration. This cause is more common in elderly people.  Side effects or electrolyte abnormalities from medicines, such as pain medicines or sedatives.  Emotional distress, anxiety, or depression.  Circulation problems, especially severe peripheral arterial disease.  Heart disease, such as rapid atrial fibrillation, bradycardia, or heart failure.  Nervous system disorders, such as Guillain-Barr syndrome, multiple sclerosis, or stroke. DIAGNOSIS  To find the cause of your weakness, your caregiver will take your history and perform a physical exam. Lab tests or X-rays may also be ordered, if needed. TREATMENT  Treatment of weakness depends on the cause of your symptoms and can vary greatly. HOME CARE INSTRUCTIONS   Rest as needed.  Eat a well-balanced diet.  Try to get some exercise every day.  Only take over-the-counter or  prescription medicines as directed by your caregiver. SEEK MEDICAL CARE IF:   Your weakness seems to be getting worse or spreads to other parts of your body.  You develop new aches or pains. SEEK IMMEDIATE MEDICAL CARE IF:   You cannot perform your normal daily activities, such as getting dressed and  feeding yourself.  You cannot walk up and down stairs, or you feel exhausted when you do so.  You have shortness of breath or chest pain.  You have difficulty moving parts of your body.  You have weakness in only one area of the body or on only one side of the body.  You have a fever.  You have trouble speaking or swallowing.  You cannot control your bladder or bowel movements.  You have black or bloody vomit or stools. MAKE SURE YOU:  Understand these instructions.  Will watch your condition.  Will get help right away if you are not doing well or get worse.   This information is not intended to replace advice given to you by your health care provider. Make sure you discuss any questions you have with your health care provider.   Document Released: 11/02/2005 Document Revised: 05/03/2012 Document Reviewed: 01/01/2012 Elsevier Interactive Patient Education [employer].

## 2016-04-22 NOTE — ED Notes (Signed)
Pt transported to CT via stretcher.  

## 2016-04-22 NOTE — ED Notes (Addendum)
Pt states increased fatigue and increased upper abdominal pain and kidney pain last few days. Pt states he was admitted recently for cardiac, went to cath lab, told he has small arteries that are "clogged but they can't do nothing about them." States he was d/c and told to follow up with PCP and cardiologist. PCP did blood work and told him he was anemic, pt is taking iron pills but states "they aint doing anything." Pt states he was told he might "be bleeding internally" and states he is supposed to have an endoscopy and colonoscopy to see where but denies bloody stools or bloody emesis. Pt states hx of pancreatitis. Pt is on 2L [place] all the time. Family at bedside. Pt in no distress at this time.

## 2016-04-23 ENCOUNTER — Other Ambulatory Visit: Payer: Self-pay | Admitting: *Deleted

## 2016-04-24 ENCOUNTER — Emergency Department
Admission: EM | Admit: 2016-04-24 | Discharge: 2016-04-24 | Disposition: A | Discharge status: Home / Self Care | Payer: Commercial Managed Care - HMO | Attending: Emergency Medicine | Admitting: Emergency Medicine

## 2016-04-24 ENCOUNTER — Other Ambulatory Visit: Payer: Self-pay | Admitting: Family Medicine

## 2016-04-24 ENCOUNTER — Encounter: Payer: Self-pay | Admitting: Emergency Medicine

## 2016-04-24 DIAGNOSIS — D509 Iron deficiency anemia, unspecified: Secondary | ICD-10-CM | POA: Diagnosis not present

## 2016-04-24 DIAGNOSIS — R110 Nausea: Secondary | ICD-10-CM | POA: Diagnosis not present

## 2016-04-24 DIAGNOSIS — Z7902 Long term (current) use of antithrombotics/antiplatelets: Secondary | ICD-10-CM | POA: Insufficient documentation

## 2016-04-24 DIAGNOSIS — I25110 Atherosclerotic heart disease of native coronary artery with unstable angina pectoris: Secondary | ICD-10-CM | POA: Insufficient documentation

## 2016-04-24 DIAGNOSIS — N189 Chronic kidney disease, unspecified: Secondary | ICD-10-CM | POA: Insufficient documentation

## 2016-04-24 DIAGNOSIS — I130 Hypertensive heart and chronic kidney disease with heart failure and stage 1 through stage 4 chronic kidney disease, or unspecified chronic kidney disease: Secondary | ICD-10-CM | POA: Diagnosis not present

## 2016-04-24 DIAGNOSIS — Z87891 Personal history of nicotine dependence: Secondary | ICD-10-CM | POA: Diagnosis not present

## 2016-04-24 DIAGNOSIS — I5032 Chronic diastolic (congestive) heart failure: Secondary | ICD-10-CM | POA: Diagnosis not present

## 2016-04-24 DIAGNOSIS — R112 Nausea with vomiting, unspecified: Secondary | ICD-10-CM | POA: Insufficient documentation

## 2016-04-24 DIAGNOSIS — J449 Chronic obstructive pulmonary disease, unspecified: Secondary | ICD-10-CM | POA: Diagnosis not present

## 2016-04-24 DIAGNOSIS — J45909 Unspecified asthma, uncomplicated: Secondary | ICD-10-CM | POA: Diagnosis not present

## 2016-04-24 DIAGNOSIS — I480 Paroxysmal atrial fibrillation: Secondary | ICD-10-CM | POA: Insufficient documentation

## 2016-04-24 DIAGNOSIS — Z951 Presence of aortocoronary bypass graft: Secondary | ICD-10-CM | POA: Diagnosis not present

## 2016-04-24 DIAGNOSIS — Z7984 Long term (current) use of oral hypoglycemic drugs: Secondary | ICD-10-CM | POA: Insufficient documentation

## 2016-04-24 DIAGNOSIS — Z79899 Other long term (current) drug therapy: Secondary | ICD-10-CM | POA: Insufficient documentation

## 2016-04-24 DIAGNOSIS — G8929 Other chronic pain: Secondary | ICD-10-CM | POA: Diagnosis not present

## 2016-04-24 DIAGNOSIS — E785 Hyperlipidemia, unspecified: Secondary | ICD-10-CM | POA: Diagnosis not present

## 2016-04-24 DIAGNOSIS — R1012 Left upper quadrant pain: Secondary | ICD-10-CM | POA: Diagnosis not present

## 2016-04-24 DIAGNOSIS — R109 Unspecified abdominal pain: Secondary | ICD-10-CM | POA: Diagnosis not present

## 2016-04-24 DIAGNOSIS — E1122 Type 2 diabetes mellitus with diabetic chronic kidney disease: Secondary | ICD-10-CM | POA: Insufficient documentation

## 2016-04-24 DIAGNOSIS — R140 Abdominal distension (gaseous): Secondary | ICD-10-CM | POA: Diagnosis not present

## 2016-04-24 LAB — COMPREHENSIVE METABOLIC PANEL
ALT: 18 U/L (ref 17–63)
AST: 15 U/L (ref 15–41)
Albumin: 4 g/dL (ref 3.5–5.0)
Alkaline Phosphatase: 68 U/L (ref 38–126)
Anion gap: 5 (ref 5–15)
BUN: 18 mg/dL (ref 6–20)
CO2: 32 mmol/L (ref 22–32)
Calcium: 9.3 mg/dL (ref 8.9–10.3)
Chloride: 103 mmol/L (ref 101–111)
Creatinine, Ser: 1.22 mg/dL (ref 0.61–1.24)
GFR calc Af Amer: >60 mL/min (ref >60)
GFR calc non Af Amer: >60 mL/min (ref >60)
Glucose, Bld: 95 mg/dL (ref 65–99)
Potassium: 3.7 mmol/L (ref 3.5–5.1)
Sodium: 140 mmol/L (ref 135–145)
Total Bilirubin: 0.4 mg/dL (ref 0.3–1.2)
Total Protein: 7 g/dL (ref 6.5–8.1)

## 2016-04-24 LAB — TROPONIN I: Troponin I: <0.03 ng/mL (ref <0.031)

## 2016-04-24 LAB — CBC
HCT: 33.6 % — ABNORMAL LOW (ref 40.0–52.0)
Hemoglobin: 10.8 g/dL — ABNORMAL LOW (ref 13.0–18.0)
MCH: 25.4 pg — ABNORMAL LOW (ref 26.0–34.0)
MCHC: 32.2 g/dL (ref 32.0–36.0)
MCV: 78.8 fL — ABNORMAL LOW (ref 80.0–100.0)
Platelets: 257 10*3/uL (ref 150–440)
RBC: 4.26 MIL/uL — ABNORMAL LOW (ref 4.40–5.90)
RDW: 18.8 % — ABNORMAL HIGH (ref 11.5–14.5)
WBC: 6.1 10*3/uL (ref 3.8–10.6)

## 2016-04-24 LAB — LIPASE, BLOOD: Lipase: 27 U/L (ref 11–51)

## 2016-04-24 MED ORDER — DICYCLOMINE HCL 20 MG PO TABS: 20.0000 mg | ORAL_TABLET | Freq: Three times a day (TID) | ORAL | Status: DC | PRN

## 2016-04-24 MED ORDER — ONDANSETRON HCL 4 MG PO TABS: 4.0000 mg | ORAL_TABLET | Freq: Three times a day (TID) | ORAL | Status: DC | PRN

## 2016-04-24 NOTE — ED Notes (Signed)
Pt. Going home with family. 

## 2016-04-24 NOTE — Patient Outreach (Signed)
Transition of care call (week 3, discharged 5/23).  Pt reports on recent ED visit 6/6- went about his stomach, told to keep appointment with GI MD which is tomorrow.  Pt reports stomach still hurts, abdomen still distended.  Pt reports still sob, weights range 240-242 lbs., weighing with his clothes on.  RN CM reviewed with pt again most accurate weight is in am- after urinating, before eating, not fully clothed to which pt voiced understanding.    Pt reports saw Lung MD 6/6, to see Heart MD in 2 weeks.  Pt reports he is concerned about finding a place to live, was informed will  get an eviction notice 6/13. Pt reports he is looking into 2 places in [place], go by your income but if it does not work out has a place to go but not sure if they will take him with Oxygen.   RN CM discussed with pt  plan to continue to follow for transition of care, f/u again telephonically 6/15.     Zara Chess.   [place] [facility]  [phone]

## 2016-04-24 NOTE — ED Provider Notes (Signed)
[facility] Emergency Department Provider Note   ____________________________________________  Time seen: ~1115  I have reviewed the triage vital signs and the nursing notes.   HISTORY  Chief Complaint Abdominal Pain   History limited by: Not Limited   HPI Lawrence [name] is a 60 y.o. male history of chronic abdominal pain and presents to the emergency department today because of concerns for abdominal pain and pancreatitis. The patient has been having abdominal pain for the past few days. Was seen in the emergency department 3 days ago. That time blood work and CT scan were done which did not show any concerning findings. Patient followed up with his GI doctor today and on blood draw he was found to have an elevated lipase. He was told to present to the emergency department. Significant pain. He states the pain did become more severe earlier tonight. States it is located in the center upper stomach. It was worse with eating. He states he thought he had a subjective fever earlier today.     Past Medical History  Diagnosis Date  . CAD (coronary artery disease)     a. 2002 CABGx2 (LIMA->LAD, VG->VG->OM1);  b. 09/2012 DES->OM;  c. 03/2015 PTCA of LAD [facility]) in setting of atretic LIMA; d. 05/2015 Cath [facility]): nonobs dzs; e. 06/2015 Cath ([facility]): LM nl, LAD 45p/d ISR, 50d, D1/2 small, LCX 50p/d ISR, OM1 70ost, 30 ISR, VG->OM1 50ost, 64m LIMA->LAD 99p/d - atretic, RCA dom, nl; f.cath 10/16: 40-50%(FFR 0.90) pLAD, 75% (FFR 0.77) mLAD s/p PCI/DES, oRCA 40% (FFR0.95)  . Essential hypertension   . COPD (chronic obstructive pulmonary disease) ([facility]     a. Chronic bronchitis and emphysema.  . Type II diabetes mellitus ([facility]kins   . Asthma   . Celiac disease   . Chronic diastolic CHF (congestive heart failure) ([facility]     a. 06/2009 Echo: EF 60-65%, Gr 1 DD, triv AI, mildly dil LA, nl RV.  .Marland KitchenAnxiety   . History of tobacco abuse     a. Quit 2014.  .Marland KitchenPSVT (paroxysmal  supraventricular tachycardia) (HFrank[place]     a. 10/2012 Noted on Zio Patch.  . Dysrhythmia   . A-fib ([facility]   . Anemia   . Renal insufficiency     Patient Active Problem List   Diagnosis Date Noted  . Dyspnea 04/03/2016  . Hypotension 04/03/2016  . Chronic kidney disease 04/03/2016  . Anemia 04/03/2016  . Paroxysmal atrial fibrillation ([facility] 12/23/2015  . OSA (obstructive sleep apnea) 12/10/2015  . Left inguinal hernia 11/07/2015  . Anxiety 11/07/2015  . Unstable angina ([facility] 10/05/2015  . PAF (paroxysmal atrial fibrillation) ([place] 10/05/2015  . Back pain with left-sided radiculopathy 09/30/2015  . Nocturnal hypoxia 09/06/2015  . BPH (benign prostatic hyperplasia) 08/01/2015  . Chronic diastolic CHF (congestive heart failure) ([place]   . Angina at rest ([facility]   . Chest pain 07/11/2015  . COPD (chronic obstructive pulmonary disease) (H[place] 07/03/2015  . CAD (coronary artery disease) 06/26/2015  . HTN (hypertension) 06/26/2015  . DM (diabetes mellitus) (HKey[place] 06/26/2015  . Achalasia 07/24/2014  . GERD (gastroesophageal reflux disease) 06/07/2014  . Former tobacco use 04/11/2013  . HLD (hyperlipidemia) 04/09/2013    Past Surgical History  Procedure Laterality Date  . Vascular surgery    . Bypass graft    . Tonsillectomy    . Cholecystectomy    . Cardiac catheterization N/A 07/12/2015    rocedure: Left Heart Cath and Cors/Grafts Angiography;  Surgeon: HBelva Crome MD;  Location: [place] [facility];  Service: Cardiovascular;  Laterality: N/A;  . Esophageal dilation    . Cardiac catheterization Right 10/07/2015    Procedure: Left Heart Cath and Cors/Grafts Angiography;  Surgeon: Dionisio David, MD;  Location: [place] [facility];  Service: Cardiovascular;  Laterality: Right;  . Cardiac catheterization N/A 04/06/2016    Procedure: Left Heart Cath and Coronary Angiography;  Surgeon: Yolonda Kida, MD;  Location: [place] [facility];  Service: Cardiovascular;  Laterality: N/A;  .  Cardiac catheterization  04/06/2016    Procedure: Bypass Graft Angiography;  Surgeon: Yolonda Kida, MD;  Location: [place] [facility];  Service: Cardiovascular;;    Current Outpatient Rx  Name  Route  Sig  Dispense  Refill  . albuterol (PROVENTIL HFA;VENTOLIN HFA) 108 (90 Base) MCG/ACT inhaler   Inhalation   Inhale 4-6 puffs into the lungs every 4 (four) hours as needed for wheezing or shortness of breath. Reported on 04/08/2016         . allopurinol (ZYLOPRIM) 100 MG tablet   Oral   Take 300 mg by mouth 2 (two) times daily.         Marland Kitchen ALPRAZolam (XANAX) 1 MG tablet   Oral   Take 0.5-1 tablets (0.5-1 mg total) by mouth 3 (three) times daily as needed for anxiety.   90 tablet   3   . amLODipine (NORVASC) 10 MG tablet   Oral   Take 10 mg by mouth daily.         Marland Kitchen apixaban (ELIQUIS) 5 MG TABS tablet   Oral   Take 5 mg by mouth 2 (two) times daily.         Marland Kitchen atorvastatin (LIPITOR) 80 MG tablet   Oral   Take 1 tablet (80 mg total) by mouth at bedtime.   90 tablet   3   . cetirizine (ZYRTEC) 10 MG tablet      TAKE ONE TABLET AT BEDTIME   30 tablet   12   . clopidogrel (PLAVIX) 75 MG tablet   Oral   Take 1 tablet (75 mg total) by mouth daily.   30 tablet   5   . cyclobenzaprine (FLEXERIL) 10 MG tablet   Oral   Take 1 tablet (10 mg total) by mouth every 8 (eight) hours as needed for muscle spasms.   30 tablet   5   . DULERA 200-5 MCG/ACT AERO   Inhalation   Inhale 2 puffs into the lungs daily.           Dispense as written.   . fluticasone furoate-vilanterol (BREO ELLIPTA) 100-25 MCG/INH AEPB   Inhalation   Inhale 1 puff into the lungs daily.          . furosemide (LASIX) 80 MG tablet   Oral   Take 80 mg by mouth 2 (two) times daily.         . isosorbide mononitrate (IMDUR) 60 MG 24 hr tablet   Oral   Take 120 mg by mouth daily. Reported on 04/24/2016         . lansoprazole (PREVACID) 30 MG capsule   Oral   Take 30 mg by mouth daily.           Marland Kitchen lisinopril (PRINIVIL,ZESTRIL) 5 MG tablet   Oral   Take 1 tablet by mouth daily.         . magnesium oxide (MAG-OX) 400 (241.3 Mg) MG tablet   Oral  Take 400 mg by mouth daily.         . metFORMIN (GLUCOPHAGE) 500 MG tablet   Oral   Take 500 mg by mouth daily with breakfast.          . metoprolol tartrate (LOPRESSOR) 25 MG tablet   Oral   Take 50 mg by mouth 2 (two) times daily.          . nitroGLYCERIN (NITROSTAT) 0.4 MG SL tablet   Sublingual   Place 0.4 mg under the tongue every 5 (five) minutes as needed for chest pain.         Marland Kitchen omega-3 acid ethyl esters (LOVAZA) 1 g capsule   Oral   Take 4 g by mouth daily.         Marland Kitchen oxyCODONE-acetaminophen (PERCOCET) 10-325 MG tablet   Oral   Take 1 tablet by mouth every 4 (four) hours as needed for pain.   150 tablet   0   . potassium chloride (K-DUR,KLOR-CON) 10 MEQ tablet   Oral   Take 10 mEq by mouth daily.         Marland Kitchen RANEXA 500 MG 12 hr tablet      TAKE 2 TABLETS BY MOUTH TWICE A DAY.   120 tablet   1   . sotalol (BETAPACE) 120 MG tablet   Oral   Take 120 mg by mouth 2 (two) times daily.          . sucralfate (CARAFATE) 1 g tablet   Oral   Take 1 tablet (1 g total) by mouth 2 (two) times daily.   20 tablet   0   . tamsulosin (FLOMAX) 0.4 MG CAPS capsule   Oral   Take 0.4 mg by mouth daily after breakfast.          . tiotropium (SPIRIVA) 18 MCG inhalation capsule   Inhalation   Place 18 mcg into inhaler and inhale daily.         Marland Kitchen GAVILYTE-N WITH FLAVOR PACK 420 g solution   Oral   Take 1 Dose by mouth once. Reported on 04/24/2016           Dispense as written.     Allergies Demerol; Prednisone; Sulfa antibiotics; Albuterol sulfate; and Morphine sulfate  Family History  Problem Relation Age of Onset  . Heart attack Mother   . Depression Mother   . Heart disease Mother   . COPD Mother   . Hypertension Mother   . Heart attack Father   . Diabetes Father   .  Depression Father   . Heart disease Father   . Cirrhosis Father   . Parkinson's disease Brother     Social History Social History  Substance Use Topics  . Smoking status: Former Smoker    Quit date: 04/22/2013  . Smokeless tobacco: Never Used  . Alcohol Use: No     Comment: remotely quit alcohol use. Hx of heavy alcohol use.    Review of Systems  Constitutional: Negative for fever. Cardiovascular: Negative for chest pain. Respiratory: Negative for shortness of breath. Gastrointestinal: Positive for abdominal pain and nausea Neurological: Negative for headaches, focal weakness or numbness.  10-point ROS otherwise negative.  ____________________________________________   PHYSICAL EXAM:  VITAL SIGNS: ED Triage Vitals  Enc Vitals Group     BP 04/24/16 1954 132/74 mmHg     Pulse Rate 04/24/16 1954 72     Resp 04/24/16 1954 20     Temp 04/24/16 1954 97.8  F (36.6 C)     Temp Source 04/24/16 1954 Oral     SpO2 04/24/16 1954 97 %     Weight 04/24/16 1954 244 lb (110.678 kg)     Height 04/24/16 1954 5' 7"  (1.702 m)     Head Cir --      Peak Flow --      Pain Score 04/24/16 1955 9   Constitutional: Sleeping upon entering the room. Easily awoken. No acute distress. Eyes: Conjunctivae are normal. PERRL. Normal extraocular movements. ENT   Head: Normocephalic and atraumatic.   Nose: No congestion/rhinnorhea.   Mouth/Throat: Mucous membranes are moist.   Neck: No stridor. Hematological/Lymphatic/Immunilogical: No cervical lymphadenopathy. Cardiovascular: Normal rate, regular rhythm.  No murmurs, rubs, or gallops. Respiratory: Normal respiratory effort without tachypnea nor retractions. Breath sounds are clear and equal bilaterally. No wheezes/rales/rhonchi. Gastrointestinal: Soft and nontender. No distention. There is no CVA tenderness. Genitourinary: Deferred Musculoskeletal: Normal range of motion in all extremities. No joint effusions.  No lower extremity  tenderness nor edema. Neurologic:  Normal speech and language. No gross focal neurologic deficits are appreciated.  Skin:  Skin is warm, dry and intact. No rash noted. Psychiatric: Mood and affect are normal. Speech and behavior are normal. Patient exhibits appropriate insight and judgment.  ____________________________________________    LABS (pertinent positives/negatives)  Labs Reviewed  CBC - Abnormal; Notable for the following:    RBC 4.26 (*)    Hemoglobin 10.8 (*)    HCT 33.6 (*)    MCV 78.8 (*)    MCH 25.4 (*)    RDW 18.8 (*)    All other components within normal limits  LIPASE, BLOOD  COMPREHENSIVE METABOLIC PANEL  TROPONIN I  URINALYSIS COMPLETEWITH MICROSCOPIC ([facility] ONLY)     ____________________________________________   EKG  I, Nance Pear, attending physician, personally viewed and interpreted this EKG  EKG Time: 2009 Rate: 69 Rhythm: normal sinus rhythm Axis: normal Intervals: qtc 443 QRS: narrow ST changes: no st elevation Impression: normal ekg   ____________________________________________    RADIOLOGY  None  ____________________________________________   PROCEDURES  Procedure(s) performed: None  Critical Care performed: No  ____________________________________________   INITIAL IMPRESSION / ASSESSMENT AND PLAN / ED COURSE  Pertinent labs & imaging results that were available during my care of the patient were reviewed by me and considered in my medical decision making (see chart for details).  Patient with history of chronic abdominal pain presents to the emergency department today because of concerns for abdominal pain and elevated lipase and outpatient labs. Lipase here in the emergency department is within normal limits. This was drawn roughly 9 hours after initial elevated lipase. Additionally the patient and does appear well controlled at this point. Furthermore the patient had a CT scan done 3 days ago which was  negative. Given history of chronic abdominal pain and normal blood work and vital signs do not think repeat emergent imaging is warranted. Patient states he does have oxycodone at home already for chronic back pain. Will give antiemetics as well as Bentyl. Will have patient follow-up with his GI doctor.  ____________________________________________   FINAL CLINICAL IMPRESSION(S) / ED DIAGNOSES  Final diagnoses:  Abdominal pain, unspecified abdominal location     Note: This dictation was prepared with Dragon dictation. Any transcriptional errors that result from this process are unintentional    Nance Pear, MD 04/24/16 2330

## 2016-04-24 NOTE — ED Notes (Signed)
Patient states that he developed left side abd pain on tues. Patient states that he has a history of pancreatitis and that it feels the same. Patient states that he has increase pain with eating, nausea and vomiting. Patient states that he was seen here Tuesday for the same and was told that his blood work and ct scan were normal. Patient states that he was seen by his gi dr today and had lab work done and was told that he does have pancreatitis and to come to the ER.

## 2016-04-24 NOTE — ED Notes (Signed)
Pt. States he was told by his gastritis doctor that he has pancreatitis and was told to come to ED.  Pt. States upper left abdominal pain.

## 2016-04-24 NOTE — Discharge Instructions (Signed)
Please seek medical attention for any high fevers, chest pain, shortness of breath, change in behavior, persistent vomiting, bloody stool or any other new or concerning symptoms.   Abdominal Pain, Adult Many things can cause belly (abdominal) pain. Most times, the belly pain is not dangerous. Many cases of belly pain can be watched and treated at home. HOME CARE   Do not take medicines that help you go poop (laxatives) unless told to by your doctor.  Only take medicine as told by your doctor.  Eat or drink as told by your doctor. Your doctor will tell you if you should be on a special diet. GET HELP IF:  You do not know what is causing your belly pain.  You have belly pain while you are sick to your stomach (nauseous) or have runny poop (diarrhea).  You have pain while you pee or poop.  Your belly pain wakes you up at night.  You have belly pain that gets worse or better when you eat.  You have belly pain that gets worse when you eat fatty foods.  You have a fever. GET HELP RIGHT AWAY IF:   The pain does not go away within 2 hours.  You keep throwing up (vomiting).  The pain changes and is only in the right or left part of the belly.  You have bloody or tarry looking poop. MAKE SURE YOU:   Understand these instructions.  Will watch your condition.  Will get help right away if you are not doing well or get worse.   This information is not intended to replace advice given to you by your health care provider. Make sure you discuss any questions you have with your health care provider.   Document Released: 04/20/2008 Document Revised: 11/23/2014 Document Reviewed: 07/12/2013 Elsevier Interactive Patient Education [employer].

## 2016-04-27 DIAGNOSIS — G4733 Obstructive sleep apnea (adult) (pediatric): Secondary | ICD-10-CM | POA: Diagnosis not present

## 2016-04-27 DIAGNOSIS — R748 Abnormal levels of other serum enzymes: Secondary | ICD-10-CM | POA: Diagnosis not present

## 2016-04-27 DIAGNOSIS — J9611 Chronic respiratory failure with hypoxia: Secondary | ICD-10-CM | POA: Diagnosis not present

## 2016-04-27 DIAGNOSIS — R0602 Shortness of breath: Secondary | ICD-10-CM | POA: Diagnosis not present

## 2016-04-27 DIAGNOSIS — F17211 Nicotine dependence, cigarettes, in remission: Secondary | ICD-10-CM | POA: Diagnosis not present

## 2016-04-29 NOTE — Addendum Note (Signed)
Addended by: Arville Care on: 04/29/2016 03:11 PM   Modules accepted: Level of Service, SmartSet

## 2016-04-29 NOTE — Progress Notes (Signed)
This encounter was created in error - please disregard.

## 2016-04-30 ENCOUNTER — Other Ambulatory Visit: Payer: Self-pay | Admitting: *Deleted

## 2016-04-30 NOTE — Patient Outreach (Signed)
Transition of care call successful (week 4, discharged 5/23).  Pt reports not doing good, still looking for a place to live, currently in motel for now, has  just enough left for 3 days.  Pt reports talked to his pastor,will  try to help plus he is going  to f/u with [facility] housing.    Pt reports on recent ED visit 6/9- saw GI MD that day, f/u at ED told had pancreatitis.  Pt reports they sent him home with stomach medication which is helping, f/u with GI MD again, labs done, shows normal, told to eat normal as tolerated.  Pt reports he has very little chest pain, no swelling at this time, unable to weigh now- in motel.   Discussed with pt coworker Pam (covering for this RN CM)  to f/u again next week for final transition of care call,  And this RN CM to f/u the following week to schedule home visit.     Zara Chess.   [place] Care Management  [phone]

## 2016-05-04 ENCOUNTER — Other Ambulatory Visit: Payer: Self-pay

## 2016-05-04 DIAGNOSIS — I5041 Acute combined systolic (congestive) and diastolic (congestive) heart failure: Secondary | ICD-10-CM

## 2016-05-04 NOTE — Patient Outreach (Signed)
This RNCM made telephone contact with patient who identified himself by providing date of birth and address. Patient states he was evicted from apartment last week and is now in a hotel. Patient states his church has put them up in the hotel to Sunday June 25.  Patient stated he and his family left with very little. Patient states he is feeling fine, just this situation with being evicted. Patient states he ha not weighed because he was not able to bring his scales. Patient states he and his family have an appointment to go remove their belongings from apartment today.  Patient reports their plan including housing through [place], [place] housing.  This RNCM and patient discussed [facility] LCSW Program. Patient was agreeable to referral. Patient encouraged to call if further assistance is needed.  Patient also informed that his assigned case manger would be returning from vacation next week.

## 2016-05-06 ENCOUNTER — Other Ambulatory Visit: Payer: Self-pay | Admitting: *Deleted

## 2016-05-06 NOTE — Patient Outreach (Signed)
[facility] [facility]) Care Management  05/06/2016  Lawrence [name] 02/03/1956 [id number]   Phone call to patient at the request of RNCM to provide community resources for housing.  Per patient, he and his family were recently evicted.(spouse, their son, son's fiancee, 3 children).  Per patient, their church has paid for them to stay at the [facility] lodge until Sunday.  After Sunday, they are not sure where they will go. Patient has contacted [facility], however they do not have a residence that is large enough for a family of 7.  Per patient, his wife works as a Quarry manager for a home care agency,  however their care broke down and she is unable to get to work. Patient and son both receive disability.  Son's fiancee is the only one working at this time.  Patient given phone number for Emergency/Crisis housing services: [facility] [phone].   Plan:  This social worker will follow up with patient regarding housing referrals provided on 05/07/16.   [place], [place] Management [phone]  Patient agrees to contact these agencies for housing assistance

## 2016-05-07 ENCOUNTER — Other Ambulatory Visit: Payer: Self-pay | Admitting: *Deleted

## 2016-05-07 ENCOUNTER — Ambulatory Visit: Payer: Self-pay | Admitting: *Deleted

## 2016-05-07 NOTE — Patient Outreach (Signed)
[facility]) Care Management  05/07/2016  JALON SQUIER 1956-09-29 [id number]   Phone call to patient to follow up on emergency housing resources provided. Per patient, he has not contacted the agencies yet, however will by tomorrow.  He requested that I call him back in the morning.   Plan:  This social worker will contact patient on 05/08/16 to follow up on emergency housing resources provided.    Sheralyn Boatman [facility] Care Management [phone]

## 2016-05-08 ENCOUNTER — Other Ambulatory Visit: Payer: Commercial Managed Care - HMO | Admitting: *Deleted

## 2016-05-08 ENCOUNTER — Encounter: Payer: Self-pay | Admitting: *Deleted

## 2016-05-08 NOTE — Patient Outreach (Signed)
[place] [facility]) Care Management  05/08/2016  Lawrence [name] June 30, 1956 [id number]   Phone call to patient to follow up on Emergency/Crisis Services resources provided. Per patient, he called the resources given, however there was no answer.  Contact information provided again for [employer] and [employer].  Patient discussed needing to get his car fixed as well.  Patient currently working with a couple of local mechanics on estimates for the repairs needed.  Per patient, they will stay in the [facility] until they can get their car repaired.  Plan:  Patient will contact the Emergency/Crisis Services resources provided for assistance with housing resources.            This Education officer, museum will follow up with patient within the next 7 business days.   Sheralyn Boatman [facility] Care Management [phone]

## 2016-05-10 DIAGNOSIS — G4733 Obstructive sleep apnea (adult) (pediatric): Secondary | ICD-10-CM | POA: Diagnosis not present

## 2016-05-11 ENCOUNTER — Other Ambulatory Visit: Payer: Self-pay | Admitting: *Deleted

## 2016-05-11 NOTE — Patient Outreach (Signed)
Final transition of care call (discharged 5/23).   Pt reports did f/u on referrals from [place] worker, put on the list for Emergency housing.  Pt reports current car is not fixable, checking on getting another vehicle.  Pt reports still staying at [facility] until 6/27, church helping.   Pt reports unable to go to [employer], no vehicle.  Pt reports no stomach issues at this time, not weighing as scales are in storage, swelling okay now.   Pt reports stay sob because he has COPD. RN CM discussed with pt today is final transition of care call, plan to f/u again 7/5, check on status, schedule home visit (provide ongoing community nurse case management) to which pt agreed.     Plan to collaborate with [facility]  Social worker.  Care plan updated.   Plan to f/u again telephonically 7/5.    Zara Chess.   [place] Care Management  [phone]

## 2016-05-12 DIAGNOSIS — J439 Emphysema, unspecified: Secondary | ICD-10-CM | POA: Diagnosis not present

## 2016-05-12 DIAGNOSIS — G4710 Hypersomnia, unspecified: Secondary | ICD-10-CM | POA: Diagnosis not present

## 2016-05-12 DIAGNOSIS — J449 Chronic obstructive pulmonary disease, unspecified: Secondary | ICD-10-CM | POA: Diagnosis not present

## 2016-05-12 DIAGNOSIS — I10 Essential (primary) hypertension: Secondary | ICD-10-CM | POA: Diagnosis not present

## 2016-05-15 ENCOUNTER — Other Ambulatory Visit: Payer: Self-pay | Admitting: *Deleted

## 2016-05-15 ENCOUNTER — Encounter: Payer: Self-pay | Admitting: *Deleted

## 2016-05-15 NOTE — Patient Outreach (Signed)
[place] [facility]) Care Management  05/15/2016  Lawrence [name] Nov 23, 1955 [id number]   Phone call to patient to follow up on community resources previously provided.  Per patient, he is still at the [facility].  He has not had his car repaired.  Patient has contacted [facility] and they are currently on the  waiting list for 2 two bedroom apartments to accommodate he and his wife as well as his son, his son's fiancee and their 3 children.  Patient has  also contacted [name].  Housing offered, however the family would need to split up and he did not want to split his family up.  Patient also contacted [facility], he spoke to a counselor there who has put him on the waiting list for assistance with a car through  "[employer]" program.  Patient verbalized no need for food assistance and has plans to remain at the motel until alternative housing is located.  Patient verbalized having no further needs at this time.  All resources for emergency/crisis services provided to patient . Case to be closed to social work at this time.   Sheralyn Boatman [facility] Care Management [phone]

## 2016-05-20 ENCOUNTER — Other Ambulatory Visit: Payer: Self-pay | Admitting: *Deleted

## 2016-05-20 NOTE — Patient Outreach (Signed)
Attempt made to contact pt, check on status,schedule home visit as final transition of care call done 6/26.     HIPAA compliant voice message left with contact name and number.    If no response, will try again.     Zara Chess.   [place] Care Management  [phone]

## 2016-05-21 ENCOUNTER — Other Ambulatory Visit: Payer: Self-pay | Admitting: Family Medicine

## 2016-05-21 DIAGNOSIS — M5410 Radiculopathy, site unspecified: Secondary | ICD-10-CM

## 2016-05-21 MED ORDER — OXYCODONE-ACETAMINOPHEN 10-325 MG PO TABS: 1.0000 | ORAL_TABLET | ORAL | Status: DC | PRN

## 2016-05-21 NOTE — Telephone Encounter (Signed)
Pt requesting refill of oxyCODONE-acetaminophen (PERCOCET) 10-325 MG tablet

## 2016-05-25 ENCOUNTER — Other Ambulatory Visit: Payer: Self-pay | Admitting: *Deleted

## 2016-05-25 NOTE — Patient Outreach (Signed)
Follow up phone call successful.  Spoke with pt, HIPAA verified (dob, previous home address, currently staying at [facility] [address] [place] [address]).  Pt reports right now in process of finding an apartment, getting a car, wife having dental problems.   Pt reports still on waiting list for [place], [employer] (car).  Pt reports has a little bit of swelling, not bad, sob all the time- has machines with me now.   Pt reports he had to cancel two MD appointments because of transportation, one with heart MD- a couple of weeks ago.   RN CM discussed with pt transportation benefit that is available through his insurance, can be used for MD appointments.   As discussed, pt to f/u about transportation benefit, schedule appointment with Heart MD.   Also as discussed, RN CM to do a home visit with pt tomorrow.     Plan to f/u with pt 7/11- home visit.    Zara Chess.   [place] Care Management  [phone]

## 2016-05-26 ENCOUNTER — Other Ambulatory Visit: Payer: Self-pay | Admitting: Family Medicine

## 2016-05-26 ENCOUNTER — Other Ambulatory Visit: Payer: Self-pay | Admitting: *Deleted

## 2016-05-26 ENCOUNTER — Other Ambulatory Visit: Payer: Self-pay

## 2016-05-26 DIAGNOSIS — F419 Anxiety disorder, unspecified: Secondary | ICD-10-CM

## 2016-05-26 MED ORDER — ALPRAZOLAM 1 MG PO TABS: 0.5000 mg | ORAL_TABLET | Freq: Three times a day (TID) | ORAL | Status: DC | PRN

## 2016-05-26 NOTE — Telephone Encounter (Signed)
Please call in alprazolam.

## 2016-05-27 ENCOUNTER — Encounter: Payer: Self-pay | Admitting: *Deleted

## 2016-05-27 NOTE — Patient Outreach (Signed)
[facility]) Care Management   05/26/2016  Lawrence [name] Jan 05, 1956 [id number]  Lawrence [name] is an 60 y.o. male  Subjective:  Pt reports remains on waiting list for [employer], has application filled out for [employer] But  have no way to get it back,car broken.    Pt reports he cancelled both Heart and Lung   MD appointments because of no transportation.  Pt reports to stay at [facility] but money is getting  Low.  Pt reports remains on Wheels for work list.   Pt reports using his CPAP at night, realizes cannot  Go without his oxygen.   Pt reports he is trying to monitor his sodium intake, currently no edema, taking all of his  Medications.   Objective:   Filed Vitals:   05/26/16 1423  BP: 106/64  Pulse: 64  Resp: 16    ROS  Physical Exam  Constitutional: He is oriented to person, place, and time. He appears well-developed and well-nourished.  Cardiovascular: Normal rate, regular rhythm and normal heart sounds.   Respiratory: Effort normal and breath sounds normal.  Continuous O 2 2 Liters [place]   GI: Soft. He exhibits distension.  Per pt, distended abdomen norm.   Musculoskeletal: Normal range of motion. He exhibits no edema.  Neurological: He is alert and oriented to person, place, and time.  Skin: Skin is warm and dry.  Psychiatric: He has a normal mood and affect. His behavior is normal. Judgment and thought content normal.    Encounter Medications:   Outpatient Encounter Prescriptions as of 05/26/2016  Medication Sig Note  . albuterol (PROVENTIL HFA;VENTOLIN HFA) 108 (90 Base) MCG/ACT inhaler Inhale 4-6 puffs into the lungs every 4 (four) hours as needed for wheezing or shortness of breath. Reported on 04/08/2016 05/26/2016: Take as needed.   Marland Kitchen allopurinol (ZYLOPRIM) 100 MG tablet THREE (3) TABLETS TWICE DAILY   . amLODipine (NORVASC) 10 MG tablet Take 10 mg by mouth daily.   Marland Kitchen apixaban (ELIQUIS) 5 MG TABS tablet Take 5 mg by mouth 2 (two)  times daily.   Marland Kitchen atorvastatin (LIPITOR) 80 MG tablet TAKE 1 TABLET BY MOUTH EVERY NIGHT AT BEDTIME.   . cetirizine (ZYRTEC) 10 MG tablet TAKE ONE TABLET AT BEDTIME   . clopidogrel (PLAVIX) 75 MG tablet Take 1 tablet (75 mg total) by mouth daily.   . cyclobenzaprine (FLEXERIL) 10 MG tablet Take 1 tablet (10 mg total) by mouth every 8 (eight) hours as needed for muscle spasms. 05/26/2016: As needed   . dicyclomine (BENTYL) 20 MG tablet Take 1 tablet (20 mg total) by mouth 3 (three) times daily as needed (abdominal pain).   . DULERA 200-5 MCG/ACT AERO Inhale 2 puffs into the lungs daily.   . fluticasone furoate-vilanterol (BREO ELLIPTA) 100-25 MCG/INH AEPB Inhale 1 puff into the lungs daily.    . furosemide (LASIX) 80 MG tablet Take 80 mg by mouth 2 (two) times daily. 05/26/2016: Per pt, lasix cut down to once a day, twice for swelling   . isosorbide mononitrate (IMDUR) 60 MG 24 hr tablet Take 120 mg by mouth daily. Reported on 04/24/2016   . lansoprazole (PREVACID) 30 MG capsule Take 30 mg by mouth daily.    . magnesium oxide (MAG-OX) 400 (241.3 Mg) MG tablet Take 400 mg by mouth daily.   . metFORMIN (GLUCOPHAGE) 500 MG tablet Take 500 mg by mouth daily with breakfast.    . metoprolol tartrate (LOPRESSOR) 25 MG tablet Take 50  mg by mouth 2 (two) times daily.    Marland Kitchen omega-3 acid ethyl esters (LOVAZA) 1 g capsule Take 4 g by mouth daily.   Marland Kitchen oxyCODONE-acetaminophen (PERCOCET) 10-325 MG tablet Take 1 tablet by mouth every 4 (four) hours as needed for pain.   . potassium chloride (K-DUR,KLOR-CON) 10 MEQ tablet Take 10 mEq by mouth daily.   Marland Kitchen RANEXA 500 MG 12 hr tablet TAKE 2 TABLETS BY MOUTH TWICE A DAY.   . sotalol (BETAPACE) 120 MG tablet Take 120 mg by mouth 2 (two) times daily.    . sucralfate (CARAFATE) 1 g tablet Take 1 tablet (1 g total) by mouth 2 (two) times daily.   . tamsulosin (FLOMAX) 0.4 MG CAPS capsule Take 0.4 mg by mouth daily after breakfast.    . tiotropium (SPIRIVA) 18 MCG inhalation  capsule Place 18 mcg into inhaler and inhale daily.   . [DISCONTINUED] ALPRAZolam (XANAX) 1 MG tablet Take 0.5-1 tablets (0.5-1 mg total) by mouth 3 (three) times daily as needed for anxiety.   . [DISCONTINUED] lisinopril (PRINIVIL,ZESTRIL) 5 MG tablet Take 1 tablet by mouth daily.   Marland Kitchen GAVILYTE-N WITH FLAVOR PACK 420 g solution Take 1 Dose by mouth once. Reported on 05/26/2016   . nitroGLYCERIN (NITROSTAT) 0.4 MG SL tablet Place 0.4 mg under the tongue every 5 (five) minutes as needed for chest pain. Reported on 05/26/2016 05/26/2016: Available if needed.   . ondansetron (ZOFRAN) 4 MG tablet Take 1 tablet (4 mg total) by mouth every 8 (eight) hours as needed for nausea or vomiting. (Patient not taking: Reported on 05/26/2016)    No facility-administered encounter medications on file as of 05/26/2016.    Functional Status:   In your present state of health, do you have any difficulty performing the following activities: 04/16/2016 04/03/2016  Hearing? N -  Vision? Y -  Difficulty concentrating or making decisions? N -  Walking or climbing stairs? Y -  Dressing or bathing? N -  Doing errands, shopping? Y N    Fall/Depression Screening:    PHQ 2/9 Scores 04/16/2016 09/06/2015  PHQ - 2 Score 0 0  PHQ- 9 Score - 2    Assessment:  Pleasant 60 year old man.  Visit done at  [facility] where pt/spouse/family are staying temporarily.                          HF- lungs clear, no edema.   Pt not weighing- scale in storage,plans to get out soon and start weighing.                          MD follow up appointments- per pt, cancelled Lung and Heart MD appointments- no transportation, RN CM                             Provided number for [facility] transportation.                                                           Plan:  Pt to call [place] transportation for transportation needs.           Pt to call Heart MD, schedule appointment.  Pt to get scale out of storage, start weighing, record,  as ordered take  extra Lasix if needed for edema.             Pt to f/u with Dr. [name] 8/4.             Plan to deliver for pt completed application to [employer].            Plan to update Dr. [name]- continue to follow pt with community nurse case management services.             As discussed with pt, plan to follow up again 8/10- home visit.              THN CM Care Plan Problem Two        Most Recent Value   Care Plan Problem Two  Knowledge deficit related to HF exacerbation related to symptom management    Role Documenting the Problem Two  Care Management Coordinator   Care Plan for Problem Two  Active   Interventions for Problem Two Long Term Goal   Provided pt with information on HF zones, reviewed.    THN Long Term Goal (31-90) days  Pt will verbalize the plan of action in the yellow zone within the next 40 days    THN Long Term Goal Start Date  05/26/16   THN CM Short Term Goal #1 (0-30 days)  Pt will schedule f/u appointment with Heart MD, see MD within the next 30 days    THN CM Short Term Goal #1 Start Date  05/26/16   Interventions for Short Term Goal #2   Discussed with pt importance of f/u with Heart MD - pt cancelled previous one - no transportation, RN CM provided [employer] transportation number (provide 6 rounds trips free a year).    THN CM Short Term Goal #2 (0-30 days)  Pt to get scale from storage, start weighing daily, record within the next 30 days    THN CM Short Term Goal #2 Start Date  05/26/16   Interventions for Short Term Goal #2  Discussed with pt importance of weighing daily.recording, call MD for weight gain      Lawrence [name].   [facility] Management  [phone]

## 2016-05-27 NOTE — Telephone Encounter (Signed)
received refill request for Sotalol, but this medications is prescribed by cardiology. Please advised patient he needs to contact his cardiologist for refill of this medication.

## 2016-05-28 NOTE — Telephone Encounter (Signed)
Tried calling patient. Left message to call back.

## 2016-05-28 NOTE — Telephone Encounter (Signed)
Please review. Thanks!  

## 2016-05-28 NOTE — Telephone Encounter (Signed)
Pt called and stated he told the pharmacy to send RX to cardiologist.  He is not sure why they sent there.  He wants to know if we can send to cardiologist or inform pharmacy of thist?

## 2016-05-29 IMAGING — RF DG BARIUM SWALLOW
2 series · 14 of 22 positions shown · non-contrast
Comparison: None.

CLINICAL DATA: Remote history of previous esophageal dilation ;
known hiatal hernia ; choking sensation with solids and liquids

EXAM:
ESOPHOGRAM / BARIUM SWALLOW / BARIUM TABLET STUDY
TECHNIQUE: Combined double contrast and single contrast examination performed
using effervescent crystals, thick barium liquid, and thin barium
liquid. The patient was observed with fluoroscopy swallowing a 13mm
barium sulphate tablet.
FLUOROSCOPY TIME:  1 min, 30 seconds

[Series 1: fluoro_barium singleshot_bw · 0.17mm/px · 11 of 18 slices shown]
[im 1/18]
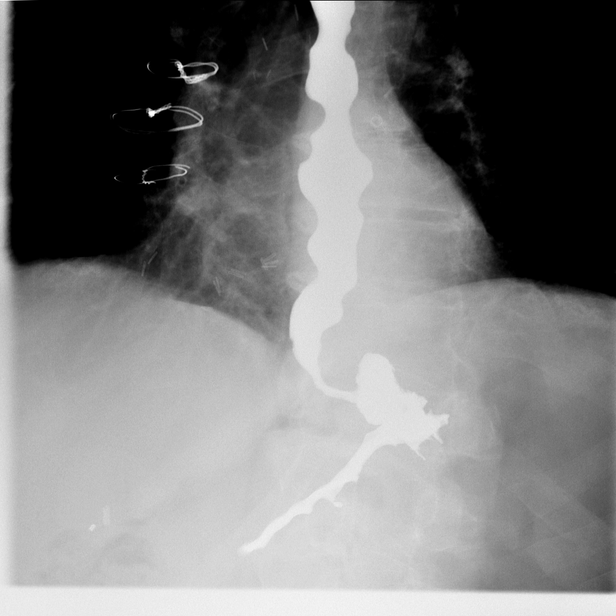
[im 3/18]
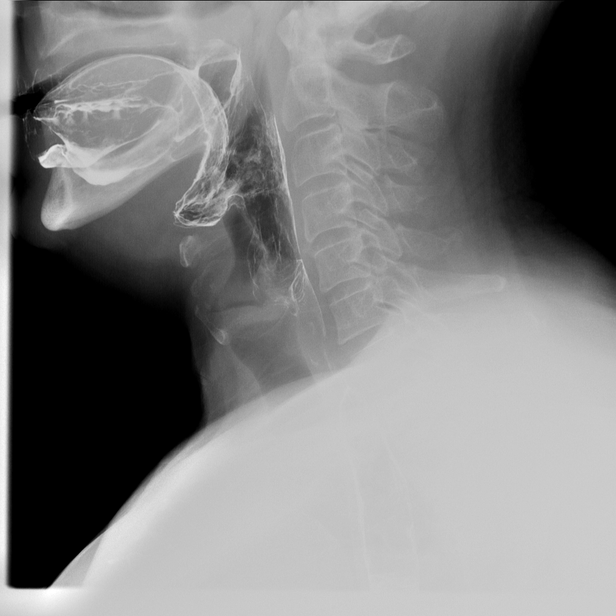
[im 4/18]
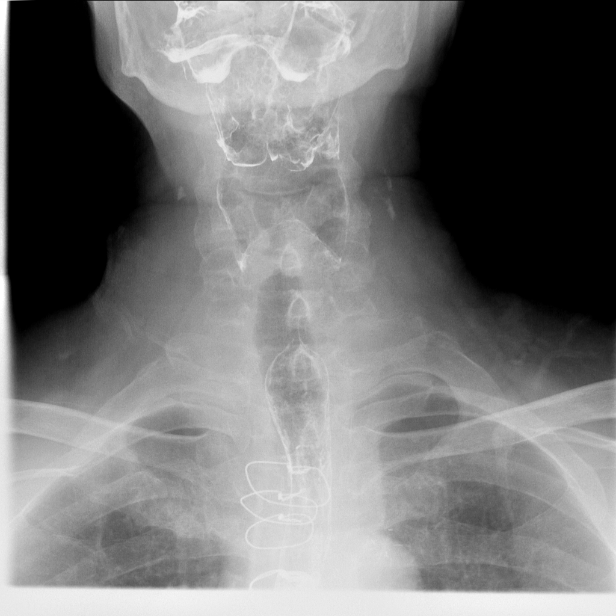
[im 6/18]
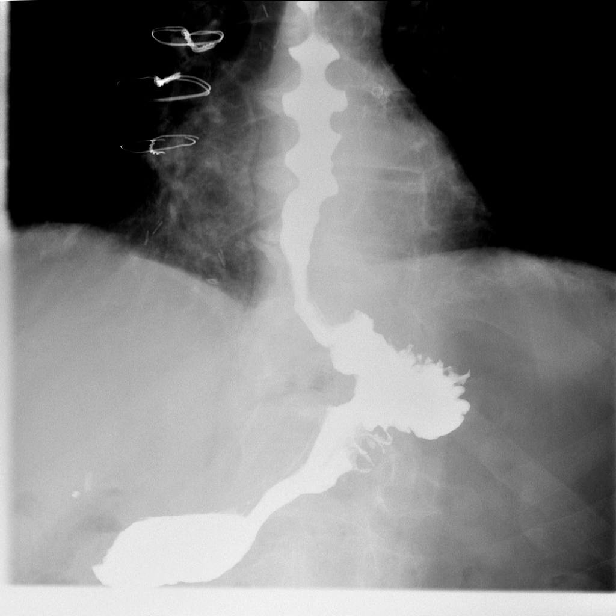
[im 8/18]
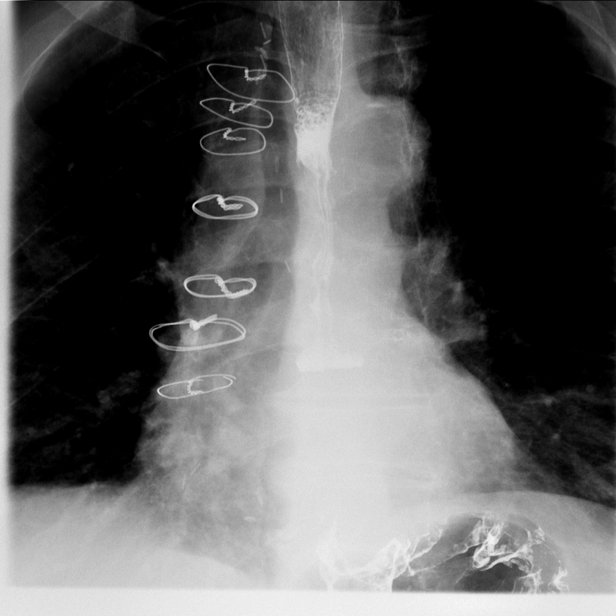
[im 9/18]
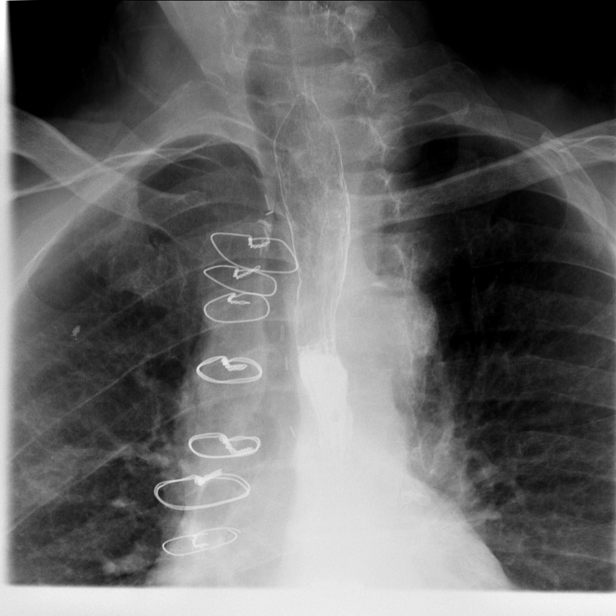
[im 11/18]
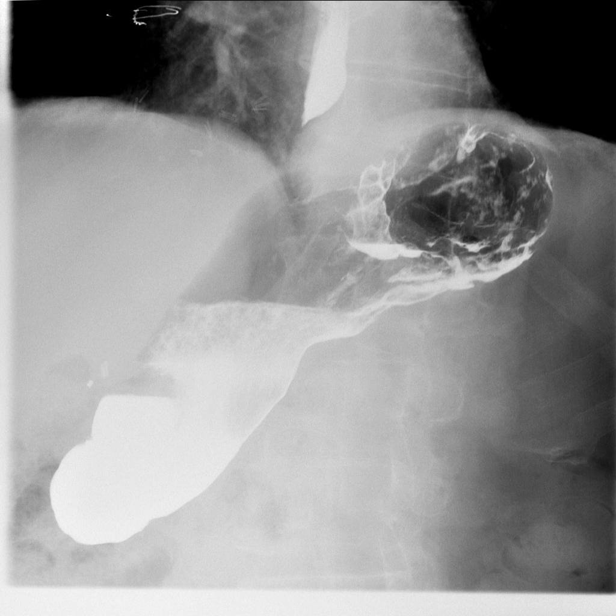
[im 12/18]
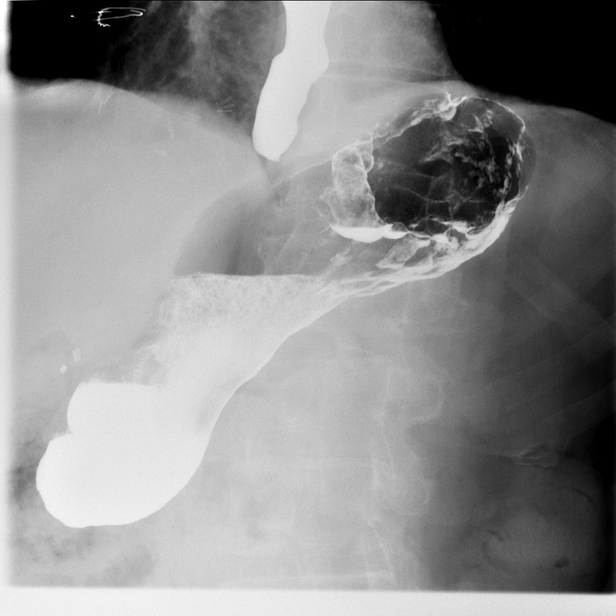
[im 14/18]
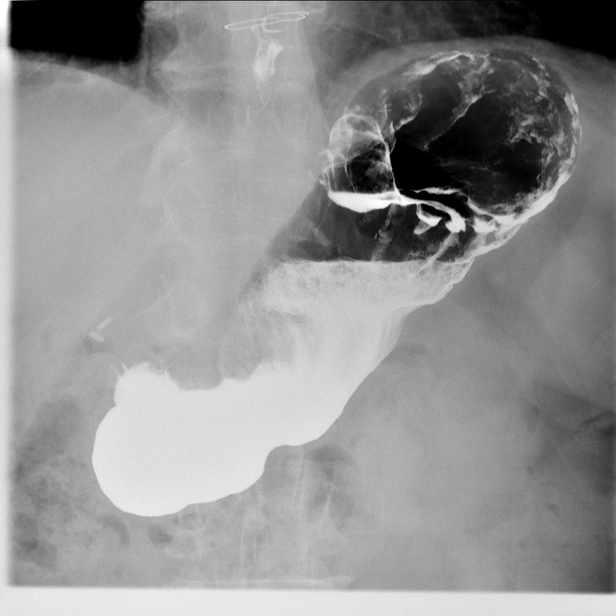
[im 15/18]
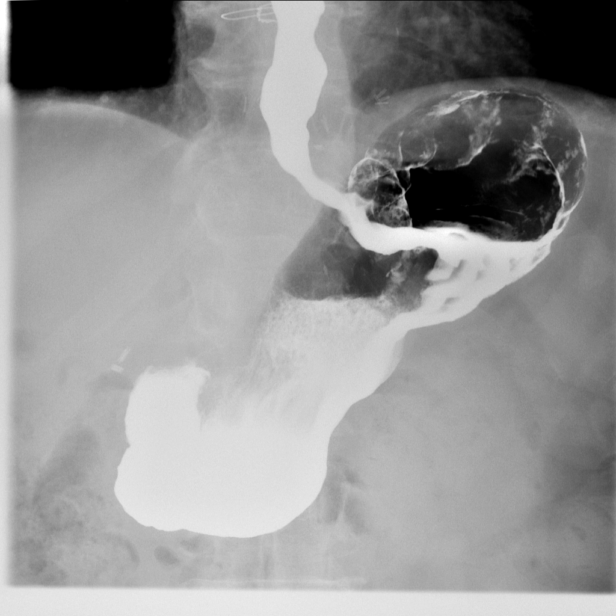
[im 17/18]
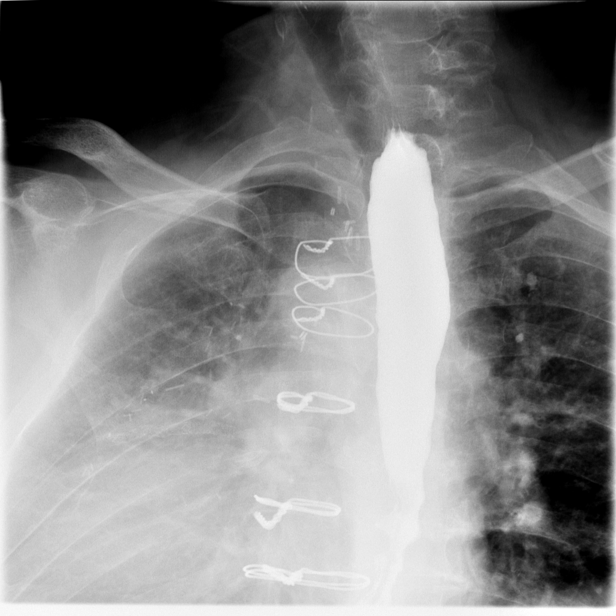

[Series 19: fluoro_barium 2fps_bw · 0.19mm/px · 3 of 8 frames shown]
[frame 2/8]
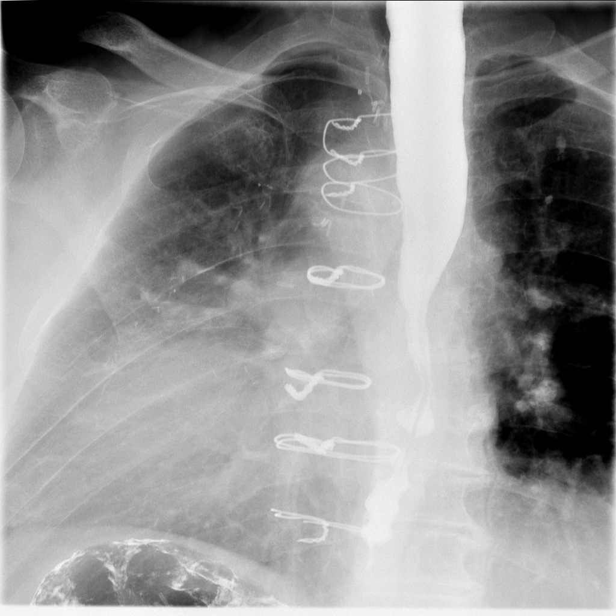
[frame 3/8]
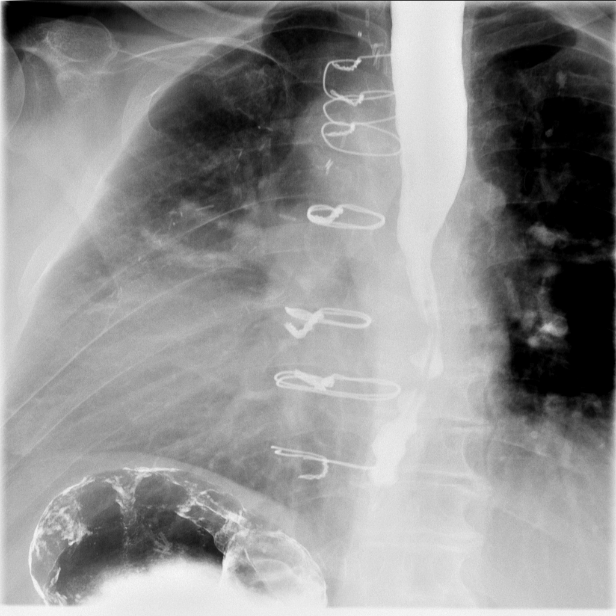
[frame 7/8]
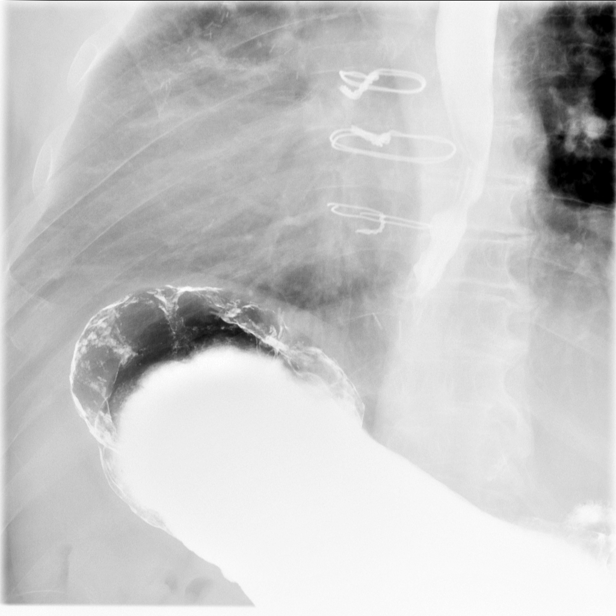

[14 of 22 positions shown; findings below may reference images not displayed]

FINDINGS: The cervical esophagus distended well. There was no laryngeal
penetration of the barium. The proximal and mid thoracic esophagus
distended well. Are prominent tertiary contractions from the mid
through the distal esophagus. No significant hiatal hernia was
demonstrated. The 12 mm barium pill passed without difficulty. There
is mild prominence of the medial mucosa of the gastric cardia.
Elsewhere the gastric mucosal folds in the cardia and in the
proximal fundus are prominent.

The patient has undergone previous median sternotomy.
IMPRESSION: 1. There are moderate changes of presbyesophagus. While there is
some delay in propagation of the primary and secondary peristaltic
waves through the distal esophagus no obstruction to passage of the
barium pill was demonstrated.
2. There is prominence of the mucosal folds in the gastric cardia
and proximal gastric body. Direct visualization is recommended.

## 2016-05-29 NOTE — Telephone Encounter (Signed)
The patient should call Dr. Alveria Apley office and request refill. We are not able to send refill request to his office.

## 2016-06-09 DIAGNOSIS — G4733 Obstructive sleep apnea (adult) (pediatric): Secondary | ICD-10-CM | POA: Diagnosis not present

## 2016-06-11 DIAGNOSIS — J449 Chronic obstructive pulmonary disease, unspecified: Secondary | ICD-10-CM | POA: Diagnosis not present

## 2016-06-11 DIAGNOSIS — J439 Emphysema, unspecified: Secondary | ICD-10-CM | POA: Diagnosis not present

## 2016-06-11 DIAGNOSIS — I10 Essential (primary) hypertension: Secondary | ICD-10-CM | POA: Diagnosis not present

## 2016-06-11 DIAGNOSIS — G4710 Hypersomnia, unspecified: Secondary | ICD-10-CM | POA: Diagnosis not present

## 2016-06-19 ENCOUNTER — Ambulatory Visit (INDEPENDENT_AMBULATORY_CARE_PROVIDER_SITE_OTHER): Payer: Commercial Managed Care - HMO | Admitting: Family Medicine

## 2016-06-19 ENCOUNTER — Encounter: Payer: Self-pay | Admitting: Family Medicine

## 2016-06-19 VITALS — BP 120/70 | HR 90 | Temp 97.7°F | Resp 18 | Wt 244.0 lb

## 2016-06-19 DIAGNOSIS — M5410 Radiculopathy, site unspecified: Secondary | ICD-10-CM

## 2016-06-19 DIAGNOSIS — J449 Chronic obstructive pulmonary disease, unspecified: Secondary | ICD-10-CM | POA: Diagnosis not present

## 2016-06-19 DIAGNOSIS — E1159 Type 2 diabetes mellitus with other circulatory complications: Secondary | ICD-10-CM | POA: Diagnosis not present

## 2016-06-19 LAB — POCT GLYCOSYLATED HEMOGLOBIN (HGB A1C)
Est. average glucose Bld gHb Est-mCnc: 143
Hemoglobin A1C: 6.6

## 2016-06-19 MED ORDER — OXYCODONE-ACETAMINOPHEN 10-325 MG PO TABS: 1.0000 | ORAL_TABLET | ORAL | 0 refills | Status: DC | PRN

## 2016-06-19 NOTE — Progress Notes (Signed)
Patient: Lawrence [name] Male    DOB: 1956/08/20   60 y.o.   MRN: [id number] Visit Date: 06/19/2016  Today's Provider: [name], MD   Chief Complaint  Patient presents with  . Follow-up  . Diabetes  . COPD   Subjective:    HPI   Chronic diastolic CHF (congestive heart failure): From 02/19/2016-Improved with change to furosemide. Apparently labs from cardiologist showed dehydration and is going to cut back to QD furosemide.   Chronic obstructive pulmonary disease, unspecified COPD: From 01/02/2016-continue maintenance inhalers. Continue follow up with pulmonologist as scheduled.    Diabetes Mellitus Type II, Follow-up:   Lab Results  Component Value Date   HGBA1C 6.3 (H) 04/03/2016   HGBA1C 6.4 (H) 03/26/2016   HGBA1C 6.7 (H) 09/12/2015   Last seen for diabetes 4 months ago.  Management since then includes; no changes. He reports good compliance with treatment. He is not having side effects. none Current symptoms include none and have been unchanged. Home blood sugar records: fasting range: 110/120  Episodes of hypoglycemia? no   Current Insulin Regimen: n/a Most Recent Eye Exam: due Weight trend: stable Prior visit with dietician: no Current diet: well balanced Current exercise: none  ----------------------------------------------------------------  Patient does have fluid retention in legs occasionally.   Allergies  Allergen Reactions  . Demerol [Meperidine] Hives  . Prednisone Other (See Comments) and Hypertension    Pt states that this medication puts him in A-fib   . Sulfa Antibiotics Hives  . Albuterol Sulfate [Albuterol] Palpitations and Other (See Comments)    Pt currently uses this medication.    . Morphine Sulfate Nausea And Vomiting, Rash and Other (See Comments)    Pt states that he is only allergic to the tablet form of this medication.     Current Meds  Medication Sig  . albuterol (PROVENTIL HFA;VENTOLIN HFA) 108 (90 Base) MCG/ACT  inhaler Inhale 4-6 puffs into the lungs every 4 (four) hours as needed for wheezing or shortness of breath. Reported on 04/08/2016  . allopurinol (ZYLOPRIM) 100 MG tablet THREE (3) TABLETS TWICE DAILY  . ALPRAZolam (XANAX) 1 MG tablet Take 0.5-1 tablets (0.5-1 mg total) by mouth 3 (three) times daily as needed for anxiety.  Marland Kitchen amLODipine (NORVASC) 10 MG tablet Take 10 mg by mouth daily.  Marland Kitchen apixaban (ELIQUIS) 5 MG TABS tablet Take 5 mg by mouth 2 (two) times daily.  Marland Kitchen atorvastatin (LIPITOR) 80 MG tablet TAKE 1 TABLET BY MOUTH EVERY NIGHT AT BEDTIME.  . cetirizine (ZYRTEC) 10 MG tablet TAKE ONE TABLET AT BEDTIME  . clopidogrel (PLAVIX) 75 MG tablet Take 1 tablet (75 mg total) by mouth daily.  . cyclobenzaprine (FLEXERIL) 10 MG tablet Take 1 tablet (10 mg total) by mouth every 8 (eight) hours as needed for muscle spasms.  Marland Kitchen dicyclomine (BENTYL) 20 MG tablet Take 1 tablet (20 mg total) by mouth 3 (three) times daily as needed (abdominal pain).  . DULERA 200-5 MCG/ACT AERO Inhale 2 puffs into the lungs daily.  . fluticasone furoate-vilanterol (BREO ELLIPTA) 100-25 MCG/INH AEPB Inhale 1 puff into the lungs daily.   . furosemide (LASIX) 80 MG tablet Take 80 mg by mouth 2 (two) times daily.  Marland Kitchen GAVILYTE-N WITH FLAVOR PACK 420 g solution Take 1 Dose by mouth once. Reported on 05/26/2016  . isosorbide mononitrate (IMDUR) 60 MG 24 hr tablet Take 120 mg by mouth daily. Reported on 04/24/2016  . lansoprazole (PREVACID) 30 MG capsule Take 30  mg by mouth daily.   Marland Kitchen lisinopril (PRINIVIL,ZESTRIL) 5 MG tablet TAKE ONE (1) TABLET EACH DAY  . magnesium oxide (MAG-OX) 400 (241.3 Mg) MG tablet Take 400 mg by mouth daily.  . metFORMIN (GLUCOPHAGE) 500 MG tablet Take 500 mg by mouth daily with breakfast.   . metoprolol tartrate (LOPRESSOR) 25 MG tablet Take 50 mg by mouth 2 (two) times daily.   . nitroGLYCERIN (NITROSTAT) 0.4 MG SL tablet Place 0.4 mg under the tongue every 5 (five) minutes as needed for chest pain. Reported  on 05/26/2016  . omega-3 acid ethyl esters (LOVAZA) 1 g capsule Take 4 g by mouth daily.  . ondansetron (ZOFRAN) 4 MG tablet Take 1 tablet (4 mg total) by mouth every 8 (eight) hours as needed for nausea or vomiting.  Marland Kitchen oxyCODONE-acetaminophen (PERCOCET) 10-325 MG tablet Take 1 tablet by mouth every 4 (four) hours as needed for pain.  . potassium chloride (K-DUR,KLOR-CON) 10 MEQ tablet Take 10 mEq by mouth daily.  Marland Kitchen RANEXA 500 MG 12 hr tablet TAKE 2 TABLETS BY MOUTH TWICE A DAY.  . sotalol (BETAPACE) 120 MG tablet Take 120 mg by mouth 2 (two) times daily.   . sucralfate (CARAFATE) 1 g tablet Take 1 tablet (1 g total) by mouth 2 (two) times daily.  . tamsulosin (FLOMAX) 0.4 MG CAPS capsule Take 0.4 mg by mouth daily after breakfast.   . tiotropium (SPIRIVA) 18 MCG inhalation capsule Place 18 mcg into inhaler and inhale daily.    Review of Systems  Constitutional: Negative for appetite change, chills and fever.  Respiratory: Negative for chest tightness, shortness of breath and wheezing.   Cardiovascular: Positive for leg swelling. Negative for chest pain and palpitations.  Gastrointestinal: Negative for abdominal pain, nausea and vomiting.    Social History  Substance Use Topics  . Smoking status: Former Smoker    Quit date: 04/22/2013  . Smokeless tobacco: Never Used  . Alcohol use No     Comment: remotely quit alcohol use. Hx of heavy alcohol use.   Objective:   BP 120/70 (BP Location: Left Arm, Patient Position: Sitting, Cuff Size: Large)   Pulse 90   Temp 97.7 F (36.5 C) (Oral)   Resp 18   Wt 244 lb (110.7 kg)   SpO2 97%   BMI 38.22 kg/m   Physical Exam   General Appearance:    Alert, cooperative, no distress  Eyes:    PERRL, conjunctiva/corneas clear, EOM's intact       Lungs:     Faint breath sounds, no wheezes or rales respirations unlabored  Heart:    Regular rate and rhythm, 1+ bipedal edema  Neurologic:   Awake, alert, oriented x 3. No apparent focal neurological            defect.        Results for orders placed or performed in visit on 06/19/16  POCT glycosylated hemoglobin (Hb A1C)  Result Value Ref Range   Hemoglobin A1C 6.6    Est. average glucose Bld gHb Est-mCnc 143        Assessment & Plan:     1. 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  Continue current medications.  Follow up for a1c in 4 months.  - POCT glycosylated hemoglobin (Hb A1C)  2. Back pain with left-sided radiculopathy Paid reasonably controlled on current pain medications.  - oxyCODONE-acetaminophen (PERCOCET) 10-325 MG tablet; Take 1 tablet by mouth every 4 (four) hours as needed  for pain.  Dispense: 150 tablet; Refill: 0  3. Chronic obstructive pulmonary disease, unspecified COPD type ([place]) Stable, continue current inhalers and continuous O2     The entirety of the information documented in the History of Present Illness, Review of Systems and Physical Exam were personally obtained by me. Portions of this information were initially documented by April M. Sabra Heck, CMA and reviewed by me for thoroughness and accuracy.    [name], MD  [facility]

## 2016-06-24 ENCOUNTER — Encounter: Payer: Self-pay | Admitting: Emergency Medicine

## 2016-06-24 ENCOUNTER — Observation Stay
Admission: EM | Admit: 2016-06-24 | Discharge: 2016-06-26 | Disposition: A | Discharge status: Home / Self Care | Payer: Commercial Managed Care - HMO | Attending: Internal Medicine | Admitting: Internal Medicine

## 2016-06-24 ENCOUNTER — Emergency Department: Payer: Commercial Managed Care - HMO

## 2016-06-24 DIAGNOSIS — I482 Chronic atrial fibrillation: Secondary | ICD-10-CM | POA: Diagnosis not present

## 2016-06-24 DIAGNOSIS — N189 Chronic kidney disease, unspecified: Secondary | ICD-10-CM | POA: Diagnosis not present

## 2016-06-24 DIAGNOSIS — I739 Peripheral vascular disease, unspecified: Secondary | ICD-10-CM | POA: Insufficient documentation

## 2016-06-24 DIAGNOSIS — R1032 Left lower quadrant pain: Secondary | ICD-10-CM | POA: Diagnosis not present

## 2016-06-24 DIAGNOSIS — I130 Hypertensive heart and chronic kidney disease with heart failure and stage 1 through stage 4 chronic kidney disease, or unspecified chronic kidney disease: Secondary | ICD-10-CM | POA: Diagnosis not present

## 2016-06-24 DIAGNOSIS — F419 Anxiety disorder, unspecified: Secondary | ICD-10-CM | POA: Insufficient documentation

## 2016-06-24 DIAGNOSIS — J449 Chronic obstructive pulmonary disease, unspecified: Secondary | ICD-10-CM | POA: Diagnosis present

## 2016-06-24 DIAGNOSIS — I499 Cardiac arrhythmia, unspecified: Secondary | ICD-10-CM | POA: Diagnosis not present

## 2016-06-24 DIAGNOSIS — Z9049 Acquired absence of other specified parts of digestive tract: Secondary | ICD-10-CM | POA: Insufficient documentation

## 2016-06-24 DIAGNOSIS — K4090 Unilateral inguinal hernia, without obstruction or gangrene, not specified as recurrent: Secondary | ICD-10-CM | POA: Diagnosis not present

## 2016-06-24 DIAGNOSIS — R1011 Right upper quadrant pain: Secondary | ICD-10-CM | POA: Diagnosis not present

## 2016-06-24 DIAGNOSIS — E782 Mixed hyperlipidemia: Secondary | ICD-10-CM | POA: Diagnosis present

## 2016-06-24 DIAGNOSIS — Z818 Family history of other mental and behavioral disorders: Secondary | ICD-10-CM | POA: Insufficient documentation

## 2016-06-24 DIAGNOSIS — K219 Gastro-esophageal reflux disease without esophagitis: Secondary | ICD-10-CM | POA: Diagnosis present

## 2016-06-24 DIAGNOSIS — K5730 Diverticulosis of large intestine without perforation or abscess without bleeding: Secondary | ICD-10-CM | POA: Insufficient documentation

## 2016-06-24 DIAGNOSIS — J441 Chronic obstructive pulmonary disease with (acute) exacerbation: Secondary | ICD-10-CM | POA: Diagnosis not present

## 2016-06-24 DIAGNOSIS — I5032 Chronic diastolic (congestive) heart failure: Secondary | ICD-10-CM | POA: Diagnosis not present

## 2016-06-24 DIAGNOSIS — I5023 Acute on chronic systolic (congestive) heart failure: Secondary | ICD-10-CM | POA: Diagnosis present

## 2016-06-24 DIAGNOSIS — F418 Other specified anxiety disorders: Secondary | ICD-10-CM | POA: Diagnosis present

## 2016-06-24 DIAGNOSIS — R079 Chest pain, unspecified: Secondary | ICD-10-CM

## 2016-06-24 DIAGNOSIS — D649 Anemia, unspecified: Secondary | ICD-10-CM | POA: Insufficient documentation

## 2016-06-24 DIAGNOSIS — K900 Celiac disease: Secondary | ICD-10-CM | POA: Insufficient documentation

## 2016-06-24 DIAGNOSIS — I10 Essential (primary) hypertension: Secondary | ICD-10-CM | POA: Diagnosis not present

## 2016-06-24 DIAGNOSIS — I25110 Atherosclerotic heart disease of native coronary artery with unstable angina pectoris: Secondary | ICD-10-CM | POA: Insufficient documentation

## 2016-06-24 DIAGNOSIS — R0902 Hypoxemia: Secondary | ICD-10-CM | POA: Insufficient documentation

## 2016-06-24 DIAGNOSIS — I2510 Atherosclerotic heart disease of native coronary artery without angina pectoris: Secondary | ICD-10-CM | POA: Diagnosis present

## 2016-06-24 DIAGNOSIS — Z7984 Long term (current) use of oral hypoglycemic drugs: Secondary | ICD-10-CM | POA: Insufficient documentation

## 2016-06-24 DIAGNOSIS — I5022 Chronic systolic (congestive) heart failure: Secondary | ICD-10-CM | POA: Diagnosis present

## 2016-06-24 DIAGNOSIS — Z951 Presence of aortocoronary bypass graft: Secondary | ICD-10-CM | POA: Insufficient documentation

## 2016-06-24 DIAGNOSIS — E1121 Type 2 diabetes mellitus with diabetic nephropathy: Secondary | ICD-10-CM

## 2016-06-24 DIAGNOSIS — G4733 Obstructive sleep apnea (adult) (pediatric): Secondary | ICD-10-CM | POA: Insufficient documentation

## 2016-06-24 DIAGNOSIS — Z955 Presence of coronary angioplasty implant and graft: Secondary | ICD-10-CM | POA: Diagnosis not present

## 2016-06-24 DIAGNOSIS — K220 Achalasia of cardia: Secondary | ICD-10-CM | POA: Insufficient documentation

## 2016-06-24 DIAGNOSIS — E119 Type 2 diabetes mellitus without complications: Secondary | ICD-10-CM | POA: Diagnosis not present

## 2016-06-24 DIAGNOSIS — Z885 Allergy status to narcotic agent status: Secondary | ICD-10-CM | POA: Insufficient documentation

## 2016-06-24 DIAGNOSIS — Z87891 Personal history of nicotine dependence: Secondary | ICD-10-CM | POA: Diagnosis not present

## 2016-06-24 DIAGNOSIS — Z79899 Other long term (current) drug therapy: Secondary | ICD-10-CM | POA: Insufficient documentation

## 2016-06-24 DIAGNOSIS — R072 Precordial pain: Secondary | ICD-10-CM | POA: Diagnosis not present

## 2016-06-24 DIAGNOSIS — E1122 Type 2 diabetes mellitus with diabetic chronic kidney disease: Secondary | ICD-10-CM | POA: Diagnosis not present

## 2016-06-24 DIAGNOSIS — Z7901 Long term (current) use of anticoagulants: Secondary | ICD-10-CM | POA: Insufficient documentation

## 2016-06-24 DIAGNOSIS — E785 Hyperlipidemia, unspecified: Secondary | ICD-10-CM | POA: Diagnosis present

## 2016-06-24 DIAGNOSIS — R109 Unspecified abdominal pain: Secondary | ICD-10-CM | POA: Diagnosis not present

## 2016-06-24 DIAGNOSIS — I209 Angina pectoris, unspecified: Secondary | ICD-10-CM | POA: Diagnosis present

## 2016-06-24 DIAGNOSIS — Z888 Allergy status to other drugs, medicaments and biological substances status: Secondary | ICD-10-CM | POA: Insufficient documentation

## 2016-06-24 DIAGNOSIS — Z820 Family history of epilepsy and other diseases of the nervous system: Secondary | ICD-10-CM | POA: Insufficient documentation

## 2016-06-24 DIAGNOSIS — I471 Supraventricular tachycardia: Secondary | ICD-10-CM | POA: Diagnosis not present

## 2016-06-24 DIAGNOSIS — Z8249 Family history of ischemic heart disease and other diseases of the circulatory system: Secondary | ICD-10-CM | POA: Insufficient documentation

## 2016-06-24 DIAGNOSIS — I25118 Atherosclerotic heart disease of native coronary artery with other forms of angina pectoris: Secondary | ICD-10-CM

## 2016-06-24 DIAGNOSIS — I480 Paroxysmal atrial fibrillation: Secondary | ICD-10-CM | POA: Insufficient documentation

## 2016-06-24 DIAGNOSIS — Z825 Family history of asthma and other chronic lower respiratory diseases: Secondary | ICD-10-CM | POA: Insufficient documentation

## 2016-06-24 DIAGNOSIS — Z833 Family history of diabetes mellitus: Secondary | ICD-10-CM | POA: Insufficient documentation

## 2016-06-24 DIAGNOSIS — N400 Enlarged prostate without lower urinary tract symptoms: Secondary | ICD-10-CM | POA: Insufficient documentation

## 2016-06-24 DIAGNOSIS — Z882 Allergy status to sulfonamides status: Secondary | ICD-10-CM | POA: Insufficient documentation

## 2016-06-24 DIAGNOSIS — Z7902 Long term (current) use of antithrombotics/antiplatelets: Secondary | ICD-10-CM | POA: Insufficient documentation

## 2016-06-24 LAB — COMPREHENSIVE METABOLIC PANEL
ALT: 16 U/L — ABNORMAL LOW (ref 17–63)
AST: 19 U/L (ref 15–41)
Albumin: 3.7 g/dL (ref 3.5–5.0)
Alkaline Phosphatase: 82 U/L (ref 38–126)
Anion gap: 4 — ABNORMAL LOW (ref 5–15)
BUN: 12 mg/dL (ref 6–20)
CO2: 29 mmol/L (ref 22–32)
Calcium: 9 mg/dL (ref 8.9–10.3)
Chloride: 107 mmol/L (ref 101–111)
Creatinine, Ser: 0.89 mg/dL (ref 0.61–1.24)
GFR calc Af Amer: >60 mL/min (ref >60)
GFR calc non Af Amer: >60 mL/min (ref >60)
Glucose, Bld: 127 mg/dL — ABNORMAL HIGH (ref 65–99)
Potassium: 3.9 mmol/L (ref 3.5–5.1)
Sodium: 140 mmol/L (ref 135–145)
Total Bilirubin: 0.6 mg/dL (ref 0.3–1.2)
Total Protein: 6.9 g/dL (ref 6.5–8.1)

## 2016-06-24 LAB — LIPASE, BLOOD: Lipase: 45 U/L (ref 11–51)

## 2016-06-24 LAB — CBC
HCT: 39 % — ABNORMAL LOW (ref 40.0–52.0)
Hemoglobin: 12.2 g/dL — ABNORMAL LOW (ref 13.0–18.0)
MCH: 24.3 pg — ABNORMAL LOW (ref 26.0–34.0)
MCHC: 31.4 g/dL — ABNORMAL LOW (ref 32.0–36.0)
MCV: 77.3 fL — ABNORMAL LOW (ref 80.0–100.0)
Platelets: 233 10*3/uL (ref 150–440)
RBC: 5.04 MIL/uL (ref 4.40–5.90)
RDW: 18.6 % — ABNORMAL HIGH (ref 11.5–14.5)
WBC: 6 10*3/uL (ref 3.8–10.6)

## 2016-06-24 LAB — TROPONIN I: Troponin I: <0.03 ng/mL (ref <0.03)

## 2016-06-24 LAB — BRAIN NATRIURETIC PEPTIDE: B Natriuretic Peptide: 87 pg/mL (ref 0.0–100.0)

## 2016-06-24 MED ORDER — GI COCKTAIL ~~LOC~~
30.0000 mL | ORAL | Status: AC
  Administered 2016-06-24: 30 mL via ORAL
  Filled 2016-06-24: qty 30

## 2016-06-24 MED ORDER — IOPAMIDOL (ISOVUE-300) INJECTION 61%
100.0000 mL | Freq: Once | INTRAVENOUS | Status: AC | PRN
Start: 2016-06-24 — End: 2016-06-24
  Administered 2016-06-24: 100 mL via INTRAVENOUS

## 2016-06-24 MED ORDER — LEVALBUTEROL HCL 1.25 MG/0.5ML IN NEBU
1.2500 mg | INHALATION_SOLUTION | Freq: Once | RESPIRATORY_TRACT | Status: AC
  Administered 2016-06-24: 1.25 mg via RESPIRATORY_TRACT
  Filled 2016-06-24: qty 0.5

## 2016-06-24 MED ORDER — ONDANSETRON HCL 4 MG/2ML IJ SOLN
4.0000 mg | Freq: Once | INTRAMUSCULAR | Status: AC
  Administered 2016-06-24: 4 mg via INTRAVENOUS
  Filled 2016-06-24: qty 2

## 2016-06-24 MED ORDER — FAMOTIDINE IN NACL 20-0.9 MG/50ML-% IV SOLN
20.0000 mg | Freq: Once | INTRAVENOUS | Status: AC
  Administered 2016-06-24: 20 mg via INTRAVENOUS
  Filled 2016-06-24: qty 50

## 2016-06-24 MED ORDER — DIATRIZOATE MEGLUMINE & SODIUM 66-10 % PO SOLN
15.0000 mL | Freq: Once | ORAL | Status: AC
  Administered 2016-06-24: 15 mL via ORAL

## 2016-06-24 MED ORDER — HYDROMORPHONE HCL 1 MG/ML IJ SOLN
0.5000 mg | INTRAMUSCULAR | Status: DC | PRN
Start: 2016-06-24 — End: 2016-06-26
  Administered 2016-06-24 – 2016-06-26 (×10): 0.5 mg via INTRAVENOUS
  Filled 2016-06-24 (×10): qty 1

## 2016-06-24 NOTE — ED Triage Notes (Signed)
Pt presents to Ed via [facility] with c/o left sided pressure chest pain and URQ/ULQ pain, associated with nausea/vomiting for last 2 days. Pt taken 57mx4 ASA and given 712m fentanyl, sublingual nitro x1, 59m62mofran prior to arrival. Pt on 2L [place] O2 at home. EMS reports [id number].

## 2016-06-24 NOTE — ED Notes (Signed)
Gave pt urinal

## 2016-06-24 NOTE — H&P (Signed)
Lawrence [name] at [place] NAME: Lawrence [name]    MR#:  [id number]  DATE OF BIRTH:  07/28/1956  DATE OF ADMISSION:  06/24/2016  PRIMARY CARE PHYSICIAN: [name], MD   REQUESTING/REFERRING PHYSICIAN: Joni Fears, MD  CHIEF COMPLAINT:   Chief Complaint  Patient presents with  . Chest Pain  . Abdominal Pain    HISTORY OF PRESENT ILLNESS:  Lawrence [name]  is a 60 y.o. male who presents with Right upper quadrant/left lower chest pain. Patient has a history of recurrent chest pain. He does have a history of CAD with prior treated disease including stents. He comes in with this pain today which she says been going on for about 3 days. Nothing at home seemed to help it. He states that he also has COPD, but his inhalers have not been helping either. He does mention that couple of days ago he vomited up a small amount of emesis that included some solid black pieces which she feels like may have been clotted blood. This only happened one time. Pain is difficult to control in the ED here tonight. Labs are largely unremarkable initially. Hospitals were called for admission for further evaluation.  PAST MEDICAL HISTORY:   Past Medical History:  Diagnosis Date  . A-fib ([place])   . Anemia   . Anxiety   . Asthma   . CAD (coronary artery disease)    a. 2002 CABGx2 (LIMA->LAD, VG->VG->OM1);  b. 09/2012 DES->OM;  c. 03/2015 PTCA of LAD [facility]) in setting of atretic LIMA; d. 05/2015 Cath [facility]): nonobs dzs; e. 06/2015 Cath ([facility]): LM nl, LAD 45p/d ISR, 50d, D1/2 small, LCX 50p/d ISR, OM1 70ost, 30 ISR, VG->OM1 50ost, 27m LIMA->LAD 99p/d - atretic, RCA dom, nl; f.cath 10/16: 40-50%(FFR 0.90) pLAD, 75% (FFR 0.77) mLAD s/p PCI/DES, oRCA 40% (FFR0.95)  . Celiac disease   . Chronic diastolic CHF (congestive heart failure) ([place]    a. 06/2009 Echo: EF 60-65%, Gr 1 DD, triv AI, mildly dil LA, nl RV.  .Marland KitchenCOPD (chronic obstructive pulmonary disease) ([place]    a. Chronic  bronchitis and emphysema.  .Marland KitchenDysrhythmia   . Essential hypertension   . History of tobacco abuse    a. Quit 2014.  .Marland KitchenPSVT (paroxysmal supraventricular tachycardia) ([place]    a. 10/2012 Noted on Zio Patch.  . Type II diabetes mellitus ([place]     PAST SURGICAL HISTORY:   Past Surgical History:  Procedure Laterality Date  . BYPASS GRAFT    . CARDIAC CATHETERIZATION N/A 07/12/2015   rocedure: Left Heart Cath and Cors/Grafts Angiography;  Surgeon: HBelva Crome MD;  [place] [place];  Service: Cardiovascular;  Laterality: N/A;  . CARDIAC CATHETERIZATION Right 10/07/2015   Procedure: Left Heart Cath and Cors/Grafts Angiography;  Surgeon: SDionisio  MD;  [place];  Service: Cardiovascular;  Laterality: Right;  . CARDIAC CATHETERIZATION N/A 04/06/2016   Procedure: Left Heart Cath and Coronary Angiography;  Surgeon: DYolonda Kida MD;  [place] [place];  Service: Cardiovascular;  Laterality: N/A;  . CARDIAC CATHETERIZATION  04/06/2016   Procedure: Bypass Graft Angiography;  Surgeon: DYolonda Kida MD;  [place] [place];  Service: Cardiovascular;;  . CHOLECYSTECTOMY    . ESOPHAGEAL DILATION    . TONSILLECTOMY    . VASCULAR SURGERY      SOCIAL HISTORY:   Social History  Substance Use Topics  . Smoking status: Former Smoker  Quit date: 04/22/2013  . Smokeless tobacco: Never Used  . Alcohol use No     Comment: remotely quit alcohol use. Hx of heavy alcohol use.    FAMILY HISTORY:   Family History  Problem Relation Age of Onset  . Heart attack Mother   . Depression Mother   . Heart disease Mother   . COPD Mother   . Hypertension Mother   . Heart attack Father   . Diabetes Father   . Depression Father   . Heart disease Father   . Cirrhosis Father   . Parkinson's disease Brother     DRUG ALLERGIES:   Allergies  Allergen Reactions  . Demerol [Meperidine] Hives  . Prednisone Other (See Comments) and  Hypertension    Pt states that this medication puts him in A-fib   . Sulfa Antibiotics Hives  . Albuterol Sulfate [Albuterol] Palpitations and Other (See Comments)    Pt currently uses this medication.    . Morphine Sulfate Nausea And Vomiting, Rash and Other (See Comments)    Pt states that he is only allergic to the tablet form of this medication.      MEDICATIONS AT HOME:   Prior to Admission medications   Medication Sig Start Date End Date Taking? Authorizing Provider  albuterol (PROVENTIL HFA;VENTOLIN HFA) 108 (90 Base) MCG/ACT inhaler Inhale 4-6 puffs into the lungs every 4 (four) hours as needed for wheezing or shortness of breath. Reported on 04/08/2016   Yes Historical Provider, MD  allopurinol (ZYLOPRIM) 100 MG tablet THREE (3) TABLETS TWICE DAILY 04/25/16  Yes Birdie Sons, MD  ALPRAZolam Duanne Moron) 1 MG tablet Take 0.5-1 tablets (0.5-1 mg total) by mouth 3 (three) times daily as needed for anxiety. 05/26/16  Yes Birdie Sons, MD  amLODipine (NORVASC) 10 MG tablet Take 10 mg by mouth daily.   Yes Historical Provider, MD  apixaban (ELIQUIS) 5 MG TABS tablet Take 5 mg by mouth 2 (two) times daily.   Yes Historical Provider, MD  atorvastatin (LIPITOR) 80 MG tablet TAKE 1 TABLET BY MOUTH EVERY NIGHT AT BEDTIME. 05/26/16  Yes Birdie Sons, MD  cetirizine (ZYRTEC) 10 MG tablet TAKE ONE TABLET AT BEDTIME 04/19/16  Yes Birdie Sons, MD  clopidogrel (PLAVIX) 75 MG tablet Take 1 tablet (75 mg total) by mouth daily. 10/08/15  Yes Nicholes Mango, MD  cyclobenzaprine (FLEXERIL) 10 MG tablet Take 1 tablet (10 mg total) by mouth every 8 (eight) hours as needed for muscle spasms. 01/08/16  Yes Birdie Sons, MD  fluticasone furoate-vilanterol (BREO ELLIPTA) 100-25 MCG/INH AEPB Inhale 1 puff into the lungs daily.    Yes Historical Provider, MD  furosemide (LASIX) 80 MG tablet Take 80 mg by mouth 2 (two) times daily.   Yes Historical Provider, MD  isosorbide mononitrate (IMDUR) 60 MG 24 hr tablet  Take 120 mg by mouth daily. Reported on 04/24/2016   Yes Historical Provider, MD  lansoprazole (PREVACID) 30 MG capsule Take 30 mg by mouth daily.    Yes Historical Provider, MD  lisinopril (PRINIVIL,ZESTRIL) 5 MG tablet Take 5 mg by mouth daily.   Yes Historical Provider, MD  magnesium oxide (MAG-OX) 400 (241.3 Mg) MG tablet Take 400 mg by mouth daily.   Yes Historical Provider, MD  metFORMIN (GLUCOPHAGE) 500 MG tablet Take 500 mg by mouth daily with breakfast.    Yes Historical Provider, MD  metoprolol tartrate (LOPRESSOR) 25 MG tablet Take 50 mg by mouth 2 (two) times daily.  Yes Historical Provider, MD  nitroGLYCERIN (NITROSTAT) 0.4 MG SL tablet Place 0.4 mg under the tongue every 5 (five) minutes as needed for chest pain. Reported on 05/26/2016   Yes Historical Provider, MD  omega-3 acid ethyl esters (LOVAZA) 1 g capsule Take 4 g by mouth daily.   Yes Historical Provider, MD  oxyCODONE-acetaminophen (PERCOCET) 10-325 MG tablet Take 1 tablet by mouth every 4 (four) hours as needed for pain. 06/19/16  Yes Birdie Sons, MD  RANEXA 500 MG 12 hr tablet TAKE 2 TABLETS BY MOUTH TWICE A DAY. 04/09/16  Yes Birdie Sons, MD  sotalol (BETAPACE) 120 MG tablet Take 120 mg by mouth 2 (two) times daily.    Yes Historical Provider, MD  tamsulosin (FLOMAX) 0.4 MG CAPS capsule Take 0.4 mg by mouth daily after breakfast.    Yes Historical Provider, MD  tiotropium (SPIRIVA) 18 MCG inhalation capsule Place 18 mcg into inhaler and inhale daily.   Yes Historical Provider, MD  dicyclomine (BENTYL) 20 MG tablet Take 1 tablet (20 mg total) by mouth 3 (three) times daily as needed (abdominal pain). Patient not taking: Reported on 06/24/2016 04/24/16   Nance Pear, MD  ondansetron (ZOFRAN) 4 MG tablet Take 1 tablet (4 mg total) by mouth every 8 (eight) hours as needed for nausea or vomiting. Patient not taking: Reported on 06/24/2016 04/24/16   Nance Pear, MD  sucralfate (CARAFATE) 1 g tablet Take 1 tablet (1 g total)  by mouth 2 (two) times daily. Patient not taking: Reported on 06/24/2016 04/22/16   [name], MD    REVIEW OF SYSTEMS:  Review of Systems  Constitutional: Negative for chills, fever, malaise/fatigue and weight loss.  HENT: Negative for ear pain, hearing loss and tinnitus.   Eyes: Negative for blurred vision, double vision, pain and redness.  Respiratory: Negative for cough, hemoptysis and shortness of breath.   Cardiovascular: Positive for chest pain. Negative for palpitations, orthopnea and leg swelling.  Gastrointestinal: Positive for abdominal pain and nausea. Negative for constipation, diarrhea and vomiting.  Genitourinary: Negative for dysuria, frequency and hematuria.  Musculoskeletal: Negative for back pain, joint pain and neck pain.  Skin:       No acne, rash, or lesions  Neurological: Negative for dizziness, tremors, focal weakness and weakness.  Endo/Heme/Allergies: Negative for polydipsia. Does not bruise/bleed easily.  Psychiatric/Behavioral: Negative for depression. The patient is not nervous/anxious and does not have insomnia.      VITAL SIGNS:   Vitals:   06/24/16 2200 06/24/16 2230 06/24/16 2231 06/24/16 2301  BP: (!) 151/88 (!) 145/94 (!) 145/94 (!) 147/93  Pulse: 68 60 66 66  Resp: 12  16 16   Temp:      TempSrc:      SpO2: 98% 97% 96% 98%  Weight:      Height:       Wt Readings from Last 3 Encounters:  06/24/16 110.7 kg (244 lb)  06/19/16 110.7 kg (244 lb)  04/24/16 110.7 kg (244 lb)    PHYSICAL EXAMINATION:  Physical Exam  Vitals reviewed. Constitutional: He is oriented to person, place, and time. He appears well-developed and well-nourished. No distress.  HENT:  Head: Normocephalic and atraumatic.  Mouth/Throat: Oropharynx is clear and moist.  Eyes: Conjunctivae and EOM are normal. Pupils are equal, round, and reactive to light. No scleral icterus.  Neck: Normal range of motion. Neck supple. No JVD present. No thyromegaly present.   Cardiovascular: Normal rate, regular rhythm and intact distal pulses.  Exam reveals no gallop and no friction rub.   No murmur heard. Respiratory: Effort normal and breath sounds normal. No respiratory distress. He has no wheezes. He has no rales.  GI: Soft. Bowel sounds are normal. He exhibits no distension. There is tenderness (Left upper quadrant).  Musculoskeletal: Normal range of motion. He exhibits no edema.  No arthritis, no gout  Lymphadenopathy:    He has no cervical adenopathy.  Neurological: He is alert and oriented to person, place, and time. No cranial nerve deficit.  No dysarthria, no aphasia  Skin: Skin is warm and dry. No rash noted. No erythema.  Psychiatric: He has a normal mood and affect. His behavior is normal. Judgment and thought content normal.    LABORATORY PANEL:   CBC  Recent Labs Lab 06/24/16 1927  WBC 6.0  HGB 12.2*  HCT 39.0*  PLT 233   ------------------------------------------------------------------------------------------------------------------  Chemistries   Recent Labs Lab 06/24/16 1927  NA 140  K 3.9  CL 107  CO2 29  GLUCOSE 127*  BUN 12  CREATININE 0.89  CALCIUM 9.0  AST 19  ALT 16*  ALKPHOS 82  BILITOT 0.6   ------------------------------------------------------------------------------------------------------------------  Cardiac Enzymes  Recent Labs Lab 06/24/16 1927  TROPONINI <0.03   ------------------------------------------------------------------------------------------------------------------  RADIOLOGY:  Dg Chest 2 View  Result Date: 06/24/2016 CLINICAL DATA:  Pt presents to Ed via [facility] with c/o left sided pressure chest pain and URQ/ULQ pain, associated with nausea/vomiting for last 2 days. EXAM: CHEST - 2 VIEW COMPARISON:  04/03/2016 FINDINGS: Previous median sternotomy and CABG. Heart size upper limits normal. There is some linear scarring in the left mid and lower lung as before. Right lung clear. No new  infiltrate or overt edema. No effusion or pneumothorax. Anterior vertebral endplate spurring at multiple levels in the mid and lower thoracic spine. IMPRESSION: 1. No acute abnormality post CABG. Electronically Signed   By: Lucrezia Europe M.D.   On: 06/24/2016 20:02   Ct Abdomen Pelvis W Contrast  Result Date: 06/24/2016 CLINICAL DATA:  Pt presents to Ed via [facility] with c/o left sided pressure chest pain and RUQ/LUQ pain, associated with nausea/vomiting for last 2 days. EXAM: CT ABDOMEN AND PELVIS WITH CONTRAST TECHNIQUE: Multidetector CT imaging of the abdomen and pelvis was performed using the standard protocol following bolus administration of intravenous contrast. CONTRAST:  163m ISOVUE-300 IOPAMIDOL (ISOVUE-300) INJECTION 61% COMPARISON:  04/22/2016 and prior studies FINDINGS: Lower chest:  No acute findings. Hepatobiliary: No masses or other significant abnormality. Cholecystectomy clips. Pancreas: No mass, inflammatory changes, or other significant abnormality. Spleen: Within normal limits in size and appearance. Adrenals/Urinary Tract: 12 mm right and 14 mm left adrenal nodules , present since scans dating back to 2012. No hydronephrosis. No urolithiasis. Urinary bladder is nondistended, appearing diffusely thick walled. Stomach/Bowel: Small duodenal diverticulum. Stomach and small bowel are nondilated. Normal appendix. Scattered sigmoid diverticula without adjacent inflammatory/edematous change. Colon is nondilated. Vascular/Lymphatic: Scattered aortoiliac arterial calcifications without aneurysm or stenosis. Portal vein patent. No adenopathy localized. Reproductive: Mild prostatic enlargement. Other: No ascites.  No free air. Musculoskeletal: Mild spondylitic changes in the lower thoracic spine. IMPRESSION: 1. No acute abdominal process. 2. Chronic and ancillary findings as above Electronically Signed   By: DLucrezia EuropeM.D.   On: 06/24/2016 21:04    EKG:   Orders placed or performed during the hospital  encounter of 06/24/16  . EKG 12-Lead  . EKG 12-Lead  . ED EKG within 10 minutes  . ED EKG within 10  minutes    IMPRESSION AND PLAN:  Principal Problem:   Angina pectoris ([place]) - EKG without acute ischemic findings, initial troponin negative. Pain remains difficult to control. We will admit him, trend his cardiac enzymes tonight, get a cardiology consult. Given his history, it is possible that his pain is more related to some GI etiology. If his symptoms do not improve and he rules out tonight for ACS with might consider GI consult. Active Problems:   CAD (coronary artery disease) - continue home meds, this includes Imdur and Ranexa for antianginals   HTN (hypertension) - continue home medications   DM (diabetes mellitus) ([facility]) - slight scale insulin with corresponding glucose checks and carb modified diet   Chronic diastolic CHF (congestive heart failure) ([facility]) - currently stable, continue home meds   PAF (paroxysmal atrial fibrillation) ([place]) - continue home rate controlling medications as well as anticoagulation   Anxiety - continue home anxiolytic   GERD (gastroesophageal reflux disease) - home dose PPI   HLD (hyperlipidemia) - continue home anti-lipids   COPD (chronic obstructive pulmonary disease) ([place]) - continue home inhalers  All the records are reviewed and case discussed with ED provider. Management plans discussed with the patient and/or family.  DVT PROPHYLAXIS: Systemic anticoagulation  GI PROPHYLAXIS: PPI  ADMISSION STATUS: Observation  CODE STATUS: Full Code Status History    Date Active Date Inactive Code Status Order ID Comments User Context   04/03/2016  8:55 PM 04/07/2016  5:59 PM Full Code [id number]  Theodoro Grist, MD Inpatient   03/26/2016 10:24 PM 03/28/2016  6:00 PM Full Code [id number]  Lance Coon, MD Inpatient   10/07/2015  9:36 AM 10/07/2015  6:30 PM Full Code [id number]  Dionisio Kaileigh Viswanathan, MD Inpatient   10/07/2015  8:26 AM 10/07/2015  9:36 AM Full Code  [id number]  Dionisio Daivion Pape, MD Inpatient   10/05/2015  7:29 PM 10/07/2015  8:26 AM Full Code [id number]  Idelle Crouch, MD Inpatient   09/12/2015  9:29 PM 09/14/2015  2:25 PM Full Code [id number]  Demetrios Loll, MD Inpatient   08/01/2015  5:21 AM 08/02/2015  1:46 PM Full Code [id number]  Lance Coon, MD Inpatient   07/12/2015  3:24 PM 07/13/2015  8:17 PM Full Code [id number]  Belva Crome, MD Inpatient   07/11/2015  8:00 PM 07/12/2015  3:24 PM Full Code [id number]  Lonn Georgia, PA-C Inpatient   06/26/2015  5:43 AM 06/27/2015  3:30 PM Full Code [id number]  Juluis Mire, MD Inpatient      TOTAL TIME TAKING CARE OF THIS PATIENT: 40 minutes.    Kiaria Quinnell FIELDING 06/24/2016, 11:36 PM  [facility]  Office  [phone]  CC: Primary care physician; [name], MD

## 2016-06-24 NOTE — ED Provider Notes (Signed)
Assumed care from Dr. Alfred Levins at 9 PM. CT abdomen and pelvis unremarkable. Patient deals minimally better. He does report that he has a history of pancreatitis so I have added on a lipase. We'll give GI cocktail and IV Pepcid. Plan from Dr. Alfred Levins is to observe in hospital for serial troponins overnight.   Carrie Mew, MD 06/24/16 [phone]

## 2016-06-24 NOTE — ED Provider Notes (Signed)
[facility] Emergency Department Provider Note  ____________________________________________  Time seen: Approximately 7:40 PM  I have reviewed the triage vital signs and the nursing notes.   HISTORY  Chief Complaint Chest Pain and Abdominal Pain   HPI Lawrence [name] is a 60 y.o. male a history of coronary artery disease on Plavix, paroxysmal atrial fibrillation on Eliquis, anemia, celiac disease, CHF, COPD, hypertension, diabetes who presents for evaluation of chest pain and abdominal pain. Patient reports that both symptoms started yesterday in the afternoon. His chest pain is substernal, pressure, associated with shortness of breath, worse with exertion, similar to his COPD exacerbations. His been having wheezing and chills but no fever or changes in his chronic cough. He reports that he has been using his inhalers at home but those have not been helping. This morning the pain got worse and shortness of breath as well which prompted him to take 3 sublingual nitros with no changes in his symptoms. Patient reports that his usual anginal pain response to sublingual nitroglycerin. His last left heart catheter was on May 2017 showing stable disease. Patient is also complaining of left upper quadrant abdominal pain that he describes as a squeezing pain, 8/10, nonradiating, constant since last night, associated with nausea. No vomiting, no diarrhea, no constipation, no fever, no urinary symptoms. Patient has had this pain in the past. He underwent an EGD that was normal. Also underwent CT scan in June which was negative. Patient is followed by GI and has a repeat endoscopy scheduled for next month for concerns of possible slow GI bleed. Patient denies melena, hematemesis, hematochezia.  Past Medical History:  Diagnosis Date  . A-fib ([place])   . Anemia   . Anxiety   . Asthma   . CAD (coronary artery disease)    a. 2002 CABGx2 (LIMA->LAD, VG->VG->OM1);  b. 09/2012 DES->OM;   c. 03/2015 PTCA of LAD [facility]) in setting of atretic LIMA; d. 05/2015 Cath [facility]): nonobs dzs; e. 06/2015 Cath ([facility]): LM nl, LAD 45p/d ISR, 50d, D1/2 small, LCX 50p/d ISR, OM1 70ost, 30 ISR, VG->OM1 50ost, 60m LIMA->LAD 99p/d - atretic, RCA dom, nl; f.cath 10/16: 40-50%(FFR 0.90) pLAD, 75% (FFR 0.77) mLAD s/p PCI/DES, oRCA 40% (FFR0.95)  . Celiac disease   . Chronic diastolic CHF (congestive heart failure) ([place]    a. 06/2009 Echo: EF 60-65%, Gr 1 DD, triv AI, mildly dil LA, nl RV.  .Marland KitchenCOPD (chronic obstructive pulmonary disease) ([place]    a. Chronic bronchitis and emphysema.  .Marland KitchenDysrhythmia   . Essential hypertension   . History of tobacco abuse    a. Quit 2014.  .Marland KitchenPSVT (paroxysmal supraventricular tachycardia) ([place]    a. 10/2012 Noted on Zio Patch.  . Type II diabetes mellitus ([facility]     Patient Active Problem List   Diagnosis Date Noted  . Dyspnea 04/03/2016  . Hypotension 04/03/2016  . Chronic kidney disease 04/03/2016  . Anemia 04/03/2016  . Paroxysmal atrial fibrillation ([place] 12/23/2015  . OSA (obstructive sleep apnea) 12/10/2015  . Left inguinal hernia 11/07/2015  . Anxiety 11/07/2015  . Unstable angina ([place] 10/05/2015  . PAF (paroxysmal atrial fibrillation) ([place] 10/05/2015  . Back pain with left-sided radiculopathy 09/30/2015  . Nocturnal hypoxia 09/06/2015  . BPH (benign prostatic hyperplasia) 08/01/2015  . Chronic diastolic CHF (congestive heart failure) ([place]   . Angina at rest ([facility]   . Chest pain 07/11/2015  . COPD (chronic obstructive pulmonary disease) ([place] 07/03/2015  . CAD (coronary artery disease)  06/26/2015  . HTN (hypertension) 06/26/2015  . DM (diabetes mellitus) ([place]) 06/26/2015  . Achalasia 07/24/2014  . GERD (gastroesophageal reflux disease) 06/07/2014  . Former tobacco use 04/11/2013  . HLD (hyperlipidemia) 04/09/2013    Past Surgical History:  Procedure Laterality Date  . BYPASS GRAFT    . CARDIAC CATHETERIZATION N/A 07/12/2015   rocedure: Left  Heart Cath and Cors/Grafts Angiography;  Surgeon: Belva Crome, MD;  Location: [place] [facility];  Service: Cardiovascular;  Laterality: N/A;  . CARDIAC CATHETERIZATION Right 10/07/2015   Procedure: Left Heart Cath and Cors/Grafts Angiography;  Surgeon: Dionisio David, MD;  Location: [place] [facility];  Service: Cardiovascular;  Laterality: Right;  . CARDIAC CATHETERIZATION N/A 04/06/2016   Procedure: Left Heart Cath and Coronary Angiography;  Surgeon: Yolonda Kida, MD;  Location: [place] [facility];  Service: Cardiovascular;  Laterality: N/A;  . CARDIAC CATHETERIZATION  04/06/2016   Procedure: Bypass Graft Angiography;  Surgeon: Yolonda Kida, MD;  Location: [place] [facility];  Service: Cardiovascular;;  . CHOLECYSTECTOMY    . ESOPHAGEAL DILATION    . TONSILLECTOMY    . VASCULAR SURGERY      Prior to Admission medications   Medication Sig Start Date End Date Taking? Authorizing Provider  albuterol (PROVENTIL HFA;VENTOLIN HFA) 108 (90 Base) MCG/ACT inhaler Inhale 4-6 puffs into the lungs every 4 (four) hours as needed for wheezing or shortness of breath. Reported on 04/08/2016   Yes Historical Provider, MD  allopurinol (ZYLOPRIM) 100 MG tablet THREE (3) TABLETS TWICE DAILY 04/25/16  Yes Birdie Sons, MD  ALPRAZolam Duanne Moron) 1 MG tablet Take 0.5-1 tablets (0.5-1 mg total) by mouth 3 (three) times daily as needed for anxiety. 05/26/16  Yes Birdie Sons, MD  amLODipine (NORVASC) 10 MG tablet Take 10 mg by mouth daily.   Yes Historical Provider, MD  apixaban (ELIQUIS) 5 MG TABS tablet Take 5 mg by mouth 2 (two) times daily.   Yes Historical Provider, MD  atorvastatin (LIPITOR) 80 MG tablet TAKE 1 TABLET BY MOUTH EVERY NIGHT AT BEDTIME. 05/26/16  Yes Birdie Sons, MD  cetirizine (ZYRTEC) 10 MG tablet TAKE ONE TABLET AT BEDTIME 04/19/16  Yes Birdie Sons, MD  clopidogrel (PLAVIX) 75 MG tablet Take 1 tablet (75 mg total) by mouth daily. 10/08/15  Yes Nicholes Mango, MD    cyclobenzaprine (FLEXERIL) 10 MG tablet Take 1 tablet (10 mg total) by mouth every 8 (eight) hours as needed for muscle spasms. 01/08/16  Yes Birdie Sons, MD  fluticasone furoate-vilanterol (BREO ELLIPTA) 100-25 MCG/INH AEPB Inhale 1 puff into the lungs daily.    Yes Historical Provider, MD  furosemide (LASIX) 80 MG tablet Take 80 mg by mouth 2 (two) times daily.   Yes Historical Provider, MD  isosorbide mononitrate (IMDUR) 60 MG 24 hr tablet Take 120 mg by mouth daily. Reported on 04/24/2016   Yes Historical Provider, MD  lansoprazole (PREVACID) 30 MG capsule Take 30 mg by mouth daily.    Yes Historical Provider, MD  lisinopril (PRINIVIL,ZESTRIL) 5 MG tablet Take 5 mg by mouth daily.   Yes Historical Provider, MD  magnesium oxide (MAG-OX) 400 (241.3 Mg) MG tablet Take 400 mg by mouth daily.   Yes Historical Provider, MD  metFORMIN (GLUCOPHAGE) 500 MG tablet Take 500 mg by mouth daily with breakfast.    Yes Historical Provider, MD  metoprolol tartrate (LOPRESSOR) 25 MG tablet Take 50 mg by mouth 2 (two) times daily.  Yes Historical Provider, MD  nitroGLYCERIN (NITROSTAT) 0.4 MG SL tablet Place 0.4 mg under the tongue every 5 (five) minutes as needed for chest pain. Reported on 05/26/2016   Yes Historical Provider, MD  omega-3 acid ethyl esters (LOVAZA) 1 g capsule Take 4 g by mouth daily.   Yes Historical Provider, MD  oxyCODONE-acetaminophen (PERCOCET) 10-325 MG tablet Take 1 tablet by mouth every 4 (four) hours as needed for pain. 06/19/16  Yes Birdie Sons, MD  RANEXA 500 MG 12 hr tablet TAKE 2 TABLETS BY MOUTH TWICE A DAY. 04/09/16  Yes Birdie Sons, MD  sotalol (BETAPACE) 120 MG tablet Take 120 mg by mouth 2 (two) times daily.    Yes Historical Provider, MD  tamsulosin (FLOMAX) 0.4 MG CAPS capsule Take 0.4 mg by mouth daily after breakfast.    Yes Historical Provider, MD  tiotropium (SPIRIVA) 18 MCG inhalation capsule Place 18 mcg into inhaler and inhale daily.   Yes Historical Provider,  MD  dicyclomine (BENTYL) 20 MG tablet Take 1 tablet (20 mg total) by mouth 3 (three) times daily as needed (abdominal pain). Patient not taking: Reported on 06/24/2016 04/24/16   Nance Pear, MD  ondansetron (ZOFRAN) 4 MG tablet Take 1 tablet (4 mg total) by mouth every 8 (eight) hours as needed for nausea or vomiting. Patient not taking: Reported on 06/24/2016 04/24/16   Nance Pear, MD  sucralfate (CARAFATE) 1 g tablet Take 1 tablet (1 g total) by mouth 2 (two) times daily. Patient not taking: Reported on 06/24/2016 04/22/16   [name], MD    Allergies Demerol [meperidine]; Prednisone; Sulfa antibiotics; Albuterol sulfate [albuterol]; and Morphine sulfate  Family History  Problem Relation Age of Onset  . Heart attack Mother   . Depression Mother   . Heart disease Mother   . COPD Mother   . Hypertension Mother   . Heart attack Father   . Diabetes Father   . Depression Father   . Heart disease Father   . Cirrhosis Father   . Parkinson's disease Brother     Social History Social History  Substance Use Topics  . Smoking status: Former Smoker    Quit date: 04/22/2013  . Smokeless tobacco: Never Used  . Alcohol use No     Comment: remotely quit alcohol use. Hx of heavy alcohol use.    Review of Systems  Constitutional: Negative for fever. + chills Eyes: Negative for visual changes. ENT: Negative for sore throat. Cardiovascular: + chest pain. Respiratory: + shortness of breath. Gastrointestinal: + LUQ abdominal pain, nausea. No vomiting or diarrhea. Genitourinary: Negative for dysuria. Musculoskeletal: Negative for back pain. Skin: Negative for rash. Neurological: Negative for headaches, weakness or numbness.  ____________________________________________   PHYSICAL EXAM:  VITAL SIGNS: ED Triage Vitals [06/24/16 1925]  Enc Vitals Group     BP (!) 173/101     Pulse Rate 70     Resp 16     Temp 98.2 F (36.8 C)     Temp Source Oral     SpO2 100 %     Weight  244 lb (110.7 kg)     Height 5' 7"  (1.702 m)     Head Circumference      Peak Flow      Pain Score      Pain Loc      Pain Edu?      Excl. in [place]?     Constitutional: Alert and oriented. Well appearing and in no  apparent distress. HEENT:      Head: Normocephalic and atraumatic.         Eyes: Conjunctivae are normal. Sclera is non-icteric. EOMI. PERRL      Mouth/Throat: Mucous membranes are moist.       Neck: Supple with no signs of meningismus. Cardiovascular: Regular rate and rhythm. No murmurs, gallops, or rubs. 2+ symmetrical distal pulses are present in all extremities. No JVD. Respiratory: Diminished air movement bilaterally with expiratory wheezes throughout and, normal work of breathing, satting 100% on 2 L nasal cannula which is patient's baseline.  Gastrointestinal: Soft, tender to palpation on the left upper quadrant, and non distended with positive bowel sounds. No rebound or guarding. Genitourinary: No CVA tenderness. Musculoskeletal: Nontender with normal range of motion in all extremities. No edema, cyanosis, or erythema of extremities. Neurologic: Normal speech and language. Face is symmetric. Moving all extremities. No gross focal neurologic deficits are appreciated. Skin: Skin is warm, dry and intact. No rash noted. Psychiatric: Mood and affect are normal. Speech and behavior are normal.  ____________________________________________   LABS (all labs ordered are listed, but only abnormal results are displayed)  Labs Reviewed  COMPREHENSIVE METABOLIC PANEL - Abnormal; Notable for the following:       Result Value   Glucose, Bld 127 (*)    ALT 16 (*)    Anion gap 4 (*)    All other components within normal limits  CBC - Abnormal; Notable for the following:    Hemoglobin 12.2 (*)    HCT 39.0 (*)    MCV 77.3 (*)    MCH 24.3 (*)    MCHC 31.4 (*)    RDW 18.6 (*)    All other components within normal limits  BRAIN NATRIURETIC PEPTIDE  TROPONIN I    ____________________________________________  EKG  ED ECG REPORT I, Rudene Re, the attending physician, personally viewed and interpreted this ECG.  Normal sinus rhythm, rate of 73, normal intervals, normal axis, no ST elevations or depressions. ____________________________________________  RADIOLOGY  CXR: No acute abnormality post CABG.  Ct a/p: pending ____________________________________________   PROCEDURES  Procedure(s) performed: None Procedures Critical Care performed:  None ____________________________________________   INITIAL IMPRESSION / ASSESSMENT AND PLAN / ED COURSE  60 y.o. male a history of coronary artery disease on Plavix, paroxysmal atrial fibrillation on Eliquis, anemia, celiac disease, CHF, COPD, hypertension, diabetes who presents for evaluation of chest pain and abdominal pain.  #CP: Patient reports that the pain is similar to his prior COPD exacerbations, he has decreased air movement bilaterally and expiratory wheezes. Pain was not responsive to nitroglycerin which patient received per EMS and also had 3 doses sublingual at home. Will give Xopenex this patient has a history of A. fib and reassess. We'll not give steroids this patient reports that he usually goes into A. fib with steroids. We'll get a chest x-ray. EKG is nonischemic. We'll cycle troponins and get a BNP.  # abdominal pain: Patient with left upper quadrant abdominal pain and tenderness on exam, no rebound or guarding, remainder of his abdominal exam is reassuring. Differential diagnoses including peptic ulcer disease versus gastritis versus diverticulitis. This pain seems to be chronic for the patient as there are multiple notes mentioning this pain that patient has had. Plan for CT a/p, labs. We'll treat symptoms with IV morphine and IV Zofran.  Plan to admit for CP rule out after CT ap done. Care transferred to Dr. Joni Fears at 8:45 PM   Clinical Course  Pertinent labs &  imaging results that were available during my care of the patient were reviewed by me and considered in my medical decision making (see chart for details).    ____________________________________________   FINAL CLINICAL IMPRESSION(S) / ED DIAGNOSES  Final diagnoses:  Chest pain, unspecified chest pain type  COPD exacerbation ([place])  Left lower quadrant pain      NEW MEDICATIONS STARTED DURING THIS VISIT:  New Prescriptions   No medications on file     Note:  This document was prepared using Dragon voice recognition software and may include unintentional dictation errors.    Rudene Re, MD 06/24/16 2044

## 2016-06-25 ENCOUNTER — Ambulatory Visit: Payer: Self-pay | Admitting: *Deleted

## 2016-06-25 DIAGNOSIS — E119 Type 2 diabetes mellitus without complications: Secondary | ICD-10-CM | POA: Diagnosis not present

## 2016-06-25 DIAGNOSIS — R079 Chest pain, unspecified: Secondary | ICD-10-CM | POA: Diagnosis not present

## 2016-06-25 DIAGNOSIS — I25719 Atherosclerosis of autologous vein coronary artery bypass graft(s) with unspecified angina pectoris: Secondary | ICD-10-CM | POA: Diagnosis not present

## 2016-06-25 DIAGNOSIS — F419 Anxiety disorder, unspecified: Secondary | ICD-10-CM | POA: Diagnosis not present

## 2016-06-25 DIAGNOSIS — R112 Nausea with vomiting, unspecified: Secondary | ICD-10-CM | POA: Diagnosis not present

## 2016-06-25 DIAGNOSIS — I2510 Atherosclerotic heart disease of native coronary artery without angina pectoris: Secondary | ICD-10-CM | POA: Diagnosis not present

## 2016-06-25 DIAGNOSIS — J441 Chronic obstructive pulmonary disease with (acute) exacerbation: Secondary | ICD-10-CM | POA: Diagnosis not present

## 2016-06-25 LAB — GLUCOSE, CAPILLARY
Glucose-Capillary: 111 mg/dL — ABNORMAL HIGH (ref 65–99)
Glucose-Capillary: 121 mg/dL — ABNORMAL HIGH (ref 65–99)
Glucose-Capillary: 127 mg/dL — ABNORMAL HIGH (ref 65–99)
Glucose-Capillary: 134 mg/dL — ABNORMAL HIGH (ref 65–99)
Glucose-Capillary: 151 mg/dL — ABNORMAL HIGH (ref 65–99)

## 2016-06-25 LAB — CBC
HCT: 36 % — ABNORMAL LOW (ref 40.0–52.0)
Hemoglobin: 11.4 g/dL — ABNORMAL LOW (ref 13.0–18.0)
MCH: 24.7 pg — ABNORMAL LOW (ref 26.0–34.0)
MCHC: 31.8 g/dL — ABNORMAL LOW (ref 32.0–36.0)
MCV: 77.8 fL — ABNORMAL LOW (ref 80.0–100.0)
Platelets: 223 10*3/uL (ref 150–440)
RBC: 4.63 MIL/uL (ref 4.40–5.90)
RDW: 19.2 % — ABNORMAL HIGH (ref 11.5–14.5)
WBC: 5.3 10*3/uL (ref 3.8–10.6)

## 2016-06-25 LAB — BASIC METABOLIC PANEL
Anion gap: 3 — ABNORMAL LOW (ref 5–15)
BUN: 13 mg/dL (ref 6–20)
CO2: 31 mmol/L (ref 22–32)
Calcium: 8.7 mg/dL — ABNORMAL LOW (ref 8.9–10.3)
Chloride: 105 mmol/L (ref 101–111)
Creatinine, Ser: 1.05 mg/dL (ref 0.61–1.24)
GFR calc Af Amer: >60 mL/min (ref >60)
GFR calc non Af Amer: >60 mL/min (ref >60)
Glucose, Bld: 112 mg/dL — ABNORMAL HIGH (ref 65–99)
Potassium: 4.3 mmol/L (ref 3.5–5.1)
Sodium: 139 mmol/L (ref 135–145)

## 2016-06-25 LAB — TROPONIN I
Troponin I: <0.03 ng/mL (ref <0.03)
Troponin I: <0.03 ng/mL (ref <0.03)
Troponin I: <0.03 ng/mL (ref <0.03)

## 2016-06-25 MED ORDER — POLYETHYLENE GLYCOL 3350 17 G PO PACK
17.0000 g | PACK | Freq: Every day | ORAL | Status: DC
  Administered 2016-06-25: 17 g via ORAL
  Filled 2016-06-25 (×2): qty 1

## 2016-06-25 MED ORDER — OXYCODONE HCL 5 MG PO TABS
5.0000 mg | ORAL_TABLET | Freq: Four times a day (QID) | ORAL | Status: DC | PRN
  Administered 2016-06-25 – 2016-06-26 (×2): 5 mg via ORAL
  Filled 2016-06-25 (×2): qty 1

## 2016-06-25 MED ORDER — RANOLAZINE ER 500 MG PO TB12
1000.0000 mg | ORAL_TABLET | Freq: Two times a day (BID) | ORAL | Status: DC
  Administered 2016-06-25 – 2016-06-26 (×4): 1000 mg via ORAL
  Filled 2016-06-25 (×4): qty 2

## 2016-06-25 MED ORDER — OXYCODONE-ACETAMINOPHEN 10-325 MG PO TABS: 1.0000 | ORAL_TABLET | Freq: Four times a day (QID) | ORAL | Status: DC | PRN

## 2016-06-25 MED ORDER — SOTALOL HCL 80 MG PO TABS: 80.0000 mg | ORAL_TABLET | Freq: Two times a day (BID) | ORAL | Status: DC

## 2016-06-25 MED ORDER — ONDANSETRON HCL 4 MG/2ML IJ SOLN
4.0000 mg | Freq: Four times a day (QID) | INTRAMUSCULAR | Status: DC | PRN
  Administered 2016-06-25: 4 mg via INTRAVENOUS
  Filled 2016-06-25: qty 2

## 2016-06-25 MED ORDER — ATORVASTATIN CALCIUM 20 MG PO TABS
80.0000 mg | ORAL_TABLET | Freq: Every day | ORAL | Status: DC
  Administered 2016-06-25 (×2): 80 mg via ORAL
  Filled 2016-06-25 (×2): qty 4

## 2016-06-25 MED ORDER — ACETAMINOPHEN 650 MG RE SUPP: 650.0000 mg | Freq: Four times a day (QID) | RECTAL | Status: DC | PRN

## 2016-06-25 MED ORDER — METOPROLOL TARTRATE 25 MG PO TABS
25.0000 mg | ORAL_TABLET | Freq: Two times a day (BID) | ORAL | Status: DC
  Administered 2016-06-25: 25 mg via ORAL
  Filled 2016-06-25 (×2): qty 1

## 2016-06-25 MED ORDER — INSULIN ASPART 100 UNIT/ML ~~LOC~~ SOLN: 0.0000 [IU] | Freq: Every day | SUBCUTANEOUS | Status: DC

## 2016-06-25 MED ORDER — ALLOPURINOL 300 MG PO TABS
300.0000 mg | ORAL_TABLET | Freq: Two times a day (BID) | ORAL | Status: DC
  Administered 2016-06-25 – 2016-06-26 (×4): 300 mg via ORAL
  Filled 2016-06-25 (×4): qty 1

## 2016-06-25 MED ORDER — CLOPIDOGREL BISULFATE 75 MG PO TABS
75.0000 mg | ORAL_TABLET | Freq: Every day | ORAL | Status: DC
  Administered 2016-06-25 – 2016-06-26 (×2): 75 mg via ORAL
  Filled 2016-06-25 (×2): qty 1

## 2016-06-25 MED ORDER — PANTOPRAZOLE SODIUM 40 MG PO TBEC
40.0000 mg | DELAYED_RELEASE_TABLET | Freq: Every day | ORAL | Status: DC
  Administered 2016-06-25 – 2016-06-26 (×2): 40 mg via ORAL
  Filled 2016-06-25 (×2): qty 1

## 2016-06-25 MED ORDER — METOPROLOL TARTRATE 50 MG PO TABS
50.0000 mg | ORAL_TABLET | Freq: Two times a day (BID) | ORAL | Status: DC
  Administered 2016-06-25: 50 mg via ORAL
  Filled 2016-06-25 (×2): qty 1

## 2016-06-25 MED ORDER — APIXABAN 5 MG PO TABS
5.0000 mg | ORAL_TABLET | Freq: Two times a day (BID) | ORAL | Status: DC
  Administered 2016-06-25 – 2016-06-26 (×4): 5 mg via ORAL
  Filled 2016-06-25 (×4): qty 1

## 2016-06-25 MED ORDER — SODIUM CHLORIDE 0.9% FLUSH
3.0000 mL | Freq: Two times a day (BID) | INTRAVENOUS | Status: DC
  Administered 2016-06-25 – 2016-06-26 (×4): 3 mL via INTRAVENOUS

## 2016-06-25 MED ORDER — TAMSULOSIN HCL 0.4 MG PO CAPS
0.4000 mg | ORAL_CAPSULE | Freq: Every day | ORAL | Status: DC
  Administered 2016-06-25 – 2016-06-26 (×2): 0.4 mg via ORAL
  Filled 2016-06-25 (×2): qty 1

## 2016-06-25 MED ORDER — OXYCODONE-ACETAMINOPHEN 5-325 MG PO TABS: 1.0000 | ORAL_TABLET | Freq: Four times a day (QID) | ORAL | Status: DC | PRN

## 2016-06-25 MED ORDER — AMLODIPINE BESYLATE 5 MG PO TABS
10.0000 mg | ORAL_TABLET | Freq: Every day | ORAL | Status: DC
  Administered 2016-06-25 – 2016-06-26 (×2): 10 mg via ORAL
  Filled 2016-06-25 (×2): qty 2

## 2016-06-25 MED ORDER — FUROSEMIDE 40 MG PO TABS
80.0000 mg | ORAL_TABLET | Freq: Two times a day (BID) | ORAL | Status: DC
  Administered 2016-06-25 – 2016-06-26 (×3): 80 mg via ORAL
  Filled 2016-06-25 (×3): qty 2

## 2016-06-25 MED ORDER — FLUTICASONE FUROATE-VILANTEROL 100-25 MCG/INH IN AEPB
1.0000 | INHALATION_SPRAY | Freq: Every day | RESPIRATORY_TRACT | Status: DC
  Administered 2016-06-25 – 2016-06-26 (×2): 1 via RESPIRATORY_TRACT
  Filled 2016-06-25: qty 28

## 2016-06-25 MED ORDER — ONDANSETRON HCL 4 MG PO TABS
4.0000 mg | ORAL_TABLET | Freq: Four times a day (QID) | ORAL | Status: DC | PRN
  Administered 2016-06-25: 4 mg via ORAL
  Filled 2016-06-25: qty 1

## 2016-06-25 MED ORDER — ACETAMINOPHEN 325 MG PO TABS: 650.0000 mg | ORAL_TABLET | Freq: Four times a day (QID) | ORAL | Status: DC | PRN

## 2016-06-25 MED ORDER — SOTALOL HCL 120 MG PO TABS
120.0000 mg | ORAL_TABLET | Freq: Two times a day (BID) | ORAL | Status: DC
  Administered 2016-06-25: 120 mg via ORAL
  Filled 2016-06-25 (×2): qty 1

## 2016-06-25 MED ORDER — ALPRAZOLAM 0.5 MG PO TABS
0.5000 mg | ORAL_TABLET | Freq: Three times a day (TID) | ORAL | Status: DC | PRN
  Administered 2016-06-25: 0.5 mg via ORAL
  Filled 2016-06-25: qty 1

## 2016-06-25 MED ORDER — TIOTROPIUM BROMIDE MONOHYDRATE 18 MCG IN CAPS
18.0000 ug | ORAL_CAPSULE | Freq: Every day | RESPIRATORY_TRACT | Status: DC
  Administered 2016-06-25 – 2016-06-26 (×2): 18 ug via RESPIRATORY_TRACT
  Filled 2016-06-25: qty 5

## 2016-06-25 MED ORDER — ISOSORBIDE MONONITRATE ER 60 MG PO TB24
120.0000 mg | ORAL_TABLET | Freq: Every day | ORAL | Status: DC
  Administered 2016-06-25 – 2016-06-26 (×2): 120 mg via ORAL
  Filled 2016-06-25 (×2): qty 2

## 2016-06-25 MED ORDER — LISINOPRIL 5 MG PO TABS
5.0000 mg | ORAL_TABLET | Freq: Every day | ORAL | Status: DC
  Administered 2016-06-25 – 2016-06-26 (×2): 5 mg via ORAL
  Filled 2016-06-25 (×2): qty 1

## 2016-06-25 MED ORDER — INSULIN ASPART 100 UNIT/ML ~~LOC~~ SOLN
0.0000 [IU] | Freq: Three times a day (TID) | SUBCUTANEOUS | Status: DC
  Administered 2016-06-25 (×2): 1 [IU] via SUBCUTANEOUS
  Administered 2016-06-25: 2 [IU] via SUBCUTANEOUS
  Filled 2016-06-25 (×2): qty 2
  Filled 2016-06-25: qty 1

## 2016-06-25 NOTE — Consult Note (Signed)
[place]  CARDIOLOGY CONSULT NOTE  Patient ID: Lawrence [name] MRN: [id number] DOB/AGE: 1956/10/08 60 y.o.  Admit date: 06/24/2016 Referring Physician Dr. Vianne Bulls Primary Physician   Primary Cardiologist Dr. [name] Reason for Consultation chest pain  HPI: Patient is a 60 year old male with history of coronary artery disease status post coronary artery bypass grafting 2 in 2002 with a left internal mammary to the LAD and a saphenous vein graft to the first marginal. He underwent PCI of the OM in January 2013. He underwent cardiac catheterization 2 since that time in July and August of 2016 which revealed no progression of his disease. He was admitted to [facility] in May 2017 and cardiac catheterization at that time revealed also a patent stent in the anastomotic area of the left internal mammary to the LAD as well as a stent in the OM. He had some distal and small vessel disease. Medical management was recommended. He now returns with complaints of cough, shortness of breath and bilateral upper quadrant abdominal and lower chest pain. Pain is worse with deep palpation in the subcostal regions the right and left side. It is also worse with deep breathing. EKG showed no ischemia and he is ruled out for myocardial infarction with no evidence of significant troponin elevation. Review his most recent catheter in comparison to more recent previous diagnostic studies revealed no disease amenable to PCI. He reports compliance with his medication and he is on aggressive regimen to include amlodipine, Ranexa, long-acting nitrates, 120 mg of long-acting nitrates, lisinopril 5 mg daily for afterload reduction, metoprolol 50 mg twice daily and Lasix for symptom relief. He is on apixaban for anticoagulation for his atrial fibrillation. He states that when he coughs some of the sputum was dark colored but no frank bleeding. No melena.   Review of Systems  Constitutional:  Positive for malaise/fatigue.  HENT: Negative.   Eyes: Negative.   Respiratory: Positive for cough, sputum production and shortness of breath.   Cardiovascular: Positive for chest pain.  Gastrointestinal: Negative.   Genitourinary: Negative.   Musculoskeletal: Positive for myalgias.  Skin: Negative.   Neurological: Negative.   Endo/Heme/Allergies: Bruises/bleeds easily.  Psychiatric/Behavioral: Negative.     Past Medical History:  Diagnosis Date  . A-fib ([place])   . Anemia   . Anxiety   . Asthma   . CAD (coronary artery disease)    a. 2002 CABGx2 (LIMA->LAD, VG->VG->OM1);  b. 09/2012 DES->OM;  c. 03/2015 PTCA of LAD [facility]) in setting of atretic LIMA; d. 05/2015 Cath [facility]): nonobs dzs; e. 06/2015 Cath ([facility]): LM nl, LAD 45p/d ISR, 50d, D1/2 small, LCX 50p/d ISR, OM1 70ost, 30 ISR, VG->OM1 50ost, 48m LIMA->LAD 99p/d - atretic, RCA dom, nl; f.cath 10/16: 40-50%(FFR 0.90) pLAD, 75% (FFR 0.77) mLAD s/p PCI/DES, oRCA 40% (FFR0.95)  . Celiac disease   . Chronic diastolic CHF (congestive heart failure) ([place]    a. 06/2009 Echo: EF 60-65%, Gr 1 DD, triv AI, mildly dil LA, nl RV.  .Marland KitchenCOPD (chronic obstructive pulmonary disease) ([place]    a. Chronic bronchitis and emphysema.  .Marland KitchenDysrhythmia   . Essential hypertension   . History of tobacco abuse    a. Quit 2014.  .Marland KitchenPSVT (paroxysmal supraventricular tachycardia) ([place]    a. 10/2012 Noted on Zio Patch.  . Type II diabetes mellitus ([place]     Family History  Problem Relation Age of Onset  . Heart attack Mother   . Depression Mother   .  Heart disease Mother   . COPD Mother   . Hypertension Mother   . Heart attack Father   . Diabetes Father   . Depression Father   . Heart disease Father   . Cirrhosis Father   . Parkinson's disease Brother     Social History   Social History  . Marital status: Married    Spouse name: N/A  . Number of children: N/A  . Years of education: N/A   Occupational History  . Disabled    Social History Main  Topics  . Smoking status: Former Smoker    Quit date: 04/22/2013  . Smokeless tobacco: Never Used  . Alcohol use No     Comment: remotely quit alcohol use. Hx of heavy alcohol use.  . Drug use: No  . Sexual activity: Not on file   Other Topics Concern  . Not on file   Social History Narrative   Pt lives in [place] with wife.  Does not routinely exercise.    Past Surgical History:  Procedure Laterality Date  . BYPASS GRAFT    . CARDIAC CATHETERIZATION N/A 07/12/2015   rocedure: Left Heart Cath and Cors/Grafts Angiography;  Surgeon: Belva Crome, MD;  Location: [place] [facility];  Service: Cardiovascular;  Laterality: N/A;  . CARDIAC CATHETERIZATION Right 10/07/2015   Procedure: Left Heart Cath and Cors/Grafts Angiography;  Surgeon: Dionisio David, MD;  Location: [place] [facility];  Service: Cardiovascular;  Laterality: Right;  . CARDIAC CATHETERIZATION N/A 04/06/2016   Procedure: Left Heart Cath and Coronary Angiography;  Surgeon: Yolonda Kida, MD;  Location: [place] [facility];  Service: Cardiovascular;  Laterality: N/A;  . CARDIAC CATHETERIZATION  04/06/2016   Procedure: Bypass Graft Angiography;  Surgeon: Yolonda Kida, MD;  Location: [place] [facility];  Service: Cardiovascular;;  . CHOLECYSTECTOMY    . ESOPHAGEAL DILATION    . TONSILLECTOMY    . VASCULAR SURGERY       Prescriptions Prior to Admission  Medication Sig Dispense Refill Last Dose  . albuterol (PROVENTIL HFA;VENTOLIN HFA) 108 (90 Base) MCG/ACT inhaler Inhale 4-6 puffs into the lungs every 4 (four) hours as needed for wheezing or shortness of breath. Reported on 04/08/2016   prn at prn  . allopurinol (ZYLOPRIM) 100 MG tablet THREE (3) TABLETS TWICE DAILY 90 tablet 12 06/24/2016 at 0600  . ALPRAZolam (XANAX) 1 MG tablet Take 0.5-1 tablets (0.5-1 mg total) by mouth 3 (three) times daily as needed for anxiety. 90 tablet 3 06/24/2016 at 0600  . amLODipine (NORVASC) 10 MG tablet Take 10 mg by mouth daily.    06/24/2016 at 0600  . apixaban (ELIQUIS) 5 MG TABS tablet Take 5 mg by mouth 2 (two) times daily.   06/24/2016 at 0600  . atorvastatin (LIPITOR) 80 MG tablet TAKE 1 TABLET BY MOUTH EVERY NIGHT AT BEDTIME. 90 tablet 4 06/23/2016 at Unknown time  . cetirizine (ZYRTEC) 10 MG tablet TAKE ONE TABLET AT BEDTIME 30 tablet 12 06/24/2016 at 0600  . clopidogrel (PLAVIX) 75 MG tablet Take 1 tablet (75 mg total) by mouth daily. 30 tablet 5 06/24/2016 at 0600  . cyclobenzaprine (FLEXERIL) 10 MG tablet Take 1 tablet (10 mg total) by mouth every 8 (eight) hours as needed for muscle spasms. 30 tablet 5 06/24/2016 at 0600  . fluticasone furoate-vilanterol (BREO ELLIPTA) 100-25 MCG/INH AEPB Inhale 1 puff into the lungs daily.    06/24/2016 at 0600  . furosemide (LASIX) 80 MG tablet Take 80 mg  by mouth 2 (two) times daily.   06/24/2016 at 0600  . isosorbide mononitrate (IMDUR) 60 MG 24 hr tablet Take 120 mg by mouth daily. Reported on 04/24/2016   06/24/2016 at 0600  . lansoprazole (PREVACID) 30 MG capsule Take 30 mg by mouth daily.    06/24/2016 at 0600  . lisinopril (PRINIVIL,ZESTRIL) 5 MG tablet Take 5 mg by mouth daily.   06/24/2016 at 0600  . magnesium oxide (MAG-OX) 400 (241.3 Mg) MG tablet Take 400 mg by mouth daily.   06/24/2016 at 0600  . metFORMIN (GLUCOPHAGE) 500 MG tablet Take 500 mg by mouth daily with breakfast.    06/24/2016 at 0600  . metoprolol tartrate (LOPRESSOR) 25 MG tablet Take 50 mg by mouth 2 (two) times daily.    06/24/2016 at 0600  . nitroGLYCERIN (NITROSTAT) 0.4 MG SL tablet Place 0.4 mg under the tongue every 5 (five) minutes as needed for chest pain. Reported on 05/26/2016   prn at prn  . omega-3 acid ethyl esters (LOVAZA) 1 g capsule Take 4 g by mouth daily.   06/24/2016 at 0600  . oxyCODONE-acetaminophen (PERCOCET) 10-325 MG tablet Take 1 tablet by mouth every 4 (four) hours as needed for pain. 150 tablet 0 06/24/2016 at 0600  . RANEXA 500 MG 12 hr tablet TAKE 2 TABLETS BY MOUTH TWICE A DAY. 120 tablet 1 06/24/2016 at 0600   . sotalol (BETAPACE) 120 MG tablet Take 120 mg by mouth 2 (two) times daily.    06/24/2016 at 0600  . tamsulosin (FLOMAX) 0.4 MG CAPS capsule Take 0.4 mg by mouth daily after breakfast.    06/24/2016 at 0600  . tiotropium (SPIRIVA) 18 MCG inhalation capsule Place 18 mcg into inhaler and inhale daily.   06/24/2016 at 0600  . dicyclomine (BENTYL) 20 MG tablet Take 1 tablet (20 mg total) by mouth 3 (three) times daily as needed (abdominal pain). (Patient not taking: Reported on 06/24/2016) 30 tablet 0 Not Taking at Unknown time  . ondansetron (ZOFRAN) 4 MG tablet Take 1 tablet (4 mg total) by mouth every 8 (eight) hours as needed for nausea or vomiting. (Patient not taking: Reported on 06/24/2016) 20 tablet 0 Not Taking at Unknown time  . sucralfate (CARAFATE) 1 g tablet Take 1 tablet (1 g total) by mouth 2 (two) times daily. (Patient not taking: Reported on 06/24/2016) 20 tablet 0 Not Taking at Unknown time    Physical Exam: Blood pressure 126/74, pulse (!) 51, temperature 98.1 F (36.7 C), temperature source Oral, resp. rate 16, height 5' 7"  (1.702 m), weight 110.7 kg (244 lb), SpO2 95 %.   Wt Readings from Last 1 Encounters:  06/25/16 110.7 kg (244 lb)     General appearance: alert and cooperative Resp: clear to auscultation bilaterally Chest wall: right sided chest wall tenderness, left sided chest wall tenderness, right sided costochondral tenderness, left sided costochondral tenderness Cardio: regular rate and rhythm GI: abnormal findings:  moderate tenderness in the RUQ and in the LUQ Extremities: extremities normal, atraumatic, no cyanosis or edema Pulses: 2+ and symmetric Neurologic: Grossly normal  Labs:   Lab Results  Component Value Date   WBC 5.3 06/25/2016   HGB 11.4 (L) 06/25/2016   HCT 36.0 (L) 06/25/2016   MCV 77.8 (L) 06/25/2016   PLT 223 06/25/2016    Recent Labs Lab 06/24/16 1927 06/25/16 0541  NA 140 139  K 3.9 4.3  CL 107 105  CO2 29 31  BUN 12 13  CREATININE 0.89  1.05  CALCIUM 9.0 8.7*  PROT 6.9  --   BILITOT 0.6  --   ALKPHOS 82  --   ALT 16*  --   AST 19  --   GLUCOSE 127* 112*   Lab Results  Component Value Date   CKTOTAL 93 05/23/2014   CKMB 1.7 05/23/2014   TROPONINI <0.03 06/25/2016      Radiology: No acute abnormality post CABG. CT of the abdomen revealed chronic and ancillary findings no acute process. EKG: normal sinus rhythm with no ischemia   ASSESSMENT AND PLAN:  60 year old male with history of coronary artery disease status post coronary artery bypass grafting 2 2002 with LIMA to the LAD and a saphenous vein graft to the OM. He has had PCI of the saphenous vein graft to the OM as well as to the LAD LIMA anastomotic site. His most recent heart cath was recently in May of this year post PCI. Stents are patent. There is no significant progression of his native disease. He does have an anastomotic lesion of the saphenous vein inserts into the OM however this is not amenable to PCI. Pain is atypical for angina and he is ruled out for myocardial infarction. Pain is reproducible with deep palpation as well as with deep breathing and coughing. Would not proceed with invasive evaluation at this time. We'll continue with aspirin and Plavix. There is no obvious signs of bleeding. Continue with metoprolol, Ranexa, long-acting nitrates.  Signed: Teodoro Spray MD, [facility] 06/25/2016, 10:32 AM

## 2016-06-25 NOTE — Progress Notes (Signed)
[facility] [place] NAME: Lawrence [name]    MR#:  [id number]  DATE OF BIRTH:  [birth year]  SUBJECTIVE:admitted for  Chest pain,epigastric pain,.still  Has epigastric pan nausea,  CHIEF COMPLAINT:   Chief Complaint  Patient presents with  . Chest Pain  . Abdominal Pain    REVIEW OF SYSTEMS:   ROS CONSTITUTIONAL: No fever, fatigue or weakness.  EYES: No blurred or double vision.  EARS, NOSE, AND THROAT: No tinnitus or ear pain.  RESPIRATORY: No cough, shortness of breath, wheezing or hemoptysis.  CARDIOVASCULAR: No chest pain, orthopnea, edema.  GASTROINTESTINAL: nausea,epigastric pain., GENITOURINARY: No dysuria, hematuria.  ENDOCRINE: No polyuria, nocturia,  HEMATOLOGY: No anemia, easy bruising or bleeding SKIN: No rash or lesion. MUSCULOSKELETAL: No joint pain or arthritis.   NEUROLOGIC: No tingling, numbness, weakness.  PSYCHIATRY: No anxiety or depression.   DRUG ALLERGIES:   Allergies  Allergen Reactions  . Demerol [Meperidine] Hives  . Prednisone Other (See Comments) and Hypertension    Pt states that this medication puts him in A-fib   . Sulfa Antibiotics Hives  . Albuterol Sulfate [Albuterol] Palpitations and Other (See Comments)    Pt currently uses this medication.    . Morphine Sulfate Nausea And Vomiting, Rash and Other (See Comments)    Pt states that he is only allergic to the tablet form of this medication.      VITALS:  Blood pressure 121/67, pulse 65, temperature 98.1 F (36.7 C), temperature source Oral, resp. rate 18, height 5' 7"  (1.702 m), weight 110.7 kg (244 lb), SpO2 95 %.  PHYSICAL EXAMINATION:  GENERAL:  60 y.o.-year-old patient lying in the bed with no acute distress.  EYES: Pupils equal, round, reactive to light and accommodation. No scleral icterus. Extraocular muscles intact.  HEENT: Head atraumatic, normocephalic. Oropharynx and nasopharynx clear.  NECK:  Supple, no jugular venous  distention. No thyroid enlargement, no tenderness.  LUNGS: Normal breath sounds bilaterally, no wheezing, rales,rhonchi or crepitation. No use of accessory muscles of respiration.  CARDIOVASCULAR: S1, S2 normal. No murmurs, rubs, or gallops.  ABDOMEN: mild mid epigastric tenderness present, EXTREMITIES: No pedal edema, cyanosis, or clubbing.  NEUROLOGIC: Cranial nerves II through XII are intact. Muscle strength 5/5 in all extremities. Sensation intact. Gait not checked.  PSYCHIATRIC: The patient is alert and oriented x 3.  SKIN: No obvious rash, lesion, or ulcer.    LABORATORY PANEL:   CBC  Recent Labs Lab 06/25/16 0541  WBC 5.3  HGB 11.4*  HCT 36.0*  PLT 223   ------------------------------------------------------------------------------------------------------------------  Chemistries   Recent Labs Lab 06/24/16 1927 06/25/16 0541  NA 140 139  K 3.9 4.3  CL 107 105  CO2 29 31  GLUCOSE 127* 112*  BUN 12 13  CREATININE 0.89 1.05  CALCIUM 9.0 8.7*  AST 19  --   ALT 16*  --   ALKPHOS 82  --   BILITOT 0.6  --    ------------------------------------------------------------------------------------------------------------------  Cardiac Enzymes  Recent Labs Lab 06/25/16 1218  TROPONINI <0.03   ------------------------------------------------------------------------------------------------------------------  RADIOLOGY:  Dg Chest 2 View  Result Date: 06/24/2016 CLINICAL DATA:  Pt presents to [facility] via [facility] with c/o left sided pressure chest pain and URQ/ULQ pain, associated with nausea/vomiting for last 2 days. EXAM: CHEST - 2 VIEW COMPARISON:  04/03/2016 FINDINGS: Previous median sternotomy and CABG. Heart size upper limits normal. There is some linear scarring in the left mid and lower lung as before. Right lung  clear. No new infiltrate or overt edema. No effusion or pneumothorax. Anterior vertebral endplate spurring at multiple levels in the mid and lower thoracic  spine. IMPRESSION: 1. No acute abnormality post CABG. Electronically Signed   By: Lucrezia Europe M.D.   On: 06/24/2016 20:02   Ct Abdomen Pelvis W Contrast  Result Date: 06/24/2016 CLINICAL DATA:  Pt presents to [facility] via [facility] with c/o left sided pressure chest pain and RUQ/LUQ pain, associated with nausea/vomiting for last 2 days. EXAM: CT ABDOMEN AND PELVIS WITH CONTRAST TECHNIQUE: Multidetector CT imaging of the abdomen and pelvis was performed using the standard protocol following bolus administration of intravenous contrast. CONTRAST:  132m ISOVUE-300 IOPAMIDOL (ISOVUE-300) INJECTION 61% COMPARISON:  04/22/2016 and prior studies FINDINGS: Lower chest:  No acute findings. Hepatobiliary: No masses or other significant abnormality. Cholecystectomy clips. Pancreas: No mass, inflammatory changes, or other significant abnormality. Spleen: Within normal limits in size and appearance. Adrenals/Urinary Tract: 12 mm right and 14 mm left adrenal nodules , present since scans dating back to 2012. No hydronephrosis. No urolithiasis. Urinary bladder is nondistended, appearing diffusely thick walled. Stomach/Bowel: Small duodenal diverticulum. Stomach and small bowel are nondilated. Normal appendix. Scattered sigmoid diverticula without adjacent inflammatory/edematous change. Colon is nondilated. Vascular/Lymphatic: Scattered aortoiliac arterial calcifications without aneurysm or stenosis. Portal vein patent. No adenopathy localized. Reproductive: Mild prostatic enlargement. Other: No ascites.  No free air. Musculoskeletal: Mild spondylitic changes in the lower thoracic spine. IMPRESSION: 1. No acute abdominal process. 2. Chronic and ancillary findings as above Electronically Signed   By: DLucrezia EuropeM.D.   On: 06/24/2016 21:04    EKG:   Orders placed or performed during the hospital encounter of 06/24/16  . EKG 12-Lead  . EKG 12-Lead  . ED EKG within 10 minutes  . ED EKG within 10 minutes    ASSESSMENT AND PLAN:    1.chest pain and chronic stable  angina;troponins are negative, Seen by cardio.advised  To  continue  Present meds with bblocker,ranexa,statins reduced dose of bblokcer due to bradycardia;i had to stop sotalol and decrease metoprolol. 2.epigastri pain ,nausea due to acute gastritis;continue PPI,zofran,add carafate  3,chronic afib;on  eliquis for AC. 4.copd;no wheezing   All the records are reviewed and case discussed with Care Management/Social Workerr. Management plans discussed with the patient, family and they are in agreement.  CODE STATUS: full  TOTAL TIME TAKING CARE OF THIS PATIENT:35 minutes.   POSSIBLE D/C IN 1-2DAYS, DEPENDING ON CLINICAL CONDITION.   KEpifanio LeschesM.D on 06/25/2016 at 11:04 PM  Between 7am to 6pm - Pager - [phone]  After 6pm go to [email or web address] - password [email or web address]  Office  [phone] CC: Primary care physician; DLelon Huh MD   Note: This dictation was prepared with Dragon dictation along with smaller phrase technology. Any transcriptional errors that result from this process are unintentional.

## 2016-06-25 NOTE — Care Management Obs Status (Signed)
[place] NOTIFICATION   Patient Details  Name: Lawrence [name] MRN: [id number] Date of Birth: [birth year]   Medicare Observation Status Notification Given:  Yes    Jolly Mango, RN 06/25/2016, 9:16 AM

## 2016-06-25 NOTE — Progress Notes (Signed)
Patient requesting miralax - ok to order per Dr. Vianne Bulls.

## 2016-06-25 NOTE — Plan of Care (Signed)
Problem: Pain Managment: Goal: General experience of comfort will improve Outcome: Progressing Reported pain addressed with PRN pain medication per pt request; reported relief.

## 2016-06-25 NOTE — Plan of Care (Signed)
Problem: Safety: Goal: Ability to remain free from injury will improve Outcome: Progressing Bed alarm on, patient agrees to call for assistance before getting out of bed.

## 2016-06-26 DIAGNOSIS — R112 Nausea with vomiting, unspecified: Secondary | ICD-10-CM | POA: Diagnosis not present

## 2016-06-26 DIAGNOSIS — J441 Chronic obstructive pulmonary disease with (acute) exacerbation: Secondary | ICD-10-CM | POA: Diagnosis not present

## 2016-06-26 DIAGNOSIS — F419 Anxiety disorder, unspecified: Secondary | ICD-10-CM | POA: Diagnosis not present

## 2016-06-26 DIAGNOSIS — I2510 Atherosclerotic heart disease of native coronary artery without angina pectoris: Secondary | ICD-10-CM | POA: Diagnosis not present

## 2016-06-26 DIAGNOSIS — E119 Type 2 diabetes mellitus without complications: Secondary | ICD-10-CM | POA: Diagnosis not present

## 2016-06-26 LAB — GLUCOSE, CAPILLARY: Glucose-Capillary: 118 mg/dL — ABNORMAL HIGH (ref 65–99)

## 2016-06-26 MED ORDER — OXYCODONE HCL 5 MG PO TABS: 5.0000 mg | ORAL_TABLET | Freq: Four times a day (QID) | ORAL | 0 refills | Status: DC | PRN

## 2016-06-26 MED ORDER — METOPROLOL TARTRATE 25 MG PO TABS: 12.5000 mg | ORAL_TABLET | Freq: Two times a day (BID) | ORAL | 0 refills | Status: DC

## 2016-06-26 NOTE — Discharge Summary (Signed)
Lawrence [name], is a 60 y.o. male  DOB 05/18/1956  MRN [id number].  Admission date:  06/24/2016  Admitting Physician  Lance Coon, MD  Discharge Date:  06/26/2016   Primary MD  [name], MD  Recommendations for primary care physician for things to follow:  Follow-up with Dr. Ubaldo Glassing in 1 week  Admission Diagnosis  Left lower quadrant pain [R10.32] COPD exacerbation ([place]) [J44.1] Chest pain, unspecified chest pain type [R07.9]   Discharge Diagnosis  Left lower quadrant pain [R10.32] COPD exacerbation ([place]) [J44.1] Chest pain, unspecified chest pain type [R07.9]    Principal Problem:   Angina pectoris ([place]) Active Problems:   CAD (coronary artery disease)   HTN (hypertension)   DM (diabetes mellitus) ([place])   GERD (gastroesophageal reflux disease)   HLD (hyperlipidemia)   COPD (chronic obstructive pulmonary disease) (HCC)   Chronic diastolic CHF (congestive heart failure) (HCC)   PAF (paroxysmal atrial fibrillation) ([place])   Anxiety      Past Medical History:  Diagnosis Date  . A-fib ([place])   . Anemia   . Anxiety   . Asthma   . CAD (coronary artery disease)    a. 2002 CABGx2 (LIMA->LAD, VG->VG->OM1);  b. 09/2012 DES->OM;  c. 03/2015 PTCA of LAD [facility]) in setting of atretic LIMA; d. 05/2015 Cath [facility]): nonobs dzs; e. 06/2015 Cath ([facility]): LM nl, LAD 45p/d ISR, 50d, D1/2 small, LCX 50p/d ISR, OM1 70ost, 30 ISR, VG->OM1 50ost, 6m LIMA->LAD 99p/d - atretic, RCA dom, nl; f.cath 10/16: 40-50%(FFR 0.90) pLAD, 75% (FFR 0.77) mLAD s/p PCI/DES, oRCA 40% (FFR0.95)  . Celiac disease   . Chronic diastolic CHF (congestive heart failure) ([place]    a. 06/2009 Echo: EF 60-65%, Gr 1 DD, triv AI, mildly dil LA, nl RV.  .Marland KitchenCOPD (chronic obstructive pulmonary disease) ([place]    a. Chronic bronchitis and emphysema.  .Marland KitchenDysrhythmia   .  Essential hypertension   . History of tobacco abuse    a. Quit 2014.  .Marland KitchenPSVT (paroxysmal supraventricular tachycardia) ([place]    a. 10/2012 Noted on Zio Patch.  . Type II diabetes mellitus ([place]     Past Surgical History:  Procedure Laterality Date  . BYPASS GRAFT    . CARDIAC CATHETERIZATION N/A 07/12/2015   rocedure: Left Heart Cath and Cors/Grafts Angiography;  Surgeon: HBelva Crome MD;  Location: [facility];  Service: Cardiovascular;  Laterality: N/A;  . CARDIAC CATHETERIZATION Right 10/07/2015   Procedure: Left Heart Cath and Cors/Grafts Angiography;  Surgeon: SDionisio David MD;  [facility];  Service: Cardiovascular;  Laterality: Right;  . CARDIAC CATHETERIZATION N/A 04/06/2016   Procedure: Left Heart Cath and Coronary Angiography;  Surgeon: DYolonda Kida MD;  [facility];  Service: Cardiovascular;  Laterality: N/A;  . CARDIAC CATHETERIZATION  04/06/2016   Procedure: Bypass Graft Angiography;  Surgeon: DYolonda Kida MD;  Location: [facility];  Service: Cardiovascular;;  . CHOLECYSTECTOMY    . ESOPHAGEAL DILATION    . TONSILLECTOMY    . VASCULAR SURGERY         History of present illness and  Hospital Course:     Kindly see H&P for history of present illness and admission details, please review complete Labs, Consult reports and Test reports for all details in brief  HPI  from the history and physical done on the day of admission89year old male patient admitted because of fall chest pain, epigastric pain, nausea, vomiting. Because  of her history of coronary artery disease with bypass surgery, stent admitted to telemetry for evaluation of chest pain.   Hospital Course  #1 chest pain secondary to acute gastritis: Troponins are negative 3. Seen by cardiology. No further cardiac workup is  Suggested by cardio. Already on Imdur, ACE inhibitor's, Ranexa, beta blockers. #2 bradycardia heart rate is a in low 40s. His  continued the sotalol because of bradycardia. I have decreased the metoprolol to 12.5 mg by mouth twice a day. Patient needs follow-up with cardiology as an outpatient to restart the medication as appropriate. #3 epigastric pain, nausea, vomiting with acute gastritis: Abdominal CAT scan unremarkable. Patient received IV PPIs, Zofran. He is tolerating the diet. Patient advised to keep the appointment with gastroenterology patient supposed to get EGD next month by  [facility].Marland Kitchen   And continue PPIs. As per cardiology note patient has anemia and the EGD is planned to see if his symptoms of angina are related to GI bleed. For narcotic seeking behavior: Patient takes 10/325 at home. Patient can be discharged home, follow with primary doctor for further narcotic prescription.  #4 history of CAD ; that is, Imdur. Continue that. They are pressure medications include Plavix, Axis I. #5 history of atrial fibrillation:  Bradycardic here,.adjusted bbblocker dose at discharge.continue EPixiban  Discharge Condition:stable   Follow UP  Follow-up Information    [name], MD Follow up in 1 week(s).   Specialty:  Family Medicine Contact information: [address] [place] [address] [phone]             Discharge Instructions  and  Discharge Medications        Medication List    STOP taking these medications   oxyCODONE-acetaminophen 10-325 MG tablet Commonly known as:  PERCOCET   sotalol 120 MG tablet Commonly known as:  BETAPACE     TAKE these medications   albuterol 108 (90 Base) MCG/ACT inhaler Commonly known as:  PROVENTIL HFA;VENTOLIN HFA Inhale 4-6 puffs into the lungs every 4 (four) hours as needed for wheezing or shortness of breath. Reported on 04/08/2016   allopurinol 100 MG tablet Commonly known as:  ZYLOPRIM THREE (3) TABLETS TWICE DAILY   ALPRAZolam 1 MG tablet Commonly known as:  XANAX Take 0.5-1 tablets (0.5-1 mg total) by mouth 3 (three) times  daily as needed for anxiety.   amLODipine 10 MG tablet Commonly known as:  NORVASC Take 10 mg by mouth daily.   atorvastatin 80 MG tablet Commonly known as:  LIPITOR TAKE 1 TABLET BY MOUTH EVERY NIGHT AT BEDTIME.   BREO ELLIPTA 100-25 MCG/INH Aepb Generic drug:  fluticasone furoate-vilanterol Inhale 1 puff into the lungs daily.   cetirizine 10 MG tablet Commonly known as:  ZYRTEC TAKE ONE TABLET AT BEDTIME   clopidogrel 75 MG tablet Commonly known as:  PLAVIX Take 1 tablet (75 mg total) by mouth daily.   cyclobenzaprine 10 MG tablet Commonly known as:  FLEXERIL Take 1 tablet (10 mg total) by mouth every 8 (eight) hours as needed for muscle spasms.   dicyclomine 20 MG tablet Commonly known as:  BENTYL Take 1 tablet (20 mg total) by mouth 3 (three) times daily as needed (abdominal pain).   ELIQUIS 5 MG Tabs tablet Generic drug:  apixaban Take 5 mg by mouth 2 (two) times daily.   furosemide 80 MG tablet Commonly known as:  LASIX Take 80 mg by mouth 2 (two) times daily.   isosorbide mononitrate 60 MG 24 hr  tablet Commonly known as:  IMDUR Take 120 mg by mouth daily. Reported on 04/24/2016   lansoprazole 30 MG capsule Commonly known as:  PREVACID Take 30 mg by mouth daily.   lisinopril 5 MG tablet Commonly known as:  PRINIVIL,ZESTRIL Take 5 mg by mouth daily.   LOVAZA 1 g capsule Generic drug:  omega-3 acid ethyl esters Take 4 g by mouth daily.   magnesium oxide 400 (241.3 Mg) MG tablet Commonly known as:  MAG-OX Take 400 mg by mouth daily.   metFORMIN 500 MG tablet Commonly known as:  GLUCOPHAGE Take 500 mg by mouth daily with breakfast.   metoprolol tartrate 25 MG tablet Commonly known as:  LOPRESSOR Take 0.5 tablets (12.5 mg total) by mouth 2 (two) times daily. What changed:  how much to take   nitroGLYCERIN 0.4 MG SL tablet Commonly known as:  NITROSTAT Place 0.4 mg under the tongue every 5 (five) minutes as needed for chest pain. Reported on  05/26/2016   ondansetron 4 MG tablet Commonly known as:  ZOFRAN Take 1 tablet (4 mg total) by mouth every 8 (eight) hours as needed for nausea or vomiting.   oxyCODONE 5 MG immediate release tablet Commonly known as:  Oxy IR/ROXICODONE Take 1 tablet (5 mg total) by mouth every 6 (six) hours as needed for severe pain.   RANEXA 500 MG 12 hr tablet Generic drug:  ranolazine TAKE 2 TABLETS BY MOUTH TWICE A DAY.   sucralfate 1 g tablet Commonly known as:  CARAFATE Take 1 tablet (1 g total) by mouth 2 (two) times daily.   tamsulosin 0.4 MG Caps capsule Commonly known as:  FLOMAX Take 0.4 mg by mouth daily after breakfast.   tiotropium 18 MCG inhalation capsule Commonly known as:  SPIRIVA Place 18 mcg into inhaler and inhale daily.         Diet and Activity recommendation: See Discharge Instructions above   Consults obtained - cardio   Major procedures and Radiology Reports - PLEASE review detailed and final reports for all details, in brief -     Dg Chest 2 View  Result Date: 06/24/2016 CLINICAL DATA:  Pt presents to Ed via [facility] with c/o left sided pressure chest pain and URQ/ULQ pain, associated with nausea/vomiting for last 2 days. EXAM: CHEST - 2 VIEW COMPARISON:  04/03/2016 FINDINGS: Previous median sternotomy and CABG. Heart size upper limits normal. There is some linear scarring in the left mid and lower lung as before. Right lung clear. No new infiltrate or overt edema. No effusion or pneumothorax. Anterior vertebral endplate spurring at multiple levels in the mid and lower thoracic spine. IMPRESSION: 1. No acute abnormality post CABG. Electronically Signed   By: Lucrezia Europe M.D.   On: 06/24/2016 20:02   Ct Abdomen Pelvis W Contrast  Result Date: 06/24/2016 CLINICAL DATA:  Pt presents to Ed via [facility] with c/o left sided pressure chest pain and RUQ/LUQ pain, associated with nausea/vomiting for last 2 days. EXAM: CT ABDOMEN AND PELVIS WITH CONTRAST TECHNIQUE: Multidetector CT  imaging of the abdomen and pelvis was performed using the standard protocol following bolus administration of intravenous contrast. CONTRAST:  137m ISOVUE-300 IOPAMIDOL (ISOVUE-300) INJECTION 61% COMPARISON:  04/22/2016 and prior studies FINDINGS: Lower chest:  No acute findings. Hepatobiliary: No masses or other significant abnormality. Cholecystectomy clips. Pancreas: No mass, inflammatory changes, or other significant abnormality. Spleen: Within normal limits in size and appearance. Adrenals/Urinary Tract: 12 mm right and 14 mm left adrenal nodules , present since  scans dating back to 2012. No hydronephrosis. No urolithiasis. Urinary bladder is nondistended, appearing diffusely thick walled. Stomach/Bowel: Small duodenal diverticulum. Stomach and small bowel are nondilated. Normal appendix. Scattered sigmoid diverticula without adjacent inflammatory/edematous change. Colon is nondilated. Vascular/Lymphatic: Scattered aortoiliac arterial calcifications without aneurysm or stenosis. Portal vein patent. No adenopathy localized. Reproductive: Mild prostatic enlargement. Other: No ascites.  No free air. Musculoskeletal: Mild spondylitic changes in the lower thoracic spine. IMPRESSION: 1. No acute abdominal process. 2. Chronic and ancillary findings as above Electronically Signed   By: Lucrezia Europe M.D.   On: 06/24/2016 21:04    Micro Results    No results found for this or any previous visit (from the past 240 hour(s)).     Today   Subjective:   Lawrence [name] today has no headache,no chest abdominal pain,no new weakness tingling or numbness, feels much better wants to go home today.   Objective:   Blood pressure 119/70, pulse (!) 58, temperature 98.1 F (36.7 C), resp. rate 18, height 5' 7"  (1.702 m), weight 109.5 kg (241 lb 6.4 oz), SpO2 92 %.   Intake/Output Summary (Last 24 hours) at 06/26/16 0953 Last data filed at 06/26/16 0931  Gross per 24 hour  Intake                0 ml  Output              1950 ml  Net            -1950 ml    Exam Awake Alert, Oriented x 3, No new F.N deficits, Normal affect [place].AT,PERRAL Supple Neck,No JVD, No cervical lymphadenopathy appriciated.  Symmetrical Chest wall movement, Good air movement bilaterally, CTAB RRR,No Gallops,Rubs or new Murmurs, No Parasternal Heave +ve B.Sounds, Abd Soft, Non tender, No organomegaly appriciated, No rebound -guarding or rigidity. No Cyanosis, Clubbing or edema, No new Rash or bruise  Data Review   CBC w Diff: Lab Results  Component Value Date   WBC 5.3 06/25/2016   HGB 11.4 (L) 06/25/2016   HGB 16.4 03/07/2015   HCT 36.0 (L) 06/25/2016   HCT 34.5 (L) 04/15/2016   PLT 223 06/25/2016   PLT 297 04/15/2016   LYMPHOPCT 24 03/18/2016   LYMPHOPCT 24.2 03/07/2015   MONOPCT 11 03/18/2016   MONOPCT 7.1 03/07/2015   EOSPCT 5 03/18/2016   EOSPCT 4.8 03/07/2015   BASOPCT 1 03/18/2016   BASOPCT 0.7 03/07/2015    CMP: Lab Results  Component Value Date   NA 139 06/25/2016   NA 143 03/07/2015   K 4.3 06/25/2016   K 3.9 03/07/2015   CL 105 06/25/2016   CL 107 03/07/2015   CO2 31 06/25/2016   CO2 29 03/07/2015   BUN 13 06/25/2016   BUN 9 03/07/2015   CREATININE 1.05 06/25/2016   CREATININE 0.84 03/07/2015   PROT 6.9 06/24/2016   PROT 6.8 03/07/2015   ALBUMIN 3.7 06/24/2016   ALBUMIN 3.8 03/07/2015   BILITOT 0.6 06/24/2016   BILITOT 0.6 03/07/2015   ALKPHOS 82 06/24/2016   ALKPHOS 61 03/07/2015   AST 19 06/24/2016   AST 25 03/07/2015   ALT 16 (L) 06/24/2016   ALT 25 03/07/2015  .   Total Time in preparing paper work, data evaluation and todays exam - 64 minutes  Ema Hebner M.D on 06/26/2016 at 9:53 AM    Note: This dictation was prepared with Dragon dictation along with smaller phrase technology. Any transcriptional errors that result from this process are  unintentional.

## 2016-06-26 NOTE — Progress Notes (Signed)
Pt. Discharged to home via wc. Discharge instructions and medication regimen reviewed at bedside with patient. Pt. verbalizes understanding of instructions and medication regimen. Prescriptions included with d/c papers, with one waiting at pharmacy, pt aware. Pt home meds returned and discharged with him. Patient assessment unchanged from this morning. TELE and IV discontinued per policy.

## 2016-06-26 NOTE — Progress Notes (Signed)
Pt continues to be sinus brady, high 50s. Dr. Vianne Bulls aware, orders to hold metoprolol this AM. Will cont. To monitor.

## 2016-06-26 NOTE — Progress Notes (Signed)
Pt home meds returned to him from pharmacy. Paper work signed and distributed according to protocol.

## 2016-06-26 NOTE — Progress Notes (Signed)
Due to patient having mult falls at home, asked Dr. Vianne Bulls if pt can be d/c'd with home health PT. MD agreed and placed orders. Will notify case management.

## 2016-06-26 NOTE — Care Management (Signed)
Spoke with patient. He drives and is independent eith adls. He states he and his wife are currently staying in a motel because they have been evicted. He has food and his wife is working.  No home health needs at this time.

## 2016-06-29 ENCOUNTER — Other Ambulatory Visit: Payer: Self-pay | Admitting: *Deleted

## 2016-06-29 NOTE — Patient Outreach (Signed)
Transition of care call completed (follow up on recent hospitalization 8/9-8/11 COPD exacerbation, chest pain, LLQ pain).  Spoke with pt, HIPAA verified- per pt continues to stay at [facility], mailing address is [address] [place] [address].   Pt reports since being home, spouse had a fall, unable to help, out of work until later this week, grandson helping him.    Pt reports since discharge, has had a  little chest pain, not enough to follow up on, did not take Nitroglycerin SL.  Pt reports need to call Dr. [name], make a follow up appointment, scheduled to see Dr.  [name] next week.  Pt reports he also needs to f/u with Dr. Humphrey Rolls (take another breathing test), will schedule once he gets transportation arranged. Pt  Reports his car is broken down, plans to call [place] transportation back.  Pt reports he has all of his medications except for Oxycodone 5 mg, did not have prescription filled because was given the medication in the hospital prior to discharge, got home and  Threw up.  Pt reports he knows it came from the Oxycodone because he had the same results when Dr. [name] gave it to him in the past.   Pt reports he is back to taking Percocet, will let Dr. [name] know when he sees him next week.  RN CM discussed with pt calling Dr. [name] earlier than next week to let him know not taking the Oxycodone, back to taking Percocet as per discharge summary told to stop the Percocet and start the Oxycodone.  Pt voiced understanding, agreed to call Dr. [name].   RN CM discussed with pt plan to follow for transition of care for next 31 days (weekly phone calls, a home visit).    Patient was recently discharged from hospital and all medications have been reviewed.  Plan:  As discussed with pt, plan to follow up again next week- home visit, part of ongoing transition of care.    Zara Chess.   [place] [facility]  [phone]

## 2016-07-07 ENCOUNTER — Inpatient Hospital Stay: Payer: Commercial Managed Care - HMO | Admitting: Family Medicine

## 2016-07-08 ENCOUNTER — Other Ambulatory Visit: Payer: Self-pay | Admitting: *Deleted

## 2016-07-08 ENCOUNTER — Encounter: Payer: Self-pay | Admitting: *Deleted

## 2016-07-08 NOTE — Patient Outreach (Addendum)
[facility]) Care Management   07/08/2016  Lawrence [name] [birth year] [id number]  HORRIS SPEROS is an 60 y.o. male  Subjective:  Pt reports was suppose to see Heart MD yesterday but appointment was changed to 9/14 so he could see Dr. [name].   Pt reports he was unable to keep appointment with Dr. [name] because of no transportation,car not working. Pt reports pastor knows someone who can replace  the clutch on his car.   Pt reports he misplaced number for [facility] transportation.   Pt reports he is on the list (#2) to get an apartment at [facility], to remain in hotel until then.  Pt  Reports needs refills on Flexeril and Bentyl, to call Heart MD office about this.     Objective:   Vitals:   07/08/16 1032  BP: 110/60  Pulse: 62  Resp: 20    ROS  Physical Exam  Constitutional: He is oriented to person, place, and time. He appears well-developed and well-nourished.  Cardiovascular: Normal rate, regular rhythm and normal heart sounds.   Respiratory: Effort normal and breath sounds normal.  Continuous O2 2 L [place]   GI: Soft. Bowel sounds are normal. He exhibits distension.  Normal distention due to weight.   Musculoskeletal: Normal range of motion. He exhibits no edema.  Neurological: He is alert and oriented to person, place, and time.  Skin: Skin is warm and dry.  Psychiatric: He has a normal mood and affect. His behavior is normal. Judgment and thought content normal.    Encounter Medications:   Outpatient Encounter Prescriptions as of 07/08/2016  Medication Sig Note  . albuterol (PROVENTIL HFA;VENTOLIN HFA) 108 (90 Base) MCG/ACT inhaler Inhale 4-6 puffs into the lungs every 4 (four) hours as needed for wheezing or shortness of breath. Reported on 04/08/2016 07/08/2016: As needed.   Marland Kitchen allopurinol (ZYLOPRIM) 100 MG tablet THREE (3) TABLETS TWICE DAILY   . ALPRAZolam (XANAX) 1 MG tablet Take 0.5-1 tablets (0.5-1 mg total) by mouth 3 (three) times daily as  needed for anxiety.   Marland Kitchen amLODipine (NORVASC) 10 MG tablet Take 10 mg by mouth daily.   Marland Kitchen apixaban (ELIQUIS) 5 MG TABS tablet Take 5 mg by mouth 2 (two) times daily.   Marland Kitchen atorvastatin (LIPITOR) 80 MG tablet TAKE 1 TABLET BY MOUTH EVERY NIGHT AT BEDTIME.   . cetirizine (ZYRTEC) 10 MG tablet TAKE ONE TABLET AT BEDTIME   . clopidogrel (PLAVIX) 75 MG tablet Take 1 tablet (75 mg total) by mouth daily.   . fluticasone furoate-vilanterol (BREO ELLIPTA) 100-25 MCG/INH AEPB Inhale 1 puff into the lungs daily.    . furosemide (LASIX) 80 MG tablet Take 80 mg by mouth 2 (two) times daily. 07/08/2016: Pt reports taking 1/2 tablet (total 40 mg) once a day   . isosorbide mononitrate (IMDUR) 60 MG 24 hr tablet Take 120 mg by mouth daily. Reported on 04/24/2016   . lansoprazole (PREVACID) 30 MG capsule Take 30 mg by mouth daily.    Marland Kitchen lisinopril (PRINIVIL,ZESTRIL) 5 MG tablet Take 5 mg by mouth daily.   . magnesium oxide (MAG-OX) 400 (241.3 Mg) MG tablet Take 400 mg by mouth daily.   . metFORMIN (GLUCOPHAGE) 500 MG tablet Take 500 mg by mouth daily with breakfast.    . metoprolol tartrate (LOPRESSOR) 25 MG tablet Take 0.5 tablets (12.5 mg total) by mouth 2 (two) times daily.   Marland Kitchen omega-3 acid ethyl esters (LOVAZA) 1 g capsule Take 4 g by mouth  daily.   . oxyCODONE-acetaminophen (PERCOCET) 10-325 MG tablet Take 1 tablet by mouth every 4 (four) hours as needed. Pt taking once a day   . RANEXA 500 MG 12 hr tablet TAKE 2 TABLETS BY MOUTH TWICE A DAY.   . sucralfate (CARAFATE) 1 g tablet Take 1 tablet (1 g total) by mouth 2 (two) times daily.   . tamsulosin (FLOMAX) 0.4 MG CAPS capsule Take 0.4 mg by mouth daily after breakfast.    . tiotropium (SPIRIVA) 18 MCG inhalation capsule Place 18 mcg into inhaler and inhale daily.   . cyclobenzaprine (FLEXERIL) 10 MG tablet Take 1 tablet (10 mg total) by mouth every 8 (eight) hours as needed for muscle spasms. (Patient not taking: Reported on 07/08/2016) 07/08/2016: Ran out, to ask MD  for refill rx.   . dicyclomine (BENTYL) 20 MG tablet Take 1 tablet (20 mg total) by mouth 3 (three) times daily as needed (abdominal pain). (Patient not taking: Reported on 06/24/2016)   . nitroGLYCERIN (NITROSTAT) 0.4 MG SL tablet Place 0.4 mg under the tongue every 5 (five) minutes as needed for chest pain. Reported on 05/26/2016 07/08/2016: Available if needed.   . ondansetron (ZOFRAN) 4 MG tablet Take 1 tablet (4 mg total) by mouth every 8 (eight) hours as needed for nausea or vomiting. (Patient not taking: Reported on 06/24/2016)   . oxyCODONE (OXY IR/ROXICODONE) 5 MG immediate release tablet Take 1 tablet (5 mg total) by mouth every 6 (six) hours as needed for severe pain. (Patient not taking: Reported on 06/29/2016) 06/29/2016: Per pt, unable to tolerate, had emesis shortly after taking.   Did not have prescription filled.    No facility-administered encounter medications on file as of 07/08/2016.     Functional Status:   In your present state of health, do you have any difficulty performing the following activities: 07/08/2016 06/25/2016  Hearing? - N  Vision? - N  Difficulty concentrating or making decisions? - N  Walking or climbing stairs? - Y  Dressing or bathing? - N  Doing errands, shopping? - N  Conservation officer, nature and eating ? N -  Using the Toilet? N -  Managing your Medications? N -  Managing your Finances? N -  Some recent data might be hidden    Fall/Depression Screening:    PHQ 2/9 Scores 04/16/2016 09/06/2015  PHQ - 2 Score 0 0  PHQ- 9 Score - 2    Assessment:  Pleasant 60 year old gentleman, currently staying  at [facility] with spouse, grand child- on waiting list for [employer],  Visit done in hotel breakfast room (per pt's request, spouse present during part of visit).     COPD- lungs clear, pt on continuous O2 2LNC- saturation at rest 97 %.  Per pt,              Stays sob,no change.   COPD action plan provided/reviewed with pt.        Plan:   Plan to  continue to follow pt for transition of care, follow up again telephonically next week.               As discussed, pt  to call [employer] [facility] transportation)-                 Number provided by RN CM, pt then to  call Dr. [name] to reschedule                 Office  visit.    [facility] CM Care Plan Problem One   Flowsheet Row Most Recent Value  Care Plan Problem One  (P) Risk for readmission related to recent hospitalization for COPD exacerbation, chest pain   Role Documenting the Problem One  (P) Care Management [place] for Problem One  (P) Active  [facility] Long Term Goal (31-90 days)  (P) Pt would not readmit in the next 31 days   [facility] Long Term Goal Start Date  (P) 06/29/16  Interventions for Problem One Long Term Goal  (P) Home visit done, provided/reviewed COPD action plan   [facility] CM Short Term Goal #1 (0-30 days)  (P) Pt would call Dr. [name] within next 10 days, report to MD unable to tolerate Oxydocone, back taking Percocet for pain   [facility] CM Short Term Goal #1 Start Date  (P) 06/29/16    [facility] CM Care Plan Problem Three   Flowsheet Row Most Recent Value  Care Plan Problem Three  (P) COPD- recent hospitalization   Role Documenting the Problem Three  (P) Care Management [place] for Problem Three  (P) Active     Zara Chess.   [place] [facility] Management  [phone]

## 2016-07-10 DIAGNOSIS — G4733 Obstructive sleep apnea (adult) (pediatric): Secondary | ICD-10-CM | POA: Diagnosis not present

## 2016-07-12 DIAGNOSIS — I10 Essential (primary) hypertension: Secondary | ICD-10-CM | POA: Diagnosis not present

## 2016-07-12 DIAGNOSIS — G4710 Hypersomnia, unspecified: Secondary | ICD-10-CM | POA: Diagnosis not present

## 2016-07-12 DIAGNOSIS — J439 Emphysema, unspecified: Secondary | ICD-10-CM | POA: Diagnosis not present

## 2016-07-12 DIAGNOSIS — J449 Chronic obstructive pulmonary disease, unspecified: Secondary | ICD-10-CM | POA: Diagnosis not present

## 2016-07-14 ENCOUNTER — Encounter: Payer: Self-pay | Admitting: *Deleted

## 2016-07-14 ENCOUNTER — Emergency Department: Payer: Commercial Managed Care - HMO

## 2016-07-14 ENCOUNTER — Emergency Department
Admission: EM | Admit: 2016-07-14 | Discharge: 2016-07-14 | Disposition: A | Discharge status: Home / Self Care | Payer: Commercial Managed Care - HMO | Attending: Emergency Medicine | Admitting: Emergency Medicine

## 2016-07-14 DIAGNOSIS — R1110 Vomiting, unspecified: Secondary | ICD-10-CM | POA: Diagnosis not present

## 2016-07-14 DIAGNOSIS — R079 Chest pain, unspecified: Secondary | ICD-10-CM | POA: Diagnosis not present

## 2016-07-14 DIAGNOSIS — Z7984 Long term (current) use of oral hypoglycemic drugs: Secondary | ICD-10-CM | POA: Diagnosis not present

## 2016-07-14 DIAGNOSIS — I130 Hypertensive heart and chronic kidney disease with heart failure and stage 1 through stage 4 chronic kidney disease, or unspecified chronic kidney disease: Secondary | ICD-10-CM | POA: Diagnosis not present

## 2016-07-14 DIAGNOSIS — N189 Chronic kidney disease, unspecified: Secondary | ICD-10-CM | POA: Diagnosis not present

## 2016-07-14 DIAGNOSIS — J45909 Unspecified asthma, uncomplicated: Secondary | ICD-10-CM | POA: Insufficient documentation

## 2016-07-14 DIAGNOSIS — R0789 Other chest pain: Secondary | ICD-10-CM | POA: Diagnosis not present

## 2016-07-14 DIAGNOSIS — J449 Chronic obstructive pulmonary disease, unspecified: Secondary | ICD-10-CM | POA: Insufficient documentation

## 2016-07-14 DIAGNOSIS — E1122 Type 2 diabetes mellitus with diabetic chronic kidney disease: Secondary | ICD-10-CM | POA: Insufficient documentation

## 2016-07-14 DIAGNOSIS — R1013 Epigastric pain: Secondary | ICD-10-CM | POA: Diagnosis not present

## 2016-07-14 DIAGNOSIS — Z87891 Personal history of nicotine dependence: Secondary | ICD-10-CM | POA: Insufficient documentation

## 2016-07-14 DIAGNOSIS — I5032 Chronic diastolic (congestive) heart failure: Secondary | ICD-10-CM | POA: Diagnosis not present

## 2016-07-14 DIAGNOSIS — I2510 Atherosclerotic heart disease of native coronary artery without angina pectoris: Secondary | ICD-10-CM | POA: Insufficient documentation

## 2016-07-14 DIAGNOSIS — R109 Unspecified abdominal pain: Secondary | ICD-10-CM | POA: Diagnosis present

## 2016-07-14 LAB — BASIC METABOLIC PANEL
Anion gap: 3 — ABNORMAL LOW (ref 5–15)
BUN: 12 mg/dL (ref 6–20)
CO2: 32 mmol/L (ref 22–32)
Calcium: 9.3 mg/dL (ref 8.9–10.3)
Chloride: 103 mmol/L (ref 101–111)
Creatinine, Ser: 1.01 mg/dL (ref 0.61–1.24)
GFR calc Af Amer: >60 mL/min (ref >60)
GFR calc non Af Amer: >60 mL/min (ref >60)
Glucose, Bld: 120 mg/dL — ABNORMAL HIGH (ref 65–99)
Potassium: 4.2 mmol/L (ref 3.5–5.1)
Sodium: 138 mmol/L (ref 135–145)

## 2016-07-14 LAB — HEPATIC FUNCTION PANEL
ALT: 20 U/L (ref 17–63)
AST: 23 U/L (ref 15–41)
Albumin: 3.8 g/dL (ref 3.5–5.0)
Alkaline Phosphatase: 80 U/L (ref 38–126)
Bilirubin, Direct: 0.1 mg/dL (ref 0.1–0.5)
Indirect Bilirubin: 0.3 mg/dL (ref 0.3–0.9)
Total Bilirubin: 0.4 mg/dL (ref 0.3–1.2)
Total Protein: 6.9 g/dL (ref 6.5–8.1)

## 2016-07-14 LAB — CBC
HCT: 38.5 % — ABNORMAL LOW (ref 40.0–52.0)
Hemoglobin: 12.6 g/dL — ABNORMAL LOW (ref 13.0–18.0)
MCH: 25 pg — ABNORMAL LOW (ref 26.0–34.0)
MCHC: 32.8 g/dL (ref 32.0–36.0)
MCV: 76.4 fL — ABNORMAL LOW (ref 80.0–100.0)
Platelets: 224 10*3/uL (ref 150–440)
RBC: 5.04 MIL/uL (ref 4.40–5.90)
RDW: 19.4 % — ABNORMAL HIGH (ref 11.5–14.5)
WBC: 6.3 10*3/uL (ref 3.8–10.6)

## 2016-07-14 LAB — LIPASE, BLOOD: Lipase: 27 U/L (ref 11–51)

## 2016-07-14 LAB — TROPONIN I
Troponin I: <0.03 ng/mL (ref <0.03)
Troponin I: <0.03 ng/mL (ref <0.03)

## 2016-07-14 MED ORDER — GI COCKTAIL ~~LOC~~
30.0000 mL | Freq: Once | ORAL | Status: AC
  Administered 2016-07-14: 30 mL via ORAL
  Filled 2016-07-14: qty 30

## 2016-07-14 NOTE — ED Provider Notes (Signed)
[facility] Emergency Department Provider Note   ____________________________________________   First MD Initiated Contact with Patient 07/14/16 [phone]     (approximate)  I have reviewed the triage vital signs and the nursing notes.   HISTORY  Chief Complaint Abdominal Pain and Chest Pain    HPI Lawrence [name] is a 60 y.o. male who presents to the ED from home with a chief complaint of chest and abdominal pain. Patient has a history of hypertension, diabetes, CAD, COPD on continuous oxygen who presents frequently to the ED with similar complaints. He was recently hospitalized 2 weeks ago for same with negative repeat troponins and CT scan. Reports onset of symptoms last evening with epigastric and central chest pain. Symptoms associated with vomiting 2. Denies associated diaphoresis, shortness of breath, palpitations, dizziness. Denies recent travel or trauma. Nothing makes his symptoms better or worse.   Past Medical History:  Diagnosis Date  . A-fib ([place])   . Anemia   . Anxiety   . Asthma   . CAD (coronary artery disease)    a. 2002 CABGx2 (LIMA->LAD, VG->VG->OM1);  b. 09/2012 DES->OM;  c. 03/2015 PTCA of LAD [facility]) in setting of atretic LIMA; d. 05/2015 Cath [facility]): nonobs dzs; e. 06/2015 Cath ([facility]): LM nl, LAD 45p/d ISR, 50d, D1/2 small, LCX 50p/d ISR, OM1 70ost, 30 ISR, VG->OM1 50ost, 77m LIMA->LAD 99p/d - atretic, RCA dom, nl; f.cath 10/16: 40-50%(FFR 0.90) pLAD, 75% (FFR 0.77) mLAD s/p PCI/DES, oRCA 40% (FFR0.95)  . Celiac disease   . Chronic diastolic CHF (congestive heart failure) ([place]    a. 06/2009 Echo: EF 60-65%, Gr 1 DD, triv AI, mildly dil LA, nl RV.  .Marland KitchenCOPD (chronic obstructive pulmonary disease) ([place]    a. Chronic bronchitis and emphysema.  .Marland KitchenDysrhythmia   . Essential hypertension   . History of tobacco abuse    a. Quit 2014.  .Marland KitchenPSVT (paroxysmal supraventricular tachycardia) ([place]    a. 10/2012 Noted on Zio Patch.  . Type II diabetes  mellitus ([facility]     Patient Active Problem List   Diagnosis Date Noted  . Dyspnea 04/03/2016  . Hypotension 04/03/2016  . Chronic kidney disease 04/03/2016  . Anemia 04/03/2016  . Paroxysmal atrial fibrillation ([place] 12/23/2015  . OSA (obstructive sleep apnea) 12/10/2015  . Left inguinal hernia 11/07/2015  . Anxiety 11/07/2015  . Unstable angina ([place] 10/05/2015  . PAF (paroxysmal atrial fibrillation) ([place] 10/05/2015  . Back pain with left-sided radiculopathy 09/30/2015  . Nocturnal hypoxia 09/06/2015  . BPH (benign prostatic hyperplasia) 08/01/2015  . Chronic diastolic CHF (congestive heart failure) ([place]   . Angina pectoris ([place]   . Chest pain 07/11/2015  . COPD (chronic obstructive pulmonary disease) ([place] 07/03/2015  . CAD (coronary artery disease) 06/26/2015  . HTN (hypertension) 06/26/2015  . DM (diabetes mellitus) ([place] 06/26/2015  . Achalasia 07/24/2014  . GERD (gastroesophageal reflux disease) 06/07/2014  . Former tobacco use 04/11/2013  . HLD (hyperlipidemia) 04/09/2013    Past Surgical History:  Procedure Laterality Date  . BYPASS GRAFT    . CARDIAC CATHETERIZATION N/A 07/12/2015   rocedure: Left Heart Cath and Cors/Grafts Angiography;  Surgeon: HBelva Crome MD;  [place] [place];  Service: Cardiovascular;  Laterality: N/A;  . CARDIAC CATHETERIZATION Right 10/07/2015   Procedure: Left Heart Cath and Cors/Grafts Angiography;  Surgeon: SDionisio David MD;  [place];  Service: Cardiovascular;  Laterality: Right;  . CARDIAC CATHETERIZATION N/A 04/06/2016   Procedure:  Left Heart Cath and Coronary Angiography;  Surgeon: Yolonda Kida, MD;  Location: [place] [facility] LAB;  Service: Cardiovascular;  Laterality: N/A;  . CARDIAC CATHETERIZATION  04/06/2016   Procedure: Bypass Graft Angiography;  Surgeon: Yolonda Kida, MD;  Location: [place] CV LAB;  Service: Cardiovascular;;  . CHOLECYSTECTOMY    . ESOPHAGEAL DILATION    .  TONSILLECTOMY    . VASCULAR SURGERY      Prior to Admission medications   Medication Sig Start Date End Date Taking? Authorizing Provider  albuterol (PROVENTIL HFA;VENTOLIN HFA) 108 (90 Base) MCG/ACT inhaler Inhale 4-6 puffs into the lungs every 4 (four) hours as needed for wheezing or shortness of breath. Reported on 04/08/2016    Historical Provider, MD  allopurinol (ZYLOPRIM) 100 MG tablet THREE (3) TABLETS TWICE DAILY 04/25/16   Birdie Sons, MD  ALPRAZolam Duanne Moron) 1 MG tablet Take 0.5-1 tablets (0.5-1 mg total) by mouth 3 (three) times daily as needed for anxiety. 05/26/16   Birdie Sons, MD  amLODipine (NORVASC) 10 MG tablet Take 10 mg by mouth daily.    Historical Provider, MD  apixaban (ELIQUIS) 5 MG TABS tablet Take 5 mg by mouth 2 (two) times daily.    Historical Provider, MD  atorvastatin (LIPITOR) 80 MG tablet TAKE 1 TABLET BY MOUTH EVERY NIGHT AT BEDTIME. 05/26/16   Birdie Sons, MD  cetirizine (ZYRTEC) 10 MG tablet TAKE ONE TABLET AT BEDTIME 04/19/16   Birdie Sons, MD  clopidogrel (PLAVIX) 75 MG tablet Take 1 tablet (75 mg total) by mouth daily. 10/08/15   Nicholes Mango, MD  cyclobenzaprine (FLEXERIL) 10 MG tablet Take 1 tablet (10 mg total) by mouth every 8 (eight) hours as needed for muscle spasms. Patient not taking: Reported on 07/08/2016 01/08/16   Birdie Sons, MD  dicyclomine (BENTYL) 20 MG tablet Take 1 tablet (20 mg total) by mouth 3 (three) times daily as needed (abdominal pain). Patient not taking: Reported on 06/24/2016 04/24/16   Nance Pear, MD  fluticasone furoate-vilanterol (BREO ELLIPTA) 100-25 MCG/INH AEPB Inhale 1 puff into the lungs daily.     Historical Provider, MD  furosemide (LASIX) 80 MG tablet Take 80 mg by mouth 2 (two) times daily.    Historical Provider, MD  isosorbide mononitrate (IMDUR) 60 MG 24 hr tablet Take 120 mg by mouth daily. Reported on 04/24/2016    Historical Provider, MD  lansoprazole (PREVACID) 30 MG capsule Take 30 mg by mouth daily.      Historical Provider, MD  lisinopril (PRINIVIL,ZESTRIL) 5 MG tablet Take 5 mg by mouth daily.    Historical Provider, MD  magnesium oxide (MAG-OX) 400 (241.3 Mg) MG tablet Take 400 mg by mouth daily.    Historical Provider, MD  metFORMIN (GLUCOPHAGE) 500 MG tablet Take 500 mg by mouth daily with breakfast.     Historical Provider, MD  metoprolol tartrate (LOPRESSOR) 25 MG tablet Take 0.5 tablets (12.5 mg total) by mouth 2 (two) times daily. 06/26/16   Epifanio Lesches, MD  nitroGLYCERIN (NITROSTAT) 0.4 MG SL tablet Place 0.4 mg under the tongue every 5 (five) minutes as needed for chest pain. Reported on 05/26/2016    Historical Provider, MD  omega-3 acid ethyl esters (LOVAZA) 1 g capsule Take 4 g by mouth daily.    Historical Provider, MD  ondansetron (ZOFRAN) 4 MG tablet Take 1 tablet (4 mg total) by mouth every 8 (eight) hours as needed for nausea or vomiting. Patient not taking: Reported on  06/24/2016 04/24/16   Nance Pear, MD  oxyCODONE (OXY IR/ROXICODONE) 5 MG immediate release tablet Take 1 tablet (5 mg total) by mouth every 6 (six) hours as needed for severe pain. Patient not taking: Reported on 06/29/2016 06/26/16   Epifanio Lesches, MD  oxyCODONE-acetaminophen (PERCOCET) 10-325 MG tablet Take 1 tablet by mouth every 4 (four) hours as needed. Pt taking once a day    Historical Provider, MD  RANEXA 500 MG 12 hr tablet TAKE 2 TABLETS BY MOUTH TWICE A DAY. 04/09/16   Birdie Sons, MD  sucralfate (CARAFATE) 1 g tablet Take 1 tablet (1 g total) by mouth 2 (two) times daily. 04/22/16   [name], MD  tamsulosin (FLOMAX) 0.4 MG CAPS capsule Take 0.4 mg by mouth daily after breakfast.     Historical Provider, MD  tiotropium (SPIRIVA) 18 MCG inhalation capsule Place 18 mcg into inhaler and inhale daily.    Historical Provider, MD    Allergies Demerol [meperidine]; Prednisone; Sulfa antibiotics; Albuterol sulfate [albuterol]; and Morphine sulfate  Family History  Problem Relation  Age of Onset  . Heart attack Mother   . Depression Mother   . Heart disease Mother   . COPD Mother   . Hypertension Mother   . Heart attack Father   . Diabetes Father   . Depression Father   . Heart disease Father   . Cirrhosis Father   . Parkinson's disease Brother     Social History Social History  Substance Use Topics  . Smoking status: Former Smoker    Quit date: 04/22/2013  . Smokeless tobacco: Never Used  . Alcohol use No     Comment: remotely quit alcohol use. Hx of heavy alcohol use.    Review of Systems  Constitutional: No fever/chills. Eyes: No visual changes. ENT: No sore throat. Cardiovascular: Positive for chest pain. Respiratory: Negative for shortness of breath. Gastrointestinal: Positive for abdominal pain.  No nausea, positive for vomiting.  No diarrhea.  No constipation. Genitourinary: Negative for dysuria. Musculoskeletal: Negative for back pain. Skin: Negative for rash. Neurological: Negative for headaches, focal weakness or numbness.  10-point ROS otherwise negative.  ____________________________________________   PHYSICAL EXAM:  VITAL SIGNS: ED Triage Vitals  Enc Vitals Group     BP 07/14/16 0020 (!) 155/77     Pulse Rate 07/14/16 0020 (!) 59     Resp 07/14/16 0020 20     Temp 07/14/16 0020 97.6 F (36.4 C)     Temp Source 07/14/16 0020 Oral     SpO2 07/14/16 0020 (!) 9 %     Weight 07/14/16 0021 244 lb (110.7 kg)     Height 07/14/16 0021 5' 7"  (1.702 m)     Head Circumference --      Peak Flow --      Pain Score 07/14/16 0021 9     Pain Loc --      Pain Edu? --      Excl. in [place]? --     Constitutional: Asleep, easily awakened for exam. Alert and oriented. Well appearing and in no acute distress. Eyes: Conjunctivae are normal. PERRL. EOMI. Head: Atraumatic. Nose: No congestion/rhinnorhea. Mouth/Throat: Mucous membranes are moist.  Oropharynx non-erythematous. Neck: No stridor.   Cardiovascular: Normal rate, regular rhythm.  Grossly normal heart sounds.  Good peripheral circulation. Respiratory: Normal respiratory effort.  No retractions. Lungs CTAB. Gastrointestinal: Soft and nontender. No distention. No abdominal bruits. No CVA tenderness. Musculoskeletal: No lower extremity tenderness nor edema.  No  joint effusions. Neurologic:  Normal speech and language. No gross focal neurologic deficits are appreciated. No gait instability. Skin:  Skin is warm, dry and intact. No rash noted. Psychiatric: Mood and affect are normal. Speech and behavior are normal.  ____________________________________________   LABS (all labs ordered are listed, but only abnormal results are displayed)  Labs Reviewed  BASIC METABOLIC PANEL - Abnormal; Notable for the following:       Result Value   Glucose, Bld 120 (*)    Anion gap 3 (*)    All other components within normal limits  CBC - Abnormal; Notable for the following:    Hemoglobin 12.6 (*)    HCT 38.5 (*)    MCV 76.4 (*)    MCH 25.0 (*)    RDW 19.4 (*)    All other components within normal limits  TROPONIN I  TROPONIN I  HEPATIC FUNCTION PANEL  LIPASE, BLOOD   ____________________________________________  EKG  ED ECG REPORT I, Josephyne Tarter J, the attending physician, personally viewed and interpreted this ECG.   Date: 07/14/2016  EKG Time: 0025  Rate: 57  Rhythm: sinus bradycardia  Axis: Normal  Intervals:none  ST&T Change: Nonspecific  ____________________________________________  RADIOLOGY  Portable chest x-ray (viewed by me, interpreted per Dr. Alroy Dust): No active disease. ____________________________________________   PROCEDURES  Procedure(s) performed: None  Procedures  Critical Care performed: No  ____________________________________________   INITIAL IMPRESSION / ASSESSMENT AND PLAN / ED COURSE  Pertinent labs & imaging results that were available during my care of the patient were reviewed by me and considered in my medical decision  making (see chart for details).  60 year old male with hypertension, CAD, COPD on continuous oxygen who presents with epigastric and chest pain similar to multiple prior presentations. Will add hepatic panel and lipase given patient's prior history of pancreatitis. On recent hospitalization, he was thought to have gastritis. Will administer GI cocktail and reassess.  Clinical Course  Comment By Time  Awakened patient to update him of negative repeat troponin as well as normal hepatic panel. Encouraged him to speak with his PCP regarding a referral to the pain management clinic for his chronic pain. Strict return precautions given. Patient verbalizes understanding and agrees with plan of care. Paulette Blanch, MD 08/29 [phone]     ____________________________________________   FINAL CLINICAL IMPRESSION(S) / ED DIAGNOSES  Final diagnoses:  Epigastric pain  Chest pain, unspecified chest pain type      NEW MEDICATIONS STARTED DURING THIS VISIT:  New Prescriptions   No medications on file     Note:  This document was prepared using Dragon voice recognition software and may include unintentional dictation errors.    Paulette Blanch, MD 07/14/16 [phone]

## 2016-07-14 NOTE — ED Notes (Addendum)
Pt states centralized CP and L CP with L middle abdominal pain. This all began Monday morning (yesterday), pt reports throwing up twice but denies diarrhea. Hx COPD, emphysema. Pt wears 2 L nasal cannula at home. Pt states he took percocet without relief.

## 2016-07-14 NOTE — ED Notes (Signed)
Dr. Beather Arbour at bedside.

## 2016-07-14 NOTE — ED Notes (Signed)
AAOx3.  Skin warm and dry. No SOB/ DOE.  Gait steady, posture upright and relaxed. NAD

## 2016-07-14 NOTE — ED Triage Notes (Signed)
Pt ambulatory to triage.  Pt has abd pain and chest pain.  Sx began tonight.  Vomited x 2 today.  No diaphoresis.  nonradiating pain in chest.  Pt is on 2 liters oxygen for copd   Pt alert.  Speech clear.

## 2016-07-14 NOTE — Discharge Instructions (Signed)
1. Continue all daily medications. 2. Talk to your doctor about referring you to the pain clinic for your chronic chest and abdominal pain. 3. Return to the ER for worsening symptoms, persistent vomiting, difficulty breathing or other concerns.

## 2016-07-15 ENCOUNTER — Other Ambulatory Visit: Payer: Self-pay | Admitting: *Deleted

## 2016-07-15 NOTE — Patient Outreach (Addendum)
Attempt made to contact pt for transition of care call- follow up on recent hospitalization 8/8-8/11 LLQ pain,COPD exacerbation,chest pain as well as 8/29 ED visit (chest/abdominal pain).  HIPAA compliant voice message left with contact name and number.   Plan:  If no response to voice message left, plan to follow up again in 1-2 days.     Zara Chess.   [facility]  [phone]

## 2016-07-15 NOTE — Patient Outreach (Signed)
Error entry.   Lawrence [name].   [facility] [facility]  [phone]

## 2016-07-16 ENCOUNTER — Other Ambulatory Visit: Payer: Self-pay | Admitting: *Deleted

## 2016-07-16 NOTE — Patient Outreach (Signed)
Transition of care call successful- follow up on recent hospitalization 8/9-8/11COPD exacerbation, chest pain, LLQ pain as well as 8/29 ED visit (chest, abdominal pain).  Spoke with pt, HIPAA verified.  Pt reports currently no chest pain, abdominal pain just a little, on pain medication for his back but does not help chest pain.  Pt reports he is aware chest pain coming from little arteries blocked, nothing they can do but abdominal pain feels like pancreatitis.  RN CM discussed with pt ED MD's recommendation to talk to Dr. [name]- get referral to  Pain management clinic for chronic pain.  Pt reports he is to see Dr. [name] tomorrow, will talk to him about his pain medication.   As far as transportation, family to take him to MD appointment tomorrow, might use [employer] transportation when see Heart MD 9/14.     Plan:  RN CM to follow up with pt again next week telephonically- part of ongoing transition of care.    Zara Chess.   [place]  [phone]

## 2016-07-17 ENCOUNTER — Encounter: Payer: Self-pay | Admitting: Family Medicine

## 2016-07-17 ENCOUNTER — Ambulatory Visit (INDEPENDENT_AMBULATORY_CARE_PROVIDER_SITE_OTHER): Payer: Commercial Managed Care - HMO | Admitting: Family Medicine

## 2016-07-17 VITALS — BP 156/90 | HR 72 | Temp 97.6°F | Resp 20 | Ht 67.0 in | Wt 248.0 lb

## 2016-07-17 DIAGNOSIS — R1013 Epigastric pain: Secondary | ICD-10-CM | POA: Diagnosis not present

## 2016-07-17 DIAGNOSIS — Z23 Encounter for immunization: Secondary | ICD-10-CM

## 2016-07-17 DIAGNOSIS — I25118 Atherosclerotic heart disease of native coronary artery with other forms of angina pectoris: Secondary | ICD-10-CM | POA: Diagnosis not present

## 2016-07-17 MED ORDER — OXYCODONE-ACETAMINOPHEN 10-325 MG PO TABS: 1.0000 | ORAL_TABLET | ORAL | 0 refills | Status: DC | PRN

## 2016-07-17 NOTE — Progress Notes (Signed)
Patient: Lawrence [name] Male    DOB: 06-22-1956   60 y.o.   MRN: [id number] Visit Date: 07/17/2016  Today's Provider: [name], MD   Chief Complaint  Patient presents with  . Follow-up    [facility] ER   Subjective:    HPI  Follow up ER visit  Patient was seen in [facility] ER for abdominal pain and chest pain on 07/14/2016. He was treated for Epigastric pain and chest pain, unspecified chest pain type. Treatment for this included unremarkable EKG, chest XR and Abdominal CT.  He had normal CBC, BMP, troponin, lipase and liver functions. Marland Kitchen He reports excellent compliance with treatment. He reports this condition is Improved. Patient reports that he has an appt scheduled with Dr. [name] on 07/30/2016 at @:24 pm. Patient reports that he also needs an appt with GI. He has been followed by Dr. Rayann Heman in the past and needs referral to get back in for follow up of new symptoms.    ------------------------------------------------------------------------------------      Allergies  Allergen Reactions  . Demerol [Meperidine] Hives  . Prednisone Other (See Comments) and Hypertension    Pt states that this medication puts him in A-fib   . Sulfa Antibiotics Hives  . Albuterol Sulfate [Albuterol] Palpitations and Other (See Comments)    Pt currently uses this medication.    . Morphine Sulfate Nausea And Vomiting, Rash and Other (See Comments)    Pt states that he is only allergic to the tablet form of this medication.     Current Meds  Medication Sig  . albuterol (PROVENTIL HFA;VENTOLIN HFA) 108 (90 Base) MCG/ACT inhaler Inhale 4-6 puffs into the lungs every 4 (four) hours as needed for wheezing or shortness of breath. Reported on 04/08/2016  . allopurinol (ZYLOPRIM) 100 MG tablet THREE (3) TABLETS TWICE DAILY  . ALPRAZolam (XANAX) 1 MG tablet Take 0.5-1 tablets (0.5-1 mg total) by mouth 3 (three) times daily as needed for anxiety.  Marland Kitchen amLODipine (NORVASC) 10 MG tablet Take 10 mg by  mouth daily.  Marland Kitchen apixaban (ELIQUIS) 5 MG TABS tablet Take 5 mg by mouth 2 (two) times daily.  Marland Kitchen atorvastatin (LIPITOR) 80 MG tablet TAKE 1 TABLET BY MOUTH EVERY NIGHT AT BEDTIME.  . cetirizine (ZYRTEC) 10 MG tablet TAKE ONE TABLET AT BEDTIME  . clopidogrel (PLAVIX) 75 MG tablet Take 1 tablet (75 mg total) by mouth daily.  . fluticasone furoate-vilanterol (BREO ELLIPTA) 100-25 MCG/INH AEPB Inhale 1 puff into the lungs daily.   . furosemide (LASIX) 80 MG tablet Take 80 mg by mouth 2 (two) times daily.  . isosorbide mononitrate (IMDUR) 60 MG 24 hr tablet Take 120 mg by mouth daily. Reported on 04/24/2016  . lansoprazole (PREVACID) 30 MG capsule Take 30 mg by mouth daily.   Marland Kitchen lisinopril (PRINIVIL,ZESTRIL) 5 MG tablet Take 5 mg by mouth daily.  . magnesium oxide (MAG-OX) 400 (241.3 Mg) MG tablet Take 400 mg by mouth daily.  . metFORMIN (GLUCOPHAGE) 500 MG tablet Take 500 mg by mouth daily with breakfast.   . metoprolol tartrate (LOPRESSOR) 25 MG tablet Take 0.5 tablets (12.5 mg total) by mouth 2 (two) times daily.  . nitroGLYCERIN (NITROSTAT) 0.4 MG SL tablet Place 0.4 mg under the tongue every 5 (five) minutes as needed for chest pain. Reported on 05/26/2016  . omega-3 acid ethyl esters (LOVAZA) 1 g capsule Take 4 g by mouth daily.  . ondansetron (ZOFRAN) 4 MG tablet Take 1 tablet (4  mg total) by mouth every 8 (eight) hours as needed for nausea or vomiting.  Marland Kitchen oxyCODONE-acetaminophen (PERCOCET) 10-325 MG tablet Take 1 tablet by mouth every 4 (four) hours as needed. Pt taking once a day  . RANEXA 500 MG 12 hr tablet TAKE 2 TABLETS BY MOUTH TWICE A DAY.  . sucralfate (CARAFATE) 1 g tablet Take 1 tablet (1 g total) by mouth 2 (two) times daily.  . tamsulosin (FLOMAX) 0.4 MG CAPS capsule Take 0.4 mg by mouth daily after breakfast.   . tiotropium (SPIRIVA) 18 MCG inhalation capsule Place 18 mcg into inhaler and inhale daily.    Review of Systems  Constitutional: Negative.   Respiratory: Positive for  cough and shortness of breath.   Cardiovascular: Negative.   Musculoskeletal: Positive for back pain.    Social History  Substance Use Topics  . Smoking status: Former Smoker    Quit date: 04/22/2013  . Smokeless tobacco: Never Used  . Alcohol use No     Comment: remotely quit alcohol use. Hx of heavy alcohol use.   Objective:   BP (!) 156/90 (BP Location: Left Arm, Patient Position: Sitting, Cuff Size: Large)   Pulse 72   Temp 97.6 F (36.4 C) (Oral)   Resp 20   Ht 5' 7"  (1.702 m)   Wt 248 lb (112.5 kg)   SpO2 96%   BMI 38.84 kg/m   Physical Exam  General Appearance:    Alert, cooperative, no distress, obese  Eyes:    PERRL, conjunctiva/corneas clear, EOM's intact       Lungs:     Clear to auscultation bilaterally, respirations unlabored  Heart:    Regular rate and rhythm  Neurologic:   Awake, alert, oriented x 3. No apparent focal neurological           defect.           Assessment & Plan:         The entirety of the information documented in the History of Present Illness, Review of Systems and Physical Exam were personally obtained by me. Portions of this information were initially documented by Lynford Humphrey, CMA and reviewed by me for thoroughness and accuracy.    [name], MD  [facility]

## 2016-07-21 DIAGNOSIS — J9811 Atelectasis: Secondary | ICD-10-CM | POA: Diagnosis not present

## 2016-07-21 DIAGNOSIS — I2510 Atherosclerotic heart disease of native coronary artery without angina pectoris: Secondary | ICD-10-CM | POA: Insufficient documentation

## 2016-07-21 DIAGNOSIS — N189 Chronic kidney disease, unspecified: Secondary | ICD-10-CM | POA: Diagnosis not present

## 2016-07-21 DIAGNOSIS — Z87891 Personal history of nicotine dependence: Secondary | ICD-10-CM | POA: Insufficient documentation

## 2016-07-21 DIAGNOSIS — J441 Chronic obstructive pulmonary disease with (acute) exacerbation: Secondary | ICD-10-CM | POA: Diagnosis not present

## 2016-07-21 DIAGNOSIS — R0602 Shortness of breath: Secondary | ICD-10-CM | POA: Diagnosis present

## 2016-07-21 DIAGNOSIS — I130 Hypertensive heart and chronic kidney disease with heart failure and stage 1 through stage 4 chronic kidney disease, or unspecified chronic kidney disease: Secondary | ICD-10-CM | POA: Insufficient documentation

## 2016-07-21 DIAGNOSIS — I5033 Acute on chronic diastolic (congestive) heart failure: Secondary | ICD-10-CM | POA: Diagnosis not present

## 2016-07-21 DIAGNOSIS — R1013 Epigastric pain: Secondary | ICD-10-CM | POA: Insufficient documentation

## 2016-07-21 DIAGNOSIS — Z7984 Long term (current) use of oral hypoglycemic drugs: Secondary | ICD-10-CM | POA: Diagnosis not present

## 2016-07-21 DIAGNOSIS — J45909 Unspecified asthma, uncomplicated: Secondary | ICD-10-CM | POA: Diagnosis not present

## 2016-07-21 DIAGNOSIS — I509 Heart failure, unspecified: Secondary | ICD-10-CM | POA: Diagnosis not present

## 2016-07-21 DIAGNOSIS — I110 Hypertensive heart disease with heart failure: Secondary | ICD-10-CM | POA: Diagnosis not present

## 2016-07-21 DIAGNOSIS — E1122 Type 2 diabetes mellitus with diabetic chronic kidney disease: Secondary | ICD-10-CM | POA: Insufficient documentation

## 2016-07-22 ENCOUNTER — Encounter: Payer: Self-pay | Admitting: Emergency Medicine

## 2016-07-22 ENCOUNTER — Emergency Department
Admission: EM | Admit: 2016-07-22 | Discharge: 2016-07-22 | Disposition: A | Discharge status: Home / Self Care | Payer: Commercial Managed Care - HMO | Attending: Emergency Medicine | Admitting: Emergency Medicine

## 2016-07-22 ENCOUNTER — Emergency Department: Payer: Commercial Managed Care - HMO

## 2016-07-22 DIAGNOSIS — J9811 Atelectasis: Secondary | ICD-10-CM | POA: Diagnosis not present

## 2016-07-22 DIAGNOSIS — J441 Chronic obstructive pulmonary disease with (acute) exacerbation: Secondary | ICD-10-CM

## 2016-07-22 DIAGNOSIS — I509 Heart failure, unspecified: Secondary | ICD-10-CM

## 2016-07-22 LAB — HEPATIC FUNCTION PANEL
ALT: 21 U/L (ref 17–63)
AST: 27 U/L (ref 15–41)
Albumin: 3.5 g/dL (ref 3.5–5.0)
Alkaline Phosphatase: 82 U/L (ref 38–126)
Bilirubin, Direct: <0.1 mg/dL — ABNORMAL LOW (ref 0.1–0.5)
Total Bilirubin: 0.4 mg/dL (ref 0.3–1.2)
Total Protein: 6.6 g/dL (ref 6.5–8.1)

## 2016-07-22 LAB — CBC
HCT: 37.6 % — ABNORMAL LOW (ref 40.0–52.0)
Hemoglobin: 12.1 g/dL — ABNORMAL LOW (ref 13.0–18.0)
MCH: 25 pg — ABNORMAL LOW (ref 26.0–34.0)
MCHC: 32.3 g/dL (ref 32.0–36.0)
MCV: 77.3 fL — ABNORMAL LOW (ref 80.0–100.0)
Platelets: 196 10*3/uL (ref 150–440)
RBC: 4.87 MIL/uL (ref 4.40–5.90)
RDW: 20.6 % — ABNORMAL HIGH (ref 11.5–14.5)
WBC: 5.3 10*3/uL (ref 3.8–10.6)

## 2016-07-22 LAB — BASIC METABOLIC PANEL
Anion gap: 4 — ABNORMAL LOW (ref 5–15)
BUN: 11 mg/dL (ref 6–20)
CO2: 30 mmol/L (ref 22–32)
Calcium: 9.1 mg/dL (ref 8.9–10.3)
Chloride: 105 mmol/L (ref 101–111)
Creatinine, Ser: 1.02 mg/dL (ref 0.61–1.24)
GFR calc Af Amer: >60 mL/min (ref >60)
GFR calc non Af Amer: >60 mL/min (ref >60)
Glucose, Bld: 146 mg/dL — ABNORMAL HIGH (ref 65–99)
Potassium: 3.8 mmol/L (ref 3.5–5.1)
Sodium: 139 mmol/L (ref 135–145)

## 2016-07-22 LAB — BRAIN NATRIURETIC PEPTIDE: B Natriuretic Peptide: 40 pg/mL (ref 0.0–100.0)

## 2016-07-22 LAB — TROPONIN I
Troponin I: <0.03 ng/mL (ref <0.03)
Troponin I: <0.03 ng/mL (ref <0.03)

## 2016-07-22 LAB — LIPASE, BLOOD: Lipase: 33 U/L (ref 11–51)

## 2016-07-22 MED ORDER — LEVALBUTEROL HCL 1.25 MG/0.5ML IN NEBU
1.2500 mg | INHALATION_SOLUTION | Freq: Once | RESPIRATORY_TRACT | Status: AC
  Administered 2016-07-22: 1.25 mg via RESPIRATORY_TRACT
  Filled 2016-07-22: qty 0.5

## 2016-07-22 MED ORDER — FAMOTIDINE IN NACL 20-0.9 MG/50ML-% IV SOLN
20.0000 mg | Freq: Once | INTRAVENOUS | Status: AC
  Administered 2016-07-22: 20 mg via INTRAVENOUS
  Filled 2016-07-22: qty 50

## 2016-07-22 MED ORDER — OXYCODONE-ACETAMINOPHEN 5-325 MG PO TABS
1.0000 | ORAL_TABLET | Freq: Once | ORAL | Status: AC
  Administered 2016-07-22: 1 via ORAL
  Filled 2016-07-22: qty 1

## 2016-07-22 MED ORDER — AZITHROMYCIN 250 MG PO TABS: ORAL_TABLET | ORAL | 0 refills | Status: DC

## 2016-07-22 MED ORDER — FUROSEMIDE 10 MG/ML IJ SOLN
40.0000 mg | Freq: Once | INTRAMUSCULAR | Status: AC
  Administered 2016-07-22: 40 mg via INTRAVENOUS
  Filled 2016-07-22: qty 4

## 2016-07-22 MED ORDER — OXYCODONE-ACETAMINOPHEN 5-325 MG PO TABS: 1.0000 | ORAL_TABLET | ORAL | 0 refills | Status: DC | PRN

## 2016-07-22 MED ORDER — ONDANSETRON HCL 4 MG/2ML IJ SOLN
4.0000 mg | Freq: Once | INTRAMUSCULAR | Status: AC
  Administered 2016-07-22: 4 mg via INTRAVENOUS
  Filled 2016-07-22: qty 2

## 2016-07-22 MED ORDER — AZITHROMYCIN 500 MG PO TABS
500.0000 mg | ORAL_TABLET | Freq: Once | ORAL | Status: AC
  Administered 2016-07-22: 500 mg via ORAL
  Filled 2016-07-22 (×2): qty 1

## 2016-07-22 MED ORDER — GI COCKTAIL ~~LOC~~
30.0000 mL | Freq: Once | ORAL | Status: AC
  Administered 2016-07-22: 30 mL via ORAL
  Filled 2016-07-22: qty 30

## 2016-07-22 MED ORDER — HYDROMORPHONE HCL 1 MG/ML IJ SOLN
0.5000 mg | Freq: Once | INTRAMUSCULAR | Status: AC
  Administered 2016-07-22: 0.5 mg via INTRAVENOUS
  Filled 2016-07-22: qty 1

## 2016-07-22 NOTE — Discharge Instructions (Signed)
Take lasix 40 mg daily. Take azithromycin as prescribed. Use your inhaler every 4-6 hours for shortness of breath. Follow up with your doctor in 1-2 days. Follow-up with Dr. [name] on your upcoming appointment. Return to the emergency room if you have chest pain, shortness of breath, fever, or any other symptoms concerning to you.

## 2016-07-22 NOTE — ED Notes (Signed)
Pt was ambulated from bed to nurses station. Pt was on portable O2 tank, 2L [place] . Pt uses O2 at home. Pt's O2 saturation maintained between 96% to 100% during ambulation.

## 2016-07-22 NOTE — ED Notes (Signed)
Patient transported back from X-ray

## 2016-07-22 NOTE — ED Provider Notes (Signed)
[facility] Emergency Department Provider Note  ____________________________________________  Time seen: Approximately 3:02 AM  I have reviewed the triage vital signs and the nursing notes.   HISTORY  Chief Complaint Chest Pain; Flank Pain; and Abdominal Pain   HPI Lawrence [name] is a 60 y.o. male history of CAD status post CABG, COPD, CHF, atrial fibrillation, diabetes who presents for evaluation of shortness of breath. Patient reports for the last 3 days he has gained 68 pounds, has bilateral edema and also has had worsening shortness of breath. He is on 2 L nasal cannula at home and has not required any more oxygen. He reports that yesterday he increased his Lasix from 20-40 but still continues to feel short of breath. He complains of chest pressure that is present since the shortness of breath was started and worse with exertion. The chest pressure is constant for the last 3 days. He has been using his inhalers at home with no relief. He endorses worsening productive cough with white phlegm, no chills, no fever. He is also complaining of epigastric abdominal pain that he's had for many months. He is scheduled to see a GI specialist on October 15. He does have a diagnosis of achalasia. He reports that the pain is constant and unchanged for the last few months. He has had imaging studies that were negative for any acute pathology. He has been trying to get in with pain clinic for his chronic abdominal pain however hasn't been successful in getting an appointment yet. He denies nausea, vomiting, diarrhea.  Past Medical History:  Diagnosis Date  . A-fib ([place])   . Anemia   . Anxiety   . Asthma   . CAD (coronary artery disease)    a. 2002 CABGx2 (LIMA->LAD, VG->VG->OM1);  b. 09/2012 DES->OM;  c. 03/2015 PTCA of LAD [facility]) in setting of atretic LIMA; d. 05/2015 Cath [facility]): nonobs dzs; e. 06/2015 Cath ([facility]): LM nl, LAD 45p/d ISR, 50d, D1/2 small, LCX 50p/d ISR, OM1 70ost,  30 ISR, VG->OM1 50ost, 32m LIMA->LAD 99p/d - atretic, RCA dom, nl; f.cath 10/16: 40-50%(FFR 0.90) pLAD, 75% (FFR 0.77) mLAD s/p PCI/DES, oRCA 40% (FFR0.95)  . Celiac disease   . Chronic diastolic CHF (congestive heart failure) ([place]    a. 06/2009 Echo: EF 60-65%, Gr 1 DD, triv AI, mildly dil LA, nl RV.  .Marland KitchenCOPD (chronic obstructive pulmonary disease) ([place]    a. Chronic bronchitis and emphysema.  .Marland KitchenDysrhythmia   . Essential hypertension   . History of tobacco abuse    a. Quit 2014.  .Marland KitchenPSVT (paroxysmal supraventricular tachycardia) ([place]    a. 10/2012 Noted on Zio Patch.  . Type II diabetes mellitus ([facility]     Patient Active Problem List   Diagnosis Date Noted  . Dyspnea 04/03/2016  . Hypotension 04/03/2016  . Chronic kidney disease 04/03/2016  . Anemia 04/03/2016  . Paroxysmal atrial fibrillation ([place] 12/23/2015  . OSA (obstructive sleep apnea) 12/10/2015  . Left inguinal hernia 11/07/2015  . Anxiety 11/07/2015  . Unstable angina ([place] 10/05/2015  . PAF (paroxysmal atrial fibrillation) ([place] 10/05/2015  . Back pain with left-sided radiculopathy 09/30/2015  . Nocturnal hypoxia 09/06/2015  . BPH (benign prostatic hyperplasia) 08/01/2015  . Chronic diastolic CHF (congestive heart failure) ([place]   . Angina pectoris ([place]   . Chest pain 07/11/2015  . COPD (chronic obstructive pulmonary disease) ([place] 07/03/2015  . CAD (coronary artery disease) 06/26/2015  . HTN (hypertension) 06/26/2015  . DM (diabetes mellitus) (  [place]) 06/26/2015  . Achalasia 07/24/2014  . GERD (gastroesophageal reflux disease) 06/07/2014  . Former tobacco use 04/11/2013  . HLD (hyperlipidemia) 04/09/2013    Past Surgical History:  Procedure Laterality Date  . BYPASS GRAFT    . CARDIAC CATHETERIZATION N/A 07/12/2015   rocedure: Left Heart Cath and Cors/Grafts Angiography;  Surgeon: Belva Crome, MD;  Location: [place] CV LAB;  Service: Cardiovascular;  Laterality: N/A;  . CARDIAC CATHETERIZATION Right  10/07/2015   Procedure: Left Heart Cath and Cors/Grafts Angiography;  Surgeon: Dionisio David, MD;  Location: [place] CV LAB;  Service: Cardiovascular;  Laterality: Right;  . CARDIAC CATHETERIZATION N/A 04/06/2016   Procedure: Left Heart Cath and Coronary Angiography;  Surgeon: Yolonda Kida, MD;  Location: [place] CV LAB;  Service: Cardiovascular;  Laterality: N/A;  . CARDIAC CATHETERIZATION  04/06/2016   Procedure: Bypass Graft Angiography;  Surgeon: Yolonda Kida, MD;  Location: [place] CV LAB;  Service: Cardiovascular;;  . CHOLECYSTECTOMY    . ESOPHAGEAL DILATION    . TONSILLECTOMY    . VASCULAR SURGERY      Prior to Admission medications   Medication Sig Start Date End Date Taking? Authorizing Provider  albuterol (PROVENTIL HFA;VENTOLIN HFA) 108 (90 Base) MCG/ACT inhaler Inhale 4-6 puffs into the lungs every 4 (four) hours as needed for wheezing or shortness of breath. Reported on 04/08/2016    Historical Provider, MD  allopurinol (ZYLOPRIM) 100 MG tablet THREE (3) TABLETS TWICE DAILY 04/25/16   Birdie Sons, MD  ALPRAZolam Duanne Moron) 1 MG tablet Take 0.5-1 tablets (0.5-1 mg total) by mouth 3 (three) times daily as needed for anxiety. 05/26/16   Birdie Sons, MD  amLODipine (NORVASC) 10 MG tablet Take 10 mg by mouth daily.    Historical Provider, MD  apixaban (ELIQUIS) 5 MG TABS tablet Take 5 mg by mouth 2 (two) times daily.    Historical Provider, MD  atorvastatin (LIPITOR) 80 MG tablet TAKE 1 TABLET BY MOUTH EVERY NIGHT AT BEDTIME. 05/26/16   Birdie Sons, MD  azithromycin (ZITHROMAX) 250 MG tablet Take 1 tablet daily for 4 days. 07/22/16   Rudene Re, MD  cetirizine (ZYRTEC) 10 MG tablet TAKE ONE TABLET AT BEDTIME 04/19/16   Birdie Sons, MD  clopidogrel (PLAVIX) 75 MG tablet Take 1 tablet (75 mg total) by mouth daily. 10/08/15   Nicholes Mango, MD  cyclobenzaprine (FLEXERIL) 10 MG tablet Take 1 tablet (10 mg total) by mouth every 8 (eight) hours as needed  for muscle spasms. Patient not taking: Reported on 07/08/2016 01/08/16   Birdie Sons, MD  dicyclomine (BENTYL) 20 MG tablet Take 1 tablet (20 mg total) by mouth 3 (three) times daily as needed (abdominal pain). Patient not taking: Reported on 06/24/2016 04/24/16   Nance Pear, MD  fluticasone furoate-vilanterol (BREO ELLIPTA) 100-25 MCG/INH AEPB Inhale 1 puff into the lungs daily.     Historical Provider, MD  furosemide (LASIX) 80 MG tablet Take 80 mg by mouth 2 (two) times daily.    Historical Provider, MD  isosorbide mononitrate (IMDUR) 60 MG 24 hr tablet Take 120 mg by mouth daily. Reported on 04/24/2016    Historical Provider, MD  lansoprazole (PREVACID) 30 MG capsule Take 30 mg by mouth daily.     Historical Provider, MD  lisinopril (PRINIVIL,ZESTRIL) 5 MG tablet Take 5 mg by mouth daily.    Historical Provider, MD  magnesium oxide (MAG-OX) 400 (241.3 Mg) MG tablet Take 400 mg by mouth  daily.    Historical Provider, MD  metFORMIN (GLUCOPHAGE) 500 MG tablet Take 500 mg by mouth daily with breakfast.     Historical Provider, MD  metoprolol tartrate (LOPRESSOR) 25 MG tablet Take 0.5 tablets (12.5 mg total) by mouth 2 (two) times daily. 06/26/16   Epifanio Lesches, MD  nitroGLYCERIN (NITROSTAT) 0.4 MG SL tablet Place 0.4 mg under the tongue every 5 (five) minutes as needed for chest pain. Reported on 05/26/2016    Historical Provider, MD  omega-3 acid ethyl esters (LOVAZA) 1 g capsule Take 4 g by mouth daily.    Historical Provider, MD  ondansetron (ZOFRAN) 4 MG tablet Take 1 tablet (4 mg total) by mouth every 8 (eight) hours as needed for nausea or vomiting. 04/24/16   Nance Pear, MD  oxyCODONE-acetaminophen (ROXICET) 5-325 MG tablet Take 1 tablet by mouth every 4 (four) hours as needed for severe pain. 07/22/16   Rudene Re, MD  RANEXA 500 MG 12 hr tablet TAKE 2 TABLETS BY MOUTH TWICE A DAY. 04/09/16   Birdie Sons, MD  sucralfate (CARAFATE) 1 g tablet Take 1 tablet (1 g total) by  mouth 2 (two) times daily. 04/22/16   [name], MD  tamsulosin (FLOMAX) 0.4 MG CAPS capsule Take 0.4 mg by mouth daily after breakfast.     Historical Provider, MD  tiotropium (SPIRIVA) 18 MCG inhalation capsule Place 18 mcg into inhaler and inhale daily.    Historical Provider, MD    Allergies Demerol [meperidine]; Prednisone; Sulfa antibiotics; Albuterol sulfate [albuterol]; and Morphine sulfate  Family History  Problem Relation Age of Onset  . Heart attack Mother   . Depression Mother   . Heart disease Mother   . COPD Mother   . Hypertension Mother   . Heart attack Father   . Diabetes Father   . Depression Father   . Heart disease Father   . Cirrhosis Father   . Parkinson's disease Brother     Social History Social History  Substance Use Topics  . Smoking status: Former Smoker    Quit date: 04/22/2013  . Smokeless tobacco: Never Used  . Alcohol use No     Comment: remotely quit alcohol use. Hx of heavy alcohol use.    Review of Systems  Constitutional: Negative for fever. Eyes: Negative for visual changes. ENT: Negative for sore throat. Cardiovascular: + chest pain. Respiratory: + shortness of breath, cough Gastrointestinal: + epigastric abdominal pain. No vomiting or diarrhea. Genitourinary: Negative for dysuria. Musculoskeletal: Negative for back pain. Skin: Negative for rash. Neurological: Negative for headaches, weakness or numbness.  ____________________________________________   PHYSICAL EXAM:  VITAL SIGNS: ED Triage Vitals  Enc Vitals Group     BP 07/22/16 0021 (!) 153/81     Pulse Rate 07/22/16 0021 81     Resp 07/22/16 0021 20     Temp 07/22/16 0021 97.5 F (36.4 C)     Temp Source 07/22/16 0021 Oral     SpO2 07/22/16 0021 96 %     Weight 07/22/16 0022 248 lb (112.5 kg)     Height 07/22/16 0022 5' 7"  (1.702 m)     Head Circumference --      Peak Flow --      Pain Score 07/22/16 0021 9     Pain Loc --      Pain Edu? --      Excl. in  [place]? --     Constitutional: Alert and oriented. Well appearing and in no  apparent distress. HEENT:      Head: Normocephalic and atraumatic.         Eyes: Conjunctivae are normal. Sclera is non-icteric. EOMI. PERRL      Mouth/Throat: Mucous membranes are moist.       Neck: Supple with no signs of meningismus. Cardiovascular: Regular rate and rhythm. No murmurs, gallops, or rubs. 2+ symmetrical distal pulses are present in all extremities. No JVD. Respiratory: Normal respiratory effort. Satting normally on 2 L nasal cannula which is patient's baseline. He has some faint scattered expiratory wheezing, no crackles.  Gastrointestinal: Obese with mild tenderness palpation on the epigastric region, and non distended with positive bowel sounds. No rebound or guarding. Genitourinary: No CVA tenderness. Musculoskeletal: 1+ pitting edema bilaterally  Neurologic: Normal speech and language. Face is symmetric. Moving all extremities. No gross focal neurologic deficits are appreciated. Skin: Skin is warm, dry and intact. No rash noted. Psychiatric: Mood and affect are normal. Speech and behavior are normal.  ____________________________________________   LABS (all labs ordered are listed, but only abnormal results are displayed)  Labs Reviewed  BASIC METABOLIC PANEL - Abnormal; Notable for the following:       Result Value   Glucose, Bld 146 (*)    Anion gap 4 (*)    All other components within normal limits  CBC - Abnormal; Notable for the following:    Hemoglobin 12.1 (*)    HCT 37.6 (*)    MCV 77.3 (*)    MCH 25.0 (*)    RDW 20.6 (*)    All other components within normal limits  HEPATIC FUNCTION PANEL - Abnormal; Notable for the following:    Bilirubin, Direct <0.1 (*)    All other components within normal limits  TROPONIN I  LIPASE, BLOOD  BRAIN NATRIURETIC PEPTIDE  TROPONIN I   ____________________________________________  EKG  ED ECG REPORT I, Rudene Re, the attending  physician, personally viewed and interpreted this ECG.  Normal sinus rhythm, rate of 79, normal intervals, normal axis, no ST elevations or depressions. Unchanged from prior ____________________________________________  RADIOLOGY  CXR:  Mild vascular congestion, with mild left basilar atelectasis ____________________________________________   PROCEDURES  Procedure(s) performed: None Procedures Critical Care performed:  None ____________________________________________   INITIAL IMPRESSION / ASSESSMENT AND PLAN / ED COURSE  60 y.o. male history of CAD status post CABG, COPD, CHF, atrial fibrillation, diabetes who presents for evaluation of shortness of breath and chest tightness for the last 3 days in the setting of the increased abdominal girth, weight gain, pitting edema. Patient has increased his Lasix at home with no improvement of his symptoms. Also has had cough productive of white phlegm. On exam patient is in no distress, satting well on 2 L nasal cannula which is his baseline, patient has 1+ pitting edema bilaterally, elevated JVD, faint expiratory wheezes bilaterally. Presentation concerning for mixed picture of COPD versus CHF exacerbation. We'll give him a breathing treatment, get a chest x-ray, BNP. Troponin EKG are negative.    Clinical Course  Comment By Time  BNP is normal. Chest x-ray showing mild pulmonary congestion. We'll give a dose of IV Lasix. Second troponin is due now. Patient also received a breathing treatment. We'll reassess after the second troponin. Rudene Re, MD 09/06 [phone]  Patient made 600 cc of urine. Reports that his shortness of breath has resolved after Lasix and one breathing treatment. We'll discharge him home on azithromycin, albuterol inhaler. Patient has an allergy to prednisone and therefore will not  be discharged home on that. Encourage patient to continue to take 40 mg of Lasix until his upcoming appointment with Dr. [name]. Encourage  patient to follow-up with his PCP in 2 days. Discussed return precautions with patient. Rudene Re, MD 09/06 0532    Pertinent labs & imaging results that were available during my care of the patient were reviewed by me and considered in my medical decision making (see chart for details).    ____________________________________________   FINAL CLINICAL IMPRESSION(S) / ED DIAGNOSES  Final diagnoses:  COPD exacerbation ([place])  Acute on chronic congestive heart failure, unspecified congestive heart failure type ([facility])      NEW MEDICATIONS STARTED DURING THIS VISIT:  New Prescriptions   AZITHROMYCIN (ZITHROMAX) 250 MG TABLET    Take 1 tablet daily for 4 days.   OXYCODONE-ACETAMINOPHEN (ROXICET) 5-325 MG TABLET    Take 1 tablet by mouth every 4 (four) hours as needed for severe pain.     Note:  This document was prepared using Dragon voice recognition software and may include unintentional dictation errors.    Rudene Re, MD 07/22/16 [phone]

## 2016-07-22 NOTE — ED Triage Notes (Addendum)
Pt presents to ED with c/o flank pain, left sided chest pain, and epigastric abd pain. Pt states he has been having the same pain for several months and came to this ED just after its onset to be evaluated. CT of his abd performed at that time with negative results. States he was told by "Dr. Beather Arbour to make an appt with the pain clinic" but there are no available appts for over a month. Pt reports he had a cath in June with same symptoms and was told there were no blockages. Pt states he has been known to have a kidney stone or two so his symptoms may be related to that. Pt resting quietly with no increased work of breathing or acute distress noted at this time. Home O2 in place.

## 2016-07-22 NOTE — ED Notes (Signed)
Pt is awake. Pt given pain medication. Pt is urinating good.

## 2016-07-23 ENCOUNTER — Other Ambulatory Visit: Payer: Self-pay | Admitting: *Deleted

## 2016-07-23 NOTE — Patient Outreach (Signed)
Transition of care call successful, follow up on recent hospitalization 8/9-8/11 for COPD exacerbation,chest pain, LLQ and recent ED visit 9/6 for  sob, chest/flank/abdominal pain.   Spoke with pt, HIPAA verified.  Pt reports on recent ED visit- having problems with stomach, chest pain no better, to see Dr. [name] 9/14.  Pt reports to see GI MD 10/3- appointment moved up.  Pt reports he does not feel he needs to f/u with Dr. [name] as saw him 9/1 but will call him if needed.  Pt reports sob no better with COPD, still taking Lasix 40 mg daily, to pick up antibiotic today.  Pt reports he has a rescue inhaler, has not had to use it today, been doing breathing treatment- helps.  Pt reports did not weigh yet, still have some swelling.  Pt reports weight gain reported at ED was 6-8 lbs.   RN CM reviewed with pt weight gain of >2 lbs in a day, 5 lbs in a week to call Heart MD or Primary care MD to which pt voiced understanding.  Pt reports following up with pain management is on hold now as ED MD thinks could have ulcers- reason to f/u with GI MD.  RN CM inquired of pt his housing situation to which pt reports to check out real estate company today.   Pt reports got transportation covered- got another car which helps.  Plan:  As discussed with pt, plan to follow up again next week telephonically- final transition of care call.     Zara Chess.   [facility]  [phone]

## 2016-07-25 ENCOUNTER — Other Ambulatory Visit: Payer: Self-pay | Admitting: Family Medicine

## 2016-07-27 ENCOUNTER — Other Ambulatory Visit: Payer: Self-pay | Admitting: *Deleted

## 2016-07-27 ENCOUNTER — Telehealth: Payer: Self-pay

## 2016-07-27 MED ORDER — CLOPIDOGREL BISULFATE 75 MG PO TABS: 75.0000 mg | ORAL_TABLET | Freq: Every day | ORAL | 12 refills | Status: DC

## 2016-07-27 NOTE — Telephone Encounter (Signed)
Pharmacist from [facility] wanted to verify that Lawrence [name] is supposed to be on Plavix and Eliquis together.   Thanks,   -Mickel Baas

## 2016-07-27 NOTE — Telephone Encounter (Signed)
Pharmacy was notified and rx for Plavix was canceled.

## 2016-07-27 NOTE — Telephone Encounter (Signed)
No, he should only be taking Eliquis, please cancer Plavix (clopdogrel) prescription.

## 2016-07-29 ENCOUNTER — Encounter: Admission: RE | Payer: Self-pay | Source: Ambulatory Visit

## 2016-07-29 ENCOUNTER — Ambulatory Visit
Admission: RE | Admit: 2016-07-29 | Payer: Commercial Managed Care - HMO | Source: Ambulatory Visit | Admitting: Unknown Physician Specialty

## 2016-07-29 SURGERY — COLONOSCOPY WITH PROPOFOL
Anesthesia: General

## 2016-07-30 ENCOUNTER — Other Ambulatory Visit: Payer: Self-pay | Admitting: *Deleted

## 2016-07-30 ENCOUNTER — Ambulatory Visit: Payer: Self-pay | Admitting: *Deleted

## 2016-07-30 DIAGNOSIS — R238 Other skin changes: Secondary | ICD-10-CM | POA: Diagnosis not present

## 2016-07-30 DIAGNOSIS — K219 Gastro-esophageal reflux disease without esophagitis: Secondary | ICD-10-CM | POA: Diagnosis not present

## 2016-07-30 DIAGNOSIS — I25118 Atherosclerotic heart disease of native coronary artery with other forms of angina pectoris: Secondary | ICD-10-CM | POA: Diagnosis not present

## 2016-07-30 DIAGNOSIS — I10 Essential (primary) hypertension: Secondary | ICD-10-CM | POA: Diagnosis not present

## 2016-07-30 DIAGNOSIS — E782 Mixed hyperlipidemia: Secondary | ICD-10-CM | POA: Diagnosis not present

## 2016-07-30 NOTE — Patient Outreach (Signed)
Transition of care call (final call) unsuccessful.  Pt's recent hospitalization 8/9-8/11 COPD exacerbation, chest/LLQ pain.  Also recent ED visit 9/6 for sob, chest/flank/abdominal pain.  HIPAA compliant voice message  Left with contact name and number.    Plan: if no response, plan to call again within next 6 days.            Update care plans.   Zara Chess.   [facility]  [phone]

## 2016-07-30 NOTE — Patient Outreach (Signed)
Final transition of care call:  Received a return phone call from pt to voice message left earlier today  by RN CM.   Spoke with pt, HIPAA verified, reports things are just going.  Pt reports still have pains in chest and abdomen, to f/u with Dr. [name] today.  Pt reports had some swelling, was on Lasix 40 mg (view in Epic taking bid) increased to 80 mg for  a few days, swelling came down but now it seems like it is coming back.  Pt reports more active he is the more sob.  Pt reports Dr. [name] took him off Plavix after   pharmacist called MD  to see if he suppose to be on both Plavix and Eliquis,  to continue to take Eliquis.  Pt reports still waiting to hear from [facility] - apartment.  RN CM discussed with pt today is final transition of care, will continue to follow with community nurse case management services to which pt agreed.    Plan:  To close transition of care program.             Continue to provide pt with community nurse case               Management services, follow up again next month- home               Visit.

## 2016-07-31 DIAGNOSIS — J449 Chronic obstructive pulmonary disease, unspecified: Secondary | ICD-10-CM | POA: Diagnosis not present

## 2016-07-31 DIAGNOSIS — I5030 Unspecified diastolic (congestive) heart failure: Secondary | ICD-10-CM | POA: Diagnosis not present

## 2016-07-31 DIAGNOSIS — R200 Anesthesia of skin: Secondary | ICD-10-CM | POA: Diagnosis not present

## 2016-07-31 DIAGNOSIS — I25119 Atherosclerotic heart disease of native coronary artery with unspecified angina pectoris: Secondary | ICD-10-CM | POA: Diagnosis not present

## 2016-07-31 DIAGNOSIS — E785 Hyperlipidemia, unspecified: Secondary | ICD-10-CM | POA: Diagnosis not present

## 2016-07-31 DIAGNOSIS — R918 Other nonspecific abnormal finding of lung field: Secondary | ICD-10-CM | POA: Diagnosis not present

## 2016-07-31 DIAGNOSIS — R202 Paresthesia of skin: Secondary | ICD-10-CM | POA: Diagnosis not present

## 2016-07-31 DIAGNOSIS — E119 Type 2 diabetes mellitus without complications: Secondary | ICD-10-CM | POA: Diagnosis not present

## 2016-07-31 DIAGNOSIS — R0789 Other chest pain: Secondary | ICD-10-CM | POA: Diagnosis not present

## 2016-07-31 DIAGNOSIS — J9811 Atelectasis: Secondary | ICD-10-CM | POA: Diagnosis not present

## 2016-07-31 DIAGNOSIS — R079 Chest pain, unspecified: Secondary | ICD-10-CM | POA: Diagnosis not present

## 2016-07-31 DIAGNOSIS — I110 Hypertensive heart disease with heart failure: Secondary | ICD-10-CM | POA: Diagnosis not present

## 2016-08-01 DIAGNOSIS — R079 Chest pain, unspecified: Secondary | ICD-10-CM | POA: Diagnosis not present

## 2016-08-01 DIAGNOSIS — I2510 Atherosclerotic heart disease of native coronary artery without angina pectoris: Secondary | ICD-10-CM | POA: Diagnosis not present

## 2016-08-01 DIAGNOSIS — R110 Nausea: Secondary | ICD-10-CM | POA: Diagnosis not present

## 2016-08-01 DIAGNOSIS — I10 Essential (primary) hypertension: Secondary | ICD-10-CM | POA: Diagnosis not present

## 2016-08-01 DIAGNOSIS — J449 Chronic obstructive pulmonary disease, unspecified: Secondary | ICD-10-CM | POA: Diagnosis not present

## 2016-08-01 DIAGNOSIS — I480 Paroxysmal atrial fibrillation: Secondary | ICD-10-CM | POA: Diagnosis not present

## 2016-08-02 DIAGNOSIS — J449 Chronic obstructive pulmonary disease, unspecified: Secondary | ICD-10-CM | POA: Diagnosis not present

## 2016-08-02 DIAGNOSIS — E8770 Fluid overload, unspecified: Secondary | ICD-10-CM | POA: Diagnosis not present

## 2016-08-02 DIAGNOSIS — I2510 Atherosclerotic heart disease of native coronary artery without angina pectoris: Secondary | ICD-10-CM | POA: Diagnosis not present

## 2016-08-02 DIAGNOSIS — I480 Paroxysmal atrial fibrillation: Secondary | ICD-10-CM | POA: Diagnosis not present

## 2016-08-02 DIAGNOSIS — D509 Iron deficiency anemia, unspecified: Secondary | ICD-10-CM | POA: Diagnosis not present

## 2016-08-02 DIAGNOSIS — R079 Chest pain, unspecified: Secondary | ICD-10-CM | POA: Diagnosis not present

## 2016-08-03 DIAGNOSIS — I519 Heart disease, unspecified: Secondary | ICD-10-CM | POA: Diagnosis not present

## 2016-08-03 DIAGNOSIS — I10 Essential (primary) hypertension: Secondary | ICD-10-CM | POA: Diagnosis not present

## 2016-08-03 DIAGNOSIS — I700 Atherosclerosis of aorta: Secondary | ICD-10-CM | POA: Diagnosis not present

## 2016-08-03 DIAGNOSIS — I2510 Atherosclerotic heart disease of native coronary artery without angina pectoris: Secondary | ICD-10-CM | POA: Diagnosis not present

## 2016-08-03 DIAGNOSIS — E785 Hyperlipidemia, unspecified: Secondary | ICD-10-CM | POA: Diagnosis not present

## 2016-08-03 DIAGNOSIS — R079 Chest pain, unspecified: Secondary | ICD-10-CM | POA: Diagnosis not present

## 2016-08-03 DIAGNOSIS — I517 Cardiomegaly: Secondary | ICD-10-CM | POA: Diagnosis not present

## 2016-08-03 IMAGING — CR DG CHEST 2V
1 series · 2 of 2 positions shown · non-contrast
Comparison: 05/22/2014

CLINICAL DATA: Cough, weakness, diarrhea

EXAM:
CHEST  2 VIEW

[Series 1: dxr chest pa (or ap) and lateral · 0.14mm/px · 2 of 2 slices shown]
[im 1/2]
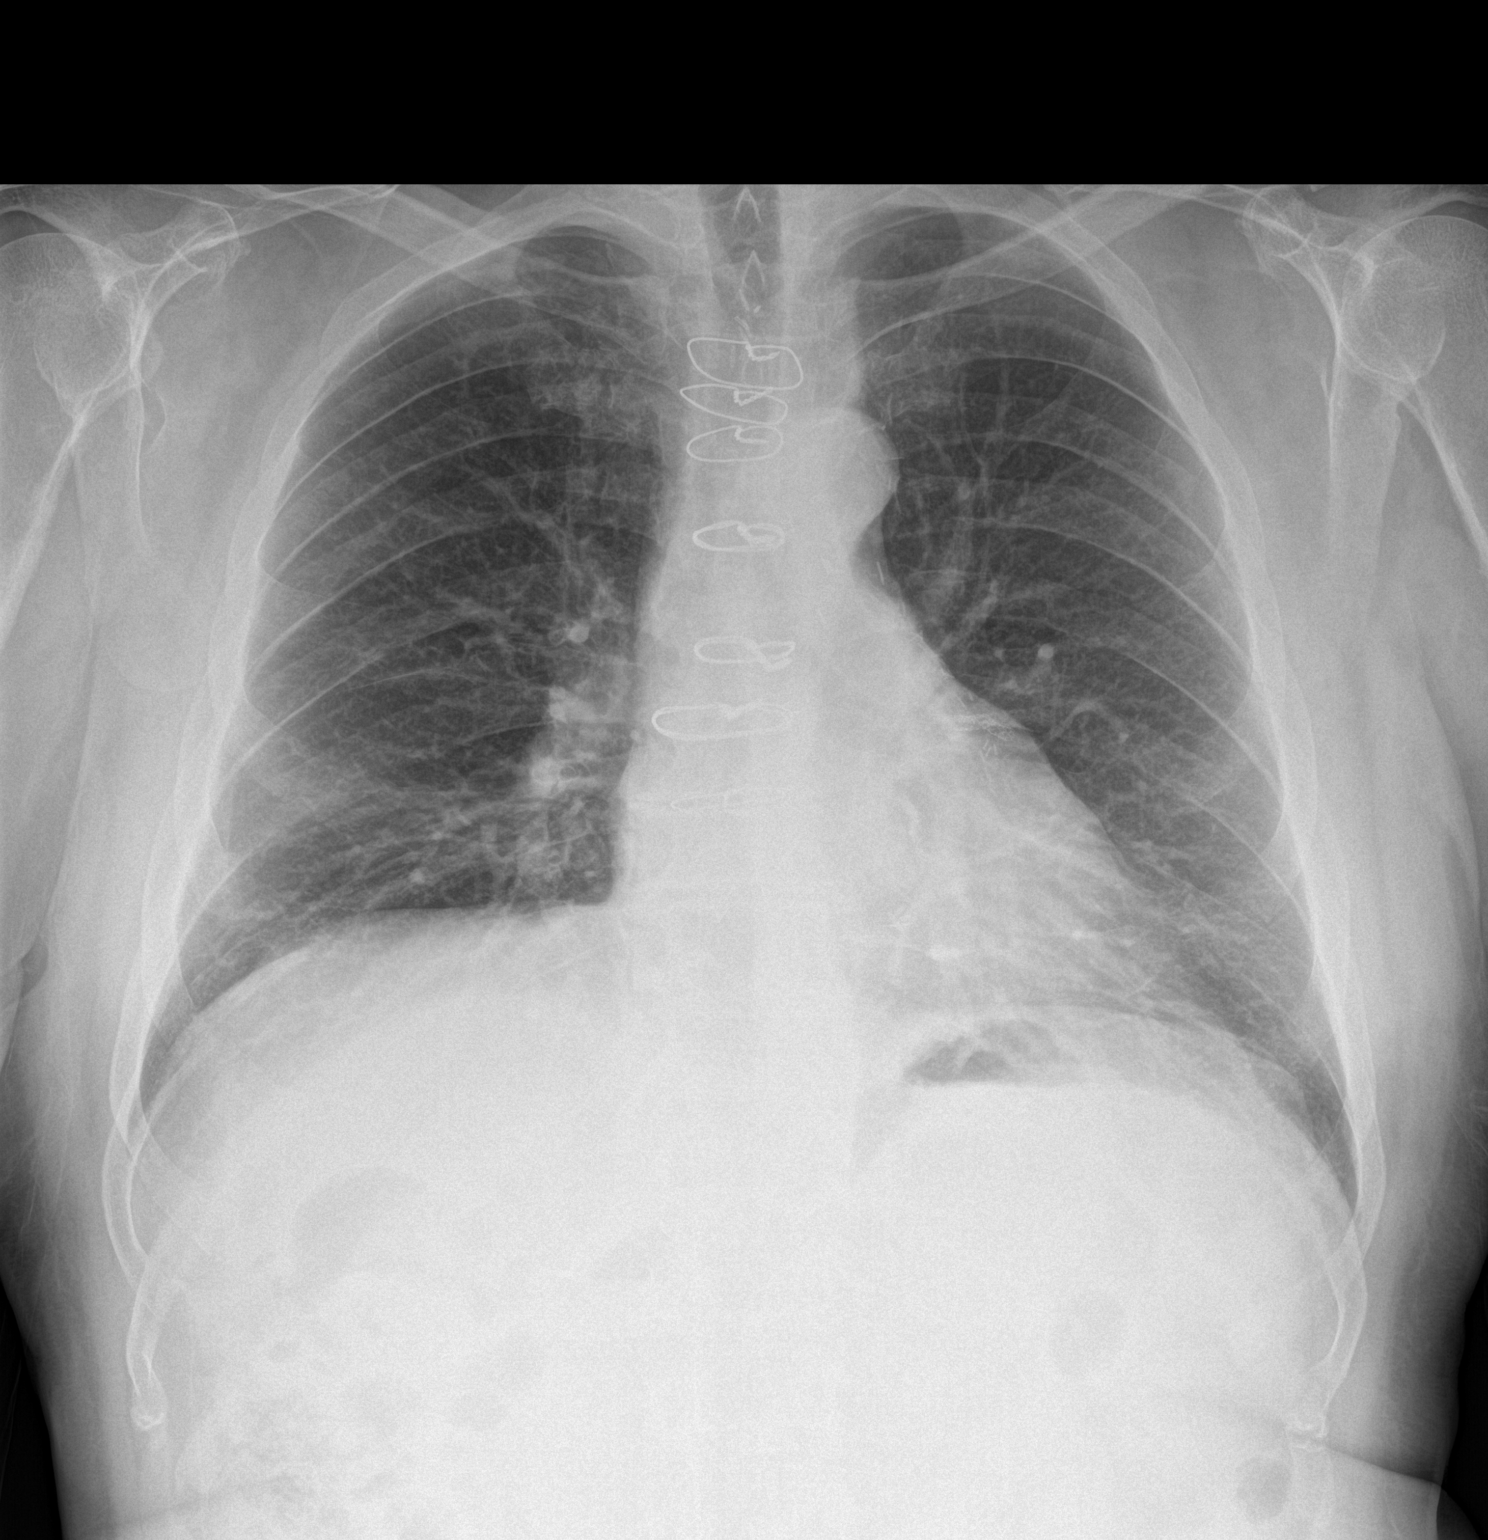
[im 2/2]
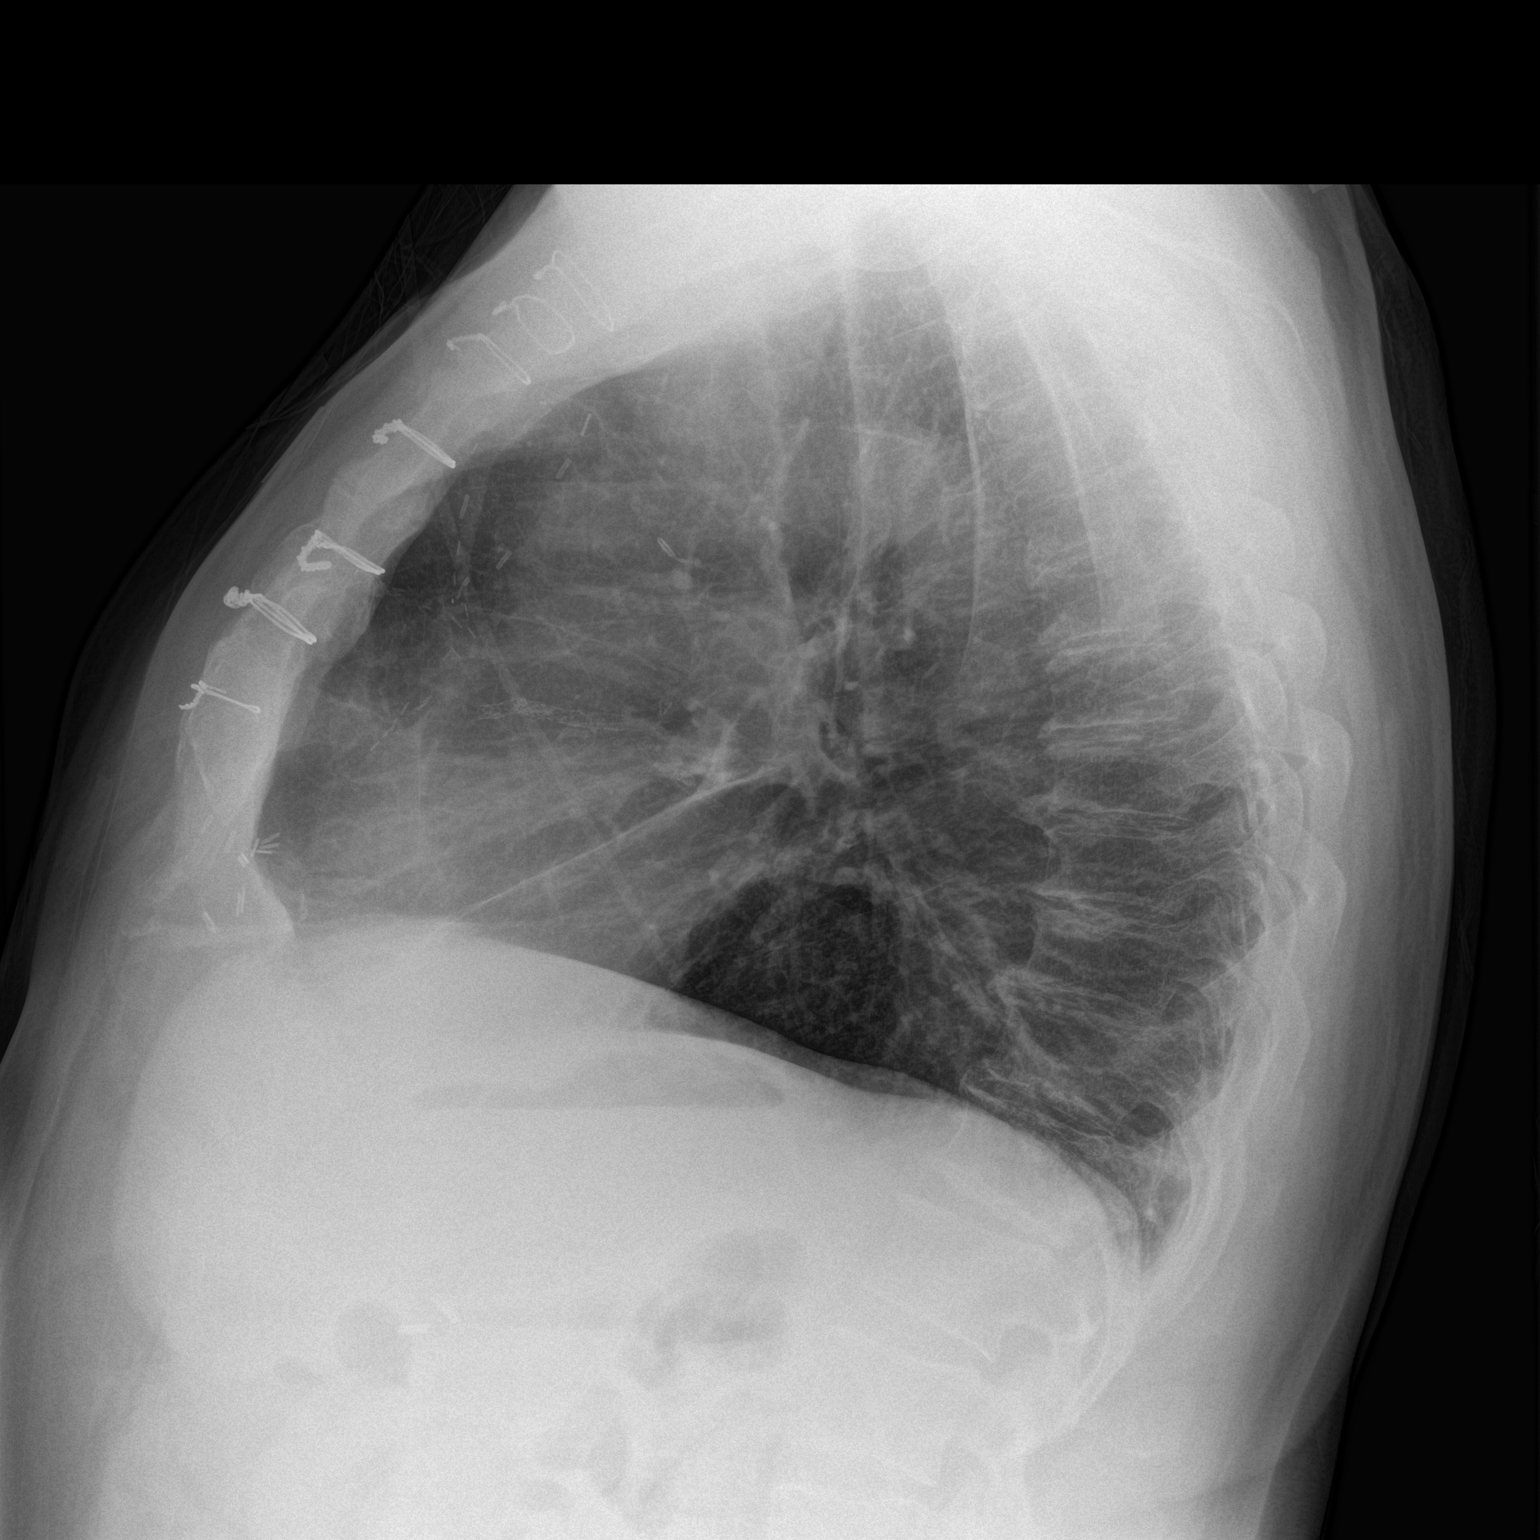

[2 of 2 positions shown; findings below may reference images not displayed]

FINDINGS: Lungs are essentially clear. No focal consolidation. No pleural
effusion or pneumothorax.

The heart is normal in size. Postsurgical changes related to prior
CABG.

Degenerative changes of the visualized thoracolumbar spine.
IMPRESSION: No evidence of acute cardiopulmonary disease.

## 2016-08-10 DIAGNOSIS — G4733 Obstructive sleep apnea (adult) (pediatric): Secondary | ICD-10-CM | POA: Diagnosis not present

## 2016-08-12 DIAGNOSIS — I10 Essential (primary) hypertension: Secondary | ICD-10-CM | POA: Diagnosis not present

## 2016-08-12 DIAGNOSIS — G4710 Hypersomnia, unspecified: Secondary | ICD-10-CM | POA: Diagnosis not present

## 2016-08-12 DIAGNOSIS — J439 Emphysema, unspecified: Secondary | ICD-10-CM | POA: Diagnosis not present

## 2016-08-12 DIAGNOSIS — J449 Chronic obstructive pulmonary disease, unspecified: Secondary | ICD-10-CM | POA: Diagnosis not present

## 2016-08-13 ENCOUNTER — Other Ambulatory Visit: Payer: Self-pay | Admitting: Family Medicine

## 2016-08-13 NOTE — Telephone Encounter (Signed)
Pt contacted office for refill request on the following medications:  oxyCODONE-acetaminophen (ROXICET) 5-325 MG tablet.  Pt states this should be 10-325.  SX#[phone]

## 2016-08-13 NOTE — Telephone Encounter (Signed)
Please advise 

## 2016-08-15 ENCOUNTER — Other Ambulatory Visit: Payer: Self-pay | Admitting: Family Medicine

## 2016-08-15 MED ORDER — OXYCODONE-ACETAMINOPHEN 5-325 MG PO TABS: 1.0000 | ORAL_TABLET | ORAL | 0 refills | Status: DC | PRN

## 2016-08-15 MED ORDER — OXYCODONE-ACETAMINOPHEN 10-325 MG PO TABS: 1.0000 | ORAL_TABLET | ORAL | 0 refills | Status: DC | PRN

## 2016-08-18 ENCOUNTER — Encounter: Payer: Self-pay | Admitting: Family Medicine

## 2016-08-18 ENCOUNTER — Ambulatory Visit (INDEPENDENT_AMBULATORY_CARE_PROVIDER_SITE_OTHER): Payer: Commercial Managed Care - HMO | Admitting: Family Medicine

## 2016-08-18 VITALS — BP 132/74 | HR 69 | Temp 97.7°F | Resp 16 | Wt 248.0 lb

## 2016-08-18 DIAGNOSIS — I200 Unstable angina: Secondary | ICD-10-CM

## 2016-08-18 DIAGNOSIS — G4733 Obstructive sleep apnea (adult) (pediatric): Secondary | ICD-10-CM

## 2016-08-18 DIAGNOSIS — I10 Essential (primary) hypertension: Secondary | ICD-10-CM | POA: Diagnosis not present

## 2016-08-18 DIAGNOSIS — K219 Gastro-esophageal reflux disease without esophagitis: Secondary | ICD-10-CM | POA: Diagnosis not present

## 2016-08-18 DIAGNOSIS — D509 Iron deficiency anemia, unspecified: Secondary | ICD-10-CM | POA: Diagnosis not present

## 2016-08-18 DIAGNOSIS — R110 Nausea: Secondary | ICD-10-CM | POA: Diagnosis not present

## 2016-08-18 DIAGNOSIS — R1012 Left upper quadrant pain: Secondary | ICD-10-CM | POA: Diagnosis not present

## 2016-08-18 DIAGNOSIS — G8929 Other chronic pain: Secondary | ICD-10-CM | POA: Diagnosis not present

## 2016-08-18 NOTE — Progress Notes (Signed)
Patient: Lawrence [name] Male    DOB: 16-Nov-1956   60 y.o.   MRN: [id number] Visit Date: 08/18/2016  Today's Provider: [name], MD   Chief Complaint  Patient presents with  . Hospitalization Follow-up   Subjective:    HPI  Follow up Hospitalization  Patient was admitted to [facility] on 07/31/2016 and discharged on 08/03/2016 He was treated for chest pain and abdominal pain. He reports good compliance with treatment. He reports this condition is Unchanged. Patient states he is still having abdominal pain. [facility] this morning and had his medications adjusted. He reports he has follow up cardiology tomorrow.   ------------------------------------------------------------------------------------  Follow up OSA States he has no energy and feels sleepy during the day. Has not been able to use CPAP being on nocturnal oxygen. States he contacted [employer] to connect supplemental oxygen to CPAP, but they have still not been out to help with this. CPAP was working well and well tolerated before having to use nocturnal oxygen.     Allergies  Allergen Reactions  . Demerol [Meperidine] Hives  . Prednisone Other (See Comments) and Hypertension    Pt states that this medication puts him in A-fib   . Sulfa Antibiotics Hives  . Albuterol Sulfate [Albuterol] Palpitations and Other (See Comments)    Pt currently uses this medication.    . Morphine Sulfate Nausea And Vomiting, Rash and Other (See Comments)    Pt states that he is only allergic to the tablet form of this medication.       Current Outpatient Prescriptions:  .  albuterol (PROVENTIL HFA;VENTOLIN HFA) 108 (90 Base) MCG/ACT inhaler, Inhale 4-6 puffs into the lungs every 4 (four) hours as needed for wheezing or shortness of breath. Reported on 04/08/2016, Disp: , Rfl:  .  allopurinol (ZYLOPRIM) 100 MG tablet, THREE (3) TABLETS TWICE DAILY, Disp: 90 tablet, Rfl: 12 .  ALPRAZolam (XANAX) 1 MG  tablet, Take 0.5-1 tablets (0.5-1 mg total) by mouth 3 (three) times daily as needed for anxiety., Disp: 90 tablet, Rfl: 3 .  amLODipine (NORVASC) 10 MG tablet, Take 10 mg by mouth daily., Disp: , Rfl:  .  aspirin 81 MG EC tablet, Take 81 mg by mouth daily. Swallow whole., Disp: , Rfl:  .  atorvastatin (LIPITOR) 80 MG tablet, TAKE 1 TABLET BY MOUTH EVERY NIGHT AT BEDTIME., Disp: 90 tablet, Rfl: 4 .  cetirizine (ZYRTEC) 10 MG tablet, TAKE ONE TABLET AT BEDTIME, Disp: 30 tablet, Rfl: 12 .  cyclobenzaprine (FLEXERIL) 10 MG tablet, TAKE 1 TABLET BY MOUTH EVERY 8 HOURS AS NEEEDED FOR MUSCLE SPASM, Disp: 30 tablet, Rfl: 5 .  dicyclomine (BENTYL) 20 MG tablet, Take 1 tablet (20 mg total) by mouth 3 (three) times daily as needed (abdominal pain)., Disp: 30 tablet, Rfl: 0 .  ELIQUIS 5 MG TABS tablet, TAKE ONE TABLET TWICE DAILY, Disp: 60 tablet, Rfl: 12 .  ferrous sulfate 325 (65 FE) MG tablet, Take 325 mg by mouth daily with breakfast., Disp: , Rfl:  .  fluticasone furoate-vilanterol (BREO ELLIPTA) 100-25 MCG/INH AEPB, Inhale 1 puff into the lungs daily. , Disp: , Rfl:  .  furosemide (LASIX) 80 MG tablet, Take 40 mg by mouth daily. , Disp: , Rfl:  .  hyoscyamine (LEVSIN SL) 0.125 MG SL tablet, Place under the tongue. Place 1 tablet (0.125 mg total) under the tongue every 4 (four) hours as needed for Cramping., Disp: , Rfl:  .  isosorbide mononitrate (IMDUR) 60 MG 24 hr tablet, Take 120 mg by mouth daily. Reported on 04/24/2016, Disp: , Rfl:  .  lansoprazole (PREVACID) 30 MG capsule, Take 30 mg by mouth 2 (two) times daily. , Disp: , Rfl:  .  lisinopril (PRINIVIL,ZESTRIL) 5 MG tablet, Take 5 mg by mouth daily., Disp: , Rfl:  .  magnesium oxide (MAG-OX) 400 (241.3 Mg) MG tablet, Take 400 mg by mouth daily., Disp: , Rfl:  .  metFORMIN (GLUCOPHAGE) 500 MG tablet, Take 500 mg by mouth daily with breakfast. , Disp: , Rfl:  .  metoprolol tartrate (LOPRESSOR) 25 MG tablet, Take 0.5 tablets (12.5 mg total) by mouth 2  (two) times daily., Disp: 30 tablet, Rfl: 0 .  nitroGLYCERIN (NITROSTAT) 0.4 MG SL tablet, Place 0.4 mg under the tongue every 5 (five) minutes as needed for chest pain. Reported on 05/26/2016, Disp: , Rfl:  .  omega-3 acid ethyl esters (LOVAZA) 1 g capsule, TAKE FOUR CAPSULES BY MOUTH DAILY, Disp: 120 capsule, Rfl: 6 .  oxyCODONE-acetaminophen (PERCOCET) 10-325 MG tablet, Take 1 tablet by mouth every 4 (four) hours as needed., Disp: 150 tablet, Rfl: 0 .  RANEXA 500 MG 12 hr tablet, TAKE TWO (2) TABLETS TWICE A DAY, Disp: 120 tablet, Rfl: 4 .  senna (SENOKOT) 8.6 MG tablet, Take 1 tablet by mouth at bedtime., Disp: , Rfl:  .  sotalol (BETAPACE) 120 MG tablet, Take 120 mg by mouth daily., Disp: , Rfl:  .  sucralfate (CARAFATE) 1 g tablet, Take 1 tablet (1 g total) by mouth 2 (two) times daily., Disp: 20 tablet, Rfl: 0 .  tamsulosin (FLOMAX) 0.4 MG CAPS capsule, Take 0.4 mg by mouth daily after breakfast. , Disp: , Rfl:  .  tiotropium (SPIRIVA) 18 MCG inhalation capsule, Place 18 mcg into inhaler and inhale daily., Disp: , Rfl:   Review of Systems  Constitutional: Positive for fatigue. Negative for appetite change, chills and fever.  Respiratory: Negative for chest tightness, shortness of breath and wheezing.   Cardiovascular: Negative for chest pain and palpitations.  Gastrointestinal: Positive for abdominal pain. Negative for nausea and vomiting.  Neurological: Positive for dizziness, weakness and light-headedness (when walking).    Social History  Substance Use Topics  . Smoking status: Former Smoker    Quit date: 04/22/2013  . Smokeless tobacco: Never Used  . Alcohol use No     Comment: remotely quit alcohol use. Hx of heavy alcohol use.   Objective:   BP 132/74 (BP Location: Right Arm, Patient Position: Sitting, Cuff Size: Large)   Pulse 69   Temp 97.7 F (36.5 C) (Oral)   Resp 16   Wt 248 lb (112.5 kg)   SpO2 95% Comment: 2 L 02 Nasal canula  BMI 38.84 kg/m   Physical  Exam   General Appearance:    Alert, cooperative, no distress  Eyes:    PERRL, conjunctiva/corneas clear, EOM's intact       Lungs:     Clear to auscultation bilaterally, respirations unlabored  Heart:    Regular rate and rhythm  Neurologic:   Awake, alert, oriented x 3. No apparent focal neurological           defect.           Assessment & Plan:     1. OSA (obstructive sleep apnea) Patient needs [employer] to help him connect nocturnal oxygen to CPAP. Will contact [place] regarding this.   2. Unstable angina (HCC) No pain since discharge  from [facility]. Follow up cardiology as scheduled tomorrow.   3. Essential hypertension Well controlled. .Continue current medications.    4. Gastroesophageal reflux disease without esophagitis Seen GI this morning and is to follow up as scheduled.   Follow up for diabetes in 2 months.        [name], MD  [facility]

## 2016-08-19 DIAGNOSIS — I10 Essential (primary) hypertension: Secondary | ICD-10-CM | POA: Diagnosis not present

## 2016-08-19 DIAGNOSIS — R0602 Shortness of breath: Secondary | ICD-10-CM | POA: Diagnosis not present

## 2016-08-19 DIAGNOSIS — I25118 Atherosclerotic heart disease of native coronary artery with other forms of angina pectoris: Secondary | ICD-10-CM | POA: Diagnosis not present

## 2016-08-19 DIAGNOSIS — E782 Mixed hyperlipidemia: Secondary | ICD-10-CM | POA: Diagnosis not present

## 2016-08-20 ENCOUNTER — Telehealth: Payer: Self-pay | Admitting: Family Medicine

## 2016-08-20 NOTE — Telephone Encounter (Signed)
Pt needs test strips Accu-chek aviva+  He uses [place].  His call back is [phone]  Thanks Con Memos

## 2016-08-21 MED ORDER — GLUCOSE BLOOD VI STRP: ORAL_STRIP | 4 refills | Status: DC

## 2016-08-21 NOTE — Telephone Encounter (Signed)
Please advise 

## 2016-08-21 NOTE — Telephone Encounter (Signed)
Please review. Thanks!  

## 2016-08-25 ENCOUNTER — Encounter: Payer: Self-pay | Admitting: Emergency Medicine

## 2016-08-25 ENCOUNTER — Emergency Department: Payer: Commercial Managed Care - HMO

## 2016-08-25 ENCOUNTER — Emergency Department
Admission: EM | Admit: 2016-08-25 | Discharge: 2016-08-25 | Disposition: A | Discharge status: Home / Self Care | Payer: Commercial Managed Care - HMO | Attending: Student | Admitting: Student

## 2016-08-25 DIAGNOSIS — N189 Chronic kidney disease, unspecified: Secondary | ICD-10-CM | POA: Diagnosis not present

## 2016-08-25 DIAGNOSIS — I2510 Atherosclerotic heart disease of native coronary artery without angina pectoris: Secondary | ICD-10-CM | POA: Diagnosis not present

## 2016-08-25 DIAGNOSIS — Z7982 Long term (current) use of aspirin: Secondary | ICD-10-CM | POA: Diagnosis not present

## 2016-08-25 DIAGNOSIS — Z7984 Long term (current) use of oral hypoglycemic drugs: Secondary | ICD-10-CM | POA: Insufficient documentation

## 2016-08-25 DIAGNOSIS — R109 Unspecified abdominal pain: Secondary | ICD-10-CM

## 2016-08-25 DIAGNOSIS — J45909 Unspecified asthma, uncomplicated: Secondary | ICD-10-CM | POA: Diagnosis not present

## 2016-08-25 DIAGNOSIS — E1122 Type 2 diabetes mellitus with diabetic chronic kidney disease: Secondary | ICD-10-CM | POA: Diagnosis not present

## 2016-08-25 DIAGNOSIS — J449 Chronic obstructive pulmonary disease, unspecified: Secondary | ICD-10-CM | POA: Insufficient documentation

## 2016-08-25 DIAGNOSIS — R918 Other nonspecific abnormal finding of lung field: Secondary | ICD-10-CM | POA: Diagnosis not present

## 2016-08-25 DIAGNOSIS — R1084 Generalized abdominal pain: Secondary | ICD-10-CM | POA: Diagnosis not present

## 2016-08-25 DIAGNOSIS — I130 Hypertensive heart and chronic kidney disease with heart failure and stage 1 through stage 4 chronic kidney disease, or unspecified chronic kidney disease: Secondary | ICD-10-CM | POA: Insufficient documentation

## 2016-08-25 DIAGNOSIS — I5032 Chronic diastolic (congestive) heart failure: Secondary | ICD-10-CM | POA: Insufficient documentation

## 2016-08-25 DIAGNOSIS — Z87891 Personal history of nicotine dependence: Secondary | ICD-10-CM | POA: Diagnosis not present

## 2016-08-25 DIAGNOSIS — R079 Chest pain, unspecified: Secondary | ICD-10-CM | POA: Diagnosis not present

## 2016-08-25 LAB — TROPONIN I
Troponin I: <0.03 ng/mL (ref <0.03)
Troponin I: <0.03 ng/mL (ref <0.03)

## 2016-08-25 LAB — BASIC METABOLIC PANEL
Anion gap: 10 (ref 5–15)
BUN: 18 mg/dL (ref 6–20)
CO2: 33 mmol/L — ABNORMAL HIGH (ref 22–32)
Calcium: 9.5 mg/dL (ref 8.9–10.3)
Chloride: 94 mmol/L — ABNORMAL LOW (ref 101–111)
Creatinine, Ser: 1.21 mg/dL (ref 0.61–1.24)
GFR calc Af Amer: >60 mL/min (ref >60)
GFR calc non Af Amer: >60 mL/min (ref >60)
Glucose, Bld: 123 mg/dL — ABNORMAL HIGH (ref 65–99)
Potassium: 3.5 mmol/L (ref 3.5–5.1)
Sodium: 137 mmol/L (ref 135–145)

## 2016-08-25 LAB — HEPATIC FUNCTION PANEL
ALT: 21 U/L (ref 17–63)
AST: 27 U/L (ref 15–41)
Albumin: 4 g/dL (ref 3.5–5.0)
Alkaline Phosphatase: 82 U/L (ref 38–126)
Bilirubin, Direct: 0.2 mg/dL (ref 0.1–0.5)
Indirect Bilirubin: 0.4 mg/dL (ref 0.3–0.9)
Total Bilirubin: 0.6 mg/dL (ref 0.3–1.2)
Total Protein: 7.5 g/dL (ref 6.5–8.1)

## 2016-08-25 LAB — CBC
HCT: 49.7 % (ref 40.0–52.0)
Hemoglobin: 16 g/dL (ref 13.0–18.0)
MCH: 26 pg (ref 26.0–34.0)
MCHC: 32.3 g/dL (ref 32.0–36.0)
MCV: 80.7 fL (ref 80.0–100.0)
Platelets: 220 10*3/uL (ref 150–440)
RBC: 6.15 MIL/uL — ABNORMAL HIGH (ref 4.40–5.90)
RDW: 20.9 % — ABNORMAL HIGH (ref 11.5–14.5)
WBC: 7.2 10*3/uL (ref 3.8–10.6)

## 2016-08-25 LAB — URINALYSIS COMPLETE WITH MICROSCOPIC (ARMC ONLY)
Bacteria, UA: NONE SEEN
Bilirubin Urine: NEGATIVE
Glucose, UA: NEGATIVE mg/dL
Hgb urine dipstick: NEGATIVE
Ketones, ur: NEGATIVE mg/dL
Leukocytes, UA: NEGATIVE
Nitrite: NEGATIVE
Protein, ur: NEGATIVE mg/dL
Specific Gravity, Urine: 1.009 (ref 1.005–1.030)
Squamous Epithelial / LPF: NONE SEEN
pH: 5 (ref 5.0–8.0)

## 2016-08-25 LAB — LIPASE, BLOOD: Lipase: 28 U/L (ref 11–51)

## 2016-08-25 LAB — BRAIN NATRIURETIC PEPTIDE: B Natriuretic Peptide: 90 pg/mL (ref 0.0–100.0)

## 2016-08-25 MED ORDER — MORPHINE SULFATE (PF) 4 MG/ML IV SOLN
4.0000 mg | Freq: Once | INTRAVENOUS | Status: AC
  Administered 2016-08-25: 4 mg via INTRAVENOUS
  Filled 2016-08-25: qty 1

## 2016-08-25 MED ORDER — ONDANSETRON HCL 4 MG/2ML IJ SOLN
4.0000 mg | Freq: Once | INTRAMUSCULAR | Status: AC
  Administered 2016-08-25: 4 mg via INTRAVENOUS
  Filled 2016-08-25: qty 2

## 2016-08-25 NOTE — ED Notes (Signed)
Given ginger ale. Ok per dr Edd Fabian

## 2016-08-25 NOTE — ED Provider Notes (Addendum)
[facility] Emergency Department Provider Note   ____________________________________________   First MD Initiated Contact with Patient 08/25/16 1722     (approximate)  I have reviewed the triage vital signs and the nursing notes.   HISTORY  Chief Complaint Chest Pain; Flank Pain; and Hyperglycemia    HPI Lawrence [name] is a 60 y.o. male with male history of CAD status post CABG, COPD with chronic home O2 requirement, CHF, atrial fibrillation on elliquis, diabetes who presents for evaluation of central chest pain, diffuse abdominal pain and back pain/"kidney pain" ongoing today, gradual onset, constant, moderate, no modifying factors. Patient reports that this does feel similar to the chronic pain he typically experiences however his chest pain today is "a little worse" than it usually is. The chest pain is not ripping or tearing in nature, does not radiate, is not worse with exertion and has been constant since this morning. He has had nausea but denies any vomiting or diarrhea, no fevers or chills. He is often seen in this emergency Department for chest pain, abdominal pain and/or back pain. He is followed at the chronic pain clinic and reports that he has been taking his narcotics with little improvement in his symptoms. He says that his blood sugars have been running in the 200s which is high for him. He took full dose aspirin prior to arrival.   Past Medical History:  Diagnosis Date  . A-fib ([place])   . Anemia   . Anxiety   . Asthma   . CAD (coronary artery disease)    a. 2002 CABGx2 (LIMA->LAD, VG->VG->OM1);  b. 09/2012 DES->OM;  c. 03/2015 PTCA of LAD [facility]) in setting of atretic LIMA; d. 05/2015 Cath [facility]): nonobs dzs; e. 06/2015 Cath ([facility]): LM nl, LAD 45p/d ISR, 50d, D1/2 small, LCX 50p/d ISR, OM1 70ost, 30 ISR, VG->OM1 50ost, 12m LIMA->LAD 99p/d - atretic, RCA dom, nl; f.cath 10/16: 40-50%(FFR 0.90) pLAD, 75% (FFR 0.77) mLAD s/p PCI/DES, oRCA 40%  (FFR0.95)  . Celiac disease   . Chronic diastolic CHF (congestive heart failure) ([place]    a. 06/2009 Echo: EF 60-65%, Gr 1 DD, triv AI, mildly dil LA, nl RV.  .Marland KitchenCOPD (chronic obstructive pulmonary disease) ([place]    a. Chronic bronchitis and emphysema.  .Marland KitchenDysrhythmia   . Essential hypertension   . History of tobacco abuse    a. Quit 2014.  .Marland KitchenPSVT (paroxysmal supraventricular tachycardia) ([place]    a. 10/2012 Noted on Zio Patch.  . Type II diabetes mellitus ([facility]     Patient Active Problem List   Diagnosis Date Noted  . Dyspnea 04/03/2016  . Hypotension 04/03/2016  . Chronic kidney disease 04/03/2016  . Anemia 04/03/2016  . Paroxysmal atrial fibrillation ([place] 12/23/2015  . OSA (obstructive sleep apnea) 12/10/2015  . Left inguinal hernia 11/07/2015  . Anxiety 11/07/2015  . Unstable angina ([place] 10/05/2015  . PAF (paroxysmal atrial fibrillation) ([place] 10/05/2015  . Back pain with left-sided radiculopathy 09/30/2015  . Nocturnal hypoxia 09/06/2015  . BPH (benign prostatic hyperplasia) 08/01/2015  . Chronic diastolic CHF (congestive heart failure) ([place]   . Angina pectoris ([place]   . Chest pain 07/11/2015  . COPD (chronic obstructive pulmonary disease) ([place] 07/03/2015  . CAD (coronary artery disease) 06/26/2015  . HTN (hypertension) 06/26/2015  . DM (diabetes mellitus) ([place] 06/26/2015  . Achalasia 07/24/2014  . GERD (gastroesophageal reflux disease) 06/07/2014  . Former tobacco use 04/11/2013  . HLD (hyperlipidemia) 04/09/2013    Past Surgical History:  Procedure Laterality Date  . BYPASS GRAFT    . CARDIAC CATHETERIZATION N/A 07/12/2015   rocedure: Left Heart Cath and Cors/Grafts Angiography;  Surgeon: Belva Crome, MD;  Location: [place] CV LAB;  Service: Cardiovascular;  Laterality: N/A;  . CARDIAC CATHETERIZATION Right 10/07/2015   Procedure: Left Heart Cath and Cors/Grafts Angiography;  Surgeon: Dionisio David, MD;  Location: [place] CV LAB;  Service:  Cardiovascular;  Laterality: Right;  . CARDIAC CATHETERIZATION N/A 04/06/2016   Procedure: Left Heart Cath and Coronary Angiography;  Surgeon: Yolonda Kida, MD;  Location: [place] CV LAB;  Service: Cardiovascular;  Laterality: N/A;  . CARDIAC CATHETERIZATION  04/06/2016   Procedure: Bypass Graft Angiography;  Surgeon: Yolonda Kida, MD;  Location: [place] CV LAB;  Service: Cardiovascular;;  . CHOLECYSTECTOMY    . ESOPHAGEAL DILATION    . TONSILLECTOMY    . VASCULAR SURGERY      Prior to Admission medications   Medication Sig Start Date End Date Taking? Authorizing Provider  albuterol (PROVENTIL HFA;VENTOLIN HFA) 108 (90 Base) MCG/ACT inhaler Inhale 4-6 puffs into the lungs every 4 (four) hours as needed for wheezing or shortness of breath. Reported on 04/08/2016    Historical Provider, MD  allopurinol (ZYLOPRIM) 100 MG tablet THREE (3) TABLETS TWICE DAILY 04/25/16   Birdie Sons, MD  ALPRAZolam Duanne Moron) 1 MG tablet Take 0.5-1 tablets (0.5-1 mg total) by mouth 3 (three) times daily as needed for anxiety. 05/26/16   Birdie Sons, MD  amLODipine (NORVASC) 10 MG tablet Take 10 mg by mouth daily.    Historical Provider, MD  aspirin 81 MG EC tablet Take 81 mg by mouth daily. Swallow whole.    Historical Provider, MD  atorvastatin (LIPITOR) 80 MG tablet TAKE 1 TABLET BY MOUTH EVERY NIGHT AT BEDTIME. 05/26/16   Birdie Sons, MD  cetirizine (ZYRTEC) 10 MG tablet TAKE ONE TABLET AT BEDTIME 04/19/16   Birdie Sons, MD  cyclobenzaprine (FLEXERIL) 10 MG tablet TAKE 1 TABLET BY MOUTH EVERY 8 HOURS AS NEEEDED FOR MUSCLE SPASM 07/25/16   Birdie Sons, MD  dicyclomine (BENTYL) 20 MG tablet Take 1 tablet (20 mg total) by mouth 3 (three) times daily as needed (abdominal pain). 04/24/16   Nance Pear, MD  ELIQUIS 5 MG TABS tablet TAKE ONE TABLET TWICE DAILY 07/25/16   Birdie Sons, MD  ferrous sulfate 325 (65 FE) MG tablet Take 325 mg by mouth daily with breakfast. 08/03/16 08/03/17   Historical Provider, MD  fluticasone furoate-vilanterol (BREO ELLIPTA) 100-25 MCG/INH AEPB Inhale 1 puff into the lungs daily.     Historical Provider, MD  furosemide (LASIX) 80 MG tablet Take 40 mg by mouth daily.     Historical Provider, MD  glucose blood (ACCU-CHEK AVIVA PLUS) test strip Use to check blood sugar once a day 08/21/16   Birdie Sons, MD  hyoscyamine (LEVSIN SL) 0.125 MG SL tablet Place under the tongue. Place 1 tablet (0.125 mg total) under the tongue every 4 (four) hours as needed for Cramping. 08/18/16 10/17/16  Historical Provider, MD  isosorbide mononitrate (IMDUR) 60 MG 24 hr tablet Take 120 mg by mouth daily. Reported on 04/24/2016    Historical Provider, MD  lansoprazole (PREVACID) 30 MG capsule Take 30 mg by mouth 2 (two) times daily.     Historical Provider, MD  lisinopril (PRINIVIL,ZESTRIL) 5 MG tablet Take 5 mg by mouth daily.    Historical Provider, MD  magnesium oxide (MAG-OX)  400 (241.3 Mg) MG tablet Take 400 mg by mouth daily.    Historical Provider, MD  metFORMIN (GLUCOPHAGE) 500 MG tablet Take 500 mg by mouth daily with breakfast.     Historical Provider, MD  metoprolol tartrate (LOPRESSOR) 25 MG tablet Take 0.5 tablets (12.5 mg total) by mouth 2 (two) times daily. 06/26/16   Epifanio Lesches, MD  nitroGLYCERIN (NITROSTAT) 0.4 MG SL tablet Place 0.4 mg under the tongue every 5 (five) minutes as needed for chest pain. Reported on 05/26/2016    Historical Provider, MD  omega-3 acid ethyl esters (LOVAZA) 1 g capsule TAKE FOUR CAPSULES BY MOUTH DAILY 07/25/16   Birdie Sons, MD  oxyCODONE-acetaminophen (PERCOCET) 10-325 MG tablet Take 1 tablet by mouth every 4 (four) hours as needed. 08/15/16   Birdie Sons, MD  RANEXA 500 MG 12 hr tablet TAKE TWO (2) TABLETS TWICE A DAY 07/25/16   Birdie Sons, MD  senna (SENOKOT) 8.6 MG tablet Take 1 tablet by mouth at bedtime. 08/03/16 09/02/16  Historical Provider, MD  sotalol (BETAPACE) 120 MG tablet Take 120 mg by mouth daily.     Historical Provider, MD  sucralfate (CARAFATE) 1 g tablet Take 1 tablet (1 g total) by mouth 2 (two) times daily. 04/22/16   [name], MD  tamsulosin (FLOMAX) 0.4 MG CAPS capsule Take 0.4 mg by mouth daily after breakfast.     Historical Provider, MD  tiotropium (SPIRIVA) 18 MCG inhalation capsule Place 18 mcg into inhaler and inhale daily.    Historical Provider, MD    Allergies Demerol [meperidine]; Prednisone; Sulfa antibiotics; Albuterol sulfate [albuterol]; and Morphine sulfate  Family History  Problem Relation Age of Onset  . Heart attack Mother   . Depression Mother   . Heart disease Mother   . COPD Mother   . Hypertension Mother   . Heart attack Father   . Diabetes Father   . Depression Father   . Heart disease Father   . Cirrhosis Father   . Parkinson's disease Brother     Social History Social History  Substance Use Topics  . Smoking status: Former Smoker    Quit date: 04/22/2013  . Smokeless tobacco: Never Used  . Alcohol use No     Comment: remotely quit alcohol use. Hx of heavy alcohol use.    Review of Systems Constitutional: No fever/chills Eyes: No visual changes. ENT: No sore throat. Cardiovascular: + chest pain. Respiratory: +chronic shortness of breath. Gastrointestinal: No abdominal pain.  No nausea, no vomiting.  No diarrhea.  No constipation. Genitourinary: Negative for dysuria. Musculoskeletal: Positive for chronic back pain. Skin: Negative for rash. Neurological: Negative for headaches, focal weakness or numbness.  10-point ROS otherwise negative.  ____________________________________________   PHYSICAL EXAM:  VITAL SIGNS: ED Triage Vitals  Enc Vitals Group     BP 08/25/16 1640 127/78     Pulse Rate 08/25/16 1640 71     Resp 08/25/16 1640 20     Temp 08/25/16 1640 97.5 F (36.4 C)     Temp Source 08/25/16 1640 Oral     SpO2 08/25/16 1640 97 %     Weight 08/25/16 1641 244 lb (110.7 kg)     Height 08/25/16 1641 5' 7"  (1.702 m)      Head Circumference --      Peak Flow --      Pain Score 08/25/16 1641 8     Pain Loc --      Pain Edu? --  Excl. in [place]? --     Constitutional: Alert and oriented. Nontoxic-appearing and in no acute distress. Eyes: Conjunctivae are normal. PERRL. EOMI. Head: Atraumatic. Nose: No congestion/rhinnorhea. Mouth/Throat: Mucous membranes are moist.  Oropharynx non-erythematous. Neck: No stridor.   Cardiovascular: Normal rate, regular rhythm. Grossly normal heart sounds.  Good peripheral circulation. Respiratory: Normal respiratory effort.  No retractions. Lungs CTAB. Gastrointestinal: Soft and nontender. No distention.  No CVA tenderness. Genitourinary: deferred Musculoskeletal: No lower extremity tenderness nor edema.  No joint effusions. Neurologic:  Normal speech and language. No gross focal neurologic deficits are appreciated. No gait instability. Skin:  Skin is warm, dry and intact. No rash noted. Psychiatric: Mood and affect are normal. Speech and behavior are normal.  ____________________________________________   LABS (all labs ordered are listed, but only abnormal results are displayed)  Labs Reviewed  BASIC METABOLIC PANEL - Abnormal; Notable for the following:       Result Value   Chloride 94 (*)    CO2 33 (*)    Glucose, Bld 123 (*)    All other components within normal limits  CBC - Abnormal; Notable for the following:    RBC 6.15 (*)    RDW 20.9 (*)    All other components within normal limits  URINALYSIS COMPLETEWITH MICROSCOPIC ([facility] ONLY) - Abnormal; Notable for the following:    Color, Urine YELLOW (*)    APPearance CLEAR (*)    All other components within normal limits  TROPONIN I  BRAIN NATRIURETIC PEPTIDE  HEPATIC FUNCTION PANEL  TROPONIN I  LIPASE, BLOOD   ____________________________________________  EKG  ED ECG REPORT I, Joanne Gavel, the attending physician, personally viewed and interpreted this ECG.   Date: 08/25/2016  EKG Time:  16:41  Rate: 72  Rhythm: normal EKG, normal sinus rhythm  Axis: normal  Intervals:none  ST&T Change: No acute ST elevation or acute ST depression.   ____________________________________________  RADIOLOGY  CXR IMPRESSION:  Chronic lung changes without evidence of superimposed acute  cardiopulmonary disease.    Surgical changes of prior median sternotomy and CABG. Coronary  stents in place      ____________________________________________   PROCEDURES  Procedure(s) performed: None  Procedures  Critical Care performed: No  ____________________________________________   INITIAL IMPRESSION / ASSESSMENT AND PLAN / ED COURSE  Pertinent labs & imaging results that were available during my care of the patient were reviewed by me and considered in my medical decision making (see chart for details).  Lawrence [name] is a 60 y.o. male with male history of CAD status post CABG, COPD with chronic home O2 requirement, CHF, atrial fibrillation on elliquis, diabetes who presents for evaluation of central chest pain, diffuse abdominal pain and back pain/"kidney pain" ongoing today. On exam, he is nontoxic appearing and in no acute distress. Vital signs stable and he is afebrile. Breath sounds clear. He has a benign abdominal examination without tenderness, normal bowel sounds, no rebound or guarding. I suspect that this represents an exacerbation of his chronic pain symptoms however we'll obtain screening abdominal pain labs, 2 sets of cardiac enzymes, chest x-ray and reassess for disposition. His EKG is normal, very reassuring. Pain is not ripping or tearing in nature, does not radiate to the back or down towards the feet I doubt acute aortic dissection. Pain is not pleuritic, no tachypnea or increased breathing, no new oxygen requirement, he does not have any increased shortness of breath from baseline, I doubt PE.  ----------------------------------------- 9:17 PM on  08/25/2016 ----------------------------------------- Patient reports improvement of his pain after IV morphine. Vital signs remained stable and he appears comfortable. CBC, CMP and lipase are unremarkable. BNP is not elevated. 2 sets of cardiac enzymes are also negative. UA not consistent with infection, no significant hematuria to suggest ureteral stone. Not consistent with ACS. We discussed return precautions, need for close PCP and cardiology follow-up as well as need for close follow-up with his pain clinic. He voices understanding and is comfortable with the discharge plan. DC home.  Clinical Course     ____________________________________________   FINAL CLINICAL IMPRESSION(S) / ED DIAGNOSES  Final diagnoses:  Chest pain, unspecified type  Generalized abdominal pain  Flank pain      NEW MEDICATIONS STARTED DURING THIS VISIT:  New Prescriptions   No medications on file     Note:  This document was prepared using Dragon voice recognition software and may include unintentional dictation errors.    Joanne Gavel, MD 08/25/16 2121    Joanne Gavel, MD 08/25/16 2122

## 2016-08-25 NOTE — ED Notes (Signed)
Patient contacted his son to come get him and bring his oxygen.

## 2016-08-25 NOTE — ED Triage Notes (Signed)
Pt reports bilateral flank pain x2 days, centralized chest pain that started today, radiates into left shoulder and upper arm. Pt also reports hyperglycemia, CBG has been in 230s.

## 2016-09-02 ENCOUNTER — Emergency Department: Payer: Commercial Managed Care - HMO

## 2016-09-02 ENCOUNTER — Encounter: Payer: Self-pay | Admitting: Emergency Medicine

## 2016-09-02 ENCOUNTER — Observation Stay
Admission: EM | Admit: 2016-09-02 | Discharge: 2016-09-03 | Disposition: A | Discharge status: Home / Self Care | Payer: Commercial Managed Care - HMO | Attending: Internal Medicine | Admitting: Internal Medicine

## 2016-09-02 DIAGNOSIS — N189 Chronic kidney disease, unspecified: Secondary | ICD-10-CM | POA: Diagnosis not present

## 2016-09-02 DIAGNOSIS — Z955 Presence of coronary angioplasty implant and graft: Secondary | ICD-10-CM | POA: Insufficient documentation

## 2016-09-02 DIAGNOSIS — I5032 Chronic diastolic (congestive) heart failure: Secondary | ICD-10-CM | POA: Diagnosis not present

## 2016-09-02 DIAGNOSIS — Z9049 Acquired absence of other specified parts of digestive tract: Secondary | ICD-10-CM | POA: Insufficient documentation

## 2016-09-02 DIAGNOSIS — Z888 Allergy status to other drugs, medicaments and biological substances status: Secondary | ICD-10-CM | POA: Insufficient documentation

## 2016-09-02 DIAGNOSIS — I10 Essential (primary) hypertension: Secondary | ICD-10-CM | POA: Diagnosis present

## 2016-09-02 DIAGNOSIS — I252 Old myocardial infarction: Secondary | ICD-10-CM | POA: Diagnosis not present

## 2016-09-02 DIAGNOSIS — I2510 Atherosclerotic heart disease of native coronary artery without angina pectoris: Secondary | ICD-10-CM | POA: Diagnosis present

## 2016-09-02 DIAGNOSIS — J449 Chronic obstructive pulmonary disease, unspecified: Secondary | ICD-10-CM | POA: Diagnosis present

## 2016-09-02 DIAGNOSIS — Z79899 Other long term (current) drug therapy: Secondary | ICD-10-CM | POA: Diagnosis not present

## 2016-09-02 DIAGNOSIS — R0902 Hypoxemia: Secondary | ICD-10-CM | POA: Diagnosis not present

## 2016-09-02 DIAGNOSIS — I471 Supraventricular tachycardia: Secondary | ICD-10-CM | POA: Diagnosis not present

## 2016-09-02 DIAGNOSIS — G4733 Obstructive sleep apnea (adult) (pediatric): Secondary | ICD-10-CM | POA: Diagnosis not present

## 2016-09-02 DIAGNOSIS — Z882 Allergy status to sulfonamides status: Secondary | ICD-10-CM | POA: Insufficient documentation

## 2016-09-02 DIAGNOSIS — I5022 Chronic systolic (congestive) heart failure: Secondary | ICD-10-CM | POA: Diagnosis present

## 2016-09-02 DIAGNOSIS — Z7901 Long term (current) use of anticoagulants: Secondary | ICD-10-CM | POA: Insufficient documentation

## 2016-09-02 DIAGNOSIS — K900 Celiac disease: Secondary | ICD-10-CM | POA: Insufficient documentation

## 2016-09-02 DIAGNOSIS — Z818 Family history of other mental and behavioral disorders: Secondary | ICD-10-CM | POA: Insufficient documentation

## 2016-09-02 DIAGNOSIS — Z7951 Long term (current) use of inhaled steroids: Secondary | ICD-10-CM | POA: Insufficient documentation

## 2016-09-02 DIAGNOSIS — I491 Atrial premature depolarization: Secondary | ICD-10-CM | POA: Insufficient documentation

## 2016-09-02 DIAGNOSIS — K219 Gastro-esophageal reflux disease without esophagitis: Secondary | ICD-10-CM | POA: Diagnosis not present

## 2016-09-02 DIAGNOSIS — Z8379 Family history of other diseases of the digestive system: Secondary | ICD-10-CM | POA: Insufficient documentation

## 2016-09-02 DIAGNOSIS — F419 Anxiety disorder, unspecified: Secondary | ICD-10-CM | POA: Diagnosis not present

## 2016-09-02 DIAGNOSIS — E1122 Type 2 diabetes mellitus with diabetic chronic kidney disease: Secondary | ICD-10-CM | POA: Diagnosis not present

## 2016-09-02 DIAGNOSIS — Z87891 Personal history of nicotine dependence: Secondary | ICD-10-CM | POA: Diagnosis not present

## 2016-09-02 DIAGNOSIS — Z885 Allergy status to narcotic agent status: Secondary | ICD-10-CM | POA: Insufficient documentation

## 2016-09-02 DIAGNOSIS — Z9889 Other specified postprocedural states: Secondary | ICD-10-CM | POA: Insufficient documentation

## 2016-09-02 DIAGNOSIS — I25110 Atherosclerotic heart disease of native coronary artery with unstable angina pectoris: Principal | ICD-10-CM | POA: Insufficient documentation

## 2016-09-02 DIAGNOSIS — D649 Anemia, unspecified: Secondary | ICD-10-CM | POA: Diagnosis not present

## 2016-09-02 DIAGNOSIS — I480 Paroxysmal atrial fibrillation: Secondary | ICD-10-CM | POA: Diagnosis not present

## 2016-09-02 DIAGNOSIS — I130 Hypertensive heart and chronic kidney disease with heart failure and stage 1 through stage 4 chronic kidney disease, or unspecified chronic kidney disease: Secondary | ICD-10-CM | POA: Insufficient documentation

## 2016-09-02 DIAGNOSIS — Z825 Family history of asthma and other chronic lower respiratory diseases: Secondary | ICD-10-CM | POA: Insufficient documentation

## 2016-09-02 DIAGNOSIS — R079 Chest pain, unspecified: Secondary | ICD-10-CM | POA: Diagnosis not present

## 2016-09-02 DIAGNOSIS — I700 Atherosclerosis of aorta: Secondary | ICD-10-CM | POA: Diagnosis not present

## 2016-09-02 DIAGNOSIS — Z951 Presence of aortocoronary bypass graft: Secondary | ICD-10-CM | POA: Insufficient documentation

## 2016-09-02 DIAGNOSIS — I200 Unstable angina: Secondary | ICD-10-CM | POA: Diagnosis present

## 2016-09-02 DIAGNOSIS — R0602 Shortness of breath: Secondary | ICD-10-CM | POA: Diagnosis not present

## 2016-09-02 DIAGNOSIS — Z8249 Family history of ischemic heart disease and other diseases of the circulatory system: Secondary | ICD-10-CM | POA: Insufficient documentation

## 2016-09-02 DIAGNOSIS — Z7982 Long term (current) use of aspirin: Secondary | ICD-10-CM | POA: Diagnosis not present

## 2016-09-02 DIAGNOSIS — I5023 Acute on chronic systolic (congestive) heart failure: Secondary | ICD-10-CM | POA: Diagnosis present

## 2016-09-02 DIAGNOSIS — J439 Emphysema, unspecified: Secondary | ICD-10-CM | POA: Insufficient documentation

## 2016-09-02 DIAGNOSIS — E1121 Type 2 diabetes mellitus with diabetic nephropathy: Secondary | ICD-10-CM

## 2016-09-02 DIAGNOSIS — I6522 Occlusion and stenosis of left carotid artery: Secondary | ICD-10-CM | POA: Diagnosis not present

## 2016-09-02 DIAGNOSIS — J9611 Chronic respiratory failure with hypoxia: Secondary | ICD-10-CM | POA: Diagnosis not present

## 2016-09-02 LAB — COMPREHENSIVE METABOLIC PANEL
ALT: 22 U/L (ref 17–63)
AST: 27 U/L (ref 15–41)
Albumin: 4 g/dL (ref 3.5–5.0)
Alkaline Phosphatase: 72 U/L (ref 38–126)
Anion gap: 7 (ref 5–15)
BUN: 13 mg/dL (ref 6–20)
CO2: 36 mmol/L — ABNORMAL HIGH (ref 22–32)
Calcium: 9.3 mg/dL (ref 8.9–10.3)
Chloride: 96 mmol/L — ABNORMAL LOW (ref 101–111)
Creatinine, Ser: 1.31 mg/dL — ABNORMAL HIGH (ref 0.61–1.24)
GFR calc Af Amer: >60 mL/min (ref >60)
GFR calc non Af Amer: 57 mL/min — ABNORMAL LOW (ref >60)
Glucose, Bld: 153 mg/dL — ABNORMAL HIGH (ref 65–99)
Potassium: 3.1 mmol/L — ABNORMAL LOW (ref 3.5–5.1)
Sodium: 139 mmol/L (ref 135–145)
Total Bilirubin: 0.9 mg/dL (ref 0.3–1.2)
Total Protein: 7.1 g/dL (ref 6.5–8.1)

## 2016-09-02 LAB — BRAIN NATRIURETIC PEPTIDE: B Natriuretic Peptide: 34 pg/mL (ref 0.0–100.0)

## 2016-09-02 LAB — CBC
HCT: 47.9 % (ref 40.0–52.0)
Hemoglobin: 15.2 g/dL (ref 13.0–18.0)
MCH: 26 pg (ref 26.0–34.0)
MCHC: 31.7 g/dL — ABNORMAL LOW (ref 32.0–36.0)
MCV: 82 fL (ref 80.0–100.0)
Platelets: 210 10*3/uL (ref 150–440)
RBC: 5.84 MIL/uL (ref 4.40–5.90)
RDW: 20.9 % — ABNORMAL HIGH (ref 11.5–14.5)
WBC: 6.9 10*3/uL (ref 3.8–10.6)

## 2016-09-02 LAB — TROPONIN I: Troponin I: <0.03 ng/mL (ref <0.03)

## 2016-09-02 MED ORDER — ONDANSETRON HCL 4 MG/2ML IJ SOLN
4.0000 mg | Freq: Once | INTRAMUSCULAR | Status: AC
  Administered 2016-09-02: 4 mg via INTRAVENOUS
  Filled 2016-09-02: qty 2

## 2016-09-02 MED ORDER — MORPHINE SULFATE (PF) 2 MG/ML IV SOLN
4.0000 mg | Freq: Once | INTRAVENOUS | Status: AC
  Administered 2016-09-02: 4 mg via INTRAVENOUS
  Filled 2016-09-02: qty 2

## 2016-09-02 NOTE — ED Provider Notes (Signed)
[facility] Emergency Department Provider Note    First MD Initiated Contact with Patient 09/02/16 2315     (approximate)  I have reviewed the triage vital signs and the nursing notes.   HISTORY  Chief Complaint Chest Pain    HPI Lawrence [name] is a 60 y.o. male with history of below listed chronic medical conditions presents to the emergency department with a one-day history of constant right-sided chest pain with radiation to the left chest. Patient admits to dyspnea. Patient denies any nausea no vomiting or diaphoresis. Patient states that his pain today is worse than previous episodes of chest pain consistent with when he had to have a stent placed. Patient denies any lower extremity pain or swelling   Past Medical History:  Diagnosis Date  . A-fib ([place])   . Anemia   . Anxiety   . Asthma   . CAD (coronary artery disease)    a. 2002 CABGx2 (LIMA->LAD, VG->VG->OM1);  b. 09/2012 DES->OM;  c. 03/2015 PTCA of LAD [facility]) in setting of atretic LIMA; d. 05/2015 Cath [facility]): nonobs dzs; e. 06/2015 Cath ([facility]): LM nl, LAD 45p/d ISR, 50d, D1/2 small, LCX 50p/d ISR, OM1 70ost, 30 ISR, VG->OM1 50ost, 27m LIMA->LAD 99p/d - atretic, RCA dom, nl; f.cath 10/16: 40-50%(FFR 0.90) pLAD, 75% (FFR 0.77) mLAD s/p PCI/DES, oRCA 40% (FFR0.95)  . Celiac disease   . Chronic diastolic CHF (congestive heart failure) ([place]    a. 06/2009 Echo: EF 60-65%, Gr 1 DD, triv AI, mildly dil LA, nl RV.  .Marland KitchenCOPD (chronic obstructive pulmonary disease) ([place]    a. Chronic bronchitis and emphysema.  .Marland KitchenDysrhythmia   . Essential hypertension   . History of tobacco abuse    a. Quit 2014.  .Marland KitchenPSVT (paroxysmal supraventricular tachycardia) ([place]    a. 10/2012 Noted on Zio Patch.  . Type II diabetes mellitus ([facility]     Patient Active Problem List   Diagnosis Date Noted  . Dyspnea 04/03/2016  . Hypotension 04/03/2016  . Chronic kidney disease 04/03/2016  . Anemia 04/03/2016  . Paroxysmal atrial  fibrillation ([place] 12/23/2015  . OSA (obstructive sleep apnea) 12/10/2015  . Left inguinal hernia 11/07/2015  . Anxiety 11/07/2015  . Unstable angina ([place] 10/05/2015  . PAF (paroxysmal atrial fibrillation) ([place] 10/05/2015  . Back pain with left-sided radiculopathy 09/30/2015  . Nocturnal hypoxia 09/06/2015  . BPH (benign prostatic hyperplasia) 08/01/2015  . Chronic diastolic CHF (congestive heart failure) ([place]   . Angina pectoris ([place]   . Chest pain 07/11/2015  . COPD (chronic obstructive pulmonary disease) ([place] 07/03/2015  . CAD (coronary artery disease) 06/26/2015  . HTN (hypertension) 06/26/2015  . DM (diabetes mellitus) ([place] 06/26/2015  . Achalasia 07/24/2014  . GERD (gastroesophageal reflux disease) 06/07/2014  . Former tobacco use 04/11/2013  . HLD (hyperlipidemia) 04/09/2013    Past Surgical History:  Procedure Laterality Date  . BYPASS GRAFT    . CARDIAC CATHETERIZATION N/A 07/12/2015   rocedure: Left Heart Cath and Cors/Grafts Angiography;  Surgeon: HBelva Crome MD;  [place] [place] [place];  Service: Cardiovascular;  Laterality: N/A;  . CARDIAC CATHETERIZATION Right 10/07/2015   Procedure: Left Heart Cath and Cors/Grafts Angiography;  Surgeon: SDionisio David MD;  [place] [facility];  Service: Cardiovascular;  Laterality: Right;  . CARDIAC CATHETERIZATION N/A 04/06/2016   Procedure: Left Heart Cath and Coronary Angiography;  Surgeon: DYolonda Kida MD;  [place];  Service: Cardiovascular;  Laterality:  N/A;  . CARDIAC CATHETERIZATION  04/06/2016   Procedure: Bypass Graft Angiography;  Surgeon: Yolonda Kida, MD;  Location: [place] [facility];  Service: Cardiovascular;;  . CHOLECYSTECTOMY    . ESOPHAGEAL DILATION    . TONSILLECTOMY    . VASCULAR SURGERY      Prior to Admission medications   Medication Sig Start Date End Date Taking? Authorizing Provider  albuterol (PROVENTIL HFA;VENTOLIN HFA) 108 (90 Base) MCG/ACT  inhaler Inhale 4-6 puffs into the lungs every 4 (four) hours as needed for wheezing or shortness of breath. Reported on 04/08/2016    Historical Provider, MD  allopurinol (ZYLOPRIM) 100 MG tablet THREE (3) TABLETS TWICE DAILY 04/25/16   Birdie Sons, MD  ALPRAZolam Duanne Moron) 1 MG tablet Take 0.5-1 tablets (0.5-1 mg total) by mouth 3 (three) times daily as needed for anxiety. 05/26/16   Birdie Sons, MD  amLODipine (NORVASC) 10 MG tablet Take 10 mg by mouth daily.    Historical Provider, MD  aspirin 81 MG EC tablet Take 81 mg by mouth daily. Swallow whole.    Historical Provider, MD  atorvastatin (LIPITOR) 80 MG tablet TAKE 1 TABLET BY MOUTH EVERY NIGHT AT BEDTIME. 05/26/16   Birdie Sons, MD  cetirizine (ZYRTEC) 10 MG tablet TAKE ONE TABLET AT BEDTIME 04/19/16   Birdie Sons, MD  cyclobenzaprine (FLEXERIL) 10 MG tablet TAKE 1 TABLET BY MOUTH EVERY 8 HOURS AS NEEEDED FOR MUSCLE SPASM 07/25/16   Birdie Sons, MD  dicyclomine (BENTYL) 20 MG tablet Take 1 tablet (20 mg total) by mouth 3 (three) times daily as needed (abdominal pain). 04/24/16   Nance Pear, MD  ELIQUIS 5 MG TABS tablet TAKE ONE TABLET TWICE DAILY 07/25/16   Birdie Sons, MD  ferrous sulfate 325 (65 FE) MG tablet Take 325 mg by mouth daily with breakfast. 08/03/16 08/03/17  Historical Provider, MD  fluticasone furoate-vilanterol (BREO ELLIPTA) 100-25 MCG/INH AEPB Inhale 1 puff into the lungs daily.     Historical Provider, MD  furosemide (LASIX) 80 MG tablet Take 40 mg by mouth daily.     Historical Provider, MD  glucose blood (ACCU-CHEK AVIVA PLUS) test strip Use to check blood sugar once a day 08/21/16   Birdie Sons, MD  hyoscyamine (LEVSIN SL) 0.125 MG SL tablet Place under the tongue. Place 1 tablet (0.125 mg total) under the tongue every 4 (four) hours as needed for Cramping. 08/18/16 10/17/16  Historical Provider, MD  isosorbide mononitrate (IMDUR) 60 MG 24 hr tablet Take 120 mg by mouth daily. Reported on 04/24/2016     Historical Provider, MD  lansoprazole (PREVACID) 30 MG capsule Take 30 mg by mouth 2 (two) times daily.     Historical Provider, MD  lisinopril (PRINIVIL,ZESTRIL) 5 MG tablet Take 5 mg by mouth daily.    Historical Provider, MD  magnesium oxide (MAG-OX) 400 (241.3 Mg) MG tablet Take 400 mg by mouth daily.    Historical Provider, MD  metFORMIN (GLUCOPHAGE) 500 MG tablet Take 500 mg by mouth daily with breakfast.     Historical Provider, MD  metoprolol tartrate (LOPRESSOR) 25 MG tablet Take 0.5 tablets (12.5 mg total) by mouth 2 (two) times daily. 06/26/16   Epifanio Lesches, MD  nitroGLYCERIN (NITROSTAT) 0.4 MG SL tablet Place 0.4 mg under the tongue every 5 (five) minutes as needed for chest pain. Reported on 05/26/2016    Historical Provider, MD  omega-3 acid ethyl esters (LOVAZA) 1 g capsule TAKE FOUR CAPSULES BY  MOUTH DAILY 07/25/16   Birdie Sons, MD  oxyCODONE-acetaminophen (PERCOCET) 10-325 MG tablet Take 1 tablet by mouth every 4 (four) hours as needed. 08/15/16   Birdie Sons, MD  RANEXA 500 MG 12 hr tablet TAKE TWO (2) TABLETS TWICE A DAY 07/25/16   Birdie Sons, MD  senna (SENOKOT) 8.6 MG tablet Take 1 tablet by mouth at bedtime. 08/03/16 09/02/16  Historical Provider, MD  sotalol (BETAPACE) 120 MG tablet Take 120 mg by mouth daily.    Historical Provider, MD  sucralfate (CARAFATE) 1 g tablet Take 1 tablet (1 g total) by mouth 2 (two) times daily. 04/22/16   [name], MD  tamsulosin (FLOMAX) 0.4 MG CAPS capsule Take 0.4 mg by mouth daily after breakfast.     Historical Provider, MD  tiotropium (SPIRIVA) 18 MCG inhalation capsule Place 18 mcg into inhaler and inhale daily.    Historical Provider, MD    Allergies Demerol [meperidine]; Prednisone; Sulfa antibiotics; Albuterol sulfate [albuterol]; and Morphine sulfate  Family History  Problem Relation Age of Onset  . Heart attack Mother   . Depression Mother   . Heart disease Mother   . COPD Mother   . Hypertension Mother     . Heart attack Father   . Diabetes Father   . Depression Father   . Heart disease Father   . Cirrhosis Father   . Parkinson's disease Brother     Social History Social History  Substance Use Topics  . Smoking status: Former Smoker    Quit date: 04/22/2013  . Smokeless tobacco: Never Used  . Alcohol use No     Comment: remotely quit alcohol use. Hx of heavy alcohol use.    Review of Systems Constitutional: No fever/chills Eyes: No visual changes. ENT: No sore throat. Cardiovascular: Positive for chest pain. Respiratory: Denies shortness of breath. Gastrointestinal: No abdominal pain.  No nausea, no vomiting.  No diarrhea.  No constipation. Genitourinary: Negative for dysuria. Musculoskeletal: Negative for back pain. Skin: Negative for rash. Neurological: Negative for headaches, focal weakness or numbness.  10-point ROS otherwise negative.  ____________________________________________   PHYSICAL EXAM:  VITAL SIGNS: ED Triage Vitals  Enc Vitals Group     BP 09/02/16 2012 (!) 142/79     Pulse Rate 09/02/16 2012 71     Resp 09/02/16 2012 20     Temp 09/02/16 2012 97.6 F (36.4 C)     Temp Source 09/02/16 2012 Oral     SpO2 09/02/16 2012 94 %     Weight 09/02/16 2015 244 lb (110.7 kg)     Height 09/02/16 2015 5' 7"  (1.702 m)     Head Circumference --      Peak Flow --      Pain Score 09/02/16 2015 9     Pain Loc --      Pain Edu? --      Excl. in [employer]? --     Constitutional: Alert and oriented. Well appearing and in no acute distress. Eyes: Conjunctivae are normal. PERRL. EOMI. Head: Atraumatic. Mouth/Throat: Mucous membranes are moist.  Oropharynx non-erythematous. Neck: No stridor.  No meningeal signs. Cardiovascular: Normal rate, regular rhythm. Good peripheral circulation. Grossly normal heart sounds. Respiratory: Normal respiratory effort.  No retractions. Lungs CTAB. Gastrointestinal: Soft and nontender. No distention.  Musculoskeletal: No lower  extremity tenderness nor edema. No gross deformities of extremities. Neurologic:  Normal speech and language. No gross focal neurologic deficits are appreciated.  Skin:  Skin is  warm, dry and intact. No rash noted. Psychiatric: Mood and affect are normal. Speech and behavior are normal.  ____________________________________________   LABS (all labs ordered are listed, but only abnormal results are displayed)  Labs Reviewed  CBC - Abnormal; Notable for the following:       Result Value   MCHC 31.7 (*)    RDW 20.9 (*)    All other components within normal limits  COMPREHENSIVE METABOLIC PANEL - Abnormal; Notable for the following:    Potassium 3.1 (*)    Chloride 96 (*)    CO2 36 (*)    Glucose, Bld 153 (*)    Creatinine, Ser 1.31 (*)    GFR calc non Af Amer 57 (*)    All other components within normal limits  BRAIN NATRIURETIC PEPTIDE  TROPONIN I   ____________________________________________  EKG  ED ECG REPORT I, Enola N Zenovia Justman, the attending physician, personally viewed and interpreted this ECG.   Date: 09/03/2016  EKG Time: 8:19 PM  Rate: 73  Rhythm: Normal sinus rhythm  Axis: Normal  Intervals: Normal  ST&T Change: None  ____________________________________________  RADIOLOGY I, [place] Mega Kinkade, personally viewed and evaluated these images (plain radiographs) as part of my medical decision making, as well as reviewing the written report by the radiologist.  Dg Chest 2 View  Result Date: 09/02/2016 CLINICAL DATA:  Right-sided chest pain EXAM: CHEST  2 VIEW COMPARISON:  August 25, 2016 FINDINGS: There is no edema or consolidation. The heart size and pulmonary vascularity are normal. No adenopathy. There is atherosclerotic calcification in the aorta. Patient is status post internal mammary bypass grafting. There is degenerative change in the thoracic spine. There is calcification in the left carotid artery. IMPRESSION: No edema or consolidation. Aortic  atherosclerosis noted. There is also calcification in the left carotid artery. Electronically Signed   By: Lowella Grip III M.D.   On: 09/02/2016 20:56     Procedures      INITIAL IMPRESSION / ASSESSMENT AND PLAN / ED COURSE  Pertinent labs & imaging results that were available during my care of the patient were reviewed by me and considered in my medical decision making (see chart for details).  Patient given IV morphine for pain with some improvement however chest pain continues. Patient discussed with Dr. Jannifer [place] for hospital admission for further evaluation and management given concern for cardiac etiology of the patient's chest pain   Clinical Course    ____________________________________________  FINAL CLINICAL IMPRESSION(S) / ED DIAGNOSES  Final diagnoses:  Chest pain, unspecified type     MEDICATIONS GIVEN DURING THIS VISIT:  Medications  morphine 2 MG/ML injection 4 mg (4 mg Intravenous Given 09/02/16 2353)  ondansetron (ZOFRAN) injection 4 mg (4 mg Intravenous Given 09/02/16 2353)     NEW OUTPATIENT MEDICATIONS STARTED DURING THIS VISIT:  New Prescriptions   No medications on file    Modified Medications   No medications on file    Discontinued Medications   No medications on file     Note:  This document was prepared using Dragon voice recognition software and may include unintentional dictation errors.    Gregor Hams, MD 09/03/16 [phone]

## 2016-09-02 NOTE — ED Triage Notes (Signed)
Pt arrived to the ED for complaints of right side chest pain starting today. Pt reports that he has an extensive cardiac history and has afibrilation. Pt is AOx4 in no apparent distress.

## 2016-09-03 ENCOUNTER — Ambulatory Visit: Payer: Self-pay | Admitting: *Deleted

## 2016-09-03 DIAGNOSIS — I200 Unstable angina: Secondary | ICD-10-CM | POA: Diagnosis not present

## 2016-09-03 DIAGNOSIS — I2510 Atherosclerotic heart disease of native coronary artery without angina pectoris: Secondary | ICD-10-CM | POA: Diagnosis not present

## 2016-09-03 DIAGNOSIS — I25798 Atherosclerosis of other coronary artery bypass graft(s) with other forms of angina pectoris: Secondary | ICD-10-CM | POA: Diagnosis not present

## 2016-09-03 DIAGNOSIS — E119 Type 2 diabetes mellitus without complications: Secondary | ICD-10-CM | POA: Diagnosis not present

## 2016-09-03 DIAGNOSIS — I5032 Chronic diastolic (congestive) heart failure: Secondary | ICD-10-CM | POA: Diagnosis not present

## 2016-09-03 DIAGNOSIS — I25110 Atherosclerotic heart disease of native coronary artery with unstable angina pectoris: Secondary | ICD-10-CM | POA: Diagnosis not present

## 2016-09-03 DIAGNOSIS — I10 Essential (primary) hypertension: Secondary | ICD-10-CM | POA: Diagnosis not present

## 2016-09-03 LAB — CBC
HCT: 45.8 % (ref 40.0–52.0)
Hemoglobin: 14.6 g/dL (ref 13.0–18.0)
MCH: 26.3 pg (ref 26.0–34.0)
MCHC: 31.8 g/dL — ABNORMAL LOW (ref 32.0–36.0)
MCV: 82.8 fL (ref 80.0–100.0)
Platelets: 203 10*3/uL (ref 150–440)
RBC: 5.53 MIL/uL (ref 4.40–5.90)
RDW: 20.7 % — ABNORMAL HIGH (ref 11.5–14.5)
WBC: 5.7 10*3/uL (ref 3.8–10.6)

## 2016-09-03 LAB — BASIC METABOLIC PANEL
Anion gap: 6 (ref 5–15)
BUN: 15 mg/dL (ref 6–20)
CO2: 37 mmol/L — ABNORMAL HIGH (ref 22–32)
Calcium: 9.4 mg/dL (ref 8.9–10.3)
Chloride: 98 mmol/L — ABNORMAL LOW (ref 101–111)
Creatinine, Ser: 1.2 mg/dL (ref 0.61–1.24)
GFR calc Af Amer: >60 mL/min (ref >60)
GFR calc non Af Amer: >60 mL/min (ref >60)
Glucose, Bld: 119 mg/dL — ABNORMAL HIGH (ref 65–99)
Potassium: 3.7 mmol/L (ref 3.5–5.1)
Sodium: 141 mmol/L (ref 135–145)

## 2016-09-03 LAB — TROPONIN I
Troponin I: <0.03 ng/mL (ref <0.03)
Troponin I: <0.03 ng/mL (ref <0.03)

## 2016-09-03 LAB — GLUCOSE, CAPILLARY
Glucose-Capillary: 176 mg/dL — ABNORMAL HIGH (ref 65–99)
Glucose-Capillary: 137 mg/dL — ABNORMAL HIGH (ref 65–99)

## 2016-09-03 MED ORDER — ALPRAZOLAM 0.5 MG PO TABS: 0.5000 mg | ORAL_TABLET | Freq: Three times a day (TID) | ORAL | Status: DC | PRN

## 2016-09-03 MED ORDER — ONDANSETRON HCL 4 MG/2ML IJ SOLN: 4.0000 mg | Freq: Four times a day (QID) | INTRAMUSCULAR | Status: DC | PRN

## 2016-09-03 MED ORDER — TAMSULOSIN HCL 0.4 MG PO CAPS
0.4000 mg | ORAL_CAPSULE | Freq: Every day | ORAL | Status: DC
  Administered 2016-09-03: 0.4 mg via ORAL
  Filled 2016-09-03: qty 1

## 2016-09-03 MED ORDER — METOPROLOL TARTRATE 25 MG PO TABS
12.5000 mg | ORAL_TABLET | Freq: Two times a day (BID) | ORAL | Status: DC
  Administered 2016-09-03: 12.5 mg via ORAL
  Filled 2016-09-03: qty 1

## 2016-09-03 MED ORDER — PANTOPRAZOLE SODIUM 40 MG PO TBEC
40.0000 mg | DELAYED_RELEASE_TABLET | Freq: Every day | ORAL | Status: DC
  Administered 2016-09-03: 40 mg via ORAL
  Filled 2016-09-03: qty 1

## 2016-09-03 MED ORDER — TIOTROPIUM BROMIDE MONOHYDRATE 18 MCG IN CAPS
18.0000 ug | ORAL_CAPSULE | Freq: Every day | RESPIRATORY_TRACT | Status: DC
  Administered 2016-09-03: 18 ug via RESPIRATORY_TRACT
  Filled 2016-09-03: qty 5

## 2016-09-03 MED ORDER — OXYCODONE HCL 5 MG PO TABS
5.0000 mg | ORAL_TABLET | ORAL | Status: DC | PRN
  Administered 2016-09-03 (×2): 5 mg via ORAL
  Filled 2016-09-03 (×2): qty 1

## 2016-09-03 MED ORDER — RANOLAZINE ER 500 MG PO TB12
1000.0000 mg | ORAL_TABLET | Freq: Two times a day (BID) | ORAL | Status: DC
  Administered 2016-09-03: 1000 mg via ORAL
  Filled 2016-09-03: qty 2

## 2016-09-03 MED ORDER — ALLOPURINOL 100 MG PO TABS
100.0000 mg | ORAL_TABLET | Freq: Two times a day (BID) | ORAL | Status: DC
  Administered 2016-09-03: 100 mg via ORAL
  Filled 2016-09-03: qty 1

## 2016-09-03 MED ORDER — INSULIN ASPART 100 UNIT/ML ~~LOC~~ SOLN
0.0000 [IU] | Freq: Three times a day (TID) | SUBCUTANEOUS | Status: DC
  Administered 2016-09-03: 2 [IU] via SUBCUTANEOUS
  Administered 2016-09-03: 1 [IU] via SUBCUTANEOUS
  Filled 2016-09-03: qty 1
  Filled 2016-09-03: qty 2

## 2016-09-03 MED ORDER — OXYCODONE-ACETAMINOPHEN 5-325 MG PO TABS
1.0000 | ORAL_TABLET | ORAL | Status: DC | PRN
  Administered 2016-09-03 (×2): 1 via ORAL
  Filled 2016-09-03 (×2): qty 1

## 2016-09-03 MED ORDER — ORAL CARE MOUTH RINSE
15.0000 mL | Freq: Two times a day (BID) | OROMUCOSAL | Status: DC
  Administered 2016-09-03: 15 mL via OROMUCOSAL

## 2016-09-03 MED ORDER — NITROGLYCERIN 0.4 MG SL SUBL
0.4000 mg | SUBLINGUAL_TABLET | SUBLINGUAL | Status: DC | PRN
  Administered 2016-09-03 (×3): 0.4 mg via SUBLINGUAL
  Filled 2016-09-03 (×2): qty 1

## 2016-09-03 MED ORDER — ISOSORBIDE MONONITRATE ER 60 MG PO TB24
120.0000 mg | ORAL_TABLET | Freq: Every day | ORAL | Status: DC
  Administered 2016-09-03: 120 mg via ORAL
  Filled 2016-09-03: qty 2

## 2016-09-03 MED ORDER — LISINOPRIL 5 MG PO TABS
5.0000 mg | ORAL_TABLET | Freq: Every day | ORAL | Status: DC
  Administered 2016-09-03: 5 mg via ORAL
  Filled 2016-09-03: qty 1

## 2016-09-03 MED ORDER — ACETAMINOPHEN 325 MG PO TABS: 650.0000 mg | ORAL_TABLET | Freq: Four times a day (QID) | ORAL | Status: DC | PRN

## 2016-09-03 MED ORDER — SENNOSIDES-DOCUSATE SODIUM 8.6-50 MG PO TABS: 2.0000 | ORAL_TABLET | Freq: Two times a day (BID) | ORAL | Status: DC

## 2016-09-03 MED ORDER — INSULIN ASPART 100 UNIT/ML ~~LOC~~ SOLN: 0.0000 [IU] | Freq: Every day | SUBCUTANEOUS | Status: DC

## 2016-09-03 MED ORDER — FUROSEMIDE 40 MG PO TABS
40.0000 mg | ORAL_TABLET | Freq: Every day | ORAL | Status: DC
  Administered 2016-09-03: 40 mg via ORAL
  Filled 2016-09-03: qty 1

## 2016-09-03 MED ORDER — OXYCODONE-ACETAMINOPHEN 5-325 MG PO TABS
1.0000 | ORAL_TABLET | ORAL | Status: DC | PRN
  Administered 2016-09-03: 1 via ORAL
  Filled 2016-09-03: qty 1

## 2016-09-03 MED ORDER — APIXABAN 5 MG PO TABS
5.0000 mg | ORAL_TABLET | Freq: Two times a day (BID) | ORAL | Status: DC
  Administered 2016-09-03: 5 mg via ORAL
  Filled 2016-09-03: qty 1

## 2016-09-03 MED ORDER — ATORVASTATIN CALCIUM 20 MG PO TABS: 80.0000 mg | ORAL_TABLET | Freq: Every day | ORAL | Status: DC

## 2016-09-03 MED ORDER — ACETAMINOPHEN 650 MG RE SUPP: 650.0000 mg | Freq: Four times a day (QID) | RECTAL | Status: DC | PRN

## 2016-09-03 MED ORDER — SOTALOL HCL 120 MG PO TABS
120.0000 mg | ORAL_TABLET | Freq: Every day | ORAL | Status: DC
  Administered 2016-09-03: 120 mg via ORAL
  Filled 2016-09-03: qty 1

## 2016-09-03 MED ORDER — OXYCODONE-ACETAMINOPHEN 10-325 MG PO TABS: 1.0000 | ORAL_TABLET | ORAL | Status: DC | PRN

## 2016-09-03 MED ORDER — ASPIRIN EC 81 MG PO TBEC
81.0000 mg | DELAYED_RELEASE_TABLET | Freq: Every day | ORAL | Status: DC
  Administered 2016-09-03: 81 mg via ORAL
  Filled 2016-09-03: qty 1

## 2016-09-03 MED ORDER — OXYCODONE HCL 5 MG PO TABS
5.0000 mg | ORAL_TABLET | ORAL | Status: DC | PRN
  Administered 2016-09-03: 5 mg via ORAL
  Filled 2016-09-03: qty 1

## 2016-09-03 MED ORDER — AMLODIPINE BESYLATE 5 MG PO TABS
10.0000 mg | ORAL_TABLET | Freq: Every day | ORAL | Status: DC
  Administered 2016-09-03: 10 mg via ORAL
  Filled 2016-09-03: qty 2

## 2016-09-03 MED ORDER — ONDANSETRON HCL 4 MG PO TABS: 4.0000 mg | ORAL_TABLET | Freq: Four times a day (QID) | ORAL | Status: DC | PRN

## 2016-09-03 MED ORDER — CHLORHEXIDINE GLUCONATE 0.12 % MT SOLN
15.0000 mL | Freq: Two times a day (BID) | OROMUCOSAL | Status: DC
  Administered 2016-09-03: 15 mL via OROMUCOSAL
  Filled 2016-09-03: qty 15

## 2016-09-03 NOTE — Consult Note (Signed)
[facility] Consultation Note  Patient ID: Lawrence [name], MRN: [id number], DOB/AGE: [birth year] 62 y.o. Admit date: 09/02/2016   Date of Consult: 09/03/2016 Primary Physician: [name], MD Primary Cardiologist: [name]  Chief Complaint:  Chief Complaint  Patient presents with  . Chest Pain   Reason for Consult: chest pain with known cardiovascular disease  HPI: 60 y.o. male with known severe chronic obstructive pulmonary disease requiring oxygen and inhalers as well as coronary artery bypass graft status post previous myocardial infarction excisional nonvalvular atrial fibrillation remaining in normal rhythm diabetes with complication essential hypertension having new onset of right sided chest discomfort with shortness of breath increasing in frequency and intensity over a one day period with no apparent significant weight gain or other changes. The patient did have an EKG showing no current evidence of myocardial infarction with a normal troponin. The patient has had improvements of symptoms with continued oxygenation and some Lasix as well as his maximize medication management for his current conditions. He remains on anticoagulation and sotalol as well as metoprolol for his previous atrial fibrillation remaining in normal rhythm at this time. Additionally he is on maximize antianginal medication management which works fairly well. He is maximized on his atorvastatin for risk reduction cardiovascular in intervention and issues in the future. He remains on minimal antiplatelet medication management without bleeding complications. He now is feeling better at this time and is likely had some hypoxia and other atypical chest discomfort causing his in juice at this time  Past Medical History:  Diagnosis Date  . A-fib ([place])   . Anemia   . Anxiety   . Asthma   . CAD (coronary artery disease)    a. 2002 CABGx2 (LIMA->LAD, VG->VG->OM1);  b. 09/2012 DES->OM;  c. 03/2015 PTCA of  LAD [facility]) in setting of atretic LIMA; d. 05/2015 Cath [facility]): nonobs dzs; e. 06/2015 Cath ([facility]): LM nl, LAD 45p/d ISR, 50d, D1/2 small, LCX 50p/d ISR, OM1 70ost, 30 ISR, VG->OM1 50ost, 36m LIMA->LAD 99p/d - atretic, RCA dom, nl; f.cath 10/16: 40-50%(FFR 0.90) pLAD, 75% (FFR 0.77) mLAD s/p PCI/DES, oRCA 40% (FFR0.95)  . Celiac disease   . Chronic diastolic CH[place]congestive [place] failure[place]    a. 06/2009 Echo: EF 60-65%, Gr 1 DD, triv AI, mildly dil LA, nl RV.  .Marland KitchenCOPD (chronic obstructive pulmonary disease) ([place]    a. Chronic bronchitis and emphysema.  .Marland KitchenDysrhythmia   . Essential hypertension   . History of tobacco abuse    a. Quit 2014.  .Marland KitchenPSVT (paroxysmal supraventricular tachycardia) ([place]    a. 10/2012 Noted on Zio Patch.  . Type II diabetes mellitus ([place]       Surgical History:  Past Surgical History:  Procedure Laterality Date  . BYPASS GRAFT    . CARDIAC CATHETERIZATION N/A 07/12/2015   rocedure: Left Heart Cath and Cors/Grafts Angiography;  Surgeon: HBelva Crome MD;  [facility];  Service: Cardiovascular;  Laterality: N/A;  . CARDIAC CATHETERIZATION Right 10/07/2015   Procedure: Left Heart Cath and Cors/Grafts Angiography;  Surgeon: SDionisio David MD;  [facility];  Service: Cardiovascular;  Laterality: Right;  . CARDIAC CATHETERIZATION N/A 04/06/2016   Procedure: Left Heart Cath and Coronary Angiography;  Surgeon: DYolonda Kida MD;  [facility] [facility];  Service: Cardiovascular;  Laterality: N/A;  . CARDIAC CATHETERIZATION  04/06/2016   Procedure: Bypass Graft Angiography;  Surgeon: DYolonda Kida MD;  [facility];  Service: Cardiovascular;;  . CHOLECYSTECTOMY    . ESOPHAGEAL DILATION    . TONSILLECTOMY    . VASCULAR SURGERY       Home Meds: Prior to Admission medications   Medication Sig Start Date End Date Taking? Authorizing Provider  albuterol (PROVENTIL HFA;VENTOLIN HFA) 108 (90 Base) MCG/ACT  inhaler Inhale 4-6 puffs into the lungs every 4 (four) hours as needed for wheezing or shortness of breath. Reported on 04/08/2016   Yes Historical Provider, MD  allopurinol (ZYLOPRIM) 100 MG tablet THREE (3) TABLETS TWICE DAILY 04/25/16  Yes Birdie Sons, MD  ALPRAZolam Duanne Moron) 1 MG tablet Take 0.5-1 tablets (0.5-1 mg total) by mouth 3 (three) times daily as needed for anxiety. 05/26/16  Yes Birdie Sons, MD  amLODipine (NORVASC) 10 MG tablet Take 10 mg by mouth daily.   Yes Historical Provider, MD  aspirin 81 MG EC tablet Take 81 mg by mouth daily. Swallow whole.   Yes Historical Provider, MD  atorvastatin (LIPITOR) 80 MG tablet TAKE 1 TABLET BY MOUTH EVERY NIGHT AT BEDTIME. 05/26/16  Yes Birdie Sons, MD  budesonide-formoterol [facility]) 160-4.5 MCG/ACT inhaler Inhale 2 puffs into the lungs 2 (two) times daily.   Yes Historical Provider, MD  cetirizine (ZYRTEC) 10 MG tablet TAKE ONE TABLET AT BEDTIME 04/19/16  Yes Birdie Sons, MD  cyclobenzaprine (FLEXERIL) 10 MG tablet TAKE 1 TABLET BY MOUTH EVERY 8 HOURS AS NEEEDED FOR MUSCLE SPASM 07/25/16  Yes Birdie Sons, MD  ELIQUIS 5 MG TABS tablet TAKE ONE TABLET TWICE DAILY 07/25/16  Yes Birdie Sons, MD  ferrous sulfate 325 (65 FE) MG tablet Take 325 mg by mouth daily with breakfast. 08/03/16 08/03/17 Yes Historical Provider, MD  fluticasone furoate-vilanterol (BREO ELLIPTA) 100-25 MCG/INH AEPB Inhale 1 puff into the lungs daily.    Yes Historical Provider, MD  glucose blood (ACCU-CHEK AVIVA PLUS) test strip Use to check blood sugar once a day 08/21/16  Yes Birdie Sons, MD  lansoprazole (PREVACID) 30 MG capsule Take 30 mg by mouth 2 (two) times daily.    Yes Historical Provider, MD  magnesium oxide (MAG-OX) 400 (241.3 Mg) MG tablet Take 400 mg by mouth daily.   Yes Historical Provider, MD  metoprolol tartrate (LOPRESSOR) 25 MG tablet Take 0.5 tablets (12.5 mg total) by mouth 2 (two) times daily. 06/26/16  Yes Epifanio Lesches, MD   nitroGLYCERIN (NITROSTAT) 0.4 MG SL tablet Place 0.4 mg under the tongue every 5 (five) minutes as needed for chest pain. Reported on 05/26/2016   Yes Historical Provider, MD  omega-3 acid ethyl esters (LOVAZA) 1 g capsule TAKE FOUR CAPSULES BY MOUTH DAILY 07/25/16  Yes Birdie Sons, MD  oxyCODONE-acetaminophen (PERCOCET) 10-325 MG tablet Take 1 tablet by mouth every 4 (four) hours as needed. 08/15/16  Yes Birdie Sons, MD  RANEXA 500 MG 12 hr tablet TAKE TWO (2) TABLETS TWICE A DAY 07/25/16  Yes Birdie Sons, MD  senna (SENOKOT) 8.6 MG TABS tablet Take 2 tablets by mouth daily.   Yes Historical Provider, MD  sotalol (BETAPACE) 120 MG tablet Take 120 mg by mouth daily.   Yes Historical Provider, MD  sucralfate (CARAFATE) 1 g tablet Take 1 tablet (1 g total) by mouth 2 (two) times daily. 04/22/16  Yes [name], MD  tamsulosin (FLOMAX) 0.4 MG CAPS capsule Take 0.4 mg by mouth daily after breakfast.    Yes Historical Provider, MD  tiotropium (SPIRIVA) 18 MCG inhalation  capsule Place 18 mcg into inhaler and inhale daily.   Yes Historical Provider, MD  torsemide (DEMADEX) 100 MG tablet Take 100 mg by mouth daily.   Yes Historical Provider, MD    Inpatient Medications:  . allopurinol  100 mg Oral BID  . amLODipine  10 mg Oral Daily  . apixaban  5 mg Oral BID  . aspirin EC  81 mg Oral Daily  . atorvastatin  80 mg Oral QHS  . chlorhexidine  15 mL Mouth Rinse BID  . furosemide  40 mg Oral Daily  . insulin aspart  0-5 Units Subcutaneous QHS  . insulin aspart  0-9 Units Subcutaneous TID WC  . isosorbide mononitrate  120 mg Oral Daily  . lisinopril  5 mg Oral Daily  . mouth rinse  15 mL Mouth Rinse q12n4p  . metoprolol tartrate  12.5 mg Oral BID  . pantoprazole  40 mg Oral Daily  . ranolazine  1,000 mg Oral BID  . senna-docusate  2 tablet Oral BID  . sotalol  120 mg Oral Daily  . tamsulosin  0.4 mg Oral QPC breakfast  . tiotropium  18 mcg Inhalation Daily      Allergies:   Allergies  Allergen Reactions  . Demerol [Meperidine] Hives  . Prednisone Other (See Comments) and Hypertension    Pt states that this medication puts him in A-fib   . Sulfa Antibiotics Hives  . Albuterol Sulfate [Albuterol] Palpitations and Other (See Comments)    Pt currently uses this medication.    . Morphine Sulfate Nausea And Vomiting, Rash and Other (See Comments)    Pt states that he is only allergic to the tablet form of this medication.      Social History   Social History  . Marital status: Married    Spouse name: N/A  . Number of children: N/A  . Years of education: N/A   Occupational History  . Disabled    Social History Main Topics  . Smoking status: Former Smoker    Quit date: 04/22/2013  . Smokeless tobacco: Never Used  . Alcohol use No     Comment: remotely quit alcohol use. Hx of heavy alcohol use.  . Drug use: No  . Sexual activity: Not on file   Other Topics Concern  . Not on file   Social History Narrative   Pt lives in [place] with wife.  Does not routinely exercise.     Family History  Problem Relation Age of Onset  . Heart attack Mother   . Depression Mother   . Heart disease Mother   . COPD Mother   . Hypertension Mother   . Heart attack Father   . Diabetes Father   . Depression Father   . Heart disease Father   . Cirrhosis Father   . Parkinson's disease Brother      Review of Systems Positive for Chest pain shortness of breath Negative for: General:  chills, fever, night sweats or weight changes.  Cardiovascular: PND orthopnea syncope dizziness  Dermatological skin lesions rashes Respiratory: Cough congestion Urologic: Frequent urination urination at night and hematuria Abdominal: negative for nausea, vomiting, diarrhea, bright red blood per rectum, melena, or hematemesis Neurologic: negative for visual changes, and/or hearing changes  All other systems reviewed and are otherwise negative except as noted  above.  Labs:  Recent Labs  09/02/16 2021 09/03/16 0541 09/03/16 1150  TROPONINI <0.03 <0.03 <0.03   Lab Results  Component Value Date   WBC  5.7 09/03/2016   HGB 14.6 09/03/2016   HCT 45.8 09/03/2016   MCV 82.8 09/03/2016   PLT 203 09/03/2016    Recent Labs Lab 09/02/16 2021 09/03/16 0541  NA 139 141  K 3.1* 3.7  CL 96* 98*  CO2 36* 37*  BUN 13 15  CREATININE 1.31* 1.20  CALCIUM 9.3 9.4  PROT 7.1  --   BILITOT 0.9  --   ALKPHOS 72  --   ALT 22  --   AST 27  --   GLUCOSE 153* 119*   Lab Results  Component Value Date   CHOL 113 09/13/2015   HDL 29 (L) 09/13/2015   LDLCALC 51 09/13/2015   TRIG 164 (H) 09/13/2015   No results found for: DDIMER  Radiology/Studies:  Dg Chest 2 View  Result Date: 09/02/2016 CLINICAL DATA:  Right-sided chest pain EXAM: CHEST  2 VIEW COMPARISON:  August 25, 2016 FINDINGS: There is no edema or consolidation. The heart size and pulmonary vascularity are normal. No adenopathy. There is atherosclerotic calcification in the aorta. Patient is status post internal mammary bypass grafting. There is degenerative change in the thoracic spine. There is calcification in the left carotid artery. IMPRESSION: No edema or consolidation. Aortic atherosclerosis noted. There is also calcification in the left carotid artery. Electronically Signed   By: Lowella Grip III M.D.   On: 09/02/2016 20:56   Dg Chest 2 View  Result Date: 08/25/2016 CLINICAL DATA:  60 year old male with a history of bilateral flank pain for 2 days EXAM: CHEST  2 VIEW COMPARISON:  07/22/2016, 07/14/2016, 06/24/2016 FINDINGS: Cardiomediastinal silhouette unchanged in size and contour. Surgical changes of prior median sternotomy and CABG. Coronary stents visible. Coarsened interstitial markings of the lungs. No pneumothorax or pleural effusion. No confluent airspace disease. Degenerative changes of the spine.  No displaced fracture. IMPRESSION: Chronic lung changes without evidence  of superimposed acute cardiopulmonary disease. Surgical changes of prior median sternotomy and CABG. Coronary stents in place Signed, Dulcy Fanny. Earleen Newport, DO [facility] Radiology Electronically Signed   By: Corrie Mckusick D.O.   On: 08/25/2016 17:19    EKG: Normal sinus rhythm  Weights: Filed Weights   09/02/16 2015 09/03/16 0154  Weight: 110.7 kg (244 lb) 108.1 kg (238 lb 6.4 oz)     Physical Exam: Blood pressure 133/82, pulse 61, temperature 97.4 F (36.3 C), temperature source Oral, resp. rate 19, height 5' 8"  (1.727 m), weight 108.1 kg (238 lb 6.4 oz), SpO2 96 %. Body mass index is 36.25 kg/m. General: Well developed, well nourished, in no acute distress. Head eyes ears nose throat: Normocephalic, atraumatic, sclera non-icteric, no xanthomas, nares are without discharge. No apparent thyromegaly and/or mass  Lungs: Normal respiratory effort.  no wheezes, no rales, no rhonchi.  Heart: RRR with normal S1 S2. no murmur gallop, no rub, PMI is normal size and placement, carotid upstroke normal without bruit, jugular venous pressure is normal Abdomen: Soft, non-tender, non-distended with normoactive bowel sounds. No hepatomegaly. No rebound/guarding. No obvious abdominal masses. Abdominal aorta is normal size without bruit Extremities: No edema. no cyanosis, no clubbing, no ulcers  Peripheral : 2+ bilateral upper extremity pulses, 2+ bilateral femoral pulses, 2+ bilateral dorsal pedal pulse Neuro: Alert and oriented. No facial asymmetry. No focal deficit. Moves all extremities spontaneously. Musculoskeletal: Normal muscle tone without kyphosis Psych:  Responds to questions appropriately with a normal affect.    Assessment: 60 year old male with severe chronic obstructive pulmonary disease with hypoxia  requiring oxygenation essential hypertension diabetes with complications atrial fibrillation paroxysmal nonvalvular in nature in normal rhythm with  the diastolic dysfunction heart failure chronic in nature with coronary artery bypass graft and atypical chest pain without evidence of myocardial infarction  Plan: 1. Continue antianginal medication management from the cardiac standpoint including isosorbide, calcium channel blocker, beta blocker 2. Continue anticoagulation for further risk reduction in stroke with atrial fibrillation 3. No change in antihypertensive medication management with metoprolol and lisinopril with amlodipine 4. No further cardiac intervention at this time due to no evidence of worsening heart failure and/or myocardial infarction 5. Continue supportive care of COPD and hypoxia 6. Begin ambulation and follow for worsening symptoms with possible discharge home from cardiac standpoint  Signed, Corey Skains M.D. [facility] 09/03/2016, 1:13 PM

## 2016-09-03 NOTE — ED Notes (Signed)
Report given to floor RN. Pt taken to floor via stretcher. Vital signs stable prior to transport.

## 2016-09-03 NOTE — Discharge Instructions (Signed)
Resume diet and activity as before ° ° °

## 2016-09-03 NOTE — Progress Notes (Signed)
Notified Dr. Marcille Blanco of patient's continued pain despite receiving ordered pain meds.

## 2016-09-03 NOTE — Progress Notes (Signed)
Initial Nutrition Assessment  DOCUMENTATION CODES:   Obesity unspecified  INTERVENTION:  -Pt PO intake increasing, encourage PO intake -Glucerna Shakes if PO intake begins to fall again  NUTRITION DIAGNOSIS:   Inadequate oral intake related to nausea, other (see comment) (abdominal pain) as evidenced by per patient/family report.  GOAL:   Patient will meet greater than or equal to 90% of their needs  MONITOR:   PO intake, I & O's, Labs, Weight trends  REASON FOR ASSESSMENT:   Malnutrition Screening Tool    ASSESSMENT:   Lawrence [name]  is a 60 y.o. male who presents with Chest pain. Patient states this pain started today while he was at rest. Is nonradiating, started on the right side of his chest and moved substernally. It is a sharp pain, and he states it feels like the pain he had when he had to have stents placed in the past.  Spoke with Lawrence [name] briefly. He endorses poor PO intake pta for approximately 1 week. States he ate little nothing because everything increased his abdominal pain. He states a normal wt of 249#, is currently 238# with a taut, distended abdomen likely masking more wt loss. Pt reports retaining a lot of fluid prior to admission, legs are no longer swollen, but abdomen still is. PO intake is now pretty good, he had a chicken salad sandwich and potato soup today for lunch he ate 100% of, but patient still endorses abdominal pain receiving oxycodone for it. States he is discharging today, encouraged him to consume liquids/glucerna and soft foods if he is unable to eat solid food. Nutrition-Focused physical exam completed. Findings are no fat depletion, no muscle depletion, and moderate-severe abdominal edema.  Labs and medications reviewed.   Diet Order:  Diet heart healthy/carb modified Room service appropriate? Yes; Fluid consistency: Thin  Skin:  Reviewed, no issues  Last BM:  10/18  Height:   Ht Readings from Last 1 Encounters:  09/03/16 5'  8" (1.727 m)    Weight:   Wt Readings from Last 1 Encounters:  09/03/16 238 lb 6.4 oz (108.1 kg)    Ideal Body Weight:  70 kg  BMI:  Body mass index is 36.25 kg/m.  Estimated Nutritional Needs:   Kcal:  1600-2000 calories  Protein:  95-110 gm  Fluid:  >/= 1.6L  EDUCATION NEEDS:   No education needs identified at this time  Lawrence [name]. Lawrence Terhaar, MS, RD LDN Inpatient Clinical Dietitian Pager [phone]

## 2016-09-03 NOTE — Care Management Obs Status (Signed)
[place] NOTIFICATION   Patient Details  Name: Lawrence [name] MRN: [id number] Date of Birth: 1956/02/27   Medicare Observation Status Notification Given:  Yes    Beau Fanny, RN 09/03/2016, 8:45 AM

## 2016-09-03 NOTE — Progress Notes (Signed)
A&O. Admitted from home for chest pain. Medicated for pain at admission. Still complaining of pain. Paged doctor for orders. Waiting cardiology consult.

## 2016-09-03 NOTE — H&P (Signed)
[facility] NAME: Lawrence [name]    MR#:  [id number]  DATE OF BIRTH:  [birth year]  DATE OF ADMISSION:  09/02/2016  PRIMARY CARE PHYSICIAN: [name], MD   REQUESTING/REFERRING PHYSICIAN: Owens Shark, MD  CHIEF COMPLAINT:   Chief Complaint  Patient presents with  . Chest Pain    HISTORY OF PRESENT ILLNESS:  Lawrence [name]  is a 60 y.o. male who presents with Chest pain. Patient states this pain started today while he was at rest. Is nonradiating, started on the right side of his chest and moved substernally. It is a sharp pain, and he states it feels like the pain he had when he had to have stents placed in the past. Here in the ED his workup was negative initially. Given his history) burden of disease hospitals were called for admission and further evaluation.  PAST MEDICAL HISTORY:   Past Medical History:  Diagnosis Date  . A-fib ([place])   . Anemia   . Anxiety   . Asthma   . CAD (coronary artery disease)    a. 2002 CABGx2 (LIMA->LAD, VG->VG->OM1);  b. 09/2012 DES->OM;  c. 03/2015 PTCA of LAD [facility]) in setting of atretic LIMA; d. 05/2015 Cath [facility]): nonobs dzs; e. 06/2015 Cath ([facility]): LM nl, LAD 45p/d ISR, 50d, D1/2 small, LCX 50p/d ISR, OM1 70ost, 30 ISR, VG->OM1 50ost, 78m LIMA->LAD 99p/d - atretic, RCA dom, nl; f.cath 10/16: 40-50%(FFR 0.90) pLAD, 75% (FFR 0.77) mLAD s/p PCI/DES, oRCA 40% (FFR0.95)  . Celiac disease   . Chronic diastolic CHF (congestive heart failure) ([place]    a. 06/2009 Echo: EF 60-65%, Gr 1 DD, triv AI, mildly dil LA, nl RV.  .Marland KitchenCOPD (chronic obstructive pulmonary disease) ([place]    a. Chronic bronchitis and emphysema.  .Marland KitchenDysrhythmia   . Essential hypertension   . History of tobacco abuse    a. Quit 2014.  .Marland KitchenPSVT (paroxysmal supraventricular tachycardia) ([place]    a. 10/2012 Noted on Zio Patch.  . Type II diabetes mellitus ([place]     PAST SURGICAL HISTORY:   Past Surgical History:  Procedure Laterality  Date  . BYPASS GRAFT    . CARDIAC CATHETERIZATION N/A 07/12/2015   rocedure: Left Heart Cath and Cors/Grafts Angiography;  Surgeon: HBelva Crome MD;  [place];  Service: Cardiovascular;  Laterality: N/A;  . CARDIAC CATHETERIZATION Right 10/07/2015   Procedure: Left Heart Cath and Cors/Grafts Angiography;  Surgeon: SDionisio  MD;  [place] [facility];  Service: Cardiovascular;  Laterality: Right;  . CARDIAC CATHETERIZATION N/A 04/06/2016   Procedure: Left Heart Cath and Coronary Angiography;  Surgeon: DYolonda Kida MD;  [place] [facility];  Service: Cardiovascular;  Laterality: N/A;  . CARDIAC CATHETERIZATION  04/06/2016   Procedure: Bypass Graft Angiography;  Surgeon: DYolonda Kida MD;  [place] [facility];  Service: Cardiovascular;;  . CHOLECYSTECTOMY    . ESOPHAGEAL DILATION    . TONSILLECTOMY    . VASCULAR SURGERY      SOCIAL HISTORY:   Social History  Substance Use Topics  . Smoking status: Former Smoker    Quit date: 04/22/2013  . Smokeless tobacco: Never Used  . Alcohol use No     Comment: remotely quit alcohol use. Hx of heavy alcohol use.    FAMILY HISTORY:   Family History  Problem Relation Age of Onset  . Heart attack Mother   . Depression Mother   .  Heart disease Mother   . COPD Mother   . Hypertension Mother   . Heart attack Father   . Diabetes Father   . Depression Father   . Heart disease Father   . Cirrhosis Father   . Parkinson's disease Brother     DRUG ALLERGIES:   Allergies  Allergen Reactions  . Demerol [Meperidine] Hives  . Prednisone Other (See Comments) and Hypertension    Pt states that this medication puts him in A-fib   . Sulfa Antibiotics Hives  . Albuterol Sulfate [Albuterol] Palpitations and Other (See Comments)    Pt currently uses this medication.    . Morphine Sulfate Nausea And Vomiting, Rash and Other (See Comments)    Pt states that he is only allergic to the  tablet form of this medication.      MEDICATIONS AT HOME:   Prior to Admission medications   Medication Sig Start Date End Date Taking? Authorizing Provider  albuterol (PROVENTIL HFA;VENTOLIN HFA) 108 (90 Base) MCG/ACT inhaler Inhale 4-6 puffs into the lungs every 4 (four) hours as needed for wheezing or shortness of breath. Reported on 04/08/2016    Historical Provider, MD  allopurinol (ZYLOPRIM) 100 MG tablet THREE (3) TABLETS TWICE DAILY 04/25/16   Birdie Sons, MD  ALPRAZolam Duanne Moron) 1 MG tablet Take 0.5-1 tablets (0.5-1 mg total) by mouth 3 (three) times daily as needed for anxiety. 05/26/16   Birdie Sons, MD  amLODipine (NORVASC) 10 MG tablet Take 10 mg by mouth daily.    Historical Provider, MD  aspirin 81 MG EC tablet Take 81 mg by mouth daily. Swallow whole.    Historical Provider, MD  atorvastatin (LIPITOR) 80 MG tablet TAKE 1 TABLET BY MOUTH EVERY NIGHT AT BEDTIME. 05/26/16   Birdie Sons, MD  cetirizine (ZYRTEC) 10 MG tablet TAKE ONE TABLET AT BEDTIME 04/19/16   Birdie Sons, MD  cyclobenzaprine (FLEXERIL) 10 MG tablet TAKE 1 TABLET BY MOUTH EVERY 8 HOURS AS NEEEDED FOR MUSCLE SPASM 07/25/16   Birdie Sons, MD  dicyclomine (BENTYL) 20 MG tablet Take 1 tablet (20 mg total) by mouth 3 (three) times daily as needed (abdominal pain). 04/24/16   Nance Pear, MD  ELIQUIS 5 MG TABS tablet TAKE ONE TABLET TWICE DAILY 07/25/16   Birdie Sons, MD  ferrous sulfate 325 (65 FE) MG tablet Take 325 mg by mouth daily with breakfast. 08/03/16 08/03/17  Historical Provider, MD  fluticasone furoate-vilanterol (BREO ELLIPTA) 100-25 MCG/INH AEPB Inhale 1 puff into the lungs daily.     Historical Provider, MD  furosemide (LASIX) 80 MG tablet Take 40 mg by mouth daily.     Historical Provider, MD  glucose blood (ACCU-CHEK AVIVA PLUS) test strip Use to check blood sugar once a day 08/21/16   Birdie Sons, MD  hyoscyamine (LEVSIN SL) 0.125 MG SL tablet Place under the tongue. Place 1 tablet (0.125  mg total) under the tongue every 4 (four) hours as needed for Cramping. 08/18/16 10/17/16  Historical Provider, MD  isosorbide mononitrate (IMDUR) 60 MG 24 hr tablet Take 120 mg by mouth daily. Reported on 04/24/2016    Historical Provider, MD  lansoprazole (PREVACID) 30 MG capsule Take 30 mg by mouth 2 (two) times daily.     Historical Provider, MD  lisinopril (PRINIVIL,ZESTRIL) 5 MG tablet Take 5 mg by mouth daily.    Historical Provider, MD  magnesium oxide (MAG-OX) 400 (241.3 Mg) MG tablet Take 400 mg by mouth daily.  Historical Provider, MD  metFORMIN (GLUCOPHAGE) 500 MG tablet Take 500 mg by mouth daily with breakfast.     Historical Provider, MD  metoprolol tartrate (LOPRESSOR) 25 MG tablet Take 0.5 tablets (12.5 mg total) by mouth 2 (two) times daily. 06/26/16   Epifanio Lesches, MD  nitroGLYCERIN (NITROSTAT) 0.4 MG SL tablet Place 0.4 mg under the tongue every 5 (five) minutes as needed for chest pain. Reported on 05/26/2016    Historical Provider, MD  omega-3 acid ethyl esters (LOVAZA) 1 g capsule TAKE FOUR CAPSULES BY MOUTH DAILY 07/25/16   Birdie Sons, MD  oxyCODONE-acetaminophen (PERCOCET) 10-325 MG tablet Take 1 tablet by mouth every 4 (four) hours as needed. 08/15/16   Birdie Sons, MD  RANEXA 500 MG 12 hr tablet TAKE TWO (2) TABLETS TWICE A DAY 07/25/16   Birdie Sons, MD  sotalol (BETAPACE) 120 MG tablet Take 120 mg by mouth daily.    Historical Provider, MD  sucralfate (CARAFATE) 1 g tablet Take 1 tablet (1 g total) by mouth 2 (two) times daily. 04/22/16   [name], MD  tamsulosin (FLOMAX) 0.4 MG CAPS capsule Take 0.4 mg by mouth daily after breakfast.     Historical Provider, MD  tiotropium (SPIRIVA) 18 MCG inhalation capsule Place 18 mcg into inhaler and inhale daily.    Historical Provider, MD    REVIEW OF SYSTEMS:  Review of Systems  Constitutional: Negative for chills, fever, malaise/fatigue and weight loss.  HENT: Negative for ear pain, hearing loss and  tinnitus.   Eyes: Negative for blurred vision, double vision, pain and redness.  Respiratory: Negative for cough, hemoptysis and shortness of breath.   Cardiovascular: Positive for chest pain. Negative for palpitations, orthopnea and leg swelling.  Gastrointestinal: Negative for abdominal pain, constipation, diarrhea, nausea and vomiting.  Genitourinary: Negative for dysuria, frequency and hematuria.  Musculoskeletal: Negative for back pain, joint pain and neck pain.  Skin:       No acne, rash, or lesions  Neurological: Negative for dizziness, tremors, focal weakness and weakness.  Endo/Heme/Allergies: Negative for polydipsia. Does not bruise/bleed easily.  Psychiatric/Behavioral: Negative for depression. The patient is not nervous/anxious and does not have insomnia.      VITAL SIGNS:   Vitals:   09/02/16 2012 09/02/16 2015 09/02/16 2310 09/02/16 2330  BP: (!) 142/79  (!) 143/84 131/77  Pulse: 71  (!) 59 (!) 53  Resp: 20  19 14   Temp: 97.6 F (36.4 C)     TempSrc: Oral     SpO2: 94% 94% 95% 91%  Weight:  110.7 kg (244 lb)    Height:  5' 7"  (1.702 m)     Wt Readings from Last 3 Encounters:  09/02/16 110.7 kg (244 lb)  08/25/16 110.7 kg (244 lb)  08/18/16 112.5 kg (248 lb)    PHYSICAL EXAMINATION:  Physical Exam  Vitals reviewed. Constitutional: He is oriented to person, place, and time. He appears well-developed and well-nourished. No distress.  HENT:  Head: Normocephalic and atraumatic.  Mouth/Throat: Oropharynx is clear and moist.  Eyes: Conjunctivae and EOM are normal. Pupils are equal, round, and reactive to light. No scleral icterus.  Neck: Normal range of motion. Neck supple. No JVD present. No thyromegaly present.  Cardiovascular: Normal rate, regular rhythm and intact distal pulses.  Exam reveals no gallop and no friction rub.   No murmur heard. Respiratory: Effort normal and breath sounds normal. No respiratory distress. He has no wheezes. He has no rales.  GI:  Soft. Bowel sounds are normal. He exhibits no distension. There is no tenderness.  Musculoskeletal: Normal range of motion. He exhibits no edema.  No arthritis, no gout  Lymphadenopathy:    He has no cervical adenopathy.  Neurological: He is alert and oriented to person, place, and time. No cranial nerve deficit.  No dysarthria, no aphasia  Skin: Skin is warm and dry. No rash noted. No erythema.  Psychiatric: He has a normal mood and affect. His behavior is normal. Judgment and thought content normal.    LABORATORY PANEL:   CBC  Recent Labs Lab 09/02/16 2021  WBC 6.9  HGB 15.2  HCT 47.9  PLT 210   ------------------------------------------------------------------------------------------------------------------  Chemistries   Recent Labs Lab 09/02/16 2021  NA 139  K 3.1*  CL 96*  CO2 36*  GLUCOSE 153*  BUN 13  CREATININE 1.31*  CALCIUM 9.3  AST 27  ALT 22  ALKPHOS 72  BILITOT 0.9   ------------------------------------------------------------------------------------------------------------------  Cardiac Enzymes  Recent Labs Lab 09/02/16 2021  TROPONINI <0.03   ------------------------------------------------------------------------------------------------------------------  RADIOLOGY:  Dg Chest 2 View  Result Date: 09/02/2016 CLINICAL DATA:  Right-sided chest pain EXAM: CHEST  2 VIEW COMPARISON:  August 25, 2016 FINDINGS: There is no edema or consolidation. The heart size and pulmonary vascularity are normal. No adenopathy. There is atherosclerotic calcification in the aorta. Patient is status post internal mammary bypass grafting. There is degenerative change in the thoracic spine. There is calcification in the left carotid artery. IMPRESSION: No edema or consolidation. Aortic atherosclerosis noted. There is also calcification in the left carotid artery. Electronically Signed   By: Lowella Grip III M.D.   On: 09/02/2016 20:56    EKG:   Orders  placed or performed during the hospital encounter of 09/02/16  . EKG 12-Lead  . EKG 12-Lead  . EKG 12-Lead  . EKG 12-Lead  . ED EKG  . ED EKG    IMPRESSION AND PLAN:  Principal Problem:   Unstable angina (HCC) - patient has a history of prior stent placement and is on significant medical management for his cardiac disease. First troponin here tonight is negative, we will bring him in for observation, trend his cardiac enzymes. We'll get a cardiology consult. We'll defer to their recommendations for further workup including echocardiogram or stress test or catheterization. Active Problems:   CAD (coronary artery disease) - continue home meds, other workup as above   HTN (hypertension) - continue home meds   DM (diabetes mellitus) ([place]) - sliding scale insulin with corresponding glucose checks and a carb modified/heart healthy diet   Chronic diastolic CHF (congestive heart failure) ([place]) - continue home meds, does not seem to be an exacerbation   PAF (paroxysmal atrial fibrillation) (HCC) - currently sinus rhythm, continue home meds including anticoagulation   GERD (gastroesophageal reflux disease) - home dose PPI   COPD (chronic obstructive pulmonary disease) ([place]) - continue home meds  All the records are reviewed and case discussed with ED provider. Management plans discussed with the patient and/or family.  DVT PROPHYLAXIS: Systemic anticoagulation  GI PROPHYLAXIS: PPI  ADMISSION STATUS: Inpatient  CODE STATUS: Full Code Status History    Date Active Date Inactive Code Status Order ID Comments User Context   06/25/2016 12:36 AM 06/26/2016  3:35 PM Full Code [id number]  Lance Coon, MD ED   04/03/2016  8:55 PM 04/07/2016  5:59 PM Full Code [id number]  Theodoro Grist, MD Inpatient   03/26/2016 10:24 PM 03/28/2016  6:00  PM Full Code [id number]  Lance Coon, MD Inpatient   10/07/2015  9:36 AM 10/07/2015  6:30 PM Full Code [id number]  Dionisio Ayianna Darnold, MD Inpatient   10/07/2015  8:26 AM  10/07/2015  9:36 AM Full Code [id number]  Dionisio Chalmer Zheng, MD Inpatient   10/05/2015  7:29 PM 10/07/2015  8:26 AM Full Code [id number]  Idelle Crouch, MD Inpatient   09/12/2015  9:29 PM 09/14/2015  2:25 PM Full Code [id number]  Demetrios Loll, MD Inpatient   08/01/2015  5:21 AM 08/02/2015  1:46 PM Full Code [id number]  Lance Coon, MD Inpatient   07/12/2015  3:24 PM 07/13/2015  8:17 PM Full Code [id number]  Belva Crome, MD Inpatient   07/11/2015  8:00 PM 07/12/2015  3:24 PM Full Code [id number]  Lonn Georgia, PA-C Inpatient   06/26/2015  5:43 AM 06/27/2015  3:30 PM Full Code [id number]  Juluis Mire, MD Inpatient      TOTAL TIME TAKING CARE OF THIS PATIENT: 45 minutes.    Jaye Polidori Mauldin 09/03/2016, 12:32 AM  [facility]  Office  [phone]  CC: Primary care physician; [name], MD

## 2016-09-03 NOTE — Progress Notes (Signed)
Patient is discharge home in a stable condition, medications return to pt on discharge , summary and f/u care given , left with wife

## 2016-09-04 ENCOUNTER — Other Ambulatory Visit: Payer: Self-pay | Admitting: *Deleted

## 2016-09-04 NOTE — Patient Outreach (Signed)
Follow up phone call successful- recent ED visit/observation 10/18-10/19 unstable angina.    Spoke with pt, HIPAA verified, discussed recent  In patient stay (<24 hours) to which pt reports they gave him pain medication, thought it was his heart, it is his stomach.  Pt reports had trouble with his pancreas before, talked with Dr. Vira Agar yesterday, to follow up with MD's NP but need to do stool sample first - check for blood.  Pt reports to have endoscopy and colonoscopy if blood present.  Pt reports still has abdominal pain, +8 now, been staying at that, want to find out the cause. Pt reports been taking Oxycodone 10-325 mg for his  back, does not help the stomach.  Pt reports they did not want to put him on more pain medications.  Pt reports no medication changes. Pt reports currently no edema in lower legs, weight now 234 lbs, been coming down since Heart MD started him on Torsemide.   Pt reports saw Dr. [name] 2 weeks ago, to follow up again in mid December.     Plan: As discussed with pt, plan to do home visit next week (this week's home visit cancelled due to patient being in patient).  Was informed by pt moved to another [place] [place] room [address].      Zara Chess.   [facility]  [phone]

## 2016-09-04 NOTE — Discharge Summary (Signed)
[facility] NAME: Lawrence [name]    MR#:  [id number]  DATE OF BIRTH:  1956-05-20  DATE OF ADMISSION:  09/02/2016 ADMITTING PHYSICIAN: Lance Coon, MD  DATE OF DISCHARGE: 09/03/2016  3:41 PM  PRIMARY CARE PHYSICIAN: [name], MD   ADMISSION DIAGNOSIS:  Chest pain, unspecified type [R07.9]  DISCHARGE DIAGNOSIS:  Principal Problem:   Unstable angina ([place]) Active Problems:   CAD (coronary artery disease)   HTN (hypertension)   DM (diabetes mellitus) (HCC)   GERD (gastroesophageal reflux disease)   COPD (chronic obstructive pulmonary disease) (HCC)   Chronic diastolic CHF (congestive heart failure) (HCC)   PAF (paroxysmal atrial fibrillation) ([place])   SECONDARY DIAGNOSIS:   Past Medical History:  Diagnosis Date  . A-fib ([place])   . Anemia   . Anxiety   . Asthma   . CAD (coronary artery disease)    a. 2002 CABGx2 (LIMA->LAD, VG->VG->OM1);  b. 09/2012 DES->OM;  c. 03/2015 PTCA of LAD [facility]) in setting of atretic LIMA; d. 05/2015 Cath [facility]): nonobs dzs; e. 06/2015 Cath ([facility]): LM nl, LAD 45p/d ISR, 50d, D1/2 small, LCX 50p/d ISR, OM1 70ost, 30 ISR, VG->OM1 50ost, 59m LIMA->LAD 99p/d - atretic, RCA dom, nl; f.cath 10/16: 40-50%(FFR 0.90) pLAD, 75% (FFR 0.77) mLAD s/p PCI/DES, oRCA 40% (FFR0.95)  . Celiac disease   . Chronic diastolic CHF (congestive heart failure) ([place]    a. 06/2009 Echo: EF 60-65%, Gr 1 DD, triv AI, mildly dil LA, nl RV.  .Marland KitchenCOPD (chronic obstructive pulmonary disease) ([place]    a. Chronic bronchitis and emphysema.  .Marland KitchenDysrhythmia   . Essential hypertension   . History of tobacco abuse    a. Quit 2014.  .Marland KitchenPSVT (paroxysmal supraventricular tachycardia) ([place]    a. 10/2012 Noted on [facility].  . Type II diabetes mellitus ([place]      ADMITTING HISTORY  Lawrence [name] is a 60y.o. male who presents with Chest pain. Patient states this pain started today while he was at rest. Is nonradiating, started on the right  side of his chest and moved substernally. It is a sharp pain, and he states it feels like the pain he had when he had to have stents placed in the past. Here in the ED his workup was negative initially. Given his history) burden of disease hospitals were called for admission and further evaluation.  HOSPITAL COURSE:   * Chest and abdominal pain This has been a chronic issue. Patient has seen cardiology, pulmonary and GI for the same problem. He has had cardiac catheterization without intervention. Endoscopy is being considered as outpatient. Patient's troponin is normal. EKG shows nothing acute. Discussed with Dr. KNehemiah Massedof cardiology who thought this was atypical for cardiac pain. Patient is being discharged home on appropriate medications along with antacids. He will need to follow-up with GI for further workup of his chronic abdominal pain.  Indications and comorbidities remain stable.  CONSULTS OBTAINED:  Treatment Team:  BCorey Skains MD  DRUG ALLERGIES:   Allergies  Allergen Reactions  . Demerol [Meperidine] Hives  . Prednisone Other (See Comments) and Hypertension    Pt states that this medication puts him in A-fib   . Sulfa Antibiotics Hives  . Albuterol Sulfate [Albuterol] Palpitations and Other (See Comments)    Pt currently uses this medication.    . Morphine Sulfate Nausea And Vomiting, Rash and Other (See Comments)    Pt states that he is  only allergic to the tablet form of this medication.      DISCHARGE MEDICATIONS:   Discharge Medication List as of 09/03/2016  3:00 PM    CONTINUE these medications which have NOT CHANGED   Details  albuterol (PROVENTIL HFA;VENTOLIN HFA) 108 (90 Base) MCG/ACT inhaler Inhale 4-6 puffs into the lungs every 4 (four) hours as needed for wheezing or shortness of breath. Reported on 04/08/2016, Historical Med    allopurinol (ZYLOPRIM) 100 MG tablet THREE (3) TABLETS TWICE DAILY, Normal    ALPRAZolam (XANAX) 1 MG tablet Take 0.5-1  tablets (0.5-1 mg total) by mouth 3 (three) times daily as needed for anxiety., Starting Tue 05/26/2016, Phone In    amLODipine (NORVASC) 10 MG tablet Take 10 mg by mouth daily., Historical Med    aspirin 81 MG EC tablet Take 81 mg by mouth daily. Swallow whole., Historical Med    atorvastatin (LIPITOR) 80 MG tablet TAKE 1 TABLET BY MOUTH EVERY NIGHT AT BEDTIME., Normal    budesonide-formoterol (SYMBICORT) 160-4.5 MCG/ACT inhaler Inhale 2 puffs into the lungs 2 (two) times daily., Historical Med    cetirizine (ZYRTEC) 10 MG tablet TAKE ONE TABLET AT BEDTIME, Normal    cyclobenzaprine (FLEXERIL) 10 MG tablet TAKE 1 TABLET BY MOUTH EVERY 8 HOURS AS NEEEDED FOR MUSCLE SPASM, Normal    ELIQUIS 5 MG TABS tablet TAKE ONE TABLET TWICE DAILY, Normal    ferrous sulfate 325 (65 FE) MG tablet Take 325 mg by mouth daily with breakfast., Starting Mon 08/03/2016, Until Tue 08/03/2017, Historical Med    fluticasone furoate-vilanterol (BREO ELLIPTA) 100-25 MCG/INH AEPB Inhale 1 puff into the lungs daily. , Historical Med    glucose blood (ACCU-CHEK AVIVA PLUS) test strip Use to check blood sugar once a day, Normal    lansoprazole (PREVACID) 30 MG capsule Take 30 mg by mouth 2 (two) times daily. , Historical Med    magnesium oxide (MAG-OX) 400 (241.3 Mg) MG tablet Take 400 mg by mouth daily., Historical Med    metoprolol tartrate (LOPRESSOR) 25 MG tablet Take 0.5 tablets (12.5 mg total) by mouth 2 (two) times daily., Starting Fri 06/26/2016, Normal    nitroGLYCERIN (NITROSTAT) 0.4 MG SL tablet Place 0.4 mg under the tongue every 5 (five) minutes as needed for chest pain. Reported on 05/26/2016, Historical Med    omega-3 acid ethyl esters (LOVAZA) 1 g capsule TAKE FOUR CAPSULES BY MOUTH DAILY, Normal    oxyCODONE-acetaminophen (PERCOCET) 10-325 MG tablet Take 1 tablet by mouth every 4 (four) hours as needed., Starting Sat 08/15/2016, Print    RANEXA 500 MG 12 hr tablet TAKE TWO (2) TABLETS TWICE A DAY,  Normal    senna (SENOKOT) 8.6 MG TABS tablet Take 2 tablets by mouth daily., Historical Med    sotalol (BETAPACE) 120 MG tablet Take 120 mg by mouth daily., Historical Med    sucralfate (CARAFATE) 1 g tablet Take 1 tablet (1 g total) by mouth 2 (two) times daily., Starting Wed 04/22/2016, Print    tamsulosin (FLOMAX) 0.4 MG CAPS capsule Take 0.4 mg by mouth daily after breakfast. , Historical Med    tiotropium (SPIRIVA) 18 MCG inhalation capsule Place 18 mcg into inhaler and inhale daily., Historical Med    torsemide (DEMADEX) 100 MG tablet Take 100 mg by mouth daily., Historical Med      STOP taking these medications     isosorbide mononitrate (IMDUR) 60 MG 24 hr tablet      furosemide (LASIX) 80 MG tablet  lisinopril (PRINIVIL,ZESTRIL) 5 MG tablet         Today   VITAL SIGNS:  Blood pressure 133/82, pulse 61, temperature 97.4 F (36.3 C), temperature source Oral, resp. rate 19, height 5' 8"  (1.727 m), weight 108.1 kg (238 lb 6.4 oz), SpO2 96 %.  I/O:   Intake/Output Summary (Last 24 hours) at 09/04/16 1436 Last data filed at 09/03/16 1500  Gross per 24 hour  Intake                0 ml  Output              200 ml  Net             -200 ml    PHYSICAL EXAMINATION:  Physical Exam  GENERAL:  60 y.o.-year-old patient lying in the bed with no acute distress. Obese LUNGS: Normal breath sounds bilaterally, no wheezing, rales,rhonchi or crepitation. No use of accessory muscles of respiration.  CARDIOVASCULAR: S1, S2 normal. No murmurs, rubs, or gallops.  ABDOMEN: Soft, non-tender, non-distended. Bowel sounds present. No organomegaly or mass.  NEUROLOGIC: Moves all 4 extremities. PSYCHIATRIC: The patient is alert and oriented x 3.  SKIN: No obvious rash, lesion, or ulcer.   DATA REVIEW:   CBC  Recent Labs Lab 09/03/16 0541  WBC 5.7  HGB 14.6  HCT 45.8  PLT 203    Chemistries   Recent Labs Lab 09/02/16 2021 09/03/16 0541  NA 139 141  K 3.1* 3.7  CL  96* 98*  CO2 36* 37*  GLUCOSE 153* 119*  BUN 13 15  CREATININE 1.31* 1.20  CALCIUM 9.3 9.4  AST 27  --   ALT 22  --   ALKPHOS 72  --   BILITOT 0.9  --     Cardiac Enzymes  Recent Labs Lab 09/03/16 1150  TROPONINI <0.03    Microbiology Results  Results for orders placed or performed during the hospital encounter of 07/11/15  MRSA PCR Screening     Status: None   Collection Time: 07/11/15  9:09 PM  Result Value Ref Range Status   MRSA by PCR NEGATIVE NEGATIVE Final    Comment:        The GeneXpert MRSA Assay (FDA approved for NASAL specimens only), is one component of a comprehensive MRSA colonization surveillance program. It is not intended to diagnose MRSA infection nor to guide or monitor treatment for MRSA infections.     RADIOLOGY:  Dg Chest 2 View  Result Date: 09/02/2016 CLINICAL DATA:  Right-sided chest pain EXAM: CHEST  2 VIEW COMPARISON:  August 25, 2016 FINDINGS: There is no edema or consolidation. The heart size and pulmonary vascularity are normal. No adenopathy. There is atherosclerotic calcification in the aorta. Patient is status post internal mammary bypass grafting. There is degenerative change in the thoracic spine. There is calcification in the left carotid artery. IMPRESSION: No edema or consolidation. Aortic atherosclerosis noted. There is also calcification in the left carotid artery. Electronically Signed   By: Lowella Grip III M.D.   On: 09/02/2016 20:56    Follow up with PCP in 1 week.  Management plans discussed with the patient, family and they are in agreement.  CODE STATUS:  Code Status History    Date Active Date Inactive Code Status Order ID Comments User Context   09/03/2016  1:43 AM 09/03/2016  6:46 PM Full Code [id number]  Lance Coon, MD ED   06/25/2016 12:36 AM 06/26/2016  3:35 PM Full Code  [id number]  Lance Coon, MD ED   04/03/2016  8:55 PM 04/07/2016  5:59 PM Full Code [id number]  Theodoro Grist, MD Inpatient   03/26/2016  10:24 PM 03/28/2016  6:00 PM Full Code [id number]  Lance Coon, MD Inpatient   10/07/2015  9:36 AM 10/07/2015  6:30 PM Full Code [id number]  Dionisio David, MD Inpatient   10/07/2015  8:26 AM 10/07/2015  9:36 AM Full Code [id number]  Dionisio David, MD Inpatient   10/05/2015  7:29 PM 10/07/2015  8:26 AM Full Code [id number]  Idelle Crouch, MD Inpatient   09/12/2015  9:29 PM 09/14/2015  2:25 PM Full Code [id number]  Demetrios Loll, MD Inpatient   08/01/2015  5:21 AM 08/02/2015  1:46 PM Full Code [id number]  Lance Coon, MD Inpatient   07/12/2015  3:24 PM 07/13/2015  8:17 PM Full Code [id number]  Belva Crome, MD Inpatient   07/11/2015  8:00 PM 07/12/2015  3:24 PM Full Code [id number]  Lonn Georgia, PA-C Inpatient   06/26/2015  5:43 AM 06/27/2015  3:30 PM Full Code [id number]  Juluis Mire, MD Inpatient      TOTAL TIME TAKING CARE OF THIS PATIENT ON DAY OF DISCHARGE: more than 30 minutes.   Hillary Bow R M.D on 09/04/2016 at 2:36 PM  Between 7am to 6pm - Pager - [phone]  After 6pm go to [email or web address] - password [email or web address] [facility]  Office  [phone]  CC: Primary care physician; [name], MD  Note: This dictation was prepared with Dragon dictation along with smaller phrase technology. Any transcriptional errors that result from this process are unintentional.

## 2016-09-07 ENCOUNTER — Telehealth: Payer: Self-pay | Admitting: Family Medicine

## 2016-09-07 NOTE — Telephone Encounter (Signed)
Heather with [employer] called to report Mr. Orlick is [place]. With unspecified chest pain.  This is just FYI.  Needs no reply.  Thanks, [employer]

## 2016-09-08 ENCOUNTER — Other Ambulatory Visit: Payer: Self-pay | Admitting: *Deleted

## 2016-09-08 DIAGNOSIS — R1012 Left upper quadrant pain: Secondary | ICD-10-CM | POA: Diagnosis not present

## 2016-09-08 DIAGNOSIS — D509 Iron deficiency anemia, unspecified: Secondary | ICD-10-CM | POA: Diagnosis not present

## 2016-09-08 NOTE — Patient Outreach (Signed)
[facility]) Care Management   09/08/2016  Lawrence [name] 1956-09-07 [id number]  Lawrence [name] is an 60 y.o. male  Subjective: Pt reports went for lab work today at GI MD's office, still need to bring stool sample.   Pt reports continues to have abdominal pain, stay nauseous, taking pain medication- not helping.  Pt reports was instructed by MD to keep up with his weights, scale in storage but was offered a free scale to which will follow on.  Pt reports his weight today at MD office was 234 lbs.  Pt reports no swelling in legs, was cleared by Heart MD while in the hospital chest pain not heart related,  to see Heart MD 11/1, Lung MD 11/8.   Pt reports he will continue to stay at motel with family, currently can't come up with deposit for apartment.   Objective:   Vitals:   09/08/16 1318 09/08/16 1407  BP: (!) 145/74 120/80  Pulse: 61   Resp: 20     ROS  Physical Exam  Constitutional: He is oriented to person, place, and time. He appears well-developed and well-nourished.  Cardiovascular: Normal rate, regular rhythm and normal heart sounds.   Assessed by nursing student   Respiratory: Effort normal and breath sounds normal.  Assessed by nursing student   GI: Soft. Bowel sounds are normal. He exhibits distension.  Musculoskeletal: Normal range of motion. He exhibits edema.  Abdomen distended.   Neurological: He is alert and oriented to person, place, and time.  Skin: Skin is warm and dry.  Psychiatric: He has a normal mood and affect. His behavior is normal. Judgment and thought content normal.    Encounter Medications:   Outpatient Encounter Prescriptions as of 09/08/2016  Medication Sig Note  . albuterol (PROVENTIL HFA;VENTOLIN HFA) 108 (90 Base) MCG/ACT inhaler Inhale 4-6 puffs into the lungs every 4 (four) hours as needed for wheezing or shortness of breath. Reported on 04/08/2016 09/08/2016: As needed.   Marland Kitchen allopurinol (ZYLOPRIM) 100 MG tablet THREE (3)  TABLETS TWICE DAILY   . ALPRAZolam (XANAX) 1 MG tablet Take 0.5-1 tablets (0.5-1 mg total) by mouth 3 (three) times daily as needed for anxiety.   Marland Kitchen amLODipine (NORVASC) 10 MG tablet Take 10 mg by mouth daily.   Marland Kitchen aspirin 81 MG EC tablet Take 81 mg by mouth daily. Swallow whole.   Marland Kitchen atorvastatin (LIPITOR) 80 MG tablet TAKE 1 TABLET BY MOUTH EVERY NIGHT AT BEDTIME.   . budesonide-formoterol (SYMBICORT) 160-4.5 MCG/ACT inhaler Inhale 2 puffs into the lungs 2 (two) times daily.   . cetirizine (ZYRTEC) 10 MG tablet TAKE ONE TABLET AT BEDTIME   . cyclobenzaprine (FLEXERIL) 10 MG tablet TAKE 1 TABLET BY MOUTH EVERY 8 HOURS AS NEEEDED FOR MUSCLE SPASM 09/08/2016: As needed.   Marland Kitchen ELIQUIS 5 MG TABS tablet TAKE ONE TABLET TWICE DAILY   . ferrous sulfate 325 (65 FE) MG tablet Take 325 mg by mouth daily with breakfast.   . glucose blood (ACCU-CHEK AVIVA PLUS) test strip Use to check blood sugar once a day   . lansoprazole (PREVACID) 30 MG capsule Take 30 mg by mouth 2 (two) times daily.    . magnesium oxide (MAG-OX) 400 (241.3 Mg) MG tablet Take 400 mg by mouth daily.   . metFORMIN (GLUCOPHAGE) 500 MG tablet Take by mouth daily. Pt takes one daily   . omega-3 acid ethyl esters (LOVAZA) 1 g capsule TAKE FOUR CAPSULES BY MOUTH DAILY   . oxyCODONE-acetaminophen (  PERCOCET) 10-325 MG tablet Take 1 tablet by mouth every 4 (four) hours as needed.   Marland Kitchen RANEXA 500 MG 12 hr tablet TAKE TWO (2) TABLETS TWICE A DAY   . senna (SENOKOT) 8.6 MG TABS tablet Take 2 tablets by mouth daily.   . sotalol (BETAPACE) 120 MG tablet Take 120 mg by mouth daily. 09/08/2016: Pt taking twice a day   . tamsulosin (FLOMAX) 0.4 MG CAPS capsule Take 0.4 mg by mouth daily after breakfast.    . tiotropium (SPIRIVA) 18 MCG inhalation capsule Place 18 mcg into inhaler and inhale daily.   Marland Kitchen torsemide (DEMADEX) 100 MG tablet Take 100 mg by mouth daily.   . fluticasone furoate-vilanterol (BREO ELLIPTA) 100-25 MCG/INH AEPB Inhale 1 puff into the  lungs daily.  09/08/2016: Pt to take once finished with Symbicort   . metoprolol tartrate (LOPRESSOR) 25 MG tablet Take 0.5 tablets (12.5 mg total) by mouth 2 (two) times daily. (Patient not taking: Reported on 09/08/2016)   . nitroGLYCERIN (NITROSTAT) 0.4 MG SL tablet Place 0.4 mg under the tongue every 5 (five) minutes as needed for chest pain. Reported on 05/26/2016 09/08/2016: Available if needed.   . sucralfate (CARAFATE) 1 g tablet Take 1 tablet (1 g total) by mouth 2 (two) times daily. (Patient not taking: Reported on 09/08/2016)    No facility-administered encounter medications on file as of 09/08/2016.     Functional Status:   In your present state of health, do you have any difficulty performing the following activities: 09/03/2016 07/08/2016  Hearing? N -  Vision? N -  Difficulty concentrating or making decisions? N -  Walking or climbing stairs? Y -  Dressing or bathing? N -  Doing errands, shopping? Y -  Conservation officer, nature and eating ? - N  Using the Toilet? - N  Managing your Medications? - N  Managing your Finances? - N  Some recent data might be hidden    Fall/Depression Screening:    PHQ 2/9 Scores 04/16/2016 09/06/2015  PHQ - 2 Score 0 0  PHQ- 9 Score - 2    Assessment:  Pleasant 60 year old gentleman, resting in bed upon arrival.   Pt resides with spouse, son/family in motel.           HF- no edema, no c/o chest pain.          Abdominal distention/pain: pain + 8 today, abdomen soft/distended.      Plan:  Pt to follow up with Heart MD 11/1, Lung MD 11/8.            As discussed, pt to follow up on getting free scale,              Start weighing, recording.             Pt to return stool sample soon.             RN CM to continue to provide pt with community nurse              Case management services, follow up again telephonically               In 7 days, check on status, see if stool sample provided              To GI MD office.             Plan to send Dr. [name]  quarterly update letter, route  10/24 home visit.   [facility] CM Care Plan Problem One   Flowsheet Row Most Recent Value  Care Plan Problem One  Better management of HF   Role Documenting the Problem One  Care Management Coordinator  Care Plan for Problem One  Active  [facility] Long Term Goal (31-90 days)  reestablished-pt's management of HF would improve within the next 60 days   [facility] Long Term Goal Start Date  09/09/16  Interventions for Problem One Long Term Goal  Reinforced with pt to follow up on getting a scale, start weighing per MD order.   [facility] CM Short Term Goal #1 (0-30 days)  chest/abdominal pain would improve in the next 30 days   [facility] CM Short Term Goal #1 Start Date  07/30/16  [facility] CM Short Term Goal #2 (0-30 days)  Pt would have swelling under control within the next 30 days   [facility] CM Short Term Goal #2 Start Date  07/30/16  [facility] CM Short Term Goal #3 (0-30 days)  Pt would start weighing,recording within the next 7 days   [facility] CM Short Term Goal #3 Start Date  09/08/16  Interventions for Short Tern Goal #3  Discussed with pt with abdomen distended, importance of weighing,recording.  pt to f/u with Heart MD 11/1    [facility] CM Care Plan Problem Two   Flowsheet Row Most Recent Value  Care Plan Problem Two  Pt will see a decrease in abdominal pain/distention within the next 30 days   Role Documenting the Problem Two  Care Management [place] for Problem Two  Active  Interventions for Problem Two Long Term Goal   Reinforced need for pt to bring stool sample to GI MD's office, test,determine next step for pt.    [facility] Long Term Goal (31-90) days  Pt will provide stool sample to GI MD office within the next 7 days   [facility] Long Term Goal Start Date  09/08/16     Zara Chess.   [place] Care Management  [phone]

## 2016-09-09 ENCOUNTER — Encounter: Payer: Self-pay | Admitting: *Deleted

## 2016-09-09 DIAGNOSIS — G4733 Obstructive sleep apnea (adult) (pediatric): Secondary | ICD-10-CM | POA: Diagnosis not present

## 2016-09-11 ENCOUNTER — Other Ambulatory Visit: Payer: Self-pay | Admitting: Family Medicine

## 2016-09-11 DIAGNOSIS — J439 Emphysema, unspecified: Secondary | ICD-10-CM | POA: Diagnosis not present

## 2016-09-11 DIAGNOSIS — G4710 Hypersomnia, unspecified: Secondary | ICD-10-CM | POA: Diagnosis not present

## 2016-09-11 DIAGNOSIS — J449 Chronic obstructive pulmonary disease, unspecified: Secondary | ICD-10-CM | POA: Diagnosis not present

## 2016-09-11 DIAGNOSIS — I10 Essential (primary) hypertension: Secondary | ICD-10-CM | POA: Diagnosis not present

## 2016-09-11 MED ORDER — OXYCODONE-ACETAMINOPHEN 10-325 MG PO TABS: 1.0000 | ORAL_TABLET | ORAL | 0 refills | Status: DC | PRN

## 2016-09-11 NOTE — Telephone Encounter (Signed)
Pt contacted office for refill request on the following medications: oxyCODONE-acetaminophen (PERCOCET) 10-325 MG tablet Last written: 08/15/16 Last OV: 08/18/16 Please advise. Thanks TNP

## 2016-09-15 ENCOUNTER — Other Ambulatory Visit: Payer: Self-pay | Admitting: *Deleted

## 2016-09-15 DIAGNOSIS — R1012 Left upper quadrant pain: Secondary | ICD-10-CM | POA: Diagnosis not present

## 2016-09-15 DIAGNOSIS — D509 Iron deficiency anemia, unspecified: Secondary | ICD-10-CM | POA: Diagnosis not present

## 2016-09-15 NOTE — Patient Outreach (Addendum)
9:40 am - Received a return phone call from pt to voice message left earlier at 9:36 am.   Spoke with pt, HIPAA verified.  Pt reports it is going good, delivered stool sample 10/27 to GI MD's office, have not heard back.   Pt reports he did not pick up scale yet, going to wait for test (stool sample)results first, then ask the nurse about  it.   Pt reports it was the GI MD that offered him a free scale.   Pt reports abdomen still distended, uncomfortable, pain currently +8, pain medication not helping.  Pt reports no edema in legs, taking diuretic daily, sob all the time because of his COPD/no change, no complaints of chest pain.  Pt confirmed to see Dr. [name] tomorrow.  Pt reports he still has not found an apartment and when he does will need to come up with the deposit.  Pt reports he is okay staying in motel now, able to afford.     Plan: As discussed with pt, plan to follow up again this month for home visit.    Zara Chess.   [place] Care Management  [phone]

## 2016-09-15 NOTE — Patient Outreach (Signed)
9:36 am - Attempt made to contact pt, follow up from last week's home visit, check on status.    HIPAA compliant voice message left with contact number.     Zara Chess.   [place] Care Management  [phone]

## 2016-09-16 DIAGNOSIS — I10 Essential (primary) hypertension: Secondary | ICD-10-CM | POA: Diagnosis not present

## 2016-09-16 DIAGNOSIS — K219 Gastro-esophageal reflux disease without esophagitis: Secondary | ICD-10-CM | POA: Diagnosis not present

## 2016-09-16 DIAGNOSIS — I25118 Atherosclerotic heart disease of native coronary artery with other forms of angina pectoris: Secondary | ICD-10-CM | POA: Diagnosis not present

## 2016-09-16 DIAGNOSIS — R238 Other skin changes: Secondary | ICD-10-CM | POA: Diagnosis not present

## 2016-09-16 DIAGNOSIS — E782 Mixed hyperlipidemia: Secondary | ICD-10-CM | POA: Diagnosis not present

## 2016-09-16 DIAGNOSIS — R079 Chest pain, unspecified: Secondary | ICD-10-CM | POA: Diagnosis not present

## 2016-09-16 DIAGNOSIS — E119 Type 2 diabetes mellitus without complications: Secondary | ICD-10-CM | POA: Diagnosis not present

## 2016-09-16 DIAGNOSIS — R0602 Shortness of breath: Secondary | ICD-10-CM | POA: Diagnosis not present

## 2016-09-23 DIAGNOSIS — R0602 Shortness of breath: Secondary | ICD-10-CM | POA: Diagnosis not present

## 2016-09-30 ENCOUNTER — Ambulatory Visit: Payer: Commercial Managed Care - HMO | Admitting: Family Medicine

## 2016-09-30 IMAGING — CR DG CHEST 1V PORT
1 series · 1 of 1 positions shown · non-contrast
Comparison: 09/22/2014

CLINICAL DATA: Upper abdominal pain.

EXAM:
PORTABLE CHEST - 1 VIEW

[ap]
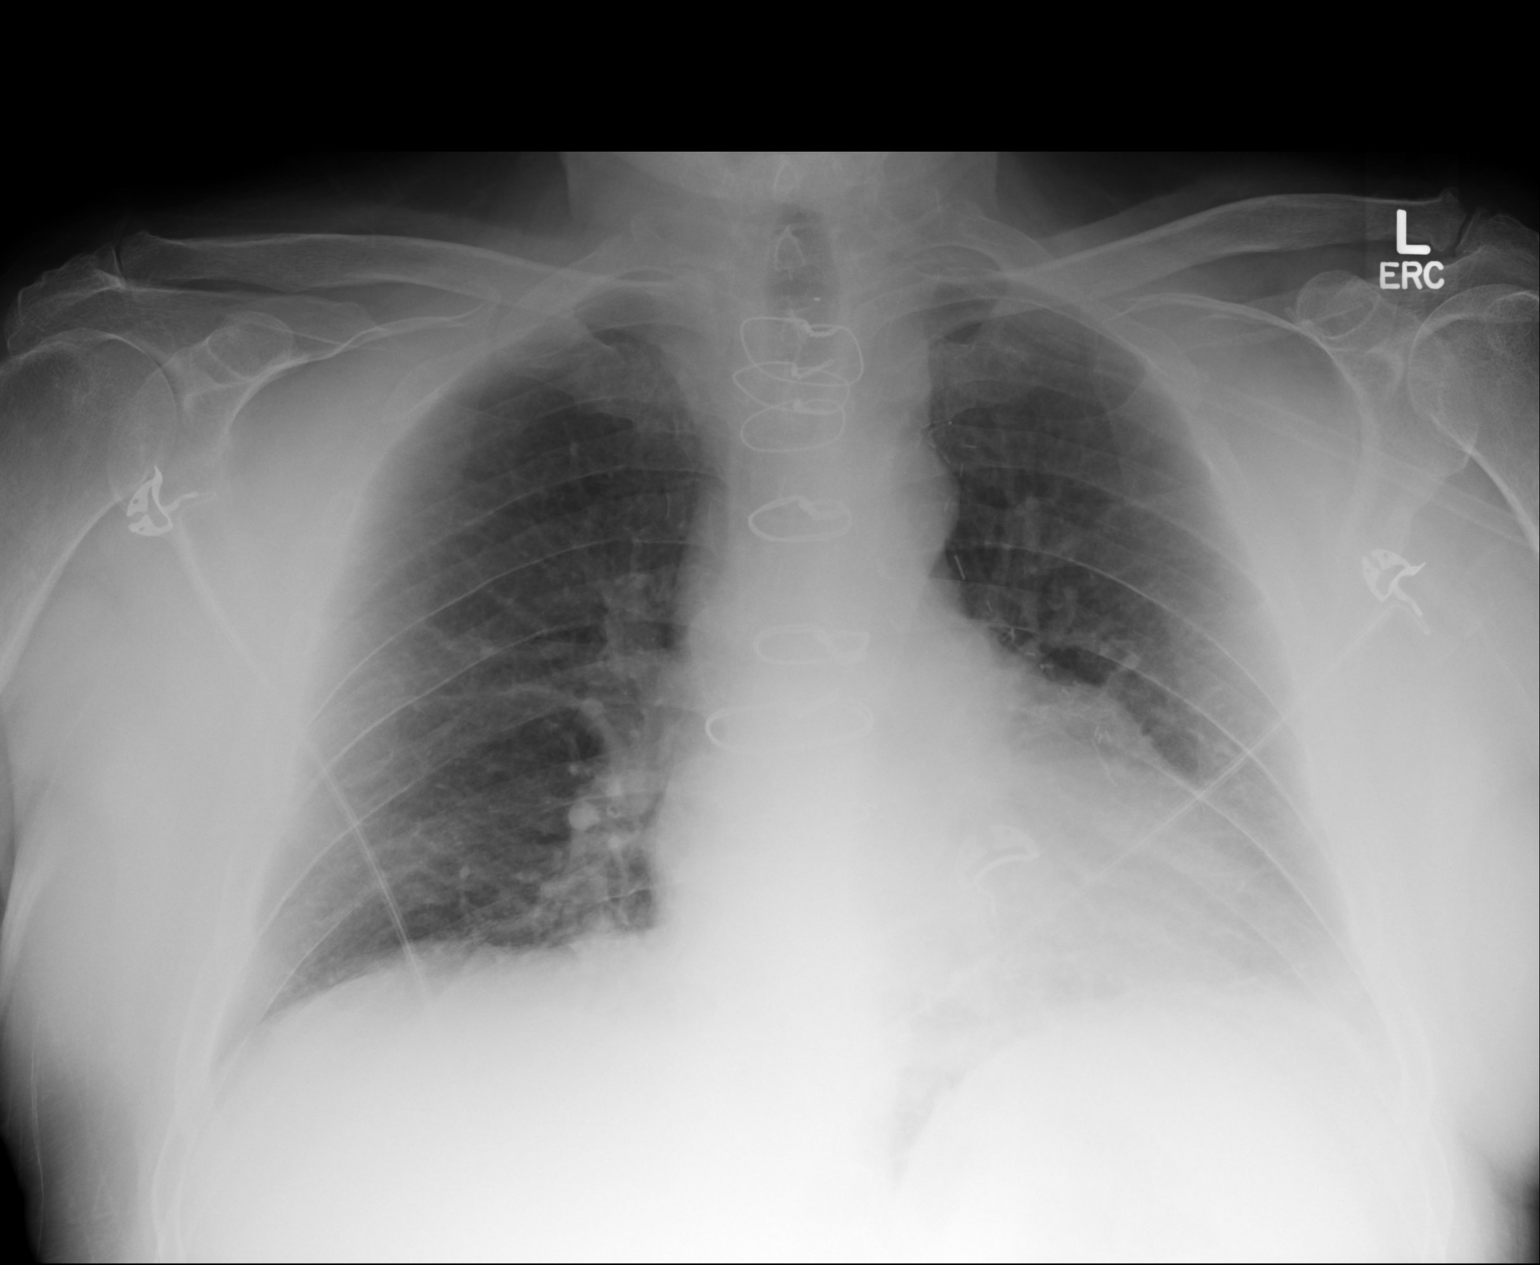

[1 of 1 positions shown; findings below may reference images not displayed]

FINDINGS: Postoperative changes in the mediastinum. Shallow inspiration. The
heart size and mediastinal contours are within normal limits. Both
lungs are clear. The visualized skeletal structures are
unremarkable.
IMPRESSION: No active disease.

## 2016-09-30 IMAGING — CT CT ABD-PELV W/ CM
2 of 5 series · 16 of 46 positions shown, 18 images · IV contrast (omnipaque)
Comparison: 07/03/2011

CLINICAL DATA: Epigastric pain radiating across the left upper
quadrant abdomen. Pain started [REDACTED] and is worsening. Esophageal
procedure down at [facility] 4 weeks ago.

EXAM:
CT ABDOMEN AND PELVIS WITH CONTRAST
TECHNIQUE: Multidetector CT imaging of the abdomen and pelvis was performed
using the standard protocol following bolus administration of
intravenous contrast.
CONTRAST:  100 mL Omnipaque 300

[Series 2: routine abd pel with · axial · 0.80mm/px · z∈[-496,-72]mm · 13 of 97 slices shown, 15 images]
[im 6/97  soft-tissue]
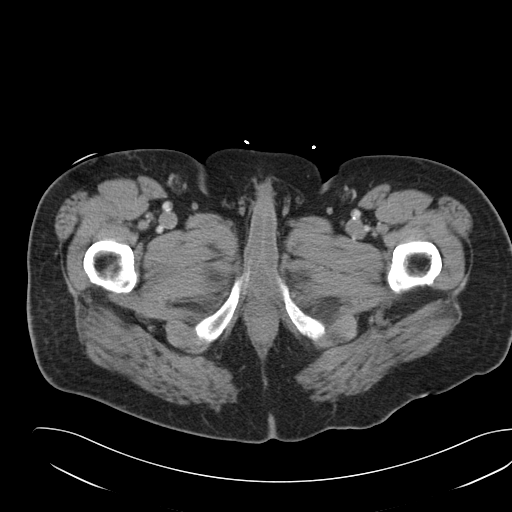
[im 6/97  bone]
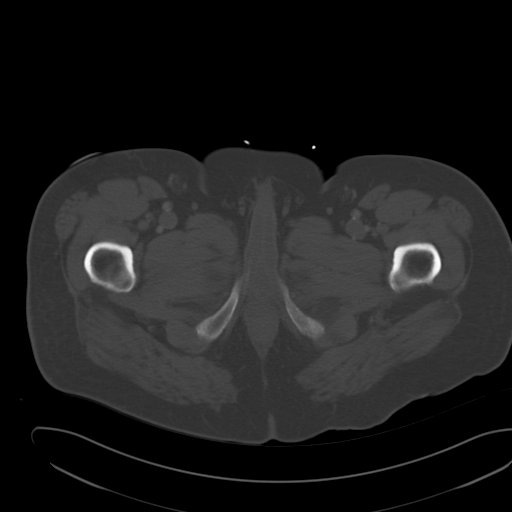
[im 11/97  soft-tissue]
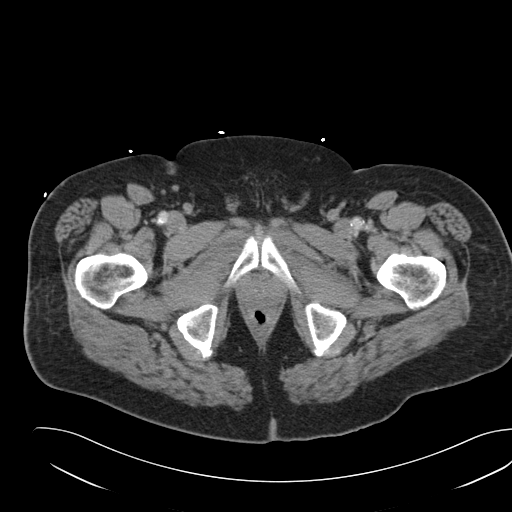
[im 22/97  soft-tissue]
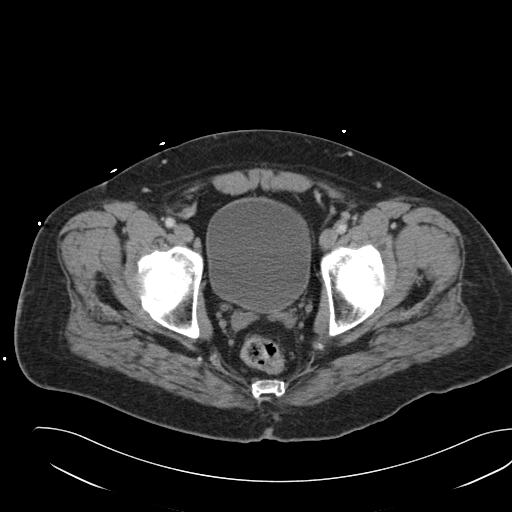
[im 27/97  soft-tissue]
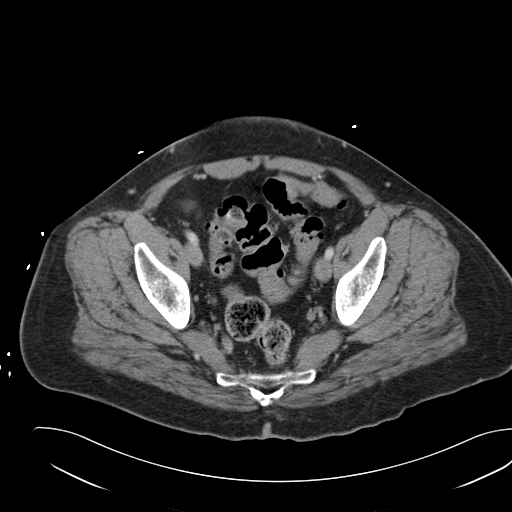
[im 33/97  soft-tissue]
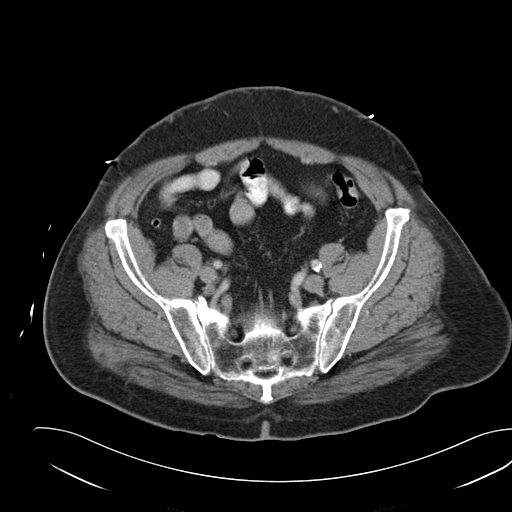
[im 43/97  soft-tissue]
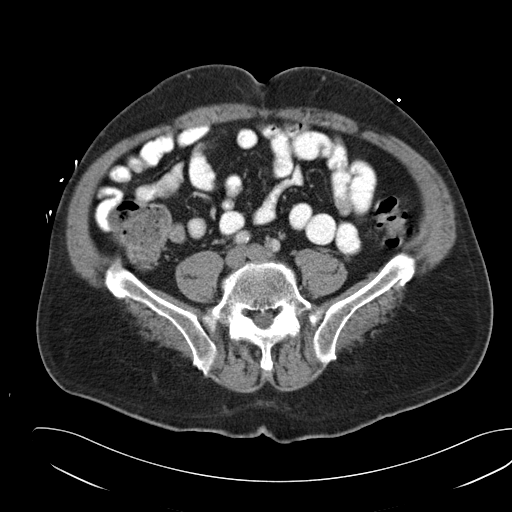
[im 49/97  soft-tissue]
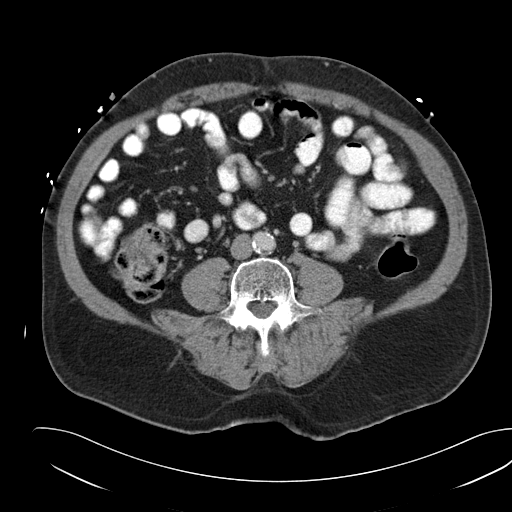
[im 54/97  soft-tissue]
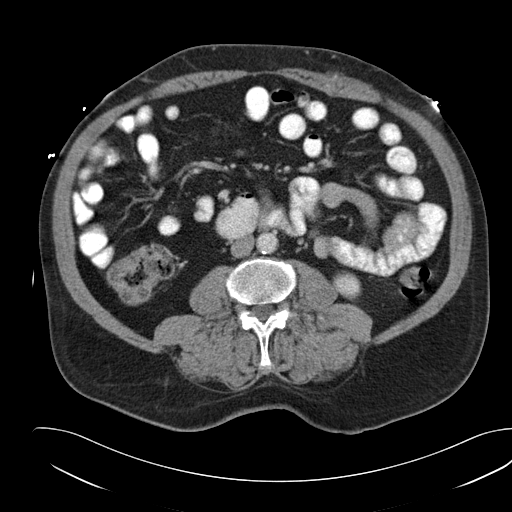
[im 65/97  soft-tissue]
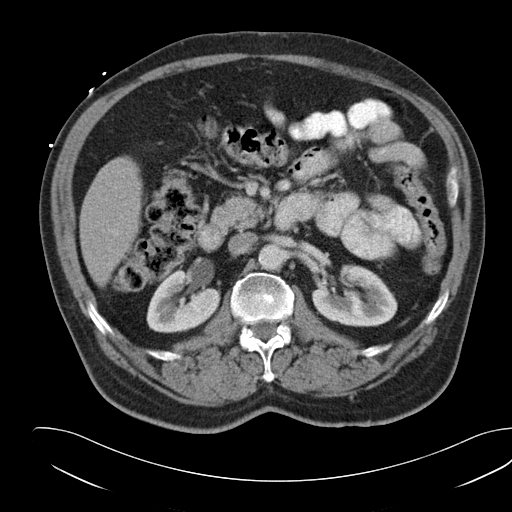
[im 65/97  bone]
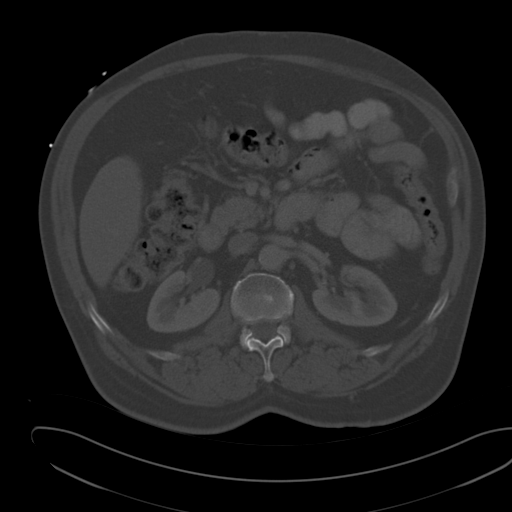
[im 70/97  soft-tissue]
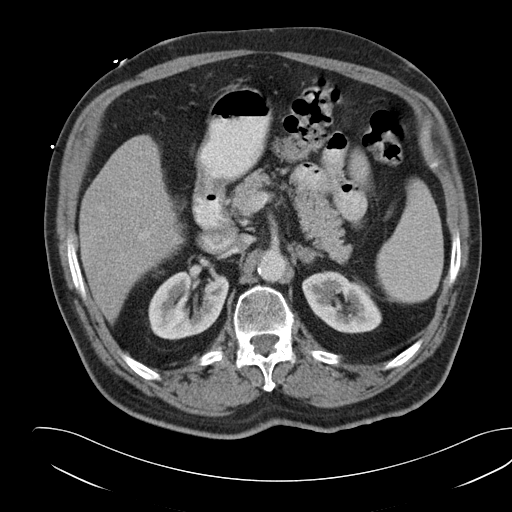
[im 75/97  soft-tissue]
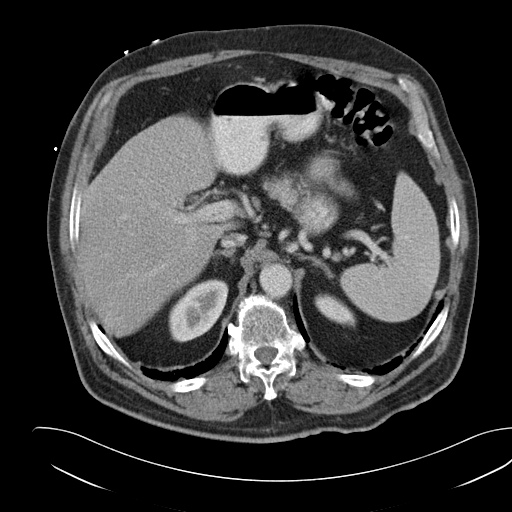
[im 86/97  soft-tissue]
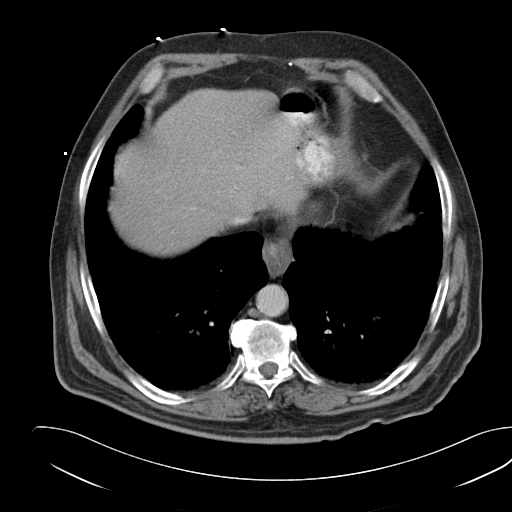
[im 91/97  soft-tissue]
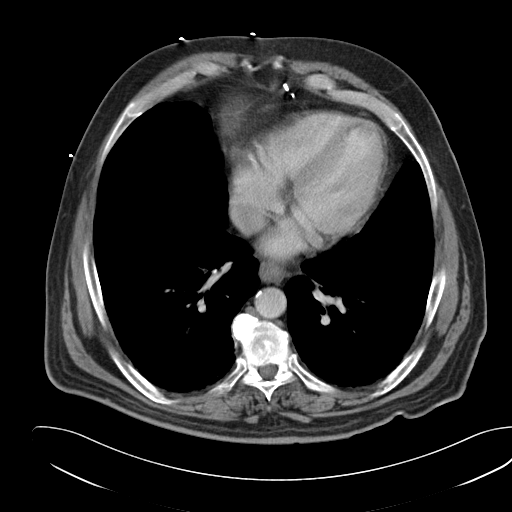

[Series 5: cor routine abd pel with · coronal · 0.77mm/px · 3 of 153 slices shown]
[im 51/153  soft-tissue]
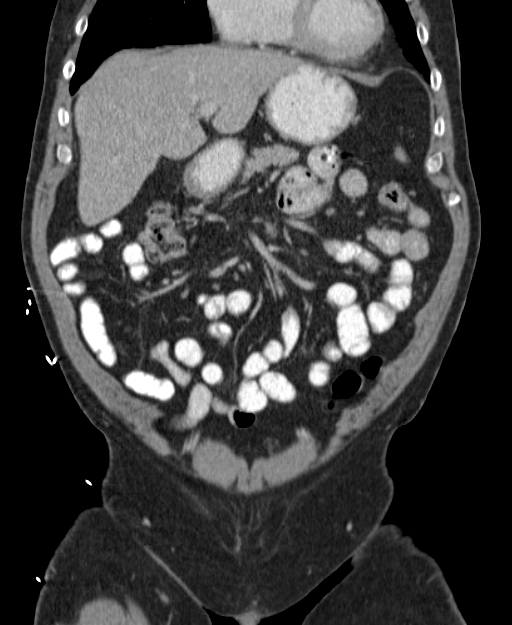
[im 68/153  soft-tissue]
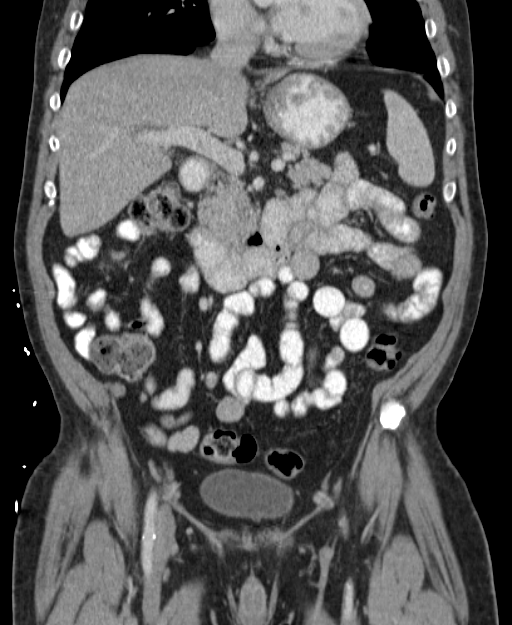
[im 85/153  soft-tissue]
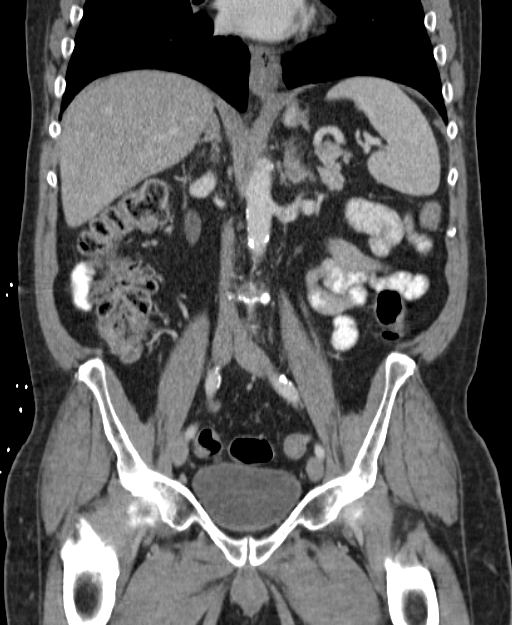

[16 of 46 positions shown; findings below may reference images not displayed]

FINDINGS: With atelectasis in the lung bases.

Surgical absence of the gallbladder. Liver, spleen, pancreas,
adrenal glands, kidneys, abdominal aorta, inferior vena cava, and
retroperitoneal lymph nodes are unremarkable. Thickening of the wall
at the gastroesophageal junction is nonspecific but may be related
to recent described procedure. Stomach is otherwise unremarkable.
Small bowel and colon appear grossly normal. No free air or free
fluid in the abdomen.

Pelvis: Appendix is normal. No bladder wall thickening. Prostate
gland is not enlarged. Fat in the left inguinal canal. Scattered
diverticula in the sigmoid colon without evidence of diverticulitis.
Mild degenerative changes in the lumbar spine. No destructive bone
lesions identified.
IMPRESSION: No acute process identified in the abdomen or pelvis that would
account for patient's symptoms. Mild wall thickening at the
gastroesophageal junction which may be related to recent procedure.

## 2016-10-03 ENCOUNTER — Emergency Department
Admission: EM | Admit: 2016-10-03 | Discharge: 2016-10-03 | Disposition: A | Discharge status: Home / Self Care | Payer: Commercial Managed Care - HMO | Attending: Emergency Medicine | Admitting: Emergency Medicine

## 2016-10-03 ENCOUNTER — Emergency Department: Payer: Commercial Managed Care - HMO

## 2016-10-03 ENCOUNTER — Encounter: Payer: Self-pay | Admitting: Emergency Medicine

## 2016-10-03 DIAGNOSIS — I130 Hypertensive heart and chronic kidney disease with heart failure and stage 1 through stage 4 chronic kidney disease, or unspecified chronic kidney disease: Secondary | ICD-10-CM | POA: Diagnosis not present

## 2016-10-03 DIAGNOSIS — J45909 Unspecified asthma, uncomplicated: Secondary | ICD-10-CM | POA: Insufficient documentation

## 2016-10-03 DIAGNOSIS — I5032 Chronic diastolic (congestive) heart failure: Secondary | ICD-10-CM | POA: Diagnosis not present

## 2016-10-03 DIAGNOSIS — Z79899 Other long term (current) drug therapy: Secondary | ICD-10-CM | POA: Insufficient documentation

## 2016-10-03 DIAGNOSIS — Z7982 Long term (current) use of aspirin: Secondary | ICD-10-CM | POA: Diagnosis not present

## 2016-10-03 DIAGNOSIS — I2510 Atherosclerotic heart disease of native coronary artery without angina pectoris: Secondary | ICD-10-CM | POA: Diagnosis not present

## 2016-10-03 DIAGNOSIS — R0602 Shortness of breath: Secondary | ICD-10-CM | POA: Diagnosis not present

## 2016-10-03 DIAGNOSIS — J449 Chronic obstructive pulmonary disease, unspecified: Secondary | ICD-10-CM | POA: Diagnosis not present

## 2016-10-03 DIAGNOSIS — R079 Chest pain, unspecified: Secondary | ICD-10-CM | POA: Diagnosis present

## 2016-10-03 DIAGNOSIS — R0789 Other chest pain: Secondary | ICD-10-CM | POA: Insufficient documentation

## 2016-10-03 DIAGNOSIS — Z7984 Long term (current) use of oral hypoglycemic drugs: Secondary | ICD-10-CM | POA: Insufficient documentation

## 2016-10-03 DIAGNOSIS — N189 Chronic kidney disease, unspecified: Secondary | ICD-10-CM | POA: Diagnosis not present

## 2016-10-03 DIAGNOSIS — Z87891 Personal history of nicotine dependence: Secondary | ICD-10-CM | POA: Insufficient documentation

## 2016-10-03 DIAGNOSIS — E1122 Type 2 diabetes mellitus with diabetic chronic kidney disease: Secondary | ICD-10-CM | POA: Diagnosis not present

## 2016-10-03 LAB — BASIC METABOLIC PANEL
Anion gap: 9 (ref 5–15)
BUN: 20 mg/dL (ref 6–20)
CO2: 33 mmol/L — ABNORMAL HIGH (ref 22–32)
Calcium: 9.2 mg/dL (ref 8.9–10.3)
Chloride: 96 mmol/L — ABNORMAL LOW (ref 101–111)
Creatinine, Ser: 1.39 mg/dL — ABNORMAL HIGH (ref 0.61–1.24)
GFR calc Af Amer: >60 mL/min (ref >60)
GFR calc non Af Amer: 53 mL/min — ABNORMAL LOW (ref >60)
Glucose, Bld: 208 mg/dL — ABNORMAL HIGH (ref 65–99)
Potassium: 3.1 mmol/L — ABNORMAL LOW (ref 3.5–5.1)
Sodium: 138 mmol/L (ref 135–145)

## 2016-10-03 LAB — TROPONIN I
Troponin I: <0.03 ng/mL (ref <0.03)
Troponin I: <0.03 ng/mL (ref <0.03)

## 2016-10-03 LAB — CBC
HCT: 47.1 % (ref 40.0–52.0)
Hemoglobin: 15.8 g/dL (ref 13.0–18.0)
MCH: 28.2 pg (ref 26.0–34.0)
MCHC: 33.5 g/dL (ref 32.0–36.0)
MCV: 84.1 fL (ref 80.0–100.0)
Platelets: 218 10*3/uL (ref 150–440)
RBC: 5.6 MIL/uL (ref 4.40–5.90)
RDW: 19.1 % — ABNORMAL HIGH (ref 11.5–14.5)
WBC: 9.6 10*3/uL (ref 3.8–10.6)

## 2016-10-03 MED ORDER — GI COCKTAIL ~~LOC~~
30.0000 mL | Freq: Once | ORAL | Status: AC
  Administered 2016-10-03: 30 mL via ORAL
  Filled 2016-10-03: qty 30

## 2016-10-03 MED ORDER — METOCLOPRAMIDE HCL 10 MG PO TABS
ORAL_TABLET | ORAL | Status: AC
  Administered 2016-10-03: 10 mg via ORAL
  Filled 2016-10-03: qty 1

## 2016-10-03 MED ORDER — METOCLOPRAMIDE HCL 10 MG PO TABS
10.0000 mg | ORAL_TABLET | Freq: Once | ORAL | Status: AC
  Administered 2016-10-03: 10 mg via ORAL

## 2016-10-03 NOTE — ED Notes (Signed)
This nurse went over DC instructions, pt verbalized understanding with teach back as well as follow up with calling his PCP and cardiologist [name], per pt statement). Pt states he will call them as well as his lung doctor, Dr. Chancy Milroy. Pt states he does not have any questions at this time.

## 2016-10-03 NOTE — ED Notes (Addendum)
Pt had recent Echo done 2 weeks ago.  Appt with Dr. Chancy Milroy on Tuesday scheduled.  Pt placed on hospital Oxygen tank . Hx of COPD.

## 2016-10-03 NOTE — ED Notes (Signed)
Patient transported to X-ray 

## 2016-10-03 NOTE — ED Triage Notes (Signed)
Pt reports chest pain that started last night, increasing pain since then along with shortness of breath at rest and exertion. 2L [place] daily continuous. Pt reports feeling a flutter in his chest pain, pt states he has hx of atrial fibrillation and is on eliquis and baby aspirin.

## 2016-10-03 NOTE — ED Provider Notes (Signed)
Assumed care from Dr. Archie Balboa awaiting second troponin. This test is come back it is negative again. Vital signs remained normal. Patient feeling a little better. He is on maximal medical therapy, takes Eliquis and aspirin. Pattern of symptoms today is not consistent with anginal pain or ACS. Low suspicion of AAA dissection pericarditis pneumothorax or PE. Has an appointment with cardiology in 3 days. We'll discharge home.   Carrie Mew, MD 10/03/16 2216

## 2016-10-03 NOTE — ED Provider Notes (Signed)
[facility] Emergency Department Provider Note  ____________________________________________   I have reviewed the triage vital signs and the nursing notes.   HISTORY  Chief Complaint Chest Pain   History limited by: Not Limited   HPI Lawrence [name] is a 60 y.o. male who presents to the emergency department today because of concern for chest pain. It is located centrally. The patient states that he has a history of CAD s/p CABG and stent placement. He does have frequent visits to the emergency department and had an admission last month for chest pain, with negative rule out. Had an echocardiogram done in September with normal left ventricular function. Denies any fevers.   Past Medical History:  Diagnosis Date  . A-fib ([place])   . Anemia   . Anxiety   . Asthma   . CAD (coronary artery disease)    a. 2002 CABGx2 (LIMA->LAD, VG->VG->OM1);  b. 09/2012 DES->OM;  c. 03/2015 PTCA of LAD [facility]) in setting of atretic LIMA; d. 05/2015 Cath [facility]): nonobs dzs; e. 06/2015 Cath ([facility]): LM nl, LAD 45p/d ISR, 50d, D1/2 small, LCX 50p/d ISR, OM1 70ost, 30 ISR, VG->OM1 50ost, 70m LIMA->LAD 99p/d - atretic, RCA dom, nl; f.cath 10/16: 40-50%(FFR 0.90) pLAD, 75% (FFR 0.77) mLAD s/p PCI/DES, oRCA 40% (FFR0.95)  . Celiac disease   . Chronic diastolic CHF (congestive heart failure) ([place]    a. 06/2009 Echo: EF 60-65%, Gr 1 DD, triv AI, mildly dil LA, nl RV.  .Marland KitchenCOPD (chronic obstructive pulmonary disease) ([place]    a. Chronic bronchitis and emphysema.  .Marland KitchenDysrhythmia   . Essential hypertension   . History of tobacco abuse    a. Quit 2014.  .Marland KitchenPSVT (paroxysmal supraventricular tachycardia) ([place]    a. 10/2012 Noted on Zio Patch.  . Type II diabetes mellitus ([facility]     Patient Active Problem List   Diagnosis Date Noted  . Dyspnea 04/03/2016  . Hypotension 04/03/2016  . Chronic kidney disease 04/03/2016  . Anemia 04/03/2016  . Paroxysmal atrial fibrillation ([place] 12/23/2015  . OSA  (obstructive sleep apnea) 12/10/2015  . Left inguinal hernia 11/07/2015  . Anxiety 11/07/2015  . Unstable angina ([place] 10/05/2015  . PAF (paroxysmal atrial fibrillation) ([place] 10/05/2015  . Back pain with left-sided radiculopathy 09/30/2015  . Nocturnal hypoxia 09/06/2015  . BPH (benign prostatic hyperplasia) 08/01/2015  . Chronic diastolic CHF (congestive heart failure) ([place]   . Angina pectoris ([place]   . Chest pain 07/11/2015  . COPD (chronic obstructive pulmonary disease) ([place] 07/03/2015  . CAD (coronary artery disease) 06/26/2015  . HTN (hypertension) 06/26/2015  . DM (diabetes mellitus) ([place] 06/26/2015  . Achalasia 07/24/2014  . GERD (gastroesophageal reflux disease) 06/07/2014  . Former tobacco use 04/11/2013  . HLD (hyperlipidemia) 04/09/2013    Past Surgical History:  Procedure Laterality Date  . BYPASS GRAFT    . CARDIAC CATHETERIZATION N/A 07/12/2015   rocedure: Left Heart Cath and Cors/Grafts Angiography;  Surgeon: HBelva Crome MD;  [facility];  Service: Cardiovascular;  Laterality: N/A;  . CARDIAC CATHETERIZATION Right 10/07/2015   Procedure: Left Heart Cath and Cors/Grafts Angiography;  Surgeon: SDionisio David MD;  [place];  Service: Cardiovascular;  Laterality: Right;  . CARDIAC CATHETERIZATION N/A 04/06/2016   Procedure: Left Heart Cath and Coronary Angiography;  Surgeon: DYolonda Kida MD;  [place];  Service: Cardiovascular;  Laterality: N/A;  . CARDIAC CATHETERIZATION  04/06/2016   Procedure: Bypass Graft  Angiography;  Surgeon: Yolonda Kida, MD;  Location: [place] [facility] LAB;  Service: Cardiovascular;;  . CHOLECYSTECTOMY    . ESOPHAGEAL DILATION    . TONSILLECTOMY    . VASCULAR SURGERY      Prior to Admission medications   Medication Sig Start Date End Date Taking? Authorizing Provider  albuterol (PROVENTIL HFA;VENTOLIN HFA) 108 (90 Base) MCG/ACT inhaler Inhale 4-6 puffs into the  lungs every 4 (four) hours as needed for wheezing or shortness of breath. Reported on 04/08/2016    Historical Provider, MD  allopurinol (ZYLOPRIM) 100 MG tablet THREE (3) TABLETS TWICE DAILY 04/25/16   Birdie Sons, MD  ALPRAZolam Duanne Moron) 1 MG tablet Take 0.5-1 tablets (0.5-1 mg total) by mouth 3 (three) times daily as needed for anxiety. 05/26/16   Birdie Sons, MD  amLODipine (NORVASC) 10 MG tablet Take 10 mg by mouth daily.    Historical Provider, MD  aspirin 81 MG EC tablet Take 81 mg by mouth daily. Swallow whole.    Historical Provider, MD  atorvastatin (LIPITOR) 80 MG tablet TAKE 1 TABLET BY MOUTH EVERY NIGHT AT BEDTIME. 05/26/16   Birdie Sons, MD  budesonide-formoterol [facility]) 160-4.5 MCG/ACT inhaler Inhale 2 puffs into the lungs 2 (two) times daily.    Historical Provider, MD  cetirizine (ZYRTEC) 10 MG tablet TAKE ONE TABLET AT BEDTIME 04/19/16   Birdie Sons, MD  cyclobenzaprine (FLEXERIL) 10 MG tablet TAKE 1 TABLET BY MOUTH EVERY 8 HOURS AS NEEEDED FOR MUSCLE SPASM 07/25/16   Birdie Sons, MD  ELIQUIS 5 MG TABS tablet TAKE ONE TABLET TWICE DAILY 07/25/16   Birdie Sons, MD  ferrous sulfate 325 (65 FE) MG tablet Take 325 mg by mouth daily with breakfast. 08/03/16 08/03/17  Historical Provider, MD  fluticasone furoate-vilanterol (BREO ELLIPTA) 100-25 MCG/INH AEPB Inhale 1 puff into the lungs daily.     Historical Provider, MD  glucose blood (ACCU-CHEK AVIVA PLUS) test strip Use to check blood sugar once a day 08/21/16   Birdie Sons, MD  lansoprazole (PREVACID) 30 MG capsule Take 30 mg by mouth 2 (two) times daily.     Historical Provider, MD  magnesium oxide (MAG-OX) 400 (241.3 Mg) MG tablet Take 400 mg by mouth daily.    Historical Provider, MD  metFORMIN (GLUCOPHAGE) 500 MG tablet Take by mouth daily. Pt takes one daily    Historical Provider, MD  metoprolol tartrate (LOPRESSOR) 25 MG tablet Take 0.5 tablets (12.5 mg total) by mouth 2 (two) times daily. Patient not taking:  Reported on 09/08/2016 06/26/16   Epifanio Lesches, MD  nitroGLYCERIN (NITROSTAT) 0.4 MG SL tablet Place 0.4 mg under the tongue every 5 (five) minutes as needed for chest pain. Reported on 05/26/2016    Historical Provider, MD  omega-3 acid ethyl esters (LOVAZA) 1 g capsule TAKE FOUR CAPSULES BY MOUTH DAILY 07/25/16   Birdie Sons, MD  oxyCODONE-acetaminophen (PERCOCET) 10-325 MG tablet Take 1 tablet by mouth every 4 (four) hours as needed. 09/11/16   Birdie Sons, MD  RANEXA 500 MG 12 hr tablet TAKE TWO (2) TABLETS TWICE A DAY 07/25/16   Birdie Sons, MD  senna (SENOKOT) 8.6 MG TABS tablet Take 2 tablets by mouth daily.    Historical Provider, MD  sotalol (BETAPACE) 120 MG tablet Take 120 mg by mouth daily.    Historical Provider, MD  sucralfate (CARAFATE) 1 g tablet Take 1 tablet (1 g total) by mouth 2 (two) times daily.  Patient not taking: Reported on 09/08/2016 04/22/16   [name], MD  tamsulosin (FLOMAX) 0.4 MG CAPS capsule Take 0.4 mg by mouth daily after breakfast.     Historical Provider, MD  tiotropium (SPIRIVA) 18 MCG inhalation capsule Place 18 mcg into inhaler and inhale daily.    Historical Provider, MD  torsemide (DEMADEX) 100 MG tablet Take 100 mg by mouth daily.    Historical Provider, MD    Allergies Demerol [meperidine]; Prednisone; Sulfa antibiotics; Albuterol sulfate [albuterol]; and Morphine sulfate  Family History  Problem Relation Age of Onset  . Heart attack Mother   . Depression Mother   . Heart disease Mother   . COPD Mother   . Hypertension Mother   . Heart attack Father   . Diabetes Father   . Depression Father   . Heart disease Father   . Cirrhosis Father   . Parkinson's disease Brother     Social History Social History  Substance Use Topics  . Smoking status: Former Smoker    Quit date: 04/22/2013  . Smokeless tobacco: Never Used  . Alcohol use No     Comment: remotely quit alcohol use. Hx of heavy alcohol use.    Review of  Systems  Constitutional: Negative for fever. Cardiovascular: Negative for chest pain. Respiratory: Negative for shortness of breath. Gastrointestinal: Negative for abdominal pain, vomiting and diarrhea. Genitourinary: Negative for dysuria. Musculoskeletal: Negative for back pain. Skin: Negative for rash. Neurological: Negative for headaches, focal weakness or numbness.  10-point ROS otherwise negative.  ____________________________________________   PHYSICAL EXAM:  VITAL SIGNS: ED Triage Vitals  Enc Vitals Group     BP 10/03/16 1820 137/72     Pulse Rate 10/03/16 1820 74     Resp 10/03/16 1820 (!) 22     Temp 10/03/16 1820 97.7 F (36.5 C)     Temp Source 10/03/16 1820 Oral     SpO2 10/03/16 1820 92 %     Weight 10/03/16 1813 243 lb (110.2 kg)     Height 10/03/16 1813 5' 7"  (1.702 m)     Head Circumference --      Peak Flow --      Pain Score 10/03/16 1909 8   Constitutional: Alert and oriented. Well appearing and in no distress. Eyes: Conjunctivae are normal. Normal extraocular movements. ENT   Head: Normocephalic and atraumatic.   Nose: No congestion/rhinnorhea.   Mouth/Throat: Mucous membranes are moist.   Neck: No stridor. Hematological/Lymphatic/Immunilogical: No cervical lymphadenopathy. Cardiovascular: Normal rate, regular rhythm.  No murmurs, rubs, or gallops. Respiratory: Normal respiratory effort without tachypnea nor retractions. Breath sounds are clear and equal bilaterally. No wheezes/rales/rhonchi. Gastrointestinal: Soft and nontender. No distention.  Genitourinary: Deferred Musculoskeletal: Normal range of motion in all extremities. No lower extremity edema. Neurologic:  Normal speech and language. No gross focal neurologic deficits are appreciated.  Skin:  Skin is warm, dry and intact. No rash noted. Psychiatric: Mood and affect are normal. Speech and behavior are normal. Patient exhibits appropriate insight and  judgment.  ____________________________________________    LABS (pertinent positives/negatives)  Labs Reviewed  BASIC METABOLIC PANEL - Abnormal; Notable for the following:       Result Value   Potassium 3.1 (*)    Chloride 96 (*)    CO2 33 (*)    Glucose, Bld 208 (*)    Creatinine, Ser 1.39 (*)    GFR calc non Af Amer 53 (*)    All other components within normal limits  CBC - Abnormal; Notable for the following:    RDW 19.1 (*)    All other components within normal limits  TROPONIN I  TROPONIN I   2nd troponin pending at time of signout  ____________________________________________   EKG  I, Nance Pear, attending physician, personally viewed and interpreted this EKG  EKG Time: 1812 Rate: 69 Rhythm: normal sinus rhythm Axis: normal Intervals: qtc 473 QRS: narrow ST changes: no st elevation Impression: normal ekg  ____________________________________________    RADIOLOGY  CXR IMPRESSION:  Left basilar atelectasis. No other acute abnormalities.   ____________________________________________   PROCEDURES  Procedures  ____________________________________________   INITIAL IMPRESSION / ASSESSMENT AND PLAN / ED COURSE  Pertinent labs & imaging results that were available during my care of the patient were reviewed by me and considered in my medical decision making (see chart for details).  Patient here with chest pain, history of CAD. Patient with multiple ED visits for similar complaint, recent admission last month and was seen by cardiology. EKG and CXR without concerning finding. Additionally initial troponin negative. Will try GI cocktail given previous thoughts that gastritis was playing a role in the patients pain. Will however order second troponin.   ____________________________________________   FINAL CLINICAL IMPRESSION(S) / ED DIAGNOSES  Chest pain   Note: This dictation was prepared with Dragon dictation. Any transcriptional  errors that result from this process are unintentional    Nance Pear, MD 10/04/16 1151

## 2016-10-05 ENCOUNTER — Other Ambulatory Visit: Payer: Self-pay | Admitting: Family Medicine

## 2016-10-05 ENCOUNTER — Telehealth: Payer: Self-pay | Admitting: Family Medicine

## 2016-10-05 MED ORDER — NITROGLYCERIN 0.4 MG SL SUBL: 0.4000 mg | SUBLINGUAL_TABLET | SUBLINGUAL | 1 refills | Status: DC | PRN

## 2016-10-05 NOTE — Telephone Encounter (Signed)
Pt contacted office for refill request on the following medications: nitroGLYCERIN (NITROSTAT) 0.4 MG SL tablet  To [place] like the last time Dr. [name] sent this Rx was 05/10/12 Demetrius Charity) Last OV: 08/18/16 Next scheduled OV: 10/27/16 Please advise. Thanks TNP

## 2016-10-05 NOTE — Telephone Encounter (Signed)
Please review. KW 

## 2016-10-06 DIAGNOSIS — G4733 Obstructive sleep apnea (adult) (pediatric): Secondary | ICD-10-CM | POA: Diagnosis not present

## 2016-10-06 DIAGNOSIS — R0602 Shortness of breath: Secondary | ICD-10-CM | POA: Diagnosis not present

## 2016-10-06 DIAGNOSIS — J9611 Chronic respiratory failure with hypoxia: Secondary | ICD-10-CM | POA: Diagnosis not present

## 2016-10-06 DIAGNOSIS — F17211 Nicotine dependence, cigarettes, in remission: Secondary | ICD-10-CM | POA: Diagnosis not present

## 2016-10-06 MED ORDER — NITROGLYCERIN 0.4 MG SL SUBL: 0.4000 mg | SUBLINGUAL_TABLET | SUBLINGUAL | 1 refills | Status: DC | PRN

## 2016-10-06 NOTE — Addendum Note (Signed)
Addended by: Birdie Sons on: 10/06/2016 08:39 AM   Modules accepted: Orders

## 2016-10-07 ENCOUNTER — Other Ambulatory Visit: Payer: Self-pay | Admitting: Family Medicine

## 2016-10-07 ENCOUNTER — Other Ambulatory Visit: Payer: Self-pay | Admitting: *Deleted

## 2016-10-07 MED ORDER — OXYCODONE-ACETAMINOPHEN 10-325 MG PO TABS: 1.0000 | ORAL_TABLET | ORAL | 0 refills | Status: DC | PRN

## 2016-10-07 NOTE — Patient Outreach (Signed)
[facility]) Care Management   10/07/2016  Lawrence [name] October 19, 1956 [id number]  Lawrence [name] is an 60 y.o. male  Subjective:  Pt reports on recent ED visit (11/18) for chest pain, was told to follow up with heart MD Dr. Humphrey [name] but sees Dr. Nehemiah [name].   Pt reports he did see Dr. Humphrey [name] the Lung MD yesterday, Echo was Done (normal).   Pt reports he is to follow up with Lung MD 12/20 for lung test, was told previous  Lung test done 10/16- 70% lung function.  Pt reports MD wants him to avoid second hand smoke  From son.   Pt reports he did not follow up with Dr. Nehemiah [name] because  He saw MD last month, was  told they did all they could do, on the best medications he could be on.  Pt reports results of stool sample were negative, told to call if symptoms reoccur.   Pt reports to  See Dr. Caryn [name] 12/12, going to ask him about getting a scale.  Pt reports weight yesterday at MD Office was 240 lbs, lost 3 lbs, abdomen not as distended.  Pt reports he is able to  go to [facility] to check weight and BP.  Pt reports sugars have been 121,125, lowest 98 to highest  179.  Pt reports continues to stay in motel with spouse/family, remains on the [employer], getting lower on the list.    Objective:   Vitals:   10/07/16 1334  BP: 122/70  Pulse: 70  Resp: 20    ROS  Physical Exam  Constitutional: He is oriented to person, place, and time. He appears well-developed and well-nourished.  Cardiovascular: Normal rate, regular rhythm and normal heart sounds.   Respiratory: Effort normal and breath sounds normal.  GI: Bowel sounds are normal. He exhibits distension.  Musculoskeletal: Normal range of motion. He exhibits no edema.  Neurological: He is alert and oriented to person, place, and time.  Skin: Skin is warm and dry.  Psychiatric: He has a normal mood and affect. His behavior is normal. Judgment and thought content normal.    Encounter Medications:    Outpatient Encounter Prescriptions as of 10/07/2016  Medication Sig Note  . albuterol (PROVENTIL HFA;VENTOLIN HFA) 108 (90 Base) MCG/ACT inhaler Inhale 4-6 puffs into the lungs every 4 (four) hours as needed for wheezing or shortness of breath. Reported on 04/08/2016 09/08/2016: As needed.   Marland Kitchen allopurinol (ZYLOPRIM) 100 MG tablet THREE (3) TABLETS TWICE DAILY   . ALPRAZolam (XANAX) 1 MG tablet Take 0.5-1 tablets (0.5-1 mg total) by mouth 3 (three) times daily as needed for anxiety.   Marland Kitchen amLODipine (NORVASC) 10 MG tablet Take 10 mg by mouth daily.   Marland Kitchen aspirin 81 MG EC tablet Take 81 mg by mouth daily. Swallow whole.   Marland Kitchen atorvastatin (LIPITOR) 80 MG tablet TAKE 1 TABLET BY MOUTH EVERY NIGHT AT BEDTIME.   . cetirizine (ZYRTEC) 10 MG tablet TAKE ONE TABLET AT BEDTIME   . cyclobenzaprine (FLEXERIL) 10 MG tablet TAKE 1 TABLET BY MOUTH EVERY 8 HOURS AS NEEEDED FOR MUSCLE SPASM 09/08/2016: As needed.   Marland Kitchen ELIQUIS 5 MG TABS tablet TAKE ONE TABLET TWICE DAILY   . ferrous sulfate 325 (65 FE) MG tablet Take 325 mg by mouth daily with breakfast.   . fluticasone furoate-vilanterol (BREO ELLIPTA) 100-25 MCG/INH AEPB Inhale 1 puff into the lungs daily.  09/08/2016: Pt to take once finished with Symbicort   .  glucose blood (ACCU-CHEK AVIVA PLUS) test strip Use to check blood sugar once a day   . lansoprazole (PREVACID) 30 MG capsule Take 30 mg by mouth 2 (two) times daily.    . magnesium oxide (MAG-OX) 400 (241.3 Mg) MG tablet Take 400 mg by mouth daily.   . metFORMIN (GLUCOPHAGE) 500 MG tablet Take by mouth daily. Pt takes one daily   . metoprolol tartrate (LOPRESSOR) 25 MG tablet Take 0.5 tablets (12.5 mg total) by mouth 2 (two) times daily. 10/07/2016: Pt taking differently 25 mg once a day   . nitroGLYCERIN (NITROSTAT) 0.4 MG SL tablet Place 1 tablet (0.4 mg total) under the tongue every 5 (five) minutes as needed for chest pain. Reported on 05/26/2016   . omega-3 acid ethyl esters (LOVAZA) 1 g capsule TAKE FOUR  CAPSULES BY MOUTH DAILY   . oxyCODONE-acetaminophen (PERCOCET) 10-325 MG tablet Take 1 tablet by mouth every 4 (four) hours as needed.   Marland Kitchen RANEXA 500 MG 12 hr tablet TAKE TWO (2) TABLETS TWICE A DAY   . senna (SENOKOT) 8.6 MG TABS tablet Take 2 tablets by mouth daily.   . sotalol (BETAPACE) 120 MG tablet Take 120 mg by mouth daily. 09/08/2016: Pt taking twice a day   . tamsulosin (FLOMAX) 0.4 MG CAPS capsule Take 0.4 mg by mouth daily after breakfast.    . tiotropium (SPIRIVA) 18 MCG inhalation capsule Place 18 mcg into inhaler and inhale daily.   Marland Kitchen torsemide (DEMADEX) 100 MG tablet Take 100 mg by mouth daily.   . budesonide-formoterol (SYMBICORT) 160-4.5 MCG/ACT inhaler Inhale 2 puffs into the lungs 2 (two) times daily.   . fluticasone furoate-vilanterol (BREO ELLIPTA) 100-25 MCG/INH AEPB Inhale 1 puff into the lungs daily. 10/07/2016: Repeated.   . metFORMIN (GLUCOPHAGE) 500 MG tablet TAKE 1 TABLET BY MOUTH EVERY MORNING. (Patient not taking: Reported on 10/07/2016) 10/07/2016: Repeat   . sucralfate (CARAFATE) 1 g tablet Take 1 tablet (1 g total) by mouth 2 (two) times daily. (Patient not taking: Reported on 10/07/2016)    No facility-administered encounter medications on file as of 10/07/2016.     Functional Status:   In your present state of health, do you have any difficulty performing the following activities: 09/03/2016 07/08/2016  Hearing? N -  Vision? N -  Difficulty concentrating or making decisions? N -  Walking or climbing stairs? Y -  Dressing or bathing? N -  Doing errands, shopping? Y -  Conservation officer, nature and eating ? - N  Using the Toilet? - N  Managing your Medications? - N  Managing your Finances? - N  Some recent data might be hidden    Fall/Depression Screening:    PHQ 2/9 Scores 04/16/2016 09/06/2015  PHQ - 2 Score 0 0  PHQ- 9 Score - 2    Assessment:  Pleasant 60 year old gentleman, resides in motel with spouse/family, waiting for                          Low  income housing to open up- remains on list.           HF-  Lungs clear, on continuous O 2 2 liters [place], saturation at rest 97%. No c/o sob.                            Pt c/o chronic chest pain (+6 today).  No edema noted in lower  extremities.                                            Plan:  Plan to continue to provide pt with community nurse case management services, follow up               Again next month- home visit.             Pt to f/u with Dr. Caryn [name] 12/12, to ask MD about getting a scale.              As discussed, pt to ask son/son's girlfriend to smoke outdoor of motel room, thus avoid                Second hand smoke.             Pt to have spouse pick up prescription for Nitroglycerin from MD office, pt to take as needed.   [facility] CM Care Plan Problem One   Flowsheet Row Most Recent Value  Care Plan Problem One  Better management of HF   Role Documenting the Problem One  Care Management Coordinator  Care Plan for Problem One  Active  [facility] Long Term Goal (31-90 days)  reestablished- pt's management of HF would improve within the next 60 days   [facility] Long Term Goal Start Date  09/09/16  Interventions for Problem One Long Term Goal  Discussed with pt continue to go to [facility] aide to weight/check BP + encouraged pt to ask PCP for scale.   [facility] CM Short Term Goal #4 (0-30 days)  Pt would pick up scale , start weighing in the next 30 days   [facility] CM Short Term Goal #4 Start Date  09/15/16    [facility] CM Care Plan Problem Two   Flowsheet Row Most Recent Value  THN CM Short Term Goal #1 (0-30 days)  pt will see a decrease in abdominal distention within the next 25 days   [facility] CM Short Term Goal #1 Start Date  09/15/16  [facility] CM Short Term Goal #1 Met Date   10/07/16     Zara Chess.   [place] Care Management  [phone]

## 2016-10-07 NOTE — Telephone Encounter (Signed)
Pt needs refill on his   oxyCODONE-acetaminophen (PERCOCET) 10-325 MG tablet   09/11/16 -- Birdie Sons, MD    Take 1 tablet by mouth every 4 (four) hours as needed.     Thanks, [employer]

## 2016-10-10 DIAGNOSIS — G4733 Obstructive sleep apnea (adult) (pediatric): Secondary | ICD-10-CM | POA: Diagnosis not present

## 2016-10-12 DIAGNOSIS — J449 Chronic obstructive pulmonary disease, unspecified: Secondary | ICD-10-CM | POA: Diagnosis not present

## 2016-10-12 DIAGNOSIS — G4710 Hypersomnia, unspecified: Secondary | ICD-10-CM | POA: Diagnosis not present

## 2016-10-12 DIAGNOSIS — J439 Emphysema, unspecified: Secondary | ICD-10-CM | POA: Diagnosis not present

## 2016-10-12 DIAGNOSIS — I10 Essential (primary) hypertension: Secondary | ICD-10-CM | POA: Diagnosis not present

## 2016-10-14 DIAGNOSIS — I480 Paroxysmal atrial fibrillation: Secondary | ICD-10-CM | POA: Diagnosis not present

## 2016-10-14 DIAGNOSIS — I10 Essential (primary) hypertension: Secondary | ICD-10-CM | POA: Diagnosis not present

## 2016-10-14 DIAGNOSIS — I25118 Atherosclerotic heart disease of native coronary artery with other forms of angina pectoris: Secondary | ICD-10-CM | POA: Diagnosis not present

## 2016-10-14 DIAGNOSIS — E782 Mixed hyperlipidemia: Secondary | ICD-10-CM | POA: Diagnosis not present

## 2016-10-25 IMAGING — CR DG RIBS 2V*L*
1 series · 3 of 3 positions shown · non-contrast
Comparison: 11/19/2014

CLINICAL DATA: Left side pain, was wrestling [REDACTED] with his
grandson

EXAM:
LEFT RIBS - 2 VIEW

[Series 1: dxr ribs left unilateral · 0.14mm/px · 3 of 3 slices shown]
[im 1/3]
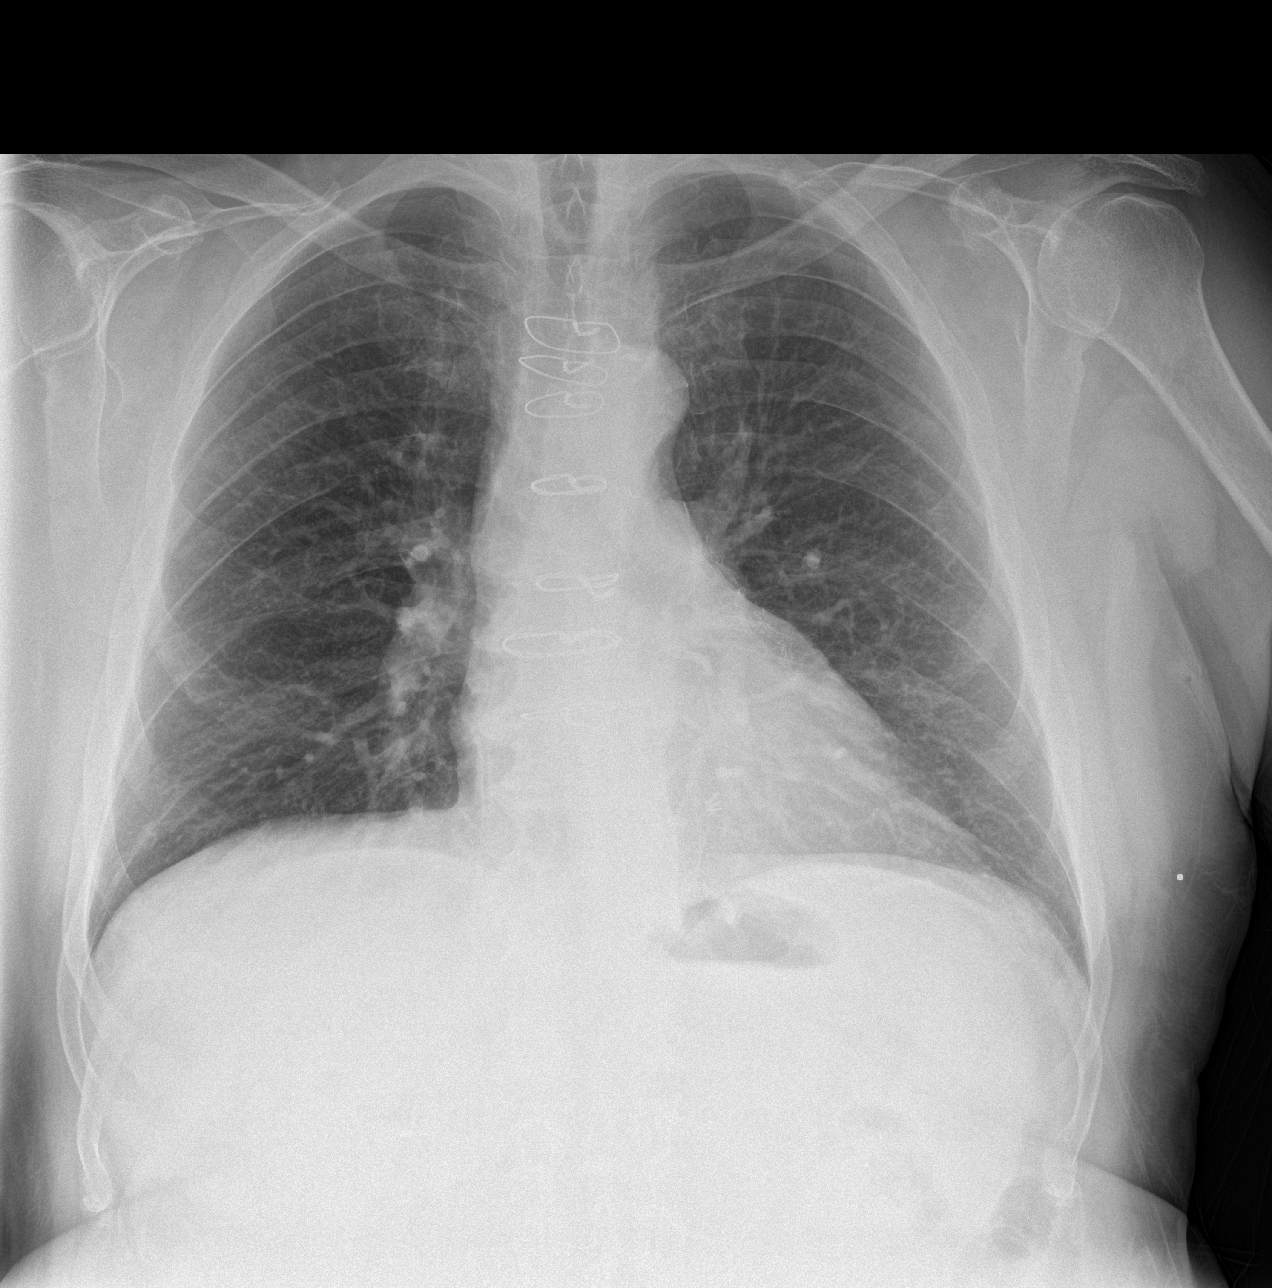
[im 2/3]
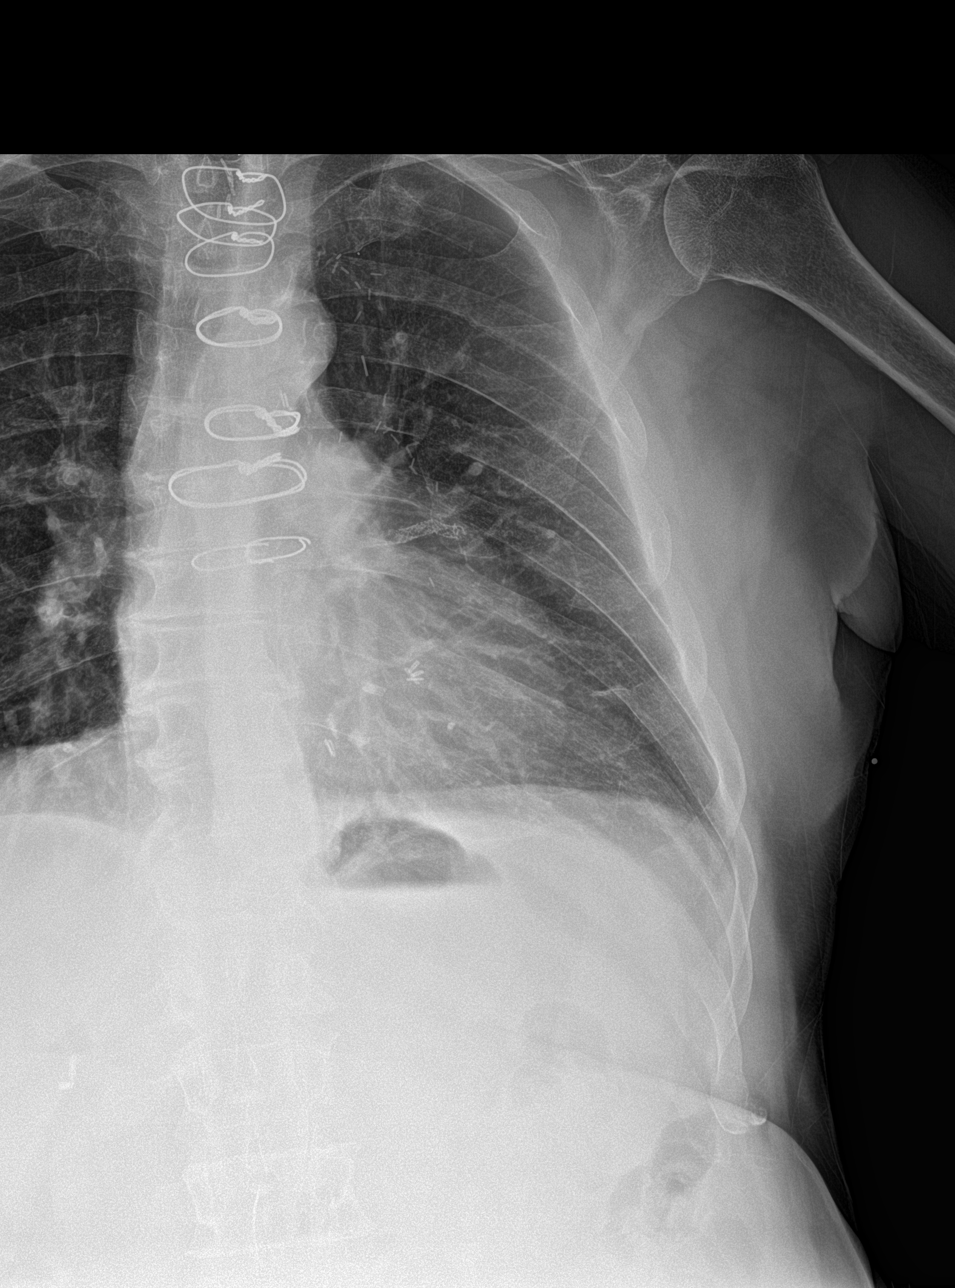
[im 3/3]
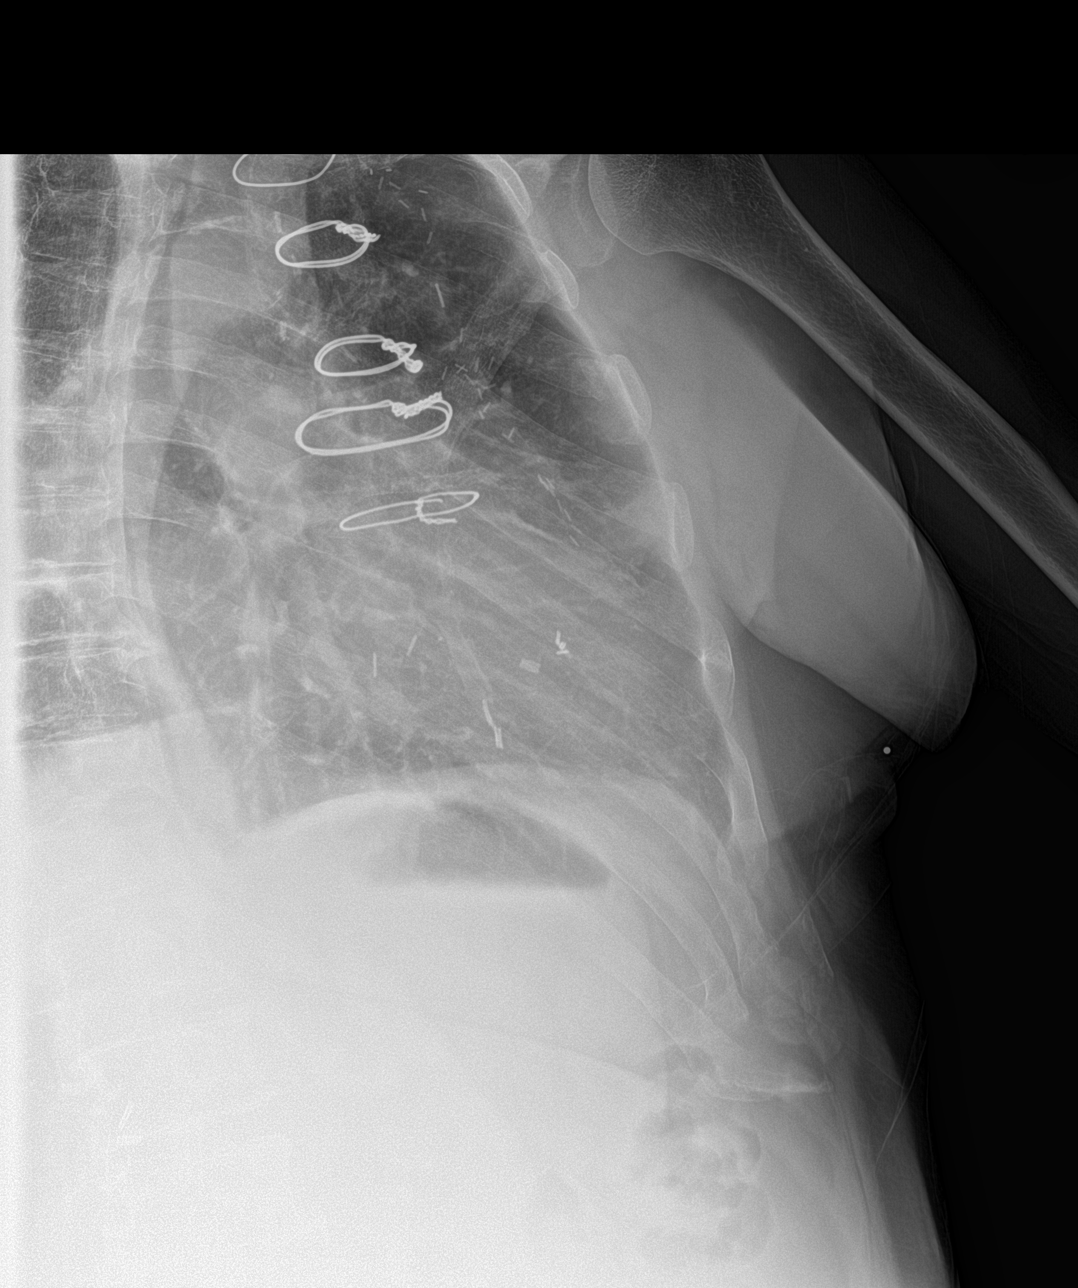

[3 of 3 positions shown; findings below may reference images not displayed]

FINDINGS: Three views left ribs submitted. No acute infiltrate or pulmonary
edema. The patient is status post median sternotomy. No left rib
fracture is identified. No pneumothorax.
IMPRESSION: Negative.

## 2016-10-27 ENCOUNTER — Ambulatory Visit (INDEPENDENT_AMBULATORY_CARE_PROVIDER_SITE_OTHER): Payer: Commercial Managed Care - HMO

## 2016-10-27 ENCOUNTER — Ambulatory Visit (INDEPENDENT_AMBULATORY_CARE_PROVIDER_SITE_OTHER): Payer: Commercial Managed Care - HMO | Admitting: Family Medicine

## 2016-10-27 VITALS — BP 143/81 | HR 64 | Temp 97.6°F | Ht 67.0 in | Wt 246.1 lb

## 2016-10-27 DIAGNOSIS — E876 Hypokalemia: Secondary | ICD-10-CM

## 2016-10-27 DIAGNOSIS — Z1159 Encounter for screening for other viral diseases: Secondary | ICD-10-CM | POA: Diagnosis not present

## 2016-10-27 DIAGNOSIS — E1159 Type 2 diabetes mellitus with other circulatory complications: Secondary | ICD-10-CM | POA: Diagnosis not present

## 2016-10-27 DIAGNOSIS — Z125 Encounter for screening for malignant neoplasm of prostate: Secondary | ICD-10-CM | POA: Diagnosis not present

## 2016-10-27 DIAGNOSIS — E785 Hyperlipidemia, unspecified: Secondary | ICD-10-CM

## 2016-10-27 DIAGNOSIS — Z0000 Encounter for general adult medical examination without abnormal findings: Secondary | ICD-10-CM

## 2016-10-27 DIAGNOSIS — Z114 Encounter for screening for human immunodeficiency virus [HIV]: Secondary | ICD-10-CM

## 2016-10-27 LAB — POCT UA - MICROALBUMIN: Microalbumin Ur, POC: NEGATIVE mg/L

## 2016-10-27 LAB — POCT GLYCOSYLATED HEMOGLOBIN (HGB A1C)
Est. average glucose Bld gHb Est-mCnc: 157
Hemoglobin A1C: 7.1

## 2016-10-27 NOTE — Progress Notes (Signed)
Subjective:   Lawrence [name] is a 60 y.o. male who presents for an Initial Medicare Annual Wellness Visit.  Review of Systems  N/A  Cardiac Risk Factors include: advanced age (>31mn, >>73women);male gender;obesity (BMI >30kg/m2);dyslipidemia;hypertension;diabetes mellitus    Objective:    Today's Vitals   10/27/16 1257 10/27/16 1304  BP: (!) 143/81   Pulse: 64   Temp: 97.6 F (36.4 C)   TempSrc: Oral   Weight: 246 lb 2 oz (111.6 kg)   Height: 5' 7"  (1.702 m)   PainSc: 8  8   PainLoc: Back    Body mass index is 38.55 kg/m.  Current Medications (verified) Outpatient Encounter Prescriptions as of 10/27/2016  Medication Sig  . albuterol (PROVENTIL HFA;VENTOLIN HFA) 108 (90 Base) MCG/ACT inhaler Inhale 4-6 puffs into the lungs every 4 (four) hours as needed for wheezing or shortness of breath. Reported on 04/08/2016  . allopurinol (ZYLOPRIM) 100 MG tablet THREE (3) TABLETS TWICE DAILY  . ALPRAZolam (XANAX) 1 MG tablet Take 0.5-1 tablets (0.5-1 mg total) by mouth 3 (three) times daily as needed for anxiety.  .Marland KitchenamLODipine (NORVASC) 10 MG tablet Take 10 mg by mouth daily.  .Marland Kitchenaspirin 81 MG EC tablet Take 81 mg by mouth daily. Swallow whole.  .Marland Kitchenatorvastatin (LIPITOR) 80 MG tablet TAKE 1 TABLET BY MOUTH EVERY NIGHT AT BEDTIME.  . budesonide-formoterol (SYMBICORT) 160-4.5 MCG/ACT inhaler Inhale 2 puffs into the lungs 2 (two) times daily.  . cetirizine (ZYRTEC) 10 MG tablet TAKE ONE TABLET AT BEDTIME  . cyclobenzaprine (FLEXERIL) 10 MG tablet TAKE 1 TABLET BY MOUTH EVERY 8 HOURS AS NEEEDED FOR MUSCLE SPASM  . ELIQUIS 5 MG TABS tablet TAKE ONE TABLET TWICE DAILY  . ferrous sulfate 325 (65 FE) MG tablet Take 325 mg by mouth daily with breakfast.  . fluticasone furoate-vilanterol (BREO ELLIPTA) 100-25 MCG/INH AEPB Inhale 1 puff into the lungs daily.   . fluticasone furoate-vilanterol (BREO ELLIPTA) 100-25 MCG/INH AEPB Inhale 1 puff into the lungs daily.  .Marland Kitchenglucose blood (ACCU-CHEK  AVIVA PLUS) test strip Use to check blood sugar once a day  . lansoprazole (PREVACID) 30 MG capsule Take 30 mg by mouth 2 (two) times daily.   . magnesium oxide (MAG-OX) 400 (241.3 Mg) MG tablet Take 400 mg by mouth daily.  . metFORMIN (GLUCOPHAGE) 500 MG tablet Take by mouth daily. Pt takes one daily  . metFORMIN (GLUCOPHAGE) 500 MG tablet TAKE 1 TABLET BY MOUTH EVERY MORNING.  . metoprolol tartrate (LOPRESSOR) 25 MG tablet Take 0.5 tablets (12.5 mg total) by mouth 2 (two) times daily.  . nitroGLYCERIN (NITROSTAT) 0.4 MG SL tablet Place 1 tablet (0.4 mg total) under the tongue every 5 (five) minutes as needed for chest pain. Reported on 05/26/2016  . omega-3 acid ethyl esters (LOVAZA) 1 g capsule TAKE FOUR CAPSULES BY MOUTH DAILY  . oxyCODONE-acetaminophen (PERCOCET) 10-325 MG tablet Take 1 tablet by mouth every 4 (four) hours as needed.  .Marland KitchenRANEXA 500 MG 12 hr tablet TAKE TWO (2) TABLETS TWICE A DAY  . senna (SENOKOT) 8.6 MG TABS tablet Take 2 tablets by mouth daily.  . sotalol (BETAPACE) 120 MG tablet Take 120 mg by mouth daily.  . sucralfate (CARAFATE) 1 g tablet Take 1 tablet (1 g total) by mouth 2 (two) times daily.  . tamsulosin (FLOMAX) 0.4 MG CAPS capsule Take 0.4 mg by mouth daily after breakfast.   . tiotropium (SPIRIVA) 18 MCG inhalation capsule Place 18 mcg into inhaler and  inhale daily.  Marland Kitchen torsemide (DEMADEX) 100 MG tablet Take 100 mg by mouth daily.   No facility-administered encounter medications on file as of 10/27/2016.     Allergies (verified) Demerol [meperidine]; Prednisone; Sulfa antibiotics; Albuterol sulfate [albuterol]; and Morphine sulfate   History: Past Medical History:  Diagnosis Date  . A-fib ([place])   . Anemia   . Anxiety   . Asthma   . CAD (coronary artery disease)    a. 2002 CABGx2 (LIMA->LAD, VG->VG->OM1);  b. 09/2012 DES->OM;  c. 03/2015 PTCA of LAD [facility]) in setting of atretic LIMA; d. 05/2015 Cath [facility]): nonobs dzs; e. 06/2015 Cath ([facility]): LM nl, LAD 45p/d  ISR, 50d, D1/2 small, LCX 50p/d ISR, OM1 70ost, 30 ISR, VG->OM1 50ost, 77m LIMA->LAD 99p/d - atretic, RCA dom, nl; f.cath 10/16: 40-50%(FFR 0.90) pLAD, 75% (FFR 0.77) mLAD s/p PCI/DES, oRCA 40% (FFR0.95)  . Celiac disease   . Chronic diastolic CHF (congestive heart failure) ([place]    a. 06/2009 Echo: EF 60-65%, Gr 1 DD, triv AI, mildly dil LA, nl RV.  .Marland KitchenCOPD (chronic obstructive pulmonary disease) ([place]    a. Chronic bronchitis and emphysema.  .Marland KitchenDysrhythmia   . Essential hypertension   . History of tobacco abuse    a. Quit 2014.  .Marland KitchenPSVT (paroxysmal supraventricular tachycardia) [place]    a. 10/2012 Noted on Zio Patch.  . Type II diabetes mellitus ([place]    Past Surgical History:  Procedure Laterality Date  . BYPASS GRAFT    . CARDIAC CATHETERIZATION N/A 07/12/2015   rocedure: Left Heart Cath and Cors/Grafts Angiography;  Surgeon: HBelva Crome MD;  [place] [place];  Service: Cardiovascular;  Laterality: N/A;  . CARDIAC CATHETERIZATION Right 10/07/2015   Procedure: Left Heart Cath and Cors/Grafts Angiography;  Surgeon: SDionisio David MD;  [place] [facility];  Service: Cardiovascular;  Laterality: Right;  . CARDIAC CATHETERIZATION N/A 04/06/2016   Procedure: Left Heart Cath and Coronary Angiography;  Surgeon: DYolonda Kida MD;  [place];  Service: Cardiovascular;  Laterality: N/A;  . CARDIAC CATHETERIZATION  04/06/2016   Procedure: Bypass Graft Angiography;  Surgeon: DYolonda Kida MD;  [place];  Service: Cardiovascular;;  . CHOLECYSTECTOMY    . ESOPHAGEAL DILATION    . TONSILLECTOMY    . VASCULAR SURGERY     Family History  Problem Relation Age of Onset  . Heart attack Mother   . Depression Mother   . Heart disease Mother   . COPD Mother   . Hypertension Mother   . Heart attack Father   . Diabetes Father   . Depression Father   . Heart disease Father   . Cirrhosis Father   . Parkinson's disease Brother     Social History   Occupational History  . Disabled    Social History Main Topics  . Smoking status: Former Smoker    Quit date: 04/22/2013  . Smokeless tobacco: Never Used  . Alcohol use No     Comment: remotely quit alcohol use. Hx of heavy alcohol use.  . Drug use: No  . Sexual activity: Not on file   Tobacco Counseling Counseling given: Not Answered   Activities of Daily Living In your present state of health, do you have any difficulty performing the following activities: 10/27/2016 09/03/2016  Hearing? N N  Vision? N N  Difficulty concentrating or making decisions? N N  Walking or climbing stairs? Y Y  Dressing or  bathing? N N  Doing errands, shopping? N Y  Conservation officer, nature and eating ? N -  Using the Toilet? N -  In the past six months, have you accidently leaked urine? N -  Do you have problems with loss of bowel control? N -  Managing your Medications? N -  Managing your Finances? N -  Housekeeping or managing your Housekeeping? N -  Some recent data might be hidden    Immunizations and Health Maintenance Immunization History  Administered Date(s) Administered  . Influenza,inj,Quad PF,36+ Mos 08/02/2015, 07/17/2016  . Pneumococcal Polysaccharide-23 05/06/2004, 08/22/2015  . Td 10/28/2009  . Tdap 05/13/2011   Health Maintenance Due  Topic Date Due  . URINE MICROALBUMIN  01/07/1966  . FOOT EXAM  09/22/2013    Patient Care Team: Birdie Sons, MD as PCP - General (Family Medicine) Allyne Gee, MD as Consulting Physician (Pulmonary Disease) Lyman Speller, RN as [facility] Corey Skains, MD as Consulting Physician (Cardiology)  Indicate any recent Medical Services you may have received from other than [facility] providers in the past year (date may be approximate).    Assessment:   This is a routine wellness examination for Zayyan.   Hearing/Vision screen Vision Screening Comments: Pt has not seen an eye doctor in 2  years. Pt will call to schedule appt for 2018.  Dietary issues and exercise activities discussed: Current Exercise Habits: The patient does not participate in regular exercise at present, Type of exercise: walking, Exercise limited by: respiratory conditions(s) (COPD)  Goals    . Increase water intake          Starting 10/27/16, I will increase my water intake to 4-5 glasses a day.      Depression Screen PHQ 2/9 Scores 10/27/2016 04/16/2016 09/06/2015  PHQ - 2 Score 0 0 0  PHQ- 9 Score - - 2    Fall Risk Fall Risk  10/27/2016 04/16/2016  Falls in the past year? Yes Yes  Number falls in past yr: 2 or more 2 or more  Injury with Fall? No No  Risk for fall due to : - Impaired mobility  Follow up Falls prevention discussed Falls prevention discussed    Cognitive Function:     6CIT Screen 10/27/2016  What Year? 0 points  What month? 0 points  What time? 0 points  Count back from 20 0 points  Months in reverse 4 points  Repeat phrase 4 points  Total Score 8    Screening Tests Health Maintenance  Topic Date Due  . URINE MICROALBUMIN  01/07/1966  . FOOT EXAM  09/22/2013  . OPHTHALMOLOGY EXAM  10/27/2017 (Originally 01/07/1966)  . ZOSTAVAX  10/27/2017 (Originally 01/06/2014)  . HEMOGLOBIN A1C  12/20/2016  . COLONOSCOPY  03/10/2020  . TETANUS/TDAP  05/12/2021  . PNEUMOCOCCAL POLYSACCHARIDE VACCINE  Completed  . INFLUENZA VACCINE  Completed  . Hepatitis C Screening  Completed  . HIV Screening  Completed        Plan:  I have personally reviewed and addressed the Medicare Annual Wellness questionnaire and have noted the following in the patient's chart:  A. Medical and social history B. Use of alcohol, tobacco or illicit drugs  C. Current medications and supplements D. Functional ability and status E.  Nutritional status F.  Physical activity G. Advance directives H. List of other physicians I.  Hospitalizations, surgeries, and ER visits in previous 12 months J.    Vitals K. Screenings such as hearing  and vision if needed, cognitive and depression L. Referrals and appointments - none  In addition, I have reviewed and discussed with patient certain preventive protocols, quality metrics, and best practice recommendations. A written personalized care plan for preventive services as well as general preventive health recommendations were provided to patient.  See attached scanned questionnaire for additional information.   Signed,  Fabio Neighbors, LPN Nurse Health Advisor     MD Recommendations: follow up with diabetic foot exam and urine microalbumin screen. Appointment with PCP following this appointment.   I have reviewed the health advisor's note, was available for consultation, and agree with documentation and plan  [name], MD

## 2016-10-27 NOTE — Progress Notes (Signed)
Patient: Lawrence [name], Male    DOB: Feb 26, 1956, 60 y.o.   MRN: [id number] Visit Date: 10/27/2016  Today's Provider: [name], MD   Chief Complaint  Patient presents with  . Annual Exam  . Diabetes    follow up  . Hyperlipidemia    follow up  . Hypertension    follow up  . Gastroesophageal Reflux    follow up  . COPD    follow up   Subjective:    Annual physical exam Lawrence [name] is a 60 y.o. male who presents today for health maintenance and complete physical. He feels fairly well. He reports rarely exercising. He reports he is sleeping fairly well.  -----------------------------------------------------------------  Diabetes Mellitus Type II, Follow-up:   Lab Results  Component Value Date   HGBA1C 6.6 06/19/2016   HGBA1C 6.3 (H) 04/03/2016   HGBA1C 6.4 (H) 03/26/2016   Last seen for diabetes 4 months ago.  Management since then includes no changes. He reports good compliance with treatment. He is not having side effects.  Current symptoms include none and have been stable. Home blood sugar records: fasting range: 112-120  Episodes of hypoglycemia? no   Current Insulin Regimen: none Most Recent Eye Exam: 2 years ago Weight trend: increasing steadily Prior visit with dietician: no Current diet: in general, a "healthy" diet   Current exercise: none  ------------------------------------------------------------------------   Hypertension, follow-up:  BP Readings from Last 3 Encounters:  10/27/16 (!) 143/81  10/07/16 122/70  10/03/16 131/86    He was last seen for hypertension 4 months ago.  BP at that visit was 132/74. Management since that visit includes no changes.He reports good compliance with treatment. He is not having side effects.  He is not exercising. He is adherent to low salt diet.   Outside blood pressures are checked at the pharmacy 2 times a week. He is experiencing chest pain, chest pressure/discomfort, dyspnea and  lower extremity edema.  Patient denies irregular heart beat, near-syncope, syncope and tachypnea.   Cardiovascular risk factors include advanced age (older than 55 for men, 61 for women), diabetes mellitus, dyslipidemia, hypertension and male gender.  Use of agents associated with hypertension: NSAIDS.   ------------------------------------------------------------------------    Lipid/Cholesterol, Follow-up:   Last seen for this 1 years ago.  Management since that visit includes no changes.  Last Lipid Panel:    Component Value Date/Time   CHOL 113 09/13/2015 0347   CHOL 92 05/23/2014 0415   TRIG 164 (H) 09/13/2015 0347   TRIG 109 05/23/2014 0415   HDL 29 (L) 09/13/2015 0347   HDL 28 (L) 05/23/2014 0415   CHOLHDL 3.9 09/13/2015 0347   VLDL 33 09/13/2015 0347   VLDL 22 05/23/2014 0415   LDLCALC 51 09/13/2015 0347   LDLCALC 42 05/23/2014 0415    He reports good compliance with treatment. He is not having side effects.   Wt Readings from Last 3 Encounters:  10/27/16 246 lb 2 oz (111.6 kg)  10/07/16 240 lb (108.9 kg)  10/03/16 243 lb (110.2 kg)    ------------------------------------------------------------------------   Review of Systems  Constitutional: Negative for appetite change, chills, fatigue and fever.  HENT: Negative for congestion, ear pain, hearing loss, nosebleeds and trouble swallowing.   Eyes: Negative for pain and visual disturbance.  Respiratory: Positive for cough, shortness of breath and wheezing. Negative for chest tightness.   Cardiovascular: Positive for chest pain and leg swelling. Negative for palpitations.  Gastrointestinal: Negative for abdominal  pain, blood in stool, constipation, diarrhea, nausea and vomiting.  Endocrine: Negative for polydipsia, polyphagia and polyuria.  Genitourinary: Negative for dysuria and flank pain.  Musculoskeletal: Negative for arthralgias, back pain, joint swelling, myalgias and neck stiffness.  Skin: Negative for  color change, rash and wound.  Neurological: Negative for dizziness, tremors, seizures, speech difficulty, weakness, light-headedness and headaches.  Psychiatric/Behavioral: Negative for behavioral problems, confusion, decreased concentration, dysphoric mood and sleep disturbance. The patient is not nervous/anxious.   All other systems reviewed and are negative.   Social History      He  reports that he quit smoking about 3 years ago. He has never used smokeless tobacco. He reports that he does not drink alcohol or use drugs.       Social History   Social History  . Marital status: Married    Spouse name: N/A  . Number of children: N/A  . Years of education: N/A   Occupational History  . Disabled    Social History Main Topics  . Smoking status: Former Smoker    Quit date: 04/22/2013  . Smokeless tobacco: Never Used  . Alcohol use No     Comment: remotely quit alcohol use. Hx of heavy alcohol use.  . Drug use: No  . Sexual activity: Not on file   Other Topics Concern  . Not on file   Social History Narrative   Pt lives in [place] with wife.  Does not routinely exercise.    Past Medical History:  Diagnosis Date  . A-fib ([place])   . Anemia   . Anxiety   . Asthma   . CAD (coronary artery disease)    a. 2002 CABGx2 (LIMA->LAD, VG->VG->OM1);  b. 09/2012 DES->OM;  c. 03/2015 PTCA of LAD [facility]) in setting of atretic LIMA; d. 05/2015 Cath [facility]): nonobs dzs; e. 06/2015 Cath ([facility]): LM nl, LAD 45p/d ISR, 50d, D1/2 small, LCX 50p/d ISR, OM1 70ost, 30 ISR, VG->OM1 50ost, 64m LIMA->LAD 99p/d - atretic, RCA dom, nl; f.cath 10/16: 40-50%(FFR 0.90) pLAD, 75% (FFR 0.77) mLAD s/p PCI/DES, oRCA 40% (FFR0.95)  . Celiac disease   . Chronic diastolic CHF (congestive heart failure) ([place]    a. 06/2009 Echo: EF 60-65%, Gr 1 DD, triv AI, mildly dil LA, nl RV.  .Marland KitchenCOPD (chronic obstructive pulmonary disease) ([facility]    a. Chronic bronchitis and emphysema.  .Marland KitchenDysrhythmia   . Essential hypertension   .  History of tobacco abuse    a. Quit 2014.  .Marland KitchenPSVT (paroxysmal supraventricular tachycardia) ([facility]    a. 10/2012 Noted on [facility].  . Type II diabetes mellitus ([facility]      Patient Active Problem List   Diagnosis Date Noted  . Dyspnea 04/03/2016  . Hypotension 04/03/2016  . Chronic kidney disease 04/03/2016  . Anemia 04/03/2016  . Paroxysmal atrial fibrillation ([place] 12/23/2015  . OSA (obstructive sleep apnea) 12/10/2015  . Left inguinal hernia 11/07/2015  . Anxiety 11/07/2015  . Unstable angina ([facility] 10/05/2015  . PAF (paroxysmal atrial fibrillation) ([place] 10/05/2015  . Back pain with left-sided radiculopathy 09/30/2015  . Nocturnal hypoxia 09/06/2015  . BPH (benign prostatic hyperplasia) 08/01/2015  . Chronic diastolic CHF (congestive heart failure) ([place]   . Angina pectoris ([place]   . Chest pain 07/11/2015  . COPD (chronic obstructive pulmonary disease) ([place] 07/03/2015  . CAD (coronary artery disease) 06/26/2015  . HTN (hypertension) 06/26/2015  . DM (diabetes mellitus) ([place] 06/26/2015  . Achalasia 07/24/2014  . GERD (gastroesophageal reflux disease) 06/07/2014  .  Former tobacco use 04/11/2013  . HLD (hyperlipidemia) 04/09/2013    Past Surgical History:  Procedure Laterality Date  . BYPASS GRAFT    . CARDIAC CATHETERIZATION N/A 07/12/2015   rocedure: Left Heart Cath and Cors/Grafts Angiography;  Surgeon: Belva Crome, MD;  Location: [place] [facility];  Service: Cardiovascular;  Laterality: N/A;  . CARDIAC CATHETERIZATION Right 10/07/2015   Procedure: Left Heart Cath and Cors/Grafts Angiography;  Surgeon: Dionisio David, MD;  Location: [place] [facility];  Service: Cardiovascular;  Laterality: Right;  . CARDIAC CATHETERIZATION N/A 04/06/2016   Procedure: Left Heart Cath and Coronary Angiography;  Surgeon: Yolonda Kida, MD;  Location: [place] [facility];  Service: Cardiovascular;  Laterality: N/A;  . CARDIAC CATHETERIZATION  04/06/2016   Procedure: Bypass Graft  Angiography;  Surgeon: Yolonda Kida, MD;  Location: [place] [facility];  Service: Cardiovascular;;  . CHOLECYSTECTOMY    . ESOPHAGEAL DILATION    . TONSILLECTOMY    . VASCULAR SURGERY      Family History        Family Status  Relation Status  . Mother Deceased at age 69  . Father Deceased at age 37   . Brother Deceased  . Sister Deceased at age 26        His family history includes COPD in his mother; Cirrhosis in his father; Depression in his father and mother; Diabetes in his father; Heart attack in his father and mother; Heart disease in his father and mother; Hypertension in his mother; Parkinson's disease in his brother.     Allergies  Allergen Reactions  . Demerol [Meperidine] Hives  . Prednisone Other (See Comments) and Hypertension    Pt states that this medication puts him in A-fib   . Sulfa Antibiotics Hives  . Albuterol Sulfate [Albuterol] Palpitations and Other (See Comments)    Pt currently uses this medication.    . Morphine Sulfate Nausea And Vomiting, Rash and Other (See Comments)    Pt states that he is only allergic to the tablet form of this medication.       Current Outpatient Prescriptions:  .  albuterol (PROVENTIL HFA;VENTOLIN HFA) 108 (90 Base) MCG/ACT inhaler, Inhale 4-6 puffs into the lungs every 4 (four) hours as needed for wheezing or shortness of breath. Reported on 04/08/2016, Disp: , Rfl:  .  allopurinol (ZYLOPRIM) 100 MG tablet, THREE (3) TABLETS TWICE DAILY, Disp: 90 tablet, Rfl: 12 .  ALPRAZolam (XANAX) 1 MG tablet, Take 0.5-1 tablets (0.5-1 mg total) by mouth 3 (three) times daily as needed for anxiety., Disp: 90 tablet, Rfl: 3 .  amLODipine (NORVASC) 10 MG tablet, Take 10 mg by mouth daily., Disp: , Rfl:  .  aspirin 81 MG EC tablet, Take 81 mg by mouth daily. Swallow whole., Disp: , Rfl:  .  atorvastatin (LIPITOR) 80 MG tablet, TAKE 1 TABLET BY MOUTH EVERY NIGHT AT BEDTIME., Disp: 90 tablet, Rfl: 4 .  budesonide-formoterol (SYMBICORT)  160-4.5 MCG/ACT inhaler, Inhale 2 puffs into the lungs 2 (two) times daily., Disp: , Rfl:  .  cetirizine (ZYRTEC) 10 MG tablet, TAKE ONE TABLET AT BEDTIME, Disp: 30 tablet, Rfl: 12 .  cyclobenzaprine (FLEXERIL) 10 MG tablet, TAKE 1 TABLET BY MOUTH EVERY 8 HOURS AS NEEEDED FOR MUSCLE SPASM, Disp: 30 tablet, Rfl: 5 .  ELIQUIS 5 MG TABS tablet, TAKE ONE TABLET TWICE DAILY, Disp: 60 tablet, Rfl: 12 .  ferrous sulfate 325 (65 FE) MG tablet, Take 325 mg by mouth  daily with breakfast., Disp: , Rfl:  .  fluticasone furoate-vilanterol (BREO ELLIPTA) 100-25 MCG/INH AEPB, Inhale 1 puff into the lungs daily. , Disp: , Rfl:  .  fluticasone furoate-vilanterol (BREO ELLIPTA) 100-25 MCG/INH AEPB, Inhale 1 puff into the lungs daily., Disp: , Rfl:  .  glucose blood (ACCU-CHEK AVIVA PLUS) test strip, Use to check blood sugar once a day, Disp: 100 each, Rfl: 4 .  lansoprazole (PREVACID) 30 MG capsule, Take 30 mg by mouth 2 (two) times daily. , Disp: , Rfl:  .  magnesium oxide (MAG-OX) 400 (241.3 Mg) MG tablet, Take 400 mg by mouth daily., Disp: , Rfl:  .  metFORMIN (GLUCOPHAGE) 500 MG tablet, Take by mouth daily. Pt takes one daily, Disp: , Rfl:  .  metFORMIN (GLUCOPHAGE) 500 MG tablet, TAKE 1 TABLET BY MOUTH EVERY MORNING., Disp: 90 tablet, Rfl: 4 .  metoprolol tartrate (LOPRESSOR) 25 MG tablet, Take 0.5 tablets (12.5 mg total) by mouth 2 (two) times daily., Disp: 30 tablet, Rfl: 0 .  nitroGLYCERIN (NITROSTAT) 0.4 MG SL tablet, Place 1 tablet (0.4 mg total) under the tongue every 5 (five) minutes as needed for chest pain. Reported on 05/26/2016, Disp: 30 tablet, Rfl: 1 .  omega-3 acid ethyl esters (LOVAZA) 1 g capsule, TAKE FOUR CAPSULES BY MOUTH DAILY, Disp: 120 capsule, Rfl: 6 .  oxyCODONE-acetaminophen (PERCOCET) 10-325 MG tablet, Take 1 tablet by mouth every 4 (four) hours as needed., Disp: 150 tablet, Rfl: 0 .  RANEXA 500 MG 12 hr tablet, TAKE TWO (2) TABLETS TWICE A DAY, Disp: 120 tablet, Rfl: 4 .  senna  (SENOKOT) 8.6 MG TABS tablet, Take 2 tablets by mouth daily., Disp: , Rfl:  .  sotalol (BETAPACE) 120 MG tablet, Take 120 mg by mouth daily., Disp: , Rfl:  .  sucralfate (CARAFATE) 1 g tablet, Take 1 tablet (1 g total) by mouth 2 (two) times daily., Disp: 20 tablet, Rfl: 0 .  tamsulosin (FLOMAX) 0.4 MG CAPS capsule, Take 0.4 mg by mouth daily after breakfast. , Disp: , Rfl:  .  tiotropium (SPIRIVA) 18 MCG inhalation capsule, Place 18 mcg into inhaler and inhale daily., Disp: , Rfl:  .  torsemide (DEMADEX) 100 MG tablet, Take 100 mg by mouth daily., Disp: , Rfl:    Patient Care Team: Birdie Sons, MD as PCP - General (Family Medicine) Allyne Gee, MD as Consulting Physician (Pulmonary Disease) Lyman Speller, RN as [place] Management Corey Skains, MD as Consulting Physician (Cardiology)      Objective:   Vitals:  BP    143/81 (BP Location: Right Arm)     Pulse  64     Temp  97.6 F (36.4 C) (Oral)     Ht  5' 7"  (1.702 m)     Wt  246 lb 2 oz (111.6 kg)      BMI  38.55 kg/m       Physical Exam   General Appearance:    Alert, cooperative, no distress, appears stated age  Head:    Normocephalic, without obvious abnormality, atraumatic  Eyes:    PERRL, conjunctiva/corneas clear, EOM's intact, fundi    benign, both eyes       Ears:    Normal TM's and external ear canals, both ears  Nose:   Nares normal, septum midline, mucosa normal, no drainage   or sinus tenderness  Throat:   Lips, mucosa, and tongue normal; teeth and gums normal  Neck:  Supple, symmetrical, trachea midline, no adenopathy;       thyroid:  No enlargement/tenderness/nodules; no carotid   bruit or JVD  Back:     Symmetric, no curvature, ROM normal, no CVA tenderness  Lungs:     Clear to auscultation bilaterally, respirations unlabored  Chest wall:    No tenderness or deformity  Heart:    Regular rate and rhythm, S1 and S2 normal, no murmur, rub   or gallop    Abdomen:     Soft, non-tender, bowel sounds active all four quadrants,    no masses, no organomegaly  Genitalia:    deferred  Rectal:    deferred  Extremities:   Extremities normal, atraumatic, no cyanosis or edema  Pulses:   2+ and symmetric all extremities  Skin:   Skin color, texture, turgor normal, no rashes or lesions  Lymph nodes:   Cervical, supraclavicular, and axillary nodes normal  Neurologic:   CNII-XII intact. Normal strength, sensation and reflexes      throughout    Depression Screen PHQ 2/9 Scores 10/27/2016 04/16/2016 09/06/2015  PHQ - 2 Score 0 0 0  PHQ- 9 Score - - 2    Results for orders placed or performed in visit on 10/27/16  Lipid panel  Result Value Ref Range   Cholesterol, Total 146 100 - 199 mg/dL   Triglycerides 213 (H) 0 - 149 mg/dL   HDL 39 (L) >39 mg/dL   VLDL Cholesterol Cal 43 (H) 5 - 40 mg/dL   LDL Calculated 64 0 - 99 mg/dL   Chol/HDL Ratio 3.7 0.0 - 5.0 ratio units  Renal function panel  Result Value Ref Range   Glucose 120 (H) 65 - 99 mg/dL   BUN 10 8 - 27 mg/dL   Creatinine, Ser 1.02 0.76 - 1.27 mg/dL   GFR calc non Af Amer 78 >59 mL/min/1.73   GFR calc Af Amer 91 >59 mL/min/1.73   BUN/Creatinine Ratio 10 10 - 24   Sodium 143 134 - 144 mmol/L   Potassium 3.9 3.5 - 5.2 mmol/L   Chloride 98 96 - 106 mmol/L   CO2 29 18 - 29 mmol/L   Calcium 10.0 8.6 - 10.2 mg/dL   Phosphorus 3.4 2.5 - 4.5 mg/dL   Albumin 4.4 3.6 - 4.8 g/dL  PSA  Result Value Ref Range   Prostate Specific Ag, Serum 1.4 0.0 - 4.0 ng/mL  Magnesium  Result Value Ref Range   Magnesium 2.1 1.6 - 2.3 mg/dL  HIV antibody  Result Value Ref Range   HIV Screen 4th Generation wRfx Non Reactive Non Reactive  Hepatitis C antibody  Result Value Ref Range   Hep C Virus Ab <0.1 0.0 - 0.9 s/co ratio  POCT HgB A1C  Result Value Ref Range   Hemoglobin A1C 7.1    Est. average glucose Bld gHb Est-mCnc 157   POCT UA - Microalbumin  Result Value Ref Range   Microalbumin Ur, POC  neg mg/L   Creatinine, POC n/a mg/dL   Albumin/Creatinine Ratio, Urine, POC n/a      Assessment & Plan:     Routine Health Maintenance and Physical Exam  Exercise Activities and Dietary recommendations Goals    . Increase water intake          Starting 10/27/16, I will increase my water intake to 4-5 glasses a day.       Immunization History  Administered Date(s) Administered  . Influenza,inj,Quad PF,36+ Mos 08/02/2015, 07/17/2016  . Pneumococcal  Polysaccharide-23 05/06/2004, 08/22/2015  . Td 10/28/2009  . Tdap 05/13/2011    Health Maintenance  Topic Date Due  . URINE MICROALBUMIN  01/07/1964  . FOOT EXAM  09/22/2013  . OPHTHALMOLOGY EXAM  10/27/2017 (Originally 01/07/1964)  . ZOSTAVAX  10/27/2017 (Originally 01/06/2014)  . HEMOGLOBIN A1C  12/20/2016  . COLONOSCOPY  03/10/2020  . TETANUS/TDAP  05/12/2021  . PNEUMOCOCCAL POLYSACCHARIDE VACCINE  Completed  . INFLUENZA VACCINE  Completed  . Hepatitis C Screening  Completed  . HIV Screening  Completed     Discussed health benefits of physical activity, and encouraged him to engage in regular exercise appropriate for his age and condition.    --------------------------------------------------------------------  1. Annual physical exam Currently doing well.   2. Type 2 diabetes mellitus with other circulatory complication, without long-term current use of insulin (HCC) Stable. Continue current medications.   - POCT HgB A1C - POCT UA - Microalbumin  3. Prostate cancer screening  - PSA  4. Hyperlipidemia, unspecified hyperlipidemia type He is tolerating atorvastatin well with no adverse effects.   - Lipid panel  5. Hypokalemia  - Renal function panel - Magnesium   The entirety of the information documented in the History of Present Illness, Review of Systems and Physical Exam were personally obtained by me. Portions of this information were initially documented by Meyer Cory, CMA and reviewed by me  for thoroughness and accuracy.    [name], MD  [facility]

## 2016-10-27 NOTE — Patient Instructions (Signed)
Lawrence [name] , Thank you for taking time to come for your Medicare Wellness Visit. I appreciate your ongoing commitment to your health goals. Please review the following plan we discussed and let me know if I can assist you in the future.   These are the goals we discussed: Goals    . Increase water intake          Starting 10/27/16, I will increase my water intake to 4-5 glasses a day.       This is a list of the screening recommended for you and due dates:  Health Maintenance  Topic Date Due  .  Hepatitis C: One time screening is recommended by Center for Disease Control  (CDC) for  adults born from [birth year] through 1967.   Jul 24, 1956  . Complete foot exam   01/07/1964  . Eye exam for diabetics  01/07/1964  . Urine Protein Check  01/07/1964  . HIV Screening  01/06/1969  . Shingles Vaccine  01/06/2014  . Hemoglobin A1C  12/20/2016  . Colon Cancer Screening  03/10/2020  . Tetanus Vaccine  05/12/2021  . Pneumococcal vaccine  Completed  . Flu Shot  Completed   Preventive Care for Adults  A healthy lifestyle and preventive care can promote health and wellness. Preventive health guidelines for adults include the following key practices.  . A routine yearly physical is a good way to check with your health care provider about your health and preventive screening. It is a chance to share any concerns and updates on your health and to receive a thorough exam.  . Visit your dentist for a routine exam and preventive care every 6 months. Brush your teeth twice a day and floss once a day. Good oral hygiene prevents tooth decay and gum disease.  . The frequency of eye exams is based on your age, health, family medical history, use  of contact lenses, and other factors. Follow your health care provider's ecommendations for frequency of eye exams.  . Eat a healthy diet. Foods like vegetables, fruits, whole grains, low-fat dairy products, and lean protein foods contain the nutrients you need  without too many calories. Decrease your intake of foods high in solid fats, added sugars, and salt. Eat the right amount of calories for you. Get information about a proper diet from your health care provider, if necessary.  . Regular physical exercise is one of the most important things you can do for your health. Most adults should get at least 150 minutes of moderate-intensity exercise (any activity that increases your heart rate and causes you to sweat) each week. In addition, most adults need muscle-strengthening exercises on 2 or more days a week.  Silver Sneakers may be a benefit available to you. To determine eligibility, you may visit the website: www.silversneakers.com or contact program at (229)194-1293 Mon-Fri between 8AM-8PM.   . Maintain a healthy weight. The body mass index (BMI) is a screening tool to identify possible weight problems. It provides an estimate of body fat based on height and weight. Your health care provider can find your BMI and can help you achieve or maintain a healthy weight.   For adults 20 years and older: ? A BMI below 18.5 is considered underweight. ? A BMI of 18.5 to 24.9 is normal. ? A BMI of 25 to 29.9 is considered overweight. ? A BMI of 30 and above is considered obese.   . Maintain normal blood lipids and cholesterol levels by exercising and minimizing your intake  of saturated fat. Eat a balanced diet with plenty of fruit and vegetables. Blood tests for lipids and cholesterol should begin at age 68 and be repeated every 5 years. If your lipid or cholesterol levels are high, you are over 50, or you are at high risk for heart disease, you may need your cholesterol levels checked more frequently. Ongoing high lipid and cholesterol levels should be treated with medicines if diet and exercise are not working.  . If you smoke, find out from your health care provider how to quit. If you do not use tobacco, please do not start.  . If you choose to drink  alcohol, please do not consume more than 2 drinks per day. One drink is considered to be 12 ounces (355 mL) of beer, 5 ounces (148 mL) of wine, or 1.5 ounces (44 mL) of liquor.  . If you are 37-16 years old, ask your health care provider if you should take aspirin to prevent strokes.  . Use sunscreen. Apply sunscreen liberally and repeatedly throughout the day. You should seek shade when your shadow is shorter than you. Protect yourself by wearing long sleeves, pants, a wide-brimmed hat, and sunglasses year round, whenever you are outdoors.  . Once a month, do a whole body skin exam, using a mirror to look at the skin on your back. Tell your health care provider of new moles, moles that have irregular borders, moles that are larger than a pencil eraser, or moles that have changed in shape or color.

## 2016-10-28 ENCOUNTER — Ambulatory Visit: Payer: Commercial Managed Care - HMO | Admitting: Family Medicine

## 2016-10-28 LAB — RENAL FUNCTION PANEL
Albumin: 4.4 g/dL (ref 3.6–4.8)
BUN/Creatinine Ratio: 10 (ref 10–24)
BUN: 10 mg/dL (ref 8–27)
CO2: 29 mmol/L (ref 18–29)
Calcium: 10 mg/dL (ref 8.6–10.2)
Chloride: 98 mmol/L (ref 96–106)
Creatinine, Ser: 1.02 mg/dL (ref 0.76–1.27)
GFR calc Af Amer: 91 mL/min/{1.73_m2} (ref >59)
GFR calc non Af Amer: 78 mL/min/{1.73_m2} (ref >59)
Glucose: 120 mg/dL — ABNORMAL HIGH (ref 65–99)
Phosphorus: 3.4 mg/dL (ref 2.5–4.5)
Potassium: 3.9 mmol/L (ref 3.5–5.2)
Sodium: 143 mmol/L (ref 134–144)

## 2016-10-28 LAB — HEPATITIS C ANTIBODY: Hep C Virus Ab: <0.1 s/co ratio (ref 0.0–0.9)

## 2016-10-28 LAB — LIPID PANEL
Chol/HDL Ratio: 3.7 ratio units (ref 0.0–5.0)
Cholesterol, Total: 146 mg/dL (ref 100–199)
HDL: 39 mg/dL — ABNORMAL LOW (ref >39)
LDL Calculated: 64 mg/dL (ref 0–99)
Triglycerides: 213 mg/dL — ABNORMAL HIGH (ref 0–149)
VLDL Cholesterol Cal: 43 mg/dL — ABNORMAL HIGH (ref 5–40)

## 2016-10-28 LAB — HIV ANTIBODY (ROUTINE TESTING W REFLEX): HIV Screen 4th Generation wRfx: NONREACTIVE

## 2016-10-28 LAB — MAGNESIUM: Magnesium: 2.1 mg/dL (ref 1.6–2.3)

## 2016-10-28 LAB — PSA: Prostate Specific Ag, Serum: 1.4 ng/mL (ref 0.0–4.0)

## 2016-10-29 ENCOUNTER — Emergency Department: Payer: Commercial Managed Care - HMO

## 2016-10-29 ENCOUNTER — Observation Stay
Admission: EM | Admit: 2016-10-29 | Discharge: 2016-11-03 | Disposition: A | Discharge status: Home / Self Care | Payer: Commercial Managed Care - HMO | Attending: Specialist | Admitting: Specialist

## 2016-10-29 DIAGNOSIS — K219 Gastro-esophageal reflux disease without esophagitis: Secondary | ICD-10-CM | POA: Diagnosis not present

## 2016-10-29 DIAGNOSIS — J449 Chronic obstructive pulmonary disease, unspecified: Secondary | ICD-10-CM | POA: Diagnosis present

## 2016-10-29 DIAGNOSIS — Z8379 Family history of other diseases of the digestive system: Secondary | ICD-10-CM | POA: Insufficient documentation

## 2016-10-29 DIAGNOSIS — E1121 Type 2 diabetes mellitus with diabetic nephropathy: Secondary | ICD-10-CM

## 2016-10-29 DIAGNOSIS — E785 Hyperlipidemia, unspecified: Secondary | ICD-10-CM | POA: Diagnosis not present

## 2016-10-29 DIAGNOSIS — I200 Unstable angina: Secondary | ICD-10-CM | POA: Diagnosis present

## 2016-10-29 DIAGNOSIS — D649 Anemia, unspecified: Secondary | ICD-10-CM | POA: Insufficient documentation

## 2016-10-29 DIAGNOSIS — I10 Essential (primary) hypertension: Secondary | ICD-10-CM | POA: Diagnosis not present

## 2016-10-29 DIAGNOSIS — Z955 Presence of coronary angioplasty implant and graft: Secondary | ICD-10-CM | POA: Insufficient documentation

## 2016-10-29 DIAGNOSIS — I252 Old myocardial infarction: Secondary | ICD-10-CM | POA: Insufficient documentation

## 2016-10-29 DIAGNOSIS — F419 Anxiety disorder, unspecified: Secondary | ICD-10-CM | POA: Diagnosis not present

## 2016-10-29 DIAGNOSIS — I471 Supraventricular tachycardia: Secondary | ICD-10-CM | POA: Insufficient documentation

## 2016-10-29 DIAGNOSIS — Z820 Family history of epilepsy and other diseases of the nervous system: Secondary | ICD-10-CM | POA: Insufficient documentation

## 2016-10-29 DIAGNOSIS — G4733 Obstructive sleep apnea (adult) (pediatric): Secondary | ICD-10-CM | POA: Diagnosis present

## 2016-10-29 DIAGNOSIS — I700 Atherosclerosis of aorta: Secondary | ICD-10-CM | POA: Insufficient documentation

## 2016-10-29 DIAGNOSIS — I110 Hypertensive heart disease with heart failure: Secondary | ICD-10-CM | POA: Insufficient documentation

## 2016-10-29 DIAGNOSIS — N400 Enlarged prostate without lower urinary tract symptoms: Secondary | ICD-10-CM | POA: Diagnosis not present

## 2016-10-29 DIAGNOSIS — E119 Type 2 diabetes mellitus without complications: Secondary | ICD-10-CM | POA: Insufficient documentation

## 2016-10-29 DIAGNOSIS — I5023 Acute on chronic systolic (congestive) heart failure: Secondary | ICD-10-CM | POA: Diagnosis present

## 2016-10-29 DIAGNOSIS — R079 Chest pain, unspecified: Secondary | ICD-10-CM | POA: Insufficient documentation

## 2016-10-29 DIAGNOSIS — Z833 Family history of diabetes mellitus: Secondary | ICD-10-CM | POA: Insufficient documentation

## 2016-10-29 DIAGNOSIS — E669 Obesity, unspecified: Secondary | ICD-10-CM | POA: Diagnosis not present

## 2016-10-29 DIAGNOSIS — I2510 Atherosclerotic heart disease of native coronary artery without angina pectoris: Secondary | ICD-10-CM | POA: Diagnosis present

## 2016-10-29 DIAGNOSIS — Z888 Allergy status to other drugs, medicaments and biological substances status: Secondary | ICD-10-CM | POA: Insufficient documentation

## 2016-10-29 DIAGNOSIS — T82858A Stenosis of vascular prosthetic devices, implants and grafts, initial encounter: Secondary | ICD-10-CM | POA: Insufficient documentation

## 2016-10-29 DIAGNOSIS — K900 Celiac disease: Secondary | ICD-10-CM | POA: Diagnosis not present

## 2016-10-29 DIAGNOSIS — E782 Mixed hyperlipidemia: Secondary | ICD-10-CM | POA: Diagnosis present

## 2016-10-29 DIAGNOSIS — I482 Chronic atrial fibrillation: Secondary | ICD-10-CM | POA: Diagnosis not present

## 2016-10-29 DIAGNOSIS — Z6835 Body mass index (BMI) 35.0-35.9, adult: Secondary | ICD-10-CM | POA: Insufficient documentation

## 2016-10-29 DIAGNOSIS — Z818 Family history of other mental and behavioral disorders: Secondary | ICD-10-CM | POA: Insufficient documentation

## 2016-10-29 DIAGNOSIS — M109 Gout, unspecified: Secondary | ICD-10-CM | POA: Insufficient documentation

## 2016-10-29 DIAGNOSIS — I480 Paroxysmal atrial fibrillation: Secondary | ICD-10-CM | POA: Insufficient documentation

## 2016-10-29 DIAGNOSIS — I5032 Chronic diastolic (congestive) heart failure: Secondary | ICD-10-CM | POA: Diagnosis present

## 2016-10-29 DIAGNOSIS — I25110 Atherosclerotic heart disease of native coronary artery with unstable angina pectoris: Principal | ICD-10-CM | POA: Insufficient documentation

## 2016-10-29 DIAGNOSIS — Z87891 Personal history of nicotine dependence: Secondary | ICD-10-CM | POA: Diagnosis not present

## 2016-10-29 DIAGNOSIS — G8929 Other chronic pain: Secondary | ICD-10-CM | POA: Diagnosis not present

## 2016-10-29 DIAGNOSIS — I5022 Chronic systolic (congestive) heart failure: Secondary | ICD-10-CM | POA: Diagnosis present

## 2016-10-29 DIAGNOSIS — Z7982 Long term (current) use of aspirin: Secondary | ICD-10-CM | POA: Insufficient documentation

## 2016-10-29 DIAGNOSIS — Z885 Allergy status to narcotic agent status: Secondary | ICD-10-CM | POA: Insufficient documentation

## 2016-10-29 DIAGNOSIS — R0789 Other chest pain: Secondary | ICD-10-CM | POA: Diagnosis not present

## 2016-10-29 DIAGNOSIS — I214 Non-ST elevation (NSTEMI) myocardial infarction: Secondary | ICD-10-CM

## 2016-10-29 DIAGNOSIS — Z825 Family history of asthma and other chronic lower respiratory diseases: Secondary | ICD-10-CM | POA: Insufficient documentation

## 2016-10-29 DIAGNOSIS — Z7901 Long term (current) use of anticoagulants: Secondary | ICD-10-CM | POA: Insufficient documentation

## 2016-10-29 DIAGNOSIS — Z765 Malingerer [conscious simulation]: Secondary | ICD-10-CM | POA: Insufficient documentation

## 2016-10-29 DIAGNOSIS — Z8249 Family history of ischemic heart disease and other diseases of the circulatory system: Secondary | ICD-10-CM | POA: Insufficient documentation

## 2016-10-29 DIAGNOSIS — Z951 Presence of aortocoronary bypass graft: Secondary | ICD-10-CM | POA: Insufficient documentation

## 2016-10-29 DIAGNOSIS — F418 Other specified anxiety disorders: Secondary | ICD-10-CM | POA: Diagnosis present

## 2016-10-29 DIAGNOSIS — Z9049 Acquired absence of other specified parts of digestive tract: Secondary | ICD-10-CM | POA: Insufficient documentation

## 2016-10-29 DIAGNOSIS — Z882 Allergy status to sulfonamides status: Secondary | ICD-10-CM | POA: Insufficient documentation

## 2016-10-29 LAB — CBC
HCT: 47.2 % (ref 40.0–52.0)
Hemoglobin: 15.7 g/dL (ref 13.0–18.0)
MCH: 29 pg (ref 26.0–34.0)
MCHC: 33.4 g/dL (ref 32.0–36.0)
MCV: 86.9 fL (ref 80.0–100.0)
Platelets: 256 10*3/uL (ref 150–440)
RBC: 5.43 MIL/uL (ref 4.40–5.90)
RDW: 18.3 % — ABNORMAL HIGH (ref 11.5–14.5)
WBC: 8.4 10*3/uL (ref 3.8–10.6)

## 2016-10-29 LAB — BASIC METABOLIC PANEL
Anion gap: 9 (ref 5–15)
BUN: 12 mg/dL (ref 6–20)
CO2: 33 mmol/L — ABNORMAL HIGH (ref 22–32)
Calcium: 9.4 mg/dL (ref 8.9–10.3)
Chloride: 97 mmol/L — ABNORMAL LOW (ref 101–111)
Creatinine, Ser: 1.23 mg/dL (ref 0.61–1.24)
GFR calc Af Amer: >60 mL/min (ref >60)
GFR calc non Af Amer: >60 mL/min (ref >60)
Glucose, Bld: 111 mg/dL — ABNORMAL HIGH (ref 65–99)
Potassium: 3.2 mmol/L — ABNORMAL LOW (ref 3.5–5.1)
Sodium: 139 mmol/L (ref 135–145)

## 2016-10-29 LAB — PROTIME-INR
INR: 1.1
Prothrombin Time: 14.2 seconds (ref 11.4–15.2)

## 2016-10-29 LAB — TROPONIN I
Troponin I: 0.47 ng/mL (ref <0.03)
Troponin I: 0.38 ng/mL (ref <0.03)

## 2016-10-29 LAB — APTT: aPTT: 35 seconds (ref 24–36)

## 2016-10-29 LAB — GLUCOSE, CAPILLARY: Glucose-Capillary: 124 mg/dL — ABNORMAL HIGH (ref 65–99)

## 2016-10-29 MED ORDER — METOPROLOL TARTRATE 25 MG PO TABS
12.5000 mg | ORAL_TABLET | Freq: Two times a day (BID) | ORAL | Status: DC
Start: 2016-10-29 — End: 2016-11-03
  Administered 2016-10-29 – 2016-11-03 (×7): 12.5 mg via ORAL
  Filled 2016-10-29 (×8): qty 1

## 2016-10-29 MED ORDER — AMLODIPINE BESYLATE 10 MG PO TABS
10.0000 mg | ORAL_TABLET | Freq: Every day | ORAL | Status: DC
  Administered 2016-10-30 – 2016-11-03 (×4): 10 mg via ORAL
  Filled 2016-10-29 (×6): qty 1

## 2016-10-29 MED ORDER — ONDANSETRON HCL 4 MG/2ML IJ SOLN
4.0000 mg | Freq: Four times a day (QID) | INTRAMUSCULAR | Status: DC | PRN
  Administered 2016-10-30: 4 mg via INTRAVENOUS
  Filled 2016-10-29: qty 2

## 2016-10-29 MED ORDER — TIOTROPIUM BROMIDE MONOHYDRATE 18 MCG IN CAPS
18.0000 ug | ORAL_CAPSULE | Freq: Every day | RESPIRATORY_TRACT | Status: DC
  Administered 2016-10-30 – 2016-11-03 (×5): 18 ug via RESPIRATORY_TRACT
  Filled 2016-10-29 (×2): qty 5

## 2016-10-29 MED ORDER — ASPIRIN 81 MG PO CHEW: 324.0000 mg | CHEWABLE_TABLET | Freq: Once | ORAL | Status: DC

## 2016-10-29 MED ORDER — INSULIN ASPART 100 UNIT/ML ~~LOC~~ SOLN
0.0000 [IU] | Freq: Three times a day (TID) | SUBCUTANEOUS | Status: DC
  Administered 2016-10-30: 2 [IU] via SUBCUTANEOUS
  Administered 2016-10-30 – 2016-10-31 (×3): 1 [IU] via SUBCUTANEOUS
  Administered 2016-10-31 – 2016-11-01 (×2): 2 [IU] via SUBCUTANEOUS
  Administered 2016-11-01: 1 [IU] via SUBCUTANEOUS
  Administered 2016-11-02: 2 [IU] via SUBCUTANEOUS
  Administered 2016-11-02 – 2016-11-03 (×2): 1 [IU] via SUBCUTANEOUS
  Filled 2016-10-29 (×2): qty 2
  Filled 2016-10-29: qty 1
  Filled 2016-10-29: qty 2
  Filled 2016-10-29: qty 1
  Filled 2016-10-29: qty 2
  Filled 2016-10-29 (×6): qty 1

## 2016-10-29 MED ORDER — OXYCODONE HCL 5 MG PO TABS
5.0000 mg | ORAL_TABLET | ORAL | Status: DC | PRN
  Administered 2016-10-29 – 2016-11-02 (×6): 5 mg via ORAL
  Filled 2016-10-29 (×6): qty 1

## 2016-10-29 MED ORDER — TORSEMIDE 20 MG PO TABS
100.0000 mg | ORAL_TABLET | Freq: Every day | ORAL | Status: DC
  Administered 2016-10-30 – 2016-11-03 (×5): 100 mg via ORAL
  Filled 2016-10-29 (×5): qty 5

## 2016-10-29 MED ORDER — PANTOPRAZOLE SODIUM 40 MG PO TBEC
40.0000 mg | DELAYED_RELEASE_TABLET | Freq: Two times a day (BID) | ORAL | Status: DC
  Administered 2016-10-29 – 2016-11-03 (×9): 40 mg via ORAL
  Filled 2016-10-29 (×9): qty 1

## 2016-10-29 MED ORDER — ONDANSETRON HCL 4 MG PO TABS
4.0000 mg | ORAL_TABLET | Freq: Four times a day (QID) | ORAL | Status: DC | PRN
  Administered 2016-10-31: 4 mg via ORAL
  Filled 2016-10-29: qty 1

## 2016-10-29 MED ORDER — MORPHINE SULFATE (PF) 4 MG/ML IV SOLN
4.0000 mg | Freq: Once | INTRAVENOUS | Status: AC
Start: 2016-10-29 — End: 2016-10-29
  Administered 2016-10-29: 4 mg via INTRAVENOUS

## 2016-10-29 MED ORDER — FLUTICASONE FUROATE-VILANTEROL 100-25 MCG/INH IN AEPB
1.0000 | INHALATION_SPRAY | Freq: Every day | RESPIRATORY_TRACT | Status: DC
  Administered 2016-10-30 – 2016-11-03 (×5): 1 via RESPIRATORY_TRACT
  Filled 2016-10-29: qty 28

## 2016-10-29 MED ORDER — ASPIRIN EC 81 MG PO TBEC
81.0000 mg | DELAYED_RELEASE_TABLET | Freq: Every day | ORAL | Status: DC
  Administered 2016-10-30 – 2016-11-03 (×4): 81 mg via ORAL
  Filled 2016-10-29 (×4): qty 1

## 2016-10-29 MED ORDER — SOTALOL HCL 80 MG PO TABS
120.0000 mg | ORAL_TABLET | Freq: Every day | ORAL | Status: DC
  Administered 2016-10-31 – 2016-11-03 (×4): 120 mg via ORAL
  Filled 2016-10-29 (×4): qty 2

## 2016-10-29 MED ORDER — ACETAMINOPHEN 650 MG RE SUPP: 650.0000 mg | Freq: Four times a day (QID) | RECTAL | Status: DC | PRN

## 2016-10-29 MED ORDER — TAMSULOSIN HCL 0.4 MG PO CAPS
0.4000 mg | ORAL_CAPSULE | Freq: Every day | ORAL | Status: DC
  Administered 2016-10-31 – 2016-11-03 (×3): 0.4 mg via ORAL
  Filled 2016-10-29 (×3): qty 1

## 2016-10-29 MED ORDER — MORPHINE SULFATE (PF) 4 MG/ML IV SOLN
INTRAVENOUS | Status: AC
  Administered 2016-10-29: 4 mg via INTRAVENOUS
  Filled 2016-10-29: qty 1

## 2016-10-29 MED ORDER — NITROGLYCERIN 0.4 MG SL SUBL
0.4000 mg | SUBLINGUAL_TABLET | SUBLINGUAL | Status: DC | PRN
  Administered 2016-10-29: 0.4 mg via SUBLINGUAL
  Filled 2016-10-29: qty 1

## 2016-10-29 MED ORDER — SUCRALFATE 1 G PO TABS
1.0000 g | ORAL_TABLET | Freq: Two times a day (BID) | ORAL | Status: DC
Start: 2016-10-29 — End: 2016-11-03
  Administered 2016-10-29 – 2016-11-03 (×9): 1 g via ORAL
  Filled 2016-10-29 (×9): qty 1

## 2016-10-29 MED ORDER — ASPIRIN 81 MG PO CHEW
162.0000 mg | CHEWABLE_TABLET | Freq: Once | ORAL | Status: AC
  Administered 2016-10-29: 162 mg via ORAL
  Filled 2016-10-29: qty 2

## 2016-10-29 MED ORDER — OXYCODONE-ACETAMINOPHEN 10-325 MG PO TABS: 1.0000 | ORAL_TABLET | ORAL | Status: DC | PRN

## 2016-10-29 MED ORDER — ATORVASTATIN CALCIUM 20 MG PO TABS
80.0000 mg | ORAL_TABLET | Freq: Every day | ORAL | Status: DC
  Administered 2016-10-30 – 2016-11-02 (×4): 80 mg via ORAL
  Filled 2016-10-29 (×4): qty 4

## 2016-10-29 MED ORDER — OXYCODONE-ACETAMINOPHEN 5-325 MG PO TABS
1.0000 | ORAL_TABLET | ORAL | Status: DC | PRN
  Administered 2016-10-30 – 2016-11-02 (×15): 1 via ORAL
  Filled 2016-10-29 (×15): qty 1

## 2016-10-29 MED ORDER — INSULIN ASPART 100 UNIT/ML ~~LOC~~ SOLN
0.0000 [IU] | Freq: Every day | SUBCUTANEOUS | Status: DC
  Administered 2016-11-02: 2 [IU] via SUBCUTANEOUS
  Filled 2016-10-29: qty 2

## 2016-10-29 MED ORDER — ALBUTEROL SULFATE (2.5 MG/3ML) 0.083% IN NEBU: 2.5000 mg | INHALATION_SOLUTION | RESPIRATORY_TRACT | Status: DC | PRN

## 2016-10-29 MED ORDER — ALPRAZOLAM 0.5 MG PO TABS
0.5000 mg | ORAL_TABLET | Freq: Three times a day (TID) | ORAL | Status: DC | PRN
  Administered 2016-10-29 – 2016-11-02 (×4): 1 mg via ORAL
  Filled 2016-10-29 (×4): qty 2

## 2016-10-29 MED ORDER — ACETAMINOPHEN 325 MG PO TABS: 650.0000 mg | ORAL_TABLET | Freq: Four times a day (QID) | ORAL | Status: DC | PRN

## 2016-10-29 MED ORDER — RANOLAZINE ER 500 MG PO TB12
500.0000 mg | ORAL_TABLET | Freq: Two times a day (BID) | ORAL | Status: DC
  Administered 2016-10-30 – 2016-10-31 (×3): 500 mg via ORAL
  Filled 2016-10-29 (×3): qty 1

## 2016-10-29 MED ORDER — NITROGLYCERIN 0.4 MG SL SUBL
SUBLINGUAL_TABLET | SUBLINGUAL | Status: AC
  Administered 2016-10-29: 0.4 mg via SUBLINGUAL
  Filled 2016-10-29: qty 3

## 2016-10-29 MED ORDER — MORPHINE SULFATE (PF) 4 MG/ML IV SOLN
2.0000 mg | INTRAVENOUS | Status: DC | PRN
  Administered 2016-10-30 – 2016-11-01 (×12): 2 mg via INTRAVENOUS
  Filled 2016-10-29 (×12): qty 1

## 2016-10-29 NOTE — H&P (Signed)
[place] [facility] [place] NAME: Lawrence [name]    MR#:  [id number]  DATE OF BIRTH:  [birth year]  DATE OF ADMISSION:  10/29/2016  PRIMARY CARE PHYSICIAN: [name], MD   REQUESTING/REFERRING PHYSICIAN: Archie Balboa, MD  CHIEF COMPLAINT:   Chief Complaint  Patient presents with  . Chest Pain    HISTORY OF PRESENT ILLNESS:  Lawrence [name]  is a 60 y.o. male who presents with chest pain for several days that radiates to his left arm and is pressure like in nature.  This pain occurs at rest or with exertion, and has no significant alleviating factors.  His pain is associated with shortness of breath.  He has a known history of extensive CAD, and had prior cath recommending medical management as he did not have good interventional/surgical options based on his lesions.  Despite this he has had persistent episodes of angina.  He was recently admitted here with angina and had a negative workup at that time (troponins negative x3).  However, today he has elevated troponin at 0.47.  Hospitalists were called for admission.  PAST MEDICAL HISTORY:   Past Medical History:  Diagnosis Date  . A-fib ([place])   . Anemia   . Anxiety   . Asthma   . CAD (coronary artery disease)    a. 2002 CABGx2 (LIMA->LAD, VG->VG->OM1);  b. 09/2012 DES->OM;  c. 03/2015 PTCA of LAD [facility]) in setting of atretic LIMA; d. 05/2015 Cath [facility]): nonobs dzs; e. 06/2015 Cath ([facility]): LM nl, LAD 45p/d ISR, 50d, D1/2 small, LCX 50p/d ISR, OM1 70ost, 30 ISR, VG->OM1 50ost, 32m LIMA->LAD 99p/d - atretic, RCA dom, nl; f.cath 10/16: 40-50%(FFR 0.90) pLAD, 75% (FFR 0.77) mLAD s/p PCI/DES, oRCA 40% (FFR0.95)  . Celiac disease   . Chronic diastolic CHF (congestive heart failure) ([place]    a. 06/2009 Echo: EF 60-65%, Gr 1 DD, triv AI, mildly dil LA, nl RV.  .Marland KitchenCOPD (chronic obstructive pulmonary disease) ([place]    a. Chronic bronchitis and emphysema.  .Marland KitchenDysrhythmia   . Essential hypertension   .  History of tobacco abuse    a. Quit 2014.  .Marland KitchenPSVT (paroxysmal supraventricular tachycardia) ([place]    a. 10/2012 Noted on Zio Patch.  . Type II diabetes mellitus ([place]     PAST SURGICAL HISTORY:   Past Surgical History:  Procedure Laterality Date  . BYPASS GRAFT    . CARDIAC CATHETERIZATION N/A 07/12/2015   rocedure: Left Heart Cath and Cors/Grafts Angiography;  Surgeon: HBelva Crome MD;  [facility];  Service: Cardiovascular;  Laterality: N/A;  . CARDIAC CATHETERIZATION Right 10/07/2015   Procedure: Left Heart Cath and Cors/Grafts Angiography;  Surgeon: SDionisio  MD;  [facility];  Service: Cardiovascular;  Laterality: Right;  . CARDIAC CATHETERIZATION N/A 04/06/2016   Procedure: Left Heart Cath and Coronary Angiography;  Surgeon: DYolonda Kida MD;  [facility];  Service: Cardiovascular;  Laterality: N/A;  . CARDIAC CATHETERIZATION  04/06/2016   Procedure: Bypass Graft Angiography;  Surgeon: DYolonda Kida MD;  [facility];  Service: Cardiovascular;;  . CHOLECYSTECTOMY    . ESOPHAGEAL DILATION    . TONSILLECTOMY    . VASCULAR SURGERY      SOCIAL HISTORY:   Social History  Substance Use Topics  . Smoking status: Former Smoker    Quit date: 04/22/2013  . Smokeless tobacco: Never Used  . Alcohol use No  Comment: remotely quit alcohol use. Hx of heavy alcohol use.    FAMILY HISTORY:   Family History  Problem Relation Age of Onset  . Heart attack Mother   . Depression Mother   . Heart disease Mother   . COPD Mother   . Hypertension Mother   . Heart attack Father   . Diabetes Father   . Depression Father   . Heart disease Father   . Cirrhosis Father   . Parkinson's disease Brother     DRUG ALLERGIES:   Allergies  Allergen Reactions  . Demerol [Meperidine] Hives  . Prednisone Other (See Comments) and Hypertension    Pt states that this medication puts him in A-fib   . Sulfa  Antibiotics Hives  . Albuterol Sulfate [Albuterol] Palpitations and Other (See Comments)    Pt currently uses this medication.    . Morphine Sulfate Nausea And Vomiting, Rash and Other (See Comments)    Pt states that he is only allergic to the tablet form of this medication.      MEDICATIONS AT HOME:   Prior to Admission medications   Medication Sig Start Date End Date Taking? Authorizing Provider  albuterol (PROVENTIL HFA;VENTOLIN HFA) 108 (90 Base) MCG/ACT inhaler Inhale 4-6 puffs into the lungs every 4 (four) hours as needed for wheezing or shortness of breath. Reported on 04/08/2016   Yes Historical Provider, MD  allopurinol (ZYLOPRIM) 100 MG tablet THREE (3) TABLETS TWICE DAILY 04/25/16  Yes Birdie Sons, MD  ALPRAZolam Duanne Moron) 1 MG tablet Take 0.5-1 tablets (0.5-1 mg total) by mouth 3 (three) times daily as needed for anxiety. 05/26/16  Yes Birdie Sons, MD  amLODipine (NORVASC) 10 MG tablet Take 10 mg by mouth daily.   Yes Historical Provider, MD  apixaban (ELIQUIS) 5 MG TABS tablet Take 5 mg by mouth 2 (two) times daily.   Yes Historical Provider, MD  aspirin 81 MG EC tablet Take 81 mg by mouth daily. Swallow whole.   Yes Historical Provider, MD  atorvastatin (LIPITOR) 80 MG tablet TAKE 1 TABLET BY MOUTH EVERY NIGHT AT BEDTIME. 05/26/16  Yes Birdie Sons, MD  cetirizine (ZYRTEC) 10 MG tablet TAKE ONE TABLET AT BEDTIME 04/19/16  Yes Birdie Sons, MD  cyclobenzaprine (FLEXERIL) 10 MG tablet TAKE 1 TABLET BY MOUTH EVERY 8 HOURS AS NEEEDED FOR MUSCLE SPASM 07/25/16  Yes Birdie Sons, MD  docusate sodium (COLACE) 100 MG capsule Take 100 mg by mouth 2 (two) times daily.   Yes Historical Provider, MD  ferrous sulfate 325 (65 FE) MG tablet Take 325 mg by mouth daily with breakfast. 08/03/16 08/03/17 Yes Historical Provider, MD  fluticasone furoate-vilanterol (BREO ELLIPTA) 100-25 MCG/INH AEPB Inhale 1 puff into the lungs daily.    Yes Historical Provider, MD  lansoprazole (PREVACID) 30  MG capsule Take 30 mg by mouth 2 (two) times daily.    Yes Historical Provider, MD  magnesium oxide (MAG-OX) 400 (241.3 Mg) MG tablet Take 400 mg by mouth daily.   Yes Historical Provider, MD  metFORMIN (GLUCOPHAGE) 500 MG tablet TAKE 1 TABLET BY MOUTH EVERY MORNING. 10/05/16  Yes Birdie Sons, MD  metoprolol tartrate (LOPRESSOR) 25 MG tablet Take 0.5 tablets (12.5 mg total) by mouth 2 (two) times daily. Patient taking differently: Take 25 mg by mouth daily.  06/26/16  Yes Epifanio Lesches, MD  nitroGLYCERIN (NITROSTAT) 0.4 MG SL tablet Place 1 tablet (0.4 mg total) under the tongue every 5 (five) minutes as needed for  chest pain. Reported on 05/26/2016 10/06/16  Yes Birdie Sons, MD  omega-3 acid ethyl esters (LOVAZA) 1 g capsule TAKE FOUR CAPSULES BY MOUTH DAILY 07/25/16  Yes Birdie Sons, MD  oxyCODONE-acetaminophen (PERCOCET) 10-325 MG tablet Take 1 tablet by mouth every 4 (four) hours as needed. 10/07/16  Yes Birdie Sons, MD  RANEXA 500 MG 12 hr tablet TAKE TWO (2) TABLETS TWICE A DAY 07/25/16  Yes Birdie Sons, MD  sotalol (BETAPACE) 120 MG tablet Take 120 mg by mouth daily.   Yes Historical Provider, MD  sucralfate (CARAFATE) 1 g tablet Take 1 tablet (1 g total) by mouth 2 (two) times daily. 04/22/16  Yes [name], MD  tamsulosin (FLOMAX) 0.4 MG CAPS capsule Take 0.4 mg by mouth daily after breakfast.    Yes Historical Provider, MD  tiotropium (SPIRIVA) 18 MCG inhalation capsule Place 18 mcg into inhaler and inhale daily.   Yes Historical Provider, MD  torsemide (DEMADEX) 100 MG tablet Take 100 mg by mouth daily.   Yes Historical Provider, MD  glucose blood (ACCU-CHEK AVIVA PLUS) test strip Use to check blood sugar once a day 08/21/16   Birdie Sons, MD    REVIEW OF SYSTEMS:  Review of Systems  Constitutional: Negative for chills, fever, malaise/fatigue and weight loss.  HENT: Negative for ear pain, hearing loss and tinnitus.   Eyes: Negative for blurred vision,  double vision, pain and redness.  Respiratory: Positive for shortness of breath. Negative for cough and hemoptysis.   Cardiovascular: Positive for chest pain. Negative for palpitations, orthopnea and leg swelling.  Gastrointestinal: Negative for abdominal pain, constipation, diarrhea, nausea and vomiting.  Genitourinary: Negative for dysuria, frequency and hematuria.  Musculoskeletal: Negative for back pain, joint pain and neck pain.  Skin:       No acne, rash, or lesions  Neurological: Negative for dizziness, tremors, focal weakness and weakness.  Endo/Heme/Allergies: Negative for polydipsia. Does not bruise/bleed easily.  Psychiatric/Behavioral: Negative for depression. The patient is not nervous/anxious and does not have insomnia.      VITAL SIGNS:   Vitals:   10/29/16 1908 10/29/16 1911 10/29/16 2030 10/29/16 2046  BP:  (!) 154/88 126/84 118/72  Pulse:  79 68 77  Resp:  (!) 24 13 10   Temp:  98.4 F (36.9 C)    TempSrc:  Oral    SpO2:  93% 92% 92%  Weight: 112.5 kg (248 lb)     Height: 5' 7"  (1.702 m)      Wt Readings from Last 3 Encounters:  10/29/16 112.5 kg (248 lb)  10/27/16 111.6 kg (246 lb 2 oz)  10/07/16 108.9 kg (240 lb)    PHYSICAL EXAMINATION:  Physical Exam  Vitals reviewed. Constitutional: He is oriented to person, place, and time. He appears well-developed and well-nourished. No distress.  HENT:  Head: Normocephalic and atraumatic.  Mouth/Throat: Oropharynx is clear and moist.  Eyes: Conjunctivae and EOM are normal. Pupils are equal, round, and reactive to light. No scleral icterus.  Neck: Normal range of motion. Neck supple. No JVD present. No thyromegaly present.  Cardiovascular: Normal rate, regular rhythm and intact distal pulses.  Exam reveals no gallop and no friction rub.   No murmur heard. Respiratory: Effort normal and breath sounds normal. No respiratory distress. He has no wheezes. He has no rales.  GI: Soft. Bowel sounds are normal. He exhibits  no distension. There is no tenderness.  Musculoskeletal: Normal range of motion. He exhibits no edema.  No arthritis, no gout  Lymphadenopathy:    He has no cervical adenopathy.  Neurological: He is alert and oriented to person, place, and time. No cranial nerve deficit.  No dysarthria, no aphasia  Skin: Skin is warm and dry. No rash noted. No erythema.  Psychiatric: He has a normal mood and affect. His behavior is normal. Judgment and thought content normal.    LABORATORY PANEL:   CBC  Recent Labs Lab 10/29/16 1910  WBC 8.4  HGB 15.7  HCT 47.2  PLT 256   ------------------------------------------------------------------------------------------------------------------  Chemistries   Recent Labs Lab 10/27/16 1435 10/29/16 1910  NA 143 139  K 3.9 3.2*  CL 98 97*  CO2 29 33*  GLUCOSE 120* 111*  BUN 10 12  CREATININE 1.02 1.23  CALCIUM 10.0 9.4  MG 2.1  --    ------------------------------------------------------------------------------------------------------------------  Cardiac Enzymes  Recent Labs Lab 10/29/16 1910  TROPONINI 0.47*   ------------------------------------------------------------------------------------------------------------------  RADIOLOGY:  Dg Chest 2 View  Result Date: 10/29/2016 CLINICAL DATA:  Chest pain EXAM: CHEST  2 VIEW COMPARISON:  Chest radiograph 10/03/2016 FINDINGS: There are median sternotomy wires. There is calcification within the aortic arch. There is left basilar atelectasis. No pleural effusion or pneumothorax. No pulmonary edema. IMPRESSION: Left basilar atelectasis and aortic atherosclerosis. No pulmonary edema. Electronically Signed   By: Ulyses Jarred M.D.   On: 10/29/2016 19:25    EKG:   Orders placed or performed during the hospital encounter of 10/29/16  . ED EKG within 10 minutes  . ED EKG within 10 minutes  . EKG 12-Lead  . EKG 12-Lead    IMPRESSION AND PLAN:  Principal Problem:   Unstable angina (HCC)  - Lawrence [name] history of coronary artery disease as well as persistent recurrent episodes unstable angina. In the recent past he had workup that was negative, however today he comes in with slightly elevated troponin. We will trend his enzymes tonight, keep him on anticoagulation. He was on eliquis previously, he will have heparin drip started. Will get a cardiology consult in the morning. I will not order echocardiogram at this time as he has had several done this year. The last echo finding on chart review was in September. I will defer to cardiology's recommendations as to whether or not he needs an echocardiogram. Active Problems:   CAD (coronary artery disease) - continue home meds, other workup as above, control heart rate and blood pressure.   HTN (hypertension) - stable, continue home meds   DM (diabetes mellitus) ([place]) - sliding scale insulin with corresponding glucose checks   Chronic diastolic CHF (congestive heart failure) (HCC) - not an exacerbation, continue home meds   PAF (paroxysmal atrial fibrillation) ([place]) - continue home meds including anticoagulation   Anxiety - home dose anxiolytics   OSA (obstructive sleep apnea) - CPAP daily at bedtime   GERD (gastroesophageal reflux disease) - home dose PPI   HLD (hyperlipidemia) - home dose statin   COPD (chronic obstructive pulmonary disease) ([place]) - continue home inhalers  All the records are reviewed and case discussed with ED provider. Management plans discussed with the patient and/or family.  DVT PROPHYLAXIS: Systemic anticoagulation  GI PROPHYLAXIS: PPI  ADMISSION STATUS: Inpatient  CODE STATUS: Full Code Status History    Date Active Date Inactive Code Status Order ID Comments User Context   09/03/2016  1:43 AM 09/03/2016  6:46 PM Full Code [id number]  Lance Coon, MD ED   06/25/2016 12:36 AM 06/26/2016  3:35 PM Full Code [id number]  Lance Coon, MD ED   04/03/2016  8:55 PM 04/07/2016  5:59 PM Full Code [id number]  Theodoro Grist, MD  Inpatient   03/26/2016 10:24 PM 03/28/2016  6:00 PM Full Code [id number]  Lance Coon, MD Inpatient   10/07/2015  9:36 AM 10/07/2015  6:30 PM Full Code [id number]  Dionisio Jonte Shiller, MD Inpatient   10/07/2015  8:26 AM 10/07/2015  9:36 AM Full Code [id number]  Dionisio Zoa Dowty, MD Inpatient   10/05/2015  7:29 PM 10/07/2015  8:26 AM Full Code [id number]  Idelle Crouch, MD Inpatient   09/12/2015  9:29 PM 09/14/2015  2:25 PM Full Code [id number]  Demetrios Loll, MD Inpatient   08/01/2015  5:21 AM 08/02/2015  1:46 PM Full Code [id number]  Lance Coon, MD Inpatient   07/12/2015  3:24 PM 07/13/2015  8:17 PM Full Code [id number]  Belva Crome, MD Inpatient   07/11/2015  8:00 PM 07/12/2015  3:24 PM Full Code [id number]  Lonn Georgia, PA-C Inpatient   06/26/2015  5:43 AM 06/27/2015  3:30 PM Full Code [id number]  Juluis Mire, MD Inpatient    Advance Directive Documentation   Flowsheet Row Most Recent Value  Type of Advance Directive  Healthcare Power of Attorney  Pre-existing out of facility DNR order (yellow form or pink MOST form)  No data  "MOST" Form in Place?  No data      TOTAL TIME TAKING CARE OF THIS PATIENT: 45 minutes.    Angad Nabers [place] 10/29/2016, 8:55 PM  [facility]  Office  [phone]  CC: Primary care physician; [name], MD

## 2016-10-29 NOTE — ED Notes (Addendum)
Pt states he has been having cp on and off for a few months and has extensive heart history.  This time the pain started last night. Took 3 nitro at 4pm with relief and then when the pain came back less than an hour later he came in. Pt took 2 baby aspirin at 8am trying to help the pain. Also is on eliquis

## 2016-10-29 NOTE — ED Provider Notes (Signed)
[facility] Emergency Department Provider Note    ____________________________________________   I have reviewed the triage vital signs and the nursing notes.   HISTORY  Chief Complaint Chest Pain   History limited by: Not Limited   HPI Lawrence [name] is a 60 y.o. male who presents to the emergency department today because of chest pain. He states that it started yesterday. It is located in the center and right chest. He states it is a pressure type pain. He says the pain has been constant. He did try taking nitroglycerin at home without any relief. Has had some associated shortness of breath. Has history of coronary artery diseaseand seen by cardiology.   Past Medical History:  Diagnosis Date  . A-fib ([place])   . Anemia   . Anxiety   . Asthma   . CAD (coronary artery disease)    a. 2002 CABGx2 (LIMA->LAD, VG->VG->OM1);  b. 09/2012 DES->OM;  c. 03/2015 PTCA of LAD [facility]) in setting of atretic LIMA; d. 05/2015 Cath [facility]): nonobs dzs; e. 06/2015 Cath ([facility]): LM nl, LAD 45p/d ISR, 50d, D1/2 small, LCX 50p/d ISR, OM1 70ost, 30 ISR, VG->OM1 50ost, 78m LIMA->LAD 99p/d - atretic, RCA dom, nl; f.cath 10/16: 40-50%(FFR 0.90) pLAD, 75% (FFR 0.77) mLAD s/p PCI/DES, oRCA 40% (FFR0.95)  . Celiac disease   . Chronic diastolic CHF (congestive heart failure) ([place]    a. 06/2009 Echo: EF 60-65%, Gr 1 DD, triv AI, mildly dil LA, nl RV.  .Marland KitchenCOPD (chronic obstructive pulmonary disease) ([place]    a. Chronic bronchitis and emphysema.  .Marland KitchenDysrhythmia   . Essential hypertension   . History of tobacco abuse    a. Quit 2014.  .Marland KitchenPSVT (paroxysmal supraventricular tachycardia) ([place]    a. 10/2012 Noted on Zio Patch.  . Type II diabetes mellitus ([facility]     Patient Active Problem List   Diagnosis Date Noted  . Dyspnea 04/03/2016  . Hypotension 04/03/2016  . Chronic kidney disease 04/03/2016  . Anemia 04/03/2016  . Paroxysmal atrial fibrillation ([place] 12/23/2015  . OSA (obstructive  sleep apnea) 12/10/2015  . Left inguinal hernia 11/07/2015  . Anxiety 11/07/2015  . Unstable angina ([place] 10/05/2015  . PAF (paroxysmal atrial fibrillation) ([place] 10/05/2015  . Back pain with left-sided radiculopathy 09/30/2015  . Nocturnal hypoxia 09/06/2015  . BPH (benign prostatic hyperplasia) 08/01/2015  . Chronic diastolic CHF (congestive heart failure) ([place]   . Angina pectoris ([place]   . Chest pain 07/11/2015  . COPD (chronic obstructive pulmonary disease) ([place] 07/03/2015  . CAD (coronary artery disease) 06/26/2015  . HTN (hypertension) 06/26/2015  . DM (diabetes mellitus) ([place] 06/26/2015  . Achalasia 07/24/2014  . GERD (gastroesophageal reflux disease) 06/07/2014  . Former tobacco use 04/11/2013  . HLD (hyperlipidemia) 04/09/2013    Past Surgical History:  Procedure Laterality Date  . BYPASS GRAFT    . CARDIAC CATHETERIZATION N/A 07/12/2015   rocedure: Left Heart Cath and Cors/Grafts Angiography;  Surgeon: HBelva Crome MD;  [facility];  Service: Cardiovascular;  Laterality: N/A;  . CARDIAC CATHETERIZATION Right 10/07/2015   Procedure: Left Heart Cath and Cors/Grafts Angiography;  Surgeon: SDionisio David MD;  [facility];  Service: Cardiovascular;  Laterality: Right;  . CARDIAC CATHETERIZATION N/A 04/06/2016   Procedure: Left Heart Cath and Coronary Angiography;  Surgeon: DYolonda Kida MD;  [facility];  Service: Cardiovascular;  Laterality: N/A;  . CARDIAC CATHETERIZATION  04/06/2016   Procedure: Bypass  Graft Angiography;  Surgeon: Yolonda Kida, MD;  Location: [place] [facility];  Service: Cardiovascular;;  . CHOLECYSTECTOMY    . ESOPHAGEAL DILATION    . TONSILLECTOMY    . VASCULAR SURGERY      Prior to Admission medications   Medication Sig Start Date End Date Taking? Authorizing Provider  albuterol (PROVENTIL HFA;VENTOLIN HFA) 108 (90 Base) MCG/ACT inhaler Inhale 4-6 puffs into the lungs every 4  (four) hours as needed for wheezing or shortness of breath. Reported on 04/08/2016   Yes Historical Provider, MD  allopurinol (ZYLOPRIM) 100 MG tablet THREE (3) TABLETS TWICE DAILY 04/25/16  Yes Birdie Sons, MD  ALPRAZolam Duanne Moron) 1 MG tablet Take 0.5-1 tablets (0.5-1 mg total) by mouth 3 (three) times daily as needed for anxiety. 05/26/16  Yes Birdie Sons, MD  amLODipine (NORVASC) 10 MG tablet Take 10 mg by mouth daily.   Yes Historical Provider, MD  apixaban (ELIQUIS) 5 MG TABS tablet Take 5 mg by mouth 2 (two) times daily.   Yes Historical Provider, MD  aspirin 81 MG EC tablet Take 81 mg by mouth daily. Swallow whole.   Yes Historical Provider, MD  atorvastatin (LIPITOR) 80 MG tablet TAKE 1 TABLET BY MOUTH EVERY NIGHT AT BEDTIME. 05/26/16  Yes Birdie Sons, MD  cetirizine (ZYRTEC) 10 MG tablet TAKE ONE TABLET AT BEDTIME 04/19/16  Yes Birdie Sons, MD  cyclobenzaprine (FLEXERIL) 10 MG tablet TAKE 1 TABLET BY MOUTH EVERY 8 HOURS AS NEEEDED FOR MUSCLE SPASM 07/25/16  Yes Birdie Sons, MD  docusate sodium (COLACE) 100 MG capsule Take 100 mg by mouth 2 (two) times daily.   Yes Historical Provider, MD  ferrous sulfate 325 (65 FE) MG tablet Take 325 mg by mouth daily with breakfast. 08/03/16 08/03/17 Yes Historical Provider, MD  fluticasone furoate-vilanterol (BREO ELLIPTA) 100-25 MCG/INH AEPB Inhale 1 puff into the lungs daily.    Yes Historical Provider, MD  lansoprazole (PREVACID) 30 MG capsule Take 30 mg by mouth 2 (two) times daily.    Yes Historical Provider, MD  magnesium oxide (MAG-OX) 400 (241.3 Mg) MG tablet Take 400 mg by mouth daily.   Yes Historical Provider, MD  metFORMIN (GLUCOPHAGE) 500 MG tablet TAKE 1 TABLET BY MOUTH EVERY MORNING. 10/05/16  Yes Birdie Sons, MD  metoprolol tartrate (LOPRESSOR) 25 MG tablet Take 0.5 tablets (12.5 mg total) by mouth 2 (two) times daily. Patient taking differently: Take 25 mg by mouth daily.  06/26/16  Yes Epifanio Lesches, MD  nitroGLYCERIN  (NITROSTAT) 0.4 MG SL tablet Place 1 tablet (0.4 mg total) under the tongue every 5 (five) minutes as needed for chest pain. Reported on 05/26/2016 10/06/16  Yes Birdie Sons, MD  omega-3 acid ethyl esters (LOVAZA) 1 g capsule TAKE FOUR CAPSULES BY MOUTH DAILY 07/25/16  Yes Birdie Sons, MD  oxyCODONE-acetaminophen (PERCOCET) 10-325 MG tablet Take 1 tablet by mouth every 4 (four) hours as needed. 10/07/16  Yes Birdie Sons, MD  RANEXA 500 MG 12 hr tablet TAKE TWO (2) TABLETS TWICE A DAY 07/25/16  Yes Birdie Sons, MD  sotalol (BETAPACE) 120 MG tablet Take 120 mg by mouth daily.   Yes Historical Provider, MD  sucralfate (CARAFATE) 1 g tablet Take 1 tablet (1 g total) by mouth 2 (two) times daily. 04/22/16  Yes [name], MD  tamsulosin (FLOMAX) 0.4 MG CAPS capsule Take 0.4 mg by mouth daily after breakfast.    Yes Historical Provider, MD  tiotropium (SPIRIVA) 18 MCG inhalation capsule Place 18 mcg into inhaler and inhale daily.   Yes Historical Provider, MD  torsemide (DEMADEX) 100 MG tablet Take 100 mg by mouth daily.   Yes Historical Provider, MD  glucose blood (ACCU-CHEK AVIVA PLUS) test strip Use to check blood sugar once a day 08/21/16   Birdie Sons, MD    Allergies Demerol [meperidine]; Prednisone; Sulfa antibiotics; Albuterol sulfate [albuterol]; and Morphine sulfate  Family History  Problem Relation Age of Onset  . Heart attack Mother   . Depression Mother   . Heart disease Mother   . COPD Mother   . Hypertension Mother   . Heart attack Father   . Diabetes Father   . Depression Father   . Heart disease Father   . Cirrhosis Father   . Parkinson's disease Brother     Social History Social History  Substance Use Topics  . Smoking status: Former Smoker    Quit date: 04/22/2013  . Smokeless tobacco: Never Used  . Alcohol use No     Comment: remotely quit alcohol use. Hx of heavy alcohol use.    Review of Systems  Constitutional: Negative for  fever. Cardiovascular: Negative for chest pain. Respiratory: Negative for shortness of breath. Gastrointestinal: Negative for abdominal pain, vomiting and diarrhea. Genitourinary: Negative for dysuria. Musculoskeletal: Negative for back pain. Skin: Negative for rash. Neurological: Negative for headaches, focal weakness or numbness.  10-point ROS otherwise negative.  ____________________________________________   PHYSICAL EXAM:  VITAL SIGNS: ED Triage Vitals  Enc Vitals Group     BP 10/29/16 1911 (!) 154/88     Pulse Rate 10/29/16 1911 79     Resp 10/29/16 1911 (!) 24     Temp 10/29/16 1911 98.4 F (36.9 C)     Temp Source 10/29/16 1911 Oral     SpO2 10/29/16 1911 93 %     Weight 10/29/16 1908 248 lb (112.5 kg)     Height 10/29/16 1908 5' 7"  (1.702 m)     Head Circumference --      Peak Flow --      Pain Score 10/29/16 1908 9     Pain Loc --      Pain Edu? --      Excl. in [place]? --    Constitutional: Alert and oriented. Well appearing and in no distress. Eyes: Conjunctivae are normal. Normal extraocular movements. ENT   Head: Normocephalic and atraumatic.   Nose: No congestion/rhinnorhea.   Mouth/Throat: Mucous membranes are moist.   Neck: No stridor. Hematological/Lymphatic/Immunilogical: No cervical lymphadenopathy. Cardiovascular: Normal rate, regular rhythm.  No murmurs, rubs, or gallops.  Respiratory: Normal respiratory effort without tachypnea nor retractions. Breath sounds are clear and equal bilaterally. No wheezes/rales/rhonchi. Gastrointestinal: Soft and non tender. No rebound. No guarding.  Genitourinary: Deferred Musculoskeletal: Normal range of motion in all extremities. No lower extremity edema. Neurologic:  Normal speech and language. No gross focal neurologic deficits are appreciated.  Skin:  Skin is warm, dry and intact. No rash noted. Psychiatric: Mood and affect are normal. Speech and behavior are normal. Patient exhibits appropriate  insight and judgment.  ____________________________________________    LABS (pertinent positives/negatives)  Labs Reviewed  BASIC METABOLIC PANEL - Abnormal; Notable for the following:       Result Value   Potassium 3.2 (*)    Chloride 97 (*)    CO2 33 (*)    Glucose, Bld 111 (*)    All other components within normal limits  CBC - Abnormal; Notable for the following:    RDW 18.3 (*)    All other components within normal limits  TROPONIN I - Abnormal; Notable for the following:    Troponin I 0.47 (*)    All other components within normal limits  APTT  PROTIME-INR  HEPARIN LEVEL (UNFRACTIONATED)     ____________________________________________   EKG  I, Nance Pear, attending physician, personally viewed and interpreted this EKG  EKG Time: 1911 Rate: 80 Rhythm: normal sinus rhythm Axis: normal Intervals: qtc 477 QRS: narrow, q waves III ST changes: no st elevation Impression: abnormal ekg   ____________________________________________    RADIOLOGY  CXR IMPRESSION:  Left basilar atelectasis and aortic atherosclerosis. No pulmonary  edema.     ____________________________________________   PROCEDURES  Procedures  CRITICAL CARE Performed by: Nance Pear   Total critical care time: 20 minutes  Critical care time was exclusive of separately billable procedures and treating other patients.  Critical care was necessary to treat or prevent imminent or life-threatening deterioration.  Critical care was time spent personally by me on the following activities: development of treatment plan with patient and/or surrogate as well as nursing, discussions with consultants, evaluation of patient's response to treatment, examination of patient, obtaining history from patient or surrogate, ordering and performing treatments and interventions, ordering and review of laboratory studies, ordering and review of radiographic studies, pulse oximetry and  re-evaluation of patient's condition.  ____________________________________________   INITIAL IMPRESSION / ASSESSMENT AND PLAN / ED COURSE  Pertinent labs & imaging results that were available during my care of the patient were reviewed by me and considered in my medical decision making (see chart for details).  Patient with history of coronary artery disease presents to the emergency department today with concerns for chest pain. Patient's troponin is elevated. Did write for heparin drip however given the patient is on liquids pharmacy recommended deferring for 24 hours. Will give patient aspirin to bring him off 2 full dose. Will plan on admission to the hospital service.  ____________________________________________   FINAL CLINICAL IMPRESSION(S) / ED DIAGNOSES  Final diagnoses:  NSTEMI (non-ST elevated myocardial infarction) ([place])  Chest pain, unspecified type     Note: This dictation was prepared with Dragon dictation. Any transcriptional errors that result from this process are unintentional     Nance Pear, MD 10/29/16 2129

## 2016-10-29 NOTE — ED Notes (Signed)
Spoke pharmacy and dr. Archie Balboa. Pt took his last dose of eliquis at 6pm and took 2 baby asa this am. Pt to have 2 baby asa now and hold heparin for 12 hrs from pt's last dose.

## 2016-10-29 NOTE — ED Notes (Signed)
Pt offered percocet for his pain - states he takes percocet 10's daily for his back pain and that it never works on chest pain.

## 2016-10-29 NOTE — ED Notes (Signed)
Pt states the nitro pills "don't work and just give me a headache". Took one without relief. Dr made aware. Hold nitro for now per doctor.

## 2016-10-29 NOTE — ED Triage Notes (Addendum)
C/o chest pain and left arm pain X 3 days. Worsening today. Pt arrives on 2L [place] that he wears chronically  Pt in NAD.

## 2016-10-29 NOTE — Consult Note (Signed)
ANTICOAGULATION CONSULT NOTE - Initial Consult  Pharmacy Consult for heparin drip Indication: chest pain/ACS  Allergies  Allergen Reactions  . Demerol [Meperidine] Hives  . Prednisone Other (See Comments) and Hypertension    Pt states that this medication puts him in A-fib   . Sulfa Antibiotics Hives  . Albuterol Sulfate [Albuterol] Palpitations and Other (See Comments)    Pt currently uses this medication.    . Morphine Sulfate Nausea And Vomiting, Rash and Other (See Comments)    Pt states that he is only allergic to the tablet form of this medication.      Patient Measurements: Height: 5' 7"  (170.2 cm) Weight: 248 lb (112.5 kg) IBW/kg (Calculated) : 66.1 Heparin Dosing Weight: 91.6kg  Vital Signs: Temp: 98.4 F (36.9 C) (12/14 1911) Temp Source: Oral (12/14 1911) BP: 132/88 (12/14 2130) Pulse Rate: 67 (12/14 2130)  Labs:  Recent Labs  10/27/16 1435 10/29/16 1910 10/29/16 1911  HGB  --  15.7  --   HCT  --  47.2  --   PLT  --  256  --   APTT  --   --  35  LABPROT  --   --  14.2  INR  --   --  1.10  CREATININE 1.02 1.23  --   TROPONINI  --  0.47*  --     Estimated Creatinine Clearance: 74.6 mL/min (by C-G formula based on SCr of 1.23 mg/dL).   Medical History: Past Medical History:  Diagnosis Date  . A-fib ([place])   . Anemia   . Anxiety   . Asthma   . CAD (coronary artery disease)    a. 2002 CABGx2 (LIMA->LAD, VG->VG->OM1);  b. 09/2012 DES->OM;  c. 03/2015 PTCA of LAD [facility]) in setting of atretic LIMA; d. 05/2015 Cath [facility]): nonobs dzs; e. 06/2015 Cath ([facility]): LM nl, LAD 45p/d ISR, 50d, D1/2 small, LCX 50p/d ISR, OM1 70ost, 30 ISR, VG->OM1 50ost, 46m LIMA->LAD 99p/d - atretic, RCA dom, nl; f.cath 10/16: 40-50%(FFR 0.90) pLAD, 75% (FFR 0.77) mLAD s/p PCI/DES, oRCA 40% (FFR0.95)  . Celiac disease   . Chronic diastolic CHF (congestive heart failure) ([place]    a. 06/2009 Echo: EF 60-65%, Gr 1 DD, triv AI, mildly dil LA, nl RV.  .Marland KitchenCOPD (chronic obstructive pulmonary  disease) ([place]    a. Chronic bronchitis and emphysema.  .Marland KitchenDysrhythmia   . Essential hypertension   . History of tobacco abuse    a. Quit 2014.  .Marland KitchenPSVT (paroxysmal supraventricular tachycardia) ([place]De Queen    a. 10/2012 Noted on Zio Patch.  . Type II diabetes mellitus ([place]     Medications:  Scheduled:    Assessment: Pt is a 60year old male with a PMH of CAD and afib who presents with chest pain and elevated troponin. Pt is not currently in Afib. Pt takes eliquis at home. Last dose was at 1800 this evening. Spoke with Dr. GArchie Balboawho said it was ok to hold heparin drip until 12 hours after last apixaban dose. Will start heparin drip at 0600 with no bolus. Baseline HL, APTT, INR and CBC ordered. Will need to dose off of APTT until the two correlate.   Goal of Therapy:  aPTT 66-102 seconds Monitor platelets by anticoagulation protocol: Yes   Plan:  Drip to start at 0600 on 12/15 Start heparin infusion at 1100 units/hr Check anti-Xa level in 6 hours and daily while on heparin Continue to monitor H&H and platelets  Jeronda Don D Indio Santilli, Pharm.D, BCPS  Clinical Pharmacist  10/29/2016,10:08 PM

## 2016-10-30 DIAGNOSIS — I10 Essential (primary) hypertension: Secondary | ICD-10-CM | POA: Diagnosis not present

## 2016-10-30 DIAGNOSIS — R748 Abnormal levels of other serum enzymes: Secondary | ICD-10-CM | POA: Diagnosis not present

## 2016-10-30 DIAGNOSIS — E785 Hyperlipidemia, unspecified: Secondary | ICD-10-CM | POA: Diagnosis not present

## 2016-10-30 DIAGNOSIS — I200 Unstable angina: Secondary | ICD-10-CM | POA: Diagnosis not present

## 2016-10-30 DIAGNOSIS — I25110 Atherosclerotic heart disease of native coronary artery with unstable angina pectoris: Secondary | ICD-10-CM | POA: Diagnosis not present

## 2016-10-30 LAB — BASIC METABOLIC PANEL
Anion gap: 8 (ref 5–15)
BUN: 14 mg/dL (ref 6–20)
CO2: 33 mmol/L — ABNORMAL HIGH (ref 22–32)
Calcium: 9.2 mg/dL (ref 8.9–10.3)
Chloride: 97 mmol/L — ABNORMAL LOW (ref 101–111)
Creatinine, Ser: 1.17 mg/dL (ref 0.61–1.24)
GFR calc Af Amer: >60 mL/min (ref >60)
GFR calc non Af Amer: >60 mL/min (ref >60)
Glucose, Bld: 140 mg/dL — ABNORMAL HIGH (ref 65–99)
Potassium: 3.2 mmol/L — ABNORMAL LOW (ref 3.5–5.1)
Sodium: 138 mmol/L (ref 135–145)

## 2016-10-30 LAB — CBC
HCT: 45.5 % (ref 40.0–52.0)
HCT: 48 % (ref 40.0–52.0)
Hemoglobin: 15 g/dL (ref 13.0–18.0)
Hemoglobin: 15.7 g/dL (ref 13.0–18.0)
MCH: 28.5 pg (ref 26.0–34.0)
MCH: 28.7 pg (ref 26.0–34.0)
MCHC: 33 g/dL (ref 32.0–36.0)
MCHC: 32.7 g/dL (ref 32.0–36.0)
MCV: 86.5 fL (ref 80.0–100.0)
MCV: 87.8 fL (ref 80.0–100.0)
Platelets: 234 10*3/uL (ref 150–440)
Platelets: 233 10*3/uL (ref 150–440)
RBC: 5.25 MIL/uL (ref 4.40–5.90)
RBC: 5.46 MIL/uL (ref 4.40–5.90)
RDW: 19 % — ABNORMAL HIGH (ref 11.5–14.5)
RDW: 18.6 % — ABNORMAL HIGH (ref 11.5–14.5)
WBC: 6.5 10*3/uL (ref 3.8–10.6)
WBC: 6.5 10*3/uL (ref 3.8–10.6)

## 2016-10-30 LAB — APTT: aPTT: 72 seconds — ABNORMAL HIGH (ref 24–36)

## 2016-10-30 LAB — GLUCOSE, CAPILLARY
Glucose-Capillary: 126 mg/dL — ABNORMAL HIGH (ref 65–99)
Glucose-Capillary: 136 mg/dL — ABNORMAL HIGH (ref 65–99)
Glucose-Capillary: 167 mg/dL — ABNORMAL HIGH (ref 65–99)
Glucose-Capillary: 157 mg/dL — ABNORMAL HIGH (ref 65–99)

## 2016-10-30 LAB — FIBRIN DERIVATIVES D-DIMER (ARMC ONLY): Fibrin derivatives D-dimer (ARMC): 279 (ref 0–499)

## 2016-10-30 LAB — HEPARIN LEVEL (UNFRACTIONATED): Heparin Unfractionated: 2.3 IU/mL — ABNORMAL HIGH (ref 0.30–0.70)

## 2016-10-30 LAB — TROPONIN I
Troponin I: 0.29 ng/mL (ref <0.03)
Troponin I: 0.27 ng/mL (ref <0.03)

## 2016-10-30 LAB — MAGNESIUM: Magnesium: 2.3 mg/dL (ref 1.7–2.4)

## 2016-10-30 MED ORDER — ENOXAPARIN SODIUM 40 MG/0.4ML ~~LOC~~ SOLN
40.0000 mg | SUBCUTANEOUS | Status: DC
  Administered 2016-10-30: 40 mg via SUBCUTANEOUS
  Filled 2016-10-30: qty 0.4

## 2016-10-30 MED ORDER — NITROGLYCERIN 2 % TD OINT
1.0000 [in_us] | TOPICAL_OINTMENT | Freq: Four times a day (QID) | TRANSDERMAL | Status: DC
  Administered 2016-10-30 – 2016-11-03 (×16): 1 [in_us] via TOPICAL
  Filled 2016-10-30 (×16): qty 1

## 2016-10-30 MED ORDER — POTASSIUM CHLORIDE CRYS ER 20 MEQ PO TBCR
40.0000 meq | EXTENDED_RELEASE_TABLET | Freq: Once | ORAL | Status: AC
  Administered 2016-10-30: 40 meq via ORAL
  Filled 2016-10-30: qty 2

## 2016-10-30 MED ORDER — HEPARIN (PORCINE) IN NACL 100-0.45 UNIT/ML-% IJ SOLN
1100.0000 [IU]/h | INTRAMUSCULAR | Status: DC
  Administered 2016-10-30: 1100 [IU]/h via INTRAVENOUS
  Filled 2016-10-30: qty 250

## 2016-10-30 MED ORDER — ISOSORBIDE MONONITRATE ER 30 MG PO TB24
30.0000 mg | ORAL_TABLET | Freq: Every day | ORAL | Status: DC
  Administered 2016-10-30 – 2016-10-31 (×2): 30 mg via ORAL
  Filled 2016-10-30 (×2): qty 1

## 2016-10-30 NOTE — Progress Notes (Signed)
Patient went for walk with NT. Stated he does not feel like he can go home tonight, got short of breath, chest pain, and felt weak. Dr. Wendie Chess notified. Planning to keep patient tonight and reassess tomorrow. Order to stop heparin gtt and start 40 subq lovenox tonight instead

## 2016-10-30 NOTE — Care Management Obs Status (Signed)
[place] NOTIFICATION   Patient Details  Name: Lawrence [name] MRN: [id number] Date of Birth: [birth year]   Medicare Observation Status Notification Given:  Yes    Katrina Stack, RN 10/30/2016, 2:10 PM

## 2016-10-30 NOTE — Progress Notes (Signed)
Lawrence [name] at [place] NAME: Lawrence [name]    MR#:  [id number]  DATE OF BIRTH:  08-30-1956  SUBJECTIVE:  CHIEF COMPLAINT:   Chief Complaint  Patient presents with  . Chest Pain   The patient is a 60 year old Caucasian male with past medical history significant history of coronary artery disease, status post cardiac catheterization in May 2017, anemia, atrial fibrillation, who presents to the hospital with complaints of lower sternal area chest pain, radiating to left arm pressure in nature, pain occurs at rest and especially on exertion, accompanied by shortness of breath. He was admitted to the hospital for further evaluation and treatment and he was noted to have mild elevation of troponin. He still complains of some discomfort in the chest, not associated with eating, breathing, but worsening with exertion. He was ablated in the hospital twice with nitroglycerin and without, and still continues to have shortness of breath and chest pains, worsening with exertion. He received Eliquis yesterday in the morning, cardiac catheterization could not have been performed today by cardiologist due to anticoagulation    Review of Systems  Constitutional: Negative for chills, fever and weight loss.  HENT: Negative for congestion.   Eyes: Negative for blurred vision and double vision.  Respiratory: Positive for cough and shortness of breath. Negative for sputum production and wheezing.   Cardiovascular: Positive for chest pain. Negative for palpitations, orthopnea, leg swelling and PND.  Gastrointestinal: Negative for abdominal pain, blood in stool, constipation, diarrhea, nausea and vomiting.  Genitourinary: Negative for dysuria, frequency, hematuria and urgency.  Musculoskeletal: Negative for falls.  Neurological: Negative for dizziness, tremors, focal weakness and headaches.  Endo/Heme/Allergies: Does not bruise/bleed easily.   Psychiatric/Behavioral: Negative for depression. The patient does not have insomnia.     VITAL SIGNS: Blood pressure 124/83, pulse 62, temperature 97.7 F (36.5 C), temperature source Oral, resp. rate 19, height 5' 7"  (1.702 m), weight 108.4 kg (239 lb), SpO2 90 %.  PHYSICAL EXAMINATION:   GENERAL:  60 y.o.-year-old patient lying in the bed in mild to moderate distress, suffering facial expression.  EYES: Pupils equal, round, reactive to light and accommodation. No scleral icterus. Extraocular muscles intact.  HEENT: Head atraumatic, normocephalic. Oropharynx and nasopharynx clear.  NECK:  Supple, no jugular venous distention. No thyroid enlargement, no tenderness.  LUNGS: Normal breath sounds bilaterally, no wheezing, rales,rhonchi or crepitation. No use of accessory muscles of respiration.  CARDIOVASCULAR: S1, S2 normal. No murmurs, rubs, or gallops.  ABDOMEN: Soft, nontender, nondistended. Bowel sounds present. No organomegaly or mass.  EXTREMITIES: No pedal edema, cyanosis, or clubbing.  NEUROLOGIC: Cranial nerves II through XII are intact. Muscle strength 5/5 in all extremities. Sensation intact. Gait not checked.  PSYCHIATRIC: The patient is alert and oriented x 3.  SKIN: No obvious rash, lesion, or ulcer.   ORDERS/RESULTS REVIEWED:   CBC  Recent Labs Lab 10/29/16 1910 10/30/16 0422 10/30/16 1129  WBC 8.4 6.5 6.5  HGB 15.7 15.0 15.7  HCT 47.2 45.5 48.0  PLT 256 234 233  MCV 86.9 86.5 87.8  MCH 29.0 28.5 28.7  MCHC 33.4 33.0 32.7  RDW 18.3* 19.0* 18.6*   ------------------------------------------------------------------------------------------------------------------  Chemistries   Recent Labs Lab 10/27/16 1435 10/29/16 1910 10/30/16 0422 10/30/16 1129  NA 143 139 138  --   K 3.9 3.2* 3.2*  --   CL 98 97* 97*  --   CO2 29 33* 33*  --   GLUCOSE 120* 111* 140*  --  BUN 10 12 14   --   CREATININE 1.02 1.23 1.17  --   CALCIUM 10.0 9.4 9.2  --   MG 2.1  --    --  2.3   ------------------------------------------------------------------------------------------------------------------ estimated creatinine clearance is 76.9 mL/min (by C-G formula based on SCr of 1.17 mg/dL). ------------------------------------------------------------------------------------------------------------------ No results for input(s): TSH, T4TOTAL, T3FREE, THYROIDAB in the last 72 hours.  Invalid input(s): FREET3  Cardiac Enzymes  Recent Labs Lab 10/29/16 2249 10/30/16 0422 10/30/16 1129  TROPONINI 0.38* 0.29* 0.27*   ------------------------------------------------------------------------------------------------------------------ Invalid input(s): POCBNP ---------------------------------------------------------------------------------------------------------------  RADIOLOGY: Dg Chest 2 View  Result Date: 10/29/2016 CLINICAL DATA:  Chest pain EXAM: CHEST  2 VIEW COMPARISON:  Chest radiograph 10/03/2016 FINDINGS: There are median sternotomy wires. There is calcification within the aortic arch. There is left basilar atelectasis. No pleural effusion or pneumothorax. No pulmonary edema. IMPRESSION: Left basilar atelectasis and aortic atherosclerosis. No pulmonary edema. Electronically Signed   By: Ulyses Jarred M.D.   On: 10/29/2016 19:25    EKG:  Orders placed or performed during the hospital encounter of 10/29/16  . EKG 12-Lead  . EKG 12-Lead    ASSESSMENT AND PLAN:  Principal Problem:   Unstable angina (HCC) Active Problems:   CAD (coronary artery disease)   HTN (hypertension)   DM (diabetes mellitus) (HCC)   GERD (gastroesophageal reflux disease)   HLD (hyperlipidemia)   COPD (chronic obstructive pulmonary disease) (HCC)   Chronic diastolic CHF (congestive heart failure) (HCC)   PAF (paroxysmal atrial fibrillation) (HCC)   Anxiety   OSA (obstructive sleep apnea)  #1. Chest pain, elevated troponin, worsening pain on exertion, concerning for  unstable angina, cardiology consultation is requested, continue patient on Lovenox, metoprolol, aspirin, may need to have repeated cardiac catheterization versus outpatient stress test if symptoms improve or with conservative therapy, discussed his cardiologist, Dr. Clayborn Bigness #2. Essential hypertension, well-controlled on current therapy, Imdur is added #3. Hyperlipidemia, continue Lipitor at 80 mg daily dose, check lipid panel in the morning #4. COPD, continue outpatient medications, oxygen, O2 sats are in 90s and 2-1/2 L, chest x-ray revealed left basilar atelectasis. Get  d-dimer, CTA of chest, if d-dimer is high #5. Hypokalemia, supplemented orally, magnesium level was normal  Management plans discussed with the patient, family and they are in agreement.   DRUG ALLERGIES:  Allergies  Allergen Reactions  . Demerol [Meperidine] Hives  . Prednisone Other (See Comments) and Hypertension    Pt states that this medication puts him in A-fib   . Sulfa Antibiotics Hives  . Albuterol Sulfate [Albuterol] Palpitations and Other (See Comments)    Pt currently uses this medication.    . Morphine Sulfate Nausea And Vomiting, Rash and Other (See Comments)    Pt states that he is only allergic to the tablet form of this medication.      CODE STATUS:     Code Status Orders        Start     Ordered   10/29/16 2222  Full code  Continuous     10/29/16 2222    Code Status History    Date Active Date Inactive Code Status Order ID Comments User Context   09/03/2016  1:43 AM 09/03/2016  6:46 PM Full Code [id number]  Lance Coon, MD ED   06/25/2016 12:36 AM 06/26/2016  3:35 PM Full Code [id number]  Lance Coon, MD ED   04/03/2016  8:55 PM 04/07/2016  5:59 PM Full Code [id number]  Theodoro Grist, MD Inpatient   03/26/2016 10:24  PM 03/28/2016  6:00 PM Full Code [id number]  Lance Coon, MD Inpatient   10/07/2015  9:36 AM 10/07/2015  6:30 PM Full Code [id number]  Dionisio David, MD Inpatient   10/07/2015  8:26  AM 10/07/2015  9:36 AM Full Code [id number]  Dionisio David, MD Inpatient   10/05/2015  7:29 PM 10/07/2015  8:26 AM Full Code [id number]  Idelle Crouch, MD Inpatient   09/12/2015  9:29 PM 09/14/2015  2:25 PM Full Code [id number]  Demetrios Loll, MD Inpatient   08/01/2015  5:21 AM 08/02/2015  1:46 PM Full Code [id number]  Lance Coon, MD Inpatient   07/12/2015  3:24 PM 07/13/2015  8:17 PM Full Code [id number]  Belva Crome, MD Inpatient   07/11/2015  8:00 PM 07/12/2015  3:24 PM Full Code [id number]  Lonn Georgia, PA-C Inpatient   06/26/2015  5:43 AM 06/27/2015  3:30 PM Full Code [id number]  Juluis Mire, MD Inpatient    Advance Directive Documentation   Flowsheet Row Most Recent Value  Type of Advance Directive  Healthcare Power of Attorney  Pre-existing out of facility DNR order (yellow form or pink MOST form)  No data  "MOST" Form in Place?  No data      TOTAL TIME TAKING CARE OF THIS PATIENT: 40 minutes.    Theodoro Grist M.D on 10/30/2016 at 5:40 PM  Between 7am to 6pm - Pager - [phone]  After 6pm go to [email or web address] - password [email or web address]  Office  [phone]  CC: Primary care physician; [name], MD

## 2016-10-30 NOTE — Consult Note (Signed)
ANTICOAGULATION CONSULT NOTE - Initial Consult  Pharmacy Consult for heparin drip Indication: chest pain/ACS  Allergies  Allergen Reactions  . Demerol [Meperidine] Hives  . Prednisone Other (See Comments) and Hypertension    Pt states that this medication puts him in A-fib   . Sulfa Antibiotics Hives  . Albuterol Sulfate [Albuterol] Palpitations and Other (See Comments)    Pt currently uses this medication.    . Morphine Sulfate Nausea And Vomiting, Rash and Other (See Comments)    Pt states that he is only allergic to the tablet form of this medication.      Patient Measurements: Height: 5' 7"  (170.2 cm) Weight: 239 lb (108.4 kg) IBW/kg (Calculated) : 66.1 Heparin Dosing Weight: 91.6kg  Vital Signs: Temp: 97.7 F (36.5 C) (12/15 1130) Temp Source: Oral (12/15 1130) BP: 124/83 (12/15 1130) Pulse Rate: 62 (12/15 1130)  Labs:  Recent Labs  10/27/16 1435  10/29/16 1910 10/29/16 1911 10/29/16 2249 10/30/16 0422 10/30/16 1129  HGB  --   < > 15.7  --   --  15.0 15.7  HCT  --   --  47.2  --   --  45.5 48.0  PLT  --   --  256  --   --  234 233  APTT  --   --   --  35  --   --  72*  LABPROT  --   --   --  14.2  --   --   --   INR  --   --   --  1.10  --   --   --   CREATININE 1.02  --  1.23  --   --  1.17  --   TROPONINI  --   < > 0.47*  --  0.38* 0.29* 0.27*  < > = values in this interval not displayed.  Estimated Creatinine Clearance: 76.9 mL/min (by C-G formula based on SCr of 1.17 mg/dL).   Medical History: Past Medical History:  Diagnosis Date  . A-fib ([place])   . Anemia   . Anxiety   . Asthma   . CAD (coronary artery disease)    a. 2002 CABGx2 (LIMA->LAD, VG->VG->OM1);  b. 09/2012 DES->OM;  c. 03/2015 PTCA of LAD [facility]) in setting of atretic LIMA; d. 05/2015 Cath [facility]): nonobs dzs; e. 06/2015 Cath ([facility]): LM nl, LAD 45p/d ISR, 50d, D1/2 small, LCX 50p/d ISR, OM1 70ost, 30 ISR, VG->OM1 50ost, 50m LIMA->LAD 99p/d - atretic, RCA dom, nl; f.cath 10/16: 40-50%(FFR  0.90) pLAD, 75% (FFR 0.77) mLAD s/p PCI/DES, oRCA 40% (FFR0.95)  . Celiac disease   . Chronic diastolic CHF (congestive heart failure) ([facility]    a. 06/2009 Echo: EF 60-65%, Gr 1 DD, triv AI, mildly dil LA, nl RV.  .Marland KitchenCOPD (chronic obstructive pulmonary disease) ([place]    a. Chronic bronchitis and emphysema.  .Marland KitchenDysrhythmia   . Essential hypertension   . History of tobacco abuse    a. Quit 2014.  .Marland KitchenPSVT (paroxysmal supraventricular tachycardia) ([place]    a. 10/2012 Noted on [facility].  . Type II diabetes mellitus (HCC)     Medications:  Scheduled:  . amLODipine  10 mg Oral Daily  . aspirin EC  81 mg Oral Daily  . atorvastatin  80 mg Oral QHS  . fluticasone furoate-vilanterol  1 puff Inhalation Daily  . insulin aspart  0-5 Units Subcutaneous QHS  . insulin aspart  0-9 Units Subcutaneous TID WC  . metoprolol tartrate  12.5 mg Oral BID  . pantoprazole  40 mg Oral BID  . ranolazine  500 mg Oral BID  . sotalol  120 mg Oral Daily  . sucralfate  1 g Oral BID  . tamsulosin  0.4 mg Oral QPC breakfast  . tiotropium  18 mcg Inhalation Daily  . torsemide  100 mg Oral Daily    Assessment: Pt is a 60 year old male with a PMH of CAD and afib who presents with chest pain and elevated troponin. Pt is not currently in Afib. Pt takes eliquis at home. Will need to dose off of APTT until the two correlate.   Goal of Therapy:  HL 0.3-0.7 aPTT 66-102 seconds Monitor platelets by anticoagulation protocol: Yes   Plan:  Heparin level remains elevated post apixaban but aPTT is within range. Will continue heparin infusion at 1100 units/hr and recheck aPTT in 6 hours. Next HL in [place], PharmD Clinical Pharmacist  10/30/2016,12:55 PM

## 2016-10-30 NOTE — Progress Notes (Signed)
Attempted to walk patient around the nursing station.  He was unable to complete 1 lap before he started complaining of chest pain/tightness and had to sit down.  He was put in a chair and wheeled back to his room.  No changes on the monitor, VSS.  Dr. Ether Griffins aware.

## 2016-10-30 NOTE — Progress Notes (Signed)
Report from handoff. Patient resting quietly, no distress. Heparin gtt verified and documented. Will continue to monitor.

## 2016-10-30 NOTE — Consult Note (Signed)
Reason for Consult: Unstable angina coronary disease Referring Physician: [name] primary, hospitalist Dr. Lance Coon Dr. [name] cardiologist  Lawrence [name] is an 59 y.o. male.  HPI: Patient 60 year old with known coronary disease unstable angina chronic chest pain history CABG. Patient is a former smoker is on disability because of chronic low back pain has a significant history of obstructive sleep apnea. Patient complains of significant shortness of breath and chest discomfort at rest. Patient has a history of atrial fibrillation currently on Betapace and Lopressor for rate control anticoagulation with Eliquis. Patient denies any blackout spells or syncope has had chronic persistent recurrent chest pain has been treated with Ranexa currently not on Imdur. Patient last saw [name] a few weeks ago scheduler same in 6 months.  Past Medical History:  Diagnosis Date  . A-fib ([place])   . Anemia   . Anxiety   . Asthma   . CAD (coronary artery disease)    a. 2002 CABGx2 (LIMA->LAD, VG->VG->OM1);  b. 09/2012 DES->OM;  c. 03/2015 PTCA of LAD [facility]) in setting of atretic LIMA; d. 05/2015 Cath [facility]): nonobs dzs; e. 06/2015 Cath ([facility]): LM nl, LAD 45p/d ISR, 50d, D1/2 small, LCX 50p/d ISR, OM1 70ost, 30 ISR, VG->OM1 50ost, 28m LIMA->LAD 99p/d - atretic, RCA dom, nl; f.cath 10/16: 40-50%(FFR 0.90) pLAD, 75% (FFR 0.77) mLAD s/p PCI/DES, oRCA 40% (FFR0.95)  . Celiac disease   . Chronic diastolic CHF (congestive heart failure) ([place]    a. 06/2009 Echo: EF 60-65%, Gr 1 DD, triv AI, mildly dil LA, nl RV.  .Marland KitchenCOPD (chronic obstructive pulmonary disease) ([place]    a. Chronic bronchitis and emphysema.  .Marland KitchenDysrhythmia   . Essential hypertension   . History of tobacco abuse    a. Quit 2014.  .Marland KitchenPSVT (paroxysmal supraventricular tachycardia) ([place]    a. 10/2012 Noted on [facility].  . Type II diabetes mellitus ([place]     Past Surgical History:  Procedure Laterality Date  . BYPASS GRAFT    . CARDIAC  CATHETERIZATION N/A 07/12/2015   rocedure: Left Heart Cath and Cors/Grafts Angiography;  Surgeon: HBelva Crome MD;  [facility];  Service: Cardiovascular;  Laterality: N/A;  . CARDIAC CATHETERIZATION Right 10/07/2015   Procedure: Left Heart Cath and Cors/Grafts Angiography;  Surgeon: SDionisio David MD;  [facility];  Service: Cardiovascular;  Laterality: Right;  . CARDIAC CATHETERIZATION N/A 04/06/2016   Procedure: Left Heart Cath and Coronary Angiography;  Surgeon: DYolonda Kida MD;  [facility];  Service: Cardiovascular;  Laterality: N/A;  . CARDIAC CATHETERIZATION  04/06/2016   Procedure: Bypass Graft Angiography;  Surgeon: DYolonda Kida MD;  [facility];  Service: Cardiovascular;;  . CHOLECYSTECTOMY    . ESOPHAGEAL DILATION    . TONSILLECTOMY    . VASCULAR SURGERY      Family History  Problem Relation Age of Onset  . Heart attack Mother   . Depression Mother   . Heart disease Mother   . COPD Mother   . Hypertension Mother   . Heart attack Father   . Diabetes Father   . Depression Father   . Heart disease Father   . Cirrhosis Father   . Parkinson's disease Brother     Social History:  reports that he quit smoking about 3 years ago. He has never used smokeless tobacco. He reports that he does not drink alcohol or use drugs.  Allergies:  Allergies  Allergen Reactions  . Demerol [Meperidine] Hives  . Prednisone Other (See Comments) and Hypertension    Pt states that this medication puts him in A-fib   . Sulfa Antibiotics Hives  . Albuterol Sulfate [Albuterol] Palpitations and Other (See Comments)    Pt currently uses this medication.    . Morphine Sulfate Nausea And Vomiting, Rash and Other (See Comments)    Pt states that he is only allergic to the tablet form of this medication.      Medications: I have reviewed the patient's current medications.  Results for orders placed or performed  during the hospital encounter of 10/29/16 (from the past 48 hour(s))  Basic metabolic panel     Status: Abnormal   Collection Time: 10/29/16  7:10 PM  Result Value Ref Range   Sodium 139 135 - 145 mmol/L   Potassium 3.2 (L) 3.5 - 5.1 mmol/L   Chloride 97 (L) 101 - 111 mmol/L   CO2 33 (H) 22 - 32 mmol/L   Glucose, Bld 111 (H) 65 - 99 mg/dL   BUN 12 6 - 20 mg/dL   Creatinine, Ser 1.23 0.61 - 1.24 mg/dL   Calcium 9.4 8.9 - 10.3 mg/dL   GFR calc non Af Amer >60 >60 mL/min   GFR calc Af Amer >60 >60 mL/min    Comment: (NOTE) The eGFR has been calculated using the CKD EPI equation. This calculation has not been validated in all clinical situations. eGFR's persistently <60 mL/min signify possible Chronic Kidney Disease.    Anion gap 9 5 - 15  CBC     Status: Abnormal   Collection Time: 10/29/16  7:10 PM  Result Value Ref Range   WBC 8.4 3.8 - 10.6 K/uL   RBC 5.43 4.40 - 5.90 MIL/uL   Hemoglobin 15.7 13.0 - 18.0 g/dL   HCT 47.2 40.0 - 52.0 %   MCV 86.9 80.0 - 100.0 fL   MCH 29.0 26.0 - 34.0 pg   MCHC 33.4 32.0 - 36.0 g/dL   RDW 18.3 (H) 11.5 - 14.5 %   Platelets 256 150 - 440 K/uL  Troponin I     Status: Abnormal   Collection Time: 10/29/16  7:10 PM  Result Value Ref Range   Troponin I 0.47 (HH) <0.03 ng/mL    Comment: CRITICAL RESULT CALLED TO, READ BACK BY AND VERIFIED WITH ANDREA BRYANT AT 1952 10/29/2016 BY TFK.   APTT     Status: None   Collection Time: 10/29/16  7:11 PM  Result Value Ref Range   aPTT 35 24 - 36 seconds  Protime-INR     Status: None   Collection Time: 10/29/16  7:11 PM  Result Value Ref Range   Prothrombin Time 14.2 11.4 - 15.2 seconds   INR 1.10   Glucose, capillary     Status: Abnormal   Collection Time: 10/29/16 10:29 PM  Result Value Ref Range   Glucose-Capillary 124 (H) 65 - 99 mg/dL  Troponin I     Status: Abnormal   Collection Time: 10/29/16 10:49 PM  Result Value Ref Range   Troponin I 0.38 (HH) <0.03 ng/mL    Comment: CRITICAL VALUE  NOTED. VALUE IS CONSISTENT WITH PREVIOUSLY REPORTED/CALLED VALUE SNJ  Troponin I     Status: Abnormal   Collection Time: 10/30/16  4:22 AM  Result Value Ref Range   Troponin I 0.29 (HH) <0.03 ng/mL    Comment: CRITICAL VALUE NOTED. VALUE IS CONSISTENT WITH PREVIOUSLY REPORTED/CALLED VALUE SNJ  Basic metabolic panel     Status: Abnormal   Collection Time: 10/30/16  4:22 AM  Result Value Ref Range   Sodium 138 135 - 145 mmol/L   Potassium 3.2 (L) 3.5 - 5.1 mmol/L   Chloride 97 (L) 101 - 111 mmol/L   CO2 33 (H) 22 - 32 mmol/L   Glucose, Bld 140 (H) 65 - 99 mg/dL   BUN 14 6 - 20 mg/dL   Creatinine, Ser 1.17 0.61 - 1.24 mg/dL   Calcium 9.2 8.9 - 10.3 mg/dL   GFR calc non Af Amer >60 >60 mL/min   GFR calc Af Amer >60 >60 mL/min    Comment: (NOTE) The eGFR has been calculated using the CKD EPI equation. This calculation has not been validated in all clinical situations. eGFR's persistently <60 mL/min signify possible Chronic Kidney Disease.    Anion gap 8 5 - 15  CBC     Status: Abnormal   Collection Time: 10/30/16  4:22 AM  Result Value Ref Range   WBC 6.5 3.8 - 10.6 K/uL   RBC 5.25 4.40 - 5.90 MIL/uL   Hemoglobin 15.0 13.0 - 18.0 g/dL   HCT 45.5 40.0 - 52.0 %   MCV 86.5 80.0 - 100.0 fL   MCH 28.5 26.0 - 34.0 pg   MCHC 33.0 32.0 - 36.0 g/dL   RDW 19.0 (H) 11.5 - 14.5 %   Platelets 234 150 - 440 K/uL  Glucose, capillary     Status: Abnormal   Collection Time: 10/30/16  7:34 AM  Result Value Ref Range   Glucose-Capillary 126 (H) 65 - 99 mg/dL  Troponin I     Status: Abnormal   Collection Time: 10/30/16 11:29 AM  Result Value Ref Range   Troponin I 0.27 (HH) <0.03 ng/mL    Comment: CRITICAL VALUE NOTED. VALUE IS CONSISTENT WITH PREVIOUSLY REPORTED/CALLED VALUE. SGD  APTT     Status: Abnormal   Collection Time: 10/30/16 11:29 AM  Result Value Ref Range   aPTT 72 (H) 24 - 36 seconds    Comment:        IF BASELINE aPTT IS ELEVATED, SUGGEST PATIENT RISK ASSESSMENT BE USED TO  DETERMINE APPROPRIATE ANTICOAGULANT THERAPY.   CBC     Status: Abnormal   Collection Time: 10/30/16 11:29 AM  Result Value Ref Range   WBC 6.5 3.8 - 10.6 K/uL   RBC 5.46 4.40 - 5.90 MIL/uL   Hemoglobin 15.7 13.0 - 18.0 g/dL   HCT 48.0 40.0 - 52.0 %   MCV 87.8 80.0 - 100.0 fL   MCH 28.7 26.0 - 34.0 pg   MCHC 32.7 32.0 - 36.0 g/dL   RDW 18.6 (H) 11.5 - 14.5 %   Platelets 233 150 - 440 K/uL  Glucose, capillary     Status: Abnormal   Collection Time: 10/30/16 11:32 AM  Result Value Ref Range   Glucose-Capillary 136 (H) 65 - 99 mg/dL    Dg Chest 2 View  Result Date: 10/29/2016 CLINICAL DATA:  Chest pain EXAM: CHEST  2 VIEW COMPARISON:  Chest radiograph 10/03/2016 FINDINGS: There are median sternotomy wires. There is calcification within the aortic arch. There is left basilar atelectasis. No pleural effusion or pneumothorax. No pulmonary edema. IMPRESSION: Left basilar atelectasis and aortic atherosclerosis. No pulmonary edema. Electronically Signed   By: Ulyses Jarred M.D.   On: 10/29/2016 19:25    Review of Systems  Constitutional: Positive for malaise/fatigue.  HENT: Negative.   Eyes: Negative.  Respiratory: Positive for shortness of breath.   Cardiovascular: Positive for chest pain, palpitations, leg swelling and PND.  Gastrointestinal: Negative.   Genitourinary: Negative.   Musculoskeletal: Positive for back pain.  Skin: Negative.   Neurological: Positive for weakness.  Endo/Heme/Allergies: Negative.   Psychiatric/Behavioral: Negative.    Blood pressure 124/83, pulse 62, temperature 97.7 F (36.5 C), temperature source Oral, resp. rate 19, height _0  (1.702 m), weight 108.4 kg (239 lb), SpO2 90 %. Physical Exam  Nursing note and vitals reviewed. Constitutional: He is oriented to person, place, and time. He appears well-developed and well-nourished.  HENT:  Head: Normocephalic and atraumatic.  Eyes: Conjunctivae and EOM are normal. Pupils are equal, round, and  reactive to light.  Neck: Normal range of motion. Neck supple.  Cardiovascular: Normal rate and regular rhythm.  Exam reveals gallop.   Murmur heard. Respiratory: Effort normal and breath sounds normal.  GI: Soft. Bowel sounds are normal.  Musculoskeletal: Normal range of motion.  Neurological: He is alert and oriented to person, place, and time. He has normal reflexes.  Skin: Skin is warm and dry.  Psychiatric: He has a normal mood and affect.    Assessment/Plan: Angina Coronary disease Coronary bypass surgery Atrial fibrillation Anemia Asthma Obesity Elevated troponin Fromer smoker Hyperlipidemia Chronic back pain Diastolic congestive heart failure Obstructive sleep apnea COPD Hypokalemia . Plan Agree with admit to telemetry Follow-up troponins and EKGs Short-term anticoagulation with heparin Continue Lipitor therapy for lipid management Diabetes management continue sliding scale insulin Continue anticoagulation for atrial fibrillation rate control Continue pain control for angina including Imdur and Ranexa advanced Imdur to 60 mg twice a day Continue rate control with metoprolol rhythm control with Betapace Inhalers for COPD Do not recommend repeat cardiac catheter at this point he had procedure done in May 2017 with moderate to severe coronary disease treated medically Chronic low back pain continue narcotic therapy We'll attempt to treat the patient medically if symptoms do not improve 4 troponin elevates higher would consider repeat cardiac cath with intention to treat symptomatic vessel Corrected electrolytes including potassium Consider invasive strategy of the patient has been off of Eliquis for at least 36-48 hours   Dwayne D Callwood 10/30/2016, 1:21 PM

## 2016-10-30 NOTE — Care Management (Signed)
Presents from home with chest pain.  Placed in observation.  Elevated troponin.  Known CAD.  Chronic home 02 through [place].  Says a nurse comes to see him through his insurance.  made thn referral

## 2016-10-31 DIAGNOSIS — E785 Hyperlipidemia, unspecified: Secondary | ICD-10-CM | POA: Diagnosis not present

## 2016-10-31 DIAGNOSIS — R748 Abnormal levels of other serum enzymes: Secondary | ICD-10-CM | POA: Diagnosis not present

## 2016-10-31 DIAGNOSIS — I200 Unstable angina: Secondary | ICD-10-CM | POA: Diagnosis not present

## 2016-10-31 DIAGNOSIS — I10 Essential (primary) hypertension: Secondary | ICD-10-CM | POA: Diagnosis not present

## 2016-10-31 DIAGNOSIS — I25110 Atherosclerotic heart disease of native coronary artery with unstable angina pectoris: Secondary | ICD-10-CM | POA: Diagnosis not present

## 2016-10-31 LAB — GLUCOSE, CAPILLARY
Glucose-Capillary: 160 mg/dL — ABNORMAL HIGH (ref 65–99)
Glucose-Capillary: 136 mg/dL — ABNORMAL HIGH (ref 65–99)
Glucose-Capillary: 127 mg/dL — ABNORMAL HIGH (ref 65–99)
Glucose-Capillary: 138 mg/dL — ABNORMAL HIGH (ref 65–99)

## 2016-10-31 LAB — LIPID PANEL
Cholesterol: 157 mg/dL (ref 0–200)
HDL: 30 mg/dL — ABNORMAL LOW (ref >40)
LDL Cholesterol: UNDETERMINED mg/dL (ref 0–99)
Total CHOL/HDL Ratio: 5.2 RATIO
Triglycerides: 424 mg/dL — ABNORMAL HIGH (ref <150)
VLDL: UNDETERMINED mg/dL (ref 0–40)

## 2016-10-31 LAB — CBC
HCT: 47.3 % (ref 40.0–52.0)
Hemoglobin: 15.8 g/dL (ref 13.0–18.0)
MCH: 29.1 pg (ref 26.0–34.0)
MCHC: 33.4 g/dL (ref 32.0–36.0)
MCV: 87 fL (ref 80.0–100.0)
Platelets: 235 10*3/uL (ref 150–440)
RBC: 5.44 MIL/uL (ref 4.40–5.90)
RDW: 18.7 % — ABNORMAL HIGH (ref 11.5–14.5)
WBC: 6.9 10*3/uL (ref 3.8–10.6)

## 2016-10-31 MED ORDER — ENOXAPARIN SODIUM 40 MG/0.4ML ~~LOC~~ SOLN
40.0000 mg | SUBCUTANEOUS | Status: DC
  Administered 2016-10-31 – 2016-11-02 (×3): 40 mg via SUBCUTANEOUS
  Filled 2016-10-31 (×3): qty 0.4

## 2016-10-31 MED ORDER — ISOSORBIDE MONONITRATE ER 60 MG PO TB24
60.0000 mg | ORAL_TABLET | Freq: Every day | ORAL | Status: DC
  Administered 2016-11-01 – 2016-11-03 (×3): 60 mg via ORAL
  Filled 2016-10-31 (×3): qty 1

## 2016-10-31 MED ORDER — RANOLAZINE ER 500 MG PO TB12
1000.0000 mg | ORAL_TABLET | Freq: Two times a day (BID) | ORAL | Status: DC
  Administered 2016-10-31 – 2016-11-03 (×5): 1000 mg via ORAL
  Filled 2016-10-31 (×5): qty 2

## 2016-10-31 NOTE — Progress Notes (Signed)
[facility] NAME: Lawrence [name]    MR#:  [id number]  DATE OF BIRTH:  05/26/1956  SUBJECTIVE:   Patient is here due to chest pain. Cardiac markers a mildly elevated. Continues to have chest pain even at rest. Discussed with cardiology today and since he continues to have ongoing symptoms plan is for cardiac catheterization on Monday morning.  REVIEW OF SYSTEMS:    Review of Systems  Constitutional: Negative for chills and fever.  HENT: Negative for congestion and tinnitus.   Eyes: Negative for blurred vision and double vision.  Respiratory: Negative for cough, shortness of breath and wheezing.   Cardiovascular: Positive for chest pain. Negative for orthopnea and PND.  Gastrointestinal: Negative for abdominal pain, diarrhea, nausea and vomiting.  Genitourinary: Negative for dysuria and hematuria.  Neurological: Negative for dizziness, sensory change and focal weakness.  All other systems reviewed and are negative.   Nutrition: Cardiac Tolerating Diet: yes Tolerating PT: Ambulatory   DRUG ALLERGIES:   Allergies  Allergen Reactions  . Demerol [Meperidine] Hives  . Prednisone Other (See Comments) and Hypertension    Pt states that this medication puts him in A-fib   . Sulfa Antibiotics Hives  . Albuterol Sulfate [Albuterol] Palpitations and Other (See Comments)    Pt currently uses this medication.    . Morphine Sulfate Nausea And Vomiting, Rash and Other (See Comments)    Pt states that he is only allergic to the tablet form of this medication.      VITALS:  Blood pressure 128/83, pulse 66, temperature 98 F (36.7 C), temperature source Oral, resp. rate 18, height 5' 7"  (1.702 m), weight 105.5 kg (232 lb 9.6 oz), SpO2 92 %.  PHYSICAL EXAMINATION:   Physical Exam  GENERAL:  60 y.o.-year-old obese patient lying in the bed in no acute distress.  EYES: Pupils equal, round, reactive to light and accommodation. No scleral  icterus. Extraocular muscles intact.  HEENT: Head atraumatic, normocephalic. Oropharynx and nasopharynx clear.  NECK:  Supple, no jugular venous distention. No thyroid enlargement, no tenderness.  LUNGS: Normal breath sounds bilaterally, no wheezing, rales, rhonchi. No use of accessory muscles of respiration.  CARDIOVASCULAR: S1, S2 normal. No murmurs, rubs, or gallops.  ABDOMEN: Soft, nontender, nondistended. Bowel sounds present. No organomegaly or mass.  EXTREMITIES: No cyanosis, clubbing or edema b/l.    NEUROLOGIC: Cranial nerves II through XII are intact. No focal Motor or sensory deficits b/l.   PSYCHIATRIC: The patient is alert and oriented x 3.  SKIN: No obvious rash, lesion, or ulcer.    LABORATORY PANEL:   CBC  Recent Labs Lab 10/31/16 0541  WBC 6.9  HGB 15.8  HCT 47.3  PLT 235   ------------------------------------------------------------------------------------------------------------------  Chemistries   Recent Labs Lab 10/30/16 0422 10/30/16 1129  NA 138  --   K 3.2*  --   CL 97*  --   CO2 33*  --   GLUCOSE 140*  --   BUN 14  --   CREATININE 1.17  --   CALCIUM 9.2  --   MG  --  2.3   ------------------------------------------------------------------------------------------------------------------  Cardiac Enzymes  Recent Labs Lab 10/30/16 1129  TROPONINI 0.27*   ------------------------------------------------------------------------------------------------------------------  RADIOLOGY:  Dg Chest 2 View  Result Date: 10/29/2016 CLINICAL DATA:  Chest pain EXAM: CHEST  2 VIEW COMPARISON:  Chest radiograph 10/03/2016 FINDINGS: There are median sternotomy wires. There is calcification within the aortic arch. There is left basilar atelectasis.  No pleural effusion or pneumothorax. No pulmonary edema. IMPRESSION: Left basilar atelectasis and aortic atherosclerosis. No pulmonary edema. Electronically Signed   By: Ulyses Jarred M.D.   On: 10/29/2016  19:25     ASSESSMENT AND PLAN:   60 year old male with past medical history of coronary artery disease status post bypass, previous history of MI, chronic chest pain, COPD, anxiety, history of atrial fibrillation, who presented to hospital due to chest pain.   1. Chest Pain - due to chronic angina. Patient does have significant history of coronary disease and previous history of bypass. -Troponins are mildly elevated but patient continues to have chest pain even at rest.  -Patient has had a previous cardiac catheterization with extensive disease which was managed medically previously. I will adjust patient's medications by increasing Ranexa increasing Imdur. Plan for cardiac catheterization on Monday if symptoms are not improved.  2. Anxiety-continue Xanax.  3. Essential hypertension-continue Norvasc, metoprolol, Imdur.  4. History of chronic atrial fibrillation-continue sotalol, converted to sinus rhythm. -Eliquis is on hold as patient to get cardiac catheterization on Monday.   5. BPH-continue Flomax. Next  6. History of COPD-no acute exacerbation-continue Spiriva, Breo-Ellipta  7. Hx of CHF - clinically pt. Is not in CHF. Chronic Diastolic - cont. Torsemide, Metoprolol.    All the records are reviewed and case discussed with Care Management/Social Worker. Management plans discussed with the patient, family and they are in agreement.  CODE STATUS: Full code  DVT Prophylaxis: Eliquis  TOTAL TIME TAKING CARE OF THIS PATIENT: 30 minutes.   POSSIBLE D/C IN 1-2 DAYS, DEPENDING ON CLINICAL CONDITION.   Henreitta Leber M.D on 10/31/2016 at 3:18 PM  Between 7am to 6pm - Pager - [phone]  After 6pm go to [email or web address] - Proofreader  [facility]  Office  [phone]  CC: Primary care physician; [name], MD

## 2016-10-31 NOTE — Progress Notes (Signed)
Converted from Atrial Fib to Sinus rhythm.

## 2016-10-31 NOTE — Progress Notes (Signed)
Patient c/o funny taste/smell while using auto cpap last night. Unit changed out. Mask circuit filter changed as well.

## 2016-10-31 NOTE — Progress Notes (Signed)
Remains in sinus rhythm, rate 60 -80.  Requests pain medicine every 2 hours alternates between Percocet and Morphine.  States he takes Percocet for his chronic back pain from disc disease.  Takes Morphine for his chronic chest pain.  His pain score has be consistently and 8 prior to medication and a 6 following the medication.

## 2016-11-01 DIAGNOSIS — I10 Essential (primary) hypertension: Secondary | ICD-10-CM | POA: Diagnosis not present

## 2016-11-01 DIAGNOSIS — I25110 Atherosclerotic heart disease of native coronary artery with unstable angina pectoris: Secondary | ICD-10-CM | POA: Diagnosis not present

## 2016-11-01 DIAGNOSIS — E785 Hyperlipidemia, unspecified: Secondary | ICD-10-CM | POA: Diagnosis not present

## 2016-11-01 DIAGNOSIS — I200 Unstable angina: Secondary | ICD-10-CM | POA: Diagnosis not present

## 2016-11-01 DIAGNOSIS — R748 Abnormal levels of other serum enzymes: Secondary | ICD-10-CM | POA: Diagnosis not present

## 2016-11-01 LAB — GLUCOSE, CAPILLARY
Glucose-Capillary: 111 mg/dL — ABNORMAL HIGH (ref 65–99)
Glucose-Capillary: 165 mg/dL — ABNORMAL HIGH (ref 65–99)
Glucose-Capillary: 148 mg/dL — ABNORMAL HIGH (ref 65–99)
Glucose-Capillary: 154 mg/dL — ABNORMAL HIGH (ref 65–99)

## 2016-11-01 MED ORDER — SENNOSIDES-DOCUSATE SODIUM 8.6-50 MG PO TABS
2.0000 | ORAL_TABLET | Freq: Two times a day (BID) | ORAL | Status: DC | PRN
  Administered 2016-11-01: 2 via ORAL
  Filled 2016-11-01 (×2): qty 2

## 2016-11-01 MED ORDER — SODIUM CHLORIDE 0.9 % IV SOLN: 250.0000 mL | INTRAVENOUS | Status: DC | PRN

## 2016-11-01 MED ORDER — SODIUM CHLORIDE 0.9 % IV SOLN
INTRAVENOUS | Status: DC
  Administered 2016-11-01: via INTRAVENOUS

## 2016-11-01 MED ORDER — SODIUM CHLORIDE 0.9% FLUSH
3.0000 mL | Freq: Two times a day (BID) | INTRAVENOUS | Status: DC
  Administered 2016-11-01 – 2016-11-02 (×2): 3 mL via INTRAVENOUS

## 2016-11-01 MED ORDER — MORPHINE SULFATE (PF) 4 MG/ML IV SOLN
2.0000 mg | Freq: Three times a day (TID) | INTRAVENOUS | Status: DC | PRN
  Administered 2016-11-01 – 2016-11-02 (×4): 2 mg via INTRAVENOUS
  Filled 2016-11-01 (×3): qty 1

## 2016-11-01 MED ORDER — ASPIRIN 81 MG PO CHEW
81.0000 mg | CHEWABLE_TABLET | ORAL | Status: AC
  Administered 2016-11-02: 81 mg via ORAL
  Filled 2016-11-01: qty 1

## 2016-11-01 MED ORDER — SODIUM CHLORIDE 0.9% FLUSH: 3.0000 mL | INTRAVENOUS | Status: DC | PRN

## 2016-11-01 NOTE — Progress Notes (Signed)
Subjective:  Unstable angina and known coronary disease persistent chest pain symptoms during the hospitalization. Patient still has back pain ready for cardiac catheter tomorrow  Objective:  Vital Signs in the last 24 hours: Temp:  [97.5 F (36.4 C)-98.1 F (36.7 C)] 98.1 F (36.7 C) (12/17 1126) Pulse Rate:  [61-72] 72 (12/17 1126) Resp:  [18-19] 19 (12/17 1126) BP: (119-136)/(72-87) 134/87 (12/17 1126) SpO2:  [91 %-94 %] 91 % (12/17 1126) Weight:  [105.1 kg (231 lb 12.8 oz)] 105.1 kg (231 lb 12.8 oz) (12/17 0347)  Intake/Output from previous day: 12/16 0701 - 12/17 0700 In: 480 [P.O.:480] Out: 2525 [Urine:2525] Intake/Output from this shift: Total I/O In: -  Out: 225 [Urine:225]  Physical Exam: General appearance: appears older than stated age Neck: no adenopathy, no carotid bruit, no JVD, supple, symmetrical, trachea midline and thyroid not enlarged, symmetric, no tenderness/mass/nodules Lungs: clear to auscultation bilaterally Heart: regular rate and rhythm, S3 present, S4 present and systolic ejection murmur 2/6 Abdomen: soft, non-tender; bowel sounds normal; no masses,  no organomegaly Extremities: extremities normal, atraumatic, no cyanosis or edema Pulses: 2+ and symmetric Skin: Skin color, texture, turgor normal. No rashes or lesions Neurologic: Alert and oriented X 3, normal strength and tone. Normal symmetric reflexes. Normal coordination and gait  Lab Results:  Recent Labs  10/30/16 1129 10/31/16 0541  WBC 6.5 6.9  HGB 15.7 15.8  PLT 233 235    Recent Labs  10/29/16 1910 10/30/16 0422  NA 139 138  K 3.2* 3.2*  CL 97* 97*  CO2 33* 33*  GLUCOSE 111* 140*  BUN 12 14  CREATININE 1.23 1.17    Recent Labs  10/30/16 0422 10/30/16 1129  TROPONINI 0.29* 0.27*   Hepatic Function Panel No results for input(s): PROT, ALBUMIN, AST, ALT, ALKPHOS, BILITOT, BILIDIR, IBILI in the last 72 hours.  Recent Labs  10/31/16 0541  CHOL 157   No results for  input(s): PROTIME in the last 72 hours.  Imaging: Imaging results have been reviewed  Cardiac Studies:  Assessment/Plan:  unstable angina  Coronary disease History of coronary bypass surgery Atrial fibrillation Obesity Hyperlipidemia GERD Diabetes Hypertension Gout . Agree with telemetry Continue rule out for myocardial infarction including EKGs and troponin Short-term anticoagulation for unstable angina Hold Eliquis prior to cardiac catheterization Continue Lipitor therapy for lipid management Continue diabetes management currently on metformin Maintain sotalol for rhythm control for A. Fib Continue pain control for low back discomfort Recommend cardiac cath Monday with intention to treat a significant ischemia   LOS: 0 days    Sayyid Harewood D Zarianna Dicarlo 11/01/2016, 11:30 AM

## 2016-11-01 NOTE — Progress Notes (Signed)
[facility] [place] NAME: Lawrence [name]    MR#:  [id number]  DATE OF BIRTH:  1956-11-05  SUBJECTIVE:   Patient is here due to chest pain. Cardiac markers mildly elevated. Continues to have chest pain even at rest. Discussed with cardiology since he continues to have symptoms despite meds adjusted plan for cath tomorrow.   REVIEW OF SYSTEMS:    Review of Systems  Constitutional: Negative for chills and fever.  HENT: Negative for congestion and tinnitus.   Eyes: Negative for blurred vision and double vision.  Respiratory: Negative for cough, shortness of breath and wheezing.   Cardiovascular: Positive for chest pain. Negative for orthopnea and PND.  Gastrointestinal: Negative for abdominal pain, diarrhea, nausea and vomiting.  Genitourinary: Negative for dysuria and hematuria.  Neurological: Negative for dizziness, sensory change and focal weakness.  All other systems reviewed and are negative.   Nutrition: Cardiac Tolerating Diet: yes Tolerating PT: Ambulatory   DRUG ALLERGIES:   Allergies  Allergen Reactions  . Demerol [Meperidine] Hives  . Prednisone Other (See Comments) and Hypertension    Pt states that this medication puts him in A-fib   . Sulfa Antibiotics Hives  . Albuterol Sulfate [Albuterol] Palpitations and Other (See Comments)    Pt currently uses this medication.    . Morphine Sulfate Nausea And Vomiting, Rash and Other (See Comments)    Pt states that he is only allergic to the tablet form of this medication.      VITALS:  Blood pressure 134/87, pulse 72, temperature 98.1 F (36.7 C), temperature source Oral, resp. rate 19, height 5' 7"  (1.702 m), weight 105.1 kg (231 lb 12.8 oz), SpO2 91 %.  PHYSICAL EXAMINATION:   Physical Exam  GENERAL:  60 y.o.-year-old obese patient lying in the bed in no acute distress.  EYES: Pupils equal, round, reactive to light and accommodation. No scleral icterus. Extraocular muscles  intact.  HEENT: Head atraumatic, normocephalic. Oropharynx and nasopharynx clear.  NECK:  Supple, no jugular venous distention. No thyroid enlargement, no tenderness.  LUNGS: Normal breath sounds bilaterally, no wheezing, rales, rhonchi. No use of accessory muscles of respiration.  CARDIOVASCULAR: S1, S2 normal. No murmurs, rubs, or gallops.  ABDOMEN: Soft, nontender, nondistended. Bowel sounds present. No organomegaly or mass.  EXTREMITIES: No cyanosis, clubbing or edema b/l.    NEUROLOGIC: Cranial nerves II through XII are intact. No focal Motor or sensory deficits b/l.   PSYCHIATRIC: The patient is alert and oriented x 3.  SKIN: No obvious rash, lesion, or ulcer.    LABORATORY PANEL:   CBC  Recent Labs Lab 10/31/16 0541  WBC 6.9  HGB 15.8  HCT 47.3  PLT 235   ------------------------------------------------------------------------------------------------------------------  Chemistries   Recent Labs Lab 10/30/16 0422 10/30/16 1129  NA 138  --   K 3.2*  --   CL 97*  --   CO2 33*  --   GLUCOSE 140*  --   BUN 14  --   CREATININE 1.17  --   CALCIUM 9.2  --   MG  --  2.3   ------------------------------------------------------------------------------------------------------------------  Cardiac Enzymes  Recent Labs Lab 10/30/16 1129  TROPONINI 0.27*   ------------------------------------------------------------------------------------------------------------------  RADIOLOGY:  No results found.   ASSESSMENT AND PLAN:   60 year old male with past medical history of coronary artery disease status post bypass, previous history of MI, chronic chest pain, COPD, anxiety, history of atrial fibrillation, who presented to hospital due to chest pain.  1. Chest Pain - due to chronic angina. Patient does have significant history of coronary disease and previous history of bypass. -Troponins are mildly elevated but patient continues to have chest pain even at rest.   -Patient has had a previous cardiac catheterization with extensive disease which was managed medically previously. -Continue adjusted dose of Ranexa, indoor. Plan for cardiac catheterization tomorrow as per cardiology. -We will taper morphine as patient shows high drug seeking behavior.  2. Anxiety-continue Xanax.  3. Essential hypertension-continue Norvasc, metoprolol, Imdur.  4. History of chronic atrial fibrillation-continue sotalol, converted to sinus rhythm. -Eliquis is on hold as patient to get cardiac catheterization on Monday.   5. BPH-continue Flomax. Next  6. History of COPD-no acute exacerbation-continue Spiriva, Breo-Ellipta  7. Hx of CHF - clinically pt. Is not in CHF. Chronic Diastolic - cont. Torsemide, Metoprolol.   Possible d/c home tomorrow if cardiac cath is negative.   All the records are reviewed and case discussed with Care Management/Social Worker. Management plans discussed with the patient, family and they are in agreement.  CODE STATUS: Full code  DVT Prophylaxis: Eliquis  TOTAL TIME TAKING CARE OF THIS PATIENT: 30 minutes.   POSSIBLE D/C IN 1-2 DAYS, DEPENDING ON CLINICAL CONDITION.   Henreitta Leber M.D on 11/01/2016 at 1:23 PM  Between 7am to 6pm - Pager - [phone]  After 6pm go to [email or web address] - Proofreader  [facility]  Office  [phone]  CC: Primary care physician; [name], MD

## 2016-11-01 NOTE — Progress Notes (Signed)
Morphine 2 mg IV given for severe chest pain at 1415, started at an 8 on 1 - 10 scale, now reports it's a 6.  AT this time he says his back pain is an 8 out of 10.  Says the morphine doesn't help his back pain, only Percocet helps.  Percocet 1 tab given now.

## 2016-11-01 NOTE — Progress Notes (Signed)
2nd IV placed in left forearm in preparation for cardiac cath in the am.

## 2016-11-01 NOTE — Progress Notes (Signed)
Refused cpap for the night

## 2016-11-02 ENCOUNTER — Encounter: Payer: Self-pay | Admitting: *Deleted

## 2016-11-02 ENCOUNTER — Encounter
Admission: EM | Disposition: A | Discharge status: Home / Self Care | Payer: Self-pay | Source: Home / Self Care | Attending: Emergency Medicine

## 2016-11-02 DIAGNOSIS — I25110 Atherosclerotic heart disease of native coronary artery with unstable angina pectoris: Secondary | ICD-10-CM | POA: Diagnosis not present

## 2016-11-02 DIAGNOSIS — I10 Essential (primary) hypertension: Secondary | ICD-10-CM | POA: Diagnosis not present

## 2016-11-02 DIAGNOSIS — E785 Hyperlipidemia, unspecified: Secondary | ICD-10-CM | POA: Diagnosis not present

## 2016-11-02 DIAGNOSIS — R748 Abnormal levels of other serum enzymes: Secondary | ICD-10-CM | POA: Diagnosis not present

## 2016-11-02 DIAGNOSIS — I200 Unstable angina: Secondary | ICD-10-CM | POA: Diagnosis not present

## 2016-11-02 HISTORY — PX: CARDIAC CATHETERIZATION: SHX172

## 2016-11-02 LAB — GLUCOSE, CAPILLARY
Glucose-Capillary: 166 mg/dL — ABNORMAL HIGH (ref 65–99)
Glucose-Capillary: 140 mg/dL — ABNORMAL HIGH (ref 65–99)
Glucose-Capillary: 128 mg/dL — ABNORMAL HIGH (ref 65–99)
Glucose-Capillary: 203 mg/dL — ABNORMAL HIGH (ref 65–99)

## 2016-11-02 LAB — HEPARIN LEVEL (UNFRACTIONATED): Heparin Unfractionated: 3.02 IU/mL — ABNORMAL HIGH (ref 0.30–0.70)

## 2016-11-02 LAB — CARDIAC CATHETERIZATION: Cath EF Quantitative: 55 %

## 2016-11-02 SURGERY — LEFT HEART CATH AND CORS/GRAFTS ANGIOGRAPHY
Anesthesia: Moderate Sedation

## 2016-11-02 MED ORDER — ONDANSETRON HCL 4 MG/2ML IJ SOLN: 4.0000 mg | Freq: Four times a day (QID) | INTRAMUSCULAR | Status: DC | PRN

## 2016-11-02 MED ORDER — FENTANYL CITRATE (PF) 100 MCG/2ML IJ SOLN
INTRAMUSCULAR | Status: AC
  Filled 2016-11-02: qty 2

## 2016-11-02 MED ORDER — CLOPIDOGREL BISULFATE 75 MG PO TABS
ORAL_TABLET | ORAL | Status: DC | PRN
  Administered 2016-11-02: 600 mg via ORAL

## 2016-11-02 MED ORDER — HYDRALAZINE HCL 20 MG/ML IJ SOLN: 5.0000 mg | INTRAMUSCULAR | Status: AC | PRN

## 2016-11-02 MED ORDER — SENNOSIDES-DOCUSATE SODIUM 8.6-50 MG PO TABS
1.0000 | ORAL_TABLET | Freq: Two times a day (BID) | ORAL | Status: DC
  Administered 2016-11-02 – 2016-11-03 (×2): 1 via ORAL
  Filled 2016-11-02: qty 1

## 2016-11-02 MED ORDER — IOPAMIDOL (ISOVUE-300) INJECTION 61%
INTRAVENOUS | Status: DC | PRN
  Administered 2016-11-02: 330 mL via INTRA_ARTERIAL

## 2016-11-02 MED ORDER — MIDAZOLAM HCL 2 MG/2ML IJ SOLN
INTRAMUSCULAR | Status: AC
  Filled 2016-11-02: qty 2

## 2016-11-02 MED ORDER — SODIUM CHLORIDE 0.9% FLUSH
3.0000 mL | Freq: Two times a day (BID) | INTRAVENOUS | Status: DC
  Administered 2016-11-03 (×2): 3 mL via INTRAVENOUS

## 2016-11-02 MED ORDER — MIDAZOLAM HCL 2 MG/2ML IJ SOLN
INTRAMUSCULAR | Status: DC | PRN
  Administered 2016-11-02 (×2): 0.5 mg via INTRAVENOUS

## 2016-11-02 MED ORDER — SODIUM CHLORIDE 0.9 % IV SOLN
0.2500 mg/kg/h | INTRAVENOUS | Status: AC
  Filled 2016-11-02: qty 250

## 2016-11-02 MED ORDER — SODIUM CHLORIDE 0.9% FLUSH: 3.0000 mL | INTRAVENOUS | Status: DC | PRN

## 2016-11-02 MED ORDER — LABETALOL HCL 5 MG/ML IV SOLN: 10.0000 mg | INTRAVENOUS | Status: AC | PRN

## 2016-11-02 MED ORDER — HEPARIN (PORCINE) IN NACL 2-0.9 UNIT/ML-% IJ SOLN
INTRAMUSCULAR | Status: AC
  Filled 2016-11-02: qty 500

## 2016-11-02 MED ORDER — MORPHINE SULFATE (PF) 2 MG/ML IV SOLN
INTRAVENOUS | Status: AC
  Filled 2016-11-02: qty 1

## 2016-11-02 MED ORDER — ASPIRIN 81 MG PO CHEW
81.0000 mg | CHEWABLE_TABLET | Freq: Every day | ORAL | Status: DC
  Administered 2016-11-03: 81 mg via ORAL
  Filled 2016-11-02: qty 1

## 2016-11-02 MED ORDER — ASPIRIN 81 MG PO CHEW
CHEWABLE_TABLET | ORAL | Status: AC
  Filled 2016-11-02: qty 4

## 2016-11-02 MED ORDER — CLOPIDOGREL BISULFATE 75 MG PO TABS
ORAL_TABLET | ORAL | Status: AC
  Filled 2016-11-02: qty 8

## 2016-11-02 MED ORDER — SODIUM CHLORIDE 0.9 % IV SOLN
INTRAVENOUS | Status: DC | PRN
  Administered 2016-11-02: 1.75 mg/kg/h via INTRAVENOUS

## 2016-11-02 MED ORDER — SODIUM CHLORIDE 0.9 % WEIGHT BASED INFUSION
1.0000 mL/kg/h | INTRAVENOUS | Status: AC
  Administered 2016-11-02 (×2): 1 mL/kg/h via INTRAVENOUS

## 2016-11-02 MED ORDER — FENTANYL CITRATE (PF) 100 MCG/2ML IJ SOLN: INTRAMUSCULAR | Status: DC | PRN

## 2016-11-02 MED ORDER — BIVALIRUDIN 250 MG IV SOLR
INTRAVENOUS | Status: AC
  Filled 2016-11-02: qty 250

## 2016-11-02 MED ORDER — ASPIRIN 81 MG PO CHEW
CHEWABLE_TABLET | ORAL | Status: DC | PRN
  Administered 2016-11-02: 324 mg via ORAL

## 2016-11-02 MED ORDER — ACETAMINOPHEN 325 MG PO TABS: 650.0000 mg | ORAL_TABLET | ORAL | Status: DC | PRN

## 2016-11-02 MED ORDER — NITROGLYCERIN 5 MG/ML IV SOLN
INTRAVENOUS | Status: AC
  Filled 2016-11-02: qty 10

## 2016-11-02 MED ORDER — SODIUM CHLORIDE 0.9 % IV SOLN: 250.0000 mL | INTRAVENOUS | Status: DC | PRN

## 2016-11-02 MED ORDER — BIVALIRUDIN BOLUS VIA INFUSION - CUPID
INTRAVENOUS | Status: DC | PRN
  Administered 2016-11-02: 78.675 mg via INTRAVENOUS

## 2016-11-02 MED ORDER — CLOPIDOGREL BISULFATE 75 MG PO TABS
75.0000 mg | ORAL_TABLET | Freq: Every day | ORAL | Status: DC
  Administered 2016-11-03: 75 mg via ORAL
  Filled 2016-11-02: qty 1

## 2016-11-02 SURGICAL SUPPLY — 18 items
BALLN TREK RX 3.0X15 (BALLOONS) ×3
BALLOON TREK RX 3.0X15 (BALLOONS) ×1 IMPLANT
CATH 5FR PIGTAIL DIAGNOSTIC (CATHETERS) ×3 IMPLANT
CATH INFINITI 5 FR IM (CATHETERS) ×3 IMPLANT
CATH INFINITI 5FR JL4 (CATHETERS) ×3 IMPLANT
CATH INFINITI JR4 5F (CATHETERS) ×3 IMPLANT
CATH VISTA GUIDE 6FR JR4 SH (CATHETERS) ×3 IMPLANT
DEVICE CLOSURE MYNXGRIP 6/7F (Vascular Products) ×3 IMPLANT
DEVICE INFLAT 30 PLUS (MISCELLANEOUS) ×3 IMPLANT
GUIDEWIRE J TIP .035 (WIRE) ×3 IMPLANT
KIT MANI 3VAL PERCEP (MISCELLANEOUS) ×3 IMPLANT
NEEDLE PERC 18GX7CM (NEEDLE) ×3 IMPLANT
PACK CARDIAC CATH (CUSTOM PROCEDURE TRAY) ×3 IMPLANT
SHEATH AVANTI 5FR X 11CM (SHEATH) ×3 IMPLANT
SHEATH AVANTI 6FR X 11CM (SHEATH) ×3 IMPLANT
STENT XIENCE ALPINE RX 3.5X15 (Permanent Stent) ×3 IMPLANT
WIRE EMERALD 3MM-J .035X150CM (WIRE) ×3 IMPLANT
WIRE G HI TQ BMW 190 (WIRE) ×3 IMPLANT

## 2016-11-02 NOTE — Progress Notes (Signed)
Returned from card cath at about 1700.  Site is clean and dry, skin surrounding is soft.  He sat up about 10 minutes ago.  Has not yet stood up.  Site remains clean and dry.

## 2016-11-02 NOTE — Plan of Care (Signed)
Problem: Pain Managment: Goal: General experience of comfort will improve Outcome: Not Progressing Patient pain remains 6/10 despite constantly receiving pain medication throughout the day and night.

## 2016-11-02 NOTE — Progress Notes (Signed)
Report to [facility] telemetry.  Check right groin for bleeding or hematoma.  Patient will be on bedrest for 2 hours post sheath pull---out of bed at 17:05.  Bilateral pulses are 2's DP's.Marland Kitchen

## 2016-11-02 NOTE — Progress Notes (Signed)
[facility] [place] NAME: Lawrence [name]    MR#:  [id number]  DATE OF BIRTH:  1956-08-25  SUBJECTIVE:   Patient is here due to chest pain. Cardiac markers mildly elevated. Status post cardiac catheterization today with stent placement to the graft to the circumflex. Still continues to have chest pain.  REVIEW OF SYSTEMS:    Review of Systems  Constitutional: Negative for chills and fever.  HENT: Negative for congestion and tinnitus.   Eyes: Negative for blurred vision and double vision.  Respiratory: Negative for cough, shortness of breath and wheezing.   Cardiovascular: Positive for chest pain. Negative for orthopnea and PND.  Gastrointestinal: Negative for abdominal pain, diarrhea, nausea and vomiting.  Genitourinary: Negative for dysuria and hematuria.  Neurological: Negative for dizziness, sensory change and focal weakness.  All other systems reviewed and are negative.   Nutrition: Cardiac Tolerating Diet: yes Tolerating PT: Ambulatory   DRUG ALLERGIES:   Allergies  Allergen Reactions  . Demerol [Meperidine] Hives  . Prednisone Other (See Comments) and Hypertension    Pt states that this medication puts him in A-fib   . Sulfa Antibiotics Hives  . Albuterol Sulfate [Albuterol] Palpitations and Other (See Comments)    Pt currently uses this medication.    . Morphine Sulfate Nausea And Vomiting, Rash and Other (See Comments)    Pt states that he is only allergic to the tablet form of this medication.      VITALS:  Blood pressure 135/80, pulse 70, temperature 98.1 F (36.7 C), resp. rate 16, height 5' 7"  (1.702 m), weight 104.9 kg (231 lb 4.2 oz), SpO2 93 %.  PHYSICAL EXAMINATION:   Physical Exam  GENERAL:  60 y.o.-year-old obese patient lying in the bed in no acute distress.  EYES: Pupils equal, round, reactive to light and accommodation. No scleral icterus. Extraocular muscles intact.  HEENT: Head atraumatic,  normocephalic. Oropharynx and nasopharynx clear.  NECK:  Supple, no jugular venous distention. No thyroid enlargement, no tenderness.  LUNGS: Normal breath sounds bilaterally, no wheezing, rales, rhonchi. No use of accessory muscles of respiration.  CARDIOVASCULAR: S1, S2 normal. No murmurs, rubs, or gallops.  ABDOMEN: Soft, nontender, nondistended. Bowel sounds present. No organomegaly or mass.  EXTREMITIES: No cyanosis, clubbing or edema b/l.    NEUROLOGIC: Cranial nerves II through XII are intact. No focal Motor or sensory deficits b/l.   PSYCHIATRIC: The patient is alert and oriented x 3.  SKIN: No obvious rash, lesion, or ulcer.    LABORATORY PANEL:   CBC  Recent Labs Lab 10/31/16 0541  WBC 6.9  HGB 15.8  HCT 47.3  PLT 235   ------------------------------------------------------------------------------------------------------------------  Chemistries   Recent Labs Lab 10/30/16 0422 10/30/16 1129  NA 138  --   K 3.2*  --   CL 97*  --   CO2 33*  --   GLUCOSE 140*  --   BUN 14  --   CREATININE 1.17  --   CALCIUM 9.2  --   MG  --  2.3   ------------------------------------------------------------------------------------------------------------------  Cardiac Enzymes  Recent Labs Lab 10/30/16 1129  TROPONINI 0.27*   ------------------------------------------------------------------------------------------------------------------  RADIOLOGY:  No results found.   ASSESSMENT AND PLAN:   60 year old male with past medical history of coronary artery disease status post bypass, previous history of MI, chronic chest pain, COPD, anxiety, history of atrial fibrillation, who presented to hospital due to chest pain.   1. Chest Pain - due to chronic  angina. Patient does have significant history of coronary disease and previous history of bypass. -Troponins are mildly elevated but patient continues to have chest pain even at rest. 60 is status post cardiac  catheterization today with stent placement to the venous graft to the circumflex. Discussed with cardiology and continue aspirin, Plavix, Eliquis for at least one month and then can discontinue aspirin. -Continue metoprolol, statin, isosorbide, Ranexa. Likely discharge home tomorrow.   2. Anxiety-continue Xanax.  3. Essential hypertension-continue Norvasc, metoprolol, Imdur.  4. History of chronic atrial fibrillation-continue sotalol, converted to sinus rhythm. - will resume Eliquis upon discharge tomorrow.    5. BPH-continue Flomax.  - no urinary retention.  6. History of COPD-no acute exacerbation - continue Spiriva, Breo-Ellipta  7. Hx of CHF - clinically pt. Is not in CHF. Chronic Diastolic - cont. Torsemide, Metoprolol.   D/c home tomorrow.   All the records are reviewed and case discussed with Care Management/Social Worker. Management plans discussed with the patient, family and they are in agreement.  CODE STATUS: Full code  DVT Prophylaxis: Eliquis  TOTAL TIME TAKING CARE OF THIS PATIENT: 30 minutes.   POSSIBLE D/C IN 1-2 DAYS, DEPENDING ON CLINICAL CONDITION.   Henreitta Leber M.D on 11/02/2016 at 3:53 PM  Between 7am to 6pm - Pager - [phone]  After 6pm go to [email or web address] - Proofreader  [facility]  Office  [phone]  CC: Primary care physician; [name], MD

## 2016-11-02 NOTE — Consult Note (Signed)
[facility] Inpatient Consult   11/02/2016  Lawrence [name] [birth year] [id number]   Patient is currently active with [facility] for chronic disease management services.  Patient has been engaged by a Geologist, engineering and [facility] CSW. Spoke with [place] following patient who stated she is currently scheduled to see patient tomorrow at the motel he and his family are staying while they await housing from [employer]. She stated she will plan to keep appointment if patient discharges today. Our community based plan of care has focused on disease management and community resource support.  Patient will receive a post discharge transition of care call  and monthly home visits for assessments and disease process education.  Made Inpatient Case Manager aware that [facility] following. Of note, [facility] Care Management services does not replace or interfere with any services that are needed or arranged by inpatient case management or social work.  For additional questions or referrals please contact:  Tayia Stonesifer RN, [facility] Liaison  [phone]) [place] [phone]) Toll free office

## 2016-11-03 ENCOUNTER — Encounter: Payer: Self-pay | Admitting: Internal Medicine

## 2016-11-03 ENCOUNTER — Ambulatory Visit: Payer: Self-pay | Admitting: *Deleted

## 2016-11-03 DIAGNOSIS — E785 Hyperlipidemia, unspecified: Secondary | ICD-10-CM | POA: Diagnosis not present

## 2016-11-03 DIAGNOSIS — I25110 Atherosclerotic heart disease of native coronary artery with unstable angina pectoris: Secondary | ICD-10-CM | POA: Diagnosis not present

## 2016-11-03 DIAGNOSIS — I200 Unstable angina: Secondary | ICD-10-CM | POA: Diagnosis not present

## 2016-11-03 DIAGNOSIS — I10 Essential (primary) hypertension: Secondary | ICD-10-CM | POA: Diagnosis not present

## 2016-11-03 DIAGNOSIS — R748 Abnormal levels of other serum enzymes: Secondary | ICD-10-CM | POA: Diagnosis not present

## 2016-11-03 LAB — GLUCOSE, CAPILLARY
Glucose-Capillary: 144 mg/dL — ABNORMAL HIGH (ref 65–99)
Glucose-Capillary: 168 mg/dL — ABNORMAL HIGH (ref 65–99)

## 2016-11-03 MED ORDER — ISOSORBIDE MONONITRATE ER 60 MG PO TB24: 60.0000 mg | ORAL_TABLET | Freq: Every day | ORAL | 1 refills | Status: DC

## 2016-11-03 MED ORDER — RANOLAZINE ER 1000 MG PO TB12: 1000.0000 mg | ORAL_TABLET | Freq: Two times a day (BID) | ORAL | 1 refills | Status: DC

## 2016-11-03 MED ORDER — CLOPIDOGREL BISULFATE 75 MG PO TABS: 75.0000 mg | ORAL_TABLET | Freq: Every day | ORAL | 1 refills | Status: DC

## 2016-11-03 NOTE — Discharge Summary (Signed)
[facility] NAME: Stanely Sexson    MR#:  [id number]  DATE OF BIRTH:  01-27-1956  DATE OF ADMISSION:  10/29/2016 ADMITTING PHYSICIAN: [name], MD  DATE OF DISCHARGE: 11/03/2016  1:12 PM  PRIMARY CARE PHYSICIAN: [name], MD    ADMISSION DIAGNOSIS:  NSTEMI (non-ST elevated myocardial infarction) ([place]) [I21.4] Chest pain, unspecified type [R07.9]  DISCHARGE DIAGNOSIS:  Principal Problem:   Unstable angina ([place]) Active Problems:   CAD (coronary artery disease)   HTN (hypertension)   DM (diabetes mellitus) (HCC)   GERD (gastroesophageal reflux disease)   HLD (hyperlipidemia)   COPD (chronic obstructive pulmonary disease) (HCC)   Chronic diastolic CHF (congestive heart failure) (HCC)   PAF (paroxysmal atrial fibrillation) (HCC)   Anxiety   OSA (obstructive sleep apnea)   SECONDARY DIAGNOSIS:   Past Medical History:  Diagnosis Date  . A-fib ([place])   . Anemia   . Anxiety   . Asthma   . CAD (coronary artery disease)    a. 2002 CABGx2 (LIMA->LAD, VG->VG->OM1);  b. 09/2012 DES->OM;  c. 03/2015 PTCA of LAD [facility]) in setting of atretic LIMA; d. 05/2015 Cath [facility]): nonobs dzs; e. 06/2015 Cath ([facility]): LM nl, LAD 45p/d ISR, 50d, D1/2 small, LCX 50p/d ISR, OM1 70ost, 30 ISR, VG->OM1 50ost, 54m LIMA->LAD 99p/d - atretic, RCA dom, nl; f.cath 10/16: 40-50%(FFR 0.90) pLAD, 75% (FFR 0.77) mLAD s/p PCI/DES, oRCA 40% (FFR0.95)  . Celiac disease   . Chronic diastolic CHF (congestive heart failure) ([place]    a. 06/2009 Echo: EF 60-65%, Gr 1 DD, triv AI, mildly dil LA, nl RV.  .Marland KitchenCOPD (chronic obstructive pulmonary disease) ([facility]    a. Chronic bronchitis and emphysema.  .Marland KitchenDysrhythmia   . Essential hypertension   . History of tobacco abuse    a. Quit 2014.  .Marland KitchenPSVT (paroxysmal supraventricular tachycardia) ([place]    a. 10/2012 Noted on [facility].  . Type II diabetes mellitus ([facility]     HOSPITAL COURSE:   60year old male with past  medical history of coronary artery disease status post bypass, previous history of MI, chronic chest pain, COPD, anxiety, history of atrial fibrillation, who presented to hospital due to chest pain.   1. Chest Pain - due to chronic angina. Patient does have significant history of coronary disease and previous history of bypass. -Troponins are mildly elevated but patient continued to have chest pain even at rest. -Patient was seen by cardiology and given his multiple risk factors he underwent a cardiac catheterization on 11/02/2016 and is a status post stent to one of the venous grafts to the circumflex. Patient post cardiac catheterization is chest pain-free and stable. -As per cardiology patient is to be discharged on aspirin, Plavix, metoprolol, Imdur, Ranexa with close follow-up with cardiology as an outpatient. Patient will also continue his high-dose intensity statin.  - as per Cardiology pt. Will need to be on ASA, Plavix, Eliquis for 1 month and then can d/c ASA. Further care as per Cardiology as outpatient.   2. Anxiety-pt. Will continue Xanax.  3. Essential hypertension-pt. Will continue Norvasc, metoprolol, Imdur.  4. History of chronic atrial fibrillation- pt. Will continue sotalol, converted to sinus rhythm. - he will resume his Eliquis upon discharge which was held for cardiac cath while in the hospital.   5. BPH-he will continue Flomax.   6. History of COPD-no acute exacerbation - pt. Will continue Spiriva, Breo-Ellipta  7. Hx of CHF - clinically  pt. Is not in CHF while in the hospital. Chronic Diastolic - he will  cont. Torsemide, Metoprolol.   DISCHARGE CONDITIONS:   Stable  CONSULTS OBTAINED:  Treatment Team:  Yolonda Kida, MD  DRUG ALLERGIES:   Allergies  Allergen Reactions  . Demerol [Meperidine] Hives  . Prednisone Other (See Comments) and Hypertension    Pt states that this medication puts him in A-fib   . Sulfa Antibiotics Hives  . Albuterol  Sulfate [Albuterol] Palpitations and Other (See Comments)    Pt currently uses this medication.    . Morphine Sulfate Nausea And Vomiting, Rash and Other (See Comments)    Pt states that he is only allergic to the tablet form of this medication.      DISCHARGE MEDICATIONS:   Allergies as of 11/03/2016      Reactions   Demerol [meperidine] Hives   Prednisone Other (See Comments), Hypertension   Pt states that this medication puts him in A-fib    Sulfa Antibiotics Hives   Albuterol Sulfate [albuterol] Palpitations, Other (See Comments)   Pt currently uses this medication.     Morphine Sulfate Nausea And Vomiting, Rash, Other (See Comments)   Pt states that he is only allergic to the tablet form of this medication.        Medication List    TAKE these medications   albuterol 108 (90 Base) MCG/ACT inhaler Commonly known as:  PROVENTIL HFA;VENTOLIN HFA Inhale 4-6 puffs into the lungs every 4 (four) hours as needed for wheezing or shortness of breath. Reported on 04/08/2016   allopurinol 100 MG tablet Commonly known as:  ZYLOPRIM THREE (3) TABLETS TWICE DAILY   ALPRAZolam 1 MG tablet Commonly known as:  XANAX Take 0.5-1 tablets (0.5-1 mg total) by mouth 3 (three) times daily as needed for anxiety.   amLODipine 10 MG tablet Commonly known as:  NORVASC Take 10 mg by mouth daily.   aspirin 81 MG EC tablet Take 81 mg by mouth daily. Swallow whole.   atorvastatin 80 MG tablet Commonly known as:  LIPITOR TAKE 1 TABLET BY MOUTH EVERY NIGHT AT BEDTIME.   BREO ELLIPTA 100-25 MCG/INH Aepb Generic drug:  fluticasone furoate-vilanterol Inhale 1 puff into the lungs daily.   cetirizine 10 MG tablet Commonly known as:  ZYRTEC TAKE ONE TABLET AT BEDTIME   clopidogrel 75 MG tablet Commonly known as:  PLAVIX Take 1 tablet (75 mg total) by mouth daily with breakfast. Start taking on:  11/04/2016   cyclobenzaprine 10 MG tablet Commonly known as:  FLEXERIL TAKE 1 TABLET BY MOUTH  EVERY 8 HOURS AS NEEEDED FOR MUSCLE SPASM   docusate sodium 100 MG capsule Commonly known as:  COLACE Take 100 mg by mouth 2 (two) times daily.   ELIQUIS 5 MG Tabs tablet Generic drug:  apixaban Take 5 mg by mouth 2 (two) times daily.   ferrous sulfate 325 (65 FE) MG tablet Take 325 mg by mouth daily with breakfast.   glucose blood test strip Commonly known as:  ACCU-CHEK AVIVA PLUS Use to check blood sugar once a day   isosorbide mononitrate 60 MG 24 hr tablet Commonly known as:  IMDUR Take 1 tablet (60 mg total) by mouth daily. Start taking on:  11/04/2016   lansoprazole 30 MG capsule Commonly known as:  PREVACID Take 30 mg by mouth 2 (two) times daily.   magnesium oxide 400 (241.3 Mg) MG tablet Commonly known as:  MAG-OX Take 400 mg by mouth daily.  metFORMIN 500 MG tablet Commonly known as:  GLUCOPHAGE TAKE 1 TABLET BY MOUTH EVERY MORNING.   metoprolol tartrate 25 MG tablet Commonly known as:  LOPRESSOR Take 0.5 tablets (12.5 mg total) by mouth 2 (two) times daily. What changed:  how much to take  when to take this   nitroGLYCERIN 0.4 MG SL tablet Commonly known as:  NITROSTAT Place 1 tablet (0.4 mg total) under the tongue every 5 (five) minutes as needed for chest pain. Reported on 05/26/2016   omega-3 acid ethyl esters 1 g capsule Commonly known as:  LOVAZA TAKE FOUR CAPSULES BY MOUTH DAILY   oxyCODONE-acetaminophen 10-325 MG tablet Commonly known as:  PERCOCET Take 1 tablet by mouth every 4 (four) hours as needed.   RANEXA 500 MG 12 hr tablet Generic drug:  ranolazine TAKE TWO (2) TABLETS TWICE A DAY What changed:  Another medication with the same name was added. Make sure you understand how and when to take each.   ranolazine 1000 MG SR tablet Commonly known as:  RANEXA Take 1 tablet (1,000 mg total) by mouth 2 (two) times daily. What changed:  You were already taking a medication with the same name, and this prescription was added. Make sure you  understand how and when to take each.   sotalol 120 MG tablet Commonly known as:  BETAPACE Take 120 mg by mouth daily.   sucralfate 1 g tablet Commonly known as:  CARAFATE Take 1 tablet (1 g total) by mouth 2 (two) times daily.   tamsulosin 0.4 MG Caps capsule Commonly known as:  FLOMAX Take 0.4 mg by mouth daily after breakfast.   tiotropium 18 MCG inhalation capsule Commonly known as:  SPIRIVA Place 18 mcg into inhaler and inhale daily.   torsemide 100 MG tablet Commonly known as:  DEMADEX Take 100 mg by mouth daily.         DISCHARGE INSTRUCTIONS:   DIET:  Cardiac diet and Diabetic diet  DISCHARGE CONDITION:  Stable  ACTIVITY:  Activity as tolerated  OXYGEN:  Home Oxygen: Yes.     Oxygen Delivery: 2 liters/min via Patient connected to nasal cannula oxygen  DISCHARGE LOCATION:  home   If you experience worsening of your admission symptoms, develop shortness of breath, life threatening emergency, suicidal or homicidal thoughts you must seek medical attention immediately by calling 911 or calling your MD immediately  if symptoms less severe.  You Must read complete instructions/literature along with all the possible adverse reactions/side effects for all the Medicines you take and that have been prescribed to you. Take any new Medicines after you have completely understood and accpet all the possible adverse reactions/side effects.   Please note  You were cared for by a hospitalist during your hospital stay. If you have any questions about your discharge medications or the care you received while you were in the hospital after you are discharged, you can call the unit and asked to speak with the hospitalist on call if the hospitalist that took care of you is not available. Once you are discharged, your primary care physician will handle any further medical issues. Please note that NO REFILLS for any discharge medications will be authorized once you are discharged,  as it is imperative that you return to your primary care physician (or establish a relationship with a primary care physician if you do not have one) for your aftercare needs so that they can reassess your need for medications and monitor your lab values.  Today   Feels better, Currently CP Free.  No other complaints presently. Will d/c home  VITAL SIGNS:  Blood pressure 122/77, pulse 65, temperature 97.9 F (36.6 C), temperature source Oral, resp. rate 19, height 5' 7"  (1.702 m), weight 103.6 kg (228 lb 8 oz), SpO2 91 %.  I/O:   Intake/Output Summary (Last 24 hours) at 11/03/16 1632 Last data filed at 11/03/16 1033  Gross per 24 hour  Intake           1776.9 ml  Output             2975 ml  Net          -1198.1 ml    PHYSICAL EXAMINATION:  GENERAL:  60 y.o.-year-old patient lying in the bed with no acute distress.  EYES: Pupils equal, round, reactive to light and accommodation. No scleral icterus. Extraocular muscles intact.  HEENT: Head atraumatic, normocephalic. Oropharynx and nasopharynx clear.  NECK:  Supple, no jugular venous distention. No thyroid enlargement, no tenderness.  LUNGS: Normal breath sounds bilaterally, no wheezing, rales,rhonchi. No use of accessory muscles of respiration.  CARDIOVASCULAR: S1, S2 normal. No murmurs, rubs, or gallops.  ABDOMEN: Soft, non-tender, non-distended. Bowel sounds present. No organomegaly or mass.  EXTREMITIES: No pedal edema, cyanosis, or clubbing.  NEUROLOGIC: Cranial nerves II through XII are intact. No focal motor or sensory defecits b/l.  PSYCHIATRIC: The patient is alert and oriented x 3. Good affect.  SKIN: No obvious rash, lesion, or ulcer.   DATA REVIEW:   CBC  Recent Labs Lab 10/31/16 0541  WBC 6.9  HGB 15.8  HCT 47.3  PLT 235    Chemistries   Recent Labs Lab 10/30/16 0422 10/30/16 1129  NA 138  --   K 3.2*  --   CL 97*  --   CO2 33*  --   GLUCOSE 140*  --   BUN 14  --   CREATININE 1.17  --    CALCIUM 9.2  --   MG  --  2.3    Cardiac Enzymes  Recent Labs Lab 10/30/16 1129  TROPONINI 0.27*    Microbiology Results  Results for orders placed or performed during the hospital encounter of 07/11/15  MRSA PCR Screening     Status: None   Collection Time: 07/11/15  9:09 PM  Result Value Ref Range Status   MRSA by PCR NEGATIVE NEGATIVE Final    Comment:        The GeneXpert MRSA Assay (FDA approved for NASAL specimens only), is one component of a comprehensive MRSA colonization surveillance program. It is not intended to diagnose MRSA infection nor to guide or monitor treatment for MRSA infections.     RADIOLOGY:  No results found.    Management plans discussed with the patient, family and they are in agreement.  CODE STATUS:  Code Status History    Date Active Date Inactive Code Status Order ID Comments User Context   10/29/2016 10:22 PM 11/03/2016  4:18 PM Full Code [id number]  Lance Coon, MD Inpatient   09/03/2016  1:43 AM 09/03/2016  6:46 PM Full Code [id number]  Lance Coon, MD ED   06/25/2016 12:36 AM 06/26/2016  3:35 PM Full Code [id number]  Lance Coon, MD ED   04/03/2016  8:55 PM 04/07/2016  5:59 PM Full Code [id number]  Theodoro Grist, MD Inpatient   03/26/2016 10:24 PM 03/28/2016  6:00 PM Full Code [id number]  Lance Coon, MD Inpatient   10/07/2015  9:36 AM 10/07/2015  6:30 PM Full Code [id number]  Dionisio David, MD Inpatient   10/07/2015  8:26 AM 10/07/2015  9:36 AM Full Code [id number]  Dionisio David, MD Inpatient   10/05/2015  7:29 PM 10/07/2015  8:26 AM Full Code [id number]  Idelle Crouch, MD Inpatient   09/12/2015  9:29 PM 09/14/2015  2:25 PM Full Code [id number]  Demetrios Loll, MD Inpatient   08/01/2015  5:21 AM 08/02/2015  1:46 PM Full Code [id number]  Lance Coon, MD Inpatient   07/12/2015  3:24 PM 07/13/2015  8:17 PM Full Code [id number]  Belva Crome, MD Inpatient   07/11/2015  8:00 PM 07/12/2015  3:24 PM Full Code [id number]  Lonn Georgia, PA-C  Inpatient   06/26/2015  5:43 AM 06/27/2015  3:30 PM Full Code [id number]  Juluis Mire, MD Inpatient    Advance Directive Documentation   Flowsheet Row Most Recent Value  Type of Advance Directive  Healthcare Power of Attorney  Pre-existing out of facility DNR order (yellow form or pink MOST form)  No data  "MOST" Form in Place?  No data      TOTAL TIME TAKING CARE OF THIS PATIENT: 40 minutes.    Henreitta Leber M.D on 11/03/2016 at 4:32 PM  Between 7am to 6pm - Pager - [phone]  After 6pm go to [email or web address] - Proofreader  [facility]  Office  [phone]  CC: Primary care physician; [name], MD

## 2016-11-03 NOTE — Discharge Instructions (Signed)
Angina Pectoris °Angina pectoris, often called angina, is extreme discomfort in the chest, neck, or arm. This is caused by a lack of blood in the middle and thickest layer of the heart wall (myocardium). There are four types of angina: °· Stable angina. Stable angina usually occurs in episodes of predictable frequency and duration. It is usually brought on by physical activity, stress, or excitement. Stable angina usually lasts a few minutes and can often be relieved by a medicine that you place under your tongue. This medicine is called sublingual nitroglycerin. °· Unstable angina. Unstable angina can occur even when you are doing little or no physical activity. It can even occur while you are sleeping or when you are at rest. It can suddenly increase in severity or frequency. It may not be relieved by sublingual nitroglycerin, and it can last up to 30 minutes. °· Microvascular angina. This type of angina is caused by a disorder of tiny blood vessels called arterioles. Microvascular angina is more common in women. The pain may be more severe and last longer than other types of angina pectoris. °· Prinzmetal or variant angina. This type of angina pectoris is rare and usually occurs when you are doing little or no physical activity. It especially occurs in the early morning hours. ° °What are the causes? °Atherosclerosis is the cause of angina. This is the buildup of fat and cholesterol (plaque) on the inside of the arteries. Over time, the plaque may narrow or block the artery, and this will lessen blood flow to the heart. Plaque can also become weak and break off within a coronary artery to form a clot and cause a sudden blockage. °What increases the risk? °Risk factors common to both men and women include: °· High cholesterol levels. °· High blood pressure (hypertension). °· Tobacco use. °· Diabetes. °· Family history of angina. °· Obesity. °· Lack of exercise. °· A diet high in saturated fats. ° °Women are at  greater risk for angina if they are: °· Over age 55. °· Postmenopausal. ° °What are the signs or symptoms? °Many people do not experience any symptoms during the early stages of angina. As the condition progresses, symptoms common to both men and women may include: °· Chest pain. °? The pain can be described as a crushing or squeezing in the chest, or a tightness, pressure, fullness, or heaviness in the chest. °? The pain can last more than a few minutes, or it can stop and recur. °· Pain in the arms, neck, jaw, or back. °· Unexplained heartburn or indigestion. °· Shortness of breath. °· Nausea. °· Sudden cold sweats. °· Sudden light-headedness. ° °Many women have chest discomfort and some of the other symptoms. However, women often have different (atypical) symptoms, such as: °· Fatigue. °· Unexplained feelings of nervousness or anxiety. °· Unexplained weakness. °· Dizziness or fainting. ° °Sometimes, women may have angina without any symptoms. °How is this diagnosed? °Tests to diagnose angina may include: °· ECG (electrocardiogram). °· Exercise stress test. This looks for signs of blockage when the heart is being exercised. °· Pharmacologic stress test. This test looks for signs of blockage when the heart is being stressed with a medicine. °· Blood tests. °· Coronary angiogram. This is a procedure to look at the coronary arteries to see if there is any blockage. ° °How is this treated? °The treatment of angina may include the following: °· Healthy behavioral changes to reduce or control risk factors. °· Medicine. °· Coronary stenting. A stent helps   to keep an artery open. °· Coronary angioplasty. This procedure widens a narrowed or blocked artery. °· Coronary artery bypass surgery. This will allow your blood to pass the blockage (bypass) to reach your heart. ° °Follow these instructions at home: °· Take medicines only as directed by your health care provider. °· Do not take the following medicines unless your  health care provider approves: °? Nonsteroidal anti-inflammatory drugs (NSAIDs), such as ibuprofen, naproxen, or celecoxib. °? Vitamin supplements that contain vitamin A, vitamin E, or both. °? Hormone replacement therapy that contains estrogen with or without progestin. °· Manage other health conditions such as hypertension and diabetes as directed by your health care provider. °· Follow a heart-healthy diet. A dietitian can help to educate you about healthy food options and changes. °· Use healthy cooking methods such as roasting, grilling, broiling, baking, poaching, steaming, or stir-frying. Talk to a dietitian to learn more about healthy cooking methods. °· Follow an exercise program approved by your health care provider. °· Maintain a healthy weight. Lose weight as approved by your health care provider. °· Plan rest periods when fatigued. °· Learn to manage stress. °· Do not use any tobacco products, including cigarettes, chewing tobacco, or electronic cigarettes. If you need help quitting, ask your health care provider. °· If you drink alcohol, and your health care provider approves, limit your alcohol intake to no more than 1 drink per day. One drink equals 12 ounces of beer, 5 ounces of wine, or 1½ ounces of hard liquor. °· Stop illegal drug use. °· Keep all follow-up visits as directed by your health care provider. This is important. °Get help right away if: °· You have pain in your chest, neck, arm, jaw, stomach, or back that lasts more than a few minutes, is recurring, or is unrelieved by taking sublingual nitroglycerin. °· You have profuse sweating without cause. °· You have unexplained: °? Heartburn or indigestion. °? Shortness of breath or difficulty breathing. °? Nausea or vomiting. °? Fatigue. °? Feelings of nervousness or anxiety. °? Weakness. °? Diarrhea. °· You have sudden light-headedness or dizziness. °· You faint. °These symptoms may represent a serious problem that is an emergency. Do not  wait to see if the symptoms will go away. Get medical help right away. Call your local emergency services (911 in the [place] Do not drive yourself to the hospital. °This information is not intended to replace advice given to you by your health care provider. Make sure you discuss any questions you have with your health care provider. °Document Released: 11/02/2005 Document Revised: 04/15/2016 Document Reviewed: 03/06/2014 °Elsevier Interactive Patient Education © 2017 [employer] Inc[employer] ° °

## 2016-11-04 ENCOUNTER — Other Ambulatory Visit: Payer: Self-pay | Admitting: *Deleted

## 2016-11-04 ENCOUNTER — Other Ambulatory Visit: Payer: Self-pay

## 2016-11-04 ENCOUNTER — Telehealth: Payer: Self-pay

## 2016-11-04 NOTE — Patient Outreach (Signed)
Attempt made to contact pt for transition of care, follow up on recent hospitalization 12/14 -12/19 for NSTEMI, chest pain.   HIPAA compliant voice message left with contact name and number.    Plan:  If no response, will have coworker Pam RN CM covering for this RN CM follow up with pt again telephonically.

## 2016-11-04 NOTE — Telephone Encounter (Signed)
Transition Care Management Follow-up Telephone Call    Date discharged? 11/03/16  How have you been since you were released from the hospital? Recovering, doing well.  Any patient concerns? none   Items Reviewed:  Medications reviewed: Yes  Allergies reviewed: Yes  Dietary changes reviewed: Yes, cardiac and diabetic diet advised.  Referrals reviewed: Yes, pt has a f/u appointment with Dr. [name] on 11/12/16.   Functional Questionnaire:  Independent - I Dependent - D    Activities of Daily Living (ADLs):    Personal hygiene - I Dressing - I Eating - I Maintaining continence - I Transferring - I   Independent Activities of Daily Living (iADLs): Basic communication skills - I Transportation - I Meal preparation  - I Shopping - I Housework - I Managing medications - I  Managing personal finances - I   Confirmed importance and date/time of follow-up visits scheduled YES  Provider Appointment booked with PCP 11/19/16 @ 8AM.  Confirmed with patient if condition begins to worsen call PCP or go to the ER.  Patient was given the office number and encouraged to call back with question or concerns: YES

## 2016-11-04 NOTE — Patient Outreach (Signed)
Error.   Lawrence [name].   [facility]  [phone]

## 2016-11-05 ENCOUNTER — Other Ambulatory Visit: Payer: Self-pay | Admitting: Family Medicine

## 2016-11-05 NOTE — Telephone Encounter (Signed)
Pt needs refill  oxyCODONE-acetaminophen (PERCOCET) 10-325 MG tablet  Thanks Con Memos

## 2016-11-06 ENCOUNTER — Other Ambulatory Visit: Payer: Self-pay | Admitting: *Deleted

## 2016-11-06 MED ORDER — OXYCODONE-ACETAMINOPHEN 10-325 MG PO TABS: 1.0000 | ORAL_TABLET | ORAL | 0 refills | Status: DC | PRN

## 2016-11-06 NOTE — Patient Outreach (Signed)
Transition of care call successful, follow up on recent hospitalization 12/14-12/19 chest pain, Left heart cath 12/18.   Spoke with pt, HIPAA/identity verified.   Pt reports doing good since discharge, taking it easy.  Pt reports on cardiac cath done, replaced a former stent that was stopped up.  Pt reports he feels better, no more  chest pain, no edema, no sob except when walking  long distances which is coming from his COPD.  Pt reports he was told during his catheterization, was in Afib but then went back in normal sinus rhythm.  Pt reports remains on 2 liters [place].  Pt reports was suppose to see Dr. [name] 12/28 but their office called because MD will not be in that day, rescheduled for 11/20/16.  Pt reports to see Dr. [name] 11/19/16.  Pt reports taking all of his medications as ordered. Patient was recently discharged from hospital and all medications have been reviewed.  Pt reports he will remain in hotel until tax time (around February), 262 on list of 400 for [facility].  Pt reports no problems affording hotel at this time.     Plan:  As discussed with pt, plan to continue to follow for transition of care, follow up again next week for home visit.  Zara Chess.   [facility]  [phone]

## 2016-11-09 DIAGNOSIS — G4733 Obstructive sleep apnea (adult) (pediatric): Secondary | ICD-10-CM | POA: Diagnosis not present

## 2016-11-11 DIAGNOSIS — I10 Essential (primary) hypertension: Secondary | ICD-10-CM | POA: Diagnosis not present

## 2016-11-11 DIAGNOSIS — G4710 Hypersomnia, unspecified: Secondary | ICD-10-CM | POA: Diagnosis not present

## 2016-11-11 DIAGNOSIS — J449 Chronic obstructive pulmonary disease, unspecified: Secondary | ICD-10-CM | POA: Diagnosis not present

## 2016-11-11 DIAGNOSIS — J439 Emphysema, unspecified: Secondary | ICD-10-CM | POA: Diagnosis not present

## 2016-11-13 ENCOUNTER — Other Ambulatory Visit: Payer: Self-pay | Admitting: *Deleted

## 2016-11-13 NOTE — Patient Outreach (Signed)
[facility]) Care Management   11/13/2016  PELHAM HENNICK 1956/05/18 [id number]  AIDDEN MARKOVIC is an 60 y.o. male  Subjective:  Pt reports no chest pain since cardiac cath/stent done.   Pt reports will have a  Little sob when overdoing it, was suppose to see Dr. Chancy Milroy while in patient, MD's   Office to  call back and reschedule appointment.  Pt reports to see Dr. [name] 1/5, MD offered Him a scale in the past, plan to ask him. Pt reports to see Dr. [name] 1/4.  Pt reports most recent  Sugar was last taken last week - 120, need to call Dr. [name] to get  refill on strips.    Pt reports  He watches his salt living in motel, drains/rinses canned vegetables, does eggs in the microwave.     Objective:  Vitals:   11/13/16 1322  BP: 110/80  Pulse: 64  Resp: 16     ROS  Physical Exam  Constitutional: He is oriented to person, place, and time. He appears well-developed and well-nourished.  Cardiovascular: Normal rate, regular rhythm and normal heart sounds.   Respiratory: Effort normal and breath sounds normal.  On continuous 2 Liters [place]   GI: Soft. He exhibits distension.  Per pt, no change in distention   Musculoskeletal: Normal range of motion. He exhibits no edema.  Neurological: He is alert and oriented to person, place, and time.  Skin: Skin is warm and dry.  Psychiatric: He has a normal mood and affect. His behavior is normal. Judgment and thought content normal.    Encounter Medications:   Outpatient Encounter Prescriptions as of 11/13/2016  Medication Sig Note  . albuterol (PROVENTIL HFA;VENTOLIN HFA) 108 (90 Base) MCG/ACT inhaler Inhale 4-6 puffs into the lungs every 4 (four) hours as needed for wheezing or shortness of breath. Reported on 04/08/2016 11/13/2016: As needed.   Marland Kitchen allopurinol (ZYLOPRIM) 100 MG tablet THREE (3) TABLETS TWICE DAILY   . ALPRAZolam (XANAX) 1 MG tablet Take 0.5-1 tablets (0.5-1 mg total) by mouth 3 (three) times daily as  needed for anxiety. 11/13/2016: As needed.   Marland Kitchen amLODipine (NORVASC) 10 MG tablet Take 10 mg by mouth daily.   Marland Kitchen apixaban (ELIQUIS) 5 MG TABS tablet Take 5 mg by mouth 2 (two) times daily.   Marland Kitchen aspirin 81 MG EC tablet Take 81 mg by mouth daily. Swallow whole. 10/29/2016: Took 2 today  . atorvastatin (LIPITOR) 80 MG tablet TAKE 1 TABLET BY MOUTH EVERY NIGHT AT BEDTIME.   . cetirizine (ZYRTEC) 10 MG tablet TAKE ONE TABLET AT BEDTIME   . clopidogrel (PLAVIX) 75 MG tablet Take 1 tablet (75 mg total) by mouth daily with breakfast. 11/13/2016: Pt takes at night   . cyclobenzaprine (FLEXERIL) 10 MG tablet TAKE 1 TABLET BY MOUTH EVERY 8 HOURS AS NEEEDED FOR MUSCLE SPASM 11/13/2016: As needed.   . docusate sodium (COLACE) 100 MG capsule Take 100 mg by mouth 2 (two) times daily. 11/13/2016: Take 2 capsules once a day   . ferrous sulfate 325 (65 FE) MG tablet Take 325 mg by mouth daily with breakfast.   . fluticasone furoate-vilanterol (BREO ELLIPTA) 100-25 MCG/INH AEPB Inhale 1 puff into the lungs daily.  09/08/2016: Pt to take once finished with Symbicort   . glucose blood (ACCU-CHEK AVIVA PLUS) test strip Use to check blood sugar once a day   . isosorbide mononitrate (IMDUR) 60 MG 24 hr tablet Take 1 tablet (60 mg total) by mouth  daily.   . lansoprazole (PREVACID) 30 MG capsule Take 30 mg by mouth 2 (two) times daily.    . magnesium oxide (MAG-OX) 400 (241.3 Mg) MG tablet Take 400 mg by mouth daily.   . metFORMIN (GLUCOPHAGE) 500 MG tablet TAKE 1 TABLET BY MOUTH EVERY MORNING. 10/07/2016: Repeat   . metoprolol tartrate (LOPRESSOR) 25 MG tablet Take 0.5 tablets (12.5 mg total) by mouth 2 (two) times daily. (Patient taking differently: Take 25 mg by mouth daily. ) 10/07/2016: Pt taking differently 25 mg once a day   . nitroGLYCERIN (NITROSTAT) 0.4 MG SL tablet Place 1 tablet (0.4 mg total) under the tongue every 5 (five) minutes as needed for chest pain. Reported on 05/26/2016 11/13/2016: Available if needed.    Marland Kitchen omega-3 acid ethyl esters (LOVAZA) 1 g capsule TAKE FOUR CAPSULES BY MOUTH DAILY   . oxyCODONE-acetaminophen (PERCOCET) 10-325 MG tablet Take 1 tablet by mouth every 4 (four) hours as needed. 11/13/2016: As needed.   . ranolazine (RANEXA) 1000 MG SR tablet Take 1 tablet (1,000 mg total) by mouth 2 (two) times daily.   . sotalol (BETAPACE) 120 MG tablet Take 120 mg by mouth daily. 09/08/2016: Pt taking twice a day   . sucralfate (CARAFATE) 1 g tablet Take 1 tablet (1 g total) by mouth 2 (two) times daily.   . tamsulosin (FLOMAX) 0.4 MG CAPS capsule Take 0.4 mg by mouth daily after breakfast.    . tiotropium (SPIRIVA) 18 MCG inhalation capsule Place 18 mcg into inhaler and inhale daily.   Marland Kitchen torsemide (DEMADEX) 100 MG tablet Take 100 mg by mouth daily.   Marland Kitchen RANEXA 500 MG 12 hr tablet TAKE TWO (2) TABLETS TWICE A DAY (Patient not taking: Reported on 11/13/2016) 11/06/2016: Per pt, taking the  Ranexa 1000 mg twice a day    No facility-administered encounter medications on file as of 11/13/2016.     Functional Status:   In your present state of health, do you have any difficulty performing the following activities: 10/29/2016 10/27/2016  Hearing? N N  Vision? N N  Difficulty concentrating or making decisions? N N  Walking or climbing stairs? Y Y  Dressing or bathing? N N  Doing errands, shopping? N N  Preparing Food and eating ? - N  Using the Toilet? - N  In the past six months, have you accidently leaked urine? - N  Do you have problems with loss of bowel control? - N  Managing your Medications? - N  Managing your Finances? - N  Housekeeping or managing your Housekeeping? - N  Some recent data might be hidden    Fall/Depression Screening:    PHQ 2/9 Scores 10/27/2016 04/16/2016 09/06/2015  PHQ - 2 Score 0 0 0  PHQ- 9 Score - - 2    Assessment:  Pleasant 60 year old gentleman.  Visit done in motel room divided into 2 smaller  Rooms- one for pt/spouse, one for 3  grandchildren/son/son's girlfriend.   CAD:  Lungs clear, no c/o pain. Vital signs stable.   No change in abdominal  Distention.   HF- no  Edema, no c/o sob at rest.   Pt not weighing, scale in storage, to ask Heart MD for scale  At upcoming office visit.   COPD: on continuous O 2 2 liters [place].  Pt waiting for Dr. Marella Bile office to call, schedule visit- cancelled Because pt was hospitalized.     Plan:  Plan to continue to follow pt for transition of  care, follow up again next week telephonically.              Pt to f/u with Dr. [name] 1/5.            Pt to call RN CM- confirm received scale.              Pt to f/u with Dr. [name] 1/4.    [facility] CM Care Plan Problem Two   Flowsheet Row Most Recent Value  Care Plan Problem Two  Risk for readmission related to recent hospitalization - chest pain.   Role Documenting the Problem Two  Care Management [place] for Problem Two  Active  Interventions for Problem Two Long Term Goal   Home visit done(part of transition of care), discussed adherence to Low Na+ diet, avoid very cold weather with recent stent.    [facility] Long Term Goal (31-90) days  Pt would not readmit within the next 31 days   [facility] Long Term Goal Start Date  11/06/16  [facility] CM Short Term Goal #2 (0-30 days)  Pt would purchase scale, start weighing within the next 30 days   [facility] CM Short Term Goal #2 Start Date  11/06/16  Interventions for Short Term Goal #2  Reinforced with pt to ask Heart MD for scale (was offered in the past) at upcoming office visit.      Zara Chess.   [place] Care Management  [phone]

## 2016-11-18 ENCOUNTER — Ambulatory Visit: Payer: Self-pay | Admitting: *Deleted

## 2016-11-19 ENCOUNTER — Other Ambulatory Visit: Payer: Self-pay | Admitting: *Deleted

## 2016-11-19 ENCOUNTER — Inpatient Hospital Stay: Payer: Commercial Managed Care - HMO | Admitting: Family Medicine

## 2016-11-19 NOTE — Patient Outreach (Signed)
Attempt made to contact pt, discuss providing scale.   HIPAA compliant voice message left with contact name and number.   Plan:  If no response, plan to follow up again tomorrow (scheduled transition of care call).    Zara Chess.   [place] Care Management  [phone]

## 2016-11-20 ENCOUNTER — Other Ambulatory Visit: Payer: Self-pay | Admitting: *Deleted

## 2016-11-20 DIAGNOSIS — I480 Paroxysmal atrial fibrillation: Secondary | ICD-10-CM | POA: Diagnosis not present

## 2016-11-20 DIAGNOSIS — R0602 Shortness of breath: Secondary | ICD-10-CM | POA: Diagnosis not present

## 2016-11-20 DIAGNOSIS — I214 Non-ST elevation (NSTEMI) myocardial infarction: Secondary | ICD-10-CM | POA: Diagnosis not present

## 2016-11-20 DIAGNOSIS — I2510 Atherosclerotic heart disease of native coronary artery without angina pectoris: Secondary | ICD-10-CM | POA: Diagnosis not present

## 2016-11-20 NOTE — Patient Outreach (Signed)
Transition of care call successful, ongoing follow up on recent hospitalization 12/14-12/19 chest pain, left heart catheterization 12/18.   Spoke with pt, HIPAA/identity verified.  Pt reports saw Dr. [name] today, was informed by MD artery stent was 75% blocked.  Pt reports MD put him back on Metoprolol 12.5 mg as  informed MD he ran out of the medication, prescription called into his pharmacy which he is going to pick up today.  Pt reports he did talk to MD's nurse about getting a scale to which she will bring it up to MD.   RN CM discussed with pt [facility] can provide a scale, drop ship to current living address to which pt agreed.  Reinforced with pt importance of weighing daily,recording, calling MD for a weight gain of >2 lbs in a day, 5 lbs in a week to which pt voiced understanding. Pt reports weight today at MD office visit was 240.8 lbs, currently no swelling, stays sob.  Pt reports did not see Dr. [name] yesterday, cancelled because of the weather, plan to call and reschedule.    Plan:  RN CM to follow up again next week telephonically (part of ongoing transition of care).     Zara Chess.   [place] Care Management  [phone]

## 2016-11-26 ENCOUNTER — Other Ambulatory Visit: Payer: Self-pay | Admitting: Family Medicine

## 2016-11-26 ENCOUNTER — Ambulatory Visit: Payer: Self-pay | Admitting: *Deleted

## 2016-11-26 MED ORDER — GLUCOSE BLOOD VI STRP: ORAL_STRIP | 4 refills | Status: DC

## 2016-11-26 NOTE — Telephone Encounter (Signed)
Pt needs refill on his accu chek test strips.  He ask for refill also.  He uses [facility]   His call back is [phone]  Thanks Con Memos

## 2016-11-26 NOTE — Telephone Encounter (Signed)
Please review. Thanks!  

## 2016-11-27 ENCOUNTER — Other Ambulatory Visit: Payer: Self-pay | Admitting: *Deleted

## 2016-11-27 NOTE — Patient Outreach (Signed)
Received a return phone call from pt to voice message left earlier.  Spoke with pt, HIPAA/identity verified, discussed purpose of call, ongoing follow up on recent hospitalization 12/14-12/19 chest pain, left heart catheterization 12/18.  Pt reports doing good, got scale yesterday, set it up this am, weight today 240 lbs.   Pt reports breathing not good, plan to call Lung MD next week, get appointment/work him in.  Pt reports called Dr. Maralyn Sago office, paper faxed to get new diabetic test strips but did not reschedule MD office visit, will do so  after visit  with Heart MD.   Pt reports no chest pain, to f/u with Dr. [name] 2/2.   RN CM reinforced with pt to weigh daily,record, call MD for weight gain of >2 lbs in a day, 5 lbs in a week, to bring weight results to upcoming MD visit.  Pt voiced understanding.  Pt reports he is to stop Aspirin 1/18, continue with Plavix and Eliquis.   Plan:  As discussed with pt, plan to follow up again next week telephonically (part of ongoing transition of care).            Care plan updated.    Zara Chess.   [place] Care Management  [phone]

## 2016-11-27 NOTE — Patient Outreach (Signed)
Attempt made to contact pt for transition of care, ongoing follow up on recent hospitalization 12/14 -12/19 chest pain, left heart catheterization 12/18.  HIPAA compliant voice message left with contact number.  Plan: if no response, will try again next week telephonically.    Zara Chess.   [place] Care Management  [phone]

## 2016-11-30 ENCOUNTER — Emergency Department: Payer: Medicare HMO

## 2016-11-30 ENCOUNTER — Emergency Department
Admission: EM | Admit: 2016-11-30 | Discharge: 2016-11-30 | Disposition: A | Discharge status: Home / Self Care | Payer: Medicare HMO | Attending: Emergency Medicine | Admitting: Emergency Medicine

## 2016-11-30 ENCOUNTER — Encounter: Payer: Self-pay | Admitting: Emergency Medicine

## 2016-11-30 DIAGNOSIS — J449 Chronic obstructive pulmonary disease, unspecified: Secondary | ICD-10-CM | POA: Diagnosis not present

## 2016-11-30 DIAGNOSIS — I2510 Atherosclerotic heart disease of native coronary artery without angina pectoris: Secondary | ICD-10-CM | POA: Insufficient documentation

## 2016-11-30 DIAGNOSIS — Z87891 Personal history of nicotine dependence: Secondary | ICD-10-CM | POA: Insufficient documentation

## 2016-11-30 DIAGNOSIS — I509 Heart failure, unspecified: Secondary | ICD-10-CM | POA: Insufficient documentation

## 2016-11-30 DIAGNOSIS — Z7982 Long term (current) use of aspirin: Secondary | ICD-10-CM | POA: Insufficient documentation

## 2016-11-30 DIAGNOSIS — I110 Hypertensive heart disease with heart failure: Secondary | ICD-10-CM | POA: Insufficient documentation

## 2016-11-30 DIAGNOSIS — E119 Type 2 diabetes mellitus without complications: Secondary | ICD-10-CM | POA: Diagnosis not present

## 2016-11-30 DIAGNOSIS — Z79899 Other long term (current) drug therapy: Secondary | ICD-10-CM | POA: Diagnosis not present

## 2016-11-30 DIAGNOSIS — Z7984 Long term (current) use of oral hypoglycemic drugs: Secondary | ICD-10-CM | POA: Diagnosis not present

## 2016-11-30 DIAGNOSIS — J45909 Unspecified asthma, uncomplicated: Secondary | ICD-10-CM | POA: Insufficient documentation

## 2016-11-30 DIAGNOSIS — J09X2 Influenza due to identified novel influenza A virus with other respiratory manifestations: Secondary | ICD-10-CM | POA: Diagnosis not present

## 2016-11-30 DIAGNOSIS — R05 Cough: Secondary | ICD-10-CM | POA: Diagnosis not present

## 2016-11-30 DIAGNOSIS — J101 Influenza due to other identified influenza virus with other respiratory manifestations: Secondary | ICD-10-CM

## 2016-11-30 LAB — INFLUENZA PANEL BY PCR (TYPE A & B)
Influenza A By PCR: POSITIVE — AB
Influenza B By PCR: NEGATIVE

## 2016-11-30 MED ORDER — BENZONATATE 200 MG PO CAPS: 200.0000 mg | ORAL_CAPSULE | Freq: Three times a day (TID) | ORAL | 0 refills | Status: DC | PRN

## 2016-11-30 MED ORDER — OSELTAMIVIR PHOSPHATE 75 MG PO CAPS: 75.0000 mg | ORAL_CAPSULE | Freq: Two times a day (BID) | ORAL | 0 refills | Status: AC

## 2016-11-30 MED ORDER — BENZONATATE 100 MG PO CAPS
200.0000 mg | ORAL_CAPSULE | Freq: Once | ORAL | Status: AC
  Administered 2016-11-30: 200 mg via ORAL
  Filled 2016-11-30: qty 2

## 2016-11-30 NOTE — ED Provider Notes (Signed)
[facility] Emergency Department Provider Note   ____________________________________________   First MD Initiated Contact with Patient 11/30/16 1607     (approximate)  I have reviewed the triage vital signs and the nursing notes.   HISTORY  Chief Complaint Cough    HPI Lawrence [name] is a 61 y.o. male patient complaining of 3 days productive cough and possible fever. Patient states chest discomfort secondary to cough. Patient has a history of COPD. Patient state has taken a flu shot this season. Patient state wife with similar complaints. Wife has not taken a flu shot. Patient rates his pain discomfort as a 7/10. Patient describes pain as "achy". No palliative measures for this complaint.   Past Medical History:  Diagnosis Date  . A-fib ([place])   . Anemia   . Anxiety   . Asthma   . CAD (coronary artery disease)    a. 2002 CABGx2 (LIMA->LAD, VG->VG->OM1);  b. 09/2012 DES->OM;  c. 03/2015 PTCA of LAD [facility]) in setting of atretic LIMA; d. 05/2015 Cath [facility]): nonobs dzs; e. 06/2015 Cath ([facility]): LM nl, LAD 45p/d ISR, 50d, D1/2 small, LCX 50p/d ISR, OM1 70ost, 30 ISR, VG->OM1 50ost, 25m LIMA->LAD 99p/d - atretic, RCA dom, nl; f.cath 10/16: 40-50%(FFR 0.90) pLAD, 75% (FFR 0.77) mLAD s/p PCI/DES, oRCA 40% (FFR0.95)  . Celiac disease   . Chronic diastolic CHF (congestive heart failure) ([place]    a. 06/2009 Echo: EF 60-65%, Gr 1 DD, triv AI, mildly dil LA, nl RV.  .Marland KitchenCOPD (chronic obstructive pulmonary disease) ([place]    a. Chronic bronchitis and emphysema.  .Marland KitchenDysrhythmia   . Essential hypertension   . History of tobacco abuse    a. Quit 2014.  .Marland KitchenPSVT (paroxysmal supraventricular tachycardia) ([place]    a. 10/2012 Noted on Zio Patch.  . Type II diabetes mellitus ([facility]     Patient Active Problem List   Diagnosis Date Noted  . Dyspnea 04/03/2016  . Hypotension 04/03/2016  . Chronic kidney disease 04/03/2016  . Anemia 04/03/2016  . Paroxysmal atrial fibrillation  ([place] 12/23/2015  . OSA (obstructive sleep apnea) 12/10/2015  . Left inguinal hernia 11/07/2015  . Anxiety 11/07/2015  . Unstable angina ([place] 10/05/2015  . PAF (paroxysmal atrial fibrillation) ([place] 10/05/2015  . Back pain with left-sided radiculopathy 09/30/2015  . Nocturnal hypoxia 09/06/2015  . BPH (benign prostatic hyperplasia) 08/01/2015  . Chronic diastolic CHF (congestive heart failure) ([place]   . Angina pectoris ([place]   . Chest pain 07/11/2015  . COPD (chronic obstructive pulmonary disease) ([place] 07/03/2015  . CAD (coronary artery disease) 06/26/2015  . HTN (hypertension) 06/26/2015  . DM (diabetes mellitus) ([place] 06/26/2015  . Achalasia 07/24/2014  . GERD (gastroesophageal reflux disease) 06/07/2014  . Former tobacco use 04/11/2013  . HLD (hyperlipidemia) 04/09/2013    Past Surgical History:  Procedure Laterality Date  . BYPASS GRAFT    . CARDIAC CATHETERIZATION N/A 07/12/2015   rocedure: Left Heart Cath and Cors/Grafts Angiography;  Surgeon: HBelva Crome MD;  [facility];  Service: Cardiovascular;  Laterality: N/A;  . CARDIAC CATHETERIZATION Right 10/07/2015   Procedure: Left Heart Cath and Cors/Grafts Angiography;  Surgeon: SDionisio David MD;  Location: [facility];  Service: Cardiovascular;  Laterality: Right;  . CARDIAC CATHETERIZATION N/A 04/06/2016   Procedure: Left Heart Cath and Coronary Angiography;  Surgeon: DYolonda Kida MD;  [place];  Service: Cardiovascular;  Laterality: N/A;  . CARDIAC CATHETERIZATION  04/06/2016  Procedure: Bypass Graft Angiography;  Surgeon: Yolonda Kida, MD;  Location: [place] CV LAB;  Service: Cardiovascular;;  . CARDIAC CATHETERIZATION N/A 11/02/2016   Procedure: Left Heart Cath and Cors/Grafts Angiography and possible PCI;  Surgeon: Yolonda Kida, MD;  Location: [place] CV LAB;  Service: Cardiovascular;  Laterality: N/A;  . CARDIAC CATHETERIZATION N/A 11/02/2016    Procedure: Coronary Stent Intervention;  Surgeon: Yolonda Kida, MD;  Location: [place] CV LAB;  Service: Cardiovascular;  Laterality: N/A;  . CHOLECYSTECTOMY    . ESOPHAGEAL DILATION    . TONSILLECTOMY    . VASCULAR SURGERY      Prior to Admission medications   Medication Sig Start Date End Date Taking? Authorizing Provider  albuterol (PROVENTIL HFA;VENTOLIN HFA) 108 (90 Base) MCG/ACT inhaler Inhale 4-6 puffs into the lungs every 4 (four) hours as needed for wheezing or shortness of breath. Reported on 04/08/2016    Historical Provider, MD  allopurinol (ZYLOPRIM) 100 MG tablet THREE (3) TABLETS TWICE DAILY 04/25/16   Birdie Sons, MD  ALPRAZolam Duanne Moron) 1 MG tablet Take 0.5-1 tablets (0.5-1 mg total) by mouth 3 (three) times daily as needed for anxiety. 05/26/16   Birdie Sons, MD  amLODipine (NORVASC) 10 MG tablet Take 10 mg by mouth daily.    Historical Provider, MD  apixaban (ELIQUIS) 5 MG TABS tablet Take 5 mg by mouth 2 (two) times daily.    Historical Provider, MD  aspirin 81 MG EC tablet Take 81 mg by mouth daily. Swallow whole.    Historical Provider, MD  atorvastatin (LIPITOR) 80 MG tablet TAKE 1 TABLET BY MOUTH EVERY NIGHT AT BEDTIME. 05/26/16   Birdie Sons, MD  benzonatate (TESSALON) 200 MG capsule Take 1 capsule (200 mg total) by mouth 3 (three) times daily as needed for cough. 11/30/16   Sable Feil, PA-C  cetirizine (ZYRTEC) 10 MG tablet TAKE ONE TABLET AT BEDTIME 04/19/16   Birdie Sons, MD  clopidogrel (PLAVIX) 75 MG tablet Take 1 tablet (75 mg total) by mouth daily with breakfast. 11/04/16   Henreitta Leber, MD  cyclobenzaprine (FLEXERIL) 10 MG tablet TAKE 1 TABLET BY MOUTH EVERY 8 HOURS AS NEEEDED FOR MUSCLE SPASM 07/25/16   Birdie Sons, MD  docusate sodium (COLACE) 100 MG capsule Take 100 mg by mouth 2 (two) times daily.    Historical Provider, MD  ferrous sulfate 325 (65 FE) MG tablet Take 325 mg by mouth daily with breakfast. 08/03/16 08/03/17   Historical Provider, MD  fluticasone furoate-vilanterol (BREO ELLIPTA) 100-25 MCG/INH AEPB Inhale 1 puff into the lungs daily.     Historical Provider, MD  glucose blood (ACCU-CHEK AVIVA PLUS) test strip Use to check blood sugar once a day 11/26/16   Birdie Sons, MD  isosorbide mononitrate (IMDUR) 60 MG 24 hr tablet Take 1 tablet (60 mg total) by mouth daily. 11/04/16   Henreitta Leber, MD  lansoprazole (PREVACID) 30 MG capsule Take 30 mg by mouth 2 (two) times daily.     Historical Provider, MD  magnesium oxide (MAG-OX) 400 (241.3 Mg) MG tablet Take 400 mg by mouth daily.    Historical Provider, MD  metFORMIN (GLUCOPHAGE) 500 MG tablet TAKE 1 TABLET BY MOUTH EVERY MORNING. 10/05/16   Birdie Sons, MD  metoprolol tartrate (LOPRESSOR) 25 MG tablet Take 0.5 tablets (12.5 mg total) by mouth 2 (two) times daily. Patient taking differently: Take 25 mg by mouth daily.  06/26/16   Snehalatha  Vianne Bulls, MD  nitroGLYCERIN (NITROSTAT) 0.4 MG SL tablet Place 1 tablet (0.4 mg total) under the tongue every 5 (five) minutes as needed for chest pain. Reported on 05/26/2016 10/06/16   Birdie Sons, MD  omega-3 acid ethyl esters (LOVAZA) 1 g capsule TAKE FOUR CAPSULES BY MOUTH DAILY 07/25/16   Birdie Sons, MD  oseltamivir (TAMIFLU) 75 MG capsule Take 1 capsule (75 mg total) by mouth 2 (two) times daily. 11/30/16 12/05/16  Sable Feil, PA-C  oxyCODONE-acetaminophen (PERCOCET) 10-325 MG tablet Take 1 tablet by mouth every 4 (four) hours as needed. 11/06/16   Birdie Sons, MD  RANEXA 500 MG 12 hr tablet TAKE TWO (2) TABLETS TWICE A DAY Patient not taking: Reported on 11/13/2016 07/25/16   Birdie Sons, MD  ranolazine (RANEXA) 1000 MG SR tablet Take 1 tablet (1,000 mg total) by mouth 2 (two) times daily. 11/03/16   Henreitta Leber, MD  sotalol (BETAPACE) 120 MG tablet Take 120 mg by mouth daily.    Historical Provider, MD  sucralfate (CARAFATE) 1 g tablet Take 1 tablet (1 g total) by mouth 2 (two) times  daily. 04/22/16   [name], MD  tamsulosin (FLOMAX) 0.4 MG CAPS capsule Take 0.4 mg by mouth daily after breakfast.     Historical Provider, MD  tiotropium (SPIRIVA) 18 MCG inhalation capsule Place 18 mcg into inhaler and inhale daily.    Historical Provider, MD  torsemide (DEMADEX) 100 MG tablet Take 100 mg by mouth daily.    Historical Provider, MD    Allergies Demerol [meperidine]; Prednisone; Sulfa antibiotics; Albuterol sulfate [albuterol]; and Morphine sulfate  Family History  Problem Relation Age of Onset  . Heart attack Mother   . Depression Mother   . Heart disease Mother   . COPD Mother   . Hypertension Mother   . Heart attack Father   . Diabetes Father   . Depression Father   . Heart disease Father   . Cirrhosis Father   . Parkinson's disease Brother     Social History Social History  Substance Use Topics  . Smoking status: Former Smoker    Quit date: 04/22/2013  . Smokeless tobacco: Never Used  . Alcohol use No     Comment: remotely quit alcohol use. Hx of heavy alcohol use.    Review of Systems Constitutional: No fever/chills. Headache and body ache Eyes: No visual changes. ENT: No sore throat. Cardiovascular: Denies chest pain. Respiratory: Denies shortness of breath.Nonproductive cough Gastrointestinal: No abdominal pain.  No nausea, no vomiting.  No diarrhea.  No constipation. Genitourinary: Negative for dysuria. Musculoskeletal: Chest wall pain secondary to cough Skin: Negative for rash. Neurological: Negative for headaches, focal weakness or numbness. Psychiatric:Anxiety Endocrine:Hypertension Allergic/Immunilogical: See medication list  ____________________________________________   PHYSICAL EXAM:  VITAL SIGNS: ED Triage Vitals [11/30/16 1553]  Enc Vitals Group     BP 117/69     Pulse Rate 72     Resp 20     Temp 98.5 F (36.9 C)     Temp Source Oral     SpO2 96 %     Weight 238 lb (108 kg)     Height 5' 7"  (1.702 m)     Head  Circumference      Peak Flow      Pain Score 7     Pain Loc      Pain Edu?      Excl. in [place]?     Constitutional: Alert  and oriented. Well appearing and in no acute distress. Eyes: Conjunctivae are normal. PERRL. EOMI. Head: Atraumatic. Nose: Edematous nasal turbinates clear rhinorrhea  Mouth/Throat: Mucous membranes are moist.  Oropharynx non-erythematous. Neck: No stridor.  No cervical spine tenderness to palpation. Hematological/Lymphatic/Immunilogical: No cervical lymphadenopathy. Cardiovascular: Normal rate, regular rhythm. Grossly normal heart sounds.  Good peripheral circulation. Respiratory: Normal respiratory effort.  No retractions. Lungs inspiratory Rales with nonproductive cough. Patient is on oxygen secondary to COPD Gastrointestinal: Soft and nontender. No distention. No abdominal bruits. No CVA tenderness. Musculoskeletal: No lower extremity tenderness nor edema.  No joint effusions. Neurologic:  Normal speech and language. No gross focal neurologic deficits are appreciated. No gait instability. Skin:  Skin is warm, dry and intact. No rash noted. Psychiatric: Mood and affect are normal. Speech and behavior are normal.  ____________________________________________   LABS (all labs ordered are listed, but only abnormal results are displayed)  Labs Reviewed  INFLUENZA PANEL BY PCR (TYPE A & B) - Abnormal; Notable for the following:       Result Value   Influenza A By PCR POSITIVE (*)    All other components within normal limits   ____________________________________________  EKG   ____________________________________________  RADIOLOGY  No acute findings on chest x-ray. ____________________________________________   PROCEDURES  Procedure(s) performed: None  Procedures  Critical Care performed: No  ____________________________________________   INITIAL IMPRESSION / ASSESSMENT AND PLAN / ED COURSE  Pertinent labs & imaging results that were  available during my care of the patient were reviewed by me and considered in my medical decision making (see chart for details).  Patient is positive for influenza A. Patient given discharge care instructions. Patient given a prescription for Tamiflu and Tessalon. Patient advised follow-up family doctor as needed.  Clinical Course    Patient presents with cough congestion and body aches and pains. Patient has taken a flu shot earlier in the season. Patient wife has not taken a flu shot. Discussed positive influenza A for both.  ____________________________________________   FINAL CLINICAL IMPRESSION(S) / ED DIAGNOSES  Final diagnoses:  Influenza A      NEW MEDICATIONS STARTED DURING THIS VISIT:  New Prescriptions   BENZONATATE (TESSALON) 200 MG CAPSULE    Take 1 capsule (200 mg total) by mouth 3 (three) times daily as needed for cough.   OSELTAMIVIR (TAMIFLU) 75 MG CAPSULE    Take 1 capsule (75 mg total) by mouth 2 (two) times daily.     Note:  This document was prepared using Dragon voice recognition software and may include unintentional dictation errors.    Sable Feil, PA-C 11/30/16 [place], MD 12/11/16 [phone]

## 2016-11-30 NOTE — ED Triage Notes (Signed)
Presents with a 3 day hx of prod cough and possible fever  Afebrile on arrival  Having some discomfort in chest with cough

## 2016-12-01 ENCOUNTER — Other Ambulatory Visit: Payer: Self-pay | Admitting: Family Medicine

## 2016-12-03 ENCOUNTER — Other Ambulatory Visit: Payer: Self-pay | Admitting: *Deleted

## 2016-12-03 NOTE — Patient Outreach (Signed)
Transition of care call successful, ongoing follow up on recent hospitalization 12/14-12/19 chest pain, left- heart catheterization 12/18 plus recent ED visit 1/15.  Spoke with pt, HIPAA/identity verified, discussed with pt  Recent ED visit to which pt reports both he and spouse came to ED- told had the flu.   Pt reports he is getting better, had his flu shot but spouse did not, still sick along with his grandchildren.    Pt reports continues to use cough medication, cough better.   Pt reports continues to weigh daily, weight 238 lbs, no edema, watching his sodium intake, sob same (hx of COPD).   Pt reports he has not made follow up appointments with  Lung MD or Primary Care MD - dealing with flu, scheduled to  see Dr. [name] in April.   Reviewed with pt upcoming appointment with Dr. [name] 2/2 to which pt was aware.   Discussed with pt coworker Richarda Osmond RN CM covering for this RN CM to do final transition of care call next week.  Also discussed with pt plan to refer to [facility] coach short term - self management of HF  To which pt agreed.      Zara Chess.   [place] Care Management  [phone]

## 2016-12-04 ENCOUNTER — Telehealth: Payer: Self-pay | Admitting: Family Medicine

## 2016-12-04 MED ORDER — OXYCODONE-ACETAMINOPHEN 10-325 MG PO TABS: 1.0000 | ORAL_TABLET | ORAL | 0 refills | Status: DC | PRN

## 2016-12-04 NOTE — Telephone Encounter (Signed)
Pt contacted office for refill request on the following medications:  oxyCODONE-acetaminophen (PERCOCET) 10-325 MG tablet.  BB#[phone]

## 2016-12-04 NOTE — Telephone Encounter (Signed)
Last fill 11/06/16-aa next appt 02/26/17-aa

## 2016-12-07 ENCOUNTER — Encounter: Payer: Self-pay | Admitting: *Deleted

## 2016-12-07 ENCOUNTER — Ambulatory Visit: Payer: Commercial Managed Care - HMO | Admitting: *Deleted

## 2016-12-07 ENCOUNTER — Other Ambulatory Visit: Payer: Self-pay | Admitting: *Deleted

## 2016-12-07 NOTE — Patient Outreach (Signed)
[facility]) Care Management [place] Telephone Outreach, Transition of Care   12/07/2016  Lawrence [name] [birth year] [id number]  Successful telephone outreach to Lawrence [name], 61 y/o male followed by [name] for transition of care after recent hospitalization December 14-19, 2017 for chest pain/ NSTEMI; patient has stent placement on November 02, 2016.  Patient has history including but not limited to CHF, COPD, and DM.  HIPAA/ identity verified with patient today during phone call.  Today, patient reports "everything is going pretty good."  States that he continues to experience "head congestion" from his recent cold, but denies breathing difficulty or other concerns, issues, problems.  Explained to patient that I was calling to follow up as coverage for his primary [facility] RN CM, Lawrence [name].   Weight:  Patient reports weight has "been holding steady," states he hasn't had time yet this morning to weigh, but that weights over weekend were "238 pounds every day."  Encouraged patient to continue monitoring/ recording daily weights, which he agrees to do.  Swelling:  Denies peripheral swelling.  Pain:  Denies chest pain or other concerning symptoms today.  States that he has chronic back pain, but reports that he is "used to it and it is under control."  Medications:  Reports has all medications and is taking as prescribed.  Denies questions or concerns about his medications.  Provider appointments:  States that he talked to his PCP last week, and will call PCP for any new concerns.   I confirmed that patient hasthe main [facility] CM office phone number, and the [facility] CM 24-hour nurse advice phone number should issues arise prior to next scheduled [place] RN CM outreach.    Plan:  Will update patient's primary [facility] RN CM on successful telephone outreach today.  Lawrence Rack, RN, BSN, [employer] [facility] Care Management   [phone]

## 2016-12-09 ENCOUNTER — Encounter: Payer: Self-pay | Admitting: *Deleted

## 2016-12-09 ENCOUNTER — Other Ambulatory Visit: Payer: Self-pay | Admitting: *Deleted

## 2016-12-09 IMAGING — CR DG CHEST 1V PORT
1 series · 1 of 1 positions shown · non-contrast
Comparison: Multiple prior studies

CLINICAL DATA: Initial evaluation for chest pain for 1 day

EXAM:
PORTABLE CHEST - 1 VIEW

[ap]
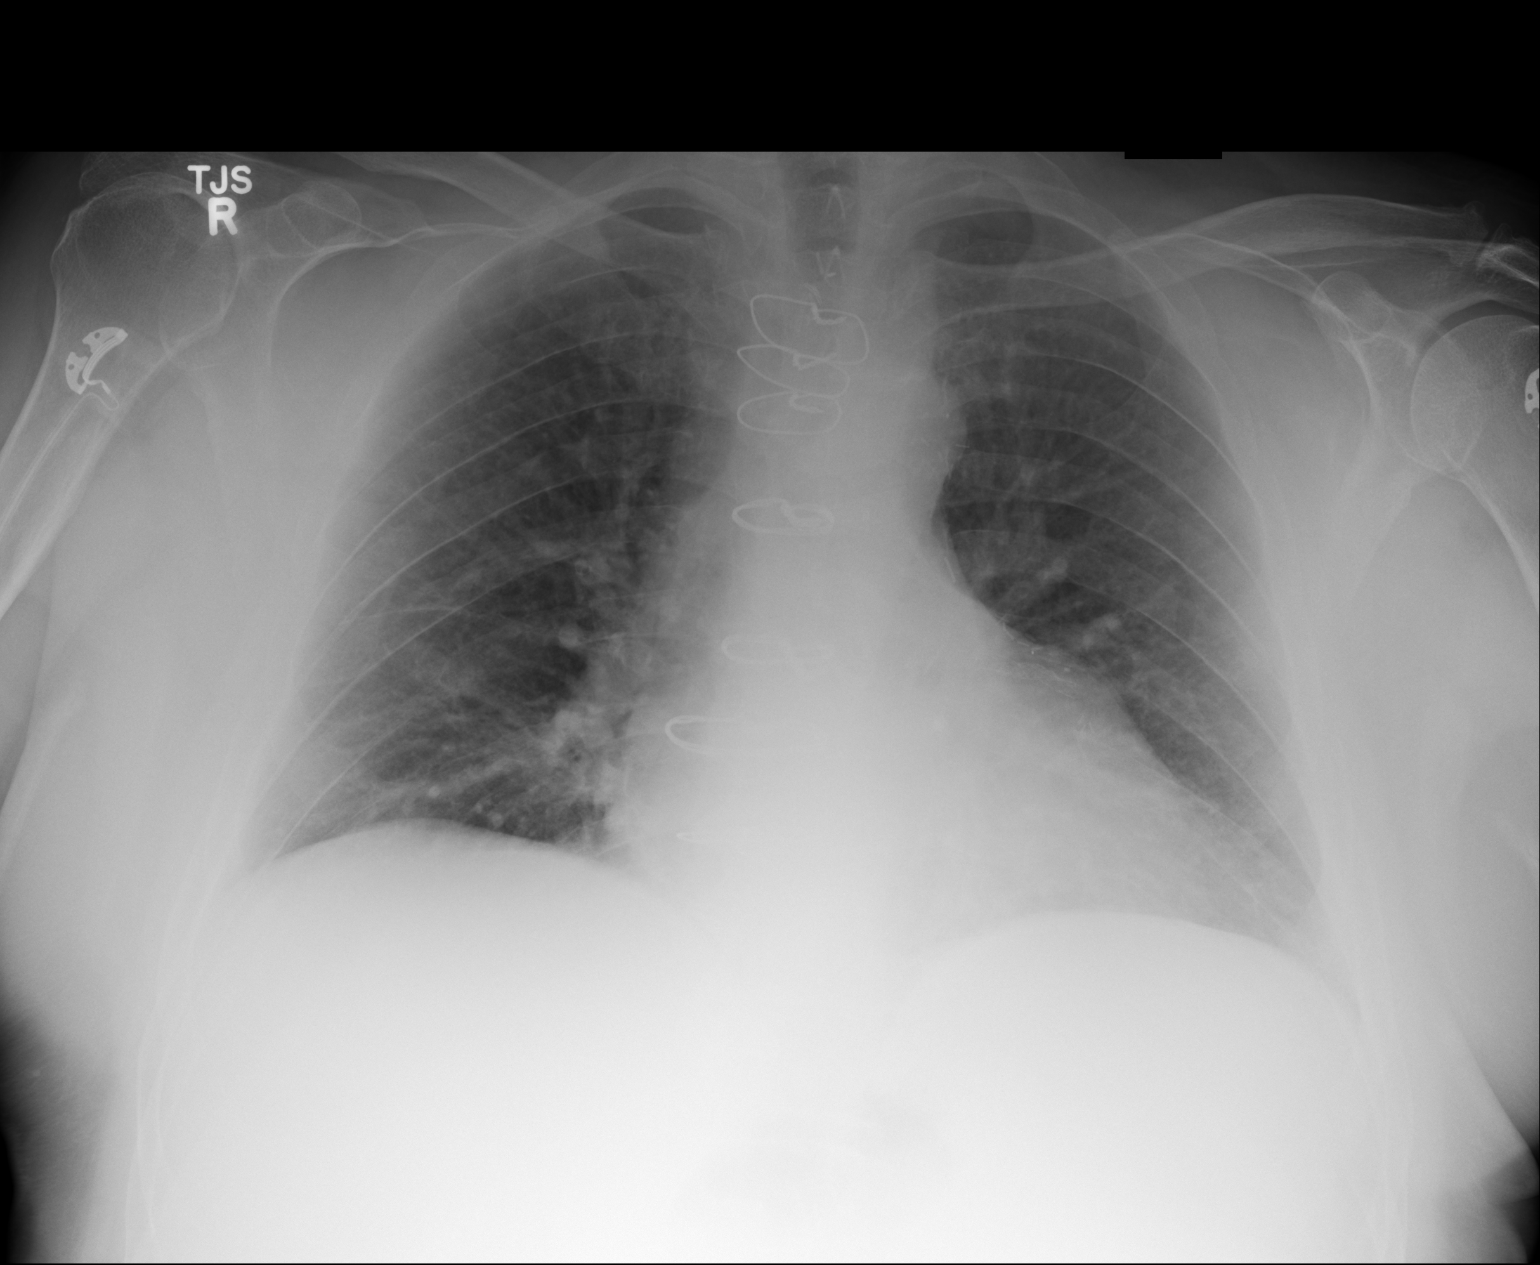

[1 of 1 positions shown; findings below may reference images not displayed]

FINDINGS: Mild cardiac enlargement. Vascular pattern normal. Bilateral
bronchitic change. No consolidation or effusion. Coronary stent
noted.
IMPRESSION: Stable findings when compared to 12/14/2014.

## 2016-12-10 ENCOUNTER — Encounter: Payer: Self-pay | Admitting: *Deleted

## 2016-12-10 DIAGNOSIS — G4733 Obstructive sleep apnea (adult) (pediatric): Secondary | ICD-10-CM | POA: Diagnosis not present

## 2016-12-12 DIAGNOSIS — G4710 Hypersomnia, unspecified: Secondary | ICD-10-CM | POA: Diagnosis not present

## 2016-12-12 DIAGNOSIS — J439 Emphysema, unspecified: Secondary | ICD-10-CM | POA: Diagnosis not present

## 2016-12-12 DIAGNOSIS — I10 Essential (primary) hypertension: Secondary | ICD-10-CM | POA: Diagnosis not present

## 2016-12-12 DIAGNOSIS — J449 Chronic obstructive pulmonary disease, unspecified: Secondary | ICD-10-CM | POA: Diagnosis not present

## 2016-12-13 IMAGING — CT CT ABD-PELV W/ CM
2 of 5 series · 17 of 46 positions shown, 19 images · IV contrast (omnipaque)
Comparison: 11/19/2014, 07/03/2011

CLINICAL DATA: Epigastric pain for 5 days.

EXAM:
CT ABDOMEN AND PELVIS WITH CONTRAST
TECHNIQUE: Multidetector CT imaging of the abdomen and pelvis was performed
using the standard protocol following bolus administration of
intravenous contrast.
CONTRAST:  100 mL Omnipaque 300 intravenous

[Series 2: routine abd pel with · axial · 0.82mm/px · z∈[-384,+26]mm · 14 of 92 slices shown, 16 images]
[im 5/92  soft-tissue]
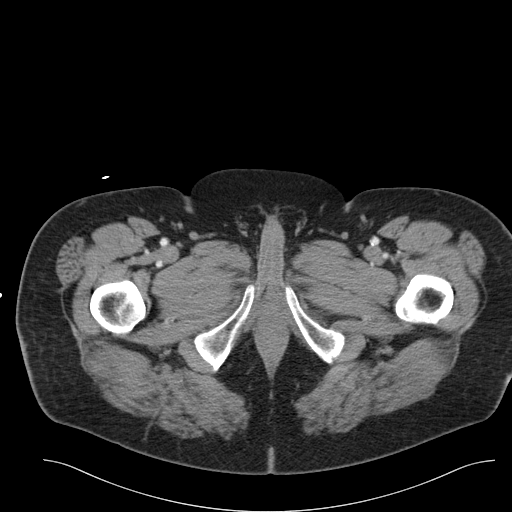
[im 5/92  bone]
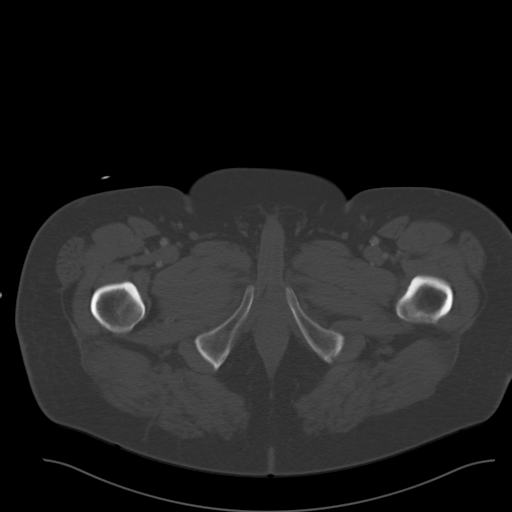
[im 10/92  soft-tissue]
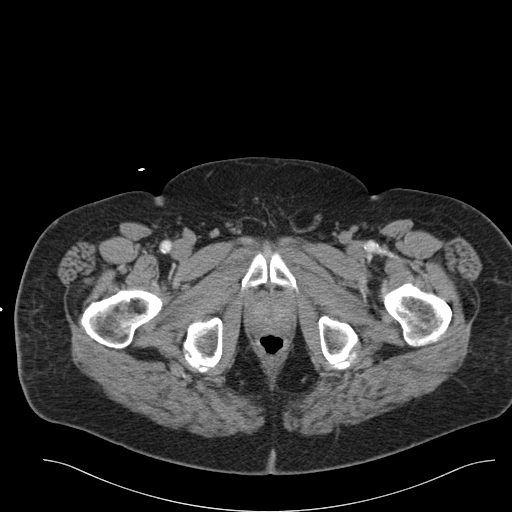
[im 20/92  soft-tissue]
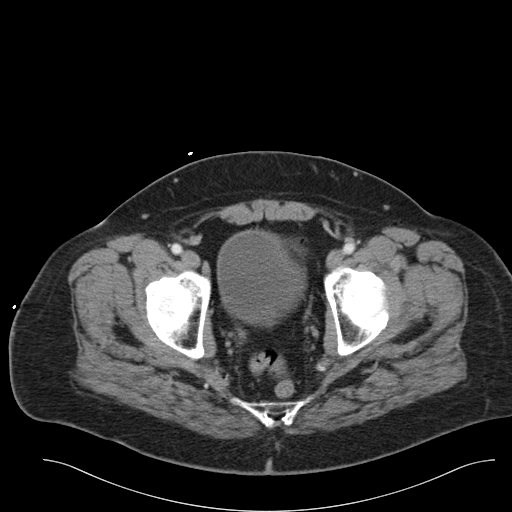
[im 24/92  soft-tissue]
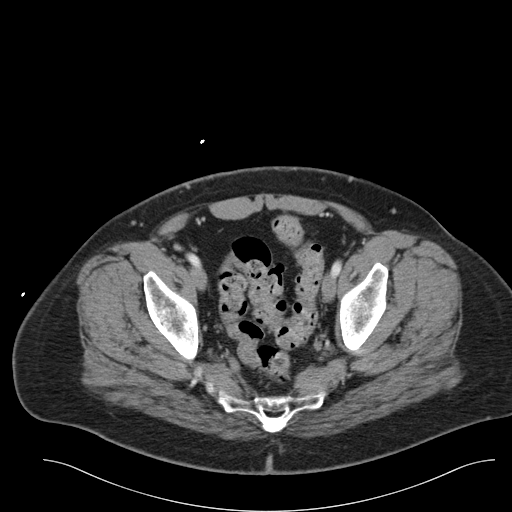
[im 29/92  soft-tissue]
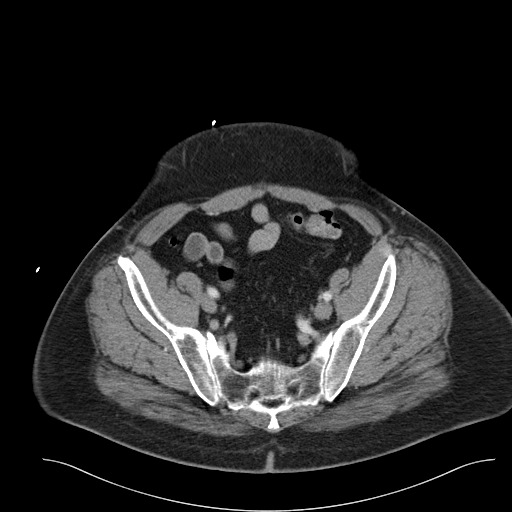
[im 39/92  soft-tissue]
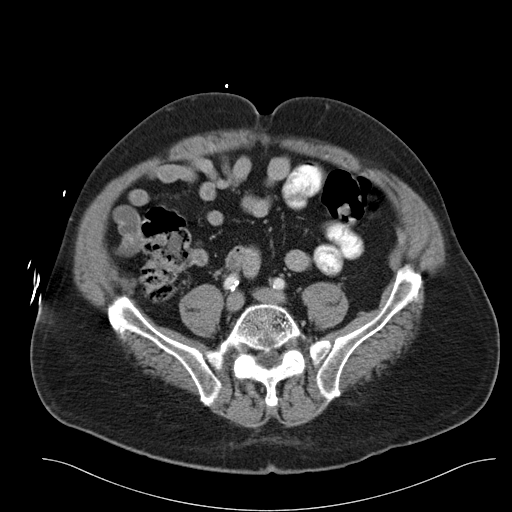
[im 44/92  soft-tissue]
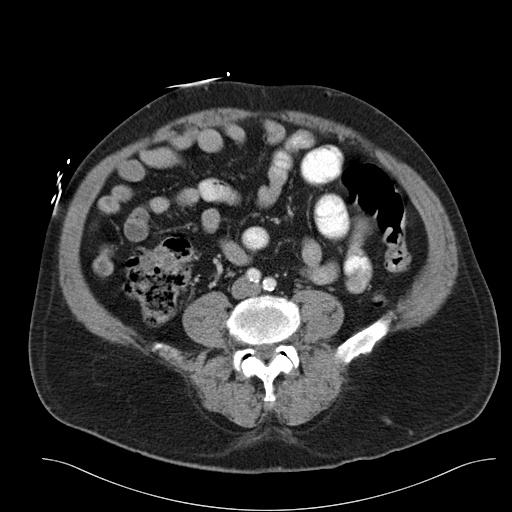
[im 48/92  soft-tissue]
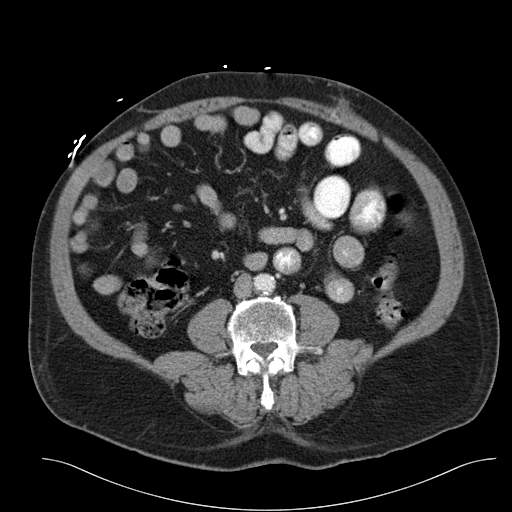
[im 53/92  soft-tissue]
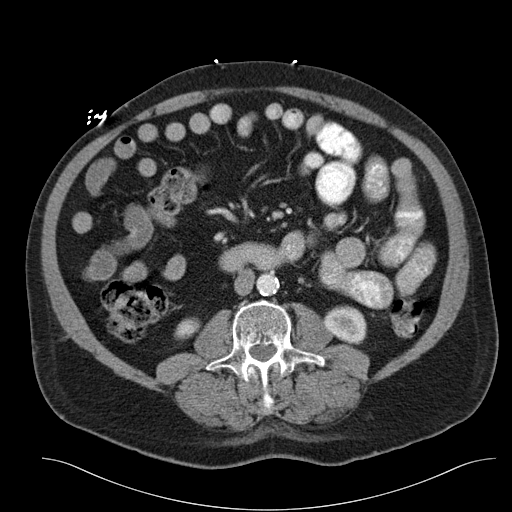
[im 53/92  bone]
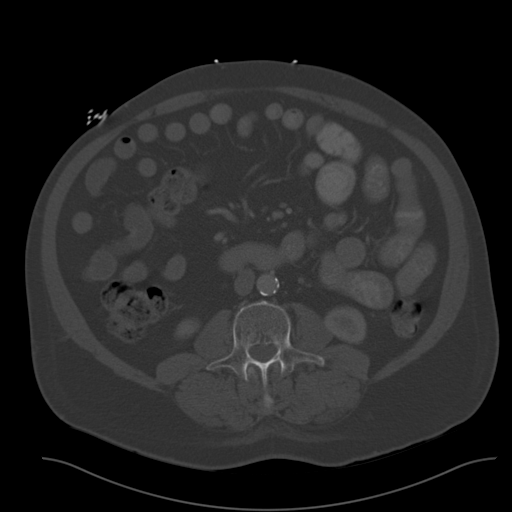
[im 63/92  soft-tissue]
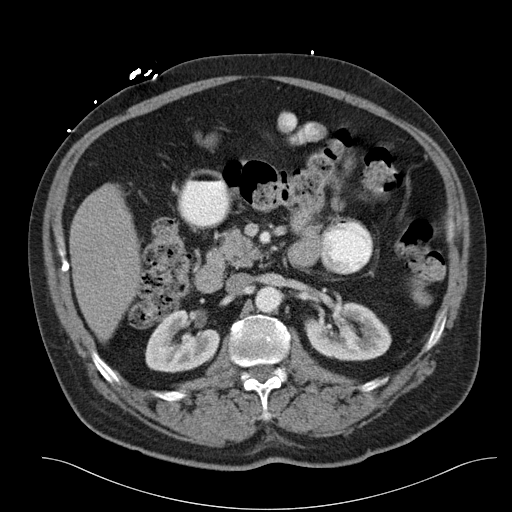
[im 68/92  soft-tissue]
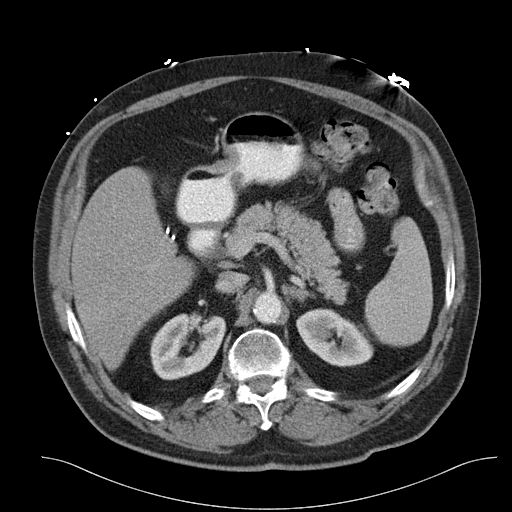
[im 72/92  soft-tissue]
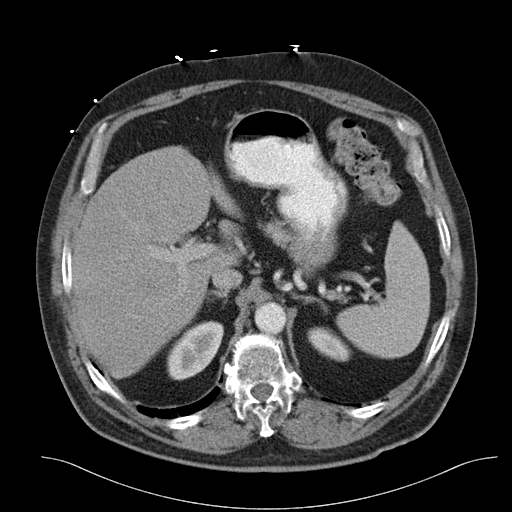
[im 82/92  soft-tissue]
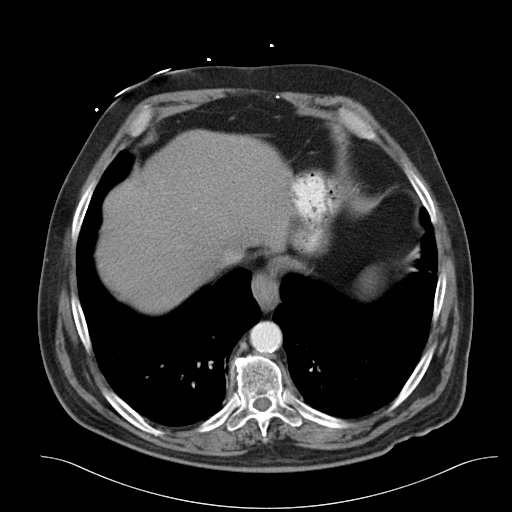
[im 87/92  soft-tissue]
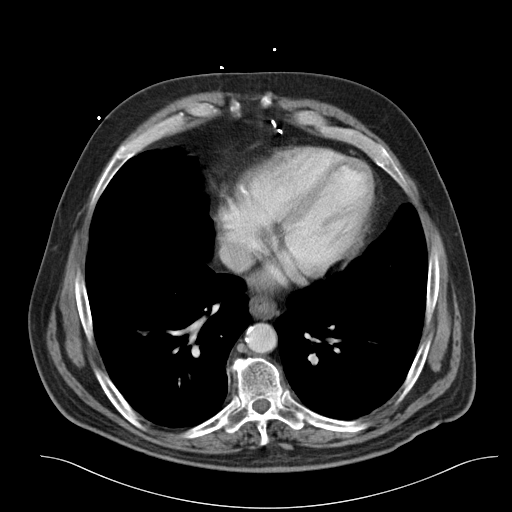

[Series 6: cor routine abd pel with · coronal · 0.89mm/px · 3 of 155 slices shown]
[im 52/155  soft-tissue]
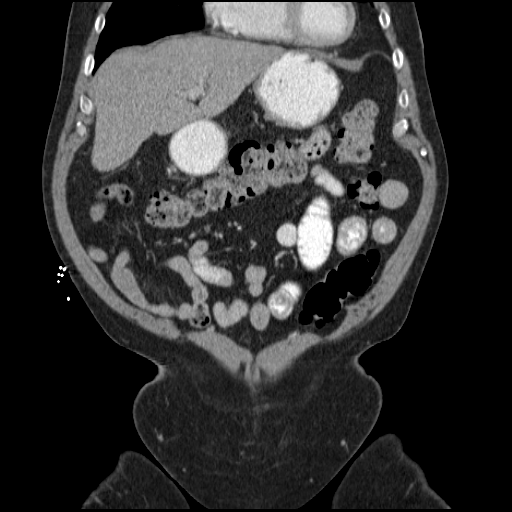
[im 69/155  soft-tissue]
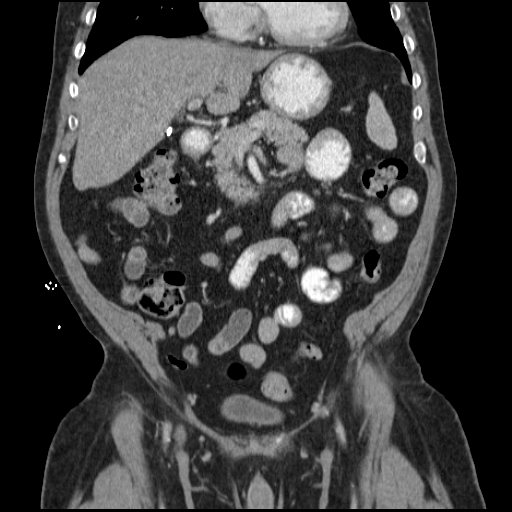
[im 86/155  soft-tissue]
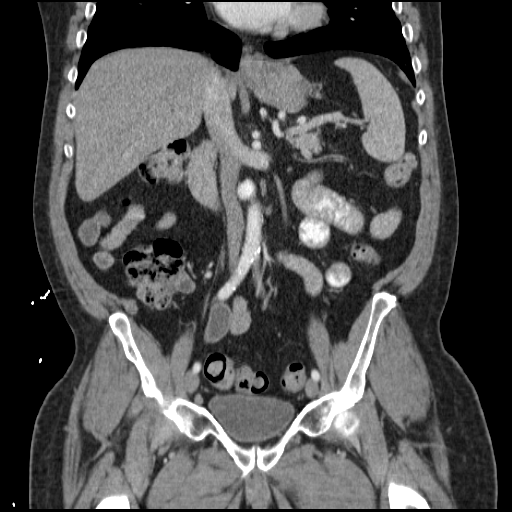

[17 of 46 positions shown; findings below may reference images not displayed]

FINDINGS: Lower chest:  No significant abnormality

Hepatobiliary: There is mild hypodensity of hepatic parenchyma
suggesting fatty infiltration. There is no focal liver lesion. There
is prior cholecystectomy. There is no bile duct dilatation.

Pancreas: Normal

Spleen: Normal

Adrenals/Urinary Tract: There is a 13 mm left adrenal nodule which
is unchanged from 07/03/2011. The right adrenal is normal. Kidneys,
collecting systems and ureters are normal. Urinary bladder is
unremarkable.

Stomach/Bowel: There is a hiatal hernia. There are otherwise normal
appearances of the stomach and small bowel. The appendix is normal.
The colon is remarkable only for mild uncomplicated diverticulosis.

Vascular/Lymphatic: The abdominal aorta is normal in caliber. There
is mild atherosclerotic calcification. There is no adenopathy in the
abdomen or pelvis.

Other: No inflammatory changes are evident in the abdomen or pelvis.
There is no ascites. There is a small fat containing left inguinal
hernia.

Musculoskeletal: No acute musculoskeletal abnormalities. There is
mild lower lumbar facet arthritis.
IMPRESSION: 1. Mild hepatic fatty infiltration
2. Benign left adrenal nodule
3. Hiatal hernia
4. No acute findings are evident in the abdomen or pelvis.

## 2016-12-15 IMAGING — CR DG CHEST 1V PORT
1 series · 1 of 1 positions shown · non-contrast
Comparison: 01/28/2015

CLINICAL DATA: Epigastric pain for 1 week.  Dyspnea and cough.

EXAM:
PORTABLE CHEST - 1 VIEW

[ap]
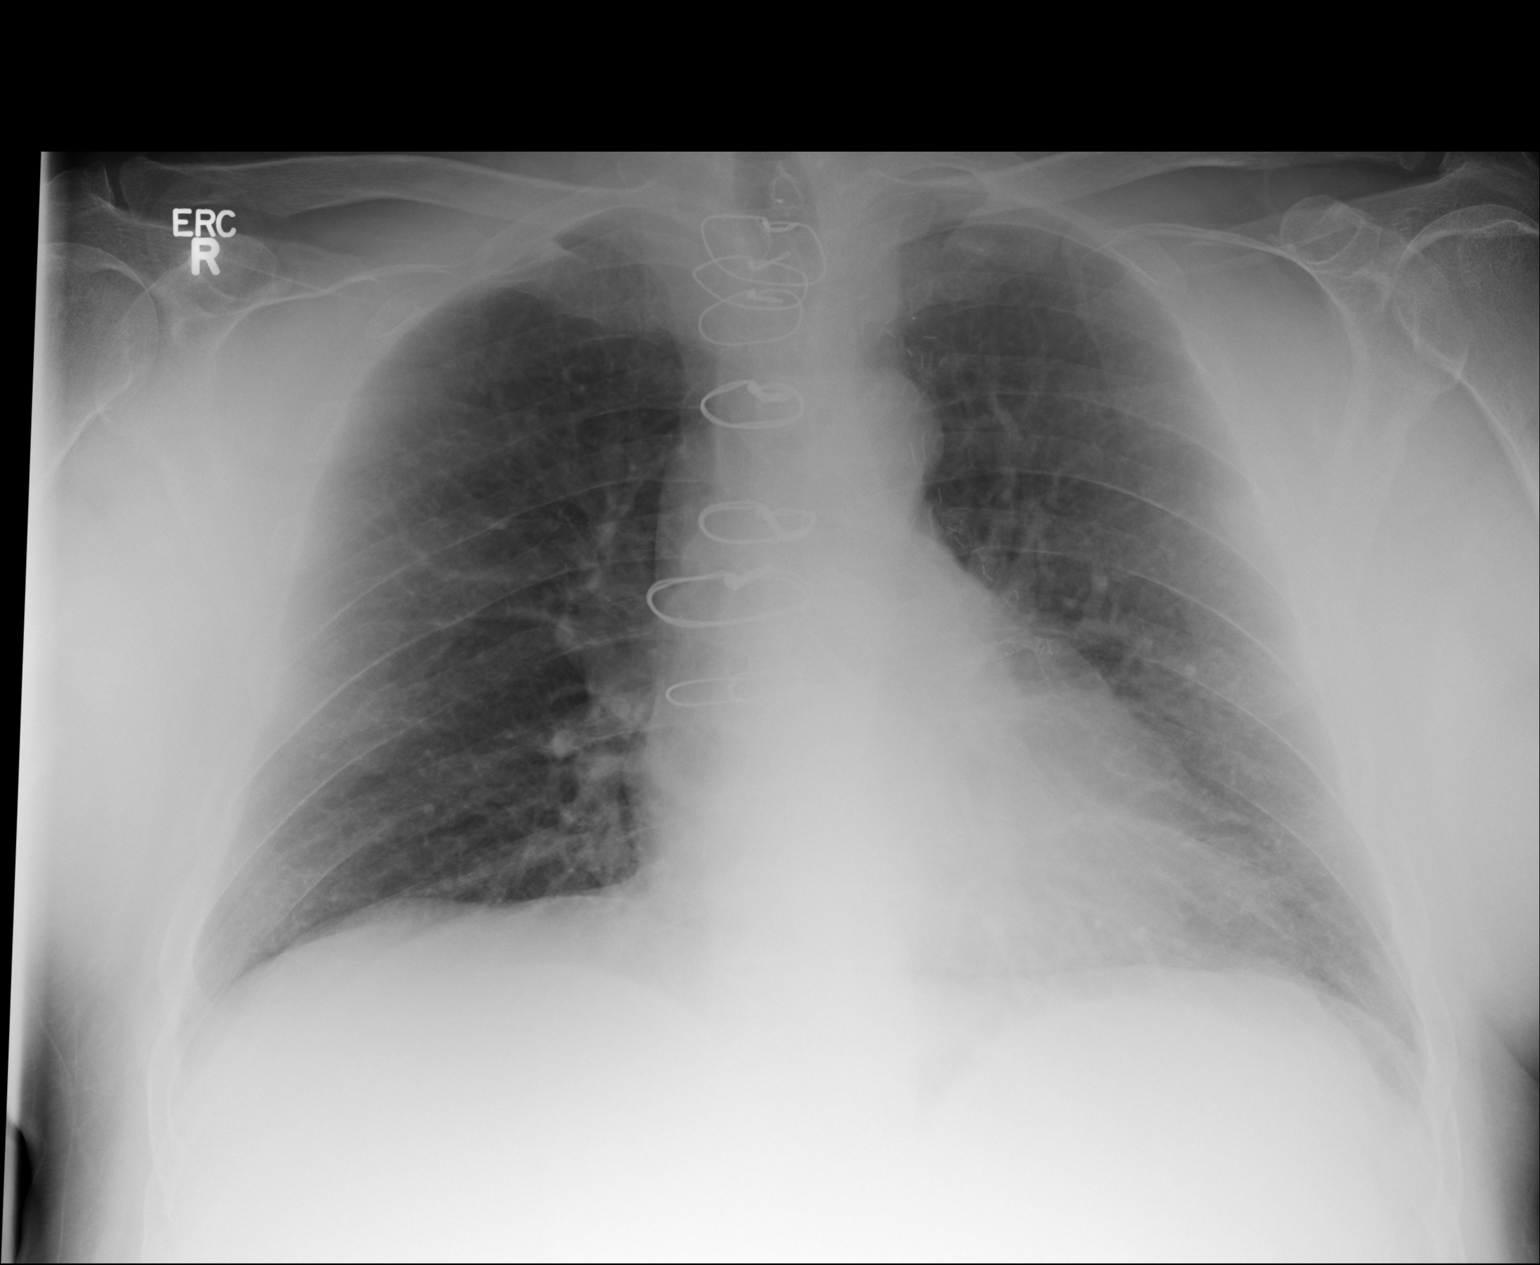

[1 of 1 positions shown; findings below may reference images not displayed]

FINDINGS: There is prior sternotomy. A single AP portable view of the chest
demonstrates no focal airspace consolidation or alveolar edema. The
lungs are grossly clear. There is no large effusion or pneumothorax.
Cardiac and mediastinal contours appear unremarkable.
IMPRESSION: No active disease.

## 2016-12-17 DIAGNOSIS — G4733 Obstructive sleep apnea (adult) (pediatric): Secondary | ICD-10-CM | POA: Diagnosis not present

## 2016-12-17 DIAGNOSIS — J449 Chronic obstructive pulmonary disease, unspecified: Secondary | ICD-10-CM | POA: Diagnosis not present

## 2016-12-18 DIAGNOSIS — I214 Non-ST elevation (NSTEMI) myocardial infarction: Secondary | ICD-10-CM | POA: Diagnosis not present

## 2016-12-18 DIAGNOSIS — I480 Paroxysmal atrial fibrillation: Secondary | ICD-10-CM | POA: Diagnosis not present

## 2016-12-18 DIAGNOSIS — I2510 Atherosclerotic heart disease of native coronary artery without angina pectoris: Secondary | ICD-10-CM | POA: Diagnosis not present

## 2016-12-18 DIAGNOSIS — I10 Essential (primary) hypertension: Secondary | ICD-10-CM | POA: Diagnosis not present

## 2016-12-31 ENCOUNTER — Telehealth: Payer: Self-pay | Admitting: Family Medicine

## 2016-12-31 NOTE — Telephone Encounter (Signed)
Pt contacted office for refill request on the following medications: Last Rx: 12/04/16 Please advise. Thanks TNP

## 2016-12-31 NOTE — Telephone Encounter (Signed)
Please review. KW 

## 2017-01-01 MED ORDER — OXYCODONE-ACETAMINOPHEN 10-325 MG PO TABS: 1.0000 | ORAL_TABLET | ORAL | 0 refills | Status: DC | PRN

## 2017-01-01 NOTE — Telephone Encounter (Signed)
Please review-aa 

## 2017-01-01 NOTE — Telephone Encounter (Signed)
Pt called back regarding pain med. Refill  Please advise.  teri

## 2017-01-07 ENCOUNTER — Other Ambulatory Visit: Payer: Self-pay | Admitting: *Deleted

## 2017-01-07 NOTE — Patient Outreach (Signed)
[facility] [facility]) Care Management  01/07/2017  Lawrence [name] [birth year] [id number]  RN Health Coach  attempted #1 Follow up outreach call to patient.  Per Patient today was not a good time. Patient requested I call back tomorrow.  [place] Care Management [phone]

## 2017-01-08 ENCOUNTER — Ambulatory Visit: Payer: Self-pay | Admitting: *Deleted

## 2017-01-11 ENCOUNTER — Other Ambulatory Visit: Payer: Self-pay | Admitting: Family Medicine

## 2017-01-11 DIAGNOSIS — R0602 Shortness of breath: Secondary | ICD-10-CM | POA: Diagnosis not present

## 2017-01-11 DIAGNOSIS — F419 Anxiety disorder, unspecified: Secondary | ICD-10-CM

## 2017-01-11 DIAGNOSIS — J9611 Chronic respiratory failure with hypoxia: Secondary | ICD-10-CM | POA: Diagnosis not present

## 2017-01-11 DIAGNOSIS — G4733 Obstructive sleep apnea (adult) (pediatric): Secondary | ICD-10-CM | POA: Diagnosis not present

## 2017-01-11 DIAGNOSIS — J449 Chronic obstructive pulmonary disease, unspecified: Secondary | ICD-10-CM | POA: Diagnosis not present

## 2017-01-11 DIAGNOSIS — F17211 Nicotine dependence, cigarettes, in remission: Secondary | ICD-10-CM | POA: Diagnosis not present

## 2017-01-11 NOTE — Telephone Encounter (Signed)
Called into [facility]. Renaldo Fiddler, CMA

## 2017-01-12 DIAGNOSIS — I10 Essential (primary) hypertension: Secondary | ICD-10-CM | POA: Diagnosis not present

## 2017-01-12 DIAGNOSIS — J449 Chronic obstructive pulmonary disease, unspecified: Secondary | ICD-10-CM | POA: Diagnosis not present

## 2017-01-12 DIAGNOSIS — J439 Emphysema, unspecified: Secondary | ICD-10-CM | POA: Diagnosis not present

## 2017-01-12 DIAGNOSIS — G4710 Hypersomnia, unspecified: Secondary | ICD-10-CM | POA: Diagnosis not present

## 2017-01-14 ENCOUNTER — Other Ambulatory Visit: Payer: Self-pay | Admitting: *Deleted

## 2017-01-14 NOTE — Patient Outreach (Signed)
[place] [facility]) Care Management  01/14/2017   Lawrence [name] December 25, 1955 [id number]  RN Health Coach received return telephone call from patient.  Hipaa compliance verified. Per patient he weighed 238 pounds today. Per patient he is weighing every day. Patient is having some swelling in lower extremity. He is only short of breath with ambulating distances. Patient has history of COPD and is on continuous oxygen at  2 liters. Per patient the Dr stated the COPD was doing good and they will follow up next visit with a Pulmonary Function test.  Patient has history  diabetes with an A1C of 7.1. Patient stated it is under control. His blood sugars run 110-120.  Per patient he did not know what zone he was in or what it meant. Patient has agreed to follow up outreach calls.   Current Medications:  Current Outpatient Prescriptions  Medication Sig Dispense Refill  . albuterol (PROVENTIL HFA;VENTOLIN HFA) 108 (90 Base) MCG/ACT inhaler Inhale 4-6 puffs into the lungs every 4 (four) hours as needed for wheezing or shortness of breath. Reported on 04/08/2016    . allopurinol (ZYLOPRIM) 100 MG tablet THREE (3) TABLETS TWICE DAILY 90 tablet 12  . ALPRAZolam (XANAX) 1 MG tablet TAKE ONE-HALF TO ONE TABLET BY MOUTH THREE TIMES DAILY AS NEEDED FOR ANXIETY 90 tablet 3  . amLODipine (NORVASC) 10 MG tablet Take 10 mg by mouth daily.    Marland Kitchen apixaban (ELIQUIS) 5 MG TABS tablet Take 5 mg by mouth 2 (two) times daily.    Marland Kitchen aspirin 81 MG EC tablet Take 81 mg by mouth daily. Swallow whole.    Marland Kitchen atorvastatin (LIPITOR) 80 MG tablet TAKE 1 TABLET BY MOUTH EVERY NIGHT AT BEDTIME. 90 tablet 4  . benzonatate (TESSALON) 200 MG capsule Take 1 capsule (200 mg total) by mouth 3 (three) times daily as needed for cough. 20 capsule 0  . cetirizine (ZYRTEC) 10 MG tablet TAKE ONE TABLET AT BEDTIME 30 tablet 12  . clopidogrel (PLAVIX) 75 MG tablet Take 1 tablet (75 mg total) by mouth daily with breakfast. 30 tablet 1  .  cyclobenzaprine (FLEXERIL) 10 MG tablet TAKE ONE TABLET BY MOUTH EVERY 8 HOURS AS NEEDED FOR MUSCLE SPASM 30 tablet 5  . docusate sodium (COLACE) 100 MG capsule Take 100 mg by mouth 2 (two) times daily.    . ferrous sulfate 325 (65 FE) MG tablet Take 325 mg by mouth daily with breakfast.    . fluticasone furoate-vilanterol (BREO ELLIPTA) 100-25 MCG/INH AEPB Inhale 1 puff into the lungs daily.     Marland Kitchen glucose blood (ACCU-CHEK AVIVA PLUS) test strip Use to check blood sugar once a day 100 each 4  . isosorbide mononitrate (IMDUR) 60 MG 24 hr tablet Take 1 tablet (60 mg total) by mouth daily. 60 tablet 1  . lansoprazole (PREVACID) 30 MG capsule Take 30 mg by mouth 2 (two) times daily.     . magnesium oxide (MAG-OX) 400 (241.3 Mg) MG tablet Take 400 mg by mouth daily.    . metFORMIN (GLUCOPHAGE) 500 MG tablet TAKE 1 TABLET BY MOUTH EVERY MORNING. 90 tablet 4  . metoprolol tartrate (LOPRESSOR) 25 MG tablet Take 0.5 tablets (12.5 mg total) by mouth 2 (two) times daily. (Patient taking differently: Take 25 mg by mouth daily. ) 30 tablet 0  . nitroGLYCERIN (NITROSTAT) 0.4 MG SL tablet Place 1 tablet (0.4 mg total) under the tongue every 5 (five) minutes as needed for chest pain. Reported on 05/26/2016 30  tablet 1  . omega-3 acid ethyl esters (LOVAZA) 1 g capsule TAKE FOUR CAPSULES BY MOUTH DAILY 120 capsule 6  . oxyCODONE-acetaminophen (PERCOCET) 10-325 MG tablet Take 1 tablet by mouth every 4 (four) hours as needed. 150 tablet 0  . RANEXA 500 MG 12 hr tablet TAKE TWO (2) TABLETS TWICE A DAY (Patient not taking: Reported on 11/13/2016) 120 tablet 4  . ranolazine (RANEXA) 1000 MG SR tablet Take 1 tablet (1,000 mg total) by mouth 2 (two) times daily. 60 tablet 1  . sotalol (BETAPACE) 120 MG tablet Take 120 mg by mouth daily.    . sucralfate (CARAFATE) 1 g tablet Take 1 tablet (1 g total) by mouth 2 (two) times daily. 20 tablet 0  . tamsulosin (FLOMAX) 0.4 MG CAPS capsule Take 0.4 mg by mouth daily after  breakfast.     . tiotropium (SPIRIVA) 18 MCG inhalation capsule Place 18 mcg into inhaler and inhale daily.    Marland Kitchen torsemide (DEMADEX) 100 MG tablet Take 100 mg by mouth daily.     No current facility-administered medications for this visit.     Functional Status:  In your present state of health, do you have any difficulty performing the following activities: 01/14/2017 10/29/2016  Hearing? N N  Vision? N N  Difficulty concentrating or making decisions? N N  Walking or climbing stairs? Y Y  Dressing or bathing? N N  Doing errands, shopping? N N  Preparing Food and eating ? N -  Using the Toilet? N -  In the past six months, have you accidently leaked urine? N -  Do you have problems with loss of bowel control? N -  Managing your Medications? N -  Managing your Finances? N -  Housekeeping or managing your Housekeeping? Y -  Some recent data might be hidden    Fall/Depression Screening: PHQ 2/9 Scores 01/14/2017 10/27/2016 04/16/2016 09/06/2015  PHQ - 2 Score 0 0 0 0  PHQ- 9 Score - - - 2   [facility] CM Care Plan Problem One   Flowsheet Row Most Recent Value  Care Plan Problem One  Knowledge deficit in self management of Congestive Heart Failure  Role Documenting the Problem One  [place] for Problem One  Active  [facility]N Long Term Goal (31-90 days)  Patient will not have any readmissions for Congestive Heart Failure within the next 90 days  THN Long Term Goal Start Date  01/14/17  Interventions for Problem One Long Term Goal  RN reminded patient the importance of taking  medications as per physician ordered. Patient reminded to keep appointments with PCP and cardiology. RN will follow monthly telephonic.   [facility] CM Short Term Goal #1 (0-30 days)  Patient will be able to verbalize the 3 signs and symptoms of CHF within the next 30 days  [facility] CM Short Term Goal #1 Start Date  01/14/17  Interventions for Short Term Goal #1  RN sent edicational material on Heat zones ans action plan. RN  will follow up with further discussion and teachback in next outreach  THN CM Short Term Goal #2 (0-30 days)  Patient will be able to verbalize why he weighs daily and what it means within the next 30 days  [facility] CM Short Term Goal #2 Start Date  01/14/17  Interventions for Short Term Goal #2  RN discussed daily weights and what it mean. RN sent educational material on daily weights. RN will follow up with discussion and teach back  Assessment:  Patient does know the Congestive Heart Failure Zones Patient does not know the action plan  Patient will benefit from Health Coach telephonic outreach for education and support for Congestive Heart Failure self management.  Plan:  RN sent educational information on CHF zones Patient will verbalize when to notify doctor due to heart failure symptoms RN send educational material on daily weighing, why and when RN sent 2018 calendar book for documentation of weights RN will follow up within the month of April for discussion and teach back RN will send a barriers letter and assessment letter to [place] Management [phone]

## 2017-01-14 NOTE — Patient Outreach (Signed)
[facility]) Care Management  01/14/2017  Lawrence [name] [birth year] [id number]  RN Health  attempted #2  outreach call to patient.  Patient was unavailable. HIPPA compliance voicemail message was left with return callback number.   Plan: RN will call patient again within 14 days. [place] Care Management [phone]

## 2017-01-15 ENCOUNTER — Encounter: Payer: Self-pay | Admitting: *Deleted

## 2017-01-15 NOTE — Telephone Encounter (Signed)
This encounter was created in error - please disregard.  This encounter was created in error - please disregard.

## 2017-01-16 IMAGING — CR DG CHEST 2V
1 series · 2 of 2 positions shown · non-contrast
Comparison: 02/26/2015

CLINICAL DATA: Midsternal chest pain and epigastric pain for 3
weeks.

EXAM:
CHEST  2 VIEW

[Series 1: dxr chest pa (or ap) and lateral · 0.14mm/px · 2 of 2 slices shown]
[im 1/2]
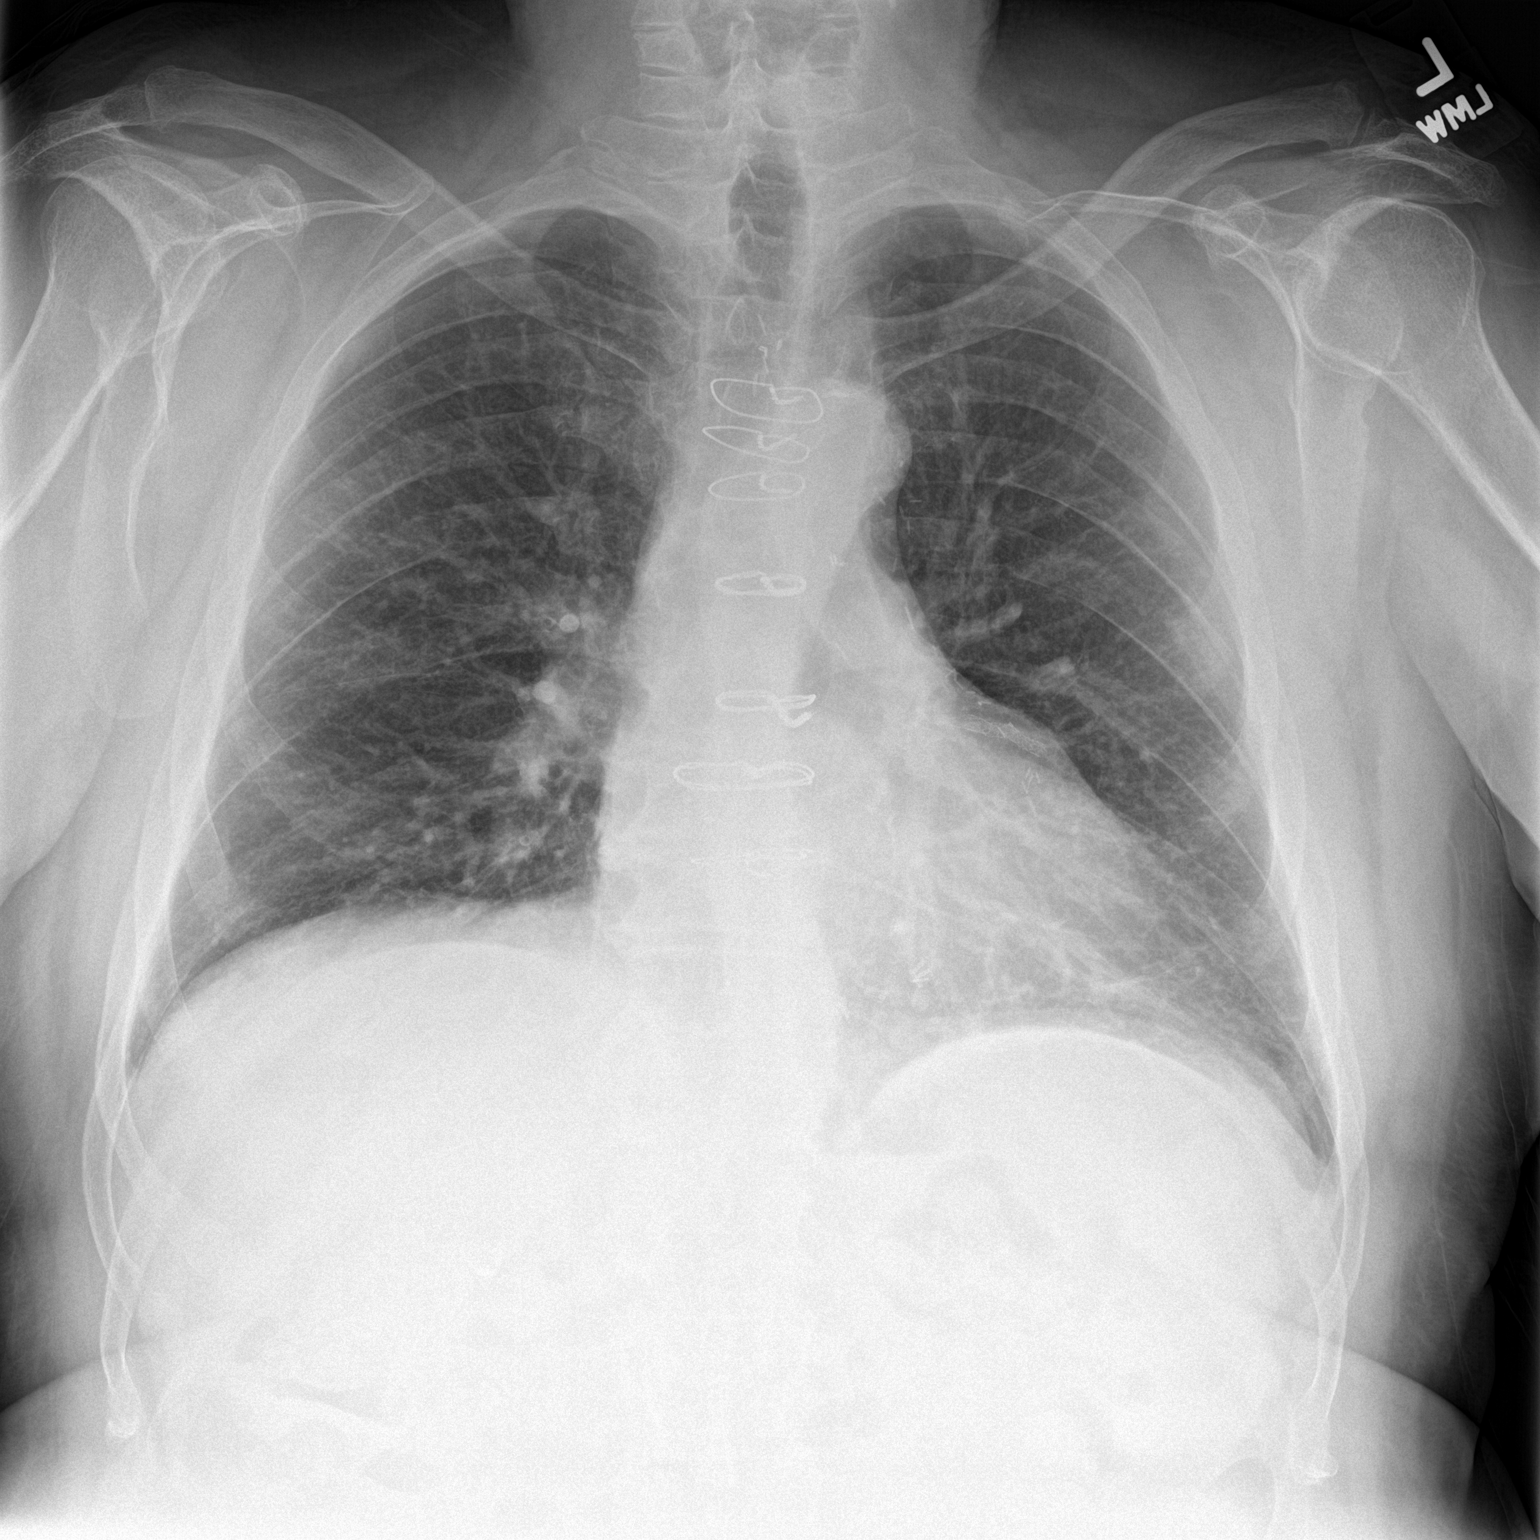
[im 2/2]
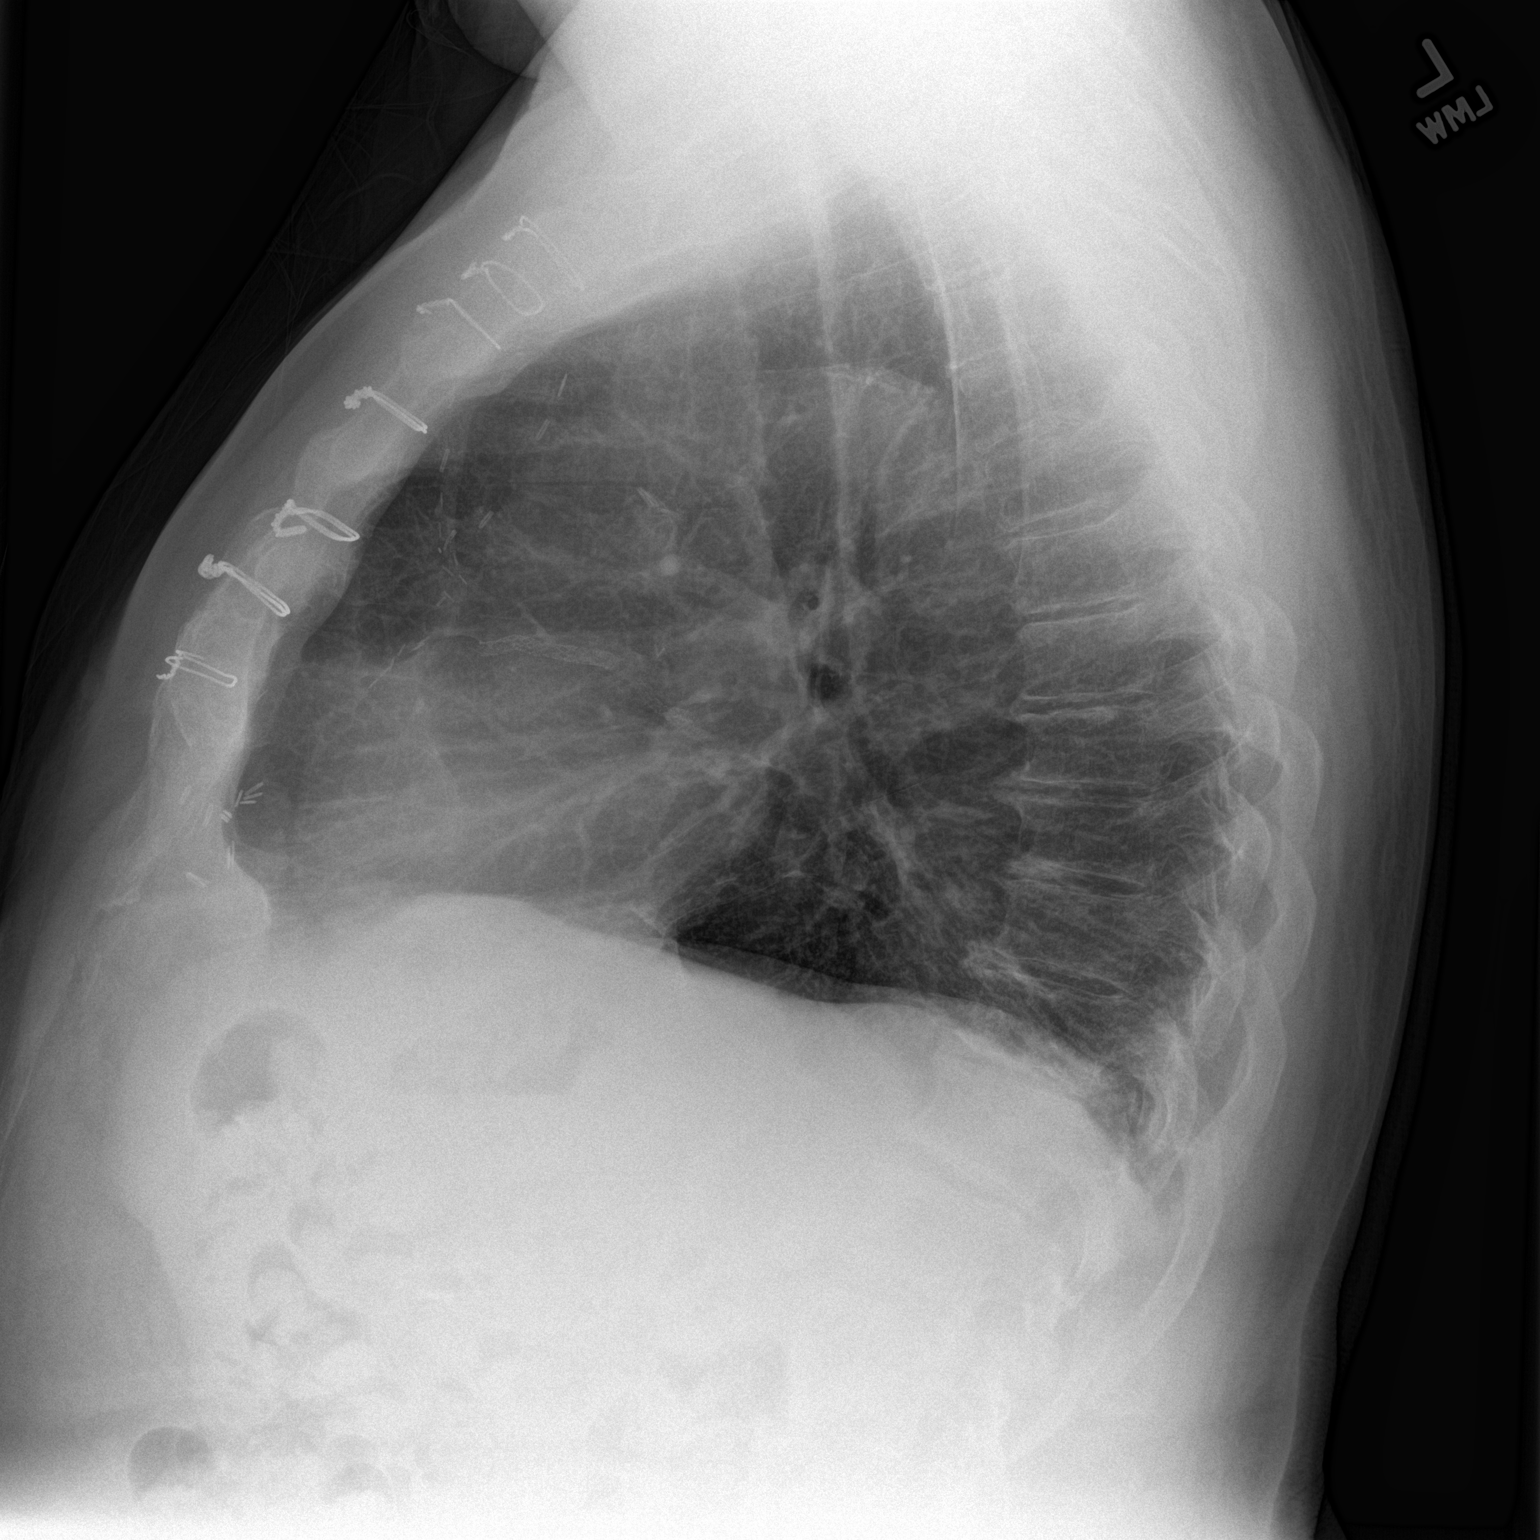

[2 of 2 positions shown; findings below may reference images not displayed]

FINDINGS: Prior median sternotomy is again noted. Cardiomediastinal silhouette
is unchanged. Heart is normal in size. The patient has taken a
slightly shallower inspiration than on the prior study. Mild chronic
coarsening of the interstitial markings is again seen and compatible
with underlying COPD. There is no evidence of confluent airspace
opacity, pulmonary edema, pleural effusion, or pneumothorax. No
acute osseous abnormality is seen.
IMPRESSION: No active cardiopulmonary disease.

## 2017-01-21 ENCOUNTER — Ambulatory Visit: Payer: Self-pay | Admitting: *Deleted

## 2017-01-25 ENCOUNTER — Other Ambulatory Visit: Payer: Self-pay | Admitting: Family Medicine

## 2017-01-28 ENCOUNTER — Telehealth: Payer: Self-pay | Admitting: Family Medicine

## 2017-01-28 MED ORDER — OXYCODONE-ACETAMINOPHEN 10-325 MG PO TABS: 1.0000 | ORAL_TABLET | ORAL | 0 refills | Status: DC | PRN

## 2017-01-28 NOTE — Telephone Encounter (Signed)
Pt contacted office for refill request on the following medications:  oxyCODONE-acetaminophen (PERCOCET) 10-325 MG tablet.  ML#[phone]

## 2017-01-28 NOTE — Telephone Encounter (Signed)
Last RF- 01/01/2017 Last AWE- 10/27/2016

## 2017-02-05 ENCOUNTER — Emergency Department
Admission: EM | Admit: 2017-02-05 | Discharge: 2017-02-05 | Disposition: A | Discharge status: Home / Self Care | Payer: Medicare HMO | Attending: Emergency Medicine | Admitting: Emergency Medicine

## 2017-02-05 ENCOUNTER — Encounter: Payer: Self-pay | Admitting: *Deleted

## 2017-02-05 DIAGNOSIS — S50811A Abrasion of right forearm, initial encounter: Secondary | ICD-10-CM | POA: Insufficient documentation

## 2017-02-05 DIAGNOSIS — E1122 Type 2 diabetes mellitus with diabetic chronic kidney disease: Secondary | ICD-10-CM | POA: Diagnosis not present

## 2017-02-05 DIAGNOSIS — I2510 Atherosclerotic heart disease of native coronary artery without angina pectoris: Secondary | ICD-10-CM | POA: Diagnosis not present

## 2017-02-05 DIAGNOSIS — Y999 Unspecified external cause status: Secondary | ICD-10-CM | POA: Diagnosis not present

## 2017-02-05 DIAGNOSIS — Z79899 Other long term (current) drug therapy: Secondary | ICD-10-CM | POA: Insufficient documentation

## 2017-02-05 DIAGNOSIS — Z951 Presence of aortocoronary bypass graft: Secondary | ICD-10-CM | POA: Insufficient documentation

## 2017-02-05 DIAGNOSIS — Y9389 Activity, other specified: Secondary | ICD-10-CM | POA: Insufficient documentation

## 2017-02-05 DIAGNOSIS — W268XXA Contact with other sharp object(s), not elsewhere classified, initial encounter: Secondary | ICD-10-CM | POA: Insufficient documentation

## 2017-02-05 DIAGNOSIS — T148XXA Other injury of unspecified body region, initial encounter: Secondary | ICD-10-CM

## 2017-02-05 DIAGNOSIS — Z87891 Personal history of nicotine dependence: Secondary | ICD-10-CM | POA: Diagnosis not present

## 2017-02-05 DIAGNOSIS — J45909 Unspecified asthma, uncomplicated: Secondary | ICD-10-CM | POA: Diagnosis not present

## 2017-02-05 DIAGNOSIS — S50812A Abrasion of left forearm, initial encounter: Secondary | ICD-10-CM | POA: Insufficient documentation

## 2017-02-05 DIAGNOSIS — S6992XA Unspecified injury of left wrist, hand and finger(s), initial encounter: Secondary | ICD-10-CM | POA: Diagnosis present

## 2017-02-05 DIAGNOSIS — N189 Chronic kidney disease, unspecified: Secondary | ICD-10-CM | POA: Insufficient documentation

## 2017-02-05 DIAGNOSIS — I5032 Chronic diastolic (congestive) heart failure: Secondary | ICD-10-CM | POA: Diagnosis not present

## 2017-02-05 DIAGNOSIS — Z7982 Long term (current) use of aspirin: Secondary | ICD-10-CM | POA: Diagnosis not present

## 2017-02-05 DIAGNOSIS — J449 Chronic obstructive pulmonary disease, unspecified: Secondary | ICD-10-CM | POA: Diagnosis not present

## 2017-02-05 DIAGNOSIS — Z7984 Long term (current) use of oral hypoglycemic drugs: Secondary | ICD-10-CM | POA: Insufficient documentation

## 2017-02-05 DIAGNOSIS — I130 Hypertensive heart and chronic kidney disease with heart failure and stage 1 through stage 4 chronic kidney disease, or unspecified chronic kidney disease: Secondary | ICD-10-CM | POA: Diagnosis not present

## 2017-02-05 DIAGNOSIS — Y929 Unspecified place or not applicable: Secondary | ICD-10-CM | POA: Insufficient documentation

## 2017-02-05 MED ORDER — OXYCODONE-ACETAMINOPHEN 5-325 MG PO TABS
2.0000 | ORAL_TABLET | Freq: Once | ORAL | Status: AC
  Administered 2017-02-05: 2 via ORAL
  Filled 2017-02-05: qty 2

## 2017-02-05 MED ORDER — TRANEXAMIC ACID 1000 MG/10ML IV SOLN
500.0000 mg | Freq: Once | INTRAVENOUS | Status: DC
  Filled 2017-02-05: qty 10

## 2017-02-05 NOTE — ED Provider Notes (Signed)
[facility] Emergency Department Provider Note  ____________________________________________   None    (approximate)  I have reviewed the triage vital signs and the nursing notes.   HISTORY  Chief Complaint Arm Injury    HPI Lawrence [name] is a 61 y.o. male who comes to the emergency department with multiple wounds to bilateral forearms that he sustained roughly an hour prior to arrival while helping his grandson fix a car. He was on his back leaning up under the car attempting to fix when he noted that his arms are being scraped. When he pulled them out he had bleeding to both arms. His tetanus was last updated 3 years ago. He continued to ooze as he is on Plavix and Eliquis and thatconcerned him.  He did not wash out the wounds at home, but his grandson did dress them.   Past Medical History:  Diagnosis Date  . A-fib ([place])   . Anemia   . Anxiety   . Asthma   . CAD (coronary artery disease)    a. 2002 CABGx2 (LIMA->LAD, VG->VG->OM1);  b. 09/2012 DES->OM;  c. 03/2015 PTCA of LAD [facility]) in setting of atretic LIMA; d. 05/2015 Cath [facility]): nonobs dzs; e. 06/2015 Cath ([facility]): LM nl, LAD 45p/d ISR, 50d, D1/2 small, LCX 50p/d ISR, OM1 70ost, 30 ISR, VG->OM1 50ost, 8m LIMA->LAD 99p/d - atretic, RCA dom, nl; f.cath 10/16: 40-50%(FFR 0.90) pLAD, 75% (FFR 0.77) mLAD s/p PCI/DES, oRCA 40% (FFR0.95)  . Celiac disease   . Chronic diastolic CHF (congestive heart failure) ([place]    a. 06/2009 Echo: EF 60-65%, Gr 1 DD, triv AI, mildly dil LA, nl RV.  .Marland KitchenCOPD (chronic obstructive pulmonary disease) ([place]    a. Chronic bronchitis and emphysema.  .Marland KitchenDysrhythmia   . Essential hypertension   . History of tobacco abuse    a. Quit 2014.  .Marland KitchenPSVT (paroxysmal supraventricular tachycardia) ([place]    a. 10/2012 Noted on Zio Patch.  . Type II diabetes mellitus ([facility]     Patient Active Problem List   Diagnosis Date Noted  . Dyspnea 04/03/2016  . Hypotension 04/03/2016  . Chronic  kidney disease 04/03/2016  . Anemia 04/03/2016  . Paroxysmal atrial fibrillation ([place] 12/23/2015  . OSA (obstructive sleep apnea) 12/10/2015  . Left inguinal hernia 11/07/2015  . Anxiety 11/07/2015  . Unstable angina ([place] 10/05/2015  . PAF (paroxysmal atrial fibrillation) ([place] 10/05/2015  . Back pain with left-sided radiculopathy 09/30/2015  . Nocturnal hypoxia 09/06/2015  . BPH (benign prostatic hyperplasia) 08/01/2015  . Chronic diastolic CHF (congestive heart failure) ([place]   . Angina pectoris ([place]   . Chest pain 07/11/2015  . COPD (chronic obstructive pulmonary disease) ([place] 07/03/2015  . CAD (coronary artery disease) 06/26/2015  . HTN (hypertension) 06/26/2015  . DM (diabetes mellitus) ([place] 06/26/2015  . Achalasia 07/24/2014  . GERD (gastroesophageal reflux disease) 06/07/2014  . Former tobacco use 04/11/2013  . HLD (hyperlipidemia) 04/09/2013    Past Surgical History:  Procedure Laterality Date  . BYPASS GRAFT    . CARDIAC CATHETERIZATION N/A 07/12/2015   rocedure: Left Heart Cath and Cors/Grafts Angiography;  Surgeon: HBelva Crome MD;  [place] [place];  Service: Cardiovascular;  Laterality: N/A;  . CARDIAC CATHETERIZATION Right 10/07/2015   Procedure: Left Heart Cath and Cors/Grafts Angiography;  Surgeon: SDionisio David MD;  [place];  Service: Cardiovascular;  Laterality: Right;  . CARDIAC CATHETERIZATION N/A 04/06/2016   Procedure: Left Heart Cath  and Coronary Angiography;  Surgeon: Yolonda Kida, MD;  Location: [place] [facility];  Service: Cardiovascular;  Laterality: N/A;  . CARDIAC CATHETERIZATION  04/06/2016   Procedure: Bypass Graft Angiography;  Surgeon: Yolonda Kida, MD;  Location: [place] [facility];  Service: Cardiovascular;;  . CARDIAC CATHETERIZATION N/A 11/02/2016   Procedure: Left Heart Cath and Cors/Grafts Angiography and possible PCI;  Surgeon: Yolonda Kida, MD;  Location: [place] [facility];   Service: Cardiovascular;  Laterality: N/A;  . CARDIAC CATHETERIZATION N/A 11/02/2016   Procedure: Coronary Stent Intervention;  Surgeon: Yolonda Kida, MD;  Location: [place] [facility];  Service: Cardiovascular;  Laterality: N/A;  . CHOLECYSTECTOMY    . ESOPHAGEAL DILATION    . TONSILLECTOMY    . VASCULAR SURGERY      Prior to Admission medications   Medication Sig Start Date End Date Taking? Authorizing Provider  albuterol (PROVENTIL HFA;VENTOLIN HFA) 108 (90 Base) MCG/ACT inhaler Inhale 4-6 puffs into the lungs every 4 (four) hours as needed for wheezing or shortness of breath. Reported on 04/08/2016    Historical Provider, MD  allopurinol (ZYLOPRIM) 100 MG tablet THREE (3) TABLETS TWICE DAILY 04/25/16   Birdie Sons, MD  ALPRAZolam Duanne Moron) 1 MG tablet TAKE ONE-HALF TO ONE TABLET BY MOUTH THREE TIMES DAILY AS NEEDED FOR ANXIETY 01/11/17   Birdie Sons, MD  amLODipine (NORVASC) 10 MG tablet Take 10 mg by mouth daily.    Historical Provider, MD  apixaban (ELIQUIS) 5 MG TABS tablet Take 5 mg by mouth 2 (two) times daily.    Historical Provider, MD  aspirin 81 MG EC tablet Take 81 mg by mouth daily. Swallow whole.    Historical Provider, MD  atorvastatin (LIPITOR) 80 MG tablet TAKE 1 TABLET BY MOUTH EVERY NIGHT AT BEDTIME. 05/26/16   Birdie Sons, MD  benzonatate (TESSALON) 200 MG capsule Take 1 capsule (200 mg total) by mouth 3 (three) times daily as needed for cough. 11/30/16   Sable Feil, PA-C  cetirizine (ZYRTEC) 10 MG tablet TAKE ONE TABLET AT BEDTIME 04/19/16   Birdie Sons, MD  clopidogrel (PLAVIX) 75 MG tablet TAKE 1 TABLET BY MOUTH DAILY WITH BREAKFAST 01/25/17   Birdie Sons, MD  cyclobenzaprine (FLEXERIL) 10 MG tablet TAKE ONE TABLET BY MOUTH EVERY 8 HOURS AS NEEDED FOR MUSCLE SPASM 12/01/16   Birdie Sons, MD  docusate sodium (COLACE) 100 MG capsule Take 100 mg by mouth 2 (two) times daily.    Historical Provider, MD  ferrous sulfate 325 (65 FE) MG tablet Take 325  mg by mouth daily with breakfast. 08/03/16 08/03/17  Historical Provider, MD  fluticasone furoate-vilanterol (BREO ELLIPTA) 100-25 MCG/INH AEPB Inhale 1 puff into the lungs daily.     Historical Provider, MD  glucose blood (ACCU-CHEK AVIVA PLUS) test strip Use to check blood sugar once a day 11/26/16   Birdie Sons, MD  isosorbide mononitrate (IMDUR) 60 MG 24 hr tablet Take 1 tablet (60 mg total) by mouth daily. 11/04/16   Henreitta Leber, MD  lansoprazole (PREVACID) 30 MG capsule Take 30 mg by mouth 2 (two) times daily.     Historical Provider, MD  magnesium oxide (MAG-OX) 400 (241.3 Mg) MG tablet Take 400 mg by mouth daily.    Historical Provider, MD  metFORMIN (GLUCOPHAGE) 500 MG tablet TAKE 1 TABLET BY MOUTH EVERY MORNING. 10/05/16   Birdie Sons, MD  metoprolol tartrate (LOPRESSOR) 25 MG tablet Take 0.5  tablets (12.5 mg total) by mouth 2 (two) times daily. Patient taking differently: Take 25 mg by mouth daily.  06/26/16   Epifanio Lesches, MD  nitroGLYCERIN (NITROSTAT) 0.4 MG SL tablet Place 1 tablet (0.4 mg total) under the tongue every 5 (five) minutes as needed for chest pain. Reported on 05/26/2016 10/06/16   Birdie Sons, MD  omega-3 acid ethyl esters (LOVAZA) 1 g capsule TAKE FOUR CAPSULES BY MOUTH DAILY 07/25/16   Birdie Sons, MD  oxyCODONE-acetaminophen (PERCOCET) 10-325 MG tablet Take 1 tablet by mouth every 4 (four) hours as needed. 01/28/17   Birdie Sons, MD  RANEXA 500 MG 12 hr tablet TAKE TWO (2) TABLETS TWICE A DAY Patient not taking: Reported on 11/13/2016 07/25/16   Birdie Sons, MD  ranolazine (RANEXA) 1000 MG SR tablet Take 1 tablet (1,000 mg total) by mouth 2 (two) times daily. 11/03/16   Henreitta Leber, MD  sotalol (BETAPACE) 120 MG tablet Take 120 mg by mouth daily.    Historical Provider, MD  SPIRIVA HANDIHALER 18 MCG inhalation capsule INHALE 1 CAPSULE VIA INHALER ONCE A DAY 01/25/17   Birdie Sons, MD  sucralfate (CARAFATE) 1 g tablet Take 1 tablet (1 g  total) by mouth 2 (two) times daily. 04/22/16   [name], MD  tamsulosin (FLOMAX) 0.4 MG CAPS capsule Take 0.4 mg by mouth daily after breakfast.     Historical Provider, MD  torsemide (DEMADEX) 100 MG tablet Take 100 mg by mouth daily.    Historical Provider, MD    Allergies Demerol [meperidine]; Prednisone; Sulfa antibiotics; Albuterol sulfate [albuterol]; and Morphine sulfate  Family History  Problem Relation Age of Onset  . Heart attack Mother   . Depression Mother   . Heart disease Mother   . COPD Mother   . Hypertension Mother   . Heart attack Father   . Diabetes Father   . Depression Father   . Heart disease Father   . Cirrhosis Father   . Parkinson's disease Brother     Social History Social History  Substance Use Topics  . Smoking status: Former Smoker    Quit date: 04/22/2013  . Smokeless tobacco: Never Used  . Alcohol use No     Comment: remotely quit alcohol use. Hx of heavy alcohol use.    Review of Systems Constitutional: No fever/chills Eyes: No visual changes. ENT: No sore throat. Cardiovascular: Denies chest pain. Respiratory: Denies shortness of breath. Gastrointestinal: No abdominal pain.  No nausea, no vomiting.  No diarrhea.  No constipation. Genitourinary: Negative for dysuria. Musculoskeletal: Negative for back pain. Skin: Positive for wound Neurological: Negative for headaches, focal weakness or numbness.  10-point ROS otherwise negative.  ____________________________________________   PHYSICAL EXAM:  VITAL SIGNS: ED Triage Vitals  Enc Vitals Group     BP 02/05/17 2056 129/73     Pulse Rate 02/05/17 2056 64     Resp 02/05/17 2056 20     Temp 02/05/17 2056 97.5 F (36.4 C)     Temp Source 02/05/17 2056 Oral     SpO2 02/05/17 2056 90 %     Weight 02/05/17 2057 240 lb (108.9 kg)     Height 02/05/17 2057 5' 7"  (1.702 m)     Head Circumference --      Peak Flow --      Pain Score 02/05/17 2057 7     Pain Loc --      Pain Edu?  --  Excl. in GC? --     Constitutional: Alert and oriented x 4 well appearing nontoxic no diaphoresis speaks in full, clear sentences Eyes: PERRL EOMI. Head: Atraumatic. Nose: No congestion/rhinnorhea. Mouth/Throat: No trismus Neck: No stridor.   Respiratory: Normal respiratory effort.  No retractions.  Gastrointestinal: Soft nondistended nontender  Musculoskeletal: No lower extremity edema   Neurologic:  Normal speech and language. No gross focal neurologic deficits are appreciated. Skin:  Multiple small abrasions to the right and left forearm no active bleeding and no lacerations requiring repair Psychiatric: Mood and affect are normal. Speech and behavior are normal.    ____________________________________________   DIFFERENTIAL  Laceration, abrasion, wound infection ____________________________________________   LABS (all labs ordered are listed, but only abnormal results are displayed)  Labs Reviewed - No data to display   __________________________________________  EKG   ____________________________________________  RADIOLOGY   ____________________________________________   PROCEDURES  Procedure(s) performed: no  Procedures  Critical Care performed: no  ____________________________________________   INITIAL IMPRESSION / ASSESSMENT AND PLAN / ED COURSE  Pertinent labs & imaging results that were available during my care of the patient were reviewed by me and considered in my medical decision making (see chart for details).  By the time I saw the patient is bleeding all stopped. His tetanus is up-to-date. He is neurovascularly intact. All of his wounds were washed out copiously with normal saline and then nonocclusive dressings applied. Patient educated on washing out his wounds at home the shower today. No indication for antibiotics at this time. Strict return precautions given. He is discharged home in improved and good condition.       ____________________________________________   FINAL CLINICAL IMPRESSION(S) / ED DIAGNOSES  Final diagnoses:  Abrasion      NEW MEDICATIONS STARTED DURING THIS VISIT:  Discharge Medication List as of 02/05/2017  9:54 PM       Note:  This document was prepared using Dragon voice recognition software and may include unintentional dictation errors.     Darel Hong, MD 02/06/17 1945

## 2017-02-05 NOTE — ED Triage Notes (Signed)
Pt presents w/ c/o bleeding wounds to bilateral hands and RFA. Pt has complicated medical hx and is taking multiple blood thinners.

## 2017-02-05 NOTE — Discharge Instructions (Signed)
Tonight when you get home please get in the shower and use a large amount of soap and water to completely washout the wounds on both her arms. The single most important thing you can do this to clean the wounds and then make sure there dry. Return to the emergency department for any new or worsening symptoms such as fevers, chills, discharge from the arms, or for any other concerns.  It was a pleasure to take care of you today, and thank you for coming to our emergency department.  If you have any questions or concerns before leaving please ask the nurse to grab me and I'm more than happy to go through your aftercare instructions again.  If you were prescribed any opioid pain medication today such as Norco, Vicodin, Percocet, morphine, hydrocodone, or oxycodone please make sure you do not drive when you are taking this medication as it can alter your ability to drive safely.  If you have any concerns once you are home that you are not improving or are in fact getting worse before you can make it to your follow-up appointment, please do not hesitate to call [phone] and come back for further evaluation.  Darel Hong MD

## 2017-02-05 NOTE — ED Notes (Signed)
Patient given verbal wound instructions by EDP and reinforced by this RN. Printed instructions on AVS as well.

## 2017-02-08 ENCOUNTER — Telehealth: Payer: Self-pay

## 2017-02-08 NOTE — Telephone Encounter (Signed)
New patient appointment made for 02/11/2017 at 11:40 am  Thank you for the referral

## 2017-02-09 DIAGNOSIS — G4710 Hypersomnia, unspecified: Secondary | ICD-10-CM | POA: Diagnosis not present

## 2017-02-09 DIAGNOSIS — I10 Essential (primary) hypertension: Secondary | ICD-10-CM | POA: Diagnosis not present

## 2017-02-09 DIAGNOSIS — J439 Emphysema, unspecified: Secondary | ICD-10-CM | POA: Diagnosis not present

## 2017-02-09 DIAGNOSIS — J449 Chronic obstructive pulmonary disease, unspecified: Secondary | ICD-10-CM | POA: Diagnosis not present

## 2017-02-11 ENCOUNTER — Ambulatory Visit: Payer: Medicare HMO | Attending: Family | Admitting: Family

## 2017-02-11 VITALS — BP 104/44 | HR 65 | Resp 18 | Ht 67.0 in | Wt 244.1 lb

## 2017-02-11 DIAGNOSIS — Z888 Allergy status to other drugs, medicaments and biological substances status: Secondary | ICD-10-CM | POA: Insufficient documentation

## 2017-02-11 DIAGNOSIS — G4733 Obstructive sleep apnea (adult) (pediatric): Secondary | ICD-10-CM | POA: Insufficient documentation

## 2017-02-11 DIAGNOSIS — I2510 Atherosclerotic heart disease of native coronary artery without angina pectoris: Secondary | ICD-10-CM | POA: Diagnosis not present

## 2017-02-11 DIAGNOSIS — I110 Hypertensive heart disease with heart failure: Secondary | ICD-10-CM | POA: Diagnosis not present

## 2017-02-11 DIAGNOSIS — J449 Chronic obstructive pulmonary disease, unspecified: Secondary | ICD-10-CM | POA: Diagnosis not present

## 2017-02-11 DIAGNOSIS — I471 Supraventricular tachycardia: Secondary | ICD-10-CM | POA: Insufficient documentation

## 2017-02-11 DIAGNOSIS — Z7901 Long term (current) use of anticoagulants: Secondary | ICD-10-CM | POA: Diagnosis not present

## 2017-02-11 DIAGNOSIS — Z7984 Long term (current) use of oral hypoglycemic drugs: Secondary | ICD-10-CM | POA: Diagnosis not present

## 2017-02-11 DIAGNOSIS — D649 Anemia, unspecified: Secondary | ICD-10-CM | POA: Insufficient documentation

## 2017-02-11 DIAGNOSIS — Z79899 Other long term (current) drug therapy: Secondary | ICD-10-CM | POA: Diagnosis not present

## 2017-02-11 DIAGNOSIS — Z7902 Long term (current) use of antithrombotics/antiplatelets: Secondary | ICD-10-CM | POA: Diagnosis not present

## 2017-02-11 DIAGNOSIS — F419 Anxiety disorder, unspecified: Secondary | ICD-10-CM | POA: Insufficient documentation

## 2017-02-11 DIAGNOSIS — I5031 Acute diastolic (congestive) heart failure: Secondary | ICD-10-CM

## 2017-02-11 DIAGNOSIS — Z885 Allergy status to narcotic agent status: Secondary | ICD-10-CM | POA: Insufficient documentation

## 2017-02-11 DIAGNOSIS — Z951 Presence of aortocoronary bypass graft: Secondary | ICD-10-CM | POA: Insufficient documentation

## 2017-02-11 DIAGNOSIS — I10 Essential (primary) hypertension: Secondary | ICD-10-CM

## 2017-02-11 DIAGNOSIS — Z87891 Personal history of nicotine dependence: Secondary | ICD-10-CM | POA: Insufficient documentation

## 2017-02-11 DIAGNOSIS — E119 Type 2 diabetes mellitus without complications: Secondary | ICD-10-CM | POA: Diagnosis not present

## 2017-02-11 DIAGNOSIS — I5033 Acute on chronic diastolic (congestive) heart failure: Secondary | ICD-10-CM | POA: Diagnosis not present

## 2017-02-11 LAB — BASIC METABOLIC PANEL
Anion gap: 8 (ref 5–15)
BUN: 16 mg/dL (ref 6–20)
CO2: 32 mmol/L (ref 22–32)
Calcium: 9.3 mg/dL (ref 8.9–10.3)
Chloride: 97 mmol/L — ABNORMAL LOW (ref 101–111)
Creatinine, Ser: 1.09 mg/dL (ref 0.61–1.24)
GFR calc Af Amer: >60 mL/min (ref >60)
GFR calc non Af Amer: >60 mL/min (ref >60)
Glucose, Bld: 123 mg/dL — ABNORMAL HIGH (ref 65–99)
Potassium: 3.6 mmol/L (ref 3.5–5.1)
Sodium: 137 mmol/L (ref 135–145)

## 2017-02-11 MED ORDER — METOLAZONE 5 MG PO TABS: 5.0000 mg | ORAL_TABLET | Freq: Every day | ORAL | 0 refills | Status: DC

## 2017-02-11 MED ORDER — POTASSIUM CHLORIDE CRYS ER 20 MEQ PO TBCR: 20.0000 meq | EXTENDED_RELEASE_TABLET | Freq: Every day | ORAL | 5 refills | Status: DC

## 2017-02-11 NOTE — Progress Notes (Signed)
Patient ID: Lawrence [name], male    DOB: 1956-03-31, 61 y.o.   MRN: [id number]  HPI  [name] is a 61 y/o male with a history of DM, PSVT, HTN, COPD (chronic oxygen), CAD with CABG, asthma, anxiety, anemia, atrial fibrillation, obstructive sleep apnea,  past tobacco use and chronic heart failure.   Last echo was done 03/27/16 and showed an EF of 60-65% along with trivial MR/TR. EF has improved from 45-50% November 2016. Most recent cardiac catheterization was done 11/02/16 and a DES was placed in the SVG to OM1 artery.   Was in the ED on 02/05/17 due to abrasions on both arms that wouldn't stop bleeding. Abrasions eventually stopped bleeding and he was discharged home. Was in the ED 11/30/16 due to the flu. Treated and discharged home. Admitted 10/29/16 due to chest pain. Cardiology consult obtained and catheterization done with DES placed. Discharged home after 5 days.   He presents today for his initial visit with fatigue and shortness of breath with minimal exertion. Symptoms do improve upon rest. Also has tightness in the abdomen along with edema in lower legs. Occasional chest pain which does resolve on its own. Has been weighing himself and reports a gradual weight gain. Hasn't been wearing his CPAP as he's currently living in a hotel.    Past Medical History:  Diagnosis Date  . A-fib ([place])   . Anemia   . Anxiety   . Asthma   . CAD (coronary artery disease)    a. 2002 CABGx2 (LIMA->LAD, VG->VG->OM1);  b. 09/2012 DES->OM;  c. 03/2015 PTCA of LAD [facility] in setting of atretic LIMA; d. 05/2015 Cath [facility]): nonobs dzs; e. 06/2015 Cath ([facility]): LM nl, LAD 45p/d ISR, 50d, D1/2 small, LCX 50p/d ISR, OM1 70ost, 30 ISR, VG->OM1 50ost, 63m LIMA->LAD 99p/d - atretic, RCA dom, nl; f.cath 10/16: 40-50%(FFR 0.90) pLAD, 75% (FFR 0.77) mLAD s/p PCI/DES, oRCA 40% (FFR0.95)  . Celiac disease   . Chronic diastolic CHF (congestive heart failure) ([place]    a. 06/2009 Echo: EF 60-65%, Gr 1 DD, triv AI, mildly dil  LA, nl RV.  .Marland KitchenCOPD (chronic obstructive pulmonary disease) ([place]    a. Chronic bronchitis and emphysema.  .Marland KitchenDysrhythmia   . Essential hypertension   . History of tobacco abuse    a. Quit 2014.  .Marland KitchenPSVT (paroxysmal supraventricular tachycardia) ([place]    a. 10/2012 Noted on Zio Patch.  . Type II diabetes mellitus ([place]    Past Surgical History:  Procedure Laterality Date  . BYPASS GRAFT    . CARDIAC CATHETERIZATION N/A 07/12/2015   rocedure: Left Heart Cath and Cors/Grafts Angiography;  Surgeon: HBelva Crome MD;  [place] [place];  Service: Cardiovascular;  Laterality: N/A;  . CARDIAC CATHETERIZATION Right 10/07/2015   Procedure: Left Heart Cath and Cors/Grafts Angiography;  Surgeon: SDionisio David MD;  [place] [facility];  Service: Cardiovascular;  Laterality: Right;  . CARDIAC CATHETERIZATION N/A 04/06/2016   Procedure: Left Heart Cath and Coronary Angiography;  Surgeon: DYolonda Kida MD;  [place] [facility];  Service: Cardiovascular;  Laterality: N/A;  . CARDIAC CATHETERIZATION  04/06/2016   Procedure: Bypass Graft Angiography;  Surgeon: DYolonda Kida MD;  [place];  Service: Cardiovascular;;  . CARDIAC CATHETERIZATION N/A 11/02/2016   Procedure: Left Heart Cath and Cors/Grafts Angiography and possible PCI;  Surgeon: DYolonda Kida MD;  [place];  Service: Cardiovascular;  Laterality:  N/A;  . CARDIAC CATHETERIZATION N/A 11/02/2016   Procedure: Coronary Stent Intervention;  Surgeon: Yolonda Kida, MD;  Location: [place] [facility];  Service: Cardiovascular;  Laterality: N/A;  . CHOLECYSTECTOMY    . ESOPHAGEAL DILATION    . TONSILLECTOMY    . VASCULAR SURGERY     Family History  Problem Relation Age of Onset  . Heart attack Mother   . Depression Mother   . Heart disease Mother   . COPD Mother   . Hypertension Mother   . Heart attack Father   . Diabetes Father   . Depression Father    . Heart disease Father   . Cirrhosis Father   . Parkinson's disease Brother    Social History  Substance Use Topics  . Smoking status: Former Smoker    Quit date: 04/22/2013  . Smokeless tobacco: Never Used  . Alcohol use No     Comment: remotely quit alcohol use. Hx of heavy alcohol use.   Allergies  Allergen Reactions  . Demerol [Meperidine] Hives  . Prednisone Other (See Comments) and Hypertension    Pt states that this medication puts him in A-fib   . Sulfa Antibiotics Hives  . Albuterol Sulfate [Albuterol] Palpitations and Other (See Comments)    Pt currently uses this medication.    . Morphine Sulfate Nausea And Vomiting, Rash and Other (See Comments)    Pt states that he is only allergic to the tablet form of this medication.     Prior to Admission medications   Medication Sig Start Date End Date Taking? Authorizing Provider  allopurinol (ZYLOPRIM) 100 MG tablet THREE (3) TABLETS TWICE DAILY 04/25/16  Yes Birdie Sons, MD  ALPRAZolam Duanne Moron) 1 MG tablet TAKE ONE-HALF TO ONE TABLET BY MOUTH THREE TIMES DAILY AS NEEDED FOR ANXIETY 01/11/17  Yes Birdie Sons, MD  amLODipine (NORVASC) 10 MG tablet Take 10 mg by mouth daily.   Yes Historical Provider, MD  apixaban (ELIQUIS) 5 MG TABS tablet Take 5 mg by mouth 2 (two) times daily.   Yes Historical Provider, MD  atorvastatin (LIPITOR) 80 MG tablet TAKE 1 TABLET BY MOUTH EVERY NIGHT AT BEDTIME. 05/26/16  Yes Birdie Sons, MD  cetirizine (ZYRTEC) 10 MG tablet TAKE ONE TABLET AT BEDTIME 04/19/16  Yes Birdie Sons, MD  clopidogrel (PLAVIX) 75 MG tablet TAKE 1 TABLET BY MOUTH DAILY WITH BREAKFAST 01/25/17  Yes Birdie Sons, MD  cyclobenzaprine (FLEXERIL) 10 MG tablet TAKE ONE TABLET BY MOUTH EVERY 8 HOURS AS NEEDED FOR MUSCLE SPASM 12/01/16  Yes Birdie Sons, MD  docusate sodium (COLACE) 100 MG capsule Take 100 mg by mouth 2 (two) times daily.   Yes Historical Provider, MD  fluticasone furoate-vilanterol (BREO ELLIPTA) 100-25  MCG/INH AEPB Inhale 1 puff into the lungs daily.    Yes Historical Provider, MD  glucose blood (ACCU-CHEK AVIVA PLUS) test strip Use to check blood sugar once a day 11/26/16  Yes Birdie Sons, MD  isosorbide mononitrate (IMDUR) 60 MG 24 hr tablet Take 1 tablet (60 mg total) by mouth daily. 11/04/16  Yes Henreitta Leber, MD  lansoprazole (PREVACID) 30 MG capsule Take 30 mg by mouth 2 (two) times daily.    Yes Historical Provider, MD  magnesium oxide (MAG-OX) 400 (241.3 Mg) MG tablet Take 400 mg by mouth daily.   Yes Historical Provider, MD  metFORMIN (GLUCOPHAGE) 500 MG tablet TAKE 1 TABLET BY MOUTH EVERY MORNING. 10/05/16  Yes [name]  [name], MD  metoprolol tartrate (LOPRESSOR) 25 MG tablet Take 0.5 tablets (12.5 mg total) by mouth 2 (two) times daily. Patient taking differently: Take 25 mg by mouth daily.  06/26/16  Yes Epifanio Lesches, MD  nitroGLYCERIN (NITROSTAT) 0.4 MG SL tablet Place 1 tablet (0.4 mg total) under the tongue every 5 (five) minutes as needed for chest pain. Reported on 05/26/2016 10/06/16  Yes Birdie Sons, MD  omega-3 acid ethyl esters (LOVAZA) 1 g capsule TAKE FOUR CAPSULES BY MOUTH DAILY 07/25/16  Yes Birdie Sons, MD  oxyCODONE-acetaminophen (PERCOCET) 10-325 MG tablet Take 1 tablet by mouth every 4 (four) hours as needed. 01/28/17  Yes Birdie Sons, MD  ranolazine (RANEXA) 1000 MG SR tablet Take 1 tablet (1,000 mg total) by mouth 2 (two) times daily. 11/03/16  Yes Henreitta Leber, MD  sotalol (BETAPACE) 120 MG tablet Take 120 mg by mouth 2 (two) times daily.    Yes Historical Provider, MD  SPIRIVA HANDIHALER 18 MCG inhalation capsule INHALE 1 CAPSULE VIA INHALER ONCE A DAY 01/25/17  Yes Birdie Sons, MD  torsemide (DEMADEX) 100 MG tablet Take 100 mg by mouth daily.   Yes Historical Provider, MD  albuterol (PROVENTIL HFA;VENTOLIN HFA) 108 (90 Base) MCG/ACT inhaler Inhale 4-6 puffs into the lungs every 4 (four) hours as needed for wheezing or shortness of breath.  Reported on 04/08/2016    Historical Provider, MD  benzonatate (TESSALON) 200 MG capsule Take 1 capsule (200 mg total) by mouth 3 (three) times daily as needed for cough. 11/30/16   Sable Feil, PA-C  tamsulosin (FLOMAX) 0.4 MG CAPS capsule Take 0.4 mg by mouth daily after breakfast.     Historical Provider, MD     Review of Systems  Constitutional: Positive for fatigue. Negative for appetite change.  HENT: Negative for congestion, rhinorrhea and sore throat.   Eyes: Negative.   Respiratory: Positive for shortness of breath. Negative for chest tightness.   Cardiovascular: Positive for chest pain and leg swelling.  Gastrointestinal: Positive for abdominal distention and abdominal pain.  Endocrine: Negative.   Genitourinary: Negative.   Musculoskeletal: Positive for back pain (chronic pain). Negative for neck pain.  Skin: Negative.   Allergic/Immunologic: Negative.   Neurological: Positive for light-headedness (with position changes). Negative for dizziness.  Hematological: Negative for adenopathy. Bruises/bleeds easily.  Psychiatric/Behavioral: Positive for sleep disturbance (wearing oxygen at 2L; not wearing CPAP due to space issues). Negative for dysphoric mood and suicidal ideas. The patient is not nervous/anxious.    Vitals:   02/11/17 1203  BP: (!) 104/44  Pulse: 65  Resp: 18  SpO2: 99%  Weight: 244 lb 2 oz (110.7 kg)  Height: 5' 7"  (1.702 m)   Wt Readings from Last 3 Encounters:  02/11/17 244 lb 2 oz (110.7 kg)  02/05/17 240 lb (108.9 kg)  11/30/16 238 lb (108 kg)   Lab Results  Component Value Date   CREATININE 1.09 02/11/2017   CREATININE 1.17 10/30/2016   CREATININE 1.23 10/29/2016    Physical Exam  Constitutional: He is oriented to person, place, and time. He appears well-developed and well-nourished.  HENT:  Head: Normocephalic and atraumatic.  Eyes: Conjunctivae are normal. Pupils are equal, round, and reactive to light.  Neck: Normal range of motion. Neck  supple. No JVD present.  Cardiovascular: Normal rate and regular rhythm.   Pulmonary/Chest: Effort normal. He has no wheezes. He has no rales.  Abdominal: He exhibits distension. There is no tenderness.  Musculoskeletal: He  exhibits edema (1+ pitting edema in bilateral lower legs). He exhibits no tenderness.  Neurological: He is alert and oriented to person, place, and time.  Skin: Skin is warm and dry.  Psychiatric: He has a normal mood and affect. His behavior is normal. Thought content normal.  Nursing note and vitals reviewed.   Assessment & Plan:  1: Acute heart failure with preserved ejection fraction- - NYHA class III - moderately fluid overloaded today - add metolazone 37m daily for 3 days. Best to take it 1/2 hour prior to torsemide if possible - add potassium 219m daily as potassium level 10/30/16 was 3.2 - weighing daily. Instructed to call for an overnight weight gain of >2 pounds or a weekly weight gain of >5 pounds - not adding salt and is currently rinsing canned foods. Reviewed the importance of closely following a 20006modium diet and written dietary information was given to him about this.  - elevate legs as much as possible - stop amlodipine as that could be contributing to pedal edema - unsure of fluid intake and he was instructed to drink between 40-50 ounces of fluid daily. (~2L) - will check a BMP today - saw cardiologist (KoNehemiah Massed/2/18 and returns to him May 2018 - [facility] health referral made   2: HTN- - BP looks good  - saw PCP (FiCaryn Section2/12/17 and returns to him April 2017 - amlodipine being stopped today  3: Obstructive sleep apnea- - wears oxygen at 2L around the clock - has CPAP equipment but it's currently in storage as he's been living in a hotel with family for the last year and doesn't have any space to place the machine - discussed the relationship between untreated sleep apnea and heart failure and strongly encouraged him to find a  place to put the equipment so that he can resume wearing his CPAP  Medication bottles were reviewed.  Return here in 1 week or sooner for any questions/problems before then. Will recheck labs at that time.

## 2017-02-11 NOTE — Patient Instructions (Signed)
Continue weighing daily and call for an overnight weight gain of > 2 pounds or a weekly weight gain of >5 pounds.  Stop amlodipine.  Take metolazone 15m daily for the next 3 days. Best to take it 1/2 hour prior to torsemide if possible.  Prescription sent in for potassium 260m daily every day.

## 2017-02-12 ENCOUNTER — Telehealth: Payer: Self-pay | Admitting: Family

## 2017-02-12 ENCOUNTER — Encounter: Payer: Self-pay | Admitting: Family

## 2017-02-12 DIAGNOSIS — I2510 Atherosclerotic heart disease of native coronary artery without angina pectoris: Secondary | ICD-10-CM | POA: Diagnosis not present

## 2017-02-12 DIAGNOSIS — I5033 Acute on chronic diastolic (congestive) heart failure: Secondary | ICD-10-CM | POA: Diagnosis not present

## 2017-02-12 DIAGNOSIS — E119 Type 2 diabetes mellitus without complications: Secondary | ICD-10-CM | POA: Diagnosis not present

## 2017-02-12 DIAGNOSIS — I110 Hypertensive heart disease with heart failure: Secondary | ICD-10-CM | POA: Diagnosis not present

## 2017-02-12 DIAGNOSIS — I4891 Unspecified atrial fibrillation: Secondary | ICD-10-CM | POA: Diagnosis not present

## 2017-02-12 DIAGNOSIS — J449 Chronic obstructive pulmonary disease, unspecified: Secondary | ICD-10-CM | POA: Diagnosis not present

## 2017-02-12 NOTE — Telephone Encounter (Signed)
Advised patient of lab results that were obtained yesterday (02/11/17). He is to finish out the metolazone and will recheck lab work next week.

## 2017-02-15 ENCOUNTER — Telehealth: Payer: Self-pay | Admitting: *Deleted

## 2017-02-15 NOTE — Telephone Encounter (Signed)
Patient called office concerning his blood glucose readings. Patient stated readings have 198, 207, 238 today also pt has been having pancreatic pain and is concerned that he may have an infection. Per Glory Buff, advised pt to drink only water until pain ceases, then can return to solid foods. Advised pt if pain gets worse he should go to ER.

## 2017-02-16 ENCOUNTER — Other Ambulatory Visit: Payer: Self-pay | Admitting: *Deleted

## 2017-02-16 NOTE — Patient Outreach (Signed)
[name]) Care Management  02/16/2017  Lawrence [name] Jan 02, 1956 [id number]   RN Health Coach telephone call to patient.  Phone line busy with no voice mail pick up. Plan: RN will try call again later today.  [facility] [phone]

## 2017-02-18 DIAGNOSIS — I110 Hypertensive heart disease with heart failure: Secondary | ICD-10-CM | POA: Diagnosis not present

## 2017-02-18 DIAGNOSIS — I2510 Atherosclerotic heart disease of native coronary artery without angina pectoris: Secondary | ICD-10-CM | POA: Diagnosis not present

## 2017-02-18 DIAGNOSIS — J449 Chronic obstructive pulmonary disease, unspecified: Secondary | ICD-10-CM | POA: Diagnosis not present

## 2017-02-18 DIAGNOSIS — I5033 Acute on chronic diastolic (congestive) heart failure: Secondary | ICD-10-CM | POA: Diagnosis not present

## 2017-02-18 DIAGNOSIS — I4891 Unspecified atrial fibrillation: Secondary | ICD-10-CM | POA: Diagnosis not present

## 2017-02-18 DIAGNOSIS — E119 Type 2 diabetes mellitus without complications: Secondary | ICD-10-CM | POA: Diagnosis not present

## 2017-02-19 ENCOUNTER — Telehealth: Payer: Self-pay | Admitting: Family

## 2017-02-19 ENCOUNTER — Ambulatory Visit: Payer: Medicare HMO | Attending: Family | Admitting: Family

## 2017-02-19 ENCOUNTER — Encounter: Payer: Self-pay | Admitting: Family

## 2017-02-19 ENCOUNTER — Ambulatory Visit: Payer: Self-pay | Admitting: *Deleted

## 2017-02-19 VITALS — BP 140/62 | HR 66 | Resp 18 | Ht 67.0 in | Wt 242.1 lb

## 2017-02-19 DIAGNOSIS — Z818 Family history of other mental and behavioral disorders: Secondary | ICD-10-CM | POA: Insufficient documentation

## 2017-02-19 DIAGNOSIS — Z7984 Long term (current) use of oral hypoglycemic drugs: Secondary | ICD-10-CM | POA: Diagnosis not present

## 2017-02-19 DIAGNOSIS — Z9981 Dependence on supplemental oxygen: Secondary | ICD-10-CM | POA: Diagnosis not present

## 2017-02-19 DIAGNOSIS — Z888 Allergy status to other drugs, medicaments and biological substances status: Secondary | ICD-10-CM | POA: Diagnosis not present

## 2017-02-19 DIAGNOSIS — Z8249 Family history of ischemic heart disease and other diseases of the circulatory system: Secondary | ICD-10-CM | POA: Insufficient documentation

## 2017-02-19 DIAGNOSIS — D649 Anemia, unspecified: Secondary | ICD-10-CM | POA: Insufficient documentation

## 2017-02-19 DIAGNOSIS — Z833 Family history of diabetes mellitus: Secondary | ICD-10-CM | POA: Diagnosis not present

## 2017-02-19 DIAGNOSIS — Z885 Allergy status to narcotic agent status: Secondary | ICD-10-CM | POA: Diagnosis not present

## 2017-02-19 DIAGNOSIS — F419 Anxiety disorder, unspecified: Secondary | ICD-10-CM | POA: Insufficient documentation

## 2017-02-19 DIAGNOSIS — I2510 Atherosclerotic heart disease of native coronary artery without angina pectoris: Secondary | ICD-10-CM | POA: Diagnosis not present

## 2017-02-19 DIAGNOSIS — E119 Type 2 diabetes mellitus without complications: Secondary | ICD-10-CM | POA: Diagnosis not present

## 2017-02-19 DIAGNOSIS — I4891 Unspecified atrial fibrillation: Secondary | ICD-10-CM | POA: Insufficient documentation

## 2017-02-19 DIAGNOSIS — J449 Chronic obstructive pulmonary disease, unspecified: Secondary | ICD-10-CM | POA: Diagnosis not present

## 2017-02-19 DIAGNOSIS — I110 Hypertensive heart disease with heart failure: Secondary | ICD-10-CM | POA: Diagnosis not present

## 2017-02-19 DIAGNOSIS — Z87891 Personal history of nicotine dependence: Secondary | ICD-10-CM | POA: Diagnosis not present

## 2017-02-19 DIAGNOSIS — Z951 Presence of aortocoronary bypass graft: Secondary | ICD-10-CM | POA: Diagnosis not present

## 2017-02-19 DIAGNOSIS — I5032 Chronic diastolic (congestive) heart failure: Secondary | ICD-10-CM | POA: Insufficient documentation

## 2017-02-19 DIAGNOSIS — Z882 Allergy status to sulfonamides status: Secondary | ICD-10-CM | POA: Insufficient documentation

## 2017-02-19 DIAGNOSIS — I471 Supraventricular tachycardia: Secondary | ICD-10-CM | POA: Diagnosis not present

## 2017-02-19 DIAGNOSIS — Z79899 Other long term (current) drug therapy: Secondary | ICD-10-CM | POA: Diagnosis not present

## 2017-02-19 DIAGNOSIS — G4733 Obstructive sleep apnea (adult) (pediatric): Secondary | ICD-10-CM | POA: Diagnosis not present

## 2017-02-19 DIAGNOSIS — Z7901 Long term (current) use of anticoagulants: Secondary | ICD-10-CM | POA: Diagnosis not present

## 2017-02-19 DIAGNOSIS — I10 Essential (primary) hypertension: Secondary | ICD-10-CM

## 2017-02-19 DIAGNOSIS — E1159 Type 2 diabetes mellitus with other circulatory complications: Secondary | ICD-10-CM

## 2017-02-19 LAB — BASIC METABOLIC PANEL
Anion gap: 7 (ref 5–15)
BUN: 13 mg/dL (ref 6–20)
CO2: 33 mmol/L — ABNORMAL HIGH (ref 22–32)
Calcium: 9.5 mg/dL (ref 8.9–10.3)
Chloride: 101 mmol/L (ref 101–111)
Creatinine, Ser: 0.99 mg/dL (ref 0.61–1.24)
GFR calc Af Amer: >60 mL/min (ref >60)
GFR calc non Af Amer: >60 mL/min (ref >60)
Glucose, Bld: 140 mg/dL — ABNORMAL HIGH (ref 65–99)
Potassium: 3.5 mmol/L (ref 3.5–5.1)
Sodium: 141 mmol/L (ref 135–145)

## 2017-02-19 MED ORDER — LISINOPRIL 2.5 MG PO TABS: 2.5000 mg | ORAL_TABLET | Freq: Every day | ORAL | 5 refills | Status: DC

## 2017-02-19 MED ORDER — METOLAZONE 5 MG PO TABS: 5.0000 mg | ORAL_TABLET | Freq: Every day | ORAL | 0 refills | Status: DC

## 2017-02-19 NOTE — Telephone Encounter (Signed)
Spoke with patient regarding his lab work. Advised him that his renal function was normal and that his potassium level was normal although at the low end.   While he takes the 3 days of metolazone, he needs to take an additional potassium tablet for those 3 days as well.   Will plan on rechecking his lab work at his next visit if his PCP doesn't check it.

## 2017-02-19 NOTE — Progress Notes (Signed)
Patient ID: Lawrence [name], male    DOB: 1956-04-24, 61 y.o.   MRN: [id number]  HPI Lawrence [name] is a 61 y/o male with a history of DM, PSVT, HTN, COPD (chronic oxygen), CAD with CABG, asthma, anxiety, anemia, atrial fibrillation, obstructive sleep apnea,  past tobacco use and chronic heart failure.   Last echo was done 03/27/16 and showed an EF of 60-65% along with trivial Lawrence/TR. EF has improved from 45-50% November 2016. Most recent cardiac catheterization was done 11/02/16 and a DES was placed in the SVG to OM1 artery.   Was in the ED on 02/05/17 due to abrasions on both arms that wouldn't stop bleeding. Abrasions eventually stopped bleeding and he was discharged home. Was in the ED 11/30/16 due to the flu. Treated and discharged home. Admitted 10/29/16 due to chest pain. Cardiology consult obtained and catheterization done with DES placed. Discharged home after 5 days.   He presents today for follow up visit with fatigue and shortness of breath with minimal exertion. Chest tightness accompanies his SOB. Symptoms improve upon rest. Occasional chest pain which does resolve on its own. Says he started weighing himself last weekend. Claims to have swelling in lower extremities. Hasn't been wearing his CPAP as he's currently living in a hotel with 7 other people and says there is no room for it, he is actively looking for an apartment  Past Medical History:  Diagnosis Date  . A-fib ([place])   . Anemia   . Anxiety   . Asthma   . CAD (coronary artery disease)    a. 2002 CABGx2 (LIMA->LAD, VG->VG->OM1);  b. 09/2012 DES->OM;  c. 03/2015 PTCA of LAD [facility]) in setting of atretic LIMA; d. 05/2015 Cath [facility]): nonobs dzs; e. 06/2015 Cath ([facility]): LM nl, LAD 45p/d ISR, 50d, D1/2 small, LCX 50p/d ISR, OM1 70ost, 30 ISR, VG->OM1 50ost, 57m LIMA->LAD 99p/d - atretic, RCA dom, nl; f.cath 10/16: 40-50%(FFR 0.90) pLAD, 75% (FFR 0.77) mLAD s/p PCI/DES, oRCA 40% (FFR0.95)  . Celiac disease   . Chronic diastolic CHF  (congestive heart failure) ([place]    a. 06/2009 Echo: EF 60-65%, Gr 1 DD, triv AI, mildly dil LA, nl RV.  .Marland KitchenCOPD (chronic obstructive pulmonary disease) ([place]    a. Chronic bronchitis and emphysema.  .Marland KitchenDysrhythmia   . Essential hypertension   . History of tobacco abuse    a. Quit 2014.  .Marland KitchenPSVT (paroxysmal supraventricular tachycardia) ([place]    a. 10/2012 Noted on Zio Patch.  . Type II diabetes mellitus ([place]    Past Surgical History:  Procedure Laterality Date  . BYPASS GRAFT    . CARDIAC CATHETERIZATION N/A 07/12/2015   rocedure: Left Heart Cath and Cors/Grafts Angiography;  Surgeon: HBelva Crome MD;  [place] [place];  Service: Cardiovascular;  Laterality: N/A;  . CARDIAC CATHETERIZATION Right 10/07/2015   Procedure: Left Heart Cath and Cors/Grafts Angiography;  Surgeon: SDionisio David MD;  [facility];  Service: Cardiovascular;  Laterality: Right;  . CARDIAC CATHETERIZATION N/A 04/06/2016   Procedure: Left Heart Cath and Coronary Angiography;  Surgeon: DYolonda Kida MD;  [place] [facility];  Service: Cardiovascular;  Laterality: N/A;  . CARDIAC CATHETERIZATION  04/06/2016   Procedure: Bypass Graft Angiography;  Surgeon: DYolonda Kida MD;  [place] [facility];  Service: Cardiovascular;;  . CARDIAC CATHETERIZATION N/A 11/02/2016   Procedure: Left Heart Cath and Cors/Grafts Angiography and possible PCI;  Surgeon: DYolonda Kida  MD;  Location: [place] [facility];  Service: Cardiovascular;  Laterality: N/A;  . CARDIAC CATHETERIZATION N/A 11/02/2016   Procedure: Coronary Stent Intervention;  Surgeon: Yolonda Kida, MD;  Location: [place] [facility];  Service: Cardiovascular;  Laterality: N/A;  . CHOLECYSTECTOMY    . ESOPHAGEAL DILATION    . TONSILLECTOMY    . VASCULAR SURGERY     Family History  Problem Relation Age of Onset  . Heart attack Mother   . Depression Mother   . Heart disease Mother   . COPD Mother    . Hypertension Mother   . Heart attack Father   . Diabetes Father   . Depression Father   . Heart disease Father   . Cirrhosis Father   . Parkinson's disease Brother    Social History  Substance Use Topics  . Smoking status: Former Smoker    Quit date: 04/22/2013  . Smokeless tobacco: Never Used  . Alcohol use No     Comment: remotely quit alcohol use. Hx of heavy alcohol use.   Allergies  Allergen Reactions  . Demerol [Meperidine] Hives  . Prednisone Other (See Comments) and Hypertension    Pt states that this medication puts him in A-fib   . Sulfa Antibiotics Hives  . Albuterol Sulfate [Albuterol] Palpitations and Other (See Comments)    Pt currently uses this medication.    . Morphine Sulfate Nausea And Vomiting, Rash and Other (See Comments)    Pt states that he is only allergic to the tablet form of this medication.     Prior to Admission medications   Medication Sig Start Date End Date Taking? Authorizing Provider  albuterol (PROVENTIL HFA;VENTOLIN HFA) 108 (90 Base) MCG/ACT inhaler Inhale 4-6 puffs into the lungs every 4 (four) hours as needed for wheezing or shortness of breath. Reported on 04/08/2016   Yes Historical Provider, MD  allopurinol (ZYLOPRIM) 100 MG tablet THREE (3) TABLETS TWICE DAILY Patient taking differently: Three tablets once daily 04/25/16  Yes Birdie Sons, MD  ALPRAZolam Duanne Moron) 1 MG tablet TAKE ONE-HALF TO ONE TABLET BY MOUTH THREE TIMES DAILY AS NEEDED FOR ANXIETY 01/11/17  Yes Birdie Sons, MD  apixaban (ELIQUIS) 5 MG TABS tablet Take 5 mg by mouth 2 (two) times daily.   Yes Historical Provider, MD  atorvastatin (LIPITOR) 80 MG tablet TAKE 1 TABLET BY MOUTH EVERY NIGHT AT BEDTIME. 05/26/16  Yes Birdie Sons, MD  cetirizine (ZYRTEC) 10 MG tablet TAKE ONE TABLET AT BEDTIME 04/19/16  Yes Birdie Sons, MD  clopidogrel (PLAVIX) 75 MG tablet TAKE 1 TABLET BY MOUTH DAILY WITH BREAKFAST 01/25/17  Yes Birdie Sons, MD  cyclobenzaprine (FLEXERIL) 10  MG tablet TAKE ONE TABLET BY MOUTH EVERY 8 HOURS AS NEEDED FOR MUSCLE SPASM 12/01/16  Yes Birdie Sons, MD  docusate sodium (COLACE) 100 MG capsule Take 100 mg by mouth 2 (two) times daily.   Yes Historical Provider, MD  fluticasone furoate-vilanterol (BREO ELLIPTA) 100-25 MCG/INH AEPB Inhale 1 puff into the lungs daily.    Yes Historical Provider, MD  glucose blood (ACCU-CHEK AVIVA PLUS) test strip Use to check blood sugar once a day 11/26/16  Yes Birdie Sons, MD  isosorbide mononitrate (IMDUR) 60 MG 24 hr tablet Take 1 tablet (60 mg total) by mouth daily. 11/04/16  Yes Henreitta Leber, MD  lansoprazole (PREVACID) 30 MG capsule Take 30 mg by mouth 2 (two) times daily.    Yes Historical Provider, MD  magnesium oxide (MAG-OX) 400 (241.3 Mg) MG tablet Take 400 mg by mouth daily.   Yes Historical Provider, MD  metFORMIN (GLUCOPHAGE) 500 MG tablet TAKE 1 TABLET BY MOUTH EVERY MORNING. 10/05/16  Yes Birdie Sons, MD  metoprolol tartrate (LOPRESSOR) 25 MG tablet Take 0.5 tablets (12.5 mg total) by mouth 2 (two) times daily. 06/26/16  Yes Epifanio Lesches, MD  nitroGLYCERIN (NITROSTAT) 0.4 MG SL tablet Place 1 tablet (0.4 mg total) under the tongue every 5 (five) minutes as needed for chest pain. Reported on 05/26/2016 10/06/16  Yes Birdie Sons, MD  omega-3 acid ethyl esters (LOVAZA) 1 g capsule TAKE FOUR CAPSULES BY MOUTH DAILY 07/25/16  Yes Birdie Sons, MD  oxyCODONE-acetaminophen (PERCOCET) 10-325 MG tablet Take 1 tablet by mouth every 4 (four) hours as needed. 01/28/17  Yes Birdie Sons, MD  potassium chloride SA (K-DUR,KLOR-CON) 20 MEQ tablet Take 1 tablet (20 mEq total) by mouth daily. 02/11/17 05/12/17 Yes Alisa Graff, FNP  ranolazine (RANEXA) 1000 MG SR tablet Take 1 tablet (1,000 mg total) by mouth 2 (two) times daily. 11/03/16  Yes Henreitta Leber, MD  SPIRIVA HANDIHALER 18 MCG inhalation capsule INHALE 1 CAPSULE VIA INHALER ONCE A DAY 01/25/17  Yes Birdie Sons, MD  tamsulosin  (FLOMAX) 0.4 MG CAPS capsule Take 0.4 mg by mouth daily after breakfast.    Yes Historical Provider, MD  torsemide (DEMADEX) 100 MG tablet Take 100 mg by mouth daily.   Yes Historical Provider, MD  benzonatate (TESSALON) 200 MG capsule Take 1 capsule (200 mg total) by mouth 3 (three) times daily as needed for cough. Patient not taking: Reported on 02/19/2017 11/30/16   Sable Feil, PA-C  lisinopril (PRINIVIL,ZESTRIL) 2.5 MG tablet Take 1 tablet (2.5 mg total) by mouth daily. 02/19/17   Alisa Graff, FNP  metolazone (ZAROXOLYN) 5 MG tablet Take 1 tablet (5 mg total) by mouth daily. 02/19/17 05/20/17  Alisa Graff, FNP  sotalol (BETAPACE) 120 MG tablet Take 120 mg by mouth 2 (two) times daily.     Historical Provider, MD     Review of Systems  Constitutional: Positive for fatigue. Negative for appetite change.  HENT: Negative for congestion and postnasal drip.   Eyes: Negative.   Respiratory: Positive for shortness of breath. Negative for cough and chest tightness.   Cardiovascular: Positive for chest pain, palpitations and leg swelling.       CP when walking  Gastrointestinal: Positive for abdominal distention and nausea. Negative for abdominal pain, diarrhea and vomiting.  Endocrine: Negative.   Genitourinary: Negative.   Musculoskeletal: Positive for back pain. Negative for neck pain.       Chronic back pain  Skin: Negative.   Allergic/Immunologic: Negative.   Neurological: Negative for dizziness, light-headedness and headaches.  Hematological: Negative for adenopathy. Bruises/bleeds easily.       On Eliquis and Plavix  Psychiatric/Behavioral: Negative for dysphoric mood, sleep disturbance and suicidal ideas. The patient is not nervous/anxious.        2 pillows    Physical Exam  Constitutional: He is oriented to person, place, and time. He appears well-developed and well-nourished.  HENT:  Head: Normocephalic and atraumatic.  Eyes: Conjunctivae are normal. Pupils are equal, round,  and reactive to light.  Neck: Normal range of motion. Neck supple. No JVD present.  Cardiovascular: Normal rate and regular rhythm.   Pulmonary/Chest: Effort normal. He has no wheezes. He has no rales.  Abdominal: Soft. He exhibits distension.  There is no tenderness.  Musculoskeletal: He exhibits edema (1+ pitting edema in bilateral lower legs). He exhibits no tenderness.  Neurological: He is alert and oriented to person, place, and time.  Skin: Skin is warm and dry.  Psychiatric: He has a normal mood and affect. His behavior is normal. Thought content normal.  Nursing note and vitals reviewed.   Vitals:   02/19/17 0926  BP: 140/62  Pulse: 66  Resp: 18  SpO2: 100%  Weight: 242 lb 2 oz (109.8 kg)  Height: 5' 7"  (1.702 m)   Wt Readings from Last 3 Encounters:  02/19/17 242 lb 2 oz (109.8 kg)  02/11/17 244 lb 2 oz (110.7 kg)  02/05/17 240 lb (108.9 kg)   Lab Results  Component Value Date   CREATININE 1.09 02/11/2017   CREATININE 1.17 10/30/2016   CREATININE 1.23 10/29/2016    Assessment & Plan:  1: Acute heart failure with preserved ejection fraction- - NYHA class III - mildly fluid overloaded today - added metolazone 39m daily for 3 days and 20 mEq at last visit - weighing daily started < 1 week ago. Instructed to start writing down daily weights. Per pt, weight is up ~4 lbs overnight. Instructed to call for an overnight weight gain of >2 pounds or a weekly weight gain of >5 pounds - not adding salt and is currently rinsing canned foods. Reviewed the importance of closely following a 2000 mg sodium diet. Living in hotel so eating a lot of frozen meals, wife is looking at all labels - elevate legs as much as possible - unsure of fluid intake and he was instructed to drink between 40-50 ounces of fluid daily (~2L) - saw cardiologist ([name] 12/18/16 and returns to him May 2018 [facility] - Adding metolazone 5 mg daily x 3 days - will check a BMP  today  2: HTN- - BP little elevated at 140/62 mmHg - post MI: was told to stop Plavix in June 2018 and potentially restart ASA 81 - checked [facility] at [facility] this week and it was 191 SBP; nurse checked yesterday and nurse said SBP was elevated but he is unsure of number - amlodipine stopped at list visit as it may have been contributing to pedal edema - Will begin lisinopril 2.5 mg daily  - saw PCP ([name] 10/27/16 and returns to him next week  3: Obstructive sleep apnea- - wears oxygen at 2L around the clock - has CPAP equipment but it's currently in storage as he's been living in a hotel with family for the last year and doesn't have any space to place the machine - reinforced relationship between untreated sleep apnea and heart failure but living situation is restricting   4. Type 2 Diabetes - A1c 7.1 (avg 157) on 10/27/16  - Checking 1-2 x a day - no low blood sugars  - May 02, 2017 is 4 year tobacco quit date  Patient did not bring medications with him but nothing has changed since last week. Encouraged him to bring bottles to every visit.  Return here in 2 weeks or sooner for any questions/problems before then. Will recheck labs at that time  HDarrow Bussing PharmD Pharmacy Resident 02/19/2017 10:49 AM

## 2017-02-19 NOTE — Patient Instructions (Signed)
Continue weighing daily and call for an overnight weight gain of > 2 pounds or a weekly weight gain of >5 pounds.  Begin lisinopril 2.16m daily.  Will add metolazone 2.546mdaily for the next 3 days.   Will call you after lab results are received as you may need more potassium.

## 2017-02-26 ENCOUNTER — Ambulatory Visit (INDEPENDENT_AMBULATORY_CARE_PROVIDER_SITE_OTHER): Payer: Medicare HMO | Admitting: Family Medicine

## 2017-02-26 ENCOUNTER — Encounter: Payer: Self-pay | Admitting: Family Medicine

## 2017-02-26 VITALS — BP 126/70 | HR 64 | Temp 98.5°F | Resp 20 | Wt 230.0 lb

## 2017-02-26 DIAGNOSIS — E1159 Type 2 diabetes mellitus with other circulatory complications: Secondary | ICD-10-CM

## 2017-02-26 DIAGNOSIS — E785 Hyperlipidemia, unspecified: Secondary | ICD-10-CM

## 2017-02-26 LAB — POCT GLYCOSYLATED HEMOGLOBIN (HGB A1C)
Est. average glucose Bld gHb Est-mCnc: 166
Hemoglobin A1C: 7.4

## 2017-02-26 MED ORDER — OXYCODONE-ACETAMINOPHEN 10-325 MG PO TABS: 1.0000 | ORAL_TABLET | ORAL | 0 refills | Status: DC | PRN

## 2017-02-26 MED ORDER — EMPAGLIFLOZIN 10 MG PO TABS
10.0000 mg | ORAL_TABLET | Freq: Every day | ORAL | 0 refills | Status: DC
Start: 2017-02-26 — End: 2017-03-05

## 2017-02-26 NOTE — Progress Notes (Signed)
Patient: Lawrence [name] Male    DOB: [birth year]   61 y.o.   MRN: [id number] Visit Date: 02/26/2017  Today's Provider: [name], MD   Chief Complaint  Patient presents with  . Diabetes  . Hyperlipidemia  . Hypokalemia   Subjective:    HPI  Diabetes Mellitus Type II, Follow-up:   Lab Results  Component Value Date   HGBA1C 7.4 02/26/2017   HGBA1C 7.1 10/27/2016   HGBA1C 6.6 06/19/2016    Last seen for diabetes 4 months ago.  Management since then includes no changes. He reports good compliance with treatment. He is not having side effects.  Current symptoms include none and have been stable. Home blood sugar records: fasting range: 190s  Episodes of hypoglycemia? no   Current Insulin Regimen: none Most Recent Eye Exam: due  Weight trend: stable Prior visit with dietician: no Current diet: well balanced Current exercise: none  Pertinent Labs:    Component Value Date/Time   CHOL 157 10/31/2016 0541   CHOL 146 10/27/2016 1435   CHOL 92 05/23/2014 0415   TRIG 424 (H) 10/31/2016 0541   TRIG 109 05/23/2014 0415   HDL 30 (L) 10/31/2016 0541   HDL 39 (L) 10/27/2016 1435   HDL 28 (L) 05/23/2014 0415   LDLCALC UNABLE TO CALCULATE IF TRIGLYCERIDE OVER 400 mg/dL 10/31/2016 0541   LDLCALC 64 10/27/2016 1435   LDLCALC 42 05/23/2014 0415   CREATININE 0.99 02/19/2017 1023   CREATININE 0.84 03/07/2015 1851    Wt Readings from Last 3 Encounters:  02/26/17 230 lb (104.3 kg)  02/19/17 242 lb 2 oz (109.8 kg)  02/11/17 244 lb 2 oz (110.7 kg)     Lipid/Cholesterol, Follow-up:   Last seen for this4 months ago.  Management changes since that visit include no changes. . Last Lipid Panel:    Component Value Date/Time   CHOL 157 10/31/2016 0541   CHOL 146 10/27/2016 1435   CHOL 92 05/23/2014 0415   TRIG 424 (H) 10/31/2016 0541   TRIG 109 05/23/2014 0415   HDL 30 (L) 10/31/2016 0541   HDL 39 (L) 10/27/2016 1435   HDL 28 (L) 05/23/2014 0415   CHOLHDL 5.2  10/31/2016 0541   VLDL UNABLE TO CALCULATE IF TRIGLYCERIDE OVER 400 mg/dL 10/31/2016 0541   VLDL 22 05/23/2014 0415   LDLCALC UNABLE TO CALCULATE IF TRIGLYCERIDE OVER 400 mg/dL 10/31/2016 0541   LDLCALC 64 10/27/2016 1435   LDLCALC 42 05/23/2014 0415    Risk factors for vascular disease include diabetes mellitus  He reports good compliance with treatment. He is not having side effects.  Current symptoms include none and have been stable. Weight trend: stable Prior visit with dietician: no Current diet: well balanced Current exercise: walking  Wt Readings from Last 3 Encounters:  02/26/17 230 lb (104.3 kg)  02/19/17 242 lb 2 oz (109.8 kg)  02/11/17 244 lb 2 oz (110.7 kg)     Hypokalemia, follow up: Patient reports that his potassium dose was increased to 66mq daily at heart failure clinic, has follow up on 4/19 and follow up Dr. MLyndel Pleasureon 03/25/2017.     Allergies  Allergen Reactions  . Demerol [Meperidine] Hives  . Prednisone Other (See Comments) and Hypertension    Pt states that this medication puts him in A-fib   . Sulfa Antibiotics Hives  . Albuterol Sulfate [Albuterol] Palpitations and Other (See Comments)    Pt currently uses this medication.    . Morphine  Sulfate Nausea And Vomiting, Rash and Other (See Comments)    Pt states that he is only allergic to the tablet form of this medication.       Current Outpatient Prescriptions:  .  albuterol (PROVENTIL HFA;VENTOLIN HFA) 108 (90 Base) MCG/ACT inhaler, Inhale 4-6 puffs into the lungs every 4 (four) hours as needed for wheezing or shortness of breath. Reported on 04/08/2016, Disp: , Rfl:  .  allopurinol (ZYLOPRIM) 100 MG tablet, THREE (3) TABLETS TWICE DAILY (Patient taking differently: Three tablets once daily), Disp: 90 tablet, Rfl: 12 .  ALPRAZolam (XANAX) 1 MG tablet, TAKE ONE-HALF TO ONE TABLET BY MOUTH THREE TIMES DAILY AS NEEDED FOR ANXIETY, Disp: 90 tablet, Rfl: 3 .  apixaban (ELIQUIS) 5 MG TABS tablet, Take  5 mg by mouth 2 (two) times daily., Disp: , Rfl:  .  atorvastatin (LIPITOR) 80 MG tablet, TAKE 1 TABLET BY MOUTH EVERY NIGHT AT BEDTIME., Disp: 90 tablet, Rfl: 4 .  cetirizine (ZYRTEC) 10 MG tablet, TAKE ONE TABLET AT BEDTIME, Disp: 30 tablet, Rfl: 12 .  clopidogrel (PLAVIX) 75 MG tablet, TAKE 1 TABLET BY MOUTH DAILY WITH BREAKFAST, Disp: 30 tablet, Rfl: 12 .  cyclobenzaprine (FLEXERIL) 10 MG tablet, TAKE ONE TABLET BY MOUTH EVERY 8 HOURS AS NEEDED FOR MUSCLE SPASM, Disp: 30 tablet, Rfl: 5 .  docusate sodium (COLACE) 100 MG capsule, Take 100 mg by mouth 2 (two) times daily., Disp: , Rfl:  .  fluticasone furoate-vilanterol (BREO ELLIPTA) 100-25 MCG/INH AEPB, Inhale 1 puff into the lungs daily. , Disp: , Rfl:  .  glucose blood (ACCU-CHEK AVIVA PLUS) test strip, Use to check blood sugar once a day, Disp: 100 each, Rfl: 4 .  isosorbide mononitrate (IMDUR) 60 MG 24 hr tablet, Take 1 tablet (60 mg total) by mouth daily., Disp: 60 tablet, Rfl: 1 .  lansoprazole (PREVACID) 30 MG capsule, Take 30 mg by mouth 2 (two) times daily. , Disp: , Rfl:  .  lisinopril (PRINIVIL,ZESTRIL) 2.5 MG tablet, Take 1 tablet (2.5 mg total) by mouth daily., Disp: 30 tablet, Rfl: 5 .  magnesium oxide (MAG-OX) 400 (241.3 Mg) MG tablet, Take 400 mg by mouth daily., Disp: , Rfl:  .  metFORMIN (GLUCOPHAGE) 500 MG tablet, TAKE 1 TABLET BY MOUTH EVERY MORNING., Disp: 90 tablet, Rfl: 4 .  metolazone (ZAROXOLYN) 5 MG tablet, Take 1 tablet (5 mg total) by mouth daily., Disp: 3 tablet, Rfl: 0 .  metoprolol tartrate (LOPRESSOR) 25 MG tablet, Take 0.5 tablets (12.5 mg total) by mouth 2 (two) times daily., Disp: 30 tablet, Rfl: 0 .  nitroGLYCERIN (NITROSTAT) 0.4 MG SL tablet, Place 1 tablet (0.4 mg total) under the tongue every 5 (five) minutes as needed for chest pain. Reported on 05/26/2016, Disp: 30 tablet, Rfl: 1 .  omega-3 acid ethyl esters (LOVAZA) 1 g capsule, TAKE FOUR CAPSULES BY MOUTH DAILY, Disp: 120 capsule, Rfl: 6 .   oxyCODONE-acetaminophen (PERCOCET) 10-325 MG tablet, Take 1 tablet by mouth every 4 (four) hours as needed., Disp: 150 tablet, Rfl: 0 .  potassium chloride SA (K-DUR,KLOR-CON) 20 MEQ tablet, Take 1 tablet (20 mEq total) by mouth daily., Disp: 30 tablet, Rfl: 5 .  benzonatate (TESSALON) 200 MG capsule, Take 1 capsule (200 mg total) by mouth 3 (three) times daily as needed for cough. (Patient not taking: Reported on 02/19/2017), Disp: 20 capsule, Rfl: 0 .  ranolazine (RANEXA) 1000 MG SR tablet, Take 1 tablet (1,000 mg total) by mouth 2 (two) times daily., Disp: 60  tablet, Rfl: 1 .  sotalol (BETAPACE) 120 MG tablet, Take 120 mg by mouth 2 (two) times daily. , Disp: , Rfl:  .  SPIRIVA HANDIHALER 18 MCG inhalation capsule, INHALE 1 CAPSULE VIA INHALER ONCE A DAY, Disp: 30 capsule, Rfl: 12 .  tamsulosin (FLOMAX) 0.4 MG CAPS capsule, Take 0.4 mg by mouth daily after breakfast. , Disp: , Rfl:  .  torsemide (DEMADEX) 100 MG tablet, Take 100 mg by mouth daily., Disp: , Rfl:   Review of Systems  Constitutional: Negative.   Respiratory: Positive for shortness of breath. Negative for apnea, choking and chest tightness.        Has COPD  Cardiovascular: Negative for chest pain, palpitations and leg swelling.  Neurological: Negative.     Social History  Substance Use Topics  . Smoking status: Former Smoker    Quit date: 04/22/2013  . Smokeless tobacco: Never Used  . Alcohol use No     Comment: remotely quit alcohol use. Hx of heavy alcohol use.   Objective:   BP 126/70 (BP Location: Left Arm, Patient Position: Sitting, Cuff Size: Normal)   Pulse 64   Temp 98.5 F (36.9 C)   Resp 20   Wt 230 lb (104.3 kg)   SpO2 95% Comment: with 2L of O2  BMI 36.02 kg/m     Physical Exam   General Appearance:    Alert, cooperative, no distress  Eyes:    PERRL, conjunctiva/corneas clear, EOM's intact       Lungs:     Clear to auscultation bilaterally, respirations unlabored  Heart:    Regular rate and rhythm    Neurologic:   Awake, alert, oriented x 3. No apparent focal neurological           defect.         Results for orders placed or performed in visit on 02/26/17  POCT glycosylated hemoglobin (Hb A1C)  Result Value Ref Range   Hemoglobin A1C 7.4    Est. average glucose Bld gHb Est-mCnc 166        Assessment & Plan:     1. Type 2 diabetes mellitus with other circulatory complication, without long-term current use of insulin (HCC) Add Jardiance 49m for cardiac benefit. Given 2 weeks samples. He is having labs done at CHF clinic on 03/04/2016. If stable will get prescription for 126mJardiance.  - POCT glycosylated hemoglobin (Hb A1C)  2. Hyperlipidemia, unspecified hyperlipidemia type He is tolerating atorvastatin well with no adverse effects.         DoLelon HuhMD  [facility]

## 2017-03-01 ENCOUNTER — Telehealth: Payer: Self-pay | Admitting: Cardiovascular Disease

## 2017-03-01 DIAGNOSIS — J449 Chronic obstructive pulmonary disease, unspecified: Secondary | ICD-10-CM | POA: Diagnosis not present

## 2017-03-01 DIAGNOSIS — I5033 Acute on chronic diastolic (congestive) heart failure: Secondary | ICD-10-CM | POA: Diagnosis not present

## 2017-03-01 DIAGNOSIS — I110 Hypertensive heart disease with heart failure: Secondary | ICD-10-CM | POA: Diagnosis not present

## 2017-03-01 DIAGNOSIS — E119 Type 2 diabetes mellitus without complications: Secondary | ICD-10-CM | POA: Diagnosis not present

## 2017-03-01 DIAGNOSIS — I4891 Unspecified atrial fibrillation: Secondary | ICD-10-CM | POA: Diagnosis not present

## 2017-03-01 DIAGNOSIS — I2510 Atherosclerotic heart disease of native coronary artery without angina pectoris: Secondary | ICD-10-CM | POA: Diagnosis not present

## 2017-03-01 NOTE — Telephone Encounter (Signed)
Pt sees Dr. [name] for cardiology. He is also a pt of Darylene Price, NP in the HF clinic.  Will forward to her for her review.

## 2017-03-01 NOTE — Telephone Encounter (Signed)
H/H followed up on cpap use  Patient has not been using at night   Patient is living with several family members in a hotel room and doesn't have access to outlet  [facility] nurse suggested priorities for devices to be plugged in at night also suggested obtaining a power strip   Patient talked about assistance for housing and would need an order for social work to start this process  Patient is HF patient and is eating convenience foods no access to cook or store food  Please call for verbal orders .

## 2017-03-02 NOTE — Telephone Encounter (Signed)
LM for Denyse Amass [facility]) in regards to order for social work so that patient can get moved out of the hotel and into traditional housing so that he can resume wearing his CPAP.

## 2017-03-04 ENCOUNTER — Encounter: Payer: Self-pay | Admitting: Family

## 2017-03-04 ENCOUNTER — Ambulatory Visit: Payer: Medicare HMO | Attending: Family | Admitting: Family

## 2017-03-04 VITALS — BP 138/77 | HR 66 | Resp 20 | Ht 67.0 in | Wt 236.2 lb

## 2017-03-04 DIAGNOSIS — I5033 Acute on chronic diastolic (congestive) heart failure: Secondary | ICD-10-CM | POA: Insufficient documentation

## 2017-03-04 DIAGNOSIS — Z885 Allergy status to narcotic agent status: Secondary | ICD-10-CM | POA: Diagnosis not present

## 2017-03-04 DIAGNOSIS — Z882 Allergy status to sulfonamides status: Secondary | ICD-10-CM | POA: Diagnosis not present

## 2017-03-04 DIAGNOSIS — Z7984 Long term (current) use of oral hypoglycemic drugs: Secondary | ICD-10-CM | POA: Insufficient documentation

## 2017-03-04 DIAGNOSIS — I4891 Unspecified atrial fibrillation: Secondary | ICD-10-CM | POA: Diagnosis not present

## 2017-03-04 DIAGNOSIS — G4733 Obstructive sleep apnea (adult) (pediatric): Secondary | ICD-10-CM

## 2017-03-04 DIAGNOSIS — Z7901 Long term (current) use of anticoagulants: Secondary | ICD-10-CM | POA: Diagnosis not present

## 2017-03-04 DIAGNOSIS — I471 Supraventricular tachycardia: Secondary | ICD-10-CM | POA: Insufficient documentation

## 2017-03-04 DIAGNOSIS — I5032 Chronic diastolic (congestive) heart failure: Secondary | ICD-10-CM

## 2017-03-04 DIAGNOSIS — R140 Abdominal distension (gaseous): Secondary | ICD-10-CM | POA: Insufficient documentation

## 2017-03-04 DIAGNOSIS — I2510 Atherosclerotic heart disease of native coronary artery without angina pectoris: Secondary | ICD-10-CM | POA: Insufficient documentation

## 2017-03-04 DIAGNOSIS — J449 Chronic obstructive pulmonary disease, unspecified: Secondary | ICD-10-CM | POA: Insufficient documentation

## 2017-03-04 DIAGNOSIS — E119 Type 2 diabetes mellitus without complications: Secondary | ICD-10-CM | POA: Insufficient documentation

## 2017-03-04 DIAGNOSIS — D649 Anemia, unspecified: Secondary | ICD-10-CM | POA: Diagnosis not present

## 2017-03-04 DIAGNOSIS — I110 Hypertensive heart disease with heart failure: Secondary | ICD-10-CM | POA: Insufficient documentation

## 2017-03-04 DIAGNOSIS — Z87891 Personal history of nicotine dependence: Secondary | ICD-10-CM | POA: Insufficient documentation

## 2017-03-04 DIAGNOSIS — Z79899 Other long term (current) drug therapy: Secondary | ICD-10-CM | POA: Diagnosis not present

## 2017-03-04 DIAGNOSIS — F419 Anxiety disorder, unspecified: Secondary | ICD-10-CM | POA: Insufficient documentation

## 2017-03-04 DIAGNOSIS — K900 Celiac disease: Secondary | ICD-10-CM | POA: Diagnosis not present

## 2017-03-04 DIAGNOSIS — Z888 Allergy status to other drugs, medicaments and biological substances status: Secondary | ICD-10-CM | POA: Insufficient documentation

## 2017-03-04 DIAGNOSIS — I10 Essential (primary) hypertension: Secondary | ICD-10-CM

## 2017-03-04 LAB — BASIC METABOLIC PANEL
Anion gap: 10 (ref 5–15)
BUN: 23 mg/dL — ABNORMAL HIGH (ref 6–20)
CO2: 31 mmol/L (ref 22–32)
Calcium: 9.7 mg/dL (ref 8.9–10.3)
Chloride: 97 mmol/L — ABNORMAL LOW (ref 101–111)
Creatinine, Ser: 1.39 mg/dL — ABNORMAL HIGH (ref 0.61–1.24)
GFR calc Af Amer: >60 mL/min (ref >60)
GFR calc non Af Amer: 52 mL/min — ABNORMAL LOW (ref >60)
Glucose, Bld: 201 mg/dL — ABNORMAL HIGH (ref 65–99)
Potassium: 3.6 mmol/L (ref 3.5–5.1)
Sodium: 138 mmol/L (ref 135–145)

## 2017-03-04 NOTE — Patient Instructions (Signed)
Continue weighing daily and call for an overnight weight gain of > 2 pounds or a weekly weight gain of >5 pounds. 

## 2017-03-04 NOTE — Progress Notes (Signed)
Patient ID: Lawrence [name], male    DOB: [birth year], 61 y.o.   MRN: [id number]  HPI  Lawrence [name] is a 61 y/o male with a history of DM, PSVT, HTN, COPD (chronic oxygen), CAD with CABG, asthma, anxiety, anemia, atrial fibrillation, obstructive sleep apnea,  past tobacco use and chronic heart failure.   Last echo was done 03/27/16 and showed an EF of 60-65% along with trivial Lawrence/TR. EF has improved from 45-50% November 2016. Most recent cardiac catheterization was done 11/02/16 and a DES was placed in the SVG to OM1 artery.   Was in the ED on 02/05/17 due to abrasions on both arms that wouldn't stop bleeding. Abrasions eventually stopped bleeding and he was discharged home. Was in the ED 11/30/16 due to the flu. Treated and discharged home. Admitted 10/29/16 due to chest pain. Cardiology consult obtained and catheterization done with DES placed. Discharged home after 5 days.   He presents today for his follow-up visit with a chief complaint of moderate shortness of breath with mild exertion. He says that this is chronic in nature and he's been experiencing it for a few years. Wearing his oxygen helps as well as stopping to rest. He has associated fatigue, abdominal distention, light-headedness and difficulty sleeping. Continues to live in a motel so is unable to wear his CPAP due to space issues.  Past Medical History:  Diagnosis Date  . A-fib ([place])   . Anemia   . Anxiety   . Asthma   . CAD (coronary artery disease)    a. 2002 CABGx2 (LIMA->LAD, VG->VG->OM1);  b. 09/2012 DES->OM;  c. 03/2015 PTCA of LAD [facility]) in setting of atretic LIMA; d. 05/2015 Cath [facility]): nonobs dzs; e. 06/2015 Cath ([facility]): LM nl, LAD 45p/d ISR, 50d, D1/2 small, LCX 50p/d ISR, OM1 70ost, 30 ISR, VG->OM1 50ost, 20m LIMA->LAD 99p/d - atretic, RCA dom, nl; f.cath 10/16: 40-50%(FFR 0.90) pLAD, 75% (FFR 0.77) mLAD s/p PCI/DES, oRCA 40% (FFR0.95)  . Celiac disease   . Chronic diastolic CHF (congestive heart failure) ([place]    a. 06/2009  Echo: EF 60-65%, Gr 1 DD, triv AI, mildly dil LA, nl RV.  .Marland KitchenCOPD (chronic obstructive pulmonary disease) ([place]    a. Chronic bronchitis and emphysema.  .Marland KitchenDysrhythmia   . Essential hypertension   . History of tobacco abuse    a. Quit 2014.  .Marland KitchenPSVT (paroxysmal supraventricular tachycardia) ([place]    a. 10/2012 Noted on Zio Patch.  . Type II diabetes mellitus ([place]    Past Surgical History:  Procedure Laterality Date  . BYPASS GRAFT    . CARDIAC CATHETERIZATION N/A 07/12/2015   rocedure: Left Heart Cath and Cors/Grafts Angiography;  Surgeon: HBelva Crome MD;  [place] [facility];  Service: Cardiovascular;  Laterality: N/A;  . CARDIAC CATHETERIZATION Right 10/07/2015   Procedure: Left Heart Cath and Cors/Grafts Angiography;  Surgeon: SDionisio David MD;  [place];  Service: Cardiovascular;  Laterality: Right;  . CARDIAC CATHETERIZATION N/A 04/06/2016   Procedure: Left Heart Cath and Coronary Angiography;  Surgeon: DYolonda Kida MD;  [place];  Service: Cardiovascular;  Laterality: N/A;  . CARDIAC CATHETERIZATION  04/06/2016   Procedure: Bypass Graft Angiography;  Surgeon: DYolonda Kida MD;  [place];  Service: Cardiovascular;;  . CARDIAC CATHETERIZATION N/A 11/02/2016   Procedure: Left Heart Cath and Cors/Grafts Angiography and possible PCI;  Surgeon: DYolonda Kida MD;  [place]  [facility];  Service: Cardiovascular;  Laterality: N/A;  . CARDIAC CATHETERIZATION N/A 11/02/2016   Procedure: Coronary Stent Intervention;  Surgeon: Yolonda Kida, MD;  Location: [place] [facility] LAB;  Service: Cardiovascular;  Laterality: N/A;  . CHOLECYSTECTOMY    . ESOPHAGEAL DILATION    . TONSILLECTOMY    . VASCULAR SURGERY     Family History  Problem Relation Age of Onset  . Heart attack Mother   . Depression Mother   . Heart disease Mother   . COPD Mother   . Hypertension Mother   . Heart attack  Father   . Diabetes Father   . Depression Father   . Heart disease Father   . Cirrhosis Father   . Parkinson's disease Brother    Social History  Substance Use Topics  . Smoking status: Former Smoker    Quit date: 04/22/2013  . Smokeless tobacco: Never Used  . Alcohol use No     Comment: remotely quit alcohol use. Hx of heavy alcohol use.   Allergies  Allergen Reactions  . Demerol [Meperidine] Hives  . Prednisone Other (See Comments) and Hypertension    Pt states that this medication puts him in A-fib   . Sulfa Antibiotics Hives  . Albuterol Sulfate [Albuterol] Palpitations and Other (See Comments)    Pt currently uses this medication.    . Morphine Sulfate Nausea And Vomiting, Rash and Other (See Comments)    Pt states that he is only allergic to the tablet form of this medication.     Prior to Admission medications   Medication Sig Start Date End Date Taking? Authorizing Provider  allopurinol (ZYLOPRIM) 100 MG tablet THREE (3) TABLETS TWICE DAILY 04/25/16  Yes Birdie Sons, MD  ALPRAZolam Duanne Moron) 1 MG tablet TAKE ONE-HALF TO ONE TABLET BY MOUTH THREE TIMES DAILY AS NEEDED FOR ANXIETY 01/11/17  Yes Birdie Sons, MD         apixaban (ELIQUIS) 5 MG TABS tablet Take 5 mg by mouth 2 (two) times daily.   Yes Historical Provider, MD  atorvastatin (LIPITOR) 80 MG tablet TAKE 1 TABLET BY MOUTH EVERY NIGHT AT BEDTIME. 05/26/16  Yes Birdie Sons, MD  cetirizine (ZYRTEC) 10 MG tablet TAKE ONE TABLET AT BEDTIME 04/19/16  Yes Birdie Sons, MD  clopidogrel (PLAVIX) 75 MG tablet TAKE 1 TABLET BY MOUTH DAILY WITH BREAKFAST 01/25/17  Yes Birdie Sons, MD  cyclobenzaprine (FLEXERIL) 10 MG tablet TAKE ONE TABLET BY MOUTH EVERY 8 HOURS AS NEEDED FOR MUSCLE SPASM 12/01/16  Yes Birdie Sons, MD  docusate sodium (COLACE) 100 MG capsule Take 100 mg by mouth 2 (two) times daily.   Yes Historical Provider, MD  fluticasone furoate-vilanterol (BREO ELLIPTA) 100-25 MCG/INH AEPB Inhale 1 puff into  the lungs daily.    Yes Historical Provider, MD  glucose blood (ACCU-CHEK AVIVA PLUS) test strip Use to check blood sugar once a day 11/26/16  Yes Birdie Sons, MD  isosorbide mononitrate (IMDUR) 60 MG 24 hr tablet Take 1 tablet (60 mg total) by mouth daily. 11/04/16  Yes Henreitta Leber, MD  lansoprazole (PREVACID) 30 MG capsule Take 30 mg by mouth 2 (two) times daily.    Yes Historical Provider, MD  magnesium oxide (MAG-OX) 400 (241.3 Mg) MG tablet Take 400 mg by mouth daily.   Yes Historical Provider, MD  metFORMIN (GLUCOPHAGE) 500 MG tablet TAKE 1 TABLET BY MOUTH EVERY MORNING. 10/05/16  Yes Birdie Sons, MD  metoprolol  tartrate (LOPRESSOR) 25 MG tablet Take 0.5 tablets (12.5 mg total) by mouth 2 (two) times daily. Patient taking differently: Take 25 mg by mouth daily.  06/26/16  Yes Epifanio Lesches, MD  nitroGLYCERIN (NITROSTAT) 0.4 MG SL tablet Place 1 tablet (0.4 mg total) under the tongue every 5 (five) minutes as needed for chest pain. Reported on 05/26/2016 10/06/16  Yes Birdie Sons, MD  omega-3 acid ethyl esters (LOVAZA) 1 g capsule TAKE FOUR CAPSULES BY MOUTH DAILY 07/25/16  Yes Birdie Sons, MD  oxyCODONE-acetaminophen (PERCOCET) 10-325 MG tablet Take 1 tablet by mouth every 4 (four) hours as needed. 01/28/17  Yes Birdie Sons, MD  ranolazine (RANEXA) 1000 MG SR tablet Take 1 tablet (1,000 mg total) by mouth 2 (two) times daily. 11/03/16  Yes Henreitta Leber, MD  sotalol (BETAPACE) 120 MG tablet Take 120 mg by mouth 2 (two) times daily.    Yes Historical Provider, MD  SPIRIVA HANDIHALER 18 MCG inhalation capsule INHALE 1 CAPSULE VIA INHALER ONCE A DAY 01/25/17  Yes Birdie Sons, MD  torsemide (DEMADEX) 100 MG tablet Take 100 mg by mouth daily.   Yes Historical Provider, MD  albuterol (PROVENTIL HFA;VENTOLIN HFA) 108 (90 Base) MCG/ACT inhaler Inhale 4-6 puffs into the lungs every 4 (four) hours as needed for wheezing or shortness of breath. Reported on 04/08/2016     Historical Provider, MD  benzonatate (TESSALON) 200 MG capsule Take 1 capsule (200 mg total) by mouth 3 (three) times daily as needed for cough. 11/30/16   Sable Feil, PA-C  tamsulosin (FLOMAX) 0.4 MG CAPS capsule Take 0.4 mg by mouth daily after breakfast.     Historical Provider, MD     Review of Systems  Constitutional: Positive for fatigue. Negative for appetite change.  HENT: Negative for congestion, rhinorrhea and sore throat.   Eyes: Negative.   Respiratory: Positive for shortness of breath (when walking long distances). Negative for chest tightness and wheezing.   Cardiovascular: Negative for chest pain and leg swelling.  Gastrointestinal: Positive for abdominal distention (improving) and abdominal pain.  Endocrine: Negative.   Genitourinary: Negative.   Musculoskeletal: Positive for back pain (chronic pain). Negative for neck pain.  Skin: Negative.   Allergic/Immunologic: Negative.   Neurological: Positive for light-headedness (with position changes). Negative for dizziness.  Hematological: Negative for adenopathy. Bruises/bleeds easily.  Psychiatric/Behavioral: Positive for sleep disturbance (wearing oxygen at 2L; not wearing CPAP due to space issues). Negative for dysphoric mood and suicidal ideas. The patient is not nervous/anxious.    Vitals:   03/04/17 1117  BP: 138/77  Pulse: 66  Resp: 20  SpO2: 94%  Weight: 236 lb 4 oz (107.2 kg)  Height: 5' 7"  (1.702 m)   Wt Readings from Last 3 Encounters:  03/04/17 236 lb 4 oz (107.2 kg)  02/26/17 230 lb (104.3 kg)  02/19/17 242 lb 2 oz (109.8 kg)   Lab Results  Component Value Date   CREATININE 0.99 02/19/2017   CREATININE 1.09 02/11/2017   CREATININE 1.17 10/30/2016    Physical Exam  Constitutional: He is oriented to person, place, and time. He appears well-developed and well-nourished.  HENT:  Head: Normocephalic and atraumatic.  Eyes: Conjunctivae are normal. Pupils are equal, round, and reactive to light.   Neck: Normal range of motion. Neck supple. No JVD present.  Cardiovascular: Normal rate and regular rhythm.   Pulmonary/Chest: Effort normal. He has no wheezes. He has no rales.  Abdominal: Soft. He exhibits distension. There is  no tenderness.  Musculoskeletal: He exhibits no edema or tenderness.  Neurological: He is alert and oriented to person, place, and time.  Skin: Skin is warm and dry.  Psychiatric: He has a normal mood and affect. His behavior is normal. Thought content normal.  Nursing note and vitals reviewed.   Assessment & Plan:  1: Acute heart failure with preserved ejection fraction- - NYHA class III - minimal fluid overload today with abdominal distention - weighing daily. Instructed to call for an overnight weight gain of >2 pounds or a weekly weight gain of >5 pounds. Says that he's gained a few pounds this week. Should his weight go up tonight, he's to call us tomorrow as he may need metolazone again.  - not adding salt and is currently rinsing canned foods. Reviewed the importance of closely following a 2090m sodium diet as he's eating quite a bit of microwave meals since he's still living in a motel - unsure of fluid intake and he was instructed to drink between 40-50 ounces of fluid daily. (~2L) - will check a BMP today  - saw cardiologist ([name] 12/18/16 and [facility] him May 2018 [facility] Social worker involved to try and help with his housing arrangements.   2: HTN- - BP looks good  - saw PCP ([name] 02/26/17 - jardiance 157mdaily has been started by PCP  3: Obstructive sleep apnea- - wears oxygen at 2L around the clock - has CPAP equipment but it's currently in storage as he's been living in a hotel with family for the last year and doesn't have any space to place the machine - discussed the relationship between untreated sleep apnea and heart failure and strongly encouraged him to find a place to put the equipment so  that he can resume wearing his CPAP  Medication bottles were reviewed.  Return here in 1 month or sooner for any questions/problems before then.

## 2017-03-05 ENCOUNTER — Other Ambulatory Visit: Payer: Self-pay | Admitting: Family

## 2017-03-05 ENCOUNTER — Telehealth: Payer: Self-pay | Admitting: Family Medicine

## 2017-03-05 ENCOUNTER — Telehealth: Payer: Self-pay | Admitting: Family

## 2017-03-05 MED ORDER — POTASSIUM CHLORIDE CRYS ER 20 MEQ PO TBCR
20.0000 meq | EXTENDED_RELEASE_TABLET | Freq: Every day | ORAL | 5 refills | Status: DC
Start: 2017-03-05 — End: 2017-10-01

## 2017-03-05 MED ORDER — EMPAGLIFLOZIN 10 MG PO TABS: 10.0000 mg | ORAL_TABLET | Freq: Every day | ORAL | 3 refills | Status: DC

## 2017-03-05 MED ORDER — METOLAZONE 5 MG PO TABS: 5.0000 mg | ORAL_TABLET | Freq: Every day | ORAL | 0 refills | Status: DC

## 2017-03-05 NOTE — Telephone Encounter (Signed)
-----   Message from Birdie Sons, MD sent at 02/26/2017  5:07 PM EDT ----- Regarding: follow up labs from [facility] clinic Needs more Jardiance if stable

## 2017-03-05 NOTE — Telephone Encounter (Signed)
RX sent in and pt advised-aa

## 2017-03-05 NOTE — Telephone Encounter (Signed)
Patient called to review lab work and to tell him that results were faxed to his PCP's office per his request. He says that his weight has gone up again today. Yesterday he weighed 235 pounds and today he is 237 pounds.  Will give him 3 days of 52m metolazone to take daily along with additional potassium. Will see him for follow-up on 03/10/17 and will recheck labs at that time. May need to consider decreasing torsemide dose if it appears that he's not responding to it.

## 2017-03-05 NOTE — Telephone Encounter (Signed)
Please advise patient that kidney functions and electrolytes look and he should stay on Jardiance, please send in prescription for 64m once a day, #30, rf x 3 to pharmacy of his choice. Cal if any problems filling prescription follow up in July as scheduled.

## 2017-03-08 DIAGNOSIS — I110 Hypertensive heart disease with heart failure: Secondary | ICD-10-CM | POA: Diagnosis not present

## 2017-03-08 DIAGNOSIS — I2510 Atherosclerotic heart disease of native coronary artery without angina pectoris: Secondary | ICD-10-CM | POA: Diagnosis not present

## 2017-03-08 DIAGNOSIS — I4891 Unspecified atrial fibrillation: Secondary | ICD-10-CM | POA: Diagnosis not present

## 2017-03-08 DIAGNOSIS — E119 Type 2 diabetes mellitus without complications: Secondary | ICD-10-CM | POA: Diagnosis not present

## 2017-03-08 DIAGNOSIS — J449 Chronic obstructive pulmonary disease, unspecified: Secondary | ICD-10-CM | POA: Diagnosis not present

## 2017-03-08 DIAGNOSIS — I5033 Acute on chronic diastolic (congestive) heart failure: Secondary | ICD-10-CM | POA: Diagnosis not present

## 2017-03-10 ENCOUNTER — Telehealth: Payer: Self-pay | Admitting: Family

## 2017-03-10 ENCOUNTER — Ambulatory Visit: Payer: Medicare HMO | Attending: Family | Admitting: Family

## 2017-03-10 ENCOUNTER — Encounter: Payer: Self-pay | Admitting: Family

## 2017-03-10 VITALS — BP 103/56 | HR 56 | Resp 20 | Ht 67.0 in | Wt 232.5 lb

## 2017-03-10 DIAGNOSIS — Z7901 Long term (current) use of anticoagulants: Secondary | ICD-10-CM | POA: Insufficient documentation

## 2017-03-10 DIAGNOSIS — I4891 Unspecified atrial fibrillation: Secondary | ICD-10-CM | POA: Diagnosis not present

## 2017-03-10 DIAGNOSIS — Z9981 Dependence on supplemental oxygen: Secondary | ICD-10-CM | POA: Insufficient documentation

## 2017-03-10 DIAGNOSIS — I110 Hypertensive heart disease with heart failure: Secondary | ICD-10-CM | POA: Insufficient documentation

## 2017-03-10 DIAGNOSIS — I5032 Chronic diastolic (congestive) heart failure: Secondary | ICD-10-CM | POA: Diagnosis not present

## 2017-03-10 DIAGNOSIS — G4733 Obstructive sleep apnea (adult) (pediatric): Secondary | ICD-10-CM | POA: Diagnosis not present

## 2017-03-10 DIAGNOSIS — Z951 Presence of aortocoronary bypass graft: Secondary | ICD-10-CM | POA: Diagnosis not present

## 2017-03-10 DIAGNOSIS — Z882 Allergy status to sulfonamides status: Secondary | ICD-10-CM | POA: Diagnosis not present

## 2017-03-10 DIAGNOSIS — R42 Dizziness and giddiness: Secondary | ICD-10-CM | POA: Diagnosis not present

## 2017-03-10 DIAGNOSIS — Z888 Allergy status to other drugs, medicaments and biological substances status: Secondary | ICD-10-CM | POA: Diagnosis not present

## 2017-03-10 DIAGNOSIS — Z818 Family history of other mental and behavioral disorders: Secondary | ICD-10-CM | POA: Insufficient documentation

## 2017-03-10 DIAGNOSIS — J449 Chronic obstructive pulmonary disease, unspecified: Secondary | ICD-10-CM | POA: Diagnosis not present

## 2017-03-10 DIAGNOSIS — Z825 Family history of asthma and other chronic lower respiratory diseases: Secondary | ICD-10-CM | POA: Diagnosis not present

## 2017-03-10 DIAGNOSIS — E119 Type 2 diabetes mellitus without complications: Secondary | ICD-10-CM | POA: Insufficient documentation

## 2017-03-10 DIAGNOSIS — Z8249 Family history of ischemic heart disease and other diseases of the circulatory system: Secondary | ICD-10-CM | POA: Insufficient documentation

## 2017-03-10 DIAGNOSIS — I5033 Acute on chronic diastolic (congestive) heart failure: Secondary | ICD-10-CM | POA: Diagnosis not present

## 2017-03-10 DIAGNOSIS — Z885 Allergy status to narcotic agent status: Secondary | ICD-10-CM | POA: Insufficient documentation

## 2017-03-10 DIAGNOSIS — K900 Celiac disease: Secondary | ICD-10-CM | POA: Insufficient documentation

## 2017-03-10 DIAGNOSIS — Z7984 Long term (current) use of oral hypoglycemic drugs: Secondary | ICD-10-CM | POA: Diagnosis not present

## 2017-03-10 DIAGNOSIS — Z79899 Other long term (current) drug therapy: Secondary | ICD-10-CM | POA: Insufficient documentation

## 2017-03-10 DIAGNOSIS — Z7951 Long term (current) use of inhaled steroids: Secondary | ICD-10-CM | POA: Diagnosis not present

## 2017-03-10 DIAGNOSIS — Z87891 Personal history of nicotine dependence: Secondary | ICD-10-CM | POA: Diagnosis not present

## 2017-03-10 DIAGNOSIS — I2510 Atherosclerotic heart disease of native coronary artery without angina pectoris: Secondary | ICD-10-CM | POA: Diagnosis not present

## 2017-03-10 DIAGNOSIS — I10 Essential (primary) hypertension: Secondary | ICD-10-CM

## 2017-03-10 DIAGNOSIS — F419 Anxiety disorder, unspecified: Secondary | ICD-10-CM | POA: Insufficient documentation

## 2017-03-10 LAB — BASIC METABOLIC PANEL
Anion gap: 12 (ref 5–15)
BUN: 46 mg/dL — ABNORMAL HIGH (ref 6–20)
CO2: 34 mmol/L — ABNORMAL HIGH (ref 22–32)
Calcium: 9.8 mg/dL (ref 8.9–10.3)
Chloride: 89 mmol/L — ABNORMAL LOW (ref 101–111)
Creatinine, Ser: 2 mg/dL — ABNORMAL HIGH (ref 0.61–1.24)
GFR calc Af Amer: 39 mL/min — ABNORMAL LOW (ref >60)
GFR calc non Af Amer: 34 mL/min — ABNORMAL LOW (ref >60)
Glucose, Bld: 169 mg/dL — ABNORMAL HIGH (ref 65–99)
Potassium: 3.6 mmol/L (ref 3.5–5.1)
Sodium: 135 mmol/L (ref 135–145)

## 2017-03-10 NOTE — Telephone Encounter (Signed)
Reviewed lab work that was drawn today (03/10/17). Potassium and sodium levels are normal. Renal function has worsened however. Patient is currently taking 170m torsemide daily. He was instructed to decrease dosage to 539mdaily (he has 10010mablets) and to make sure he monitors weight/edema closely.   He was also instructed to measure the volume in the large cup that he's drinking out of so that he can maintain his fluid intake between 40-50 ounces of fluid daily.   He is to return in 2 weeks for a recheck and to recheck his labs at that time. Should he develop weight gain/edema/worsening shortness of breath, he is to call back before his appointment.   Patient verbalized understanding of instructions.

## 2017-03-10 NOTE — Progress Notes (Signed)
Patient ID: Lawrence [name], male    DOB: 1955-12-27, 61 y.o.   MRN: [id number]  HPI  [name] is a 61 y/o male with a history of DM, PSVT, HTN, COPD (chronic oxygen), CAD with CABG, asthma, anxiety, anemia, atrial fibrillation, obstructive sleep apnea,  past tobacco use and chronic heart failure.   Last echo was done 03/27/16 and showed an EF of 60-65% along with trivial MR/TR. EF has improved from 45-50% November 2016. Most recent cardiac catheterization was done 11/02/16 and a DES was placed in the SVG to OM1 artery.   Was in the ED on 02/05/17 due to abrasions on both arms that wouldn't stop bleeding. Abrasions eventually stopped bleeding and he was discharged home. Was in the ED 11/30/16 due to the flu. Treated and discharged home. Admitted 10/29/16 due to chest pain. Cardiology consult obtained and catheterization done with DES placed. Discharged home after 5 days.   He presents today for his follow-up visit with a chief complaint of moderate shortness of breath with mild exertion. He says that this is chronic in nature and he's been experiencing it for a few years. Wearing his oxygen helps as well as stopping to rest. He has associated fatigue, abdominal distention, light-headedness, nausea and difficulty sleeping. Continues to live in a motel so is unable to wear his CPAP due to space issues.  Past Medical History:  Diagnosis Date  . A-fib ([place])   . Anemia   . Anxiety   . Asthma   . CAD (coronary artery disease)    a. 2002 CABGx2 (LIMA->LAD, VG->VG->OM1);  b. 09/2012 DES->OM;  c. 03/2015 PTCA of LAD [facility]) in setting of atretic LIMA; d. 05/2015 Cath [facility]): nonobs dzs; e. 06/2015 Cath ([facility]): LM nl, LAD 45p/d ISR, 50d, D1/2 small, LCX 50p/d ISR, OM1 70ost, 30 ISR, VG->OM1 50ost, 38m LIMA->LAD 99p/d - atretic, RCA dom, nl; f.cath 10/16: 40-50%(FFR 0.90) pLAD, 75% (FFR 0.77) mLAD s/p PCI/DES, oRCA 40% (FFR0.95)  . Celiac disease   . Chronic diastolic CHF (congestive heart failure) ([place]    a.  06/2009 Echo: EF 60-65%, Gr 1 DD, triv AI, mildly dil LA, nl RV.  .Marland KitchenCOPD (chronic obstructive pulmonary disease) ([place]    a. Chronic bronchitis and emphysema.  .Marland KitchenDysrhythmia   . Essential hypertension   . History of tobacco abuse    a. Quit 2014.  .Marland KitchenPSVT (paroxysmal supraventricular tachycardia) ([place]    a. 10/2012 Noted on Zio Patch.  . Type II diabetes mellitus ([place]    Past Surgical History:  Procedure Laterality Date  . BYPASS GRAFT    . CARDIAC CATHETERIZATION N/A 07/12/2015   rocedure: Left Heart Cath and Cors/Grafts Angiography;  Surgeon: HBelva Crome MD;  [place];  Service: Cardiovascular;  Laterality: N/A;  . CARDIAC CATHETERIZATION Right 10/07/2015   Procedure: Left Heart Cath and Cors/Grafts Angiography;  Surgeon: SDionisio David MD;  [place];  Service: Cardiovascular;  Laterality: Right;  . CARDIAC CATHETERIZATION N/A 04/06/2016   Procedure: Left Heart Cath and Coronary Angiography;  Surgeon: DYolonda Kida MD;  [place] [facility];  Service: Cardiovascular;  Laterality: N/A;  . CARDIAC CATHETERIZATION  04/06/2016   Procedure: Bypass Graft Angiography;  Surgeon: DYolonda Kida MD;  [place] [place];  Service: Cardiovascular;;  . CARDIAC CATHETERIZATION N/A 11/02/2016   Procedure: Left Heart Cath and Cors/Grafts Angiography and possible PCI;  Surgeon: DYolonda Kida MD;  [facility];  Service: Cardiovascular;  Laterality: N/A;  . CARDIAC CATHETERIZATION N/A 11/02/2016   Procedure: Coronary Stent Intervention;  Surgeon: Yolonda Kida, MD;  Location: [place] CV LAB;  Service: Cardiovascular;  Laterality: N/A;  . CHOLECYSTECTOMY    . ESOPHAGEAL DILATION    . TONSILLECTOMY    . VASCULAR SURGERY     Family History  Problem Relation Age of Onset  . Heart attack Mother   . Depression Mother   . Heart disease Mother   . COPD Mother   . Hypertension Mother   . Heart  attack Father   . Diabetes Father   . Depression Father   . Heart disease Father   . Cirrhosis Father   . Parkinson's disease Brother    Social History  Substance Use Topics  . Smoking status: Former Smoker    Quit date: 04/22/2013  . Smokeless tobacco: Never Used  . Alcohol use No     Comment: remotely quit alcohol use. Hx of heavy alcohol use.   Allergies  Allergen Reactions  . Demerol [Meperidine] Hives  . Prednisone Other (See Comments) and Hypertension    Pt states that this medication puts him in A-fib   . Sulfa Antibiotics Hives  . Albuterol Sulfate [Albuterol] Palpitations and Other (See Comments)    Pt currently uses this medication.    . Morphine Sulfate Nausea And Vomiting, Rash and Other (See Comments)    Pt states that he is only allergic to the tablet form of this medication.     Prior to Admission medications   Medication Sig Start Date End Date Taking? Authorizing Provider  allopurinol (ZYLOPRIM) 100 MG tablet THREE (3) TABLETS TWICE DAILY 04/25/16  Yes Birdie Sons, MD  ALPRAZolam Duanne Moron) 1 MG tablet TAKE ONE-HALF TO ONE TABLET BY MOUTH THREE TIMES DAILY AS NEEDED FOR ANXIETY 01/11/17  Yes Birdie Sons, MD         apixaban (ELIQUIS) 5 MG TABS tablet Take 5 mg by mouth 2 (two) times daily.   Yes Historical Provider, MD  atorvastatin (LIPITOR) 80 MG tablet TAKE 1 TABLET BY MOUTH EVERY NIGHT AT BEDTIME. 05/26/16  Yes Birdie Sons, MD  cetirizine (ZYRTEC) 10 MG tablet TAKE ONE TABLET AT BEDTIME 04/19/16  Yes Birdie Sons, MD  clopidogrel (PLAVIX) 75 MG tablet TAKE 1 TABLET BY MOUTH DAILY WITH BREAKFAST 01/25/17  Yes Birdie Sons, MD  cyclobenzaprine (FLEXERIL) 10 MG tablet TAKE ONE TABLET BY MOUTH EVERY 8 HOURS AS NEEDED FOR MUSCLE SPASM 12/01/16  Yes Birdie Sons, MD  docusate sodium (COLACE) 100 MG capsule Take 100 mg by mouth 2 (two) times daily.   Yes Historical Provider, MD  fluticasone furoate-vilanterol (BREO ELLIPTA) 100-25 MCG/INH AEPB Inhale 1 puff  into the lungs daily.    Yes Historical Provider, MD  glucose blood (ACCU-CHEK AVIVA PLUS) test strip Use to check blood sugar once a day 11/26/16  Yes Birdie Sons, MD  isosorbide mononitrate (IMDUR) 60 MG 24 hr tablet Take 1 tablet (60 mg total) by mouth daily. 11/04/16  Yes Henreitta Leber, MD  lansoprazole (PREVACID) 30 MG capsule Take 30 mg by mouth 2 (two) times daily.    Yes Historical Provider, MD  magnesium oxide (MAG-OX) 400 (241.3 Mg) MG tablet Take 400 mg by mouth daily.   Yes Historical Provider, MD  metFORMIN (GLUCOPHAGE) 500 MG tablet TAKE 1 TABLET BY MOUTH EVERY MORNING. 10/05/16  Yes Birdie Sons, MD  metoprolol tartrate (LOPRESSOR) 25 MG tablet Take 0.5 tablets (12.5 mg total) by mouth 2 (two) times daily. Patient taking differently: Take 25 mg by mouth daily.  06/26/16  Yes Epifanio Lesches, MD  nitroGLYCERIN (NITROSTAT) 0.4 MG SL tablet Place 1 tablet (0.4 mg total) under the tongue every 5 (five) minutes as needed for chest pain. Reported on 05/26/2016 10/06/16  Yes Birdie Sons, MD  omega-3 acid ethyl esters (LOVAZA) 1 g capsule TAKE FOUR CAPSULES BY MOUTH DAILY 07/25/16  Yes Birdie Sons, MD  oxyCODONE-acetaminophen (PERCOCET) 10-325 MG tablet Take 1 tablet by mouth every 4 (four) hours as needed. 01/28/17  Yes Birdie Sons, MD  ranolazine (RANEXA) 1000 MG SR tablet Take 1 tablet (1,000 mg total) by mouth 2 (two) times daily. 11/03/16  Yes Henreitta Leber, MD  sotalol (BETAPACE) 120 MG tablet Take 120 mg by mouth 2 (two) times daily.    Yes Historical Provider, MD  SPIRIVA HANDIHALER 18 MCG inhalation capsule INHALE 1 CAPSULE VIA INHALER ONCE A DAY 01/25/17  Yes Birdie Sons, MD  torsemide (DEMADEX) 100 MG tablet Take 100 mg by mouth daily.   Yes Historical Provider, MD  albuterol (PROVENTIL HFA;VENTOLIN HFA) 108 (90 Base) MCG/ACT inhaler Inhale 4-6 puffs into the lungs every 4 (four) hours as needed for wheezing or shortness of breath. Reported on 04/08/2016     Historical Provider, MD  benzonatate (TESSALON) 200 MG capsule Take 1 capsule (200 mg total) by mouth 3 (three) times daily as needed for cough. 11/30/16   Sable Feil, PA-C  tamsulosin (FLOMAX) 0.4 MG CAPS capsule Take 0.4 mg by mouth daily after breakfast.     Historical Provider, MD     Review of Systems  Constitutional: Positive for fatigue. Negative for appetite change.  HENT: Negative for congestion, rhinorrhea and sore throat.   Eyes: Negative.   Respiratory: Positive for shortness of breath. Negative for cough, chest tightness and wheezing.   Cardiovascular: Negative for chest pain, palpitations and leg swelling.  Gastrointestinal: Positive for abdominal pain (tenderness above umbilicus at times), constipation (at times) and nausea. Negative for abdominal distention.  Endocrine: Negative.   Genitourinary: Negative.   Musculoskeletal: Positive for back pain. Negative for neck pain.  Skin: Negative.   Allergic/Immunologic: Negative.   Neurological: Positive for weakness and light-headedness (with position changes).  Hematological: Negative for adenopathy. Bruises/bleeds easily.  Psychiatric/Behavioral: Positive for sleep disturbance (wearing oxygen at 2L; not wearing CPAP due to space issues). Negative for dysphoric mood and suicidal ideas. The patient is not nervous/anxious.    Vitals:   03/10/17 0902  BP: (!) 103/56  Pulse: (!) 56  Resp: 20  SpO2: 96%  Weight: 232 lb 8 oz (105.5 kg)  Height: 5' 7"  (1.702 m)   Wt Readings from Last 3 Encounters:  03/10/17 232 lb 8 oz (105.5 kg)  03/04/17 236 lb 4 oz (107.2 kg)  02/26/17 230 lb (104.3 kg)   Lab Results  Component Value Date   CREATININE 1.39 (H) 03/04/2017   CREATININE 0.99 02/19/2017   CREATININE 1.09 02/11/2017    Physical Exam  Constitutional: He is oriented to person, place, and time. He appears well-developed and well-nourished.  HENT:  Head: Normocephalic and atraumatic.  Neck: Normal range of motion. Neck  supple. No JVD present.  Cardiovascular: Normal rate and regular rhythm.   Pulmonary/Chest: Effort normal. He has no wheezes. He has no rales.  Abdominal: Soft. He exhibits distension. There is tenderness (above umbilicus).  Musculoskeletal: He exhibits no edema or tenderness.  Neurological: He is alert and oriented to person, place, and time.  Skin: Skin is warm and dry.  Psychiatric: He has a normal mood and affect. His behavior is normal. Thought content normal.  Nursing note and vitals reviewed.   Assessment & Plan:  1: Chronic heart failure with preserved ejection fraction- - NYHA class III - minimal fluid overload today with abdominal distention - weighing daily. Instructed to call for an overnight weight gain of >2 pounds or a weekly weight gain of >5 pounds. Reviewed home weight chart and his weight has declined from 238 pounds to 230 pounds today. By our scale, he's lost 4 pounds since he was last here.  - not adding salt and is currently rinsing canned foods. Reviewed the importance of closely following a 20109m sodium diet as he's eating quite a bit of microwave meals since he's still living in a motel - still hasn't measured how much he's drinking. Says that he's drinking 3 large cups of water daily but, again, can't quantify how much the cup holds. Needs to keep fluid intake to 40-50 ounces daily - will check a BMP today  - may need to decrease torsemide depending on what lab results show - saw cardiologist ([name] 12/18/16 and [facility] him May 2018 [facility] Social worker involved to try and help with his housing arrangements.   2: HTN- - BP on the low side and he's experiencing some dizziness; checking a BMP today  - saw PCP ([name] 02/26/17 - jardiance [id number] has been started by PCP  3: Obstructive sleep apnea- - wears oxygen at 2L around the clock - has CPAP equipment but it's currently in storage as he's been living in a  hotel with family for the last year and doesn't have any space to place the machine - discussed the relationship between untreated sleep apnea and heart failure and strongly encouraged him to find a place to put the equipment so that he can resume wearing his CPAP  Patient did not bring his medications nor a list. Each medication was verbally reviewed with the patient and he was encouraged to bring the bottles to every visit to confirm accuracy of list.  Return here in 1 month or sooner for any questions/problems before then.

## 2017-03-10 NOTE — Patient Instructions (Signed)
Continue weighing daily and call for an overnight weight gain of > 2 pounds or a weekly weight gain of >5 pounds. 

## 2017-03-12 DIAGNOSIS — I10 Essential (primary) hypertension: Secondary | ICD-10-CM | POA: Diagnosis not present

## 2017-03-12 DIAGNOSIS — J439 Emphysema, unspecified: Secondary | ICD-10-CM | POA: Diagnosis not present

## 2017-03-12 DIAGNOSIS — G4710 Hypersomnia, unspecified: Secondary | ICD-10-CM | POA: Diagnosis not present

## 2017-03-12 DIAGNOSIS — J449 Chronic obstructive pulmonary disease, unspecified: Secondary | ICD-10-CM | POA: Diagnosis not present

## 2017-03-16 ENCOUNTER — Telehealth: Payer: Self-pay | Admitting: Family

## 2017-03-16 DIAGNOSIS — I2510 Atherosclerotic heart disease of native coronary artery without angina pectoris: Secondary | ICD-10-CM | POA: Diagnosis not present

## 2017-03-16 DIAGNOSIS — I5033 Acute on chronic diastolic (congestive) heart failure: Secondary | ICD-10-CM | POA: Diagnosis not present

## 2017-03-16 DIAGNOSIS — E119 Type 2 diabetes mellitus without complications: Secondary | ICD-10-CM | POA: Diagnosis not present

## 2017-03-16 DIAGNOSIS — I110 Hypertensive heart disease with heart failure: Secondary | ICD-10-CM | POA: Diagnosis not present

## 2017-03-16 DIAGNOSIS — J449 Chronic obstructive pulmonary disease, unspecified: Secondary | ICD-10-CM | POA: Diagnosis not present

## 2017-03-16 DIAGNOSIS — I4891 Unspecified atrial fibrillation: Secondary | ICD-10-CM | POA: Diagnosis not present

## 2017-03-16 IMAGING — CR DG ABDOMEN 2V
3 series · 3 of 3 positions shown · non-contrast
Comparison: CT abdomen and pelvis March 07, 2015.

CLINICAL DATA: Abdominal pain with bloating for several days

EXAM:
ABDOMEN - 2 VIEW

[abdomen erect]
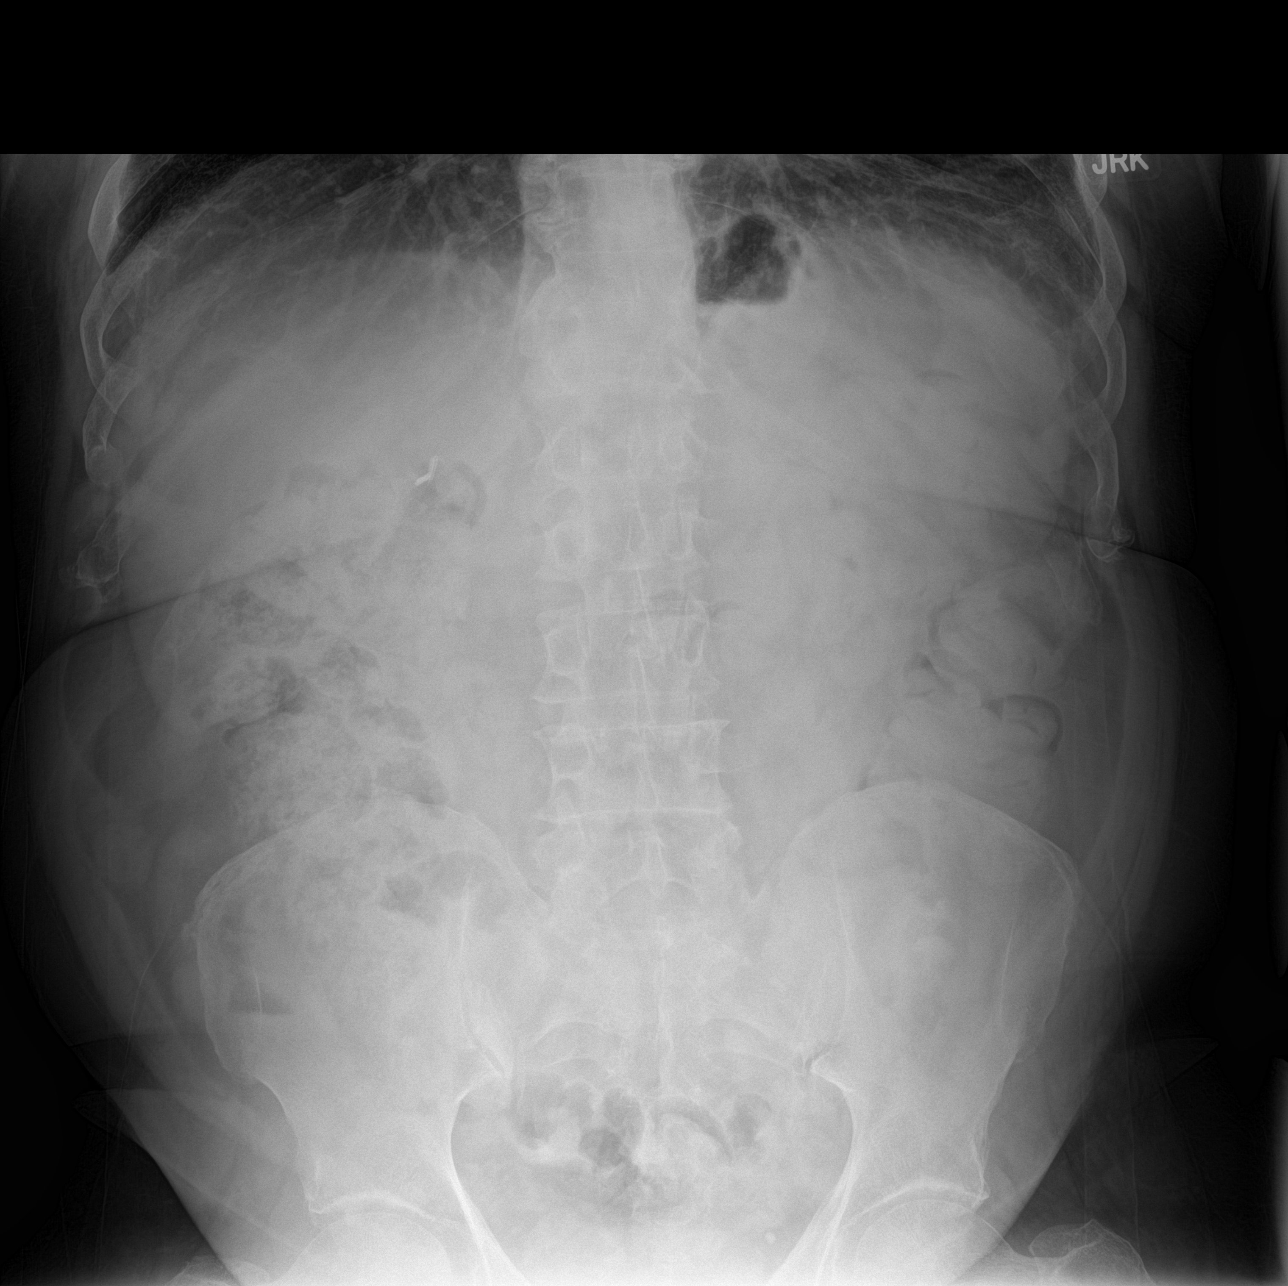

[abdomen supine (1 of 2)]
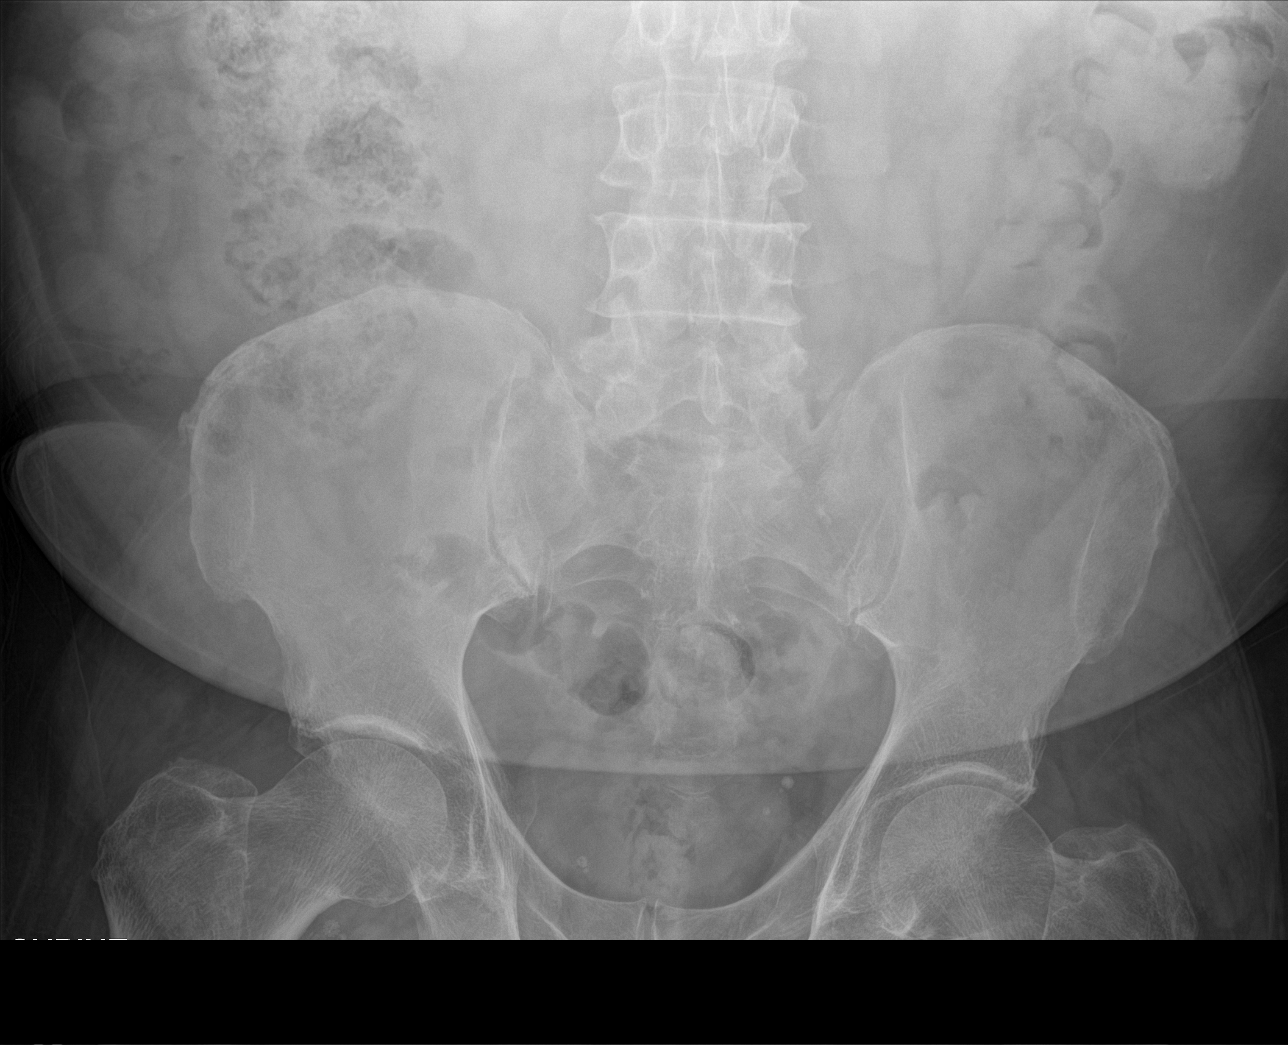

[abdomen supine (2 of 2)]
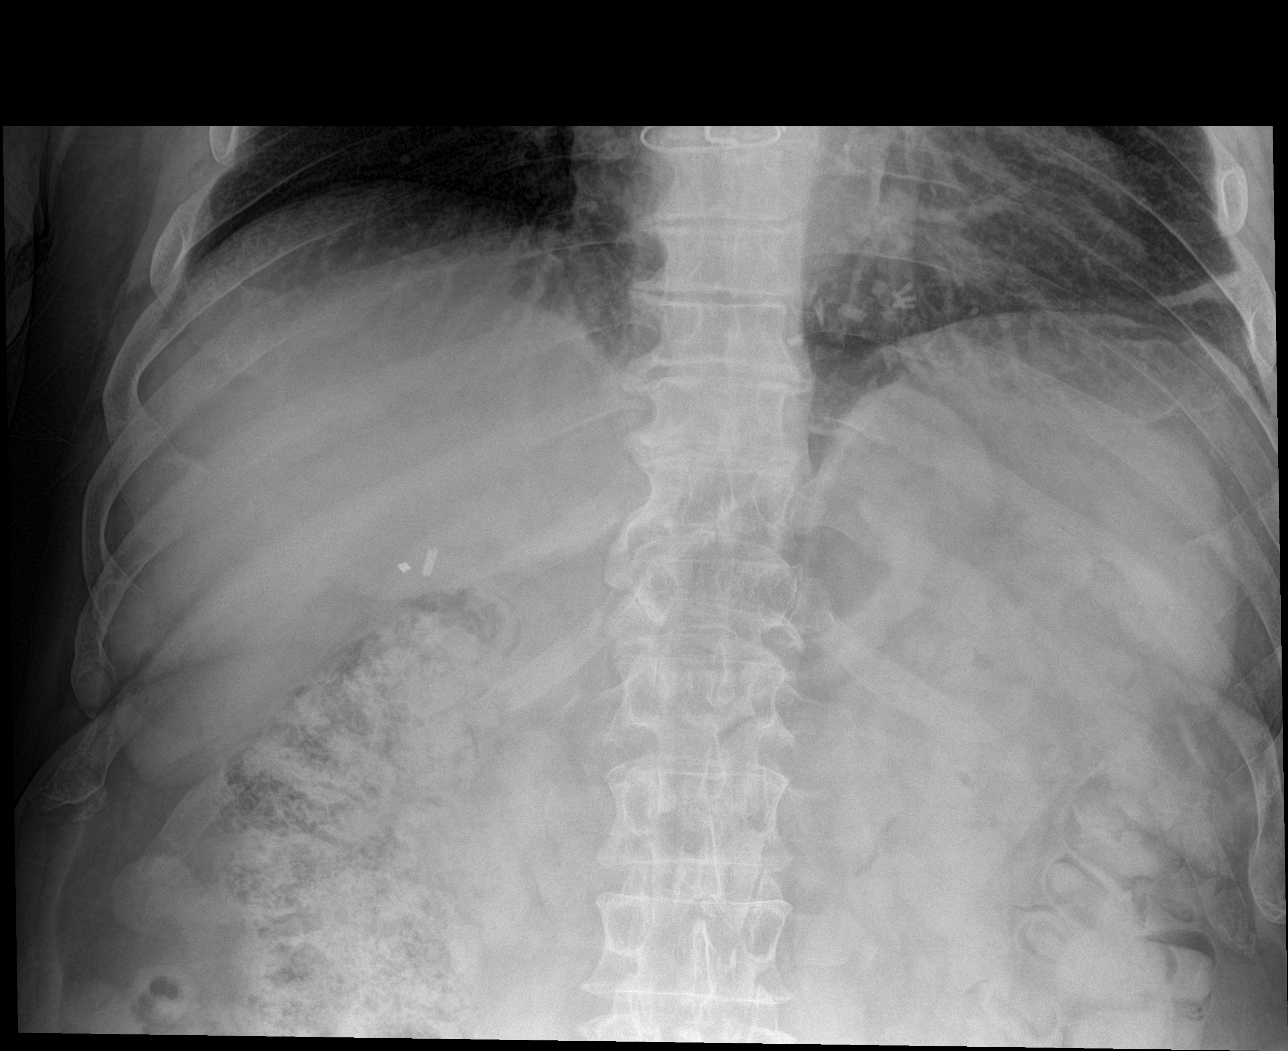

[3 of 3 positions shown; findings below may reference images not displayed]

FINDINGS: Supine and upright images obtained. There is diffuse stool
throughout the colon. There is no bowel dilatation or air-fluid
level suggesting obstruction. No free air. There are phleboliths in
the pelvis. There are surgical clips in right upper quadrant. There
is atelectatic change in the left base. Lung bases otherwise are
clear.
IMPRESSION: Diffuse stool throughout colon.  No obstruction or free air.

## 2017-03-16 NOTE — Telephone Encounter (Signed)
Returned patient's call regarding 5 pound weight gain. Patient says that he's gained 5 pounds in the last 2 days but "it's not fluid". He says that his swelling is stable since decreasing his torsemide to 5m daily but that he hasn't had a bowel movement in the last 4 days.   Advised patient to call PCP ([name] to discuss what he can take to relieve his constipation. Patient is agreeable to that.

## 2017-03-18 ENCOUNTER — Telehealth: Payer: Self-pay | Admitting: Cardiovascular Disease

## 2017-03-18 DIAGNOSIS — I110 Hypertensive heart disease with heart failure: Secondary | ICD-10-CM | POA: Diagnosis not present

## 2017-03-18 DIAGNOSIS — I5033 Acute on chronic diastolic (congestive) heart failure: Secondary | ICD-10-CM | POA: Diagnosis not present

## 2017-03-18 DIAGNOSIS — J449 Chronic obstructive pulmonary disease, unspecified: Secondary | ICD-10-CM | POA: Diagnosis not present

## 2017-03-18 DIAGNOSIS — E119 Type 2 diabetes mellitus without complications: Secondary | ICD-10-CM | POA: Diagnosis not present

## 2017-03-18 DIAGNOSIS — I2510 Atherosclerotic heart disease of native coronary artery without angina pectoris: Secondary | ICD-10-CM | POA: Diagnosis not present

## 2017-03-18 DIAGNOSIS — I4891 Unspecified atrial fibrillation: Secondary | ICD-10-CM | POA: Diagnosis not present

## 2017-03-18 NOTE — Telephone Encounter (Signed)
Denyse Amass from [place] calling to notify patient Dc from nursing home health with goals met

## 2017-03-20 ENCOUNTER — Other Ambulatory Visit: Payer: Self-pay | Admitting: Family Medicine

## 2017-03-21 IMAGING — CR DG ABDOMEN ACUTE W/ 1V CHEST
1 series · 4 of 4 positions shown · non-contrast
Comparison: Acute abdominal series May 08, 2015

CLINICAL DATA: Continued abdominal pain, constipation. History of
emphysema.

EXAM:
DG ABDOMEN ACUTE W/ 1V CHEST

[Series 1: dg abd acute w/chest · 0.14mm/px · 4 of 4 slices shown]
[im 1/4]
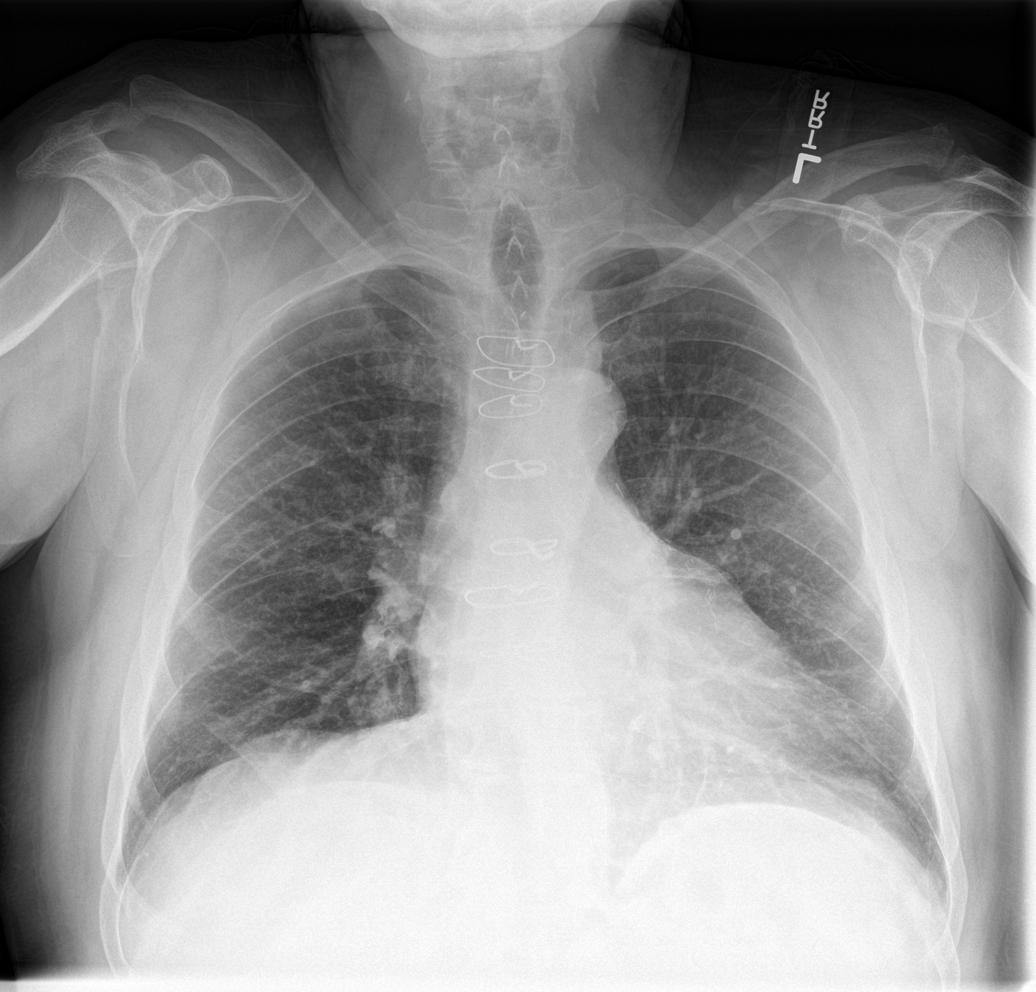
[im 2/4]
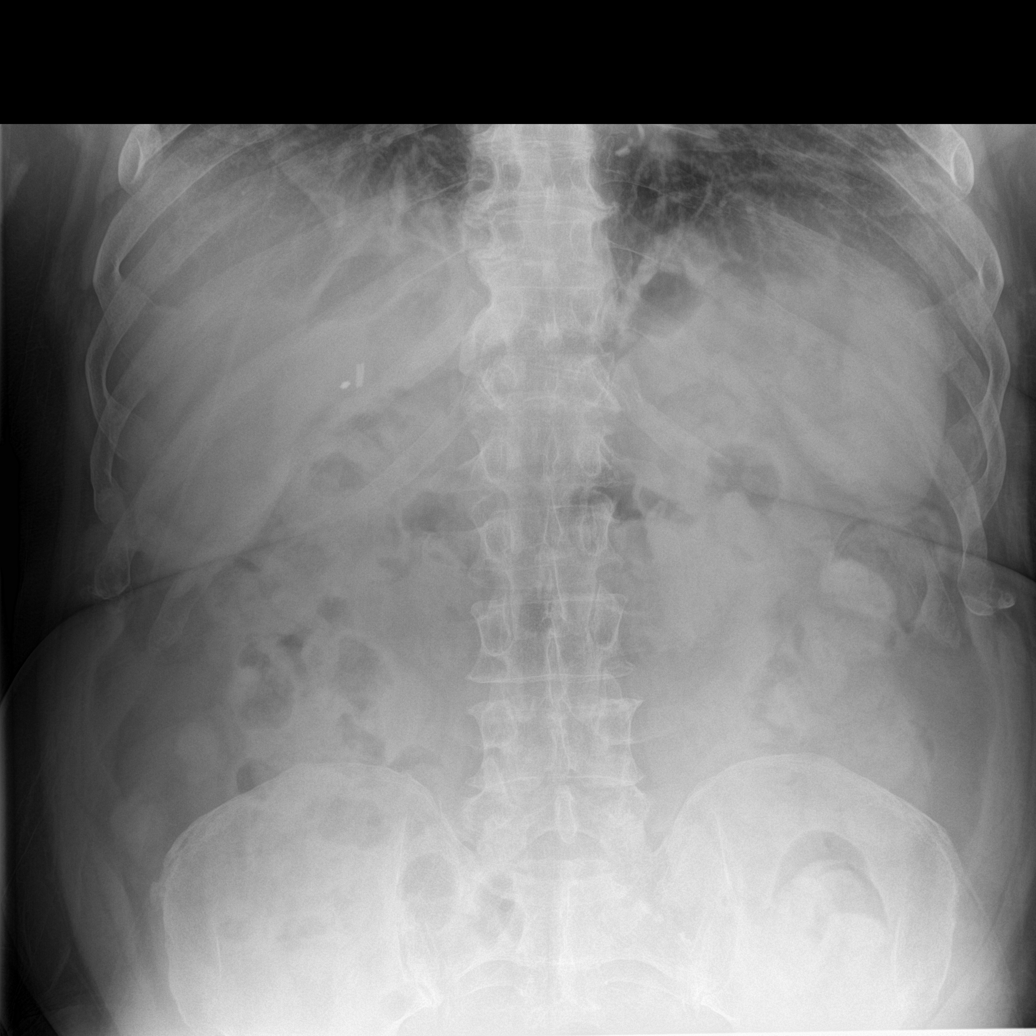
[im 3/4]
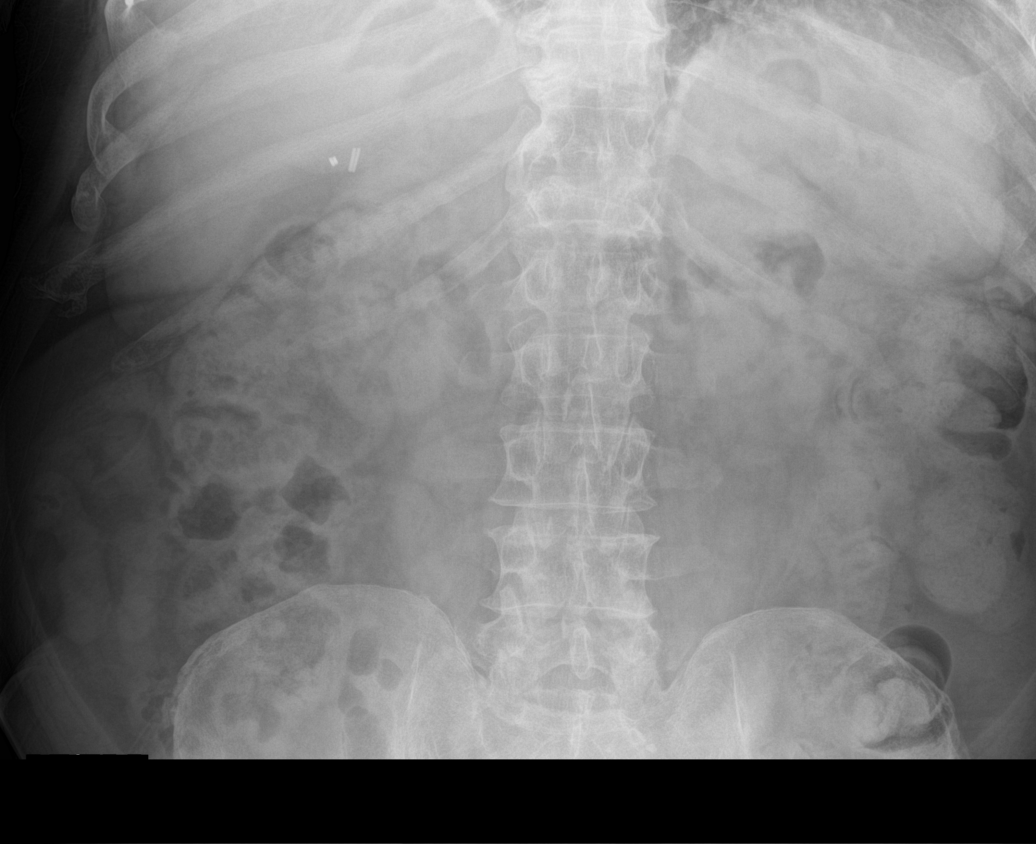
[im 4/4]
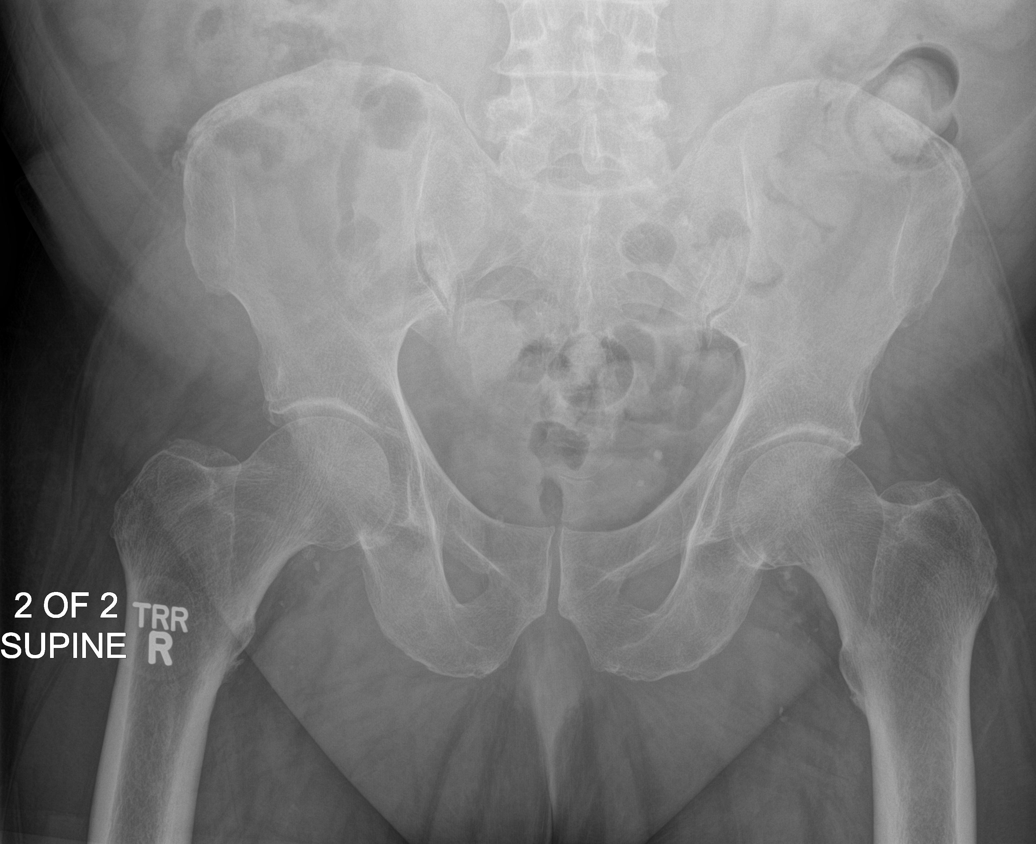

[4 of 4 positions shown; findings below may reference images not displayed]

FINDINGS: Cardiac silhouette is normal in size, status post median sternotomy.
Mild chronic bronchitic changes with bibasilar atelectasis. No
pleural effusion or focal consolidation. No pneumothorax. Symmetric
calcifications in the neck are likely vascular.

Moderate amount of retained large bowel stool, bowel gas pattern is
nondilated and nonobstructive. Surgical clips in the included right
abdomen likely reflect cholecystectomy. No intra-abdominal mass
effect or pathologic calcifications. Mild aortoiliac vascular
calcifications. Soft tissue planes and included osseous structure
nonsuspicious.
IMPRESSION: Mild chronic bronchitic changes without acute cardiopulmonary
process.

Moderate amount of retained large bowel stool, similar without bowel
obstruction.

## 2017-03-22 ENCOUNTER — Other Ambulatory Visit: Payer: Self-pay | Admitting: Family Medicine

## 2017-03-23 ENCOUNTER — Other Ambulatory Visit: Payer: Self-pay | Admitting: Family Medicine

## 2017-03-24 ENCOUNTER — Ambulatory Visit: Payer: Medicare HMO | Attending: Family | Admitting: Family

## 2017-03-24 ENCOUNTER — Telehealth: Payer: Self-pay | Admitting: Family

## 2017-03-24 ENCOUNTER — Telehealth: Payer: Self-pay | Admitting: Family Medicine

## 2017-03-24 ENCOUNTER — Encounter: Payer: Self-pay | Admitting: Family

## 2017-03-24 VITALS — BP 121/66 | HR 63 | Resp 20 | Ht 67.0 in | Wt 242.4 lb

## 2017-03-24 DIAGNOSIS — I4891 Unspecified atrial fibrillation: Secondary | ICD-10-CM | POA: Diagnosis not present

## 2017-03-24 DIAGNOSIS — R109 Unspecified abdominal pain: Secondary | ICD-10-CM | POA: Insufficient documentation

## 2017-03-24 DIAGNOSIS — J449 Chronic obstructive pulmonary disease, unspecified: Secondary | ICD-10-CM | POA: Diagnosis not present

## 2017-03-24 DIAGNOSIS — G4733 Obstructive sleep apnea (adult) (pediatric): Secondary | ICD-10-CM | POA: Diagnosis not present

## 2017-03-24 DIAGNOSIS — Z9981 Dependence on supplemental oxygen: Secondary | ICD-10-CM | POA: Diagnosis not present

## 2017-03-24 DIAGNOSIS — I471 Supraventricular tachycardia: Secondary | ICD-10-CM | POA: Diagnosis not present

## 2017-03-24 DIAGNOSIS — Z7901 Long term (current) use of anticoagulants: Secondary | ICD-10-CM | POA: Diagnosis not present

## 2017-03-24 DIAGNOSIS — D649 Anemia, unspecified: Secondary | ICD-10-CM | POA: Insufficient documentation

## 2017-03-24 DIAGNOSIS — Z87891 Personal history of nicotine dependence: Secondary | ICD-10-CM | POA: Insufficient documentation

## 2017-03-24 DIAGNOSIS — I2510 Atherosclerotic heart disease of native coronary artery without angina pectoris: Secondary | ICD-10-CM | POA: Diagnosis not present

## 2017-03-24 DIAGNOSIS — Z7902 Long term (current) use of antithrombotics/antiplatelets: Secondary | ICD-10-CM | POA: Diagnosis not present

## 2017-03-24 DIAGNOSIS — Z79899 Other long term (current) drug therapy: Secondary | ICD-10-CM | POA: Insufficient documentation

## 2017-03-24 DIAGNOSIS — I110 Hypertensive heart disease with heart failure: Secondary | ICD-10-CM | POA: Diagnosis not present

## 2017-03-24 DIAGNOSIS — F419 Anxiety disorder, unspecified: Secondary | ICD-10-CM | POA: Diagnosis not present

## 2017-03-24 DIAGNOSIS — E119 Type 2 diabetes mellitus without complications: Secondary | ICD-10-CM | POA: Insufficient documentation

## 2017-03-24 DIAGNOSIS — Z951 Presence of aortocoronary bypass graft: Secondary | ICD-10-CM | POA: Insufficient documentation

## 2017-03-24 DIAGNOSIS — Z7984 Long term (current) use of oral hypoglycemic drugs: Secondary | ICD-10-CM | POA: Insufficient documentation

## 2017-03-24 DIAGNOSIS — R10A3 Flank pain, bilateral: Secondary | ICD-10-CM | POA: Insufficient documentation

## 2017-03-24 DIAGNOSIS — I5032 Chronic diastolic (congestive) heart failure: Secondary | ICD-10-CM | POA: Diagnosis not present

## 2017-03-24 LAB — BASIC METABOLIC PANEL
Anion gap: 5 (ref 5–15)
BUN: 11 mg/dL (ref 6–20)
CO2: 29 mmol/L (ref 22–32)
Calcium: 9.1 mg/dL (ref 8.9–10.3)
Chloride: 104 mmol/L (ref 101–111)
Creatinine, Ser: 1.02 mg/dL (ref 0.61–1.24)
GFR calc Af Amer: >60 mL/min (ref >60)
GFR calc non Af Amer: >60 mL/min (ref >60)
Glucose, Bld: 145 mg/dL — ABNORMAL HIGH (ref 65–99)
Potassium: 4.3 mmol/L (ref 3.5–5.1)
Sodium: 138 mmol/L (ref 135–145)

## 2017-03-24 LAB — URINALYSIS, ROUTINE W REFLEX MICROSCOPIC
Bacteria, UA: NONE SEEN
Bilirubin Urine: NEGATIVE
Glucose, UA: >=500 mg/dL — AB
Hgb urine dipstick: NEGATIVE
Ketones, ur: NEGATIVE mg/dL
Leukocytes, UA: NEGATIVE
Nitrite: NEGATIVE
Protein, ur: NEGATIVE mg/dL
Specific Gravity, Urine: 1.025 (ref 1.005–1.030)
Squamous Epithelial / LPF: NONE SEEN
pH: 6 (ref 5.0–8.0)

## 2017-03-24 NOTE — Telephone Encounter (Signed)
Patient was notified. Patient stated that he has been having severe pain in his kidney area since yesterday. Patient wants to know if he should be seen? Patient would also like a to get refill for oxycodone 10-325 mg. Please advise?

## 2017-03-24 NOTE — Progress Notes (Signed)
Patient ID: Lawrence [name], male    DOB: Sep 12, 1956, 61 y.o.   MRN: [id number]  HPI  Mr Devincenzi is a 61 y/o male with a history of DM, PSVT, HTN, COPD (chronic oxygen), CAD with CABG, asthma, anxiety, anemia, atrial fibrillation, obstructive sleep apnea,  past tobacco use and chronic heart failure.   Last echo was done 03/27/16 and showed an EF of 60-65% along with trivial MR/TR. EF has improved from 45-50% November 2016. Most recent cardiac catheterization was done 11/02/16 and a DES was placed in the SVG to OM1 artery.   Was in the ED on 02/05/17 due to abrasions on both arms that wouldn't stop bleeding. Abrasions eventually stopped bleeding and he was discharged home. Was in the ED 11/30/16 due to the flu. Treated and discharged home. Admitted 10/29/16 due to chest pain. Cardiology consult obtained and catheterization done with DES placed. Discharged home after 5 days.   He presents today for his follow-up visit with a chief complaint of fatigue with minimal exertion. He says that his fatigue is improving since torsemide has been decreased. His fatigue has been present for the last few years. He has associated shortness of breath and difficulty sleeping due to not wearing his CPAP. Does wear oxygen at 2L around the clock.   Past Medical History:  Diagnosis Date  . A-fib ([place])   . Anemia   . Anxiety   . Asthma   . CAD (coronary artery disease)    a. 2002 CABGx2 (LIMA->LAD, VG->VG->OM1);  b. 09/2012 DES->OM;  c. 03/2015 PTCA of LAD [facility]) in setting of atretic LIMA; d. 05/2015 Cath [facility]): nonobs dzs; e. 06/2015 Cath ([facility]): LM nl, LAD 45p/d ISR, 50d, D1/2 small, LCX 50p/d ISR, OM1 70ost, 30 ISR, VG->OM1 50ost, 78m LIMA->LAD 99p/d - atretic, RCA dom, nl; f.cath 10/16: 40-50%(FFR 0.90) pLAD, 75% (FFR 0.77) mLAD s/p PCI/DES, oRCA 40% (FFR0.95)  . Celiac disease   . Chronic diastolic CHF (congestive heart failure) ([place]    a. 06/2009 Echo: EF 60-65%, Gr 1 DD, triv AI, mildly dil LA, nl RV.  .Marland KitchenCOPD  (chronic obstructive pulmonary disease) ([place]    a. Chronic bronchitis and emphysema.  .Marland KitchenDysrhythmia   . Essential hypertension   . History of tobacco abuse    a. Quit 2014.  .Marland KitchenPSVT (paroxysmal supraventricular tachycardia) ([place]    a. 10/2012 Noted on Zio Patch.  . Type II diabetes mellitus ([place]    Past Surgical History:  Procedure Laterality Date  . BYPASS GRAFT    . CARDIAC CATHETERIZATION N/A 07/12/2015   rocedure: Left Heart Cath and Cors/Grafts Angiography;  Surgeon: HBelva Crome MD;  [place] [place];  Service: Cardiovascular;  Laterality: N/A;  . CARDIAC CATHETERIZATION Right 10/07/2015   Procedure: Left Heart Cath and Cors/Grafts Angiography;  Surgeon: SDionisio David MD;  [facility];  Service: Cardiovascular;  Laterality: Right;  . CARDIAC CATHETERIZATION N/A 04/06/2016   Procedure: Left Heart Cath and Coronary Angiography;  Surgeon: DYolonda Kida MD;  [place];  Service: Cardiovascular;  Laterality: N/A;  . CARDIAC CATHETERIZATION  04/06/2016   Procedure: Bypass Graft Angiography;  Surgeon: DYolonda Kida MD;  [place] [facility];  Service: Cardiovascular;;  . CARDIAC CATHETERIZATION N/A 11/02/2016   Procedure: Left Heart Cath and Cors/Grafts Angiography and possible PCI;  Surgeon: DYolonda Kida MD;  [facility];  Service: Cardiovascular;  Laterality: N/A;  . CARDIAC  CATHETERIZATION N/A 11/02/2016   Procedure: Coronary Stent Intervention;  Surgeon: Yolonda Kida, MD;  Location: [place] [facility];  Service: Cardiovascular;  Laterality: N/A;  . CHOLECYSTECTOMY    . ESOPHAGEAL DILATION    . TONSILLECTOMY    . VASCULAR SURGERY     Family History  Problem Relation Age of Onset  . Heart attack Mother   . Depression Mother   . Heart disease Mother   . COPD Mother   . Hypertension Mother   . Heart attack Father   . Diabetes Father   . Depression Father   . Heart disease  Father   . Cirrhosis Father   . Parkinson's disease Brother    Social History  Substance Use Topics  . Smoking status: Former Smoker    Quit date: 04/22/2013  . Smokeless tobacco: Never Used  . Alcohol use No     Comment: remotely quit alcohol use. Hx of heavy alcohol use.   Allergies  Allergen Reactions  . Demerol [Meperidine] Hives  . Prednisone Other (See Comments) and Hypertension    Pt states that this medication puts him in A-fib   . Sulfa Antibiotics Hives  . Albuterol Sulfate [Albuterol] Palpitations and Other (See Comments)    Pt currently uses this medication.    . Morphine Sulfate Nausea And Vomiting, Rash and Other (See Comments)    Pt states that he is only allergic to the tablet form of this medication.     Prior to Admission medications   Medication Sig Start Date End Date Taking? Authorizing Provider  albuterol (PROVENTIL HFA;VENTOLIN HFA) 108 (90 Base) MCG/ACT inhaler Inhale 4-6 puffs into the lungs every 4 (four) hours as needed for wheezing or shortness of breath. Reported on 04/08/2016   Yes [provider]  allopurinol (ZYLOPRIM) 100 MG tablet TAKE 3 TABLETS BY MOUTH EVERY DAY. 03/22/17  Yes Birdie Sons, MD  ALPRAZolam Duanne Moron) 1 MG tablet TAKE ONE-HALF TO ONE TABLET BY MOUTH THREE TIMES DAILY AS NEEDED FOR ANXIETY 01/11/17  Yes Birdie Sons, MD  apixaban (ELIQUIS) 5 MG TABS tablet Take 5 mg by mouth 2 (two) times daily.   Yes [provider]  atorvastatin (LIPITOR) 80 MG tablet TAKE 1 TABLET BY MOUTH EVERY NIGHT AT BEDTIME. 05/26/16  Yes Birdie Sons, MD  cetirizine (ZYRTEC) 10 MG tablet TAKE ONE TABLET AT BEDTIME 04/19/16  Yes Birdie Sons, MD  clopidogrel (PLAVIX) 75 MG tablet TAKE 1 TABLET BY MOUTH DAILY WITH BREAKFAST 01/25/17  Yes Birdie Sons, MD  cyclobenzaprine (FLEXERIL) 10 MG tablet TAKE 1 TABLET BY MOUTH EVERY 8 HOURS AS NEEDED FOR MUSCLE SPASM 03/20/17  Yes Birdie Sons, MD  docusate sodium (COLACE) 100 MG capsule Take  100 mg by mouth 2 (two) times daily.   Yes [provider]  empagliflozin (JARDIANCE) 10 MG TABS tablet Take 10 mg by mouth daily. 03/05/17  Yes Birdie Sons, MD  fluticasone furoate-vilanterol (BREO ELLIPTA) 100-25 MCG/INH AEPB Inhale 1 puff into the lungs daily.    Yes [provider]  glucose blood (ACCU-CHEK AVIVA PLUS) test strip Use to check blood sugar once a day 11/26/16  Yes [name], [name], MD  isosorbide mononitrate (IMDUR) 60 MG 24 hr tablet Take 1 tablet (60 mg total) by mouth daily. 11/04/16  Yes Henreitta Leber, MD  lansoprazole (PREVACID) 30 MG capsule Take 30 mg by mouth 2 (two) times daily.    Yes [provider]  lisinopril (  PRINIVIL,ZESTRIL) 2.5 MG tablet Take 1 tablet (2.5 mg total) by mouth daily. 02/19/17  Yes Darylene Price A, FNP  Magnesium Oxide 400 (240 Mg) MG TABS TAKE ONE TABLET BY MOUTH DAILY 03/23/17  Yes Birdie Sons, MD  metFORMIN (GLUCOPHAGE) 500 MG tablet TAKE 1 TABLET BY MOUTH EVERY MORNING. 10/05/16  Yes Birdie Sons, MD  metolazone (ZAROXOLYN) 5 MG tablet Take 1 tablet (5 mg total) by mouth daily. 03/05/17 06/03/17 Yes Darylene Price A, FNP  metoprolol tartrate (LOPRESSOR) 25 MG tablet Take 0.5 tablets (12.5 mg total) by mouth 2 (two) times daily. 06/26/16  Yes Epifanio Lesches, MD  nitroGLYCERIN (NITROSTAT) 0.4 MG SL tablet Place 1 tablet (0.4 mg total) under the tongue every 5 (five) minutes as needed for chest pain. Reported on 05/26/2016 10/06/16  Yes Birdie Sons, MD  omega-3 acid ethyl esters (LOVAZA) 1 g capsule TAKE FOUR CAPSULES BY MOUTH DAILY 07/25/16  Yes Birdie Sons, MD  oxyCODONE-acetaminophen (PERCOCET) 10-325 MG tablet Take 1 tablet by mouth every 4 (four) hours as needed. 02/26/17  Yes Birdie Sons, MD  potassium chloride SA (K-DUR,KLOR-CON) 20 MEQ tablet Take 1 tablet (20 mEq total) by mouth daily. And an additional 48mq in the afternoon when taking the metolazone 03/05/17 06/03/17 Yes Hackney, TAura Fey FNP   ranolazine (RANEXA) 1000 MG SR tablet Take 1 tablet (1,000 mg total) by mouth 2 (two) times daily. 11/03/16  Yes SHenreitta Leber MD  sotalol (BETAPACE) 120 MG tablet Take 120 mg by mouth 2 (two) times daily.    Yes [provider]  SPIRIVA HANDIHALER 18 MCG inhalation capsule INHALE 1 CAPSULE VIA INHALER ONCE A DAY 01/25/17  Yes FBirdie Sons MD  tamsulosin (FLOMAX) 0.4 MG CAPS capsule Take 0.4 mg by mouth daily after breakfast.    Yes [provider]  torsemide (DEMADEX) 100 MG tablet Take 50 mg by mouth daily.    Yes [provider]    Review of Systems  Constitutional: Positive for fatigue (better). Negative for appetite change.  HENT: Negative for congestion, rhinorrhea and sore throat.   Eyes: Negative.   Respiratory: Positive for shortness of breath. Negative for cough, chest tightness and wheezing.   Cardiovascular: Negative for chest pain, palpitations and leg swelling.  Gastrointestinal: Positive for abdominal pain (tenderness above umbilicus at times; improving). Negative for abdominal distention, constipation and nausea.  Endocrine: Negative.   Genitourinary: Negative.   Musculoskeletal: Positive for back pain (flank pain). Negative for neck pain.  Skin: Negative.   Allergic/Immunologic: Negative.   Neurological: Negative for weakness and light-headedness.  Hematological: Negative for adenopathy. Bruises/bleeds easily.  Psychiatric/Behavioral: Positive for sleep disturbance (wearing oxygen at 2L; not wearing CPAP due to space issues). Negative for dysphoric mood and suicidal ideas. The patient is not nervous/anxious.    Vitals:   03/24/17 0912  BP: 121/66  Pulse: 63  Resp: 20  SpO2: 100%  Weight: 242 lb 6 oz (109.9 kg)  Height: 5' 7"  (1.702 m)   Wt Readings from Last 3 Encounters:  03/24/17 242 lb 6 oz (109.9 kg)  03/10/17 232 lb 8 oz (105.5 kg)  03/04/17 236 lb 4 oz (107.2 kg)    Lab Results  Component Value Date   CREATININE 2.00  (H) 03/10/2017   CREATININE 1.39 (H) 03/04/2017   CREATININE 0.99 02/19/2017    Physical Exam  Constitutional: He is oriented to person, place, and time. He appears well-developed and well-nourished.  HENT:  Head: Normocephalic and  atraumatic.  Neck: Normal range of motion. Neck supple. No JVD present.  Cardiovascular: Normal rate and regular rhythm.   Pulmonary/Chest: Effort normal. He has no wheezes. He has no rales.  Abdominal: Soft. He exhibits no distension. There is tenderness (above umbilicus).  Musculoskeletal: He exhibits no edema or tenderness.  Neurological: He is alert and oriented to person, place, and time.  Skin: Skin is warm and dry.  Psychiatric: He has a normal mood and affect. His behavior is normal. Thought content normal.  Nursing note and vitals reviewed.   Assessment & Plan:  1: Chronic heart failure with preserved ejection fraction- - NYHA class III - euvolemic - weighing daily. Instructed to call for an overnight weight gain of >2 pounds or a weekly weight gain of >5 pounds. Weight has gone up 10.2 pounds since last here 03/10/17. Torsemide has been decreased and patient says that he's had difficulty with constipation recently. Abdomen soft and no pedal edema present.  - not adding salt and is currently rinsing canned foods. Reviewed the importance of closely following a 2056m sodium diet as he's eating quite a bit of microwave meals since he's still living in a motel - still hasn't measured how much he's drinking. Says that he's drinking 3 large cups of water daily but, again, can't quantify how much the cup holds. Needs to keep fluid intake to 40-50 ounces daily - BMP drawn 03/10/17 showed potassium 3.6 but GFR had decreased to 34. Patient instructed at that time to decrease daily torsemide use to 530mdaily (down from 10016mand to monitor edema/weight closely - will check a BMP today  - saw cardiologist (KoNehemiah Massed/2/18 and returns to him 03/25/17 - [facility]. Social worker involved to try and help with his housing arrangements.   2: Bilateral flank pain- - he says that he's been having flank pain that started yesterday; describes it as stabbing in nature - denies burning, pain or blood in his urine - will get a urinalysis today - follow-up with PCP regarding this; will fax result to PCP once we get them back.   3: Obstructive sleep apnea- - wears oxygen at 2L around the clock - has CPAP equipment but it's currently in storage as he's been living in a hotel with family for the last year and doesn't have any space to place the machine - discussed the relationship between untreated sleep apnea and heart failure and strongly encouraged him to find a place to put the equipment so that he can resume wearing his CPAP  Patient did not bring his medications nor a list. Each medication was verbally reviewed with the patient and he was encouraged to bring the bottles to every visit to confirm accuracy of list.  Return here in 2 months or sooner for any questions/problems before then.

## 2017-03-24 NOTE — Telephone Encounter (Signed)
Please advise? Patient is requesting Dr. [name] opinion concerning labs that were drawn today. (glucose level)

## 2017-03-24 NOTE — Telephone Encounter (Signed)
Labs are good. Blood glucose slightly high and some glucose showed up in urine which is not unusual for a diabetic.

## 2017-03-24 NOTE — Telephone Encounter (Signed)
Pt called and wants a nurse to call him back about the cardio visit he had today with [facility].  He is wanting Dr. Sabino Snipes opinion on The findings today at the [place].  About his glucose levels and the change in his medication for diabetes.  He is having kidney pain and they are wondering if it is from the [place].  Pt's call back is [phone]  Thanks, Con Memos

## 2017-03-24 NOTE — Patient Instructions (Signed)
Continue weighing daily and call for an overnight weight gain of > 2 pounds or a weekly weight gain of >5 pounds. 

## 2017-03-24 NOTE — Telephone Encounter (Signed)
Spoke with patient about lab results and urinalysis that were done today (03/24/17). Potassium level is 4.3 and renal function has improved since torsemide decreased to 33m daily.  No infection/blood in urine but glucose >500. Patient says that his morning fasting glucose readings have been around 171. Advised patient that glucose in the urine is not normal and could be contributing to the flank pain that he's experiencing.   Advised him that I would fax results to his PCP ([name] but that he should call his office as well for further instruction regarding glucose in the urine.

## 2017-03-25 DIAGNOSIS — R001 Bradycardia, unspecified: Secondary | ICD-10-CM | POA: Diagnosis not present

## 2017-03-25 DIAGNOSIS — I214 Non-ST elevation (NSTEMI) myocardial infarction: Secondary | ICD-10-CM | POA: Diagnosis not present

## 2017-03-25 DIAGNOSIS — E119 Type 2 diabetes mellitus without complications: Secondary | ICD-10-CM | POA: Diagnosis not present

## 2017-03-25 DIAGNOSIS — I480 Paroxysmal atrial fibrillation: Secondary | ICD-10-CM | POA: Diagnosis not present

## 2017-03-25 DIAGNOSIS — I2510 Atherosclerotic heart disease of native coronary artery without angina pectoris: Secondary | ICD-10-CM | POA: Diagnosis not present

## 2017-03-25 DIAGNOSIS — E782 Mixed hyperlipidemia: Secondary | ICD-10-CM | POA: Diagnosis not present

## 2017-03-25 DIAGNOSIS — I10 Essential (primary) hypertension: Secondary | ICD-10-CM | POA: Diagnosis not present

## 2017-03-25 DIAGNOSIS — R0602 Shortness of breath: Secondary | ICD-10-CM | POA: Diagnosis not present

## 2017-03-25 MED ORDER — OXYCODONE-ACETAMINOPHEN 10-325 MG PO TABS: 1.0000 | ORAL_TABLET | ORAL | 0 refills | Status: DC | PRN

## 2017-03-25 NOTE — Telephone Encounter (Signed)
Patient was notified rx is ready. Patient scheduled appt for 03/26/2017.

## 2017-03-25 NOTE — Telephone Encounter (Signed)
Prescription is ready. If he is still having pain in his kidney area then he should be seen.

## 2017-03-26 ENCOUNTER — Ambulatory Visit (INDEPENDENT_AMBULATORY_CARE_PROVIDER_SITE_OTHER): Payer: Medicare HMO | Admitting: Family Medicine

## 2017-03-26 VITALS — BP 132/72 | HR 53 | Temp 97.6°F | Resp 16 | Wt 242.0 lb

## 2017-03-26 DIAGNOSIS — M549 Dorsalgia, unspecified: Secondary | ICD-10-CM

## 2017-03-26 LAB — POCT URINALYSIS DIPSTICK
Bilirubin, UA: NEGATIVE
Blood, UA: NEGATIVE
Glucose, UA: 2000
Ketones, UA: NEGATIVE
Nitrite, UA: NEGATIVE
Protein, UA: NEGATIVE
Spec Grav, UA: 1.02 (ref 1.010–1.025)
Urobilinogen, UA: 0.2 E.U./dL
pH, UA: 6 (ref 5.0–8.0)

## 2017-03-26 MED ORDER — CYCLOBENZAPRINE HCL 5 MG PO TABS: 5.0000 mg | ORAL_TABLET | Freq: Three times a day (TID) | ORAL | 2 refills | Status: DC | PRN

## 2017-03-26 NOTE — Progress Notes (Signed)
Patient: Lawrence [name] Male    DOB: May 02, 1956   61 y.o.   MRN: [id number] Visit Date: 03/26/2017  Today's Provider: [name], MD   Chief Complaint  Patient presents with  . Back Pain    x 3 days   Subjective:    Back Pain  This is a new problem. Episode onset: 3 days ago. The problem occurs constantly. The problem is unchanged. Pain location: mid back. Quality: squeezing pain. The pain does not radiate. Pertinent negatives include no abdominal pain, chest pain or fever. He has tried nothing for the symptoms.  Patient states his urine has a foul odor and is darker than usual. Patient states his back pain is near his kidneys.He recalls no injury. Has not taken any medications for it. We did sent refill for cyclobenzaprine pharmacy on 03-20-2017, but he has not picked it up yet.      Allergies  Allergen Reactions  . Demerol [Meperidine] Hives  . Prednisone Other (See Comments) and Hypertension    Pt states that this medication puts him in A-fib   . Sulfa Antibiotics Hives  . Albuterol Sulfate [Albuterol] Palpitations and Other (See Comments)    Pt currently uses this medication.    . Morphine Sulfate Nausea And Vomiting, Rash and Other (See Comments)    Pt states that he is only allergic to the tablet form of this medication.       Current Outpatient Prescriptions:  .  albuterol (PROVENTIL HFA;VENTOLIN HFA) 108 (90 Base) MCG/ACT inhaler, Inhale 4-6 puffs into the lungs every 4 (four) hours as needed for wheezing or shortness of breath. Reported on 04/08/2016, Disp: , Rfl:  .  allopurinol (ZYLOPRIM) 100 MG tablet, TAKE 3 TABLETS BY MOUTH EVERY DAY., Disp: 90 tablet, Rfl: 4 .  ALPRAZolam (XANAX) 1 MG tablet, TAKE ONE-HALF TO ONE TABLET BY MOUTH THREE TIMES DAILY AS NEEDED FOR ANXIETY, Disp: 90 tablet, Rfl: 3 .  apixaban (ELIQUIS) 5 MG TABS tablet, Take 5 mg by mouth 2 (two) times daily., Disp: , Rfl:  .  atorvastatin (LIPITOR) 80 MG tablet, TAKE 1 TABLET BY MOUTH EVERY  NIGHT AT BEDTIME., Disp: 90 tablet, Rfl: 4 .  cetirizine (ZYRTEC) 10 MG tablet, TAKE ONE TABLET AT BEDTIME, Disp: 30 tablet, Rfl: 12 .  clopidogrel (PLAVIX) 75 MG tablet, TAKE 1 TABLET BY MOUTH DAILY WITH BREAKFAST, Disp: 30 tablet, Rfl: 12 .  cyclobenzaprine (FLEXERIL) 10 MG tablet, TAKE 1 TABLET BY MOUTH EVERY 8 HOURS AS NEEDED FOR MUSCLE SPASM, Disp: 30 tablet, Rfl: 5 .  docusate sodium (COLACE) 100 MG capsule, Take 100 mg by mouth 2 (two) times daily., Disp: , Rfl:  .  empagliflozin (JARDIANCE) 10 MG TABS tablet, Take 10 mg by mouth daily., Disp: 30 tablet, Rfl: 3 .  fluticasone furoate-vilanterol (BREO ELLIPTA) 100-25 MCG/INH AEPB, Inhale 1 puff into the lungs daily. , Disp: , Rfl:  .  glucose blood (ACCU-CHEK AVIVA PLUS) test strip, Use to check blood sugar once a day, Disp: 100 each, Rfl: 4 .  isosorbide mononitrate (IMDUR) 60 MG 24 hr tablet, Take 1 tablet (60 mg total) by mouth daily. (Patient taking differently: Take 60 mg by mouth 2 (two) times daily. ), Disp: 60 tablet, Rfl: 1 .  lansoprazole (PREVACID) 30 MG capsule, Take 30 mg by mouth 2 (two) times daily. , Disp: , Rfl:  .  lisinopril (PRINIVIL,ZESTRIL) 2.5 MG tablet, Take 1 tablet (2.5 mg total) by mouth daily., Disp: 30  tablet, Rfl: 5 .  Magnesium Oxide 400 (240 Mg) MG TABS, TAKE ONE TABLET BY MOUTH DAILY, Disp: 30 tablet, Rfl: 12 .  metFORMIN (GLUCOPHAGE) 500 MG tablet, TAKE 1 TABLET BY MOUTH EVERY MORNING., Disp: 90 tablet, Rfl: 4 .  metolazone (ZAROXOLYN) 5 MG tablet, Take 1 tablet (5 mg total) by mouth daily., Disp: 3 tablet, Rfl: 0 .  nitroGLYCERIN (NITROSTAT) 0.4 MG SL tablet, Place 1 tablet (0.4 mg total) under the tongue every 5 (five) minutes as needed for chest pain. Reported on 05/26/2016, Disp: 30 tablet, Rfl: 1 .  omega-3 acid ethyl esters (LOVAZA) 1 g capsule, TAKE FOUR CAPSULES BY MOUTH DAILY, Disp: 120 capsule, Rfl: 6 .  oxyCODONE-acetaminophen (PERCOCET) 10-325 MG tablet, Take 1 tablet by mouth every 4 (four) hours as  needed., Disp: 150 tablet, Rfl: 0 .  potassium chloride SA (K-DUR,KLOR-CON) 20 MEQ tablet, Take 1 tablet (20 mEq total) by mouth daily. And an additional 53mq in the afternoon when taking the metolazone, Disp: 45 tablet, Rfl: 5 .  ranolazine (RANEXA) 1000 MG SR tablet, Take 1 tablet (1,000 mg total) by mouth 2 (two) times daily., Disp: 60 tablet, Rfl: 1 .  sotalol (BETAPACE) 120 MG tablet, Take 120 mg by mouth 2 (two) times daily. , Disp: , Rfl:  .  SPIRIVA HANDIHALER 18 MCG inhalation capsule, INHALE 1 CAPSULE VIA INHALER ONCE A DAY, Disp: 30 capsule, Rfl: 12 .  tamsulosin (FLOMAX) 0.4 MG CAPS capsule, Take 0.4 mg by mouth daily after breakfast. , Disp: , Rfl:  .  torsemide (DEMADEX) 100 MG tablet, Take 50 mg by mouth daily. , Disp: , Rfl:   Review of Systems  Constitutional: Negative for appetite change, chills and fever.  Respiratory: Negative for chest tightness, shortness of breath and wheezing.   Cardiovascular: Negative for chest pain and palpitations.  Gastrointestinal: Negative for abdominal pain, nausea and vomiting.  Musculoskeletal: Positive for back pain.    Social History  Substance Use Topics  . Smoking status: Former Smoker    Quit date: 04/22/2013  . Smokeless tobacco: Never Used  . Alcohol use No     Comment: remotely quit alcohol use. Hx of heavy alcohol use.   Objective:   BP 132/72 (BP Location: Left Arm, Patient Position: Sitting, Cuff Size: Large)   Pulse (!) 53   Temp 97.6 F (36.4 C) (Oral)   Resp 16   Wt 242 lb (109.8 kg)   SpO2 99% Comment: 2L 02 nasal canula  BMI 37.90 kg/m  There were no vitals filed for this visit.   Physical Exam  .General appearance: alert, well developed, well nourished, cooperative and in no distress Head: Normocephalic, without obvious abnormality, atraumatic Respiratory: Respirations even and unlabored, normal respiratory rate Extremities: No gross deformities. Tender bilateral parathoracic muscles.  Skin: Skin color,  texture, turgor normal. No rashes seen  Psych: Appropriate mood and affect. Neurologic: Mental status: Alert, oriented to person, place, and time, thought content appropriate.  Results for orders placed or performed in visit on 03/26/17  POCT Urinalysis Dipstick  Result Value Ref Range   Color, UA amber    Clarity, UA clear    Glucose, UA 2,000 (2 or more)    Bilirubin, UA Negative    Ketones, UA Negative    Spec Grav, UA 1.020 1.010 - 1.025   Blood, UA Negative    pH, UA 6.0 5.0 - 8.0   Protein, UA Negative    Urobilinogen, UA 0.2 0.2 or 1.0 E.U./dL  Nitrite, UA Negative    Leukocytes, UA Trace (A) Negative       Assessment & Plan:     1. Other acute back pain  - POCT Urinalysis Dipstick - cyclobenzaprine (FLEXERIL) 5 MG tablet; Take 1-2 tablets (5-10 mg total) by mouth 3 (three) times daily as needed (back pain).  Dispense: 30 tablet; Refill: 2  Call if symptoms change or if not rapidly improving.          [name], MD  [facility]

## 2017-03-28 IMAGING — CT CT ABD-PELV W/ CM
1 of 3 series · 14 of 32 positions shown, 19 images · IV contrast (omnipaque)
Comparison: CT 03/07/2015.

CLINICAL DATA: generalized abd pain and nausea diffuse abdominal
pain and constipation. Celiac disease. Pancreatitis.

EXAM:
CT ABDOMEN AND PELVIS WITH CONTRAST
TECHNIQUE: Multidetector CT imaging of the abdomen and pelvis was performed
using the standard protocol following bolus administration of
intravenous contrast.
CONTRAST:  100mL OMNIPAQUE IOHEXOL 300 MG/ML  SOLN

[Series 2: routine abd pel with · axial · 0.92mm/px · z∈[-542,-122]mm · 14 of 96 slices shown, 19 images]
[im 6/96  soft-tissue]
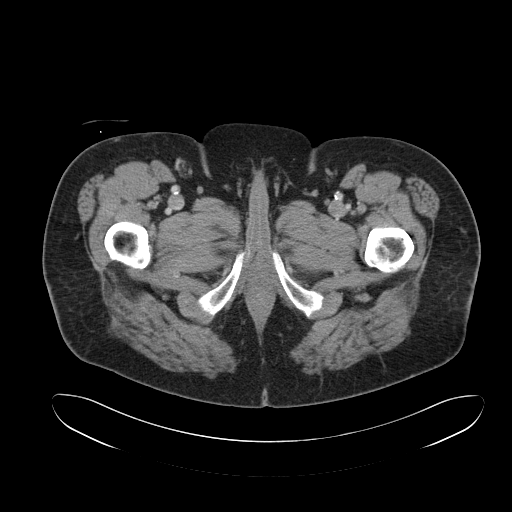
[im 6/96  bone]
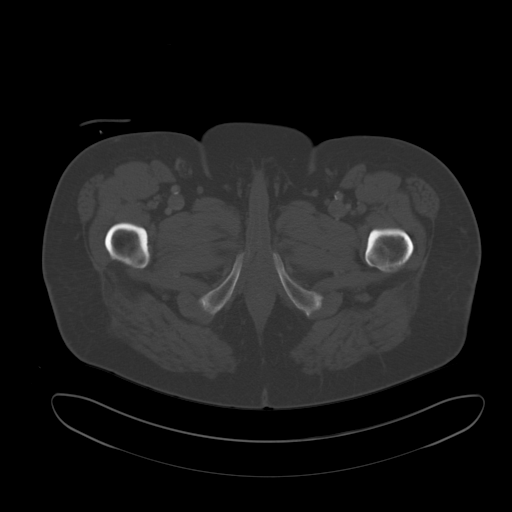
[im 11/96  soft-tissue]
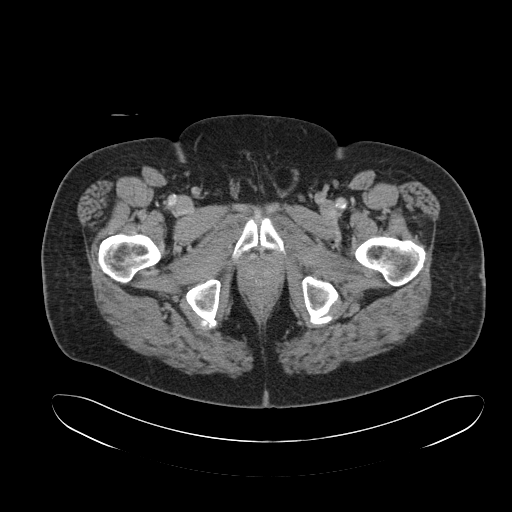
[im 22/96  soft-tissue]
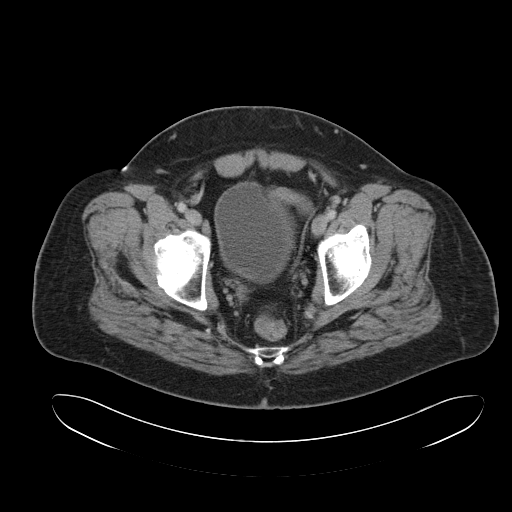
[im 27/96  soft-tissue]
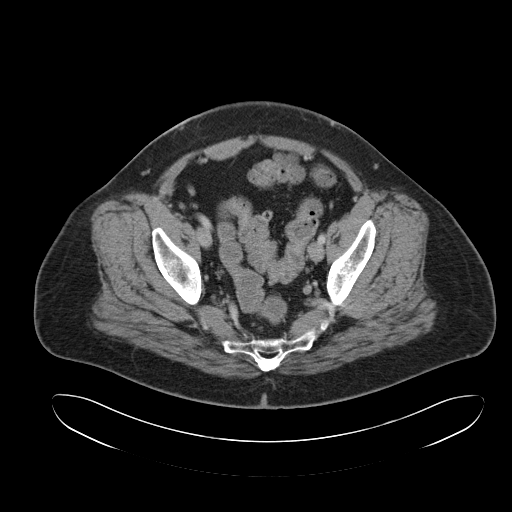
[im 32/96  soft-tissue]
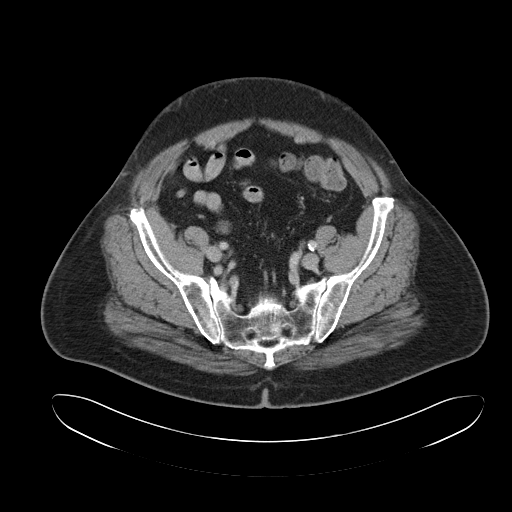
[im 43/96  soft-tissue]
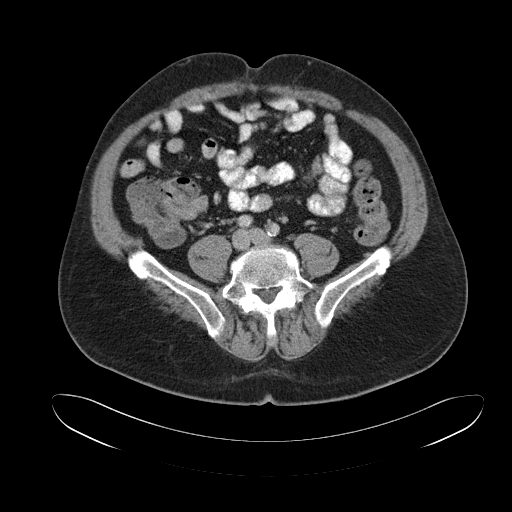
[im 48/96  soft-tissue]
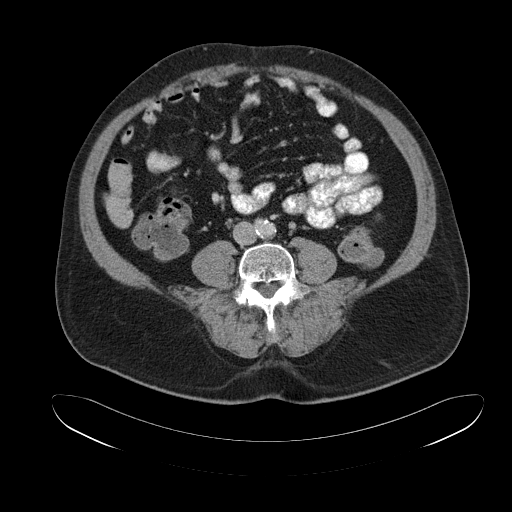
[im 53/96  soft-tissue]
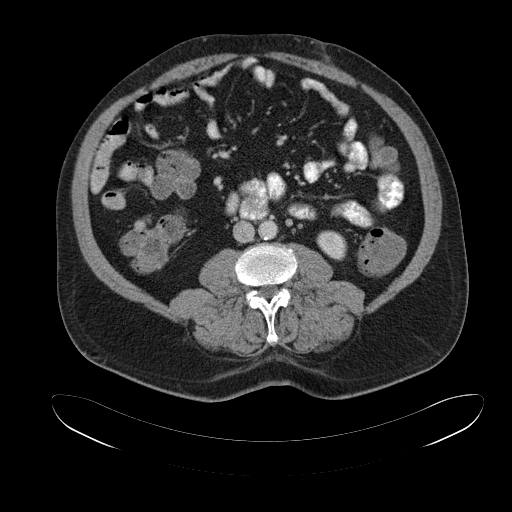
[im 64/96  soft-tissue]
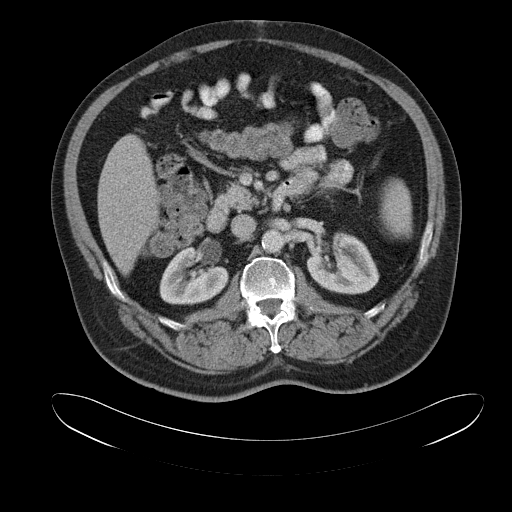
[im 64/96  bone]
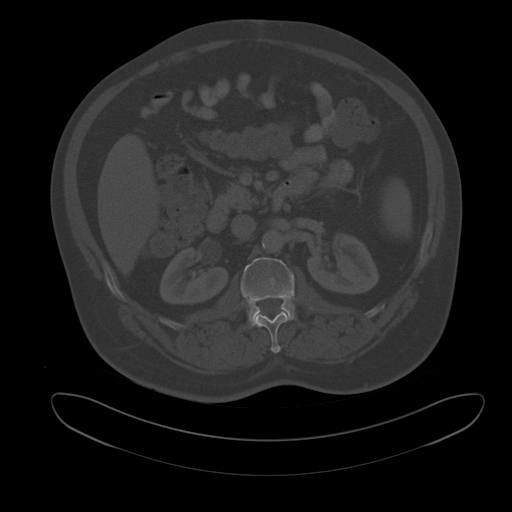
[im 69/96  soft-tissue]
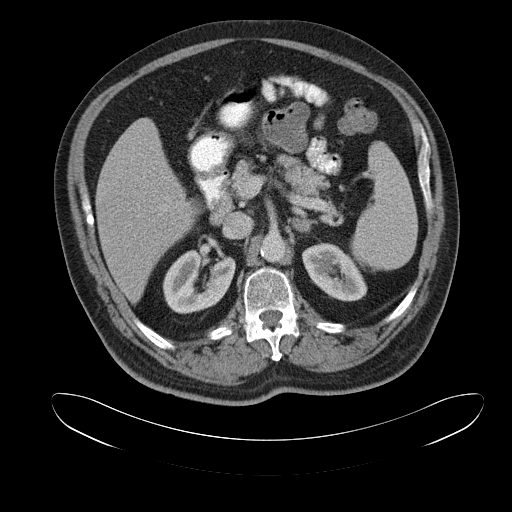
[im 74/96  soft-tissue]
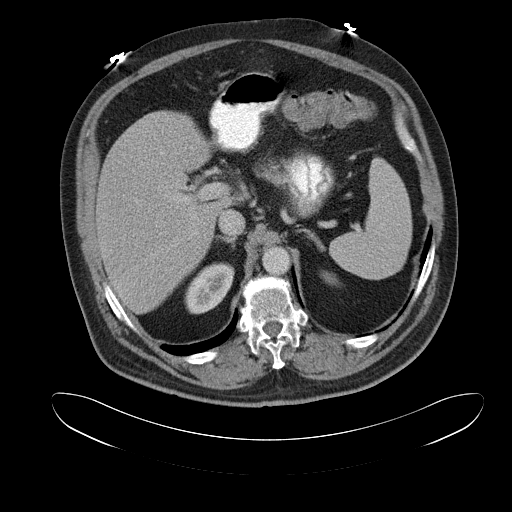
[im 74/96  lung]
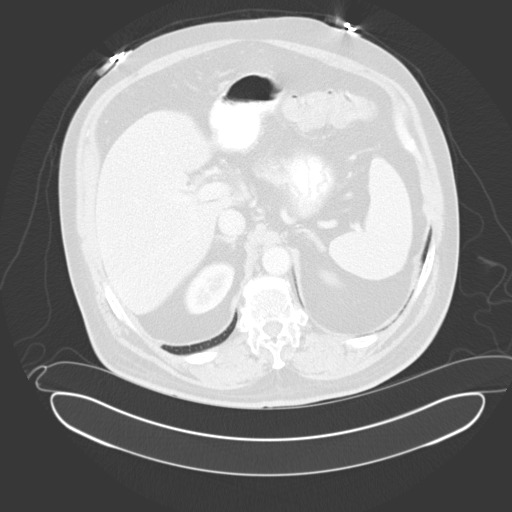
[im 80/96  lung]
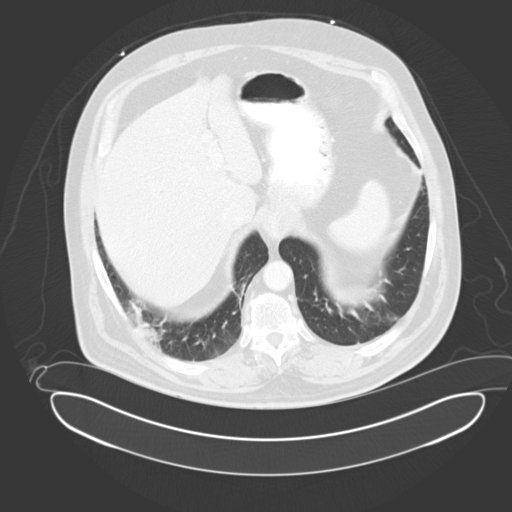
[im 85/96  soft-tissue]
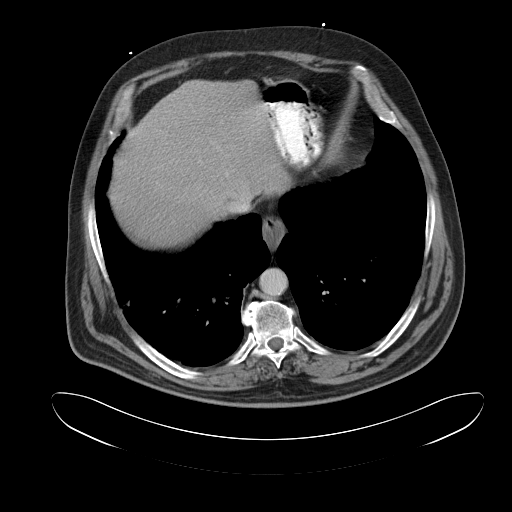
[im 85/96  lung]
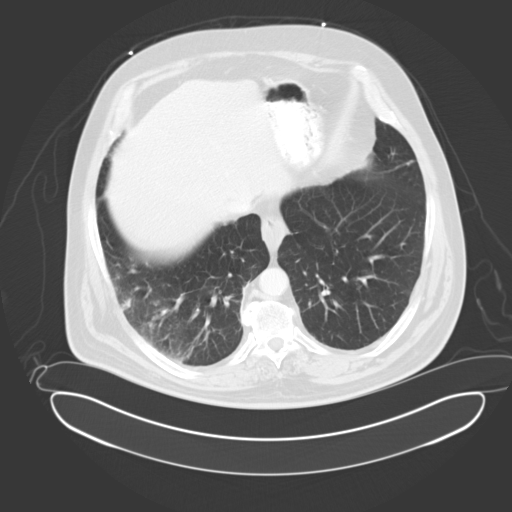
[im 90/96  soft-tissue]
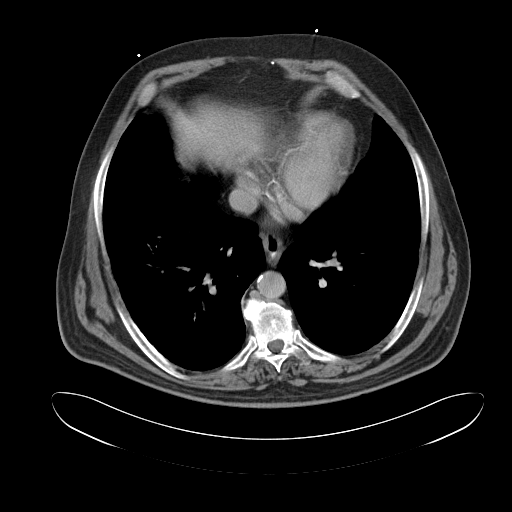
[im 90/96  lung]
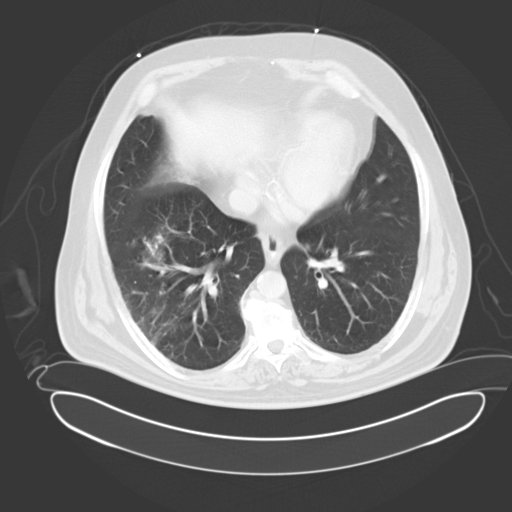

[14 of 32 positions shown; findings below may reference images not displayed]

FINDINGS: Musculoskeletal: Mild lumbar degenerative disc and facet disease.
Degenerative facet disease most pronounced at L4-L5. Subchondral
cysts are present in the superior L5 articular processes
bilaterally. No aggressive osseous lesions.

Lung Bases: Tree-in-bud micronodularity in the RIGHT lower lobe.
Dependent atelectasis. This pattern is most commonly associated with
bronchopneumonia but can also be seen in other forms of airspace
disease such as aspiration.

Liver:  Normal.

Spleen:  Normal.

Gallbladder:  Cholecystectomy.

Common bile duct:  Normal.

Pancreas:  Normal.

Adrenal glands: Small LEFT adrenal nodule is chronic and appears
slightly smaller than on the prior exam from 0850, compatible with
an adenoma.

Kidneys: Normal enhancement and delayed excretion of contrast. LEFT
ureter normal. RIGHT ureter normal.

Stomach: Small hiatal hernia. Thickening of the distal esophagus,
most commonly associated with reflux.

Small bowel:  Normal.

Colon:   Normal appendix.  Normal appearance of the colon.

Pelvic Genitourinary:  Normal.

Peritoneum: No free air or free fluid.

Vascular/lymphatic: Atherosclerosis.  No acute vascular abnormality.

Body Wall: Fat containing LEFT inguinal hernia. Small fat containing
periumbilical hernia.
IMPRESSION: 1. New RIGHT lower lobe airspace disease compatible with
bronchopneumonia.
2. No acute abdominal abnormality.
3. Atherosclerosis.
4. Cholecystectomy.
5. Small hiatal hernia and distal esophageal thickening, most
commonly associated with gastroesophageal reflux.
6. Stable LEFT adrenal adenoma.

## 2017-04-01 ENCOUNTER — Ambulatory Visit: Payer: Medicare HMO | Admitting: Family

## 2017-04-05 ENCOUNTER — Other Ambulatory Visit: Payer: Self-pay | Admitting: Family Medicine

## 2017-04-05 IMAGING — CT CT ABD-PELV W/ CM
1 of 3 series · 13 of 32 positions shown, 18 images · IV contrast (omnipaque)
Comparison: CT of the abdomen and pelvis performed 05/17/2015

CLINICAL DATA: Acute onset of generalized abdominal pain and chest
pain. Nausea. Initial encounter.

EXAM:
CT ABDOMEN AND PELVIS WITH CONTRAST
TECHNIQUE: Multidetector CT imaging of the abdomen and pelvis was performed
using the standard protocol following bolus administration of
intravenous contrast.
CONTRAST:  100mL OMNIPAQUE IOHEXOL 300 MG/ML  SOLN

[Series 2: routine abd pel with · axial · 0.83mm/px · z∈[-1076,-676]mm · 13 of 90 slices shown, 18 images]
[im 5/90  soft-tissue]
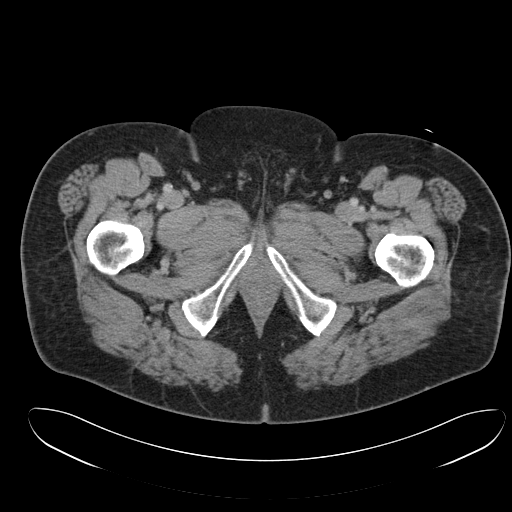
[im 5/90  bone]
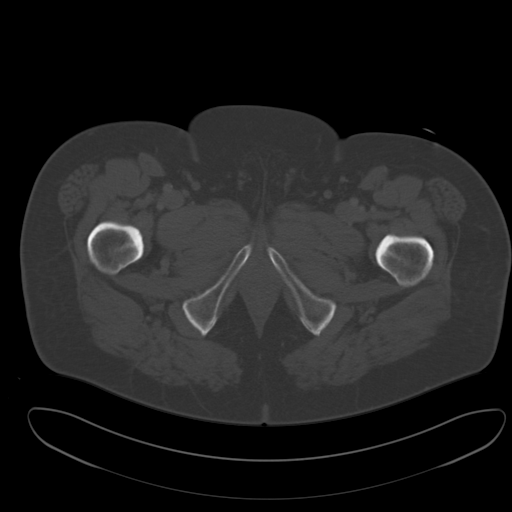
[im 15/90  soft-tissue]
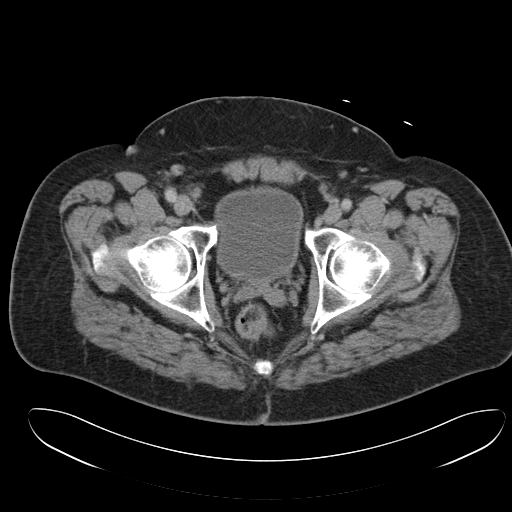
[im 19/90  soft-tissue]
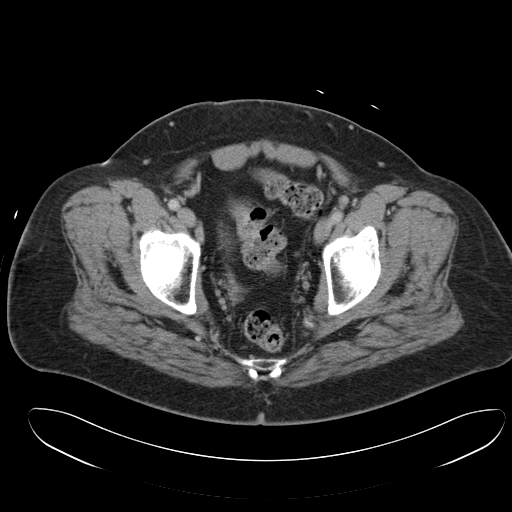
[im 29/90  soft-tissue]
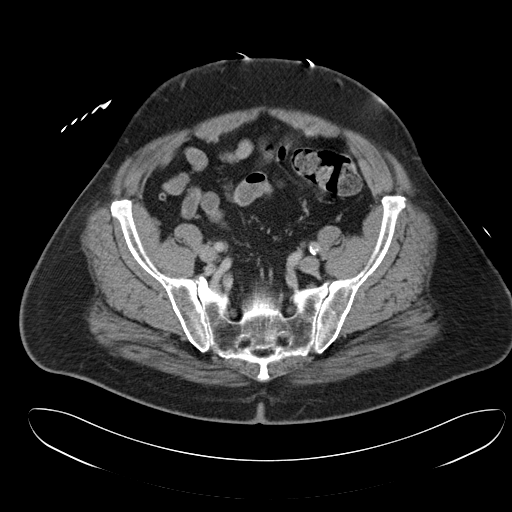
[im 33/90  soft-tissue]
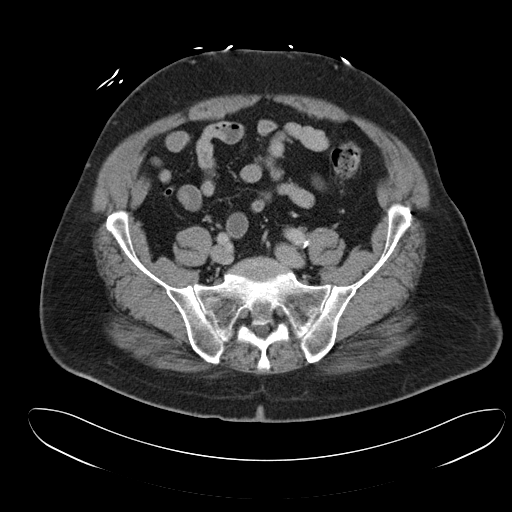
[im 43/90  soft-tissue]
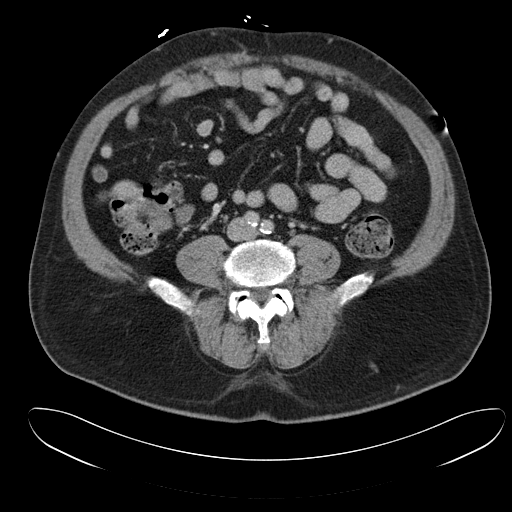
[im 47/90  soft-tissue]
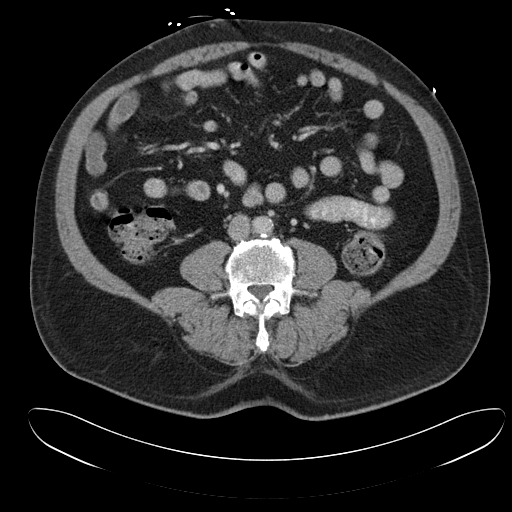
[im 57/90  soft-tissue]
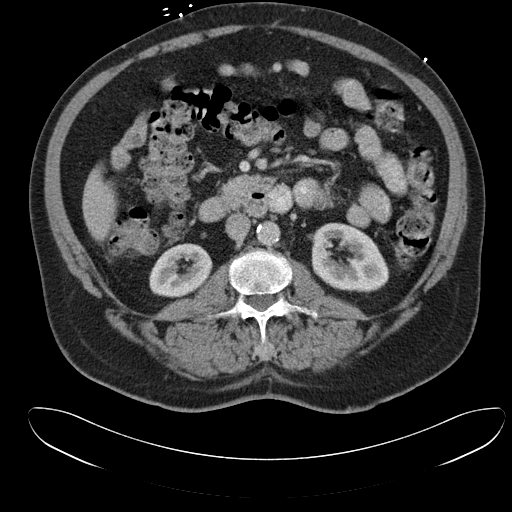
[im 61/90  soft-tissue]
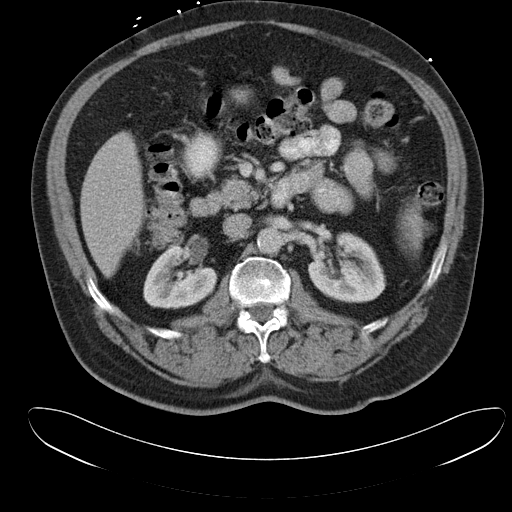
[im 61/90  bone]
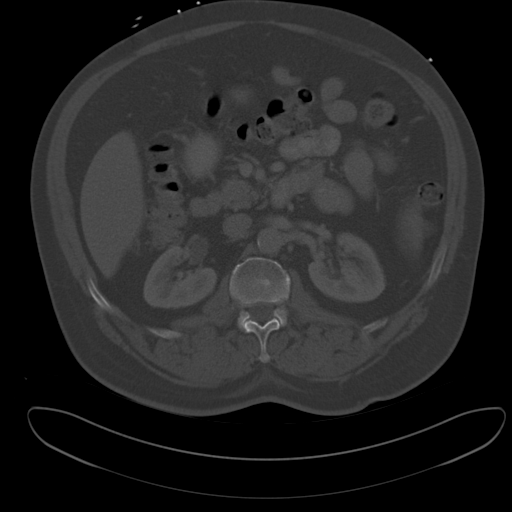
[im 71/90  soft-tissue]
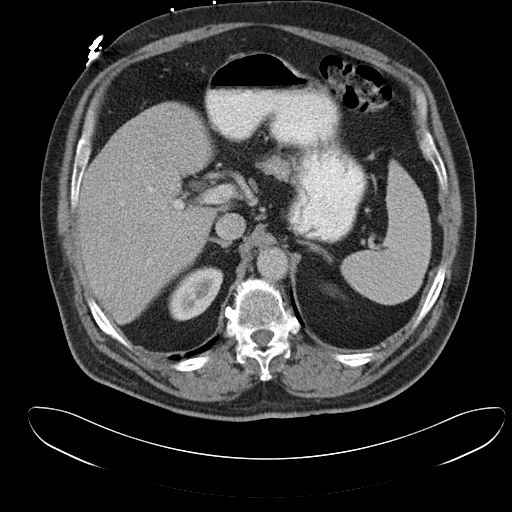
[im 71/90  lung]
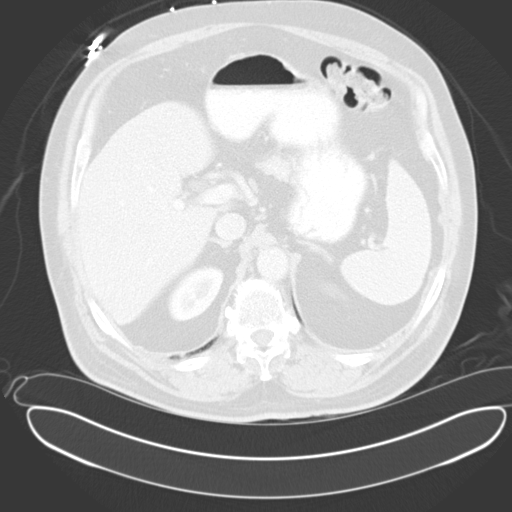
[im 75/90  soft-tissue]
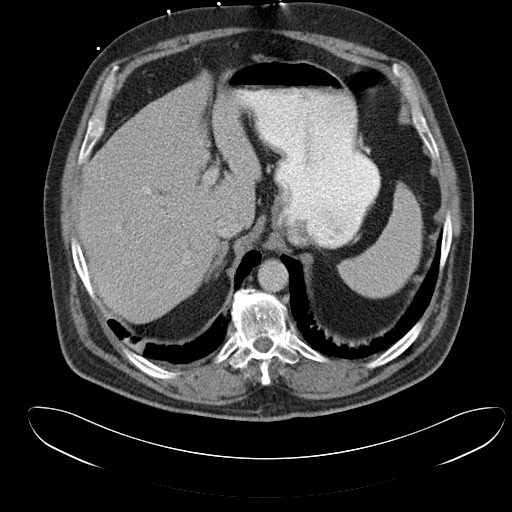
[im 75/90  lung]
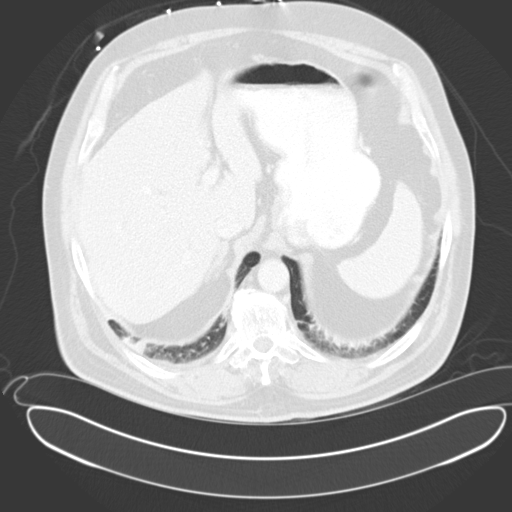
[im 80/90  lung]
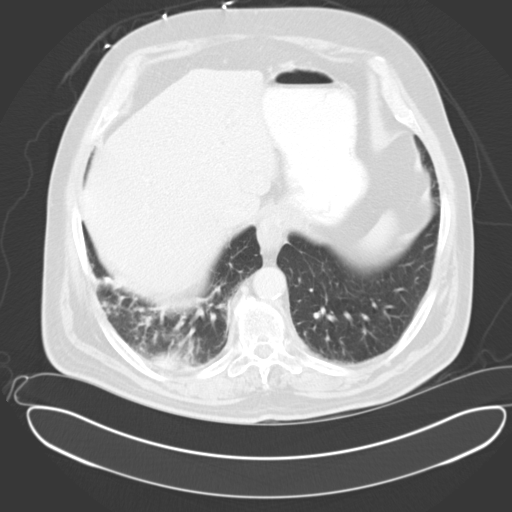
[im 85/90  soft-tissue]
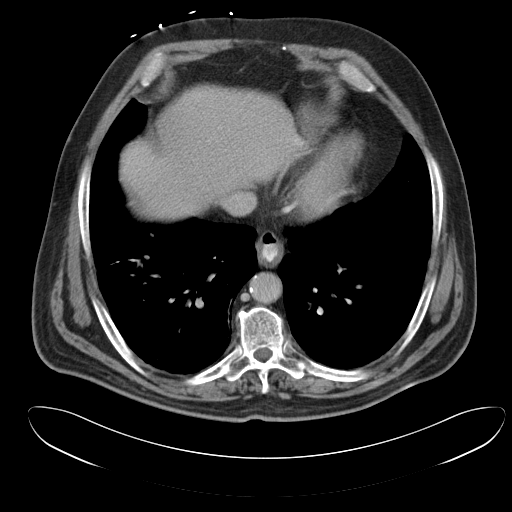
[im 85/90  lung]
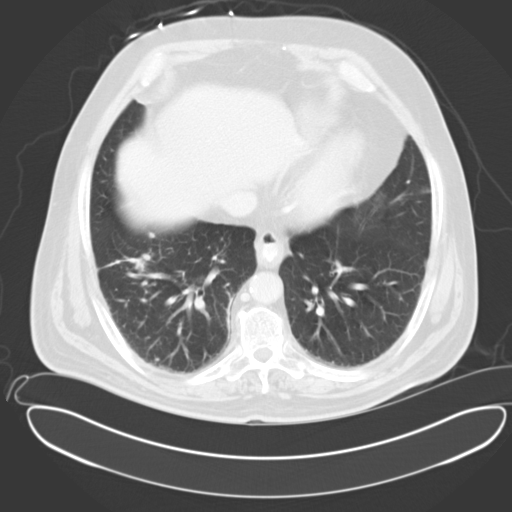

[13 of 32 positions shown; findings below may reference images not displayed]

FINDINGS: Right basilar airspace opacity raises concern for mild pneumonia.
Underlying nodularity likely relates to the infection, given lack of
any nodules on earlier CTs. This is mildly worsened from the prior
study. Mild coronary artery calcification is noted.

Mild wall thickening at the distal esophagus could reflect mild
esophagitis or chronic inflammatory change.

The liver and spleen are unremarkable in appearance. The patient is
status post cholecystectomy, with clips noted along the gallbladder
fossa. The pancreas and adrenal glands are unremarkable.

The kidneys are unremarkable in appearance. There is no evidence of
hydronephrosis. No renal or ureteral stones are seen. No perinephric
stranding is appreciated.

No free fluid is identified. The small bowel is unremarkable in
appearance. The stomach is within normal limits. No acute vascular
abnormalities are seen. Scattered calcification is noted along the
abdominal aorta and its branches.

The appendix is normal in caliber and contains air, without evidence
of appendicitis. Scattered diverticulosis is noted along the mid
sigmoid colon, without evidence of diverticulitis.

The bladder is mildly distended and grossly unremarkable. The
prostate remains normal in size. A small left inguinal hernia is
seen, containing only fat. No inguinal lymphadenopathy is seen.

No acute osseous abnormalities are identified.
IMPRESSION: 1. Mildly worsened right basilar pneumonia noted. Underlying
nodularity likely relates to the infection.
2. Mild wall thickening at the distal esophagus could reflect mild
esophagitis or chronic inflammatory change.
3. Scattered calcification along the abdominal aorta and its
branches.
4. Scattered diverticulosis along the mid sigmoid colon, without
evidence of diverticulitis.
5. Small left inguinal hernia, containing only fat.

## 2017-04-11 DIAGNOSIS — J439 Emphysema, unspecified: Secondary | ICD-10-CM | POA: Diagnosis not present

## 2017-04-11 DIAGNOSIS — G4710 Hypersomnia, unspecified: Secondary | ICD-10-CM | POA: Diagnosis not present

## 2017-04-11 DIAGNOSIS — I10 Essential (primary) hypertension: Secondary | ICD-10-CM | POA: Diagnosis not present

## 2017-04-11 DIAGNOSIS — J449 Chronic obstructive pulmonary disease, unspecified: Secondary | ICD-10-CM | POA: Diagnosis not present

## 2017-04-14 IMAGING — CR DG KNEE COMPLETE 4+V*L*
4 series · 4 of 4 positions shown · non-contrast
Comparison: None.

CLINICAL DATA: The patient heard the left knee pop last night with
no known injury.

EXAM:
LEFT KNEE - COMPLETE 4+ VIEW

[knee ap]
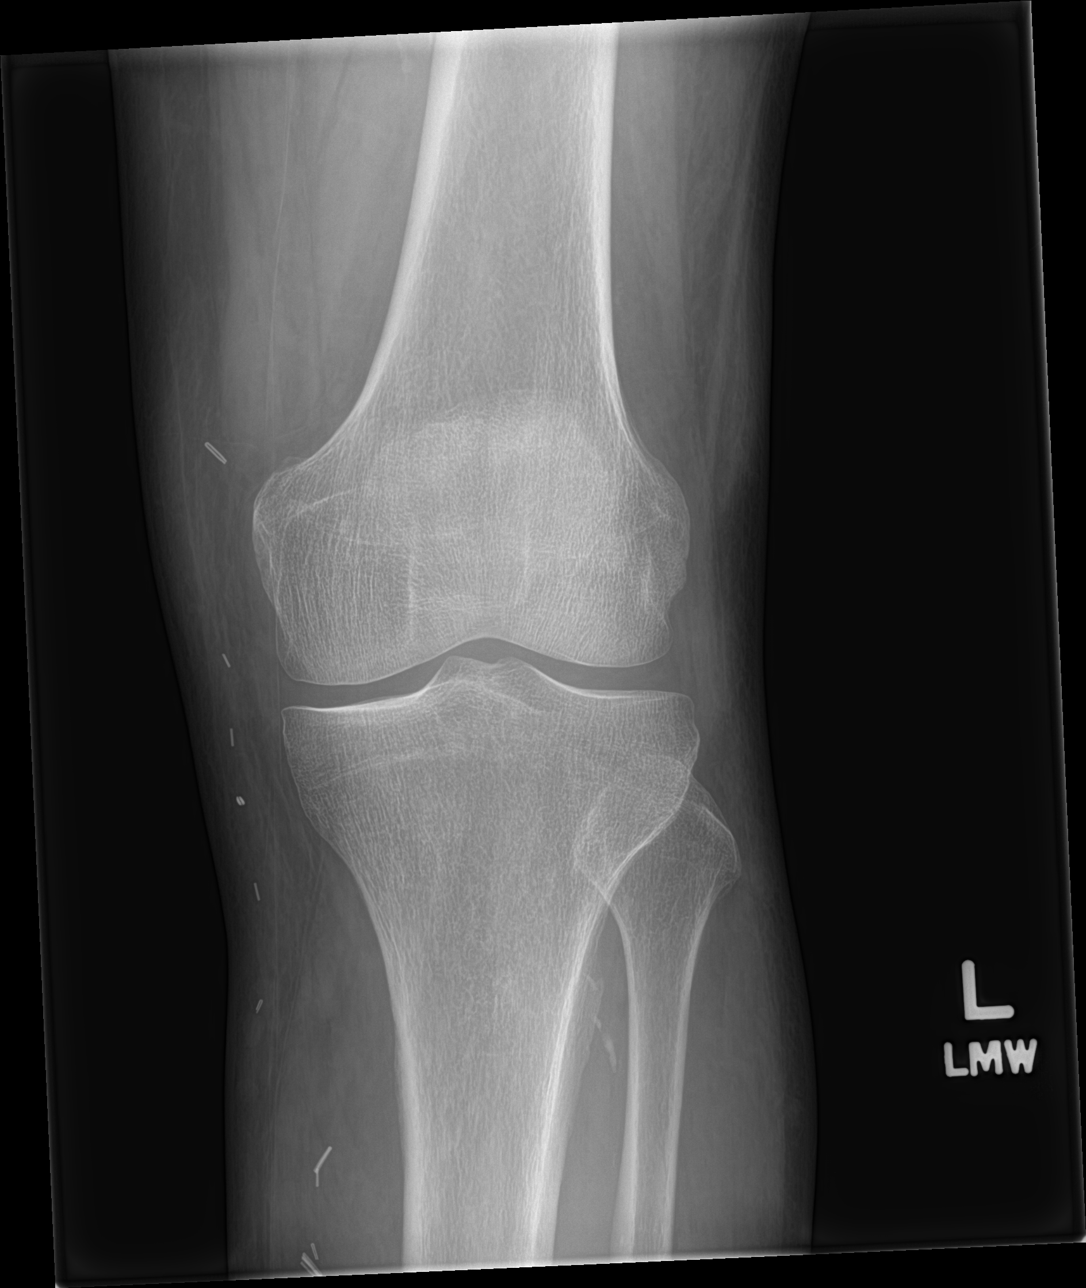

[knee obl (1 of 2)]
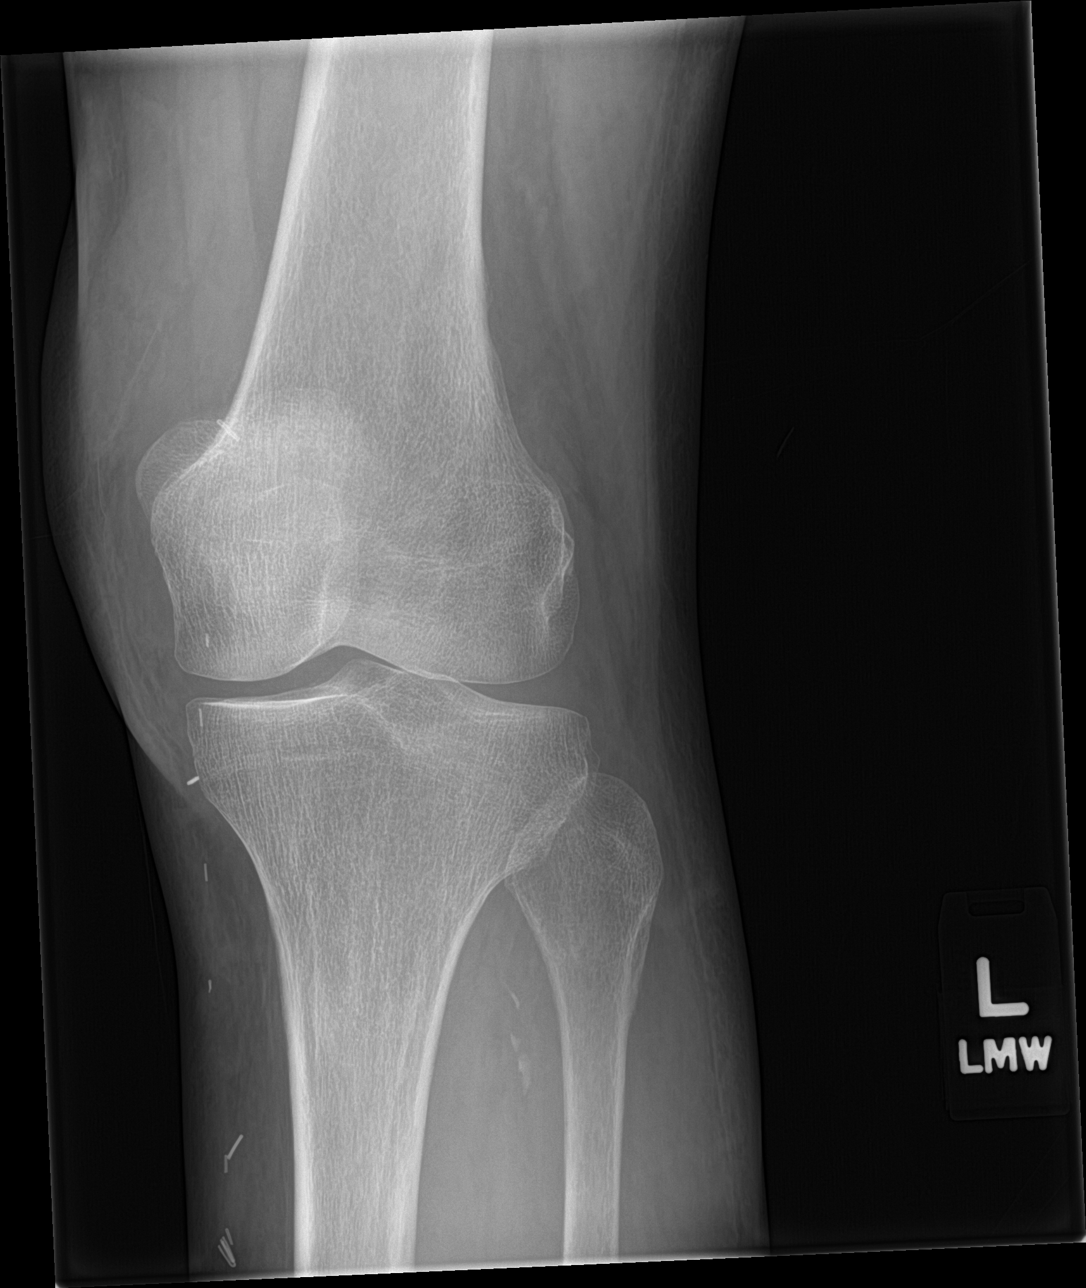

[knee obl (2 of 2)]
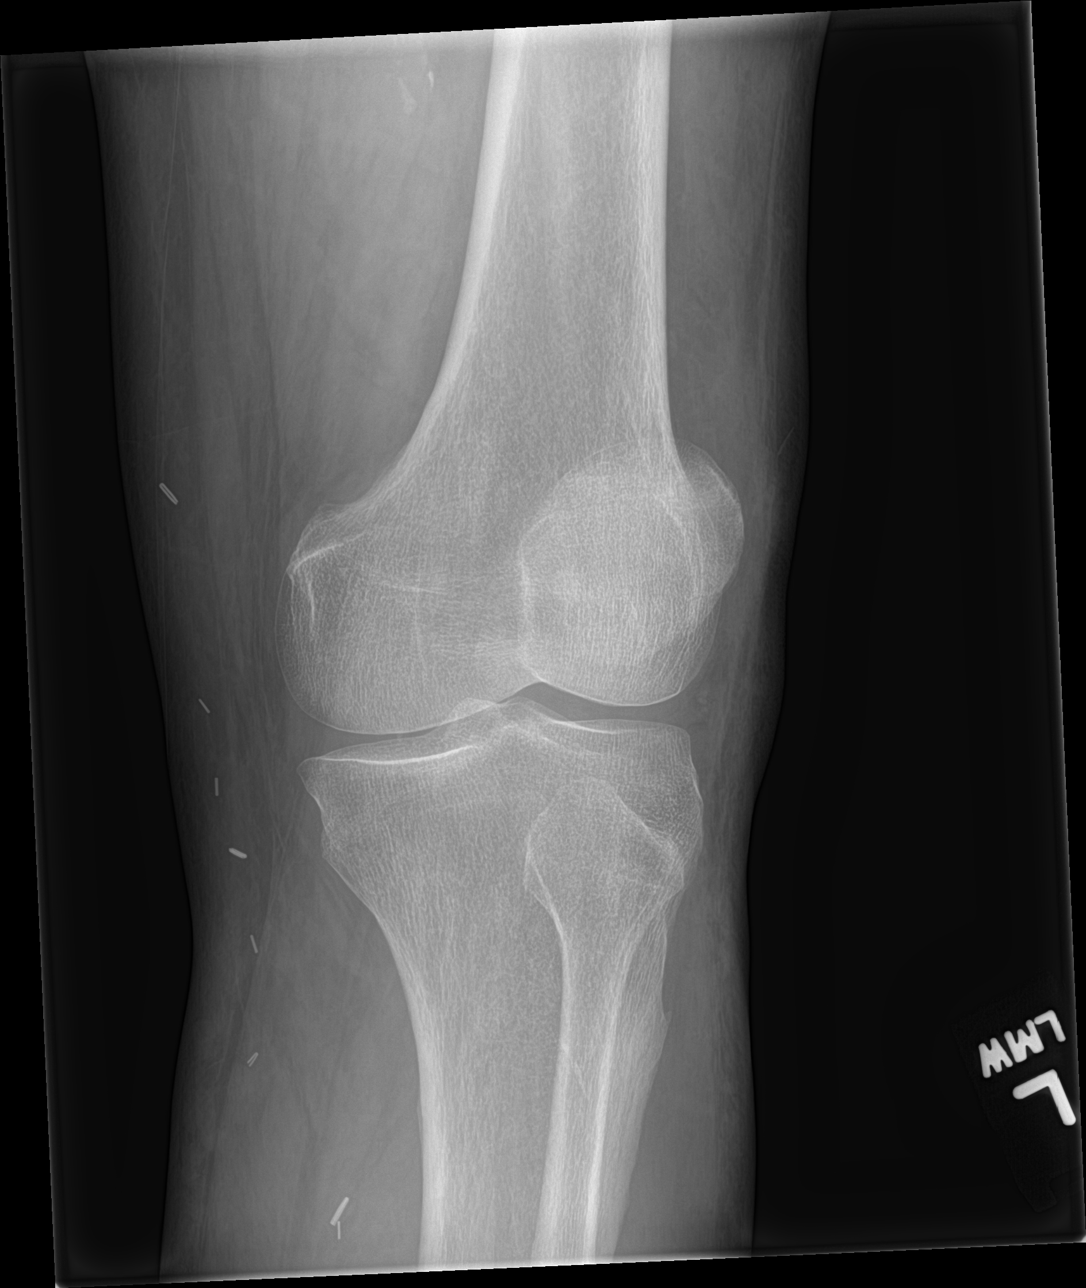

[knee lat]
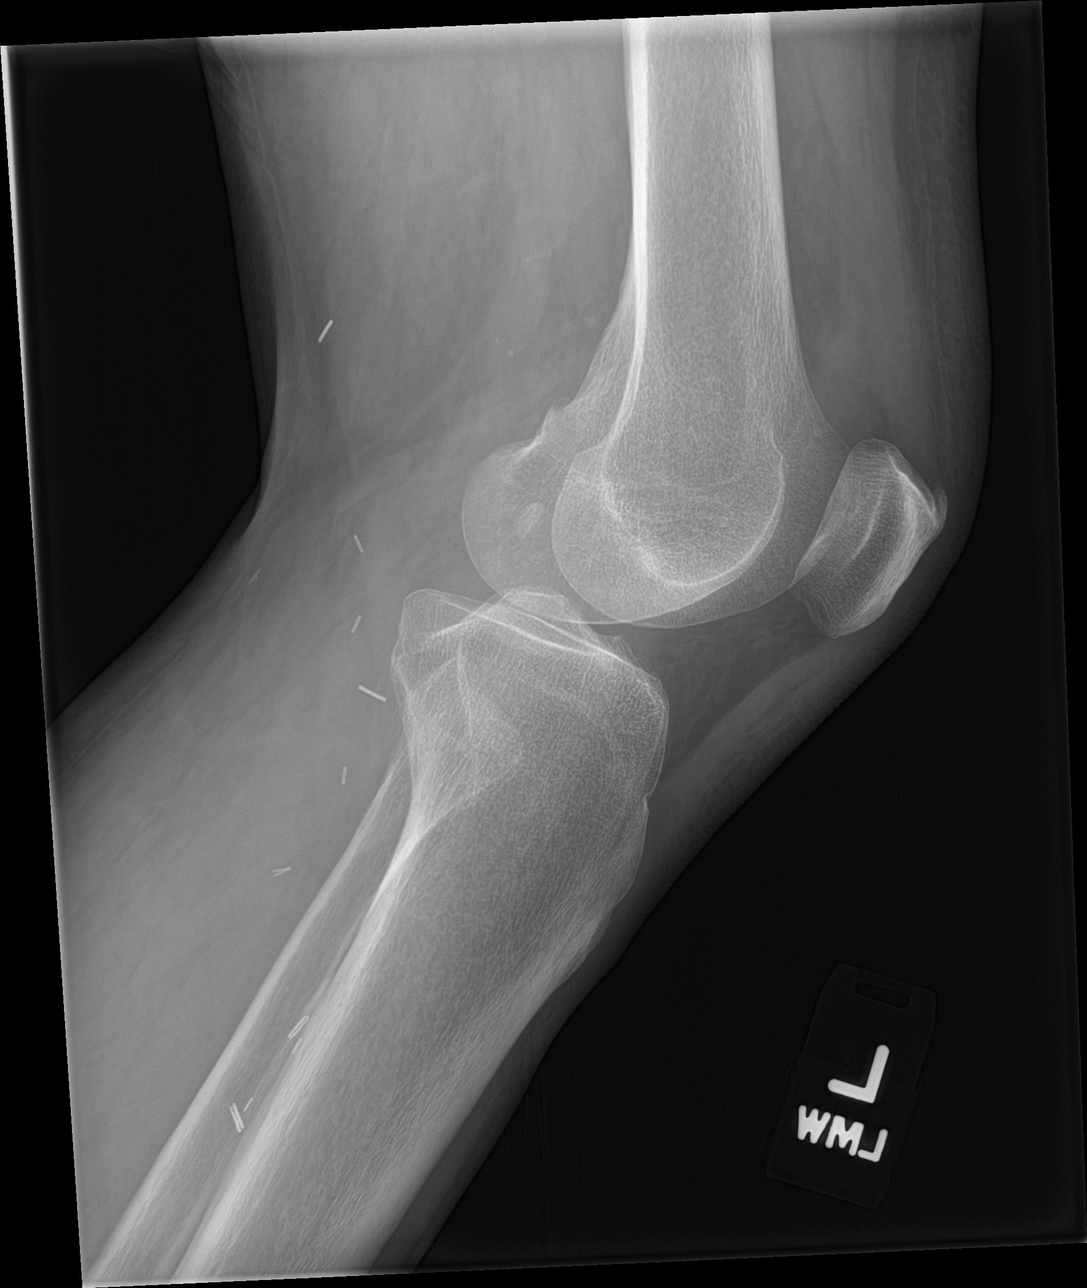

[4 of 4 positions shown; findings below may reference images not displayed]

FINDINGS: There is no evidence of fracture, dislocation. There is a small
suprapatellar effusion. There is no evidence of arthropathy or other
focal bone abnormality. Surgical clips are identified in the medial
soft tissues.
IMPRESSION: No acute bony abnormality identified.

## 2017-04-22 ENCOUNTER — Other Ambulatory Visit: Payer: Self-pay | Admitting: Family Medicine

## 2017-04-22 MED ORDER — OXYCODONE-ACETAMINOPHEN 10-325 MG PO TABS: 1.0000 | ORAL_TABLET | ORAL | 0 refills | Status: DC | PRN

## 2017-04-22 NOTE — Telephone Encounter (Signed)
Pt contacted office for refill request on the following medications:  oxyCODONE-acetaminophen (PERCOCET) 10-325 MG tablet.  XF#[phone]/[place]

## 2017-04-27 IMAGING — CR DG CHEST 2V
1 series · 2 of 2 positions shown · non-contrast
Comparison: 05/10/2015 and 03/07/2015

CLINICAL DATA: Persistent chest pain and shortness of breath. On
home oxygen.

EXAM:
CHEST  2 VIEW

[Series 1: dg chest 2 view · 0.14mm/px · 2 of 2 slices shown]
[im 1/2]
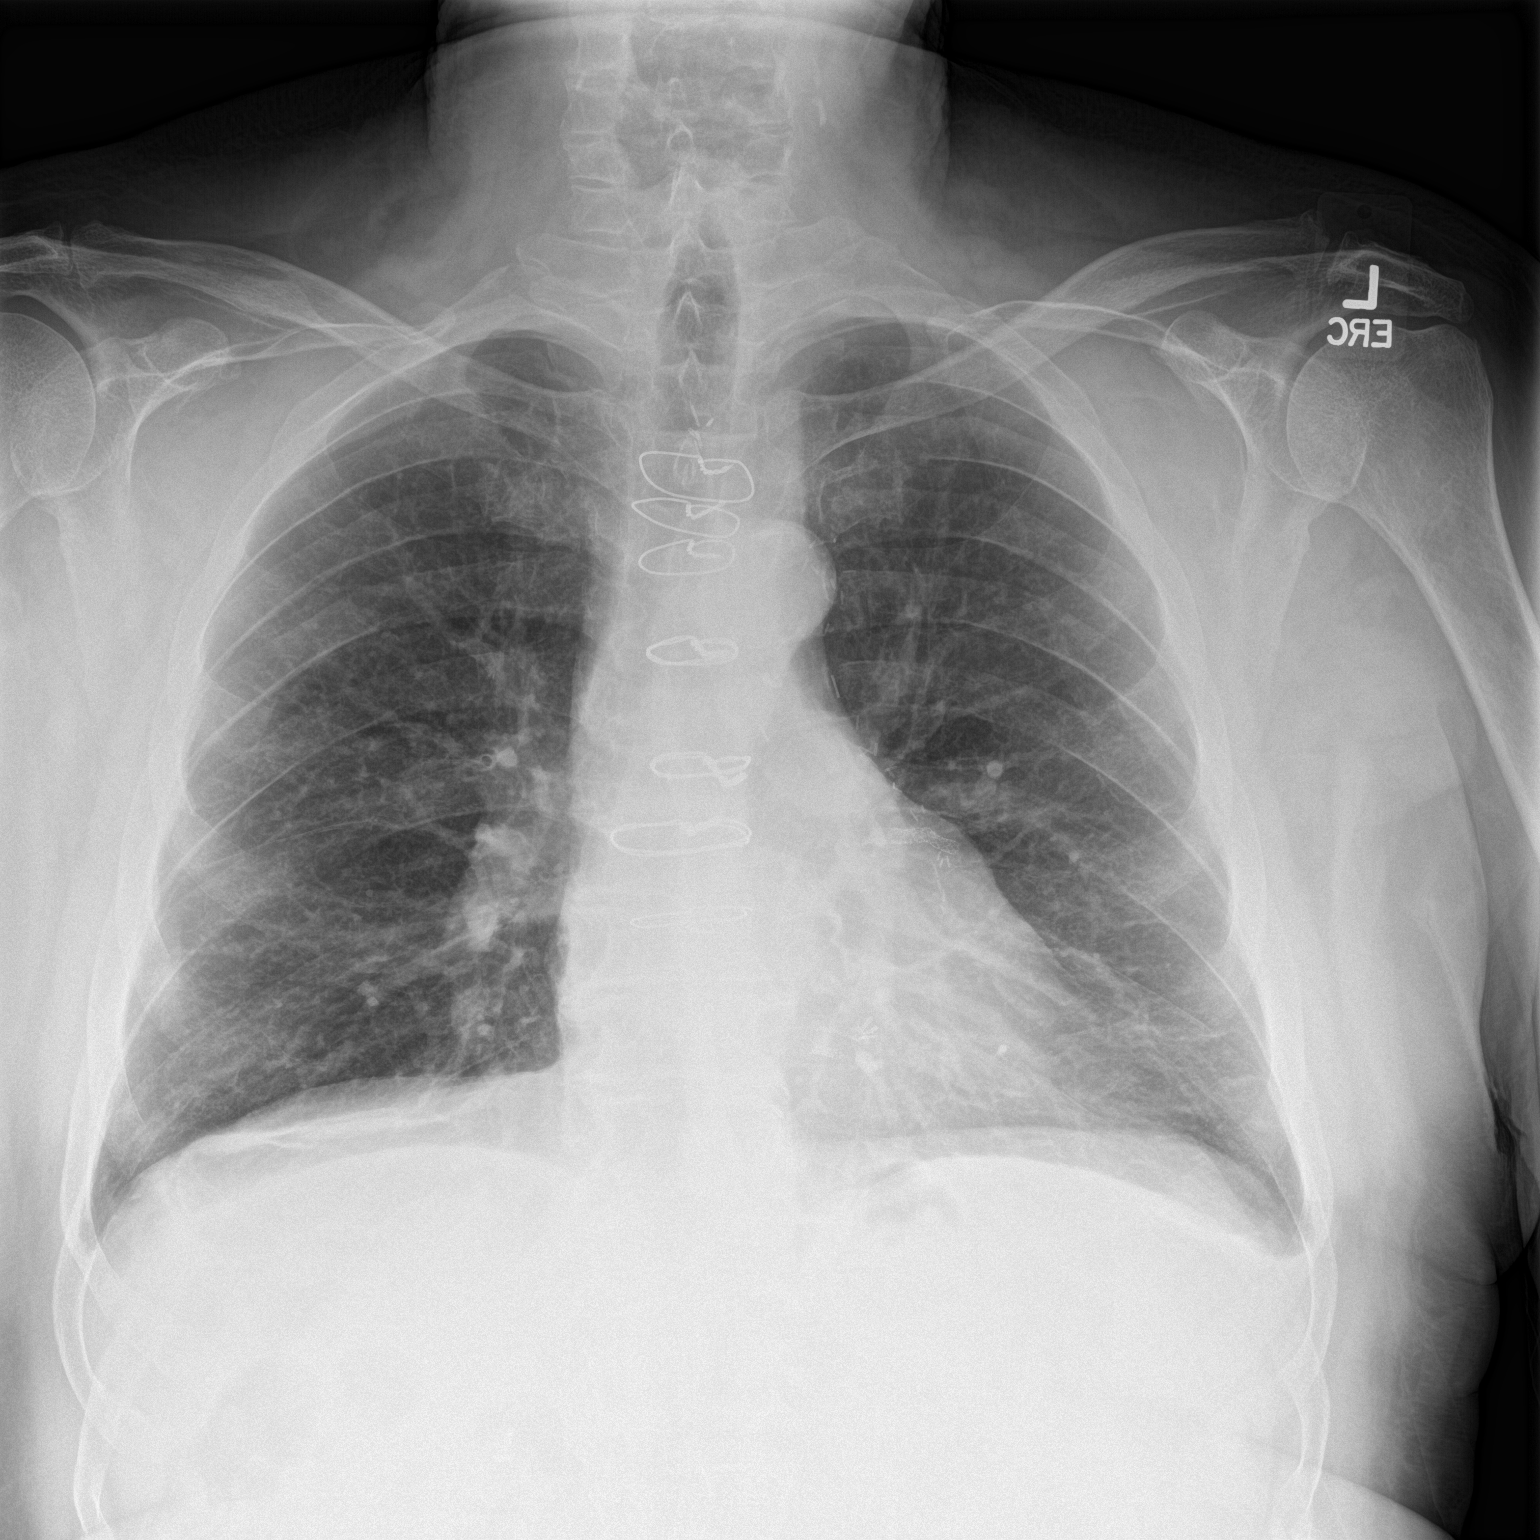
[im 2/2]
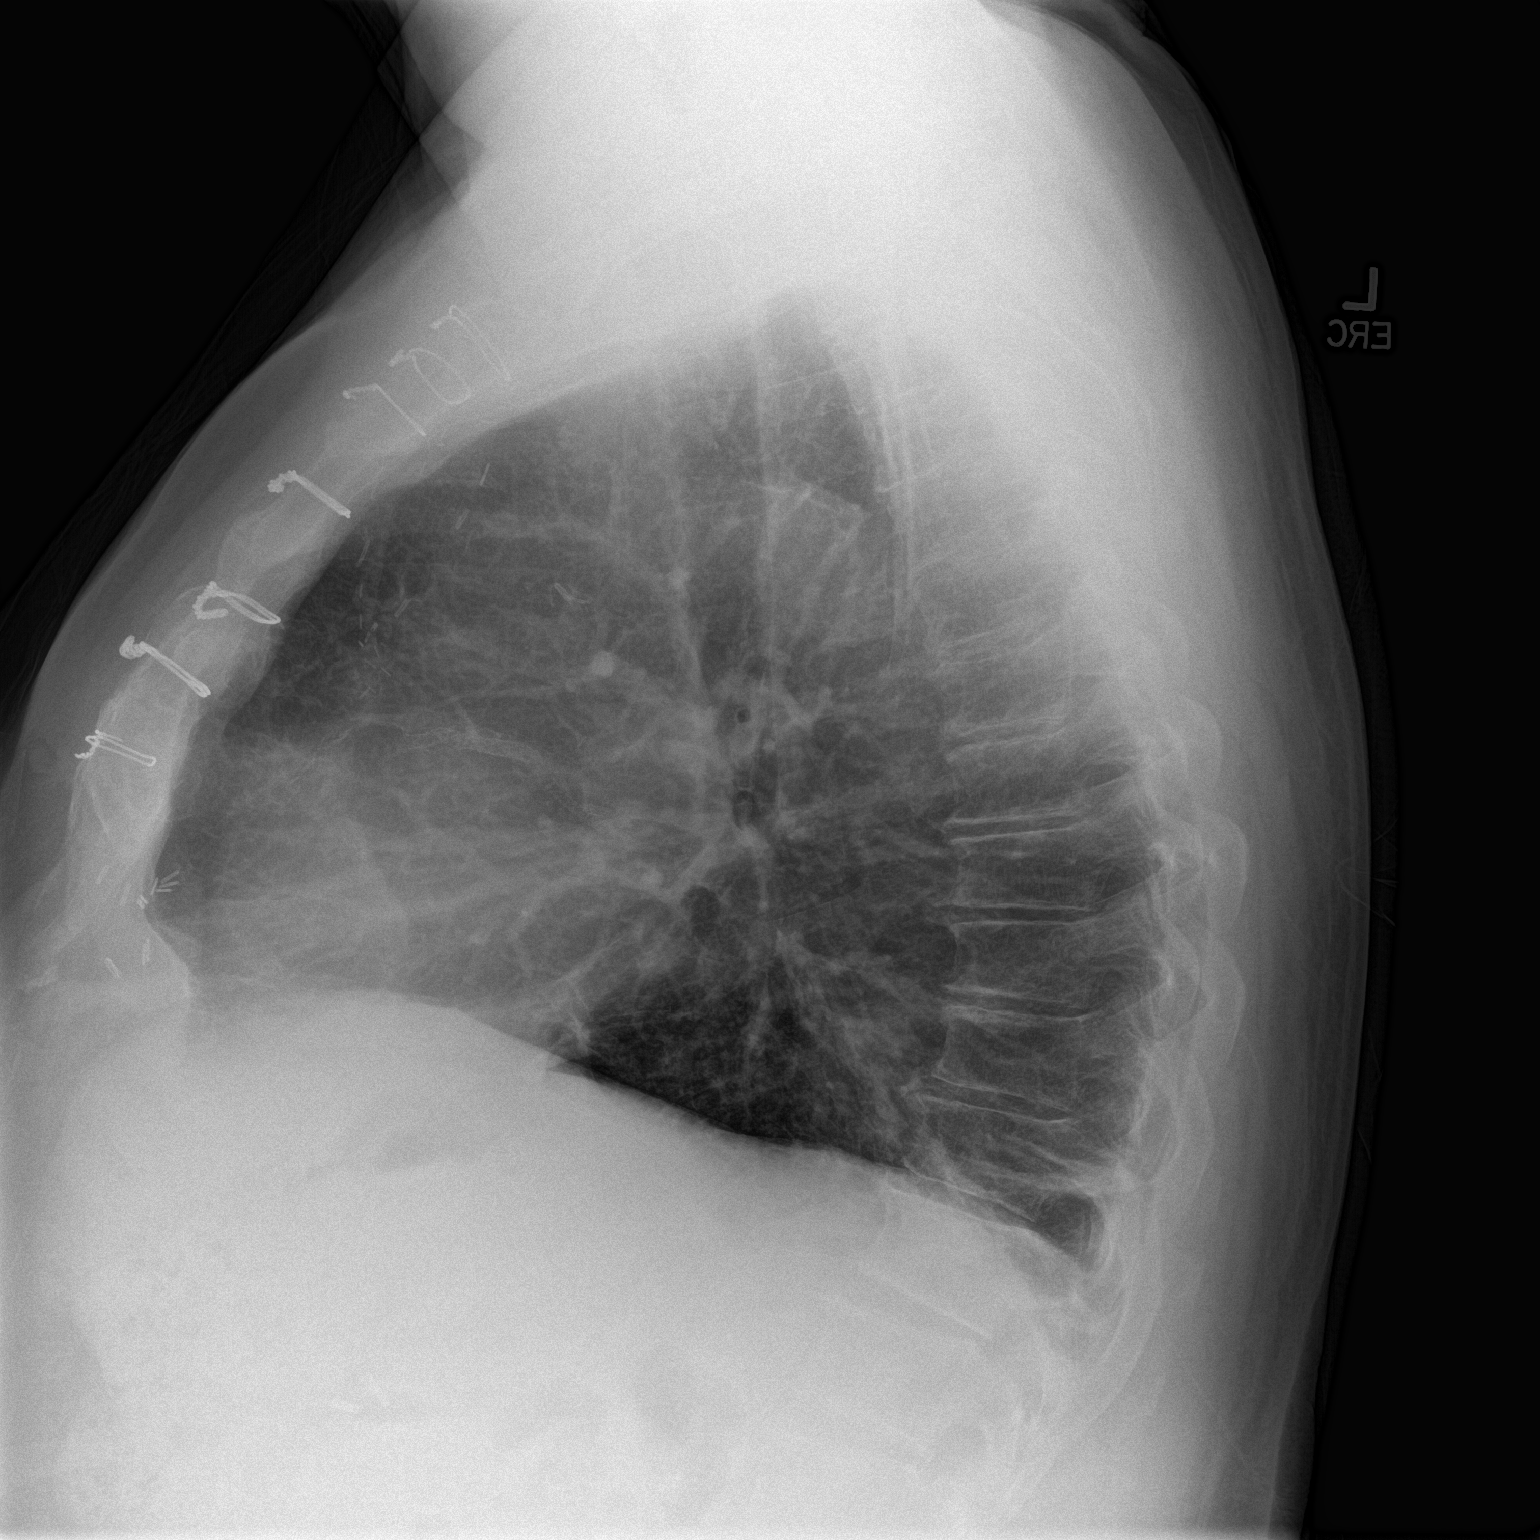

[2 of 2 positions shown; findings below may reference images not displayed]

FINDINGS: Sternotomy wires are unchanged. Lungs are adequately inflated with
minimal linear atelectasis over the posterior right base. No
evidence of effusion or lobar consolidation. Cardiomediastinal
silhouette is within normal. Coronary stent is present. Mild
calcified plaque over the thoracic aorta. Mild degenerative change
of the spine.
IMPRESSION: No acute cardiopulmonary disease. Minimal linear atelectasis right
base.

## 2017-05-02 DIAGNOSIS — E1122 Type 2 diabetes mellitus with diabetic chronic kidney disease: Secondary | ICD-10-CM | POA: Diagnosis not present

## 2017-05-02 DIAGNOSIS — Z7901 Long term (current) use of anticoagulants: Secondary | ICD-10-CM | POA: Insufficient documentation

## 2017-05-02 DIAGNOSIS — Z7984 Long term (current) use of oral hypoglycemic drugs: Secondary | ICD-10-CM | POA: Diagnosis not present

## 2017-05-02 DIAGNOSIS — J449 Chronic obstructive pulmonary disease, unspecified: Secondary | ICD-10-CM | POA: Diagnosis not present

## 2017-05-02 DIAGNOSIS — Z87891 Personal history of nicotine dependence: Secondary | ICD-10-CM | POA: Diagnosis not present

## 2017-05-02 DIAGNOSIS — I25110 Atherosclerotic heart disease of native coronary artery with unstable angina pectoris: Secondary | ICD-10-CM | POA: Diagnosis not present

## 2017-05-02 DIAGNOSIS — R1012 Left upper quadrant pain: Secondary | ICD-10-CM | POA: Insufficient documentation

## 2017-05-02 DIAGNOSIS — K209 Esophagitis, unspecified: Secondary | ICD-10-CM | POA: Diagnosis not present

## 2017-05-02 DIAGNOSIS — J45909 Unspecified asthma, uncomplicated: Secondary | ICD-10-CM | POA: Insufficient documentation

## 2017-05-02 DIAGNOSIS — Z7902 Long term (current) use of antithrombotics/antiplatelets: Secondary | ICD-10-CM | POA: Diagnosis not present

## 2017-05-02 DIAGNOSIS — R109 Unspecified abdominal pain: Secondary | ICD-10-CM | POA: Diagnosis not present

## 2017-05-02 DIAGNOSIS — K2900 Acute gastritis without bleeding: Secondary | ICD-10-CM | POA: Diagnosis not present

## 2017-05-02 DIAGNOSIS — I129 Hypertensive chronic kidney disease with stage 1 through stage 4 chronic kidney disease, or unspecified chronic kidney disease: Secondary | ICD-10-CM | POA: Diagnosis not present

## 2017-05-02 DIAGNOSIS — Z79899 Other long term (current) drug therapy: Secondary | ICD-10-CM | POA: Insufficient documentation

## 2017-05-02 DIAGNOSIS — N189 Chronic kidney disease, unspecified: Secondary | ICD-10-CM | POA: Diagnosis not present

## 2017-05-02 NOTE — ED Triage Notes (Signed)
Pt reports to ED w/ c/o "nodule" to L abd. Pt co 9/10 pain w/o radiation, worse w/ palpation and eating. Pt A/OX4, denies any CP or SOB.  Pt ambulatory w/o issue.  This RN unable to palpate "nodule" in triage.  NAD.

## 2017-05-03 ENCOUNTER — Emergency Department
Admission: EM | Admit: 2017-05-03 | Discharge: 2017-05-03 | Disposition: A | Discharge status: Home / Self Care | Payer: Medicare HMO | Attending: Emergency Medicine | Admitting: Emergency Medicine

## 2017-05-03 ENCOUNTER — Emergency Department: Payer: Medicare HMO

## 2017-05-03 ENCOUNTER — Encounter: Payer: Self-pay | Admitting: Radiology

## 2017-05-03 DIAGNOSIS — R109 Unspecified abdominal pain: Secondary | ICD-10-CM | POA: Diagnosis not present

## 2017-05-03 DIAGNOSIS — R1012 Left upper quadrant pain: Secondary | ICD-10-CM

## 2017-05-03 DIAGNOSIS — J449 Chronic obstructive pulmonary disease, unspecified: Secondary | ICD-10-CM | POA: Diagnosis not present

## 2017-05-03 DIAGNOSIS — F17211 Nicotine dependence, cigarettes, in remission: Secondary | ICD-10-CM | POA: Diagnosis not present

## 2017-05-03 DIAGNOSIS — K2900 Acute gastritis without bleeding: Secondary | ICD-10-CM

## 2017-05-03 DIAGNOSIS — K209 Esophagitis, unspecified without bleeding: Secondary | ICD-10-CM

## 2017-05-03 DIAGNOSIS — J9611 Chronic respiratory failure with hypoxia: Secondary | ICD-10-CM | POA: Diagnosis not present

## 2017-05-03 DIAGNOSIS — R0602 Shortness of breath: Secondary | ICD-10-CM | POA: Diagnosis not present

## 2017-05-03 DIAGNOSIS — G4733 Obstructive sleep apnea (adult) (pediatric): Secondary | ICD-10-CM | POA: Diagnosis not present

## 2017-05-03 LAB — COMPREHENSIVE METABOLIC PANEL
ALT: 14 U/L — ABNORMAL LOW (ref 17–63)
AST: 20 U/L (ref 15–41)
Albumin: 3.7 g/dL (ref 3.5–5.0)
Alkaline Phosphatase: 75 U/L (ref 38–126)
Anion gap: 7 (ref 5–15)
BUN: 18 mg/dL (ref 6–20)
CO2: 32 mmol/L (ref 22–32)
Calcium: 9.2 mg/dL (ref 8.9–10.3)
Chloride: 97 mmol/L — ABNORMAL LOW (ref 101–111)
Creatinine, Ser: 1.36 mg/dL — ABNORMAL HIGH (ref 0.61–1.24)
GFR calc Af Amer: >60 mL/min (ref >60)
GFR calc non Af Amer: 54 mL/min — ABNORMAL LOW (ref >60)
Glucose, Bld: 167 mg/dL — ABNORMAL HIGH (ref 65–99)
Potassium: 3.7 mmol/L (ref 3.5–5.1)
Sodium: 136 mmol/L (ref 135–145)
Total Bilirubin: 0.8 mg/dL (ref 0.3–1.2)
Total Protein: 6.6 g/dL (ref 6.5–8.1)

## 2017-05-03 LAB — CBC
HCT: 44.9 % (ref 40.0–52.0)
Hemoglobin: 15 g/dL (ref 13.0–18.0)
MCH: 30.4 pg (ref 26.0–34.0)
MCHC: 33.3 g/dL (ref 32.0–36.0)
MCV: 91.4 fL (ref 80.0–100.0)
Platelets: 178 10*3/uL (ref 150–440)
RBC: 4.92 MIL/uL (ref 4.40–5.90)
RDW: 15.3 % — ABNORMAL HIGH (ref 11.5–14.5)
WBC: 6.4 10*3/uL (ref 3.8–10.6)

## 2017-05-03 LAB — LIPASE, BLOOD: Lipase: 29 U/L (ref 11–51)

## 2017-05-03 LAB — TROPONIN I: Troponin I: <0.03 ng/mL (ref <0.03)

## 2017-05-03 MED ORDER — GI COCKTAIL ~~LOC~~
30.0000 mL | Freq: Once | ORAL | Status: AC
  Administered 2017-05-03: 30 mL via ORAL
  Filled 2017-05-03: qty 30

## 2017-05-03 MED ORDER — IOPAMIDOL (ISOVUE-300) INJECTION 61%
30.0000 mL | Freq: Once | INTRAVENOUS | Status: AC
  Administered 2017-05-03: 30 mL via ORAL

## 2017-05-03 MED ORDER — MAGNESIUM SULFATE 2 GM/50ML IV SOLN
2.0000 g | Freq: Once | INTRAVENOUS | Status: AC
  Administered 2017-05-03: 2 g via INTRAVENOUS
  Filled 2017-05-03: qty 50

## 2017-05-03 MED ORDER — SUCRALFATE 1 G PO TABS
1.0000 g | ORAL_TABLET | Freq: Once | ORAL | Status: AC
  Administered 2017-05-03: 1 g via ORAL
  Filled 2017-05-03: qty 1

## 2017-05-03 MED ORDER — IOPAMIDOL (ISOVUE-300) INJECTION 61%
100.0000 mL | Freq: Once | INTRAVENOUS | Status: AC | PRN
  Administered 2017-05-03: 100 mL via INTRAVENOUS

## 2017-05-03 MED ORDER — SUCRALFATE 1 G PO TABS: 1.0000 g | ORAL_TABLET | Freq: Two times a day (BID) | ORAL | 0 refills | Status: DC

## 2017-05-03 MED ORDER — FAMOTIDINE 40 MG PO TABS: 40.0000 mg | ORAL_TABLET | Freq: Every evening | ORAL | 0 refills | Status: DC

## 2017-05-03 NOTE — Discharge Instructions (Signed)
Please follow-up with your primary care physician for further evaluation.

## 2017-05-03 NOTE — ED Notes (Signed)
Patient discharge and follow up information reviewed with patient by ED nursing staff and patient given the opportunity to ask questions pertaining to ED visit and discharge plan of care. Patient advised that should symptoms not continue to improve, resolve entirely, or should new symptoms develop then a follow up visit with their PCP or a return visit to the ED may be warranted. Patient verbalized consent and understanding of discharge plan of care including potential need for further evaluation. Patient being discharged in stable condition per attending ED physician on duty.   

## 2017-05-03 NOTE — ED Provider Notes (Signed)
[facility] Emergency Department Provider Note   ____________________________________________   First MD Initiated Contact with Patient 05/03/17 [phone]     (approximate)  I have reviewed the triage vital signs and the nursing notes.   HISTORY  Chief Complaint Hernia    HPI Lawrence [name] is a 61 y.o. male who comes into the hospital today with some upper abdominal pain. The patient feels like he may have a hernia.The patient reports that her seat and even hurts to touch. He reports that his left upper quadrant pain. The pain started on Saturday but it was worse but Sunday morning. The patient reports that he takes Percocet regularly but it didn't help with the pain. The patient rates his pain 8-9 out of 10 in intensity. The patient states that he had this pain once before and it turned out to be pancreatitis. He has had pancreatitis several times. He's had some nausea with no vomiting. The patient has had some shortness of breath secondary to COPD but that is normal for him. The patient does use oxygen every day. He is here today for evaluation.   Past Medical History:  Diagnosis Date  . A-fib ([place])   . Anemia   . Anxiety   . Asthma   . CAD (coronary artery disease)    a. 2002 CABGx2 (LIMA->LAD, VG->VG->OM1);  b. 09/2012 DES->OM;  c. 03/2015 PTCA of LAD [facility]) in setting of atretic LIMA; d. 05/2015 Cath [facility]): nonobs dzs; e. 06/2015 Cath ([facility]): LM nl, LAD 45p/d ISR, 50d, D1/2 small, LCX 50p/d ISR, OM1 70ost, 30 ISR, VG->OM1 50ost, 42m LIMA->LAD 99p/d - atretic, RCA dom, nl; f.cath 10/16: 40-50%(FFR 0.90) pLAD, 75% (FFR 0.77) mLAD s/p PCI/DES, oRCA 40% (FFR0.95)  . Celiac disease   . Chronic diastolic CHF (congestive heart failure) ([place]    a. 06/2009 Echo: EF 60-65%, Gr 1 DD, triv AI, mildly dil LA, nl RV.  .Marland KitchenCOPD (chronic obstructive pulmonary disease) ([place]    a. Chronic bronchitis and emphysema.  .Marland KitchenDysrhythmia   . Essential hypertension   . History of  tobacco abuse    a. Quit 2014.  .Marland KitchenPSVT (paroxysmal supraventricular tachycardia) ([place]    a. 10/2012 Noted on Zio Patch.  . Type II diabetes mellitus ([facility]     Patient Active Problem List   Diagnosis Date Noted  . Bilateral flank pain 03/24/2017  . Dyspnea 04/03/2016  . Hypotension 04/03/2016  . Chronic kidney disease 04/03/2016  . Anemia 04/03/2016  . Paroxysmal atrial fibrillation ([place] 12/23/2015  . OSA (obstructive sleep apnea) 12/10/2015  . Left inguinal hernia 11/07/2015  . Anxiety 11/07/2015  . Unstable angina ([place] 10/05/2015  . PAF (paroxysmal atrial fibrillation) ([place] 10/05/2015  . Back pain with left-sided radiculopathy 09/30/2015  . Nocturnal hypoxia 09/06/2015  . BPH (benign prostatic hyperplasia) 08/01/2015  . Chronic diastolic CHF (congestive heart failure) ([place]   . Angina pectoris ([place]   . Chest pain 07/11/2015  . COPD (chronic obstructive pulmonary disease) ([place] 07/03/2015  . CAD (coronary artery disease) 06/26/2015  . HTN (hypertension) 06/26/2015  . DM (diabetes mellitus) ([place] 06/26/2015  . Achalasia 07/24/2014  . GERD (gastroesophageal reflux disease) 06/07/2014  . Former tobacco use 04/11/2013  . HLD (hyperlipidemia) 04/09/2013    Past Surgical History:  Procedure Laterality Date  . BYPASS GRAFT    . CARDIAC CATHETERIZATION N/A 07/12/2015   rocedure: Left Heart Cath and Cors/Grafts Angiography;  Surgeon: HBelva Crome MD;  [facility];  Service: Cardiovascular;  Laterality: N/A;  . CARDIAC CATHETERIZATION Right 10/07/2015   Procedure: Left Heart Cath and Cors/Grafts Angiography;  Surgeon: Dionisio David, MD;  Location: [place] [facility];  Service: Cardiovascular;  Laterality: Right;  . CARDIAC CATHETERIZATION N/A 04/06/2016   Procedure: Left Heart Cath and Coronary Angiography;  Surgeon: Yolonda Kida, MD;  Location: [place] [facility];  Service: Cardiovascular;  Laterality: N/A;  . CARDIAC CATHETERIZATION  04/06/2016    Procedure: Bypass Graft Angiography;  Surgeon: Yolonda Kida, MD;  Location: [place] [facility];  Service: Cardiovascular;;  . CARDIAC CATHETERIZATION N/A 11/02/2016   Procedure: Left Heart Cath and Cors/Grafts Angiography and possible PCI;  Surgeon: Yolonda Kida, MD;  Location: [place] [facility];  Service: Cardiovascular;  Laterality: N/A;  . CARDIAC CATHETERIZATION N/A 11/02/2016   Procedure: Coronary Stent Intervention;  Surgeon: Yolonda Kida, MD;  Location: [place] [facility];  Service: Cardiovascular;  Laterality: N/A;  . CHOLECYSTECTOMY    . ESOPHAGEAL DILATION    . TONSILLECTOMY    . VASCULAR SURGERY      Prior to Admission medications   Medication Sig Start Date End Date Taking? Authorizing Provider  albuterol (PROVENTIL HFA;VENTOLIN HFA) 108 (90 Base) MCG/ACT inhaler Inhale 4-6 puffs into the lungs every 4 (four) hours as needed for wheezing or shortness of breath. Reported on 04/08/2016   Yes [provider]  allopurinol (ZYLOPRIM) 100 MG tablet TAKE 3 TABLETS BY MOUTH EVERY DAY. 03/22/17  Yes Birdie Sons, MD  ALPRAZolam Duanne Moron) 1 MG tablet TAKE ONE-HALF TO ONE TABLET BY MOUTH THREE TIMES DAILY AS NEEDED FOR ANXIETY 01/11/17  Yes Birdie Sons, MD  apixaban (ELIQUIS) 5 MG TABS tablet Take 5 mg by mouth 2 (two) times daily.   Yes [provider]  atorvastatin (LIPITOR) 80 MG tablet TAKE 1 TABLET BY MOUTH EVERY NIGHT AT BEDTIME. 05/26/16  Yes Birdie Sons, MD  cetirizine (ZYRTEC) 10 MG tablet TAKE ONE TABLET AT BEDTIME 04/19/16  Yes Birdie Sons, MD  clopidogrel (PLAVIX) 75 MG tablet TAKE 1 TABLET BY MOUTH DAILY WITH BREAKFAST 01/25/17  Yes Birdie Sons, MD  cyclobenzaprine (FLEXERIL) 5 MG tablet Take 1-2 tablets (5-10 mg total) by mouth 3 (three) times daily as needed (back pain). 03/26/17  Yes Birdie Sons, MD  docusate sodium (COLACE) 100 MG capsule Take 100 mg by mouth 2 (two) times daily.   Yes [provider]    empagliflozin (JARDIANCE) 10 MG TABS tablet Take 10 mg by mouth daily. 03/05/17  Yes Birdie Sons, MD  fluticasone furoate-vilanterol (BREO ELLIPTA) 100-25 MCG/INH AEPB Inhale 1 puff into the lungs daily.    Yes [provider]  lansoprazole (PREVACID) 30 MG capsule Take 30 mg by mouth 2 (two) times daily.    Yes [provider]  lisinopril (PRINIVIL,ZESTRIL) 2.5 MG tablet Take 1 tablet (2.5 mg total) by mouth daily. 02/19/17  Yes Darylene Price A, FNP  Magnesium Oxide 400 (240 Mg) MG TABS TAKE ONE TABLET BY MOUTH DAILY 03/23/17  Yes Birdie Sons, MD  metFORMIN (GLUCOPHAGE) 500 MG tablet TAKE 1 TABLET BY MOUTH EVERY MORNING. 10/05/16  Yes Birdie Sons, MD  metolazone (ZAROXOLYN) 5 MG tablet Take 1 tablet (5 mg total) by mouth daily. 03/05/17 06/03/17 Yes Hackney, Aura Fey, FNP  nitroGLYCERIN (NITROSTAT) 0.4 MG SL tablet Place 1 tablet (0.4 mg total) under the tongue every 5 (five) minutes as needed for chest pain. Reported  on 05/26/2016 10/06/16  Yes Birdie Sons, MD  omega-3 acid ethyl esters (LOVAZA) 1 g capsule TAKE FOUR CAPSULES BY MOUTH DAILY 07/25/16  Yes Birdie Sons, MD  oxyCODONE-acetaminophen (PERCOCET) 10-325 MG tablet Take 1 tablet by mouth every 4 (four) hours as needed. 04/22/17  Yes Birdie Sons, MD  potassium chloride SA (K-DUR,KLOR-CON) 20 MEQ tablet Take 1 tablet (20 mEq total) by mouth daily. And an additional 67mq in the afternoon when taking the metolazone 03/05/17 06/03/17 Yes Hackney, Tina A, FNP  RANEXA 500 MG 12 hr tablet Take 1,000 mg by mouth 2 (two) times daily.   Yes [provider]  sotalol (BETAPACE) 120 MG tablet Take 120 mg by mouth 2 (two) times daily.    Yes [provider]  SPIRIVA HANDIHALER 18 MCG inhalation capsule INHALE 1 CAPSULE VIA INHALER ONCE A DAY 01/25/17  Yes FBirdie Sons MD  tamsulosin (FLOMAX) 0.4 MG CAPS capsule Take 0.4 mg by mouth daily after breakfast.    Yes [provider]  torsemide  (DEMADEX) 100 MG tablet Take 50 mg by mouth daily.    Yes [provider]  famotidine (PEPCID) 40 MG tablet Take 1 tablet (40 mg total) by mouth every evening. 05/03/17 05/03/18  WLoney Hering MD  glucose blood (ACCU-CHEK AVIVA PLUS) test strip Use to check blood sugar once a day 11/26/16   FBirdie Sons MD  isosorbide mononitrate (IMDUR) 60 MG 24 hr tablet Take 1 tablet (60 mg total) by mouth daily. Patient taking differently: Take 60 mg by mouth 2 (two) times daily.  11/04/16   SHenreitta Leber MD  sucralfate (CARAFATE) 1 g tablet Take 1 tablet (1 g total) by mouth 2 (two) times daily. 05/03/17   WLoney Hering MD    Allergies Demerol [meperidine]; Prednisone; Sulfa antibiotics; Albuterol sulfate [albuterol]; and Morphine sulfate  Family History  Problem Relation Age of Onset  . Heart attack Mother   . Depression Mother   . Heart disease Mother   . COPD Mother   . Hypertension Mother   . Heart attack Father   . Diabetes Father   . Depression Father   . Heart disease Father   . Cirrhosis Father   . Parkinson's disease Brother     Social History Social History  Substance Use Topics  . Smoking status: Former Smoker    Quit date: 04/22/2013  . Smokeless tobacco: Never Used  . Alcohol use No     Comment: remotely quit alcohol use. Hx of heavy alcohol use.    Review of Systems  Constitutional: No fever/chills Eyes: No visual changes. ENT: No sore throat. Cardiovascular: Denies chest pain. Respiratory:  shortness of breath. Gastrointestinal: abdominal pain, nausea, no vomiting.  No diarrhea.  No constipation. Genitourinary: Negative for dysuria. Musculoskeletal: Negative for back pain. Skin: Negative for rash. Neurological: Negative for headaches, focal weakness or numbness.   ____________________________________________   PHYSICAL EXAM:  VITAL SIGNS: ED Triage Vitals  Enc Vitals Group     BP 05/02/17 2121 122/76     Pulse Rate 05/02/17 2121 74      Resp 05/02/17 2121 16     Temp 05/02/17 2121 97.6 F (36.4 C)     Temp Source 05/02/17 2121 Oral     SpO2 05/02/17 2121 97 %     Weight --      Height --      Head Circumference --      Peak Flow --  Pain Score 05/02/17 2122 9     Pain Loc --      Pain Edu? --      Excl. in [place]? --     Constitutional: Alert and oriented. Well appearing and in moderate distress. Eyes: Conjunctivae are normal. PERRL. EOMI. Head: Atraumatic. Nose: No congestion/rhinnorhea. Mouth/Throat: Mucous membranes are moist.  Oropharynx non-erythematous. Cardiovascular: Normal rate, regular rhythm. Grossly normal heart sounds.  Good peripheral circulation. Respiratory: Normal respiratory effort.  No retractions. Lungs CTAB. Gastrointestinal: Soft with some upper abd pain. No distention. positive bowel sounds Musculoskeletal: No lower extremity tenderness nor edema.   Neurologic:  Normal speech and language.  Skin:  Skin is warm, dry and intact.  Psychiatric: Mood and affect are normal.  ____________________________________________   LABS (all labs ordered are listed, but only abnormal results are displayed)  Labs Reviewed  CBC - Abnormal; Notable for the following:       Result Value   RDW 15.3 (*)    All other components within normal limits  COMPREHENSIVE METABOLIC PANEL - Abnormal; Notable for the following:    Chloride 97 (*)    Glucose, Bld 167 (*)    Creatinine, Ser 1.36 (*)    ALT 14 (*)    GFR calc non Af Amer 54 (*)    All other components within normal limits  LIPASE, BLOOD  TROPONIN I   ____________________________________________  EKG  ED ECG REPORT I, [name], the attending physician, personally viewed and interpreted this ECG.   Date: 05/03/2017  EKG Time: 137  Rate: 53  Rhythm: sinus bradycardia  Axis: normal  Intervals:prolonged qTC  ST&T Change: none  ____________________________________________  RADIOLOGY  Ct Abdomen Pelvis W  Contrast  Result Date: 05/03/2017 CLINICAL DATA:  Upper abdominal pain. EXAM: CT ABDOMEN AND PELVIS WITH CONTRAST TECHNIQUE: Multidetector CT imaging of the abdomen and pelvis was performed using the standard protocol following bolus administration of intravenous contrast. CONTRAST:  171m ISOVUE-300 IOPAMIDOL (ISOVUE-300) INJECTION 61% COMPARISON:  Multiple prior exams most recent CT 06/24/2016 FINDINGS: Lower chest: Chronic basilar atelectasis or scarring, left greater than right. No consolidation. No pleural fluid. Hepatobiliary: Decreased hepatic density consistent with steatosis. No focal lesion. Clips in the gallbladder fossa postcholecystectomy. No biliary dilatation. Pancreas: No ductal dilatation or inflammation. Spleen: Normal in size without focal abnormality. Adrenals/Urinary Tract: 13 mm right adrenal nodule is stable. 14 mm left adrenal nodule is stable. No hydronephrosis or perinephric edema. Homogeneous renal enhancement is symmetric excretion on delayed phase imaging. Urinary bladder is physiologically distended without wall thickening. No evidence of urolithiasis. Stomach/Bowel: Mild distal esophageal thickening and enteric contrast in the distal esophagus, small hiatal hernia. Stomach physiologically distended with enteric contrast. Small duodenal diverticulum again seen. No bowel obstruction or inflammation, enteric contrast reaches cecum. Moderate stool burden throughout the colon. Minimal diverticulosis of the sigmoid colon without diverticulitis. Normal appendix. Vascular/Lymphatic: Aortic and branch atherosclerosis without aneurysm. No adenopathy. Reproductive: Prostate is unchanged in size. Other: Tiny fat containing umbilical hernia. Minimal fat the left inguinal canal. No free air, free fluid, or intra-abdominal fluid collection. Musculoskeletal: There are no acute or suspicious osseous abnormalities. IMPRESSION: 1. Mild distal esophageal wall thickening and small hiatal hernia with  enteric contrast in the distal esophagus, suggesting reflux or delayed transit. 2. No additional acute abnormality in the abdomen/pelvis. 3. Chronic findings of hepatic steatosis and sigmoid colonic diverticulosis. 4. Small fat containing umbilical and left inguinal hernias. 5. Aortic atherosclerosis without aneurysm. Electronically Signed   By:  Jeb Levering M.D.   On: 05/03/2017 02:45    ____________________________________________   PROCEDURES  Procedure(s) performed: None  Procedures  Critical Care performed: No  ____________________________________________   INITIAL IMPRESSION / ASSESSMENT AND PLAN / ED COURSE  Pertinent labs & imaging results that were available during my care of the patient were reviewed by me and considered in my medical decision making (see chart for details).  This is a 61 year old male who comes into the hospital today with some abdominal pain. The patient is concerned that he may have a hernia. I did send the patient for a CT scan which showed some distal esophageal thickening with some reflux of the contrast back into his esophagus. It appears that the patient has some esophagitis. I gave him a GI cocktail as well as some Carafate and his pain symptoms improved. The patient's blood work is unremarkable. I will discharge the patient home to have him follow-up with his primary care physician. I did give the patient a dose of magnesium sulfate for his prolonged QTC. The patient has no further questions or concerns. He'll be discharged home.      ____________________________________________   FINAL CLINICAL IMPRESSION(S) / ED DIAGNOSES  Final diagnoses:  Left upper quadrant pain  Esophagitis  Acute gastritis without hemorrhage, unspecified gastritis type      NEW MEDICATIONS STARTED DURING THIS VISIT:  New Prescriptions   FAMOTIDINE (PEPCID) 40 MG TABLET    Take 1 tablet (40 mg total) by mouth every evening.   SUCRALFATE (CARAFATE) 1 G TABLET     Take 1 tablet (1 g total) by mouth 2 (two) times daily.     Note:  This document was prepared using Dragon voice recognition software and may include unintentional dictation errors.    [name], MD 05/03/17 0500

## 2017-05-03 NOTE — ED Notes (Addendum)
Pt reports that his pain started on Saturday, pt reports hx of pancreatitis and having a Nissen procedure performed. Pt states most recent procedure was a cardiac stent placed in December, pt denies any diff passing stool. Pt reports pain somewhat reminds him of his pancreatitis in the past. Pt is tender in the left mid and lower quad. Pt is somewhat guarded upon assessment and jumps with light touch to the area. Pt denies ever having a hernia in the past, pt states that he feels like there is a "knot" where his pain is, no lump noted with assessment

## 2017-05-06 IMAGING — CR DG CHEST 2V
1 series · 2 of 2 positions shown · non-contrast
Comparison: June 16, 2015

CLINICAL DATA: Shortness of Breath

EXAM:
CHEST  2 VIEW

[Series 1: w chest pa · 0.14mm/px · 2 of 2 slices shown]
[im 1/2]
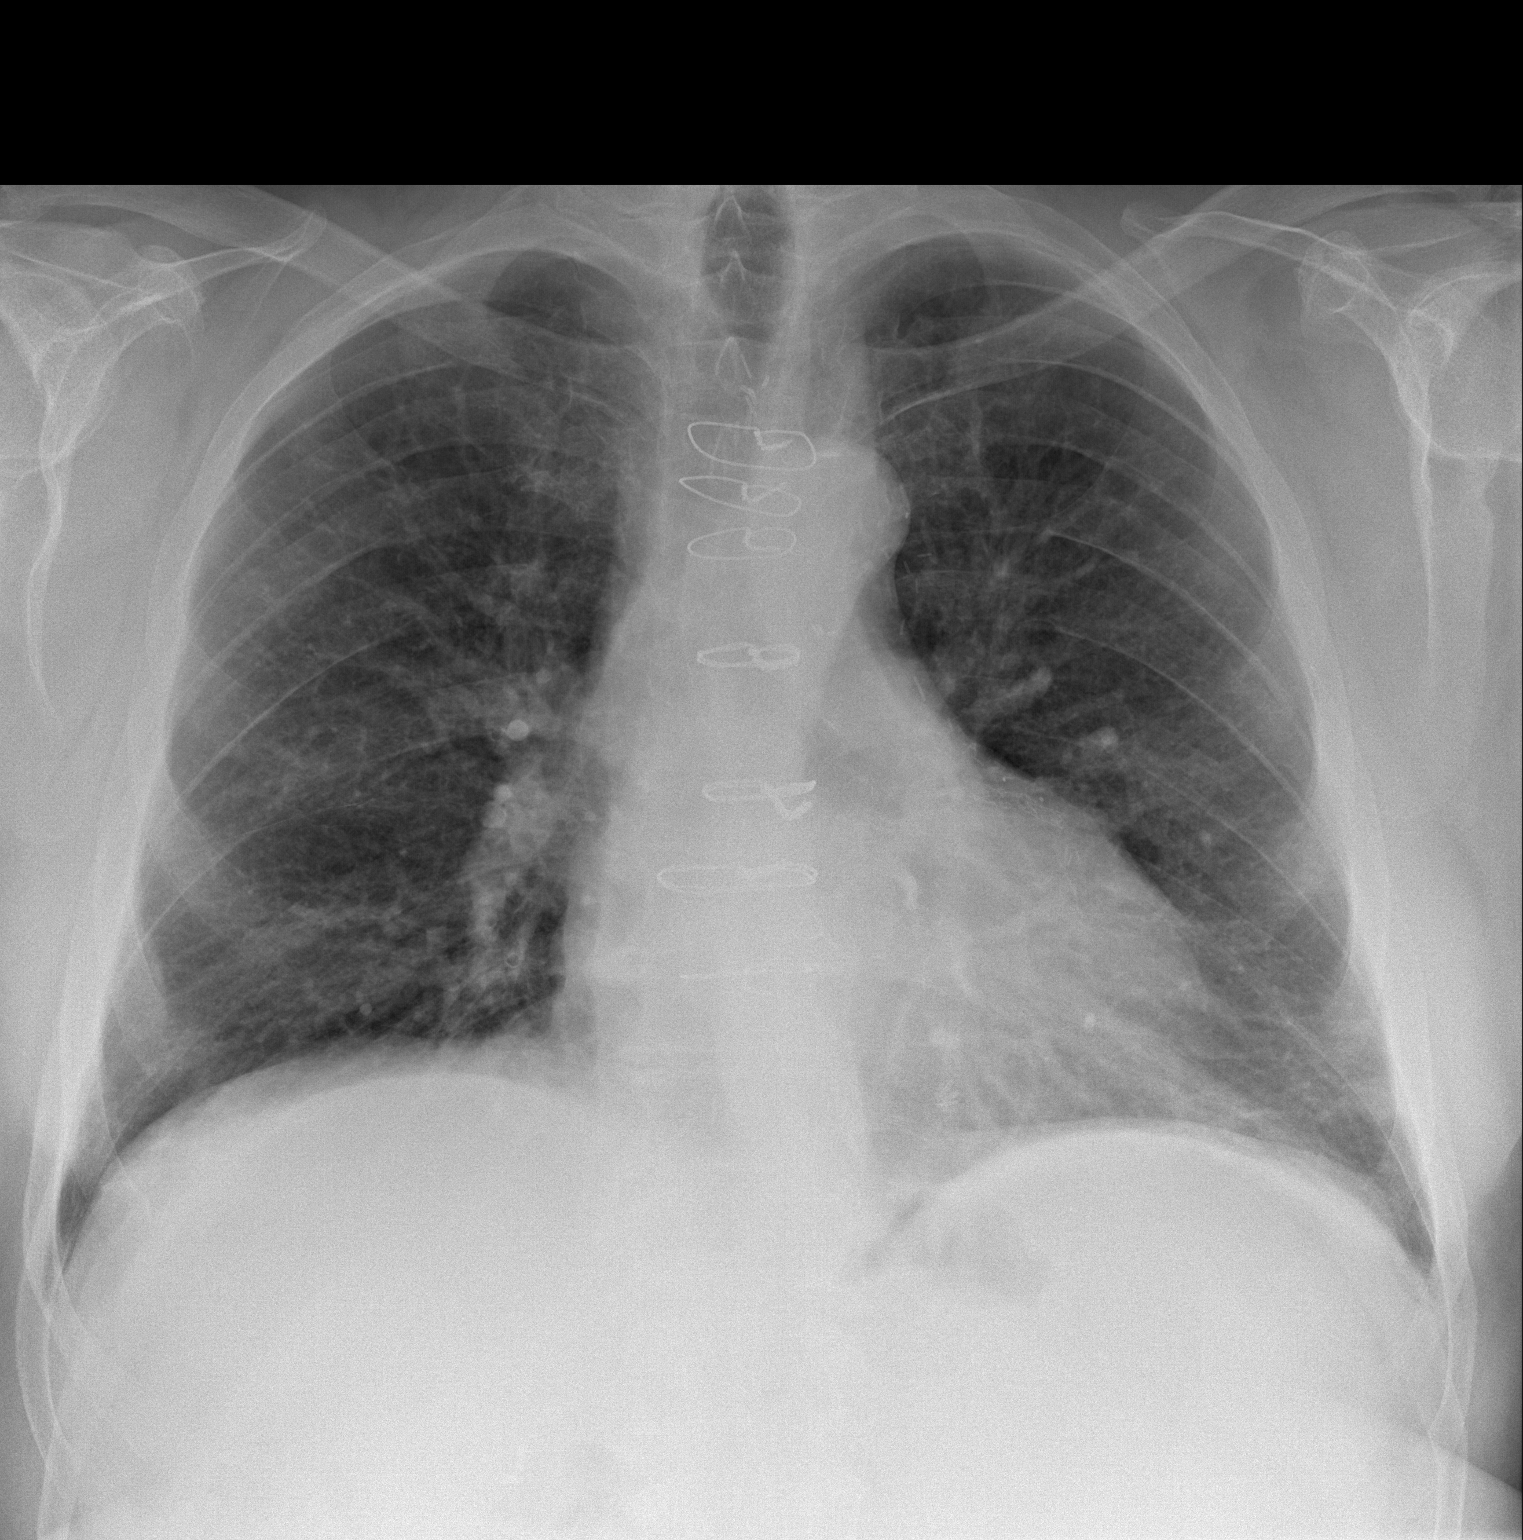
[im 2/2]
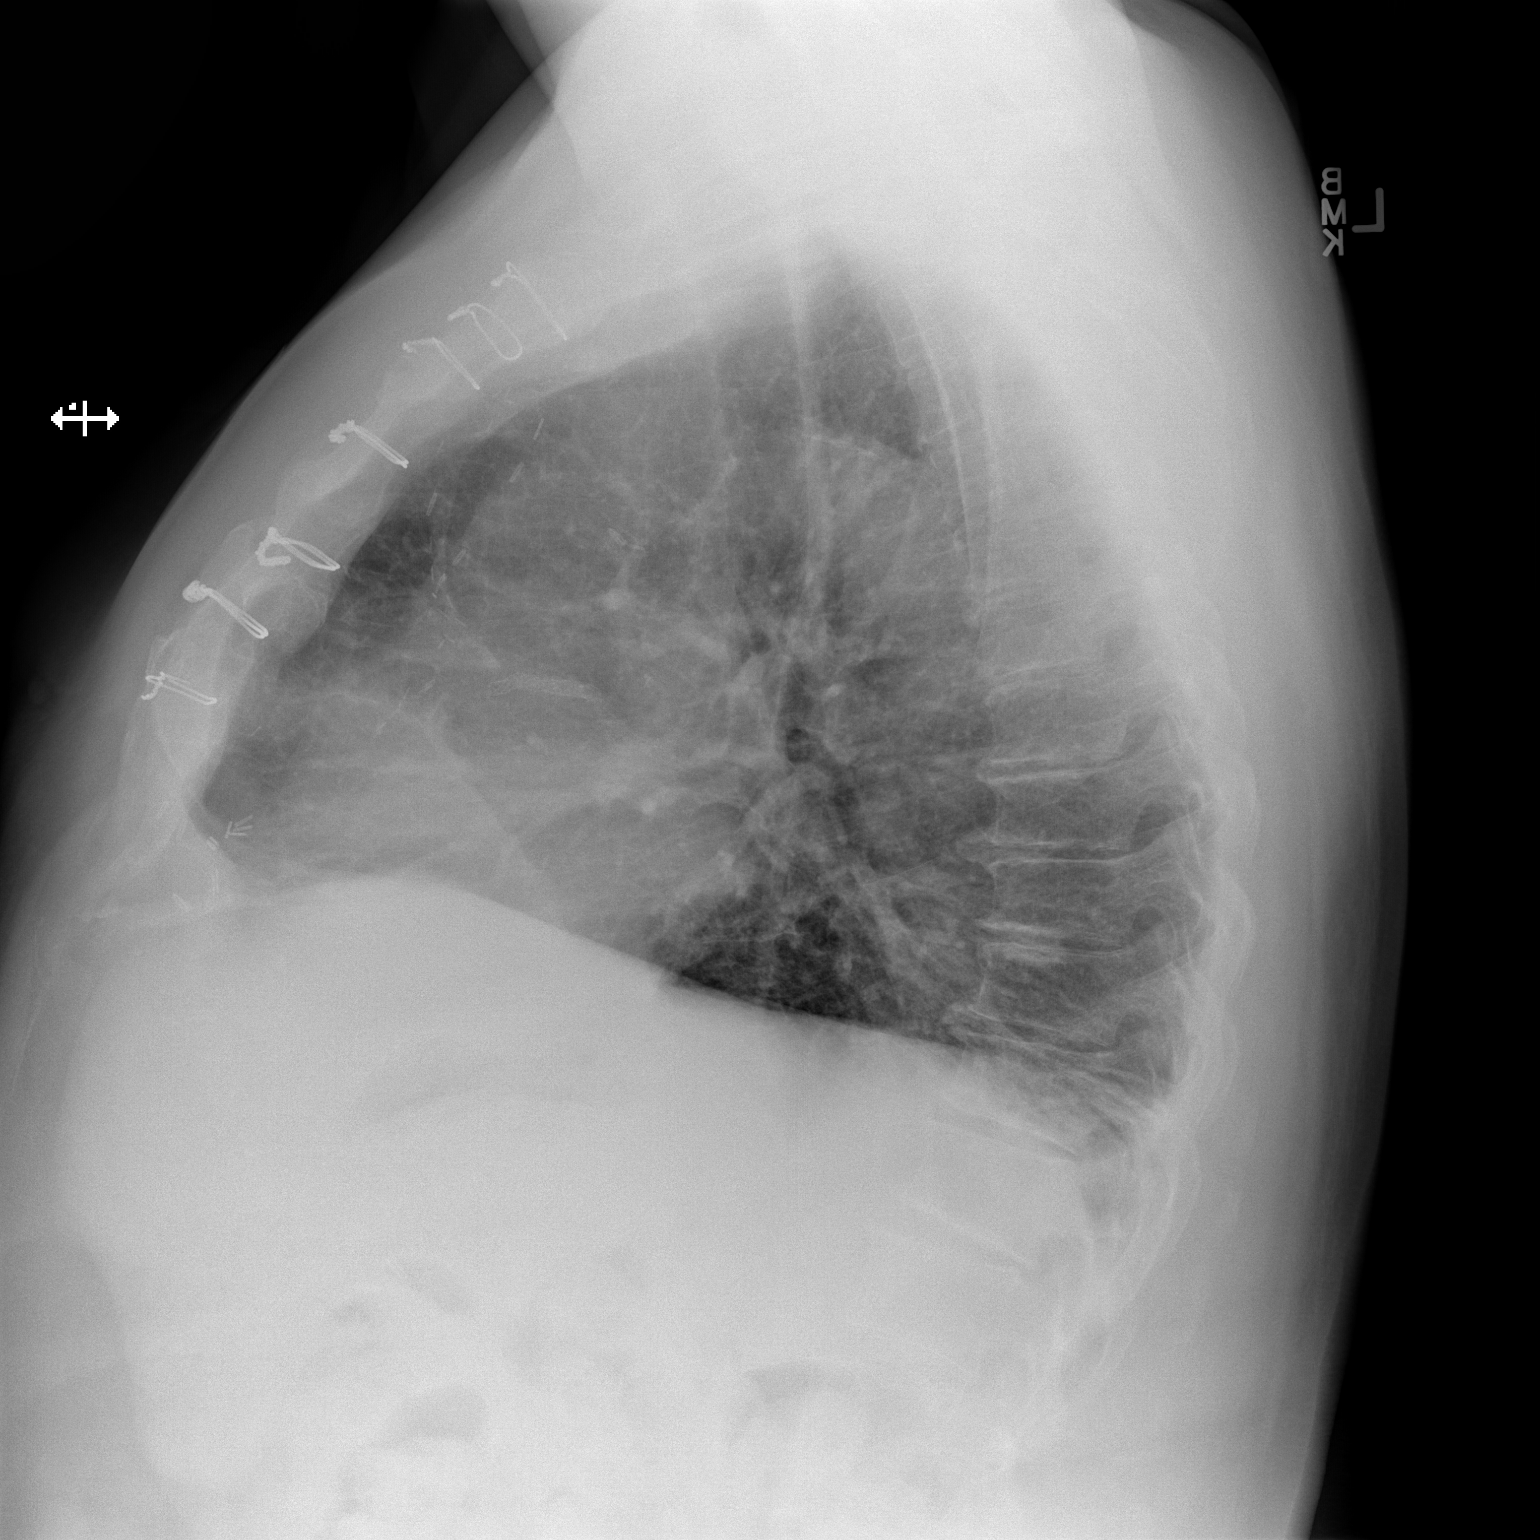

[2 of 2 positions shown; findings below may reference images not displayed]

FINDINGS: There is mild atelectasis in the left base. There is no edema or
consolidation. Heart is upper normal in size with pulmonary
vascularity within normal limits. Patient is status post internal
mammary bypass grafting. No adenopathy. There is degenerative change
in the thoracic spine. There is mild anterior wedging of several
lower thoracic vertebral bodies, stable.
IMPRESSION: Mild left base atelectasis. No edema or consolidation. No change in
cardiac silhouette.

## 2017-05-11 IMAGING — CR DG CHEST 2V
1 series · 2 of 2 positions shown · non-contrast
Comparison: Two-view chest x-ray 04/25/2015.

CLINICAL DATA: Chest pain. Shortness of breath and cough for 1 day.

EXAM:
CHEST - 2 VIEW

[Series 1: dg chest 2 view · 0.14mm/px · 2 of 2 slices shown]
[im 1/2]
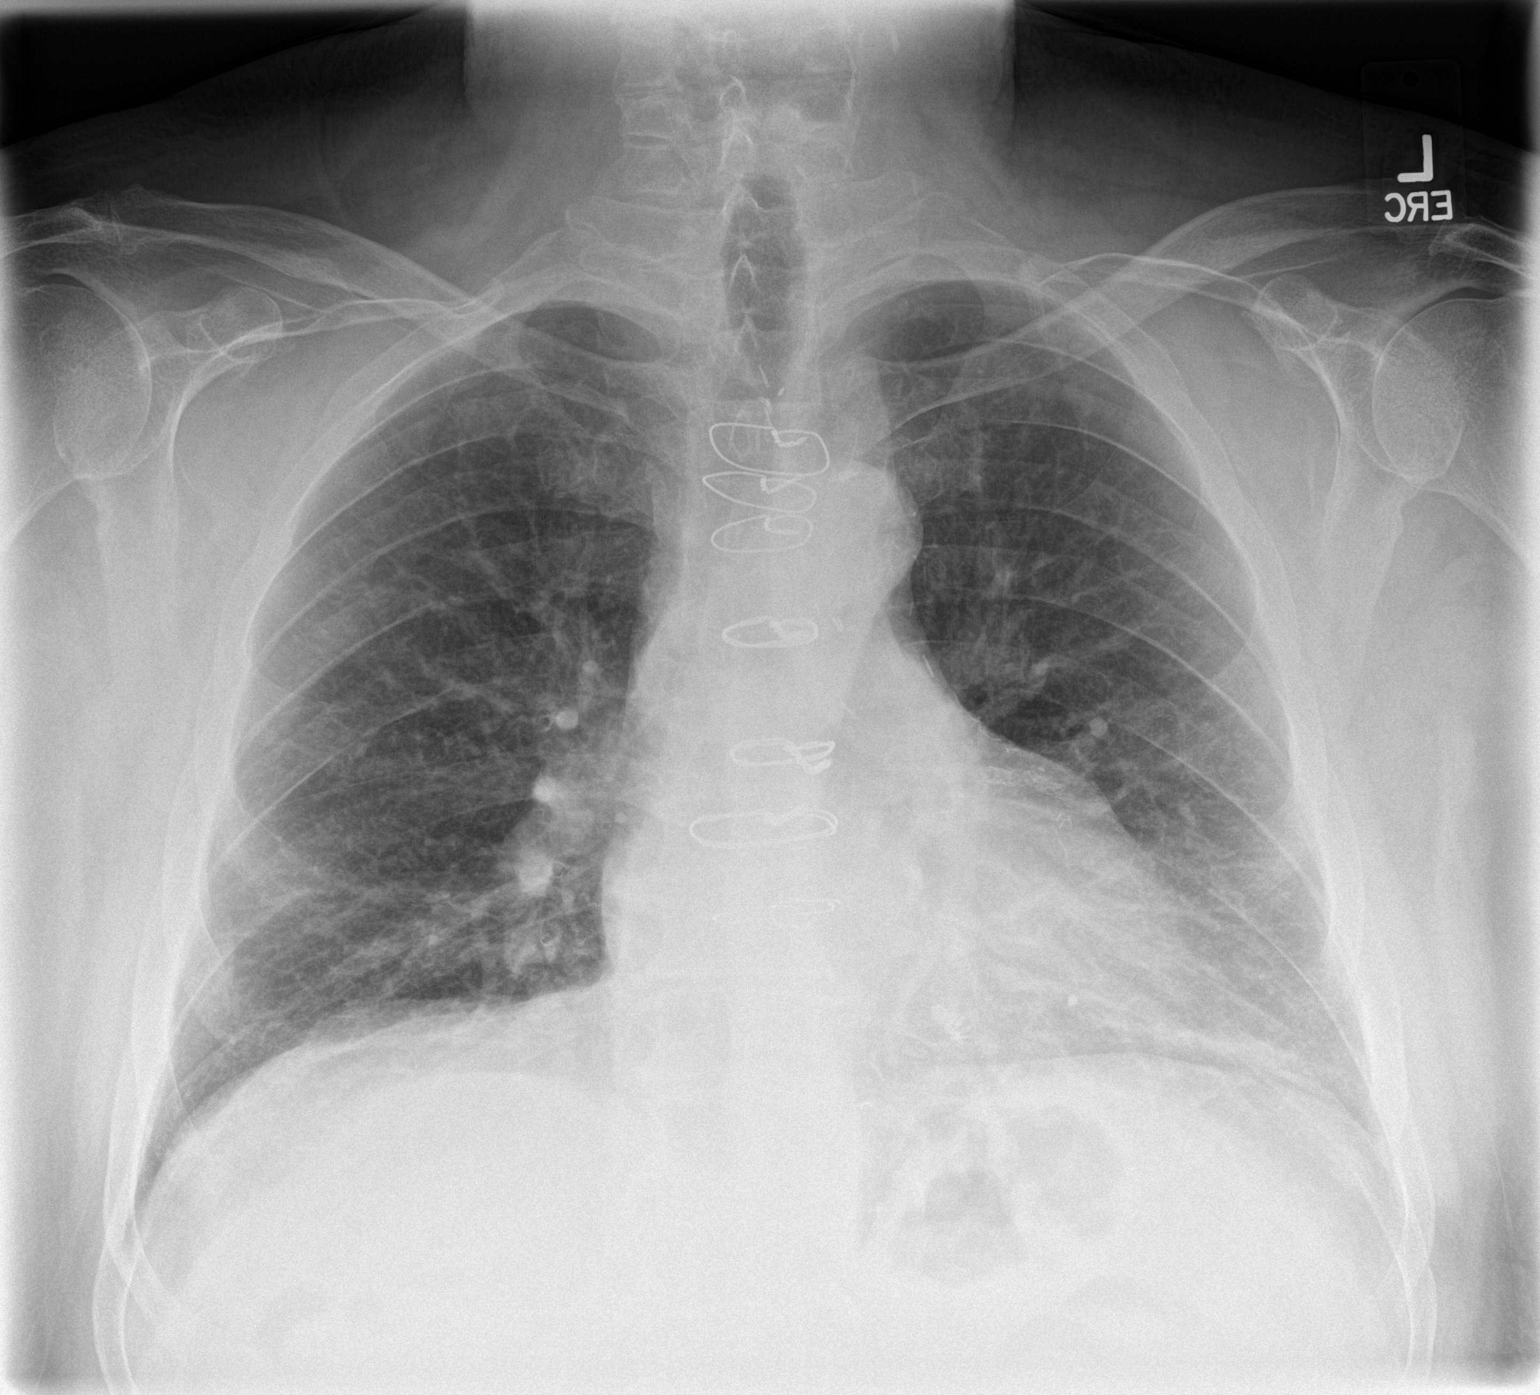
[im 2/2]
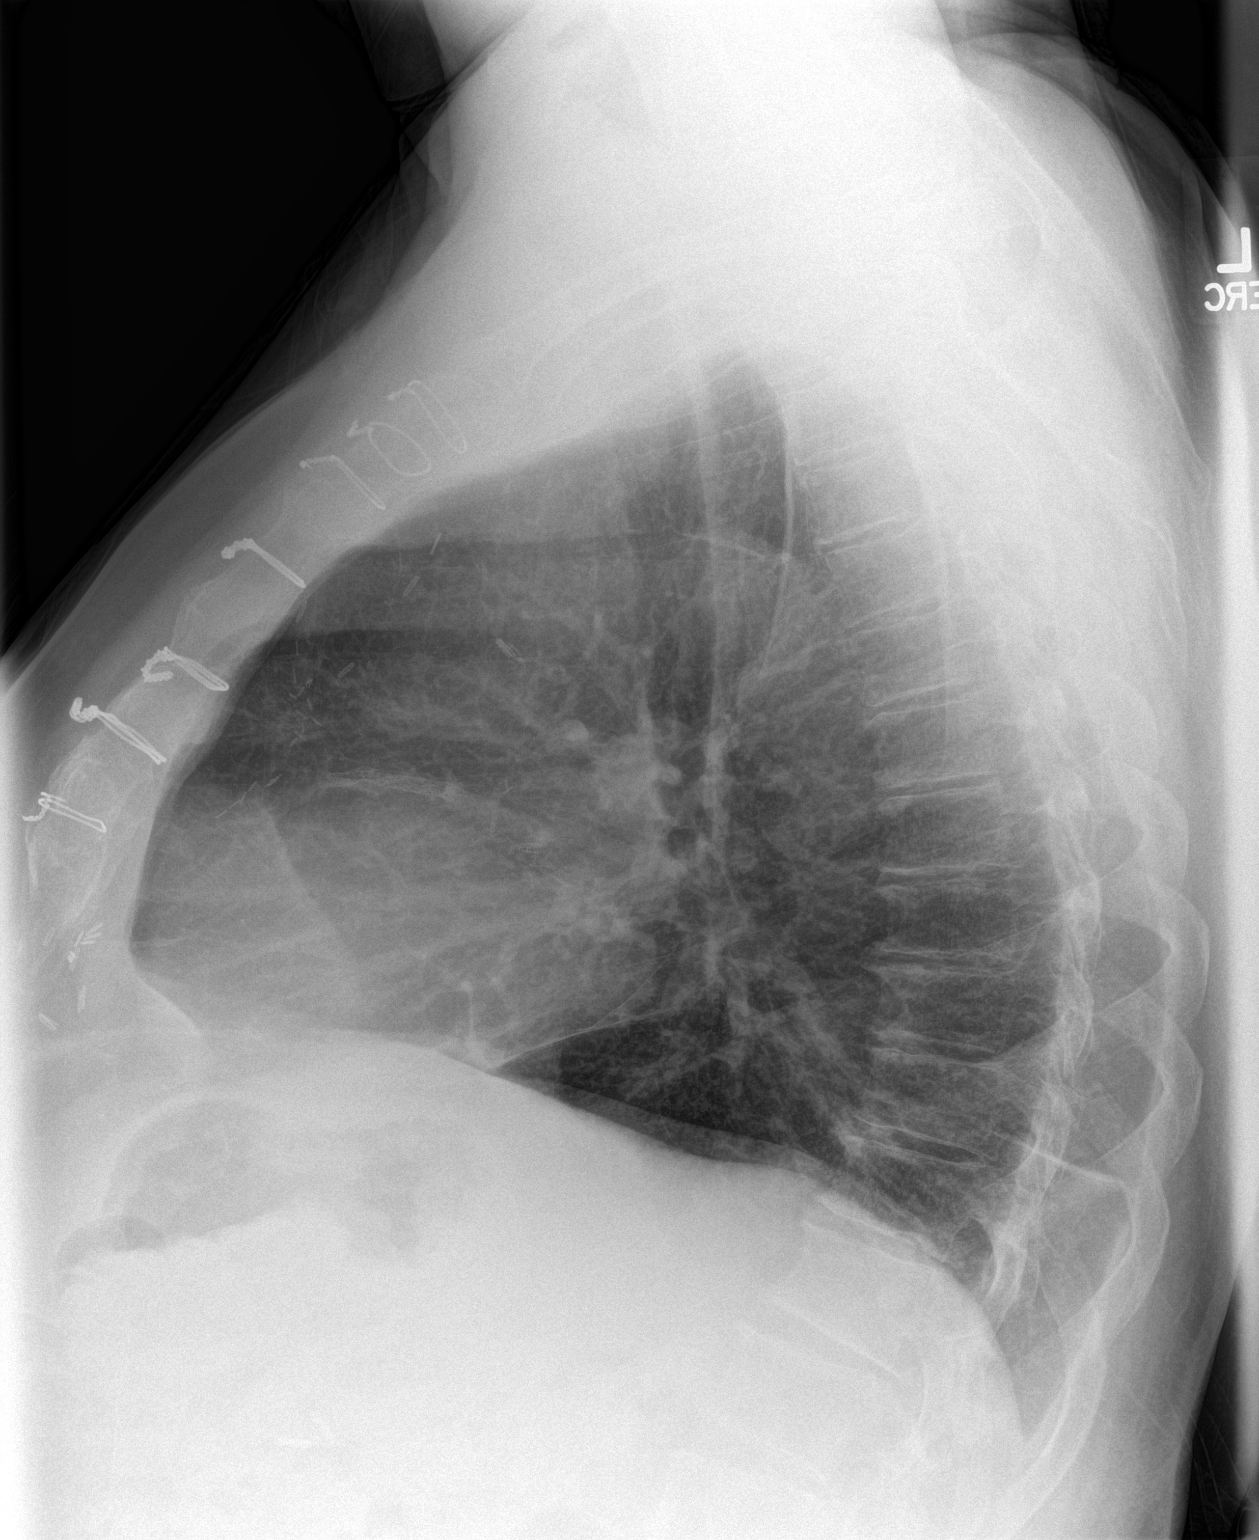

[2 of 2 positions shown; findings below may reference images not displayed]

FINDINGS: The heart size is exaggerated by low lung volumes. Median sternotomy
is noted. Mild pulmonary vascular congestion is evident. Linear
density at the left base likely reflects atelectasis. No other
significant airspace consolidation is present. The visualized soft
tissues and bony thorax are unremarkable.
IMPRESSION: 1. Mild left base atelectasis.
2. Low lung volumes and mild pulmonary vascular congestion.
3. No other acute cardiopulmonary disease.

## 2017-05-12 ENCOUNTER — Other Ambulatory Visit: Payer: Self-pay | Admitting: Family Medicine

## 2017-05-12 DIAGNOSIS — R0602 Shortness of breath: Secondary | ICD-10-CM | POA: Diagnosis not present

## 2017-05-12 DIAGNOSIS — G4710 Hypersomnia, unspecified: Secondary | ICD-10-CM | POA: Diagnosis not present

## 2017-05-12 DIAGNOSIS — J439 Emphysema, unspecified: Secondary | ICD-10-CM | POA: Diagnosis not present

## 2017-05-12 DIAGNOSIS — I10 Essential (primary) hypertension: Secondary | ICD-10-CM | POA: Diagnosis not present

## 2017-05-12 DIAGNOSIS — J449 Chronic obstructive pulmonary disease, unspecified: Secondary | ICD-10-CM | POA: Diagnosis not present

## 2017-05-12 MED ORDER — FAMOTIDINE 40 MG PO TABS: 40.0000 mg | ORAL_TABLET | Freq: Every evening | ORAL | 11 refills | Status: DC

## 2017-05-12 NOTE — Telephone Encounter (Signed)
[facility] faxed a request on the following medication.  Thanks [facility]  famotidine (PEPCID) 40 MG tablet >Take 1 tablet by mouth every evening.

## 2017-05-20 ENCOUNTER — Other Ambulatory Visit: Payer: Self-pay | Admitting: Emergency Medicine

## 2017-05-20 MED ORDER — OXYCODONE-ACETAMINOPHEN 10-325 MG PO TABS: 1.0000 | ORAL_TABLET | ORAL | 0 refills | Status: DC | PRN

## 2017-05-20 NOTE — Telephone Encounter (Signed)
Please review

## 2017-05-20 NOTE — Telephone Encounter (Signed)
Pt called requesting refill. Please advise. Thanks.

## 2017-05-22 IMAGING — CR DG CHEST 2V
2 series · 2 of 2 positions shown · non-contrast
Comparison: 06/30/2015.

CLINICAL DATA: Chest pain shortness of breath when walking.
Previously placed coronary artery stents.

EXAM:
CHEST  2 VIEW

[chest pa]
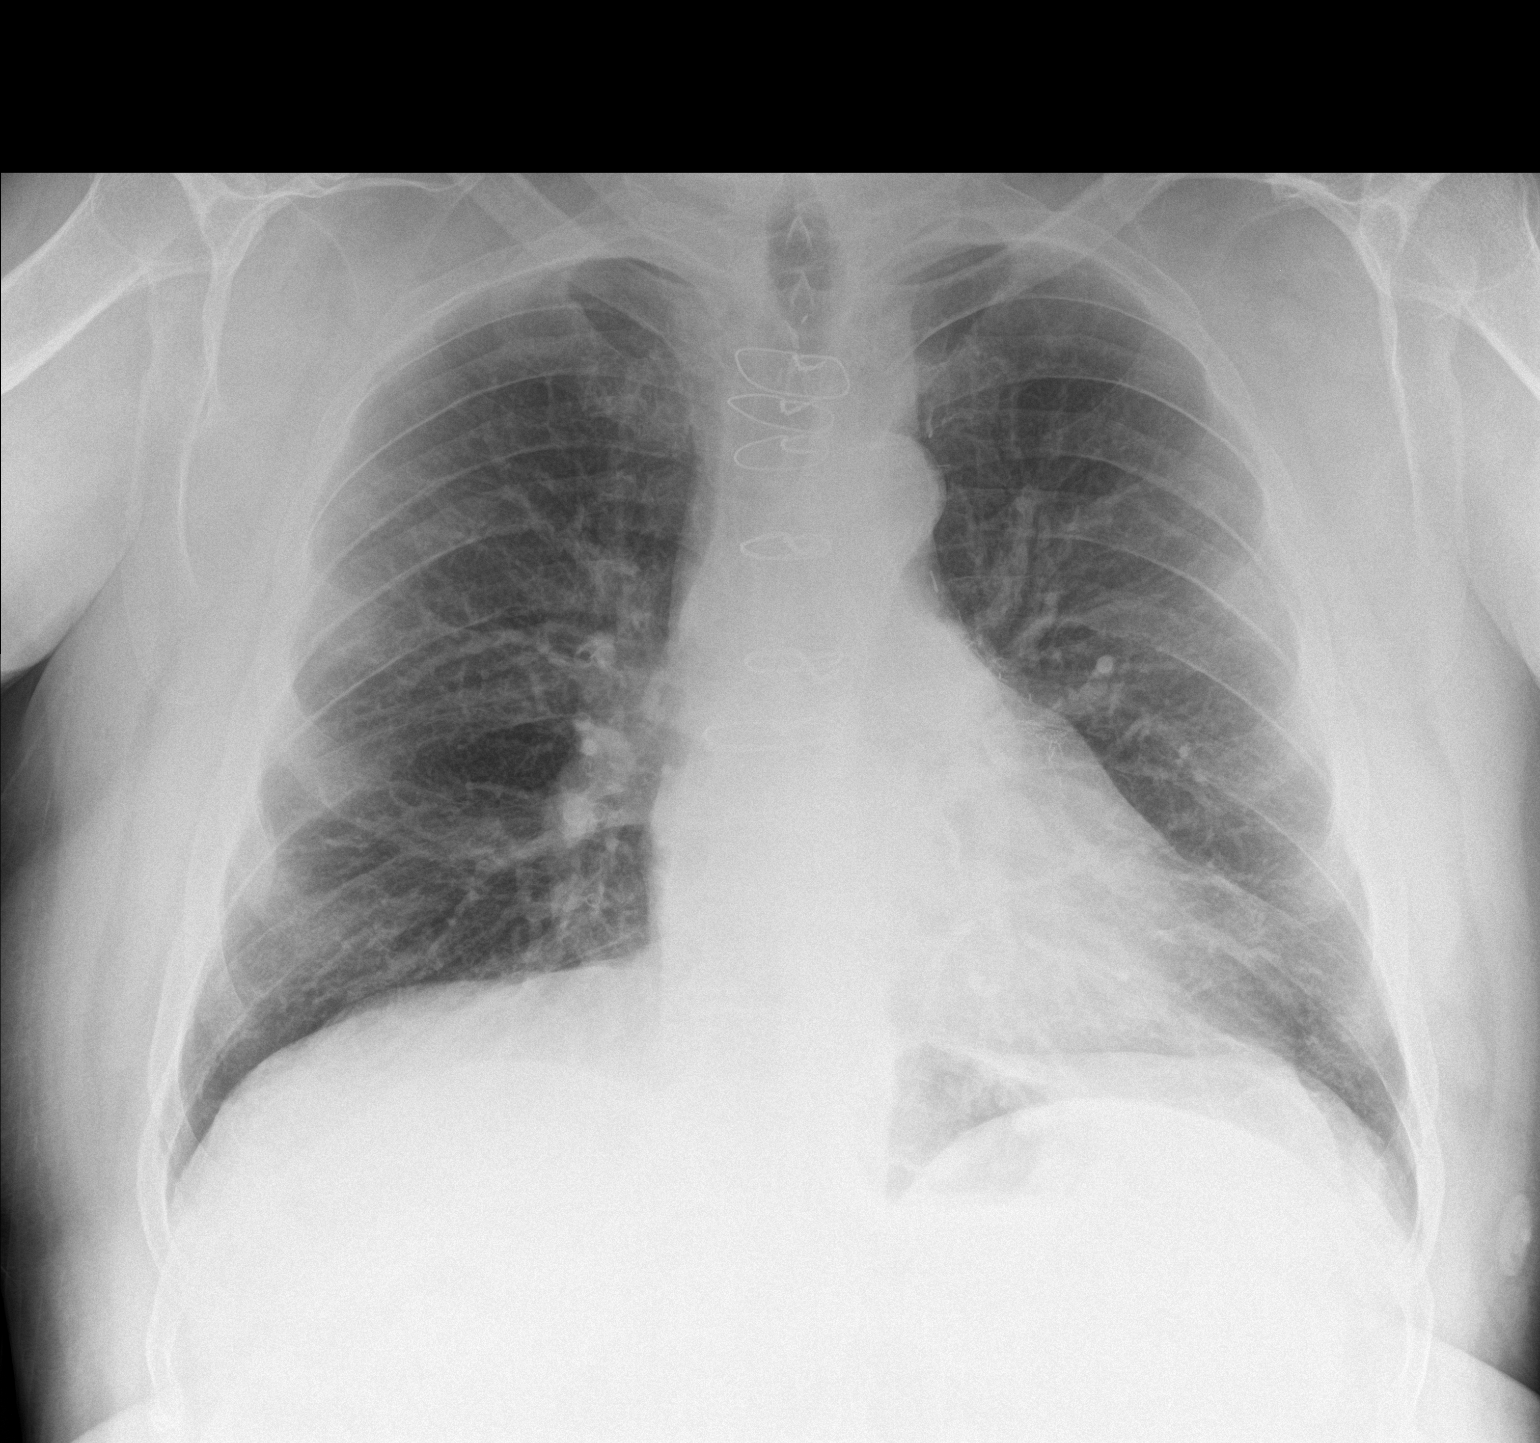

[chest lat]
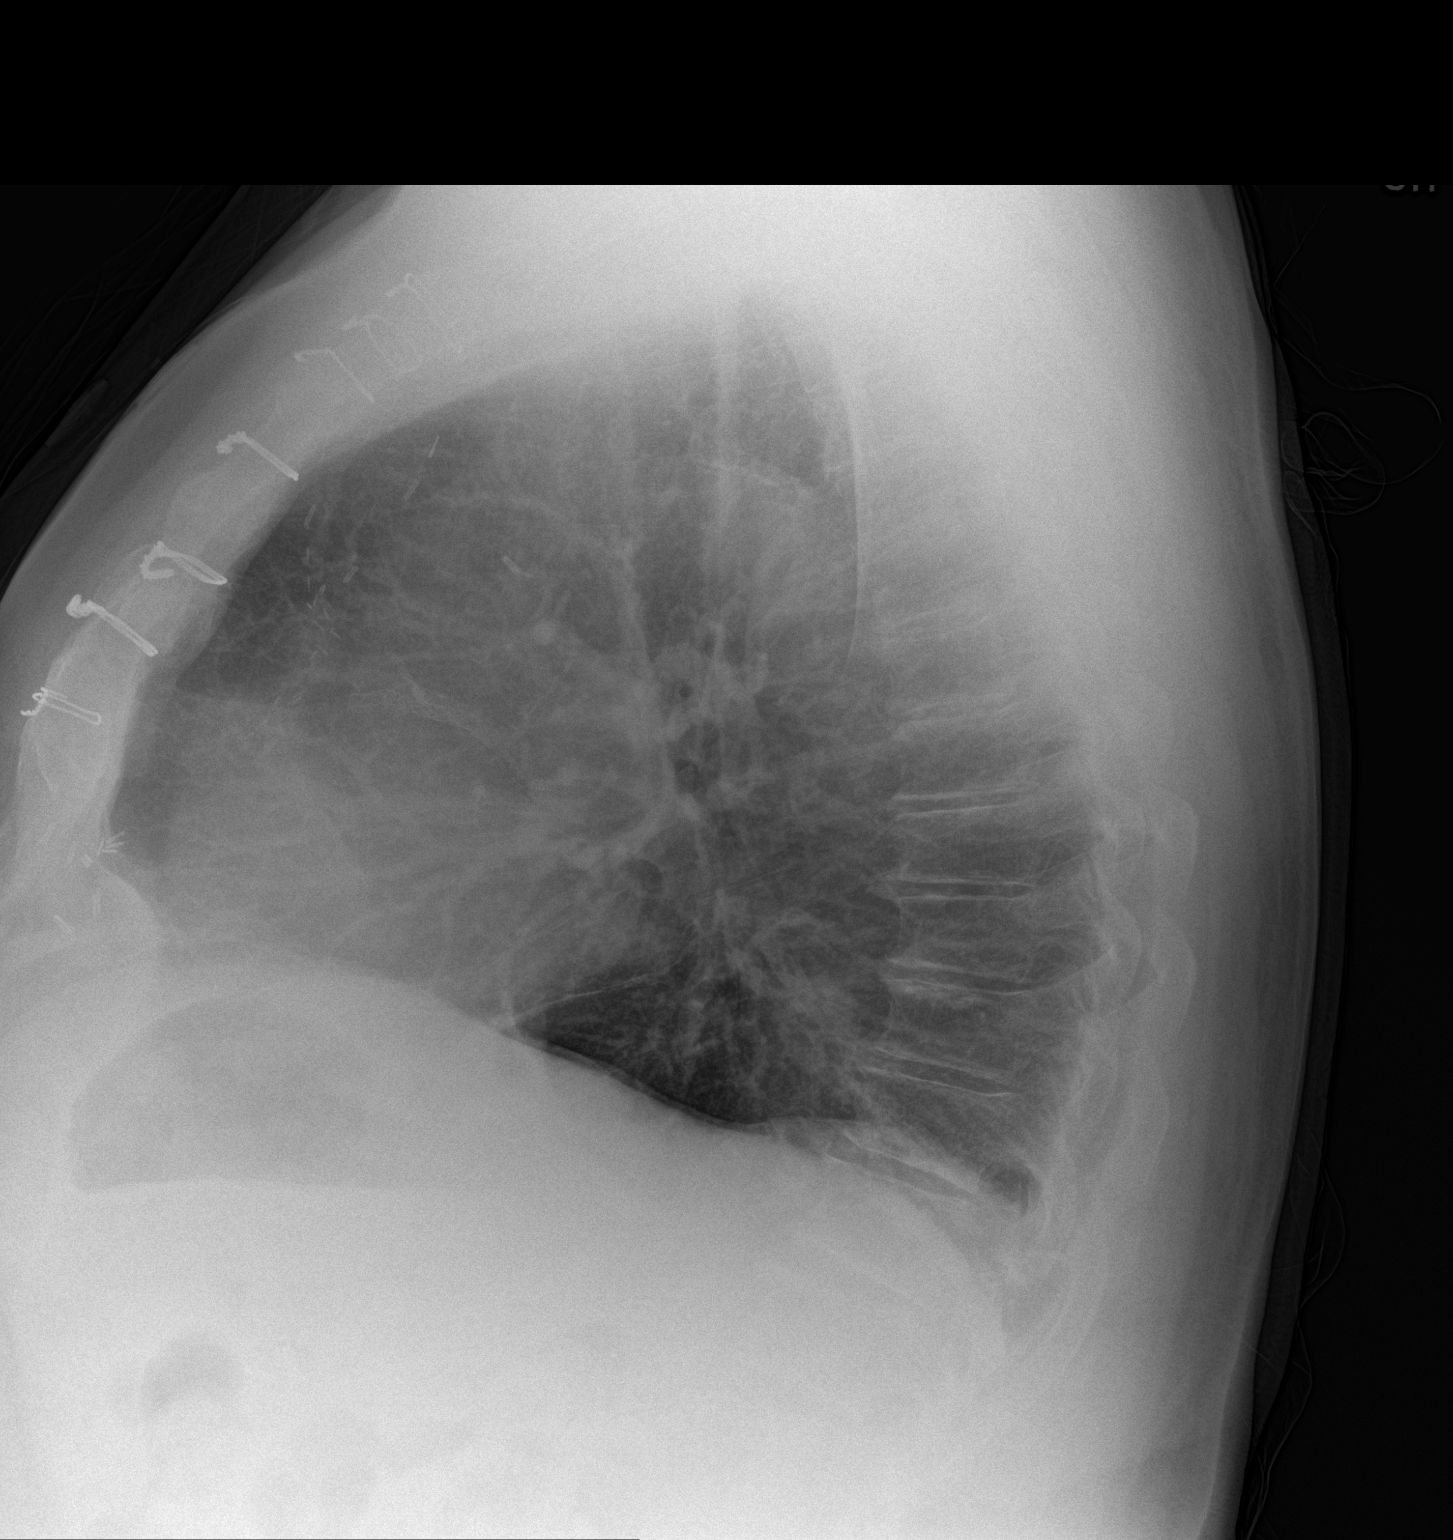

[2 of 2 positions shown; findings below may reference images not displayed]

FINDINGS: Normal sized heart. Stable mildly prominent pulmonary vasculature
and interstitial markings. Coronary artery stents are again noted as
well as post CABG changes. Diffuse osteopenia. Thoracic spine
degenerative changes.
IMPRESSION: No acute abnormality. Stable mild pulmonary vascular congestion and
mild chronic interstitial lung disease.

## 2017-05-28 ENCOUNTER — Other Ambulatory Visit: Payer: Self-pay | Admitting: Family Medicine

## 2017-05-28 ENCOUNTER — Ambulatory Visit (INDEPENDENT_AMBULATORY_CARE_PROVIDER_SITE_OTHER): Payer: Medicare HMO | Admitting: Family Medicine

## 2017-05-28 ENCOUNTER — Telehealth: Payer: Self-pay | Admitting: Family Medicine

## 2017-05-28 VITALS — BP 118/72 | HR 60 | Temp 97.4°F | Resp 18 | Wt 232.0 lb

## 2017-05-28 DIAGNOSIS — M5410 Radiculopathy, site unspecified: Secondary | ICD-10-CM | POA: Diagnosis not present

## 2017-05-28 DIAGNOSIS — F419 Anxiety disorder, unspecified: Secondary | ICD-10-CM

## 2017-05-28 DIAGNOSIS — M549 Dorsalgia, unspecified: Secondary | ICD-10-CM

## 2017-05-28 DIAGNOSIS — E1159 Type 2 diabetes mellitus with other circulatory complications: Secondary | ICD-10-CM | POA: Diagnosis not present

## 2017-05-28 LAB — POCT GLYCOSYLATED HEMOGLOBIN (HGB A1C): Hemoglobin A1C: 6.3

## 2017-05-28 MED ORDER — ALPRAZOLAM 1 MG PO TABS: ORAL_TABLET | ORAL | 3 refills | Status: DC

## 2017-05-28 MED ORDER — CYCLOBENZAPRINE HCL 5 MG PO TABS: 5.0000 mg | ORAL_TABLET | Freq: Three times a day (TID) | ORAL | 5 refills | Status: DC | PRN

## 2017-05-28 NOTE — Telephone Encounter (Signed)
Please call in alprazolam.

## 2017-05-28 NOTE — Progress Notes (Signed)
Patient: Lawrence [name] Male    DOB: 26-Aug-1956   61 y.o.   MRN: [id number] Visit Date: 05/28/2017  Today's Provider: [name], MD   Chief Complaint  Patient presents with  . Hypertension  . Diabetes  . Hyperlipidemia   Subjective:    HPI  Patient is here for 3 months follow up. Diabetes: on his last visit on 02/26/17 Vania Rea was added for the patient. Patient is checking his sugar once in the morning fasting, this morning reading was 143 but usually is around 120. No hypoglycemic episodes or symptoms. Patient states his last eye exam was 2 years ago. Lab Results  Component Value Date   HGBA1C 7.4 02/26/2017   HTN: patient is checking his b/p sometimes and readings have been around 127/90 or lower. Chronic shortness pf breath and chest tightness present. BP Readings from Last 3 Encounters:  05/28/17 118/72  05/03/17 122/84  03/26/17 132/72   Wt Readings from Last 3 Encounters:  05/28/17 232 lb (105.2 kg)  03/26/17 242 lb (109.8 kg)  03/24/17 242 lb 6 oz (109.9 kg)   Hyperlipidemia: Patient is taking Atorvastatin 80 mg daily. Tolerating it well. Lab Results  Component Value Date   CHOL 157 10/31/2016   HDL 30 (L) 10/31/2016   LDLCALC UNABLE TO CALCULATE IF TRIGLYCERIDE OVER 400 mg/dL 10/31/2016   TRIG 424 (H) 10/31/2016   CHOLHDL 5.2 10/31/2016   Patient has seen Dr Humphrey Rolls on June 27th 2018 and had pulmonary testing done. Patient has follow up to see Dr [name] on July 30th 2018.  Patient would like to get Xanax refilled he takes this 1 tablet 3 times daily.  Allergies  Allergen Reactions  . Demerol [Meperidine] Hives  . Prednisone Other (See Comments) and Hypertension    Pt states that this medication puts him in A-fib   . Sulfa Antibiotics Hives  . Albuterol Sulfate [Albuterol] Palpitations and Other (See Comments)    Pt currently uses this medication.    . Morphine Sulfate Nausea And Vomiting, Rash and Other (See Comments)    Pt states that he is  only allergic to the tablet form of this medication.       Current Outpatient Prescriptions:  .  albuterol (PROVENTIL HFA;VENTOLIN HFA) 108 (90 Base) MCG/ACT inhaler, Inhale 4-6 puffs into the lungs every 4 (four) hours as needed for wheezing or shortness of breath. Reported on 04/08/2016, Disp: , Rfl:  .  allopurinol (ZYLOPRIM) 100 MG tablet, TAKE 3 TABLETS BY MOUTH EVERY DAY., Disp: 90 tablet, Rfl: 4 .  ALPRAZolam (XANAX) 1 MG tablet, TAKE ONE-HALF TO ONE TABLET BY MOUTH THREE TIMES DAILY AS NEEDED FOR ANXIETY, Disp: 90 tablet, Rfl: 3 .  apixaban (ELIQUIS) 5 MG TABS tablet, Take 5 mg by mouth 2 (two) times daily., Disp: , Rfl:  .  atorvastatin (LIPITOR) 80 MG tablet, TAKE 1 TABLET BY MOUTH EVERY NIGHT AT BEDTIME., Disp: 90 tablet, Rfl: 4 .  cetirizine (ZYRTEC) 10 MG tablet, TAKE ONE TABLET BY MOUTH AT BEDTIME., Disp: 30 tablet, Rfl: 11 .  clopidogrel (PLAVIX) 75 MG tablet, TAKE 1 TABLET BY MOUTH DAILY WITH BREAKFAST, Disp: 30 tablet, Rfl: 12 .  docusate sodium (COLACE) 100 MG capsule, Take 100 mg by mouth daily. , Disp: , Rfl:  .  empagliflozin (JARDIANCE) 10 MG TABS tablet, Take 10 mg by mouth daily., Disp: 30 tablet, Rfl: 3 .  fluticasone furoate-vilanterol (BREO ELLIPTA) 100-25 MCG/INH AEPB, Inhale 1 puff into the lungs  daily. , Disp: , Rfl:  .  glucose blood (ACCU-CHEK AVIVA PLUS) test strip, Use to check blood sugar once a day, Disp: 100 each, Rfl: 4 .  isosorbide mononitrate (IMDUR) 60 MG 24 hr tablet, Take 1 tablet (60 mg total) by mouth daily. (Patient taking differently: Take 60 mg by mouth 2 (two) times daily. ), Disp: 60 tablet, Rfl: 1 .  lisinopril (PRINIVIL,ZESTRIL) 2.5 MG tablet, Take 1 tablet (2.5 mg total) by mouth daily., Disp: 30 tablet, Rfl: 5 .  Magnesium Oxide 400 (240 Mg) MG TABS, TAKE ONE TABLET BY MOUTH DAILY, Disp: 30 tablet, Rfl: 12 .  metFORMIN (GLUCOPHAGE) 500 MG tablet, TAKE 1 TABLET BY MOUTH EVERY MORNING., Disp: 90 tablet, Rfl: 4 .  metolazone (ZAROXOLYN) 5 MG  tablet, Take 1 tablet (5 mg total) by mouth daily., Disp: 3 tablet, Rfl: 0 .  omega-3 acid ethyl esters (LOVAZA) 1 g capsule, TAKE FOUR CAPSULES BY MOUTH DAILY, Disp: 120 capsule, Rfl: 6 .  oxyCODONE-acetaminophen (PERCOCET) 10-325 MG tablet, Take 1 tablet by mouth every 4 (four) hours as needed., Disp: 150 tablet, Rfl: 0 .  potassium chloride SA (K-DUR,KLOR-CON) 20 MEQ tablet, Take 1 tablet (20 mEq total) by mouth daily. And an additional 65mq in the afternoon when taking the metolazone, Disp: 45 tablet, Rfl: 5 .  RANEXA 500 MG 12 hr tablet, Take 1,000 mg by mouth 2 (two) times daily., Disp: , Rfl:  .  sotalol (BETAPACE) 120 MG tablet, Take 120 mg by mouth 2 (two) times daily. , Disp: , Rfl:  .  SPIRIVA HANDIHALER 18 MCG inhalation capsule, INHALE 1 CAPSULE VIA INHALER ONCE A DAY, Disp: 30 capsule, Rfl: 12 .  sucralfate (CARAFATE) 1 g tablet, Take 1 tablet (1 g total) by mouth 2 (two) times daily., Disp: 20 tablet, Rfl: 0 .  tamsulosin (FLOMAX) 0.4 MG CAPS capsule, Take 0.4 mg by mouth daily after breakfast. , Disp: , Rfl:  .  torsemide (DEMADEX) 100 MG tablet, Take 50 mg by mouth daily. , Disp: , Rfl:  .  cyclobenzaprine (FLEXERIL) 5 MG tablet, Take 1-2 tablets (5-10 mg total) by mouth 3 (three) times daily as needed (back pain). (Patient not taking: Reported on 05/28/2017), Disp: 30 tablet, Rfl: 2 .  famotidine (PEPCID) 40 MG tablet, Take 1 tablet (40 mg total) by mouth every evening., Disp: 30 tablet, Rfl: 11 .  lansoprazole (PREVACID) 30 MG capsule, Take 30 mg by mouth 2 (two) times daily. , Disp: , Rfl:  .  nitroGLYCERIN (NITROSTAT) 0.4 MG SL tablet, Place 1 tablet (0.4 mg total) under the tongue every 5 (five) minutes as needed for chest pain. Reported on 05/26/2016 (Patient not taking: Reported on 05/28/2017), Disp: 30 tablet, Rfl: 1  Review of Systems  Constitutional: Positive for fatigue.  Respiratory: Positive for cough, chest tightness, shortness of breath and wheezing (sometimes).     Cardiovascular: Positive for palpitations. Negative for leg swelling.  Genitourinary: Positive for frequency. Negative for difficulty urinating and dysuria.  Musculoskeletal: Positive for back pain (chronic).  Neurological: Positive for dizziness and light-headedness.    Social History  Substance Use Topics  . Smoking status: Former Smoker    Quit date: 04/22/2013  . Smokeless tobacco: Never Used  . Alcohol use No     Comment: remotely quit alcohol use. Hx of heavy alcohol use.   Objective:   BP 118/72   Pulse 60   Temp (!) 97.4 F (36.3 C)   Resp 18   Wt 232  lb (105.2 kg)   BMI 36.34 kg/m  Vitals:   05/28/17 1356  BP: 118/72  Pulse: 60  Resp: 18  Temp: (!) 97.4 F (36.3 C)  Weight: 232 lb (105.2 kg)     Physical Exam   General Appearance:    Alert, cooperative, no distress  Eyes:    PERRL, conjunctiva/corneas clear, EOM's intact       Lungs:     Clear to auscultation bilaterally, respirations unlabored  Heart:    Regular rate and rhythm  MS:     Diffusely tenderness of lumbar and para lumbar musculature.   Neurologic:   Awake, alert, oriented x 3. No apparent focal neurological           defect.       . Results for orders placed or performed in visit on 05/28/17  POCT A1C  Result Value Ref Range   Hemoglobin A1C 6.3         Assessment & Plan:     1. Type 2 diabetes mellitus with other circulatory complication, without long-term current use of insulin (HCC) Well controlled.  Continue current medications.   - POCT A1C  2. Back pain with left-sided radiculopathy Doing well with current pain medication regiment.   3. Other acute back pain  - cyclobenzaprine (FLEXERIL) 5 MG tablet; Take 1-2 tablets (5-10 mg total) by mouth 3 (three) times daily as needed (back pain).  Dispense: 60 tablet; Refill: 5  The entirety of the information documented in the History of Present Illness, Review of Systems and Physical Exam were personally obtained by me. Portions of  this information were initially documented by Theressa Millard, CMA and reviewed by me for thoroughness and accuracy.   Return in about 3 months (around 08/28/2017).       [name], MD  [facility]

## 2017-05-28 NOTE — Telephone Encounter (Signed)
rx called in-aa 

## 2017-05-30 NOTE — Telephone Encounter (Signed)
Please call in alprazolam.

## 2017-05-31 ENCOUNTER — Encounter: Payer: Self-pay | Admitting: *Deleted

## 2017-05-31 ENCOUNTER — Other Ambulatory Visit: Payer: Self-pay | Admitting: *Deleted

## 2017-05-31 ENCOUNTER — Ambulatory Visit: Payer: Self-pay | Admitting: *Deleted

## 2017-05-31 NOTE — Telephone Encounter (Signed)
Rx called in to pharmacy. 

## 2017-05-31 NOTE — Patient Outreach (Signed)
[facility]) Care Management  05/31/2017   Lawrence [name] February 19, 1956 [id number]    RN Health Coach telephone call to patient.  [employer] compliance verified. Per patient he is doing much better. Patient has next cardiac appointment later this week. Per patient he is in the good zone. No swelling, no increase shortness of breath than usual. Patient stated he is exercising. He goes walking 3 times a day. Patient is still currently on oxygen.  Patient stated he does get some shortness of breath with moderate exertion due to his COPD. RN asked patient if he would be interested in any outpatient rehab and patient declined. His A1C has decreased to 6.3 and his cardiac EF has improved. Per patient he is moving into their new apartment. He is trying to get his furniture moved in. He has not started using his CPAP yet. Per patient he is taking his medications as prescribed.     Current Medications:  Current Outpatient Prescriptions  Medication Sig Dispense Refill  . albuterol (PROVENTIL HFA;VENTOLIN HFA) 108 (90 Base) MCG/ACT inhaler Inhale 4-6 puffs into the lungs every 4 (four) hours as needed for wheezing or shortness of breath. Reported on 04/08/2016    . allopurinol (ZYLOPRIM) 100 MG tablet TAKE 3 TABLETS BY MOUTH EVERY DAY. 90 tablet 4  . ALPRAZolam (XANAX) 1 MG tablet TAKE ONE-HALF TO ONE TABLET BY MOUTH THREE TIMES DAILY AS NEEDED FOR ANXIETY 90 tablet 5  . apixaban (ELIQUIS) 5 MG TABS tablet Take 5 mg by mouth 2 (two) times daily.    Marland Kitchen atorvastatin (LIPITOR) 80 MG tablet TAKE 1 TABLET BY MOUTH EVERY NIGHT AT BEDTIME. 90 tablet 4  . cetirizine (ZYRTEC) 10 MG tablet TAKE ONE TABLET BY MOUTH AT BEDTIME. 30 tablet 11  . clopidogrel (PLAVIX) 75 MG tablet TAKE 1 TABLET BY MOUTH DAILY WITH BREAKFAST 30 tablet 12  . cyclobenzaprine (FLEXERIL) 5 MG tablet Take 1-2 tablets (5-10 mg total) by mouth 3 (three) times daily as needed (back pain). 60 tablet 5  . docusate sodium (COLACE) 100 MG  capsule Take 100 mg by mouth daily.     . empagliflozin (JARDIANCE) 10 MG TABS tablet Take 10 mg by mouth daily. 30 tablet 3  . famotidine (PEPCID) 40 MG tablet Take 1 tablet (40 mg total) by mouth every evening. 30 tablet 11  . fluticasone furoate-vilanterol (BREO ELLIPTA) 100-25 MCG/INH AEPB Inhale 1 puff into the lungs daily.     Marland Kitchen glucose blood (ACCU-CHEK AVIVA PLUS) test strip Use to check blood sugar once a day 100 each 4  . isosorbide mononitrate (IMDUR) 60 MG 24 hr tablet Take 1 tablet (60 mg total) by mouth daily. (Patient taking differently: Take 60 mg by mouth 2 (two) times daily. ) 60 tablet 1  . lansoprazole (PREVACID) 30 MG capsule Take 30 mg by mouth 2 (two) times daily.     Marland Kitchen lisinopril (PRINIVIL,ZESTRIL) 2.5 MG tablet Take 1 tablet (2.5 mg total) by mouth daily. 30 tablet 5  . Magnesium Oxide 400 (240 Mg) MG TABS TAKE ONE TABLET BY MOUTH DAILY 30 tablet 12  . metFORMIN (GLUCOPHAGE) 500 MG tablet TAKE 1 TABLET BY MOUTH EVERY MORNING. 90 tablet 4  . metolazone (ZAROXOLYN) 5 MG tablet Take 1 tablet (5 mg total) by mouth daily. 3 tablet 0  . nitroGLYCERIN (NITROSTAT) 0.4 MG SL tablet Place 1 tablet (0.4 mg total) under the tongue every 5 (five) minutes as needed for chest pain. Reported on 05/26/2016 (Patient not taking:  Reported on 05/28/2017) 30 tablet 1  . omega-3 acid ethyl esters (LOVAZA) 1 g capsule TAKE FOUR CAPSULES BY MOUTH DAILY 120 capsule 6  . oxyCODONE-acetaminophen (PERCOCET) 10-325 MG tablet Take 1 tablet by mouth every 4 (four) hours as needed. 150 tablet 0  . potassium chloride SA (K-DUR,KLOR-CON) 20 MEQ tablet Take 1 tablet (20 mEq total) by mouth daily. And an additional 3mq in the afternoon when taking the metolazone 45 tablet 5  . RANEXA 500 MG 12 hr tablet Take 1,000 mg by mouth 2 (two) times daily.    . sotalol (BETAPACE) 120 MG tablet Take 120 mg by mouth 2 (two) times daily.     .Marland KitchenSPIRIVA HANDIHALER 18 MCG inhalation capsule INHALE 1 CAPSULE VIA INHALER ONCE A  DAY 30 capsule 12  . sucralfate (CARAFATE) 1 g tablet Take 1 tablet (1 g total) by mouth 2 (two) times daily. 20 tablet 0  . tamsulosin (FLOMAX) 0.4 MG CAPS capsule Take 0.4 mg by mouth daily after breakfast.     . torsemide (DEMADEX) 100 MG tablet Take 50 mg by mouth daily.      No current facility-administered medications for this visit.     Functional Status:  In your present state of health, do you have any difficulty performing the following activities: 05/31/2017 03/24/2017  Hearing? N N  Vision? N N  Difficulty concentrating or making decisions? N N  Walking or climbing stairs? Y Y  Dressing or bathing? N N  Doing errands, shopping? N N  Preparing Food and eating ? N -  Using the Toilet? N -  In the past six months, have you accidently leaked urine? N -  Do you have problems with loss of bowel control? N -  Managing your Medications? N -  Managing your Finances? N -  Housekeeping or managing your Housekeeping? Y -  Some recent data might be hidden    Fall/Depression Screening: Fall Risk  05/31/2017 03/24/2017 03/10/2017  Falls in the past year? No Yes Yes  Number falls in past yr: - 1 1  Injury with Fall? - No No  Risk for fall due to : - - History of fall(s)  Follow up - - Falls prevention discussed   PHQ 2/9 Scores 05/31/2017 03/24/2017 03/10/2017 03/04/2017 02/19/2017 02/11/2017 01/14/2017  PHQ - 2 Score 0 0 0 0 0 0 0  PHQ- 9 Score - - - - - - -   THN CM Care Plan Problem One     Most Recent Value  Care Plan Problem One  Knowledge deficit in self management of Congestive Heart Failure  Role Documenting the Problem One  [place] Problem One  Active  THN Long Term Goal   Patient will not have any readmissions for Congestive Heart Failure within the next 90 days  THN Long Term Goal Met Date  05/31/17  Interventions for Problem One Long Term Goal  RN reminded patient the importance of taking  medications as per physician ordered. Patient reminded to keep  appointments with PCP and cardiology. RN will follow monthly telephonic.   THN CM Short Term Goal #1 Met Date  05/31/17  Interventions for Short Term Goal #1  RN sent edicational material on Heat zones ans action plan. RN will follow up with further discussion and teachback in next outreach  [facility]2   Patient will be able to verbalize why he weighs daily and what it means within the  next 30 days  Interventions for Short Term Goal #2  RN discussed daily weights and what it mean. RN sent educational material on daily weights. RN will follow up with discussion and teach back      Assessment:  Patient is taking medications as prescribed Patient Ejection Fraction has improved Patient declined any additional cardiopulmonary rehab Patient has moved from hotel to an apartment Patient is weighing self Patient knows zones and action plan Patient has met goals of care   Plan: RN will close case at this time RN will send DR and patient closure letter  [facility] [phone]

## 2017-06-02 ENCOUNTER — Ambulatory Visit: Payer: Medicare HMO | Attending: Family | Admitting: Family

## 2017-06-02 ENCOUNTER — Encounter: Payer: Self-pay | Admitting: Family

## 2017-06-02 VITALS — BP 132/59 | HR 55 | Resp 18 | Ht 67.0 in | Wt 228.1 lb

## 2017-06-02 DIAGNOSIS — Z9981 Dependence on supplemental oxygen: Secondary | ICD-10-CM | POA: Diagnosis not present

## 2017-06-02 DIAGNOSIS — I4891 Unspecified atrial fibrillation: Secondary | ICD-10-CM | POA: Insufficient documentation

## 2017-06-02 DIAGNOSIS — Z818 Family history of other mental and behavioral disorders: Secondary | ICD-10-CM | POA: Diagnosis not present

## 2017-06-02 DIAGNOSIS — E119 Type 2 diabetes mellitus without complications: Secondary | ICD-10-CM | POA: Insufficient documentation

## 2017-06-02 DIAGNOSIS — Z79891 Long term (current) use of opiate analgesic: Secondary | ICD-10-CM | POA: Diagnosis not present

## 2017-06-02 DIAGNOSIS — D649 Anemia, unspecified: Secondary | ICD-10-CM | POA: Insufficient documentation

## 2017-06-02 DIAGNOSIS — Z9049 Acquired absence of other specified parts of digestive tract: Secondary | ICD-10-CM | POA: Diagnosis not present

## 2017-06-02 DIAGNOSIS — G4733 Obstructive sleep apnea (adult) (pediatric): Secondary | ICD-10-CM | POA: Insufficient documentation

## 2017-06-02 DIAGNOSIS — Z885 Allergy status to narcotic agent status: Secondary | ICD-10-CM | POA: Diagnosis not present

## 2017-06-02 DIAGNOSIS — I110 Hypertensive heart disease with heart failure: Secondary | ICD-10-CM | POA: Insufficient documentation

## 2017-06-02 DIAGNOSIS — Z9889 Other specified postprocedural states: Secondary | ICD-10-CM | POA: Insufficient documentation

## 2017-06-02 DIAGNOSIS — Z79899 Other long term (current) drug therapy: Secondary | ICD-10-CM | POA: Insufficient documentation

## 2017-06-02 DIAGNOSIS — Z87891 Personal history of nicotine dependence: Secondary | ICD-10-CM | POA: Diagnosis not present

## 2017-06-02 DIAGNOSIS — Z7984 Long term (current) use of oral hypoglycemic drugs: Secondary | ICD-10-CM | POA: Diagnosis not present

## 2017-06-02 DIAGNOSIS — Z951 Presence of aortocoronary bypass graft: Secondary | ICD-10-CM | POA: Insufficient documentation

## 2017-06-02 DIAGNOSIS — J410 Simple chronic bronchitis: Secondary | ICD-10-CM

## 2017-06-02 DIAGNOSIS — R0602 Shortness of breath: Secondary | ICD-10-CM | POA: Insufficient documentation

## 2017-06-02 DIAGNOSIS — Z8249 Family history of ischemic heart disease and other diseases of the circulatory system: Secondary | ICD-10-CM | POA: Insufficient documentation

## 2017-06-02 DIAGNOSIS — I5032 Chronic diastolic (congestive) heart failure: Secondary | ICD-10-CM | POA: Diagnosis not present

## 2017-06-02 DIAGNOSIS — J449 Chronic obstructive pulmonary disease, unspecified: Secondary | ICD-10-CM | POA: Insufficient documentation

## 2017-06-02 DIAGNOSIS — Z882 Allergy status to sulfonamides status: Secondary | ICD-10-CM | POA: Insufficient documentation

## 2017-06-02 DIAGNOSIS — F419 Anxiety disorder, unspecified: Secondary | ICD-10-CM | POA: Diagnosis not present

## 2017-06-02 DIAGNOSIS — I2510 Atherosclerotic heart disease of native coronary artery without angina pectoris: Secondary | ICD-10-CM | POA: Insufficient documentation

## 2017-06-02 DIAGNOSIS — I10 Essential (primary) hypertension: Secondary | ICD-10-CM

## 2017-06-02 NOTE — Patient Instructions (Signed)
Continue weighing daily and call for an overnight weight gain of > 2 pounds or a weekly weight gain of >5 pounds. 

## 2017-06-02 NOTE — Progress Notes (Signed)
Patient ID: Lawrence [name], male    DOB: 01-24-1956, 61 y.o.   MRN: [id number]  HPI  Lawrence [name] is a 62 y/o male with a history of DM, PSVT, HTN, COPD (chronic oxygen), CAD with CABG, asthma, anxiety, anemia, atrial fibrillation, obstructive sleep apnea,  past tobacco use and chronic heart failure.   Last echo was done 03/27/16 and showed an EF of 60-65% along with trivial Lawrence/TR. EF has improved from 45-50% November 2016. Most recent cardiac catheterization was done 11/02/16 and a DES was placed in the SVG to OM1 artery.   Was in the ED 05/03/17 with abdominal pain. Evaluated and discharged home. Was in the ED on 02/05/17 due to abrasions on both arms that wouldn't stop bleeding. Abrasions eventually stopped bleeding and he was discharged home. Was in the ED 11/30/16 due to the flu. Treated and discharged home. Admitted 10/29/16 due to chest pain. Cardiology consult obtained and catheterization done with DES placed. Discharged home after 5 days.   He presents today for his follow-up visit with a chief complaint of mild shortness of breath with moderate exertion. He says this has been present for many years and seems to be improving. He has associated fatigue along with this.   Past Medical History:  Diagnosis Date  . A-fib ([place])   . Anemia   . Anxiety   . Asthma   . CAD (coronary artery disease)    a. 2002 CABGx2 (LIMA->LAD, VG->VG->OM1);  b. 09/2012 DES->OM;  c. 03/2015 PTCA of LAD [facility]) in setting of atretic LIMA; d. 05/2015 Cath [facility]): nonobs dzs; e. 06/2015 Cath ([facility]): LM nl, LAD 45p/d ISR, 50d, D1/2 small, LCX 50p/d ISR, OM1 70ost, 30 ISR, VG->OM1 50ost, 63m LIMA->LAD 99p/d - atretic, RCA dom, nl; f.cath 10/16: 40-50%(FFR 0.90) pLAD, 75% (FFR 0.77) mLAD s/p PCI/DES, oRCA 40% (FFR0.95)  . Celiac disease   . Chronic diastolic CHF (congestive heart failure) ([place]    a. 06/2009 Echo: EF 60-65%, Gr 1 DD, triv AI, mildly dil LA, nl RV.  .Marland KitchenCOPD (chronic obstructive pulmonary disease) ([place]    a.  Chronic bronchitis and emphysema.  .Marland KitchenDysrhythmia   . Essential hypertension   . History of tobacco abuse    a. Quit 2014.  .Marland KitchenPSVT (paroxysmal supraventricular tachycardia) ([place]    a. 10/2012 Noted on Zio Patch.  . Type II diabetes mellitus ([place]    Past Surgical History:  Procedure Laterality Date  . BYPASS GRAFT    . CARDIAC CATHETERIZATION N/A 07/12/2015   rocedure: Left Heart Cath and Cors/Grafts Angiography;  Surgeon: HBelva Crome MD;  [facility];  Service: Cardiovascular;  Laterality: N/A;  . CARDIAC CATHETERIZATION Right 10/07/2015   Procedure: Left Heart Cath and Cors/Grafts Angiography;  Surgeon: SDionisio David MD;  [place] [facility];  Service: Cardiovascular;  Laterality: Right;  . CARDIAC CATHETERIZATION N/A 04/06/2016   Procedure: Left Heart Cath and Coronary Angiography;  Surgeon: DYolonda Kida MD;  [place] [facility];  Service: Cardiovascular;  Laterality: N/A;  . CARDIAC CATHETERIZATION  04/06/2016   Procedure: Bypass Graft Angiography;  Surgeon: DYolonda Kida MD;  [place] [place];  Service: Cardiovascular;;  . CARDIAC CATHETERIZATION N/A 11/02/2016   Procedure: Left Heart Cath and Cors/Grafts Angiography and possible PCI;  Surgeon: DYolonda Kida MD;  [place];  Service: Cardiovascular;  Laterality: N/A;  . CARDIAC CATHETERIZATION N/A 11/02/2016   Procedure: Coronary Stent Intervention;  Surgeon: Yolonda Kida, MD;  Location: [place] [facility];  Service: Cardiovascular;  Laterality: N/A;  . CHOLECYSTECTOMY    . ESOPHAGEAL DILATION    . TONSILLECTOMY    . VASCULAR SURGERY     Family History  Problem Relation Age of Onset  . Heart attack Mother   . Depression Mother   . Heart disease Mother   . COPD Mother   . Hypertension Mother   . Heart attack Father   . Diabetes Father   . Depression Father   . Heart disease Father   . Cirrhosis Father   . Parkinson's disease  Brother    Social History  Substance Use Topics  . Smoking status: Former Smoker    Quit date: 04/22/2013  . Smokeless tobacco: Never Used  . Alcohol use No     Comment: remotely quit alcohol use. Hx of heavy alcohol use.   Allergies  Allergen Reactions  . Demerol [Meperidine] Hives  . Prednisone Other (See Comments) and Hypertension    Pt states that this medication puts him in A-fib   . Sulfa Antibiotics Hives  . Albuterol Sulfate [Albuterol] Palpitations and Other (See Comments)    Pt currently uses this medication.    . Morphine Sulfate Nausea And Vomiting, Rash and Other (See Comments)    Pt states that he is only allergic to the tablet form of this medication.     Prior to Admission medications   Medication Sig Start Date End Date Taking? Authorizing Provider  albuterol (PROVENTIL HFA;VENTOLIN HFA) 108 (90 Base) MCG/ACT inhaler Inhale 4-6 puffs into the lungs every 4 (four) hours as needed for wheezing or shortness of breath. Reported on 04/08/2016   Yes [provider]  allopurinol (ZYLOPRIM) 100 MG tablet TAKE 3 TABLETS BY MOUTH EVERY DAY. 03/22/17  Yes Birdie Sons, MD  ALPRAZolam Duanne Moron) 1 MG tablet TAKE ONE-HALF TO ONE TABLET BY MOUTH THREE TIMES DAILY AS NEEDED FOR ANXIETY 05/30/17  Yes Birdie Sons, MD  apixaban (ELIQUIS) 5 MG TABS tablet Take 5 mg by mouth 2 (two) times daily.   Yes [provider]  atorvastatin (LIPITOR) 80 MG tablet TAKE 1 TABLET BY MOUTH EVERY NIGHT AT BEDTIME. 05/26/16  Yes Birdie Sons, MD  cetirizine (ZYRTEC) 10 MG tablet TAKE ONE TABLET BY MOUTH AT BEDTIME. 05/12/17  Yes Birdie Sons, MD  clopidogrel (PLAVIX) 75 MG tablet TAKE 1 TABLET BY MOUTH DAILY WITH BREAKFAST 01/25/17  Yes Birdie Sons, MD  cyclobenzaprine (FLEXERIL) 5 MG tablet Take 1-2 tablets (5-10 mg total) by mouth 3 (three) times daily as needed (back pain). 05/28/17  Yes Birdie Sons, MD  docusate sodium (COLACE) 100 MG capsule Take 100 mg by mouth  daily.    Yes [provider]  empagliflozin (JARDIANCE) 10 MG TABS tablet Take 10 mg by mouth daily. 03/05/17  Yes Birdie Sons, MD  famotidine (PEPCID) 40 MG tablet Take 1 tablet (40 mg total) by mouth every evening. 05/12/17 05/12/18 Yes [name], [name], MD  fluticasone furoate-vilanterol (BREO ELLIPTA) 100-25 MCG/INH AEPB Inhale 1 puff into the lungs daily.    Yes [provider]  glucose blood (ACCU-CHEK AVIVA PLUS) test strip Use to check blood sugar once a day 11/26/16  Yes [name], [name], MD  isosorbide mononitrate (IMDUR) 60 MG 24 hr tablet Take 1 tablet (60 mg total) by mouth daily. Patient taking differently: Take 60 mg by mouth 2 (two) times daily.  11/04/16  Yes Henreitta Leber, MD  lansoprazole (PREVACID) 30 MG capsule Take 30 mg by mouth 2 (two) times daily.    Yes [provider]  lisinopril (PRINIVIL,ZESTRIL) 2.5 MG tablet Take 1 tablet (2.5 mg total) by mouth daily. 02/19/17  Yes Darylene Price A, FNP  Magnesium Oxide 400 (240 Mg) MG TABS TAKE ONE TABLET BY MOUTH DAILY 03/23/17  Yes Birdie Sons, MD  metFORMIN (GLUCOPHAGE) 500 MG tablet TAKE 1 TABLET BY MOUTH EVERY MORNING. 10/05/16  Yes Birdie Sons, MD  nitroGLYCERIN (NITROSTAT) 0.4 MG SL tablet Place 1 tablet (0.4 mg total) under the tongue every 5 (five) minutes as needed for chest pain. Reported on 05/26/2016 10/06/16  Yes Birdie Sons, MD  omega-3 acid ethyl esters (LOVAZA) 1 g capsule TAKE FOUR CAPSULES BY MOUTH DAILY 07/25/16  Yes Birdie Sons, MD  oxyCODONE-acetaminophen (PERCOCET) 10-325 MG tablet Take 1 tablet by mouth every 4 (four) hours as needed. 05/20/17  Yes Birdie Sons, MD  potassium chloride SA (K-DUR,KLOR-CON) 20 MEQ tablet Take 1 tablet (20 mEq total) by mouth daily. And an additional 8mq in the afternoon when taking the metolazone 03/05/17 06/03/17 Yes Hackney, Tina A, FNP  RANEXA 500 MG 12 hr tablet Take 1,000 mg by mouth 2 (two) times daily.   Yes [provider]  sotalol (BETAPACE) 120 MG tablet Take 120 mg by mouth 2 (two) times daily.    Yes [provider]  SPIRIVA HANDIHALER 18 MCG inhalation capsule INHALE 1 CAPSULE VIA INHALER ONCE A DAY 01/25/17  Yes FBirdie Sons MD  sucralfate (CARAFATE) 1 g tablet Take 1 tablet (1 g total) by mouth 2 (two) times daily. 05/03/17  Yes WLoney Hering MD  tamsulosin (FLOMAX) 0.4 MG CAPS capsule Take 0.4 mg by mouth daily after breakfast.    Yes [provider]  torsemide (DEMADEX) 100 MG tablet Take 50 mg by mouth daily.    Yes [provider]   Review of Systems  Constitutional: Positive for fatigue (mild). Negative for appetite change.  HENT: Negative for congestion, rhinorrhea and sore throat.   Eyes: Negative.   Respiratory: Positive for cough and shortness of breath. Negative for chest tightness and wheezing.   Cardiovascular: Negative for chest pain, palpitations and leg swelling.  Gastrointestinal: Negative for abdominal distention, abdominal pain, constipation and nausea.  Endocrine: Negative.   Genitourinary: Negative.   Musculoskeletal: Positive for back pain (chronic). Negative for neck pain.  Skin: Negative.   Allergic/Immunologic: Negative.   Neurological: Negative for dizziness, weakness and light-headedness.  Hematological: Negative for adenopathy. Bruises/bleeds easily.  Psychiatric/Behavioral: Positive for sleep disturbance (wearing oxygen at 2L; not wearing CPAP due to space issues). Negative for dysphoric mood and suicidal ideas. The patient is not nervous/anxious.    Vitals:   06/02/17 0850  BP: (!) 132/59  Pulse: (!) 55  Resp: 18  SpO2: 100%  Weight: 228 lb 2 oz (103.5 kg)  Height: 5' 7"  (1.702 m)   Wt Readings from Last 3 Encounters:  06/02/17 228 lb 2 oz (103.5 kg)  05/28/17 232 lb (105.2 kg)  03/26/17 242 lb (109.8 kg)    Lab Results  Component Value Date   CREATININE 1.36 (H) 05/03/2017   CREATININE 1.02 03/24/2017   CREATININE 2.00  (H) 03/10/2017    Physical Exam  Constitutional: He is oriented to person, place, and time. He appears well-developed and well-nourished.  HENT:  Head: Normocephalic and atraumatic.  Neck: Normal range of motion.  Neck supple. No JVD present.  Cardiovascular: Normal rate and regular rhythm.   Pulmonary/Chest: Effort normal. He has no wheezes. He has no rales.  Abdominal: Soft. He exhibits no distension. There is tenderness (above umbilicus).  Musculoskeletal: He exhibits no edema or tenderness.  Neurological: He is alert and oriented to person, place, and time.  Skin: Skin is warm and dry.  Psychiatric: He has a normal mood and affect. His behavior is normal. Thought content normal.  Nursing note and vitals reviewed.   Assessment & Plan:  1: Chronic heart failure with preserved ejection fraction- - NYHA class II - euvolemic - weighing daily. Instructed to call for an overnight weight gain of >2 pounds or a weekly weight gain of >5 pounds. Weight down 14.4 pounds since he was last here.  - not adding salt and is currently rinsing canned foods. Reviewed the importance of closely following a 2037m sodium diet as he's eating quite a bit of microwave meals since he's still living in a motel - BMP drawn 05/03/17 reviewed; potassium 3.7 and GFR 54 - saw cardiologist ([name] 03/25/17 and returns to him 06/14/17 -[facility]. He will be moving into an apartment with [employer] he gets furniture delivered on 06/26/17  2: Obstructive sleep apnea- - wears oxygen at 2L around the clock - has CPAP equipment but it's currently in storage as he's been living in a hotel with family for the last year and doesn't have any space to place the machine - he is planning on getting his CPAP unpacked as soon as he moves into his apartment  3: HTN- - BP looks good today - saw PCP ([name] 05/28/17 and returns 08/31/17  4: COPD- - recently had pulmonary function test -  continues to wear oxygen at 2L around the clock - sees pulmonologist (Humphrey Rolls 07/27/17  Patient did not bring his medications nor a list. Each medication was verbally reviewed with the patient and he was encouraged to bring the bottles to every visit to confirm accuracy of list.  Return here in 4 months or sooner for any questions/problems before then.

## 2017-06-04 ENCOUNTER — Emergency Department: Payer: Medicare HMO

## 2017-06-04 ENCOUNTER — Other Ambulatory Visit: Payer: Self-pay | Admitting: Family Medicine

## 2017-06-04 ENCOUNTER — Emergency Department
Admission: EM | Admit: 2017-06-04 | Discharge: 2017-06-05 | Disposition: A | Discharge status: Home / Self Care | Payer: Medicare HMO | Attending: Emergency Medicine | Admitting: Emergency Medicine

## 2017-06-04 DIAGNOSIS — I5032 Chronic diastolic (congestive) heart failure: Secondary | ICD-10-CM | POA: Insufficient documentation

## 2017-06-04 DIAGNOSIS — R42 Dizziness and giddiness: Secondary | ICD-10-CM | POA: Diagnosis not present

## 2017-06-04 DIAGNOSIS — J449 Chronic obstructive pulmonary disease, unspecified: Secondary | ICD-10-CM | POA: Insufficient documentation

## 2017-06-04 DIAGNOSIS — J45909 Unspecified asthma, uncomplicated: Secondary | ICD-10-CM | POA: Insufficient documentation

## 2017-06-04 DIAGNOSIS — M542 Cervicalgia: Secondary | ICD-10-CM | POA: Insufficient documentation

## 2017-06-04 DIAGNOSIS — N189 Chronic kidney disease, unspecified: Secondary | ICD-10-CM | POA: Diagnosis not present

## 2017-06-04 DIAGNOSIS — Z87891 Personal history of nicotine dependence: Secondary | ICD-10-CM | POA: Diagnosis not present

## 2017-06-04 DIAGNOSIS — R21 Rash and other nonspecific skin eruption: Secondary | ICD-10-CM

## 2017-06-04 DIAGNOSIS — Z7902 Long term (current) use of antithrombotics/antiplatelets: Secondary | ICD-10-CM | POA: Insufficient documentation

## 2017-06-04 DIAGNOSIS — Z7984 Long term (current) use of oral hypoglycemic drugs: Secondary | ICD-10-CM | POA: Diagnosis not present

## 2017-06-04 DIAGNOSIS — N481 Balanitis: Secondary | ICD-10-CM

## 2017-06-04 DIAGNOSIS — Z79899 Other long term (current) drug therapy: Secondary | ICD-10-CM | POA: Insufficient documentation

## 2017-06-04 DIAGNOSIS — Z7901 Long term (current) use of anticoagulants: Secondary | ICD-10-CM | POA: Insufficient documentation

## 2017-06-04 DIAGNOSIS — I130 Hypertensive heart and chronic kidney disease with heart failure and stage 1 through stage 4 chronic kidney disease, or unspecified chronic kidney disease: Secondary | ICD-10-CM | POA: Diagnosis not present

## 2017-06-04 DIAGNOSIS — E119 Type 2 diabetes mellitus without complications: Secondary | ICD-10-CM | POA: Insufficient documentation

## 2017-06-04 LAB — COMPREHENSIVE METABOLIC PANEL
ALT: 16 U/L — ABNORMAL LOW (ref 17–63)
AST: 19 U/L (ref 15–41)
Albumin: 4.1 g/dL (ref 3.5–5.0)
Alkaline Phosphatase: 73 U/L (ref 38–126)
Anion gap: 5 (ref 5–15)
BUN: 13 mg/dL (ref 6–20)
CO2: 30 mmol/L (ref 22–32)
Calcium: 8.8 mg/dL — ABNORMAL LOW (ref 8.9–10.3)
Chloride: 100 mmol/L — ABNORMAL LOW (ref 101–111)
Creatinine, Ser: 1.38 mg/dL — ABNORMAL HIGH (ref 0.61–1.24)
GFR calc Af Amer: >60 mL/min (ref >60)
GFR calc non Af Amer: 53 mL/min — ABNORMAL LOW (ref >60)
Glucose, Bld: 107 mg/dL — ABNORMAL HIGH (ref 65–99)
Potassium: 4.1 mmol/L (ref 3.5–5.1)
Sodium: 135 mmol/L (ref 135–145)
Total Bilirubin: 0.9 mg/dL (ref 0.3–1.2)
Total Protein: 7.1 g/dL (ref 6.5–8.1)

## 2017-06-04 LAB — URINALYSIS, COMPLETE (UACMP) WITH MICROSCOPIC
Bilirubin Urine: NEGATIVE
Glucose, UA: >=500 mg/dL — AB
Hgb urine dipstick: NEGATIVE
Ketones, ur: NEGATIVE mg/dL
Leukocytes, UA: NEGATIVE
Nitrite: NEGATIVE
Protein, ur: NEGATIVE mg/dL
Specific Gravity, Urine: 1.013 (ref 1.005–1.030)
Squamous Epithelial / LPF: NONE SEEN
pH: 5 (ref 5.0–8.0)

## 2017-06-04 LAB — DIFFERENTIAL
Basophils Absolute: 0.1 10*3/uL (ref 0–0.1)
Basophils Relative: 1 %
Eosinophils Absolute: 0.3 10*3/uL (ref 0–0.7)
Eosinophils Relative: 4 %
Lymphocytes Relative: 28 %
Lymphs Abs: 2.2 10*3/uL (ref 1.0–3.6)
Monocytes Absolute: 0.6 10*3/uL (ref 0.2–1.0)
Monocytes Relative: 8 %
Neutro Abs: 4.5 10*3/uL (ref 1.4–6.5)
Neutrophils Relative %: 59 %

## 2017-06-04 LAB — CBC
HCT: 47.9 % (ref 40.0–52.0)
Hemoglobin: 15.9 g/dL (ref 13.0–18.0)
MCH: 29.6 pg (ref 26.0–34.0)
MCHC: 33.2 g/dL (ref 32.0–36.0)
MCV: 89 fL (ref 80.0–100.0)
Platelets: 208 10*3/uL (ref 150–440)
RBC: 5.39 MIL/uL (ref 4.40–5.90)
RDW: 15.6 % — ABNORMAL HIGH (ref 11.5–14.5)
WBC: 7.7 10*3/uL (ref 3.8–10.6)

## 2017-06-04 LAB — APTT: aPTT: 37 seconds — ABNORMAL HIGH (ref 24–36)

## 2017-06-04 LAB — TROPONIN I: Troponin I: <0.03 ng/mL (ref <0.03)

## 2017-06-04 LAB — PROTIME-INR
INR: 1.2
Prothrombin Time: 15.3 seconds — ABNORMAL HIGH (ref 11.4–15.2)

## 2017-06-04 NOTE — ED Notes (Signed)
Pt placed on O2 at 2 l/min via [place]. Pt reports this is what he wears around the clock at home but his battery in his portable O2 concentrator ran out.

## 2017-06-04 NOTE — ED Notes (Signed)
Pt updated on care plan. Pt verbalizes understanding.

## 2017-06-04 NOTE — ED Notes (Signed)
Pt also complains of bilateral forearm rash for one week. Pt has red non raised rash noted to bilateral forearms. Pt denies itching. Pt also complains of "bruising" to the head of his penis. Pt denies fever. Pt states 3 hours pta he also began to experience left shoulder pain that radiates to left lateral neck accompanied by dizziness. Pt states he is chronically on 2lpm oxygen via [place].

## 2017-06-04 NOTE — ED Triage Notes (Signed)
Pt ambulatory to triage with no difficulty. Pt reports he has a rash to his arms, neck, back and legs for "several days". Pt also reports bruising to the head of his penis. Pt is on blood thinners. Pt now also stating pain that starts over his mid shoulder and runs up his neck to behind his ear on the left. Pt has no neuro deficits and denies injury to the area.

## 2017-06-05 NOTE — ED Provider Notes (Signed)
[facility] Emergency Department Provider Note  ____________________________________________   First MD Initiated Contact with Patient 06/04/17 2327     (approximate)  I have reviewed the triage vital signs and the nursing notes.   HISTORY  Chief Complaint Rash; Neck Pain; and Dizziness    HPI Lawrence [name] is a 61 y.o. male with an extensive past medical history who presents with a variety of complaints.  His 2 main complaints are that he has some pain in his left side of his neck that feels like a tightness in the left side of the back of his head as well.  He does point out that he was wrestling with a grandchild recently and may have pulled his muscles.  He has no headache and the pain is reproducible with touching the left side of his neck and turning his head to the left side.  He is also concerned about what he describes as bruising to the head of his penis.  He just noticed it over the last couple of days.  He says that it does burn and itch a little bit.  Nothing in particular makes it better or worse.  He is uncircumcised and so he only noticed it when he specifically retracted the foreskin and took a look.  Lastly he states that for several weeks he has had some rash on his arms primarily but also some on his neck, back, and legs.  He states that it does not itch or hurt, he just does not know why he has a rash.  He describes it as mild to moderate.   Past Medical History:  Diagnosis Date  . A-fib ([place])   . Anemia   . Anxiety   . Asthma   . CAD (coronary artery disease)    a. 2002 CABGx2 (LIMA->LAD, VG->VG->OM1);  b. 09/2012 DES->OM;  c. 03/2015 PTCA of LAD [facility]) in setting of atretic LIMA; d. 05/2015 Cath [facility]): nonobs dzs; e. 06/2015 Cath ([facility]): LM nl, LAD 45p/d ISR, 50d, D1/2 small, LCX 50p/d ISR, OM1 70ost, 30 ISR, VG->OM1 50ost, 66m LIMA->LAD 99p/d - atretic, RCA dom, nl; f.cath 10/16: 40-50%(FFR 0.90) pLAD, 75% (FFR 0.77) mLAD s/p PCI/DES,  oRCA 40% (FFR0.95)  . Celiac disease   . Chronic diastolic CHF (congestive heart failure) ([place]    a. 06/2009 Echo: EF 60-65%, Gr 1 DD, triv AI, mildly dil LA, nl RV.  .Marland KitchenCOPD (chronic obstructive pulmonary disease) ([place]    a. Chronic bronchitis and emphysema.  .Marland KitchenDysrhythmia   . Essential hypertension   . History of tobacco abuse    a. Quit 2014.  .Marland KitchenPSVT (paroxysmal supraventricular tachycardia) ([place]    a. 10/2012 Noted on Zio Patch.  . Type II diabetes mellitus ([facility]     Patient Active Problem List   Diagnosis Date Noted  . Bilateral flank pain 03/24/2017  . Dyspnea 04/03/2016  . Hypotension 04/03/2016  . Chronic kidney disease 04/03/2016  . Anemia 04/03/2016  . Paroxysmal atrial fibrillation ([place] 12/23/2015  . OSA (obstructive sleep apnea) 12/10/2015  . Left inguinal hernia 11/07/2015  . Anxiety 11/07/2015  . Unstable angina ([place] 10/05/2015  . Back pain with left-sided radiculopathy 09/30/2015  . Nocturnal hypoxia 09/06/2015  . BPH (benign prostatic hyperplasia) 08/01/2015  . Chronic diastolic CHF (congestive heart failure) ([place]   . Angina pectoris ([place]   . Chest pain 07/11/2015  . COPD (chronic obstructive pulmonary disease) ([place] 07/03/2015  . CAD (coronary artery disease) 06/26/2015  .  HTN (hypertension) 06/26/2015  . DM (diabetes mellitus) ([place]) 06/26/2015  . Achalasia 07/24/2014  . GERD (gastroesophageal reflux disease) 06/07/2014  . Former tobacco use 04/11/2013  . HLD (hyperlipidemia) 04/09/2013    Past Surgical History:  Procedure Laterality Date  . BYPASS GRAFT    . CARDIAC CATHETERIZATION N/A 07/12/2015   rocedure: Left Heart Cath and Cors/Grafts Angiography;  Surgeon: Belva Crome, MD;  Location: [place] CV LAB;  Service: Cardiovascular;  Laterality: N/A;  . CARDIAC CATHETERIZATION Right 10/07/2015   Procedure: Left Heart Cath and Cors/Grafts Angiography;  Surgeon: Dionisio David, MD;  Location: [place] CV LAB;  Service: Cardiovascular;   Laterality: Right;  . CARDIAC CATHETERIZATION N/A 04/06/2016   Procedure: Left Heart Cath and Coronary Angiography;  Surgeon: Yolonda Kida, MD;  Location: [place] CV LAB;  Service: Cardiovascular;  Laterality: N/A;  . CARDIAC CATHETERIZATION  04/06/2016   Procedure: Bypass Graft Angiography;  Surgeon: Yolonda Kida, MD;  Location: [place] CV LAB;  Service: Cardiovascular;;  . CARDIAC CATHETERIZATION N/A 11/02/2016   Procedure: Left Heart Cath and Cors/Grafts Angiography and possible PCI;  Surgeon: Yolonda Kida, MD;  Location: [place] CV LAB;  Service: Cardiovascular;  Laterality: N/A;  . CARDIAC CATHETERIZATION N/A 11/02/2016   Procedure: Coronary Stent Intervention;  Surgeon: Yolonda Kida, MD;  Location: [place] [facility] LAB;  Service: Cardiovascular;  Laterality: N/A;  . CHOLECYSTECTOMY    . ESOPHAGEAL DILATION    . TONSILLECTOMY    . VASCULAR SURGERY      Prior to Admission medications   Medication Sig Start Date End Date Taking? Authorizing Provider  albuterol (PROVENTIL HFA;VENTOLIN HFA) 108 (90 Base) MCG/ACT inhaler Inhale 4-6 puffs into the lungs every 4 (four) hours as needed for wheezing or shortness of breath. Reported on 04/08/2016    [provider]  allopurinol (ZYLOPRIM) 100 MG tablet TAKE 3 TABLETS BY MOUTH EVERY DAY. 03/22/17   Birdie Sons, MD  ALPRAZolam Duanne Moron) 1 MG tablet TAKE ONE-HALF TO ONE TABLET BY MOUTH THREE TIMES DAILY AS NEEDED FOR ANXIETY 05/30/17   Birdie Sons, MD  apixaban (ELIQUIS) 5 MG TABS tablet Take 5 mg by mouth 2 (two) times daily.    [provider]  atorvastatin (LIPITOR) 80 MG tablet TAKE 1 TABLET BY MOUTH EVERY NIGHT AT BEDTIME. 05/26/16   Birdie Sons, MD  cetirizine (ZYRTEC) 10 MG tablet TAKE ONE TABLET BY MOUTH AT BEDTIME. 05/12/17   Birdie Sons, MD  clopidogrel (PLAVIX) 75 MG tablet TAKE 1 TABLET BY MOUTH DAILY WITH BREAKFAST 01/25/17   Birdie Sons, MD  cyclobenzaprine (FLEXERIL)  5 MG tablet Take 1-2 tablets (5-10 mg total) by mouth 3 (three) times daily as needed (back pain). 05/28/17   Birdie Sons, MD  docusate sodium (COLACE) 100 MG capsule Take 100 mg by mouth daily.     [provider]  empagliflozin (JARDIANCE) 10 MG TABS tablet Take 10 mg by mouth daily. 03/05/17   Birdie Sons, MD  famotidine (PEPCID) 40 MG tablet Take 1 tablet (40 mg total) by mouth every evening. 05/12/17 05/12/18  Birdie Sons, MD  fluticasone furoate-vilanterol (BREO ELLIPTA) 100-25 MCG/INH AEPB Inhale 1 puff into the lungs daily.     [provider]  glucose blood (ACCU-CHEK AVIVA PLUS) test strip Use to check blood sugar once a day 11/26/16   Birdie Sons, MD  isosorbide mononitrate (IMDUR) 60 MG 24 hr tablet Take 1 tablet (  60 mg total) by mouth daily. Patient taking differently: Take 60 mg by mouth 2 (two) times daily.  11/04/16   Henreitta Leber, MD  lansoprazole (PREVACID) 30 MG capsule Take 30 mg by mouth 2 (two) times daily.     [provider]  lisinopril (PRINIVIL,ZESTRIL) 2.5 MG tablet Take 1 tablet (2.5 mg total) by mouth daily. 02/19/17   Alisa Graff, FNP  Magnesium Oxide 400 (240 Mg) MG TABS TAKE ONE TABLET BY MOUTH DAILY 03/23/17   Birdie Sons, MD  metFORMIN (GLUCOPHAGE) 500 MG tablet TAKE 1 TABLET BY MOUTH EVERY MORNING. 10/05/16   Birdie Sons, MD  nitroGLYCERIN (NITROSTAT) 0.4 MG SL tablet Place 1 tablet (0.4 mg total) under the tongue every 5 (five) minutes as needed for chest pain. Reported on 05/26/2016 10/06/16   Birdie Sons, MD  omega-3 acid ethyl esters (LOVAZA) 1 g capsule TAKE FOUR CAPSULES BY MOUTH DAILY 06/04/17   Birdie Sons, MD  oxyCODONE-acetaminophen (PERCOCET) 10-325 MG tablet Take 1 tablet by mouth every 4 (four) hours as needed. 05/20/17   Birdie Sons, MD  potassium chloride SA (K-DUR,KLOR-CON) 20 MEQ tablet Take 1 tablet (20 mEq total) by mouth daily. And an additional 20mq in the afternoon when taking  the metolazone 03/05/17 06/03/17  HDarylene PriceA, FNP  RANEXA 500 MG 12 hr tablet Take 1,000 mg by mouth 2 (two) times daily.    [provider]  sotalol (BETAPACE) 120 MG tablet Take 120 mg by mouth 2 (two) times daily.     [provider]  SPIRIVA HANDIHALER 18 MCG inhalation capsule INHALE 1 CAPSULE VIA INHALER ONCE A DAY 01/25/17   FBirdie Sons MD  sucralfate (CARAFATE) 1 g tablet Take 1 tablet (1 g total) by mouth 2 (two) times daily. 05/03/17   WLoney Hering MD  tamsulosin (FLOMAX) 0.4 MG CAPS capsule Take 0.4 mg by mouth daily after breakfast.     [provider]  torsemide (DEMADEX) 100 MG tablet Take 50 mg by mouth daily.     [provider]    Allergies Demerol [meperidine]; Prednisone; Sulfa antibiotics; Albuterol sulfate [albuterol]; and Morphine sulfate  Family History  Problem Relation Age of Onset  . Heart attack Mother   . Depression Mother   . Heart disease Mother   . COPD Mother   . Hypertension Mother   . Heart attack Father   . Diabetes Father   . Depression Father   . Heart disease Father   . Cirrhosis Father   . Parkinson's disease Brother     Social History Social History  Substance Use Topics  . Smoking status: Former Smoker    Quit date: 04/22/2013  . Smokeless tobacco: Never Used  . Alcohol use No     Comment: remotely quit alcohol use. Hx of heavy alcohol use.    Review of Systems Constitutional: No fever/chills Eyes: No visual changes. ENT: No sore throat. Cardiovascular: Denies chest pain. Respiratory: Denies shortness of breath. Gastrointestinal: No abdominal pain.  No nausea, no vomiting.  No diarrhea.  No constipation. Genitourinary: Mild dysuria with "bruising" to the glans of his penis Musculoskeletal: Reproducible pain in left side of neck.  Negative for back pain. Integumentary: rash for several weeks on arms, neck, back, and legs Neurological: Negative for headaches, focal weakness or  numbness.   ____________________________________________   PHYSICAL EXAM:  VITAL SIGNS: ED Triage Vitals  Enc Vitals Group     BP 06/04/17  2002 (!) 144/72     Pulse Rate 06/04/17 2002 61     Resp 06/04/17 2002 18     Temp 06/04/17 2002 97.8 F (36.6 C)     Temp Source 06/04/17 2002 Oral     SpO2 06/04/17 2002 96 %     Weight 06/04/17 2003 104.3 kg (230 lb)     Height 06/04/17 2003 1.702 m (5' 7" )     Head Circumference --      Peak Flow --      Pain Score 06/04/17 2002 8     Pain Loc --      Pain Edu? --      Excl. in [place]? --     Constitutional: Alert and oriented. Appears chronically ill but is not in acute distress Eyes: Conjunctivae are normal.  Head: Atraumatic. Nose: No congestion/rhinnorhea. Mouth/Throat: Mucous membranes are moist.  No mucosal involvement. Neck: No stridor.  No meningeal signs.  No carotid bruits Cardiovascular: Normal rate, regular rhythm. Good peripheral circulation. Grossly normal heart sounds. Respiratory: Normal respiratory effort.  No retractions. Lungs CTAB.  Wears oxygen at baseline Gastrointestinal: Soft and nontender. No distention.  Genitourinary: Uncircumcised male.  Erythema and inflammation extensively covering the glans penis consistent with balanitis. Musculoskeletal: No lower extremity tenderness nor edema. No gross deformities of extremities.  Highly reproducible muscle tenderness in the left side of the patient's neck, reproducible both with palpation and with turning his head to the left. Neurologic:  Normal speech and language. No gross focal neurologic deficits are appreciated.  Skin:  Skin is warm, dry and intact.  Nonspecific dermatitis, difficult to appreciate Psychiatric: Mood and affect are normal. Speech and behavior are normal.  ____________________________________________   LABS (all labs ordered are listed, but only abnormal results are displayed)  Labs Reviewed  PROTIME-INR - Abnormal; Notable for the following:         Result Value   Prothrombin Time 15.3 (*)    All other components within normal limits  APTT - Abnormal; Notable for the following:    aPTT 37 (*)    All other components within normal limits  CBC - Abnormal; Notable for the following:    RDW 15.6 (*)    All other components within normal limits  COMPREHENSIVE METABOLIC PANEL - Abnormal; Notable for the following:    Chloride 100 (*)    Glucose, Bld 107 (*)    Creatinine, Ser 1.38 (*)    Calcium 8.8 (*)    ALT 16 (*)    GFR calc non Af Amer 53 (*)    All other components within normal limits  URINALYSIS, COMPLETE (UACMP) WITH MICROSCOPIC - Abnormal; Notable for the following:    Color, Urine YELLOW (*)    APPearance CLEAR (*)    Glucose, UA >=500 (*)    Bacteria, UA RARE (*)    All other components within normal limits  DIFFERENTIAL  TROPONIN I  CBG MONITORING, ED   ____________________________________________  EKG  ED ECG REPORT I, Dontasia Miranda, the attending physician, personally viewed and interpreted this ECG.  Date: 06/05/2017 EKG Time: 20:18 Rate: 57 Rhythm: sinus bradycardia QRS Axis: normal Intervals: normal ST/T Wave abnormalities: normal Narrative Interpretation: unremarkable  ____________________________________________  RADIOLOGY   Ct Head Wo Contrast  Result Date: 06/04/2017 CLINICAL DATA:  Dizzy.  Left-sided neck pain. EXAM: CT HEAD WITHOUT CONTRAST TECHNIQUE: Contiguous axial images were obtained from the base of the skull through the vertex without intravenous contrast. COMPARISON:  None. FINDINGS:  Brain: Brain volume is normal for age. No intracranial hemorrhage, mass effect, or midline shift. No hydrocephalus. The basilar cisterns are patent. No evidence of territorial infarct. No extra-axial or intracranial fluid collection. Vascular: No hyperdense vessel or unexpected calcification. Skull: Normal. Negative for fracture or focal lesion. Sinuses/Orbits: Scattered mucosal thickening of the  ethmoid air cells and left frontal sinus. Mastoid air cells are well-aerated. Visualized orbits are normal. Other: None. IMPRESSION: 1. Mild paranasal sinus inflammation, possibly acute. 2. Otherwise unremarkable noncontrast head CT. Electronically Signed   By: Jeb Levering M.D.   On: 06/04/2017 21:10    ____________________________________________   PROCEDURES  Critical Care performed: No   Procedure(s) performed:   Procedures   ____________________________________________   INITIAL IMPRESSION / ASSESSMENT AND PLAN / ED COURSE  Pertinent labs & imaging results that were available during my care of the patient were reviewed by me and considered in my medical decision making (see chart for details).  No evidence of acute or emergent medical condition.  His lab workup is reassuring and he is in no acute distress.  He has balanitis for which I wrote out specific instructions to make a mixture of clotrimazole and bacitracin and apply twice daily until he follows up with urology.  We discussed the probability of musculoskeletal strain on the left side of his neck and I gave him my usual customary recommendations.  He has no neurological deficits and no evidence of carotid dissection or other acute emergent medical conditions.  His rash is nonspecific and difficult to visualize on exam.  I encouraged him to discuss with his regular doctor but it does not appear to be an acute allergic reaction nor petechiae or other evidence of an emergent medical condition.  He has wife are comfortable with this plan and will follow up as scheduled as an outpatient.  I gave my usual and customary return precautions.         ____________________________________________  FINAL CLINICAL IMPRESSION(S) / ED DIAGNOSES  Final diagnoses:  Balanitis  Neck pain on left side  Rash     MEDICATIONS GIVEN DURING THIS VISIT:  Medications - No data to display   NEW OUTPATIENT MEDICATIONS STARTED DURING  THIS VISIT:  New Prescriptions   No medications on file    Modified Medications   No medications on file    Discontinued Medications   No medications on file     Note:  This document was prepared using Dragon voice recognition software and may include unintentional dictation errors.    Hinda Kehr, MD 06/05/17 [phone]

## 2017-06-05 NOTE — Discharge Instructions (Signed)
Your workup in the Emergency Department today was reassuring.  We believe that you strained the muscles in the left side of your neck and that is the cause of the discomfort.  We recommend that you try heating pad an over-the-counter pain medication as needed.  Follow-up with your regular doctor to discuss additional recommendations.  You have a condition known as balanitis which is an inflammation/infection of the tip of your penis.  You can treated with a combination of over-the-counter medications.  We recommend that you by a tube of clotrimazole 1% antifungal ointment and a tube of bacitracin antibacterial ointment (your pharmacy should be able to help you find these medications).  Mix the two ointments together in equal parts, retract your foreskin, and apply the mixture to the inflammed areas on your penis twice daily.  Follow up with Dr. Erlene Quan or one of her colleagues with urology for a re-examination to make sure the symptoms are improving.    Return to the emergency department if you develop new or worsening symptoms that concern you. Return to the Emergency Department if you develop new or worsening symptoms that concern you.

## 2017-06-05 NOTE — ED Notes (Signed)
Pt. Going home with wife.

## 2017-06-11 DIAGNOSIS — J439 Emphysema, unspecified: Secondary | ICD-10-CM | POA: Diagnosis not present

## 2017-06-11 DIAGNOSIS — G4710 Hypersomnia, unspecified: Secondary | ICD-10-CM | POA: Diagnosis not present

## 2017-06-11 DIAGNOSIS — I10 Essential (primary) hypertension: Secondary | ICD-10-CM | POA: Diagnosis not present

## 2017-06-11 DIAGNOSIS — J449 Chronic obstructive pulmonary disease, unspecified: Secondary | ICD-10-CM | POA: Diagnosis not present

## 2017-06-14 DIAGNOSIS — I2510 Atherosclerotic heart disease of native coronary artery without angina pectoris: Secondary | ICD-10-CM | POA: Diagnosis not present

## 2017-06-14 DIAGNOSIS — I480 Paroxysmal atrial fibrillation: Secondary | ICD-10-CM | POA: Diagnosis not present

## 2017-06-14 DIAGNOSIS — E119 Type 2 diabetes mellitus without complications: Secondary | ICD-10-CM | POA: Diagnosis not present

## 2017-06-14 DIAGNOSIS — I10 Essential (primary) hypertension: Secondary | ICD-10-CM | POA: Diagnosis not present

## 2017-06-17 ENCOUNTER — Other Ambulatory Visit: Payer: Self-pay | Admitting: Family Medicine

## 2017-06-17 MED ORDER — OXYCODONE-ACETAMINOPHEN 10-325 MG PO TABS: 1.0000 | ORAL_TABLET | ORAL | 0 refills | Status: DC | PRN

## 2017-06-17 NOTE — Telephone Encounter (Signed)
Pt contacted office for refill request on the following medications: oxyCODONE-acetaminophen (PERCOCET) 10-325 MG tablet  Last Rx: 05/20/17 Please advise. Thanks TNP

## 2017-06-17 NOTE — Telephone Encounter (Signed)
LOV 05/28/2017.

## 2017-06-18 ENCOUNTER — Emergency Department
Admission: EM | Admit: 2017-06-18 | Discharge: 2017-06-19 | Disposition: A | Discharge status: Home / Self Care | Payer: Medicare HMO | Attending: Emergency Medicine | Admitting: Emergency Medicine

## 2017-06-18 DIAGNOSIS — Z7984 Long term (current) use of oral hypoglycemic drugs: Secondary | ICD-10-CM | POA: Insufficient documentation

## 2017-06-18 DIAGNOSIS — R21 Rash and other nonspecific skin eruption: Secondary | ICD-10-CM | POA: Diagnosis not present

## 2017-06-18 DIAGNOSIS — E1122 Type 2 diabetes mellitus with diabetic chronic kidney disease: Secondary | ICD-10-CM | POA: Insufficient documentation

## 2017-06-18 DIAGNOSIS — N189 Chronic kidney disease, unspecified: Secondary | ICD-10-CM | POA: Diagnosis not present

## 2017-06-18 DIAGNOSIS — J449 Chronic obstructive pulmonary disease, unspecified: Secondary | ICD-10-CM | POA: Insufficient documentation

## 2017-06-18 DIAGNOSIS — E119 Type 2 diabetes mellitus without complications: Secondary | ICD-10-CM | POA: Insufficient documentation

## 2017-06-18 DIAGNOSIS — Z7902 Long term (current) use of antithrombotics/antiplatelets: Secondary | ICD-10-CM | POA: Diagnosis not present

## 2017-06-18 DIAGNOSIS — J45909 Unspecified asthma, uncomplicated: Secondary | ICD-10-CM | POA: Diagnosis not present

## 2017-06-18 DIAGNOSIS — L509 Urticaria, unspecified: Secondary | ICD-10-CM

## 2017-06-18 DIAGNOSIS — Z79899 Other long term (current) drug therapy: Secondary | ICD-10-CM | POA: Insufficient documentation

## 2017-06-18 DIAGNOSIS — Z87891 Personal history of nicotine dependence: Secondary | ICD-10-CM | POA: Diagnosis not present

## 2017-06-18 DIAGNOSIS — I2510 Atherosclerotic heart disease of native coronary artery without angina pectoris: Secondary | ICD-10-CM | POA: Insufficient documentation

## 2017-06-18 DIAGNOSIS — Z7901 Long term (current) use of anticoagulants: Secondary | ICD-10-CM | POA: Insufficient documentation

## 2017-06-18 DIAGNOSIS — I130 Hypertensive heart and chronic kidney disease with heart failure and stage 1 through stage 4 chronic kidney disease, or unspecified chronic kidney disease: Secondary | ICD-10-CM | POA: Insufficient documentation

## 2017-06-18 DIAGNOSIS — I5032 Chronic diastolic (congestive) heart failure: Secondary | ICD-10-CM | POA: Insufficient documentation

## 2017-06-18 NOTE — ED Triage Notes (Addendum)
Reports took a bath with a new soap this evening and now has a rash.  No respiratory distress noted.

## 2017-06-19 DIAGNOSIS — L509 Urticaria, unspecified: Secondary | ICD-10-CM | POA: Diagnosis not present

## 2017-06-19 MED ORDER — DIPHENHYDRAMINE HCL 25 MG PO CAPS
25.0000 mg | ORAL_CAPSULE | Freq: Once | ORAL | Status: AC
  Administered 2017-06-19: 25 mg via ORAL
  Filled 2017-06-19: qty 1

## 2017-06-19 MED ORDER — METHYLPREDNISOLONE SODIUM SUCC 125 MG IJ SOLR
125.0000 mg | Freq: Once | INTRAMUSCULAR | Status: AC
  Administered 2017-06-19: 125 mg via INTRAVENOUS
  Filled 2017-06-19: qty 2

## 2017-06-19 MED ORDER — DIPHENHYDRAMINE HCL 25 MG PO CAPS: 25.0000 mg | ORAL_CAPSULE | ORAL | 0 refills | Status: DC | PRN

## 2017-06-19 MED ORDER — FAMOTIDINE IN NACL 20-0.9 MG/50ML-% IV SOLN
20.0000 mg | Freq: Once | INTRAVENOUS | Status: AC
  Administered 2017-06-19: 20 mg via INTRAVENOUS
  Filled 2017-06-19: qty 50

## 2017-06-19 MED ORDER — HYDROCORTISONE 1 % EX OINT: 1.0000 "application " | TOPICAL_OINTMENT | Freq: Two times a day (BID) | CUTANEOUS | 0 refills | Status: DC

## 2017-06-19 NOTE — ED Notes (Signed)
Pt states rash developing on his face after taking a shower around 2000; pt denies using any new soap, shampoo, or coming in contact with anything unusual. Rash is only present on face and neck.

## 2017-06-19 NOTE — Discharge Instructions (Signed)
Please follow up with your primary care physician

## 2017-06-19 NOTE — ED Provider Notes (Signed)
[facility] Emergency Department Provider Note   ____________________________________________   First MD Initiated Contact with Patient 06/19/17 0013     (approximate)  I have reviewed the triage vital signs and the nursing notes.   HISTORY  Chief Complaint Rash    HPI Lawrence [name] is a 61 y.o. male who comes into the hospital today with a rash on. He reports that he washed it and it turned like this. He states that he has not tried any new soaps or laundry detergent. He has used this soap before and he reports that its for sensitive her delicate skin. The patient took his nightly medication but didn't take any other medication. He reports that the rash on his face itches and hurts. He denies any tongue or lip swelling. The patient states he does have shortness of breath that he has COPD and emphysema and always has shortness of breath. The patient was concerned about this rash that he decided to come into the hospital today and get it checked out.   Past Medical History:  Diagnosis Date  . A-fib ([place])   . Anemia   . Anxiety   . Asthma   . CAD (coronary artery disease)    a. 2002 CABGx2 (LIMA->LAD, VG->VG->OM1);  b. 09/2012 DES->OM;  c. 03/2015 PTCA of LAD [facility]) in setting of atretic LIMA; d. 05/2015 Cath [facility]): nonobs dzs; e. 06/2015 Cath ([facility]): LM nl, LAD 45p/d ISR, 50d, D1/2 small, LCX 50p/d ISR, OM1 70ost, 30 ISR, VG->OM1 50ost, 37m LIMA->LAD 99p/d - atretic, RCA dom, nl; f.cath 10/16: 40-50%(FFR 0.90) pLAD, 75% (FFR 0.77) mLAD s/p PCI/DES, oRCA 40% (FFR0.95)  . Celiac disease   . Chronic diastolic CHF (congestive heart failure) ([place]    a. 06/2009 Echo: EF 60-65%, Gr 1 DD, triv AI, mildly dil LA, nl RV.  .Marland KitchenCOPD (chronic obstructive pulmonary disease) ([place]    a. Chronic bronchitis and emphysema.  .Marland KitchenDysrhythmia   . Essential hypertension   . History of tobacco abuse    a. Quit 2014.  .Marland KitchenPSVT (paroxysmal supraventricular tachycardia) ([place]    a.  10/2012 Noted on Zio Patch.  . Type II diabetes mellitus ([facility]     Patient Active Problem List   Diagnosis Date Noted  . Bilateral flank pain 03/24/2017  . Dyspnea 04/03/2016  . Hypotension 04/03/2016  . Chronic kidney disease 04/03/2016  . Anemia 04/03/2016  . Paroxysmal atrial fibrillation ([place] 12/23/2015  . OSA (obstructive sleep apnea) 12/10/2015  . Left inguinal hernia 11/07/2015  . Anxiety 11/07/2015  . Unstable angina ([place] 10/05/2015  . Back pain with left-sided radiculopathy 09/30/2015  . Nocturnal hypoxia 09/06/2015  . BPH (benign prostatic hyperplasia) 08/01/2015  . Chronic diastolic CHF (congestive heart failure) ([place]   . Angina pectoris ([place]   . Chest pain 07/11/2015  . COPD (chronic obstructive pulmonary disease) ([place] 07/03/2015  . CAD (coronary artery disease) 06/26/2015  . HTN (hypertension) 06/26/2015  . DM (diabetes mellitus) ([place] 06/26/2015  . Achalasia 07/24/2014  . GERD (gastroesophageal reflux disease) 06/07/2014  . Former tobacco use 04/11/2013  . HLD (hyperlipidemia) 04/09/2013    Past Surgical History:  Procedure Laterality Date  . BYPASS GRAFT    . CARDIAC CATHETERIZATION N/A 07/12/2015   rocedure: Left Heart Cath and Cors/Grafts Angiography;  Surgeon: HBelva Crome MD;  [place];  Service: Cardiovascular;  Laterality: N/A;  . CARDIAC CATHETERIZATION Right 10/07/2015   Procedure: Left Heart Cath and Cors/Grafts Angiography;  Surgeon: Dionisio David, MD;  Location: [place] CV LAB;  Service: Cardiovascular;  Laterality: Right;  . CARDIAC CATHETERIZATION N/A 04/06/2016   Procedure: Left Heart Cath and Coronary Angiography;  Surgeon: Yolonda Kida, MD;  Location: [place] CV LAB;  Service: Cardiovascular;  Laterality: N/A;  . CARDIAC CATHETERIZATION  04/06/2016   Procedure: Bypass Graft Angiography;  Surgeon: Yolonda Kida, MD;  Location: [place] CV LAB;  Service: Cardiovascular;;  . CARDIAC  CATHETERIZATION N/A 11/02/2016   Procedure: Left Heart Cath and Cors/Grafts Angiography and possible PCI;  Surgeon: Yolonda Kida, MD;  Location: [place] CV LAB;  Service: Cardiovascular;  Laterality: N/A;  . CARDIAC CATHETERIZATION N/A 11/02/2016   Procedure: Coronary Stent Intervention;  Surgeon: Yolonda Kida, MD;  Location: [place] CV LAB;  Service: Cardiovascular;  Laterality: N/A;  . CHOLECYSTECTOMY    . ESOPHAGEAL DILATION    . TONSILLECTOMY    . VASCULAR SURGERY      Prior to Admission medications   Medication Sig Start Date End Date Taking? Authorizing Provider  albuterol (PROVENTIL HFA;VENTOLIN HFA) 108 (90 Base) MCG/ACT inhaler Inhale 4-6 puffs into the lungs every 4 (four) hours as needed for wheezing or shortness of breath. Reported on 04/08/2016    [provider]  allopurinol (ZYLOPRIM) 100 MG tablet TAKE 3 TABLETS BY MOUTH EVERY DAY. 03/22/17   Birdie Sons, MD  ALPRAZolam Duanne Moron) 1 MG tablet TAKE ONE-HALF TO ONE TABLET BY MOUTH THREE TIMES DAILY AS NEEDED FOR ANXIETY 05/30/17   Birdie Sons, MD  apixaban (ELIQUIS) 5 MG TABS tablet Take 5 mg by mouth 2 (two) times daily.    [provider]  atorvastatin (LIPITOR) 80 MG tablet TAKE 1 TABLET BY MOUTH EVERY NIGHT AT BEDTIME. 05/26/16   Birdie Sons, MD  cetirizine (ZYRTEC) 10 MG tablet TAKE ONE TABLET BY MOUTH AT BEDTIME. 05/12/17   Birdie Sons, MD  clopidogrel (PLAVIX) 75 MG tablet TAKE 1 TABLET BY MOUTH DAILY WITH BREAKFAST 01/25/17   Birdie Sons, MD  cyclobenzaprine (FLEXERIL) 5 MG tablet Take 1-2 tablets (5-10 mg total) by mouth 3 (three) times daily as needed (back pain). 05/28/17   Birdie Sons, MD  diphenhydrAMINE (BENADRYL) 25 mg capsule Take 1 capsule (25 mg total) by mouth every 4 (four) hours as needed. 06/19/17 06/19/18  [name], MD  docusate sodium (COLACE) 100 MG capsule Take 100 mg by mouth daily.     [provider]  empagliflozin (JARDIANCE) 10  MG TABS tablet Take 10 mg by mouth daily. 03/05/17   Birdie Sons, MD  famotidine (PEPCID) 40 MG tablet Take 1 tablet (40 mg total) by mouth every evening. 05/12/17 05/12/18  Birdie Sons, MD  fluticasone furoate-vilanterol (BREO ELLIPTA) 100-25 MCG/INH AEPB Inhale 1 puff into the lungs daily.     [provider]  glucose blood (ACCU-CHEK AVIVA PLUS) test strip Use to check blood sugar once a day 11/26/16   Birdie Sons, MD  hydrocortisone 1 % ointment Apply 1 application topically 2 (two) times daily. Apply to rash 06/19/17   [name], MD  isosorbide mononitrate (IMDUR) 60 MG 24 hr tablet Take 1 tablet (60 mg total) by mouth daily. Patient taking differently: Take 60 mg by mouth 2 (two) times daily.  11/04/16   Henreitta Leber, MD  lansoprazole (PREVACID) 30 MG capsule Take 30 mg by mouth 2 (two) times daily.     [provider]  lisinopril (PRINIVIL,ZESTRIL) 2.5 MG tablet Take 1 tablet (2.5 mg total) by mouth daily. 02/19/17   Alisa Graff, FNP  Magnesium Oxide 400 (240 Mg) MG TABS TAKE ONE TABLET BY MOUTH DAILY 03/23/17   Birdie Sons, MD  metFORMIN (GLUCOPHAGE) 500 MG tablet TAKE 1 TABLET BY MOUTH EVERY MORNING. 10/05/16   Birdie Sons, MD  nitroGLYCERIN (NITROSTAT) 0.4 MG SL tablet Place 1 tablet (0.4 mg total) under the tongue every 5 (five) minutes as needed for chest pain. Reported on 05/26/2016 10/06/16   Birdie Sons, MD  omega-3 acid ethyl esters (LOVAZA) 1 g capsule TAKE FOUR CAPSULES BY MOUTH DAILY 06/04/17   Birdie Sons, MD  oxyCODONE-acetaminophen (PERCOCET) 10-325 MG tablet Take 1 tablet by mouth every 4 (four) hours as needed. 06/17/17   Birdie Sons, MD  potassium chloride SA (K-DUR,KLOR-CON) 20 MEQ tablet Take 1 tablet (20 mEq total) by mouth daily. And an additional 38mq in the afternoon when taking the metolazone 03/05/17 06/03/17  HDarylene PriceA, FNP  RANEXA 500 MG 12 hr tablet Take 1,000 mg by mouth 2 (two) times daily.     [provider]  sotalol (BETAPACE) 120 MG tablet Take 120 mg by mouth 2 (two) times daily.     [provider]  SPIRIVA HANDIHALER 18 MCG inhalation capsule INHALE 1 CAPSULE VIA INHALER ONCE A DAY 01/25/17   FBirdie Sons MD  sucralfate (CARAFATE) 1 g tablet Take 1 tablet (1 g total) by mouth 2 (two) times daily. 05/03/17   WLoney Hering MD  tamsulosin (FLOMAX) 0.4 MG CAPS capsule Take 0.4 mg by mouth daily after breakfast.     [provider]  torsemide (DEMADEX) 100 MG tablet Take 50 mg by mouth daily.     [provider]    Allergies Demerol [meperidine]; Prednisone; Sulfa antibiotics; Albuterol sulfate [albuterol]; and Morphine sulfate  Family History  Problem Relation Age of Onset  . Heart attack Mother   . Depression Mother   . Heart disease Mother   . COPD Mother   . Hypertension Mother   . Heart attack Father   . Diabetes Father   . Depression Father   . Heart disease Father   . Cirrhosis Father   . Parkinson's disease Brother     Social History Social History  Substance Use Topics  . Smoking status: Former Smoker    Quit date: 04/22/2013  . Smokeless tobacco: Never Used  . Alcohol use No     Comment: remotely quit alcohol use. Hx of heavy alcohol use.    Review of Systems  Constitutional: No fever/chills Eyes: No visual changes. ENT: No sore throat. Cardiovascular: Denies chest pain. Respiratory:  shortness of breath. Gastrointestinal: No abdominal pain.  No nausea, no vomiting.  No diarrhea.  No constipation. Genitourinary: Negative for dysuria. Musculoskeletal: Negative for back pain. Skin: Facial rash Neurological: Negative for headaches, focal weakness or numbness.   ____________________________________________   PHYSICAL EXAM:  VITAL SIGNS: ED Triage Vitals [06/18/17 2231]  Enc Vitals Group     BP 108/65     Pulse Rate (!) 57     Resp 20     Temp 98.4 F (36.9 C)     Temp Source Oral     SpO2 94 %      Weight      Height      Head Circumference      Peak Flow  Pain Score      Pain Loc      Pain Edu?      Excl. in [place]?     Constitutional: Alert and oriented. Well appearing and in Mild distress. Eyes: Conjunctivae are normal. PERRL. EOMI. Head: Atraumatic. Nose: No congestion/rhinnorhea. Mouth/Throat: Mucous membranes are moist.  Oropharynx non-erythematous. No mucosal rash noted. Cardiovascular: Normal rate, regular rhythm. Grossly normal heart sounds.  Good peripheral circulation. Respiratory: Normal respiratory effort.  No retractions. Lungs CTAB. Gastrointestinal: Soft and nontender. No distention. Positive bowel sounds Musculoskeletal: No lower extremity tenderness nor edema.   Neurologic:  Normal speech and language.  Skin:  Skin is warm, dry and intact. Maculopapular rash noted to the patient's face that is slightly raised and erythematous but blanching. Psychiatric: Mood and affect are normal.   ____________________________________________   LABS (all labs ordered are listed, but only abnormal results are displayed)  Labs Reviewed - No data to display ____________________________________________  EKG  none ____________________________________________  RADIOLOGY  No results found.  ____________________________________________   PROCEDURES  Procedure(s) performed: None  Procedures  Critical Care performed: No  ____________________________________________   INITIAL IMPRESSION / ASSESSMENT AND PLAN / ED COURSE  Pertinent labs & imaging results that were available during my care of the patient were reviewed by me and considered in my medical decision making (see chart for details).  This is a 61 year old male who comes into the hospital today with a rash to his face. The patient is allergic to prednisone so I will give him a dose of Solu-Medrol, Pepcid and Benadryl. I will reassess the patient once he received his medications. Not in any  respiratory distress.    The patient received all of his medications. His rash has not gotten any worse at this time. He also has no swelling and he is in no acute distress. I will discharge the patient home with some medication and have him follow-up with his primary care physician. I have encouraged him noted to no longer use the soap which she used tonight prior to this rash presenting.  ____________________________________________   FINAL CLINICAL IMPRESSION(S) / ED DIAGNOSES  Final diagnoses:  Hives  Rash      NEW MEDICATIONS STARTED DURING THIS VISIT:  New Prescriptions   DIPHENHYDRAMINE (BENADRYL) 25 MG CAPSULE    Take 1 capsule (25 mg total) by mouth every 4 (four) hours as needed.   HYDROCORTISONE 1 % OINTMENT    Apply 1 application topically 2 (two) times daily. Apply to rash     Note:  This document was prepared using Dragon voice recognition software and may include unintentional dictation errors.    [name], MD 06/19/17 [phone]

## 2017-06-19 NOTE — ED Notes (Signed)
Pt states he wears 2L O2 at home, but forgot to bring his machine in with him. Pt put on 2L via [place] in room.

## 2017-07-12 DIAGNOSIS — G4710 Hypersomnia, unspecified: Secondary | ICD-10-CM | POA: Diagnosis not present

## 2017-07-12 DIAGNOSIS — J449 Chronic obstructive pulmonary disease, unspecified: Secondary | ICD-10-CM | POA: Diagnosis not present

## 2017-07-12 DIAGNOSIS — I10 Essential (primary) hypertension: Secondary | ICD-10-CM | POA: Diagnosis not present

## 2017-07-12 DIAGNOSIS — J439 Emphysema, unspecified: Secondary | ICD-10-CM | POA: Diagnosis not present

## 2017-07-14 ENCOUNTER — Other Ambulatory Visit: Payer: Self-pay | Admitting: Family Medicine

## 2017-07-15 ENCOUNTER — Other Ambulatory Visit: Payer: Self-pay | Admitting: Family Medicine

## 2017-07-15 MED ORDER — OXYCODONE-ACETAMINOPHEN 10-325 MG PO TABS: 1.0000 | ORAL_TABLET | ORAL | 0 refills | Status: DC | PRN

## 2017-07-15 NOTE — Telephone Encounter (Signed)
Pt needs refill on  his oxycodone 10-325  Please call when ready [phone]  Thanks teri

## 2017-07-24 IMAGING — CR DG CHEST 2V
1 series · 3 of 3 positions shown · non-contrast
Comparison: Chest x-ray 07/31/2015.

CLINICAL DATA: 59-year-old male with lower extremity swelling for
the past 24 hours. Left leg numbness. Chest pain intermittently with
some shortness breath today.

EXAM:
CHEST  2 VIEW

[Series 1: dg chest 2 view · 0.14mm/px · 3 of 3 slices shown]
[im 1/3]
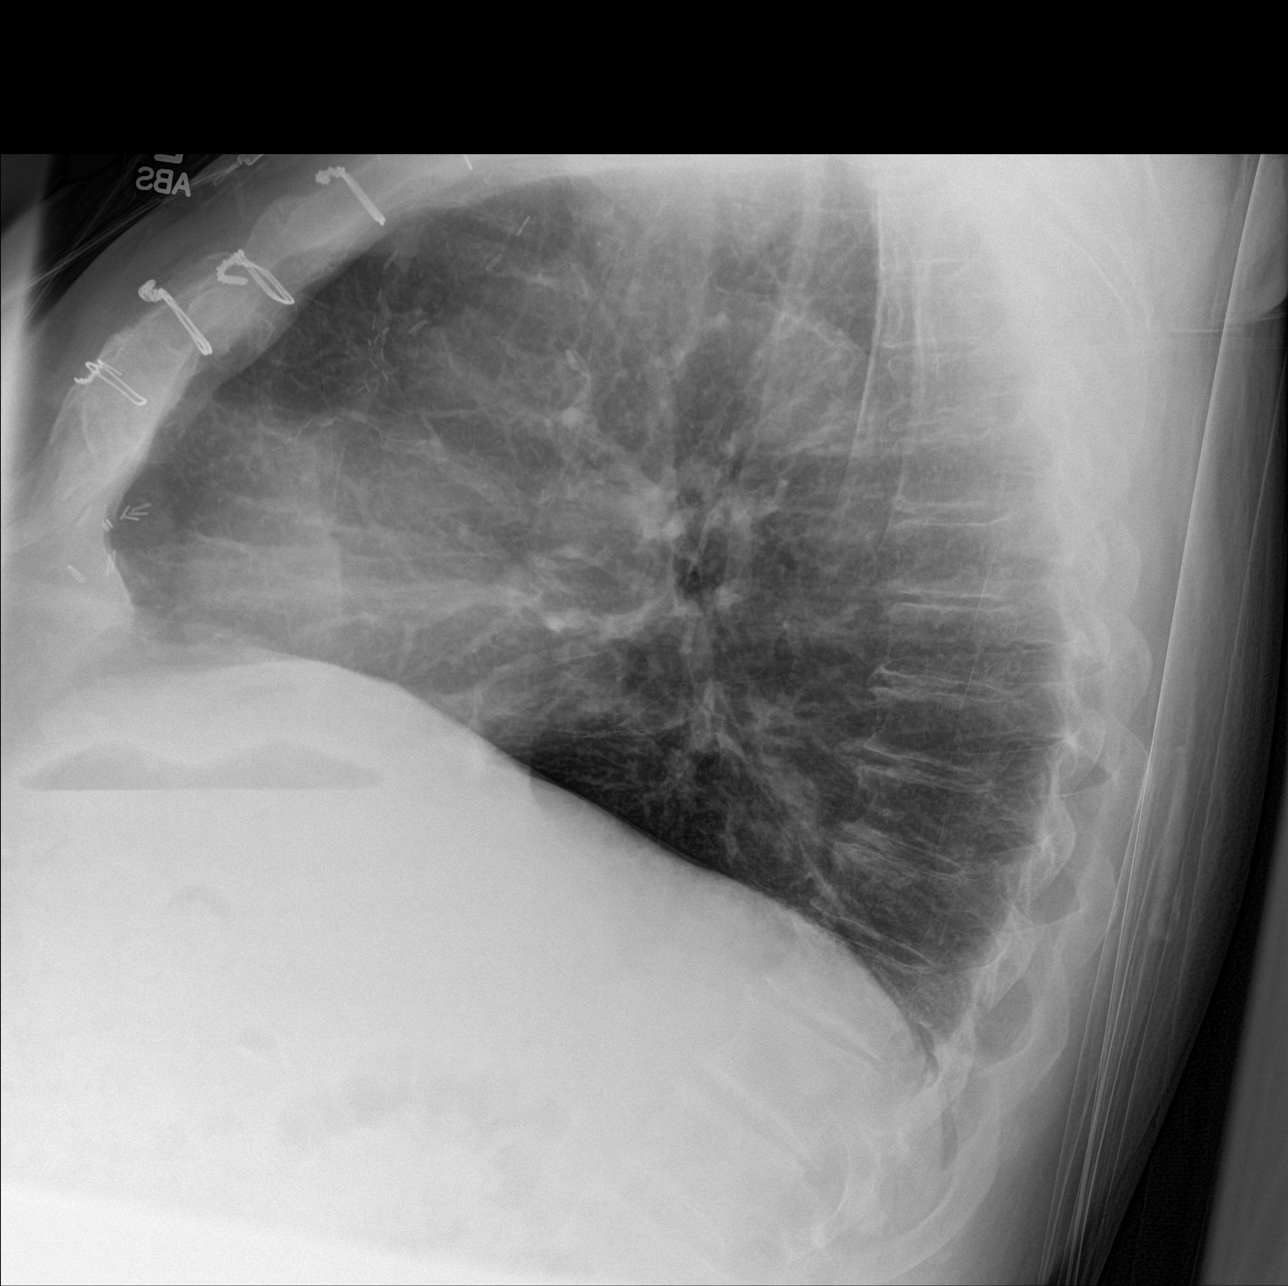
[im 2/3]
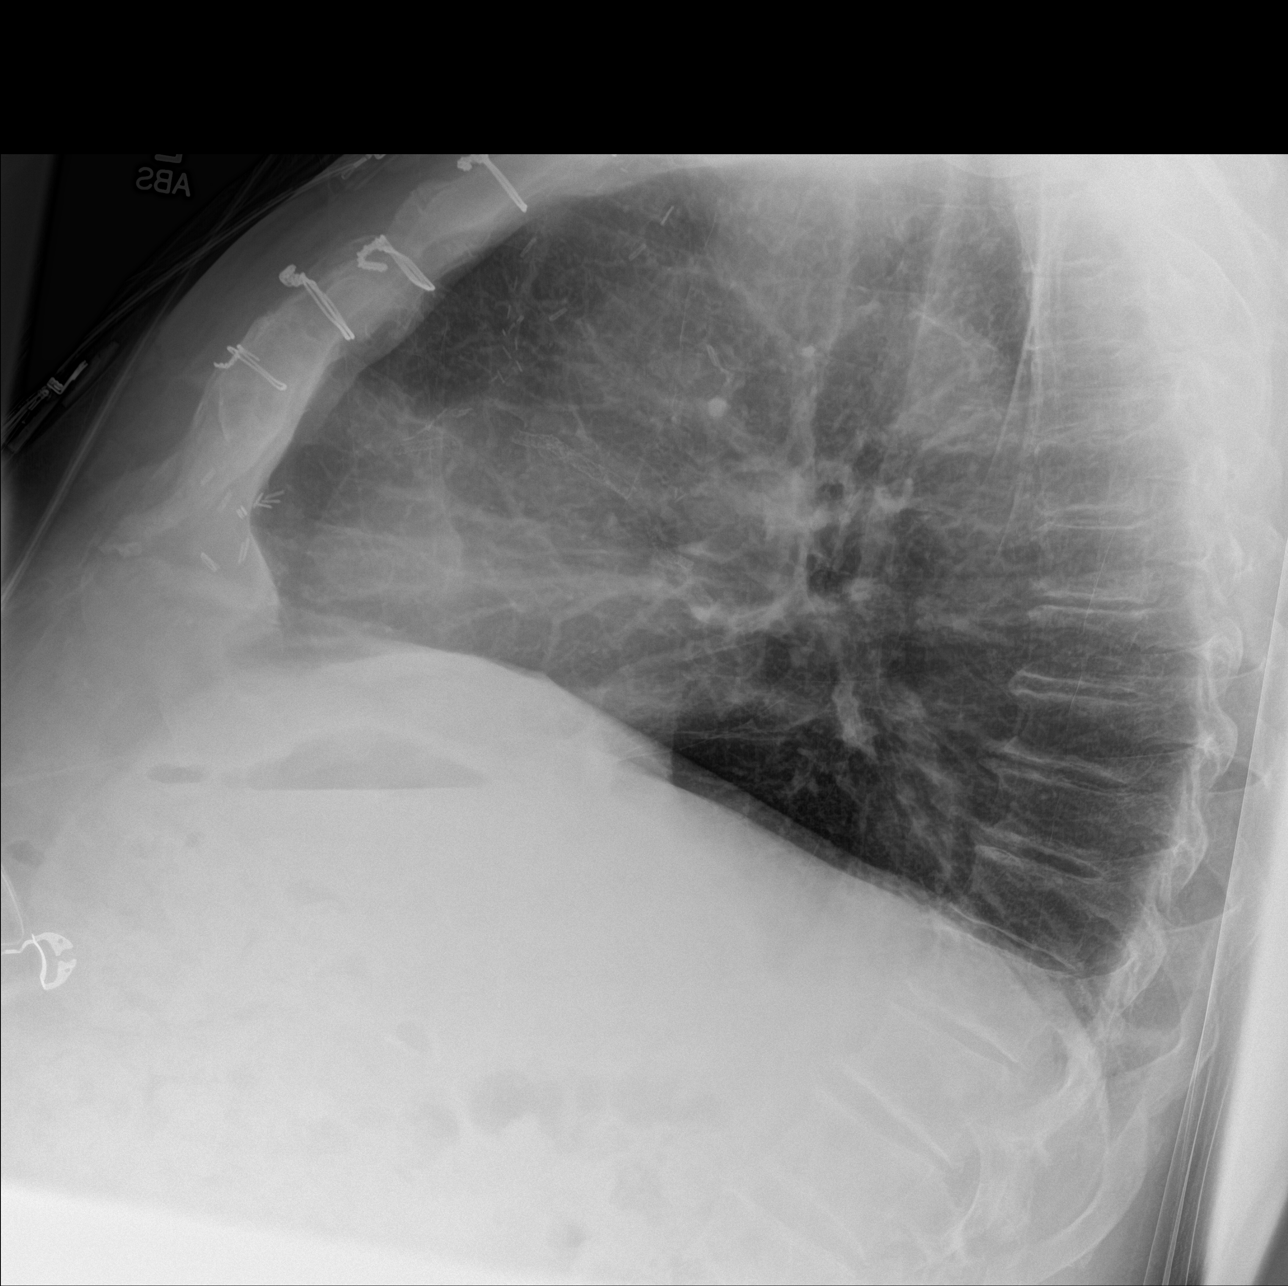
[im 3/3]
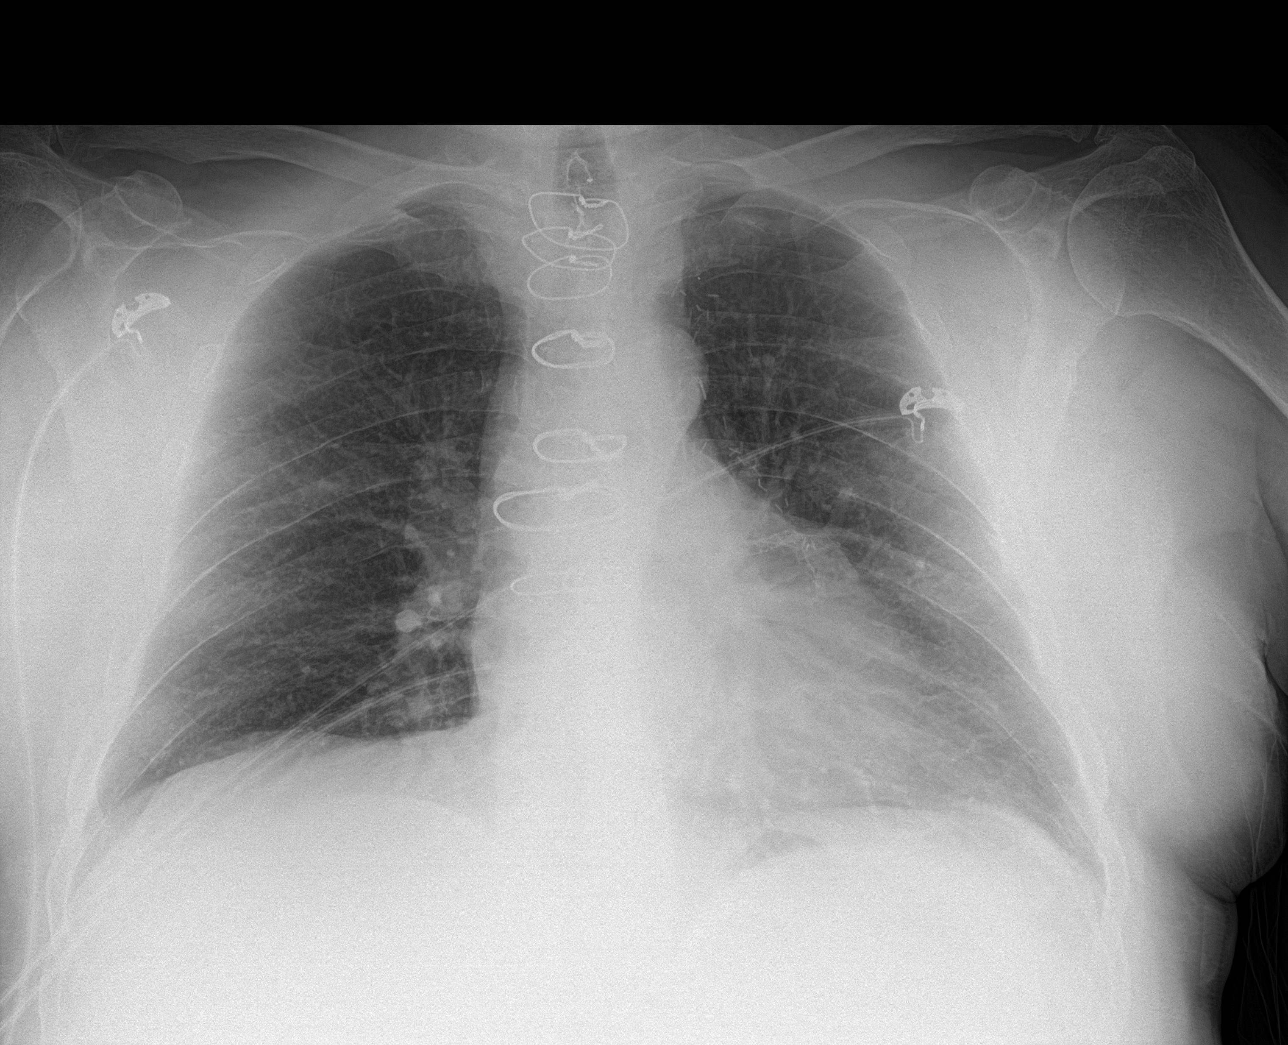

[3 of 3 positions shown; findings below may reference images not displayed]

FINDINGS: Lung volumes are normal. No consolidative airspace disease. No
pleural effusions. No pneumothorax. No pulmonary nodule or mass
noted. Pulmonary vasculature and the cardiomediastinal silhouette
are within normal limits. Atherosclerosis in the thoracic aorta.
Status post median sternotomy for CABG. Multiple coronary artery
stents are noted.
IMPRESSION: 1. No radiographic evidence of acute cardiopulmonary disease.
2. Atherosclerosis.

## 2017-07-28 ENCOUNTER — Other Ambulatory Visit: Payer: Self-pay | Admitting: Family Medicine

## 2017-08-11 IMAGING — CR DG LUMBAR SPINE COMPLETE 4+V
1 series · 5 of 5 positions shown · non-contrast
Comparison: CT 05/25/2015

CLINICAL DATA: Degenerative disc disease. Burning and tingling pain
down left leg 2 weeks.

EXAM:
LUMBAR SPINE - COMPLETE 4+ VIEW

[Series 1: dg lumbar spine complete 4 +v · 0.14mm/px · 5 of 5 slices shown]
[im 1/5]
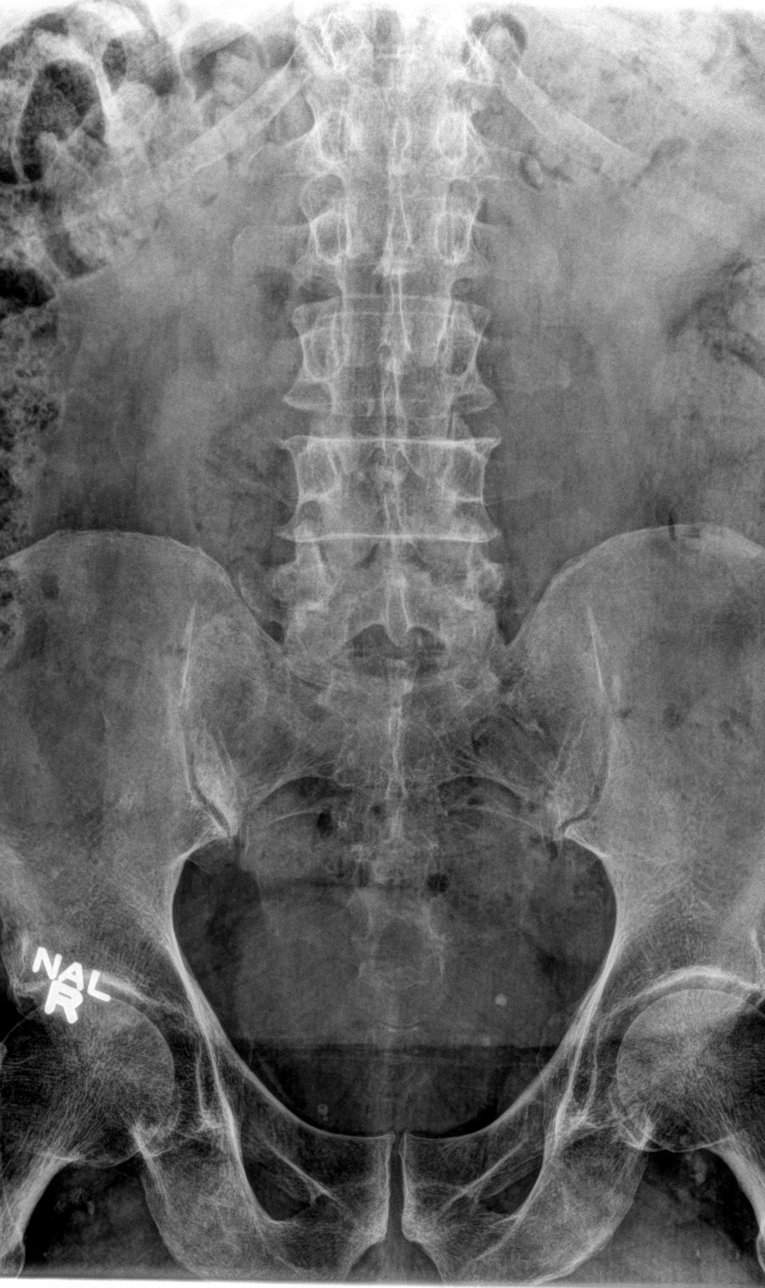
[im 2/5]
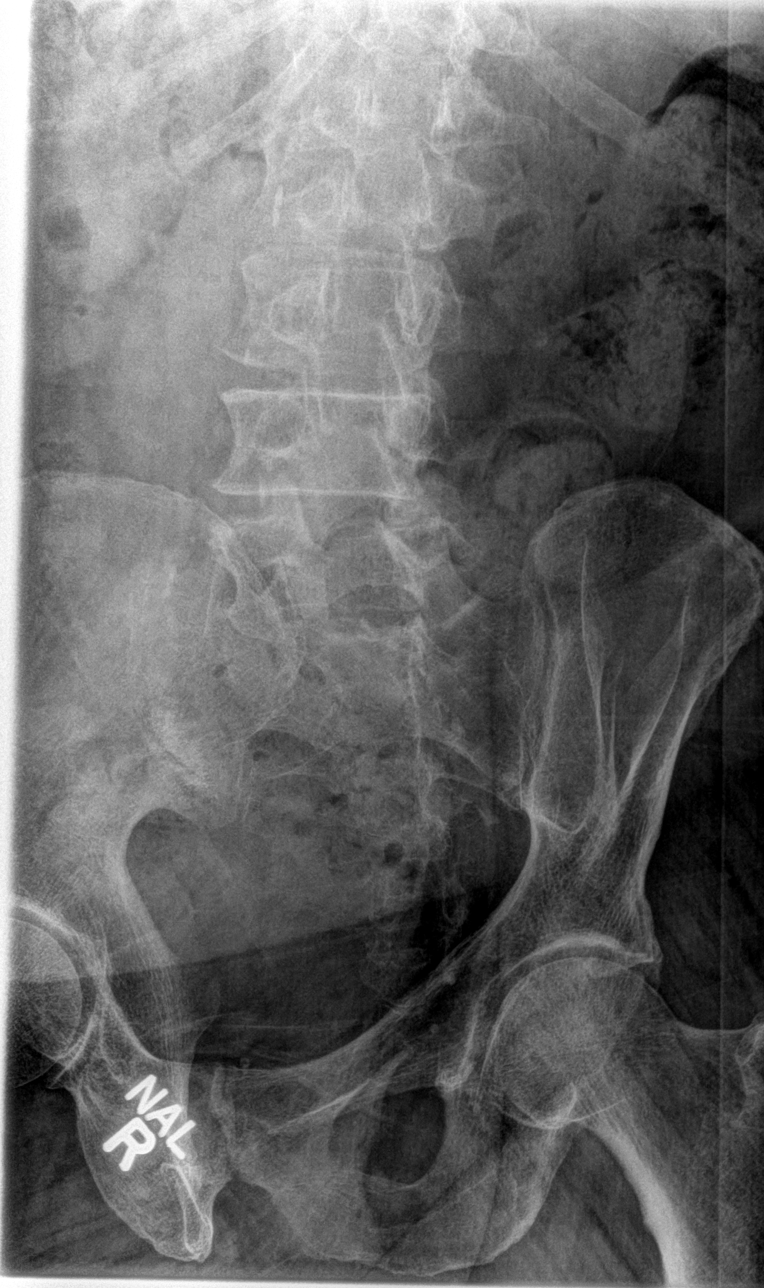
[im 3/5]
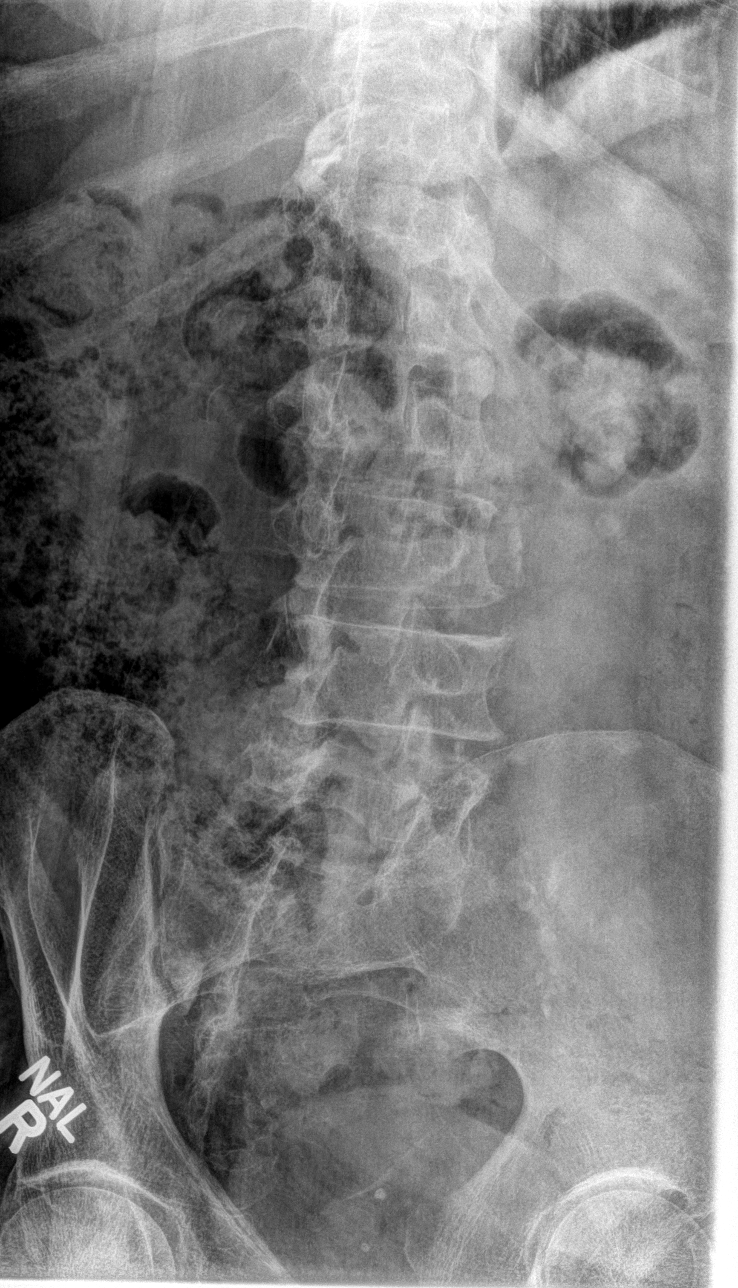
[im 4/5]
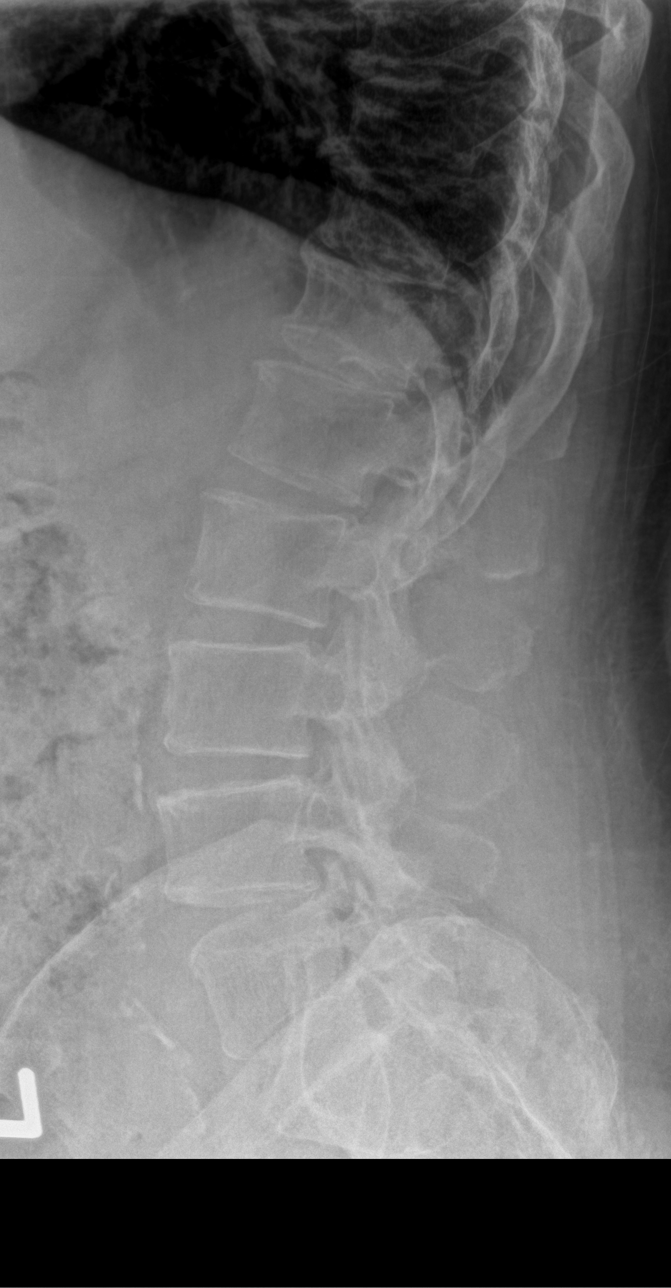
[im 5/5]
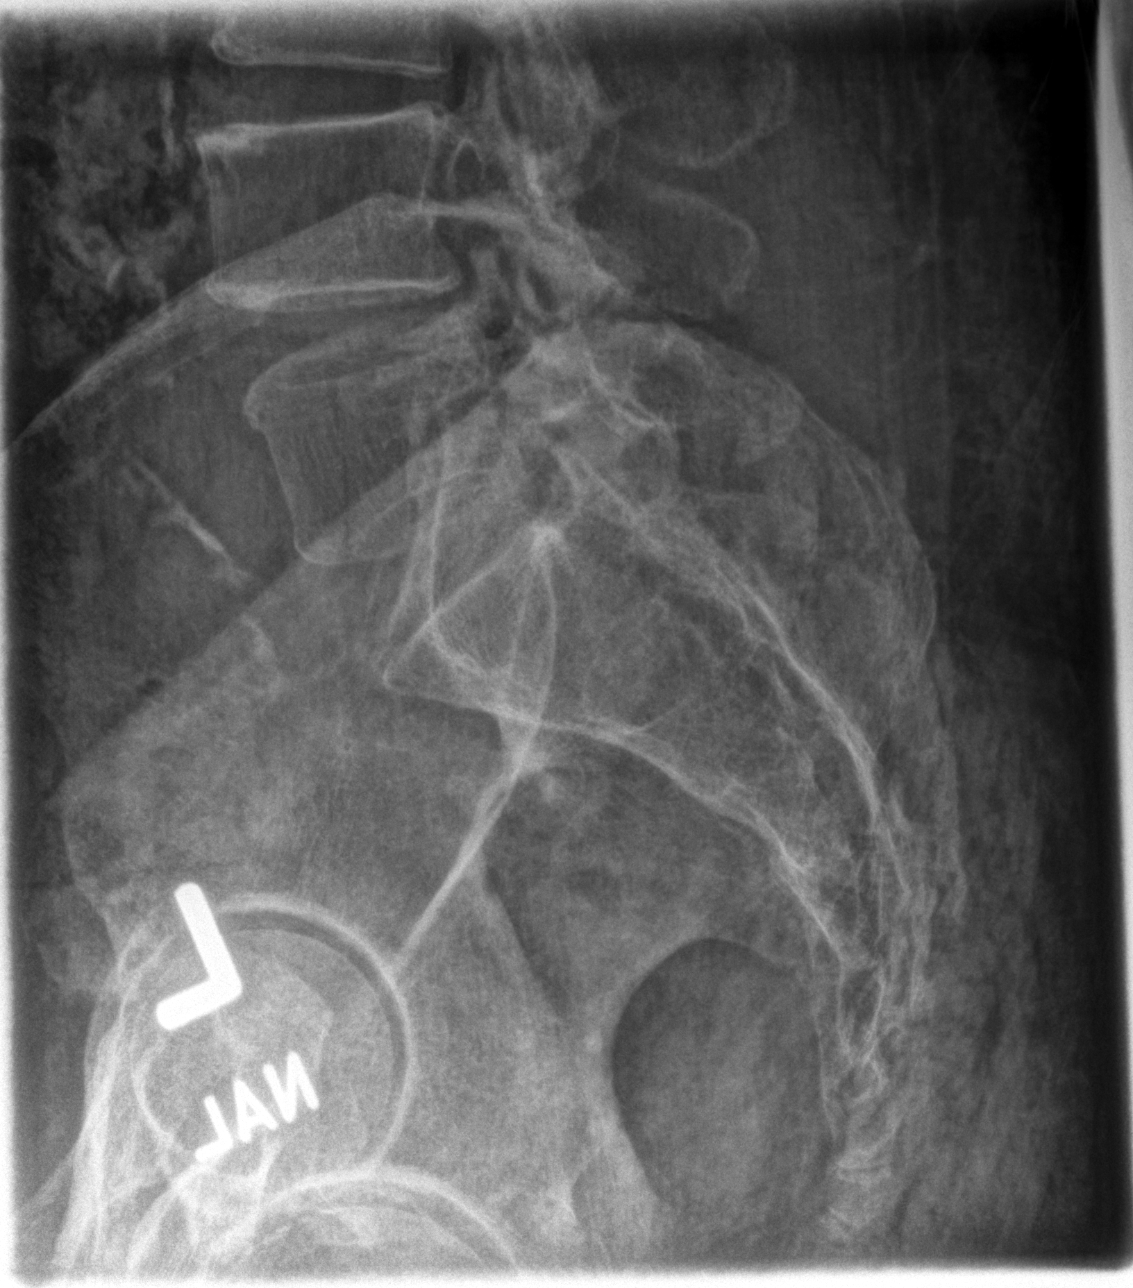

[5 of 5 positions shown; findings below may reference images not displayed]

FINDINGS: There is mild diffuse decreased bone mineralization. Mild
spondylosis throughout the lumbar spine and visualized lower
thoracic spine. Vertebral body alignment is within normal. No
significant disc space narrowing. Mild facet arthropathy. No
evidence of compression fracture subluxation within the lumbar
spine. Mild symmetric degenerative change of the hips.
IMPRESSION: Spondylosis of the lumbar spine without significant disc space
narrowing. No compression fracture or subluxation.

## 2017-08-12 ENCOUNTER — Other Ambulatory Visit: Payer: Self-pay | Admitting: Family Medicine

## 2017-08-12 DIAGNOSIS — J449 Chronic obstructive pulmonary disease, unspecified: Secondary | ICD-10-CM | POA: Diagnosis not present

## 2017-08-12 DIAGNOSIS — I10 Essential (primary) hypertension: Secondary | ICD-10-CM | POA: Diagnosis not present

## 2017-08-12 DIAGNOSIS — J439 Emphysema, unspecified: Secondary | ICD-10-CM | POA: Diagnosis not present

## 2017-08-12 DIAGNOSIS — G4710 Hypersomnia, unspecified: Secondary | ICD-10-CM | POA: Diagnosis not present

## 2017-08-12 NOTE — Telephone Encounter (Signed)
Pt contacted office for refill request on the following medications:  oxyCODONE-acetaminophen (PERCOCET) 10-325 MG tablet   CB#[phone]/MW

## 2017-08-13 ENCOUNTER — Other Ambulatory Visit: Payer: Self-pay | Admitting: Family

## 2017-08-13 ENCOUNTER — Other Ambulatory Visit: Payer: Self-pay | Admitting: Family Medicine

## 2017-08-13 ENCOUNTER — Telehealth: Payer: Self-pay | Admitting: Family Medicine

## 2017-08-13 MED ORDER — OXYCODONE-ACETAMINOPHEN 10-325 MG PO TABS: 1.0000 | ORAL_TABLET | ORAL | 0 refills | Status: DC | PRN

## 2017-08-13 NOTE — Telephone Encounter (Signed)
error 

## 2017-08-19 DIAGNOSIS — R0602 Shortness of breath: Secondary | ICD-10-CM | POA: Diagnosis not present

## 2017-08-19 DIAGNOSIS — G4733 Obstructive sleep apnea (adult) (pediatric): Secondary | ICD-10-CM | POA: Diagnosis not present

## 2017-08-19 DIAGNOSIS — F17211 Nicotine dependence, cigarettes, in remission: Secondary | ICD-10-CM | POA: Diagnosis not present

## 2017-08-19 DIAGNOSIS — J9611 Chronic respiratory failure with hypoxia: Secondary | ICD-10-CM | POA: Diagnosis not present

## 2017-08-25 DIAGNOSIS — H2513 Age-related nuclear cataract, bilateral: Secondary | ICD-10-CM | POA: Diagnosis not present

## 2017-08-25 DIAGNOSIS — H3563 Retinal hemorrhage, bilateral: Secondary | ICD-10-CM | POA: Diagnosis not present

## 2017-08-25 DIAGNOSIS — E119 Type 2 diabetes mellitus without complications: Secondary | ICD-10-CM | POA: Diagnosis not present

## 2017-08-25 DIAGNOSIS — Z8669 Personal history of other diseases of the nervous system and sense organs: Secondary | ICD-10-CM | POA: Diagnosis not present

## 2017-08-25 LAB — HM DIABETES EYE EXAM

## 2017-08-31 ENCOUNTER — Encounter: Payer: Self-pay | Admitting: Family Medicine

## 2017-08-31 ENCOUNTER — Ambulatory Visit (INDEPENDENT_AMBULATORY_CARE_PROVIDER_SITE_OTHER): Payer: Medicare HMO | Admitting: Family Medicine

## 2017-08-31 VITALS — BP 142/82 | HR 56 | Temp 97.5°F | Resp 16 | Wt 237.0 lb

## 2017-08-31 DIAGNOSIS — E1159 Type 2 diabetes mellitus with other circulatory complications: Secondary | ICD-10-CM

## 2017-08-31 DIAGNOSIS — I10 Essential (primary) hypertension: Secondary | ICD-10-CM | POA: Diagnosis not present

## 2017-08-31 DIAGNOSIS — J410 Simple chronic bronchitis: Secondary | ICD-10-CM | POA: Diagnosis not present

## 2017-08-31 DIAGNOSIS — W5501XA Bitten by cat, initial encounter: Secondary | ICD-10-CM

## 2017-08-31 DIAGNOSIS — I25118 Atherosclerotic heart disease of native coronary artery with other forms of angina pectoris: Secondary | ICD-10-CM

## 2017-08-31 DIAGNOSIS — Z23 Encounter for immunization: Secondary | ICD-10-CM | POA: Diagnosis not present

## 2017-08-31 DIAGNOSIS — G4733 Obstructive sleep apnea (adult) (pediatric): Secondary | ICD-10-CM

## 2017-08-31 DIAGNOSIS — I5032 Chronic diastolic (congestive) heart failure: Secondary | ICD-10-CM | POA: Diagnosis not present

## 2017-08-31 DIAGNOSIS — M5410 Radiculopathy, site unspecified: Secondary | ICD-10-CM | POA: Diagnosis not present

## 2017-08-31 DIAGNOSIS — D229 Melanocytic nevi, unspecified: Secondary | ICD-10-CM

## 2017-08-31 LAB — POCT GLYCOSYLATED HEMOGLOBIN (HGB A1C)
Est. average glucose Bld gHb Est-mCnc: 128
Hemoglobin A1C: 6.1

## 2017-08-31 NOTE — Progress Notes (Signed)
Patient: Lawrence [name] Male    DOB: 1956-05-13   61 y.o.   MRN: [id number] Visit Date: 08/31/2017  Today's Provider: [name], MD   Chief Complaint  Patient presents with  . Hypertension  . Diabetes  . Pain   Subjective:    HPI  Hypertension, follow-up:  BP Readings from Last 3 Encounters:  06/19/17 107/65  06/05/17 125/77  06/02/17 (!) 132/59    He was last seen for hypertension 3 months ago. This problem is being followed by Dr. [name].  He reports good compliance with treatment. He is not having side effects.  He is exercising. He is adherent to low salt diet.   Outside blood pressures are not being checked. He is experiencing chest pain and fatigue.  Patient denies chest pressure/discomfort, claudication, irregular heart beat, lower extremity edema, near-syncope, orthopnea, paroxysmal nocturnal dyspnea, syncope and tachypnea.   Cardiovascular risk factors include advanced age (older than 26 for men, 69 for women), diabetes mellitus, hypertension and male gender.  Use of agents associated with hypertension: NSAIDS.     Weight trend: increasing steadily Wt Readings from Last 3 Encounters:  06/04/17 230 lb (104.3 kg)  06/02/17 228 lb 2 oz (103.5 kg)  05/28/17 232 lb (105.2 kg)    Current diet: in general, a "healthy" diet    ------------------------------------------------------------------------  Diabetes Mellitus Type II, Follow-up:   Lab Results  Component Value Date   HGBA1C 6.3 05/28/2017   HGBA1C 7.4 02/26/2017   HGBA1C 7.1 10/27/2016    Last seen for diabetes 3 months ago.  Management since then includes no changes. He reports good compliance with treatment. He is not having side effects.  Current symptoms include polydipsia and have been stable. Home blood sugar records: fasting range: 110-120  Episodes of hypoglycemia? no   Current Insulin Regimen: none Most Recent Eye Exam: 1 week ago (Dr. Ellin Mayhew) Weight trend:  increasing steadily Prior visit with dietician: no Current diet: in general, a "healthy" diet   Current exercise: walking  Pertinent Labs:    Component Value Date/Time   CHOL 157 10/31/2016 0541   CHOL 146 10/27/2016 1435   CHOL 92 05/23/2014 0415   TRIG 424 (H) 10/31/2016 0541   TRIG 109 05/23/2014 0415   HDL 30 (L) 10/31/2016 0541   HDL 39 (L) 10/27/2016 1435   HDL 28 (L) 05/23/2014 0415   LDLCALC UNABLE TO CALCULATE IF TRIGLYCERIDE OVER 400 mg/dL 10/31/2016 0541   LDLCALC 64 10/27/2016 1435   LDLCALC 42 05/23/2014 0415   CREATININE 1.38 (H) 06/04/2017 2020   CREATININE 0.84 03/07/2015 1851    Wt Readings from Last 3 Encounters:  06/04/17 230 lb (104.3 kg)  06/02/17 228 lb 2 oz (103.5 kg)  05/28/17 232 lb (105.2 kg)    ------------------------------------------------------------------------ Follow up of Chronic Back pain:  Patient was last seen for this problem 3 months ago and no changes were made. Patient reports good compliance with treatment and fair symptom control. He continues on oxycodone/apap which he takes 4-6 times per day and is well tolerated.   He was also recently bitten by his kitten with small puncture wound. No surrounding erythema.     Allergies  Allergen Reactions  . Demerol [Meperidine] Hives  . Prednisone Other (See Comments) and Hypertension    Pt states that this medication puts him in A-fib   . Sulfa Antibiotics Hives  . Albuterol Sulfate [Albuterol] Palpitations and Other (See Comments)    Pt currently  uses this medication.    . Morphine Sulfate Nausea And Vomiting, Rash and Other (See Comments)    Pt states that he is only allergic to the tablet form of this medication.       Current Outpatient Prescriptions:  .  albuterol (PROVENTIL HFA;VENTOLIN HFA) 108 (90 Base) MCG/ACT inhaler, Inhale 4-6 puffs into the lungs every 4 (four) hours as needed for wheezing or shortness of breath. Reported on 04/08/2016, Disp: , Rfl:  .  allopurinol  (ZYLOPRIM) 100 MG tablet, TAKE 3 TABLETS BY MOUTH EVERY DAY., Disp: 90 tablet, Rfl: 4 .  ALPRAZolam (XANAX) 1 MG tablet, TAKE ONE-HALF TO ONE TABLET BY MOUTH THREE TIMES DAILY AS NEEDED FOR ANXIETY, Disp: 90 tablet, Rfl: 5 .  apixaban (ELIQUIS) 5 MG TABS tablet, Take 5 mg by mouth 2 (two) times daily., Disp: , Rfl:  .  aspirin 81 MG chewable tablet, Chew 81 mg by mouth daily., Disp: , Rfl:  .  atorvastatin (LIPITOR) 80 MG tablet, TAKE ONE TABLET BY MOUTH EVERY NIGHT AT BEDTIME, Disp: 90 tablet, Rfl: 4 .  BANOPHEN 25 MG capsule, TAKE 1 CAPSULE (25MG TOTAL) BY MOUTH EVERY 4 (FOUR) HOURS AS NEEDED., Disp: 30 capsule, Rfl: 5 .  cetirizine (ZYRTEC) 10 MG tablet, TAKE ONE TABLET BY MOUTH AT BEDTIME., Disp: 30 tablet, Rfl: 11 .  cyclobenzaprine (FLEXERIL) 5 MG tablet, Take 1-2 tablets (5-10 mg total) by mouth 3 (three) times daily as needed (back pain)., Disp: 60 tablet, Rfl: 5 .  docusate sodium (COLACE) 100 MG capsule, Take 100 mg by mouth daily. , Disp: , Rfl:  .  ELIQUIS 5 MG TABS tablet, TAKE 1 TABLET BY MOUTH TWICE A DAY., Disp: 60 tablet, Rfl: 12 .  famotidine (PEPCID) 40 MG tablet, Take 1 tablet (40 mg total) by mouth every evening., Disp: 30 tablet, Rfl: 11 .  fluticasone furoate-vilanterol (BREO ELLIPTA) 100-25 MCG/INH AEPB, Inhale 1 puff into the lungs daily. , Disp: , Rfl:  .  glucose blood (ACCU-CHEK AVIVA PLUS) test strip, Use to check blood sugar once a day, Disp: 100 each, Rfl: 4 .  hydrocortisone 1 % ointment, Apply 1 application topically 2 (two) times daily. Apply to rash, Disp: 30 g, Rfl: 0 .  isosorbide mononitrate (IMDUR) 60 MG 24 hr tablet, Take 1 tablet (60 mg total) by mouth daily. (Patient taking differently: Take 60 mg by mouth 2 (two) times daily. ), Disp: 60 tablet, Rfl: 1 .  JARDIANCE 10 MG TABS tablet, TAKE 1 TABLET BY MOUTH EVERY DAY, Disp: 30 tablet, Rfl: 12 .  lansoprazole (PREVACID) 30 MG capsule, Take 30 mg by mouth 2 (two) times daily. , Disp: , Rfl:  .  lisinopril  (PRINIVIL,ZESTRIL) 2.5 MG tablet, TAKE 1 TABLET BY MOUTH EVERY DAY, Disp: 30 tablet, Rfl: 5 .  Magnesium Oxide 400 (240 Mg) MG TABS, TAKE ONE TABLET BY MOUTH DAILY, Disp: 30 tablet, Rfl: 12 .  metFORMIN (GLUCOPHAGE) 500 MG tablet, TAKE 1 TABLET BY MOUTH EVERY MORNING., Disp: 90 tablet, Rfl: 4 .  nitroGLYCERIN (NITROSTAT) 0.4 MG SL tablet, Place 1 tablet (0.4 mg total) under the tongue every 5 (five) minutes as needed for chest pain. Reported on 05/26/2016, Disp: 30 tablet, Rfl: 1 .  omega-3 acid ethyl esters (LOVAZA) 1 g capsule, TAKE FOUR CAPSULES BY MOUTH DAILY, Disp: 120 capsule, Rfl: 12 .  oxyCODONE-acetaminophen (PERCOCET) 10-325 MG tablet, Take 1 tablet by mouth every 4 (four) hours as needed., Disp: 150 tablet, Rfl: 0 .  RANEXA 500  MG 12 hr tablet, Take 1,000 mg by mouth 2 (two) times daily., Disp: , Rfl:  .  sotalol (BETAPACE) 120 MG tablet, Take 120 mg by mouth 2 (two) times daily. , Disp: , Rfl:  .  SPIRIVA HANDIHALER 18 MCG inhalation capsule, INHALE 1 CAPSULE VIA INHALER ONCE A DAY, Disp: 30 capsule, Rfl: 12 .  sucralfate (CARAFATE) 1 g tablet, Take 1 tablet (1 g total) by mouth 2 (two) times daily., Disp: 20 tablet, Rfl: 0 .  tamsulosin (FLOMAX) 0.4 MG CAPS capsule, Take 0.4 mg by mouth daily after breakfast. , Disp: , Rfl:  .  torsemide (DEMADEX) 100 MG tablet, Take 50 mg by mouth daily. , Disp: , Rfl:  .  potassium chloride SA (K-DUR,KLOR-CON) 20 MEQ tablet, Take 1 tablet (20 mEq total) by mouth daily. And an additional 69mq in the afternoon when taking the metolazone, Disp: 45 tablet, Rfl: 5  Review of Systems  Constitutional: Positive for fatigue. Negative for appetite change, chills and fever.  Respiratory: Negative for chest tightness, shortness of breath and wheezing.   Cardiovascular: Positive for chest pain. Negative for palpitations.  Gastrointestinal: Negative for abdominal pain, nausea and vomiting.  Endocrine: Positive for polydipsia.  Musculoskeletal: Positive for back  pain.  Skin:       Red skin lesion on lower back     Social History  Substance Use Topics  . Smoking status: Former Smoker    Quit date: 04/22/2013  . Smokeless tobacco: Never Used  . Alcohol use No     Comment: remotely quit alcohol use. Hx of heavy alcohol use.   Objective:   BP (!) 142/82 (BP Location: Left Arm, Patient Position: Sitting, Cuff Size: Large)   Pulse (!) 56   Temp (!) 97.5 F (36.4 C) (Oral)   Resp 16   Wt 237 lb (107.5 kg)   SpO2 98% Comment: 2L 02 nasal canula  BMI 37.12 kg/m  There were no vitals filed for this visit.   Physical Exam   General Appearance:    Alert, cooperative, no distress, obese  Eyes:    PERRL, conjunctiva/corneas clear, EOM's intact       Lungs:     Clear to auscultation bilaterally, respirations unlabored  Heart:    Regular rate and rhythm  Neurologic:   Awake, alert, oriented x 3. No apparent focal neurological           defect.   MS:   Diffuse tenderness lumbar spine and paralumbar muscles.     Results for orders placed or performed in visit on 08/31/17  POCT HgB A1C  Result Value Ref Range   Hemoglobin A1C 6.1    Est. average glucose Bld gHb Est-mCnc 128         Assessment & Plan:     1. Type 2 diabetes mellitus with other circulatory complication, without long-term current use of insulin (HCC) Well controlled.  Continue current medications.   - POCT HgB A1C  2. Coronary artery disease involving native coronary artery of native heart with other form of angina pectoris ([place] Asymptomatic. Compliant with medication.  Continue aggressive risk factor modification.    3. Essential hypertension Well controlled.  Continue current medications.    4. Chronic diastolic CHF (congestive heart failure) (HCC) stable  5. OSA (obstructive sleep apnea) Using CPAP consistently  6. Back pain with left-sided radiculopathy Stable on current regiment of oxycodone/apap .   7. Simple chronic bronchitis ([place]   8. Cat bite,  initial encounter  -  Td : Tetanus/diphtheria >7yo Preservative  free  9. Atypical mole  - Ambulatory referral to Dermatology  Return in about 4 months (around 01/01/2018).       [name], MD  [facility]

## 2017-09-01 IMAGING — CT CT ABD-PELV W/ CM
1 of 3 series · 14 of 32 positions shown, 19 images · IV contrast (omnipaque)
Comparison: CT of the abdomen and pelvis performed 05/25/2015

CLINICAL DATA: Acute onset of diarrhea and vomiting. Sharp stabbing
left lower quadrant abdominal pain. Initial encounter.

EXAM:
CT ABDOMEN AND PELVIS WITH CONTRAST
TECHNIQUE: Multidetector CT imaging of the abdomen and pelvis was performed
using the standard protocol following bolus administration of
intravenous contrast.
CONTRAST:  100mL OMNIPAQUE IOHEXOL 300 MG/ML  SOLN

[Series 2: routine abd pel with · axial · 0.94mm/px · z∈[-544,-120]mm · 14 of 97 slices shown, 19 images]
[im 6/97  soft-tissue]
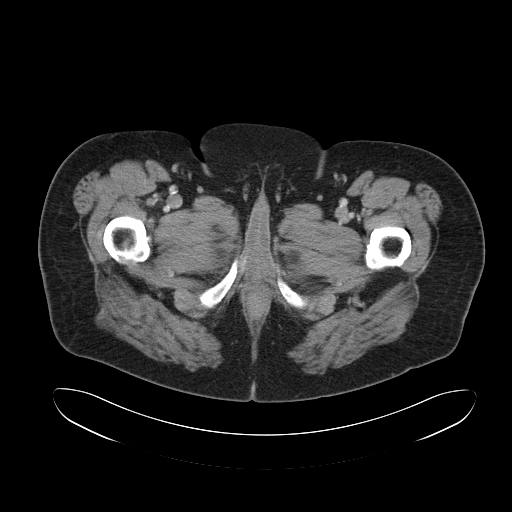
[im 6/97  bone]
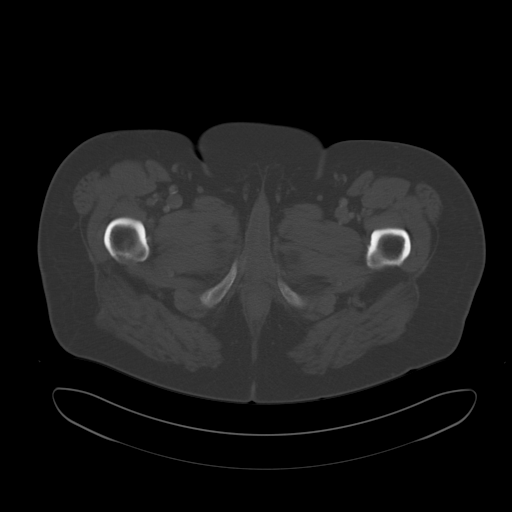
[im 11/97  soft-tissue]
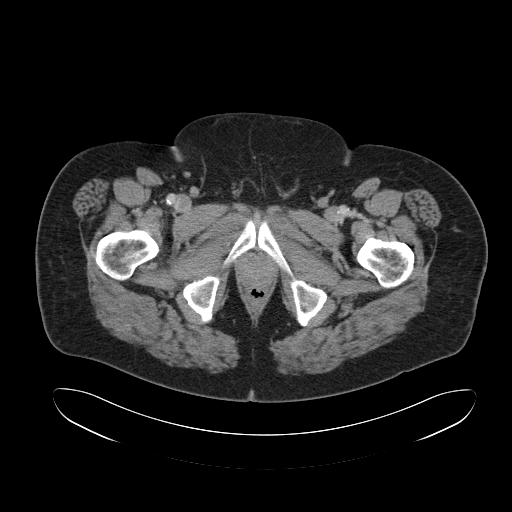
[im 22/97  soft-tissue]
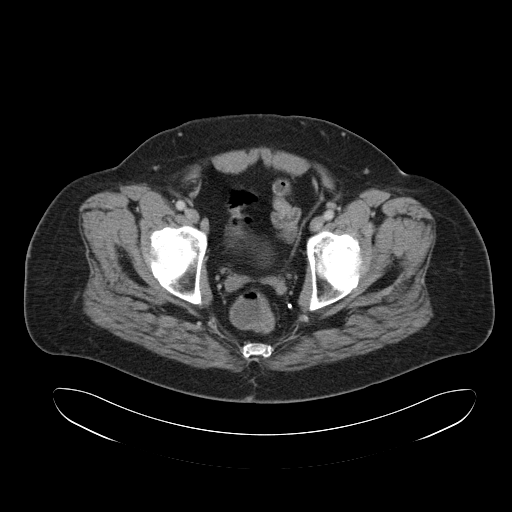
[im 27/97  soft-tissue]
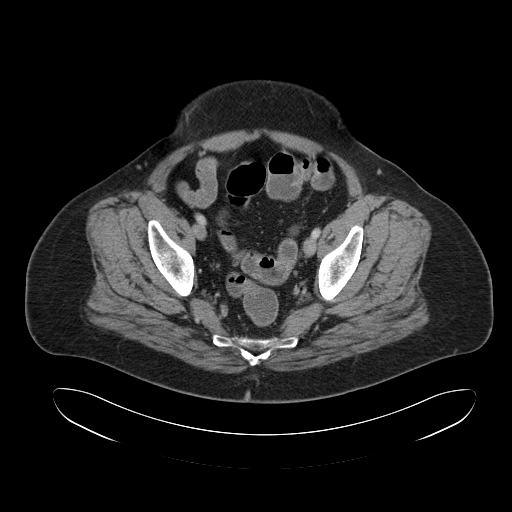
[im 33/97  soft-tissue]
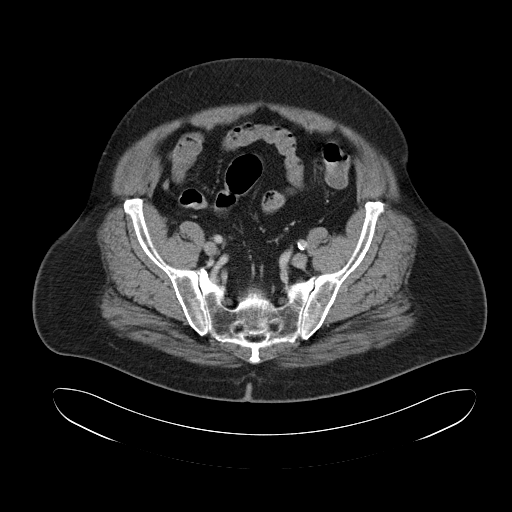
[im 43/97  soft-tissue]
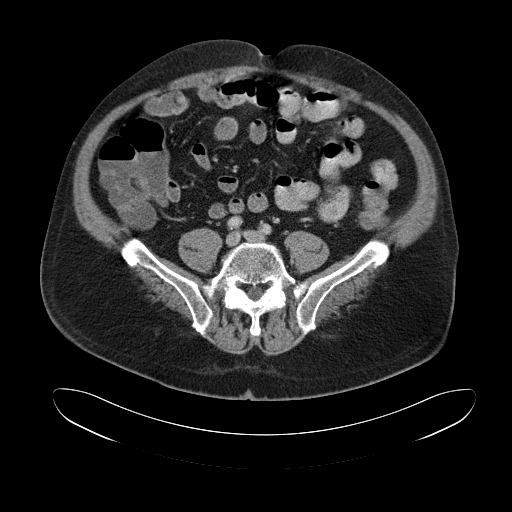
[im 49/97  soft-tissue]
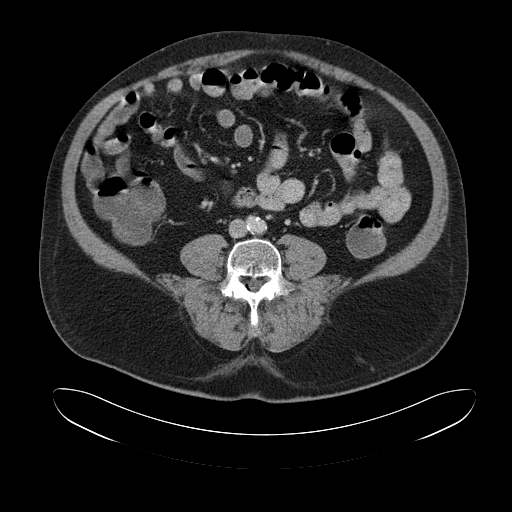
[im 54/97  soft-tissue]
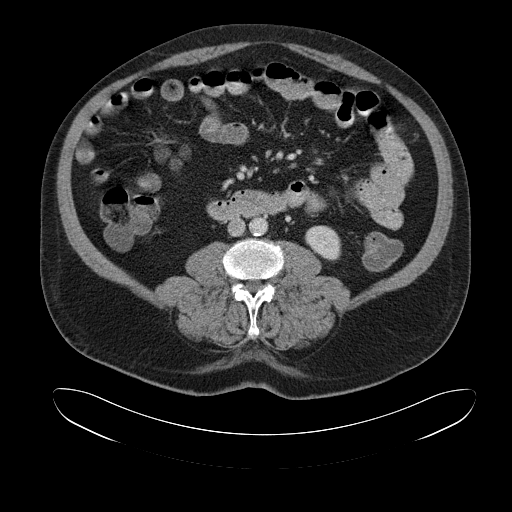
[im 65/97  soft-tissue]
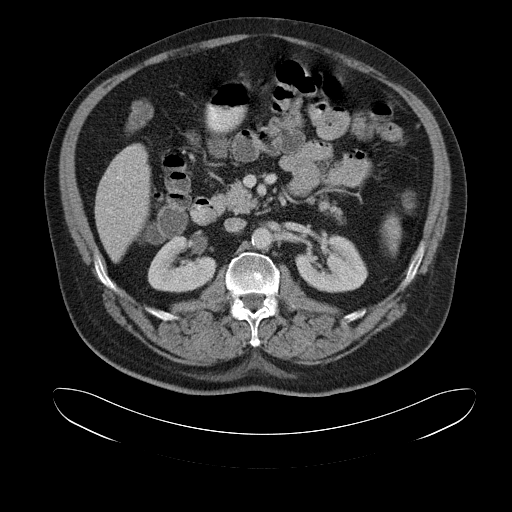
[im 65/97  bone]
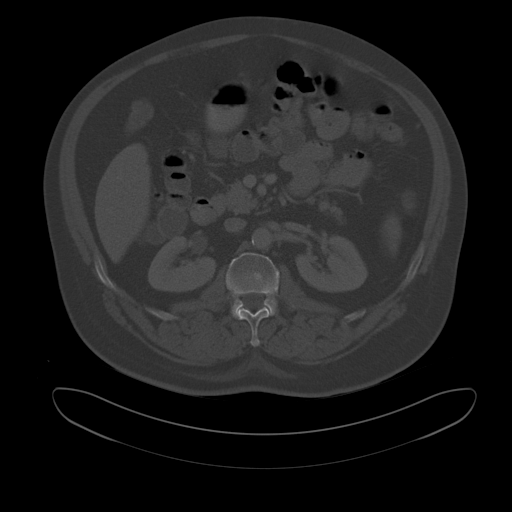
[im 70/97  soft-tissue]
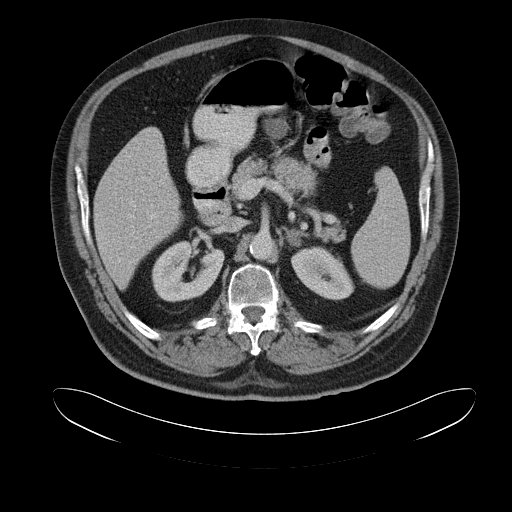
[im 75/97  soft-tissue]
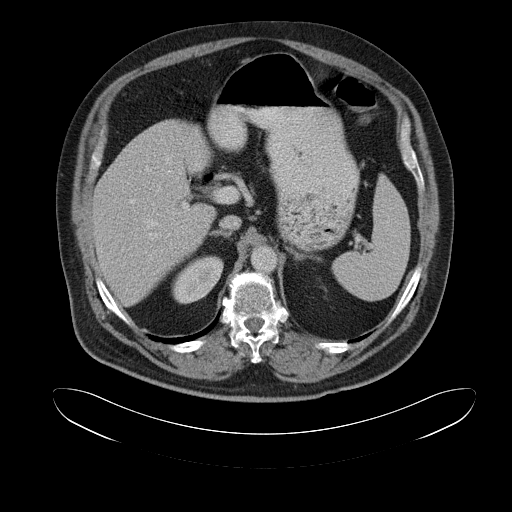
[im 75/97  lung]
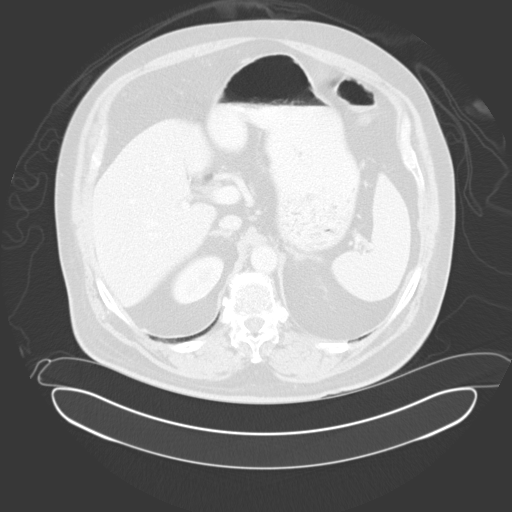
[im 81/97  lung]
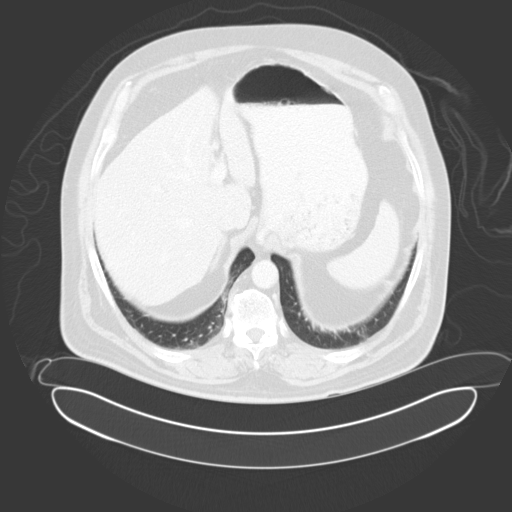
[im 86/97  soft-tissue]
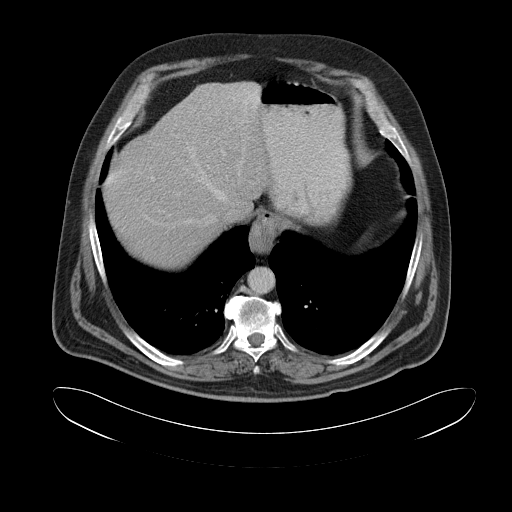
[im 86/97  lung]
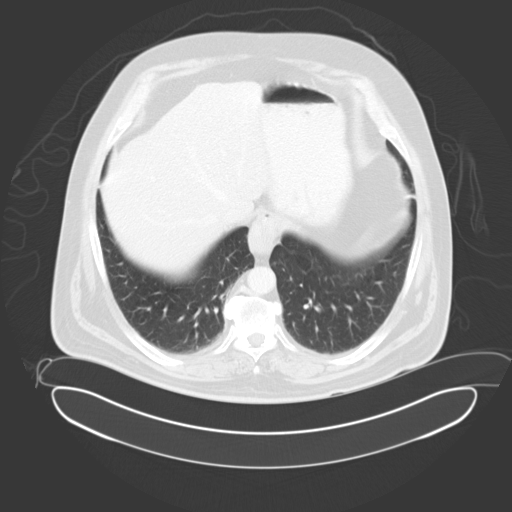
[im 91/97  soft-tissue]
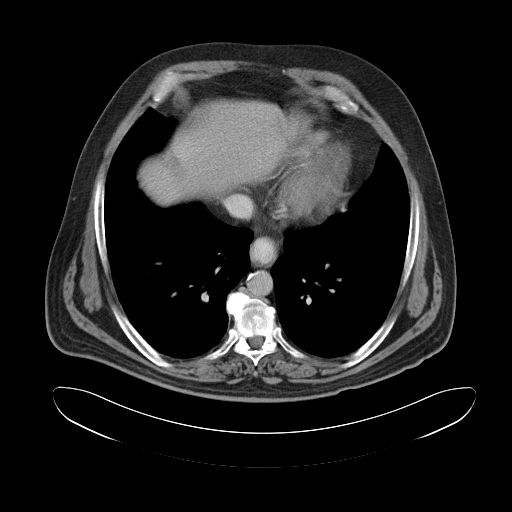
[im 91/97  lung]
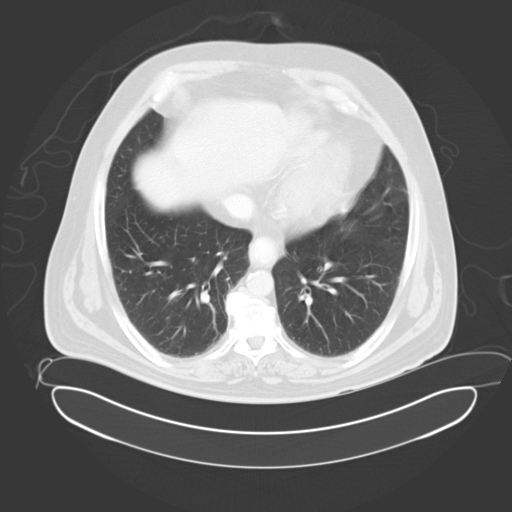

[14 of 32 positions shown; findings below may reference images not displayed]

FINDINGS: The visualized lung bases are clear.

The liver and spleen are unremarkable in appearance. The patient is
status post cholecystectomy, with clips noted at the gallbladder
fossa. The pancreas and adrenal glands are unremarkable.

The kidneys are unremarkable in appearance. There is no evidence of
hydronephrosis. No renal or ureteral stones are seen. No perinephric
stranding is appreciated.

No free fluid is identified. The small bowel is unremarkable in
appearance. The stomach is within normal limits. No acute vascular
abnormalities are seen. Mild scattered calcification is noted along
the abdominal aorta and its branches.

The appendix is normal in caliber, without evidence of appendicitis.
The colon is unremarkable in appearance.

The bladder is mildly distended and grossly unremarkable. The
prostate remains normal in size. No inguinal lymphadenopathy is
seen. A small left inguinal hernia is again noted, containing only
fat.

No acute osseous abnormalities are identified. Subcortical cystic
change is noted at the left-sided facets at L4-L5, similar
appearance to the prior study.
IMPRESSION: 1. No acute abnormality seen within the abdomen or pelvis.
2. Mild scattered calcification along the abdominal aorta and its
branches.
3. Small left inguinal hernia again noted, containing only fat.

## 2017-09-09 ENCOUNTER — Other Ambulatory Visit: Payer: Self-pay | Admitting: Family Medicine

## 2017-09-09 MED ORDER — OXYCODONE-ACETAMINOPHEN 10-325 MG PO TABS: 1.0000 | ORAL_TABLET | ORAL | 0 refills | Status: DC | PRN

## 2017-09-09 NOTE — Telephone Encounter (Signed)
Pt states he is out of his medication and need refill of Oxycodone 10-325.  Pt request a call when ready for pick up.

## 2017-09-09 NOTE — Telephone Encounter (Signed)
Please review. Thanks!  

## 2017-09-11 ENCOUNTER — Other Ambulatory Visit: Payer: Self-pay | Admitting: Family

## 2017-09-11 ENCOUNTER — Other Ambulatory Visit: Payer: Self-pay | Admitting: Family Medicine

## 2017-09-11 DIAGNOSIS — I10 Essential (primary) hypertension: Secondary | ICD-10-CM | POA: Diagnosis not present

## 2017-09-11 DIAGNOSIS — J439 Emphysema, unspecified: Secondary | ICD-10-CM | POA: Diagnosis not present

## 2017-09-11 DIAGNOSIS — J449 Chronic obstructive pulmonary disease, unspecified: Secondary | ICD-10-CM | POA: Diagnosis not present

## 2017-09-11 DIAGNOSIS — G4710 Hypersomnia, unspecified: Secondary | ICD-10-CM | POA: Diagnosis not present

## 2017-09-28 ENCOUNTER — Other Ambulatory Visit: Payer: Self-pay | Admitting: Family Medicine

## 2017-09-28 DIAGNOSIS — F419 Anxiety disorder, unspecified: Secondary | ICD-10-CM

## 2017-09-28 MED ORDER — ALPRAZOLAM 1 MG PO TABS: ORAL_TABLET | ORAL | 5 refills | Status: DC

## 2017-09-28 NOTE — Telephone Encounter (Signed)
Pt contacted office for refill request on the following medications:  ALPRAZolam Duanne Moron) 1 MG tablet   [place].  PW#[phone]

## 2017-09-28 NOTE — Telephone Encounter (Signed)
Called in Rx as below.

## 2017-09-28 NOTE — Telephone Encounter (Signed)
Please call in alprazolam.

## 2017-09-30 ENCOUNTER — Observation Stay
Admission: EM | Admit: 2017-09-30 | Discharge: 2017-10-01 | Disposition: A | Discharge status: Home / Self Care | Payer: Medicare HMO | Attending: Internal Medicine | Admitting: Internal Medicine

## 2017-09-30 ENCOUNTER — Encounter: Payer: Self-pay | Admitting: *Deleted

## 2017-09-30 ENCOUNTER — Other Ambulatory Visit: Payer: Self-pay

## 2017-09-30 ENCOUNTER — Emergency Department: Payer: Medicare HMO

## 2017-09-30 DIAGNOSIS — J449 Chronic obstructive pulmonary disease, unspecified: Secondary | ICD-10-CM | POA: Insufficient documentation

## 2017-09-30 DIAGNOSIS — I4891 Unspecified atrial fibrillation: Secondary | ICD-10-CM | POA: Insufficient documentation

## 2017-09-30 DIAGNOSIS — E119 Type 2 diabetes mellitus without complications: Secondary | ICD-10-CM | POA: Insufficient documentation

## 2017-09-30 DIAGNOSIS — Z7984 Long term (current) use of oral hypoglycemic drugs: Secondary | ICD-10-CM | POA: Diagnosis not present

## 2017-09-30 DIAGNOSIS — Z7901 Long term (current) use of anticoagulants: Secondary | ICD-10-CM | POA: Insufficient documentation

## 2017-09-30 DIAGNOSIS — Z955 Presence of coronary angioplasty implant and graft: Secondary | ICD-10-CM | POA: Diagnosis not present

## 2017-09-30 DIAGNOSIS — Z7982 Long term (current) use of aspirin: Secondary | ICD-10-CM | POA: Diagnosis not present

## 2017-09-30 DIAGNOSIS — Z87891 Personal history of nicotine dependence: Secondary | ICD-10-CM | POA: Insufficient documentation

## 2017-09-30 DIAGNOSIS — I10 Essential (primary) hypertension: Secondary | ICD-10-CM | POA: Diagnosis not present

## 2017-09-30 DIAGNOSIS — E785 Hyperlipidemia, unspecified: Secondary | ICD-10-CM | POA: Diagnosis not present

## 2017-09-30 DIAGNOSIS — I2510 Atherosclerotic heart disease of native coronary artery without angina pectoris: Secondary | ICD-10-CM | POA: Diagnosis present

## 2017-09-30 DIAGNOSIS — F419 Anxiety disorder, unspecified: Secondary | ICD-10-CM | POA: Insufficient documentation

## 2017-09-30 DIAGNOSIS — K900 Celiac disease: Secondary | ICD-10-CM | POA: Diagnosis not present

## 2017-09-30 DIAGNOSIS — I5023 Acute on chronic systolic (congestive) heart failure: Secondary | ICD-10-CM | POA: Diagnosis present

## 2017-09-30 DIAGNOSIS — I110 Hypertensive heart disease with heart failure: Secondary | ICD-10-CM | POA: Diagnosis not present

## 2017-09-30 DIAGNOSIS — Z885 Allergy status to narcotic agent status: Secondary | ICD-10-CM | POA: Insufficient documentation

## 2017-09-30 DIAGNOSIS — R079 Chest pain, unspecified: Secondary | ICD-10-CM | POA: Diagnosis present

## 2017-09-30 DIAGNOSIS — K219 Gastro-esophageal reflux disease without esophagitis: Secondary | ICD-10-CM | POA: Diagnosis not present

## 2017-09-30 DIAGNOSIS — I5032 Chronic diastolic (congestive) heart failure: Secondary | ICD-10-CM | POA: Insufficient documentation

## 2017-09-30 DIAGNOSIS — Z882 Allergy status to sulfonamides status: Secondary | ICD-10-CM | POA: Insufficient documentation

## 2017-09-30 DIAGNOSIS — R0789 Other chest pain: Secondary | ICD-10-CM | POA: Diagnosis not present

## 2017-09-30 DIAGNOSIS — Z888 Allergy status to other drugs, medicaments and biological substances status: Secondary | ICD-10-CM | POA: Diagnosis not present

## 2017-09-30 DIAGNOSIS — I5022 Chronic systolic (congestive) heart failure: Secondary | ICD-10-CM | POA: Diagnosis present

## 2017-09-30 DIAGNOSIS — E1121 Type 2 diabetes mellitus with diabetic nephropathy: Secondary | ICD-10-CM

## 2017-09-30 LAB — BASIC METABOLIC PANEL
Anion gap: 9 (ref 5–15)
BUN: 12 mg/dL (ref 6–20)
CO2: 28 mmol/L (ref 22–32)
Calcium: 9.6 mg/dL (ref 8.9–10.3)
Chloride: 101 mmol/L (ref 101–111)
Creatinine, Ser: 1.07 mg/dL (ref 0.61–1.24)
GFR calc Af Amer: >60 mL/min (ref >60)
GFR calc non Af Amer: >60 mL/min (ref >60)
Glucose, Bld: 113 mg/dL — ABNORMAL HIGH (ref 65–99)
Potassium: 4.6 mmol/L (ref 3.5–5.1)
Sodium: 138 mmol/L (ref 135–145)

## 2017-09-30 LAB — CBC
HCT: 52.8 % — ABNORMAL HIGH (ref 40.0–52.0)
Hemoglobin: 16.8 g/dL (ref 13.0–18.0)
MCH: 28.3 pg (ref 26.0–34.0)
MCHC: 31.7 g/dL — ABNORMAL LOW (ref 32.0–36.0)
MCV: 89 fL (ref 80.0–100.0)
Platelets: 198 10*3/uL (ref 150–440)
RBC: 5.93 MIL/uL — ABNORMAL HIGH (ref 4.40–5.90)
RDW: 17.4 % — ABNORMAL HIGH (ref 11.5–14.5)
WBC: 6.6 10*3/uL (ref 3.8–10.6)

## 2017-09-30 LAB — TROPONIN I: Troponin I: <0.03 ng/mL (ref <0.03)

## 2017-09-30 MED ORDER — MORPHINE SULFATE (PF) 4 MG/ML IV SOLN
4.0000 mg | Freq: Once | INTRAVENOUS | Status: AC
  Administered 2017-09-30: 4 mg via INTRAVENOUS

## 2017-09-30 MED ORDER — NITROGLYCERIN 0.4 MG SL SUBL
0.4000 mg | SUBLINGUAL_TABLET | Freq: Once | SUBLINGUAL | Status: AC
  Administered 2017-09-30: 0.4 mg via SUBLINGUAL

## 2017-09-30 MED ORDER — ONDANSETRON HCL 4 MG/2ML IJ SOLN
INTRAMUSCULAR | Status: AC
  Filled 2017-09-30: qty 2

## 2017-09-30 MED ORDER — ONDANSETRON HCL 4 MG/2ML IJ SOLN
4.0000 mg | Freq: Once | INTRAMUSCULAR | Status: AC
  Administered 2017-09-30: 4 mg via INTRAVENOUS

## 2017-09-30 MED ORDER — MORPHINE SULFATE (PF) 2 MG/ML IV SOLN
2.0000 mg | Freq: Once | INTRAVENOUS | Status: AC
  Administered 2017-09-30: 2 mg via INTRAVENOUS
  Filled 2017-09-30: qty 1

## 2017-09-30 MED ORDER — NITROGLYCERIN 0.4 MG SL SUBL
SUBLINGUAL_TABLET | SUBLINGUAL | Status: AC
  Filled 2017-09-30: qty 1

## 2017-09-30 MED ORDER — NITROGLYCERIN 2 % TD OINT
1.0000 [in_us] | TOPICAL_OINTMENT | Freq: Once | TRANSDERMAL | Status: AC
  Administered 2017-09-30: 1 [in_us] via TOPICAL
  Filled 2017-09-30: qty 1

## 2017-09-30 MED ORDER — MORPHINE SULFATE (PF) 4 MG/ML IV SOLN
INTRAVENOUS | Status: AC
  Filled 2017-09-30: qty 1

## 2017-09-30 MED ORDER — ONDANSETRON HCL 4 MG/2ML IJ SOLN
4.0000 mg | Freq: Once | INTRAMUSCULAR | Status: AC
  Administered 2017-09-30: 4 mg via INTRAVENOUS
  Filled 2017-09-30: qty 2

## 2017-09-30 NOTE — ED Provider Notes (Signed)
[facility] Emergency Department Provider Note  Time seen: 10:28 PM  I have reviewed the triage vital signs and the nursing notes.   HISTORY  Chief Complaint Chest Pain    HPI Lawrence [name] is a 61 y.o. male with a past medical history of anemia, anxiety, asthma, CAD status post bypass status post 4 stents, hypertension, hyperlipidemia, COPD on 2 L of oxygen 24/7 who presents to the emergency department for chest pain.  According to the patient he began experiencing chest pain last night which he states was fairly mild it has progressively worsened today and currently is a 9/10 heaviness feeling in the center of his chest.  States it feels like someone is standing on his chest.  The patient took 3 baby aspirin at home as well as nitroglycerin without relief so he came to the emergency department for evaluation.  Patient states he has not had chest pain since December of last year in which he was admitted for a heart attack.  States the pain he is feeling feels identical to that time along with nausea and shortness of breath.  Denies diaphoresis.  Patient does state shortness of breath somewhat increased over baseline.   Past Medical History:  Diagnosis Date  . A-fib ([place])   . Anemia   . Anxiety   . Asthma   . CAD (coronary artery disease)    a. 2002 CABGx2 (LIMA->LAD, VG->VG->OM1);  b. 09/2012 DES->OM;  c. 03/2015 PTCA of LAD [facility]) in setting of atretic LIMA; d. 05/2015 Cath [facility]): nonobs dzs; e. 06/2015 Cath ([facility]): LM nl, LAD 45p/d ISR, 50d, D1/2 small, LCX 50p/d ISR, OM1 70ost, 30 ISR, VG->OM1 50ost, 77m LIMA->LAD 99p/d - atretic, RCA dom, nl; f.cath 10/16: 40-50%(FFR 0.90) pLAD, 75% (FFR 0.77) mLAD s/p PCI/DES, oRCA 40% (FFR0.95)  . Celiac disease   . Chronic diastolic CHF (congestive heart failure) ([place]    a. 06/2009 Echo: EF 60-65%, Gr 1 DD, triv AI, mildly dil LA, nl RV.  .Marland KitchenCOPD (chronic obstructive pulmonary disease) ([place]    a. Chronic bronchitis and  emphysema.  .Marland KitchenDysrhythmia   . Essential hypertension   . History of tobacco abuse    a. Quit 2014.  .Marland KitchenPSVT (paroxysmal supraventricular tachycardia) ([place]    a. 10/2012 Noted on Zio Patch.  . Type II diabetes mellitus ([facility]     Patient Active Problem List   Diagnosis Date Noted  . Bilateral flank pain 03/24/2017  . Dyspnea 04/03/2016  . Hypotension 04/03/2016  . Chronic kidney disease 04/03/2016  . Anemia 04/03/2016  . Paroxysmal atrial fibrillation ([place] 12/23/2015  . OSA (obstructive sleep apnea) 12/10/2015  . Left inguinal hernia 11/07/2015  . Anxiety 11/07/2015  . Unstable angina ([place] 10/05/2015  . Back pain with left-sided radiculopathy 09/30/2015  . Nocturnal hypoxia 09/06/2015  . BPH (benign prostatic hyperplasia) 08/01/2015  . Chronic diastolic CHF (congestive heart failure) ([place]   . Angina pectoris ([place]   . Chest pain 07/11/2015  . COPD (chronic obstructive pulmonary disease) ([place] 07/03/2015  . CAD (coronary artery disease) 06/26/2015  . HTN (hypertension) 06/26/2015  . DM (diabetes mellitus) ([place] 06/26/2015  . Achalasia 07/24/2014  . GERD (gastroesophageal reflux disease) 06/07/2014  . Former tobacco use 04/11/2013  . HLD (hyperlipidemia) 04/09/2013    Past Surgical History:  Procedure Laterality Date  . BYPASS GRAFT    . CARDIAC CATHETERIZATION N/A 07/12/2015   rocedure: Left Heart Cath and Cors/Grafts Angiography;  Surgeon: HBelva Crome MD;  Location: [place] CV LAB;  Service: Cardiovascular;  Laterality: N/A;  . CARDIAC CATHETERIZATION Right 10/07/2015   Procedure: Left Heart Cath and Cors/Grafts Angiography;  Surgeon: Dionisio David, MD;  Location: [place] CV LAB;  Service: Cardiovascular;  Laterality: Right;  . CARDIAC CATHETERIZATION N/A 04/06/2016   Procedure: Left Heart Cath and Coronary Angiography;  Surgeon: Yolonda Kida, MD;  Location: [place] CV LAB;  Service: Cardiovascular;  Laterality: N/A;  . CARDIAC CATHETERIZATION   04/06/2016   Procedure: Bypass Graft Angiography;  Surgeon: Yolonda Kida, MD;  Location: [place] CV LAB;  Service: Cardiovascular;;  . CARDIAC CATHETERIZATION N/A 11/02/2016   Procedure: Left Heart Cath and Cors/Grafts Angiography and possible PCI;  Surgeon: Yolonda Kida, MD;  Location: [place] CV LAB;  Service: Cardiovascular;  Laterality: N/A;  . CARDIAC CATHETERIZATION N/A 11/02/2016   Procedure: Coronary Stent Intervention;  Surgeon: Yolonda Kida, MD;  Location: [place] CV LAB;  Service: Cardiovascular;  Laterality: N/A;  . CHOLECYSTECTOMY    . ESOPHAGEAL DILATION    . TONSILLECTOMY    . VASCULAR SURGERY      Prior to Admission medications   Medication Sig Start Date End Date Taking? Authorizing Provider  albuterol (PROVENTIL HFA;VENTOLIN HFA) 108 (90 Base) MCG/ACT inhaler Inhale 4-6 puffs into the lungs every 4 (four) hours as needed for wheezing or shortness of breath. Reported on 04/08/2016    [provider]  allopurinol (ZYLOPRIM) 100 MG tablet TAKE 3 TABLETS BY MOUTH EVERY DAY. 09/11/17   Birdie Sons, MD  ALPRAZolam Duanne Moron) 1 MG tablet TAKE ONE-HALF TO ONE TABLET BY MOUTH THREE TIMES DAILY AS NEEDED FOR ANXIETY 09/28/17   Birdie Sons, MD  apixaban (ELIQUIS) 5 MG TABS tablet Take 5 mg by mouth 2 (two) times daily.    [provider]  aspirin 81 MG chewable tablet Chew 81 mg by mouth daily.    [provider]  atorvastatin (LIPITOR) 80 MG tablet TAKE ONE TABLET BY MOUTH EVERY NIGHT AT BEDTIME 08/13/17   Birdie Sons, MD  BANOPHEN 25 MG capsule TAKE 1 CAPSULE (25MG TOTAL) BY MOUTH EVERY 4 (FOUR) HOURS AS NEEDED. 07/14/17   Birdie Sons, MD  cetirizine (ZYRTEC) 10 MG tablet TAKE ONE TABLET BY MOUTH AT BEDTIME. 05/12/17   Birdie Sons, MD  cyclobenzaprine (FLEXERIL) 5 MG tablet Take 1-2 tablets (5-10 mg total) by mouth 3 (three) times daily as needed (back pain). 05/28/17   Birdie Sons, MD  docusate sodium  (COLACE) 100 MG capsule Take 100 mg by mouth daily.     [provider]  ELIQUIS 5 MG TABS tablet TAKE 1 TABLET BY MOUTH TWICE A DAY. 07/28/17   Birdie Sons, MD  famotidine (PEPCID) 40 MG tablet Take 1 tablet (40 mg total) by mouth every evening. 05/12/17 05/12/18  Birdie Sons, MD  fluticasone furoate-vilanterol (BREO ELLIPTA) 100-25 MCG/INH AEPB Inhale 1 puff into the lungs daily.     [provider]  glucose blood (ACCU-CHEK AVIVA PLUS) test strip Use to check blood sugar once a day 11/26/16   Birdie Sons, MD  hydrocortisone 1 % ointment Apply 1 application topically 2 (two) times daily. Apply to rash 06/19/17   [name], MD  isosorbide mononitrate (IMDUR) 60 MG 24 hr tablet Take 1 tablet (60 mg total) by mouth daily. Patient taking differently: Take 60 mg by mouth 2 (two) times daily.  11/04/16   Abel Presto  J, MD  JARDIANCE 10 MG TABS tablet TAKE 1 TABLET BY MOUTH EVERY DAY 07/14/17   Birdie Sons, MD  lansoprazole (PREVACID) 30 MG capsule Take 30 mg by mouth 2 (two) times daily.     [provider]  lisinopril (PRINIVIL,ZESTRIL) 2.5 MG tablet TAKE 1 TABLET BY MOUTH EVERY DAY 08/13/17   Darylene Price A, FNP  Magnesium Oxide 400 (240 Mg) MG TABS TAKE ONE TABLET BY MOUTH DAILY 03/23/17   Birdie Sons, MD  Magnesium Oxide 400 (240 Mg) MG TABS TAKE 1 TABLET BY MOUTH EVERY DAY 09/11/17   Darylene Price A, FNP  metFORMIN (GLUCOPHAGE) 500 MG tablet TAKE 1 TABLET BY MOUTH EVERY MORNING. 10/05/16   Birdie Sons, MD  nitroGLYCERIN (NITROSTAT) 0.4 MG SL tablet Place 1 tablet (0.4 mg total) under the tongue every 5 (five) minutes as needed for chest pain. Reported on 05/26/2016 10/06/16   Birdie Sons, MD  omega-3 acid ethyl esters (LOVAZA) 1 g capsule TAKE FOUR CAPSULES BY MOUTH DAILY 06/04/17   Birdie Sons, MD  oxyCODONE-acetaminophen (PERCOCET) 10-325 MG tablet Take 1 tablet by mouth every 4 (four) hours as needed. 09/09/17   Birdie Sons,  MD  potassium chloride SA (K-DUR,KLOR-CON) 20 MEQ tablet Take 1 tablet (20 mEq total) by mouth daily. And an additional 78mq in the afternoon when taking the metolazone 03/05/17 06/03/17  HDarylene PriceA, FNP  RANEXA 500 MG 12 hr tablet Take 1,000 mg by mouth 2 (two) times daily.    [provider]  sotalol (BETAPACE) 120 MG tablet Take 120 mg by mouth 2 (two) times daily.     [provider]  SPIRIVA HANDIHALER 18 MCG inhalation capsule INHALE 1 CAPSULE VIA INHALER ONCE A DAY 01/25/17   FBirdie Sons MD  sucralfate (CARAFATE) 1 g tablet Take 1 tablet (1 g total) by mouth 2 (two) times daily. 05/03/17   WLoney Hering MD  tamsulosin (FLOMAX) 0.4 MG CAPS capsule Take 0.4 mg by mouth daily after breakfast.     [provider]  torsemide (DEMADEX) 100 MG tablet Take 50 mg by mouth daily.     [provider]    Allergies  Allergen Reactions  . Demerol [Meperidine] Hives  . Prednisone Other (See Comments) and Hypertension    Pt states that this medication puts him in A-fib   . Sulfa Antibiotics Hives  . Albuterol Sulfate [Albuterol] Palpitations and Other (See Comments)    Pt currently uses this medication.    . Morphine Sulfate Nausea And Vomiting, Rash and Other (See Comments)    Pt states that he is only allergic to the tablet form of this medication.      Family History  Problem Relation Age of Onset  . Heart attack Mother   . Depression Mother   . Heart disease Mother   . COPD Mother   . Hypertension Mother   . Heart attack Father   . Diabetes Father   . Depression Father   . Heart disease Father   . Cirrhosis Father   . Parkinson's disease Brother     Social History Social History   Tobacco Use  . Smoking status: Former Smoker    Last attempt to quit: 04/22/2013    Years since quitting: 4.4  . Smokeless tobacco: Never Used  Substance Use Topics  . Alcohol use: No    Comment: remotely quit alcohol use. Hx of heavy alcohol use.  .  Drug  use: No    Review of Systems Constitutional: Negative for fever. Cardiovascular: Positive for chest pain Respiratory: Shortness of breath Gastrointestinal: Negative for abdominal pain, vomiting positive for nausea Musculoskeletal: Negative for leg pain or swelling Neurological: Negative for headache All other ROS negative  ____________________________________________   PHYSICAL EXAM:  VITAL SIGNS: ED Triage Vitals  Enc Vitals Group     BP 09/30/17 1931 (!) 161/93     Pulse Rate 09/30/17 1931 69     Resp 09/30/17 1931 20     Temp 09/30/17 1931 98.6 F (37 C)     Temp Source 09/30/17 1931 Oral     SpO2 09/30/17 1931 99 %     Weight 09/30/17 1932 234 lb (106.1 kg)     Height 09/30/17 1932 5' 7"  (1.702 m)     Head Circumference --      Peak Flow --      Pain Score 09/30/17 1931 9     Pain Loc --      Pain Edu? --      Excl. in [place]? --     Constitutional: Alert and oriented. Well appearing and in no distress. Eyes: Normal exam ENT   Head: Normocephalic and atraumatic.   Mouth/Throat: Mucous membranes are moist. Cardiovascular: Normal rate, regular rhythm. No murmur Respiratory: Normal respiratory effort without tachypnea nor retractions. Breath sounds are clear Gastrointestinal: Soft and nontender. No distention.  Musculoskeletal: Nontender with normal range of motion in all extremities. No lower extremity tenderness or edema. Neurologic:  Normal speech and language. No gross focal neurologic deficits Skin:  Skin is warm, dry and intact.  Psychiatric: Mood and affect are normal.  ____________________________________________    EKG  EKG reviewed and interpreted by myself shows normal sinus rhythm at 70 bpm, narrow QRS, normal axis, normal intervals, no concerning ST changes.  ____________________________________________    RADIOLOGY  Chest x-ray shows chronic disease without acute  exacerbation  ____________________________________________   INITIAL IMPRESSION / ASSESSMENT AND PLAN / ED COURSE  Pertinent labs & imaging results that were available during my care of the patient were reviewed by me and considered in my medical decision making (see chart for details).  Patient presents to the emergency department for chest pain.  Differential includes MI, ACS, chest wall pain, pneumonia, pneumothorax, CHF/COPD exacerbation.  The patient has a significant cardiac history including multiple MIs, cardiac bypass in 2002 and 4 stents including one stent placed approximately 1 year ago.  Patient describes the chest pain as a heaviness sensation associate with nausea shortness of breath similar to past MIs.  States this is the first time he has had any chest pain since his last MI December of this last year.  I reviewed the patient's records including his discharge summary from December 2017 for an end STEMI.  Patient had a cardiac catheterization performed 11/02/16 and a stent placed, the chart states patient was pain-free after catheterization.  Patient's labs are reassuring, troponin is negative.  EKG shows no acute abnormality.  Chest x-ray is negative.  However given the patient's symptoms and significant cardiac history we will dose nitroglycerin, morphine for pain control and admit to the hospital for further treatment and workup.  ____________________________________________   FINAL CLINICAL IMPRESSION(S) / ED DIAGNOSES  chest pain    Harvest Dark, MD 09/30/17 2233

## 2017-09-30 NOTE — H&P (Signed)
[facility] NAME: Lawrence [name]    MR#:  [id number]  DATE OF BIRTH:  [birth year]  DATE OF ADMISSION:  09/30/2017  PRIMARY CARE PHYSICIAN: Birdie Sons, MD   REQUESTING/REFERRING PHYSICIAN: Kerman Passey, MD  CHIEF COMPLAINT:   Chief Complaint  Patient presents with  . Chest Pain    HISTORY OF PRESENT ILLNESS:  Lawrence [name]  is a 61 y.o. male who presents with substernal chest pain.  Patient has a history of CAD and prior MI with stent placement earlier this year.  He states that his chest pain is very similar to the pain he had when he had his prior heart attack and stents placed.  This pain is nonradiating, but was associated with some shortness of breath and some diaphoresis.  Pain was persistent.  Here in the ED his initial evaluation was with the limits, but with persistent pain hospitalist were called for admission and further evaluation  PAST MEDICAL HISTORY:   Past Medical History:  Diagnosis Date  . A-fib ([place])   . Anemia   . Anxiety   . Asthma   . CAD (coronary artery disease)    a. 2002 CABGx2 (LIMA->LAD, VG->VG->OM1);  b. 09/2012 DES->OM;  c. 03/2015 PTCA of LAD [facility]) in setting of atretic LIMA; d. 05/2015 Cath [facility]): nonobs dzs; e. 06/2015 Cath ([facility]): LM nl, LAD 45p/d ISR, 50d, D1/2 small, LCX 50p/d ISR, OM1 70ost, 30 ISR, VG->OM1 50ost, 75m LIMA->LAD 99p/d - atretic, RCA dom, nl; f.cath 10/16: 40-50%(FFR 0.90) pLAD, 75% (FFR 0.77) mLAD s/p PCI/DES, oRCA 40% (FFR0.95)  . Celiac disease   . Chronic diastolic CHF (congestive heart failure) ([place]    a. 06/2009 Echo: EF 60-65%, Gr 1 DD, triv AI, mildly dil LA, nl RV.  .Marland KitchenCOPD (chronic obstructive pulmonary disease) ([place]    a. Chronic bronchitis and emphysema.  .Marland KitchenDysrhythmia   . Essential hypertension   . History of tobacco abuse    a. Quit 2014.  .Marland KitchenPSVT (paroxysmal supraventricular tachycardia) ([place]    a. 10/2012 Noted on Zio Patch.  . Type II diabetes  mellitus ([place]     PAST SURGICAL HISTORY:   Past Surgical History:  Procedure Laterality Date  . BYPASS GRAFT    . CARDIAC CATHETERIZATION N/A 07/12/2015   rocedure: Left Heart Cath and Cors/Grafts Angiography;  Surgeon: HBelva Crome MD;  [place];  Service: Cardiovascular;  Laterality: N/A;  . CARDIAC CATHETERIZATION Right 10/07/2015   Procedure: Left Heart Cath and Cors/Grafts Angiography;  Surgeon: SDionisio  MD;  [place] [facility];  Service: Cardiovascular;  Laterality: Right;  . CARDIAC CATHETERIZATION N/A 04/06/2016   Procedure: Left Heart Cath and Coronary Angiography;  Surgeon: DYolonda Kida MD;  [place];  Service: Cardiovascular;  Laterality: N/A;  . CARDIAC CATHETERIZATION  04/06/2016   Procedure: Bypass Graft Angiography;  Surgeon: DYolonda Kida MD;  [place];  Service: Cardiovascular;;  . CARDIAC CATHETERIZATION N/A 11/02/2016   Procedure: Left Heart Cath and Cors/Grafts Angiography and possible PCI;  Surgeon: DYolonda Kida MD;  [place] [facility];  Service: Cardiovascular;  Laterality: N/A;  . CARDIAC CATHETERIZATION N/A 11/02/2016   Procedure: Coronary Stent Intervention;  Surgeon: DYolonda Kida MD;  [facility];  Service: Cardiovascular;  Laterality: N/A;  . CHOLECYSTECTOMY    . ESOPHAGEAL DILATION    . TONSILLECTOMY    .  VASCULAR SURGERY      SOCIAL HISTORY:   Social History   Tobacco Use  . Smoking status: Former Smoker    Last attempt to quit: 04/22/2013    Years since quitting: 4.4  . Smokeless tobacco: Never Used  Substance Use Topics  . Alcohol use: No    Comment: remotely quit alcohol use. Hx of heavy alcohol use.    FAMILY HISTORY:   Family History  Problem Relation Age of Onset  . Heart attack Mother   . Depression Mother   . Heart disease Mother   . COPD Mother   . Hypertension Mother   . Heart attack Father   .  Diabetes Father   . Depression Father   . Heart disease Father   . Cirrhosis Father   . Parkinson's disease Brother     DRUG ALLERGIES:   Allergies  Allergen Reactions  . Demerol [Meperidine] Hives  . Prednisone Other (See Comments) and Hypertension    Pt states that this medication puts him in A-fib   . Sulfa Antibiotics Hives  . Albuterol Sulfate [Albuterol] Palpitations and Other (See Comments)    Pt currently uses this medication.    . Morphine Sulfate Nausea And Vomiting, Rash and Other (See Comments)    Pt states that he is only allergic to the tablet form of this medication.      MEDICATIONS AT HOME:   Prior to Admission medications   Medication Sig Start Date End Date Taking? Authorizing Provider  albuterol (PROVENTIL HFA;VENTOLIN HFA) 108 (90 Base) MCG/ACT inhaler Inhale 4-6 puffs into the lungs every 4 (four) hours as needed for wheezing or shortness of breath. Reported on 04/08/2016   Yes [provider]  aspirin 81 MG chewable tablet Chew 81 mg by mouth daily.   Yes [provider]  nitroGLYCERIN (NITROSTAT) 0.4 MG SL tablet Place 1 tablet (0.4 mg total) under the tongue every 5 (five) minutes as needed for chest pain. Reported on 05/26/2016 10/06/16  Yes Birdie Sons, MD  allopurinol (ZYLOPRIM) 100 MG tablet TAKE 3 TABLETS BY MOUTH EVERY DAY. 09/11/17   Birdie Sons, MD  ALPRAZolam Duanne Moron) 1 MG tablet TAKE ONE-HALF TO ONE TABLET BY MOUTH THREE TIMES DAILY AS NEEDED FOR ANXIETY 09/28/17   Birdie Sons, MD  apixaban (ELIQUIS) 5 MG TABS tablet Take 5 mg by mouth 2 (two) times daily.    [provider]  atorvastatin (LIPITOR) 80 MG tablet TAKE ONE TABLET BY MOUTH EVERY NIGHT AT BEDTIME 08/13/17   Birdie Sons, MD  BANOPHEN 25 MG capsule TAKE 1 CAPSULE (25MG TOTAL) BY MOUTH EVERY 4 (FOUR) HOURS AS NEEDED. 07/14/17   Birdie Sons, MD  cetirizine (ZYRTEC) 10 MG tablet TAKE ONE TABLET BY MOUTH AT BEDTIME. 05/12/17   Birdie Sons, MD   cyclobenzaprine (FLEXERIL) 5 MG tablet Take 1-2 tablets (5-10 mg total) by mouth 3 (three) times daily as needed (back pain). 05/28/17   Birdie Sons, MD  docusate sodium (COLACE) 100 MG capsule Take 100 mg by mouth daily.     [provider]  ELIQUIS 5 MG TABS tablet TAKE 1 TABLET BY MOUTH TWICE A DAY. 07/28/17   Birdie Sons, MD  famotidine (PEPCID) 40 MG tablet Take 1 tablet (40 mg total) by mouth every evening. 05/12/17 05/12/18  Birdie Sons, MD  fluticasone furoate-vilanterol (BREO ELLIPTA) 100-25 MCG/INH AEPB Inhale 1 puff into the lungs daily.     [provider]  glucose blood (ACCU-CHEK AVIVA PLUS) test strip Use to check blood sugar once a day 11/26/16   Birdie Sons, MD  hydrocortisone 1 % ointment Apply 1 application topically 2 (two) times daily. Apply to rash 06/19/17   [name], MD  isosorbide mononitrate (IMDUR) 60 MG 24 hr tablet Take 1 tablet (60 mg total) by mouth daily. Patient taking differently: Take 60 mg by mouth 2 (two) times daily.  11/04/16   Henreitta Leber, MD  JARDIANCE 10 MG TABS tablet TAKE 1 TABLET BY MOUTH EVERY DAY 07/14/17   Birdie Sons, MD  lansoprazole (PREVACID) 30 MG capsule Take 30 mg by mouth 2 (two) times daily.     [provider]  lisinopril (PRINIVIL,ZESTRIL) 2.5 MG tablet TAKE 1 TABLET BY MOUTH EVERY DAY 08/13/17   Darylene Price A, FNP  Magnesium Oxide 400 (240 Mg) MG TABS TAKE ONE TABLET BY MOUTH DAILY 03/23/17   Birdie Sons, MD  Magnesium Oxide 400 (240 Mg) MG TABS TAKE 1 TABLET BY MOUTH EVERY DAY 09/11/17   Darylene Price A, FNP  metFORMIN (GLUCOPHAGE) 500 MG tablet TAKE 1 TABLET BY MOUTH EVERY MORNING. 10/05/16   Birdie Sons, MD  omega-3 acid ethyl esters (LOVAZA) 1 g capsule TAKE FOUR CAPSULES BY MOUTH DAILY 06/04/17   Birdie Sons, MD  oxyCODONE-acetaminophen (PERCOCET) 10-325 MG tablet Take 1 tablet by mouth every 4 (four) hours as needed. 09/09/17   Birdie Sons, MD  potassium  chloride SA (K-DUR,KLOR-CON) 20 MEQ tablet Take 1 tablet (20 mEq total) by mouth daily. And an additional 28mq in the afternoon when taking the metolazone 03/05/17 06/03/17  HDarylene PriceA, FNP  RANEXA 500 MG 12 hr tablet Take 1,000 mg by mouth 2 (two) times daily.    [provider]  sotalol (BETAPACE) 120 MG tablet Take 120 mg by mouth 2 (two) times daily.     [provider]  SPIRIVA HANDIHALER 18 MCG inhalation capsule INHALE 1 CAPSULE VIA INHALER ONCE A DAY 01/25/17   FBirdie Sons MD  sucralfate (CARAFATE) 1 g tablet Take 1 tablet (1 g total) by mouth 2 (two) times daily. 05/03/17   WLoney Hering MD  tamsulosin (FLOMAX) 0.4 MG CAPS capsule Take 0.4 mg by mouth daily after breakfast.     [provider]  torsemide (DEMADEX) 100 MG tablet Take 50 mg by mouth daily.     [provider]    REVIEW OF SYSTEMS:  Review of Systems  Constitutional: Positive for diaphoresis. Negative for chills, fever, malaise/fatigue and weight loss.  HENT: Negative for ear pain, hearing loss and tinnitus.   Eyes: Negative for blurred vision, double vision, pain and redness.  Respiratory: Positive for shortness of breath. Negative for cough and hemoptysis.   Cardiovascular: Positive for chest pain. Negative for palpitations, orthopnea and leg swelling.  Gastrointestinal: Negative for abdominal pain, constipation, diarrhea, nausea and vomiting.  Genitourinary: Negative for dysuria, frequency and hematuria.  Musculoskeletal: Negative for back pain, joint pain and neck pain.  Skin:       No acne, rash, or lesions  Neurological: Negative for dizziness, tremors, focal weakness and weakness.  Endo/Heme/Allergies: Negative for polydipsia. Does not bruise/bleed easily.  Psychiatric/Behavioral: Negative for depression. The patient is not nervous/anxious and does not have insomnia.      VITAL SIGNS:   Vitals:   09/30/17 2325 09/30/17 2330 09/30/17 2335 09/30/17 2340  BP:  133/83 128/82 130/84 126/82  Pulse: 61  62 (!) 57 (!) 45  Resp: 18 15 11 19   Temp:      TempSrc:      SpO2: 97% 96% 98% (!) 81%  Weight:      Height:       Wt Readings from Last 3 Encounters:  09/30/17 106.1 kg (234 lb)  08/31/17 107.5 kg (237 lb)  06/04/17 104.3 kg (230 lb)    PHYSICAL EXAMINATION:  Physical Exam  Vitals reviewed. Constitutional: He is oriented to person, place, and time. He appears well-developed and well-nourished. No distress.  HENT:  Head: Normocephalic and atraumatic.  Mouth/Throat: Oropharynx is clear and moist.  Eyes: Conjunctivae and EOM are normal. Pupils are equal, round, and reactive to light. No scleral icterus.  Neck: Normal range of motion. Neck supple. No JVD present. No thyromegaly present.  Cardiovascular: Normal rate, regular rhythm and intact distal pulses. Exam reveals no gallop and no friction rub.  No murmur heard. Respiratory: Effort normal and breath sounds normal. No respiratory distress. He has no wheezes. He has no rales.  GI: Soft. Bowel sounds are normal. He exhibits no distension. There is no tenderness.  Musculoskeletal: Normal range of motion. He exhibits no edema.  No arthritis, no gout  Lymphadenopathy:    He has no cervical adenopathy.  Neurological: He is alert and oriented to person, place, and time. No cranial nerve deficit.  No dysarthria, no aphasia  Skin: Skin is warm and dry. No rash noted. No erythema.  Psychiatric: He has a normal mood and affect. His behavior is normal. Judgment and thought content normal.    LABORATORY PANEL:   CBC Recent Labs  Lab 09/30/17 1929  WBC 6.6  HGB 16.8  HCT 52.8*  PLT 198   ------------------------------------------------------------------------------------------------------------------  Chemistries  Recent Labs  Lab 09/30/17 1929  NA 138  K 4.6  CL 101  CO2 28  GLUCOSE 113*  BUN 12  CREATININE 1.07  CALCIUM 9.6    ------------------------------------------------------------------------------------------------------------------  Cardiac Enzymes Recent Labs  Lab 09/30/17 1929  TROPONINI <0.03   ------------------------------------------------------------------------------------------------------------------  RADIOLOGY:  Dg Chest 2 View  Result Date: 09/30/2017 CLINICAL DATA:  Acute chest pain for 1 day. EXAM: CHEST  2 VIEW COMPARISON:  11/30/2016 and prior radiographs FINDINGS: Cardiomediastinal silhouette is unchanged. COPD/emphysema changes again noted. There is no evidence of focal airspace disease, pulmonary edema, suspicious pulmonary nodule/mass, pleural effusion, or pneumothorax. No acute bony abnormalities are identified. IMPRESSION: COPD/emphysema without evidence of acute cardiopulmonary disease. Electronically Signed   By: Margarette Canada M.D.   On: 09/30/2017 20:01    EKG:   Orders placed or performed during the hospital encounter of 09/30/17  . EKG 12-Lead  . EKG 12-Lead  . ED EKG within 10 minutes  . ED EKG within 10 minutes    IMPRESSION AND PLAN:  Principal Problem:   Chest pain -cycle cardiac enzymes, PRN analgesia, cardiology consult Active Problems:   CAD (coronary artery disease) -continue home meds, other workup as above   HTN (hypertension) -stable, continue home medications   DM (diabetes mellitus) ([place]) -sliding scale insulin with corresponding glucose checks   Chronic diastolic CHF (congestive heart failure) ([place]) -continue home meds   GERD (gastroesophageal reflux disease) -home dose PPI  All the records are reviewed and case discussed with ED provider. Management plans discussed with the patient and/or family.  DVT PROPHYLAXIS: Systemic anticoagulation  GI PROPHYLAXIS: PPI  ADMISSION STATUS: Observation  CODE STATUS: Full Code Status History    Date Active Date Inactive  Code Status Order ID Comments User Context   10/29/2016 22:22 11/03/2016 16:18 Full  Code [id number]  Lance Coon, MD Inpatient   09/03/2016 01:43 09/03/2016 18:46 Full Code [id number]  Lance Coon, MD ED   06/25/2016 00:36 06/26/2016 15:35 Full Code [id number]  Lance Coon, MD ED   04/03/2016 20:55 04/07/2016 17:59 Full Code [id number]  Theodoro Grist, MD Inpatient   03/26/2016 22:24 03/28/2016 18:00 Full Code [id number]  Lance Coon, MD Inpatient   10/07/2015 09:36 10/07/2015 18:30 Full Code [id number]  Dionisio Sabino Denning, MD Inpatient   10/07/2015 08:26 10/07/2015 09:36 Full Code [id number]  Dionisio [place] Spees, MD Inpatient   10/05/2015 19:29 10/07/2015 08:26 Full Code [id number]  Idelle Crouch, MD Inpatient   09/12/2015 21:29 09/14/2015 14:25 Full Code [id number]  Demetrios Loll, MD Inpatient   08/01/2015 05:21 08/02/2015 13:46 Full Code [id number]  Lance Coon, MD Inpatient   07/12/2015 15:24 07/13/2015 20:17 Full Code [id number]  Belva Crome, MD Inpatient   07/11/2015 20:00 07/12/2015 15:24 Full Code [id number]  Reola Mosher Inpatient   06/26/2015 05:43 06/27/2015 15:30 Full Code [id number]  Juluis Mire, MD Inpatient      TOTAL TIME TAKING CARE OF THIS PATIENT: 45 minutes.   Lawrence [name] 09/30/2017, 11:49 PM  [facility]  Office  [phone]  CC: Primary care physician; Birdie Sons, MD  Note:  This document was prepared using Dragon voice recognition software and may include unintentional dictation errors.

## 2017-09-30 NOTE — ED Triage Notes (Addendum)
Pt to triage via wheelchair  Pt reports chest pain since yesterday.  Pt took 3 ntg today and 3 baby asa today.  Pt reports nausea.   Pt on 2 liters oxygen at home.  Hx copd cabg and 4 stents

## 2017-10-01 ENCOUNTER — Other Ambulatory Visit: Payer: Self-pay

## 2017-10-01 DIAGNOSIS — R9431 Abnormal electrocardiogram [ECG] [EKG]: Secondary | ICD-10-CM | POA: Diagnosis not present

## 2017-10-01 DIAGNOSIS — I10 Essential (primary) hypertension: Secondary | ICD-10-CM | POA: Diagnosis not present

## 2017-10-01 DIAGNOSIS — I2510 Atherosclerotic heart disease of native coronary artery without angina pectoris: Secondary | ICD-10-CM | POA: Diagnosis not present

## 2017-10-01 DIAGNOSIS — R079 Chest pain, unspecified: Secondary | ICD-10-CM | POA: Diagnosis not present

## 2017-10-01 DIAGNOSIS — R0789 Other chest pain: Secondary | ICD-10-CM | POA: Diagnosis not present

## 2017-10-01 DIAGNOSIS — E119 Type 2 diabetes mellitus without complications: Secondary | ICD-10-CM | POA: Diagnosis not present

## 2017-10-01 LAB — CBC
HCT: 50.7 % (ref 40.0–52.0)
Hemoglobin: 16.1 g/dL (ref 13.0–18.0)
MCH: 28.2 pg (ref 26.0–34.0)
MCHC: 31.7 g/dL — ABNORMAL LOW (ref 32.0–36.0)
MCV: 89 fL (ref 80.0–100.0)
Platelets: 189 10*3/uL (ref 150–440)
RBC: 5.7 MIL/uL (ref 4.40–5.90)
RDW: 17 % — ABNORMAL HIGH (ref 11.5–14.5)
WBC: 5.8 10*3/uL (ref 3.8–10.6)

## 2017-10-01 LAB — BASIC METABOLIC PANEL
Anion gap: 9 (ref 5–15)
BUN: 12 mg/dL (ref 6–20)
CO2: 29 mmol/L (ref 22–32)
Calcium: 9.1 mg/dL (ref 8.9–10.3)
Chloride: 101 mmol/L (ref 101–111)
Creatinine, Ser: 1.1 mg/dL (ref 0.61–1.24)
GFR calc Af Amer: >60 mL/min (ref >60)
GFR calc non Af Amer: >60 mL/min (ref >60)
Glucose, Bld: 119 mg/dL — ABNORMAL HIGH (ref 65–99)
Potassium: 4.2 mmol/L (ref 3.5–5.1)
Sodium: 139 mmol/L (ref 135–145)

## 2017-10-01 LAB — TROPONIN I
Troponin I: <0.03 ng/mL (ref <0.03)
Troponin I: <0.03 ng/mL (ref <0.03)

## 2017-10-01 LAB — GLUCOSE, CAPILLARY
Glucose-Capillary: 112 mg/dL — ABNORMAL HIGH (ref 65–99)
Glucose-Capillary: 112 mg/dL — ABNORMAL HIGH (ref 65–99)
Glucose-Capillary: 119 mg/dL — ABNORMAL HIGH (ref 65–99)

## 2017-10-01 MED ORDER — PANTOPRAZOLE SODIUM 40 MG PO TBEC
40.0000 mg | DELAYED_RELEASE_TABLET | Freq: Two times a day (BID) | ORAL | Status: DC
  Administered 2017-10-01: 40 mg via ORAL
  Filled 2017-10-01: qty 1

## 2017-10-01 MED ORDER — TAMSULOSIN HCL 0.4 MG PO CAPS
0.4000 mg | ORAL_CAPSULE | Freq: Every day | ORAL | Status: DC
  Administered 2017-10-01: 0.4 mg via ORAL
  Filled 2017-10-01: qty 1

## 2017-10-01 MED ORDER — OXYCODONE-ACETAMINOPHEN 5-325 MG PO TABS: 1.0000 | ORAL_TABLET | ORAL | Status: DC | PRN

## 2017-10-01 MED ORDER — OXYCODONE HCL 5 MG PO TABS
5.0000 mg | ORAL_TABLET | ORAL | Status: DC | PRN
  Administered 2017-10-01: 5 mg via ORAL
  Filled 2017-10-01: qty 1

## 2017-10-01 MED ORDER — INSULIN ASPART 100 UNIT/ML ~~LOC~~ SOLN: 0.0000 [IU] | Freq: Three times a day (TID) | SUBCUTANEOUS | Status: DC

## 2017-10-01 MED ORDER — ACETAMINOPHEN 325 MG PO TABS: 650.0000 mg | ORAL_TABLET | Freq: Four times a day (QID) | ORAL | Status: DC | PRN

## 2017-10-01 MED ORDER — TORSEMIDE 20 MG PO TABS
50.0000 mg | ORAL_TABLET | Freq: Every day | ORAL | Status: DC
  Administered 2017-10-01: 50 mg via ORAL
  Filled 2017-10-01: qty 3

## 2017-10-01 MED ORDER — MORPHINE SULFATE (PF) 2 MG/ML IV SOLN: 2.0000 mg | Freq: Once | INTRAVENOUS | Status: DC

## 2017-10-01 MED ORDER — ATORVASTATIN CALCIUM 20 MG PO TABS: 80.0000 mg | ORAL_TABLET | Freq: Every day | ORAL | Status: DC

## 2017-10-01 MED ORDER — FLUTICASONE FUROATE-VILANTEROL 100-25 MCG/INH IN AEPB
1.0000 | INHALATION_SPRAY | Freq: Every day | RESPIRATORY_TRACT | Status: DC
  Administered 2017-10-01: 1 via RESPIRATORY_TRACT
  Filled 2017-10-01: qty 28

## 2017-10-01 MED ORDER — MORPHINE SULFATE (PF) 4 MG/ML IV SOLN
4.0000 mg | INTRAVENOUS | Status: DC | PRN
  Administered 2017-10-01: 4 mg via INTRAVENOUS
  Filled 2017-10-01: qty 1

## 2017-10-01 MED ORDER — ONDANSETRON HCL 4 MG/2ML IJ SOLN: 4.0000 mg | Freq: Four times a day (QID) | INTRAMUSCULAR | Status: DC | PRN

## 2017-10-01 MED ORDER — INSULIN ASPART 100 UNIT/ML ~~LOC~~ SOLN: 0.0000 [IU] | Freq: Every day | SUBCUTANEOUS | Status: DC

## 2017-10-01 MED ORDER — ORAL CARE MOUTH RINSE: 15.0000 mL | Freq: Two times a day (BID) | OROMUCOSAL | Status: DC

## 2017-10-01 MED ORDER — ALPRAZOLAM 0.5 MG PO TABS: 0.5000 mg | ORAL_TABLET | Freq: Three times a day (TID) | ORAL | Status: DC | PRN

## 2017-10-01 MED ORDER — APIXABAN 5 MG PO TABS
5.0000 mg | ORAL_TABLET | Freq: Two times a day (BID) | ORAL | Status: DC
  Administered 2017-10-01: 5 mg via ORAL
  Filled 2017-10-01: qty 1

## 2017-10-01 MED ORDER — ONDANSETRON HCL 4 MG PO TABS: 4.0000 mg | ORAL_TABLET | Freq: Four times a day (QID) | ORAL | Status: DC | PRN

## 2017-10-01 MED ORDER — ACETAMINOPHEN 650 MG RE SUPP
650.0000 mg | Freq: Four times a day (QID) | RECTAL | Status: DC | PRN
Start: 2017-10-01 — End: 2017-10-01

## 2017-10-01 MED ORDER — MORPHINE SULFATE (PF) 2 MG/ML IV SOLN
2.0000 mg | Freq: Once | INTRAVENOUS | Status: AC
Start: 2017-10-01 — End: 2017-10-01
  Administered 2017-10-01: 2 mg via INTRAVENOUS
  Filled 2017-10-01: qty 1

## 2017-10-01 MED ORDER — LISINOPRIL 5 MG PO TABS
2.5000 mg | ORAL_TABLET | Freq: Every day | ORAL | Status: DC
  Administered 2017-10-01: 2.5 mg via ORAL
  Filled 2017-10-01: qty 1

## 2017-10-01 MED ORDER — OXYCODONE-ACETAMINOPHEN 10-325 MG PO TABS: 1.0000 | ORAL_TABLET | ORAL | Status: DC | PRN

## 2017-10-01 MED ORDER — ASPIRIN 81 MG PO CHEW
81.0000 mg | CHEWABLE_TABLET | Freq: Every day | ORAL | Status: DC
  Administered 2017-10-01: 81 mg via ORAL
  Filled 2017-10-01: qty 1

## 2017-10-01 MED ORDER — PANTOPRAZOLE SODIUM 20 MG PO TBEC: 20.0000 mg | DELAYED_RELEASE_TABLET | Freq: Two times a day (BID) | ORAL | Status: DC

## 2017-10-01 MED ORDER — FAMOTIDINE 20 MG PO TABS: 40.0000 mg | ORAL_TABLET | Freq: Every evening | ORAL | Status: DC

## 2017-10-01 MED ORDER — RANOLAZINE ER 500 MG PO TB12
1000.0000 mg | ORAL_TABLET | Freq: Two times a day (BID) | ORAL | Status: DC
  Administered 2017-10-01: 1000 mg via ORAL
  Filled 2017-10-01 (×3): qty 2

## 2017-10-01 MED ORDER — SODIUM CHLORIDE 0.9% FLUSH
3.0000 mL | Freq: Two times a day (BID) | INTRAVENOUS | Status: DC
  Administered 2017-10-01: 3 mL via INTRAVENOUS

## 2017-10-01 MED ORDER — ISOSORBIDE MONONITRATE ER 60 MG PO TB24
60.0000 mg | ORAL_TABLET | Freq: Every day | ORAL | Status: DC
Start: 2017-10-01 — End: 2017-10-01
  Administered 2017-10-01: 60 mg via ORAL
  Filled 2017-10-01: qty 1

## 2017-10-01 MED ORDER — TIOTROPIUM BROMIDE MONOHYDRATE 18 MCG IN CAPS
18.0000 ug | ORAL_CAPSULE | Freq: Every day | RESPIRATORY_TRACT | Status: DC
  Administered 2017-10-01: 18 ug via RESPIRATORY_TRACT
  Filled 2017-10-01: qty 5

## 2017-10-01 MED ORDER — SOTALOL HCL 80 MG PO TABS
120.0000 mg | ORAL_TABLET | Freq: Two times a day (BID) | ORAL | Status: DC
  Administered 2017-10-01: 120 mg via ORAL
  Filled 2017-10-01 (×3): qty 1

## 2017-10-01 NOTE — Care Management Obs Status (Signed)
[facility] NOTIFICATION   Patient Details  Name: Lawrence [name] MRN: [id number] Date of Birth: 1956-03-11   Medicare Observation Status Notification Given:  No Discharged < 24 hours of being placed in observation   Katrina Stack, RN 10/01/2017, 11:43 AM

## 2017-10-01 NOTE — Progress Notes (Signed)
[facility] provided HCPOA education to patient. Patient will contact [facility] when/if decides to complete forms.    10/01/17 0900  Clinical Encounter Type  Visited With Patient  Visit Type Initial;Spiritual support  Referral From Nurse

## 2017-10-01 NOTE — Progress Notes (Signed)
Discharge instructions given, IV taken out, tele off. Pt on home oxygen. Pt verbalized understanding with no questions. Pt transported via wheelchair.

## 2017-10-01 NOTE — Progress Notes (Signed)
Pt still complaining of chest pain. MD aware, spoke to patient. No new orders at this time.

## 2017-10-01 NOTE — Discharge Instructions (Signed)
Heart healthy and ADA diet. °

## 2017-10-01 NOTE — Consult Note (Signed)
Reason for Consult: Chest pain known coronary disease Referring Physician: Dr. [name] primary Dr. Lance Coon hospitalist Cardiologist Lawrence [name] is an 61 y.o. male.  HPI: Patient presents with substernal chest pain known coronary disease previous coronary bypass surgery PCI and stent back in December to a vein graft.  Patient has persistent chronic recurrent chest pain symptoms is also on narcotic therapy for chronic discomfort patient is on Imdur as well as Ranexa.  Patient states chest pain started yesterday was persistent so came to the emergency room for further assessment evaluation.  Patient has history of atrial fibrillation on anticoagulation with Eliquis.  No regurgitation  Past Medical History:  Diagnosis Date  . A-fib ([place])   . Anemia   . Anxiety   . Asthma   . CAD (coronary artery disease)    a. 2002 CABGx2 (LIMA->LAD, VG->VG->OM1);  b. 09/2012 DES->OM;  c. 03/2015 PTCA of LAD [facility]) in setting of atretic LIMA; d. 05/2015 Cath [facility]): nonobs dzs; e. 06/2015 Cath ([facility]): LM nl, LAD 45p/d ISR, 50d, D1/2 small, LCX 50p/d ISR, OM1 70ost, 30 ISR, VG->OM1 50ost, 86m LIMA->LAD 99p/d - atretic, RCA dom, nl; f.cath 10/16: 40-50%(FFR 0.90) pLAD, 75% (FFR 0.77) mLAD s/p PCI/DES, oRCA 40% (FFR0.95)  . Celiac disease   . Chronic diastolic CHF (congestive heart failure) ([place]    a. 06/2009 Echo: EF 60-65%, Gr 1 DD, triv AI, mildly dil LA, nl RV.  .Marland KitchenCOPD (chronic obstructive pulmonary disease) ([place]    a. Chronic bronchitis and emphysema.  .Marland KitchenDysrhythmia   . Essential hypertension   . History of tobacco abuse    a. Quit 2014.  .Marland KitchenPSVT (paroxysmal supraventricular tachycardia) ([place]    a. 10/2012 Noted on Zio Patch.  . Type II diabetes mellitus ([place]     Past Surgical History:  Procedure Laterality Date  . BYPASS GRAFT    . Bypass Graft Angiography  04/06/2016   Performed by [facility]onda [facility]ida [facility]  . CHOLECYSTECTOMY    . Coronary Stent  Intervention N/A 11/02/2016   [facility]formed [facility]  . ESOPHAGEAL DILATION    . Left Heart Cath and Coronary Angiography N/A 04/06/2016   Performed by [facility] at [facility]  . Left Heart Cath and Cors/Grafts Angiography Right 10/07/2015   Performed by KDionisio David MD at [facility]  . Left Heart Cath and Cors/Grafts Angiography N/A 07/12/2015   Performed by SBelva Crome MD at [facility]  . Left Heart Cath and Cors/Grafts Angiography and possible PCI N/A 11/02/2016   Performed by [facility]  . TONSILLECTOMY    . VASCULAR SURGERY      Family History  Problem Relation Age of Onset  . Heart attack Mother   . Depression Mother   . Heart disease Mother   . COPD Mother   . Hypertension Mother   . Heart attack Father   . Diabetes Father   . Depression Father   . Heart disease Father   . Cirrhosis Father   . Parkinson's disease Brother     Social History:  reports that he quit smoking about 4 years ago. he has never used smokeless tobacco. He reports that he does not drink alcohol or use drugs.  Allergies:  Allergies  Allergen Reactions  . Demerol [Meperidine] Hives  .  Prednisone Other (See Comments) and Hypertension    Pt states that this medication puts him in A-fib   . Sulfa Antibiotics Hives  . Albuterol Sulfate [Albuterol] Palpitations and Other (See Comments)    Pt currently uses this medication.    . Morphine Sulfate Nausea And Vomiting, Rash and Other (See Comments)    Pt states that he is only allergic to the tablet form of this medication.      Medications: I have reviewed the patient's current medications.  Results for orders placed or performed during the hospital encounter of 09/30/17 (from the past 48 hour(s))  Basic metabolic panel     Status: Abnormal   Collection Time: 09/30/17  7:29 PM  Result Value Ref Range   Sodium 138 135 - 145 mmol/L    Potassium 4.6 3.5 - 5.1 mmol/L   Chloride 101 101 - 111 mmol/L   CO2 28 22 - 32 mmol/L   Glucose, Bld 113 (H) 65 - 99 mg/dL   BUN 12 6 - 20 mg/dL   Creatinine, Ser 1.07 0.61 - 1.24 mg/dL   Calcium 9.6 8.9 - 10.3 mg/dL   GFR calc non Af Amer >60 >60 mL/min   GFR calc Af Amer >60 >60 mL/min    Comment: (NOTE) The eGFR has been calculated using the CKD EPI equation. This calculation has not been validated in all clinical situations. eGFR's persistently <60 mL/min signify possible Chronic Kidney Disease.    Anion gap 9 5 - 15  CBC     Status: Abnormal   Collection Time: 09/30/17  7:29 PM  Result Value Ref Range   WBC 6.6 3.8 - 10.6 K/uL   RBC 5.93 (H) 4.40 - 5.90 MIL/uL   Hemoglobin 16.8 13.0 - 18.0 g/dL   HCT 52.8 (H) 40.0 - 52.0 %   MCV 89.0 80.0 - 100.0 fL   MCH 28.3 26.0 - 34.0 pg   MCHC 31.7 (L) 32.0 - 36.0 g/dL   RDW 17.4 (H) 11.5 - 14.5 %   Platelets 198 150 - 440 K/uL  Troponin I     Status: None   Collection Time: 09/30/17  7:29 PM  Result Value Ref Range   Troponin I <0.03 <0.03 ng/mL  Glucose, capillary     Status: Abnormal   Collection Time: 10/01/17 12:47 AM  Result Value Ref Range   Glucose-Capillary 112 (H) 65 - 99 mg/dL  Basic metabolic panel     Status: Abnormal   Collection Time: 10/01/17  1:32 AM  Result Value Ref Range   Sodium 139 135 - 145 mmol/L   Potassium 4.2 3.5 - 5.1 mmol/L   Chloride 101 101 - 111 mmol/L   CO2 29 22 - 32 mmol/L   Glucose, Bld 119 (H) 65 - 99 mg/dL   BUN 12 6 - 20 mg/dL   Creatinine, Ser 1.10 0.61 - 1.24 mg/dL   Calcium 9.1 8.9 - 10.3 mg/dL   GFR calc non Af Amer >60 >60 mL/min   GFR calc Af Amer >60 >60 mL/min    Comment: (NOTE) The eGFR has been calculated using the CKD EPI equation. This calculation has not been validated in all clinical situations. eGFR's persistently <60 mL/min signify possible Chronic Kidney Disease.    Anion gap 9 5 - 15  CBC     Status: Abnormal   Collection Time: 10/01/17  1:32 AM  Result  Value Ref Range   WBC 5.8 3.8 - 10.6 K/uL   RBC 5.70 4.40 -  5.90 MIL/uL   Hemoglobin 16.1 13.0 - 18.0 g/dL   HCT 50.7 40.0 - 52.0 %   MCV 89.0 80.0 - 100.0 fL   MCH 28.2 26.0 - 34.0 pg   MCHC 31.7 (L) 32.0 - 36.0 g/dL   RDW 17.0 (H) 11.5 - 14.5 %   Platelets 189 150 - 440 K/uL  Troponin I     Status: None   Collection Time: 10/01/17  1:32 AM  Result Value Ref Range   Troponin I <0.03 <0.03 ng/mL  Troponin I     Status: None   Collection Time: 10/01/17  7:23 AM  Result Value Ref Range   Troponin I <0.03 <0.03 ng/mL  Glucose, capillary     Status: Abnormal   Collection Time: 10/01/17  7:59 AM  Result Value Ref Range   Glucose-Capillary 112 (H) 65 - 99 mg/dL  Glucose, capillary     Status: Abnormal   Collection Time: 10/01/17 11:59 AM  Result Value Ref Range   Glucose-Capillary 119 (H) 65 - 99 mg/dL    Dg Chest 2 View  Result Date: 09/30/2017 CLINICAL DATA:  Acute chest pain for 1 day. EXAM: CHEST  2 VIEW COMPARISON:  11/30/2016 and prior radiographs FINDINGS: Cardiomediastinal silhouette is unchanged. COPD/emphysema changes again noted. There is no evidence of focal airspace disease, pulmonary edema, suspicious pulmonary nodule/mass, pleural effusion, or pneumothorax. No acute bony abnormalities are identified. IMPRESSION: COPD/emphysema without evidence of acute cardiopulmonary disease. Electronically Signed   By: Margarette Canada M.D.   On: 09/30/2017 20:01    Review of Systems  Constitutional: Positive for malaise/fatigue.  HENT: Positive for congestion.   Eyes: Negative.   Respiratory: Positive for shortness of breath.   Cardiovascular: Positive for chest pain, palpitations and orthopnea.  Gastrointestinal: Positive for heartburn.  Genitourinary: Negative.   Musculoskeletal: Positive for myalgias.  Skin: Negative.   Neurological: Positive for weakness.  Endo/Heme/Allergies: Negative.   Psychiatric/Behavioral: Negative.    Blood pressure 118/82, pulse 69, temperature 97.8  F (36.6 C), temperature source Oral, resp. rate 18, height 5' 7" (1.702 m), weight 228 lb 1.6 oz (103.5 kg), SpO2 97 %. Physical Exam  Nursing note and vitals reviewed. Constitutional: He is oriented to person, place, and time. He appears well-developed and well-nourished.  HENT:  Head: Normocephalic and atraumatic.  Eyes: Conjunctivae and EOM are normal. Pupils are equal, round, and reactive to light.  Neck: Normal range of motion. Neck supple.  Cardiovascular: Normal rate and regular rhythm.  Murmur heard. Respiratory: Effort normal and breath sounds normal.  GI: Soft. Bowel sounds are normal.  Musculoskeletal: Normal range of motion.  Neurological: He is alert and oriented to person, place, and time. He has normal reflexes.  Skin: Skin is warm and dry.  Psychiatric: He has a normal mood and affect.    Assessment/Plan: Chronic Chest Pain Known CAD HTN AFIB Obesity CABG PCI stent Gout GERD . Plan 1 Continue medical therapy increase imdur and/or Ranexa 2 continue blood pressure control with current medications Zestril 3 continue diabetes management Jardiance Atrial fibrillation continue Eliquis anticoagulation her best general for her back sotalol for antiarrhythmic 4 COPD continue inhalers as necessary 5 GERD continue Prevacid as necessary Carafate 6 chronic pain continue Percocet as necessary as the patient follow-up with Dr. Helane Gunther close as an outpatient next week      Lawrence [name] 10/01/2017, 12:13 PM

## 2017-10-01 NOTE — Discharge Summary (Signed)
[place] [facility] [place] NAME: Lawrence [name]    MR#:  [id number]  DATE OF BIRTH:  Jul 16, 1956  DATE OF ADMISSION:  09/30/2017   ADMITTING PHYSICIAN: Lance Coon, MD  DATE OF DISCHARGE: 10/01/2017  2:57 PM  PRIMARY CARE PHYSICIAN: Birdie Sons, MD   ADMISSION DIAGNOSIS:  Chest pain, unspecified type [R07.9] DISCHARGE DIAGNOSIS:  Principal Problem:   Chest pain Active Problems:   CAD (coronary artery disease)   HTN (hypertension)   DM (diabetes mellitus) (HCC)   GERD (gastroesophageal reflux disease)   Chronic diastolic CHF (congestive heart failure) ([place])  SECONDARY DIAGNOSIS:   Past Medical History:  Diagnosis Date  . A-fib ([place])   . Anemia   . Anxiety   . Asthma   . CAD (coronary artery disease)    a. 2002 CABGx2 (LIMA->LAD, VG->VG->OM1);  b. 09/2012 DES->OM;  c. 03/2015 PTCA of LAD [facility]) in setting of atretic LIMA; d. 05/2015 Cath [facility]): nonobs dzs; e. 06/2015 Cath ([facility]): LM nl, LAD 45p/d ISR, 50d, D1/2 small, LCX 50p/d ISR, OM1 70ost, 30 ISR, VG->OM1 50ost, 54m LIMA->LAD 99p/d - atretic, RCA dom, nl; f.cath 10/16: 40-50%(FFR 0.90) pLAD, 75% (FFR 0.77) mLAD s/p PCI/DES, oRCA 40% (FFR0.95)  . Celiac disease   . Chronic diastolic CHF (congestive heart failure) ([place]    a. 06/2009 Echo: EF 60-65%, Gr 1 DD, triv AI, mildly dil LA, nl RV.  .Marland KitchenCOPD (chronic obstructive pulmonary disease) ([place]    a. Chronic bronchitis and emphysema.  .Marland KitchenDysrhythmia   . Essential hypertension   . History of tobacco abuse    a. Quit 2014.  .Marland KitchenPSVT (paroxysmal supraventricular tachycardia) ([place]    a. 10/2012 Noted on Zio Patch.  . Type II diabetes mellitus ([facility]    HOSPITAL COURSE:  Chest pain with history of CAD. The patient was admitted for chest pain, troponin level negative.  Chest pain is atypical and no further workup and follow-up with Dr. KNehemiah Massedas outpatient per Dr. CClayborn Bigness  Hypertension.  The patient is on home hypertension  medication. Diabetes.  Controlled with sliding scale. Chronic diastolic CHF.  Stable.  DISCHARGE CONDITIONS:  Stable, discharged home today. CONSULTS OBTAINED:  Treatment Team:  CYolonda Kida MD DRUG ALLERGIES:   Allergies  Allergen Reactions  . Demerol [Meperidine] Hives  . Prednisone Other (See Comments) and Hypertension    Pt states that this medication puts him in A-fib   . Sulfa Antibiotics Hives  . Albuterol Sulfate [Albuterol] Palpitations and Other (See Comments)    Pt currently uses this medication.    . Morphine Sulfate Nausea And Vomiting, Rash and Other (See Comments)    Pt states that he is only allergic to the tablet form of this medication.     DISCHARGE MEDICATIONS:   Allergies as of 10/01/2017      Reactions   Demerol [meperidine] Hives   Prednisone Other (See Comments), Hypertension   Pt states that this medication puts him in A-fib    Sulfa Antibiotics Hives   Albuterol Sulfate [albuterol] Palpitations, Other (See Comments)   Pt currently uses this medication.     Morphine Sulfate Nausea And Vomiting, Rash, Other (See Comments)   Pt states that he is only allergic to the tablet form of this medication.        Medication List    STOP taking these medications   potassium chloride SA 20 MEQ tablet Commonly known as:  K-DUR,KLOR-CON  TAKE these medications   albuterol 108 (90 Base) MCG/ACT inhaler Commonly known as:  PROVENTIL HFA;VENTOLIN HFA Inhale 4-6 puffs into the lungs every 4 (four) hours as needed for wheezing or shortness of breath. Reported on 04/08/2016   allopurinol 100 MG tablet Commonly known as:  ZYLOPRIM TAKE 3 TABLETS BY MOUTH EVERY DAY.   ALPRAZolam 1 MG tablet Commonly known as:  XANAX TAKE ONE-HALF TO ONE TABLET BY MOUTH THREE TIMES DAILY AS NEEDED FOR ANXIETY   aspirin 81 MG chewable tablet Chew 81 mg by mouth daily.   atorvastatin 80 MG tablet Commonly known as:  LIPITOR TAKE ONE TABLET BY MOUTH EVERY NIGHT AT  BEDTIME   BANOPHEN 25 mg capsule Generic drug:  diphenhydrAMINE TAKE 1 CAPSULE (25MG TOTAL) BY MOUTH EVERY 4 (FOUR) HOURS AS NEEDED.   BREO ELLIPTA 100-25 MCG/INH Aepb Generic drug:  fluticasone furoate-vilanterol Inhale 1 puff into the lungs daily.   cetirizine 10 MG tablet Commonly known as:  ZYRTEC TAKE ONE TABLET BY MOUTH AT BEDTIME. What changed:    how much to take  how to take this  when to take this   cyclobenzaprine 5 MG tablet Commonly known as:  FLEXERIL Take 1-2 tablets (5-10 mg total) by mouth 3 (three) times daily as needed (back pain).   docusate sodium 100 MG capsule Commonly known as:  COLACE Take 100 mg by mouth daily.   ELIQUIS 5 MG Tabs tablet Generic drug:  apixaban TAKE 1 TABLET BY MOUTH TWICE A DAY.   famotidine 40 MG tablet Commonly known as:  PEPCID Take 1 tablet (40 mg total) by mouth every evening.   glucose blood test strip Commonly known as:  ACCU-CHEK AVIVA PLUS Use to check blood sugar once a day   hydrocortisone 1 % ointment Apply 1 application topically 2 (two) times daily. Apply to rash   isosorbide mononitrate 60 MG 24 hr tablet Commonly known as:  IMDUR Take 1 tablet (60 mg total) by mouth daily. What changed:  when to take this   JARDIANCE 10 MG Tabs tablet Generic drug:  empagliflozin TAKE 1 TABLET BY MOUTH EVERY DAY   lansoprazole 30 MG capsule Commonly known as:  PREVACID Take 30 mg by mouth 2 (two) times daily.   lisinopril 2.5 MG tablet Commonly known as:  PRINIVIL,ZESTRIL TAKE 1 TABLET BY MOUTH EVERY DAY   Magnesium Oxide 400 (240 Mg) MG Tabs TAKE ONE TABLET BY MOUTH DAILY   Magnesium Oxide 400 (240 Mg) MG Tabs TAKE 1 TABLET BY MOUTH EVERY DAY   metFORMIN 500 MG tablet Commonly known as:  GLUCOPHAGE TAKE 1 TABLET BY MOUTH EVERY MORNING.   nitroGLYCERIN 0.4 MG SL tablet Commonly known as:  NITROSTAT Place 1 tablet (0.4 mg total) under the tongue every 5 (five) minutes as needed for chest pain. Reported  on 05/26/2016   omega-3 acid ethyl esters 1 g capsule Commonly known as:  LOVAZA TAKE FOUR CAPSULES BY MOUTH DAILY   oxyCODONE-acetaminophen 10-325 MG tablet Commonly known as:  PERCOCET Take 1 tablet by mouth every 4 (four) hours as needed.   RANEXA 500 MG 12 hr tablet Generic drug:  ranolazine Take 1,000 mg by mouth 2 (two) times daily.   sotalol 120 MG tablet Commonly known as:  BETAPACE Take 120 mg by mouth 2 (two) times daily.   SPIRIVA HANDIHALER 18 MCG inhalation capsule Generic drug:  tiotropium INHALE 1 CAPSULE VIA INHALER ONCE A DAY   sucralfate 1 g tablet Commonly known as:  CARAFATE Take 1 tablet (1 g total) by mouth 2 (two) times daily.        DISCHARGE INSTRUCTIONS:  See AVS  If you experience worsening of your admission symptoms, develop shortness of breath, life threatening emergency, suicidal or homicidal thoughts you must seek medical attention immediately by calling 911 or calling your MD immediately  if symptoms less severe.  You Must read complete instructions/literature along with all the possible adverse reactions/side effects for all the Medicines you take and that have been prescribed to you. Take any new Medicines after you have completely understood and accpet all the possible adverse reactions/side effects.   Please note  You were cared for by a hospitalist during your hospital stay. If you have any questions about your discharge medications or the care you received while you were in the hospital after you are discharged, you can call the unit and asked to speak with the hospitalist on call if the hospitalist that took care of you is not available. Once you are discharged, your primary care physician will handle any further medical issues. Please note that NO REFILLS for any discharge medications will be authorized once you are discharged, as it is imperative that you return to your primary care physician (or establish a relationship with a primary  care physician if you do not have one) for your aftercare needs so that they can reassess your need for medications and monitor your lab values.    On the day of Discharge:  VITAL SIGNS:  Blood pressure 118/82, pulse 69, temperature 97.8 F (36.6 C), temperature source Oral, resp. rate 18, height 5' 7"  (1.702 m), weight 228 lb 1.6 oz (103.5 kg), SpO2 97 %. PHYSICAL EXAMINATION:  GENERAL:  61 y.o.-year-old patient lying in the bed with no acute distress.  EYES: Pupils equal, round, reactive to light and accommodation. No scleral icterus. Extraocular muscles intact.  HEENT: Head atraumatic, normocephalic. Oropharynx and nasopharynx clear.  NECK:  Supple, no jugular venous distention. No thyroid enlargement, no tenderness.  LUNGS: Normal breath sounds bilaterally, no wheezing, rales,rhonchi or crepitation. No use of accessory muscles of respiration.  CARDIOVASCULAR: S1, S2 normal. No murmurs, rubs, or gallops.  ABDOMEN: Soft, non-tender, non-distended. Bowel sounds present. No organomegaly or mass.  EXTREMITIES: No pedal edema, cyanosis, or clubbing.  NEUROLOGIC: Cranial nerves II through XII are intact. Muscle strength 5/5 in all extremities. Sensation intact. Gait not checked.  PSYCHIATRIC: The patient is alert and oriented x 3.  SKIN: No obvious rash, lesion, or ulcer.  DATA REVIEW:   CBC Recent Labs  Lab 10/01/17 0132  WBC 5.8  HGB 16.1  HCT 50.7  PLT 189    Chemistries  Recent Labs  Lab 10/01/17 0132  NA 139  K 4.2  CL 101  CO2 29  GLUCOSE 119*  BUN 12  CREATININE 1.10  CALCIUM 9.1     Microbiology Results  Results for orders placed or performed during the hospital encounter of 07/11/15  MRSA PCR Screening     Status: None   Collection Time: 07/11/15  9:09 PM  Result Value Ref Range Status   MRSA by PCR NEGATIVE NEGATIVE Final    Comment:        The GeneXpert MRSA Assay (FDA approved for NASAL specimens only), is one component of a comprehensive MRSA  colonization surveillance program. It is not intended to diagnose MRSA infection nor to guide or monitor treatment for MRSA infections.     RADIOLOGY:  Dg Chest 2  View  Result Date: 09/30/2017 CLINICAL DATA:  Acute chest pain for 1 day. EXAM: CHEST  2 VIEW COMPARISON:  11/30/2016 and prior radiographs FINDINGS: Cardiomediastinal silhouette is unchanged. COPD/emphysema changes again noted. There is no evidence of focal airspace disease, pulmonary edema, suspicious pulmonary nodule/mass, pleural effusion, or pneumothorax. No acute bony abnormalities are identified. IMPRESSION: COPD/emphysema without evidence of acute cardiopulmonary disease. Electronically Signed   By: Margarette Canada M.D.   On: 09/30/2017 20:01     Management plans discussed with the patient, family and they are in agreement.  CODE STATUS: Full Code   TOTAL TIME TAKING CARE OF THIS PATIENT: 26 minutes.    Lawrence [name] M.D on 10/01/2017 at 5:46 PM  Between 7am to 6pm - Pager - [phone]  After 6pm go to [email or web address] - Proofreader  [facility] [place] [facility]  Office  [phone]  CC: Primary care physician; Birdie Sons, MD   Note: This dictation was prepared with Dragon dictation along with smaller phrase technology. Any transcriptional errors that result from this process are unintentional.

## 2017-10-01 NOTE — Plan of Care (Signed)
  Adequate for Discharge Education: Knowledge of General Education information will improve 10/01/2017 1417 - Adequate for Discharge by Feliberto Gottron, RN Health Behavior/Discharge Planning: Ability to manage health-related needs will improve 10/01/2017 1417 - Adequate for Discharge by Feliberto Gottron, RN Clinical Measurements: Ability to maintain clinical measurements within normal limits will improve 10/01/2017 1417 - Adequate for Discharge by Feliberto Gottron, RN Pain Managment: General experience of comfort will improve 10/01/2017 1417 - Adequate for Discharge by Feliberto Gottron, RN Safety: Ability to remain free from injury will improve 10/01/2017 1417 - Adequate for Discharge by Feliberto Gottron, RN Education: Understanding of cardiac disease, CV risk reduction, and recovery process will improve 10/01/2017 1417 - Adequate for Discharge by Feliberto Gottron, RN Activity: Ability to tolerate increased activity will improve 10/01/2017 1417 - Adequate for Discharge by Feliberto Gottron, RN Cardiac: Ability to achieve and maintain adequate cardiovascular perfusion will improve 10/01/2017 1417 - Adequate for Discharge by Feliberto Gottron, RN Health Behavior/Discharge Planning: Ability to safely manage health-related needs after discharge will improve 10/01/2017 1417 - Adequate for Discharge by Feliberto Gottron, RN

## 2017-10-01 NOTE — Plan of Care (Signed)
  Progressing Education: Knowledge of General Education information will improve 10/01/2017 0253 - Progressing by Loran Senters, RN Pain Managment: General experience of comfort will improve 10/01/2017 0253 - Progressing by Loran Senters, RN Education: Understanding of cardiac disease, CV risk reduction, and recovery process will improve 10/01/2017 0253 - Progressing by Loran Senters, RN Cardiac: Ability to achieve and maintain adequate cardiovascular perfusion will improve 10/01/2017 0253 - Progressing by Loran Senters, RN

## 2017-10-01 NOTE — Progress Notes (Signed)
Explained to patient about his discharge orders, pt still complaining of chest pain. MD paged to notify and confirm discharge orders. MD stated he was cleared by cardiology and that he could go home. Paged cardiology, confirmed the chest pain was no cardiac related and that he could go home and follow up with his cardiologist in one week. Will continue to monitor patient. At this time pt has no questions and verbalizes understanding.

## 2017-10-05 ENCOUNTER — Ambulatory Visit: Payer: Medicare HMO | Admitting: Family

## 2017-10-05 ENCOUNTER — Telehealth: Payer: Self-pay | Admitting: Family

## 2017-10-05 NOTE — Progress Notes (Deleted)
Patient ID: Lawrence [name], male    DOB: 1956-09-22, 61 y.o.   MRN: [id number]  HPI  Mr Bolander is a 61 y/o male with a history of DM, PSVT, HTN, COPD (chronic oxygen), CAD with CABG, asthma, anxiety, anemia, atrial fibrillation, obstructive sleep apnea,  past tobacco use and chronic heart failure.   Last echo was done 03/27/16 and showed an EF of 60-65% along with trivial MR/TR. EF has improved from 45-50% November 2016. Most recent cardiac catheterization was done 11/02/16 and a DES was placed in the SVG to OM1 artery.   Admitted 09/30/17 due to chest pain. Cardiology consult was obtained. Labs were normal and he was discharged the following day. Was in the ED 06/18/17 due to hives where he was treated and released. Was in the ED 06/04/17 due to balanitis where he was treated and released. Was in the ED 05/03/17 with abdominal pain. Evaluated and discharged home.   He presents today for his follow-up visit with a chief complaint of   Past Medical History:  Diagnosis Date  . A-fib ([place])   . Anemia   . Anxiety   . Asthma   . CAD (coronary artery disease)    a. 2002 CABGx2 (LIMA->LAD, VG->VG->OM1);  b. 09/2012 DES->OM;  c. 03/2015 PTCA of LAD [facility]) in setting of atretic LIMA; d. 05/2015 Cath [facility]): nonobs dzs; e. 06/2015 Cath ([facility]): LM nl, LAD 45p/d ISR, 50d, D1/2 small, LCX 50p/d ISR, OM1 70ost, 30 ISR, VG->OM1 50ost, 80m LIMA->LAD 99p/d - atretic, RCA dom, nl; f.cath 10/16: 40-50%(FFR 0.90) pLAD, 75% (FFR 0.77) mLAD s/p PCI/DES, oRCA 40% (FFR0.95)  . Celiac disease   . Chronic diastolic CHF (congestive heart failure) ([place]    a. 06/2009 Echo: EF 60-65%, Gr 1 DD, triv AI, mildly dil LA, nl RV.  .Marland KitchenCOPD (chronic obstructive pulmonary disease) ([place]    a. Chronic bronchitis and emphysema.  .Marland KitchenDysrhythmia   . Essential hypertension   . History of tobacco abuse    a. Quit 2014.  .Marland KitchenPSVT (paroxysmal supraventricular tachycardia) ([place]    a. 10/2012 Noted on Zio Patch.  . Type II diabetes mellitus  ([place]    Past Surgical History:  Procedure Laterality Date  . BYPASS GRAFT    . Bypass Graft Angiography  04/06/2016   Performed [facility]onda [facility]ida [facility]  . CHOLECYSTECTOMY    . Coronary Stent Intervention N/A 11/02/2016   [facility]  . ESOPHAGEAL DILATION    . Left Heart Cath and Coronary Angiography N/A 04/06/2016   Performed by [facility]  . Left Heart Cath and Cors/Grafts Angiography Right 10/07/2015   Performed by KDionisio David MD at [facility]  . Left Heart Cath and Cors/Grafts Angiography N/A 07/12/2015   Performed by S[facility]va Crome MD at [facility]  . Left Heart Cath and Cors/Grafts Angiography and possible PCI N/A 11/02/2016   Performed by [facility]  . TONSILLECTOMY    . VASCULAR SURGERY     Family History  Problem Relation Age of Onset  . Heart attack Mother   . Depression Mother   . Heart disease Mother   . COPD Mother   . Hypertension Mother   . Heart attack Father   . Diabetes Father   . Depression Father   .  Heart disease Father   . Cirrhosis Father   . Parkinson's disease Brother    Social History   Tobacco Use  . Smoking status: Former Smoker    Last attempt to quit: 04/22/2013    Years since quitting: 4.4  . Smokeless tobacco: Never Used  Substance Use Topics  . Alcohol use: No    Comment: remotely quit alcohol use. Hx of heavy alcohol use.   Allergies  Allergen Reactions  . Demerol [Meperidine] Hives  . Prednisone Other (See Comments) and Hypertension    Pt states that this medication puts him in A-fib   . Sulfa Antibiotics Hives  . Albuterol Sulfate [Albuterol] Palpitations and Other (See Comments)    Pt currently uses this medication.    . Morphine Sulfate Nausea And Vomiting, Rash and Other (See Comments)    Pt states that he is only allergic to the tablet form of this  medication.      Review of Systems  Constitutional: Positive for fatigue (mild). Negative for appetite change.  HENT: Negative for congestion, rhinorrhea and sore throat.   Eyes: Negative.   Respiratory: Positive for cough and shortness of breath. Negative for chest tightness and wheezing.   Cardiovascular: Negative for chest pain, palpitations and leg swelling.  Gastrointestinal: Negative for abdominal distention, abdominal pain, constipation and nausea.  Endocrine: Negative.   Genitourinary: Negative.   Musculoskeletal: Positive for back pain (chronic). Negative for neck pain.  Skin: Negative.   Allergic/Immunologic: Negative.   Neurological: Negative for dizziness, weakness and light-headedness.  Hematological: Negative for adenopathy. Bruises/bleeds easily.  Psychiatric/Behavioral: Positive for sleep disturbance (wearing oxygen at 2L; not wearing CPAP due to space issues). Negative for dysphoric mood and suicidal ideas. The patient is not nervous/anxious.     Lab Results  Component Value Date   CREATININE 1.10 10/01/2017   CREATININE 1.07 09/30/2017   CREATININE 1.38 (H) 06/04/2017    Physical Exam  Constitutional: He is oriented to person, place, and time. He appears well-developed and well-nourished.  HENT:  Head: Normocephalic and atraumatic.  Neck: Normal range of motion. Neck supple. No JVD present.  Cardiovascular: Normal rate and regular rhythm.  Pulmonary/Chest: Effort normal. He has no wheezes. He has no rales.  Abdominal: Soft. He exhibits no distension. There is tenderness (above umbilicus).  Musculoskeletal: He exhibits no edema or tenderness.  Neurological: He is alert and oriented to person, place, and time.  Skin: Skin is warm and dry.  Psychiatric: He has a normal mood and affect. His behavior is normal. Thought content normal.  Nursing note and vitals reviewed.   Assessment & Plan:  1: Chronic heart failure with preserved ejection fraction- - NYHA  class II - euvolemic - weighing daily. Instructed to call for an overnight weight gain of >2 pounds or a weekly weight gain of >5 pounds. Weight down 14.4 pounds since he was last here.  - not adding salt and is currently rinsing canned foods. Reviewed the importance of closely following a 2022m sodium diet as he's eating quite a bit of microwave meals since he's still living in a motel - BMP drawn 10/01/17 reviewed; potassium 4.2 and GFR >60 - saw cardiologist ([name] 06/14/17 -[facility]. He will be moving into an apartment with [place] [employer] he gets furniture delivered on 06/26/17  2: Obstructive sleep apnea- - wears oxygen at 2L around the clock - has CPAP equipment but it's currently in storage as he's been living in a hotel with  family for the last year and doesn't have any space to place the machine - he is planning on getting his CPAP unpacked as soon as he moves into his apartment  3: HTN- - BP looks good today - saw PCP [name]) 08/31/17  4: COPD- - recently had pulmonary function test - continues to wear oxygen at 2L around the clock - sees pulmonologist Humphrey Rolls) 07/27/17  Patient did not bring his medications nor a list. Each medication was verbally reviewed with the patient and he was encouraged to bring the bottles to every visit to confirm accuracy of list.

## 2017-10-05 NOTE — Telephone Encounter (Signed)
Patient did not show for his [facility] appointment on 10/05/17. Will attempt to reschedule.

## 2017-10-06 ENCOUNTER — Other Ambulatory Visit: Payer: Self-pay | Admitting: Family Medicine

## 2017-10-06 NOTE — Telephone Encounter (Signed)
Pt contacted office for refill request on the following medications:  oxyCODONE-acetaminophen (PERCOCET) 10-325 MG tablet   CB#[phone]/MW

## 2017-10-07 DIAGNOSIS — E119 Type 2 diabetes mellitus without complications: Secondary | ICD-10-CM | POA: Diagnosis not present

## 2017-10-07 DIAGNOSIS — K219 Gastro-esophageal reflux disease without esophagitis: Secondary | ICD-10-CM | POA: Diagnosis not present

## 2017-10-07 DIAGNOSIS — Z87891 Personal history of nicotine dependence: Secondary | ICD-10-CM | POA: Diagnosis not present

## 2017-10-07 DIAGNOSIS — K529 Noninfective gastroenteritis and colitis, unspecified: Secondary | ICD-10-CM | POA: Diagnosis not present

## 2017-10-07 DIAGNOSIS — I2510 Atherosclerotic heart disease of native coronary artery without angina pectoris: Secondary | ICD-10-CM | POA: Diagnosis not present

## 2017-10-07 DIAGNOSIS — R109 Unspecified abdominal pain: Secondary | ICD-10-CM | POA: Diagnosis not present

## 2017-10-07 DIAGNOSIS — Z885 Allergy status to narcotic agent status: Secondary | ICD-10-CM | POA: Diagnosis not present

## 2017-10-07 DIAGNOSIS — R1013 Epigastric pain: Secondary | ICD-10-CM | POA: Diagnosis not present

## 2017-10-07 DIAGNOSIS — I10 Essential (primary) hypertension: Secondary | ICD-10-CM | POA: Diagnosis not present

## 2017-10-07 DIAGNOSIS — J449 Chronic obstructive pulmonary disease, unspecified: Secondary | ICD-10-CM | POA: Diagnosis not present

## 2017-10-08 IMAGING — CR DG HIP (WITH OR WITHOUT PELVIS) 2-3V*R*
1 series · 3 of 3 positions shown · non-contrast
Comparison: No dedicated right hip imaging. Reformats from CT
abdomen/pelvis 10/21/2015 reviewed

CLINICAL DATA: Right hip pain with movement. Pain onset yesterday,
progressive. No known injury.

EXAM:
DG HIP (WITH OR WITHOUT PELVIS) 2-3V RIGHT

[Series 1: t pelvis ap · 0.14mm/px · 3 of 3 slices shown]
[im 1/3]
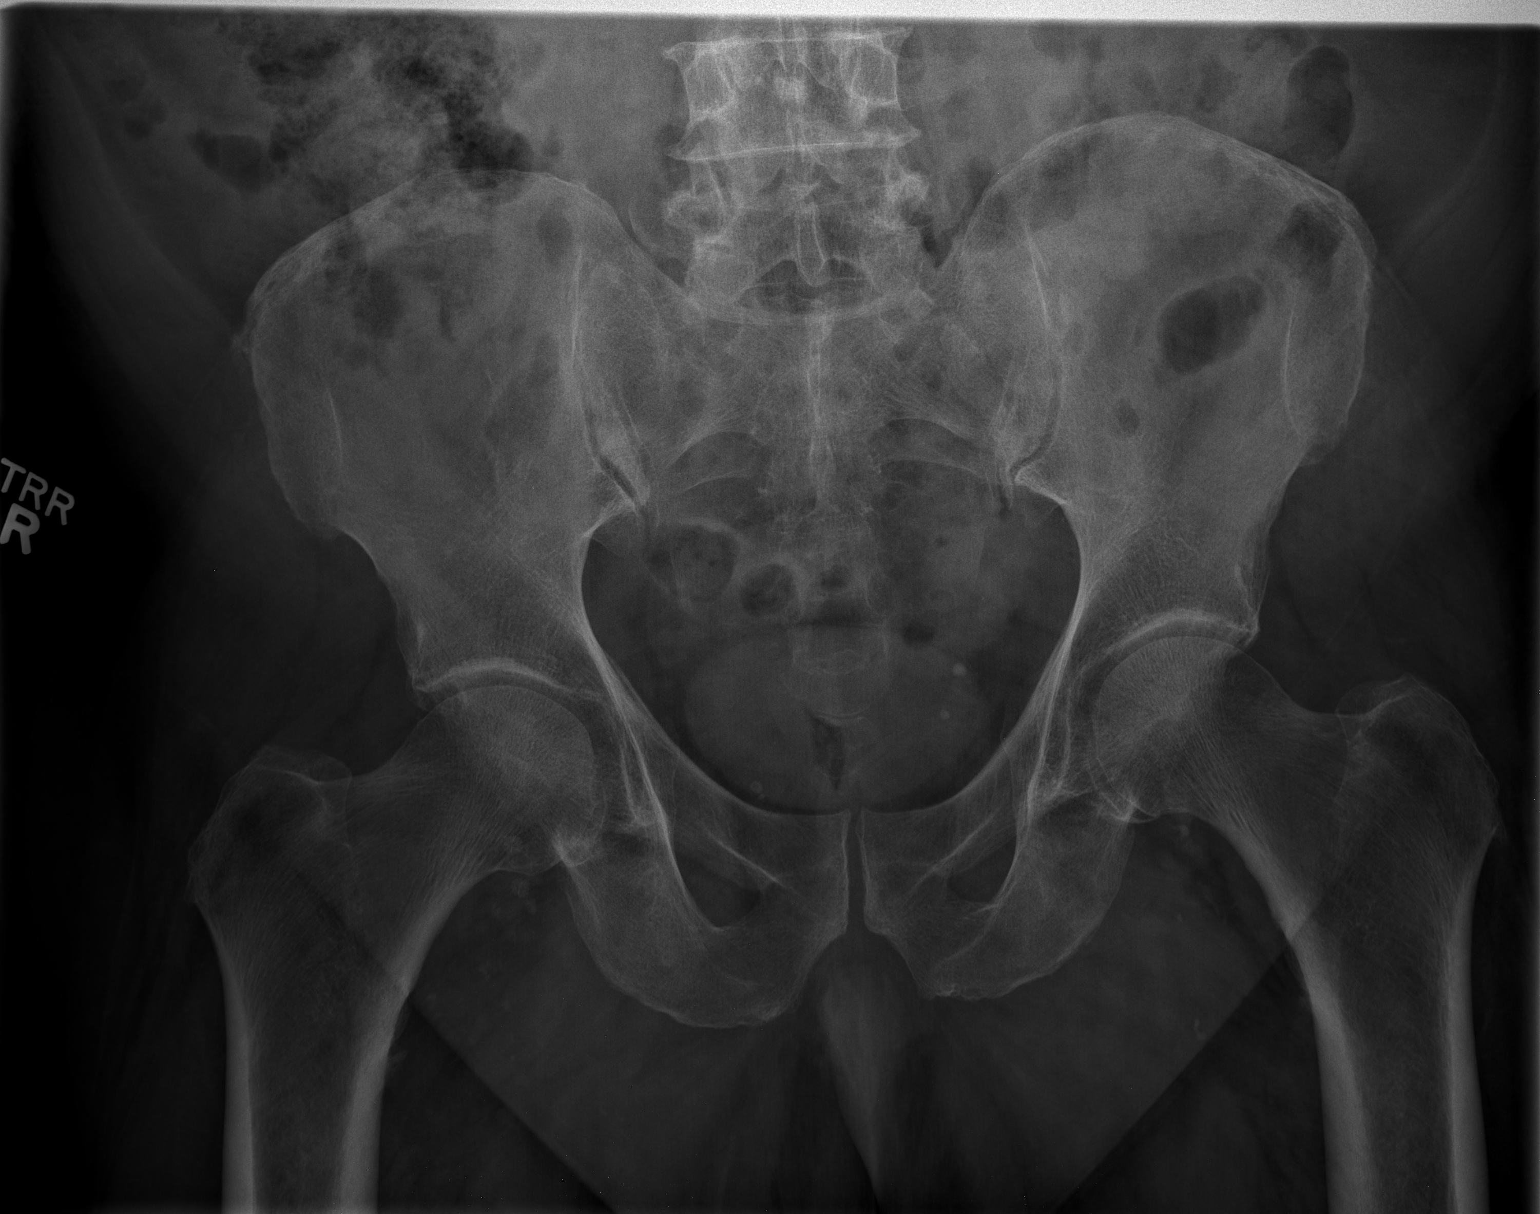
[im 2/3]
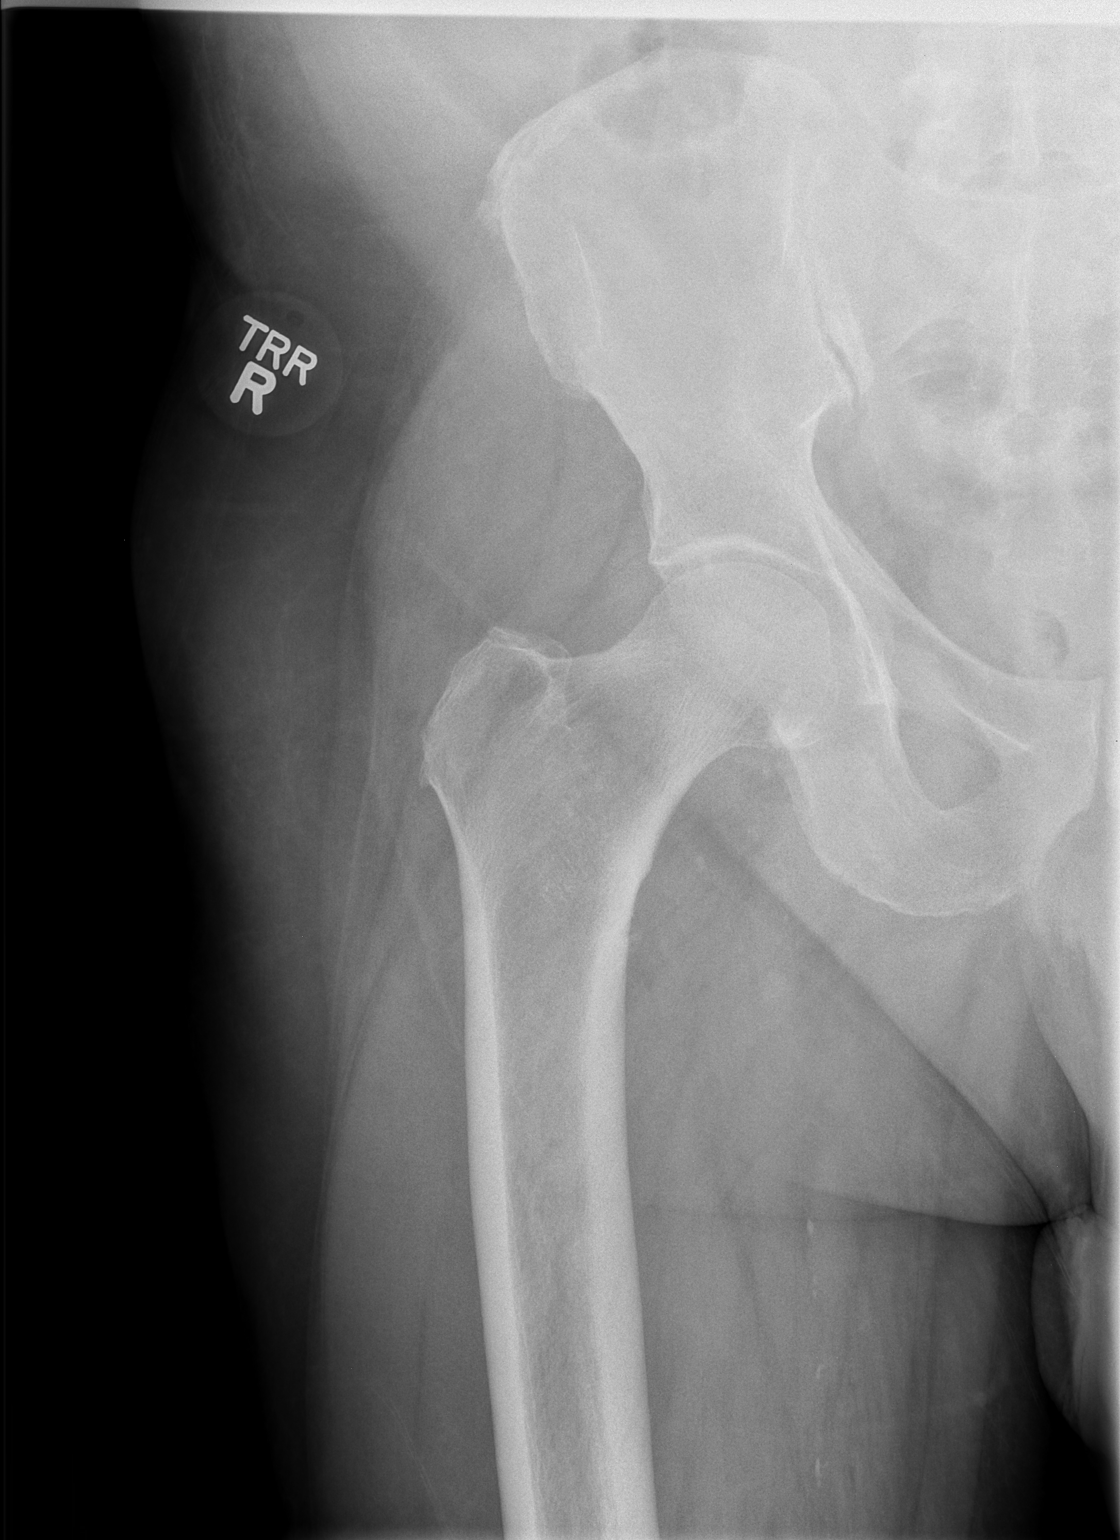
[im 3/3]
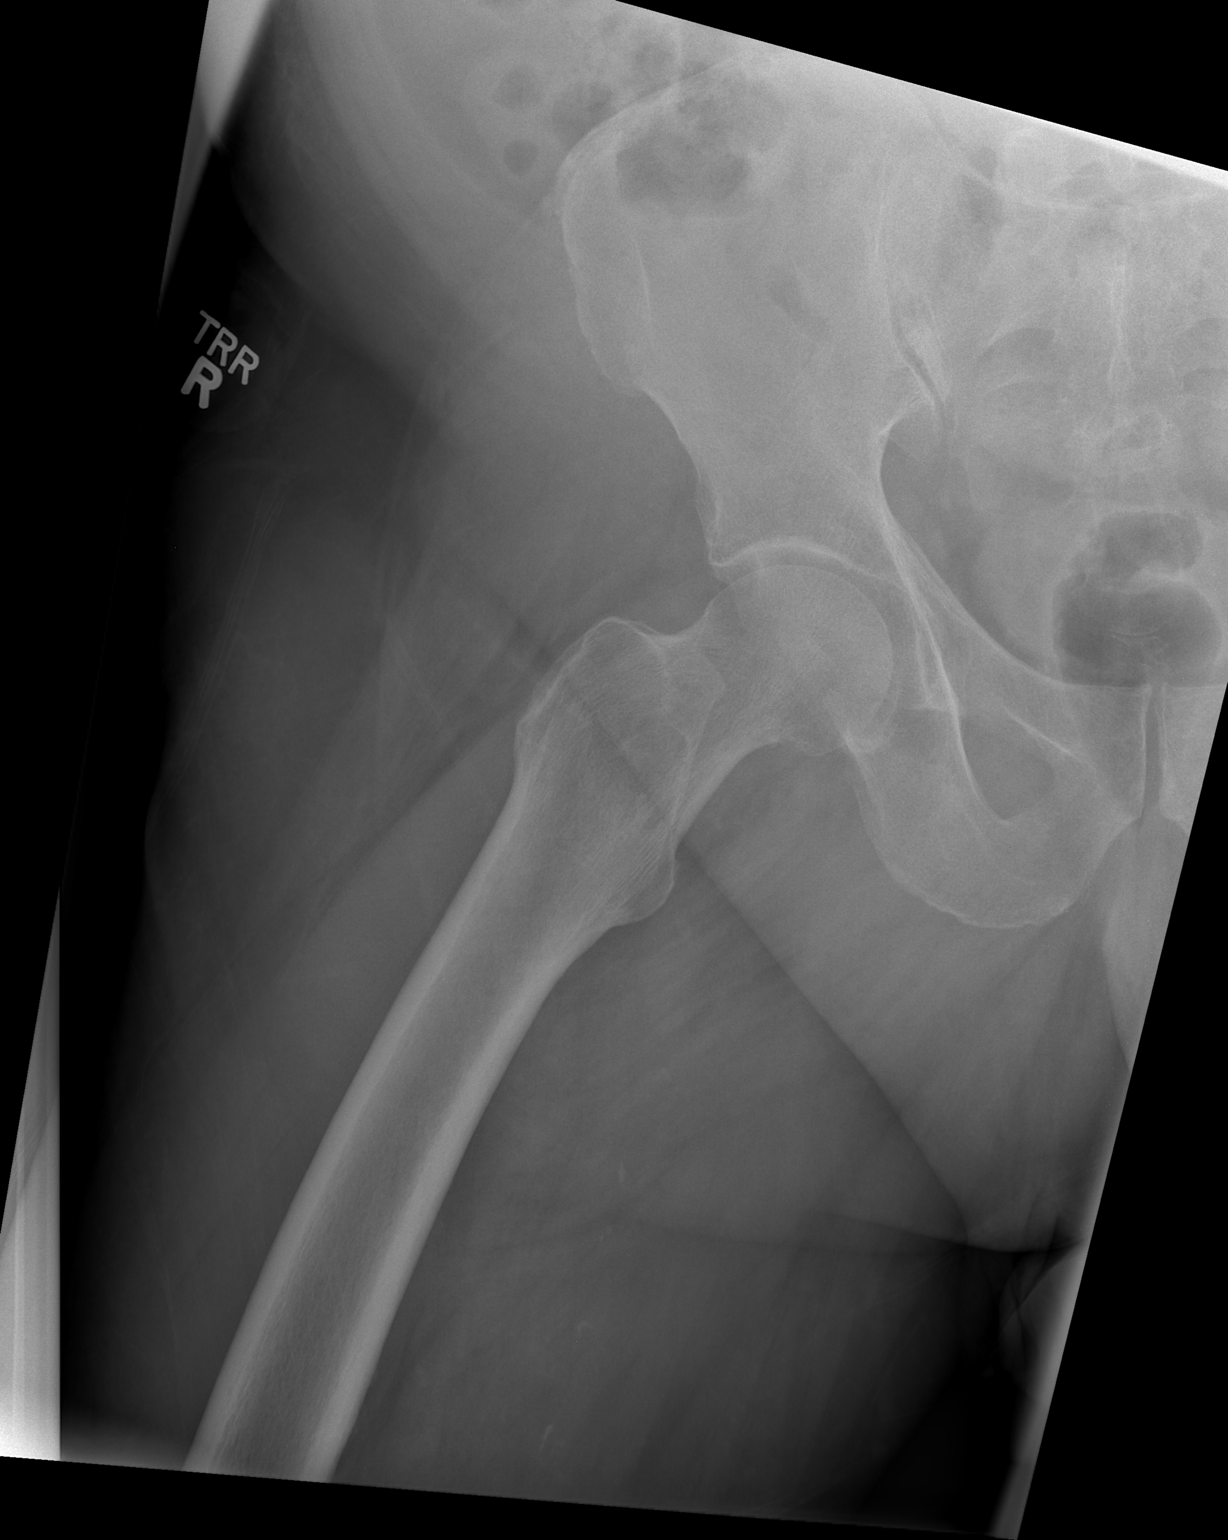

[3 of 3 positions shown; findings below may reference images not displayed]

FINDINGS: The cortical margins of the bony pelvis and right hip are intact. No
fracture. Pubic symphysis and sacroiliac joints are congruent. Both
femoral heads are well-seated in the respective acetabula.
IMPRESSION: No acute bony abnormality.

## 2017-10-08 MED ORDER — OXYCODONE-ACETAMINOPHEN 10-325 MG PO TABS: 1.0000 | ORAL_TABLET | ORAL | 0 refills | Status: DC | PRN

## 2017-10-08 NOTE — Telephone Encounter (Signed)
Please advise patient that oxycodone/apap  prescription has been sent electronically to   [place], [place] - [address] [address] Phone: [phone] Fax: [phone]

## 2017-10-11 DIAGNOSIS — E782 Mixed hyperlipidemia: Secondary | ICD-10-CM | POA: Diagnosis not present

## 2017-10-11 DIAGNOSIS — I25118 Atherosclerotic heart disease of native coronary artery with other forms of angina pectoris: Secondary | ICD-10-CM | POA: Diagnosis not present

## 2017-10-11 DIAGNOSIS — I10 Essential (primary) hypertension: Secondary | ICD-10-CM | POA: Diagnosis not present

## 2017-10-11 DIAGNOSIS — I480 Paroxysmal atrial fibrillation: Secondary | ICD-10-CM | POA: Diagnosis not present

## 2017-10-12 ENCOUNTER — Ambulatory Visit (INDEPENDENT_AMBULATORY_CARE_PROVIDER_SITE_OTHER): Payer: Medicare HMO | Admitting: Family Medicine

## 2017-10-12 ENCOUNTER — Encounter: Payer: Self-pay | Admitting: Family Medicine

## 2017-10-12 VITALS — BP 98/64 | HR 59 | Temp 97.9°F | Resp 16 | Ht 67.0 in | Wt 243.0 lb

## 2017-10-12 DIAGNOSIS — I951 Orthostatic hypotension: Secondary | ICD-10-CM | POA: Diagnosis not present

## 2017-10-12 DIAGNOSIS — J449 Chronic obstructive pulmonary disease, unspecified: Secondary | ICD-10-CM | POA: Diagnosis not present

## 2017-10-12 DIAGNOSIS — R079 Chest pain, unspecified: Secondary | ICD-10-CM | POA: Diagnosis not present

## 2017-10-12 DIAGNOSIS — G4710 Hypersomnia, unspecified: Secondary | ICD-10-CM | POA: Diagnosis not present

## 2017-10-12 DIAGNOSIS — J439 Emphysema, unspecified: Secondary | ICD-10-CM | POA: Diagnosis not present

## 2017-10-12 DIAGNOSIS — I10 Essential (primary) hypertension: Secondary | ICD-10-CM | POA: Diagnosis not present

## 2017-10-12 NOTE — Progress Notes (Signed)
Patient: Lawrence [name] Male    DOB: 12/15/1955   61 y.o.   MRN: [id number] Visit Date: 10/12/2017  Today's Provider: [name], MD   Chief Complaint  Patient presents with  . Hospitalization Follow-up   Subjective:    HPI   Follow up Hospitalization  Patient was admitted to [facility] on 09/30/2017 and discharged on 10/01/2017. He was treated for; Chest pain, CHF, CAD. Treatment for this included; Troponin level negative. Chest pain was atypical and no further workup. Patient advised to follow-up with Dr. [name] as outpatient, per Dr. Clayborn Bigness.   He reports good compliance with treatment. He reports this condition is Worse.  ----------------------------------------------------------------    Patient states that he feels worse then when he was in hospital. Patient saw Dr. [name] yesterday and was put on 24 hour Holter monitor which he has finished. He is scheduled to see GI tomorrow. He states he has felt light headed and dizzy whenever he states since being out of the hospital.   Allergies  Allergen Reactions  . Demerol [Meperidine] Hives  . Prednisone Other (See Comments) and Hypertension    Pt states that this medication puts him in A-fib   . Sulfa Antibiotics Hives  . Albuterol Sulfate [Albuterol] Palpitations and Other (See Comments)    Pt currently uses this medication.    . Morphine Sulfate Nausea And Vomiting, Rash and Other (See Comments)    Pt states that he is only allergic to the tablet form of this medication.       Current Outpatient Medications:  .  albuterol (PROVENTIL HFA;VENTOLIN HFA) 108 (90 Base) MCG/ACT inhaler, Inhale 4-6 puffs into the lungs every 4 (four) hours as needed for wheezing or shortness of breath. Reported on 04/08/2016, Disp: , Rfl:  .  allopurinol (ZYLOPRIM) 100 MG tablet, TAKE 3 TABLETS BY MOUTH EVERY DAY., Disp: 90 tablet, Rfl: 4 .  ALPRAZolam (XANAX) 1 MG tablet, TAKE ONE-HALF TO ONE TABLET BY MOUTH THREE TIMES DAILY AS  NEEDED FOR ANXIETY, Disp: 90 tablet, Rfl: 5 .  aspirin 81 MG chewable tablet, Chew 81 mg by mouth daily., Disp: , Rfl:  .  atorvastatin (LIPITOR) 80 MG tablet, TAKE ONE TABLET BY MOUTH EVERY NIGHT AT BEDTIME, Disp: 90 tablet, Rfl: 4 .  BANOPHEN 25 MG capsule, TAKE 1 CAPSULE (25MG TOTAL) BY MOUTH EVERY 4 (FOUR) HOURS AS NEEDED., Disp: 30 capsule, Rfl: 5 .  cetirizine (ZYRTEC) 10 MG tablet, TAKE ONE TABLET BY MOUTH AT BEDTIME. (Patient taking differently: 1-2 tid prn), Disp: 30 tablet, Rfl: 11 .  cyclobenzaprine (FLEXERIL) 5 MG tablet, Take 1-2 tablets (5-10 mg total) by mouth 3 (three) times daily as needed (back pain)., Disp: 60 tablet, Rfl: 5 .  docusate sodium (COLACE) 100 MG capsule, Take 100 mg by mouth daily. , Disp: , Rfl:  .  ELIQUIS 5 MG TABS tablet, TAKE 1 TABLET BY MOUTH TWICE A DAY., Disp: 60 tablet, Rfl: 12 .  famotidine (PEPCID) 40 MG tablet, Take 1 tablet (40 mg total) by mouth every evening., Disp: 30 tablet, Rfl: 11 .  fluticasone furoate-vilanterol (BREO ELLIPTA) 100-25 MCG/INH AEPB, Inhale 1 puff into the lungs daily. , Disp: , Rfl:  .  glucose blood (ACCU-CHEK AVIVA PLUS) test strip, Use to check blood sugar once a day, Disp: 100 each, Rfl: 4 .  hydrocortisone 1 % ointment, Apply 1 application topically 2 (two) times daily. Apply to rash, Disp: 30 g, Rfl: 0 .  isosorbide mononitrate (IMDUR)  60 MG 24 hr tablet, Take 1 tablet (60 mg total) by mouth daily. (Patient taking differently: Take 60 mg by mouth 2 (two) times daily. ), Disp: 60 tablet, Rfl: 1 .  JARDIANCE 10 MG TABS tablet, TAKE 1 TABLET BY MOUTH EVERY DAY, Disp: 30 tablet, Rfl: 12 .  lansoprazole (PREVACID) 30 MG capsule, Take 30 mg by mouth 2 (two) times daily. , Disp: , Rfl:  .  lisinopril (PRINIVIL,ZESTRIL) 2.5 MG tablet, TAKE 1 TABLET BY MOUTH EVERY DAY, Disp: 30 tablet, Rfl: 5 .  Magnesium Oxide 400 (240 Mg) MG TABS, TAKE ONE TABLET BY MOUTH DAILY, Disp: 30 tablet, Rfl: 12 .  Magnesium Oxide 400 (240 Mg) MG TABS, TAKE  1 TABLET BY MOUTH EVERY DAY, Disp: 30 tablet, Rfl: 5 .  metFORMIN (GLUCOPHAGE) 500 MG tablet, TAKE 1 TABLET BY MOUTH EVERY MORNING., Disp: 90 tablet, Rfl: 4 .  nitroGLYCERIN (NITROSTAT) 0.4 MG SL tablet, Place 1 tablet (0.4 mg total) under the tongue every 5 (five) minutes as needed for chest pain. Reported on 05/26/2016, Disp: 30 tablet, Rfl: 1 .  omega-3 acid ethyl esters (LOVAZA) 1 g capsule, TAKE FOUR CAPSULES BY MOUTH DAILY, Disp: 120 capsule, Rfl: 12 .  oxyCODONE-acetaminophen (PERCOCET) 10-325 MG tablet, Take 1 tablet by mouth every 4 (four) hours as needed., Disp: 150 tablet, Rfl: 0 .  RANEXA 500 MG 12 hr tablet, Take 1,000 mg by mouth 2 (two) times daily., Disp: , Rfl:  .  sotalol (BETAPACE) 120 MG tablet, Take 120 mg by mouth 2 (two) times daily. , Disp: , Rfl:  .  SPIRIVA HANDIHALER 18 MCG inhalation capsule, INHALE 1 CAPSULE VIA INHALER ONCE A DAY, Disp: 30 capsule, Rfl: 12 .  sucralfate (CARAFATE) 1 g tablet, Take 1 tablet (1 g total) by mouth 2 (two) times daily., Disp: 20 tablet, Rfl: 0  Review of Systems  Constitutional: Negative for appetite change, chills and fever.  Respiratory: Negative for chest tightness, shortness of breath and wheezing.   Cardiovascular: Negative for chest pain and palpitations.  Gastrointestinal: Negative for abdominal pain, nausea and vomiting.    Social History   Tobacco Use  . Smoking status: Former Smoker    Last attempt to quit: 04/22/2013    Years since quitting: 4.4  . Smokeless tobacco: Never Used  Substance Use Topics  . Alcohol use: No    Comment: remotely quit alcohol use. Hx of heavy alcohol use.   Objective:   BP 98/64 (BP Location: Right Arm, Patient Position: Sitting, Cuff Size: Large)   Pulse (!) 59   Temp 97.9 F (36.6 C) (Oral)   Resp 16   Ht 5' 7"  (1.702 m)   Wt 243 lb (110.2 kg)   SpO2 90%   BMI 38.06 kg/m  Vitals:   10/12/17 1007  BP: 98/64  Pulse: (!) 59  Resp: 16  Temp: 97.9 F (36.6 C)  TempSrc: Oral  SpO2:  90%  Weight: 243 lb (110.2 kg)  Height: 5' 7"  (1.702 m)   BP Readings from Last 3 Encounters:  10/12/17 98/64  10/01/17 118/82  08/31/17 (!) 142/82      Physical Exam   General Appearance:    Alert, cooperative, no distress  Eyes:    PERRL, conjunctiva/corneas clear, EOM's intact       Lungs:     Clear to auscultation bilaterally, respirations unlabored  Heart:    Regular rate and rhythm  Neurologic:   Awake, alert, oriented x 3. No apparent focal neurological  defect.           Assessment & Plan:     1. Orthostasis Advised to increase water consumption. If his BP remains low and light headedness does not improve he can put lisinopril on hold, so long as BP does not get above 140.   2. Chest pain, unspecified type Likely GI in nature. Is schedule to follow up at [facility]. Gi tomorrow.        [name], MD  [facility]

## 2017-10-12 NOTE — Patient Instructions (Signed)
   Drink more water, at least 8 glasses a day   If you continue to be dizzy when standing up then put lisinopril on hold until your follow up with cardiology

## 2017-10-13 ENCOUNTER — Other Ambulatory Visit: Payer: Self-pay

## 2017-10-13 ENCOUNTER — Emergency Department
Admission: EM | Admit: 2017-10-13 | Discharge: 2017-10-13 | Disposition: A | Discharge status: Home / Self Care | Payer: Medicare HMO | Attending: Emergency Medicine | Admitting: Emergency Medicine

## 2017-10-13 ENCOUNTER — Encounter: Payer: Self-pay | Admitting: Emergency Medicine

## 2017-10-13 DIAGNOSIS — R1032 Left lower quadrant pain: Secondary | ICD-10-CM | POA: Insufficient documentation

## 2017-10-13 DIAGNOSIS — Z862 Personal history of diseases of the blood and blood-forming organs and certain disorders involving the immune mechanism: Secondary | ICD-10-CM | POA: Diagnosis not present

## 2017-10-13 DIAGNOSIS — I110 Hypertensive heart disease with heart failure: Secondary | ICD-10-CM | POA: Diagnosis not present

## 2017-10-13 DIAGNOSIS — R110 Nausea: Secondary | ICD-10-CM | POA: Diagnosis not present

## 2017-10-13 DIAGNOSIS — I5032 Chronic diastolic (congestive) heart failure: Secondary | ICD-10-CM | POA: Insufficient documentation

## 2017-10-13 DIAGNOSIS — I2510 Atherosclerotic heart disease of native coronary artery without angina pectoris: Secondary | ICD-10-CM | POA: Insufficient documentation

## 2017-10-13 DIAGNOSIS — I129 Hypertensive chronic kidney disease with stage 1 through stage 4 chronic kidney disease, or unspecified chronic kidney disease: Secondary | ICD-10-CM | POA: Insufficient documentation

## 2017-10-13 DIAGNOSIS — R109 Unspecified abdominal pain: Secondary | ICD-10-CM

## 2017-10-13 DIAGNOSIS — Z951 Presence of aortocoronary bypass graft: Secondary | ICD-10-CM | POA: Insufficient documentation

## 2017-10-13 DIAGNOSIS — J45909 Unspecified asthma, uncomplicated: Secondary | ICD-10-CM | POA: Diagnosis not present

## 2017-10-13 DIAGNOSIS — Z7982 Long term (current) use of aspirin: Secondary | ICD-10-CM | POA: Insufficient documentation

## 2017-10-13 DIAGNOSIS — G8929 Other chronic pain: Secondary | ICD-10-CM | POA: Insufficient documentation

## 2017-10-13 DIAGNOSIS — J449 Chronic obstructive pulmonary disease, unspecified: Secondary | ICD-10-CM | POA: Diagnosis not present

## 2017-10-13 DIAGNOSIS — Z87891 Personal history of nicotine dependence: Secondary | ICD-10-CM | POA: Diagnosis not present

## 2017-10-13 DIAGNOSIS — E1122 Type 2 diabetes mellitus with diabetic chronic kidney disease: Secondary | ICD-10-CM | POA: Diagnosis not present

## 2017-10-13 DIAGNOSIS — R1012 Left upper quadrant pain: Secondary | ICD-10-CM | POA: Insufficient documentation

## 2017-10-13 DIAGNOSIS — N189 Chronic kidney disease, unspecified: Secondary | ICD-10-CM | POA: Diagnosis not present

## 2017-10-13 LAB — COMPREHENSIVE METABOLIC PANEL
ALT: 16 U/L — ABNORMAL LOW (ref 17–63)
AST: 20 U/L (ref 15–41)
Albumin: 3.6 g/dL (ref 3.5–5.0)
Alkaline Phosphatase: 84 U/L (ref 38–126)
Anion gap: 8 (ref 5–15)
BUN: 13 mg/dL (ref 6–20)
CO2: 28 mmol/L (ref 22–32)
Calcium: 9.1 mg/dL (ref 8.9–10.3)
Chloride: 99 mmol/L — ABNORMAL LOW (ref 101–111)
Creatinine, Ser: 1.2 mg/dL (ref 0.61–1.24)
GFR calc Af Amer: >60 mL/min (ref >60)
GFR calc non Af Amer: >60 mL/min (ref >60)
Glucose, Bld: 104 mg/dL — ABNORMAL HIGH (ref 65–99)
Potassium: 4.3 mmol/L (ref 3.5–5.1)
Sodium: 135 mmol/L (ref 135–145)
Total Bilirubin: 0.7 mg/dL (ref 0.3–1.2)
Total Protein: 6.6 g/dL (ref 6.5–8.1)

## 2017-10-13 LAB — CBC
HCT: 49.8 % (ref 40.0–52.0)
Hemoglobin: 16 g/dL (ref 13.0–18.0)
MCH: 28.7 pg (ref 26.0–34.0)
MCHC: 32.2 g/dL (ref 32.0–36.0)
MCV: 89.3 fL (ref 80.0–100.0)
Platelets: 167 10*3/uL (ref 150–440)
RBC: 5.57 MIL/uL (ref 4.40–5.90)
RDW: 16.7 % — ABNORMAL HIGH (ref 11.5–14.5)
WBC: 8.1 10*3/uL (ref 3.8–10.6)

## 2017-10-13 LAB — URINALYSIS, COMPLETE (UACMP) WITH MICROSCOPIC
Bacteria, UA: NONE SEEN
Bilirubin Urine: NEGATIVE
Glucose, UA: >=500 mg/dL — AB
Hgb urine dipstick: NEGATIVE
Ketones, ur: NEGATIVE mg/dL
Leukocytes, UA: NEGATIVE
Nitrite: NEGATIVE
Protein, ur: NEGATIVE mg/dL
Specific Gravity, Urine: 1.008 (ref 1.005–1.030)
pH: 6 (ref 5.0–8.0)

## 2017-10-13 LAB — TROPONIN I: Troponin I: <0.03 ng/mL (ref <0.03)

## 2017-10-13 LAB — LIPASE, BLOOD: Lipase: 20 U/L (ref 11–51)

## 2017-10-13 IMAGING — CR DG CHEST 2V
1 series · 2 of 2 positions shown · non-contrast
Comparison: 10/05/2015

CLINICAL DATA: Chest pain and shortness of breath beginning
yesterday worsening today, BILATERAL lower extremity swelling and
abdominal swelling, LEFT arm numbness, history CHF, COPD, coronary
artery disease post stenting, diabetes mellitus, asthma,
hypertension

EXAM:
CHEST  2 VIEW

[Series 1: dg chest 2 view · 0.14mm/px · 2 of 2 slices shown]
[im 1/2]
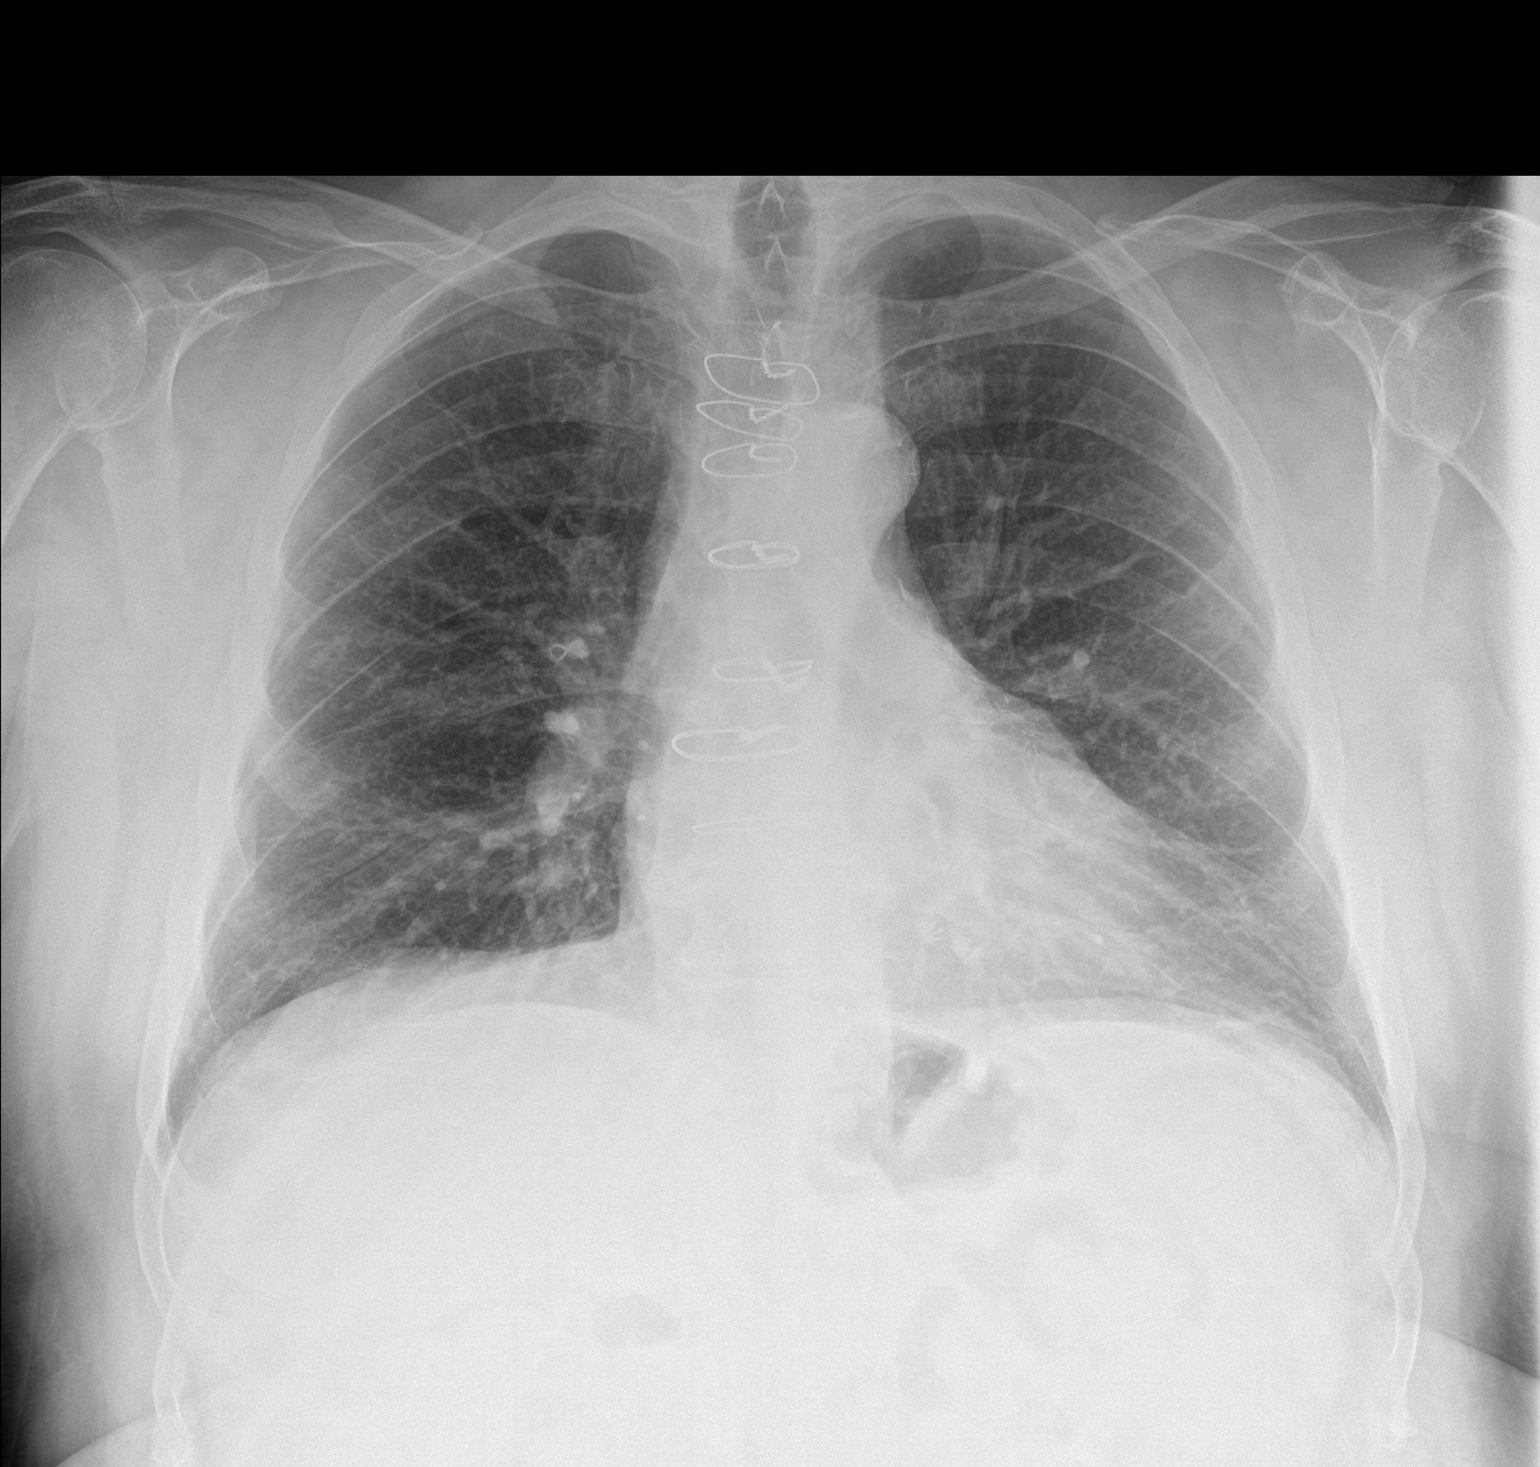
[im 2/2]
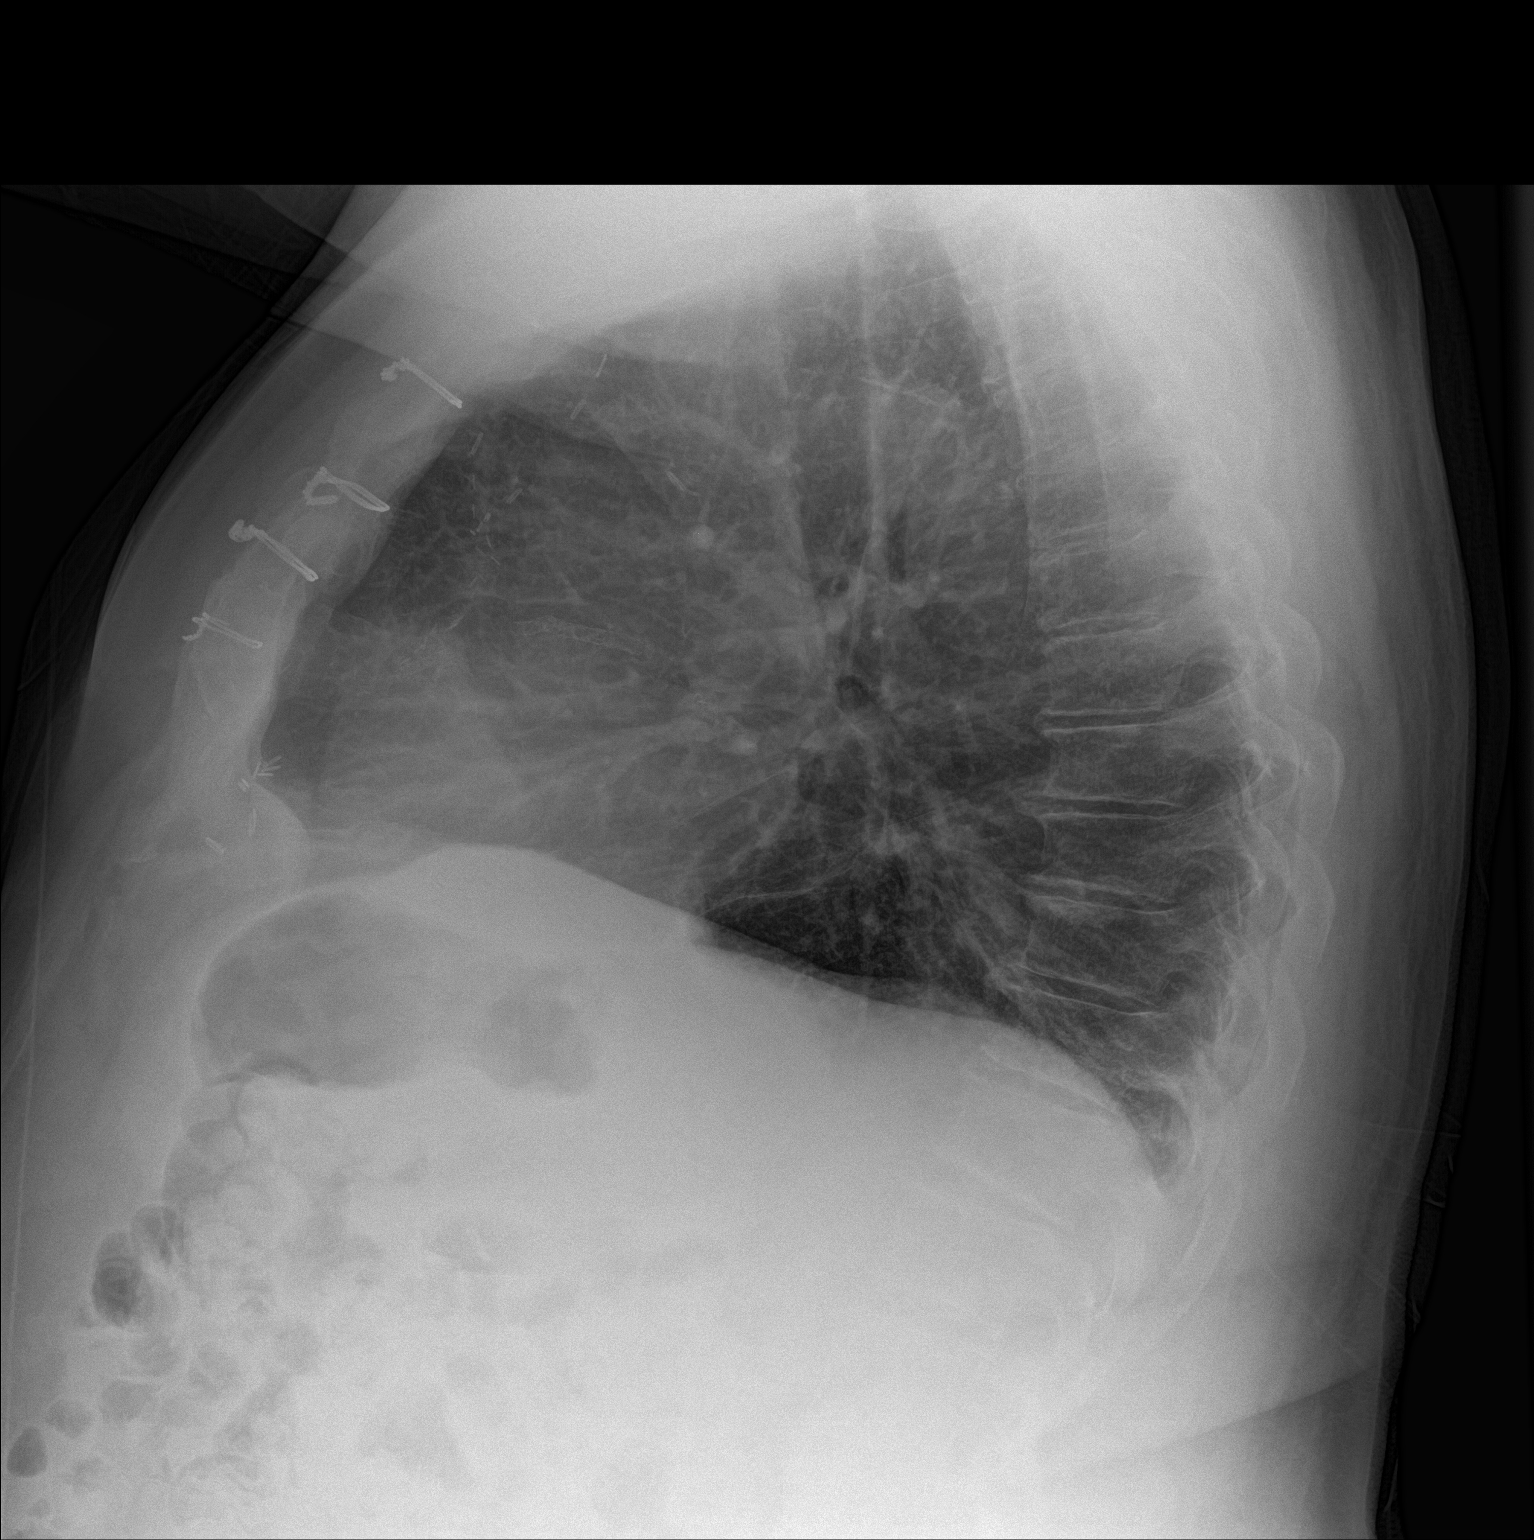

[2 of 2 positions shown; findings below may reference images not displayed]

FINDINGS: Normal heart size post CABG.

Mediastinal contours and pulmonary vascularity normal.

Minimal atherosclerotic calcification aorta.

Bronchitic changes without infiltrate, pleural effusion or
pneumothorax.

Bones appear demineralized without acute focal abnormality.
IMPRESSION: Post CABG.

Minimal chronic bronchitic changes without infiltrate.

## 2017-10-13 NOTE — ED Triage Notes (Signed)
Pt to ED from home c/o abd pain LUQ x1 week.  States has hx of pancreatitis and feels exactly the same. Nausea, denies vomiting or diarrhea.

## 2017-10-13 NOTE — Discharge Instructions (Signed)
I am concerned there is a possibility that your abdominal pain could be caused by a low flow state from your blood vessels.  You have declined to stay for further testing which is not unreasonable.  If you have severe pain, fever, vomiting, bleeding from your bottom, vomiting of blood, vomiting at all, or any other new or worrisome symptoms return to the emergency room.  Follow closely with your GI doctor and the referral to vascular surgeon for further evaluation of your chronic pain.

## 2017-10-13 NOTE — ED Provider Notes (Addendum)
[facility] Note  ____________________________________________   I have reviewed the triage vital signs and the nursing notes.   HISTORY  Chief Complaint Abdominal Pain    HPI Lawrence [name] is a 61 y.o. male with a history of chronic abdominal pain who has had innumerable CT scans for persistent recurrent abdominal pain.  Patient did have pancreatitis years ago.  Patient states the pain is in the left side of his abdomen usually in his left lower quadrant sometimes left upper quadrant.  It seems to vary.  It is worse with food.  He has not had any weight loss no fever no vomiting or diarrhea no change in his stool.  Most recently he was seen 5 days ago for this pain he went to his primary care doctor and was referred to a GI specialist who is working him up for this as well.  Patient does have a history of oxygen requirement, COPD, and CAD.  He has no exertional symptoms with this pain it is always worse with food.  He is on chronic narcotics.  He states he does not feel constipated.  I am able to review the CT scans from [facility] which were reassuring.  Have also been able to review the 6 prior CT scans he has had in this institution since 2016 for this exact pain.  None of them showed any significant acute pathology.  There is, however, noted some atherosclerotic changes but no significant burden of atherosclerosis noted and patent arteries.    Past Medical History:  Diagnosis Date  . A-fib ([place])   . Anemia   . Anxiety   . Asthma   . CAD (coronary artery disease)    a. 2002 CABGx2 (LIMA->LAD, VG->VG->OM1);  b. 09/2012 DES->OM;  c. 03/2015 PTCA of LAD [facility]) in setting of atretic LIMA; d. 05/2015 Cath [facility]): nonobs dzs; e. 06/2015 Cath ([facility]): LM nl, LAD 45p/d ISR, 50d, D1/2 small, LCX 50p/d ISR, OM1 70ost, 30 ISR, VG->OM1 50ost, 26m LIMA->LAD 99p/d - atretic, RCA dom, nl; f.cath 10/16: 40-50%(FFR 0.90) pLAD, 75% (FFR 0.77) mLAD s/p  PCI/DES, oRCA 40% (FFR0.95)  . Celiac disease   . Chronic diastolic CHF (congestive heart failure) ([facility]    a. 06/2009 Echo: EF 60-65%, Gr 1 DD, triv AI, mildly dil LA, nl RV.  .Marland KitchenCOPD (chronic obstructive pulmonary disease) ([facility]    a. Chronic bronchitis and emphysema.  .Marland KitchenDysrhythmia   . Essential hypertension   . History of tobacco abuse    a. Quit 2014.  .Marland KitchenPSVT (paroxysmal supraventricular tachycardia) ([facility]    a. 10/2012 Noted on Zio Patch.  . Type II diabetes mellitus ([facility]     Patient Active Problem List   Diagnosis Date Noted  . Bilateral flank pain 03/24/2017  . Dyspnea 04/03/2016  . Hypotension 04/03/2016  . Chronic kidney disease 04/03/2016  . Anemia 04/03/2016  . Paroxysmal atrial fibrillation ([facility] 12/23/2015  . OSA (obstructive sleep apnea) 12/10/2015  . Left inguinal hernia 11/07/2015  . Anxiety 11/07/2015  . Unstable angina ([facility] 10/05/2015  . Back pain with left-sided radiculopathy 09/30/2015  . Nocturnal hypoxia 09/06/2015  . BPH (benign prostatic hyperplasia) 08/01/2015  . Chronic diastolic CHF (congestive heart failure) ([facility]   . Angina pectoris ([facility]   . Chest pain 07/11/2015  . COPD (chronic obstructive pulmonary disease) ([facility] 07/03/2015  . CAD (coronary artery disease) 06/26/2015  . HTN (hypertension) 06/26/2015  . DM (diabetes mellitus) ([facility] 06/26/2015  . Achalasia  07/24/2014  . GERD (gastroesophageal reflux disease) 06/07/2014  . Former tobacco use 04/11/2013  . HLD (hyperlipidemia) 04/09/2013    Past Surgical History:  Procedure Laterality Date  . BYPASS GRAFT    . CARDIAC CATHETERIZATION N/A 07/12/2015   rocedure: Left Heart Cath and Cors/Grafts Angiography;  Surgeon: Belva Crome, MD;  Location: [place] [facility];  Service: Cardiovascular;  Laterality: N/A;  . CARDIAC CATHETERIZATION Right 10/07/2015   Procedure: Left Heart Cath and Cors/Grafts Angiography;  Surgeon: Dionisio David, MD;  Location: [place] [facility];  Service:  Cardiovascular;  Laterality: Right;  . CARDIAC CATHETERIZATION N/A 04/06/2016   Procedure: Left Heart Cath and Coronary Angiography;  Surgeon: Yolonda Kida, MD;  Location: [place] [facility];  Service: Cardiovascular;  Laterality: N/A;  . CARDIAC CATHETERIZATION  04/06/2016   Procedure: Bypass Graft Angiography;  Surgeon: Yolonda Kida, MD;  Location: [place] [facility];  Service: Cardiovascular;;  . CARDIAC CATHETERIZATION N/A 11/02/2016   Procedure: Left Heart Cath and Cors/Grafts Angiography and possible PCI;  Surgeon: Yolonda Kida, MD;  Location: [place] [facility];  Service: Cardiovascular;  Laterality: N/A;  . CARDIAC CATHETERIZATION N/A 11/02/2016   Procedure: Coronary Stent Intervention;  Surgeon: Yolonda Kida, MD;  Location: [place] [facility];  Service: Cardiovascular;  Laterality: N/A;  . CHOLECYSTECTOMY    . ESOPHAGEAL DILATION    . TONSILLECTOMY    . VASCULAR SURGERY      Prior to Admission medications   Medication Sig Start Date End Date Taking? Authorizing Provider  albuterol (PROVENTIL HFA;VENTOLIN HFA) 108 (90 Base) MCG/ACT inhaler Inhale 4-6 puffs into the lungs every 4 (four) hours as needed for wheezing or shortness of breath. Reported on 04/08/2016    [provider]  allopurinol (ZYLOPRIM) 100 MG tablet TAKE 3 TABLETS BY MOUTH EVERY DAY. 09/11/17   Birdie Sons, MD  ALPRAZolam Duanne Moron) 1 MG tablet TAKE ONE-HALF TO ONE TABLET BY MOUTH THREE TIMES DAILY AS NEEDED FOR ANXIETY 09/28/17   Birdie Sons, MD  aspirin 81 MG chewable tablet Chew 81 mg by mouth daily.    [provider]  atorvastatin (LIPITOR) 80 MG tablet TAKE ONE TABLET BY MOUTH EVERY NIGHT AT BEDTIME 08/13/17   Birdie Sons, MD  BANOPHEN 25 MG capsule TAKE 1 CAPSULE (25MG TOTAL) BY MOUTH EVERY 4 (FOUR) HOURS AS NEEDED. 07/14/17   Birdie Sons, MD  cetirizine (ZYRTEC) 10 MG tablet TAKE ONE TABLET BY MOUTH AT BEDTIME. Patient taking differently: 1-2 tid prn  05/12/17   Birdie Sons, MD  cyclobenzaprine (FLEXERIL) 5 MG tablet Take 1-2 tablets (5-10 mg total) by mouth 3 (three) times daily as needed (back pain). 05/28/17   Birdie Sons, MD  docusate sodium (COLACE) 100 MG capsule Take 100 mg by mouth daily.     [provider]  ELIQUIS 5 MG TABS tablet TAKE 1 TABLET BY MOUTH TWICE A DAY. 07/28/17   Birdie Sons, MD  famotidine (PEPCID) 40 MG tablet Take 1 tablet (40 mg total) by mouth every evening. 05/12/17 05/12/18  Birdie Sons, MD  fluticasone furoate-vilanterol (BREO ELLIPTA) 100-25 MCG/INH AEPB Inhale 1 puff into the lungs daily.     [provider]  glucose blood (ACCU-CHEK AVIVA PLUS) test strip Use to check blood sugar once a day 11/26/16   Birdie Sons, MD  hydrocortisone 1 % ointment Apply 1 application topically 2 (two) times daily. Apply to rash 06/19/17  [name], MD  isosorbide mononitrate (IMDUR) 60 MG 24 hr tablet Take 1 tablet (60 mg total) by mouth daily. Patient taking differently: Take 60 mg by mouth 2 (two) times daily.  11/04/16   Henreitta Leber, MD  JARDIANCE 10 MG TABS tablet TAKE 1 TABLET BY MOUTH EVERY DAY 07/14/17   Birdie Sons, MD  lansoprazole (PREVACID) 30 MG capsule Take 30 mg by mouth 2 (two) times daily.     [provider]  lisinopril (PRINIVIL,ZESTRIL) 2.5 MG tablet TAKE 1 TABLET BY MOUTH EVERY DAY 08/13/17   Darylene Price A, FNP  Magnesium Oxide 400 (240 Mg) MG TABS TAKE ONE TABLET BY MOUTH DAILY 03/23/17   Birdie Sons, MD  Magnesium Oxide 400 (240 Mg) MG TABS TAKE 1 TABLET BY MOUTH EVERY DAY 09/11/17   Darylene Price A, FNP  metFORMIN (GLUCOPHAGE) 500 MG tablet TAKE 1 TABLET BY MOUTH EVERY MORNING. 10/05/16   Birdie Sons, MD  nitroGLYCERIN (NITROSTAT) 0.4 MG SL tablet Place 1 tablet (0.4 mg total) under the tongue every 5 (five) minutes as needed for chest pain. Reported on 05/26/2016 10/06/16   Birdie Sons, MD  omega-3 acid ethyl esters (LOVAZA) 1 g  capsule TAKE FOUR CAPSULES BY MOUTH DAILY 06/04/17   Birdie Sons, MD  oxyCODONE-acetaminophen (PERCOCET) 10-325 MG tablet Take 1 tablet by mouth every 4 (four) hours as needed. 10/08/17   Birdie Sons, MD  RANEXA 500 MG 12 hr tablet Take 1,000 mg by mouth 2 (two) times daily.    [provider]  sotalol (BETAPACE) 120 MG tablet Take 120 mg by mouth 2 (two) times daily.     [provider]  SPIRIVA HANDIHALER 18 MCG inhalation capsule INHALE 1 CAPSULE VIA INHALER ONCE A DAY 01/25/17   Birdie Sons, MD  sucralfate (CARAFATE) 1 g tablet Take 1 tablet (1 g total) by mouth 2 (two) times daily. 05/03/17   [name], MD  torsemide (DEMADEX) 100 MG tablet Take 0.5 tablets by mouth daily. 10/11/17   [provider]    Allergies Demerol [meperidine]; Prednisone; Sulfa antibiotics; Albuterol sulfate [albuterol]; and Morphine sulfate  Family History  Problem Relation Age of Onset  . Heart attack Mother   . Depression Mother   . Heart disease Mother   . COPD Mother   . Hypertension Mother   . Heart attack Father   . Diabetes Father   . Depression Father   . Heart disease Father   . Cirrhosis Father   . Parkinson's disease Brother     Social History Social History   Tobacco Use  . Smoking status: Former Smoker    Last attempt to quit: 04/22/2013    Years since quitting: 4.4  . Smokeless tobacco: Never Used  Substance Use Topics  . Alcohol use: No    Comment: remotely quit alcohol use. Hx of heavy alcohol use.  . Drug use: No    Review of Systems Constitutional: No fever/chills Eyes: No visual changes. ENT: No sore throat. No stiff neck no neck pain Cardiovascular: Denies chest pain. Respiratory: Denies shortness of breath. Gastrointestinal:   no vomiting.  No diarrhea.  No constipation. Genitourinary: Negative for dysuria. Musculoskeletal: Negative lower extremity swelling Skin: Negative for rash. Neurological: Negative for severe  headaches, focal weakness or numbness.   ____________________________________________   PHYSICAL EXAM:  VITAL SIGNS: ED Triage Vitals  Enc Vitals Group     BP 10/13/17 1954 (!) 146/83  Pulse Rate 10/13/17 1954 64     Resp 10/13/17 1954 20     Temp 10/13/17 1954 98.2 F (36.8 C)     Temp Source 10/13/17 1954 Oral     SpO2 10/13/17 1954 96 %     Weight 10/13/17 1954 240 lb (108.9 kg)     Height 10/13/17 1954 5' 7"  (1.702 m)     Head Circumference --      Peak Flow --      Pain Score 10/13/17 2007 9     Pain Loc --      Pain Edu? --      Excl. in [place]? --     Constitutional: Alert and oriented. Well appearing and in no acute distress. There are very comfortably no distress Eyes: Conjunctivae are normal Head: Atraumatic HEENT: No congestion/rhinnorhea. Mucous membranes are moist.  Oropharynx non-erythematous Neck:   Nontender with no meningismus, no masses, no stridor Cardiovascular: Normal rate, regular rhythm. Grossly normal heart sounds.  Good peripheral circulation. Respiratory: Normal respiratory effort.  No retractions. Lungs CTAB. Abdominal: Soft and minimal tenderness to palpation left lower quadrant. No distention. No guarding no rebound Back:  There is no focal tenderness or step off.  there is no midline tenderness there are no lesions noted. there is no CVA tenderness Musculoskeletal: No lower extremity tenderness, no upper extremity tenderness. No joint effusions, no DVT signs strong distal pulses no edema Neurologic:  Normal speech and language. No gross focal neurologic deficits are appreciated.  Skin:  Skin is warm, dry and intact. No rash noted. Psychiatric: Mood and affect are normal. Speech and behavior are normal.  ____________________________________________   LABS (all labs ordered are listed, but only abnormal results are displayed)  Labs Reviewed  CBC - Abnormal; Notable for the following components:      Result Value   RDW 16.7 (*)    All  other components within normal limits  COMPREHENSIVE METABOLIC PANEL - Abnormal; Notable for the following components:   Chloride 99 (*)    Glucose, Bld 104 (*)    ALT 16 (*)    All other components within normal limits  URINALYSIS, COMPLETE (UACMP) WITH MICROSCOPIC - Abnormal; Notable for the following components:   Color, Urine YELLOW (*)    APPearance CLEAR (*)    Glucose, UA >=500 (*)    Squamous Epithelial / LPF 0-5 (*)    All other components within normal limits  LIPASE, BLOOD  TROPONIN I    Pertinent labs  results that were available during my care of the patient were reviewed by me and considered in my medical decision making (see chart for details). ____________________________________________  EKG  I personally interpreted any EKGs ordered by me or triage Sinus rhythm at 63 bpm no acute ST elevation or depression normal axis, possible old anterior infarct no acute ischemia ____________________________________________  RADIOLOGY  Pertinent labs & imaging results that were available during my care of the patient were reviewed by me and considered in my medical decision making (see chart for details). If possible, patient and/or family made aware of any abnormal findings.  No results found. ____________________________________________    PROCEDURES  Procedure(s) performed: None  Procedures  Critical Care performed: None  ____________________________________________   INITIAL IMPRESSION / ASSESSMENT AND PLAN / ED COURSE  Pertinent labs & imaging results that were available during my care of the patient were reviewed by me and considered in my medical decision making (see chart for details).  Here with  chronic recurrent abdominal pain despite extensive imaging and outpatient workup.  Blood work is reassuring, nothing at this time to suggest that there is acute ischemia however I do have concern for possible chronic anginal gut symptoms.  Patient does have pain  with eating.  He is not losing weight fortunately.  There is some atherosclerotic changes on CT scan.  I have discussed with vascular surgery, Dr. Delana Meyer, who agrees with management does not feel further workup is indicated in the emergency room and will closely follow-up as an outpatient for Doppler studies.  I appreciate his consult.  Patient is already being taken care of by his primary care and GI and has had multiple different trips to the emergency room for this pain all of which resulted in negative CT scans I do not think repeat CT scan is of indication.  There is nothing at this time to suggest ACS PE or dissection or intrathoracic pathology for this discomfort which is chronic and food related, nor is there any evidence to suggest that the patient has acute ischemia I did offer him a lactic acid test to ensure that there is no evidence of ischemia even though I have low suspicion, and he refuses.  He states he would prefer to go home.  He states he has adequate pain control at home he is is curious about why he has pain for so long and so many years.  I feel that referral to vascular surgery may help at least rule out low flow state as a possible contributing factor to this patient's symptomology.  And I have given him extensive follow-up precautions which he understands.  Patient very happy with this plan.  Considering the patient's symptoms, medical history, and physical examination today, I have low suspicion for cholecystitis or biliary pathology, pancreatitis, perforation or bowel obstruction, hernia, intra-abdominal abscess, AAA or dissection, volvulus or intussusception, mesenteric ischemia, ischemic gut, pyelonephritis or appendicitis.    ____________________________________________   FINAL CLINICAL IMPRESSION(S) / ED DIAGNOSES  Final diagnoses:  None      This chart was dictated using voice recognition software.  Despite best efforts to proofread,  errors can occur which can change  meaning.      Schuyler Amor, MD 10/13/17 2148    Schuyler Amor, MD 10/13/17 2200

## 2017-10-13 NOTE — ED Notes (Addendum)
Pt to the er for pain r/t pancreatitis. Pt said he saw his internal medicine md today and had labs drawn but did not give pain meds. Also suggested he go on a liquid diet. Pt says he went to [facility] and they dx ulcers and gave no RX, told to follow up with internal medicine. Pt reports pain to the abdomen just above belly button. Pain and nausea with palpation. Pt was told enzymes were good at [facility].

## 2017-10-14 DIAGNOSIS — I480 Paroxysmal atrial fibrillation: Secondary | ICD-10-CM | POA: Diagnosis not present

## 2017-10-18 ENCOUNTER — Other Ambulatory Visit: Payer: Self-pay | Admitting: Student

## 2017-10-18 DIAGNOSIS — R1012 Left upper quadrant pain: Secondary | ICD-10-CM

## 2017-10-18 DIAGNOSIS — K529 Noninfective gastroenteritis and colitis, unspecified: Secondary | ICD-10-CM

## 2017-10-18 DIAGNOSIS — R933 Abnormal findings on diagnostic imaging of other parts of digestive tract: Secondary | ICD-10-CM

## 2017-10-20 ENCOUNTER — Other Ambulatory Visit: Payer: Self-pay | Admitting: Family Medicine

## 2017-10-20 MED ORDER — ALBUTEROL SULFATE HFA 108 (90 BASE) MCG/ACT IN AERS: 4.0000 | INHALATION_SPRAY | RESPIRATORY_TRACT | 5 refills | Status: DC | PRN

## 2017-10-20 NOTE — Telephone Encounter (Signed)
Pt contacted office for refill request on the following medications:  albuterol (PROVENTIL HFA;VENTOLIN HFA) 108 (90 Base) MCG/ACT inhaler   [place]  Pt stated that Dr. [name] has written this Rx for pt in the past. Please advise. Thanks TNP

## 2017-10-21 ENCOUNTER — Telehealth: Payer: Self-pay | Admitting: Family Medicine

## 2017-10-21 MED ORDER — ALBUTEROL SULFATE HFA 108 (90 BASE) MCG/ACT IN AERS: 2.0000 | INHALATION_SPRAY | RESPIRATORY_TRACT | 5 refills | Status: DC | PRN

## 2017-10-21 NOTE — Telephone Encounter (Signed)
done

## 2017-10-21 NOTE — Telephone Encounter (Signed)
Please advise 

## 2017-10-21 NOTE — Telephone Encounter (Signed)
Lawrence [name] with [facility] called stating insurance will not cover the Rx albuterol (PROVENTIL HFA;VENTOLIN HFA) 108 (90 Base) MCG/ACT inhaler due to the dosage being 4 to 6 puffs every 4 hours.  Insurance will cover 2 puffs every 4 or 2 puffs every 6 hours.  Can this Rx be changed so insurance will cover?  VE#[phone]/[id number]

## 2017-10-25 ENCOUNTER — Ambulatory Visit (INDEPENDENT_AMBULATORY_CARE_PROVIDER_SITE_OTHER): Payer: Self-pay | Admitting: Vascular Surgery

## 2017-10-27 ENCOUNTER — Ambulatory Visit
Admission: RE | Admit: 2017-10-27 | Discharge: 2017-10-27 | Disposition: A | Discharge status: Home / Self Care | Payer: Medicare HMO | Source: Ambulatory Visit | Attending: Student | Admitting: Student

## 2017-10-27 DIAGNOSIS — I700 Atherosclerosis of aorta: Secondary | ICD-10-CM | POA: Insufficient documentation

## 2017-10-27 DIAGNOSIS — R1012 Left upper quadrant pain: Secondary | ICD-10-CM | POA: Insufficient documentation

## 2017-10-27 DIAGNOSIS — K760 Fatty (change of) liver, not elsewhere classified: Secondary | ICD-10-CM | POA: Diagnosis not present

## 2017-10-27 DIAGNOSIS — K529 Noninfective gastroenteritis and colitis, unspecified: Secondary | ICD-10-CM | POA: Diagnosis present

## 2017-10-27 DIAGNOSIS — K4090 Unilateral inguinal hernia, without obstruction or gangrene, not specified as recurrent: Secondary | ICD-10-CM | POA: Diagnosis not present

## 2017-10-27 DIAGNOSIS — R933 Abnormal findings on diagnostic imaging of other parts of digestive tract: Secondary | ICD-10-CM

## 2017-10-27 MED ORDER — IOPAMIDOL (ISOVUE-370) INJECTION 76%
100.0000 mL | Freq: Once | INTRAVENOUS | Status: AC | PRN
  Administered 2017-10-27: 100 mL via INTRAVENOUS

## 2017-11-01 ENCOUNTER — Ambulatory Visit (INDEPENDENT_AMBULATORY_CARE_PROVIDER_SITE_OTHER): Payer: Medicare HMO | Admitting: Vascular Surgery

## 2017-11-01 ENCOUNTER — Encounter (INDEPENDENT_AMBULATORY_CARE_PROVIDER_SITE_OTHER): Payer: Self-pay | Admitting: Vascular Surgery

## 2017-11-01 VITALS — BP 136/87 | HR 63 | Resp 17 | Ht 67.0 in | Wt 234.6 lb

## 2017-11-01 DIAGNOSIS — K219 Gastro-esophageal reflux disease without esophagitis: Secondary | ICD-10-CM | POA: Diagnosis not present

## 2017-11-01 DIAGNOSIS — I10 Essential (primary) hypertension: Secondary | ICD-10-CM

## 2017-11-01 DIAGNOSIS — E1159 Type 2 diabetes mellitus with other circulatory complications: Secondary | ICD-10-CM | POA: Diagnosis not present

## 2017-11-01 DIAGNOSIS — G8929 Other chronic pain: Secondary | ICD-10-CM

## 2017-11-01 DIAGNOSIS — R1013 Epigastric pain: Secondary | ICD-10-CM

## 2017-11-01 DIAGNOSIS — I25118 Atherosclerotic heart disease of native coronary artery with other forms of angina pectoris: Secondary | ICD-10-CM | POA: Diagnosis not present

## 2017-11-01 DIAGNOSIS — I480 Paroxysmal atrial fibrillation: Secondary | ICD-10-CM | POA: Diagnosis not present

## 2017-11-04 ENCOUNTER — Other Ambulatory Visit: Payer: Self-pay | Admitting: Family Medicine

## 2017-11-04 NOTE — Telephone Encounter (Signed)
Pt contacted office for refill request on the following medications:  oxyCODONE-acetaminophen (PERCOCET) 10-325 MG tablet  [facility]  Please advise. Thanks TNP

## 2017-11-05 MED ORDER — OXYCODONE-ACETAMINOPHEN 10-325 MG PO TABS: 1.0000 | ORAL_TABLET | ORAL | 0 refills | Status: DC | PRN

## 2017-11-06 ENCOUNTER — Encounter (INDEPENDENT_AMBULATORY_CARE_PROVIDER_SITE_OTHER): Payer: Self-pay | Admitting: Vascular Surgery

## 2017-11-06 DIAGNOSIS — G8929 Other chronic pain: Secondary | ICD-10-CM | POA: Insufficient documentation

## 2017-11-06 DIAGNOSIS — R1013 Epigastric pain: Secondary | ICD-10-CM

## 2017-11-06 NOTE — Progress Notes (Signed)
MRN : [id number]  Lawrence [name] is a 61 y.o. (10-23-1956) male who presents with chief complaint of  Chief Complaint  Patient presents with  . New Patient (Initial Visit)    abdominal pain  .  History of Present Illness:I am asked to evaluate the patient for the complaint of abdominal pain with uncertain etiology.  The patient is followed by GI who is concerned about mesenteric ischemia.  The patient has noted some weight loss as well as nausea.  The patient does substantiate food fear, particular foods do not seem to aggravate or alleviate the symptoms.  The patient denies bloody bowel movements or diarrhea.  The patient has a history of colonoscopy which was not diagnostic.  No history of peptic ulcer disease.   No prior peripheral angiograms or vascular interventions.  The patient denies amaurosis fugax or recent TIA symptoms. There are no recent neurological changes noted. The patient denies claudication symptoms or rest pain symptoms. The patient denies history of DVT, PE or superficial thrombophlebitis. The patient denies recent episodes of angina      Current Meds  Medication Sig  . albuterol (PROVENTIL HFA;VENTOLIN HFA) 108 (90 Base) MCG/ACT inhaler Inhale 2 puffs into the lungs every 4 (four) hours as needed for wheezing or shortness of breath. Reported on 04/08/2016  . allopurinol (ZYLOPRIM) 100 MG tablet TAKE 3 TABLETS BY MOUTH EVERY DAY.  Marland Kitchen ALPRAZolam (XANAX) 1 MG tablet TAKE ONE-HALF TO ONE TABLET BY MOUTH THREE TIMES DAILY AS NEEDED FOR ANXIETY  . aspirin 81 MG chewable tablet Chew 81 mg by mouth daily.  Marland Kitchen atorvastatin (LIPITOR) 80 MG tablet TAKE ONE TABLET BY MOUTH EVERY NIGHT AT BEDTIME  . cetirizine (ZYRTEC) 10 MG tablet TAKE ONE TABLET BY MOUTH AT BEDTIME. (Patient taking differently: 1-2 tid prn)  . cyclobenzaprine (FLEXERIL) 5 MG tablet Take 1-2 tablets (5-10 mg total) by mouth 3 (three) times daily as needed (back pain).  Marland Kitchen docusate sodium (COLACE) 100 MG  capsule Take 100 mg by mouth daily.   Marland Kitchen ELIQUIS 5 MG TABS tablet TAKE 1 TABLET BY MOUTH TWICE A DAY.  . fluticasone furoate-vilanterol (BREO ELLIPTA) 100-25 MCG/INH AEPB Inhale 1 puff into the lungs daily.   . isosorbide mononitrate (IMDUR) 60 MG 24 hr tablet Take 1 tablet (60 mg total) by mouth daily. (Patient taking differently: Take 60 mg by mouth 2 (two) times daily. )  . JARDIANCE 10 MG TABS tablet TAKE 1 TABLET BY MOUTH EVERY DAY  . lisinopril (PRINIVIL,ZESTRIL) 2.5 MG tablet TAKE 1 TABLET BY MOUTH EVERY DAY  . Magnesium Oxide 400 (240 Mg) MG TABS TAKE ONE TABLET BY MOUTH DAILY  . metFORMIN (GLUCOPHAGE) 500 MG tablet TAKE 1 TABLET BY MOUTH EVERY MORNING.  . nitroGLYCERIN (NITROSTAT) 0.4 MG SL tablet Place 1 tablet (0.4 mg total) under the tongue every 5 (five) minutes as needed for chest pain. Reported on 05/26/2016  . omega-3 acid ethyl esters (LOVAZA) 1 g capsule TAKE FOUR CAPSULES BY MOUTH DAILY  . pantoprazole (PROTONIX) 40 MG tablet Take 40 mg by mouth daily.  . potassium chloride SA (K-DUR,KLOR-CON) 20 MEQ tablet   . RANEXA 500 MG 12 hr tablet Take 1,000 mg by mouth 2 (two) times daily.  . sotalol (BETAPACE) 120 MG tablet Take 120 mg by mouth 2 (two) times daily.   Marland Kitchen SPIRIVA HANDIHALER 18 MCG inhalation capsule INHALE 1 CAPSULE VIA INHALER ONCE A DAY  . sucralfate (CARAFATE) 1 g tablet Take by mouth.  Marland Kitchen  tamsulosin (FLOMAX) 0.4 MG CAPS capsule Take by mouth.  . [DISCONTINUED] oxyCODONE-acetaminophen (PERCOCET) 10-325 MG tablet Take 1 tablet by mouth every 4 (four) hours as needed.    Past Medical History:  Diagnosis Date  . A-fib ([place])   . Anemia   . Anxiety   . Asthma   . CAD (coronary artery disease)    a. 2002 CABGx2 (LIMA->LAD, VG->VG->OM1);  b. 09/2012 DES->OM;  c. 03/2015 PTCA of LAD [facility]) in setting of atretic LIMA; d. 05/2015 Cath [facility]): nonobs dzs; e. 06/2015 Cath ([facility]): LM nl, LAD 45p/d ISR, 50d, D1/2 small, LCX 50p/d ISR, OM1 70ost, 30 ISR, VG->OM1 50ost, 61m LIMA->LAD  99p/d - atretic, RCA dom, nl; f.cath 10/16: 40-50%(FFR 0.90) pLAD, 75% (FFR 0.77) mLAD s/p PCI/DES, oRCA 40% (FFR0.95)  . Celiac disease   . Chronic diastolic CHF (congestive heart failure) ([place]    a. 06/2009 Echo: EF 60-65%, Gr 1 DD, triv AI, mildly dil LA, nl RV.  .Marland KitchenCOPD (chronic obstructive pulmonary disease) ([place]    a. Chronic bronchitis and emphysema.  .Marland KitchenDysrhythmia   . Essential hypertension   . History of tobacco abuse    a. Quit 2014.  .Marland KitchenPSVT (paroxysmal supraventricular tachycardia) ([place]    a. 10/2012 Noted on Zio Patch.  . Type II diabetes mellitus ([place]     Past Surgical History:  Procedure Laterality Date  . BYPASS GRAFT    . CARDIAC CATHETERIZATION N/A 07/12/2015   rocedure: Left Heart Cath and Cors/Grafts Angiography;  Surgeon: HBelva Crome MD;  [facility];  Service: Cardiovascular;  Laterality: N/A;  . CARDIAC CATHETERIZATION Right 10/07/2015   Procedure: Left Heart Cath and Cors/Grafts Angiography;  Surgeon: SDionisio David MD;  [facility];  Service: Cardiovascular;  Laterality: Right;  . CARDIAC CATHETERIZATION N/A 04/06/2016   Procedure: Left Heart Cath and Coronary Angiography;  Surgeon: DYolonda Kida MD;  [facility];  Service: Cardiovascular;  Laterality: N/A;  . CARDIAC CATHETERIZATION  04/06/2016   Procedure: Bypass Graft Angiography;  Surgeon: DYolonda Kida MD;  [facility];  Service: Cardiovascular;;  . CARDIAC CATHETERIZATION N/A 11/02/2016   Procedure: Left Heart Cath and Cors/Grafts Angiography and possible PCI;  Surgeon: DYolonda Kida MD;  [facility];  Service: Cardiovascular;  Laterality: N/A;  . CARDIAC CATHETERIZATION N/A 11/02/2016   Procedure: Coronary Stent Intervention;  Surgeon: DYolonda Kida MD;  [facility];  Service: Cardiovascular;  Laterality: N/A;  . CHOLECYSTECTOMY    . ESOPHAGEAL DILATION    . TONSILLECTOMY     . VASCULAR SURGERY      Social History Social History   Tobacco Use  . Smoking status: Former Smoker    Last attempt to quit: 04/22/2013    Years since quitting: 4.5  . Smokeless tobacco: Never Used  Substance Use Topics  . Alcohol use: No    Comment: remotely quit alcohol use. Hx of heavy alcohol use.  . Drug use: No    Family History Family History  Problem Relation Age of Onset  . Heart attack Mother   . Depression Mother   . Heart disease Mother   . COPD Mother   . Hypertension Mother   . Heart attack Father   . Diabetes Father   . Depression Father   . Heart disease Father   . Cirrhosis Father   . Parkinson's disease Brother   No family history  of bleeding/clotting disorders, porphyria or autoimmune disease   Allergies  Allergen Reactions  . Demerol [Meperidine] Hives  . Prednisone Other (See Comments) and Hypertension    Pt states that this medication puts him in A-fib   . Sulfa Antibiotics Hives  . Albuterol Sulfate [Albuterol] Palpitations and Other (See Comments)    Pt currently uses this medication.    . Morphine Sulfate Nausea And Vomiting, Rash and Other (See Comments)    Pt states that he is only allergic to the tablet form of this medication.       REVIEW OF SYSTEMS (Negative unless checked)  Constitutional: [] Weight loss  [] Fever  [] Chills Cardiac: [] Chest pain   [] Chest pressure   [] Palpitations   [] Shortness of breath when laying flat   [] Shortness of breath with exertion. Vascular:  [] Pain in legs with walking   [] Pain in legs at rest  [] History of DVT   [] Phlebitis   [] Swelling in legs   [] Varicose veins   [] Non-healing ulcers Pulmonary:   [] Uses home oxygen   [] Productive cough   [] Hemoptysis   [] Wheeze  [] COPD   [] Asthma Neurologic:  [] Dizziness   [] Seizures   [] History of stroke   [] History of TIA  [] Aphasia   [] Vissual changes   [] Weakness or numbness in arm   [] Weakness or numbness in leg Musculoskeletal:   [] Joint swelling   [] Joint  pain   [] Low back pain Hematologic:  [] Easy bruising  [] Easy bleeding   [] Hypercoagulable state   [] Anemic Gastrointestinal:  [] Diarrhea   [] Vomiting  [] Gastroesophageal reflux/heartburn   [] Difficulty swallowing. Genitourinary:  [] Chronic kidney disease   [] Difficult urination  [] Frequent urination   [] Blood in urine Skin:  [] Rashes   [] Ulcers  Psychological:  [] History of anxiety   []  History of major depression.  Physical Examination  Vitals:   11/01/17 1516  BP: 136/87  Pulse: 63  Resp: 17  Weight: 106.4 kg (234 lb 9.6 oz)  Height: 5' 7"  (1.702 m)   Body mass index is 36.74 kg/m. Gen: WD/WN, NAD Head: [place]/AT, No temporalis wasting.  Ear/Nose/Throat: Hearing grossly intact, nares w/o erythema or drainage, poor dentition Eyes: PER, EOMI, sclera nonicteric.  Neck: Supple, no masses.  No bruit or JVD.  Pulmonary:  Good air movement, clear to auscultation bilaterally, no use of accessory muscles.  Cardiac: RRR, normal S1, S2, no Murmurs. Vascular:  Vessel Right Left  Radial Palpable Palpable  Ulnar Palpable Palpable  Brachial Palpable Palpable  Carotid Palpable Palpable  Femoral Palpable Palpable  Popliteal Palpable Palpable  PT Palpable Palpable  DP Palpable Palpable   Gastrointestinal: soft, non-distended. No guarding/no peritoneal signs.  Musculoskeletal: M/S 5/5 throughout.  No deformity or atrophy.  Neurologic: CN 2-12 intact. Pain and light touch intact in extremities.  Symmetrical.  Speech is fluent. Motor exam as listed above. Psychiatric: Judgment intact, Mood & affect appropriate for pt's clinical situation. Dermatologic: No rashes or ulcers noted.  No changes consistent with cellulitis. Lymph : No Cervical lymphadenopathy, no lichenification or skin changes of chronic lymphedema.  CBC Lab Results  Component Value Date   WBC 8.1 10/13/2017   HGB 16.0 10/13/2017   HCT 49.8 10/13/2017   MCV 89.3 10/13/2017   PLT 167 10/13/2017    BMET    Component Value  Date/Time   NA 135 10/13/2017 1957   NA 143 10/27/2016 1435   NA 143 03/07/2015 1851   K 4.3 10/13/2017 1957   K 3.9 03/07/2015 1851   CL 99 (L) 10/13/2017  1957   CL 107 03/07/2015 1851   CO2 28 10/13/2017 1957   CO2 29 03/07/2015 1851   GLUCOSE 104 (H) 10/13/2017 1957   GLUCOSE 99 03/07/2015 1851   BUN 13 10/13/2017 1957   BUN 10 10/27/2016 1435   BUN 9 03/07/2015 1851   CREATININE 1.20 10/13/2017 1957   CREATININE 0.84 03/07/2015 1851   CALCIUM 9.1 10/13/2017 1957   CALCIUM 9.6 03/07/2015 1851   GFRNONAA >60 10/13/2017 1957   GFRNONAA >60 03/07/2015 1851   GFRAA >60 10/13/2017 1957   GFRAA >60 03/07/2015 1851   CrCl cannot be calculated (Patient's most recent lab result is older than the maximum 21 days allowed.).  COAG Lab Results  Component Value Date   INR 1.20 06/04/2017   INR 1.10 10/29/2016   INR 1.45 04/03/2016    Radiology Ct Entero Abd/pelvis W Contast  Result Date: 10/27/2017 CLINICAL DATA:  Chronic left upper and lower quadrant pain and nausea, worsening over past 6 months. Personal history of achalasia status post myotomy and pancreatitis. EXAM: CT ABDOMEN AND PELVIS WITH CONTRAST (ENTEROGRAPHY) TECHNIQUE: Multidetector CT of the abdomen and pelvis during bolus administration of intravenous contrast. Negative oral contrast was given. CONTRAST:  14m ISOVUE-370 IOPAMIDOL (ISOVUE-370) INJECTION 76% COMPARISON:  05/03/2017 FINDINGS: Lower Chest: No acute findings. Hepatobiliary: Mild diffuse hepatic steatosis again noted. No liver lesions identified. Prior cholecystectomy. No evidence of biliary obstruction. Pancreas:  No mass or inflammatory changes. Spleen: Within normal limits in size and appearance. Adrenals/Urinary Tract: No masses identified. No evidence of hydronephrosis. Stomach/Bowel: Stable mild wall thickening of visualized distal esophagus, which may be due to chronic esophagitis and/or known history of achalasia. No evidence of small bowel wall  thickening or dilatation. No evidence of abnormal mucosal enhancement or mesenteric inflammatory changes. No evidence of enteric fistula, extraluminal gas or abnormal fluid collections. The terminal ileum is normal in appearance. No other areas of bowel wall thickening identified. Normal appendix visualized. Vascular/Lymphatic: No pathologically enlarged lymph nodes. No abdominal aortic aneurysm. Aortic atherosclerosis. Reproductive:  No mass or other significant abnormality. Other:  Stable small left inguinal hernia containing only fat. Musculoskeletal:  No suspicious bone lesions identified. IMPRESSION: Stable mild distal esophageal wall thickening, which may be due to chronic esophagitis and/or known history of achalasia. No evidence of enterocolonic pathology, or other acute findings. Stable mild hepatic steatosis. Stable small left inguinal hernia containing only fat. Aortic atherosclerosis. Electronically Signed   By: JEarle GellM.D.   On: 10/27/2017 10:37    Assessment/Plan 1. Abdominal pain, chronic, epigastric Recommend:  Patient should undergo duplex of the mesenteric arteries with the hope for intervention to eliminate the ischemic symptoms.    The risks and benefits as well as the alternative therapies was discussed in detail with the patient.  All questions were answered.  Patient agrees to proceed with angiography and intervention.  The patient will follow up with me after the ultrasound  2. Coronary artery disease involving native coronary artery of native heart with other form of angina pectoris ([place] Continue cardiac and antihypertensive medications as already ordered and reviewed, no changes at this time.  Continue statin as ordered and reviewed, no changes at this time  Nitrates PRN for chest pain   3. Essential hypertension Continue antihypertensive medications as already ordered, these medications have been reviewed and there are no changes at this time.   4. Paroxysmal  atrial fibrillation (HCC) Continue antiarrhythmia medications as already ordered, these medications have been reviewed and there are  no changes at this time.  Continue anticoagulation as ordered by Cardiology Service   5. Gastroesophageal reflux disease without esophagitis Continue PPI as already ordered, these medications have been reviewed and there are no changes at this time.   6. Type 2 diabetes mellitus with other circulatory complication, without long-term current use of insulin (HCC) Continue hypoglycemic medications as already ordered, these medications have been reviewed and there are no changes at this time.  Hgb A1C to be monitored as already arranged by primary service     Hortencia Pilar, MD  11/06/2017 3:04 PM

## 2017-11-08 ENCOUNTER — Other Ambulatory Visit: Payer: Self-pay

## 2017-11-08 ENCOUNTER — Emergency Department
Admission: EM | Admit: 2017-11-08 | Discharge: 2017-11-08 | Disposition: A | Discharge status: Home / Self Care | Payer: Medicare HMO | Attending: Emergency Medicine | Admitting: Emergency Medicine

## 2017-11-08 ENCOUNTER — Encounter: Payer: Self-pay | Admitting: Emergency Medicine

## 2017-11-08 ENCOUNTER — Emergency Department: Payer: Medicare HMO

## 2017-11-08 DIAGNOSIS — Z7982 Long term (current) use of aspirin: Secondary | ICD-10-CM | POA: Insufficient documentation

## 2017-11-08 DIAGNOSIS — Z7984 Long term (current) use of oral hypoglycemic drugs: Secondary | ICD-10-CM | POA: Insufficient documentation

## 2017-11-08 DIAGNOSIS — R1011 Right upper quadrant pain: Secondary | ICD-10-CM | POA: Diagnosis not present

## 2017-11-08 DIAGNOSIS — N189 Chronic kidney disease, unspecified: Secondary | ICD-10-CM | POA: Diagnosis not present

## 2017-11-08 DIAGNOSIS — I2510 Atherosclerotic heart disease of native coronary artery without angina pectoris: Secondary | ICD-10-CM | POA: Insufficient documentation

## 2017-11-08 DIAGNOSIS — R109 Unspecified abdominal pain: Secondary | ICD-10-CM | POA: Diagnosis not present

## 2017-11-08 DIAGNOSIS — E119 Type 2 diabetes mellitus without complications: Secondary | ICD-10-CM | POA: Diagnosis not present

## 2017-11-08 DIAGNOSIS — R1012 Left upper quadrant pain: Secondary | ICD-10-CM | POA: Insufficient documentation

## 2017-11-08 DIAGNOSIS — G8929 Other chronic pain: Secondary | ICD-10-CM | POA: Diagnosis not present

## 2017-11-08 DIAGNOSIS — Z87891 Personal history of nicotine dependence: Secondary | ICD-10-CM | POA: Insufficient documentation

## 2017-11-08 DIAGNOSIS — J449 Chronic obstructive pulmonary disease, unspecified: Secondary | ICD-10-CM | POA: Insufficient documentation

## 2017-11-08 DIAGNOSIS — R079 Chest pain, unspecified: Secondary | ICD-10-CM | POA: Diagnosis not present

## 2017-11-08 DIAGNOSIS — I5032 Chronic diastolic (congestive) heart failure: Secondary | ICD-10-CM | POA: Insufficient documentation

## 2017-11-08 DIAGNOSIS — J45909 Unspecified asthma, uncomplicated: Secondary | ICD-10-CM | POA: Insufficient documentation

## 2017-11-08 DIAGNOSIS — I130 Hypertensive heart and chronic kidney disease with heart failure and stage 1 through stage 4 chronic kidney disease, or unspecified chronic kidney disease: Secondary | ICD-10-CM | POA: Insufficient documentation

## 2017-11-08 DIAGNOSIS — R1013 Epigastric pain: Secondary | ICD-10-CM | POA: Diagnosis not present

## 2017-11-08 LAB — BASIC METABOLIC PANEL
Anion gap: 4 — ABNORMAL LOW (ref 5–15)
BUN: 16 mg/dL (ref 6–20)
CO2: 29 mmol/L (ref 22–32)
Calcium: 9.4 mg/dL (ref 8.9–10.3)
Chloride: 105 mmol/L (ref 101–111)
Creatinine, Ser: 1.21 mg/dL (ref 0.61–1.24)
GFR calc Af Amer: >60 mL/min (ref >60)
GFR calc non Af Amer: >60 mL/min (ref >60)
Glucose, Bld: 132 mg/dL — ABNORMAL HIGH (ref 65–99)
Potassium: 4.5 mmol/L (ref 3.5–5.1)
Sodium: 138 mmol/L (ref 135–145)

## 2017-11-08 LAB — CBC
HCT: 50.8 % (ref 40.0–52.0)
Hemoglobin: 16.5 g/dL (ref 13.0–18.0)
MCH: 28.9 pg (ref 26.0–34.0)
MCHC: 32.5 g/dL (ref 32.0–36.0)
MCV: 88.9 fL (ref 80.0–100.0)
Platelets: 187 10*3/uL (ref 150–440)
RBC: 5.72 MIL/uL (ref 4.40–5.90)
RDW: 16.9 % — ABNORMAL HIGH (ref 11.5–14.5)
WBC: 7.4 10*3/uL (ref 3.8–10.6)

## 2017-11-08 LAB — HEPATIC FUNCTION PANEL
ALT: 17 U/L (ref 17–63)
AST: 18 U/L (ref 15–41)
Albumin: 3.7 g/dL (ref 3.5–5.0)
Alkaline Phosphatase: 75 U/L (ref 38–126)
Bilirubin, Direct: 0.1 mg/dL (ref 0.1–0.5)
Indirect Bilirubin: 0.6 mg/dL (ref 0.3–0.9)
Total Bilirubin: 0.7 mg/dL (ref 0.3–1.2)
Total Protein: 6.8 g/dL (ref 6.5–8.1)

## 2017-11-08 LAB — TROPONIN I
Troponin I: <0.03 ng/mL (ref <0.03)
Troponin I: <0.03 ng/mL (ref <0.03)

## 2017-11-08 LAB — LACTIC ACID, PLASMA: Lactic Acid, Venous: 1.1 mmol/L (ref 0.5–1.9)

## 2017-11-08 LAB — LIPASE, BLOOD: Lipase: 25 U/L (ref 11–51)

## 2017-11-08 MED ORDER — ONDANSETRON HCL 4 MG PO TABS
4.0000 mg | ORAL_TABLET | Freq: Once | ORAL | Status: AC
  Administered 2017-11-08: 4 mg via ORAL
  Filled 2017-11-08: qty 1

## 2017-11-08 MED ORDER — ONDANSETRON HCL 4 MG PO TABS: 4.0000 mg | ORAL_TABLET | Freq: Three times a day (TID) | ORAL | 0 refills | Status: DC | PRN

## 2017-11-08 NOTE — ED Triage Notes (Signed)
Pt reports left side chest pain radiating to left arm for two days. Pt reports LUQ abdominal pain for over one year. Pt reports the pain is in his pancreas. Pt reports associated SOB, nausea and dizziness.

## 2017-11-08 NOTE — ED Triage Notes (Signed)
First Nurse Note:  C/O abdominal pain and chest pain x 2 days.  AAOx3.  Skin warm and dry.  NAD

## 2017-11-08 NOTE — ED Notes (Signed)
Patient discharge and follow up information reviewed with patient by ED nursing staff and patient given the opportunity to ask questions pertaining to ED visit and discharge plan of care. Patient advised that should symptoms not continue to improve, resolve entirely, or should new symptoms develop then a follow up visit with their PCP or a return visit to the ED may be warranted. Patient verbalized consent and understanding of discharge plan of care including potential need for further evaluation. Patient being discharged in stable condition per attending ED physician on duty.   

## 2017-11-08 NOTE — ED Provider Notes (Addendum)
[facility] Emergency Department Provider Note  ____________________________________________   I have reviewed the triage vital signs and the nursing notes. Where available I have reviewed prior notes and, if possible and indicated, outside hospital notes.    HISTORY  Chief Complaint Chest Pain and Abdominal Pain    HPI Lawrence [name] is a 61 y.o. male has chronic abdominal pain chronic narcotic use, chronic antianxiety medication use, history of CAD anxiety anemia, celiac disease, chronic abdominal discomfort being followed by GI and vascular.  Patient has had several negative CT scans for his chronic abdominal pain this year.  Last time he was here I did recommend that he follow-up with vascular for the possibility of low flow state.  He has not lost weight.  He is being worked up by vascular for this and they will do an outpatient study for it.  However, the pain persists.  Patient has had this pain every day for 8 months.  He is concerned that it might be his pancreas.  To me he denies chest pain he states that she is has normal abdominal pain but the pain sometimes takes his breath away.  He denies any shortness of breath.  He denies any fever chills or change in his chronic pain he states he is not run out of his chronic narcotics having according to the notes in the [facility], patient did pull 150 Percocet tens 4 days ago.  He has had no vomiting, no change in his stooling.  When I last saw him I did advise against repeat CT scan as we have never seen anything on a CT scan to justify this pain of any significance but he did follow-up as again as an outpatient and his GI doctor performed another CT scan which was again reassuring there is some degree of esophageal issues but no perforation etc.  Patient is here because he wants to "answers".  He has chronic epigastric right upper quadrant left upper quadrant abdominal pain.     Past Medical History:  Diagnosis Date  .  A-fib ([place])   . Anemia   . Anxiety   . Asthma   . CAD (coronary artery disease)    a. 2002 CABGx2 (LIMA->LAD, VG->VG->OM1);  b. 09/2012 DES->OM;  c. 03/2015 PTCA of LAD [facility]) in setting of atretic LIMA; d. 05/2015 Cath [facility]): nonobs dzs; e. 06/2015 Cath ([facility]): LM nl, LAD 45p/d ISR, 50d, D1/2 small, LCX 50p/d ISR, OM1 70ost, 30 ISR, VG->OM1 50ost, 3m LIMA->LAD 99p/d - atretic, RCA dom, nl; f.cath 10/16: 40-50%(FFR 0.90) pLAD, 75% (FFR 0.77) mLAD s/p PCI/DES, oRCA 40% (FFR0.95)  . Celiac disease   . Chronic diastolic CHF (congestive heart failure) ([place]    a. 06/2009 Echo: EF 60-65%, Gr 1 DD, triv AI, mildly dil LA, nl RV.  .Marland KitchenCOPD (chronic obstructive pulmonary disease) ([place]    a. Chronic bronchitis and emphysema.  .Marland KitchenDysrhythmia   . Essential hypertension   . History of tobacco abuse    a. Quit 2014.  .Marland KitchenPSVT (paroxysmal supraventricular tachycardia) ([place]    a. 10/2012 Noted on Zio Patch.  . Type II diabetes mellitus ([facility]     Patient Active Problem List   Diagnosis Date Noted  . Abdominal pain, chronic, epigastric 11/06/2017  . Bilateral flank pain 03/24/2017  . Dyspnea 04/03/2016  . Hypotension 04/03/2016  . Chronic kidney disease 04/03/2016  . Anemia 04/03/2016  . Paroxysmal atrial fibrillation ([place] 12/23/2015  . OSA (obstructive sleep apnea) 12/10/2015  .  Left inguinal hernia 11/07/2015  . Anxiety 11/07/2015  . Unstable angina ([place]) 10/05/2015  . Back pain with left-sided radiculopathy 09/30/2015  . Nocturnal hypoxia 09/06/2015  . BPH (benign prostatic hyperplasia) 08/01/2015  . Chronic diastolic CHF (congestive heart failure) ([place])   . Angina pectoris ([place])   . Chest pain 07/11/2015  . COPD (chronic obstructive pulmonary disease) ([place]) 07/03/2015  . CAD (coronary artery disease) 06/26/2015  . HTN (hypertension) 06/26/2015  . DM (diabetes mellitus) ([place]) 06/26/2015  . Achalasia 07/24/2014  . GERD (gastroesophageal reflux disease) 06/07/2014  . Former tobacco use  04/11/2013  . HLD (hyperlipidemia) 04/09/2013    Past Surgical History:  Procedure Laterality Date  . BYPASS GRAFT    . CARDIAC CATHETERIZATION N/A 07/12/2015   rocedure: Left Heart Cath and Cors/Grafts Angiography;  Surgeon: Belva Crome, MD;  Location: [place] CV LAB;  Service: Cardiovascular;  Laterality: N/A;  . CARDIAC CATHETERIZATION Right 10/07/2015   Procedure: Left Heart Cath and Cors/Grafts Angiography;  Surgeon: Dionisio David, MD;  Location: [place] [facility];  Service: Cardiovascular;  Laterality: Right;  . CARDIAC CATHETERIZATION N/A 04/06/2016   Procedure: Left Heart Cath and Coronary Angiography;  Surgeon: Yolonda Kida, MD;  Location: [place] [facility];  Service: Cardiovascular;  Laterality: N/A;  . CARDIAC CATHETERIZATION  04/06/2016   Procedure: Bypass Graft Angiography;  Surgeon: Yolonda Kida, MD;  Location: [place] [facility];  Service: Cardiovascular;;  . CARDIAC CATHETERIZATION N/A 11/02/2016   Procedure: Left Heart Cath and Cors/Grafts Angiography and possible PCI;  Surgeon: Yolonda Kida, MD;  Location: [place] [facility];  Service: Cardiovascular;  Laterality: N/A;  . CARDIAC CATHETERIZATION N/A 11/02/2016   Procedure: Coronary Stent Intervention;  Surgeon: Yolonda Kida, MD;  Location: [place] [facility];  Service: Cardiovascular;  Laterality: N/A;  . CHOLECYSTECTOMY    . ESOPHAGEAL DILATION    . TONSILLECTOMY    . VASCULAR SURGERY      Prior to Admission medications   Medication Sig Start Date End Date Taking? Authorizing Provider  albuterol (PROVENTIL HFA;VENTOLIN HFA) 108 (90 Base) MCG/ACT inhaler Inhale 2 puffs into the lungs every 4 (four) hours as needed for wheezing or shortness of breath. Reported on 04/08/2016 10/21/17  Yes Birdie Sons, MD  allopurinol (ZYLOPRIM) 100 MG tablet TAKE 3 TABLETS BY MOUTH EVERY DAY. 09/11/17   Birdie Sons, MD  ALPRAZolam Duanne Moron) 1 MG tablet TAKE ONE-HALF TO ONE TABLET BY MOUTH THREE  TIMES DAILY AS NEEDED FOR ANXIETY 09/28/17   Birdie Sons, MD  aspirin 81 MG chewable tablet Chew 81 mg by mouth daily.    [provider]  atorvastatin (LIPITOR) 80 MG tablet TAKE ONE TABLET BY MOUTH EVERY NIGHT AT BEDTIME 08/13/17   Birdie Sons, MD  BANOPHEN 25 MG capsule TAKE 1 CAPSULE (25MG TOTAL) BY MOUTH EVERY 4 (FOUR) HOURS AS NEEDED. Patient not taking: Reported on 11/01/2017 07/14/17   Birdie Sons, MD  cetirizine (ZYRTEC) 10 MG tablet TAKE ONE TABLET BY MOUTH AT BEDTIME. Patient taking differently: 1-2 tid prn 05/12/17   Birdie Sons, MD  cyclobenzaprine (FLEXERIL) 5 MG tablet Take 1-2 tablets (5-10 mg total) by mouth 3 (three) times daily as needed (back pain). 05/28/17   Birdie Sons, MD  docusate sodium (COLACE) 100 MG capsule Take 100 mg by mouth daily.     [provider]  ELIQUIS 5 MG TABS tablet TAKE 1 TABLET BY MOUTH TWICE A DAY.  07/28/17   Birdie Sons, MD  famotidine (PEPCID) 40 MG tablet Take 1 tablet (40 mg total) by mouth every evening. Patient not taking: Reported on 11/01/2017 05/12/17 05/12/18  Birdie Sons, MD  fluticasone furoate-vilanterol (BREO ELLIPTA) 100-25 MCG/INH AEPB Inhale 1 puff into the lungs daily.     [provider]  glucose blood (ACCU-CHEK AVIVA PLUS) test strip Use to check blood sugar once a day Patient not taking: Reported on 11/01/2017 11/26/16   Birdie Sons, MD  hydrocortisone 1 % ointment Apply 1 application topically 2 (two) times daily. Apply to rash Patient not taking: Reported on 11/01/2017 06/19/17   [name], MD  isosorbide mononitrate (IMDUR) 60 MG 24 hr tablet Take 1 tablet (60 mg total) by mouth daily. Patient taking differently: Take 60 mg by mouth 2 (two) times daily.  11/04/16   Henreitta Leber, MD  JARDIANCE 10 MG TABS tablet TAKE 1 TABLET BY MOUTH EVERY DAY 07/14/17   Birdie Sons, MD  lansoprazole (PREVACID) 30 MG capsule Take 30 mg by mouth 2 (two) times daily.      [provider]  lisinopril (PRINIVIL,ZESTRIL) 2.5 MG tablet TAKE 1 TABLET BY MOUTH EVERY DAY 08/13/17   Darylene Price A, FNP  Magnesium Oxide 400 (240 Mg) MG TABS TAKE ONE TABLET BY MOUTH DAILY 03/23/17   Birdie Sons, MD  Magnesium Oxide 400 (240 Mg) MG TABS TAKE 1 TABLET BY MOUTH EVERY DAY Patient not taking: Reported on 11/01/2017 09/11/17   Alisa Graff, FNP  metFORMIN (GLUCOPHAGE) 500 MG tablet TAKE 1 TABLET BY MOUTH EVERY MORNING. 10/05/16   Birdie Sons, MD  nitroGLYCERIN (NITROSTAT) 0.4 MG SL tablet Place 1 tablet (0.4 mg total) under the tongue every 5 (five) minutes as needed for chest pain. Reported on 05/26/2016 10/06/16   Birdie Sons, MD  omega-3 acid ethyl esters (LOVAZA) 1 g capsule TAKE FOUR CAPSULES BY MOUTH DAILY 06/04/17   Birdie Sons, MD  oxyCODONE-acetaminophen (PERCOCET) 10-325 MG tablet Take 1 tablet by mouth every 4 (four) hours as needed. 11/05/17   Birdie Sons, MD  pantoprazole (PROTONIX) 40 MG tablet Take 40 mg by mouth daily.    [provider]  potassium chloride SA (K-DUR,KLOR-CON) 20 MEQ tablet  08/13/17   [provider]  RANEXA 500 MG 12 hr tablet Take 1,000 mg by mouth 2 (two) times daily.    [provider]  sotalol (BETAPACE) 120 MG tablet Take 120 mg by mouth 2 (two) times daily.     [provider]  SPIRIVA HANDIHALER 18 MCG inhalation capsule INHALE 1 CAPSULE VIA INHALER ONCE A DAY 01/25/17   Birdie Sons, MD  sucralfate (CARAFATE) 1 g tablet Take 1 tablet (1 g total) by mouth 2 (two) times daily. Patient not taking: Reported on 11/01/2017 05/03/17   [name], MD  sucralfate (CARAFATE) 1 g tablet Take by mouth. 10/29/17 10/29/18  [provider]  tamsulosin (FLOMAX) 0.4 MG CAPS capsule Take by mouth.    [provider]  torsemide (DEMADEX) 100 MG tablet Take 0.5 tablets by mouth daily. 10/11/17   [provider]    Allergies Demerol [meperidine];  Prednisone; Sulfa antibiotics; Albuterol sulfate [albuterol]; and Morphine sulfate  Family History  Problem Relation Age of Onset  . Heart attack Mother   . Depression Mother   . Heart disease Mother   . COPD Mother   . Hypertension Mother   .  Heart attack Father   . Diabetes Father   . Depression Father   . Heart disease Father   . Cirrhosis Father   . Parkinson's disease Brother     Social History Social History   Tobacco Use  . Smoking status: Former Smoker    Last attempt to quit: 04/22/2013    Years since quitting: 4.5  . Smokeless tobacco: Never Used  Substance Use Topics  . Alcohol use: No    Comment: remotely quit alcohol use. Hx of heavy alcohol use.  . Drug use: No    Review of Systems Constitutional: No fever/chills Eyes: No visual changes. ENT: No sore throat. No stiff neck no neck pain Cardiovascular: Denies chest pain. Respiratory: Denies shortness of breath. Gastrointestinal:   no vomiting.  No diarrhea.  No constipation. Genitourinary: Negative for dysuria. Musculoskeletal: Negative lower extremity swelling Skin: Negative for rash. Neurological: Negative for severe headaches, focal weakness or numbness.   ____________________________________________   PHYSICAL EXAM:  VITAL SIGNS: ED Triage Vitals [11/08/17 1636]  Enc Vitals Group     BP (!) 159/85     Pulse Rate 70     Resp 18     Temp 98.4 F (36.9 C)     Temp Source Oral     SpO2 97 %     Weight 234 lb (106.1 kg)     Height 5' 7"  (1.702 m)     Head Circumference      Peak Flow      Pain Score 9     Pain Loc      Pain Edu?      Excl. in [place]?     Constitutional: Alert and oriented. Well appearing and in no acute distress. Eyes: Conjunctivae are normal Head: Atraumatic HEENT: No congestion/rhinnorhea. Mucous membranes are moist.  Oropharynx non-erythematous Neck:   Nontender with no meningismus, no masses, no stridor Cardiovascular: Normal rate, regular rhythm. Grossly normal heart  sounds.  Good peripheral circulation. Respiratory: Normal respiratory effort.  No retractions. Lungs CTAB. Abdominal: Soft and minimally tender . No distention. No guarding no rebound Back:  There is no focal tenderness or step off.  there is no midline tenderness there are no lesions noted. there is no CVA tenderness Musculoskeletal: No lower extremity tenderness, no upper extremity tenderness. No joint effusions, no DVT signs strong distal pulses no edema Neurologic:  Normal speech and language. No gross focal neurologic deficits are appreciated.  Skin:  Skin is warm, dry and intact. No rash noted. Psychiatric: Mood and affect are normal. Speech and behavior are normal.  ____________________________________________   LABS (all labs ordered are listed, but only abnormal results are displayed)  Labs Reviewed  BASIC METABOLIC PANEL - Abnormal; Notable for the following components:      Result Value   Glucose, Bld 132 (*)    Anion gap 4 (*)    All other components within normal limits  CBC - Abnormal; Notable for the following components:   RDW 16.9 (*)    All other components within normal limits  TROPONIN I  LIPASE, BLOOD  HEPATIC FUNCTION PANEL  LACTIC ACID, PLASMA  TROPONIN I  LACTIC ACID, PLASMA    Pertinent labs  results that were available during my care of the patient were reviewed by me and considered in my medical decision making (see chart for details). ____________________________________________  EKG  I personally interpreted any EKGs ordered by me or triage Sinus rhythm rate 69 bpm no acute ST elevation or  depression normal axis unremarkable EKG ____________________________________________  RADIOLOGY  Pertinent labs & imaging results that were available during my care of the patient were reviewed by me and considered in my medical decision making (see chart for details). If possible, patient and/or family made aware of any abnormal findings.  Dg Chest 2  View  Result Date: 11/08/2017 CLINICAL DATA:  Pt reports left side chest pain radiating to left arm for two days. Pt reports LUQ abdominal pain for over one year. Pt reports the pain is in his pancreas. Pt reports associated SOB, nausea and dizzinessHx CABG, cardiac cath x 3, MI, COPD, a-fib. Former smoker quit in 2014 EXAM: CHEST  2 VIEW COMPARISON:  09/30/2017 FINDINGS: Cardiac silhouette is normal size and configuration. Changes from cardiac surgery, and left coronary stent, are stable. Normal mediastinal and hilar contours. There prominent bronchovascular markings. Lungs otherwise clear with no evidence of pneumonia or pulmonary edema. No pleural effusion or pneumothorax. Skeletal structures are demineralized but grossly intact. IMPRESSION: No acute cardiopulmonary disease. Electronically Signed   By: Lajean Manes M.D.   On: 11/08/2017 17:06   ____________________________________________    PROCEDURES  Procedure(s) performed: None  Procedures  Critical Care performed: None  ____________________________________________   INITIAL IMPRESSION / ASSESSMENT AND PLAN / ED COURSE  Pertinent labs & imaging results that were available during my care of the patient were reviewed by me and considered in my medical decision making (see chart for details).  Patient with chronic recurrent significant nonstop abdominal pain for what he describes now is over a year.  He denies radiation to his arm or chest pain to me but we did do 2 sets of cardiac enzymes which are negative.  EKG does not show any acute ischemic changes.  Concern for possible low flow state does exist at this time he did allow me to lactic acid which is negative, blood work is reassuring his exam is the same as it was when I last saw him.  No evidence of pancreatitis liver function abnormalities or other findings, patient has had chronic recurrent CT scans he does not want another CT scan, he states he just gets anxious about his  abdominal pain.  He will follow closely up with all of his outpatient physicians.  He does have adequate pain medication at home he did not want more pain medication for me he took some right before he came in.  I do not see any indication to admit the patient, he is already being worked up by multiple different subspecialties for his chronic abdominal pain which shows no evidence of acute decompensation.  Return precautions and follow-up given and understood,    Considering the patient's symptoms, medical history, and physical examination today, I have low suspicion for cholecystitis or biliary pathology, pancreatitis, perforation or bowel obstruction, hernia, intra-abdominal abscess, AAA or dissection, volvulus or intussusception, mesenteric ischemia, ischemic gut, pyelonephritis or appendicitis.    ____________________________________________   FINAL CLINICAL IMPRESSION(S) / ED DIAGNOSES  Final diagnoses:  None      This chart was dictated using voice recognition software.  Despite best efforts to proofread,  errors can occur which can change meaning.      Schuyler Amor, MD 11/08/17 2211    Schuyler Amor, MD 11/08/17 2212

## 2017-11-08 NOTE — ED Notes (Signed)
Unable to obtain E-Signature for discharge; E-Sign pad in the room is not working; hard copy printed and signed by patient.

## 2017-11-10 DIAGNOSIS — I480 Paroxysmal atrial fibrillation: Secondary | ICD-10-CM | POA: Diagnosis not present

## 2017-11-10 DIAGNOSIS — I25118 Atherosclerotic heart disease of native coronary artery with other forms of angina pectoris: Secondary | ICD-10-CM | POA: Diagnosis not present

## 2017-11-10 DIAGNOSIS — I10 Essential (primary) hypertension: Secondary | ICD-10-CM | POA: Diagnosis not present

## 2017-11-10 DIAGNOSIS — E782 Mixed hyperlipidemia: Secondary | ICD-10-CM | POA: Diagnosis not present

## 2017-11-11 DIAGNOSIS — J449 Chronic obstructive pulmonary disease, unspecified: Secondary | ICD-10-CM | POA: Diagnosis not present

## 2017-11-11 DIAGNOSIS — J439 Emphysema, unspecified: Secondary | ICD-10-CM | POA: Diagnosis not present

## 2017-11-11 DIAGNOSIS — I10 Essential (primary) hypertension: Secondary | ICD-10-CM | POA: Diagnosis not present

## 2017-11-11 DIAGNOSIS — G4710 Hypersomnia, unspecified: Secondary | ICD-10-CM | POA: Diagnosis not present

## 2017-11-13 ENCOUNTER — Other Ambulatory Visit: Payer: Self-pay | Admitting: Family Medicine

## 2017-11-13 DIAGNOSIS — M549 Dorsalgia, unspecified: Secondary | ICD-10-CM

## 2017-11-17 IMAGING — CR DG CHEST 2V
1 series · 2 of 2 positions shown · non-contrast
Comparison: 12/02/2015

CLINICAL DATA: Mid to left-sided chest pain radiating to the back
for 12 hours.

EXAM:
CHEST  2 VIEW

[Series 4: w chest pa · 0.14mm/px · 2 of 2 slices shown]
[im 1/2]
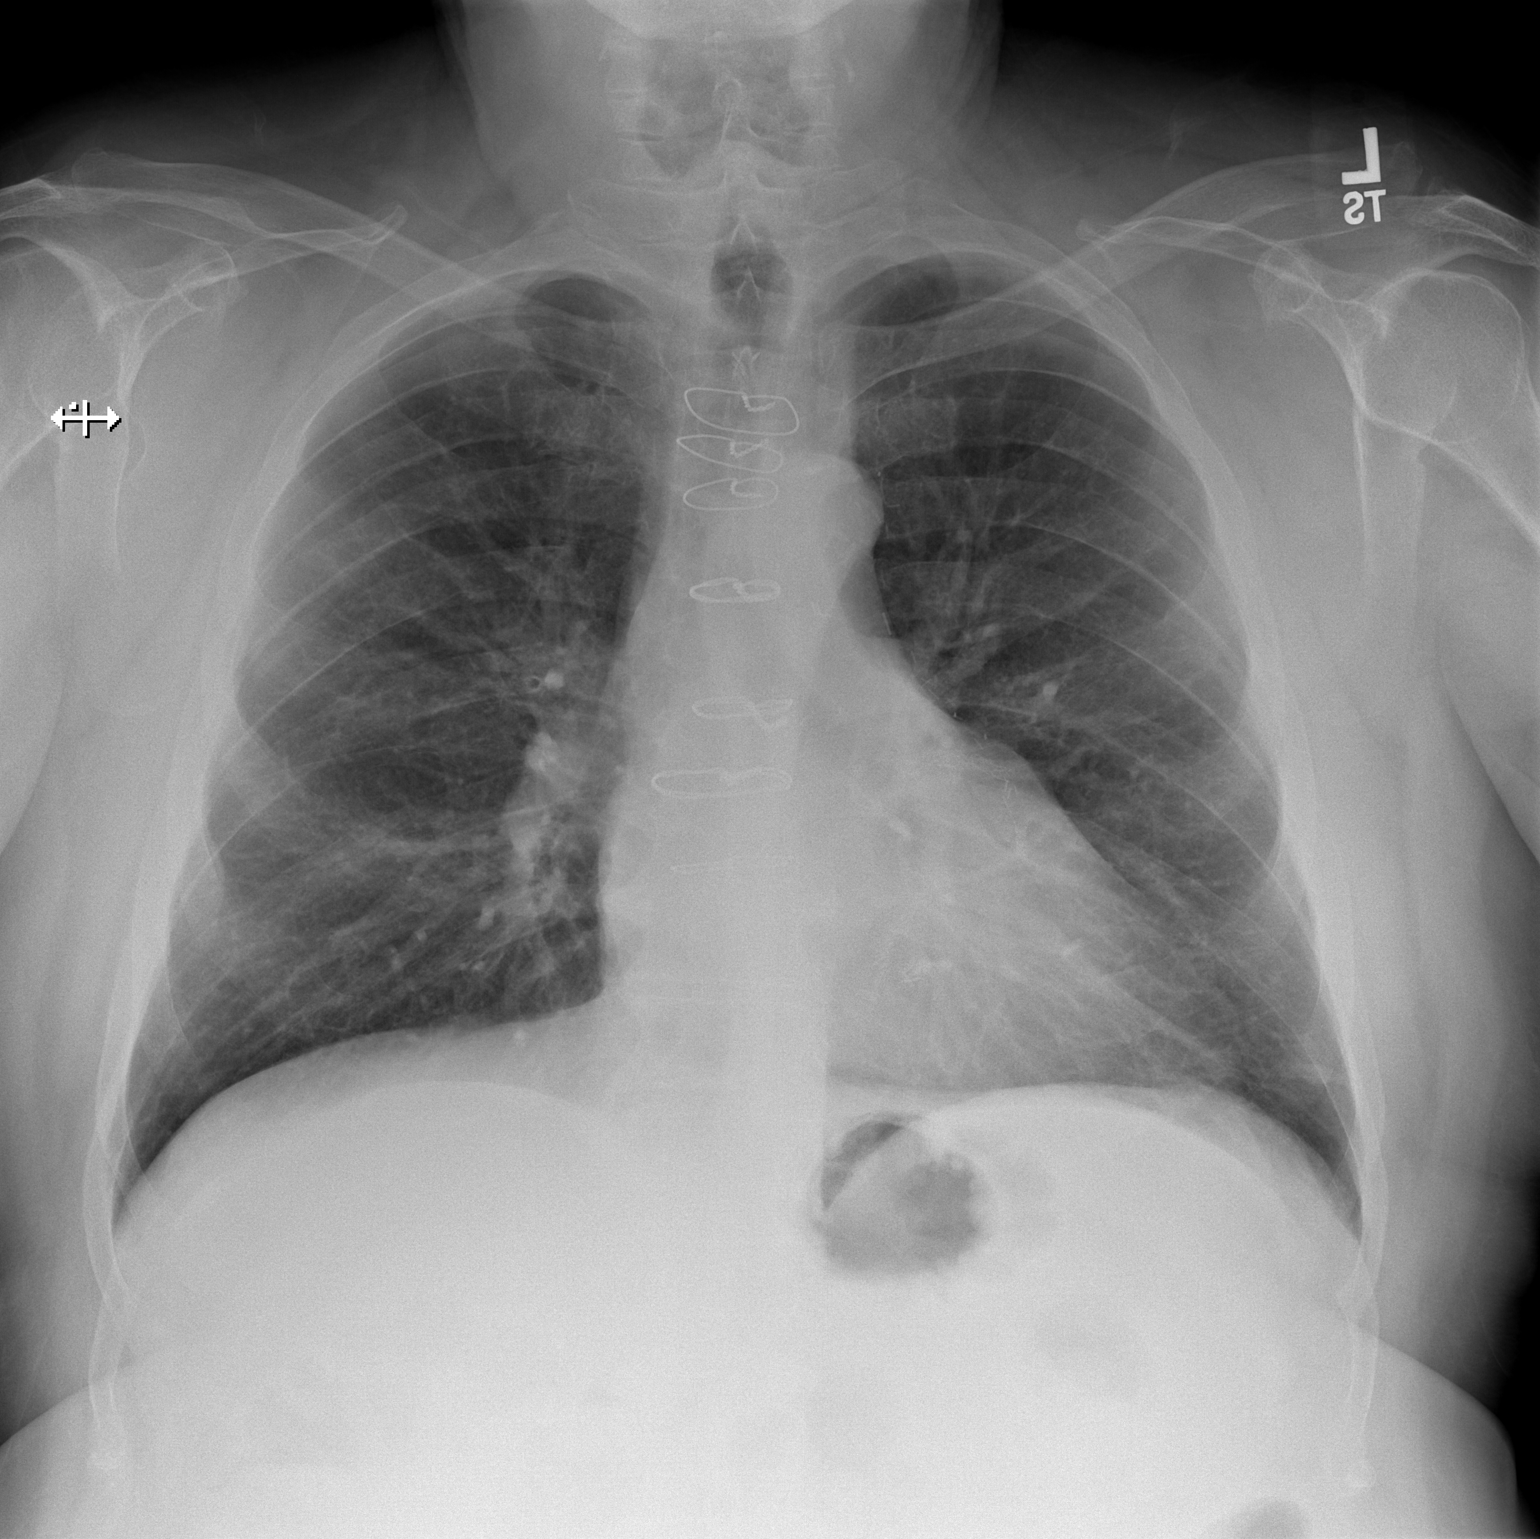
[im 2/2]
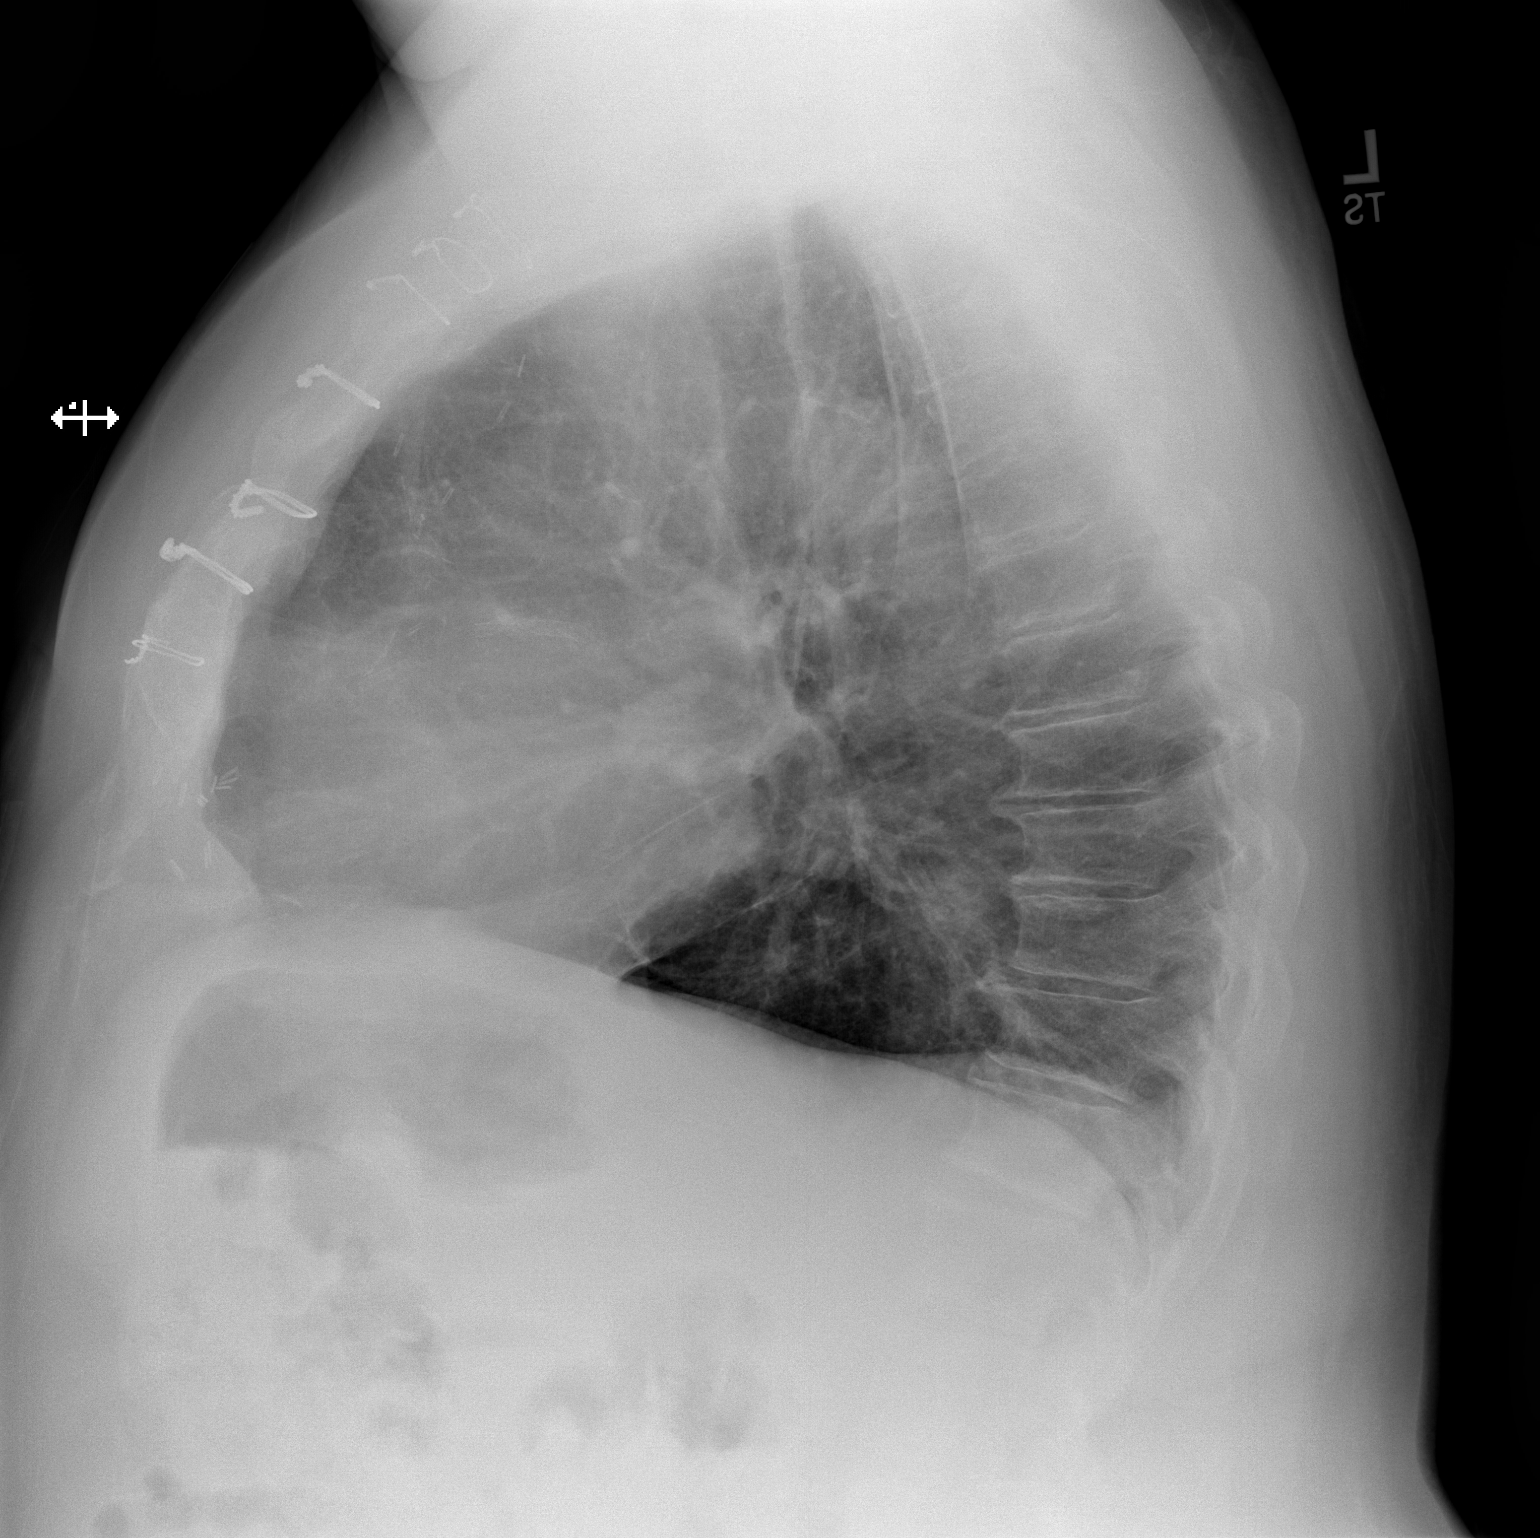

[2 of 2 positions shown; findings below may reference images not displayed]

FINDINGS: The patient is post median sternotomy and CABG. Unchanged
hyperinflation and chronic bronchitic change.The cardiomediastinal
contours are normal. Pulmonary vasculature is normal. No
consolidation, pleural effusion, or pneumothorax. No acute osseous
abnormalities are seen.
IMPRESSION: Chronic hyperinflation and bronchitic change. No superimposed acute
process.

## 2017-11-21 IMAGING — CT CT ABD-PELV W/ CM
1 of 3 series · 14 of 32 positions shown, 19 images · IV contrast (omnipaque)
Comparison: 10/21/2015

CLINICAL DATA: Upper abdominal pain

EXAM:
CT ABDOMEN AND PELVIS WITH CONTRAST
TECHNIQUE: Multidetector CT imaging of the abdomen and pelvis was performed
using the standard protocol following bolus administration of
intravenous contrast.
CONTRAST:  100mL OMNIPAQUE IOHEXOL 300 MG/ML  SOLN

[Series 2: routine abd pel with · axial · 0.84mm/px · z∈[-276,+149]mm · 14 of 97 slices shown, 19 images]
[im 6/97  soft-tissue]
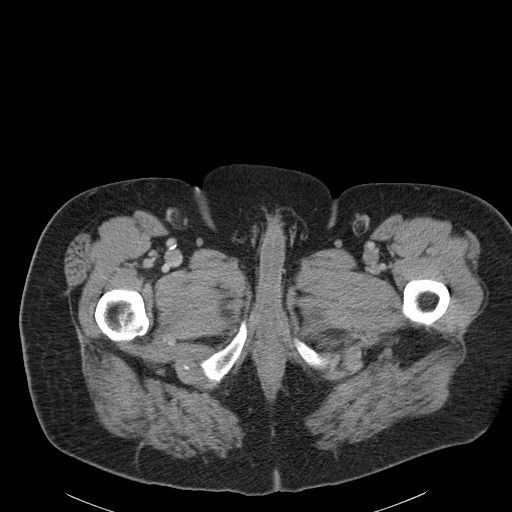
[im 6/97  bone]
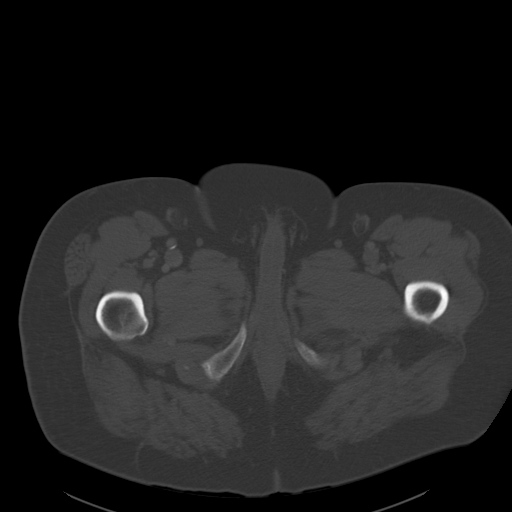
[im 11/97  soft-tissue]
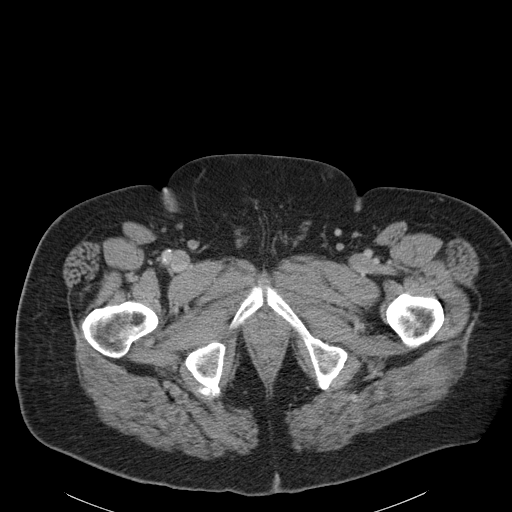
[im 22/97  soft-tissue]
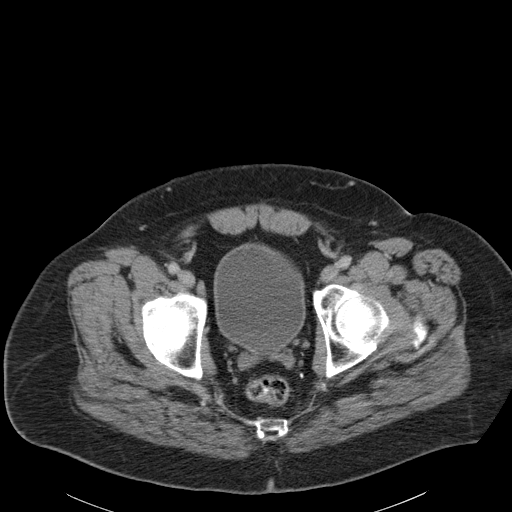
[im 27/97  soft-tissue]
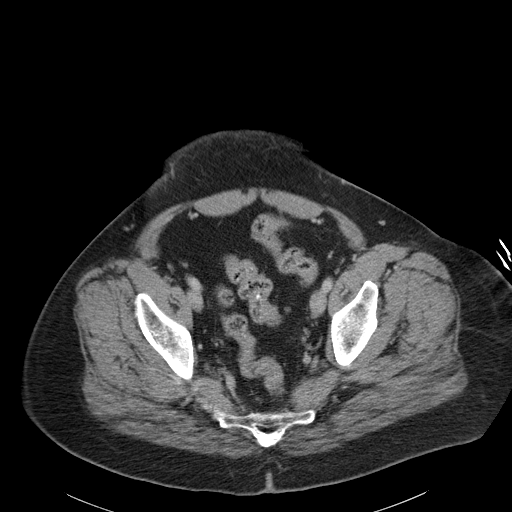
[im 33/97  soft-tissue]
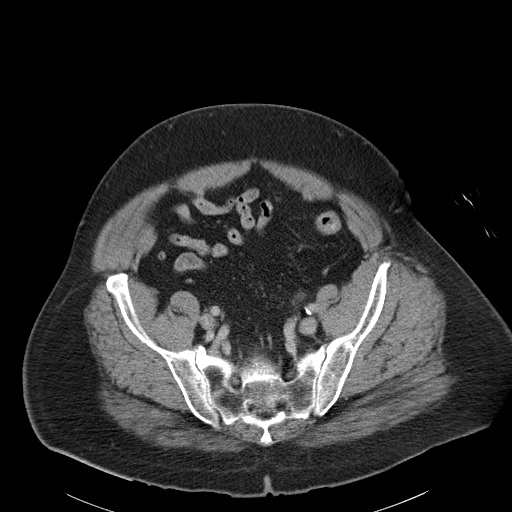
[im 43/97  soft-tissue]
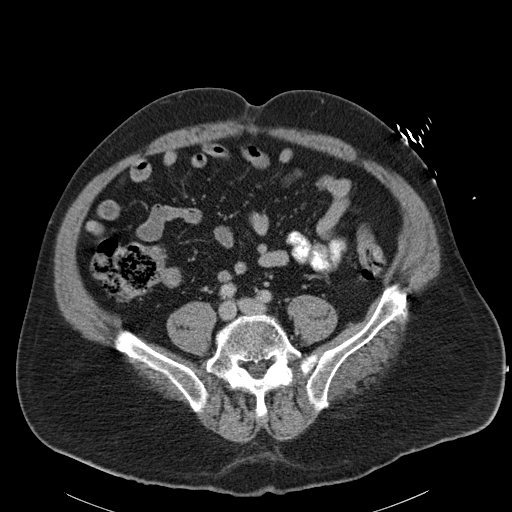
[im 49/97  soft-tissue]
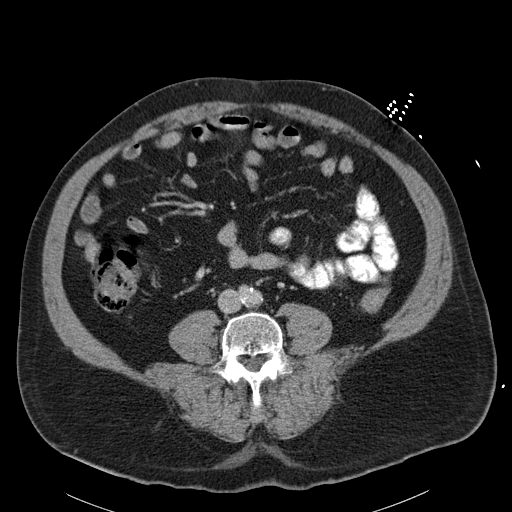
[im 54/97  soft-tissue]
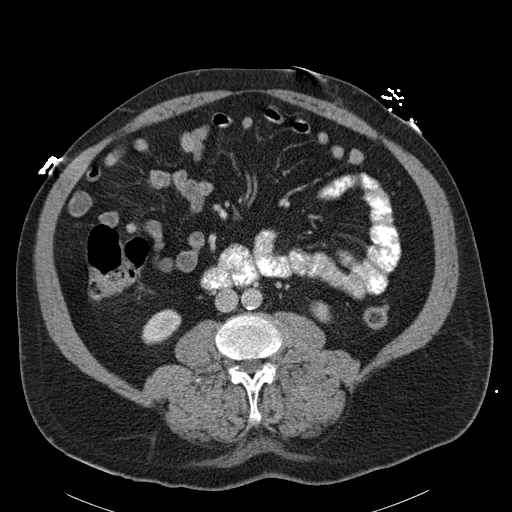
[im 65/97  soft-tissue]
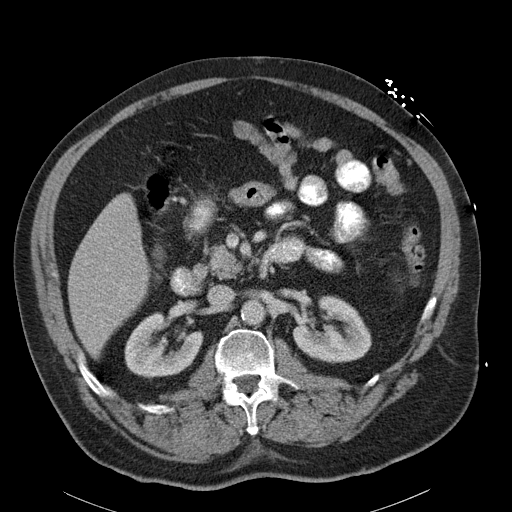
[im 65/97  bone]
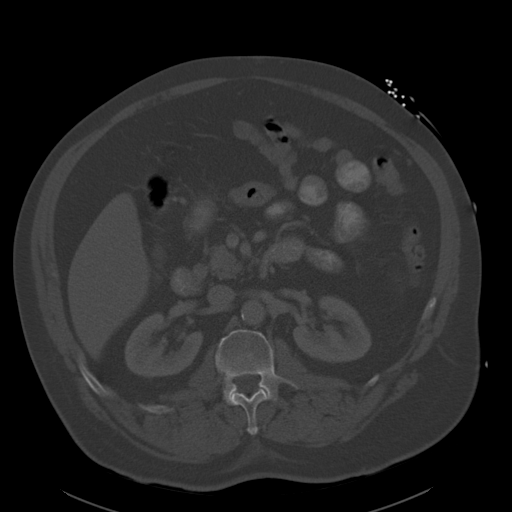
[im 70/97  soft-tissue]
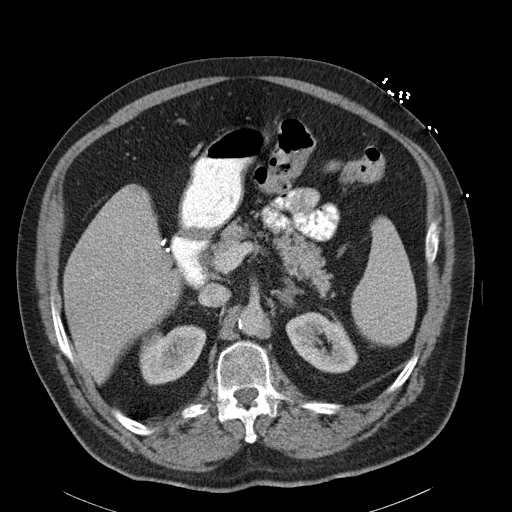
[im 75/97  soft-tissue]
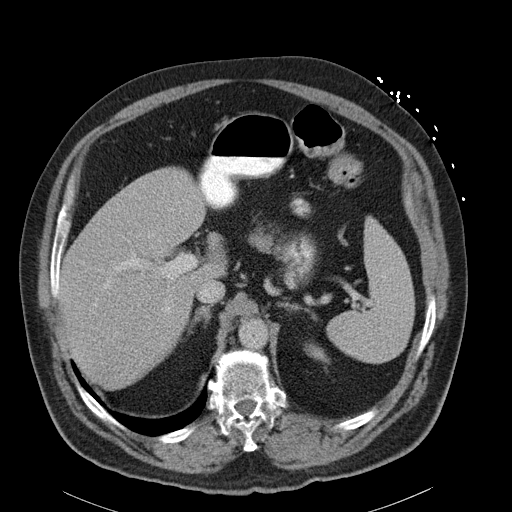
[im 75/97  lung]
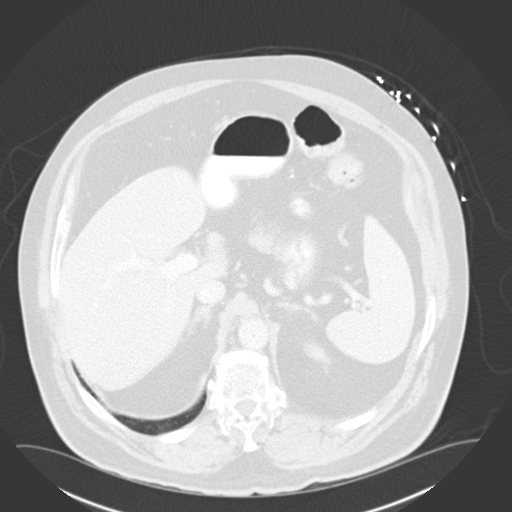
[im 81/97  lung]
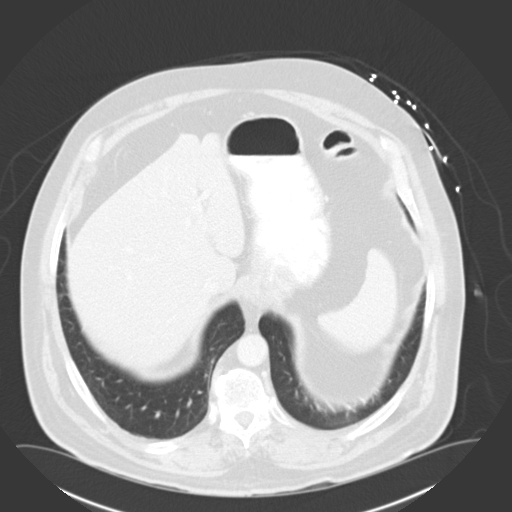
[im 86/97  soft-tissue]
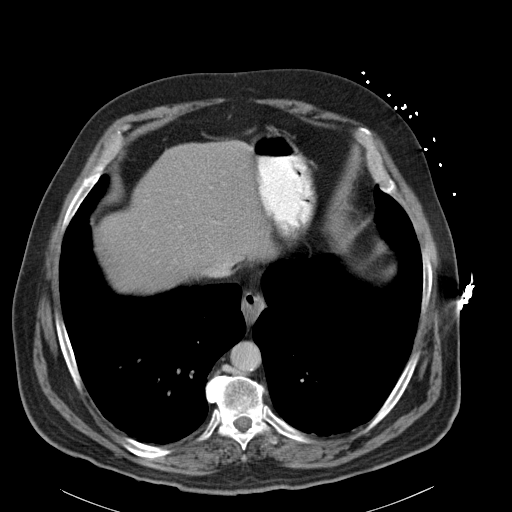
[im 86/97  lung]
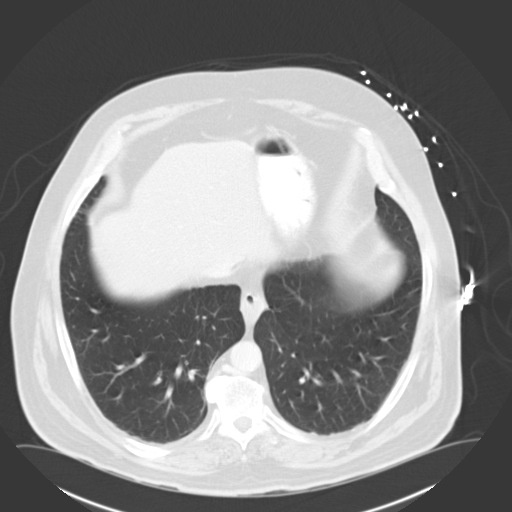
[im 91/97  soft-tissue]
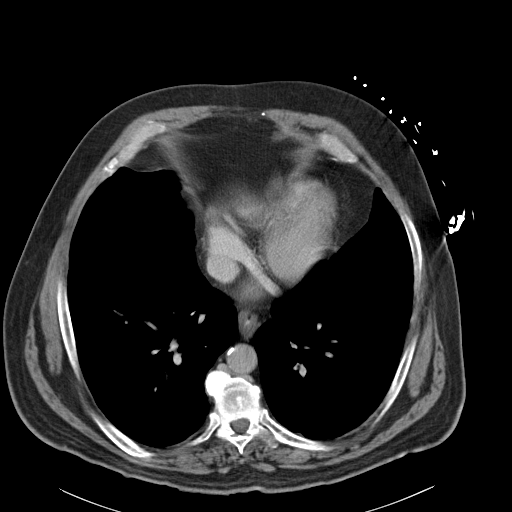
[im 91/97  lung]
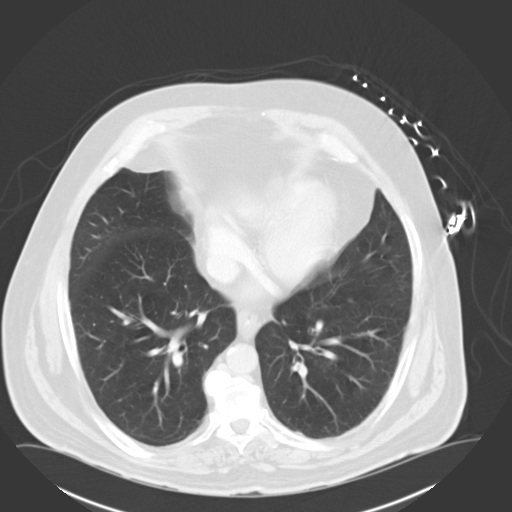

[14 of 32 positions shown; findings below may reference images not displayed]

FINDINGS: Lung bases are free of acute infiltrate or sizable effusion.

The liver is mildly fatty infiltrated. The gallbladder has been
surgically removed. The spleen, adrenal glands and pancreas are
unremarkable.

The kidneys demonstrate a normal enhancement pattern. Normal
excretion of contrast material is seen bilaterally.

The appendix is within normal limits. Scattered diverticular changes
noted within the colon without evidence of diverticulitis. The
bladder is partially distended. No pelvic mass lesion is seen.
Diffuse aortoiliac calcifications are noted. No acute bony
abnormality is noted.
IMPRESSION: Chronic changes without acute abnormality.

## 2017-11-21 IMAGING — CR DG CHEST 2V
1 series · 2 of 2 positions shown · non-contrast
Comparison: January 06, 2016

CLINICAL DATA: Shortness of breath with chest pain

EXAM:
CHEST  2 VIEW

[Series 1: dg chest 2 view · 0.14mm/px · 2 of 2 slices shown]
[im 1/2]
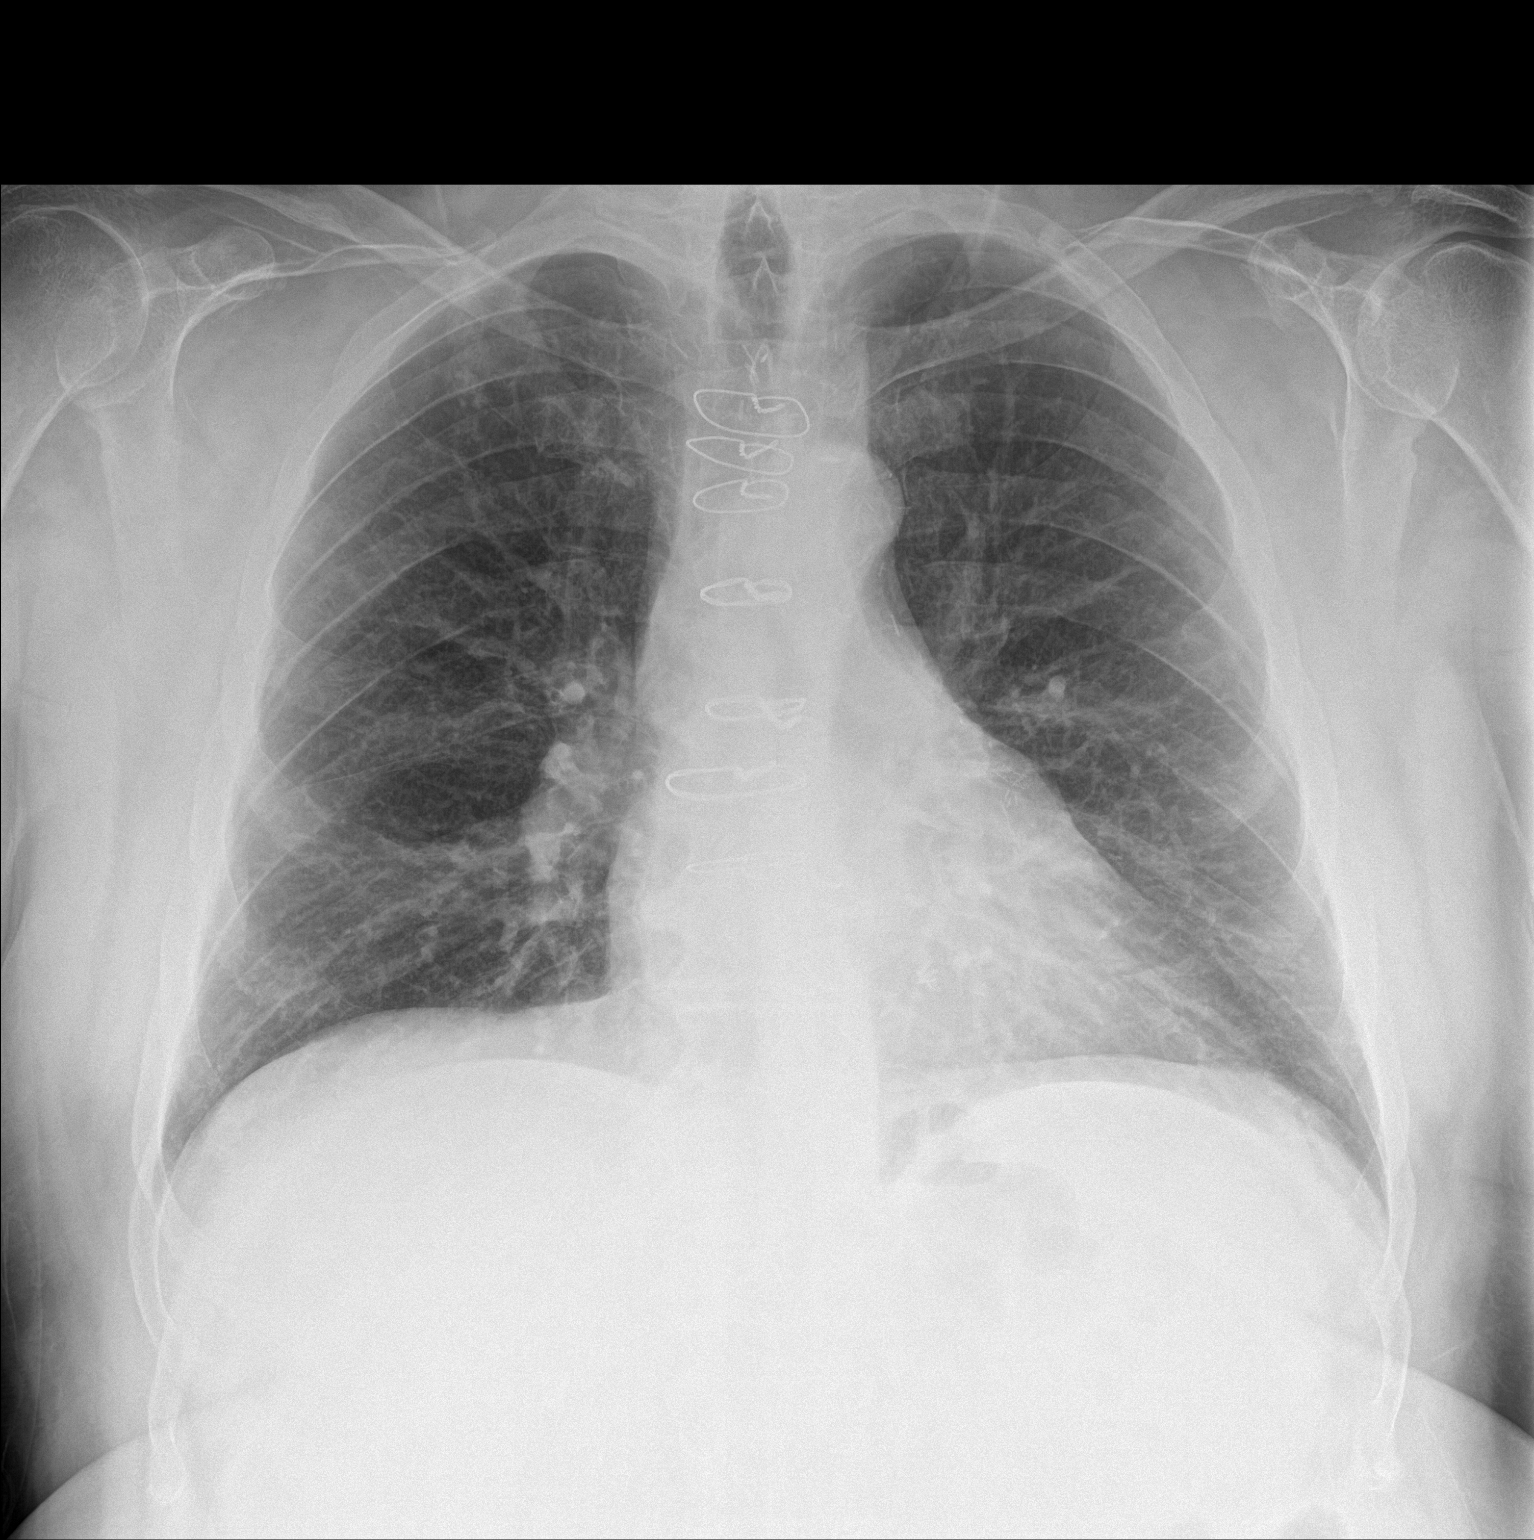
[im 2/2]
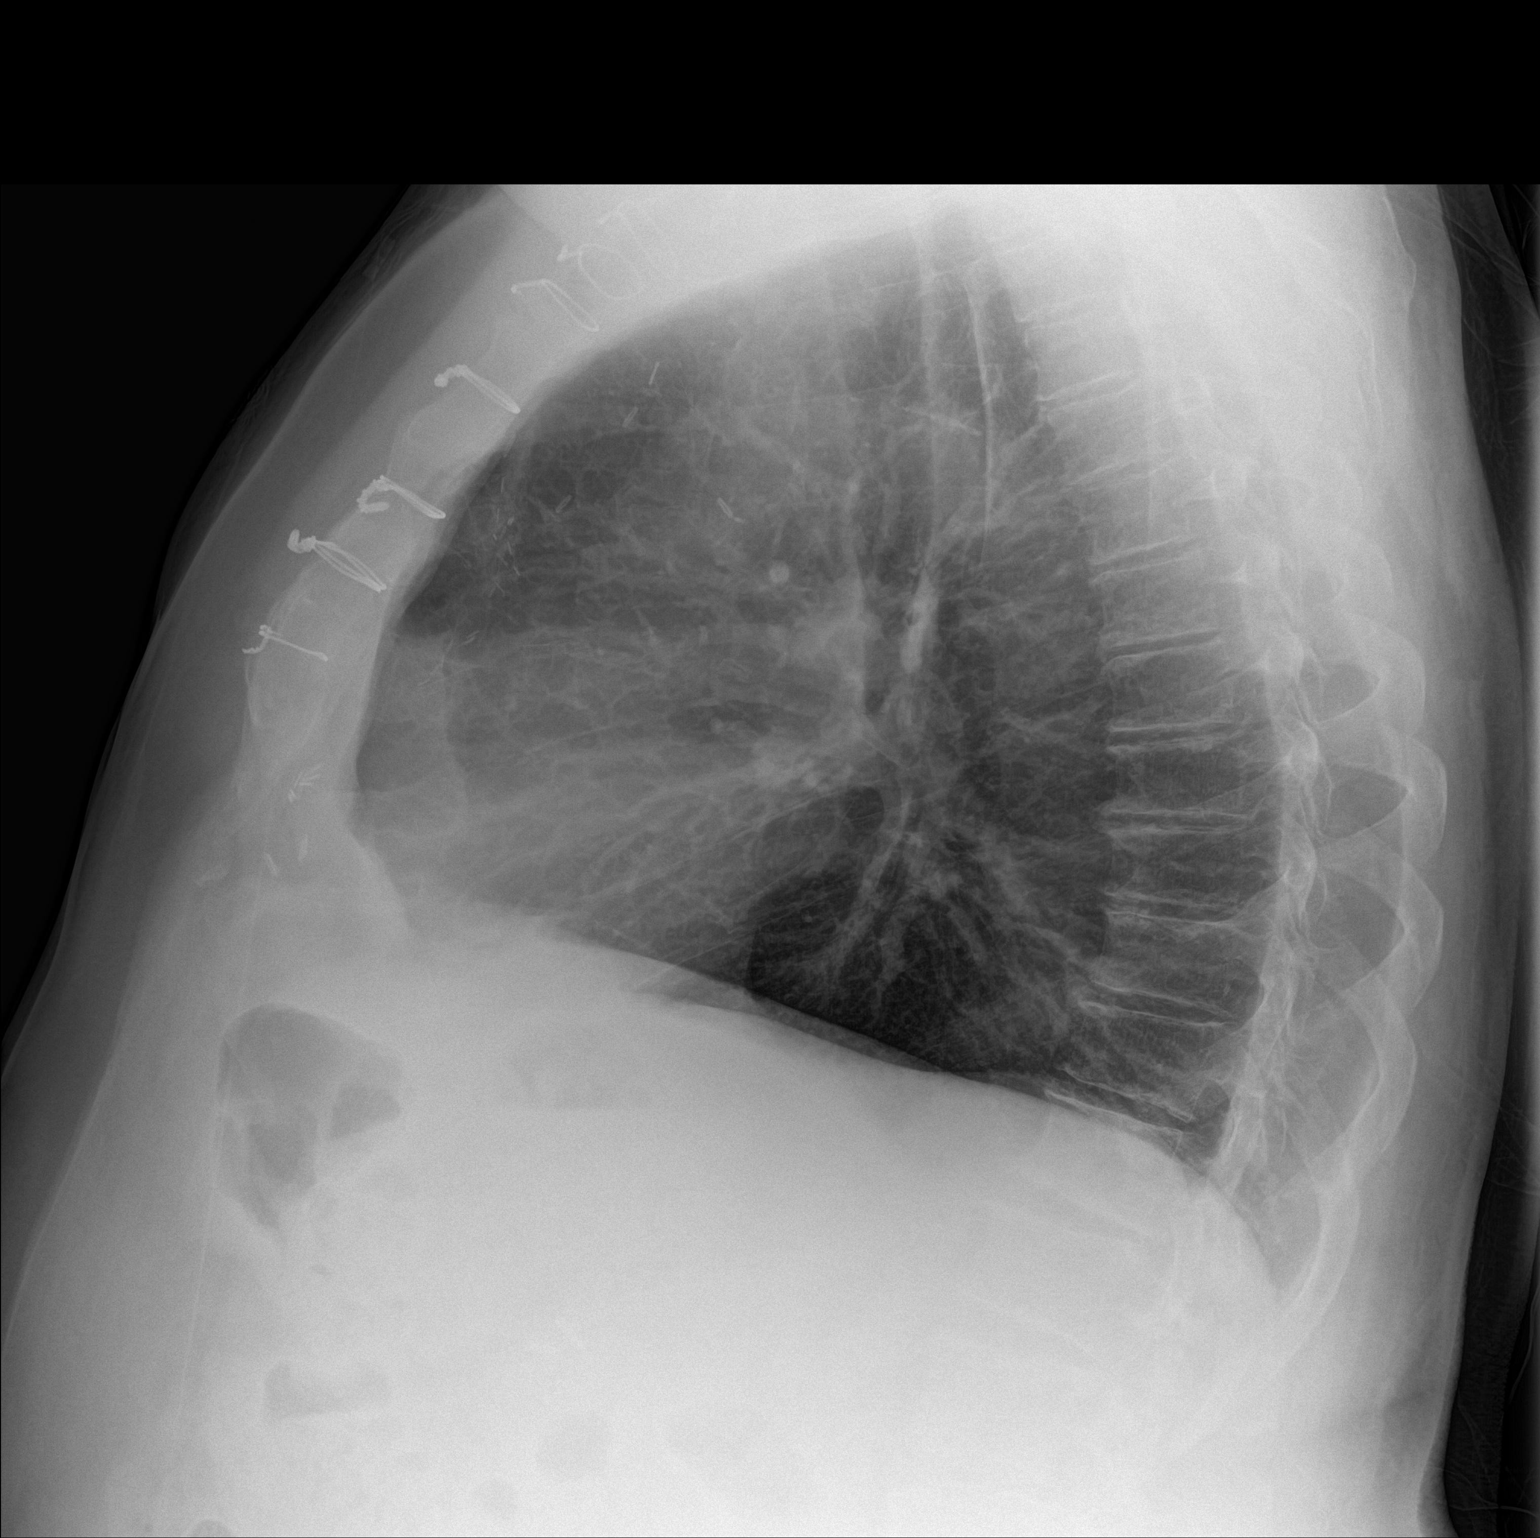

[2 of 2 positions shown; findings below may reference images not displayed]

FINDINGS: There is no edema or consolidation. Heart size and pulmonary
vascularity are normal. No adenopathy. Postoperative change in the
mediastinum is stable. No bone lesions. No pneumothorax.
IMPRESSION: No edema or consolidation.  No change in cardiac silhouette.

## 2017-11-30 ENCOUNTER — Other Ambulatory Visit: Payer: Self-pay | Admitting: Family Medicine

## 2017-12-02 ENCOUNTER — Other Ambulatory Visit: Payer: Self-pay | Admitting: Family Medicine

## 2017-12-02 MED ORDER — OXYCODONE-ACETAMINOPHEN 10-325 MG PO TABS: 1.0000 | ORAL_TABLET | ORAL | 0 refills | Status: DC | PRN

## 2017-12-02 NOTE — Telephone Encounter (Signed)
Pt contacted office for refill request on the following medications:  oxyCODONE-acetaminophen (PERCOCET) 10-325 MG tablet  [facility]  Last Rx: 11/05/17 LOV: 10/12/17  Please advise. Thanks TNP

## 2017-12-02 NOTE — Telephone Encounter (Signed)
Patient advised.

## 2017-12-04 IMAGING — CR DG CHEST 2V
1 series · 2 of 2 positions shown · non-contrast
Comparison: 01/10/2016.

CLINICAL DATA: Wheezing.  Shortness of breath.

EXAM:
CHEST  2 VIEW

[Series 1: dg chest 2 view · 0.14mm/px · 2 of 2 slices shown]
[im 1/2]
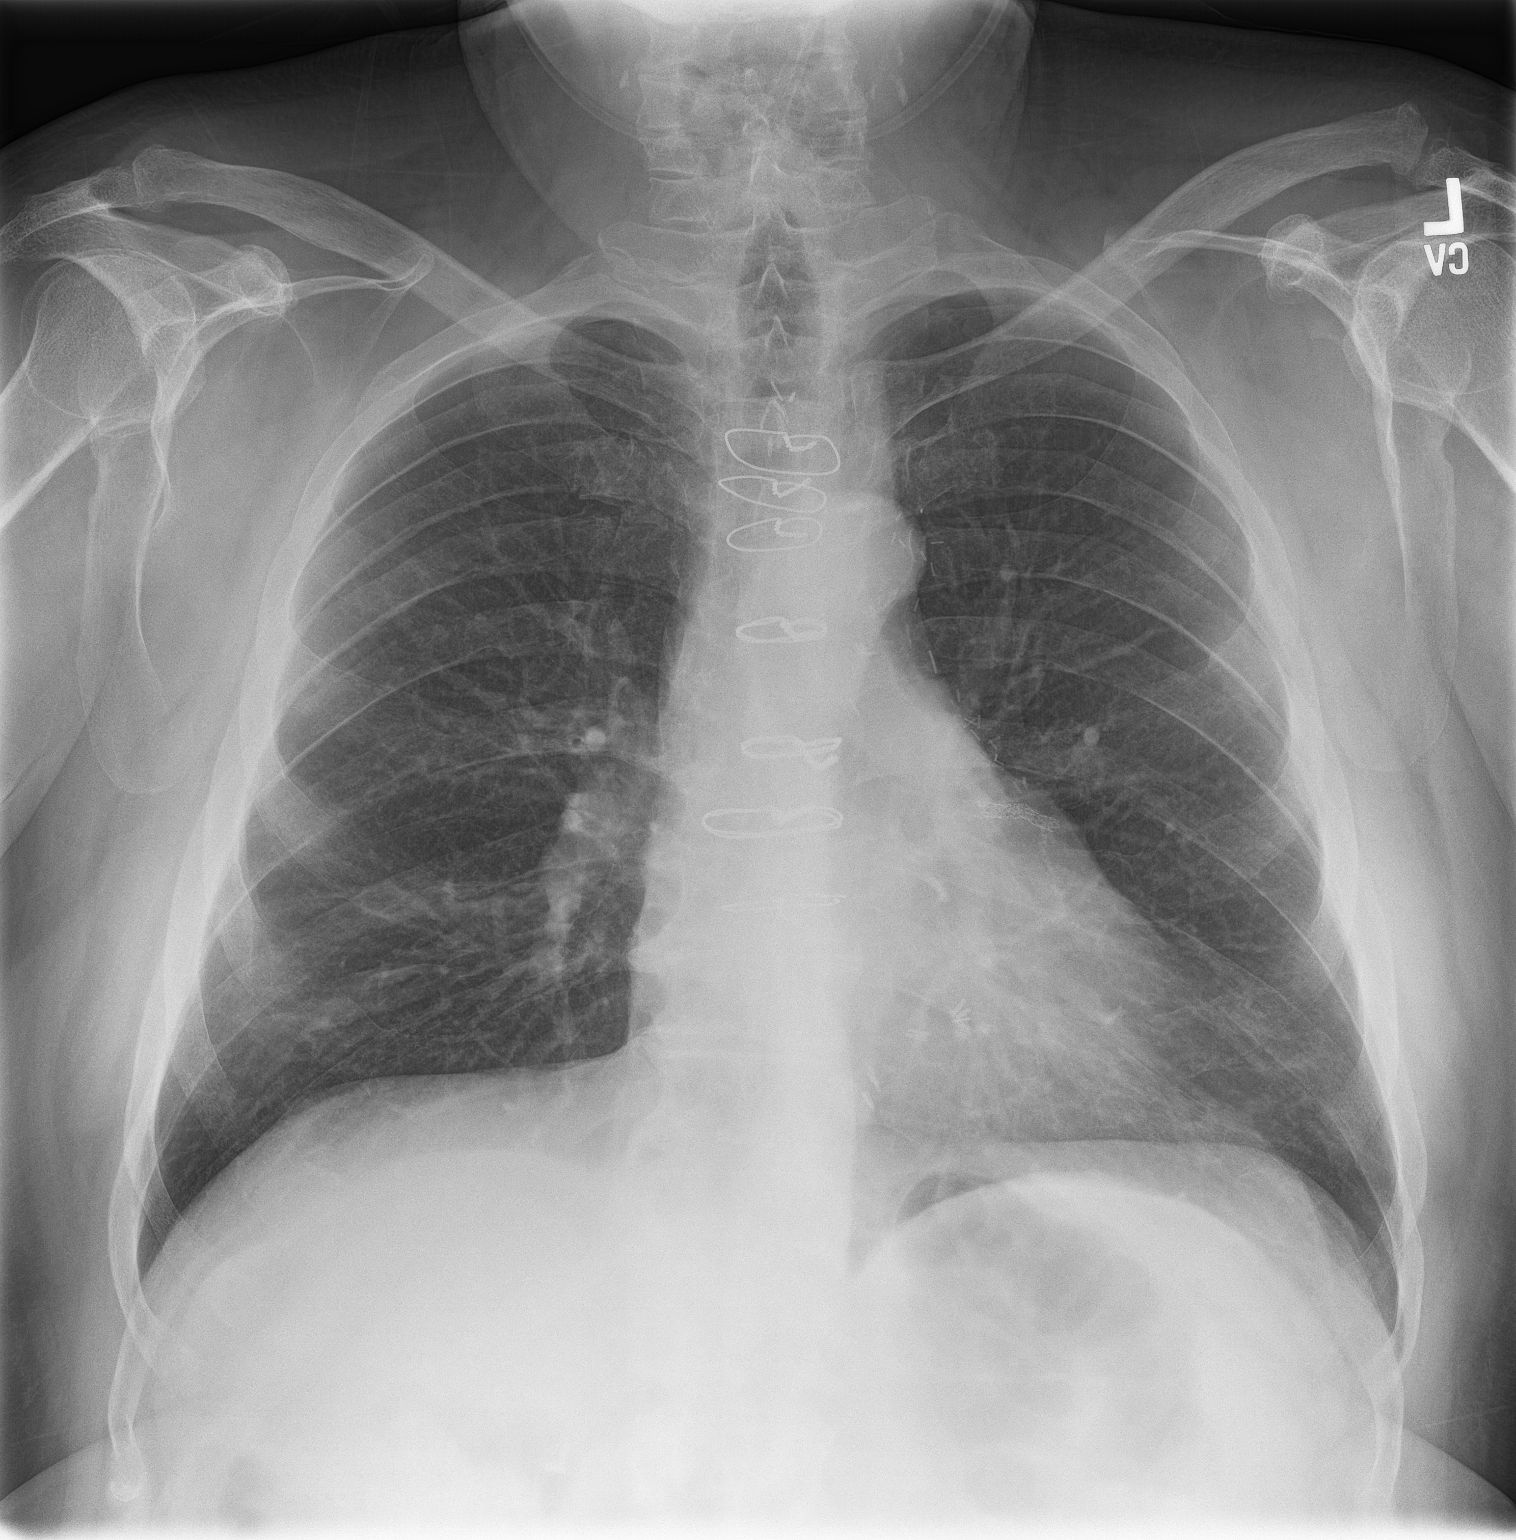
[im 2/2]
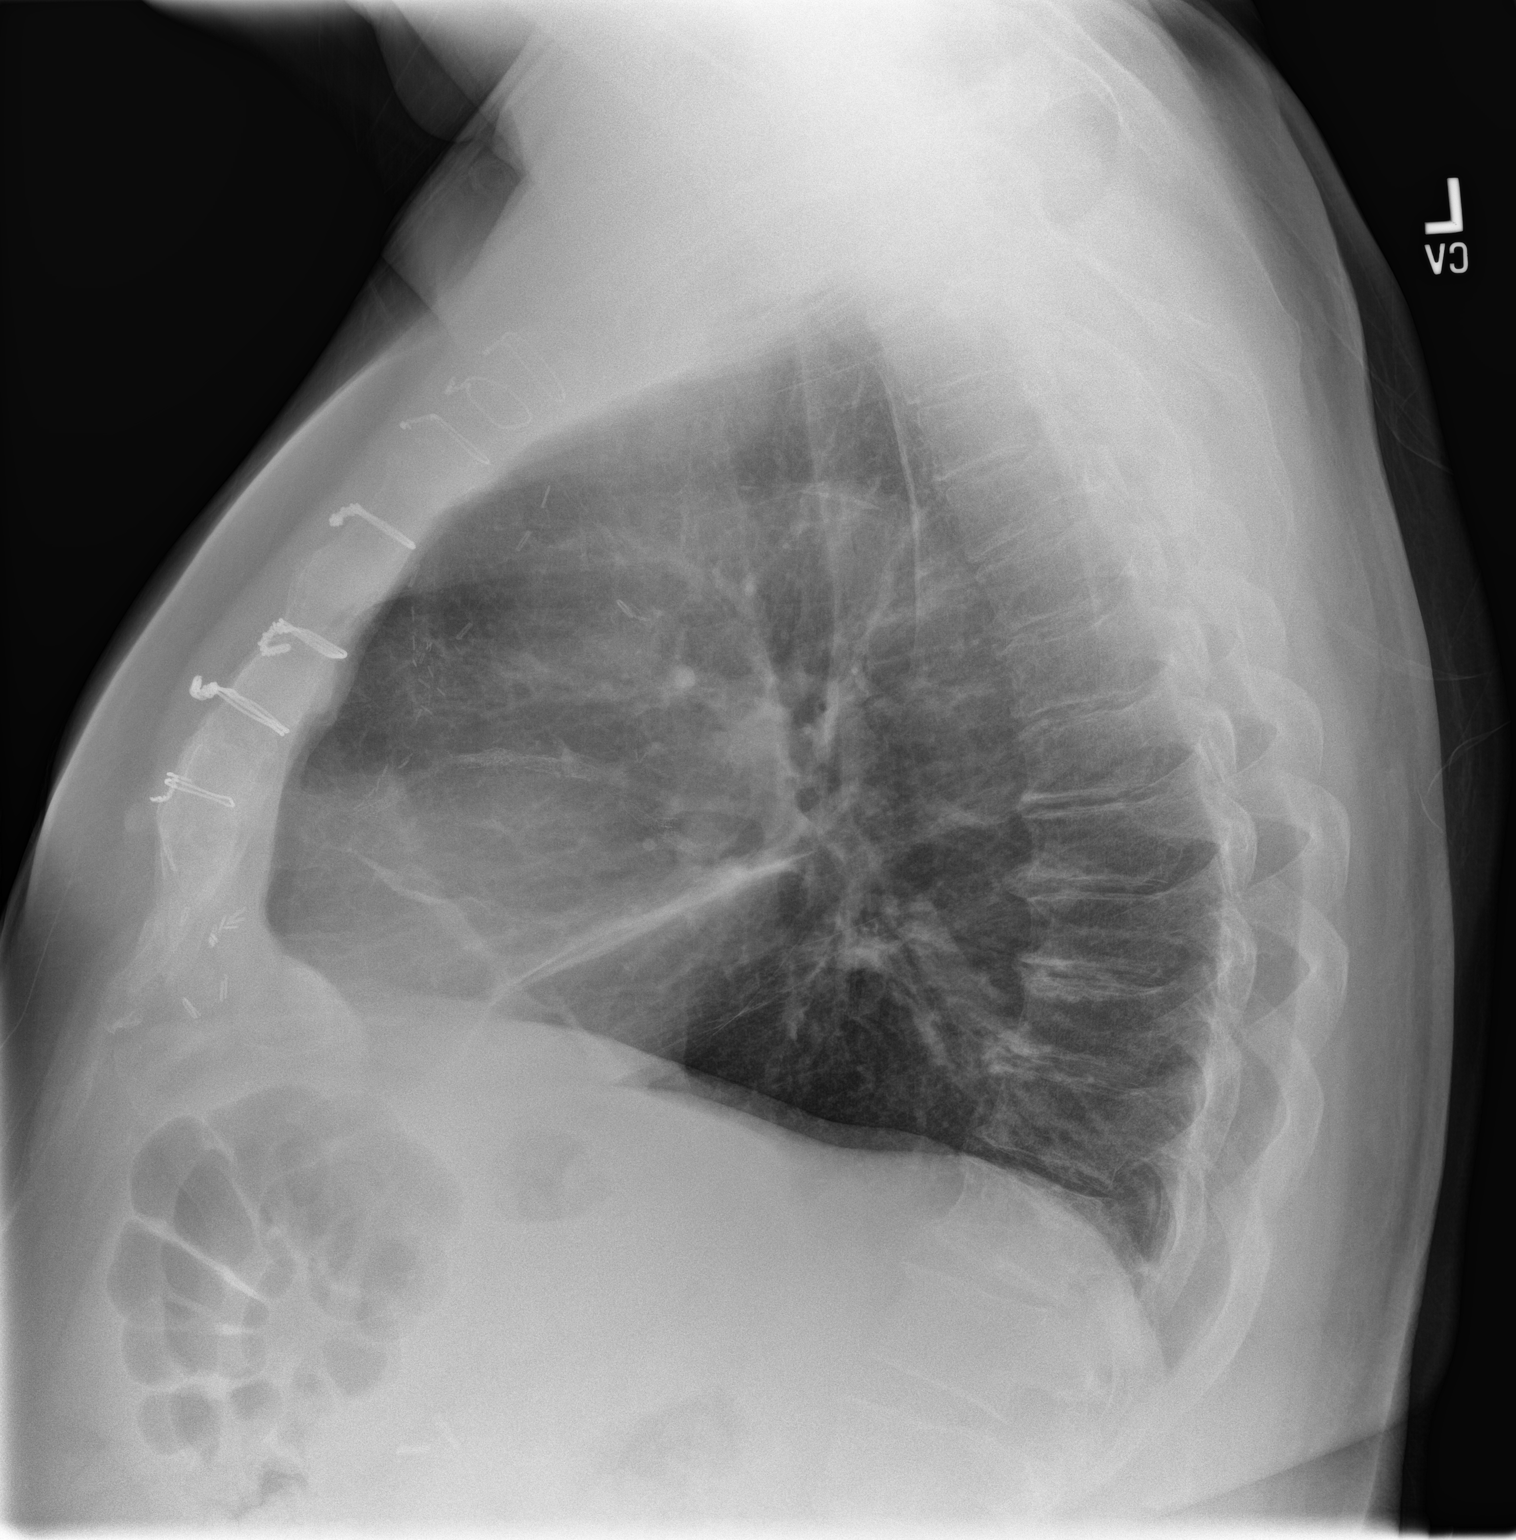

[2 of 2 positions shown; findings below may reference images not displayed]

FINDINGS: Prior CABG. Heart size normal. No focal infiltrate. Mild right base
subsegmental atelectasis. No pleural effusion or pneumothorax. No
acute bony abnormality.
IMPRESSION: 1. Prior CABG.

2.  Mild right base subsegmental atelectasis.

## 2017-12-07 ENCOUNTER — Other Ambulatory Visit: Payer: Self-pay | Admitting: Family Medicine

## 2017-12-08 ENCOUNTER — Other Ambulatory Visit (INDEPENDENT_AMBULATORY_CARE_PROVIDER_SITE_OTHER): Payer: Self-pay | Admitting: Vascular Surgery

## 2017-12-08 DIAGNOSIS — K220 Achalasia of cardia: Secondary | ICD-10-CM

## 2017-12-08 DIAGNOSIS — R1084 Generalized abdominal pain: Secondary | ICD-10-CM

## 2017-12-09 ENCOUNTER — Encounter (INDEPENDENT_AMBULATORY_CARE_PROVIDER_SITE_OTHER): Payer: Medicare HMO

## 2017-12-09 ENCOUNTER — Ambulatory Visit (INDEPENDENT_AMBULATORY_CARE_PROVIDER_SITE_OTHER): Payer: Medicare HMO | Admitting: Vascular Surgery

## 2017-12-12 ENCOUNTER — Other Ambulatory Visit: Payer: Self-pay

## 2017-12-12 ENCOUNTER — Emergency Department
Admission: EM | Admit: 2017-12-12 | Discharge: 2017-12-12 | Disposition: A | Discharge status: Home / Self Care | Payer: Medicare HMO | Attending: Emergency Medicine | Admitting: Emergency Medicine

## 2017-12-12 ENCOUNTER — Emergency Department: Payer: Medicare HMO

## 2017-12-12 DIAGNOSIS — E860 Dehydration: Secondary | ICD-10-CM

## 2017-12-12 DIAGNOSIS — Z7982 Long term (current) use of aspirin: Secondary | ICD-10-CM | POA: Diagnosis not present

## 2017-12-12 DIAGNOSIS — I5032 Chronic diastolic (congestive) heart failure: Secondary | ICD-10-CM | POA: Diagnosis not present

## 2017-12-12 DIAGNOSIS — Z87891 Personal history of nicotine dependence: Secondary | ICD-10-CM | POA: Insufficient documentation

## 2017-12-12 DIAGNOSIS — I2510 Atherosclerotic heart disease of native coronary artery without angina pectoris: Secondary | ICD-10-CM | POA: Insufficient documentation

## 2017-12-12 DIAGNOSIS — J441 Chronic obstructive pulmonary disease with (acute) exacerbation: Secondary | ICD-10-CM | POA: Diagnosis not present

## 2017-12-12 DIAGNOSIS — Z7984 Long term (current) use of oral hypoglycemic drugs: Secondary | ICD-10-CM | POA: Diagnosis not present

## 2017-12-12 DIAGNOSIS — R0602 Shortness of breath: Secondary | ICD-10-CM | POA: Diagnosis not present

## 2017-12-12 DIAGNOSIS — J439 Emphysema, unspecified: Secondary | ICD-10-CM | POA: Diagnosis not present

## 2017-12-12 DIAGNOSIS — J45909 Unspecified asthma, uncomplicated: Secondary | ICD-10-CM | POA: Insufficient documentation

## 2017-12-12 DIAGNOSIS — R05 Cough: Secondary | ICD-10-CM | POA: Diagnosis not present

## 2017-12-12 DIAGNOSIS — I130 Hypertensive heart and chronic kidney disease with heart failure and stage 1 through stage 4 chronic kidney disease, or unspecified chronic kidney disease: Secondary | ICD-10-CM | POA: Insufficient documentation

## 2017-12-12 DIAGNOSIS — E1122 Type 2 diabetes mellitus with diabetic chronic kidney disease: Secondary | ICD-10-CM | POA: Diagnosis not present

## 2017-12-12 DIAGNOSIS — N5089 Other specified disorders of the male genital organs: Secondary | ICD-10-CM

## 2017-12-12 DIAGNOSIS — G4710 Hypersomnia, unspecified: Secondary | ICD-10-CM | POA: Diagnosis not present

## 2017-12-12 DIAGNOSIS — Z79899 Other long term (current) drug therapy: Secondary | ICD-10-CM | POA: Diagnosis not present

## 2017-12-12 DIAGNOSIS — N50811 Right testicular pain: Secondary | ICD-10-CM | POA: Diagnosis not present

## 2017-12-12 DIAGNOSIS — J449 Chronic obstructive pulmonary disease, unspecified: Secondary | ICD-10-CM | POA: Diagnosis not present

## 2017-12-12 DIAGNOSIS — N189 Chronic kidney disease, unspecified: Secondary | ICD-10-CM | POA: Insufficient documentation

## 2017-12-12 DIAGNOSIS — I10 Essential (primary) hypertension: Secondary | ICD-10-CM | POA: Diagnosis not present

## 2017-12-12 LAB — COMPREHENSIVE METABOLIC PANEL
ALT: 16 U/L — ABNORMAL LOW (ref 17–63)
AST: 25 U/L (ref 15–41)
Albumin: 3.8 g/dL (ref 3.5–5.0)
Alkaline Phosphatase: 75 U/L (ref 38–126)
Anion gap: 10 (ref 5–15)
BUN: 23 mg/dL — ABNORMAL HIGH (ref 6–20)
CO2: 29 mmol/L (ref 22–32)
Calcium: 9.3 mg/dL (ref 8.9–10.3)
Chloride: 98 mmol/L — ABNORMAL LOW (ref 101–111)
Creatinine, Ser: 1.37 mg/dL — ABNORMAL HIGH (ref 0.61–1.24)
GFR calc Af Amer: >60 mL/min (ref >60)
GFR calc non Af Amer: 53 mL/min — ABNORMAL LOW (ref >60)
Glucose, Bld: 136 mg/dL — ABNORMAL HIGH (ref 65–99)
Potassium: 3.9 mmol/L (ref 3.5–5.1)
Sodium: 137 mmol/L (ref 135–145)
Total Bilirubin: 1 mg/dL (ref 0.3–1.2)
Total Protein: 7.1 g/dL (ref 6.5–8.1)

## 2017-12-12 LAB — CBC
HCT: 50 % (ref 40.0–52.0)
Hemoglobin: 16.1 g/dL (ref 13.0–18.0)
MCH: 28.6 pg (ref 26.0–34.0)
MCHC: 32.2 g/dL (ref 32.0–36.0)
MCV: 88.8 fL (ref 80.0–100.0)
Platelets: 205 10*3/uL (ref 150–440)
RBC: 5.63 MIL/uL (ref 4.40–5.90)
RDW: 16.1 % — ABNORMAL HIGH (ref 11.5–14.5)
WBC: 7.2 10*3/uL (ref 3.8–10.6)

## 2017-12-12 LAB — TROPONIN I: Troponin I: <0.03 ng/mL (ref <0.03)

## 2017-12-12 MED ORDER — METHYLPREDNISOLONE SODIUM SUCC 125 MG IJ SOLR
125.0000 mg | Freq: Once | INTRAMUSCULAR | Status: AC
  Administered 2017-12-12: 125 mg via INTRAVENOUS

## 2017-12-12 MED ORDER — IPRATROPIUM-ALBUTEROL 0.5-2.5 (3) MG/3ML IN SOLN
3.0000 mL | Freq: Once | RESPIRATORY_TRACT | Status: AC
  Administered 2017-12-12: 3 mL via RESPIRATORY_TRACT

## 2017-12-12 MED ORDER — SODIUM CHLORIDE 0.9 % IV BOLUS (SEPSIS)
500.0000 mL | Freq: Once | INTRAVENOUS | Status: AC
  Administered 2017-12-12: 500 mL via INTRAVENOUS

## 2017-12-12 MED ORDER — IPRATROPIUM-ALBUTEROL 0.5-2.5 (3) MG/3ML IN SOLN
RESPIRATORY_TRACT | Status: AC
  Administered 2017-12-12: 3 mL via RESPIRATORY_TRACT
  Filled 2017-12-12: qty 3

## 2017-12-12 MED ORDER — TRAMADOL HCL 50 MG PO TABS
50.0000 mg | ORAL_TABLET | Freq: Once | ORAL | Status: AC
  Administered 2017-12-12: 50 mg via ORAL

## 2017-12-12 MED ORDER — METHYLPREDNISOLONE SODIUM SUCC 125 MG IJ SOLR
INTRAMUSCULAR | Status: AC
  Administered 2017-12-12: 125 mg via INTRAVENOUS
  Filled 2017-12-12: qty 2

## 2017-12-12 MED ORDER — TRAMADOL HCL 50 MG PO TABS: 50.0000 mg | ORAL_TABLET | Freq: Four times a day (QID) | ORAL | 0 refills | Status: DC | PRN

## 2017-12-12 MED ORDER — TRAMADOL HCL 50 MG PO TABS
ORAL_TABLET | ORAL | Status: AC
  Administered 2017-12-12: 50 mg via ORAL
  Filled 2017-12-12: qty 1

## 2017-12-12 NOTE — ED Provider Notes (Signed)
[facility] Emergency Department Provider Note   ____________________________________________    I have reviewed the triage vital signs and the nursing notes.   HISTORY  Chief Complaint Shortness of Breath     HPI Lawrence [name] is a 62 y.o. male with extensive medical history as noted below on 2 L chronically who presents with mild shortness of breath and additional complaint of right sided testicular pain which started last night.  Patient reports his scrotum was mildly swollen last night but is improved today.  He denies dysuria.  No nausea or vomiting.  Denies chest pain.  Has had a productive cough but no fevers or chills.  No calf pain or swelling.  Has been using inhalers as directed.   Past Medical History:  Diagnosis Date  . A-fib ([place])   . Anemia   . Anxiety   . Asthma   . CAD (coronary artery disease)    a. 2002 CABGx2 (LIMA->LAD, VG->VG->OM1);  b. 09/2012 DES->OM;  c. 03/2015 PTCA of LAD [facility]) in setting of atretic LIMA; d. 05/2015 Cath [facility]): nonobs dzs; e. 06/2015 Cath ([facility]): LM nl, LAD 45p/d ISR, 50d, D1/2 small, LCX 50p/d ISR, OM1 70ost, 30 ISR, VG->OM1 50ost, 37m LIMA->LAD 99p/d - atretic, RCA dom, nl; f.cath 10/16: 40-50%(FFR 0.90) pLAD, 75% (FFR 0.77) mLAD s/p PCI/DES, oRCA 40% (FFR0.95)  . Celiac disease   . Chronic diastolic CHF (congestive heart failure) ([place]    a. 06/2009 Echo: EF 60-65%, Gr 1 DD, triv AI, mildly dil LA, nl RV.  .Marland KitchenCOPD (chronic obstructive pulmonary disease) ([place]    a. Chronic bronchitis and emphysema.  .Marland KitchenDysrhythmia   . Essential hypertension   . History of tobacco abuse    a. Quit 2014.  .Marland KitchenPSVT (paroxysmal supraventricular tachycardia) ([place]    a. 10/2012 Noted on Zio Patch.  . Type II diabetes mellitus ([facility]     Patient Active Problem List   Diagnosis Date Noted  . Abdominal pain, chronic, epigastric 11/06/2017  . Bilateral flank pain 03/24/2017  . Dyspnea 04/03/2016  . Hypotension 04/03/2016  .  Chronic kidney disease 04/03/2016  . Anemia 04/03/2016  . Paroxysmal atrial fibrillation ([place] 12/23/2015  . OSA (obstructive sleep apnea) 12/10/2015  . Left inguinal hernia 11/07/2015  . Anxiety 11/07/2015  . Unstable angina ([place] 10/05/2015  . Back pain with left-sided radiculopathy 09/30/2015  . Nocturnal hypoxia 09/06/2015  . BPH (benign prostatic hyperplasia) 08/01/2015  . Chronic diastolic CHF (congestive heart failure) ([place]   . Angina pectoris ([place]   . Chest pain 07/11/2015  . COPD (chronic obstructive pulmonary disease) ([place] 07/03/2015  . CAD (coronary artery disease) 06/26/2015  . HTN (hypertension) 06/26/2015  . DM (diabetes mellitus) ([place] 06/26/2015  . Achalasia 07/24/2014  . GERD (gastroesophageal reflux disease) 06/07/2014  . Former tobacco use 04/11/2013  . HLD (hyperlipidemia) 04/09/2013    Past Surgical History:  Procedure Laterality Date  . BYPASS GRAFT    . CARDIAC CATHETERIZATION N/A 07/12/2015   rocedure: Left Heart Cath and Cors/Grafts Angiography;  Surgeon: HBelva Crome MD;  [place] [place] [facility];  Service: Cardiovascular;  Laterality: N/A;  . CARDIAC CATHETERIZATION Right 10/07/2015   Procedure: Left Heart Cath and Cors/Grafts Angiography;  Surgeon: SDionisio David MD;  [place] [facility];  Service: Cardiovascular;  Laterality: Right;  . CARDIAC CATHETERIZATION N/A 04/06/2016   Procedure: Left Heart Cath and Coronary Angiography;  Surgeon: DYolonda Kida MD;  [facility] [place];  Service: Cardiovascular;  Laterality: N/A;  . CARDIAC CATHETERIZATION  04/06/2016   Procedure: Bypass Graft Angiography;  Surgeon: Yolonda Kida, MD;  Location: [place] CV LAB;  Service: Cardiovascular;;  . CARDIAC CATHETERIZATION N/A 11/02/2016   Procedure: Left Heart Cath and Cors/Grafts Angiography and possible PCI;  Surgeon: Yolonda Kida, MD;  Location: [place] CV LAB;  Service: Cardiovascular;  Laterality: N/A;  .  CARDIAC CATHETERIZATION N/A 11/02/2016   Procedure: Coronary Stent Intervention;  Surgeon: Yolonda Kida, MD;  Location: [place] CV LAB;  Service: Cardiovascular;  Laterality: N/A;  . CHOLECYSTECTOMY    . ESOPHAGEAL DILATION    . TONSILLECTOMY    . VASCULAR SURGERY      Prior to Admission medications   Medication Sig Start Date End Date Taking? Authorizing Provider  albuterol (PROVENTIL HFA;VENTOLIN HFA) 108 (90 Base) MCG/ACT inhaler Inhale 2 puffs into the lungs every 4 (four) hours as needed for wheezing or shortness of breath. Reported on 04/08/2016 10/21/17   Birdie Sons, MD  allopurinol (ZYLOPRIM) 100 MG tablet TAKE 3 TABLETS BY MOUTH EVERY DAY. 09/11/17   Birdie Sons, MD  ALPRAZolam Duanne Moron) 1 MG tablet TAKE ONE-HALF TO ONE TABLET BY MOUTH THREE TIMES DAILY AS NEEDED FOR ANXIETY 09/28/17   Birdie Sons, MD  aspirin 81 MG chewable tablet Chew 81 mg by mouth daily.    [provider]  atorvastatin (LIPITOR) 80 MG tablet TAKE ONE TABLET BY MOUTH EVERY NIGHT AT BEDTIME 08/13/17   Birdie Sons, MD  BANOPHEN 25 MG capsule TAKE 1 CAPSULE (25MG TOTAL) BY MOUTH EVERY 4 (FOUR) HOURS AS NEEDED. Patient not taking: Reported on 11/01/2017 07/14/17   Birdie Sons, MD  Blood Glucose Monitoring Suppl (ACCU-CHEK AVIVA PLUS) w/Device KIT CHECK BLOOD SUGAR EVERY DAY 12/08/17   Birdie Sons, MD  cetirizine (ZYRTEC) 10 MG tablet TAKE ONE TABLET BY MOUTH AT BEDTIME. Patient taking differently: 1-2 tid prn 05/12/17   Birdie Sons, MD  cyclobenzaprine (FLEXERIL) 5 MG tablet TAKE 1 TO 2 TABLETS BY MOUTH THREE TIMESDAILY AS NEEDED (BACK PAIN) 11/15/17   Birdie Sons, MD  docusate sodium (COLACE) 100 MG capsule Take 100 mg by mouth daily.     [provider]  ELIQUIS 5 MG TABS tablet TAKE 1 TABLET BY MOUTH TWICE A DAY. 07/28/17   Birdie Sons, MD  famotidine (PEPCID) 40 MG tablet Take 1 tablet (40 mg total) by mouth every evening. Patient not taking:  Reported on 11/01/2017 05/12/17 05/12/18  Birdie Sons, MD  fluticasone furoate-vilanterol (BREO ELLIPTA) 100-25 MCG/INH AEPB Inhale 1 puff into the lungs daily.     [provider]  glucose blood (ACCU-CHEK AVIVA PLUS) test strip Use to check blood sugar once a day Patient not taking: Reported on 11/01/2017 11/26/16   Birdie Sons, MD  hydrocortisone 1 % ointment Apply 1 application topically 2 (two) times daily. Apply to rash Patient not taking: Reported on 11/01/2017 06/19/17   [name], MD  isosorbide mononitrate (IMDUR) 60 MG 24 hr tablet Take 1 tablet (60 mg total) by mouth daily. Patient taking differently: Take 60 mg by mouth 2 (two) times daily.  11/04/16   Henreitta Leber, MD  JARDIANCE 10 MG TABS tablet TAKE 1 TABLET BY MOUTH EVERY DAY 07/14/17   Birdie Sons, MD  lansoprazole (PREVACID) 30 MG capsule Take 30 mg by mouth 2 (two) times daily.     [provider]  lisinopril (PRINIVIL,ZESTRIL) 2.5 MG tablet TAKE 1 TABLET BY MOUTH EVERY DAY 08/13/17   Darylene Price A, FNP  Magnesium Oxide 400 (240 Mg) MG TABS TAKE ONE TABLET BY MOUTH DAILY 03/23/17   Birdie Sons, MD  Magnesium Oxide 400 (240 Mg) MG TABS TAKE 1 TABLET BY MOUTH EVERY DAY Patient not taking: Reported on 11/01/2017 09/11/17   Alisa Graff, FNP  metFORMIN (GLUCOPHAGE) 500 MG tablet TAKE 1 TABLET BY MOUTH EVERY MORNING 11/30/17   Birdie Sons, MD  nitroGLYCERIN (NITROSTAT) 0.4 MG SL tablet Place 1 tablet (0.4 mg total) under the tongue every 5 (five) minutes as needed for chest pain. Reported on 05/26/2016 10/06/16   Birdie Sons, MD  omega-3 acid ethyl esters (LOVAZA) 1 g capsule TAKE FOUR CAPSULES BY MOUTH DAILY 06/04/17   Birdie Sons, MD  ondansetron (ZOFRAN) 4 MG tablet Take 1 tablet (4 mg total) by mouth every 8 (eight) hours as needed for nausea or vomiting. 11/08/17   Schuyler Amor, MD  oxyCODONE-acetaminophen (PERCOCET) 10-325 MG tablet Take 1 tablet by mouth every 4  (four) hours as needed. 12/02/17   Birdie Sons, MD  pantoprazole (PROTONIX) 40 MG tablet Take 40 mg by mouth daily.    [provider]  potassium chloride SA (K-DUR,KLOR-CON) 20 MEQ tablet  08/13/17   [provider]  RANEXA 500 MG 12 hr tablet Take 1,000 mg by mouth 2 (two) times daily.    [provider]  sotalol (BETAPACE) 120 MG tablet Take 120 mg by mouth 2 (two) times daily.     [provider]  SPIRIVA HANDIHALER 18 MCG inhalation capsule INHALE 1 CAPSULE VIA INHALER ONCE A DAY 01/25/17   Birdie Sons, MD  sucralfate (CARAFATE) 1 g tablet Take 1 tablet (1 g total) by mouth 2 (two) times daily. Patient not taking: Reported on 11/01/2017 05/03/17   [name], MD  sucralfate (CARAFATE) 1 g tablet Take by mouth. 10/29/17 10/29/18  [provider]  tamsulosin (FLOMAX) 0.4 MG CAPS capsule Take by mouth.    [provider]  torsemide (DEMADEX) 100 MG tablet Take 0.5 tablets by mouth daily. 10/11/17   [provider]  traMADol (ULTRAM) 50 MG tablet Take 1 tablet (50 mg total) by mouth every 6 (six) hours as needed. 12/12/17 12/12/18  Lavonia Drafts, MD     Allergies Demerol [meperidine]; Prednisone; Sulfa antibiotics; Albuterol sulfate [albuterol]; and Morphine sulfate  Family History  Problem Relation Age of Onset  . Heart attack Mother   . Depression Mother   . Heart disease Mother   . COPD Mother   . Hypertension Mother   . Heart attack Father   . Diabetes Father   . Depression Father   . Heart disease Father   . Cirrhosis Father   . Parkinson's disease Brother     Social History Social History   Tobacco Use  . Smoking status: Former Smoker    Last attempt to quit: 04/22/2013    Years since quitting: 4.6  . Smokeless tobacco: Never Used  Substance Use Topics  . Alcohol use: No    Comment: remotely quit alcohol use. Hx of heavy alcohol use.  . Drug use: No    Review of Systems  Constitutional: No  fever/chills Eyes: No visual changes.  ENT: No sore throat. Cardiovascular: Denies chest pain. Respiratory: As above Gastrointestinal: No abdominal pain.  No nausea, no vomiting.   Genitourinary: Negative for  dysuria.  Scrotal swelling as described above Musculoskeletal: Negative for back pain. Skin: Negative for rash. Neurological: Negative for headaches   ____________________________________________   PHYSICAL EXAM:  VITAL SIGNS: ED Triage Vitals  Enc Vitals Group     BP 12/12/17 2119 140/79     Pulse Rate 12/12/17 2119 66     Resp 12/12/17 2119 20     Temp 12/12/17 2119 97.9 F (36.6 C)     Temp Source 12/12/17 2119 Oral     SpO2 12/12/17 2119 96 %     Weight 12/12/17 2120 106.1 kg (234 lb)     Height 12/12/17 2120 1.702 m (5' 7" )     Head Circumference --      Peak Flow --      Pain Score 12/12/17 2120 9     Pain Loc --      Pain Edu? --      Excl. in [place]? --     Constitutional: Alert and oriented. No acute distress.  Eyes: Conjunctivae are normal.   Nose: No congestion/rhinnorhea. Mouth/Throat: Mucous membranes are moist.    Cardiovascular: Normal rate, regular rhythm. Kermit Balo peripheral circulation. Respiratory: Normal respiratory effort.  No retractions.  Scattered mild wheezes Gastrointestinal: Soft and nontender. No distention.   Genitourinary: Unremarkable testicular exam Musculoskeletal: No lower extremity tenderness nor edema.  Warm and well perfused Neurologic:  Normal speech and language. No gross focal neurologic deficits are appreciated.  Skin:  Skin is warm, dry and intact. No rash noted. Psychiatric: Mood and affect are normal. Speech and behavior are normal.  ____________________________________________   LABS (all labs ordered are listed, but only abnormal results are displayed)  Labs Reviewed  CBC - Abnormal; Notable for the following components:      Result Value   RDW 16.1 (*)    All other components within normal limits    COMPREHENSIVE METABOLIC PANEL - Abnormal; Notable for the following components:   Chloride 98 (*)    Glucose, Bld 136 (*)    BUN 23 (*)    Creatinine, Ser 1.37 (*)    ALT 16 (*)    GFR calc non Af Amer 53 (*)    All other components within normal limits  TROPONIN I   ____________________________________________  EKG  ED ECG REPORT I, Lavonia Drafts, the attending physician, personally viewed and interpreted this ECG.  Date: 12/12/2017  Rhythm: normal sinus rhythm QRS Axis: normal Intervals: normal ST/T Wave abnormalities: normal Narrative Interpretation: no evidence of acute ischemia  ____________________________________________  RADIOLOGY  Chest x-ray unremarkable ____________________________________________   PROCEDURES  Procedure(s) performed: No  Procedures   Critical Care performed: No ____________________________________________   INITIAL IMPRESSION / ASSESSMENT AND PLAN / ED COURSE  Pertinent labs & imaging results that were available during my care of the patient were reviewed by me and considered in my medical decision making (see chart for details).  Patient presents with complaints as described above, will treat likely mild COPD exacerbation with Solu-Medrol, duo nebs check labs and x-ray.  Will obtain ultrasound of the scrotum, suspect possible small hydrocele.  ----------------------------------------- 10:38 PM on 12/12/2017 -----------------------------------------  Patient reports his breathing is better.  Lab work significant for mild elevation in BUN and creatinine, 500 cc fluid bolus given as well as 50 mg of tramadol for his discomfort.   Testicular ultrasound normal.  Recommend p.o. analgesics and outpatient follow-up with PCP/urology as needed if no improvement   ____________________________________________   FINAL CLINICAL IMPRESSION(S) / ED  DIAGNOSES  Final diagnoses:  COPD exacerbation ([place])  Testicular pain, right   Dehydration        Note:  This document was prepared using Dragon voice recognition software and may include unintentional dictation errors.    Lavonia Drafts, MD 12/12/17 2249

## 2017-12-12 NOTE — ED Notes (Signed)
Patient transported to Ultrasound 

## 2017-12-12 NOTE — ED Triage Notes (Signed)
Pt states history of copd. Pt states he is coughing with brown/green sputum production for 3 days. Pt also complains of "privates pain". Pt denies known fever, but states "I felt hot". Pt appears in no acute distress, is on 2lpm [place] oxygen. Clear breath sounds auscultated.

## 2017-12-17 DIAGNOSIS — J449 Chronic obstructive pulmonary disease, unspecified: Secondary | ICD-10-CM | POA: Diagnosis not present

## 2017-12-17 DIAGNOSIS — G4710 Hypersomnia, unspecified: Secondary | ICD-10-CM | POA: Diagnosis not present

## 2017-12-17 DIAGNOSIS — J439 Emphysema, unspecified: Secondary | ICD-10-CM | POA: Diagnosis not present

## 2017-12-17 DIAGNOSIS — I10 Essential (primary) hypertension: Secondary | ICD-10-CM | POA: Diagnosis not present

## 2017-12-30 ENCOUNTER — Other Ambulatory Visit: Payer: Self-pay | Admitting: Family Medicine

## 2017-12-30 MED ORDER — OXYCODONE-ACETAMINOPHEN 10-325 MG PO TABS: 1.0000 | ORAL_TABLET | ORAL | 0 refills | Status: DC | PRN

## 2017-12-30 NOTE — Telephone Encounter (Signed)
Pt contacted office for refill request on the following medications:  oxyCODONE-acetaminophen (PERCOCET) 10-325 MG tablet  [facility]  Last Rx: 12/02/17 LOV: 10/12/17 Please advise. Thanks TNP

## 2018-01-03 ENCOUNTER — Ambulatory Visit: Payer: Self-pay | Admitting: Family Medicine

## 2018-01-03 ENCOUNTER — Ambulatory Visit (INDEPENDENT_AMBULATORY_CARE_PROVIDER_SITE_OTHER): Payer: Medicare HMO

## 2018-01-03 ENCOUNTER — Ambulatory Visit (INDEPENDENT_AMBULATORY_CARE_PROVIDER_SITE_OTHER): Payer: Medicare HMO | Admitting: Vascular Surgery

## 2018-01-03 ENCOUNTER — Encounter (INDEPENDENT_AMBULATORY_CARE_PROVIDER_SITE_OTHER): Payer: Self-pay | Admitting: Vascular Surgery

## 2018-01-03 VITALS — BP 153/92 | HR 66 | Resp 16 | Wt 236.6 lb

## 2018-01-03 DIAGNOSIS — I10 Essential (primary) hypertension: Secondary | ICD-10-CM | POA: Diagnosis not present

## 2018-01-03 DIAGNOSIS — K220 Achalasia of cardia: Secondary | ICD-10-CM

## 2018-01-03 DIAGNOSIS — R1084 Generalized abdominal pain: Secondary | ICD-10-CM

## 2018-01-03 DIAGNOSIS — R1013 Epigastric pain: Secondary | ICD-10-CM | POA: Diagnosis not present

## 2018-01-03 DIAGNOSIS — I25118 Atherosclerotic heart disease of native coronary artery with other forms of angina pectoris: Secondary | ICD-10-CM

## 2018-01-03 DIAGNOSIS — K2100 Gastro-esophageal reflux disease with esophagitis, without bleeding: Secondary | ICD-10-CM

## 2018-01-03 DIAGNOSIS — G8929 Other chronic pain: Secondary | ICD-10-CM

## 2018-01-03 DIAGNOSIS — I480 Paroxysmal atrial fibrillation: Secondary | ICD-10-CM

## 2018-01-03 NOTE — Progress Notes (Signed)
MRN : [id number]  Lawrence [name] is a 62 y.o. (1955-12-10) male who presents with chief complaint of  Chief Complaint  Patient presents with  . Follow-up    pt conv mesentric ultrasound  .  History of Present Illness: The patient returns for follow up regarding nonspecific abdominal pain with a concern for mesenteric ischemia.  There has not been any change in his symptoms since the last visit  No prior peripheral angiograms or vascular interventions.  The patient denies amaurosis fugax or recent TIA symptoms. There are no recent neurological changes noted. The patient denies claudication symptoms or rest pain symptoms. The patient denies history of DVT, PE or superficial thrombophlebitis. The patient denies recent episodes of angina       Current Meds  Medication Sig  . albuterol (PROVENTIL HFA;VENTOLIN HFA) 108 (90 Base) MCG/ACT inhaler Inhale 2 puffs into the lungs every 4 (four) hours as needed for wheezing or shortness of breath. Reported on 04/08/2016  . allopurinol (ZYLOPRIM) 100 MG tablet TAKE 3 TABLETS BY MOUTH EVERY DAY.  Marland Kitchen ALPRAZolam (XANAX) 1 MG tablet TAKE ONE-HALF TO ONE TABLET BY MOUTH THREE TIMES DAILY AS NEEDED FOR ANXIETY  . aspirin 81 MG chewable tablet Chew 81 mg by mouth daily.  Marland Kitchen atorvastatin (LIPITOR) 80 MG tablet TAKE ONE TABLET BY MOUTH EVERY NIGHT AT BEDTIME  . Blood Glucose Monitoring Suppl (ACCU-CHEK AVIVA PLUS) w/Device KIT CHECK BLOOD SUGAR EVERY DAY  . cetirizine (ZYRTEC) 10 MG tablet TAKE ONE TABLET BY MOUTH AT BEDTIME. (Patient taking differently: 1-2 tid prn)  . cyclobenzaprine (FLEXERIL) 5 MG tablet TAKE 1 TO 2 TABLETS BY MOUTH THREE TIMESDAILY AS NEEDED (BACK PAIN)  . docusate sodium (COLACE) 100 MG capsule Take 100 mg by mouth daily.   Marland Kitchen ELIQUIS 5 MG TABS tablet TAKE 1 TABLET BY MOUTH TWICE A DAY.  . fluticasone furoate-vilanterol (BREO ELLIPTA) 100-25 MCG/INH AEPB Inhale 1 puff into the lungs daily.   . isosorbide mononitrate (IMDUR) 60 MG  24 hr tablet Take 1 tablet (60 mg total) by mouth daily. (Patient taking differently: Take 60 mg by mouth 2 (two) times daily. )  . JARDIANCE 10 MG TABS tablet TAKE 1 TABLET BY MOUTH EVERY DAY  . lansoprazole (PREVACID) 30 MG capsule Take 30 mg by mouth 2 (two) times daily.   Marland Kitchen lisinopril (PRINIVIL,ZESTRIL) 2.5 MG tablet TAKE 1 TABLET BY MOUTH EVERY DAY  . Magnesium Oxide 400 (240 Mg) MG TABS TAKE ONE TABLET BY MOUTH DAILY  . metFORMIN (GLUCOPHAGE) 500 MG tablet TAKE 1 TABLET BY MOUTH EVERY MORNING  . nitroGLYCERIN (NITROSTAT) 0.4 MG SL tablet Place 1 tablet (0.4 mg total) under the tongue every 5 (five) minutes as needed for chest pain. Reported on 05/26/2016  . omega-3 acid ethyl esters (LOVAZA) 1 g capsule TAKE FOUR CAPSULES BY MOUTH DAILY  . ondansetron (ZOFRAN) 4 MG tablet Take 1 tablet (4 mg total) by mouth every 8 (eight) hours as needed for nausea or vomiting.  Marland Kitchen oxyCODONE-acetaminophen (PERCOCET) 10-325 MG tablet Take 1 tablet by mouth every 4 (four) hours as needed.  . pantoprazole (PROTONIX) 40 MG tablet Take 40 mg by mouth daily.  . potassium chloride SA (K-DUR,KLOR-CON) 20 MEQ tablet   . RANEXA 500 MG 12 hr tablet Take 1,000 mg by mouth 2 (two) times daily.  . sotalol (BETAPACE) 120 MG tablet Take 120 mg by mouth 2 (two) times daily.   Marland Kitchen SPIRIVA HANDIHALER 18 MCG inhalation capsule INHALE 1 CAPSULE VIA  INHALER ONCE A DAY  . sucralfate (CARAFATE) 1 g tablet Take by mouth.  . tamsulosin (FLOMAX) 0.4 MG CAPS capsule Take by mouth.  . torsemide (DEMADEX) 100 MG tablet Take 0.5 tablets by mouth daily.  . traMADol (ULTRAM) 50 MG tablet Take 1 tablet (50 mg total) by mouth every 6 (six) hours as needed.    Past Medical History:  Diagnosis Date  . A-fib ([place])   . Anemia   . Anxiety   . Asthma   . CAD (coronary artery disease)    a. 2002 CABGx2 (LIMA->LAD, VG->VG->OM1);  b. 09/2012 DES->OM;  c. 03/2015 PTCA of LAD [facility]) in setting of atretic LIMA; d. 05/2015 Cath [facility]): nonobs dzs; e.  06/2015 Cath ([facility]): LM nl, LAD 45p/d ISR, 50d, D1/2 small, LCX 50p/d ISR, OM1 70ost, 30 ISR, VG->OM1 50ost, 34m LIMA->LAD 99p/d - atretic, RCA dom, nl; f.cath 10/16: 40-50%(FFR 0.90) pLAD, 75% (FFR 0.77) mLAD s/p PCI/DES, oRCA 40% (FFR0.95)  . Celiac disease   . Chronic diastolic CHF (congestive heart failure) ([place]    a. 06/2009 Echo: EF 60-65%, Gr 1 DD, triv AI, mildly dil LA, nl RV.  .Marland KitchenCOPD (chronic obstructive pulmonary disease) ([place]    a. Chronic bronchitis and emphysema.  .Marland KitchenDysrhythmia   . Essential hypertension   . History of tobacco abuse    a. Quit 2014.  .Marland KitchenPSVT (paroxysmal supraventricular tachycardia) ([place]    a. 10/2012 Noted on Zio Patch.  . Type II diabetes mellitus [place]     Past Surgical History:  Procedure Laterality Date  . BYPASS GRAFT    . CARDIAC CATHETERIZATION N/A 07/12/2015   rocedure: Left Heart Cath and Cors/Grafts Angiography;  Surgeon: HBelva Crome MD;  [facility];  Service: Cardiovascular;  Laterality: N/A;  . CARDIAC CATHETERIZATION Right 10/07/2015   Procedure: Left Heart Cath and Cors/Grafts Angiography;  Surgeon: SDionisio David MD;  [facility];  Service: Cardiovascular;  Laterality: Right;  . CARDIAC CATHETERIZATION N/A 04/06/2016   Procedure: Left Heart Cath and Coronary Angiography;  Surgeon: DYolonda Kida MD;  [place];  Service: Cardiovascular;  Laterality: N/A;  . CARDIAC CATHETERIZATION  04/06/2016   Procedure: Bypass Graft Angiography;  Surgeon: DYolonda Kida MD;  [place];  Service: Cardiovascular;;  . CARDIAC CATHETERIZATION N/A 11/02/2016   Procedure: Left Heart Cath and Cors/Grafts Angiography and possible PCI;  Surgeon: DYolonda Kida MD;  [place];  Service: Cardiovascular;  Laterality: N/A;  . CARDIAC CATHETERIZATION N/A 11/02/2016   Procedure: Coronary Stent Intervention;  Surgeon: DYolonda Kida MD;  [place];  Service: Cardiovascular;  Laterality: N/A;  . CHOLECYSTECTOMY    . ESOPHAGEAL DILATION    . TONSILLECTOMY    . VASCULAR SURGERY      Social History Social History   Tobacco Use  . Smoking status: Former Smoker    Last attempt to quit: 04/22/2013    Years since quitting: 4.7  . Smokeless tobacco: Never Used  Substance Use Topics  . Alcohol use: No    Comment: remotely quit alcohol use. Hx of heavy alcohol use.  . Drug use: No    Family History Family History  Problem Relation Age of Onset  . Heart attack Mother   . Depression Mother   . Heart disease Mother   . COPD Mother   . Hypertension Mother   . Heart attack Father   .  Diabetes Father   . Depression Father   . Heart disease Father   . Cirrhosis Father   . Parkinson's disease Brother     Allergies  Allergen Reactions  . Demerol [Meperidine] Hives  . Prednisone Other (See Comments) and Hypertension    Pt states that this medication puts him in A-fib   . Sulfa Antibiotics Hives  . Albuterol Sulfate [Albuterol] Palpitations and Other (See Comments)    Pt currently uses this medication.    . Morphine Sulfate Nausea And Vomiting, Rash and Other (See Comments)    Pt states that he is only allergic to the tablet form of this medication.       REVIEW OF SYSTEMS (Negative unless checked)  Constitutional: [] Weight loss  [] Fever  [] Chills Cardiac: [] Chest pain   [] Chest pressure   [] Palpitations   [] Shortness of breath when laying flat   [] Shortness of breath with exertion. Vascular:  [] Pain in legs with walking   [] Pain in legs at rest  [] History of DVT   [] Phlebitis   [] Swelling in legs   [] Varicose veins   [] Non-healing ulcers Pulmonary:   [] Uses home oxygen   [] Productive cough   [] Hemoptysis   [] Wheeze  [] COPD   [] Asthma Neurologic:  [] Dizziness   [] Seizures   [] History of stroke   [] History of TIA  [] Aphasia   [] Vissual changes   [] Weakness or numbness in arm   [] Weakness or numbness in  leg Musculoskeletal:   [] Joint swelling   [] Joint pain   [] Low back pain Hematologic:  [] Easy bruising  [] Easy bleeding   [] Hypercoagulable state   [] Anemic Gastrointestinal:  [] Diarrhea   [] Vomiting  [x] Gastroesophageal reflux/heartburn   [] Difficulty swallowing. Genitourinary:  [] Chronic kidney disease   [] Difficult urination  [] Frequent urination   [] Blood in urine Skin:  [] Rashes   [] Ulcers  Psychological:  [] History of anxiety   []  History of major depression.  Physical Examination  Vitals:   01/03/18 0935  BP: (!) 153/92  Pulse: 66  Resp: 16  Weight: 236 lb 9.6 oz (107.3 kg)   Body mass index is 37.06 kg/m. Gen: WD/WN, NAD Head: [place]/AT, No temporalis wasting.  Ear/Nose/Throat: Hearing grossly intact, nares w/o erythema or drainage Eyes: PER, EOMI, sclera nonicteric.  Neck: Supple, no large masses.   Pulmonary:  Good air movement, no audible wheezing bilaterally, no use of accessory muscles.  Cardiac: RRR, no JVD Vascular: Vessel Right Left  Radial Palpable Palpable  PT Palpable Palpable  DP Palpable Palpable  Gastrointestinal: Non-distended. No guarding/no peritoneal signs.  Musculoskeletal: M/S 5/5 throughout.  No deformity or atrophy.  Neurologic: CN 2-12 intact. Symmetrical.  Speech is fluent. Motor exam as listed above. Psychiatric: Judgment intact, Mood & affect appropriate for pt's clinical situation. Dermatologic: No rashes or ulcers noted.  No changes consistent with cellulitis. Lymph : No lichenification or skin changes of chronic lymphedema.  CBC Lab Results  Component Value Date   WBC 7.2 12/12/2017   HGB 16.1 12/12/2017   HCT 50.0 12/12/2017   MCV 88.8 12/12/2017   PLT 205 12/12/2017    BMET    Component Value Date/Time   NA 137 12/12/2017 2132   NA 143 10/27/2016 1435   NA 143 03/07/2015 1851   K 3.9 12/12/2017 2132   K 3.9 03/07/2015 1851   CL 98 (L) 12/12/2017 2132   CL 107 03/07/2015 1851   CO2 29 12/12/2017 2132   CO2 29 03/07/2015 1851    GLUCOSE 136 (H) 12/12/2017 2132  GLUCOSE 99 03/07/2015 1851   BUN 23 (H) 12/12/2017 2132   BUN 10 10/27/2016 1435   BUN 9 03/07/2015 1851   CREATININE 1.37 (H) 12/12/2017 2132   CREATININE 0.84 03/07/2015 1851   CALCIUM 9.3 12/12/2017 2132   CALCIUM 9.6 03/07/2015 1851   GFRNONAA 53 (L) 12/12/2017 2132   GFRNONAA >60 03/07/2015 1851   GFRAA >60 12/12/2017 2132   GFRAA >60 03/07/2015 1851   CrCl cannot be calculated (Patient's most recent lab result is older than the maximum 21 days allowed.).  COAG Lab Results  Component Value Date   INR 1.20 06/04/2017   INR 1.10 10/29/2016   INR 1.45 04/03/2016    Radiology Dg Chest 2 View  Result Date: 12/12/2017 CLINICAL DATA:  Acute onset shortness of breath. Cough. Coronary artery disease. EXAM: CHEST  2 VIEW COMPARISON:  11/08/2017 FINDINGS: The heart size and mediastinal contours are within normal limits. Prior CABG. Coarsening of interstitial markings is stable. No evidence of acute infiltrate or edema. No evidence of pleural effusion. The visualized skeletal structures are unremarkable. IMPRESSION: No active cardiopulmonary disease. Electronically Signed   By: Earle Gell M.D.   On: 12/12/2017 21:49   US Scrotum  Result Date: 12/12/2017 CLINICAL DATA:  Right testicular pain and swelling for 1 day. Vasectomy 35 years ago. EXAM: SCROTAL ULTRASOUND DOPPLER ULTRASOUND OF THE TESTICLES TECHNIQUE: Complete ultrasound examination of the testicles, epididymis, and other scrotal structures was performed. Color and spectral Doppler ultrasound were also utilized to evaluate blood flow to the testicles. COMPARISON:  None. FINDINGS: Right testicle Measurements: 3.8 x 2.5 x 2.7 cm. No mass or microlithiasis visualized. Left testicle Measurements: 3.9 x 2.4 x 2.4 cm. No mass or microlithiasis visualized. Right epididymis:  Normal in size and appearance. Left epididymis:  Small epididymal cyst or spermatocele. Hydrocele:  None visualized. Varicocele:   None visualized. Pulsed Doppler interrogation of both testes demonstrates normal low resistance arterial and venous waveforms bilaterally. Homogeneous normal symmetrical flow to both testicles on color flow Doppler imaging. IMPRESSION: Normal ultrasound appearance of the testicles. No evidence of testicular mass, torsion, or inflammatory process. Electronically Signed   By: Lucienne Capers M.D.   On: 12/12/2017 22:41   [place] Scrotom Doppler  Result Date: 12/12/2017 CLINICAL DATA:  Right testicular pain and swelling for 1 day. Vasectomy 35 years ago. EXAM: SCROTAL ULTRASOUND DOPPLER ULTRASOUND OF THE TESTICLES TECHNIQUE: Complete ultrasound examination of the testicles, epididymis, and other scrotal structures was performed. Color and spectral Doppler ultrasound were also utilized to evaluate blood flow to the testicles. COMPARISON:  None. FINDINGS: Right testicle Measurements: 3.8 x 2.5 x 2.7 cm. No mass or microlithiasis visualized. Left testicle Measurements: 3.9 x 2.4 x 2.4 cm. No mass or microlithiasis visualized. Right epididymis:  Normal in size and appearance. Left epididymis:  Small epididymal cyst or spermatocele. Hydrocele:  None visualized. Varicocele:  None visualized. Pulsed Doppler interrogation of both testes demonstrates normal low resistance arterial and venous waveforms bilaterally. Homogeneous normal symmetrical flow to both testicles on color flow Doppler imaging. IMPRESSION: Normal ultrasound appearance of the testicles. No evidence of testicular mass, torsion, or inflammatory process. Electronically Signed   By: Lucienne Capers M.D.   On: 12/12/2017 22:41     Assessment/Plan 1. Abdominal pain, chronic, epigastric Recommend:  Patient's duplex of the mesenteric arteries is normal without evidence of stricture or stenosis.    No indication for intervention or surgery  Patient's most likely etiology for his pain is the esophageal inflammation noted on  CT scan  He will follow up  PRN  2. Gastroesophageal reflux disease with esophagitis Continue antihypertensive medications as already ordered, these medications have been reviewed and there are no changes at this time.  Avoidence of caffeine and alcohol  Moderate elevation of the head of the bed   3. Paroxysmal atrial fibrillation (HCC) Continue antiarrhythmia medications as already ordered, these medications have been reviewed and there are no changes at this time.  Continue anticoagulation as ordered by Cardiology Service   4. Essential hypertension Continue antihypertensive medications as already ordered, these medications have been reviewed and there are no changes at this time.   5. Coronary artery disease involving native coronary artery of native heart with other form of angina pectoris ([place]) Continue cardiac and antihypertensive medications as already ordered and reviewed, no changes at this time.  Continue statin as ordered and reviewed, no changes at this time  Nitrates PRN for chest pain     Hortencia Pilar, MD  01/03/2018 11:54 AM

## 2018-01-04 ENCOUNTER — Ambulatory Visit (INDEPENDENT_AMBULATORY_CARE_PROVIDER_SITE_OTHER): Payer: Medicare HMO

## 2018-01-04 ENCOUNTER — Other Ambulatory Visit: Payer: Self-pay | Admitting: *Deleted

## 2018-01-04 ENCOUNTER — Ambulatory Visit (INDEPENDENT_AMBULATORY_CARE_PROVIDER_SITE_OTHER): Payer: Medicare HMO | Admitting: Family Medicine

## 2018-01-04 VITALS — BP 138/84 | HR 64 | Temp 97.9°F | Ht 67.0 in | Wt 242.8 lb

## 2018-01-04 DIAGNOSIS — J410 Simple chronic bronchitis: Secondary | ICD-10-CM | POA: Diagnosis not present

## 2018-01-04 DIAGNOSIS — I10 Essential (primary) hypertension: Secondary | ICD-10-CM

## 2018-01-04 DIAGNOSIS — K2100 Gastro-esophageal reflux disease with esophagitis, without bleeding: Secondary | ICD-10-CM

## 2018-01-04 DIAGNOSIS — Z125 Encounter for screening for malignant neoplasm of prostate: Secondary | ICD-10-CM

## 2018-01-04 DIAGNOSIS — Z0000 Encounter for general adult medical examination without abnormal findings: Secondary | ICD-10-CM

## 2018-01-04 DIAGNOSIS — E785 Hyperlipidemia, unspecified: Secondary | ICD-10-CM

## 2018-01-04 DIAGNOSIS — E1159 Type 2 diabetes mellitus with other circulatory complications: Secondary | ICD-10-CM | POA: Diagnosis not present

## 2018-01-04 MED ORDER — ALBUTEROL SULFATE (2.5 MG/3ML) 0.083% IN NEBU: 2.5000 mg | INHALATION_SOLUTION | Freq: Four times a day (QID) | RESPIRATORY_TRACT | 1 refills | Status: DC | PRN

## 2018-01-04 MED ORDER — SUCRALFATE 1 G PO TABS: 1.0000 g | ORAL_TABLET | Freq: Two times a day (BID) | ORAL | 0 refills | Status: DC

## 2018-01-04 NOTE — Patient Instructions (Signed)
Lawrence [name] , Thank you for taking time to come for your Medicare Wellness Visit. I appreciate your ongoing commitment to your health goals. Please review the following plan we discussed and let me know if I can assist you in the future.   Screening recommendations/referrals: Colonoscopy: Up to date Recommended yearly ophthalmology/optometry visit for glaucoma screening and checkup Recommended yearly dental visit for hygiene and checkup  Vaccinations: Influenza vaccine: Up to date Pneumococcal vaccine: Up to date Tdap vaccine: Up to date Shingles vaccine: Pt awaiting to receive the Shingrix #2 from pharmacy.    Advanced directives: Advance directive discussed with you today. Even though you declined this today please call our office should you change your mind and we can give you the proper paperwork for you to fill out.  Conditions/risks identified: Fall prevenetion; Obesity- recommend eating 3 small meals a day with two healthy snacks in between.   Next appointment: today @ 3:00 pm with Dr [name].  Preventive Care 40-64 Years, Male Preventive care refers to lifestyle choices and visits with your health care provider that can promote health and wellness. What does preventive care include?  A yearly physical exam. This is also called an annual well check.  Dental exams once or twice a year.  Routine eye exams. Ask your health care provider how often you should have your eyes checked.  Personal lifestyle choices, including:  Daily care of your teeth and gums.  Regular physical activity.  Eating a healthy diet.  Avoiding tobacco and drug use.  Limiting alcohol use.  Practicing safe sex.  Taking low-dose aspirin every day starting at age 29. What happens during an annual well check? The services and screenings done by your health care provider during your annual well check will depend on your age, overall health, lifestyle risk factors, and family history of  disease. Counseling  Your health care provider may ask you questions about your:  Alcohol use.  Tobacco use.  Drug use.  Emotional well-being.  Home and relationship well-being.  Sexual activity.  Eating habits.  Work and work Statistician. Screening  You may have the following tests or measurements:  Height, weight, and BMI.  Blood pressure.  Lipid and cholesterol levels. These may be checked every 5 years, or more frequently if you are over 48 years old.  Skin check.  Lung cancer screening. You may have this screening every year starting at age 28 if you have a 30-pack-year history of smoking and currently smoke or have quit within the past 15 years.  Fecal occult blood test (FOBT) of the stool. You may have this test every year starting at age 2.  Flexible sigmoidoscopy or colonoscopy. You may have a sigmoidoscopy every 5 years or a colonoscopy every 10 years starting at age 43.  Prostate cancer screening. Recommendations will vary depending on your family history and other risks.  Hepatitis C blood test.  Hepatitis B blood test.  Sexually transmitted disease (STD) testing.  Diabetes screening. This is done by checking your blood sugar (glucose) after you have not eaten for a while (fasting). You may have this done every 1-3 years. Discuss your test results, treatment options, and if necessary, the need for more tests with your health care provider. Vaccines  Your health care provider may recommend certain vaccines, such as:  Influenza vaccine. This is recommended every year.  Tetanus, diphtheria, and acellular pertussis (Tdap, Td) vaccine. You may need a Td booster every 10 years.  Zoster vaccine. You may need  this after age 35.  Pneumococcal 13-valent conjugate (PCV13) vaccine. You may need this if you have certain conditions and have not been vaccinated.  Pneumococcal polysaccharide (PPSV23) vaccine. You may need one or two doses if you smoke cigarettes  or if you have certain conditions. Talk to your health care provider about which screenings and vaccines you need and how often you need them. This information is not intended to replace advice given to you by your health care provider. Make sure you discuss any questions you have with your health care provider. Document Released: 11/29/2015 Document Revised: 07/22/2016 Document Reviewed: 09/03/2015 Elsevier Interactive Patient Education  2017 [place] Prevention in the Home Falls can cause injuries. They can happen to people of all ages. There are many things you can do to make your home safe and to help prevent falls. What can I do on the outside of my home?  Regularly fix the edges of walkways and driveways and fix any cracks.  Remove anything that might make you trip as you walk through a door, such as a raised step or threshold.  Trim any bushes or trees on the path to your home.  Use bright outdoor lighting.  Clear any walking paths of anything that might make someone trip, such as rocks or tools.  Regularly check to see if handrails are loose or broken. Make sure that both sides of any steps have handrails.  Any raised decks and porches should have guardrails on the edges.  Have any leaves, snow, or ice cleared regularly.  Use sand or salt on walking paths during winter.  Clean up any spills in your garage right away. This includes oil or grease spills. What can I do in the bathroom?  Use night lights.  Install grab bars by the toilet and in the tub and shower. Do not use towel bars as grab bars.  Use non-skid mats or decals in the tub or shower.  If you need to sit down in the shower, use a plastic, non-slip stool.  Keep the floor dry. Clean up any water that spills on the floor as soon as it happens.  Remove soap buildup in the tub or shower regularly.  Attach bath mats securely with double-sided non-slip rug tape.  Do not have throw rugs and other  things on the floor that can make you trip. What can I do in the bedroom?  Use night lights.  Make sure that you have a light by your bed that is easy to reach.  Do not use any sheets or blankets that are too big for your bed. They should not hang down onto the floor.  Have a firm chair that has side arms. You can use this for support while you get dressed.  Do not have throw rugs and other things on the floor that can make you trip. What can I do in the kitchen?  Clean up any spills right away.  Avoid walking on wet floors.  Keep items that you use a lot in easy-to-reach places.  If you need to reach something above you, use a strong step stool that has a grab bar.  Keep electrical cords out of the way.  Do not use floor polish or wax that makes floors slippery. If you must use wax, use non-skid floor wax.  Do not have throw rugs and other things on the floor that can make you trip. What can I do with my stairs?  Do not  leave any items on the stairs.  Make sure that there are handrails on both sides of the stairs and use them. Fix handrails that are broken or loose. Make sure that handrails are as long as the stairways.  Check any carpeting to make sure that it is firmly attached to the stairs. Fix any carpet that is loose or worn.  Avoid having throw rugs at the top or bottom of the stairs. If you do have throw rugs, attach them to the floor with carpet tape.  Make sure that you have a light switch at the top of the stairs and the bottom of the stairs. If you do not have them, ask someone to add them for you. What else can I do to help prevent falls?  Wear shoes that:  Do not have high heels.  Have rubber bottoms.  Are comfortable and fit you well.  Are closed at the toe. Do not wear sandals.  If you use a stepladder:  Make sure that it is fully opened. Do not climb a closed stepladder.  Make sure that both sides of the stepladder are locked into place.  Ask  someone to hold it for you, if possible.  Clearly mark and make sure that you can see:  Any grab bars or handrails.  First and last steps.  Where the edge of each step is.  Use tools that help you move around (mobility aids) if they are needed. These include:  Canes.  Walkers.  Scooters.  Crutches.  Turn on the lights when you go into a dark area. Replace any light bulbs as soon as they burn out.  Set up your furniture so you have a clear path. Avoid moving your furniture around.  If any of your floors are uneven, fix them.  If there are any pets around you, be aware of where they are.  Review your medicines with your doctor. Some medicines can make you feel dizzy. This can increase your chance of falling. Ask your doctor what other things that you can do to help prevent falls. This information is not intended to replace advice given to you by your health care provider. Make sure you discuss any questions you have with your health care provider. Document Released: 08/29/2009 Document Revised: 04/09/2016 Document Reviewed: 12/07/2014 Elsevier Interactive Patient Education  2017 [employer].

## 2018-01-04 NOTE — Progress Notes (Signed)
Patient: Lawrence [name], Male    DOB: 04-Feb-1956, 62 y.o.   MRN: [id number] Visit Date: 01/04/2018  Today's Provider: [name], MD   No chief complaint on file.  Subjective:   Patient saw [facility]enzie for AWV today at 2:15 pm.   Annual physical exam Lawrence [name] is a 62 y.o. male who presents today for health maintenance and complete physical. He feels fairly well. He reports exercising none. He reports he is sleeping fairly well.  -----------------------------------------------------------------  Diabetes Mellitus Type II, Follow-up:   Lab Results  Component Value Date   HGBA1C 6.1 08/31/2017   HGBA1C 6.3 05/28/2017   HGBA1C 7.4 02/26/2017    He reports good compliance with treatment. He is not having side effects.   ----------------------------------------------------------------    Hypertension, follow-up:  BP Readings from Last 3 Encounters:  01/03/18 (!) 153/92  12/12/17 (!) 128/53  11/08/17 137/79    He reports he is taking medications consistently without adverse effect.  ----------------------------------------------------------------   Coronary artery disease involving native coronary artery of native heart with other form of angina pectoris ([place]) From 08/31/2017-no changes were made. Denies chest pain, follows up [name] regularly.   Chronic diastolic CHF (congestive heart failure) ([place]) From 08/31/2017-no changes. Stable.No change in breathing or swlling.   OSA (obstructive sleep apnea) From 08/31/2017-no changes. Using CPAP consistently.  Back pain with left-sided radiculopathy From 08/31/2017-no changes. Stable on current regiment of oxycodone/apap .    Review of Systems  Constitutional: Positive for fatigue. Negative for chills, diaphoresis and fever.  HENT: Positive for congestion. Negative for ear discharge, ear pain, hearing loss, nosebleeds, sore throat and tinnitus.   Eyes: Negative for photophobia, pain, discharge and  redness.  Respiratory: Negative for cough, shortness of breath, wheezing and stridor.   Cardiovascular: Negative for chest pain, palpitations and leg swelling.  Gastrointestinal: Negative for abdominal pain, blood in stool, constipation, diarrhea, nausea and vomiting.  Endocrine: Negative for polydipsia.  Genitourinary: Negative for dysuria, flank pain, frequency, hematuria and urgency.  Musculoskeletal: Positive for back pain. Negative for myalgias and neck pain.  Skin: Negative for rash.  Allergic/Immunologic: Negative for environmental allergies.  Neurological: Negative for dizziness, tremors, seizures, weakness and headaches.  Hematological: Does not bruise/bleed easily.  Psychiatric/Behavioral: Negative for hallucinations and suicidal ideas. The patient is not nervous/anxious.      Social History      He  reports that he quit smoking about 4 years ago. he has never used smokeless tobacco. He reports that he does not drink alcohol or use drugs.       Social History   Socioeconomic History  . Marital status: Married    Spouse name: Not on file  . Number of children: Not on file  . Years of education: Not on file  . Highest education level: Not on file  Social Needs  . Financial resource strain: Not on file  . Food insecurity - worry: Not on file  . Food insecurity - inability: Not on file  . Transportation needs - medical: Not on file  . Transportation needs - non-medical: Not on file  Occupational History  . Occupation: Disabled  Tobacco Use  . Smoking status: Former Smoker    Last attempt to quit: 04/22/2013    Years since quitting: 4.7  . Smokeless tobacco: Never Used  Substance and Sexual Activity  . Alcohol use: No    Comment: remotely quit alcohol use. Hx of heavy alcohol use.  . Drug  use: No  . Sexual activity: Not on file  Other Topics Concern  . Not on file  Social History Narrative   Pt lives in [name] with wife.  Does not routinely exercise.    Past  Medical History:  Diagnosis Date  . A-fib ([place])   . Anemia   . Anxiety   . Asthma   . CAD (coronary artery disease)    a. 2002 CABGx2 (LIMA->LAD, VG->VG->OM1);  b. 09/2012 DES->OM;  c. 03/2015 PTCA of LAD [facility]) in setting of atretic LIMA; d. 05/2015 Cath [facility]): nonobs dzs; e. 06/2015 Cath ([facility]): LM nl, LAD 45p/d ISR, 50d, D1/2 small, LCX 50p/d ISR, OM1 70ost, 30 ISR, VG->OM1 50ost, 75m LIMA->LAD 99p/d - atretic, RCA dom, nl; f.cath 10/16: 40-50%(FFR 0.90) pLAD, 75% (FFR 0.77) mLAD s/p PCI/DES, oRCA 40% (FFR0.95)  . Celiac disease   . Chronic diastolic CHF (congestive heart failure) ([place]    a. 06/2009 Echo: EF 60-65%, Gr 1 DD, triv AI, mildly dil LA, nl RV.  .Marland KitchenCOPD (chronic obstructive pulmonary disease) ([place]    a. Chronic bronchitis and emphysema.  .Marland KitchenDysrhythmia   . Essential hypertension   . History of tobacco abuse    a. Quit 2014.  .Marland KitchenPSVT (paroxysmal supraventricular tachycardia) ([place]    a. 10/2012 Noted on Zio Patch.  . Type II diabetes mellitus ([facility]      Patient Active Problem List   Diagnosis Date Noted  . Abdominal pain, chronic, epigastric 11/06/2017  . Bilateral flank pain 03/24/2017  . Dyspnea 04/03/2016  . Hypotension 04/03/2016  . Chronic kidney disease 04/03/2016  . Anemia 04/03/2016  . Paroxysmal atrial fibrillation ([place] 12/23/2015  . OSA (obstructive sleep apnea) 12/10/2015  . Left inguinal hernia 11/07/2015  . Anxiety 11/07/2015  . Unstable angina ([place] 10/05/2015  . Back pain with left-sided radiculopathy 09/30/2015  . Nocturnal hypoxia 09/06/2015  . BPH (benign prostatic hyperplasia) 08/01/2015  . Chronic diastolic CHF (congestive heart failure) ([place]   . Angina pectoris ([place]   . Chest pain 07/11/2015  . COPD (chronic obstructive pulmonary disease) ([place] 07/03/2015  . CAD (coronary artery disease) 06/26/2015  . HTN (hypertension) 06/26/2015  . DM (diabetes mellitus) ([place] 06/26/2015  . Achalasia 07/24/2014  . GERD (gastroesophageal reflux disease)  06/07/2014  . Former tobacco use 04/11/2013  . HLD (hyperlipidemia) 04/09/2013    Past Surgical History:  Procedure Laterality Date  . BYPASS GRAFT    . CARDIAC CATHETERIZATION N/A 07/12/2015   rocedure: Left Heart Cath and Cors/Grafts Angiography;  Surgeon: HBelva Crome MD;  [place];  Service: Cardiovascular;  Laterality: N/A;  . CARDIAC CATHETERIZATION Right 10/07/2015   Procedure: Left Heart Cath and Cors/Grafts Angiography;  Surgeon: SDionisio David MD;  [place] [facility];  Service: Cardiovascular;  Laterality: Right;  . CARDIAC CATHETERIZATION N/A 04/06/2016   Procedure: Left Heart Cath and Coronary Angiography;  Surgeon: DYolonda Kida MD;  [facility];  Service: Cardiovascular;  Laterality: N/A;  . CARDIAC CATHETERIZATION  04/06/2016   Procedure: Bypass Graft Angiography;  Surgeon: DYolonda Kida MD;  [facility];  Service: Cardiovascular;;  . CARDIAC CATHETERIZATION N/A 11/02/2016   Procedure: Left Heart Cath and Cors/Grafts Angiography and possible PCI;  Surgeon: DYolonda Kida MD;  [place];  Service: Cardiovascular;  Laterality: N/A;  . CARDIAC CATHETERIZATION N/A 11/02/2016   Procedure: Coronary Stent Intervention;  Surgeon: DYolonda Kida MD;  [facility];  Service: Cardiovascular;  Laterality: N/A;  . CHOLECYSTECTOMY    . ESOPHAGEAL DILATION    . TONSILLECTOMY    . VASCULAR SURGERY      Family History        Family Status  Relation Name Status  . Mother  Deceased at age 63  . Father  Deceased at age 56   . Brother  Deceased  . Sister  Deceased at age 6        His family history includes COPD in his mother; Cirrhosis in his father; Depression in his father and mother; Diabetes in his father; Heart attack in his father and mother; Heart disease in his father and mother; Hypertension in his mother; Parkinson's disease in his brother.       Allergies  Allergen Reactions  . Demerol [Meperidine] Hives  . Prednisone Other (See Comments) and Hypertension    Pt states that this medication puts him in A-fib   . Sulfa Antibiotics Hives  . Albuterol Sulfate [Albuterol] Palpitations and Other (See Comments)    Pt currently uses this medication.    . Morphine Sulfate Nausea And Vomiting, Rash and Other (See Comments)    Pt states that he is only allergic to the tablet form of this medication.       Current Outpatient Medications:  .  albuterol (PROVENTIL HFA;VENTOLIN HFA) 108 (90 Base) MCG/ACT inhaler, Inhale 2 puffs into the lungs every 4 (four) hours as needed for wheezing or shortness of breath. Reported on 04/08/2016, Disp: 1 Inhaler, Rfl: 5 .  allopurinol (ZYLOPRIM) 100 MG tablet, TAKE 3 TABLETS BY MOUTH EVERY DAY., Disp: 90 tablet, Rfl: 4 .  ALPRAZolam (XANAX) 1 MG tablet, TAKE ONE-HALF TO ONE TABLET BY MOUTH THREE TIMES DAILY AS NEEDED FOR ANXIETY, Disp: 90 tablet, Rfl: 5 .  aspirin 81 MG chewable tablet, Chew 81 mg by mouth daily., Disp: , Rfl:  .  atorvastatin (LIPITOR) 80 MG tablet, TAKE ONE TABLET BY MOUTH EVERY NIGHT AT BEDTIME, Disp: 90 tablet, Rfl: 4 .  BANOPHEN 25 MG capsule, TAKE 1 CAPSULE (25MG TOTAL) BY MOUTH EVERY 4 (FOUR) HOURS AS NEEDED. (Patient not taking: Reported on 11/01/2017), Disp: 30 capsule, Rfl: 5 .  Blood Glucose Monitoring Suppl (ACCU-CHEK AVIVA PLUS) w/Device KIT, CHECK BLOOD SUGAR EVERY DAY, Disp: 1 kit, Rfl: 0 .  cetirizine (ZYRTEC) 10 MG tablet, TAKE ONE TABLET BY MOUTH AT BEDTIME. (Patient taking differently: 1-2 tid prn), Disp: 30 tablet, Rfl: 11 .  cyclobenzaprine (FLEXERIL) 5 MG tablet, TAKE 1 TO 2 TABLETS BY MOUTH THREE TIMESDAILY AS NEEDED (BACK PAIN), Disp: 60 tablet, Rfl: 5 .  docusate sodium (COLACE) 100 MG capsule, Take 100 mg by mouth daily. , Disp: , Rfl:  .  ELIQUIS 5 MG TABS tablet, TAKE 1 TABLET BY MOUTH TWICE A DAY., Disp: 60 tablet, Rfl: 12 .  fluticasone furoate-vilanterol (BREO  ELLIPTA) 100-25 MCG/INH AEPB, Inhale 1 puff into the lungs daily. , Disp: , Rfl:  .  glucose blood (ACCU-CHEK AVIVA PLUS) test strip, Use to check blood sugar once a day (Patient not taking: Reported on 11/01/2017), Disp: 100 each, Rfl: 4 .  hydrocortisone 1 % ointment, Apply 1 application topically 2 (two) times daily. Apply to rash (Patient not taking: Reported on 11/01/2017), Disp: 30 g, Rfl: 0 .  isosorbide mononitrate (IMDUR) 60 MG 24 hr tablet, Take 1 tablet (60 mg total) by mouth daily. (Patient taking differently: Take 60 mg by mouth 2 (two) times daily. ),  Disp: 60 tablet, Rfl: 1 .  JARDIANCE 10 MG TABS tablet, TAKE 1 TABLET BY MOUTH EVERY DAY, Disp: 30 tablet, Rfl: 12 .  lansoprazole (PREVACID) 30 MG capsule, Take 30 mg by mouth 2 (two) times daily. , Disp: , Rfl:  .  lisinopril (PRINIVIL,ZESTRIL) 2.5 MG tablet, TAKE 1 TABLET BY MOUTH EVERY DAY, Disp: 30 tablet, Rfl: 5 .  Magnesium Oxide 400 (240 Mg) MG TABS, TAKE ONE TABLET BY MOUTH DAILY, Disp: 30 tablet, Rfl: 12 .  metFORMIN (GLUCOPHAGE) 500 MG tablet, TAKE 1 TABLET BY MOUTH EVERY MORNING, Disp: 90 tablet, Rfl: 4 .  nitroGLYCERIN (NITROSTAT) 0.4 MG SL tablet, Place 1 tablet (0.4 mg total) under the tongue every 5 (five) minutes as needed for chest pain. Reported on 05/26/2016, Disp: 30 tablet, Rfl: 1 .  omega-3 acid ethyl esters (LOVAZA) 1 g capsule, TAKE FOUR CAPSULES BY MOUTH DAILY, Disp: 120 capsule, Rfl: 12 .  ondansetron (ZOFRAN) 4 MG tablet, Take 1 tablet (4 mg total) by mouth every 8 (eight) hours as needed for nausea or vomiting., Disp: 8 tablet, Rfl: 0 .  oxyCODONE-acetaminophen (PERCOCET) 10-325 MG tablet, Take 1 tablet by mouth every 4 (four) hours as needed., Disp: 150 tablet, Rfl: 0 .  pantoprazole (PROTONIX) 40 MG tablet, Take 40 mg by mouth daily., Disp: , Rfl:  .  potassium chloride SA (K-DUR,KLOR-CON) 20 MEQ tablet, , Disp: , Rfl:  .  RANEXA 500 MG 12 hr tablet, Take 1,000 mg by mouth 2 (two) times daily., Disp: , Rfl:  .   sotalol (BETAPACE) 120 MG tablet, Take 120 mg by mouth 2 (two) times daily. , Disp: , Rfl:  .  SPIRIVA HANDIHALER 18 MCG inhalation capsule, INHALE 1 CAPSULE VIA INHALER ONCE A DAY, Disp: 30 capsule, Rfl: 12 .  sucralfate (CARAFATE) 1 g tablet, Take 1 tablet (1 g total) by mouth 2 (two) times daily. (Patient not taking: Reported on 11/01/2017), Disp: 20 tablet, Rfl: 0 .  sucralfate (CARAFATE) 1 g tablet, Take by mouth., Disp: , Rfl:  .  tamsulosin (FLOMAX) 0.4 MG CAPS capsule, Take by mouth., Disp: , Rfl:  .  torsemide (DEMADEX) 100 MG tablet, Take 0.5 tablets by mouth daily., Disp: , Rfl:  .  traMADol (ULTRAM) 50 MG tablet, Take 1 tablet (50 mg total) by mouth every 6 (six) hours as needed., Disp: 20 tablet, Rfl: 0   Patient Care Team: Birdie Sons, MD as PCP - General (Family Medicine) Allyne Gee, MD as Consulting Physician (Pulmonary Disease) Corey Skains, MD as Consulting Physician (Cardiology) Alisa Graff, FNP as Nurse Practitioner (Family Medicine)      Objective:   Physical Exam  General Appearance:    Alert, cooperative, no distress, appears stated age  Head:    Normocephalic, without obvious abnormality, atraumatic  Eyes:    PERRL, conjunctiva/corneas clear, EOM's intact, fundi    benign, both eyes       Ears:    Normal TM's and external ear canals, both ears  Nose:   Nares normal, septum midline, mucosa normal, no drainage   or sinus tenderness  Throat:   Lips, mucosa, and tongue normal; teeth and gums normal  Neck:   Supple, symmetrical, trachea midline, no adenopathy;       thyroid:  No enlargement/tenderness/nodules; no carotid   bruit or JVD  Back:     Symmetric, no curvature, ROM normal, no CVA tenderness  Lungs:     Clear to auscultation bilaterally, respirations unlabored  Chest wall:    No tenderness or deformity  Heart:    Regular rate and rhythm, S1 and S2 normal, no murmur, rub   or gallop  Abdomen:     Soft, non-tender, bowel sounds active  all four quadrants,    no masses, no organomegaly  Genitalia:    deferred  Rectal:    deferred  Extremities:   Extremities normal, atraumatic, no cyanosis or edema  Pulses:   2+ and symmetric all extremities  Skin:   Skin color, texture, turgor normal, no rashes or lesions  Lymph nodes:   Cervical, supraclavicular, and axillary nodes normal  Neurologic:   CNII-XII intact. Normal strength, sensation and reflexes      throughout     Depression Screen PHQ 2/9 Scores 06/02/2017 05/31/2017 03/24/2017 03/10/2017  PHQ - 2 Score 0 0 0 0  PHQ- 9 Score - - - -      Assessment & Plan:     Routine Health Maintenance and Physical Exam  Exercise Activities and Dietary recommendations Goals    None      Immunization History  Administered Date(s) Administered  . Influenza,inj,Quad PF,6+ Mos 08/02/2015, 07/17/2016, 08/13/2017  . Pneumococcal Polysaccharide-23 05/06/2004, 08/22/2015  . Td 10/28/2009, 08/31/2017  . Tdap 05/13/2011  . Zoster Recombinat (Shingrix) 08/13/2017    Health Maintenance  Topic Date Due  . OPHTHALMOLOGY EXAM  01/07/1966  . HEMOGLOBIN A1C  03/01/2018  . FOOT EXAM  08/31/2018  . COLONOSCOPY  03/10/2020  . TETANUS/TDAP  09/01/2027  . PNEUMOCOCCAL POLYSACCHARIDE VACCINE  Completed  . INFLUENZA VACCINE  Completed  . Hepatitis C Screening  Completed  . HIV Screening  Completed     Discussed health benefits of physical activity, and encouraged him to engage in regular exercise appropriate for his age and condition.    --------------------------------------------------------------------  1. Annual physical exam   2. Prostate cancer screening  - PSA  3. Essential hypertension BP up somewhat today. He is to reduce sodium intake. Continue current medications.  Is usually better controlled.   4. Simple chronic bronchitis (HCC) refill- albuterol (PROVENTIL) (2.5 MG/3ML) 0.083% nebulizer solution; Take 3 mLs (2.5 mg total) by nebulization every 6 (six) hours as  needed for wheezing or shortness of breath.  Dispense: 150 mL; Refill: 1  5. Gastroesophageal reflux disease with esophagitis Has been bothering him more lately did well on - sucralfate (CARAFATE) 1 g tablet; Take 1 tablet (1 g total) by mouth 2 (two) times daily.  Dispense: 60 tablet; Refill: 0 in the past which is refilled today.   6. Hyperlipidemia, unspecified hyperlipidemia type He is tolerating atorvastatin well with no adverse effects.   - Lipid panel  7. Type 2 diabetes mellitus with other circulatory complication, without long-term current use of insulin (HCC)  - Hemoglobin A1c    [name], MD  [facility]

## 2018-01-04 NOTE — Progress Notes (Signed)
Subjective:   Lawrence [name] is a 62 y.o. male who presents for Medicare Annual/Subsequent preventive examination.  Review of Systems:  N/A  Cardiac Risk Factors include: advanced age (>54mn, >>24women);diabetes mellitus;hypertension;male gender;obesity (BMI >30kg/m2);dyslipidemia     Objective:    Vitals: BP 138/84 (BP Location: Left Arm)   Pulse 64   Temp 97.9 F (36.6 C) (Oral)   Ht _0  (1.702 m)   Wt 242 lb 12.8 oz (110.1 kg)   BMI 38.03 kg/m   Body mass index is 38.03 kg/m.  Advanced Directives 01/04/2018 11/08/2017 10/13/2017 10/01/2017 09/30/2017 06/18/2017 06/04/2017  Does Patient Have a Medical Advance Directive? _1  No No  Type of Advance Directive - - - - - - -  Does patient want to make changes to medical advance directive? - - - - - - -  [place]? - - - - - - -  Would patient like information on creating a medical advance directive? No - Patient declined - No - Patient declined Yes (Inpatient - patient requests chaplain consult to create a medical advance directive) - - -    Tobacco Social History   Tobacco Use  Smoking Status Former Smoker  . Last attempt to quit: 04/22/2013  . Years since quitting: 4.7  Smokeless Tobacco Never Used     Counseling given: Not Answered   Clinical Intake:  Pre-visit preparation completed: Yes  Pain : 0-10 Pain Score: 8  Pain Type: Chronic pain Pain Location: Abdomen Pain Orientation: Mid Pain Descriptors / Indicators: Cramping, Squeezing, Radiating     Nutritional Status: BMI > 30  Obese Nutritional Risks: Nausea/ vomitting/ diarrhea(nausea due to inflammed esophagus) Diabetes: Yes(type 2) CBG done?: No Did pt. bring in CBG monitor from home?: No  How often do you need to have someone help you when you read instructions, pamphlets, or other written materials from your doctor or pharmacy?: 1 - Never  Interpreter Needed?: No  Information [facility] by :: [facility]  LPN  Past Medical History:  Diagnosis Date  . [place]   . Anemia   . Anxiety   . Asthma   . CAD (coronary artery disease)    a. 2002 CABGx2 (LIMA->LAD, VG->VG->OM1);  b. 09/2012 DES->OM;  c. 03/2015 PTCA of LAD ([facility] in setting of atretic LIMA; d. 05/2015 Cath ([facility]: nonobs dzs; e. 06/2015 Cath ([facility]): LM nl, LAD 45p/d ISR, 50d, D1/2 small, LCX 50p/d ISR, OM1 70ost, 30 ISR, VG->OM1 50ost, 165mLIMA->LAD 99p/d - atretic, RCA dom, nl; f.cath 10/16: 40-50%(FFR 0.90) pLAD, 75% (FFR 0.77) mLAD s/p PCI/DES, oRCA 40% (FFR0.95)  . Celiac disease   . Chronic diastolic CHF (congestive heart failure) ([place]   a. 06/2009 Echo: EF 60-65%, Gr 1 DD, triv AI, mildly dil LA, nl RV.  . Marland KitchenOPD (chronic obstructive pulmonary disease) ([place]   a. Chronic bronchitis and emphysema.  . Marland Kitchenysrhythmia   . Essential hypertension   . History of tobacco abuse    a. Quit 2014.  . Marland KitchenSVT (paroxysmal supraventricular tachycardia) ([place]   a. 10/2012 Noted on Zio Patch.  . Type II diabetes mellitus ([facility]   Past Surgical History:  Procedure Laterality Date  . BYPASS GRAFT    . CARDIAC CATHETERIZATION N/A 07/12/2015   rocedure: Left Heart Cath and Cors/Grafts Angiography;  Surgeon: HeBelva CromeMD;  [place];  Service: Cardiovascular;  Laterality: N/A;  . CARDIAC CATHETERIZATION  Right 10/07/2015   Procedure: Left Heart Cath and Cors/Grafts Angiography;  Surgeon: Dionisio David, MD;  Location: [place] CV LAB;  Service: Cardiovascular;  Laterality: Right;  . CARDIAC CATHETERIZATION N/A 04/06/2016   Procedure: Left Heart Cath and Coronary Angiography;  Surgeon: Yolonda Kida, MD;  Location: [place] CV LAB;  Service: Cardiovascular;  Laterality: N/A;  . CARDIAC CATHETERIZATION  04/06/2016   Procedure: Bypass Graft Angiography;  Surgeon: Yolonda Kida, MD;  Location: [place] CV LAB;  Service: Cardiovascular;;  . CARDIAC CATHETERIZATION N/A 11/02/2016   Procedure: Left Heart Cath  and Cors/Grafts Angiography and possible PCI;  Surgeon: Yolonda Kida, MD;  Location: [place] CV LAB;  Service: Cardiovascular;  Laterality: N/A;  . CARDIAC CATHETERIZATION N/A 11/02/2016   Procedure: Coronary Stent Intervention;  Surgeon: Yolonda Kida, MD;  Location: [place] CV LAB;  Service: Cardiovascular;  Laterality: N/A;  . CHOLECYSTECTOMY    . ESOPHAGEAL DILATION    . TONSILLECTOMY    . VASCULAR SURGERY     Family History  Problem Relation Age of Onset  . Heart attack Mother   . Depression Mother   . Heart disease Mother   . COPD Mother   . Hypertension Mother   . Heart attack Father   . Diabetes Father   . Depression Father   . Heart disease Father   . Cirrhosis Father   . Parkinson's disease Brother    Social History   Socioeconomic History  . Marital status: Married    Spouse name: None  . Number of children: 1  . Years of education: None  . Highest education level: 10th grade  Social Needs  . Financial resource strain: Not hard at all  . Food insecurity - worry: Never true  . Food insecurity - inability: Never true  . Transportation needs - medical: No  . Transportation needs - non-medical: No  Occupational History  . Occupation: Disabled  Tobacco Use  . Smoking status: Former Smoker    Last attempt to quit: 04/22/2013    Years since quitting: 4.7  . Smokeless tobacco: Never Used  Substance and Sexual Activity  . Alcohol use: No    Comment: remotely quit alcohol use. Hx of heavy alcohol use.  . Drug use: No  . Sexual activity: None  Other Topics Concern  . None  Social History Narrative   Pt lives in [place] with wife.  Does not routinely exercise.    Outpatient Encounter Medications as of 01/04/2018  Medication Sig  . albuterol (PROVENTIL HFA;VENTOLIN HFA) 108 (90 Base) MCG/ACT inhaler Inhale 2 puffs into the lungs every 4 (four) hours as needed for wheezing or shortness of breath. Reported on 04/08/2016  . allopurinol  (ZYLOPRIM) 100 MG tablet TAKE 3 TABLETS BY MOUTH EVERY DAY.  Marland Kitchen ALPRAZolam (XANAX) 1 MG tablet TAKE ONE-HALF TO ONE TABLET BY MOUTH THREE TIMES DAILY AS NEEDED FOR ANXIETY  . aspirin 81 MG chewable tablet Chew 81 mg by mouth daily.  Marland Kitchen atorvastatin (LIPITOR) 80 MG tablet TAKE ONE TABLET BY MOUTH EVERY NIGHT AT BEDTIME  . BANOPHEN 25 MG capsule TAKE 1 CAPSULE (25MG TOTAL) BY MOUTH EVERY 4 (FOUR) HOURS AS NEEDED. (Patient not taking: Reported on 11/01/2017)  . Blood Glucose Monitoring Suppl (ACCU-CHEK AVIVA PLUS) w/Device KIT CHECK BLOOD SUGAR EVERY DAY  . cetirizine (ZYRTEC) 10 MG tablet TAKE ONE TABLET BY MOUTH AT BEDTIME. (Patient taking differently: 1-2 tid prn)  . cyclobenzaprine (FLEXERIL) 5 MG tablet TAKE  1 TO 2 TABLETS BY MOUTH THREE TIMESDAILY AS NEEDED (BACK PAIN)  . docusate sodium (COLACE) 100 MG capsule Take 100 mg by mouth daily.   Marland Kitchen ELIQUIS 5 MG TABS tablet TAKE 1 TABLET BY MOUTH TWICE A DAY.  . famotidine (PEPCID) 40 MG tablet Take 1 tablet (40 mg total) by mouth every evening. (Patient not taking: Reported on 11/01/2017)  . fluticasone furoate-vilanterol (BREO ELLIPTA) 100-25 MCG/INH AEPB Inhale 1 puff into the lungs daily.   Marland Kitchen glucose blood (ACCU-CHEK AVIVA PLUS) test strip Use to check blood sugar once a day (Patient not taking: Reported on 11/01/2017)  . hydrocortisone 1 % ointment Apply 1 application topically 2 (two) times daily. Apply to rash (Patient not taking: Reported on 11/01/2017)  . isosorbide mononitrate (IMDUR) 60 MG 24 hr tablet Take 1 tablet (60 mg total) by mouth daily. (Patient taking differently: Take 60 mg by mouth 2 (two) times daily. )  . JARDIANCE 10 MG TABS tablet TAKE 1 TABLET BY MOUTH EVERY DAY  . lansoprazole (PREVACID) 30 MG capsule Take 30 mg by mouth 2 (two) times daily.   Marland Kitchen lisinopril (PRINIVIL,ZESTRIL) 2.5 MG tablet TAKE 1 TABLET BY MOUTH EVERY DAY  . Magnesium Oxide 400 (240 Mg) MG TABS TAKE ONE TABLET BY MOUTH DAILY  . Magnesium Oxide 400 (240 Mg) MG  TABS TAKE 1 TABLET BY MOUTH EVERY DAY (Patient not taking: Reported on 11/01/2017)  . metFORMIN (GLUCOPHAGE) 500 MG tablet TAKE 1 TABLET BY MOUTH EVERY MORNING  . nitroGLYCERIN (NITROSTAT) 0.4 MG SL tablet Place 1 tablet (0.4 mg total) under the tongue every 5 (five) minutes as needed for chest pain. Reported on 05/26/2016  . omega-3 acid ethyl esters (LOVAZA) 1 g capsule TAKE FOUR CAPSULES BY MOUTH DAILY  . ondansetron (ZOFRAN) 4 MG tablet Take 1 tablet (4 mg total) by mouth every 8 (eight) hours as needed for nausea or vomiting.  Marland Kitchen oxyCODONE-acetaminophen (PERCOCET) 10-325 MG tablet Take 1 tablet by mouth every 4 (four) hours as needed.  . pantoprazole (PROTONIX) 40 MG tablet Take 40 mg by mouth daily.  . potassium chloride SA (K-DUR,KLOR-CON) 20 MEQ tablet   . RANEXA 500 MG 12 hr tablet Take 1,000 mg by mouth 2 (two) times daily.  . sotalol (BETAPACE) 120 MG tablet Take 120 mg by mouth 2 (two) times daily.   Marland Kitchen SPIRIVA HANDIHALER 18 MCG inhalation capsule INHALE 1 CAPSULE VIA INHALER ONCE A DAY  . sucralfate (CARAFATE) 1 g tablet Take 1 tablet (1 g total) by mouth 2 (two) times daily. (Patient not taking: Reported on 11/01/2017)  . sucralfate (CARAFATE) 1 g tablet Take by mouth.  . tamsulosin (FLOMAX) 0.4 MG CAPS capsule Take by mouth.  . torsemide (DEMADEX) 100 MG tablet Take 0.5 tablets by mouth daily.  . traMADol (ULTRAM) 50 MG tablet Take 1 tablet (50 mg total) by mouth every 6 (six) hours as needed.   No facility-administered encounter medications on file as of 01/04/2018.     Activities of Daily Living In your present state of health, do you have any difficulty performing the following activities: 01/04/2018 10/01/2017  Hearing? N N  Vision? N N  Difficulty concentrating or making decisions? N N  Walking or climbing stairs? Y Y  Comment Due to COPD.  -  Dressing or bathing? N N  Doing errands, shopping? N Y  Conservation officer, nature and eating ? N -  Using the Toilet? N -  In the past six  months, have you accidently  leaked urine? N -  Do you have problems with loss of bowel control? N -  Managing your Medications? N -  Managing your Finances? N -  Housekeeping or managing your Housekeeping? N -  Comment - -  Some recent data might be hidden    Patient Care Team: Birdie Sons, MD as PCP - General (Family Medicine) Allyne Gee, MD as Consulting Physician (Pulmonary Disease) Corey Skains, MD as Consulting Physician (Cardiology) Roderick Pee as Physician Assistant Anell Barr, OD as Consulting Physician (Optometry)   Assessment:   This is a routine wellness examination for Lawrence [name].  Exercise Activities and Dietary recommendations Current Exercise Habits: The patient does not participate in regular exercise at present, Exercise limited by: None identified  Goals    . DIET - REDUCE PORTION SIZE     Recommend eating 3 small meals a day with two healthy snacks in between.        Fall Risk Fall Risk  01/04/2018 06/02/2017 05/31/2017 03/24/2017 03/10/2017  Falls in the past year? Yes Yes No Yes Yes  Comment - - - - -  Number falls in past yr: 1 1 - 1 1  Comment tripped over cat - - - -  Injury with Fall? No No - No No  Risk for fall due to : - History of fall(s) - - History of fall(s)  Follow up Falls prevention discussed - - - Falls prevention discussed   Is the patient's home free of loose throw rugs in walkways, pet beds, electrical cords, etc?   yes      Grab bars in the bathroom? yes      Handrails on the stairs?   yes      Adequate lighting?   yes  Timed Get Up and Go Performed: N/A  Depression Screen PHQ 2/9 Scores 01/04/2018 06/02/2017 05/31/2017 03/24/2017  PHQ - 2 Score 0 0 0 0  PHQ- 9 Score - - - -    Cognitive Function: Pt declined screening today.      6CIT Screen 10/27/2016  What Year? 0 points  What month? 0 points  What time? 0 points  Count back from 20 0 points  Months in reverse 4 points  Repeat phrase 4 points    Total Score 8    Immunization History  Administered Date(s) Administered  . Influenza,inj,Quad PF,6+ Mos 08/02/2015, 07/17/2016, 08/13/2017  . Pneumococcal Polysaccharide-23 05/06/2004, 08/22/2015  . Td 10/28/2009, 08/31/2017  . Tdap 05/13/2011  . Zoster Recombinat (Shingrix) 08/13/2017    Qualifies for Shingles Vaccine? Shingrix #1 completed. Pt to receive the second Shingrix at [facility].   Screening Tests Health Maintenance  Topic Date Due  . OPHTHALMOLOGY EXAM  01/07/1966  . HEMOGLOBIN A1C  03/01/2018  . FOOT EXAM  08/31/2018  . COLONOSCOPY  03/10/2020  . TETANUS/TDAP  09/01/2027  . PNEUMOCOCCAL POLYSACCHARIDE VACCINE  Completed  . INFLUENZA VACCINE  Completed  . Hepatitis C Screening  Completed  . HIV Screening  Completed   Cancer Screenings: Lung: Low Dose CT Chest recommended if Age 21-80 years, 30 pack-year currently smoking OR have quit w/in 15years. Patient does qualify, however pt states he has had this completed in the past. Colorectal: Up to date  Additional Screenings:  HIV: Up to date Hepatitis C Screening: Up to date    Plan:  I have personally reviewed and addressed the Medicare Annual Wellness questionnaire and have noted the following in the patient's chart:  A.  Medical and social history B. Use of alcohol, tobacco or illicit drugs  C. Current medications and supplements D. Functional ability and status E.  Nutritional status F.  Physical activity G. Advance directives H. List of other physicians I.  Hospitalizations, surgeries, and ER visits in previous 12 months J.  [place] such as hearing and vision if needed, cognitive and depression L. Referrals and appointments - none  In addition, I have reviewed and discussed with patient certain preventive protocols, quality metrics, and best practice recommendations. A written personalized care plan for preventive services as well as general preventive health recommendations were  provided to patient.  See attached scanned questionnaire for additional information.   Signed,  Fabio Neighbors, LPN Nurse Health Advisor   Nurse Recommendations: Pt states he has had an eye exam in the last year. Record release completed to retrieve OV notes from Dr Ellin Mayhew. Will update HM once received.

## 2018-01-07 IMAGING — CR DG CHEST 2V
1 series · 2 of 2 positions shown · non-contrast
Comparison: 01/23/2016

CLINICAL DATA: Worsening shortness of breath, chest pain

EXAM:
CHEST  2 VIEW

[Series 1: dg chest 2 view · 0.14mm/px · 2 of 2 slices shown]
[im 1/2]
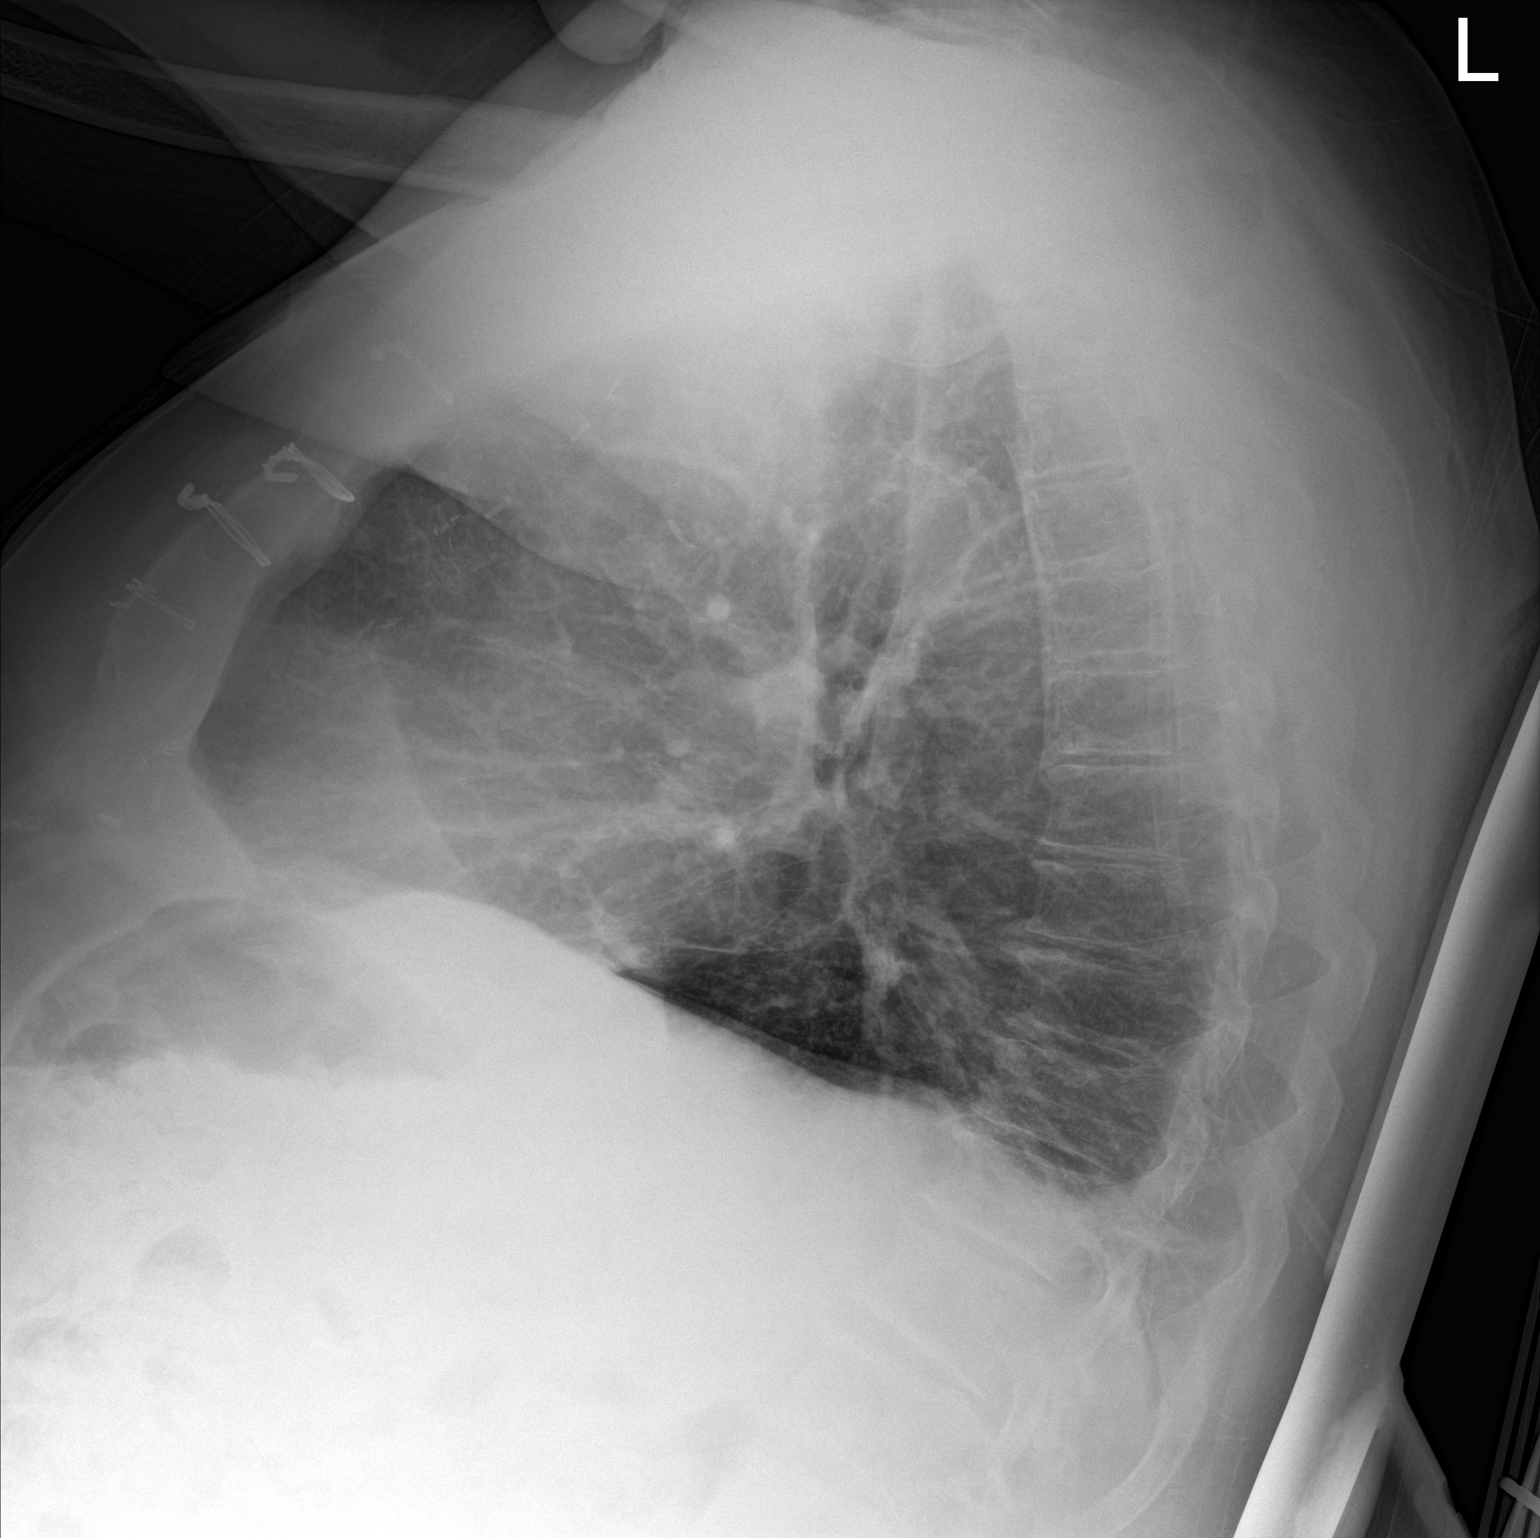
[im 2/2]
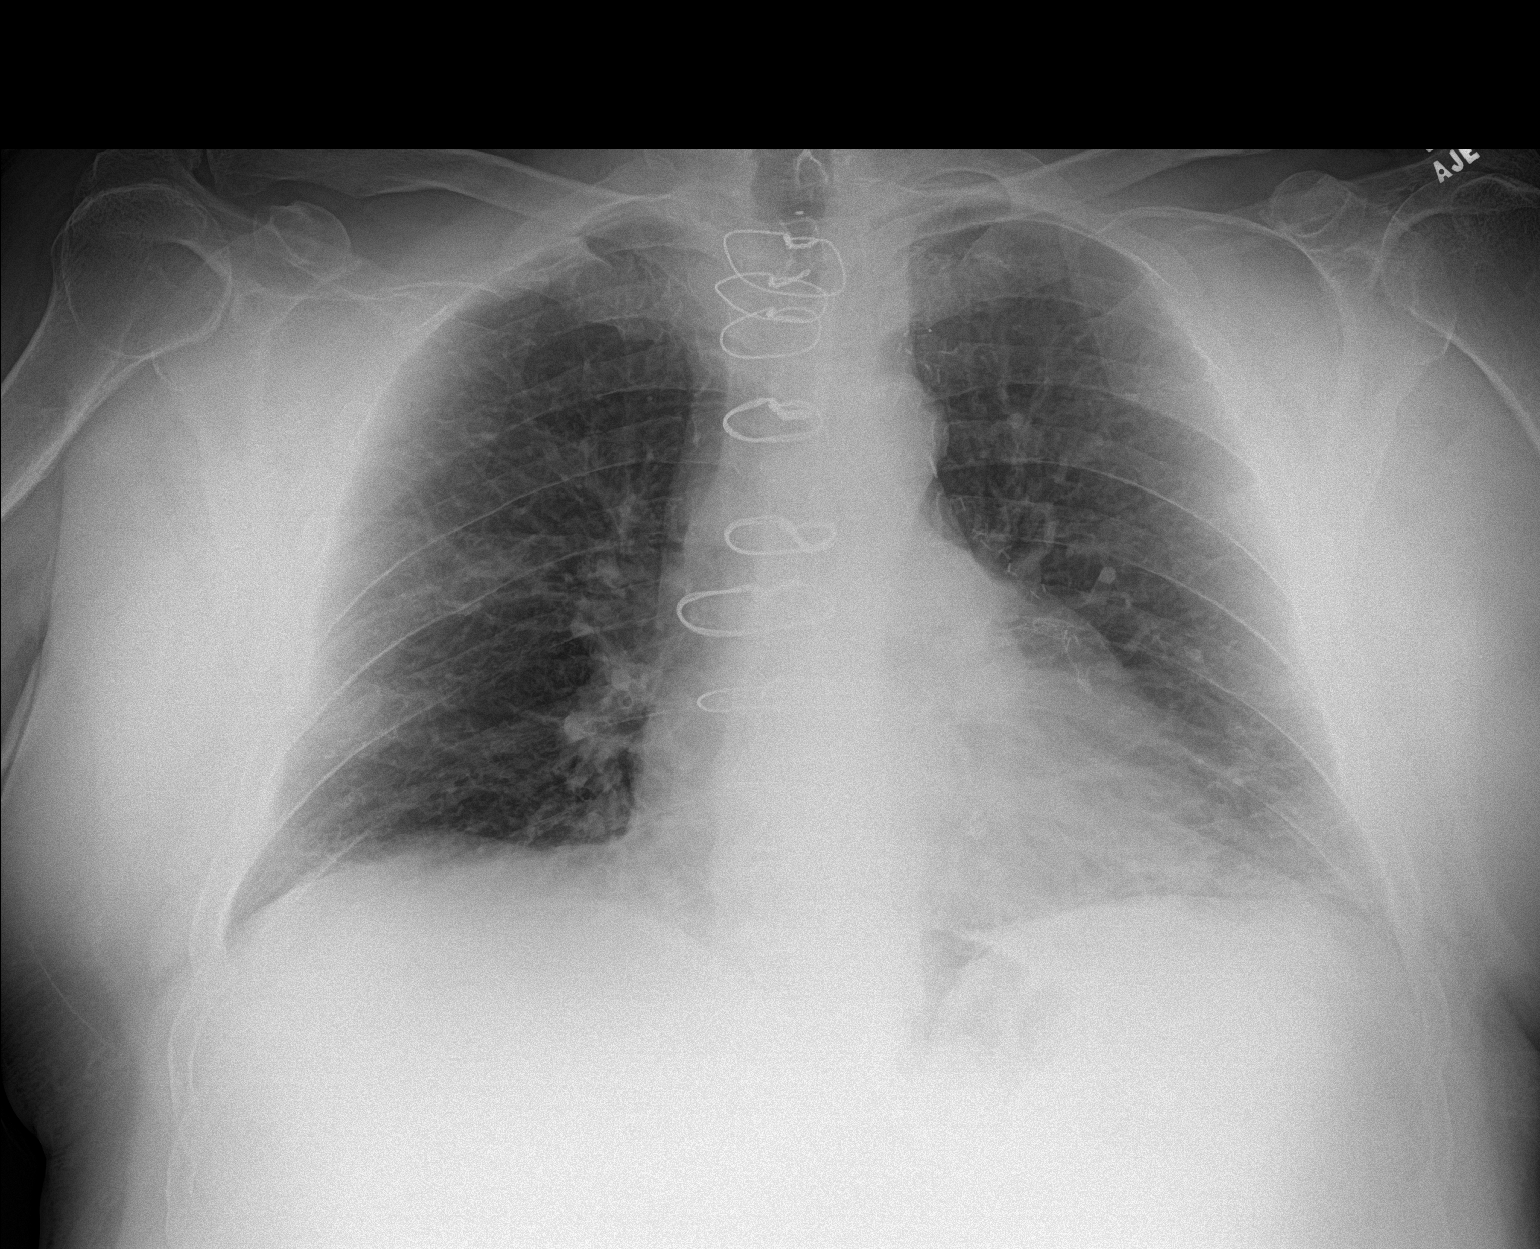

[2 of 2 positions shown; findings below may reference images not displayed]

FINDINGS: Increased density noted posteriorly on the lateral view, likely at
the left lung base which could represent atelectasis or infiltrate.
Heart is normal size. Right lung is clear. Prior CABG. No effusions.
No acute bony abnormality.
IMPRESSION: Patchy left lower lobe airspace disease which could represent
atelectasis or pneumonia.

## 2018-01-10 DIAGNOSIS — E1159 Type 2 diabetes mellitus with other circulatory complications: Secondary | ICD-10-CM | POA: Diagnosis not present

## 2018-01-10 DIAGNOSIS — E785 Hyperlipidemia, unspecified: Secondary | ICD-10-CM | POA: Diagnosis not present

## 2018-01-10 DIAGNOSIS — Z125 Encounter for screening for malignant neoplasm of prostate: Secondary | ICD-10-CM | POA: Diagnosis not present

## 2018-01-11 LAB — HEMOGLOBIN A1C
Est. average glucose Bld gHb Est-mCnc: 148 mg/dL
Hgb A1c MFr Bld: 6.8 % — ABNORMAL HIGH (ref 4.8–5.6)

## 2018-01-11 LAB — LIPID PANEL
Chol/HDL Ratio: 4 ratio (ref 0.0–5.0)
Cholesterol, Total: 151 mg/dL (ref 100–199)
HDL: 38 mg/dL — ABNORMAL LOW (ref >39)
LDL Calculated: 56 mg/dL (ref 0–99)
Triglycerides: 286 mg/dL — ABNORMAL HIGH (ref 0–149)
VLDL Cholesterol Cal: 57 mg/dL — ABNORMAL HIGH (ref 5–40)

## 2018-01-11 LAB — PSA: Prostate Specific Ag, Serum: 1.3 ng/mL (ref 0.0–4.0)

## 2018-01-12 DIAGNOSIS — I10 Essential (primary) hypertension: Secondary | ICD-10-CM | POA: Diagnosis not present

## 2018-01-12 DIAGNOSIS — J439 Emphysema, unspecified: Secondary | ICD-10-CM | POA: Diagnosis not present

## 2018-01-12 DIAGNOSIS — J449 Chronic obstructive pulmonary disease, unspecified: Secondary | ICD-10-CM | POA: Diagnosis not present

## 2018-01-12 DIAGNOSIS — G4710 Hypersomnia, unspecified: Secondary | ICD-10-CM | POA: Diagnosis not present

## 2018-01-13 ENCOUNTER — Telehealth: Payer: Self-pay | Admitting: Family Medicine

## 2018-01-13 IMAGING — CT CT CHEST W/ CM
1 series · 15 of 34 positions shown, 19 images · IV contrast (iopamidol)
Comparison: 02/27/2016 CTA chest

CLINICAL DATA: Bibasilar atelectasis, followup abnormal CTA chest,
still short of breath after antibiotics

EXAM:
CT CHEST WITH CONTRAST
TECHNIQUE: Multidetector CT imaging of the chest was performed during
intravenous contrast administration. Sagittal and coronal MPR images
reconstructed from axial data set.
CONTRAST:  75mL BADCIK-JJJ IOPAMIDOL (BADCIK-JJJ) INJECTION 61% IV

[Series 2: axial st · axial · 0.78mm/px · z∈[-658,-428]mm · 15 of 137 slices shown, 19 images]
[im 11/137  mediastinal]
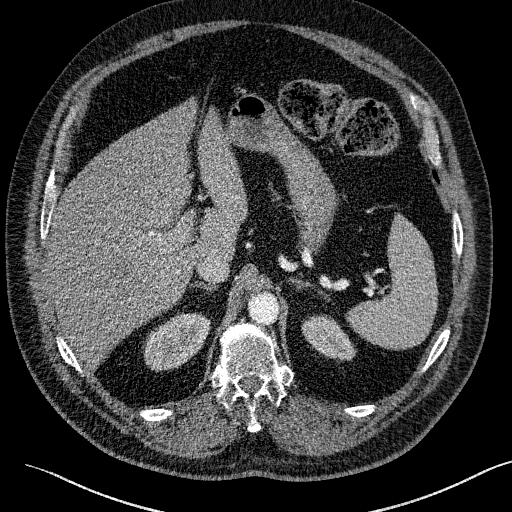
[im 11/137  lung]
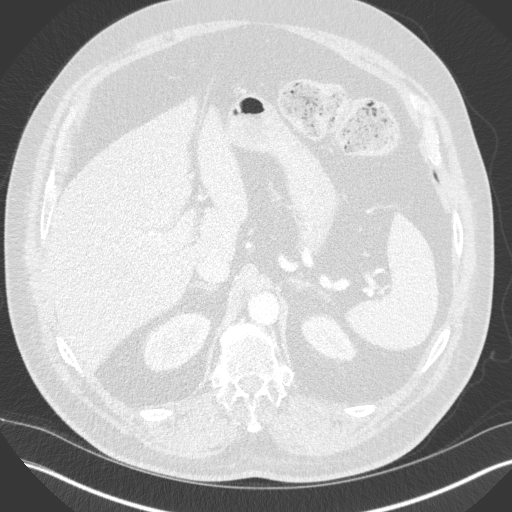
[im 21/137  lung]
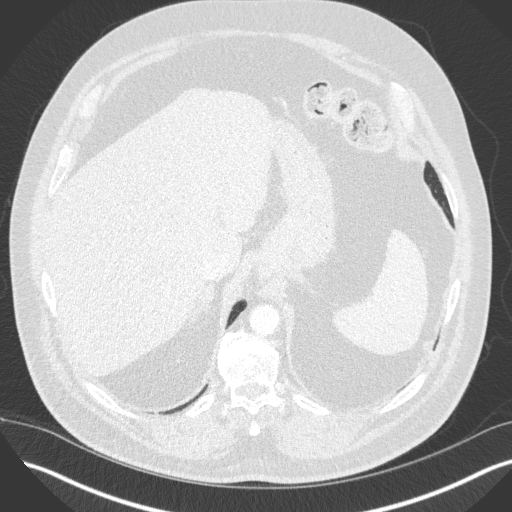
[im 28/137  lung]
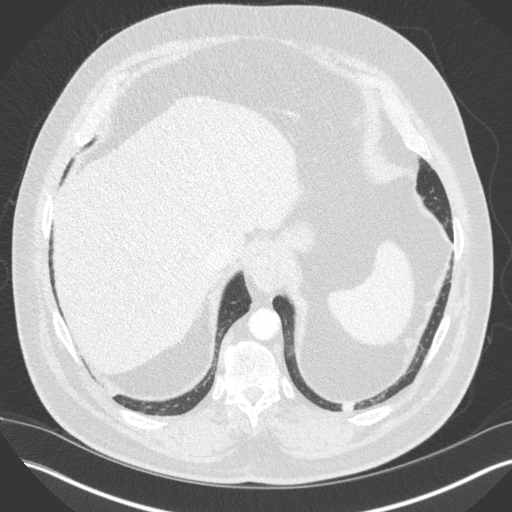
[im 36/137  lung]
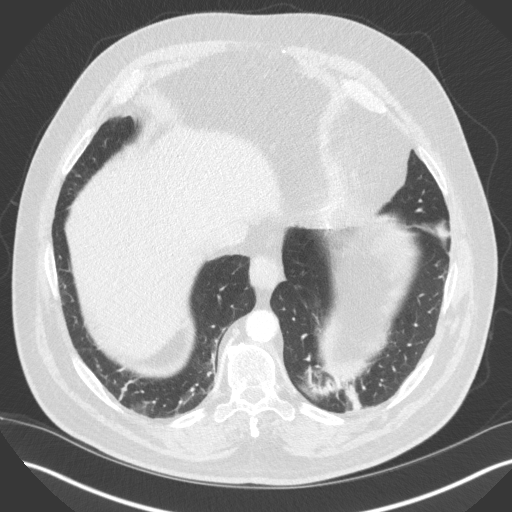
[im 46/137  mediastinal]
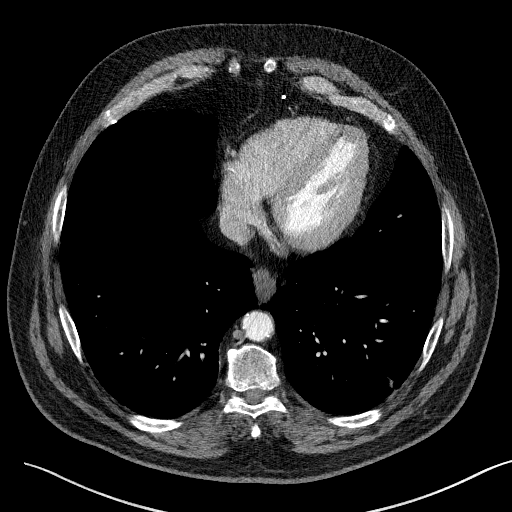
[im 46/137  lung]
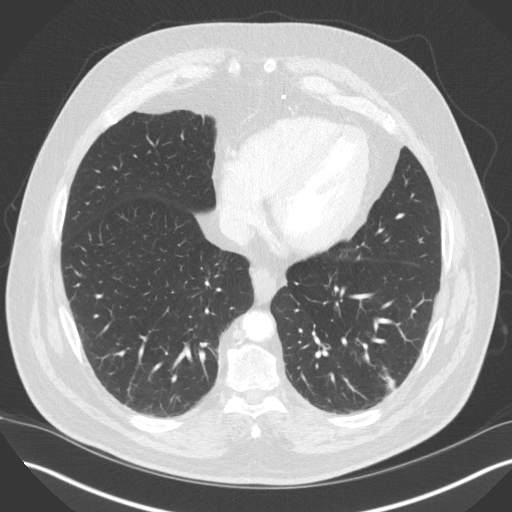
[im 55/137  lung]
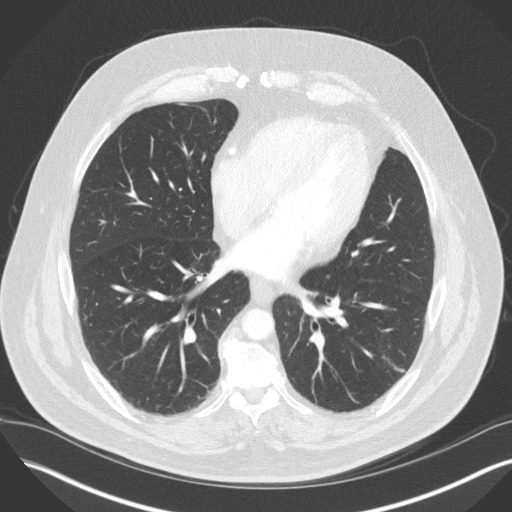
[im 61/137  lung]
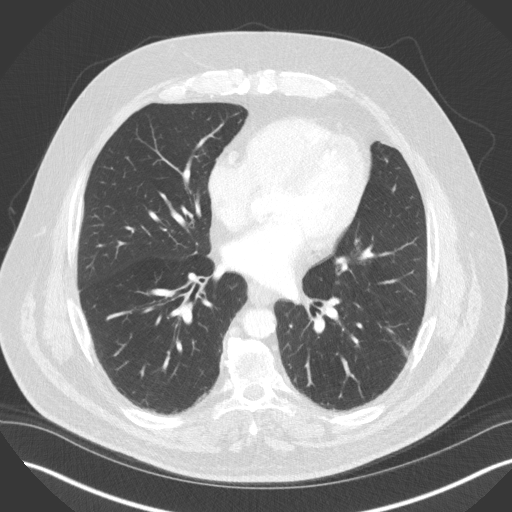
[im 71/137  lung]
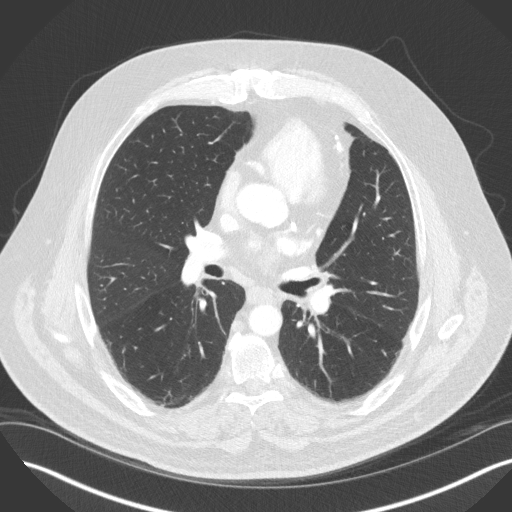
[im 76/137  mediastinal]
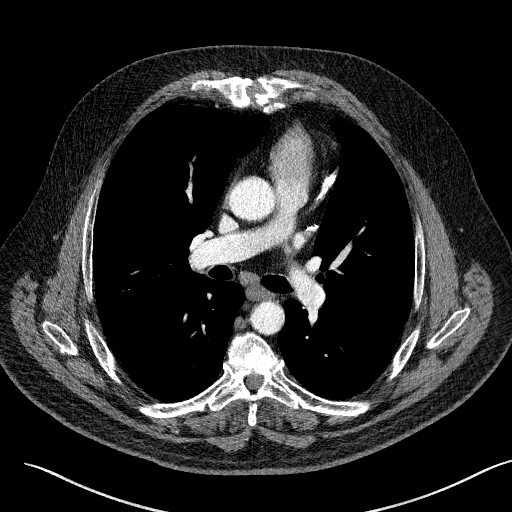
[im 76/137  lung]
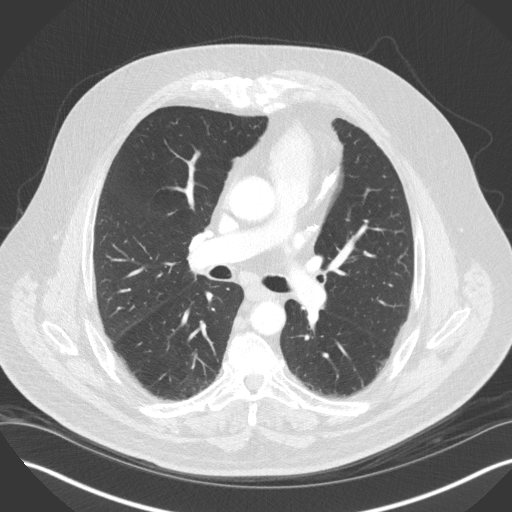
[im 82/137  lung]
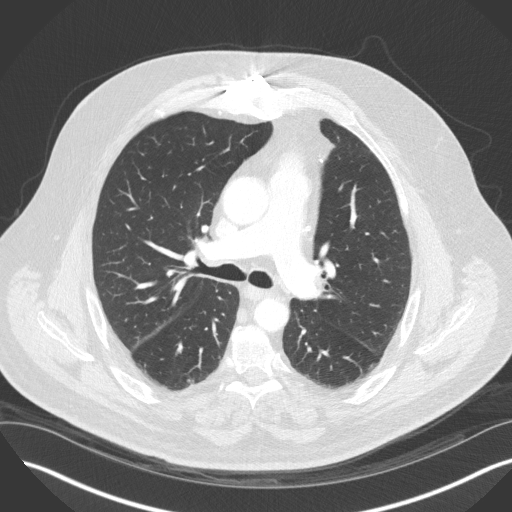
[im 91/137  lung]
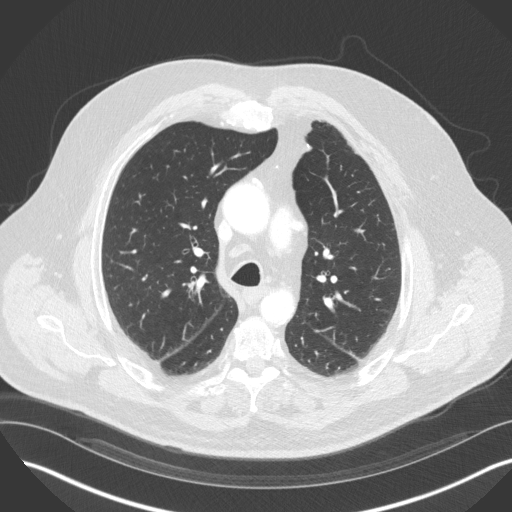
[im 101/137  lung]
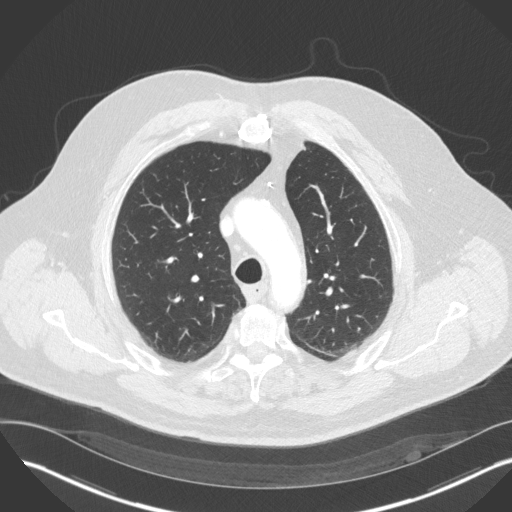
[im 109/137  mediastinal]
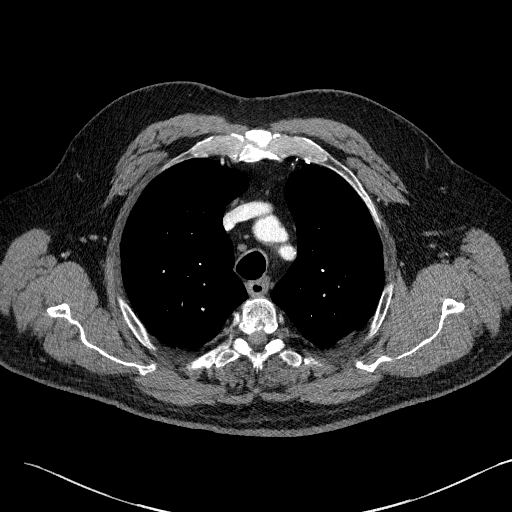
[im 109/137  lung]
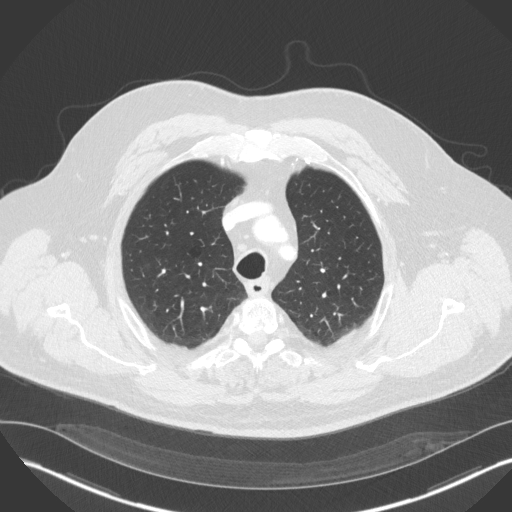
[im 116/137  lung]
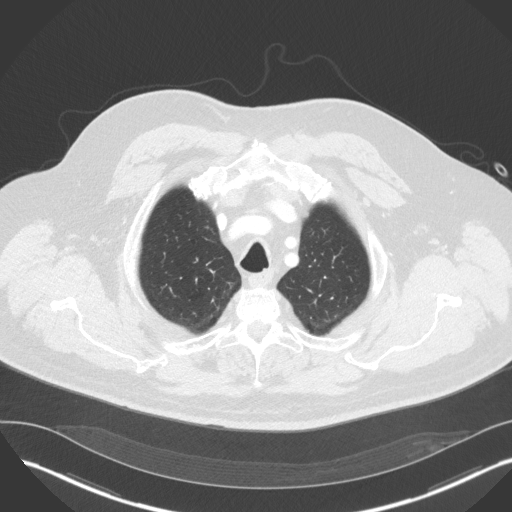
[im 126/137  lung]
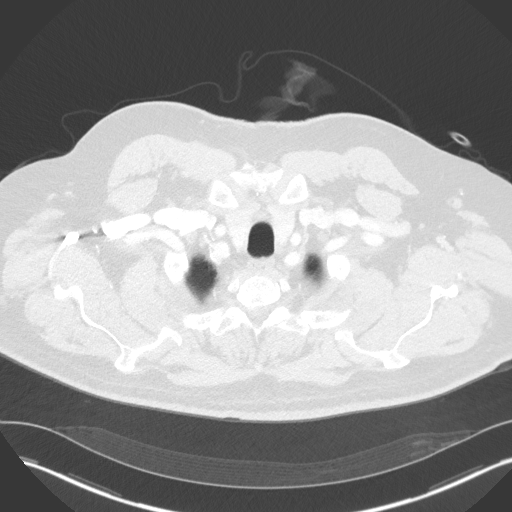

[15 of 34 positions shown; findings below may reference images not displayed]

FINDINGS: Scattered atherosclerotic calcifications aorta, proximal great
vessels and particularly coronary arteries.

Aorta normal caliber without gross aneurysm or dissection.

Scattered normal size mediastinal and axillary lymph nodes without
definite thoracic adenopathy.

Gallbladder surgically absent.

Visualized upper abdomen unremarkable.

Mild linear subsegmental atelectasis in both lower lobes.

Lungs otherwise clear.

No pulmonary infiltrate, pleural effusion or pneumothorax.

No definite mass/nodule.

Osseous structures unremarkable.
IMPRESSION: Mild residual bibasilar atelectasis.

Otherwise negative exam.

Extensive atherosclerotic calcification, particularly within
coronary arteries.

## 2018-01-13 NOTE — Telephone Encounter (Signed)
Patient was notified of results. Expressed understanding.

## 2018-01-13 NOTE — Telephone Encounter (Signed)
Pt request call back to get lab results. Please advise. Thanks TNP

## 2018-01-18 ENCOUNTER — Other Ambulatory Visit: Payer: Self-pay

## 2018-01-18 MED ORDER — FLUTICASONE FUROATE-VILANTEROL 100-25 MCG/INH IN AEPB: 1.0000 | INHALATION_SPRAY | Freq: Every day | RESPIRATORY_TRACT | 2 refills | Status: DC

## 2018-01-20 ENCOUNTER — Encounter: Payer: Self-pay | Admitting: *Deleted

## 2018-01-24 ENCOUNTER — Encounter: Payer: Self-pay | Admitting: Family Medicine

## 2018-01-24 ENCOUNTER — Ambulatory Visit (INDEPENDENT_AMBULATORY_CARE_PROVIDER_SITE_OTHER): Payer: Medicare HMO | Admitting: Family Medicine

## 2018-01-24 VITALS — BP 144/80 | HR 73 | Temp 98.7°F | Resp 18

## 2018-01-24 DIAGNOSIS — R110 Nausea: Secondary | ICD-10-CM | POA: Diagnosis not present

## 2018-01-24 DIAGNOSIS — J441 Chronic obstructive pulmonary disease with (acute) exacerbation: Secondary | ICD-10-CM

## 2018-01-24 MED ORDER — LEVOFLOXACIN 750 MG PO TABS: 750.0000 mg | ORAL_TABLET | Freq: Every day | ORAL | 0 refills | Status: AC

## 2018-01-24 MED ORDER — PROMETHAZINE HCL 25 MG PO TABS: 25.0000 mg | ORAL_TABLET | Freq: Three times a day (TID) | ORAL | 0 refills | Status: DC | PRN

## 2018-01-24 MED ORDER — PREDNISONE 20 MG PO TABS: 20.0000 mg | ORAL_TABLET | Freq: Every day | ORAL | 0 refills | Status: DC

## 2018-01-24 MED ORDER — OXYCODONE-ACETAMINOPHEN 10-325 MG PO TABS: 1.0000 | ORAL_TABLET | ORAL | 0 refills | Status: DC | PRN

## 2018-01-24 MED ORDER — PREDNISONE 20 MG PO TABS: 20.0000 mg | ORAL_TABLET | Freq: Two times a day (BID) | ORAL | 0 refills | Status: AC

## 2018-01-24 NOTE — Progress Notes (Signed)
Patient: Lawrence [name] Male    DOB: 1956-06-14   62 y.o.   MRN: [id number] Visit Date: 01/24/2018  Today's Provider: [name], MD   Chief Complaint  Patient presents with  . Cough    x 1 day   Subjective:    Cough  This is a new problem. The current episode started yesterday. The problem has been gradually worsening. The cough is productive of sputum (greenish yellow colored). Associated symptoms include chest pain, chills, headaches, myalgias, nasal congestion, rhinorrhea, shortness of breath and wheezing. Pertinent negatives include no ear congestion, ear pain, fever, postnasal drip, sore throat or sweats. Treatments tried: Nebulizer treatment and prescription inhalers   Used albuterol last night which helped breathing, but kept him up all night.      Allergies  Allergen Reactions  . Demerol [Meperidine] Hives  . Prednisone Other (See Comments) and Hypertension    Pt states that this medication puts him in A-fib   . Sulfa Antibiotics Hives  . Albuterol Sulfate [Albuterol] Palpitations and Other (See Comments)    Pt currently uses this medication.    . Morphine Sulfate Nausea And Vomiting, Rash and Other (See Comments)    Pt states that he is only allergic to the tablet form of this medication.       Current Outpatient Medications:  .  albuterol (PROVENTIL HFA;VENTOLIN HFA) 108 (90 Base) MCG/ACT inhaler, Inhale 2 puffs into the lungs every 4 (four) hours as needed for wheezing or shortness of breath. Reported on 04/08/2016, Disp: 1 Inhaler, Rfl: 5 .  albuterol (PROVENTIL) (2.5 MG/3ML) 0.083% nebulizer solution, Take 3 mLs (2.5 mg total) by nebulization every 6 (six) hours as needed for wheezing or shortness of breath., Disp: 150 mL, Rfl: 1 .  allopurinol (ZYLOPRIM) 100 MG tablet, TAKE 3 TABLETS BY MOUTH EVERY DAY., Disp: 90 tablet, Rfl: 4 .  ALPRAZolam (XANAX) 1 MG tablet, TAKE ONE-HALF TO ONE TABLET BY MOUTH THREE TIMES DAILY AS NEEDED FOR ANXIETY, Disp: 90 tablet,  Rfl: 5 .  aspirin 81 MG chewable tablet, Chew 81 mg by mouth daily., Disp: , Rfl:  .  atorvastatin (LIPITOR) 80 MG tablet, TAKE ONE TABLET BY MOUTH EVERY NIGHT AT BEDTIME, Disp: 90 tablet, Rfl: 4 .  BANOPHEN 25 MG capsule, TAKE 1 CAPSULE (25MG TOTAL) BY MOUTH EVERY 4 (FOUR) HOURS AS NEEDED. (Patient not taking: Reported on 11/01/2017), Disp: 30 capsule, Rfl: 5 .  Blood Glucose Monitoring Suppl (ACCU-CHEK AVIVA PLUS) w/Device KIT, CHECK BLOOD SUGAR EVERY DAY, Disp: 1 kit, Rfl: 0 .  cetirizine (ZYRTEC) 10 MG tablet, TAKE ONE TABLET BY MOUTH AT BEDTIME. (Patient taking differently: 1-2 tid prn), Disp: 30 tablet, Rfl: 11 .  cyclobenzaprine (FLEXERIL) 5 MG tablet, TAKE 1 TO 2 TABLETS BY MOUTH THREE TIMESDAILY AS NEEDED (BACK PAIN), Disp: 60 tablet, Rfl: 5 .  docusate sodium (COLACE) 100 MG capsule, Take 100 mg by mouth daily. , Disp: , Rfl:  .  ELIQUIS 5 MG TABS tablet, TAKE 1 TABLET BY MOUTH TWICE A DAY., Disp: 60 tablet, Rfl: 12 .  fluticasone furoate-vilanterol (BREO ELLIPTA) 100-25 MCG/INH AEPB, Inhale 1 puff into the lungs daily., Disp: 1 each, Rfl: 2 .  glucose blood (ACCU-CHEK AVIVA PLUS) test strip, Use to check blood sugar once a day (Patient not taking: Reported on 11/01/2017), Disp: 100 each, Rfl: 4 .  hydrocortisone 1 % ointment, Apply 1 application topically 2 (two) times daily. Apply to rash (Patient not taking: Reported on  11/01/2017), Disp: 30 g, Rfl: 0 .  isosorbide mononitrate (IMDUR) 60 MG 24 hr tablet, Take 1 tablet (60 mg total) by mouth daily. (Patient taking differently: Take 60 mg by mouth 2 (two) times daily. ), Disp: 60 tablet, Rfl: 1 .  JARDIANCE 10 MG TABS tablet, TAKE 1 TABLET BY MOUTH EVERY DAY, Disp: 30 tablet, Rfl: 12 .  lansoprazole (PREVACID) 30 MG capsule, Take 30 mg by mouth 2 (two) times daily. , Disp: , Rfl:  .  lisinopril (PRINIVIL,ZESTRIL) 2.5 MG tablet, TAKE 1 TABLET BY MOUTH EVERY DAY, Disp: 30 tablet, Rfl: 5 .  Magnesium Oxide 400 (240 Mg) MG TABS, TAKE ONE  TABLET BY MOUTH DAILY, Disp: 30 tablet, Rfl: 12 .  metFORMIN (GLUCOPHAGE) 500 MG tablet, TAKE 1 TABLET BY MOUTH EVERY MORNING, Disp: 90 tablet, Rfl: 4 .  nitroGLYCERIN (NITROSTAT) 0.4 MG SL tablet, Place 1 tablet (0.4 mg total) under the tongue every 5 (five) minutes as needed for chest pain. Reported on 05/26/2016, Disp: 30 tablet, Rfl: 1 .  omega-3 acid ethyl esters (LOVAZA) 1 g capsule, TAKE FOUR CAPSULES BY MOUTH DAILY, Disp: 120 capsule, Rfl: 12 .  ondansetron (ZOFRAN) 4 MG tablet, Take 1 tablet (4 mg total) by mouth every 8 (eight) hours as needed for nausea or vomiting., Disp: 8 tablet, Rfl: 0 .  oxyCODONE-acetaminophen (PERCOCET) 10-325 MG tablet, Take 1 tablet by mouth every 4 (four) hours as needed., Disp: 150 tablet, Rfl: 0 .  pantoprazole (PROTONIX) 40 MG tablet, Take 40 mg by mouth daily., Disp: , Rfl:  .  potassium chloride SA (K-DUR,KLOR-CON) 20 MEQ tablet, , Disp: , Rfl:  .  RANEXA 500 MG 12 hr tablet, Take 1,000 mg by mouth 2 (two) times daily., Disp: , Rfl:  .  sotalol (BETAPACE) 120 MG tablet, Take 120 mg by mouth 2 (two) times daily. , Disp: , Rfl:  .  SPIRIVA HANDIHALER 18 MCG inhalation capsule, INHALE 1 CAPSULE VIA INHALER ONCE A DAY, Disp: 30 capsule, Rfl: 12 .  sucralfate (CARAFATE) 1 g tablet, Take 1 tablet (1 g total) by mouth 2 (two) times daily., Disp: 60 tablet, Rfl: 0 .  tamsulosin (FLOMAX) 0.4 MG CAPS capsule, Take by mouth., Disp: , Rfl:  .  torsemide (DEMADEX) 100 MG tablet, Take 0.5 tablets by mouth daily., Disp: , Rfl:  .  traMADol (ULTRAM) 50 MG tablet, Take 1 tablet (50 mg total) by mouth every 6 (six) hours as needed., Disp: 20 tablet, Rfl: 0  Review of Systems  Constitutional: Positive for chills. Negative for appetite change and fever.  HENT: Positive for congestion and rhinorrhea. Negative for ear pain, postnasal drip and sore throat.   Respiratory: Positive for cough, shortness of breath and wheezing. Negative for chest tightness.   Cardiovascular:  Positive for chest pain. Negative for palpitations.  Gastrointestinal: Positive for nausea. Negative for abdominal pain and vomiting.  Musculoskeletal: Positive for myalgias.  Neurological: Positive for headaches.    Social History   Tobacco Use  . Smoking status: Former Smoker    Last attempt to quit: 04/22/2013    Years since quitting: 4.7  . Smokeless tobacco: Never Used  Substance Use Topics  . Alcohol use: No    Comment: remotely quit alcohol use. Hx of heavy alcohol use.   Objective:   BP (!) 144/80 (BP Location: Left Arm, Patient Position: Sitting, Cuff Size: Large)   Pulse 73   Temp 98.7 F (37.1 C) (Oral)   Resp 18   SpO2 96%  Comment: 2 L 02 nasal canula    Physical Exam  General Appearance:    Alert, cooperative, no distress  HENT:   bilateral TM normal without fluid or infection, bilateral TM red, dull, bulging, bilateral TM fluid noted, neck without nodes and post nasal drip noted  Eyes:    PERRL, conjunctiva/corneas clear, EOM's intact       Lungs:     Occasional expiratory wheeze, no rales,  respirations unlabored  Heart:    Regular rate and rhythm  Neurologic:   Awake, alert, oriented x 3. No apparent focal neurological           defect.           Assessment & Plan:     1. COPD exacerbation (HCC)  - levofloxacin (LEVAQUIN) 750 MG tablet; Take 1 tablet (750 mg total) by mouth daily for 7 days.  Dispense: 7 tablet; Refill: 0 - predniSONE (DELTASONE) 20 MG tablet; Take 1 tablet (20 mg total) by mouth daily with breakfast for 5 days.  Dispense: 10 tablet; Refill: 0 Call if symptoms change or if not rapidly improving.     He is due for refill - oxyCODONE-acetaminophen (PERCOCET) 10-325 MG tablet; Take 1 tablet by mouth every 4 (four) hours as needed.  Dispense: 150 tablet; Refill: 0       [name], MD  [facility]

## 2018-01-24 NOTE — Addendum Note (Signed)
Addended by: Birdie Sons on: 01/24/2018 02:57 PM   Modules accepted: Orders

## 2018-01-28 IMAGING — CR DG CHEST 2V
1 series · 2 of 2 positions shown · non-contrast
Comparison: 2291 pain

CLINICAL DATA: Shortness of breath on exertion beginning 2 days
ago, worse today. History of COPD on oxygen.

EXAM:
CHEST  2 VIEW

[Series 1: w chest pa · 0.14mm/px · 2 of 2 slices shown]
[im 1/2]
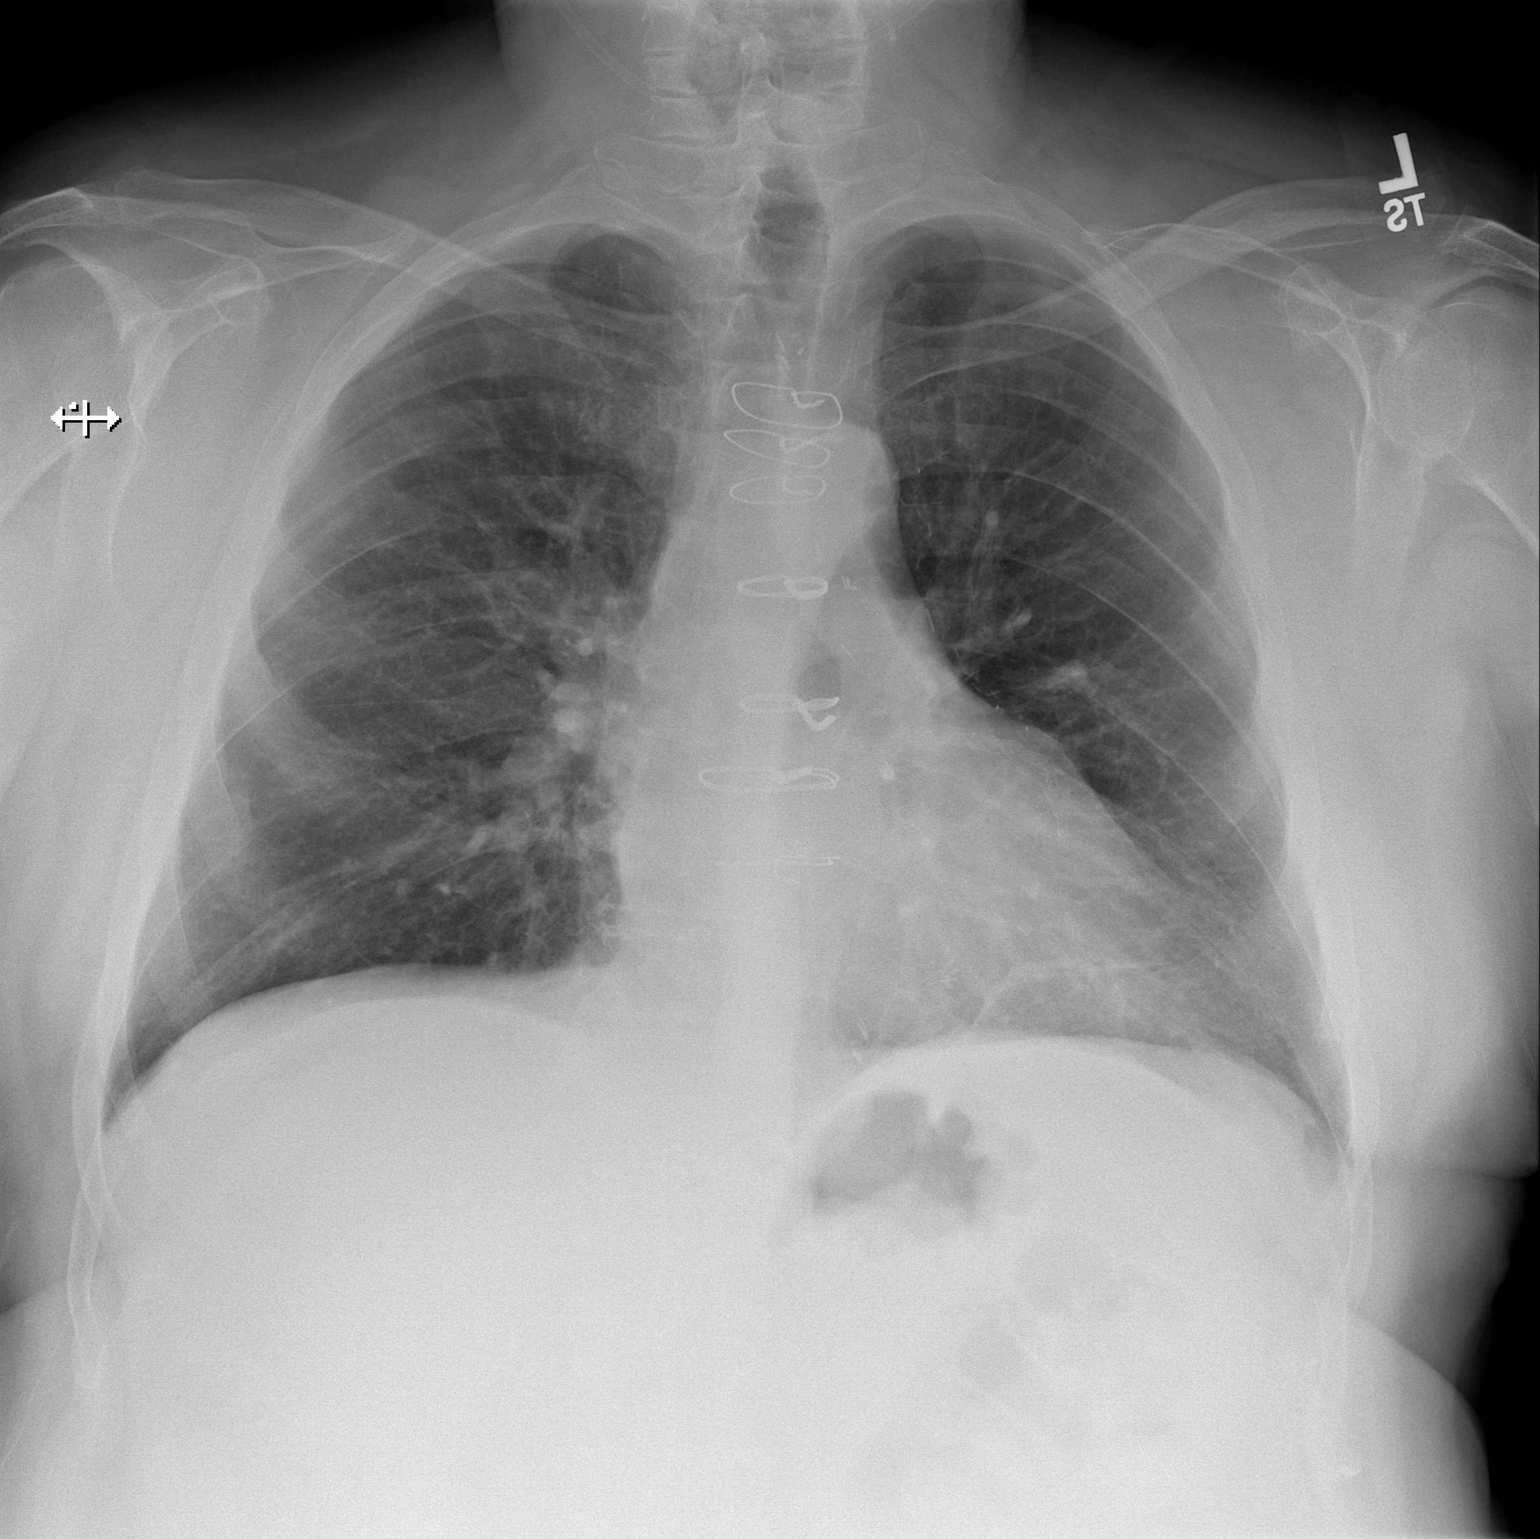
[im 2/2]
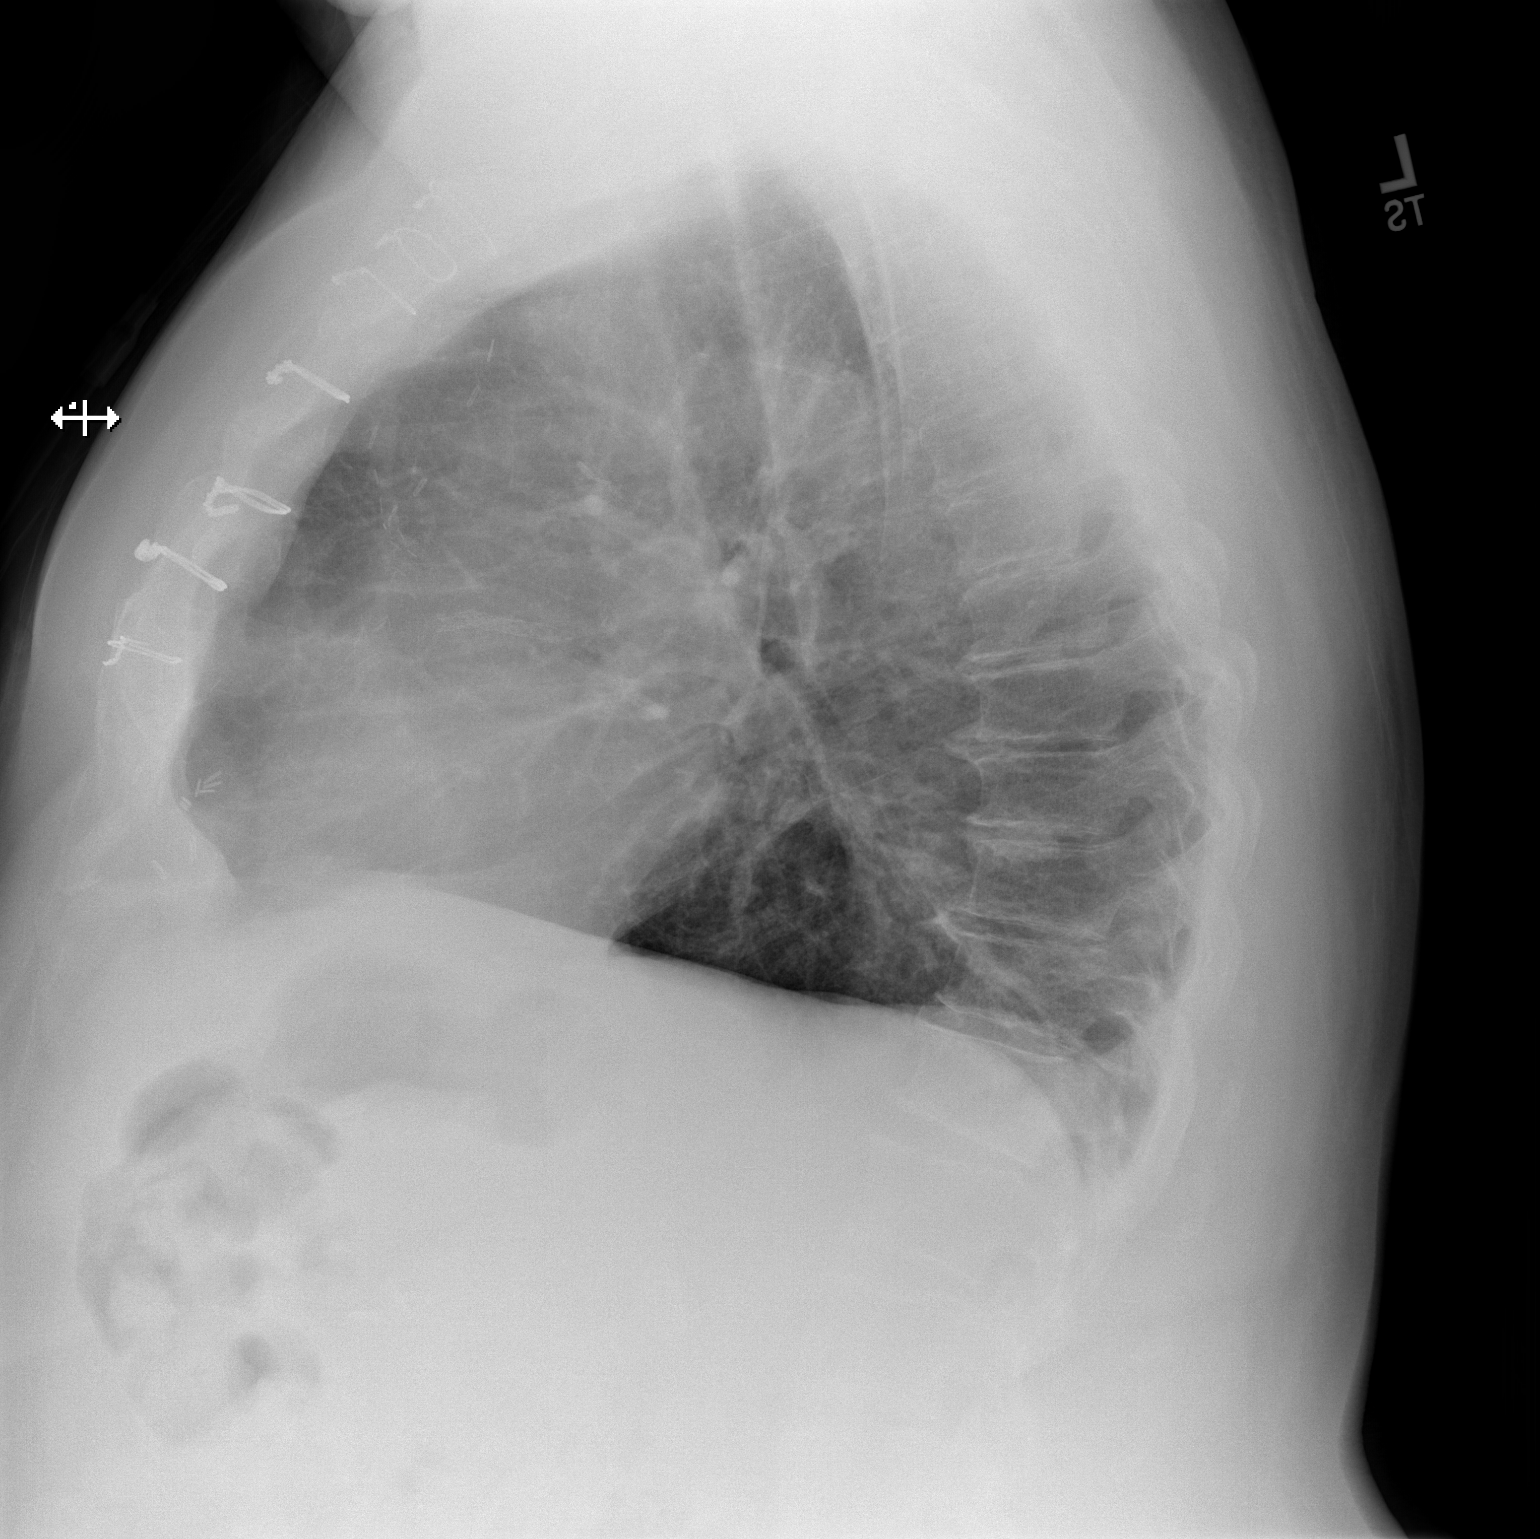

[2 of 2 positions shown; findings below may reference images not displayed]

FINDINGS: Postoperative changes in the mediastinum. Normal heart size and
pulmonary vascularity. No focal airspace disease or consolidation in
the lungs. No blunting of costophrenic angles. No pneumothorax.
Mediastinal contours appear intact. Linear atelectasis or scarring
in the left lung base. Degenerative changes in the spine.
IMPRESSION: Linear atelectasis or fibrosis in the left lung base. No evidence of
active consolidation.

## 2018-02-02 DIAGNOSIS — Z862 Personal history of diseases of the blood and blood-forming organs and certain disorders involving the immune mechanism: Secondary | ICD-10-CM | POA: Diagnosis not present

## 2018-02-02 DIAGNOSIS — R1013 Epigastric pain: Secondary | ICD-10-CM | POA: Diagnosis not present

## 2018-02-02 DIAGNOSIS — R194 Change in bowel habit: Secondary | ICD-10-CM | POA: Diagnosis not present

## 2018-02-02 DIAGNOSIS — G8929 Other chronic pain: Secondary | ICD-10-CM | POA: Diagnosis not present

## 2018-02-02 DIAGNOSIS — R1012 Left upper quadrant pain: Secondary | ICD-10-CM | POA: Diagnosis not present

## 2018-02-02 DIAGNOSIS — R110 Nausea: Secondary | ICD-10-CM | POA: Diagnosis not present

## 2018-02-02 DIAGNOSIS — R1032 Left lower quadrant pain: Secondary | ICD-10-CM | POA: Diagnosis not present

## 2018-02-03 ENCOUNTER — Other Ambulatory Visit: Payer: Self-pay | Admitting: Family Medicine

## 2018-02-05 IMAGING — CR DG CHEST 2V
2 series · 2 of 2 positions shown · non-contrast
Comparison: March 18, 2016

CLINICAL DATA: Chest pain and shortness of breath for 3 weeks

EXAM:
CHEST  2 VIEW

[chest pa]
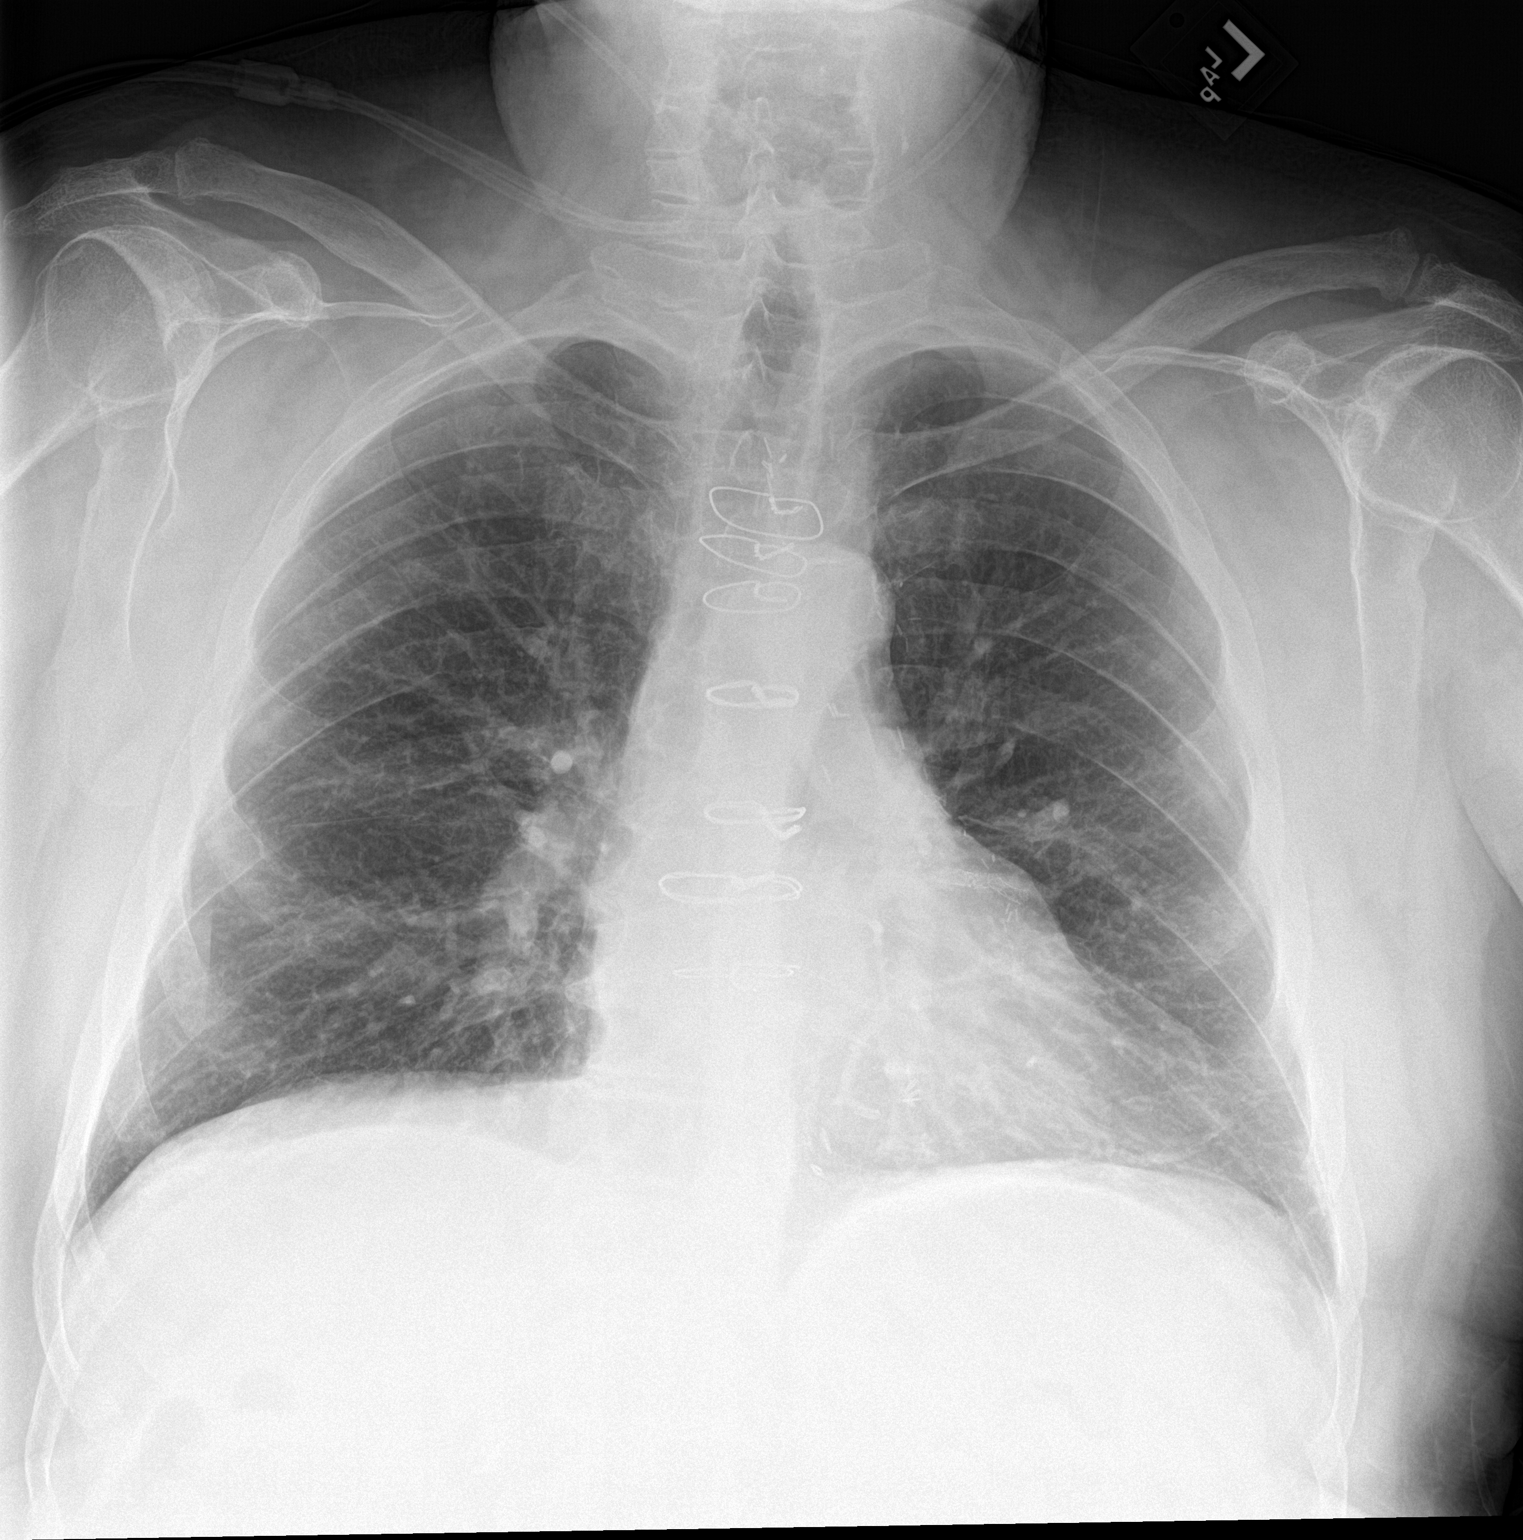

[chest lat]
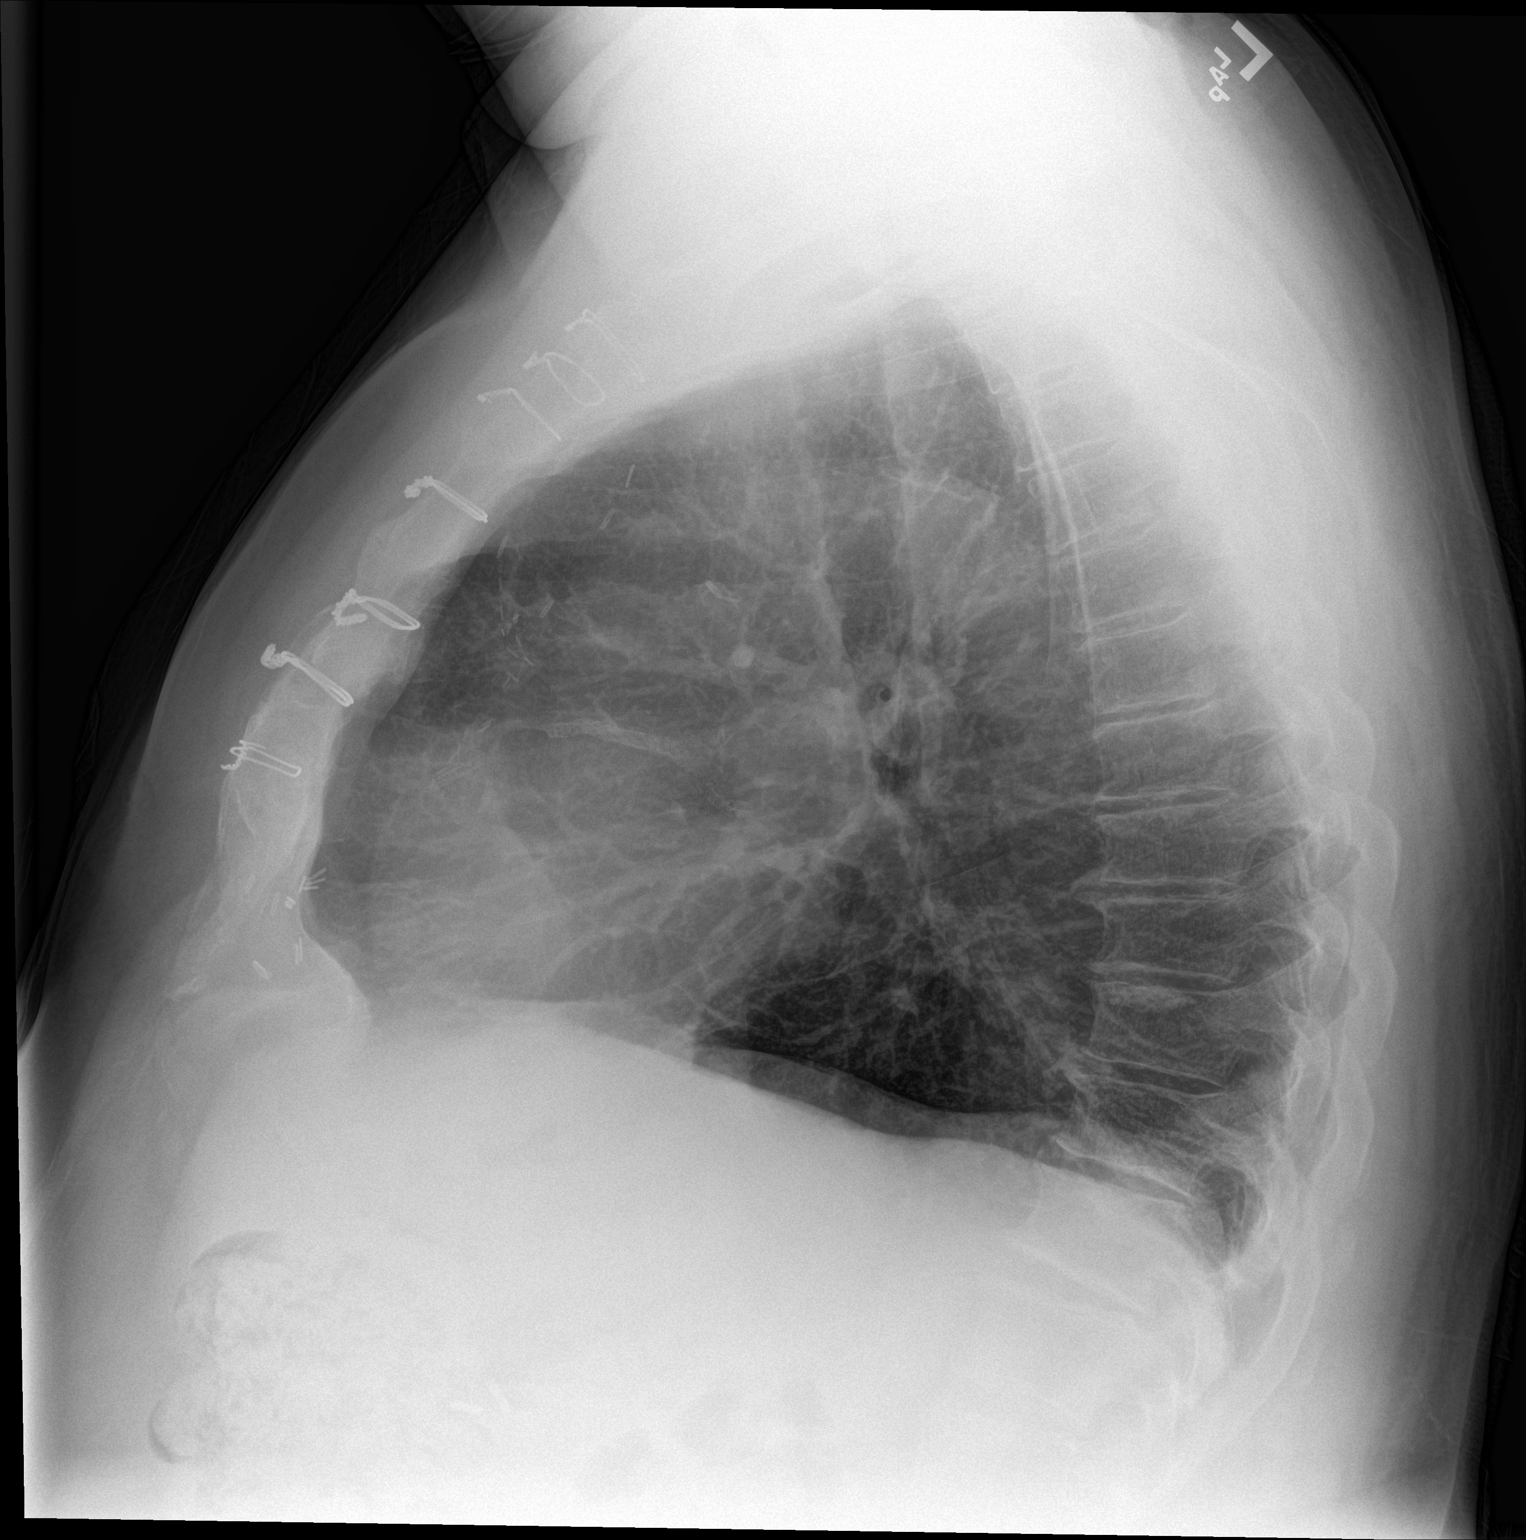

[2 of 2 positions shown; findings below may reference images not displayed]

FINDINGS: There is minimal scarring in the left lung base region. There is no
edema or consolidation. Heart size and pulmonary vascularity are
normal. No adenopathy. Patient is status post internal mammary
bypass grafting. There is a stent in the left anterior descending
coronary artery. There is degenerative change in the thoracic spine.
IMPRESSION: Minimal scarring left base.  No edema or consolidation.

## 2018-02-07 ENCOUNTER — Other Ambulatory Visit: Payer: Self-pay | Admitting: Family Medicine

## 2018-02-07 IMAGING — DX DG CHEST 1V
1 series · 1 of 1 positions shown · non-contrast
Comparison: 03/26/2016

CLINICAL DATA: Congestive heart failure

EXAM:
CHEST 1 VIEW

[chest ap]
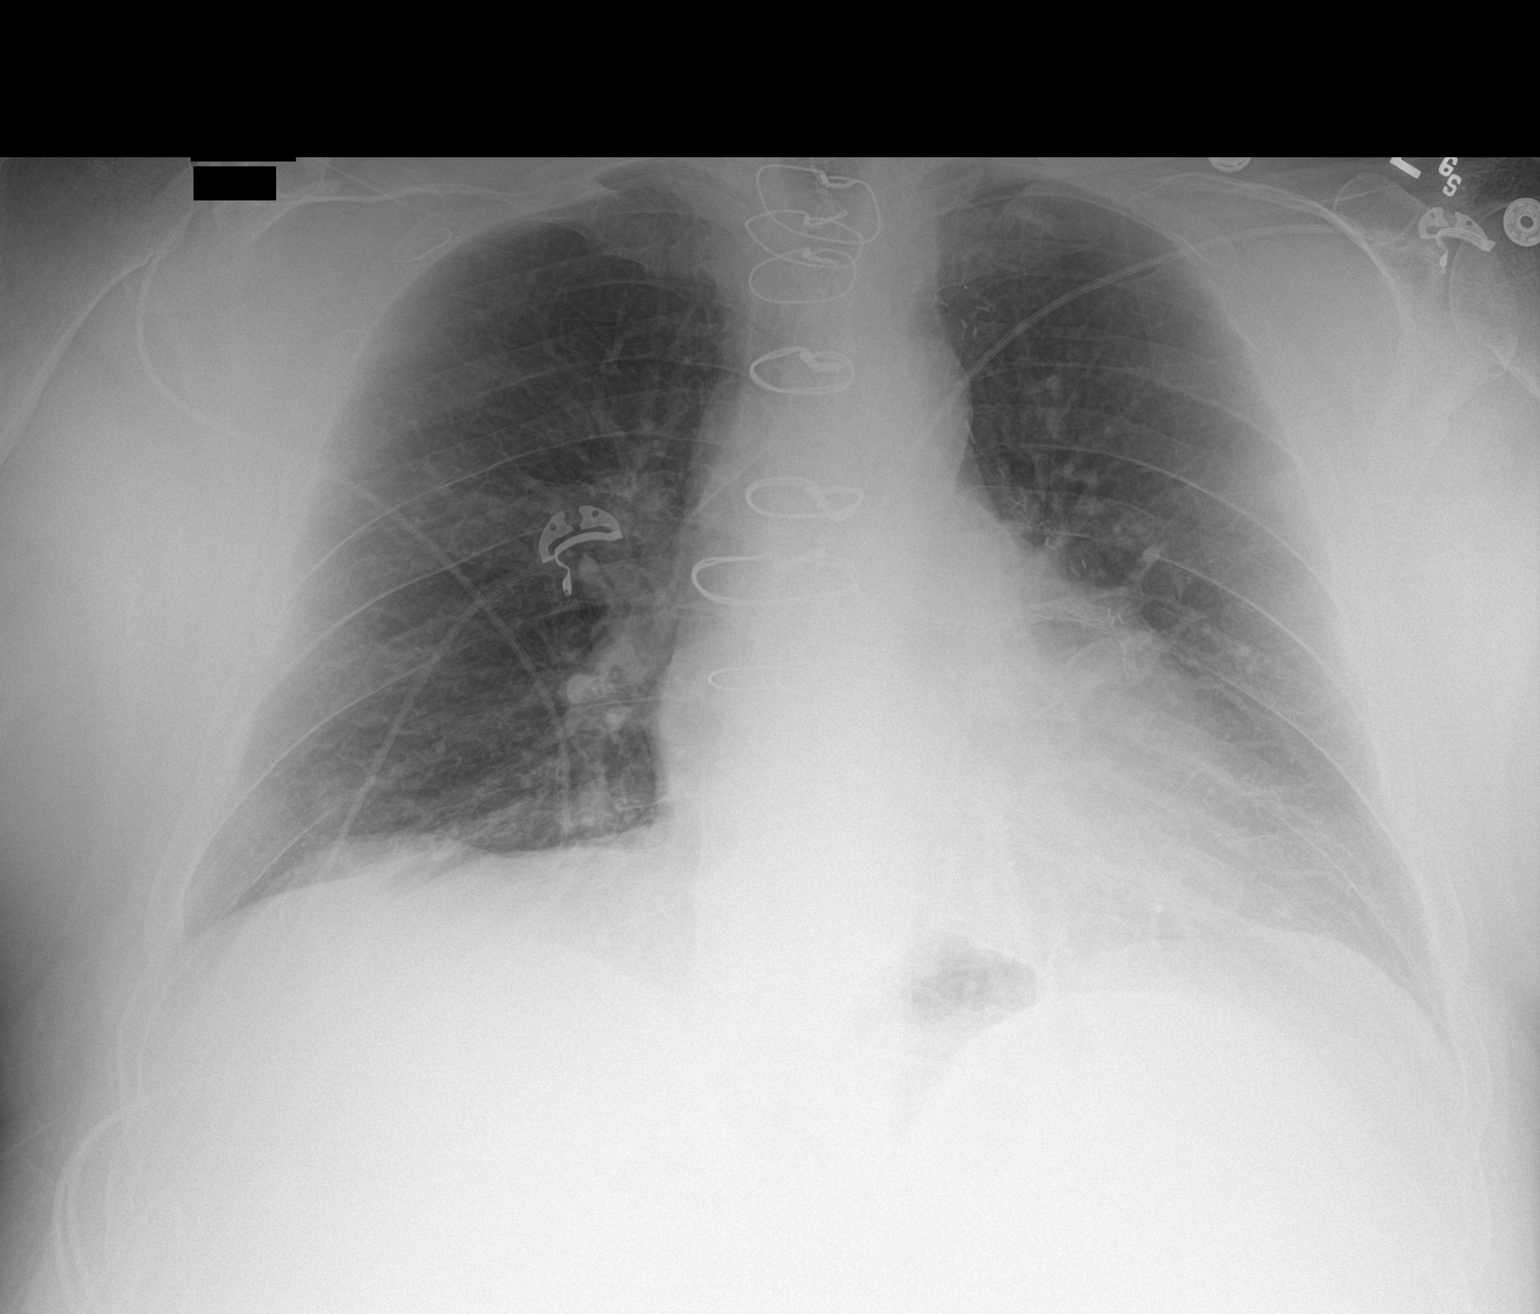

[1 of 1 positions shown; findings below may reference images not displayed]

FINDINGS: Cardiomediastinal silhouette is stable. Status post CABG. Stable
atelectasis or scarring left base. Minimal right basilar linear
atelectasis. No pulmonary edema. No segmental infiltrate
IMPRESSION: Status post CABG. Stable atelectasis or scarring left base. Minimal
right basilar linear atelectasis. No pulmonary edema. No segmental
infiltrate.

## 2018-02-09 DIAGNOSIS — G4710 Hypersomnia, unspecified: Secondary | ICD-10-CM | POA: Diagnosis not present

## 2018-02-09 DIAGNOSIS — I10 Essential (primary) hypertension: Secondary | ICD-10-CM | POA: Diagnosis not present

## 2018-02-09 DIAGNOSIS — J449 Chronic obstructive pulmonary disease, unspecified: Secondary | ICD-10-CM | POA: Diagnosis not present

## 2018-02-09 DIAGNOSIS — J439 Emphysema, unspecified: Secondary | ICD-10-CM | POA: Diagnosis not present

## 2018-02-13 IMAGING — CR DG CHEST 2V
2 series · 2 of 2 positions shown · non-contrast
Comparison: March 28, 2016

CLINICAL DATA: Chest pain for 1 day

EXAM:
CHEST  2 VIEW

[chest ap]
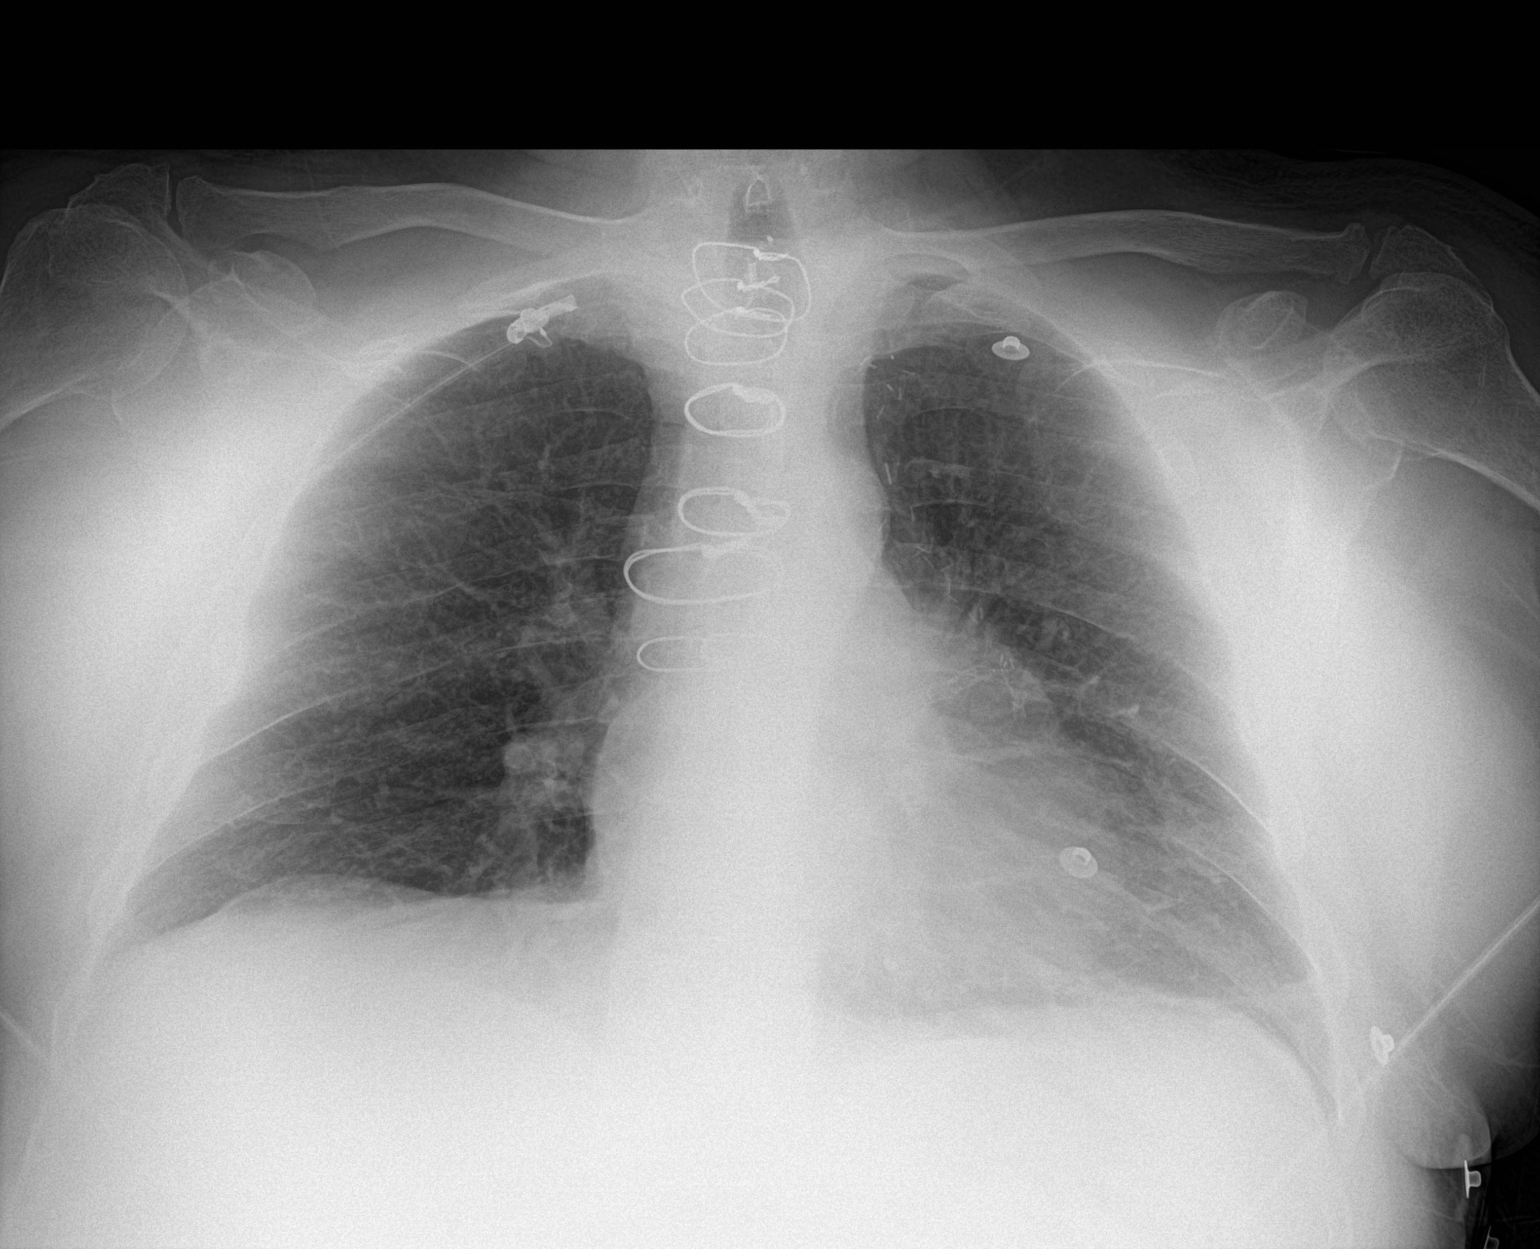

[chest lat]
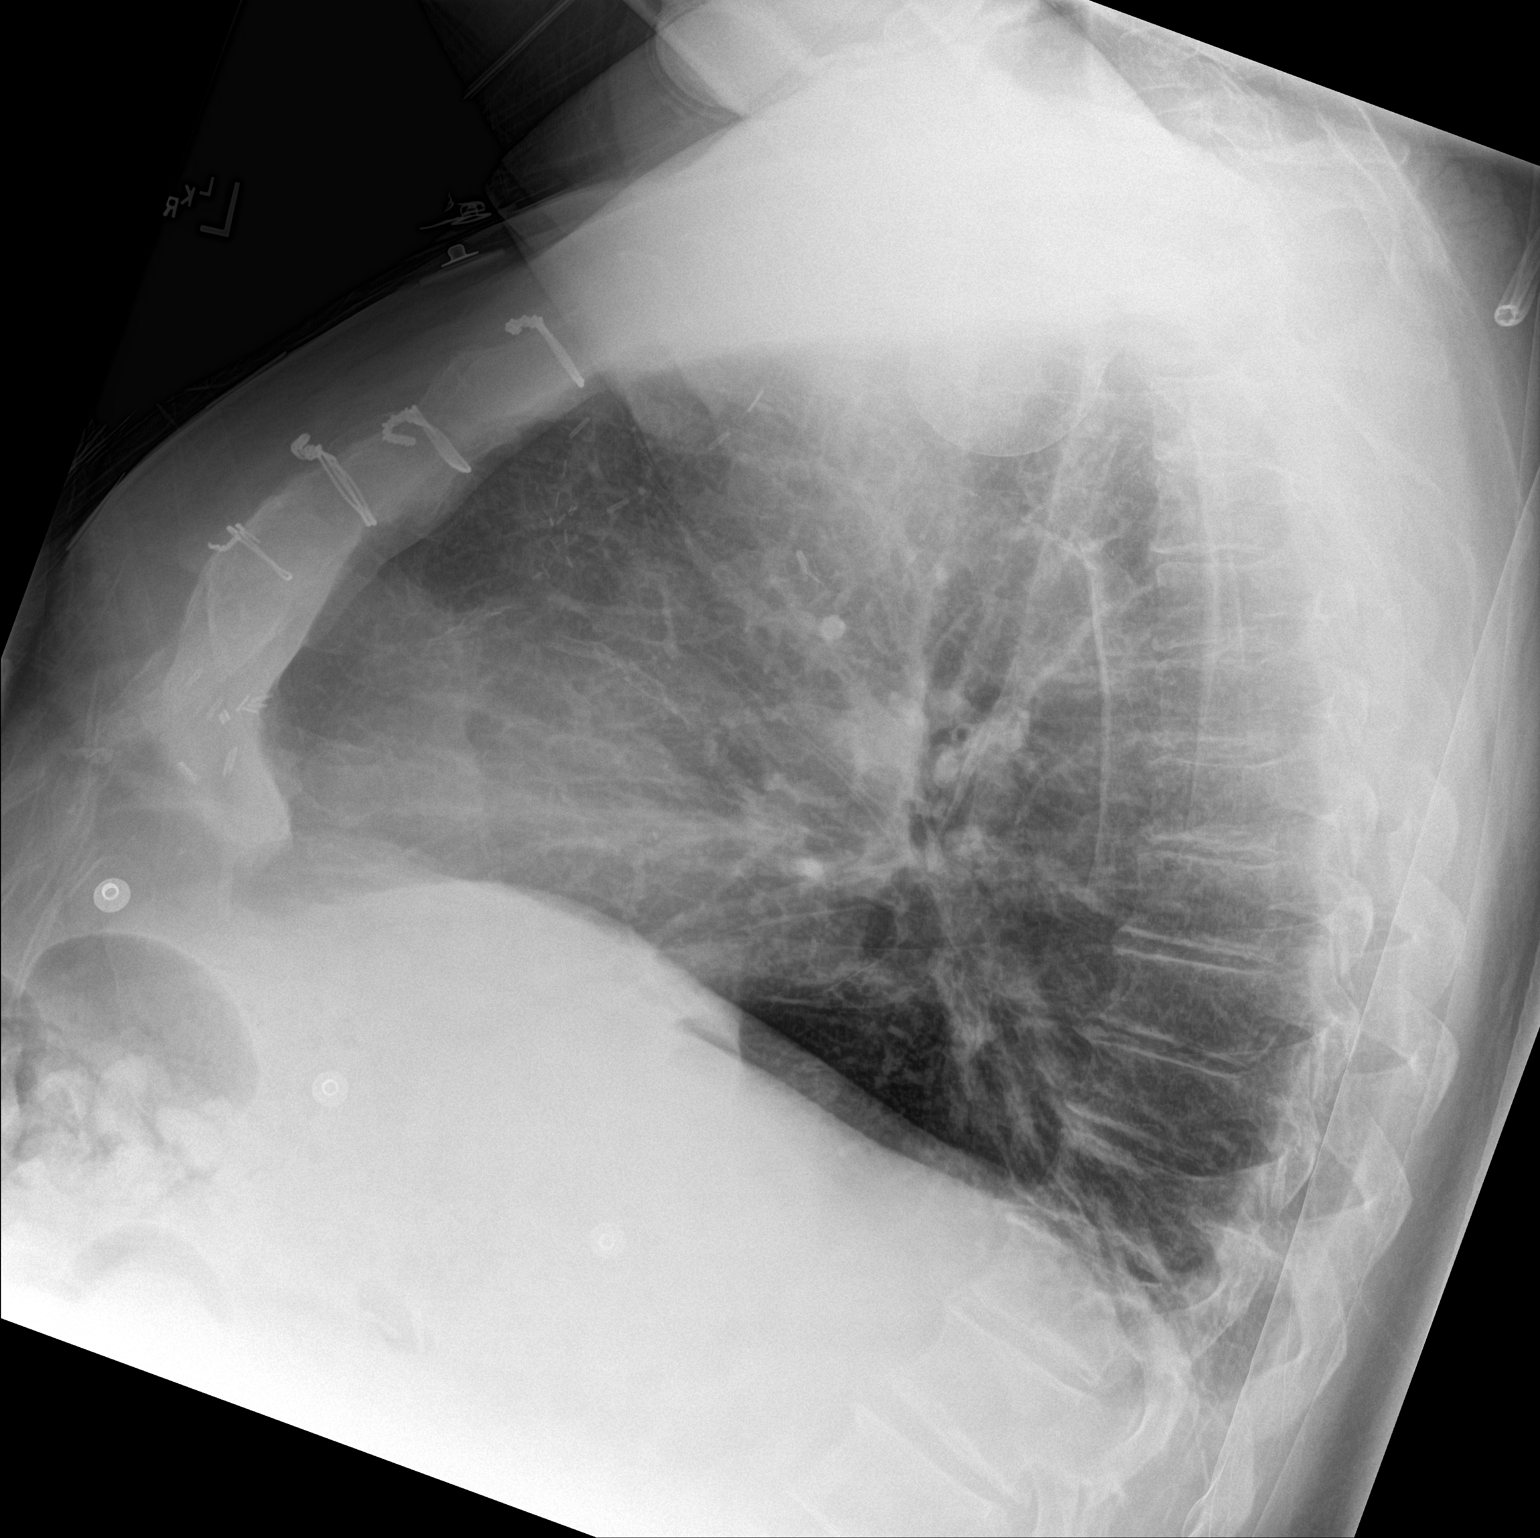

[2 of 2 positions shown; findings below may reference images not displayed]

FINDINGS: There is mild left base atelectasis. Lungs elsewhere clear. Heart
size and pulmonary vascularity are normal. No adenopathy. Patient is
status post internal mammary bypass grafting. There is calcification
in each carotid artery. No pneumothorax.
IMPRESSION: Mild left base atelectasis. No edema or consolidation. Stable
cardiac silhouette. Calcification noted in each carotid artery.

## 2018-02-14 MED ORDER — GLUCOSE BLOOD VI STRP: ORAL_STRIP | 4 refills | Status: DC

## 2018-02-22 ENCOUNTER — Ambulatory Visit: Payer: Self-pay | Admitting: Internal Medicine

## 2018-02-22 ENCOUNTER — Other Ambulatory Visit: Payer: Self-pay | Admitting: Family Medicine

## 2018-02-22 DIAGNOSIS — J441 Chronic obstructive pulmonary disease with (acute) exacerbation: Secondary | ICD-10-CM

## 2018-02-22 MED ORDER — OXYCODONE-ACETAMINOPHEN 10-325 MG PO TABS: 1.0000 | ORAL_TABLET | ORAL | 0 refills | Status: DC | PRN

## 2018-02-22 NOTE — Telephone Encounter (Signed)
Patient needs refill on Oxycodone 10-325 mg. To [facility]

## 2018-02-23 DIAGNOSIS — Z8669 Personal history of other diseases of the nervous system and sense organs: Secondary | ICD-10-CM | POA: Diagnosis not present

## 2018-02-23 DIAGNOSIS — E119 Type 2 diabetes mellitus without complications: Secondary | ICD-10-CM | POA: Diagnosis not present

## 2018-02-23 DIAGNOSIS — H2513 Age-related nuclear cataract, bilateral: Secondary | ICD-10-CM | POA: Diagnosis not present

## 2018-02-23 DIAGNOSIS — H3562 Retinal hemorrhage, left eye: Secondary | ICD-10-CM | POA: Diagnosis not present

## 2018-02-23 DIAGNOSIS — H53021 Refractive amblyopia, right eye: Secondary | ICD-10-CM | POA: Diagnosis not present

## 2018-02-23 LAB — HM DIABETES EYE EXAM

## 2018-03-03 DIAGNOSIS — I10 Essential (primary) hypertension: Secondary | ICD-10-CM | POA: Diagnosis not present

## 2018-03-03 DIAGNOSIS — I480 Paroxysmal atrial fibrillation: Secondary | ICD-10-CM | POA: Diagnosis not present

## 2018-03-03 DIAGNOSIS — I2510 Atherosclerotic heart disease of native coronary artery without angina pectoris: Secondary | ICD-10-CM | POA: Diagnosis not present

## 2018-03-04 IMAGING — CT CT ABD-PELV W/ CM
2 of 5 series · 16 of 46 positions shown, 18 images · IV contrast (iopamidol)
Comparison: 02/27/2016

CLINICAL DATA: Upper abdominal pain

EXAM:
CT ABDOMEN AND PELVIS WITH CONTRAST
TECHNIQUE: Multidetector CT imaging of the abdomen and pelvis was performed
using the standard protocol following bolus administration of
intravenous contrast.
CONTRAST:  100mL U3V1NM-SZZ IOPAMIDOL (U3V1NM-SZZ) INJECTION 61%

[Series 2: routine abd pel with · axial · 0.89mm/px · z∈[-469,-39]mm · 13 of 98 slices shown, 15 images]
[im 6/98  soft-tissue]
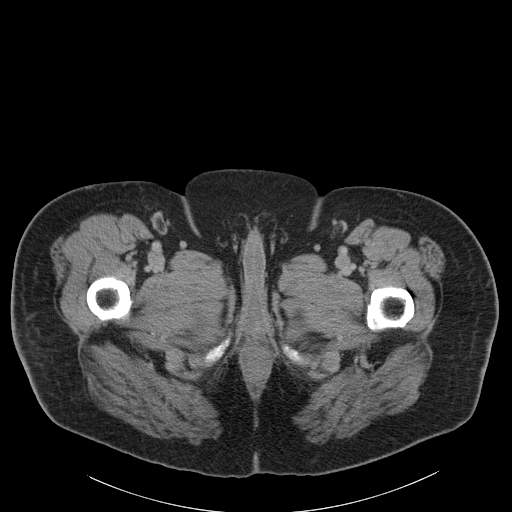
[im 6/98  bone]
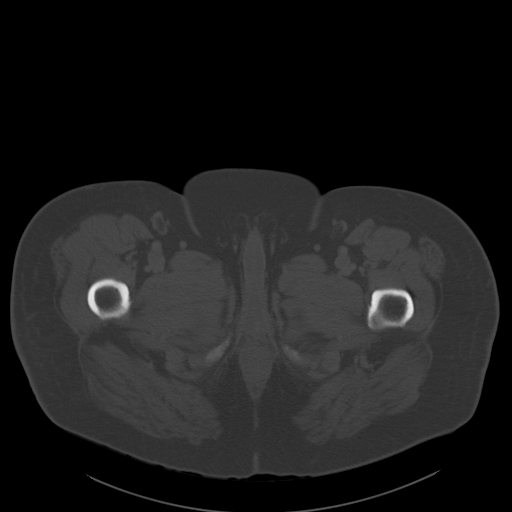
[im 16/98  soft-tissue]
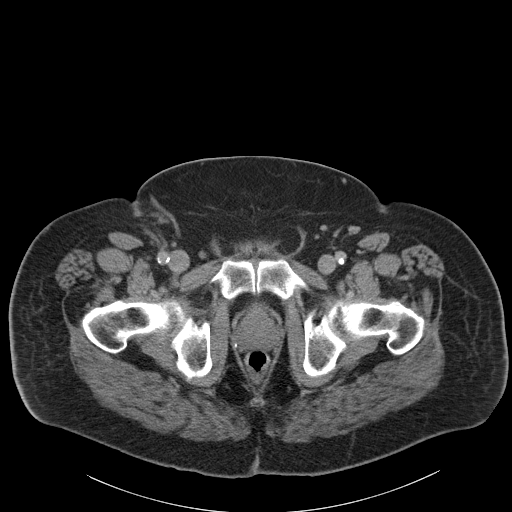
[im 21/98  soft-tissue]
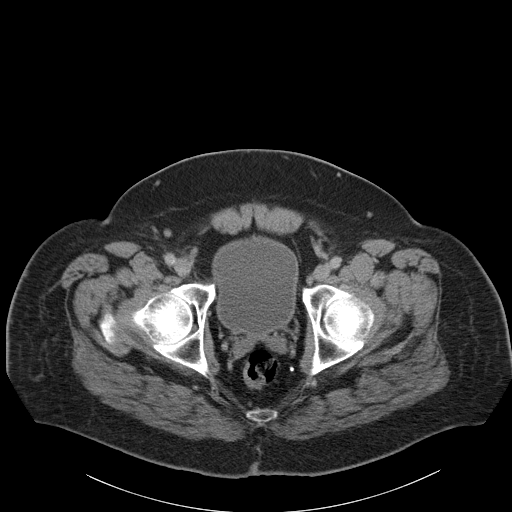
[im 26/98  soft-tissue]
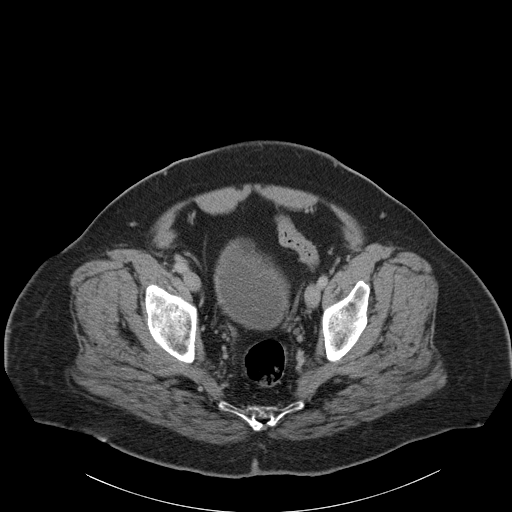
[im 36/98  soft-tissue]
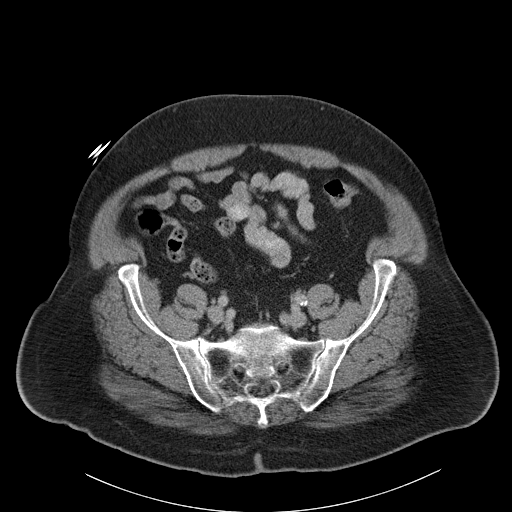
[im 41/98  soft-tissue]
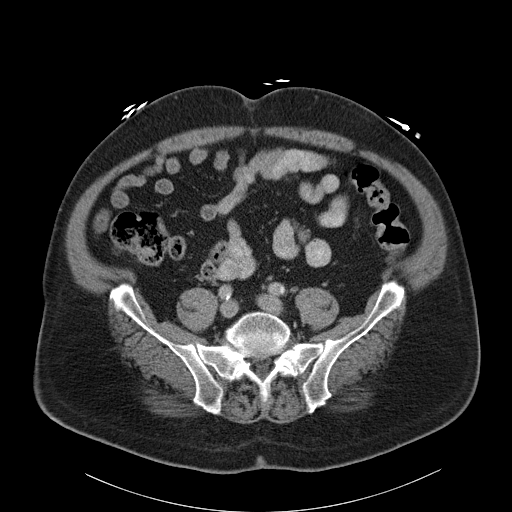
[im 52/98  soft-tissue]
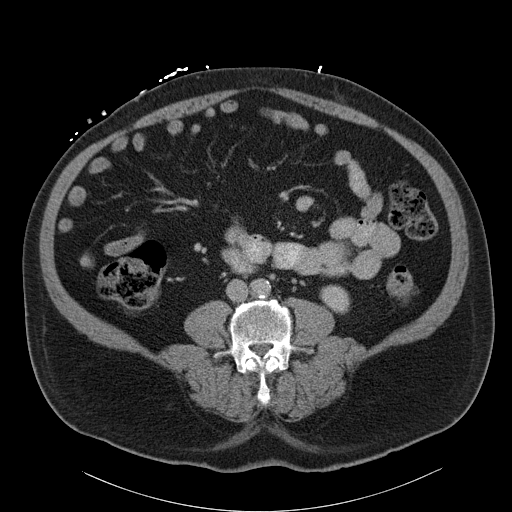
[im 57/98  soft-tissue]
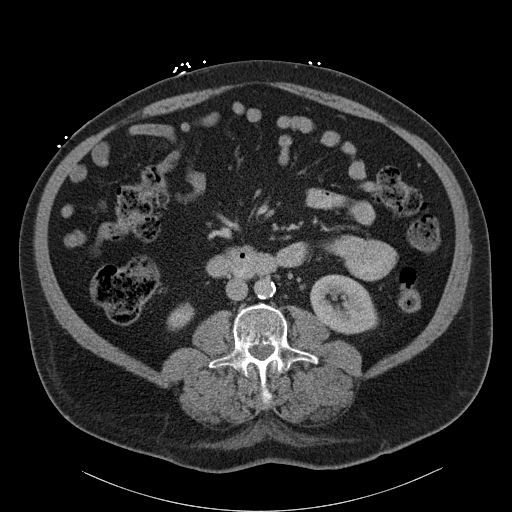
[im 62/98  soft-tissue]
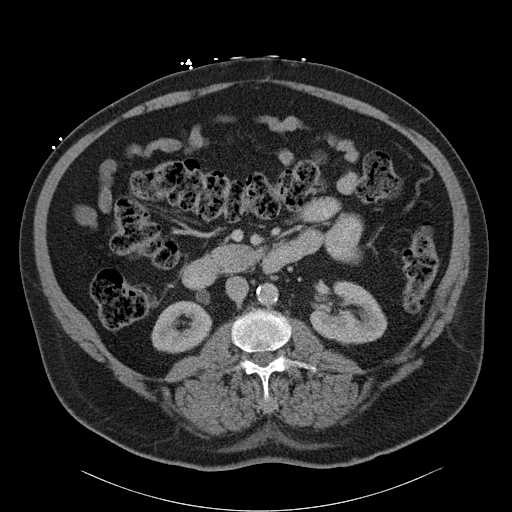
[im 62/98  bone]
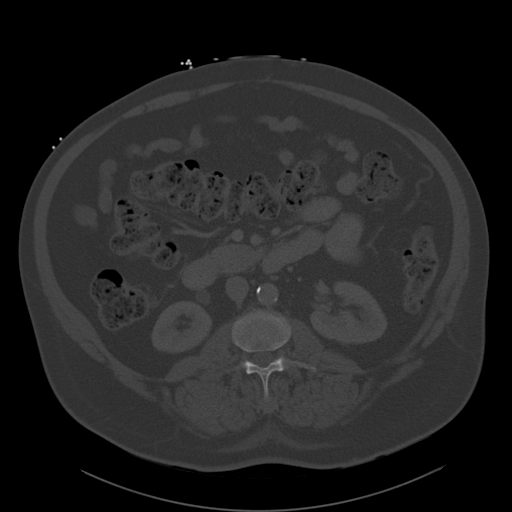
[im 72/98  soft-tissue]
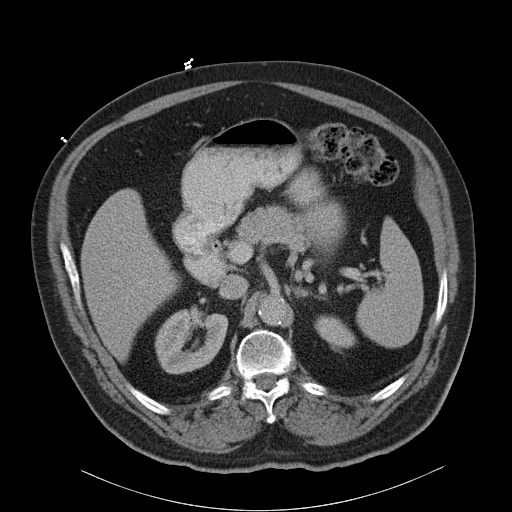
[im 77/98  soft-tissue]
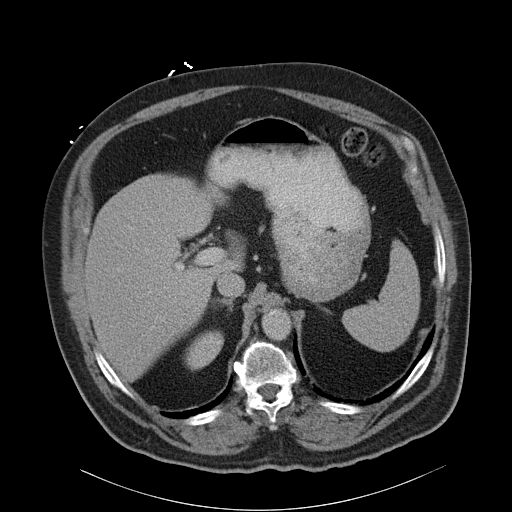
[im 82/98  soft-tissue]
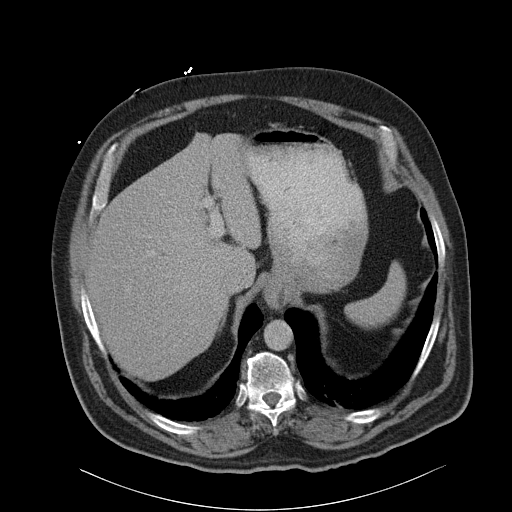
[im 92/98  soft-tissue]
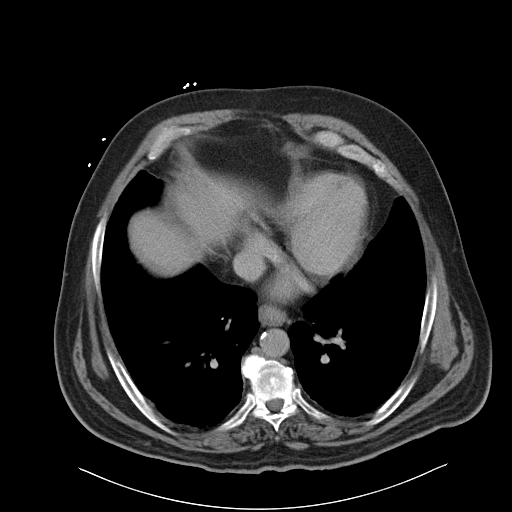

[Series 5: cor routine abd pel with · coronal · 0.82mm/px · 3 of 179 slices shown]
[im 60/179  soft-tissue]
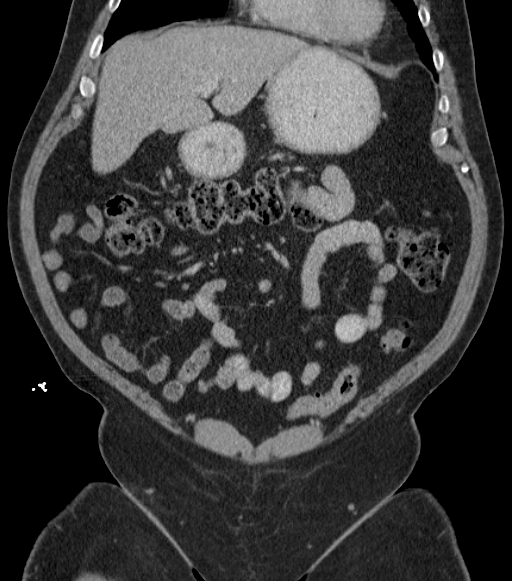
[im 80/179  soft-tissue]
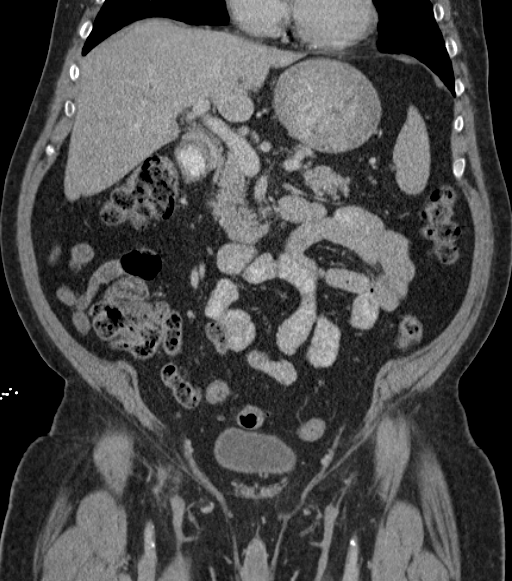
[im 99/179  soft-tissue]
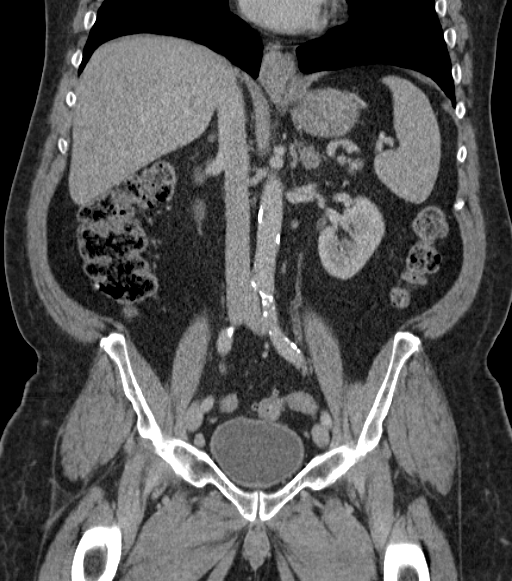

[16 of 46 positions shown; findings below may reference images not displayed]

FINDINGS: Lower chest and abdominal wall: Subendocardial low density centered
on the left ventricular apex consistent with remote infarct. Mild
basilar atelectasis. Mildly thickened appearance of the lower
esophagus is likely from incomplete collapse and small hiatal
hernia. This appearance is stable prior. Fatty left inguinal hernia.

Hepatobiliary: No focal liver abnormality.Cholecystectomy with no
common bile duct distention.

Pancreas: Unremarkable.

Spleen: Unremarkable.

Adrenals/Urinary Tract: Bilateral 11 mm adrenal nodule stable from
6761 at least and attributed to adenoma. Symmetric renal
enhancement. No hydronephrosis or stone. Unremarkable bladder.

Reproductive:Negative.

Stomach/Bowel: No obstruction. No appendicitis. Mild distal colonic
diverticulosis. Uncomplicated duodenal diverticulum.

Vascular/Lymphatic: No acute vascular abnormality. Atherosclerosis.
Stranding ventral to the right common femoral artery is neighboring
a vascular clip and attributed to scar. No mass or adenopathy.

Peritoneal: No ascites or pneumoperitoneum.

Musculoskeletal: Facet arthropathy greatest at L4-5. No acute
finding.
IMPRESSION: 1. No acute finding or change from prior.
2. Remote left ventricular infarct.
3. Cholecystectomy.
4. Additional incidental findings noted above.

## 2018-03-10 ENCOUNTER — Ambulatory Visit: Payer: Self-pay | Admitting: Internal Medicine

## 2018-03-11 ENCOUNTER — Other Ambulatory Visit: Payer: Self-pay | Admitting: Family

## 2018-03-11 ENCOUNTER — Other Ambulatory Visit: Payer: Self-pay | Admitting: Family Medicine

## 2018-03-12 DIAGNOSIS — J449 Chronic obstructive pulmonary disease, unspecified: Secondary | ICD-10-CM | POA: Diagnosis not present

## 2018-03-12 DIAGNOSIS — J439 Emphysema, unspecified: Secondary | ICD-10-CM | POA: Diagnosis not present

## 2018-03-12 DIAGNOSIS — I10 Essential (primary) hypertension: Secondary | ICD-10-CM | POA: Diagnosis not present

## 2018-03-12 DIAGNOSIS — G4710 Hypersomnia, unspecified: Secondary | ICD-10-CM | POA: Diagnosis not present

## 2018-03-14 ENCOUNTER — Ambulatory Visit (INDEPENDENT_AMBULATORY_CARE_PROVIDER_SITE_OTHER): Payer: Medicare HMO | Admitting: Internal Medicine

## 2018-03-14 ENCOUNTER — Encounter: Payer: Self-pay | Admitting: Internal Medicine

## 2018-03-14 VITALS — BP 124/82 | HR 66 | Ht 67.0 in | Wt 245.8 lb

## 2018-03-14 DIAGNOSIS — H5203 Hypermetropia, bilateral: Secondary | ICD-10-CM | POA: Diagnosis not present

## 2018-03-14 DIAGNOSIS — Z9981 Dependence on supplemental oxygen: Secondary | ICD-10-CM

## 2018-03-14 DIAGNOSIS — Z0100 Encounter for examination of eyes and vision without abnormal findings: Secondary | ICD-10-CM | POA: Diagnosis not present

## 2018-03-14 DIAGNOSIS — H524 Presbyopia: Secondary | ICD-10-CM | POA: Diagnosis not present

## 2018-03-14 DIAGNOSIS — J449 Chronic obstructive pulmonary disease, unspecified: Secondary | ICD-10-CM | POA: Diagnosis not present

## 2018-03-14 DIAGNOSIS — J9611 Chronic respiratory failure with hypoxia: Secondary | ICD-10-CM

## 2018-03-14 DIAGNOSIS — R0602 Shortness of breath: Secondary | ICD-10-CM | POA: Diagnosis not present

## 2018-03-14 DIAGNOSIS — I480 Paroxysmal atrial fibrillation: Secondary | ICD-10-CM | POA: Diagnosis not present

## 2018-03-14 NOTE — Patient Instructions (Signed)
Home Oxygen Use, Adult    When a medical condition keeps you from getting enough oxygen, your health care provider may instruct you to take extra oxygen at home. Your health care provider will let you know:   When to take oxygen.   For how long to take oxygen.   How quickly oxygen should be delivered (flow rate), in liters per minute (LPM or L/M).    Home oxygen can be given through:   A mask.   A nasal cannula. This is a device or tube that goes in the nostrils.   A transtracheal catheter. This is a small, flexible tube placed in the trachea.   A tracheostomy. This is a surgically made opening in the trachea.    These devices are connected with tubing to an oxygen source, such as:   A tank. Tanks hold oxygen in gas form. They must be replaced when the oxygen is used up.   A liquid oxygen device. This holds oxygen in liquid form. It must be replaced when the oxygen is used up.   An oxygen concentrator machine. This filters oxygen in the room. It uses electricity, so you must have a backup cylinder of oxygen in case the power goes out.    Supplies needed:  To use oxygen, you will need:   A mask, nasal cannula, transtracheal catheter, or tracheostomy.   An oxygen tank, a liquid oxygen device, or an oxygen concentrator.   The tape that your health care provider recommends (optional).    If you use a transtracheal catheter and your prescribed flow rate is 1 LPM or greater, you will also need a humidifier.  Risks and complications   Fire. This can happen if the oxygen is exposed to a heat source, flame, or spark.   Injury to skin. This can happen if liquid oxygen touches your skin.   Organ damage. This can happen if you get too little oxygen.  How to use oxygen    Your health care provider will show you how to use your oxygen device. Follow her or his instructions. They may look something like this:  1. Wash your hands.  2. If you use an oxygen concentrator, make sure it is plugged in.  3. Place one  end of the tube into the port on the tank, device, or machine.  4. Place the mask over your nose and mouth. Or, place the nasal cannula and secure it with tape if instructed. If you use a tracheostomy or transtracheal catheter, connect it to the oxygen source as directed.  5. Make sure the liter-flow setting on the machine is at the level prescribed by your health care provider.  6. Turn on the machine or adjust the knob on the tank or device to the correct liter-flow setting.  7. When you are done, turn off and unplug the machine, or turn the knob to OFF.    How to clean and care for the oxygen supplies  Nasal cannula   Clean it with a warm, wet cloth daily or as needed.   Wash it with a liquid soap once a week.   Rinse it thoroughly once or twice a week.   Replace it every 2-4 weeks.   If you have an infection, such as a cold or pneumonia, change the cannula when you get better.  Mask   Replace it every 2-4 weeks.   If you have an infection, such as a cold or pneumonia, change the mask when   you get better.  Humidifier bottle   Wash the bottle between each refill:  ? Wash it with soap and warm water.  ? Rinse it thoroughly.  ? Disinfect it and its top.  ? Air-dry it.   Make sure it is dry before you refill it.  Oxygen concentrator   Clean the air filter at least twice a week according to directions from your home medical equipment and service company.   Wipe down the cabinet every day. To do this:  ? Unplug the unit.  ? Wipe down the cabinet with a damp cloth.  ? Dry the cabinet.  Other equipment   Change any extra tubing every 1-3 months.   Follow instructions from your health care provider about taking care of any other equipment.  Safety tips  Fire safety tips       Keep your oxygen and oxygen supplies at least 5 ft away from sources of heat, flames, and sparks at all times.   Do not allow smoking near your oxygen. Put up "no smoking" signs in your home.   Do not use materials that can burn  (are flammable) while you use oxygen.   When you go to a restaurant with portable oxygen, ask to be seated in the nonsmoking section.   Keep a fire extinguisher close by. Let your fire department know that you have oxygen in your home.   Test your home smoke detectors regularly.  General safety tips   If you use an oxygen cylinder, make sure it is in a stand or secured to an object that will not move (fixed object).   If you use liquid oxygen, make sure its container is kept upright.   If you use an oxygen concentrator:  ? Tell your electric company. Make sure you are given priority service in the event that your power goes out.  ? Avoid using extension cords, if possible.  Follow these instructions at home:   Use oxygen only as told by your health care provider.   Do not use alcohol or other drugs that make you relax (sedating drugs) unless instructed. They can slow down your breathing rate and make it hard to get in enough oxygen.   Know how and when to order a refill of oxygen.   Always keep a spare tank of oxygen. Plan ahead for holidays when you may not be able to get a prescription filled.   Use water-based lubricants on your lips or nostrils. Do not use oil-based products like petroleum jelly.   To prevent skin irritation on your cheeks or behind your ears, tuck some gauze under the tubing.  Contact a health care provider if:   You get headaches often.   You have shortness of breath.   You have a lasting cough.   You have anxiety.   You are sleepy all the time.   You develop an illness that affects your breathing.   You cannot exercise at your regular level.   You are restless.   You have difficult or irregular breathing, and it is getting worse.   You have a fever.   You have persistent redness under your nose.  Get help right away if:   You are confused.   You have blue lips or fingernails.   You are struggling to breathe.  This information is not intended to replace advice given  to you by your health care provider. Make sure you discuss any questions you have with your health   care provider.  Document Released: 01/23/2004 Document Revised: 07/01/2016 Document Reviewed: 05/26/2016  [employer] Interactive Patient Education  2018 [employer]

## 2018-03-14 NOTE — Progress Notes (Signed)
[facility] [place], [place] [address]  Pulmonary Sleep Medicine   Office Visit Note  Patient Name: Lawrence [name] DOB: 06-18-1956 MRN [id number]  Date of Service: 03/14/2018  Complaints/HPI: Patient is here for evaluation preoperatively.  The patient has moderate COPD spine treat today shows FEV1 of 55%.  Patient is chronic oxygen dependent.  The patient has had some progression of his COPD over last several years.  Currently does not smoke.  He has excellent saturations noted.  Exercise tolerance is fair.  He has had surgical procedures done before without really much in the way of issues.  Today he denies any cough no congestion.  Has no chest noted.  Denies any nausea vomiting no fevers.  Has been  Compliant with his oxygen.  ROS  General: (-) fever, (-) chills, (-) night sweats, (-) weakness Skin: (-) rashes, (-) itching,. Eyes: (-) visual changes, (-) redness, (-) itching. Nose and Sinuses: (-) nasal stuffiness or itchiness, (-) postnasal drip, (-) nosebleeds, (-) sinus trouble. Mouth and Throat: (-) sore throat, (-) hoarseness. Neck: (-) swollen glands, (-) enlarged thyroid, (-) neck pain. Respiratory: - cough, (-) bloody sputum, + shortness of breath, - wheezing. Cardiovascular: - ankle swelling, (-) chest pain. Lymphatic: (-) lymph node enlargement. Neurologic: (-) numbness, (-) tingling. Psychiatric: (-) anxiety, (-) depression   Current Medication: Outpatient Encounter Medications as of 03/14/2018  Medication Sig Note  . albuterol (PROVENTIL HFA;VENTOLIN HFA) 108 (90 Base) MCG/ACT inhaler Inhale 2 puffs into the lungs every 4 (four) hours as needed for wheezing or shortness of breath. Reported on 04/08/2016   . albuterol (PROVENTIL) (2.5 MG/3ML) 0.083% nebulizer solution Take 3 mLs (2.5 mg total) by nebulization every 6 (six) hours as needed for wheezing or shortness of breath.   . allopurinol (ZYLOPRIM) 100 MG tablet TAKE 3 TABLETS BY MOUTH  EVERY DAY.   Marland Kitchen ALPRAZolam (XANAX) 1 MG tablet TAKE ONE-HALF TO ONE TABLET BY MOUTH THREE TIMES DAILY AS NEEDED FOR ANXIETY   . aspirin 81 MG chewable tablet Chew 81 mg by mouth daily.   Marland Kitchen atorvastatin (LIPITOR) 80 MG tablet TAKE ONE TABLET BY MOUTH EVERY NIGHT AT BEDTIME   . BANOPHEN 25 MG capsule TAKE 1 CAPSULE (25MG TOTAL) BY MOUTH EVERY 4 (FOUR) HOURS AS NEEDED.   Marland Kitchen Blood Glucose Monitoring Suppl (ACCU-CHEK AVIVA PLUS) w/Device KIT CHECK BLOOD SUGAR EVERY DAY   . cetirizine (ZYRTEC) 10 MG tablet TAKE ONE TABLET BY MOUTH AT BEDTIME. (Patient taking differently: 1-2 tid prn)   . cyclobenzaprine (FLEXERIL) 5 MG tablet TAKE 1 TO 2 TABLETS BY MOUTH THREE TIMESDAILY AS NEEDED (BACK PAIN)   . docusate sodium (COLACE) 100 MG capsule Take 100 mg by mouth daily.  11/13/2016: Take 2 capsules once a day   . ELIQUIS 5 MG TABS tablet TAKE 1 TABLET BY MOUTH TWICE A DAY.   . fluticasone furoate-vilanterol (BREO ELLIPTA) 100-25 MCG/INH AEPB Inhale 1 puff into the lungs daily.   Marland Kitchen glucose blood (ACCU-CHEK AVIVA PLUS) test strip Use to check blood sugar once a day   . hydrocortisone 1 % ointment Apply 1 application topically 2 (two) times daily. Apply to rash   . isosorbide mononitrate (IMDUR) 60 MG 24 hr tablet Take 1 tablet (60 mg total) by mouth daily. (Patient taking differently: Take 60 mg by mouth 2 (two) times daily. )   . lansoprazole (PREVACID) 30 MG capsule Take 30 mg by mouth 2 (two) times daily.    Marland Kitchen lisinopril (  PRINIVIL,ZESTRIL) 2.5 MG tablet TAKE 1 TABLET BY MOUTH EVERY DAY   . Magnesium Oxide 400 (240 Mg) MG TABS TAKE ONE TABLET BY MOUTH DAILY   . metFORMIN (GLUCOPHAGE) 500 MG tablet TAKE 1 TABLET BY MOUTH EVERY MORNING   . nitroGLYCERIN (NITROSTAT) 0.4 MG SL tablet Place 1 tablet (0.4 mg total) under the tongue every 5 (five) minutes as needed for chest pain. Reported on 05/26/2016 11/13/2016: Available if needed.   Marland Kitchen omega-3 acid ethyl esters (LOVAZA) 1 g capsule TAKE FOUR CAPSULES BY MOUTH DAILY    . ondansetron (ZOFRAN) 4 MG tablet Take 1 tablet (4 mg total) by mouth every 8 (eight) hours as needed for nausea or vomiting.   Marland Kitchen oxyCODONE-acetaminophen (PERCOCET) 10-325 MG tablet Take 1 tablet by mouth every 4 (four) hours as needed.   . pantoprazole (PROTONIX) 40 MG tablet Take 40 mg by mouth daily.   . potassium chloride SA (K-DUR,KLOR-CON) 20 MEQ tablet    . promethazine (PHENERGAN) 25 MG tablet Take 1 tablet (25 mg total) by mouth every 8 (eight) hours as needed for nausea or vomiting.   Marland Kitchen RANEXA 500 MG 12 hr tablet Take 1,000 mg by mouth 2 (two) times daily.   . sotalol (BETAPACE) 120 MG tablet Take 120 mg by mouth 2 (two) times daily.  09/08/2016: Pt taking twice a day   . SPIRIVA HANDIHALER 18 MCG inhalation capsule INHALE ONE CAPSULE AS DIRECTED ONCE A DAY   . sucralfate (CARAFATE) 1 g tablet Take 1 tablet (1 g total) by mouth 2 (two) times daily.   . tamsulosin (FLOMAX) 0.4 MG CAPS capsule Take by mouth.   . torsemide (DEMADEX) 100 MG tablet Take 0.5 tablets by mouth daily.   . traMADol (ULTRAM) 50 MG tablet Take 1 tablet (50 mg total) by mouth every 6 (six) hours as needed.   . [DISCONTINUED] JARDIANCE 10 MG TABS tablet TAKE 1 TABLET BY MOUTH EVERY DAY (Patient not taking: Reported on 03/14/2018)    No facility-administered encounter medications on file as of 03/14/2018.     Surgical History: Past Surgical History:  Procedure Laterality Date  . BYPASS GRAFT    . CARDIAC CATHETERIZATION N/A 07/12/2015   rocedure: Left Heart Cath and Cors/Grafts Angiography;  Surgeon: Belva Crome, MD;  Location: [facility];  Service: Cardiovascular;  Laterality: N/A;  . CARDIAC CATHETERIZATION Right 10/07/2015   Procedure: Left Heart Cath and Cors/Grafts Angiography;  Surgeon: Dionisio David, MD;  Location: [place] [facility];  Service: Cardiovascular;  Laterality: Right;  . CARDIAC CATHETERIZATION N/A 04/06/2016   Procedure: Left Heart Cath and Coronary Angiography;  Surgeon: Yolonda Kida, MD;  Location: [place] [facility];  Service: Cardiovascular;  Laterality: N/A;  . CARDIAC CATHETERIZATION  04/06/2016   Procedure: Bypass Graft Angiography;  Surgeon: Yolonda Kida, MD;  Location: [place] [facility];  Service: Cardiovascular;;  . CARDIAC CATHETERIZATION N/A 11/02/2016   Procedure: Left Heart Cath and Cors/Grafts Angiography and possible PCI;  Surgeon: Yolonda Kida, MD;  Location: [place] [facility];  Service: Cardiovascular;  Laterality: N/A;  . CARDIAC CATHETERIZATION N/A 11/02/2016   Procedure: Coronary Stent Intervention;  Surgeon: Yolonda Kida, MD;  Location: [facility];  Service: Cardiovascular;  Laterality: N/A;  . CHOLECYSTECTOMY    . ESOPHAGEAL DILATION    . TONSILLECTOMY    . VASCULAR SURGERY      Medical History: Past Medical History:  Diagnosis Date  . A-fib ([place])   .  Anemia   . Anxiety   . Asthma   . CAD (coronary artery disease)    a. 2002 CABGx2 (LIMA->LAD, VG->VG->OM1);  b. 09/2012 DES->OM;  c. 03/2015 PTCA of LAD [facility]) in setting of atretic LIMA; d. 05/2015 Cath [facility]): nonobs dzs; e. 06/2015 Cath ([facility]): LM nl, LAD 45p/d ISR, 50d, D1/2 small, LCX 50p/d ISR, OM1 70ost, 30 ISR, VG->OM1 50ost, 52m LIMA->LAD 99p/d - atretic, RCA dom, nl; f.cath 10/16: 40-50%(FFR 0.90) pLAD, 75% (FFR 0.77) mLAD s/p PCI/DES, oRCA 40% (FFR0.95)  . Celiac disease   . Chronic diastolic CHF (congestive heart failure) ([place]    a. 06/2009 Echo: EF 60-65%, Gr 1 DD, triv AI, mildly dil LA, nl RV.  .Marland KitchenCOPD (chronic obstructive pulmonary disease) ([place]    a. Chronic bronchitis and emphysema.  .Marland KitchenDysrhythmia   . Essential hypertension   . History of tobacco abuse    a. Quit 2014.  .Marland KitchenPSVT (paroxysmal supraventricular tachycardia) ([place]    a. 10/2012 Noted on [facility].  . Type II diabetes mellitus ([place]     Family History: Family History  Problem Relation Age of Onset  . Heart attack Mother   . Depression Mother   . Heart disease Mother   .  COPD Mother   . Hypertension Mother   . Heart attack Father   . Diabetes Father   . Depression Father   . Heart disease Father   . Cirrhosis Father   . Parkinson's disease Brother     Social History: Social History   Socioeconomic History  . Marital status: Married    Spouse name: Not on file  . Number of children: 1  . Years of education: Not on file  . Highest education level: 10th grade  Occupational History  . Occupation: Disabled  Social Needs  . Financial resource strain: Not hard at all  . Food insecurity:    Worry: Never true    Inability: Never true  . Transportation needs:    Medical: No    Non-medical: No  Tobacco Use  . Smoking status: Former Smoker    Last attempt to quit: 04/22/2013    Years since quitting: 4.8  . Smokeless tobacco: Never Used  Substance and Sexual Activity  . Alcohol use: No    Comment: remotely quit alcohol use. Hx of heavy alcohol use.  . Drug use: No  . Sexual activity: Not on file  Lifestyle  . Physical activity:    Days per week: Not on file    Minutes per session: Not on file  . Stress: Only a little  Relationships  . Social connections:    Talks on phone: Not on file    Gets together: Not on file    Attends religious service: Not on file    Active member of club or organization: Not on file    Attends meetings of clubs or organizations: Not on file    Relationship status: Not on file  . Intimate partner violence:    Fear of current or ex partner: Not on file    Emotionally abused: Not on file    Physically abused: Not on file    Forced sexual activity: Not on file  Other Topics Concern  . Not on file  Social History Narrative   [place]  Does not routinely exercise.    Vital Signs: Blood pressure 124/82, pulse 66, height 5' 7"  (1.702 m), weight 245 lb 12.8 oz (111.5 kg), SpO2  96 %.  Examination: General Appearance: The patient is well-developed, well-nourished, and in no distress. Skin:  Gross inspection of skin unremarkable. Head: normocephalic, no gross deformities. Eyes: no gross deformities noted. ENT: ears appear grossly normal no exudates. Neck: Supple. No thyromegaly. No LAD. Respiratory: no rhonci noted. Cardiovascular: Normal S1 and S2 without murmur or rub. Extremities: No cyanosis. pulses are equal. Neurologic: Alert and oriented. No involuntary movements.  LABS: Recent Results (from the past 2160 hour(s))  Lipid panel     Status: Abnormal   Collection Time: 01/10/18 10:41 AM  Result Value Ref Range   Cholesterol, Total 151 100 - 199 mg/dL   Triglycerides 286 (H) 0 - 149 mg/dL   HDL 38 (L) >39 mg/dL   VLDL Cholesterol Cal 57 (H) 5 - 40 mg/dL   LDL Calculated 56 0 - 99 mg/dL   Chol/HDL Ratio 4.0 0.0 - 5.0 ratio    Comment:                                   T. Chol/HDL Ratio                                             Men  Women                               1/2 Avg.Risk  3.4    3.3                                   Avg.Risk  5.0    4.4                                2X Avg.Risk  9.6    7.1                                3X Avg.Risk 23.4   11.0   Hemoglobin A1c     Status: Abnormal   Collection Time: 01/10/18 10:41 AM  Result Value Ref Range   Hgb A1c MFr Bld 6.8 (H) 4.8 - 5.6 %    Comment:          Prediabetes: 5.7 - 6.4          Diabetes: >6.4          Glycemic control for adults with diabetes: <7.0    Est. average glucose Bld gHb Est-mCnc 148 mg/dL  PSA     Status: None   Collection Time: 01/10/18 10:41 AM  Result Value Ref Range   Prostate Specific Ag, Serum 1.3 0.0 - 4.0 ng/mL    Comment: Roche ECLIA methodology. According to the American Urological Association, Serum PSA should decrease and remain at undetectable levels after radical prostatectomy. The AUA defines biochemical recurrence as an initial PSA value 0.2 ng/mL or greater followed by a subsequent confirmatory PSA value 0.2 ng/mL or greater. Values obtained with different assay  methods or kits cannot be used interchangeably. Results cannot be interpreted as absolute evidence of the presence or absence of malignant disease.  Radiology: Dg Chest 2 View  Result Date: 12/12/2017 CLINICAL DATA:  Acute onset shortness of breath. Cough. Coronary artery disease. EXAM: CHEST  2 VIEW COMPARISON:  11/08/2017 FINDINGS: The heart size and mediastinal contours are within normal limits. Prior CABG. Coarsening of interstitial markings is stable. No evidence of acute infiltrate or edema. No evidence of pleural effusion. The visualized skeletal structures are unremarkable. IMPRESSION: No active cardiopulmonary disease. Electronically Signed   By: Earle Gell M.D.   On: 12/12/2017 21:49   US Scrotum  Result Date: 12/12/2017 CLINICAL DATA:  Right testicular pain and swelling for 1 day. Vasectomy 35 years ago. EXAM: SCROTAL ULTRASOUND DOPPLER ULTRASOUND OF THE TESTICLES TECHNIQUE: Complete ultrasound examination of the testicles, epididymis, and other scrotal structures was performed. Color and spectral Doppler ultrasound were also utilized to evaluate blood flow to the testicles. COMPARISON:  None. FINDINGS: Right testicle Measurements: 3.8 x 2.5 x 2.7 cm. No mass or microlithiasis visualized. Left testicle Measurements: 3.9 x 2.4 x 2.4 cm. No mass or microlithiasis visualized. Right epididymis:  Normal in size and appearance. Left epididymis:  Small epididymal cyst or spermatocele. Hydrocele:  None visualized. Varicocele:  None visualized. Pulsed Doppler interrogation of both testes demonstrates normal low resistance arterial and venous waveforms bilaterally. Homogeneous normal symmetrical flow to both testicles on color flow Doppler imaging. IMPRESSION: Normal ultrasound appearance of the testicles. No evidence of testicular mass, torsion, or inflammatory process. Electronically Signed   By: Lucienne Capers M.D.   On: 12/12/2017 22:41   [place] Scrotom Doppler  Result Date:  12/12/2017 CLINICAL DATA:  Right testicular pain and swelling for 1 day. Vasectomy 35 years ago. EXAM: SCROTAL ULTRASOUND DOPPLER ULTRASOUND OF THE TESTICLES TECHNIQUE: Complete ultrasound examination of the testicles, epididymis, and other scrotal structures was performed. Color and spectral Doppler ultrasound were also utilized to evaluate blood flow to the testicles. COMPARISON:  None. FINDINGS: Right testicle Measurements: 3.8 x 2.5 x 2.7 cm. No mass or microlithiasis visualized. Left testicle Measurements: 3.9 x 2.4 x 2.4 cm. No mass or microlithiasis visualized. Right epididymis:  Normal in size and appearance. Left epididymis:  Small epididymal cyst or spermatocele. Hydrocele:  None visualized. Varicocele:  None visualized. Pulsed Doppler interrogation of both testes demonstrates normal low resistance arterial and venous waveforms bilaterally. Homogeneous normal symmetrical flow to both testicles on color flow Doppler imaging. IMPRESSION: Normal ultrasound appearance of the testicles. No evidence of testicular mass, torsion, or inflammatory process. Electronically Signed   By: Lucienne Capers M.D.   On: 12/12/2017 22:41    No results found.  No results found.    Assessment and Plan: Patient Active Problem List   Diagnosis Date Noted  . Abdominal pain, chronic, epigastric 11/06/2017  . Bilateral flank pain 03/24/2017  . Dyspnea 04/03/2016  . Hypotension 04/03/2016  . Chronic kidney disease 04/03/2016  . Anemia 04/03/2016  . Paroxysmal atrial fibrillation ([place]) 12/23/2015  . OSA (obstructive sleep apnea) 12/10/2015  . Left inguinal hernia 11/07/2015  . Anxiety 11/07/2015  . Unstable angina ([place]) 10/05/2015  . Back pain with left-sided radiculopathy 09/30/2015  . Nocturnal hypoxia 09/06/2015  . BPH (benign prostatic hyperplasia) 08/01/2015  . Chronic diastolic CHF (congestive heart failure) ([place])   . Angina pectoris ([place])   . Chest pain 07/11/2015  . COPD (chronic obstructive  pulmonary disease) ([place]) 07/03/2015  . CAD (coronary artery disease) 06/26/2015  . HTN (hypertension) 06/26/2015  . DM (diabetes mellitus) ([place]) 06/26/2015  . Achalasia 07/24/2014  . GERD (gastroesophageal reflux disease) 06/07/2014  .  Former tobacco use 04/11/2013  . HLD (hyperlipidemia) 04/09/2013    1. COPD moderate disease we will continue with present management.  Needs to continue using his inhalers in continue using his oxygen as has been prescribed.  It was risk for postoperative pulmonary complications is moderately increased 2. Chronic Respiratory failure with hypoxia continue with oxygen therapy will continue with supportive care 3. Oxygen dependence currently on 2 L flow will continue on the present flow rate 4. Surgical Clearence as noted above patient is at moderately increased risk for postoperative pulmonary complications secondary to his underlying COPD and oxygen dependence in chronic respiratory failure.  In this situation the risks and benefits should be considered when determining whether to proceed with the procedure.  General Counseling: I have discussed the findings of the evaluation and examination with Ervan.  I have also discussed any further diagnostic evaluation thatmay be needed or ordered today. Salvador verbalizes understanding of the findings of todays visit. We also reviewed his medications today and discussed drug interactions and side effects including but not limited excessive drowsiness and altered mental states. We also discussed that there is always a risk not just to him but also people around him. he has been encouraged to call the office with any questions or concerns that should arise related to todays visit.    Time spent: 54mn  I have personally obtained a history, examined the patient, evaluated laboratory and imaging results, formulated the assessment and plan and placed orders.    SAllyne Gee MD [facility]  [facility]

## 2018-03-21 ENCOUNTER — Encounter: Payer: Self-pay | Admitting: Emergency Medicine

## 2018-03-21 ENCOUNTER — Emergency Department
Admission: EM | Admit: 2018-03-21 | Discharge: 2018-03-21 | Disposition: A | Discharge status: Home / Self Care | Payer: Medicare HMO | Attending: Emergency Medicine | Admitting: Emergency Medicine

## 2018-03-21 ENCOUNTER — Emergency Department: Payer: Medicare HMO

## 2018-03-21 DIAGNOSIS — Y999 Unspecified external cause status: Secondary | ICD-10-CM | POA: Insufficient documentation

## 2018-03-21 DIAGNOSIS — I5032 Chronic diastolic (congestive) heart failure: Secondary | ICD-10-CM | POA: Insufficient documentation

## 2018-03-21 DIAGNOSIS — I130 Hypertensive heart and chronic kidney disease with heart failure and stage 1 through stage 4 chronic kidney disease, or unspecified chronic kidney disease: Secondary | ICD-10-CM | POA: Diagnosis not present

## 2018-03-21 DIAGNOSIS — N189 Chronic kidney disease, unspecified: Secondary | ICD-10-CM | POA: Diagnosis not present

## 2018-03-21 DIAGNOSIS — J449 Chronic obstructive pulmonary disease, unspecified: Secondary | ICD-10-CM | POA: Diagnosis not present

## 2018-03-21 DIAGNOSIS — S99921A Unspecified injury of right foot, initial encounter: Secondary | ICD-10-CM | POA: Diagnosis not present

## 2018-03-21 DIAGNOSIS — Y9289 Other specified places as the place of occurrence of the external cause: Secondary | ICD-10-CM | POA: Diagnosis not present

## 2018-03-21 DIAGNOSIS — Z79899 Other long term (current) drug therapy: Secondary | ICD-10-CM | POA: Diagnosis not present

## 2018-03-21 DIAGNOSIS — Z7984 Long term (current) use of oral hypoglycemic drugs: Secondary | ICD-10-CM | POA: Diagnosis not present

## 2018-03-21 DIAGNOSIS — Y9301 Activity, walking, marching and hiking: Secondary | ICD-10-CM | POA: Diagnosis not present

## 2018-03-21 DIAGNOSIS — E119 Type 2 diabetes mellitus without complications: Secondary | ICD-10-CM | POA: Insufficient documentation

## 2018-03-21 DIAGNOSIS — W1842XA Slipping, tripping and stumbling without falling due to stepping into hole or opening, initial encounter: Secondary | ICD-10-CM | POA: Diagnosis not present

## 2018-03-21 DIAGNOSIS — Z7982 Long term (current) use of aspirin: Secondary | ICD-10-CM | POA: Insufficient documentation

## 2018-03-21 DIAGNOSIS — S93601A Unspecified sprain of right foot, initial encounter: Secondary | ICD-10-CM

## 2018-03-21 DIAGNOSIS — Z87891 Personal history of nicotine dependence: Secondary | ICD-10-CM | POA: Insufficient documentation

## 2018-03-21 DIAGNOSIS — S93691A Other sprain of right foot, initial encounter: Secondary | ICD-10-CM | POA: Insufficient documentation

## 2018-03-21 NOTE — ED Provider Notes (Signed)
[facility] Emergency Department Provider Note  ____________________________________________  Time seen: Approximately 3:35 PM  I have reviewed the triage vital signs and the nursing notes.   HISTORY  Chief Complaint Foot Injury    HPI Lawrence [name] is a 62 y.o. male who presents emergency department complaining of right foot pain and "butt bone pain."  Patient reports that he was walking, stepped into a hole, injuring his right foot.  Patient reports that this caused him to fall onto his buttocks.  Patient reports that the pain is sharp through the midfoot region causing him to be unable to bear weight on the forefoot.  Patient is able to bear weight on the heel for ambulation.  Any movement or weightbearing increases pain.  Patient is also complaining of tailbone pain.  Patient reports that he sat down hard during the fall.  Patient reports that this is minimal more of a "ache" than a true pain.  Patient denies any lower back pain, other extremity pain.  Patient did not hit his head or lose consciousness.  Patient has a significant history of A. fib, CAD, CHF, celiac's, dysrhythmias, hypertension, sleep apnea, COPD, diabetes.  Patient denies any complaints with ongoing medical problems.  Patient is on chronic oxygen therapy and is currently on oxygen.  Past Medical History:  Diagnosis Date  . A-fib ([place])   . Anemia   . Anxiety   . Asthma   . CAD (coronary artery disease)    a. 2002 CABGx2 (LIMA->LAD, VG->VG->OM1);  b. 09/2012 DES->OM;  c. 03/2015 PTCA of LAD [facility]) in setting of atretic LIMA; d. 05/2015 Cath [facility]): nonobs dzs; e. 06/2015 Cath ([facility]): LM nl, LAD 45p/d ISR, 50d, D1/2 small, LCX 50p/d ISR, OM1 70ost, 30 ISR, VG->OM1 50ost, 33m LIMA->LAD 99p/d - atretic, RCA dom, nl; f.cath 10/16: 40-50%(FFR 0.90) pLAD, 75% (FFR 0.77) mLAD s/p PCI/DES, oRCA 40% (FFR0.95)  . Celiac disease   . Chronic diastolic CHF (congestive heart failure) ([place]    a. 06/2009 Echo:  EF 60-65%, Gr 1 DD, triv AI, mildly dil LA, nl RV.  .Marland KitchenCOPD (chronic obstructive pulmonary disease) ([place]    a. Chronic bronchitis and emphysema.  .Marland KitchenDysrhythmia   . Essential hypertension   . History of tobacco abuse    a. Quit 2014.  .Marland KitchenPSVT (paroxysmal supraventricular tachycardia) ([place]    a. 10/2012 Noted on Zio Patch.  . Type II diabetes mellitus ([facility]     Patient Active Problem List   Diagnosis Date Noted  . Abdominal pain, chronic, epigastric 11/06/2017  . Bilateral flank pain 03/24/2017  . Dyspnea 04/03/2016  . Hypotension 04/03/2016  . Chronic kidney disease 04/03/2016  . Anemia 04/03/2016  . Paroxysmal atrial fibrillation ([place] 12/23/2015  . OSA (obstructive sleep apnea) 12/10/2015  . Left inguinal hernia 11/07/2015  . Anxiety 11/07/2015  . Unstable angina ([place] 10/05/2015  . Back pain with left-sided radiculopathy 09/30/2015  . Nocturnal hypoxia 09/06/2015  . BPH (benign prostatic hyperplasia) 08/01/2015  . Chronic diastolic CHF (congestive heart failure) ([place]   . Angina pectoris ([place]   . Chest pain 07/11/2015  . COPD (chronic obstructive pulmonary disease) ([place] 07/03/2015  . CAD (coronary artery disease) 06/26/2015  . HTN (hypertension) 06/26/2015  . DM (diabetes mellitus) ([place] 06/26/2015  . Achalasia 07/24/2014  . GERD (gastroesophageal reflux disease) 06/07/2014  . Former tobacco use 04/11/2013  . HLD (hyperlipidemia) 04/09/2013    Past Surgical History:  Procedure Laterality Date  . BYPASS GRAFT    .  CARDIAC CATHETERIZATION N/A 07/12/2015   rocedure: Left Heart Cath and Cors/Grafts Angiography;  Surgeon: Belva Crome, MD;  Location: [place] CV LAB;  Service: Cardiovascular;  Laterality: N/A;  . CARDIAC CATHETERIZATION Right 10/07/2015   Procedure: Left Heart Cath and Cors/Grafts Angiography;  Surgeon: Dionisio David, MD;  Location: [place] CV LAB;  Service: Cardiovascular;  Laterality: Right;  . CARDIAC CATHETERIZATION N/A 04/06/2016    Procedure: Left Heart Cath and Coronary Angiography;  Surgeon: Yolonda Kida, MD;  Location: [place] CV LAB;  Service: Cardiovascular;  Laterality: N/A;  . CARDIAC CATHETERIZATION  04/06/2016   Procedure: Bypass Graft Angiography;  Surgeon: Yolonda Kida, MD;  Location: [place] CV LAB;  Service: Cardiovascular;;  . CARDIAC CATHETERIZATION N/A 11/02/2016   Procedure: Left Heart Cath and Cors/Grafts Angiography and possible PCI;  Surgeon: Yolonda Kida, MD;  Location: [place] CV LAB;  Service: Cardiovascular;  Laterality: N/A;  . CARDIAC CATHETERIZATION N/A 11/02/2016   Procedure: Coronary Stent Intervention;  Surgeon: Yolonda Kida, MD;  Location: [place] CV LAB;  Service: Cardiovascular;  Laterality: N/A;  . CHOLECYSTECTOMY    . ESOPHAGEAL DILATION    . TONSILLECTOMY    . VASCULAR SURGERY      Prior to Admission medications   Medication Sig Start Date End Date Taking? Authorizing Provider  albuterol (PROVENTIL HFA;VENTOLIN HFA) 108 (90 Base) MCG/ACT inhaler Inhale 2 puffs into the lungs every 4 (four) hours as needed for wheezing or shortness of breath. Reported on 04/08/2016 10/21/17   Birdie Sons, MD  albuterol (PROVENTIL) (2.5 MG/3ML) 0.083% nebulizer solution Take 3 mLs (2.5 mg total) by nebulization every 6 (six) hours as needed for wheezing or shortness of breath. 01/04/18   Birdie Sons, MD  allopurinol (ZYLOPRIM) 100 MG tablet TAKE 3 TABLETS BY MOUTH EVERY DAY. 03/11/18   Birdie Sons, MD  ALPRAZolam Duanne Moron) 1 MG tablet TAKE ONE-HALF TO ONE TABLET BY MOUTH THREE TIMES DAILY AS NEEDED FOR ANXIETY 09/28/17   Birdie Sons, MD  aspirin 81 MG chewable tablet Chew 81 mg by mouth daily.    [provider]  atorvastatin (LIPITOR) 80 MG tablet TAKE ONE TABLET BY MOUTH EVERY NIGHT AT BEDTIME 08/13/17   Birdie Sons, MD  BANOPHEN 25 MG capsule TAKE 1 CAPSULE (25MG TOTAL) BY MOUTH EVERY 4 (FOUR) HOURS AS NEEDED. 07/14/17   Birdie Sons, MD  Blood Glucose Monitoring Suppl (ACCU-CHEK AVIVA PLUS) w/Device KIT CHECK BLOOD SUGAR EVERY DAY 12/08/17   Birdie Sons, MD  cetirizine (ZYRTEC) 10 MG tablet TAKE ONE TABLET BY MOUTH AT BEDTIME. Patient taking differently: 1-2 tid prn 05/12/17   Birdie Sons, MD  cyclobenzaprine (FLEXERIL) 5 MG tablet TAKE 1 TO 2 TABLETS BY MOUTH THREE TIMESDAILY AS NEEDED (BACK PAIN) 11/15/17   Birdie Sons, MD  docusate sodium (COLACE) 100 MG capsule Take 100 mg by mouth daily.     [provider]  ELIQUIS 5 MG TABS tablet TAKE 1 TABLET BY MOUTH TWICE A DAY. 07/28/17   Birdie Sons, MD  fluticasone furoate-vilanterol (BREO ELLIPTA) 100-25 MCG/INH AEPB Inhale 1 puff into the lungs daily. 01/18/18   Devona Konig A, MD  glucose blood (ACCU-CHEK AVIVA PLUS) test strip Use to check blood sugar once a day 02/14/18   Birdie Sons, MD  hydrocortisone 1 % ointment Apply 1 application topically 2 (two) times daily. Apply to rash 06/19/17   [name], MD  isosorbide mononitrate (IMDUR) 60 MG 24 hr tablet Take 1 tablet (60 mg total) by mouth daily. Patient taking differently: Take 60 mg by mouth 2 (two) times daily.  11/04/16   Henreitta Leber, MD  lansoprazole (PREVACID) 30 MG capsule Take 30 mg by mouth 2 (two) times daily.     [provider]  lisinopril (PRINIVIL,ZESTRIL) 2.5 MG tablet TAKE 1 TABLET BY MOUTH EVERY DAY 03/11/18   Birdie Sons, MD  Magnesium Oxide 400 (240 Mg) MG TABS TAKE ONE TABLET BY MOUTH DAILY 03/23/17   Birdie Sons, MD  metFORMIN (GLUCOPHAGE) 500 MG tablet TAKE 1 TABLET BY MOUTH EVERY MORNING 11/30/17   Birdie Sons, MD  nitroGLYCERIN (NITROSTAT) 0.4 MG SL tablet Place 1 tablet (0.4 mg total) under the tongue every 5 (five) minutes as needed for chest pain. Reported on 05/26/2016 10/06/16   Birdie Sons, MD  omega-3 acid ethyl esters (LOVAZA) 1 g capsule TAKE FOUR CAPSULES BY MOUTH DAILY 06/04/17   Birdie Sons, MD  ondansetron (ZOFRAN) 4 MG  tablet Take 1 tablet (4 mg total) by mouth every 8 (eight) hours as needed for nausea or vomiting. 11/08/17   Schuyler Amor, MD  oxyCODONE-acetaminophen (PERCOCET) 10-325 MG tablet Take 1 tablet by mouth every 4 (four) hours as needed. 02/22/18   Birdie Sons, MD  pantoprazole (PROTONIX) 40 MG tablet Take 40 mg by mouth daily.    [provider]  potassium chloride SA (K-DUR,KLOR-CON) 20 MEQ tablet  08/13/17   [provider]  promethazine (PHENERGAN) 25 MG tablet Take 1 tablet (25 mg total) by mouth every 8 (eight) hours as needed for nausea or vomiting. 01/24/18   Birdie Sons, MD  RANEXA 500 MG 12 hr tablet Take 1,000 mg by mouth 2 (two) times daily.    [provider]  sotalol (BETAPACE) 120 MG tablet Take 120 mg by mouth 2 (two) times daily.     [provider]  SPIRIVA HANDIHALER 18 MCG inhalation capsule INHALE ONE CAPSULE AS DIRECTED ONCE A DAY 02/03/18   Birdie Sons, MD  sucralfate (CARAFATE) 1 g tablet Take 1 tablet (1 g total) by mouth 2 (two) times daily. 01/04/18   Birdie Sons, MD  tamsulosin (FLOMAX) 0.4 MG CAPS capsule Take by mouth.    [provider]  torsemide (DEMADEX) 100 MG tablet Take 0.5 tablets by mouth daily. 10/11/17   [provider]  traMADol (ULTRAM) 50 MG tablet Take 1 tablet (50 mg total) by mouth every 6 (six) hours as needed. 12/12/17 12/12/18  Lavonia Drafts, MD    Allergies Demerol [meperidine]; Prednisone; Sulfa antibiotics; Albuterol sulfate [albuterol]; and Morphine sulfate  Family History  Problem Relation Age of Onset  . Heart attack Mother   . Depression Mother   . Heart disease Mother   . COPD Mother   . Hypertension Mother   . Heart attack Father   . Diabetes Father   . Depression Father   . Heart disease Father   . Cirrhosis Father   . Parkinson's disease Brother     Social History Social History   Tobacco Use  . Smoking status: Former Smoker    Last attempt to quit:  04/22/2013    Years since quitting: 4.9  . Smokeless tobacco: Never Used  Substance Use Topics  . Alcohol use: No    Comment: remotely quit alcohol use. Hx of heavy alcohol use.  . Drug use: No  Review of Systems  Constitutional: No fever/chills Eyes: No visual changes Cardiovascular: no chest pain. Respiratory: no cough. No SOB. Gastrointestinal: No abdominal pain.  No nausea, no vomiting.   Musculoskeletal: Positive for right foot pain, coccyx pain. Skin: Negative for rash, abrasions, lacerations, ecchymosis. Neurological: Negative for headaches, focal weakness or numbness. 10-point ROS otherwise negative.  ____________________________________________   PHYSICAL EXAM:  VITAL SIGNS: ED Triage Vitals  Enc Vitals Group     BP 03/21/18 1507 (!) 147/83     Pulse Rate 03/21/18 1507 70     Resp 03/21/18 1507 18     Temp 03/21/18 1507 97.9 F (36.6 C)     Temp Source 03/21/18 1507 Oral     SpO2 03/21/18 1507 95 %     Weight 03/21/18 1509 245 lb (111.1 kg)     Height 03/21/18 1509 5' 7"  (1.702 m)     Head Circumference --      Peak Flow --      Pain Score 03/21/18 1508 9     Pain Loc --      Pain Edu? --      Excl. in [place]? --      Constitutional: Alert and oriented. Well appearing and in no acute distress. Eyes: Conjunctivae are normal. PERRL. EOMI. Head: Atraumatic. Neck: No stridor.    Cardiovascular: Normal rate, regular rhythm. Normal S1 and S2.  Good peripheral circulation. Respiratory: Normal respiratory effort without tachypnea or retractions. Lungs CTAB. Good air entry to the bases with no decreased or absent breath sounds. Gastrointestinal: Bowel sounds 4 quadrants. Soft and nontender to palpation. No guarding or rigidity. No palpable masses. No distention. No CVA tenderness. Musculoskeletal: Full range of motion to all extremities. No gross deformities appreciated.  No deformity, ecchymosis, abrasions or lacerations noted to the coccyx.  Diffuse tenderness.   No tenderness to palpation in the lumbar spine.  Visualization of the right foot reveals no gross erythema, edema, ecchymosis, abrasions or lacerations.  Patient has good range of motion to all digits, right ankle.  Patient is diffusely tender to palpation mid metatarsal range.  No palpable abnormality.  Dorsalis pedis pulse intact.  Sensation intact all 5 digits. Neurologic:  Normal speech and language. No gross focal neurologic deficits are appreciated.  Skin:  Skin is warm, dry and intact. No rash noted. Psychiatric: Mood and affect are normal. Speech and behavior are normal. Patient exhibits appropriate insight and judgement.   ____________________________________________   LABS (all labs ordered are listed, but only abnormal results are displayed)  Labs Reviewed - No data to display ____________________________________________  EKG   ____________________________________________  RADIOLOGY Diamantina Providence Srihitha Tagliaferri, personally viewed and evaluated these images (plain radiographs) as part of my medical decision making, as well as reviewing the written report by the radiologist.  I concur with radiologist of no acute fractures to the right foot.  Dg Foot Complete Right  Result Date: 03/21/2018 CLINICAL DATA:  Stepped in hole today injuring right foot EXAM: RIGHT FOOT COMPLETE - 3+ VIEW COMPARISON:  None. FINDINGS: Tarsal-metatarsal alignment is normal. There is only mild degenerative joint disease of the right first MTP joint. No acute fracture is seen. Alignment is normal. Only a small plantar calcaneal degenerative spur is present. IMPRESSION: 1. No acute fracture. 2. Mild degenerative change of the right first MTP joint and small plantar calcaneal degenerative spur. Electronically Signed   By: Ivar Drape M.D.   On: 03/21/2018 16:02    ____________________________________________    PROCEDURES  Procedure(s) performed:    Procedures    Medications - No data to  display   ____________________________________________   INITIAL IMPRESSION / ASSESSMENT AND PLAN / ED COURSE  Pertinent labs & imaging results that were available during my care of the patient were reviewed by me and considered in my medical decision making (see chart for details).  Review of the [place] CSRS was performed in accordance of the [place] prior to dispensing any controlled drugs.     Patient's diagnosis is consistent with right foot sprain.  Patient presents emergency department complaining of mid foot pain after stepping into a hole.  X-ray reveals no acute osseous abnormality.  Exam is reassuring.  No indication for further work-up..  Patient is given postop boot and Ace bandage in the emergency department.  He is instructed to use a cane at home to take partial weightbearing on the right foot.  Tylenol and Motrin at home as needed for pain.  Patient is given ED precautions to return to the ED for any worsening or new symptoms.     ____________________________________________  FINAL CLINICAL IMPRESSION(S) / ED DIAGNOSES  Final diagnoses:  Sprain of right foot, initial encounter      NEW MEDICATIONS STARTED DURING THIS VISIT:  ED Discharge Orders    None          This chart was dictated using voice recognition software/Dragon. Despite best efforts to proofread, errors can occur which can change the meaning. Any change was purely unintentional.    Darletta Moll, PA-C 03/21/18 1619    Hinda Kehr, MD 03/21/18 [phone]

## 2018-03-21 NOTE — ED Triage Notes (Signed)
Pt arrived with wife after mechanical fall that resulted in injury to patient's right leg. Pt denies hitting head or any LOC. Pt states the fall took place at 0900 today. Pt is able to move leg but states the pain increases with movement.

## 2018-03-21 NOTE — ED Notes (Signed)
See triage note  States he stepped in hole this am  [address]   Having pain to tailbone and right foot  No swelling noted  Area tender to touch  Good pulses

## 2018-03-22 ENCOUNTER — Other Ambulatory Visit: Payer: Self-pay | Admitting: Family Medicine

## 2018-03-22 DIAGNOSIS — J441 Chronic obstructive pulmonary disease with (acute) exacerbation: Secondary | ICD-10-CM

## 2018-03-22 MED ORDER — OXYCODONE-ACETAMINOPHEN 10-325 MG PO TABS: 1.0000 | ORAL_TABLET | ORAL | 0 refills | Status: DC | PRN

## 2018-03-22 NOTE — Telephone Encounter (Signed)
Pt contacted office for refill request on the following medications:  oxyCODONE-acetaminophen (PERCOCET) 10-325 MG tablet  [facility]  Last Rx: 02/22/18 LOV: 01/24/18 Please advise. Thanks TNP

## 2018-03-24 ENCOUNTER — Other Ambulatory Visit: Payer: Self-pay | Admitting: Family Medicine

## 2018-03-24 DIAGNOSIS — F419 Anxiety disorder, unspecified: Secondary | ICD-10-CM

## 2018-03-25 ENCOUNTER — Encounter: Payer: Self-pay | Admitting: Emergency Medicine

## 2018-03-25 ENCOUNTER — Emergency Department
Admission: EM | Admit: 2018-03-25 | Discharge: 2018-03-25 | Disposition: A | Discharge status: Home / Self Care | Payer: Medicare HMO | Attending: Emergency Medicine | Admitting: Emergency Medicine

## 2018-03-25 DIAGNOSIS — I130 Hypertensive heart and chronic kidney disease with heart failure and stage 1 through stage 4 chronic kidney disease, or unspecified chronic kidney disease: Secondary | ICD-10-CM | POA: Insufficient documentation

## 2018-03-25 DIAGNOSIS — E1122 Type 2 diabetes mellitus with diabetic chronic kidney disease: Secondary | ICD-10-CM | POA: Insufficient documentation

## 2018-03-25 DIAGNOSIS — Z87891 Personal history of nicotine dependence: Secondary | ICD-10-CM | POA: Insufficient documentation

## 2018-03-25 DIAGNOSIS — Z9049 Acquired absence of other specified parts of digestive tract: Secondary | ICD-10-CM | POA: Insufficient documentation

## 2018-03-25 DIAGNOSIS — H00014 Hordeolum externum left upper eyelid: Secondary | ICD-10-CM

## 2018-03-25 DIAGNOSIS — I5032 Chronic diastolic (congestive) heart failure: Secondary | ICD-10-CM | POA: Insufficient documentation

## 2018-03-25 DIAGNOSIS — Z79899 Other long term (current) drug therapy: Secondary | ICD-10-CM | POA: Insufficient documentation

## 2018-03-25 DIAGNOSIS — Z7984 Long term (current) use of oral hypoglycemic drugs: Secondary | ICD-10-CM | POA: Diagnosis not present

## 2018-03-25 DIAGNOSIS — Z7982 Long term (current) use of aspirin: Secondary | ICD-10-CM | POA: Diagnosis not present

## 2018-03-25 DIAGNOSIS — J45909 Unspecified asthma, uncomplicated: Secondary | ICD-10-CM | POA: Diagnosis not present

## 2018-03-25 DIAGNOSIS — F419 Anxiety disorder, unspecified: Secondary | ICD-10-CM | POA: Insufficient documentation

## 2018-03-25 DIAGNOSIS — I2510 Atherosclerotic heart disease of native coronary artery without angina pectoris: Secondary | ICD-10-CM | POA: Insufficient documentation

## 2018-03-25 DIAGNOSIS — N189 Chronic kidney disease, unspecified: Secondary | ICD-10-CM | POA: Insufficient documentation

## 2018-03-25 DIAGNOSIS — H02844 Edema of left upper eyelid: Secondary | ICD-10-CM | POA: Diagnosis present

## 2018-03-25 MED ORDER — CEPHALEXIN 500 MG PO CAPS: 500.0000 mg | ORAL_CAPSULE | Freq: Four times a day (QID) | ORAL | 0 refills | Status: AC

## 2018-03-25 MED ORDER — HYDROXYZINE HCL 50 MG PO TABS: 50.0000 mg | ORAL_TABLET | Freq: Three times a day (TID) | ORAL | 0 refills | Status: DC | PRN

## 2018-03-25 MED ORDER — GENTAMICIN SULFATE 0.3 % OP SOLN: 1.0000 [drp] | OPHTHALMIC | 0 refills | Status: DC

## 2018-03-25 NOTE — ED Provider Notes (Signed)
[facility] Emergency Department Provider Note   ____________________________________________   First MD Initiated Contact with Patient 03/25/18 1159     (approximate)  I have reviewed the triage vital signs and the nursing notes.   HISTORY  Chief Complaint Eye Pain    HPI Lawrence [name] is a 62 y.o. male patient complain of left upper and lower eyelid swelling.  Patient states he developed a stye in the upper eyelid 3 days ago.  Patient stated apply warm compresses and believe to stye ruptured and he had a purulent drainage to the eye.  Patient he woke up this morning with edema to the lower eyelid.  Patient state patient still with only secondary to drainage.  Patient rates the pain is 8/10.  Patient described the pain is "achy".  No palliative measures for complaint.  Past Medical History:  Diagnosis Date  . A-fib ([place])   . Anemia   . Anxiety   . Asthma   . CAD (coronary artery disease)    a. 2002 CABGx2 (LIMA->LAD, VG->VG->OM1);  b. 09/2012 DES->OM;  c. 03/2015 PTCA of LAD [facility]) in setting of atretic LIMA; d. 05/2015 Cath [facility]): nonobs dzs; e. 06/2015 Cath ([facility]): LM nl, LAD 45p/d ISR, 50d, D1/2 small, LCX 50p/d ISR, OM1 70ost, 30 ISR, VG->OM1 50ost, 30m LIMA->LAD 99p/d - atretic, RCA dom, nl; f.cath 10/16: 40-50%(FFR 0.90) pLAD, 75% (FFR 0.77) mLAD s/p PCI/DES, oRCA 40% (FFR0.95)  . Celiac disease   . Chronic diastolic CHF (congestive heart failure) ([place]    a. 06/2009 Echo: EF 60-65%, Gr 1 DD, triv AI, mildly dil LA, nl RV.  .Marland KitchenCOPD (chronic obstructive pulmonary disease) ([place]    a. Chronic bronchitis and emphysema.  .Marland KitchenDysrhythmia   . Essential hypertension   . History of tobacco abuse    a. Quit 2014.  .Marland KitchenPSVT (paroxysmal supraventricular tachycardia) ([place]    a. 10/2012 Noted on Zio Patch.  . Type II diabetes mellitus ([facility]     Patient Active Problem List   Diagnosis Date Noted  . Abdominal pain, chronic, epigastric 11/06/2017  . Bilateral  flank pain 03/24/2017  . Dyspnea 04/03/2016  . Hypotension 04/03/2016  . Chronic kidney disease 04/03/2016  . Anemia 04/03/2016  . Paroxysmal atrial fibrillation ([place] 12/23/2015  . OSA (obstructive sleep apnea) 12/10/2015  . Left inguinal hernia 11/07/2015  . Anxiety 11/07/2015  . Unstable angina ([place] 10/05/2015  . Back pain with left-sided radiculopathy 09/30/2015  . Nocturnal hypoxia 09/06/2015  . BPH (benign prostatic hyperplasia) 08/01/2015  . Chronic diastolic CHF (congestive heart failure) ([place]   . Angina pectoris ([place]   . Chest pain 07/11/2015  . COPD (chronic obstructive pulmonary disease) ([place] 07/03/2015  . CAD (coronary artery disease) 06/26/2015  . HTN (hypertension) 06/26/2015  . DM (diabetes mellitus) ([place] 06/26/2015  . Achalasia 07/24/2014  . GERD (gastroesophageal reflux disease) 06/07/2014  . Former tobacco use 04/11/2013  . HLD (hyperlipidemia) 04/09/2013    Past Surgical History:  Procedure Laterality Date  . BYPASS GRAFT    . CARDIAC CATHETERIZATION N/A 07/12/2015   rocedure: Left Heart Cath and Cors/Grafts Angiography;  Surgeon: HBelva Crome MD;  [facility];  Service: Cardiovascular;  Laterality: N/A;  . CARDIAC CATHETERIZATION Right 10/07/2015   Procedure: Left Heart Cath and Cors/Grafts Angiography;  Surgeon: SDionisio David MD;  [place];  Service: Cardiovascular;  Laterality: Right;  . CARDIAC CATHETERIZATION N/A 04/06/2016   Procedure: Left Heart Cath  and Coronary Angiography;  Surgeon: Yolonda Kida, MD;  Location: [place] CV LAB;  Service: Cardiovascular;  Laterality: N/A;  . CARDIAC CATHETERIZATION  04/06/2016   Procedure: Bypass Graft Angiography;  Surgeon: Yolonda Kida, MD;  Location: [place] CV LAB;  Service: Cardiovascular;;  . CARDIAC CATHETERIZATION N/A 11/02/2016   Procedure: Left Heart Cath and Cors/Grafts Angiography and possible PCI;  Surgeon: Yolonda Kida, MD;  Location:  [place] CV LAB;  Service: Cardiovascular;  Laterality: N/A;  . CARDIAC CATHETERIZATION N/A 11/02/2016   Procedure: Coronary Stent Intervention;  Surgeon: Yolonda Kida, MD;  Location: [place] CV LAB;  Service: Cardiovascular;  Laterality: N/A;  . CHOLECYSTECTOMY    . ESOPHAGEAL DILATION    . TONSILLECTOMY    . VASCULAR SURGERY      Prior to Admission medications   Medication Sig Start Date End Date Taking? Authorizing Provider  albuterol (PROVENTIL HFA;VENTOLIN HFA) 108 (90 Base) MCG/ACT inhaler Inhale 2 puffs into the lungs every 4 (four) hours as needed for wheezing or shortness of breath. Reported on 04/08/2016 10/21/17   Birdie Sons, MD  albuterol (PROVENTIL) (2.5 MG/3ML) 0.083% nebulizer solution Take 3 mLs (2.5 mg total) by nebulization every 6 (six) hours as needed for wheezing or shortness of breath. 01/04/18   Birdie Sons, MD  allopurinol (ZYLOPRIM) 100 MG tablet TAKE 3 TABLETS BY MOUTH EVERY DAY. 03/11/18   Birdie Sons, MD  ALPRAZolam Duanne Moron) 1 MG tablet TAKE ONE-HALF TO ONE TABLET BY MOUTH THREE TIMES A DAY AS NEEDED 03/24/18   Birdie Sons, MD  aspirin 81 MG chewable tablet Chew 81 mg by mouth daily.    [provider]  atorvastatin (LIPITOR) 80 MG tablet TAKE ONE TABLET BY MOUTH EVERY NIGHT AT BEDTIME 08/13/17   Birdie Sons, MD  BANOPHEN 25 MG capsule TAKE 1 CAPSULE (25MG TOTAL) BY MOUTH EVERY 4 (FOUR) HOURS AS NEEDED. 07/14/17   Birdie Sons, MD  Blood Glucose Monitoring Suppl (ACCU-CHEK AVIVA PLUS) w/Device KIT CHECK BLOOD SUGAR EVERY DAY 12/08/17   Birdie Sons, MD  cephALEXin (KEFLEX) 500 MG capsule Take 1 capsule (500 mg total) by mouth 4 (four) times daily for 10 days. 03/25/18 04/04/18  Sable Feil, PA-C  cetirizine (ZYRTEC) 10 MG tablet TAKE ONE TABLET BY MOUTH AT BEDTIME. Patient taking differently: 1-2 tid prn 05/12/17   Birdie Sons, MD  cyclobenzaprine (FLEXERIL) 5 MG tablet TAKE 1 TO 2 TABLETS BY MOUTH THREE  TIMESDAILY AS NEEDED (BACK PAIN) 11/15/17   Birdie Sons, MD  docusate sodium (COLACE) 100 MG capsule Take 100 mg by mouth daily.     [provider]  ELIQUIS 5 MG TABS tablet TAKE 1 TABLET BY MOUTH TWICE A DAY. 07/28/17   Birdie Sons, MD  fluticasone furoate-vilanterol (BREO ELLIPTA) 100-25 MCG/INH AEPB Inhale 1 puff into the lungs daily. 01/18/18   Allyne Gee, MD  gentamicin (GARAMYCIN) 0.3 % ophthalmic solution Place 1 drop into the left eye every 4 (four) hours. 03/25/18   Sable Feil, PA-C  glucose blood (ACCU-CHEK AVIVA PLUS) test strip Use to check blood sugar once a day 02/14/18   Birdie Sons, MD  hydrocortisone 1 % ointment Apply 1 application topically 2 (two) times daily. Apply to rash 06/19/17   [name], MD  hydrOXYzine (ATARAX/VISTARIL) 50 MG tablet Take 1 tablet (50 mg total) by mouth 3 (three) times daily as needed. 03/25/18   Tamala Julian,  Hinda Lenis, PA-C  isosorbide mononitrate (IMDUR) 60 MG 24 hr tablet Take 1 tablet (60 mg total) by mouth daily. Patient taking differently: Take 60 mg by mouth 2 (two) times daily.  11/04/16   Henreitta Leber, MD  lansoprazole (PREVACID) 30 MG capsule Take 30 mg by mouth 2 (two) times daily.     [provider]  lisinopril (PRINIVIL,ZESTRIL) 2.5 MG tablet TAKE 1 TABLET BY MOUTH EVERY DAY 03/11/18   Birdie Sons, MD  Magnesium Oxide 400 (240 Mg) MG TABS TAKE ONE TABLET BY MOUTH DAILY 03/23/17   Birdie Sons, MD  metFORMIN (GLUCOPHAGE) 500 MG tablet TAKE 1 TABLET BY MOUTH EVERY MORNING 11/30/17   Birdie Sons, MD  nitroGLYCERIN (NITROSTAT) 0.4 MG SL tablet Place 1 tablet (0.4 mg total) under the tongue every 5 (five) minutes as needed for chest pain. Reported on 05/26/2016 10/06/16   Birdie Sons, MD  omega-3 acid ethyl esters (LOVAZA) 1 g capsule TAKE FOUR CAPSULES BY MOUTH DAILY 06/04/17   Birdie Sons, MD  ondansetron (ZOFRAN) 4 MG tablet Take 1 tablet (4 mg total) by mouth every 8 (eight) hours as  needed for nausea or vomiting. 11/08/17   Schuyler Amor, MD  oxyCODONE-acetaminophen (PERCOCET) 10-325 MG tablet Take 1 tablet by mouth every 4 (four) hours as needed. 03/22/18   Birdie Sons, MD  pantoprazole (PROTONIX) 40 MG tablet Take 40 mg by mouth daily.    [provider]  potassium chloride SA (K-DUR,KLOR-CON) 20 MEQ tablet  08/13/17   [provider]  promethazine (PHENERGAN) 25 MG tablet Take 1 tablet (25 mg total) by mouth every 8 (eight) hours as needed for nausea or vomiting. 01/24/18   Birdie Sons, MD  RANEXA 500 MG 12 hr tablet Take 1,000 mg by mouth 2 (two) times daily.    [provider]  sotalol (BETAPACE) 120 MG tablet Take 120 mg by mouth 2 (two) times daily.     [provider]  SPIRIVA HANDIHALER 18 MCG inhalation capsule INHALE ONE CAPSULE AS DIRECTED ONCE A DAY 02/03/18   Birdie Sons, MD  sucralfate (CARAFATE) 1 g tablet Take 1 tablet (1 g total) by mouth 2 (two) times daily. 01/04/18   Birdie Sons, MD  tamsulosin (FLOMAX) 0.4 MG CAPS capsule Take by mouth.    [provider]  torsemide (DEMADEX) 100 MG tablet Take 0.5 tablets by mouth daily. 10/11/17   [provider]  traMADol (ULTRAM) 50 MG tablet Take 1 tablet (50 mg total) by mouth every 6 (six) hours as needed. 12/12/17 12/12/18  Lavonia Drafts, MD    Allergies Demerol [meperidine]; Prednisone; Sulfa antibiotics; Albuterol sulfate [albuterol]; and Morphine sulfate  Family History  Problem Relation Age of Onset  . Heart attack Mother   . Depression Mother   . Heart disease Mother   . COPD Mother   . Hypertension Mother   . Heart attack Father   . Diabetes Father   . Depression Father   . Heart disease Father   . Cirrhosis Father   . Parkinson's disease Brother     Social History Social History   Tobacco Use  . Smoking status: Former Smoker    Last attempt to quit: 04/22/2013    Years since quitting: 4.9  . Smokeless tobacco: Never  Used  Substance Use Topics  . Alcohol use: No    Comment: remotely quit alcohol use. Hx of heavy alcohol use.  . Drug  use: No    Review of Systems Constitutional: No fever/chills Eyes: No visual changes.  Bilateral eyelid edema. ENT: No sore throat. Cardiovascular: Denies chest pain. Respiratory: Denies shortness of breath. Gastrointestinal: No abdominal pain.  No nausea, no vomiting.  No diarrhea.  No constipation. Genitourinary: Negative for dysuria. Musculoskeletal: Negative for back pain. Skin: Negative for rash. Neurological: Negative for headaches, focal weakness or numbness.   ____________________________________________   PHYSICAL EXAM:  VITAL SIGNS: ED Triage Vitals  Enc Vitals Group     BP 03/25/18 1149 138/87     Pulse Rate 03/25/18 1149 (!) 58     Resp 03/25/18 1149 18     Temp 03/25/18 1149 97.8 F (36.6 C)     Temp Source 03/25/18 1149 Oral     SpO2 03/25/18 1149 95 %     Weight 03/25/18 1118 248 lb (112.5 kg)     Height 03/25/18 1118 5' 7"  (1.702 m)     Head Circumference --      Peak Flow --      Pain Score 03/25/18 1117 8     Pain Loc --      Pain Edu? --      Excl. in [place]? --    Constitutional: Alert and oriented. Well appearing and in no acute distress. Eyes: Conjunctivae are normal. PERRL. EOMI. purulent drainage left eye. Mouth/Throat: Mucous membranes are moist.  Oropharynx non-erythematous. Neck: No stridor.  Hematological/Lymphatic/Immunilogical: No cervical lymphadenopathy. Cardiovascular: Normal rate, regular rhythm. Grossly normal heart sounds.  Good peripheral circulation. Respiratory: Normal respiratory effort.  No retractions. Lungs CTAB. Skin:  Skin is warm, dry and intact. No rash noted. Psychiatric: Mood and affect are normal. Speech and behavior are normal.  ____________________________________________   LABS (all labs ordered are listed, but only abnormal results are displayed)  Labs Reviewed - No data to  display ____________________________________________  EKG   ____________________________________________  RADIOLOGY  ED MD interpretation:    Official radiology report(s): No results found.  ____________________________________________   PROCEDURES  Procedure(s) performed: None  Procedures  Critical Care performed: No  ____________________________________________   INITIAL IMPRESSION / ASSESSMENT AND PLAN / ED COURSE  As part of my medical decision making, I reviewed the following data within the electronic MEDICAL RECORD NUMBER    Purulent drainage secondary to a ruptured stye of the left eye.  Patient given discharge care instruction advised use eyedrops and antibiotic medication as directed.  Patient advised to follow-up with the ophthalmology department if no improvement in 2 to 3 days.      ____________________________________________   FINAL CLINICAL IMPRESSION(S) / ED DIAGNOSES  Final diagnoses:  Hordeolum externum of left upper eyelid     ED Discharge Orders        Ordered    gentamicin (GARAMYCIN) 0.3 % ophthalmic solution  Every 4 hours     03/25/18 1217    cephALEXin (KEFLEX) 500 MG capsule  4 times daily     03/25/18 1217    hydrOXYzine (ATARAX/VISTARIL) 50 MG tablet  3 times daily PRN     03/25/18 1217       Note:  This document was prepared using Dragon voice recognition software and may include unintentional dictation errors.    Sable Feil, PA-C 03/25/18 [place], [place], MD 03/25/18 [phone]

## 2018-03-25 NOTE — ED Notes (Signed)
See triage note  Presents with swelling to left eye lid   Developed some pain couple of days ago

## 2018-03-25 NOTE — Discharge Instructions (Addendum)
Take medication as directed.  If no improvement in 3 days follow-up with ophthalmology.

## 2018-03-25 NOTE — ED Triage Notes (Signed)
Pt comes into the ED via POV c/o left eye pain and swelling.  Patient states it got worse last night.  Patient in NAD at this time with even and unlabored respirations.

## 2018-03-31 ENCOUNTER — Encounter: Payer: Self-pay | Admitting: *Deleted

## 2018-04-01 ENCOUNTER — Ambulatory Visit: Payer: Medicare HMO | Admitting: Anesthesiology

## 2018-04-01 ENCOUNTER — Encounter
Admission: RE | Disposition: A | Discharge status: Home / Self Care | Payer: Self-pay | Source: Ambulatory Visit | Attending: Unknown Physician Specialty

## 2018-04-01 ENCOUNTER — Ambulatory Visit
Admission: RE | Admit: 2018-04-01 | Discharge: 2018-04-01 | Disposition: A | Discharge status: Home / Self Care | Payer: Medicare HMO | Source: Ambulatory Visit | Attending: Unknown Physician Specialty | Admitting: Unknown Physician Specialty

## 2018-04-01 ENCOUNTER — Encounter: Payer: Self-pay | Admitting: *Deleted

## 2018-04-01 DIAGNOSIS — Z79899 Other long term (current) drug therapy: Secondary | ICD-10-CM | POA: Insufficient documentation

## 2018-04-01 DIAGNOSIS — Z951 Presence of aortocoronary bypass graft: Secondary | ICD-10-CM | POA: Insufficient documentation

## 2018-04-01 DIAGNOSIS — I2510 Atherosclerotic heart disease of native coronary artery without angina pectoris: Secondary | ICD-10-CM | POA: Insufficient documentation

## 2018-04-01 DIAGNOSIS — Z882 Allergy status to sulfonamides status: Secondary | ICD-10-CM | POA: Diagnosis not present

## 2018-04-01 DIAGNOSIS — E119 Type 2 diabetes mellitus without complications: Secondary | ICD-10-CM | POA: Insufficient documentation

## 2018-04-01 DIAGNOSIS — K2970 Gastritis, unspecified, without bleeding: Secondary | ICD-10-CM | POA: Diagnosis not present

## 2018-04-01 DIAGNOSIS — I130 Hypertensive heart and chronic kidney disease with heart failure and stage 1 through stage 4 chronic kidney disease, or unspecified chronic kidney disease: Secondary | ICD-10-CM | POA: Diagnosis not present

## 2018-04-01 DIAGNOSIS — D123 Benign neoplasm of transverse colon: Secondary | ICD-10-CM | POA: Insufficient documentation

## 2018-04-01 DIAGNOSIS — Z7982 Long term (current) use of aspirin: Secondary | ICD-10-CM | POA: Insufficient documentation

## 2018-04-01 DIAGNOSIS — I110 Hypertensive heart disease with heart failure: Secondary | ICD-10-CM | POA: Diagnosis not present

## 2018-04-01 DIAGNOSIS — Z980 Intestinal bypass and anastomosis status: Secondary | ICD-10-CM | POA: Diagnosis not present

## 2018-04-01 DIAGNOSIS — Z955 Presence of coronary angioplasty implant and graft: Secondary | ICD-10-CM | POA: Diagnosis not present

## 2018-04-01 DIAGNOSIS — I471 Supraventricular tachycardia: Secondary | ICD-10-CM | POA: Diagnosis not present

## 2018-04-01 DIAGNOSIS — I509 Heart failure, unspecified: Secondary | ICD-10-CM | POA: Insufficient documentation

## 2018-04-01 DIAGNOSIS — Z7984 Long term (current) use of oral hypoglycemic drugs: Secondary | ICD-10-CM | POA: Insufficient documentation

## 2018-04-01 DIAGNOSIS — F419 Anxiety disorder, unspecified: Secondary | ICD-10-CM | POA: Insufficient documentation

## 2018-04-01 DIAGNOSIS — M1990 Unspecified osteoarthritis, unspecified site: Secondary | ICD-10-CM | POA: Insufficient documentation

## 2018-04-01 DIAGNOSIS — K5790 Diverticulosis of intestine, part unspecified, without perforation or abscess without bleeding: Secondary | ICD-10-CM | POA: Diagnosis not present

## 2018-04-01 DIAGNOSIS — E1122 Type 2 diabetes mellitus with diabetic chronic kidney disease: Secondary | ICD-10-CM | POA: Diagnosis not present

## 2018-04-01 DIAGNOSIS — Z86010 Personal history of colonic polyps: Secondary | ICD-10-CM | POA: Insufficient documentation

## 2018-04-01 DIAGNOSIS — J449 Chronic obstructive pulmonary disease, unspecified: Secondary | ICD-10-CM | POA: Diagnosis not present

## 2018-04-01 DIAGNOSIS — K219 Gastro-esophageal reflux disease without esophagitis: Secondary | ICD-10-CM | POA: Insufficient documentation

## 2018-04-01 DIAGNOSIS — Z7901 Long term (current) use of anticoagulants: Secondary | ICD-10-CM | POA: Insufficient documentation

## 2018-04-01 DIAGNOSIS — K2950 Unspecified chronic gastritis without bleeding: Secondary | ICD-10-CM | POA: Diagnosis not present

## 2018-04-01 DIAGNOSIS — K900 Celiac disease: Secondary | ICD-10-CM | POA: Insufficient documentation

## 2018-04-01 DIAGNOSIS — N189 Chronic kidney disease, unspecified: Secondary | ICD-10-CM | POA: Diagnosis not present

## 2018-04-01 DIAGNOSIS — K621 Rectal polyp: Secondary | ICD-10-CM | POA: Diagnosis not present

## 2018-04-01 DIAGNOSIS — K648 Other hemorrhoids: Secondary | ICD-10-CM | POA: Diagnosis not present

## 2018-04-01 DIAGNOSIS — I4891 Unspecified atrial fibrillation: Secondary | ICD-10-CM | POA: Insufficient documentation

## 2018-04-01 DIAGNOSIS — D128 Benign neoplasm of rectum: Secondary | ICD-10-CM | POA: Diagnosis not present

## 2018-04-01 DIAGNOSIS — I5032 Chronic diastolic (congestive) heart failure: Secondary | ICD-10-CM | POA: Diagnosis not present

## 2018-04-01 DIAGNOSIS — Q438 Other specified congenital malformations of intestine: Secondary | ICD-10-CM | POA: Insufficient documentation

## 2018-04-01 DIAGNOSIS — R194 Change in bowel habit: Secondary | ICD-10-CM | POA: Diagnosis not present

## 2018-04-01 DIAGNOSIS — K635 Polyp of colon: Secondary | ICD-10-CM | POA: Diagnosis not present

## 2018-04-01 DIAGNOSIS — Z87891 Personal history of nicotine dependence: Secondary | ICD-10-CM | POA: Insufficient documentation

## 2018-04-01 DIAGNOSIS — Z885 Allergy status to narcotic agent status: Secondary | ICD-10-CM | POA: Insufficient documentation

## 2018-04-01 DIAGNOSIS — K3189 Other diseases of stomach and duodenum: Secondary | ICD-10-CM | POA: Diagnosis not present

## 2018-04-01 DIAGNOSIS — K269 Duodenal ulcer, unspecified as acute or chronic, without hemorrhage or perforation: Secondary | ICD-10-CM | POA: Diagnosis not present

## 2018-04-01 DIAGNOSIS — Z9981 Dependence on supplemental oxygen: Secondary | ICD-10-CM | POA: Insufficient documentation

## 2018-04-01 DIAGNOSIS — Z8249 Family history of ischemic heart disease and other diseases of the circulatory system: Secondary | ICD-10-CM | POA: Insufficient documentation

## 2018-04-01 DIAGNOSIS — Z888 Allergy status to other drugs, medicaments and biological substances status: Secondary | ICD-10-CM | POA: Insufficient documentation

## 2018-04-01 HISTORY — DX: Gastro-esophageal reflux disease without esophagitis: K21.9

## 2018-04-01 HISTORY — PX: ESOPHAGOGASTRODUODENOSCOPY (EGD) WITH PROPOFOL: SHX5813

## 2018-04-01 HISTORY — PX: COLONOSCOPY WITH PROPOFOL: SHX5780

## 2018-04-01 HISTORY — DX: Benign neoplasm of colon, unspecified: D12.6

## 2018-04-01 HISTORY — DX: Unspecified osteoarthritis, unspecified site: M19.90

## 2018-04-01 HISTORY — DX: Acute pancreatitis without necrosis or infection, unspecified: K85.90

## 2018-04-01 HISTORY — DX: Angina pectoris, unspecified: I20.9

## 2018-04-01 HISTORY — DX: Diverticulosis of intestine, part unspecified, without perforation or abscess without bleeding: K57.90

## 2018-04-01 LAB — GLUCOSE, CAPILLARY: Glucose-Capillary: 127 mg/dL — ABNORMAL HIGH (ref 65–99)

## 2018-04-01 SURGERY — ESOPHAGOGASTRODUODENOSCOPY (EGD) WITH PROPOFOL
Anesthesia: General

## 2018-04-01 MED ORDER — SODIUM CHLORIDE 0.9 % IV SOLN
INTRAVENOUS | Status: DC
  Administered 2018-04-01: 1000 mL via INTRAVENOUS

## 2018-04-01 MED ORDER — DEXMEDETOMIDINE HCL 200 MCG/2ML IV SOLN
INTRAVENOUS | Status: DC | PRN
  Administered 2018-04-01: 12 ug via INTRAVENOUS

## 2018-04-01 MED ORDER — PROPOFOL 500 MG/50ML IV EMUL
INTRAVENOUS | Status: AC
  Filled 2018-04-01: qty 50

## 2018-04-01 MED ORDER — GLYCOPYRROLATE 0.2 MG/ML IJ SOLN
INTRAMUSCULAR | Status: DC | PRN
  Administered 2018-04-01: 0.2 mg via INTRAVENOUS

## 2018-04-01 MED ORDER — PROPOFOL 500 MG/50ML IV EMUL
INTRAVENOUS | Status: DC | PRN
  Administered 2018-04-01: 75 ug/kg/min via INTRAVENOUS

## 2018-04-01 MED ORDER — PROPOFOL 10 MG/ML IV BOLUS
INTRAVENOUS | Status: DC | PRN
  Administered 2018-04-01 (×3): 20 mg via INTRAVENOUS

## 2018-04-01 MED ORDER — SODIUM CHLORIDE 0.9 % IV SOLN
INTRAVENOUS | Status: DC
  Administered 2018-04-01: 09:00:00 via INTRAVENOUS

## 2018-04-01 NOTE — Anesthesia Preprocedure Evaluation (Signed)
Anesthesia Evaluation  Patient identified by MRN, date of birth, ID band Patient awake    Reviewed: Allergy & Precautions, H&P , NPO status , Patient's Chart, lab work & pertinent test results, reviewed documented beta blocker date and time   History of Anesthesia Complications Negative for: history of anesthetic complications  Airway Mallampati: I  TM Distance: >3 FB Neck ROM: full    Dental  (+) Edentulous Upper, Edentulous Lower   Pulmonary shortness of breath, with exertion and Long-Term Oxygen Therapy, asthma , sleep apnea , COPD,  COPD inhaler and oxygen dependent, neg recent URI, former smoker,           Cardiovascular Exercise Tolerance: Good hypertension, (-) angina+ CAD, + Past MI, + Cardiac Stents, + CABG and +CHF  + dysrhythmias Atrial Fibrillation and Supra Ventricular Tachycardia (-) Valvular Problems/Murmurs     Neuro/Psych PSYCHIATRIC DISORDERS Anxiety negative neurological ROS     GI/Hepatic Neg liver ROS, GERD  ,  Endo/Other  diabetes  Renal/GU CRFRenal disease  negative genitourinary   Musculoskeletal   Abdominal   Peds  Hematology negative hematology ROS (+)   Anesthesia Other Findings Past Medical History: No date: A-fib (HCC) No date: Anemia No date: Anginal pain ([place]) No date: Anxiety No date: Arthritis No date: Asthma No date: CAD (coronary artery disease)     Comment:  a. 2002 CABGx2 (LIMA->LAD, VG->VG->OM1);  b. 09/2012               DES->OM;  c. 03/2015 PTCA of LAD [facility]) in setting of               atretic LIMA; d. 05/2015 Cath [facility]): nonobs dzs; e. 06/2015              Cath ([facility]): LM nl, LAD 45p/d ISR, 50d, D1/2 small, LCX               50p/d ISR, OM1 70ost, 30 ISR, VG->OM1 50ost, 62m               LIMA->LAD 99p/d - atretic, RCA dom, nl; f.cath 10/16:               40-50%(FFR 0.90) pLAD, 75% (FFR 0.77) mLAD s/p PCI/DES,               oRCA 40% (FFR0.95) No date: Celiac disease No  date: Chronic diastolic CHF (congestive heart failure) ([place]     Comment:  a. 06/2009 Echo: EF 60-65%, Gr 1 DD, triv AI, mildly dil               LA, nl RV. No date: COPD (chronic obstructive pulmonary disease) (H[facility])     Comment:  a. Chronic bronchitis and emphysema. No date: Diverticulosis No date: Dysrhythmia No date: Essential hypertension No date: GERD (gastroesophageal reflux disease) No date: History of tobacco abuse     Comment:  a. Quit 2014. No date: Pancreatitis No date: PSVT (paroxysmal supraventricular tachycardia) ([place]     Comment:  a. 10/2012 Noted on Zio Patch. No date: Tubular adenoma of colon No date: Type II diabetes mellitus (HCC)   Reproductive/Obstetrics negative OB ROS                             Anesthesia Physical Anesthesia Plan  ASA: III  Anesthesia Plan: General   Post-op Pain Management:    Induction: Intravenous  PONV Risk Score and Plan: 2 and Propofol infusion  Airway Management Planned: Natural Airway and Nasal Cannula  Additional Equipment:   Intra-op Plan:   Post-operative Plan:   Informed Consent: I have reviewed the patients History and Physical, chart, labs and discussed the procedure including the risks, benefits and alternatives for the proposed anesthesia with the patient or authorized representative who has indicated his/her understanding and acceptance.   Dental Advisory Given  Plan Discussed with: Anesthesiologist, CRNA and Surgeon  Anesthesia Plan Comments:         Anesthesia Quick Evaluation

## 2018-04-01 NOTE — Op Note (Signed)
[facility] Gastroenterology Patient Name: Lawrence [name] Procedure Date: 04/01/2018 8:57 AM MRN: [id number] Account #: [id number] Date of Birth: [birth year] Admit Type: Outpatient Age: 62 Room: [facility] ENDO ROOM 3 Gender: Male Note Status: Finalized Procedure:            Colonoscopy Indications:          Change in bowel habits Providers:            Manya Silvas, MD Referring MD:         [name]. [name], MD (Referring MD) Medicines:            Propofol per Anesthesia Complications:        No immediate complications. Procedure:            Pre-Anesthesia Assessment:                       - After reviewing the risks and benefits, the patient                        was deemed in satisfactory condition to undergo the                        procedure.                       After obtaining informed consent, the colonoscope was                        passed under direct vision. Throughout the procedure,                        the patient's blood pressure, pulse, and oxygen                        saturations were monitored continuously. The                        Colonoscope was introduced through the anus and                        advanced to the the cecum, identified by appendiceal                        orifice and ileocecal valve. The colonoscopy was                        somewhat difficult due to a redundant colon. Successful                        completion of the procedure was aided by applying                        abdominal pressure. The patient tolerated the procedure                        well. The quality of the bowel preparation was good. Findings:      A diminutive polyp was found in the transverse colon. The polyp was       sessile. The polyp was removed with a jumbo cold forceps. Resection and       retrieval were complete.  A small polyp was found in the transverse colon. The polyp was sessile.       The polyp was removed with a hot snare.  Resection and retrieval were       complete.      A small polyp was found in the descending colon. The polyp was sessile.       The polyp was removed with a hot snare. Resection and retrieval were       complete. To prevent bleeding after the polypectomy, one hemostatic clip       was successfully placed. There was no bleeding at the end of the       procedure.      Two sessile polyps were found in the rectum. The polyps were diminutive       in size. These polyps were removed with a jumbo cold forceps. Resection       and retrieval were complete.      The exam was otherwise without abnormality. Impression:           - One diminutive polyp in the transverse colon, removed                        with a jumbo cold forceps. Resected and retrieved.                       - One small polyp in the transverse colon, removed with                        a hot snare. Resected and retrieved.                       - One small polyp in the descending colon, removed with                        a hot snare. Resected and retrieved. Clip was placed.                       - Two diminutive polyps in the rectum, removed with a                        jumbo cold forceps. Resected and retrieved.                       - The examination was otherwise normal. Recommendation:       - Await pathology results. Manya Silvas, MD 04/01/2018 9:59:57 AM This report has been signed electronically. Number of Addenda: 0 Note Initiated On: 04/01/2018 8:57 AM Scope Withdrawal Time: 0 hours 21 minutes 12 seconds  Total Procedure Duration: 0 hours 35 minutes 12 seconds       [facility]

## 2018-04-01 NOTE — Transfer of Care (Signed)
Immediate Anesthesia Transfer of Care Note  Patient: Lawrence [name]  Procedure(s) Performed: ESOPHAGOGASTRODUODENOSCOPY (EGD) WITH PROPOFOL (N/A ) COLONOSCOPY WITH PROPOFOL (N/A )  Patient Location: PACU and Endoscopy Unit  Anesthesia Type:General  Level of Consciousness: awake, alert  and oriented  Airway & Oxygen Therapy: Patient Spontanous Breathing and Patient connected to nasal cannula oxygen  Post-op Assessment: Report given to RN and Post -op Vital signs reviewed and stable  Post vital signs: Reviewed and stable  Last Vitals:  Vitals Value Taken Time  BP 126/70 04/01/2018 10:00 AM  Temp 36.4 C 04/01/2018  9:59 AM  Pulse 71 04/01/2018 10:00 AM  Resp 15 04/01/2018 10:00 AM  SpO2 98 % 04/01/2018 10:00 AM  Vitals shown include unvalidated device data.  Last Pain:  Vitals:   04/01/18 0959  TempSrc: Tympanic  PainSc: 0-No pain         Complications: No apparent anesthesia complications

## 2018-04-01 NOTE — Op Note (Signed)
[facility] Gastroenterology Patient Name: Lawrence [name] Procedure Date: 04/01/2018 8:58 AM MRN: [id number] Account #: [id number] Date of Birth: 1956/09/17 Admit Type: Outpatient Age: 62 Room: [facility] ENDO ROOM 3 Gender: Male Note Status: Finalized Procedure:            Upper GI endoscopy Indications:          Epigastric abdominal pain, Abdominal pain in the left                        upper quadrant Providers:            Manya Silvas, MD Referring MD:         [name]. [name], MD (Referring MD) Medicines:            Propofol per Anesthesia Complications:        No immediate complications. Procedure:            Pre-Anesthesia Assessment:                       - After reviewing the risks and benefits, the patient                        was deemed in satisfactory condition to undergo the                        procedure.                       After obtaining informed consent, the endoscope was                        passed under direct vision. Throughout the procedure,                        the patient's blood pressure, pulse, and oxygen                        saturations were monitored continuously. The Endoscope                        was introduced through the mouth, and advanced to the                        second part of duodenum. The upper GI endoscopy was                        accomplished without difficulty. The patient tolerated                        the procedure well. Findings:      A previous surgical anastomosis was found at the gastroesophageal       junction.      Diffuse and patchy moderate inflammation characterized by erythema and       granularity was found in the gastric body and in the gastric antrum.       Biopsies were taken with a cold forceps for histology. Biopsies were       taken with a cold forceps for Helicobacter pylori testing.      Multiple diffuse erosions/ superfcial ulcers, were seen without bleeding       were found in  the duodenal bulb and in the second portion of the       duodenum. Impression:           - A previous surgical anastomosis was found.                       - Gastritis. Biopsied.                       - Duodenal erosions without bleeding. Recommendation:       - Await pathology results. Manya Silvas, MD 04/01/2018 9:16:58 AM This report has been signed electronically. Number of Addenda: 0 Note Initiated On: 04/01/2018 8:58 AM      [facility]

## 2018-04-01 NOTE — Anesthesia Post-op Follow-up Note (Signed)
Anesthesia QCDR form completed.        

## 2018-04-01 NOTE — H&P (Signed)
Primary Care Physician:  Birdie Sons, MD Primary Gastroenterologist:  Dr. Vira Agar  Pre-Procedure History & Physical: HPI:  Lawrence [name] is a 62 y.o. male is here for an endoscopy and colonoscopy. Done for [facility] colon polyps and LUQ abd pain.   Past Medical History:  Diagnosis Date  . A-fib ([place])   . Anemia   . Anginal pain ([place])   . Anxiety   . Arthritis   . Asthma   . CAD (coronary artery disease)    a. 2002 CABGx2 (LIMA->LAD, VG->VG->OM1);  b. 09/2012 DES->OM;  c. 03/2015 PTCA of LAD [facility]) in setting of atretic LIMA; d. 05/2015 Cath [facility]): nonobs dzs; e. 06/2015 Cath ([facility]): LM nl, LAD 45p/d ISR, 50d, D1/2 small, LCX 50p/d ISR, OM1 70ost, 30 ISR, VG->OM1 50ost, 47m LIMA->LAD 99p/d - atretic, RCA dom, nl; f.cath 10/16: 40-50%(FFR 0.90) pLAD, 75% (FFR 0.77) mLAD s/p PCI/DES, oRCA 40% (FFR0.95)  . Celiac disease   . Chronic diastolic CHF (congestive heart failure) ([place]    a. 06/2009 Echo: EF 60-65%, Gr 1 DD, triv AI, mildly dil LA, nl RV.  .Marland KitchenCOPD (chronic obstructive pulmonary disease) ([place]    a. Chronic bronchitis and emphysema.  . Diverticulosis   . Dysrhythmia   . Essential hypertension   . GERD (gastroesophageal reflux disease)   . History of tobacco abuse    a. Quit 2014.  .Marland KitchenPancreatitis   . PSVT (paroxysmal supraventricular tachycardia) ([place]    a. 10/2012 Noted on Zio Patch.  . Tubular adenoma of colon   . Type II diabetes mellitus ([place]     Past Surgical History:  Procedure Laterality Date  . BYPASS GRAFT    . CARDIAC CATHETERIZATION N/A 07/12/2015   rocedure: Left Heart Cath and Cors/Grafts Angiography;  Surgeon: HBelva Crome MD;  [place] [facility];  Service: Cardiovascular;  Laterality: N/A;  . CARDIAC CATHETERIZATION Right 10/07/2015   Procedure: Left Heart Cath and Cors/Grafts Angiography;  Surgeon: SDionisio David MD;  [place] [facility];  Service: Cardiovascular;  Laterality: Right;  . CARDIAC CATHETERIZATION N/A 04/06/2016   Procedure: Left Heart Cath and Coronary Angiography;  Surgeon: DYolonda Kida MD;  [place];  Service: Cardiovascular;  Laterality: N/A;  . CARDIAC CATHETERIZATION  04/06/2016   Procedure: Bypass Graft Angiography;  Surgeon: DYolonda Kida MD;  [place];  Service: Cardiovascular;;  . CARDIAC CATHETERIZATION N/A 11/02/2016   Procedure: Left Heart Cath and Cors/Grafts Angiography and possible PCI;  Surgeon: DYolonda Kida MD;  [facility]: [facility];  Service: Cardiovascular;  Laterality: N/A;  . CARDIAC CATHETERIZATION N/A 11/02/2016   Procedure: Coronary Stent Intervention;  Surgeon: DYolonda Kida MD;  [place];  Service: Cardiovascular;  Laterality: N/A;  . CHOLECYSTECTOMY    . ESOPHAGEAL DILATION    . TONSILLECTOMY    . VASCULAR SURGERY      Prior to Admission medications   Medication Sig Start Date End Date Taking? Authorizing Provider  dexlansoprazole (DEXILANT) 60 MG capsule Take 60 mg by mouth daily.   Yes [provider]  EMPAGLIFLOZIN PO Take 10 mg by mouth daily.   Yes [provider]  isosorbide mononitrate (IMDUR) 60 MG 24 hr tablet Take 1 tablet (60 mg total) by mouth daily. Patient taking differently: Take 60 mg by mouth 2 (two) times daily.  11/04/16  Yes SHenreitta Leber MD  lisinopril (PRINIVIL,ZESTRIL) 2.5 MG tablet TAKE  1 TABLET BY MOUTH EVERY DAY 03/11/18  Yes Birdie Sons, MD  sotalol (BETAPACE) 120 MG tablet Take 120 mg by mouth 2 (two) times daily.    Yes [provider]  albuterol (PROVENTIL HFA;VENTOLIN HFA) 108 (90 Base) MCG/ACT inhaler Inhale 2 puffs into the lungs every 4 (four) hours as needed for wheezing or shortness of breath. Reported on 04/08/2016 10/21/17   Birdie Sons, MD  albuterol (PROVENTIL) (2.5 MG/3ML) 0.083% nebulizer solution Take 3 mLs (2.5 mg total) by nebulization every 6 (six) hours as needed for wheezing or shortness of  breath. 01/04/18   Birdie Sons, MD  allopurinol (ZYLOPRIM) 100 MG tablet TAKE 3 TABLETS BY MOUTH EVERY DAY. 03/11/18   Birdie Sons, MD  ALPRAZolam Duanne Moron) 1 MG tablet TAKE ONE-HALF TO ONE TABLET BY MOUTH THREE TIMES A DAY AS NEEDED 03/24/18   Birdie Sons, MD  aspirin 81 MG chewable tablet Chew 81 mg by mouth daily.    [provider]  atorvastatin (LIPITOR) 80 MG tablet TAKE ONE TABLET BY MOUTH EVERY NIGHT AT BEDTIME 08/13/17   Birdie Sons, MD  Blood Glucose Monitoring Suppl (ACCU-CHEK AVIVA PLUS) w/Device KIT CHECK BLOOD SUGAR EVERY DAY 12/08/17   Birdie Sons, MD  cephALEXin (KEFLEX) 500 MG capsule Take 1 capsule (500 mg total) by mouth 4 (four) times daily for 10 days. 03/25/18 04/04/18  Sable Feil, PA-C  cetirizine (ZYRTEC) 10 MG tablet TAKE ONE TABLET BY MOUTH AT BEDTIME. Patient taking differently: 1-2 tid prn 05/12/17   Birdie Sons, MD  cyclobenzaprine (FLEXERIL) 5 MG tablet TAKE 1 TO 2 TABLETS BY MOUTH THREE TIMESDAILY AS NEEDED (BACK PAIN) 11/15/17   Birdie Sons, MD  docusate sodium (COLACE) 100 MG capsule Take 100 mg by mouth daily.     [provider]  ELIQUIS 5 MG TABS tablet TAKE 1 TABLET BY MOUTH TWICE A DAY. 07/28/17   Birdie Sons, MD  fluticasone furoate-vilanterol (BREO ELLIPTA) 100-25 MCG/INH AEPB Inhale 1 puff into the lungs daily. 01/18/18   Allyne Gee, MD  gentamicin (GARAMYCIN) 0.3 % ophthalmic solution Place 1 drop into the left eye every 4 (four) hours. 03/25/18   Sable Feil, PA-C  glucose blood (ACCU-CHEK AVIVA PLUS) test strip Use to check blood sugar once a day 02/14/18   Birdie Sons, MD  hydrocortisone 1 % ointment Apply 1 application topically 2 (two) times daily. Apply to rash 06/19/17   [name], MD  hydrOXYzine (ATARAX/VISTARIL) 50 MG tablet Take 1 tablet (50 mg total) by mouth 3 (three) times daily as needed. 03/25/18   Sable Feil, PA-C  lansoprazole (PREVACID) 30 MG capsule Take 30 mg by mouth  2 (two) times daily.     [provider]  Magnesium Oxide 400 (240 Mg) MG TABS TAKE ONE TABLET BY MOUTH DAILY 03/23/17   Birdie Sons, MD  metFORMIN (GLUCOPHAGE) 500 MG tablet TAKE 1 TABLET BY MOUTH EVERY MORNING 11/30/17   Birdie Sons, MD  nitroGLYCERIN (NITROSTAT) 0.4 MG SL tablet Place 1 tablet (0.4 mg total) under the tongue every 5 (five) minutes as needed for chest pain. Reported on 05/26/2016 10/06/16   Birdie Sons, MD  omega-3 acid ethyl esters (LOVAZA) 1 g capsule TAKE FOUR CAPSULES BY MOUTH DAILY 06/04/17   Birdie Sons, MD  ondansetron (ZOFRAN) 4 MG tablet Take 1 tablet (4 mg total) by mouth every 8 (eight) hours as needed for  nausea or vomiting. 11/08/17   Schuyler Amor, MD  oxyCODONE-acetaminophen (PERCOCET) 10-325 MG tablet Take 1 tablet by mouth every 4 (four) hours as needed. 03/22/18   Birdie Sons, MD  potassium chloride SA (K-DUR,KLOR-CON) 20 MEQ tablet  08/13/17   [provider]  promethazine (PHENERGAN) 25 MG tablet Take 1 tablet (25 mg total) by mouth every 8 (eight) hours as needed for nausea or vomiting. 01/24/18   Birdie Sons, MD  RANEXA 500 MG 12 hr tablet Take 1,000 mg by mouth 2 (two) times daily.    [provider]  SPIRIVA HANDIHALER 18 MCG inhalation capsule INHALE ONE CAPSULE AS DIRECTED ONCE A DAY 02/03/18   Birdie Sons, MD  sucralfate (CARAFATE) 1 g tablet Take 1 tablet (1 g total) by mouth 2 (two) times daily. 01/04/18   Birdie Sons, MD  tamsulosin (FLOMAX) 0.4 MG CAPS capsule Take by mouth.    [provider]  torsemide (DEMADEX) 100 MG tablet Take 0.5 tablets by mouth daily. 10/11/17   [provider]  traMADol (ULTRAM) 50 MG tablet Take 1 tablet (50 mg total) by mouth every 6 (six) hours as needed. 12/12/17 12/12/18  Lavonia Drafts, MD    Allergies as of 03/09/2018 - Review Complete 01/04/2018  Allergen Reaction Noted  . Demerol [meperidine] Hives 03/22/2015  . Prednisone Other (See  Comments) and Hypertension 10/20/2015  . Sulfa antibiotics Hives 03/22/2015  . Albuterol sulfate [albuterol] Palpitations and Other (See Comments) 07/03/2015  . Morphine sulfate Nausea And Vomiting, Rash, and Other (See Comments) 07/03/2015    Family History  Problem Relation Age of Onset  . Heart attack Mother   . Depression Mother   . Heart disease Mother   . COPD Mother   . Hypertension Mother   . Heart attack Father   . Diabetes Father   . Depression Father   . Heart disease Father   . Cirrhosis Father   . Parkinson's disease Brother     Social History   Socioeconomic History  . Marital status: Married    Spouse name: Not on file  . Number of children: 1  . Years of education: Not on file  . Highest education level: 10th grade  Occupational History  . Occupation: Disabled  Social Needs  . Financial resource strain: Not hard at all  . Food insecurity:    Worry: Never true    Inability: Never true  . Transportation needs:    Medical: No    Non-medical: No  Tobacco Use  . Smoking status: Former Smoker    Last attempt to quit: 04/22/2013    Years since quitting: 4.9  . Smokeless tobacco: Never Used  Substance and Sexual Activity  . Alcohol use: No    Comment: remotely quit alcohol use. Hx of heavy alcohol use.  . Drug use: No  . Sexual activity: Not on file  Lifestyle  . Physical activity:    Days per week: Not on file    Minutes per session: Not on file  . Stress: Only a little  Relationships  . Social connections:    Talks on phone: Not on file    Gets together: Not on file    Attends religious service: Not on file    Active member of club or organization: Not on file    Attends meetings of clubs or organizations: Not on file    Relationship status: Not on file  . Intimate partner violence:  Fear of current or ex partner: Not on file    Emotionally abused: Not on file    Physically abused: Not on file    Forced sexual activity: Not on file  Other  Topics Concern  . Not on file  Social History Narrative   Pt lives in [address] with wife.  Does not routinely exercise.    Review of Systems: See HPI, otherwise negative ROS  Physical Exam: BP (!) 219/97   Pulse 74   Temp (!) 97.5 F (36.4 C) (Tympanic)   Resp 18   Ht 5' 7" (1.702 m)   Wt 111.6 kg (246 lb)   SpO2 100%   BMI 38.53 kg/m  General:   Alert,  pleasant and cooperative in NAD Head:  Normocephalic and atraumatic. Neck:  Supple; no masses or thyromegaly. Lungs:  Clear throughout to auscultation.    Heart:  Regular rate and rhythm. Abdomen:  Soft, nontender and nondistended. Normal bowel sounds, without guarding, and without rebound.   Neurologic:  Alert and  oriented x4;  grossly normal neurologically.  Impression/Plan: Lawrence [name] is here for an endoscopy and colonoscopy to be performed for [facility] colon polyps and Left upper abd pain and chronic nausea.  Risks, benefits, limitations, and alternatives regarding  endoscopy and colonoscopy have been reviewed with the patient.  Questions have been answered.  All parties agreeable.   Gaylyn Cheers, MD  04/01/2018, 8:50 AM

## 2018-04-02 NOTE — Anesthesia Postprocedure Evaluation (Signed)
Anesthesia Post Note  Patient: TANDY LEWIN  Procedure(s) Performed: ESOPHAGOGASTRODUODENOSCOPY (EGD) WITH PROPOFOL (N/A ) COLONOSCOPY WITH PROPOFOL (N/A )  Patient location during evaluation: Endoscopy Anesthesia Type: General Level of consciousness: awake and alert Pain management: pain level controlled Vital Signs Assessment: post-procedure vital signs reviewed and stable Respiratory status: spontaneous breathing, nonlabored ventilation, respiratory function stable and patient connected to nasal cannula oxygen Cardiovascular status: blood pressure returned to baseline and stable Postop Assessment: no apparent nausea or vomiting Anesthetic complications: no     Last Vitals:  Vitals:   04/01/18 1009 04/01/18 1019  BP: 139/75 137/89  Pulse: 67 64  Resp: 13 15  Temp:    SpO2: 98% 98%    Last Pain:  Vitals:   04/02/18 1118  TempSrc:   PainSc: 0-No pain                 Martha Clan

## 2018-04-04 ENCOUNTER — Encounter: Payer: Self-pay | Admitting: Unknown Physician Specialty

## 2018-04-05 ENCOUNTER — Encounter: Payer: Self-pay | Admitting: Family Medicine

## 2018-04-05 DIAGNOSIS — Z86010 Personal history of colonic polyps: Secondary | ICD-10-CM | POA: Insufficient documentation

## 2018-04-05 LAB — SURGICAL PATHOLOGY

## 2018-04-11 DIAGNOSIS — I10 Essential (primary) hypertension: Secondary | ICD-10-CM | POA: Diagnosis not present

## 2018-04-11 DIAGNOSIS — J449 Chronic obstructive pulmonary disease, unspecified: Secondary | ICD-10-CM | POA: Diagnosis not present

## 2018-04-11 DIAGNOSIS — G4710 Hypersomnia, unspecified: Secondary | ICD-10-CM | POA: Diagnosis not present

## 2018-04-11 DIAGNOSIS — J439 Emphysema, unspecified: Secondary | ICD-10-CM | POA: Diagnosis not present

## 2018-04-13 ENCOUNTER — Ambulatory Visit (INDEPENDENT_AMBULATORY_CARE_PROVIDER_SITE_OTHER): Payer: Medicare HMO | Admitting: Internal Medicine

## 2018-04-13 DIAGNOSIS — R0602 Shortness of breath: Secondary | ICD-10-CM

## 2018-04-13 LAB — PULMONARY FUNCTION TEST

## 2018-04-14 NOTE — Procedures (Signed)
[place], [id number]  DATE OF SERVICE: Apr 13, 2018  Complete Pulmonary Function Testing Interpretation:  FINDINGS:  Forced vital capacity is normal.  The FEV1 is 2.03 L which is 70% of predicted and is mildly decreased.  Postbronchodilator there is no significant improvement in the FEV1 however clinical improvement may occur in the absence of spirometric improvement and does not preclude the use of bronchodilators.  FEV1 FVC ratio is mildly decreased.  Total lung capacity is increased residual volume is increased residual volume total lung capacity ratio is increased FRC is increased DLCO is within normal limits  IMPRESSION:  This study is suggestive of mild obstructive lung disease with hyperinflation. DLCO is within normal limits Clinical correlation recommended  Allyne Gee, MD [facility] Pulmonary Critical Care Medicine Sleep Medicine

## 2018-04-18 ENCOUNTER — Observation Stay: Admit: 2018-04-18 | Payer: Medicare HMO

## 2018-04-18 ENCOUNTER — Observation Stay
Admission: EM | Admit: 2018-04-18 | Discharge: 2018-04-19 | Disposition: A | Discharge status: Home / Self Care | Payer: Medicare HMO | Attending: Internal Medicine | Admitting: Internal Medicine

## 2018-04-18 ENCOUNTER — Ambulatory Visit: Payer: Medicare HMO | Admitting: Family

## 2018-04-18 ENCOUNTER — Encounter: Payer: Self-pay | Admitting: Family

## 2018-04-18 ENCOUNTER — Other Ambulatory Visit: Payer: Self-pay

## 2018-04-18 ENCOUNTER — Emergency Department: Payer: Medicare HMO

## 2018-04-18 ENCOUNTER — Encounter: Payer: Self-pay | Admitting: Emergency Medicine

## 2018-04-18 VITALS — BP 181/85 | HR 59 | Resp 18 | Ht 67.0 in | Wt 250.4 lb

## 2018-04-18 DIAGNOSIS — J449 Chronic obstructive pulmonary disease, unspecified: Secondary | ICD-10-CM | POA: Insufficient documentation

## 2018-04-18 DIAGNOSIS — Z7902 Long term (current) use of antithrombotics/antiplatelets: Secondary | ICD-10-CM | POA: Insufficient documentation

## 2018-04-18 DIAGNOSIS — Z885 Allergy status to narcotic agent status: Secondary | ICD-10-CM | POA: Diagnosis not present

## 2018-04-18 DIAGNOSIS — K900 Celiac disease: Secondary | ICD-10-CM | POA: Insufficient documentation

## 2018-04-18 DIAGNOSIS — Z79899 Other long term (current) drug therapy: Secondary | ICD-10-CM | POA: Insufficient documentation

## 2018-04-18 DIAGNOSIS — Z888 Allergy status to other drugs, medicaments and biological substances status: Secondary | ICD-10-CM | POA: Insufficient documentation

## 2018-04-18 DIAGNOSIS — I5033 Acute on chronic diastolic (congestive) heart failure: Secondary | ICD-10-CM

## 2018-04-18 DIAGNOSIS — I471 Supraventricular tachycardia: Secondary | ICD-10-CM | POA: Diagnosis not present

## 2018-04-18 DIAGNOSIS — R0789 Other chest pain: Secondary | ICD-10-CM

## 2018-04-18 DIAGNOSIS — Z87891 Personal history of nicotine dependence: Secondary | ICD-10-CM | POA: Diagnosis not present

## 2018-04-18 DIAGNOSIS — I25119 Atherosclerotic heart disease of native coronary artery with unspecified angina pectoris: Secondary | ICD-10-CM | POA: Diagnosis not present

## 2018-04-18 DIAGNOSIS — I482 Chronic atrial fibrillation: Secondary | ICD-10-CM | POA: Diagnosis not present

## 2018-04-18 DIAGNOSIS — Z882 Allergy status to sulfonamides status: Secondary | ICD-10-CM | POA: Insufficient documentation

## 2018-04-18 DIAGNOSIS — R0602 Shortness of breath: Secondary | ICD-10-CM | POA: Diagnosis not present

## 2018-04-18 DIAGNOSIS — I2510 Atherosclerotic heart disease of native coronary artery without angina pectoris: Secondary | ICD-10-CM | POA: Diagnosis not present

## 2018-04-18 DIAGNOSIS — F419 Anxiety disorder, unspecified: Secondary | ICD-10-CM | POA: Insufficient documentation

## 2018-04-18 DIAGNOSIS — R079 Chest pain, unspecified: Secondary | ICD-10-CM | POA: Diagnosis not present

## 2018-04-18 DIAGNOSIS — I110 Hypertensive heart disease with heart failure: Secondary | ICD-10-CM | POA: Insufficient documentation

## 2018-04-18 DIAGNOSIS — K219 Gastro-esophageal reflux disease without esophagitis: Secondary | ICD-10-CM | POA: Insufficient documentation

## 2018-04-18 DIAGNOSIS — Z9981 Dependence on supplemental oxygen: Secondary | ICD-10-CM | POA: Diagnosis not present

## 2018-04-18 DIAGNOSIS — Z955 Presence of coronary angioplasty implant and graft: Secondary | ICD-10-CM | POA: Insufficient documentation

## 2018-04-18 DIAGNOSIS — E119 Type 2 diabetes mellitus without complications: Secondary | ICD-10-CM | POA: Insufficient documentation

## 2018-04-18 DIAGNOSIS — I5032 Chronic diastolic (congestive) heart failure: Secondary | ICD-10-CM | POA: Diagnosis not present

## 2018-04-18 DIAGNOSIS — G4733 Obstructive sleep apnea (adult) (pediatric): Secondary | ICD-10-CM | POA: Diagnosis not present

## 2018-04-18 DIAGNOSIS — I25798 Atherosclerosis of other coronary artery bypass graft(s) with other forms of angina pectoris: Secondary | ICD-10-CM | POA: Diagnosis not present

## 2018-04-18 DIAGNOSIS — Z7982 Long term (current) use of aspirin: Secondary | ICD-10-CM | POA: Diagnosis not present

## 2018-04-18 DIAGNOSIS — E1159 Type 2 diabetes mellitus with other circulatory complications: Secondary | ICD-10-CM

## 2018-04-18 DIAGNOSIS — J410 Simple chronic bronchitis: Secondary | ICD-10-CM

## 2018-04-18 DIAGNOSIS — I509 Heart failure, unspecified: Secondary | ICD-10-CM | POA: Insufficient documentation

## 2018-04-18 DIAGNOSIS — I10 Essential (primary) hypertension: Secondary | ICD-10-CM

## 2018-04-18 DIAGNOSIS — Z951 Presence of aortocoronary bypass graft: Secondary | ICD-10-CM | POA: Insufficient documentation

## 2018-04-18 DIAGNOSIS — Z7984 Long term (current) use of oral hypoglycemic drugs: Secondary | ICD-10-CM | POA: Insufficient documentation

## 2018-04-18 LAB — CBC
HCT: 51.4 % (ref 40.0–52.0)
Hemoglobin: 17.4 g/dL (ref 13.0–18.0)
MCH: 30.9 pg (ref 26.0–34.0)
MCHC: 33.8 g/dL (ref 32.0–36.0)
MCV: 91.4 fL (ref 80.0–100.0)
Platelets: 195 10*3/uL (ref 150–440)
RBC: 5.63 MIL/uL (ref 4.40–5.90)
RDW: 16.1 % — ABNORMAL HIGH (ref 11.5–14.5)
WBC: 5.4 10*3/uL (ref 3.8–10.6)

## 2018-04-18 LAB — COMPREHENSIVE METABOLIC PANEL
ALT: 25 U/L (ref 17–63)
AST: 21 U/L (ref 15–41)
Albumin: 4 g/dL (ref 3.5–5.0)
Alkaline Phosphatase: 74 U/L (ref 38–126)
Anion gap: 11 (ref 5–15)
BUN: 16 mg/dL (ref 6–20)
CO2: 29 mmol/L (ref 22–32)
Calcium: 9.4 mg/dL (ref 8.9–10.3)
Chloride: 99 mmol/L — ABNORMAL LOW (ref 101–111)
Creatinine, Ser: 1.26 mg/dL — ABNORMAL HIGH (ref 0.61–1.24)
GFR calc Af Amer: >60 mL/min (ref >60)
GFR calc non Af Amer: 59 mL/min — ABNORMAL LOW (ref >60)
Glucose, Bld: 177 mg/dL — ABNORMAL HIGH (ref 65–99)
Potassium: 4.8 mmol/L (ref 3.5–5.1)
Sodium: 139 mmol/L (ref 135–145)
Total Bilirubin: 1.2 mg/dL (ref 0.3–1.2)
Total Protein: 7.2 g/dL (ref 6.5–8.1)

## 2018-04-18 LAB — TROPONIN I
Troponin I: <0.03 ng/mL (ref <0.03)
Troponin I: <0.03 ng/mL (ref <0.03)
Troponin I: <0.03 ng/mL (ref <0.03)

## 2018-04-18 LAB — GLUCOSE, CAPILLARY: Glucose-Capillary: 131 mg/dL — ABNORMAL HIGH (ref 65–99)

## 2018-04-18 MED ORDER — FLUTICASONE FUROATE-VILANTEROL 100-25 MCG/INH IN AEPB
1.0000 | INHALATION_SPRAY | Freq: Every day | RESPIRATORY_TRACT | Status: DC
  Administered 2018-04-19: 1 via RESPIRATORY_TRACT
  Filled 2018-04-18: qty 28

## 2018-04-18 MED ORDER — DOCUSATE SODIUM 100 MG PO CAPS
100.0000 mg | ORAL_CAPSULE | Freq: Every day | ORAL | Status: DC
  Administered 2018-04-19: 100 mg via ORAL
  Filled 2018-04-18: qty 1

## 2018-04-18 MED ORDER — ONDANSETRON HCL 4 MG/2ML IJ SOLN
4.0000 mg | Freq: Once | INTRAMUSCULAR | Status: AC
  Administered 2018-04-18: 4 mg via INTRAVENOUS
  Filled 2018-04-18: qty 2

## 2018-04-18 MED ORDER — OXYCODONE-ACETAMINOPHEN 10-325 MG PO TABS: 1.0000 | ORAL_TABLET | ORAL | Status: DC | PRN

## 2018-04-18 MED ORDER — ALBUTEROL SULFATE (2.5 MG/3ML) 0.083% IN NEBU: 2.5000 mg | INHALATION_SOLUTION | Freq: Four times a day (QID) | RESPIRATORY_TRACT | Status: DC | PRN

## 2018-04-18 MED ORDER — SOTALOL HCL 80 MG PO TABS
120.0000 mg | ORAL_TABLET | Freq: Two times a day (BID) | ORAL | Status: DC
  Administered 2018-04-18 – 2018-04-19 (×2): 120 mg via ORAL
  Filled 2018-04-18 (×3): qty 1.5

## 2018-04-18 MED ORDER — ALBUTEROL SULFATE HFA 108 (90 BASE) MCG/ACT IN AERS: 2.0000 | INHALATION_SPRAY | RESPIRATORY_TRACT | Status: DC | PRN

## 2018-04-18 MED ORDER — ONDANSETRON HCL 4 MG/2ML IJ SOLN: 4.0000 mg | Freq: Four times a day (QID) | INTRAMUSCULAR | Status: DC | PRN

## 2018-04-18 MED ORDER — ALLOPURINOL 300 MG PO TABS
300.0000 mg | ORAL_TABLET | Freq: Every day | ORAL | Status: DC
  Administered 2018-04-19: 300 mg via ORAL
  Filled 2018-04-18: qty 1

## 2018-04-18 MED ORDER — PANTOPRAZOLE SODIUM 40 MG PO TBEC
40.0000 mg | DELAYED_RELEASE_TABLET | Freq: Every day | ORAL | Status: DC
  Administered 2018-04-19: 40 mg via ORAL
  Filled 2018-04-18: qty 1

## 2018-04-18 MED ORDER — OXYCODONE HCL 5 MG PO TABS
5.0000 mg | ORAL_TABLET | ORAL | Status: DC | PRN
  Administered 2018-04-18 – 2018-04-19 (×2): 5 mg via ORAL
  Filled 2018-04-18 (×2): qty 1

## 2018-04-18 MED ORDER — TAMSULOSIN HCL 0.4 MG PO CAPS
0.4000 mg | ORAL_CAPSULE | Freq: Every day | ORAL | Status: DC
  Administered 2018-04-18: 0.4 mg via ORAL
  Filled 2018-04-18: qty 1

## 2018-04-18 MED ORDER — TORSEMIDE 20 MG PO TABS
50.0000 mg | ORAL_TABLET | Freq: Every day | ORAL | Status: DC
  Administered 2018-04-19: 50 mg via ORAL
  Filled 2018-04-18: qty 3

## 2018-04-18 MED ORDER — OMEGA-3-ACID ETHYL ESTERS 1 G PO CAPS
1.0000 g | ORAL_CAPSULE | Freq: Every day | ORAL | Status: DC
  Administered 2018-04-19: 1 g via ORAL
  Filled 2018-04-18: qty 1

## 2018-04-18 MED ORDER — ASPIRIN 300 MG RE SUPP: 300.0000 mg | RECTAL | Status: AC

## 2018-04-18 MED ORDER — FENTANYL CITRATE (PF) 100 MCG/2ML IJ SOLN
50.0000 ug | Freq: Once | INTRAMUSCULAR | Status: AC
  Administered 2018-04-18: 50 ug via INTRAVENOUS

## 2018-04-18 MED ORDER — FENTANYL CITRATE (PF) 100 MCG/2ML IJ SOLN
25.0000 ug | Freq: Once | INTRAMUSCULAR | Status: AC
  Administered 2018-04-18: 25 ug via INTRAVENOUS
  Filled 2018-04-18: qty 2

## 2018-04-18 MED ORDER — CYCLOBENZAPRINE HCL 10 MG PO TABS: 5.0000 mg | ORAL_TABLET | Freq: Three times a day (TID) | ORAL | Status: DC | PRN

## 2018-04-18 MED ORDER — ASPIRIN 81 MG PO CHEW
324.0000 mg | CHEWABLE_TABLET | ORAL | Status: AC
  Administered 2018-04-18: 324 mg via ORAL
  Filled 2018-04-18: qty 4

## 2018-04-18 MED ORDER — MORPHINE SULFATE (PF) 2 MG/ML IV SOLN
2.0000 mg | INTRAVENOUS | Status: DC | PRN
  Administered 2018-04-18 – 2018-04-19 (×4): 2 mg via INTRAVENOUS
  Filled 2018-04-18 (×4): qty 1

## 2018-04-18 MED ORDER — ATORVASTATIN CALCIUM 20 MG PO TABS
80.0000 mg | ORAL_TABLET | Freq: Every day | ORAL | Status: DC
  Administered 2018-04-18: 80 mg via ORAL
  Filled 2018-04-18: qty 4

## 2018-04-18 MED ORDER — FENTANYL CITRATE (PF) 100 MCG/2ML IJ SOLN
INTRAMUSCULAR | Status: AC
  Administered 2018-04-18: 50 ug via INTRAVENOUS
  Filled 2018-04-18: qty 2

## 2018-04-18 MED ORDER — MAGNESIUM OXIDE 400 (241.3 MG) MG PO TABS
400.0000 mg | ORAL_TABLET | Freq: Every day | ORAL | Status: DC
  Administered 2018-04-19: 400 mg via ORAL
  Filled 2018-04-18: qty 1

## 2018-04-18 MED ORDER — RANOLAZINE ER 500 MG PO TB12
1000.0000 mg | ORAL_TABLET | Freq: Two times a day (BID) | ORAL | Status: DC
  Administered 2018-04-18 – 2018-04-19 (×2): 1000 mg via ORAL
  Filled 2018-04-18 (×3): qty 2

## 2018-04-18 MED ORDER — ISOSORBIDE MONONITRATE ER 60 MG PO TB24
60.0000 mg | ORAL_TABLET | Freq: Every day | ORAL | Status: DC
  Administered 2018-04-18 – 2018-04-19 (×2): 60 mg via ORAL
  Filled 2018-04-18 (×2): qty 1

## 2018-04-18 MED ORDER — ALPRAZOLAM 0.5 MG PO TABS: 0.2500 mg | ORAL_TABLET | Freq: Two times a day (BID) | ORAL | Status: DC | PRN

## 2018-04-18 MED ORDER — OXYCODONE-ACETAMINOPHEN 5-325 MG PO TABS
1.0000 | ORAL_TABLET | ORAL | Status: DC | PRN
  Administered 2018-04-18 – 2018-04-19 (×2): 1 via ORAL
  Filled 2018-04-18 (×2): qty 1

## 2018-04-18 MED ORDER — TIOTROPIUM BROMIDE MONOHYDRATE 18 MCG IN CAPS
18.0000 ug | ORAL_CAPSULE | Freq: Every day | RESPIRATORY_TRACT | Status: DC
  Administered 2018-04-19: 18 ug via RESPIRATORY_TRACT
  Filled 2018-04-18: qty 5

## 2018-04-18 MED ORDER — ASPIRIN 81 MG PO CHEW
81.0000 mg | CHEWABLE_TABLET | Freq: Every day | ORAL | Status: DC
  Administered 2018-04-19: 81 mg via ORAL
  Filled 2018-04-18: qty 1

## 2018-04-18 MED ORDER — ACETAMINOPHEN 325 MG PO TABS: 650.0000 mg | ORAL_TABLET | ORAL | Status: DC | PRN

## 2018-04-18 MED ORDER — LISINOPRIL 5 MG PO TABS
2.5000 mg | ORAL_TABLET | Freq: Every day | ORAL | Status: DC
  Administered 2018-04-19: 2.5 mg via ORAL
  Filled 2018-04-18: qty 1

## 2018-04-18 MED ORDER — NITROGLYCERIN 0.4 MG SL SUBL: 0.4000 mg | SUBLINGUAL_TABLET | SUBLINGUAL | Status: DC | PRN

## 2018-04-18 NOTE — Consult Note (Signed)
[facility] Cardiology  CARDIOLOGY CONSULT NOTE  Patient ID: Lawrence [name] MRN: [id number] DOB/AGE: 23-Oct-1956 62 y.o.  Admit date: 04/18/2018 Referring Physician Corky Downs Primary Physician [name] Primary Cardiologist [name] Reason for Consultation chest pain  HPI: 62 year old gentleman referred for evaluation of chest pain.  The patient has known coronary artery disease, status post CABG times 12/2000 at [facility], status post multiple coronary stents, most recently drug-eluting stent SVG to OM 112/18/2017 at [facility].  The patient was at the congestive heart failure clinic earlier today where he complained of substernal chest discomfort and was sent to [facility] emergency room.  ECG reveals normal sinus rhythm without acute ischemic ST-T wave changes.  Initial troponin was less than 0.03.  The patient describes substernal chest tightness with shooting pains radiating across his chest.  The patient reports on and off chest discomfort for 2-3 days.  Review of systems complete and found to be negative unless listed above     Past Medical History:  Diagnosis Date  . A-fib ([place])   . Anemia   . Anginal pain ([place])   . Anxiety   . Arthritis   . Asthma   . CAD (coronary artery disease)    a. 2002 CABGx2 (LIMA->LAD, VG->VG->OM1);  b. 09/2012 DES->OM;  c. 03/2015 PTCA of LAD [facility]) in setting of atretic LIMA; d. 05/2015 Cath [facility]): nonobs dzs; e. 06/2015 Cath ([facility]): LM nl, LAD 45p/d ISR, 50d, D1/2 small, LCX 50p/d ISR, OM1 70ost, 30 ISR, VG->OM1 50ost, 33m LIMA->LAD 99p/d - atretic, RCA dom, nl; f.cath 10/16: 40-50%(FFR 0.90) pLAD, 75% (FFR 0.77) mLAD s/p PCI/DES, oRCA 40% (FFR0.95)  . Celiac disease   . Chronic diastolic CHF (congestive heart failure) ([place]    a. 06/2009 Echo: EF 60-65%, Gr 1 DD, triv AI, mildly dil LA, nl RV.  .Marland KitchenCOPD (chronic obstructive pulmonary disease) ([place]    a. Chronic bronchitis and emphysema.  . Diverticulosis   . Dysrhythmia   . Essential hypertension   . GERD  (gastroesophageal reflux disease)   . History of tobacco abuse    a. Quit 2014.  .Marland KitchenPancreatitis   . PSVT (paroxysmal supraventricular tachycardia) ([place]    a. 10/2012 Noted on Zio Patch.  . Tubular adenoma of colon   . Type II diabetes mellitus ([place]     Past Surgical History:  Procedure Laterality Date  . BYPASS GRAFT    . CARDIAC CATHETERIZATION N/A 07/12/2015   rocedure: Left Heart Cath and Cors/Grafts Angiography;  Surgeon: HBelva Crome MD;  [place];  Service: Cardiovascular;  Laterality: N/A;  . CARDIAC CATHETERIZATION Right 10/07/2015   Procedure: Left Heart Cath and Cors/Grafts Angiography;  Surgeon: SDionisio David MD;  [place];  Service: Cardiovascular;  Laterality: Right;  . CARDIAC CATHETERIZATION N/A 04/06/2016   Procedure: Left Heart Cath and Coronary Angiography;  Surgeon: DYolonda Kida MD;  [place];  Service: Cardiovascular;  Laterality: N/A;  . CARDIAC CATHETERIZATION  04/06/2016   Procedure: Bypass Graft Angiography;  Surgeon: DYolonda Kida MD;  [place];  Service: Cardiovascular;;  . CARDIAC CATHETERIZATION N/A 11/02/2016   Procedure: Left Heart Cath and Cors/Grafts Angiography and possible PCI;  Surgeon: DYolonda Kida MD;  [place];  Service: Cardiovascular;  Laterality: N/A;  . CARDIAC CATHETERIZATION N/A 11/02/2016   Procedure: Coronary Stent Intervention;  Surgeon: DYolonda Kida MD;  [place];  Service: Cardiovascular;  Laterality: N/A;  . CHOLECYSTECTOMY    . COLONOSCOPY WITH PROPOFOL N/A 04/01/2018   Procedure: COLONOSCOPY WITH PROPOFOL;  Surgeon: Manya Silvas, MD;  Location: [facility] ENDOSCOPY;  Service: Endoscopy;  Laterality: N/A;  . ESOPHAGEAL DILATION    . ESOPHAGOGASTRODUODENOSCOPY (EGD) WITH PROPOFOL N/A 04/01/2018   Procedure: ESOPHAGOGASTRODUODENOSCOPY (EGD) WITH PROPOFOL;  Surgeon: Manya Silvas, MD;   Location: [facility] ENDOSCOPY;  Service: Endoscopy;  Laterality: N/A;  . TONSILLECTOMY    . VASCULAR SURGERY       (Not in a hospital admission) Social History   Socioeconomic History  . Marital status: Married    Spouse name: Not on file  . Number of children: 1  . Years of education: Not on file  . Highest education level: 10th grade  Occupational History  . Occupation: Disabled  Social Needs  . Financial resource strain: Not hard at all  . Food insecurity:    Worry: Never true    Inability: Never true  . Transportation needs:    Medical: No    Non-medical: No  Tobacco Use  . Smoking status: Former Smoker    Last attempt to quit: 04/22/2013    Years since quitting: 4.9  . Smokeless tobacco: Never Used  Substance and Sexual Activity  . Alcohol use: No    Comment: remotely quit alcohol use. Hx of heavy alcohol use.  . Drug use: No  . Sexual activity: Not on file  Lifestyle  . Physical activity:    Days per week: Not on file    Minutes per session: Not on file  . Stress: Only a little  Relationships  . Social connections:    Talks on phone: Not on file    Gets together: Not on file    Attends religious service: Not on file    Active member of club or organization: Not on file    Attends meetings of clubs or organizations: Not on file    Relationship status: Not on file  . Intimate partner violence:    Fear of current or ex partner: Not on file    Emotionally abused: Not on file    Physically abused: Not on file    Forced sexual activity: Not on file  Other Topics Concern  . Not on file  Social History Narrative   Pt lives in [place] with wife.  Does not routinely exercise.    Family History  Problem Relation Age of Onset  . Heart attack Mother   . Depression Mother   . Heart disease Mother   . COPD Mother   . Hypertension Mother   . Heart attack Father   . Diabetes Father   . Depression Father   . Heart disease Father   . Cirrhosis Father   .  Parkinson's disease Brother       Review of systems complete and found to be negative unless listed above      PHYSICAL EXAM  General: Well developed, well nourished, in no acute distress HEENT:  Normocephalic and atramatic Neck:  No JVD.  Lungs: Clear bilaterally to auscultation and percussion. Heart: HRRR . Normal S1 and S2 without gallops or murmurs.  Abdomen: Bowel sounds are positive, abdomen soft and non-tender  Msk:  Back normal, normal gait. Normal strength and tone for age. Extremities: No clubbing, cyanosis or edema.   Neuro: Alert and oriented X 3. Psych:  Good affect, responds appropriately  Labs:   Lab Results  Component Value Date   WBC  5.4 04/18/2018   HGB 17.4 04/18/2018   HCT 51.4 04/18/2018   MCV 91.4 04/18/2018   PLT 195 04/18/2018   Recent Labs  Lab 04/18/18 1007  NA 139  K 4.8  CL 99*  CO2 29  BUN 16  CREATININE 1.26*  CALCIUM 9.4  PROT 7.2  BILITOT 1.2  ALKPHOS 74  ALT 25  AST 21  GLUCOSE 177*   Lab Results  Component Value Date   CKTOTAL 93 05/23/2014   CKMB 1.7 05/23/2014   TROPONINI <0.03 04/18/2018    Lab Results  Component Value Date   CHOL 151 01/10/2018   CHOL 157 10/31/2016   CHOL 146 10/27/2016   Lab Results  Component Value Date   HDL 38 (L) 01/10/2018   HDL 30 (L) 10/31/2016   HDL 39 (L) 10/27/2016   Lab Results  Component Value Date   LDLCALC 56 01/10/2018   LDLCALC UNABLE TO CALCULATE IF TRIGLYCERIDE OVER 400 mg/dL 10/31/2016   LDLCALC 64 10/27/2016   Lab Results  Component Value Date   TRIG 286 (H) 01/10/2018   TRIG 424 (H) 10/31/2016   TRIG 213 (H) 10/27/2016   Lab Results  Component Value Date   CHOLHDL 4.0 01/10/2018   CHOLHDL 5.2 10/31/2016   CHOLHDL 3.7 10/27/2016   No results found for: LDLDIRECT    Radiology: Dg Chest 2 View  Result Date: 04/18/2018 CLINICAL DATA:  Chest pain and shortness of breath. EXAM: CHEST - 2 VIEW COMPARISON:  12/12/2017 and 11/08/2017 FINDINGS: Heart size and  pulmonary vascularity are normal. No acute infiltrates or effusions. Chronic slight accentuation of the interstitial markings bilaterally. Prior CABG. No significant bone abnormality. IMPRESSION: No active cardiopulmonary disease. Electronically Signed   By: Lorriane Shire M.D.   On: 04/18/2018 10:37   Dg Foot Complete Right  Result Date: 03/21/2018 CLINICAL DATA:  Stepped in hole today injuring right foot EXAM: RIGHT FOOT COMPLETE - 3+ VIEW COMPARISON:  None. FINDINGS: Tarsal-metatarsal alignment is normal. There is only mild degenerative joint disease of the right first MTP joint. No acute fracture is seen. Alignment is normal. Only a small plantar calcaneal degenerative spur is present. IMPRESSION: 1. No acute fracture. 2. Mild degenerative change of the right first MTP joint and small plantar calcaneal degenerative spur. Electronically Signed   By: Ivar Drape M.D.   On: 03/21/2018 16:02    EKG: Normal sinus rhythm without acute ischemic ST-T wave changes  ASSESSMENT AND PLAN:   1.  Chest pain, nondiagnostic ECG, initial negative troponin 2.  Known CAD, status post CABG, multiple stents, most recently DES SVG to OM1 3.  Paroxysmal atrial fibrillation, on Eliquis for stroke prevention 4.  Chronic diastolic CHF  Recommendations  1.  Agree with current therapy 2.  Continue to cycle cardiac isoenzymes 3.  Defer further anticoagulation 4.  Hold Eliquis for now 5.  Lexiscan Myoview in a.m.  Signed: Isaias Cowman MD,PhD, [facility] 04/18/2018, 1:35 PM

## 2018-04-18 NOTE — H&P (Signed)
[facility] NAME: Lawrence [name]    MR#:  [id number]  DATE OF BIRTH:  May 22, 1956  DATE OF ADMISSION:  04/18/2018  PRIMARY CARE PHYSICIAN: Birdie Sons, MD   REQUESTING/REFERRING PHYSICIAN:   CHIEF COMPLAINT:   Chief Complaint  Patient presents with  . Chest Pain    HISTORY OF PRESENT ILLNESS: Lawrence [name]  is a 62 y.o. male with a known history of atrial fibrillation on Eliquis for anticoagulation, coronary artery disease, arthritis, chronic diastolic heart failure, hypertension presented to the emergency room with chest pain.  Chest pain started on Friday and became worse until Monday.  The pain is 7 out of 10 on a scale of 1-10 located in the left side of chest and radiates to the left shoulder.  No complaints of any shortness of breath.  EKG normal sinus rhythm with no ST segment changes.  First set of troponin is negative patient seen by cardiology in the emergency room and recommended cardiac stress testing in the morning.  PAST MEDICAL HISTORY:   Past Medical History:  Diagnosis Date  . A-fib ([place])   . Anemia   . Anginal pain ([place])   . Anxiety   . Arthritis   . Asthma   . CAD (coronary artery disease)    a. 2002 CABGx2 (LIMA->LAD, VG->VG->OM1);  b. 09/2012 DES->OM;  c. 03/2015 PTCA of LAD [facility]) in setting of atretic LIMA; d. 05/2015 Cath [facility]): nonobs dzs; e. 06/2015 Cath ([facility]): LM nl, LAD 45p/d ISR, 50d, D1/2 small, LCX 50p/d ISR, OM1 70ost, 30 ISR, VG->OM1 50ost, 56m LIMA->LAD 99p/d - atretic, RCA dom, nl; f.cath 10/16: 40-50%(FFR 0.90) pLAD, 75% (FFR 0.77) mLAD s/p PCI/DES, oRCA 40% (FFR0.95)  . Celiac disease   . Chronic diastolic CHF (congestive heart failure) ([place]    a. 06/2009 Echo: EF 60-65%, Gr 1 DD, triv AI, mildly dil LA, nl RV.  .Marland KitchenCOPD (chronic obstructive pulmonary disease) ([place]    a. Chronic bronchitis and emphysema.  . Diverticulosis   . Dysrhythmia   . Essential hypertension   . GERD  (gastroesophageal reflux disease)   . History of tobacco abuse    a. Quit 2014.  .Marland KitchenPancreatitis   . PSVT (paroxysmal supraventricular tachycardia) ([place]    a. 10/2012 Noted on Zio Patch.  . Tubular adenoma of colon   . Type II diabetes mellitus ([place]     PAST SURGICAL HISTORY:  Past Surgical History:  Procedure Laterality Date  . BYPASS GRAFT    . CARDIAC CATHETERIZATION N/A 07/12/2015   rocedure: Left Heart Cath and Cors/Grafts Angiography;  Surgeon: HBelva Crome MD;  [place];  Service: Cardiovascular;  Laterality: N/A;  . CARDIAC CATHETERIZATION Right 10/07/2015   Procedure: Left Heart Cath and Cors/Grafts Angiography;  Surgeon: SDionisio David MD;  [place] [facility];  Service: Cardiovascular;  Laterality: Right;  . CARDIAC CATHETERIZATION N/A 04/06/2016   Procedure: Left Heart Cath and Coronary Angiography;  Surgeon: DYolonda Kida MD;  [place] [facility];  Service: Cardiovascular;  Laterality: N/A;  . CARDIAC CATHETERIZATION  04/06/2016   Procedure: Bypass Graft Angiography;  Surgeon: DYolonda Kida MD;  [place] [facility];  Service: Cardiovascular;;  . CARDIAC CATHETERIZATION N/A 11/02/2016   Procedure: Left Heart Cath and Cors/Grafts Angiography and possible PCI;  Surgeon: DYolonda Kida MD;  [place] [facility];  Service: Cardiovascular;  Laterality: N/A;  .  CARDIAC CATHETERIZATION N/A 11/02/2016   Procedure: Coronary Stent Intervention;  Surgeon: Yolonda Kida, MD;  Location: [place] [facility] LAB;  Service: Cardiovascular;  Laterality: N/A;  . CHOLECYSTECTOMY    . COLONOSCOPY WITH PROPOFOL N/A 04/01/2018   Procedure: COLONOSCOPY WITH PROPOFOL;  Surgeon: Manya Silvas, MD;  [facility];  Service: Endoscopy;  Laterality: N/A;  . ESOPHAGEAL DILATION    . ESOPHAGOGASTRODUODENOSCOPY (EGD) WITH PROPOFOL N/A 04/01/2018   Procedure: ESOPHAGOGASTRODUODENOSCOPY (EGD) WITH PROPOFOL;   Surgeon: Manya Silvas, MD;  Location: [facility] ENDOSCOPY;  Service: Endoscopy;  Laterality: N/A;  . TONSILLECTOMY    . VASCULAR SURGERY      SOCIAL HISTORY:  Social History   Tobacco Use  . Smoking status: Former Smoker    Last attempt to quit: 04/22/2013    Years since quitting: 4.9  . Smokeless tobacco: Never Used  Substance Use Topics  . Alcohol use: No    Comment: remotely quit alcohol use. Hx of heavy alcohol use.    FAMILY HISTORY:  Family History  Problem Relation Age of Onset  . Heart attack Mother   . Depression Mother   . Heart disease Mother   . COPD Mother   . Hypertension Mother   . Heart attack Father   . Diabetes Father   . Depression Father   . Heart disease Father   . Cirrhosis Father   . Parkinson's disease Brother     DRUG ALLERGIES:  Allergies  Allergen Reactions  . Demerol [Meperidine] Hives  . Prednisone Other (See Comments) and Hypertension    Pt states that this medication puts him in A-fib   . Sulfa Antibiotics Hives  . Albuterol Sulfate [Albuterol] Palpitations and Other (See Comments)    Pt currently uses this medication.    . Morphine Sulfate Nausea And Vomiting, Rash and Other (See Comments)    Pt states that he is only allergic to the tablet form of this medication.      REVIEW OF SYSTEMS:   CONSTITUTIONAL: No fever, fatigue or weakness.  EYES: No blurred or double vision.  EARS, NOSE, AND THROAT: No tinnitus or ear pain.  RESPIRATORY: No cough, shortness of breath, wheezing or hemoptysis.  CARDIOVASCULAR: Has chest pain,  No orthopnea, edema.  GASTROINTESTINAL: No nausea, vomiting, diarrhea or abdominal pain.  GENITOURINARY: No dysuria, hematuria.  ENDOCRINE: No polyuria, nocturia,  HEMATOLOGY: No anemia, easy bruising or bleeding SKIN: No rash or lesion. MUSCULOSKELETAL: No joint pain or arthritis.   NEUROLOGIC: No tingling, numbness, weakness.  PSYCHIATRY: No anxiety or depression.   MEDICATIONS AT HOME:  Prior to  Admission medications   Medication Sig Start Date End Date Taking? Authorizing Provider  albuterol (PROVENTIL HFA;VENTOLIN HFA) 108 (90 Base) MCG/ACT inhaler Inhale 2 puffs into the lungs every 4 (four) hours as needed for wheezing or shortness of breath. Reported on 04/08/2016 10/21/17  Yes Birdie Sons, MD  allopurinol (ZYLOPRIM) 100 MG tablet TAKE 3 TABLETS BY MOUTH EVERY DAY. 03/11/18  Yes Birdie Sons, MD  ALPRAZolam Duanne Moron) 1 MG tablet TAKE ONE-HALF TO ONE TABLET BY MOUTH THREE TIMES A DAY AS NEEDED 03/24/18  Yes Birdie Sons, MD  aspirin 81 MG chewable tablet Chew 81 mg by mouth daily.   Yes [provider]  atorvastatin (LIPITOR) 80 MG tablet TAKE ONE TABLET BY MOUTH EVERY NIGHT AT BEDTIME 08/13/17  Yes Birdie Sons, MD  Blood Glucose Monitoring Suppl (ACCU-CHEK AVIVA PLUS) w/Device KIT CHECK BLOOD SUGAR EVERY DAY  12/08/17  Yes Birdie Sons, MD  cetirizine (ZYRTEC) 10 MG tablet TAKE ONE TABLET BY MOUTH AT BEDTIME. Patient taking differently: 1-2 tid prn 05/12/17  Yes Birdie Sons, MD  cyclobenzaprine (FLEXERIL) 5 MG tablet TAKE 1 TO 2 TABLETS BY MOUTH THREE TIMESDAILY AS NEEDED (BACK PAIN) 11/15/17  Yes Birdie Sons, MD  docusate sodium (COLACE) 100 MG capsule Take 100 mg by mouth daily.    Yes [provider]  ELIQUIS 5 MG TABS tablet TAKE 1 TABLET BY MOUTH TWICE A DAY. 07/28/17  Yes Birdie Sons, MD  fluticasone furoate-vilanterol (BREO ELLIPTA) 100-25 MCG/INH AEPB Inhale 1 puff into the lungs daily. 01/18/18  Yes Devona Konig A, MD  glucose blood (ACCU-CHEK AVIVA PLUS) test strip Use to check blood sugar once a day 02/14/18  Yes Birdie Sons, MD  isosorbide mononitrate (IMDUR) 60 MG 24 hr tablet Take 1 tablet (60 mg total) by mouth daily. Patient taking differently: Take 60 mg by mouth 2 (two) times daily.  11/04/16  Yes Henreitta Leber, MD  lisinopril (PRINIVIL,ZESTRIL) 2.5 MG tablet TAKE 1 TABLET BY MOUTH EVERY DAY 03/11/18  Yes Birdie Sons,  MD  Magnesium Oxide 400 (240 Mg) MG TABS TAKE ONE TABLET BY MOUTH DAILY 03/23/17  Yes Birdie Sons, MD  metFORMIN (GLUCOPHAGE) 500 MG tablet TAKE 1 TABLET BY MOUTH EVERY MORNING 11/30/17  Yes Birdie Sons, MD  nitroGLYCERIN (NITROSTAT) 0.4 MG SL tablet Place 1 tablet (0.4 mg total) under the tongue every 5 (five) minutes as needed for chest pain. Reported on 05/26/2016 10/06/16  Yes Birdie Sons, MD  omega-3 acid ethyl esters (LOVAZA) 1 g capsule TAKE FOUR CAPSULES BY MOUTH DAILY 06/04/17  Yes Birdie Sons, MD  oxyCODONE-acetaminophen (PERCOCET) 10-325 MG tablet Take 1 tablet by mouth every 4 (four) hours as needed. 03/22/18  Yes Birdie Sons, MD  pantoprazole (PROTONIX) 40 MG tablet Take 40 mg by mouth daily.    Yes [provider]  RANEXA 500 MG 12 hr tablet Take 1,000 mg by mouth 2 (two) times daily.   Yes [provider]  sotalol (BETAPACE) 120 MG tablet Take 120 mg by mouth 2 (two) times daily.    Yes [provider]  SPIRIVA HANDIHALER 18 MCG inhalation capsule INHALE ONE CAPSULE AS DIRECTED ONCE A DAY 02/03/18  Yes Birdie Sons, MD  tamsulosin (FLOMAX) 0.4 MG CAPS capsule Take 0.4 mg by mouth daily.    Yes [provider]  torsemide (DEMADEX) 100 MG tablet Take 0.5 tablets by mouth daily. 10/11/17  Yes [provider]  albuterol (PROVENTIL) (2.5 MG/3ML) 0.083% nebulizer solution Take 3 mLs (2.5 mg total) by nebulization every 6 (six) hours as needed for wheezing or shortness of breath. 01/04/18   Birdie Sons, MD      PHYSICAL EXAMINATION:   VITAL SIGNS: Blood pressure (!) 155/93, pulse (!) 55, temperature 98.7 F (37.1 C), temperature source Oral, resp. rate 14, height 5' 7" (1.702 m), weight 113.4 kg (250 lb), SpO2 94 %.  GENERAL:  62 y.o.-year-old patient lying in the bed with no acute distress.  EYES: Pupils equal, round, reactive to light and accommodation. No scleral icterus. Extraocular muscles intact.  HEENT: Head  atraumatic, normocephalic. Oropharynx and nasopharynx clear.  NECK:  Supple, no jugular venous distention. No thyroid enlargement, no tenderness.  LUNGS: Normal breath sounds bilaterally, no wheezing, rales,rhonchi or crepitation. No use of accessory muscles of respiration.  CARDIOVASCULAR: S1,  S2 irregular. No murmurs, rubs, or gallops.  ABDOMEN: Soft, nontender, nondistended. Bowel sounds present. No organomegaly or mass.  EXTREMITIES: No pedal edema, cyanosis, or clubbing.  NEUROLOGIC: Cranial nerves II through XII are intact. Muscle strength 5/5 in all extremities. Sensation intact. Gait not checked.  PSYCHIATRIC: The patient is alert and oriented x 3.  SKIN: No obvious rash, lesion, or ulcer.   LABORATORY PANEL:   CBC Recent Labs  Lab 04/18/18 1007  WBC 5.4  HGB 17.4  HCT 51.4  PLT 195  MCV 91.4  MCH 30.9  MCHC 33.8  RDW 16.1*   ------------------------------------------------------------------------------------------------------------------  Chemistries  Recent Labs  Lab 04/18/18 1007  NA 139  K 4.8  CL 99*  CO2 29  GLUCOSE 177*  BUN 16  CREATININE 1.26*  CALCIUM 9.4  AST 21  ALT 25  ALKPHOS 74  BILITOT 1.2   ------------------------------------------------------------------------------------------------------------------ estimated creatinine clearance is 71.2 mL/min (A) (by C-G formula based on SCr of 1.26 mg/dL (H)). ------------------------------------------------------------------------------------------------------------------ No results for input(s): TSH, T4TOTAL, T3FREE, THYROIDAB in the last 72 hours.  Invalid input(s): FREET3   Coagulation profile No results for input(s): INR, PROTIME in the last 168 hours. ------------------------------------------------------------------------------------------------------------------- No results for input(s): DDIMER in the last 72  hours. -------------------------------------------------------------------------------------------------------------------  Cardiac Enzymes Recent Labs  Lab 04/18/18 1007  TROPONINI <0.03   ------------------------------------------------------------------------------------------------------------------ Invalid input(s): POCBNP  ---------------------------------------------------------------------------------------------------------------  Urinalysis    Component Value Date/Time   COLORURINE YELLOW (A) 10/13/2017 1957   APPEARANCEUR CLEAR (A) 10/13/2017 1957   APPEARANCEUR Clear 03/07/2015 2143   LABSPEC 1.008 10/13/2017 1957   LABSPEC 1.012 03/07/2015 2143   PHURINE 6.0 10/13/2017 1957   GLUCOSEU >=500 (A) 10/13/2017 1957   GLUCOSEU Negative 03/07/2015 2143   HGBUR NEGATIVE 10/13/2017 1957   BILIRUBINUR NEGATIVE 10/13/2017 1957   BILIRUBINUR Negative 03/26/2017 0907   BILIRUBINUR Negative 03/07/2015 2143   KETONESUR NEGATIVE 10/13/2017 1957   PROTEINUR NEGATIVE 10/13/2017 1957   UROBILINOGEN 0.2 03/26/2017 0907   NITRITE NEGATIVE 10/13/2017 1957   LEUKOCYTESUR NEGATIVE 10/13/2017 1957   LEUKOCYTESUR Negative 03/07/2015 2143     RADIOLOGY: Dg Chest 2 View  Result Date: 04/18/2018 CLINICAL DATA:  Chest pain and shortness of breath. EXAM: CHEST - 2 VIEW COMPARISON:  12/12/2017 and 11/08/2017 FINDINGS: Heart size and pulmonary vascularity are normal. No acute infiltrates or effusions. Chronic slight accentuation of the interstitial markings bilaterally. Prior CABG. No significant bone abnormality. IMPRESSION: No active cardiopulmonary disease. Electronically Signed   By: Lorriane Shire M.D.   On: 04/18/2018 10:37    EKG: Orders placed or performed during the hospital encounter of 04/18/18  . ED EKG within 10 minutes  . ED EKG within 10 minutes    IMPRESSION AND PLAN:  62 year old male patient with history of atrial fibrillation, coronary artery disease, hypertension,  diastolic heart failure presented to the emergency room with chest pain.  -Chest pain Admit patient to telemetry observation bed Cycle troponin to rule out ischemia  cardiac stress test in a.m. Hold Eliquis as per cardiology recommendation  -Coronary artery disease Continue aspirin, nitrates  -Hypertension stable  -Chronic diastolic heart failure stable Continue torsemide  -DVT prophylaxis sequential compression device to lower extremities  -Chronic atrial fibrillation Continue sotalol   All the records are reviewed and case discussed with ED provider. Management plans discussed with the patient, family and they are in agreement.  CODE STATUS:Full code Code Status History    Date Active Date Inactive Code Status Order ID Comments User Context  10/01/2017 0053 10/01/2017 1758 Full Code [id number]  Lance Coon, MD Inpatient   10/29/2016 2222 11/03/2016 1618 Full Code [id number]  Lance Coon, MD Inpatient   09/03/2016 0143 09/03/2016 1846 Full Code [id number]  Lance Coon, MD ED   06/25/2016 0036 06/26/2016 1535 Full Code [id number]  Lance Coon, MD ED   04/03/2016 2055 04/07/2016 1759 Full Code [id number]  Theodoro Grist, MD Inpatient   03/26/2016 2224 03/28/2016 1800 Full Code [id number]  Lance Coon, MD Inpatient   10/07/2015 0936 10/07/2015 1830 Full Code [id number]  Dionisio David, MD Inpatient   10/07/2015 0826 10/07/2015 0936 Full Code [id number]  Dionisio David, MD Inpatient   10/05/2015 1929 10/07/2015 0826 Full Code [id number]  Idelle Crouch, MD Inpatient   09/12/2015 2129 09/14/2015 1425 Full Code [id number]  Demetrios Loll, MD Inpatient   08/01/2015 0521 08/02/2015 1346 Full Code [id number]  Lance Coon, MD Inpatient   07/12/2015 1524 07/13/2015 2017 Full Code [id number]  Belva Crome, MD Inpatient   07/11/2015 2000 07/12/2015 1524 Full Code [id number]  Reola Mosher Inpatient   06/26/2015 0543 06/27/2015 1530 Full Code [id number]  Juluis Mire, MD Inpatient        TOTAL TIME TAKING CARE OF THIS PATIENT: 50 minutes.    Saundra Shelling M.D on 04/18/2018 at 2:37 PM  Between 7am to 6pm - Pager - [phone]  After 6pm go to [email or web address] - password [email or web address] [place] [place] [facility]  Office  [phone]  CC: Primary care physician; Birdie Sons, MD

## 2018-04-18 NOTE — ED Notes (Signed)
First Nurse Note:  Brought to ED from CHF clinic via [facility] with complaints of chest pain, rates it an 8/10 X 2 days.  Patient states pain is currently "a little stronger".  3 NTG and 4 baby ASA ingested this AM.

## 2018-04-18 NOTE — Progress Notes (Signed)
Patient ID: Lawrence [name], male    DOB: [birth year], 62 y.o.   MRN: [id number]  HPI  Lawrence [name] is a 62 y/o male with a history of DM, PSVT, HTN, COPD (chronic oxygen), CAD with CABG, asthma, anxiety, anemia, atrial fibrillation, obstructive sleep apnea,  past tobacco use and chronic heart failure.   Echo report from 11/10/17 reviewed and showed an EF of 50% along with mild AR/Lawrence/TR. Echo done 03/27/16 and showed an EF of 60-65% along with trivial Lawrence/TR. EF has improved from 45-50% November 2016. Most recent cardiac catheterization was done 11/02/16 and a DES was placed in the SVG to OM1 artery.   Was in the ED 03/25/18 due to ruptured stye of his left upper eyelid. Treated and released. Was in the ED 03/21/18 due to right foot pain after a fall. Xray negative for fracture and he was released.   He presents today for a follow-up visit with a chief complaint of chest pain for the last 2 days radiating down his left arm. Describes it as an 8 out of 10. Has taken 3 SL NTG this weekend along with 4 baby aspirin without relief. Took 3 NTG and 4 baby aspirin this morning already and pain continues as an 8. Patient has associated fatigue, cough, sweating, shortness of breath, palpitations, abdominal distention, dizziness and fluctuating weight gain. He's unsure of medications that he takes because he "takes so many".   Past Medical History:  Diagnosis Date  . A-fib ([place])   . Anemia   . Anginal pain ([place])   . Anxiety   . Arthritis   . Asthma   . CAD (coronary artery disease)    a. 2002 CABGx2 (LIMA->LAD, VG->VG->OM1);  b. 09/2012 DES->OM;  c. 03/2015 PTCA of LAD [facility]) in setting of atretic LIMA; d. 05/2015 Cath [facility]): nonobs dzs; e. 06/2015 Cath ([facility]): LM nl, LAD 45p/d ISR, 50d, D1/2 small, LCX 50p/d ISR, OM1 70ost, 30 ISR, VG->OM1 50ost, 81m LIMA->LAD 99p/d - atretic, RCA dom, nl; f.cath 10/16: 40-50%(FFR 0.90) pLAD, 75% (FFR 0.77) mLAD s/p PCI/DES, oRCA 40% (FFR0.95)  . Celiac disease   . Chronic  diastolic CHF (congestive heart failure) ([place]    a. 06/2009 Echo: EF 60-65%, Gr 1 DD, triv AI, mildly dil LA, nl RV.  .Marland KitchenCOPD (chronic obstructive pulmonary disease) ([place]    a. Chronic bronchitis and emphysema.  . Diverticulosis   . Dysrhythmia   . Essential hypertension   . GERD (gastroesophageal reflux disease)   . History of tobacco abuse    a. Quit 2014.  .Marland KitchenPancreatitis   . PSVT (paroxysmal supraventricular tachycardia) ([place]    a. 10/2012 Noted on Zio Patch.  . Tubular adenoma of colon   . Type II diabetes mellitus ([place]    Past Surgical History:  Procedure Laterality Date  . BYPASS GRAFT    . CARDIAC CATHETERIZATION N/A 07/12/2015   rocedure: Left Heart Cath and Cors/Grafts Angiography;  Surgeon: HBelva Crome MD;  [place];  Service: Cardiovascular;  Laterality: N/A;  . CARDIAC CATHETERIZATION Right 10/07/2015   Procedure: Left Heart Cath and Cors/Grafts Angiography;  Surgeon: SDionisio David MD;  [place];  Service: Cardiovascular;  Laterality: Right;  . CARDIAC CATHETERIZATION N/A 04/06/2016   Procedure: Left Heart Cath and Coronary Angiography;  Surgeon: DYolonda Kida MD;  [place];  Service: Cardiovascular;  Laterality: N/A;  . CARDIAC CATHETERIZATION  04/06/2016   Procedure: Bypass Graft  Angiography;  Surgeon: Yolonda Kida, MD;  Location: [place] CV LAB;  Service: Cardiovascular;;  . CARDIAC CATHETERIZATION N/A 11/02/2016   Procedure: Left Heart Cath and Cors/Grafts Angiography and possible PCI;  Surgeon: Yolonda Kida, MD;  Location: [place] CV LAB;  Service: Cardiovascular;  Laterality: N/A;  . CARDIAC CATHETERIZATION N/A 11/02/2016   Procedure: Coronary Stent Intervention;  Surgeon: Yolonda Kida, MD;  Location: [place] CV LAB;  Service: Cardiovascular;  Laterality: N/A;  . CHOLECYSTECTOMY    . COLONOSCOPY WITH PROPOFOL N/A 04/01/2018   Procedure: COLONOSCOPY WITH PROPOFOL;   Surgeon: Manya Silvas, MD;  Location: [facility] ENDOSCOPY;  Service: Endoscopy;  Laterality: N/A;  . ESOPHAGEAL DILATION    . ESOPHAGOGASTRODUODENOSCOPY (EGD) WITH PROPOFOL N/A 04/01/2018   Procedure: ESOPHAGOGASTRODUODENOSCOPY (EGD) WITH PROPOFOL;  Surgeon: Manya Silvas, MD;  Location: [facility] ENDOSCOPY;  Service: Endoscopy;  Laterality: N/A;  . TONSILLECTOMY    . VASCULAR SURGERY     Family History  Problem Relation Age of Onset  . Heart attack Mother   . Depression Mother   . Heart disease Mother   . COPD Mother   . Hypertension Mother   . Heart attack Father   . Diabetes Father   . Depression Father   . Heart disease Father   . Cirrhosis Father   . Parkinson's disease Brother    Social History   Tobacco Use  . Smoking status: Former Smoker    Last attempt to quit: 04/22/2013    Years since quitting: 4.9  . Smokeless tobacco: Never Used  Substance Use Topics  . Alcohol use: No    Comment: remotely quit alcohol use. Hx of heavy alcohol use.   Allergies  Allergen Reactions  . Demerol [Meperidine] Hives  . Prednisone Other (See Comments) and Hypertension    Pt states that this medication puts him in A-fib   . Sulfa Antibiotics Hives  . Albuterol Sulfate [Albuterol] Palpitations and Other (See Comments)    Pt currently uses this medication.    . Morphine Sulfate Nausea And Vomiting, Rash and Other (See Comments)    Pt states that he is only allergic to the tablet form of this medication.     Prior to Admission medications   Medication Sig Start Date End Date Taking? Authorizing Provider  albuterol (PROVENTIL HFA;VENTOLIN HFA) 108 (90 Base) MCG/ACT inhaler Inhale 2 puffs into the lungs every 4 (four) hours as needed for wheezing or shortness of breath. Reported on 04/08/2016 10/21/17  Yes Birdie Sons, MD  albuterol (PROVENTIL) (2.5 MG/3ML) 0.083% nebulizer solution Take 3 mLs (2.5 mg total) by nebulization every 6 (six) hours as needed for wheezing or shortness of  breath. 01/04/18  Yes Birdie Sons, MD  allopurinol (ZYLOPRIM) 100 MG tablet TAKE 3 TABLETS BY MOUTH EVERY DAY. 03/11/18  Yes Birdie Sons, MD  ALPRAZolam Duanne Moron) 1 MG tablet TAKE ONE-HALF TO ONE TABLET BY MOUTH THREE TIMES A DAY AS NEEDED 03/24/18  Yes Birdie Sons, MD  aspirin 81 MG chewable tablet Chew 81 mg by mouth daily.   Yes [provider]  atorvastatin (LIPITOR) 80 MG tablet TAKE ONE TABLET BY MOUTH EVERY NIGHT AT BEDTIME 08/13/17  Yes Birdie Sons, MD  Blood Glucose Monitoring Suppl (ACCU-CHEK AVIVA PLUS) w/Device KIT CHECK BLOOD SUGAR EVERY DAY 12/08/17  Yes Birdie Sons, MD  cetirizine (ZYRTEC) 10 MG tablet TAKE ONE TABLET BY MOUTH AT BEDTIME. Patient taking differently: 1-2 tid prn 05/12/17  Yes Birdie Sons, MD  cyclobenzaprine (FLEXERIL) 5 MG tablet TAKE 1 TO 2 TABLETS BY MOUTH THREE TIMESDAILY AS NEEDED (BACK PAIN) 11/15/17  Yes Birdie Sons, MD  docusate sodium (COLACE) 100 MG capsule Take 100 mg by mouth daily.    Yes [provider]  ELIQUIS 5 MG TABS tablet TAKE 1 TABLET BY MOUTH TWICE A DAY. 07/28/17  Yes Birdie Sons, MD  EMPAGLIFLOZIN PO Take 10 mg by mouth daily.   Yes [provider]  fluticasone furoate-vilanterol (BREO ELLIPTA) 100-25 MCG/INH AEPB Inhale 1 puff into the lungs daily. 01/18/18  Yes Devona Konig A, MD  glucose blood (ACCU-CHEK AVIVA PLUS) test strip Use to check blood sugar once a day 02/14/18  Yes Birdie Sons, MD  isosorbide mononitrate (IMDUR) 60 MG 24 hr tablet Take 1 tablet (60 mg total) by mouth daily. Patient taking differently: Take 60 mg by mouth 2 (two) times daily.  11/04/16  Yes Henreitta Leber, MD  lisinopril (PRINIVIL,ZESTRIL) 2.5 MG tablet TAKE 1 TABLET BY MOUTH EVERY DAY 03/11/18  Yes Birdie Sons, MD  Magnesium Oxide 400 (240 Mg) MG TABS TAKE ONE TABLET BY MOUTH DAILY 03/23/17  Yes Birdie Sons, MD  metFORMIN (GLUCOPHAGE) 500 MG tablet TAKE 1 TABLET BY MOUTH EVERY MORNING 11/30/17  Yes  Birdie Sons, MD  nitroGLYCERIN (NITROSTAT) 0.4 MG SL tablet Place 1 tablet (0.4 mg total) under the tongue every 5 (five) minutes as needed for chest pain. Reported on 05/26/2016 10/06/16  Yes Birdie Sons, MD  omega-3 acid ethyl esters (LOVAZA) 1 g capsule TAKE FOUR CAPSULES BY MOUTH DAILY 06/04/17  Yes Birdie Sons, MD  oxyCODONE-acetaminophen (PERCOCET) 10-325 MG tablet Take 1 tablet by mouth every 4 (four) hours as needed. 03/22/18  Yes Birdie Sons, MD  pantoprazole (PROTONIX) 20 MG tablet Take 20 mg by mouth daily.   Yes [provider]  RANEXA 500 MG 12 hr tablet Take 1,000 mg by mouth 2 (two) times daily.   Yes [provider]  SPIRIVA HANDIHALER 18 MCG inhalation capsule INHALE ONE CAPSULE AS DIRECTED ONCE A DAY 02/03/18  Yes Birdie Sons, MD  tamsulosin (FLOMAX) 0.4 MG CAPS capsule Take by mouth.   Yes [provider]  torsemide (DEMADEX) 100 MG tablet Take 0.5 tablets by mouth daily. 10/11/17  Yes [provider]  potassium chloride SA (K-DUR,KLOR-CON) 20 MEQ tablet  08/13/17   [provider]  sotalol (BETAPACE) 120 MG tablet Take 120 mg by mouth 2 (two) times daily.     [provider]    Review of Systems  Constitutional: Positive for fatigue. Negative for appetite change.  HENT: Negative for congestion, rhinorrhea and sore throat.   Eyes: Negative.   Respiratory: Positive for cough and shortness of breath. Negative for chest tightness and wheezing.   Cardiovascular: Positive for chest pain, palpitations (few days ago) and leg swelling (minimal).  Gastrointestinal: Positive for abdominal distention. Negative for abdominal pain.  Endocrine: Negative.   Genitourinary: Negative.   Musculoskeletal: Positive for back pain (chronic). Negative for neck pain.  Skin: Negative.   Allergic/Immunologic: Negative.   Neurological: Positive for light-headedness. Negative for dizziness and weakness.       Sweating   Hematological: Negative for adenopathy. Bruises/bleeds easily.  Psychiatric/Behavioral: Positive for sleep disturbance (wearing oxygen at 2L; not wearing CPAP due to space issues). Negative for dysphoric mood and suicidal ideas. The patient is not nervous/anxious.    Vitals:  04/18/18 0911  BP: (!) 181/85  Pulse: (!) 59  Resp: 18  SpO2: 97%  Weight: 250 lb 6 oz (113.6 kg)  Height: 5' 7"  (1.702 m)   Wt Readings from Last 3 Encounters:  04/18/18 250 lb 6 oz (113.6 kg)  04/01/18 246 lb (111.6 kg)  03/25/18 248 lb (112.5 kg)   Lab Results  Component Value Date   CREATININE 1.37 (H) 12/12/2017   CREATININE 1.21 11/08/2017   CREATININE 1.20 10/13/2017   Physical Exam  Constitutional: He is oriented to person, place, and time. He appears well-developed and well-nourished.  HENT:  Head: Normocephalic and atraumatic.  Neck: Normal range of motion. Neck supple. No JVD present.  Cardiovascular: Regular rhythm. Bradycardia present.  Pulmonary/Chest: Effort normal. He has no wheezes. He has no rales.  Abdominal: Soft. He exhibits distension. There is no tenderness.  Musculoskeletal: He exhibits edema (trace pitting bilateral lower legs). He exhibits no tenderness.  Neurological: He is alert and oriented to person, place, and time.  Skin: Skin is warm and dry.  Psychiatric: He has a normal mood and affect. His behavior is normal. Thought content normal.  Nursing note and vitals reviewed.  Assessment & Plan:  1: Acute on Chronic heart failure with preserved ejection fraction- - NYHA class II - moderately fluid overloaded today - weighing daily and says that his weight is fluctuating. Instructed to call for an overnight weight gain of >2 pounds or a weekly weight gain of >5 pounds.  - weight up 22.4 pounds since he was last here July 2018 and up 5 pounds since pulmonology appointment April 2019 - sending patient to ED due to chest pain - not adding salt and is currently rinsing canned  foods. Reviewed the importance of closely following a 2036m sodium diet - drinking ~ 64 ounces of liquids daily - saw cardiologist ([name] 03/03/18 - BNP 09/02/16 was 34.0  2: Chest pain- - EKG done in the office and doesn't look much different from previous ones - patient has taken 3 SL NTG and 4 baby aspirin this morning and pain continues as an 8 out of 10 - due to that and associated symptoms (sweating, arm pain, abdominal distention, weight gain), CMA took patient to the ED for further work-up  3: HTN- - BP elevated today - saw PCP ([name] 01/24/18 - BMP 12/12/17 reviewed and showed sodium 137, potassium 3.9 and GFR 53  4: COPD- - recently had pulmonary function test - continues to wear oxygen at 2L around the clock - sees pulmonologist (Humphrey Rolls 03/14/18  5: Diabetes-  - fasting glucose at home yesterday was 130  Patient did not bring his medications nor a list. Each medication was verbally reviewed with the patient and he was encouraged to bring the bottles to every visit to confirm accuracy of list.  Will get follow-up scheduled for patient

## 2018-04-18 NOTE — ED Notes (Signed)
Family at bedside sitting with pt

## 2018-04-18 NOTE — ED Provider Notes (Signed)
[facility] Emergency Department Provider Note   ____________________________________________    I have reviewed the triage vital signs and the nursing notes.   HISTORY  Chief Complaint Chest Pain     HPI Lawrence [name] is a 62 y.o. male who presents with complaints of chest pain.  Patient has a history of CAD, follows with Dr. [name] of cardiology.  Last catheterization in 2017 showed moderate disease and patient received 1 stent.  History of a CABG as well.  Reports 2 days of constant chest squeezing pain which is moderate to severe.  Feels similar to prior heart attack.  Has taken 4 nitroglycerin without improvement, took 325 of aspirin as well.  Recent stressor with his wife receiving a medical diagnosis   Past Medical History:  Diagnosis Date  . A-fib ([place])   . Anemia   . Anginal pain ([place])   . Anxiety   . Arthritis   . Asthma   . CAD (coronary artery disease)    a. 2002 CABGx2 (LIMA->LAD, VG->VG->OM1);  b. 09/2012 DES->OM;  c. 03/2015 PTCA of LAD [facility]) in setting of atretic LIMA; d. 05/2015 Cath [facility]): nonobs dzs; e. 06/2015 Cath ([facility]): LM nl, LAD 45p/d ISR, 50d, D1/2 small, LCX 50p/d ISR, OM1 70ost, 30 ISR, VG->OM1 50ost, 55m LIMA->LAD 99p/d - atretic, RCA dom, nl; f.cath 10/16: 40-50%(FFR 0.90) pLAD, 75% (FFR 0.77) mLAD s/p PCI/DES, oRCA 40% (FFR0.95)  . Celiac disease   . Chronic diastolic CHF (congestive heart failure) ([place]    a. 06/2009 Echo: EF 60-65%, Gr 1 DD, triv AI, mildly dil LA, nl RV.  .Marland KitchenCOPD (chronic obstructive pulmonary disease) ([place]    a. Chronic bronchitis and emphysema.  . Diverticulosis   . Dysrhythmia   . Essential hypertension   . GERD (gastroesophageal reflux disease)   . History of tobacco abuse    a. Quit 2014.  .Marland KitchenPancreatitis   . PSVT (paroxysmal supraventricular tachycardia) ([place]    a. 10/2012 Noted on Zio Patch.  . Tubular adenoma of colon   . Type II diabetes mellitus ([facility]     Patient Active  Problem List   Diagnosis Date Noted  . Acute on chronic heart failure ([place] 04/18/2018  . History of adenomatous polyp of colon 04/05/2018  . Abdominal pain, chronic, epigastric 11/06/2017  . Bilateral flank pain 03/24/2017  . Dyspnea 04/03/2016  . Hypotension 04/03/2016  . Chronic kidney disease 04/03/2016  . Anemia 04/03/2016  . Paroxysmal atrial fibrillation ([place] 12/23/2015  . OSA (obstructive sleep apnea) 12/10/2015  . Left inguinal hernia 11/07/2015  . Anxiety 11/07/2015  . Unstable angina ([place] 10/05/2015  . Back pain with left-sided radiculopathy 09/30/2015  . Nocturnal hypoxia 09/06/2015  . BPH (benign prostatic hyperplasia) 08/01/2015  . Chronic diastolic CHF (congestive heart failure) ([place]   . Angina pectoris ([place]   . Chest pain 07/11/2015  . COPD (chronic obstructive pulmonary disease) ([place] 07/03/2015  . CAD (coronary artery disease) 06/26/2015  . HTN (hypertension) 06/26/2015  . DM (diabetes mellitus) ([place] 06/26/2015  . Achalasia 07/24/2014  . GERD (gastroesophageal reflux disease) 06/07/2014  . Former tobacco use 04/11/2013  . HLD (hyperlipidemia) 04/09/2013    Past Surgical History:  Procedure Laterality Date  . BYPASS GRAFT    . CARDIAC CATHETERIZATION N/A 07/12/2015   rocedure: Left Heart Cath and Cors/Grafts Angiography;  Surgeon: HBelva Crome MD;  [place];  Service: Cardiovascular;  Laterality: N/A;  . CARDIAC CATHETERIZATION Right 10/07/2015  Procedure: Left Heart Cath and Cors/Grafts Angiography;  Surgeon: Dionisio David, MD;  Location: [place] CV LAB;  Service: Cardiovascular;  Laterality: Right;  . CARDIAC CATHETERIZATION N/A 04/06/2016   Procedure: Left Heart Cath and Coronary Angiography;  Surgeon: Yolonda Kida, MD;  Location: [place] CV LAB;  Service: Cardiovascular;  Laterality: N/A;  . CARDIAC CATHETERIZATION  04/06/2016   Procedure: Bypass Graft Angiography;  Surgeon: Yolonda Kida, MD;  Location: [place] CV LAB;  Service: Cardiovascular;;  . CARDIAC CATHETERIZATION N/A 11/02/2016   Procedure: Left Heart Cath and Cors/Grafts Angiography and possible PCI;  Surgeon: Yolonda Kida, MD;  Location: [place] CV LAB;  Service: Cardiovascular;  Laterality: N/A;  . CARDIAC CATHETERIZATION N/A 11/02/2016   Procedure: Coronary Stent Intervention;  Surgeon: Yolonda Kida, MD;  Location: [place] CV LAB;  Service: Cardiovascular;  Laterality: N/A;  . CHOLECYSTECTOMY    . COLONOSCOPY WITH PROPOFOL N/A 04/01/2018   Procedure: COLONOSCOPY WITH PROPOFOL;  Surgeon: Manya Silvas, MD;  Location[facility] ENDOSCOPY;  Service: Endoscopy;  Laterality: N/A;  . ESOPHAGEAL DILATION    . ESOPHAGOGASTRODUODENOSCOPY (EGD) WITH PROPOFOL N/A 04/01/2018   Procedure: ESOPHAGOGASTRODUODENOSCOPY (EGD) WITH PROPOFOL;  Surgeon: Manya Silvas, MD;  Location: [facility] ENDOSCOPY;  Service: Endoscopy;  Laterality: N/A;  . TONSILLECTOMY    . VASCULAR SURGERY      Prior to Admission medications   Medication Sig Start Date End Date Taking? Authorizing Provider  albuterol (PROVENTIL HFA;VENTOLIN HFA) 108 (90 Base) MCG/ACT inhaler Inhale 2 puffs into the lungs every 4 (four) hours as needed for wheezing or shortness of breath. Reported on 04/08/2016 10/21/17   Birdie Sons, MD  albuterol (PROVENTIL) (2.5 MG/3ML) 0.083% nebulizer solution Take 3 mLs (2.5 mg total) by nebulization every 6 (six) hours as needed for wheezing or shortness of breath. 01/04/18   Birdie Sons, MD  allopurinol (ZYLOPRIM) 100 MG tablet TAKE 3 TABLETS BY MOUTH EVERY DAY. 03/11/18   Birdie Sons, MD  ALPRAZolam Duanne Moron) 1 MG tablet TAKE ONE-HALF TO ONE TABLET BY MOUTH THREE TIMES A DAY AS NEEDED 03/24/18   Birdie Sons, MD  aspirin 81 MG chewable tablet Chew 81 mg by mouth daily.    [provider]  atorvastatin (LIPITOR) 80 MG tablet TAKE ONE TABLET BY MOUTH EVERY NIGHT AT BEDTIME 08/13/17   Birdie Sons, MD  Blood  Glucose Monitoring Suppl (ACCU-CHEK AVIVA PLUS) w/Device KIT CHECK BLOOD SUGAR EVERY DAY 12/08/17   Birdie Sons, MD  cetirizine (ZYRTEC) 10 MG tablet TAKE ONE TABLET BY MOUTH AT BEDTIME. Patient taking differently: 1-2 tid prn 05/12/17   Birdie Sons, MD  cyclobenzaprine (FLEXERIL) 5 MG tablet TAKE 1 TO 2 TABLETS BY MOUTH THREE TIMESDAILY AS NEEDED (BACK PAIN) 11/15/17   Birdie Sons, MD  docusate sodium (COLACE) 100 MG capsule Take 100 mg by mouth daily.     [provider]  ELIQUIS 5 MG TABS tablet TAKE 1 TABLET BY MOUTH TWICE A DAY. 07/28/17   Birdie Sons, MD  EMPAGLIFLOZIN PO Take 10 mg by mouth daily.    [provider]  fluticasone furoate-vilanterol (BREO ELLIPTA) 100-25 MCG/INH AEPB Inhale 1 puff into the lungs daily. 01/18/18   Devona Konig A, MD  glucose blood (ACCU-CHEK AVIVA PLUS) test strip Use to check blood sugar once a day 02/14/18   Birdie Sons, MD  isosorbide mononitrate (IMDUR) 60 MG 24 hr tablet Take 1 tablet (60  mg total) by mouth daily. Patient taking differently: Take 60 mg by mouth 2 (two) times daily.  11/04/16   Henreitta Leber, MD  lisinopril (PRINIVIL,ZESTRIL) 2.5 MG tablet TAKE 1 TABLET BY MOUTH EVERY DAY 03/11/18   Birdie Sons, MD  Magnesium Oxide 400 (240 Mg) MG TABS TAKE ONE TABLET BY MOUTH DAILY 03/23/17   Birdie Sons, MD  metFORMIN (GLUCOPHAGE) 500 MG tablet TAKE 1 TABLET BY MOUTH EVERY MORNING 11/30/17   Birdie Sons, MD  nitroGLYCERIN (NITROSTAT) 0.4 MG SL tablet Place 1 tablet (0.4 mg total) under the tongue every 5 (five) minutes as needed for chest pain. Reported on 05/26/2016 10/06/16   Birdie Sons, MD  omega-3 acid ethyl esters (LOVAZA) 1 g capsule TAKE FOUR CAPSULES BY MOUTH DAILY 06/04/17   Birdie Sons, MD  oxyCODONE-acetaminophen (PERCOCET) 10-325 MG tablet Take 1 tablet by mouth every 4 (four) hours as needed. 03/22/18   Birdie Sons, MD  pantoprazole (PROTONIX) 20 MG tablet Take 20 mg by mouth daily.     [provider]  potassium chloride SA (K-DUR,KLOR-CON) 20 MEQ tablet  08/13/17   [provider]  RANEXA 500 MG 12 hr tablet Take 1,000 mg by mouth 2 (two) times daily.    [provider]  sotalol (BETAPACE) 120 MG tablet Take 120 mg by mouth 2 (two) times daily.     [provider]  SPIRIVA HANDIHALER 18 MCG inhalation capsule INHALE ONE CAPSULE AS DIRECTED ONCE A DAY 02/03/18   Birdie Sons, MD  tamsulosin (FLOMAX) 0.4 MG CAPS capsule Take by mouth.    [provider]  torsemide (DEMADEX) 100 MG tablet Take 0.5 tablets by mouth daily. 10/11/17   [provider]     Allergies Demerol [meperidine]; Prednisone; Sulfa antibiotics; Albuterol sulfate [albuterol]; and Morphine sulfate  Family History  Problem Relation Age of Onset  . Heart attack Mother   . Depression Mother   . Heart disease Mother   . COPD Mother   . Hypertension Mother   . Heart attack Father   . Diabetes Father   . Depression Father   . Heart disease Father   . Cirrhosis Father   . Parkinson's disease Brother     Social History Social History   Tobacco Use  . Smoking status: Former Smoker    Last attempt to quit: 04/22/2013    Years since quitting: 4.9  . Smokeless tobacco: Never Used  Substance Use Topics  . Alcohol use: No    Comment: remotely quit alcohol use. Hx of heavy alcohol use.  . Drug use: No    Review of Systems  Constitutional: No fever/chills Eyes: No visual changes.  ENT: No sore throat. Cardiovascular: As above Respiratory: Denies shortness of breath. Gastrointestinal: No abdominal pain.     Genitourinary: Negative for dysuria. Musculoskeletal: Negative for back pain. Skin: Negative for rash. Neurological: Negative for headaches or weakness   ____________________________________________   PHYSICAL EXAM:  VITAL SIGNS: ED Triage Vitals  Enc Vitals Group     BP 04/18/18 1005 (!) 186/98     Pulse Rate 04/18/18 1005 60       Resp 04/18/18 1005 18     Temp 04/18/18 1005 98.7 F (37.1 C)     Temp Source 04/18/18 1005 Oral     SpO2 04/18/18 1005 98 %     Weight 04/18/18 1002 113.4 kg (250 lb)     Height 04/18/18 1002 1.702 m (5'  7")     Head Circumference --      Peak Flow --      Pain Score 04/18/18 1002 8     Pain Loc --      Pain Edu? --      Excl. in [place]? --     Constitutional: Alert and oriented. No acute distress. Pleasant and interactive Eyes: Conjunctivae are normal.   Nose: No congestion/rhinnorhea. Mouth/Throat: Mucous membranes are moist.    Cardiovascular: Normal rate, regular rhythm. Grossly normal heart sounds.  Good peripheral circulation. Respiratory: Normal respiratory effort.  No retractions. Lungs CTAB. Gastrointestinal: Soft and nontender. No distention.  No CVA tenderness.  Musculoskeletal: No lower extremity tenderness nor edema.  Warm and well perfused Neurologic:  Normal speech and language. No gross focal neurologic deficits are appreciated.  Skin:  Skin is warm, dry and intact. No rash noted. Psychiatric: Mood and affect are normal. Speech and behavior are normal.  ____________________________________________   LABS (all labs ordered are listed, but only abnormal results are displayed)  Labs Reviewed  CBC - Abnormal; Notable for the following components:      Result Value   RDW 16.1 (*)    All other components within normal limits  COMPREHENSIVE METABOLIC PANEL - Abnormal; Notable for the following components:   Chloride 99 (*)    Glucose, Bld 177 (*)    Creatinine, Ser 1.26 (*)    GFR calc non Af Amer 59 (*)    All other components within normal limits  TROPONIN I   ____________________________________________  EKG  ED ECG REPORT I, Lavonia Drafts, the attending physician, personally viewed and interpreted this ECG.  Date: 04/18/2018  Rhythm: normal sinus rhythm QRS Axis: normal Intervals: normal ST/T Wave abnormalities: normal Narrative  Interpretation: no evidence of acute ischemia  ____________________________________________  RADIOLOGY  Chest x-ray unremarkable ____________________________________________   PROCEDURES  Procedure(s) performed: No  Procedures   Critical Care performed: No ____________________________________________   INITIAL IMPRESSION / ASSESSMENT AND PLAN / ED COURSE  Pertinent labs & imaging results that were available during my care of the patient were reviewed by me and considered in my medical decision making (see chart for details).  Patient presents with chest pain as described above.  No response with nitroglycerin or aspirin.  Initial EKG is reassuring, first troponin is normal.  Given continued pain we will try IV fentanyl and IV Zofran  Patient is high risk for ACS will admit to the hospitalist service.  Discussed with Dr. Saralyn Pilar of cardiology.  No heparin or nitro drip at this time    ____________________________________________   FINAL CLINICAL IMPRESSION(S) / ED DIAGNOSES  Final diagnoses:  Chest pain, unspecified type        Note:  This document was prepared using Dragon voice recognition software and may include unintentional dictation errors.      Lavonia Drafts, MD 04/18/18 1311

## 2018-04-18 NOTE — ED Notes (Signed)
First Nurse Note:  Patient taken to Triage 1 for EKG.

## 2018-04-18 NOTE — Progress Notes (Signed)
Advanced care plan. Purpose of the Encounter: CODE STATUS Parties in Attendance: Patient and family Patient's Decision Capacity: Good Subjective/Patient's story: Presented to the emergency room with chest pain Objective/Medical story Patient will be admitted for chest pain to evaluate for any ischemia Goals of care determination:  Advanced directives and goals of care discussed patient Wants everything done for now which includes cardiac resuscitation and intubation and ventilator if the need arises CODE STATUS: Full code Time spent discussing advanced care planning: 16 minutes

## 2018-04-18 NOTE — ED Notes (Signed)
First Nurse Note:  Patient to room 9, Levada Dy RN aware of placement.

## 2018-04-18 NOTE — ED Notes (Signed)
Attempted to call report on pt, RN unavailable to take report at this time. Ascom number left for RN to call back and get report.

## 2018-04-18 NOTE — ED Triage Notes (Signed)
Intermittent chest pain x 2 days. States today has been constant. States has been under increased stress due to recent diagnosis of his wife's. Arrives from CHF clinic on home 02 2l.

## 2018-04-18 NOTE — Patient Instructions (Signed)
Continue weighing daily and call for an overnight weight gain of > 2 pounds or a weekly weight gain of >5 pounds. 

## 2018-04-19 ENCOUNTER — Ambulatory Visit: Payer: Medicare HMO

## 2018-04-19 ENCOUNTER — Observation Stay
Admit: 2018-04-19 | Discharge: 2018-04-19 | Disposition: A | Discharge status: Home / Self Care | Payer: Medicare HMO | Attending: Internal Medicine | Admitting: Internal Medicine

## 2018-04-19 DIAGNOSIS — R079 Chest pain, unspecified: Secondary | ICD-10-CM | POA: Diagnosis not present

## 2018-04-19 DIAGNOSIS — I25119 Atherosclerotic heart disease of native coronary artery with unspecified angina pectoris: Secondary | ICD-10-CM | POA: Diagnosis not present

## 2018-04-19 DIAGNOSIS — I249 Acute ischemic heart disease, unspecified: Secondary | ICD-10-CM | POA: Diagnosis not present

## 2018-04-19 DIAGNOSIS — I2510 Atherosclerotic heart disease of native coronary artery without angina pectoris: Secondary | ICD-10-CM | POA: Diagnosis not present

## 2018-04-19 DIAGNOSIS — I5032 Chronic diastolic (congestive) heart failure: Secondary | ICD-10-CM | POA: Diagnosis not present

## 2018-04-19 DIAGNOSIS — I10 Essential (primary) hypertension: Secondary | ICD-10-CM | POA: Diagnosis not present

## 2018-04-19 LAB — NM MYOCAR MULTI W/SPECT W/WALL MOTION / EF
Estimated workload: 1 METS
Exercise duration (min): 1 min
Exercise duration (sec): 1 s
LV dias vol: 102 mL (ref 62–150)
LV sys vol: 33 mL
MPHR: 156 {beats}/min
Peak HR: 74 {beats}/min
Percent HR: 47 %
Rest HR: 55 {beats}/min
SDS: 4
SRS: 7
SSS: 10
TID: 1.29

## 2018-04-19 LAB — LIPID PANEL
Cholesterol: 125 mg/dL (ref 0–200)
HDL: 25 mg/dL — ABNORMAL LOW (ref >40)
LDL Cholesterol: 41 mg/dL (ref 0–99)
Total CHOL/HDL Ratio: 5 RATIO
Triglycerides: 297 mg/dL — ABNORMAL HIGH (ref <150)
VLDL: 59 mg/dL — ABNORMAL HIGH (ref 0–40)

## 2018-04-19 LAB — BASIC METABOLIC PANEL
Anion gap: 9 (ref 5–15)
BUN: 18 mg/dL (ref 6–20)
CO2: 29 mmol/L (ref 22–32)
Calcium: 9 mg/dL (ref 8.9–10.3)
Chloride: 99 mmol/L — ABNORMAL LOW (ref 101–111)
Creatinine, Ser: 1.18 mg/dL (ref 0.61–1.24)
GFR calc Af Amer: >60 mL/min (ref >60)
GFR calc non Af Amer: >60 mL/min (ref >60)
Glucose, Bld: 175 mg/dL — ABNORMAL HIGH (ref 65–99)
Potassium: 3.9 mmol/L (ref 3.5–5.1)
Sodium: 137 mmol/L (ref 135–145)

## 2018-04-19 LAB — ECHOCARDIOGRAM COMPLETE
Height: 67 in
Weight: 3979.2 oz

## 2018-04-19 LAB — TROPONIN I: Troponin I: <0.03 ng/mL (ref <0.03)

## 2018-04-19 LAB — HIV ANTIBODY (ROUTINE TESTING W REFLEX): HIV Screen 4th Generation wRfx: NONREACTIVE

## 2018-04-19 MED ORDER — REGADENOSON 0.4 MG/5ML IV SOLN
0.4000 mg | Freq: Once | INTRAVENOUS | Status: AC
  Administered 2018-04-19: 0.4 mg via INTRAVENOUS

## 2018-04-19 MED ORDER — TECHNETIUM TC 99M TETROFOSMIN IV KIT
32.2300 | PACK | Freq: Once | INTRAVENOUS | Status: AC | PRN
  Administered 2018-04-19: 32.23 via INTRAVENOUS

## 2018-04-19 MED ORDER — TECHNETIUM TC 99M TETROFOSMIN IV KIT
13.0000 | PACK | Freq: Once | INTRAVENOUS | Status: AC | PRN
  Administered 2018-04-19: 13.67 via INTRAVENOUS

## 2018-04-19 NOTE — Progress Notes (Signed)
*  PRELIMINARY RESULTS* Echocardiogram 2D Echocardiogram has been performed.  Sherrie Sport 04/19/2018, 11:59 AM

## 2018-04-19 NOTE — Progress Notes (Addendum)
[place] at [place] NAME: Lawrence [name]    MR#:  [id number]  DATE OF BIRTH:  02-18-1956  SUBJECTIVE: 62 year old gentleman admitted for chest pain evaluation.  Had history of CABG, multiple stent placement.  Parents have been negative.  Patient scheduled to have a Lexiscan stress test.  He says he still feels chest discomfort in the middle of the chest but troponins have been negative.  And he appears comfortable.  CHIEF COMPLAINT:   Chief Complaint  Patient presents with  . Chest Pain    REVIEW OF SYSTEMS:   ROS CONSTITUTIONAL: No fever, fatigue or weakness.  EYES: No blurred or double vision.  EARS, NOSE, AND THROAT: No tinnitus or ear pain.  RESPIRATORY: No cough, shortness of breath, wheezing or hemoptysis.  CARDIOVASCULAR: No chest pain, orthopnea, edema.  GASTROINTESTINAL: No nausea, vomiting, diarrhea or abdominal pain.  GENITOURINARY: No dysuria, hematuria.  ENDOCRINE: No polyuria, nocturia,  HEMATOLOGY: No anemia, easy bruising or bleeding SKIN: No rash or lesion. MUSCULOSKELETAL: No joint pain or arthritis.   NEUROLOGIC: No tingling, numbness, weakness.  PSYCHIATRY: No anxiety or depression.   DRUG ALLERGIES:   Allergies  Allergen Reactions  . Demerol [Meperidine] Hives  . Prednisone Other (See Comments) and Hypertension    Pt states that this medication puts him in A-fib   . Sulfa Antibiotics Hives  . Albuterol Sulfate [Albuterol] Palpitations and Other (See Comments)    Pt currently uses this medication.    . Morphine Sulfate Nausea And Vomiting, Rash and Other (See Comments)    Pt states that he is only allergic to the tablet form of this medication.      VITALS:  Blood pressure 118/81, pulse (!) 58, temperature (!) 97.4 F (36.3 C), temperature source Oral, resp. rate 16, height 5' 7"  (1.702 m), weight 112.8 kg (248 lb 11.2 oz), SpO2 92 %.  PHYSICAL EXAMINATION:  GENERAL:  62 y.o.-year-old patient lying  in the bed with no acute distress.  EYES: Pupils equal, round, reactive to light and accommodation. No scleral icterus. Extraocular muscles intact.  HEENT: Head atraumatic, normocephalic. Oropharynx and nasopharynx clear.  NECK:  Supple, no jugular venous distention. No thyroid enlargement, no tenderness.  LUNGS: Normal breath sounds bilaterally, no wheezing, rales,rhonchi or crepitation. No use of accessory muscles of respiration.  CARDIOVASCULAR: S1, S2 normal. No murmurs, rubs, or gallops.  ABDOMEN: Soft, nontender, nondistended. Bowel sounds present. No organomegaly or mass.  EXTREMITIES: No pedal edema, cyanosis, or clubbing.  NEUROLOGIC: Cranial nerves II through XII are intact. Muscle strength 5/5 in all extremities. Sensation intact. Gait not checked.  PSYCHIATRIC: The patient is alert and oriented x 3.  SKIN: No obvious rash, lesion, or ulcer.    LABORATORY PANEL:   CBC Recent Labs  Lab 04/18/18 1007  WBC 5.4  HGB 17.4  HCT 51.4  PLT 195   ------------------------------------------------------------------------------------------------------------------  Chemistries  Recent Labs  Lab 04/18/18 1007 04/19/18 0329  NA 139 137  K 4.8 3.9  CL 99* 99*  CO2 29 29  GLUCOSE 177* 175*  BUN 16 18  CREATININE 1.26* 1.18  CALCIUM 9.4 9.0  AST 21  --   ALT 25  --   ALKPHOS 74  --   BILITOT 1.2  --    ------------------------------------------------------------------------------------------------------------------  Cardiac Enzymes Recent Labs  Lab 04/19/18 0329  TROPONINI <0.03   ------------------------------------------------------------------------------------------------------------------  RADIOLOGY:  Dg Chest 2 View  Result Date: 04/18/2018 CLINICAL DATA:  Chest pain and  shortness of breath. EXAM: CHEST - 2 VIEW COMPARISON:  12/12/2017 and 11/08/2017 FINDINGS: Heart size and pulmonary vascularity are normal. No acute infiltrates or effusions. Chronic slight  accentuation of the interstitial markings bilaterally. Prior CABG. No significant bone abnormality. IMPRESSION: No active cardiopulmonary disease. Electronically Signed   By: Lorriane Shire M.D.   On: 04/18/2018 10:37    EKG:   Orders placed or performed during the hospital encounter of 04/18/18  . ED EKG within 10 minutes  . ED EKG within 10 minutes    ASSESSMENT AND PLAN:   High risk chest pain in a patient with history of CAD, CABG, multiple stents: Patient troponins have been negative, follow Lexiscan stress test results.  Continue all his cardiac meds including aspirin, atorvastatin, nitroglycerin, sotalol, Ranexa . 2.History of COPD, chronic respiratory failure: Patient already on oxygen 2 L at home. #3 /chronic A. fib: Patient Eliquis on hold as per discussion with cardiology.     Addendum: Stress test is normal.  Discharge home today.  Restart Eliquis.  All the records are reviewed and case discussed with Care Management/Social Workerr. Management plans discussed with the patient, family and they are in agreement.  CODE STATUS: fullCode  TOTAL TIME TAKING CARE OF THIS PATIENT: 35 minutes.     DEPENDING ON CLINICAL CONDITION.   Epifanio Lesches M.D on 04/19/2018 at 8:19 AM  Between 7am to 6pm - Pager - [phone]  After 6pm go to [email or web address] - password [email or web address] [facility]  Office  [phone]  CC: Primary care physician; Birdie Sons, MD   Note: This dictation was prepared with Dragon dictation along with smaller phrase technology. Any transcriptional errors that result from this process are unintentional.

## 2018-04-20 ENCOUNTER — Other Ambulatory Visit: Payer: Self-pay | Admitting: Family Medicine

## 2018-04-20 ENCOUNTER — Telehealth: Payer: Self-pay

## 2018-04-20 DIAGNOSIS — J441 Chronic obstructive pulmonary disease with (acute) exacerbation: Secondary | ICD-10-CM

## 2018-04-20 MED ORDER — OXYCODONE-ACETAMINOPHEN 10-325 MG PO TABS: 1.0000 | ORAL_TABLET | ORAL | 0 refills | Status: DC | PRN

## 2018-04-20 NOTE — Discharge Summary (Addendum)
Lawrence [name], is a 62 y.o. male  DOB 03/23/1956  MRN [id number].  Admission date:  04/18/2018  Admitting Physician  Saundra Shelling, MD  Discharge Date:  04/19/2018   Primary MD  Birdie Sons, MD  Recommendations for primary care physician for things to follow:   Follow with PCP in 1 week   Admission Diagnosis  Chest pain, unspecified type [R07.9]   Discharge Diagnosis  Chest pain, unspecified type [R07.9]    Active Problems:   Chest pain      Past Medical History:  Diagnosis Date  . A-fib ([place])   . Anemia   . Anginal pain ([place])   . Anxiety   . Arthritis   . Asthma   . CAD (coronary artery disease)    a. 2002 CABGx2 (LIMA->LAD, VG->VG->OM1);  b. 09/2012 DES->OM;  c. 03/2015 PTCA of LAD [facility]) in setting of atretic LIMA; d. 05/2015 Cath [facility]): nonobs dzs; e. 06/2015 Cath ([facility]): LM nl, LAD 45p/d ISR, 50d, D1/2 small, LCX 50p/d ISR, OM1 70ost, 30 ISR, VG->OM1 50ost, 32m LIMA->LAD 99p/d - atretic, RCA dom, nl; f.cath 10/16: 40-50%(FFR 0.90) pLAD, 75% (FFR 0.77) mLAD s/p PCI/DES, oRCA 40% (FFR0.95)  . Celiac disease   . Chronic diastolic CHF (congestive heart failure) ([place]    a. 06/2009 Echo: EF 60-65%, Gr 1 DD, triv AI, mildly dil LA, nl RV.  .Marland KitchenCOPD (chronic obstructive pulmonary disease) ([place]    a. Chronic bronchitis and emphysema.  . Diverticulosis   . Dysrhythmia   . Essential hypertension   . GERD (gastroesophageal reflux disease)   . History of tobacco abuse    a. Quit 2014.  .Marland KitchenPancreatitis   . PSVT (paroxysmal supraventricular tachycardia) ([place]    a. 10/2012 Noted on Zio Patch.  . Tubular adenoma of colon   . Type II diabetes mellitus ([place]     Past Surgical History:  Procedure Laterality Date  . BYPASS GRAFT    . CARDIAC CATHETERIZATION N/A 07/12/2015   rocedure: Left Heart Cath and Cors/Grafts  Angiography;  Surgeon: HBelva Crome MD;  [place] [place];  Service: Cardiovascular;  Laterality: N/A;  . CARDIAC CATHETERIZATION Right 10/07/2015   Procedure: Left Heart Cath and Cors/Grafts Angiography;  Surgeon: SDionisio David MD;  [place];  Service: Cardiovascular;  Laterality: Right;  . CARDIAC CATHETERIZATION N/A 04/06/2016   Procedure: Left Heart Cath and Coronary Angiography;  Surgeon: DYolonda Kida MD;  [facility];  Service: Cardiovascular;  Laterality: N/A;  . CARDIAC CATHETERIZATION  04/06/2016   Procedure: Bypass Graft Angiography;  Surgeon: DYolonda Kida MD;  [place] [facility];  Service: Cardiovascular;;  . CARDIAC CATHETERIZATION N/A 11/02/2016   Procedure: Left Heart Cath and Cors/Grafts Angiography and possible PCI;  Surgeon: DYolonda Kida MD;  [facility];  Service: Cardiovascular;  Laterality: N/A;  . CARDIAC CATHETERIZATION N/A 11/02/2016   Procedure: Coronary Stent Intervention;  Surgeon: DYolonda Kida MD;  [place] [facility];  Service: Cardiovascular;  Laterality: N/A;  . CHOLECYSTECTOMY    . COLONOSCOPY WITH PROPOFOL N/A 04/01/2018   Procedure: COLONOSCOPY WITH PROPOFOL;  Surgeon: EManya Silvas MD;  [facility];  Service: Endoscopy;  Laterality: N/A;  . ESOPHAGEAL DILATION    . ESOPHAGOGASTRODUODENOSCOPY (EGD) WITH PROPOFOL N/A 04/01/2018   Procedure: ESOPHAGOGASTRODUODENOSCOPY (EGD) WITH PROPOFOL;  Surgeon: EManya Silvas MD;  [facility];  Service: Endoscopy;  Laterality:  N/A;  . TONSILLECTOMY    . VASCULAR SURGERY         History of present illness and  Hospital Course:     Kindly see H&P for history of present illness and admission details, please review complete Labs, Consult reports and Test reports for all details in brief  HPI  from the history and physical done on the day of admission 62 year old male  patient with history of CAD, CABG, PCI came in because of left-sided chest pain that is going on since 2 to 3 days.  Because of his coronary artery disease with CABG, PCI patient is seen by Dr. Saralyn Pilar from cardiology in the emergency room, advised to keep the patient on telemetry.  Patient is admitted to telemetry for chest pain evaluation.   Hospital Course  #1. left-sided chest pain: ; Due to his CAD, CABG, PCI with high risk chest pain patient admitted to telemetry, seen by cardiology.  Patient EKG is nondiagnostic without any acute ST-T changes.  Patient troponins have been negative for 3 times.  Patient has chronic angina.  Seen by cardiology from yesterday with Dr. Saralyn Pilar recommended Lexiscan stress test.  Patient Lexiscan stress test showed low risk scan with EF 55%.  Patient discharged home and advised to continue all his previous medications including aspirin, statins, beta-blockers, ACE inhibitors. 2.  Permanent atrial fibrillation: Rate controlled, continue sotalol, Eliquis. 3.  History of COPD: No wheezing.  Continue his home dose inhalers. 4.  Diabetes mellitus type 2: Resume metformin. 5.  History of anxiety, chronic pain; patient on Xanax, Flexeril, follow-up with PCP regarding his chronic problems. 6: Chronic angina: Patient takes Ranexa, Imdur.     Discharge Condition: Stable   Follow UP  Follow-up Information    [facility] Follow up on 04/27/2018.   Specialty:  Cardiology Why:  at 9:20am Contact information: [facility] [address] [place] [phone]       Birdie Sons, MD. Go on 04/26/2018.   Specialty:  Family Medicine Why:  Appointment Time: 3:40pm Contact information: [address] [place] [address] [phone]             Discharge Instructions  and  Discharge Medications     Allergies as of 04/19/2018      Reactions   Demerol [meperidine]  Hives   Prednisone Other (See Comments), Hypertension   Pt states that this medication puts him in A-fib    Sulfa Antibiotics Hives   Albuterol Sulfate [albuterol] Palpitations, Other (See Comments)   Pt currently uses this medication.     Morphine Sulfate Nausea And Vomiting, Rash, Other (See Comments)   Pt states that he is only allergic to the tablet form of this medication.        Medication List    TAKE these medications   ACCU-CHEK AVIVA PLUS w/Device Kit CHECK BLOOD SUGAR EVERY DAY   albuterol 108 (90 Base) MCG/ACT inhaler Commonly known as:  PROVENTIL HFA;VENTOLIN HFA Inhale 2 puffs into the lungs every 4 (four) hours as needed for wheezing or shortness of breath. Reported on 04/08/2016   albuterol (2.5 MG/3ML) 0.083% nebulizer solution Commonly known as:  PROVENTIL Take 3 mLs (2.5 mg total) by nebulization every 6 (six) hours as needed for wheezing or shortness of breath.   allopurinol 100 MG tablet Commonly known as:  ZYLOPRIM TAKE 3 TABLETS BY MOUTH EVERY DAY.   ALPRAZolam 1 MG tablet Commonly known  as:  XANAX TAKE ONE-HALF TO ONE TABLET BY MOUTH THREE TIMES A DAY AS NEEDED   aspirin 81 MG chewable tablet Chew 81 mg by mouth daily.   atorvastatin 80 MG tablet Commonly known as:  LIPITOR TAKE ONE TABLET BY MOUTH EVERY NIGHT AT BEDTIME   cetirizine 10 MG tablet Commonly known as:  ZYRTEC TAKE ONE TABLET BY MOUTH AT BEDTIME. What changed:    how much to take  how to take this  when to take this   cyclobenzaprine 5 MG tablet Commonly known as:  FLEXERIL TAKE 1 TO 2 TABLETS BY MOUTH THREE TIMESDAILY AS NEEDED (BACK PAIN)   docusate sodium 100 MG capsule Commonly known as:  COLACE Take 100 mg by mouth daily.   ELIQUIS 5 MG Tabs tablet Generic drug:  apixaban TAKE 1 TABLET BY MOUTH TWICE A DAY.   fluticasone furoate-vilanterol 100-25 MCG/INH Aepb Commonly known as:  BREO ELLIPTA Inhale 1 puff into the lungs daily.   glucose blood test  strip Commonly known as:  ACCU-CHEK AVIVA PLUS Use to check blood sugar once a day   isosorbide mononitrate 60 MG 24 hr tablet Commonly known as:  IMDUR Take 1 tablet (60 mg total) by mouth daily. What changed:  when to take this   lisinopril 2.5 MG tablet Commonly known as:  PRINIVIL,ZESTRIL TAKE 1 TABLET BY MOUTH EVERY DAY   Magnesium Oxide 400 (240 Mg) MG Tabs TAKE ONE TABLET BY MOUTH DAILY   metFORMIN 500 MG tablet Commonly known as:  GLUCOPHAGE TAKE 1 TABLET BY MOUTH EVERY MORNING   nitroGLYCERIN 0.4 MG SL tablet Commonly known as:  NITROSTAT Place 1 tablet (0.4 mg total) under the tongue every 5 (five) minutes as needed for chest pain. Reported on 05/26/2016   omega-3 acid ethyl esters 1 g capsule Commonly known as:  LOVAZA TAKE FOUR CAPSULES BY MOUTH DAILY   oxyCODONE-acetaminophen 10-325 MG tablet Commonly known as:  PERCOCET Take 1 tablet by mouth every 4 (four) hours as needed.   pantoprazole 40 MG tablet Commonly known as:  PROTONIX Take 40 mg by mouth daily.   RANEXA 500 MG 12 hr tablet Generic drug:  ranolazine Take 1,000 mg by mouth 2 (two) times daily.   sotalol 120 MG tablet Commonly known as:  BETAPACE Take 120 mg by mouth 2 (two) times daily.   SPIRIVA HANDIHALER 18 MCG inhalation capsule Generic drug:  tiotropium INHALE ONE CAPSULE AS DIRECTED ONCE A DAY   tamsulosin 0.4 MG Caps capsule Commonly known as:  FLOMAX Take 0.4 mg by mouth daily.   torsemide 100 MG tablet Commonly known as:  DEMADEX Take 0.5 tablets by mouth daily.         Diet and Activity recommendation: See Discharge Instructions above   Consults obtained -cardiology   Major procedures and Radiology Reports - PLEASE review detailed and final reports for all details, in brief -     Dg Chest 2 View  Result Date: 04/18/2018 CLINICAL DATA:  Chest pain and shortness of breath. EXAM: CHEST - 2 VIEW COMPARISON:  12/12/2017 and 11/08/2017 FINDINGS: Heart size and  pulmonary vascularity are normal. No acute infiltrates or effusions. Chronic slight accentuation of the interstitial markings bilaterally. Prior CABG. No significant bone abnormality. IMPRESSION: No active cardiopulmonary disease. Electronically Signed   By: Lorriane Shire M.D.   On: 04/18/2018 10:37   Nm Myocar Multi W/spect W/wall Motion / Ef  Result Date: 04/19/2018  Blood pressure demonstrated a normal response to exercise.  Defect 1: There is a small defect of mild severity present in the apex location.  Findings consistent with prior myocardial infarction.  This is a low risk study.  The left ventricular ejection fraction is normal (55-65%).    Dg Foot Complete Right  Result Date: 03/21/2018 CLINICAL DATA:  Stepped in hole today injuring right foot EXAM: RIGHT FOOT COMPLETE - 3+ VIEW COMPARISON:  None. FINDINGS: Tarsal-metatarsal alignment is normal. There is only mild degenerative joint disease of the right first MTP joint. No acute fracture is seen. Alignment is normal. Only a small plantar calcaneal degenerative spur is present. IMPRESSION: 1. No acute fracture. 2. Mild degenerative change of the right first MTP joint and small plantar calcaneal degenerative spur. Electronically Signed   By: Ivar Drape M.D.   On: 03/21/2018 16:02    Micro Results    No results found for this or any previous visit (from the past 240 hour(s)).     Today   Subjective:   Lawrence [name] today had no further chest pain.    Stable for discharge, patient went home.  Objective:   Blood pressure 138/76, pulse 61, temperature 98.1 F (36.7 C), temperature source Oral, resp. rate 16, height 5' 7" (1.702 m), weight 112.6 kg (248 lb 3.8 oz), SpO2 97 %.   Intake/Output Summary (Last 24 hours) at 04/20/2018 0703 Last data filed at 04/19/2018 1416 Gross per 24 hour  Intake 240 ml  Output 1275 ml  Net -1035 ml    Exam Awake Alert, Oriented x 3, No new F.N deficits, Normal affect [place].AT,PERRAL Supple  Neck,No JVD, No cervical lymphadenopathy appriciated.  Symmetrical Chest wall movement, Good air movement bilaterally, CTAB RRR,No Gallops,Rubs or new Murmurs, No Parasternal Heave +ve B.Sounds, Abd Soft, Non tender, No organomegaly appriciated, No rebound -guarding or rigidity. No Cyanosis, Clubbing or edema, No new Rash or bruise  Data Review   CBC w Diff:  Lab Results  Component Value Date   WBC 5.4 04/18/2018   HGB 17.4 04/18/2018   HGB 10.8 (L) 04/15/2016   HCT 51.4 04/18/2018   HCT 34.5 (L) 04/15/2016   PLT 195 04/18/2018   PLT 297 04/15/2016   LYMPHOPCT 28 06/04/2017   LYMPHOPCT 24.2 03/07/2015   MONOPCT 8 06/04/2017   MONOPCT 7.1 03/07/2015   EOSPCT 4 06/04/2017   EOSPCT 4.8 03/07/2015   BASOPCT 1 06/04/2017   BASOPCT 0.7 03/07/2015    CMP:  Lab Results  Component Value Date   NA 137 04/19/2018   NA 143 10/27/2016   NA 143 03/07/2015   K 3.9 04/19/2018   K 3.9 03/07/2015   CL 99 (L) 04/19/2018   CL 107 03/07/2015   CO2 29 04/19/2018   CO2 29 03/07/2015   BUN 18 04/19/2018   BUN 10 10/27/2016   BUN 9 03/07/2015   CREATININE 1.18 04/19/2018   CREATININE 0.84 03/07/2015   PROT 7.2 04/18/2018   PROT 6.8 03/07/2015   ALBUMIN 4.0 04/18/2018   ALBUMIN 4.4 10/27/2016   ALBUMIN 3.8 03/07/2015   BILITOT 1.2 04/18/2018   BILITOT 0.6 03/07/2015   ALKPHOS 74 04/18/2018   ALKPHOS 61 03/07/2015   AST 21 04/18/2018   AST 25 03/07/2015   ALT 25 04/18/2018   ALT 25 03/07/2015  .   Total Time in preparing paper work, data evaluation and todays exam - 25 minutes  Epifanio Lesches M.D on 04/19/2018 at 7:03 AM    Note: This dictation was prepared with Dragon dictation along with  smaller phrase technology. Any transcriptional errors that result from this process are unintentional.

## 2018-04-20 NOTE — Telephone Encounter (Signed)
Pt  needs refill on his Oxycodone [place]

## 2018-04-20 NOTE — Telephone Encounter (Signed)
Transition Care Management Follow-Up Telephone Call   Date discharged and where: [facility] on 04/19/18.  How have you been since you were released from the hospital? Doing good, still having upper abdominal pain. Pt rated his pain level has an 8, but states the Oxycodone is helping. Declines vomiting and diarrhea. Is having some nausea. Pt o call and check in with GI today.  Any patient concerns? None.  Items Reviewed:   Meds: verified  Allergies: verified  Dietary Changes Reviewed: low sodium heart health  Functional Questionnaire:  Independent-I Dependent-D  ADLs:   Dressing- I    Eating- I   Maintaining continence- I   Transferring- I   Transportation- I   Meal Prep- I   Managing Meds- I   Confirmed importance and Date/Time of follow-up visits scheduled: 04/26/18 @ 3:40 PM   Confirmed with patient if condition worsens to call PCP or go to the Emergency Dept. Patient was given office number and encouraged to call back with questions or concerns: YES

## 2018-04-26 ENCOUNTER — Ambulatory Visit (INDEPENDENT_AMBULATORY_CARE_PROVIDER_SITE_OTHER): Payer: Medicare HMO | Admitting: Family Medicine

## 2018-04-26 ENCOUNTER — Encounter: Payer: Self-pay | Admitting: Family Medicine

## 2018-04-26 VITALS — BP 122/70 | HR 61 | Temp 97.5°F | Resp 18 | Wt 251.0 lb

## 2018-04-26 DIAGNOSIS — R079 Chest pain, unspecified: Secondary | ICD-10-CM

## 2018-04-26 DIAGNOSIS — E1159 Type 2 diabetes mellitus with other circulatory complications: Secondary | ICD-10-CM | POA: Diagnosis not present

## 2018-04-26 DIAGNOSIS — G4733 Obstructive sleep apnea (adult) (pediatric): Secondary | ICD-10-CM | POA: Diagnosis not present

## 2018-04-26 DIAGNOSIS — K2100 Gastro-esophageal reflux disease with esophagitis, without bleeding: Secondary | ICD-10-CM

## 2018-04-26 DIAGNOSIS — R110 Nausea: Secondary | ICD-10-CM

## 2018-04-26 DIAGNOSIS — R1013 Epigastric pain: Secondary | ICD-10-CM | POA: Diagnosis not present

## 2018-04-26 DIAGNOSIS — G8929 Other chronic pain: Secondary | ICD-10-CM | POA: Diagnosis not present

## 2018-04-26 LAB — POCT GLYCOSYLATED HEMOGLOBIN (HGB A1C)
Est. average glucose Bld gHb Est-mCnc: 169
Hemoglobin A1C: 7.5 % — AB (ref 4.0–5.6)

## 2018-04-26 MED ORDER — PROMETHAZINE HCL 25 MG PO TABS: 25.0000 mg | ORAL_TABLET | Freq: Three times a day (TID) | ORAL | 1 refills | Status: DC | PRN

## 2018-04-26 NOTE — Progress Notes (Signed)
Patient: Lawrence [name] Male    DOB: Dec 15, 1955   62 y.o.   MRN: [id number] Visit Date: 04/26/2018  Today's Provider: [name], MD   Chief Complaint  Patient presents with  . Hospitalization Follow-up   Subjective:    HPI   Follow up Hospitalization  Patient was admitted to [facility] on 04/18/2018 and discharged on 04/19/2018. He was treated for chest pain . Treatment for this included; patient admitted to telemetry. Patient was discharged home and advised to continue all previous medications. Advised to follow up with pcp in 1 week and follow up with cardiology 04/27/2018. Patient was changed from pantoprazole to Omeprazole and started on Sucralfate by GI since being hospitalized. Telephone follow up was done on 04/20/2018 He reports good compliance with treatment. He reports this condition is Improved. Still has occasionally chest pains which are worse after eating. Patient has some swelling in both legs, along with shortness of breath. He reports having trouble sleeping at night.   He is also due for follow up diabetes, reports his blood sugars have been running higher.   Lab Results  Component Value Date   HGBA1C 6.8 (H) 01/10/2018   Wt Readings from Last 3 Encounters:  04/26/18 251 lb (113.9 kg)  04/19/18 248 lb 3.8 oz (112.6 kg)  04/18/18 250 lb 6 oz (113.6 kg)   He had trial of Jardiance but report he stopped due to developing pain in privates.   ------------------------------------------------------------------------------------  He also reports he has been nauseated for the last week. He has discussed with GI and has follow up next month and plans to discuss further. He would like something to help with nausea.   He also reports that he is missing a piece to the power supply for his CPAP and has not been using it lately. Is having a lot of trouble resting at night and wants something to help sleep better.     Allergies  Allergen Reactions  . Demerol  [Meperidine] Hives  . Prednisone Other (See Comments) and Hypertension    Pt states that this medication puts him in A-fib   . Sulfa Antibiotics Hives  . Albuterol Sulfate [Albuterol] Palpitations and Other (See Comments)    Pt currently uses this medication.    . Morphine Sulfate Nausea And Vomiting, Rash and Other (See Comments)    Pt states that he is only allergic to the tablet form of this medication.       Current Outpatient Medications:  .  albuterol (PROVENTIL HFA;VENTOLIN HFA) 108 (90 Base) MCG/ACT inhaler, Inhale 2 puffs into the lungs every 4 (four) hours as needed for wheezing or shortness of breath. Reported on 04/08/2016, Disp: 1 Inhaler, Rfl: 5 .  albuterol (PROVENTIL) (2.5 MG/3ML) 0.083% nebulizer solution, Take 3 mLs (2.5 mg total) by nebulization every 6 (six) hours as needed for wheezing or shortness of breath., Disp: 150 mL, Rfl: 1 .  allopurinol (ZYLOPRIM) 100 MG tablet, TAKE 3 TABLETS BY MOUTH EVERY DAY., Disp: 90 tablet, Rfl: 4 .  ALPRAZolam (XANAX) 1 MG tablet, TAKE ONE-HALF TO ONE TABLET BY MOUTH THREE TIMES A DAY AS NEEDED, Disp: 90 tablet, Rfl: 5 .  aspirin 81 MG chewable tablet, Chew 81 mg by mouth daily., Disp: , Rfl:  .  atorvastatin (LIPITOR) 80 MG tablet, TAKE ONE TABLET BY MOUTH EVERY NIGHT AT BEDTIME, Disp: 90 tablet, Rfl: 4 .  Blood Glucose Monitoring Suppl (ACCU-CHEK AVIVA PLUS) w/Device KIT, CHECK BLOOD SUGAR EVERY DAY,  Disp: 1 kit, Rfl: 0 .  cetirizine (ZYRTEC) 10 MG tablet, TAKE ONE TABLET BY MOUTH AT BEDTIME. (Patient taking differently: 1-2 tid prn), Disp: 30 tablet, Rfl: 11 .  cyclobenzaprine (FLEXERIL) 5 MG tablet, TAKE 1 TO 2 TABLETS BY MOUTH THREE TIMESDAILY AS NEEDED (BACK PAIN), Disp: 60 tablet, Rfl: 5 .  docusate sodium (COLACE) 100 MG capsule, Take 100 mg by mouth daily. , Disp: , Rfl:  .  ELIQUIS 5 MG TABS tablet, TAKE 1 TABLET BY MOUTH TWICE A DAY., Disp: 60 tablet, Rfl: 12 .  fluticasone furoate-vilanterol (BREO ELLIPTA) 100-25 MCG/INH AEPB,  Inhale 1 puff into the lungs daily., Disp: 1 each, Rfl: 2 .  glucose blood (ACCU-CHEK AVIVA PLUS) test strip, Use to check blood sugar once a day, Disp: 100 each, Rfl: 4 .  isosorbide mononitrate (IMDUR) 60 MG 24 hr tablet, Take 1 tablet (60 mg total) by mouth daily. (Patient taking differently: Take 60 mg by mouth 2 (two) times daily. ), Disp: 60 tablet, Rfl: 1 .  lisinopril (PRINIVIL,ZESTRIL) 2.5 MG tablet, TAKE 1 TABLET BY MOUTH EVERY DAY, Disp: 30 tablet, Rfl: 5 .  Magnesium Oxide 400 (240 Mg) MG TABS, TAKE ONE TABLET BY MOUTH DAILY, Disp: 30 tablet, Rfl: 12 .  metFORMIN (GLUCOPHAGE) 500 MG tablet, TAKE 1 TABLET BY MOUTH EVERY MORNING, Disp: 90 tablet, Rfl: 4 .  nitroGLYCERIN (NITROSTAT) 0.4 MG SL tablet, Place 1 tablet (0.4 mg total) under the tongue every 5 (five) minutes as needed for chest pain. Reported on 05/26/2016, Disp: 30 tablet, Rfl: 1 .  omega-3 acid ethyl esters (LOVAZA) 1 g capsule, TAKE FOUR CAPSULES BY MOUTH DAILY, Disp: 120 capsule, Rfl: 12 .  omeprazole (PRILOSEC) 40 MG capsule, Take 1 capsule by mouth 2 (two) times daily., Disp: , Rfl:  .  oxyCODONE-acetaminophen (PERCOCET) 10-325 MG tablet, Take 1 tablet by mouth every 4 (four) hours as needed., Disp: 150 tablet, Rfl: 0 .  RANEXA 500 MG 12 hr tablet, Take 1,000 mg by mouth 2 (two) times daily., Disp: , Rfl:  .  sotalol (BETAPACE) 120 MG tablet, Take 120 mg by mouth 2 (two) times daily. , Disp: , Rfl:  .  SPIRIVA HANDIHALER 18 MCG inhalation capsule, INHALE ONE CAPSULE AS DIRECTED ONCE A DAY, Disp: 30 capsule, Rfl: 12 .  sucralfate (CARAFATE) 1 g tablet, Take by mouth. Take 1 tablet (1 g total) by mouth 3 (three) times a day Dissolve in 1 tbsp water to make slurry and sip 5 min before eat., Disp: , Rfl:  .  tamsulosin (FLOMAX) 0.4 MG CAPS capsule, Take 0.4 mg by mouth daily. , Disp: , Rfl:  .  torsemide (DEMADEX) 100 MG tablet, Take 0.5 tablets by mouth daily., Disp: , Rfl:   Review of Systems  Constitutional: Negative for  appetite change, chills and fever.  Respiratory: Positive for shortness of breath. Negative for chest tightness and wheezing.   Cardiovascular: Positive for chest pain and leg swelling. Negative for palpitations.  Gastrointestinal: Positive for abdominal distention and nausea. Negative for abdominal pain and vomiting.  Psychiatric/Behavioral: Positive for sleep disturbance (trouble falling asleep).    Social History   Tobacco Use  . Smoking status: Former Smoker    Last attempt to quit: 04/22/2013    Years since quitting: 5.0  . Smokeless tobacco: Never Used  Substance Use Topics  . Alcohol use: No    Comment: remotely quit alcohol use. Hx of heavy alcohol use.   Objective:   BP 122/70 (BP  Location: Left Arm, Patient Position: Sitting, Cuff Size: Large)   Pulse 61   Temp (!) 97.5 F (36.4 C) (Oral)   Resp 18   Wt 251 lb (113.9 kg)   SpO2 94% Comment: 2 L 02 nasal canual  BMI 39.31 kg/m  Vitals:   04/26/18 1550  BP: 122/70  Pulse: 61  Resp: 18  Temp: (!) 97.5 F (36.4 C)  TempSrc: Oral  SpO2: 94%  Weight: 251 lb (113.9 kg)     Physical Exam  General Appearance:    Alert, cooperative, no distress, obese  Eyes:    PERRL, conjunctiva/corneas clear, EOM's intact       Lungs:     Clear to auscultation bilaterally, respirations unlabored  Heart:    Regular rate and rhythm  Neurologic:   Awake, alert, oriented x 3. No apparent focal neurological           defect.       Results for orders placed or performed in visit on 04/26/18  POCT glycosylated hemoglobin (Hb A1C)  Result Value Ref Range   Hemoglobin A1C 7.5 (A) 4.0 - 5.6 %   HbA1c, POC (prediabetic range)  5.7 - 6.4 %   HbA1c, POC (controlled diabetic range)  0.0 - 7.0 %   Est. average glucose Bld gHb Est-mCnc 169        Assessment & Plan:     1. Chest pain, unspecified type Likely due to GERD as below. Continue regular cardiology follow up. He does not want to take SGLTI due to bad reaction to Jardiance. Will  consider Victoza.   2. Gastroesophageal reflux disease with esophagitis Continue PPI and sucralfate prescribed by Gi and follow up as scheduled.   3. OSA (obstructive sleep apnea) Strongly encourage consistent nightly use of CPAP machine.   4. Type 2 diabetes mellitus with other circulatory complication, without long-term current use of insulin (HCC) a1c is up. He thinks he can make significant improvement with diet. Discussed possible adding GLP1 agonist fi not improving at follow up.  - POCT glycosylated hemoglobin (Hb A1C)  5. Morbid obesity ([place]) Consider adding GLP1 agonist as above.   6. Nausea Prescription promethazine prn.        [name], MD  [facility]

## 2018-04-26 NOTE — Patient Instructions (Signed)
You need to start using CPAP every night to help your sleep, energy level, and reflux.

## 2018-04-27 ENCOUNTER — Encounter: Payer: Self-pay | Admitting: Family

## 2018-04-27 ENCOUNTER — Ambulatory Visit: Payer: Medicare HMO | Attending: Family | Admitting: Family

## 2018-04-27 VITALS — BP 151/85 | HR 58 | Resp 18 | Ht 67.0 in | Wt 250.4 lb

## 2018-04-27 DIAGNOSIS — E119 Type 2 diabetes mellitus without complications: Secondary | ICD-10-CM | POA: Insufficient documentation

## 2018-04-27 DIAGNOSIS — Z7901 Long term (current) use of anticoagulants: Secondary | ICD-10-CM | POA: Insufficient documentation

## 2018-04-27 DIAGNOSIS — I2510 Atherosclerotic heart disease of native coronary artery without angina pectoris: Secondary | ICD-10-CM | POA: Diagnosis not present

## 2018-04-27 DIAGNOSIS — G4733 Obstructive sleep apnea (adult) (pediatric): Secondary | ICD-10-CM

## 2018-04-27 DIAGNOSIS — K219 Gastro-esophageal reflux disease without esophagitis: Secondary | ICD-10-CM | POA: Diagnosis not present

## 2018-04-27 DIAGNOSIS — J449 Chronic obstructive pulmonary disease, unspecified: Secondary | ICD-10-CM | POA: Diagnosis not present

## 2018-04-27 DIAGNOSIS — I4891 Unspecified atrial fibrillation: Secondary | ICD-10-CM | POA: Diagnosis not present

## 2018-04-27 DIAGNOSIS — I110 Hypertensive heart disease with heart failure: Secondary | ICD-10-CM | POA: Insufficient documentation

## 2018-04-27 DIAGNOSIS — I5032 Chronic diastolic (congestive) heart failure: Secondary | ICD-10-CM | POA: Diagnosis not present

## 2018-04-27 DIAGNOSIS — J410 Simple chronic bronchitis: Secondary | ICD-10-CM

## 2018-04-27 DIAGNOSIS — I5033 Acute on chronic diastolic (congestive) heart failure: Secondary | ICD-10-CM

## 2018-04-27 DIAGNOSIS — Z79899 Other long term (current) drug therapy: Secondary | ICD-10-CM | POA: Insufficient documentation

## 2018-04-27 DIAGNOSIS — Z87891 Personal history of nicotine dependence: Secondary | ICD-10-CM | POA: Insufficient documentation

## 2018-04-27 DIAGNOSIS — E1159 Type 2 diabetes mellitus with other circulatory complications: Secondary | ICD-10-CM

## 2018-04-27 DIAGNOSIS — Z7984 Long term (current) use of oral hypoglycemic drugs: Secondary | ICD-10-CM | POA: Insufficient documentation

## 2018-04-27 DIAGNOSIS — I10 Essential (primary) hypertension: Secondary | ICD-10-CM

## 2018-04-27 MED ORDER — METOLAZONE 5 MG PO TABS: 5.0000 mg | ORAL_TABLET | ORAL | 0 refills | Status: DC

## 2018-04-27 MED ORDER — POTASSIUM CHLORIDE CRYS ER 20 MEQ PO TBCR: 20.0000 meq | EXTENDED_RELEASE_TABLET | ORAL | 0 refills | Status: DC

## 2018-04-27 NOTE — Patient Instructions (Addendum)
Continue weighing daily and call for an overnight weight gain of > 2 pounds or a weekly weight gain of >5 pounds.  Take metolazone (booster fluid pill) every other day for 3 doses. Take 1 potassium tablet every other day for 3 doses as well.  Best to take metolazone 1/2 hour prior to torsemide if able.

## 2018-04-27 NOTE — Progress Notes (Signed)
Patient ID: Lawrence [name], male    DOB: January 29, 1956, 62 y.o.   MRN: [id number]  HPI  Mr Hallenbeck is a 62 y/o male with a history of DM, PSVT, HTN, COPD (chronic oxygen), CAD with CABG, asthma, anxiety, anemia, atrial fibrillation, obstructive sleep apnea,  past tobacco use and chronic heart failure.   Echo report from 04/19/18 reviewed and showed an EF of 50-55%. Echo report from 11/10/17 reviewed and showed an EF of 50% along with mild AR/MR/TR. Echo done 03/27/16 and showed an EF of 60-65% along with trivial MR/TR. EF has improved from 45-50% November 2016. Most recent cardiac catheterization was done 11/02/16 and a DES was placed in the SVG to OM1 artery.   Admitted to hospital 04/18/18 after found to have chest pain at appointment with [place]. He was found to have angina with no acute changes in EKG. A Lexiscan stress test was also performed which was unremarkable. Cardiology was consulted during admission. Discharged the following day. Was in the ED 03/25/18 due to ruptured stye of his left upper eyelid. Treated and released. Was in the ED 03/21/18 due to right foot pain after a fall. Xray negative for fracture and he was released.   He presents today for a follow-up visit with a chief complaint of abdominal edema. Patient does not appear to be fluid overloaded in lower extremities. He also has chronic SOB associated with HF/COPD. He states that he has not had any chest pain since his hospital admission last week. Patient has associated fatigue, cough, sweating, shortness of breath, palpitations, abdominal distention, dizziness and fluctuating weight gain.   He was recently seen by GI Dr. Doristine Mango PA and was found to have ulcers as well as several polyps. Patient was started on sucralfate and omeprazole BID. He is undergoing work-up for infectious process- suspect H pylori but patient is unsure- and pancreatitis.   Past Medical History:  Diagnosis Date  . A-fib ([place])   . Anemia   . Anginal pain  ([place])   . Anxiety   . Arthritis   . Asthma   . CAD (coronary artery disease)    a. 2002 CABGx2 (LIMA->LAD, VG->VG->OM1);  b. 09/2012 DES->OM;  c. 03/2015 PTCA of LAD [facility]) in setting of atretic LIMA; d. 05/2015 Cath [facility]): nonobs dzs; e. 06/2015 Cath ([facility]): LM nl, LAD 45p/d ISR, 50d, D1/2 small, LCX 50p/d ISR, OM1 70ost, 30 ISR, VG->OM1 50ost, 45m LIMA->LAD 99p/d - atretic, RCA dom, nl; f.cath 10/16: 40-50%(FFR 0.90) pLAD, 75% (FFR 0.77) mLAD s/p PCI/DES, oRCA 40% (FFR0.95)  . Celiac disease   . Chronic diastolic CHF (congestive heart failure) ([place]    a. 06/2009 Echo: EF 60-65%, Gr 1 DD, triv AI, mildly dil LA, nl RV.  .Marland KitchenCOPD (chronic obstructive pulmonary disease) ([place]    a. Chronic bronchitis and emphysema.  . Diverticulosis   . Dysrhythmia   . Essential hypertension   . GERD (gastroesophageal reflux disease)   . History of tobacco abuse    a. Quit 2014.  .Marland KitchenPancreatitis   . PSVT (paroxysmal supraventricular tachycardia) ([place]    a. 10/2012 Noted on Zio Patch.  . Tubular adenoma of colon   . Type II diabetes mellitus ([place]    Past Surgical History:  Procedure Laterality Date  . BYPASS GRAFT    . CARDIAC CATHETERIZATION N/A 07/12/2015   rocedure: Left Heart Cath and Cors/Grafts Angiography;  Surgeon: HBelva Crome MD;  [place];  Service: Cardiovascular;  Laterality: N/A;  .  CARDIAC CATHETERIZATION Right 10/07/2015   Procedure: Left Heart Cath and Cors/Grafts Angiography;  Surgeon: Dionisio David, MD;  Location: [place] CV LAB;  Service: Cardiovascular;  Laterality: Right;  . CARDIAC CATHETERIZATION N/A 04/06/2016   Procedure: Left Heart Cath and Coronary Angiography;  Surgeon: Yolonda Kida, MD;  Location: [place] CV LAB;  Service: Cardiovascular;  Laterality: N/A;  . CARDIAC CATHETERIZATION  04/06/2016   Procedure: Bypass Graft Angiography;  Surgeon: Yolonda Kida, MD;  Location: [place] CV LAB;  Service: Cardiovascular;;  . CARDIAC  CATHETERIZATION N/A 11/02/2016   Procedure: Left Heart Cath and Cors/Grafts Angiography and possible PCI;  Surgeon: Yolonda Kida, MD;  Location: [place] CV LAB;  Service: Cardiovascular;  Laterality: N/A;  . CARDIAC CATHETERIZATION N/A 11/02/2016   Procedure: Coronary Stent Intervention;  Surgeon: Yolonda Kida, MD;  Location: [place] CV LAB;  Service: Cardiovascular;  Laterality: N/A;  . CHOLECYSTECTOMY    . COLONOSCOPY WITH PROPOFOL N/A 04/01/2018   Procedure: COLONOSCOPY WITH PROPOFOL;  Surgeon: Manya Silvas, MD;  Location: [facility] ENDOSCOPY;  Service: Endoscopy;  Laterality: N/A;  . ESOPHAGEAL DILATION    . ESOPHAGOGASTRODUODENOSCOPY (EGD) WITH PROPOFOL N/A 04/01/2018   Procedure: ESOPHAGOGASTRODUODENOSCOPY (EGD) WITH PROPOFOL;  Surgeon: Manya Silvas, MD;  Location: [facility] ENDOSCOPY;  Service: Endoscopy;  Laterality: N/A;  . TONSILLECTOMY    . VASCULAR SURGERY     Family History  Problem Relation Age of Onset  . Heart attack Mother   . Depression Mother   . Heart disease Mother   . COPD Mother   . Hypertension Mother   . Heart attack Father   . Diabetes Father   . Depression Father   . Heart disease Father   . Cirrhosis Father   . Parkinson's disease Brother    Social History   Tobacco Use  . Smoking status: Former Smoker    Last attempt to quit: 04/22/2013    Years since quitting: 5.0  . Smokeless tobacco: Never Used  Substance Use Topics  . Alcohol use: No    Comment: remotely quit alcohol use. Hx of heavy alcohol use.   Allergies  Allergen Reactions  . Demerol [Meperidine] Hives  . Jardiance [Empagliflozin]     Perineal pain  . Prednisone Other (See Comments) and Hypertension    Pt states that this medication puts him in A-fib   . Sulfa Antibiotics Hives  . Albuterol Sulfate [Albuterol] Palpitations and Other (See Comments)    Pt currently uses this medication.    . Morphine Sulfate Nausea And Vomiting, Rash and Other (See Comments)    Pt  states that he is only allergic to the tablet form of this medication.     Prior to Admission medications   Medication Sig Start Date End Date Taking? Authorizing Provider  albuterol (PROVENTIL HFA;VENTOLIN HFA) 108 (90 Base) MCG/ACT inhaler Inhale 2 puffs into the lungs every 4 (four) hours as needed for wheezing or shortness of breath. Reported on 04/08/2016 10/21/17  Yes Birdie Sons, MD  albuterol (PROVENTIL) (2.5 MG/3ML) 0.083% nebulizer solution Take 3 mLs (2.5 mg total) by nebulization every 6 (six) hours as needed for wheezing or shortness of breath. 01/04/18  Yes Birdie Sons, MD  allopurinol (ZYLOPRIM) 100 MG tablet TAKE 3 TABLETS BY MOUTH EVERY DAY. 03/11/18  Yes Birdie Sons, MD  ALPRAZolam Duanne Moron) 1 MG tablet TAKE ONE-HALF TO ONE TABLET BY MOUTH THREE TIMES A DAY AS NEEDED 03/24/18  Yes [name]  E, MD  aspirin 81 MG chewable tablet Chew 81 mg by mouth daily.   Yes [provider]  atorvastatin (LIPITOR) 80 MG tablet TAKE ONE TABLET BY MOUTH EVERY NIGHT AT BEDTIME 08/13/17  Yes Birdie Sons, MD  Blood Glucose Monitoring Suppl (ACCU-CHEK AVIVA PLUS) w/Device KIT CHECK BLOOD SUGAR EVERY DAY 12/08/17  Yes Birdie Sons, MD  cetirizine (ZYRTEC) 10 MG tablet TAKE ONE TABLET BY MOUTH AT BEDTIME. Patient taking differently: 1-2 tid prn 05/12/17  Yes Birdie Sons, MD  cyclobenzaprine (FLEXERIL) 5 MG tablet TAKE 1 TO 2 TABLETS BY MOUTH THREE TIMESDAILY AS NEEDED (BACK PAIN) 11/15/17  Yes Birdie Sons, MD  docusate sodium (COLACE) 100 MG capsule Take 100 mg by mouth daily.    Yes [provider]  ELIQUIS 5 MG TABS tablet TAKE 1 TABLET BY MOUTH TWICE A DAY. 07/28/17  Yes Birdie Sons, MD  EMPAGLIFLOZIN PO Take 10 mg by mouth daily.   Yes [provider]  fluticasone furoate-vilanterol (BREO ELLIPTA) 100-25 MCG/INH AEPB Inhale 1 puff into the lungs daily. 01/18/18  Yes Devona Konig A, MD  glucose blood (ACCU-CHEK AVIVA PLUS) test strip Use to  check blood sugar once a day 02/14/18  Yes Birdie Sons, MD  isosorbide mononitrate (IMDUR) 60 MG 24 hr tablet Take 1 tablet (60 mg total) by mouth daily. Patient taking differently: Take 60 mg by mouth 2 (two) times daily.  11/04/16  Yes Henreitta Leber, MD  lisinopril (PRINIVIL,ZESTRIL) 2.5 MG tablet TAKE 1 TABLET BY MOUTH EVERY DAY 03/11/18  Yes Birdie Sons, MD  Magnesium Oxide 400 (240 Mg) MG TABS TAKE ONE TABLET BY MOUTH DAILY 03/23/17  Yes Birdie Sons, MD  metFORMIN (GLUCOPHAGE) 500 MG tablet TAKE 1 TABLET BY MOUTH EVERY MORNING 11/30/17  Yes Birdie Sons, MD  nitroGLYCERIN (NITROSTAT) 0.4 MG SL tablet Place 1 tablet (0.4 mg total) under the tongue every 5 (five) minutes as needed for chest pain. Reported on 05/26/2016 10/06/16  Yes Birdie Sons, MD  omega-3 acid ethyl esters (LOVAZA) 1 g capsule TAKE FOUR CAPSULES BY MOUTH DAILY 06/04/17  Yes Birdie Sons, MD  oxyCODONE-acetaminophen (PERCOCET) 10-325 MG tablet Take 1 tablet by mouth every 4 (four) hours as needed. 03/22/18  Yes Birdie Sons, MD  pantoprazole (PROTONIX) 20 MG tablet Take 20 mg by mouth daily.   Yes [provider]  RANEXA 500 MG 12 hr tablet Take 1,000 mg by mouth 2 (two) times daily.   Yes [provider]  SPIRIVA HANDIHALER 18 MCG inhalation capsule INHALE ONE CAPSULE AS DIRECTED ONCE A DAY 02/03/18  Yes Birdie Sons, MD  tamsulosin (FLOMAX) 0.4 MG CAPS capsule Take by mouth.   Yes [provider]  torsemide (DEMADEX) 100 MG tablet Take 0.5 tablets by mouth daily. 10/11/17  Yes [provider]  potassium chloride SA (K-DUR,KLOR-CON) 20 MEQ tablet  08/13/17   [provider]  sotalol (BETAPACE) 120 MG tablet Take 120 mg by mouth 2 (two) times daily.     [provider]    Review of Systems  Constitutional: Positive for fatigue. Negative for appetite change.  HENT: Negative for congestion.   Eyes: Negative.   Respiratory: Positive for cough  (productive cough with thick yellow sputum), shortness of breath and wheezing. Negative for chest tightness.   Cardiovascular: Positive for palpitations. Negative for chest pain and leg swelling.  Gastrointestinal: Positive for abdominal distention, abdominal pain and nausea.  Endocrine: Negative.   Genitourinary: Negative.   Musculoskeletal: Positive for back pain (chronic).  Skin: Negative.   Allergic/Immunologic: Negative.   Neurological: Positive for light-headedness. Negative for dizziness and weakness.       Sweating  Hematological: Negative for adenopathy. Bruises/bleeds easily.  Psychiatric/Behavioral: Positive for sleep disturbance (wearing oxygen at 2L; not wearing CPAP due to space issues). Negative for dysphoric mood and suicidal ideas. The patient is not nervous/anxious.    Vitals:   04/27/18 0955  BP: (!) 151/85  Pulse: (!) 58  Resp: 18  SpO2: 97%  Weight: 250 lb 6 oz (113.6 kg)  Height: _0  (1.702 m)   Wt Readings from Last 3 Encounters:  04/27/18 250 lb 6 oz (113.6 kg)  04/26/18 251 lb (113.9 kg)  04/19/18 248 lb 3.8 oz (112.6 kg)   Lab Results  Component Value Date   CREATININE 1.18 04/19/2018   CREATININE 1.26 (H) 04/18/2018   CREATININE 1.37 (H) 12/12/2017   Physical Exam  Constitutional: He is oriented to person, place, and time. He appears well-developed and well-nourished.  HENT:  Head: Normocephalic and atraumatic.  Neck: Normal range of motion. Neck supple. No JVD present.  Cardiovascular: Regular rhythm. Bradycardia present.  Pulmonary/Chest: Effort normal. He has wheezes in the right upper field, the right lower field, the left upper field and the left lower field. He has no rales.  Abdominal: Soft. He exhibits distension. There is tenderness.  Musculoskeletal: He exhibits no edema or tenderness.  Neurological: He is alert and oriented to person, place, and time.  Skin: Skin is warm and dry.  Psychiatric: He has a normal mood and affect. His  behavior is normal. Thought content normal.  Nursing note and vitals reviewed.  Assessment & Plan:  1: Acute on Chronic heart failure with preserved ejection fraction- - NYHA class III - abdomen is severely fluid overloaded today - weighing daily and says that his weight is fluctuating. Instructed to call for an overnight weight gain of >2 pounds or a weekly weight gain of >5 pounds.  - weight up 2 pounds since discharge on 04/19/18 - not adding salt and is currently rinsing canned foods. Reviewed the importance of closely following a 205m sodium diet - drinking ~ 64 ounces of liquids daily - saw cardiologist ([name] 03/03/18 - BNP 09/02/16 was 34.0 - will do short course of metolazone 518mevery other day for 3 doses. Instructed patient to take K 2080mon days that he is taking metolazone - will get BMP at next appointment   2: HTN- - BP elevated today - saw PCP (FiCaryn Section/11/19 - BMP 04/19/18 reviewed and showed sodium 137, potassium 3.9 and GFR > 60/ Scr 1.18  3: COPD- - recently had pulmonary function test - continues to wear oxygen at 2L around the clock - saw pulmonologist (KhHumphrey Rolls/29/19  4: Diabetes-  - fasting glucose at home yesterday was 149 - last A1c 6.8% from 01/10/18 - followed by PCP Dr. FisCaryn Sectionast saw 04/26/18 where stated that may have to add another diabetes medication at follow up visit in 06/2018 - stressed importance of not missing metformin doses and sticking to low carb diet. Patient's exercise is limited by COPD, wearing ATC oxygen, and back pain  5. Sleep apnea- - patient is not currently using his CPAP at night for his OSA because he needs a new cord. He called company [employer] who stated that cord was $70. Patient states that he cannot afford this. CMA will contact foundation  to see if we can help cover the cost of this cord.   Patient did not bring his medications nor a list. Each medication was verbally reviewed with the patient and he was encouraged to  bring the bottles to every visit to confirm accuracy of list.  Return 05/17/18 for follow-up appointment and BMP or sooner if problems arise.   Lendon Ka, PharmD Pharmacy Resident

## 2018-04-30 ENCOUNTER — Other Ambulatory Visit: Payer: Self-pay

## 2018-04-30 ENCOUNTER — Emergency Department
Admission: EM | Admit: 2018-04-30 | Discharge: 2018-04-30 | Disposition: A | Discharge status: Home / Self Care | Payer: Medicare HMO | Attending: Emergency Medicine | Admitting: Emergency Medicine

## 2018-04-30 ENCOUNTER — Encounter: Payer: Self-pay | Admitting: Emergency Medicine

## 2018-04-30 ENCOUNTER — Emergency Department: Payer: Medicare HMO

## 2018-04-30 DIAGNOSIS — I5032 Chronic diastolic (congestive) heart failure: Secondary | ICD-10-CM | POA: Diagnosis not present

## 2018-04-30 DIAGNOSIS — I130 Hypertensive heart and chronic kidney disease with heart failure and stage 1 through stage 4 chronic kidney disease, or unspecified chronic kidney disease: Secondary | ICD-10-CM | POA: Insufficient documentation

## 2018-04-30 DIAGNOSIS — I2510 Atherosclerotic heart disease of native coronary artery without angina pectoris: Secondary | ICD-10-CM | POA: Insufficient documentation

## 2018-04-30 DIAGNOSIS — R109 Unspecified abdominal pain: Secondary | ICD-10-CM | POA: Diagnosis present

## 2018-04-30 DIAGNOSIS — J449 Chronic obstructive pulmonary disease, unspecified: Secondary | ICD-10-CM | POA: Insufficient documentation

## 2018-04-30 DIAGNOSIS — Z9981 Dependence on supplemental oxygen: Secondary | ICD-10-CM | POA: Diagnosis not present

## 2018-04-30 DIAGNOSIS — E860 Dehydration: Secondary | ICD-10-CM | POA: Diagnosis not present

## 2018-04-30 DIAGNOSIS — N189 Chronic kidney disease, unspecified: Secondary | ICD-10-CM | POA: Insufficient documentation

## 2018-04-30 DIAGNOSIS — J45909 Unspecified asthma, uncomplicated: Secondary | ICD-10-CM | POA: Insufficient documentation

## 2018-04-30 DIAGNOSIS — Z87891 Personal history of nicotine dependence: Secondary | ICD-10-CM | POA: Diagnosis not present

## 2018-04-30 DIAGNOSIS — R1010 Upper abdominal pain, unspecified: Secondary | ICD-10-CM | POA: Insufficient documentation

## 2018-04-30 DIAGNOSIS — M549 Dorsalgia, unspecified: Secondary | ICD-10-CM | POA: Diagnosis not present

## 2018-04-30 DIAGNOSIS — R0602 Shortness of breath: Secondary | ICD-10-CM | POA: Diagnosis not present

## 2018-04-30 DIAGNOSIS — E1122 Type 2 diabetes mellitus with diabetic chronic kidney disease: Secondary | ICD-10-CM | POA: Diagnosis not present

## 2018-04-30 LAB — COMPREHENSIVE METABOLIC PANEL
ALT: 25 U/L (ref 17–63)
AST: 22 U/L (ref 15–41)
Albumin: 4.2 g/dL (ref 3.5–5.0)
Alkaline Phosphatase: 77 U/L (ref 38–126)
Anion gap: 14 (ref 5–15)
BUN: 29 mg/dL — ABNORMAL HIGH (ref 6–20)
CO2: 32 mmol/L (ref 22–32)
Calcium: 10 mg/dL (ref 8.9–10.3)
Chloride: 91 mmol/L — ABNORMAL LOW (ref 101–111)
Creatinine, Ser: 1.5 mg/dL — ABNORMAL HIGH (ref 0.61–1.24)
GFR calc Af Amer: 55 mL/min — ABNORMAL LOW (ref >60)
GFR calc non Af Amer: 47 mL/min — ABNORMAL LOW (ref >60)
Glucose, Bld: 167 mg/dL — ABNORMAL HIGH (ref 65–99)
Potassium: 3.7 mmol/L (ref 3.5–5.1)
Sodium: 137 mmol/L (ref 135–145)
Total Bilirubin: 1.1 mg/dL (ref 0.3–1.2)
Total Protein: 7.6 g/dL (ref 6.5–8.1)

## 2018-04-30 LAB — CBC
HCT: 52.7 % — ABNORMAL HIGH (ref 40.0–52.0)
Hemoglobin: 17.6 g/dL (ref 13.0–18.0)
MCH: 30.3 pg (ref 26.0–34.0)
MCHC: 33.3 g/dL (ref 32.0–36.0)
MCV: 91.1 fL (ref 80.0–100.0)
Platelets: 219 10*3/uL (ref 150–440)
RBC: 5.79 MIL/uL (ref 4.40–5.90)
RDW: 15.3 % — ABNORMAL HIGH (ref 11.5–14.5)
WBC: 10.3 10*3/uL (ref 3.8–10.6)

## 2018-04-30 LAB — BRAIN NATRIURETIC PEPTIDE: B Natriuretic Peptide: 28 pg/mL (ref 0.0–100.0)

## 2018-04-30 LAB — LIPASE, BLOOD: Lipase: 32 U/L (ref 11–51)

## 2018-04-30 LAB — TROPONIN I: Troponin I: <0.03 ng/mL (ref <0.03)

## 2018-04-30 MED ORDER — MORPHINE SULFATE (PF) 4 MG/ML IV SOLN
4.0000 mg | Freq: Once | INTRAVENOUS | Status: AC
  Administered 2018-04-30: 4 mg via INTRAVENOUS
  Filled 2018-04-30: qty 1

## 2018-04-30 MED ORDER — SODIUM CHLORIDE 0.9 % IV BOLUS
500.0000 mL | Freq: Once | INTRAVENOUS | Status: AC
  Administered 2018-04-30: 500 mL via INTRAVENOUS

## 2018-04-30 MED ORDER — ONDANSETRON 4 MG PO TBDP: 4.0000 mg | ORAL_TABLET | Freq: Three times a day (TID) | ORAL | 1 refills | Status: DC | PRN

## 2018-04-30 NOTE — ED Provider Notes (Signed)
[facility] Emergency Department Provider Note       Time seen: ----------------------------------------- 4:57 PM on 04/30/2018 -----------------------------------------   I have reviewed the triage vital signs and the nursing notes.  HISTORY   Chief Complaint Abdominal Pain and Shortness of Breath    HPI Lawrence [name] is a 62 y.o. male with a history of chronic atrial fibrillation on anticoagulation, anemia, chronic angina, coronary artery disease, celiac disease, chronic diastolic congestive heart failure who presents to the ED for abdominal pain and fluid retention for the past 2 weeks.  Patient reports some shortness of breath with exertion.  He has 2 L of oxygen at home which is his normal rate.  He describes a history of chronic pancreatitis and does have chronic pain medicine at home but this is for his back.  He states he is short of breath but he has chronic COPD.  Recently he has had a dose of Zaroxolyn after being seen at the heart failure clinic for peripheral edema.  Past Medical History:  Diagnosis Date  . A-fib ([place])   . Anemia   . Anginal pain ([place])   . Anxiety   . Arthritis   . Asthma   . CAD (coronary artery disease)    a. 2002 CABGx2 (LIMA->LAD, VG->VG->OM1);  b. 09/2012 DES->OM;  c. 03/2015 PTCA of LAD [facility]) in setting of atretic LIMA; d. 05/2015 Cath [facility]): nonobs dzs; e. 06/2015 Cath ([facility]): LM nl, LAD 45p/d ISR, 50d, D1/2 small, LCX 50p/d ISR, OM1 70ost, 30 ISR, VG->OM1 50ost, 24m LIMA->LAD 99p/d - atretic, RCA dom, nl; f.cath 10/16: 40-50%(FFR 0.90) pLAD, 75% (FFR 0.77) mLAD s/p PCI/DES, oRCA 40% (FFR0.95)  . Celiac disease   . Chronic diastolic CHF (congestive heart failure) ([place]    a. 06/2009 Echo: EF 60-65%, Gr 1 DD, triv AI, mildly dil LA, nl RV.  .Marland KitchenCOPD (chronic obstructive pulmonary disease) ([place]    a. Chronic bronchitis and emphysema.  . Diverticulosis   . Dysrhythmia   . Essential hypertension   . GERD (gastroesophageal  reflux disease)   . History of tobacco abuse    a. Quit 2014.  .Marland KitchenPancreatitis   . PSVT (paroxysmal supraventricular tachycardia) ([place]    a. 10/2012 Noted on Zio Patch.  . Tubular adenoma of colon   . Type II diabetes mellitus ([facility]     Patient Active Problem List   Diagnosis Date Noted  . Acute on chronic heart failure (HE[place] 04/18/2018  . History of adenomatous polyp of colon 04/05/2018  . Abdominal pain, chronic, epigastric 11/06/2017  . Bilateral flank pain 03/24/2017  . Dyspnea 04/03/2016  . Hypotension 04/03/2016  . Chronic kidney disease 04/03/2016  . Anemia 04/03/2016  . Paroxysmal atrial fibrillation ([place] 12/23/2015  . OSA (obstructive sleep apnea) 12/10/2015  . Left inguinal hernia 11/07/2015  . Anxiety 11/07/2015  . Unstable angina ([place] 10/05/2015  . Back pain with left-sided radiculopathy 09/30/2015  . Nocturnal hypoxia 09/06/2015  . BPH (benign prostatic hyperplasia) 08/01/2015  . Chronic diastolic CHF (congestive heart failure) ([place]   . Angina pectoris ([place]lam[place]   . Chest pain 07/11/2015  . COPD (chronic obstructive pulmonary disease) ([place] 07/03/2015  . CAD (coronary artery disease) 06/26/2015  . HTN (hypertension) 06/26/2015  . DM (diabetes mellitus) ([place] 06/26/2015  . Achalasia 07/24/2014  . GERD (gastroesophageal reflux disease) 06/07/2014  . Former tobacco use 04/11/2013  . HLD (hyperlipidemia) 04/09/2013    Past Surgical History:  Procedure Laterality Date  . BYPASS GRAFT    .  CARDIAC CATHETERIZATION N/A 07/12/2015   rocedure: Left Heart Cath and Cors/Grafts Angiography;  Surgeon: Belva Crome, MD;  Location: [place] CV LAB;  Service: Cardiovascular;  Laterality: N/A;  . CARDIAC CATHETERIZATION Right 10/07/2015   Procedure: Left Heart Cath and Cors/Grafts Angiography;  Surgeon: Dionisio David, MD;  Location: [place] CV LAB;  Service: Cardiovascular;  Laterality: Right;  . CARDIAC CATHETERIZATION N/A 04/06/2016   Procedure: Left Heart Cath  and Coronary Angiography;  Surgeon: Yolonda Kida, MD;  Location: [place] CV LAB;  Service: Cardiovascular;  Laterality: N/A;  . CARDIAC CATHETERIZATION  04/06/2016   Procedure: Bypass Graft Angiography;  Surgeon: Yolonda Kida, MD;  Location: [place] CV LAB;  Service: Cardiovascular;;  . CARDIAC CATHETERIZATION N/A 11/02/2016   Procedure: Left Heart Cath and Cors/Grafts Angiography and possible PCI;  Surgeon: Yolonda Kida, MD;  Location: [place] CV LAB;  Service: Cardiovascular;  Laterality: N/A;  . CARDIAC CATHETERIZATION N/A 11/02/2016   Procedure: Coronary Stent Intervention;  Surgeon: Yolonda Kida, MD;  Location: [place] CV LAB;  Service: Cardiovascular;  Laterality: N/A;  . CHOLECYSTECTOMY    . COLONOSCOPY WITH PROPOFOL N/A 04/01/2018   Procedure: COLONOSCOPY WITH PROPOFOL;  Surgeon: Manya Silvas, MD;  Location[facility] ENDOSCOPY;  Service: Endoscopy;  Laterality: N/A;  . ESOPHAGEAL DILATION    . ESOPHAGOGASTRODUODENOSCOPY (EGD) WITH PROPOFOL N/A 04/01/2018   Procedure: ESOPHAGOGASTRODUODENOSCOPY (EGD) WITH PROPOFOL;  Surgeon: Manya Silvas, MD;  Location: [facility] ENDOSCOPY;  Service: Endoscopy;  Laterality: N/A;  . TONSILLECTOMY    . VASCULAR SURGERY      Allergies Demerol [meperidine]; Jardiance [empagliflozin]; Prednisone; Sulfa antibiotics; Albuterol sulfate [albuterol]; and Morphine sulfate  Social History Social History   Tobacco Use  . Smoking status: Former Smoker    Last attempt to quit: 04/22/2013    Years since quitting: 5.0  . Smokeless tobacco: Never Used  Substance Use Topics  . Alcohol use: No    Comment: remotely quit alcohol use. Hx of heavy alcohol use.  . Drug use: No   Review of Systems Constitutional: Negative for fever. Cardiovascular: Negative for chest pain. Respiratory: Positive for shortness of breath Gastrointestinal: Positive for abdominal pain Musculoskeletal: Positive for chronic back pain Skin:  Negative for rash. Neurological: Negative for headaches, focal weakness or numbness.  All systems negative/normal/unremarkable except as stated in the HPI  ____________________________________________   PHYSICAL EXAM:  VITAL SIGNS: ED Triage Vitals [04/30/18 1528]  Enc Vitals Group     BP 120/85     Pulse Rate 72     Resp 20     Temp 98.9 F (37.2 C)     Temp Source Oral     SpO2 96 %     Weight 250 lb (113.4 kg)     Height 5' 7"  (1.702 m)     Head Circumference      Peak Flow      Pain Score 8     Pain Loc      Pain Edu?      Excl. in [place]?    Constitutional: Alert and oriented. Well appearing and in no distress. Eyes: Conjunctivae are normal. Normal extraocular movements. ENT   Head: Normocephalic and atraumatic.   Nose: No congestion/rhinnorhea.   Mouth/Throat: Mucous membranes are moist.   Neck: No stridor. Cardiovascular: Normal rate, regular rhythm. No murmurs, rubs, or gallops. Respiratory: Normal respiratory effort without tachypnea nor retractions. Breath sounds are clear and equal bilaterally. No wheezes/rales/rhonchi. Gastrointestinal: Soft  and nontender. Normal bowel sounds Musculoskeletal: Nontender with normal range of motion in extremities. No lower extremity tenderness nor edema. Neurologic:  Normal speech and language. No gross focal neurologic deficits are appreciated.  Skin:  Skin is warm, dry and intact. No rash noted. Psychiatric: Mood and affect are normal. Speech and behavior are normal.  ____________________________________________  EKG: Interpreted by me.  Sinus rhythm rate 73 bpm, normal PR interval, normal QRS, normal QT  ____________________________________________  ED COURSE:  As part of my medical decision making, I reviewed the following data within the [place] History obtained from family if available, nursing notes, old chart and ekg, as well as notes from prior ED visits. Patient presented for  shortness of breath, abdominal pain, we will assess with labs and imaging as indicated at this time.   Procedures ____________________________________________   LABS (pertinent positives/negatives)  Labs Reviewed  CBC - Abnormal; Notable for the following components:      Result Value   HCT 52.7 (*)    RDW 15.3 (*)    All other components within normal limits  COMPREHENSIVE METABOLIC PANEL - Abnormal; Notable for the following components:   Chloride 91 (*)    Glucose, Bld 167 (*)    BUN 29 (*)    Creatinine, Ser 1.50 (*)    GFR calc non Af Amer 47 (*)    GFR calc Af Amer 55 (*)    All other components within normal limits  TROPONIN I  BRAIN NATRIURETIC PEPTIDE  LIPASE, BLOOD  ___________________________________________  DIFFERENTIAL DIAGNOSIS   Chronic pain, chronic diastolic heart failure, dehydration, electrolyte abnormality  FINAL ASSESSMENT AND PLAN  Chronic pain, dehydration   Plan: The patient had presented for chronic pain. Patient's labs did reflect some degree of dehydration, we did give a 500 cc normal saline bolus.  Patient does not require any imaging during his ER visit.  We did give him a dose of morphine for pain, I feel like his pain is likely chronic.  I have advised that he stop the Zaroxolyn.  He is stable for outpatient follow-up.   Lawrence Aly, MD   Note: This note was generated in part or whole with voice recognition software. Voice recognition is usually quite accurate but there are transcription errors that can and very often do occur. I apologize for any typographical errors that were not detected and corrected.     Earleen Newport, MD 04/30/18 [phone]

## 2018-04-30 NOTE — ED Triage Notes (Signed)
States abdominal pain and fluid retention x 2 weeks. SOB with exertion. Has 02 at 2L which is his normal rate. Denies fevers.

## 2018-04-30 NOTE — ED Notes (Signed)
Pt's wife called for pt, pt's son to come and pick up pt

## 2018-05-02 ENCOUNTER — Other Ambulatory Visit: Payer: Self-pay | Admitting: Family

## 2018-05-06 IMAGING — CT CT ABD-PELV W/ CM
2 of 5 series · 16 of 46 positions shown, 18 images · IV contrast (APPLIED)
Comparison: 04/22/2016 and prior studies

CLINICAL DATA: Pt presents to Ed via [REDACTED] with c/o left sided
pressure chest pain and RUQ/LUQ pain, associated with
nausea/vomiting for last 2 days.

EXAM:
CT ABDOMEN AND PELVIS WITH CONTRAST
TECHNIQUE: Multidetector CT imaging of the abdomen and pelvis was performed
using the standard protocol following bolus administration of
intravenous contrast.
CONTRAST:  100mL PSBW86-QOO IOPAMIDOL (PSBW86-QOO) INJECTION 61%

[Series 2: axial st · axial · 0.96mm/px · z∈[-422,-7]mm · 13 of 95 slices shown, 15 images]
[im 6/95  soft-tissue]
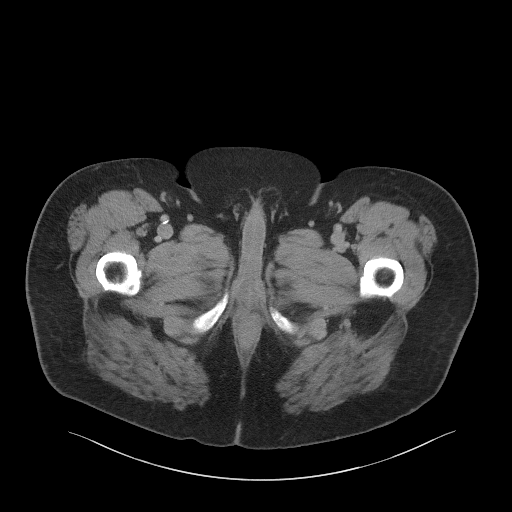
[im 6/95  bone]
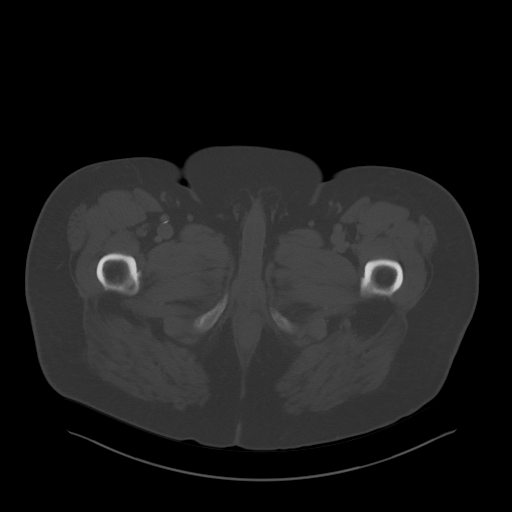
[im 11/95  soft-tissue]
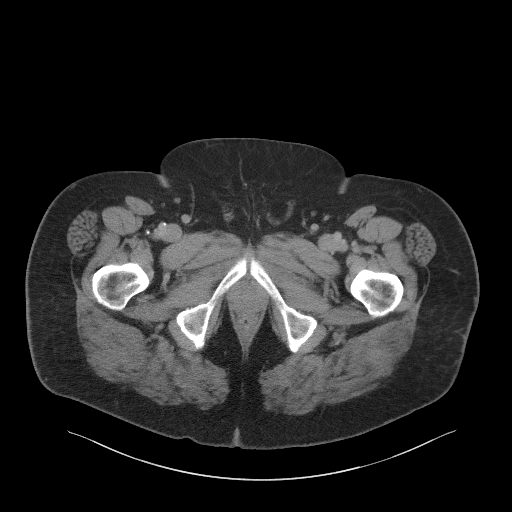
[im 21/95  soft-tissue]
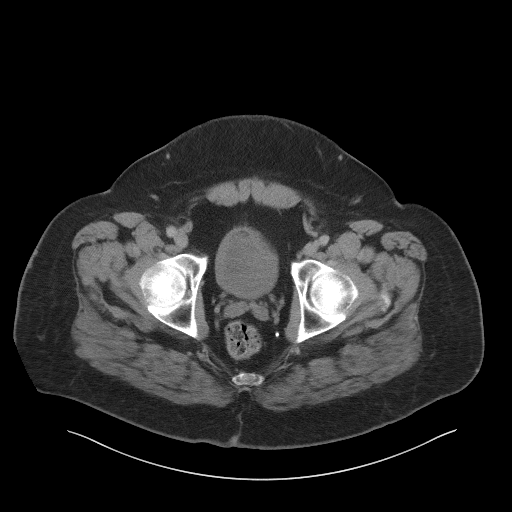
[im 27/95  soft-tissue]
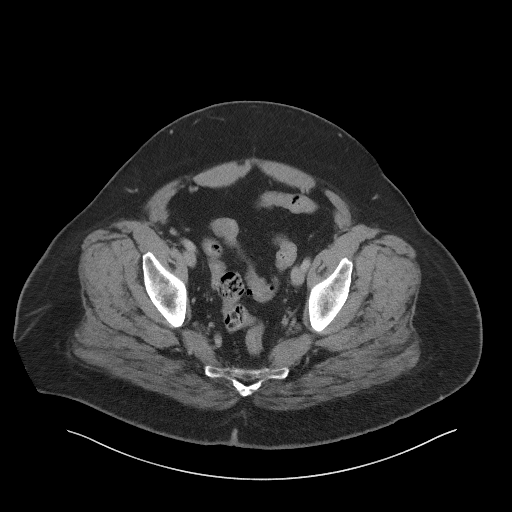
[im 32/95  soft-tissue]
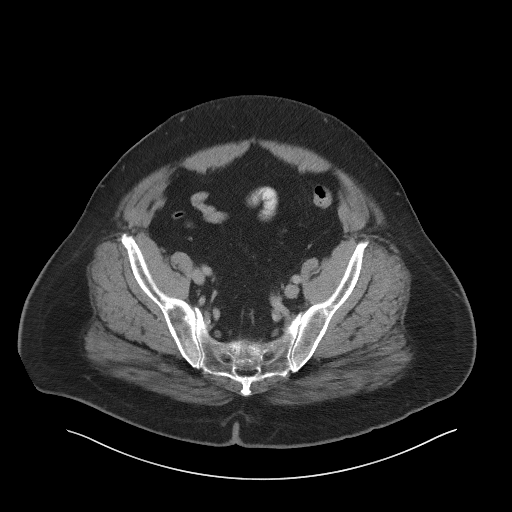
[im 42/95  soft-tissue]
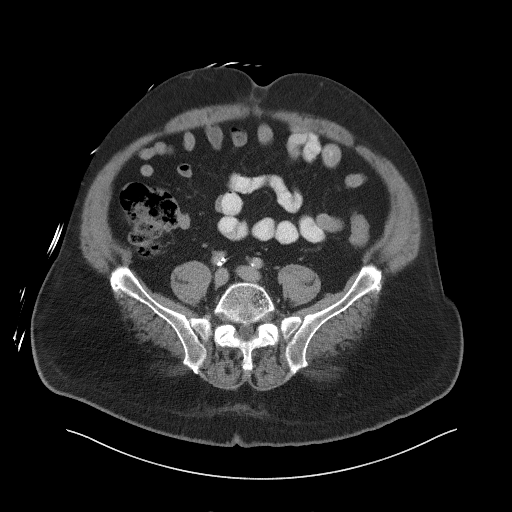
[im 48/95  soft-tissue]
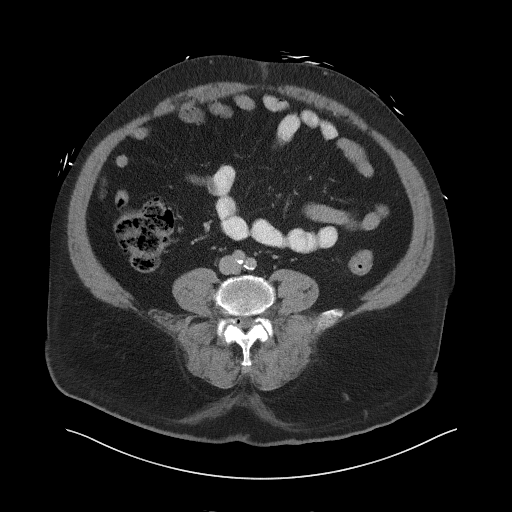
[im 53/95  soft-tissue]
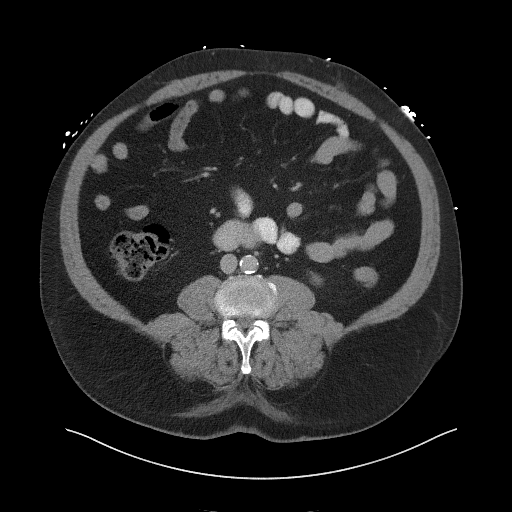
[im 63/95  soft-tissue]
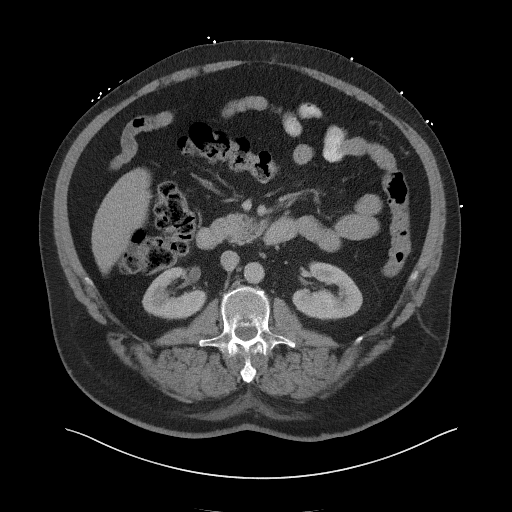
[im 63/95  bone]
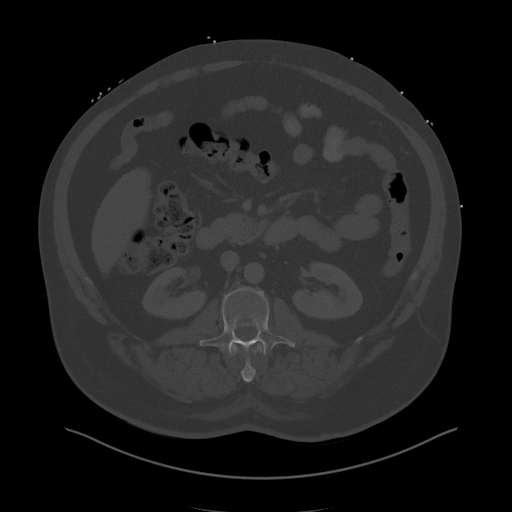
[im 68/95  soft-tissue]
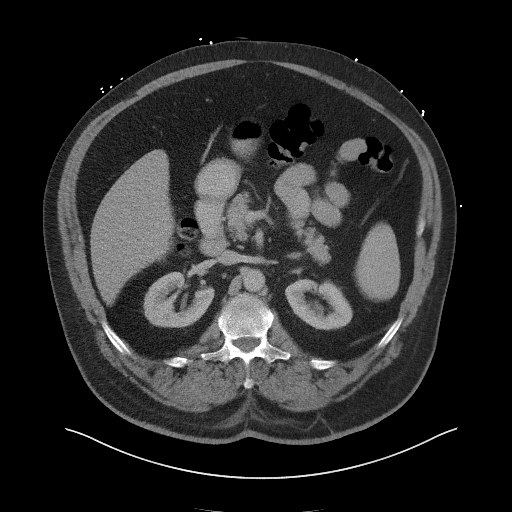
[im 74/95  soft-tissue]
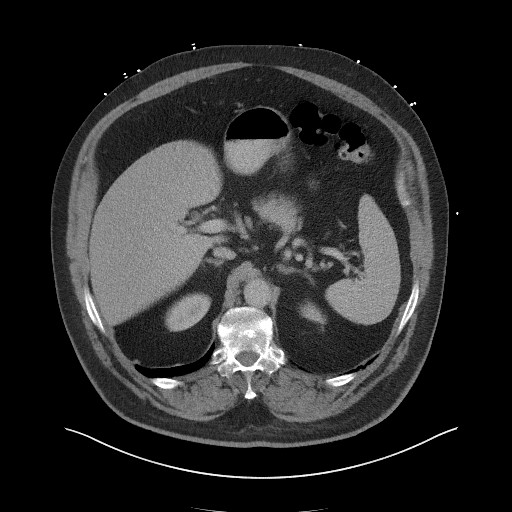
[im 84/95  soft-tissue]
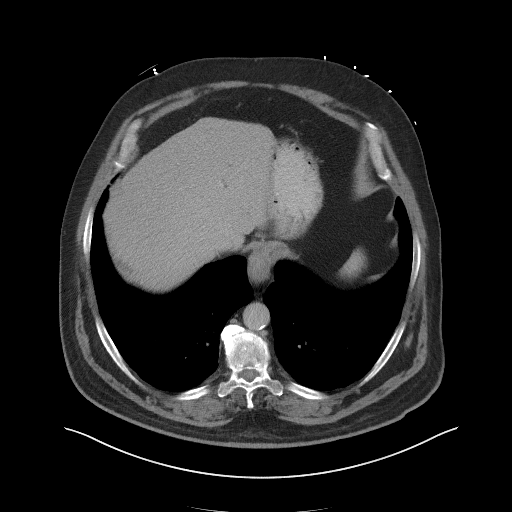
[im 89/95  soft-tissue]
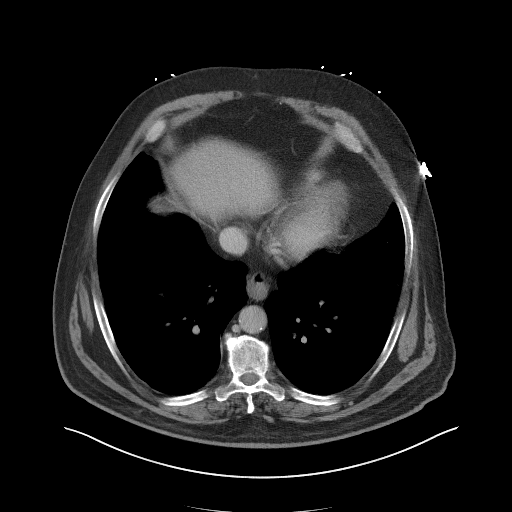

[Series 5: coronal st · coronal · 0.83mm/px · 3 of 119 slices shown]
[im 40/119  soft-tissue]
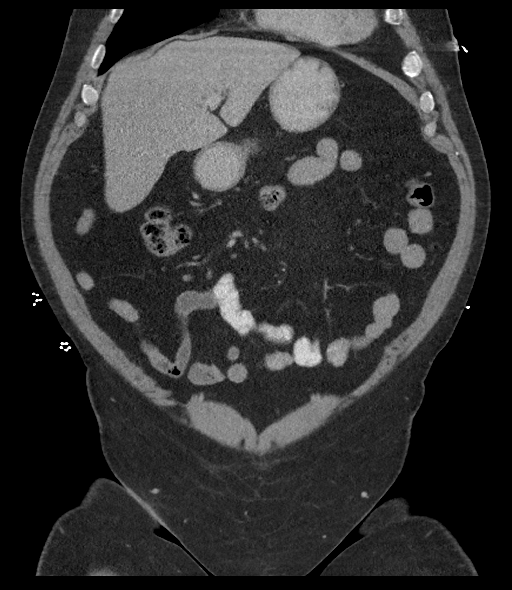
[im 53/119  soft-tissue]
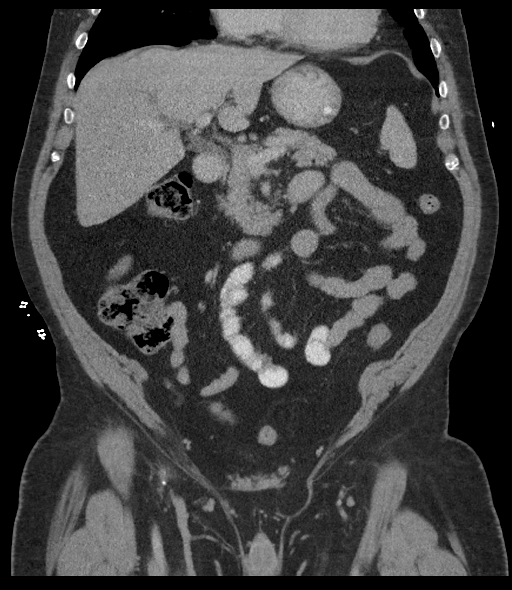
[im 66/119  soft-tissue]
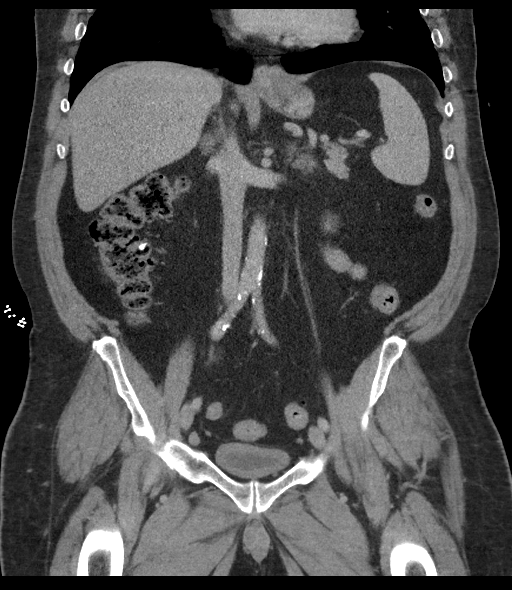

[16 of 46 positions shown; findings below may reference images not displayed]

FINDINGS: Lower chest:  No acute findings.

Hepatobiliary: No masses or other significant abnormality.
Cholecystectomy clips.

Pancreas: No mass, inflammatory changes, or other significant
abnormality.

Spleen: Within normal limits in size and appearance.

Adrenals/Urinary Tract: 12 mm right and 14 mm left adrenal nodules ,
present since scans dating back to 6306. No hydronephrosis. No
urolithiasis. Urinary bladder is nondistended, appearing diffusely
thick walled.

Stomach/Bowel: Small duodenal diverticulum. Stomach and small bowel
are nondilated. Normal appendix. Scattered sigmoid diverticula
without adjacent inflammatory/edematous change. Colon is nondilated.

Vascular/Lymphatic: Scattered aortoiliac arterial calcifications
without aneurysm or stenosis. Portal vein patent. No adenopathy
localized.

Reproductive: Mild prostatic enlargement.

Other: No ascites.  No free air.

Musculoskeletal: Mild spondylitic changes in the lower thoracic
spine.
IMPRESSION: 1. No acute abdominal process.
2. Chronic and ancillary findings as above

## 2018-05-06 IMAGING — CR DG CHEST 2V
3 series · 3 of 3 positions shown · non-contrast
Comparison: 04/03/2016

CLINICAL DATA: Pt presents to Ed via [REDACTED] with c/o left sided
pressure chest pain and URQ/ULQ pain, associated with
nausea/vomiting for last 2 days.

EXAM:
CHEST - 2 VIEW

[chest lat (1 of 2)]
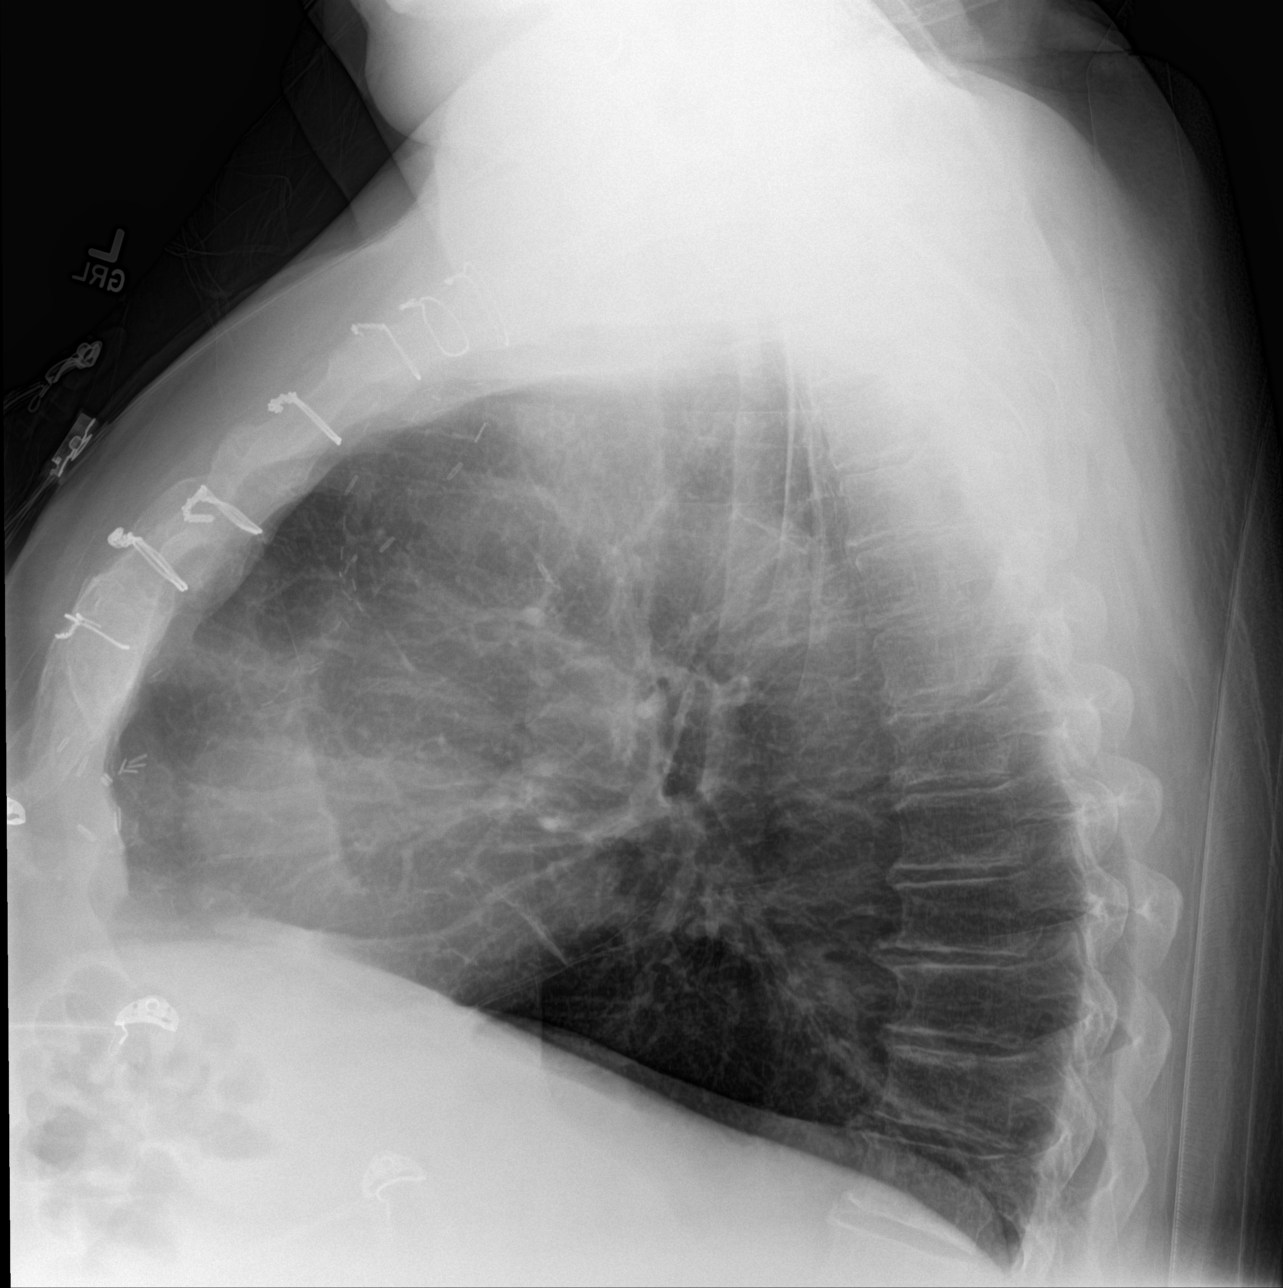

[chest ap]
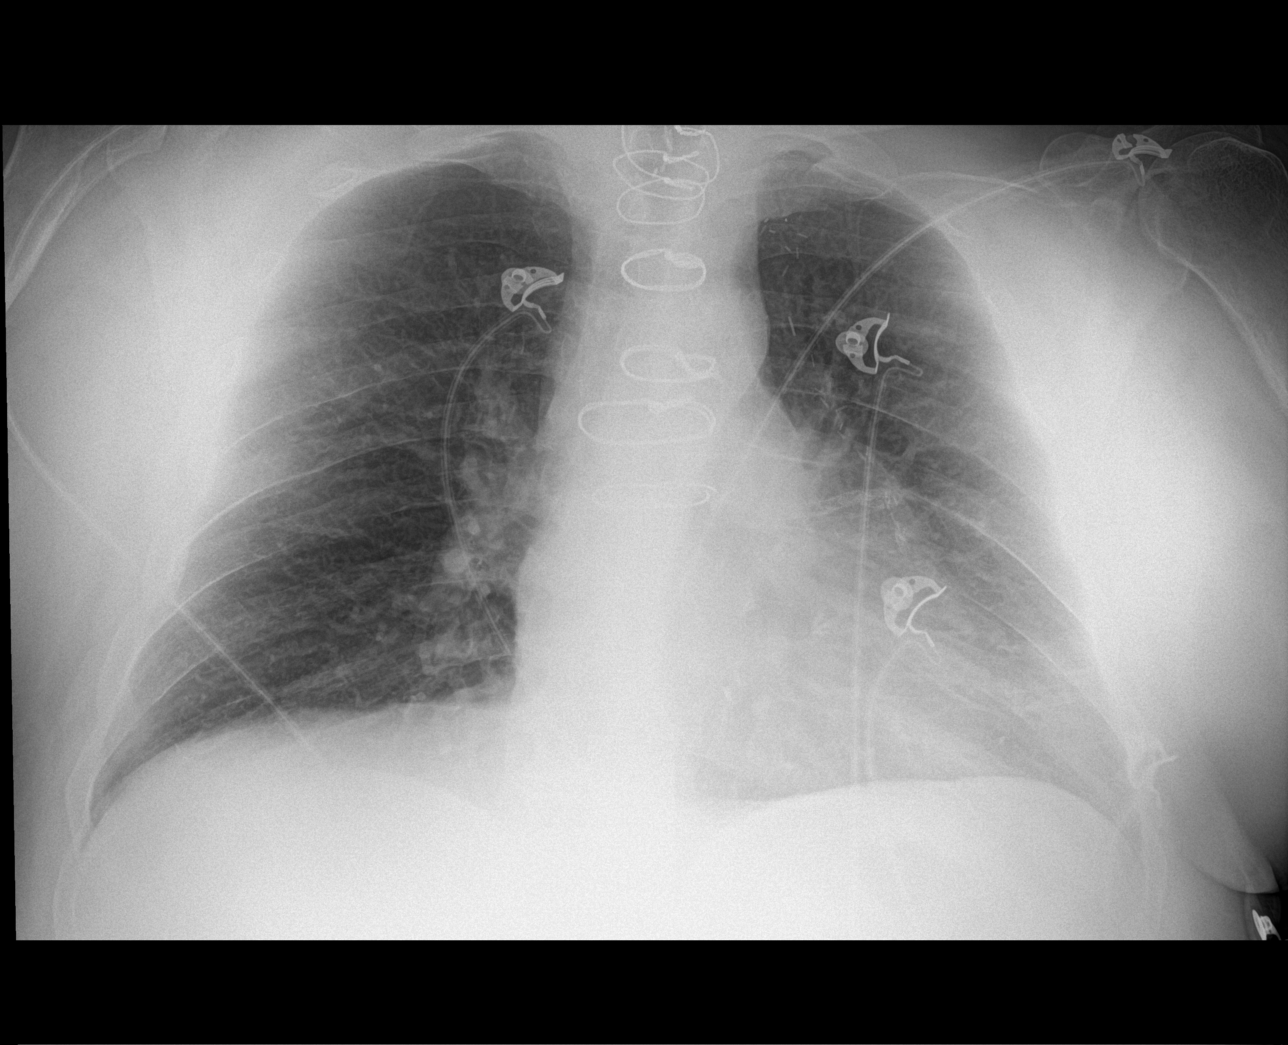

[chest lat (2 of 2)]
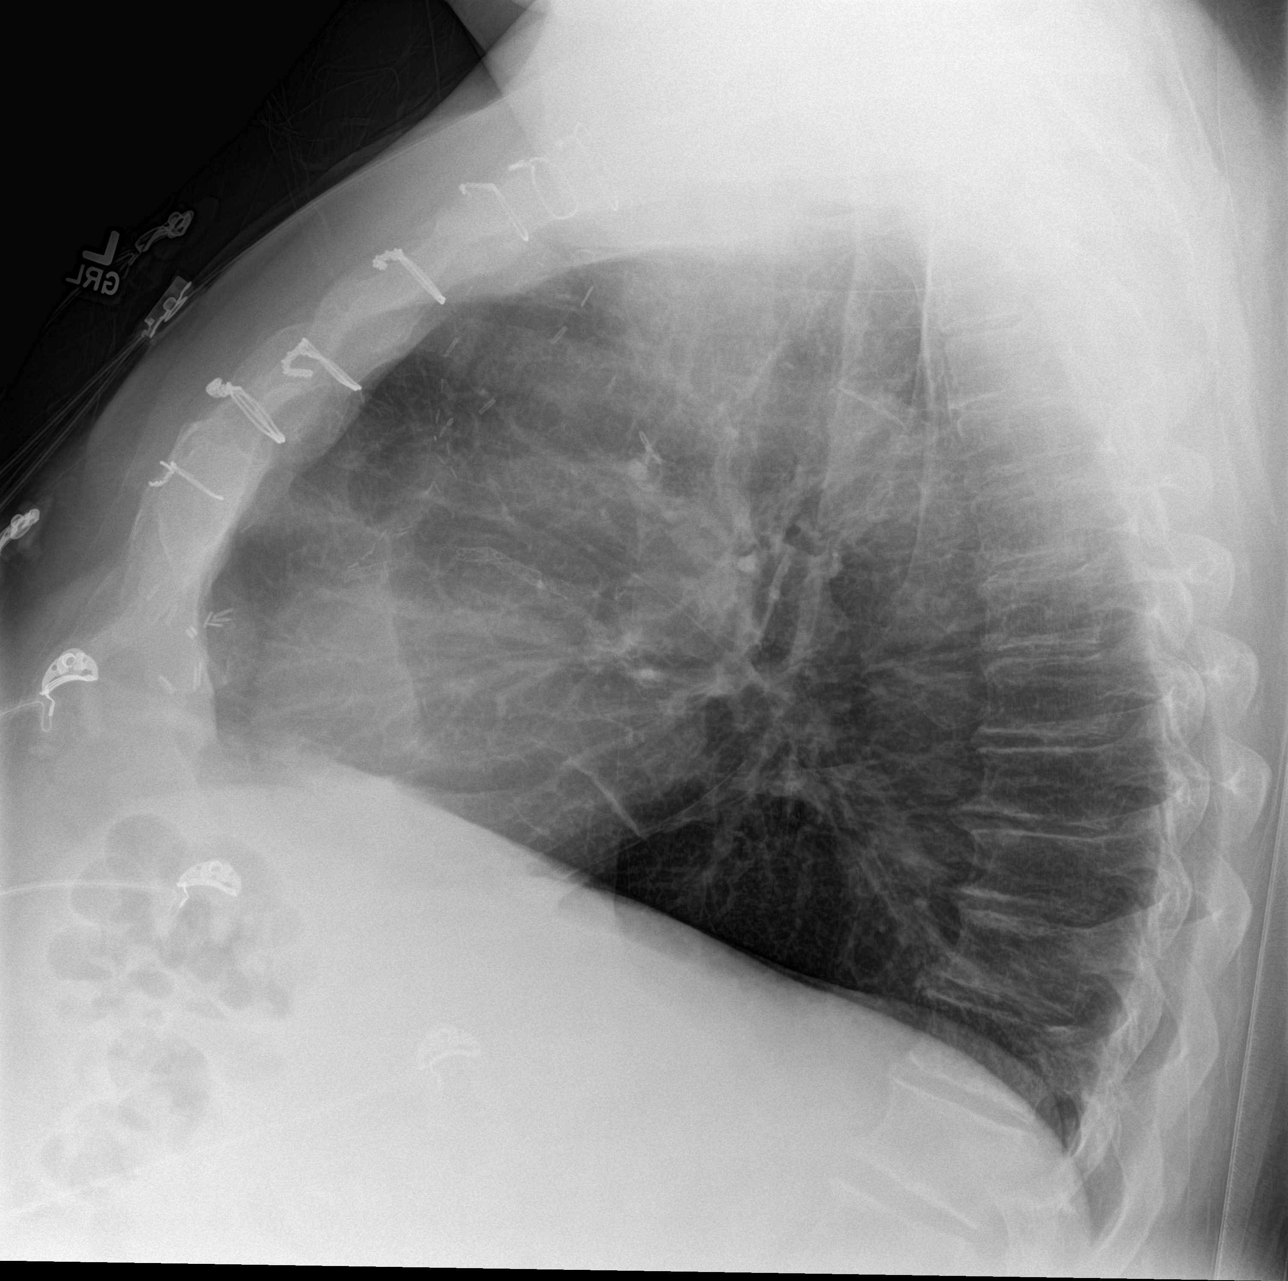

[3 of 3 positions shown; findings below may reference images not displayed]

FINDINGS: Previous median sternotomy and CABG. Heart size upper limits normal.

There is some linear scarring in the left mid and lower lung as
before. Right lung clear. No new infiltrate or overt edema.

No effusion or pneumothorax.

Anterior vertebral endplate spurring at multiple levels in the mid
and lower thoracic spine.
IMPRESSION: 1. No acute abnormality post CABG.

## 2018-05-12 DIAGNOSIS — I10 Essential (primary) hypertension: Secondary | ICD-10-CM | POA: Diagnosis not present

## 2018-05-12 DIAGNOSIS — J439 Emphysema, unspecified: Secondary | ICD-10-CM | POA: Diagnosis not present

## 2018-05-12 DIAGNOSIS — G4710 Hypersomnia, unspecified: Secondary | ICD-10-CM | POA: Diagnosis not present

## 2018-05-12 DIAGNOSIS — J449 Chronic obstructive pulmonary disease, unspecified: Secondary | ICD-10-CM | POA: Diagnosis not present

## 2018-05-13 ENCOUNTER — Other Ambulatory Visit: Payer: Self-pay

## 2018-05-13 MED ORDER — FLUTICASONE FUROATE-VILANTEROL 100-25 MCG/INH IN AEPB: 1.0000 | INHALATION_SPRAY | Freq: Every day | RESPIRATORY_TRACT | 2 refills | Status: DC

## 2018-05-17 ENCOUNTER — Encounter: Payer: Self-pay | Admitting: Family

## 2018-05-17 ENCOUNTER — Ambulatory Visit: Payer: Medicare HMO | Attending: Family | Admitting: Family

## 2018-05-17 VITALS — BP 113/62 | HR 60 | Resp 18 | Ht 67.0 in | Wt 254.4 lb

## 2018-05-17 DIAGNOSIS — I5032 Chronic diastolic (congestive) heart failure: Secondary | ICD-10-CM | POA: Insufficient documentation

## 2018-05-17 DIAGNOSIS — G4733 Obstructive sleep apnea (adult) (pediatric): Secondary | ICD-10-CM | POA: Diagnosis not present

## 2018-05-17 DIAGNOSIS — Z888 Allergy status to other drugs, medicaments and biological substances status: Secondary | ICD-10-CM | POA: Diagnosis not present

## 2018-05-17 DIAGNOSIS — Z7982 Long term (current) use of aspirin: Secondary | ICD-10-CM | POA: Insufficient documentation

## 2018-05-17 DIAGNOSIS — I110 Hypertensive heart disease with heart failure: Secondary | ICD-10-CM | POA: Diagnosis not present

## 2018-05-17 DIAGNOSIS — D649 Anemia, unspecified: Secondary | ICD-10-CM | POA: Insufficient documentation

## 2018-05-17 DIAGNOSIS — Z885 Allergy status to narcotic agent status: Secondary | ICD-10-CM | POA: Diagnosis not present

## 2018-05-17 DIAGNOSIS — Z818 Family history of other mental and behavioral disorders: Secondary | ICD-10-CM | POA: Insufficient documentation

## 2018-05-17 DIAGNOSIS — Z955 Presence of coronary angioplasty implant and graft: Secondary | ICD-10-CM | POA: Diagnosis not present

## 2018-05-17 DIAGNOSIS — Z9049 Acquired absence of other specified parts of digestive tract: Secondary | ICD-10-CM | POA: Diagnosis not present

## 2018-05-17 DIAGNOSIS — I2510 Atherosclerotic heart disease of native coronary artery without angina pectoris: Secondary | ICD-10-CM | POA: Insufficient documentation

## 2018-05-17 DIAGNOSIS — E1159 Type 2 diabetes mellitus with other circulatory complications: Secondary | ICD-10-CM

## 2018-05-17 DIAGNOSIS — Z951 Presence of aortocoronary bypass graft: Secondary | ICD-10-CM | POA: Insufficient documentation

## 2018-05-17 DIAGNOSIS — E119 Type 2 diabetes mellitus without complications: Secondary | ICD-10-CM | POA: Insufficient documentation

## 2018-05-17 DIAGNOSIS — Z882 Allergy status to sulfonamides status: Secondary | ICD-10-CM | POA: Insufficient documentation

## 2018-05-17 DIAGNOSIS — Z7984 Long term (current) use of oral hypoglycemic drugs: Secondary | ICD-10-CM | POA: Insufficient documentation

## 2018-05-17 DIAGNOSIS — Z7901 Long term (current) use of anticoagulants: Secondary | ICD-10-CM | POA: Insufficient documentation

## 2018-05-17 DIAGNOSIS — K219 Gastro-esophageal reflux disease without esophagitis: Secondary | ICD-10-CM | POA: Diagnosis not present

## 2018-05-17 DIAGNOSIS — Z833 Family history of diabetes mellitus: Secondary | ICD-10-CM | POA: Diagnosis not present

## 2018-05-17 DIAGNOSIS — I471 Supraventricular tachycardia: Secondary | ICD-10-CM | POA: Insufficient documentation

## 2018-05-17 DIAGNOSIS — Z87891 Personal history of nicotine dependence: Secondary | ICD-10-CM | POA: Diagnosis not present

## 2018-05-17 DIAGNOSIS — F419 Anxiety disorder, unspecified: Secondary | ICD-10-CM | POA: Insufficient documentation

## 2018-05-17 DIAGNOSIS — J449 Chronic obstructive pulmonary disease, unspecified: Secondary | ICD-10-CM | POA: Insufficient documentation

## 2018-05-17 DIAGNOSIS — Z9981 Dependence on supplemental oxygen: Secondary | ICD-10-CM | POA: Insufficient documentation

## 2018-05-17 DIAGNOSIS — I10 Essential (primary) hypertension: Secondary | ICD-10-CM

## 2018-05-17 DIAGNOSIS — R0602 Shortness of breath: Secondary | ICD-10-CM | POA: Diagnosis not present

## 2018-05-17 DIAGNOSIS — J410 Simple chronic bronchitis: Secondary | ICD-10-CM

## 2018-05-17 DIAGNOSIS — Z8249 Family history of ischemic heart disease and other diseases of the circulatory system: Secondary | ICD-10-CM | POA: Diagnosis not present

## 2018-05-17 DIAGNOSIS — I4891 Unspecified atrial fibrillation: Secondary | ICD-10-CM | POA: Diagnosis not present

## 2018-05-17 DIAGNOSIS — Z79899 Other long term (current) drug therapy: Secondary | ICD-10-CM | POA: Insufficient documentation

## 2018-05-17 DIAGNOSIS — K900 Celiac disease: Secondary | ICD-10-CM | POA: Diagnosis not present

## 2018-05-17 LAB — BASIC METABOLIC PANEL
Anion gap: 7 (ref 5–15)
BUN: 17 mg/dL (ref 8–23)
CO2: 31 mmol/L (ref 22–32)
Calcium: 8.9 mg/dL (ref 8.9–10.3)
Chloride: 102 mmol/L (ref 98–111)
Creatinine, Ser: 0.97 mg/dL (ref 0.61–1.24)
GFR calc Af Amer: >60 mL/min (ref >60)
GFR calc non Af Amer: >60 mL/min (ref >60)
Glucose, Bld: 170 mg/dL — ABNORMAL HIGH (ref 70–99)
Potassium: 5.1 mmol/L (ref 3.5–5.1)
Sodium: 140 mmol/L (ref 135–145)

## 2018-05-17 NOTE — Progress Notes (Signed)
Patient ID: Lawrence [name], male    DOB: 05/05/1956, 62 y.o.   MRN: [id number]  HPI  [name] is a 62 y/o male with a history of DM, PSVT, HTN, COPD (chronic oxygen), CAD with CABG, asthma, anxiety, anemia, atrial fibrillation, obstructive sleep apnea,  past tobacco use and chronic heart failure.   Echo report from 04/19/18 reviewed and showed an EF of 50-55%. Echo report from 11/10/17 reviewed and showed an EF of 50% along with mild AR/MR/TR. Echo done 03/27/16 and showed an EF of 60-65% along with trivial MR/TR. EF has improved from 45-50% November 2016. Most recent cardiac catheterization was done 11/02/16 and a DES was placed in the SVG to OM1 artery.   Was in the ED 04/30/18 due to abdominal pain/ shortness of breath. Was given IV fluids and GI follow-up. Released that same day. Was in the ED 03/25/18 due to ruptured stye of his left upper eyelid. Treated and released. Was in the ED 03/21/18 due to right foot pain after a fall. Xray negative for fracture and he was released.   He presents today for a follow-up visit with a chief complaint of minimal shortness of breath upon moderate exertion. He describes this as chronic in nature having been present for several years. He has associated fatigue, cough, palpitations, abdominal distention, chronic back and difficulty sleeping along with this. He denies any pedal edema, chest pain, wheezing, dizziness or weight gain. He hasn't been weighing himself daily as he keeps forgetting as he's been dealing with his wife's recent cancer diagnosis. Has not been wearing his CPAP either.   Past Medical History:  Diagnosis Date  . A-fib (Lawrence [name])   . Anemia   . Anginal pain (Lawrence [name])   . Anxiety   . Arthritis   . Asthma   . CAD (coronary artery disease)    a. 2002 CABGx2 (LIMA->LAD, VG->VG->OM1);  b. 09/2012 DES->OM;  c. 03/2015 PTCA of LAD [facility]) in setting of atretic LIMA; d. 05/2015 Cath [facility]): nonobs dzs; e. 06/2015 Cath ([facility]): LM nl, LAD 45p/d ISR, 50d, D1/2 small,  LCX 50p/d ISR, OM1 70ost, 30 ISR, VG->OM1 50ost, 22m LIMA->LAD 99p/d - atretic, RCA dom, nl; f.cath 10/16: 40-50%(FFR 0.90) pLAD, 75% (FFR 0.77) mLAD s/p PCI/DES, oRCA 40% (FFR0.95)  . Celiac disease   . Chronic diastolic CHF (congestive heart failure) (Lawrence [name]    a. 06/2009 Echo: EF 60-65%, Gr 1 DD, triv AI, mildly dil LA, nl RV.  .Marland KitchenCOPD (chronic obstructive pulmonary disease) (Lawrence [name]    a. Chronic bronchitis and emphysema.  . Diverticulosis   . Dysrhythmia   . Essential hypertension   . GERD (gastroesophageal reflux disease)   . History of tobacco abuse    a. Quit 2014.  .Marland KitchenPancreatitis   . PSVT (paroxysmal supraventricular tachycardia) (Lawrence [name]    a. 10/2012 Noted on Zio Patch.  . Tubular adenoma of colon   . Type II diabetes mellitus ([place]ud[place] [name]    Past Surgical History:  Procedure Laterality Date  . BYPASS GRAFT    . CARDIAC CATHETERIZATION N/A 07/12/2015   rocedure: Left Heart Cath and Cors/Grafts Angiography;  Surgeon: HBelva Crome MD;  [facility];  Service: Cardiovascular;  Laterality: N/A;  . CARDIAC CATHETERIZATION Right 10/07/2015   Procedure: Left Heart Cath and Cors/Grafts Angiography;  Surgeon: SDionisio David MD;  [facility];  Service: Cardiovascular;  Laterality: Right;  . CARDIAC CATHETERIZATION N/A 04/06/2016   Procedure: Left Heart Cath and Coronary  Angiography;  Surgeon: Yolonda Kida, MD;  Location: [place] [facility];  Service: Cardiovascular;  Laterality: N/A;  . CARDIAC CATHETERIZATION  04/06/2016   Procedure: Bypass Graft Angiography;  Surgeon: Yolonda Kida, MD;  Location: [place] CV LAB;  Service: Cardiovascular;;  . CARDIAC CATHETERIZATION N/A 11/02/2016   Procedure: Left Heart Cath and Cors/Grafts Angiography and possible PCI;  Surgeon: Yolonda Kida, MD;  Location: [place] [facility] LAB;  Service: Cardiovascular;  Laterality: N/A;  . CARDIAC CATHETERIZATION N/A 11/02/2016   Procedure: Coronary Stent Intervention;   Surgeon: Yolonda Kida, MD;  Location: [place] [facility] LAB;  Service: Cardiovascular;  Laterality: N/A;  . CHOLECYSTECTOMY    . COLONOSCOPY WITH PROPOFOL N/A 04/01/2018   Procedure: COLONOSCOPY WITH PROPOFOL;  Surgeon: Manya Silvas, MD;  Location: [facility]COPY;  Service: Endoscopy;  Laterality: N/A;  . ESOPHAGEAL DILATION    . ESOPHAGOGASTRODUODENOSCOPY (EGD) WITH PROPOFOL N/A 04/01/2018   Procedure: ESOPHAGOGASTRODUODENOSCOPY (EGD) WITH PROPOFOL;  Surgeon: Manya Silvas, MD;  Location: [facility] ENDOSCOPY;  Service: Endoscopy;  Laterality: N/A;  . TONSILLECTOMY    . VASCULAR SURGERY     Family History  Problem Relation Age of Onset  . Heart attack Mother   . Depression Mother   . Heart disease Mother   . COPD Mother   . Hypertension Mother   . Heart attack Father   . Diabetes Father   . Depression Father   . Heart disease Father   . Cirrhosis Father   . Parkinson's disease Brother    Social History   Tobacco Use  . Smoking status: Former Smoker    Last attempt to quit: 04/22/2013    Years since quitting: 5.0  . Smokeless tobacco: Never Used  Substance Use Topics  . Alcohol use: No    Comment: remotely quit alcohol use. Hx of heavy alcohol use.   Allergies  Allergen Reactions  . Demerol [Meperidine] Hives  . Jardiance [Empagliflozin]     Perineal pain  . Prednisone Other (See Comments) and Hypertension    Pt states that this medication puts him in A-fib   . Sulfa Antibiotics Hives  . Albuterol Sulfate [Albuterol] Palpitations and Other (See Comments)    Pt currently uses this medication.    . Morphine Sulfate Nausea And Vomiting, Rash and Other (See Comments)    Pt states that he is only allergic to the tablet form of this medication.     Prior to Admission medications   Medication Sig Start Date End Date Taking? Authorizing Provider  albuterol (PROVENTIL HFA;VENTOLIN HFA) 108 (90 Base) MCG/ACT inhaler Inhale 2 puffs into the lungs every 4 (four) hours as  needed for wheezing or shortness of breath. Reported on 04/08/2016 10/21/17  Yes Birdie Sons, MD  albuterol (PROVENTIL) (2.5 MG/3ML) 0.083% nebulizer solution Take 3 mLs (2.5 mg total) by nebulization every 6 (six) hours as needed for wheezing or shortness of breath. 01/04/18  Yes Birdie Sons, MD  allopurinol (ZYLOPRIM) 100 MG tablet TAKE 3 TABLETS BY MOUTH EVERY DAY. 03/11/18  Yes Birdie Sons, MD  ALPRAZolam Duanne Moron) 1 MG tablet TAKE ONE-HALF TO ONE TABLET BY MOUTH THREE TIMES A DAY AS NEEDED 03/24/18  Yes Birdie Sons, MD  aspirin 81 MG chewable tablet Chew 81 mg by mouth daily.   Yes [provider]  atorvastatin (LIPITOR) 80 MG tablet TAKE ONE TABLET BY MOUTH EVERY NIGHT AT BEDTIME 08/13/17  Yes Birdie Sons, MD  Blood Glucose  Monitoring Suppl (ACCU-CHEK AVIVA PLUS) w/Device KIT CHECK BLOOD SUGAR EVERY DAY 12/08/17  Yes Birdie Sons, MD  cetirizine (ZYRTEC) 10 MG tablet TAKE ONE TABLET BY MOUTH AT BEDTIME. Patient taking differently: 1-2 tid prn 05/12/17  Yes Birdie Sons, MD  cyclobenzaprine (FLEXERIL) 5 MG tablet TAKE 1 TO 2 TABLETS BY MOUTH THREE TIMESDAILY AS NEEDED (BACK PAIN) 11/15/17  Yes Birdie Sons, MD  docusate sodium (COLACE) 100 MG capsule Take 100 mg by mouth daily.    Yes [provider]  ELIQUIS 5 MG TABS tablet TAKE 1 TABLET BY MOUTH TWICE A DAY. 07/28/17  Yes Birdie Sons, MD  fluticasone furoate-vilanterol (BREO ELLIPTA) 100-25 MCG/INH AEPB Inhale 1 puff into the lungs daily. 05/13/18  Yes Devona Konig A, MD  glucose blood (ACCU-CHEK AVIVA PLUS) test strip Use to check blood sugar once a day 02/14/18  Yes Birdie Sons, MD  isosorbide mononitrate (IMDUR) 60 MG 24 hr tablet Take 1 tablet (60 mg total) by mouth daily. Patient taking differently: Take 60 mg by mouth 2 (two) times daily.  11/04/16  Yes Sainani, Belia Heman, MD  lisinopril (PRINIVIL,ZESTRIL) 2.5 MG tablet TAKE 1 TABLET BY MOUTH EVERY DAY 03/11/18  Yes Birdie Sons, MD   magnesium oxide (MAG-OX) 400 (241.3 Mg) MG tablet TAKE 1 TABLET BY MOUTH EVERY DAY 05/03/18  Yes Darylene Price A, FNP  metFORMIN (GLUCOPHAGE) 500 MG tablet TAKE 1 TABLET BY MOUTH EVERY MORNING 11/30/17  Yes Birdie Sons, MD  nitroGLYCERIN (NITROSTAT) 0.4 MG SL tablet Place 1 tablet (0.4 mg total) under the tongue every 5 (five) minutes as needed for chest pain. Reported on 05/26/2016 10/06/16  Yes Birdie Sons, MD  omega-3 acid ethyl esters (LOVAZA) 1 g capsule TAKE FOUR CAPSULES BY MOUTH DAILY 06/04/17  Yes Birdie Sons, MD  omeprazole (PRILOSEC) 40 MG capsule Take 1 capsule by mouth 2 (two) times daily. 04/22/18 04/22/19 Yes [provider]  ondansetron (ZOFRAN ODT) 4 MG disintegrating tablet Take 1 tablet (4 mg total) by mouth every 8 (eight) hours as needed for nausea or vomiting. 04/30/18  Yes Earleen Newport, MD  OXYGEN Inhale 2 L into the lungs. Wears ATC and at night   Yes [provider]  RANEXA 500 MG 12 hr tablet Take 1,000 mg by mouth 2 (two) times daily.   Yes [provider]  sotalol (BETAPACE) 120 MG tablet Take 120 mg by mouth 2 (two) times daily.    Yes [provider]  SPIRIVA HANDIHALER 18 MCG inhalation capsule INHALE ONE CAPSULE AS DIRECTED ONCE A DAY 02/03/18  Yes Birdie Sons, MD  tamsulosin (FLOMAX) 0.4 MG CAPS capsule Take 0.4 mg by mouth daily.    Yes [provider]  torsemide (DEMADEX) 100 MG tablet Take 0.5 tablets by mouth daily. 10/11/17  Yes [provider]  oxyCODONE-acetaminophen (PERCOCET) 10-325 MG tablet Take 1 tablet by mouth every 4 (four) hours as needed. 05/18/18   Birdie Sons, MD    Review of Systems  Constitutional: Positive for fatigue. Negative for appetite change.  HENT: Negative for congestion, rhinorrhea and sore throat.   Eyes: Negative.   Respiratory: Positive for cough and shortness of breath (with moderate exertion). Negative for chest tightness and wheezing.   Cardiovascular:  Positive for palpitations (infrequently). Negative for chest pain and leg swelling.  Gastrointestinal: Positive for abdominal distention. Negative for abdominal pain.  Endocrine: Negative.   Genitourinary: Negative.   Musculoskeletal: Positive  for back pain (chronic). Negative for neck pain.  Skin: Negative.   Allergic/Immunologic: Negative.   Neurological: Negative for dizziness, weakness and light-headedness.  Hematological: Negative for adenopathy. Bruises/bleeds easily.  Psychiatric/Behavioral: Positive for sleep disturbance (wearing oxygen at 2L; not wearing CPAP due to space issues). Negative for dysphoric mood and suicidal ideas. The patient is not nervous/anxious.    Vitals:   05/17/18 0948  BP: 113/62  Pulse: 60  Resp: 18  SpO2: 94%  Weight: 254 lb 6 oz (115.4 kg)  Height: 5' 7"  (1.702 m)   Wt Readings from Last 3 Encounters:  05/17/18 254 lb 6 oz (115.4 kg)  04/30/18 250 lb (113.4 kg)  04/27/18 250 lb 6 oz (113.6 kg)   Lab Results  Component Value Date   CREATININE 1.50 (H) 04/30/2018   CREATININE 1.18 04/19/2018   CREATININE 1.26 (H) 04/18/2018   Physical Exam  Constitutional: He is oriented to person, place, and time. He appears well-developed and well-nourished.  HENT:  Head: Normocephalic and atraumatic.  Neck: Normal range of motion. Neck supple. No JVD present.  Cardiovascular: Regular rhythm. Bradycardia present.  Pulmonary/Chest: Effort normal. He has no wheezes. He has no rales.  Abdominal: Soft. He exhibits distension. There is no tenderness.  Musculoskeletal: He exhibits edema (trace pitting bilateral lower legs). He exhibits no tenderness.  Neurological: He is alert and oriented to person, place, and time.  Skin: Skin is warm and dry.  Psychiatric: He has a normal mood and affect. His behavior is normal. Thought content normal.  Nursing note and vitals reviewed.  Assessment & Plan:  1: Chronic heart failure with preserved ejection fraction- - NYHA  class II - mildly fluid overloaded today - not weighing daily; Instructed to call for an overnight weight gain of >2 pounds or a weekly weight gain of >5 pounds. Reminded him that he needs to take care of himself or he won't be able to help take of his wife - weight up 4 pounds since he was last here a few weeks ago - didn't take all metolazone doses as ED told him to stop it due to possible dehydration - will get a BMP today to evaluate renal function - not adding salt and is currently rinsing canned foods. Reviewed the importance of closely following a 2052m sodium diet - drinking ~ 64 ounces of liquids daily - saw cardiologist ([name] 03/03/18 - BNP 04/30/18 was 28.0 - PharmD reconciled medications with the patient  2: HTN- - BP looks good today - saw PCP ([name] 01/24/18 - BMP 04/30/18 reviewed and showed sodium 137, potassium 3.7 and GFR 47  3: COPD- - recently had pulmonary function test - continues to wear oxygen at 2L around the clock - saw pulmonologist (Humphrey Rolls 03/14/18  4: Diabetes-  - fasting glucose at home yesterday was 145  5: Obstructive sleep apnea- - patient is not currently using his CPAP at night for his OSA because he needs a new cord. He called company [employer] who stated that cord was $70. Patient states that he cannot afford this.  - patient is to call [employer] back and get information about how to order this cord - discussed how untreated sleep apnea can affect his heart   Patient did not bring his medications nor a list. Each medication was verbally reviewed with the patient and he was encouraged to bring the bottles to every visit to confirm accuracy of list.  Return in 3 months or sooner for any questions/problems before then.

## 2018-05-17 NOTE — Patient Instructions (Addendum)
Resume weighing daily and call for an overnight weight gain of > 2 pounds or a weekly weight gain of >5 pounds.  Call about sleep study cord.

## 2018-05-18 ENCOUNTER — Other Ambulatory Visit: Payer: Self-pay | Admitting: Family Medicine

## 2018-05-18 DIAGNOSIS — J441 Chronic obstructive pulmonary disease with (acute) exacerbation: Secondary | ICD-10-CM

## 2018-05-18 MED ORDER — OXYCODONE-ACETAMINOPHEN 10-325 MG PO TABS: 1.0000 | ORAL_TABLET | ORAL | 0 refills | Status: DC | PRN

## 2018-05-18 NOTE — Telephone Encounter (Signed)
Pt needs refill on his oxycodone 10-325  [facility]  [place]

## 2018-05-23 ENCOUNTER — Other Ambulatory Visit: Payer: Self-pay | Admitting: Family Medicine

## 2018-05-23 DIAGNOSIS — K5909 Other constipation: Secondary | ICD-10-CM | POA: Diagnosis not present

## 2018-05-23 DIAGNOSIS — R1310 Dysphagia, unspecified: Secondary | ICD-10-CM | POA: Diagnosis not present

## 2018-05-23 DIAGNOSIS — G8929 Other chronic pain: Secondary | ICD-10-CM | POA: Diagnosis not present

## 2018-05-23 DIAGNOSIS — R1013 Epigastric pain: Secondary | ICD-10-CM | POA: Diagnosis not present

## 2018-05-23 DIAGNOSIS — R1012 Left upper quadrant pain: Secondary | ICD-10-CM | POA: Diagnosis not present

## 2018-05-23 DIAGNOSIS — M549 Dorsalgia, unspecified: Secondary | ICD-10-CM

## 2018-05-25 ENCOUNTER — Other Ambulatory Visit: Payer: Self-pay | Admitting: Student

## 2018-05-25 DIAGNOSIS — R1310 Dysphagia, unspecified: Secondary | ICD-10-CM

## 2018-05-26 ENCOUNTER — Emergency Department: Payer: Medicare HMO

## 2018-05-26 ENCOUNTER — Emergency Department
Admission: EM | Admit: 2018-05-26 | Discharge: 2018-05-26 | Disposition: A | Discharge status: Home / Self Care | Payer: Medicare HMO | Attending: Emergency Medicine | Admitting: Emergency Medicine

## 2018-05-26 ENCOUNTER — Other Ambulatory Visit: Payer: Self-pay

## 2018-05-26 DIAGNOSIS — I2510 Atherosclerotic heart disease of native coronary artery without angina pectoris: Secondary | ICD-10-CM | POA: Diagnosis not present

## 2018-05-26 DIAGNOSIS — Z79899 Other long term (current) drug therapy: Secondary | ICD-10-CM | POA: Insufficient documentation

## 2018-05-26 DIAGNOSIS — R1084 Generalized abdominal pain: Secondary | ICD-10-CM

## 2018-05-26 DIAGNOSIS — K220 Achalasia of cardia: Secondary | ICD-10-CM | POA: Diagnosis not present

## 2018-05-26 DIAGNOSIS — Z7984 Long term (current) use of oral hypoglycemic drugs: Secondary | ICD-10-CM | POA: Diagnosis not present

## 2018-05-26 DIAGNOSIS — R112 Nausea with vomiting, unspecified: Secondary | ICD-10-CM | POA: Diagnosis not present

## 2018-05-26 DIAGNOSIS — I5032 Chronic diastolic (congestive) heart failure: Secondary | ICD-10-CM | POA: Diagnosis not present

## 2018-05-26 DIAGNOSIS — Z951 Presence of aortocoronary bypass graft: Secondary | ICD-10-CM | POA: Insufficient documentation

## 2018-05-26 DIAGNOSIS — E119 Type 2 diabetes mellitus without complications: Secondary | ICD-10-CM | POA: Diagnosis not present

## 2018-05-26 DIAGNOSIS — R109 Unspecified abdominal pain: Secondary | ICD-10-CM | POA: Diagnosis not present

## 2018-05-26 DIAGNOSIS — J45909 Unspecified asthma, uncomplicated: Secondary | ICD-10-CM | POA: Diagnosis not present

## 2018-05-26 DIAGNOSIS — I110 Hypertensive heart disease with heart failure: Secondary | ICD-10-CM | POA: Diagnosis not present

## 2018-05-26 LAB — URINALYSIS, COMPLETE (UACMP) WITH MICROSCOPIC
Bacteria, UA: NONE SEEN
Bilirubin Urine: NEGATIVE
Glucose, UA: >=500 mg/dL — AB
Hgb urine dipstick: NEGATIVE
Ketones, ur: NEGATIVE mg/dL
Leukocytes, UA: NEGATIVE
Nitrite: NEGATIVE
Protein, ur: NEGATIVE mg/dL
Specific Gravity, Urine: 1.003 — ABNORMAL LOW (ref 1.005–1.030)
pH: 5 (ref 5.0–8.0)

## 2018-05-26 LAB — COMPREHENSIVE METABOLIC PANEL
ALT: 30 U/L (ref 0–44)
AST: 26 U/L (ref 15–41)
Albumin: 3.8 g/dL (ref 3.5–5.0)
Alkaline Phosphatase: 72 U/L (ref 38–126)
Anion gap: 6 (ref 5–15)
BUN: 12 mg/dL (ref 8–23)
CO2: 31 mmol/L (ref 22–32)
Calcium: 9 mg/dL (ref 8.9–10.3)
Chloride: 101 mmol/L (ref 98–111)
Creatinine, Ser: 1.06 mg/dL (ref 0.61–1.24)
GFR calc Af Amer: >60 mL/min (ref >60)
GFR calc non Af Amer: >60 mL/min (ref >60)
Glucose, Bld: 227 mg/dL — ABNORMAL HIGH (ref 70–99)
Potassium: 4.3 mmol/L (ref 3.5–5.1)
Sodium: 138 mmol/L (ref 135–145)
Total Bilirubin: 0.7 mg/dL (ref 0.3–1.2)
Total Protein: 6.7 g/dL (ref 6.5–8.1)

## 2018-05-26 LAB — CBC WITH DIFFERENTIAL/PLATELET
Basophils Absolute: 0 10*3/uL (ref 0–0.1)
Basophils Relative: 1 %
Eosinophils Absolute: 0.2 10*3/uL (ref 0–0.7)
Eosinophils Relative: 5 %
HCT: 46.8 % (ref 40.0–52.0)
Hemoglobin: 15.7 g/dL (ref 13.0–18.0)
Lymphocytes Relative: 33 %
Lymphs Abs: 1.5 10*3/uL (ref 1.0–3.6)
MCH: 31.2 pg (ref 26.0–34.0)
MCHC: 33.6 g/dL (ref 32.0–36.0)
MCV: 93 fL (ref 80.0–100.0)
Monocytes Absolute: 0.5 10*3/uL (ref 0.2–1.0)
Monocytes Relative: 11 %
Neutro Abs: 2.3 10*3/uL (ref 1.4–6.5)
Neutrophils Relative %: 50 %
Platelets: 180 10*3/uL (ref 150–440)
RBC: 5.03 MIL/uL (ref 4.40–5.90)
RDW: 15.2 % — ABNORMAL HIGH (ref 11.5–14.5)
WBC: 4.6 10*3/uL (ref 3.8–10.6)

## 2018-05-26 LAB — BETA-HYDROXYBUTYRIC ACID: Beta-Hydroxybutyric Acid: 0.06 mmol/L (ref 0.05–0.27)

## 2018-05-26 LAB — LIPASE, BLOOD: Lipase: 27 U/L (ref 11–51)

## 2018-05-26 IMAGING — DX DG CHEST 1V PORT
1 series · 1 of 1 positions shown · non-contrast
Comparison: 06/24/2016

CLINICAL DATA: Central chest pain and dyspnea yesterday morning.

EXAM:
PORTABLE CHEST 1 VIEW

[chest ap]
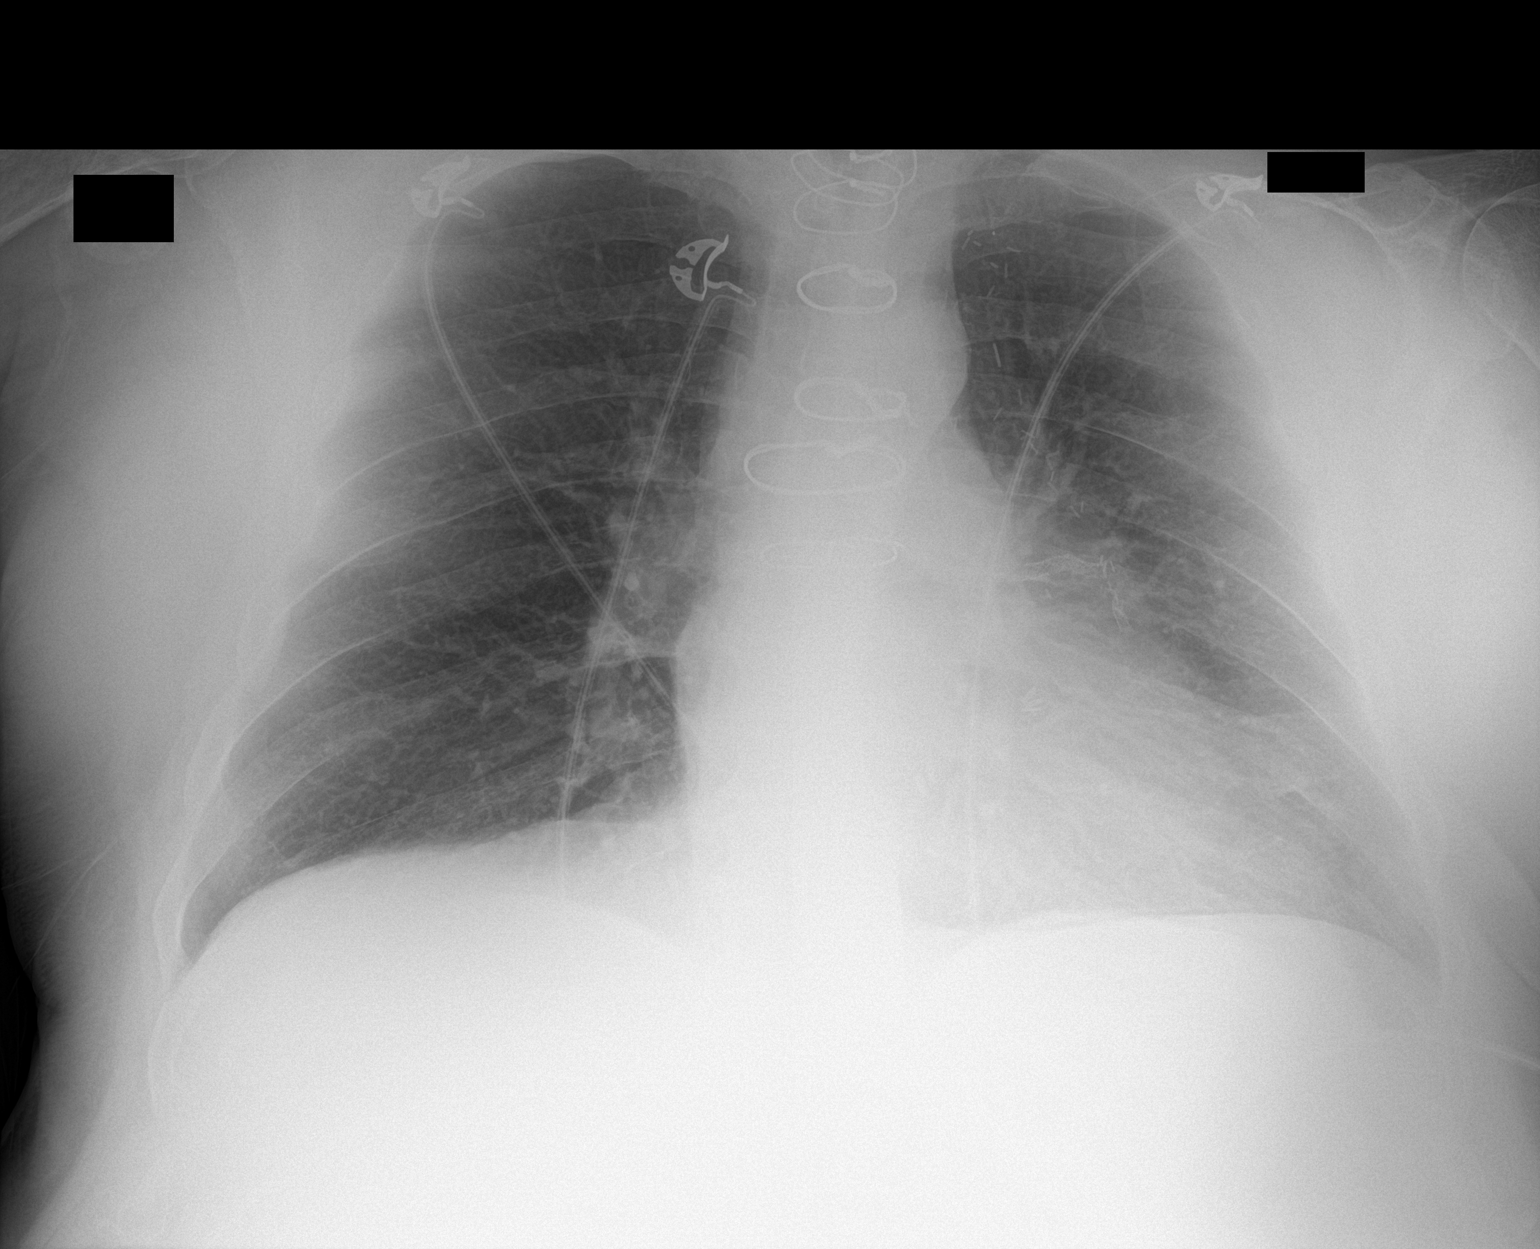

[1 of 1 positions shown; findings below may reference images not displayed]

FINDINGS: A single AP portable view of the chest demonstrates no focal
airspace consolidation or alveolar edema. The lungs are grossly
clear. There is no large effusion or pneumothorax. Cardiac and
mediastinal contours appear unremarkable.
IMPRESSION: No active disease.

## 2018-05-26 MED ORDER — HALOPERIDOL LACTATE 5 MG/ML IJ SOLN
2.5000 mg | Freq: Once | INTRAMUSCULAR | Status: AC
  Administered 2018-05-26: 2.5 mg via INTRAVENOUS
  Filled 2018-05-26: qty 1

## 2018-05-26 MED ORDER — SODIUM CHLORIDE 0.9 % IV BOLUS
500.0000 mL | Freq: Once | INTRAVENOUS | Status: AC
  Administered 2018-05-26: 500 mL via INTRAVENOUS

## 2018-05-26 MED ORDER — IOPAMIDOL (ISOVUE-300) INJECTION 61%
125.0000 mL | Freq: Once | INTRAVENOUS | Status: AC | PRN
  Administered 2018-05-26: 125 mL via INTRAVENOUS

## 2018-05-26 NOTE — ED Provider Notes (Signed)
[facility] Emergency Department Provider Note  ____________________________________________   None    (approximate)  I have reviewed the triage vital signs and the nursing notes.   HISTORY  Chief Complaint Abdominal Pain and Emesis   HPI Lawrence [name] is a 62 y.o. male who comes to the emergency department with abdominal pain nausea and vomiting for 2 years.  He is followed by gastroenterology for achalasia and is scheduled for a barium swallow next week.   He has had roughly 24 hours of worsening moderate severity intermittent cramping lower abdominal pain.  Nonradiating.  No diarrhea.  Remote history of cholecystectomy.   Past Medical History:  Diagnosis Date  . A-fib ([place])   . Anemia   . Anginal pain ([place])   . Anxiety   . Arthritis   . Asthma   . CAD (coronary artery disease)    a. 2002 CABGx2 (LIMA->LAD, VG->VG->OM1);  b. 09/2012 DES->OM;  c. 03/2015 PTCA of LAD [facility]) in setting of atretic LIMA; d. 05/2015 Cath [facility]): nonobs dzs; e. 06/2015 Cath ([facility]): LM nl, LAD 45p/d ISR, 50d, D1/2 small, LCX 50p/d ISR, OM1 70ost, 30 ISR, VG->OM1 50ost, 30m LIMA->LAD 99p/d - atretic, RCA dom, nl; f.cath 10/16: 40-50%(FFR 0.90) pLAD, 75% (FFR 0.77) mLAD s/p PCI/DES, oRCA 40% (FFR0.95)  . Celiac disease   . Chronic diastolic CHF (congestive heart failure) ([place]    a. 06/2009 Echo: EF 60-65%, Gr 1 DD, triv AI, mildly dil LA, nl RV.  .Marland KitchenCOPD (chronic obstructive pulmonary disease) ([place]    a. Chronic bronchitis and emphysema.  . Diverticulosis   . Dysrhythmia   . Essential hypertension   . GERD (gastroesophageal reflux disease)   . History of tobacco abuse    a. Quit 2014.  .Marland KitchenPancreatitis   . PSVT (paroxysmal supraventricular tachycardia) ([place]    a. 10/2012 Noted on Zio Patch.  . Tubular adenoma of colon   . Type II diabetes mellitus ([facility]     Patient Active Problem List   Diagnosis Date Noted  . Acute on chronic heart failure ([place] 04/18/2018  . History  of adenomatous polyp of colon 04/05/2018  . Abdominal pain, chronic, epigastric 11/06/2017  . Bilateral flank pain 03/24/2017  . Dyspnea 04/03/2016  . Hypotension 04/03/2016  . Chronic kidney disease 04/03/2016  . Anemia 04/03/2016  . Paroxysmal atrial fibrillation ([place] 12/23/2015  . OSA (obstructive sleep apnea) 12/10/2015  . Left inguinal hernia 11/07/2015  . Anxiety 11/07/2015  . Unstable angina ([place] 10/05/2015  . Back pain with left-sided radiculopathy 09/30/2015  . Nocturnal hypoxia 09/06/2015  . BPH (benign prostatic hyperplasia) 08/01/2015  . Chronic diastolic CHF (congestive heart failure) ([place]   . Angina pectoris ([place]   . Chest pain 07/11/2015  . COPD (chronic obstructive pulmonary disease) ([place] 07/03/2015  . CAD (coronary artery disease) 06/26/2015  . HTN (hypertension) 06/26/2015  . DM (diabetes mellitus) ([place] 06/26/2015  . Achalasia 07/24/2014  . GERD (gastroesophageal reflux disease) 06/07/2014  . Former tobacco use 04/11/2013  . HLD (hyperlipidemia) 04/09/2013    Past Surgical History:  Procedure Laterality Date  . BYPASS GRAFT    . CARDIAC CATHETERIZATION N/A 07/12/2015   rocedure: Left Heart Cath and Cors/Grafts Angiography;  Surgeon: HBelva Crome MD;  [place];  Service: Cardiovascular;  Laterality: N/A;  . CARDIAC CATHETERIZATION Right 10/07/2015   Procedure: Left Heart Cath and Cors/Grafts Angiography;  Surgeon: SDionisio David MD;  [place];  Service:  Cardiovascular;  Laterality: Right;  . CARDIAC CATHETERIZATION N/A 04/06/2016   Procedure: Left Heart Cath and Coronary Angiography;  Surgeon: Yolonda Kida, MD;  Location: [place] CV LAB;  Service: Cardiovascular;  Laterality: N/A;  . CARDIAC CATHETERIZATION  04/06/2016   Procedure: Bypass Graft Angiography;  Surgeon: Yolonda Kida, MD;  Location: [place] CV LAB;  Service: Cardiovascular;;  . CARDIAC CATHETERIZATION N/A 11/02/2016   Procedure:  Left Heart Cath and Cors/Grafts Angiography and possible PCI;  Surgeon: Yolonda Kida, MD;  Location: [place] CV LAB;  Service: Cardiovascular;  Laterality: N/A;  . CARDIAC CATHETERIZATION N/A 11/02/2016   Procedure: Coronary Stent Intervention;  Surgeon: Yolonda Kida, MD;  Location: [place] CV LAB;  Service: Cardiovascular;  Laterality: N/A;  . CHOLECYSTECTOMY    . COLONOSCOPY WITH PROPOFOL N/A 04/01/2018   Procedure: COLONOSCOPY WITH PROPOFOL;  Surgeon: Manya Silvas, MD;  Location: [facility] END[facility]COPY;  Service: Endoscopy;  Laterality: N/A;  . ESOPHAGEAL DILATION    . ESOPHAGOGASTRODUODENOSCOPY (EGD) WITH PROPOFOL N/A 04/01/2018   Procedure: ESOPHAGOGASTRODUODENOSCOPY (EGD) WITH PROPOFOL;  Surgeon: Manya Silvas, MD;  Location: [facility] ENDOSCOPY;  Service: Endoscopy;  Laterality: N/A;  . TONSILLECTOMY    . VASCULAR SURGERY      Prior to Admission medications   Medication Sig Start Date End Date Taking? Authorizing Provider  albuterol (PROVENTIL HFA;VENTOLIN HFA) 108 (90 Base) MCG/ACT inhaler Inhale 2 puffs into the lungs every 4 (four) hours as needed for wheezing or shortness of breath. Reported on 04/08/2016 10/21/17  Yes Birdie Sons, MD  albuterol (PROVENTIL) (2.5 MG/3ML) 0.083% nebulizer solution Take 3 mLs (2.5 mg total) by nebulization every 6 (six) hours as needed for wheezing or shortness of breath. 01/04/18  Yes Birdie Sons, MD  allopurinol (ZYLOPRIM) 100 MG tablet TAKE 3 TABLETS BY MOUTH EVERY DAY. 03/11/18  Yes Birdie Sons, MD  ALPRAZolam Duanne Moron) 1 MG tablet TAKE ONE-HALF TO ONE TABLET BY MOUTH THREE TIMES A DAY AS NEEDED 03/24/18  Yes Birdie Sons, MD  aspirin 81 MG chewable tablet Chew 81 mg by mouth daily.   Yes [provider]  atorvastatin (LIPITOR) 80 MG tablet TAKE ONE TABLET BY MOUTH EVERY NIGHT AT BEDTIME 08/13/17  Yes Birdie Sons, MD  cetirizine (ZYRTEC) 10 MG tablet TAKE ONE TABLET BY MOUTH AT BEDTIME. Patient taking  differently: 1-2 tid prn 05/12/17  Yes Birdie Sons, MD  cyclobenzaprine (FLEXERIL) 5 MG tablet TAKE 1 TO 2 TABLETS BY MOUTH THREE TIMESDAILY AS NEEDED (BACK PAIN) 05/24/18  Yes Birdie Sons, MD  docusate sodium (COLACE) 100 MG capsule Take 100 mg by mouth daily.    Yes [provider]  ELIQUIS 5 MG TABS tablet TAKE 1 TABLET BY MOUTH TWICE A DAY. 07/28/17  Yes Birdie Sons, MD  fluticasone furoate-vilanterol (BREO ELLIPTA) 100-25 MCG/INH AEPB Inhale 1 puff into the lungs daily. 05/13/18  Yes Allyne Gee, MD  isosorbide mononitrate (IMDUR) 60 MG 24 hr tablet Take 1 tablet (60 mg total) by mouth daily. Patient taking differently: Take 60 mg by mouth 2 (two) times daily.  11/04/16  Yes Sainani, Belia Heman, MD  LINZESS 72 MCG capsule Take 72 mcg by mouth daily.   Yes [provider]  lisinopril (PRINIVIL,ZESTRIL) 2.5 MG tablet TAKE 1 TABLET BY MOUTH EVERY DAY 03/11/18  Yes Birdie Sons, MD  magnesium oxide (MAG-OX) 400 (241.3 Mg) MG tablet TAKE 1 TABLET BY MOUTH EVERY DAY 05/03/18  Yes  Darylene Price A, FNP  metFORMIN (GLUCOPHAGE) 500 MG tablet TAKE 1 TABLET BY MOUTH EVERY MORNING 11/30/17  Yes Birdie Sons, MD  nitroGLYCERIN (NITROSTAT) 0.4 MG SL tablet Place 1 tablet (0.4 mg total) under the tongue every 5 (five) minutes as needed for chest pain. Reported on 05/26/2016 10/06/16  Yes Birdie Sons, MD  omega-3 acid ethyl esters (LOVAZA) 1 g capsule TAKE FOUR CAPSULES BY MOUTH DAILY 06/04/17  Yes Birdie Sons, MD  omeprazole (PRILOSEC) 40 MG capsule Take 1 capsule by mouth 2 (two) times daily. 04/22/18 04/22/19 Yes [provider]  ondansetron (ZOFRAN ODT) 4 MG disintegrating tablet Take 1 tablet (4 mg total) by mouth every 8 (eight) hours as needed for nausea or vomiting. 04/30/18  Yes Earleen Newport, MD  oxyCODONE-acetaminophen (PERCOCET) 10-325 MG tablet Take 1 tablet by mouth every 4 (four) hours as needed. 05/18/18  Yes Birdie Sons, MD  RANEXA 500 MG 12  hr tablet Take 1,000 mg by mouth 2 (two) times daily.   Yes [provider]  sotalol (BETAPACE) 120 MG tablet Take 120 mg by mouth 2 (two) times daily.    Yes [provider]  SPIRIVA HANDIHALER 18 MCG inhalation capsule INHALE ONE CAPSULE AS DIRECTED ONCE A DAY 02/03/18  Yes Birdie Sons, MD  tamsulosin (FLOMAX) 0.4 MG CAPS capsule Take 0.4 mg by mouth daily.    Yes [provider]  torsemide (DEMADEX) 100 MG tablet Take 0.5 tablets by mouth daily. 10/11/17  Yes [provider]  Blood Glucose Monitoring Suppl (ACCU-CHEK AVIVA PLUS) w/Device KIT CHECK BLOOD SUGAR EVERY DAY 12/08/17   Birdie Sons, MD  glucose blood (ACCU-CHEK AVIVA PLUS) test strip Use to check blood sugar once a day 02/14/18   Birdie Sons, MD  OXYGEN Inhale 2 L into the lungs. Wears ATC and at night    [provider]    Allergies Demerol [meperidine]; Jardiance [empagliflozin]; Prednisone; Sulfa antibiotics; Albuterol sulfate [albuterol]; and Morphine sulfate  Family History  Problem Relation Age of Onset  . Heart attack Mother   . Depression Mother   . Heart disease Mother   . COPD Mother   . Hypertension Mother   . Heart attack Father   . Diabetes Father   . Depression Father   . Heart disease Father   . Cirrhosis Father   . Parkinson's disease Brother     Social History Social History   Tobacco Use  . Smoking status: Former Smoker    Last attempt to quit: 04/22/2013    Years since quitting: 5.1  . Smokeless tobacco: Never Used  Substance Use Topics  . Alcohol use: No    Comment: remotely quit alcohol use. Hx of heavy alcohol use.  . Drug use: No    Review of Systems Constitutional: No fever/chills Eyes: No visual changes. ENT: No sore throat. Cardiovascular: Denies chest pain. Respiratory: Denies shortness of breath. Gastrointestinal: Positive for abdominal pain.  Positive for nausea, positive for vomiting.  No diarrhea.  No  constipation. Genitourinary: Negative for dysuria. Musculoskeletal: Negative for back pain. Skin: Negative for rash. Neurological: Negative for headaches, focal weakness or numbness.   ____________________________________________   PHYSICAL EXAM:  VITAL SIGNS: ED Triage Vitals  Enc Vitals Group     BP      Pulse      Resp      Temp      Temp src      SpO2  Weight      Height      Head Circumference      Peak Flow      Pain Score      Pain Loc      Pain Edu?      Excl. in [place]?     Constitutional: Alert and oriented x4 uncomfortable appearing morbidly obese nontoxic no diaphoresis Eyes: PERRL EOMI. Head: Atraumatic. Nose: No congestion/rhinnorhea. Mouth/Throat: No trismus Neck: No stridor.   Cardiovascular: Normal rate, regular rhythm. Grossly normal heart sounds.  Good peripheral circulation. Respiratory: Normal respiratory effort.  No retractions. Lungs CTAB and moving good air Gastrointestinal: Obese soft diffuse mild tenderness with no peritonitis or focality Musculoskeletal: No lower extremity edema   Neurologic:  Normal speech and language. No gross focal neurologic deficits are appreciated. Skin:  Skin is warm, dry and intact. No rash noted. Psychiatric: Mood and affect are normal. Speech and behavior are normal.    ____________________________________________   DIFFERENTIAL includes but not limited to  Bowel obstruction, diverticulitis, achalasia, gastritis ____________________________________________   LABS (all labs ordered are listed, but only abnormal results are displayed)  Labs Reviewed  COMPREHENSIVE METABOLIC PANEL - Abnormal; Notable for the following components:      Result Value   Glucose, Bld 227 (*)    All other components within normal limits  CBC WITH DIFFERENTIAL/PLATELET - Abnormal; Notable for the following components:   RDW 15.2 (*)    All other components within normal limits  URINALYSIS, COMPLETE (UACMP) WITH MICROSCOPIC -  Abnormal; Notable for the following components:   Color, Urine STRAW (*)    APPearance CLEAR (*)    Specific Gravity, Urine 1.003 (*)    Glucose, UA >=500 (*)    All other components within normal limits  LIPASE, BLOOD  BETA-HYDROXYBUTYRIC ACID    Lab work reviewed by me with no acute disease __________________________________________  EKG   ____________________________________________  RADIOLOGY  CT abdomen pelvis reviewed by me with no acute disease ____________________________________________   PROCEDURES  Procedure(s) performed: no  Procedures  Critical Care performed: no  ____________________________________________   INITIAL IMPRESSION / ASSESSMENT AND PLAN / ED COURSE  Pertinent labs & imaging results that were available during my care of the patient were reviewed by me and considered in my medical decision making (see chart for details).   Patient arrives uncomfortable appearing.  Given IV fentanyl with improvement in his symptoms.  CT scan obtained given his complicated abdominal history and fortunately is reassuring.  I have encouraged him to follow-up with his gastroenterologist.  He is able to eat and drink and is discharged home with strict return precautions given.      ____________________________________________   FINAL CLINICAL IMPRESSION(S) / ED DIAGNOSES  Final diagnoses:  Generalized abdominal pain  Achalasia      NEW MEDICATIONS STARTED DURING THIS VISIT:  Discharge Medication List as of 05/26/2018 10:58 PM       Note:  This document was prepared using Dragon voice recognition software and may include unintentional dictation errors.     Darel Hong, MD 05/28/18 [phone]

## 2018-05-26 NOTE — ED Triage Notes (Signed)
Pt arrives to ED via POV from home with c/o abdominal pain and vomiting "x2 years". Pt reports being seen at [facility] and has a swallow test scheduled for Monday. Pt reports LLQ and RLQ abdominal pain with 8 episodes of emesis over the last 24 hrs. Pt denies CP, diarrhea, or fever. Pt with COPD and on continuous 2L O2 .

## 2018-05-26 NOTE — Discharge Instructions (Signed)
Fortunately today your blood work and your CT scan were reassuring.  Please return follow-up with your primary care physician and your gastroenterologist as scheduled and return to the emergency department for any concerns whatsoever.  It was a pleasure to take care of you today, and thank you for coming to our emergency department.  If you have any questions or concerns before leaving please ask the nurse to grab me and I'm more than happy to go through your aftercare instructions again.  If you were prescribed any opioid pain medication today such as Norco, Vicodin, Percocet, morphine, hydrocodone, or oxycodone please make sure you do not drive when you are taking this medication as it can alter your ability to drive safely.  If you have any concerns once you are home that you are not improving or are in fact getting worse before you can make it to your follow-up appointment, please do not hesitate to call 911 and come back for further evaluation.  Darel Hong, MD  Results for orders placed or performed during the hospital encounter of 05/26/18  Comprehensive metabolic panel  Result Value Ref Range   Sodium 138 135 - 145 mmol/L   Potassium 4.3 3.5 - 5.1 mmol/L   Chloride 101 98 - 111 mmol/L   CO2 31 22 - 32 mmol/L   Glucose, Bld 227 (H) 70 - 99 mg/dL   BUN 12 8 - 23 mg/dL   Creatinine, Ser 1.06 0.61 - 1.24 mg/dL   Calcium 9.0 8.9 - 10.3 mg/dL   Total Protein 6.7 6.5 - 8.1 g/dL   Albumin 3.8 3.5 - 5.0 g/dL   AST 26 15 - 41 U/L   ALT 30 0 - 44 U/L   Alkaline Phosphatase 72 38 - 126 U/L   Total Bilirubin 0.7 0.3 - 1.2 mg/dL   GFR calc non Af Amer >60 >60 mL/min   GFR calc Af Amer >60 >60 mL/min   Anion gap 6 5 - 15  Lipase, blood  Result Value Ref Range   Lipase 27 11 - 51 U/L  CBC with Differential  Result Value Ref Range   WBC 4.6 3.8 - 10.6 K/uL   RBC 5.03 4.40 - 5.90 MIL/uL   Hemoglobin 15.7 13.0 - 18.0 g/dL   HCT 46.8 40.0 - 52.0 %   MCV 93.0 80.0 - 100.0 fL   MCH 31.2  26.0 - 34.0 pg   MCHC 33.6 32.0 - 36.0 g/dL   RDW 15.2 (H) 11.5 - 14.5 %   Platelets 180 150 - 440 K/uL   Neutrophils Relative % 50 %   Neutro Abs 2.3 1.4 - 6.5 K/uL   Lymphocytes Relative 33 %   Lymphs Abs 1.5 1.0 - 3.6 K/uL   Monocytes Relative 11 %   Monocytes Absolute 0.5 0.2 - 1.0 K/uL   Eosinophils Relative 5 %   Eosinophils Absolute 0.2 0 - 0.7 K/uL   Basophils Relative 1 %   Basophils Absolute 0.0 0 - 0.1 K/uL  Urinalysis, Complete w Microscopic  Result Value Ref Range   Color, Urine STRAW (A) YELLOW   APPearance CLEAR (A) CLEAR   Specific Gravity, Urine 1.003 (L) 1.005 - 1.030   pH 5.0 5.0 - 8.0   Glucose, UA >=500 (A) NEGATIVE mg/dL   Hgb urine dipstick NEGATIVE NEGATIVE   Bilirubin Urine NEGATIVE NEGATIVE   Ketones, ur NEGATIVE NEGATIVE mg/dL   Protein, ur NEGATIVE NEGATIVE mg/dL   Nitrite NEGATIVE NEGATIVE   Leukocytes, UA NEGATIVE NEGATIVE  RBC / HPF 0-5 0 - 5 RBC/hpf   WBC, UA 0-5 0 - 5 WBC/hpf   Bacteria, UA NONE SEEN NONE SEEN   Squamous Epithelial / LPF 0-5 0 - 5  Beta-hydroxybutyric acid  Result Value Ref Range   Beta-Hydroxybutyric Acid 0.06 0.05 - 0.27 mmol/L   Dg Chest 2 View  Result Date: 04/30/2018 CLINICAL DATA:  Shortness of breath. EXAM: CHEST - 2 VIEW COMPARISON:  04/18/2018 FINDINGS: Normal heart size. Previous median sternotomy and CABG procedure. No pleural effusion or edema identified. No airspace opacities. IMPRESSION: 1. No acute cardiopulmonary abnormalities. Electronically Signed   By: Kerby Moors M.D.   On: 04/30/2018 16:40   Ct Abdomen Pelvis W Contrast  Result Date: 05/26/2018 CLINICAL DATA:  62 y/o M; lower abdominal pain with emesis over the past 24 hours. EXAM: CT ABDOMEN AND PELVIS WITH CONTRAST TECHNIQUE: Multidetector CT imaging of the abdomen and pelvis was performed using the standard protocol following bolus administration of intravenous contrast. CONTRAST:  152m ISOVUE-300 IOPAMIDOL (ISOVUE-300) INJECTION 61% COMPARISON:   10/27/2017 CT abdomen and pelvis. FINDINGS: Lower chest: No acute abnormality. Hepatobiliary: Hepatic steatosis. No focal liver abnormality is seen. Status post cholecystectomy. No biliary dilatation. Pancreas: Unremarkable. No pancreatic ductal dilatation or surrounding inflammatory changes. Spleen: Normal in size without focal abnormality. Adrenals/Urinary Tract: Stable adrenal nodular hypertrophy. Kidneys are normal, without renal calculi, focal lesion, or hydronephrosis. Bladder is unremarkable. Stomach/Bowel: Mild distal esophageal dilatation and wall thickening. Debris within the lower esophagus. Normal appearance of the stomach. Small stable superiorly directed duodenum diverticulum arising from the third segment of duodenum. No obstructive or inflammatory changes of jejunum, ileum, or colon. Mild sigmoid diverticulosis, no findings of acute diverticulitis. Vascular/Lymphatic: Aortic atherosclerosis. No enlarged abdominal or pelvic lymph nodes. Reproductive: Prostate is unremarkable. Other: Stable small left inguinal hernia containing fat. Musculoskeletal: No fracture is seen. Mild lumbar spondylosis with prominent facet arthropathy. IMPRESSION: 1. Mild distention of the distal esophagus containing debris. Stable mild wall thickening of distal esophagus. 2. Hepatic steatosis. 3. Stable adrenal nodular hypertrophy. 4. Aortic atherosclerosis. 5. Stable small left inguinal hernia containing fat. Electronically Signed   By: LKristine GarbeM.D.   On: 05/26/2018 22:53

## 2018-05-30 ENCOUNTER — Other Ambulatory Visit: Payer: Self-pay | Admitting: Student

## 2018-05-30 ENCOUNTER — Ambulatory Visit
Admission: RE | Admit: 2018-05-30 | Discharge: 2018-05-30 | Disposition: A | Discharge status: Home / Self Care | Payer: Medicare HMO | Source: Ambulatory Visit | Attending: Student | Admitting: Student

## 2018-05-30 DIAGNOSIS — K2970 Gastritis, unspecified, without bleeding: Secondary | ICD-10-CM | POA: Insufficient documentation

## 2018-05-30 DIAGNOSIS — K219 Gastro-esophageal reflux disease without esophagitis: Secondary | ICD-10-CM | POA: Diagnosis not present

## 2018-05-30 DIAGNOSIS — R1310 Dysphagia, unspecified: Secondary | ICD-10-CM | POA: Diagnosis present

## 2018-05-30 DIAGNOSIS — K449 Diaphragmatic hernia without obstruction or gangrene: Secondary | ICD-10-CM | POA: Insufficient documentation

## 2018-06-03 IMAGING — CR DG CHEST 2V
1 series · 2 of 2 positions shown · non-contrast
Comparison: Chest radiograph performed 07/14/2016

CLINICAL DATA: Acute onset of left-sided chest and flank pain.
Epigastric abdominal pain. Initial encounter.

EXAM:
CHEST  2 VIEW

[Series 1: dg chest 2 view · 0.14mm/px · 2 of 2 slices shown]
[im 1/2]
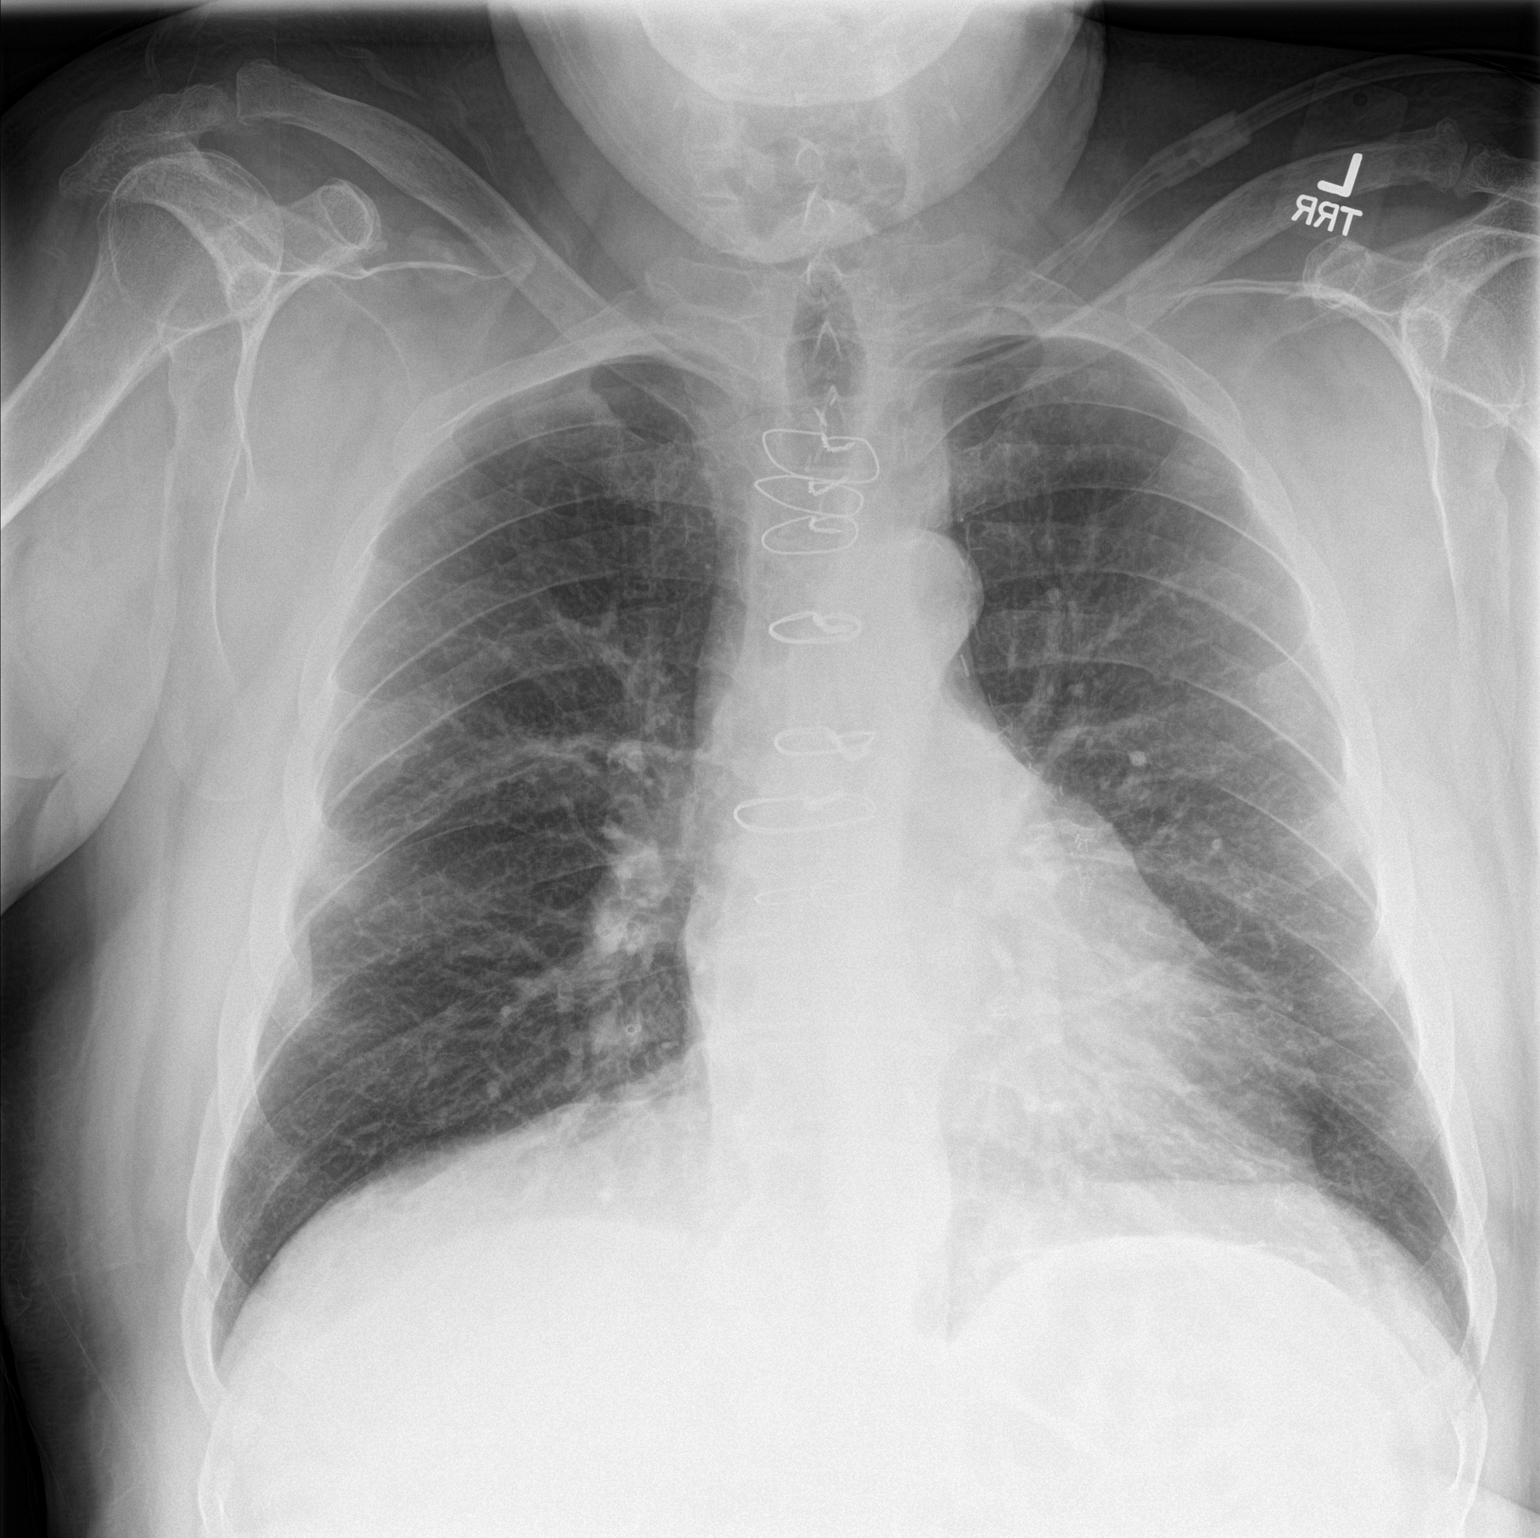
[im 2/2]
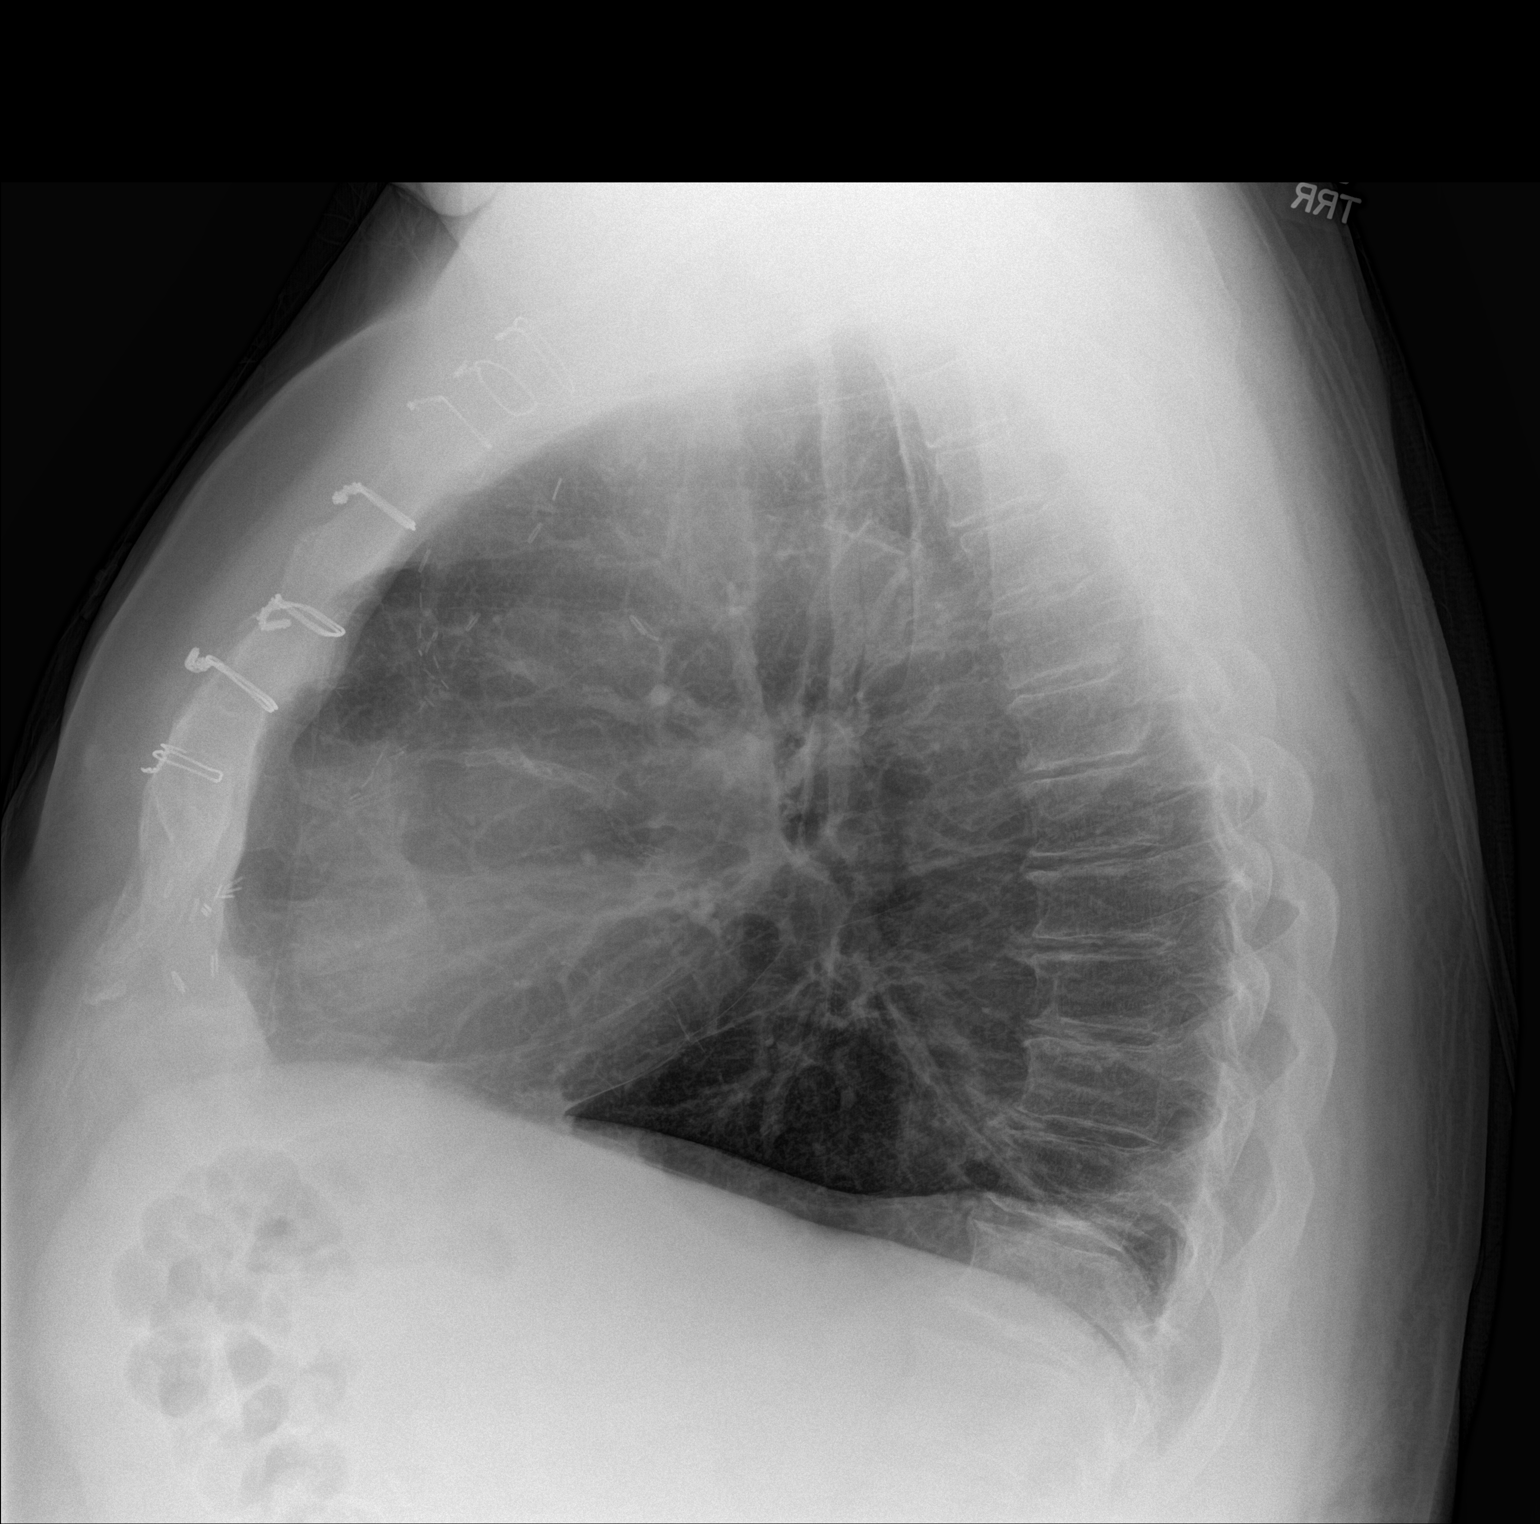

[2 of 2 positions shown; findings below may reference images not displayed]

FINDINGS: The lungs are well-aerated. Mild vascular congestion is noted, with
mild left basilar atelectasis. There is no evidence of pleural
effusion or pneumothorax.

The heart is normal in size; the patient is status post median
sternotomy. No acute osseous abnormalities are seen.
IMPRESSION: Mild vascular congestion, with mild left basilar atelectasis.

## 2018-06-08 DIAGNOSIS — R079 Chest pain, unspecified: Secondary | ICD-10-CM | POA: Diagnosis not present

## 2018-06-08 DIAGNOSIS — F17210 Nicotine dependence, cigarettes, uncomplicated: Secondary | ICD-10-CM | POA: Diagnosis not present

## 2018-06-08 DIAGNOSIS — I5032 Chronic diastolic (congestive) heart failure: Secondary | ICD-10-CM | POA: Diagnosis not present

## 2018-06-08 DIAGNOSIS — K2970 Gastritis, unspecified, without bleeding: Secondary | ICD-10-CM | POA: Diagnosis not present

## 2018-06-08 DIAGNOSIS — I2510 Atherosclerotic heart disease of native coronary artery without angina pectoris: Secondary | ICD-10-CM | POA: Diagnosis not present

## 2018-06-08 DIAGNOSIS — K219 Gastro-esophageal reflux disease without esophagitis: Secondary | ICD-10-CM | POA: Diagnosis not present

## 2018-06-08 DIAGNOSIS — M79602 Pain in left arm: Secondary | ICD-10-CM | POA: Diagnosis not present

## 2018-06-08 DIAGNOSIS — I5089 Other heart failure: Secondary | ICD-10-CM | POA: Diagnosis not present

## 2018-06-08 DIAGNOSIS — I4891 Unspecified atrial fibrillation: Secondary | ICD-10-CM | POA: Diagnosis not present

## 2018-06-08 DIAGNOSIS — E7800 Pure hypercholesterolemia, unspecified: Secondary | ICD-10-CM | POA: Diagnosis not present

## 2018-06-08 DIAGNOSIS — I471 Supraventricular tachycardia: Secondary | ICD-10-CM | POA: Diagnosis not present

## 2018-06-08 DIAGNOSIS — R61 Generalized hyperhidrosis: Secondary | ICD-10-CM | POA: Diagnosis not present

## 2018-06-08 DIAGNOSIS — R0789 Other chest pain: Secondary | ICD-10-CM | POA: Diagnosis not present

## 2018-06-08 DIAGNOSIS — I110 Hypertensive heart disease with heart failure: Secondary | ICD-10-CM | POA: Diagnosis not present

## 2018-06-09 DIAGNOSIS — I2510 Atherosclerotic heart disease of native coronary artery without angina pectoris: Secondary | ICD-10-CM | POA: Diagnosis not present

## 2018-06-09 DIAGNOSIS — E119 Type 2 diabetes mellitus without complications: Secondary | ICD-10-CM | POA: Diagnosis not present

## 2018-06-09 DIAGNOSIS — I25110 Atherosclerotic heart disease of native coronary artery with unstable angina pectoris: Secondary | ICD-10-CM | POA: Diagnosis not present

## 2018-06-09 DIAGNOSIS — J449 Chronic obstructive pulmonary disease, unspecified: Secondary | ICD-10-CM | POA: Diagnosis not present

## 2018-06-09 DIAGNOSIS — I4891 Unspecified atrial fibrillation: Secondary | ICD-10-CM | POA: Diagnosis not present

## 2018-06-09 DIAGNOSIS — R001 Bradycardia, unspecified: Secondary | ICD-10-CM | POA: Diagnosis not present

## 2018-06-09 DIAGNOSIS — I10 Essential (primary) hypertension: Secondary | ICD-10-CM | POA: Diagnosis not present

## 2018-06-09 DIAGNOSIS — I471 Supraventricular tachycardia: Secondary | ICD-10-CM | POA: Diagnosis not present

## 2018-06-09 DIAGNOSIS — R079 Chest pain, unspecified: Secondary | ICD-10-CM | POA: Diagnosis not present

## 2018-06-09 DIAGNOSIS — I5030 Unspecified diastolic (congestive) heart failure: Secondary | ICD-10-CM | POA: Diagnosis not present

## 2018-06-09 DIAGNOSIS — Z951 Presence of aortocoronary bypass graft: Secondary | ICD-10-CM | POA: Diagnosis not present

## 2018-06-09 DIAGNOSIS — R0789 Other chest pain: Secondary | ICD-10-CM | POA: Diagnosis not present

## 2018-06-10 DIAGNOSIS — R079 Chest pain, unspecified: Secondary | ICD-10-CM | POA: Diagnosis not present

## 2018-06-10 MED ORDER — SUCRALFATE 1 GM/10ML PO SUSP
1.00 | ORAL | Status: DC
Start: 2018-06-10 — End: 2018-06-10

## 2018-06-10 MED ORDER — BUDESONIDE-FORMOTEROL FUMARATE 80-4.5 MCG/ACT IN AERO
2.00 | INHALATION_SPRAY | RESPIRATORY_TRACT | Status: DC
Start: 2018-06-10 — End: 2018-06-10

## 2018-06-10 MED ORDER — OXYCODONE-ACETAMINOPHEN 5-325 MG PO TABS
2.00 | ORAL_TABLET | ORAL | Status: DC
Start: ? — End: 2018-06-10

## 2018-06-10 MED ORDER — ATORVASTATIN CALCIUM 80 MG PO TABS
80.00 | ORAL_TABLET | ORAL | Status: DC
Start: 2018-06-10 — End: 2018-06-10

## 2018-06-10 MED ORDER — MORPHINE SULFATE 2 MG/ML IJ SOLN
2.00 | INTRAMUSCULAR | Status: DC
Start: ? — End: 2018-06-10

## 2018-06-10 MED ORDER — TIOTROPIUM BROMIDE MONOHYDRATE 18 MCG IN CAPS
18.00 | ORAL_CAPSULE | RESPIRATORY_TRACT | Status: DC
Start: 2018-06-11 — End: 2018-06-10

## 2018-06-10 MED ORDER — CYCLOBENZAPRINE HCL 10 MG PO TABS
5.00 | ORAL_TABLET | ORAL | Status: DC
Start: ? — End: 2018-06-10

## 2018-06-10 MED ORDER — ISOSORBIDE MONONITRATE ER 30 MG PO TB24
60.00 | ORAL_TABLET | ORAL | Status: DC
Start: 2018-06-11 — End: 2018-06-10

## 2018-06-10 MED ORDER — ALUM & MAG HYDROXIDE-SIMETH 400-400-40 MG/5ML PO SUSP
30.00 | ORAL | Status: DC
Start: ? — End: 2018-06-10

## 2018-06-10 MED ORDER — ALLOPURINOL 300 MG PO TABS
300.00 | ORAL_TABLET | ORAL | Status: DC
Start: 2018-06-10 — End: 2018-06-10

## 2018-06-10 MED ORDER — ASPIRIN 81 MG PO CHEW
81.00 | CHEWABLE_TABLET | ORAL | Status: DC
Start: 2018-06-11 — End: 2018-06-10

## 2018-06-10 MED ORDER — CETIRIZINE HCL 10 MG PO TABS
10.00 | ORAL_TABLET | ORAL | Status: DC
Start: 2018-06-10 — End: 2018-06-10

## 2018-06-10 MED ORDER — NITROGLYCERIN 0.4 MG SL SUBL
0.40 | SUBLINGUAL_TABLET | SUBLINGUAL | Status: DC
Start: ? — End: 2018-06-10

## 2018-06-10 MED ORDER — ALPRAZOLAM 0.5 MG PO TABS
1.00 | ORAL_TABLET | ORAL | Status: DC
Start: ? — End: 2018-06-10

## 2018-06-10 MED ORDER — GENERIC EXTERNAL MEDICATION
50.00 | Status: DC
Start: 2018-06-11 — End: 2018-06-10

## 2018-06-10 MED ORDER — PROMETHAZINE HCL 12.5 MG PO TABS
12.50 | ORAL_TABLET | ORAL | Status: DC
Start: ? — End: 2018-06-10

## 2018-06-10 MED ORDER — CITALOPRAM HYDROBROMIDE 20 MG PO TABS
20.00 | ORAL_TABLET | ORAL | Status: DC
Start: 2018-06-11 — End: 2018-06-10

## 2018-06-10 MED ORDER — TAMSULOSIN HCL 0.4 MG PO CAPS
0.40 | ORAL_CAPSULE | ORAL | Status: DC
Start: 2018-06-10 — End: 2018-06-10

## 2018-06-10 MED ORDER — POLYETHYLENE GLYCOL 3350 17 G PO PACK
17.00 | PACK | ORAL | Status: DC
Start: ? — End: 2018-06-10

## 2018-06-10 MED ORDER — PANTOPRAZOLE SODIUM 20 MG PO TBEC
40.00 | DELAYED_RELEASE_TABLET | ORAL | Status: DC
Start: 2018-06-10 — End: 2018-06-10

## 2018-06-10 MED ORDER — ALBUTEROL SULFATE HFA 108 (90 BASE) MCG/ACT IN AERS
2.00 | INHALATION_SPRAY | RESPIRATORY_TRACT | Status: DC
Start: ? — End: 2018-06-10

## 2018-06-10 MED ORDER — KETOCONAZOLE 2 % EX CREA
1.00 | TOPICAL_CREAM | CUTANEOUS | Status: DC
Start: 2018-06-11 — End: 2018-06-10

## 2018-06-10 MED ORDER — SOTALOL HCL 120 MG PO TABS
120.00 | ORAL_TABLET | ORAL | Status: DC
Start: 2018-06-10 — End: 2018-06-10

## 2018-06-10 MED ORDER — LISINOPRIL 5 MG PO TABS
5.00 | ORAL_TABLET | ORAL | Status: DC
Start: 2018-06-11 — End: 2018-06-10

## 2018-06-11 DIAGNOSIS — G4710 Hypersomnia, unspecified: Secondary | ICD-10-CM | POA: Diagnosis not present

## 2018-06-11 DIAGNOSIS — J439 Emphysema, unspecified: Secondary | ICD-10-CM | POA: Diagnosis not present

## 2018-06-11 DIAGNOSIS — J449 Chronic obstructive pulmonary disease, unspecified: Secondary | ICD-10-CM | POA: Diagnosis not present

## 2018-06-11 DIAGNOSIS — I10 Essential (primary) hypertension: Secondary | ICD-10-CM | POA: Diagnosis not present

## 2018-06-14 NOTE — Progress Notes (Signed)
Patient: Lawrence [name] Male    DOB: [birth year]   62 y.o.   MRN: [id number] Visit Date: 06/15/2018  Today's Provider: [name], MD   Chief Complaint  Patient presents with  . Hospitalization Follow-up   Subjective:    HPI   Follow up ER visit  Patient was seen in ER for Chest pain on 06/08/2018. He was treated for chest pain. Treatment for this included; heart cath. Discontinued amlodipine 10 mg and started Imdur 60 mg which he is now taking BID He reports good compliance with treatment. He reports this condition is Improved. He is not having any chest pain since discharge. ---------------------------------------------------------------  Patient states he is feeling better since being dicharged from hospital.    Allergies  Allergen Reactions  . Demerol [Meperidine] Hives  . Jardiance [Empagliflozin]     Perineal pain  . Prednisone Other (See Comments) and Hypertension    Pt states that this medication puts him in A-fib   . Sulfa Antibiotics Hives  . Albuterol Sulfate [Albuterol] Palpitations and Other (See Comments)    Pt currently uses this medication.    . Morphine Sulfate Nausea And Vomiting, Rash and Other (See Comments)    Pt states that he is only allergic to the tablet form of this medication.       Current Outpatient Medications:  .  albuterol (PROVENTIL HFA;VENTOLIN HFA) 108 (90 Base) MCG/ACT inhaler, Inhale 2 puffs into the lungs every 4 (four) hours as needed for wheezing or shortness of breath. Reported on 04/08/2016, Disp: 1 Inhaler, Rfl: 5 .  albuterol (PROVENTIL) (2.5 MG/3ML) 0.083% nebulizer solution, Take 3 mLs (2.5 mg total) by nebulization every 6 (six) hours as needed for wheezing or shortness of breath., Disp: 150 mL, Rfl: 1 .  allopurinol (ZYLOPRIM) 100 MG tablet, TAKE 3 TABLETS BY MOUTH EVERY DAY., Disp: 90 tablet, Rfl: 4 .  ALPRAZolam (XANAX) 1 MG tablet, TAKE ONE-HALF TO ONE TABLET BY MOUTH THREE TIMES A DAY AS NEEDED, Disp: 90 tablet,  Rfl: 5 .  aspirin 81 MG chewable tablet, Chew 81 mg by mouth daily., Disp: , Rfl:  .  atorvastatin (LIPITOR) 80 MG tablet, TAKE ONE TABLET BY MOUTH EVERY NIGHT AT BEDTIME, Disp: 90 tablet, Rfl: 4 .  Blood Glucose Monitoring Suppl (ACCU-CHEK AVIVA PLUS) w/Device KIT, CHECK BLOOD SUGAR EVERY DAY, Disp: 1 kit, Rfl: 0 .  cetirizine (ZYRTEC) 10 MG tablet, TAKE ONE TABLET BY MOUTH AT BEDTIME. (Patient taking differently: 1-2 tid prn), Disp: 30 tablet, Rfl: 11 .  cyclobenzaprine (FLEXERIL) 5 MG tablet, TAKE 1 TO 2 TABLETS BY MOUTH THREE TIMESDAILY AS NEEDED (BACK PAIN), Disp: 60 tablet, Rfl: 5 .  docusate sodium (COLACE) 100 MG capsule, Take 100 mg by mouth daily. , Disp: , Rfl:  .  ELIQUIS 5 MG TABS tablet, TAKE 1 TABLET BY MOUTH TWICE A DAY., Disp: 60 tablet, Rfl: 12 .  fluticasone furoate-vilanterol (BREO ELLIPTA) 100-25 MCG/INH AEPB, Inhale 1 puff into the lungs daily., Disp: 1 each, Rfl: 2 .  glucose blood (ACCU-CHEK AVIVA PLUS) test strip, Use to check blood sugar once a day, Disp: 100 each, Rfl: 4 .  isosorbide mononitrate (IMDUR) 60 MG 24 hr tablet, Take 1 tablet (60 mg total) by mouth daily. (Patient taking differently: Take 60 mg by mouth 2 (two) times daily. ), Disp: 60 tablet, Rfl: 1 .  LINZESS 72 MCG capsule, Take 72 mcg by mouth daily., Disp: , Rfl:  .  lisinopril (PRINIVIL,ZESTRIL)  2.5 MG tablet, TAKE 1 TABLET BY MOUTH EVERY DAY, Disp: 30 tablet, Rfl: 5 .  magnesium oxide (MAG-OX) 400 (241.3 Mg) MG tablet, TAKE 1 TABLET BY MOUTH EVERY DAY, Disp: 30 tablet, Rfl: 5 .  metFORMIN (GLUCOPHAGE) 500 MG tablet, TAKE 1 TABLET BY MOUTH EVERY MORNING, Disp: 90 tablet, Rfl: 4 .  nitroGLYCERIN (NITROSTAT) 0.4 MG SL tablet, Place 1 tablet (0.4 mg total) under the tongue every 5 (five) minutes as needed for chest pain. Reported on 05/26/2016, Disp: 30 tablet, Rfl: 1 .  omega-3 acid ethyl esters (LOVAZA) 1 g capsule, TAKE FOUR CAPSULES BY MOUTH DAILY, Disp: 120 capsule, Rfl: 12 .  omeprazole (PRILOSEC) 40 MG  capsule, Take 1 capsule by mouth 2 (two) times daily., Disp: , Rfl:  .  ondansetron (ZOFRAN ODT) 4 MG disintegrating tablet, Take 1 tablet (4 mg total) by mouth every 8 (eight) hours as needed for nausea or vomiting., Disp: 20 tablet, Rfl: 1 .  oxyCODONE-acetaminophen (PERCOCET) 10-325 MG tablet, Take 1 tablet by mouth every 4 (four) hours as needed., Disp: 150 tablet, Rfl: 0 .  OXYGEN, Inhale 2 L into the lungs. Wears ATC and at night, Disp: , Rfl:  .  RANEXA 500 MG 12 hr tablet, Take 1,000 mg by mouth 2 (two) times daily., Disp: , Rfl:  .  sotalol (BETAPACE) 120 MG tablet, Take 120 mg by mouth 2 (two) times daily. , Disp: , Rfl:  .  SPIRIVA HANDIHALER 18 MCG inhalation capsule, INHALE ONE CAPSULE AS DIRECTED ONCE A DAY, Disp: 30 capsule, Rfl: 12 .  tamsulosin (FLOMAX) 0.4 MG CAPS capsule, Take 0.4 mg by mouth daily. , Disp: , Rfl:  .  torsemide (DEMADEX) 100 MG tablet, Take 0.5 tablets by mouth daily., Disp: , Rfl:   Review of Systems  Constitutional: Negative for appetite change, chills and fever.  Respiratory: Negative for chest tightness, shortness of breath and wheezing.   Cardiovascular: Negative for chest pain and palpitations.  Gastrointestinal: Negative for abdominal pain, nausea and vomiting.    Social History   Tobacco Use  . Smoking status: Former Smoker    Last attempt to quit: 04/22/2013    Years since quitting: 5.1  . Smokeless tobacco: Never Used  Substance Use Topics  . Alcohol use: No    Comment: remotely quit alcohol use. Hx of heavy alcohol use.   Objective:   BP 138/86 (BP Location: Right Arm, Patient Position: Sitting, Cuff Size: Large)   Pulse (!) 54   Temp 97.6 F (36.4 C) (Oral)   Resp 16   Ht 5' 7"  (1.702 m)   Wt 248 lb (112.5 kg)   SpO2 97%   BMI 38.84 kg/m  Vitals:   06/15/18 1557  BP: 138/86  Pulse: (!) 54  Resp: 16  Temp: 97.6 F (36.4 C)  TempSrc: Oral  SpO2: 97%  Weight: 248 lb (112.5 kg)  Height: 5' 7"  (1.702 m)     Physical  Exam   General Appearance:    Alert, cooperative, no distress  Eyes:    PERRL, conjunctiva/corneas clear, EOM's intact       Lungs:     Clear to auscultation bilaterally, respirations unlabored  Heart:    Regular rate and rhythm  Neurologic:   Awake, alert, oriented x 3. No apparent focal neurological           defect.           Assessment & Plan:     1. Chest pain, unspecified  type Much better with Imdur. BP fairly well controlled off of amlodipine. Continue current medications.  Follow up cardiology as scheduled.   2. Type 2 diabetes mellitus with other circulatory complication, without long-term current use of insulin ([place]) He has been having higher blood sugars, sometimes into the 200s lately. He will increase - metFORMIN (GLUCOPHAGE) 500 MG tablet; Take 1 tablet (500 mg total) by mouth 2 (two) times daily with a meal.  Dispense: 60 tablet; Refill: 11  Follow up for diabetes in September as scheduled.   3. Back pain with left-sided radiculopathy refill - oxyCODONE-acetaminophen (PERCOCET) 10-325 MG tablet; Take 1 tablet by mouth every 4 (four) hours as needed.  Dispense: 150 tablet; Refill: 0  Discussed importance of having naloxone available in case of overdose. He states he has some available at all times.       [name], MD  [facility]

## 2018-06-15 ENCOUNTER — Ambulatory Visit (INDEPENDENT_AMBULATORY_CARE_PROVIDER_SITE_OTHER): Payer: Medicare HMO | Admitting: Family Medicine

## 2018-06-15 ENCOUNTER — Encounter: Payer: Self-pay | Admitting: Family Medicine

## 2018-06-15 VITALS — BP 138/86 | HR 54 | Temp 97.6°F | Resp 16 | Ht 67.0 in | Wt 248.0 lb

## 2018-06-15 DIAGNOSIS — R079 Chest pain, unspecified: Secondary | ICD-10-CM

## 2018-06-15 DIAGNOSIS — E1159 Type 2 diabetes mellitus with other circulatory complications: Secondary | ICD-10-CM | POA: Diagnosis not present

## 2018-06-15 DIAGNOSIS — M5410 Radiculopathy, site unspecified: Secondary | ICD-10-CM | POA: Diagnosis not present

## 2018-06-15 MED ORDER — OXYCODONE-ACETAMINOPHEN 10-325 MG PO TABS
1.0000 | ORAL_TABLET | ORAL | 0 refills | Status: DC | PRN
Start: 2018-06-15 — End: 2018-07-14

## 2018-06-15 MED ORDER — METFORMIN HCL 500 MG PO TABS: 500.0000 mg | ORAL_TABLET | Freq: Two times a day (BID) | ORAL | 11 refills | Status: DC

## 2018-06-20 ENCOUNTER — Encounter: Payer: Self-pay | Admitting: Internal Medicine

## 2018-06-20 ENCOUNTER — Ambulatory Visit (INDEPENDENT_AMBULATORY_CARE_PROVIDER_SITE_OTHER): Payer: Medicare HMO | Admitting: Internal Medicine

## 2018-06-20 VITALS — BP 123/73 | HR 59 | Resp 18 | Ht 67.0 in | Wt 254.0 lb

## 2018-06-20 DIAGNOSIS — I482 Chronic atrial fibrillation, unspecified: Secondary | ICD-10-CM

## 2018-06-20 DIAGNOSIS — J449 Chronic obstructive pulmonary disease, unspecified: Secondary | ICD-10-CM

## 2018-06-20 DIAGNOSIS — J4489 Other specified chronic obstructive pulmonary disease: Secondary | ICD-10-CM

## 2018-06-20 DIAGNOSIS — R0602 Shortness of breath: Secondary | ICD-10-CM | POA: Diagnosis not present

## 2018-06-20 DIAGNOSIS — I509 Heart failure, unspecified: Secondary | ICD-10-CM | POA: Diagnosis not present

## 2018-06-20 DIAGNOSIS — J9611 Chronic respiratory failure with hypoxia: Secondary | ICD-10-CM | POA: Diagnosis not present

## 2018-06-20 NOTE — Progress Notes (Signed)
[facility] [address] [place],  [address]  Pulmonary Sleep Medicine .  Office Visit Note  Patient Name: Lawrence [name] DOB: Jan 29, 1956 MRN [id number]  Date of Service: 06/20/2018  Complaints/HPI: Patient is here for follow-up of COPD.  Patient had pulmonary function studies done and his FEV1 was 70% of predicted.  In addition he has a history of congestive heart failure as well as atrial fibrillation.  He does follow cardiology for the CHF and atrial fibrillation.  Patient had an echocardiogram done which reveals a normal ejection fraction.  I did not see if he has pulmonary hypertension on that echo that I can discern.  Last chest x-ray did not show any acute cardiopulmonary disease.  He also did go to the emergency room in July for chest pain was worked up unremarkable.  Currently he is on oxygen he states the oxygen does benefit him.  He states that he is without oxygen he has more shortness of breath noted.  ROS  General: (-) fever, (-) chills, (-) night sweats, (-) weakness Skin: (-) rashes, (-) itching,. Eyes: (-) visual changes, (-) redness, (-) itching. Nose and Sinuses: (-) nasal stuffiness or itchiness, (-) postnasal drip, (-) nosebleeds, (-) sinus trouble. Mouth and Throat: (-) sore throat, (-) hoarseness. Neck: (-) swollen glands, (-) enlarged thyroid, (-) neck pain. Respiratory: + cough, (-) bloody sputum, + shortness of breath, - wheezing. Cardiovascular: - ankle swelling, (-) chest pain. Lymphatic: (-) lymph node enlargement. Neurologic: (-) numbness, (-) tingling. Psychiatric: (-) anxiety, (-) depression   Current Medication: Outpatient Encounter Medications as of 06/20/2018  Medication Sig Note  . albuterol (PROVENTIL HFA;VENTOLIN HFA) 108 (90 Base) MCG/ACT inhaler Inhale 2 puffs into the lungs every 4 (four) hours as needed for wheezing or shortness of breath. Reported on 04/08/2016   . albuterol (PROVENTIL) (2.5 MG/3ML) 0.083% nebulizer  solution Take 3 mLs (2.5 mg total) by nebulization every 6 (six) hours as needed for wheezing or shortness of breath.   . allopurinol (ZYLOPRIM) 100 MG tablet TAKE 3 TABLETS BY MOUTH EVERY DAY.   Marland Kitchen ALPRAZolam (XANAX) 1 MG tablet TAKE ONE-HALF TO ONE TABLET BY MOUTH THREE TIMES A DAY AS NEEDED   . aspirin 81 MG chewable tablet Chew 81 mg by mouth daily.   Marland Kitchen atorvastatin (LIPITOR) 80 MG tablet TAKE ONE TABLET BY MOUTH EVERY NIGHT AT BEDTIME   . Blood Glucose Monitoring Suppl (ACCU-CHEK AVIVA PLUS) w/Device KIT CHECK BLOOD SUGAR EVERY DAY   . cetirizine (ZYRTEC) 10 MG tablet TAKE ONE TABLET BY MOUTH AT BEDTIME. (Patient taking differently: 1-2 tid prn)   . cyclobenzaprine (FLEXERIL) 5 MG tablet TAKE 1 TO 2 TABLETS BY MOUTH THREE TIMESDAILY AS NEEDED (BACK PAIN)   . docusate sodium (COLACE) 100 MG capsule Take 100 mg by mouth daily.  11/13/2016: Take 2 capsules once a day   . ELIQUIS 5 MG TABS tablet TAKE 1 TABLET BY MOUTH TWICE A DAY.   Marland Kitchen empagliflozin (JARDIANCE) 10 MG TABS tablet Take by mouth.   . fluticasone furoate-vilanterol (BREO ELLIPTA) 100-25 MCG/INH AEPB Inhale 1 puff into the lungs daily.   Marland Kitchen glucose blood (ACCU-CHEK AVIVA PLUS) test strip Use to check blood sugar once a day   . isosorbide mononitrate (IMDUR) 60 MG 24 hr tablet Take 1 tablet (60 mg total) by mouth daily. (Patient taking differently: Take 60 mg by mouth 2 (two) times daily. )   . LINZESS 72 MCG capsule Take 72 mcg by mouth daily.   Marland Kitchen  lisinopril (PRINIVIL,ZESTRIL) 2.5 MG tablet TAKE 1 TABLET BY MOUTH EVERY DAY   . magnesium oxide (MAG-OX) 400 (241.3 Mg) MG tablet TAKE 1 TABLET BY MOUTH EVERY DAY   . metFORMIN (GLUCOPHAGE) 500 MG tablet Take 1 tablet (500 mg total) by mouth 2 (two) times daily with a meal.   . nitroGLYCERIN (NITROSTAT) 0.4 MG SL tablet Place 1 tablet (0.4 mg total) under the tongue every 5 (five) minutes as needed for chest pain. Reported on 05/26/2016 11/13/2016: Available if needed.   Marland Kitchen omega-3 acid ethyl  esters (LOVAZA) 1 g capsule TAKE FOUR CAPSULES BY MOUTH DAILY   . omeprazole (PRILOSEC) 40 MG capsule Take 1 capsule by mouth 2 (two) times daily.   . ondansetron (ZOFRAN ODT) 4 MG disintegrating tablet Take 1 tablet (4 mg total) by mouth every 8 (eight) hours as needed for nausea or vomiting.   Marland Kitchen oxyCODONE-acetaminophen (PERCOCET) 10-325 MG tablet Take 1 tablet by mouth every 4 (four) hours as needed.   . OXYGEN Inhale 2 L into the lungs. Wears ATC and at night   . potassium chloride (K-DUR,KLOR-CON) 10 MEQ tablet Take by mouth.   Marland Kitchen RANEXA 500 MG 12 hr tablet Take 1,000 mg by mouth 2 (two) times daily.   . sotalol (BETAPACE) 120 MG tablet Take 120 mg by mouth 2 (two) times daily.    Marland Kitchen SPIRIVA HANDIHALER 18 MCG inhalation capsule INHALE ONE CAPSULE AS DIRECTED ONCE A DAY   . tamsulosin (FLOMAX) 0.4 MG CAPS capsule Take 0.4 mg by mouth daily.    Marland Kitchen torsemide (DEMADEX) 100 MG tablet Take 0.5 tablets by mouth daily.    No facility-administered encounter medications on file as of 06/20/2018.     Surgical History: Past Surgical History:  Procedure Laterality Date  . BYPASS GRAFT    . CARDIAC CATHETERIZATION N/A 07/12/2015   rocedure: Left Heart Cath and Cors/Grafts Angiography;  Surgeon: Belva Crome, MD;  Location: [place] [facility];  Service: Cardiovascular;  Laterality: N/A;  . CARDIAC CATHETERIZATION Right 10/07/2015   Procedure: Left Heart Cath and Cors/Grafts Angiography;  Surgeon: Dionisio David, MD;  Location: [place] [facility];  Service: Cardiovascular;  Laterality: Right;  . CARDIAC CATHETERIZATION N/A 04/06/2016   Procedure: Left Heart Cath and Coronary Angiography;  Surgeon: Yolonda Kida, MD;  Location: [place] [facility];  Service: Cardiovascular;  Laterality: N/A;  . CARDIAC CATHETERIZATION  04/06/2016   Procedure: Bypass Graft Angiography;  Surgeon: Yolonda Kida, MD;  Location: [place] [facility];  Service: Cardiovascular;;  . CARDIAC CATHETERIZATION N/A 11/02/2016    Procedure: Left Heart Cath and Cors/Grafts Angiography and possible PCI;  Surgeon: Yolonda Kida, MD;  Location: [place] [facility];  Service: Cardiovascular;  Laterality: N/A;  . CARDIAC CATHETERIZATION N/A 11/02/2016   Procedure: Coronary Stent Intervention;  Surgeon: Yolonda Kida, MD;  Location: [place] [facility];  Service: Cardiovascular;  Laterality: N/A;  . CHOLECYSTECTOMY    . COLONOSCOPY WITH PROPOFOL N/A 04/01/2018   Procedure: COLONOSCOPY WITH PROPOFOL;  Surgeon: Manya Silvas, MD;  Location: [facility] ENDOSCOPY;  Service: Endoscopy;  Laterality: N/A;  . ESOPHAGEAL DILATION    . ESOPHAGOGASTRODUODENOSCOPY (EGD) WITH PROPOFOL N/A 04/01/2018   Procedure: ESOPHAGOGASTRODUODENOSCOPY (EGD) WITH PROPOFOL;  Surgeon: Manya Silvas, MD;  Location: [facility] ENDOSCOPY;  Service: Endoscopy;  Laterality: N/A;  . TONSILLECTOMY    . VASCULAR SURGERY      Medical History: Past Medical History:  Diagnosis Date  . A-fib ([place])   .  Anemia   . Anginal pain ([place])   . Anxiety   . Arthritis   . Asthma   . CAD (coronary artery disease)    a. 2002 CABGx2 (LIMA->LAD, VG->VG->OM1);  b. 09/2012 DES->OM;  c. 03/2015 PTCA of LAD [facility]) in setting of atretic LIMA; d. 05/2015 Cath [facility]): nonobs dzs; e. 06/2015 Cath ([facility]): LM nl, LAD 45p/d ISR, 50d, D1/2 small, LCX 50p/d ISR, OM1 70ost, 30 ISR, VG->OM1 50ost, 51m LIMA->LAD 99p/d - atretic, RCA dom, nl; f.cath 10/16: 40-50%(FFR 0.90) pLAD, 75% (FFR 0.77) mLAD s/p PCI/DES, oRCA 40% (FFR0.95)  . Celiac disease   . Chronic diastolic CHF (congestive heart failure) ([place]    a. 06/2009 Echo: EF 60-65%, Gr 1 DD, triv AI, mildly dil LA, nl RV.  .Marland KitchenCOPD (chronic obstructive pulmonary disease) ([place]    a. Chronic bronchitis and emphysema.  . Diverticulosis   . Dysrhythmia   . Essential hypertension   . GERD (gastroesophageal reflux disease)   . History of tobacco abuse    a. Quit 2014.  .Marland KitchenPancreatitis   . PSVT (paroxysmal supraventricular tachycardia)  ([place]    a. 10/2012 Noted on [facility].  . Tubular adenoma of colon   . Type II diabetes mellitus ([facility])     Family History: Family History  Problem Relation Age of Onset  . Heart attack Mother   . Depression Mother   . Heart disease Mother   . COPD Mother   . Hypertension Mother   . Heart attack Father   . Diabetes Father   . Depression Father   . Heart disease Father   . Cirrhosis Father   . Parkinson's disease Brother     Social History: Social History   Socioeconomic History  . Marital status: Married    Spouse name: Not on file  . Number of children: 1  . Years of education: Not on file  . Highest education level: 10th grade  Occupational History  . Occupation: Disabled  Social Needs  . Financial resource strain: Not hard at all  . Food insecurity:    Worry: Never true    Inability: Never true  . Transportation needs:    Medical: No    Non-medical: No  Tobacco Use  . Smoking status: Former Smoker    Last attempt to quit: 04/22/2013    Years since quitting: 5.1  . Smokeless tobacco: Never Used  Substance and Sexual Activity  . Alcohol use: No    Comment: remotely quit alcohol use. Hx of heavy alcohol use.  . Drug use: No  . Sexual activity: Not on file  Lifestyle  . Physical activity:    Days per week: Not on file    Minutes per session: Not on file  . Stress: Only a little  Relationships  . Social connections:    Talks on phone: Not on file    Gets together: Not on file    Attends religious service: Not on file    Active member of club or organization: Not on file    Attends meetings of clubs or organizations: Not on file    Relationship status: Not on file  . Intimate partner violence:    Fear of current or ex partner: Not on file    Emotionally abused: Not on file    Physically abused: Not on file    Forced sexual activity: Not on file  Other Topics Concern  . Not on file  Social History Narrative   Pt lives  in [place] with wife.  Does  not routinely exercise.    Vital Signs: Blood pressure 123/73, pulse (!) 59, resp. rate 18, height 5' 7"  (1.702 m), weight 254 lb (115.2 kg), SpO2 95 %.  Examination: General Appearance: The patient is well-developed, well-nourished, and in no distress. Skin: Gross inspection of skin unremarkable. Head: normocephalic, no gross deformities. Eyes: no gross deformities noted. ENT: ears appear grossly normal no exudates. Neck: Supple. No thyromegaly. No LAD. Respiratory: few scattered rhonchi noted. Cardiovascular: Normal S1 and S2 without murmur or rub. Extremities: No cyanosis. pulses are equal. Neurologic: Alert and oriented. No involuntary movements.  LABS: Recent Results (from the past 2160 hour(s))  Glucose, capillary     Status: Abnormal   Collection Time: 04/01/18  8:23 AM  Result Value Ref Range   Glucose-Capillary 127 (H) 65 - 99 mg/dL  Surgical pathology     Status: None   Collection Time: 04/01/18  8:45 AM  Result Value Ref Range   SURGICAL PATHOLOGY      Surgical Pathology CASE: [id number] PATIENT: [facility] Surgical Pathology Report     SPECIMEN SUBMITTED: A. Stomach, antrum and body; cbx B. Colon polyp, transverse; cbx C. Colon polyp, transverse; cbx D. Colon polyp, descending; cbx/hot snare E. Rectum polyp x2; cbx  CLINICAL HISTORY: None provided  PRE-OPERATIVE DIAGNOSIS: EPI pain, change in BM's  POST-OPERATIVE DIAGNOSIS: Gastritis; pyloric inflammation; duodenal ulcers; diverticulosis; polyps; internal hemorrhoids     DIAGNOSIS: A.  STOMACH, ANTRUM AND BODY; COLD BIOPSY: - ANTRAL AND OXYNTIC MUCOSA WITH SUPERFICIAL VASCULAR CONGESTION. - OXYNTIC MUCOSA WITH FEATURES OF PROTON PUMP INHIBITOR EFFECT. - NEGATIVE FOR H. PYLORI, DYSPLASIA AND MALIGNANCY.  B.  COLON POLYP, TRANSVERSE; COLD BIOPSY: - NO PATHOLOGIC CHANGE. - MULTIPLE DEEPER LEVELS WERE EXAMINED.  C.  COLON POLYP, TRANSVERSE; COLD BIOPSY: - TUBULAR ADENOMA. - NEGATIVE  FOR HIGH-GRADE DYSPLASIA AND MALIGNANCY.  D.  COLON POLYP , DESCENDING; COLD BIOPSY/HOT SNARE: - POLYPOID COLONIC MUCOSA WITHOUT DYSPLASIA AND MALIGNANCY (3). - MULTIPLE DEEPER LEVELS WERE EXAMINED.  E.  RECTUM POLYP X2; COLD BIOPSY: - HYPERPLASTIC POLYPS (2). - NEGATIVE FOR DYSPLASIA AND MALIGNANCY.   GROSS DESCRIPTION: A. Labeled: Cbx gastric antrum and body Received: In formalin Tissue fragment(s): 4 Size: 0.4-0.5 cm Description: Tan fragments Entirely submitted in one cassette.  B. Labeled: Cbx transverse colon polyp Received: In formalin Tissue fragment(s): 1 Size: 0.3 cm Description: Tan fragment Entirely submitted in one cassette.  C. Labeled: Cbx transverse colon polyp Received: In formalin Tissue fragment(s): 1 Size: 0.3 cm Description: Tan fragments Entirely submitted in one cassette.  D. Labeled: Hot snare descending colon polyp Received: In formalin Tissue fragment(s): Multiple Size: Aggregate, 1.3 x 0.2 x 0.1 cm Description: Tan fragments and fecal material Entirely submitted in one cassette.   E. Labeled: Cbx rectum polyp x2 Received: In formalin Tissue fragment(s): 2 Size: 0.3 cm Description: Pink fragments Entirely submitted in one cassette.    Final Diagnosis performed by Delorse Lek, MD.   Electronically signed 04/05/2018 10:28:38AM The electronic signature indicates that the named Attending Pathologist has evaluated the specimen  Technical component performed at [facility], [address], [place], [place] [address] Lab: [phone] Dir: Rush Farmer, MD, MMM  Professional component performed at [facility], [facility], [place], [place], [place] [address] Lab: [phone] Dir: Dellia Nims. Rubinas, MD   CBC     Status: Abnormal   Collection Time: 04/18/18 10:07 AM  Result Value Ref Range   WBC 5.4 3.8 - 10.6 K/uL  RBC 5.63 4.40 - 5.90 MIL/uL   Hemoglobin 17.4 13.0 - 18.0 g/dL   HCT 51.4 40.0 - 52.0 %   MCV 91.4 80.0  - 100.0 fL   MCH 30.9 26.0 - 34.0 pg   MCHC 33.8 32.0 - 36.0 g/dL   RDW 16.1 (H) 11.5 - 14.5 %   Platelets 195 150 - 440 K/uL    Comment: Performed at [facility], [address] [place], [place] [address]  Troponin I     Status: None   Collection Time: 04/18/18 10:07 AM  Result Value Ref Range   Troponin I <0.03 <0.03 ng/mL    Comment: Performed at [facility], [address] [place], [place] [address]  Comprehensive metabolic panel     Status: Abnormal   Collection Time: 04/18/18 10:07 AM  Result Value Ref Range   Sodium 139 135 - 145 mmol/L   Potassium 4.8 3.5 - 5.1 mmol/L   Chloride 99 (L) 101 - 111 mmol/L   CO2 29 22 - 32 mmol/L   Glucose, Bld 177 (H) 65 - 99 mg/dL   BUN 16 6 - 20 mg/dL   Creatinine, Ser 1.26 (H) 0.61 - 1.24 mg/dL   Calcium 9.4 8.9 - 10.3 mg/dL   Total Protein 7.2 6.5 - 8.1 g/dL   Albumin 4.0 3.5 - 5.0 g/dL   AST 21 15 - 41 U/L   ALT 25 17 - 63 U/L   Alkaline Phosphatase 74 38 - 126 U/L   Total Bilirubin 1.2 0.3 - 1.2 mg/dL   GFR calc non Af Amer 59 (L) >60 mL/min   GFR calc Af Amer >60 >60 mL/min    Comment: (NOTE) The eGFR has been calculated using the CKD EPI equation. This calculation has not been validated in all clinical situations. eGFR's persistently <60 mL/min signify possible Chronic Kidney Disease.    Anion gap 11 5 - 15    Comment: Performed at [facility], [address] [place], [place] [address]  Glucose, capillary     Status: Abnormal   Collection Time: 04/18/18  3:44 PM  Result Value Ref Range   Glucose-Capillary 131 (H) 65 - 99 mg/dL  HIV antibody (Routine Testing)     Status: None   Collection Time: 04/18/18  4:16 PM  Result Value Ref Range   HIV Screen 4th Generation wRfx Non Reactive Non Reactive    Comment: (NOTE) Performed At: [facility] [address] [place], [place] [id number] Rush Farmer MD [id number] Performed at [facility], [address]  [place], [place] [address]   Troponin I     Status: None   Collection Time: 04/18/18  4:16 PM  Result Value Ref Range   Troponin I <0.03 <0.03 ng/mL    Comment: Performed at [facility], [address] [place], [place] [address]  Troponin I     Status: None   Collection Time: 04/18/18  9:49 PM  Result Value Ref Range   Troponin I <0.03 <0.03 ng/mL    Comment: Performed at [facility], [address] [place], [place] [address]  Troponin I     Status: None   Collection Time: 04/19/18  3:29 AM  Result Value Ref Range   Troponin I <0.03 <0.03 ng/mL    Comment: Performed at [facility], [address], [place], [place] [address]  Lipid panel     Status: Abnormal   Collection Time: 04/19/18  3:29 AM  Result Value  Ref Range   Cholesterol 125 0 - 200 mg/dL   Triglycerides 297 (H) <150 mg/dL   HDL 25 (L) >40 mg/dL   Total CHOL/HDL Ratio 5.0 RATIO   VLDL 59 (H) 0 - 40 mg/dL   LDL Cholesterol 41 0 - 99 mg/dL    Comment:        Total Cholesterol/HDL:CHD Risk Coronary Heart Disease Risk Table                     Men   Women  1/2 Average Risk   3.4   3.3  Average Risk       5.0   4.4  2 X Average Risk   9.6   7.1  3 X Average Risk  23.4   11.0        Use the calculated Patient Ratio above and the CHD Risk Table to determine the patient's CHD Risk.        ATP III CLASSIFICATION (LDL):  <100     mg/dL   Optimal  100-129  mg/dL   Near or Above                    Optimal  130-159  mg/dL   Borderline  160-189  mg/dL   High  >190     mg/dL   Very High Performed at [facility], [address] [place], [place] [address]   Basic metabolic panel     Status: Abnormal   Collection Time: 04/19/18  3:29 AM  Result Value Ref Range   Sodium 137 135 - 145 mmol/L   Potassium 3.9 3.5 - 5.1 mmol/L   Chloride 99 (L) 101 - 111 mmol/L   CO2 29 22 - 32 mmol/L   Glucose, Bld 175 (H) 65 - 99 mg/dL   BUN 18 6 - 20 mg/dL   Creatinine, Ser 1.18 0.61 - 1.24  mg/dL   Calcium 9.0 8.9 - 10.3 mg/dL   GFR calc non Af Amer >60 >60 mL/min   GFR calc Af Amer >60 >60 mL/min    Comment: (NOTE) The eGFR has been calculated using the CKD EPI equation. This calculation has not been validated in all clinical situations. eGFR's persistently <60 mL/min signify possible Chronic Kidney Disease.    Anion gap 9 5 - 15    Comment: Performed at [facility], [address] [place], [place] [address]  NM Myocar Multi W/Spect Tamela Oddi Motion / EF     Status: None   Collection Time: 04/19/18 10:59 AM  Result Value Ref Range   Rest HR 55 bpm   Rest BP 131/71 mmHg   Exercise duration (sec) 1 sec   Percent HR 47 %   Exercise duration (min) 1 min   Estimated workload 1.0 METS   Peak HR 74 bpm   Peak BP 136/70 mmHg   MPHR 156 bpm   SSS 10    SRS 7    SDS 4    TID 1.29    LV sys vol 33 mL   LV dias vol 102 62 - 150 mL  ECHOCARDIOGRAM COMPLETE     Status: None   Collection Time: 04/19/18 11:59 AM  Result Value Ref Range   Weight 3,979.2 oz   Height 67 in   BP 118/81 mmHg  POCT glycosylated hemoglobin (Hb A1C)     Status: Abnormal   Collection Time: 04/26/18  4:26 PM  Result Value Ref Range  Hemoglobin A1C 7.5 (A) 4.0 - 5.6 %   HbA1c, POC (prediabetic range)  5.7 - 6.4 %   HbA1c, POC (controlled diabetic range)  0.0 - 7.0 %   Est. average glucose Bld gHb Est-mCnc 169   CBC     Status: Abnormal   Collection Time: 04/30/18  3:40 PM  Result Value Ref Range   WBC 10.3 3.8 - 10.6 K/uL   RBC 5.79 4.40 - 5.90 MIL/uL   Hemoglobin 17.6 13.0 - 18.0 g/dL   HCT 52.7 (H) 40.0 - 52.0 %   MCV 91.1 80.0 - 100.0 fL   MCH 30.3 26.0 - 34.0 pg   MCHC 33.3 32.0 - 36.0 g/dL   RDW 15.3 (H) 11.5 - 14.5 %   Platelets 219 150 - 440 K/uL    Comment: Performed at [facility], [address] [place], [place] [address]  Troponin I     Status: None   Collection Time: 04/30/18  3:40 PM  Result Value Ref Range   Troponin I <0.03 <0.03 ng/mL    Comment:  Performed at [facility], [address] [place], [place] [address]  Comprehensive metabolic panel     Status: Abnormal   Collection Time: 04/30/18  3:40 PM  Result Value Ref Range   Sodium 137 135 - 145 mmol/L   Potassium 3.7 3.5 - 5.1 mmol/L   Chloride 91 (L) 101 - 111 mmol/L   CO2 32 22 - 32 mmol/L   Glucose, Bld 167 (H) 65 - 99 mg/dL   BUN 29 (H) 6 - 20 mg/dL   Creatinine, Ser 1.50 (H) 0.61 - 1.24 mg/dL   Calcium 10.0 8.9 - 10.3 mg/dL   Total Protein 7.6 6.5 - 8.1 g/dL   Albumin 4.2 3.5 - 5.0 g/dL   AST 22 15 - 41 U/L   ALT 25 17 - 63 U/L   Alkaline Phosphatase 77 38 - 126 U/L   Total Bilirubin 1.1 0.3 - 1.2 mg/dL   GFR calc non Af Amer 47 (L) >60 mL/min   GFR calc Af Amer 55 (L) >60 mL/min    Comment: (NOTE) The eGFR has been calculated using the CKD EPI equation. This calculation has not been validated in all clinical situations. eGFR's persistently <60 mL/min signify possible Chronic Kidney Disease.    Anion gap 14 5 - 15    Comment: Performed at [facility], [address] [place], [place] [address]  Brain natriuretic peptide     Status: None   Collection Time: 04/30/18  3:40 PM  Result Value Ref Range   B Natriuretic Peptide 28.0 0.0 - 100.0 pg/mL    Comment: Performed at [facility], [address] [place], [place] [address]  Lipase, blood     Status: None   Collection Time: 04/30/18  3:40 PM  Result Value Ref Range   Lipase 32 11 - 51 U/L    Comment: Performed at [facility], [address] [place], [place] [address]  Basic metabolic panel     Status: Abnormal   Collection Time: 05/17/18 10:37 AM  Result Value Ref Range   Sodium 140 135 - 145 mmol/L   Potassium 5.1 3.5 - 5.1 mmol/L   Chloride 102 98 - 111 mmol/L    Comment: Please note change in reference range.   CO2 31 22 - 32 mmol/L   Glucose, Bld 170 (H) 70 - 99 mg/dL    Comment: Please note change in reference  range.   BUN 17 8 - 23 mg/dL    Comment:  Please note change in reference range.   Creatinine, Ser 0.97 0.61 - 1.24 mg/dL   Calcium 8.9 8.9 - 10.3 mg/dL   GFR calc non Af Amer >60 >60 mL/min   GFR calc Af Amer >60 >60 mL/min    Comment: (NOTE) The eGFR has been calculated using the CKD EPI equation. This calculation has not been validated in all clinical situations. eGFR's persistently <60 mL/min signify possible Chronic Kidney Disease.    Anion gap 7 5 - 15    Comment: Performed at [facility], [address] [place], [place] [address]  Comprehensive metabolic panel     Status: Abnormal   Collection Time: 05/26/18  9:07 PM  Result Value Ref Range   Sodium 138 135 - 145 mmol/L   Potassium 4.3 3.5 - 5.1 mmol/L   Chloride 101 98 - 111 mmol/L    Comment: Please note change in reference range.   CO2 31 22 - 32 mmol/L   Glucose, Bld 227 (H) 70 - 99 mg/dL    Comment: Please note change in reference range.   BUN 12 8 - 23 mg/dL    Comment: Please note change in reference range.   Creatinine, Ser 1.06 0.61 - 1.24 mg/dL   Calcium 9.0 8.9 - 10.3 mg/dL   Total Protein 6.7 6.5 - 8.1 g/dL   Albumin 3.8 3.5 - 5.0 g/dL   AST 26 15 - 41 U/L   ALT 30 0 - 44 U/L    Comment: Please note change in reference range.   Alkaline Phosphatase 72 38 - 126 U/L   Total Bilirubin 0.7 0.3 - 1.2 mg/dL   GFR calc non Af Amer >60 >60 mL/min   GFR calc Af Amer >60 >60 mL/min    Comment: (NOTE) The eGFR has been calculated using the CKD EPI equation. This calculation has not been validated in all clinical situations. eGFR's persistently <60 mL/min signify possible Chronic Kidney Disease.    Anion gap 6 5 - 15    Comment: Performed at [facility], [address] [place], [place] [address]  Lipase, blood     Status: None   Collection Time: 05/26/18  9:07 PM  Result Value Ref Range   Lipase 27 11 - 51 U/L    Comment: Performed at [facility], [address] [place], [place] [address]  CBC with Differential      Status: Abnormal   Collection Time: 05/26/18  9:07 PM  Result Value Ref Range   WBC 4.6 3.8 - 10.6 K/uL   RBC 5.03 4.40 - 5.90 MIL/uL   Hemoglobin 15.7 13.0 - 18.0 g/dL   HCT 46.8 40.0 - 52.0 %   MCV 93.0 80.0 - 100.0 fL   MCH 31.2 26.0 - 34.0 pg   MCHC 33.6 32.0 - 36.0 g/dL   RDW 15.2 (H) 11.5 - 14.5 %   Platelets 180 150 - 440 K/uL   Neutrophils Relative % 50 %   Neutro Abs 2.3 1.4 - 6.5 K/uL   Lymphocytes Relative 33 %   Lymphs Abs 1.5 1.0 - 3.6 K/uL   Monocytes Relative 11 %   Monocytes Absolute 0.5 0.2 - 1.0 K/uL   Eosinophils Relative 5 %   Eosinophils Absolute 0.2 0 - 0.7 K/uL   Basophils Relative 1 %   Basophils Absolute 0.0 0 - 0.1 K/uL    Comment: Performed at [facility] [employer]  [facility], [address] [place], [place] [address]  Beta-hydroxybutyric acid     Status: None   Collection Time: 05/26/18  9:07 PM  Result Value Ref Range   Beta-Hydroxybutyric Acid 0.06 0.05 - 0.27 mmol/L    Comment: Performed at [facility], [address] [place], [place] [address]  Urinalysis, Complete w Microscopic     Status: Abnormal   Collection Time: 05/26/18  9:08 PM  Result Value Ref Range   Color, Urine STRAW (A) YELLOW   APPearance CLEAR (A) CLEAR   Specific Gravity, Urine 1.003 (L) 1.005 - 1.030   pH 5.0 5.0 - 8.0   Glucose, UA >=500 (A) NEGATIVE mg/dL   Hgb urine dipstick NEGATIVE NEGATIVE   Bilirubin Urine NEGATIVE NEGATIVE   Ketones, ur NEGATIVE NEGATIVE mg/dL   Protein, ur NEGATIVE NEGATIVE mg/dL   Nitrite NEGATIVE NEGATIVE   Leukocytes, UA NEGATIVE NEGATIVE   RBC / HPF 0-5 0 - 5 RBC/hpf   WBC, UA 0-5 0 - 5 WBC/hpf   Bacteria, UA NONE SEEN NONE SEEN   Squamous Epithelial / LPF 0-5 0 - 5    Comment: Performed at [facility], [address], [place], [place] [address]    Radiology: Scherrie Merritts  W/kub  Result Date: 05/30/2018 CLINICAL DATA:  Dysphagia. EXAM: UPPER GI SERIES WITH KUB TECHNIQUE: After obtaining a scout radiograph a routine upper GI  series was performed using thin and high density barium. FLUOROSCOPY TIME:  Fluoroscopy Time:  1 minutes 6 seconds Radiation Exposure Index (if provided by the fluoroscopic device): 28.9 mGy Number of Acquired Spot Images: 0 COMPARISON:  None. FINDINGS: Examination of the esophagus demonstrated normal esophageal motility. Normal esophageal morphology without evidence of esophagitis or ulceration. No esophageal stricture diverticula, or mass lesion. Small hiatal hernia. Moderate-severe gastroesophageal reflux extending to the cervical esophagus. Examination of the stomach demonstrated mild thickening of the rugal folds and areae gastricae as can be seen with mild gastritis. The gastric mucosa appeared unremarkable without evidence of ulceration, scarring, or mass lesion. Gastric motility and emptying was normal. Fluoroscopic examination of the duodenum demonstrates normal motility and morphology without evidence of ulceration or mass lesion. IMPRESSION: 1. Small hiatal hernia. 2. Moderate-severe gastroesophageal reflux extending to the level of the cervical esophagus. 3. Mild gastric fold thickening as can be seen with mild gastritis. Electronically Signed   By: Kathreen Devoid   On: 05/30/2018 10:35    No results found.  Ct Abdomen Pelvis W Contrast  Result Date: 05/26/2018 CLINICAL DATA:  62 y/o M; lower abdominal pain with emesis over the past 24 hours. EXAM: CT ABDOMEN AND PELVIS WITH CONTRAST TECHNIQUE: Multidetector CT imaging of the abdomen and pelvis was performed using the standard protocol following bolus administration of intravenous contrast. CONTRAST:  129m ISOVUE-300 IOPAMIDOL (ISOVUE-300) INJECTION 61% COMPARISON:  10/27/2017 CT abdomen and pelvis. FINDINGS: Lower chest: No acute abnormality. Hepatobiliary: Hepatic steatosis. No focal liver abnormality is seen. Status post cholecystectomy. No biliary dilatation. Pancreas: Unremarkable. No pancreatic ductal dilatation or surrounding inflammatory  changes. Spleen: Normal in size without focal abnormality. Adrenals/Urinary Tract: Stable adrenal nodular hypertrophy. Kidneys are normal, without renal calculi, focal lesion, or hydronephrosis. Bladder is unremarkable. Stomach/Bowel: Mild distal esophageal dilatation and wall thickening. Debris within the lower esophagus. Normal appearance of the stomach. Small stable superiorly directed duodenum diverticulum arising from the third segment of duodenum. No obstructive or inflammatory changes of jejunum, ileum, or colon. Mild sigmoid diverticulosis, no findings of acute diverticulitis. Vascular/Lymphatic: Aortic  atherosclerosis. No enlarged abdominal or pelvic lymph nodes. Reproductive: Prostate is unremarkable. Other: Stable small left inguinal hernia containing fat. Musculoskeletal: No fracture is seen. Mild lumbar spondylosis with prominent facet arthropathy. IMPRESSION: 1. Mild distention of the distal esophagus containing debris. Stable mild wall thickening of distal esophagus. 2. Hepatic steatosis. 3. Stable adrenal nodular hypertrophy. 4. Aortic atherosclerosis. 5. Stable small left inguinal hernia containing fat. Electronically Signed   By: Kristine Garbe M.D.   On: 05/26/2018 22:53   Dg Duanne Limerick  W/kub  Result Date: 05/30/2018 CLINICAL DATA:  Dysphagia. EXAM: UPPER GI SERIES WITH KUB TECHNIQUE: After obtaining a scout radiograph a routine upper GI series was performed using thin and high density barium. FLUOROSCOPY TIME:  Fluoroscopy Time:  1 minutes 6 seconds Radiation Exposure Index (if provided by the fluoroscopic device): 28.9 mGy Number of Acquired Spot Images: 0 COMPARISON:  None. FINDINGS: Examination of the esophagus demonstrated normal esophageal motility. Normal esophageal morphology without evidence of esophagitis or ulceration. No esophageal stricture diverticula, or mass lesion. Small hiatal hernia. Moderate-severe gastroesophageal reflux extending to the cervical esophagus. Examination  of the stomach demonstrated mild thickening of the rugal folds and areae gastricae as can be seen with mild gastritis. The gastric mucosa appeared unremarkable without evidence of ulceration, scarring, or mass lesion. Gastric motility and emptying was normal. Fluoroscopic examination of the duodenum demonstrates normal motility and morphology without evidence of ulceration or mass lesion. IMPRESSION: 1. Small hiatal hernia. 2. Moderate-severe gastroesophageal reflux extending to the level of the cervical esophagus. 3. Mild gastric fold thickening as can be seen with mild gastritis. Electronically Signed   By: Kathreen Devoid   On: 05/30/2018 10:35      Assessment and Plan: Patient Active Problem List   Diagnosis Date Noted  . Acute on chronic heart failure ([place]) 04/18/2018  . History of adenomatous polyp of colon 04/05/2018  . Abdominal pain, chronic, epigastric 11/06/2017  . Bilateral flank pain 03/24/2017  . Dyspnea 04/03/2016  . Hypotension 04/03/2016  . Chronic kidney disease 04/03/2016  . Anemia 04/03/2016  . Paroxysmal atrial fibrillation ([place]) 12/23/2015  . OSA (obstructive sleep apnea) 12/10/2015  . Left inguinal hernia 11/07/2015  . Anxiety 11/07/2015  . Unstable angina ([place]) 10/05/2015  . Back pain with left-sided radiculopathy 09/30/2015  . Nocturnal hypoxia 09/06/2015  . BPH (benign prostatic hyperplasia) 08/01/2015  . Chronic diastolic CHF (congestive heart failure) ([place])   . Angina pectoris ([place])   . Chest pain 07/11/2015  . COPD (chronic obstructive pulmonary disease) ([place]) 07/03/2015  . CAD (coronary artery disease) 06/26/2015  . HTN (hypertension) 06/26/2015  . DM (diabetes mellitus) ([place]) 06/26/2015  . Achalasia 07/24/2014  . GERD (gastroesophageal reflux disease) 06/07/2014  . Former tobacco use 04/11/2013  . HLD (hyperlipidemia) 04/09/2013    1. COPD controlled at this time patient's FEV1 was 70% as mentioned.  We will continue with current management.  Patient  is on Breo as well as albuterol and Spiriva but this will be continued at the current doses. 2. Chronic Respiratory failure with hypoxia on oxygen therapy. He state that the oxygen does help him withhis activity level. Patient notes he gets more SOB if not using the oxygen.  I checked is resting in oximetry and his resting oxygen to show saturations of 89%.  I am going to go ahead and order a overnight oximetry in addition to a ambulatory pulse ox 3. CAD/CHF/Atrial fib sees Dr [name] will continue. Currently is rate controlled. Echo done reveals preserved EF.  Patient states that he has had normal rhythym also  General Counseling: I have discussed the findings of the evaluation and examination with Kylon.  I have also discussed any further diagnostic evaluation thatmay be needed or ordered today. Morrill verbalizes understanding of the findings of todays visit. We also reviewed his medications today and discussed drug interactions and side effects including but not limited excessive drowsiness and altered mental states. We also discussed that there is always a risk not just to him but also people around him. he has been encouraged to call the office with any questions or concerns that should arise related to todays visit.    Time spent: 65mn  I have personally obtained a history, examined the patient, evaluated laboratory and imaging results, formulated the assessment and plan and placed orders.    SAllyne Gee MD [facility]

## 2018-06-20 NOTE — Patient Instructions (Signed)

## 2018-06-22 ENCOUNTER — Telehealth: Payer: Self-pay | Admitting: Internal Medicine

## 2018-06-22 NOTE — Telephone Encounter (Signed)
Faxed and put overnight oximetry test with [facility]. Beth

## 2018-06-24 ENCOUNTER — Other Ambulatory Visit: Payer: Self-pay | Admitting: Family Medicine

## 2018-07-04 ENCOUNTER — Ambulatory Visit: Payer: Self-pay | Admitting: Family Medicine

## 2018-07-04 DIAGNOSIS — E782 Mixed hyperlipidemia: Secondary | ICD-10-CM | POA: Diagnosis not present

## 2018-07-04 DIAGNOSIS — I214 Non-ST elevation (NSTEMI) myocardial infarction: Secondary | ICD-10-CM | POA: Diagnosis not present

## 2018-07-04 DIAGNOSIS — E119 Type 2 diabetes mellitus without complications: Secondary | ICD-10-CM | POA: Diagnosis not present

## 2018-07-04 DIAGNOSIS — I10 Essential (primary) hypertension: Secondary | ICD-10-CM | POA: Diagnosis not present

## 2018-07-04 DIAGNOSIS — I2510 Atherosclerotic heart disease of native coronary artery without angina pectoris: Secondary | ICD-10-CM | POA: Diagnosis not present

## 2018-07-04 DIAGNOSIS — I480 Paroxysmal atrial fibrillation: Secondary | ICD-10-CM | POA: Diagnosis not present

## 2018-07-07 IMAGING — CR DG CHEST 2V
1 series · 2 of 2 positions shown · non-contrast
Comparison: 07/22/2016, 07/14/2016, 06/24/2016

CLINICAL DATA: 60-year-old male with a history of bilateral flank
pain for 2 days

EXAM:
CHEST  2 VIEW

[Series 1: dg chest 2 view · 0.14mm/px · 2 of 2 slices shown]
[im 1/2]
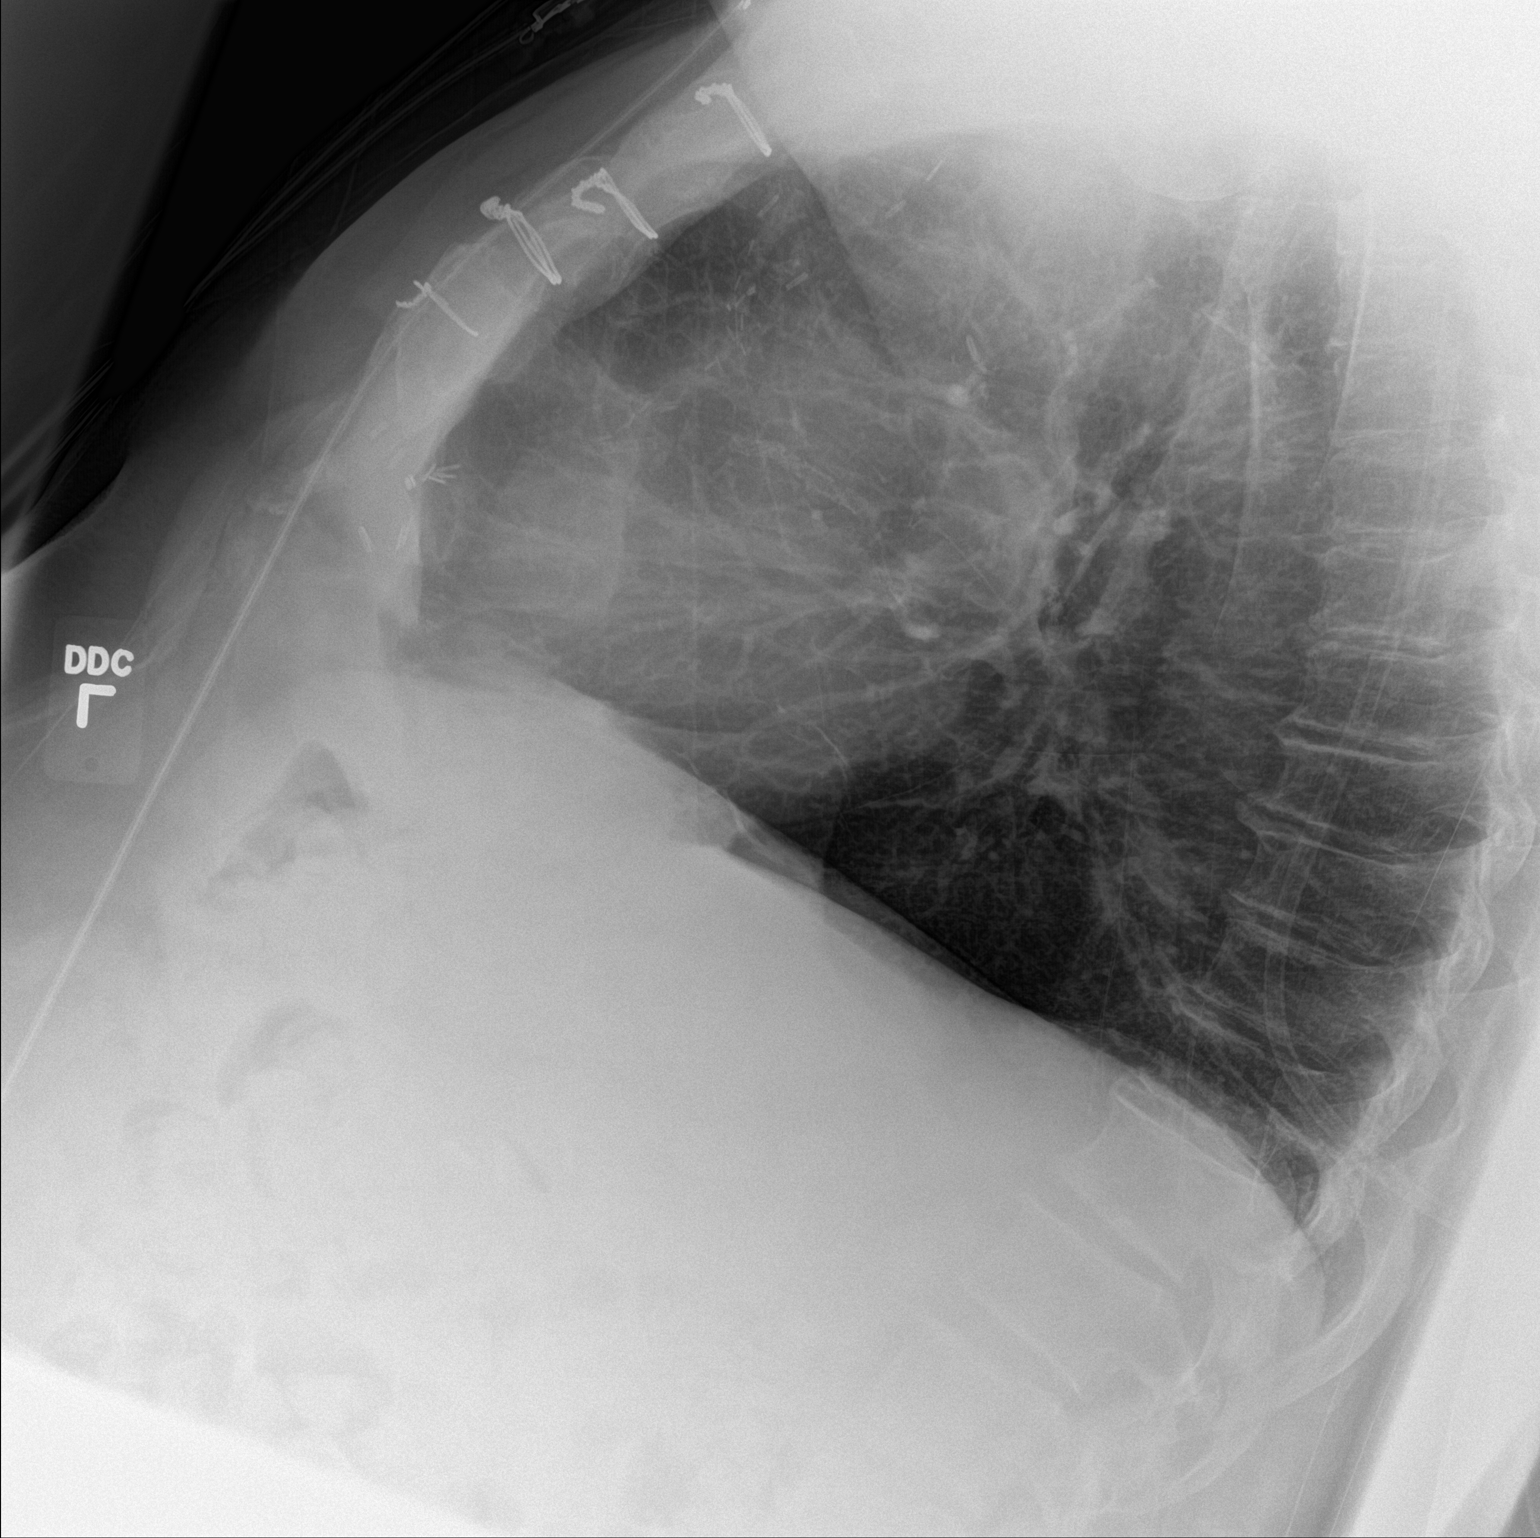
[im 2/2]
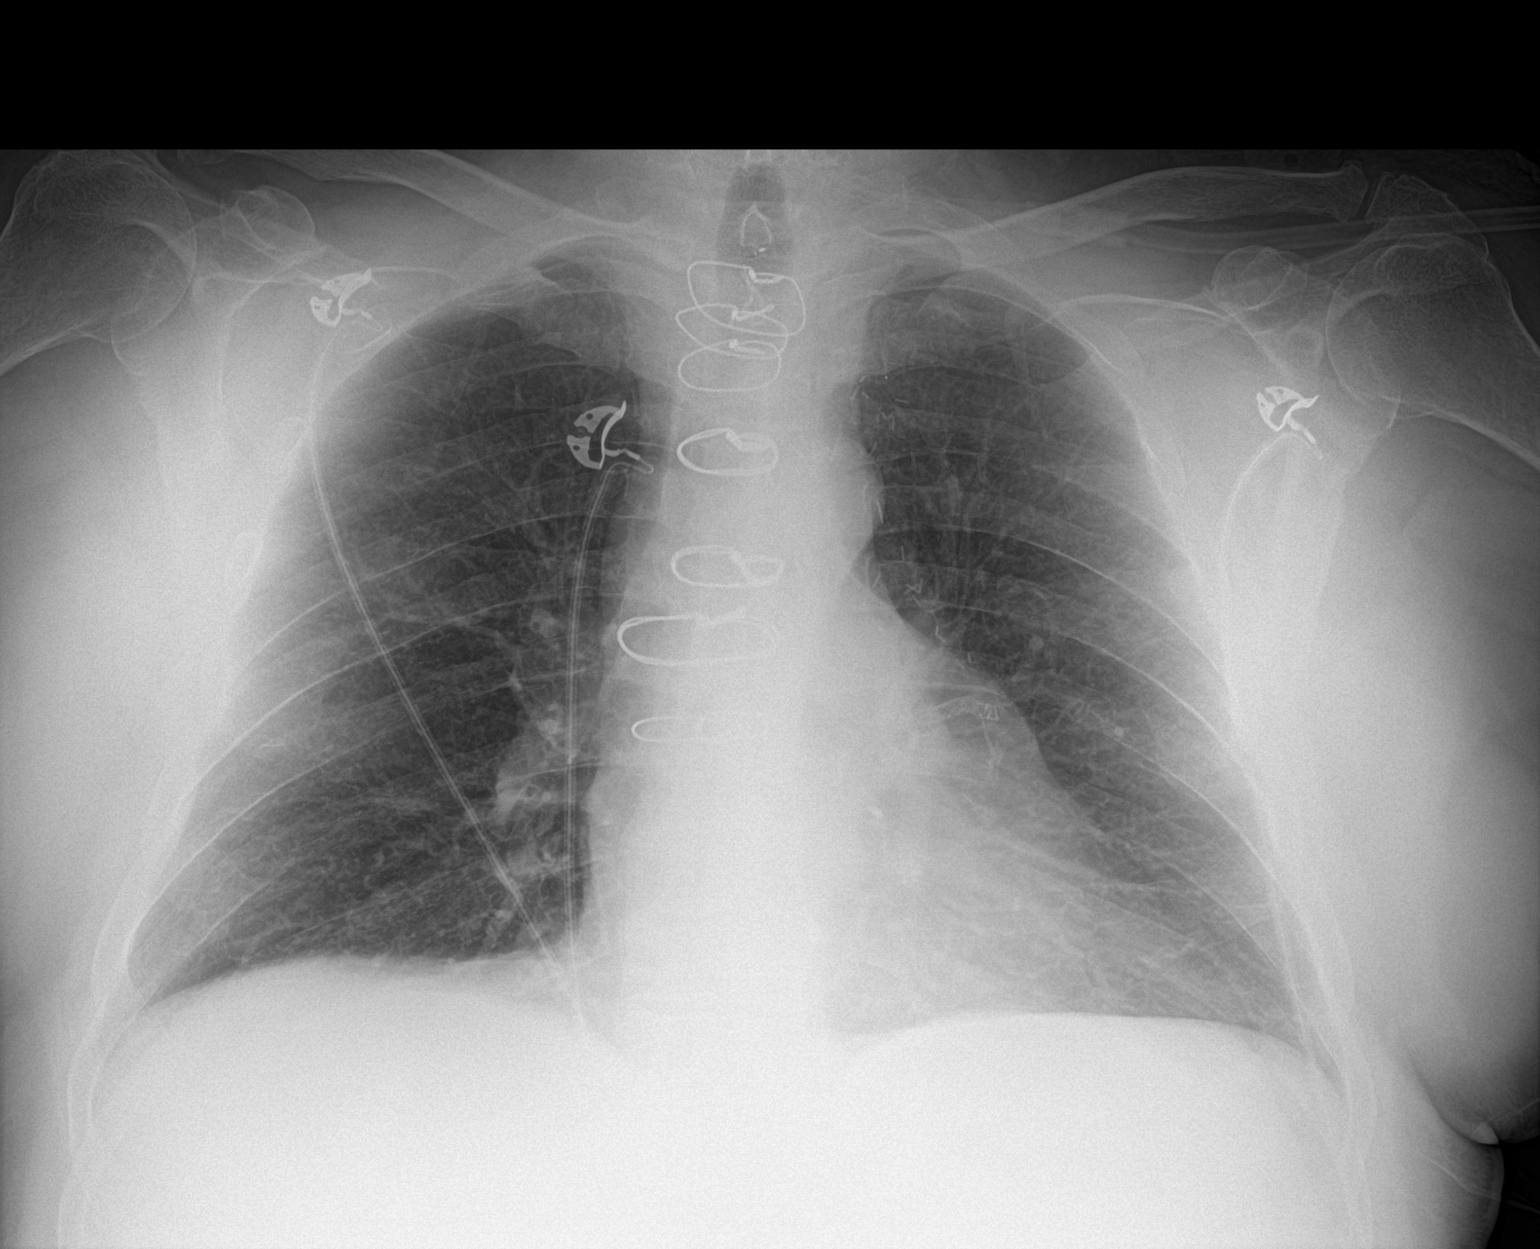

[2 of 2 positions shown; findings below may reference images not displayed]

FINDINGS: Cardiomediastinal silhouette unchanged in size and contour. Surgical
changes of prior median sternotomy and CABG. Coronary stents
visible.

Coarsened interstitial markings of the lungs. No pneumothorax or
pleural effusion. No confluent airspace disease.

Degenerative changes of the spine.  No displaced fracture.
IMPRESSION: Chronic lung changes without evidence of superimposed acute
cardiopulmonary disease.

Surgical changes of prior median sternotomy and CABG. Coronary
stents in place

## 2018-07-11 ENCOUNTER — Other Ambulatory Visit: Payer: Self-pay | Admitting: Family Medicine

## 2018-07-12 DIAGNOSIS — J449 Chronic obstructive pulmonary disease, unspecified: Secondary | ICD-10-CM | POA: Diagnosis not present

## 2018-07-12 DIAGNOSIS — I10 Essential (primary) hypertension: Secondary | ICD-10-CM | POA: Diagnosis not present

## 2018-07-12 DIAGNOSIS — G4710 Hypersomnia, unspecified: Secondary | ICD-10-CM | POA: Diagnosis not present

## 2018-07-12 DIAGNOSIS — J439 Emphysema, unspecified: Secondary | ICD-10-CM | POA: Diagnosis not present

## 2018-07-14 ENCOUNTER — Other Ambulatory Visit: Payer: Self-pay | Admitting: Family Medicine

## 2018-07-14 DIAGNOSIS — M5410 Radiculopathy, site unspecified: Secondary | ICD-10-CM

## 2018-07-14 NOTE — Telephone Encounter (Signed)
Pt called for a status update on his refill request. Pt was advised refill request can take 24 to 48 hours. Pt stated that he just wanted to make it gets sent in before we close for the holiday. Please advise. Thanks TNP

## 2018-07-14 NOTE — Telephone Encounter (Signed)
Pt contacted office for refill request on the following medications:  oxyCODONE-acetaminophen (PERCOCET) 10-325 MG tablet   [facility]  Last Rx: 06/15/18 LOV: 06/15/18 Please advise. Thanks TNP

## 2018-07-15 IMAGING — CR DG CHEST 2V
2 series · 2 of 2 positions shown · non-contrast
Comparison: August 25, 2016

CLINICAL DATA: Right-sided chest pain

EXAM:
CHEST  2 VIEW

[chest pa]
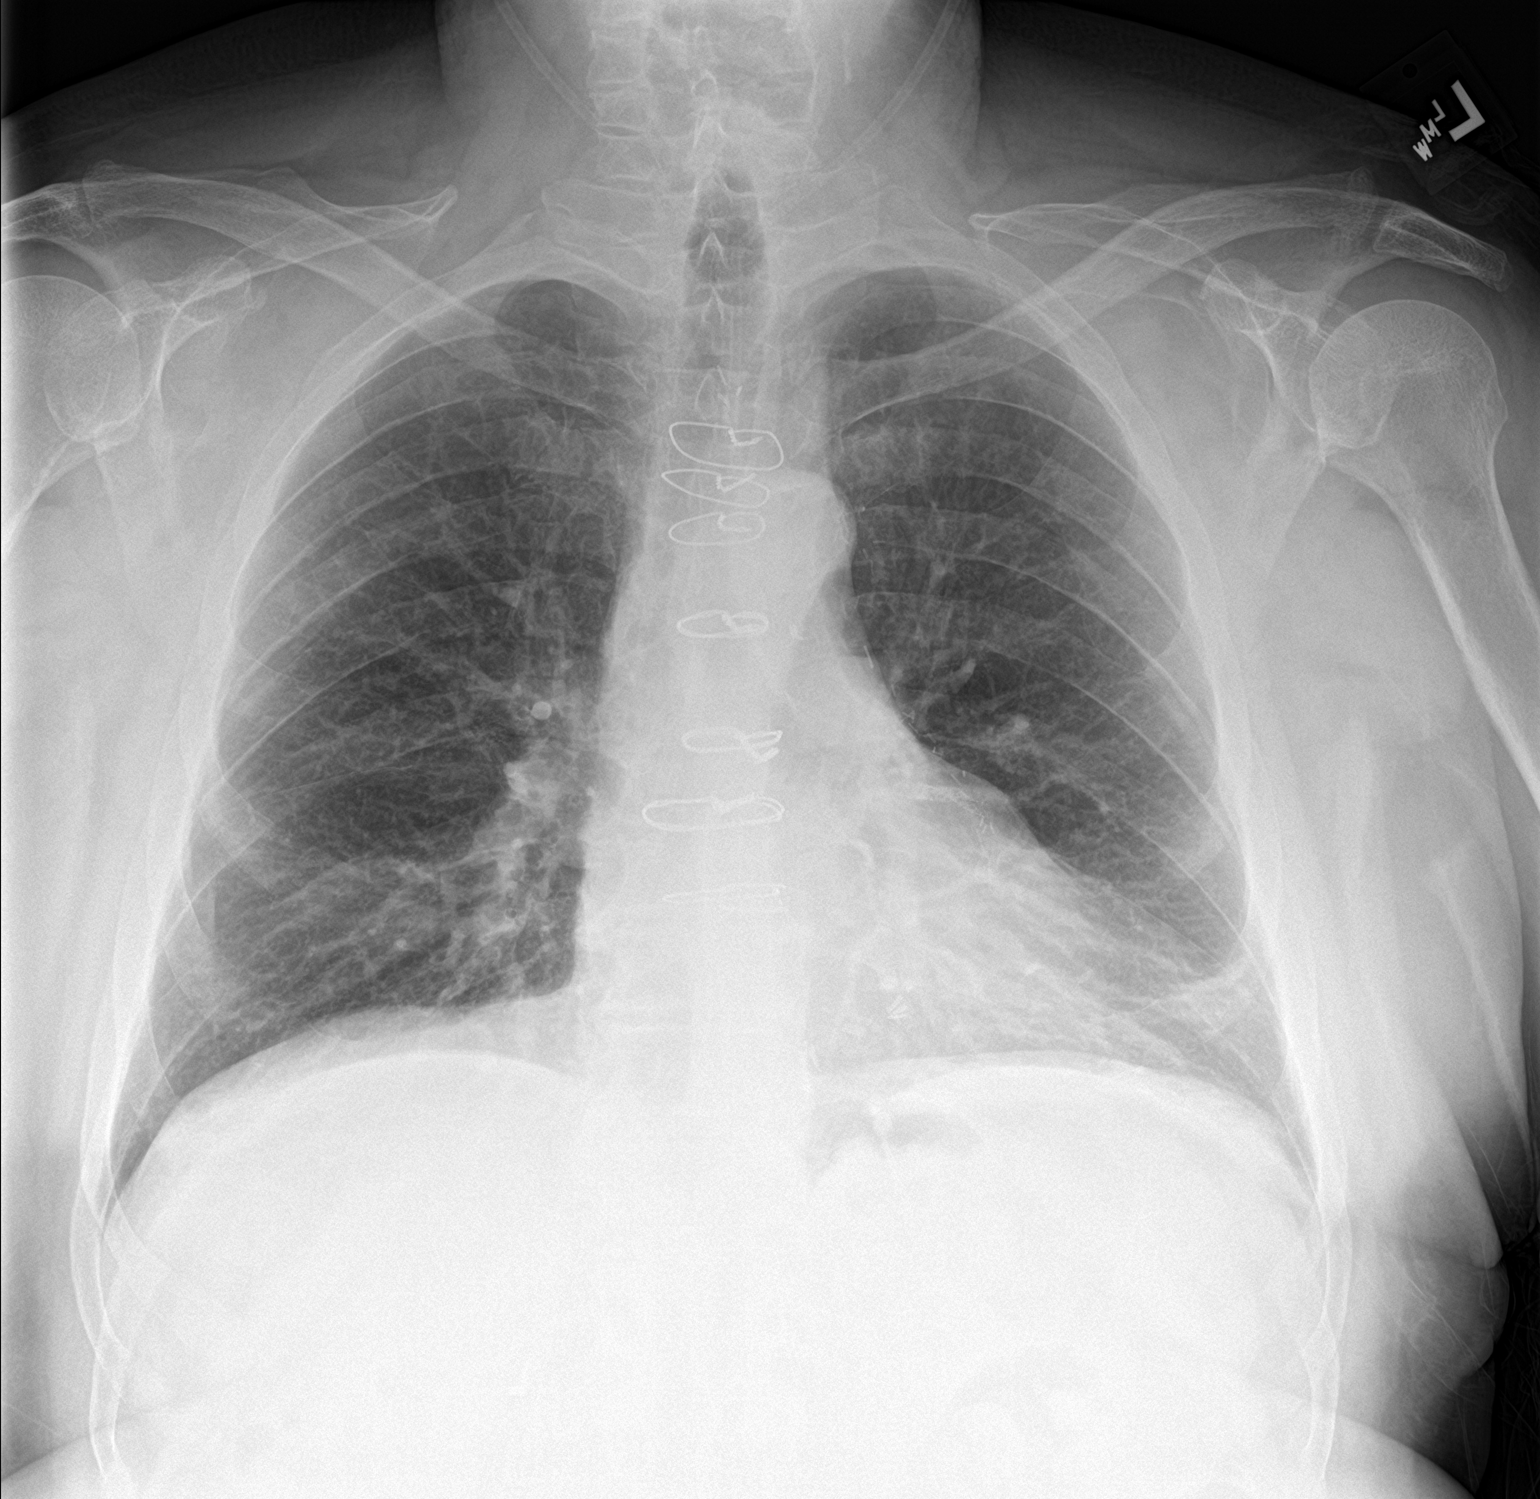

[chest lat]
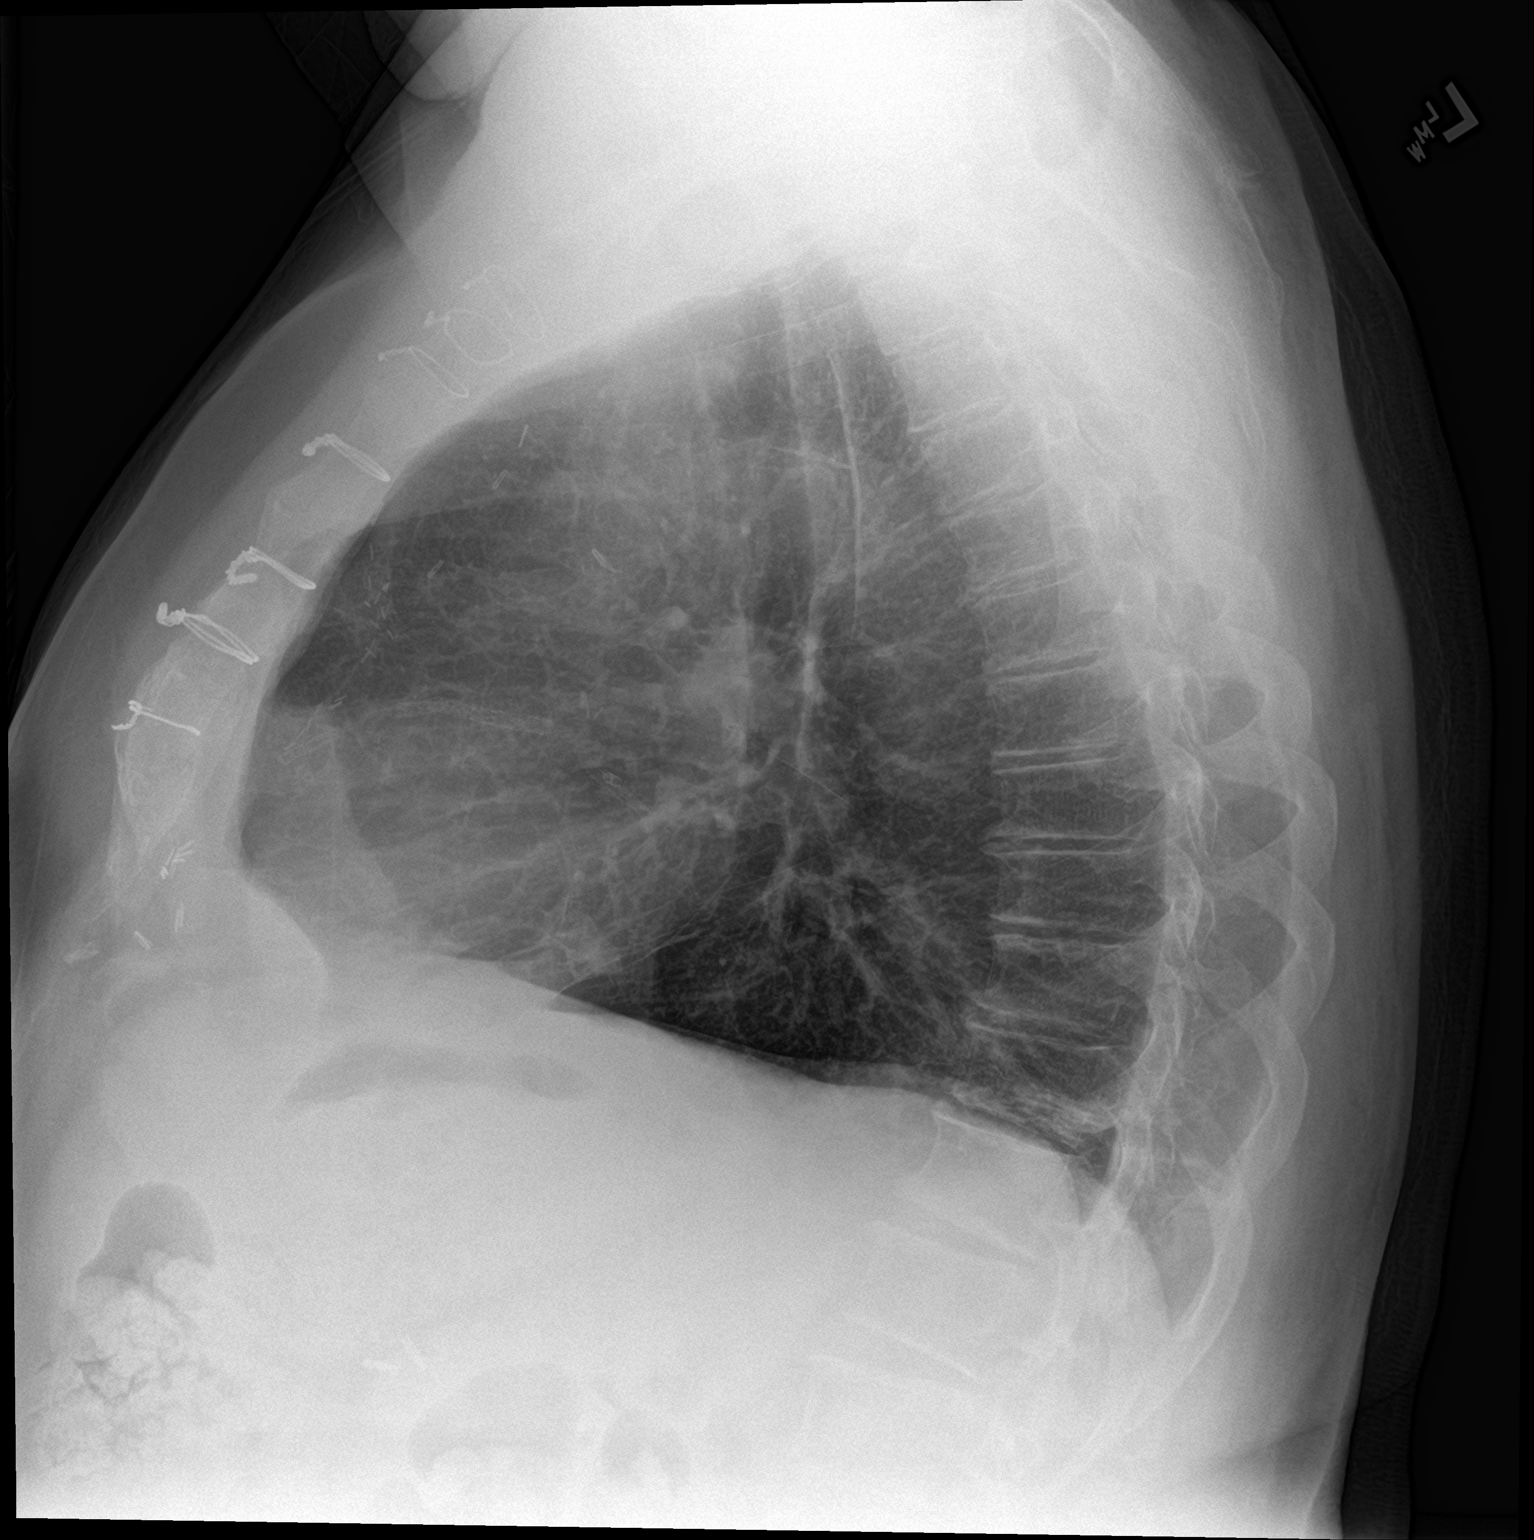

[2 of 2 positions shown; findings below may reference images not displayed]

FINDINGS: There is no edema or consolidation. The heart size and pulmonary
vascularity are normal. No adenopathy. There is atherosclerotic
calcification in the aorta. Patient is status post internal mammary
bypass grafting. There is degenerative change in the thoracic spine.
There is calcification in the left carotid artery.
IMPRESSION: No edema or consolidation. Aortic atherosclerosis noted. There is
also calcification in the left carotid artery.

## 2018-07-15 MED ORDER — OXYCODONE-ACETAMINOPHEN 10-325 MG PO TABS: 1.0000 | ORAL_TABLET | ORAL | 0 refills | Status: DC | PRN

## 2018-07-21 ENCOUNTER — Telehealth: Payer: Self-pay

## 2018-07-21 ENCOUNTER — Other Ambulatory Visit: Payer: Self-pay | Admitting: Family Medicine

## 2018-07-21 MED ORDER — ALLOPURINOL 100 MG PO TABS: 300.0000 mg | ORAL_TABLET | Freq: Every day | ORAL | 4 refills | Status: DC

## 2018-07-21 NOTE — Telephone Encounter (Signed)
Patient needs refill on Allopurinol 100 mg. Sent to [facility]

## 2018-07-21 NOTE — Telephone Encounter (Signed)
Lawrence [name] from [facility] called to confirm Allopurinol 100 mg. Take 3 tablets twice a day. sd

## 2018-07-26 ENCOUNTER — Telehealth: Payer: Self-pay | Admitting: Family Medicine

## 2018-07-26 NOTE — Telephone Encounter (Signed)
Called patient back for more information. Patient states he has had diarrhea and nausea for 3 days. Patient states diarrhea is pretty constant, having bm every 10 minutes. Patient states he feels flushed and does have mild stomach cramps. Patient scheduled ov for tomorrow at 1:40 pm. However patient would like to know what he should do to control diarrhea until he can be seen? Please advise?

## 2018-07-26 NOTE — Telephone Encounter (Signed)
Pt called to report that he has had diarrhea three days  Please advise as what he needs to do  Pt's CB#  [phone]  Thanks  teri

## 2018-07-26 NOTE — Telephone Encounter (Signed)
Patient was advise.

## 2018-07-26 NOTE — Telephone Encounter (Signed)
He can take OTC imodium. Only eat bland foods. No dairy. No fruit juices.

## 2018-07-27 ENCOUNTER — Ambulatory Visit (INDEPENDENT_AMBULATORY_CARE_PROVIDER_SITE_OTHER): Payer: Medicare HMO | Admitting: Family Medicine

## 2018-07-27 ENCOUNTER — Encounter: Payer: Self-pay | Admitting: Family Medicine

## 2018-07-27 VITALS — BP 134/72 | HR 62 | Temp 97.6°F | Wt 247.0 lb

## 2018-07-27 DIAGNOSIS — J410 Simple chronic bronchitis: Secondary | ICD-10-CM | POA: Diagnosis not present

## 2018-07-27 DIAGNOSIS — E785 Hyperlipidemia, unspecified: Secondary | ICD-10-CM

## 2018-07-27 DIAGNOSIS — E1159 Type 2 diabetes mellitus with other circulatory complications: Secondary | ICD-10-CM | POA: Diagnosis not present

## 2018-07-27 DIAGNOSIS — I25118 Atherosclerotic heart disease of native coronary artery with other forms of angina pectoris: Secondary | ICD-10-CM | POA: Diagnosis not present

## 2018-07-27 DIAGNOSIS — R197 Diarrhea, unspecified: Secondary | ICD-10-CM | POA: Diagnosis not present

## 2018-07-27 DIAGNOSIS — I10 Essential (primary) hypertension: Secondary | ICD-10-CM

## 2018-07-27 DIAGNOSIS — Z23 Encounter for immunization: Secondary | ICD-10-CM | POA: Diagnosis not present

## 2018-07-27 LAB — POCT GLYCOSYLATED HEMOGLOBIN (HGB A1C): Hemoglobin A1C: 7 % — AB (ref 4.0–5.6)

## 2018-07-27 NOTE — Patient Instructions (Signed)
Call if diarrhea does not steadily improve and go away within the next 5 days.

## 2018-07-27 NOTE — Progress Notes (Signed)
Patient: Lawrence [name] Male    DOB: [birth year]   62 y.o.   MRN: [id number] Visit Date: 07/27/2018  Today's Provider: [name], MD   Chief Complaint  Patient presents with  . Diabetes  . Hyperlipidemia  . Hypertension  . Diarrhea    Started about three days ago   Subjective:    Diarrhea   This is a new problem. The current episode started in the past 7 days. The problem occurs more than 10 times per day. The problem has been gradually improving. Associated symptoms include abdominal pain, coughing and sweats. Pertinent negatives include no chills, fever, headaches, URI, vomiting or weight loss. Risk factors include ill contacts. He has tried anti-motility drug and change of diet for the symptoms. The treatment provided mild relief.  He called the office yesterday and was advised to try OTC imodium which he states has helped quite a bit. States similar sx have been going around the home.     Diabetes Mellitus Type II, Follow-up:   Lab Results  Component Value Date   HGBA1C 7.5 (A) 04/26/2018   HGBA1C 6.8 (H) 01/10/2018   HGBA1C 6.1 08/31/2017   Last seen for diabetes 3 months ago.  Management since then includes Increased Metformin 511m twice a day (06/15/2018). He reports excellent compliance with treatment. He is not having side effects.  Current symptoms include none and have been stable. Home blood sugar records: fasting range: 130's  Episodes of hypoglycemia? no    Most Recent Eye Exam: UTD Weight trend: stable Prior visit with dietician: no Current diet: in general, a "healthy" diet   Current exercise: occasionally  ------------------------------------------------------------------------   Hypertension, follow-up:  BP Readings from Last 3 Encounters:  07/27/18 134/72  06/20/18 123/73  06/15/18 138/86    He was last seen for hypertension 3 months ago.  BP at that visit was 122/70. Management since that visit includes None.He reports  excellent compliance with treatment. He is not having side effects.  He is exercising. He is adherent to low salt diet.   Outside blood pressures are:  Pt does not check his blood pressure. He is experiencing none.  Patient denies chest pain, fatigue, lower extremity edema, near-syncope and syncope.   Cardiovascular risk factors include none.  Use of agents associated with hypertension: none.   ------------------------------------------------------------------------    Lipid/Cholesterol, Follow-up:   Last seen for this 3 months ago.  Management since that visit includes none.  Last Lipid Panel:    Component Value Date/Time   CHOL 125 04/19/2018 0329   CHOL 151 01/10/2018 1041   CHOL 92 05/23/2014 0415   TRIG 297 (H) 04/19/2018 0329   TRIG 109 05/23/2014 0415   HDL 25 (L) 04/19/2018 0329   HDL 38 (L) 01/10/2018 1041   HDL 28 (L) 05/23/2014 0415   CHOLHDL 5.0 04/19/2018 0329   VLDL 59 (H) 04/19/2018 0329   VLDL 22 05/23/2014 0415   LDLCALC 41 04/19/2018 0329   LDLCALC 56 01/10/2018 1041   LDLCALC 42 05/23/2014 0415    He reports excellent compliance with treatment. He is not having side effects.   Wt Readings from Last 3 Encounters:  07/27/18 247 lb (112 kg)  06/20/18 254 lb (115.2 kg)  06/15/18 248 lb (112.5 kg)    ------------------------------------------------------------------------  He states that since his last hospitalization in July he has has very little trouble with chest pains. Is tolerating Ranexa well. Has since had follow up with PA at  [facility] cardiology.   He also reports his breathing has been pretty good lately. Is using inhalers consistently. Is not smoking. Is followed by Dr. Humphrey Rolls who last visited with at the beginning of August.     Allergies  Allergen Reactions  . Demerol [Meperidine] Hives  . Jardiance [Empagliflozin]     Perineal pain  . Prednisone Other (See Comments) and Hypertension    Pt states that this medication puts him in A-fib     . Sulfa Antibiotics Hives  . Albuterol Sulfate [Albuterol] Palpitations and Other (See Comments)    Pt currently uses this medication.    . Morphine Sulfate Nausea And Vomiting, Rash and Other (See Comments)    Pt states that he is only allergic to the tablet form of this medication.       Current Outpatient Medications:  .  albuterol (PROVENTIL HFA;VENTOLIN HFA) 108 (90 Base) MCG/ACT inhaler, Inhale 2 puffs into the lungs every 4 (four) hours as needed for wheezing or shortness of breath. Reported on 04/08/2016, Disp: 1 Inhaler, Rfl: 5 .  albuterol (PROVENTIL) (2.5 MG/3ML) 0.083% nebulizer solution, Take 3 mLs (2.5 mg total) by nebulization every 6 (six) hours as needed for wheezing or shortness of breath., Disp: 150 mL, Rfl: 1 .  allopurinol (ZYLOPRIM) 100 MG tablet, TAKE 3 TABLETS BY MOUTH TWICE A DAY., Disp: 90 tablet, Rfl: 4 .  ALPRAZolam (XANAX) 1 MG tablet, TAKE ONE-HALF TO ONE TABLET BY MOUTH THREE TIMES A DAY AS NEEDED, Disp: 90 tablet, Rfl: 5 .  aspirin 81 MG chewable tablet, Chew 81 mg by mouth daily., Disp: , Rfl:  .  atorvastatin (LIPITOR) 80 MG tablet, TAKE ONE TABLET BY MOUTH EVERY NIGHT AT BEDTIME, Disp: 90 tablet, Rfl: 4 .  Blood Glucose Monitoring Suppl (ACCU-CHEK AVIVA PLUS) w/Device KIT, CHECK BLOOD SUGAR EVERY DAY, Disp: 1 kit, Rfl: 0 .  cetirizine (ZYRTEC) 10 MG tablet, TAKE ONE TABLET AT BEDTIME, Disp: 30 tablet, Rfl: 11 .  cyclobenzaprine (FLEXERIL) 5 MG tablet, TAKE 1 TO 2 TABLETS BY MOUTH THREE TIMESDAILY AS NEEDED (BACK PAIN), Disp: 60 tablet, Rfl: 5 .  ELIQUIS 5 MG TABS tablet, TAKE 1 TABLET BY MOUTH TWICE A DAY., Disp: 60 tablet, Rfl: 12 .  fluticasone furoate-vilanterol (BREO ELLIPTA) 100-25 MCG/INH AEPB, Inhale 1 puff into the lungs daily., Disp: 1 each, Rfl: 2 .  glucose blood (ACCU-CHEK AVIVA PLUS) test strip, Use to check blood sugar once a day, Disp: 100 each, Rfl: 4 .  isosorbide mononitrate (IMDUR) 60 MG 24 hr tablet, Take 1 tablet (60 mg total) by mouth  daily. (Patient taking differently: Take 60 mg by mouth 2 (two) times daily. ), Disp: 60 tablet, Rfl: 1 .  LINZESS 72 MCG capsule, Take 72 mcg by mouth daily., Disp: , Rfl:  .  lisinopril (PRINIVIL,ZESTRIL) 2.5 MG tablet, TAKE 1 TABLET BY MOUTH EVERY DAY, Disp: 30 tablet, Rfl: 5 .  magnesium oxide (MAG-OX) 400 (241.3 Mg) MG tablet, TAKE 1 TABLET BY MOUTH EVERY DAY, Disp: 30 tablet, Rfl: 5 .  metFORMIN (GLUCOPHAGE) 500 MG tablet, Take 1 tablet (500 mg total) by mouth 2 (two) times daily with a meal., Disp: 60 tablet, Rfl: 11 .  nitroGLYCERIN (NITROSTAT) 0.4 MG SL tablet, Place 1 tablet (0.4 mg total) under the tongue every 5 (five) minutes as needed for chest pain. Reported on 05/26/2016, Disp: 30 tablet, Rfl: 1 .  omega-3 acid ethyl esters (LOVAZA) 1 g capsule, TAKE FOUR CAPSULES BY MOUTH DAILY, Disp: 120 capsule,  Rfl: 12 .  omeprazole (PRILOSEC) 40 MG capsule, Take 1 capsule by mouth 2 (two) times daily., Disp: , Rfl:  .  ondansetron (ZOFRAN ODT) 4 MG disintegrating tablet, Take 1 tablet (4 mg total) by mouth every 8 (eight) hours as needed for nausea or vomiting., Disp: 20 tablet, Rfl: 1 .  oxyCODONE-acetaminophen (PERCOCET) 10-325 MG tablet, Take 1 tablet by mouth every 4 (four) hours as needed., Disp: 150 tablet, Rfl: 0 .  OXYGEN, Inhale 2 L into the lungs. Wears ATC and at night, Disp: , Rfl:  .  potassium chloride (K-DUR,KLOR-CON) 10 MEQ tablet, Take by mouth., Disp: , Rfl:  .  RANEXA 500 MG 12 hr tablet, Take 1,000 mg by mouth 2 (two) times daily., Disp: , Rfl:  .  sotalol (BETAPACE) 120 MG tablet, Take 120 mg by mouth 2 (two) times daily. , Disp: , Rfl:  .  SPIRIVA HANDIHALER 18 MCG inhalation capsule, INHALE ONE CAPSULE AS DIRECTED ONCE A DAY, Disp: 30 capsule, Rfl: 12 .  tamsulosin (FLOMAX) 0.4 MG CAPS capsule, Take 0.4 mg by mouth daily. , Disp: , Rfl:  .  torsemide (DEMADEX) 100 MG tablet, Take 0.5 tablets by mouth daily., Disp: , Rfl:  .  docusate sodium (COLACE) 100 MG capsule, Take 100  mg by mouth daily. , Disp: , Rfl:  .  empagliflozin (JARDIANCE) 10 MG TABS tablet, Take by mouth., Disp: , Rfl:   Review of Systems  Constitutional: Positive for diaphoresis. Negative for activity change, appetite change, chills, fatigue, fever, unexpected weight change and weight loss.  HENT: Positive for sneezing. Negative for congestion, ear discharge, ear pain, postnasal drip, sinus pressure, sinus pain, sore throat, trouble swallowing and voice change.   Eyes: Negative.   Respiratory: Positive for cough. Negative for apnea, choking, chest tightness, shortness of breath and wheezing.   Gastrointestinal: Positive for abdominal pain, diarrhea and nausea. Negative for abdominal distention, anal bleeding, blood in stool, constipation, rectal pain and vomiting.  Neurological: Negative for headaches.    Social History   Tobacco Use  . Smoking status: Former Smoker    Last attempt to quit: 04/22/2013    Years since quitting: 5.2  . Smokeless tobacco: Never Used  Substance Use Topics  . Alcohol use: No    Comment: remotely quit alcohol use. Hx of heavy alcohol use.   Objective:   BP 134/72 (BP Location: Right Arm, Patient Position: Sitting, Cuff Size: Large)   Pulse 62   Temp 97.6 F (36.4 C) (Oral)   Wt 247 lb (112 kg)   SpO2 97% Comment: 2% O2  BMI 38.69 kg/m  Vitals:   07/27/18 1339  BP: 134/72  Pulse: 62  Temp: 97.6 F (36.4 C)  TempSrc: Oral  SpO2: 97%  Weight: 247 lb (112 kg)     Physical Exam   General Appearance:    Alert, cooperative, no distress  Eyes:    PERRL, conjunctiva/corneas clear, EOM's intact       Lungs:     Clear to auscultation bilaterally, respirations unlabored  Heart:    Regular rate and rhythm  Neurologic:   Awake, alert, oriented x 3. No apparent focal neurological           defect.       Results for orders placed or performed in visit on 07/27/18  POCT glycosylated hemoglobin (Hb A1C)  Result Value Ref Range   Hemoglobin A1C 7.0 (A) 4.0 -  5.6 %       Assessment &  Plan:     1. Type 2 diabetes mellitus with other circulatory complication, without long-term current use of insulin (HCC) Well controlled.  Continue current medications.   - POCT glycosylated hemoglobin (Hb A1C)  2. Essential hypertension Well controlled.  Continue current medications.    3. Hyperlipidemia, unspecified hyperlipidemia type He is tolerating atorvastatin well with no adverse effects.    4. Diarrhea, unspecified type Improving. Likely viral .gastroenteritis Call if symptoms change or if not rapidly improving.    5. Coronary artery disease involving native coronary artery of native heart with other form of angina pectoris ([place]) Asymptomatic. Compliant with medication.  Continue aggressive risk factor modification.  Continue regular follow up with cardiology.   6. COPD Doing well on current regiment of inhalers. Continue routine follow up with pulmonary as scheduled.    Flu shot today.   Return in about 4 months (around 11/26/2018).       [name], MD  [facility]

## 2018-08-06 ENCOUNTER — Other Ambulatory Visit: Payer: Self-pay | Admitting: Family Medicine

## 2018-08-11 ENCOUNTER — Other Ambulatory Visit: Payer: Self-pay | Admitting: Family Medicine

## 2018-08-11 DIAGNOSIS — M5410 Radiculopathy, site unspecified: Secondary | ICD-10-CM

## 2018-08-11 MED ORDER — OXYCODONE-ACETAMINOPHEN 10-325 MG PO TABS: 1.0000 | ORAL_TABLET | ORAL | 0 refills | Status: DC | PRN

## 2018-08-11 NOTE — Telephone Encounter (Signed)
Pt contacted office for refill request on the following medications:  oxyCODONE-acetaminophen (PERCOCET) 10-325 MG tablet  [facility]  Last Rx: 07/15/18 LOV: 07/27/18 NOV: 12/15/2018 Please advise. Thanks TNP

## 2018-08-12 DIAGNOSIS — G4710 Hypersomnia, unspecified: Secondary | ICD-10-CM | POA: Diagnosis not present

## 2018-08-12 DIAGNOSIS — J439 Emphysema, unspecified: Secondary | ICD-10-CM | POA: Diagnosis not present

## 2018-08-12 DIAGNOSIS — I10 Essential (primary) hypertension: Secondary | ICD-10-CM | POA: Diagnosis not present

## 2018-08-12 DIAGNOSIS — J449 Chronic obstructive pulmonary disease, unspecified: Secondary | ICD-10-CM | POA: Diagnosis not present

## 2018-08-15 IMAGING — CR DG CHEST 2V
2 series · 2 of 2 positions shown · non-contrast
Comparison: September 02, 2016

CLINICAL DATA: Chest fluttering.  Shortness of breath.

EXAM:
CHEST  2 VIEW

[chest lat]
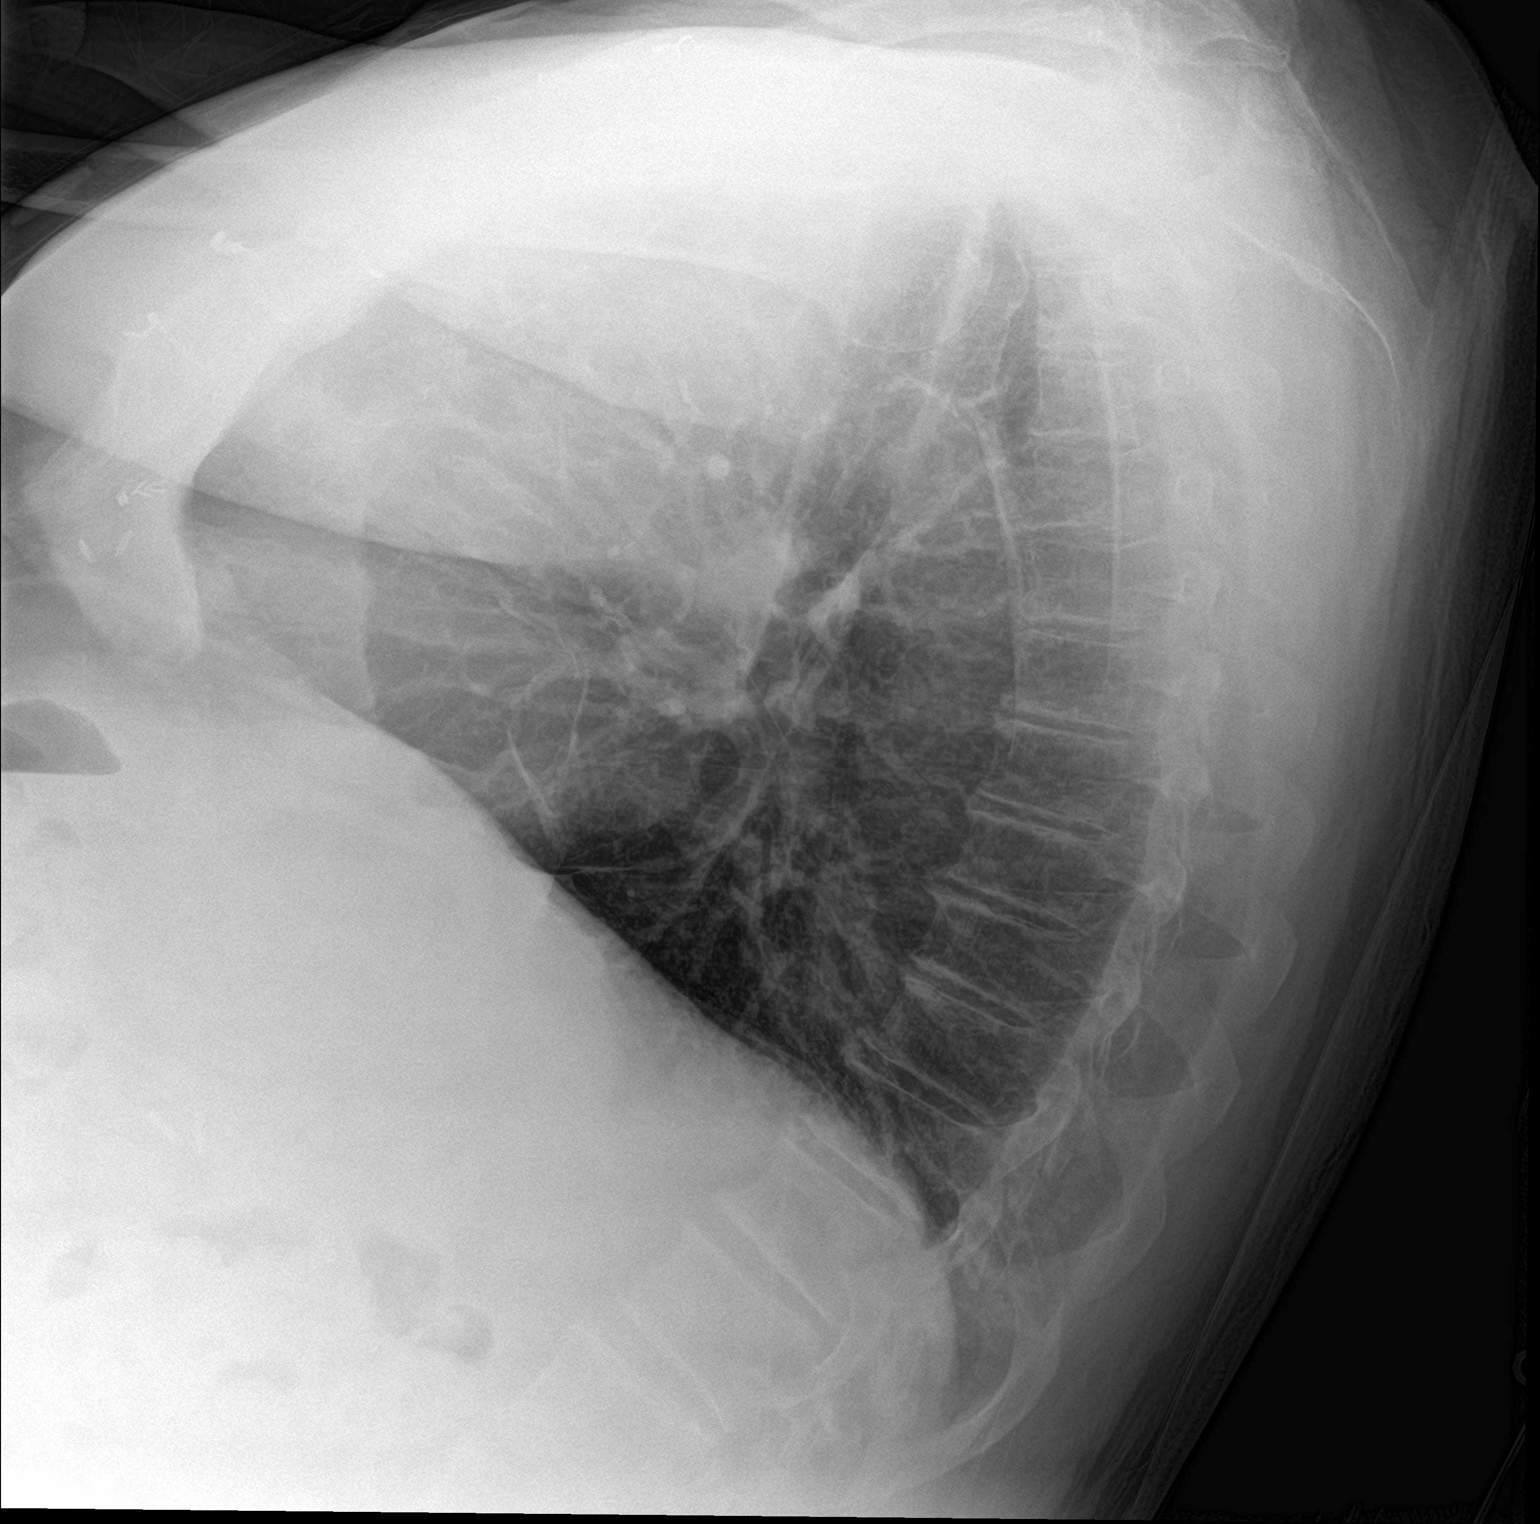

[chest ap]
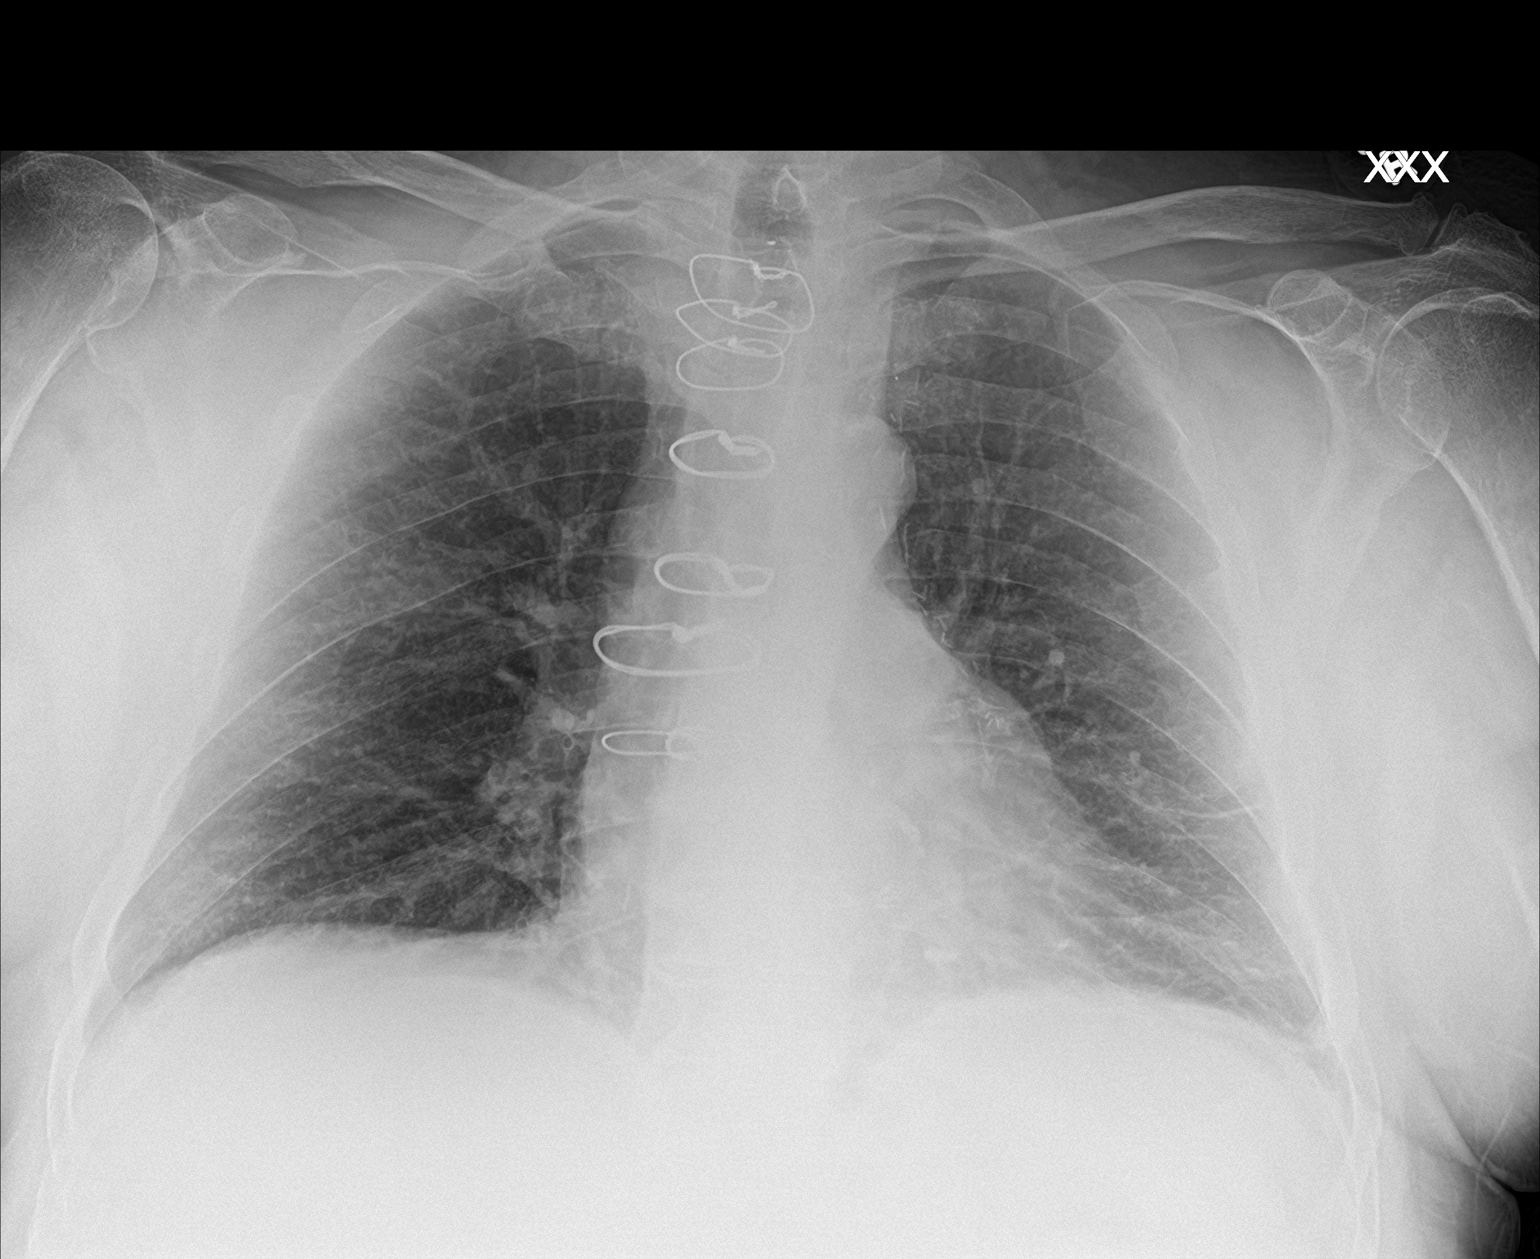

[2 of 2 positions shown; findings below may reference images not displayed]

FINDINGS: Opacity in left lung base favored to represent atelectasis, slightly
worsened in the interval. No other acute abnormalities.
IMPRESSION: Left basilar atelectasis.  No other acute abnormalities.

## 2018-08-15 NOTE — Progress Notes (Signed)
Patient ID: Lawrence [name], male    DOB: 1956/07/18, 62 y.o.   MRN: [id number]  HPI  Lawrence [name] is a 62 y/o male with a history of DM, PSVT, HTN, COPD (chronic oxygen), CAD with CABG, asthma, anxiety, anemia, atrial fibrillation, obstructive sleep apnea,  past tobacco use and chronic heart failure.   Echo report from 04/19/18 reviewed and showed an EF of 50-55%. Echo report from 11/10/17 reviewed and showed an EF of 50% along with mild AR/Lawrence/TR. Echo done 03/27/16 and showed an EF of 60-65% along with trivial Lawrence/TR. EF has improved from 45-50% November 2016. Most recent cardiac catheterization was done 11/02/16 and a DES was placed in the SVG to OM1 artery.   Was in the ED 06/08/18 due to chest pain. Medications were adjusted and he was released. Was in the ED 05/26/18 due to abdominal pain and emesis where he was treated and released. Was in the ED 04/30/18 due to abdominal pain/ shortness of breath. Was given IV fluids and GI follow-up. Released that same day. Was in the ED 03/25/18 due to ruptured stye of his left upper eyelid. Treated and released. Was in the ED 03/21/18 due to right foot pain after a fall. Xray negative for fracture and he was released.   He presents today for a follow-up visit with a chief complaint of minimal shortness of breath upon moderate exertion. He describes this as chronic in nature having been present for several years. He has associated fatigue, cough, abdominal distention, difficulty sleeping and eye pain/reddness along with this. He denies any palpitations, pedal edema, chest pain, wheezing, dizziness or weight gain. Says that his left eye has been hurting with redness and swelling for the last 3-4 days. He says that it feels like something is in it.   Past Medical History:  Diagnosis Date  . A-fib (Lawrence [name])   . Anemia   . Anginal pain ([place])   . Anxiety   . Arthritis   . Asthma   . CAD (coronary artery disease)    a. 2002 CABGx2 (LIMA->LAD, VG->VG->OM1);  b. 09/2012  DES->OM;  c. 03/2015 PTCA of LAD [facility]) in setting of atretic LIMA; d. 05/2015 Cath [facility]): nonobs dzs; e. 06/2015 Cath ([facility]): LM nl, LAD 45p/d ISR, 50d, D1/2 small, LCX 50p/d ISR, OM1 70ost, 30 ISR, VG->OM1 50ost, 70m LIMA->LAD 99p/d - atretic, RCA dom, nl; f.cath 10/16: 40-50%(FFR 0.90) pLAD, 75% (FFR 0.77) mLAD s/p PCI/DES, oRCA 40% (FFR0.95)  . Celiac disease   . Chronic diastolic CHF (congestive heart failure) (Lawrence [name]    a. 06/2009 Echo: EF 60-65%, Gr 1 DD, triv AI, mildly dil LA, nl RV.  .Marland KitchenCOPD (chronic obstructive pulmonary disease) (Lawrence [name]    a. Chronic bronchitis and emphysema.  . Diverticulosis   . Dysrhythmia   . Essential hypertension   . GERD (gastroesophageal reflux disease)   . History of tobacco abuse    a. Quit 2014.  .Marland KitchenPancreatitis   . PSVT ([place]oxysmal supraventricular tachycardia) [place]    a. 10/2012 Noted on Zio Patch.  . Tubular adenoma of colon   . Type II diabetes mellitus ([place]    Past Surgical History:  Procedure Laterality Date  . BYPASS GRAFT    . CARDIAC CATHETERIZATION N/A 07/12/2015   rocedure: Left Heart Cath and Cors/Grafts Angiography;  Surgeon: HBelva Crome MD;  [place];  Service: Cardiovascular;  Laterality: N/A;  . CARDIAC CATHETERIZATION Right 10/07/2015   Procedure: Left Heart Cath and Cors/Grafts  Angiography;  Surgeon: Dionisio David, MD;  Location: [place] [facility];  Service: Cardiovascular;  Laterality: Right;  . CARDIAC CATHETERIZATION N/A 04/06/2016   Procedure: Left Heart Cath and Coronary Angiography;  Surgeon: Lawrence Kida, MD;  Location: [place] [facility];  Service: Cardiovascular;  Laterality: N/A;  . CARDIAC CATHETERIZATION  04/06/2016   Procedure: Bypass Graft Angiography;  Surgeon: Lawrence Kida, MD;  Location: [place] [facility];  Service: Cardiovascular;;  . CARDIAC CATHETERIZATION N/A 11/02/2016   Procedure: Left Heart Cath and Cors/Grafts Angiography and possible PCI;  Surgeon: Lawrence Kida, MD;   Location: [place] [facility];  Service: Cardiovascular;  Laterality: N/A;  . CARDIAC CATHETERIZATION N/A 11/02/2016   Procedure: Coronary Stent Intervention;  Surgeon: Lawrence Kida, MD;  Location: [place] [facility];  Service: Cardiovascular;  Laterality: N/A;  . CHOLECYSTECTOMY    . COLONOSCOPY WITH PROPOFOL N/A 04/01/2018   Procedure: COLONOSCOPY WITH PROPOFOL;  Surgeon: Lawrence Silvas, MD;  Location: [facility] ENDOSCOPY;  Service: Endoscopy;  Laterality: N/A;  . ESOPHAGEAL DILATION    . ESOPHAGOGASTRODUODENOSCOPY (EGD) WITH PROPOFOL N/A 04/01/2018   Procedure: ESOPHAGOGASTRODUODENOSCOPY (EGD) WITH PROPOFOL;  Surgeon: Lawrence Silvas, MD;  Location: [facility] ENDOSCOPY;  Service: Endoscopy;  Laterality: N/A;  . TONSILLECTOMY    . VASCULAR SURGERY     Family History  Problem Relation Age of Onset  . Heart attack Mother   . Depression Mother   . Heart disease Mother   . COPD Mother   . Hypertension Mother   . Heart attack Father   . Diabetes Father   . Depression Father   . Heart disease Father   . Cirrhosis Father   . Parkinson's disease Brother    Social History   Tobacco Use  . Smoking status: Former Smoker    Last attempt to quit: 04/22/2013    Years since quitting: 5.3  . Smokeless tobacco: Never Used  Substance Use Topics  . Alcohol use: No    Comment: remotely quit alcohol use. Hx of heavy alcohol use.   Allergies  Allergen Reactions  . Demerol [Meperidine] Hives  . Jardiance [Empagliflozin]     Perineal pain  . Prednisone Other (See Comments) and Hypertension    Pt states that this medication puts him in A-fib   . Sulfa Antibiotics Hives  . Albuterol Sulfate [Albuterol] Palpitations and Other (See Comments)    Pt currently uses this medication.    . Morphine Sulfate Nausea And Vomiting, Rash and Other (See Comments)    Pt states that he is only allergic to the tablet form of this medication.     Prior to Admission medications   Medication Sig Start Date End  Date Taking? Authorizing Provider  albuterol (PROVENTIL HFA;VENTOLIN HFA) 108 (90 Base) MCG/ACT inhaler Inhale 2 puffs into the lungs every 4 (four) hours as needed for wheezing or shortness of breath. Reported on 04/08/2016 10/21/17  Yes Birdie Sons, MD  albuterol (PROVENTIL) (2.5 MG/3ML) 0.083% nebulizer solution Take 3 mLs (2.5 mg total) by nebulization every 6 (six) hours as needed for wheezing or shortness of breath. 01/04/18  Yes Birdie Sons, MD  allopurinol (ZYLOPRIM) 100 MG tablet TAKE 3 TABLETS BY MOUTH TWICE A DAY. 07/21/18  Yes Birdie Sons, MD  ALPRAZolam Duanne Moron) 1 MG tablet TAKE ONE-HALF TO ONE TABLET BY MOUTH THREE TIMES A DAY AS NEEDED 03/24/18  Yes Birdie Sons, MD  aspirin 81 MG chewable tablet Chew 81 mg  by mouth daily.   Yes [provider]  atorvastatin (LIPITOR) 80 MG tablet TAKE ONE TABLET BY MOUTH EVERY NIGHT AT BEDTIME 08/13/17  Yes Birdie Sons, MD  Blood Glucose Monitoring Suppl (ACCU-CHEK AVIVA PLUS) w/Device KIT CHECK BLOOD SUGAR EVERY DAY 12/08/17  Yes Birdie Sons, MD  cetirizine (ZYRTEC) 10 MG tablet TAKE ONE TABLET AT BEDTIME 07/11/18  Yes Birdie Sons, MD  cyclobenzaprine (FLEXERIL) 5 MG tablet TAKE 1 TO 2 TABLETS BY MOUTH THREE TIMESDAILY AS NEEDED (BACK PAIN) 05/24/18  Yes Birdie Sons, MD  docusate sodium (COLACE) 100 MG capsule Take 100 mg by mouth daily.    Yes [provider]  ELIQUIS 5 MG TABS tablet TAKE 1 TABLET BY MOUTH TWICE A DAY. 08/06/18  Yes Birdie Sons, MD  fluticasone furoate-vilanterol (BREO ELLIPTA) 100-25 MCG/INH AEPB Inhale 1 puff into the lungs daily. 05/13/18  Yes Devona Konig A, MD  glucose blood (ACCU-CHEK AVIVA PLUS) test strip Use to check blood sugar once a day 02/14/18  Yes Birdie Sons, MD  isosorbide mononitrate (IMDUR) 60 MG 24 hr tablet Take 1 tablet (60 mg total) by mouth daily. Patient taking differently: Take 60 mg by mouth 2 (two) times daily.  11/04/16  Yes Sainani, Belia Heman, MD  LINZESS  72 MCG capsule Take 72 mcg by mouth daily.   Yes [provider]  lisinopril (PRINIVIL,ZESTRIL) 2.5 MG tablet TAKE 1 TABLET BY MOUTH EVERY DAY 03/11/18  Yes Birdie Sons, MD  magnesium oxide (MAG-OX) 400 (241.3 Mg) MG tablet TAKE 1 TABLET BY MOUTH EVERY DAY 05/03/18  Yes Darylene Price A, FNP  metFORMIN (GLUCOPHAGE) 500 MG tablet Take 1 tablet (500 mg total) by mouth 2 (two) times daily with a meal. 06/15/18  Yes Birdie Sons, MD  nitroGLYCERIN (NITROSTAT) 0.4 MG SL tablet Place 1 tablet (0.4 mg total) under the tongue every 5 (five) minutes as needed for chest pain. Reported on 05/26/2016 10/06/16  Yes Birdie Sons, MD  omega-3 acid ethyl esters (LOVAZA) 1 g capsule TAKE FOUR CAPSULES BY MOUTH DAILY 06/24/18  Yes Birdie Sons, MD  omeprazole (PRILOSEC) 40 MG capsule Take 1 capsule by mouth 2 (two) times daily. 04/22/18 04/22/19 Yes [provider]  ondansetron (ZOFRAN ODT) 4 MG disintegrating tablet Take 1 tablet (4 mg total) by mouth every 8 (eight) hours as needed for nausea or vomiting. 04/30/18  Yes Earleen Newport, MD  oxyCODONE-acetaminophen (PERCOCET) 10-325 MG tablet Take 1 tablet by mouth every 4 (four) hours as needed. 08/11/18  Yes Birdie Sons, MD  OXYGEN Inhale 2 L into the lungs. Wears ATC and at night   Yes [provider]  RANEXA 500 MG 12 hr tablet Take 1,000 mg by mouth 2 (two) times daily.   Yes [provider]  sotalol (BETAPACE) 120 MG tablet Take 120 mg by mouth 2 (two) times daily.    Yes [provider]  SPIRIVA HANDIHALER 18 MCG inhalation capsule INHALE ONE CAPSULE AS DIRECTED ONCE A DAY 02/03/18  Yes Birdie Sons, MD  tamsulosin (FLOMAX) 0.4 MG CAPS capsule Take 0.4 mg by mouth daily.    Yes [provider]  torsemide (DEMADEX) 100 MG tablet Take 0.5 tablets by mouth daily. 10/11/17  Yes [provider]  empagliflozin (JARDIANCE) 10 MG TABS tablet Take 10 mg by mouth.  03/05/17   [provider]  potassium chloride (K-DUR,KLOR-CON) 10 MEQ tablet Take by mouth.  [provider]    Review of Systems  Constitutional: Positive for fatigue. Negative for appetite change.  HENT: Negative for congestion, rhinorrhea and sore throat.   Eyes: Positive for pain (left eye), redness (lateral part of eye) and visual disturbance (left eye with blurry vision).  Respiratory: Positive for cough and shortness of breath (with moderate exertion). Negative for chest tightness and wheezing.   Cardiovascular: Negative for chest pain, palpitations and leg swelling.  Gastrointestinal: Positive for abdominal distention. Negative for abdominal pain.  Endocrine: Negative.   Genitourinary: Negative.   Musculoskeletal: Positive for back pain (chronic). Negative for neck pain.  Skin: Negative.   Allergic/Immunologic: Negative.   Neurological: Negative for dizziness, weakness and light-headedness.  Hematological: Negative for adenopathy. Bruises/bleeds easily.  Psychiatric/Behavioral: Positive for sleep disturbance (wearing oxygen at 2L; not wearing CPAP due to space issues). Negative for dysphoric mood and suicidal ideas. The patient is not nervous/anxious.    Vitals:   08/17/18 0941  BP: (!) 118/53  Pulse: 72  Resp: 18  SpO2: 92%  Weight: 249 lb 8 oz (113.2 kg)  Height: _0  (1.702 m)   Wt Readings from Last 3 Encounters:  08/17/18 249 lb 8 oz (113.2 kg)  07/27/18 247 lb (112 kg)  06/20/18 254 lb (115.2 kg)   Lab Results  Component Value Date   CREATININE 1.06 05/26/2018   CREATININE 0.97 05/17/2018   CREATININE 1.50 (H) 04/30/2018    Physical Exam  Constitutional: He is oriented to person, place, and time. He appears well-developed and well-nourished.  HENT:  Head: Normocephalic and atraumatic.  Eyes:  Upper and lower left eyelid is swollen with redness noticed along the lateral side of the eye  Neck: Normal range of motion. Neck supple. No JVD present.   Cardiovascular: Normal rate and regular rhythm.  Pulmonary/Chest: Effort normal. He has no wheezes. He has no rales.  Abdominal: Soft. He exhibits distension. There is no tenderness.  Musculoskeletal: He exhibits no edema or tenderness.  Neurological: He is alert and oriented to person, place, and time.  Skin: Skin is warm and dry.  Psychiatric: He has a normal mood and affect. His behavior is normal. Thought content normal.  Nursing note and vitals reviewed.  Assessment & Plan:  1: Chronic heart failure with preserved ejection fraction- - NYHA class II - mildly fluid overloaded today although stable - weighing daily; Reminded to call for an overnight weight gain of >2 pounds or a weekly weight gain of >5 pounds.  - weight down 5 pounds since he was last here 3 months ago - not adding salt and is currently rinsing canned foods. Reviewed the importance of closely following a 2088m sodium diet - drinking ~ 64 ounces of liquids daily - saw cardiologist (Lyndel Pleasure 07/04/18 - BNP 04/30/18 was 28.0 - PharmD reconciled medications with the patient  2: HTN- - BP looks good today - saw PCP ([name] 07/27/18 - BMP 05/26/18 reviewed and showed sodium 138, potassium 4.3, creatinine 1.06  and GFR 47  3: COPD- - had pulmonary function test performed 04/13/18 which showed normal forced vital capacity. FEV1 is 2.03 L which is 70% of predicted and is mildly decreased.  - continues to wear oxygen at 2L around the clock although doesn't have it on today - saw pulmonologist (Humphrey Rolls 06/20/18  4: Diabetes-  - fasting glucose at home yesterday was 130  5: Obstructive sleep apnea- - patient is not currently using his CPAP at night for his OSA because he needs  a new cord. He called company [employer] who stated that cord was $70. Patient states that he cannot afford this.  - patient is to call [employer] back and get information about what type of cord this is so we can see about getting it paid for - discussed how  untreated sleep apnea can affect his heart  6: Eye pain- - pain and swelling began 3-4 days ago and he says that he feels like something is in it - has blurry vision in the left eye as well - has been putting warm compresses on it but without improvement - instructed him to call his eye doctor and explain symptoms and patient and his wife say that they will stop by the eye doctor's office after leaving here   Patient did not bring his medications nor a list. Each medication was verbally reviewed with the patient and he was encouraged to bring the bottles to every visit to confirm accuracy of list.  Return in 6 months or sooner for any questions/problems before then.

## 2018-08-17 ENCOUNTER — Encounter: Payer: Self-pay | Admitting: Family

## 2018-08-17 ENCOUNTER — Ambulatory Visit: Payer: Medicare HMO | Attending: Family | Admitting: Family

## 2018-08-17 VITALS — BP 118/53 | HR 72 | Resp 18 | Ht 67.0 in | Wt 249.5 lb

## 2018-08-17 DIAGNOSIS — Z955 Presence of coronary angioplasty implant and graft: Secondary | ICD-10-CM | POA: Insufficient documentation

## 2018-08-17 DIAGNOSIS — Z7982 Long term (current) use of aspirin: Secondary | ICD-10-CM | POA: Insufficient documentation

## 2018-08-17 DIAGNOSIS — K219 Gastro-esophageal reflux disease without esophagitis: Secondary | ICD-10-CM | POA: Insufficient documentation

## 2018-08-17 DIAGNOSIS — F419 Anxiety disorder, unspecified: Secondary | ICD-10-CM | POA: Insufficient documentation

## 2018-08-17 DIAGNOSIS — I509 Heart failure, unspecified: Secondary | ICD-10-CM | POA: Diagnosis not present

## 2018-08-17 DIAGNOSIS — J410 Simple chronic bronchitis: Secondary | ICD-10-CM

## 2018-08-17 DIAGNOSIS — G4733 Obstructive sleep apnea (adult) (pediatric): Secondary | ICD-10-CM | POA: Insufficient documentation

## 2018-08-17 DIAGNOSIS — I5032 Chronic diastolic (congestive) heart failure: Secondary | ICD-10-CM

## 2018-08-17 DIAGNOSIS — I2510 Atherosclerotic heart disease of native coronary artery without angina pectoris: Secondary | ICD-10-CM | POA: Insufficient documentation

## 2018-08-17 DIAGNOSIS — D649 Anemia, unspecified: Secondary | ICD-10-CM | POA: Insufficient documentation

## 2018-08-17 DIAGNOSIS — J449 Chronic obstructive pulmonary disease, unspecified: Secondary | ICD-10-CM | POA: Diagnosis not present

## 2018-08-17 DIAGNOSIS — Z7951 Long term (current) use of inhaled steroids: Secondary | ICD-10-CM | POA: Insufficient documentation

## 2018-08-17 DIAGNOSIS — E119 Type 2 diabetes mellitus without complications: Secondary | ICD-10-CM | POA: Insufficient documentation

## 2018-08-17 DIAGNOSIS — Z8249 Family history of ischemic heart disease and other diseases of the circulatory system: Secondary | ICD-10-CM | POA: Insufficient documentation

## 2018-08-17 DIAGNOSIS — H538 Other visual disturbances: Secondary | ICD-10-CM | POA: Insufficient documentation

## 2018-08-17 DIAGNOSIS — Z9981 Dependence on supplemental oxygen: Secondary | ICD-10-CM | POA: Diagnosis not present

## 2018-08-17 DIAGNOSIS — I471 Supraventricular tachycardia: Secondary | ICD-10-CM | POA: Diagnosis not present

## 2018-08-17 DIAGNOSIS — K900 Celiac disease: Secondary | ICD-10-CM | POA: Insufficient documentation

## 2018-08-17 DIAGNOSIS — Z87891 Personal history of nicotine dependence: Secondary | ICD-10-CM | POA: Diagnosis not present

## 2018-08-17 DIAGNOSIS — Z951 Presence of aortocoronary bypass graft: Secondary | ICD-10-CM | POA: Insufficient documentation

## 2018-08-17 DIAGNOSIS — M1990 Unspecified osteoarthritis, unspecified site: Secondary | ICD-10-CM | POA: Diagnosis not present

## 2018-08-17 DIAGNOSIS — Z79899 Other long term (current) drug therapy: Secondary | ICD-10-CM | POA: Insufficient documentation

## 2018-08-17 DIAGNOSIS — I110 Hypertensive heart disease with heart failure: Secondary | ICD-10-CM | POA: Insufficient documentation

## 2018-08-17 DIAGNOSIS — Z7984 Long term (current) use of oral hypoglycemic drugs: Secondary | ICD-10-CM | POA: Insufficient documentation

## 2018-08-17 DIAGNOSIS — I4891 Unspecified atrial fibrillation: Secondary | ICD-10-CM | POA: Diagnosis not present

## 2018-08-17 DIAGNOSIS — H5712 Ocular pain, left eye: Secondary | ICD-10-CM | POA: Diagnosis not present

## 2018-08-17 DIAGNOSIS — E1159 Type 2 diabetes mellitus with other circulatory complications: Secondary | ICD-10-CM

## 2018-08-17 DIAGNOSIS — I10 Essential (primary) hypertension: Secondary | ICD-10-CM

## 2018-08-17 DIAGNOSIS — Z7901 Long term (current) use of anticoagulants: Secondary | ICD-10-CM | POA: Diagnosis not present

## 2018-08-17 NOTE — Patient Instructions (Signed)
Continue weighing daily and call for an overnight weight gain of > 2 pounds or a weekly weight gain of >5 pounds. 

## 2018-08-18 ENCOUNTER — Encounter: Payer: Self-pay | Admitting: Family

## 2018-08-18 DIAGNOSIS — H5712 Ocular pain, left eye: Secondary | ICD-10-CM | POA: Insufficient documentation

## 2018-08-19 ENCOUNTER — Other Ambulatory Visit: Payer: Self-pay

## 2018-08-19 DIAGNOSIS — H0019 Chalazion unspecified eye, unspecified eyelid: Secondary | ICD-10-CM | POA: Diagnosis not present

## 2018-08-19 MED ORDER — FLUTICASONE FUROATE-VILANTEROL 100-25 MCG/INH IN AEPB: 1.0000 | INHALATION_SPRAY | Freq: Every day | RESPIRATORY_TRACT | 2 refills | Status: DC

## 2018-09-08 ENCOUNTER — Other Ambulatory Visit: Payer: Self-pay | Admitting: Family Medicine

## 2018-09-08 DIAGNOSIS — M5410 Radiculopathy, site unspecified: Secondary | ICD-10-CM

## 2018-09-08 MED ORDER — OXYCODONE-ACETAMINOPHEN 10-325 MG PO TABS: 1.0000 | ORAL_TABLET | ORAL | 0 refills | Status: DC | PRN

## 2018-09-08 NOTE — Telephone Encounter (Signed)
Pt needs refill on   Oxycodone [place] [facility]  CB#  [phone]  Thanks [employer]

## 2018-09-08 NOTE — Telephone Encounter (Signed)
Please advise 

## 2018-09-10 IMAGING — CR DG CHEST 2V
2 series · 2 of 2 positions shown · non-contrast
Comparison: Chest radiograph 10/03/2016

CLINICAL DATA: Chest pain

EXAM:
CHEST  2 VIEW

[chest pa]
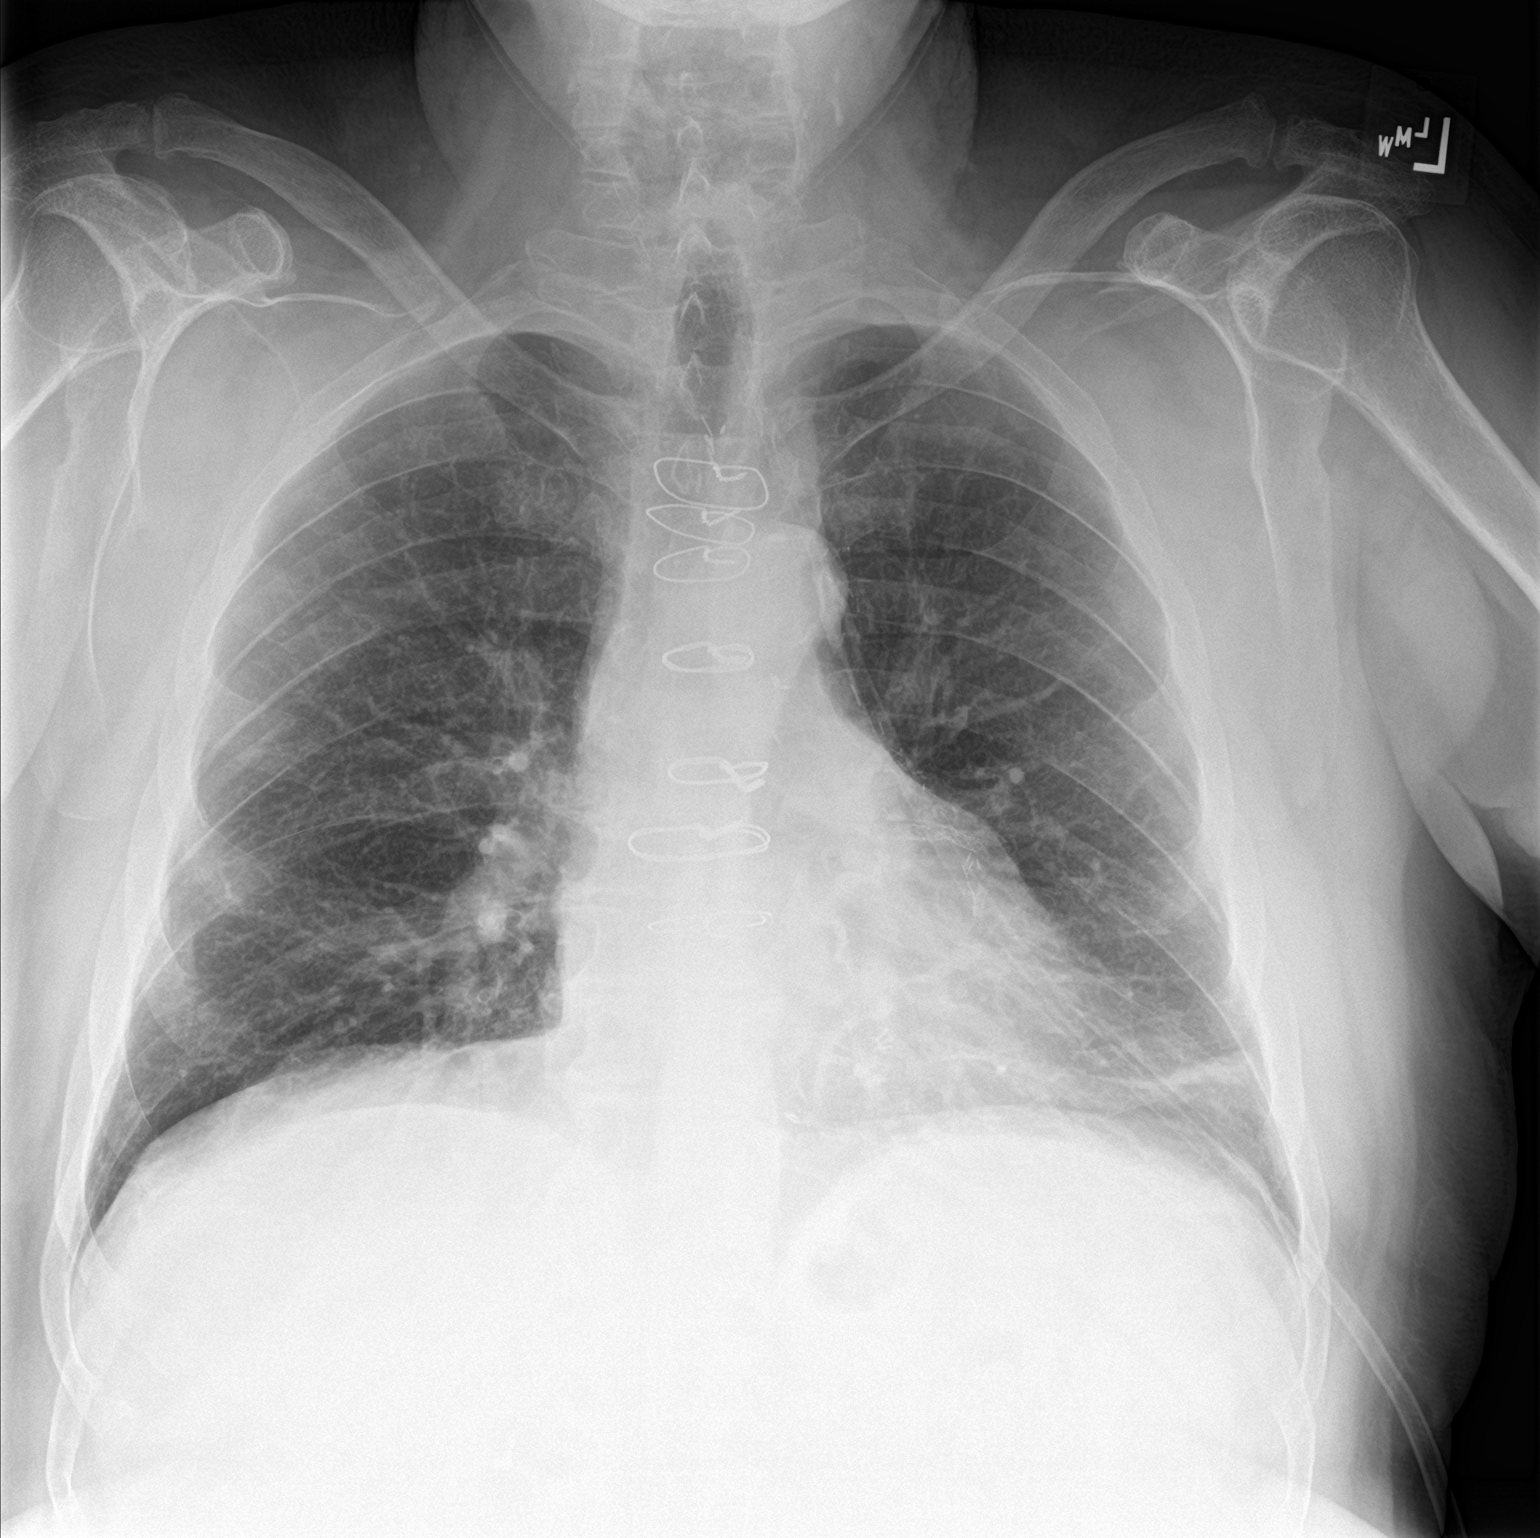

[chest lat]
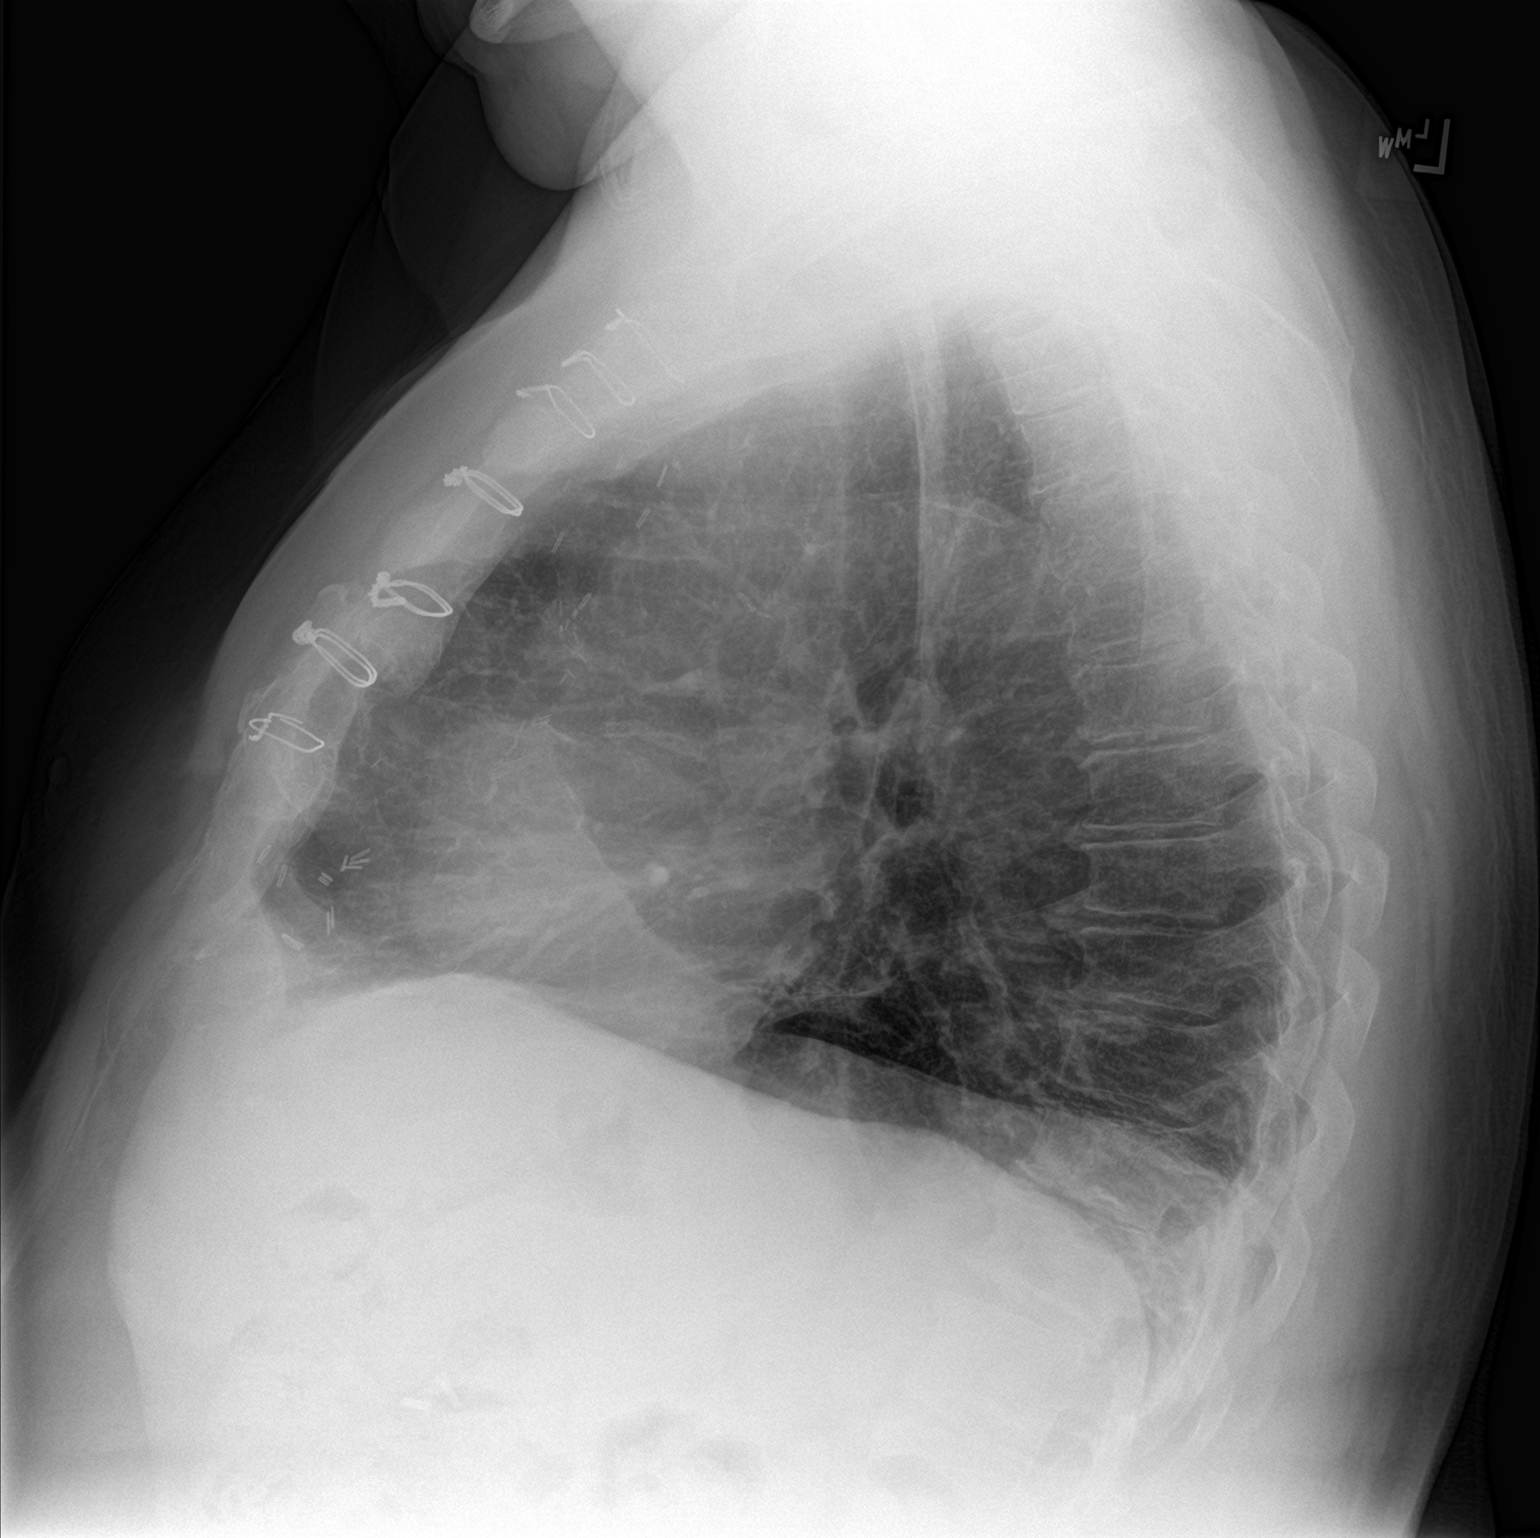

[2 of 2 positions shown; findings below may reference images not displayed]

FINDINGS: There are median sternotomy wires. There is calcification within the
aortic arch. There is left basilar atelectasis. No pleural effusion
or pneumothorax. No pulmonary edema.
IMPRESSION: Left basilar atelectasis and aortic atherosclerosis. No pulmonary
edema.

## 2018-09-11 DIAGNOSIS — G4710 Hypersomnia, unspecified: Secondary | ICD-10-CM | POA: Diagnosis not present

## 2018-09-11 DIAGNOSIS — J449 Chronic obstructive pulmonary disease, unspecified: Secondary | ICD-10-CM | POA: Diagnosis not present

## 2018-09-11 DIAGNOSIS — I10 Essential (primary) hypertension: Secondary | ICD-10-CM | POA: Diagnosis not present

## 2018-09-11 DIAGNOSIS — J439 Emphysema, unspecified: Secondary | ICD-10-CM | POA: Diagnosis not present

## 2018-09-19 ENCOUNTER — Other Ambulatory Visit: Payer: Self-pay | Admitting: Family Medicine

## 2018-10-05 ENCOUNTER — Telehealth: Payer: Self-pay | Admitting: Family Medicine

## 2018-10-05 MED ORDER — ALLOPURINOL 300 MG PO TABS: 300.0000 mg | ORAL_TABLET | Freq: Two times a day (BID) | ORAL | 4 refills | Status: DC

## 2018-10-05 NOTE — Telephone Encounter (Signed)
Please advise patient have sent new prescription for 379m tabletes, so he only has to take ONE tablet twice a day.

## 2018-10-05 NOTE — Telephone Encounter (Signed)
Patient advised and verbally voiced understanding. Pharmacy advised as well.

## 2018-10-05 NOTE — Telephone Encounter (Signed)
[facility] - [phone] with [place], [place] [phone] (Phone) [phone] (Fax)   Calling regarding pt's  allopurinol (ZYLOPRIM) 100 MG tablet Taking 3 tablets 2 times a day with quantity of  90 tablets only lasting pt for 15 days. Asking if quantity can be increased to 180 so it will last pt for 30 days.  Please advise.  Thanks, [employer]

## 2018-10-06 ENCOUNTER — Other Ambulatory Visit: Payer: Self-pay | Admitting: Family Medicine

## 2018-10-06 DIAGNOSIS — M5410 Radiculopathy, site unspecified: Secondary | ICD-10-CM

## 2018-10-06 DIAGNOSIS — F419 Anxiety disorder, unspecified: Secondary | ICD-10-CM

## 2018-10-06 NOTE — Telephone Encounter (Signed)
Patient needs refill on Xanax 1 mg and Oxycodone 10-325 mg. Sent to [facility].

## 2018-10-12 DIAGNOSIS — J449 Chronic obstructive pulmonary disease, unspecified: Secondary | ICD-10-CM | POA: Diagnosis not present

## 2018-10-12 DIAGNOSIS — I10 Essential (primary) hypertension: Secondary | ICD-10-CM | POA: Diagnosis not present

## 2018-10-12 DIAGNOSIS — J439 Emphysema, unspecified: Secondary | ICD-10-CM | POA: Diagnosis not present

## 2018-10-12 DIAGNOSIS — G4710 Hypersomnia, unspecified: Secondary | ICD-10-CM | POA: Diagnosis not present

## 2018-10-12 IMAGING — CR DG CHEST 2V
2 series · 2 of 2 positions shown · non-contrast
Comparison: 10/29/2016 and 10/03/2016

CLINICAL DATA: Productive cough and chest congestion.

EXAM:
CHEST  2 VIEW

[chest pa]
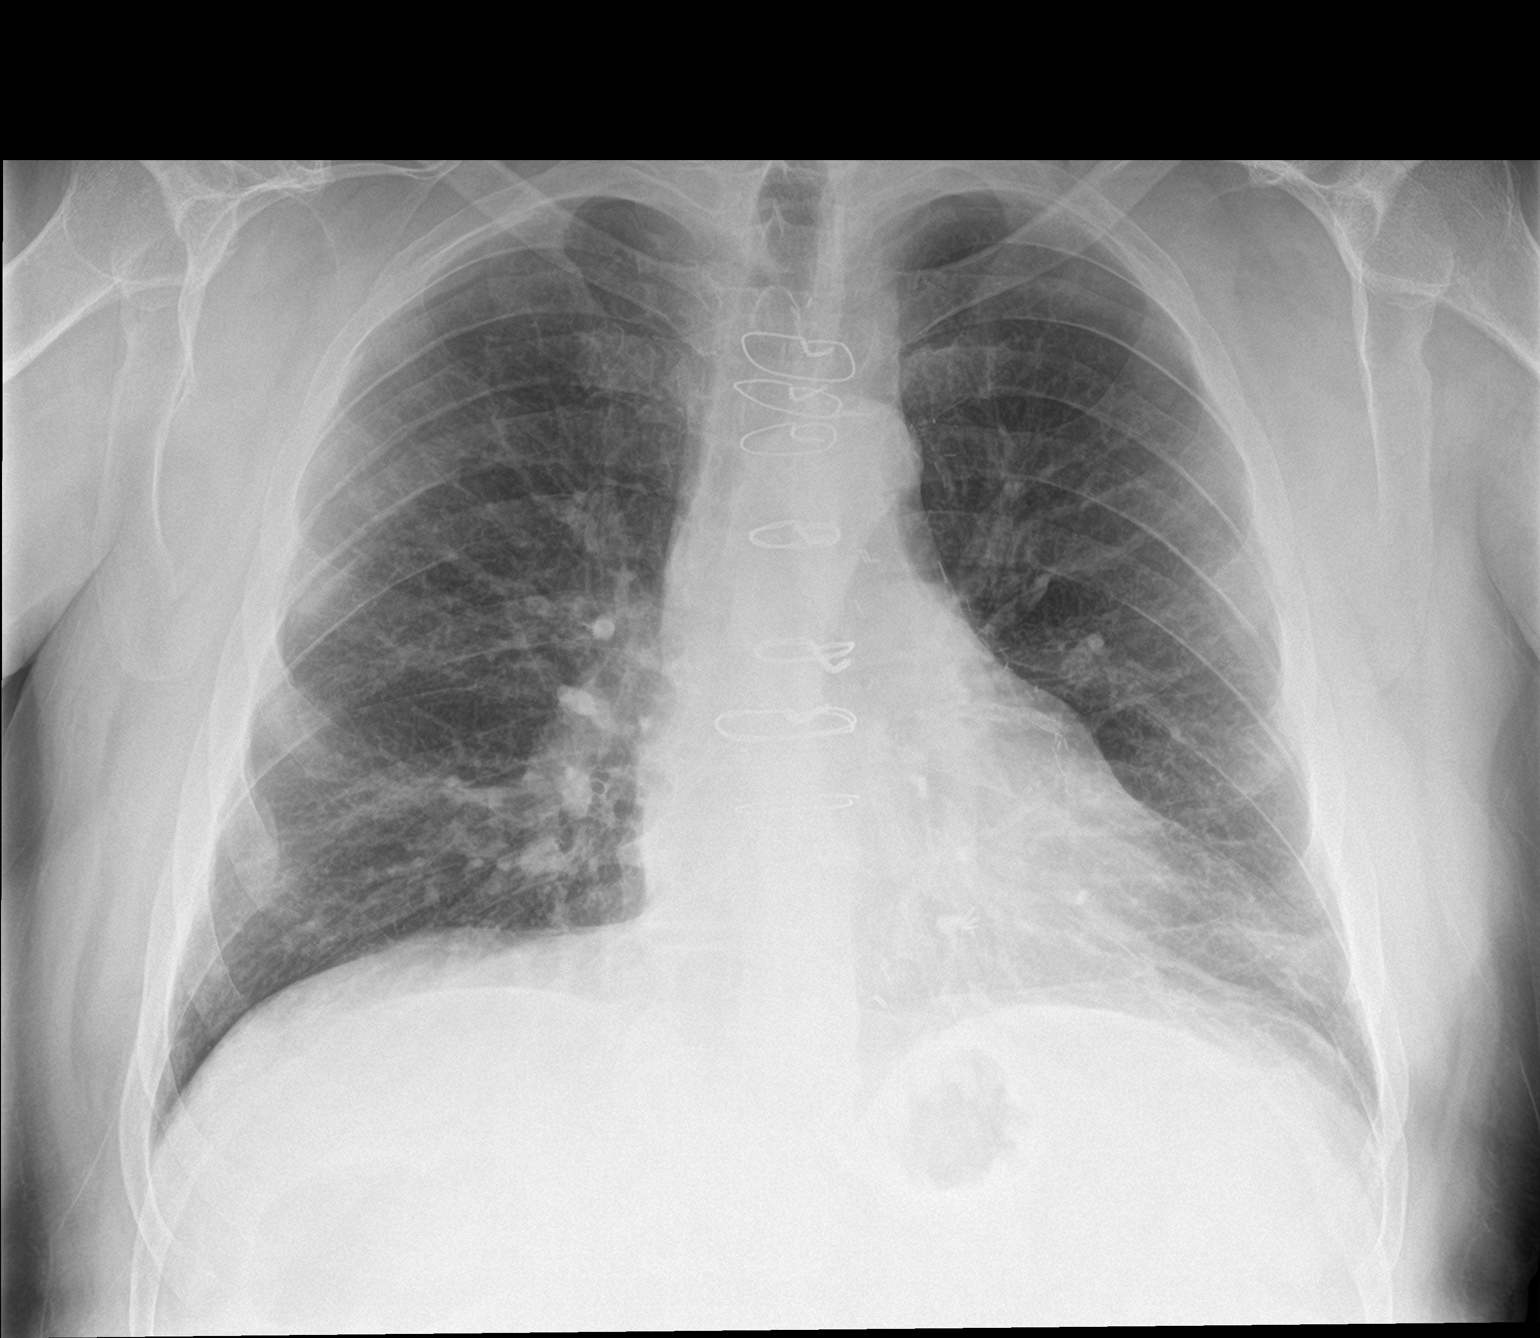

[chest lat]
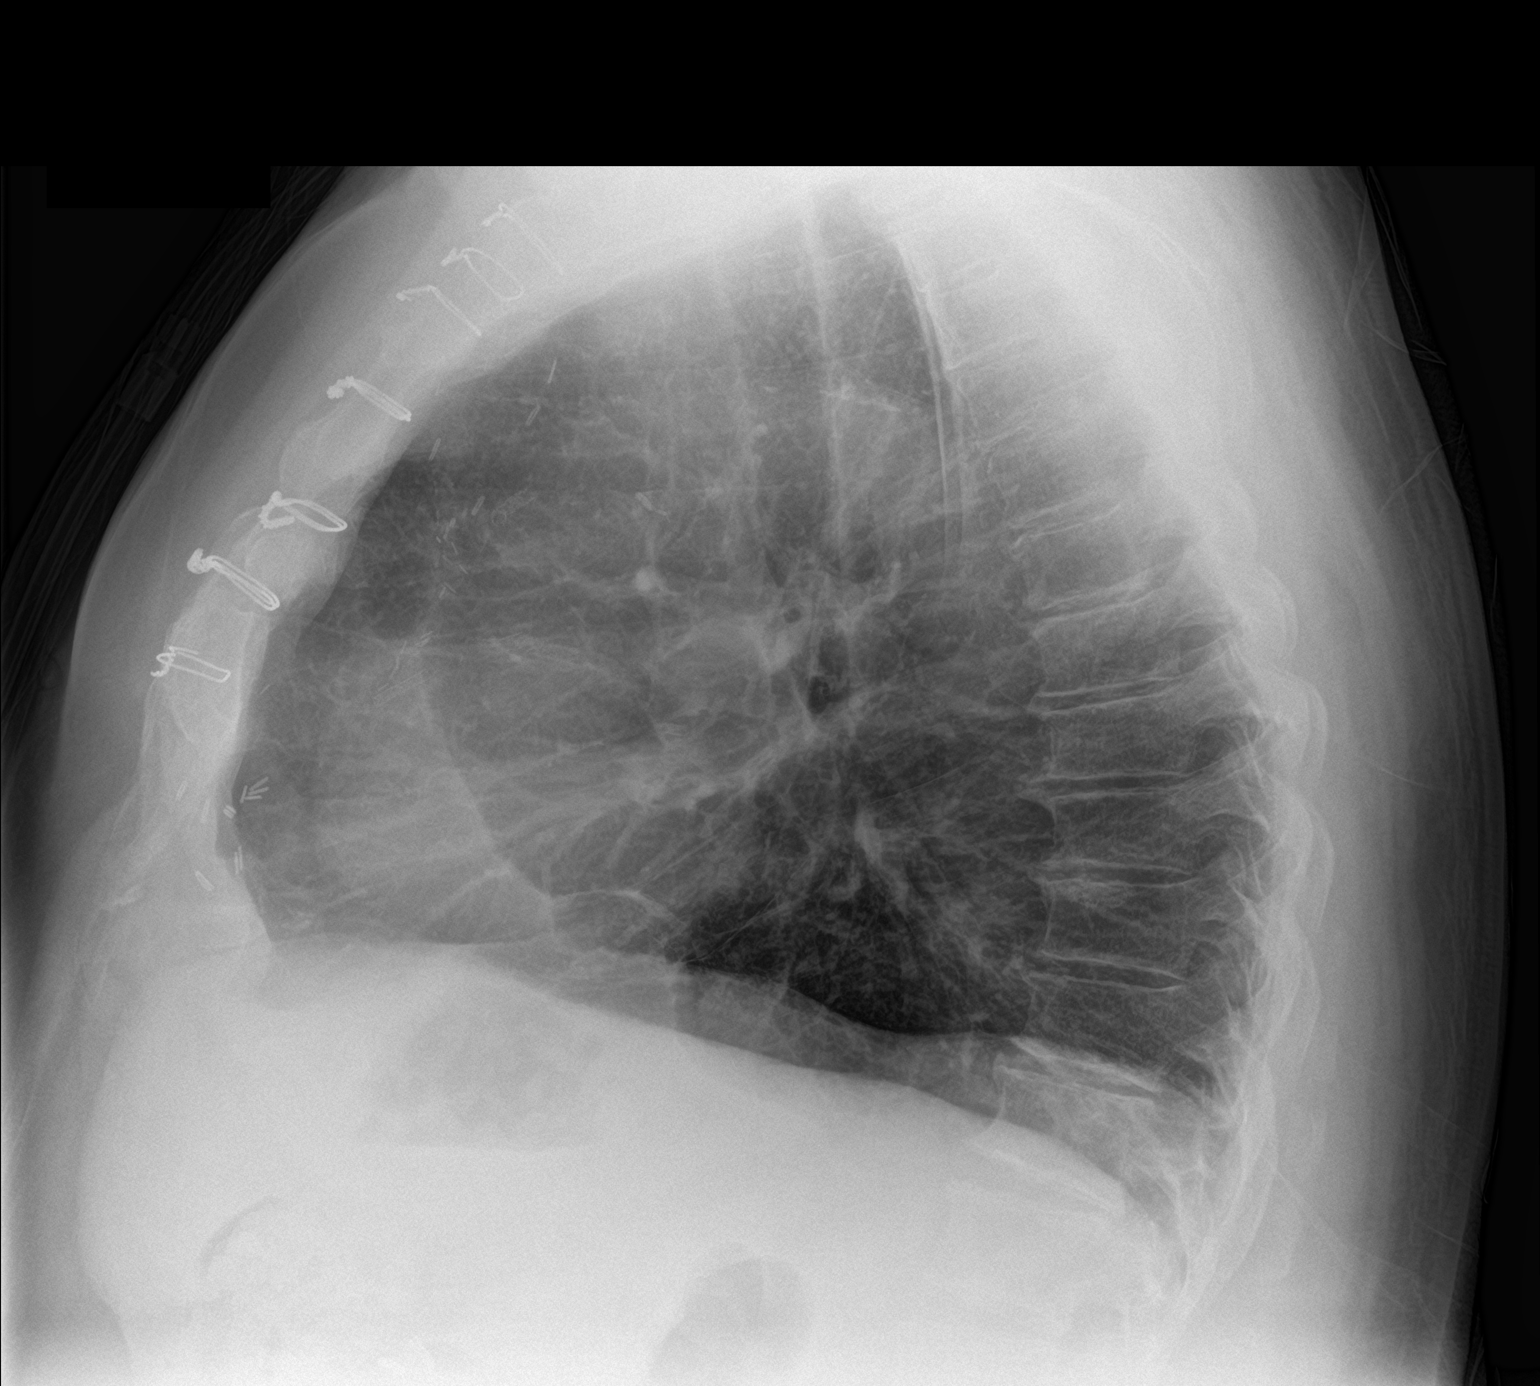

[2 of 2 positions shown; findings below may reference images not displayed]

FINDINGS: Heart size and pulmonary vascularity are normal. No infiltrates or
effusions. Slight scarring at the left lung base laterally. Old
right anterolateral rib fractures. The lungs are somewhat
hyperinflated with flattening of the diaphragm consistent with
emphysema. Previous median sternotomy.
IMPRESSION: No acute abnormalities.  Findings consistent with emphysema.

## 2018-10-15 DIAGNOSIS — R61 Generalized hyperhidrosis: Secondary | ICD-10-CM | POA: Diagnosis not present

## 2018-10-15 DIAGNOSIS — R0602 Shortness of breath: Secondary | ICD-10-CM | POA: Diagnosis not present

## 2018-10-15 DIAGNOSIS — Z951 Presence of aortocoronary bypass graft: Secondary | ICD-10-CM | POA: Diagnosis not present

## 2018-10-15 DIAGNOSIS — R079 Chest pain, unspecified: Secondary | ICD-10-CM | POA: Diagnosis not present

## 2018-10-15 DIAGNOSIS — I471 Supraventricular tachycardia: Secondary | ICD-10-CM | POA: Diagnosis not present

## 2018-10-15 DIAGNOSIS — K219 Gastro-esophageal reflux disease without esophagitis: Secondary | ICD-10-CM | POA: Diagnosis not present

## 2018-10-15 DIAGNOSIS — I5032 Chronic diastolic (congestive) heart failure: Secondary | ICD-10-CM | POA: Diagnosis not present

## 2018-10-15 DIAGNOSIS — I4891 Unspecified atrial fibrillation: Secondary | ICD-10-CM | POA: Diagnosis not present

## 2018-10-15 DIAGNOSIS — E669 Obesity, unspecified: Secondary | ICD-10-CM | POA: Diagnosis not present

## 2018-10-15 DIAGNOSIS — E119 Type 2 diabetes mellitus without complications: Secondary | ICD-10-CM | POA: Diagnosis not present

## 2018-10-15 DIAGNOSIS — K210 Gastro-esophageal reflux disease with esophagitis: Secondary | ICD-10-CM | POA: Diagnosis not present

## 2018-10-15 DIAGNOSIS — J449 Chronic obstructive pulmonary disease, unspecified: Secondary | ICD-10-CM | POA: Diagnosis not present

## 2018-10-15 DIAGNOSIS — I5033 Acute on chronic diastolic (congestive) heart failure: Secondary | ICD-10-CM | POA: Diagnosis not present

## 2018-10-15 DIAGNOSIS — R05 Cough: Secondary | ICD-10-CM | POA: Diagnosis not present

## 2018-10-15 DIAGNOSIS — I2510 Atherosclerotic heart disease of native coronary artery without angina pectoris: Secondary | ICD-10-CM | POA: Diagnosis not present

## 2018-10-15 DIAGNOSIS — I110 Hypertensive heart disease with heart failure: Secondary | ICD-10-CM | POA: Diagnosis not present

## 2018-10-15 DIAGNOSIS — R0789 Other chest pain: Secondary | ICD-10-CM | POA: Diagnosis not present

## 2018-10-15 LAB — HEMOGLOBIN A1C: Hemoglobin A1C: 6.8

## 2018-10-16 DIAGNOSIS — K219 Gastro-esophageal reflux disease without esophagitis: Secondary | ICD-10-CM | POA: Diagnosis not present

## 2018-10-16 DIAGNOSIS — I4891 Unspecified atrial fibrillation: Secondary | ICD-10-CM | POA: Diagnosis not present

## 2018-10-16 DIAGNOSIS — I5032 Chronic diastolic (congestive) heart failure: Secondary | ICD-10-CM | POA: Diagnosis not present

## 2018-10-16 DIAGNOSIS — R079 Chest pain, unspecified: Secondary | ICD-10-CM | POA: Diagnosis not present

## 2018-10-16 DIAGNOSIS — Z951 Presence of aortocoronary bypass graft: Secondary | ICD-10-CM | POA: Diagnosis not present

## 2018-10-16 DIAGNOSIS — R0789 Other chest pain: Secondary | ICD-10-CM | POA: Diagnosis not present

## 2018-10-17 DIAGNOSIS — Z951 Presence of aortocoronary bypass graft: Secondary | ICD-10-CM | POA: Diagnosis not present

## 2018-10-17 DIAGNOSIS — K219 Gastro-esophageal reflux disease without esophagitis: Secondary | ICD-10-CM | POA: Diagnosis not present

## 2018-10-17 DIAGNOSIS — I517 Cardiomegaly: Secondary | ICD-10-CM | POA: Diagnosis not present

## 2018-10-17 DIAGNOSIS — I4891 Unspecified atrial fibrillation: Secondary | ICD-10-CM | POA: Diagnosis not present

## 2018-10-17 DIAGNOSIS — R079 Chest pain, unspecified: Secondary | ICD-10-CM | POA: Diagnosis not present

## 2018-10-17 DIAGNOSIS — R0789 Other chest pain: Secondary | ICD-10-CM | POA: Diagnosis not present

## 2018-10-17 DIAGNOSIS — I700 Atherosclerosis of aorta: Secondary | ICD-10-CM | POA: Diagnosis not present

## 2018-10-17 DIAGNOSIS — I5032 Chronic diastolic (congestive) heart failure: Secondary | ICD-10-CM | POA: Diagnosis not present

## 2018-10-17 DIAGNOSIS — J449 Chronic obstructive pulmonary disease, unspecified: Secondary | ICD-10-CM | POA: Diagnosis not present

## 2018-10-17 DIAGNOSIS — J42 Unspecified chronic bronchitis: Secondary | ICD-10-CM | POA: Diagnosis not present

## 2018-10-17 MED ORDER — ALUM & MAG HYDROXIDE-SIMETH 400-400-40 MG/5ML PO SUSP
30.00 | ORAL | Status: DC
Start: ? — End: 2018-10-17

## 2018-10-17 MED ORDER — ATORVASTATIN CALCIUM 80 MG PO TABS
80.00 | ORAL_TABLET | ORAL | Status: DC
Start: 2018-10-17 — End: 2018-10-17

## 2018-10-17 MED ORDER — ALBUTEROL SULFATE HFA 108 (90 BASE) MCG/ACT IN AERS
2.00 | INHALATION_SPRAY | RESPIRATORY_TRACT | Status: DC
Start: ? — End: 2018-10-17

## 2018-10-17 MED ORDER — FLUTICASONE FUROATE-VILANTEROL 100-25 MCG/INH IN AEPB
1.00 | INHALATION_SPRAY | RESPIRATORY_TRACT | Status: DC
Start: 2018-10-18 — End: 2018-10-17

## 2018-10-17 MED ORDER — ALPRAZOLAM 0.5 MG PO TABS
1.00 | ORAL_TABLET | ORAL | Status: DC
Start: ? — End: 2018-10-17

## 2018-10-17 MED ORDER — LISINOPRIL 5 MG PO TABS
5.00 | ORAL_TABLET | ORAL | Status: DC
Start: 2018-10-18 — End: 2018-10-17

## 2018-10-17 MED ORDER — NITROGLYCERIN 0.4 MG SL SUBL
0.40 | SUBLINGUAL_TABLET | SUBLINGUAL | Status: DC
Start: ? — End: 2018-10-17

## 2018-10-17 MED ORDER — OXYCODONE HCL 5 MG PO TABS
10.00 | ORAL_TABLET | ORAL | Status: DC
Start: ? — End: 2018-10-17

## 2018-10-17 MED ORDER — SOTALOL HCL 80 MG PO TABS
80.00 | ORAL_TABLET | ORAL | Status: DC
Start: 2018-10-17 — End: 2018-10-17

## 2018-10-17 MED ORDER — FAMOTIDINE 20 MG PO TABS
20.00 | ORAL_TABLET | ORAL | Status: DC
Start: ? — End: 2018-10-17

## 2018-10-17 MED ORDER — TAMSULOSIN HCL 0.4 MG PO CAPS
0.40 | ORAL_CAPSULE | ORAL | Status: DC
Start: 2018-10-18 — End: 2018-10-17

## 2018-10-17 MED ORDER — CITALOPRAM HYDROBROMIDE 20 MG PO TABS
20.00 | ORAL_TABLET | ORAL | Status: DC
Start: 2018-10-18 — End: 2018-10-17

## 2018-10-17 MED ORDER — ALBUTEROL SULFATE (2.5 MG/3ML) 0.083% IN NEBU
2.50 | INHALATION_SOLUTION | RESPIRATORY_TRACT | Status: DC
Start: 2018-10-17 — End: 2018-10-17

## 2018-10-17 MED ORDER — TRAZODONE HCL 50 MG PO TABS
50.00 | ORAL_TABLET | ORAL | Status: DC
Start: ? — End: 2018-10-17

## 2018-10-17 MED ORDER — GENERIC EXTERNAL MEDICATION
12.50 | Status: DC
Start: ? — End: 2018-10-17

## 2018-10-17 MED ORDER — ACETAMINOPHEN 500 MG PO TABS
1000.00 | ORAL_TABLET | ORAL | Status: DC
Start: 2018-10-17 — End: 2018-10-17

## 2018-10-17 MED ORDER — MAGNESIUM OXIDE 400 MG PO TABS
400.00 | ORAL_TABLET | ORAL | Status: DC
Start: 2018-10-18 — End: 2018-10-17

## 2018-10-17 MED ORDER — POLYETHYLENE GLYCOL 3350 17 G PO PACK
17.00 | PACK | ORAL | Status: DC
Start: ? — End: 2018-10-17

## 2018-10-17 MED ORDER — GENERIC EXTERNAL MEDICATION
2.00 | Status: DC
Start: ? — End: 2018-10-17

## 2018-10-17 MED ORDER — DEXTROSE 50 % IV SOLN
12.50 | INTRAVENOUS | Status: DC
Start: ? — End: 2018-10-17

## 2018-10-17 MED ORDER — NITROGLYCERIN 2 % TD OINT
1.00 | TOPICAL_OINTMENT | TRANSDERMAL | Status: DC
Start: 2018-10-17 — End: 2018-10-17

## 2018-10-17 MED ORDER — APIXABAN 5 MG PO TABS
5.00 | ORAL_TABLET | ORAL | Status: DC
Start: 2018-10-17 — End: 2018-10-17

## 2018-10-17 MED ORDER — ASPIRIN 81 MG PO CHEW
81.00 | CHEWABLE_TABLET | ORAL | Status: DC
Start: 2018-10-18 — End: 2018-10-17

## 2018-10-17 MED ORDER — PANTOPRAZOLE SODIUM 40 MG PO TBEC
40.00 | DELAYED_RELEASE_TABLET | ORAL | Status: DC
Start: 2018-10-17 — End: 2018-10-17

## 2018-10-17 MED ORDER — TORSEMIDE 20 MG PO TABS
100.00 | ORAL_TABLET | ORAL | Status: DC
Start: 2018-10-18 — End: 2018-10-17

## 2018-10-17 MED ORDER — UMECLIDINIUM BROMIDE 62.5 MCG/INH IN AEPB
1.00 | INHALATION_SPRAY | RESPIRATORY_TRACT | Status: DC
Start: 2018-10-18 — End: 2018-10-17

## 2018-10-17 MED ORDER — INSULIN LISPRO 100 UNIT/ML ~~LOC~~ SOLN
.00 | SUBCUTANEOUS | Status: DC
Start: 2018-10-17 — End: 2018-10-17

## 2018-10-17 MED ORDER — ISOSORBIDE MONONITRATE ER 30 MG PO TB24
60.00 | ORAL_TABLET | ORAL | Status: DC
Start: 2018-10-18 — End: 2018-10-17

## 2018-10-17 MED ORDER — ALLOPURINOL 300 MG PO TABS
300.00 | ORAL_TABLET | ORAL | Status: DC
Start: 2018-10-17 — End: 2018-10-17

## 2018-10-17 NOTE — Progress Notes (Signed)
Patient: Lawrence [name] Male    DOB: Nov 17, 1955   63 y.o.   MRN: [id number] Visit Date: 10/19/2018  Today's Provider: Trinna Post, PA-C   Chief Complaint  Patient presents with  . URI   Subjective:    URI   This is a new problem. The current episode started 1 to 4 weeks ago (for the past 3 weeks worsening for the past week). The problem has been gradually worsening. There has been no fever. Associated symptoms include congestion, coughing, ear pain (Bilateral), headaches (sometimes), nausea, a plugged ear sensation, sinus pain, sneezing and wheezing. Pertinent negatives include no chest pain, sore throat or vomiting. He has tried antihistamine, inhaler use and increased fluids for the symptoms. The treatment provided no relief.   Patient has history of COPD.     Allergies  Allergen Reactions  . Demerol [Meperidine] Hives  . Jardiance [Empagliflozin]     Perineal pain  . Prednisone Other (See Comments) and Hypertension    Pt states that this medication puts him in A-fib   . Sulfa Antibiotics Hives  . Albuterol Sulfate [Albuterol] Palpitations and Other (See Comments)    Pt currently uses this medication.    . Morphine Sulfate Nausea And Vomiting, Rash and Other (See Comments)    Pt states that he is only allergic to the tablet form of this medication.       Current Outpatient Medications:  .  albuterol (PROVENTIL HFA;VENTOLIN HFA) 108 (90 Base) MCG/ACT inhaler, Inhale 2 puffs into the lungs every 4 (four) hours as needed for wheezing or shortness of breath. Reported on 04/08/2016, Disp: 1 Inhaler, Rfl: 5 .  albuterol (PROVENTIL) (2.5 MG/3ML) 0.083% nebulizer solution, Take 3 mLs (2.5 mg total) by nebulization every 6 (six) hours as needed for wheezing or shortness of breath., Disp: 150 mL, Rfl: 1 .  allopurinol (ZYLOPRIM) 300 MG tablet, Take 1 tablet (300 mg total) by mouth 2 (two) times daily., Disp: 180 tablet, Rfl: 4 .  ALPRAZolam (XANAX) 1 MG tablet, TAKE ONE-HALF  TO ONE TABLET BY MOUTH THREE TIMES A DAY AS NEEDED, Disp: 90 tablet, Rfl: 4 .  aspirin 81 MG chewable tablet, Chew 81 mg by mouth daily., Disp: , Rfl:  .  atorvastatin (LIPITOR) 80 MG tablet, TAKE ONE TABLET BY MOUTH AT BEDTIME., Disp: 90 tablet, Rfl: 4 .  Blood Glucose Monitoring Suppl (ACCU-CHEK AVIVA PLUS) w/Device KIT, CHECK BLOOD SUGAR EVERY DAY, Disp: 1 kit, Rfl: 0 .  cetirizine (ZYRTEC) 10 MG tablet, TAKE ONE TABLET AT BEDTIME, Disp: 30 tablet, Rfl: 11 .  cyclobenzaprine (FLEXERIL) 5 MG tablet, TAKE 1 TO 2 TABLETS BY MOUTH THREE TIMESDAILY AS NEEDED (BACK PAIN), Disp: 60 tablet, Rfl: 5 .  docusate sodium (COLACE) 100 MG capsule, Take 100 mg by mouth daily. , Disp: , Rfl:  .  ELIQUIS 5 MG TABS tablet, TAKE 1 TABLET BY MOUTH TWICE A DAY., Disp: 60 tablet, Rfl: 12 .  fluticasone furoate-vilanterol (BREO ELLIPTA) 100-25 MCG/INH AEPB, Inhale 1 puff into the lungs daily., Disp: 1 each, Rfl: 2 .  glucose blood (ACCU-CHEK AVIVA PLUS) test strip, Use to check blood sugar once a day, Disp: 100 each, Rfl: 4 .  isosorbide mononitrate (IMDUR) 60 MG 24 hr tablet, Take 1 tablet (60 mg total) by mouth daily. (Patient taking differently: Take 60 mg by mouth 2 (two) times daily. ), Disp: 60 tablet, Rfl: 1 .  LINZESS 72 MCG capsule, Take 72 mcg by mouth  daily., Disp: , Rfl:  .  lisinopril (PRINIVIL,ZESTRIL) 2.5 MG tablet, TAKE 1 TABLET BY MOUTH EVERY DAY, Disp: 30 tablet, Rfl: 5 .  magnesium oxide (MAG-OX) 400 (241.3 Mg) MG tablet, TAKE 1 TABLET BY MOUTH EVERY DAY, Disp: 30 tablet, Rfl: 5 .  metFORMIN (GLUCOPHAGE) 500 MG tablet, Take 1 tablet (500 mg total) by mouth 2 (two) times daily with a meal., Disp: 60 tablet, Rfl: 11 .  nitroGLYCERIN (NITROSTAT) 0.4 MG SL tablet, Place 1 tablet (0.4 mg total) under the tongue every 5 (five) minutes as needed for chest pain. Reported on 05/26/2016, Disp: 30 tablet, Rfl: 1 .  omega-3 acid ethyl esters (LOVAZA) 1 g capsule, TAKE FOUR CAPSULES BY MOUTH DAILY, Disp: 120  capsule, Rfl: 12 .  omeprazole (PRILOSEC) 40 MG capsule, Take 1 capsule by mouth 2 (two) times daily., Disp: , Rfl:  .  oxyCODONE-acetaminophen (PERCOCET) 10-325 MG tablet, TAKE 1 TABLET BY MOUTH EVERY 4 HOURS AS NEEDED, Disp: 150 tablet, Rfl: 0 .  OXYGEN, Inhale 2 L into the lungs. Wears ATC and at night, Disp: , Rfl:  .  potassium chloride (K-DUR,KLOR-CON) 10 MEQ tablet, Take by mouth., Disp: , Rfl:  .  RANEXA 500 MG 12 hr tablet, Take 1,000 mg by mouth 2 (two) times daily., Disp: , Rfl:  .  SPIRIVA HANDIHALER 18 MCG inhalation capsule, INHALE ONE CAPSULE AS DIRECTED ONCE A DAY, Disp: 30 capsule, Rfl: 12 .  tamsulosin (FLOMAX) 0.4 MG CAPS capsule, Take 0.4 mg by mouth daily. , Disp: , Rfl:  .  torsemide (DEMADEX) 100 MG tablet, Take 0.5 tablets by mouth daily., Disp: , Rfl:  .  empagliflozin (JARDIANCE) 10 MG TABS tablet, Take 10 mg by mouth. , Disp: , Rfl:  .  levofloxacin (LEVAQUIN) 500 MG tablet, Take 1 tablet (500 mg total) by mouth daily., Disp: 5 tablet, Rfl: 0 .  ondansetron (ZOFRAN ODT) 4 MG disintegrating tablet, Take 1 tablet (4 mg total) by mouth every 8 (eight) hours as needed for nausea or vomiting. (Patient not taking: Reported on 10/18/2018), Disp: 20 tablet, Rfl: 1 .  ondansetron (ZOFRAN) 4 MG tablet, Take 1 tablet (4 mg total) by mouth every 8 (eight) hours as needed for nausea or vomiting., Disp: 20 tablet, Rfl: 0 .  predniSONE (DELTASONE) 20 MG tablet, Take 1 tablet (20 mg total) by mouth daily with breakfast for 5 days., Disp: 5 tablet, Rfl: 0  Review of Systems  Constitutional: Positive for chills. Negative for fever.  HENT: Positive for congestion, ear pain (Bilateral), postnasal drip, sinus pressure, sinus pain, sneezing and voice change. Negative for sore throat.   Respiratory: Positive for cough, chest tightness, shortness of breath and wheezing.        Patient on 2 L oxygen  Cardiovascular: Negative for chest pain, palpitations and leg swelling.  Gastrointestinal:  Positive for nausea. Negative for vomiting.  Neurological: Positive for headaches (sometimes).    Social History   Tobacco Use  . Smoking status: Former Smoker    Last attempt to quit: 04/22/2013    Years since quitting: 5.4  . Smokeless tobacco: Never Used  Substance Use Topics  . Alcohol use: No    Comment: remotely quit alcohol use. Hx of heavy alcohol use.   Objective:   BP 120/68 (BP Location: Left Arm, Patient Position: Sitting, Cuff Size: Large)   Pulse 61   Temp 98.3 F (36.8 C) (Oral)   Resp 18   Wt 254 lb (115.2 kg)   SpO2  96% Comment: with a 2 L oxygen  BMI 39.78 kg/m  Vitals:   10/18/18 0854  BP: 120/68  Pulse: 61  Resp: 18  Temp: 98.3 F (36.8 C)  TempSrc: Oral  SpO2: 96%  Weight: 254 lb (115.2 kg)     Physical Exam  Constitutional: He is oriented to person, place, and time. He appears well-developed and well-nourished.  Cardiovascular: Normal rate and regular rhythm.  Pulmonary/Chest: Effort normal. He has wheezes.  Neurological: He is alert and oriented to person, place, and time.  Skin: Skin is warm and dry.  Psychiatric: He has a normal mood and affect. His behavior is normal.        Assessment & Plan:     1. COPD exacerbation ([place])  Patient with COPD exacerbation today. Has drug interactions with azithromycin and doxycycline. Will prescribe as below as he has tolerated this before.  - predniSONE (DELTASONE) 20 MG tablet; Take 1 tablet (20 mg total) by mouth daily with breakfast for 5 days.  Dispense: 5 tablet; Refill: 0 - levofloxacin (LEVAQUIN) 500 MG tablet; Take 1 tablet (500 mg total) by mouth daily.  Dispense: 5 tablet; Refill: 0  2. Nausea  - ondansetron (ZOFRAN) 4 MG tablet; Take 1 tablet (4 mg total) by mouth every 8 (eight) hours as needed for nausea or vomiting.  Dispense: 20 tablet; Refill: 0  The entirety of the information documented in the History of Present Illness, Review of Systems and Physical Exam were personally obtained  by me. Portions of this information were initially documented by Lyndel Pleasure, CMA and reviewed by me for thoroughness and accuracy.   Return if symptoms worsen or fail to improve.           Trinna Post, PA-C  [facility]

## 2018-10-18 ENCOUNTER — Ambulatory Visit (INDEPENDENT_AMBULATORY_CARE_PROVIDER_SITE_OTHER): Payer: Medicare HMO | Admitting: Physician Assistant

## 2018-10-18 ENCOUNTER — Encounter: Payer: Self-pay | Admitting: Physician Assistant

## 2018-10-18 ENCOUNTER — Telehealth: Payer: Self-pay | Admitting: Family Medicine

## 2018-10-18 VITALS — BP 120/68 | HR 61 | Temp 98.3°F | Resp 18 | Wt 254.0 lb

## 2018-10-18 DIAGNOSIS — R110 Nausea: Secondary | ICD-10-CM | POA: Diagnosis not present

## 2018-10-18 DIAGNOSIS — J441 Chronic obstructive pulmonary disease with (acute) exacerbation: Secondary | ICD-10-CM

## 2018-10-18 MED ORDER — LISINOPRIL 5 MG PO TABS
5.00 | ORAL_TABLET | ORAL | Status: DC
Start: 2018-10-18 — End: 2018-10-18

## 2018-10-18 MED ORDER — ONDANSETRON HCL 4 MG PO TABS: 4.0000 mg | ORAL_TABLET | Freq: Three times a day (TID) | ORAL | 0 refills | Status: DC | PRN

## 2018-10-18 MED ORDER — ALPRAZOLAM 0.5 MG PO TABS
1.00 | ORAL_TABLET | ORAL | Status: DC
Start: ? — End: 2018-10-18

## 2018-10-18 MED ORDER — LEVOFLOXACIN 500 MG PO TABS: 500.0000 mg | ORAL_TABLET | Freq: Every day | ORAL | 0 refills | Status: DC

## 2018-10-18 MED ORDER — PREDNISONE 20 MG PO TABS: 20.0000 mg | ORAL_TABLET | Freq: Every day | ORAL | 0 refills | Status: AC

## 2018-10-18 MED ORDER — ASPIRIN 81 MG PO CHEW
81.00 | CHEWABLE_TABLET | ORAL | Status: DC
Start: 2018-10-18 — End: 2018-10-18

## 2018-10-18 MED ORDER — ATORVASTATIN CALCIUM 80 MG PO TABS
80.00 | ORAL_TABLET | ORAL | Status: DC
Start: 2018-10-17 — End: 2018-10-18

## 2018-10-18 MED ORDER — ALBUTEROL SULFATE HFA 108 (90 BASE) MCG/ACT IN AERS
2.00 | INHALATION_SPRAY | RESPIRATORY_TRACT | Status: DC
Start: ? — End: 2018-10-18

## 2018-10-18 MED ORDER — CITALOPRAM HYDROBROMIDE 20 MG PO TABS
20.00 | ORAL_TABLET | ORAL | Status: DC
Start: 2018-10-18 — End: 2018-10-18

## 2018-10-18 MED ORDER — ALLOPURINOL 300 MG PO TABS
300.00 | ORAL_TABLET | ORAL | Status: DC
Start: 2018-10-17 — End: 2018-10-18

## 2018-10-18 MED ORDER — AZITHROMYCIN 250 MG PO TABS: ORAL_TABLET | ORAL | 0 refills | Status: DC

## 2018-10-18 MED ORDER — ISOSORBIDE MONONITRATE ER 30 MG PO TB24
60.00 | ORAL_TABLET | ORAL | Status: DC
Start: 2018-10-18 — End: 2018-10-18

## 2018-10-18 MED ORDER — FLUTICASONE FUROATE-VILANTEROL 100-25 MCG/INH IN AEPB
1.00 | INHALATION_SPRAY | RESPIRATORY_TRACT | Status: DC
Start: 2018-10-18 — End: 2018-10-18

## 2018-10-18 NOTE — Telephone Encounter (Signed)
Called the pharmacy.

## 2018-10-18 NOTE — Patient Instructions (Signed)
Chronic Obstructive Pulmonary Disease Exacerbation  Chronic obstructive pulmonary disease (COPD) is a common lung problem. In COPD, the flow of air from the lungs is limited. COPD exacerbations are times that breathing gets worse and you need extra treatment. Without treatment they can be life threatening. If they happen often, your lungs can become more damaged. If your COPD gets worse, your doctor may treat you with:  ? Medicines.  ? Oxygen.  ? Different ways to clear your airway, such as using a mask.    Follow these instructions at home:  ? Do not smoke.  ? Avoid tobacco smoke and other things that bother your lungs.  ? If given, take your antibiotic medicine as told. Finish the medicine even if you start to feel better.  ? Only take medicines as told by your doctor.  ? Drink enough fluids to keep your pee (urine) clear or pale yellow (unless your doctor has told you not to).  ? Use a cool mist machine (vaporizer).  ? If you use oxygen or a machine that turns liquid medicine into a mist (nebulizer), continue to use them as told.  ? Keep up with shots (vaccinations) as told by your doctor.  ? Exercise regularly.  ? Eat healthy foods.  ? Keep all doctor visits as told.  Get help right away if:  ? You are very short of breath and it gets worse.  ? You have trouble talking.  ? You have bad chest pain.  ? You have blood in your spit (sputum).  ? You have a fever.  ? You keep throwing up (vomiting).  ? You feel weak, or you pass out (faint).  ? You feel confused.  ? You keep getting worse.  This information is not intended to replace advice given to you by your health care provider. Make sure you discuss any questions you have with your health care provider.  Document Released: 10/22/2011 Document Revised: 04/09/2016 Document Reviewed: 07/07/2013  Elsevier Interactive Patient Education ? 2017 [employer]

## 2018-10-18 NOTE — Telephone Encounter (Signed)
[place] [phone].  Has questions on 2 antibiotics that was sent over by [facility] for refill.  Please advise asap  Thanks, [place]

## 2018-10-20 ENCOUNTER — Ambulatory Visit: Payer: Self-pay | Admitting: Internal Medicine

## 2018-10-20 ENCOUNTER — Other Ambulatory Visit: Payer: Self-pay | Admitting: Family Medicine

## 2018-10-28 ENCOUNTER — Encounter: Payer: Self-pay | Admitting: Family Medicine

## 2018-10-28 ENCOUNTER — Other Ambulatory Visit: Payer: Self-pay | Admitting: Family

## 2018-10-28 ENCOUNTER — Ambulatory Visit (INDEPENDENT_AMBULATORY_CARE_PROVIDER_SITE_OTHER): Payer: Medicare HMO | Admitting: Family Medicine

## 2018-10-28 VITALS — BP 163/98 | HR 67 | Temp 97.6°F | Resp 18 | Wt 255.4 lb

## 2018-10-28 DIAGNOSIS — R079 Chest pain, unspecified: Secondary | ICD-10-CM

## 2018-10-28 DIAGNOSIS — E1121 Type 2 diabetes mellitus with diabetic nephropathy: Secondary | ICD-10-CM | POA: Diagnosis not present

## 2018-10-28 DIAGNOSIS — J441 Chronic obstructive pulmonary disease with (acute) exacerbation: Secondary | ICD-10-CM | POA: Diagnosis not present

## 2018-10-28 DIAGNOSIS — I10 Essential (primary) hypertension: Secondary | ICD-10-CM

## 2018-10-28 DIAGNOSIS — E1159 Type 2 diabetes mellitus with other circulatory complications: Secondary | ICD-10-CM | POA: Diagnosis not present

## 2018-10-28 LAB — POCT UA - MICROALBUMIN: Microalbumin Ur, POC: 50 mg/L

## 2018-10-28 MED ORDER — DAPAGLIFLOZIN PROPANEDIOL 5 MG PO TABS: 5.0000 mg | ORAL_TABLET | Freq: Every day | ORAL | 0 refills | Status: DC

## 2018-10-28 NOTE — Patient Instructions (Signed)
   Start samples Farxiga 13m one tablet every morning.

## 2018-10-28 NOTE — Progress Notes (Signed)
Patient: Lawrence [name] Male    DOB: [birth year]   62 y.o.   MRN: [id number] Visit Date: 10/28/2018  Today's Provider: [name], MD   Chief Complaint  Patient presents with  . Hospitalization Follow-up   Subjective:    HPI  Follow up Hospitalization  Patient was admitted to [facility] on 10/15/2018 and discharged on 10/17/2018. He was treated for chest pain, bronchitis, and CAD. Patient is also on continuous oxygen 2 liters. Treatment for this included EKG and chest x-ray.  Had one minimally elevated troponin=0.080, three undetectable troponins.   Pain was thought to be GI and advised to follow up with [facility].  He reports good compliance with treatment. He reports this condition is Improved. Denies any chest pain since discharge. Is planning on scheduling follow up at GI in the next few weeks and reports he already has follow up appointment scheduled with his cardiologist Dr. [name].   He was also seen last week for COPD exacerbation and treated with prednisone and levofloxacin. He states breathing is much better today and feels  ------------------------------------------------------------------------------------      Allergies  Allergen Reactions  . Demerol [Meperidine] Hives  . Jardiance [Empagliflozin]     Perineal pain  . Prednisone Other (See Comments) and Hypertension    Pt states that this medication puts him in A-fib   . Sulfa Antibiotics Hives  . Albuterol Sulfate [Albuterol] Palpitations and Other (See Comments)    Pt currently uses this medication.    . Morphine Sulfate Nausea And Vomiting, Rash and Other (See Comments)    Pt states that he is only allergic to the tablet form of this medication.       Current Outpatient Medications:  .  albuterol (PROVENTIL HFA;VENTOLIN HFA) 108 (90 Base) MCG/ACT inhaler, Inhale 2 puffs into the lungs every 4 (four) hours as needed for wheezing or shortness of breath. Reported on 04/08/2016, Disp: 1 Inhaler,  Rfl: 5 .  albuterol (PROVENTIL) (2.5 MG/3ML) 0.083% nebulizer solution, Take 3 mLs (2.5 mg total) by nebulization every 6 (six) hours as needed for wheezing or shortness of breath., Disp: 150 mL, Rfl: 1 .  allopurinol (ZYLOPRIM) 300 MG tablet, Take 1 tablet (300 mg total) by mouth 2 (two) times daily., Disp: 180 tablet, Rfl: 4 .  ALPRAZolam (XANAX) 1 MG tablet, TAKE ONE-HALF TO ONE TABLET BY MOUTH THREE TIMES A DAY AS NEEDED, Disp: 90 tablet, Rfl: 4 .  aspirin 81 MG chewable tablet, Chew 81 mg by mouth daily., Disp: , Rfl:  .  atorvastatin (LIPITOR) 80 MG tablet, TAKE ONE TABLET BY MOUTH AT BEDTIME., Disp: 90 tablet, Rfl: 4 .  Blood Glucose Monitoring Suppl (ACCU-CHEK AVIVA PLUS) w/Device KIT, CHECK BLOOD SUGAR EVERY DAY, Disp: 1 kit, Rfl: 0 .  cetirizine (ZYRTEC) 10 MG tablet, TAKE ONE TABLET AT BEDTIME, Disp: 30 tablet, Rfl: 11 .  cyclobenzaprine (FLEXERIL) 5 MG tablet, TAKE 1 TO 2 TABLETS BY MOUTH THREE TIMESDAILY AS NEEDED (BACK PAIN), Disp: 60 tablet, Rfl: 5 .  docusate sodium (COLACE) 100 MG capsule, Take 100 mg by mouth daily. , Disp: , Rfl:  .  ELIQUIS 5 MG TABS tablet, TAKE 1 TABLET BY MOUTH TWICE A DAY., Disp: 60 tablet, Rfl: 12 .  fluticasone furoate-vilanterol (BREO ELLIPTA) 100-25 MCG/INH AEPB, Inhale 1 puff into the lungs daily., Disp: 1 each, Rfl: 2 .  glucose blood (ACCU-CHEK AVIVA PLUS) test strip, Use to check blood sugar once a day, Disp:  100 each, Rfl: 4 .  isosorbide mononitrate (IMDUR) 60 MG 24 hr tablet, Take 1 tablet (60 mg total) by mouth daily. (Patient taking differently: Take 60 mg by mouth 2 (two) times daily. ), Disp: 60 tablet, Rfl: 1 .  levofloxacin (LEVAQUIN) 500 MG tablet, Take 1 tablet (500 mg total) by mouth daily., Disp: 5 tablet, Rfl: 0 .  LINZESS 72 MCG capsule, Take 72 mcg by mouth daily., Disp: , Rfl:  .  lisinopril (PRINIVIL,ZESTRIL) 2.5 MG tablet, TAKE 1 TABLET BY MOUTH DAILY, Disp: 30 tablet, Rfl: 11 .  magnesium oxide (MAG-OX) 400 (241.3 Mg) MG tablet,  TAKE 1 TABLET BY MOUTH DAILY, Disp: 30 tablet, Rfl: 5 .  metFORMIN (GLUCOPHAGE) 500 MG tablet, Take 1 tablet (500 mg total) by mouth 2 (two) times daily with a meal., Disp: 60 tablet, Rfl: 11 .  nitroGLYCERIN (NITROSTAT) 0.4 MG SL tablet, Place 1 tablet (0.4 mg total) under the tongue every 5 (five) minutes as needed for chest pain. Reported on 05/26/2016, Disp: 30 tablet, Rfl: 1 .  omega-3 acid ethyl esters (LOVAZA) 1 g capsule, TAKE FOUR CAPSULES BY MOUTH DAILY, Disp: 120 capsule, Rfl: 12 .  omeprazole (PRILOSEC) 40 MG capsule, Take 1 capsule by mouth 2 (two) times daily., Disp: , Rfl:  .  ondansetron (ZOFRAN ODT) 4 MG disintegrating tablet, Take 1 tablet (4 mg total) by mouth every 8 (eight) hours as needed for nausea or vomiting., Disp: 20 tablet, Rfl: 1 .  ondansetron (ZOFRAN) 4 MG tablet, Take 1 tablet (4 mg total) by mouth every 8 (eight) hours as needed for nausea or vomiting., Disp: 20 tablet, Rfl: 0 .  oxyCODONE-acetaminophen (PERCOCET) 10-325 MG tablet, TAKE 1 TABLET BY MOUTH EVERY 4 HOURS AS NEEDED, Disp: 150 tablet, Rfl: 0 .  OXYGEN, Inhale 2 L into the lungs. Wears ATC and at night, Disp: , Rfl:  .  potassium chloride (K-DUR,KLOR-CON) 10 MEQ tablet, Take by mouth., Disp: , Rfl:  .  RANEXA 500 MG 12 hr tablet, Take 1,000 mg by mouth 2 (two) times daily., Disp: , Rfl:  .  sotalol (BETAPACE) 80 MG tablet, , Disp: , Rfl:  .  SPIRIVA HANDIHALER 18 MCG inhalation capsule, INHALE ONE CAPSULE AS DIRECTED ONCE A DAY, Disp: 30 capsule, Rfl: 12 .  tamsulosin (FLOMAX) 0.4 MG CAPS capsule, Take 0.4 mg by mouth daily. , Disp: , Rfl:  .  torsemide (DEMADEX) 100 MG tablet, Take 0.5 tablets by mouth daily., Disp: , Rfl:  .  empagliflozin (JARDIANCE) 10 MG TABS tablet, Take 10 mg by mouth. , Disp: , Rfl:   Review of Systems  Constitutional: Negative.   HENT: Negative.   Respiratory: Positive for shortness of breath.   Cardiovascular: Negative.   Gastrointestinal: Negative.   Neurological: Negative.      Social History   Tobacco Use  . Smoking status: Former Smoker    Last attempt to quit: 04/22/2013    Years since quitting: 5.5  . Smokeless tobacco: Never Used  Substance Use Topics  . Alcohol use: No    Comment: remotely quit alcohol use. Hx of heavy alcohol use.      Objective:   BP (!) 163/98 (BP Location: Left Arm, Patient Position: Sitting, Cuff Size: Large)   Pulse 67   Temp 97.6 F (36.4 C) (Oral)   Resp 18   Wt 255 lb 6.4 oz (115.8 kg)   SpO2 96% Comment: 2 liters of oxygen  BMI 40.00 kg/m  Vitals:   10/28/18  1448  BP: (!) 163/98  Pulse: 67  Resp: 18  Temp: 97.6 F (36.4 C)  TempSrc: Oral  SpO2: 96%  Weight: 255 lb 6.4 oz (115.8 kg)     Physical Exam   General Appearance:    Alert, cooperative, no distress  Eyes:    PERRL, conjunctiva/corneas clear, EOM's intact       Lungs:     Clear to auscultation bilaterally, respirations unlabored  Heart:    Regular rate and rhythm  Neurologic:   Awake, alert, oriented x 3. No apparent focal neurological           defect.   Ext:   2+ bipedal edema.     Results for orders placed or performed in visit on 10/28/18  POCT UA - Microalbumin  Result Value Ref Range   Microalbumin Ur, POC 50 mg/L   Creatinine, POC     Albumin/Creatinine Ratio, Urine, POC         Assessment & Plan    1. Chest pain, unspecified type Ruled out for MI at recent hospitalization. Likely secondary to GERD. No cp since discharge. Continue BID PPI. Follow up cardiology as scheduled. And to call [facility] for follow up.   2. COPD exacerbation ([place]) Clinically resolved after finishing prednisone and levofloxacin.   3. Essential hypertension SBP elevated. He has been off of Jardiance due to it upsetting his stomach. Expect SBP to improve on new SGLT inhibitor.   4. Type 2 diabetes mellitus with other circulatory complication, without long-term current use of insulin ([place]) Off Jardiance due to nausea try samples Farxiga 44m once a day.    Follow up in January as scheduled. For BP check. [facility]  - POCT UA - Microalbumin  5. Type 2 diabetes mellitus with diabetic nephropathy, without long-term current use of insulin ([facility])  - POCT UA - Microalbumin     DLelon Huh MD  [facility]

## 2018-11-03 ENCOUNTER — Other Ambulatory Visit: Payer: Self-pay

## 2018-11-03 DIAGNOSIS — M5410 Radiculopathy, site unspecified: Secondary | ICD-10-CM

## 2018-11-03 NOTE — Telephone Encounter (Signed)
Patient called office requesting refill on Oxycodone sent to [facility]. KW

## 2018-11-04 ENCOUNTER — Other Ambulatory Visit: Payer: Self-pay | Admitting: Family Medicine

## 2018-11-04 DIAGNOSIS — I480 Paroxysmal atrial fibrillation: Secondary | ICD-10-CM | POA: Diagnosis not present

## 2018-11-04 DIAGNOSIS — E782 Mixed hyperlipidemia: Secondary | ICD-10-CM | POA: Diagnosis not present

## 2018-11-04 DIAGNOSIS — I10 Essential (primary) hypertension: Secondary | ICD-10-CM | POA: Diagnosis not present

## 2018-11-04 DIAGNOSIS — I25118 Atherosclerotic heart disease of native coronary artery with other forms of angina pectoris: Secondary | ICD-10-CM | POA: Diagnosis not present

## 2018-11-04 DIAGNOSIS — J418 Mixed simple and mucopurulent chronic bronchitis: Secondary | ICD-10-CM | POA: Diagnosis not present

## 2018-11-04 MED ORDER — NITROGLYCERIN 0.4 MG SL SUBL: 0.4000 mg | SUBLINGUAL_TABLET | SUBLINGUAL | 3 refills | Status: DC | PRN

## 2018-11-04 MED ORDER — OXYCODONE-ACETAMINOPHEN 10-325 MG PO TABS: 1.0000 | ORAL_TABLET | ORAL | 0 refills | Status: DC | PRN

## 2018-11-04 NOTE — Telephone Encounter (Signed)
Patient needs refill on NTG tabs.  He states he needs these today.

## 2018-11-04 NOTE — Telephone Encounter (Signed)
Please advise 

## 2018-11-10 ENCOUNTER — Emergency Department: Payer: Medicare HMO

## 2018-11-10 ENCOUNTER — Encounter: Payer: Self-pay | Admitting: Emergency Medicine

## 2018-11-10 ENCOUNTER — Other Ambulatory Visit: Payer: Self-pay

## 2018-11-10 DIAGNOSIS — J189 Pneumonia, unspecified organism: Secondary | ICD-10-CM | POA: Diagnosis not present

## 2018-11-10 DIAGNOSIS — R0602 Shortness of breath: Secondary | ICD-10-CM | POA: Diagnosis not present

## 2018-11-10 DIAGNOSIS — J449 Chronic obstructive pulmonary disease, unspecified: Secondary | ICD-10-CM | POA: Diagnosis not present

## 2018-11-10 DIAGNOSIS — Z7901 Long term (current) use of anticoagulants: Secondary | ICD-10-CM | POA: Insufficient documentation

## 2018-11-10 DIAGNOSIS — R05 Cough: Secondary | ICD-10-CM | POA: Diagnosis not present

## 2018-11-10 DIAGNOSIS — I130 Hypertensive heart and chronic kidney disease with heart failure and stage 1 through stage 4 chronic kidney disease, or unspecified chronic kidney disease: Secondary | ICD-10-CM | POA: Diagnosis not present

## 2018-11-10 DIAGNOSIS — N182 Chronic kidney disease, stage 2 (mild): Secondary | ICD-10-CM | POA: Insufficient documentation

## 2018-11-10 DIAGNOSIS — E1122 Type 2 diabetes mellitus with diabetic chronic kidney disease: Secondary | ICD-10-CM | POA: Insufficient documentation

## 2018-11-10 DIAGNOSIS — R0789 Other chest pain: Secondary | ICD-10-CM | POA: Insufficient documentation

## 2018-11-10 DIAGNOSIS — Z87891 Personal history of nicotine dependence: Secondary | ICD-10-CM | POA: Insufficient documentation

## 2018-11-10 DIAGNOSIS — I5032 Chronic diastolic (congestive) heart failure: Secondary | ICD-10-CM | POA: Insufficient documentation

## 2018-11-10 DIAGNOSIS — Z79899 Other long term (current) drug therapy: Secondary | ICD-10-CM | POA: Insufficient documentation

## 2018-11-10 DIAGNOSIS — Z7984 Long term (current) use of oral hypoglycemic drugs: Secondary | ICD-10-CM | POA: Diagnosis not present

## 2018-11-10 DIAGNOSIS — R079 Chest pain, unspecified: Secondary | ICD-10-CM | POA: Diagnosis not present

## 2018-11-10 LAB — CBC
HCT: 49.2 % (ref 39.0–52.0)
Hemoglobin: 15.5 g/dL (ref 13.0–17.0)
MCH: 30.1 pg (ref 26.0–34.0)
MCHC: 31.5 g/dL (ref 30.0–36.0)
MCV: 95.5 fL (ref 80.0–100.0)
Platelets: 172 10*3/uL (ref 150–400)
RBC: 5.15 MIL/uL (ref 4.22–5.81)
RDW: 13.9 % (ref 11.5–15.5)
WBC: 5.7 10*3/uL (ref 4.0–10.5)
nRBC: 0 % (ref 0.0–0.2)

## 2018-11-10 NOTE — ED Triage Notes (Signed)
Pt here with c/o shob that began 2 days ago, states left sided cp as well with rest. Productive cough "greenish looking," threw up "black looking vomit"  the day before christmas but vomiting stopped with nausea pill. Able to talk in complete sentences, wears 2L O2 at home. NAD at this time.

## 2018-11-11 ENCOUNTER — Emergency Department
Admission: EM | Admit: 2018-11-11 | Discharge: 2018-11-11 | Disposition: A | Discharge status: Home / Self Care | Payer: Medicare HMO | Attending: Emergency Medicine | Admitting: Emergency Medicine

## 2018-11-11 DIAGNOSIS — I10 Essential (primary) hypertension: Secondary | ICD-10-CM | POA: Diagnosis not present

## 2018-11-11 DIAGNOSIS — J181 Lobar pneumonia, unspecified organism: Secondary | ICD-10-CM

## 2018-11-11 DIAGNOSIS — R05 Cough: Secondary | ICD-10-CM | POA: Diagnosis not present

## 2018-11-11 DIAGNOSIS — J189 Pneumonia, unspecified organism: Secondary | ICD-10-CM | POA: Diagnosis not present

## 2018-11-11 DIAGNOSIS — J449 Chronic obstructive pulmonary disease, unspecified: Secondary | ICD-10-CM | POA: Diagnosis not present

## 2018-11-11 DIAGNOSIS — J439 Emphysema, unspecified: Secondary | ICD-10-CM | POA: Diagnosis not present

## 2018-11-11 DIAGNOSIS — G4710 Hypersomnia, unspecified: Secondary | ICD-10-CM | POA: Diagnosis not present

## 2018-11-11 DIAGNOSIS — R0789 Other chest pain: Secondary | ICD-10-CM

## 2018-11-11 DIAGNOSIS — R079 Chest pain, unspecified: Secondary | ICD-10-CM | POA: Diagnosis not present

## 2018-11-11 DIAGNOSIS — R0602 Shortness of breath: Secondary | ICD-10-CM | POA: Diagnosis not present

## 2018-11-11 LAB — TROPONIN I
Troponin I: <0.03 ng/mL (ref <0.03)
Troponin I: <0.03 ng/mL (ref <0.03)

## 2018-11-11 LAB — INFLUENZA PANEL BY PCR (TYPE A & B)
Influenza A By PCR: NEGATIVE
Influenza B By PCR: NEGATIVE

## 2018-11-11 LAB — HEPATIC FUNCTION PANEL
ALT: 28 U/L (ref 0–44)
AST: 16 U/L (ref 15–41)
Albumin: 3.6 g/dL (ref 3.5–5.0)
Alkaline Phosphatase: 65 U/L (ref 38–126)
Bilirubin, Direct: <0.1 mg/dL (ref 0.0–0.2)
Total Bilirubin: 0.6 mg/dL (ref 0.3–1.2)
Total Protein: 6.5 g/dL (ref 6.5–8.1)

## 2018-11-11 LAB — BASIC METABOLIC PANEL
Anion gap: 7 (ref 5–15)
BUN: 18 mg/dL (ref 8–23)
CO2: 31 mmol/L (ref 22–32)
Calcium: 9.1 mg/dL (ref 8.9–10.3)
Chloride: 101 mmol/L (ref 98–111)
Creatinine, Ser: 1.24 mg/dL (ref 0.61–1.24)
GFR calc Af Amer: >60 mL/min (ref >60)
GFR calc non Af Amer: >60 mL/min (ref >60)
Glucose, Bld: 183 mg/dL — ABNORMAL HIGH (ref 70–99)
Potassium: 4.8 mmol/L (ref 3.5–5.1)
Sodium: 139 mmol/L (ref 135–145)

## 2018-11-11 LAB — ETHANOL: Alcohol, Ethyl (B): <10 mg/dL (ref <10)

## 2018-11-11 LAB — LIPASE, BLOOD: Lipase: 29 U/L (ref 11–51)

## 2018-11-11 MED ORDER — MORPHINE SULFATE (PF) 4 MG/ML IV SOLN
4.0000 mg | Freq: Once | INTRAVENOUS | Status: AC
  Administered 2018-11-11: 4 mg via INTRAVENOUS
  Filled 2018-11-11: qty 1

## 2018-11-11 MED ORDER — SODIUM CHLORIDE 0.9 % IV SOLN
500.0000 mg | Freq: Once | INTRAVENOUS | Status: AC
  Administered 2018-11-11: 500 mg via INTRAVENOUS
  Filled 2018-11-11: qty 500

## 2018-11-11 MED ORDER — ONDANSETRON HCL 4 MG/2ML IJ SOLN
4.0000 mg | Freq: Once | INTRAMUSCULAR | Status: AC
  Administered 2018-11-11: 4 mg via INTRAVENOUS
  Filled 2018-11-11: qty 2

## 2018-11-11 MED ORDER — HYDROCOD POLST-CPM POLST ER 10-8 MG/5ML PO SUER: 5.0000 mL | Freq: Two times a day (BID) | ORAL | 0 refills | Status: DC

## 2018-11-11 MED ORDER — AMOXICILLIN-POT CLAVULANATE 875-125 MG PO TABS: 1.0000 | ORAL_TABLET | Freq: Two times a day (BID) | ORAL | 0 refills | Status: DC

## 2018-11-11 MED ORDER — SODIUM CHLORIDE 0.9 % IV SOLN
1.0000 g | Freq: Once | INTRAVENOUS | Status: AC
  Administered 2018-11-11: 1 g via INTRAVENOUS
  Filled 2018-11-11: qty 10

## 2018-11-11 MED ORDER — AZITHROMYCIN 250 MG PO TABS: 250.0000 mg | ORAL_TABLET | Freq: Every day | ORAL | 0 refills | Status: DC

## 2018-11-11 NOTE — Discharge Instructions (Addendum)
1.  Take antibiotics as prescribed: Augmentin 875 mg twice daily x7 days Azithromycin 250 mg daily x4 days 2.  You may take cough medicine as needed (Tussionex). 3.  Return to the ER for worsening symptoms, persistent vomiting, difficulty breathing or other concerns.

## 2018-11-11 NOTE — ED Notes (Signed)
ED Provider at bedside. 

## 2018-11-11 NOTE — ED Notes (Signed)
Reviewed discharge instructions, follow-up care, and prescriptions with patient. Patient verbalized understanding of all information reviewed. Patient stable, with no distress noted at this time.    

## 2018-11-11 NOTE — ED Provider Notes (Signed)
[facility] Emergency Department Provider Note   ____________________________________________   First MD Initiated Contact with Patient 11/11/18 [phone]     (approximate)  I have reviewed the triage vital signs and the nursing notes.   HISTORY  Chief Complaint Shortness of Breath and Chest Pain    HPI Lawrence [name] is a 62 y.o. male who presents to the ED from home with a chief complaint of cough, chest pain and shortness of breath.  Patient has a history of COPD on 2 L continuous oxygen.  Also with a history of atrial fibrillation on Eliquis.  Reports a 2-day history of cough, initially productive of "black phlegm", threw up "black-looking vomit", chest pain and shortness of breath.  States other than the initial vomiting and coughing of black-looking substance, has had no further episodes and denies black or tarry stools.  Denies associated fever, chills, abdominal pain, diarrhea.  Denies recent travel or trauma.   Past Medical History:  Diagnosis Date  . A-fib ([place])   . Anemia   . Anginal pain ([place])   . Anxiety   . Arthritis   . Asthma   . CAD (coronary artery disease)    a. 2002 CABGx2 (LIMA->LAD, VG->VG->OM1);  b. 09/2012 DES->OM;  c. 03/2015 PTCA of LAD [facility]) in setting of atretic LIMA; d. 05/2015 Cath [facility]): nonobs dzs; e. 06/2015 Cath ([facility]): LM nl, LAD 45p/d ISR, 50d, D1/2 small, LCX 50p/d ISR, OM1 70ost, 30 ISR, VG->OM1 50ost, 39m LIMA->LAD 99p/d - atretic, RCA dom, nl; f.cath 10/16: 40-50%(FFR 0.90) pLAD, 75% (FFR 0.77) mLAD s/p PCI/DES, oRCA 40% (FFR0.95)  . Celiac disease   . Chronic diastolic CHF (congestive heart failure) ([place]    a. 06/2009 Echo: EF 60-65%, Gr 1 DD, triv AI, mildly dil LA, nl RV.  .Marland KitchenCOPD (chronic obstructive pulmonary disease) ([place]    a. Chronic bronchitis and emphysema.  . Diverticulosis   . Dysrhythmia   . Essential hypertension   . GERD (gastroesophageal reflux disease)   . History of tobacco abuse    a. Quit 2014.    .Marland KitchenPancreatitis   . PSVT (paroxysmal supraventricular tachycardia) ([place]    a. 10/2012 Noted on Zio Patch.  . Tubular adenoma of colon   . Type II diabetes mellitus ([facility]     Patient Active Problem List   Diagnosis Date Noted  . Eye pain, left 08/18/2018  . Acute on chronic heart failure ([place] 04/18/2018  . History of adenomatous polyp of colon 04/05/2018  . Abdominal pain, chronic, epigastric 11/06/2017  . Bilateral flank pain 03/24/2017  . Dyspnea 04/03/2016  . Hypotension 04/03/2016  . CKD (chronic kidney disease) stage 2, GFR 60-89 ml/min 04/03/2016  . Anemia 04/03/2016  . Paroxysmal atrial fibrillation ([place] 12/23/2015  . OSA (obstructive sleep apnea) 12/10/2015  . Left inguinal hernia 11/07/2015  . Anxiety 11/07/2015  . Unstable angina ([place] 10/05/2015  . Back pain with left-sided radiculopathy 09/30/2015  . Nocturnal hypoxia 09/06/2015  . BPH (benign prostatic hyperplasia) 08/01/2015  . Chronic diastolic CHF (congestive heart failure) ([place]ort Pe[place]   . Angina pectoris ([place]   . Chest pain 07/11/2015  . COPD (chronic obstructive pulmonary disease) ([place] 07/03/2015  . CAD (coronary artery disease) 06/26/2015  . HTN (hypertension) 06/26/2015  . 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[place] 06/26/2015  . Achalasia 07/24/2014  . GERD (gastroesophageal reflux disease) 06/07/2014  . Former tobacco use 04/11/2013  . HLD (hyperlipidemia) 04/09/2013    Past Surgical History:  Procedure Laterality Date  .  BYPASS GRAFT    . CARDIAC CATHETERIZATION N/A 07/12/2015   rocedure: Left Heart Cath and Cors/Grafts Angiography;  Surgeon: Belva Crome, MD;  Location: [place] CV LAB;  Service: Cardiovascular;  Laterality: N/A;  . CARDIAC CATHETERIZATION Right 10/07/2015   Procedure: Left Heart Cath and Cors/Grafts Angiography;  Surgeon: Dionisio David, MD;  Location: [place] [facility];  Service: Cardiovascular;  Laterality: Right;  . CARDIAC CATHETERIZATION N/A 04/06/2016    Procedure: Left Heart Cath and Coronary Angiography;  Surgeon: Yolonda Kida, MD;  Location: [place] [facility];  Service: Cardiovascular;  Laterality: N/A;  . CARDIAC CATHETERIZATION  04/06/2016   Procedure: Bypass Graft Angiography;  Surgeon: Yolonda Kida, MD;  Location: [place] [facility];  Service: Cardiovascular;;  . CARDIAC CATHETERIZATION N/A 11/02/2016   Procedure: Left Heart Cath and Cors/Grafts Angiography and possible PCI;  Surgeon: Yolonda Kida, MD;  Location: [place] [facility];  Service: Cardiovascular;  Laterality: N/A;  . CARDIAC CATHETERIZATION N/A 11/02/2016   Procedure: Coronary Stent Intervention;  Surgeon: Yolonda Kida, MD;  Location: [place] [facility];  Service: Cardiovascular;  Laterality: N/A;  . CHOLECYSTECTOMY    . COLONOSCOPY WITH PROPOFOL N/A 04/01/2018   Procedure: COLONOSCOPY WITH PROPOFOL;  Surgeon: Manya Silvas, MD;  Location: [facility] ENDOSCOPY;  Service: Endoscopy;  Laterality: N/A;  . ESOPHAGEAL DILATION    . ESOPHAGOGASTRODUODENOSCOPY (EGD) WITH PROPOFOL N/A 04/01/2018   Procedure: ESOPHAGOGASTRODUODENOSCOPY (EGD) WITH PROPOFOL;  Surgeon: Manya Silvas, MD;  Location: [facility] ENDOSCOPY;  Service: Endoscopy;  Laterality: N/A;  . TONSILLECTOMY    . VASCULAR SURGERY      Prior to Admission medications   Medication Sig Start Date End Date Taking? Authorizing Provider  albuterol (PROVENTIL HFA;VENTOLIN HFA) 108 (90 Base) MCG/ACT inhaler Inhale 2 puffs into the lungs every 4 (four) hours as needed for wheezing or shortness of breath. Reported on 04/08/2016 10/21/17   Birdie Sons, MD  albuterol (PROVENTIL) (2.5 MG/3ML) 0.083% nebulizer solution Take 3 mLs (2.5 mg total) by nebulization every 6 (six) hours as needed for wheezing or shortness of breath. 01/04/18   Birdie Sons, MD  allopurinol (ZYLOPRIM) 300 MG tablet Take 1 tablet (300 mg total) by mouth 2 (two) times daily. 10/05/18   Birdie Sons, MD  ALPRAZolam Duanne Moron) 1 MG  tablet TAKE ONE-HALF TO ONE TABLET BY MOUTH THREE TIMES A DAY AS NEEDED 10/06/18   Birdie Sons, MD  amoxicillin-clavulanate (AUGMENTIN) 875-125 MG tablet Take 1 tablet by mouth 2 (two) times daily. 11/11/18   Paulette Blanch, MD  aspirin 81 MG chewable tablet Chew 81 mg by mouth daily.    [provider]  atorvastatin (LIPITOR) 80 MG tablet TAKE ONE TABLET BY MOUTH AT BEDTIME. 09/19/18   Birdie Sons, MD  azithromycin (ZITHROMAX) 250 MG tablet Take 1 tablet (250 mg total) by mouth daily. 11/11/18   Paulette Blanch, MD  Blood Glucose Monitoring Suppl (ACCU-CHEK AVIVA PLUS) w/Device KIT CHECK BLOOD SUGAR EVERY DAY 12/08/17   Birdie Sons, MD  cetirizine (ZYRTEC) 10 MG tablet TAKE ONE TABLET AT BEDTIME 07/11/18   Birdie Sons, MD  chlorpheniramine-HYDROcodone (TUSSIONEX PENNKINETIC ER) 10-8 MG/5ML SUER Take 5 mLs by mouth 2 (two) times daily. 11/11/18   Paulette Blanch, MD  cyclobenzaprine (FLEXERIL) 5 MG tablet TAKE 1 TO 2 TABLETS BY MOUTH THREE TIMESDAILY AS NEEDED (BACK PAIN) 05/24/18   Birdie Sons, MD  dapagliflozin propanediol (FARXIGA) 5 MG  TABS tablet Take 5 mg by mouth daily. 10/28/18   Birdie Sons, MD  docusate sodium (COLACE) 100 MG capsule Take 100 mg by mouth daily.     [provider]  ELIQUIS 5 MG TABS tablet TAKE 1 TABLET BY MOUTH TWICE A DAY. 08/06/18   Birdie Sons, MD  fluticasone furoate-vilanterol (BREO ELLIPTA) 100-25 MCG/INH AEPB Inhale 1 puff into the lungs daily. 08/19/18   Devona Konig A, MD  glucose blood (ACCU-CHEK AVIVA PLUS) test strip Use to check blood sugar once a day 02/14/18   Birdie Sons, MD  isosorbide mononitrate (IMDUR) 60 MG 24 hr tablet Take 1 tablet (60 mg total) by mouth daily. Patient taking differently: Take 60 mg by mouth 2 (two) times daily.  11/04/16   Henreitta Leber, MD  levofloxacin (LEVAQUIN) 500 MG tablet Take 1 tablet (500 mg total) by mouth daily. 10/18/18   Trinna Post, PA-C  LINZESS 72 MCG capsule Take 72  mcg by mouth daily.    [provider]  lisinopril (PRINIVIL,ZESTRIL) 2.5 MG tablet TAKE 1 TABLET BY MOUTH DAILY 10/20/18   Birdie Sons, MD  magnesium oxide (MAG-OX) 400 (241.3 Mg) MG tablet TAKE 1 TABLET BY MOUTH DAILY 10/28/18   Alisa Graff, FNP  metFORMIN (GLUCOPHAGE) 500 MG tablet Take 1 tablet (500 mg total) by mouth 2 (two) times daily with a meal. 06/15/18   Birdie Sons, MD  nitroGLYCERIN (NITROSTAT) 0.4 MG SL tablet Place 1 tablet (0.4 mg total) under the tongue every 5 (five) minutes as needed for chest pain. Reported on 05/26/2016 11/04/18   Birdie Sons, MD  omega-3 acid ethyl esters (LOVAZA) 1 g capsule TAKE FOUR CAPSULES BY MOUTH DAILY 06/24/18   Birdie Sons, MD  omeprazole (PRILOSEC) 40 MG capsule Take 1 capsule by mouth 2 (two) times daily. 04/22/18 04/22/19  [provider]  ondansetron (ZOFRAN ODT) 4 MG disintegrating tablet Take 1 tablet (4 mg total) by mouth every 8 (eight) hours as needed for nausea or vomiting. 04/30/18   Earleen Newport, MD  ondansetron (ZOFRAN) 4 MG tablet Take 1 tablet (4 mg total) by mouth every 8 (eight) hours as needed for nausea or vomiting. 10/18/18   Trinna Post, PA-C  oxyCODONE-acetaminophen (PERCOCET) 10-325 MG tablet Take 1 tablet by mouth every 4 (four) hours as needed. 11/04/18   Birdie Sons, MD  OXYGEN Inhale 2 L into the lungs. Wears ATC and at night    [provider]  potassium chloride (K-DUR,KLOR-CON) 10 MEQ tablet Take by mouth.    [provider]  RANEXA 500 MG 12 hr tablet Take 1,000 mg by mouth 2 (two) times daily.    [provider]  sotalol (BETAPACE) 80 MG tablet  10/05/18   [provider]  SPIRIVA HANDIHALER 18 MCG inhalation capsule INHALE ONE CAPSULE AS DIRECTED ONCE A DAY 02/03/18   Birdie Sons, MD  tamsulosin (FLOMAX) 0.4 MG CAPS capsule Take 0.4 mg by mouth daily.     [provider]  torsemide (DEMADEX) 100 MG tablet Take 0.5 tablets  by mouth daily. 10/11/17   [provider]    Allergies Demerol [meperidine]; Jardiance [empagliflozin]; Prednisone; Sulfa antibiotics; Albuterol sulfate [albuterol]; and Morphine sulfate  Family History  Problem Relation Age of Onset  . Heart attack Mother   . Depression Mother   . Heart disease Mother   . COPD Mother   . Hypertension Mother   .  Heart attack Father   . Diabetes Father   . Depression Father   . Heart disease Father   . Cirrhosis Father   . Parkinson's disease Brother     Social History Social History   Tobacco Use  . Smoking status: Former Smoker    Last attempt to quit: 04/22/2013    Years since quitting: 5.5  . Smokeless tobacco: Never Used  Substance Use Topics  . Alcohol use: No    Comment: remotely quit alcohol use. Hx of heavy alcohol use.  . Drug use: No    Review of Systems  Constitutional: No fever/chills Eyes: No visual changes. ENT: No sore throat. Cardiovascular: Positive for chest pain. Respiratory: Positive for productive cough and shortness of breath. Gastrointestinal: No abdominal pain.  No nausea, no vomiting.  No diarrhea.  No constipation. Genitourinary: Negative for dysuria. Musculoskeletal: Negative for back pain. Skin: Negative for rash. Neurological: Negative for headaches, focal weakness or numbness.   ____________________________________________   PHYSICAL EXAM:  VITAL SIGNS: ED Triage Vitals  Enc Vitals Group     BP 11/10/18 2332 (!) 169/93     Pulse Rate 11/10/18 2332 87     Resp 11/10/18 2332 20     Temp 11/10/18 2332 (!) 97.5 F (36.4 C)     Temp Source 11/10/18 2332 Oral     SpO2 11/10/18 2332 96 %     Weight 11/10/18 2337 245 lb (111.1 kg)     Height 11/10/18 2337 _0  (1.702 m)     Head Circumference --      Peak Flow --      Pain Score 11/10/18 2337 8     Pain Loc --      Pain Edu? --      Excl. in [place]? --     Constitutional: Alert and oriented. Well appearing and in no acute  distress. Eyes: Conjunctivae are normal. PERRL. EOMI. Head: Atraumatic. Nose: No congestion/rhinnorhea. Mouth/Throat: Mucous membranes are moist.  Oropharynx non-erythematous. Neck: No stridor.   Cardiovascular: Normal rate, regular rhythm. Grossly normal heart sounds.  Good peripheral circulation. Respiratory: Normal respiratory effort.  No retractions. Lungs CTAB.  No wheezing. Gastrointestinal: Soft and nontender. No distention. No abdominal bruits. No CVA tenderness. Musculoskeletal: No lower extremity tenderness nor edema.  No joint effusions. Neurologic:  Normal speech and language. No gross focal neurologic deficits are appreciated.  Skin:  Skin is warm, dry and intact. No rash noted. Psychiatric: Mood and affect are normal. Speech and behavior are normal.  ____________________________________________   LABS (all labs ordered are listed, but only abnormal results are displayed)  Labs Reviewed  BASIC METABOLIC PANEL - Abnormal; Notable for the following components:      Result Value   Glucose, Bld 183 (*)    All other components within normal limits  CULTURE, BLOOD (ROUTINE X 2)  CULTURE, BLOOD (ROUTINE X 2)  CBC  TROPONIN I  INFLUENZA PANEL BY PCR (TYPE A & B)  ETHANOL  HEPATIC FUNCTION PANEL  LIPASE, BLOOD  TROPONIN I   ____________________________________________  EKG  ED ECG REPORT I, Reizel Calzada J, the attending physician, personally viewed and interpreted this ECG.   Date: 11/11/2018  EKG Time: 2333  Rate: 89  Rhythm: normal EKG, normal sinus rhythm  Axis: Normal  Intervals:none  ST&T Change: Nonspecific  ____________________________________________  RADIOLOGY  ED MD interpretation: Probable left basilar pneumonia  Official radiology report(s): Dg Chest 2 View  Result Date: 11/11/2018 CLINICAL DATA:  Acute onset of  shortness of breath and left-sided chest pain. Productive cough. Vomiting. EXAM: CHEST - 2 VIEW COMPARISON:  Chest radiograph performed  04/30/2018 FINDINGS: The lungs are well-aerated. Mild left basilar airspace opacity may reflect pneumonia. There is no evidence of pleural effusion or pneumothorax. The heart is normal in size. The patient is status post median sternotomy. No acute osseous abnormalities are seen. IMPRESSION: Mild left basilar airspace opacity may reflect pneumonia. Electronically Signed   By: Garald Balding M.D.   On: 11/11/2018 00:17    ____________________________________________   PROCEDURES  Procedure(s) performed: None  Procedures  Critical Care performed: No  ____________________________________________   INITIAL IMPRESSION / ASSESSMENT AND PLAN / ED COURSE  As part of my medical decision making, I reviewed the following data within the electronic MEDICAL RECORD NUMBER History obtained from family, Nursing notes reviewed and incorporated, Labs reviewed, EKG interpreted, Old chart reviewed, Radiograph reviewed and Notes from prior ED visits   62 year old male with COPD on continuous oxygenation who presents with productive cough and shortness of breath. Differential includes, but is not limited to, viral syndrome, bronchitis including COPD exacerbation, pneumonia, reactive airway disease including asthma, CHF including exacerbation with or without pulmonary/interstitial edema, pneumothorax, ACS, thoracic trauma, and pulmonary embolism.  Initial lab results unremarkable.  Will add influenza, LFTs and repeat troponin.  Chest x-ray suspicious for developing left lower lobe pneumonia.  Will administer IV antibiotics and reassess.  Clinical Course as of Nov 11 524  Fri Nov 11, 2018  0403 Repeat troponin remains negative.  LFTs and lipase negative.  IV antibiotics infusing.   [JS]  0522 Pain improved.  IV antibiotics complete.  Patient voices no complaints.  Will discharge home on antibiotics, cough medicine and patient will follow-up closely with his PCP.  Strict return precautions given.  Patient and  family member verbalize understanding and agree with plan of care.   [JS]    Clinical Course User Index [JS] Paulette Blanch, MD     ____________________________________________   FINAL CLINICAL IMPRESSION(S) / ED DIAGNOSES  Final diagnoses:  Community acquired pneumonia of left lower lobe of lung ([place])  Atypical chest pain     ED Discharge Orders         Ordered    amoxicillin-clavulanate (AUGMENTIN) 875-125 MG tablet  2 times daily     11/11/18 0524    azithromycin (ZITHROMAX) 250 MG tablet  Daily     11/11/18 0524    chlorpheniramine-HYDROcodone (TUSSIONEX PENNKINETIC ER) 10-8 MG/5ML SUER  2 times daily     11/11/18 0524           Note:  This document was prepared using Dragon voice recognition software and may include unintentional dictation errors.    Paulette Blanch, MD 11/11/18 [phone]

## 2018-11-11 NOTE — ED Notes (Addendum)
Report off to [place]

## 2018-11-11 NOTE — ED Notes (Signed)
Pt reports coughing up black phlegm for 2 days.  Pt has left side chest pain.  Intermittent sob.  Pt on 2 l oxygen at home.  Hx copd.  Pt alert  Speech clear.  nsr on monitor.  Family with pt

## 2018-11-16 LAB — CULTURE, BLOOD (ROUTINE X 2)
Culture: NO GROWTH
Culture: NO GROWTH

## 2018-11-17 DIAGNOSIS — J449 Chronic obstructive pulmonary disease, unspecified: Secondary | ICD-10-CM | POA: Diagnosis not present

## 2018-11-17 DIAGNOSIS — J42 Unspecified chronic bronchitis: Secondary | ICD-10-CM | POA: Diagnosis not present

## 2018-11-18 ENCOUNTER — Other Ambulatory Visit: Payer: Self-pay | Admitting: Family Medicine

## 2018-11-18 DIAGNOSIS — M549 Dorsalgia, unspecified: Secondary | ICD-10-CM

## 2018-11-28 ENCOUNTER — Ambulatory Visit (INDEPENDENT_AMBULATORY_CARE_PROVIDER_SITE_OTHER): Payer: Medicare HMO | Admitting: Family Medicine

## 2018-11-28 ENCOUNTER — Other Ambulatory Visit: Payer: Self-pay | Admitting: Family Medicine

## 2018-11-28 ENCOUNTER — Encounter: Payer: Self-pay | Admitting: Family Medicine

## 2018-11-28 VITALS — BP 124/76 | HR 60 | Temp 97.7°F | Resp 16 | Ht 67.0 in | Wt 247.0 lb

## 2018-11-28 DIAGNOSIS — E1121 Type 2 diabetes mellitus with diabetic nephropathy: Secondary | ICD-10-CM | POA: Diagnosis not present

## 2018-11-28 DIAGNOSIS — A070 Balantidiasis: Secondary | ICD-10-CM

## 2018-11-28 DIAGNOSIS — M65311 Trigger thumb, right thumb: Secondary | ICD-10-CM | POA: Insufficient documentation

## 2018-11-28 DIAGNOSIS — J189 Pneumonia, unspecified organism: Secondary | ICD-10-CM

## 2018-11-28 MED ORDER — DAPAGLIFLOZIN PROPANEDIOL 5 MG PO TABS: 5.0000 mg | ORAL_TABLET | Freq: Every day | ORAL | 4 refills | Status: DC

## 2018-11-28 MED ORDER — KETOCONAZOLE 2 % EX CREA: 1.0000 "application " | TOPICAL_CREAM | Freq: Every day | CUTANEOUS | 0 refills | Status: DC

## 2018-11-28 MED ORDER — DAPAGLIFLOZIN PROPANEDIOL 5 MG PO TABS: 5.0000 mg | ORAL_TABLET | Freq: Every day | ORAL | 3 refills | Status: DC

## 2018-11-28 MED ORDER — NITROGLYCERIN 0.4 MG SL SUBL: 0.4000 mg | SUBLINGUAL_TABLET | SUBLINGUAL | 3 refills | Status: DC | PRN

## 2018-11-28 NOTE — Patient Instructions (Signed)
.   Please bring all of your medications to every appointment so we can make sure that our medication list is the same as yours.

## 2018-11-28 NOTE — Progress Notes (Signed)
Patient: Lawrence [name] Male    DOB: [birth year]   63 y.o.   MRN: [id number] Visit Date: 11/28/2018  Today's Provider: [name], MD   Chief Complaint  Patient presents with  . Follow-up    ER follow-up 11/11/2018  . Diabetes  . Hypertension   Subjective:     HPI    Follow up ER visit  Patient was seen in ER for Shortness of breath and Chest pain on 11/11/2018. He was treated for Community acquired pneumonia left lower lobe, Atypical chest pain. Treatment for this included Augmentin, azithromycin and Tussionex. He reports excellent compliance with treatment. He reports this condition is Improved.  ------------------------------------------------------------------------------------      Diabetes Mellitus Type II, Follow-up:   Lab Results  Component Value Date   HGBA1C 6.8 10/15/2018   HGBA1C 7.0 (A) 07/27/2018   HGBA1C 7.5 (A) 04/26/2018   Last seen for diabetes 1 months ago.  Management since then includes Stopped Jardiance due to nausea and started Farxiga 42m a day. He reports excellent compliance with treatment. He is not having side effects.   Home blood sugar records: Mid to high 100's  Episodes of hypoglycemia? no    Most Recent Eye Exam: UTD Weight trend: stable Current diet: in general, a "healthy" diet   Current exercise: some  ------------------------------------------------------------------------   Hypertension, follow-up:  BP Readings from Last 3 Encounters:  11/28/18 124/76  11/11/18 122/77  10/28/18 (!) 163/98    He was last seen for hypertension 1 months ago.  BP at that visit was 163/98. Management since that visit includes added Farxiga 568mand expect SBP to improve. He reports excellent compliance with treatment. He is not having side effects.  He is exercising. He is adherent to low salt diet.   Outside blood pressures have been elevated. He is experiencing chest pain, dyspnea, fatigue, lower extremity edema  and palpitations.  Patient denies irregular heart beat and syncope.   Cardiovascular risk factors include advanced age (older than 5535or men, 6587or women), diabetes mellitus, dyslipidemia, hypertension, male gender and obesity (BMI >= 30 kg/m2).  Use of agents associated with hypertension: none.   ------------------------------------------------------------------------    Wt Readings from Last 3 Encounters:  11/28/18 247 lb (112 kg)  11/10/18 245 lb (111.1 kg)  10/28/18 255 lb 6.4 oz (115.8 kg)    ------------------------------------------------------------------------  He also reports that has had trouble with urine getting in foreskin and some soreness of glans for the last 2- 3 months. He states it started before he was prescribed Fa[place]ast month. He is not circumcised and would like to see urologist to consider having circumcision.   He also is bothered by right thumb catching when he tried to extended it. States it is limiting his function and causing pain in thumb. He would is interested in injections to have treated.   Allergies  Allergen Reactions  . Demerol [Meperidine] Hives  . Jardiance [Empagliflozin]     Perineal pain  . Prednisone Other (See Comments) and Hypertension    Pt states that this medication puts him in A-fib   . Sulfa Antibiotics Hives  . Albuterol Sulfate [Albuterol] Palpitations and Other (See Comments)    Pt currently uses this medication.    . Morphine Sulfate Nausea And Vomiting, Rash and Other (See Comments)    Pt states that he is only allergic to the tablet form of this medication.       Current Outpatient Medications:  .  albuterol (PROVENTIL HFA;VENTOLIN HFA) 108 (90 Base) MCG/ACT inhaler, Inhale 2 puffs into the lungs every 4 (four) hours as needed for wheezing or shortness of breath. Reported on 04/08/2016, Disp: 1 Inhaler, Rfl: 5 .  albuterol (PROVENTIL) (2.5 MG/3ML) 0.083% nebulizer solution, Take 3 mLs (2.5 mg total) by nebulization  every 6 (six) hours as needed for wheezing or shortness of breath., Disp: 150 mL, Rfl: 1 .  allopurinol (ZYLOPRIM) 300 MG tablet, Take 1 tablet (300 mg total) by mouth 2 (two) times daily., Disp: 180 tablet, Rfl: 4 .  ALPRAZolam (XANAX) 1 MG tablet, TAKE ONE-HALF TO ONE TABLET BY MOUTH THREE TIMES A DAY AS NEEDED, Disp: 90 tablet, Rfl: 4 .  aspirin 81 MG chewable tablet, Chew 81 mg by mouth daily., Disp: , Rfl:  .  atorvastatin (LIPITOR) 80 MG tablet, TAKE ONE TABLET BY MOUTH AT BEDTIME., Disp: 90 tablet, Rfl: 4 .  Blood Glucose Monitoring Suppl (ACCU-CHEK AVIVA PLUS) w/Device KIT, CHECK BLOOD SUGAR EVERY DAY, Disp: 1 kit, Rfl: 0 .  cetirizine (ZYRTEC) 10 MG tablet, TAKE ONE TABLET AT BEDTIME, Disp: 30 tablet, Rfl: 11 .  cyclobenzaprine (FLEXERIL) 5 MG tablet, TAKE 1 TO 2 TABLETS BY MOUTH THREE TIMESDAILY AS NEEDED (BACK PAIN), Disp: 60 tablet, Rfl: 5 .  dapagliflozin propanediol (FARXIGA) 5 MG TABS tablet, Take 5 mg by mouth daily., Disp: 35 tablet, Rfl: 0 .  docusate sodium (COLACE) 100 MG capsule, Take 100 mg by mouth daily. , Disp: , Rfl:  .  ELIQUIS 5 MG TABS tablet, TAKE 1 TABLET BY MOUTH TWICE A DAY., Disp: 60 tablet, Rfl: 12 .  fluticasone furoate-vilanterol (BREO ELLIPTA) 100-25 MCG/INH AEPB, Inhale 1 puff into the lungs daily., Disp: 1 each, Rfl: 2 .  glucose blood (ACCU-CHEK AVIVA PLUS) test strip, Use to check blood sugar once a day, Disp: 100 each, Rfl: 4 .  isosorbide mononitrate (IMDUR) 60 MG 24 hr tablet, Take 1 tablet (60 mg total) by mouth daily. (Patient taking differently: Take 60 mg by mouth 2 (two) times daily. ), Disp: 60 tablet, Rfl: 1 .  LINZESS 72 MCG capsule, Take 72 mcg by mouth daily., Disp: , Rfl:  .  lisinopril (PRINIVIL,ZESTRIL) 2.5 MG tablet, TAKE 1 TABLET BY MOUTH DAILY, Disp: 30 tablet, Rfl: 11 .  magnesium oxide (MAG-OX) 400 (241.3 Mg) MG tablet, TAKE 1 TABLET BY MOUTH DAILY, Disp: 30 tablet, Rfl: 5 .  metFORMIN (GLUCOPHAGE) 500 MG tablet, Take 1 tablet (500 mg  total) by mouth 2 (two) times daily with a meal., Disp: 60 tablet, Rfl: 11 .  nitroGLYCERIN (NITROSTAT) 0.4 MG SL tablet, Place 1 tablet (0.4 mg total) under the tongue every 5 (five) minutes as needed for chest pain. Reported on 05/26/2016, Disp: 30 tablet, Rfl: 3 .  omega-3 acid ethyl esters (LOVAZA) 1 g capsule, TAKE FOUR CAPSULES BY MOUTH DAILY, Disp: 120 capsule, Rfl: 12 .  omeprazole (PRILOSEC) 40 MG capsule, Take 1 capsule by mouth 2 (two) times daily., Disp: , Rfl:  .  oxyCODONE-acetaminophen (PERCOCET) 10-325 MG tablet, Take 1 tablet by mouth every 4 (four) hours as needed., Disp: 150 tablet, Rfl: 0 .  OXYGEN, Inhale 2 L into the lungs. Wears ATC and at night, Disp: , Rfl:  .  sotalol (BETAPACE) 80 MG tablet, , Disp: , Rfl:  .  SPIRIVA HANDIHALER 18 MCG inhalation capsule, INHALE ONE CAPSULE AS DIRECTED ONCE A DAY, Disp: 30 capsule, Rfl: 12 .  tamsulosin (FLOMAX) 0.4 MG CAPS capsule, Take 0.4 mg  by mouth daily. , Disp: , Rfl:  .  torsemide (DEMADEX) 100 MG tablet, Take 0.5 tablets by mouth daily., Disp: , Rfl:  .  amoxicillin-clavulanate (AUGMENTIN) 875-125 MG tablet, Take 1 tablet by mouth 2 (two) times daily. (Patient not taking: Reported on 11/28/2018), Disp: 14 tablet, Rfl: 0 .  azithromycin (ZITHROMAX) 250 MG tablet, Take 1 tablet (250 mg total) by mouth daily. (Patient not taking: Reported on 11/28/2018), Disp: 4 each, Rfl: 0 .  chlorpheniramine-HYDROcodone (TUSSIONEX PENNKINETIC ER) 10-8 MG/5ML SUER, Take 5 mLs by mouth 2 (two) times daily. (Patient not taking: Reported on 11/28/2018), Disp: 70 mL, Rfl: 0 .  levofloxacin (LEVAQUIN) 500 MG tablet, Take 1 tablet (500 mg total) by mouth daily. (Patient not taking: Reported on 11/28/2018), Disp: 5 tablet, Rfl: 0 .  ondansetron (ZOFRAN ODT) 4 MG disintegrating tablet, Take 1 tablet (4 mg total) by mouth every 8 (eight) hours as needed for nausea or vomiting., Disp: 20 tablet, Rfl: 1 .  ondansetron (ZOFRAN) 4 MG tablet, Take 1 tablet (4 mg total)  by mouth every 8 (eight) hours as needed for nausea or vomiting. (Patient not taking: Reported on 11/28/2018), Disp: 20 tablet, Rfl: 0 .  potassium chloride (K-DUR,KLOR-CON) 10 MEQ tablet, Take by mouth., Disp: , Rfl:  .  RANEXA 500 MG 12 hr tablet, Take 1,000 mg by mouth 2 (two) times daily., Disp: , Rfl:   Review of Systems  Constitutional: Positive for diaphoresis, fatigue and fever. Negative for activity change, appetite change, chills and unexpected weight change.  HENT: Positive for congestion and sneezing. Negative for ear discharge, ear pain, postnasal drip, rhinorrhea, sinus pressure, sinus pain, sore throat, tinnitus, trouble swallowing and voice change.   Eyes: Negative.   Respiratory: Positive for cough, chest tightness, shortness of breath and wheezing. Negative for apnea, choking and stridor.        Pt reports his symptoms are back to baseline.   Gastrointestinal: Negative.   Skin: Negative for wound.  Neurological: Positive for numbness (legs and feet) and headaches. Negative for dizziness and light-headedness.    Social History   Tobacco Use  . Smoking status: Former Smoker    Last attempt to quit: 04/22/2013    Years since quitting: 5.6  . Smokeless tobacco: Never Used  Substance Use Topics  . Alcohol use: No    Comment: remotely quit alcohol use. Hx of heavy alcohol use.      Objective:   BP 124/76 (BP Location: Left Arm, Patient Position: Sitting, Cuff Size: Large)   Pulse 60   Temp 97.7 F (36.5 C) (Oral)   Resp 16   Ht _0  (1.702 m)   Wt 247 lb (112 kg)   BMI 38.69 kg/m  Vitals:   11/28/18 1358  BP: 124/76  Pulse: 60  Resp: 16  Temp: 97.7 F (36.5 C)  TempSrc: Oral  Weight: 247 lb (112 kg)  Height: _1  (1.702 m)     Physical Exam  General Appearance:    Alert, cooperative, no distress, obese  Eyes:    PERRL, conjunctiva/corneas clear, EOM's intact       Lungs:     Clear to auscultation bilaterally, respirations unlabored  Heart:     Regular rate and rhythm  MS:   Slightly swollen right trigger thumb.   Neurologic:   Awake, alert, oriented x 3. No apparent focal neurological           defect.  Assessment & Plan    1. Type 2 diabetes mellitus with diabetic nephropathy, without long-term current use of insulin (HCC) Doing well on current regiment. With a1c at goal. Continue current medications.  Is likely having some minor GU side effects as below.   Follow up in 3 months.   2. Pneumonia of left lung due to infectious organism, unspecified part of lung Has completed azithromycin and symptoms now resolved.   3. Trigger thumb of right hand  - Ambulatory referral to Orthopedic Surgery  4. Balantidiasis He states he started having sx before starting Farxiga, but advised that medication may be exacerbating sx. His is interested in circumcision. Considering significant cardiac benefits of SGLT-2 inhibitors will continue [place] for now. Treat for suspected candidiasis and - Ambulatory referral to Urology to consider circumcision.   - ketoconazole (NIZORAL) 2 % cream; Apply 1 application topically daily.  Dispense: 15 g; Refill: 0       [name], MD  [facility]

## 2018-11-28 NOTE — Telephone Encounter (Signed)
°  Pt's Farxiga 42m was not called in from today's visit.   [place] at[place]  [place] [phone](Phone) [phone](Fax)    Thanks, [facility]

## 2018-11-29 ENCOUNTER — Other Ambulatory Visit: Payer: Self-pay

## 2018-11-29 MED ORDER — DAPAGLIFLOZIN PROPANEDIOL 5 MG PO TABS: 5.0000 mg | ORAL_TABLET | Freq: Every day | ORAL | 4 refills | Status: DC

## 2018-11-29 NOTE — Telephone Encounter (Signed)
Pt called stating the pharmacy did not get the refill for Farxiga.  Please send to [facility].

## 2018-12-01 ENCOUNTER — Other Ambulatory Visit: Payer: Self-pay | Admitting: Family Medicine

## 2018-12-01 DIAGNOSIS — M65311 Trigger thumb, right thumb: Secondary | ICD-10-CM | POA: Diagnosis not present

## 2018-12-01 DIAGNOSIS — M5410 Radiculopathy, site unspecified: Secondary | ICD-10-CM

## 2018-12-01 MED ORDER — OXYCODONE-ACETAMINOPHEN 10-325 MG PO TABS: 1.0000 | ORAL_TABLET | ORAL | 0 refills | Status: DC | PRN

## 2018-12-01 NOTE — Telephone Encounter (Signed)
Pt requesting refill of oxyCODONE-acetaminophen 10-325 MG tablet sent to [facility]

## 2018-12-11 ENCOUNTER — Emergency Department: Payer: Medicare HMO

## 2018-12-11 ENCOUNTER — Encounter: Payer: Self-pay | Admitting: Emergency Medicine

## 2018-12-11 ENCOUNTER — Other Ambulatory Visit: Payer: Self-pay

## 2018-12-11 ENCOUNTER — Emergency Department
Admission: EM | Admit: 2018-12-11 | Discharge: 2018-12-11 | Disposition: A | Discharge status: Home / Self Care | Payer: Medicare HMO | Attending: Emergency Medicine | Admitting: Emergency Medicine

## 2018-12-11 DIAGNOSIS — J441 Chronic obstructive pulmonary disease with (acute) exacerbation: Secondary | ICD-10-CM | POA: Diagnosis not present

## 2018-12-11 DIAGNOSIS — R05 Cough: Secondary | ICD-10-CM | POA: Diagnosis not present

## 2018-12-11 DIAGNOSIS — I509 Heart failure, unspecified: Secondary | ICD-10-CM | POA: Diagnosis not present

## 2018-12-11 DIAGNOSIS — Z7984 Long term (current) use of oral hypoglycemic drugs: Secondary | ICD-10-CM | POA: Diagnosis not present

## 2018-12-11 DIAGNOSIS — R079 Chest pain, unspecified: Secondary | ICD-10-CM | POA: Diagnosis not present

## 2018-12-11 DIAGNOSIS — J209 Acute bronchitis, unspecified: Secondary | ICD-10-CM | POA: Diagnosis not present

## 2018-12-11 DIAGNOSIS — Z79899 Other long term (current) drug therapy: Secondary | ICD-10-CM | POA: Insufficient documentation

## 2018-12-11 DIAGNOSIS — I2510 Atherosclerotic heart disease of native coronary artery without angina pectoris: Secondary | ICD-10-CM | POA: Diagnosis not present

## 2018-12-11 DIAGNOSIS — Z87891 Personal history of nicotine dependence: Secondary | ICD-10-CM | POA: Insufficient documentation

## 2018-12-11 DIAGNOSIS — J449 Chronic obstructive pulmonary disease, unspecified: Secondary | ICD-10-CM | POA: Insufficient documentation

## 2018-12-11 DIAGNOSIS — I130 Hypertensive heart and chronic kidney disease with heart failure and stage 1 through stage 4 chronic kidney disease, or unspecified chronic kidney disease: Secondary | ICD-10-CM | POA: Diagnosis not present

## 2018-12-11 DIAGNOSIS — R0602 Shortness of breath: Secondary | ICD-10-CM | POA: Diagnosis not present

## 2018-12-11 DIAGNOSIS — Z7982 Long term (current) use of aspirin: Secondary | ICD-10-CM | POA: Diagnosis not present

## 2018-12-11 DIAGNOSIS — E1122 Type 2 diabetes mellitus with diabetic chronic kidney disease: Secondary | ICD-10-CM | POA: Insufficient documentation

## 2018-12-11 DIAGNOSIS — J40 Bronchitis, not specified as acute or chronic: Secondary | ICD-10-CM | POA: Diagnosis not present

## 2018-12-11 DIAGNOSIS — N182 Chronic kidney disease, stage 2 (mild): Secondary | ICD-10-CM | POA: Diagnosis not present

## 2018-12-11 LAB — BASIC METABOLIC PANEL
Anion gap: 6 (ref 5–15)
BUN: 13 mg/dL (ref 8–23)
CO2: 28 mmol/L (ref 22–32)
Calcium: 9 mg/dL (ref 8.9–10.3)
Chloride: 107 mmol/L (ref 98–111)
Creatinine, Ser: 0.95 mg/dL (ref 0.61–1.24)
GFR calc Af Amer: >60 mL/min (ref >60)
GFR calc non Af Amer: >60 mL/min (ref >60)
Glucose, Bld: 200 mg/dL — ABNORMAL HIGH (ref 70–99)
Potassium: 4.3 mmol/L (ref 3.5–5.1)
Sodium: 141 mmol/L (ref 135–145)

## 2018-12-11 LAB — CBC
HCT: 50 % (ref 39.0–52.0)
Hemoglobin: 15.8 g/dL (ref 13.0–17.0)
MCH: 29.3 pg (ref 26.0–34.0)
MCHC: 31.6 g/dL (ref 30.0–36.0)
MCV: 92.6 fL (ref 80.0–100.0)
Platelets: 165 10*3/uL (ref 150–400)
RBC: 5.4 MIL/uL (ref 4.22–5.81)
RDW: 13.7 % (ref 11.5–15.5)
WBC: 6 10*3/uL (ref 4.0–10.5)
nRBC: 0 % (ref 0.0–0.2)

## 2018-12-11 LAB — PROTIME-INR
INR: 0.89
Prothrombin Time: 12 seconds (ref 11.4–15.2)

## 2018-12-11 LAB — TROPONIN I: Troponin I: <0.03 ng/mL (ref <0.03)

## 2018-12-11 MED ORDER — IPRATROPIUM-ALBUTEROL 0.5-2.5 (3) MG/3ML IN SOLN
3.0000 mL | Freq: Once | RESPIRATORY_TRACT | Status: AC
  Administered 2018-12-11: 3 mL via RESPIRATORY_TRACT
  Filled 2018-12-11: qty 3

## 2018-12-11 MED ORDER — AZITHROMYCIN 500 MG PO TABS
500.0000 mg | ORAL_TABLET | Freq: Once | ORAL | Status: AC
  Administered 2018-12-11: 500 mg via ORAL
  Filled 2018-12-11: qty 1

## 2018-12-11 MED ORDER — FENTANYL CITRATE (PF) 100 MCG/2ML IJ SOLN
50.0000 ug | Freq: Once | INTRAMUSCULAR | Status: AC
  Administered 2018-12-11: 50 ug via INTRAVENOUS
  Filled 2018-12-11: qty 2

## 2018-12-11 MED ORDER — OXYCODONE-ACETAMINOPHEN 5-325 MG PO TABS
2.0000 | ORAL_TABLET | Freq: Once | ORAL | Status: AC
  Administered 2018-12-11: 2 via ORAL
  Filled 2018-12-11: qty 2

## 2018-12-11 MED ORDER — METHYLPREDNISOLONE SODIUM SUCC 125 MG IJ SOLR
125.0000 mg | Freq: Once | INTRAMUSCULAR | Status: AC
  Administered 2018-12-11: 125 mg via INTRAVENOUS
  Filled 2018-12-11: qty 2

## 2018-12-11 MED ORDER — AZITHROMYCIN 250 MG PO TABS: 250.0000 mg | ORAL_TABLET | Freq: Every day | ORAL | 0 refills | Status: DC

## 2018-12-11 MED ORDER — PREDNISONE 10 MG PO TABS: 10.0000 mg | ORAL_TABLET | Freq: Every day | ORAL | 0 refills | Status: DC

## 2018-12-11 NOTE — ED Triage Notes (Signed)
PT arrived with complaints of mid sternum chest tightness and shortness of breath. Pt states the tightness is intensified with coughing. HX of COPD wears 2L chronically but arrives on room air with a saturation of 94-95% on room air.

## 2018-12-11 NOTE — ED Notes (Signed)
Patient transported to X-ray 

## 2018-12-11 NOTE — ED Provider Notes (Signed)
[facility] Emergency Department Provider Note  Time seen: 5:37 PM  I have reviewed the triage vital signs and the nursing notes.   HISTORY  Chief Complaint Chest Pain and Shortness of Breath    HPI Lawrence [name] is a 63 y.o. male with a past medical history of anemia, anxiety, CAD, gastric reflux, diabetes, COPD on 3 L of oxygen 24/7, presents to the emergency department for shortness of breath.  According to the patient approximately 3 weeks ago he was diagnosed with pneumonia.  Thought he was feeling better however over the past 1 week has had worsening cough along with intermittent chest pain.  Patient was concerned that he could have developed pneumonia.  Patient denies any known fever.  States he is coughing more than normal but no sputum production.  Intermittent mild chest discomfort described more as a tightness sensation.   Past Medical History:  Diagnosis Date  . A-fib ([place])   . Anemia   . Anginal pain ([place])   . Anxiety   . Arthritis   . Asthma   . CAD (coronary artery disease)    a. 2002 CABGx2 (LIMA->LAD, VG->VG->OM1);  b. 09/2012 DES->OM;  c. 03/2015 PTCA of LAD [facility]) in setting of atretic LIMA; d. 05/2015 Cath [facility]): nonobs dzs; e. 06/2015 Cath ([facility]): LM nl, LAD 45p/d ISR, 50d, D1/2 small, LCX 50p/d ISR, OM1 70ost, 30 ISR, VG->OM1 50ost, 7m LIMA->LAD 99p/d - atretic, RCA dom, nl; f.cath 10/16: 40-50%(FFR 0.90) pLAD, 75% (FFR 0.77) mLAD s/p PCI/DES, oRCA 40% (FFR0.95)  . Celiac disease   . Chronic diastolic CHF (congestive heart failure) ([place]    a. 06/2009 Echo: EF 60-65%, Gr 1 DD, triv AI, mildly dil LA, nl RV.  .Marland KitchenCOPD (chronic obstructive pulmonary disease) ([place]    a. Chronic bronchitis and emphysema.  . Diverticulosis   . Dysrhythmia   . Essential hypertension   . GERD (gastroesophageal reflux disease)   . History of tobacco abuse    a. Quit 2014.  .Marland KitchenPancreatitis   . PSVT (paroxysmal supraventricular tachycardia) ([place]    a. 10/2012  Noted on Zio Patch.  . Tubular adenoma of colon   . Type II diabetes mellitus ([facility]     Patient Active Problem List   Diagnosis Date Noted  . Trigger thumb of right hand 11/28/2018  . Eye pain, left 08/18/2018  . Acute on chronic heart failure ([place] 04/18/2018  . History of adenomatous polyp of colon 04/05/2018  . Abdominal pain, chronic, epigastric 11/06/2017  . Bilateral flank pain 03/24/2017  . Dyspnea 04/03/2016  . Hypotension 04/03/2016  . CKD (chronic kidney disease) stage 2, GFR 60-89 ml/min 04/03/2016  . Anemia 04/03/2016  . Paroxysmal atrial fibrillation ([place] 12/23/2015  . OSA (obstructive sleep apnea) 12/10/2015  . Left inguinal hernia 11/07/2015  . Anxiety 11/07/2015  . Unstable angina ([place] 10/05/2015  . Back pain with left-sided radiculopathy 09/30/2015  . Nocturnal hypoxia 09/06/2015  . BPH (benign prostatic hyperplasia) 08/01/2015  . Chronic diastolic CHF (congestive heart failure) ([place]   . Angina pectoris ([place]   . Chest pain 07/11/2015  . COPD (chronic obstructive pulmonary disease) ([place] 07/03/2015  . CAD (coronary artery disease) 06/26/2015  . HTN (hypertension) 06/26/2015  . 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[place] 06/26/2015  . Achalasia 07/24/2014  . GERD (gastroesophageal reflux disease) 06/07/2014  . Former tobacco use 04/11/2013  . HLD (hyperlipidemia) 04/09/2013    Past Surgical History:  Procedure Laterality Date  . BYPASS GRAFT    .  CARDIAC CATHETERIZATION N/A 07/12/2015   rocedure: Left Heart Cath and Cors/Grafts Angiography;  Surgeon: Belva Crome, MD;  Location: [place] CV LAB;  Service: Cardiovascular;  Laterality: N/A;  . CARDIAC CATHETERIZATION Right 10/07/2015   Procedure: Left Heart Cath and Cors/Grafts Angiography;  Surgeon: Dionisio David, MD;  Location: [place] CV LAB;  Service: Cardiovascular;  Laterality: Right;  . CARDIAC CATHETERIZATION N/A 04/06/2016   Procedure: Left Heart Cath and Coronary Angiography;   Surgeon: Yolonda Kida, MD;  Location: [place] CV LAB;  Service: Cardiovascular;  Laterality: N/A;  . CARDIAC CATHETERIZATION  04/06/2016   Procedure: Bypass Graft Angiography;  Surgeon: Yolonda Kida, MD;  Location: [place] CV LAB;  Service: Cardiovascular;;  . CARDIAC CATHETERIZATION N/A 11/02/2016   Procedure: Left Heart Cath and Cors/Grafts Angiography and possible PCI;  Surgeon: Yolonda Kida, MD;  Location: [place] CV LAB;  Service: Cardiovascular;  Laterality: N/A;  . CARDIAC CATHETERIZATION N/A 11/02/2016   Procedure: Coronary Stent Intervention;  Surgeon: Yolonda Kida, MD;  Location: [place] CV LAB;  Service: Cardiovascular;  Laterality: N/A;  . CHOLECYSTECTOMY    . COLONOSCOPY WITH PROPOFOL N/A 04/01/2018   Procedure: COLONOSCOPY WITH PROPOFOL;  Surgeon: Manya Silvas, MD;  [facility] ENDOSCOPY;  Service: Endoscopy;  Laterality: N/A;  . ESOPHAGEAL DILATION    . ESOPHAGOGASTRODUODENOSCOPY (EGD) WITH PROPOFOL N/A 04/01/2018   Procedure: ESOPHAGOGASTRODUODENOSCOPY (EGD) WITH PROPOFOL;  Surgeon: Manya Silvas, MD;  Location: [facility] ENDOSCOPY;  Service: Endoscopy;  Laterality: N/A;  . TONSILLECTOMY    . VASCULAR SURGERY      Prior to Admission medications   Medication Sig Start Date End Date Taking? Authorizing Provider  albuterol (PROVENTIL HFA;VENTOLIN HFA) 108 (90 Base) MCG/ACT inhaler Inhale 2 puffs into the lungs every 4 (four) hours as needed for wheezing or shortness of breath. Reported on 04/08/2016 10/21/17   Birdie Sons, MD  albuterol (PROVENTIL) (2.5 MG/3ML) 0.083% nebulizer solution Take 3 mLs (2.5 mg total) by nebulization every 6 (six) hours as needed for wheezing or shortness of breath. 01/04/18   Birdie Sons, MD  allopurinol (ZYLOPRIM) 300 MG tablet Take 1 tablet (300 mg total) by mouth 2 (two) times daily. 10/05/18   Birdie Sons, MD  ALPRAZolam Duanne Moron) 1 MG tablet TAKE ONE-HALF TO ONE TABLET BY MOUTH THREE TIMES A  DAY AS NEEDED 10/06/18   Birdie Sons, MD  aspirin 81 MG chewable tablet Chew 81 mg by mouth daily.    [provider]  atorvastatin (LIPITOR) 80 MG tablet TAKE ONE TABLET BY MOUTH AT BEDTIME. 09/19/18   Birdie Sons, MD  Blood Glucose Monitoring Suppl (ACCU-CHEK AVIVA PLUS) w/Device KIT CHECK BLOOD SUGAR EVERY DAY 12/08/17   Birdie Sons, MD  cetirizine (ZYRTEC) 10 MG tablet TAKE ONE TABLET AT BEDTIME 07/11/18   Birdie Sons, MD  cyclobenzaprine (FLEXERIL) 5 MG tablet TAKE 1 TO 2 TABLETS BY MOUTH THREE TIMESDAILY AS NEEDED (BACK PAIN) 11/18/18   Birdie Sons, MD  dapagliflozin propanediol (FARXIGA) 5 MG TABS tablet Take 5 mg by mouth daily. 11/29/18   Birdie Sons, MD  docusate sodium (COLACE) 100 MG capsule Take 100 mg by mouth daily.     [provider]  ELIQUIS 5 MG TABS tablet TAKE 1 TABLET BY MOUTH TWICE A DAY. 08/06/18   Birdie Sons, MD  fluticasone furoate-vilanterol (BREO ELLIPTA) 100-25 MCG/INH AEPB Inhale 1 puff into the lungs daily. 08/19/18  Devona Konig A, MD  glucose blood (ACCU-CHEK AVIVA PLUS) test strip Use to check blood sugar once a day 02/14/18   Birdie Sons, MD  isosorbide mononitrate (IMDUR) 60 MG 24 hr tablet Take 1 tablet (60 mg total) by mouth daily. Patient taking differently: Take 60 mg by mouth 2 (two) times daily.  11/04/16   Henreitta Leber, MD  ketoconazole (NIZORAL) 2 % cream Apply 1 application topically daily. 11/28/18   Birdie Sons, MD  LINZESS 72 MCG capsule Take 72 mcg by mouth daily.    [provider]  lisinopril (PRINIVIL,ZESTRIL) 2.5 MG tablet TAKE 1 TABLET BY MOUTH DAILY 10/20/18   Birdie Sons, MD  magnesium oxide (MAG-OX) 400 (241.3 Mg) MG tablet TAKE 1 TABLET BY MOUTH DAILY 10/28/18   Alisa Graff, FNP  metFORMIN (GLUCOPHAGE) 500 MG tablet Take 1 tablet (500 mg total) by mouth 2 (two) times daily with a meal. 06/15/18   Birdie Sons, MD  nitroGLYCERIN (NITROSTAT) 0.4 MG SL tablet Place 1  tablet (0.4 mg total) under the tongue every 5 (five) minutes as needed for chest pain. Reported on 05/26/2016 11/28/18   Birdie Sons, MD  omega-3 acid ethyl esters (LOVAZA) 1 g capsule TAKE FOUR CAPSULES BY MOUTH DAILY 06/24/18   Birdie Sons, MD  omeprazole (PRILOSEC) 40 MG capsule Take 1 capsule by mouth 2 (two) times daily. 04/22/18 04/22/19  [provider]  oxyCODONE-acetaminophen (PERCOCET) 10-325 MG tablet Take 1 tablet by mouth every 4 (four) hours as needed. 12/01/18   Birdie Sons, MD  OXYGEN Inhale 2 L into the lungs. Wears ATC and at night    [provider]  sotalol (BETAPACE) 80 MG tablet  10/05/18   [provider]  SPIRIVA HANDIHALER 18 MCG inhalation capsule INHALE ONE CAPSULE AS DIRECTED ONCE A DAY 02/03/18   Birdie Sons, MD  tamsulosin (FLOMAX) 0.4 MG CAPS capsule Take 0.4 mg by mouth daily.     [provider]  torsemide (DEMADEX) 100 MG tablet Take 0.5 tablets by mouth daily. 10/11/17   [provider]    Allergies  Allergen Reactions  . Demerol [Meperidine] Hives  . Jardiance [Empagliflozin]     Perineal pain  . Prednisone Other (See Comments) and Hypertension    Pt states that this medication puts him in A-fib   . Sulfa Antibiotics Hives  . Albuterol Sulfate [Albuterol] Palpitations and Other (See Comments)    Pt currently uses this medication.    . Morphine Sulfate Nausea And Vomiting, Rash and Other (See Comments)    Pt states that he is only allergic to the tablet form of this medication.      Family History  Problem Relation Age of Onset  . Heart attack Mother   . Depression Mother   . Heart disease Mother   . COPD Mother   . Hypertension Mother   . Heart attack Father   . Diabetes Father   . Depression Father   . Heart disease Father   . Cirrhosis Father   . Parkinson's disease Brother     Social History Social History   Tobacco Use  . Smoking status: Former Smoker    Last attempt to quit:  04/22/2013    Years since quitting: 5.6  . Smokeless tobacco: Never Used  Substance Use Topics  . Alcohol use: No    Comment: remotely quit alcohol use. Hx of heavy alcohol use.  . Drug use: No  Review of Systems Constitutional: Negative for fever. Cardiovascular: Mild chest discomfort/tightness Respiratory: Positive for shortness of breath and cough Gastrointestinal: Negative for abdominal pain, vomiting Genitourinary: Negative for urinary compaints Musculoskeletal: Negative for musculoskeletal complaints Skin: Negative for skin complaints  Neurological: Negative for headache All other ROS negative  ____________________________________________   PHYSICAL EXAM:  VITAL SIGNS: ED Triage Vitals  Enc Vitals Group     BP 12/11/18 1708 (!) 165/82     Pulse Rate 12/11/18 1708 70     Resp 12/11/18 1708 20     Temp 12/11/18 1708 97.6 F (36.4 C)     Temp src --      SpO2 12/11/18 1708 94 %     Weight 12/11/18 1710 248 lb (112.5 kg)     Height 12/11/18 1710 _0  (1.702 m)     Head Circumference --      Peak Flow --      Pain Score 12/11/18 1710 8     Pain Loc --      Pain Edu? --      Excl. in [place]? --    Constitutional: Alert and oriented. Well appearing and in no distress. Eyes: Normal exam ENT   Head: Normocephalic and atraumatic.   Mouth/Throat: Mucous membranes are moist. Cardiovascular: Normal rate, regular rhythm.  Respiratory: Normal respiratory effort without tachypnea nor retractions.  There is slight expiratory wheeze bilaterally.  No rales or rhonchi. Gastrointestinal: Soft and nontender. No distention. Musculoskeletal: Nontender with normal range of motion in all extremities. Neurologic:  Normal speech and language. No gross focal neurologic deficits Skin:  Skin is warm, dry and intact.  Psychiatric: Mood and affect are normal.   ____________________________________________    EKG  EKG viewed and interpreted by myself shows a normal sinus rhythm  at 73 bpm with a narrow QRS, normal axis, normal intervals, no concerning ST changes.  ____________________________________________    RADIOLOGY  X-ray consistent with bronchitis or reactive airway disease.  ____________________________________________   INITIAL IMPRESSION / ASSESSMENT AND PLAN / ED COURSE  Pertinent labs & imaging results that were available during my care of the patient were reviewed by me and considered in my medical decision making (see chart for details).  Patient presents to the emergency department for shortness of breath worsening over the past 1 week with intermittent chest discomfort.  Patient states he has been coughing more than normal.  Patient wears 3 L of oxygen 24/7 for COPD however did not bring it with him to the emergency department.  Currently on room air satting 98% with a good waveform.  Patient has very minimal expiratory wheeze on exam.  We will treat with duo nebs and Solu-Medrol.  We will check labs including cardiac enzymes.  We will obtain a chest x-ray and continue to closely monitor.  Differential at this time would include COPD exacerbation, pneumonia, URI, bronchitis, ACS.  X-ray most consistent bronchitis/reactive airway disease.  No pneumonia.  Lab work largely reassuring.  We will discharge on prednisone taper, Zithromax.  Patient has breathing treatments at home.  Patient has chronic pain medication at home.  Patient has not taken his chronic pain medication since lunchtime we will dose to Percocet prior to discharge.  Patient agreeable to plan of care.   ____________________________________________   FINAL CLINICAL IMPRESSION(S) / ED DIAGNOSES  COPD exacerbation Acute bronchitis   Harvest Dark, MD 12/11/18 1859

## 2018-12-12 DIAGNOSIS — G4710 Hypersomnia, unspecified: Secondary | ICD-10-CM | POA: Diagnosis not present

## 2018-12-12 DIAGNOSIS — J439 Emphysema, unspecified: Secondary | ICD-10-CM | POA: Diagnosis not present

## 2018-12-12 DIAGNOSIS — J449 Chronic obstructive pulmonary disease, unspecified: Secondary | ICD-10-CM | POA: Diagnosis not present

## 2018-12-12 DIAGNOSIS — I10 Essential (primary) hypertension: Secondary | ICD-10-CM | POA: Diagnosis not present

## 2018-12-14 ENCOUNTER — Observation Stay
Admission: EM | Admit: 2018-12-14 | Discharge: 2018-12-16 | Disposition: A | Discharge status: Home / Self Care | Payer: Medicare HMO | Attending: Internal Medicine | Admitting: Internal Medicine

## 2018-12-14 ENCOUNTER — Encounter: Payer: Self-pay | Admitting: Emergency Medicine

## 2018-12-14 ENCOUNTER — Other Ambulatory Visit: Payer: Self-pay

## 2018-12-14 ENCOUNTER — Emergency Department: Payer: Medicare HMO

## 2018-12-14 DIAGNOSIS — G4733 Obstructive sleep apnea (adult) (pediatric): Secondary | ICD-10-CM | POA: Insufficient documentation

## 2018-12-14 DIAGNOSIS — Z9981 Dependence on supplemental oxygen: Secondary | ICD-10-CM | POA: Insufficient documentation

## 2018-12-14 DIAGNOSIS — N182 Chronic kidney disease, stage 2 (mild): Secondary | ICD-10-CM | POA: Insufficient documentation

## 2018-12-14 DIAGNOSIS — R079 Chest pain, unspecified: Secondary | ICD-10-CM | POA: Diagnosis not present

## 2018-12-14 DIAGNOSIS — K219 Gastro-esophageal reflux disease without esophagitis: Secondary | ICD-10-CM | POA: Diagnosis not present

## 2018-12-14 DIAGNOSIS — I482 Chronic atrial fibrillation, unspecified: Secondary | ICD-10-CM | POA: Insufficient documentation

## 2018-12-14 DIAGNOSIS — R0789 Other chest pain: Principal | ICD-10-CM | POA: Insufficient documentation

## 2018-12-14 DIAGNOSIS — E1122 Type 2 diabetes mellitus with diabetic chronic kidney disease: Secondary | ICD-10-CM | POA: Insufficient documentation

## 2018-12-14 DIAGNOSIS — Z87891 Personal history of nicotine dependence: Secondary | ICD-10-CM | POA: Diagnosis not present

## 2018-12-14 DIAGNOSIS — I5032 Chronic diastolic (congestive) heart failure: Secondary | ICD-10-CM | POA: Diagnosis not present

## 2018-12-14 DIAGNOSIS — I25110 Atherosclerotic heart disease of native coronary artery with unstable angina pectoris: Secondary | ICD-10-CM | POA: Diagnosis not present

## 2018-12-14 DIAGNOSIS — I130 Hypertensive heart and chronic kidney disease with heart failure and stage 1 through stage 4 chronic kidney disease, or unspecified chronic kidney disease: Secondary | ICD-10-CM | POA: Insufficient documentation

## 2018-12-14 DIAGNOSIS — Z951 Presence of aortocoronary bypass graft: Secondary | ICD-10-CM | POA: Insufficient documentation

## 2018-12-14 DIAGNOSIS — J449 Chronic obstructive pulmonary disease, unspecified: Secondary | ICD-10-CM | POA: Diagnosis not present

## 2018-12-14 DIAGNOSIS — E785 Hyperlipidemia, unspecified: Secondary | ICD-10-CM | POA: Diagnosis not present

## 2018-12-14 DIAGNOSIS — E119 Type 2 diabetes mellitus without complications: Secondary | ICD-10-CM | POA: Diagnosis not present

## 2018-12-14 DIAGNOSIS — I10 Essential (primary) hypertension: Secondary | ICD-10-CM | POA: Diagnosis not present

## 2018-12-14 DIAGNOSIS — Z7982 Long term (current) use of aspirin: Secondary | ICD-10-CM | POA: Insufficient documentation

## 2018-12-14 DIAGNOSIS — Z79899 Other long term (current) drug therapy: Secondary | ICD-10-CM | POA: Diagnosis not present

## 2018-12-14 DIAGNOSIS — I2510 Atherosclerotic heart disease of native coronary artery without angina pectoris: Secondary | ICD-10-CM | POA: Diagnosis not present

## 2018-12-14 DIAGNOSIS — Z7901 Long term (current) use of anticoagulants: Secondary | ICD-10-CM | POA: Insufficient documentation

## 2018-12-14 LAB — BASIC METABOLIC PANEL
Anion gap: 6 (ref 5–15)
BUN: 25 mg/dL — ABNORMAL HIGH (ref 8–23)
CO2: 31 mmol/L (ref 22–32)
Calcium: 9.4 mg/dL (ref 8.9–10.3)
Chloride: 99 mmol/L (ref 98–111)
Creatinine, Ser: 1.03 mg/dL (ref 0.61–1.24)
GFR calc Af Amer: >60 mL/min (ref >60)
GFR calc non Af Amer: >60 mL/min (ref >60)
Glucose, Bld: 358 mg/dL — ABNORMAL HIGH (ref 70–99)
Potassium: 4.5 mmol/L (ref 3.5–5.1)
Sodium: 136 mmol/L (ref 135–145)

## 2018-12-14 LAB — CBC
HCT: 50.6 % (ref 39.0–52.0)
Hemoglobin: 16.3 g/dL (ref 13.0–17.0)
MCH: 29.5 pg (ref 26.0–34.0)
MCHC: 32.2 g/dL (ref 30.0–36.0)
MCV: 91.5 fL (ref 80.0–100.0)
Platelets: 188 10*3/uL (ref 150–400)
RBC: 5.53 MIL/uL (ref 4.22–5.81)
RDW: 13.6 % (ref 11.5–15.5)
WBC: 10.8 10*3/uL — ABNORMAL HIGH (ref 4.0–10.5)
nRBC: 0 % (ref 0.0–0.2)

## 2018-12-14 LAB — TROPONIN I
Troponin I: <0.03 ng/mL (ref <0.03)
Troponin I: <0.03 ng/mL (ref <0.03)

## 2018-12-14 MED ORDER — TAMSULOSIN HCL 0.4 MG PO CAPS
0.4000 mg | ORAL_CAPSULE | Freq: Every day | ORAL | Status: DC
  Administered 2018-12-15 – 2018-12-16 (×2): 0.4 mg via ORAL
  Filled 2018-12-14 (×2): qty 1

## 2018-12-14 MED ORDER — MAGNESIUM OXIDE 400 (241.3 MG) MG PO TABS
400.0000 mg | ORAL_TABLET | Freq: Every day | ORAL | Status: DC
  Administered 2018-12-15 – 2018-12-16 (×2): 400 mg via ORAL
  Filled 2018-12-14 (×2): qty 1

## 2018-12-14 MED ORDER — ALLOPURINOL 300 MG PO TABS
300.0000 mg | ORAL_TABLET | Freq: Two times a day (BID) | ORAL | Status: DC
  Administered 2018-12-15 – 2018-12-16 (×4): 300 mg via ORAL
  Filled 2018-12-14 (×5): qty 1

## 2018-12-14 MED ORDER — OXYCODONE-ACETAMINOPHEN 5-325 MG PO TABS
1.0000 | ORAL_TABLET | ORAL | Status: DC | PRN
  Administered 2018-12-14 – 2018-12-15 (×2): 1 via ORAL
  Filled 2018-12-14 (×2): qty 1

## 2018-12-14 MED ORDER — ONDANSETRON HCL 4 MG/2ML IJ SOLN
4.0000 mg | Freq: Four times a day (QID) | INTRAMUSCULAR | Status: DC | PRN
  Administered 2018-12-15: 4 mg via INTRAVENOUS
  Filled 2018-12-14: qty 2

## 2018-12-14 MED ORDER — ONDANSETRON HCL 4 MG/2ML IJ SOLN: 4.0000 mg | Freq: Once | INTRAMUSCULAR | Status: DC

## 2018-12-14 MED ORDER — PANTOPRAZOLE SODIUM 40 MG PO TBEC
40.0000 mg | DELAYED_RELEASE_TABLET | Freq: Every day | ORAL | Status: DC
  Administered 2018-12-15 – 2018-12-16 (×2): 40 mg via ORAL
  Filled 2018-12-14 (×2): qty 1

## 2018-12-14 MED ORDER — TORSEMIDE 20 MG PO TABS
50.0000 mg | ORAL_TABLET | Freq: Every day | ORAL | Status: DC
  Administered 2018-12-15 – 2018-12-16 (×2): 50 mg via ORAL
  Filled 2018-12-14 (×2): qty 3

## 2018-12-14 MED ORDER — INSULIN ASPART 100 UNIT/ML ~~LOC~~ SOLN
0.0000 [IU] | Freq: Three times a day (TID) | SUBCUTANEOUS | Status: DC
  Administered 2018-12-15 (×3): 3 [IU] via SUBCUTANEOUS
  Administered 2018-12-16: 2 [IU] via SUBCUTANEOUS
  Filled 2018-12-14 (×4): qty 1

## 2018-12-14 MED ORDER — METFORMIN HCL 500 MG PO TABS: 500.0000 mg | ORAL_TABLET | Freq: Two times a day (BID) | ORAL | Status: DC

## 2018-12-14 MED ORDER — MORPHINE SULFATE (PF) 4 MG/ML IV SOLN
4.0000 mg | Freq: Once | INTRAVENOUS | Status: AC
  Administered 2018-12-14: 4 mg via INTRAVENOUS
  Filled 2018-12-14: qty 1

## 2018-12-14 MED ORDER — ACETAMINOPHEN 325 MG PO TABS: 650.0000 mg | ORAL_TABLET | ORAL | Status: DC | PRN

## 2018-12-14 MED ORDER — SOTALOL HCL 80 MG PO TABS
80.0000 mg | ORAL_TABLET | Freq: Two times a day (BID) | ORAL | Status: DC
  Administered 2018-12-15 – 2018-12-16 (×3): 80 mg via ORAL
  Filled 2018-12-14 (×4): qty 1

## 2018-12-14 MED ORDER — ONDANSETRON HCL 4 MG/2ML IJ SOLN
4.0000 mg | Freq: Once | INTRAMUSCULAR | Status: AC
  Administered 2018-12-14: 4 mg via INTRAVENOUS
  Filled 2018-12-14: qty 2

## 2018-12-14 MED ORDER — OXYCODONE HCL 5 MG PO TABS
5.0000 mg | ORAL_TABLET | ORAL | Status: DC | PRN
  Administered 2018-12-14 – 2018-12-15 (×2): 5 mg via ORAL
  Filled 2018-12-14 (×2): qty 1

## 2018-12-14 MED ORDER — APIXABAN 5 MG PO TABS
5.0000 mg | ORAL_TABLET | Freq: Two times a day (BID) | ORAL | Status: DC
  Administered 2018-12-14 – 2018-12-16 (×4): 5 mg via ORAL
  Filled 2018-12-14 (×4): qty 1

## 2018-12-14 MED ORDER — CYCLOBENZAPRINE HCL 10 MG PO TABS
5.0000 mg | ORAL_TABLET | Freq: Three times a day (TID) | ORAL | Status: DC | PRN
  Administered 2018-12-14 – 2018-12-15 (×2): 10 mg via ORAL
  Filled 2018-12-14 (×2): qty 1

## 2018-12-14 MED ORDER — LORATADINE 10 MG PO TABS
10.0000 mg | ORAL_TABLET | Freq: Every day | ORAL | Status: DC
  Administered 2018-12-15 – 2018-12-16 (×2): 10 mg via ORAL
  Filled 2018-12-14 (×2): qty 1

## 2018-12-14 MED ORDER — LISINOPRIL 5 MG PO TABS
2.5000 mg | ORAL_TABLET | Freq: Every day | ORAL | Status: DC
  Administered 2018-12-15 – 2018-12-16 (×2): 2.5 mg via ORAL
  Filled 2018-12-14 (×2): qty 1

## 2018-12-14 MED ORDER — ALPRAZOLAM 1 MG PO TABS
0.5000 mg | ORAL_TABLET | Freq: Three times a day (TID) | ORAL | Status: DC | PRN
  Administered 2018-12-15 (×2): 1 mg via ORAL
  Filled 2018-12-14 (×2): qty 1

## 2018-12-14 MED ORDER — ALBUTEROL SULFATE (2.5 MG/3ML) 0.083% IN NEBU: 2.5000 mg | INHALATION_SOLUTION | Freq: Four times a day (QID) | RESPIRATORY_TRACT | Status: DC | PRN

## 2018-12-14 MED ORDER — TIOTROPIUM BROMIDE MONOHYDRATE 18 MCG IN CAPS
18.0000 ug | ORAL_CAPSULE | Freq: Every day | RESPIRATORY_TRACT | Status: DC
  Administered 2018-12-15 – 2018-12-16 (×2): 18 ug via RESPIRATORY_TRACT
  Filled 2018-12-14: qty 5

## 2018-12-14 MED ORDER — SODIUM CHLORIDE 0.9% FLUSH
3.0000 mL | Freq: Once | INTRAVENOUS | Status: AC
  Administered 2018-12-14: 3 mL via INTRAVENOUS

## 2018-12-14 MED ORDER — ALBUTEROL SULFATE (2.5 MG/3ML) 0.083% IN NEBU: 2.5000 mg | INHALATION_SOLUTION | RESPIRATORY_TRACT | Status: DC | PRN

## 2018-12-14 MED ORDER — AZITHROMYCIN 250 MG PO TABS
250.0000 mg | ORAL_TABLET | Freq: Every day | ORAL | Status: DC
  Administered 2018-12-15: 250 mg via ORAL
  Filled 2018-12-14: qty 1

## 2018-12-14 MED ORDER — LINACLOTIDE 72 MCG PO CAPS
72.0000 ug | ORAL_CAPSULE | Freq: Every day | ORAL | Status: DC
  Administered 2018-12-15 – 2018-12-16 (×2): 72 ug via ORAL
  Filled 2018-12-14 (×2): qty 1

## 2018-12-14 MED ORDER — OMEGA-3-ACID ETHYL ESTERS 1 G PO CAPS
4.0000 g | ORAL_CAPSULE | Freq: Every day | ORAL | Status: DC
  Administered 2018-12-15 – 2018-12-16 (×2): 4 g via ORAL
  Filled 2018-12-14 (×2): qty 4

## 2018-12-14 MED ORDER — DOCUSATE SODIUM 100 MG PO CAPS
100.0000 mg | ORAL_CAPSULE | Freq: Every day | ORAL | Status: DC
  Administered 2018-12-15 – 2018-12-16 (×2): 100 mg via ORAL
  Filled 2018-12-14 (×2): qty 1

## 2018-12-14 MED ORDER — ATORVASTATIN CALCIUM 20 MG PO TABS
80.0000 mg | ORAL_TABLET | Freq: Every day | ORAL | Status: DC
  Administered 2018-12-14 – 2018-12-15 (×2): 80 mg via ORAL
  Filled 2018-12-14 (×2): qty 4

## 2018-12-14 MED ORDER — ASPIRIN 81 MG PO CHEW
81.0000 mg | CHEWABLE_TABLET | Freq: Every day | ORAL | Status: DC
  Administered 2018-12-15 – 2018-12-16 (×2): 81 mg via ORAL
  Filled 2018-12-14 (×2): qty 1

## 2018-12-14 MED ORDER — OXYCODONE-ACETAMINOPHEN 10-325 MG PO TABS: 1.0000 | ORAL_TABLET | ORAL | Status: DC | PRN

## 2018-12-14 MED ORDER — ISOSORBIDE MONONITRATE ER 60 MG PO TB24: 60.0000 mg | ORAL_TABLET | Freq: Every day | ORAL | Status: DC

## 2018-12-14 MED ORDER — MORPHINE SULFATE (PF) 2 MG/ML IV SOLN
1.0000 mg | INTRAVENOUS | Status: DC | PRN
  Administered 2018-12-15 – 2018-12-16 (×5): 1 mg via INTRAVENOUS
  Filled 2018-12-14 (×5): qty 1

## 2018-12-14 MED ORDER — FLUTICASONE FUROATE-VILANTEROL 100-25 MCG/INH IN AEPB
1.0000 | INHALATION_SPRAY | Freq: Every day | RESPIRATORY_TRACT | Status: DC
  Administered 2018-12-15 – 2018-12-16 (×2): 1 via RESPIRATORY_TRACT
  Filled 2018-12-14: qty 28

## 2018-12-14 NOTE — ED Triage Notes (Signed)
C/O chest pain x 30 minutes PTA.  C/O pain to left chest radiating to left elbow.  States recently seen through ED and DX with bronchitis. Given Z-Pack and Prednisone.  States prednisone sometimes "puts me in Afib"

## 2018-12-14 NOTE — Progress Notes (Signed)
Family Meeting Note  Advance Directive yes Today a meeting took place with the pt patient comes in for chest pain. Has history of CAD and status post CABG. Patient has risk factor with hypertension hyperlipidemia and diabetes. So far workup was negative. He is being admitted for chest pain rule out. Code status discussed patient wants to be full code. Time spent during discussion: 16 mins [name], MD

## 2018-12-14 NOTE — ED Provider Notes (Signed)
[facility] Emergency Department Provider Note ____________________________________________   First MD Initiated Contact with Patient 12/14/18 1929     (approximate)  I have reviewed the triage vital signs and the nursing notes.   HISTORY  Chief Complaint Chest Pain    HPI Lawrence [name] is a 63 y.o. male with PMH as noted below including atrial fibrillation, CAD, and CHF who presents with chest pain, acute onset several hours ago, persistent course since then, described both as tightness and as sharp pain radiating to left arm.  He states that it is associated with some shortness of breath.  The patient was seen in the ED few days ago for cough and shortness of breath and given a Z-Pak and prednisone but states that the shortness of breath has not significantly improved.  Past Medical History:  Diagnosis Date  . A-fib ([place])   . Anemia   . Anginal pain ([place])   . Anxiety   . Arthritis   . Asthma   . CAD (coronary artery disease)    a. 2002 CABGx2 (LIMA->LAD, VG->VG->OM1);  b. 09/2012 DES->OM;  c. 03/2015 PTCA of LAD [facility]) in setting of atretic LIMA; d. 05/2015 Cath [facility]): nonobs dzs; e. 06/2015 Cath ([facility]): LM nl, LAD 45p/d ISR, 50d, D1/2 small, LCX 50p/d ISR, OM1 70ost, 30 ISR, VG->OM1 50ost, 36m LIMA->LAD 99p/d - atretic, RCA dom, nl; f.cath 10/16: 40-50%(FFR 0.90) pLAD, 75% (FFR 0.77) mLAD s/p PCI/DES, oRCA 40% (FFR0.95)  . Celiac disease   . Chronic diastolic CHF (congestive heart failure) ([place]    a. 06/2009 Echo: EF 60-65%, Gr 1 DD, triv AI, mildly dil LA, nl RV.  .Marland KitchenCOPD (chronic obstructive pulmonary disease) ([place]    a. Chronic bronchitis and emphysema.  . Diverticulosis   . Dysrhythmia   . Essential hypertension   . GERD (gastroesophageal reflux disease)   . History of tobacco abuse    a. Quit 2014.  .Marland KitchenPancreatitis   . PSVT (paroxysmal supraventricular tachycardia) ([place]    a. 10/2012 Noted on Zio Patch.  . Tubular adenoma of colon   . Type  II diabetes mellitus ([facility]     Patient Active Problem List   Diagnosis Date Noted  . Trigger thumb of right hand 11/28/2018  . Eye pain, left 08/18/2018  . Acute on chronic heart failure ([place] 04/18/2018  . History of adenomatous polyp of colon 04/05/2018  . Abdominal pain, chronic, epigastric 11/06/2017  . Bilateral flank pain 03/24/2017  . Dyspnea 04/03/2016  . Hypotension 04/03/2016  . CKD (chronic kidney disease) stage 2, GFR 60-89 ml/min 04/03/2016  . Anemia 04/03/2016  . Paroxysmal atrial fibrillation ([place] 12/23/2015  . OSA (obstructive sleep apnea) 12/10/2015  . Left inguinal hernia 11/07/2015  . Anxiety 11/07/2015  . Unstable angina ([place] 10/05/2015  . Back pain with left-sided radiculopathy 09/30/2015  . Nocturnal hypoxia 09/06/2015  . BPH (benign prostatic hyperplasia) 08/01/2015  . Chronic diastolic CHF (congestive heart failure) ([place]   . Angina pectoris ([place]   . Chest pain 07/11/2015  . COPD (chronic obstructive pulmonary disease) ([place] 07/03/2015  . CAD (coronary artery disease) 06/26/2015  . HTN (hypertension) 06/26/2015  . 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[place] 06/26/2015  . Achalasia 07/24/2014  . GERD (gastroesophageal reflux disease) 06/07/2014  . Former tobacco use 04/11/2013  . HLD (hyperlipidemia) 04/09/2013    Past Surgical History:  Procedure Laterality Date  . BYPASS GRAFT    . CARDIAC CATHETERIZATION N/A 07/12/2015   rocedure: Left Heart  Cath and Cors/Grafts Angiography;  Surgeon: Belva Crome, MD;  Location: [place] CV LAB;  Service: Cardiovascular;  Laterality: N/A;  . CARDIAC CATHETERIZATION Right 10/07/2015   Procedure: Left Heart Cath and Cors/Grafts Angiography;  Surgeon: Dionisio David, MD;  Location: [place] CV LAB;  Service: Cardiovascular;  Laterality: Right;  . CARDIAC CATHETERIZATION N/A 04/06/2016   Procedure: Left Heart Cath and Coronary Angiography;  Surgeon: Yolonda Kida, MD;  Location: [place] CV  LAB;  Service: Cardiovascular;  Laterality: N/A;  . CARDIAC CATHETERIZATION  04/06/2016   Procedure: Bypass Graft Angiography;  Surgeon: Yolonda Kida, MD;  Location: [place] CV LAB;  Service: Cardiovascular;;  . CARDIAC CATHETERIZATION N/A 11/02/2016   Procedure: Left Heart Cath and Cors/Grafts Angiography and possible PCI;  Surgeon: Yolonda Kida, MD;  Location: [place] CV LAB;  Service: Cardiovascular;  Laterality: N/A;  . CARDIAC CATHETERIZATION N/A 11/02/2016   Procedure: Coronary Stent Intervention;  Surgeon: Yolonda Kida, MD;  Location: [place] CV LAB;  Service: Cardiovascular;  Laterality: N/A;  . CHOLECYSTECTOMY    . COLONOSCOPY WITH PROPOFOL N/A 04/01/2018   Procedure: COLONOSCOPY WITH PROPOFOL;  Surgeon: Manya Silvas, MD;  Location: [facility] ENDOSCOPY;  Service: Endoscopy;  Laterality: N/A;  . ESOPHAGEAL DILATION    . ESOPHAGOGASTRODUODENOSCOPY (EGD) WITH PROPOFOL N/A 04/01/2018   Procedure: ESOPHAGOGASTRODUODENOSCOPY (EGD) WITH PROPOFOL;  Surgeon: Manya Silvas, MD;  Location: [facility] ENDOSCOPY;  Service: Endoscopy;  Laterality: N/A;  . TONSILLECTOMY    . VASCULAR SURGERY      Prior to Admission medications   Medication Sig Start Date End Date Taking? Authorizing Provider  albuterol (PROVENTIL HFA;VENTOLIN HFA) 108 (90 Base) MCG/ACT inhaler Inhale 2 puffs into the lungs every 4 (four) hours as needed for wheezing or shortness of breath. Reported on 04/08/2016 10/21/17   Birdie Sons, MD  albuterol (PROVENTIL) (2.5 MG/3ML) 0.083% nebulizer solution Take 3 mLs (2.5 mg total) by nebulization every 6 (six) hours as needed for wheezing or shortness of breath. 01/04/18   Birdie Sons, MD  allopurinol (ZYLOPRIM) 300 MG tablet Take 1 tablet (300 mg total) by mouth 2 (two) times daily. 10/05/18   Birdie Sons, MD  ALPRAZolam Duanne Moron) 1 MG tablet TAKE ONE-HALF TO ONE TABLET BY MOUTH THREE TIMES A DAY AS NEEDED Patient taking differently: Take 0.5-1 mg by  mouth 3 (three) times daily as needed for anxiety.  10/06/18   Birdie Sons, MD  aspirin 81 MG chewable tablet Chew 81 mg by mouth daily.    [provider]  atorvastatin (LIPITOR) 80 MG tablet TAKE ONE TABLET BY MOUTH AT BEDTIME. Patient taking differently: Take 80 mg by mouth at bedtime.  09/19/18   Birdie Sons, MD  azithromycin (ZITHROMAX) 250 MG tablet Take 1 tablet (250 mg total) by mouth daily. 12/11/18   Harvest Dark, MD  Blood Glucose Monitoring Suppl (ACCU-CHEK AVIVA PLUS) w/Device KIT CHECK BLOOD SUGAR EVERY DAY 12/08/17   Birdie Sons, MD  cetirizine (ZYRTEC) 10 MG tablet TAKE ONE TABLET AT BEDTIME Patient taking differently: Take 10 mg by mouth at bedtime.  07/11/18   Birdie Sons, MD  cyclobenzaprine (FLEXERIL) 5 MG tablet TAKE 1 TO 2 TABLETS BY MOUTH THREE TIMESDAILY AS NEEDED (BACK PAIN) Patient taking differently: Take 5-10 mg by mouth 3 (three) times daily as needed (back pain).  11/18/18   Birdie Sons, MD  dapagliflozin propanediol (FARXIGA) 5 MG TABS tablet Take 5 mg by  mouth daily. 11/29/18   Birdie Sons, MD  docusate sodium (COLACE) 100 MG capsule Take 100 mg by mouth daily.     [provider]  ELIQUIS 5 MG TABS tablet TAKE 1 TABLET BY MOUTH TWICE A DAY. Patient taking differently: Take 5 mg by mouth 2 (two) times daily.  08/06/18   Birdie Sons, MD  fluticasone furoate-vilanterol (BREO ELLIPTA) 100-25 MCG/INH AEPB Inhale 1 puff into the lungs daily. 08/19/18   Devona Konig A, MD  glucose blood (ACCU-CHEK AVIVA PLUS) test strip Use to check blood sugar once a day 02/14/18   Birdie Sons, MD  isosorbide mononitrate (IMDUR) 60 MG 24 hr tablet Take 1 tablet (60 mg total) by mouth daily. Patient taking differently: Take 60 mg by mouth 2 (two) times daily.  11/04/16   Henreitta Leber, MD  ketoconazole (NIZORAL) 2 % cream Apply 1 application topically daily. 11/28/18   Birdie Sons, MD  LINZESS 72 MCG capsule Take 72 mcg by mouth  daily.    [provider]  lisinopril (PRINIVIL,ZESTRIL) 2.5 MG tablet TAKE 1 TABLET BY MOUTH DAILY Patient taking differently: Take 2.5 mg by mouth daily.  10/20/18   Birdie Sons, MD  magnesium oxide (MAG-OX) 400 (241.3 Mg) MG tablet TAKE 1 TABLET BY MOUTH DAILY Patient taking differently: Take 400 mg by mouth daily.  10/28/18   Alisa Graff, FNP  metFORMIN (GLUCOPHAGE) 500 MG tablet Take 1 tablet (500 mg total) by mouth 2 (two) times daily with a meal. 06/15/18   [name], [name], MD  nitroGLYCERIN (NITROSTAT) 0.4 MG SL tablet Place 1 tablet (0.4 mg total) under the tongue every 5 (five) minutes as needed for chest pain. Reported on 05/26/2016 11/28/18   Birdie Sons, MD  omega-3 acid ethyl esters (LOVAZA) 1 g capsule TAKE FOUR CAPSULES BY MOUTH DAILY Patient taking differently: Take 4 g by mouth daily.  06/24/18   Birdie Sons, MD  omeprazole (PRILOSEC) 40 MG capsule Take 1 capsule by mouth 2 (two) times daily. 04/22/18 04/22/19  [provider]  oxyCODONE-acetaminophen (PERCOCET) 10-325 MG tablet Take 1 tablet by mouth every 4 (four) hours as needed. 12/01/18   Birdie Sons, MD  OXYGEN Inhale 2 L into the lungs. Wears ATC and at night    [provider]  predniSONE (DELTASONE) 10 MG tablet Take 1 tablet (10 mg total) by mouth daily. Day 1-3: take 4 tablets PO daily Day 4-6: take 3 tablets PO daily Day 7-9: take 2 tablets PO daily Day 10-12: take 1 tablet PO daily 12/11/18   Harvest Dark, MD  sotalol (BETAPACE) 80 MG tablet Take 80 mg by mouth 2 (two) times daily.  10/05/18   [provider]  SPIRIVA HANDIHALER 18 MCG inhalation capsule INHALE ONE CAPSULE AS DIRECTED ONCE A DAY Patient taking differently: Place 18 mcg into inhaler and inhale daily.  02/03/18   Birdie Sons, MD  tamsulosin (FLOMAX) 0.4 MG CAPS capsule Take 0.4 mg by mouth daily.     [provider]  torsemide (DEMADEX) 100 MG tablet Take 0.5 tablets by mouth daily.  10/11/17   [provider]    Allergies Demerol [meperidine]; Jardiance [empagliflozin]; Prednisone; Sulfa antibiotics; Albuterol sulfate [albuterol]; and Morphine sulfate  Family History  Problem Relation Age of Onset  . Heart attack Mother   . Depression Mother   . Heart disease Mother   . COPD Mother   . Hypertension Mother   .  Heart attack Father   . Diabetes Father   . Depression Father   . Heart disease Father   . Cirrhosis Father   . Parkinson's disease Brother     Social History Social History   Tobacco Use  . Smoking status: Former Smoker    Last attempt to quit: 04/22/2013    Years since quitting: 5.6  . Smokeless tobacco: Never Used  Substance Use Topics  . Alcohol use: No    Comment: remotely quit alcohol use. Hx of heavy alcohol use.  . Drug use: No    Review of Systems  Constitutional: No fever. Eyes: No redness. ENT: No neck pain. Cardiovascular: Positive for chest pain. Respiratory: Positive for shortness of breath. Gastrointestinal: No vomiting.  Genitourinary: Negative for flank pain.  Musculoskeletal: Negative for back pain. Skin: Negative for rash. Neurological: Negative for headache.   ____________________________________________   PHYSICAL EXAM:  VITAL SIGNS: ED Triage Vitals  Enc Vitals Group     BP 12/14/18 1757 (!) 192/82     Pulse Rate 12/14/18 1757 70     Resp 12/14/18 1757 15     Temp --      Temp Source 12/14/18 1757 Oral     SpO2 12/14/18 1757 93 %     Weight 12/14/18 1756 248 lb (112.5 kg)     Height 12/14/18 1756 _0  (1.702 m)     Head Circumference --      Peak Flow --      Pain Score 12/14/18 1755 9     Pain Loc --      Pain Edu? --      Excl. in [place]? --     Constitutional: Alert and oriented.  Relatively well appearing and in no acute distress. Eyes: Conjunctivae are normal.  Head: Atraumatic. Nose: No congestion/rhinnorhea. Mouth/Throat: Mucous membranes are moist.   Neck: Normal range of motion.   Cardiovascular: Normal rate, regular rhythm. Grossly normal heart sounds.  Good peripheral circulation. Respiratory: Normal respiratory effort.  No retractions. Lungs CTAB. Gastrointestinal: No distention.  Musculoskeletal: Extremities warm and well perfused.  Neurologic:  Normal speech and language. No gross focal neurologic deficits are appreciated.  Skin:  Skin is warm and dry. No rash noted. Psychiatric: Mood and affect are normal. Speech and behavior are normal.  ____________________________________________   LABS (all labs ordered are listed, but only abnormal results are displayed)  Labs Reviewed  BASIC METABOLIC PANEL - Abnormal; Notable for the following components:      Result Value   Glucose, Bld 358 (*)    BUN 25 (*)    All other components within normal limits  CBC - Abnormal; Notable for the following components:   WBC 10.8 (*)    All other components within normal limits  TROPONIN I  TROPONIN I   ____________________________________________  EKG  ED ECG REPORT I, Arta Silence, the attending physician, personally viewed and interpreted this ECG.  Date: 12/14/2018 EKG Time: 1756 Rate: 68 Rhythm: normal sinus rhythm QRS Axis: normal Intervals: normal ST/T Wave abnormalities: normal Narrative Interpretation: no evidence of acute ischemia  ____________________________________________  RADIOLOGY  CXR: No focal infiltrate.  Left basilar scarring with no acute change  ____________________________________________   PROCEDURES  Procedure(s) performed: No  Procedures  Critical Care performed: No ____________________________________________   INITIAL IMPRESSION / ASSESSMENT AND PLAN / ED COURSE  Pertinent labs & imaging results that were available during my care of the patient were reviewed by me and considered in my medical  decision making (see chart for details).  63 year old male with history of CAD and other PMH as noted above presents  with acute onset of somewhat atypical and nonexertional chest pain for the last few hours, associated with shortness of breath and cough over the last several days.  I reviewed the past medical records in [place].  The patient was seen in the ED 3 days ago with shortness of breath and diagnosed with likely bronchitis.  He was discharged with azithromycin and prednisone.  The patient states that the shortness of breath has not significantly improved, and the chest pain began acutely.  He also reports that previously he has gone into A. fib when put on prednisone.  On exam, the patient is relatively well-appearing.  His vital signs are normal except for hypertension.  His exam is otherwise relatively unremarkable.  EKG is sinus and shows no ischemic findings.  Overall I have a low suspicion for ACS on this presentation although the patient's risk is elevated because of his history.  Differential would also include persistent symptoms with bronchitis, musculoskeletal pain from coughing, or possibly pneumonia.  The patient has no signs or symptoms to suggest DVT or PE.  There is also no evidence of aortic dissection or other vascular cause.  We will obtain chest x-ray, labs including cardiac enzymes, and reassess.  ----------------------------------------- 9:46 PM on 12/14/2018 -----------------------------------------  The initial troponin is negative.  The patient has no infiltrate on x-ray.  However he does continue to have active pain with minimal improvement after the medication.  Given his elevated CAD risk, I will admit him for further observation and ACS rule out.  I signed the patient out to the hospitalist Dr. Posey Pronto at approximately 9:45 PM. ____________________________________________   FINAL CLINICAL IMPRESSION(S) / ED DIAGNOSES  Final diagnoses:  Chest pain, unspecified type      NEW MEDICATIONS STARTED DURING THIS VISIT:  New Prescriptions   No medications on file     Note:   This document was prepared using Dragon voice recognition software and may include unintentional dictation errors.    Arta Silence, MD 12/14/18 2146

## 2018-12-14 NOTE — H&P (Signed)
[place] [facility] [place] NAME: Lawrence [name]    MR#:  [id number]  DATE OF BIRTH:  1956-02-19  DATE OF ADMISSION:  12/14/2018  PRIMARY CARE PHYSICIAN: Birdie Sons, MD   REQUESTING/REFERRING PHYSICIAN: *dr Cherylann Banas  CHIEF COMPLAINT:  chest pain on and off since 4 to 5 days.  HISTORY OF PRESENT ILLNESS:  Lawrence [name]  is a 63 y.o. male with a known history of CAD status post CABG, recent cath in July 2019 without major blockage, asthma, a fib on anticoagulation comes to the emergency room with chest pain mid substernal radiating to the left arm. Patient states he is had pain since 3 to 4 days which has been constant without any relieving and aggravating factors. In the ER patient is hemodynamically stable EKG does not show acute ST elevation her depression. Cardiac enzymes is negative.  When coronary artery disease and ongoing chest pain patient is being admitted for overnight observation.  Recently was treated as outpatient for acute bronchitis. He has last dose of Zithromax and prednisone which he is still taking.    PAST MEDICAL HISTORY:   Past Medical History:  Diagnosis Date  . A-fib ([place])   . Anemia   . Anginal pain ([place])   . Anxiety   . Arthritis   . Asthma   . CAD (coronary artery disease)    a. 2002 CABGx2 (LIMA->LAD, VG->VG->OM1);  b. 09/2012 DES->OM;  c. 03/2015 PTCA of LAD [facility]) in setting of atretic LIMA; d. 05/2015 Cath [facility]): nonobs dzs; e. 06/2015 Cath ([facility]): LM nl, LAD 45p/d ISR, 50d, D1/2 small, LCX 50p/d ISR, OM1 70ost, 30 ISR, VG->OM1 50ost, 103m LIMA->LAD 99p/d - atretic, RCA dom, nl; f.cath 10/16: 40-50%(FFR 0.90) pLAD, 75% (FFR 0.77) mLAD s/p PCI/DES, oRCA 40% (FFR0.95)  . Celiac disease   . Chronic diastolic CHF (congestive heart failure) ([place]    a. 06/2009 Echo: EF 60-65%, Gr 1 DD, triv AI, mildly dil LA, nl RV.  .Marland KitchenCOPD (chronic obstructive pulmonary disease) ([place]    a. Chronic bronchitis and emphysema.   . Diverticulosis   . Dysrhythmia   . Essential hypertension   . GERD (gastroesophageal reflux disease)   . History of tobacco abuse    a. Quit 2014.  .Marland KitchenPancreatitis   . PSVT (paroxysmal supraventricular tachycardia) ([place]    a. 10/2012 Noted on Zio Patch.  . Tubular adenoma of colon   . Type II diabetes mellitus ([place]     PAST SURGICAL HISTOIRY:   Past Surgical History:  Procedure Laterality Date  . BYPASS GRAFT    . CARDIAC CATHETERIZATION N/A 07/12/2015   rocedure: Left Heart Cath and Cors/Grafts Angiography;  Surgeon: HBelva Crome MD;  [place] [facility];  Service: Cardiovascular;  Laterality: N/A;  . CARDIAC CATHETERIZATION Right 10/07/2015   Procedure: Left Heart Cath and Cors/Grafts Angiography;  Surgeon: SDionisio David MD;  [facility] [place] [facility];  Service: Cardiovascular;  Laterality: Right;  . CARDIAC CATHETERIZATION N/A 04/06/2016   Procedure: Left Heart Cath and Coronary Angiography;  Surgeon: DYolonda Kida MD;  [place];  Service: Cardiovascular;  Laterality: N/A;  . CARDIAC CATHETERIZATION  04/06/2016   Procedure: Bypass Graft Angiography;  Surgeon: DYolonda Kida MD;  [place];  Service: Cardiovascular;;  . CARDIAC CATHETERIZATION N/A 11/02/2016   Procedure: Left Heart Cath and Cors/Grafts Angiography and possible PCI;  Surgeon: DYolonda Kida MD;  Location:  [place] CV LAB;  Service: Cardiovascular;  Laterality: N/A;  . CARDIAC CATHETERIZATION N/A 11/02/2016   Procedure: Coronary Stent Intervention;  Surgeon: Yolonda Kida, MD;  Location: [place] CV LAB;  Service: Cardiovascular;  Laterality: N/A;  . CHOLECYSTECTOMY    . COLONOSCOPY WITH PROPOFOL N/A 04/01/2018   Procedure: COLONOSCOPY WITH PROPOFOL;  Surgeon: Manya Silvas, MD;  Location: [facility] END[facility]COPY;  Service: Endoscopy;  Laterality: N/A;  . ESOPHAGEAL DILATION    . ESOPHAGOGASTRODUODENOSCOPY (EGD) WITH PROPOFOL N/A  04/01/2018   Procedure: ESOPHAGOGASTRODUODENOSCOPY (EGD) WITH PROPOFOL;  Surgeon: Manya Silvas, MD;  Location: [facility] ENDOSCOPY;  Service: Endoscopy;  Laterality: N/A;  . TONSILLECTOMY    . VASCULAR SURGERY      SOCIAL HISTORY:   Social History   Tobacco Use  . Smoking status: Former Smoker    Last attempt to quit: 04/22/2013    Years since quitting: 5.6  . Smokeless tobacco: Never Used  Substance Use Topics  . Alcohol use: No    Comment: remotely quit alcohol use. Hx of heavy alcohol use.    FAMILY HISTORY:   Family History  Problem Relation Age of Onset  . Heart attack Mother   . Depression Mother   . Heart disease Mother   . COPD Mother   . Hypertension Mother   . Heart attack Father   . Diabetes Father   . Depression Father   . Heart disease Father   . Cirrhosis Father   . Parkinson's disease Brother     DRUG ALLERGIES:   Allergies  Allergen Reactions  . Demerol [Meperidine] Hives  . Jardiance [Empagliflozin] Other (See Comments)    Perineal pain  . Prednisone Other (See Comments) and Hypertension    Pt states that this medication puts him in A-fib   . Sulfa Antibiotics Hives  . Albuterol Sulfate [Albuterol] Palpitations and Other (See Comments)    Pt currently uses this medication.    . Morphine Sulfate Nausea And Vomiting, Rash and Other (See Comments)    Pt states that he is only allergic to the tablet form of this medication.      REVIEW OF SYSTEMS:  Review of Systems  Constitutional: Negative for chills, fever and weight loss.  HENT: Negative for ear discharge, ear pain and nosebleeds.   Eyes: Negative for blurred vision, pain and discharge.  Respiratory: Negative for sputum production, shortness of breath, wheezing and stridor.   Cardiovascular: Positive for chest pain. Negative for palpitations, orthopnea and PND.  Gastrointestinal: Negative for abdominal pain, diarrhea, nausea and vomiting.  Genitourinary: Negative for frequency and urgency.   Musculoskeletal: Negative for back pain and joint pain.  Neurological: Negative for sensory change, speech change, focal weakness and weakness.  Psychiatric/Behavioral: Negative for depression and hallucinations. The patient is not nervous/anxious.      MEDICATIONS AT HOME:   Prior to Admission medications   Medication Sig Start Date End Date Taking? Authorizing Provider  allopurinol (ZYLOPRIM) 300 MG tablet Take 1 tablet (300 mg total) by mouth 2 (two) times daily. 10/05/18  Yes Birdie Sons, MD  ALPRAZolam (XANAX) 1 MG tablet TAKE ONE-HALF TO ONE TABLET BY MOUTH THREE TIMES A DAY AS NEEDED Patient taking differently: Take 1 mg by mouth 3 (three) times daily.  10/06/18  Yes Birdie Sons, MD  aspirin 81 MG chewable tablet Chew 81 mg by mouth daily.   Yes [provider]  atorvastatin (LIPITOR) 80 MG tablet TAKE ONE TABLET BY MOUTH AT BEDTIME.  Patient taking differently: Take 80 mg by mouth at bedtime.  09/19/18  Yes Birdie Sons, MD  azithromycin (ZITHROMAX) 250 MG tablet Take 1 tablet (250 mg total) by mouth daily. 12/11/18  Yes Harvest Dark, MD  cetirizine (ZYRTEC) 10 MG tablet TAKE ONE TABLET AT BEDTIME Patient taking differently: Take 10 mg by mouth at bedtime.  07/11/18  Yes Birdie Sons, MD  cyclobenzaprine (FLEXERIL) 5 MG tablet TAKE 1 TO 2 TABLETS BY MOUTH THREE TIMESDAILY AS NEEDED (BACK PAIN) Patient taking differently: Take 5-10 mg by mouth 3 (three) times daily as needed (back pain).  11/18/18  Yes Birdie Sons, MD  dapagliflozin propanediol (FARXIGA) 5 MG TABS tablet Take 5 mg by mouth daily. 11/29/18  Yes Birdie Sons, MD  docusate sodium (COLACE) 100 MG capsule Take 100 mg by mouth daily.    Yes [provider]  ELIQUIS 5 MG TABS tablet TAKE 1 TABLET BY MOUTH TWICE A DAY. Patient taking differently: Take 5 mg by mouth 2 (two) times daily.  08/06/18  Yes Birdie Sons, MD  fluticasone furoate-vilanterol (BREO ELLIPTA) 100-25 MCG/INH  AEPB Inhale 1 puff into the lungs daily. 08/19/18  Yes Allyne Gee, MD  isosorbide mononitrate (IMDUR) 60 MG 24 hr tablet Take 1 tablet (60 mg total) by mouth daily. 11/04/16  Yes Sainani, Belia Heman, MD  ketoconazole (NIZORAL) 2 % cream Apply 1 application topically daily. 11/28/18  Yes Birdie Sons, MD  LINZESS 72 MCG capsule Take 72 mcg by mouth daily.   Yes [provider]  lisinopril (PRINIVIL,ZESTRIL) 2.5 MG tablet TAKE 1 TABLET BY MOUTH DAILY Patient taking differently: Take 2.5 mg by mouth daily.  10/20/18  Yes Birdie Sons, MD  magnesium oxide (MAG-OX) 400 (241.3 Mg) MG tablet TAKE 1 TABLET BY MOUTH DAILY Patient taking differently: Take 400 mg by mouth daily.  10/28/18  Yes Alisa Graff, FNP  metFORMIN (GLUCOPHAGE) 500 MG tablet Take 1 tablet (500 mg total) by mouth 2 (two) times daily with a meal. 06/15/18  Yes Birdie Sons, MD  omega-3 acid ethyl esters (LOVAZA) 1 g capsule TAKE FOUR CAPSULES BY MOUTH DAILY Patient taking differently: Take 4 g by mouth daily.  06/24/18  Yes Birdie Sons, MD  omeprazole (PRILOSEC) 40 MG capsule Take 1 capsule by mouth 2 (two) times daily. 04/22/18 04/22/19 Yes [provider]  oxyCODONE-acetaminophen (PERCOCET) 10-325 MG tablet Take 1 tablet by mouth every 4 (four) hours as needed. Patient taking differently: Take 1 tablet by mouth every 4 (four) hours.  12/01/18  Yes Birdie Sons, MD  predniSONE (DELTASONE) 10 MG tablet Take 1 tablet (10 mg total) by mouth daily. Day 1-3: take 4 tablets PO daily Day 4-6: take 3 tablets PO daily Day 7-9: take 2 tablets PO daily Day 10-12: take 1 tablet PO daily 12/11/18  Yes Paduchowski, Lennette Bihari, MD  sotalol (BETAPACE) 80 MG tablet Take 80 mg by mouth 2 (two) times daily.  10/05/18  Yes [provider]  SPIRIVA HANDIHALER 18 MCG inhalation capsule INHALE ONE CAPSULE AS DIRECTED ONCE A DAY Patient taking differently: Place 18 mcg into inhaler and inhale daily.  02/03/18  Yes Birdie Sons, MD  tamsulosin (FLOMAX) 0.4 MG CAPS capsule Take 0.4 mg by mouth daily.    Yes [provider]  torsemide (DEMADEX) 100 MG tablet Take 0.5 tablets by mouth daily. 10/11/17  Yes [provider]  albuterol (PROVENTIL HFA;VENTOLIN HFA) 108 (90 Base)  MCG/ACT inhaler Inhale 2 puffs into the lungs every 4 (four) hours as needed for wheezing or shortness of breath. Reported on 04/08/2016 10/21/17   Birdie Sons, MD  albuterol (PROVENTIL) (2.5 MG/3ML) 0.083% nebulizer solution Take 3 mLs (2.5 mg total) by nebulization every 6 (six) hours as needed for wheezing or shortness of breath. 01/04/18   Birdie Sons, MD  Blood Glucose Monitoring Suppl (ACCU-CHEK AVIVA PLUS) w/Device KIT CHECK BLOOD SUGAR EVERY DAY 12/08/17   Birdie Sons, MD  glucose blood (ACCU-CHEK AVIVA PLUS) test strip Use to check blood sugar once a day 02/14/18   Birdie Sons, MD  nitroGLYCERIN (NITROSTAT) 0.4 MG SL tablet Place 1 tablet (0.4 mg total) under the tongue every 5 (five) minutes as needed for chest pain. Reported on 05/26/2016 11/28/18   Birdie Sons, MD  OXYGEN Inhale 2 L into the lungs. Wears ATC and at night    [provider]      VITAL SIGNS:  Blood pressure 138/81, pulse 62, resp. rate 17, height _0  (1.702 m), weight 112.5 kg, SpO2 94 %.  PHYSICAL EXAMINATION:  GENERAL:  63 y.o.-year-old patient lying in the bed with no acute distress. EYES: Pupils equal, round, reactive to light and accommodation. No scleral icterus. Extraocular muscles intact.  HEENT: Head atraumatic, normocephalic. Oropharynx and nasopharynx clear.  NECK:  Supple, no jugular venous distention. No thyroid enlargement, no tenderness.  LUNGS: Normal breath sounds bilaterally, no wheezing, rales,rhonchi or crepitation. No use of accessory muscles of respiration.  CARDIOVASCULAR: S1, S2 normal. No murmurs, rubs, or gallops.  ABDOMEN: Soft, nontender, nondistended. Bowel sounds present. No organomegaly or  massabdominalobesity EXTREMITIES: No pedal edema, cyanosis, or clubbing.  NEUROLOGIC: Cranial nerves II through XII are intact. Muscle strength 5/5 in all extremities. Sensation intact. Gait not checked.  PSYCHIATRIC: The patient is alert and oriented x 3.  SKIN: No obvious rash, lesion, or ulcer.   LABORATORY PANEL:   CBC Recent Labs  Lab 12/14/18 1801  WBC 10.8*  HGB 16.3  HCT 50.6  PLT 188   ------------------------------------------------------------------------------------------------------------------  Chemistries  Recent Labs  Lab 12/14/18 1801  NA 136  K 4.5  CL 99  CO2 31  GLUCOSE 358*  BUN 25*  CREATININE 1.03  CALCIUM 9.4   ------------------------------------------------------------------------------------------------------------------  Cardiac Enzymes Recent Labs  Lab 12/14/18 2144  TROPONINI <0.03   ------------------------------------------------------------------------------------------------------------------  RADIOLOGY:  Dg Chest 2 View  Result Date: 12/14/2018 CLINICAL DATA:  Chest pain. EXAM: CHEST - 2 VIEW COMPARISON:  Radiographs of December 11, 2018. FINDINGS: The heart size and mediastinal contours are within normal limits. No pneumothorax or pleural effusion is noted. Sternotomy wires are noted. Right lung is clear. Stable left basilar subsegmental atelectasis or scarring. The visualized skeletal structures are unremarkable. IMPRESSION: Stable left basilar subsegmental atelectasis or scarring. Electronically Signed   By: Marijo Conception, M.D.   On: 12/14/2018 18:25    EKG:    IMPRESSION AND PLAN:   Lawrence [name]  is a 63 y.o. male with a known history of CAD status post CABG, recent cath in July 2019 without major blockage, asthma, a fib on anticoagulation comes to the emergency room with chest pain mid substernal radiating to the left arm. Patient states he is had pain since 3 to 4 days which has been constant without any relieving and  aggravating factors.  1. Chest pain in the setting of CAD with history of CABG -cath done in July 2019 showed 1. Moderate  diffuse native vessel Coronary artery disease 2. Mild In stent restenosis in mid-LAD 3. Patent vein graft to OM -will continue all cardiac meds. -Consultation with Dr. Josefa Half. Message sent via secure chat -cycle cardiac enzymes times three -myoview stress test for tomorrow  2. Chronic anxiety continue Xanax  3. Hyperlipidemia continue high dose statins  4. Hypertension continue home meds  Discussed with patient no family in the ER All the records are reviewed and case discussed with ED provider.   CODE STATUS: full  TOTAL TIME TAKING CARE OF THIS PATIENT: *50* minutes.    [name] M.D on 12/14/2018 at 10:34 PM  Between 7am to 6pm - Pager - [phone]  After 6pm go to [email or web address] - password [email or web address] [facility]  [facility]  Office  [phone]  CC: Primary care physician; Birdie Sons, MD

## 2018-12-14 NOTE — ED Notes (Signed)
ED Provider at bedside. 

## 2018-12-14 NOTE — ED Notes (Signed)
ED TO INPATIENT HANDOFF REPORT  ED Nurse Name and Phone #: Delsa Sale [phone]  Name/Age/Gender Lawrence [name] 63 y.o. male Room/Bed: ED12A/ED12A  Code Status   Code Status: Prior  Home/SNF/Other home {Patient oriented to:A&O x4 Is this baseline? Yes   Triage Complete: Triage complete  Chief Complaint chest pain  Triage Note C/O chest pain x 30 minutes PTA.  C/O pain to left chest radiating to left elbow.  States recently seen through ED and DX with bronchitis. Given Z-Pack and Prednisone.  States prednisone sometimes "puts me in Afib"    Allergies Allergies  Allergen Reactions  . Demerol [Meperidine] Hives  . Jardiance [Empagliflozin] Other (See Comments)    Perineal pain  . Prednisone Other (See Comments) and Hypertension    Pt states that this medication puts him in A-fib   . Sulfa Antibiotics Hives  . Albuterol Sulfate [Albuterol] Palpitations and Other (See Comments)    Pt currently uses this medication.    . Morphine Sulfate Nausea And Vomiting, Rash and Other (See Comments)    Pt states that he is only allergic to the tablet form of this medication.      Level of Care/Admitting Diagnosis ED Disposition    ED Disposition Condition [facility] Area: [place] [100120]  Level of Care: Telemetry [5]  Diagnosis: Chest pain [315176]  Admitting Physician: Odessa Fleming  Attending Physician: [name] [[id number]]  PT Class (Do Not Modify): Observation [104]  PT Acc Code (Do Not Modify): Observation [10022]       Medical/Surgery History Past Medical History:  Diagnosis Date  . A-fib ([place])   . Anemia   . Anginal pain ([place])   . Anxiety   . Arthritis   . Asthma   . CAD (coronary artery disease)    a. 2002 CABGx2 (LIMA->LAD, VG->VG->OM1);  b. 09/2012 DES->OM;  c. 03/2015 PTCA of LAD [facility]) in setting of atretic LIMA; d. 05/2015 Cath [facility] nonobs dzs; e. 06/2015 Cath ([facility]): LM nl, LAD 45p/d ISR, 50d, D1/2 small, LCX 50p/d ISR,  OM1 70ost, 30 ISR, VG->OM1 50ost, 56m LIMA->LAD 99p/d - atretic, RCA dom, nl; f.cath 10/16: 40-50%(FFR 0.90) pLAD, 75% (FFR 0.77) mLAD s/p PCI/DES, oRCA 40% (FFR0.95)  . Celiac disease   . Chronic diastolic CHF (congestive heart failure) ([place]    a. 06/2009 Echo: EF 60-65%, Gr 1 DD, triv AI, mildly dil LA, nl RV.  .Marland KitchenCOPD (chronic obstructive pulmonary disease) ([place]    a. Chronic bronchitis and emphysema.  . Diverticulosis   . Dysrhythmia   . Essential hypertension   . GERD (gastroesophageal reflux disease)   . History of tobacco abuse    a. Quit 2014.  .Marland KitchenPancreatitis   . PSVT (paroxysmal supraventricular tachycardia) ([place]    a. 10/2012 Noted on Zio Patch.  . Tubular adenoma of colon   . Type II diabetes mellitus ([place]    Past Surgical History:  Procedure Laterality Date  . BYPASS GRAFT    . CARDIAC CATHETERIZATION N/A 07/12/2015   rocedure: Left Heart Cath and Cors/Grafts Angiography;  Surgeon: HBelva Crome MD;  [facility] [place];  Service: Cardiovascular;  Laterality: N/A;  . CARDIAC CATHETERIZATION Right 10/07/2015   Procedure: Left Heart Cath and Cors/Grafts Angiography;  Surgeon: SDionisio David MD;  [place] [facility];  Service: Cardiovascular;  Laterality: Right;  . CARDIAC CATHETERIZATION N/A 04/06/2016   Procedure: Left Heart Cath and Coronary Angiography;  Surgeon: DKarma Greaser  Prince Rome, MD;  Location: [place] CV LAB;  Service: Cardiovascular;  Laterality: N/A;  . CARDIAC CATHETERIZATION  04/06/2016   Procedure: Bypass Graft Angiography;  Surgeon: Yolonda Kida, MD;  Location: [place] CV LAB;  Service: Cardiovascular;;  . CARDIAC CATHETERIZATION N/A 11/02/2016   Procedure: Left Heart Cath and Cors/Grafts Angiography and possible PCI;  Surgeon: Yolonda Kida, MD;  Location: [place] CV LAB;  Service: Cardiovascular;  Laterality: N/A;  . CARDIAC CATHETERIZATION N/A 11/02/2016   Procedure: Coronary Stent Intervention;  Surgeon: Yolonda Kida, MD;  Location: [place] CV LAB;  Service: Cardiovascular;  Laterality: N/A;  . CHOLECYSTECTOMY    . COLONOSCOPY WITH PROPOFOL N/A 04/01/2018   Procedure: COLONOSCOPY WITH PROPOFOL;  Surgeon: Manya Silvas, MD;  Location: [facility] ENDOSCOPY;  Service: Endoscopy;  Laterality: N/A;  . ESOPHAGEAL DILATION    . ESOPHAGOGASTRODUODENOSCOPY (EGD) WITH PROPOFOL N/A 04/01/2018   Procedure: ESOPHAGOGASTRODUODENOSCOPY (EGD) WITH PROPOFOL;  Surgeon: Manya Silvas, MD;  Location: [facility] ENDOSCOPY;  Service: Endoscopy;  Laterality: N/A;  . TONSILLECTOMY    . VASCULAR SURGERY       IV Location/Drains/Wounds Patient Lines/Drains/Airways Status   Active Line/Drains/Airways    Name:   Placement date:   Placement time:   Site:   Days:   Peripheral IV 12/14/18 Left Antecubital   12/14/18    1950    Antecubital   less than 1          Intake/Output Last 24 hours No intake or output data in the 24 hours ending 12/14/18 2254  Labs/Imaging Results for orders placed or performed during the hospital encounter of 12/14/18 (from the past 48 hour(s))  Basic metabolic panel     Status: Abnormal   Collection Time: 12/14/18  6:01 PM  Result Value Ref Range   Sodium 136 135 - 145 mmol/L   Potassium 4.5 3.5 - 5.1 mmol/L   Chloride 99 98 - 111 mmol/L   CO2 31 22 - 32 mmol/L   Glucose, Bld 358 (H) 70 - 99 mg/dL   BUN 25 (H) 8 - 23 mg/dL   Creatinine, Ser 1.03 0.61 - 1.24 mg/dL   Calcium 9.4 8.9 - 10.3 mg/dL   GFR calc non Af Amer >60 >60 mL/min   GFR calc Af Amer >60 >60 mL/min   Anion gap 6 5 - 15    Comment: Performed at [facility], [address] [place], [place] [address]  CBC     Status: Abnormal   Collection Time: 12/14/18  6:01 PM  Result Value Ref Range   WBC 10.8 (H) 4.0 - 10.5 K/uL   RBC 5.53 4.22 - 5.81 MIL/uL   Hemoglobin 16.3 13.0 - 17.0 g/dL   HCT 50.6 39.0 - 52.0 %   MCV 91.5 80.0 - 100.0 fL   MCH 29.5 26.0 - 34.0 pg   MCHC 32.2 30.0 - 36.0 g/dL   RDW 13.6  11.5 - 15.5 %   Platelets 188 150 - 400 K/uL   nRBC 0.0 0.0 - 0.2 %    Comment: Performed at [facility], [address] [place], [place] [address]  Troponin I - ONCE - STAT     Status: None   Collection Time: 12/14/18  6:01 PM  Result Value Ref Range   Troponin I <0.03 <0.03 ng/mL    Comment: Performed at [facility], [address] [place], [place] [address]  Troponin I - ONCE - STAT  Status: None   Collection Time: 12/14/18  9:44 PM  Result Value Ref Range   Troponin I <0.03 <0.03 ng/mL    Comment: Performed at [facility], [address] [place],  [address]   Dg Chest 2 View  Result Date: 12/14/2018 CLINICAL DATA:  Chest pain. EXAM: CHEST - 2 VIEW COMPARISON:  Radiographs of December 11, 2018. FINDINGS: The heart size and mediastinal contours are within normal limits. No pneumothorax or pleural effusion is noted. Sternotomy wires are noted. Right lung is clear. Stable left basilar subsegmental atelectasis or scarring. The visualized skeletal structures are unremarkable. IMPRESSION: Stable left basilar subsegmental atelectasis or scarring. Electronically Signed   By: Marijo Conception, M.D.   On: 12/14/2018 18:25    Pending Labs Unresulted Labs (From admission, onward)    Start     Ordered   Signed and Held  Troponin I - Now Then Q3H  Now then every 3 hours,   TIMED     Signed and Held          Vitals/Pain Today's Vitals   12/14/18 1926 12/14/18 1930 12/14/18 1956 12/14/18 2000  BP: (!) 168/99   138/81  Pulse:  66  62  Resp:  15  17  TempSrc:      SpO2:  96%  94%  Weight:      Height:      PainSc:  9  5      Isolation Precautions No active isolations  Medications Medications  sodium chloride flush (NS) 0.9 % injection 3 mL (has no administration in time range)  insulin aspart (novoLOG) injection 0-9 Units (has no administration in time range)  morphine 4 MG/ML injection 4 mg (4 mg Intravenous Given 12/14/18 1955)   ondansetron (ZOFRAN) injection 4 mg (4 mg Intravenous Given 12/14/18 1955)  morphine 4 MG/ML injection 4 mg (4 mg Intravenous Given 12/14/18 2143)    Mobility independent Low fall risk   Focused Assessments Cardiac Assessment Handoff:  Cardiac Rhythm: Normal sinus rhythm Lab Results  Component Value Date   CKTOTAL 93 05/23/2014   CKMB 1.7 05/23/2014   TROPONINI <0.03 12/14/2018   No results found for: DDIMER Does the Patient currently have chest pain? Yes     Recommendations: See Admitting Provider Note  Report given to:   Additional Notes:  Morphine 22m at 2143

## 2018-12-15 ENCOUNTER — Encounter: Payer: Self-pay | Admitting: Radiology

## 2018-12-15 ENCOUNTER — Observation Stay: Payer: Medicare HMO

## 2018-12-15 DIAGNOSIS — R079 Chest pain, unspecified: Secondary | ICD-10-CM | POA: Diagnosis not present

## 2018-12-15 DIAGNOSIS — E119 Type 2 diabetes mellitus without complications: Secondary | ICD-10-CM | POA: Diagnosis not present

## 2018-12-15 DIAGNOSIS — I2510 Atherosclerotic heart disease of native coronary artery without angina pectoris: Secondary | ICD-10-CM | POA: Diagnosis not present

## 2018-12-15 DIAGNOSIS — I10 Essential (primary) hypertension: Secondary | ICD-10-CM | POA: Diagnosis not present

## 2018-12-15 DIAGNOSIS — R0789 Other chest pain: Secondary | ICD-10-CM | POA: Diagnosis not present

## 2018-12-15 LAB — GLUCOSE, CAPILLARY
Glucose-Capillary: 220 mg/dL — ABNORMAL HIGH (ref 70–99)
Glucose-Capillary: 244 mg/dL — ABNORMAL HIGH (ref 70–99)
Glucose-Capillary: 247 mg/dL — ABNORMAL HIGH (ref 70–99)
Glucose-Capillary: 241 mg/dL — ABNORMAL HIGH (ref 70–99)

## 2018-12-15 LAB — LIPASE, BLOOD: Lipase: 52 U/L — ABNORMAL HIGH (ref 11–51)

## 2018-12-15 LAB — TROPONIN I
Troponin I: <0.03 ng/mL (ref <0.03)
Troponin I: <0.03 ng/mL (ref <0.03)
Troponin I: <0.03 ng/mL (ref <0.03)

## 2018-12-15 MED ORDER — TECHNETIUM TC 99M TETROFOSMIN IV KIT
10.0000 | PACK | Freq: Once | INTRAVENOUS | Status: AC | PRN
  Administered 2018-12-15: 11.86 via INTRAVENOUS

## 2018-12-15 MED ORDER — OXYCODONE-ACETAMINOPHEN 5-325 MG PO TABS
1.0000 | ORAL_TABLET | ORAL | Status: DC | PRN
  Administered 2018-12-15 – 2018-12-16 (×4): 1 via ORAL
  Filled 2018-12-15 (×4): qty 1

## 2018-12-15 MED ORDER — TECHNETIUM TC 99M TETROFOSMIN IV KIT
32.4400 | PACK | Freq: Once | INTRAVENOUS | Status: AC | PRN
  Administered 2018-12-15: 32.44 via INTRAVENOUS

## 2018-12-15 MED ORDER — REGADENOSON 0.4 MG/5ML IV SOLN
0.4000 mg | Freq: Once | INTRAVENOUS | Status: AC
  Administered 2018-12-15: 0.4 mg via INTRAVENOUS
  Filled 2018-12-15: qty 5

## 2018-12-15 MED ORDER — OXYCODONE HCL 5 MG PO TABS
5.0000 mg | ORAL_TABLET | ORAL | Status: DC | PRN
  Administered 2018-12-15 – 2018-12-16 (×4): 5 mg via ORAL
  Filled 2018-12-15 (×4): qty 1

## 2018-12-15 MED ORDER — ISOSORBIDE MONONITRATE ER 60 MG PO TB24
120.0000 mg | ORAL_TABLET | Freq: Every day | ORAL | Status: DC
  Administered 2018-12-15 – 2018-12-16 (×2): 120 mg via ORAL
  Filled 2018-12-15 (×2): qty 2

## 2018-12-15 NOTE — Progress Notes (Signed)
Lexiscan Myoview performed this morning revealed normal left ventricular function with LVEF 63% with apical scar without significant ischemia.  No further cardiac diagnostics recommended at this time. Continue medical management. If the patient is stable, he may be discharged with close follow-up with Dr. [name] as outpatient.

## 2018-12-15 NOTE — Plan of Care (Signed)
Patient c/o pain throughout shift, PRN meds given. Pain has now moved from left chest/ upper shoulder to LUQ. Lipase level ordered.

## 2018-12-15 NOTE — Consult Note (Signed)
[facility] Cardiology  CARDIOLOGY CONSULT NOTE  Patient ID: Lawrence [name] MRN: [id number] DOB/AGE: 04/30/1956 63 y.o.  Admit date: 12/14/2018 Referring Physician [name], MD Primary Physician [name], MD Primary Cardiologist [name] Reason for Consultation Chest pain  HPI: 63 year old male referred for evaluation of chest pain. The patient has known coronary artery disease status post CABG x 2 in 2002, status post PCI with 4 stents, with most recent DES to SVG to [place] in 10/2016, status post recent cardiac catheterization in 05/2018, which revealed moderate diffuse native vessel disease with mild in-stent restenosis mid-LAD with patent vein graft to OM1 with continued medical management, hypertension, paroxysmal atrial fibrillation on Eliquis and Sotalol, chronic diastolic CHF, hyperlipidemia, OSA, type II diabetes, COPD on chronic oxygen supplementation with history of tobacco abuse, and chronic back pain on Percocet. The patient has a history of stable angina, and has been stable on isosorbide mononitrate 60 mg. He had previously tried Ranexa, but discontinued it due to lack of insurance coverage. The patient is currently being treated for bronchitis with antibiotic and prednisone. The patient reports experiencing chest pain yesterday while sitting. He reports that it felt like he was in atrial fibrillation with palpitations and a fluttering sensation with associated chest tightness. He took 324 mg of aspirin and 3 sublingual nitroglycerin without relief. He presented to [facility] ER for evaluation. Admission labs notable for troponin less than 0.03 x 4. ECG revealed sinus rhythm without acute ST-T wave abnormalities. Chest xray revealed stable left basilar subsegmental atelectasis versus scarring, without cardiomegaly or pleural effusion. 2D echocardiogram on 04/19/18 revealed normal left ventricular function with LVEF 50-55% with apical akinesis. Currently, the patient is undergoing a nuclear stress  test, and reports persistent chest pain while at rest that is only relieved with morphine. He denies known aggravating factors.   Review of systems complete and found to be negative unless listed above     Past Medical History:  Diagnosis Date  . A-fib ([place])   . Anemia   . Anginal pain ([place])   . Anxiety   . Arthritis   . Asthma   . CAD (coronary artery disease)    a. 2002 CABGx2 (LIMA->LAD, VG->VG->OM1);  b. 09/2012 DES->OM;  c. 03/2015 PTCA of LAD [facility]) in setting of atretic LIMA; d. 05/2015 Cath [facility]): nonobs dzs; e. 06/2015 Cath ([facility]): LM nl, LAD 45p/d ISR, 50d, D1/2 small, LCX 50p/d ISR, OM1 70ost, 30 ISR, VG->OM1 50ost, 33m LIMA->LAD 99p/d - atretic, RCA dom, nl; f.cath 10/16: 40-50%(FFR 0.90) pLAD, 75% (FFR 0.77) mLAD s/p PCI/DES, oRCA 40% (FFR0.95)  . Celiac disease   . Chronic diastolic CHF (congestive heart failure) ([place]    a. 06/2009 Echo: EF 60-65%, Gr 1 DD, triv AI, mildly dil LA, nl RV.  .Marland KitchenCOPD (chronic obstructive pulmonary disease) ([place]    a. Chronic bronchitis and emphysema.  . Diverticulosis   . Dysrhythmia   . Essential hypertension   . GERD (gastroesophageal reflux disease)   . History of tobacco abuse    a. Quit 2014.  .Marland KitchenPancreatitis   . PSVT (paroxysmal supraventricular tachycardia) ([place]    a. 10/2012 Noted on Zio Patch.  . Tubular adenoma of colon   . Type II diabetes mellitus ([place]     Past Surgical History:  Procedure Laterality Date  . BYPASS GRAFT    . CARDIAC CATHETERIZATION N/A 07/12/2015   rocedure: Left Heart Cath and Cors/Grafts Angiography;  Surgeon: HBelva Crome MD;  [place] [place];  Service: Cardiovascular;  Laterality: N/A;  . CARDIAC CATHETERIZATION Right 10/07/2015   Procedure: Left Heart Cath and Cors/Grafts Angiography;  Surgeon: Dionisio David, MD;  Location: [place] CV LAB;  Service: Cardiovascular;  Laterality: Right;  . CARDIAC CATHETERIZATION N/A 04/06/2016   Procedure: Left Heart Cath and Coronary Angiography;   Surgeon: Yolonda Kida, MD;  Location: [place] CV LAB;  Service: Cardiovascular;  Laterality: N/A;  . CARDIAC CATHETERIZATION  04/06/2016   Procedure: Bypass Graft Angiography;  Surgeon: Yolonda Kida, MD;  Location: [place] CV LAB;  Service: Cardiovascular;;  . CARDIAC CATHETERIZATION N/A 11/02/2016   Procedure: Left Heart Cath and Cors/Grafts Angiography and possible PCI;  Surgeon: Yolonda Kida, MD;  Location: [place] CV LAB;  Service: Cardiovascular;  Laterality: N/A;  . CARDIAC CATHETERIZATION N/A 11/02/2016   Procedure: Coronary Stent Intervention;  Surgeon: Yolonda Kida, MD;  Location: [place] CV LAB;  Service: Cardiovascular;  Laterality: N/A;  . CHOLECYSTECTOMY    . COLONOSCOPY WITH PROPOFOL N/A 04/01/2018   Procedure: COLONOSCOPY WITH PROPOFOL;  Surgeon: Manya Silvas, MD;  Location: [facility] ENDOSCOPY;  Service: Endoscopy;  Laterality: N/A;  . ESOPHAGEAL DILATION    . ESOPHAGOGASTRODUODENOSCOPY (EGD) WITH PROPOFOL N/A 04/01/2018   Procedure: ESOPHAGOGASTRODUODENOSCOPY (EGD) WITH PROPOFOL;  Surgeon: Manya Silvas, MD;  Location: [facility] ENDOSCOPY;  Service: Endoscopy;  Laterality: N/A;  . TONSILLECTOMY    . VASCULAR SURGERY      Medications Prior to Admission  Medication Sig Dispense Refill Last Dose  . allopurinol (ZYLOPRIM) 300 MG tablet Take 1 tablet (300 mg total) by mouth 2 (two) times daily. 180 tablet 4 12/14/2018 at Unknown time  . ALPRAZolam (XANAX) 1 MG tablet TAKE ONE-HALF TO ONE TABLET BY MOUTH THREE TIMES A DAY AS NEEDED (Patient taking differently: Take 1 mg by mouth 3 (three) times daily. ) 90 tablet 4 12/14/2018 at Unknown time  . aspirin 81 MG chewable tablet Chew 81 mg by mouth daily.   12/14/2018 at Unknown time  . atorvastatin (LIPITOR) 80 MG tablet TAKE ONE TABLET BY MOUTH AT BEDTIME. (Patient taking differently: Take 80 mg by mouth at bedtime. ) 90 tablet 4 12/13/2018 at Unknown time  . azithromycin (ZITHROMAX) 250 MG tablet  Take 1 tablet (250 mg total) by mouth daily. 4 each 0 12/14/2018 at Unknown time  . cetirizine (ZYRTEC) 10 MG tablet TAKE ONE TABLET AT BEDTIME (Patient taking differently: Take 10 mg by mouth at bedtime. ) 30 tablet 11 12/13/2018 at Unknown time  . cyclobenzaprine (FLEXERIL) 5 MG tablet TAKE 1 TO 2 TABLETS BY MOUTH THREE TIMESDAILY AS NEEDED (BACK PAIN) (Patient taking differently: Take 5-10 mg by mouth 3 (three) times daily as needed (back pain). ) 60 tablet 5 12/13/2018 at Unknown time  . dapagliflozin propanediol (FARXIGA) 5 MG TABS tablet Take 5 mg by mouth daily. 90 tablet 4 12/14/2018 at Unknown time  . docusate sodium (COLACE) 100 MG capsule Take 100 mg by mouth daily.    12/14/2018 at Unknown time  . ELIQUIS 5 MG TABS tablet TAKE 1 TABLET BY MOUTH TWICE A DAY. (Patient taking differently: Take 5 mg by mouth 2 (two) times daily. ) 60 tablet 12 12/14/2018 at 0700  . fluticasone furoate-vilanterol (BREO ELLIPTA) 100-25 MCG/INH AEPB Inhale 1 puff into the lungs daily. 1 each 2 12/14/2018 at Unknown time  . isosorbide mononitrate (IMDUR) 60 MG 24 hr tablet Take 1 tablet (60 mg total) by mouth daily. 60 tablet 1 12/14/2018 at  Unknown time  . ketoconazole (NIZORAL) 2 % cream Apply 1 application topically daily. 15 g 0 12/14/2018 at Unknown time  . LINZESS 72 MCG capsule Take 72 mcg by mouth daily.   12/14/2018 at Unknown time  . lisinopril (PRINIVIL,ZESTRIL) 2.5 MG tablet TAKE 1 TABLET BY MOUTH DAILY (Patient taking differently: Take 2.5 mg by mouth daily. ) 30 tablet 11 12/14/2018 at Unknown time  . magnesium oxide (MAG-OX) 400 (241.3 Mg) MG tablet TAKE 1 TABLET BY MOUTH DAILY (Patient taking differently: Take 400 mg by mouth daily. ) 30 tablet 5 12/14/2018 at Unknown time  . metFORMIN (GLUCOPHAGE) 500 MG tablet Take 1 tablet (500 mg total) by mouth 2 (two) times daily with a meal. 60 tablet 11 12/14/2018 at Unknown time  . omega-3 acid ethyl esters (LOVAZA) 1 g capsule TAKE FOUR CAPSULES BY MOUTH DAILY (Patient  taking differently: Take 4 g by mouth daily. ) 120 capsule 12 12/14/2018 at Unknown time  . omeprazole (PRILOSEC) 40 MG capsule Take 1 capsule by mouth 2 (two) times daily.   12/14/2018 at Unknown time  . oxyCODONE-acetaminophen (PERCOCET) 10-325 MG tablet Take 1 tablet by mouth every 4 (four) hours as needed. (Patient taking differently: Take 1 tablet by mouth every 4 (four) hours. ) 150 tablet 0 12/14/2018 at 1700  . predniSONE (DELTASONE) 10 MG tablet Take 1 tablet (10 mg total) by mouth daily. Day 1-3: take 4 tablets PO daily Day 4-6: take 3 tablets PO daily Day 7-9: take 2 tablets PO daily Day 10-12: take 1 tablet PO daily 30 tablet 0 12/14/2018 at Unknown time  . sotalol (BETAPACE) 80 MG tablet Take 80 mg by mouth 2 (two) times daily.    12/14/2018 at Unknown time  . SPIRIVA HANDIHALER 18 MCG inhalation capsule INHALE ONE CAPSULE AS DIRECTED ONCE A DAY (Patient taking differently: Place 18 mcg into inhaler and inhale daily. ) 30 capsule 12 12/14/2018 at Unknown time  . tamsulosin (FLOMAX) 0.4 MG CAPS capsule Take 0.4 mg by mouth daily.    12/14/2018 at Unknown time  . torsemide (DEMADEX) 100 MG tablet Take 0.5 tablets by mouth daily.   12/14/2018 at Unknown time  . albuterol (PROVENTIL HFA;VENTOLIN HFA) 108 (90 Base) MCG/ACT inhaler Inhale 2 puffs into the lungs every 4 (four) hours as needed for wheezing or shortness of breath. Reported on 04/08/2016 1 Inhaler 5 prn at prn  . albuterol (PROVENTIL) (2.5 MG/3ML) 0.083% nebulizer solution Take 3 mLs (2.5 mg total) by nebulization every 6 (six) hours as needed for wheezing or shortness of breath. 150 mL 1 prn at prn  . Blood Glucose Monitoring Suppl (ACCU-CHEK AVIVA PLUS) w/Device KIT CHECK BLOOD SUGAR EVERY DAY 1 kit 0 Taking  . glucose blood (ACCU-CHEK AVIVA PLUS) test strip Use to check blood sugar once a day 100 each 4 Taking  . nitroGLYCERIN (NITROSTAT) 0.4 MG SL tablet Place 1 tablet (0.4 mg total) under the tongue every 5 (five) minutes as needed for  chest pain. Reported on 05/26/2016 30 tablet 3 prn at prn  . OXYGEN Inhale 2 L into the lungs. Wears ATC and at night   Taking   Social History   Socioeconomic History  . Marital status: Married    Spouse name: Not on file  . Number of children: 1  . Years of education: Not on file  . Highest education level: 10th grade  Occupational History  . Occupation: Disabled  Social Needs  . Financial resource strain: Not hard at  all  . Food insecurity:    Worry: Never true    Inability: Never true  . Transportation needs:    Medical: No    Non-medical: No  Tobacco Use  . Smoking status: Former Smoker    Last attempt to quit: 04/22/2013    Years since quitting: 5.6  . Smokeless tobacco: Never Used  Substance and Sexual Activity  . Alcohol use: No    Comment: remotely quit alcohol use. Hx of heavy alcohol use.  . Drug use: No  . Sexual activity: Not on file  Lifestyle  . Physical activity:    Days per week: Not on file    Minutes per session: Not on file  . Stress: Only a little  Relationships  . Social connections:    Talks on phone: Not on file    Gets together: Not on file    Attends religious service: Not on file    Active member of club or organization: Not on file    Attends meetings of clubs or organizations: Not on file    Relationship status: Not on file  . Intimate partner violence:    Fear of current or ex partner: Not on file    Emotionally abused: Not on file    Physically abused: Not on file    Forced sexual activity: Not on file  Other Topics Concern  . Not on file  Social History Narrative   Pt lives in [place] with wife.  Does not routinely exercise.    Family History  Problem Relation Age of Onset  . Heart attack Mother   . Depression Mother   . Heart disease Mother   . COPD Mother   . Hypertension Mother   . Heart attack Father   . Diabetes Father   . Depression Father   . Heart disease Father   . Cirrhosis Father   . Parkinson's disease  Brother       Review of systems complete and found to be negative unless listed above      PHYSICAL EXAM  General: Well developed, well nourished, in obvious discomfort, pleasant, lying flat in bed HEENT:  Normocephalic and atramatic Neck:  No JVD.  Lungs: normal effort of breathing on supplemental oxygen via nasal cannula Heart: regular rate and rhythm, no murmur, rub or gallop Abdomen: nondistended Msk:  No obvious deformity Extremities: No clubbing, cyanosis or edema.   Neuro: Alert and oriented X 3. Psych:  Good affect, responds appropriately  Labs:   Lab Results  Component Value Date   WBC 10.8 (H) 12/14/2018   HGB 16.3 12/14/2018   HCT 50.6 12/14/2018   MCV 91.5 12/14/2018   PLT 188 12/14/2018    Recent Labs  Lab 12/14/18 1801  NA 136  K 4.5  CL 99  CO2 31  BUN 25*  CREATININE 1.03  CALCIUM 9.4  GLUCOSE 358*   Lab Results  Component Value Date   CKTOTAL 93 05/23/2014   CKMB 1.7 05/23/2014   TROPONINI <0.03 12/15/2018    Lab Results  Component Value Date   CHOL 125 04/19/2018   CHOL 151 01/10/2018   CHOL 157 10/31/2016   Lab Results  Component Value Date   HDL 25 (L) 04/19/2018   HDL 38 (L) 01/10/2018   HDL 30 (L) 10/31/2016   Lab Results  Component Value Date   LDLCALC 41 04/19/2018   LDLCALC 56 01/10/2018   LDLCALC UNABLE TO CALCULATE IF TRIGLYCERIDE OVER 400 mg/dL 10/31/2016  Lab Results  Component Value Date   TRIG 297 (H) 04/19/2018   TRIG 286 (H) 01/10/2018   TRIG 424 (H) 10/31/2016   Lab Results  Component Value Date   CHOLHDL 5.0 04/19/2018   CHOLHDL 4.0 01/10/2018   CHOLHDL 5.2 10/31/2016   No results found for: LDLDIRECT    Radiology: Dg Chest 2 View  Result Date: 12/14/2018 CLINICAL DATA:  Chest pain. EXAM: CHEST - 2 VIEW COMPARISON:  Radiographs of December 11, 2018. FINDINGS: The heart size and mediastinal contours are within normal limits. No pneumothorax or pleural effusion is noted. Sternotomy wires are noted.  Right lung is clear. Stable left basilar subsegmental atelectasis or scarring. The visualized skeletal structures are unremarkable. IMPRESSION: Stable left basilar subsegmental atelectasis or scarring. Electronically Signed   By: Marijo Conception, M.D.   On: 12/14/2018 18:25   Dg Chest 2 View  Result Date: 12/11/2018 CLINICAL DATA:  Midsternal chest tightness. Coughing and shortness of breath. EXAM: CHEST - 2 VIEW COMPARISON:  11/10/2018 FINDINGS: Postsurgical changes of CABG. Mildly enlarged cardiac silhouette. There is no evidence of focal airspace consolidation, pleural effusion or pneumothorax. Mild peribronchial cuffing with central predominance. Osseous structures are without acute abnormality. Soft tissues are grossly normal. IMPRESSION: Mild peribronchial cuffing with central predominance, usually associated with viral bronchitis or reactive airway disease. Electronically Signed   By: Fidela [place] M.D.   On: 12/11/2018 18:24    EKG: sinus rhthym  ASSESSMENT AND PLAN:  1. Chest pain with known coronary artery disease, with negative troponin x 5, ECG without evidence of acute ischemia. Chest pain only relieved with morphine. 2. Coronary artery disease, status post CABG x 2 in 2002, status post PCI with 4 stents, with most recent DES to SVG to [place] in 10/2016, status post recent cardiac catheterization in 05/2018, which revealed moderate diffuse native vessel disease with mild in-stent restenosis mid-LAD with patent vein graft to OM1 with continued medical management. The patient is on isosorbide mononitrate 60 mg. Insurance did not previously cover Ranexa. 3. Paroxysmal atrial fibrillation, currently in sinus rhythm, on sotalol for rhythm control, and Eliquis for stroke prevention. 4. COPD, on chronic supplemental oxygen 5. Chronic diastolic CHF, does not appear to be in acute exacerbation 6. OSA 7. Hypertension 8. Type II diabetes  Recommendations: 1. Continue Eliquis for stroke  prevention 2. Continue sotalol for rate and rhythm control 3. Review Lexiscan Myoview 4. Continue aspirin, atorvastatin 80 mg 5. Increase isosorbide mononitrate to 120 mg daily 6. If Myoview negative, patient may be discharged with close follow-up with Dr. [name] as outpatient  Signed: Clabe Seal PA-C 12/15/2018, 8:58 AM

## 2018-12-15 NOTE — Progress Notes (Signed)
[place] [facility] [place] NAME: Arya Boxley    MR#:  [id number]  DATE OF BIRTH:  1955-12-14  SUBJECTIVE:  CHIEF COMPLAINT:   Chief Complaint  Patient presents with  . Chest Pain  Patient seen and evaluated today has chest pain No shortness of breath No palpitations  REVIEW OF SYSTEMS:    ROS  CONSTITUTIONAL: No documented fever. No fatigue, weakness. No weight gain, no weight loss.  EYES: No blurry or double vision.  ENT: No tinnitus. No postnasal drip. No redness of the oropharynx.  RESPIRATORY: No cough, no wheeze, no hemoptysis. No dyspnea.  CARDIOVASCULAR: Has chest pain . No orthopnea. No palpitations. No syncope.  GASTROINTESTINAL: No nausea, no vomiting or diarrhea. No abdominal pain. No melena or hematochezia.  GENITOURINARY: No dysuria or hematuria.  ENDOCRINE: No polyuria or nocturia. No heat or cold intolerance.  HEMATOLOGY: No anemia. No bruising. No bleeding.  INTEGUMENTARY: No rashes. No lesions.  MUSCULOSKELETAL: No arthritis. No swelling. No gout.  NEUROLOGIC: No numbness, tingling, or ataxia. No seizure-type activity.  PSYCHIATRIC: No anxiety. No insomnia. No ADD.   DRUG ALLERGIES:   Allergies  Allergen Reactions  . Demerol [Meperidine] Hives  . Jardiance [Empagliflozin] Other (See Comments)    Perineal pain  . Prednisone Other (See Comments) and Hypertension    Pt states that this medication puts him in A-fib   . Sulfa Antibiotics Hives  . Albuterol Sulfate [Albuterol] Palpitations and Other (See Comments)    Pt currently uses this medication.    . Morphine Sulfate Nausea And Vomiting, Rash and Other (See Comments)    Pt states that he is only allergic to the tablet form of this medication.      VITALS:  Blood pressure (!) 143/89, pulse 68, temperature 97.6 F (36.4 C), temperature source Oral, resp. rate 16, height 5' 7"  (1.702 m), weight 112 kg, SpO2 95 %.  PHYSICAL EXAMINATION:   Physical  Exam  GENERAL:  63 y.o.-year-old patient lying in the bed with no acute distress.  EYES: Pupils equal, round, reactive to light and accommodation. No scleral icterus. Extraocular muscles intact.  HEENT: Head atraumatic, normocephalic. Oropharynx and nasopharynx clear.  NECK:  Supple, no jugular venous distention. No thyroid enlargement, no tenderness.  LUNGS: Normal breath sounds bilaterally, no wheezing, rales, rhonchi. No use of accessory muscles of respiration.  CARDIOVASCULAR: S1, S2 normal. No murmurs, rubs, or gallops.  ABDOMEN: Soft, nontender, nondistended. Bowel sounds present. No organomegaly or mass.  EXTREMITIES: No cyanosis, clubbing or edema b/l.    NEUROLOGIC: Cranial nerves II through XII are intact. No focal Motor or sensory deficits b/l.   PSYCHIATRIC: The patient is alert and oriented x 3.  SKIN: No obvious rash, lesion, or ulcer.   LABORATORY PANEL:   CBC Recent Labs  Lab 12/14/18 1801  WBC 10.8*  HGB 16.3  HCT 50.6  PLT 188   ------------------------------------------------------------------------------------------------------------------ Chemistries  Recent Labs  Lab 12/14/18 1801  NA 136  K 4.5  CL 99  CO2 31  GLUCOSE 358*  BUN 25*  CREATININE 1.03  CALCIUM 9.4   ------------------------------------------------------------------------------------------------------------------  Cardiac Enzymes Recent Labs  Lab 12/15/18 0518  TROPONINI <0.03   ------------------------------------------------------------------------------------------------------------------  RADIOLOGY:  Dg Chest 2 View  Result Date: 12/14/2018 CLINICAL DATA:  Chest pain. EXAM: CHEST - 2 VIEW COMPARISON:  Radiographs of December 11, 2018. FINDINGS: The heart size and mediastinal contours are within normal limits. No pneumothorax or pleural effusion is noted. Sternotomy wires  are noted. Right lung is clear. Stable left basilar subsegmental atelectasis or scarring. The visualized  skeletal structures are unremarkable. IMPRESSION: Stable left basilar subsegmental atelectasis or scarring. Electronically Signed   By: Marijo Conception, M.D.   On: 12/14/2018 18:25     ASSESSMENT AND PLAN:   63 year old male patient with history of coronary disease, CABG, atrial fibrillation, arthritis, diverticulosis, hypertension, GERD, paroxysmal supraventricular tachycardia, type 2 diabetes mellitus under hospitalist service with chest pain  -Unstable angina Serial troponins were negative Status post cardiology evaluation Cardiac stress test today Patient had a cath done in 2019 July  -Coronary artery disease Continue aspirin, statin and ACE inhibitor  -Type 2 diabetes mellitus Hold metformin Sliding scale coverage with insulin  -DVT prophylaxis Continue oral Eliquis  All the records are reviewed and case discussed with Care Management/Social Worker. Management plans discussed with the patient, family and they are in agreement.  CODE STATUS: Full code  DVT Prophylaxis: SCDs  TOTAL TIME TAKING CARE OF THIS PATIENT: 35 minutes.   POSSIBLE D/C IN 1 to 2 DAYS, DEPENDING ON CLINICAL CONDITION.  Saundra Shelling M.D on 12/15/2018 at 2:40 PM  Between 7am to 6pm - Pager - [phone]  After 6pm go to [email or web address] - password [email or web address] [facility]  [facility] [place] [facility]  Office  [phone]  CC: Primary care physician; Birdie Sons, MD  Note: This dictation was prepared with Dragon dictation along with smaller phrase technology. Any transcriptional errors that result from this process are unintentional.

## 2018-12-15 NOTE — Progress Notes (Signed)
Inpatient Diabetes Program Recommendations  AACE/ADA: New Consensus Statement on Inpatient Glycemic Control   Target Ranges:  Prepandial:   less than 140 mg/dL      Peak postprandial:   less than 180 mg/dL (1-2 hours)      Critically ill patients:  140 - 180 mg/dL   Results for Lawrence [name], Lawrence [name] (MRN [id number]) as of 12/15/2018 12:14  Ref. Range 12/15/2018 10:39 12/15/2018 11:48  Glucose-Capillary Latest Ref Range: 70 - 99 mg/dL 220 (H) 244 (H)  Results for DAKARI, CREGGER (MRN [id number]) as of 12/15/2018 12:14  Ref. Range 10/15/2018 00:00  Hemoglobin A1C Unknown 6.8   Review of Glycemic Control  Diabetes history: DM2 Outpatient Diabetes medications: Metformin 500 mg BID; noted Prednisone taper on home med list as well Current orders for Inpatient glycemic control: Metformin 500 mg BID, Novolog 0-9 units TID with meals  Inpatient Diabetes Program Recommendations:  Correction (SSI): Please consider increasing Novolog correction to moderate scale (Novolog 0-15 units TID with meals) and adding Novolog 0-5 units QHS for bedtime correction.  Thanks, Barnie Alderman, RN, MSN, CDE Diabetes Coordinator Inpatient Diabetes Program [phone] (Team Pager from 8am to 5pm)

## 2018-12-16 ENCOUNTER — Telehealth: Payer: Self-pay | Admitting: Family Medicine

## 2018-12-16 DIAGNOSIS — I10 Essential (primary) hypertension: Secondary | ICD-10-CM | POA: Diagnosis not present

## 2018-12-16 DIAGNOSIS — I2510 Atherosclerotic heart disease of native coronary artery without angina pectoris: Secondary | ICD-10-CM | POA: Diagnosis not present

## 2018-12-16 DIAGNOSIS — R079 Chest pain, unspecified: Secondary | ICD-10-CM | POA: Diagnosis not present

## 2018-12-16 DIAGNOSIS — E119 Type 2 diabetes mellitus without complications: Secondary | ICD-10-CM | POA: Diagnosis not present

## 2018-12-16 DIAGNOSIS — R0789 Other chest pain: Secondary | ICD-10-CM | POA: Diagnosis not present

## 2018-12-16 LAB — NM MYOCAR MULTI W/SPECT W/WALL MOTION / EF
Estimated workload: 1 METS
Exercise duration (min): 1 min
Exercise duration (sec): 0 s
LV dias vol: 80 mL (ref 62–150)
LV sys vol: 30 mL
MPHR: 156 {beats}/min
Peak HR: 92 {beats}/min
Percent HR: 58 %
Rest HR: 68 {beats}/min
SDS: 0
SRS: 7
SSS: 6
TID: 0.84

## 2018-12-16 LAB — GLUCOSE, CAPILLARY: Glucose-Capillary: 181 mg/dL — ABNORMAL HIGH (ref 70–99)

## 2018-12-16 MED ORDER — ISOSORBIDE MONONITRATE ER 120 MG PO TB24: 120.0000 mg | ORAL_TABLET | Freq: Every day | ORAL | 0 refills | Status: DC

## 2018-12-16 NOTE — Telephone Encounter (Signed)
Dr. [name], where can we work this patient in for a hospital follow up in 1 week? I don't see any 40 minute slots. Please review the schedule and let me know thanks.

## 2018-12-16 NOTE — Care Management Obs Status (Signed)
[place] NOTIFICATION   Patient Details  Name: ALTUS ZAINO MRN: [id number] Date of Birth: 1956-10-15   Medicare Observation Status Notification Given:  Yes    Elza Rafter, RN 12/16/2018, 10:34 AM

## 2018-12-16 NOTE — Telephone Encounter (Signed)
Pt advised.   Thanks,   -Laura  

## 2018-12-16 NOTE — Discharge Summary (Signed)
Lawrence [name] NAME: Lawrence [name]    MR#:  [id number]  DATE OF BIRTH:  1956/08/25  DATE OF ADMISSION:  12/14/2018 ADMITTING PHYSICIAN: Lawrence Mandes, MD  DATE OF DISCHARGE: 12/16/2018 11:55 AM  PRIMARY CARE PHYSICIAN: Lawrence Sons, MD   ADMISSION DIAGNOSIS:  Chest pain, unspecified type [R07.9]  DISCHARGE DIAGNOSIS:  Atypical chest pain probably secondary to gastritis Atrial fibrillation chronic Coronary artery disease Emphysema  SECONDARY DIAGNOSIS:   Past Medical History:  Diagnosis Date  . A-fib ([place])   . Anemia   . Anginal pain ([place])   . Anxiety   . Arthritis   . Asthma   . CAD (coronary artery disease)    a. 2002 CABGx2 (LIMA->LAD, VG->VG->OM1);  b. 09/2012 DES->OM;  c. 03/2015 PTCA of LAD [facility]) in setting of atretic LIMA; d. 05/2015 Cath [facility]): nonobs dzs; e. 06/2015 Cath ([facility]): LM nl, LAD 45p/d ISR, 50d, D1/2 small, LCX 50p/d ISR, OM1 70ost, 30 ISR, VG->OM1 50ost, 31m LIMA->LAD 99p/d - atretic, RCA dom, nl; f.cath 10/16: 40-50%(FFR 0.90) pLAD, 75% (FFR 0.77) mLAD s/p PCI/DES, oRCA 40% (FFR0.95)  . Celiac disease   . Chronic diastolic CHF (congestive heart failure) ([place]    a. 06/2009 Echo: EF 60-65%, Gr 1 DD, triv AI, mildly dil LA, nl RV.  .Marland KitchenCOPD (chronic obstructive pulmonary disease) ([place]    a. Chronic bronchitis and emphysema.  . Diverticulosis   . Dysrhythmia   . Essential hypertension   . GERD (gastroesophageal reflux disease)   . History of tobacco abuse    a. Quit 2014.  .Marland KitchenPancreatitis   . PSVT (paroxysmal supraventricular tachycardia) ([place]    a. 10/2012 Noted on [facility].  . Tubular adenoma of colon   . Type II diabetes mellitus ([place]      ADMITTING HISTORY Lawrence [name] is a 63y.o. male with a known history of CAD status post CABG, recent cath in July 2019 without major blockage, asthma, a fib on anticoagulation comes to the emergency room with chest pain mid substernal radiating to the left arm.  Patient states he is had pain since 3 to 4 days which has been constant without any relieving and aggravating factors.In the ER patient is hemodynamically stable EKG does not show acute ST elevation her depression. Cardiac enzymes is negative. When coronary artery disease and ongoing chest pain patient is being admitted for overnight observation. Recently was treated as outpatient for acute bronchitis. He has last dose of Zithromax and prednisone which he is still taking.  HOSPITAL COURSE:  Patient was admitted to telemetry floor.  Serial troponins were checked and were negative.  Cardiology consultation was done.  Was worked up with cardiac stress test which showed no ischemia, EF 63% normal left ventricular function.  Lipase level was borderline.  Patient chest pain resolved.  Patient will be discharged home on aspirin, nitrates, ACE inhibitor and statin medication and will continue Eliquis for anticoagulation.  CONSULTS OBTAINED:  Treatment Team:  Lawrence Cowman MD  DRUG ALLERGIES:   Allergies  Allergen Reactions  . Demerol [Meperidine] Hives  . Jardiance [Empagliflozin] Other (See Comments)    Perineal pain  . Prednisone Other (See Comments) and Hypertension    Pt states that this medication puts him in A-fib   . Sulfa Antibiotics Hives  . Albuterol Sulfate [Albuterol] Palpitations and Other (See Comments)    Pt currently uses this medication.    . Morphine Sulfate Nausea And Vomiting,  Rash and Other (See Comments)    Pt states that he is only allergic to the tablet form of this medication.      DISCHARGE MEDICATIONS:   Allergies as of 12/16/2018      Reactions   Demerol [meperidine] Hives   Jardiance [empagliflozin] Other (See Comments)   Perineal pain   Prednisone Other (See Comments), Hypertension   Pt states that this medication puts him in A-fib    Sulfa Antibiotics Hives   Albuterol Sulfate [albuterol] Palpitations, Other (See Comments)   Pt currently uses this  medication.     Morphine Sulfate Nausea And Vomiting, Rash, Other (See Comments)   Pt states that he is only allergic to the tablet form of this medication.        Medication List    STOP taking these medications   azithromycin 250 MG tablet Commonly known as:  ZITHROMAX   predniSONE 10 MG tablet Commonly known as:  DELTASONE     TAKE these medications   ACCU-CHEK AVIVA PLUS w/Device Kit CHECK BLOOD SUGAR EVERY DAY   albuterol 108 (90 Base) MCG/ACT inhaler Commonly known as:  PROVENTIL HFA;VENTOLIN HFA Inhale 2 puffs into the lungs every 4 (four) hours as needed for wheezing or shortness of breath. Reported on 04/08/2016   albuterol (2.5 MG/3ML) 0.083% nebulizer solution Commonly known as:  PROVENTIL Take 3 mLs (2.5 mg total) by nebulization every 6 (six) hours as needed for wheezing or shortness of breath.   allopurinol 300 MG tablet Commonly known as:  ZYLOPRIM Take 1 tablet (300 mg total) by mouth 2 (two) times daily.   ALPRAZolam 1 MG tablet Commonly known as:  XANAX TAKE ONE-HALF TO ONE TABLET BY MOUTH THREE TIMES A DAY AS NEEDED What changed:  See the new instructions.   aspirin 81 MG chewable tablet Chew 81 mg by mouth daily.   atorvastatin 80 MG tablet Commonly known as:  LIPITOR TAKE ONE TABLET BY MOUTH AT BEDTIME.   cetirizine 10 MG tablet Commonly known as:  ZYRTEC TAKE ONE TABLET AT BEDTIME   cyclobenzaprine 5 MG tablet Commonly known as:  FLEXERIL TAKE 1 TO 2 TABLETS BY MOUTH THREE TIMESDAILY AS NEEDED (BACK PAIN) What changed:  See the new instructions.   dapagliflozin propanediol 5 MG Tabs tablet Commonly known as:  FARXIGA Take 5 mg by mouth daily.   docusate sodium 100 MG capsule Commonly known as:  COLACE Take 100 mg by mouth daily.   ELIQUIS 5 MG Tabs tablet Generic drug:  apixaban TAKE 1 TABLET BY MOUTH TWICE A DAY. What changed:  how much to take   fluticasone furoate-vilanterol 100-25 MCG/INH Aepb Commonly known as:  BREO  ELLIPTA Inhale 1 puff into the lungs daily.   glucose blood test strip Commonly known as:  ACCU-CHEK AVIVA PLUS Use to check blood sugar once a day   isosorbide mononitrate 120 MG 24 hr tablet Commonly known as:  IMDUR Take 1 tablet (120 mg total) by mouth daily for 30 days. What changed:    medication strength  how much to take   ketoconazole 2 % cream Commonly known as:  NIZORAL Apply 1 application topically daily.   LINZESS 72 MCG capsule Generic drug:  linaclotide Take 72 mcg by mouth daily.   lisinopril 2.5 MG tablet Commonly known as:  PRINIVIL,ZESTRIL TAKE 1 TABLET BY MOUTH DAILY   magnesium oxide 400 (241.3 Mg) MG tablet Commonly known as:  MAG-OX TAKE 1 TABLET BY MOUTH DAILY   metFORMIN  500 MG tablet Commonly known as:  GLUCOPHAGE Take 1 tablet (500 mg total) by mouth 2 (two) times daily with a meal.   nitroGLYCERIN 0.4 MG SL tablet Commonly known as:  NITROSTAT Place 1 tablet (0.4 mg total) under the tongue every 5 (five) minutes as needed for chest pain. Reported on 05/26/2016   omega-3 acid ethyl esters 1 g capsule Commonly known as:  LOVAZA TAKE FOUR CAPSULES BY MOUTH DAILY What changed:  See the new instructions.   omeprazole 40 MG capsule Commonly known as:  PRILOSEC Take 1 capsule by mouth 2 (two) times daily.   oxyCODONE-acetaminophen 10-325 MG tablet Commonly known as:  PERCOCET Take 1 tablet by mouth every 4 (four) hours as needed. What changed:  when to take this   OXYGEN Inhale 2 L into the lungs. Wears ATC and at night   sotalol 80 MG tablet Commonly known as:  BETAPACE Take 80 mg by mouth 2 (two) times daily.   SPIRIVA HANDIHALER 18 MCG inhalation capsule Generic drug:  tiotropium INHALE ONE CAPSULE AS DIRECTED ONCE A DAY What changed:  See the new instructions.   tamsulosin 0.4 MG Caps capsule Commonly known as:  FLOMAX Take 0.4 mg by mouth daily.   torsemide 100 MG tablet Commonly known as:  DEMADEX Take 0.5 tablets by  mouth daily.       Today  Patient seen today No chest pain Tolerating diet well Hemodynamically stable and will be discharged home VITAL SIGNS:  Blood pressure 124/75, pulse 60, temperature 97.7 F (36.5 C), temperature source Oral, resp. rate 16, height 5' 7"  (1.702 m), weight 113.9 kg, SpO2 96 %.  I/O:    Intake/Output Summary (Last 24 hours) at 12/16/2018 1239 Last data filed at 12/16/2018 0958 Gross per 24 hour  Intake -  Output 1750 ml  Net -1750 ml    PHYSICAL EXAMINATION:  Physical Exam  GENERAL:  63 y.o.-year-old patient lying in the bed with no acute distress.  LUNGS: Normal breath sounds bilaterally, no wheezing, rales,rhonchi or crepitation. No use of accessory muscles of respiration.  CARDIOVASCULAR: S1, S2 normal. No murmurs, rubs, or gallops.  ABDOMEN: Soft, non-tender, non-distended. Bowel sounds present. No organomegaly or mass.  NEUROLOGIC: Moves all 4 extremities. PSYCHIATRIC: The patient is alert and oriented x 3.  SKIN: No obvious rash, lesion, or ulcer.   DATA REVIEW:   CBC Recent Labs  Lab 12/14/18 1801  WBC 10.8*  HGB 16.3  HCT 50.6  PLT 188    Chemistries  Recent Labs  Lab 12/14/18 1801  NA 136  K 4.5  CL 99  CO2 31  GLUCOSE 358*  BUN 25*  CREATININE 1.03  CALCIUM 9.4    Cardiac Enzymes Recent Labs  Lab 12/15/18 0518  TROPONINI <0.03    Microbiology Results  Results for orders placed or performed during the hospital encounter of 11/11/18  Culture, blood (routine x 2)     Status: None   Collection Time: 11/11/18  2:50 AM  Result Value Ref Range Status   Specimen Description BLOOD RIGHT ANTECUBITAL  Final   Special Requests   Final    BOTTLES DRAWN AEROBIC AND ANAEROBIC Blood Culture results may not be optimal due to an excessive volume of blood received in culture bottles   Culture   Final    NO GROWTH 5 DAYS Performed at [facility], [address] [place], [place] [address]    Report Status 11/16/2018  FINAL  Final  Culture, blood (  routine x 2)     Status: None   Collection Time: 11/11/18  2:51 AM  Result Value Ref Range Status   Specimen Description BLOOD LEFT ANTECUBITAL  Final   Special Requests   Final    BOTTLES DRAWN AEROBIC AND ANAEROBIC Blood Culture results may not be optimal due to an excessive volume of blood received in culture bottles   Culture   Final    NO GROWTH 5 DAYS Performed at [facility], [address], [place], [place] [address]    Report Status 11/16/2018 FINAL  Final    RADIOLOGY:  Dg Chest 2 View  Result Date: 12/14/2018 CLINICAL DATA:  Chest pain. EXAM: CHEST - 2 VIEW COMPARISON:  Radiographs of December 11, 2018. FINDINGS: The heart size and mediastinal contours are within normal limits. No pneumothorax or pleural effusion is noted. Sternotomy wires are noted. Right lung is clear. Stable left basilar subsegmental atelectasis or scarring. The visualized skeletal structures are unremarkable. IMPRESSION: Stable left basilar subsegmental atelectasis or scarring. Electronically Signed   By: Marijo Conception, M.D.   On: 12/14/2018 18:25    Follow up with PCP in 1 week.  Management plans discussed with the patient, family and they are in agreement.  CODE STATUS: Full code    Code Status Orders  (From admission, onward)         Start     Ordered   12/14/18 2324  Full code  Continuous     12/14/18 2323        Code Status History    Date Active Date Inactive Code Status Order ID Comments User Context   04/18/2018 1546 04/19/2018 1831 Full Code [id number]  Saundra Shelling, MD Inpatient   10/01/2017 0053 10/01/2017 1758 Full Code [id number]  Lance Coon, MD Inpatient   10/29/2016 2222 11/03/2016 1618 Full Code [id number]  Lance Coon, MD Inpatient   09/03/2016 0143 09/03/2016 1846 Full Code [id number]  Lance Coon, MD ED   06/25/2016 0036 06/26/2016 1535 Full Code [id number]  Lance Coon, MD ED   04/03/2016 2055 04/07/2016 1759 Full Code [id number]   Theodoro Grist, MD Inpatient   03/26/2016 2224 03/28/2016 1800 Full Code [id number]  Lance Coon, MD Inpatient   10/07/2015 0936 10/07/2015 1830 Full Code [id number]  Dionisio David, MD Inpatient   10/07/2015 0826 10/07/2015 0936 Full Code [id number]  Dionisio David, MD Inpatient   10/05/2015 1929 10/07/2015 0826 Full Code [id number]  Idelle Crouch, MD Inpatient   09/12/2015 2129 09/14/2015 1425 Full Code [id number]  Demetrios Loll, MD Inpatient   08/01/2015 0521 08/02/2015 1346 Full Code [id number]  Lance Coon, MD Inpatient   07/12/2015 1524 07/13/2015 2017 Full Code [id number]  Belva Crome, MD Inpatient   07/11/2015 2000 07/12/2015 1524 Full Code [id number]  Reola Mosher Inpatient   06/26/2015 0543 06/27/2015 1530 Full Code [id number]  Juluis Mire, MD Inpatient      TOTAL TIME TAKING CARE OF THIS PATIENT ON DAY OF DISCHARGE: more than 35 minutes.   Saundra Shelling M.D on 12/16/2018 at 12:39 PM  Between 7am to 6pm - Pager - [phone]  After 6pm go to [email or web address] - password [email or web address] [facility]  [facility]  Office  [phone]  CC: Primary care physician; Lawrence Sons, MD  Note: This dictation was prepared with Dragon dictation along with smaller phrase technology. Any transcriptional errors that result from this process are unintentional.

## 2018-12-16 NOTE — Telephone Encounter (Signed)
[facility]  Calling to make a Hospital f/u one week out from today.  Please call pt back with a time he can come in for a f/u.  Thanks, [employer]

## 2018-12-16 NOTE — Care Management Note (Signed)
Case Management Note  Patient Details  Name: Lawrence [name] MRN: [id number] Date of Birth: 1956-07-10  Subjective/Objective:     From home with wife.  Placed in observation with chest pain.  Discharging to home today.  Son will pick up and transport to home.  Denies difficulties obtaining medications or with accessing healthcare.  Independent in all ADL's.  Uses 2L oxygen at home.  Son is bringing portable O2 for discharge.  No further needs identified at this time.                  Action/Plan:   Expected Discharge Date:  12/16/18               Expected Discharge Plan:  Home/Self Care  In-House Referral:     Discharge planning Services     Post Acute Care Choice:    Choice offered to:     DME Arranged:    DME Agency:     HH Arranged:    HH Agency:     Status of Service:  Completed, signed off  If discussed at [employer] of Stay Meetings, dates discussed:    Additional Comments:  Elza Rafter, RN 12/16/2018, 10:38 AM

## 2018-12-16 NOTE — Progress Notes (Signed)
IV and tele removed from patient. Discharge instructions given to patient along with hard copy prescription. Verbalized understanding. No acute distress at this time. Son to bring patients home portable O2.

## 2018-12-16 NOTE — Telephone Encounter (Signed)
He can have the 11:20 slot on thursday

## 2018-12-18 DIAGNOSIS — J449 Chronic obstructive pulmonary disease, unspecified: Secondary | ICD-10-CM | POA: Diagnosis not present

## 2018-12-18 DIAGNOSIS — J42 Unspecified chronic bronchitis: Secondary | ICD-10-CM | POA: Diagnosis not present

## 2018-12-19 ENCOUNTER — Telehealth: Payer: Self-pay

## 2018-12-19 NOTE — Telephone Encounter (Signed)
Transition Care Management Follow-up Telephone Call  Date of discharge and from where: [facility] on 12/16/18  How have you been since you were released from the hospital? Doing well, no chest pain, fever or n/v/d. Does have SOB due to COPD. Has started coughing and has a yellow-green sputum and is congested.   Any questions or concerns? No   Items Reviewed:  Did the pt receive and understand the discharge instructions provided? Yes   Medications obtained and verified? Decline reviewing at this time. Confirmed Isosorbide change. Will review at [place] [address].  Any new allergies since your discharge? No   Dietary orders reviewed? Yes  Do you have support at home? Yes   Other (ie: DME, Home Health, etc) N/A  Functional Questionnaire: (I = Independent and D = Dependent)  Bathing/Dressing- I   Meal Prep- I  Eating- I  Maintaining continence- I  Transferring/Ambulation- I  Managing Meds- I   Follow up appointments reviewed:    PCP Hospital f/u appt confirmed? Yes  Scheduled to see Dr. [name] on 12/22/18 @ 11:20 AM.  [facility] f/u appt confirmed? Not scheduled yet, plans to set this up this week.   Are transportation arrangements needed? No   If their condition worsens, is the pt aware to call  their PCP or go to the ED? Yes  Was the patient provided with contact information for the PCP's office or ED? Yes  Was the pt encouraged to call back with questions or concerns? Yes

## 2018-12-22 ENCOUNTER — Ambulatory Visit: Payer: Medicare HMO | Admitting: Family Medicine

## 2018-12-26 ENCOUNTER — Ambulatory Visit: Payer: Medicare HMO | Admitting: Urology

## 2018-12-29 ENCOUNTER — Other Ambulatory Visit: Payer: Self-pay

## 2018-12-29 DIAGNOSIS — M5410 Radiculopathy, site unspecified: Secondary | ICD-10-CM

## 2018-12-29 MED ORDER — OXYCODONE-ACETAMINOPHEN 10-325 MG PO TABS: 1.0000 | ORAL_TABLET | ORAL | 0 refills | Status: DC | PRN

## 2019-01-12 DIAGNOSIS — G4710 Hypersomnia, unspecified: Secondary | ICD-10-CM | POA: Diagnosis not present

## 2019-01-12 DIAGNOSIS — J449 Chronic obstructive pulmonary disease, unspecified: Secondary | ICD-10-CM | POA: Diagnosis not present

## 2019-01-12 DIAGNOSIS — J439 Emphysema, unspecified: Secondary | ICD-10-CM | POA: Diagnosis not present

## 2019-01-12 DIAGNOSIS — I10 Essential (primary) hypertension: Secondary | ICD-10-CM | POA: Diagnosis not present

## 2019-01-16 ENCOUNTER — Ambulatory Visit: Payer: Medicare HMO | Admitting: Urology

## 2019-01-16 DIAGNOSIS — J449 Chronic obstructive pulmonary disease, unspecified: Secondary | ICD-10-CM | POA: Diagnosis not present

## 2019-01-16 DIAGNOSIS — J42 Unspecified chronic bronchitis: Secondary | ICD-10-CM | POA: Diagnosis not present

## 2019-01-19 ENCOUNTER — Other Ambulatory Visit: Payer: Self-pay | Admitting: Family Medicine

## 2019-01-19 ENCOUNTER — Other Ambulatory Visit: Payer: Self-pay

## 2019-01-19 MED ORDER — FLUTICASONE FUROATE-VILANTEROL 100-25 MCG/INH IN AEPB: 1.0000 | INHALATION_SPRAY | Freq: Every day | RESPIRATORY_TRACT | 2 refills | Status: DC

## 2019-01-19 NOTE — Telephone Encounter (Signed)
Spoke with pt advised he need follow up he said he will call us he had some family thing going on

## 2019-01-25 ENCOUNTER — Other Ambulatory Visit: Payer: Self-pay | Admitting: Family Medicine

## 2019-01-25 DIAGNOSIS — M5410 Radiculopathy, site unspecified: Secondary | ICD-10-CM

## 2019-01-25 MED ORDER — OXYCODONE-ACETAMINOPHEN 10-325 MG PO TABS: 1.0000 | ORAL_TABLET | ORAL | 0 refills | Status: DC | PRN

## 2019-01-25 NOTE — Telephone Encounter (Signed)
Pt needs a refill on   Oxycodone 10-325  [facility]  Thanks  teri

## 2019-02-03 ENCOUNTER — Ambulatory Visit: Payer: Medicare HMO

## 2019-02-08 ENCOUNTER — Telehealth: Payer: Self-pay

## 2019-02-08 NOTE — Telephone Encounter (Signed)
Called pt to schedule telephonic AWV and appointment was made for 02/14/19 @ 9:20 AM.  Pt also requested that I inform PCP that the Cyclobenzaprine is not covered by his insurance. Pt received a letter stating that he would not be able to get another refill on this and he needs to change to one of the preferred medications or request a PA. Pt states he has been taking this for awhile, for back pain. No substitutions were listed in the letter. Please advise, thanks. -MM

## 2019-02-10 DIAGNOSIS — J449 Chronic obstructive pulmonary disease, unspecified: Secondary | ICD-10-CM | POA: Diagnosis not present

## 2019-02-10 DIAGNOSIS — G4710 Hypersomnia, unspecified: Secondary | ICD-10-CM | POA: Diagnosis not present

## 2019-02-10 DIAGNOSIS — I10 Essential (primary) hypertension: Secondary | ICD-10-CM | POA: Diagnosis not present

## 2019-02-10 DIAGNOSIS — J439 Emphysema, unspecified: Secondary | ICD-10-CM | POA: Diagnosis not present

## 2019-02-13 NOTE — Telephone Encounter (Signed)
Please call pharmacy and have then send prior auth information

## 2019-02-13 NOTE — Telephone Encounter (Signed)
Spoke with pharmacist at [facility] who states that claim had went through for Cyclobenzaprine and did not need prior authorization. I advised pharmacist to fill medication and to notify patient when available for pick up. KW

## 2019-02-14 ENCOUNTER — Ambulatory Visit: Payer: Medicare HMO

## 2019-02-15 ENCOUNTER — Ambulatory Visit: Payer: Medicare HMO | Admitting: Family

## 2019-02-16 DIAGNOSIS — J42 Unspecified chronic bronchitis: Secondary | ICD-10-CM | POA: Diagnosis not present

## 2019-02-16 DIAGNOSIS — J449 Chronic obstructive pulmonary disease, unspecified: Secondary | ICD-10-CM | POA: Diagnosis not present

## 2019-02-20 ENCOUNTER — Other Ambulatory Visit: Payer: Self-pay | Admitting: Family Medicine

## 2019-02-20 NOTE — Telephone Encounter (Signed)
Please Review

## 2019-02-23 ENCOUNTER — Other Ambulatory Visit: Payer: Self-pay | Admitting: Family Medicine

## 2019-02-23 DIAGNOSIS — M5410 Radiculopathy, site unspecified: Secondary | ICD-10-CM

## 2019-02-23 DIAGNOSIS — F419 Anxiety disorder, unspecified: Secondary | ICD-10-CM

## 2019-02-23 MED ORDER — OXYCODONE-ACETAMINOPHEN 10-325 MG PO TABS: 1.0000 | ORAL_TABLET | ORAL | 0 refills | Status: DC | PRN

## 2019-02-23 NOTE — Telephone Encounter (Signed)
Please Review

## 2019-03-06 ENCOUNTER — Ambulatory Visit: Payer: Medicare HMO | Admitting: Family Medicine

## 2019-03-13 DIAGNOSIS — G4710 Hypersomnia, unspecified: Secondary | ICD-10-CM | POA: Diagnosis not present

## 2019-03-13 DIAGNOSIS — I10 Essential (primary) hypertension: Secondary | ICD-10-CM | POA: Diagnosis not present

## 2019-03-13 DIAGNOSIS — J439 Emphysema, unspecified: Secondary | ICD-10-CM | POA: Diagnosis not present

## 2019-03-13 DIAGNOSIS — J449 Chronic obstructive pulmonary disease, unspecified: Secondary | ICD-10-CM | POA: Diagnosis not present

## 2019-03-15 ENCOUNTER — Telehealth: Payer: Self-pay

## 2019-03-15 IMAGING — CT CT ABD-PELV W/ CM
2 of 5 series · 15 of 46 positions shown, 17 images · IV contrast (iopamidol)
Comparison: Multiple prior exams most recent CT 06/24/2016

CLINICAL DATA: Upper abdominal pain.

EXAM:
CT ABDOMEN AND PELVIS WITH CONTRAST
TECHNIQUE: Multidetector CT imaging of the abdomen and pelvis was performed
using the standard protocol following bolus administration of
intravenous contrast.
CONTRAST:  100mL [id number] IOPAMIDOL ([id number]) INJECTION 61%

[Series 2: routine abd/pel with · axial · 0.80mm/px · z∈[-802,-352]mm · 12 of 102 slices shown, 14 images]
[im 6/102  soft-tissue]
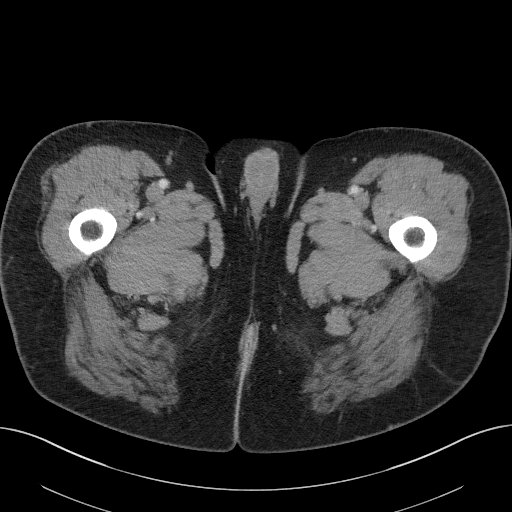
[im 6/102  bone]
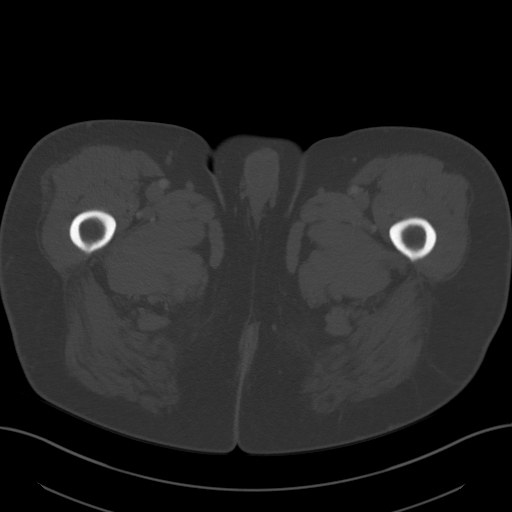
[im 16/102  soft-tissue]
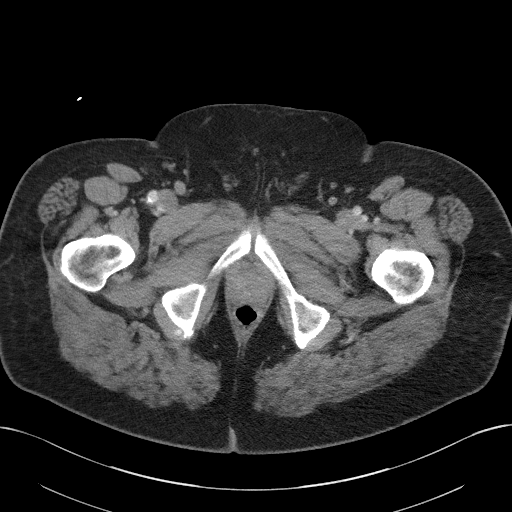
[im 21/102  soft-tissue]
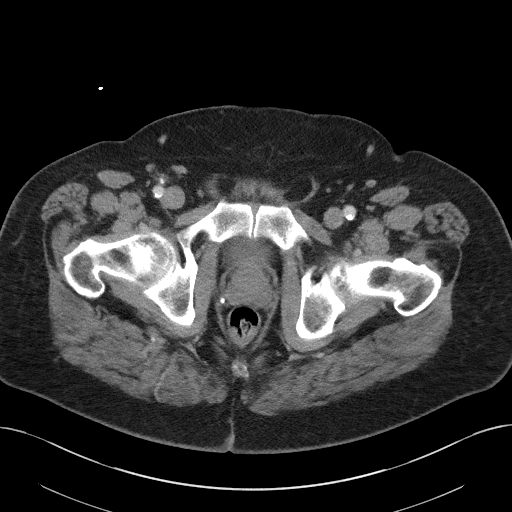
[im 31/102  soft-tissue]
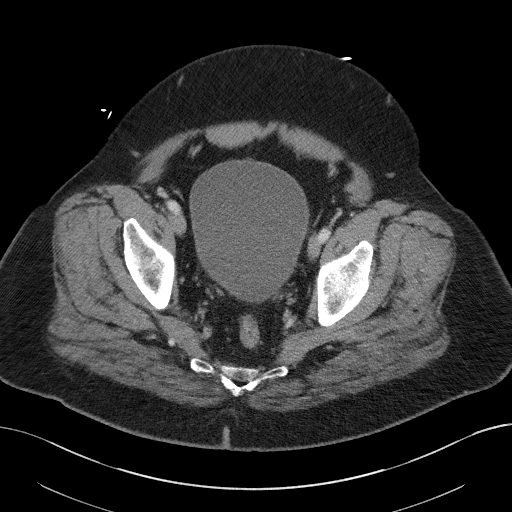
[im 41/102  soft-tissue]
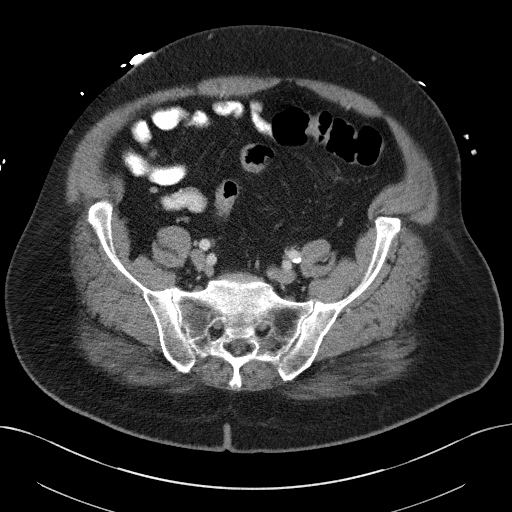
[im 46/102  soft-tissue]
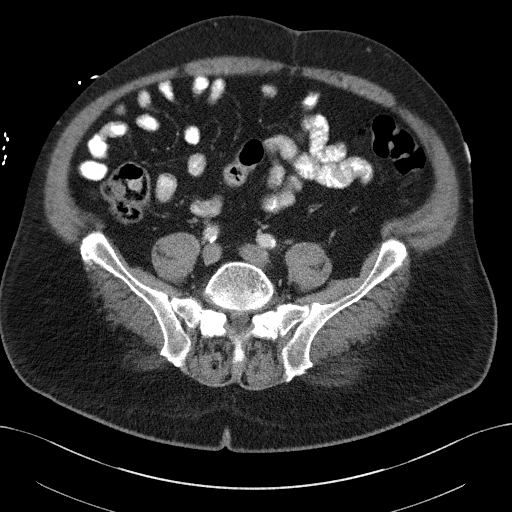
[im 56/102  soft-tissue]
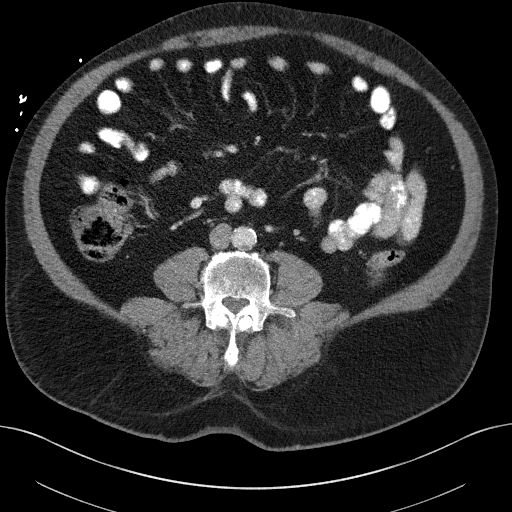
[im 61/102  soft-tissue]
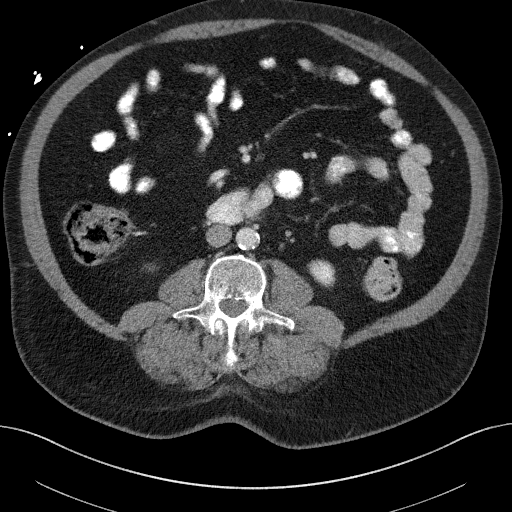
[im 71/102  soft-tissue]
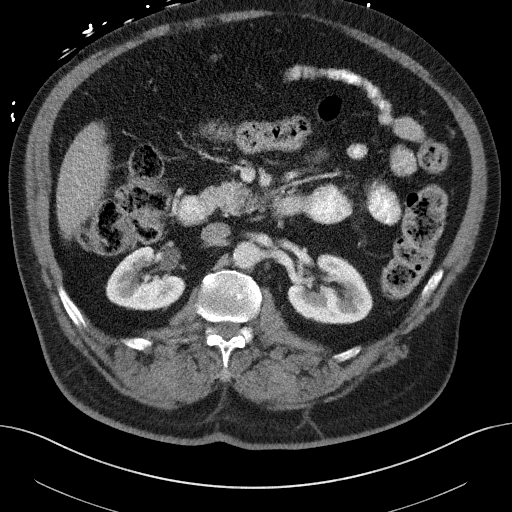
[im 71/102  bone]
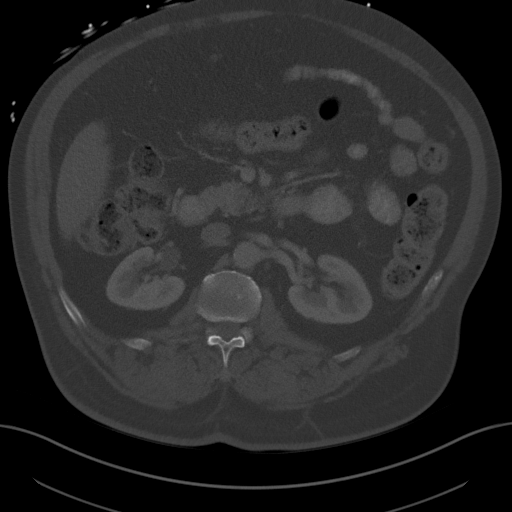
[im 81/102  soft-tissue]
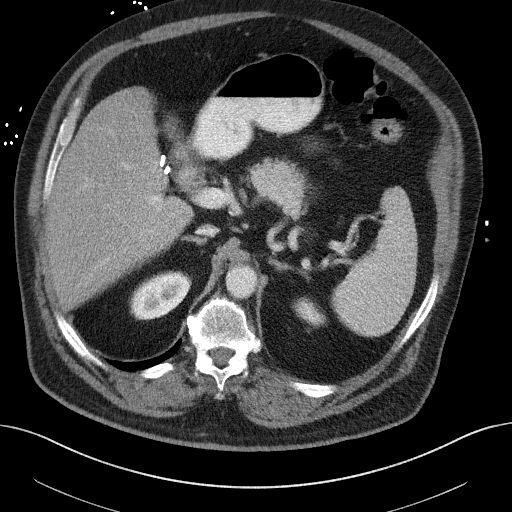
[im 86/102  soft-tissue]
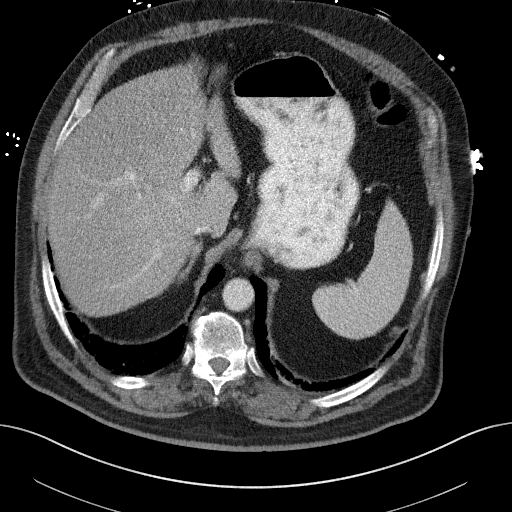
[im 96/102  soft-tissue]
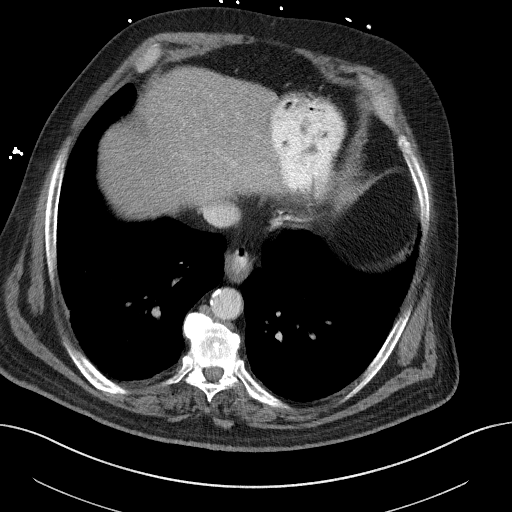

[Series 5: coronal st · coronal · 0.78mm/px · 3 of 119 slices shown]
[im 40/119  soft-tissue]
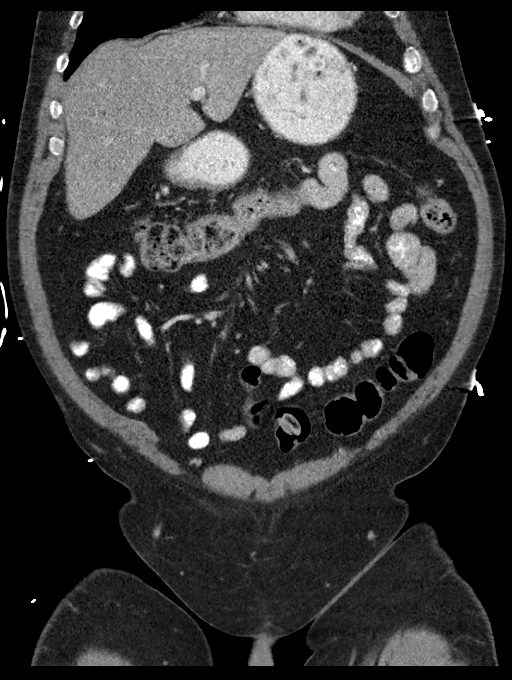
[im 53/119  soft-tissue]
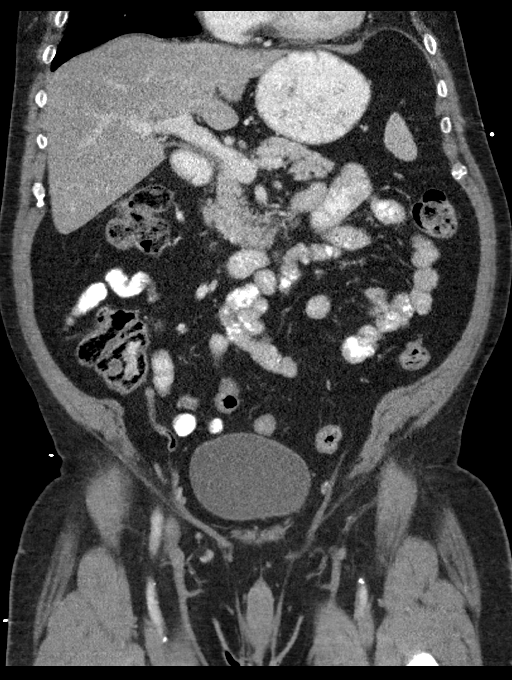
[im 66/119  soft-tissue]
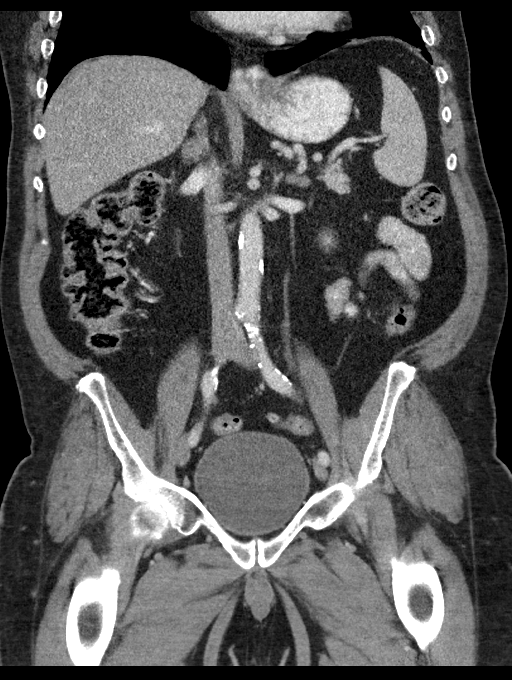

[15 of 46 positions shown; findings below may reference images not displayed]

FINDINGS: Lower chest: Chronic basilar atelectasis or scarring, left greater
than right. No consolidation. No pleural fluid.

Hepatobiliary: Decreased hepatic density consistent with steatosis.
No focal lesion. Clips in the gallbladder fossa postcholecystectomy.
No biliary dilatation.

Pancreas: No ductal dilatation or inflammation.

Spleen: Normal in size without focal abnormality.

Adrenals/Urinary Tract: 13 mm right adrenal nodule is stable. 14 mm
left adrenal nodule is stable. No hydronephrosis or perinephric
edema. Homogeneous renal enhancement is symmetric excretion on
delayed phase imaging. Urinary bladder is physiologically distended
without wall thickening. No evidence of urolithiasis.

Stomach/Bowel: Mild distal esophageal thickening and enteric
contrast in the distal esophagus, small hiatal hernia. Stomach
physiologically distended with enteric contrast. Small duodenal
diverticulum again seen. No bowel obstruction or inflammation,
enteric contrast reaches cecum. Moderate stool burden throughout the
colon. Minimal diverticulosis of the sigmoid colon without
diverticulitis. Normal appendix.

Vascular/Lymphatic: Aortic and branch atherosclerosis without
aneurysm. No adenopathy.

Reproductive: Prostate is unchanged in size.

Other: Tiny fat containing umbilical hernia. Minimal fat the left
inguinal canal. No free air, free fluid, or intra-abdominal fluid
collection.

Musculoskeletal: There are no acute or suspicious osseous
abnormalities.
IMPRESSION: 1. Mild distal esophageal wall thickening and small hiatal hernia
with enteric contrast in the distal esophagus, suggesting reflux or
delayed transit.
2. No additional acute abnormality in the abdomen/pelvis.
3. Chronic findings of hepatic steatosis and sigmoid colonic
diverticulosis.
4. Small fat containing umbilical and left inguinal hernias.
5. Aortic atherosclerosis without aneurysm.

## 2019-03-15 NOTE — Telephone Encounter (Signed)
   TELEPHONE CALL NOTE  This patient has been deemed a candidate for follow-up tele-health visit to limit community exposure during the Covid-19 pandemic. I spoke with the patient via phone to discuss instructions. The patient was advised to review the section on consent for treatment as well. The patient will receive a phone call 2-3 days prior to their E-Visit at which time consent will be verbally confirmed. A Virtual Office Visit appointment type has been scheduled for 03/17/2019 with Darylene Price FNP.  Gaylord Shih, CMA 03/15/2019 11:39 AM

## 2019-03-15 NOTE — Telephone Encounter (Signed)
TELEPHONE CALL NOTE  Lawrence [name] has been deemed a candidate for a follow-up tele-health visit to limit community exposure during the Covid-19 pandemic. I spoke with the patient via phone to ensure availability of phone/video source, confirm preferred email & phone number, discuss instructions and expectations, and review consent.   I reminded Lawrence [name] to be prepared with any vital sign and/or heart rhythm information that could potentially be obtained via home monitoring, at the time of his visit.  Finally, I reminded Lawrence [name] to expect an e-mail containing a link for their video-based visit approximately 15 minutes before his visit, or alternatively, a phone call at the time of his visit if his visit is planned to be a phone encounter.  Did the patient verbally consent to treatment as below? YES  Veleda Mun L, CMA 03/15/2019 11:40 AM  CONSENT FOR TELE-HEALTH VISIT - PLEASE REVIEW  I hereby voluntarily request, consent and authorize [facility] and its employed or contracted physicians, physician assistants, nurse practitioners or other licensed health care professionals (the Practitioner), to provide me with telemedicine health care services (the "Services") as deemed necessary by the treating Practitioner. I acknowledge and consent to receive the Services by the Practitioner via telemedicine. I understand that the telemedicine visit will involve communicating with the Practitioner through telephonic communication technology and the disclosure of certain medical information by electronic transmission. I acknowledge that I have been given the opportunity to request an in-person assessment or other available alternative prior to the telemedicine visit and am voluntarily participating in the telemedicine visit.  I understand that I have the right to withhold or withdraw my consent to the use of telemedicine in the course of my care at any time, without affecting my  right to future care or treatment, and that the Practitioner or I may terminate the telemedicine visit at any time. I understand that I have the right to inspect all information obtained and/or recorded in the course of the telemedicine visit and may receive copies of available information for a reasonable fee.  I understand that some of the potential risks of receiving the Services via telemedicine include:  Marland Kitchen Delay or interruption in medical evaluation due to technological equipment failure or disruption; . Information transmitted may not be sufficient (e.g. poor resolution of images) to allow for appropriate medical decision making by the Practitioner; and/or  . In rare instances, security protocols could fail, causing a breach of personal health information.  Furthermore, I acknowledge that it is my responsibility to provide information about my medical history, conditions and care that is complete and accurate to the best of my ability. I acknowledge that Practitioner's advice, recommendations, and/or decision may be based on factors not within their control, such as incomplete or inaccurate data provided by me or lack of visual representation. I understand that the practice of medicine is not an exact science and that Practitioner makes no warranties or guarantees regarding treatment outcomes. I acknowledge that I will receive a copy of this consent concurrently upon execution via email to the email address I last provided but may also request a printed copy by calling the office of [facility].    I understand that my insurance may be billed for this visit.   I have read or had this consent read to me. . I understand the contents of this consent, which adequately explains the benefits and risks of the Services being provided via telemedicine.  Marland Kitchen  I have been provided ample opportunity to ask questions regarding this consent and the Services and have had my questions answered to my  satisfaction. . I give my informed consent for the services to be provided through the use of telemedicine in my medical care  By participating in this telemedicine visit I agree to the above.

## 2019-03-17 ENCOUNTER — Ambulatory Visit: Payer: Medicare HMO | Attending: Family | Admitting: Family

## 2019-03-17 ENCOUNTER — Other Ambulatory Visit: Payer: Self-pay

## 2019-03-17 ENCOUNTER — Encounter: Payer: Self-pay | Admitting: Family

## 2019-03-17 DIAGNOSIS — I10 Essential (primary) hypertension: Secondary | ICD-10-CM

## 2019-03-17 DIAGNOSIS — E119 Type 2 diabetes mellitus without complications: Secondary | ICD-10-CM

## 2019-03-17 DIAGNOSIS — J410 Simple chronic bronchitis: Secondary | ICD-10-CM

## 2019-03-17 DIAGNOSIS — I5032 Chronic diastolic (congestive) heart failure: Secondary | ICD-10-CM

## 2019-03-17 NOTE — Progress Notes (Signed)
Virtual Visit via Telephone Note   Evaluation Performed:  Follow-up visit  This visit type was conducted due to national recommendations for restrictions regarding the COVID-19 Pandemic (e.g. social distancing).  This format is felt to be most appropriate for this patient at this time.  All issues noted in this document were discussed and addressed.  No physical exam was performed (except for noted visual exam findings with Video Visits).  Please refer to the patient's chart (MyChart message for video visits and phone note for telephone visits) for the patient's consent to telehealth for Lawrence [name]  Date:  03/17/2019   ID:  Lawrence [name], DOB [birth year], MRN [id number]  Patient Location:  [place] [address]   Provider location:   [name] [place] [address], [place] [address]  PCP:  Birdie Sons, MD  Cardiologist:  [name], MD Electrophysiologist:  None   Chief Complaint:  Shortness of breath  History of Present Illness:    Lawrence [name] is a 63 y.o. male who presents via audio/video conferencing for a telehealth visit today.  Patient verified DOB and address.  The patient does not have symptoms concerning for COVID-19 infection (fever, chills, cough, or new SHORTNESS OF BREATH).   He describes a minimal amount of shortness of breath upon exertion. He says that this has been present for several years. He has associated cough, difficulty sleeping, fatigue and anxiety due to stress with his wife's illness. He denies any dizziness, pedal edema, abdominal distention, palpitations, chest pain, rhinorrhea or weight gain although he's not consistently weighing himself. His wife was being treated for breast cancer and then recently had a stroke and is currently receiving therapy. Taking his diuretic as needed.   Prior CV studies:   The following studies were reviewed today:  Echo report from 04/19/18 reviewed and  showed an EF of 50-55%  Past Medical History:  Diagnosis Date  . A-fib ([place])   . Anemia   . Anginal pain ([place])   . Anxiety   . Arthritis   . Asthma   . CAD (coronary artery disease)    a. 2002 CABGx2 (LIMA->LAD, VG->VG->OM1);  b. 09/2012 DES->OM;  c. 03/2015 PTCA of LAD [facility]) in setting of atretic LIMA; d. 05/2015 Cath [facility]): nonobs dzs; e. 06/2015 Cath ([facility]): LM nl, LAD 45p/d ISR, 50d, D1/2 small, LCX 50p/d ISR, OM1 70ost, 30 ISR, VG->OM1 50ost, 30m LIMA->LAD 99p/d - atretic, RCA dom, nl; f.cath 10/16: 40-50%(FFR 0.90) pLAD, 75% (FFR 0.77) mLAD s/p PCI/DES, oRCA 40% (FFR0.95)  . Celiac disease   . Chronic diastolic CHF (congestive heart failure) ([place]    a. 06/2009 Echo: EF 60-65%, Gr 1 DD, triv AI, mildly dil LA, nl RV.  .Marland KitchenCOPD (chronic obstructive pulmonary disease) ([place]    a. Chronic bronchitis and emphysema.  . Diverticulosis   . Dysrhythmia   . Essential hypertension   . GERD (gastroesophageal reflux disease)   . History of tobacco abuse    a. Quit 2014.  .Marland KitchenPancreatitis   . PSVT (paroxysmal supraventricular tachycardia) ([place]    a. 10/2012 Noted on [facility].  . Tubular adenoma of colon   . Type II diabetes mellitus ([place]    Past Surgical History:  Procedure Laterality Date  . BYPASS GRAFT    . CARDIAC CATHETERIZATION N/A 07/12/2015   rocedure: Left Heart Cath and Cors/Grafts Angiography;  Surgeon: HBelva Crome MD;  [facility];  Service: Cardiovascular;  Laterality: N/A;  . CARDIAC CATHETERIZATION Right 10/07/2015   Procedure: Left Heart Cath and Cors/Grafts Angiography;  Surgeon: Dionisio David, MD;  Location: [place] CV LAB;  Service: Cardiovascular;  Laterality: Right;  . CARDIAC CATHETERIZATION N/A 04/06/2016   Procedure: Left Heart Cath and Coronary Angiography;  Surgeon: Yolonda Kida, MD;  Location: [place] CV LAB;  Service: Cardiovascular;  Laterality: N/A;  . CARDIAC CATHETERIZATION  04/06/2016   Procedure: Bypass Graft  Angiography;  Surgeon: Yolonda Kida, MD;  Location: [place] CV LAB;  Service: Cardiovascular;;  . CARDIAC CATHETERIZATION N/A 11/02/2016   Procedure: Left Heart Cath and Cors/Grafts Angiography and possible PCI;  Surgeon: Yolonda Kida, MD;  Location: [place] CV LAB;  Service: Cardiovascular;  Laterality: N/A;  . CARDIAC CATHETERIZATION N/A 11/02/2016   Procedure: Coronary Stent Intervention;  Surgeon: Yolonda Kida, MD;  Location: [place] CV LAB;  Service: Cardiovascular;  Laterality: N/A;  . CHOLECYSTECTOMY    . COLONOSCOPY WITH PROPOFOL N/A 04/01/2018   Procedure: COLONOSCOPY WITH PROPOFOL;  Surgeon: Manya Silvas, MD;  Location: [facility] ENDOSCOPY;  Service: Endoscopy;  Laterality: N/A;  . ESOPHAGEAL DILATION    . ESOPHAGOGASTRODUODENOSCOPY (EGD) WITH PROPOFOL N/A 04/01/2018   Procedure: ESOPHAGOGASTRODUODENOSCOPY (EGD) WITH PROPOFOL;  Surgeon: Manya Silvas, MD;  Location: [facility] ENDOSCOPY;  Service: Endoscopy;  Laterality: N/A;  . TONSILLECTOMY    . VASCULAR SURGERY       Current Meds  Medication Sig  . albuterol (PROVENTIL HFA;VENTOLIN HFA) 108 (90 Base) MCG/ACT inhaler Inhale 2 puffs into the lungs every 4 (four) hours as needed for wheezing or shortness of breath. Reported on 04/08/2016  . albuterol (PROVENTIL) (2.5 MG/3ML) 0.083% nebulizer solution Take 3 mLs (2.5 mg total) by nebulization every 6 (six) hours as needed for wheezing or shortness of breath.  . allopurinol (ZYLOPRIM) 300 MG tablet Take 1 tablet (300 mg total) by mouth 2 (two) times daily.  Marland Kitchen ALPRAZolam (XANAX) 1 MG tablet TAKE ONE-HALF TO ONE TABLET BY MOUTH THREE TIMES A DAY AS NEEDED  . aspirin 81 MG chewable tablet Chew 81 mg by mouth daily.  Marland Kitchen atorvastatin (LIPITOR) 80 MG tablet TAKE ONE TABLET BY MOUTH AT BEDTIME. (Patient taking differently: Take 80 mg by mouth at bedtime. )  . Blood Glucose Monitoring Suppl (ACCU-CHEK AVIVA PLUS) w/Device KIT CHECK BLOOD SUGAR EVERY DAY  .  cetirizine (ZYRTEC) 10 MG tablet TAKE ONE TABLET AT BEDTIME (Patient taking differently: Take 10 mg by mouth at bedtime. )  . cyclobenzaprine (FLEXERIL) 5 MG tablet TAKE 1 TO 2 TABLETS BY MOUTH THREE TIMESDAILY AS NEEDED (BACK PAIN) (Patient taking differently: Take 5-10 mg by mouth 3 (three) times daily as needed (back pain). )  . dapagliflozin propanediol (FARXIGA) 5 MG TABS tablet Take 5 mg by mouth daily.  Marland Kitchen docusate sodium (COLACE) 100 MG capsule Take 100 mg by mouth daily.   Marland Kitchen ELIQUIS 5 MG TABS tablet TAKE 1 TABLET BY MOUTH TWICE A DAY. (Patient taking differently: Take 5 mg by mouth 2 (two) times daily. )  . fluticasone furoate-vilanterol (BREO ELLIPTA) 100-25 MCG/INH AEPB Inhale 1 puff into the lungs daily.  Marland Kitchen glucose blood (ACCU-CHEK AVIVA PLUS) test strip Use to check blood sugar once a day  . isosorbide mononitrate (IMDUR) 120 MG 24 hr tablet TAKE 1 TABLET BY MOUTH DAILY  . ketoconazole (NIZORAL) 2 % cream Apply 1 application topically daily.  Marland Kitchen LINZESS 72 MCG capsule  Take 72 mcg by mouth daily.  Marland Kitchen lisinopril (PRINIVIL,ZESTRIL) 2.5 MG tablet TAKE 1 TABLET BY MOUTH DAILY (Patient taking differently: Take 2.5 mg by mouth daily. )  . magnesium oxide (MAG-OX) 400 (241.3 Mg) MG tablet TAKE 1 TABLET BY MOUTH DAILY (Patient taking differently: Take 400 mg by mouth daily. )  . metFORMIN (GLUCOPHAGE) 500 MG tablet Take 1 tablet (500 mg total) by mouth 2 (two) times daily with a meal.  . nitroGLYCERIN (NITROSTAT) 0.4 MG SL tablet Place 1 tablet (0.4 mg total) under the tongue every 5 (five) minutes as needed for chest pain. Reported on 05/26/2016  . omega-3 acid ethyl esters (LOVAZA) 1 g capsule TAKE FOUR CAPSULES BY MOUTH DAILY (Patient taking differently: Take 4 g by mouth daily. )  . omeprazole (PRILOSEC) 40 MG capsule Take 1 capsule by mouth 2 (two) times daily.  Marland Kitchen oxyCODONE-acetaminophen (PERCOCET) 10-325 MG tablet Take 1 tablet by mouth every 4 (four) hours as needed.  . OXYGEN Inhale 2 L into  the lungs. Wears ATC and at night  . sotalol (BETAPACE) 80 MG tablet Take 80 mg by mouth 2 (two) times daily.   . tamsulosin (FLOMAX) 0.4 MG CAPS capsule Take 0.4 mg by mouth daily.   Marland Kitchen tiotropium (SPIRIVA HANDIHALER) 18 MCG inhalation capsule Place 1 capsule (18 mcg total) into inhaler and inhale daily.  Marland Kitchen torsemide (DEMADEX) 100 MG tablet Take 0.5 tablets by mouth daily.     Allergies:   Demerol [meperidine]; Jardiance [empagliflozin]; Prednisone; Sulfa antibiotics; Albuterol sulfate [albuterol]; and Morphine sulfate   Social History   Tobacco Use  . Smoking status: Former Smoker    Last attempt to quit: 04/22/2013    Years since quitting: 5.9  . Smokeless tobacco: Never Used  Substance Use Topics  . Alcohol use: No    Comment: remotely quit alcohol use. Hx of heavy alcohol use.  . Drug use: No     Family Hx: The patient's family history includes COPD in his mother; Cirrhosis in his father; Depression in his father and mother; Diabetes in his father; Heart attack in his father and mother; Heart disease in his father and mother; Hypertension in his mother; Parkinson's disease in his brother.  ROS:   Please see the history of present illness.     All other systems reviewed and are negative.   Labs/Other Tests and Data Reviewed:    Recent Labs: 04/30/2018: B Natriuretic Peptide 28.0 11/11/2018: ALT 28 12/14/2018: BUN 25; Creatinine, Ser 1.03; Hemoglobin 16.3; Platelets 188; Potassium 4.5; Sodium 136   Recent Lipid Panel Lab Results  Component Value Date/Time   CHOL 125 04/19/2018 03:29 AM   CHOL 151 01/10/2018 10:41 AM   CHOL 92 05/23/2014 04:15 AM   TRIG 297 (H) 04/19/2018 03:29 AM   TRIG 109 05/23/2014 04:15 AM   HDL 25 (L) 04/19/2018 03:29 AM   HDL 38 (L) 01/10/2018 10:41 AM   HDL 28 (L) 05/23/2014 04:15 AM   CHOLHDL 5.0 04/19/2018 03:29 AM   LDLCALC 41 04/19/2018 03:29 AM   LDLCALC 56 01/10/2018 10:41 AM   LDLCALC 42 05/23/2014 04:15 AM    Wt Readings from Last 3  Encounters:  12/16/18 251 lb 1.7 oz (113.9 kg)  12/11/18 248 lb (112.5 kg)  11/28/18 247 lb (112 kg)     Exam:    Vital Signs:  There were no vitals taken for this visit.   Well nourished, well developed male in no  acute distress.   ASSESSMENT &  PLAN:    1. Chronic heart failure with preserved ejection fraction- - NYHA class II - euvolemic based on patient's description of symptoms - not weighing daily because he says that he's too worried about his wife; encouraged him to take care of himself and to weigh daily so that he can call for an overnight weight gain of >2 pounds or a weekly weight gain of >5 pounds.  - not adding salt and is rinsing canned foods - saw cardiologist [name]) 11/04/18 - BNP 04/30/18 was 28.0  2: HTN- - not checking his BP at home - saw PCP [name]) 11/28/2018 - BMP 12/14/2018 reviewed and showed sodium 136, potassium 4.5, creatinine 1.03  and GFR >60  3: COPD- - wearing oxygen at 2L at bedtime - saw pulmonologist Humphrey Rolls) 06/20/18  4: Diabetes-  - fasting glucose at home this morning was 120   COVID-19 Education: The signs and symptoms of COVID-19 were discussed with the patient and how to seek care for testing (follow up with PCP or arrange E-visit).  The importance of social distancing was discussed today.  Patient Risk:   After full review of this patients clinical status, I feel that they are at least moderate risk at this time.  Time:   Today, I have spent 12 minutes with the patient with telehealth technology discussing medications, daily weight and symptoms to report.     Medication Adjustments/Labs and Tests Ordered: Current medicines are reviewed at length with the patient today.  Concerns regarding medicines are outlined above.   Tests Ordered: No orders of the defined types were placed in this encounter.  Medication Changes: No orders of the defined types were placed in this encounter.   Disposition:  Follow-up in 2 months or  sooner for any questions/problems before then.   Signed, Alisa Graff, FNP  03/17/2019 11:28 AM    [name]

## 2019-03-17 NOTE — Patient Instructions (Signed)
Resume weighing daily and call for an overnight weight gain of > 2 pounds or a weekly weight gain of >5 pounds. 

## 2019-03-18 DIAGNOSIS — J42 Unspecified chronic bronchitis: Secondary | ICD-10-CM | POA: Diagnosis not present

## 2019-03-18 DIAGNOSIS — J449 Chronic obstructive pulmonary disease, unspecified: Secondary | ICD-10-CM | POA: Diagnosis not present

## 2019-03-21 ENCOUNTER — Other Ambulatory Visit: Payer: Self-pay

## 2019-03-21 ENCOUNTER — Encounter: Payer: Self-pay | Admitting: Family Medicine

## 2019-03-21 ENCOUNTER — Ambulatory Visit (INDEPENDENT_AMBULATORY_CARE_PROVIDER_SITE_OTHER): Payer: Medicare HMO | Admitting: Family Medicine

## 2019-03-21 VITALS — BP 160/93 | HR 67 | Temp 98.2°F | Wt 246.0 lb

## 2019-03-21 DIAGNOSIS — F439 Reaction to severe stress, unspecified: Secondary | ICD-10-CM | POA: Diagnosis not present

## 2019-03-21 DIAGNOSIS — G4700 Insomnia, unspecified: Secondary | ICD-10-CM

## 2019-03-21 MED ORDER — VENLAFAXINE HCL ER 37.5 MG PO CP24: 37.5000 mg | ORAL_CAPSULE | Freq: Every day | ORAL | 1 refills | Status: DC

## 2019-03-21 MED ORDER — TRAZODONE HCL 100 MG PO TABS: 100.0000 mg | ORAL_TABLET | Freq: Every day | ORAL | 1 refills | Status: DC

## 2019-03-21 MED ORDER — TIZANIDINE HCL 4 MG PO TABS: 4.0000 mg | ORAL_TABLET | Freq: Four times a day (QID) | ORAL | 0 refills | Status: DC | PRN

## 2019-03-21 NOTE — Progress Notes (Signed)
Patient: Lawrence [name] Male    DOB: [birth year]   63 y.o.   MRN: [id number] Visit Date: 03/21/2019  Today's Provider: [name], MD   Chief Complaint  Patient presents with  . Anxiety   Subjective:     Anxiety  Presents for initial visit. Onset was 1 to 6 months ago. The problem has been gradually worsening. Symptoms include excessive worry, insomnia, malaise, nervous/anxious behavior and restlessness. Patient reports no confusion, decreased concentration, depressed mood, panic or suicidal ideas. The symptoms are aggravated by family issues (Pt's wife was diagnosed with cancer.). The quality of sleep is poor.      Allergies  Allergen Reactions  . Demerol [Meperidine] Hives  . Jardiance [Empagliflozin] Other (See Comments)    Perineal pain  . Prednisone Other (See Comments) and Hypertension    Pt states that this medication puts him in A-fib   . Sulfa Antibiotics Hives  . Albuterol Sulfate [Albuterol] Palpitations and Other (See Comments)    Pt currently uses this medication.    . Morphine Sulfate Nausea And Vomiting, Rash and Other (See Comments)    Pt states that he is only allergic to the tablet form of this medication.       Current Outpatient Medications:  .  albuterol (PROVENTIL HFA;VENTOLIN HFA) 108 (90 Base) MCG/ACT inhaler, Inhale 2 puffs into the lungs every 4 (four) hours as needed for wheezing or shortness of breath. Reported on 04/08/2016, Disp: 1 Inhaler, Rfl: 5 .  albuterol (PROVENTIL) (2.5 MG/3ML) 0.083% nebulizer solution, Take 3 mLs (2.5 mg total) by nebulization every 6 (six) hours as needed for wheezing or shortness of breath., Disp: 150 mL, Rfl: 1 .  allopurinol (ZYLOPRIM) 300 MG tablet, Take 1 tablet (300 mg total) by mouth 2 (two) times daily., Disp: 180 tablet, Rfl: 4 .  ALPRAZolam (XANAX) 1 MG tablet, TAKE ONE-HALF TO ONE TABLET BY MOUTH THREE TIMES A DAY AS NEEDED, Disp: 90 tablet, Rfl: 3 .  aspirin 81 MG chewable tablet, Chew 81 mg by mouth  daily., Disp: , Rfl:  .  atorvastatin (LIPITOR) 80 MG tablet, TAKE ONE TABLET BY MOUTH AT BEDTIME. (Patient taking differently: Take 80 mg by mouth at bedtime. ), Disp: 90 tablet, Rfl: 4 .  Blood Glucose Monitoring Suppl (ACCU-CHEK AVIVA PLUS) w/Device KIT, CHECK BLOOD SUGAR EVERY DAY, Disp: 1 kit, Rfl: 0 .  cetirizine (ZYRTEC) 10 MG tablet, TAKE ONE TABLET AT BEDTIME (Patient taking differently: Take 10 mg by mouth at bedtime. ), Disp: 30 tablet, Rfl: 11 .  cyclobenzaprine (FLEXERIL) 5 MG tablet, TAKE 1 TO 2 TABLETS BY MOUTH THREE TIMESDAILY AS NEEDED (BACK PAIN) (Patient taking differently: Take 5-10 mg by mouth 3 (three) times daily as needed (back pain). ), Disp: 60 tablet, Rfl: 5 .  dapagliflozin propanediol (FARXIGA) 5 MG TABS tablet, Take 5 mg by mouth daily., Disp: 90 tablet, Rfl: 4 .  docusate sodium (COLACE) 100 MG capsule, Take 100 mg by mouth daily. , Disp: , Rfl:  .  ELIQUIS 5 MG TABS tablet, TAKE 1 TABLET BY MOUTH TWICE A DAY. (Patient taking differently: Take 5 mg by mouth 2 (two) times daily. ), Disp: 60 tablet, Rfl: 12 .  fluticasone furoate-vilanterol (BREO ELLIPTA) 100-25 MCG/INH AEPB, Inhale 1 puff into the lungs daily., Disp: 1 each, Rfl: 2 .  glucose blood (ACCU-CHEK AVIVA PLUS) test strip, Use to check blood sugar once a day, Disp: 100 each, Rfl: 4 .  isosorbide mononitrate (IMDUR)  120 MG 24 hr tablet, TAKE 1 TABLET BY MOUTH DAILY, Disp: 30 tablet, Rfl: 5 .  ketoconazole (NIZORAL) 2 % cream, Apply 1 application topically daily., Disp: 15 g, Rfl: 0 .  LINZESS 72 MCG capsule, Take 72 mcg by mouth daily., Disp: , Rfl:  .  lisinopril (PRINIVIL,ZESTRIL) 2.5 MG tablet, TAKE 1 TABLET BY MOUTH DAILY (Patient taking differently: Take 2.5 mg by mouth daily. ), Disp: 30 tablet, Rfl: 11 .  magnesium oxide (MAG-OX) 400 (241.3 Mg) MG tablet, TAKE 1 TABLET BY MOUTH DAILY (Patient taking differently: Take 400 mg by mouth daily. ), Disp: 30 tablet, Rfl: 5 .  metFORMIN (GLUCOPHAGE) 500 MG tablet,  Take 1 tablet (500 mg total) by mouth 2 (two) times daily with a meal., Disp: 60 tablet, Rfl: 11 .  nitroGLYCERIN (NITROSTAT) 0.4 MG SL tablet, Place 1 tablet (0.4 mg total) under the tongue every 5 (five) minutes as needed for chest pain. Reported on 05/26/2016, Disp: 30 tablet, Rfl: 3 .  omega-3 acid ethyl esters (LOVAZA) 1 g capsule, TAKE FOUR CAPSULES BY MOUTH DAILY (Patient taking differently: Take 4 g by mouth daily. ), Disp: 120 capsule, Rfl: 12 .  omeprazole (PRILOSEC) 40 MG capsule, Take 1 capsule by mouth 2 (two) times daily., Disp: , Rfl:  .  oxyCODONE-acetaminophen (PERCOCET) 10-325 MG tablet, Take 1 tablet by mouth every 4 (four) hours as needed., Disp: 150 tablet, Rfl: 0 .  OXYGEN, Inhale 2 L into the lungs. Wears ATC and at night, Disp: , Rfl:  .  sotalol (BETAPACE) 80 MG tablet, Take 80 mg by mouth 2 (two) times daily. , Disp: , Rfl:  .  tamsulosin (FLOMAX) 0.4 MG CAPS capsule, Take 0.4 mg by mouth daily. , Disp: , Rfl:  .  tiotropium (SPIRIVA HANDIHALER) 18 MCG inhalation capsule, Place 1 capsule (18 mcg total) into inhaler and inhale daily., Disp: 30 capsule, Rfl: 12 .  torsemide (DEMADEX) 100 MG tablet, Take 0.5 tablets by mouth as needed. , Disp: , Rfl:   Review of Systems  Constitutional: Negative.   Psychiatric/Behavioral: Positive for sleep disturbance. Negative for agitation, behavioral problems, confusion, decreased concentration, dysphoric mood, hallucinations, self-injury and suicidal ideas. The patient is nervous/anxious and has insomnia. The patient is not hyperactive.     Social History   Tobacco Use  . Smoking status: Former Smoker    Last attempt to quit: 04/22/2013    Years since quitting: 5.9  . Smokeless tobacco: Never Used  Substance Use Topics  . Alcohol use: No    Comment: remotely quit alcohol use. Hx of heavy alcohol use.      Objective:   BP (!) 160/93 (BP Location: Left Arm, Patient Position: Sitting, Cuff Size: Large)   Pulse 67   Temp 98.2 F  (36.8 C) (Oral)   Wt 246 lb (111.6 kg)   BMI 38.53 kg/m  Vitals:   03/21/19 1457  BP: (!) 160/93  Pulse: 67  Temp: 98.2 F (36.8 C)  TempSrc: Oral  Weight: 246 lb (111.6 kg)     Physical Exam  General appearance: alert, well developed, well nourished, cooperative and in no distress Head: Normocephalic, without obvious abnormality, atraumatic Respiratory: Respirations even and unlabored, normal respiratory rate Extremities: No gross deformities Skin: Skin color, texture, turgor normal. No rashes seen  Psych: Appropriate mood and affect. Neurologic: Mental status: Alert, oriented to person, place, and time, thought content appropriate.     Assessment & Plan    1. Insomnia, unspecified type start-  traZODone (DESYREL) 100 MG tablet; Take 1 tablet (100 mg total) by mouth at bedtime. For sleep  Dispense: 30 tablet; Refill: 1  2. Situational stress  - venlafaxine XR (EFFEXOR XR) 37.5 MG 24 hr capsule; Take 1 capsule (37.5 mg total) by mouth daily with breakfast.  Dispense: 30 capsule; Refill: 1  He is to increase to 2 caps daily after a week.   He states his insurance won't cover cyclobenzaprine. He was given a printed prescription for tizanidine to take in its place, but is going to check on out of pocket ose of cyclobenzaprine first.   Future Appointments  Date Time Provider [place]  04/17/2019  4:00 PM Jillian Warth, [name], MD [facility] None       [name], MD  [place]

## 2019-03-21 NOTE — Patient Instructions (Addendum)
.   Please review the attached list of medications and notify my office if there are any errors.   . Start Effexor (vanlafaxine) by taking one capsule every morning for the first week, then increase to 2 capsules daily   You can either take tizanidine or cyclobenzaprine for you muscle relaxer whichever less expensive, but the tizanidine may cause drowsiness.

## 2019-03-23 ENCOUNTER — Telehealth: Payer: Self-pay

## 2019-03-23 DIAGNOSIS — M5410 Radiculopathy, site unspecified: Secondary | ICD-10-CM

## 2019-03-23 MED ORDER — OXYCODONE-ACETAMINOPHEN 10-325 MG PO TABS: 1.0000 | ORAL_TABLET | ORAL | 0 refills | Status: DC | PRN

## 2019-03-23 NOTE — Telephone Encounter (Signed)
Patient is requesting a refill on oxyCODONE-acetaminophen (PERCOCET) 10-325 MG tablet be sent to [facility]

## 2019-04-04 ENCOUNTER — Telehealth: Payer: Self-pay | Admitting: Family Medicine

## 2019-04-04 DIAGNOSIS — F439 Reaction to severe stress, unspecified: Secondary | ICD-10-CM

## 2019-04-04 MED ORDER — VENLAFAXINE HCL ER 75 MG PO CP24: 75.0000 mg | ORAL_CAPSULE | Freq: Every day | ORAL | 3 refills | Status: DC

## 2019-04-04 NOTE — Telephone Encounter (Signed)
Pt wanted to know if Venlafaxine 37.5 mg times two tablets starting May.  He will need a refill for 75 mg a day.  He said this is doing good for him.  [facility]  CB#  [phone]  Thanks Con Memos

## 2019-04-06 ENCOUNTER — Other Ambulatory Visit: Payer: Self-pay

## 2019-04-06 MED ORDER — TIZANIDINE HCL 4 MG PO TABS: 4.0000 mg | ORAL_TABLET | Freq: Four times a day (QID) | ORAL | 1 refills | Status: DC | PRN

## 2019-04-06 NOTE — Telephone Encounter (Signed)
Patient request refill

## 2019-04-07 ENCOUNTER — Other Ambulatory Visit: Payer: Self-pay | Admitting: Family Medicine

## 2019-04-07 MED ORDER — TIZANIDINE HCL 4 MG PO TABS: 4.0000 mg | ORAL_TABLET | Freq: Four times a day (QID) | ORAL | 1 refills | Status: DC | PRN

## 2019-04-07 NOTE — Progress Notes (Signed)
Resent tizandiine by e-prescription, was mistaking printed yesterday

## 2019-04-17 ENCOUNTER — Ambulatory Visit: Payer: Self-pay | Admitting: Family Medicine

## 2019-04-18 DIAGNOSIS — J42 Unspecified chronic bronchitis: Secondary | ICD-10-CM | POA: Diagnosis not present

## 2019-04-18 DIAGNOSIS — J449 Chronic obstructive pulmonary disease, unspecified: Secondary | ICD-10-CM | POA: Diagnosis not present

## 2019-04-19 ENCOUNTER — Other Ambulatory Visit: Payer: Self-pay

## 2019-04-19 ENCOUNTER — Telehealth: Payer: Self-pay

## 2019-04-19 NOTE — Telephone Encounter (Signed)
Patient states his pharmacy called and said that Metformin was recalled.  He needs to have something else sent to pharmacy.  He uses [facility] CB# [phone]

## 2019-04-19 NOTE — Telephone Encounter (Signed)
He may not need anything else if he taking the farxiga every day. Suggest he stop metformin, continue farxiga, and we will see hour his A1c is when he comes in for his appointment in July.

## 2019-04-19 NOTE — Telephone Encounter (Signed)
Tried calling; number is ringing busy.   Thanks,   -Mickel Baas

## 2019-04-19 NOTE — Progress Notes (Signed)
This encounter was created in error - please disregard.

## 2019-04-20 NOTE — Telephone Encounter (Signed)
Pt advised.   Thanks,   -Laura  

## 2019-04-21 ENCOUNTER — Telehealth: Payer: Self-pay

## 2019-04-21 DIAGNOSIS — M5410 Radiculopathy, site unspecified: Secondary | ICD-10-CM

## 2019-04-21 MED ORDER — OXYCODONE-ACETAMINOPHEN 10-325 MG PO TABS: 1.0000 | ORAL_TABLET | ORAL | 0 refills | Status: DC | PRN

## 2019-04-21 NOTE — Telephone Encounter (Signed)
Patient is requesting a refill on Oxycodone.

## 2019-05-15 ENCOUNTER — Other Ambulatory Visit: Payer: Self-pay | Admitting: Family Medicine

## 2019-05-15 DIAGNOSIS — G4700 Insomnia, unspecified: Secondary | ICD-10-CM

## 2019-05-15 MED ORDER — TRAZODONE HCL 100 MG PO TABS: 100.0000 mg | ORAL_TABLET | Freq: Every day | ORAL | 0 refills | Status: DC

## 2019-05-15 MED ORDER — TIZANIDINE HCL 4 MG PO TABS: 4.0000 mg | ORAL_TABLET | Freq: Four times a day (QID) | ORAL | 0 refills | Status: DC | PRN

## 2019-05-15 NOTE — Telephone Encounter (Signed)
Dr. [name] patient - Patient called asking for refills on Desyrel 100 mg. and Tizanidine 4 mg. To be sent to [facility] [place].

## 2019-05-16 ENCOUNTER — Telehealth: Payer: Self-pay

## 2019-05-16 DIAGNOSIS — M5410 Radiculopathy, site unspecified: Secondary | ICD-10-CM

## 2019-05-16 MED ORDER — OXYCODONE-ACETAMINOPHEN 10-325 MG PO TABS: 1.0000 | ORAL_TABLET | ORAL | 0 refills | Status: DC | PRN

## 2019-05-16 NOTE — Telephone Encounter (Signed)
Patient of Dr.fisher Patient requesting a refill on the following medication. oxyCODONE-acetaminophen (PERCOCET) 10-325 MG tablet   Per patient he is able to fill it until the 5th of July and is just calling to let us know that he is almost due for the refill.  Pharmacy: [place], [place]

## 2019-05-16 NOTE — Telephone Encounter (Signed)
Refilled for 05/21/19

## 2019-05-17 ENCOUNTER — Ambulatory Visit: Payer: Medicare HMO | Admitting: Family

## 2019-05-18 ENCOUNTER — Telehealth: Payer: Self-pay

## 2019-05-18 DIAGNOSIS — J42 Unspecified chronic bronchitis: Secondary | ICD-10-CM | POA: Diagnosis not present

## 2019-05-18 DIAGNOSIS — J449 Chronic obstructive pulmonary disease, unspecified: Secondary | ICD-10-CM | POA: Diagnosis not present

## 2019-05-18 NOTE — Telephone Encounter (Signed)
ok 

## 2019-05-18 NOTE — Telephone Encounter (Signed)
Please review for Dr. [name].    Pt called stating he had a refill of Oxycodone sent to [facility].  It is dated for 05/21/2019 (Sunday),  Pt states that the pharmacy is closed Saturday and Sunday he would like it dated for Friday if possible.    Thanks,   -Mickel Baas

## 2019-05-18 NOTE — Telephone Encounter (Signed)
Pt called back saying [facility] tiold him to tell us to call them for it to be refilled tomorrow  teri

## 2019-05-18 NOTE — Telephone Encounter (Signed)
Please review. Thanks!  

## 2019-05-21 ENCOUNTER — Other Ambulatory Visit: Payer: Self-pay

## 2019-05-21 DIAGNOSIS — R102 Pelvic and perineal pain: Secondary | ICD-10-CM | POA: Diagnosis not present

## 2019-05-21 DIAGNOSIS — Z87891 Personal history of nicotine dependence: Secondary | ICD-10-CM | POA: Insufficient documentation

## 2019-05-21 DIAGNOSIS — Z79899 Other long term (current) drug therapy: Secondary | ICD-10-CM | POA: Diagnosis not present

## 2019-05-21 DIAGNOSIS — N182 Chronic kidney disease, stage 2 (mild): Secondary | ICD-10-CM | POA: Insufficient documentation

## 2019-05-21 DIAGNOSIS — E1122 Type 2 diabetes mellitus with diabetic chronic kidney disease: Secondary | ICD-10-CM | POA: Insufficient documentation

## 2019-05-21 DIAGNOSIS — I130 Hypertensive heart and chronic kidney disease with heart failure and stage 1 through stage 4 chronic kidney disease, or unspecified chronic kidney disease: Secondary | ICD-10-CM | POA: Insufficient documentation

## 2019-05-21 DIAGNOSIS — R339 Retention of urine, unspecified: Secondary | ICD-10-CM | POA: Diagnosis not present

## 2019-05-21 DIAGNOSIS — I2510 Atherosclerotic heart disease of native coronary artery without angina pectoris: Secondary | ICD-10-CM | POA: Insufficient documentation

## 2019-05-21 DIAGNOSIS — N478 Other disorders of prepuce: Secondary | ICD-10-CM | POA: Diagnosis present

## 2019-05-21 DIAGNOSIS — N472 Paraphimosis: Secondary | ICD-10-CM | POA: Diagnosis not present

## 2019-05-21 DIAGNOSIS — Z7901 Long term (current) use of anticoagulants: Secondary | ICD-10-CM | POA: Insufficient documentation

## 2019-05-21 DIAGNOSIS — Z7984 Long term (current) use of oral hypoglycemic drugs: Secondary | ICD-10-CM | POA: Diagnosis not present

## 2019-05-21 DIAGNOSIS — I5032 Chronic diastolic (congestive) heart failure: Secondary | ICD-10-CM | POA: Diagnosis not present

## 2019-05-21 DIAGNOSIS — J449 Chronic obstructive pulmonary disease, unspecified: Secondary | ICD-10-CM | POA: Insufficient documentation

## 2019-05-22 ENCOUNTER — Other Ambulatory Visit: Payer: Self-pay

## 2019-05-22 ENCOUNTER — Emergency Department
Admission: EM | Admit: 2019-05-22 | Discharge: 2019-05-22 | Disposition: A | Discharge status: Home / Self Care | Payer: Medicare HMO | Attending: Emergency Medicine | Admitting: Emergency Medicine

## 2019-05-22 ENCOUNTER — Encounter: Payer: Self-pay | Admitting: Emergency Medicine

## 2019-05-22 DIAGNOSIS — N472 Paraphimosis: Secondary | ICD-10-CM | POA: Diagnosis not present

## 2019-05-22 MED ORDER — HYDROMORPHONE HCL 1 MG/ML IJ SOLN
1.0000 mg | Freq: Once | INTRAMUSCULAR | Status: AC
  Administered 2019-05-22: 1 mg via INTRAVENOUS
  Filled 2019-05-22: qty 1

## 2019-05-22 MED ORDER — MORPHINE SULFATE (PF) 4 MG/ML IV SOLN
4.0000 mg | Freq: Once | INTRAVENOUS | Status: AC
  Administered 2019-05-22: 4 mg via INTRAVENOUS
  Filled 2019-05-22: qty 1

## 2019-05-22 MED ORDER — LIDOCAINE-PRILOCAINE 2.5-2.5 % EX CREA
TOPICAL_CREAM | Freq: Once | CUTANEOUS | Status: AC
  Administered 2019-05-22: 02:00:00 via TOPICAL
  Filled 2019-05-22: qty 5

## 2019-05-22 NOTE — ED Notes (Signed)
ED Provider at bedside. 

## 2019-05-22 NOTE — ED Notes (Signed)
Patient drove himself to hospital and reports he has no one to pick him up.  Patient instructed that he would have to wait several hours after receiving narcotics to be able to drive himself home.  Patient verbalizes understanding.

## 2019-05-22 NOTE — ED Provider Notes (Signed)
[facility] Emergency Department Provider Note ____________________________________________   I have reviewed the triage vital signs and the nursing notes.   HISTORY  Chief Complaint Groin Swelling   History limited by: Not Limited   HPI Lawrence [name] is a 63 y.o. male who presents to the emergency department today because of concern for inability to return retracted foreskin to normal position. The patient states that he retracted his foreskin around 9pm and has not been able to return it since then. Is having pain to that area. States that his pain is severe. Has been able to urinate since this started.   Records reviewed. Per medical record review patient has a history of afib  Past Medical History:  Diagnosis Date  . A-fib ([place])   . Anemia   . Anginal pain ([place])   . Anxiety   . Arthritis   . Asthma   . CAD (coronary artery disease)    a. 2002 CABGx2 (LIMA->LAD, VG->VG->OM1);  b. 09/2012 DES->OM;  c. 03/2015 PTCA of LAD [facility]) in setting of atretic LIMA; d. 05/2015 Cath [facility]): nonobs dzs; e. 06/2015 Cath ([facility]): LM nl, LAD 45p/d ISR, 50d, D1/2 small, LCX 50p/d ISR, OM1 70ost, 30 ISR, VG->OM1 50ost, 36m LIMA->LAD 99p/d - atretic, RCA dom, nl; f.cath 10/16: 40-50%(FFR 0.90) pLAD, 75% (FFR 0.77) mLAD s/p PCI/DES, oRCA 40% (FFR0.95)  . Celiac disease   . Chronic diastolic CHF (congestive heart failure) ([place]    a. 06/2009 Echo: EF 60-65%, Gr 1 DD, triv AI, mildly dil LA, nl RV.  .Marland KitchenCOPD (chronic obstructive pulmonary disease) ([place]World Golf Village    a. Chronic bronchitis and emphysema.  . Diverticulosis   . Dysrhythmia   . Essential hypertension   . GERD (gastroesophageal reflux disease)   . History of tobacco abuse    a. Quit 2014.  .Marland KitchenPancreatitis   . PSVT (paroxysmal supraventricular tachycardia) ([place]ark[place]    a. 10/2012 Noted on Zio Patch.  . Tubular adenoma of colon   . Type II diabetes mellitus ([facility]     Patient Active Problem List   Diagnosis Date Noted  .  Trigger thumb of right hand 11/28/2018  . Eye pain, left 08/18/2018  . Acute on chronic heart failure (HFrazeys[place] 04/18/2018  . History of adenomatous polyp of colon 04/05/2018  . Abdominal pain, chronic, epigastric 11/06/2017  . Bilateral flank pain 03/24/2017  . Dyspnea 04/03/2016  . Hypotension 04/03/2016  . CKD (chronic kidney disease) stage 2, GFR 60-89 ml/min 04/03/2016  . Anemia 04/03/2016  . Paroxysmal atrial fibrillation ([place] 12/23/2015  . OSA (obstructive sleep apnea) 12/10/2015  . Left inguinal hernia 11/07/2015  . Anxiety 11/07/2015  . Unstable angina ([place] 10/05/2015  . Back pain with left-sided radiculopathy 09/30/2015  . Nocturnal hypoxia 09/06/2015  . BPH (benign prostatic hyperplasia) 08/01/2015  . Chronic diastolic CHF (congestive heart failure) (HParkway [place]   . Angina pectoris ([place]asatiem[place]   . Chest pain 07/11/2015  . COPD (chronic obstructive pulmonary disease) ([place] 07/03/2015  . CAD (coronary artery disease) 06/26/2015  . HTN (hypertension) 06/26/2015  . 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HLees[place] 06/26/2015  . Achalasia 07/24/2014  . GERD (gastroesophageal reflux disease) 06/07/2014  . Former tobacco use 04/11/2013  . HLD (hyperlipidemia) 04/09/2013    Past Surgical History:  Procedure Laterality Date  . BYPASS GRAFT    . CARDIAC CATHETERIZATION N/A 07/12/2015   rocedure: Left Heart Cath and Cors/Grafts Angiography;  Surgeon: HBelva Crome MD;  [place] [place];  Service: Cardiovascular;  Laterality: N/A;  . CARDIAC CATHETERIZATION Right 10/07/2015   Procedure: Left Heart Cath and Cors/Grafts Angiography;  Surgeon: Dionisio David, MD;  Location: [place] CV LAB;  Service: Cardiovascular;  Laterality: Right;  . CARDIAC CATHETERIZATION N/A 04/06/2016   Procedure: Left Heart Cath and Coronary Angiography;  Surgeon: Yolonda Kida, MD;  Location: [place] CV LAB;  Service: Cardiovascular;  Laterality: N/A;  . CARDIAC CATHETERIZATION  04/06/2016    Procedure: Bypass Graft Angiography;  Surgeon: Yolonda Kida, MD;  Location: [place] CV LAB;  Service: Cardiovascular;;  . CARDIAC CATHETERIZATION N/A 11/02/2016   Procedure: Left Heart Cath and Cors/Grafts Angiography and possible PCI;  Surgeon: Yolonda Kida, MD;  Location: [place] CV LAB;  Service: Cardiovascular;  Laterality: N/A;  . CARDIAC CATHETERIZATION N/A 11/02/2016   Procedure: Coronary Stent Intervention;  Surgeon: Yolonda Kida, MD;  Location: [place] CV LAB;  Service: Cardiovascular;  Laterality: N/A;  . CHOLECYSTECTOMY    . COLONOSCOPY WITH PROPOFOL N/A 04/01/2018   Procedure: COLONOSCOPY WITH PROPOFOL;  Surgeon: Manya Silvas, MD;  Location[facility] ENDOSCOPY;  Service: Endoscopy;  Laterality: N/A;  . ESOPHAGEAL DILATION    . ESOPHAGOGASTRODUODENOSCOPY (EGD) WITH PROPOFOL N/A 04/01/2018   Procedure: ESOPHAGOGASTRODUODENOSCOPY (EGD) WITH PROPOFOL;  Surgeon: Manya Silvas, MD;  Location: [facility] ENDOSCOPY;  Service: Endoscopy;  Laterality: N/A;  . TONSILLECTOMY    . VASCULAR SURGERY      Prior to Admission medications   Medication Sig Start Date End Date Taking? Authorizing Provider  albuterol (PROVENTIL HFA;VENTOLIN HFA) 108 (90 Base) MCG/ACT inhaler Inhale 2 puffs into the lungs every 4 (four) hours as needed for wheezing or shortness of breath. Reported on 04/08/2016 10/21/17   Birdie Sons, MD  albuterol (PROVENTIL) (2.5 MG/3ML) 0.083% nebulizer solution Take 3 mLs (2.5 mg total) by nebulization every 6 (six) hours as needed for wheezing or shortness of breath. 01/04/18   Birdie Sons, MD  allopurinol (ZYLOPRIM) 300 MG tablet Take 1 tablet (300 mg total) by mouth 2 (two) times daily. 10/05/18   Birdie Sons, MD  ALPRAZolam Duanne Moron) 1 MG tablet TAKE ONE-HALF TO ONE TABLET BY MOUTH THREE TIMES A DAY AS NEEDED 02/23/19   Birdie Sons, MD  aspirin 81 MG chewable tablet Chew 81 mg by mouth daily.    [provider]  atorvastatin  (LIPITOR) 80 MG tablet TAKE ONE TABLET BY MOUTH AT BEDTIME. Patient taking differently: Take 80 mg by mouth at bedtime.  09/19/18   Birdie Sons, MD  Blood Glucose Monitoring Suppl (ACCU-CHEK AVIVA PLUS) w/Device KIT CHECK BLOOD SUGAR EVERY DAY 12/08/17   Birdie Sons, MD  cetirizine (ZYRTEC) 10 MG tablet TAKE ONE TABLET AT BEDTIME Patient taking differently: Take 10 mg by mouth at bedtime.  07/11/18   Birdie Sons, MD  cyclobenzaprine (FLEXERIL) 5 MG tablet TAKE 1 TO 2 TABLETS BY MOUTH THREE TIMESDAILY AS NEEDED (BACK PAIN) Patient not taking: No sig reported 11/18/18   Birdie Sons, MD  dapagliflozin propanediol (FARXIGA) 5 MG TABS tablet Take 5 mg by mouth daily. 11/29/18   Birdie Sons, MD  docusate sodium (COLACE) 100 MG capsule Take 100 mg by mouth daily.     [provider]  ELIQUIS 5 MG TABS tablet TAKE 1 TABLET BY MOUTH TWICE A DAY. Patient taking differently: Take 5 mg by mouth 2 (two) times daily.  08/06/18   Birdie Sons, MD  fluticasone furoate-vilanterol (BREO ELLIPTA) 100-25  MCG/INH AEPB Inhale 1 puff into the lungs daily. 01/19/19   Kendell Bane, NP  glucose blood (ACCU-CHEK AVIVA PLUS) test strip Use to check blood sugar once a day 02/14/18   Birdie Sons, MD  isosorbide mononitrate (IMDUR) 120 MG 24 hr tablet TAKE 1 TABLET BY MOUTH DAILY 01/19/19   Birdie Sons, MD  ketoconazole (NIZORAL) 2 % cream Apply 1 application topically daily. 11/28/18   Birdie Sons, MD  LINZESS 72 MCG capsule Take 72 mcg by mouth daily.    [provider]  lisinopril (PRINIVIL,ZESTRIL) 2.5 MG tablet TAKE 1 TABLET BY MOUTH DAILY Patient taking differently: Take 2.5 mg by mouth daily.  10/20/18   Birdie Sons, MD  magnesium oxide (MAG-OX) 400 (241.3 Mg) MG tablet TAKE 1 TABLET BY MOUTH DAILY Patient taking differently: Take 400 mg by mouth daily.  10/28/18   Alisa Graff, FNP  metFORMIN (GLUCOPHAGE) 500 MG tablet Take 1 tablet (500 mg total) by mouth 2  (two) times daily with a meal. 06/15/18   [name], [name], MD  nitroGLYCERIN (NITROSTAT) 0.4 MG SL tablet Place 1 tablet (0.4 mg total) under the tongue every 5 (five) minutes as needed for chest pain. Reported on 05/26/2016 11/28/18   Birdie Sons, MD  omega-3 acid ethyl esters (LOVAZA) 1 g capsule TAKE FOUR CAPSULES BY MOUTH DAILY Patient taking differently: Take 4 g by mouth daily.  06/24/18   Birdie Sons, MD  oxyCODONE-acetaminophen (PERCOCET) 10-325 MG tablet Take 1 tablet by mouth every 4 (four) hours as needed. 05/21/19   Mar Daring, PA-C  OXYGEN Inhale 2 L into the lungs. Wears ATC and at night    [provider]  sotalol (BETAPACE) 80 MG tablet Take 80 mg by mouth 2 (two) times daily.  10/05/18   [provider]  tamsulosin (FLOMAX) 0.4 MG CAPS capsule Take 0.4 mg by mouth daily.     [provider]  tiotropium (SPIRIVA HANDIHALER) 18 MCG inhalation capsule Place 1 capsule (18 mcg total) into inhaler and inhale daily. 02/20/19   Birdie Sons, MD  tiZANidine (ZANAFLEX) 4 MG tablet Take 1 tablet (4 mg total) by mouth every 6 (six) hours as needed for muscle spasms. 05/15/19   Chrismon, Vickki Muff, PA  torsemide (DEMADEX) 100 MG tablet Take 0.5 tablets by mouth as needed.  10/11/17   [provider]  traZODone (DESYREL) 100 MG tablet Take 1 tablet (100 mg total) by mouth at bedtime. For sleep 05/15/19   Chrismon, Vickki Muff, PA  venlafaxine XR (EFFEXOR-XR) 75 MG 24 hr capsule Take 1 capsule (75 mg total) by mouth daily with breakfast. 04/04/19   Birdie Sons, MD    Allergies Demerol [meperidine], Jardiance [empagliflozin], Prednisone, Sulfa antibiotics, Albuterol sulfate [albuterol], and Morphine sulfate  Family History  Problem Relation Age of Onset  . Heart attack Mother   . Depression Mother   . Heart disease Mother   . COPD Mother   . Hypertension Mother   . Heart attack Father   . Diabetes Father   . Depression Father   . Heart  disease Father   . Cirrhosis Father   . Parkinson's disease Brother     Social History Social History   Tobacco Use  . Smoking status: Former Smoker    Quit date: 04/22/2013    Years since quitting: 6.0  . Smokeless tobacco: Never Used  Substance Use Topics  . Alcohol use: No  Comment: remotely quit alcohol use. Hx of heavy alcohol use.  . Drug use: No    Review of Systems Constitutional: No fever/chills Eyes: No visual changes. ENT: No sore throat. Cardiovascular: Denies chest pain. Respiratory: Denies shortness of breath. Gastrointestinal: No abdominal pain.  No nausea, no vomiting.  No diarrhea.   Genitourinary: Positive for penile pain.  Musculoskeletal: Negative for back pain. Skin: Negative for rash. Neurological: Negative for headaches, focal weakness or numbness.  ____________________________________________   PHYSICAL EXAM:  VITAL SIGNS: ED Triage Vitals  Enc Vitals Group     BP 05/22/19 0030 (!) 169/89     Pulse Rate 05/22/19 0030 (!) 51     Resp 05/22/19 0030 (!) 21     Temp 05/22/19 0030 98.5 F (36.9 C)     Temp Source 05/22/19 0030 Oral     SpO2 05/22/19 0030 94 %     Weight 05/22/19 0031 247 lb 0.1 oz (112 kg)     Height 05/22/19 0031 5' 7" (1.702 m)     Head Circumference --      Peak Flow --      Pain Score 05/22/19 0031 9   Constitutional: Alert and oriented.  Eyes: Conjunctivae are normal.  ENT      Head: Normocephalic and atraumatic.      Nose: No congestion/rhinnorhea.      Mouth/Throat: Mucous membranes are moist.      Neck: No stridor. Hematological/Lymphatic/Immunilogical: No cervical lymphadenopathy. Cardiovascular: Normal rate, regular rhythm.  No murmurs, rubs, or gallops.  Respiratory: Normal respiratory effort without tachypnea nor retractions. Breath sounds are clear and equal bilaterally. No wheezes/rales/rhonchi. Gastrointestinal: Soft and non tender. No rebound. No guarding.  Genitourinary: Swollen and tender glans,  foreskin swollen and retracted.  Musculoskeletal: Normal range of motion in all extremities. No lower extremity edema. Neurologic:  Normal speech and language. No gross focal neurologic deficits are appreciated.  Skin:  Skin is warm, dry and intact. No rash noted. Psychiatric: Mood and affect are normal. Speech and behavior are normal. Patient exhibits appropriate insight and judgment.  ____________________________________________    LABS (pertinent positives/negatives)  None  ____________________________________________   EKG  None  ____________________________________________    RADIOLOGY  None  ____________________________________________   PROCEDURES  Procedures  ____________________________________________   INITIAL IMPRESSION / ASSESSMENT AND PLAN / ED COURSE  Pertinent labs & imaging results that were available during my care of the patient were reviewed by me and considered in my medical decision making (see chart for details).   Patient presented with concern for penile pain and swelling. Exam is consistent with paraphimosis. Did attempt manual reduction however was unsuccessful. Consulted Dr. Gloriann Loan with urology who came and performed nerve block and was able to return foreskin to normal anatomical position. Discussed with patient importance of urology follow up.   ____________________________________________   FINAL CLINICAL IMPRESSION(S) / ED DIAGNOSES  Final diagnoses:  Paraphimosis     Note: This dictation was prepared with Dragon dictation. Any transcriptional errors that result from this process are unintentional     Nance Pear, MD 05/22/19 [phone]

## 2019-05-22 NOTE — ED Triage Notes (Signed)
Pt says he is uncircumcised; around 9pm tonight he pushed the foreskin back to wash and has been unable to get the foreskin back over the head of the penis; now he has swelling to the head of his penis and the skin around it; pt adds he was to be circumcised in the past few months but had to cancel the appointment when his wife found out she had cancer; pt says he has voided since without difficulty;

## 2019-05-22 NOTE — ED Notes (Signed)
Urologist at the bedside

## 2019-05-22 NOTE — ED Notes (Signed)
Patient assisted with urinal

## 2019-05-22 NOTE — Consult Note (Signed)
H&P Physician requesting consult: Nance Pear, MD  Chief Complaint: Paraphimosis  History of Present Illness: 63 year old male presented with a several hour history of penile swelling.  He was found to have paraphimosis.  This was unable to be reduced by emergency department physician and therefore I was consulted.  He has never had this happen before.  He states he is currently being evaluated for possible circumcision.  He states he has been trying to get an appointment for this.  Past Medical History:  Diagnosis Date  . A-fib ([place])   . Anemia   . Anginal pain ([place])   . Anxiety   . Arthritis   . Asthma   . CAD (coronary artery disease)    a. 2002 CABGx2 (LIMA->LAD, VG->VG->OM1);  b. 09/2012 DES->OM;  c. 03/2015 PTCA of LAD [facility]) in setting of atretic LIMA; d. 05/2015 Cath [facility]): nonobs dzs; e. 06/2015 Cath ([facility]): LM nl, LAD 45p/d ISR, 50d, D1/2 small, LCX 50p/d ISR, OM1 70ost, 30 ISR, VG->OM1 50ost, 26m LIMA->LAD 99p/d - atretic, RCA dom, nl; f.cath 10/16: 40-50%(FFR 0.90) pLAD, 75% (FFR 0.77) mLAD s/p PCI/DES, oRCA 40% (FFR0.95)  . Celiac disease   . Chronic diastolic CHF (congestive heart failure) ([place]    a. 06/2009 Echo: EF 60-65%, Gr 1 DD, triv AI, mildly dil LA, nl RV.  .Marland KitchenCOPD (chronic obstructive pulmonary disease) ([place]ud[place]    a. Chronic bronchitis and emphysema.  . Diverticulosis   . Dysrhythmia   . Essential hypertension   . GERD (gastroesophageal reflux disease)   . History of tobacco abuse    a. Quit 2014.  .Marland KitchenPancreatitis   . PSVT (paroxysmal supraventricular tachycardia) (HNa[place]    a. 10/2012 Noted on Zio Patch.  . Tubular adenoma of colon   . Type II diabetes mellitus ([place]    Past Surgical History:  Procedure Laterality Date  . BYPASS GRAFT    . CARDIAC CATHETERIZATION N/A 07/12/2015   rocedure: Left Heart Cath and Cors/Grafts Angiography;  Surgeon: HBelva Crome MD;  [place] [facility];  Service: Cardiovascular;  Laterality: N/A;  . CARDIAC  CATHETERIZATION Right 10/07/2015   Procedure: Left Heart Cath and Cors/Grafts Angiography;  Surgeon: SDionisio David MD;  [place] [facility];  Service: Cardiovascular;  Laterality: Right;  . CARDIAC CATHETERIZATION N/A 04/06/2016   Procedure: Left Heart Cath and Coronary Angiography;  Surgeon: DYolonda Kida MD;  [place] [facility];  Service: Cardiovascular;  Laterality: N/A;  . CARDIAC CATHETERIZATION  04/06/2016   Procedure: Bypass Graft Angiography;  Surgeon: DYolonda Kida MD;  [place];  Service: Cardiovascular;;  . CARDIAC CATHETERIZATION N/A 11/02/2016   Procedure: Left Heart Cath and Cors/Grafts Angiography and possible PCI;  Surgeon: DYolonda Kida MD;  [place];  Service: Cardiovascular;  Laterality: N/A;  . CARDIAC CATHETERIZATION N/A 11/02/2016   Procedure: Coronary Stent Intervention;  Surgeon: DYolonda Kida MD;  Location: [place];  Service: Cardiovascular;  Laterality: N/A;  . CHOLECYSTECTOMY    . COLONOSCOPY WITH PROPOFOL N/A 04/01/2018   Procedure: COLONOSCOPY WITH PROPOFOL;  Surgeon: EManya Silvas MD;  [facility];  Service: Endoscopy;  Laterality: N/A;  . ESOPHAGEAL DILATION    . ESOPHAGOGASTRODUODENOSCOPY (EGD) WITH PROPOFOL N/A 04/01/2018   Procedure: ESOPHAGOGASTRODUODENOSCOPY (EGD) WITH PROPOFOL;  Surgeon: EManya Silvas MD;  [facility];  Service: Endoscopy;  Laterality: N/A;  . TONSILLECTOMY    . VASCULAR SURGERY  Home Medications:  (Not in a hospital admission)  Allergies:  Allergies  Allergen Reactions  . Demerol [Meperidine] Hives  . Jardiance [Empagliflozin] Other (See Comments)    Perineal pain  . Prednisone Other (See Comments) and Hypertension    Pt states that this medication puts him in A-fib   . Sulfa Antibiotics Hives  . Albuterol Sulfate [Albuterol] Palpitations and Other (See Comments)    Pt currently uses this  medication.    . Morphine Sulfate Nausea And Vomiting, Rash and Other (See Comments)    Pt states that he is only allergic to the tablet form of this medication.      Family History  Problem Relation Age of Onset  . Heart attack Mother   . Depression Mother   . Heart disease Mother   . COPD Mother   . Hypertension Mother   . Heart attack Father   . Diabetes Father   . Depression Father   . Heart disease Father   . Cirrhosis Father   . Parkinson's disease Brother    Social History:  reports that he quit smoking about 6 years ago. He has never used smokeless tobacco. He reports that he does not drink alcohol or use drugs.  ROS: A complete review of systems was performed.  All systems are negative except for pertinent findings as noted. ROS   Physical Exam:  Vital signs in last 24 hours: Temp:  [98.2 F (36.8 C)-98.5 F (36.9 C)] 98.2 F (36.8 C) (07/06 0717) Pulse Rate:  [50-96] 96 (07/06 0717) Resp:  [18-21] 18 (07/06 0717) BP: (127-170)/(58-91) 153/86 (07/06 0717) SpO2:  [84 %-99 %] 98 % (07/06 0717) Weight:  [014 kg] 112 kg (07/06 0031) General:  Alert and oriented, No acute distress HEENT: Normocephalic, atraumatic Neck: No JVD or lymphadenopathy Cardiovascular: Regular rate and rhythm Lungs: Regular rate and effort Abdomen: Soft, nontender, nondistended, no abdominal masses Back: No CVA tenderness Genitourinary: Penis with paraphimosis Extremities: No edema Neurologic: Grossly intact  Laboratory Data:  No results found for this or any previous visit (from the past 24 hour(s)). No results found for this or any previous visit (from the past 240 hour(s)). Creatinine: No results for input(s): CREATININE in the last 168 hours.  Procedure: Alcohol prep was used on the skin surrounding the penis.  I then used 1% lidocaine without epinephrine to perform a dorsal nerve block with 10 cc.  I used another 10 cc to perform a penile ring block.  I then was able to manually  reduce the paraphimosis successfully.   Impression/Assessment:  Paraphimosis  Plan:  Okay for discharge at this point.  He can follow-up outpatient for discussion of possible circumcision.  Marton Redwood, III 05/22/2019, 8:13 AM

## 2019-05-22 NOTE — Discharge Instructions (Addendum)
You will likely have some swelling and bleeding for the next few hours. Should your pain return or the swelling appears to get worse please return to the emergency department. Please seek medical attention for any high fevers, chest pain, shortness of breath, change in behavior, persistent vomiting, bloody stool or any other new or concerning symptoms.

## 2019-05-25 ENCOUNTER — Telehealth: Payer: Self-pay | Admitting: Radiology

## 2019-05-25 ENCOUNTER — Ambulatory Visit (INDEPENDENT_AMBULATORY_CARE_PROVIDER_SITE_OTHER): Payer: Medicare HMO | Admitting: Urology

## 2019-05-25 ENCOUNTER — Other Ambulatory Visit: Payer: Self-pay

## 2019-05-25 ENCOUNTER — Encounter: Payer: Self-pay | Admitting: Urology

## 2019-05-25 ENCOUNTER — Other Ambulatory Visit: Payer: Self-pay | Admitting: Radiology

## 2019-05-25 VITALS — BP 153/92 | HR 69 | Ht 67.0 in | Wt 248.0 lb

## 2019-05-25 DIAGNOSIS — N472 Paraphimosis: Secondary | ICD-10-CM | POA: Diagnosis not present

## 2019-05-25 DIAGNOSIS — R300 Dysuria: Secondary | ICD-10-CM | POA: Diagnosis not present

## 2019-05-25 NOTE — Telephone Encounter (Signed)
Patient was given the [facility] Information form below as well as the Instructions for Pre-Admission Testing form & a map of [facility].    [place] [place], [place], [place] [address] Telephone: [phone] Fax: [phone]   Thank you for choosing [facility] for your upcoming surgery!  We are always here to assist in your urological needs.  Please read the following information with specific details for your upcoming appointments related to your surgery. Please contact Migel Hannis at [phone] Option 3 with any questions.  The Name of Your Surgery: circumcision Your Surgery Date: 06/09/2019 Your Surgeon: Nickolas Madrid  Please call Same Day Surgery at [phone] between the hours of 1pm-3pm one day prior to your surgery. They will inform you of the time to arrive at Same Day Surgery which is located on the second floor of the [facility].   Please refer to the attached letter regarding instructions for Pre-Admission Testing. You will receive a call from the [employer] office regarding your appointment with them.  The Pre-Admission Testing office is located at [place], on the first floor of the [place] [facility] [facility] in [place] (office is to the right as you enter through the [employer] of the [facility]). Please have all medications you are currently taking and your insurance card available.  A COVID-19 test will be required prior to surgery and once test is performed you will need to remain in quarantine until the day of surgery. Patient was advised to have nothing to eat or drink after midnight the night prior to surgery except that he may have only water until 2 hours before surgery with nothing to drink within 2 hours of surgery.  The patient states he currently takes Aspirin 74m and Eliquis. Patient will be  informed when to stop medications pending clearance from cardiologist. Patient's questions were answered and he expressed understanding of these instructions.

## 2019-05-25 NOTE — Patient Instructions (Signed)
Circumcision Information  Boys are born with a fold of skin that covers the head of the penis (foreskin). This fold of skin is often removed shortly after birth with a surgery that is called circumcision. A circumcision may be done by health care providers who are involved in newborn care. It may also be done by a specialist who cares for the urinary tract (urologist). Who should be circumcised? The decision to leave the foreskin on or to have it removed is a personal one. It is often based on religious, social, or cultural beliefs. Circumcision is most often done in the first few days of life, but it may also be done later in life. In general:  All healthy boys with a normal penis formation can be circumcised in the first few days after birth.  Boys who are born early (prematurely) or who are ill should not be circumcised until they are older and stronger.  Boys with certain deformities of the penis or deformities of the opening of the penis (urethra) should not be circumcised. How is circumcision done?  The penis and the area around it are cleansed well.  An injection is given to numb the area. A numbing cream may be applied to the area.  A special clamp or ring is attached to the penis and used to remove the foreskin.  The area is then cleansed well again.  Medicine and gauze are applied to it. What are the benefits of circumcision? When the foreskin is removed:  The head of the penis is easier to wash. This lowers the risk for odors, swelling, and infection.  Some men are less likely to: ? Carry the virus that causes genital warts (human papillomavirus or HPV). ? Contract HIV (human immunodeficiency virus). ? Develop cancer of the penis. ? Get urinary infections. ? Develop inflammation of the penis. What are the risks of circumcision? Circumcision is a safe procedure. However, problems may occur, including:  Infection.  Bleeding.  Removal of too much or too little  foreskin. This affects the appearance of the penis.  Irritation and narrowing of the urinary opening. This is usually temporary.  Scarring of the penis. This may affect the way the penis functions. Summary  Boys are born with a fold of skin that covers the head of the penis (foreskin). This fold of skin is often removed shortly after birth with a surgery that is called circumcision.  When the foreskin is removed, the head of the penis is easier to wash and keep clean.  Some men who are circumcised are less likely to carry viruses, get urinary infections, or develop cancer of the penis.  Circumcision is a safe procedure. However, problems may occur, including infection, bleeding, scarring, and irritation and narrowing of the opening of the penis. This information is not intended to replace advice given to you by your health care provider. Make sure you discuss any questions you have with your health care provider. Document Released: 10/30/2000 Document Revised: 04/18/2018 Document Reviewed: 04/18/2018 Elsevier Patient Education  2020 [employer].     Circumcision Postoperative Instructions  What to Expect  Slight redness, swelling, and scant drainage along the incision  Mild to moderate discomfort  Bruising as the tissue heals  Low grade fever  Penile sensitivity  Activity  No sexual intercourse until cleared by physician  Check with physician regarding specific work instructions  Wound Care  Shower only after 48 hours  Not tub baths until approved by physician  Let warm, soapy  water flow over the incision and pat dry  Ice packs for 48 hours; 15 minutes on and 15 minutes off  Remove dressing in 24 hours (or earlier if unable to urinate)  Diet and Elimination  Diet as tolerated  Increase water intake to keep urine clear  No straining with bowel movements  Problem to Report  Generalized redness  Increased pain and swelling  Fever greater than 101  Difficulty voiding   Severe nausea and vomiting  Significant drainage or bleeding from the wound

## 2019-05-25 NOTE — Progress Notes (Signed)
05/25/19 3:27 PM   Lawrence [name] Apr 14, 1956 [id number]  Referring provider: Birdie Sons, MD [address] [place],  [place] [address]  CC: History of paraphimosis  HPI: I saw Lawrence [name] in urology clinic today in consultation for circumcision from Dr. [name].  He is an extremely co-morbid 63 year old male with history of diabetes, COPD on oxygen, CAD and CABG on aspirin and Eliquis, atrial fibrillation, and congestive heart failure.  He denies any recent use of nitrates for chest pain over the last year.  He was recently seen in the ER on 05/22/2019 with paraphimosis that required reduction by urology with Dr. Gloriann Loan.  He reports multiple episodes of painful paraphimosis in the past.  He is strongly interested in pursuing circumcision to prevent further episodes of paraphimosis.  There are no aggravating or alleviating factors.  Severity is moderate.  Duration is greater than 1 year.   PMH: Past Medical History:  Diagnosis Date  . A-fib ([place])   . Anemia   . Anginal pain ([place])   . Anxiety   . Arthritis   . Asthma   . CAD (coronary artery disease)    a. 2002 CABGx2 (LIMA->LAD, VG->VG->OM1);  b. 09/2012 DES->OM;  c. 03/2015 PTCA of LAD [facility]) in setting of atretic LIMA; d. 05/2015 Cath [facility]): nonobs dzs; e. 06/2015 Cath ([facility]): LM nl, LAD 45p/d ISR, 50d, D1/2 small, LCX 50p/d ISR, OM1 70ost, 30 ISR, VG->OM1 50ost, 56m LIMA->LAD 99p/d - atretic, RCA dom, nl; f.cath 10/16: 40-50%(FFR 0.90) pLAD, 75% (FFR 0.77) mLAD s/p PCI/DES, oRCA 40% (FFR0.95)  . Celiac disease   . Chronic diastolic CHF (congestive heart failure) ([place]    a. 06/2009 Echo: EF 60-65%, Gr 1 DD, triv AI, mildly dil LA, nl RV.  .Marland KitchenCOPD (chronic obstructive pulmonary disease) ([place]    a. Chronic bronchitis and emphysema.  . Diverticulosis   . Dysrhythmia   . Essential hypertension   . GERD (gastroesophageal reflux disease)   . History of tobacco abuse    a. Quit 2014.  .Marland KitchenPancreatitis   . PSVT (paroxysmal  supraventricular tachycardia) ([place]    a. 10/2012 Noted on Zio Patch.  . Tubular adenoma of colon   . Type II diabetes mellitus ([place]     Surgical History: Past Surgical History:  Procedure Laterality Date  . BYPASS GRAFT    . CARDIAC CATHETERIZATION N/A 07/12/2015   rocedure: Left Heart Cath and Cors/Grafts Angiography;  Surgeon: HBelva Crome MD;  [facility];  Service: Cardiovascular;  Laterality: N/A;  . CARDIAC CATHETERIZATION Right 10/07/2015   Procedure: Left Heart Cath and Cors/Grafts Angiography;  Surgeon: SDionisio David MD;  [facility];  Service: Cardiovascular;  Laterality: Right;  . CARDIAC CATHETERIZATION N/A 04/06/2016   Procedure: Left Heart Cath and Coronary Angiography;  Surgeon: DYolonda Kida MD;  [facility];  Service: Cardiovascular;  Laterality: N/A;  . CARDIAC CATHETERIZATION  04/06/2016   Procedure: Bypass Graft Angiography;  Surgeon: DYolonda Kida MD;  [facility];  Service: Cardiovascular;;  . CARDIAC CATHETERIZATION N/A 11/02/2016   Procedure: Left Heart Cath and Cors/Grafts Angiography and possible PCI;  Surgeon: DYolonda Kida MD;  [facility];  Service: Cardiovascular;  Laterality: N/A;  . CARDIAC CATHETERIZATION N/A 11/02/2016   Procedure: Coronary Stent Intervention;  Surgeon: DYolonda Kida MD;  [facility];  Service: Cardiovascular;  Laterality: N/A;  . CHOLECYSTECTOMY    .  COLONOSCOPY WITH PROPOFOL N/A 04/01/2018   Procedure: COLONOSCOPY WITH PROPOFOL;  Surgeon: Manya Silvas, MD;  Location[facility];  Service: Endoscopy;  Laterality: N/A;  . ESOPHAGEAL DILATION    . ESOPHAGOGASTRODUODENOSCOPY (EGD) WITH PROPOFOL N/A 04/01/2018   Procedure: ESOPHAGOGASTRODUODENOSCOPY (EGD) WITH PROPOFOL;  Surgeon: Manya Silvas, MD;  Location: [facility] ENDOSCOPY;  Service: Endoscopy;  Laterality: N/A;  . TONSILLECTOMY    . VASCULAR  SURGERY      Allergies:  Allergies  Allergen Reactions  . Demerol  [Meperidine Hcl]   . Demerol [Meperidine] Hives  . Jardiance [Empagliflozin] Other (See Comments)    Perineal pain  . Prednisone Other (See Comments) and Hypertension    Pt states that this medication puts him in A-fib   . Sulfa Antibiotics Hives  . Albuterol Sulfate [Albuterol] Palpitations and Other (See Comments)    Pt currently uses this medication.    . Morphine Sulfate Nausea And Vomiting, Rash and Other (See Comments)    Pt states that he is only allergic to the tablet form of this medication.      Family History: Family History  Problem Relation Age of Onset  . Heart attack Mother   . Depression Mother   . Heart disease Mother   . COPD Mother   . Hypertension Mother   . Heart attack Father   . Diabetes Father   . Depression Father   . Heart disease Father   . Cirrhosis Father   . Parkinson's disease Brother     Social History:  reports that he quit smoking about 6 years ago. He has never used smokeless tobacco. He reports that he does not drink alcohol or use drugs.  ROS: Please see flowsheet from today's date for complete review of systems.  Physical Exam: BP (!) 153/92 (BP Location: Left Arm, Patient Position: Sitting)   Pulse 69   Ht 5' 7"  (1.702 m)   Wt 248 lb (112.5 kg)   BMI 38.84 kg/m    Constitutional:  Alert and oriented, No acute distress. Cardiovascular: No clubbing, cyanosis, or edema. Respiratory: Normal respiratory effort, no increased work of breathing. GI: Abdomen is soft, nontender, nondistended, no abdominal masses GU: Phallus with tight phimosis, unable to reduce foreskin to examine the glans Lymph: No cervical or inguinal lymphadenopathy. Skin: No rashes, bruises or suspicious lesions. Neurologic: Grossly intact, no focal deficits, moving all 4 extremities. Psychiatric: Normal mood and affect.  Laboratory Data: Reviewed  Assessment & Plan:   In summary, the  patient is a extremely comorbid 63 year old male on anticoagulation with aspirin and Eliquis for history of CAD as well as atrial fibrillation who desires circumcision for recurrent episodes of paraphimosis, most recently requiring ER visit on 05/22/2019.  We discussed at length his multiple co-morbidities that make him high risk for any surgery.  He understands this.  We discussed the risk of circumcision including bleeding, infection, pain, and change in sensation.  He would like to proceed.  -We will need to hold anticoagulation at least 3 days preop, and 3 to 5 days postop -We will proceed with circumcision if cleared by cardiology to hold anticoagulation, otherwise would need to consider dorsal slit under local anesthetic in clinic.  Billey Co, [facility] [address], [place],  [address] [phone]

## 2019-05-25 NOTE — Addendum Note (Signed)
Addended by: Feliberto Gottron on: 05/25/2019 03:52 PM   Modules accepted: Orders

## 2019-05-26 LAB — MICROSCOPIC EXAMINATION: Bacteria, UA: NONE SEEN

## 2019-05-26 LAB — URINALYSIS, COMPLETE
Bilirubin, UA: NEGATIVE
Ketones, UA: NEGATIVE
Leukocytes,UA: NEGATIVE
Nitrite, UA: NEGATIVE
Protein,UA: NEGATIVE
Specific Gravity, UA: 1.01 (ref 1.005–1.030)
Urobilinogen, Ur: 0.2 mg/dL (ref 0.2–1.0)
pH, UA: 5 (ref 5.0–7.5)

## 2019-05-28 LAB — CULTURE, URINE COMPREHENSIVE

## 2019-05-29 ENCOUNTER — Other Ambulatory Visit: Payer: Self-pay

## 2019-05-29 ENCOUNTER — Ambulatory Visit (INDEPENDENT_AMBULATORY_CARE_PROVIDER_SITE_OTHER): Payer: Medicare HMO | Admitting: Family Medicine

## 2019-05-29 ENCOUNTER — Encounter: Payer: Self-pay | Admitting: Family Medicine

## 2019-05-29 VITALS — BP 136/82 | HR 76 | Temp 98.1°F | Resp 18 | Wt 245.8 lb

## 2019-05-29 DIAGNOSIS — E1121 Type 2 diabetes mellitus with diabetic nephropathy: Secondary | ICD-10-CM | POA: Diagnosis not present

## 2019-05-29 DIAGNOSIS — I25118 Atherosclerotic heart disease of native coronary artery with other forms of angina pectoris: Secondary | ICD-10-CM

## 2019-05-29 DIAGNOSIS — I509 Heart failure, unspecified: Secondary | ICD-10-CM

## 2019-05-29 DIAGNOSIS — J9611 Chronic respiratory failure with hypoxia: Secondary | ICD-10-CM | POA: Diagnosis not present

## 2019-05-29 LAB — POCT GLYCOSYLATED HEMOGLOBIN (HGB A1C): Hemoglobin A1C: 7.9 % — AB (ref 4.0–5.6)

## 2019-05-29 MED ORDER — FARXIGA 10 MG PO TABS: 10.0000 mg | ORAL_TABLET | Freq: Every day | ORAL | 12 refills | Status: DC

## 2019-05-29 MED ORDER — METHOCARBAMOL 500 MG PO TABS: 500.0000 mg | ORAL_TABLET | Freq: Four times a day (QID) | ORAL | 3 refills | Status: DC

## 2019-05-29 NOTE — Progress Notes (Addendum)
Patient: Lawrence [name] Male    DOB: 1956/05/16   63 y.o.   MRN: [id number] Visit Date: 05/29/2019  Today's Provider: [name], MD   No chief complaint on file.  Subjective:     Medication follow up for Trazodone started in May for insomnia, venlafaxine for situational stress. He is sleeping better and is not as anxious, feels that both medications are working fairly well. Is not aware of any side effects.   He is also due for follow up diabetes, lipids and htn.  Lab Results  Component Value Date   HGBA1C 7.9 (A) 05/29/2019   HGBA1C 6.8 10/15/2018   HGBA1C 7.0 (A) 07/27/2018   Is tolerating medications well with no adverse effects.   Lab Results  Component Value Date   CHOL 125 04/19/2018   CHOL 151 01/10/2018   CHOL 157 10/31/2016   Lab Results  Component Value Date   HDL 25 (L) 04/19/2018   HDL 38 (L) 01/10/2018   HDL 30 (L) 10/31/2016   Lab Results  Component Value Date   LDLCALC 41 04/19/2018   LDLCALC 56 01/10/2018   LDLCALC UNABLE TO CALCULATE IF TRIGLYCERIDE OVER 400 mg/dL 10/31/2016   Lab Results  Component Value Date   TRIG 297 (H) 04/19/2018   TRIG 286 (H) 01/10/2018   TRIG 424 (H) 10/31/2016   Lab Results  Component Value Date   CHOLHDL 5.0 04/19/2018   CHOLHDL 4.0 01/10/2018   CHOLHDL 5.2 10/31/2016   Lab Results  Component Value Date   LDLDIRECT 58 05/29/2019      Allergies  Allergen Reactions  . Demerol  [Meperidine Hcl]   . Demerol [Meperidine] Hives  . Jardiance [Empagliflozin] Other (See Comments)    Perineal pain  . Prednisone Other (See Comments) and Hypertension    Pt states that this medication puts him in A-fib   . Sulfa Antibiotics Hives  . Albuterol Sulfate [Albuterol] Palpitations and Other (See Comments)    Pt currently uses this medication.    . Morphine Sulfate Nausea And Vomiting, Rash and Other (See Comments)    Pt states that he is only allergic to the tablet form of this medication.       Current  Outpatient Medications:  .  Albuterol Sulfate, sensor, 108 (90 Base) MCG/ACT AEPB, albuterol sulf 90 mcg/actuation breath activated powder inhaler,sensor  Inhale 2 puffs every 4 hours by inhalation route., Disp: , Rfl:  .  allopurinol (ZYLOPRIM) 300 MG tablet, Take 1 tablet (300 mg total) by mouth 2 (two) times daily., Disp: 180 tablet, Rfl: 4 .  ALPRAZolam (XANAX) 1 MG tablet, TAKE ONE-HALF TO ONE TABLET BY MOUTH THREE TIMES A DAY AS NEEDED, Disp: 90 tablet, Rfl: 3 .  apixaban (ELIQUIS) 5 MG TABS tablet, Eliquis 5 mg tablet  Take 1 tablet twice a day by oral route., Disp: , Rfl:  .  aspirin 81 MG chewable tablet, Chew 81 mg by mouth daily., Disp: , Rfl:  .  atorvastatin (LIPITOR) 80 MG tablet, TAKE ONE TABLET BY MOUTH AT BEDTIME. (Patient taking differently: Take 80 mg by mouth at bedtime. ), Disp: 90 tablet, Rfl: 4 .  Blood Glucose Monitoring Suppl (ACCU-CHEK AVIVA PLUS) w/Device KIT, CHECK BLOOD SUGAR EVERY DAY, Disp: 1 kit, Rfl: 0 .  cetirizine (ZYRTEC) 10 MG tablet, cetirizine 10 mg tablet  Take 1 tablet every day by oral route at bedtime., Disp: , Rfl:  .  cyclobenzaprine (FLEXERIL) 5 MG tablet, TAKE 1  TO 2 TABLETS BY MOUTH THREE TIMESDAILY AS NEEDED (BACK PAIN), Disp: 60 tablet, Rfl: 5 .  dapagliflozin propanediol (FARXIGA) 5 MG TABS tablet, Take 5 mg by mouth daily., Disp: 90 tablet, Rfl: 4 .  docusate sodium (COLACE) 100 MG capsule, Take 100 mg by mouth daily. , Disp: , Rfl:  .  fluticasone furoate-vilanterol (BREO ELLIPTA) 100-25 MCG/INH AEPB, fluticasone furoate 100 mcg-vilanterol 25 mcg/dose inhalation powder  Inhale 1 puff every day by inhalation route., Disp: , Rfl:  .  glucose blood (ACCU-CHEK AVIVA PLUS) test strip, Use to check blood sugar once a day, Disp: 100 each, Rfl: 4 .  isosorbide mononitrate (IMDUR) 120 MG 24 hr tablet, TAKE 1 TABLET BY MOUTH DAILY, Disp: 30 tablet, Rfl: 5 .  ketoconazole (NIZORAL) 2 % cream, Apply 1 application topically daily., Disp: 15 g, Rfl: 0 .  LINZESS  72 MCG capsule, Take 72 mcg by mouth daily., Disp: , Rfl:  .  lisinopril (PRINIVIL,ZESTRIL) 2.5 MG tablet, TAKE 1 TABLET BY MOUTH DAILY (Patient taking differently: Take 2.5 mg by mouth daily. ), Disp: 30 tablet, Rfl: 11 .  magnesium oxide (MAG-OX) 400 (241.3 Mg) MG tablet, TAKE 1 TABLET BY MOUTH DAILY (Patient taking differently: Take 400 mg by mouth daily. ), Disp: 30 tablet, Rfl: 5 .  magnesium oxide (MAG-OX) 400 MG tablet, magnesium oxide 400 mg (241.3 mg magnesium) tablet  Take 1 tablet every day by oral route., Disp: , Rfl:  .  metFORMIN (GLUCOPHAGE) 500 MG tablet, Take 1 tablet (500 mg total) by mouth 2 (two) times daily with a meal. (Patient not taking: Reported on 05/25/2019), Disp: 60 tablet, Rfl: 11 .  nitroGLYCERIN (NITROSTAT) 0.4 MG SL tablet, Place 1 tablet (0.4 mg total) under the tongue every 5 (five) minutes as needed for chest pain. Reported on 05/26/2016, Disp: 30 tablet, Rfl: 3 .  Omega-3 1000 MG CAPS, omega-3 acid ethyl esters 1 gram capsule  Take 4 capsules every day by oral route., Disp: , Rfl:  .  omega-3 acid ethyl esters (LOVAZA) 1 g capsule, TAKE FOUR CAPSULES BY MOUTH DAILY (Patient taking differently: Take 4 g by mouth daily. ), Disp: 120 capsule, Rfl: 12 .  omeprazole (PRILOSEC) 40 MG capsule, TAKE ONE CAPSULE BY MOUTH TWICE A DAY 30MINUTES PRIOR TO BREAKFAST AND DINNER., Disp: , Rfl:  .  oxyCODONE-acetaminophen (PERCOCET) 10-325 MG tablet, Take 1 tablet by mouth every 4 (four) hours as needed., Disp: 150 tablet, Rfl: 0 .  OXYGEN, oxygen  2L, wear ATC & at night, Disp: , Rfl:  .  sotalol (BETAPACE) 80 MG tablet, Take 80 mg by mouth 2 (two) times daily. , Disp: , Rfl:  .  tamsulosin (FLOMAX) 0.4 MG CAPS capsule, Take 0.4 mg by mouth daily. , Disp: , Rfl:  .  tiotropium (SPIRIVA HANDIHALER) 18 MCG inhalation capsule, Place 1 capsule (18 mcg total) into inhaler and inhale daily., Disp: 30 capsule, Rfl: 12 .  tiZANidine (ZANAFLEX) 4 MG tablet, Take 1 tablet (4 mg total) by mouth  every 6 (six) hours as needed for muscle spasms., Disp: 30 tablet, Rfl: 0 .  torsemide (DEMADEX) 100 MG tablet, Take 0.5 tablets by mouth as needed. , Disp: , Rfl:  .  traZODone (DESYREL) 100 MG tablet, Take 1 tablet (100 mg total) by mouth at bedtime. For sleep, Disp: 30 tablet, Rfl: 0 .  venlafaxine XR (EFFEXOR-XR) 75 MG 24 hr capsule, Take 1 capsule (75 mg total) by mouth daily with breakfast., Disp: 90 capsule, Rfl:  3  Review of Systems  Constitutional: Negative for appetite change, chills and fever.  Respiratory: Negative for chest tightness, shortness of breath and wheezing.   Cardiovascular: Negative for chest pain and palpitations.  Gastrointestinal: Negative for abdominal pain, nausea and vomiting.    Social History   Tobacco Use  . Smoking status: Former Smoker    Quit date: 04/22/2013    Years since quitting: 6.1  . Smokeless tobacco: Never Used  Substance Use Topics  . Alcohol use: No    Comment: remotely quit alcohol use. Hx of heavy alcohol use.      Objective:   BP 136/82 (BP Location: Left Arm, Patient Position: Sitting, Cuff Size: Large)   Pulse 76   Temp 98.1 F (36.7 C) (Oral)   Resp 18   Wt 245 lb 12.8 oz (111.5 kg)   SpO2 92%   BMI 38.50 kg/m     Physical Exam   General Appearance:    Alert, cooperative, no distress  Eyes:    PERRL, conjunctiva/corneas clear, EOM's intact       Lungs:     Clear to auscultation bilaterally, respirations unlabored  Heart:    Normal heart rate. Regular rhythm. No murmurs, rubs, or gallops.   Neurologic:   Awake, alert, oriented x 3. No apparent focal neurological           defect.        Results for orders placed or performed in visit on 05/29/19  POCT HgB A1C  Result Value Ref Range   Hemoglobin A1C 7.9 (A) 4.0 - 5.6 %   HbA1c POC (<> result, manual entry)     HbA1c, POC (prediabetic range)     HbA1c, POC (controlled diabetic range)         Assessment & Plan    1. Type 2 diabetes mellitus with diabetic  nephropathy, without long-term current use of insulin (HCC) Increase from 5 to - dapagliflozin propanediol (FARXIGA) 10 MG TABS tablet; Take 10 mg by mouth daily.  Dispense: 30 tablet; Refill: 12  2. Congestive heart failure, unspecified HF chronicity, unspecified heart failure type [facility]) Doing well on current regiment. Will likely benefit from increased dose of dapgliflozin.   3. Chronic respiratory failure with hypoxia (HCC) Stable. Continue current medications.    4. . Morbid obesity ([place]) Counseled regarding prudent diet and regular exercise.   6. Coronary artery disease involving native coronary artery of native heart with other form of angina pectoris ([place]) Asymptomatic. Compliant with medication.  Continue aggressive risk factor modification.   - Comprehensive metabolic panel - Direct LDL - CBC  refill- methocarbamol (ROBAXIN) 500 MG tablet; Take 1 tablet (500 mg total) by mouth 4 (four) times daily.  Dispense: 60 tablet; Refill: 3  . Future Appointments  Date Time Provider [place]  06/06/2019  2:45 PM [facility] PAT2 [facility] None  06/09/2019 12:00 PM Alisa Graff, FNP [facility] None  07/20/2019  3:30 PM Billey Co, MD [facility] None  09/29/2019  2:40 PM [name], [name], MD [facility] None       [name], MD  [facility]

## 2019-05-29 NOTE — Patient Instructions (Signed)
.   Please review the attached list of medications and notify my office if there are any errors.   . Please bring all of your medications to every appointment so we can make sure that our medication list is the same as yours.   . We will have flu vaccines available after Labor Day. Please go to your pharmacy or call the office in early September to schedule you flu shot.

## 2019-05-30 ENCOUNTER — Telehealth: Payer: Self-pay | Admitting: Radiology

## 2019-05-30 ENCOUNTER — Telehealth: Payer: Self-pay

## 2019-05-30 LAB — LDL CHOLESTEROL, DIRECT: LDL Direct: 58 mg/dL (ref 0–99)

## 2019-05-30 LAB — COMPREHENSIVE METABOLIC PANEL
ALT: 17 IU/L (ref 0–44)
AST: 15 IU/L (ref 0–40)
Albumin/Globulin Ratio: 1.6 (ref 1.2–2.2)
Albumin: 4.1 g/dL (ref 3.8–4.8)
Alkaline Phosphatase: 107 IU/L (ref 39–117)
BUN/Creatinine Ratio: 13 (ref 10–24)
BUN: 12 mg/dL (ref 8–27)
Bilirubin Total: 0.6 mg/dL (ref 0.0–1.2)
CO2: 27 mmol/L (ref 20–29)
Calcium: 9.4 mg/dL (ref 8.6–10.2)
Chloride: 95 mmol/L — ABNORMAL LOW (ref 96–106)
Creatinine, Ser: 0.91 mg/dL (ref 0.76–1.27)
GFR calc Af Amer: 102 mL/min/{1.73_m2} (ref >59)
GFR calc non Af Amer: 88 mL/min/{1.73_m2} (ref >59)
Globulin, Total: 2.5 g/dL (ref 1.5–4.5)
Glucose: 129 mg/dL — ABNORMAL HIGH (ref 65–99)
Potassium: 4.7 mmol/L (ref 3.5–5.2)
Sodium: 140 mmol/L (ref 134–144)
Total Protein: 6.6 g/dL (ref 6.0–8.5)

## 2019-05-30 LAB — CBC
Hematocrit: 49.7 % (ref 37.5–51.0)
Hemoglobin: 16.2 g/dL (ref 13.0–17.7)
MCH: 26.7 pg (ref 26.6–33.0)
MCHC: 32.6 g/dL (ref 31.5–35.7)
MCV: 82 fL (ref 79–97)
Platelets: 184 10*3/uL (ref 150–450)
RBC: 6.07 x10E6/uL — ABNORMAL HIGH (ref 4.14–5.80)
RDW: 16 % — ABNORMAL HIGH (ref 11.6–15.4)
WBC: 6.1 10*3/uL (ref 3.4–10.8)

## 2019-05-30 NOTE — Telephone Encounter (Signed)
-----   Message from Nicut, RN sent at 05/30/2019 10:54 AM EDT ----- Does the urine culture need to be repeated before surgery on 06/09/2019?

## 2019-05-30 NOTE — Telephone Encounter (Signed)
-----   Message from Birdie Sons, MD sent at 05/30/2019 12:32 PM EDT ----- Labs are all very good. Continue current medications.  Follow up in November as scheduled.

## 2019-05-30 NOTE — Telephone Encounter (Signed)
Left message for patient to call back regarding labs.

## 2019-05-30 NOTE — Telephone Encounter (Signed)
No, thanks  Nickolas Madrid, MD 05/30/2019

## 2019-06-01 ENCOUNTER — Telehealth: Payer: Self-pay | Admitting: Family Medicine

## 2019-06-01 NOTE — Telephone Encounter (Signed)
Pt was informed of labs  teri

## 2019-06-05 ENCOUNTER — Other Ambulatory Visit
Admission: RE | Admit: 2019-06-05 | Discharge: 2019-06-05 | Disposition: A | Discharge status: Home / Self Care | Payer: Medicare HMO | Source: Ambulatory Visit | Attending: Urology | Admitting: Urology

## 2019-06-05 ENCOUNTER — Other Ambulatory Visit: Payer: Self-pay

## 2019-06-05 DIAGNOSIS — Z1159 Encounter for screening for other viral diseases: Secondary | ICD-10-CM | POA: Diagnosis not present

## 2019-06-06 ENCOUNTER — Encounter
Admission: RE | Admit: 2019-06-06 | Discharge: 2019-06-06 | Disposition: A | Discharge status: Home / Self Care | Payer: Medicare HMO | Source: Ambulatory Visit | Attending: Urology | Admitting: Urology

## 2019-06-06 HISTORY — DX: Sleep apnea, unspecified: G47.30

## 2019-06-06 HISTORY — DX: Personal history of urinary calculi: Z87.442

## 2019-06-06 HISTORY — DX: Malignant (primary) neoplasm, unspecified: C80.1

## 2019-06-06 HISTORY — DX: Other intervertebral disc degeneration, lumbar region without mention of lumbar back pain or lower extremity pain: M51.369

## 2019-06-06 HISTORY — DX: Personal history of other diseases of the digestive system: Z87.19

## 2019-06-06 HISTORY — DX: Other intervertebral disc degeneration, lumbar region: M51.36

## 2019-06-06 LAB — SARS CORONAVIRUS 2 (TAT 6-24 HRS): SARS Coronavirus 2: NEGATIVE

## 2019-06-06 NOTE — Pre-Procedure Instructions (Signed)
EKG  EKG viewed and interpreted by myself shows a normal sinus rhythm at 73 bpm with a narrow QRS, normal axis, normal intervals, no concerning ST changes.  ____________________________________________    RADIOLOGY  X-ray consistent with bronchitis or reactive airway disease.  ____________________________________________   INITIAL IMPRESSION / ASSESSMENT AND PLAN / ED COURSE  Pertinent labs & imaging results that were available during my care of the patient were reviewed by me and considered in my medical decision making (see chart for details).  Patient presents to the emergency department for shortness of breath worsening over the past 1 week with intermittent chest discomfort.  Patient states he has been coughing more than normal.  Patient wears 3 L of oxygen 24/7 for COPD however did not bring it with him to the emergency department.  Currently on room air satting 98% with a good waveform.  Patient has very minimal expiratory wheeze on exam.  We will treat with duo nebs and Solu-Medrol.  We will check labs including cardiac enzymes.  We will obtain a chest x-ray and continue to closely monitor.  Differential at this time would include COPD exacerbation, pneumonia, URI, bronchitis, ACS.  X-ray most consistent bronchitis/reactive airway disease.  No pneumonia.  Lab work largely reassuring.  We will discharge on prednisone taper, Zithromax.  Patient has breathing treatments at home.  Patient has chronic pain medication at home.  Patient has not taken his chronic pain medication since lunchtime we will dose to Percocet prior to discharge.  Patient agreeable to plan of care.   ____________________________________________   FINAL CLINICAL IMPRESSION(S) / ED DIAGNOSES  COPD exacerbation Acute bronchitis   Harvest Dark, MD 12/11/18 1859         Electronically signed by Harvest Dark, MD at 12/11/2018 6:59 PM   ED on 12/11/2018     Detailed Report

## 2019-06-06 NOTE — Patient Instructions (Signed)
Your procedure is scheduled on: 06-09-19 FRIDAY Report to Same Day Surgery 2nd floor medical mall [facility] Entrance-take elevator on left to 2nd floor.  Check in with surgery information desk.) To find out your arrival time please call [phone] between 1PM - 3PM on 06-08-19 THURSDAY  Remember: Instructions that are not followed completely may result in serious medical risk, up to and including death, or upon the discretion of your surgeon and anesthesiologist your surgery may need to be rescheduled.    _x___ 1. Do not eat food after midnight the night before your procedure. NO GUM OR CANDY AFTER MIDNIGHT.  You may drink WATER up to 2 hours before you are scheduled to arrive at the hospital for your procedure.  Do not drink WATER within 2 hours of your scheduled arrival to the hospital.  Type 1 and type 2 diabetics should only drink water.   ____Ensure clear carbohydrate drink on the way to the hospital for bariatric patients  ____Ensure clear carbohydrate drink 3 hours before surgery.     __x__ 2. No Alcohol for 24 hours before or after surgery.   __x__3. No Smoking or e-cigarettes for 24 prior to surgery.  Do not use any chewable tobacco products for at least 6 hour prior to surgery   ____  4. Bring all medications with you on the day of surgery if instructed.    __x__ 5. Notify your doctor if there is any change in your medical condition     (cold, fever, infections).    x___6. On the morning of surgery brush your teeth with toothpaste and water.  You may rinse your mouth with mouth wash if you wish.  Do not swallow any toothpaste or mouthwash.   Do not wear jewelry, make-up, hairpins, clips or nail polish.  Do not wear lotions, powders, or perfumes. You may wear deodorant.  Do not shave 48 hours prior to surgery. Men may shave face and neck.  Do not bring valuables to the hospital.    [facility] is not responsible for any belongings or valuables.    Contacts, dentures or bridgework may not be worn into surgery.  Leave your suitcase in the car. After surgery it may be brought to your room.  For patients admitted to the hospital, discharge time is determined by your treatment team.  _  Patients discharged the day of surgery will not be allowed to drive home.  You will need someone to drive you home and stay with you the night of your procedure.    Please read over the following fact sheets that you were given:   [facility] Preparing for Surgery   _x___ TAKE THE FOLLOWING MEDICATION THE MORNING OF SURGERY WITH A SMALL SIP OF WATER. These include:  1. IMDUR (ISOSORBIDE)  2. ALLOPURINOL (ZYLOPRIM)  3. XANAX (ALPRAZOLAM)  4. PERCOCET (OXYCODONE/TYLENOL)   5. EFFEXOR (VENLAFAXINE)  6. MAGNESIUM OXIDE  7. PRILOSEC (OMEPRAZOLE)  ____Fleets enema or Magnesium Citrate as directed.   ____ Use CHG Soap or sage wipes as directed on instruction sheet   _X___ Use inhalers on the day of surgery and bring to hospital day of surgery-USE YOUR BREO-ELLIPTA, SPIRIVA AND ALBUTEROL INHALER AM OF SURGERY AND BRING ALBUTEROL [place]  ____ Stop Metformin and Janumet 2 days prior to surgery.    ____ Take 1/2 of usual insulin dose the night before surgery and none on the morning surgery.   ____ Follow recommendations from Cardiologist, Pulmonologist or PCP regarding  stopping Aspirin, Coumadin, Plavix ,Eliquis, Effient, or Pradaxa, and Pletal-PT STOPPED ASPIRIN ON 7-17 AND STOPPED ELIQUIS 06-05-19  X____Stop Anti-inflammatories such as Advil, Aleve, Ibuprofen, Motrin, Naproxen, Naprosyn, Goodies powders or aspirin products. OK to take Tylenol   _x___ Stop supplements until after surgery-STOP OMEGA 3 NOW-MAY RESUME AFTER SURGERY   ____ Bring C-Pap to the hospital.

## 2019-06-07 ENCOUNTER — Other Ambulatory Visit: Payer: Medicare HMO

## 2019-06-08 MED ORDER — CEFAZOLIN SODIUM-DEXTROSE 2-4 GM/100ML-% IV SOLN
2.0000 g | INTRAVENOUS | Status: AC
  Administered 2019-06-09: 2 g via INTRAVENOUS

## 2019-06-08 NOTE — Pre-Procedure Instructions (Signed)
Cardiac clearance on chart from dr [name]

## 2019-06-09 ENCOUNTER — Ambulatory Visit: Payer: Medicare HMO | Admitting: Anesthesiology

## 2019-06-09 ENCOUNTER — Ambulatory Visit: Payer: Medicare HMO | Admitting: Family

## 2019-06-09 ENCOUNTER — Ambulatory Visit
Admission: RE | Admit: 2019-06-09 | Discharge: 2019-06-09 | Disposition: A | Discharge status: Home / Self Care | Payer: Medicare HMO | Attending: Urology | Admitting: Urology

## 2019-06-09 ENCOUNTER — Encounter
Admission: RE | Disposition: A | Discharge status: Home / Self Care | Payer: Self-pay | Source: Home / Self Care | Attending: Urology

## 2019-06-09 ENCOUNTER — Encounter: Payer: Self-pay | Admitting: *Deleted

## 2019-06-09 DIAGNOSIS — N471 Phimosis: Secondary | ICD-10-CM | POA: Diagnosis not present

## 2019-06-09 DIAGNOSIS — K219 Gastro-esophageal reflux disease without esophagitis: Secondary | ICD-10-CM | POA: Diagnosis not present

## 2019-06-09 DIAGNOSIS — Z951 Presence of aortocoronary bypass graft: Secondary | ICD-10-CM | POA: Insufficient documentation

## 2019-06-09 DIAGNOSIS — I471 Supraventricular tachycardia: Secondary | ICD-10-CM | POA: Diagnosis not present

## 2019-06-09 DIAGNOSIS — N472 Paraphimosis: Secondary | ICD-10-CM

## 2019-06-09 DIAGNOSIS — I5032 Chronic diastolic (congestive) heart failure: Secondary | ICD-10-CM | POA: Diagnosis not present

## 2019-06-09 DIAGNOSIS — I4891 Unspecified atrial fibrillation: Secondary | ICD-10-CM | POA: Insufficient documentation

## 2019-06-09 DIAGNOSIS — Z7901 Long term (current) use of anticoagulants: Secondary | ICD-10-CM | POA: Diagnosis not present

## 2019-06-09 DIAGNOSIS — Z79899 Other long term (current) drug therapy: Secondary | ICD-10-CM | POA: Diagnosis not present

## 2019-06-09 DIAGNOSIS — J449 Chronic obstructive pulmonary disease, unspecified: Secondary | ICD-10-CM | POA: Insufficient documentation

## 2019-06-09 DIAGNOSIS — I130 Hypertensive heart and chronic kidney disease with heart failure and stage 1 through stage 4 chronic kidney disease, or unspecified chronic kidney disease: Secondary | ICD-10-CM | POA: Diagnosis not present

## 2019-06-09 DIAGNOSIS — I2510 Atherosclerotic heart disease of native coronary artery without angina pectoris: Secondary | ICD-10-CM | POA: Insufficient documentation

## 2019-06-09 DIAGNOSIS — Z7982 Long term (current) use of aspirin: Secondary | ICD-10-CM | POA: Insufficient documentation

## 2019-06-09 DIAGNOSIS — Z87891 Personal history of nicotine dependence: Secondary | ICD-10-CM | POA: Insufficient documentation

## 2019-06-09 DIAGNOSIS — N182 Chronic kidney disease, stage 2 (mild): Secondary | ICD-10-CM | POA: Diagnosis not present

## 2019-06-09 DIAGNOSIS — I5033 Acute on chronic diastolic (congestive) heart failure: Secondary | ICD-10-CM | POA: Diagnosis not present

## 2019-06-09 DIAGNOSIS — Z9981 Dependence on supplemental oxygen: Secondary | ICD-10-CM | POA: Insufficient documentation

## 2019-06-09 DIAGNOSIS — E119 Type 2 diabetes mellitus without complications: Secondary | ICD-10-CM | POA: Diagnosis not present

## 2019-06-09 DIAGNOSIS — I110 Hypertensive heart disease with heart failure: Secondary | ICD-10-CM | POA: Insufficient documentation

## 2019-06-09 DIAGNOSIS — Z955 Presence of coronary angioplasty implant and graft: Secondary | ICD-10-CM | POA: Diagnosis not present

## 2019-06-09 DIAGNOSIS — E1122 Type 2 diabetes mellitus with diabetic chronic kidney disease: Secondary | ICD-10-CM | POA: Diagnosis not present

## 2019-06-09 HISTORY — PX: CIRCUMCISION: SHX1350

## 2019-06-09 LAB — GLUCOSE, CAPILLARY
Glucose-Capillary: 215 mg/dL — ABNORMAL HIGH (ref 70–99)
Glucose-Capillary: 222 mg/dL — ABNORMAL HIGH (ref 70–99)

## 2019-06-09 SURGERY — CIRCUMCISION, ADULT
Anesthesia: General

## 2019-06-09 MED ORDER — BUPIVACAINE HCL (PF) 0.5 % IJ SOLN
INTRAMUSCULAR | Status: AC
  Filled 2019-06-09: qty 30

## 2019-06-09 MED ORDER — OXYCODONE-ACETAMINOPHEN 10-325 MG PO TABS: 1.0000 | ORAL_TABLET | Freq: Four times a day (QID) | ORAL | 0 refills | Status: DC | PRN

## 2019-06-09 MED ORDER — BACITRACIN ZINC 500 UNIT/GM EX OINT
TOPICAL_OINTMENT | CUTANEOUS | Status: AC
  Filled 2019-06-09: qty 28.35

## 2019-06-09 MED ORDER — ONDANSETRON HCL 4 MG/2ML IJ SOLN
INTRAMUSCULAR | Status: AC
  Filled 2019-06-09: qty 2

## 2019-06-09 MED ORDER — SODIUM CHLORIDE (PF) 0.9 % IJ SOLN
INTRAMUSCULAR | Status: AC
  Filled 2019-06-09: qty 20

## 2019-06-09 MED ORDER — EPHEDRINE SULFATE 50 MG/ML IJ SOLN
INTRAMUSCULAR | Status: AC
  Filled 2019-06-09: qty 1

## 2019-06-09 MED ORDER — LIDOCAINE HCL (CARDIAC) PF 100 MG/5ML IV SOSY
PREFILLED_SYRINGE | INTRAVENOUS | Status: DC | PRN
  Administered 2019-06-09: 80 mg via INTRAVENOUS

## 2019-06-09 MED ORDER — LIDOCAINE HCL (PF) 2 % IJ SOLN
INTRAMUSCULAR | Status: AC
  Filled 2019-06-09: qty 10

## 2019-06-09 MED ORDER — PHENYLEPHRINE HCL (PRESSORS) 10 MG/ML IV SOLN
INTRAVENOUS | Status: DC | PRN
  Administered 2019-06-09 (×3): 100 ug via INTRAVENOUS

## 2019-06-09 MED ORDER — BUPIVACAINE HCL (PF) 0.25 % IJ SOLN
INTRAMUSCULAR | Status: AC
  Filled 2019-06-09: qty 30

## 2019-06-09 MED ORDER — PHENYLEPHRINE HCL (PRESSORS) 10 MG/ML IV SOLN
INTRAVENOUS | Status: AC
  Filled 2019-06-09: qty 1

## 2019-06-09 MED ORDER — ACETAMINOPHEN 160 MG/5ML PO SOLN
325.0000 mg | ORAL | Status: DC | PRN
  Filled 2019-06-09: qty 20.3

## 2019-06-09 MED ORDER — OXYCODONE-ACETAMINOPHEN 5-325 MG PO TABS
2.0000 | ORAL_TABLET | Freq: Once | ORAL | Status: AC
  Administered 2019-06-09: 2 via ORAL

## 2019-06-09 MED ORDER — MIDAZOLAM HCL 2 MG/2ML IJ SOLN
INTRAMUSCULAR | Status: AC
  Filled 2019-06-09: qty 2

## 2019-06-09 MED ORDER — ACETAMINOPHEN 325 MG PO TABS: 325.0000 mg | ORAL_TABLET | ORAL | Status: DC | PRN

## 2019-06-09 MED ORDER — FENTANYL CITRATE (PF) 100 MCG/2ML IJ SOLN
INTRAMUSCULAR | Status: AC
  Filled 2019-06-09: qty 2

## 2019-06-09 MED ORDER — BUPIVACAINE HCL (PF) 0.5 % IJ SOLN
INTRAMUSCULAR | Status: DC | PRN
  Administered 2019-06-09: 20 mL

## 2019-06-09 MED ORDER — BACITRACIN 500 UNIT/GM EX OINT
TOPICAL_OINTMENT | CUTANEOUS | Status: DC | PRN
  Administered 2019-06-09: 1 via TOPICAL

## 2019-06-09 MED ORDER — FENTANYL CITRATE (PF) 100 MCG/2ML IJ SOLN
INTRAMUSCULAR | Status: DC | PRN
  Administered 2019-06-09: 50 ug via INTRAVENOUS

## 2019-06-09 MED ORDER — SODIUM CHLORIDE 0.9 % IV SOLN
INTRAVENOUS | Status: DC
  Administered 2019-06-09: 07:00:00 via INTRAVENOUS

## 2019-06-09 MED ORDER — PROMETHAZINE HCL 25 MG/ML IJ SOLN: 6.2500 mg | INTRAMUSCULAR | Status: DC | PRN

## 2019-06-09 MED ORDER — SUCCINYLCHOLINE CHLORIDE 20 MG/ML IJ SOLN
INTRAMUSCULAR | Status: AC
  Filled 2019-06-09: qty 1

## 2019-06-09 MED ORDER — PROPOFOL 10 MG/ML IV BOLUS
INTRAVENOUS | Status: DC | PRN
  Administered 2019-06-09: 150 mg via INTRAVENOUS

## 2019-06-09 MED ORDER — OXYCODONE-ACETAMINOPHEN 5-325 MG PO TABS
ORAL_TABLET | ORAL | Status: AC
  Filled 2019-06-09: qty 2

## 2019-06-09 MED ORDER — FENTANYL CITRATE (PF) 100 MCG/2ML IJ SOLN
25.0000 ug | INTRAMUSCULAR | Status: DC | PRN
  Administered 2019-06-09 (×3): 50 ug via INTRAVENOUS

## 2019-06-09 MED ORDER — PROPOFOL 10 MG/ML IV BOLUS
INTRAVENOUS | Status: AC
  Filled 2019-06-09: qty 20

## 2019-06-09 MED ORDER — BACITRACIN 500 UNIT/GM OP OINT
TOPICAL_OINTMENT | OPHTHALMIC | Status: AC
  Filled 2019-06-09: qty 3.5

## 2019-06-09 MED ORDER — DEXAMETHASONE SODIUM PHOSPHATE 10 MG/ML IJ SOLN
INTRAMUSCULAR | Status: AC
  Filled 2019-06-09: qty 1

## 2019-06-09 MED ORDER — ONDANSETRON HCL 4 MG/2ML IJ SOLN
INTRAMUSCULAR | Status: DC | PRN
  Administered 2019-06-09: 4 mg via INTRAVENOUS

## 2019-06-09 MED ORDER — CEFAZOLIN SODIUM-DEXTROSE 2-4 GM/100ML-% IV SOLN
INTRAVENOUS | Status: AC
  Filled 2019-06-09: qty 100

## 2019-06-09 SURGICAL SUPPLY — 33 items
BLADE CLIPPER SURG (BLADE) ×1 IMPLANT
BLADE SURG 15 STRL LF DISP TIS (BLADE) ×1 IMPLANT
BLADE SURG 15 STRL SS (BLADE) ×2
BNDG COHESIVE 1X5 TAN NS LF (GAUZE/BANDAGES/DRESSINGS) ×2 IMPLANT
BNDG CONFORM 2 STRL LF (GAUZE/BANDAGES/DRESSINGS) ×3 IMPLANT
CANISTER SUCT 1200ML W/VALVE (MISCELLANEOUS) ×1 IMPLANT
CHLORAPREP W/TINT 26 (MISCELLANEOUS) ×3 IMPLANT
COVER WAND RF STERILE (DRAPES) ×1 IMPLANT
DRAPE LAPAROTOMY 77X122 PED (DRAPES) ×3 IMPLANT
ELECT BIVAP BIPO 22/24 DONUT (ELECTROSURGICAL) ×3
ELECT REM PT RETURN 9FT ADLT (ELECTROSURGICAL) ×3
ELECTRD BIVAP BIPO 22/24 DONUT (ELECTROSURGICAL) IMPLANT
ELECTRODE REM PT RTRN 9FT ADLT (ELECTROSURGICAL) ×1 IMPLANT
GAUZE PETROLATUM 1 X8 (GAUZE/BANDAGES/DRESSINGS) ×3 IMPLANT
GLOVE BIOGEL PI IND STRL 7.5 (GLOVE) ×2 IMPLANT
GLOVE BIOGEL PI INDICATOR 7.5 (GLOVE) ×4
GOWN STRL REUS W/ TWL LRG LVL3 (GOWN DISPOSABLE) ×1 IMPLANT
GOWN STRL REUS W/ TWL XL LVL3 (GOWN DISPOSABLE) ×1 IMPLANT
GOWN STRL REUS W/TWL LRG LVL3 (GOWN DISPOSABLE) ×2
GOWN STRL REUS W/TWL XL LVL3 (GOWN DISPOSABLE) ×2
KIT TURNOVER KIT A (KITS) ×3 IMPLANT
LABEL OR SOLS (LABEL) ×1 IMPLANT
NDL HYPO 25X1 1.5 SAFETY (NEEDLE) ×1 IMPLANT
NEEDLE HYPO 25X1 1.5 SAFETY (NEEDLE) ×3 IMPLANT
NS IRRIG 500ML POUR BTL (IV SOLUTION) ×3 IMPLANT
PACK BASIN MINOR ARMC (MISCELLANEOUS) ×3 IMPLANT
SOL PREP PVP 2OZ (MISCELLANEOUS) ×3
SOLUTION PREP PVP 2OZ (MISCELLANEOUS) ×1 IMPLANT
SURGILUBE 2OZ TUBE FLIPTOP (MISCELLANEOUS) ×1 IMPLANT
SUT CHROMIC 3 0 SH 27 (SUTURE) ×3 IMPLANT
SUT VIC AB 3-0 SH 27 (SUTURE) ×2
SUT VIC AB 3-0 SH 27X BRD (SUTURE) ×1 IMPLANT
SYR 10ML LL (SYRINGE) ×3 IMPLANT

## 2019-06-09 NOTE — Op Note (Signed)
Date of procedure: 06/09/19  Preoperative diagnosis:  1. Phimosis   Postoperative diagnosis:  1. Same   Procedure: 1. Dorsal slit of penis  Surgeon: Nickolas Madrid, MD  Anesthesia: General  Complications: None  Intraoperative findings:  1.  Significant phimosis, uncomplicated dorsal slit  EBL: Minimal  Specimens: None  Drains: None  Indication: Lawrence [name] is a 63 y.o. patient with phimosis and history of multiple ER visits for paraphimosis that required dorsal blocking reduction.  With his extensive comorbidities, we elected for a dorsal slit to minimize risk of postoperative complications.  After reviewing the management options for treatment, they elected to proceed with the above surgical procedure(s). We have discussed the potential benefits and risks of the procedure, side effects of the proposed treatment, the likelihood of the patient achieving the goals of the procedure, and any potential problems that might occur during the procedure or recuperation. Informed consent has been obtained.  Description of procedure:  The patient was taken to the operating room and general anesthesia was induced. SCDs were placed for DVT prophylaxis.. The patient was placed in the supine position, prepped and draped in the usual sterile fashion, and preoperative antibiotics(Ancef) were administered. A preoperative time-out was performed.   A dorsal block was performed with half percent Marcaine, and a penile ring block was performed.  The penis and glans were cleaned extensively with Betadine, and I was unable to completely pull back his phimosis.  Gloves were changed.  I then placed a straight clamp on the dorsal aspect of the foreskin for the dorsal slit.  The clamp was removed, and a 3 cm incision with Metzenbaums scissors was made dorsally, and the glans exposed.  Excellent hemostasis was achieved at the edges of the wound.  A 3-0 chromic was used to close both edges in running fashion.   Betadine was used again to clean the glans and foreskin extensively.  Bacitracin was applied.  A sterile dressing of gauze and Coban and was gently applied.  Disposition: Stable to PACU  Plan: Follow-up in 1 month in clinic for wound check Okay to resume Eliquis on Monday 7/27  Nickolas Madrid, MD

## 2019-06-09 NOTE — Transfer of Care (Signed)
Immediate Anesthesia Transfer of Care Note  Patient: Lawrence [name]  Procedure(s) Performed: CIRCUMCISION ADULT (N/A )  Patient Location: PACU  Anesthesia Type:General  Level of Consciousness: awake  Airway & Oxygen Therapy: Patient connected to nasal cannula oxygen  Post-op Assessment: Report given to RN  Post vital signs: stable  Last Vitals:  Vitals Value Taken Time  BP 133/103 06/09/19 0817  Temp 36.7 C 06/09/19 0816  Pulse 91 06/09/19 0821  Resp 17 06/09/19 0821  SpO2 96 % 06/09/19 0821  Vitals shown include unvalidated device data.  Last Pain:  Vitals:   06/09/19 0614  TempSrc: Tympanic         Complications: No apparent anesthesia complications

## 2019-06-09 NOTE — Anesthesia Preprocedure Evaluation (Signed)
Anesthesia Evaluation  Patient identified by MRN, date of birth, ID band Patient awake    Reviewed: Allergy & Precautions, H&P , NPO status , reviewed documented beta blocker date and time   Airway Mallampati: II   Neck ROM: limited    Dental  (+) Edentulous Upper, Edentulous Lower   Pulmonary shortness of breath, asthma , sleep apnea , COPD,  COPD inhaler and oxygen dependent, former smoker,     + decreased breath sounds      Cardiovascular hypertension, + angina + CAD, + Past MI, + CABG and +CHF  Normal cardiovascular exam+ dysrhythmias   2019 ECHO Study Conclusions  - Procedure narrative: Transthoracic echocardiography. The study   was technically difficult. - Left ventricle: The cavity size was mildly dilated. Systolic   function was normal. The estimated ejection fraction was in the   range of 50% to 55%. Akinesis of the apical myocardium. - Aortic valve: Valve area (Vmax): 2.99 cm^2.   Neuro/Psych PSYCHIATRIC DISORDERS Anxiety  Neuromuscular disease    GI/Hepatic hiatal hernia, GERD  ,  Endo/Other  diabetes  Renal/GU Renal disease     Musculoskeletal  (+) Arthritis ,   Abdominal   Peds  Hematology  (+) Blood dyscrasia, anemia ,   Anesthesia Other Findings Past Medical History: No date: A-fib ([place]) No date: Anemia No date: Anginal pain ([place]) No date: Anxiety No date: Arthritis No date: Asthma No date: CAD (coronary artery disease)     Comment:  a. 2002 CABGx2 (LIMA->LAD, VG->VG->OM1);  b. 09/2012               DES->OM;  c. 03/2015 PTCA of LAD [facility]) in setting of               atretic LIMA; d. 05/2015 Cath [facility]): nonobs dzs; e. 06/2015              Cath ([facility]): LM nl, LAD 45p/d ISR, 50d, D1/2 small, LCX               50p/d ISR, OM1 70ost, 30 ISR, VG->OM1 50ost, 60m               LIMA->LAD 99p/d - atretic, RCA dom, nl; f.cath 10/16:               40-50%(FFR 0.90) pLAD, 75% (FFR 0.77) mLAD s/p PCI/DES,              oRCA 40% (FFR0.95) No date: Cancer ([place]     Comment:  SKIN CANCER ON BACK No date: Celiac disease No date: Chronic diastolic CHF (congestive heart failure) ([place]     Comment:  a. 06/2009 Echo: EF 60-65%, Gr 1 DD, triv AI, mildly dil               LA, nl RV. No date: COPD (chronic obstructive pulmonary disease) ([facility])     Comment:  a. Chronic bronchitis and emphysema. No date: DDD (degenerative disc disease), lumbar No date: Diverticulosis No date: Dysrhythmia No date: Essential hypertension No date: GERD (gastroesophageal reflux disease) No date: History of hiatal hernia No date: History of kidney stones     Comment:  H/O No date: History of tobacco abuse     Comment:  a. Quit 2014. 2002: Myocardial infarction ([place]     Comment:  4 STENTS No date: Pancreatitis No date: PSVT (paroxysmal supraventricular tachycardia) ([place]     Comment:  a. 10/2012 Noted on Zio Patch. No date: Sleep apnea  Comment:  LOST CORD TO CPAP -ONLY 02 @ BEDTIME No date: Tubular adenoma of colon No date: Type II diabetes mellitus ([place]) Past Surgical History: No date: BYPASS GRAFT 07/12/2015: CARDIAC CATHETERIZATION; N/A     Comment:  rocedure: Left Heart Cath and Cors/Grafts Angiography;                Surgeon: Belva Crome, MD;  Location: [place] [facility]               LAB;  Service: Cardiovascular;  Laterality: N/A; 10/07/2015: CARDIAC CATHETERIZATION; Right     Comment:  Procedure: Left Heart Cath and Cors/Grafts Angiography;               Surgeon: Dionisio David, MD;  Location: [place] [facility]               LAB;  Service: Cardiovascular;  Laterality: Right; 04/06/2016: CARDIAC CATHETERIZATION; N/A     Comment:  Procedure: Left Heart Cath and Coronary Angiography;                Surgeon: Yolonda Kida, MD;  Location: [place]               CV LAB;  Service: Cardiovascular;  Laterality: N/A; 04/06/2016: CARDIAC CATHETERIZATION     Comment:  Procedure: Bypass Graft Angiography;   Surgeon: Yolonda Kida, MD;  Location: [place] CV LAB;  Service:               Cardiovascular;; 11/02/2016: CARDIAC CATHETERIZATION; N/A     Comment:  Procedure: Left Heart Cath and Cors/Grafts Angiography               and possible PCI;  Surgeon: Yolonda Kida, MD;                Location: [place] CV LAB;  Service: Cardiovascular;              Laterality: N/A; 11/02/2016: CARDIAC CATHETERIZATION; N/A     Comment:  Procedure: Coronary Stent Intervention;  Surgeon: Yolonda Kida, MD;  Location: [place] CV LAB;                Service: Cardiovascular;  Laterality: N/A; No date: CHOLECYSTECTOMY 04/01/2018: COLONOSCOPY WITH PROPOFOL; N/A     Comment:  Procedure: COLONOSCOPY WITH PROPOFOL;  Surgeon: Manya Silvas, MD;  Location: [facility] ENDOSCOPY;  Service:               Endoscopy;  Laterality: N/A; No date: ESOPHAGEAL DILATION 04/01/2018: ESOPHAGOGASTRODUODENOSCOPY (EGD) WITH PROPOFOL; N/A     Comment:  Procedure: ESOPHAGOGASTRODUODENOSCOPY (EGD) WITH               PROPOFOL;  Surgeon: Manya Silvas, MD;  Location:               [facility] ENDOSCOPY;  Service: Endoscopy;  Laterality: N/A; No date: TONSILLECTOMY No date: VASCULAR SURGERY   Reproductive/Obstetrics                             Anesthesia Physical Anesthesia Plan  ASA: III  Anesthesia Plan: General   Post-op Pain Management:    Induction: Intravenous  PONV Risk  Score and Plan: Ondansetron and Treatment may vary due to age or medical condition  Airway Management Planned: LMA  Additional Equipment:   Intra-op Plan:   Post-operative Plan: Extubation in OR  Informed Consent: I have reviewed the patients History and Physical, chart, labs and discussed the procedure including the risks, benefits and alternatives for the proposed anesthesia with the patient or authorized representative who has indicated his/her understanding and  acceptance.     Dental Advisory Given  Plan Discussed with: CRNA  Anesthesia Plan Comments:         Anesthesia Quick Evaluation

## 2019-06-09 NOTE — Anesthesia Postprocedure Evaluation (Signed)
Anesthesia Post Note  Patient: Lawrence [name]  Procedure(s) Performed: CIRCUMCISION ADULT (N/A )  Patient location during evaluation: PACU Anesthesia Type: General Level of consciousness: awake and alert Pain management: pain level controlled Vital Signs Assessment: post-procedure vital signs reviewed and stable Respiratory status: spontaneous breathing, nonlabored ventilation, respiratory function stable and patient connected to nasal cannula oxygen Cardiovascular status: blood pressure returned to baseline and stable Postop Assessment: no apparent nausea or vomiting Anesthetic complications: no     Last Vitals:  Vitals:   06/09/19 0907 06/09/19 1120  BP: 123/72 117/66  Pulse: 75 74  Resp: 20 18  Temp: (!) 36.2 C   SpO2: 97% 99%    Last Pain:  Vitals:   06/09/19 1120  TempSrc:   PainSc: 3                  Kamyia Thomason Harvie Heck

## 2019-06-09 NOTE — Discharge Instructions (Signed)

## 2019-06-09 NOTE — Anesthesia Post-op Follow-up Note (Signed)
Anesthesia QCDR form completed.        

## 2019-06-09 NOTE — Anesthesia Procedure Notes (Signed)
Procedure Name: LMA Insertion Date/Time: 06/09/2019 7:56 AM Performed by: Zetta Bills, CRNA Pre-anesthesia Checklist: Patient identified, Emergency Drugs available, Suction available and Patient being monitored Patient Re-evaluated:Patient Re-evaluated prior to induction Oxygen Delivery Method: Circle system utilized Preoxygenation: Pre-oxygenation with 100% oxygen Induction Type: IV induction LMA: LMA inserted LMA Size: 4.0 Number of attempts: 1 Tube secured with: Tape Dental Injury: Teeth and Oropharynx as per pre-operative assessment

## 2019-06-09 NOTE — H&P (Signed)
UROLOGY H&P UPDATE  Agree with prior H&P dated 7/9. 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 We discussed the differences between circumcision and dorsal slit. He is not sexually active. He would like to proceed with dorsal slit to minimize post-op risks of bleeding and infection.  Cardiac: Irregularly irregular  Lungs: CTA bilaterally  Laterality: N/A Procedure: Dorsal slit  Urine: culture 7/9 mixed flora 10k  Informed consent obtained, we specifically discussed the risks of bleeding, infection, post-operative pain, need for additional procedures,and complications with wound healing.  Billey Co, MD 06/09/2019

## 2019-06-10 ENCOUNTER — Other Ambulatory Visit: Payer: Self-pay

## 2019-06-10 ENCOUNTER — Encounter: Payer: Self-pay | Admitting: Urology

## 2019-06-10 ENCOUNTER — Observation Stay
Admission: EM | Admit: 2019-06-10 | Discharge: 2019-06-12 | Disposition: A | Discharge status: Home / Self Care | Payer: Medicare HMO | Attending: Internal Medicine | Admitting: Internal Medicine

## 2019-06-10 ENCOUNTER — Emergency Department: Payer: Medicare HMO

## 2019-06-10 DIAGNOSIS — I110 Hypertensive heart disease with heart failure: Secondary | ICD-10-CM | POA: Insufficient documentation

## 2019-06-10 DIAGNOSIS — I2510 Atherosclerotic heart disease of native coronary artery without angina pectoris: Secondary | ICD-10-CM | POA: Diagnosis present

## 2019-06-10 DIAGNOSIS — I200 Unstable angina: Secondary | ICD-10-CM

## 2019-06-10 DIAGNOSIS — I5032 Chronic diastolic (congestive) heart failure: Secondary | ICD-10-CM | POA: Diagnosis present

## 2019-06-10 DIAGNOSIS — Z885 Allergy status to narcotic agent status: Secondary | ICD-10-CM | POA: Insufficient documentation

## 2019-06-10 DIAGNOSIS — K219 Gastro-esophageal reflux disease without esophagitis: Secondary | ICD-10-CM | POA: Diagnosis present

## 2019-06-10 DIAGNOSIS — I4891 Unspecified atrial fibrillation: Secondary | ICD-10-CM | POA: Diagnosis present

## 2019-06-10 DIAGNOSIS — I25110 Atherosclerotic heart disease of native coronary artery with unstable angina pectoris: Secondary | ICD-10-CM | POA: Diagnosis not present

## 2019-06-10 DIAGNOSIS — E119 Type 2 diabetes mellitus without complications: Secondary | ICD-10-CM | POA: Diagnosis not present

## 2019-06-10 DIAGNOSIS — Z955 Presence of coronary angioplasty implant and graft: Secondary | ICD-10-CM | POA: Insufficient documentation

## 2019-06-10 DIAGNOSIS — I252 Old myocardial infarction: Secondary | ICD-10-CM | POA: Insufficient documentation

## 2019-06-10 DIAGNOSIS — G4733 Obstructive sleep apnea (adult) (pediatric): Secondary | ICD-10-CM | POA: Diagnosis present

## 2019-06-10 DIAGNOSIS — E785 Hyperlipidemia, unspecified: Secondary | ICD-10-CM | POA: Diagnosis not present

## 2019-06-10 DIAGNOSIS — Z882 Allergy status to sulfonamides status: Secondary | ICD-10-CM | POA: Insufficient documentation

## 2019-06-10 DIAGNOSIS — Z951 Presence of aortocoronary bypass graft: Secondary | ICD-10-CM | POA: Diagnosis not present

## 2019-06-10 DIAGNOSIS — R079 Chest pain, unspecified: Secondary | ICD-10-CM | POA: Diagnosis not present

## 2019-06-10 DIAGNOSIS — E1121 Type 2 diabetes mellitus with diabetic nephropathy: Secondary | ICD-10-CM | POA: Diagnosis present

## 2019-06-10 DIAGNOSIS — Z79899 Other long term (current) drug therapy: Secondary | ICD-10-CM | POA: Insufficient documentation

## 2019-06-10 DIAGNOSIS — Z1159 Encounter for screening for other viral diseases: Secondary | ICD-10-CM | POA: Diagnosis not present

## 2019-06-10 DIAGNOSIS — J449 Chronic obstructive pulmonary disease, unspecified: Secondary | ICD-10-CM | POA: Diagnosis not present

## 2019-06-10 DIAGNOSIS — I480 Paroxysmal atrial fibrillation: Secondary | ICD-10-CM | POA: Insufficient documentation

## 2019-06-10 DIAGNOSIS — Z20828 Contact with and (suspected) exposure to other viral communicable diseases: Secondary | ICD-10-CM | POA: Diagnosis not present

## 2019-06-10 DIAGNOSIS — I5022 Chronic systolic (congestive) heart failure: Secondary | ICD-10-CM | POA: Diagnosis present

## 2019-06-10 DIAGNOSIS — I5023 Acute on chronic systolic (congestive) heart failure: Secondary | ICD-10-CM | POA: Diagnosis present

## 2019-06-10 DIAGNOSIS — I10 Essential (primary) hypertension: Secondary | ICD-10-CM | POA: Diagnosis not present

## 2019-06-10 DIAGNOSIS — E782 Mixed hyperlipidemia: Secondary | ICD-10-CM | POA: Diagnosis present

## 2019-06-10 DIAGNOSIS — Z888 Allergy status to other drugs, medicaments and biological substances status: Secondary | ICD-10-CM | POA: Insufficient documentation

## 2019-06-10 LAB — BRAIN NATRIURETIC PEPTIDE: B Natriuretic Peptide: 141 pg/mL — ABNORMAL HIGH (ref 0.0–100.0)

## 2019-06-10 LAB — CBC
HCT: 45.8 % (ref 39.0–52.0)
Hemoglobin: 14.2 g/dL (ref 13.0–17.0)
MCH: 26.4 pg (ref 26.0–34.0)
MCHC: 31 g/dL (ref 30.0–36.0)
MCV: 85.3 fL (ref 80.0–100.0)
Platelets: 163 10*3/uL (ref 150–400)
RBC: 5.37 MIL/uL (ref 4.22–5.81)
RDW: 15.9 % — ABNORMAL HIGH (ref 11.5–15.5)
WBC: 6.1 10*3/uL (ref 4.0–10.5)
nRBC: 0 % (ref 0.0–0.2)

## 2019-06-10 LAB — SARS CORONAVIRUS 2 BY RT PCR (HOSPITAL ORDER, PERFORMED IN ~~LOC~~ HOSPITAL LAB): SARS Coronavirus 2: NEGATIVE

## 2019-06-10 LAB — BASIC METABOLIC PANEL
Anion gap: 5 (ref 5–15)
BUN: 16 mg/dL (ref 8–23)
CO2: 30 mmol/L (ref 22–32)
Calcium: 8.7 mg/dL — ABNORMAL LOW (ref 8.9–10.3)
Chloride: 104 mmol/L (ref 98–111)
Creatinine, Ser: 0.86 mg/dL (ref 0.61–1.24)
GFR calc Af Amer: >60 mL/min (ref >60)
GFR calc non Af Amer: >60 mL/min (ref >60)
Glucose, Bld: 167 mg/dL — ABNORMAL HIGH (ref 70–99)
Potassium: 4.1 mmol/L (ref 3.5–5.1)
Sodium: 139 mmol/L (ref 135–145)

## 2019-06-10 LAB — TROPONIN I (HIGH SENSITIVITY)
Troponin I (High Sensitivity): 11 ng/L (ref <18)
Troponin I (High Sensitivity): 13 ng/L (ref <18)

## 2019-06-10 MED ORDER — MORPHINE SULFATE (PF) 4 MG/ML IV SOLN
4.0000 mg | Freq: Once | INTRAVENOUS | Status: AC
  Administered 2019-06-10: 4 mg via INTRAVENOUS
  Filled 2019-06-10: qty 1

## 2019-06-10 MED ORDER — NITROGLYCERIN 2 % TD OINT
0.5000 [in_us] | TOPICAL_OINTMENT | Freq: Four times a day (QID) | TRANSDERMAL | Status: DC | PRN
  Filled 2019-06-10: qty 1

## 2019-06-10 MED ORDER — ASPIRIN 81 MG PO CHEW
324.0000 mg | CHEWABLE_TABLET | Freq: Once | ORAL | Status: AC
  Administered 2019-06-10: 324 mg via ORAL
  Filled 2019-06-10: qty 4

## 2019-06-10 MED ORDER — NITROGLYCERIN 2 % TD OINT
0.5000 [in_us] | TOPICAL_OINTMENT | Freq: Once | TRANSDERMAL | Status: AC
  Administered 2019-06-11: 0.5 [in_us] via TOPICAL
  Filled 2019-06-10: qty 1

## 2019-06-10 MED ORDER — FUROSEMIDE 10 MG/ML IJ SOLN
40.0000 mg | Freq: Once | INTRAMUSCULAR | Status: AC
  Administered 2019-06-10: 22:00:00 40 mg via INTRAVENOUS
  Filled 2019-06-10: qty 4

## 2019-06-10 NOTE — ED Notes (Signed)
ED TO INPATIENT HANDOFF REPORT  ED Nurse Name and Phone #: [phone] Lawrence [name] Name/Age/Gender Lawrence [name] 63 y.o. male Room/Bed: ED15A/ED15A  Code Status   Code Status: Prior  Home/SNF/Other Home Patient oriented to: self Is this baseline? Yes   Triage Complete: Triage complete  Chief Complaint chest pain  Triage Note Per EMS report, patient had a partial circumcision yesterday and went into afib during surgery and has had constant left-side chest pressure since 1100am yesterday. Patient took 3 Nitro without relief. Patient also c/o tingling in left arm.    Allergies Allergies  Allergen Reactions  . Demerol  [Meperidine Hcl]   . Demerol [Meperidine] Hives  . Jardiance [Empagliflozin] Other (See Comments)    Perineal pain  . Prednisone Other (See Comments) and Hypertension    Pt states that this medication puts him in A-fib   . Sulfa Antibiotics Hives  . Albuterol Sulfate [Albuterol] Palpitations and Other (See Comments)    Pt currently uses this medication.    . Morphine Sulfate Nausea And Vomiting, Rash and Other (See Comments)    Pt states that he is only allergic to the tablet form of this medication.      Level of Care/Admitting Diagnosis ED Disposition    ED Disposition Condition [place]: [place] [[id number]]  Level of Care: Telemetry [5]  Covid Evaluation: Asymptomatic Screening Protocol (No Symptoms)  Diagnosis: Chest pain [741287]  Admitting Physician: Lance Coon [[id number]]  Attending Physician: Lance Coon [[id number]]  Bed request comments: 2a  PT Class (Do Not Modify): Observation [104]  PT Acc Code (Do Not Modify): Observation [10022]       B Medical/Surgery History Past Medical History:  Diagnosis Date  . A-fib ([place])   . Anemia   . Anginal pain ([place])   . Anxiety   . Arthritis   . Asthma   . CAD (coronary artery disease)    a. 2002 CABGx2 (LIMA->LAD, VG->VG->OM1);  b. 09/2012 DES->OM;  c.  03/2015 PTCA of LAD [facility]) in setting of atretic LIMA; d. 05/2015 Cath [facility]): nonobs dzs; e. 06/2015 Cath ([facility]): LM nl, LAD 45p/d ISR, 50d, D1/2 small, LCX 50p/d ISR, OM1 70ost, 30 ISR, VG->OM1 50ost, 71m LIMA->LAD 99p/d - atretic, RCA dom, nl; f.cath 10/16: 40-50%(FFR 0.90) pLAD, 75% (FFR 0.77) mLAD s/p PCI/DES, oRCA 40% (FFR0.95)  . Cancer ([place]    SKIN CANCER ON BACK  . Celiac disease   . Chronic diastolic CHF (congestive heart failure) ([place]    a. 06/2009 Echo: EF 60-65%, Gr 1 DD, triv AI, mildly dil LA, nl RV.  .Marland KitchenCOPD (chronic obstructive pulmonary disease) ([place]    a. Chronic bronchitis and emphysema.  . DDD (degenerative disc disease), lumbar   . Diverticulosis   . Dysrhythmia   . Essential hypertension   . GERD (gastroesophageal reflux disease)   . History of hiatal hernia   . History of kidney stones    H/O  . History of tobacco abuse    a. Quit 2014.  .Marland KitchenMyocardial infarction ([place] 2002   4 STENTS  . Pancreatitis   . PSVT (paroxysmal supraventricular tachycardia) ([place]    a. 10/2012 Noted on Zio Patch.  . Sleep apnea    LOST CORD TO CPAP -ONLY 02 @ BEDTIME  . Tubular adenoma of colon   . Type II diabetes mellitus ([place]    Past Surgical History:  Procedure Laterality Date  . BYPASS GRAFT    .  CARDIAC CATHETERIZATION N/A 07/12/2015   rocedure: Left Heart Cath and Cors/Grafts Angiography;  Surgeon: Belva Crome, MD;  Location: [place] [facility];  Service: Cardiovascular;  Laterality: N/A;  . CARDIAC CATHETERIZATION Right 10/07/2015   Procedure: Left Heart Cath and Cors/Grafts Angiography;  Surgeon: Dionisio David, MD;  Location: [place] [facility];  Service: Cardiovascular;  Laterality: Right;  . CARDIAC CATHETERIZATION N/A 04/06/2016   Procedure: Left Heart Cath and Coronary Angiography;  Surgeon: Yolonda Kida, MD;  Location: [place] [facility];  Service: Cardiovascular;  Laterality: N/A;  . CARDIAC CATHETERIZATION  04/06/2016   Procedure: Bypass Graft Angiography;   Surgeon: Yolonda Kida, MD;  Location: [place] [facility];  Service: Cardiovascular;;  . CARDIAC CATHETERIZATION N/A 11/02/2016   Procedure: Left Heart Cath and Cors/Grafts Angiography and possible PCI;  Surgeon: Yolonda Kida, MD;  Location: [place] [facility];  Service: Cardiovascular;  Laterality: N/A;  . CARDIAC CATHETERIZATION N/A 11/02/2016   Procedure: Coronary Stent Intervention;  Surgeon: Yolonda Kida, MD;  Location: [place] [facility];  Service: Cardiovascular;  Laterality: N/A;  . CHOLECYSTECTOMY    . CIRCUMCISION N/A 06/09/2019   Procedure: CIRCUMCISION ADULT;  Surgeon: Billey Co, MD;  Location: [facility] ORS;  Service: Urology;  Laterality: N/A;  . COLONOSCOPY WITH PROPOFOL N/A 04/01/2018   Procedure: COLONOSCOPY WITH PROPOFOL;  Surgeon: Manya Silvas, MD;  Location[facility];  Service: Endoscopy;  Laterality: N/A;  . ESOPHAGEAL DILATION    . ESOPHAGOGASTRODUODENOSCOPY (EGD) WITH PROPOFOL N/A 04/01/2018   Procedure: ESOPHAGOGASTRODUODENOSCOPY (EGD) WITH PROPOFOL;  Surgeon: Manya Silvas, MD;  Location: [facility] ENDOSCOPY;  Service: Endoscopy;  Laterality: N/A;  . TONSILLECTOMY    . VASCULAR SURGERY       A IV Location/Drains/Wounds Patient Lines/Drains/Airways Status   Active Line/Drains/Airways    Name:   Placement date:   Placement time:   Site:   Days:   Peripheral IV 06/10/19 Left Antecubital   06/10/19    2023    Antecubital   less than 1   Incision (Closed) 06/09/19 Penis   06/09/19    0736     1          Intake/Output Last 24 hours  Intake/Output Summary (Last 24 hours) at 06/10/2019 2310 Last data filed at 06/10/2019 2230 Gross per 24 hour  Intake -  Output 1150 ml  Net -1150 ml    Labs/Imaging Results for orders placed or performed during the hospital encounter of 06/10/19 (from the past 48 hour(s))  Basic metabolic panel     Status: Abnormal   Collection Time: 06/10/19  8:28 PM  Result Value Ref Range   Sodium 139 135 - 145  mmol/L   Potassium 4.1 3.5 - 5.1 mmol/L   Chloride 104 98 - 111 mmol/L   CO2 30 22 - 32 mmol/L   Glucose, Bld 167 (H) 70 - 99 mg/dL   BUN 16 8 - 23 mg/dL   Creatinine, Ser 0.86 0.61 - 1.24 mg/dL   Calcium 8.7 (L) 8.9 - 10.3 mg/dL   GFR calc non Af Amer >60 >60 mL/min   GFR calc Af Amer >60 >60 mL/min   Anion gap 5 5 - 15    Comment: Performed at [facility], [address] [place], [place] [address]  CBC     Status: Abnormal   Collection Time: 06/10/19  8:28 PM  Result Value Ref Range   WBC 6.1 4.0 - 10.5 K/uL  RBC 5.37 4.22 - 5.81 MIL/uL   Hemoglobin 14.2 13.0 - 17.0 g/dL   HCT 45.8 39.0 - 52.0 %   MCV 85.3 80.0 - 100.0 fL   MCH 26.4 26.0 - 34.0 pg   MCHC 31.0 30.0 - 36.0 g/dL   RDW 15.9 (H) 11.5 - 15.5 %   Platelets 163 150 - 400 K/uL   nRBC 0.0 0.0 - 0.2 %    Comment: Performed at [facility], [place] [address]  Troponin I (High Sensitivity)     Status: None   Collection Time: 06/10/19  8:28 PM  Result Value Ref Range   Troponin I (High Sensitivity) 11 <18 ng/L    Comment: (NOTE) Elevated high sensitivity troponin I (hsTnI) values and significant  changes across serial measurements may suggest ACS but many other  chronic and acute conditions are known to elevate hsTnI results.  Refer to the "Links" section for chest pain algorithms and additional  guidance. Performed at [facility], [address] [place], [place] [address]   Brain natriuretic peptide     Status: Abnormal   Collection Time: 06/10/19  8:28 PM  Result Value Ref Range   B Natriuretic Peptide 141.0 (H) 0.0 - 100.0 pg/mL    Comment: Performed at [facility], [address], [place], [place] [address]  SARS Coronavirus 2 (CEPHEID - Performed in [place] hospital lab), Hosp Order     Status: None   Collection Time: 06/10/19  8:54 PM   Specimen: Nasopharyngeal Swab  Result Value Ref Range   SARS Coronavirus 2 NEGATIVE NEGATIVE     Comment: (NOTE) If result is NEGATIVE SARS-CoV-2 target nucleic acids are NOT DETECTED. The SARS-CoV-2 RNA is generally detectable in upper and lower  respiratory specimens during the acute phase of infection. The lowest  concentration of SARS-CoV-2 viral copies this assay can detect is 250  copies / mL. A negative result does not preclude SARS-CoV-2 infection  and should not be used as the sole basis for treatment or other  patient management decisions.  A negative result may occur with  improper specimen collection / handling, submission of specimen other  than nasopharyngeal swab, presence of viral mutation(s) within the  areas targeted by this assay, and inadequate number of viral copies  (<250 copies / mL). A negative result must be combined with clinical  observations, patient history, and epidemiological information. If result is POSITIVE SARS-CoV-2 target nucleic acids are DETECTED. The SARS-CoV-2 RNA is generally detectable in upper and lower  respiratory specimens dur ing the acute phase of infection.  Positive  results are indicative of active infection with SARS-CoV-2.  Clinical  correlation with patient history and other diagnostic information is  necessary to determine patient infection status.  Positive results do  not rule out bacterial infection or co-infection with other viruses. If result is PRESUMPTIVE POSTIVE SARS-CoV-2 nucleic acids MAY BE PRESENT.   A presumptive positive result was obtained on the submitted specimen  and confirmed on repeat testing.  While 2019 novel coronavirus  (SARS-CoV-2) nucleic acids may be present in the submitted sample  additional confirmatory testing may be necessary for epidemiological  and / or clinical management purposes  to differentiate between  SARS-CoV-2 and other Sarbecovirus currently known to infect humans.  If clinically indicated additional testing with an alternate test  methodology [phone]) is advised. The SARS-CoV-2  RNA is generally  detectable in upper and lower respiratory sp ecimens during the acute  phase of infection. The expected result is Negative. Fact Sheet for Patients:  StrictlyIdeas.no Fact Sheet for Healthcare Providers: BankingDealers.co.za This test is not yet approved or cleared by the [place] FDA and has been authorized for detection and/or diagnosis of SARS-CoV-2 by FDA under an Emergency Use Authorization (EUA).  This EUA will remain in effect (meaning this test can be used) for the duration of the COVID-19 declaration under Section 564(b)(1) of the Act, 21 U.S.C. section 360bbb-3(b)(1), unless the authorization is terminated or revoked sooner. Performed at [facility], [place], [place] [address]   Troponin I (High Sensitivity)     Status: None   Collection Time: 06/10/19 10:32 PM  Result Value Ref Range   Troponin I (High Sensitivity) 13 <18 ng/L    Comment: (NOTE) Elevated high sensitivity troponin I (hsTnI) values and significant  changes across serial measurements may suggest ACS but many other  chronic and acute conditions are known to elevate hsTnI results.  Refer to the "Links" section for chest pain algorithms and additional  guidance. Performed at [facility], [address] [place], [place] [address]    Dg Chest [place] 1 View  Result Date: 06/10/2019 CLINICAL DATA:  63 year old male with chest pain. EXAM: PORTABLE CHEST 1 VIEW COMPARISON:  Chest radiograph dated 12/14/2018 FINDINGS: Diffuse interstitial coarsening and chronic bronchitic changes. There is stable cardiomegaly with multi vessel coronary vascular calcification and postsurgical changes of CABG. There is vascular congestion and probable mild edema. No pleural effusion, focal consolidation, or pneumothorax. No acute osseous pathology. IMPRESSION: Cardiomegaly with vascular congestion and possible mild interstitial  edema. No focal consolidation. Electronically Signed   By: Anner Crete M.D.   On: 06/10/2019 20:50    Pending Labs [employer] (From admission, onward)    Start     Ordered   Signed and Held  HIV antibody (Routine Testing)  Once,   R     Signed and Held   Signed and Held  Basic metabolic panel  Tomorrow morning,   R     Signed and Held   Signed and Held  CBC  Tomorrow morning,   R     Signed and Held          Vitals/Pain Today's Vitals   06/10/19 2018 06/10/19 2130 06/10/19 2143 06/10/19 2153  BP:  (!) 162/84    Pulse:  (!) 57    Resp:  12    Temp:      TempSrc:      SpO2:  98%    Weight: 112.5 kg     Height: 5' 7"  (1.702 m)     PainSc: 9   9  7      Isolation Precautions No active isolations  Medications Medications  aspirin chewable tablet 324 mg (324 mg Oral Given 06/10/19 2055)  morphine 4 MG/ML injection 4 mg (4 mg Intravenous Given 06/10/19 2145)  furosemide (LASIX) injection 40 mg (40 mg Intravenous Given 06/10/19 2148)    Mobility walks with device Low fall risk   Focused Assessments Cardiac Assessment Handoff:  Cardiac Rhythm: Sinus bradycardia Lab Results  Component Value Date   CKTOTAL 93 05/23/2014   CKMB 1.7 05/23/2014   TROPONINI <0.03 12/15/2018   No results found for: DDIMER Does the Patient currently have chest pain? Yes     R Recommendations: See Admitting Provider Note  Report given to:   Additional Notes:

## 2019-06-10 NOTE — H&P (Signed)
[facility] [place] NAME: Lawrence [name]    MR#:  [id number]  DATE OF BIRTH:  09-22-1956  DATE OF ADMISSION:  06/10/2019  PRIMARY CARE PHYSICIAN: Birdie Sons, MD   REQUESTING/REFERRING PHYSICIAN: Jimmye Norman, MD  CHIEF COMPLAINT:   Chief Complaint  Patient presents with  . Chest Pain    HISTORY OF PRESENT ILLNESS:  Lawrence [name]  is a 63 y.o. male who presents with chief complaint as above.  Patient presents with a complaint of chest pain.  He has significant cardiac history and has had prior major MI as well as double bypass and 4 stents placed.  He states that this chest pain started 24 hours ago or so, and has been persistent since then, sometimes worsening.  His chest pain is radiated to his left shoulder and down his left arm.  He decided to come to the ED for evaluation.  Initial work-up here in the ED is largely within normal limits.  However, given his history and his risk factors, along with his persistent pain, hospitalist were called for admission  PAST MEDICAL HISTORY:   Past Medical History:  Diagnosis Date  . A-fib ([place])   . Anemia   . Anginal pain ([place])   . Anxiety   . Arthritis   . Asthma   . CAD (coronary artery disease)    a. 2002 CABGx2 (LIMA->LAD, VG->VG->OM1);  b. 09/2012 DES->OM;  c. 03/2015 PTCA of LAD [facility]) in setting of atretic LIMA; d. 05/2015 Cath [facility]): nonobs dzs; e. 06/2015 Cath ([facility]): LM nl, LAD 45p/d ISR, 50d, D1/2 small, LCX 50p/d ISR, OM1 70ost, 30 ISR, VG->OM1 50ost, 48m LIMA->LAD 99p/d - atretic, RCA dom, nl; f.cath 10/16: 40-50%(FFR 0.90) pLAD, 75% (FFR 0.77) mLAD s/p PCI/DES, oRCA 40% (FFR0.95)  . Cancer ([place]    SKIN CANCER ON BACK  . Celiac disease   . Chronic diastolic CHF (congestive heart failure) ([place]    a. 06/2009 Echo: EF 60-65%, Gr 1 DD, triv AI, mildly dil LA, nl RV.  .Marland KitchenCOPD (chronic obstructive pulmonary disease) ([place]    a. Chronic bronchitis and emphysema.  . DDD  (degenerative disc disease), lumbar   . Diverticulosis   . Dysrhythmia   . Essential hypertension   . GERD (gastroesophageal reflux disease)   . History of hiatal hernia   . History of kidney stones    H/O  . History of tobacco abuse    a. Quit 2014.  .Marland KitchenMyocardial infarction ([place] 2002   4 STENTS  . Pancreatitis   . PSVT (paroxysmal supraventricular tachycardia) ([place]    a. 10/2012 Noted on Zio Patch.  . Sleep apnea    LOST CORD TO CPAP -ONLY 02 @ BEDTIME  . Tubular adenoma of colon   . Type II diabetes mellitus ([place]      PAST SURGICAL HISTORY:   Past Surgical History:  Procedure Laterality Date  . BYPASS GRAFT    . CARDIAC CATHETERIZATION N/A 07/12/2015   rocedure: Left Heart Cath and Cors/Grafts Angiography;  [name]: HBelva Crome MD;  [place];  Service: Cardiovascular;  Laterality: N/A;  . CARDIAC CATHETERIZATION Right 10/07/2015   Procedure: Left Heart Cath and Cors/Grafts Angiography;  [name]: SDionisio  MD;  [place] [facility];  Service: Cardiovascular;  Laterality: Right;  . CARDIAC CATHETERIZATION N/A 04/06/2016   Procedure: Left Heart Cath and Coronary Angiography;  [name]: DYolonda Kida MD;  [place]  [facility];  Service: Cardiovascular;  Laterality: N/A;  . CARDIAC CATHETERIZATION  04/06/2016   Procedure: Bypass Graft Angiography;  [name]: Yolonda Kida, MD;  Location: [place] CV LAB;  Service: Cardiovascular;;  . CARDIAC CATHETERIZATION N/A 11/02/2016   Procedure: Left Heart Cath and Cors/Grafts Angiography and possible PCI;  [name]: Yolonda Kida, MD;  Location: [place] CV LAB;  Service: Cardiovascular;  Laterality: N/A;  . CARDIAC CATHETERIZATION N/A 11/02/2016   Procedure: Coronary Stent Intervention;  [name]: Yolonda Kida, MD;  Location: [place] CV LAB;  Service: Cardiovascular;  Laterality: N/A;  . CHOLECYSTECTOMY    . CIRCUMCISION N/A 06/09/2019   Procedure: CIRCUMCISION  ADULT;  [name]: Billey Co, MD;  Location: [facility] ORS;  Service: Urology;  Laterality: N/A;  . COLONOSCOPY WITH PROPOFOL N/A 04/01/2018   Procedure: COLONOSCOPY WITH PROPOFOL;  [name]: Manya Silvas, MD;  Location[facility] ENDOSCOPY;  Service: Endoscopy;  Laterality: N/A;  . ESOPHAGEAL DILATION    . ESOPHAGOGASTRODUODENOSCOPY (EGD) WITH PROPOFOL N/A 04/01/2018   Procedure: ESOPHAGOGASTRODUODENOSCOPY (EGD) WITH PROPOFOL;  [name]: Manya Silvas, MD;  Location: [facility] ENDOSCOPY;  Service: Endoscopy;  Laterality: N/A;  . TONSILLECTOMY    . VASCULAR SURGERY       SOCIAL HISTORY:   Social History   Tobacco Use  . Smoking status: Former Smoker    Packs/day: 3.00    Years: 50.00    Pack years: 150.00    Types: Cigarettes    Quit date: 04/22/2013    Years since quitting: 6.1  . Smokeless tobacco: Never Used  Substance Use Topics  . Alcohol use: No    Comment: remotely quit alcohol use. Hx of heavy alcohol use.     FAMILY HISTORY:   Family History  Problem Relation Age of Onset  . Heart attack Mother   . Depression Mother   . Heart disease Mother   . COPD Mother   . Hypertension Mother   . Heart attack Father   . Diabetes Father   . Depression Father   . Heart disease Father   . Cirrhosis Father   . Parkinson's disease Brother      DRUG ALLERGIES:   Allergies  Allergen Reactions  . Demerol  [Meperidine Hcl]   . Demerol [Meperidine] Hives  . Jardiance [Empagliflozin] Other (See Comments)    Perineal pain  . Prednisone Other (See Comments) and Hypertension    Pt states that this medication puts him in A-fib   . Sulfa Antibiotics Hives  . Albuterol Sulfate [Albuterol] Palpitations and Other (See Comments)    Pt currently uses this medication.    . Morphine Sulfate Nausea And Vomiting, Rash and Other (See Comments)    Pt states that he is only allergic to the tablet form of this medication.      MEDICATIONS AT HOME:   Prior to Admission medications    Medication Sig Start Date End Date Taking? Authorizing Provider  Albuterol Sulfate, sensor, 108 (90 Base) MCG/ACT AEPB Inhale 2 puffs into the lungs at bedtime. AND PRN    [provider]  allopurinol (ZYLOPRIM) 300 MG tablet Take 1 tablet (300 mg total) by mouth 2 (two) times daily. 10/05/18   Birdie Sons, MD  ALPRAZolam (XANAX) 1 MG tablet TAKE ONE-HALF TO ONE TABLET BY MOUTH THREE TIMES A DAY AS NEEDED Patient taking differently: Take 1 mg by mouth 3 (three) times daily.  02/23/19   Birdie Sons, MD  apixaban (ELIQUIS) 5 MG TABS  tablet Take 5 mg by mouth 2 (two) times daily.     [provider]  aspirin 81 MG chewable tablet Chew 81 mg by mouth daily.    [provider]  atorvastatin (LIPITOR) 80 MG tablet TAKE ONE TABLET BY MOUTH AT BEDTIME. Patient taking differently: Take 80 mg by mouth at bedtime.  09/19/18   Birdie Sons, MD  Blood Glucose Monitoring Suppl (ACCU-CHEK AVIVA PLUS) w/Device KIT CHECK BLOOD SUGAR EVERY DAY Patient not taking: Reported on 05/31/2019 12/08/17   Birdie Sons, MD  cetirizine (ZYRTEC) 10 MG tablet Take 10 mg by mouth at bedtime.     [provider]  Cholecalciferol (VITAMIN D3) 125 MCG (5000 UT) CAPS Take 5,000 Units by mouth daily.    [provider]  dapagliflozin propanediol (FARXIGA) 10 MG TABS tablet Take 10 mg by mouth daily. Patient taking differently: Take 10 mg by mouth every morning.  05/29/19   Birdie Sons, MD  docusate sodium (COLACE) 100 MG capsule Take 100 mg by mouth daily.     [provider]  fluticasone furoate-vilanterol (BREO ELLIPTA) 100-25 MCG/INH AEPB Inhale 1 puff into the lungs every morning.     [provider]  glucose blood (ACCU-CHEK AVIVA PLUS) test strip Use to check blood sugar once a day Patient not taking: Reported on 05/31/2019 02/14/18   Birdie Sons, MD  isosorbide mononitrate (IMDUR) 120 MG 24 hr tablet TAKE 1 TABLET BY MOUTH DAILY Patient taking  differently: Take 120 mg by mouth every morning.  01/19/19   Birdie Sons, MD  LINZESS 72 MCG capsule Take 72 mcg by mouth daily before breakfast.     [provider]  lisinopril (PRINIVIL,ZESTRIL) 2.5 MG tablet TAKE 1 TABLET BY MOUTH DAILY Patient taking differently: Take 2.5 mg by mouth every morning.  10/20/18   Birdie Sons, MD  magnesium oxide (MAG-OX) 400 (241.3 Mg) MG tablet TAKE 1 TABLET BY MOUTH DAILY Patient taking differently: Take 400 mg by mouth every morning.  10/28/18   Alisa Graff, FNP  methocarbamol (ROBAXIN) 500 MG tablet Take 1 tablet (500 mg total) by mouth 4 (four) times daily. 05/29/19   Birdie Sons, MD  nitroGLYCERIN (NITROSTAT) 0.4 MG SL tablet Place 1 tablet (0.4 mg total) under the tongue every 5 (five) minutes as needed for chest pain. Reported on 05/26/2016 11/28/18   Birdie Sons, MD  omega-3 acid ethyl esters (LOVAZA) 1 g capsule TAKE FOUR CAPSULES BY MOUTH DAILY Patient taking differently: Take 4 g by mouth daily.  06/24/18   Birdie Sons, MD  omeprazole (PRILOSEC) 40 MG capsule Take 40 mg by mouth 2 (two) times a day.  04/24/19   [provider]  oxyCODONE-acetaminophen (PERCOCET) 10-325 MG tablet Take 1 tablet by mouth every 4 (four) hours as needed. Patient taking differently: Take 1 tablet by mouth every 4 (four) hours.  05/21/19   Mar Daring, PA-C  oxyCODONE-acetaminophen (PERCOCET) 10-325 MG tablet Take 1 tablet by mouth every 6 (six) hours as needed for up to 3 days for pain. 06/09/19 06/12/19  Billey Co, MD  OXYGEN Inhale 2 L into the lungs continuous.     [provider]  sotalol (BETAPACE) 80 MG tablet Take 80 mg by mouth 2 (two) times daily.  10/05/18   [provider]  tiotropium (SPIRIVA HANDIHALER) 18 MCG inhalation capsule Place 1 capsule (18 mcg total) into inhaler and inhale daily. Patient taking differently: Place  18 mcg into inhaler and inhale every morning.  02/20/19   Birdie Sons,  MD  torsemide (DEMADEX) 100 MG tablet Take 50 mg by mouth daily as needed (for fluid).  10/11/17   [provider]  traZODone (DESYREL) 100 MG tablet Take 1 tablet (100 mg total) by mouth at bedtime. For sleep 05/15/19   Chrismon, Vickki Muff, PA  venlafaxine XR (EFFEXOR-XR) 75 MG 24 hr capsule Take 1 capsule (75 mg total) by mouth daily with breakfast. 04/04/19   Birdie Sons, MD    REVIEW OF SYSTEMS:  Review of Systems  Constitutional: Negative for chills, fever, malaise/fatigue and weight loss.  HENT: Negative for ear pain, hearing loss and tinnitus.   Eyes: Negative for blurred vision, double vision, pain and redness.  Respiratory: Negative for cough, hemoptysis and shortness of breath.   Cardiovascular: Positive for chest pain. Negative for palpitations, orthopnea and leg swelling.  Gastrointestinal: Negative for abdominal pain, constipation, diarrhea, nausea and vomiting.  Genitourinary: Negative for dysuria, frequency and hematuria.  Musculoskeletal: Negative for back pain, joint pain and neck pain.  Skin:       No acne, rash, or lesions  Neurological: Negative for dizziness, tremors, focal weakness and weakness.  Endo/Heme/Allergies: Negative for polydipsia. Does not bruise/bleed easily.  Psychiatric/Behavioral: Negative for depression. The patient is not nervous/anxious and does not have insomnia.      VITAL SIGNS:   Vitals:   06/10/19 2015 06/10/19 2016 06/10/19 2018 06/10/19 2130  BP: (!) 173/94 (!) 173/94  (!) 162/84  Pulse: (!) 58 (!) 51  (!) 57  Resp: (!) _0 Temp:  98 F (36.7 C)    TempSrc:  Oral    SpO2: 98% 96%  98%  Weight:   112.5 kg   Height:   _1  (1.702 m)    Wt Readings from Last 3 Encounters:  06/10/19 112.5 kg  06/06/19 109.7 kg  05/29/19 111.5 kg    PHYSICAL EXAMINATION:  Physical Exam  Vitals reviewed. Constitutional: He is oriented to person, place, and time. He appears well-developed and well-nourished. No distress.  HENT:   Head: Normocephalic and atraumatic.  Mouth/Throat: Oropharynx is clear and moist.  Eyes: Pupils are equal, round, and reactive to light. Conjunctivae and EOM are normal. No scleral icterus.  Neck: Normal range of motion. Neck supple. No JVD present. No thyromegaly present.  Cardiovascular: Normal rate, regular rhythm and intact distal pulses. Exam reveals no gallop and no friction rub.  No murmur heard. Respiratory: Effort normal and breath sounds normal. No respiratory distress. He has no wheezes. He has no rales.  GI: Soft. Bowel sounds are normal. He exhibits no distension. There is no abdominal tenderness.  Musculoskeletal: Normal range of motion.        General: No edema.     Comments: No arthritis, no gout  Lymphadenopathy:    He has no cervical adenopathy.  Neurological: He is alert and oriented to person, place, and time. No cranial nerve deficit.  No dysarthria, no aphasia  Skin: Skin is warm and dry. No rash noted. No erythema.  Psychiatric: He has a normal mood and affect. His behavior is normal. Judgment and thought content normal.    LABORATORY PANEL:   CBC Recent Labs  Lab 06/10/19 2028  WBC 6.1  HGB 14.2  HCT 45.8  PLT 163   ------------------------------------------------------------------------------------------------------------------  Chemistries  Recent Labs  Lab 06/10/19 2028  NA 139  K 4.1  CL 104  CO2 30  GLUCOSE 167*  BUN 16  CREATININE 0.86  CALCIUM 8.7*   ------------------------------------------------------------------------------------------------------------------  Cardiac Enzymes No results for input(s): TROPONINI in the last 168 hours. ------------------------------------------------------------------------------------------------------------------  RADIOLOGY:  Dg Chest Port 1 View  Result Date: 06/10/2019 CLINICAL DATA:  63 year old male with chest pain. EXAM: PORTABLE CHEST 1 VIEW COMPARISON:  Chest radiograph dated 12/14/2018  FINDINGS: Diffuse interstitial coarsening and chronic bronchitic changes. There is stable cardiomegaly with multi vessel coronary vascular calcification and postsurgical changes of CABG. There is vascular congestion and probable mild edema. No pleural effusion, focal consolidation, or pneumothorax. No acute osseous pathology. IMPRESSION: Cardiomegaly with vascular congestion and possible mild interstitial edema. No focal consolidation. Electronically Signed   By: Anner Crete M.D.   On: 06/10/2019 20:50    EKG:   Orders placed or performed during the hospital encounter of 06/10/19  . EKG 12-Lead  . EKG 12-Lead  . ED EKG  . ED EKG    IMPRESSION AND PLAN:  Principal Problem:   Chest pain -unclear etiology, high concern for risk for ACS.  High-sensitivity troponins in the ED were normal.  Chest pain is persistent.  Symptom control.  Cardiology consult Active Problems:   CAD (coronary artery disease) -continue home meds, other work-up as above   HTN (hypertension) -home dose antihypertensives   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HCC) -sliding scale insulin coverage   COPD (chronic obstructive pulmonary disease) (HCC) -home dose inhalers   Chronic diastolic CHF (congestive heart failure) ([place]) -continue home meds   Paroxysmal atrial fibrillation ([place]) -continue home medications for rate control.  We will hold his Eliquis for now as the patient states that he just had a circumcision procedure performed and has been told not to restart this until Monday.   GERD (gastroesophageal reflux disease) -home dose PPI   HLD (hyperlipidemia) -home dose antilipid   OSA (obstructive sleep apnea) -CPAP nightly  Chart review performed and case discussed with ED provider. Labs, imaging and/or ECG reviewed by provider and discussed with patient/family. Management plans discussed with the patient and/or family.  COVID-19 status: Tested negative     DVT PROPHYLAXIS: Mechanical only  GI  PROPHYLAXIS:  PPI   ADMISSION STATUS: Observation  CODE STATUS: Full Code Status History    Date Active Date Inactive Code Status Order ID Comments User Context   12/14/2018 2323 12/16/2018 1505 Full Code [id number]  [name], MD Inpatient   04/18/2018 1546 04/19/2018 1831 Full Code [id number]  Saundra Shelling, MD Inpatient   10/01/2017 0053 10/01/2017 1758 Full Code [id number]  Lance Coon, MD Inpatient   10/29/2016 2222 11/03/2016 1618 Full Code [id number]  Lance Coon, MD Inpatient   09/03/2016 0143 09/03/2016 1846 Full Code [id number]  Lance Coon, MD ED   06/25/2016 0036 06/26/2016 1535 Full Code [id number]  Lance Coon, MD ED   04/03/2016 2055 04/07/2016 1759 Full Code [id number]  Theodoro Grist, MD Inpatient   03/26/2016 2224 03/28/2016 1800 Full Code [id number]  Lance Coon, MD Inpatient   10/07/2015 0936 10/07/2015 1830 Full Code [id number]  Dionisio Jaeley Wiker, MD Inpatient   10/07/2015 0826 10/07/2015 0936 Full Code [id number]  Dionisio Samanthia Howland, MD Inpatient   10/05/2015 1929 10/07/2015 0826 Full Code [id number]  Idelle Crouch, MD Inpatient   09/12/2015 2129 09/14/2015 1425 Full Code [id number]  Demetrios Loll, MD Inpatient   08/01/2015 0521 08/02/2015 1346 Full Code [id number]  Lance Coon, MD Inpatient   07/12/2015 1524 07/13/2015 2017 Full Code [id number]  Belva Crome, MD  Inpatient   07/11/2015 2000 07/12/2015 1524 Full Code [id number]  Reola Mosher Inpatient   06/26/2015 0543 06/27/2015 1530 Full Code [id number]  Juluis Mire, MD Inpatient   Advance Care Planning Activity      TOTAL TIME TAKING CARE OF THIS PATIENT: 40 minutes.   This patient was evaluated in the context of the global COVID-19 pandemic, which necessitated consideration that the patient might be at risk for infection with the SARS-CoV-2 virus that causes COVID-19. Institutional protocols and algorithms that pertain to the evaluation of patients at risk for COVID-19 are in a state of rapid change based on  information released by regulatory bodies including the CDC and federal and state organizations. These policies and algorithms were followed to the best of this provider's knowledge to date during the patient's care at this facility.  Ethlyn Daniels 06/10/2019, 10:40 PM  [facility]  Office  [phone]  CC: Primary care physician; Birdie Sons, MD  Note:  This document was prepared using Dragon voice recognition software and may include unintentional dictation errors.

## 2019-06-10 NOTE — ED Provider Notes (Signed)
[facility] Emergency Department Provider Note  ____________________________________________  Time seen: Approximately 8:10 PM  I have reviewed the triage vital signs and the nursing notes.   HISTORY  Chief Complaint Chest Pain    HPI Lawrence [name] is a 63 y.o. male who presents the emergency department complaining of left-sided chest pain radiating down the left arm.  Patient had a partial circumcision yesterday after paraphimosis that occurred earlier this month.  Surgery occurred yesterday, while in surgery patient reports that he went into A. fib.  Patient reports that when he got home, he started having left-sided chest pain.  This has persisted till today.  Patient reports that it became worse so he has taken 3 nitros at home, the last approximately 45 minutes prior to arrival.  Patient denies abatement of symptoms with medication.  Patient typically takes both baby aspirin as well as Eliquis but has stopped these medications for surgery.  Patient's cardiologist, Dr. [name] had approved the patient for surgery.  Patient denies any other significant symptoms to include headache, neck pain or stiffness, gross shortness of breath, domino pain, nausea or vomiting.  Patient is reporting at this time the chest pain is consistent with previous MI.  Patient has a history of A. fib, anemia, angina, asthma, coronary artery disease, celiac disease, CHF, COPD, hypertension, BPH, sleep apnea.  Patient has had MIs in the past and has 4 stents placed.         Past Medical History:  Diagnosis Date  . A-fib ([place])   . Anemia   . Anginal pain ([place])   . Anxiety   . Arthritis   . Asthma   . CAD (coronary artery disease)    a. 2002 CABGx2 (LIMA->LAD, VG->VG->OM1);  b. 09/2012 DES->OM;  c. 03/2015 PTCA of LAD [facility]) in setting of atretic LIMA; d. 05/2015 Cath [facility]): nonobs dzs; e. 06/2015 Cath ([facility]): LM nl, LAD 45p/d ISR, 50d, D1/2 small, LCX 50p/d ISR, OM1 70ost, 30 ISR,  VG->OM1 50ost, 14m LIMA->LAD 99p/d - atretic, RCA dom, nl; f.cath 10/16: 40-50%(FFR 0.90) pLAD, 75% (FFR 0.77) mLAD s/p PCI/DES, oRCA 40% (FFR0.95)  . Cancer ([place]    SKIN CANCER ON BACK  . Celiac disease   . Chronic diastolic CHF (congestive heart failure) ([place]    a. 06/2009 Echo: EF 60-65%, Gr 1 DD, triv AI, mildly dil LA, nl RV.  .Marland KitchenCOPD (chronic obstructive pulmonary disease) ([place]    a. Chronic bronchitis and emphysema.  . DDD (degenerative disc disease), lumbar   . Diverticulosis   . Dysrhythmia   . Essential hypertension   . GERD (gastroesophageal reflux disease)   . History of hiatal hernia   . History of kidney stones    H/O  . History of tobacco abuse    a. Quit 2014.  .Marland KitchenMyocardial infarction ([place] 2002   4 STENTS  . Pancreatitis   . PSVT (paroxysmal supraventricular tachycardia) ([place]    a. 10/2012 Noted on Zio Patch.  . Sleep apnea    LOST CORD TO CPAP -ONLY 02 @ BEDTIME  . Tubular adenoma of colon   . Type II diabetes mellitus ([facility]     Patient Active Problem List   Diagnosis Date Noted  . Chronic respiratory failure with hypoxia ([place] 05/29/2019  . Morbid obesity ([place] 05/29/2019  . Trigger thumb of right hand 11/28/2018  . Eye pain, left 08/18/2018  . Acute on chronic heart failure ([place] 04/18/2018  . History of adenomatous polyp of colon  04/05/2018  . Abdominal pain, chronic, epigastric 11/06/2017  . Bilateral flank pain 03/24/2017  . Dyspnea 04/03/2016  . Hypotension 04/03/2016  . CKD (chronic kidney disease) stage 2, GFR 60-89 ml/min 04/03/2016  . Anemia 04/03/2016  . Paroxysmal atrial fibrillation ([place]) 12/23/2015  . OSA (obstructive sleep apnea) 12/10/2015  . Left inguinal hernia 11/07/2015  . Anxiety 11/07/2015  . Unstable angina ([place]) 10/05/2015  . Back pain with left-sided radiculopathy 09/30/2015  . Nocturnal hypoxia 09/06/2015  . BPH (benign prostatic hyperplasia) 08/01/2015  . Chronic diastolic CHF (congestive heart failure) ([place])   .  Angina pectoris ([place])   . Chest pain 07/11/2015  . COPD (chronic obstructive pulmonary disease) ([place]) 07/03/2015  . CAD (coronary artery disease) 06/26/2015  . HTN (hypertension) 06/26/2015  . 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[place]) 06/26/2015  . Achalasia 07/24/2014  . GERD (gastroesophageal reflux disease) 06/07/2014  . Former tobacco use 04/11/2013  . HLD (hyperlipidemia) 04/09/2013    Past Surgical History:  Procedure Laterality Date  . BYPASS GRAFT    . CARDIAC CATHETERIZATION N/A 07/12/2015   rocedure: Left Heart Cath and Cors/Grafts Angiography;  Surgeon: Belva Crome, MD;  Location: [place] CV LAB;  Service: Cardiovascular;  Laterality: N/A;  . CARDIAC CATHETERIZATION Right 10/07/2015   Procedure: Left Heart Cath and Cors/Grafts Angiography;  Surgeon: Dionisio David, MD;  Location: [place] CV LAB;  Service: Cardiovascular;  Laterality: Right;  . CARDIAC CATHETERIZATION N/A 04/06/2016   Procedure: Left Heart Cath and Coronary Angiography;  Surgeon: Yolonda Kida, MD;  Location: [place] CV LAB;  Service: Cardiovascular;  Laterality: N/A;  . CARDIAC CATHETERIZATION  04/06/2016   Procedure: Bypass Graft Angiography;  Surgeon: Yolonda Kida, MD;  Location: [place] CV LAB;  Service: Cardiovascular;;  . CARDIAC CATHETERIZATION N/A 11/02/2016   Procedure: Left Heart Cath and Cors/Grafts Angiography and possible PCI;  Surgeon: Yolonda Kida, MD;  Location: [place] CV LAB;  Service: Cardiovascular;  Laterality: N/A;  . CARDIAC CATHETERIZATION N/A 11/02/2016   Procedure: Coronary Stent Intervention;  Surgeon: Yolonda Kida, MD;  Location: [place] CV LAB;  Service: Cardiovascular;  Laterality: N/A;  . CHOLECYSTECTOMY    . CIRCUMCISION N/A 06/09/2019   Procedure: CIRCUMCISION ADULT;  Surgeon: Billey Co, MD;  Location: [facility] ORS;  Service: Urology;  Laterality: N/A;  . COLONOSCOPY WITH PROPOFOL N/A 04/01/2018   Procedure:  COLONOSCOPY WITH PROPOFOL;  Surgeon: Manya Silvas, MD;  Location: [facility] ENDOSCOPY;  Service: Endoscopy;  Laterality: N/A;  . ESOPHAGEAL DILATION    . ESOPHAGOGASTRODUODENOSCOPY (EGD) WITH PROPOFOL N/A 04/01/2018   Procedure: ESOPHAGOGASTRODUODENOSCOPY (EGD) WITH PROPOFOL;  Surgeon: Manya Silvas, MD;  Location: [facility] ENDOSCOPY;  Service: Endoscopy;  Laterality: N/A;  . TONSILLECTOMY    . VASCULAR SURGERY      Prior to Admission medications   Medication Sig Start Date End Date Taking? Authorizing Provider  Albuterol Sulfate, sensor, 108 (90 Base) MCG/ACT AEPB Inhale 2 puffs into the lungs at bedtime. AND PRN    [provider]  allopurinol (ZYLOPRIM) 300 MG tablet Take 1 tablet (300 mg total) by mouth 2 (two) times daily. 10/05/18   Birdie Sons, MD  ALPRAZolam (XANAX) 1 MG tablet TAKE ONE-HALF TO ONE TABLET BY MOUTH THREE TIMES A DAY AS NEEDED Patient taking differently: Take 1 mg by mouth 3 (three) times daily.  02/23/19   Birdie Sons, MD  apixaban (ELIQUIS) 5 MG TABS tablet Take 5 mg by mouth 2 (two)  times daily.     [provider]  aspirin 81 MG chewable tablet Chew 81 mg by mouth daily.    [provider]  atorvastatin (LIPITOR) 80 MG tablet TAKE ONE TABLET BY MOUTH AT BEDTIME. Patient taking differently: Take 80 mg by mouth at bedtime.  09/19/18   Birdie Sons, MD  Blood Glucose Monitoring Suppl (ACCU-CHEK AVIVA PLUS) w/Device KIT CHECK BLOOD SUGAR EVERY DAY Patient not taking: Reported on 05/31/2019 12/08/17   Birdie Sons, MD  cetirizine (ZYRTEC) 10 MG tablet Take 10 mg by mouth at bedtime.     [provider]  Cholecalciferol (VITAMIN D3) 125 MCG (5000 UT) CAPS Take 5,000 Units by mouth daily.    [provider]  dapagliflozin propanediol (FARXIGA) 10 MG TABS tablet Take 10 mg by mouth daily. Patient taking differently: Take 10 mg by mouth every morning.  05/29/19   Birdie Sons, MD  docusate sodium (COLACE) 100 MG  capsule Take 100 mg by mouth daily.     [provider]  fluticasone furoate-vilanterol (BREO ELLIPTA) 100-25 MCG/INH AEPB Inhale 1 puff into the lungs every morning.     [provider]  glucose blood (ACCU-CHEK AVIVA PLUS) test strip Use to check blood sugar once a day Patient not taking: Reported on 05/31/2019 02/14/18   Birdie Sons, MD  isosorbide mononitrate (IMDUR) 120 MG 24 hr tablet TAKE 1 TABLET BY MOUTH DAILY Patient taking differently: Take 120 mg by mouth every morning.  01/19/19   Birdie Sons, MD  LINZESS 72 MCG capsule Take 72 mcg by mouth daily before breakfast.     [provider]  lisinopril (PRINIVIL,ZESTRIL) 2.5 MG tablet TAKE 1 TABLET BY MOUTH DAILY Patient taking differently: Take 2.5 mg by mouth every morning.  10/20/18   Birdie Sons, MD  magnesium oxide (MAG-OX) 400 (241.3 Mg) MG tablet TAKE 1 TABLET BY MOUTH DAILY Patient taking differently: Take 400 mg by mouth every morning.  10/28/18   Alisa Graff, FNP  methocarbamol (ROBAXIN) 500 MG tablet Take 1 tablet (500 mg total) by mouth 4 (four) times daily. 05/29/19   Birdie Sons, MD  nitroGLYCERIN (NITROSTAT) 0.4 MG SL tablet Place 1 tablet (0.4 mg total) under the tongue every 5 (five) minutes as needed for chest pain. Reported on 05/26/2016 11/28/18   Birdie Sons, MD  omega-3 acid ethyl esters (LOVAZA) 1 g capsule TAKE FOUR CAPSULES BY MOUTH DAILY Patient taking differently: Take 4 g by mouth daily.  06/24/18   Birdie Sons, MD  omeprazole (PRILOSEC) 40 MG capsule Take 40 mg by mouth 2 (two) times a day.  04/24/19   [provider]  oxyCODONE-acetaminophen (PERCOCET) 10-325 MG tablet Take 1 tablet by mouth every 4 (four) hours as needed. Patient taking differently: Take 1 tablet by mouth every 4 (four) hours.  05/21/19   Mar Daring, PA-C  oxyCODONE-acetaminophen (PERCOCET) 10-325 MG tablet Take 1 tablet by mouth every 6 (six) hours as needed for up to 3 days for  pain. 06/09/19 06/12/19  Billey Co, MD  OXYGEN Inhale 2 L into the lungs continuous.     [provider]  sotalol (BETAPACE) 80 MG tablet Take 80 mg by mouth 2 (two) times daily.  10/05/18   [provider]  tiotropium (SPIRIVA HANDIHALER) 18 MCG inhalation capsule Place 1 capsule (18 mcg total) into inhaler and inhale daily. Patient taking differently: Place 18 mcg into inhaler and inhale every  morning.  02/20/19   Birdie Sons, MD  torsemide (DEMADEX) 100 MG tablet Take 50 mg by mouth daily as needed (for fluid).  10/11/17   [provider]  traZODone (DESYREL) 100 MG tablet Take 1 tablet (100 mg total) by mouth at bedtime. For sleep 05/15/19   Chrismon, Vickki Muff, PA  venlafaxine XR (EFFEXOR-XR) 75 MG 24 hr capsule Take 1 capsule (75 mg total) by mouth daily with breakfast. 04/04/19   Birdie Sons, MD    Allergies Demerol  [meperidine hcl], Demerol [meperidine], Jardiance [empagliflozin], Prednisone, Sulfa antibiotics, Albuterol sulfate [albuterol], and Morphine sulfate  Family History  Problem Relation Age of Onset  . Heart attack Mother   . Depression Mother   . Heart disease Mother   . COPD Mother   . Hypertension Mother   . Heart attack Father   . Diabetes Father   . Depression Father   . Heart disease Father   . Cirrhosis Father   . Parkinson's disease Brother     Social History Social History   Tobacco Use  . Smoking status: Former Smoker    Packs/day: 3.00    Years: 50.00    Pack years: 150.00    Types: Cigarettes    Quit date: 04/22/2013    Years since quitting: 6.1  . Smokeless tobacco: Never Used  Substance Use Topics  . Alcohol use: No    Comment: remotely quit alcohol use. Hx of heavy alcohol use.  . Drug use: No     Review of Systems  Constitutional: No fever/chills Eyes: No visual changes. No discharge ENT: No upper respiratory complaints. Cardiovascular: Positive for left-sided chest pain radiating down the left  arm. Respiratory: no cough. No SOB. Gastrointestinal: No abdominal pain.  No nausea, no vomiting.  No diarrhea.  No constipation. Genitourinary: Negative for dysuria. No hematuria.  Partial circumcision yesterday, patient reports mild drainage from the area.  No active bleeding.  No significant pain. Musculoskeletal: Negative for musculoskeletal pain. Skin: Negative for rash, abrasions, lacerations, ecchymosis. Neurological: Negative for headaches, focal weakness or numbness. 10-point ROS otherwise negative.  ____________________________________________   PHYSICAL EXAM:  VITAL SIGNS: ED Triage Vitals  Enc Vitals Group     BP      Pulse      Resp      Temp      Temp src      SpO2      Weight      Height      Head Circumference      Peak Flow      Pain Score      Pain Loc      Pain Edu?      Excl. in [place]?      Constitutional: Alert and oriented. Well appearing and in no acute distress. Eyes: Conjunctivae are normal. PERRL. EOMI. Head: Atraumatic. ENT:      Ears:       Nose: No congestion/rhinnorhea.      Mouth/Throat: Mucous membranes are moist.  Neck: No stridor.    Cardiovascular: Normal rate, regular rhythm. Normal S1 and S2.  Good peripheral circulation. Respiratory: Normal respiratory effort without tachypnea or retractions. Lungs CTAB. Good air entry to the bases with no decreased or absent breath sounds. Gastrointestinal: Bowel sounds 4 quadrants. Soft and nontender to palpation. No guarding or rigidity. No palpable masses. No distention. Musculoskeletal: Full range of motion to all extremities. No gross deformities appreciated. Neurologic:  Normal speech and language. No gross  focal neurologic deficits are appreciated.  Skin:  Skin is warm, dry and intact. No rash noted. Psychiatric: Mood and affect are normal. Speech and behavior are normal. Patient exhibits appropriate insight and judgement.   ____________________________________________   LABS (all labs  ordered are listed, but only abnormal results are displayed)  Labs Reviewed  CBC - Abnormal; Notable for the following components:      Result Value   RDW 15.9 (*)    All other components within normal limits  SARS CORONAVIRUS 2 (HOSPITAL ORDER, [place] [facility])  BASIC METABOLIC PANEL  BRAIN NATRIURETIC PEPTIDE  TROPONIN I (HIGH SENSITIVITY)   ____________________________________________  EKG   ____________________________________________  RADIOLOGY I personally viewed and evaluated these images as part of my medical decision making, as well as reviewing the written report by the radiologist.  Dg Chest Port 1 View  Result Date: 06/10/2019 CLINICAL DATA:  63 year old male with chest pain. EXAM: PORTABLE CHEST 1 VIEW COMPARISON:  Chest radiograph dated 12/14/2018 FINDINGS: Diffuse interstitial coarsening and chronic bronchitic changes. There is stable cardiomegaly with multi vessel coronary vascular calcification and postsurgical changes of CABG. There is vascular congestion and probable mild edema. No pleural effusion, focal consolidation, or pneumothorax. No acute osseous pathology. IMPRESSION: Cardiomegaly with vascular congestion and possible mild interstitial edema. No focal consolidation. Electronically Signed   By: Anner Crete M.D.   On: 06/10/2019 20:50    ____________________________________________    PROCEDURES  Procedure(s) performed:    Procedures    Medications  aspirin chewable tablet 324 mg (324 mg Oral Given 06/10/19 2055)     ____________________________________________   INITIAL IMPRESSION / ASSESSMENT AND PLAN / ED COURSE  Pertinent labs & imaging results that were available during my care of the patient were reviewed by me and considered in my medical decision making (see chart for details).  Review of the [place] CSRS was performed in accordance of the [place] prior to dispensing any controlled drugs.  Clinical Course as of  Jun 09 2110  Sat Jun 10, 2019  2019 Patient presents emergency department complaining of left-sided chest pain radiating down the left arm.  Patient recently had a partial circumcision yesterday.  Patient reports that he went into A. fib at that time.  Patient has had chest pain for approximately 30 hours at this time.  Patient did take 3 nitro glycerin tablets prior to arrival.  Patient typically is on baby aspirin plus Eliquis daily.  This was stopped for surgery.  Patient is currently reporting that chest pain is consistent with previous MI.  At this time patient will be evaluated labs, EKG, chest x-ray.   [JC]    Clinical Course User Index [JC] , Charline Bills, PA-C        Patient presented to the emergency department complaining of left-sided chest pain with radiation down the left arm.  Patient reports that this is consistent with his previous MI.  At this time, patient care will be transferred to attending provider, Dr. Jimmye Norman pending final impression and disposition. ____________________________________________     This chart was dictated using voice recognition software/Dragon. Despite best efforts to proofread, errors can occur which can change the meaning. Any change was purely unintentional.    Darletta Moll, PA-C 06/10/19 2112    Earleen Newport, MD 06/10/19 2138

## 2019-06-10 NOTE — ED Provider Notes (Signed)
Patient was seen and examined by me, had symptoms like prior MI.  First troponin is negative.  We also gave Lasix due to some congestion seen on chest x-ray.  He will need full cardiac work-up, I will discuss with the hospitalist for admission.   Earleen Newport, MD 06/10/19 2203

## 2019-06-10 NOTE — ED Triage Notes (Signed)
Per EMS report, patient had a partial circumcision yesterday and went into afib during surgery and has had constant left-side chest pressure since 1100am yesterday. Patient took 3 Nitro without relief. Patient also c/o tingling in left arm.

## 2019-06-10 NOTE — ED Notes (Signed)
Report given to Jenna RN

## 2019-06-10 NOTE — ED Notes (Signed)
Admitting MD at bedside.

## 2019-06-11 ENCOUNTER — Observation Stay
Admit: 2019-06-11 | Discharge: 2019-06-11 | Disposition: A | Discharge status: Home / Self Care | Payer: Medicare HMO | Attending: Internal Medicine | Admitting: Internal Medicine

## 2019-06-11 DIAGNOSIS — I2510 Atherosclerotic heart disease of native coronary artery without angina pectoris: Secondary | ICD-10-CM | POA: Diagnosis not present

## 2019-06-11 DIAGNOSIS — I10 Essential (primary) hypertension: Secondary | ICD-10-CM | POA: Diagnosis not present

## 2019-06-11 DIAGNOSIS — R079 Chest pain, unspecified: Secondary | ICD-10-CM | POA: Diagnosis not present

## 2019-06-11 DIAGNOSIS — I25110 Atherosclerotic heart disease of native coronary artery with unstable angina pectoris: Secondary | ICD-10-CM | POA: Diagnosis not present

## 2019-06-11 DIAGNOSIS — E119 Type 2 diabetes mellitus without complications: Secondary | ICD-10-CM | POA: Diagnosis not present

## 2019-06-11 LAB — GLUCOSE, CAPILLARY
Glucose-Capillary: 145 mg/dL — ABNORMAL HIGH (ref 70–99)
Glucose-Capillary: 165 mg/dL — ABNORMAL HIGH (ref 70–99)
Glucose-Capillary: 168 mg/dL — ABNORMAL HIGH (ref 70–99)
Glucose-Capillary: 169 mg/dL — ABNORMAL HIGH (ref 70–99)
Glucose-Capillary: 190 mg/dL — ABNORMAL HIGH (ref 70–99)

## 2019-06-11 LAB — CBC
HCT: 52.6 % — ABNORMAL HIGH (ref 39.0–52.0)
Hemoglobin: 16.4 g/dL (ref 13.0–17.0)
MCH: 26.9 pg (ref 26.0–34.0)
MCHC: 31.2 g/dL (ref 30.0–36.0)
MCV: 86.2 fL (ref 80.0–100.0)
Platelets: 192 10*3/uL (ref 150–400)
RBC: 6.1 MIL/uL — ABNORMAL HIGH (ref 4.22–5.81)
RDW: 16.4 % — ABNORMAL HIGH (ref 11.5–15.5)
WBC: 6.8 10*3/uL (ref 4.0–10.5)
nRBC: 0 % (ref 0.0–0.2)

## 2019-06-11 LAB — BASIC METABOLIC PANEL
Anion gap: 7 (ref 5–15)
BUN: 16 mg/dL (ref 8–23)
CO2: 33 mmol/L — ABNORMAL HIGH (ref 22–32)
Calcium: 9 mg/dL (ref 8.9–10.3)
Chloride: 99 mmol/L (ref 98–111)
Creatinine, Ser: 0.99 mg/dL (ref 0.61–1.24)
GFR calc Af Amer: >60 mL/min (ref >60)
GFR calc non Af Amer: >60 mL/min (ref >60)
Glucose, Bld: 160 mg/dL — ABNORMAL HIGH (ref 70–99)
Potassium: 3.9 mmol/L (ref 3.5–5.1)
Sodium: 139 mmol/L (ref 135–145)

## 2019-06-11 MED ORDER — TIOTROPIUM BROMIDE MONOHYDRATE 18 MCG IN CAPS
18.0000 ug | ORAL_CAPSULE | RESPIRATORY_TRACT | Status: DC
  Administered 2019-06-11: 18 ug via RESPIRATORY_TRACT
  Filled 2019-06-11: qty 5

## 2019-06-11 MED ORDER — SODIUM CHLORIDE 0.9% FLUSH
3.0000 mL | Freq: Two times a day (BID) | INTRAVENOUS | Status: DC
  Administered 2019-06-11 (×2): 3 mL via INTRAVENOUS

## 2019-06-11 MED ORDER — FLUTICASONE FUROATE-VILANTEROL 100-25 MCG/INH IN AEPB
1.0000 | INHALATION_SPRAY | RESPIRATORY_TRACT | Status: DC
  Administered 2019-06-11: 1 via RESPIRATORY_TRACT
  Filled 2019-06-11: qty 28

## 2019-06-11 MED ORDER — ONDANSETRON HCL 4 MG/2ML IJ SOLN
4.0000 mg | Freq: Four times a day (QID) | INTRAMUSCULAR | Status: DC | PRN
  Administered 2019-06-11: 4 mg via INTRAVENOUS
  Filled 2019-06-11: qty 2

## 2019-06-11 MED ORDER — ACETAMINOPHEN 325 MG PO TABS: 650.0000 mg | ORAL_TABLET | Freq: Four times a day (QID) | ORAL | Status: DC | PRN

## 2019-06-11 MED ORDER — APIXABAN 5 MG PO TABS: 5.0000 mg | ORAL_TABLET | Freq: Two times a day (BID) | ORAL | Status: DC

## 2019-06-11 MED ORDER — INSULIN ASPART 100 UNIT/ML ~~LOC~~ SOLN
0.0000 [IU] | Freq: Four times a day (QID) | SUBCUTANEOUS | Status: DC
  Administered 2019-06-11 – 2019-06-12 (×6): 2 [IU] via SUBCUTANEOUS
  Filled 2019-06-11 (×6): qty 1

## 2019-06-11 MED ORDER — ASPIRIN 81 MG PO CHEW
81.0000 mg | CHEWABLE_TABLET | Freq: Every day | ORAL | Status: DC
  Administered 2019-06-11 – 2019-06-12 (×2): 81 mg via ORAL
  Filled 2019-06-11 (×2): qty 1

## 2019-06-11 MED ORDER — PANTOPRAZOLE SODIUM 40 MG PO TBEC
40.0000 mg | DELAYED_RELEASE_TABLET | Freq: Every day | ORAL | Status: DC
  Administered 2019-06-11 – 2019-06-12 (×2): 40 mg via ORAL
  Filled 2019-06-11 (×2): qty 1

## 2019-06-11 MED ORDER — ACETAMINOPHEN 650 MG RE SUPP: 650.0000 mg | Freq: Four times a day (QID) | RECTAL | Status: DC | PRN

## 2019-06-11 MED ORDER — OXYCODONE HCL 5 MG PO TABS
5.0000 mg | ORAL_TABLET | ORAL | Status: DC | PRN
  Administered 2019-06-11 – 2019-06-12 (×6): 5 mg via ORAL
  Filled 2019-06-11 (×6): qty 1

## 2019-06-11 MED ORDER — MORPHINE SULFATE (PF) 2 MG/ML IV SOLN
2.0000 mg | INTRAVENOUS | Status: DC | PRN
  Administered 2019-06-11 – 2019-06-12 (×8): 2 mg via INTRAVENOUS
  Filled 2019-06-11 (×7): qty 1

## 2019-06-11 MED ORDER — MORPHINE SULFATE (PF) 2 MG/ML IV SOLN
1.0000 mg | Freq: Once | INTRAVENOUS | Status: AC
  Administered 2019-06-11: 1 mg via INTRAVENOUS
  Filled 2019-06-11: qty 1

## 2019-06-11 MED ORDER — ALPRAZOLAM 0.5 MG PO TABS
0.5000 mg | ORAL_TABLET | Freq: Three times a day (TID) | ORAL | Status: DC | PRN
  Administered 2019-06-11: 1 mg via ORAL
  Administered 2019-06-11: 0.5 mg via ORAL
  Administered 2019-06-12: 1 mg via ORAL
  Filled 2019-06-11: qty 2
  Filled 2019-06-11: qty 1
  Filled 2019-06-11: qty 2

## 2019-06-11 MED ORDER — ATORVASTATIN CALCIUM 80 MG PO TABS
80.0000 mg | ORAL_TABLET | Freq: Every day | ORAL | Status: DC
  Administered 2019-06-11 (×2): 80 mg via ORAL
  Filled 2019-06-11 (×2): qty 1

## 2019-06-11 MED ORDER — ONDANSETRON HCL 4 MG PO TABS: 4.0000 mg | ORAL_TABLET | Freq: Four times a day (QID) | ORAL | Status: DC | PRN

## 2019-06-11 MED ORDER — LABETALOL HCL 5 MG/ML IV SOLN
5.0000 mg | INTRAVENOUS | Status: DC | PRN
  Administered 2019-06-11 – 2019-06-12 (×2): 5 mg via INTRAVENOUS
  Filled 2019-06-11 (×2): qty 4

## 2019-06-11 NOTE — Care Management Obs Status (Signed)
[facility] NOTIFICATION   Patient Details  Name: Lawrence [name] MRN: [id number] Date of Birth: 1956-03-07   Medicare Observation Status Notification Given:  Yes    Keygan Dumond A Beecher Furio, RN 06/11/2019, 8:28 AM

## 2019-06-11 NOTE — Consult Note (Signed)
[place] Nurse wound consult note Reason for Consult:consult received for after care following uncomplicated penile dorsal slit procedure in OR by Urology (Dr. Diamantina Providence) on 06/09/19. Wound type:Surgical Pressure Injury POA: NA Dressing procedure/placement/frequency: Dr. Diamantina Providence place bacitracin and a dry gauze 2x2 immediately post operatively and did not indicate that further dressing changes are required.  Twice daily gentle cleansing is recommended and to leave area open to air.  Patient is to follow up with Urology in 1 month. If there are further questions or concerns, patient is to contact Urology  [place] nursing team will not follow, but will remain available to this patient, the nursing and medical teams.  Please re-consult if needed. Thanks, Maudie Flakes, MSN, RN, [place], Arther Abbott  Pager# [phone]

## 2019-06-11 NOTE — Progress Notes (Signed)
[place] at [facility]                                                                                                                                                                                  Patient Demographics   Lawrence [name], is a 63 y.o. male, DOB - [birth year], JAS:[id number]  Admit date - 06/10/2019   Admitting Physician Lance Coon, MD  Outpatient Primary MD for the patient is Birdie Sons, MD   LOS - 0  Subjective: Patient admitted with chest pain states that last time he was admitted was 2017 continue to have chest pain now states only thing that is working is the morphine    Review of Systems:   CONSTITUTIONAL: No documented fever. No fatigue, weakness. No weight gain, no weight loss.  EYES: No blurry or double vision.  ENT: No tinnitus. No postnasal drip. No redness of the oropharynx.  RESPIRATORY: No cough, no wheeze, no hemoptysis. No dyspnea.  CARDIOVASCULAR: Positive chest pain. No orthopnea. No palpitations. No syncope.  GASTROINTESTINAL: No nausea, no vomiting or diarrhea. No abdominal pain. No melena or hematochezia.  GENITOURINARY: No dysuria or hematuria.  ENDOCRINE: No polyuria or nocturia. No heat or cold intolerance.  HEMATOLOGY: No anemia. No bruising. No bleeding.  INTEGUMENTARY: No rashes. No lesions.  MUSCULOSKELETAL: No arthritis. No swelling. No gout.  NEUROLOGIC: No numbness, tingling, or ataxia. No seizure-type activity.  PSYCHIATRIC: No anxiety. No insomnia. No ADD.    Vitals:   Vitals:   06/11/19 0104 06/11/19 0348 06/11/19 0739 06/11/19 1148  BP: (!) 145/91 (!) 159/74 (!) 146/100   Pulse: 68 85 70   Resp:  16 19   Temp:  98.9 F (37.2 C) 97.9 F (36.6 C)   TempSrc:  Oral Oral   SpO2:  94% 99% 99%  Weight:  106.9 kg    Height:        Wt Readings from Last 3 Encounters:  06/11/19 106.9 kg  06/06/19 109.7 kg  05/29/19 111.5 kg     Intake/Output Summary (Last 24 hours) at 06/11/2019  1306 Last data filed at 06/11/2019 1208 Gross per 24 hour  Intake 3 ml  Output 3800 ml  Net -3797 ml    Physical Exam:   GENERAL: Pleasant-appearing in no apparent distress.  HEAD, EYES, EARS, NOSE AND THROAT: Atraumatic, normocephalic. Extraocular muscles are intact. Pupils equal and reactive to light. Sclerae anicteric. No conjunctival injection. No oro-pharyngeal erythema.  NECK: Supple. There is no jugular venous distention. No bruits, no lymphadenopathy, no thyromegaly.  HEART: Regular rate and rhythm,. No murmurs, no rubs, no clicks.  LUNGS: Clear to auscultation bilaterally. No rales or  rhonchi. No wheezes.  ABDOMEN: Soft, flat, nontender, nondistended. Has good bowel sounds. No hepatosplenomegaly appreciated.  EXTREMITIES: No evidence of any cyanosis, clubbing, or peripheral edema.  +2 pedal and radial pulses bilaterally.  NEUROLOGIC: The patient is alert, awake, and oriented x3 with no focal motor or sensory deficits appreciated bilaterally.  SKIN: Moist and warm with no rashes appreciated.  Psych: Not anxious, depressed LN: No inguinal LN enlargement    Antibiotics   Anti-infectives (From admission, onward)   None      Medications   Scheduled Meds: . aspirin  81 mg Oral Daily  . atorvastatin  80 mg Oral QHS  . fluticasone furoate-vilanterol  1 puff Inhalation BH-q7a  . insulin aspart  0-9 Units Subcutaneous Q6H  . pantoprazole  40 mg Oral Daily  . sodium chloride flush  3 mL Intravenous Q12H  . tiotropium  18 mcg Inhalation BH-q7a   Continuous Infusions: PRN Meds:.acetaminophen **OR** acetaminophen, ALPRAZolam, morphine injection, [COMPLETED] nitroGLYCERIN **FOLLOWED BY** nitroGLYCERIN, ondansetron **OR** ondansetron (ZOFRAN) IV, oxyCODONE   Data Review:   Micro Results Recent Results (from the past 240 hour(s))  SARS Coronavirus 2 (Performed in [place] hospital lab)     Status: None   Collection Time: 06/05/19  8:30 AM   Specimen: Nasal Swab  Result  Value Ref Range Status   SARS Coronavirus 2 NEGATIVE NEGATIVE Final    Comment: (NOTE) SARS-CoV-2 target nucleic acids are NOT DETECTED. The SARS-CoV-2 RNA is generally detectable in upper and lower respiratory specimens during the acute phase of infection. Negative results do not preclude SARS-CoV-2 infection, do not rule out co-infections with other pathogens, and should not be used as the sole basis for treatment or other patient management decisions. Negative results must be combined with clinical observations, patient history, and epidemiological information. The expected result is Negative. Fact Sheet for Patients: SugarRoll.be Fact Sheet for Healthcare Providers: https://www.woods-mathews.com/ This test is not yet approved or cleared by the [place] FDA and  has been authorized for detection and/or diagnosis of SARS-CoV-2 by FDA under an Emergency Use Authorization (EUA). This EUA will remain  in effect (meaning this test can be used) for the duration of the COVID-19 declaration under Section 56 4(b)(1) of the Act, 21 U.S.C. section 360bbb-3(b)(1), unless the authorization is terminated or revoked sooner. Performed at [facility], [place] [address], [place], [place] [address]   SARS Coronavirus 2 (CEPHEID - Performed in [place] hospital lab), Hosp Order     Status: None   Collection Time: 06/10/19  8:54 PM   Specimen: Nasopharyngeal Swab  Result Value Ref Range Status   SARS Coronavirus 2 NEGATIVE NEGATIVE Final    Comment: (NOTE) If result is NEGATIVE SARS-CoV-2 target nucleic acids are NOT DETECTED. The SARS-CoV-2 RNA is generally detectable in upper and lower  respiratory specimens during the acute phase of infection. The lowest  concentration of SARS-CoV-2 viral copies this assay can detect is 250  copies / mL. A negative result does not preclude SARS-CoV-2 infection  and should not be used as the sole basis for  treatment or other  patient management decisions.  A negative result may occur with  improper specimen collection / handling, submission of specimen other  than nasopharyngeal swab, presence of viral mutation(s) within the  areas targeted by this assay, and inadequate number of viral copies  (<250 copies / mL). A negative result must be combined with clinical  observations, patient history, and epidemiological information. If result is POSITIVE  SARS-CoV-2 target nucleic acids are DETECTED. The SARS-CoV-2 RNA is generally detectable in upper and lower  respiratory specimens dur ing the acute phase of infection.  Positive  results are indicative of active infection with SARS-CoV-2.  Clinical  correlation with patient history and other diagnostic information is  necessary to determine patient infection status.  Positive results do  not rule out bacterial infection or co-infection with other viruses. If result is PRESUMPTIVE POSTIVE SARS-CoV-2 nucleic acids MAY BE PRESENT.   A presumptive positive result was obtained on the submitted specimen  and confirmed on repeat testing.  While 2019 novel coronavirus  (SARS-CoV-2) nucleic acids may be present in the submitted sample  additional confirmatory testing may be necessary for epidemiological  and / or clinical management purposes  to differentiate between  SARS-CoV-2 and other Sarbecovirus currently known to infect humans.  If clinically indicated additional testing with an alternate test  methodology [phone]) is advised. The SARS-CoV-2 RNA is generally  detectable in upper and lower respiratory sp ecimens during the acute  phase of infection. The expected result is Negative. Fact Sheet for Patients:  StrictlyIdeas.no Fact Sheet for Healthcare Providers: BankingDealers.co.za This test is not yet approved or cleared by the [place] FDA and has been authorized for detection and/or  diagnosis of SARS-CoV-2 by FDA under an Emergency Use Authorization (EUA).  This EUA will remain in effect (meaning this test can be used) for the duration of the COVID-19 declaration under Section 564(b)(1) of the Act, 21 U.S.C. section 360bbb-3(b)(1), unless the authorization is terminated or revoked sooner. Performed at [facility], [address], [place], [place] [address]     Radiology Reports Dg Chest [place] 1 View  Result Date: 06/10/2019 CLINICAL DATA:  63 year old male with chest pain. EXAM: PORTABLE CHEST 1 VIEW COMPARISON:  Chest radiograph dated 12/14/2018 FINDINGS: Diffuse interstitial coarsening and chronic bronchitic changes. There is stable cardiomegaly with multi vessel coronary vascular calcification and postsurgical changes of CABG. There is vascular congestion and probable mild edema. No pleural effusion, focal consolidation, or pneumothorax. No acute osseous pathology. IMPRESSION: Cardiomegaly with vascular congestion and possible mild interstitial edema. No focal consolidation. Electronically Signed   By: Anner Crete M.D.   On: 06/10/2019 20:50     CBC Recent Labs  Lab 06/10/19 2028 06/11/19 0433  WBC 6.1 6.8  HGB 14.2 16.4  HCT 45.8 52.6*  PLT 163 192  MCV 85.3 86.2  MCH 26.4 26.9  MCHC 31.0 31.2  RDW 15.9* 16.4*    Chemistries  Recent Labs  Lab 06/10/19 2028 06/11/19 0433  NA 139 139  K 4.1 3.9  CL 104 99  CO2 30 33*  GLUCOSE 167* 160*  BUN 16 16  CREATININE 0.86 0.99  CALCIUM 8.7* 9.0   ------------------------------------------------------------------------------------------------------------------ estimated creatinine clearance is 86.7 mL/min (by C-G formula based on SCr of 0.99 mg/dL). ------------------------------------------------------------------------------------------------------------------ No results for input(s): HGBA1C in the last 72  hours. ------------------------------------------------------------------------------------------------------------------ No results for input(s): CHOL, HDL, LDLCALC, TRIG, CHOLHDL, LDLDIRECT in the last 72 hours. ------------------------------------------------------------------------------------------------------------------ No results for input(s): TSH, T4TOTAL, T3FREE, THYROIDAB in the last 72 hours.  Invalid input(s): FREET3 ------------------------------------------------------------------------------------------------------------------ No results for input(s): VITAMINB12, FOLATE, FERRITIN, TIBC, IRON, RETICCTPCT in the last 72 hours.  Coagulation profile No results for input(s): INR, PROTIME in the last 168 hours.  No results for input(s): DDIMER in the last 72 hours.  Cardiac Enzymes No results for input(s): CKMB, TROPONINI, MYOGLOBIN in the last 168 hours.  Invalid input(s): CK ------------------------------------------------------------------------------------------------------------------ Invalid  input(s): POCBNP    Assessment & Plan  Patient 63 year old presenting with chest pain with history of coronary artery disease    Chest pain - Due to unstable angina Discussed with cardiology plan for cardiac cath tomorrow Continue aspirin therapy  CAD (coronary artery disease) -continue home meds, other work-up as above  HTN (hypertension) -home dose antihypertensives  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HCC) -sliding scale insulin coverage  COPD (chronic obstructive pulmonary disease) (HCC) -home dose inhalers  Chronic diastolic CHF (congestive heart failure) ([place]) -continue home meds  Paroxysmal atrial fibrillation ([place]) -continue home medications for rate control.  We will hold his Eliquis for now as the patient states that he just had a circumcision procedure performed and has been told not to restart this until Monday.  GERD (gastroesophageal reflux  disease) -home dose PPI  HLD (hyperlipidemia) -home dose antilipid  OSA (obstructive sleep apnea) -CPAP nightly     Code Status Orders  (From admission, onward)         Start     Ordered   06/11/19 0001  Full code  Continuous     06/11/19 0000        Code Status History    Date Active Date Inactive Code Status Order ID Comments User Context   12/14/2018 2323 12/16/2018 1505 Full Code [id number]  [name], MD Inpatient   04/18/2018 1546 04/19/2018 1831 Full Code [id number]  Saundra Shelling, MD Inpatient   10/01/2017 0053 10/01/2017 1758 Full Code [id number]  Lance Coon, MD Inpatient   10/29/2016 2222 11/03/2016 1618 Full Code [id number]  Lance Coon, MD Inpatient   09/03/2016 0143 09/03/2016 1846 Full Code [id number]  Lance Coon, MD ED   06/25/2016 0036 06/26/2016 1535 Full Code [id number]  Lance Coon, MD ED   04/03/2016 2055 04/07/2016 1759 Full Code [id number]  Theodoro Grist, MD Inpatient   03/26/2016 2224 03/28/2016 1800 Full Code [id number]  Lance Coon, MD Inpatient   10/07/2015 0936 10/07/2015 1830 Full Code [id number]  Dionisio David, MD Inpatient   10/07/2015 0826 10/07/2015 0936 Full Code [id number]  Dionisio David, MD Inpatient   10/05/2015 1929 10/07/2015 0826 Full Code [id number]  Idelle Crouch, MD Inpatient   09/12/2015 2129 09/14/2015 1425 Full Code [id number]  Demetrios Loll, MD Inpatient   08/01/2015 0521 08/02/2015 1346 Full Code [id number]  Lance Coon, MD Inpatient   07/12/2015 1524 07/13/2015 2017 Full Code [id number]  Belva Crome, MD Inpatient   07/11/2015 2000 07/12/2015 1524 Full Code [id number]  Reola Mosher Inpatient   06/26/2015 0543 06/27/2015 1530 Full Code [id number]  Juluis Mire, MD Inpatient   Advance Care Planning Activity           Consults cardiology   DVT Prophylaxis SCDs  Lab Results  Component Value Date   PLT 192 06/11/2019     Time Spent in minutes 35 minutes greater than 50% of time spent in care coordination and  counseling patient regarding the condition and plan of care.   Dustin Flock M.D on 06/11/2019 at 1:06 PM  Between 7am to 6pm - Pager - [phone]  After 6pm go to [email or web address] - Proofreader  [employer]   Office  [phone]

## 2019-06-11 NOTE — Consult Note (Signed)
Cardiology Consultation Note    Patient ID: Lawrence [name], MRN: [id number], DOB/AGE: [birth year] 65 y.o. Admit date: 06/10/2019   Date of Consult: 06/11/2019 Primary Physician: Birdie Sons, MD Primary Cardiologist: Dr. [name]  Chief Complaint: chest pain Reason for Consultation: chest pain Requesting MD: Dr. Dustin Flock  HPI: Lawrence [name] is a 63 y.o. male with history of coronary artery disease status post myocardial infarction in 2002 with a subsequent coronary artery bypass grafting x2 with a left internal mammary to the LAD and a saphenous vein graft to the OM1.  In 2013 had a drug-eluting stent placed in his obtuse marginal.  In 2016 had a PTCA of the LAD in the setting of an atretic left internal mammary artery.  Cardiac catheterization in 2016 revealed normal left main, 45% stenosis in the LAD with small diagonals, 50% left circumflex, 70% ostial OM1, 50% ostial stenosis in the vein graft to the OM1.  With an atretic LIMA to the LAD which was chronic.  The RCA was dominant.  Cardiac cath in October 2016 revealed an EF of 40 to 50% with a 75% stenosis in the mid 9 LAD with an FFR of 0.77.  Underwent placement of a drug-eluting stent in the mid LAD.  The RCA had a 40% stenosis with an FFR of 0.95 which was treated medically.  He underwent a circumcision 3 days ago.  He presented to the emergency room with complaints of chest pain.  He states he was told he had atrial fibrillation during his surgery although documentation of this is not available.  He has been on Eliquis as an outpatient but has been off of this drug for several days prior to his surgery and was due to go back on it tomorrow.  He presented to emergency room with complaints of chest pain.  He had a functional study recently showing no reversible ischemia however he states his chest pain is similar to his angina and is how it usually feels when he needs a stent".  He is ruled out for myocardial infarction.  He has been  compliant with the remainder of his medications with the exception of the Eliquis which as mentioned before was held due to the urologic surgery.  He continues to have chest pain at rest.  High-sensitivity troponin was normal.  EKG showed sinus rhythm with no ischemia.  Echocardiogram at [facility] in December 2019 showed an EF of 55 to 60%.  There is aortic sclerosis but no stenosis.  He is on Imdur at 60 mg twice daily, lisinopril 5 mg daily, Ranexa 500 mg twice daily and torsemide as an outpatient.  He is also on sotalol at 80 mg twice daily for his atrial fibrillation and Eliquis at 5 mg daily for anticoagulation.  Past Medical History:  Diagnosis Date  . A-fib ([place])   . Anemia   . Anginal pain ([place])   . Anxiety   . Arthritis   . Asthma   . CAD (coronary artery disease)    a. 2002 CABGx2 (LIMA->LAD, VG->VG->OM1);  b. 09/2012 DES->OM;  c. 03/2015 PTCA of LAD [facility]) in setting of atretic LIMA; d. 05/2015 Cath [facility]): nonobs dzs; e. 06/2015 Cath ([facility]): LM nl, LAD 45p/d ISR, 50d, D1/2 small, LCX 50p/d ISR, OM1 70ost, 30 ISR, VG->OM1 50ost, 63m LIMA->LAD 99p/d - atretic, RCA dom, nl; f.cath 10/16: 40-50%(FFR 0.90) pLAD, 75% (FFR 0.77) mLAD s/p PCI/DES, oRCA 40% (FFR0.95)  . Cancer ([facility]    SKIN CANCER  ON BACK  . Celiac disease   . Chronic diastolic CHF (congestive heart failure) ([place])    a. 06/2009 Echo: EF 60-65%, Gr 1 DD, triv AI, mildly dil LA, nl RV.  Marland Kitchen COPD (chronic obstructive pulmonary disease) ([place])    a. Chronic bronchitis and emphysema.  . DDD (degenerative disc disease), lumbar   . Diverticulosis   . Dysrhythmia   . Essential hypertension   . GERD (gastroesophageal reflux disease)   . History of hiatal hernia   . History of kidney stones    H/O  . History of tobacco abuse    a. Quit 2014.  Marland Kitchen Myocardial infarction ([place]) 2002   4 STENTS  . Pancreatitis   . PSVT (paroxysmal supraventricular tachycardia) ([place])    a. 10/2012 Noted on Zio Patch.  . Sleep apnea    LOST CORD TO  CPAP -ONLY 02 @ BEDTIME  . Tubular adenoma of colon   . Type II diabetes mellitus ([place])       Surgical History:  Past Surgical History:  Procedure Laterality Date  . BYPASS GRAFT    . CARDIAC CATHETERIZATION N/A 07/12/2015   rocedure: Left Heart Cath and Cors/Grafts Angiography;  Surgeon: Belva Crome, MD;  Location: [place] [facility];  Service: Cardiovascular;  Laterality: N/A;  . CARDIAC CATHETERIZATION Right 10/07/2015   Procedure: Left Heart Cath and Cors/Grafts Angiography;  Surgeon: Dionisio David, MD;  Location: [place] [facility];  Service: Cardiovascular;  Laterality: Right;  . CARDIAC CATHETERIZATION N/A 04/06/2016   Procedure: Left Heart Cath and Coronary Angiography;  Surgeon: Yolonda Kida, MD;  Location: [place] [facility];  Service: Cardiovascular;  Laterality: N/A;  . CARDIAC CATHETERIZATION  04/06/2016   Procedure: Bypass Graft Angiography;  Surgeon: Yolonda Kida, MD;  Location: [place] [facility];  Service: Cardiovascular;;  . CARDIAC CATHETERIZATION N/A 11/02/2016   Procedure: Left Heart Cath and Cors/Grafts Angiography and possible PCI;  Surgeon: Yolonda Kida, MD;  Location: [place] [facility];  Service: Cardiovascular;  Laterality: N/A;  . CARDIAC CATHETERIZATION N/A 11/02/2016   Procedure: Coronary Stent Intervention;  Surgeon: Yolonda Kida, MD;  Location: [place] [facility];  Service: Cardiovascular;  Laterality: N/A;  . CHOLECYSTECTOMY    . CIRCUMCISION N/A 06/09/2019   Procedure: CIRCUMCISION ADULT;  Surgeon: Billey Co, MD;  Location: [facility] ORS;  Service: Urology;  Laterality: N/A;  . COLONOSCOPY WITH PROPOFOL N/A 04/01/2018   Procedure: COLONOSCOPY WITH PROPOFOL;  Surgeon: Manya Silvas, MD;  Location: [facility] ENDOSCOPY;  Service: Endoscopy;  Laterality: N/A;  . ESOPHAGEAL DILATION    . ESOPHAGOGASTRODUODENOSCOPY (EGD) WITH PROPOFOL N/A 04/01/2018   Procedure: ESOPHAGOGASTRODUODENOSCOPY (EGD) WITH PROPOFOL;  Surgeon: Manya Silvas, MD;  Location: [facility] ENDOSCOPY;  Service: Endoscopy;  Laterality: N/A;  . TONSILLECTOMY    . VASCULAR SURGERY       Home Meds: Prior to Admission medications   Medication Sig Start Date End Date Taking? Authorizing Provider  Albuterol Sulfate, sensor, 108 (90 Base) MCG/ACT AEPB Inhale 2 puffs into the lungs at bedtime. AND PRN   Yes [provider]  allopurinol (ZYLOPRIM) 300 MG tablet Take 1 tablet (300 mg total) by mouth 2 (two) times daily. 10/05/18  Yes Birdie Sons, MD  ALPRAZolam (XANAX) 1 MG tablet TAKE ONE-HALF TO ONE TABLET BY MOUTH THREE TIMES A DAY AS NEEDED Patient taking differently: Take 1 mg by mouth 3 (three) times daily.  02/23/19  Yes Birdie Sons, MD  apixaban (ELIQUIS) 5 MG TABS tablet Take 5 mg by mouth 2 (two) times daily.    Yes [provider]  aspirin 81 MG chewable tablet Chew 81 mg by mouth daily.   Yes [provider]  atorvastatin (LIPITOR) 80 MG tablet TAKE ONE TABLET BY MOUTH AT BEDTIME. Patient taking differently: Take 80 mg by mouth at bedtime.  09/19/18  Yes Birdie Sons, MD  Blood Glucose Monitoring Suppl (ACCU-CHEK AVIVA PLUS) w/Device KIT CHECK BLOOD SUGAR EVERY DAY 12/08/17  Yes Birdie Sons, MD  cetirizine (ZYRTEC) 10 MG tablet Take 10 mg by mouth at bedtime.    Yes [provider]  Cholecalciferol (VITAMIN D3) 125 MCG (5000 UT) CAPS Take 5,000 Units by mouth daily.   Yes [provider]  dapagliflozin propanediol (FARXIGA) 10 MG TABS tablet Take 10 mg by mouth daily. Patient taking differently: Take 10 mg by mouth every morning.  05/29/19  Yes Birdie Sons, MD  docusate sodium (COLACE) 100 MG capsule Take 100 mg by mouth daily.    Yes [provider]  fluticasone furoate-vilanterol (BREO ELLIPTA) 100-25 MCG/INH AEPB Inhale 1 puff into the lungs every morning.    Yes [provider]  glucose blood (ACCU-CHEK AVIVA PLUS) test strip Use to check blood sugar once a day  02/14/18  Yes [name], [name], MD  isosorbide mononitrate (IMDUR) 120 MG 24 hr tablet TAKE 1 TABLET BY MOUTH DAILY Patient taking differently: Take 120 mg by mouth every morning.  01/19/19  Yes Birdie Sons, MD  LINZESS 72 MCG capsule Take 72 mcg by mouth daily before breakfast.    Yes [provider]  lisinopril (PRINIVIL,ZESTRIL) 2.5 MG tablet TAKE 1 TABLET BY MOUTH DAILY Patient taking differently: Take 2.5 mg by mouth every morning.  10/20/18  Yes Birdie Sons, MD  magnesium oxide (MAG-OX) 400 (241.3 Mg) MG tablet TAKE 1 TABLET BY MOUTH DAILY Patient taking differently: Take 400 mg by mouth every morning.  10/28/18  Yes Hackney, Otila Kluver A, FNP  methocarbamol (ROBAXIN) 500 MG tablet Take 1 tablet (500 mg total) by mouth 4 (four) times daily. 05/29/19  Yes Birdie Sons, MD  nitroGLYCERIN (NITROSTAT) 0.4 MG SL tablet Place 1 tablet (0.4 mg total) under the tongue every 5 (five) minutes as needed for chest pain. Reported on 05/26/2016 11/28/18  Yes Birdie Sons, MD  omega-3 acid ethyl esters (LOVAZA) 1 g capsule TAKE FOUR CAPSULES BY MOUTH DAILY Patient taking differently: Take 4 g by mouth daily.  06/24/18  Yes Birdie Sons, MD  omeprazole (PRILOSEC) 40 MG capsule Take 40 mg by mouth 2 (two) times a day.  04/24/19  Yes [provider]  oxyCODONE-acetaminophen (PERCOCET) 10-325 MG tablet Take 1 tablet by mouth every 4 (four) hours as needed. Patient taking differently: Take 1 tablet by mouth every 4 (four) hours.  05/21/19  Yes Mar Daring, PA-C  OXYGEN Inhale 2 L into the lungs continuous.    Yes [provider]  tiotropium (SPIRIVA HANDIHALER) 18 MCG inhalation capsule Place 1 capsule (18 mcg total) into inhaler and inhale daily. Patient taking differently: Place 18 mcg into inhaler and inhale every morning.  02/20/19  Yes Birdie Sons, MD  torsemide (DEMADEX) 100 MG tablet Take 50 mg by mouth daily as needed (for fluid).  10/11/17  Yes [provider]  traZODone (DESYREL) 100 MG tablet Take 1 tablet (100 mg total) by mouth at bedtime. For sleep 05/15/19  Yes Chrismon, Vickki Muff, PA  venlafaxine XR (EFFEXOR-XR) 75 MG 24 hr capsule Take 1 capsule (75 mg total) by mouth daily with breakfast. 04/04/19  Yes Birdie Sons, MD  oxyCODONE-acetaminophen (PERCOCET) 10-325 MG tablet Take 1 tablet by mouth every 6 (six) hours as needed for up to 3 days for pain. Patient not taking: Reported on 06/10/2019 06/09/19 06/12/19  Billey Co, MD    Inpatient Medications:  . aspirin  81 mg Oral Daily  . atorvastatin  80 mg Oral QHS  . fluticasone furoate-vilanterol  1 puff Inhalation BH-q7a  . insulin aspart  0-9 Units Subcutaneous Q6H  . pantoprazole  40 mg Oral Daily  . sodium chloride flush  3 mL Intravenous Q12H  . tiotropium  18 mcg Inhalation BH-q7a     Allergies:  Allergies  Allergen Reactions  . Demerol  [Meperidine Hcl]   . Demerol [Meperidine] Hives  . Jardiance [Empagliflozin] Other (See Comments)    Perineal pain  . Prednisone Other (See Comments) and Hypertension    Pt states that this medication puts him in A-fib   . Sulfa Antibiotics Hives  . Albuterol Sulfate [Albuterol] Palpitations and Other (See Comments)    Pt currently uses this medication.    . Morphine Sulfate Nausea And Vomiting, Rash and Other (See Comments)    Pt states that he is only allergic to the tablet form of this medication.      Social History   Socioeconomic History  . Marital status: Married    Spouse name: Not on file  . Number of children: 1  . Years of education: Not on file  . Highest education level: 10th grade  Occupational History  . Occupation: Disabled  Social Needs  . Financial resource strain: Not hard at all  . Food insecurity    Worry: Never true    Inability: Never true  . Transportation needs    Medical: No    Non-medical: No  Tobacco Use  . Smoking status: Former Smoker    Packs/day: 3.00    Years: 50.00     Pack years: 150.00    Types: Cigarettes    Quit date: 04/22/2013    Years since quitting: 6.1  . Smokeless tobacco: Never Used  Substance and Sexual Activity  . Alcohol use: No    Comment: remotely quit alcohol use. Hx of heavy alcohol use.  . Drug use: No  . Sexual activity: Not on file  Lifestyle  . Physical activity    Days per week: Not on file    Minutes per session: Not on file  . Stress: Only a little  Relationships  . Social Herbalist on phone: Not on file    Gets together: Not on file    Attends religious service: Not on file    Active member of club or organization: Not on file    Attends meetings of clubs or organizations: Not on file    Relationship status: Not on file  . Intimate partner violence    Fear of current or ex partner: Not on file    Emotionally abused: Not on file    Physically abused: Not on file    Forced sexual activity: Not on file  Other Topics Concern  . Not on file  Social History Narrative   Pt lives in [place] with wife.  Does not routinely exercise.     Family History  Problem Relation Age of Onset  . Heart attack  Mother   . Depression Mother   . Heart disease Mother   . COPD Mother   . Hypertension Mother   . Heart attack Father   . Diabetes Father   . Depression Father   . Heart disease Father   . Cirrhosis Father   . Parkinson's disease Brother      Review of Systems: A 12-system review of systems was performed and is negative except as noted in the HPI.  Labs: No results for input(s): CKTOTAL, CKMB, TROPONINI in the last 72 hours. Lab Results  Component Value Date   WBC 6.8 06/11/2019   HGB 16.4 06/11/2019   HCT 52.6 (H) 06/11/2019   MCV 86.2 06/11/2019   PLT 192 06/11/2019    Recent Labs  Lab 06/11/19 0433  NA 139  K 3.9  CL 99  CO2 33*  BUN 16  CREATININE 0.99  CALCIUM 9.0  GLUCOSE 160*   Lab Results  Component Value Date   CHOL 125 04/19/2018   HDL 25 (L) 04/19/2018   LDLCALC 41  04/19/2018   TRIG 297 (H) 04/19/2018   No results found for: DDIMER  Radiology/Studies:  Dg Chest Port 1 View  Result Date: 06/10/2019 CLINICAL DATA:  63 year old male with chest pain. EXAM: PORTABLE CHEST 1 VIEW COMPARISON:  Chest radiograph dated 12/14/2018 FINDINGS: Diffuse interstitial coarsening and chronic bronchitic changes. There is stable cardiomegaly with multi vessel coronary vascular calcification and postsurgical changes of CABG. There is vascular congestion and probable mild edema. No pleural effusion, focal consolidation, or pneumothorax. No acute osseous pathology. IMPRESSION: Cardiomegaly with vascular congestion and possible mild interstitial edema. No focal consolidation. Electronically Signed   By: Anner Crete M.D.   On: 06/10/2019 20:50    Wt Readings from Last 3 Encounters:  06/11/19 106.9 kg  06/06/19 109.7 kg  05/29/19 111.5 kg    EKG: Sinus rhythm with no ischemia  Physical Exam:  Blood pressure (!) 146/100, pulse 70, temperature 97.9 F (36.6 C), temperature source Oral, resp. rate 19, height 5' 7" (1.702 m), weight 106.9 kg, SpO2 99 %. Body mass index is 36.92 kg/m. General: Well developed, well nourished, in no acute distress. Head: Normocephalic, atraumatic, sclera non-icteric, no xanthomas, nares are without discharge.  Neck: Negative for carotid bruits. JVD not elevated. Lungs: Clear bilaterally to auscultation without wheezes, rales, or rhonchi. Breathing is unlabored. Heart: RRR with S1 S2. No murmurs, rubs, or gallops appreciated. Abdomen: Soft, non-tender, non-distended with normoactive bowel sounds. No hepatomegaly. No rebound/guarding. No obvious abdominal masses. Msk:  Strength and tone appear normal for age. Extremities: No clubbing or cyanosis. No edema.  Distal pedal pulses are 2+ and equal bilaterally. Neuro: Alert and oriented X 3. No facial asymmetry. No focal deficit. Moves all extremities spontaneously. Psych:  Responds to questions  appropriately with a normal affect.     Assessment and Plan  63 year old male with history of coronary disease status post coronary bypass grafting in 2002 with a LIMA to the LAD and a saphenous vein graft to the OM.  The left internal mammary is atretic.  He has a history of a PCI of the LAD in 2016.  He has a stent in the OM.  The RCA had no significant disease in 2016.  He now presents with chest pain with both typical atypical features.  He states it is typical of his angina.  He is ruled out for myocardial infarction however still has chest pain.  He had a functional study in the  recent past showing no reversible ischemia.  Given persistent chest pain at rest with history of coronary disease and recurrent PCI, will proceed with a left heart cath in the morning as he is off Eliquis at present for his urologic surgery.  Further recommendations will depend on the results of this test.  Would not start heparin at present.  Signed, Teodoro Spray MD 06/11/2019, 10:53 AM Pager: [phone]

## 2019-06-11 NOTE — Progress Notes (Signed)
Pt is complaining of 10/10 chest pain. BP was at 172/105 HR 86. Nitropaste PRN was administered. Page prime and talked to Dr. Marcille Blanco and states will place order. Awaiting order. Will continue to monitor.

## 2019-06-11 NOTE — Progress Notes (Addendum)
Pt came to the floor with no signs of distress but complaints of 7/10 chest pain.. 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 Pt did states that before discharge staff wrapped the circumcision part with dressing but was not present on assessment. Will consult wound care nurse on how to take care of penis partial circumcision. Pt was also instructed after surgery to start his Eliquis Monday but was ordered as one of his medicine to take now. Page prime. Will continue to monitor.  Update 0040: Pt was educated about the importance of wearing SCD's and states that he wants to wear it. Will continue to monitor.  Update 0051: Talked to Dr. Jannifer Franklin states will discontinue eliquis. Will continue to monitor.  Update 0151: Pt states that he cannot sleep and anxious. Pt added that he usually takes xanax to help with sleep and anxiety. Notify rime. Will continue to monitor.

## 2019-06-11 NOTE — Plan of Care (Signed)
  Problem: Education: Goal: Knowledge of General Education information will improve Description: Including pain rating scale, medication(s)/side effects and non-pharmacologic comfort measures 06/11/2019 0143 by Liliane Channel, RN Outcome: Progressing 06/11/2019 0035 by Liliane Channel, RN Outcome: Progressing   Problem: Pain Managment: Goal: General experience of comfort will improve Outcome: Progressing

## 2019-06-12 ENCOUNTER — Encounter
Admission: EM | Disposition: A | Discharge status: Home / Self Care | Payer: Self-pay | Source: Home / Self Care | Attending: Emergency Medicine

## 2019-06-12 ENCOUNTER — Encounter: Payer: Self-pay | Admitting: *Deleted

## 2019-06-12 DIAGNOSIS — I2510 Atherosclerotic heart disease of native coronary artery without angina pectoris: Secondary | ICD-10-CM | POA: Diagnosis not present

## 2019-06-12 DIAGNOSIS — E119 Type 2 diabetes mellitus without complications: Secondary | ICD-10-CM | POA: Diagnosis not present

## 2019-06-12 DIAGNOSIS — I10 Essential (primary) hypertension: Secondary | ICD-10-CM | POA: Diagnosis not present

## 2019-06-12 DIAGNOSIS — I25110 Atherosclerotic heart disease of native coronary artery with unstable angina pectoris: Secondary | ICD-10-CM | POA: Diagnosis not present

## 2019-06-12 DIAGNOSIS — R079 Chest pain, unspecified: Secondary | ICD-10-CM | POA: Diagnosis not present

## 2019-06-12 HISTORY — PX: LEFT HEART CATH AND CORS/GRAFTS ANGIOGRAPHY: CATH118250

## 2019-06-12 LAB — GLUCOSE, CAPILLARY
Glucose-Capillary: 138 mg/dL — ABNORMAL HIGH (ref 70–99)
Glucose-Capillary: 156 mg/dL — ABNORMAL HIGH (ref 70–99)
Glucose-Capillary: 157 mg/dL — ABNORMAL HIGH (ref 70–99)
Glucose-Capillary: 160 mg/dL — ABNORMAL HIGH (ref 70–99)

## 2019-06-12 LAB — BASIC METABOLIC PANEL
Anion gap: 9 (ref 5–15)
BUN: 18 mg/dL (ref 8–23)
CO2: 33 mmol/L — ABNORMAL HIGH (ref 22–32)
Calcium: 9.2 mg/dL (ref 8.9–10.3)
Chloride: 98 mmol/L (ref 98–111)
Creatinine, Ser: 1.07 mg/dL (ref 0.61–1.24)
GFR calc Af Amer: >60 mL/min (ref >60)
GFR calc non Af Amer: >60 mL/min (ref >60)
Glucose, Bld: 161 mg/dL — ABNORMAL HIGH (ref 70–99)
Potassium: 4 mmol/L (ref 3.5–5.1)
Sodium: 140 mmol/L (ref 135–145)

## 2019-06-12 LAB — ECHOCARDIOGRAM COMPLETE
Height: 67 in
Weight: 3771.2 oz

## 2019-06-12 SURGERY — LEFT HEART CATH AND CORS/GRAFTS ANGIOGRAPHY
Anesthesia: Moderate Sedation

## 2019-06-12 MED ORDER — FENTANYL CITRATE (PF) 100 MCG/2ML IJ SOLN
INTRAMUSCULAR | Status: AC
  Filled 2019-06-12: qty 2

## 2019-06-12 MED ORDER — RANOLAZINE ER 500 MG PO TB12: 500.0000 mg | ORAL_TABLET | Freq: Two times a day (BID) | ORAL | 0 refills | Status: DC

## 2019-06-12 MED ORDER — SODIUM CHLORIDE 0.9 % WEIGHT BASED INFUSION
1.0000 mL/kg/h | INTRAVENOUS | Status: DC
  Administered 2019-06-12: 1 mL/kg/h via INTRAVENOUS

## 2019-06-12 MED ORDER — ASPIRIN 81 MG PO CHEW: 81.0000 mg | CHEWABLE_TABLET | ORAL | Status: DC

## 2019-06-12 MED ORDER — ONDANSETRON HCL 4 MG/2ML IJ SOLN: 4.0000 mg | Freq: Four times a day (QID) | INTRAMUSCULAR | Status: DC | PRN

## 2019-06-12 MED ORDER — FENTANYL CITRATE (PF) 100 MCG/2ML IJ SOLN
INTRAMUSCULAR | Status: DC | PRN
  Administered 2019-06-12 (×2): 25 ug via INTRAVENOUS

## 2019-06-12 MED ORDER — ACETAMINOPHEN 325 MG PO TABS: 650.0000 mg | ORAL_TABLET | ORAL | Status: DC | PRN

## 2019-06-12 MED ORDER — LABETALOL HCL 5 MG/ML IV SOLN: 10.0000 mg | INTRAVENOUS | Status: AC | PRN

## 2019-06-12 MED ORDER — SODIUM CHLORIDE 0.9 % IV SOLN: 250.0000 mL | INTRAVENOUS | Status: DC | PRN

## 2019-06-12 MED ORDER — ASPIRIN 81 MG PO CHEW
81.0000 mg | CHEWABLE_TABLET | ORAL | Status: AC
  Administered 2019-06-12: 06:00:00 81 mg via ORAL
  Filled 2019-06-12: qty 1

## 2019-06-12 MED ORDER — MORPHINE SULFATE (PF) 2 MG/ML IV SOLN
INTRAVENOUS | Status: AC
  Administered 2019-06-12: 2 mg via INTRAVENOUS
  Filled 2019-06-12: qty 1

## 2019-06-12 MED ORDER — HEPARIN (PORCINE) IN NACL 1000-0.9 UT/500ML-% IV SOLN
INTRAVENOUS | Status: DC | PRN
  Administered 2019-06-12: 500 mL

## 2019-06-12 MED ORDER — SODIUM CHLORIDE 0.9% FLUSH: 3.0000 mL | INTRAVENOUS | Status: DC | PRN

## 2019-06-12 MED ORDER — SODIUM CHLORIDE 0.9 % WEIGHT BASED INFUSION: 3.0000 mL/kg/h | INTRAVENOUS | Status: DC

## 2019-06-12 MED ORDER — IOHEXOL 300 MG/ML  SOLN
INTRAMUSCULAR | Status: DC | PRN
  Administered 2019-06-12: 95 mL via INTRAVENOUS

## 2019-06-12 MED ORDER — HYDRALAZINE HCL 20 MG/ML IJ SOLN: 10.0000 mg | INTRAMUSCULAR | Status: AC | PRN

## 2019-06-12 MED ORDER — MIDAZOLAM HCL 2 MG/2ML IJ SOLN
INTRAMUSCULAR | Status: AC
  Filled 2019-06-12: qty 2

## 2019-06-12 MED ORDER — MIDAZOLAM HCL 2 MG/2ML IJ SOLN
INTRAMUSCULAR | Status: DC | PRN
  Administered 2019-06-12 (×2): 1 mg via INTRAVENOUS

## 2019-06-12 MED ORDER — SODIUM CHLORIDE 0.9 % WEIGHT BASED INFUSION: 1.0000 mL/kg/h | INTRAVENOUS | Status: DC

## 2019-06-12 MED ORDER — SODIUM CHLORIDE 0.9% FLUSH: 3.0000 mL | Freq: Two times a day (BID) | INTRAVENOUS | Status: DC

## 2019-06-12 MED ORDER — SODIUM CHLORIDE 0.9 % WEIGHT BASED INFUSION
3.0000 mL/kg/h | INTRAVENOUS | Status: AC
  Administered 2019-06-12: 3 mL/kg/h via INTRAVENOUS

## 2019-06-12 MED ORDER — HEPARIN (PORCINE) IN NACL 1000-0.9 UT/500ML-% IV SOLN
INTRAVENOUS | Status: AC
  Filled 2019-06-12: qty 1000

## 2019-06-12 SURGICAL SUPPLY — 10 items
CATH INFINITI 5FR ANG PIGTAIL (CATHETERS) ×2 IMPLANT
CATH INFINITI 5FR JL4 (CATHETERS) ×2 IMPLANT
CATH INFINITI JR4 5F (CATHETERS) ×2 IMPLANT
DEVICE CLOSURE MYNXGRIP 5F (Vascular Products) ×2 IMPLANT
KIT MANI 3VAL PERCEP (MISCELLANEOUS) ×3 IMPLANT
NDL PERC 18GX7CM (NEEDLE) IMPLANT
NEEDLE PERC 18GX7CM (NEEDLE) ×3 IMPLANT
PACK CARDIAC CATH (CUSTOM PROCEDURE TRAY) ×3 IMPLANT
SHEATH AVANTI 5FR X 11CM (SHEATH) ×2 IMPLANT
WIRE GUIDERIGHT .035X150 (WIRE) ×2 IMPLANT

## 2019-06-12 NOTE — Plan of Care (Signed)
  Problem: Education: Goal: Knowledge of General Education information will improve Description: Including pain rating scale, medication(s)/side effects and non-pharmacologic comfort measures Outcome: Progressing   Problem: Pain Managment: Goal: General experience of comfort will improve Outcome: Progressing   Problem: Safety: Goal: Ability to remain free from injury will improve Outcome: Progressing   

## 2019-06-12 NOTE — Discharge Summary (Signed)
[employer] - [place] at [facility], 63 y.o., DOB [birth year], MRN [id number]. 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 male who presents with chief complaint as above.  Patient presents with a complaint of chest pain.  He has significant cardiac history and has had prior major MI as well as double bypass and 4 stents placed the past presented with chest pain.  His cardiac enzymes remain negative.  Due to persistent symptoms and previous coronary artery disease patient underwent a cardiac catheterization  Following was recommended by cardiology  Patient had a previously atretic left internal mammary to the LAD with a patent stent in the proximal to mid LAD.  Left main was normal.  Left circumflex had a patent stent.  RCA showed no significant disease.  Vein graft to the OM1 had a 30 to 40% mid graft stenosis with no significant flow impairment.  Medical management is recommended.  No intervention needed.           Consults  cardiology  Significant Tests:  See full reports for all details     Dg Chest Port 1 View  Result Date: 06/10/2019 CLINICAL DATA:  63 year old male with chest pain. EXAM: PORTABLE CHEST 1 VIEW COMPARISON:  Chest radiograph dated 12/14/2018 FINDINGS: Diffuse interstitial coarsening and chronic bronchitic changes. There is stable cardiomegaly with multi vessel coronary vascular calcification and postsurgical changes of CABG. There is vascular congestion and probable mild edema. No pleural effusion, focal consolidation, or  pneumothorax. No acute osseous pathology. IMPRESSION: Cardiomegaly with vascular congestion and possible mild interstitial edema. No focal consolidation. Electronically Signed   By: Anner Crete M.D.   On: 06/10/2019 20:50       Today   Subjective:   Lawrence [name] patient denies any chest pain  Objective:   Blood pressure 128/89, pulse 89, temperature 97.8 F (36.6 C), temperature source Oral, resp. rate 13, height 5' 7"  (1.702 m), weight 105.5 kg, SpO2 96 %.  .  Intake/Output Summary (Last 24 hours) at 06/12/2019 1326 Last data filed at 06/12/2019 0640 Gross per 24 hour  Intake 213.8 ml  Output 775 ml  Net -561.2 ml    Exam VITAL SIGNS: Blood pressure 128/89, pulse 89, temperature 97.8 F (36.6 C), temperature source Oral, resp. rate 13, height 5' 7"  (1.702 m), weight 105.5 kg, SpO2 96 %.  GENERAL:  63 y.o.-year-old patient lying in the bed with no acute distress.  EYES: Pupils equal, round, reactive to light and accommodation. No scleral icterus. Extraocular muscles intact.  HEENT: Head atraumatic, normocephalic. Oropharynx and nasopharynx clear.  NECK:  Supple, no jugular venous distention. No thyroid enlargement, no tenderness.  LUNGS: Normal breath sounds bilaterally, no wheezing, rales,rhonchi or crepitation. No use of accessory muscles of respiration.  CARDIOVASCULAR: S1, S2 normal. No murmurs, rubs, or gallops.  ABDOMEN: Soft, nontender, nondistended. Bowel sounds present. No organomegaly or mass.  EXTREMITIES: No pedal edema, cyanosis, or clubbing.  NEUROLOGIC: Cranial nerves II through XII are intact. Muscle strength 5/5 in all extremities. Sensation intact. Gait not checked.  PSYCHIATRIC: The patient is alert and oriented x 3.  SKIN: No obvious rash,  lesion, or ulcer.   Data Review     CBC w Diff:  Lab Results  Component Value Date   WBC 6.8 06/11/2019   HGB 16.4 06/11/2019   HGB 16.2 05/29/2019   HCT 52.6 (H) 06/11/2019   HCT 49.7 05/29/2019   PLT 192  06/11/2019   PLT 184 05/29/2019   LYMPHOPCT 33 05/26/2018   LYMPHOPCT 24.2 03/07/2015   MONOPCT 11 05/26/2018   MONOPCT 7.1 03/07/2015   EOSPCT 5 05/26/2018   EOSPCT 4.8 03/07/2015   BASOPCT 1 05/26/2018   BASOPCT 0.7 03/07/2015   CMP:  Lab Results  Component Value Date   NA 140 06/12/2019   NA 140 05/29/2019   NA 143 03/07/2015   K 4.0 06/12/2019   K 3.9 03/07/2015   CL 98 06/12/2019   CL 107 03/07/2015   CO2 33 (H) 06/12/2019   CO2 29 03/07/2015   BUN 18 06/12/2019   BUN 12 05/29/2019   BUN 9 03/07/2015   CREATININE 1.07 06/12/2019   CREATININE 0.84 03/07/2015   PROT 6.6 05/29/2019   PROT 6.8 03/07/2015   ALBUMIN 4.1 05/29/2019   ALBUMIN 3.8 03/07/2015   BILITOT 0.6 05/29/2019   BILITOT 0.6 03/07/2015   ALKPHOS 107 05/29/2019   ALKPHOS 61 03/07/2015   AST 15 05/29/2019   AST 25 03/07/2015   ALT 17 05/29/2019   ALT 25 03/07/2015  .  Micro Results Recent Results (from the past 240 hour(s))  SARS Coronavirus 2 (Performed in [place] hospital lab)     Status: None   Collection Time: 06/05/19  8:30 AM   Specimen: Nasal Swab  Result Value Ref Range Status   SARS Coronavirus 2 NEGATIVE NEGATIVE Final    Comment: (NOTE) SARS-CoV-2 target nucleic acids are NOT DETECTED. The SARS-CoV-2 RNA is generally detectable in upper and lower respiratory specimens during the acute phase of infection. Negative results do not preclude SARS-CoV-2 infection, do not rule out co-infections with other pathogens, and should not be used as the sole basis for treatment or other patient management decisions. Negative results must be combined with clinical observations, patient history, and epidemiological information. The expected result is Negative. Fact Sheet for Patients: SugarRoll.be Fact Sheet for Healthcare Providers: https://www.woods-mathews.com/ This test is not yet approved or cleared by the [place] FDA and  has been  authorized for detection and/or diagnosis of SARS-CoV-2 by FDA under an Emergency Use Authorization (EUA). This EUA will remain  in effect (meaning this test can be used) for the duration of the COVID-19 declaration under Section 56 4(b)(1) of the Act, 21 U.S.C. section 360bbb-3(b)(1), unless the authorization is terminated or revoked sooner. Performed at [facility], [place] [address] [place], [place] [address]   SARS Coronavirus 2 (CEPHEID - Performed in [place] hospital lab), Hosp Order     Status: None   Collection Time: 06/10/19  8:54 PM   Specimen: Nasopharyngeal Swab  Result Value Ref Range Status   SARS Coronavirus 2 NEGATIVE NEGATIVE Final    Comment: (NOTE) If result is NEGATIVE SARS-CoV-2 target nucleic acids are NOT DETECTED. The SARS-CoV-2 RNA is generally detectable in upper and lower  respiratory specimens during the acute phase of infection. The lowest  concentration of SARS-CoV-2 viral copies this assay can detect is 250  copies / mL. A negative result does not preclude SARS-CoV-2 infection  and should not be used as the sole basis for treatment or other  patient management decisions.  A negative result  may occur with  improper specimen collection / handling, submission of specimen other  than nasopharyngeal swab, presence of viral mutation(s) within the  areas targeted by this assay, and inadequate number of viral copies  (<250 copies / mL). A negative result must be combined with clinical  observations, patient history, and epidemiological information. If result is POSITIVE SARS-CoV-2 target nucleic acids are DETECTED. The SARS-CoV-2 RNA is generally detectable in upper and lower  respiratory specimens dur ing the acute phase of infection.  Positive  results are indicative of active infection with SARS-CoV-2.  Clinical  correlation with patient history and other diagnostic information is  necessary to determine patient infection status.  Positive  results do  not rule out bacterial infection or co-infection with other viruses. If result is PRESUMPTIVE POSTIVE SARS-CoV-2 nucleic acids MAY BE PRESENT.   A presumptive positive result was obtained on the submitted specimen  and confirmed on repeat testing.  While 2019 novel coronavirus  (SARS-CoV-2) nucleic acids may be present in the submitted sample  additional confirmatory testing may be necessary for epidemiological  and / or clinical management purposes  to differentiate between  SARS-CoV-2 and other Sarbecovirus currently known to infect humans.  If clinically indicated additional testing with an alternate test  methodology [phone]) is advised. The SARS-CoV-2 RNA is generally  detectable in upper and lower respiratory sp ecimens during the acute  phase of infection. The expected result is Negative. Fact Sheet for Patients:  StrictlyIdeas.no Fact Sheet for Healthcare Providers: BankingDealers.co.za This test is not yet approved or cleared by the [place] FDA and has been authorized for detection and/or diagnosis of SARS-CoV-2 by FDA under an Emergency Use Authorization (EUA).  This EUA will remain in effect (meaning this test can be used) for the duration of the COVID-19 declaration under Section 564(b)(1) of the Act, 21 U.S.C. section 360bbb-3(b)(1), unless the authorization is terminated or revoked sooner. Performed at [facility], [address], [place], [place] [address]         Code Status Orders  (From admission, onward)         Start     Ordered   06/11/19 0001  Full code  Continuous     06/11/19 0000        Code Status History    Date Active Date Inactive Code Status Order ID Comments User Context   12/14/2018 2323 12/16/2018 1505 Full Code [id number]  [name], MD Inpatient   04/18/2018 1546 04/19/2018 1831 Full Code [id number]  Saundra Shelling, MD Inpatient   10/01/2017 0053 10/01/2017 1758  Full Code [id number]  Lance Coon, MD Inpatient   10/29/2016 2222 11/03/2016 1618 Full Code [id number]  Lance Coon, MD Inpatient   09/03/2016 0143 09/03/2016 1846 Full Code [id number]  Lance Coon, MD ED   06/25/2016 0036 06/26/2016 1535 Full Code [id number]  Lance Coon, MD ED   04/03/2016 2055 04/07/2016 1759 Full Code [id number]  Theodoro Grist, MD Inpatient   03/26/2016 2224 03/28/2016 1800 Full Code [id number]  Lance Coon, MD Inpatient   10/07/2015 0936 10/07/2015 1830 Full Code [id number]  Dionisio David, MD Inpatient   10/07/2015 0826 10/07/2015 0936 Full Code [id number]  Dionisio David, MD Inpatient   10/05/2015 1929 10/07/2015 0826 Full Code [id number]  Idelle Crouch, MD Inpatient   09/12/2015 2129 09/14/2015 1425 Full Code [id number]  Demetrios Loll, MD Inpatient   08/01/2015 0521 08/02/2015 1346 Full Code [id number]  Lance Coon, MD Inpatient   07/12/2015 1524 07/13/2015 2017  Full Code [id number]  Belva Crome, MD Inpatient   07/11/2015 2000 07/12/2015 1524 Full Code [id number]  Reola Mosher Inpatient   06/26/2015 0543 06/27/2015 1530 Full Code [id number]  Juluis Mire, MD Inpatient   Advance Care Planning Activity          Follow-up Information    Birdie Sons, MD. Go on 06/26/2019.   Specialty: Family Medicine Why: appointment at [facility] information: [address] [place] [address] [phone]        Corey Skains, MD. Go on 06/15/2019.   Specialty: Cardiology Why: hosp f/u...Marland Kitchenappointment at 10:30am Contact information: [address] [facility] [place] [address] [place], FNP Follow up on 06/23/2019.   Specialty: Family Medicine Why: @ 10:00 a.m.   This appointment is a follow-up appoinment with Darylene Price, FNP in the [facility].   Contact information: [place] [address] [id number] [phone]            Discharge Medications   Allergies as of 06/12/2019      Reactions   Demerol  [meperidine Hcl]    Demerol [meperidine] Hives   Jardiance [empagliflozin] Other (See Comments)   Perineal pain   Prednisone Other (See Comments), Hypertension   Pt states that this medication puts him in A-fib    Sulfa Antibiotics Hives   Albuterol Sulfate [albuterol] Palpitations, Other (See Comments)   Pt currently uses this medication.     Morphine Sulfate Nausea And Vomiting, Rash, Other (See Comments)   Pt states that he is only allergic to the tablet form of this medication.        Medication List    TAKE these medications   Accu-Chek Aviva Plus w/Device Kit CHECK BLOOD SUGAR EVERY DAY   Albuterol Sulfate (sensor) 108 (90 Base) MCG/ACT Aepb Inhale 2 puffs into the lungs at bedtime. AND PRN   allopurinol 300 MG tablet Commonly known as: ZYLOPRIM Take 1 tablet (300 mg total) by mouth 2 (two) times daily.   ALPRAZolam 1 MG tablet Commonly known as: XANAX TAKE ONE-HALF TO ONE TABLET BY MOUTH THREE TIMES A DAY AS NEEDED What changed: See the new instructions.   aspirin 81 MG chewable tablet Chew 81 mg by mouth daily.   atorvastatin 80 MG tablet Commonly known as: LIPITOR TAKE ONE TABLET BY MOUTH AT BEDTIME.   cetirizine 10 MG tablet Commonly known as: ZYRTEC Take 10 mg by mouth at bedtime.   docusate sodium 100 MG capsule Commonly known as: COLACE Take 100 mg by mouth daily.   Eliquis 5 MG Tabs tablet Generic drug: apixaban Take 5 mg by mouth 2 (two) times daily.   Farxiga 10 MG Tabs tablet Generic drug: dapagliflozin propanediol Take 10 mg by mouth daily. What changed: when to take this   fluticasone furoate-vilanterol 100-25 MCG/INH Aepb Commonly known as: BREO ELLIPTA Inhale 1 puff into the lungs every morning.   glucose blood test strip Commonly known as: Accu-Chek Aviva Plus Use to check blood sugar once a day   isosorbide mononitrate 120 MG 24 hr  tablet Commonly known as: IMDUR TAKE 1 TABLET BY MOUTH DAILY What changed: when to take this   Linzess 72 MCG capsule Generic drug: linaclotide Take 72 mcg by mouth daily before breakfast.   lisinopril 2.5 MG tablet Commonly known as: ZESTRIL TAKE 1 TABLET BY MOUTH DAILY What changed:  when to take this   magnesium oxide 400 (241.3 Mg) MG tablet Commonly known as: MAG-OX TAKE 1 TABLET BY MOUTH DAILY What changed: when to take this   methocarbamol 500 MG tablet Commonly known as: Robaxin Take 1 tablet (500 mg total) by mouth 4 (four) times daily.   nitroGLYCERIN 0.4 MG SL tablet Commonly known as: NITROSTAT Place 1 tablet (0.4 mg total) under the tongue every 5 (five) minutes as needed for chest pain. Reported on 05/26/2016   omega-3 acid ethyl esters 1 g capsule Commonly known as: LOVAZA TAKE FOUR CAPSULES BY MOUTH DAILY What changed: See the new instructions.   omeprazole 40 MG capsule Commonly known as: PRILOSEC Take 40 mg by mouth 2 (two) times a day.   oxyCODONE-acetaminophen 10-325 MG tablet Commonly known as: PERCOCET Take 1 tablet by mouth every 4 (four) hours as needed. What changed:   when to take this  Another medication with the same name was removed. Continue taking this medication, and follow the directions you see here.   OXYGEN Inhale 2 L into the lungs continuous.   ranolazine 500 MG 12 hr tablet Commonly known as: RANEXA Take 1 tablet (500 mg total) by mouth 2 (two) times daily.   tiotropium 18 MCG inhalation capsule Commonly known as: Spiriva HandiHaler Place 1 capsule (18 mcg total) into inhaler and inhale daily. What changed: when to take this   torsemide 100 MG tablet Commonly known as: DEMADEX Take 50 mg by mouth daily as needed (for fluid).   traZODone 100 MG tablet Commonly known as: DESYREL Take 1 tablet (100 mg total) by mouth at bedtime. For sleep   venlafaxine XR 75 MG 24 hr capsule Commonly known as: Effexor XR Take 1  capsule (75 mg total) by mouth daily with breakfast.   Vitamin D3 125 MCG (5000 UT) Caps Take 5,000 Units by mouth daily.          Total Time in preparing paper work, data evaluation and todays exam - 41 minutes  Dustin Flock M.D on 06/12/2019 at [place]  [phone]

## 2019-06-12 NOTE — Progress Notes (Signed)
Pt to be discharged today. Iv's and tele removed. disch instructions given to him. Discharged via w.c. on 4 l 02. Family to provide his own 76 in the car.

## 2019-06-12 NOTE — Progress Notes (Signed)
Advanced care plan.  Purpose of the Encounter: CODE STATUS  Parties in Attendance: Full code  Patient's Decision Capacity: Intact  Subjective/Patient's story: Patient 63 year old with history of chronic atrial fibrillation, coronary artery disease, asthma, essential hypertension, hyperlipidemia, GERD who presents with chest pain   Objective/Medical story I discussed with the patient regarding his desires for cardiac and pulmonary resuscitation.   Goals of care determination:  Patient states that he would like everything to be done wants to be a full code   CODE STATUS: Full code   Time spent discussing advanced care planning: 16 minutes

## 2019-06-12 NOTE — Progress Notes (Signed)
Patient Name: Lawrence [name] Date of Encounter: 06/12/2019  Hospital Problem List     Principal Problem:   Chest pain Active Problems:   CAD (coronary artery disease)   HTN (hypertension)   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HCC)   GERD (gastroesophageal reflux disease)   HLD (hyperlipidemia)   COPD (chronic obstructive pulmonary disease) (HCC)   Chronic diastolic CHF (congestive heart failure) (HCC)   OSA (obstructive sleep apnea)   Paroxysmal atrial fibrillation [facility])    Patient Profile     63 year old male with history of coronary disease admitted with severe chest pain.  Ruled out for myocardial infarction.  Subjective   Still with chest pain.  Inpatient Medications    . [MAR Hold] aspirin  81 mg Oral Daily  . [MAR Hold] atorvastatin  80 mg Oral QHS  . [MAR Hold] fluticasone furoate-vilanterol  1 puff Inhalation BH-q7a  . [MAR Hold] insulin aspart  0-9 Units Subcutaneous Q6H  . [MAR Hold] pantoprazole  40 mg Oral Daily  . [MAR Hold] sodium chloride flush  3 mL Intravenous Q12H  . [MAR Hold] tiotropium  18 mcg Inhalation BH-q7a    Vital Signs    Vitals:   06/12/19 0650 06/12/19 0728 06/12/19 0752 06/12/19 0802  BP:  (!) 132/105    Pulse:  100    Resp:  (!) 21    Temp:  97.8 F (36.6 C)    TempSrc:  Oral    SpO2: 93% 94% 92% 91%  Weight:  105.5 kg    Height:  5' 7"  (1.702 m)      Intake/Output Summary (Last 24 hours) at 06/12/2019 0834 Last data filed at 06/12/2019 0640 Gross per 24 hour  Intake 216.8 ml  Output 1000 ml  Net -783.2 ml   Filed Weights   06/11/19 0348 06/12/19 0435 06/12/19 0728  Weight: 106.9 kg 105.5 kg 105.5 kg    Physical Exam    GEN: Well nourished, well developed, in no acute distress.  HEENT: normal.  Neck: Supple, no JVD, carotid bruits, or masses. Cardiac: RRR, no murmurs, rubs, or gallops. No clubbing, cyanosis, edema.  Radials/DP/PT 2+ and equal bilaterally.  Respiratory:  Respirations regular and  unlabored, clear to auscultation bilaterally. GI: Soft, nontender, nondistended, BS + x 4. MS: no deformity or atrophy. Skin: warm and dry, no rash. Neuro:  Strength and sensation are intact. Psych: Normal affect.  Labs    CBC Recent Labs    06/10/19 2028 06/11/19 0433  WBC 6.1 6.8  HGB 14.2 16.4  HCT 45.8 52.6*  MCV 85.3 86.2  PLT 163 078   Basic Metabolic Panel Recent Labs    06/11/19 0433 06/12/19 0536  NA 139 140  K 3.9 4.0  CL 99 98  CO2 33* 33*  GLUCOSE 160* 161*  BUN 16 18  CREATININE 0.99 1.07  CALCIUM 9.0 9.2   Liver Function Tests No results for input(s): AST, ALT, ALKPHOS, BILITOT, PROT, ALBUMIN in the last 72 hours. No results for input(s): LIPASE, AMYLASE in the last 72 hours. Cardiac Enzymes No results for input(s): CKTOTAL, CKMB, CKMBINDEX, TROPONINI in the last 72 hours. BNP Recent Labs    06/10/19 2028  BNP 141.0*   D-Dimer No results for input(s): DDIMER in the last 72 hours. Hemoglobin A1C No results for input(s): HGBA1C in the last 72 hours. Fasting Lipid Panel No results for input(s): CHOL, HDL, LDLCALC, TRIG, CHOLHDL, LDLDIRECT in the last 72 hours. Thyroid Function Tests No results for  input(s): TSH, T4TOTAL, T3FREE, THYROIDAB in the last 72 hours.  Invalid input(s): FREET3  Telemetry    Atrial fibrillation with variable ventricular response  ECG    Sinus rhythm  Radiology    Dg Chest Port 1 View  Result Date: 06/10/2019 CLINICAL DATA:  63 year old male with chest pain. EXAM: PORTABLE CHEST 1 VIEW COMPARISON:  Chest radiograph dated 12/14/2018 FINDINGS: Diffuse interstitial coarsening and chronic bronchitic changes. There is stable cardiomegaly with multi vessel coronary vascular calcification and postsurgical changes of CABG. There is vascular congestion and probable mild edema. No pleural effusion, focal consolidation, or pneumothorax. No acute osseous pathology. IMPRESSION: Cardiomegaly with vascular congestion and possible  mild interstitial edema. No focal consolidation. Electronically Signed   By: Anner Crete M.D.   On: 06/10/2019 20:50    Assessment & Plan    Coronary artery disease-status post coronary bypass grafting with a left internal mammary to the LAD and a saphenous vein graft to the OM1.  Has a stent in his proximal to mid LAD from previously.  Stent in the proximal left circumflex from previously.  Admitted with chest pain with typical atypical features.  Ruled out for myocardial infarction.  Cardiac cath completed due to persistent pain.  Patient had a previously atretic left internal mammary to the LAD with a patent stent in the proximal to mid LAD.  Left main was normal.  Left circumflex had a patent stent.  RCA showed no significant disease.  Vein graft to the OM1 had a 30 to 40% mid graft stenosis with no significant flow impairment.  Medical management is recommended.  No intervention needed.    Signed, Javier Docker Kioni Stahl MD 06/12/2019, 8:34 AM  Pager: [phone]

## 2019-06-12 NOTE — TOC Progression Note (Addendum)
Transition of Care [facility]) - Progression Note    Patient Details  Name: Lawrence [name] MRN: [id number] Date of Birth: 08-May-1956  Transition of Care [facility]) CM/SW Contact  Shade Flood, LCSW Phone Number: 06/12/2019, 12:12 PM  Clinical Narrative:     Pt to dc home today per MD. RN informs LCSW that pt states he has a problem with his portable O2 and needs assistance. Met with pt to discuss. Per pt, his portable concentrator needs to be charged and there is an issue with the charging of it. Pt states that [employer] supplies his O2. Per pt, he has not contacted [employer] for a service call related to this issue.   LCSW contacted Jeneen Rinks with [employer] to inquire on getting a portable tank for dc. Jeneen Rinks stated that he will call family at pt's home and see if he can trouble shoot with them on the portable pt already has. If they cannot resolve the problem, Jeneen Rinks will arrange for a portable tank to be delivered to pt's hospital room before dc.  Updated by RN that pt also does not have a functional scale at home. TOC to provide pt with a scale so that he can do daily weights at home as recommended. Offered HH RN to pt who declines.   There are no other TOC needs.       Expected Discharge Plan and Services           Expected Discharge Date: 06/12/19                                     Social Determinants of Health (SDOH) Interventions    Readmission Risk Interventions No flowsheet data found.

## 2019-06-12 NOTE — Progress Notes (Signed)
Pt accepted back from cath lab. Rt groin site clean and w/o edema or bruising. stron pedal pulses. Pt c/o chest pain. Will medicate

## 2019-06-12 NOTE — Progress Notes (Signed)
Cardiovascular and Pulmonary Nurse Navigator Note:  63 year old male with hx of chronic atrial fibrillation, DM2, COPD, , CAD, asthma, OSA - CPAP at night, essential HTN, HLD, GERD who presented to the ED with chest pain.  Patient has ruled out for myocardial infarction.    Patient underwent an echo on 06/11/2019.   Indications:     CHEST PAIN 786.50/R07.9   History:         Patient has prior history of Echocardiogram examinations, most                  recent 04/19/2018. CHF CAD COPD Risk Factors: Hypertension,                  Diabetes and Dyslipidemia.   Sonographer:     Avanell Shackleton Referring Phys:  [id number] DAVID WILLIS Diagnosing Phys: Bartholome Bill MD  IMPRESSIONS  1. Stage 1: 1: Apical septal segment and apex are abnormal.  2. The left ventricle has low normal systolic function, with an ejection fraction of 50-55%. The cavity size was normal. There is mildly increased left ventricular wall thickness. Left ventricular diastolic Doppler parameters are consistent with  impaired relaxation.  3. The right ventricle has normal systolic function. The cavity was mildly enlarged. There is no increase in right ventricular wall thickness.  4. Left atrial size was mildly dilated.  5. Right atrial size was mildly dilated.  6. The aortic valve is tricuspid. Aortic valve regurgitation is trivial by color flow Doppler.  7. The aorta is normal in size and structure.  8. The interatrial septum was not well visualized. ___________________________________________  Reading physician: Teodoro Spray, MD Ordering physician: Teodoro Spray, MD Study date: 06/12/19  Physicians  Panel Physicians Referring Physician Case Authorizing Physician  Fath, Javier Docker, MD (Primary)  Teodoro Spray, MD  Procedures  LEFT HEART CATH AND CORS/GRAFTS ANGIOGRAPHY  Conclusion    Non-stenotic Prox LAD to Mid LAD lesion was previously treated.  Mid LAD lesion is 50% stenosed.  Non-stenotic Dist LAD-1 lesion  was previously treated.  Dist LAD-2 lesion is 50% stenosed.  Ost Cx to Prox Cx lesion is 50% stenosed.  Non-stenotic Prox Cx to Dist Cx lesion was previously treated.  SVG and is large.  The flow is not reversed.  There is no competitive flow  Balloon angioplasty was performed.  LIMA and is small.  The graft exhibits diffuse disease.  The flow is not reversed.  There is no competitive flow  The graft exhibits mild .  Prox Graft lesion is 30% stenosed.  The left ventricular systolic function is normal.  LV end diastolic pressure is normal.  The left ventricular ejection fraction is 50-55% by visual estimate.  There is trivial (1+) mitral regurgitation.   LM-normal LAD-patent stent in proximal to mid lad with small distal lad with diffuse 50% stenosis 50% stenosis in d2 LCx- Patent stent in proximal lcx  Patent svg to om1 RCA-insignificant disease LIMA-chronically atretic SVG-OM1-large graft with less than 50% stenosis mid graft LV-ef 55% Medical management  _____________________________________________   Patient lives with his wife, who has cancer and has had a recent stroke.  Patient's wife is receiving home health care.  Patient stated he does not want or need Home Health care.  Yolanda Bonine is staying with them right now and Son is 8 minutes away.   Patient does not have a functioning scale.  Patient stated it fell off the shelf and broke.  He tried putting  a new battery, but still did not work.  This RN consulted RN CM regarding patient's need for a new scale.  RN CM to provide patient with new scale.       CHF Education:?? Educational session with patient completed.  NOTE:  CHF is a not a new diagnosis for this patient.   Provided patient with "Living Better with Heart Failure" packet. Briefly reviewed definition of heart failure and signs and symptoms of an exacerbation.  Explained to patient that HF is a chronic illness which requires self-assessment /  self-management along with help from the cardiologist/PCP.? ? *Reviewed importance of and reason behind checking weight daily in the AM, after using the bathroom, but before getting dressed.   *Reviewed with patient the following information: *Discussed when to call the Dr= weight gain of >2-3lb overnight of 5lb in a week, *Discussed yellow zone= call MD: weight gain of >2-3lb overnight of 5lb in a week, increased swelling, increased SOB when lying down, chest discomfort, dizziness, increased fatigue *Red Zone= call 911: struggle to breath, fainting or near fainting, significant chest pain ? *Heart Failure Zone Magnet given and reviewed with patient.   *Diet - Patient currently ordered Heart Healthy Diet.   Instructed patient to follow a low sodium diet of 2000 mg or less.  Recommended foods for low sodium heart healthy nutrition reviewed with patient.   Reviewed with patient steps to reading a food label with close attention to serving size and mg of sodium.   *Discussed fluid intake with patient as well. Patient not currently on a fluid restriction, but advised no more than 64 ounces of fluid per day.    *Instructed patient to take medications as prescribed for heart failure. Explained briefly why patient is on the medications (either make you feel better, live longer or keep you out of the hospital) and discussed monitoring and side effects.  Patient currently on the following Cardiac Medications:  Aspirin 81 mg daily; Lipitor 80 mg daily; Eliquis 5 mg tabs twice a day; Imdur 20 mg once a day, Lisinopril 2.5 mg daily; Torsemide 50 mg as needed;   *Discussed exercise / activity. Patient is on chronic oxygen at home.  Patient stated he is able to move around in his house without difficulty.   Encouraged patient to remain as active as possible.    ? *Smoking Cessation- Patient is a FORMER smoker.    ?  *[facility] - Explained the purpose of the HF Clinic. ?Explained to patient  the HF Clinic does not replace PCP nor Cardiologist, but is an additional resource to helping patient manage heart failure at home. Patient does not wish to be followed in the [facility] HF Clinic. As a result, the appointment which had been made for the patient in the [facility] was cancelled per patient's request.    OR Patient is an established patient in the [facility].  Next appt scheduled for ____________.  OR Patient's appointment in the [facility] HF Clinic is scheduled for _____________.   ?   Again, the 5 Steps to Living Better with Heart Failure were reviewed with patient.    Patient thanked me for providing the above information. ?

## 2019-06-13 ENCOUNTER — Other Ambulatory Visit: Payer: Self-pay | Admitting: Adult Health

## 2019-06-13 ENCOUNTER — Other Ambulatory Visit: Payer: Self-pay | Admitting: Family

## 2019-06-13 LAB — HIV ANTIBODY (ROUTINE TESTING W REFLEX): HIV Screen 4th Generation wRfx: NONREACTIVE

## 2019-06-16 ENCOUNTER — Other Ambulatory Visit: Payer: Self-pay

## 2019-06-16 ENCOUNTER — Encounter: Payer: Self-pay | Admitting: Emergency Medicine

## 2019-06-16 DIAGNOSIS — Z79899 Other long term (current) drug therapy: Secondary | ICD-10-CM | POA: Insufficient documentation

## 2019-06-16 DIAGNOSIS — I259 Chronic ischemic heart disease, unspecified: Secondary | ICD-10-CM | POA: Diagnosis not present

## 2019-06-16 DIAGNOSIS — I4891 Unspecified atrial fibrillation: Secondary | ICD-10-CM | POA: Diagnosis not present

## 2019-06-16 DIAGNOSIS — Z7982 Long term (current) use of aspirin: Secondary | ICD-10-CM | POA: Insufficient documentation

## 2019-06-16 DIAGNOSIS — I110 Hypertensive heart disease with heart failure: Secondary | ICD-10-CM | POA: Insufficient documentation

## 2019-06-16 DIAGNOSIS — Z4801 Encounter for change or removal of surgical wound dressing: Secondary | ICD-10-CM | POA: Diagnosis not present

## 2019-06-16 DIAGNOSIS — J45909 Unspecified asthma, uncomplicated: Secondary | ICD-10-CM | POA: Diagnosis not present

## 2019-06-16 DIAGNOSIS — J449 Chronic obstructive pulmonary disease, unspecified: Secondary | ICD-10-CM | POA: Diagnosis not present

## 2019-06-16 DIAGNOSIS — I5032 Chronic diastolic (congestive) heart failure: Secondary | ICD-10-CM | POA: Insufficient documentation

## 2019-06-16 DIAGNOSIS — Z7901 Long term (current) use of anticoagulants: Secondary | ICD-10-CM | POA: Insufficient documentation

## 2019-06-16 DIAGNOSIS — E119 Type 2 diabetes mellitus without complications: Secondary | ICD-10-CM | POA: Diagnosis not present

## 2019-06-16 DIAGNOSIS — Z87891 Personal history of nicotine dependence: Secondary | ICD-10-CM | POA: Insufficient documentation

## 2019-06-16 DIAGNOSIS — M549 Dorsalgia, unspecified: Secondary | ICD-10-CM | POA: Diagnosis not present

## 2019-06-16 DIAGNOSIS — N99820 Postprocedural hemorrhage and hematoma of a genitourinary system organ or structure following a genitourinary system procedure: Secondary | ICD-10-CM | POA: Diagnosis not present

## 2019-06-16 DIAGNOSIS — G8929 Other chronic pain: Secondary | ICD-10-CM | POA: Diagnosis not present

## 2019-06-16 NOTE — ED Triage Notes (Signed)
Patient states that he had a partial circumcision on 7/24. Patient states that a couple of hours ago when he urinated he noticed that his incision had opened.

## 2019-06-17 ENCOUNTER — Emergency Department
Admission: EM | Admit: 2019-06-17 | Discharge: 2019-06-17 | Disposition: A | Discharge status: Home / Self Care | Payer: Medicare HMO | Attending: Emergency Medicine | Admitting: Emergency Medicine

## 2019-06-17 DIAGNOSIS — N99820 Postprocedural hemorrhage and hematoma of a genitourinary system organ or structure following a genitourinary system procedure: Secondary | ICD-10-CM | POA: Diagnosis not present

## 2019-06-17 DIAGNOSIS — Z5189 Encounter for other specified aftercare: Secondary | ICD-10-CM

## 2019-06-17 MED ORDER — OXYCODONE-ACETAMINOPHEN 5-325 MG PO TABS
2.0000 | ORAL_TABLET | Freq: Once | ORAL | Status: AC
  Administered 2019-06-17: 1 via ORAL
  Filled 2019-06-17: qty 2

## 2019-06-17 MED ORDER — OXYCODONE-ACETAMINOPHEN 5-325 MG PO TABS
1.0000 | ORAL_TABLET | Freq: Once | ORAL | Status: AC
  Administered 2019-06-17: 1 via ORAL
  Filled 2019-06-17: qty 1

## 2019-06-17 NOTE — Discharge Instructions (Signed)
Return to the emergency room for any new or worrisome symptoms including bleeding or fever or redness or other concerns.  If you do have a little bit of bleeding from there just apply pressure with your finger and it should stop.  Pressure somewhat elevated here, we asked that you get it rechecked as soon as possible.  Continue taking your blood pressure medications.  If you have any other concerns please return to the ER.

## 2019-06-17 NOTE — ED Provider Notes (Addendum)
[facility] Emergency Department Provider Note  ____________________________________________   I have reviewed the triage vital signs and the nursing notes. Where available I have reviewed prior notes and, if possible and indicated, outside hospital notes.   Patient seen and evaluated during the coronavirus epidemic during a time with low staffing  Patient seen for the symptoms described in the history of present illness. She was evaluated in the context of the global COVID-19 pandemic, which necessitated consideration that the patient might be at risk for infection with the SARS-CoV-2 virus that causes COVID-19. Institutional protocols and algorithms that pertain to the evaluation of patients at risk for COVID-19 are in a state of rapid change based on information released by regulatory bodies including the CDC and federal and state organizations. These policies and algorithms were followed during the patient's care in the ED.    HISTORY  Chief Complaint Post-op Problem    HPI Lawrence [name] is a 63 y.o. male with a history of the below mentioned medical problems, who on Eliquis for atrial fibrillation, had surgery on his foreskin secondary to a history of paraphimosis.  Patient states he has been doing pretty well and that he was on the toilet noted a little bit of blood from his penis and he cannot see his penis due to his BMI and so he wanted to have it checked out.  Patient is on chronic significant narcotics.  He has chronic back pain.  He has a history of chronic narcotic use I have reviewed his DEA notes for this.  Patient states that he wants "something really strong for this pain".  When asked him what hurts he states that the area has been uncomfortable since the surgery.  Also he wants more pain meds for his chronic back pain.   Past Medical History:  Diagnosis Date  . A-fib ([place])   . Anemia   . Anginal pain ([place])   . Anxiety   . Arthritis   . Asthma    . CAD (coronary artery disease)    a. 2002 CABGx2 (LIMA->LAD, VG->VG->OM1);  b. 09/2012 DES->OM;  c. 03/2015 PTCA of LAD [facility]) in setting of atretic LIMA; d. 05/2015 Cath [facility]): nonobs dzs; e. 06/2015 Cath ([facility]): LM nl, LAD 45p/d ISR, 50d, D1/2 small, LCX 50p/d ISR, OM1 70ost, 30 ISR, VG->OM1 50ost, 43m LIMA->LAD 99p/d - atretic, RCA dom, nl; f.cath 10/16: 40-50%(FFR 0.90) pLAD, 75% (FFR 0.77) mLAD s/p PCI/DES, oRCA 40% (FFR0.95)  . Cancer ([place]    SKIN CANCER ON BACK  . Celiac disease   . Chronic diastolic CHF (congestive heart failure) ([place]    a. 06/2009 Echo: EF 60-65%, Gr 1 DD, triv AI, mildly dil LA, nl RV.  .Marland KitchenCOPD (chronic obstructive pulmonary disease) ([place]    a. Chronic bronchitis and emphysema.  . DDD (degenerative disc disease), lumbar   . Diverticulosis   . Dysrhythmia   . Essential hypertension   . GERD (gastroesophageal reflux disease)   . History of hiatal hernia   . History of kidney stones    H/O  . History of tobacco abuse    a. Quit 2014.  .[place]land KitchenMyocardial infarction ([place] 2002   4 STENTS  . Pancreatitis   . PSVT (paroxysmal supraventricular tachycardia) ([place]    a. 10/2012 Noted on Zio Patch.  . Sleep apnea    LOST CORD TO CPAP -ONLY 02 @ BEDTIME  . Tubular adenoma of colon   . Type II diabetes mellitus ([place]  Patient Active Problem List   Diagnosis Date Noted  . Chronic respiratory failure with hypoxia ([place]) 05/29/2019  . Morbid obesity ([place]) 05/29/2019  . Trigger thumb of right hand 11/28/2018  . Eye pain, left 08/18/2018  . Acute on chronic heart failure ([place]) 04/18/2018  . History of adenomatous polyp of colon 04/05/2018  . Abdominal pain, chronic, epigastric 11/06/2017  . Bilateral flank pain 03/24/2017  . Dyspnea 04/03/2016  . Hypotension 04/03/2016  . CKD (chronic kidney disease) stage 2, GFR 60-89 ml/min 04/03/2016  . Anemia 04/03/2016  . Paroxysmal atrial fibrillation ([place]) 12/23/2015  . OSA (obstructive sleep apnea) 12/10/2015  . Left  inguinal hernia 11/07/2015  . Anxiety 11/07/2015  . Unstable angina ([name]) 10/05/2015  . Back pain with left-sided radiculopathy 09/30/2015  . Nocturnal hypoxia 09/06/2015  . BPH (benign prostatic hyperplasia) 08/01/2015  . Chronic diastolic CHF (congestive heart failure) ([place])   . Angina pectoris ([place])   . Chest pain 07/11/2015  . COPD (chronic obstructive pulmonary disease) ([place]) 07/03/2015  . CAD (coronary artery disease) 06/26/2015  . HTN (hypertension) 06/26/2015  . 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[place]) 06/26/2015  . Achalasia 07/24/2014  . GERD (gastroesophageal reflux disease) 06/07/2014  . Former tobacco use 04/11/2013  . HLD (hyperlipidemia) 04/09/2013    Past Surgical History:  Procedure Laterality Date  . BYPASS GRAFT    . CARDIAC CATHETERIZATION N/A 07/12/2015   rocedure: Left Heart Cath and Cors/Grafts Angiography;  Surgeon: Belva Crome, MD;  Location: [place] CV LAB;  Service: Cardiovascular;  Laterality: N/A;  . CARDIAC CATHETERIZATION Right 10/07/2015   Procedure: Left Heart Cath and Cors/Grafts Angiography;  Surgeon: Dionisio David, MD;  Location: [place] CV LAB;  Service: Cardiovascular;  Laterality: Right;  . CARDIAC CATHETERIZATION N/A 04/06/2016   Procedure: Left Heart Cath and Coronary Angiography;  Surgeon: Yolonda Kida, MD;  Location: [place] CV LAB;  Service: Cardiovascular;  Laterality: N/A;  . CARDIAC CATHETERIZATION  04/06/2016   Procedure: Bypass Graft Angiography;  Surgeon: Yolonda Kida, MD;  Location: [place] CV LAB;  Service: Cardiovascular;;  . CARDIAC CATHETERIZATION N/A 11/02/2016   Procedure: Left Heart Cath and Cors/Grafts Angiography and possible PCI;  Surgeon: Yolonda Kida, MD;  Location: [place] [facility];  Service: Cardiovascular;  Laterality: N/A;  . CARDIAC CATHETERIZATION N/A 11/02/2016   Procedure: Coronary Stent Intervention;  Surgeon: Yolonda Kida, MD;  Location: [place] CV  LAB;  Service: Cardiovascular;  Laterality: N/A;  . CHOLECYSTECTOMY    . CIRCUMCISION N/A 06/09/2019   Procedure: CIRCUMCISION ADULT;  Surgeon: Billey Co, MD;  Location: [facility] ORS;  Service: Urology;  Laterality: N/A;  . COLONOSCOPY WITH PROPOFOL N/A 04/01/2018   Procedure: COLONOSCOPY WITH PROPOFOL;  Surgeon: Manya Silvas, MD;  Location: [facility] END[facility]COPY;  Service: Endoscopy;  Laterality: N/A;  . ESOPHAGEAL DILATION    . ESOPHAGOGASTRODUODENOSCOPY (EGD) WITH PROPOFOL N/A 04/01/2018   Procedure: ESOPHAGOGASTRODUODENOSCOPY (EGD) WITH PROPOFOL;  Surgeon: Manya Silvas, MD;  Location: [facility] ENDOSCOPY;  Service: Endoscopy;  Laterality: N/A;  . LEFT HEART CATH AND CORS/GRAFTS ANGIOGRAPHY N/A 06/12/2019   Procedure: LEFT HEART CATH AND CORS/GRAFTS ANGIOGRAPHY;  Surgeon: Teodoro Spray, MD;  Location: [place] CV LAB;  Service: Cardiovascular;  Laterality: N/A;  . TONSILLECTOMY    . VASCULAR SURGERY      Prior to Admission medications   Medication Sig Start Date End Date Taking? Authorizing Provider  Albuterol Sulfate, sensor, 108 (90 Base) MCG/ACT AEPB Inhale 2  puffs into the lungs at bedtime. AND PRN    [provider]  allopurinol (ZYLOPRIM) 300 MG tablet Take 1 tablet (300 mg total) by mouth 2 (two) times daily. 10/05/18   Birdie Sons, MD  ALPRAZolam (XANAX) 1 MG tablet TAKE ONE-HALF TO ONE TABLET BY MOUTH THREE TIMES A DAY AS NEEDED Patient taking differently: Take 1 mg by mouth 3 (three) times daily.  02/23/19   Birdie Sons, MD  apixaban (ELIQUIS) 5 MG TABS tablet Take 5 mg by mouth 2 (two) times daily.     [provider]  aspirin 81 MG chewable tablet Chew 81 mg by mouth daily.    [provider]  atorvastatin (LIPITOR) 80 MG tablet TAKE ONE TABLET BY MOUTH AT BEDTIME. Patient taking differently: Take 80 mg by mouth at bedtime.  09/19/18   Birdie Sons, MD  Blood Glucose Monitoring Suppl (ACCU-CHEK AVIVA PLUS) w/Device KIT CHECK BLOOD  SUGAR EVERY DAY 12/08/17   Birdie Sons, MD  BREO ELLIPTA 100-25 MCG/INH AEPB INHALE 1 PUFF INTO THE LUNGS DAILY 06/13/19   Kendell Bane, NP  cetirizine (ZYRTEC) 10 MG tablet Take 10 mg by mouth at bedtime.     [provider]  Cholecalciferol (VITAMIN D3) 125 MCG (5000 UT) CAPS Take 5,000 Units by mouth daily.    [provider]  dapagliflozin propanediol (FARXIGA) 10 MG TABS tablet Take 10 mg by mouth daily. Patient taking differently: Take 10 mg by mouth every morning.  05/29/19   Birdie Sons, MD  docusate sodium (COLACE) 100 MG capsule Take 100 mg by mouth daily.     [provider]  glucose blood (ACCU-CHEK AVIVA PLUS) test strip Use to check blood sugar once a day 02/14/18   Birdie Sons, MD  isosorbide mononitrate (IMDUR) 120 MG 24 hr tablet TAKE 1 TABLET BY MOUTH DAILY Patient taking differently: Take 120 mg by mouth every morning.  01/19/19   Birdie Sons, MD  LINZESS 72 MCG capsule Take 72 mcg by mouth daily before breakfast.     [provider]  lisinopril (PRINIVIL,ZESTRIL) 2.5 MG tablet TAKE 1 TABLET BY MOUTH DAILY Patient taking differently: Take 2.5 mg by mouth every morning.  10/20/18   Birdie Sons, MD  magnesium oxide (MAG-OX) 400 (241.3 Mg) MG tablet Take 1 tablet (400 mg total) by mouth every morning. 06/13/19   Alisa Graff, FNP  methocarbamol (ROBAXIN) 500 MG tablet Take 1 tablet (500 mg total) by mouth 4 (four) times daily. 05/29/19   Birdie Sons, MD  nitroGLYCERIN (NITROSTAT) 0.4 MG SL tablet Place 1 tablet (0.4 mg total) under the tongue every 5 (five) minutes as needed for chest pain. Reported on 05/26/2016 11/28/18   Birdie Sons, MD  omega-3 acid ethyl esters (LOVAZA) 1 g capsule TAKE FOUR CAPSULES BY MOUTH DAILY Patient taking differently: Take 4 g by mouth daily.  06/24/18   Birdie Sons, MD  omeprazole (PRILOSEC) 40 MG capsule Take 40 mg by mouth 2 (two) times a day.  04/24/19   [provider]   oxyCODONE-acetaminophen (PERCOCET) 10-325 MG tablet Take 1 tablet by mouth every 4 (four) hours as needed. Patient taking differently: Take 1 tablet by mouth every 4 (four) hours.  05/21/19   Mar Daring, PA-C  OXYGEN Inhale 2 L into the lungs continuous.     [provider]  ranolazine (RANEXA) 500 MG 12 hr tablet Take 1 tablet (500 mg  total) by mouth 2 (two) times daily. 06/12/19   Dustin Flock, MD  tiotropium (SPIRIVA HANDIHALER) 18 MCG inhalation capsule Place 1 capsule (18 mcg total) into inhaler and inhale daily. Patient taking differently: Place 18 mcg into inhaler and inhale every morning.  02/20/19   Birdie Sons, MD  torsemide (DEMADEX) 100 MG tablet Take 50 mg by mouth daily as needed (for fluid).  10/11/17   [provider]  traZODone (DESYREL) 100 MG tablet Take 1 tablet (100 mg total) by mouth at bedtime. For sleep 05/15/19   Chrismon, Vickki Muff, PA  venlafaxine XR (EFFEXOR-XR) 75 MG 24 hr capsule Take 1 capsule (75 mg total) by mouth daily with breakfast. 04/04/19   Birdie Sons, MD    Allergies Demerol  [meperidine hcl], Demerol [meperidine], Jardiance [empagliflozin], Prednisone, Sulfa antibiotics, Albuterol sulfate [albuterol], and Morphine sulfate  Family History  Problem Relation Age of Onset  . Heart attack Mother   . Depression Mother   . Heart disease Mother   . COPD Mother   . Hypertension Mother   . Heart attack Father   . Diabetes Father   . Depression Father   . Heart disease Father   . Cirrhosis Father   . Parkinson's disease Brother     Social History Social History   Tobacco Use  . Smoking status: Former Smoker    Packs/day: 3.00    Years: 50.00    Pack years: 150.00    Types: Cigarettes    Quit date: 04/22/2013    Years since quitting: 6.1  . Smokeless tobacco: Never Used  Substance Use Topics  . Alcohol use: No    Comment: remotely quit alcohol use. Hx of heavy alcohol use.  . Drug use: No    Review of  Systems Constitutional: No fever/chills Eyes: No visual changes. ENT: No sore throat. No stiff neck no neck pain Cardiovascular: Denies chest pain. Respiratory: Denies shortness of breath. Gastrointestinal:   no vomiting.  No diarrhea.  No constipation. Genitourinary: Negative for dysuria. Musculoskeletal: Negative lower extremity swelling Skin: Negative for rash. Neurological: Negative for severe headaches, focal weakness or numbness.   ____________________________________________   PHYSICAL EXAM:  VITAL SIGNS: ED Triage Vitals  Enc Vitals Group     BP 06/16/19 2353 (!) 158/69     Pulse Rate 06/16/19 2353 (!) 54     Resp 06/16/19 2353 18     Temp 06/16/19 2353 98.6 F (37 C)     Temp Source 06/16/19 2353 Oral     SpO2 06/17/19 0237 95 %     Weight 06/16/19 2354 240 lb (108.9 kg)     Height 06/16/19 2354 5' 7"  (1.702 m)     Head Circumference --      Peak Flow --      Pain Score 06/16/19 2354 9     Pain Loc --      Pain Edu? --      Excl. in [place]? --     Constitutional: Alert and oriented. Well appearing and in no acute distress.  Abdominal: Soft and nontender. No distention. No guarding no rebound GU: There is a V shaped incision and his foreskin.  It is very well healing, at the apex of the V however there is a tiny, 1 mm, cut as if he had separated the flaps a little wide.  There is no active bleeding.  There is some dried blood on the glans penis.  There is no other lesion noted.  The penis itself is otherwise normal testicular exam is normal, not significantly tender. Back:  There is no focal tenderness or step off.  there is no midline tenderness there are no lesions noted. there is no CVA tenderness  Musculoskeletal: No lower extremity tenderness, no upper extremity tenderness. No joint effusions, no DVT signs strong distal pulses no edema Neurologic:  Normal speech and language. No gross focal neurologic deficits are appreciated.  Skin:  Skin is warm, dry and  intact. No rash noted. Psychiatric: Mood and affect are normal. Speech and behavior are normal.  ____________________________________________   LABS (all labs ordered are listed, but only abnormal results are displayed)  Labs Reviewed - No data to display  Pertinent labs  results that were available during my care of the patient were reviewed by me and considered in my medical decision making (see chart for details). ____________________________________________  EKG  I personally interpreted any EKGs ordered by me or triage  ____________________________________________  RADIOLOGY  Pertinent labs & imaging results that were available during my care of the patient were reviewed by me and considered in my medical decision making (see chart for details). If possible, patient and/or family made aware of any abnormal findings.  No results found. ____________________________________________    PROCEDURES  Procedure(s) performed: None  Procedures  Critical Care performed: None  ____________________________________________   INITIAL IMPRESSION / ASSESSMENT AND PLAN / ED COURSE  Pertinent labs & imaging results that were available during my care of the patient were reviewed by me and considered in my medical decision making (see chart for details).   Patient is here with a small cut to his surgical site no evidence of infection no evidence of ongoing bleeding no evidence of other trauma.  Given that he is on Eliquis, I will place Surgicel over the area to hopefully prevent for sure mild bleeding.  I did clean the area well with antiseptic and I have given him advised to return for increased pain or fever.  Patient is demanding IV narcotics for this tiny cut and also for his chronic back pain.  We have given him oral narcotics I will not give him IV narcotics for this.  Given DEA in hospital guidelines, IV narcotics are not indicated  ----------------------------------------- 4:46  AM on 06/17/2019 -----------------------------------------  [place] has been placed after cleaning, still no evidence of bleeding or infection.  Advised the patient to remove the Surgicel tomorrow if it still there.  This is just to give it time to heal.  Do not feel comfortable giving him more narcotics after what we have given him here.  Advised him not to drive on narcotics.  Patient blood pressure somewhat elevated.  Did not take his blood pressure medications last night apparently.  No signs or symptoms of decompensated hypertensive urgency.  Advised him to take his blood pressure medications focals with primary care doctor.     ____________________________________________   FINAL CLINICAL IMPRESSION(S) / ED DIAGNOSES  Final diagnoses:  None      This chart was dictated using voice recognition software.  Despite best efforts to proofread,  errors can occur which can change meaning.      Schuyler Amor, MD 06/17/19 0479    Schuyler Amor, MD 06/17/19 [id number]

## 2019-06-17 NOTE — ED Notes (Signed)
Dicussed with Dr. Jimmye Norman about pt presentation received verbal order to administer Percocet 10/382m

## 2019-06-17 NOTE — ED Notes (Addendum)
Reviewed discharge instructions, follow-up care, and incision site care with patient. Patient verbalized understanding of all information reviewed. Patient stable, with no distress noted at this time.

## 2019-06-17 NOTE — ED Notes (Signed)
ED Provider at bedside. 

## 2019-06-17 NOTE — ED Notes (Signed)
Scant amount of blood seen at incision site. Patient c/o pain at site.

## 2019-06-17 NOTE — ED Notes (Signed)
This RN at bedside with MD as witness while MD cleans site and applies surgicel.

## 2019-06-17 NOTE — ED Notes (Signed)
Patient to stat desk stating that he is having bleeding from his incision when he went to the the bathroom. No bleeding at this time.

## 2019-06-18 DIAGNOSIS — J42 Unspecified chronic bronchitis: Secondary | ICD-10-CM | POA: Diagnosis not present

## 2019-06-18 DIAGNOSIS — J449 Chronic obstructive pulmonary disease, unspecified: Secondary | ICD-10-CM | POA: Diagnosis not present

## 2019-06-19 ENCOUNTER — Telehealth: Payer: Self-pay | Admitting: Radiology

## 2019-06-19 NOTE — Telephone Encounter (Signed)
Patient expresses unhappiness with dorsal slit performed by Dr Diamantina Providence on 06/09/2019. States he prefers a Engineer, water. Appointment made for patient to discuss with Dr Diamantina Providence. Patient expresses appreciation.

## 2019-06-20 ENCOUNTER — Other Ambulatory Visit: Payer: Self-pay | Admitting: Family Medicine

## 2019-06-20 ENCOUNTER — Telehealth: Payer: Self-pay | Admitting: Family Medicine

## 2019-06-20 DIAGNOSIS — M5410 Radiculopathy, site unspecified: Secondary | ICD-10-CM

## 2019-06-20 DIAGNOSIS — G4700 Insomnia, unspecified: Secondary | ICD-10-CM

## 2019-06-20 NOTE — Telephone Encounter (Signed)
Pt needing refills on:  oxyCODONE-acetaminophen (PERCOCET) 10-325 MG tablet  traZODone (DESYREL) 100 MG tablet   Please fill at:  [place], [place] - [place] [phone] (Phone) [phone] (Fax)   Thanks, [place]

## 2019-06-21 MED ORDER — TRAZODONE HCL 100 MG PO TABS: 100.0000 mg | ORAL_TABLET | Freq: Every day | ORAL | 0 refills | Status: DC

## 2019-06-21 MED ORDER — OXYCODONE-ACETAMINOPHEN 10-325 MG PO TABS: 1.0000 | ORAL_TABLET | ORAL | 0 refills | Status: DC

## 2019-06-21 NOTE — Telephone Encounter (Signed)
Pt calling back on refill status.  Pt is out of both medications.  Thanks, [employer]

## 2019-06-22 ENCOUNTER — Other Ambulatory Visit: Payer: Self-pay | Admitting: Family Medicine

## 2019-06-22 DIAGNOSIS — F419 Anxiety disorder, unspecified: Secondary | ICD-10-CM

## 2019-06-22 NOTE — Progress Notes (Deleted)
Patient ID: Lawrence [name], male    DOB: 1956/07/15, 63 y.o.   MRN: [id number]  HPI  Lawrence [name] is a 63 y/o male with a history of DM, PSVT, HTN, COPD (chronic oxygen), CAD with CABG, asthma, anxiety, anemia, atrial fibrillation, obstructive sleep apnea,  past tobacco use and chronic heart failure.   Echo report from 06/11/2019 reviewed and showed an EF of 50-55% along with trivial AR. Echo report from 04/19/18 reviewed and showed an EF of 50-55%. Echo report from 11/10/17 reviewed and showed an EF of 50% along with mild AR/Lawrence/TR. Echo done 03/27/16 and showed an EF of 60-65% along with trivial Lawrence/TR. EF has improved from 45-50% November 2016.   Cardiac catheterization done 06/12/2019 showed: LM-normal LAD-patent stent in proximal to mid lad with small distal lad with diffuse 50% stenosis 50% stenosis in d2 LCx- Patent stent in proximal lcx  Patent svg to om1 RCA-insignificant disease LIMA-chronically atretic SVG-OM1-large graft with less than 50% stenosis mid graft LV-ef 55%  Cardiac catheterization was done 11/02/16 and a DES was placed in the SVG to OM1 artery.   Was in the ED 06/17/2019 due to wound check and pain. Treated and released. Admitted 06/10/2019 due to chest pain. Cardiology consult obtained. Cardiac enzymes negative. Catheterization done. Discharged after 2 days. Was in the ED 05/22/2019 due to paraphimosis. Urology consult obtained and he was released.   He presents today for a follow-up visit with a chief complaint of   Past Medical History:  Diagnosis Date  . A-fib ([place])   . Anemia   . Anginal pain ([place])   . Anxiety   . Arthritis   . Asthma   . CAD (coronary artery disease)    a. 2002 CABGx2 (LIMA->LAD, VG->VG->OM1);  b. 09/2012 DES->OM;  c. 03/2015 PTCA of LAD [facility]) in setting of atretic LIMA; d. 05/2015 [facility]): nonobs dzs; e. 06/2015 Cath ([facility]): LM nl, LAD 45p/d ISR, 50d, D1/2 small, LCX 50p/d ISR, OM1 70ost, 30 ISR, VG->OM1 50ost, 89m LIMA->LAD 99p/d - atretic, RCA  dom, nl; f.cath 10/16: 40-50%(FFR 0.90) pLAD, 75% (FFR 0.77) mLAD s/p PCI/DES, oRCA 40% (FFR0.95)  . Cancer ([place]    SKIN CANCER ON BACK  . Celiac disease   . Chronic diastolic CHF (congestive heart failure) ([place]    a. 06/2009 Echo: EF 60-65%, Gr 1 DD, triv AI, mildly dil LA, nl RV.  .Marland KitchenCOPD (chronic obstructive pulmonary disease) ([place]    a. Chronic bronchitis and emphysema.  . DDD (degenerative disc disease), lumbar   . Diverticulosis   . Dysrhythmia   . Essential hypertension   . GERD (gastroesophageal reflux disease)   . History of hiatal hernia   . History of kidney stones    H/O  . History of tobacco abuse    a. Quit 2014.  .Marland KitchenMyocardial infarction ([place] 2002   4 STENTS  . Pancreatitis   . PSVT (paroxysmal supraventricular tachycardia) ([place]    a. 10/2012 Noted on Zio Patch.  . Sleep apnea    LOST CORD TO CPAP -ONLY 02 @ BEDTIME  . Tubular adenoma of colon   . Type II diabetes mellitus ([place]    Past Surgical History:  Procedure Laterality Date  . BYPASS GRAFT    . CARDIAC CATHETERIZATION N/A 07/12/2015   rocedure: Left Heart Cath and Cors/Grafts Angiography;  Surgeon: HBelva Crome MD;  [place];  Service: Cardiovascular;  Laterality: N/A;  . CARDIAC CATHETERIZATION Right 10/07/2015  Procedure: Left Heart Cath and Cors/Grafts Angiography;  Surgeon: Dionisio David, MD;  Location: [place] CV LAB;  Service: Cardiovascular;  Laterality: Right;  . CARDIAC CATHETERIZATION N/A 04/06/2016   Procedure: Left Heart Cath and Coronary Angiography;  Surgeon: Yolonda Kida, MD;  Location: [place] CV LAB;  Service: Cardiovascular;  Laterality: N/A;  . CARDIAC CATHETERIZATION  04/06/2016   Procedure: Bypass Graft Angiography;  Surgeon: Yolonda Kida, MD;  Location: [place] CV LAB;  Service: Cardiovascular;;  . CARDIAC CATHETERIZATION N/A 11/02/2016   Procedure: Left Heart Cath and Cors/Grafts Angiography and possible PCI;  Surgeon: Yolonda Kida, MD;  Location: [place] CV LAB;  Service: Cardiovascular;  Laterality: N/A;  . CARDIAC CATHETERIZATION N/A 11/02/2016   Procedure: Coronary Stent Intervention;  Surgeon: Yolonda Kida, MD;  Location: [place] CV LAB;  Service: Cardiovascular;  Laterality: N/A;  . CHOLECYSTECTOMY    . CIRCUMCISION N/A 06/09/2019   Procedure: CIRCUMCISION ADULT;  Surgeon: Billey Co, MD;  Location: [facility] ORS;  Service: Urology;  Laterality: N/A;  . COLONOSCOPY WITH PROPOFOL N/A 04/01/2018   Procedure: COLONOSCOPY WITH PROPOFOL;  Surgeon: Manya Silvas, MD;  Location: [facility] ENDOSCOPY;  Service: Endoscopy;  Laterality: N/A;  . ESOPHAGEAL DILATION    . ESOPHAGOGASTRODUODENOSCOPY (EGD) WITH PROPOFOL N/A 04/01/2018   Procedure: ESOPHAGOGASTRODUODENOSCOPY (EGD) WITH PROPOFOL;  Surgeon: Manya Silvas, MD;  Location: [facility] END[facility]COPY;  Service: Endoscopy;  Laterality: N/A;  . LEFT HEART CATH AND CORS/GRAFTS ANGIOGRAPHY N/A 06/12/2019   Procedure: LEFT HEART CATH AND CORS/GRAFTS ANGIOGRAPHY;  Surgeon: Teodoro Spray, MD;  Location: [place] CV LAB;  Service: Cardiovascular;  Laterality: N/A;  . TONSILLECTOMY    . VASCULAR SURGERY     Family History  Problem Relation Age of Onset  . Heart attack Mother   . Depression Mother   . Heart disease Mother   . COPD Mother   . Hypertension Mother   . Heart attack Father   . Diabetes Father   . Depression Father   . Heart disease Father   . Cirrhosis Father   . Parkinson's disease Brother    Social History   Tobacco Use  . Smoking status: Former Smoker    Packs/day: 3.00    Years: 50.00    Pack years: 150.00    Types: Cigarettes    Quit date: 04/22/2013    Years since quitting: 6.1  . Smokeless tobacco: Never Used  Substance Use Topics  . Alcohol use: No    Comment: remotely quit alcohol use. Hx of heavy alcohol use.   Allergies  Allergen Reactions  . Demerol  [Meperidine Hcl]   . Demerol [Meperidine] Hives  . Jardiance  [Empagliflozin] Other (See Comments)    Perineal pain  . Prednisone Other (See Comments) and Hypertension    Pt states that this medication puts him in A-fib   . Sulfa Antibiotics Hives  . Albuterol Sulfate [Albuterol] Palpitations and Other (See Comments)    Pt currently uses this medication.    . Morphine Sulfate Nausea And Vomiting, Rash and Other (See Comments)    Pt states that he is only allergic to the tablet form of this medication.       Review of Systems  Constitutional: Positive for fatigue. Negative for appetite change.  HENT: Negative for congestion, rhinorrhea and sore throat.   Eyes: Positive for pain (left eye), redness (lateral part of eye) and visual disturbance (left eye with blurry vision).  Respiratory: Positive  for cough and shortness of breath (with moderate exertion). Negative for chest tightness and wheezing.   Cardiovascular: Negative for chest pain, palpitations and leg swelling.  Gastrointestinal: Positive for abdominal distention. Negative for abdominal pain.  Endocrine: Negative.   Genitourinary: Negative.   Musculoskeletal: Positive for back pain (chronic). Negative for neck pain.  Skin: Negative.   Allergic/Immunologic: Negative.   Neurological: Negative for dizziness, weakness and light-headedness.  Hematological: Negative for adenopathy. Bruises/bleeds easily.  Psychiatric/Behavioral: Positive for sleep disturbance (wearing oxygen at 2L; not wearing CPAP due to space issues). Negative for dysphoric mood and suicidal ideas. The patient is not nervous/anxious.      Physical Exam  Constitutional: He is oriented to person, place, and time. He appears well-developed and well-nourished.  HENT:  Head: Normocephalic and atraumatic.  Eyes:  Upper and lower left eyelid is swollen with redness noticed along the lateral side of the eye  Neck: Normal range of motion. Neck supple. No JVD present.  Cardiovascular: Normal rate and regular rhythm.   Pulmonary/Chest: Effort normal. He has no wheezes. He has no rales.  Abdominal: Soft. He exhibits distension. There is no abdominal tenderness.  Musculoskeletal:        General: No tenderness or edema.  Neurological: He is alert and oriented to person, place, and time.  Skin: Skin is warm and dry.  Psychiatric: He has a normal mood and affect. His behavior is normal. Thought content normal.  Nursing note and vitals reviewed.  Assessment & Plan:  1: Chronic heart failure with preserved ejection fraction- - NYHA class II - mildly fluid overloaded today although stable - weighing daily; Reminded to call for an overnight weight gain of >2 pounds or a weekly weight gain of >5 pounds.  - weight 249.8  pounds since he was last here 10 months ago - not adding salt and is currently rinsing canned foods. Reviewed the importance of closely following a 2065m sodium diet - drinking ~ 64 ounces of liquids daily - saw cardiologist (Lyndel Pleasure 07/04/18 - BNP 06/10/2019 was 141.0   2: HTN- - BP  - saw PCP ([name] 05/29/2019 - BMP 06/12/2019 reviewed and showed sodium 140, potassium 4.4, creatinine 1.07  and GFR >60  3: COPD- - had pulmonary function test performed 04/13/18 which showed normal forced vital capacity. FEV1 is 2.03 L which is 70% of predicted and is mildly decreased.  - continues to wear oxygen at 2L around the clock although doesn't have it on today - saw pulmonologist (Humphrey Rolls 06/20/18  4: Diabetes-  - fasting glucose at home yesterday was  - A1c 05/29/2019 was 7.9%  5: Obstructive sleep apnea- - patient is not currently using his CPAP at night for his OSA because he needs a new cord. He called company [employer] who stated that cord was $70. Patient states that he cannot afford this.  - patient is to call [employer] back and get information about what type of cord this is so we can see about getting it paid for - discussed how untreated sleep apnea can affect his heart    Patient did not  bring his medications nor a list. Each medication was verbally reviewed with the patient and he was encouraged to bring the bottles to every visit to confirm accuracy of list.

## 2019-06-23 ENCOUNTER — Ambulatory Visit: Payer: Medicare HMO | Admitting: Family

## 2019-06-26 ENCOUNTER — Ambulatory Visit (INDEPENDENT_AMBULATORY_CARE_PROVIDER_SITE_OTHER): Payer: Medicare HMO | Admitting: Family Medicine

## 2019-06-26 ENCOUNTER — Encounter: Payer: Self-pay | Admitting: Family Medicine

## 2019-06-26 ENCOUNTER — Other Ambulatory Visit: Payer: Self-pay

## 2019-06-26 ENCOUNTER — Other Ambulatory Visit: Payer: Self-pay | Admitting: Family Medicine

## 2019-06-26 VITALS — BP 122/64 | HR 72 | Temp 98.1°F | Resp 16 | Wt 244.0 lb

## 2019-06-26 DIAGNOSIS — I25118 Atherosclerotic heart disease of native coronary artery with other forms of angina pectoris: Secondary | ICD-10-CM | POA: Diagnosis not present

## 2019-06-26 DIAGNOSIS — I209 Angina pectoris, unspecified: Secondary | ICD-10-CM

## 2019-06-26 DIAGNOSIS — E782 Mixed hyperlipidemia: Secondary | ICD-10-CM | POA: Diagnosis not present

## 2019-06-26 DIAGNOSIS — E119 Type 2 diabetes mellitus without complications: Secondary | ICD-10-CM | POA: Diagnosis not present

## 2019-06-26 DIAGNOSIS — J418 Mixed simple and mucopurulent chronic bronchitis: Secondary | ICD-10-CM | POA: Diagnosis not present

## 2019-06-26 DIAGNOSIS — I480 Paroxysmal atrial fibrillation: Secondary | ICD-10-CM | POA: Diagnosis not present

## 2019-06-26 DIAGNOSIS — I10 Essential (primary) hypertension: Secondary | ICD-10-CM | POA: Diagnosis not present

## 2019-06-26 NOTE — Progress Notes (Signed)
Patient: Lawrence [name] Male    DOB: 1956-03-03   63 y.o.   MRN: [id number] Visit Date: 06/26/2019  Today's Provider: [name], MD   Chief Complaint  Patient presents with  . Hospitalization Follow-up   Subjective:     HPI     Follow up Hospitalization  Patient was admitted to [facility] on 06/10/2019 and discharged on 06/12/2019. He was treated for Unstable Angina. Treatment for this included Cardiac cath Telephone follow up was done on None He reports excellent compliance with treatment. He reports this condition is Improved.  ------------------------------------------------------------------------------------     Allergies  Allergen Reactions  . Demerol  [Meperidine Hcl]   . Demerol [Meperidine] Hives  . Jardiance [Empagliflozin] Other (See Comments)    Perineal pain  . Prednisone Other (See Comments) and Hypertension    Pt states that this medication puts him in A-fib   . Sulfa Antibiotics Hives  . Albuterol Sulfate [Albuterol] Palpitations and Other (See Comments)    Pt currently uses this medication.    . Morphine Sulfate Nausea And Vomiting, Rash and Other (See Comments)    Pt states that he is only allergic to the tablet form of this medication.       Current Outpatient Medications:  .  Albuterol Sulfate, sensor, 108 (90 Base) MCG/ACT AEPB, Inhale 2 puffs into the lungs at bedtime. AND PRN, Disp: , Rfl:  .  allopurinol (ZYLOPRIM) 300 MG tablet, Take 1 tablet (300 mg total) by mouth 2 (two) times daily., Disp: 180 tablet, Rfl: 4 .  ALPRAZolam (XANAX) 1 MG tablet, Take 1 tablet (1 mg total) by mouth 3 (three) times daily., Disp: 90 tablet, Rfl: 4 .  apixaban (ELIQUIS) 5 MG TABS tablet, Take 5 mg by mouth 2 (two) times daily. , Disp: , Rfl:  .  aspirin 81 MG chewable tablet, Chew 81 mg by mouth daily., Disp: , Rfl:  .  atorvastatin (LIPITOR) 80 MG tablet, TAKE ONE TABLET BY MOUTH AT BEDTIME. (Patient taking differently: Take 80 mg by mouth at bedtime.  ), Disp: 90 tablet, Rfl: 4 .  Blood Glucose Monitoring Suppl (ACCU-CHEK AVIVA PLUS) w/Device KIT, CHECK BLOOD SUGAR EVERY DAY, Disp: 1 kit, Rfl: 0 .  BREO ELLIPTA 100-25 MCG/INH AEPB, INHALE 1 PUFF INTO THE LUNGS DAILY, Disp: 60 each, Rfl: 2 .  cetirizine (ZYRTEC) 10 MG tablet, Take 10 mg by mouth at bedtime. , Disp: , Rfl:  .  Cholecalciferol (VITAMIN D3) 125 MCG (5000 UT) CAPS, Take 5,000 Units by mouth daily., Disp: , Rfl:  .  dapagliflozin propanediol (FARXIGA) 10 MG TABS tablet, Take 10 mg by mouth daily. (Patient taking differently: Take 10 mg by mouth every morning. ), Disp: 30 tablet, Rfl: 12 .  docusate sodium (COLACE) 100 MG capsule, Take 100 mg by mouth daily. , Disp: , Rfl:  .  glucose blood (ACCU-CHEK AVIVA PLUS) test strip, Use to check blood sugar once a day, Disp: 100 each, Rfl: 4 .  isosorbide mononitrate (IMDUR) 120 MG 24 hr tablet, TAKE 1 TABLET BY MOUTH DAILY (Patient taking differently: Take 120 mg by mouth every morning. ), Disp: 30 tablet, Rfl: 5 .  LINZESS 72 MCG capsule, Take 72 mcg by mouth daily before breakfast. , Disp: , Rfl:  .  lisinopril (PRINIVIL,ZESTRIL) 2.5 MG tablet, TAKE 1 TABLET BY MOUTH DAILY (Patient taking differently: Take 2.5 mg by mouth every morning. ), Disp: 30 tablet, Rfl: 11 .  magnesium oxide (MAG-OX) 400 (  241.3 Mg) MG tablet, Take 1 tablet (400 mg total) by mouth every morning., Disp: 30 tablet, Rfl: 5 .  methocarbamol (ROBAXIN) 500 MG tablet, Take 1 tablet (500 mg total) by mouth 4 (four) times daily., Disp: 60 tablet, Rfl: 3 .  nitroGLYCERIN (NITROSTAT) 0.4 MG SL tablet, Place 1 tablet (0.4 mg total) under the tongue every 5 (five) minutes as needed for chest pain. Reported on 05/26/2016, Disp: 30 tablet, Rfl: 3 .  omega-3 acid ethyl esters (LOVAZA) 1 g capsule, Take 4 capsules (4 g total) by mouth daily., Disp: 120 capsule, Rfl: 12 .  omeprazole (PRILOSEC) 40 MG capsule, Take 40 mg by mouth 2 (two) times a day. , Disp: , Rfl:  .   oxyCODONE-acetaminophen (PERCOCET) 10-325 MG tablet, Take 1 tablet by mouth every 4 (four) hours., Disp: 150 tablet, Rfl: 0 .  OXYGEN, Inhale 2 L into the lungs continuous. , Disp: , Rfl:  .  tiotropium (SPIRIVA HANDIHALER) 18 MCG inhalation capsule, Place 1 capsule (18 mcg total) into inhaler and inhale daily. (Patient taking differently: Place 18 mcg into inhaler and inhale every morning. ), Disp: 30 capsule, Rfl: 12 .  torsemide (DEMADEX) 100 MG tablet, Take 50 mg by mouth daily as needed (for fluid). , Disp: , Rfl:  .  traZODone (DESYREL) 100 MG tablet, Take 1 tablet (100 mg total) by mouth at bedtime. For sleep, Disp: 30 tablet, Rfl: 0 .  venlafaxine XR (EFFEXOR-XR) 75 MG 24 hr capsule, Take 1 capsule (75 mg total) by mouth daily with breakfast., Disp: 90 capsule, Rfl: 3 .  ranolazine (RANEXA) 500 MG 12 hr tablet, Take 1 tablet (500 mg total) by mouth 2 (two) times daily. (Patient not taking: Reported on 06/26/2019), Disp: 60 tablet, Rfl: 0  Review of Systems  Constitutional: Negative.   Respiratory: Positive for shortness of breath. Negative for apnea, cough, choking, chest tightness, wheezing and stridor.   Cardiovascular: Positive for chest pain (Occasionally; pt reports it has improved since his hospitalization.). Negative for palpitations and leg swelling.  Gastrointestinal: Negative.   Musculoskeletal: Negative.   Neurological: Positive for dizziness. Negative for light-headedness and headaches.    Social History   Tobacco Use  . Smoking status: Former Smoker    Packs/day: 3.00    Years: 50.00    Pack years: 150.00    Types: Cigarettes    Quit date: 04/22/2013    Years since quitting: 6.1  . Smokeless tobacco: Never Used  Substance Use Topics  . Alcohol use: No    Comment: remotely quit alcohol use. Hx of heavy alcohol use.      Objective:   BP 122/64 (BP Location: Right Arm, Patient Position: Sitting, Cuff Size: Large)   Pulse 72   Temp 98.1 F (36.7 C) (Oral)   Resp  16   Wt 244 lb (110.7 kg)   BMI 38.22 kg/m  Vitals:   06/26/19 1602  BP: 122/64  Pulse: 72  Resp: 16  Temp: 98.1 F (36.7 C)  TempSrc: Oral  Weight: 244 lb (110.7 kg)     Physical Exam  General Appearance:    Alert, cooperative, no distress, obese  Eyes:    PERRL, conjunctiva/corneas clear, EOM's intact       Lungs:     Clear to auscultation bilaterally, respirations unlabored  Heart:    Normal heart rate. Regular rhythm. No murmurs, rubs, or gallops.   MS:   All extremities are intact.   Neurologic:   Awake, alert, oriented x  3. No apparent focal neurological           defect.            Assessment & Plan    1. Angina pectoris ([place]) Resolved since recent hospitalization. Continue current medications.  Continue routine cardiology follow up.      [name], MD  [facility]

## 2019-06-27 ENCOUNTER — Telehealth: Payer: Self-pay | Admitting: Urology

## 2019-06-27 ENCOUNTER — Encounter: Payer: Self-pay | Admitting: Urology

## 2019-06-27 ENCOUNTER — Ambulatory Visit (INDEPENDENT_AMBULATORY_CARE_PROVIDER_SITE_OTHER): Payer: Medicare HMO | Admitting: Urology

## 2019-06-27 VITALS — BP 139/79 | HR 65 | Ht 67.0 in | Wt 244.0 lb

## 2019-06-27 DIAGNOSIS — N481 Balanitis: Secondary | ICD-10-CM | POA: Diagnosis not present

## 2019-06-27 MED ORDER — NYSTATIN-TRIAMCINOLONE 100000-0.1 UNIT/GM-% EX OINT: 1.0000 "application " | TOPICAL_OINTMENT | Freq: Two times a day (BID) | CUTANEOUS | 0 refills | Status: DC

## 2019-06-27 NOTE — Progress Notes (Signed)
   06/27/2019 2:19 PM   [name] 1956/08/14 [id number]  Reason for visit: Postop visit after dorsal slit  HPI: I saw [name] in urology clinic for wound check after undergoing a dorsal slit on 06/09/2019.  He is an extremely comorbid 63 year old man with cardiac history, COPD, and poorly controlled diabetes who underwent a dorsal slit for severe phimosis, as well as multiple ER visits for paraphimosis that required urologic reduction.  He was hospitalized after surgery for chest pain, and extensive work-up and cardiac catheterization were negative for any acute ischemia.  He continues to be bothered by penile pain at the head of the penis, though he reports his stream has strengthen significantly and he is voiding well.  On exam, he has a well-healed dorsal slit with easily visible glans.  There is some ulceration around the glans consistent with likely balanitis.  Mycolog cream twice daily x2 weeks RTC 3 to 4 weeks for symptom check  A total of 15 minutes were spent face-to-face with the patient, greater than 50% was spent in patient education, counseling, and coordination of care regarding post op expectations, balanitis.  Billey Co, [facility] [address], [place], [place] [address] [phone]

## 2019-06-27 NOTE — Telephone Encounter (Signed)
Anderson Malta from pharmacy called and states that they do not have the Nystatin ointment that was called in for pt, but they do have the cream if you can change rx. Please advise Ph# [phone]

## 2019-06-27 NOTE — Patient Instructions (Signed)
Balanitis  Balanitis is swelling and irritation (inflammation) of the head of the penis (glans penis). The condition may also cause inflammation of the skin around the glans penis (foreskin) in men who have not been circumcised. It may develop because of an infection or another medical condition. Balanitis occurs most often among men who have not had their foreskin removed (uncircumcised men). Balanitis sometimes causes scarring of the penis or foreskin, which can require surgery. Untreated balanitis can increase the risk of penile cancer. What are the causes? Common causes of this condition include:  Poor personal hygiene, especially in uncircumcised men. Not cleaning the glans penis and foreskin well can result in buildup of bacteria, viruses, and yeast, which can lead to infection and inflammation.  Irritation and lack of air flow due to fluid (smegma) that can build up on the glans penis. Other causes include:  Chemical irritation from products such as soaps or shower gels (especially those that have fragrance), condoms, personal lubricants, petroleum jelly, spermicides, or fabric softeners.  Skin conditions, such as eczema, dermatitis, and psoriasis.  Allergies to medicines, such as tetracycline and sulfa drugs.  Certain medical conditions, including liver cirrhosis, congestive heart failure, diabetes, and kidney disease.  Infections, such as candidiasis, HPV (human papillomavirus), herpes simplex, gonorrhea, and syphilis.  Severe obesity. What increases the risk? The following factors may make you more likely to develop this condition:  Having diabetes. This is the most common risk factor.  Having a tight foreskin that is difficult to pull back (retract) past the glans.  Having sexual intercourse without using a condom. What are the signs or symptoms? Symptoms of this condition include:  Discharge from under the foreskin.  A bad smell.  Pain or difficulty retracting the  foreskin.  Tenderness, redness, and swelling of the glans.  A rash or sores on the glans or foreskin.  Itchiness.  Inability to get an erection due to pain.  Difficulty urinating.  Scarring of the penis or foreskin, in some cases. How is this diagnosed? This condition may be diagnosed based on:  A physical exam.  Testing a swab of discharge to check for bacterial or fungal infection.  Blood tests: ? To check for viruses that can cause balanitis. ? To check your blood sugar (glucose) level. High blood glucose could be a sign of diabetes, which can cause balanitis. How is this treated? Treatment for balanitis depends on the cause. Treatment may include:  Improving personal hygiene. Your health care provider may recommend sitting in a bath of warm water that is deep enough to cover your hips and buttocks (sitz bath).  Medicines such as: ? Creams or ointments to reduce swelling (steroids) or to treat an infection. ? Antibiotic medicine. ? Antifungal medicine.  Surgery to remove or cut the foreskin (circumcision). This may be done if you have scarring on the foreskin that makes it difficult to retract.  Controlling other medical problems that may be causing your condition or making it worse. Follow these instructions at home:  Do not have sex until the condition clears up, or until your health care provider approves.  Keep your penis clean and dry. Take sitz baths as recommended by your health care provider.  Avoid products that irritate your skin or make symptoms worse, such as soaps and shower gels that have fragrance.  Take over-the-counter and prescription medicines only as told by your health care provider. ? If you were prescribed an antibiotic medicine or a cream or ointment, use it as  told by your health care provider. Do not stop using your medicine, cream, or ointment even if you start to feel better. ? Do not drive or use heavy machinery while taking prescription  pain medicine. Contact a health care provider if:  Your symptoms get worse or do not improve with home care.  You develop chills or a fever.  You have trouble urinating.  You cannot retract your foreskin. Get help right away if:  You develop severe pain.  You are unable to urinate. Summary  Balanitis is inflammation of the head of the penis (glans penis) caused by irritation or infection.  Balanitis causes pain, redness, and swelling of the glans penis.  This condition is most common among uncircumcised men who do not keep their glans penis clean and in men who have diabetes.  Treatment may include creams or ointments.  Good hygiene is important for prevention. This includes pulling back the foreskin when washing your penis. This information is not intended to replace advice given to you by your health care provider. Make sure you discuss any questions you have with your health care provider. Document Released: 03/21/2009 Document Revised: 10/15/2017 Document Reviewed: 09/21/2016 Elsevier Patient Education  2020 [employer].

## 2019-06-27 NOTE — Telephone Encounter (Signed)
Ok to change rx from ointment to cream? Thanks

## 2019-06-28 NOTE — Telephone Encounter (Signed)
Yes thanks 

## 2019-06-29 MED ORDER — NYSTATIN-TRIAMCINOLONE 100000-0.1 UNIT/GM-% EX CREA: 1.0000 "application " | TOPICAL_CREAM | Freq: Two times a day (BID) | CUTANEOUS | 0 refills | Status: DC

## 2019-06-29 NOTE — Telephone Encounter (Signed)
RX changed to cream and re sent to pharmacy.

## 2019-07-05 NOTE — Patient Instructions (Signed)
.   Please review the attached list of medications and notify my office if there are any errors.   . Please bring all of your medications to every appointment so we can make sure that our medication list is the same as yours.   . We will have flu vaccines available after Labor Day. Please go to your pharmacy or call the office in early September to schedule you flu shot.

## 2019-07-19 DIAGNOSIS — J42 Unspecified chronic bronchitis: Secondary | ICD-10-CM | POA: Diagnosis not present

## 2019-07-19 DIAGNOSIS — J449 Chronic obstructive pulmonary disease, unspecified: Secondary | ICD-10-CM | POA: Diagnosis not present

## 2019-07-20 ENCOUNTER — Ambulatory Visit: Payer: Medicare HMO | Admitting: Urology

## 2019-07-20 ENCOUNTER — Other Ambulatory Visit: Payer: Self-pay

## 2019-07-20 DIAGNOSIS — M5410 Radiculopathy, site unspecified: Secondary | ICD-10-CM

## 2019-07-20 DIAGNOSIS — G4700 Insomnia, unspecified: Secondary | ICD-10-CM

## 2019-07-20 MED ORDER — TRAZODONE HCL 100 MG PO TABS: 100.0000 mg | ORAL_TABLET | Freq: Every day | ORAL | 0 refills | Status: DC

## 2019-07-20 MED ORDER — OXYCODONE-ACETAMINOPHEN 10-325 MG PO TABS: 1.0000 | ORAL_TABLET | ORAL | 0 refills | Status: DC

## 2019-07-20 NOTE — Telephone Encounter (Signed)
Patient request refill

## 2019-07-25 ENCOUNTER — Ambulatory Visit: Payer: Medicare HMO | Admitting: Urology

## 2019-07-26 IMAGING — US US SCROTUM W/ DOPPLER COMPLETE
1 series · 14 of 25 positions shown · non-contrast
Comparison: None.

CLINICAL DATA: Right testicular pain and swelling for 1 day.
Vasectomy 35 years ago.

EXAM:
SCROTAL ULTRASOUND
DOPPLER ULTRASOUND OF THE TESTICLES
TECHNIQUE: Complete ultrasound examination of the testicles, epididymis, and
other scrotal structures was performed. Color and spectral Doppler
ultrasound were also utilized to evaluate blood flow to the
testicles.

[Series 1: us scrotum w/ doppler complete · 0.08mm/px · 14 of 67 slices shown]
[im 1/67]
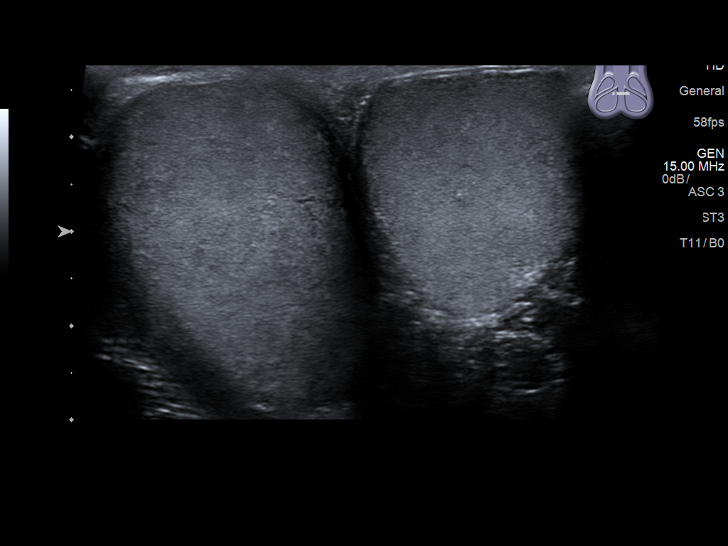
[im 6/67]
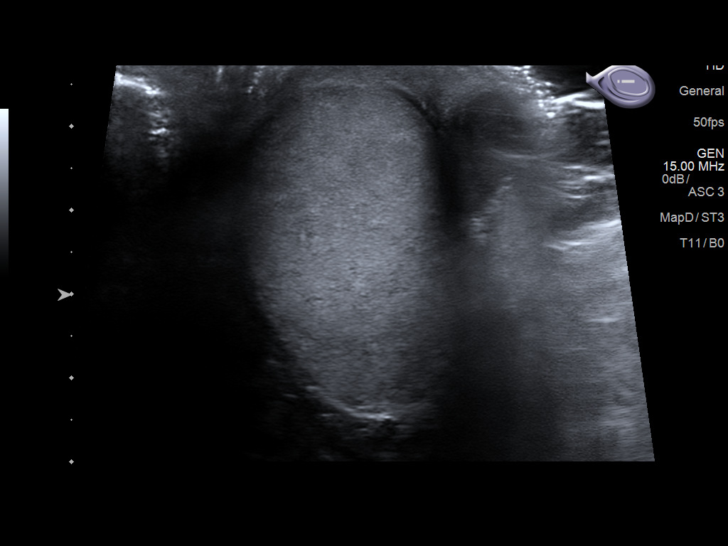
[im 12/67]
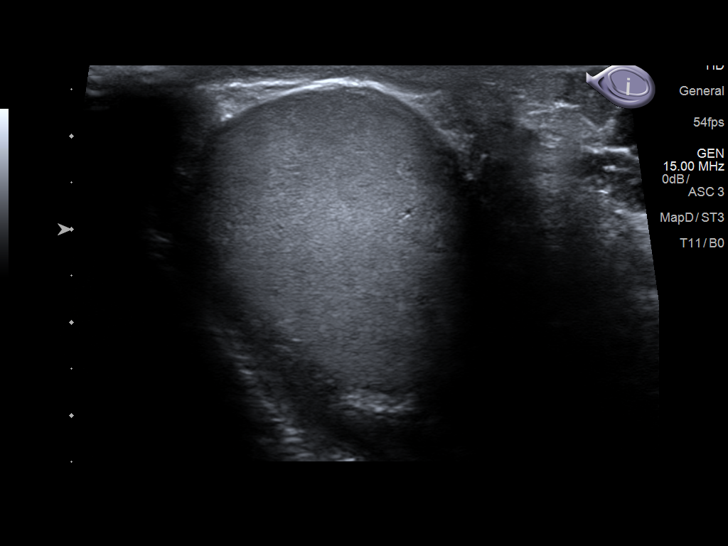
[im 17/67]
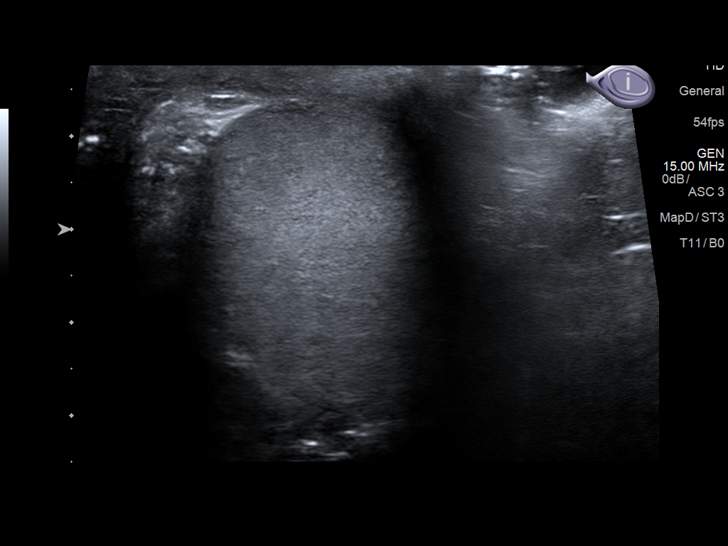
[im 23/67]
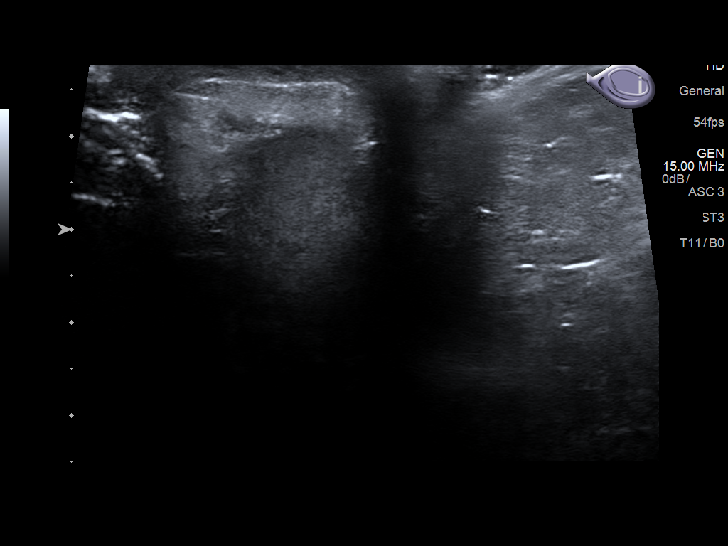
[im 25/67]
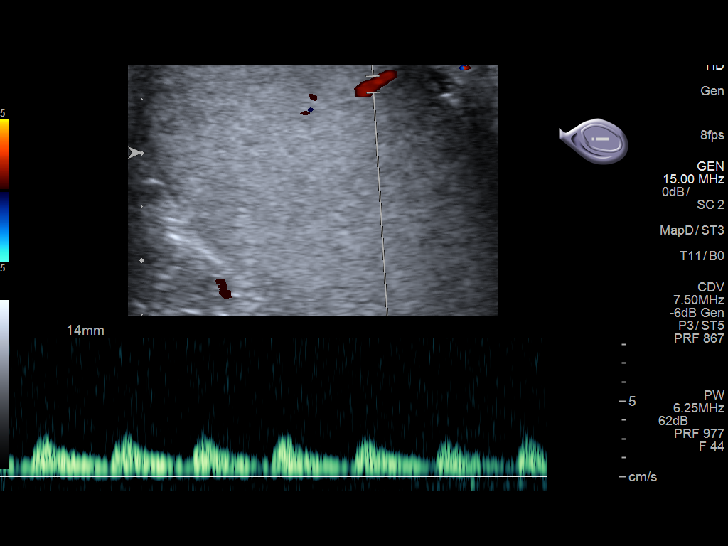
[im 31/67]
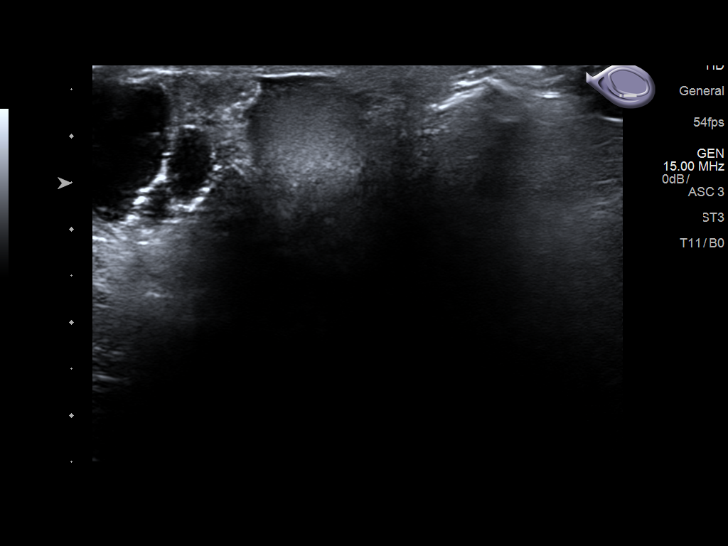
[im 36/67]
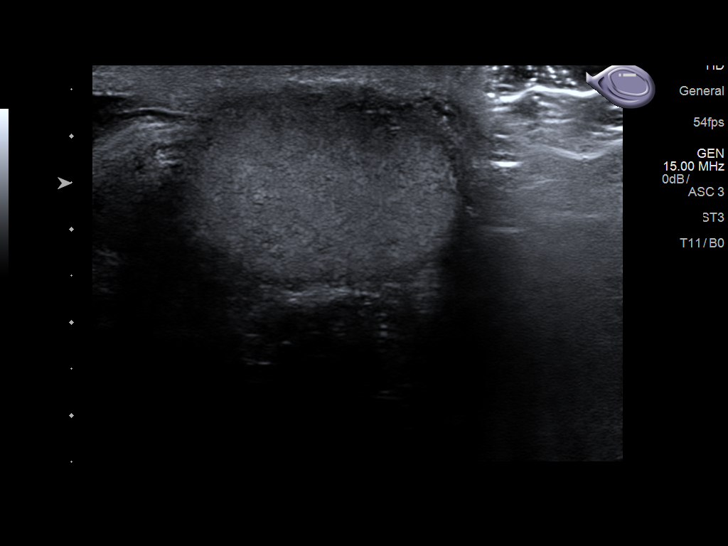
[im 42/67]
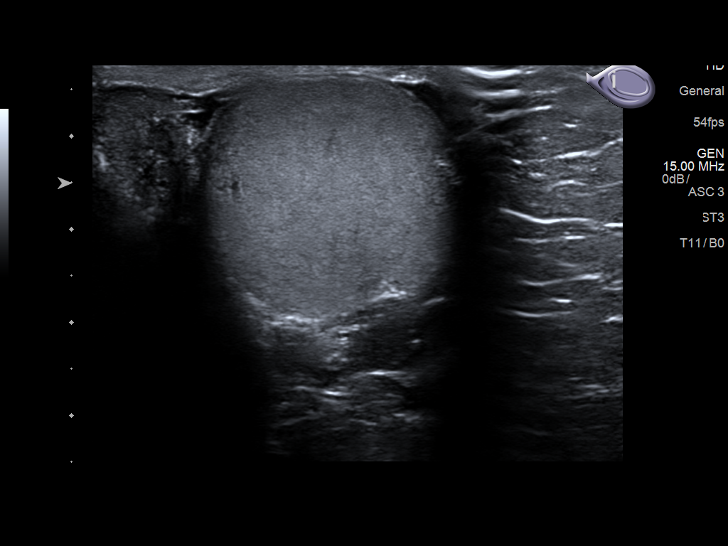
[im 45/67]
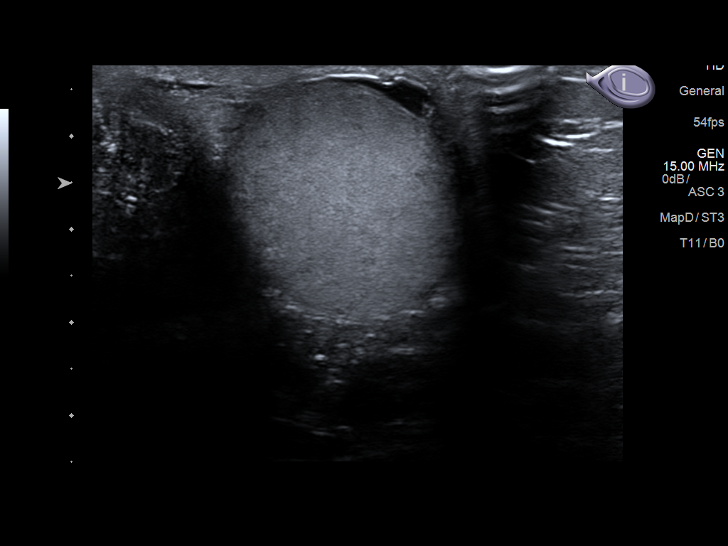
[im 50/67]
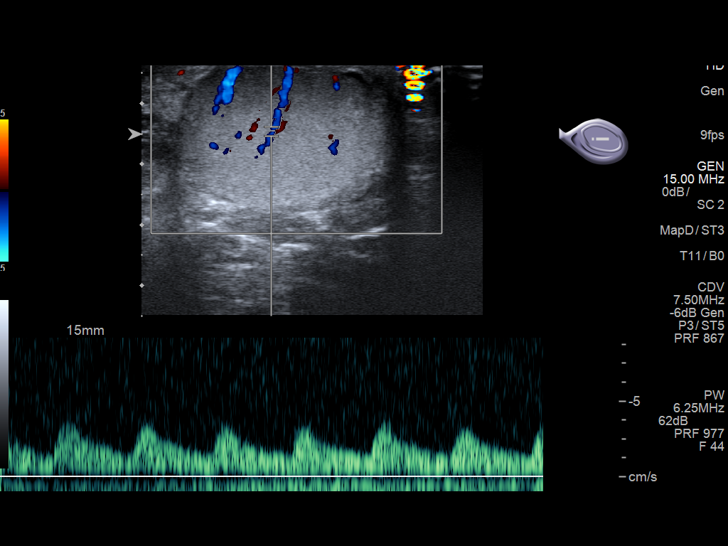
[im 56/67]
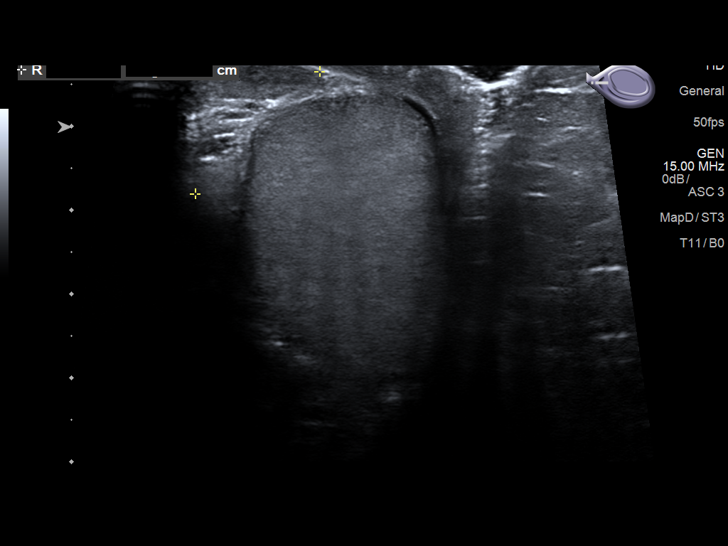
[im 61/67]
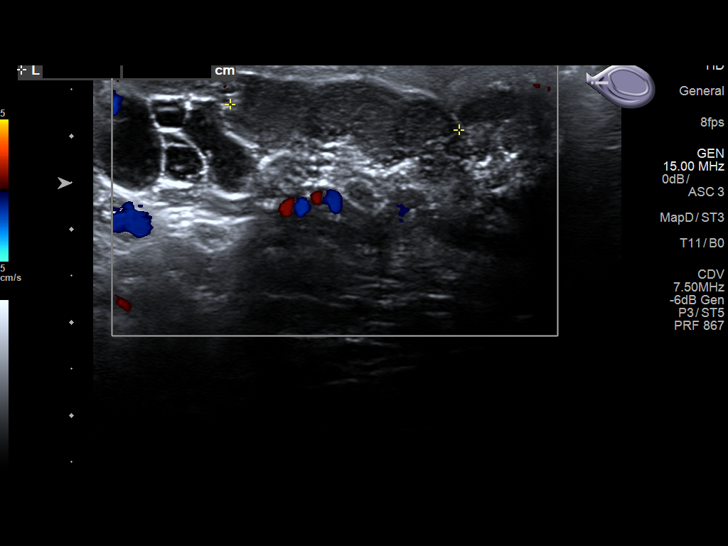
[im 67/67]
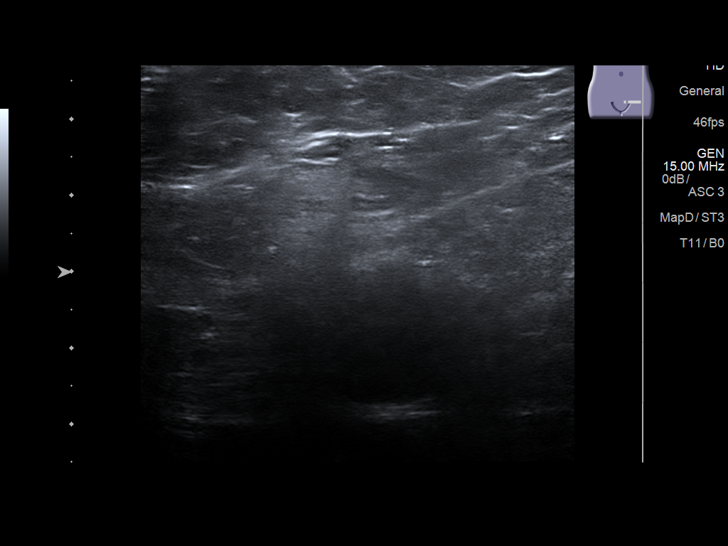

[14 of 25 positions shown; findings below may reference images not displayed]

FINDINGS: Right testicle

Measurements: 3.8 x 2.5 x 2.7 cm. No mass or microlithiasis
visualized.

Left testicle

Measurements: 3.9 x 2.4 x 2.4 cm. No mass or microlithiasis
visualized.

Right epididymis:  Normal in size and appearance.

Left epididymis:  Small epididymal cyst or spermatocele.

Hydrocele:  None visualized.

Varicocele:  None visualized.

Pulsed Doppler interrogation of both testes demonstrates normal low
resistance arterial and venous waveforms bilaterally. Homogeneous
normal symmetrical flow to both testicles on color flow Doppler
imaging.
IMPRESSION: Normal ultrasound appearance of the testicles. No evidence of
testicular mass, torsion, or inflammatory process.

## 2019-08-01 ENCOUNTER — Other Ambulatory Visit: Payer: Self-pay | Admitting: Family Medicine

## 2019-08-10 ENCOUNTER — Other Ambulatory Visit: Payer: Self-pay | Admitting: Family Medicine

## 2019-08-10 DIAGNOSIS — Z20828 Contact with and (suspected) exposure to other viral communicable diseases: Secondary | ICD-10-CM | POA: Diagnosis not present

## 2019-08-12 IMAGING — CR DG CHEST 2V
2 series · 2 of 2 positions shown · non-contrast
Comparison: 11/30/2016 and prior radiographs

CLINICAL DATA: Acute chest pain for 1 day.

EXAM:
CHEST  2 VIEW

[chest pa]
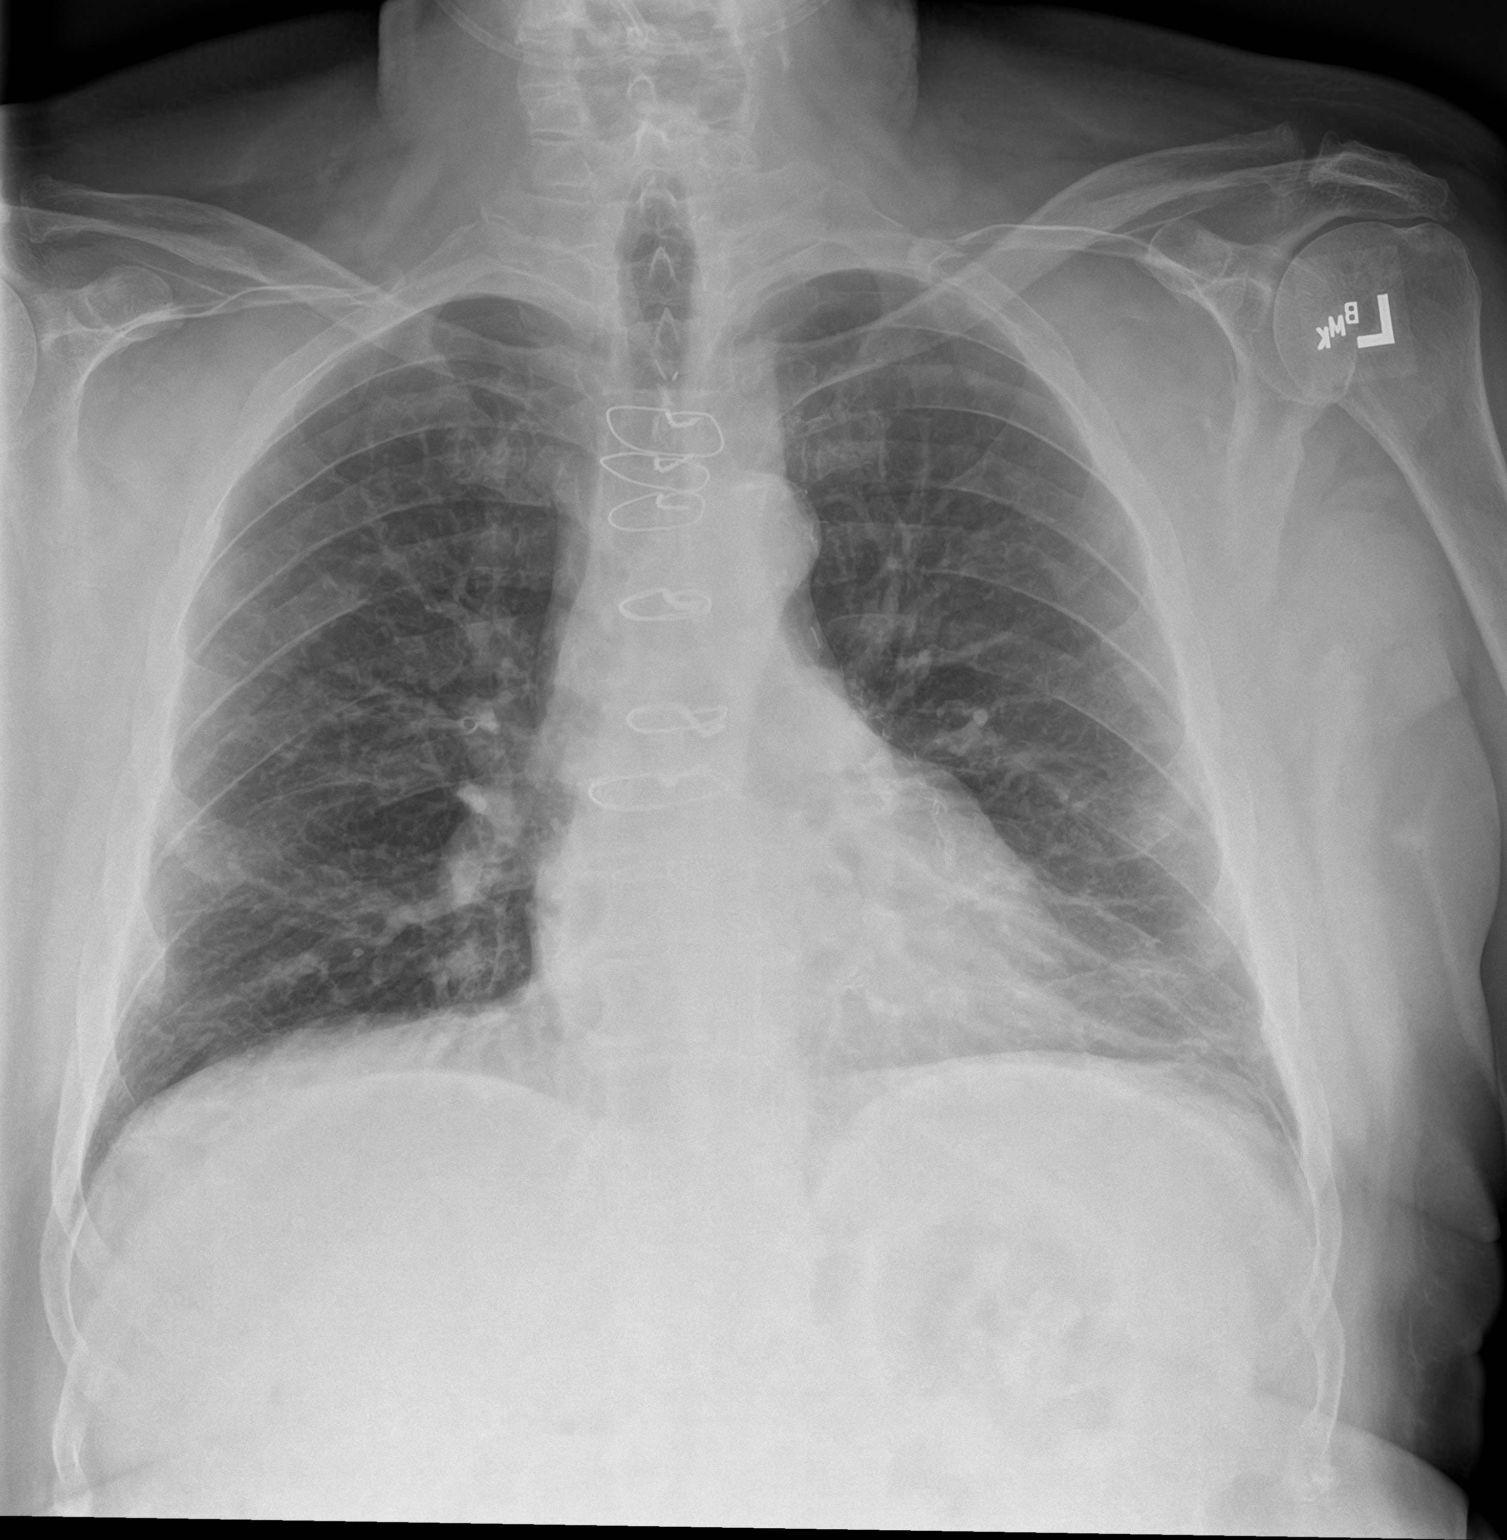

[chest lat]
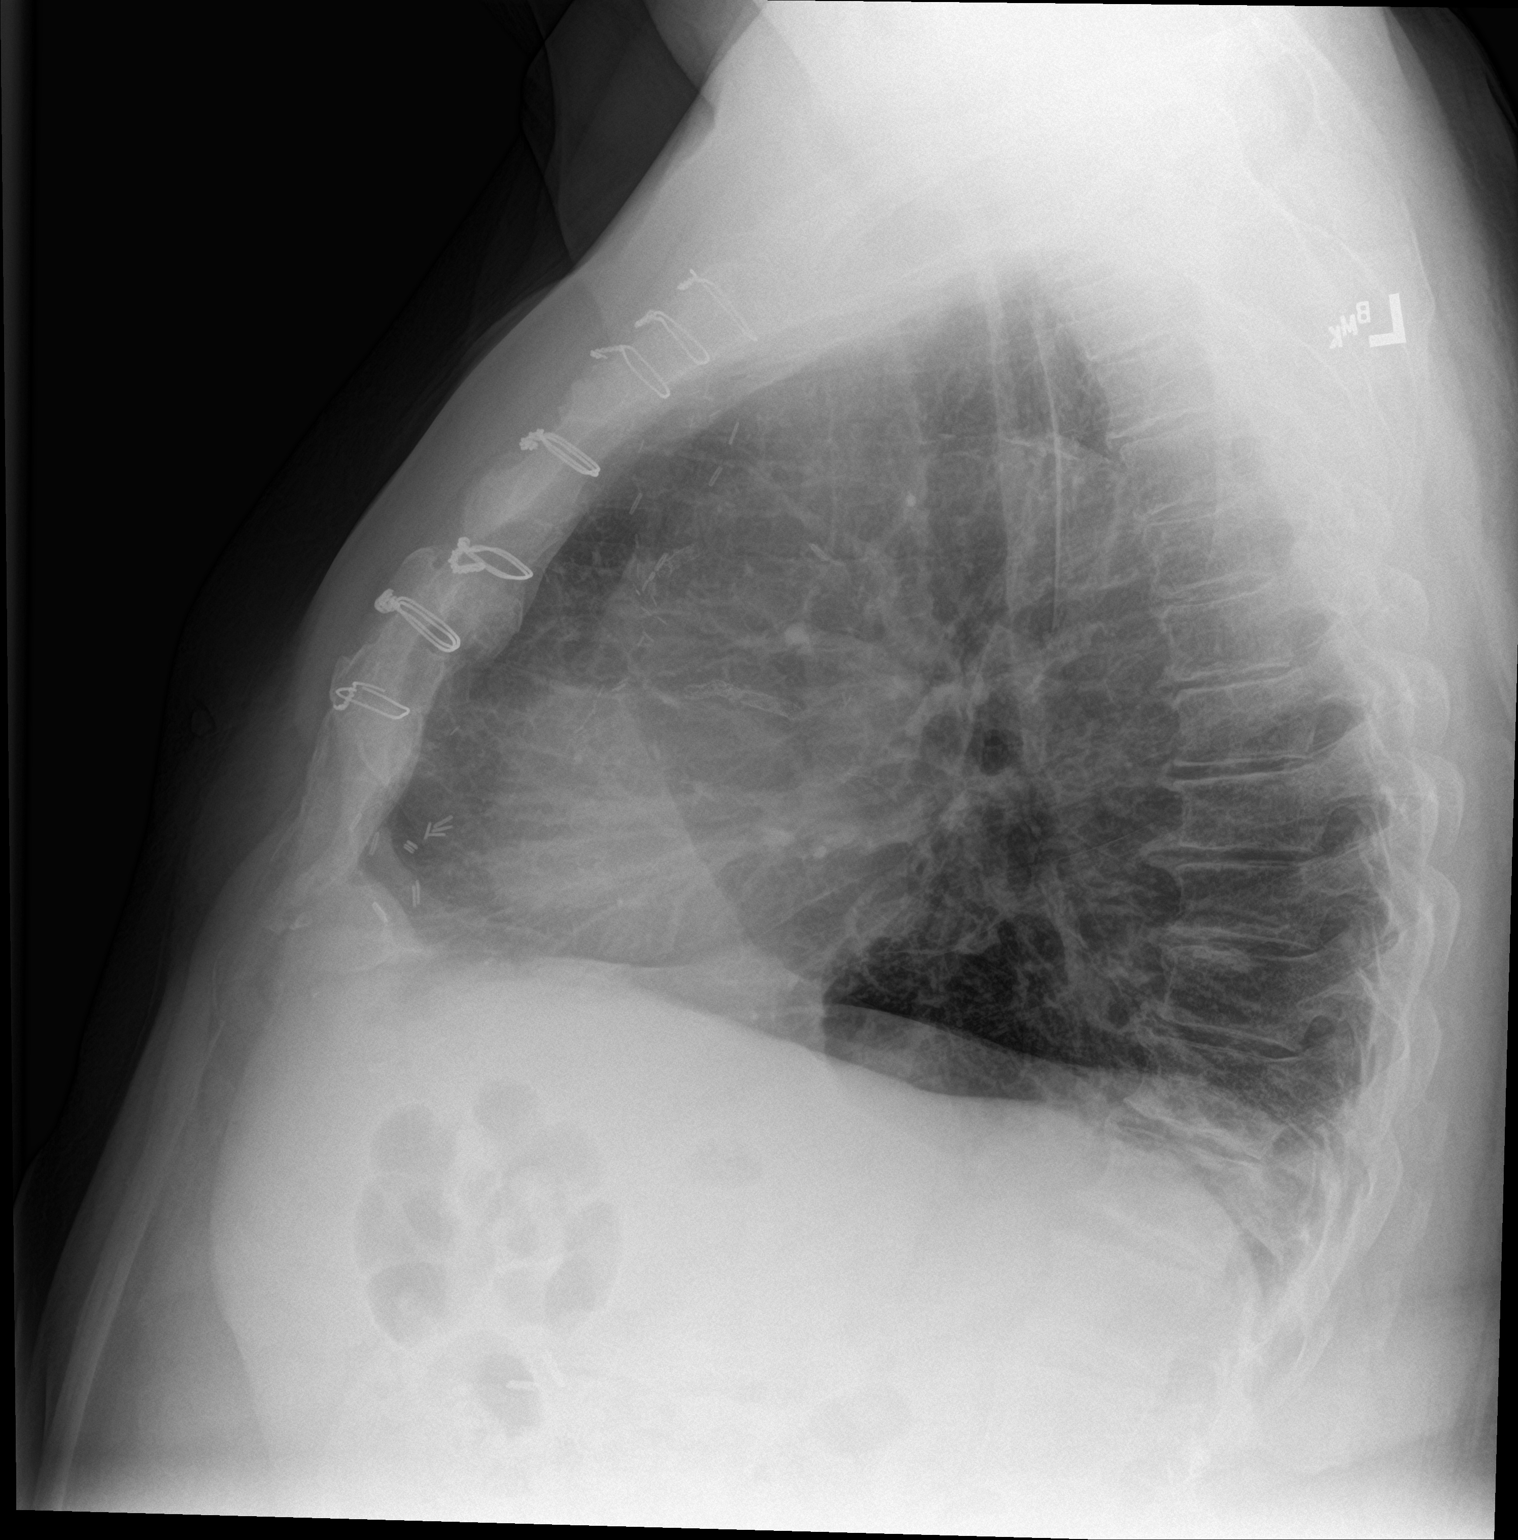

[2 of 2 positions shown; findings below may reference images not displayed]

FINDINGS: Cardiomediastinal silhouette is unchanged.

COPD/emphysema changes again noted.

There is no evidence of focal airspace disease, pulmonary edema,
suspicious pulmonary nodule/mass, pleural effusion, or pneumothorax.
No acute bony abnormalities are identified.
IMPRESSION: COPD/emphysema without evidence of acute cardiopulmonary disease.

## 2019-08-17 ENCOUNTER — Other Ambulatory Visit: Payer: Self-pay | Admitting: Family Medicine

## 2019-08-17 DIAGNOSIS — M5410 Radiculopathy, site unspecified: Secondary | ICD-10-CM

## 2019-08-17 NOTE — Telephone Encounter (Signed)
Refill oxycodone [place]

## 2019-08-18 DIAGNOSIS — J42 Unspecified chronic bronchitis: Secondary | ICD-10-CM | POA: Diagnosis not present

## 2019-08-18 DIAGNOSIS — J449 Chronic obstructive pulmonary disease, unspecified: Secondary | ICD-10-CM | POA: Diagnosis not present

## 2019-08-18 MED ORDER — OXYCODONE-ACETAMINOPHEN 10-325 MG PO TABS: 1.0000 | ORAL_TABLET | ORAL | 0 refills | Status: DC

## 2019-08-26 ENCOUNTER — Encounter: Payer: Self-pay | Admitting: Emergency Medicine

## 2019-08-26 ENCOUNTER — Emergency Department
Admission: EM | Admit: 2019-08-26 | Discharge: 2019-08-29 | Disposition: A | Discharge status: Home / Self Care | Payer: Medicare HMO | Attending: Emergency Medicine | Admitting: Emergency Medicine

## 2019-08-26 ENCOUNTER — Other Ambulatory Visit: Payer: Self-pay

## 2019-08-26 ENCOUNTER — Emergency Department: Payer: Medicare HMO

## 2019-08-26 DIAGNOSIS — I480 Paroxysmal atrial fibrillation: Secondary | ICD-10-CM | POA: Diagnosis not present

## 2019-08-26 DIAGNOSIS — I5032 Chronic diastolic (congestive) heart failure: Secondary | ICD-10-CM | POA: Diagnosis not present

## 2019-08-26 DIAGNOSIS — Z87891 Personal history of nicotine dependence: Secondary | ICD-10-CM | POA: Diagnosis not present

## 2019-08-26 DIAGNOSIS — Z7982 Long term (current) use of aspirin: Secondary | ICD-10-CM | POA: Diagnosis not present

## 2019-08-26 DIAGNOSIS — I130 Hypertensive heart and chronic kidney disease with heart failure and stage 1 through stage 4 chronic kidney disease, or unspecified chronic kidney disease: Secondary | ICD-10-CM | POA: Diagnosis not present

## 2019-08-26 DIAGNOSIS — J9611 Chronic respiratory failure with hypoxia: Secondary | ICD-10-CM | POA: Diagnosis not present

## 2019-08-26 DIAGNOSIS — I252 Old myocardial infarction: Secondary | ICD-10-CM | POA: Diagnosis not present

## 2019-08-26 DIAGNOSIS — R079 Chest pain, unspecified: Secondary | ICD-10-CM | POA: Diagnosis not present

## 2019-08-26 DIAGNOSIS — L03113 Cellulitis of right upper limb: Secondary | ICD-10-CM | POA: Insufficient documentation

## 2019-08-26 DIAGNOSIS — E1122 Type 2 diabetes mellitus with diabetic chronic kidney disease: Secondary | ICD-10-CM | POA: Insufficient documentation

## 2019-08-26 DIAGNOSIS — N182 Chronic kidney disease, stage 2 (mild): Secondary | ICD-10-CM | POA: Diagnosis not present

## 2019-08-26 DIAGNOSIS — I25810 Atherosclerosis of coronary artery bypass graft(s) without angina pectoris: Secondary | ICD-10-CM | POA: Diagnosis not present

## 2019-08-26 DIAGNOSIS — Z79899 Other long term (current) drug therapy: Secondary | ICD-10-CM | POA: Insufficient documentation

## 2019-08-26 DIAGNOSIS — L03114 Cellulitis of left upper limb: Secondary | ICD-10-CM | POA: Diagnosis not present

## 2019-08-26 DIAGNOSIS — R05 Cough: Secondary | ICD-10-CM | POA: Diagnosis not present

## 2019-08-26 DIAGNOSIS — J449 Chronic obstructive pulmonary disease, unspecified: Secondary | ICD-10-CM | POA: Diagnosis not present

## 2019-08-26 LAB — BASIC METABOLIC PANEL
Anion gap: 10 (ref 5–15)
BUN: 13 mg/dL (ref 8–23)
CO2: 27 mmol/L (ref 22–32)
Calcium: 9.4 mg/dL (ref 8.9–10.3)
Chloride: 100 mmol/L (ref 98–111)
Creatinine, Ser: 1.13 mg/dL (ref 0.61–1.24)
GFR calc Af Amer: >60 mL/min (ref >60)
GFR calc non Af Amer: >60 mL/min (ref >60)
Glucose, Bld: 267 mg/dL — ABNORMAL HIGH (ref 70–99)
Potassium: 4.8 mmol/L (ref 3.5–5.1)
Sodium: 137 mmol/L (ref 135–145)

## 2019-08-26 LAB — CBC
HCT: 54.1 % — ABNORMAL HIGH (ref 39.0–52.0)
Hemoglobin: 16.9 g/dL (ref 13.0–17.0)
MCH: 26.6 pg (ref 26.0–34.0)
MCHC: 31.2 g/dL (ref 30.0–36.0)
MCV: 85.2 fL (ref 80.0–100.0)
Platelets: 174 10*3/uL (ref 150–400)
RBC: 6.35 MIL/uL — ABNORMAL HIGH (ref 4.22–5.81)
RDW: 16 % — ABNORMAL HIGH (ref 11.5–15.5)
WBC: 6.8 10*3/uL (ref 4.0–10.5)
nRBC: 0 % (ref 0.0–0.2)

## 2019-08-26 LAB — TROPONIN I (HIGH SENSITIVITY)
Troponin I (High Sensitivity): 10 ng/L (ref <18)
Troponin I (High Sensitivity): 10 ng/L (ref <18)

## 2019-08-26 MED ORDER — HYDROMORPHONE HCL 1 MG/ML IJ SOLN
INTRAMUSCULAR | Status: AC
  Administered 2019-08-26: 0.5 mg via INTRAVENOUS
  Filled 2019-08-26: qty 1

## 2019-08-26 MED ORDER — AMOXICILLIN-POT CLAVULANATE 875-125 MG PO TABS
1.0000 | ORAL_TABLET | Freq: Once | ORAL | Status: AC
  Administered 2019-08-26: 1 via ORAL
  Filled 2019-08-26: qty 1

## 2019-08-26 MED ORDER — HYDROMORPHONE HCL 1 MG/ML IJ SOLN
1.0000 mg | Freq: Once | INTRAMUSCULAR | Status: AC
  Administered 2019-08-26: 1 mg via INTRAVENOUS
  Filled 2019-08-26: qty 1

## 2019-08-26 MED ORDER — AMOXICILLIN-POT CLAVULANATE 875-125 MG PO TABS: 1.0000 | ORAL_TABLET | Freq: Two times a day (BID) | ORAL | 0 refills | Status: AC

## 2019-08-26 MED ORDER — HYDROMORPHONE HCL 1 MG/ML IJ SOLN
0.5000 mg | Freq: Once | INTRAMUSCULAR | Status: AC
  Administered 2019-08-26: 18:00:00 0.5 mg via INTRAVENOUS

## 2019-08-26 NOTE — ED Notes (Signed)
RN called to ask lab about delay in troponin result. Lab reports results will be available in 11 minutes.

## 2019-08-26 NOTE — ED Provider Notes (Signed)
[facility] Emergency Department Provider Note   ____________________________________________   First MD Initiated Contact with Patient 08/26/19 1622     (approximate)  I have reviewed the triage vital signs and the nursing notes.   HISTORY  Chief Complaint Abscess and Chest Pain    HPI Lawrence [name] is a 63 y.o. male with past medical history of CAD status post CABG, atrial fibrillation, CHF, who presents to the ED complaining of arm pain and chest pain.  Patient states that he recently got a new puppy, who was scratched him multiple times over his right forearm.  Over the past couple of days, he has noticed increased redness, swelling, and pain to his proximal right forearm and is concerned it might be infected.  He has not noticed any drainage from this area, denies any fevers, chills, nausea, or vomiting.  He also states that he has had 2 to 3 days of intermittent pain over his left chest.  He describes this as a fluttering that seems to come with sharp pain and shortness of breath.  Symptoms can come on at rest, but seem to be worse with exertion.  He does state he has some ongoing symptoms currently with discomfort in his chest.  He denies any pain or swelling in his lower extremities.        Past Medical History:  Diagnosis Date  . A-fib ([place])   . Anemia   . Anginal pain ([place])   . Anxiety   . Arthritis   . Asthma   . CAD (coronary artery disease)    a. 2002 CABGx2 (LIMA->LAD, VG->VG->OM1);  b. 09/2012 DES->OM;  c. 03/2015 PTCA of LAD [facility]) in setting of atretic LIMA; d. 05/2015 Cath [facility]): nonobs dzs; e. 06/2015 Cath ([facility]): LM nl, LAD 45p/d ISR, 50d, D1/2 small, LCX 50p/d ISR, OM1 70ost, 30 ISR, VG->OM1 50ost, 66m LIMA->LAD 99p/d - atretic, RCA dom, nl; f.cath 10/16: 40-50%(FFR 0.90) pLAD, 75% (FFR 0.77) mLAD s/p PCI/DES, oRCA 40% (FFR0.95)  . Cancer ([place]    SKIN CANCER ON BACK  . Celiac disease   . Chronic diastolic CHF (congestive heart  failure) ([place]    a. 06/2009 Echo: EF 60-65%, Gr 1 DD, triv AI, mildly dil LA, nl RV.  .Marland KitchenCOPD (chronic obstructive pulmonary disease) ([place]    a. Chronic bronchitis and emphysema.  . DDD (degenerative disc disease), lumbar   . Diverticulosis   . Dysrhythmia   . Essential hypertension   . GERD (gastroesophageal reflux disease)   . History of hiatal hernia   . History of kidney stones    H/O  . History of tobacco abuse    a. Quit 2014.  .Marland KitchenMyocardial infarction ([place] 2002   4 STENTS  . Pancreatitis   . PSVT (paroxysmal supraventricular tachycardia) ([place]    a. 10/2012 Noted on Zio Patch.  . Sleep apnea    LOST CORD TO CPAP -ONLY 02 @ BEDTIME  . Tubular adenoma of colon   . Type II diabetes mellitus ([facility]     Patient Active Problem List   Diagnosis Date Noted  . Chronic respiratory failure with hypoxia ([place] 05/29/2019  . Morbid obesity ([place] 05/29/2019  . Trigger thumb of right hand 11/28/2018  . Eye pain, left 08/18/2018  . Acute on chronic heart failure ([place] 04/18/2018  . History of adenomatous polyp of colon 04/05/2018  . Abdominal pain, chronic, epigastric 11/06/2017  . Bilateral flank pain 03/24/2017  . Dyspnea 04/03/2016  .  Hypotension 04/03/2016  . CKD (chronic kidney disease) stage 2, GFR 60-89 ml/min 04/03/2016  . Anemia 04/03/2016  . Paroxysmal atrial fibrillation ([place]) 12/23/2015  . OSA (obstructive sleep apnea) 12/10/2015  . Left inguinal hernia 11/07/2015  . Anxiety 11/07/2015  . Unstable angina ([place]) 10/05/2015  . Back pain with left-sided radiculopathy 09/30/2015  . Nocturnal hypoxia 09/06/2015  . BPH (benign prostatic hyperplasia) 08/01/2015  . Chronic diastolic CHF (congestive heart failure) ([place])   . Angina pectoris ([place])   . Chest pain 07/11/2015  . COPD (chronic obstructive pulmonary disease) ([place]) 07/03/2015  . CAD (coronary artery disease) 06/26/2015  . HTN (hypertension) 06/26/2015  . 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[place]) 06/26/2015   . Achalasia 07/24/2014  . GERD (gastroesophageal reflux disease) 06/07/2014  . Former tobacco use 04/11/2013  . HLD (hyperlipidemia) 04/09/2013    Past Surgical History:  Procedure Laterality Date  . BYPASS GRAFT    . CARDIAC CATHETERIZATION N/A 07/12/2015   rocedure: Left Heart Cath and Cors/Grafts Angiography;  Surgeon: Belva Crome, MD;  Location: [place] CV LAB;  Service: Cardiovascular;  Laterality: N/A;  . CARDIAC CATHETERIZATION Right 10/07/2015   Procedure: Left Heart Cath and Cors/Grafts Angiography;  Surgeon: Dionisio David, MD;  Location: [place] CV LAB;  Service: Cardiovascular;  Laterality: Right;  . CARDIAC CATHETERIZATION N/A 04/06/2016   Procedure: Left Heart Cath and Coronary Angiography;  Surgeon: Yolonda Kida, MD;  Location: [place] CV LAB;  Service: Cardiovascular;  Laterality: N/A;  . CARDIAC CATHETERIZATION  04/06/2016   Procedure: Bypass Graft Angiography;  Surgeon: Yolonda Kida, MD;  Location: [place] CV LAB;  Service: Cardiovascular;;  . CARDIAC CATHETERIZATION N/A 11/02/2016   Procedure: Left Heart Cath and Cors/Grafts Angiography and possible PCI;  Surgeon: Yolonda Kida, MD;  Location: [place] CV LAB;  Service: Cardiovascular;  Laterality: N/A;  . CARDIAC CATHETERIZATION N/A 11/02/2016   Procedure: Coronary Stent Intervention;  Surgeon: Yolonda Kida, MD;  Location: [place] CV LAB;  Service: Cardiovascular;  Laterality: N/A;  . CHOLECYSTECTOMY    . CIRCUMCISION N/A 06/09/2019   Procedure: CIRCUMCISION ADULT;  Surgeon: Billey Co, MD;  Location: [facility] ORS;  Service: Urology;  Laterality: N/A;  . COLONOSCOPY WITH PROPOFOL N/A 04/01/2018   Procedure: COLONOSCOPY WITH PROPOFOL;  Surgeon: Manya Silvas, MD;  Location: [facility] ENDOSCOPY;  Service: Endoscopy;  Laterality: N/A;  . ESOPHAGEAL DILATION    . ESOPHAGOGASTRODUODENOSCOPY (EGD) WITH PROPOFOL N/A 04/01/2018   Procedure: ESOPHAGOGASTRODUODENOSCOPY (EGD)  WITH PROPOFOL;  Surgeon: Manya Silvas, MD;  Location: [facility] ENDOSCOPY;  Service: Endoscopy;  Laterality: N/A;  . LEFT HEART CATH AND CORS/GRAFTS ANGIOGRAPHY N/A 06/12/2019   Procedure: LEFT HEART CATH AND CORS/GRAFTS ANGIOGRAPHY;  Surgeon: Teodoro Spray, MD;  Location: [place] CV LAB;  Service: Cardiovascular;  Laterality: N/A;  . TONSILLECTOMY    . VASCULAR SURGERY      Prior to Admission medications   Medication Sig Start Date End Date Taking? Authorizing Provider  Albuterol Sulfate, sensor, 108 (90 Base) MCG/ACT AEPB Inhale 2 puffs into the lungs at bedtime. AND PRN    [provider]  allopurinol (ZYLOPRIM) 300 MG tablet Take 1 tablet (300 mg total) by mouth 2 (two) times daily. 10/05/18   Birdie Sons, MD  ALPRAZolam Duanne Moron) 1 MG tablet Take 1 tablet (1 mg total) by mouth 3 (three) times daily. 06/23/19   Birdie Sons, MD  amoxicillin-clavulanate (AUGMENTIN) 875-125 MG tablet Take 1 tablet by mouth  2 (two) times daily for 7 days. 08/26/19 09/02/19  Blake Divine, MD  aspirin 81 MG chewable tablet Chew 81 mg by mouth daily.    [provider]  atorvastatin (LIPITOR) 80 MG tablet TAKE ONE TABLET BY MOUTH AT BEDTIME. Patient taking differently: Take 80 mg by mouth at bedtime.  09/19/18   Birdie Sons, MD  Blood Glucose Monitoring Suppl (ACCU-CHEK AVIVA PLUS) w/Device KIT CHECK BLOOD SUGAR EVERY DAY 12/08/17   Birdie Sons, MD  BREO ELLIPTA 100-25 MCG/INH AEPB INHALE 1 PUFF INTO THE LUNGS DAILY 06/13/19   Kendell Bane, NP  cetirizine (ZYRTEC) 10 MG tablet TAKE ONE TABLET BY MOUTH AT BEDTIME 08/01/19   Birdie Sons, MD  Cholecalciferol (VITAMIN D3) 125 MCG (5000 UT) CAPS Take 5,000 Units by mouth daily.    [provider]  dapagliflozin propanediol (FARXIGA) 10 MG TABS tablet Take 10 mg by mouth daily. Patient taking differently: Take 10 mg by mouth every morning.  05/29/19   Birdie Sons, MD  docusate sodium (COLACE) 100 MG capsule  Take 100 mg by mouth daily.     [provider]  ELIQUIS 5 MG TABS tablet TAKE 1 TABLET BY MOUTH TWICE A DAY 08/10/19   Birdie Sons, MD  glucose blood (ACCU-CHEK AVIVA PLUS) test strip Use to check blood sugar once a day 02/14/18   Birdie Sons, MD  isosorbide mononitrate (IMDUR) 120 MG 24 hr tablet Take 1 tablet (120 mg total) by mouth daily. 08/10/19   Birdie Sons, MD  LINZESS 72 MCG capsule Take 72 mcg by mouth daily before breakfast.     [provider]  lisinopril (PRINIVIL,ZESTRIL) 2.5 MG tablet TAKE 1 TABLET BY MOUTH DAILY Patient taking differently: Take 2.5 mg by mouth every morning.  10/20/18   Birdie Sons, MD  magnesium oxide (MAG-OX) 400 (241.3 Mg) MG tablet Take 1 tablet (400 mg total) by mouth every morning. 06/13/19   Alisa Graff, FNP  methocarbamol (ROBAXIN) 500 MG tablet Take 1 tablet (500 mg total) by mouth 4 (four) times daily. 05/29/19   Birdie Sons, MD  nitroGLYCERIN (NITROSTAT) 0.4 MG SL tablet Place 1 tablet (0.4 mg total) under the tongue every 5 (five) minutes as needed for chest pain. Reported on 05/26/2016 11/28/18   Birdie Sons, MD  nystatin-triamcinolone (MYCOLOG II) cream Apply 1 application topically 2 (two) times daily. 06/29/19   Billey Co, MD  omega-3 acid ethyl esters (LOVAZA) 1 g capsule Take 4 capsules (4 g total) by mouth daily. 06/26/19   Birdie Sons, MD  omeprazole (PRILOSEC) 40 MG capsule Take 40 mg by mouth 2 (two) times a day.  04/24/19   [provider]  oxyCODONE-acetaminophen (PERCOCET) 10-325 MG tablet Take 1 tablet by mouth every 4 (four) hours. 08/18/19   Birdie Sons, MD  OXYGEN Inhale 2 L into the lungs continuous.     [provider]  ranolazine (RANEXA) 500 MG 12 hr tablet Take 1 tablet (500 mg total) by mouth 2 (two) times daily. 06/12/19   Dustin Flock, MD  tiotropium (SPIRIVA HANDIHALER) 18 MCG inhalation capsule Place 1 capsule (18 mcg total) into inhaler and inhale daily.  Patient taking differently: Place 18 mcg into inhaler and inhale every morning.  02/20/19   Birdie Sons, MD  torsemide (DEMADEX) 100 MG tablet Take 50 mg by mouth daily as needed (for fluid).  10/11/17   [provider]  traZODone (  DESYREL) 100 MG tablet Take 1 tablet (100 mg total) by mouth at bedtime. For sleep 07/20/19   Birdie Sons, MD  venlafaxine XR (EFFEXOR-XR) 75 MG 24 hr capsule Take 1 capsule (75 mg total) by mouth daily with breakfast. 04/04/19   Birdie Sons, MD    Allergies Demerol  [meperidine hcl], Demerol [meperidine], Jardiance [empagliflozin], Prednisone, Sulfa antibiotics, Albuterol sulfate [albuterol], and Morphine sulfate  Family History  Problem Relation Age of Onset  . Heart attack Mother   . Depression Mother   . Heart disease Mother   . COPD Mother   . Hypertension Mother   . Heart attack Father   . Diabetes Father   . Depression Father   . Heart disease Father   . Cirrhosis Father   . Parkinson's disease Brother     Social History Social History   Tobacco Use  . Smoking status: Former Smoker    Packs/day: 3.00    Years: 50.00    Pack years: 150.00    Types: Cigarettes    Quit date: 04/22/2013    Years since quitting: 6.3  . Smokeless tobacco: Never Used  Substance Use Topics  . Alcohol use: No    Comment: remotely quit alcohol use. Hx of heavy alcohol use.  . Drug use: No    Review of Systems  Constitutional: No fever/chills Eyes: No visual changes. ENT: No sore throat. Cardiovascular: Positive for chest pain. Respiratory: Positive for shortness of breath. Gastrointestinal: No abdominal pain.  No nausea, no vomiting.  No diarrhea.  No constipation. Genitourinary: Negative for dysuria. Musculoskeletal: Negative for back pain.  Positive for right arm pain. Skin: Positive for rash. Neurological: Negative for headaches, focal weakness or numbness.  ____________________________________________   PHYSICAL EXAM:  VITAL  SIGNS: ED Triage Vitals  Enc Vitals Group     BP 08/26/19 1528 (!) 168/73     Pulse Rate 08/26/19 1528 70     Resp 08/26/19 1528 18     Temp 08/26/19 1528 98.3 F (36.8 C)     Temp Source 08/26/19 1528 Oral     SpO2 08/26/19 1528 93 %     Weight 08/26/19 1533 244 lb (110.7 kg)     Height 08/26/19 1533 5' 7" (1.702 m)     Head Circumference --      Peak Flow --      Pain Score 08/26/19 1532 0     Pain Loc --      Pain Edu? --      Excl. in [place]? --     Constitutional: Alert and oriented. Eyes: Conjunctivae are normal. Head: Atraumatic. Nose: No congestion/rhinnorhea. Mouth/Throat: Mucous membranes are moist. Neck: Normal ROM Cardiovascular: Normal rate, regular rhythm. Grossly normal heart sounds. Respiratory: Normal respiratory effort.  No retractions. Lungs CTAB. Gastrointestinal: Soft and nontender. No distention. Genitourinary: deferred Musculoskeletal: No lower extremity tenderness nor edema.  Positive for erythema, warmth, and tenderness to dorsal right proximal forearm, no fluctuance or drainage noted. Neurologic:  Normal speech and language. No gross focal neurologic deficits are appreciated. Skin:  Skin is warm, dry and intact. No rash noted. Psychiatric: Mood and affect are normal. Speech and behavior are normal.  ____________________________________________   LABS (all labs ordered are listed, but only abnormal results are displayed)  Labs Reviewed  BASIC METABOLIC PANEL - Abnormal; Notable for the following components:      Result Value   Glucose, Bld 267 (*)    All other components within normal limits  CBC - Abnormal; Notable for the following components:   RBC 6.35 (*)    HCT 54.1 (*)    RDW 16.0 (*)    All other components within normal limits  TROPONIN I (HIGH SENSITIVITY)  TROPONIN I (HIGH SENSITIVITY)   ____________________________________________  EKG  ED ECG REPORT I, Blake Divine, the attending physician, personally viewed and interpreted  this ECG.   Date: 08/26/2019  EKG Time: 15:38  Rate: 71  Rhythm: normal sinus rhythm  Axis: Normal  Intervals:none  ST&T Change: Anterior Q waves, similar to prior    PROCEDURES  Procedure(s) performed (including Critical Care):  Procedures   ____________________________________________   INITIAL IMPRESSION / ASSESSMENT AND PLAN / ED COURSE       63 year old male with history of CAD status post CABG and A. fib on Eliquis presents to the ED complaining of right arm redness and pain along with fluttering and pain in his chest.  Area of redness to his right forearm appears consistent with cellulitis, no fluctuance or fluid collection to suggest abscess.  Will start on Augmentin given this appears to be the result of scratch from a dog.  His chest pain is atypical and while he describes fluttering in his chest, he remains in sinus rhythm here in the ED.  Initial troponin is negative, relatively low suspicion for ACS, however given his numerous risk factors, will discuss with cardiology.  He did have catheterization 3 months prior showing only 30 to 40% stenosis in prior vein graft, otherwise unremarkable with plan for medical management.  Chest x-ray negative for acute process.  Repeat troponin within normal limits.  Case discussed with Dr. Saralyn Pilar of cardiology, who agrees that given patient's catheterization without significant stenosis 2 months prior and 2 sets negative troponin here, he is appropriate for close outpatient follow-up with cardiology.  Will prescribe Augmentin for cellulitis and counseled patient to follow-up with cardiology, otherwise return to the ED for new or worsening symptoms.  Patient agrees with plan.      ____________________________________________   FINAL CLINICAL IMPRESSION(S) / ED DIAGNOSES  Final diagnoses:  Cellulitis of right upper extremity  Chest pain, unspecified type     ED Discharge Orders         Ordered    amoxicillin-clavulanate  (AUGMENTIN) 875-125 MG tablet  2 times daily     08/26/19 1922           Note:  This document was prepared using Dragon voice recognition software and may include unintentional dictation errors.   Blake Divine, MD 08/26/19 [phone]

## 2019-08-26 NOTE — ED Triage Notes (Signed)
Pt here for abscess to right forearm. Just distal of elbow is red swollen area that is warm to the touch. Pt also c/o fluttering/funny feeling in left chest that has been there for 3 days. No SHOB or cough. No fevers. VSS.

## 2019-08-26 NOTE — ED Notes (Signed)
PT reporting his pain has increased to a 10/10 and he is having numbness in his left arm. MD at bedside to assess patient.

## 2019-08-28 ENCOUNTER — Other Ambulatory Visit: Payer: Self-pay | Admitting: Family Medicine

## 2019-08-28 DIAGNOSIS — G4700 Insomnia, unspecified: Secondary | ICD-10-CM

## 2019-08-30 ENCOUNTER — Ambulatory Visit: Payer: Medicare HMO | Admitting: Urology

## 2019-08-31 ENCOUNTER — Ambulatory Visit: Payer: Medicare HMO | Admitting: Urology

## 2019-09-04 ENCOUNTER — Other Ambulatory Visit: Payer: Self-pay

## 2019-09-04 ENCOUNTER — Emergency Department: Payer: Medicare HMO

## 2019-09-04 ENCOUNTER — Emergency Department
Admission: EM | Admit: 2019-09-04 | Discharge: 2019-09-04 | Disposition: A | Discharge status: Home / Self Care | Payer: Medicare HMO | Attending: Emergency Medicine | Admitting: Emergency Medicine

## 2019-09-04 ENCOUNTER — Ambulatory Visit: Payer: Medicare HMO | Admitting: Urology

## 2019-09-04 DIAGNOSIS — E1122 Type 2 diabetes mellitus with diabetic chronic kidney disease: Secondary | ICD-10-CM | POA: Insufficient documentation

## 2019-09-04 DIAGNOSIS — Z20828 Contact with and (suspected) exposure to other viral communicable diseases: Secondary | ICD-10-CM | POA: Diagnosis not present

## 2019-09-04 DIAGNOSIS — I5032 Chronic diastolic (congestive) heart failure: Secondary | ICD-10-CM | POA: Insufficient documentation

## 2019-09-04 DIAGNOSIS — J45909 Unspecified asthma, uncomplicated: Secondary | ICD-10-CM | POA: Insufficient documentation

## 2019-09-04 DIAGNOSIS — R079 Chest pain, unspecified: Secondary | ICD-10-CM | POA: Insufficient documentation

## 2019-09-04 DIAGNOSIS — Z87891 Personal history of nicotine dependence: Secondary | ICD-10-CM | POA: Insufficient documentation

## 2019-09-04 DIAGNOSIS — N182 Chronic kidney disease, stage 2 (mild): Secondary | ICD-10-CM | POA: Diagnosis not present

## 2019-09-04 DIAGNOSIS — I700 Atherosclerosis of aorta: Secondary | ICD-10-CM | POA: Diagnosis not present

## 2019-09-04 DIAGNOSIS — R05 Cough: Secondary | ICD-10-CM | POA: Diagnosis not present

## 2019-09-04 DIAGNOSIS — R0789 Other chest pain: Secondary | ICD-10-CM | POA: Diagnosis not present

## 2019-09-04 DIAGNOSIS — Z7901 Long term (current) use of anticoagulants: Secondary | ICD-10-CM | POA: Insufficient documentation

## 2019-09-04 DIAGNOSIS — Z79899 Other long term (current) drug therapy: Secondary | ICD-10-CM | POA: Insufficient documentation

## 2019-09-04 DIAGNOSIS — K5730 Diverticulosis of large intestine without perforation or abscess without bleeding: Secondary | ICD-10-CM | POA: Diagnosis not present

## 2019-09-04 DIAGNOSIS — I130 Hypertensive heart and chronic kidney disease with heart failure and stage 1 through stage 4 chronic kidney disease, or unspecified chronic kidney disease: Secondary | ICD-10-CM | POA: Insufficient documentation

## 2019-09-04 DIAGNOSIS — J449 Chronic obstructive pulmonary disease, unspecified: Secondary | ICD-10-CM | POA: Diagnosis not present

## 2019-09-04 DIAGNOSIS — I2510 Atherosclerotic heart disease of native coronary artery without angina pectoris: Secondary | ICD-10-CM | POA: Insufficient documentation

## 2019-09-04 DIAGNOSIS — K4090 Unilateral inguinal hernia, without obstruction or gangrene, not specified as recurrent: Secondary | ICD-10-CM | POA: Diagnosis not present

## 2019-09-04 DIAGNOSIS — Z7982 Long term (current) use of aspirin: Secondary | ICD-10-CM | POA: Insufficient documentation

## 2019-09-04 DIAGNOSIS — R0602 Shortness of breath: Secondary | ICD-10-CM | POA: Diagnosis not present

## 2019-09-04 LAB — CBC WITH DIFFERENTIAL/PLATELET
Abs Immature Granulocytes: 0.03 10*3/uL (ref 0.00–0.07)
Basophils Absolute: 0.1 10*3/uL (ref 0.0–0.1)
Basophils Relative: 1 %
Eosinophils Absolute: 0.3 10*3/uL (ref 0.0–0.5)
Eosinophils Relative: 4 %
HCT: 54.8 % — ABNORMAL HIGH (ref 39.0–52.0)
Hemoglobin: 17.2 g/dL — ABNORMAL HIGH (ref 13.0–17.0)
Immature Granulocytes: 0 %
Lymphocytes Relative: 32 %
Lymphs Abs: 2.4 10*3/uL (ref 0.7–4.0)
MCH: 26.5 pg (ref 26.0–34.0)
MCHC: 31.4 g/dL (ref 30.0–36.0)
MCV: 84.4 fL (ref 80.0–100.0)
Monocytes Absolute: 0.7 10*3/uL (ref 0.1–1.0)
Monocytes Relative: 9 %
Neutro Abs: 4 10*3/uL (ref 1.7–7.7)
Neutrophils Relative %: 54 %
Platelets: 189 10*3/uL (ref 150–400)
RBC: 6.49 MIL/uL — ABNORMAL HIGH (ref 4.22–5.81)
RDW: 16.6 % — ABNORMAL HIGH (ref 11.5–15.5)
WBC: 7.3 10*3/uL (ref 4.0–10.5)
nRBC: 0 % (ref 0.0–0.2)

## 2019-09-04 LAB — COMPREHENSIVE METABOLIC PANEL
ALT: 18 U/L (ref 0–44)
AST: 22 U/L (ref 15–41)
Albumin: 4.1 g/dL (ref 3.5–5.0)
Alkaline Phosphatase: 89 U/L (ref 38–126)
Anion gap: 10 (ref 5–15)
BUN: 13 mg/dL (ref 8–23)
CO2: 28 mmol/L (ref 22–32)
Calcium: 9.2 mg/dL (ref 8.9–10.3)
Chloride: 101 mmol/L (ref 98–111)
Creatinine, Ser: 0.89 mg/dL (ref 0.61–1.24)
GFR calc Af Amer: >60 mL/min (ref >60)
GFR calc non Af Amer: >60 mL/min (ref >60)
Glucose, Bld: 231 mg/dL — ABNORMAL HIGH (ref 70–99)
Potassium: 3.7 mmol/L (ref 3.5–5.1)
Sodium: 139 mmol/L (ref 135–145)
Total Bilirubin: 1 mg/dL (ref 0.3–1.2)
Total Protein: 7.1 g/dL (ref 6.5–8.1)

## 2019-09-04 LAB — TROPONIN I (HIGH SENSITIVITY)
Troponin I (High Sensitivity): 16 ng/L (ref <18)
Troponin I (High Sensitivity): 16 ng/L (ref <18)

## 2019-09-04 LAB — LIPASE, BLOOD: Lipase: 56 U/L — ABNORMAL HIGH (ref 11–51)

## 2019-09-04 MED ORDER — ALUM & MAG HYDROXIDE-SIMETH 200-200-20 MG/5ML PO SUSP
30.0000 mL | Freq: Once | ORAL | Status: AC
  Administered 2019-09-04: 30 mL via ORAL
  Filled 2019-09-04: qty 30

## 2019-09-04 MED ORDER — IOHEXOL 350 MG/ML SOLN
100.0000 mL | Freq: Once | INTRAVENOUS | Status: AC | PRN
  Administered 2019-09-04: 100 mL via INTRAVENOUS

## 2019-09-04 MED ORDER — MORPHINE SULFATE (PF) 4 MG/ML IV SOLN
4.0000 mg | Freq: Once | INTRAVENOUS | Status: AC
  Administered 2019-09-04: 4 mg via INTRAVENOUS

## 2019-09-04 MED ORDER — HYDROMORPHONE HCL 1 MG/ML IJ SOLN
1.0000 mg | Freq: Once | INTRAMUSCULAR | Status: AC
  Administered 2019-09-04: 1 mg via INTRAVENOUS
  Filled 2019-09-04: qty 1

## 2019-09-04 MED ORDER — LIDOCAINE VISCOUS HCL 2 % MT SOLN
15.0000 mL | Freq: Once | OROMUCOSAL | Status: AC
  Administered 2019-09-04: 15 mL via ORAL
  Filled 2019-09-04: qty 15

## 2019-09-04 MED ORDER — DILTIAZEM HCL 25 MG/5ML IV SOLN
20.0000 mg | Freq: Once | INTRAVENOUS | Status: AC
  Administered 2019-09-04: 02:00:00 20 mg via INTRAVENOUS
  Filled 2019-09-04: qty 5

## 2019-09-04 MED ORDER — MORPHINE SULFATE (PF) 4 MG/ML IV SOLN
4.0000 mg | Freq: Once | INTRAVENOUS | Status: AC
  Administered 2019-09-04: 4 mg via INTRAVENOUS
  Filled 2019-09-04: qty 1

## 2019-09-04 MED ORDER — MORPHINE SULFATE (PF) 4 MG/ML IV SOLN
INTRAVENOUS | Status: AC
  Filled 2019-09-04: qty 1

## 2019-09-04 NOTE — ED Provider Notes (Signed)
[facility] Emergency Department Provider Note   ____________________________________________   I have reviewed the triage vital signs and the nursing notes.   HISTORY  Chief Complaint Chest Pain   History limited by: Not Limited   HPI Lawrence [name] is a 63 y.o. male who presents to the emergency department today because of concern for chest pain. The patient states that he woke up from sleep because of the pain. Located in his central chest. The patient says that he felt like he was back in his afib. He does go in and out of it. He checked his heart rate at home and noticed it was elevated. The patient is on eliquis.   Records reviewed. Per medical record review patient has a history of paroxysmal atrial fibrillation.   Past Medical History:  Diagnosis Date  . A-fib ([place])   . Anemia   . Anginal pain ([place])   . Anxiety   . Arthritis   . Asthma   . CAD (coronary artery disease)    a. 2002 CABGx2 (LIMA->LAD, VG->VG->OM1);  b. 09/2012 DES->OM;  c. 03/2015 PTCA of LAD [facility]) in setting of atretic LIMA; d. 05/2015 Cath [facility]): nonobs dzs; e. 06/2015 Cath ([facility]): LM nl, LAD 45p/d ISR, 50d, D1/2 small, LCX 50p/d ISR, OM1 70ost, 30 ISR, VG->OM1 50ost, 31m LIMA->LAD 99p/d - atretic, RCA dom, nl; f.cath 10/16: 40-50%(FFR 0.90) pLAD, 75% (FFR 0.77) mLAD s/p PCI/DES, oRCA 40% (FFR0.95)  . Cancer ([place]    SKIN CANCER ON BACK  . Celiac disease   . Chronic diastolic CHF (congestive heart failure) ([place]    a. 06/2009 Echo: EF 60-65%, Gr 1 DD, triv AI, mildly dil LA, nl RV.  .Marland KitchenCOPD (chronic obstructive pulmonary disease) ([place]    a. Chronic bronchitis and emphysema.  . DDD (degenerative disc disease), lumbar   . Diverticulosis   . Dysrhythmia   . Essential hypertension   . GERD (gastroesophageal reflux disease)   . History of hiatal hernia   . History of kidney stones    H/O  . History of tobacco abuse    a. Quit 2014.  .Marland KitchenMyocardial infarction ([place] 2002   4 STENTS   . Pancreatitis   . PSVT (paroxysmal supraventricular tachycardia) ([place]    a. 10/2012 Noted on Zio Patch.  . Sleep apnea    LOST CORD TO CPAP -ONLY 02 @ BEDTIME  . Tubular adenoma of colon   . Type II diabetes mellitus ([facility]     Patient Active Problem List   Diagnosis Date Noted  . Chronic respiratory failure with hypoxia ([place] 05/29/2019  . Morbid obesity (HFort [place] 05/29/2019  . Trigger thumb of right hand 11/28/2018  . Eye pain, left 08/18/2018  . Acute on chronic heart failure (HB[place] 04/18/2018  . History of adenomatous polyp of colon 04/05/2018  . Abdominal pain, chronic, epigastric 11/06/2017  . Bilateral flank pain 03/24/2017  . Dyspnea 04/03/2016  . Hypotension 04/03/2016  . CKD (chronic kidney disease) stage 2, GFR 60-89 ml/min 04/03/2016  . Anemia 04/03/2016  . Paroxysmal atrial fibrillation ([place] 12/23/2015  . OSA (obstructive sleep apnea) 12/10/2015  . Left inguinal hernia 11/07/2015  . Anxiety 11/07/2015  . Unstable angina ([place] 10/05/2015  . Back pain with left-sided radiculopathy 09/30/2015  . Nocturnal hypoxia 09/06/2015  . BPH (benign prostatic hyperplasia) 08/01/2015  . Chronic diastolic CHF (congestive heart failure) (HM[place]   . Angina pectoris (HW[place]   . Chest pain 07/11/2015  . COPD (chronic obstructive pulmonary disease) (  [place]) 07/03/2015  . CAD (coronary artery disease) 06/26/2015  . HTN (hypertension) 06/26/2015  . 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[place]) 06/26/2015  . Achalasia 07/24/2014  . GERD (gastroesophageal reflux disease) 06/07/2014  . Former tobacco use 04/11/2013  . HLD (hyperlipidemia) 04/09/2013    Past Surgical History:  Procedure Laterality Date  . BYPASS GRAFT    . CARDIAC CATHETERIZATION N/A 07/12/2015   rocedure: Left Heart Cath and Cors/Grafts Angiography;  Surgeon: Belva Crome, MD;  Location: [place] [facility] LAB;  Service: Cardiovascular;  Laterality: N/A;  . CARDIAC CATHETERIZATION Right 10/07/2015   Procedure: Left Heart  Cath and Cors/Grafts Angiography;  Surgeon: Dionisio David, MD;  Location: [place] CV LAB;  Service: Cardiovascular;  Laterality: Right;  . CARDIAC CATHETERIZATION N/A 04/06/2016   Procedure: Left Heart Cath and Coronary Angiography;  Surgeon: Yolonda Kida, MD;  Location: [place] CV LAB;  Service: Cardiovascular;  Laterality: N/A;  . CARDIAC CATHETERIZATION  04/06/2016   Procedure: Bypass Graft Angiography;  Surgeon: Yolonda Kida, MD;  Location: [place] CV LAB;  Service: Cardiovascular;;  . CARDIAC CATHETERIZATION N/A 11/02/2016   Procedure: Left Heart Cath and Cors/Grafts Angiography and possible PCI;  Surgeon: Yolonda Kida, MD;  Location: [place] CV LAB;  Service: Cardiovascular;  Laterality: N/A;  . CARDIAC CATHETERIZATION N/A 11/02/2016   Procedure: Coronary Stent Intervention;  Surgeon: Yolonda Kida, MD;  Location: [place] [facility] LAB;  Service: Cardiovascular;  Laterality: N/A;  . CHOLECYSTECTOMY    . CIRCUMCISION N/A 06/09/2019   Procedure: CIRCUMCISION ADULT;  Surgeon: Billey Co, MD;  Location: [facility] ORS;  Service: Urology;  Laterality: N/A;  . COLONOSCOPY WITH PROPOFOL N/A 04/01/2018   Procedure: COLONOSCOPY WITH PROPOFOL;  Surgeon: Manya Silvas, MD;  Location: [facility] ENDOSCOPY;  Service: Endoscopy;  Laterality: N/A;  . ESOPHAGEAL DILATION    . ESOPHAGOGASTRODUODENOSCOPY (EGD) WITH PROPOFOL N/A 04/01/2018   Procedure: ESOPHAGOGASTRODUODENOSCOPY (EGD) WITH PROPOFOL;  Surgeon: Manya Silvas, MD;  [facility];  Service: Endoscopy;  Laterality: N/A;  . LEFT HEART CATH AND CORS/GRAFTS ANGIOGRAPHY N/A 06/12/2019   Procedure: LEFT HEART CATH AND CORS/GRAFTS ANGIOGRAPHY;  Surgeon: Teodoro Spray, MD;  Location: [place] CV LAB;  Service: Cardiovascular;  Laterality: N/A;  . TONSILLECTOMY    . VASCULAR SURGERY      Prior to Admission medications   Medication Sig Start Date End Date Taking? Authorizing Provider  Albuterol  Sulfate, sensor, 108 (90 Base) MCG/ACT AEPB Inhale 2 puffs into the lungs at bedtime. AND PRN    [provider]  allopurinol (ZYLOPRIM) 300 MG tablet Take 1 tablet (300 mg total) by mouth 2 (two) times daily. 10/05/18   Birdie Sons, MD  ALPRAZolam Duanne Moron) 1 MG tablet Take 1 tablet (1 mg total) by mouth 3 (three) times daily. 06/23/19   Birdie Sons, MD  aspirin 81 MG chewable tablet Chew 81 mg by mouth daily.    [provider]  atorvastatin (LIPITOR) 80 MG tablet TAKE ONE TABLET BY MOUTH AT BEDTIME. Patient taking differently: Take 80 mg by mouth at bedtime.  09/19/18   Birdie Sons, MD  Blood Glucose Monitoring Suppl (ACCU-CHEK AVIVA PLUS) w/Device KIT CHECK BLOOD SUGAR EVERY DAY 12/08/17   Birdie Sons, MD  BREO ELLIPTA 100-25 MCG/INH AEPB INHALE 1 PUFF INTO THE LUNGS DAILY 06/13/19   Kendell Bane, NP  cetirizine (ZYRTEC) 10 MG tablet TAKE ONE TABLET BY MOUTH AT BEDTIME 08/01/19  Birdie Sons, MD  Cholecalciferol (VITAMIN D3) 125 MCG (5000 UT) CAPS Take 5,000 Units by mouth daily.    [provider]  dapagliflozin propanediol (FARXIGA) 10 MG TABS tablet Take 10 mg by mouth daily. Patient taking differently: Take 10 mg by mouth every morning.  05/29/19   Birdie Sons, MD  docusate sodium (COLACE) 100 MG capsule Take 100 mg by mouth daily.     [provider]  ELIQUIS 5 MG TABS tablet TAKE 1 TABLET BY MOUTH TWICE A DAY 08/10/19   Birdie Sons, MD  glucose blood (ACCU-CHEK AVIVA PLUS) test strip Use to check blood sugar once a day 02/14/18   Birdie Sons, MD  isosorbide mononitrate (IMDUR) 120 MG 24 hr tablet Take 1 tablet (120 mg total) by mouth daily. 08/10/19   Birdie Sons, MD  LINZESS 72 MCG capsule Take 72 mcg by mouth daily before breakfast.     [provider]  lisinopril (PRINIVIL,ZESTRIL) 2.5 MG tablet TAKE 1 TABLET BY MOUTH DAILY Patient taking differently: Take 2.5 mg by mouth every morning.  10/20/18   Birdie Sons, MD  magnesium oxide (MAG-OX) 400 (241.3 Mg) MG tablet Take 1 tablet (400 mg total) by mouth every morning. 06/13/19   Alisa Graff, FNP  methocarbamol (ROBAXIN) 500 MG tablet Take 1 tablet (500 mg total) by mouth 4 (four) times daily. 05/29/19   Birdie Sons, MD  nitroGLYCERIN (NITROSTAT) 0.4 MG SL tablet Place 1 tablet (0.4 mg total) under the tongue every 5 (five) minutes as needed for chest pain. Reported on 05/26/2016 11/28/18   Birdie Sons, MD  nystatin-triamcinolone (MYCOLOG II) cream Apply 1 application topically 2 (two) times daily. 06/29/19   Billey Co, MD  omega-3 acid ethyl esters (LOVAZA) 1 g capsule Take 4 capsules (4 g total) by mouth daily. 06/26/19   Birdie Sons, MD  omeprazole (PRILOSEC) 40 MG capsule Take 40 mg by mouth 2 (two) times a day.  04/24/19   [provider]  oxyCODONE-acetaminophen (PERCOCET) 10-325 MG tablet Take 1 tablet by mouth every 4 (four) hours. 08/18/19   Birdie Sons, MD  OXYGEN Inhale 2 L into the lungs continuous.     [provider]  ranolazine (RANEXA) 500 MG 12 hr tablet Take 1 tablet (500 mg total) by mouth 2 (two) times daily. 06/12/19   Dustin Flock, MD  tiotropium (SPIRIVA HANDIHALER) 18 MCG inhalation capsule Place 1 capsule (18 mcg total) into inhaler and inhale daily. Patient taking differently: Place 18 mcg into inhaler and inhale every morning.  02/20/19   Birdie Sons, MD  torsemide (DEMADEX) 100 MG tablet Take 50 mg by mouth daily as needed (for fluid).  10/11/17   [provider]  traZODone (DESYREL) 100 MG tablet TAKE 1 TABLET BY MOUTH AT BEDTIME FOR SLEEP 08/28/19   Birdie Sons, MD  venlafaxine XR (EFFEXOR-XR) 75 MG 24 hr capsule Take 1 capsule (75 mg total) by mouth daily with breakfast. 04/04/19   Birdie Sons, MD    Allergies Demerol  [meperidine hcl], Demerol [meperidine], Jardiance [empagliflozin], Prednisone, Sulfa antibiotics, Albuterol sulfate [albuterol], and  Morphine sulfate  Family History  Problem Relation Age of Onset  . Heart attack Mother   . Depression Mother   . Heart disease Mother   . COPD Mother   . Hypertension Mother   . Heart attack Father   . Diabetes Father   . Depression Father   .  Heart disease Father   . Cirrhosis Father   . Parkinson's disease Brother     Social History Social History   Tobacco Use  . Smoking status: Former Smoker    Packs/day: 3.00    Years: 50.00    Pack years: 150.00    Types: Cigarettes    Quit date: 04/22/2013    Years since quitting: 6.3  . Smokeless tobacco: Never Used  Substance Use Topics  . Alcohol use: No    Comment: remotely quit alcohol use. Hx of heavy alcohol use.  . Drug use: No    Review of Systems Constitutional: No fever/chills Eyes: No visual changes. ENT: No sore throat. Cardiovascular: Positive for chest pain and fast heart rate.  Respiratory: Denies shortness of breath. Gastrointestinal: No abdominal pain.  No nausea, no vomiting.  No diarrhea.   Genitourinary: Negative for dysuria. Musculoskeletal: Negative for back pain. Skin: Negative for rash. Neurological: Negative for headaches, focal weakness or numbness.  ____________________________________________   PHYSICAL EXAM:  VITAL SIGNS: ED Triage Vitals  Enc Vitals Group     BP 09/04/19 0056 (!) 156/97     Pulse Rate 09/04/19 0056 (!) 118     Resp 09/04/19 0056 19     Temp 09/04/19 0056 97.9 F (36.6 C)     Temp Source 09/04/19 0056 Oral     SpO2 09/04/19 0056 93 %     Weight 09/04/19 0049 245 lb (111.1 kg)     Height 09/04/19 0049 5' 7"  (1.702 m)     Head Circumference --      Peak Flow --      Pain Score 09/04/19 0049 9     Pain Loc --      Pain Edu? --      Excl. in [place]? --      Constitutional: Alert and oriented.  Eyes: Conjunctivae are normal.  ENT      Head: Normocephalic and atraumatic.      Nose: No congestion/rhinnorhea.      Mouth/Throat: Mucous membranes are moist.      Neck:  No stridor. Hematological/Lymphatic/Immunilogical: No cervical lymphadenopathy. Cardiovascular: Tachycardia, irregularly irregular rhythm.  No murmurs, rubs, or gallops. Respiratory: Normal respiratory effort without tachypnea nor retractions. Breath sounds are clear and equal bilaterally. No wheezes/rales/rhonchi. Gastrointestinal: Soft and non tender. No rebound. No guarding.  Genitourinary: Deferred Musculoskeletal: Normal range of motion in all extremities. No lower extremity edema. Neurologic:  Normal speech and language. No gross focal neurologic deficits are appreciated.  Skin:  Skin is warm, dry and intact. No rash noted. Psychiatric: Mood and affect are normal. Speech and behavior are normal. Patient exhibits appropriate insight and judgment.  ____________________________________________    LABS (pertinent positives/negatives)  Trop hs 16 CMP wnl except glu 231 CBC wbc 7.3, hgb 17.2, plt 189 Lipase 56 ____________________________________________   EKG  I, Nance Pear, attending physician, personally viewed and interpreted this EKG  EKG Time: 0042 Rate: 129 Rhythm: atrial fibrillation with RVR Axis: normal Intervals: qtc 483 QRS: narrow, q waves v1, v2, v3 ST changes: no st elevation Impression: abnormal ekg  ____________________________________________    RADIOLOGY  None  ____________________________________________   PROCEDURES  Procedures  ____________________________________________   INITIAL IMPRESSION / ASSESSMENT AND PLAN / ED COURSE  Pertinent labs & imaging results that were available during my care of the patient were reviewed by me and considered in my medical decision making (see chart for details).   Patient presented to the emergency department today because  of concerns for chest pain irregular heart rhythm.  Patient has history of chronic chest pain issues.  Has paroxysmal A. fib.  Patient was in A. fib with RVR when he initially  came.  It did respond well to Cardizem.  Patient had 2 - troponins.  Lipase was negative his patient was also concerned for possible pancreatitis.  At this time I doubt ACS. Doubt PE given lack of SOB. Given continued pain will get CT to evaluate for dissection.   ____________________________________________   FINAL CLINICAL IMPRESSION(S) / ED DIAGNOSES  Final diagnoses:  Nonspecific chest pain     Note: This dictation was prepared with Dragon dictation. Any transcriptional errors that result from this process are unintentional     Nance Pear, MD 09/04/19 [phone]

## 2019-09-04 NOTE — ED Provider Notes (Signed)
-----------------------------------------   9:02 AM on 09/04/2019 -----------------------------------------  Patient care assumed from Dr. Archie Balboa.  Patient's work-up overall reassuring.  Slight lipase elevation possibly representing mild pancreatitis.  Remainder of the work-up including 2 troponins are negative.  CT scan is negative for acute abnormality.  IMPRESSION:  Negative for acute aortic dissection or other acute vascular finding  in the chest abdomen or pelvis.   No acute intrathoracic, abdominal, or pelvic finding by CT   Remote cholecystectomy   Incidental duodenal diverticulum   Colonic diverticulosis without acute inflammatory process   Small fat containing left inguinal hernia   Aortic Atherosclerosis (ICD10-I70.0).    Patient will be discharged home with PCP follow-up.     Harvest Dark, MD 09/04/19 [phone]

## 2019-09-04 NOTE — ED Triage Notes (Signed)
Patient reports woke from sleep with chest pain around midnight.  Took blood pressure at home and it was elevated.

## 2019-09-04 NOTE — ED Notes (Signed)
Given cell phone per request and ice chips.

## 2019-09-04 NOTE — ED Notes (Signed)
Pt transferred to room 33 by this RN, pending pick up by his son due to pt receiving IV pain medication and being oxygen dependent and presenting to ED without his home O2. Pt arrives to room A&O x 4, on 2L via [place] at this time. Pt noted to be in A-fib on the monitor. VSS. Pt provided with remote and urinal. Lights dimmed for comfort. Will continue to monitor.

## 2019-09-04 NOTE — Discharge Instructions (Addendum)
Please seek medical attention for any high fevers, chest pain, shortness of breath, change in behavior, persistent vomiting, bloody stool or any other new or concerning symptoms.  

## 2019-09-04 NOTE — ED Notes (Signed)
Pt states son is on the way to get him and drive him home. Pt states that he frequently rides without oxygen and wants to leave with his son to get back to his home even though son does not have his portable oxygen with him. Pt states that he has done this before and wants to do it now so he can go home.   Pt in NAD, RR even and unlabored, color WNL. Will keep him on oxygen until son arrives.

## 2019-09-05 DIAGNOSIS — F419 Anxiety disorder, unspecified: Secondary | ICD-10-CM | POA: Diagnosis not present

## 2019-09-05 DIAGNOSIS — I480 Paroxysmal atrial fibrillation: Secondary | ICD-10-CM | POA: Diagnosis not present

## 2019-09-05 DIAGNOSIS — E119 Type 2 diabetes mellitus without complications: Secondary | ICD-10-CM | POA: Diagnosis not present

## 2019-09-05 DIAGNOSIS — K5909 Other constipation: Secondary | ICD-10-CM | POA: Diagnosis not present

## 2019-09-05 DIAGNOSIS — R0602 Shortness of breath: Secondary | ICD-10-CM | POA: Diagnosis not present

## 2019-09-05 DIAGNOSIS — I5032 Chronic diastolic (congestive) heart failure: Secondary | ICD-10-CM | POA: Diagnosis not present

## 2019-09-05 DIAGNOSIS — I110 Hypertensive heart disease with heart failure: Secondary | ICD-10-CM | POA: Diagnosis not present

## 2019-09-05 DIAGNOSIS — R079 Chest pain, unspecified: Secondary | ICD-10-CM | POA: Diagnosis not present

## 2019-09-05 DIAGNOSIS — R0789 Other chest pain: Secondary | ICD-10-CM | POA: Diagnosis not present

## 2019-09-05 DIAGNOSIS — M79602 Pain in left arm: Secondary | ICD-10-CM | POA: Diagnosis not present

## 2019-09-05 DIAGNOSIS — R05 Cough: Secondary | ICD-10-CM | POA: Diagnosis not present

## 2019-09-05 DIAGNOSIS — Z20828 Contact with and (suspected) exposure to other viral communicable diseases: Secondary | ICD-10-CM | POA: Diagnosis not present

## 2019-09-05 DIAGNOSIS — J811 Chronic pulmonary edema: Secondary | ICD-10-CM | POA: Diagnosis not present

## 2019-09-05 DIAGNOSIS — K5790 Diverticulosis of intestine, part unspecified, without perforation or abscess without bleeding: Secondary | ICD-10-CM | POA: Diagnosis not present

## 2019-09-05 DIAGNOSIS — E785 Hyperlipidemia, unspecified: Secondary | ICD-10-CM | POA: Diagnosis not present

## 2019-09-06 DIAGNOSIS — J449 Chronic obstructive pulmonary disease, unspecified: Secondary | ICD-10-CM | POA: Diagnosis not present

## 2019-09-06 DIAGNOSIS — E119 Type 2 diabetes mellitus without complications: Secondary | ICD-10-CM | POA: Diagnosis not present

## 2019-09-06 DIAGNOSIS — K219 Gastro-esophageal reflux disease without esophagitis: Secondary | ICD-10-CM | POA: Diagnosis not present

## 2019-09-06 DIAGNOSIS — R079 Chest pain, unspecified: Secondary | ICD-10-CM | POA: Diagnosis not present

## 2019-09-06 DIAGNOSIS — Z7984 Long term (current) use of oral hypoglycemic drugs: Secondary | ICD-10-CM | POA: Diagnosis not present

## 2019-09-07 DIAGNOSIS — Z7984 Long term (current) use of oral hypoglycemic drugs: Secondary | ICD-10-CM | POA: Diagnosis not present

## 2019-09-07 DIAGNOSIS — I480 Paroxysmal atrial fibrillation: Secondary | ICD-10-CM | POA: Diagnosis not present

## 2019-09-07 DIAGNOSIS — E872 Acidosis: Secondary | ICD-10-CM | POA: Diagnosis not present

## 2019-09-07 DIAGNOSIS — E119 Type 2 diabetes mellitus without complications: Secondary | ICD-10-CM | POA: Diagnosis not present

## 2019-09-07 DIAGNOSIS — R079 Chest pain, unspecified: Secondary | ICD-10-CM | POA: Diagnosis not present

## 2019-09-08 ENCOUNTER — Other Ambulatory Visit: Payer: Self-pay

## 2019-09-08 ENCOUNTER — Emergency Department: Payer: Medicare HMO

## 2019-09-08 ENCOUNTER — Observation Stay
Admission: EM | Admit: 2019-09-08 | Discharge: 2019-09-10 | Disposition: A | Discharge status: Home / Self Care | Payer: Medicare HMO | Attending: Internal Medicine | Admitting: Internal Medicine

## 2019-09-08 DIAGNOSIS — I5032 Chronic diastolic (congestive) heart failure: Secondary | ICD-10-CM | POA: Diagnosis not present

## 2019-09-08 DIAGNOSIS — E1122 Type 2 diabetes mellitus with diabetic chronic kidney disease: Secondary | ICD-10-CM | POA: Diagnosis not present

## 2019-09-08 DIAGNOSIS — J45909 Unspecified asthma, uncomplicated: Secondary | ICD-10-CM | POA: Insufficient documentation

## 2019-09-08 DIAGNOSIS — M1990 Unspecified osteoarthritis, unspecified site: Secondary | ICD-10-CM | POA: Insufficient documentation

## 2019-09-08 DIAGNOSIS — R040 Epistaxis: Secondary | ICD-10-CM | POA: Insufficient documentation

## 2019-09-08 DIAGNOSIS — I252 Old myocardial infarction: Secondary | ICD-10-CM | POA: Diagnosis not present

## 2019-09-08 DIAGNOSIS — Z87891 Personal history of nicotine dependence: Secondary | ICD-10-CM | POA: Insufficient documentation

## 2019-09-08 DIAGNOSIS — G4733 Obstructive sleep apnea (adult) (pediatric): Secondary | ICD-10-CM | POA: Diagnosis not present

## 2019-09-08 DIAGNOSIS — E785 Hyperlipidemia, unspecified: Secondary | ICD-10-CM | POA: Diagnosis not present

## 2019-09-08 DIAGNOSIS — Z794 Long term (current) use of insulin: Secondary | ICD-10-CM | POA: Insufficient documentation

## 2019-09-08 DIAGNOSIS — F419 Anxiety disorder, unspecified: Secondary | ICD-10-CM | POA: Insufficient documentation

## 2019-09-08 DIAGNOSIS — K219 Gastro-esophageal reflux disease without esophagitis: Secondary | ICD-10-CM | POA: Insufficient documentation

## 2019-09-08 DIAGNOSIS — I2510 Atherosclerotic heart disease of native coronary artery without angina pectoris: Secondary | ICD-10-CM | POA: Insufficient documentation

## 2019-09-08 DIAGNOSIS — Z86010 Personal history of colonic polyps: Secondary | ICD-10-CM | POA: Insufficient documentation

## 2019-09-08 DIAGNOSIS — Z7901 Long term (current) use of anticoagulants: Secondary | ICD-10-CM | POA: Insufficient documentation

## 2019-09-08 DIAGNOSIS — Z20828 Contact with and (suspected) exposure to other viral communicable diseases: Secondary | ICD-10-CM | POA: Insufficient documentation

## 2019-09-08 DIAGNOSIS — J449 Chronic obstructive pulmonary disease, unspecified: Secondary | ICD-10-CM | POA: Diagnosis not present

## 2019-09-08 DIAGNOSIS — R079 Chest pain, unspecified: Secondary | ICD-10-CM | POA: Diagnosis not present

## 2019-09-08 DIAGNOSIS — I480 Paroxysmal atrial fibrillation: Secondary | ICD-10-CM | POA: Insufficient documentation

## 2019-09-08 DIAGNOSIS — Z79899 Other long term (current) drug therapy: Secondary | ICD-10-CM | POA: Diagnosis not present

## 2019-09-08 DIAGNOSIS — I130 Hypertensive heart and chronic kidney disease with heart failure and stage 1 through stage 4 chronic kidney disease, or unspecified chronic kidney disease: Secondary | ICD-10-CM | POA: Insufficient documentation

## 2019-09-08 DIAGNOSIS — R9431 Abnormal electrocardiogram [ECG] [EKG]: Secondary | ICD-10-CM | POA: Insufficient documentation

## 2019-09-08 DIAGNOSIS — R58 Hemorrhage, not elsewhere classified: Secondary | ICD-10-CM | POA: Diagnosis not present

## 2019-09-08 DIAGNOSIS — N400 Enlarged prostate without lower urinary tract symptoms: Secondary | ICD-10-CM | POA: Insufficient documentation

## 2019-09-08 DIAGNOSIS — M109 Gout, unspecified: Secondary | ICD-10-CM | POA: Insufficient documentation

## 2019-09-08 DIAGNOSIS — F329 Major depressive disorder, single episode, unspecified: Secondary | ICD-10-CM | POA: Insufficient documentation

## 2019-09-08 DIAGNOSIS — Z85828 Personal history of other malignant neoplasm of skin: Secondary | ICD-10-CM | POA: Diagnosis not present

## 2019-09-08 DIAGNOSIS — Z634 Disappearance and death of family member: Secondary | ICD-10-CM

## 2019-09-08 DIAGNOSIS — N182 Chronic kidney disease, stage 2 (mild): Secondary | ICD-10-CM | POA: Diagnosis not present

## 2019-09-08 DIAGNOSIS — Z7982 Long term (current) use of aspirin: Secondary | ICD-10-CM | POA: Insufficient documentation

## 2019-09-08 DIAGNOSIS — Z7951 Long term (current) use of inhaled steroids: Secondary | ICD-10-CM | POA: Insufficient documentation

## 2019-09-08 DIAGNOSIS — K900 Celiac disease: Secondary | ICD-10-CM | POA: Insufficient documentation

## 2019-09-08 DIAGNOSIS — R072 Precordial pain: Secondary | ICD-10-CM | POA: Diagnosis not present

## 2019-09-08 DIAGNOSIS — I10 Essential (primary) hypertension: Secondary | ICD-10-CM | POA: Diagnosis not present

## 2019-09-08 LAB — LACTIC ACID, PLASMA: Lactic Acid, Venous: 1.1 mmol/L (ref 0.5–1.9)

## 2019-09-08 LAB — URINALYSIS, COMPLETE (UACMP) WITH MICROSCOPIC
Bacteria, UA: NONE SEEN
Bilirubin Urine: NEGATIVE
Glucose, UA: 50 mg/dL — AB
Ketones, ur: NEGATIVE mg/dL
Leukocytes,Ua: NEGATIVE
Nitrite: NEGATIVE
Protein, ur: NEGATIVE mg/dL
Specific Gravity, Urine: 1.008 (ref 1.005–1.030)
Squamous Epithelial / HPF: NONE SEEN (ref 0–5)
pH: 5 (ref 5.0–8.0)

## 2019-09-08 LAB — COMPREHENSIVE METABOLIC PANEL
ALT: 22 U/L (ref 0–44)
AST: 17 U/L (ref 15–41)
Albumin: 3.9 g/dL (ref 3.5–5.0)
Alkaline Phosphatase: 75 U/L (ref 38–126)
Anion gap: 12 (ref 5–15)
BUN: 17 mg/dL (ref 8–23)
CO2: 37 mmol/L — ABNORMAL HIGH (ref 22–32)
Calcium: 9.5 mg/dL (ref 8.9–10.3)
Chloride: 91 mmol/L — ABNORMAL LOW (ref 98–111)
Creatinine, Ser: 1.09 mg/dL (ref 0.61–1.24)
GFR calc Af Amer: >60 mL/min (ref >60)
GFR calc non Af Amer: >60 mL/min (ref >60)
Glucose, Bld: 159 mg/dL — ABNORMAL HIGH (ref 70–99)
Potassium: 3.8 mmol/L (ref 3.5–5.1)
Sodium: 140 mmol/L (ref 135–145)
Total Bilirubin: 0.7 mg/dL (ref 0.3–1.2)
Total Protein: 6.8 g/dL (ref 6.5–8.1)

## 2019-09-08 LAB — CBC WITH DIFFERENTIAL/PLATELET
Abs Immature Granulocytes: 0.03 10*3/uL (ref 0.00–0.07)
Basophils Absolute: 0.1 10*3/uL (ref 0.0–0.1)
Basophils Relative: 1 %
Eosinophils Absolute: 0.3 10*3/uL (ref 0.0–0.5)
Eosinophils Relative: 4 %
HCT: 51.3 % (ref 39.0–52.0)
Hemoglobin: 15.6 g/dL (ref 13.0–17.0)
Immature Granulocytes: 0 %
Lymphocytes Relative: 22 %
Lymphs Abs: 1.6 10*3/uL (ref 0.7–4.0)
MCH: 26.7 pg (ref 26.0–34.0)
MCHC: 30.4 g/dL (ref 30.0–36.0)
MCV: 87.7 fL (ref 80.0–100.0)
Monocytes Absolute: 0.6 10*3/uL (ref 0.1–1.0)
Monocytes Relative: 9 %
Neutro Abs: 4.5 10*3/uL (ref 1.7–7.7)
Neutrophils Relative %: 64 %
Platelets: 177 10*3/uL (ref 150–400)
RBC: 5.85 MIL/uL — ABNORMAL HIGH (ref 4.22–5.81)
RDW: 15.7 % — ABNORMAL HIGH (ref 11.5–15.5)
WBC: 7.2 10*3/uL (ref 4.0–10.5)
nRBC: 0 % (ref 0.0–0.2)

## 2019-09-08 LAB — LIPASE, BLOOD: Lipase: 20 U/L (ref 11–51)

## 2019-09-08 LAB — TROPONIN I (HIGH SENSITIVITY)
Troponin I (High Sensitivity): 10 ng/L (ref <18)
Troponin I (High Sensitivity): 11 ng/L (ref <18)

## 2019-09-08 LAB — PROTIME-INR
INR: 1 (ref 0.8–1.2)
Prothrombin Time: 13.4 seconds (ref 11.4–15.2)

## 2019-09-08 IMAGING — CT CT ENTEROGRAPHY (ABD-PELV W/ CM)
1 of 3 series · 14 of 32 positions shown, 19 images · IV contrast (APPLIED)
Comparison: 05/03/2017

CLINICAL DATA: Chronic left upper and lower quadrant pain and
nausea, worsening over past 6 months. Personal history of achalasia
status post myotomy and pancreatitis.

EXAM:
CT ABDOMEN AND PELVIS WITH CONTRAST (ENTEROGRAPHY)
TECHNIQUE: Multidetector CT of the abdomen and pelvis during bolus
administration of intravenous contrast. Negative oral contrast was
given.
CONTRAST:  100mL [id number] IOPAMIDOL ([id number]) INJECTION 76%

[Series 3: entero thins · axial · 0.83mm/px · z∈[-1010,-592]mm · 14 of 235 slices shown, 19 images]
[im 13/235  soft-tissue]
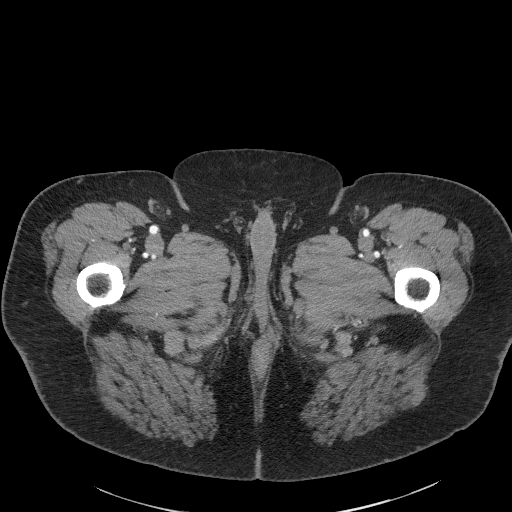
[im 13/235  bone]
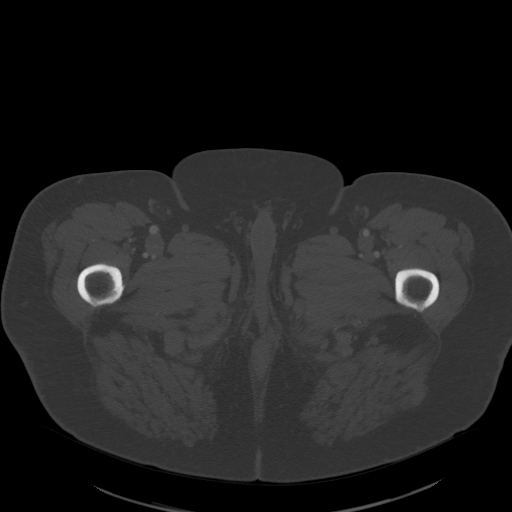
[im 37/235  soft-tissue]
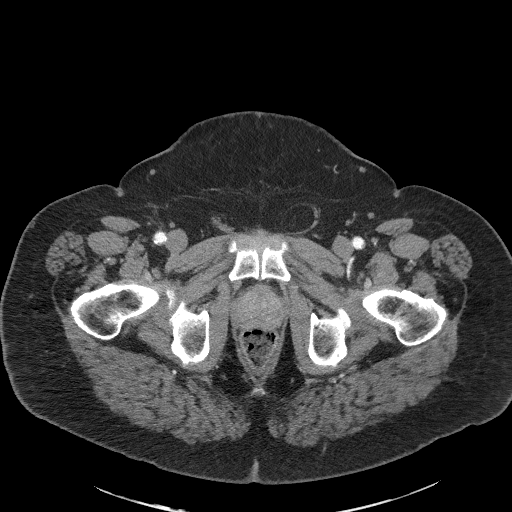
[im 50/235  soft-tissue]
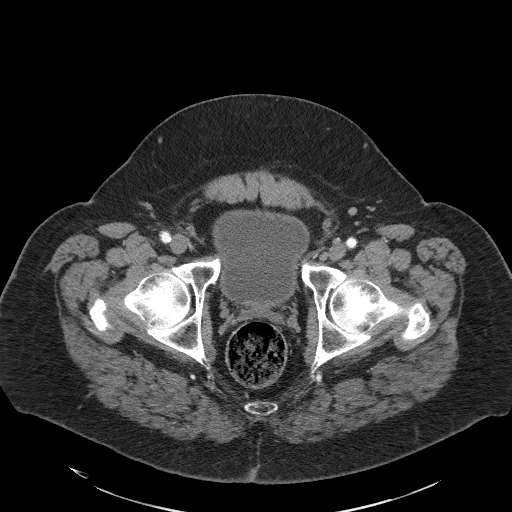
[im 62/235  soft-tissue]
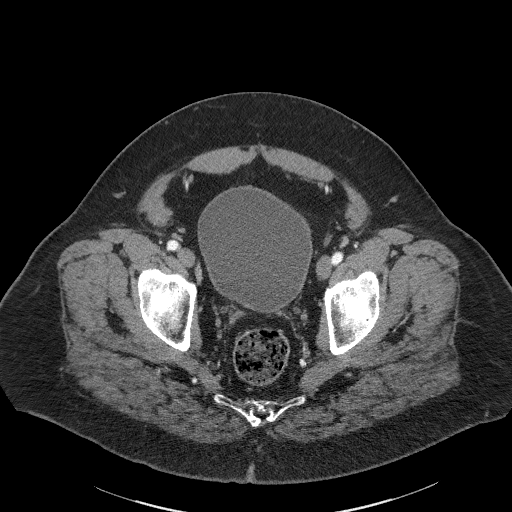
[im 87/235  soft-tissue]
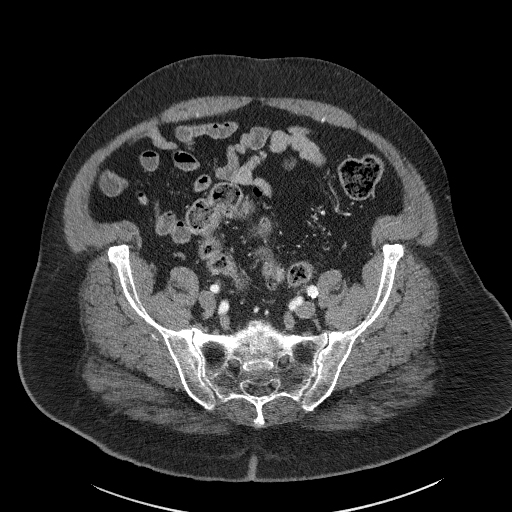
[im 99/235  soft-tissue]
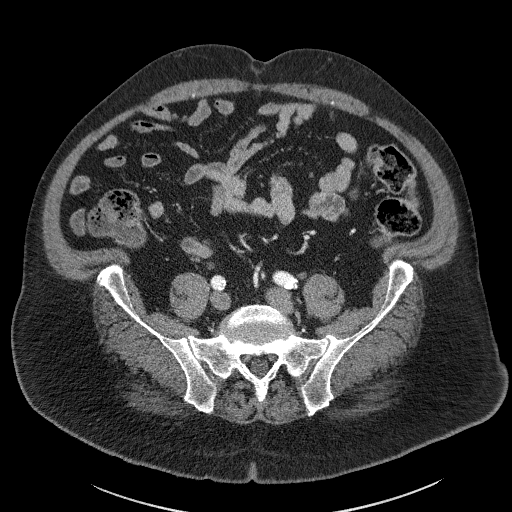
[im 124/235  soft-tissue]
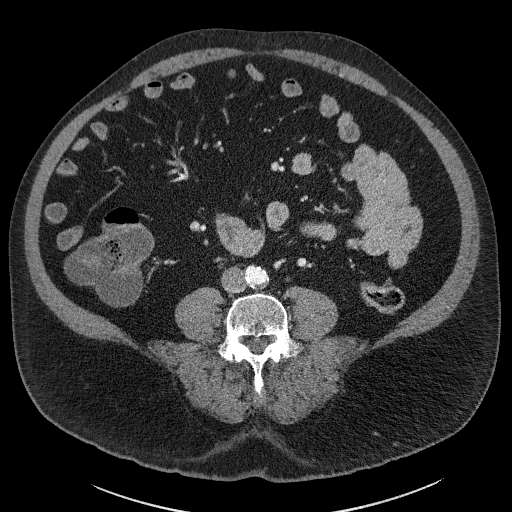
[im 136/235  soft-tissue]
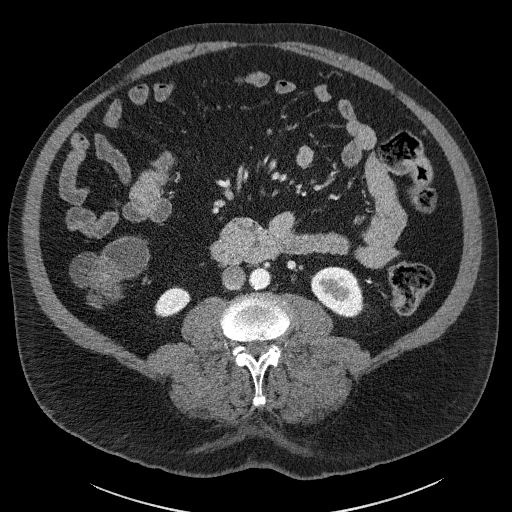
[im 148/235  soft-tissue]
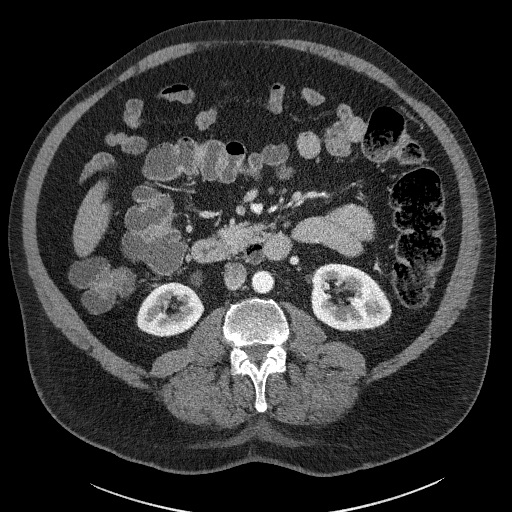
[im 148/235  bone]
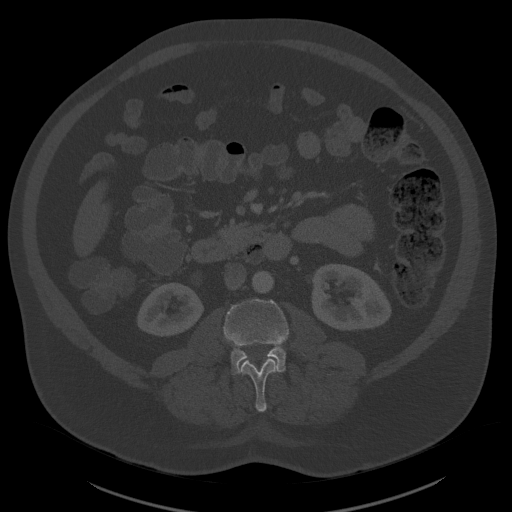
[im 173/235  soft-tissue]
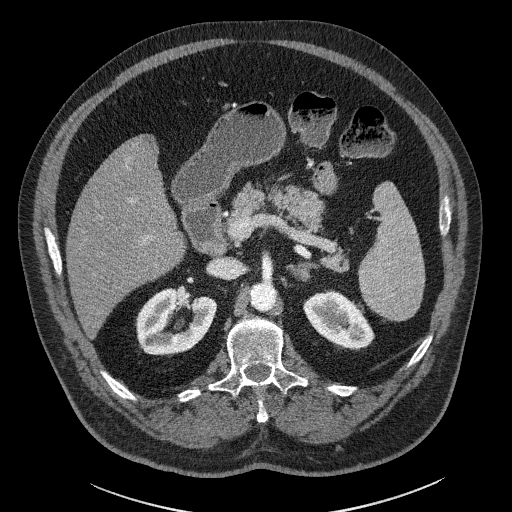
[im 185/235  soft-tissue]
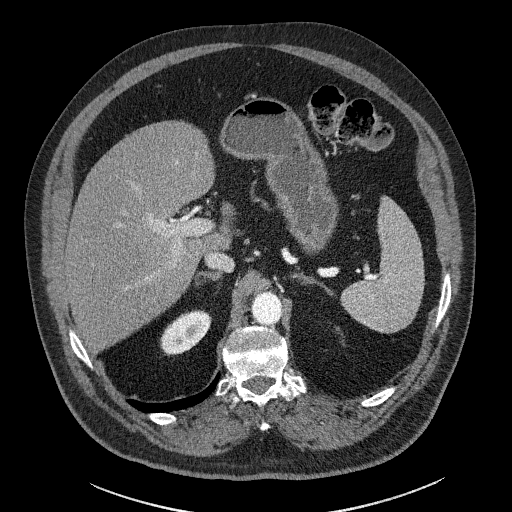
[im 185/235  lung]
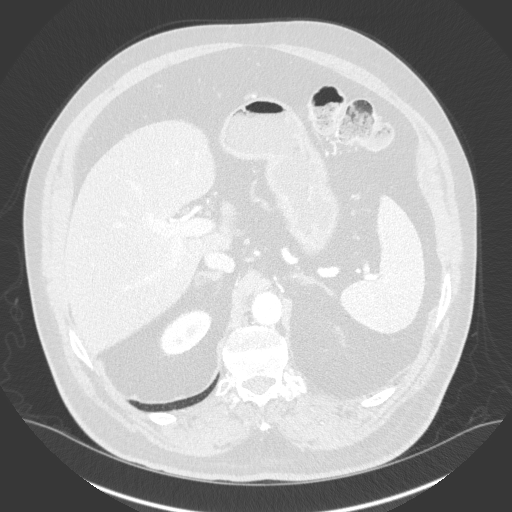
[im 198/235  soft-tissue]
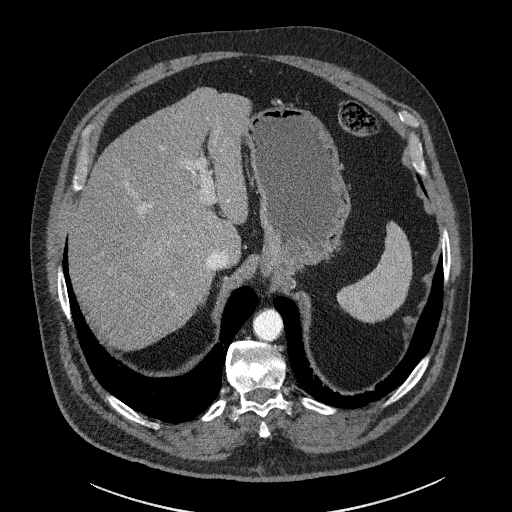
[im 198/235  lung]
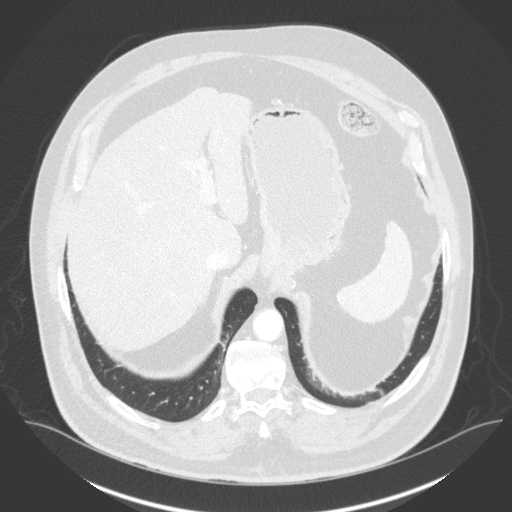
[im 210/235  lung]
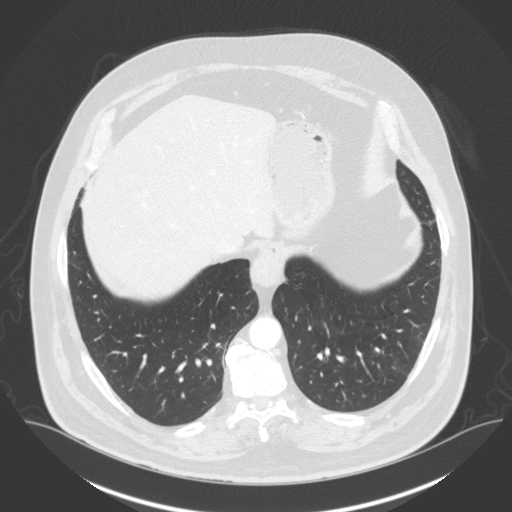
[im 222/235  soft-tissue]
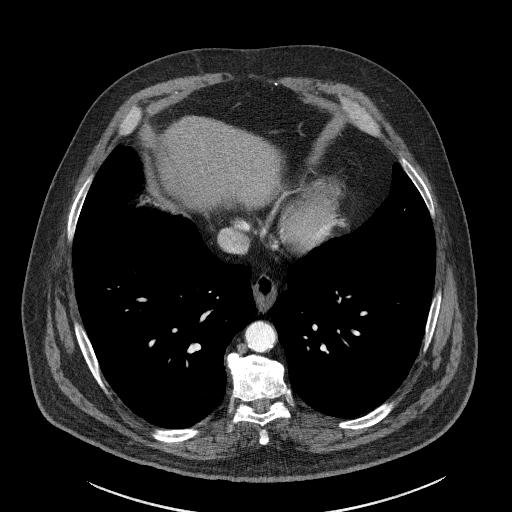
[im 222/235  lung]
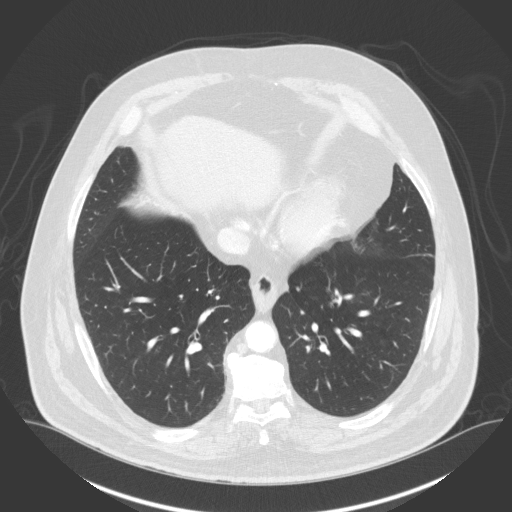

[14 of 32 positions shown; findings below may reference images not displayed]

FINDINGS: Lower Chest: No acute findings.

Hepatobiliary: Mild diffuse hepatic steatosis again noted. No liver
lesions identified. Prior cholecystectomy. No evidence of biliary
obstruction.

Pancreas:  No mass or inflammatory changes.

Spleen: Within normal limits in size and appearance.

Adrenals/Urinary Tract: No masses identified. No evidence of
hydronephrosis.

Stomach/Bowel: Stable mild wall thickening of visualized distal
esophagus, which may be due to chronic esophagitis and/or known
history of achalasia. No evidence of small bowel wall thickening or
dilatation. No evidence of abnormal mucosal enhancement or
mesenteric inflammatory changes. No evidence of enteric fistula,
extraluminal gas or abnormal fluid collections. The terminal ileum
is normal in appearance. No other areas of bowel wall thickening
identified. Normal appendix visualized.

Vascular/Lymphatic: No pathologically enlarged lymph nodes. No
abdominal aortic aneurysm. Aortic atherosclerosis.

Reproductive:  No mass or other significant abnormality.

Other:  Stable small left inguinal hernia containing only fat.

Musculoskeletal:  No suspicious bone lesions identified.
IMPRESSION: Stable mild distal esophageal wall thickening, which may be due to
chronic esophagitis and/or known history of achalasia. No evidence
of enterocolonic pathology, or other acute findings.

Stable mild hepatic steatosis.

Stable small left inguinal hernia containing only fat.

Aortic atherosclerosis.

## 2019-09-08 MED ORDER — ALLOPURINOL 300 MG PO TABS
300.0000 mg | ORAL_TABLET | Freq: Two times a day (BID) | ORAL | Status: DC
  Administered 2019-09-09 – 2019-09-10 (×4): 300 mg via ORAL
  Filled 2019-09-08 (×5): qty 1

## 2019-09-08 MED ORDER — DOCUSATE SODIUM 100 MG PO CAPS
100.0000 mg | ORAL_CAPSULE | Freq: Every day | ORAL | Status: DC
  Administered 2019-09-09 – 2019-09-10 (×2): 100 mg via ORAL
  Filled 2019-09-08 (×2): qty 1

## 2019-09-08 MED ORDER — TAMSULOSIN HCL 0.4 MG PO CAPS
0.40 | ORAL_CAPSULE | ORAL | Status: DC
Start: 2019-09-09 — End: 2019-09-08

## 2019-09-08 MED ORDER — ISOSORBIDE MONONITRATE ER 60 MG PO TB24
120.0000 mg | ORAL_TABLET | Freq: Every day | ORAL | Status: DC
  Administered 2019-09-09 – 2019-09-10 (×2): 120 mg via ORAL
  Filled 2019-09-08 (×2): qty 2

## 2019-09-08 MED ORDER — SOTALOL HCL 80 MG PO TABS
80.00 | ORAL_TABLET | ORAL | Status: DC
Start: 2019-09-08 — End: 2019-09-08

## 2019-09-08 MED ORDER — MORPHINE SULFATE (PF) 4 MG/ML IV SOLN
4.0000 mg | Freq: Once | INTRAVENOUS | Status: AC
  Administered 2019-09-08: 22:00:00 4 mg via INTRAVENOUS
  Filled 2019-09-08: qty 1

## 2019-09-08 MED ORDER — IPRATROPIUM BROMIDE 0.02 % IN SOLN
500.00 | RESPIRATORY_TRACT | Status: DC
Start: 2019-09-08 — End: 2019-09-08

## 2019-09-08 MED ORDER — GENERIC EXTERNAL MEDICATION
Status: DC
Start: ? — End: 2019-09-08

## 2019-09-08 MED ORDER — LINACLOTIDE 72 MCG PO CAPS: 72.0000 ug | ORAL_CAPSULE | Freq: Every day | ORAL | Status: DC

## 2019-09-08 MED ORDER — ATORVASTATIN CALCIUM 80 MG PO TABS
80.00 | ORAL_TABLET | ORAL | Status: DC
Start: 2019-09-08 — End: 2019-09-08

## 2019-09-08 MED ORDER — NITROGLYCERIN 0.4 MG SL SUBL
0.40 | SUBLINGUAL_TABLET | SUBLINGUAL | Status: DC
Start: ? — End: 2019-09-08

## 2019-09-08 MED ORDER — APIXABAN 5 MG PO TABS
5.00 | ORAL_TABLET | ORAL | Status: DC
Start: 2019-09-08 — End: 2019-09-08

## 2019-09-08 MED ORDER — PANTOPRAZOLE SODIUM 20 MG PO TBEC
20.0000 mg | DELAYED_RELEASE_TABLET | Freq: Every day | ORAL | Status: DC
  Administered 2019-09-09 – 2019-09-10 (×2): 20 mg via ORAL
  Filled 2019-09-08 (×2): qty 1

## 2019-09-08 MED ORDER — CETIRIZINE HCL 10 MG PO TABS
10.00 | ORAL_TABLET | ORAL | Status: DC
Start: 2019-09-08 — End: 2019-09-08

## 2019-09-08 MED ORDER — ISOSORBIDE MONONITRATE ER 120 MG PO TB24
120.00 | ORAL_TABLET | ORAL | Status: DC
Start: 2019-09-09 — End: 2019-09-08

## 2019-09-08 MED ORDER — CITALOPRAM HYDROBROMIDE 20 MG PO TABS
20.00 | ORAL_TABLET | ORAL | Status: DC
Start: 2019-09-09 — End: 2019-09-08

## 2019-09-08 MED ORDER — TORSEMIDE 20 MG PO TABS
50.0000 mg | ORAL_TABLET | Freq: Every day | ORAL | Status: DC
  Administered 2019-09-09 – 2019-09-10 (×2): 50 mg via ORAL
  Filled 2019-09-08 (×2): qty 1

## 2019-09-08 MED ORDER — SOTALOL HCL 80 MG PO TABS
80.0000 mg | ORAL_TABLET | Freq: Two times a day (BID) | ORAL | Status: DC
  Administered 2019-09-09 – 2019-09-10 (×4): 80 mg via ORAL
  Filled 2019-09-08 (×5): qty 1

## 2019-09-08 MED ORDER — ASPIRIN 81 MG PO CHEW: 81.0000 mg | CHEWABLE_TABLET | Freq: Every day | ORAL | Status: DC

## 2019-09-08 MED ORDER — PANTOPRAZOLE SODIUM 40 MG PO TBEC: 40.0000 mg | DELAYED_RELEASE_TABLET | Freq: Every day | ORAL | Status: DC

## 2019-09-08 MED ORDER — TIOTROPIUM BROMIDE MONOHYDRATE 18 MCG IN CAPS: 18.0000 ug | ORAL_CAPSULE | Freq: Every day | RESPIRATORY_TRACT | Status: DC

## 2019-09-08 MED ORDER — OXYCODONE-ACETAMINOPHEN 10-325 MG PO TABS: 1.0000 | ORAL_TABLET | ORAL | Status: DC

## 2019-09-08 MED ORDER — LISINOPRIL 5 MG PO TABS
5.0000 mg | ORAL_TABLET | ORAL | Status: DC
  Administered 2019-09-09 – 2019-09-10 (×2): 5 mg via ORAL
  Filled 2019-09-08 (×2): qty 1

## 2019-09-08 MED ORDER — FENTANYL CITRATE (PF) 100 MCG/2ML IJ SOLN: 50.0000 ug | Freq: Once | INTRAMUSCULAR | Status: DC

## 2019-09-08 MED ORDER — METOCLOPRAMIDE HCL 5 MG/ML IJ SOLN
10.0000 mg | Freq: Once | INTRAMUSCULAR | Status: AC
  Administered 2019-09-08: 10 mg via INTRAVENOUS
  Filled 2019-09-08: qty 2

## 2019-09-08 MED ORDER — TORSEMIDE 20 MG PO TABS
20.00 | ORAL_TABLET | ORAL | Status: DC
Start: 2019-09-09 — End: 2019-09-08

## 2019-09-08 MED ORDER — ALLOPURINOL 300 MG PO TABS
300.00 | ORAL_TABLET | ORAL | Status: DC
Start: 2019-09-08 — End: 2019-09-08

## 2019-09-08 MED ORDER — IPRATROPIUM-ALBUTEROL 0.5-2.5 (3) MG/3ML IN SOLN: 3.0000 mL | Freq: Four times a day (QID) | RESPIRATORY_TRACT | Status: DC | PRN

## 2019-09-08 MED ORDER — NITROGLYCERIN 0.4 MG SL SUBL: 0.4000 mg | SUBLINGUAL_TABLET | SUBLINGUAL | Status: DC | PRN

## 2019-09-08 MED ORDER — BUDESONIDE 0.25 MG/2ML IN SUSP
0.5000 mg | Freq: Two times a day (BID) | RESPIRATORY_TRACT | Status: DC
  Administered 2019-09-09 – 2019-09-10 (×3): 0.5 mg via RESPIRATORY_TRACT
  Filled 2019-09-08 (×4): qty 4

## 2019-09-08 MED ORDER — APIXABAN 5 MG PO TABS
5.0000 mg | ORAL_TABLET | Freq: Two times a day (BID) | ORAL | Status: DC
  Administered 2019-09-09 – 2019-09-10 (×4): 5 mg via ORAL
  Filled 2019-09-08 (×4): qty 1

## 2019-09-08 MED ORDER — VITAMIN D 25 MCG (1000 UNIT) PO TABS
5000.0000 [IU] | ORAL_TABLET | Freq: Every day | ORAL | Status: DC
  Administered 2019-09-09 – 2019-09-10 (×2): 5000 [IU] via ORAL
  Filled 2019-09-08 (×2): qty 5

## 2019-09-08 MED ORDER — INSULIN LISPRO 100 UNIT/ML ~~LOC~~ SOLN
0.00 | SUBCUTANEOUS | Status: DC
Start: 2019-09-08 — End: 2019-09-08

## 2019-09-08 MED ORDER — ATORVASTATIN CALCIUM 80 MG PO TABS
80.0000 mg | ORAL_TABLET | Freq: Every day | ORAL | Status: DC
  Administered 2019-09-09 (×2): 80 mg via ORAL
  Filled 2019-09-08 (×2): qty 1

## 2019-09-08 MED ORDER — LORATADINE 10 MG PO TABS
10.0000 mg | ORAL_TABLET | Freq: Every day | ORAL | Status: DC
  Administered 2019-09-09 – 2019-09-10 (×2): 10 mg via ORAL
  Filled 2019-09-08 (×2): qty 1

## 2019-09-08 MED ORDER — ALPRAZOLAM 0.5 MG PO TABS
1.0000 mg | ORAL_TABLET | Freq: Three times a day (TID) | ORAL | Status: DC
  Administered 2019-09-09 – 2019-09-10 (×4): 1 mg via ORAL
  Filled 2019-09-08 (×4): qty 2

## 2019-09-08 MED ORDER — ONDANSETRON HCL 4 MG/2ML IJ SOLN
4.0000 mg | Freq: Once | INTRAMUSCULAR | Status: AC
  Administered 2019-09-08: 21:00:00 4 mg via INTRAVENOUS
  Filled 2019-09-08: qty 2

## 2019-09-08 MED ORDER — CITALOPRAM HYDROBROMIDE 20 MG PO TABS
20.0000 mg | ORAL_TABLET | Freq: Every day | ORAL | Status: DC
  Administered 2019-09-09 – 2019-09-10 (×2): 20 mg via ORAL
  Filled 2019-09-08 (×2): qty 1

## 2019-09-08 MED ORDER — PANTOPRAZOLE SODIUM 20 MG PO TBEC
20.00 | DELAYED_RELEASE_TABLET | ORAL | Status: DC
Start: 2019-09-09 — End: 2019-09-08

## 2019-09-08 MED ORDER — MAGNESIUM OXIDE 400 (241.3 MG) MG PO TABS
400.0000 mg | ORAL_TABLET | ORAL | Status: DC
  Administered 2019-09-09 – 2019-09-10 (×2): 400 mg via ORAL
  Filled 2019-09-08 (×2): qty 1

## 2019-09-08 MED ORDER — ALBUTEROL SULFATE (2.5 MG/3ML) 0.083% IN NEBU: 2.5000 mg | INHALATION_SOLUTION | RESPIRATORY_TRACT | Status: DC | PRN

## 2019-09-08 MED ORDER — HYDROMORPHONE HCL 1 MG/ML IJ SOLN: 1.0000 mg | Freq: Once | INTRAMUSCULAR | Status: DC

## 2019-09-08 MED ORDER — MORPHINE SULFATE (PF) 4 MG/ML IV SOLN
4.0000 mg | Freq: Once | INTRAVENOUS | Status: AC
  Administered 2019-09-08: 21:00:00 4 mg via INTRAVENOUS
  Filled 2019-09-08: qty 1

## 2019-09-08 MED ORDER — LISINOPRIL 10 MG PO TABS
5.00 | ORAL_TABLET | ORAL | Status: DC
Start: 2019-09-09 — End: 2019-09-08

## 2019-09-08 MED ORDER — RANOLAZINE ER 500 MG PO TB12
1000.0000 mg | ORAL_TABLET | Freq: Two times a day (BID) | ORAL | Status: DC
  Administered 2019-09-09 – 2019-09-10 (×3): 1000 mg via ORAL
  Filled 2019-09-08 (×5): qty 2

## 2019-09-08 MED ORDER — MAGNESIUM OXIDE 400 MG PO TABS
400.00 | ORAL_TABLET | ORAL | Status: DC
Start: 2019-09-09 — End: 2019-09-08

## 2019-09-08 NOTE — ED Triage Notes (Signed)
Patient arrived to ER from home by EMS. Patient was released from [facility] earlier today/ Once getting home was having high BP with diastolic 461. Patient called EMS. EMS report normal BP. Patient also states having nose bleed out of both nares.

## 2019-09-08 NOTE — ED Provider Notes (Signed)
[facility] Emergency Department Provider Note ____________________________________________   First MD Initiated Contact with Patient 09/08/19 1933     (approximate)  I have reviewed the triage vital signs and the nursing notes.   HISTORY  Chief Complaint Epistaxis and Chest Pain    HPI Lawrence [name] is a 63 y.o. male with PMH as noted below including extensive CAD history who presents with multiple symptoms.  He reports persistent left-sided and substernal chest pain, a generalized feeling of being unwell, increased shortness of breath, lightheadedness.  He also had a nosebleed this afternoon, and noticed that he was hypertensive.  The patient was just discharged from [facility] after a cardiac work-up.  He was also recently seen at the [facility] ED.  Past Medical History:  Diagnosis Date  . A-fib ([place])   . Anemia   . Anginal pain ([place])   . Anxiety   . Arthritis   . Asthma   . CAD (coronary artery disease)    a. 2002 CABGx2 (LIMA->LAD, VG->VG->OM1);  b. 09/2012 DES->OM;  c. 03/2015 PTCA of LAD [facility]) in setting of atretic LIMA; d. 05/2015 Cath [facility]): nonobs dzs; e. 06/2015 Cath ([facility]): LM nl, LAD 45p/d ISR, 50d, D1/2 small, LCX 50p/d ISR, OM1 70ost, 30 ISR, VG->OM1 50ost, 74m LIMA->LAD 99p/d - atretic, RCA dom, nl; f.cath 10/16: 40-50%(FFR 0.90) pLAD, 75% (FFR 0.77) mLAD s/p PCI/DES, oRCA 40% (FFR0.95)  . Cancer ([place]    SKIN CANCER ON BACK  . Celiac disease   . Chronic diastolic CHF (congestive heart failure) ([place]    a. 06/2009 Echo: EF 60-65%, Gr 1 DD, triv AI, mildly dil LA, nl RV.  .Marland KitchenCOPD (chronic obstructive pulmonary disease) ([place]    a. Chronic bronchitis and emphysema.  . DDD (degenerative disc disease), lumbar   . Diverticulosis   . Dysrhythmia   . Essential hypertension   . GERD (gastroesophageal reflux disease)   . History of hiatal hernia   . History of kidney stones    H/O  . History of tobacco abuse    a. Quit 2014.  .Marland KitchenMyocardial  infarction ([place] 2002   4 STENTS  . Pancreatitis   . PSVT (paroxysmal supraventricular tachycardia) ([place]    a. 10/2012 Noted on Zio Patch.  . Sleep apnea    LOST CORD TO CPAP -ONLY 02 @ BEDTIME  . Tubular adenoma of colon   . Type II diabetes mellitus ([facility]     Patient Active Problem List   Diagnosis Date Noted  . Chronic respiratory failure with hypoxia ([place] 05/29/2019  . Morbid obesity ([place] 05/29/2019  . Trigger thumb of right hand 11/28/2018  . Eye pain, left 08/18/2018  . Acute on chronic heart failure ([place] 04/18/2018  . History of adenomatous polyp of colon 04/05/2018  . Abdominal pain, chronic, epigastric 11/06/2017  . Bilateral flank pain 03/24/2017  . Dyspnea 04/03/2016  . Hypotension 04/03/2016  . CKD (chronic kidney disease) stage 2, GFR 60-89 ml/min 04/03/2016  . Anemia 04/03/2016  . Paroxysmal atrial fibrillation ([place] 12/23/2015  . OSA (obstructive sleep apnea) 12/10/2015  . Left inguinal hernia 11/07/2015  . Anxiety 11/07/2015  . Unstable angina ([place] 10/05/2015  . Back pain with left-sided radiculopathy 09/30/2015  . Nocturnal hypoxia 09/06/2015  . BPH (benign prostatic hyperplasia) 08/01/2015  . Chronic diastolic CHF (congestive heart failure) ([place]   . Angina pectoris ([place]   . Chest pain 07/11/2015  . COPD (chronic obstructive pulmonary disease) (HA[place] 07/03/2015  . CAD (coronary  artery disease) 06/26/2015  . HTN (hypertension) 06/26/2015  . 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[place]) 06/26/2015  . Achalasia 07/24/2014  . GERD (gastroesophageal reflux disease) 06/07/2014  . Former tobacco use 04/11/2013  . HLD (hyperlipidemia) 04/09/2013    Past Surgical History:  Procedure Laterality Date  . BYPASS GRAFT    . CARDIAC CATHETERIZATION N/A 07/12/2015   rocedure: Left Heart Cath and Cors/Grafts Angiography;  Surgeon: Belva Crome, MD;  Location: [place] CV LAB;  Service: Cardiovascular;  Laterality: N/A;  . CARDIAC CATHETERIZATION Right  10/07/2015   Procedure: Left Heart Cath and Cors/Grafts Angiography;  Surgeon: Dionisio David, MD;  Location: [place] CV LAB;  Service: Cardiovascular;  Laterality: Right;  . CARDIAC CATHETERIZATION N/A 04/06/2016   Procedure: Left Heart Cath and Coronary Angiography;  Surgeon: Yolonda Kida, MD;  Location: [place] CV LAB;  Service: Cardiovascular;  Laterality: N/A;  . CARDIAC CATHETERIZATION  04/06/2016   Procedure: Bypass Graft Angiography;  Surgeon: Yolonda Kida, MD;  Location: [place] CV LAB;  Service: Cardiovascular;;  . CARDIAC CATHETERIZATION N/A 11/02/2016   Procedure: Left Heart Cath and Cors/Grafts Angiography and possible PCI;  Surgeon: Yolonda Kida, MD;  Location: [place] CV LAB;  Service: Cardiovascular;  Laterality: N/A;  . CARDIAC CATHETERIZATION N/A 11/02/2016   Procedure: Coronary Stent Intervention;  Surgeon: Yolonda Kida, MD;  Location: [facility] CV LAB;  Service: Cardiovascular;  Laterality: N/A;  . CHOLECYSTECTOMY    . CIRCUMCISION N/A 06/09/2019   Procedure: CIRCUMCISION ADULT;  Surgeon: Billey Co, MD;  Location: [facility] ORS;  Service: Urology;  Laterality: N/A;  . COLONOSCOPY WITH PROPOFOL N/A 04/01/2018   Procedure: COLONOSCOPY WITH PROPOFOL;  Surgeon: Manya Silvas, MD;  Location: [facility] ENDOSCOPY;  Service: Endoscopy;  Laterality: N/A;  . ESOPHAGEAL DILATION    . ESOPHAGOGASTRODUODENOSCOPY (EGD) WITH PROPOFOL N/A 04/01/2018   Procedure: ESOPHAGOGASTRODUODENOSCOPY (EGD) WITH PROPOFOL;  Surgeon: Manya Silvas, MD;  Location: [facility] ENDOSCOPY;  Service: Endoscopy;  Laterality: N/A;  . LEFT HEART CATH AND CORS/GRAFTS ANGIOGRAPHY N/A 06/12/2019   Procedure: LEFT HEART CATH AND CORS/GRAFTS ANGIOGRAPHY;  Surgeon: Teodoro Spray, MD;  Location: [place] CV LAB;  Service: Cardiovascular;  Laterality: N/A;  . TONSILLECTOMY    . VASCULAR SURGERY      Prior to Admission medications   Medication Sig Start Date End Date  Taking? Authorizing Provider  allopurinol (ZYLOPRIM) 300 MG tablet Take 1 tablet (300 mg total) by mouth 2 (two) times daily. 10/05/18  Yes Birdie Sons, MD  ALPRAZolam Duanne Moron) 1 MG tablet Take 1 tablet (1 mg total) by mouth 3 (three) times daily. 06/23/19  Yes Birdie Sons, MD  arformoterol (BROVANA) 15 MCG/2ML NEBU Take 15 mcg by nebulization 2 (two) times daily.   Yes [provider]  atorvastatin (LIPITOR) 80 MG tablet TAKE ONE TABLET BY MOUTH AT BEDTIME. Patient taking differently: Take 80 mg by mouth at bedtime.  09/19/18  Yes Birdie Sons, MD  Blood Glucose Monitoring Suppl (ACCU-CHEK AVIVA PLUS) w/Device KIT CHECK BLOOD SUGAR EVERY DAY 12/08/17  Yes Birdie Sons, MD  budesonide (PULMICORT) 0.5 MG/2ML nebulizer solution Take 0.5 mg by nebulization 2 (two) times daily.   Yes [provider]  cetirizine (ZYRTEC) 10 MG tablet TAKE ONE TABLET BY MOUTH AT BEDTIME 08/01/19  Yes Birdie Sons, MD  citalopram (CELEXA) 20 MG tablet Take 20 mg by mouth daily.   Yes [provider]  ELIQUIS 5 MG  TABS tablet TAKE 1 TABLET BY MOUTH TWICE A DAY 08/10/19  Yes Birdie Sons, MD  glucose blood (ACCU-CHEK AVIVA PLUS) test strip Use to check blood sugar once a day 02/14/18  Yes [name], [name], MD  insulin lispro (HUMALOG) 100 UNIT/ML injection Inject 0-12 Units into the skin 3 (three) times daily before meals. As directed   Yes [provider]  ipratropium-albuterol (DUONEB) 0.5-2.5 (3) MG/3ML SOLN Take 3 mLs by nebulization every 6 (six) hours as needed.   Yes [provider]  isosorbide mononitrate (IMDUR) 120 MG 24 hr tablet Take 1 tablet (120 mg total) by mouth daily. 08/10/19  Yes Birdie Sons, MD  lisinopril (PRINIVIL,ZESTRIL) 2.5 MG tablet TAKE 1 TABLET BY MOUTH DAILY Patient taking differently: Take 5 mg by mouth every morning.  10/20/18  Yes Birdie Sons, MD  magnesium oxide (MAG-OX) 400 (241.3 Mg) MG tablet Take 1 tablet (400 mg total)  by mouth every morning. 06/13/19  Yes Hackney, Tina A, FNP  oxyCODONE-acetaminophen (PERCOCET) 10-325 MG tablet Take 1 tablet by mouth every 4 (four) hours. 08/18/19  Yes Birdie Sons, MD  OXYGEN Inhale 2 L into the lungs continuous.    Yes [provider]  pantoprazole (PROTONIX) 20 MG tablet Take 20 mg by mouth daily.   Yes [provider]  ranolazine (RANEXA) 500 MG 12 hr tablet Take 1 tablet (500 mg total) by mouth 2 (two) times daily. Patient taking differently: Take 1,000 mg by mouth 2 (two) times daily.  06/12/19  Yes Dustin Flock, MD  sotalol (BETAPACE) 80 MG tablet Take 80 mg by mouth 2 (two) times daily.   Yes [provider]  torsemide (DEMADEX) 100 MG tablet Take 50 mg by mouth daily.  10/11/17  Yes [provider]  Albuterol Sulfate, sensor, 108 (90 Base) MCG/ACT AEPB Inhale 2 puffs into the lungs at bedtime. AND PRN    [provider]  aspirin 81 MG chewable tablet Chew 81 mg by mouth daily.    [provider]  [place] Patter 100-25 MCG/INH AEPB INHALE 1 PUFF INTO THE LUNGS DAILY Patient not taking: Reported on 09/08/2019 06/13/19   Kendell Bane, NP  Cholecalciferol (VITAMIN D3) 125 MCG (5000 UT) CAPS Take 5,000 Units by mouth daily.    [provider]  dapagliflozin propanediol (FARXIGA) 10 MG TABS tablet Take 10 mg by mouth daily. Patient not taking: Reported on 09/08/2019 05/29/19   Birdie Sons, MD  docusate sodium (COLACE) 100 MG capsule Take 100 mg by mouth daily.     [provider]  LINZESS 72 MCG capsule Take 72 mcg by mouth daily before breakfast.     [provider]  methocarbamol (ROBAXIN) 500 MG tablet Take 1 tablet (500 mg total) by mouth 4 (four) times daily. Patient not taking: Reported on 09/08/2019 05/29/19   Birdie Sons, MD  nitroGLYCERIN (NITROSTAT) 0.4 MG SL tablet Place 1 tablet (0.4 mg total) under the tongue every 5 (five) minutes as needed for chest pain. Reported on  05/26/2016 11/28/18   Birdie Sons, MD  nystatin-triamcinolone (MYCOLOG II) cream Apply 1 application topically 2 (two) times daily. Patient not taking: Reported on 09/08/2019 06/29/19   Billey Co, MD  omega-3 acid ethyl esters (LOVAZA) 1 g capsule Take 4 capsules (4 g total) by mouth daily. Patient not taking: Reported on 09/08/2019 06/26/19   Birdie Sons, MD  omeprazole (PRILOSEC) 40 MG capsule Take 40 mg by mouth  2 (two) times a day.  04/24/19   [provider]  tiotropium (SPIRIVA HANDIHALER) 18 MCG inhalation capsule Place 1 capsule (18 mcg total) into inhaler and inhale daily. Patient not taking: Reported on 09/08/2019 02/20/19   Birdie Sons, MD  traZODone (DESYREL) 100 MG tablet TAKE 1 TABLET BY MOUTH AT BEDTIME FOR SLEEP Patient not taking: Reported on 09/08/2019 08/28/19   Birdie Sons, MD  venlafaxine XR (EFFEXOR-XR) 75 MG 24 hr capsule Take 1 capsule (75 mg total) by mouth daily with breakfast. Patient not taking: Reported on 09/08/2019 04/04/19   Birdie Sons, MD    Allergies Demerol  [meperidine hcl], Demerol [meperidine], Jardiance [empagliflozin], Prednisone, Sulfa antibiotics, Albuterol sulfate [albuterol], and Morphine sulfate  Family History  Problem Relation Age of Onset  . Heart attack Mother   . Depression Mother   . Heart disease Mother   . COPD Mother   . Hypertension Mother   . Heart attack Father   . Diabetes Father   . Depression Father   . Heart disease Father   . Cirrhosis Father   . Parkinson's disease Brother     Social History Social History   Tobacco Use  . Smoking status: Former Smoker    Packs/day: 3.00    Years: 50.00    Pack years: 150.00    Types: Cigarettes    Quit date: 04/22/2013    Years since quitting: 6.3  . Smokeless tobacco: Never Used  Substance Use Topics  . Alcohol use: No    Comment: remotely quit alcohol use. Hx of heavy alcohol use.  . Drug use: No    Review of Systems  Constitutional: No  fever.  Positive for generalized weakness. Eyes: No redness. ENT: No sore throat. Cardiovascular: Positive for chest pain. Respiratory: Positive for shortness of breath. Gastrointestinal: No vomiting or diarrhea.  Genitourinary: Negative for flank pain. Musculoskeletal: Negative for back pain. Skin: Negative for rash. Neurological: Negative for headache.   ____________________________________________   PHYSICAL EXAM:  VITAL SIGNS: ED Triage Vitals [09/08/19 1931]  Enc Vitals Group     BP      Pulse      Resp      Temp      Temp src      SpO2 94 %     Weight      Height      Head Circumference      Peak Flow      Pain Score      Pain Loc      Pain Edu?      Excl. in [place]?     Constitutional: Alert and oriented.  Somewhat uncomfortable appearing but in no acute distress. Eyes: Conjunctivae are normal.  Head: Atraumatic. Nose: No congestion/rhinnorhea.  Trace dried blood to bilateral anterior nasal passages with no active bleeding. Mouth/Throat: Mucous membranes are moist.   Neck: Normal range of motion.  Cardiovascular: Normal rate, regular rhythm. Good peripheral circulation. Respiratory: Normal respiratory effort.  No retractions.  Gastrointestinal: No distention.  Musculoskeletal: No lower extremity edema.  Extremities warm and well perfused.  Neurologic:  Normal speech and language. No gross focal neurologic deficits are appreciated.  Skin:  Skin is warm and dry. No rash noted. Psychiatric: Mood and affect are normal. Speech and behavior are normal.  ____________________________________________   LABS (all labs ordered are listed, but only abnormal results are displayed)  Labs Reviewed  COMPREHENSIVE METABOLIC PANEL - Abnormal; Notable for the following components:  Result Value   Chloride 91 (*)    CO2 37 (*)    Glucose, Bld 159 (*)    All other components within normal limits  CBC WITH DIFFERENTIAL/PLATELET - Abnormal; Notable for the following  components:   RBC 5.85 (*)    RDW 15.7 (*)    All other components within normal limits  URINALYSIS, COMPLETE (UACMP) WITH MICROSCOPIC - Abnormal; Notable for the following components:   Color, Urine STRAW (*)    APPearance CLEAR (*)    Glucose, UA 50 (*)    Hgb urine dipstick SMALL (*)    All other components within normal limits  LACTIC ACID, PLASMA  PROTIME-INR  LIPASE, BLOOD  TROPONIN I (HIGH SENSITIVITY)  TROPONIN I (HIGH SENSITIVITY)   ____________________________________________  EKG  ED ECG REPORT I, Arta Silence, the attending physician, personally viewed and interpreted this ECG.  Date: 09/08/2019 EKG Time: 1938 Rate: 67 Rhythm: normal sinus rhythm QRS Axis: normal Intervals: normal ST/T Wave abnormalities: Nonspecific T wave abnormalities laterally Narrative Interpretation: no evidence of acute ischemia; nonspecific T wave abnormalities with no significant change when compared to EKG of 09/04/2019  ____________________________________________  RADIOLOGY  CXR: Cardiomegaly without focal infiltrate or acute abnormality  ____________________________________________   PROCEDURES  Procedure(s) performed: No  Procedures  Critical Care performed: No ____________________________________________   INITIAL IMPRESSION / ASSESSMENT AND PLAN / ED COURSE  Pertinent labs & imaging results that were available during my care of the patient were reviewed by me and considered in my medical decision making (see chart for details).  63 year old male with PMH as noted below including history of CAD status post CABG, paroxysmal atrial fibrillation, and COPD presents with chest pain, a resolved nosebleed, shortness of breath, and a generalized feeling of being unwell.  I reviewed the past medical records in [place].  The patient was just discharged from [facility] today after an admission and ACS rule out.  The most recent progress note from  today is copied below:    Lopez Dentinger is a 63 y.o. male hypertension, CAD SP CABG,atrial fibrillation (paroxysmal), COPD and diabetes with chronic angina who presented to [facility] with chest pressure/discomfort.  Chest Pain: Recently admitted and discharged from [facility] , 48 prior to presentation for same symptoms. Ruled out. CABG in 2019 for similar symptoms was w/o new lesions. Pt continues to report chest discomfort and tightness. Post stress test today. Requesting IV morphine or Dilaudid to control pain. Has a h/o chronic pain and takes 60 mg oxycodone daily. Pt with systolics often in the 74'M and occasionally to the 80s I am curious if he is experiencing mild hypo profusion causing chest discomfort. Unclear if this is happening in the outpatient setting.  -Await results of Nuc Med test -Re-Check Lipase -Pt would like f/u with [facility] -Monitor BPs overnight when pt active and again on normal diet. Consider reducing dose of IMDUR or lisinopril if systolics remain low  Chronic Respiratory Acidosis: Pt chem with elevated bicarb. Suspect his is a chronic retainer based on COPD. If pt has never been tested for OSA should consider doing so.   In addition, I see that the patient was seen in the ED on 08/26/2019 and 09/04/2019 with negative work-ups.  He had a negative CT chest on 09/04/2019.  He had a cardiac catheterization here in July which showed several lesions but no acute stenosis.  On exam currently the patient is relatively comfortable appearing.  His vital signs are normal.  The physical exam does not demonstrate acute findings.  He has no active epistaxis at this time.  EKG shows nonspecific lateral T wave inversions with a similar morphology to prior EKGs here.  Given the negative recent work-ups, overall my suspicion for ACS, PE, or other acute cardiac or vascular etiology is extremely low.  Overall I suspect most likely exacerbation of chronic chest pain, versus possible unstable angina.   We will obtain lab work-up including cardiac enzymes, chest x-ray, and reassess.  ----------------------------------------- 11:48 PM on 09/08/2019 -----------------------------------------  ED workup so far is negative, however the patient continues to report persistent chest pain.  We will admit for further observation given his extensive CAD history and elevated risk.   __________________________  [name] was evaluated in Emergency Department on 09/08/2019 for the symptoms described in the history of present illness. He was evaluated in the context of the global COVID-19 pandemic, which necessitated consideration that the patient might be at risk for infection with the SARS-CoV-2 virus that causes COVID-19. Institutional protocols and algorithms that pertain to the evaluation of patients at risk for COVID-19 are in a state of rapid change based on information released by regulatory bodies including the CDC and federal and state organizations. These policies and algorithms were followed during the patient's care in the ED. ____________________________________________   FINAL CLINICAL IMPRESSION(S) / ED DIAGNOSES  Final diagnoses:  Chest pain, unspecified type      NEW MEDICATIONS STARTED DURING THIS VISIT:  New Prescriptions   No medications on file     Note:  This document was prepared using Dragon voice recognition software and may include unintentional dictation errors.   Arta Silence, MD 09/08/19 2348

## 2019-09-09 DIAGNOSIS — F411 Generalized anxiety disorder: Secondary | ICD-10-CM | POA: Diagnosis not present

## 2019-09-09 DIAGNOSIS — I480 Paroxysmal atrial fibrillation: Secondary | ICD-10-CM | POA: Diagnosis not present

## 2019-09-09 DIAGNOSIS — I25790 Atherosclerosis of other coronary artery bypass graft(s) with unstable angina pectoris: Secondary | ICD-10-CM | POA: Diagnosis not present

## 2019-09-09 DIAGNOSIS — Z634 Disappearance and death of family member: Secondary | ICD-10-CM | POA: Diagnosis not present

## 2019-09-09 DIAGNOSIS — R040 Epistaxis: Secondary | ICD-10-CM | POA: Diagnosis not present

## 2019-09-09 DIAGNOSIS — I10 Essential (primary) hypertension: Secondary | ICD-10-CM | POA: Diagnosis not present

## 2019-09-09 DIAGNOSIS — F339 Major depressive disorder, recurrent, unspecified: Secondary | ICD-10-CM

## 2019-09-09 DIAGNOSIS — I2510 Atherosclerotic heart disease of native coronary artery without angina pectoris: Secondary | ICD-10-CM | POA: Diagnosis not present

## 2019-09-09 DIAGNOSIS — R001 Bradycardia, unspecified: Secondary | ICD-10-CM | POA: Diagnosis not present

## 2019-09-09 DIAGNOSIS — R079 Chest pain, unspecified: Secondary | ICD-10-CM | POA: Diagnosis not present

## 2019-09-09 LAB — LIPID PANEL
Cholesterol: 110 mg/dL (ref 0–200)
HDL: 31 mg/dL — ABNORMAL LOW (ref >40)
LDL Cholesterol: 44 mg/dL (ref 0–99)
Total CHOL/HDL Ratio: 3.5 RATIO
Triglycerides: 175 mg/dL — ABNORMAL HIGH (ref <150)
VLDL: 35 mg/dL (ref 0–40)

## 2019-09-09 LAB — TROPONIN I (HIGH SENSITIVITY): Troponin I (High Sensitivity): 10 ng/L (ref <18)

## 2019-09-09 LAB — GLUCOSE, CAPILLARY: Glucose-Capillary: 127 mg/dL — ABNORMAL HIGH (ref 70–99)

## 2019-09-09 LAB — SARS CORONAVIRUS 2 (TAT 6-24 HRS): SARS Coronavirus 2: NEGATIVE

## 2019-09-09 MED ORDER — MORPHINE SULFATE (PF) 2 MG/ML IV SOLN
2.0000 mg | INTRAVENOUS | Status: DC | PRN
  Administered 2019-09-09 (×5): 2 mg via INTRAVENOUS
  Filled 2019-09-09 (×5): qty 1

## 2019-09-09 MED ORDER — ALBUTEROL SULFATE (2.5 MG/3ML) 0.083% IN NEBU: 2.5000 mg | INHALATION_SOLUTION | Freq: Every evening | RESPIRATORY_TRACT | Status: DC | PRN

## 2019-09-09 MED ORDER — OXYCODONE HCL 5 MG PO TABS
5.0000 mg | ORAL_TABLET | ORAL | Status: DC | PRN
  Administered 2019-09-09 – 2019-09-10 (×3): 5 mg via ORAL
  Filled 2019-09-09 (×3): qty 1

## 2019-09-09 MED ORDER — LIDOCAINE VISCOUS HCL 2 % MT SOLN
15.0000 mL | Freq: Once | OROMUCOSAL | Status: DC
  Filled 2019-09-09: qty 15

## 2019-09-09 MED ORDER — ACETAMINOPHEN 325 MG PO TABS
650.0000 mg | ORAL_TABLET | ORAL | Status: DC | PRN
  Filled 2019-09-09: qty 2

## 2019-09-09 MED ORDER — ZOLPIDEM TARTRATE 5 MG PO TABS: 5.0000 mg | ORAL_TABLET | Freq: Every evening | ORAL | Status: DC | PRN

## 2019-09-09 MED ORDER — OXYCODONE-ACETAMINOPHEN 5-325 MG PO TABS: 1.0000 | ORAL_TABLET | ORAL | Status: DC | PRN

## 2019-09-09 MED ORDER — ONDANSETRON HCL 4 MG/2ML IJ SOLN
4.0000 mg | Freq: Four times a day (QID) | INTRAMUSCULAR | Status: DC | PRN
  Administered 2019-09-09: 4 mg via INTRAVENOUS
  Filled 2019-09-09: qty 2

## 2019-09-09 MED ORDER — SODIUM CHLORIDE 0.9 % IV SOLN
INTRAVENOUS | Status: DC
  Administered 2019-09-09: 03:00:00 via INTRAVENOUS

## 2019-09-09 MED ORDER — ENOXAPARIN SODIUM 40 MG/0.4ML ~~LOC~~ SOLN: 40.0000 mg | SUBCUTANEOUS | Status: DC

## 2019-09-09 MED ORDER — HYDROMORPHONE HCL 1 MG/ML IJ SOLN
0.5000 mg | INTRAMUSCULAR | Status: DC | PRN
  Administered 2019-09-10 (×3): 0.5 mg via INTRAVENOUS
  Filled 2019-09-09 (×4): qty 1

## 2019-09-09 MED ORDER — ALUM & MAG HYDROXIDE-SIMETH 200-200-20 MG/5ML PO SUSP
30.0000 mL | Freq: Once | ORAL | Status: AC
  Administered 2019-09-09: 30 mL via ORAL
  Filled 2019-09-09: qty 30

## 2019-09-09 NOTE — Consult Note (Signed)
[facility] Face-to-Face Psychiatry Consult   Reason for Consult:  Bereavement  Referring Physician:  Dr Posey Pronto Patient Identification: ARTUR WINNINGHAM MRN:  [id number] Principal Diagnosis: Chest pain Diagnosis:  Active Problems:   Bereavement   Chest pain  Total Time spent with patient: 1 hour  Subjective:   UMAR PATMON is a 63 y.o. male patient admitted with chest pain.  Patient seen and evaluated in person by this provider.  His depression increased after the death of his wife a month ago.  Today he expresses depression and anxiety related to this loss and health issues.  Denies suicidal/homicidal ideations, hallucinations, and subs abuse.  His son is supportive and lives approximately a block from his home with his 3 grandchildren.  This is a great support for him.  Discussed medications and going to therapy.  Agreeable to the plan, no inpatient psychiatry admission needed at this time.  HPI per MD:  Yakov Bergen  is a 63 y.o. Caucasian male with a known history of multiple medical problems that are mentioned below, including coronary artery disease status post CABG as well as PCI and stents who presented to the emergency room with onset of left-sided chest pain felt as pressure as if an elephant is sitting on his chest as well as nausea and diaphoresis without vomiting or dyspnea or palpitations.  He admitted to mild wheezing earlier without cough.  No fever or chills no headache or dizziness or blurred vision.  He had epistaxis when he came to the ER and that apparently resolved spontaneously.  Past Psychiatric History: depression and anxiety  Risk to Self:  none Risk to Others:  none Prior Inpatient Therapy:  none Prior Outpatient Therapy:  none  Past Medical History:  Past Medical History:  Diagnosis Date  . A-fib ([place])   . Anemia   . Anginal pain ([place])   . Anxiety   . Arthritis   . Asthma   . CAD (coronary artery disease)    a. 2002 CABGx2 (LIMA->LAD, VG->VG->OM1);  b.  09/2012 DES->OM;  c. 03/2015 PTCA of LAD [facility]) in setting of atretic LIMA; d. 05/2015 Cath [facility]): nonobs dzs; e. 06/2015 Cath ([facility]): LM nl, LAD 45p/d ISR, 50d, D1/2 small, LCX 50p/d ISR, OM1 70ost, 30 ISR, VG->OM1 50ost, 3m LIMA->LAD 99p/d - atretic, RCA dom, nl; f.cath 10/16: 40-50%(FFR 0.90) pLAD, 75% (FFR 0.77) mLAD s/p PCI/DES, oRCA 40% (FFR0.95)  . Cancer ([place]    SKIN CANCER ON BACK  . Celiac disease   . Chronic diastolic CHF (congestive heart failure) ([place]    a. 06/2009 Echo: EF 60-65%, Gr 1 DD, triv AI, mildly dil LA, nl RV.  .Marland KitchenCOPD (chronic obstructive pulmonary disease) ([place]    a. Chronic bronchitis and emphysema.  . DDD (degenerative disc disease), lumbar   . Diverticulosis   . Dysrhythmia   . Essential hypertension   . GERD (gastroesophageal reflux disease)   . History of hiatal hernia   . History of kidney stones    H/O  . History of tobacco abuse    a. Quit 2014.  .Marland KitchenMyocardial infarction ([place] 2002   4 STENTS  . Pancreatitis   . PSVT (paroxysmal supraventricular tachycardia) ([place]    a. 10/2012 Noted on Zio Patch.  . Sleep apnea    LOST CORD TO CPAP -ONLY 02 @ BEDTIME  . Tubular adenoma of colon   . Type II diabetes mellitus ([place]     Past Surgical History:  Procedure Laterality Date  .  BYPASS GRAFT    . CARDIAC CATHETERIZATION N/A 07/12/2015   rocedure: Left Heart Cath and Cors/Grafts Angiography;  Surgeon: Belva Crome, MD;  Location: [place] [facility];  Service: Cardiovascular;  Laterality: N/A;  . CARDIAC CATHETERIZATION Right 10/07/2015   Procedure: Left Heart Cath and Cors/Grafts Angiography;  Surgeon: Dionisio David, MD;  Location: [place] [facility];  Service: Cardiovascular;  Laterality: Right;  . CARDIAC CATHETERIZATION N/A 04/06/2016   Procedure: Left Heart Cath and Coronary Angiography;  Surgeon: Yolonda Kida, MD;  Location: [place] [facility];  Service: Cardiovascular;  Laterality: N/A;  . CARDIAC CATHETERIZATION  04/06/2016   Procedure:  Bypass Graft Angiography;  Surgeon: Yolonda Kida, MD;  Location: [place] [facility];  Service: Cardiovascular;;  . CARDIAC CATHETERIZATION N/A 11/02/2016   Procedure: Left Heart Cath and Cors/Grafts Angiography and possible PCI;  Surgeon: Yolonda Kida, MD;  Location: [place] [facility];  Service: Cardiovascular;  Laterality: N/A;  . CARDIAC CATHETERIZATION N/A 11/02/2016   Procedure: Coronary Stent Intervention;  Surgeon: Yolonda Kida, MD;  Location: [place] [facility];  Service: Cardiovascular;  Laterality: N/A;  . CHOLECYSTECTOMY    . CIRCUMCISION N/A 06/09/2019   Procedure: CIRCUMCISION ADULT;  Surgeon: Billey Co, MD;  Location: [facility] ORS;  Service: Urology;  Laterality: N/A;  . COLONOSCOPY WITH PROPOFOL N/A 04/01/2018   Procedure: COLONOSCOPY WITH PROPOFOL;  Surgeon: Manya Silvas, MD;  [facility];  Service: Endoscopy;  Laterality: N/A;  . ESOPHAGEAL DILATION    . ESOPHAGOGASTRODUODENOSCOPY (EGD) WITH PROPOFOL N/A 04/01/2018   Procedure: ESOPHAGOGASTRODUODENOSCOPY (EGD) WITH PROPOFOL;  Surgeon: Manya Silvas, MD;  Location: [facility] ENDOSCOPY;  Service: Endoscopy;  Laterality: N/A;  . LEFT HEART CATH AND CORS/GRAFTS ANGIOGRAPHY N/A 06/12/2019   Procedure: LEFT HEART CATH AND CORS/GRAFTS ANGIOGRAPHY;  Surgeon: Teodoro Spray, MD;  Location: [place] [facility];  Service: Cardiovascular;  Laterality: N/A;  . TONSILLECTOMY    . VASCULAR SURGERY     Family History:  Family History  Problem Relation Age of Onset  . Heart attack Mother   . Depression Mother   . Heart disease Mother   . COPD Mother   . Hypertension Mother   . Heart attack Father   . Diabetes Father   . Depression Father   . Heart disease Father   . Cirrhosis Father   . Parkinson's disease Brother    Family Psychiatric  History: see above Social History:  Social History   Substance and Sexual Activity  Alcohol Use No   Comment: remotely quit alcohol use. Hx of heavy  alcohol use.     Social History   Substance and Sexual Activity  Drug Use No    Social History   Socioeconomic History  . Marital status: Widowed    Spouse name: Not on file  . Number of children: 1  . Years of education: Not on file  . Highest education level: 10th grade  Occupational History  . Occupation: Disabled  Social Needs  . Financial resource strain: Not hard at all  . Food insecurity    Worry: Never true    Inability: Never true  . Transportation needs    Medical: No    Non-medical: No  Tobacco Use  . Smoking status: Former Smoker    Packs/day: 3.00    Years: 50.00    Pack years: 150.00    Types: Cigarettes    Quit date: 04/22/2013    Years since  quitting: 6.3  . Smokeless tobacco: Never Used  Substance and Sexual Activity  . Alcohol use: No    Comment: remotely quit alcohol use. Hx of heavy alcohol use.  . Drug use: No  . Sexual activity: Not on file  Lifestyle  . Physical activity    Days per week: Not on file    Minutes per session: Not on file  . Stress: Only a little  Relationships  . Social Herbalist on phone: Not on file    Gets together: Not on file    Attends religious service: Not on file    Active member of club or organization: Not on file    Attends meetings of clubs or organizations: Not on file    Relationship status: Not on file  Other Topics Concern  . Not on file  Social History Narrative   Pt lives in [place] with wife.  Does not routinely exercise.   Additional Social History:    Allergies:   Allergies  Allergen Reactions  . Demerol  [Meperidine Hcl]   . Demerol [Meperidine] Hives  . Jardiance [Empagliflozin] Other (See Comments)    Perineal pain  . Prednisone Other (See Comments) and Hypertension    Pt states that this medication puts him in A-fib   . Sulfa Antibiotics Hives  . Albuterol Sulfate [Albuterol] Palpitations and Other (See Comments)    Pt currently uses this medication.    . Morphine  Sulfate Nausea And Vomiting, Rash and Other (See Comments)    Pt states that he is only allergic to the tablet form of this medication.      Labs:  Results for orders placed or performed during the hospital encounter of 09/08/19 (from the past 48 hour(s))  Comprehensive metabolic panel     Status: Abnormal   Collection Time: 09/08/19  7:50 PM  Result Value Ref Range   Sodium 140 135 - 145 mmol/L   Potassium 3.8 3.5 - 5.1 mmol/L   Chloride 91 (L) 98 - 111 mmol/L   CO2 37 (H) 22 - 32 mmol/L   Glucose, Bld 159 (H) 70 - 99 mg/dL   BUN 17 8 - 23 mg/dL   Creatinine, Ser 1.09 0.61 - 1.24 mg/dL   Calcium 9.5 8.9 - 10.3 mg/dL   Total Protein 6.8 6.5 - 8.1 g/dL   Albumin 3.9 3.5 - 5.0 g/dL   AST 17 15 - 41 U/L   ALT 22 0 - 44 U/L   Alkaline Phosphatase 75 38 - 126 U/L   Total Bilirubin 0.7 0.3 - 1.2 mg/dL   GFR calc non Af Amer >60 >60 mL/min   GFR calc Af Amer >60 >60 mL/min   Anion gap 12 5 - 15    Comment: Performed at [facility], [place], [place] [address]  Troponin I (High Sensitivity)     Status: None   Collection Time: 09/08/19  7:50 PM  Result Value Ref Range   Troponin I (High Sensitivity) 10 <18 ng/L    Comment: (NOTE) Elevated high sensitivity troponin I (hsTnI) values and significant  changes across serial measurements may suggest ACS but many other  chronic and acute conditions are known to elevate hsTnI results.  Refer to the "Links" section for chest pain algorithms and additional  guidance. Performed at [facility], [address] [place], [place] [address]   Lactic acid, plasma     Status: None   Collection Time: 09/08/19  7:50 PM  Result Value Ref Range   Lactic Acid, Venous 1.1 0.5 - 1.9 mmol/L    Comment: Performed at [facility], [address] [place], [place] [address]  CBC with Differential     Status: Abnormal   Collection Time: 09/08/19  7:50 PM  Result Value Ref Range   WBC 7.2 4.0 - 10.5 K/uL   RBC  5.85 (H) 4.22 - 5.81 MIL/uL   Hemoglobin 15.6 13.0 - 17.0 g/dL   HCT 51.3 39.0 - 52.0 %   MCV 87.7 80.0 - 100.0 fL   MCH 26.7 26.0 - 34.0 pg   MCHC 30.4 30.0 - 36.0 g/dL   RDW 15.7 (H) 11.5 - 15.5 %   Platelets 177 150 - 400 K/uL   nRBC 0.0 0.0 - 0.2 %   Neutrophils Relative % 64 %   Neutro Abs 4.5 1.7 - 7.7 K/uL   Lymphocytes Relative 22 %   Lymphs Abs 1.6 0.7 - 4.0 K/uL   Monocytes Relative 9 %   Monocytes Absolute 0.6 0.1 - 1.0 K/uL   Eosinophils Relative 4 %   Eosinophils Absolute 0.3 0.0 - 0.5 K/uL   Basophils Relative 1 %   Basophils Absolute 0.1 0.0 - 0.1 K/uL   Immature Granulocytes 0 %   Abs Immature Granulocytes 0.03 0.00 - 0.07 K/uL    Comment: Performed at [facility], [address] [place], [place] [address]  Protime-INR     Status: None   Collection Time: 09/08/19  7:50 PM  Result Value Ref Range   Prothrombin Time 13.4 11.4 - 15.2 seconds   INR 1.0 0.8 - 1.2    Comment: (NOTE) INR goal varies based on device and disease states. Performed at [facility], [address] [place], [place] [address]   Lipase, blood     Status: None   Collection Time: 09/08/19  7:50 PM  Result Value Ref Range   Lipase 20 11 - 51 U/L    Comment: Performed at [facility], [address] [place], [place] [address]  Urinalysis, Complete w Microscopic     Status: Abnormal   Collection Time: 09/08/19  7:57 PM  Result Value Ref Range   Color, Urine STRAW (A) YELLOW   APPearance CLEAR (A) CLEAR   Specific Gravity, Urine 1.008 1.005 - 1.030   pH 5.0 5.0 - 8.0   Glucose, UA 50 (A) NEGATIVE mg/dL   Hgb urine dipstick SMALL (A) NEGATIVE   Bilirubin Urine NEGATIVE NEGATIVE   Ketones, ur NEGATIVE NEGATIVE mg/dL   Protein, ur NEGATIVE NEGATIVE mg/dL   Nitrite NEGATIVE NEGATIVE   Leukocytes,Ua NEGATIVE NEGATIVE   RBC / HPF 0-5 0 - 5 RBC/hpf   WBC, UA 0-5 0 - 5 WBC/hpf   Bacteria, UA NONE SEEN NONE SEEN   Squamous Epithelial / LPF NONE SEEN 0 - 5    Mucus PRESENT     Comment: Performed at [facility], [place], [place] [address]  Troponin I (High Sensitivity)     Status: None   Collection Time: 09/08/19  9:35 PM  Result Value Ref Range   Troponin I (High Sensitivity) 11 <18 ng/L    Comment: (NOTE) Elevated high sensitivity troponin I (hsTnI) values and significant  changes across serial measurements may suggest ACS but many other  chronic and acute conditions are known to elevate hsTnI results.  Refer to the "Links" section for chest pain algorithms and additional  guidance. Performed  at [facility], [place], [place] [address]   SARS CORONAVIRUS 2 (TAT 6-24 HRS) Nasopharyngeal Nasopharyngeal Swab     Status: None   Collection Time: 09/08/19 11:51 PM   Specimen: Nasopharyngeal Swab  Result Value Ref Range   SARS Coronavirus 2 NEGATIVE NEGATIVE    Comment: (NOTE) SARS-CoV-2 target nucleic acids are NOT DETECTED. The SARS-CoV-2 RNA is generally detectable in upper and lower respiratory specimens during the acute phase of infection. Negative results do not preclude SARS-CoV-2 infection, do not rule out co-infections with other pathogens, and should not be used as the sole basis for treatment or other patient management decisions. Negative results must be combined with clinical observations, patient history, and epidemiological information. The expected result is Negative. Fact Sheet for Patients: SugarRoll.be Fact Sheet for Healthcare Providers: https://www.woods-mathews.com/ This test is not yet approved or cleared by the [place] FDA and  has been authorized for detection and/or diagnosis of SARS-CoV-2 by FDA under an Emergency Use Authorization (EUA). This EUA will remain  in effect (meaning this test can be used) for the duration of the COVID-19 declaration under Section 56 4(b)(1) of the Act, 21 U.S.C. section  360bbb-3(b)(1), unless the authorization is terminated or revoked sooner. Performed at [facility], [place] [address] [place], [place] [address]   Glucose, capillary     Status: Abnormal   Collection Time: 09/09/19  2:12 AM  Result Value Ref Range   Glucose-Capillary 127 (H) 70 - 99 mg/dL  Lipid panel     Status: Abnormal   Collection Time: 09/09/19  4:40 AM  Result Value Ref Range   Cholesterol 110 0 - 200 mg/dL   Triglycerides 175 (H) <150 mg/dL   HDL 31 (L) >40 mg/dL   Total CHOL/HDL Ratio 3.5 RATIO   VLDL 35 0 - 40 mg/dL   LDL Cholesterol 44 0 - 99 mg/dL    Comment:        Total Cholesterol/HDL:CHD Risk Coronary Heart Disease Risk Table                     Men   Women  1/2 Average Risk   3.4   3.3  Average Risk       5.0   4.4  2 X Average Risk   9.6   7.1  3 X Average Risk  23.4   11.0        Use the calculated Patient Ratio above and the CHD Risk Table to determine the patient's CHD Risk.        ATP III CLASSIFICATION (LDL):  <100     mg/dL   Optimal  100-129  mg/dL   Near or Above                    Optimal  130-159  mg/dL   Borderline  160-189  mg/dL   High  >190     mg/dL   Very High Performed at [facility], [place], [place] [address]   Troponin I (High Sensitivity)     Status: None   Collection Time: 09/09/19  4:40 AM  Result Value Ref Range   Troponin I (High Sensitivity) 10 <18 ng/L    Comment: (NOTE) Elevated high sensitivity troponin I (hsTnI) values and significant  changes across serial measurements may suggest ACS but many other  chronic and acute conditions are known to elevate hsTnI results.  Refer to the "Links"  section for chest pain algorithms and additional  guidance. Performed at [facility], [address], [place], [place] [address]     Current Facility-Administered Medications  Medication Dose Route Frequency Provider Last Rate Last Dose  . 0.9 %  sodium chloride infusion   Intravenous  Continuous Mansy, Jan A, MD 100 mL/hr at 09/09/19 0252    . acetaminophen (TYLENOL) tablet 650 mg  650 mg Oral Q4H PRN Mansy, Jan A, MD      . albuterol (PROVENTIL) (2.5 MG/3ML) 0.083% nebulizer solution 2.5 mg  2.5 mg Inhalation QHS PRN Mansy, Jan A, MD      . allopurinol (ZYLOPRIM) tablet 300 mg  300 mg Oral BID Mansy, Jan A, MD   300 mg at 09/09/19 1101  . ALPRAZolam Duanne Moron) tablet 1 mg  1 mg Oral TID Mansy, Jan A, MD   1 mg at 09/09/19 1057  . apixaban (ELIQUIS) tablet 5 mg  5 mg Oral BID Mansy, Jan A, MD   5 mg at 09/09/19 1101  . atorvastatin (LIPITOR) tablet 80 mg  80 mg Oral QHS Mansy, Jan A, MD   80 mg at 09/09/19 0251  . budesonide (PULMICORT) nebulizer solution 0.5 mg  0.5 mg Nebulization BID Mansy, Jan A, MD   0.5 mg at 09/09/19 0830  . cholecalciferol (VITAMIN D3) tablet 5,000 Units  5,000 Units Oral Daily Mansy, Jan A, MD   5,000 Units at 09/09/19 1059  . citalopram (CELEXA) tablet 20 mg  20 mg Oral Daily Mansy, Jan A, MD   20 mg at 09/09/19 1058  . docusate sodium (COLACE) capsule 100 mg  100 mg Oral Daily Mansy, Jan A, MD   100 mg at 09/09/19 1059  . ipratropium-albuterol (DUONEB) 0.5-2.5 (3) MG/3ML nebulizer solution 3 mL  3 mL Nebulization Q6H PRN Mansy, Jan A, MD      . isosorbide mononitrate (IMDUR) 24 hr tablet 120 mg  120 mg Oral Daily Mansy, Jan A, MD   120 mg at 09/09/19 1059  . lidocaine (XYLOCAINE) 2 % viscous mouth solution 15 mL  15 mL Oral Once Mansy, Jan A, MD      . lisinopril (ZESTRIL) tablet 5 mg  5 mg Oral BH-q7a Mansy, Jan A, MD   5 mg at 09/09/19 1059  . loratadine (CLARITIN) tablet 10 mg  10 mg Oral Daily Mansy, Jan A, MD   10 mg at 09/09/19 1059  . magnesium oxide (MAG-OX) tablet 400 mg  400 mg Oral BH-q7a Mansy, Jan A, MD   400 mg at 09/09/19 0548  . morphine 2 MG/ML injection 2 mg  2 mg Intravenous Q2H PRN Mansy, Jan A, MD   2 mg at 09/09/19 1053  . nitroGLYCERIN (NITROSTAT) SL tablet 0.4 mg  0.4 mg Sublingual Q5 min PRN Mansy, Jan A, MD      . ondansetron  [facility]) injection 4 mg  4 mg Intravenous Q6H PRN Mansy, Jan A, MD      . oxyCODONE (Oxy IR/ROXICODONE) immediate release tablet 5 mg  5 mg Oral Q4H PRN Mansy, Jan A, MD   5 mg at 09/09/19 0054  . pantoprazole (PROTONIX) EC tablet 20 mg  20 mg Oral Daily Mansy, Jan A, MD   20 mg at 09/09/19 1059  . ranolazine (RANEXA) 12 hr tablet 1,000 mg  1,000 mg Oral BID Mansy, Jan A, MD   1,000 mg at 09/09/19 1059  . sotalol (BETAPACE) tablet 80 mg  80 mg Oral BID Mansy, Jan  A, MD   80 mg at 09/09/19 1058  . torsemide (DEMADEX) tablet 50 mg  50 mg Oral Daily Mansy, Jan A, MD   50 mg at 09/09/19 1059  . zolpidem (AMBIEN) tablet 5 mg  5 mg Oral QHS PRN Mansy, Arvella Merles, MD        Musculoskeletal: Strength & Muscle Tone: decreased Gait & Station: did not witness Patient leans: N/A  Psychiatric Specialty Exam: Physical Exam  Nursing note and vitals reviewed. Constitutional: He is oriented to person, place, and time. He appears well-developed and well-nourished.  HENT:  Head: Normocephalic.  Neck: Normal range of motion.  Respiratory: Effort normal.  Musculoskeletal: Normal range of motion.  Neurological: He is alert and oriented to person, place, and time.  Psychiatric: His speech is normal and behavior is normal. Judgment and thought content normal. His mood appears anxious. Cognition and memory are normal. He exhibits a depressed mood.    Review of Systems  Psychiatric/Behavioral: Positive for depression. The patient is nervous/anxious.   All other systems reviewed and are negative.   Blood pressure 140/84, pulse (!) 50, temperature 97.8 F (36.6 C), temperature source Oral, resp. rate 18, height 5' 7"  (1.702 m), weight 107.3 kg, SpO2 98 %.Body mass index is 37.04 kg/m.  General Appearance: Casual  Eye Contact:  Good  Speech:  Normal Rate  Volume:  Normal  Mood:  Anxious and Depressed  Affect:  Congruent  Thought Process:  Coherent and Descriptions of Associations: Intact  Orientation:  Full  (Time, Place, and Person)  Thought Content:  WDL and Logical  Suicidal Thoughts:  No  Homicidal Thoughts:  No  Memory:  Immediate;   Good Recent;   Good Remote;   Good  Judgement:  Fair  Insight:  Fair  Psychomotor Activity:  Normal  Concentration:  Concentration: Fair and Attention Span: Fair  Recall:  Good  Fund of Knowledge:  Good  Language:  Good  Akathisia:  No  Handed:  Right  AIMS (if indicated):     Assets:  Housing Leisure Time Resilience Social Support  ADL's:  Intact  Cognition:  WNL  Sleep:      63 year old male who was admitted for chest pain.  His wife of 62 years passed last month after falling and having a head injury, after she experienced a stroke in February.  He reports an increase in his depression since her death, currently managed by his PCP.  Takes Celexa 20 mg and Xanax 1 mg 3 times daily.  He has been on Xanax for at least 2 years.  Rates depression and anxiety at a high level today, denies suicidal/homicidal ideations and no past attempts.  No history or current use of substances, no hallucinations.  Appetite and sleep are decreased.  Discussed medications along with therapy after medical discharge.    Treatment Plan Summary: Bereavement, depression: -Continue Celexa 20 mg daily -Therapy recommended, resources placed in discharge instructions  Anxiety: -Recommend changing Xanax 1 mg TID to Klonopin 0.5 mg BID  Disposition: No evidence of imminent risk to self or others at present.    Waylan Boga, NP 09/09/2019 11:19 AM

## 2019-09-09 NOTE — ED Notes (Signed)
ED TO INPATIENT HANDOFF REPORT  ED Nurse Name and Phone #: [phone]   S Name/Age/Gender Lawrence [name] 63 y.o. male Room/Bed: ED11A/ED11A  Code Status   Code Status: Full Code  Home/SNF/Other Home Patient oriented to: self, place, time and situation Is this baseline? Yes   Triage Complete: Triage complete  Chief Complaint nose bleed, hypertension  Triage Note Patient arrived to ER from home by EMS. Patient was released from [facility] earlier today/ Once getting home was having high BP with diastolic 323. Patient called EMS. EMS report normal BP. Patient also states having nose bleed out of both nares.    Allergies Allergies  Allergen Reactions  . Demerol  [Meperidine Hcl]   . Demerol [Meperidine] Hives  . Jardiance [Empagliflozin] Other (See Comments)    Perineal pain  . Prednisone Other (See Comments) and Hypertension    Pt states that this medication puts him in A-fib   . Sulfa Antibiotics Hives  . Albuterol Sulfate [Albuterol] Palpitations and Other (See Comments)    Pt currently uses this medication.    . Morphine Sulfate Nausea And Vomiting, Rash and Other (See Comments)    Pt states that he is only allergic to the tablet form of this medication.      Level of Care/Admitting Diagnosis ED Disposition    ED Disposition Condition [facility] Area: [place] [[id number]]  Level of Care: Telemetry [5]  Covid Evaluation: Asymptomatic Screening Protocol (No Symptoms)  Diagnosis: Chest pain [557322]  Admitting Physician: Christel Mormon [[id number]]  Attending Physician: Christel Mormon [[id number]]  PT Class (Do Not Modify): Observation [104]  PT Acc Code (Do Not Modify): Observation [10022]       B Medical/Surgery History Past Medical History:  Diagnosis Date  . A-fib ([place])   . Anemia   . Anginal pain ([place])   . Anxiety   . Arthritis   . Asthma   . CAD (coronary artery disease)    a. 2002 CABGx2 (LIMA->LAD, VG->VG->OM1);   b. 09/2012 DES->OM;  c. 03/2015 PTCA of LAD [facility]) in setting of atretic LIMA; d. 05/2015 Cath [facility]): nonobs dzs; e. 06/2015 Cath ([facility]): LM nl, LAD 45p/d ISR, 50d, D1/2 small, LCX 50p/d ISR, OM1 70ost, 30 ISR, VG->OM1 50ost, 78m LIMA->LAD 99p/d - atretic, RCA dom, nl; f.cath 10/16: 40-50%(FFR 0.90) pLAD, 75% (FFR 0.77) mLAD s/p PCI/DES, oRCA 40% (FFR0.95)  . Cancer ([place]    SKIN CANCER ON BACK  . Celiac disease   . Chronic diastolic CHF (congestive heart failure) ([place]    a. 06/2009 Echo: EF 60-65%, Gr 1 DD, triv AI, mildly dil LA, nl RV.  .Marland KitchenCOPD (chronic obstructive pulmonary disease) ([place]    a. Chronic bronchitis and emphysema.  . DDD (degenerative disc disease), lumbar   . Diverticulosis   . Dysrhythmia   . Essential hypertension   . GERD (gastroesophageal reflux disease)   . History of hiatal hernia   . History of kidney stones    H/O  . History of tobacco abuse    a. Quit 2014.  .Marland KitchenMyocardial infarction ([place] 2002   4 STENTS  . Pancreatitis   . PSVT (paroxysmal supraventricular tachycardia) ([place]    a. 10/2012 Noted on Zio Patch.  . Sleep apnea    LOST CORD TO CPAP -ONLY 02 @ BEDTIME  . Tubular adenoma of colon   . Type II diabetes mellitus ([place]    Past Surgical History:  Procedure Laterality  Date  . BYPASS GRAFT    . CARDIAC CATHETERIZATION N/A 07/12/2015   rocedure: Left Heart Cath and Cors/Grafts Angiography;  Surgeon: Belva Crome, MD;  Location: [place] CV LAB;  Service: Cardiovascular;  Laterality: N/A;  . CARDIAC CATHETERIZATION Right 10/07/2015   Procedure: Left Heart Cath and Cors/Grafts Angiography;  Surgeon: Dionisio David, MD;  Location: [place] CV LAB;  Service: Cardiovascular;  Laterality: Right;  . CARDIAC CATHETERIZATION N/A 04/06/2016   Procedure: Left Heart Cath and Coronary Angiography;  Surgeon: Yolonda Kida, MD;  Location: [place] CV LAB;  Service: Cardiovascular;  Laterality: N/A;  . CARDIAC CATHETERIZATION  04/06/2016   Procedure:  Bypass Graft Angiography;  Surgeon: Yolonda Kida, MD;  Location: [place] CV LAB;  Service: Cardiovascular;;  . CARDIAC CATHETERIZATION N/A 11/02/2016   Procedure: Left Heart Cath and Cors/Grafts Angiography and possible PCI;  Surgeon: Yolonda Kida, MD;  Location: [place] CV LAB;  Service: Cardiovascular;  Laterality: N/A;  . CARDIAC CATHETERIZATION N/A 11/02/2016   Procedure: Coronary Stent Intervention;  Surgeon: Yolonda Kida, MD;  Location: [place] CV LAB;  Service: Cardiovascular;  Laterality: N/A;  . CHOLECYSTECTOMY    . CIRCUMCISION N/A 06/09/2019   Procedure: CIRCUMCISION ADULT;  Surgeon: Billey Co, MD;  Location: [facility] ORS;  Service: Urology;  Laterality: N/A;  . COLONOSCOPY WITH PROPOFOL N/A 04/01/2018   Procedure: COLONOSCOPY WITH PROPOFOL;  Surgeon: Manya Silvas, MD;  Location: [facility] ENDOSCOPY;  Service: Endoscopy;  Laterality: N/A;  . ESOPHAGEAL DILATION    . ESOPHAGOGASTRODUODENOSCOPY (EGD) WITH PROPOFOL N/A 04/01/2018   Procedure: ESOPHAGOGASTRODUODENOSCOPY (EGD) WITH PROPOFOL;  Surgeon: Manya Silvas, MD;  [facility] END[facility];  Service: Endoscopy;  Laterality: N/A;  . LEFT HEART CATH AND CORS/GRAFTS ANGIOGRAPHY N/A 06/12/2019   Procedure: LEFT HEART CATH AND CORS/GRAFTS ANGIOGRAPHY;  Surgeon: Teodoro Spray, MD;  Location: [place] CV LAB;  Service: Cardiovascular;  Laterality: N/A;  . TONSILLECTOMY    . VASCULAR SURGERY       A IV Location/Drains/Wounds Patient Lines/Drains/Airways Status   Active Line/Drains/Airways    Name:   Placement date:   Placement time:   Site:   Days:   Peripheral IV 09/08/19 Right Antecubital   09/08/19    2011    Antecubital   1   Incision (Closed) 06/10/19 Penis   06/10/19    0001     91          Intake/Output Last 24 hours No intake or output data in the 24 hours ending 09/09/19 0014  Labs/Imaging Results for orders placed or performed during the hospital encounter of 09/08/19 (from  the past 48 hour(s))  Comprehensive metabolic panel     Status: Abnormal   Collection Time: 09/08/19  7:50 PM  Result Value Ref Range   Sodium 140 135 - 145 mmol/L   Potassium 3.8 3.5 - 5.1 mmol/L   Chloride 91 (L) 98 - 111 mmol/L   CO2 37 (H) 22 - 32 mmol/L   Glucose, Bld 159 (H) 70 - 99 mg/dL   BUN 17 8 - 23 mg/dL   Creatinine, Ser 1.09 0.61 - 1.24 mg/dL   Calcium 9.5 8.9 - 10.3 mg/dL   Total Protein 6.8 6.5 - 8.1 g/dL   Albumin 3.9 3.5 - 5.0 g/dL   AST 17 15 - 41 U/L   ALT 22 0 - 44 U/L   Alkaline Phosphatase 75 38 - 126 U/L   Total Bilirubin 0.7  0.3 - 1.2 mg/dL   GFR calc non Af Amer >60 >60 mL/min   GFR calc Af Amer >60 >60 mL/min   Anion gap 12 5 - 15    Comment: Performed at [facility], [place], [place] [address]  Troponin I (High Sensitivity)     Status: None   Collection Time: 09/08/19  7:50 PM  Result Value Ref Range   Troponin I (High Sensitivity) 10 <18 ng/L    Comment: (NOTE) Elevated high sensitivity troponin I (hsTnI) values and significant  changes across serial measurements may suggest ACS but many other  chronic and acute conditions are known to elevate hsTnI results.  Refer to the "Links" section for chest pain algorithms and additional  guidance. Performed at [facility], [address] [place], [place] [address]   Lactic acid, plasma     Status: None   Collection Time: 09/08/19  7:50 PM  Result Value Ref Range   Lactic Acid, Venous 1.1 0.5 - 1.9 mmol/L    Comment: Performed at [facility], [address] [place], [place] [address]  CBC with Differential     Status: Abnormal   Collection Time: 09/08/19  7:50 PM  Result Value Ref Range   WBC 7.2 4.0 - 10.5 K/uL   RBC 5.85 (H) 4.22 - 5.81 MIL/uL   Hemoglobin 15.6 13.0 - 17.0 g/dL   HCT 51.3 39.0 - 52.0 %   MCV 87.7 80.0 - 100.0 fL   MCH 26.7 26.0 - 34.0 pg   MCHC 30.4 30.0 - 36.0 g/dL   RDW 15.7 (H) 11.5 - 15.5 %   Platelets 177 150 - 400 K/uL    nRBC 0.0 0.0 - 0.2 %   Neutrophils Relative % 64 %   Neutro Abs 4.5 1.7 - 7.7 K/uL   Lymphocytes Relative 22 %   Lymphs Abs 1.6 0.7 - 4.0 K/uL   Monocytes Relative 9 %   Monocytes Absolute 0.6 0.1 - 1.0 K/uL   Eosinophils Relative 4 %   Eosinophils Absolute 0.3 0.0 - 0.5 K/uL   Basophils Relative 1 %   Basophils Absolute 0.1 0.0 - 0.1 K/uL   Immature Granulocytes 0 %   Abs Immature Granulocytes 0.03 0.00 - 0.07 K/uL    Comment: Performed at [facility], [address] [place], [place] [address]  Protime-INR     Status: None   Collection Time: 09/08/19  7:50 PM  Result Value Ref Range   Prothrombin Time 13.4 11.4 - 15.2 seconds   INR 1.0 0.8 - 1.2    Comment: (NOTE) INR goal varies based on device and disease states. Performed at [facility], [address] [place], [place] [address]   Lipase, blood     Status: None   Collection Time: 09/08/19  7:50 PM  Result Value Ref Range   Lipase 20 11 - 51 U/L    Comment: Performed at [facility], [address] [place], [place] [address]  Urinalysis, Complete w Microscopic     Status: Abnormal   Collection Time: 09/08/19  7:57 PM  Result Value Ref Range   Color, Urine STRAW (A) YELLOW   APPearance CLEAR (A) CLEAR   Specific Gravity, Urine 1.008 1.005 - 1.030   pH 5.0 5.0 - 8.0   Glucose, UA 50 (A) NEGATIVE mg/dL   Hgb urine dipstick SMALL (A) NEGATIVE   Bilirubin Urine NEGATIVE NEGATIVE   Ketones, ur NEGATIVE NEGATIVE mg/dL  Protein, ur NEGATIVE NEGATIVE mg/dL   Nitrite NEGATIVE NEGATIVE   Leukocytes,Ua NEGATIVE NEGATIVE   RBC / HPF 0-5 0 - 5 RBC/hpf   WBC, UA 0-5 0 - 5 WBC/hpf   Bacteria, UA NONE SEEN NONE SEEN   Squamous Epithelial / LPF NONE SEEN 0 - 5   Mucus PRESENT     Comment: Performed at [facility], [place], [place] [address]  Troponin I (High Sensitivity)     Status: None   Collection Time: 09/08/19  9:35 PM  Result Value Ref Range   Troponin I (High  Sensitivity) 11 <18 ng/L    Comment: (NOTE) Elevated high sensitivity troponin I (hsTnI) values and significant  changes across serial measurements may suggest ACS but many other  chronic and acute conditions are known to elevate hsTnI results.  Refer to the "Links" section for chest pain algorithms and additional  guidance. Performed at [facility], [address] [place], [place] [address]    Dg Chest Portable 1 View  Result Date: 09/08/2019 CLINICAL DATA:  Chest pain EXAM: PORTABLE CHEST 1 VIEW COMPARISON:  08/26/2019 FINDINGS: Cardiomegaly status post median sternotomy. Both lungs are clear. The visualized skeletal structures are unremarkable. IMPRESSION: Cardiomegaly without acute abnormality of the lungs in AP portable projection. Electronically Signed   By: Eddie Candle M.D.   On: 09/08/2019 19:52    Pending Labs Unresulted Labs (From admission, onward)    Start     Ordered   09/09/19 0500  Lipid panel  Once,   STAT     09/09/19 0005   09/08/19 2350  SARS CORONAVIRUS 2 (TAT 6-24 HRS) Nasopharyngeal Nasopharyngeal Swab  (Asymptomatic/Tier 2 Patients Labs)  Once,   STAT    Question Answer Comment  Is this test for diagnosis or screening Screening   Symptomatic for COVID-19 as defined by CDC No   Hospitalized for COVID-19 No   Admitted to ICU for COVID-19 No   Previously tested for COVID-19 Yes   Resident in a congregate (group) care setting No   Employed in healthcare setting No      09/08/19 2349          Vitals/Pain Today's Vitals   09/08/19 2215 09/08/19 2230 09/08/19 2300 09/08/19 2315  BP:  111/60 110/70   Pulse: (!) 58  60 (!) 58  Resp: 14 11 11 12   Temp:      TempSrc:      SpO2: 98%  96% 96%  Weight:      Height:      PainSc:        Isolation Precautions No active isolations  Medications Medications  allopurinol (ZYLOPRIM) tablet 300 mg (has no administration in time range)  lisinopril (ZESTRIL) tablet 5 mg (has no administration in  time range)  atorvastatin (LIPITOR) tablet 80 mg (has no administration in time range)  isosorbide mononitrate (IMDUR) 24 hr tablet 120 mg (has no administration in time range)  nitroGLYCERIN (NITROSTAT) SL tablet 0.4 mg (has no administration in time range)  ranolazine (RANEXA) 12 hr tablet 1,000 mg (has no administration in time range)  sotalol (BETAPACE) tablet 80 mg (has no administration in time range)  torsemide (DEMADEX) tablet 50 mg (has no administration in time range)  ALPRAZolam (XANAX) tablet 1 mg (has no administration in time range)  citalopram (CELEXA) tablet 20 mg (has no administration in time range)  docusate sodium (COLACE) capsule 100 mg (has no administration in time range)  linaclotide Rolan Lipa)  capsule 72 mcg (has no administration in time range)  pantoprazole (PROTONIX) EC tablet 40 mg (has no administration in time range)  apixaban (ELIQUIS) tablet 5 mg (has no administration in time range)  pantoprazole (PROTONIX) EC tablet 20 mg (has no administration in time range)  Vitamin D3 CAPS 5,000 Units (has no administration in time range)  magnesium oxide (MAG-OX) tablet 400 mg (has no administration in time range)  Albuterol Sulfate (sensor) AEPB 2 puff (has no administration in time range)  budesonide (PULMICORT) nebulizer solution 0.5 mg (has no administration in time range)  loratadine (CLARITIN) tablet 10 mg (has no administration in time range)  ipratropium-albuterol (DUONEB) 0.5-2.5 (3) MG/3ML nebulizer solution 3 mL (has no administration in time range)  tiotropium (SPIRIVA) inhalation capsule ([facility] use ONLY) 18 mcg (has no administration in time range)  acetaminophen (TYLENOL) tablet 650 mg (has no administration in time range)  ondansetron (ZOFRAN) injection 4 mg (has no administration in time range)  enoxaparin (LOVENOX) injection 40 mg (has no administration in time range)  0.9 %  sodium chloride infusion (has no administration in time range)  alum & mag  hydroxide-simeth (MAALOX/MYLANTA) 200-200-20 MG/5ML suspension 30 mL (has no administration in time range)    And  lidocaine (XYLOCAINE) 2 % viscous mouth solution 15 mL (has no administration in time range)  zolpidem (AMBIEN) tablet 5 mg (has no administration in time range)  oxyCODONE-acetaminophen (PERCOCET/ROXICET) 5-325 MG per tablet 1 tablet (has no administration in time range)    And  oxyCODONE (Oxy IR/ROXICODONE) immediate release tablet 5 mg (has no administration in time range)  ondansetron (ZOFRAN) injection 4 mg (4 mg Intravenous Given 09/08/19 2042)  morphine 4 MG/ML injection 4 mg (4 mg Intravenous Given 09/08/19 2042)  metoCLOPramide (REGLAN) injection 10 mg (10 mg Intravenous Given 09/08/19 2226)  morphine 4 MG/ML injection 4 mg (4 mg Intravenous Given 09/08/19 2226)    Mobility walks High fall risk   Focused Assessments Cardiac Assessment Handoff:    Lab Results  Component Value Date   CKTOTAL 93 05/23/2014   CKMB 1.7 05/23/2014   TROPONINI <0.03 12/15/2018   No results found for: DDIMER Does the Patient currently have chest pain? Yes     R Recommendations: See Admitting Provider Note  Report given to:   Additional Notes:

## 2019-09-09 NOTE — Care Management Obs Status (Signed)
[place] NOTIFICATION   Patient Details  Name: ISEAH PLOUFF MRN: [id number] Date of Birth: [birth year]   Medicare Observation Status Notification Given:  Yes    Jaida Basurto A Dakia Schifano, RN 09/09/2019, 10:07 AM

## 2019-09-09 NOTE — Consult Note (Signed)
[facility] Consultation Note  Patient ID: Lawrence [name], MRN: [id number], DOB/AGE: 1956/02/22 63 y.o. Admit date: 09/08/2019   Date of Consult: 09/09/2019 Primary Physician: Birdie Sons, MD Primary Cardiologist: [name]  Chief Complaint:  Chief Complaint  Patient presents with  . Epistaxis  . Chest Pain   Reason for Consult: Chest pain  HPI: 63 y.o. male with known coronary artery disease status post coronary bypass graft and previous stents as well as myocardial infarction who has had intermittent episodes of chest discomfort and severe lung disease.  The patient has been placed on appropriate medication management including high intensity cholesterol therapy hypertension control and isosorbide Ranexa combinations for his intermittent episodes of chest discomfort.  The patient did have a cardiac catheterization several months prior showing stable bypass surgery stenting and no need for further intervention.  Additionally echocardiogram showed mild apical LV dysfunction but overall ejection fraction of 50% with no safety concerns of progression of heart failure.  The patient has had paroxysmal nonvalvular atrial fibrillation currently remaining in normal rhythm on anticoagulation with no current evidence episodes.  The patient has had continued episode of chest discomfort but this is atypical in nature and more constant.  EKG today has shown sinus bradycardia with lateral T wave and ST changes unchanged from last visit.  Chest x-ray was normal without evidence of pulmonary edema troponin is 11 more consistent with demand ischemia rather than acute coronary syndrome.  This morning the patient feels well  Past Medical History:  Diagnosis Date  . A-fib ([place])   . Anemia   . Anginal pain ([place])   . Anxiety   . Arthritis   . Asthma   . CAD (coronary artery disease)    a. 2002 CABGx2 (LIMA->LAD, VG->VG->OM1);  b. 09/2012 DES->OM;  c. 03/2015 PTCA of LAD [facility]) in setting of  atretic LIMA; d. 05/2015 Cath [facility]): nonobs dzs; e. 06/2015 Cath ([facility]): LM nl, LAD 45p/d ISR, 50d, D1/2 small, LCX 50p/d ISR, OM1 70ost, 30 ISR, VG->OM1 50ost, 75m LIMA->LAD 99p/d - atretic, RCA dom, nl; f.cath 10/16: 40-50%(FFR 0.90) pLAD, 75% (FFR 0.77) mLAD s/p PCI/DES, oRCA 40% (FFR0.95)  . Cancer ([place]    SKIN CANCER ON BACK  . Celiac disease   . Chronic diastolic CHF (congestive heart failure) ([place]    a. 06/2009 Echo: EF 60-65%, Gr 1 DD, triv AI, mildly dil LA, nl RV.  .Marland KitchenCOPD (chronic obstructive pulmonary disease) ([place]    a. Chronic bronchitis and emphysema.  . DDD (degenerative disc disease), lumbar   . Diverticulosis   . Dysrhythmia   . Essential hypertension   . GERD (gastroesophageal reflux disease)   . History of hiatal hernia   . History of kidney stones    H/O  . History of tobacco abuse    a. Quit 2014.  .Marland KitchenMyocardial infarction ([place] 2002   4 STENTS  . Pancreatitis   . PSVT (paroxysmal supraventricular tachycardia) ([place]    a. 10/2012 Noted on Zio Patch.  . Sleep apnea    LOST CORD TO CPAP -ONLY 02 @ BEDTIME  . Tubular adenoma of colon   . Type II diabetes mellitus ([place]       Surgical History:  Past Surgical History:  Procedure Laterality Date  . BYPASS GRAFT    . CARDIAC CATHETERIZATION N/A 07/12/2015   rocedure: Left Heart Cath and Cors/Grafts Angiography;  Surgeon: HBelva Crome MD;  [facility];  Service: Cardiovascular;  Laterality: N/A;  .  CARDIAC CATHETERIZATION Right 10/07/2015   Procedure: Left Heart Cath and Cors/Grafts Angiography;  Surgeon: Dionisio David, MD;  Location: [place] [facility];  Service: Cardiovascular;  Laterality: Right;  . CARDIAC CATHETERIZATION N/A 04/06/2016   Procedure: Left Heart Cath and Coronary Angiography;  Surgeon: Yolonda Kida, MD;  Location: [place] [facility];  Service: Cardiovascular;  Laterality: N/A;  . CARDIAC CATHETERIZATION  04/06/2016   Procedure: Bypass Graft Angiography;  Surgeon: Yolonda Kida, MD;  Location: [place] [facility] LAB;  Service: Cardiovascular;;  . CARDIAC CATHETERIZATION N/A 11/02/2016   Procedure: Left Heart Cath and Cors/Grafts Angiography and possible PCI;  Surgeon: Yolonda Kida, MD;  Location: [place] [facility];  Service: Cardiovascular;  Laterality: N/A;  . CARDIAC CATHETERIZATION N/A 11/02/2016   Procedure: Coronary Stent Intervention;  Surgeon: Yolonda Kida, MD;  Location: [place] [facility];  Service: Cardiovascular;  Laterality: N/A;  . CHOLECYSTECTOMY    . CIRCUMCISION N/A 06/09/2019   Procedure: CIRCUMCISION ADULT;  Surgeon: Billey Co, MD;  Location: [facility] ORS;  Service: Urology;  Laterality: N/A;  . COLONOSCOPY WITH PROPOFOL N/A 04/01/2018   Procedure: COLONOSCOPY WITH PROPOFOL;  Surgeon: Manya Silvas, MD;  Location: [facility] ENDOSCOPY;  Service: Endoscopy;  Laterality: N/A;  . ESOPHAGEAL DILATION    . ESOPHAGOGASTRODUODENOSCOPY (EGD) WITH PROPOFOL N/A 04/01/2018   Procedure: ESOPHAGOGASTRODUODENOSCOPY (EGD) WITH PROPOFOL;  Surgeon: Manya Silvas, MD;  Location: [facility] ENDOSCOPY;  Service: Endoscopy;  Laterality: N/A;  . LEFT HEART CATH AND CORS/GRAFTS ANGIOGRAPHY N/A 06/12/2019   Procedure: LEFT HEART CATH AND CORS/GRAFTS ANGIOGRAPHY;  Surgeon: Teodoro Spray, MD;  Location: [place] [facility];  Service: Cardiovascular;  Laterality: N/A;  . TONSILLECTOMY    . VASCULAR SURGERY       Home Meds: Prior to Admission medications   Medication Sig Start Date End Date Taking? Authorizing Provider  allopurinol (ZYLOPRIM) 300 MG tablet Take 1 tablet (300 mg total) by mouth 2 (two) times daily. 10/05/18  Yes Birdie Sons, MD  ALPRAZolam Duanne Moron) 1 MG tablet Take 1 tablet (1 mg total) by mouth 3 (three) times daily. 06/23/19  Yes Birdie Sons, MD  arformoterol (BROVANA) 15 MCG/2ML NEBU Take 15 mcg by nebulization 2 (two) times daily.   Yes [provider]  atorvastatin (LIPITOR) 80 MG tablet TAKE ONE TABLET BY MOUTH AT  BEDTIME. Patient taking differently: Take 80 mg by mouth at bedtime.  09/19/18  Yes Birdie Sons, MD  Blood Glucose Monitoring Suppl (ACCU-CHEK AVIVA PLUS) w/Device KIT CHECK BLOOD SUGAR EVERY DAY 12/08/17  Yes Birdie Sons, MD  budesonide (PULMICORT) 0.5 MG/2ML nebulizer solution Take 0.5 mg by nebulization 2 (two) times daily.   Yes [provider]  cetirizine (ZYRTEC) 10 MG tablet TAKE ONE TABLET BY MOUTH AT BEDTIME 08/01/19  Yes Birdie Sons, MD  citalopram (CELEXA) 20 MG tablet Take 20 mg by mouth daily.   Yes [provider]  ELIQUIS 5 MG TABS tablet TAKE 1 TABLET BY MOUTH TWICE A DAY 08/10/19  Yes Birdie Sons, MD  glucose blood (ACCU-CHEK AVIVA PLUS) test strip Use to check blood sugar once a day 02/14/18  Yes [name], [name], MD  insulin lispro (HUMALOG) 100 UNIT/ML injection Inject 0-12 Units into the skin 3 (three) times daily before meals. As directed   Yes [provider]  ipratropium-albuterol (DUONEB) 0.5-2.5 (3) MG/3ML SOLN Take 3 mLs by nebulization every 6 (six) hours as needed.  Yes [provider]  isosorbide mononitrate (IMDUR) 120 MG 24 hr tablet Take 1 tablet (120 mg total) by mouth daily. 08/10/19  Yes Birdie Sons, MD  lisinopril (PRINIVIL,ZESTRIL) 2.5 MG tablet TAKE 1 TABLET BY MOUTH DAILY Patient taking differently: Take 5 mg by mouth every morning.  10/20/18  Yes Birdie Sons, MD  magnesium oxide (MAG-OX) 400 (241.3 Mg) MG tablet Take 1 tablet (400 mg total) by mouth every morning. 06/13/19  Yes Hackney, Tina A, FNP  oxyCODONE-acetaminophen (PERCOCET) 10-325 MG tablet Take 1 tablet by mouth every 4 (four) hours. 08/18/19  Yes Birdie Sons, MD  OXYGEN Inhale 2 L into the lungs continuous.    Yes [provider]  pantoprazole (PROTONIX) 20 MG tablet Take 20 mg by mouth daily.   Yes [provider]  ranolazine (RANEXA) 500 MG 12 hr tablet Take 1 tablet (500 mg total) by mouth 2 (two) times  daily. Patient taking differently: Take 1,000 mg by mouth 2 (two) times daily.  06/12/19  Yes Dustin Flock, MD  sotalol (BETAPACE) 80 MG tablet Take 80 mg by mouth 2 (two) times daily.   Yes [provider]  torsemide (DEMADEX) 100 MG tablet Take 50 mg by mouth daily.  10/11/17  Yes [provider]  Albuterol Sulfate, sensor, 108 (90 Base) MCG/ACT AEPB Inhale 2 puffs into the lungs at bedtime. AND PRN    [provider]  aspirin 81 MG chewable tablet Chew 81 mg by mouth daily.    [provider]  [place] Patter 100-25 MCG/INH AEPB INHALE 1 PUFF INTO THE LUNGS DAILY Patient not taking: Reported on 09/08/2019 06/13/19   Kendell Bane, NP  Cholecalciferol (VITAMIN D3) 125 MCG (5000 UT) CAPS Take 5,000 Units by mouth daily.    [provider]  dapagliflozin propanediol (FARXIGA) 10 MG TABS tablet Take 10 mg by mouth daily. Patient not taking: Reported on 09/08/2019 05/29/19   Birdie Sons, MD  docusate sodium (COLACE) 100 MG capsule Take 100 mg by mouth daily.     [provider]  LINZESS 72 MCG capsule Take 72 mcg by mouth daily before breakfast.     [provider]  methocarbamol (ROBAXIN) 500 MG tablet Take 1 tablet (500 mg total) by mouth 4 (four) times daily. Patient not taking: Reported on 09/08/2019 05/29/19   Birdie Sons, MD  nitroGLYCERIN (NITROSTAT) 0.4 MG SL tablet Place 1 tablet (0.4 mg total) under the tongue every 5 (five) minutes as needed for chest pain. Reported on 05/26/2016 11/28/18   Birdie Sons, MD  nystatin-triamcinolone (MYCOLOG II) cream Apply 1 application topically 2 (two) times daily. Patient not taking: Reported on 09/08/2019 06/29/19   Billey Co, MD  omega-3 acid ethyl esters (LOVAZA) 1 g capsule Take 4 capsules (4 g total) by mouth daily. Patient not taking: Reported on 09/08/2019 06/26/19   Birdie Sons, MD  omeprazole (PRILOSEC) 40 MG capsule Take 40 mg by mouth 2 (two) times a day.   04/24/19   [provider]  tiotropium (SPIRIVA HANDIHALER) 18 MCG inhalation capsule Place 1 capsule (18 mcg total) into inhaler and inhale daily. Patient not taking: Reported on 09/08/2019 02/20/19   Birdie Sons, MD  traZODone (DESYREL) 100 MG tablet TAKE 1 TABLET BY MOUTH AT BEDTIME FOR SLEEP Patient not taking: Reported on 09/08/2019 08/28/19   Birdie Sons, MD  venlafaxine XR (EFFEXOR-XR) 75 MG 24 hr capsule Take 1 capsule (75 mg total)  by mouth daily with breakfast. Patient not taking: Reported on 09/08/2019 04/04/19   Birdie Sons, MD    Inpatient Medications:  . allopurinol  300 mg Oral BID  . ALPRAZolam  1 mg Oral TID  . apixaban  5 mg Oral BID  . atorvastatin  80 mg Oral QHS  . budesonide  0.5 mg Nebulization BID  . cholecalciferol  5,000 Units Oral Daily  . citalopram  20 mg Oral Daily  . docusate sodium  100 mg Oral Daily  . isosorbide mononitrate  120 mg Oral Daily  . lidocaine  15 mL Oral Once  . lisinopril  5 mg Oral BH-q7a  . loratadine  10 mg Oral Daily  . magnesium oxide  400 mg Oral BH-q7a  . pantoprazole  20 mg Oral Daily  . ranolazine  1,000 mg Oral BID  . sotalol  80 mg Oral BID  . torsemide  50 mg Oral Daily   . sodium chloride 100 mL/hr at 09/09/19 0252    Allergies:  Allergies  Allergen Reactions  . Demerol  [Meperidine Hcl]   . Demerol [Meperidine] Hives  . Jardiance [Empagliflozin] Other (See Comments)    Perineal pain  . Prednisone Other (See Comments) and Hypertension    Pt states that this medication puts him in A-fib   . Sulfa Antibiotics Hives  . Albuterol Sulfate [Albuterol] Palpitations and Other (See Comments)    Pt currently uses this medication.    . Morphine Sulfate Nausea And Vomiting, Rash and Other (See Comments)    Pt states that he is only allergic to the tablet form of this medication.      Social History   Socioeconomic History  . Marital status: Widowed    Spouse name: Not on file  . Number of children:  1  . Years of education: Not on file  . Highest education level: 10th grade  Occupational History  . Occupation: Disabled  Social Needs  . Financial resource strain: Not hard at all  . Food insecurity    Worry: Never true    Inability: Never true  . Transportation needs    Medical: No    Non-medical: No  Tobacco Use  . Smoking status: Former Smoker    Packs/day: 3.00    Years: 50.00    Pack years: 150.00    Types: Cigarettes    Quit date: 04/22/2013    Years since quitting: 6.3  . Smokeless tobacco: Never Used  Substance and Sexual Activity  . Alcohol use: No    Comment: remotely quit alcohol use. Hx of heavy alcohol use.  . Drug use: No  . Sexual activity: Not on file  Lifestyle  . Physical activity    Days per week: Not on file    Minutes per session: Not on file  . Stress: Only a little  Relationships  . Social Herbalist on phone: Not on file    Gets together: Not on file    Attends religious service: Not on file    Active member of club or organization: Not on file    Attends meetings of clubs or organizations: Not on file    Relationship status: Not on file  . Intimate partner violence    Fear of current or ex partner: Not on file    Emotionally abused: Not on file    Physically abused: Not on file    Forced sexual activity: Not on file  Other Topics Concern  .  Not on file  Social History Narrative   Pt lives in [place] with wife.  Does not routinely exercise.     Family History  Problem Relation Age of Onset  . Heart attack Mother   . Depression Mother   . Heart disease Mother   . COPD Mother   . Hypertension Mother   . Heart attack Father   . Diabetes Father   . Depression Father   . Heart disease Father   . Cirrhosis Father   . Parkinson's disease Brother      Review of Systems Positive for chest pain Negative for: General:  chills, fever, night sweats or weight changes.  Cardiovascular: PND orthopnea syncope dizziness   Dermatological skin lesions rashes Respiratory: Cough congestion Urologic: Frequent urination urination at night and hematuria Abdominal: negative for nausea, vomiting, diarrhea, bright red blood per rectum, melena, or hematemesis Neurologic: negative for visual changes, and/or hearing changes  All other systems reviewed and are otherwise negative except as noted above.  Labs: No results for input(s): CKTOTAL, CKMB, TROPONINI in the last 72 hours. Lab Results  Component Value Date   WBC 7.2 09/08/2019   HGB 15.6 09/08/2019   HCT 51.3 09/08/2019   MCV 87.7 09/08/2019   PLT 177 09/08/2019    Recent Labs  Lab 09/08/19 1950  NA 140  K 3.8  CL 91*  CO2 37*  BUN 17  CREATININE 1.09  CALCIUM 9.5  PROT 6.8  BILITOT 0.7  ALKPHOS 75  ALT 22  AST 17  GLUCOSE 159*   Lab Results  Component Value Date   CHOL 110 09/09/2019   HDL 31 (L) 09/09/2019   LDLCALC 44 09/09/2019   TRIG 175 (H) 09/09/2019   No results found for: DDIMER  Radiology/Studies:  Dg Chest 2 View  Result Date: 08/26/2019 CLINICAL DATA:  Pt here for abscess to right forearm. Just distal of elbow is red swollen area that is warm to the touch. Pt also c/o fluttering/funny feeling in left chest that has been there for 3 days. No SHOB or cough. EXAM: CHEST - 2 VIEW COMPARISON:  Chest radiograph 06/10/2019 FINDINGS: Stable cardiomediastinal contours status post median sternotomy. Mild central vascular congestion. Chronic diffuse interstitial coarsening. Minimal scattered opacities likely atelectasis or scarring. No new focal infiltrate. No pneumothorax or pleural effusion. No acute finding in the visualized skeleton. IMPRESSION: No evidence of active disease in the chest. Chronic bronchitic changes and scattered atelectasis. Electronically Signed   By: Audie Pinto M.D.   On: 08/26/2019 15:57   Dg Chest Portable 1 View  Result Date: 09/08/2019 CLINICAL DATA:  Chest pain EXAM: PORTABLE CHEST 1 VIEW COMPARISON:   08/26/2019 FINDINGS: Cardiomegaly status post median sternotomy. Both lungs are clear. The visualized skeletal structures are unremarkable. IMPRESSION: Cardiomegaly without acute abnormality of the lungs in AP portable projection. Electronically Signed   By: Eddie Candle M.D.   On: 09/08/2019 19:52   Ct Angio Chest/abd/pel For Dissection W And/or Wo Contrast  Result Date: 09/04/2019 CLINICAL DATA:  Acute chest pain EXAM: CT ANGIOGRAPHY CHEST, ABDOMEN AND PELVIS TECHNIQUE: Multidetector CT imaging through the chest, abdomen and pelvis was performed using the standard protocol during bolus administration of intravenous contrast. Multiplanar reconstructed images and MIPs were obtained and reviewed to evaluate the vascular anatomy. CONTRAST:  148m OMNIPAQUE IOHEXOL 350 MG/ML SOLN COMPARISON:  05/27/2018 FINDINGS: CTA CHEST FINDINGS Cardiovascular: Initial noncontrast chest imaging demonstrates no hyperdense intramural hematoma. Thoracic aortic atherosclerosis noted. Postcontrast the thoracic aorta is intact  with wall calcification and mild tortuosity. Negative for dissection, aneurysm or acute vascular finding. No mediastinal hemorrhage or hematoma. Previous coronary bypass changes noted. Native coronary atherosclerosis present. Normal heart size. No pericardial effusion. Central pulmonary arteries are patent. No significant pulmonary embolus by CTA. Mediastinum/Nodes: No enlarged mediastinal, hilar, or axillary lymph nodes. Thyroid gland, trachea, and esophagus demonstrate no significant findings. Lungs/Pleura: Minor basilar atelectasis versus scarring. No acute airspace process, pneumonia, significant collapse or consolidation. Negative for edema or interstitial process. No pleural abnormality, effusion or pneumothorax. Trachea central airways are patent. Musculoskeletal: No chest wall abnormality. No acute or significant osseous findings. Review of the MIP images confirms the above findings. CTA ABDOMEN AND  PELVIS FINDINGS VASCULAR Aorta: Intact abdominal aorta with atherosclerotic changes noted. No acute vascular finding, aneurysm, dissection or retroperitoneal hemorrhage. No evidence of rupture or occlusive disease. Celiac: Widely patent including its branches SMA: Widely patent including its branches Renals: Widely patent main renal arteries. No accessory renal artery demonstrated. IMA: Remains patent off the distal aorta including its branches Inflow: Diffuse pelvic iliac atherosclerosis without occlusive disease. The common, internal and external iliac arteries are patent. No iliac aneurysm, dissection or acute vascular finding. Veins: Dedicated venous phase imaging not performed. Review of the MIP images confirms the above findings. NON-VASCULAR Hepatobiliary: Previous cholecystectomy. No biliary dilatation or obstruction. No focal hepatic abnormality by arterial phase imaging. Pancreas: Unremarkable. No pancreatic ductal dilatation or surrounding inflammatory changes. Spleen: Normal in size without focal abnormality. Adrenals/Urinary Tract: Normal adrenal glands. No renal obstruction or hydronephrosis. No acute perinephric edema or obstructing uropathy. 10 mm right midpole renal cortical hypodense cyst noted. Ureters are symmetric and decompressed. No hydroureter or ureteral calculus. Bladder unremarkable. Stomach/Bowel: Incidental duodenal diverticulum noted. Negative for bowel obstruction, significant dilatation, ileus, or free air. Normal appendix demonstrated. Scattered colonic diverticulosis. No fluid collection, hemorrhage, hematoma, abscess, or ascites. Lymphatic: No bulky adenopathy. Reproductive: No significant finding by CT. Other: Small fat containing left inguinal hernia. Intact abdominal wall. No ventral hernia. Musculoskeletal: Degenerative changes noted. No acute osseous finding. Review of the MIP images confirms the above findings. IMPRESSION: Negative for acute aortic dissection or other acute  vascular finding in the chest abdomen or pelvis. No acute intrathoracic, abdominal, or pelvic finding by CT Remote cholecystectomy Incidental duodenal diverticulum Colonic diverticulosis without acute inflammatory process Small fat containing left inguinal hernia Aortic Atherosclerosis (ICD10-I70.0). Electronically Signed   By: Jerilynn Mages.  Shick M.D.   On: 09/04/2019 08:33    EKG: Sinus bradycardia with lateral ST changes  Weights: Filed Weights   09/08/19 1942 09/09/19 0156  Weight: 111.6 kg 107.3 kg     Physical Exam: Blood pressure (!) 144/82, pulse (!) 50, temperature 97.8 F (36.6 C), temperature source Oral, resp. rate 14, height 5' 7"  (1.702 m), weight 107.3 kg, SpO2 96 %. Body mass index is 37.04 kg/m. General: Well developed, well nourished, in no acute distress. Head eyes ears nose throat: Normocephalic, atraumatic, sclera non-icteric, no xanthomas, nares are without discharge. No apparent thyromegaly and/or mass  Lungs: Normal respiratory effort.  Diffuse wheezes, no rales, no rhonchi.  Heart: RRR with normal S1 S2. no murmur gallop, no rub, PMI is normal size and placement, carotid upstroke normal without bruit, jugular venous pressure is normal Abdomen: Soft, non-tender, non-distended with normoactive bowel sounds. No hepatomegaly. No rebound/guarding. No obvious abdominal masses. Abdominal aorta is normal size without bruit Extremities trace edema. no cyanosis, no clubbing, no ulcers  Peripheral : 2+ bilateral upper extremity pulses, 2+ bilateral  femoral pulses, 2+ bilateral dorsal pedal pulse Neuro: Alert and oriented. No facial asymmetry. No focal deficit. Moves all extremities spontaneously. Musculoskeletal: Normal muscle tone without kyphosis Psych:  Responds to questions appropriately with a normal affect.    Assessment: 63 year old male with known cardiovascular disease status post coronary bypass graft stents and stable coronary disease by cath July 2020 on appropriate  medication management without evidence of myocardial infarction or congestive heart failure with atypical chest discomfort  Plan: 1.  No change in current medical regimen which were maximizing antianginal treatment from before including Ranexa and isosorbide 2.  High intensity cholesterol therapy 3.  Hypertension control with medications as before 4.  Anticoagulation for further risk reduction stroke with atrial fibrillation 5.  Begin ambulation and follow-up for improvements of symptoms and or need for further intervention 6.  No further cardiac diagnostics necessary at this time. 7.  Patient ambulating well with no further significant symptoms okay for discharge home with follow-up next week for adjustments of medication management  Signed, Corey Skains M.D. [facility] 09/09/2019, 7:37 AM

## 2019-09-09 NOTE — Plan of Care (Signed)

## 2019-09-09 NOTE — H&P (Addendum)
[facility] [facility] [place] NAME: Lawrence [name]    MR#:  [id number]  DATE OF BIRTH:  Oct 01, 1956  DATE OF ADMISSION:  09/08/2019  PRIMARY CARE PHYSICIAN: Birdie Sons, MD   REQUESTING/REFERRING PHYSICIAN: Arta Silence, MD  CHIEF COMPLAINT:   Chief Complaint  Patient presents with  . Epistaxis  . Chest Pain    HISTORY OF PRESENT ILLNESS:  Lawrence [name]  is a 63 y.o. Caucasian male with a known history of multiple medical problems that are mentioned below, including coronary artery disease status post CABG as well as PCI and stents who presented to the emergency room with onset of left-sided chest pain felt as pressure as if an elephant is sitting on his chest as well as nausea and diaphoresis without vomiting or dyspnea or palpitations.  He admitted to mild wheezing earlier without cough.  No fever or chills no headache or dizziness or blurred vision.  He had epistaxis when he came to the ER and that apparently resolved spontaneously.  Upon presentation to the ER blood pressure was 140/90 with a pulse of 58 with otherwise normal vital signs.  Labs revealed troponin I of and later on 11 with mild hypochloremia and a CO2 of 37.  His COVID-19 test is currently pending.  EKG showed normal sinus rhythm with a rate of 67 with T wave inversion laterally and Q waves anteroseptally.  The patient was given 10 mg of IV Reglan and 4 mg of IV morphine sulfate twice as well as 4 mg of IV Zofran.  The patient will be admitted to an observation telemetry bed for further evaluation and monitoring. PAST MEDICAL HISTORY:   Past Medical History:  Diagnosis Date  . A-fib ([place])   . Anemia   . Anginal pain ([place])   . Anxiety   . Arthritis   . Asthma   . CAD (coronary artery disease)    a. 2002 CABGx2 (LIMA->LAD, VG->VG->OM1);  b. 09/2012 DES->OM;  c. 03/2015 PTCA of LAD [facility]) in setting of atretic LIMA; d. 05/2015 Cath [facility]): nonobs dzs; e. 06/2015 Cath  ([facility]): LM nl, LAD 45p/d ISR, 50d, D1/2 small, LCX 50p/d ISR, OM1 70ost, 30 ISR, VG->OM1 50ost, 73m LIMA->LAD 99p/d - atretic, RCA dom, nl; f.cath 10/16: 40-50%(FFR 0.90) pLAD, 75% (FFR 0.77) mLAD s/p PCI/DES, oRCA 40% (FFR0.95)  . Cancer ([place]    SKIN CANCER ON BACK  . Celiac disease   . Chronic diastolic CHF (congestive heart failure) ([place]    a. 06/2009 Echo: EF 60-65%, Gr 1 DD, triv AI, mildly dil LA, nl RV.  .Marland KitchenCOPD (chronic obstructive pulmonary disease) ([place]    a. Chronic bronchitis and emphysema.  . DDD (degenerative disc disease), lumbar   . Diverticulosis   . Dysrhythmia   . Essential hypertension   . GERD (gastroesophageal reflux disease)   . History of hiatal hernia   . History of kidney stones    H/O  . History of tobacco abuse    a. Quit 2014.  .Marland KitchenMyocardial infarction ([place] 2002   4 STENTS  . Pancreatitis   . PSVT (paroxysmal supraventricular tachycardia) ([place]    a. 10/2012 Noted on Zio Patch.  . Sleep apnea    LOST CORD TO CPAP -ONLY 02 @ BEDTIME  . Tubular adenoma of colon   . Type II diabetes mellitus ([place]     PAST SURGICAL HISTORY:   Past Surgical History:  Procedure Laterality Date  . BYPASS GRAFT    .  CARDIAC CATHETERIZATION N/A 07/12/2015   rocedure: Left Heart Cath and Cors/Grafts Angiography;  Surgeon: Belva Crome, MD;  Location: [place] CV LAB;  Service: Cardiovascular;  Laterality: N/A;  . CARDIAC CATHETERIZATION Right 10/07/2015   Procedure: Left Heart Cath and Cors/Grafts Angiography;  Surgeon: Dionisio David, MD;  Location: [place] CV LAB;  Service: Cardiovascular;  Laterality: Right;  . CARDIAC CATHETERIZATION N/A 04/06/2016   Procedure: Left Heart Cath and Coronary Angiography;  Surgeon: Yolonda Kida, MD;  Location: [place] CV LAB;  Service: Cardiovascular;  Laterality: N/A;  . CARDIAC CATHETERIZATION  04/06/2016   Procedure: Bypass Graft Angiography;  Surgeon: Yolonda Kida, MD;  Location: [place] CV LAB;   Service: Cardiovascular;;  . CARDIAC CATHETERIZATION N/A 11/02/2016   Procedure: Left Heart Cath and Cors/Grafts Angiography and possible PCI;  Surgeon: Yolonda Kida, MD;  Location: [place] CV LAB;  Service: Cardiovascular;  Laterality: N/A;  . CARDIAC CATHETERIZATION N/A 11/02/2016   Procedure: Coronary Stent Intervention;  Surgeon: Yolonda Kida, MD;  Location: [place] CV LAB;  Service: Cardiovascular;  Laterality: N/A;  . CHOLECYSTECTOMY    . CIRCUMCISION N/A 06/09/2019   Procedure: CIRCUMCISION ADULT;  Surgeon: Billey Co, MD;  Location: [facility] ORS;  Service: Urology;  Laterality: N/A;  . COLONOSCOPY WITH PROPOFOL N/A 04/01/2018   Procedure: COLONOSCOPY WITH PROPOFOL;  Surgeon: Manya Silvas, MD;  Location: [facility] ENDOSCOPY;  Service: Endoscopy;  Laterality: N/A;  . ESOPHAGEAL DILATION    . ESOPHAGOGASTRODUODENOSCOPY (EGD) WITH PROPOFOL N/A 04/01/2018   Procedure: ESOPHAGOGASTRODUODENOSCOPY (EGD) WITH PROPOFOL;  Surgeon: Manya Silvas, MD;  Location[facility] END[facility]COPY;  Service: Endoscopy;  Laterality: N/A;  . LEFT HEART CATH AND CORS/GRAFTS ANGIOGRAPHY N/A 06/12/2019   Procedure: LEFT HEART CATH AND CORS/GRAFTS ANGIOGRAPHY;  Surgeon: Teodoro Spray, MD;  Location: [place] CV LAB;  Service: Cardiovascular;  Laterality: N/A;  . TONSILLECTOMY    . VASCULAR SURGERY      SOCIAL HISTORY:   Social History   Tobacco Use  . Smoking status: Former Smoker    Packs/day: 3.00    Years: 50.00    Pack years: 150.00    Types: Cigarettes    Quit date: 04/22/2013    Years since quitting: 6.3  . Smokeless tobacco: Never Used  Substance Use Topics  . Alcohol use: No    Comment: remotely quit alcohol use. Hx of heavy alcohol use.    FAMILY HISTORY:   Family History  Problem Relation Age of Onset  . Heart attack Mother   . Depression Mother   . Heart disease Mother   . COPD Mother   . Hypertension Mother   . Heart attack Father   . Diabetes Father   .  Depression Father   . Heart disease Father   . Cirrhosis Father   . Parkinson's disease Brother     DRUG ALLERGIES:   Allergies  Allergen Reactions  . Demerol  [Meperidine Hcl]   . Demerol [Meperidine] Hives  . Jardiance [Empagliflozin] Other (See Comments)    Perineal pain  . Prednisone Other (See Comments) and Hypertension    Pt states that this medication puts him in A-fib   . Sulfa Antibiotics Hives  . Albuterol Sulfate [Albuterol] Palpitations and Other (See Comments)    Pt currently uses this medication.    . Morphine Sulfate Nausea And Vomiting, Rash and Other (See Comments)    Pt states that he is only allergic to the tablet form  of this medication.      REVIEW OF SYSTEMS:   ROS As per history of present illness. All pertinent systems were reviewed above. Constitutional,  HEENT, cardiovascular, respiratory, GI, GU, musculoskeletal, neuro, psychiatric, endocrine,  integumentary and hematologic systems were reviewed and are otherwise  negative/unremarkable except for positive findings mentioned above in the HPI.   MEDICATIONS AT HOME:   Prior to Admission medications   Medication Sig Start Date End Date Taking? Authorizing Provider  allopurinol (ZYLOPRIM) 300 MG tablet Take 1 tablet (300 mg total) by mouth 2 (two) times daily. 10/05/18  Yes Birdie Sons, MD  ALPRAZolam Duanne Moron) 1 MG tablet Take 1 tablet (1 mg total) by mouth 3 (three) times daily. 06/23/19  Yes Birdie Sons, MD  arformoterol (BROVANA) 15 MCG/2ML NEBU Take 15 mcg by nebulization 2 (two) times daily.   Yes [provider]  atorvastatin (LIPITOR) 80 MG tablet TAKE ONE TABLET BY MOUTH AT BEDTIME. Patient taking differently: Take 80 mg by mouth at bedtime.  09/19/18  Yes Birdie Sons, MD  Blood Glucose Monitoring Suppl (ACCU-CHEK AVIVA PLUS) w/Device KIT CHECK BLOOD SUGAR EVERY DAY 12/08/17  Yes Birdie Sons, MD  budesonide (PULMICORT) 0.5 MG/2ML nebulizer solution Take 0.5 mg by  nebulization 2 (two) times daily.   Yes [provider]  cetirizine (ZYRTEC) 10 MG tablet TAKE ONE TABLET BY MOUTH AT BEDTIME 08/01/19  Yes Birdie Sons, MD  citalopram (CELEXA) 20 MG tablet Take 20 mg by mouth daily.   Yes [provider]  ELIQUIS 5 MG TABS tablet TAKE 1 TABLET BY MOUTH TWICE A DAY 08/10/19  Yes Birdie Sons, MD  glucose blood (ACCU-CHEK AVIVA PLUS) test strip Use to check blood sugar once a day 02/14/18  Yes [name], [name], MD  insulin lispro (HUMALOG) 100 UNIT/ML injection Inject 0-12 Units into the skin 3 (three) times daily before meals. As directed   Yes [provider]  ipratropium-albuterol (DUONEB) 0.5-2.5 (3) MG/3ML SOLN Take 3 mLs by nebulization every 6 (six) hours as needed.   Yes [provider]  isosorbide mononitrate (IMDUR) 120 MG 24 hr tablet Take 1 tablet (120 mg total) by mouth daily. 08/10/19  Yes Birdie Sons, MD  lisinopril (PRINIVIL,ZESTRIL) 2.5 MG tablet TAKE 1 TABLET BY MOUTH DAILY Patient taking differently: Take 5 mg by mouth every morning.  10/20/18  Yes Birdie Sons, MD  magnesium oxide (MAG-OX) 400 (241.3 Mg) MG tablet Take 1 tablet (400 mg total) by mouth every morning. 06/13/19  Yes Hackney, Tina A, FNP  oxyCODONE-acetaminophen (PERCOCET) 10-325 MG tablet Take 1 tablet by mouth every 4 (four) hours. 08/18/19  Yes Birdie Sons, MD  OXYGEN Inhale 2 L into the lungs continuous.    Yes [provider]  pantoprazole (PROTONIX) 20 MG tablet Take 20 mg by mouth daily.   Yes [provider]  ranolazine (RANEXA) 500 MG 12 hr tablet Take 1 tablet (500 mg total) by mouth 2 (two) times daily. Patient taking differently: Take 1,000 mg by mouth 2 (two) times daily.  06/12/19  Yes Dustin Flock, MD  sotalol (BETAPACE) 80 MG tablet Take 80 mg by mouth 2 (two) times daily.   Yes [provider]  torsemide (DEMADEX) 100 MG tablet Take 50 mg by mouth daily.  10/11/17  Yes [provider]  Albuterol Sulfate, sensor, 108 (90 Base) MCG/ACT AEPB Inhale 2 puffs into the lungs at bedtime. AND PRN    [provider]  aspirin 81 MG chewable tablet Chew 81 mg by mouth daily.    [provider]  [place] Patter 100-25 MCG/INH AEPB INHALE 1 PUFF INTO THE LUNGS DAILY Patient not taking: Reported on 09/08/2019 06/13/19   Kendell Bane, NP  Cholecalciferol (VITAMIN D3) 125 MCG (5000 UT) CAPS Take 5,000 Units by mouth daily.    [provider]  dapagliflozin propanediol (FARXIGA) 10 MG TABS tablet Take 10 mg by mouth daily. Patient not taking: Reported on 09/08/2019 05/29/19   Birdie Sons, MD  docusate sodium (COLACE) 100 MG capsule Take 100 mg by mouth daily.     [provider]  LINZESS 72 MCG capsule Take 72 mcg by mouth daily before breakfast.     [provider]  methocarbamol (ROBAXIN) 500 MG tablet Take 1 tablet (500 mg total) by mouth 4 (four) times daily. Patient not taking: Reported on 09/08/2019 05/29/19   Birdie Sons, MD  nitroGLYCERIN (NITROSTAT) 0.4 MG SL tablet Place 1 tablet (0.4 mg total) under the tongue every 5 (five) minutes as needed for chest pain. Reported on 05/26/2016 11/28/18   Birdie Sons, MD  nystatin-triamcinolone (MYCOLOG II) cream Apply 1 application topically 2 (two) times daily. Patient not taking: Reported on 09/08/2019 06/29/19   Billey Co, MD  omega-3 acid ethyl esters (LOVAZA) 1 g capsule Take 4 capsules (4 g total) by mouth daily. Patient not taking: Reported on 09/08/2019 06/26/19   Birdie Sons, MD  omeprazole (PRILOSEC) 40 MG capsule Take 40 mg by mouth 2 (two) times a day.  04/24/19   [provider]  tiotropium (SPIRIVA HANDIHALER) 18 MCG inhalation capsule Place 1 capsule (18 mcg total) into inhaler and inhale daily. Patient not taking: Reported on 09/08/2019 02/20/19   Birdie Sons, MD  traZODone (DESYREL) 100 MG tablet TAKE 1 TABLET BY MOUTH AT BEDTIME FOR SLEEP  Patient not taking: Reported on 09/08/2019 08/28/19   Birdie Sons, MD  venlafaxine XR (EFFEXOR-XR) 75 MG 24 hr capsule Take 1 capsule (75 mg total) by mouth daily with breakfast. Patient not taking: Reported on 09/08/2019 04/04/19   Birdie Sons, MD      VITAL SIGNS:  Blood pressure 119/79, pulse (!) 56, temperature 98.8 F (37.1 C), temperature source Oral, resp. rate 12, height _0  (1.702 m), weight 111.6 kg, SpO2 96 %.  PHYSICAL EXAMINATION:  Physical Exam  GENERAL:  63 y.o.-year-old Caucasian male patient lying in the bed with no acute distress.  EYES: Pupils equal, round, reactive to light and accommodation. No scleral icterus. Extraocular muscles intact.  HEENT: Head atraumatic, normocephalic. Oropharynx and nasopharynx clear.  NECK:  Supple, no jugular venous distention. No thyroid enlargement, no tenderness.  LUNGS: Normal breath sounds bilaterally, no wheezing, rales,rhonchi or crepitation. No use of accessory muscles of respiration.  CARDIOVASCULAR: Regular rate and rhythm, S1, S2 normal. No murmurs, rubs, or gallops.  ABDOMEN: Soft, nondistended, nontender. Bowel sounds present. No organomegaly or mass.  EXTREMITIES: No pedal edema, cyanosis, or clubbing.  NEUROLOGIC: Cranial nerves II through XII are intact. Muscle strength 5/5 in all extremities. Sensation intact. Gait not checked.  PSYCHIATRIC: The patient is alert and oriented x 3.  Normal affect and good eye contact. SKIN: No obvious rash, lesion, or ulcer.   LABORATORY PANEL:   CBC Recent Labs  Lab 09/08/19 1950  WBC 7.2  HGB 15.6  HCT 51.3  PLT 177   ------------------------------------------------------------------------------------------------------------------  Chemistries  Recent Labs  Lab 09/08/19 1950  NA 140  K 3.8  CL 91*  CO2 37*  GLUCOSE 159*  BUN 17  CREATININE 1.09  CALCIUM 9.5  AST 17  ALT 22  ALKPHOS 75  BILITOT 0.7    ------------------------------------------------------------------------------------------------------------------  Cardiac Enzymes No results for input(s): TROPONINI in the last 168 hours. ------------------------------------------------------------------------------------------------------------------  RADIOLOGY:  Dg Chest Portable 1 View  Result Date: 09/08/2019 CLINICAL DATA:  Chest pain EXAM: PORTABLE CHEST 1 VIEW COMPARISON:  08/26/2019 FINDINGS: Cardiomegaly status post median sternotomy. Both lungs are clear. The visualized skeletal structures are unremarkable. IMPRESSION: Cardiomegaly without acute abnormality of the lungs in AP portable projection. Electronically Signed   By: Eddie Candle M.D.   On: 09/08/2019 19:52      IMPRESSION AND PLAN:   1.Chest pain, rule out acute coronary syndrome.  The patient will be admitted to an observation telemetry bed.  Will follow serial cardiac enzymes and EKGs.  We will obtain a cardiology consult in a.m. for further cardiac risk stratification.  The patient will be placed on aspirin as well as p.r.n. sublingual nitroglycerin and morphine sulfate for pain.  The patient had a recent epistaxis that has resolved.  Will monitor any recurrence on aspirin which benefits are thought to outweigh risks at this time.  2.  Hypertension.  We will continue lisinopril.  3.  Coronary artery disease.  We will continue Ranexa as well as aspirin  4.  Paroxysmal atrial fibrillation.  Continue Eliquis and sotalol.  5.  GERD.  We will continue PPI therapy.  6.  Asthma.  We will continue Spiriva and albuterol.  7.  Gout.  We will continue allopurinol.  8.  DVT prophylaxis.  Continue Eliquis.   All the records are reviewed and case discussed with ED provider. The plan of care was discussed in details with the patient (and family). I answered all questions. The patient agreed to proceed with the above mentioned plan. Further management will depend upon  hospital course.   CODE STATUS: Full code  TOTAL TIME TAKING CARE OF THIS PATIENT: 50 minutes.    Christel Mormon M.D on 09/09/2019 at 12:48 AM  Pager - [phone]  After 6pm go to [email or web address] - Proofreader  [facility]  Office  [phone]  CC: Primary care physician; Birdie Sons, MD   Note: This dictation was prepared with Dragon dictation along with smaller phrase technology. Any transcriptional errors that result from this process are unintentional.

## 2019-09-09 NOTE — Progress Notes (Signed)
Patient transferred from ED to room 248 via stretcher. Oriented patient to room and room equipment. Educated and explained fall risk policy to patient and encouraged to dial RN's phone or use the call bell to request for assistance.  Patient requested something for the constant chest pain that he actually was admitted with and states is a pain level -8 on scale of 1-10. Administered morphine per PRN order. Will continue to monitor to end of shift.

## 2019-09-09 NOTE — Progress Notes (Signed)
[place] [facility] [place] NAME: Lawrence [name]    MR#:  [id number]  DATE OF BIRTH:  September 30, 1956  SUBJECTIVE:  patient complains of constant left-sided chest pain without any radiation diaphoresis nausea vomiting today He recently has lost his wife about a month ago. They were together for 38 years. Patient is still bereaving and missing her a lot. Denies any suicidal ideation  REVIEW OF SYSTEMS:   Review of Systems  Constitutional: Negative for chills, fever and weight loss.  HENT: Negative for ear discharge, ear pain and nosebleeds.   Eyes: Negative for blurred vision, pain and discharge.  Respiratory: Negative for sputum production, shortness of breath, wheezing and stridor.   Cardiovascular: Positive for chest pain. Negative for palpitations, orthopnea and PND.  Gastrointestinal: Negative for abdominal pain, diarrhea, nausea and vomiting.  Genitourinary: Negative for frequency and urgency.  Musculoskeletal: Negative for back pain and joint pain.  Neurological: Negative for sensory change, speech change, focal weakness and weakness.  Psychiatric/Behavioral: Positive for depression. Negative for hallucinations. The patient is nervous/anxious.    Tolerating Diet:yesTolerating PT:   DRUG ALLERGIES:   Allergies  Allergen Reactions  . Demerol  [Meperidine Hcl]   . Demerol [Meperidine] Hives  . Jardiance [Empagliflozin] Other (See Comments)    Perineal pain  . Prednisone Other (See Comments) and Hypertension    Pt states that this medication puts him in A-fib   . Sulfa Antibiotics Hives  . Albuterol Sulfate [Albuterol] Palpitations and Other (See Comments)    Pt currently uses this medication.    . Morphine Sulfate Nausea And Vomiting, Rash and Other (See Comments)    Pt states that he is only allergic to the tablet form of this medication.      VITALS:  Blood pressure 140/84, pulse (!) 50, temperature 97.8 F (36.6 C), temperature  source Oral, resp. rate 18, height 5' 7"  (1.702 m), weight 107.3 kg, SpO2 98 %.  PHYSICAL EXAMINATION:   Physical Exam  GENERAL:  63 y.o.-year-old patient lying in the bed with no acute distress.  EYES: Pupils equal, round, reactive to light and accommodation. No scleral icterus. Extraocular muscles intact.  HEENT: Head atraumatic, normocephalic. Oropharynx and nasopharynx clear.  NECK:  Supple, no jugular venous distention. No thyroid enlargement, no tenderness.  LUNGS: Normal breath sounds bilaterally, no wheezing, rales, rhonchi. No use of accessory muscles of respiration.  CARDIOVASCULAR: S1, S2 normal. No murmurs, rubs, or gallops.  ABDOMEN: Soft, nontender, nondistended. Bowel sounds present. No organomegaly or mass.  EXTREMITIES: No cyanosis, clubbing or edema b/l.    NEUROLOGIC: Cranial nerves II through XII are intact. No focal Motor or sensory deficits b/l.   PSYCHIATRIC:  patient is alert and oriented x 3.  SKIN: No obvious rash, lesion, or ulcer.   LABORATORY PANEL:  CBC Recent Labs  Lab 09/08/19 1950  WBC 7.2  HGB 15.6  HCT 51.3  PLT 177    Chemistries  Recent Labs  Lab 09/08/19 1950  NA 140  K 3.8  CL 91*  CO2 37*  GLUCOSE 159*  BUN 17  CREATININE 1.09  CALCIUM 9.5  AST 17  ALT 22  ALKPHOS 75  BILITOT 0.7   Cardiac Enzymes No results for input(s): TROPONINI in the last 168 hours. RADIOLOGY:  Dg Chest Portable 1 View  Result Date: 09/08/2019 CLINICAL DATA:  Chest pain EXAM: PORTABLE CHEST 1 VIEW COMPARISON:  08/26/2019 FINDINGS: Cardiomegaly status post median sternotomy. Both lungs are clear. The  visualized skeletal structures are unremarkable. IMPRESSION: Cardiomegaly without acute abnormality of the lungs in AP portable projection. Electronically Signed   By: Eddie Candle M.D.   On: 09/08/2019 19:52   ASSESSMENT AND PLAN:  Kortez Murtagh  is a 63 y.o. Caucasian male with a known history of multiple medical problems that are mentioned below,  including coronary artery disease status post CABG as well as PCI and stents who presented to the emergency room with onset of left-sided chest pain felt as pressure as if an elephant is sitting on his chest   1.Chest pain, rule out acute coronary syndrome. -  serial cardiac enzymes so far negative and EKGs not suggestive of MI.  -Cardiology consultation with Dr. [name] noted. No further workup recommended. Continue current cardiac meds  2.  Hypertension.  We will continue lisinopril.  3.  Coronary artery disease.  We will continue Ranexa as well as aspirin  4.  Paroxysmal atrial fibrillation.  Continue Eliquis and sotalol.  5.  GERD.  We will continue PPI therapy.  6.  Asthma.  We will continue Spiriva and albuterol.  7.  Gout.  We will continue allopurinol.  8.  DVT prophylaxis.  Continue Eliquis.  9. Depression/anxiety with recent loss of wife about a month ago. Patient feels lonely and likely has increasing depression with anxiety contributing to some of the symptoms he is experiencing. -Patient is on Xanax and Cymbalta. I have asked psychiatry to come see patient. -Also has been informed to have nurse paged chaplain if he needs to discuss or talk with anyone in the hospital.  Ambulate today. Anticipate discharged tomorrow if continues to show improvement and patient remains stable. Patient is in agreement with plan  Case discussed with Care Management/Social Worker. Management plans discussed with the patient and they are in agreement.  CODE STATUS: full  DVT Prophylaxis: lovenox  TOTAL TIME TAKING CARE OF THIS PATIENT: *30* minutes.  >50% time spent on counselling and coordination of care  POSSIBLE D/C IN *1-2* DAYS, DEPENDING ON CLINICAL CONDITION.  Note: This dictation was prepared with Dragon dictation along with smaller phrase technology. Any transcriptional errors that result from this process are unintentional.  [name] M.D on 09/09/2019 at 2:14  PM  Between 7am to 6pm - Pager - [phone]  After 6pm go to [email or web address] - password [email or web address] [place] [facility]  Office  [phone]  CC: Primary care physician; Birdie Sons, MDPatient ID: [name], male   DOB: 12-15-1955, 63 y.o.   MRN: [id number]

## 2019-09-09 NOTE — ED Notes (Signed)
Attempt to call report, per yasmin on floor nursing supervisor is reviewing pt's chart and they are not able to take report at this time.

## 2019-09-10 DIAGNOSIS — I2510 Atherosclerotic heart disease of native coronary artery without angina pectoris: Secondary | ICD-10-CM | POA: Diagnosis not present

## 2019-09-10 DIAGNOSIS — R079 Chest pain, unspecified: Secondary | ICD-10-CM | POA: Diagnosis not present

## 2019-09-10 DIAGNOSIS — F329 Major depressive disorder, single episode, unspecified: Secondary | ICD-10-CM | POA: Diagnosis not present

## 2019-09-10 DIAGNOSIS — I10 Essential (primary) hypertension: Secondary | ICD-10-CM | POA: Diagnosis not present

## 2019-09-10 LAB — TROPONIN I (HIGH SENSITIVITY): Troponin I (High Sensitivity): 9 ng/L (ref <18)

## 2019-09-10 MED ORDER — RANOLAZINE ER 500 MG PO TB12: 1000.0000 mg | ORAL_TABLET | Freq: Two times a day (BID) | ORAL | 1 refills | Status: DC

## 2019-09-10 MED ORDER — LISINOPRIL 2.5 MG PO TABS: 5.0000 mg | ORAL_TABLET | ORAL | 0 refills | Status: DC

## 2019-09-10 MED ORDER — CLONAZEPAM 0.5 MG PO TABS: 0.5000 mg | ORAL_TABLET | Freq: Two times a day (BID) | ORAL | 0 refills | Status: DC

## 2019-09-10 MED ORDER — CLONAZEPAM 0.5 MG PO TABS: 0.5000 mg | ORAL_TABLET | Freq: Two times a day (BID) | ORAL | Status: DC

## 2019-09-10 NOTE — Discharge Summary (Signed)
[facility] NAME: Lawrence [name]    MR#:  [id number]  DATE OF BIRTH:  07/05/1956  DATE OF ADMISSION:  09/08/2019 ADMITTING PHYSICIAN: Christel Mormon, MD  DATE OF DISCHARGE: 09/10/2019  PRIMARY CARE PHYSICIAN: Birdie Sons, MD    ADMISSION DIAGNOSIS:  Chest pain, unspecified type [R07.9]  DISCHARGE DIAGNOSIS:  Chest pain--appears non cardiac Anxiety/depression  SECONDARY DIAGNOSIS:   Past Medical History:  Diagnosis Date  . A-fib ([place])   . Anemia   . Anginal pain ([place])   . Anxiety   . Arthritis   . Asthma   . CAD (coronary artery disease)    a. 2002 CABGx2 (LIMA->LAD, VG->VG->OM1);  b. 09/2012 DES->OM;  c. 03/2015 PTCA of LAD [facility]) in setting of atretic LIMA; d. 05/2015 Cath [facility]): nonobs dzs; e. 06/2015 Cath ([facility]): LM nl, LAD 45p/d ISR, 50d, D1/2 small, LCX 50p/d ISR, OM1 70ost, 30 ISR, VG->OM1 50ost, 44m LIMA->LAD 99p/d - atretic, RCA dom, nl; f.cath 10/16: 40-50%(FFR 0.90) pLAD, 75% (FFR 0.77) mLAD s/p PCI/DES, oRCA 40% (FFR0.95)  . Cancer ([place]    SKIN CANCER ON BACK  . Celiac disease   . Chronic diastolic CHF (congestive heart failure) ([place]    a. 06/2009 Echo: EF 60-65%, Gr 1 DD, triv AI, mildly dil LA, nl RV.  .[place] KitchenCOPD (chronic obstructive pulmonary disease) ([place]    a. Chronic bronchitis and emphysema.  . DDD (degenerative disc disease), lumbar   . Diverticulosis   . Dysrhythmia   . Essential hypertension   . GERD (gastroesophageal reflux disease)   . History of hiatal hernia   . History of kidney stones    H/O  . History of tobacco abuse    a. Quit 2014.  .[place] KitchenMyocardial infarction ([place] 2002   4 STENTS  . Pancreatitis   . PSVT (paroxysmal supraventricular tachycardia) ([place]    a. 10/2012 Noted on Zio Patch.  . Sleep apnea    LOST CORD TO CPAP -ONLY 02 @ BEDTIME  . Tubular adenoma of colon   . Type II diabetes mellitus ([facility]     HOSPITAL COURSE:  Lawrence [name] a63 y.o.Caucasian malewith a  known history ofmultiple medical problems that are mentioned below, including coronary artery disease status post CABG as well as PCI and stents who presented to the emergency room with onset of left-sided chest pain felt as pressure as if an elephant is sitting on his chest   1.Chest pain, ruled out acute coronary syndrome. -  serial cardiac enzymes so far negative and EKGs not suggestive of MI.  -Cardiology consultation with Dr. KNehemiah Massednoted. No further workup recommended. Continue current cardiac meds. I have optimzed his cardiac meds  2. Hypertension.  -continue lisinopril.  3. Coronary artery disease. We will continue Ranexa as well as aspirin  4. Paroxysmal atrial fibrillation. Continue Eliquis and sotalol.  5. GERD. We will continue PPI therapy.  6. Asthma. We will continue Spiriva and albuterol.  7. Gout. We will continue allopurinol.  8. DVT prophylaxis. Continue Eliquis.  9. Depression/anxiety with recent loss of wife about a month ago. Patient feels lonely and likely has increasing depression with anxiety contributing to some of the symptoms he is experiencing. -Patient is on Xanax and Cymbalta. I have asked psychiatry to come see patient--appreciate input. Recommends d/c xanax and start po klonopin 0.5 mg bid--pt denies SI. Out pt resources provided.  Will d/c home later today. [facility] fCaryn Section  Pt agreeable.  CONSULTS OBTAINED:    DRUG ALLERGIES:   Allergies  Allergen Reactions  . Demerol  [Meperidine Hcl]   . Demerol [Meperidine] Hives  . Jardiance [Empagliflozin] Other (See Comments)    Perineal pain  . Prednisone Other (See Comments) and Hypertension    Pt states that this medication puts him in A-fib   . Sulfa Antibiotics Hives  . Albuterol Sulfate [Albuterol] Palpitations and Other (See Comments)    Pt currently uses this medication.    . Morphine Sulfate Nausea And Vomiting, Rash and Other (See Comments)    Pt  states that he is only allergic to the tablet form of this medication.      DISCHARGE MEDICATIONS:   Allergies as of 09/10/2019      Reactions   Demerol  [meperidine Hcl]    Demerol [meperidine] Hives   Jardiance [empagliflozin] Other (See Comments)   Perineal pain   Prednisone Other (See Comments), Hypertension   Pt states that this medication puts him in A-fib    Sulfa Antibiotics Hives   Albuterol Sulfate [albuterol] Palpitations, Other (See Comments)   Pt currently uses this medication.     Morphine Sulfate Nausea And Vomiting, Rash, Other (See Comments)   Pt states that he is only allergic to the tablet form of this medication.        Medication List    STOP taking these medications   ALPRAZolam 1 MG tablet Commonly known as: XANAX   Breo Ellipta 100-25 MCG/INH Aepb Generic drug: fluticasone furoate-vilanterol   Farxiga 10 MG Tabs tablet Generic drug: dapagliflozin propanediol   methocarbamol 500 MG tablet Commonly known as: Robaxin   nystatin-triamcinolone cream Commonly known as: MYCOLOG II   omega-3 acid ethyl esters 1 g capsule Commonly known as: LOVAZA   omeprazole 40 MG capsule Commonly known as: PRILOSEC   tiotropium 18 MCG inhalation capsule Commonly known as: Spiriva HandiHaler   traZODone 100 MG tablet Commonly known as: DESYREL   venlafaxine XR 75 MG 24 hr capsule Commonly known as: Effexor XR     TAKE these medications   Accu-Chek Aviva Plus w/Device Kit CHECK BLOOD SUGAR EVERY DAY   Albuterol Sulfate (sensor) 108 (90 Base) MCG/ACT Aepb Inhale 2 puffs into the lungs at bedtime. AND PRN   allopurinol 300 MG tablet Commonly known as: ZYLOPRIM Take 1 tablet (300 mg total) by mouth 2 (two) times daily.   arformoterol 15 MCG/2ML Nebu Commonly known as: BROVANA Take 15 mcg by nebulization 2 (two) times daily.   aspirin 81 MG chewable tablet Chew 81 mg by mouth daily.   atorvastatin 80 MG tablet Commonly known as: LIPITOR TAKE ONE  TABLET BY MOUTH AT BEDTIME.   budesonide 0.5 MG/2ML nebulizer solution Commonly known as: PULMICORT Take 0.5 mg by nebulization 2 (two) times daily.   cetirizine 10 MG tablet Commonly known as: ZYRTEC TAKE ONE TABLET BY MOUTH AT BEDTIME   citalopram 20 MG tablet Commonly known as: CELEXA Take 20 mg by mouth daily.   clonazePAM 0.5 MG tablet Commonly known as: KLONOPIN Take 1 tablet (0.5 mg total) by mouth 2 (two) times daily.   docusate sodium 100 MG capsule Commonly known as: COLACE Take 100 mg by mouth daily.   Eliquis 5 MG Tabs tablet Generic drug: apixaban TAKE 1 TABLET BY MOUTH TWICE A DAY   glucose blood test strip Commonly known as: Accu-Chek Aviva Plus Use to check blood sugar once a day   insulin lispro 100 UNIT/ML  injection Commonly known as: HUMALOG Inject 0-12 Units into the skin 3 (three) times daily before meals. As directed   ipratropium-albuterol 0.5-2.5 (3) MG/3ML Soln Commonly known as: DUONEB Take 3 mLs by nebulization every 6 (six) hours as needed.   isosorbide mononitrate 120 MG 24 hr tablet Commonly known as: IMDUR Take 1 tablet (120 mg total) by mouth daily.   Linzess 72 MCG capsule Generic drug: linaclotide Take 72 mcg by mouth daily before breakfast.   lisinopril 2.5 MG tablet Commonly known as: ZESTRIL Take 2 tablets (5 mg total) by mouth every morning.   magnesium oxide 400 (241.3 Mg) MG tablet Commonly known as: MAG-OX Take 1 tablet (400 mg total) by mouth every morning.   nitroGLYCERIN 0.4 MG SL tablet Commonly known as: NITROSTAT Place 1 tablet (0.4 mg total) under the tongue every 5 (five) minutes as needed for chest pain. Reported on 05/26/2016   oxyCODONE-acetaminophen 10-325 MG tablet Commonly known as: PERCOCET Take 1 tablet by mouth every 4 (four) hours.   OXYGEN Inhale 2 L into the lungs continuous.   pantoprazole 20 MG tablet Commonly known as: PROTONIX Take 20 mg by mouth daily.   ranolazine 500 MG 12 hr  tablet Commonly known as: RANEXA Take 2 tablets (1,000 mg total) by mouth 2 (two) times daily.   sotalol 80 MG tablet Commonly known as: BETAPACE Take 80 mg by mouth 2 (two) times daily.   torsemide 100 MG tablet Commonly known as: DEMADEX Take 50 mg by mouth daily.   Vitamin D3 125 MCG (5000 UT) Caps Take 5,000 Units by mouth daily.       If you experience worsening of your admission symptoms, develop shortness of breath, life threatening emergency, suicidal or homicidal thoughts you must seek medical attention immediately by calling 911 or calling your MD immediately  if symptoms less severe.  You Must read complete instructions/literature along with all the possible adverse reactions/side effects for all the Medicines you take and that have been prescribed to you. Take any new Medicines after you have completely understood and accept all the possible adverse reactions/side effects.   Please note  You were cared for by a hospitalist during your hospital stay. If you have any questions about your discharge medications or the care you received while you were in the hospital after you are discharged, you can call the unit and asked to speak with the hospitalist on call if the hospitalist that took care of you is not available. Once you are discharged, your primary care physician will handle any further medical issues. Please note that NO REFILLS for any discharge medications will be authorized once you are discharged, as it is imperative that you return to your primary care physician (or establish a relationship with a primary care physician if you do not have one) for your aftercare needs so that they can reassess your need for medications and monitor your lab values. Today   SUBJECTIVE  Ongoing chest heaviness. Stable vitals. Tolerating po diet. No SI Tells me son lives a block away and checks on him   VITAL SIGNS:  Blood pressure 113/65, pulse (!) 57, temperature 98.2 F (36.8 C),  temperature source Oral, resp. rate 18, height _0  (1.702 m), weight 107.7 kg, SpO2 95 %.  I/O:    Intake/Output Summary (Last 24 hours) at 09/10/2019 1052 Last data filed at 09/10/2019 0938 Gross per 24 hour  Intake 480 ml  Output 3050 ml  Net -2570 ml  PHYSICAL EXAMINATION:  GENERAL:  63 y.o.-year-old patient lying in the bed with no acute distress.  EYES: Pupils equal, round, reactive to light and accommodation. No scleral icterus. Extraocular muscles intact.  HEENT: Head atraumatic, normocephalic. Oropharynx and nasopharynx clear.  NECK:  Supple, no jugular venous distention. No thyroid enlargement, no tenderness.  LUNGS: Normal breath sounds bilaterally, no wheezing, rales,rhonchi or crepitation. No use of accessory muscles of respiration.  CARDIOVASCULAR: S1, S2 normal. No murmurs, rubs, or gallops.  ABDOMEN: Soft, non-tender, non-distended. Bowel sounds present. No organomegaly or mass.  EXTREMITIES: No pedal edema, cyanosis, or clubbing.  NEUROLOGIC: Cranial nerves II through XII are intact. Muscle strength 5/5 in all extremities. Sensation intact. Gait not checked.  PSYCHIATRIC: The patient is alert and oriented x 3.  SKIN: No obvious rash, lesion, or ulcer.   DATA REVIEW:   CBC  Recent Labs  Lab 09/08/19 1950  WBC 7.2  HGB 15.6  HCT 51.3  PLT 177    Chemistries  Recent Labs  Lab 09/08/19 1950  NA 140  K 3.8  CL 91*  CO2 37*  GLUCOSE 159*  BUN 17  CREATININE 1.09  CALCIUM 9.5  AST 17  ALT 22  ALKPHOS 75  BILITOT 0.7    Microbiology Results   Recent Results (from the past 240 hour(s))  SARS CORONAVIRUS 2 (TAT 6-24 HRS) Nasopharyngeal Nasopharyngeal Swab     Status: None   Collection Time: 09/08/19 11:51 PM   Specimen: Nasopharyngeal Swab  Result Value Ref Range Status   SARS Coronavirus 2 NEGATIVE NEGATIVE Final    Comment: (NOTE) SARS-CoV-2 target nucleic acids are NOT DETECTED. The SARS-CoV-2 RNA is generally detectable in upper and  lower respiratory specimens during the acute phase of infection. Negative results do not preclude SARS-CoV-2 infection, do not rule out co-infections with other pathogens, and should not be used as the sole basis for treatment or other patient management decisions. Negative results must be combined with clinical observations, patient history, and epidemiological information. The expected result is Negative. Fact Sheet for Patients: SugarRoll.be Fact Sheet for Healthcare Providers: https://www.woods-mathews.com/ This test is not yet approved or cleared by the [place] FDA and  has been authorized for detection and/or diagnosis of SARS-CoV-2 by FDA under an Emergency Use Authorization (EUA). This EUA will remain  in effect (meaning this test can be used) for the duration of the COVID-19 declaration under Section 56 4(b)(1) of the Act, 21 U.S.C. section 360bbb-3(b)(1), unless the authorization is terminated or revoked sooner. Performed at [facility], [place] [address], [place], [place] [address]     RADIOLOGY:  Dg Chest Portable 1 View  Result Date: 09/08/2019 CLINICAL DATA:  Chest pain EXAM: PORTABLE CHEST 1 VIEW COMPARISON:  08/26/2019 FINDINGS: Cardiomegaly status post median sternotomy. Both lungs are clear. The visualized skeletal structures are unremarkable. IMPRESSION: Cardiomegaly without acute abnormality of the lungs in AP portable projection. Electronically Signed   By: Eddie Candle M.D.   On: 09/08/2019 19:52     CODE STATUS:     Code Status Orders  (From admission, onward)         Start     Ordered   09/09/19 0002  Full code  Continuous     09/09/19 0005        Code Status History    Date Active Date Inactive Code Status Order ID Comments User Context   06/11/2019 0001 06/12/2019 1914 Full Code [id number]  Lance Coon, MD Inpatient  12/14/2018 2323 12/16/2018 1505 Full Code [id number]  [name], MD Inpatient    04/18/2018 1546 04/19/2018 1831 Full Code [id number]  Saundra Shelling, MD Inpatient   10/01/2017 0053 10/01/2017 1758 Full Code [id number]  Lance Coon, MD Inpatient   10/29/2016 2222 11/03/2016 1618 Full Code [id number]  Lance Coon, MD Inpatient   09/03/2016 0143 09/03/2016 1846 Full Code [id number]  Lance Coon, MD ED   06/25/2016 0036 06/26/2016 1535 Full Code [id number]  Lance Coon, MD ED   04/03/2016 2055 04/07/2016 1759 Full Code [id number]  Theodoro Grist, MD Inpatient   03/26/2016 2224 03/28/2016 1800 Full Code [id number]  Lance Coon, MD Inpatient   10/07/2015 0936 10/07/2015 1830 Full Code [id number]  Dionisio David, MD Inpatient   10/07/2015 0826 10/07/2015 0936 Full Code [id number]  Dionisio David, MD Inpatient   10/05/2015 1929 10/07/2015 0826 Full Code [id number]  Idelle Crouch, MD Inpatient   09/12/2015 2129 09/14/2015 1425 Full Code [id number]  Demetrios Loll, MD Inpatient   08/01/2015 0521 08/02/2015 1346 Full Code [id number]  Lance Coon, MD Inpatient   07/12/2015 1524 07/13/2015 2017 Full Code [id number]  Belva Crome, MD Inpatient   07/11/2015 2000 07/12/2015 1524 Full Code [id number]  Reola Mosher Inpatient   06/26/2015 0543 06/27/2015 1530 Full Code [id number]  Juluis Mire, MD Inpatient   Advance Care Planning Activity      TOTAL TIME TAKING CARE OF THIS PATIENT: 40  minutes.    [name] M.D on 09/10/2019 at 10:52 AM  Between 7am to 6pm - Pager - [phone] After 6pm go to [email or web address] - password [email or web address] [facility]  Office  [phone]  CC: Primary care physician; Birdie Sons, MD

## 2019-09-10 NOTE — Progress Notes (Signed)
Patient adequate and agreeable to discharge today. Will let me know when family is here to pick him up. Patient states he will call [facility] on Monday, 09/11/2019. No complaints or questions at this time.

## 2019-09-10 NOTE — Discharge Instructions (Signed)
[facility]  Mental health clinic in [place], [place] Address: [address] [place], [place] [address] Hours:  Closed ? Opens 9AM Mon Phone: [phone]  Pt to f/u with cardiology at [facility]--he reports he will make the appt

## 2019-09-15 ENCOUNTER — Other Ambulatory Visit: Payer: Self-pay | Admitting: Family Medicine

## 2019-09-15 DIAGNOSIS — M5410 Radiculopathy, site unspecified: Secondary | ICD-10-CM

## 2019-09-15 MED ORDER — OXYCODONE-ACETAMINOPHEN 10-325 MG PO TABS: 1.0000 | ORAL_TABLET | ORAL | 0 refills | Status: DC

## 2019-09-15 NOTE — Telephone Encounter (Signed)
Pt needing a refill on:  oxyCODONE-acetaminophen (PERCOCET) 10-325 MG tablet  Please fill at:  [place], [place] - [place] [phone] (Phone) [phone] (Fax)   Thanks, [place]

## 2019-09-18 DIAGNOSIS — J449 Chronic obstructive pulmonary disease, unspecified: Secondary | ICD-10-CM | POA: Diagnosis not present

## 2019-09-18 DIAGNOSIS — J42 Unspecified chronic bronchitis: Secondary | ICD-10-CM | POA: Diagnosis not present

## 2019-09-19 DIAGNOSIS — E119 Type 2 diabetes mellitus without complications: Secondary | ICD-10-CM | POA: Diagnosis not present

## 2019-09-19 DIAGNOSIS — E785 Hyperlipidemia, unspecified: Secondary | ICD-10-CM | POA: Diagnosis not present

## 2019-09-19 DIAGNOSIS — I471 Supraventricular tachycardia: Secondary | ICD-10-CM | POA: Diagnosis not present

## 2019-09-19 DIAGNOSIS — I10 Essential (primary) hypertension: Secondary | ICD-10-CM | POA: Diagnosis not present

## 2019-09-19 DIAGNOSIS — I110 Hypertensive heart disease with heart failure: Secondary | ICD-10-CM | POA: Diagnosis not present

## 2019-09-19 DIAGNOSIS — F17200 Nicotine dependence, unspecified, uncomplicated: Secondary | ICD-10-CM | POA: Diagnosis not present

## 2019-09-19 DIAGNOSIS — I4891 Unspecified atrial fibrillation: Secondary | ICD-10-CM | POA: Diagnosis not present

## 2019-09-19 DIAGNOSIS — I2510 Atherosclerotic heart disease of native coronary artery without angina pectoris: Secondary | ICD-10-CM | POA: Diagnosis not present

## 2019-09-19 DIAGNOSIS — R072 Precordial pain: Secondary | ICD-10-CM | POA: Diagnosis not present

## 2019-09-19 DIAGNOSIS — G8929 Other chronic pain: Secondary | ICD-10-CM | POA: Diagnosis not present

## 2019-09-19 DIAGNOSIS — J449 Chronic obstructive pulmonary disease, unspecified: Secondary | ICD-10-CM | POA: Diagnosis not present

## 2019-09-19 DIAGNOSIS — I5030 Unspecified diastolic (congestive) heart failure: Secondary | ICD-10-CM | POA: Diagnosis not present

## 2019-09-20 IMAGING — CR DG CHEST 2V
2 series · 2 of 2 positions shown · non-contrast
Comparison: 09/30/2017

CLINICAL DATA: Pt reports left side chest pain radiating to left
arm for two days. Pt reports LUQ abdominal pain for over one year.
Pt reports the pain is in his pancreas. Pt reports associated SOB,
nausea and dizzinessHx CABG, cardiac cath x 3, MI, COPD, a-fib.
Former smoker quit in 0488

EXAM:
CHEST  2 VIEW

[chest pa]
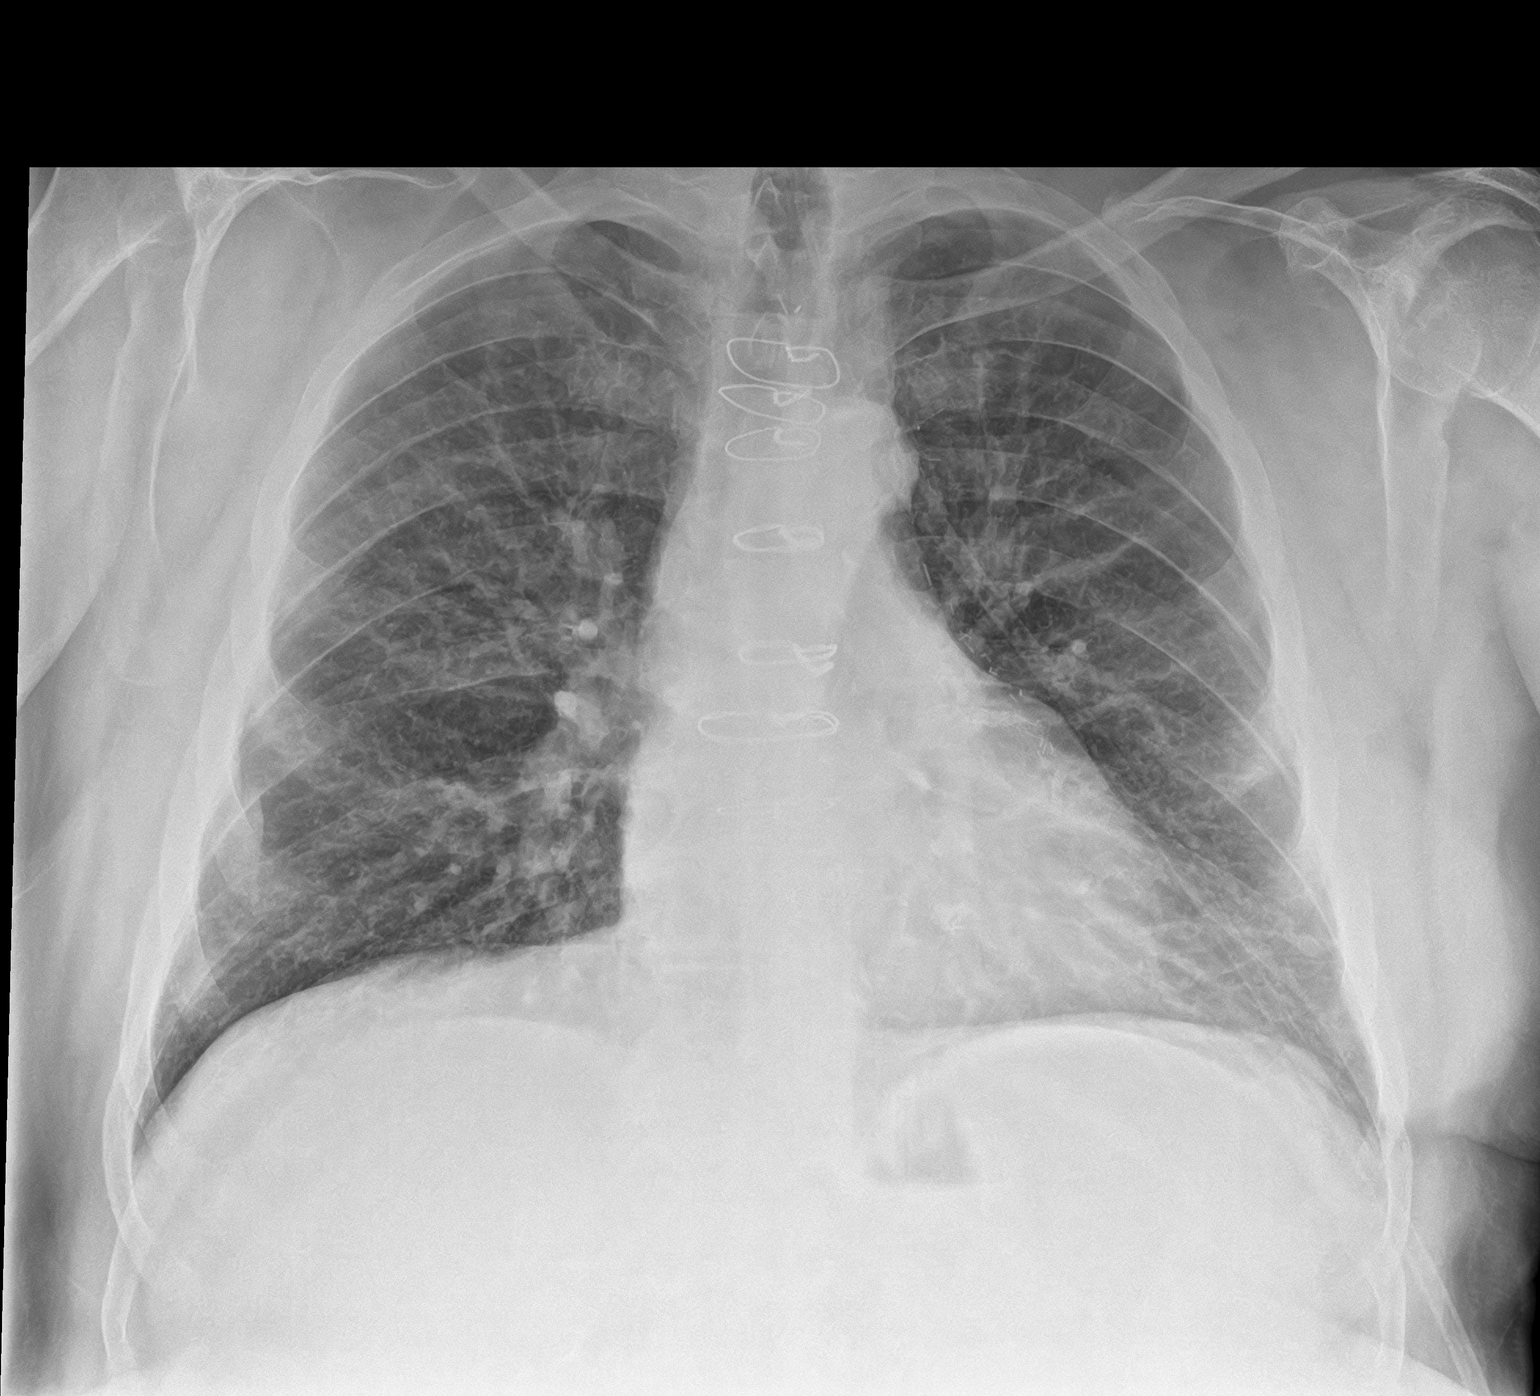

[chest lat]
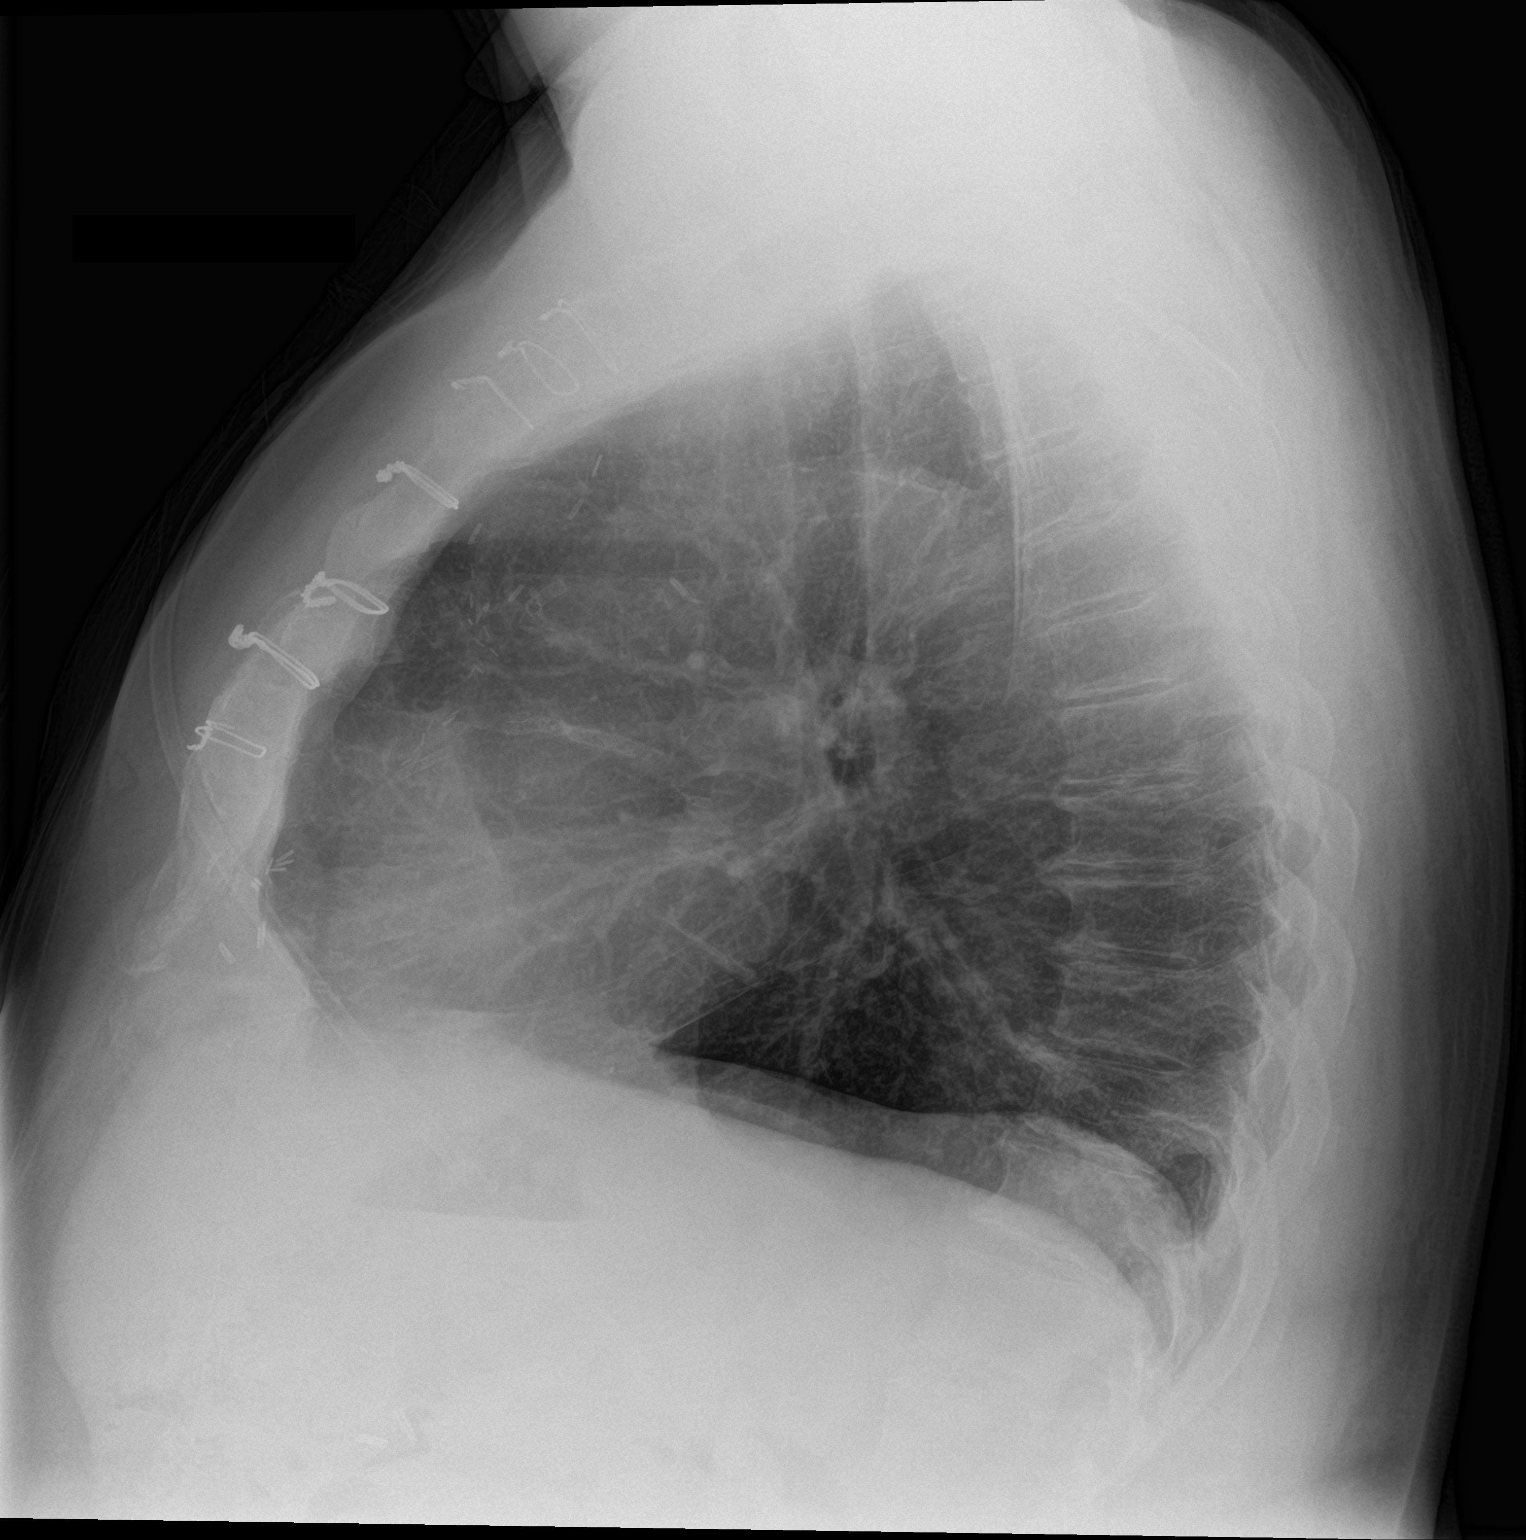

[2 of 2 positions shown; findings below may reference images not displayed]

FINDINGS: Cardiac silhouette is normal size and configuration. Changes from
cardiac surgery, and left coronary stent, are stable. Normal
mediastinal and hilar contours.

There prominent bronchovascular markings. Lungs otherwise clear with
no evidence of pneumonia or pulmonary edema.

No pleural effusion or pneumothorax.

Skeletal structures are demineralized but grossly intact.
IMPRESSION: No acute cardiopulmonary disease.

## 2019-09-29 ENCOUNTER — Encounter: Payer: Self-pay | Admitting: Family Medicine

## 2019-09-29 ENCOUNTER — Other Ambulatory Visit: Payer: Self-pay

## 2019-09-29 ENCOUNTER — Other Ambulatory Visit: Payer: Self-pay | Admitting: Family Medicine

## 2019-09-29 ENCOUNTER — Ambulatory Visit (INDEPENDENT_AMBULATORY_CARE_PROVIDER_SITE_OTHER): Payer: Medicare HMO | Admitting: Family Medicine

## 2019-09-29 VITALS — BP 120/72 | HR 55 | Temp 96.8°F | Resp 16 | Wt 237.0 lb

## 2019-09-29 DIAGNOSIS — E1121 Type 2 diabetes mellitus with diabetic nephropathy: Secondary | ICD-10-CM

## 2019-09-29 DIAGNOSIS — F419 Anxiety disorder, unspecified: Secondary | ICD-10-CM | POA: Diagnosis not present

## 2019-09-29 DIAGNOSIS — I10 Essential (primary) hypertension: Secondary | ICD-10-CM

## 2019-09-29 DIAGNOSIS — I25118 Atherosclerotic heart disease of native coronary artery with other forms of angina pectoris: Secondary | ICD-10-CM

## 2019-09-29 LAB — POCT GLYCOSYLATED HEMOGLOBIN (HGB A1C)
Est. average glucose Bld gHb Est-mCnc: 180
Hemoglobin A1C: 7.9 % — AB (ref 4.0–5.6)

## 2019-09-29 NOTE — Patient Instructions (Signed)
.   Please review the attached list of medications and notify my office if there are any errors.   . Please bring all of your medications to every appointment so we can make sure that our medication list is the same as yours.   . Please contact your eyecare professional to schedule a routine eye exam

## 2019-09-29 NOTE — Progress Notes (Signed)
Patient: Lawrence [name] Male    DOB: Dec 03, 1955   63 y.o.   MRN: [id number] Visit Date: 09/29/2019  Today's Provider: [name], MD   Chief Complaint  Patient presents with  . Diabetes  . Hypertension  . Congestive Heart Failure  . Coronary Artery Disease  . Back Pain   Subjective:     HPI  Diabetes Mellitus Type II, Follow-up:   Lab Results  Component Value Date   HGBA1C 7.9 (A) 05/29/2019   HGBA1C 6.8 10/15/2018   HGBA1C 7.0 (A) 07/27/2018    Last seen for diabetes 4 months ago.  Management since then includes increasing Farxiga from 52m to 162mdaily. He reports good compliance with treatment. He is not having side effects.  Current symptoms include none and have been stable. Home blood sugar records: fasting range: 120  Episodes of hypoglycemia? no   Current insulin regiment: Is not on insulin Most Recent Eye Exam: >1 year ago Weight trend: fluctuating a bit Prior visit with dietician: No Current exercise: walking Current diet habits: well balanced  Pertinent Labs:    Component Value Date/Time   CHOL 110 09/09/2019 0440   CHOL 151 01/10/2018 1041   CHOL 92 05/23/2014 0415   TRIG 175 (H) 09/09/2019 0440   TRIG 109 05/23/2014 0415   HDL 31 (L) 09/09/2019 0440   HDL 38 (L) 01/10/2018 1041   HDL 28 (L) 05/23/2014 0415   LDLCALC 44 09/09/2019 0440   LDLCALC 56 01/10/2018 1041   LDLCALC 42 05/23/2014 0415   CREATININE 1.09 09/08/2019 1950   CREATININE 0.84 03/07/2015 1851    Wt Readings from Last 3 Encounters:  09/29/19 237 lb (107.5 kg)  09/10/19 237 lb 5.9 oz (107.7 kg)  09/04/19 245 lb (111.1 kg)    ------------------------------------------------------------------------   Hypertension, follow-up:  BP Readings from Last 3 Encounters:  09/29/19 120/72  09/10/19 113/65  09/04/19 (!) 147/98    He was last seen for hypertension 4 months ago.  BP at that visit was 136/82. Management since that visit includes no changes.He  reports good compliance with treatment. He is not having side effects.  He is exercising. He is adherent to low salt diet.   Outside blood pressures are checked at home. He is experiencing dyspnea.  Patient denies fatigue and irregular heart beat.   Cardiovascular risk factors include advanced age (older than 5535or men, 6567or women), diabetes mellitus, hypertension and male gender.  Use of agents associated with hypertension: NSAIDS.   ------------------------------------------------------------------------   Follow up for CHF:  The patient was last seen for this 4 months ago. Changes made at last visit include none.  He reports good compliance with treatment. He feels that condition is Unchanged. He is not having side effects.   ------------------------------------------------------------------------------------  Follow up for CAD:  The patient was last seen for this 4 months ago. Changes made at last visit include none.  He reports good compliance with treatment. He feels that condition is Unchanged. He is not having side effects.   ------------------------------------------------------------------------------------  Follow up for Chronic back pain:  The patient was last seen for this 4 months ago. Changes made at last visit include none.  He reports good compliance with treatment. He feels that condition is Unchanged. He is not having side effects.   ------------------------------------------------------------------------------------   Follow up anxiety and depression He reports having much more anxiety lately related to his wife passing in August, his own health issues and  financial difficulties. He continues on citalopram and venlafaxine daily, but alprazolam was changed to clonazepam at recent hospitalization which he feels is working better for him. He also reports his landlord is requiring him to get rid of his dog which he finds to be a source of great  comfort helping with his anxiety.  .  Allergies  Allergen Reactions  . Demerol  [Meperidine Hcl]   . Demerol [Meperidine] Hives  . Jardiance [Empagliflozin] Other (See Comments)    Perineal pain  . Prednisone Other (See Comments) and Hypertension    Pt states that this medication puts him in A-fib   . Sulfa Antibiotics Hives  . Albuterol Sulfate [Albuterol] Palpitations and Other (See Comments)    Pt currently uses this medication.    . Morphine Sulfate Nausea And Vomiting, Rash and Other (See Comments)    Pt states that he is only allergic to the tablet form of this medication.       Current Outpatient Medications:  .  Albuterol Sulfate, sensor, 108 (90 Base) MCG/ACT AEPB, Inhale 2 puffs into the lungs at bedtime. AND PRN, Disp: , Rfl:  .  allopurinol (ZYLOPRIM) 300 MG tablet, Take 1 tablet (300 mg total) by mouth 2 (two) times daily., Disp: 180 tablet, Rfl: 4 .  arformoterol (BROVANA) 15 MCG/2ML NEBU, Take 15 mcg by nebulization 2 (two) times daily., Disp: , Rfl:  .  aspirin 81 MG chewable tablet, Chew 81 mg by mouth daily., Disp: , Rfl:  .  atorvastatin (LIPITOR) 80 MG tablet, TAKE ONE TABLET BY MOUTH AT BEDTIME. (Patient taking differently: Take 80 mg by mouth at bedtime. ), Disp: 90 tablet, Rfl: 4 .  Blood Glucose Monitoring Suppl (ACCU-CHEK AVIVA PLUS) w/Device KIT, CHECK BLOOD SUGAR EVERY DAY, Disp: 1 kit, Rfl: 0 .  budesonide (PULMICORT) 0.5 MG/2ML nebulizer solution, Take 0.5 mg by nebulization 2 (two) times daily., Disp: , Rfl:  .  cetirizine (ZYRTEC) 10 MG tablet, TAKE ONE TABLET BY MOUTH AT BEDTIME, Disp: 30 tablet, Rfl: 6 .  Cholecalciferol (VITAMIN D3) 125 MCG (5000 UT) CAPS, Take 5,000 Units by mouth daily., Disp: , Rfl:  .  citalopram (CELEXA) 20 MG tablet, Take 20 mg by mouth daily., Disp: , Rfl:  .  clonazePAM (KLONOPIN) 0.5 MG tablet, Take 1 tablet (0.5 mg total) by mouth 2 (two) times daily., Disp: 30 tablet, Rfl: 0 .  dapagliflozin propanediol (FARXIGA) 10 MG TABS  tablet, Take 10 mg by mouth daily., Disp: , Rfl:  .  docusate sodium (COLACE) 100 MG capsule, Take 100 mg by mouth daily. , Disp: , Rfl:  .  ELIQUIS 5 MG TABS tablet, TAKE 1 TABLET BY MOUTH TWICE A DAY, Disp: 60 tablet, Rfl: 1 .  fluticasone furoate-vilanterol (BREO ELLIPTA) 100-25 MCG/INH AEPB, Inhale 1 puff into the lungs daily., Disp: , Rfl:  .  glucose blood (ACCU-CHEK AVIVA PLUS) test strip, Use to check blood sugar once a day, Disp: 100 each, Rfl: 4 .  isosorbide mononitrate (IMDUR) 120 MG 24 hr tablet, Take 1 tablet (120 mg total) by mouth daily., Disp: 30 tablet, Rfl: 5 .  LINZESS 72 MCG capsule, Take 72 mcg by mouth daily before breakfast. , Disp: , Rfl:  .  lisinopril (ZESTRIL) 10 MG tablet, Take 10 mg by mouth daily., Disp: , Rfl:  .  magnesium oxide (MAG-OX) 400 (241.3 Mg) MG tablet, Take 1 tablet (400 mg total) by mouth every morning., Disp: 30 tablet, Rfl: 5 .  nitroGLYCERIN (NITROSTAT) 0.4 MG  SL tablet, Place 1 tablet (0.4 mg total) under the tongue every 5 (five) minutes as needed for chest pain. Reported on 05/26/2016, Disp: 30 tablet, Rfl: 3 .  omega-3 acid ethyl esters (LOVAZA) 1 g capsule, Take 4 g by mouth daily., Disp: , Rfl:  .  omeprazole (PRILOSEC) 40 MG capsule, TAKE 1 CAPSULE BY MOUTH TWICE DAILY 30 MINUTES PRIOR TO BREAKFAST ANDDINNER., Disp: , Rfl:  .  oxyCODONE-acetaminophen (PERCOCET) 10-325 MG tablet, Take 1 tablet by mouth every 4 (four) hours., Disp: 150 tablet, Rfl: 0 .  OXYGEN, Inhale 2 L into the lungs continuous. , Disp: , Rfl:  .  ranolazine (RANEXA) 500 MG 12 hr tablet, Take 2 tablets (1,000 mg total) by mouth 2 (two) times daily., Disp: 60 tablet, Rfl: 1 .  sotalol (BETAPACE) 80 MG tablet, Take 80 mg by mouth 2 (two) times daily., Disp: , Rfl:  .  tiotropium (SPIRIVA) 18 MCG inhalation capsule, Place 18 mcg into inhaler and inhale daily., Disp: , Rfl:  .  torsemide (DEMADEX) 100 MG tablet, Take 50 mg by mouth daily. , Disp: , Rfl:  .  traZODone (DESYREL) 100  MG tablet, Take 100 mg by mouth at bedtime., Disp: , Rfl:  .  venlafaxine XR (EFFEXOR-XR) 75 MG 24 hr capsule, Take 75 mg by mouth daily with breakfast., Disp: , Rfl:  .  methocarbamol (ROBAXIN) 500 MG tablet, Take 1 tablet by mouth QID., Disp: , Rfl:   Review of Systems  Constitutional: Negative for appetite change, chills and fever.  Respiratory: Negative for chest tightness, shortness of breath and wheezing.   Cardiovascular: Negative for chest pain and palpitations.  Gastrointestinal: Negative for abdominal pain, nausea and vomiting.    Social History   Tobacco Use  . Smoking status: Former Smoker    Packs/day: 3.00    Years: 50.00    Pack years: 150.00    Types: Cigarettes    Quit date: 04/22/2013    Years since quitting: 6.4  . Smokeless tobacco: Never Used  Substance Use Topics  . Alcohol use: No    Comment: remotely quit alcohol use. Hx of heavy alcohol use.      Objective:   BP 120/72 (BP Location: Left Arm, Patient Position: Sitting, Cuff Size: Large)   Pulse (!) 55   Temp (!) 96.8 F (36 C) (Temporal)   Resp 16   Wt 237 lb (107.5 kg)   SpO2 96%   BMI 37.12 kg/m  Vitals:   09/29/19 1451  BP: 120/72  Pulse: (!) 55  Resp: 16  Temp: (!) 96.8 F (36 C)  TempSrc: Temporal  SpO2: 96%  Weight: 237 lb (107.5 kg)  Body mass index is 37.12 kg/m.   Physical Exam   General Appearance:    Obese male in no acute distress  Eyes:    PERRL, conjunctiva/corneas clear, EOM's intact       Lungs:     Clear to auscultation bilaterally, respirations unlabored  Heart:    Bradycardic. Regular rhythm. No murmurs, rubs, or gallops.   MS:   All extremities are intact.   Neurologic:   Awake, alert, oriented x 3. No apparent focal neurological           defect.        Results for orders placed or performed in visit on 09/29/19  POCT HgB A1C  Result Value Ref Range   Hemoglobin A1C 7.9 (A) 4.0 - 5.6 %   Est. average glucose Bld gHb Est-mCnc  180        Assessment & Plan     1. Type 2 diabetes mellitus with diabetic nephropathy, without long-term current use of insulin (Lawrence [name]) Doing well with increased dose of [place]. Continue current medications.    2. Essential hypertension Well controlled.  Continue current medications.    3. Anxiety Has continue venlafaxine and citalopram. Was changed from alprazolam to clonazepam at recent hospitalization which he feels is more effective and will continue.  His anxiety is improved with presence of his dog who serves as emotional support animal. Will write letter for his landlord saying such.   4. Coronary artery disease involving native coronary artery of native heart with other form of angina pectoris ([place]) Asymptomatic. Compliant with medication.  Continue aggressive risk factor modification.  He reports he has follow up scheduled with Dr. Lajean [name] at [facility] instead of Dr. [name] who has managed his heart disease in the past. Advised he would need his cardiologist to refill cardiac medications.   Future Appointments  Date Time Provider Department Center  01/02/2020  2:40 PM Marjoria Mancillas, [name], MD [name]       [name], MD  [facility]

## 2019-10-02 ENCOUNTER — Telehealth: Payer: Self-pay

## 2019-10-02 NOTE — Telephone Encounter (Signed)
   Reason for CRM: pt called in and stated that he was seen on 11/13 and Dr [name] was going to write a note for pt to have an emotional support animal and he just wanted to check the status of that note? He would like to come by the office and pick it up .  Best call back number - [phone]

## 2019-10-03 ENCOUNTER — Other Ambulatory Visit: Payer: Self-pay

## 2019-10-03 ENCOUNTER — Encounter: Payer: Self-pay | Admitting: Emergency Medicine

## 2019-10-03 ENCOUNTER — Emergency Department
Admission: EM | Admit: 2019-10-03 | Discharge: 2019-10-04 | Disposition: A | Discharge status: Home / Self Care | Payer: Medicare HMO | Attending: Student | Admitting: Student

## 2019-10-03 DIAGNOSIS — J45909 Unspecified asthma, uncomplicated: Secondary | ICD-10-CM | POA: Diagnosis not present

## 2019-10-03 DIAGNOSIS — Z20828 Contact with and (suspected) exposure to other viral communicable diseases: Secondary | ICD-10-CM | POA: Diagnosis not present

## 2019-10-03 DIAGNOSIS — J449 Chronic obstructive pulmonary disease, unspecified: Secondary | ICD-10-CM | POA: Insufficient documentation

## 2019-10-03 DIAGNOSIS — I2510 Atherosclerotic heart disease of native coronary artery without angina pectoris: Secondary | ICD-10-CM | POA: Insufficient documentation

## 2019-10-03 DIAGNOSIS — I252 Old myocardial infarction: Secondary | ICD-10-CM | POA: Diagnosis not present

## 2019-10-03 DIAGNOSIS — R52 Pain, unspecified: Secondary | ICD-10-CM

## 2019-10-03 DIAGNOSIS — E1122 Type 2 diabetes mellitus with diabetic chronic kidney disease: Secondary | ICD-10-CM | POA: Diagnosis not present

## 2019-10-03 DIAGNOSIS — I5032 Chronic diastolic (congestive) heart failure: Secondary | ICD-10-CM | POA: Insufficient documentation

## 2019-10-03 DIAGNOSIS — Z87891 Personal history of nicotine dependence: Secondary | ICD-10-CM | POA: Diagnosis not present

## 2019-10-03 DIAGNOSIS — N182 Chronic kidney disease, stage 2 (mild): Secondary | ICD-10-CM | POA: Insufficient documentation

## 2019-10-03 DIAGNOSIS — R509 Fever, unspecified: Secondary | ICD-10-CM | POA: Diagnosis not present

## 2019-10-03 DIAGNOSIS — Z79899 Other long term (current) drug therapy: Secondary | ICD-10-CM | POA: Diagnosis not present

## 2019-10-03 DIAGNOSIS — I130 Hypertensive heart and chronic kidney disease with heart failure and stage 1 through stage 4 chronic kidney disease, or unspecified chronic kidney disease: Secondary | ICD-10-CM | POA: Insufficient documentation

## 2019-10-03 DIAGNOSIS — M791 Myalgia, unspecified site: Secondary | ICD-10-CM | POA: Insufficient documentation

## 2019-10-03 DIAGNOSIS — Z7901 Long term (current) use of anticoagulants: Secondary | ICD-10-CM | POA: Insufficient documentation

## 2019-10-03 DIAGNOSIS — Z7982 Long term (current) use of aspirin: Secondary | ICD-10-CM | POA: Diagnosis not present

## 2019-10-03 DIAGNOSIS — R197 Diarrhea, unspecified: Secondary | ICD-10-CM | POA: Insufficient documentation

## 2019-10-03 DIAGNOSIS — R079 Chest pain, unspecified: Secondary | ICD-10-CM | POA: Diagnosis not present

## 2019-10-03 LAB — COMPREHENSIVE METABOLIC PANEL
ALT: 20 U/L (ref 0–44)
AST: 18 U/L (ref 15–41)
Albumin: 4.1 g/dL (ref 3.5–5.0)
Alkaline Phosphatase: 73 U/L (ref 38–126)
Anion gap: 11 (ref 5–15)
BUN: 17 mg/dL (ref 8–23)
CO2: 26 mmol/L (ref 22–32)
Calcium: 9.5 mg/dL (ref 8.9–10.3)
Chloride: 101 mmol/L (ref 98–111)
Creatinine, Ser: 1.19 mg/dL (ref 0.61–1.24)
GFR calc Af Amer: >60 mL/min (ref >60)
GFR calc non Af Amer: >60 mL/min (ref >60)
Glucose, Bld: 128 mg/dL — ABNORMAL HIGH (ref 70–99)
Potassium: 4.5 mmol/L (ref 3.5–5.1)
Sodium: 138 mmol/L (ref 135–145)
Total Bilirubin: 1.2 mg/dL (ref 0.3–1.2)
Total Protein: 7.3 g/dL (ref 6.5–8.1)

## 2019-10-03 LAB — CBC
HCT: 57.8 % — ABNORMAL HIGH (ref 39.0–52.0)
Hemoglobin: 18.3 g/dL — ABNORMAL HIGH (ref 13.0–17.0)
MCH: 27.2 pg (ref 26.0–34.0)
MCHC: 31.7 g/dL (ref 30.0–36.0)
MCV: 85.8 fL (ref 80.0–100.0)
Platelets: 165 10*3/uL (ref 150–400)
RBC: 6.74 MIL/uL — ABNORMAL HIGH (ref 4.22–5.81)
RDW: 17.8 % — ABNORMAL HIGH (ref 11.5–15.5)
WBC: 7.5 10*3/uL (ref 4.0–10.5)
nRBC: 0 % (ref 0.0–0.2)

## 2019-10-03 LAB — URINALYSIS, COMPLETE (UACMP) WITH MICROSCOPIC
Bacteria, UA: NONE SEEN
Bilirubin Urine: NEGATIVE
Glucose, UA: >=500 mg/dL — AB
Hgb urine dipstick: NEGATIVE
Ketones, ur: NEGATIVE mg/dL
Leukocytes,Ua: NEGATIVE
Nitrite: NEGATIVE
Protein, ur: 100 mg/dL — AB
Specific Gravity, Urine: 1.037 — ABNORMAL HIGH (ref 1.005–1.030)
pH: 5 (ref 5.0–8.0)

## 2019-10-03 LAB — LIPASE, BLOOD: Lipase: 23 U/L (ref 11–51)

## 2019-10-03 NOTE — ED Provider Notes (Signed)
[facility] Emergency Department Provider Note  ____________________________________________   First MD Initiated Contact with Patient 10/03/19 2356     (approximate)  I have reviewed the triage vital signs and the nursing notes.  History  Chief Complaint Diarrhea and Generalized Body Aches    HPI Lawrence [name] is a 63 y.o. male with history as below, notable for CAD, AF, COPD, DM who presents to the ED for subjective fever to 99.1, diarrhea, and generalized body aches.  Patient reports his symptoms all started this morning.  He reports 4-5 episodes of diarrhea, worsened with eating.  He states anytime he puts anything in his stomach he needs to use the restroom.  No nausea or vomiting.  He has generalized body aches/soreness, mild in severity.  He does report some of this pain in his chest area, central/left-sided.  He did take nitro and 4 baby aspirin prior to arrival.  He reports a decreased appetite.  He denies any sick contacts or known exposure to anyone with coronavirus.   Past Medical Hx Past Medical History:  Diagnosis Date  . A-fib ([place])   . Anemia   . Anginal pain ([place])   . Anxiety   . Arthritis   . Asthma   . CAD (coronary artery disease)    a. 2002 CABGx2 (LIMA->LAD, VG->VG->OM1);  b. 09/2012 DES->OM;  c. 03/2015 PTCA of LAD [facility]) in setting of atretic LIMA; d. 05/2015 Cath [facility]): nonobs dzs; e. 06/2015 Cath ([facility]): LM nl, LAD 45p/d ISR, 50d, D1/2 small, LCX 50p/d ISR, OM1 70ost, 30 ISR, VG->OM1 50ost, 45m LIMA->LAD 99p/d - atretic, RCA dom, nl; f.cath 10/16: 40-50%(FFR 0.90) pLAD, 75% (FFR 0.77) mLAD s/p PCI/DES, oRCA 40% (FFR0.95)  . Cancer ([place]    SKIN CANCER ON BACK  . Celiac disease   . Chronic diastolic CHF (congestive heart failure) ([place]    a. 06/2009 Echo: EF 60-65%, Gr 1 DD, triv AI, mildly dil LA, nl RV.  .Marland KitchenCOPD (chronic obstructive pulmonary disease) ([place]    a. Chronic bronchitis and emphysema.  . DDD (degenerative disc  disease), lumbar   . Diverticulosis   . Dysrhythmia   . Essential hypertension   . GERD (gastroesophageal reflux disease)   . History of hiatal hernia   . History of kidney stones    H/O  . History of tobacco abuse    a. Quit 2014.  .Marland KitchenMyocardial infarction (HR[place] 2002   4 STENTS  . Pancreatitis   . PSVT (paroxysmal supraventricular tachycardia) ([place]    a. 10/2012 Noted on Zio Patch.  . Sleep apnea    LOST CORD TO CPAP -ONLY 02 @ BEDTIME  . Tubular adenoma of colon   . Type II diabetes mellitus ([place]     Problem List Patient Active Problem List   Diagnosis Date Noted  . Bereavement 09/09/2019  . Chronic respiratory failure with hypoxia (HF[place] 05/29/2019  . Morbid obesity (HB[place] 05/29/2019  . Trigger thumb of right hand 11/28/2018  . Eye pain, left 08/18/2018  . Acute on chronic heart failure ([place] 04/18/2018  . History of adenomatous polyp of colon 04/05/2018  . Abdominal pain, chronic, epigastric 11/06/2017  . Bilateral flank pain 03/24/2017  . Dyspnea 04/03/2016  . Hypotension 04/03/2016  . CKD (chronic kidney disease) stage 2, GFR 60-89 ml/min 04/03/2016  . Anemia 04/03/2016  . Paroxysmal atrial fibrillation (HD[place] 12/23/2015  . OSA (obstructive sleep apnea) 12/10/2015  . Left inguinal hernia 11/07/2015  . Anxiety 11/07/2015  .  Unstable angina ([place]) 10/05/2015  . Back pain with left-sided radiculopathy 09/30/2015  . Nocturnal hypoxia 09/06/2015  . BPH (benign prostatic hyperplasia) 08/01/2015  . Chronic diastolic CHF (congestive heart failure) ([place])   . Angina pectoris ([place])   . Chest pain 07/11/2015  . COPD (chronic obstructive pulmonary disease) ([place]) 07/03/2015  . CAD (coronary artery disease) 06/26/2015  . HTN (hypertension) 06/26/2015  . 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[place]) 06/26/2015  . Achalasia 07/24/2014  . GERD (gastroesophageal reflux disease) 06/07/2014  . Former tobacco use 04/11/2013  . HLD (hyperlipidemia) 04/09/2013    Past  Surgical Hx Past Surgical History:  Procedure Laterality Date  . BYPASS GRAFT    . CARDIAC CATHETERIZATION N/A 07/12/2015   rocedure: Left Heart Cath and Cors/Grafts Angiography;  Surgeon: Belva Crome, MD;  Location: [place] CV LAB;  Service: Cardiovascular;  Laterality: N/A;  . CARDIAC CATHETERIZATION Right 10/07/2015   Procedure: Left Heart Cath and Cors/Grafts Angiography;  Surgeon: Dionisio David, MD;  Location: [place] CV LAB;  Service: Cardiovascular;  Laterality: Right;  . CARDIAC CATHETERIZATION N/A 04/06/2016   Procedure: Left Heart Cath and Coronary Angiography;  Surgeon: Yolonda Kida, MD;  Location: [place] CV LAB;  Service: Cardiovascular;  Laterality: N/A;  . CARDIAC CATHETERIZATION  04/06/2016   Procedure: Bypass Graft Angiography;  Surgeon: Yolonda Kida, MD;  Location: [place] CV LAB;  Service: Cardiovascular;;  . CARDIAC CATHETERIZATION N/A 11/02/2016   Procedure: Left Heart Cath and Cors/Grafts Angiography and possible PCI;  Surgeon: Yolonda Kida, MD;  Location: [place] CV LAB;  Service: Cardiovascular;  Laterality: N/A;  . CARDIAC CATHETERIZATION N/A 11/02/2016   Procedure: Coronary Stent Intervention;  Surgeon: Yolonda Kida, MD;  Location: [place] CV LAB;  Service: Cardiovascular;  Laterality: N/A;  . CHOLECYSTECTOMY    . CIRCUMCISION N/A 06/09/2019   Procedure: CIRCUMCISION ADULT;  Surgeon: Billey Co, MD;  Location: [facility] ORS;  Service: Urology;  Laterality: N/A;  . COLONOSCOPY WITH PROPOFOL N/A 04/01/2018   Procedure: COLONOSCOPY WITH PROPOFOL;  Surgeon: Manya Silvas, MD;  Location[facility] ENDOSCOPY;  Service: Endoscopy;  Laterality: N/A;  . ESOPHAGEAL DILATION    . ESOPHAGOGASTRODUODENOSCOPY (EGD) WITH PROPOFOL N/A 04/01/2018   Procedure: ESOPHAGOGASTRODUODENOSCOPY (EGD) WITH PROPOFOL;  Surgeon: Manya Silvas, MD;  Location: [facility] ENDOSCOPY;  Service: Endoscopy;  Laterality: N/A;  . LEFT HEART CATH AND  CORS/GRAFTS ANGIOGRAPHY N/A 06/12/2019   Procedure: LEFT HEART CATH AND CORS/GRAFTS ANGIOGRAPHY;  Surgeon: Teodoro Spray, MD;  Location: [place] CV LAB;  Service: Cardiovascular;  Laterality: N/A;  . TONSILLECTOMY    . VASCULAR SURGERY      Medications Prior to Admission medications   Medication Sig Start Date End Date Taking? Authorizing Provider  Albuterol Sulfate, sensor, 108 (90 Base) MCG/ACT AEPB Inhale 2 puffs into the lungs at bedtime. AND PRN    [provider]  allopurinol (ZYLOPRIM) 300 MG tablet Take 1 tablet (300 mg total) by mouth 2 (two) times daily. 10/05/18   Birdie Sons, MD  arformoterol (BROVANA) 15 MCG/2ML NEBU Take 15 mcg by nebulization 2 (two) times daily.    [provider]  aspirin 81 MG chewable tablet Chew 81 mg by mouth daily.    [provider]  atorvastatin (LIPITOR) 80 MG tablet TAKE ONE TABLET BY MOUTH AT BEDTIME. Patient taking differently: Take 80 mg by mouth at bedtime.  09/19/18   Birdie Sons, MD  Blood Glucose Monitoring Suppl (ACCU-CHEK  AVIVA PLUS) w/Device KIT CHECK BLOOD SUGAR EVERY DAY 12/08/17   Birdie Sons, MD  budesonide (PULMICORT) 0.5 MG/2ML nebulizer solution Take 0.5 mg by nebulization 2 (two) times daily.    [provider]  cetirizine (ZYRTEC) 10 MG tablet TAKE ONE TABLET BY MOUTH AT BEDTIME 08/01/19   Birdie Sons, MD  Cholecalciferol (VITAMIN D3) 125 MCG (5000 UT) CAPS Take 5,000 Units by mouth daily.    [provider]  citalopram (CELEXA) 20 MG tablet Take 20 mg by mouth daily.    [provider]  clonazePAM (KLONOPIN) 0.5 MG tablet TAKE 1 TABLET BY MOUTH TWICE A DAY 09/29/19   Birdie Sons, MD  dapagliflozin propanediol (FARXIGA) 10 MG TABS tablet Take 10 mg by mouth daily.    [provider]  docusate sodium (COLACE) 100 MG capsule Take 100 mg by mouth daily.     [provider]  ELIQUIS 5 MG TABS tablet TAKE 1 TABLET BY MOUTH TWICE A DAY  08/10/19   Birdie Sons, MD  fluticasone furoate-vilanterol (BREO ELLIPTA) 100-25 MCG/INH AEPB Inhale 1 puff into the lungs daily.    [provider]  glucose blood (ACCU-CHEK AVIVA PLUS) test strip Use to check blood sugar once a day 02/14/18   Birdie Sons, MD  isosorbide mononitrate (IMDUR) 120 MG 24 hr tablet Take 1 tablet (120 mg total) by mouth daily. 08/10/19   Birdie Sons, MD  LINZESS 72 MCG capsule Take 72 mcg by mouth daily before breakfast.     [provider]  lisinopril (ZESTRIL) 10 MG tablet Take 10 mg by mouth daily.    [provider]  magnesium oxide (MAG-OX) 400 (241.3 Mg) MG tablet Take 1 tablet (400 mg total) by mouth every morning. 06/13/19   Alisa Graff, FNP  methocarbamol (ROBAXIN) 500 MG tablet Take 1 tablet by mouth QID.    [provider]  nitroGLYCERIN (NITROSTAT) 0.4 MG SL tablet Place 1 tablet (0.4 mg total) under the tongue every 5 (five) minutes as needed for chest pain. Reported on 05/26/2016 11/28/18   Birdie Sons, MD  omega-3 acid ethyl esters (LOVAZA) 1 g capsule Take 4 g by mouth daily.    [provider]  omeprazole (PRILOSEC) 40 MG capsule TAKE 1 CAPSULE BY MOUTH TWICE DAILY 30 MINUTES PRIOR TO BREAKFAST ANDDINNER. 08/28/19   [provider]  oxyCODONE-acetaminophen (PERCOCET) 10-325 MG tablet Take 1 tablet by mouth every 4 (four) hours. 09/15/19   Birdie Sons, MD  OXYGEN Inhale 2 L into the lungs continuous.     [provider]  ranolazine (RANEXA) 500 MG 12 hr tablet Take 2 tablets (1,000 mg total) by mouth 2 (two) times daily. 09/10/19   [name], MD  sotalol (BETAPACE) 80 MG tablet Take 80 mg by mouth 2 (two) times daily.    [provider]  tiotropium (SPIRIVA) 18 MCG inhalation capsule Place 18 mcg into inhaler and inhale daily.    [provider]  torsemide (DEMADEX) 100 MG tablet Take 50 mg by mouth daily.  10/11/17   [provider]   traZODone (DESYREL) 100 MG tablet Take 100 mg by mouth at bedtime.    [provider]  venlafaxine XR (EFFEXOR-XR) 75 MG 24 hr capsule Take 75 mg by mouth daily with breakfast.    [provider]    Allergies Demerol  [meperidine hcl], Demerol [meperidine], Jardiance [empagliflozin], Prednisone, Sulfa antibiotics, Albuterol sulfate [albuterol], and Morphine  sulfate  Family Hx Family History  Problem Relation Age of Onset  . Heart attack Mother   . Depression Mother   . Heart disease Mother   . COPD Mother   . Hypertension Mother   . Heart attack Father   . Diabetes Father   . Depression Father   . Heart disease Father   . Cirrhosis Father   . Parkinson's disease Brother     Social Hx Social History   Tobacco Use  . Smoking status: Former Smoker    Packs/day: 3.00    Years: 50.00    Pack years: 150.00    Types: Cigarettes    Quit date: 04/22/2013    Years since quitting: 6.4  . Smokeless tobacco: Never Used  Substance Use Topics  . Alcohol use: No    Comment: remotely quit alcohol use. Hx of heavy alcohol use.  . Drug use: No     Review of Systems  Constitutional: + fevers, generalized body aches Eyes: Negative for visual changes. ENT: Negative for sore throat. Cardiovascular: + for chest pain. Respiratory: Negative for shortness of breath. Gastrointestinal: + diarrhea Genitourinary: Negative for dysuria. Musculoskeletal: Negative for leg swelling. Skin: Negative for rash. Neurological: Negative for for headaches.   Physical Exam  Vital Signs: ED Triage Vitals  Enc Vitals Group     BP 10/03/19 1959 (!) 173/98     Pulse Rate 10/03/19 1959 67     Resp 10/03/19 1959 20     Temp 10/03/19 1959 97.8 F (36.6 C)     Temp Source 10/03/19 1959 Oral     SpO2 10/03/19 1959 96 %     Weight 10/03/19 2000 237 lb (107.5 kg)     Height 10/03/19 2000 _0  (1.702 m)     Head Circumference --      Peak Flow --      Pain Score 10/03/19 2000 9      Pain Loc --      Pain Edu? --      Excl. in [place]? --     Constitutional: Alert and oriented.  Appears older than stated age, chronically ill-appearing. Head: Normocephalic. Atraumatic. Eyes: Conjunctivae clear. Sclera anicteric. Nose: No congestion. No rhinorrhea. Mouth/Throat: Wearing mask.  Neck: No stridor.   Cardiovascular: Normal rate, regular rhythm. Extremities well perfused. Respiratory: Normal respiratory effort.  Lungs CTAB. Gastrointestinal: Soft. Non-tender. Non-distended.  Musculoskeletal: No lower extremity edema. No deformities. Neurologic:  Normal speech and language. No gross focal neurologic deficits are appreciated.  Skin: Skin is warm, dry and intact. No rash noted. Psychiatric: Mood and affect are appropriate for situation.  EKG  Personally reviewed.   Rate: 64 Rhythm: sinus Axis: normal Intervals: WNL Nonspecific T wave abnormalities laterally Appears grossly unchanged compared to prior No acute ischemia No STEMI    Radiology  XR: IMPRESSION:  Vague opacity throughout the lingula, favor scarring.   No definite acute cardiopulmonary disease.    Procedures  Procedure(s) performed (including critical care):  Procedures   Initial Impression / Assessment and Plan / ED Course  63 y.o. male who presents to the ED for subjective fever, generalized body aches, diarrhea.  Ddx: COVID, gastroenteritis, viral illness  Will obtain labs, EKG, imaging  Labs w/o actionable derangements. EKG unchanged from prior and troponin x 2 negative. COVID sent and pending at this time. Patient understands need for quarantine/adequate hygiene/social distancing until results return. Otherwise advise supportive care, outpatient follow up. He is agreeable with plan. Given return precautions.  Final Clinical Impression(s) / ED Diagnosis  Final diagnoses:  Diarrhea in adult patient  Generalized body aches       Note:  This document was prepared using Dragon  voice recognition software and may include unintentional dictation errors.   Lilia Pro., MD 10/04/19 [phone]

## 2019-10-03 NOTE — ED Triage Notes (Signed)
Patient to ER for c/o "fever of 99.1", diarrhea, and generalized body ache since this am.

## 2019-10-03 NOTE — Telephone Encounter (Signed)
Will be ready by thursday

## 2019-10-04 ENCOUNTER — Emergency Department: Payer: Medicare HMO

## 2019-10-04 DIAGNOSIS — R509 Fever, unspecified: Secondary | ICD-10-CM | POA: Diagnosis not present

## 2019-10-04 DIAGNOSIS — R079 Chest pain, unspecified: Secondary | ICD-10-CM | POA: Diagnosis not present

## 2019-10-04 DIAGNOSIS — R197 Diarrhea, unspecified: Secondary | ICD-10-CM | POA: Diagnosis not present

## 2019-10-04 LAB — TROPONIN I (HIGH SENSITIVITY)
Troponin I (High Sensitivity): 7 ng/L (ref <18)
Troponin I (High Sensitivity): 8 ng/L (ref <18)

## 2019-10-04 LAB — SARS CORONAVIRUS 2 (TAT 6-24 HRS): SARS Coronavirus 2: NEGATIVE

## 2019-10-04 MED ORDER — OXYCODONE HCL 5 MG PO TABS
5.0000 mg | ORAL_TABLET | Freq: Once | ORAL | Status: AC
  Administered 2019-10-04: 5 mg via ORAL
  Filled 2019-10-04: qty 1

## 2019-10-04 MED ORDER — ACETAMINOPHEN 500 MG PO TABS
1000.0000 mg | ORAL_TABLET | Freq: Once | ORAL | Status: AC
  Administered 2019-10-04: 1000 mg via ORAL
  Filled 2019-10-04: qty 2

## 2019-10-04 NOTE — ED Notes (Signed)
ED Provider at bedside. 

## 2019-10-04 NOTE — Discharge Instructions (Signed)
Thank you for letting us take care of you in the emergency department.   At this time, your coronavirus swab results are pending. You should hear your results in 1-2 days if they are positive.   In the meantime, it is important to take precautions in case you are positive. This includes quarantining, wearing a mask, and social distancing.   Take over the counter acetaminophen as directed on the box to help with fevers as well as aches and pains. Please continue to take any regular, prescribed medications.   Please return to the ER for any new or worsening symptoms, such as difficulty breathing, vomiting and diarrhea, or chest pain.

## 2019-10-04 NOTE — Telephone Encounter (Signed)
Patient advised.

## 2019-10-09 ENCOUNTER — Other Ambulatory Visit: Payer: Self-pay | Admitting: Family Medicine

## 2019-10-09 NOTE — Telephone Encounter (Signed)
Requested medication (s) are due for refill today: yes  Requested medication (s) are on the active medication list: yes  Last refill:  08/28/2019  Future visit scheduled:yes  Notes to clinic:  Last filled by historical provider  Refill cannot be delegated    Requested Prescriptions  Pending Prescriptions Disp Refills   methocarbamol (ROBAXIN) 500 MG tablet [Pharmacy Med Name: METHOCARBAMOL 500 MG TAB] 60 tablet     Sig: TAKE 1 TABLET BY MOUTH 4 TIMES DAILY     Not Delegated - Analgesics:  Muscle Relaxants Failed - 10/09/2019 11:50 AM      Failed - This refill cannot be delegated      Passed - Valid encounter within last 6 months    Recent Outpatient Visits          1 week ago Type 2 diabetes mellitus with diabetic nephropathy, without long-term current use of insulin ([place])   [facility] Birdie Sons, MD   3 months ago Angina pectoris [facility])   [facility] Birdie Sons, MD   4 months ago Type 2 diabetes mellitus with diabetic nephropathy, without long-term current use of insulin [facility])   [facility] Birdie Sons, MD   6 months ago Insomnia, unspecified type   [facility] Birdie Sons, MD   10 months ago Type 2 diabetes mellitus with diabetic nephropathy, without long-term current use of insulin [facility])   [facility] Birdie Sons, MD      Future Appointments            In 2 months [name], [name], MD [facility], [place]

## 2019-10-10 ENCOUNTER — Other Ambulatory Visit: Payer: Self-pay | Admitting: Family Medicine

## 2019-10-10 DIAGNOSIS — M5410 Radiculopathy, site unspecified: Secondary | ICD-10-CM

## 2019-10-10 MED ORDER — OXYCODONE-ACETAMINOPHEN 10-325 MG PO TABS: 1.0000 | ORAL_TABLET | ORAL | 0 refills | Status: DC

## 2019-10-10 NOTE — Telephone Encounter (Signed)
Patient requesting oxyCODONE-acetaminophen (PERCOCET) 10-325 MG tablet  and would like to ensure medication will be at pharmacy on Friday 10/13/2019 ready for pick up , informed please allow 48 to 72 hour turn around   [place], [place]

## 2019-10-10 NOTE — Telephone Encounter (Signed)
Requested medication (s) are due for refill today: yes  Requested medication (s) are on the active medication list: yes  Last refill:  09/15/2019  Future visit scheduled: yes  Notes to clinic:  Refill cannot be delegated  Patient wants to make sure script will be ready for pick up by 10/13/2019   Requested Prescriptions  Pending Prescriptions Disp Refills   oxyCODONE-acetaminophen (PERCOCET) 10-325 MG tablet 150 tablet 0    Sig: Take 1 tablet by mouth every 4 (four) hours.     There is no refill protocol information for this order

## 2019-10-13 ENCOUNTER — Other Ambulatory Visit: Payer: Self-pay | Admitting: Family Medicine

## 2019-10-18 DIAGNOSIS — J42 Unspecified chronic bronchitis: Secondary | ICD-10-CM | POA: Diagnosis not present

## 2019-10-18 DIAGNOSIS — J449 Chronic obstructive pulmonary disease, unspecified: Secondary | ICD-10-CM | POA: Diagnosis not present

## 2019-10-24 IMAGING — CR DG CHEST 2V
2 series · 2 of 2 positions shown · non-contrast
Comparison: 11/08/2017

CLINICAL DATA: Acute onset shortness of breath. Cough. Coronary
artery disease.

EXAM:
CHEST  2 VIEW

[chest pa]
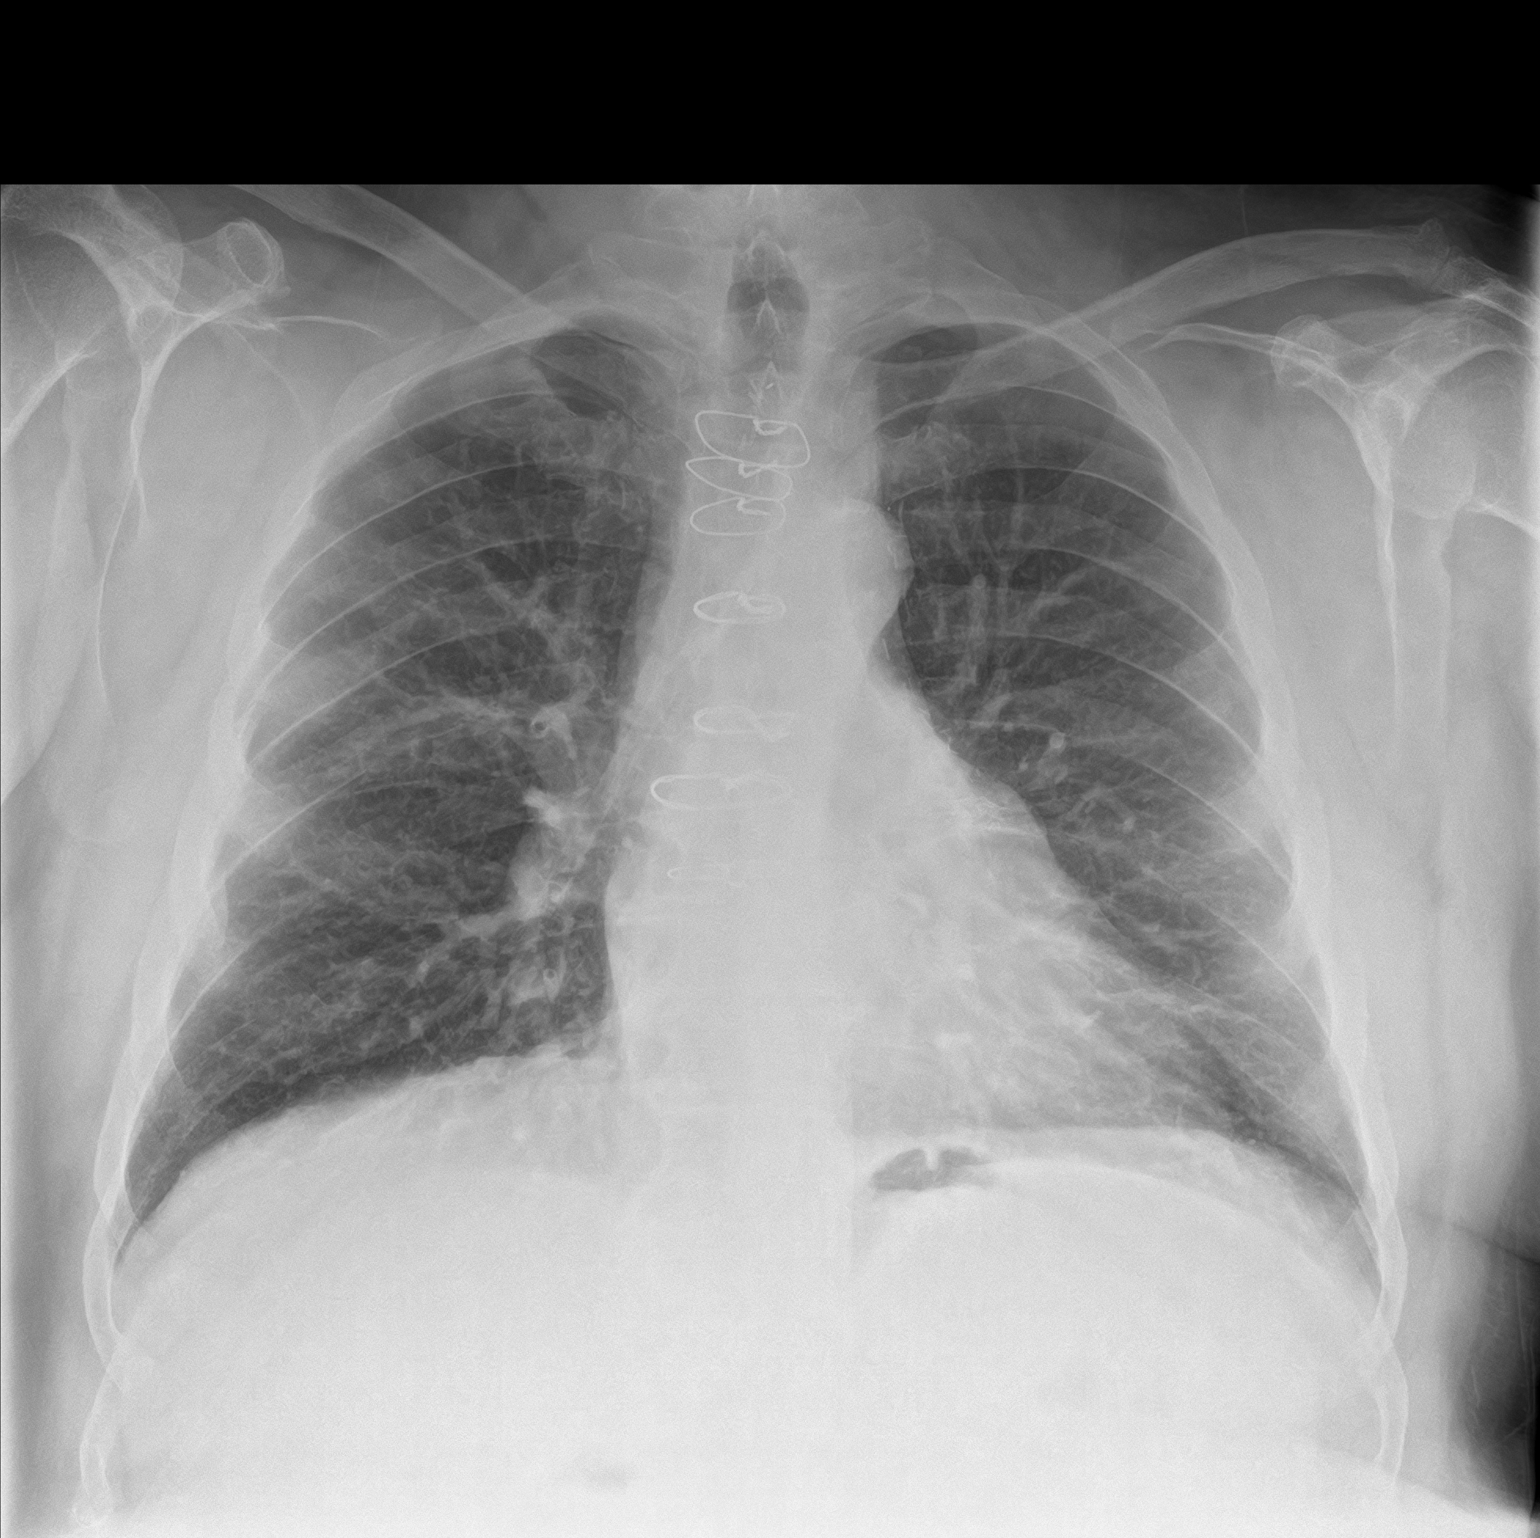

[chest lat]
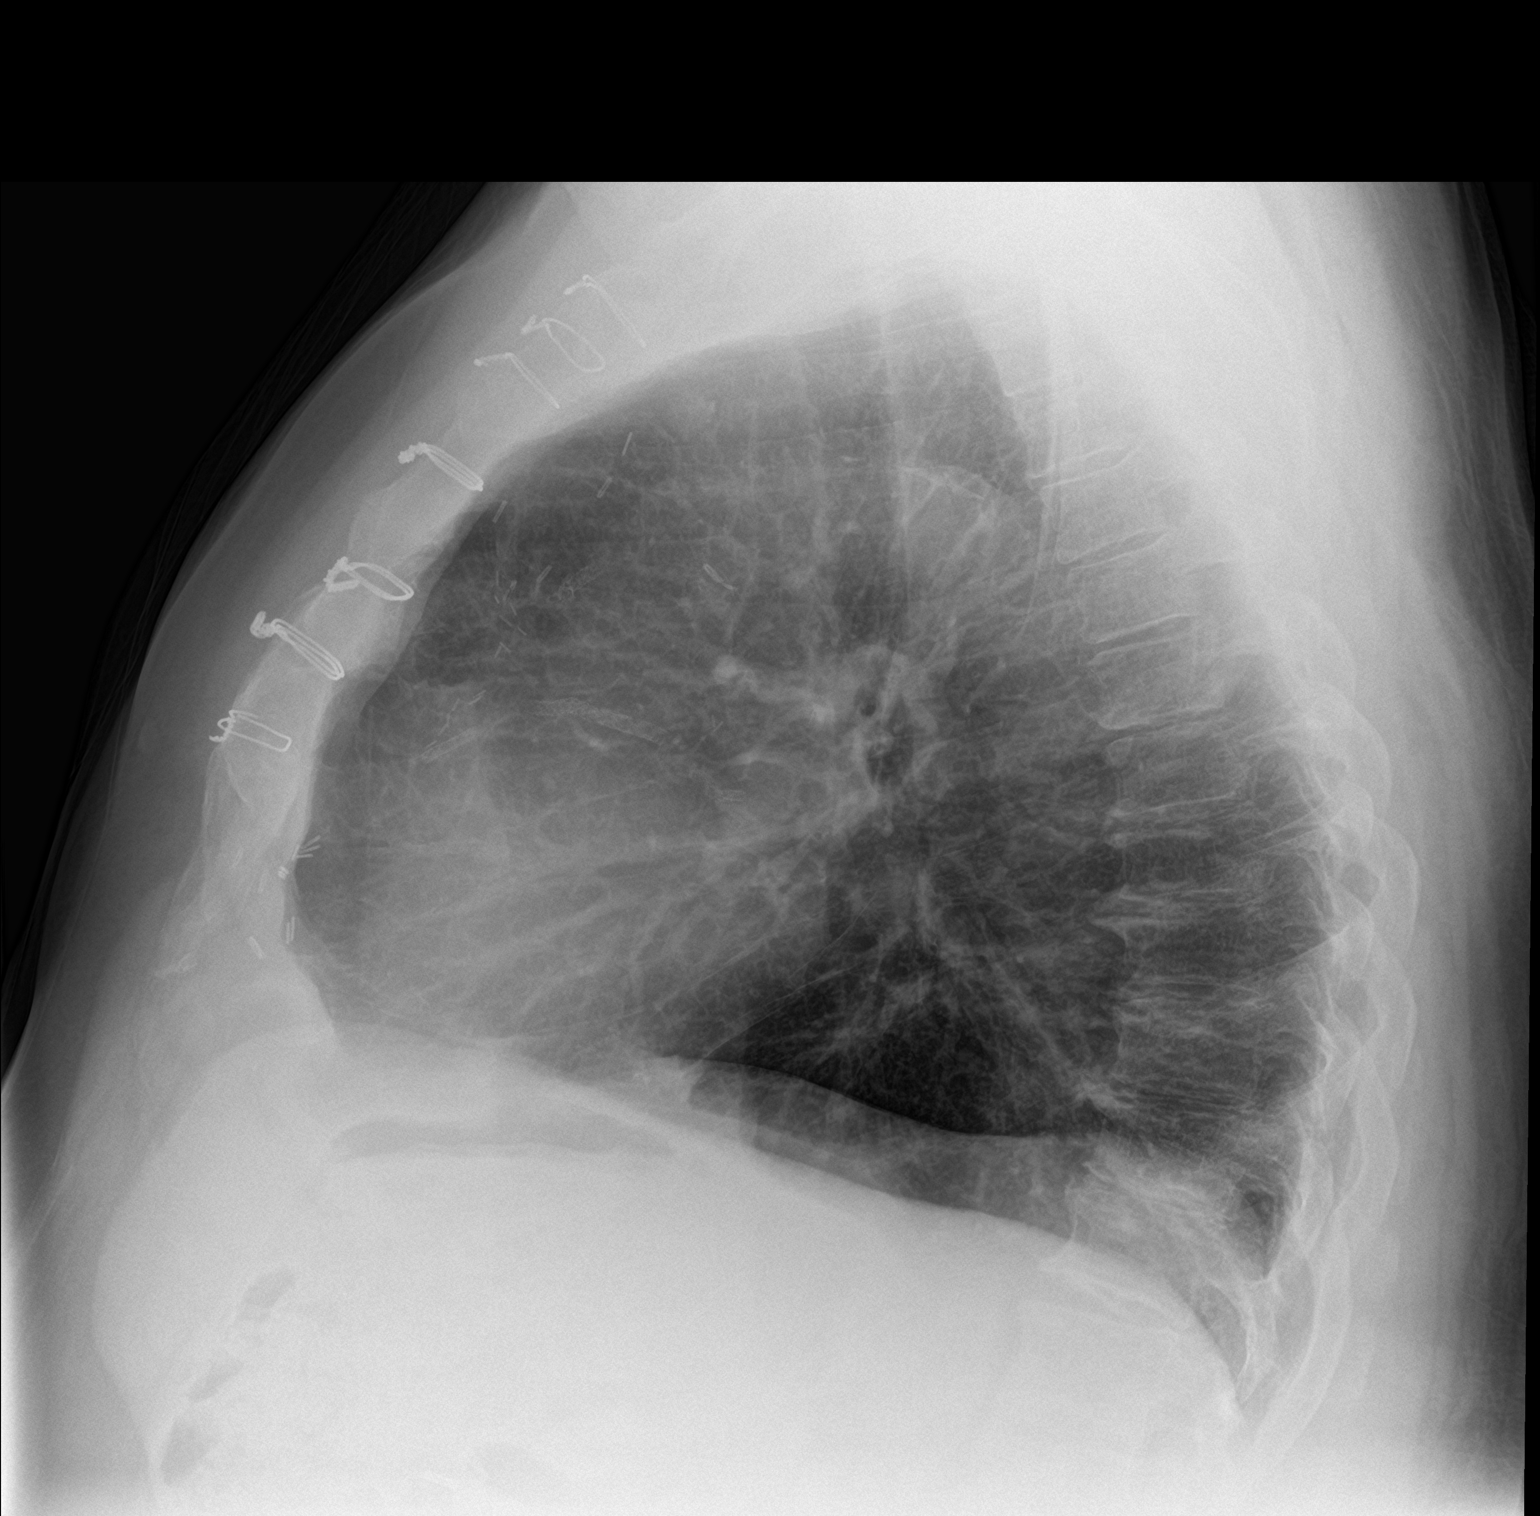

[2 of 2 positions shown; findings below may reference images not displayed]

FINDINGS: The heart size and mediastinal contours are within normal limits.
Prior CABG. Coarsening of interstitial markings is stable. No
evidence of acute infiltrate or edema. No evidence of pleural
effusion. The visualized skeletal structures are unremarkable.
IMPRESSION: No active cardiopulmonary disease.

## 2019-11-06 ENCOUNTER — Other Ambulatory Visit: Payer: Self-pay | Admitting: Family Medicine

## 2019-11-06 DIAGNOSIS — M5410 Radiculopathy, site unspecified: Secondary | ICD-10-CM

## 2019-11-06 NOTE — Telephone Encounter (Signed)
Medication Refill - Medication: oxyCODONE-acetaminophen (PERCOCET) 10-325 MG tablet  Has the patient contacted their pharmacy? No - states he has to call in (Agent: If no, request that the patient contact the pharmacy for the refill.) (Agent: If yes, when and what did the pharmacy advise?)  Preferred Pharmacy (with phone number or street name):  [place], [place] Phone:  [phone]  Fax:  [phone]     Agent: Please be advised that RX refills may take up to 3 business days. We ask that you follow-up with your pharmacy.

## 2019-11-06 NOTE — Telephone Encounter (Signed)
From [name] 

## 2019-11-07 ENCOUNTER — Other Ambulatory Visit: Payer: Self-pay | Admitting: Family Medicine

## 2019-11-07 DIAGNOSIS — M5410 Radiculopathy, site unspecified: Secondary | ICD-10-CM

## 2019-11-07 MED ORDER — OXYCODONE-ACETAMINOPHEN 10-325 MG PO TABS: 1.0000 | ORAL_TABLET | ORAL | 0 refills | Status: DC

## 2019-11-07 NOTE — Telephone Encounter (Signed)
Requested medication (s) are due for refill today: yes  Requested medication (s) are on the active medication list:yes  Last refill: 10/10/2019  Future visit scheduled:yes  Notes to clinic: This refill cannot be delegated    Requested Prescriptions  Pending Prescriptions Disp Refills   oxyCODONE-acetaminophen (PERCOCET) 10-325 MG tablet [Pharmacy Med Name: OXYCODONE-APAP 10-325 MG TAB] 150 tablet     Sig: TAKE 1 TABLET BY MOUTH EVERY 4 HOURS      Not Delegated - Analgesics:  Opioid Agonist Combinations Failed - 11/07/2019  8:41 AM      Failed - This refill cannot be delegated      Failed - Urine Drug Screen completed in last 360 days.      Passed - Valid encounter within last 6 months    Recent Outpatient Visits           1 month ago Type 2 diabetes mellitus with diabetic nephropathy, without long-term current use of insulin ([name])   [facility] Birdie Sons, MD   4 months ago Angina pectoris [name])   [name] Birdie Sons, MD   5 months ago Type 2 diabetes mellitus with diabetic nephropathy, without long-term current use of insulin [name])   [name] Birdie Sons, MD   7 months ago Insomnia, unspecified type   [name] Birdie Sons, MD   11 months ago Type 2 diabetes mellitus with diabetic nephropathy, without long-term current use of insulin [name])   [name] Birdie Sons, MD       Future Appointments             In 1 month [name], [name], MD [name], [name]

## 2019-11-14 ENCOUNTER — Other Ambulatory Visit: Payer: Self-pay | Admitting: Family Medicine

## 2019-12-06 ENCOUNTER — Other Ambulatory Visit: Payer: Self-pay | Admitting: Family Medicine

## 2019-12-06 ENCOUNTER — Telehealth: Payer: Self-pay

## 2019-12-06 DIAGNOSIS — F419 Anxiety disorder, unspecified: Secondary | ICD-10-CM

## 2019-12-06 DIAGNOSIS — M5410 Radiculopathy, site unspecified: Secondary | ICD-10-CM

## 2019-12-06 NOTE — Telephone Encounter (Signed)
Tried calling patient to let him know Dr. [name] is out of the office until Friday. Left detailed message on patient's personal cell phone (ok per DPR).  Please review patients request and advise.

## 2019-12-06 NOTE — Telephone Encounter (Signed)
Copied from [place] [phone]. Topic: Quick Communication - Rx Refill/Question >> Dec 06, 2019  9:53 AM Yvette Rack wrote: Medication: oxyCODONE-acetaminophen (PERCOCET) 10-325 MG tablet  Has the patient contacted their pharmacy? no  Preferred Pharmacy (with phone number or street name): [place], [place] Phone: [phone]  Fax: [phone]  Agent: Please be advised that RX refills may take up to 3 business days. We ask that you follow-up with your pharmacy.

## 2019-12-06 NOTE — Telephone Encounter (Signed)
Copied from [place] [phone] [id number]. Topic: General - Other >> Dec 06, 2019  9:55 AM Yvette Rack wrote: Reason for CRM: Pt stated he needs Dr. Maralyn Sago nurse to return his call because he was prescribed clonazePAM (KLONOPIN) 0.5 MG tablet but it is not working and he would like to go back to ALPRAZolam Duanne Moron) 1 MG tablet

## 2019-12-07 MED ORDER — ALPRAZOLAM 1 MG PO TABS: 1.0000 mg | ORAL_TABLET | Freq: Three times a day (TID) | ORAL | 4 refills | Status: DC

## 2019-12-07 MED ORDER — OXYCODONE-ACETAMINOPHEN 10-325 MG PO TABS: 1.0000 | ORAL_TABLET | ORAL | 0 refills | Status: DC

## 2019-12-07 NOTE — Addendum Note (Signed)
Addended by: Birdie Sons on: 12/07/2019 03:26 PM   Modules accepted: Orders

## 2019-12-20 ENCOUNTER — Other Ambulatory Visit: Payer: Self-pay | Admitting: Family Medicine

## 2019-12-20 ENCOUNTER — Other Ambulatory Visit: Payer: Self-pay | Admitting: Family

## 2019-12-20 NOTE — Telephone Encounter (Signed)
Requested medication (s) are due for refill today:   Yes for Eliquis.    Not the albuterol it's from a historical provider.  Requested medication (s) are on the active medication list:   Eliquis is.   Albuterol is not  Future visit scheduled:   Yes in 1 week.   Last ordered: Eliquis 10/13/2019  #60  1 refill             Albuterol is from a historical provider   Requested Prescriptions  Pending Prescriptions Disp Refills   ELIQUIS 5 MG TABS tablet [Pharmacy Med Name: ELIQUIS 5 MG TAB] 180 tablet 0    Sig: TAKE 1 TABLET BY MOUTH TWICE A DAY      Hematology:  Anticoagulants Failed - 12/20/2019  1:13 PM      Failed - HGB in normal range and within 360 days    Hemoglobin  Date Value Ref Range Status  10/03/2019 18.3 (H) 13.0 - 17.0 g/dL Final  05/29/2019 16.2 13.0 - 17.7 g/dL Final          Failed - HCT in normal range and within 360 days    HCT  Date Value Ref Range Status  10/03/2019 57.8 (H) 39.0 - 52.0 % Final   Hematocrit  Date Value Ref Range Status  05/29/2019 49.7 37.5 - 51.0 % Final          Passed - PLT in normal range and within 360 days    Platelets  Date Value Ref Range Status  10/03/2019 165 150 - 400 K/uL Final  05/29/2019 184 150 - 450 x10E3/uL Final          Passed - Cr in normal range and within 360 days    Creatinine  Date Value Ref Range Status  03/07/2015 0.84 mg/dL Final    Comment:    0.61-1.24 NOTE: New Reference Range  01/22/15    Creatinine, Ser  Date Value Ref Range Status  10/03/2019 1.19 0.61 - 1.24 mg/dL Final   Creatinine, POC  Date Value Ref Range Status  10/27/2016 n/a mg/dL Final          Passed - Valid encounter within last 12 months    Recent Outpatient Visits           2 months ago Type 2 diabetes mellitus with diabetic nephropathy, without long-term current use of insulin ([place])   [facility] Birdie Sons, MD   5 months ago Angina pectoris [facility])   [facility] Birdie Sons, MD   6  months ago Type 2 diabetes mellitus with diabetic nephropathy, without long-term current use of insulin ([place])   [facility] Birdie Sons, MD   9 months ago Insomnia, unspecified type   [facility] Birdie Sons, MD   1 year ago Type 2 diabetes mellitus with diabetic nephropathy, without long-term current use of insulin ([place])   [place], [name], MD       Future Appointments             In 1 week [name], [name], MD [facility], [name]              albuterol (VENTOLIN HFA) 108 (90 Base) MCG/ACT inhaler [Pharmacy Med Name: ALBUTEROL SULFATE HFA 108 (90 BASE)] 17 g 0    Sig: INHALE 2 PUFFS BY MOUTH EVERY 6 HOURS AS NEEDED FOR SHORTNESS OF BREATH      Pulmonology:  Beta Agonists Failed -  12/20/2019  1:13 PM      Failed - One inhaler should last at least one month. If the patient is requesting refills earlier, contact the patient to check for uncontrolled symptoms.      Passed - Valid encounter within last 12 months    Recent Outpatient Visits           2 months ago Type 2 diabetes mellitus with diabetic nephropathy, without long-term current use of insulin ([place])   [facility] Birdie Sons, MD   5 months ago Angina pectoris [facility])   [facility] Birdie Sons, MD   6 months ago Type 2 diabetes mellitus with diabetic nephropathy, without long-term current use of insulin [facility])   [facility] Birdie Sons, MD   9 months ago Insomnia, unspecified type   [facility] Birdie Sons, MD   1 year ago Type 2 diabetes mellitus with diabetic nephropathy, without long-term current use of insulin [facility])   [facility] Birdie Sons, MD       Future Appointments             In 1 week [name], [name], MD [facility], [name]

## 2019-12-21 ENCOUNTER — Other Ambulatory Visit: Payer: Self-pay | Admitting: Family

## 2020-01-02 ENCOUNTER — Encounter: Payer: Self-pay | Admitting: Family Medicine

## 2020-01-02 ENCOUNTER — Ambulatory Visit (INDEPENDENT_AMBULATORY_CARE_PROVIDER_SITE_OTHER): Payer: Medicare HMO | Admitting: Family Medicine

## 2020-01-02 ENCOUNTER — Other Ambulatory Visit: Payer: Self-pay

## 2020-01-02 VITALS — BP 125/75 | HR 60 | Temp 97.1°F | Wt 227.6 lb

## 2020-01-02 DIAGNOSIS — J410 Simple chronic bronchitis: Secondary | ICD-10-CM

## 2020-01-02 DIAGNOSIS — I25118 Atherosclerotic heart disease of native coronary artery with other forms of angina pectoris: Secondary | ICD-10-CM

## 2020-01-02 DIAGNOSIS — F419 Anxiety disorder, unspecified: Secondary | ICD-10-CM

## 2020-01-02 DIAGNOSIS — E1121 Type 2 diabetes mellitus with diabetic nephropathy: Secondary | ICD-10-CM | POA: Diagnosis not present

## 2020-01-02 DIAGNOSIS — M5410 Radiculopathy, site unspecified: Secondary | ICD-10-CM

## 2020-01-02 LAB — POCT GLYCOSYLATED HEMOGLOBIN (HGB A1C)
Est. average glucose Bld gHb Est-mCnc: 180
Hemoglobin A1C: 7.9 % — AB (ref 4.0–5.6)

## 2020-01-02 MED ORDER — OXYCODONE-ACETAMINOPHEN 10-325 MG PO TABS: 1.0000 | ORAL_TABLET | ORAL | 0 refills | Status: DC

## 2020-01-02 NOTE — Progress Notes (Addendum)
Patient: Lawrence [name] Male    DOB: [birth year]   64 y.o.   MRN: [id number] Visit Date: 01/02/2020  Today's Provider: [name], MD   Chief Complaint  Patient presents with  . Diabetes  . Hypertension  . Anxiety   Subjective:     HPI  Diabetes Mellitus Type II, Follow-up:   Lab Results  Component Value Date   HGBA1C 7.9 (A) 09/29/2019   HGBA1C 7.9 (A) 05/29/2019   HGBA1C 6.8 10/15/2018    Last seen for diabetes 3 months ago.  Management since then includes no changes. He reports good compliance with treatment. He is not having side effects.  Current symptoms include none  Home blood sugar records: fasting range: 120-125  Episodes of hypoglycemia? no   Current insulin regiment: Is not on insulin Most Recent Eye Exam: not UTD Weight trend: stable Prior visit with dietician: No Current exercise: walking Current diet habits: well balanced  Pertinent Labs:    Component Value Date/Time   CHOL 110 09/09/2019 0440   CHOL 151 01/10/2018 1041   CHOL 92 05/23/2014 0415   TRIG 175 (H) 09/09/2019 0440   TRIG 109 05/23/2014 0415   HDL 31 (L) 09/09/2019 0440   HDL 38 (L) 01/10/2018 1041   HDL 28 (L) 05/23/2014 0415   LDLCALC 44 09/09/2019 0440   LDLCALC 56 01/10/2018 1041   LDLCALC 42 05/23/2014 0415   CREATININE 1.19 10/03/2019 2002   CREATININE 0.84 03/07/2015 1851    Wt Readings from Last 3 Encounters:  01/02/20 227 lb 9.6 oz (103.2 kg)  10/03/19 237 lb (107.5 kg)  09/29/19 237 lb (107.5 kg)    ------------------------------------------------------------------------  Hypertension, follow-up:  BP Readings from Last 3 Encounters:  01/02/20 125/75  10/04/19 (!) 144/94  09/29/19 120/72    He was last seen for hypertension 3 months ago.  BP at that visit was 120/72. Management since that visit includes no changes. He reports good compliance with treatment. He is not having side effects.  He is exercising. He is adherent to low salt diet.    Outside blood pressures are being checked at home. He is experiencing none.  Patient denies chest pain, chest pressure/discomfort, irregular heart beat and palpitations.   Cardiovascular risk factors include advanced age (older than 68 for men, 54 for women), diabetes mellitus, hypertension and male gender.  Use of agents associated with hypertension: NSAIDS.     Weight trend: stable Wt Readings from Last 3 Encounters:  01/02/20 227 lb 9.6 oz (103.2 kg)  10/03/19 237 lb (107.5 kg)  09/29/19 237 lb (107.5 kg)    Current diet: well balanced  ------------------------------------------------------------------------  Follow up for Anxiety:  The patient was last seen for this 3 months ago. Changes made at last visit include switched from Clonazepam to Alprazolam 1 mg TID  He reports good compliance with treatment. He feels that condition is Improved.Is working much better than clonazepam.  He is not having side effects.   ------------------------------------------------------------------------------------  Allergies  Allergen Reactions  . Demerol  [Meperidine Hcl]   . Demerol [Meperidine] Hives  . Jardiance [Empagliflozin] Other (See Comments)    Perineal pain  . Prednisone Other (See Comments) and Hypertension    Pt states that this medication puts him in A-fib   . Sulfa Antibiotics Hives  . Albuterol Sulfate [Albuterol] Palpitations and Other (See Comments)    Pt currently uses this medication.    . Morphine Sulfate Nausea And Vomiting, Rash and  Other (See Comments)    Pt states that he is only allergic to the tablet form of this medication.       Current Outpatient Medications:  .  albuterol (VENTOLIN HFA) 108 (90 Base) MCG/ACT inhaler, INHALE 2 PUFFS BY MOUTH EVERY 6 HOURS AS NEEDED FOR SHORTNESS OF BREATH, Disp: 17 g, Rfl: 0 .  Albuterol Sulfate, sensor, 108 (90 Base) MCG/ACT AEPB, Inhale 2 puffs into the lungs at bedtime. AND PRN, Disp: , Rfl:  .  allopurinol  (ZYLOPRIM) 300 MG tablet, TAKE 1 TABLET BY MOUTH TWICE A DAY, Disp: 180 tablet, Rfl: 2 .  ALPRAZolam (XANAX) 1 MG tablet, Take 1 tablet (1 mg total) by mouth 3 (three) times daily., Disp: 90 tablet, Rfl: 4 .  arformoterol (BROVANA) 15 MCG/2ML NEBU, Take 15 mcg by nebulization 2 (two) times daily., Disp: , Rfl:  .  aspirin 81 MG chewable tablet, Chew 81 mg by mouth daily., Disp: , Rfl:  .  atorvastatin (LIPITOR) 80 MG tablet, TAKE ONE TABLET BY MOUTH AT BEDTIME, Disp: 90 tablet, Rfl: 1 .  Blood Glucose Monitoring Suppl (ACCU-CHEK AVIVA PLUS) w/Device KIT, CHECK BLOOD SUGAR EVERY DAY, Disp: 1 kit, Rfl: 0 .  budesonide (PULMICORT) 0.5 MG/2ML nebulizer solution, Take 0.5 mg by nebulization 2 (two) times daily., Disp: , Rfl:  .  cetirizine (ZYRTEC) 10 MG tablet, TAKE ONE TABLET BY MOUTH AT BEDTIME, Disp: 30 tablet, Rfl: 6 .  Cholecalciferol (VITAMIN D3) 125 MCG (5000 UT) CAPS, Take 5,000 Units by mouth daily., Disp: , Rfl:  .  dapagliflozin propanediol (FARXIGA) 10 MG TABS tablet, Take 10 mg by mouth daily., Disp: , Rfl:  .  docusate sodium (COLACE) 100 MG capsule, Take 100 mg by mouth daily. , Disp: , Rfl:  .  ELIQUIS 5 MG TABS tablet, TAKE 1 TABLET BY MOUTH TWICE A DAY, Disp: 180 tablet, Rfl: 0 .  fluticasone furoate-vilanterol (BREO ELLIPTA) 100-25 MCG/INH AEPB, Inhale 1 puff into the lungs daily., Disp: , Rfl:  .  glucose blood (ACCU-CHEK AVIVA PLUS) test strip, Use to check blood sugar once a day, Disp: 100 each, Rfl: 4 .  isosorbide mononitrate (IMDUR) 120 MG 24 hr tablet, Take 1 tablet (120 mg total) by mouth daily., Disp: 30 tablet, Rfl: 5 .  LINZESS 72 MCG capsule, Take 72 mcg by mouth daily before breakfast. , Disp: , Rfl:  .  lisinopril (ZESTRIL) 10 MG tablet, Take 10 mg by mouth daily., Disp: , Rfl:  .  magnesium oxide (MAG-OX) 400 (241.3 Mg) MG tablet, Take 1 tablet (400 mg total) by mouth every morning., Disp: 30 tablet, Rfl: 5 .  methocarbamol (ROBAXIN) 500 MG tablet, TAKE 1 TABLET BY  MOUTH 4 TIMES DAILY, Disp: 60 tablet, Rfl: 3 .  nitroGLYCERIN (NITROSTAT) 0.4 MG SL tablet, Place 1 tablet (0.4 mg total) under the tongue every 5 (five) minutes as needed for chest pain. Reported on 05/26/2016, Disp: 30 tablet, Rfl: 3 .  omega-3 acid ethyl esters (LOVAZA) 1 g capsule, Take 4 g by mouth daily., Disp: , Rfl:  .  omeprazole (PRILOSEC) 40 MG capsule, TAKE 1 CAPSULE BY MOUTH TWICE DAILY 30 MINUTES PRIOR TO BREAKFAST ANDDINNER., Disp: , Rfl:  .  oxyCODONE-acetaminophen (PERCOCET) 10-325 MG tablet, Take 1 tablet by mouth every 4 (four) hours., Disp: 150 tablet, Rfl: 0 .  OXYGEN, Inhale 2 L into the lungs continuous. , Disp: , Rfl:  .  ranolazine (RANEXA) 500 MG 12 hr tablet, Take 2 tablets (1,000 mg total)  by mouth 2 (two) times daily., Disp: 60 tablet, Rfl: 1 .  sotalol (BETAPACE) 80 MG tablet, Take 80 mg by mouth 2 (two) times daily., Disp: , Rfl:  .  tiotropium (SPIRIVA) 18 MCG inhalation capsule, Place 18 mcg into inhaler and inhale daily., Disp: , Rfl:  .  torsemide (DEMADEX) 100 MG tablet, Take 50 mg by mouth daily. , Disp: , Rfl:  .  traZODone (DESYREL) 100 MG tablet, Take 100 mg by mouth at bedtime., Disp: , Rfl:  .  venlafaxine XR (EFFEXOR-XR) 75 MG 24 hr capsule, Take 75 mg by mouth daily with breakfast., Disp: , Rfl:  .  citalopram (CELEXA) 20 MG tablet, Take 20 mg by mouth daily., Disp: , Rfl:   Review of Systems  Constitutional: Negative.   Respiratory: Negative.   Cardiovascular: Negative.   Gastrointestinal: Negative.   Endocrine: Negative.   Musculoskeletal: Negative.     Social History   Tobacco Use  . Smoking status: Former Smoker    Packs/day: 3.00    Years: 50.00    Pack years: 150.00    Types: Cigarettes    Quit date: 04/22/2013    Years since quitting: 6.7  . Smokeless tobacco: Never Used  Substance Use Topics  . Alcohol use: No    Comment: remotely quit alcohol use. Hx of heavy alcohol use.      Objective:   BP 125/75 (BP Location: Right Arm,  Patient Position: Sitting, Cuff Size: Normal)   Pulse 60   Temp (!) 97.1 F (36.2 C) (Temporal)   Wt 227 lb 9.6 oz (103.2 kg)   BMI 35.65 kg/m  Vitals:   01/02/20 1435  BP: 125/75  Pulse: 60  Temp: (!) 97.1 F (36.2 C)  TempSrc: Temporal  Weight: 227 lb 9.6 oz (103.2 kg)  Body mass index is 35.65 kg/m.   Physical Exam   General: Appearance:    Obese male in no acute distress  Eyes:    PERRL, conjunctiva/corneas clear, EOM's intact       Lungs:     Clear to auscultation bilaterally, respirations unlabored  Heart:    Normal heart rate. Regular rhythm. No murmurs, rubs, or gallops.   MS:   All extremities are intact.   Neurologic:   Awake, alert, oriented x 3. No apparent focal neurological           defect.        Results for orders placed or performed in visit on 01/02/20  POCT HgB A1C  Result Value Ref Range   Hemoglobin A1C 7.9 (A) 4.0 - 5.6 %   Est. average glucose Bld gHb Est-mCnc 180        Assessment & Plan    1. Type 2 diabetes mellitus with diabetic nephropathy, without long-term current use of insulin (HCC) Stable on farxiga. Off metformin due to recall. He is losing some weight and expecting to continue improve diet and be more active. Will reassess in 3 months. Consider starting back on low dose metformin of not improving.   2. Back pain with left-sided radiculopathy Doing well with current medication regiment. refill - oxyCODONE-acetaminophen (PERCOCET) 10-325 MG tablet; Take 1 tablet by mouth every 4 (four) hours.  Dispense: 150 tablet; Refill: 0  3. Anxiety Improved since changed from clonazepam back to alprazolam. Continue current medications.    4. COPD ([place]) Stable on current inhalers, Continue current medications.    5. Morbid obesity ([place]) Is working on losing weight with some weight loss since  starting [place].   6. 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 Compliant with medication.  Continue aggressive risk factor modification. Angina controlled with Isosorbide.  Continue routine follow up with Dr, [name].   Follow up 3-4 months.   The entirety of the information documented in the History of Present Illness, Review of Systems and Physical Exam were personally obtained by me. Portions of this information were initially documented by Idelle Jo, CMA and reviewed by me for thoroughness and accuracy.     [name], MD  [facility]

## 2020-01-02 NOTE — Patient Instructions (Signed)
.   Please review the attached list of medications and notify my office if there are any errors.   . Please bring all of your medications to every appointment so we can make sure that our medication list is the same as yours.   . Please contact your eyecare professional to schedule a routine eye exam

## 2020-01-29 ENCOUNTER — Other Ambulatory Visit: Payer: Self-pay | Admitting: Family Medicine

## 2020-01-29 DIAGNOSIS — M5410 Radiculopathy, site unspecified: Secondary | ICD-10-CM

## 2020-01-29 NOTE — Telephone Encounter (Signed)
Requested medication (s) are due for refill today-yes  Requested medication (s) are on the active medication list -yes  Future visit scheduled -yes  Last refill: 01/02/20  Notes to clinic: Patient is requesting non delegated Rx  Requested Prescriptions  Pending Prescriptions Disp Refills   oxyCODONE-acetaminophen (PERCOCET) 10-325 MG tablet 150 tablet 0    Sig: Take 1 tablet by mouth every 4 (four) hours.      Not Delegated - Analgesics:  Opioid Agonist Combinations Failed - 01/29/2020 10:40 AM      Failed - This refill cannot be delegated      Failed - Urine Drug Screen completed in last 360 days.      Passed - Valid encounter within last 6 months    Recent Outpatient Visits           3 weeks ago Type 2 diabetes mellitus with diabetic nephropathy, without long-term current use of insulin ([place])   [facility] Birdie Sons, MD   4 months ago Type 2 diabetes mellitus with diabetic nephropathy, without long-term current use of insulin ([place])   [facility] Birdie Sons, MD   7 months ago Angina pectoris [facility])   [facility] Birdie Sons, MD   8 months ago Type 2 diabetes mellitus with diabetic nephropathy, without long-term current use of insulin [facility])   [facility] Birdie Sons, MD   10 months ago Insomnia, unspecified type   [facility] Birdie Sons, MD       Future Appointments             In 3 months [name], [name], MD [facility], [name]                Requested Prescriptions  Pending Prescriptions Disp Refills   oxyCODONE-acetaminophen (PERCOCET) 10-325 MG tablet 150 tablet 0    Sig: Take 1 tablet by mouth every 4 (four) hours.      Not Delegated - Analgesics:  Opioid Agonist Combinations Failed - 01/29/2020 10:40 AM      Failed - This refill cannot be delegated      Failed - Urine Drug Screen completed in last 360 days.      Passed - Valid encounter  within last 6 months    Recent Outpatient Visits           3 weeks ago Type 2 diabetes mellitus with diabetic nephropathy, without long-term current use of insulin ([place])   [facility] Birdie Sons, MD   4 months ago Type 2 diabetes mellitus with diabetic nephropathy, without long-term current use of insulin [facility])   [facility] Birdie Sons, MD   7 months ago Angina pectoris [facility])   [facility] Birdie Sons, MD   8 months ago Type 2 diabetes mellitus with diabetic nephropathy, without long-term current use of insulin [facility])   [facility] Birdie Sons, MD   10 months ago Insomnia, unspecified type   [facility] Birdie Sons, MD       Future Appointments             In 3 months [name], [name], MD [facility], [name]

## 2020-01-29 NOTE — Telephone Encounter (Signed)
LOV 01/02/20 LRF 01/02/20  #150

## 2020-01-29 NOTE — Telephone Encounter (Signed)
oxyCODONE-acetaminophen (PERCOCET) 10-325 MG tablet       Patient is requesting refill.     Pharmacy:  [facility], [place] Phone:  [phone]  Fax:  [phone]

## 2020-01-29 NOTE — Telephone Encounter (Signed)
Med request previously sent to provider, for oxycodone APA 10-325.

## 2020-01-31 IMAGING — DX DG FOOT COMPLETE 3+V*R*
3 series · 3 of 3 positions shown · non-contrast
Comparison: None.

CLINICAL DATA: Stepped in hole today injuring right foot

EXAM:
RIGHT FOOT COMPLETE - 3+ VIEW

[foot ap]
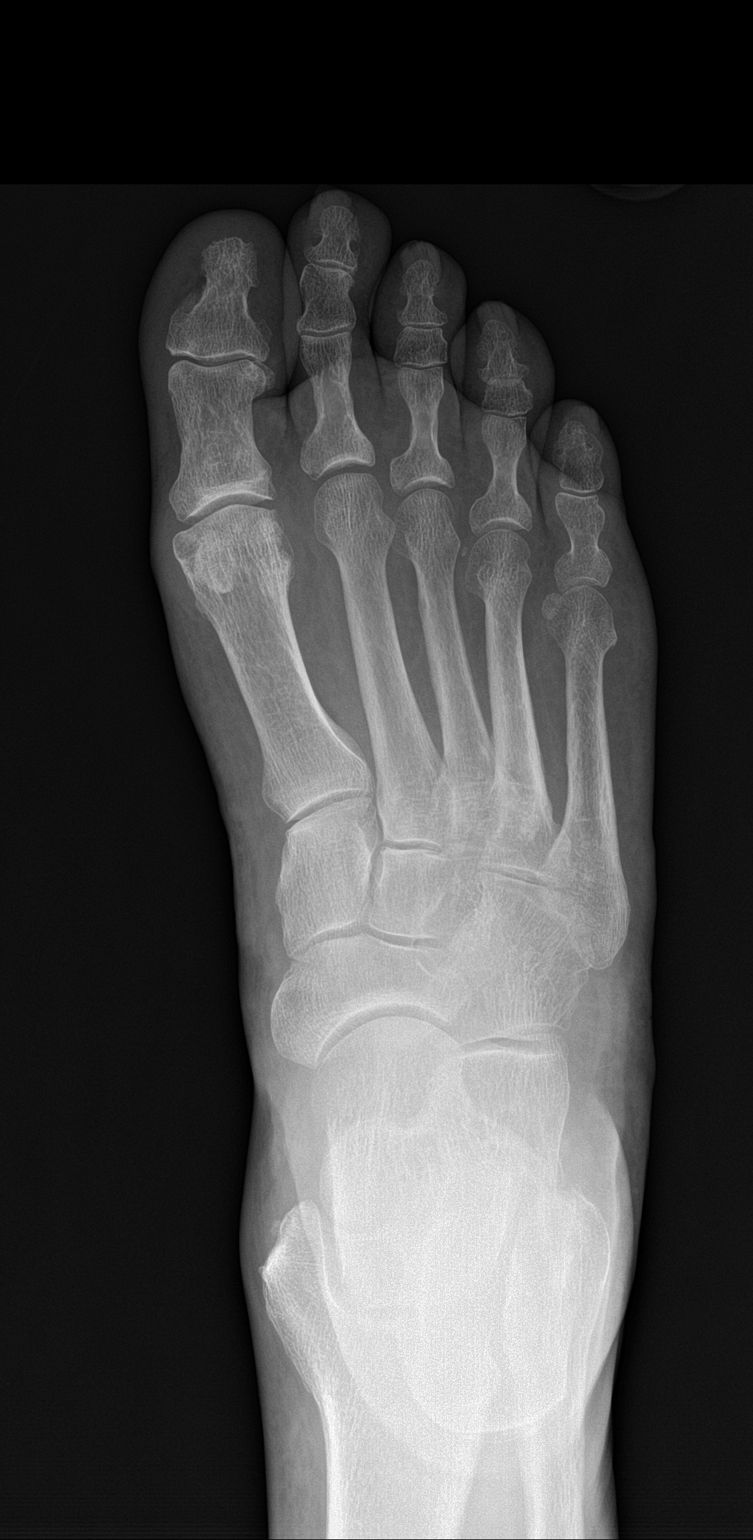

[foot obl]
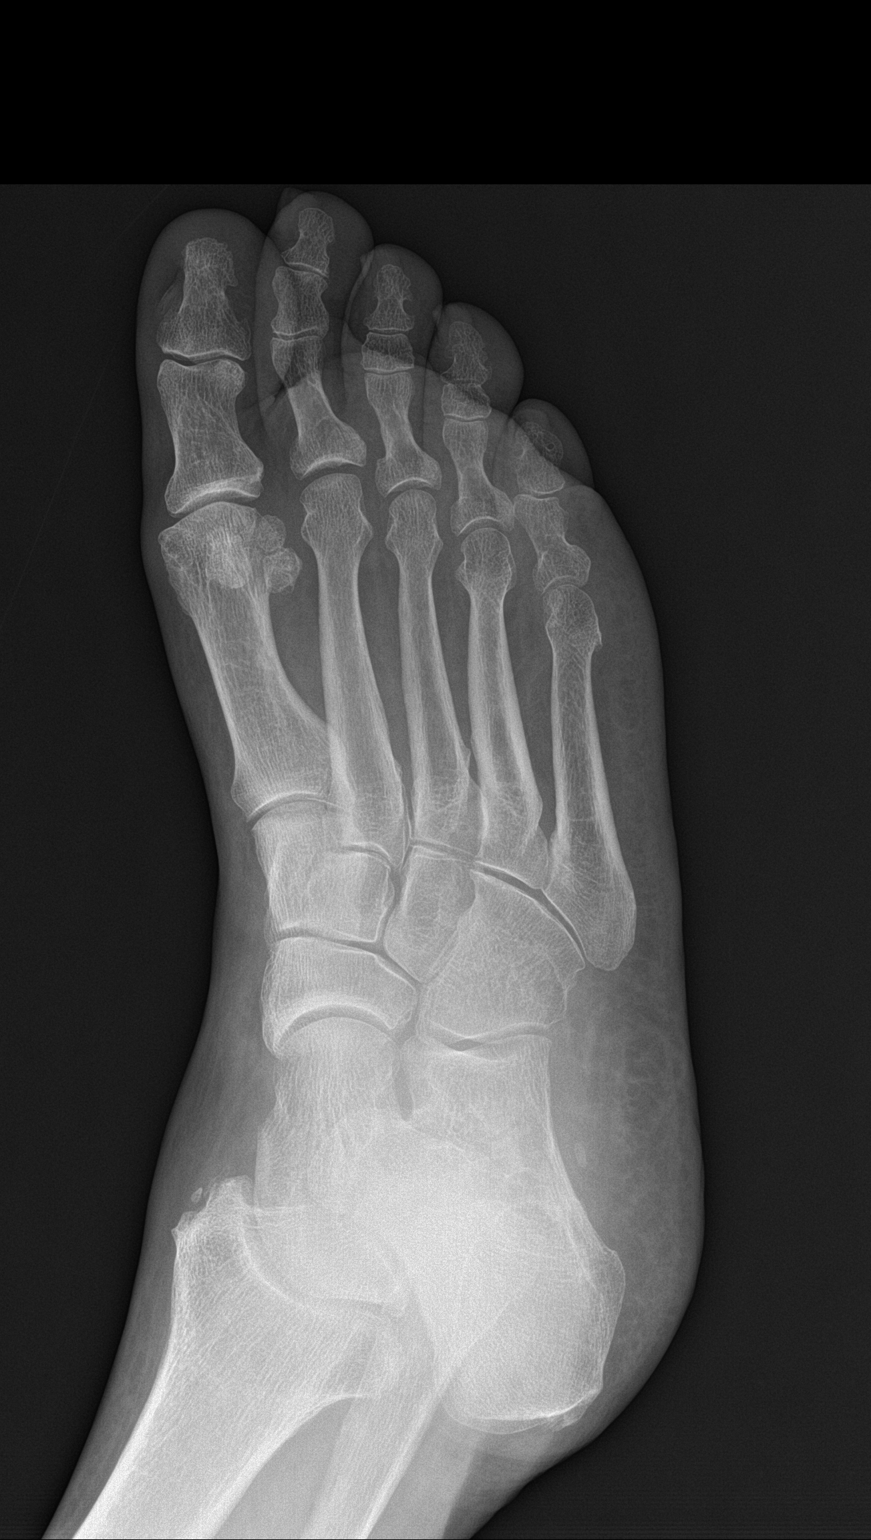

[foot lat]
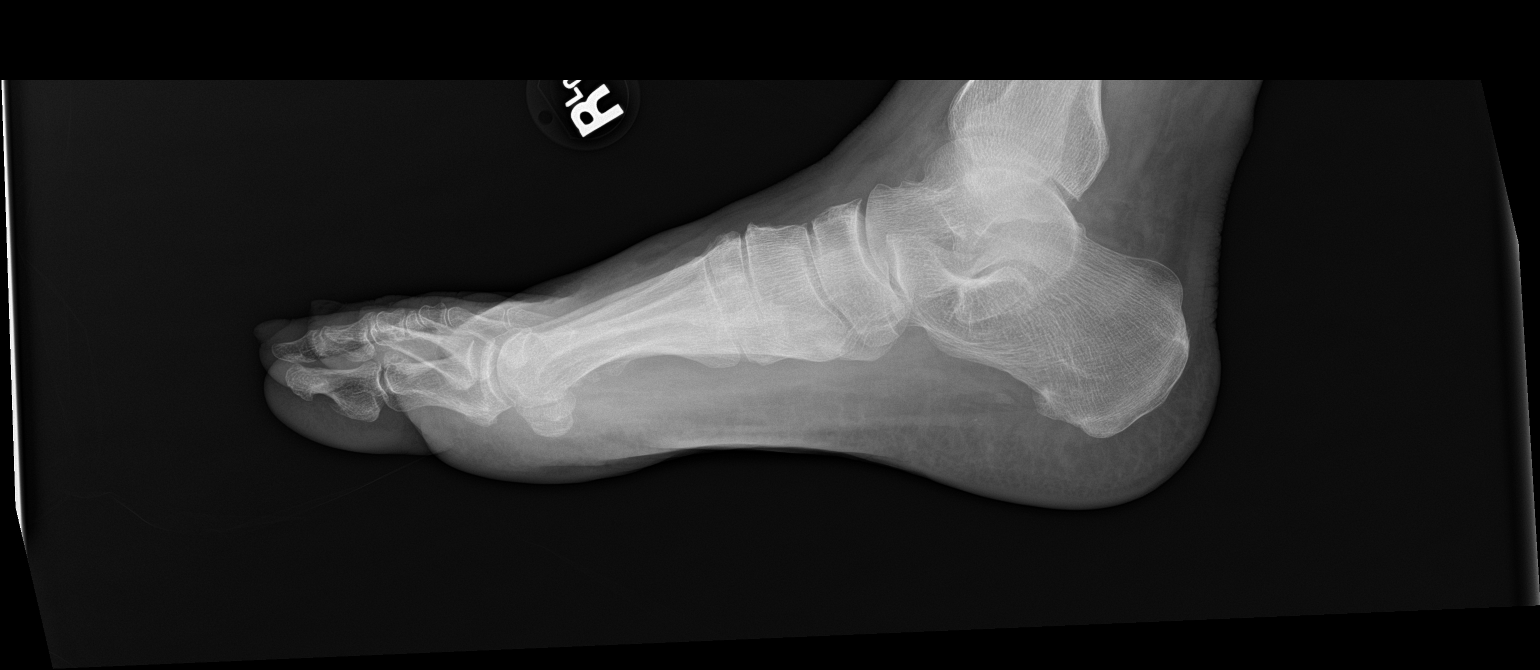

[3 of 3 positions shown; findings below may reference images not displayed]

FINDINGS: Tarsal-metatarsal alignment is normal. There is only mild
degenerative joint disease of the right first MTP joint. No acute
fracture is seen. Alignment is normal. Only a small plantar
calcaneal degenerative spur is present.
IMPRESSION: 1. No acute fracture.
2. Mild degenerative change of the right first MTP joint and small
plantar calcaneal degenerative spur.

## 2020-02-03 ENCOUNTER — Other Ambulatory Visit: Payer: Self-pay | Admitting: Family Medicine

## 2020-02-03 NOTE — Telephone Encounter (Signed)
previously prescribed by Dr. Humphrey Rolls

## 2020-02-03 NOTE — Telephone Encounter (Signed)
Requested medication (s) are due for refill today: yes  Requested medication (s) are on the active medication list: no  Last refill:  09/29/19  Future visit scheduled: yes  Notes to clinic:  historical med and provider   Requested Prescriptions  Pending Prescriptions Disp Refills   BREO ELLIPTA 100-25 MCG/INH AEPB [Pharmacy Med Name: BREO ELLIPTA 100-25 MCG/INH INH AEP] 60 each     Sig: Inhale 1 puff into the lungs daily.      Pulmonology:  Combination Products Passed - 02/03/2020 10:28 AM      Passed - Valid encounter within last 12 months    Recent Outpatient Visits           1 month ago Type 2 diabetes mellitus with diabetic nephropathy, without long-term current use of insulin ([place])   [facility] Birdie Sons, MD   4 months ago Type 2 diabetes mellitus with diabetic nephropathy, without long-term current use of insulin [facility])   [facility] Birdie Sons, MD   7 months ago Angina pectoris [facility])   [facility] Birdie Sons, MD   8 months ago Type 2 diabetes mellitus with diabetic nephropathy, without long-term current use of insulin [facility])   [facility] Birdie Sons, MD   10 months ago Insomnia, unspecified type   [facility] Birdie Sons, MD       Future Appointments             In 2 months [name], [name], MD [facility] [place]

## 2020-02-26 ENCOUNTER — Other Ambulatory Visit: Payer: Self-pay | Admitting: Family Medicine

## 2020-02-26 DIAGNOSIS — M5410 Radiculopathy, site unspecified: Secondary | ICD-10-CM

## 2020-02-26 MED ORDER — OXYCODONE-ACETAMINOPHEN 10-325 MG PO TABS: ORAL_TABLET | ORAL | 0 refills | Status: DC

## 2020-02-26 NOTE — Telephone Encounter (Signed)
Requested medication (s) are due for refill today: yes  Requested medication (s) are on the active medication list:yes  Last refill:  01/29/20  Future visit scheduled: yes  Notes to clinic:  Medication not delegated    Requested Prescriptions  Pending Prescriptions Disp Refills   oxyCODONE-acetaminophen (PERCOCET) 10-325 MG tablet 150 tablet 0      Not Delegated - Analgesics:  Opioid Agonist Combinations Failed - 02/26/2020  8:56 AM      Failed - This refill cannot be delegated      Failed - Urine Drug Screen completed in last 360 days.      Passed - Valid encounter within last 6 months    Recent Outpatient Visits           1 month ago Type 2 diabetes mellitus with diabetic nephropathy, without long-term current use of insulin ([place])   [facility] Birdie Sons, MD   5 months ago Type 2 diabetes mellitus with diabetic nephropathy, without long-term current use of insulin [facility])   [facility] Birdie Sons, MD   8 months ago Angina pectoris [facility])   [facility] Birdie Sons, MD   9 months ago Type 2 diabetes mellitus with diabetic nephropathy, without long-term current use of insulin [facility])   [facility] Birdie Sons, MD   11 months ago Insomnia, unspecified type   [facility] Birdie Sons, MD       Future Appointments             In 2 months [name], [name], MD [facility], [name]

## 2020-02-26 NOTE — Telephone Encounter (Signed)
Medication Refill - Medication:  oxyCODONE-acetaminophen (PERCOCET) 10-325 MG tablet    Has the patient contacted their pharmacy? Yes advised to call office.  Preferred Pharmacy (with phone number or street name):  [facility], [place] Phone:  [phone]  Fax:  [phone]      Agent: Please be advised that RX refills may take up to 3 business days. We ask that you follow-up with your pharmacy.

## 2020-03-07 ENCOUNTER — Inpatient Hospital Stay
Admission: EM | Admit: 2020-03-07 | Discharge: 2020-03-11 | DRG: 287 | Disposition: A | Discharge status: Home / Self Care | Payer: Medicare HMO | Attending: Internal Medicine | Admitting: Internal Medicine

## 2020-03-07 ENCOUNTER — Emergency Department: Payer: Medicare HMO

## 2020-03-07 ENCOUNTER — Encounter: Payer: Self-pay | Admitting: Emergency Medicine

## 2020-03-07 ENCOUNTER — Other Ambulatory Visit: Payer: Self-pay

## 2020-03-07 DIAGNOSIS — I5023 Acute on chronic systolic (congestive) heart failure: Secondary | ICD-10-CM | POA: Diagnosis present

## 2020-03-07 DIAGNOSIS — Z7951 Long term (current) use of inhaled steroids: Secondary | ICD-10-CM | POA: Diagnosis not present

## 2020-03-07 DIAGNOSIS — Z833 Family history of diabetes mellitus: Secondary | ICD-10-CM

## 2020-03-07 DIAGNOSIS — Z79891 Long term (current) use of opiate analgesic: Secondary | ICD-10-CM | POA: Diagnosis not present

## 2020-03-07 DIAGNOSIS — I252 Old myocardial infarction: Secondary | ICD-10-CM

## 2020-03-07 DIAGNOSIS — F419 Anxiety disorder, unspecified: Secondary | ICD-10-CM | POA: Diagnosis present

## 2020-03-07 DIAGNOSIS — E1121 Type 2 diabetes mellitus with diabetic nephropathy: Secondary | ICD-10-CM | POA: Diagnosis present

## 2020-03-07 DIAGNOSIS — I214 Non-ST elevation (NSTEMI) myocardial infarction: Secondary | ICD-10-CM

## 2020-03-07 DIAGNOSIS — I25110 Atherosclerotic heart disease of native coronary artery with unstable angina pectoris: Principal | ICD-10-CM | POA: Diagnosis present

## 2020-03-07 DIAGNOSIS — Z20822 Contact with and (suspected) exposure to covid-19: Secondary | ICD-10-CM | POA: Diagnosis present

## 2020-03-07 DIAGNOSIS — I480 Paroxysmal atrial fibrillation: Secondary | ICD-10-CM | POA: Diagnosis present

## 2020-03-07 DIAGNOSIS — Z9981 Dependence on supplemental oxygen: Secondary | ICD-10-CM | POA: Diagnosis not present

## 2020-03-07 DIAGNOSIS — I4891 Unspecified atrial fibrillation: Secondary | ICD-10-CM | POA: Diagnosis present

## 2020-03-07 DIAGNOSIS — I110 Hypertensive heart disease with heart failure: Secondary | ICD-10-CM | POA: Diagnosis present

## 2020-03-07 DIAGNOSIS — I5022 Chronic systolic (congestive) heart failure: Secondary | ICD-10-CM | POA: Diagnosis present

## 2020-03-07 DIAGNOSIS — R079 Chest pain, unspecified: Secondary | ICD-10-CM | POA: Diagnosis not present

## 2020-03-07 DIAGNOSIS — J449 Chronic obstructive pulmonary disease, unspecified: Secondary | ICD-10-CM | POA: Diagnosis present

## 2020-03-07 DIAGNOSIS — Z951 Presence of aortocoronary bypass graft: Secondary | ICD-10-CM | POA: Diagnosis not present

## 2020-03-07 DIAGNOSIS — F418 Other specified anxiety disorders: Secondary | ICD-10-CM | POA: Diagnosis present

## 2020-03-07 DIAGNOSIS — I5032 Chronic diastolic (congestive) heart failure: Secondary | ICD-10-CM

## 2020-03-07 DIAGNOSIS — K219 Gastro-esophageal reflux disease without esophagitis: Secondary | ICD-10-CM | POA: Diagnosis present

## 2020-03-07 DIAGNOSIS — E875 Hyperkalemia: Secondary | ICD-10-CM | POA: Diagnosis not present

## 2020-03-07 DIAGNOSIS — Z79899 Other long term (current) drug therapy: Secondary | ICD-10-CM | POA: Diagnosis not present

## 2020-03-07 DIAGNOSIS — J9611 Chronic respiratory failure with hypoxia: Secondary | ICD-10-CM | POA: Diagnosis present

## 2020-03-07 DIAGNOSIS — I10 Essential (primary) hypertension: Secondary | ICD-10-CM | POA: Diagnosis not present

## 2020-03-07 DIAGNOSIS — E1165 Type 2 diabetes mellitus with hyperglycemia: Secondary | ICD-10-CM | POA: Diagnosis present

## 2020-03-07 DIAGNOSIS — E785 Hyperlipidemia, unspecified: Secondary | ICD-10-CM | POA: Diagnosis present

## 2020-03-07 DIAGNOSIS — I25790 Atherosclerosis of other coronary artery bypass graft(s) with unstable angina pectoris: Secondary | ICD-10-CM | POA: Diagnosis not present

## 2020-03-07 DIAGNOSIS — Z87891 Personal history of nicotine dependence: Secondary | ICD-10-CM

## 2020-03-07 DIAGNOSIS — G4733 Obstructive sleep apnea (adult) (pediatric): Secondary | ICD-10-CM | POA: Diagnosis present

## 2020-03-07 DIAGNOSIS — Z7982 Long term (current) use of aspirin: Secondary | ICD-10-CM | POA: Diagnosis not present

## 2020-03-07 DIAGNOSIS — E782 Mixed hyperlipidemia: Secondary | ICD-10-CM | POA: Diagnosis present

## 2020-03-07 DIAGNOSIS — Z955 Presence of coronary angioplasty implant and graft: Secondary | ICD-10-CM

## 2020-03-07 DIAGNOSIS — Z820 Family history of epilepsy and other diseases of the nervous system: Secondary | ICD-10-CM

## 2020-03-07 DIAGNOSIS — Z8249 Family history of ischemic heart disease and other diseases of the circulatory system: Secondary | ICD-10-CM

## 2020-03-07 DIAGNOSIS — Z825 Family history of asthma and other chronic lower respiratory diseases: Secondary | ICD-10-CM

## 2020-03-07 DIAGNOSIS — R7989 Other specified abnormal findings of blood chemistry: Secondary | ICD-10-CM | POA: Diagnosis present

## 2020-03-07 DIAGNOSIS — R778 Other specified abnormalities of plasma proteins: Secondary | ICD-10-CM | POA: Diagnosis present

## 2020-03-07 DIAGNOSIS — R0789 Other chest pain: Secondary | ICD-10-CM | POA: Diagnosis not present

## 2020-03-07 DIAGNOSIS — Z7984 Long term (current) use of oral hypoglycemic drugs: Secondary | ICD-10-CM

## 2020-03-07 LAB — APTT
aPTT: 32 seconds (ref 24–36)
aPTT: 102 seconds — ABNORMAL HIGH (ref 24–36)

## 2020-03-07 LAB — BASIC METABOLIC PANEL
Anion gap: 9 (ref 5–15)
BUN: 12 mg/dL (ref 8–23)
CO2: 30 mmol/L (ref 22–32)
Calcium: 9.2 mg/dL (ref 8.9–10.3)
Chloride: 98 mmol/L (ref 98–111)
Creatinine, Ser: 1.01 mg/dL (ref 0.61–1.24)
GFR calc Af Amer: >60 mL/min (ref >60)
GFR calc non Af Amer: >60 mL/min (ref >60)
Glucose, Bld: 307 mg/dL — ABNORMAL HIGH (ref 70–99)
Potassium: 4.3 mmol/L (ref 3.5–5.1)
Sodium: 137 mmol/L (ref 135–145)

## 2020-03-07 LAB — BRAIN NATRIURETIC PEPTIDE: B Natriuretic Peptide: 171 pg/mL — ABNORMAL HIGH (ref 0.0–100.0)

## 2020-03-07 LAB — PROTIME-INR
INR: 1 (ref 0.8–1.2)
Prothrombin Time: 13.1 seconds (ref 11.4–15.2)

## 2020-03-07 LAB — CBC
HCT: 55.4 % — ABNORMAL HIGH (ref 39.0–52.0)
Hemoglobin: 17.7 g/dL — ABNORMAL HIGH (ref 13.0–17.0)
MCH: 30 pg (ref 26.0–34.0)
MCHC: 31.9 g/dL (ref 30.0–36.0)
MCV: 93.9 fL (ref 80.0–100.0)
Platelets: 150 10*3/uL (ref 150–400)
RBC: 5.9 MIL/uL — ABNORMAL HIGH (ref 4.22–5.81)
RDW: 14.7 % (ref 11.5–15.5)
WBC: 6.2 10*3/uL (ref 4.0–10.5)
nRBC: 0 % (ref 0.0–0.2)

## 2020-03-07 LAB — HEMOGLOBIN A1C
Hgb A1c MFr Bld: 7.9 % — ABNORMAL HIGH (ref 4.8–5.6)
Mean Plasma Glucose: 180.03 mg/dL

## 2020-03-07 LAB — RESPIRATORY PANEL BY RT PCR (FLU A&B, COVID)
Influenza A by PCR: NEGATIVE
Influenza B by PCR: NEGATIVE
SARS Coronavirus 2 by RT PCR: NEGATIVE

## 2020-03-07 LAB — TROPONIN I (HIGH SENSITIVITY)
Troponin I (High Sensitivity): 152 ng/L (ref <18)
Troponin I (High Sensitivity): 149 ng/L (ref <18)
Troponin I (High Sensitivity): 145 ng/L (ref <18)
Troponin I (High Sensitivity): 133 ng/L (ref <18)
Troponin I (High Sensitivity): 117 ng/L (ref <18)

## 2020-03-07 LAB — MAGNESIUM: Magnesium: 2.2 mg/dL (ref 1.7–2.4)

## 2020-03-07 LAB — GLUCOSE, CAPILLARY
Glucose-Capillary: 198 mg/dL — ABNORMAL HIGH (ref 70–99)
Glucose-Capillary: 175 mg/dL — ABNORMAL HIGH (ref 70–99)

## 2020-03-07 LAB — HEPARIN LEVEL (UNFRACTIONATED): Heparin Unfractionated: 1.08 IU/mL — ABNORMAL HIGH (ref 0.30–0.70)

## 2020-03-07 MED ORDER — HEPARIN (PORCINE) 25000 UT/250ML-% IV SOLN
1400.0000 [IU]/h | INTRAVENOUS | Status: DC
  Administered 2020-03-07: 1200 [IU]/h via INTRAVENOUS
  Administered 2020-03-08 – 2020-03-09 (×2): 1000 [IU]/h via INTRAVENOUS
  Administered 2020-03-10: 1200 [IU]/h via INTRAVENOUS
  Administered 2020-03-11: 1400 [IU]/h via INTRAVENOUS
  Filled 2020-03-07 (×5): qty 250

## 2020-03-07 MED ORDER — FLUTICASONE FUROATE-VILANTEROL 100-25 MCG/INH IN AEPB
1.0000 | INHALATION_SPRAY | Freq: Every day | RESPIRATORY_TRACT | Status: DC
  Administered 2020-03-08 – 2020-03-10 (×3): 1 via RESPIRATORY_TRACT
  Filled 2020-03-07: qty 28

## 2020-03-07 MED ORDER — RANOLAZINE ER 500 MG PO TB12: 1000.0000 mg | ORAL_TABLET | Freq: Two times a day (BID) | ORAL | Status: DC

## 2020-03-07 MED ORDER — HYDRALAZINE HCL 20 MG/ML IJ SOLN: 5.0000 mg | INTRAMUSCULAR | Status: DC | PRN

## 2020-03-07 MED ORDER — MORPHINE SULFATE (PF) 2 MG/ML IV SOLN
INTRAVENOUS | Status: AC
  Filled 2020-03-07: qty 1

## 2020-03-07 MED ORDER — NITROGLYCERIN 0.4 MG SL SUBL: 0.4000 mg | SUBLINGUAL_TABLET | SUBLINGUAL | Status: DC | PRN

## 2020-03-07 MED ORDER — HYDROMORPHONE HCL 1 MG/ML IJ SOLN
1.0000 mg | Freq: Once | INTRAMUSCULAR | Status: AC
  Administered 2020-03-07: 1 mg via INTRAVENOUS
  Filled 2020-03-07: qty 1

## 2020-03-07 MED ORDER — ALBUTEROL SULFATE (2.5 MG/3ML) 0.083% IN NEBU: 2.5000 mg | INHALATION_SOLUTION | Freq: Two times a day (BID) | RESPIRATORY_TRACT | Status: DC | PRN

## 2020-03-07 MED ORDER — ONDANSETRON HCL 4 MG/2ML IJ SOLN
4.0000 mg | Freq: Four times a day (QID) | INTRAMUSCULAR | Status: DC | PRN
  Administered 2020-03-07 – 2020-03-09 (×2): 4 mg via INTRAVENOUS
  Filled 2020-03-07 (×2): qty 2

## 2020-03-07 MED ORDER — ASPIRIN 81 MG PO CHEW
81.0000 mg | CHEWABLE_TABLET | Freq: Every day | ORAL | Status: DC
  Administered 2020-03-08 – 2020-03-11 (×4): 81 mg via ORAL
  Filled 2020-03-07 (×4): qty 1

## 2020-03-07 MED ORDER — MAGNESIUM OXIDE 400 (241.3 MG) MG PO TABS
400.0000 mg | ORAL_TABLET | ORAL | Status: DC
  Administered 2020-03-08 – 2020-03-11 (×4): 400 mg via ORAL
  Filled 2020-03-07 (×4): qty 1

## 2020-03-07 MED ORDER — MORPHINE SULFATE (PF) 2 MG/ML IV SOLN
2.0000 mg | INTRAVENOUS | Status: DC | PRN
  Administered 2020-03-07: 2 mg via INTRAVENOUS
  Filled 2020-03-07: qty 1

## 2020-03-07 MED ORDER — TRAZODONE HCL 100 MG PO TABS
100.0000 mg | ORAL_TABLET | Freq: Every day | ORAL | Status: DC
  Administered 2020-03-07 – 2020-03-10 (×4): 100 mg via ORAL
  Filled 2020-03-07 (×4): qty 1

## 2020-03-07 MED ORDER — LINACLOTIDE 72 MCG PO CAPS
72.0000 ug | ORAL_CAPSULE | Freq: Every day | ORAL | Status: DC
  Administered 2020-03-08 – 2020-03-10 (×3): 72 ug via ORAL
  Filled 2020-03-07 (×5): qty 1

## 2020-03-07 MED ORDER — MORPHINE SULFATE (PF) 2 MG/ML IV SOLN
2.0000 mg | INTRAVENOUS | Status: DC | PRN
  Administered 2020-03-07: 2 mg via INTRAVENOUS

## 2020-03-07 MED ORDER — INSULIN ASPART 100 UNIT/ML ~~LOC~~ SOLN
0.0000 [IU] | Freq: Three times a day (TID) | SUBCUTANEOUS | Status: DC
  Administered 2020-03-07 – 2020-03-08 (×2): 2 [IU] via SUBCUTANEOUS
  Administered 2020-03-08 (×2): 3 [IU] via SUBCUTANEOUS
  Administered 2020-03-09: 1 [IU] via SUBCUTANEOUS
  Administered 2020-03-09 – 2020-03-10 (×4): 2 [IU] via SUBCUTANEOUS
  Administered 2020-03-10: 1 [IU] via SUBCUTANEOUS
  Administered 2020-03-11: 2 [IU] via SUBCUTANEOUS
  Filled 2020-03-07 (×11): qty 1

## 2020-03-07 MED ORDER — KETOROLAC TROMETHAMINE 15 MG/ML IJ SOLN
15.0000 mg | Freq: Four times a day (QID) | INTRAMUSCULAR | Status: DC | PRN
  Administered 2020-03-07 – 2020-03-10 (×5): 15 mg via INTRAVENOUS
  Filled 2020-03-07 (×6): qty 1

## 2020-03-07 MED ORDER — ISOSORBIDE MONONITRATE ER 60 MG PO TB24: 120.0000 mg | ORAL_TABLET | Freq: Every day | ORAL | Status: DC

## 2020-03-07 MED ORDER — BUDESONIDE 0.5 MG/2ML IN SUSP
0.5000 mg | Freq: Two times a day (BID) | RESPIRATORY_TRACT | Status: DC
  Administered 2020-03-07 – 2020-03-10 (×6): 0.5 mg via RESPIRATORY_TRACT
  Filled 2020-03-07 (×6): qty 2

## 2020-03-07 MED ORDER — ATORVASTATIN CALCIUM 80 MG PO TABS
80.0000 mg | ORAL_TABLET | Freq: Every day | ORAL | Status: DC
  Administered 2020-03-07 – 2020-03-10 (×4): 80 mg via ORAL
  Filled 2020-03-07 (×4): qty 1

## 2020-03-07 MED ORDER — VITAMIN D 25 MCG (1000 UNIT) PO TABS
5000.0000 [IU] | ORAL_TABLET | Freq: Every day | ORAL | Status: DC
  Administered 2020-03-08 – 2020-03-11 (×4): 5000 [IU] via ORAL
  Filled 2020-03-07 (×3): qty 5

## 2020-03-07 MED ORDER — METHOCARBAMOL 500 MG PO TABS
500.0000 mg | ORAL_TABLET | Freq: Four times a day (QID) | ORAL | Status: DC
  Administered 2020-03-07 – 2020-03-11 (×14): 500 mg via ORAL
  Filled 2020-03-07 (×17): qty 1

## 2020-03-07 MED ORDER — ALPRAZOLAM 0.5 MG PO TABS
1.0000 mg | ORAL_TABLET | Freq: Three times a day (TID) | ORAL | Status: DC
  Administered 2020-03-07 – 2020-03-11 (×11): 1 mg via ORAL
  Filled 2020-03-07 (×11): qty 2

## 2020-03-07 MED ORDER — DOCUSATE SODIUM 100 MG PO CAPS
100.0000 mg | ORAL_CAPSULE | Freq: Every day | ORAL | Status: DC
  Administered 2020-03-08 – 2020-03-11 (×4): 100 mg via ORAL
  Filled 2020-03-07 (×4): qty 1

## 2020-03-07 MED ORDER — SOTALOL HCL 80 MG PO TABS
80.0000 mg | ORAL_TABLET | Freq: Two times a day (BID) | ORAL | Status: DC
  Administered 2020-03-08 – 2020-03-10 (×2): 80 mg via ORAL
  Filled 2020-03-07 (×9): qty 1

## 2020-03-07 MED ORDER — LISINOPRIL 10 MG PO TABS
10.0000 mg | ORAL_TABLET | Freq: Every day | ORAL | Status: DC
  Administered 2020-03-08 – 2020-03-11 (×4): 10 mg via ORAL
  Filled 2020-03-07 (×4): qty 1

## 2020-03-07 MED ORDER — ALLOPURINOL 300 MG PO TABS
300.0000 mg | ORAL_TABLET | Freq: Two times a day (BID) | ORAL | Status: DC
  Administered 2020-03-07 – 2020-03-11 (×8): 300 mg via ORAL
  Filled 2020-03-07 (×9): qty 1

## 2020-03-07 MED ORDER — ACETAMINOPHEN 325 MG PO TABS: 650.0000 mg | ORAL_TABLET | ORAL | Status: DC | PRN

## 2020-03-07 MED ORDER — OMEGA-3-ACID ETHYL ESTERS 1 G PO CAPS
4.0000 g | ORAL_CAPSULE | Freq: Every day | ORAL | Status: DC
  Administered 2020-03-08 – 2020-03-11 (×4): 4 g via ORAL
  Filled 2020-03-07 (×4): qty 4

## 2020-03-07 MED ORDER — RANOLAZINE ER 500 MG PO TB12
1000.0000 mg | ORAL_TABLET | Freq: Two times a day (BID) | ORAL | Status: DC
  Administered 2020-03-07 – 2020-03-11 (×8): 1000 mg via ORAL
  Filled 2020-03-07 (×9): qty 2

## 2020-03-07 MED ORDER — SOTALOL HCL 80 MG PO TABS: 80.0000 mg | ORAL_TABLET | Freq: Two times a day (BID) | ORAL | Status: DC

## 2020-03-07 MED ORDER — HEPARIN BOLUS VIA INFUSION
4000.0000 [IU] | Freq: Once | INTRAVENOUS | Status: AC
  Administered 2020-03-07: 4000 [IU] via INTRAVENOUS
  Filled 2020-03-07: qty 4000

## 2020-03-07 MED ORDER — MORPHINE SULFATE (PF) 4 MG/ML IV SOLN
4.0000 mg | Freq: Once | INTRAVENOUS | Status: AC
  Administered 2020-03-07: 4 mg via INTRAVENOUS
  Filled 2020-03-07: qty 1

## 2020-03-07 MED ORDER — LORATADINE 10 MG PO TABS
10.0000 mg | ORAL_TABLET | Freq: Every day | ORAL | Status: DC
  Administered 2020-03-08 – 2020-03-11 (×4): 10 mg via ORAL
  Filled 2020-03-07 (×4): qty 1

## 2020-03-07 MED ORDER — OXYCODONE-ACETAMINOPHEN 5-325 MG PO TABS
1.0000 | ORAL_TABLET | Freq: Four times a day (QID) | ORAL | Status: DC | PRN
  Administered 2020-03-08: 1 via ORAL
  Filled 2020-03-07: qty 1

## 2020-03-07 MED ORDER — HYDROMORPHONE HCL 1 MG/ML IJ SOLN
0.5000 mg | INTRAMUSCULAR | Status: DC | PRN
  Administered 2020-03-07 – 2020-03-11 (×14): 0.5 mg via INTRAVENOUS
  Filled 2020-03-07 (×15): qty 1

## 2020-03-07 MED ORDER — TIOTROPIUM BROMIDE MONOHYDRATE 18 MCG IN CAPS
18.0000 ug | ORAL_CAPSULE | Freq: Every day | RESPIRATORY_TRACT | Status: DC
  Administered 2020-03-08 – 2020-03-10 (×3): 18 ug via RESPIRATORY_TRACT
  Filled 2020-03-07: qty 5

## 2020-03-07 MED ORDER — ONDANSETRON HCL 4 MG/2ML IJ SOLN
4.0000 mg | Freq: Once | INTRAMUSCULAR | Status: AC
  Administered 2020-03-07: 4 mg via INTRAVENOUS
  Filled 2020-03-07: qty 2

## 2020-03-07 MED ORDER — INSULIN ASPART 100 UNIT/ML ~~LOC~~ SOLN: 0.0000 [IU] | Freq: Every day | SUBCUTANEOUS | Status: DC

## 2020-03-07 MED ORDER — ISOSORBIDE MONONITRATE ER 60 MG PO TB24
120.0000 mg | ORAL_TABLET | Freq: Every day | ORAL | Status: DC
  Administered 2020-03-07 – 2020-03-11 (×5): 120 mg via ORAL
  Filled 2020-03-07 (×5): qty 2

## 2020-03-07 MED ORDER — VENLAFAXINE HCL ER 75 MG PO CP24: 75.0000 mg | ORAL_CAPSULE | Freq: Every day | ORAL | Status: DC

## 2020-03-07 MED ORDER — FENTANYL CITRATE (PF) 100 MCG/2ML IJ SOLN
50.0000 ug | Freq: Once | INTRAMUSCULAR | Status: AC
  Administered 2020-03-07: 50 ug via INTRAVENOUS
  Filled 2020-03-07: qty 2

## 2020-03-07 NOTE — ED Notes (Signed)
Full rainbow drawn and sent to lab.

## 2020-03-07 NOTE — ED Provider Notes (Signed)
[facility] Emergency Department Provider Note  ____________________________________________   First MD Initiated Contact with Patient 03/07/20 1340     (approximate)  I have reviewed the triage vital signs and the nursing notes.  History  Chief Complaint Chest Pain    HPI Lawrence [name] is a 64 y.o. male past medical history as below, notable for significant CAD, status post CABG x 2 as well as stenting, who presents to the emergency department for chest pain.  Patient states his chest pain first started yesterday and has been constant since onset.  Located in the center of his chest, described as a heaviness.  Moderate in severity.  Associated with nausea.  No radiation.  Has baseline, chronic shortness of breath in the setting of COPD.  He took some nitroglycerin yesterday which eased off his pain for a small amount of time and was able to sleep, but upon awakening this morning, his pain persisted and was more severe, 9/10, which prompted him to seek care.  Took 4 x ASA prior to arrival with no improvement in pain.    Past Medical Hx Past Medical History:  Diagnosis Date  . A-fib ([place])   . Anemia   . Anginal pain ([place])   . Anxiety   . Arthritis   . Asthma   . CAD (coronary artery disease)    a. 2002 CABGx2 (LIMA->LAD, VG->VG->OM1);  b. 09/2012 DES->OM;  c. 03/2015 PTCA of LAD [facility]) in setting of atretic LIMA; d. 05/2015 Cath [facility]): nonobs dzs; e. 06/2015 Cath ([facility]): LM nl, LAD 45p/d ISR, 50d, D1/2 small, LCX 50p/d ISR, OM1 70ost, 30 ISR, VG->OM1 50ost, 75m LIMA->LAD 99p/d - atretic, RCA dom, nl; f.cath 10/16: 40-50%(FFR 0.90) pLAD, 75% (FFR 0.77) mLAD s/p PCI/DES, oRCA 40% (FFR0.95)  . Cancer ([place]    SKIN CANCER ON BACK  . Celiac disease   . Chronic diastolic CHF (congestive heart failure) ([place]    a. 06/2009 Echo: EF 60-65%, Gr 1 DD, triv AI, mildly dil LA, nl RV.  .Marland KitchenCOPD (chronic obstructive pulmonary disease) ([place]    a. Chronic bronchitis and  emphysema.  . DDD (degenerative disc disease), lumbar   . Diverticulosis   . Dysrhythmia   . Essential hypertension   . GERD (gastroesophageal reflux disease)   . History of hiatal hernia   . History of kidney stones    H/O  . History of tobacco abuse    a. Quit 2014.  .Marland KitchenMyocardial infarction ([place] 2002   4 STENTS  . Pancreatitis   . PSVT (paroxysmal supraventricular tachycardia) ([place]    a. 10/2012 Noted on Zio Patch.  . Sleep apnea    LOST CORD TO CPAP -ONLY 02 @ BEDTIME  . Tubular adenoma of colon   . Type II diabetes mellitus ([place]     Problem List Patient Active Problem List   Diagnosis Date Noted  . Bereavement 09/09/2019  . Chronic respiratory failure with hypoxia ([place] 05/29/2019  . Morbid obesity ([place] 05/29/2019  . Trigger thumb of right hand 11/28/2018  . Eye pain, left 08/18/2018  . Acute on chronic heart failure ([place] 04/18/2018  . History of adenomatous polyp of colon 04/05/2018  . Abdominal pain, chronic, epigastric 11/06/2017  . Bilateral flank pain 03/24/2017  . Dyspnea 04/03/2016  . Hypotension 04/03/2016  . CKD (chronic kidney disease) stage 2, GFR 60-89 ml/min 04/03/2016  . Anemia 04/03/2016  . Paroxysmal atrial fibrillation ([place] 12/23/2015  . OSA (obstructive sleep apnea) 12/10/2015  .  Left inguinal hernia 11/07/2015  . Anxiety 11/07/2015  . Unstable angina ([place]) 10/05/2015  . Back pain with left-sided radiculopathy 09/30/2015  . Nocturnal hypoxia 09/06/2015  . BPH (benign prostatic hyperplasia) 08/01/2015  . Chronic diastolic CHF (congestive heart failure) ([place])   . Angina pectoris ([place])   . Chest pain 07/11/2015  . COPD (chronic obstructive pulmonary disease) ([place]) 07/03/2015  . CAD (coronary artery disease) 06/26/2015  . HTN (hypertension) 06/26/2015  . 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[place]) 06/26/2015  . Achalasia 07/24/2014  . GERD (gastroesophageal reflux disease) 06/07/2014  . Former tobacco use 04/11/2013  . HLD  (hyperlipidemia) 04/09/2013    Past Surgical Hx Past Surgical History:  Procedure Laterality Date  . BYPASS GRAFT    . CARDIAC CATHETERIZATION N/A 07/12/2015   rocedure: Left Heart Cath and Cors/Grafts Angiography;  Surgeon: Belva Crome, MD;  Location: [place] [facility];  Service: Cardiovascular;  Laterality: N/A;  . CARDIAC CATHETERIZATION Right 10/07/2015   Procedure: Left Heart Cath and Cors/Grafts Angiography;  Surgeon: Dionisio David, MD;  Location: [place] [facility];  Service: Cardiovascular;  Laterality: Right;  . CARDIAC CATHETERIZATION N/A 04/06/2016   Procedure: Left Heart Cath and Coronary Angiography;  Surgeon: Yolonda Kida, MD;  Location: [place] [facility];  Service: Cardiovascular;  Laterality: N/A;  . CARDIAC CATHETERIZATION  04/06/2016   Procedure: Bypass Graft Angiography;  Surgeon: Yolonda Kida, MD;  Location: [place] [facility];  Service: Cardiovascular;;  . CARDIAC CATHETERIZATION N/A 11/02/2016   Procedure: Left Heart Cath and Cors/Grafts Angiography and possible PCI;  Surgeon: Yolonda Kida, MD;  Location: [place] [facility];  Service: Cardiovascular;  Laterality: N/A;  . CARDIAC CATHETERIZATION N/A 11/02/2016   Procedure: Coronary Stent Intervention;  Surgeon: Yolonda Kida, MD;  Location: [place] [facility];  Service: Cardiovascular;  Laterality: N/A;  . CHOLECYSTECTOMY    . CIRCUMCISION N/A 06/09/2019   Procedure: CIRCUMCISION ADULT;  Surgeon: Billey Co, MD;  Location: [facility] ORS;  Service: Urology;  Laterality: N/A;  . COLONOSCOPY WITH PROPOFOL N/A 04/01/2018   Procedure: COLONOSCOPY WITH PROPOFOL;  Surgeon: Manya Silvas, MD;  Location: [facility] ENDOSCOPY;  Service: Endoscopy;  Laterality: N/A;  . ESOPHAGEAL DILATION    . ESOPHAGOGASTRODUODENOSCOPY (EGD) WITH PROPOFOL N/A 04/01/2018   Procedure: ESOPHAGOGASTRODUODENOSCOPY (EGD) WITH PROPOFOL;  Surgeon: Manya Silvas, MD;  Location: [facility] ENDOSCOPY;  Service: Endoscopy;   Laterality: N/A;  . LEFT HEART CATH AND CORS/GRAFTS ANGIOGRAPHY N/A 06/12/2019   Procedure: LEFT HEART CATH AND CORS/GRAFTS ANGIOGRAPHY;  Surgeon: Teodoro Spray, MD;  Location: [place] [facility];  Service: Cardiovascular;  Laterality: N/A;  . TONSILLECTOMY    . VASCULAR SURGERY      Medications Prior to Admission medications   Medication Sig Start Date End Date Taking? Authorizing Provider  albuterol (VENTOLIN HFA) 108 (90 Base) MCG/ACT inhaler INHALE 2 PUFFS BY MOUTH EVERY 6 HOURS AS NEEDED FOR SHORTNESS OF BREATH 12/22/19   Birdie Sons, MD  Albuterol Sulfate, sensor, 108 (90 Base) MCG/ACT AEPB Inhale 2 puffs into the lungs at bedtime. AND PRN    [provider]  allopurinol (ZYLOPRIM) 300 MG tablet TAKE 1 TABLET BY MOUTH TWICE A DAY 11/14/19   Birdie Sons, MD  ALPRAZolam Duanne Moron) 1 MG tablet Take 1 tablet (1 mg total) by mouth 3 (three) times daily. 12/07/19   Birdie Sons, MD  arformoterol (BROVANA) 15 MCG/2ML NEBU Take 15 mcg by nebulization 2 (two) times daily.  [provider]  aspirin 81 MG chewable tablet Chew 81 mg by mouth daily.    [provider]  atorvastatin (LIPITOR) 80 MG tablet TAKE ONE TABLET BY MOUTH AT BEDTIME 10/13/19   Birdie Sons, MD  Blood Glucose Monitoring Suppl (ACCU-CHEK AVIVA PLUS) w/Device KIT CHECK BLOOD SUGAR EVERY DAY 12/08/17   Birdie Sons, MD  BREO ELLIPTA 100-25 MCG/INH AEPB INHALE 1 PUFF INTO THE LUNGS DAILY 02/03/20   Birdie Sons, MD  budesonide (PULMICORT) 0.5 MG/2ML nebulizer solution Take 0.5 mg by nebulization 2 (two) times daily.    [provider]  cetirizine (ZYRTEC) 10 MG tablet TAKE ONE TABLET BY MOUTH AT BEDTIME 08/01/19   Birdie Sons, MD  Cholecalciferol (VITAMIN D3) 125 MCG (5000 UT) CAPS Take 5,000 Units by mouth daily.    [provider]  citalopram (CELEXA) 20 MG tablet Take 20 mg by mouth daily.    [provider]  dapagliflozin propanediol (FARXIGA) 10  MG TABS tablet Take 10 mg by mouth daily.    [provider]  docusate sodium (COLACE) 100 MG capsule Take 100 mg by mouth daily.     [provider]  ELIQUIS 5 MG TABS tablet TAKE 1 TABLET BY MOUTH TWICE A DAY 12/22/19   Birdie Sons, MD  glucose blood (ACCU-CHEK AVIVA PLUS) test strip Use to check blood sugar once a day 02/14/18   Birdie Sons, MD  isosorbide mononitrate (IMDUR) 120 MG 24 hr tablet Take 1 tablet (120 mg total) by mouth daily. 08/10/19   Birdie Sons, MD  LINZESS 72 MCG capsule Take 72 mcg by mouth daily before breakfast.     [provider]  lisinopril (ZESTRIL) 10 MG tablet Take 10 mg by mouth daily.    [provider]  magnesium oxide (MAG-OX) 400 (241.3 Mg) MG tablet Take 1 tablet (400 mg total) by mouth every morning. 06/13/19   Alisa Graff, FNP  methocarbamol (ROBAXIN) 500 MG tablet TAKE 1 TABLET BY MOUTH 4 TIMES DAILY 10/09/19   Birdie Sons, MD  nitroGLYCERIN (NITROSTAT) 0.4 MG SL tablet Place 1 tablet (0.4 mg total) under the tongue every 5 (five) minutes as needed for chest pain. Reported on 05/26/2016 11/28/18   Birdie Sons, MD  omega-3 acid ethyl esters (LOVAZA) 1 g capsule Take 4 g by mouth daily.    [provider]  omeprazole (PRILOSEC) 40 MG capsule TAKE 1 CAPSULE BY MOUTH TWICE DAILY 30 MINUTES PRIOR TO BREAKFAST ANDDINNER. 08/28/19   [provider]  oxyCODONE-acetaminophen (PERCOCET) 10-325 MG tablet TAKE 1 TABLET BY MOUTH EVERY 4 HOURS 02/26/20   Birdie Sons, MD  OXYGEN Inhale 2 L into the lungs continuous.     [provider]  ranolazine (RANEXA) 500 MG 12 hr tablet Take 2 tablets (1,000 mg total) by mouth 2 (two) times daily. 09/10/19   [name], MD  sotalol (BETAPACE) 80 MG tablet Take 80 mg by mouth 2 (two) times daily.    [provider]  tiotropium (SPIRIVA) 18 MCG inhalation capsule Place 18 mcg into inhaler and inhale daily.    [provider]    torsemide (DEMADEX) 100 MG tablet Take 50 mg by mouth daily.  10/11/17   [provider]  traZODone (DESYREL) 100 MG tablet Take 100 mg by mouth at bedtime.    [provider]  venlafaxine XR (EFFEXOR-XR) 75 MG 24 hr capsule Take 75 mg by mouth daily  with breakfast.    [provider]    Allergies Demerol  [meperidine hcl], Demerol [meperidine], Jardiance [empagliflozin], Prednisone, Sulfa antibiotics, Albuterol sulfate [albuterol], and Morphine sulfate  Family Hx Family History  Problem Relation Age of Onset  . Heart attack Mother   . Depression Mother   . Heart disease Mother   . COPD Mother   . Hypertension Mother   . Heart attack Father   . Diabetes Father   . Depression Father   . Heart disease Father   . Cirrhosis Father   . Parkinson's disease Brother     Social Hx Social History   Tobacco Use  . Smoking status: Former Smoker    Packs/day: 3.00    Years: 50.00    Pack years: 150.00    Types: Cigarettes    Quit date: 04/22/2013    Years since quitting: 6.8  . Smokeless tobacco: Never Used  Substance Use Topics  . Alcohol use: No    Comment: remotely quit alcohol use. Hx of heavy alcohol use.  . Drug use: No     Review of Systems  Constitutional: Negative for fever. Negative for chills. Eyes: Negative for visual changes. ENT: Negative for sore throat. Cardiovascular: Positive for chest pain. Respiratory: Negative for shortness of breath. Gastrointestinal: Negative for nausea. Negative for vomiting.  Genitourinary: Negative for dysuria. Musculoskeletal: Negative for leg swelling. Skin: Negative for rash. Neurological: Negative for headaches.   Physical Exam  Vital Signs: ED Triage Vitals  Enc Vitals Group     BP 03/07/20 1225 (!) 154/82     Pulse Rate 03/07/20 1224 61     Resp 03/07/20 1224 18     Temp 03/07/20 1224 (!) 97.5 F (36.4 C)     Temp Source 03/07/20 1224 Oral     SpO2 03/07/20 1224 96 %     Weight 03/07/20  1224 227 lb (103 kg)     Height 03/07/20 1224 5' 7"  (1.702 m)     Head Circumference --      Peak Flow --      Pain Score 03/07/20 1219 9     Pain Loc --      Pain Edu? --      Excl. in [place]? --     Constitutional: Alert and oriented. Well appearing. NAD.  Head: Normocephalic. Atraumatic. Eyes: Conjunctivae clear. Sclera anicteric. Pupils equal and symmetric. Nose: No masses or lesions. No congestion or rhinorrhea. Mouth/Throat: Wearing mask.  Neck: No stridor. Trachea midline.  Cardiovascular: Normal rate, regular rhythm. Extremities well perfused. Respiratory: Normal respiratory effort.  Lungs CTAB. Gastrointestinal: Soft. Non-distended. Non-tender.  Genitourinary: Deferred. Musculoskeletal: Trace pretibial extremity edema. No deformities. Neurologic:  Normal speech and language. No gross focal or lateralizing neurologic deficits are appreciated.  Skin: Skin is warm, dry and intact. No rash noted. Psychiatric: Mood and affect are appropriate for situation.  EKG  Personally reviewed and interpreted by myself.   Date: 03/07/20 Time: 1220 Rate: 60 Rhythm: sinus Axis: normal Intervals: WNL TWI V3-V6, present previously No STEMI    Radiology  Personally reviewed available imaging myself.   CXR - IMPRESSION:  1. No acute cardiopulmonary disease.  2. Hyperexpanded lungs suggesting COPD.     Procedures  Procedure(s) performed (including critical care):  .Critical Care Performed by: Lilia Pro., MD Authorized by: Lilia Pro., MD   Critical care provider statement:    Critical care time (minutes):  35   Critical care was time spent personally by me  on the following activities:  Discussions with consultants, evaluation of patient's response to treatment, examination of patient, ordering and performing treatments and interventions, ordering and review of laboratory studies, ordering and review of radiographic studies, pulse oximetry, re-evaluation of patient's  condition, obtaining history from patient or surrogate and review of old charts .1-3 Lead EKG Interpretation Performed by: Lilia Pro., MD Authorized by: Lilia Pro., MD     Interpretation: normal     ECG rate assessment: normal     Rhythm: sinus rhythm   Comments:     Interpretation: Normal sinus rhythm     Initial Impression / Assessment and Plan / MDM / ED Course  64 y.o. male with significant cardiac history who presents to the ED for chest pain since yesterday  Ddx: ACS, unstable angina, GERD, pulmonary infection  Will plan for labs, EKG, cardiac monitoring.  Has already taken 4 ASA today.  Will give morphine/Zofran for symptom control.  EKG as above.  Initial high-sensitivity troponin 152.  On chart review, has had normal high-sensitivity troponins previously.  Given his elevated troponin as well as ongoing chest pain, and significant risk factors, concern for NSTEMI.  Will initiate heparin drip.  Patient is agreeable.  Will admit.   _______________________________   As part of my medical decision making I have reviewed available labs, radiology tests, reviewed old records/performed chart review.   Final Clinical Impression(s) / ED Diagnosis  Final diagnoses:  Chest pain in adult  NSTEMI (non-ST elevated myocardial infarction) [facility])       Note:  This document was prepared using Dragon voice recognition software and may include unintentional dictation errors.   Lilia Pro., MD 03/07/20 1434

## 2020-03-07 NOTE — H&P (Signed)
History and Physical    Lawrence [name]:[id number] DOB: 1956/04/29 DOA: 03/07/2020  Referring MD/NP/PA:   PCP: Birdie Sons, MD   Patient coming from:  The patient is coming from home.  At baseline, pt is independent for most of ADL.        Chief Complaint: chest pain  HPI: Lawrence [name] is a 64 y.o. male with medical history significant of hypertension, hyperlipidemia, diabetes mellitus, COPD, asthma, GERD, anxiety, OSA, PS VT, pancreatitis, former smoker, dCHF, CAD, CABG, stent placement, atrial fibrillation on Eliquis, who presents with chest pain.  Patient states that his chest pain started yesterday, which is located in the substernal area, pressure-like, 9 out of 10 in severity, radiating to the left arm.  Patient has chronic shortness breath and mild cough due to COPD, which has not changed.  No fever or chills.  Patient has nausea, but no vomiting, diarrhea or abdominal pain.  No symptoms of UTI. Of note, patient states that he is allergic to morphine tablet, but can tolerate IV morphine.  ED Course: pt was found to have troponin 152, 149, INR 1.0, pending COVID-19 PCR, WBC 6.2, renal function at baseline, temperature 97.5, blood pressure 176/101, heart rate 56, oxygen saturation 98% on room air.  Chest x-ray showed COPD without no infiltration.  Patient is placed on progressive unit for observation.  Cardiology, Dr. Saralyn Pilar is consulted.  Review of Systems:   General: no fevers, chills, no body weight gain, has fatigue HEENT: no blurry vision, hearing changes or sore throat Respiratory: has dyspnea, coughing, no wheezing CV: has chest pain, no palpitations GI: has nausea, no vomiting, abdominal pain, diarrhea, constipation GU: no dysuria, burning on urination, increased urinary frequency, hematuria  Ext: no leg edema Neuro: no unilateral weakness, numbness, or tingling, no vision change or hearing loss Skin: no rash, no skin tear. MSK: No muscle spasm, no deformity,  no limitation of range of movement in spin Heme: No easy bruising.  Travel history: No recent long distant travel.  Allergy:  Allergies  Allergen Reactions  . Demerol  [Meperidine Hcl]   . Demerol [Meperidine] Hives  . Jardiance [Empagliflozin] Other (See Comments)    Perineal pain  . Prednisone Other (See Comments) and Hypertension    Pt states that this medication puts him in A-fib   . Sulfa Antibiotics Hives  . Albuterol Sulfate [Albuterol] Palpitations and Other (See Comments)    Pt currently uses this medication.    . Morphine Sulfate Nausea And Vomiting, Rash and Other (See Comments)    Pt states that he is only allergic to the tablet form of this medication.      Past Medical History:  Diagnosis Date  . A-fib ([place])   . Anemia   . Anginal pain ([place])   . Anxiety   . Arthritis   . Asthma   . CAD (coronary artery disease)    a. 2002 CABGx2 (LIMA->LAD, VG->VG->OM1);  b. 09/2012 DES->OM;  c. 03/2015 PTCA of LAD [facility]) in setting of atretic LIMA; d. 05/2015 Cath [facility]): nonobs dzs; e. 06/2015 Cath ([facility]): LM nl, LAD 45p/d ISR, 50d, D1/2 small, LCX 50p/d ISR, OM1 70ost, 30 ISR, VG->OM1 50ost, 49m LIMA->LAD 99p/d - atretic, RCA dom, nl; f.cath 10/16: 40-50%(FFR 0.90) pLAD, 75% (FFR 0.77) mLAD s/p PCI/DES, oRCA 40% (FFR0.95)  . Cancer ([place]    SKIN CANCER ON BACK  . Celiac disease   . Chronic diastolic CHF (congestive heart failure) ([place]    a. 06/2009  Echo: EF 60-65%, Gr 1 DD, triv AI, mildly dil LA, nl RV.  Marland Kitchen COPD (chronic obstructive pulmonary disease) ([place])    a. Chronic bronchitis and emphysema.  . DDD (degenerative disc disease), lumbar   . Diverticulosis   . Dysrhythmia   . Essential hypertension   . GERD (gastroesophageal reflux disease)   . History of hiatal hernia   . History of kidney stones    H/O  . History of tobacco abuse    a. Quit 2014.  Marland Kitchen Myocardial infarction ([place]) 2002   4 STENTS  . Pancreatitis   . PSVT (paroxysmal supraventricular tachycardia) ([place])     a. 10/2012 Noted on Zio Patch.  . Sleep apnea    LOST CORD TO CPAP -ONLY 02 @ BEDTIME  . Tubular adenoma of colon   . Type II diabetes mellitus ([place])     Past Surgical History:  Procedure Laterality Date  . BYPASS GRAFT    . CARDIAC CATHETERIZATION N/A 07/12/2015   rocedure: Left Heart Cath and Cors/Grafts Angiography;  Surgeon: Belva Crome, MD;  Location: [place] CV LAB;  Service: Cardiovascular;  Laterality: N/A;  . CARDIAC CATHETERIZATION Right 10/07/2015   Procedure: Left Heart Cath and Cors/Grafts Angiography;  Surgeon: Dionisio David, MD;  Location: [place] CV LAB;  Service: Cardiovascular;  Laterality: Right;  . CARDIAC CATHETERIZATION N/A 04/06/2016   Procedure: Left Heart Cath and Coronary Angiography;  Surgeon: Yolonda Kida, MD;  Location: [place] CV LAB;  Service: Cardiovascular;  Laterality: N/A;  . CARDIAC CATHETERIZATION  04/06/2016   Procedure: Bypass Graft Angiography;  Surgeon: Yolonda Kida, MD;  Location: [place] CV LAB;  Service: Cardiovascular;;  . CARDIAC CATHETERIZATION N/A 11/02/2016   Procedure: Left Heart Cath and Cors/Grafts Angiography and possible PCI;  Surgeon: Yolonda Kida, MD;  Location: [place] CV LAB;  Service: Cardiovascular;  Laterality: N/A;  . CARDIAC CATHETERIZATION N/A 11/02/2016   Procedure: Coronary Stent Intervention;  Surgeon: Yolonda Kida, MD;  Location: [place] CV LAB;  Service: Cardiovascular;  Laterality: N/A;  . CHOLECYSTECTOMY    . CIRCUMCISION N/A 06/09/2019   Procedure: CIRCUMCISION ADULT;  Surgeon: Billey Co, MD;  Location: [facility] ORS;  Service: Urology;  Laterality: N/A;  . COLONOSCOPY WITH PROPOFOL N/A 04/01/2018   Procedure: COLONOSCOPY WITH PROPOFOL;  Surgeon: Manya Silvas, MD;  Location: [facility] ENDOSCOPY;  Service: Endoscopy;  Laterality: N/A;  . ESOPHAGEAL DILATION    . ESOPHAGOGASTRODUODENOSCOPY (EGD) WITH PROPOFOL N/A 04/01/2018   Procedure: ESOPHAGOGASTRODUODENOSCOPY  (EGD) WITH PROPOFOL;  Surgeon: Manya Silvas, MD;  Location: [facility] ENDOSCOPY;  Service: Endoscopy;  Laterality: N/A;  . LEFT HEART CATH AND CORS/GRAFTS ANGIOGRAPHY N/A 06/12/2019   Procedure: LEFT HEART CATH AND CORS/GRAFTS ANGIOGRAPHY;  Surgeon: Teodoro Spray, MD;  Location: [place] CV LAB;  Service: Cardiovascular;  Laterality: N/A;  . TONSILLECTOMY    . VASCULAR SURGERY      Social History:  reports that he quit smoking about 6 years ago. His smoking use included cigarettes. He has a 150.00 pack-year smoking history. He has never used smokeless tobacco. He reports that he does not drink alcohol or use drugs.  Family History:  Family History  Problem Relation Age of Onset  . Heart attack Mother   . Depression Mother   . Heart disease Mother   . COPD Mother   . Hypertension Mother   . Heart attack Father   . Diabetes Father   . Depression Father   .  Heart disease Father   . Cirrhosis Father   . Parkinson's disease Brother      Prior to Admission medications   Medication Sig Start Date End Date Taking? Authorizing Provider  allopurinol (ZYLOPRIM) 300 MG tablet TAKE 1 TABLET BY MOUTH TWICE A DAY 11/14/19  Yes Birdie Sons, MD  ALPRAZolam Duanne Moron) 1 MG tablet Take 1 tablet (1 mg total) by mouth 3 (three) times daily. 12/07/19  Yes Birdie Sons, MD  aspirin 81 MG chewable tablet Chew 81 mg by mouth daily.   Yes [provider]  atorvastatin (LIPITOR) 80 MG tablet TAKE ONE TABLET BY MOUTH AT BEDTIME 10/13/19  Yes Birdie Sons, MD  budesonide (PULMICORT) 0.5 MG/2ML nebulizer solution Take 0.5 mg by nebulization 2 (two) times daily.   Yes [provider]  cetirizine (ZYRTEC) 10 MG tablet TAKE ONE TABLET BY MOUTH AT BEDTIME 08/01/19  Yes Birdie Sons, MD  Cholecalciferol (VITAMIN D3) 125 MCG (5000 UT) CAPS Take 5,000 Units by mouth daily.   Yes [provider]  docusate sodium (COLACE) 100 MG capsule Take 100 mg by mouth daily.    Yes  [provider]  magnesium oxide (MAG-OX) 400 (241.3 Mg) MG tablet Take 1 tablet (400 mg total) by mouth every morning. 06/13/19  Yes Darylene Price A, FNP  nitroGLYCERIN (NITROSTAT) 0.4 MG SL tablet Place 1 tablet (0.4 mg total) under the tongue every 5 (five) minutes as needed for chest pain. Reported on 05/26/2016 11/28/18  Yes Birdie Sons, MD  omega-3 acid ethyl esters (LOVAZA) 1 g capsule Take 4 g by mouth daily.   Yes [provider]  oxyCODONE-acetaminophen (PERCOCET) 10-325 MG tablet TAKE 1 TABLET BY MOUTH EVERY 4 HOURS 02/26/20  Yes Birdie Sons, MD  ranolazine (RANEXA) 1000 MG SR tablet Take 1,000 mg by mouth 2 (two) times daily.   Yes [provider]  tiotropium (SPIRIVA) 18 MCG inhalation capsule Place 18 mcg into inhaler and inhale daily.   Yes [provider]  torsemide (DEMADEX) 100 MG tablet Take 50 mg by mouth daily.  10/11/17  Yes [provider]  Albuterol Sulfate, sensor, 108 (90 Base) MCG/ACT AEPB Inhale 2 puffs into the lungs at bedtime. AND PRN    [provider]  arformoterol (BROVANA) 15 MCG/2ML NEBU Take 15 mcg by nebulization 2 (two) times daily.    [provider]  BREO ELLIPTA 100-25 MCG/INH AEPB INHALE 1 PUFF INTO THE LUNGS DAILY Patient not taking: Reported on 03/07/2020 02/03/20   Birdie Sons, MD  dapagliflozin propanediol (FARXIGA) 10 MG TABS tablet Take 10 mg by mouth daily.    [provider]  ELIQUIS 5 MG TABS tablet TAKE 1 TABLET BY MOUTH TWICE A DAY Patient not taking: TAKE 1 TABLET BY MOUTH TWICE A DAY 12/22/19   Birdie Sons, MD  isosorbide mononitrate (IMDUR) 120 MG 24 hr tablet Take 1 tablet (120 mg total) by mouth daily. Patient not taking: Reported on 03/07/2020 08/10/19   Birdie Sons, MD  LINZESS 72 MCG capsule Take 72 mcg by mouth daily before breakfast.     [provider]  lisinopril (ZESTRIL) 10 MG tablet Take 10 mg by mouth daily.    [provider]   methocarbamol (ROBAXIN) 500 MG tablet TAKE 1 TABLET BY MOUTH 4 TIMES DAILY Patient not taking: TAKE 1 TABLET BY MOUTH 4 TIMES DAILY 10/09/19   Birdie Sons, MD  sotalol (BETAPACE) 80 MG tablet Take  80 mg by mouth 2 (two) times daily.    [provider]  traZODone (DESYREL) 100 MG tablet Take 100 mg by mouth at bedtime.    [provider]  venlafaxine XR (EFFEXOR-XR) 75 MG 24 hr capsule Take 75 mg by mouth daily with breakfast.    [provider]    Physical Exam: Vitals:   03/07/20 1224 03/07/20 1225 03/07/20 1343  BP:  (!) 154/82 (!) 176/101  Pulse: 61  (!) 56  Resp: 18  15  Temp: (!) 97.5 F (36.4 C)    TempSrc: Oral    SpO2: 96%  98%  Weight: 103 kg    Height: 5' 7"  (1.702 m)     General: Not in acute distress HEENT:       Eyes: PERRL, EOMI, no scleral icterus.       ENT: No discharge from the ears and nose, no pharynx injection, no tonsillar enlargement.        Neck: No JVD, no bruit, no mass felt. Heme: No neck lymph node enlargement. Cardiac: S1/S2, RRR, No murmurs, No gallops or rubs. Respiratory: No rales, wheezing, rhonchi or rubs. GI: Soft, nondistended, nontender, no rebound pain, no organomegaly, BS present. GU: No hematuria Ext: No pitting leg edema bilaterally. 2+DP/PT pulse bilaterally. Musculoskeletal: No joint deformities, No joint redness or warmth, no limitation of ROM in spin. Skin: No rashes.  Neuro: Alert, oriented X3, cranial nerves II-XII grossly intact, moves all extremities normally.  Psych: Patient is not psychotic, no suicidal or hemocidal ideation.  Labs on Admission: I have personally reviewed following labs and imaging studies  CBC: Recent Labs  Lab 03/07/20 1227  WBC 6.2  HGB 17.7*  HCT 55.4*  MCV 93.9  PLT 997   Basic Metabolic Panel: Recent Labs  Lab 03/07/20 1227  NA 137  K 4.3  CL 98  CO2 30  GLUCOSE 307*  BUN 12  CREATININE 1.01  CALCIUM 9.2   GFR: Estimated Creatinine Clearance: 82.3  mL/min (by C-G formula based on SCr of 1.01 mg/dL). Liver Function Tests: No results for input(s): AST, ALT, ALKPHOS, BILITOT, PROT, ALBUMIN in the last 168 hours. No results for input(s): LIPASE, AMYLASE in the last 168 hours. No results for input(s): AMMONIA in the last 168 hours. Coagulation Profile: Recent Labs  Lab 03/07/20 1408  INR 1.0   Cardiac Enzymes: No results for input(s): CKTOTAL, CKMB, CKMBINDEX, TROPONINI in the last 168 hours. BNP (last 3 results) No results for input(s): PROBNP in the last 8760 hours. HbA1C: No results for input(s): HGBA1C in the last 72 hours. CBG: Recent Labs  Lab 03/07/20 1643  GLUCAP 198*   Lipid Profile: No results for input(s): CHOL, HDL, LDLCALC, TRIG, CHOLHDL, LDLDIRECT in the last 72 hours. Thyroid Function Tests: No results for input(s): TSH, T4TOTAL, FREET4, T3FREE, THYROIDAB in the last 72 hours. Anemia Panel: No results for input(s): VITAMINB12, FOLATE, FERRITIN, TIBC, IRON, RETICCTPCT in the last 72 hours. Urine analysis:    Component Value Date/Time   COLORURINE AMBER (A) 10/03/2019 2002   APPEARANCEUR CLEAR (A) 10/03/2019 2002   APPEARANCEUR Clear 05/25/2019 1554   LABSPEC 1.037 (H) 10/03/2019 2002   LABSPEC 1.012 03/07/2015 2143   PHURINE 5.0 10/03/2019 2002   GLUCOSEU >=500 (A) 10/03/2019 2002   GLUCOSEU Negative 03/07/2015 2143   HGBUR NEGATIVE 10/03/2019 2002   [place] NEGATIVE 10/03/2019 2002   BILIRUBINUR Negative 05/25/2019 1554   BILIRUBINUR Negative 03/07/2015 2143   [place] NEGATIVE 10/03/2019 2002  PROTEINUR 100 (A) 10/03/2019 2002   UROBILINOGEN 0.2 03/26/2017 0907   NITRITE NEGATIVE 10/03/2019 2002   LEUKOCYTESUR NEGATIVE 10/03/2019 2002   LEUKOCYTESUR Negative 03/07/2015 2143   Sepsis Labs: @LABRCNTIP (procalcitonin:4,lacticidven:4) )No results found for this or any previous visit (from the past 240 hour(s)).   Radiological Exams on Admission: DG Chest 2 View  Result Date:  03/07/2020 CLINICAL DATA:  Pt presents to ED c/o L-sided chest pain 9/10 starting yesterday. Pt states he took 3 NTG yesterday with relief and 3 today, last one at 1200, with no relief today. Extensive cardiac hx, states last cath last year. former smoker quit in 2014 EXAM: CHEST - 2 VIEW COMPARISON:  10/04/2019 FINDINGS: Stable changes from previous cardiac surgery. The cardiac silhouette is normal in size and configuration. No mediastinal or hilar masses. Linear opacities are noted in the lung bases consistent a combination of prominent bronchovascular markings and scarring or atelectasis. Lungs are hyperexpanded but otherwise clear. No pleural effusion or pneumothorax. Skeletal structures are demineralized but intact. IMPRESSION: 1. No acute cardiopulmonary disease. 2. Hyperexpanded lungs suggesting COPD. Electronically Signed   By: Lajean Manes M.D.   On: 03/07/2020 13:12     EKG: Independently reviewed.  Sinus rhythm, QTC 416, mild T wave inversion in V4-V6  Assessment/Plan Principal Problem:   Chest pain Active Problems:   HTN (hypertension)   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HCC)   HLD (hyperlipidemia)   COPD (chronic obstructive pulmonary disease) (HCC)   Chronic diastolic CHF (congestive heart failure) (HCC)   Anxiety   Paroxysmal atrial fibrillation (HCC)   Elevated troponin   Chest pain, elevated trop and hx of CAD: s/p of CABG and stent. Trop 152 -->149. Dr. Dr. Saralyn Pilar is consulted.  - place to progressive unit for observation - IV heparin started in the ED - Trend Trop - Repeat EKG in the am  - prn Nitroglycerin, Morphine, and aspirin, lipitor, imdur, ranexa - Risk factor stratification: will check FLP and A1C  - check UDS - f/u card recommendations  HTN:  -Continue home medications: Lisinopril, sotalol -hydralazine prn  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[place]): Most recent A1c 7.9, poorly controled. Patient is taking [place] at  home -SSI  HLD (hyperlipidemia): -lipitor  COPD (chronic obstructive pulmonary disease) [facility]): Bronchodilators  Chronic diastolic CHF (congestive heart failure) ([place]): 2D echo on 06/11/2019 showed EF of 50-55%.  Patient does not have leg edema.  No worsening shortness of breath.  Chest x-ray has no edema.  CHF is compensated. -Check BNP -hold torsemid  Anxiety: -Xanax  Paroxysmal atrial fibrillation ([place]): -continue sotalol -Patient was Eliquis --> now on IV heparin    DVT ppx: on IV Heparin Code Status: Full code Family Communication: not done, no family member is at bed side.    Disposition Plan:  Anticipate discharge back to previous home environment Consults called:  Dr. Saralyn Pilar Admission status:  progressive unit for obs   Status is: Observation The patient remains OBS appropriate and will d/c before 2 midnights. Dispo: The patient is from: Home              Anticipated d/c is to: Home              Anticipated d/c date is: 1 day              Patient currently is not medically stable to d/c.          Date of Service 03/07/2020    Ivor Costa [facility]  If 7PM-7AM, please contact night-coverage [email or web address] 03/07/2020, 5:53 PM

## 2020-03-07 NOTE — Consult Note (Addendum)
[place] for Heparin Indication: chest pain/ACS  Allergies  Allergen Reactions  . Demerol  [Meperidine Hcl]   . Demerol [Meperidine] Hives  . Jardiance [Empagliflozin] Other (See Comments)    Perineal pain  . Prednisone Other (See Comments) and Hypertension    Pt states that this medication puts him in A-fib   . Sulfa Antibiotics Hives  . Albuterol Sulfate [Albuterol] Palpitations and Other (See Comments)    Pt currently uses this medication.    . Morphine Sulfate Nausea And Vomiting, Rash and Other (See Comments)    Pt states that he is only allergic to the tablet form of this medication.      Patient Measurements: Height: 5' 7"  (170.2 cm) Weight: 103 kg (227 lb) IBW/kg (Calculated) : 66.1 Heparin Dosing Weight: 88.7 kg  Vital Signs: Temp: 97.5 F (36.4 C) (04/22 1224) Temp Source: Oral (04/22 1224) BP: 176/101 (04/22 1343) Pulse Rate: 56 (04/22 1343)  Labs: Recent Labs    03/07/20 1227  HGB 17.7*  HCT 55.4*  PLT 150  CREATININE 1.01  TROPONINIHS 152*    Estimated Creatinine Clearance: 82.3 mL/min (by C-G formula based on SCr of 1.01 mg/dL).   Medical History: Past Medical History:  Diagnosis Date  . A-fib ([place])   . Anemia   . Anginal pain ([place])   . Anxiety   . Arthritis   . Asthma   . CAD (coronary artery disease)    a. 2002 CABGx2 (LIMA->LAD, VG->VG->OM1);  b. 09/2012 DES->OM;  c. 03/2015 PTCA of LAD [facility]) in setting of atretic LIMA; d. 05/2015 Cath [facility]): nonobs dzs; e. 06/2015 Cath ([facility]): LM nl, LAD 45p/d ISR, 50d, D1/2 small, LCX 50p/d ISR, OM1 70ost, 30 ISR, VG->OM1 50ost, 73m LIMA->LAD 99p/d - atretic, RCA dom, nl; f.cath 10/16: 40-50%(FFR 0.90) pLAD, 75% (FFR 0.77) mLAD s/p PCI/DES, oRCA 40% (FFR0.95)  . Cancer ([place]    SKIN CANCER ON BACK  . Celiac disease   . Chronic diastolic CHF (congestive heart failure) ([place]    a. 06/2009 Echo: EF 60-65%, Gr 1 DD, triv AI, mildly dil LA, nl RV.  .Marland KitchenCOPD (chronic  obstructive pulmonary disease) ([place]    a. Chronic bronchitis and emphysema.  . DDD (degenerative disc disease), lumbar   . Diverticulosis   . Dysrhythmia   . Essential hypertension   . GERD (gastroesophageal reflux disease)   . History of hiatal hernia   . History of kidney stones    H/O  . History of tobacco abuse    a. Quit 2014.  .Mar[place] KitchenMyocardial infarction ([place] 2002   4 STENTS  . Pancreatitis   . PSVT (paroxysmal supraventricular tachycardia) ([place]    a. 10/2012 Noted on Zio Patch.  . Sleep apnea    LOST CORD TO CPAP -ONLY 02 @ BEDTIME  . Tubular adenoma of colon   . Type II diabetes mellitus (HCC)     Medications:  (Not in a hospital admission)  Scheduled:  .  morphine injection  4 mg Intravenous Once  . ondansetron (ZOFRAN) IV  4 mg Intravenous Once   Infusions:   PRN:  Anti-infectives (From admission, onward)   None      Assessment: Pharmacy consulted to start heparin for NSTEMI. Pt is on apixaban PTA for afib. hgb and plt stable. Will order baseline aPTT, HL, and INR. Will monitor with aPTT levels until heparin level and aPTT correlate.   Goal of Therapy:  APTT 66-102 seconds Heparin level 0.3-0.7  units/ml, once heparin and aPTT levels correlate.  Monitor platelets by anticoagulation protocol: Yes   Plan:  Give 4000 units bolus x 1 Start heparin infusion at 1200 units/hr Check aPTT level in 6 hours and daily while on heparin Continue to monitor H&H and platelets  Oswald Hillock, PharmD, BCPS 03/07/2020,2:01 PM

## 2020-03-07 NOTE — Consult Note (Signed)
[facility] Cardiology  CARDIOLOGY CONSULT NOTE  Patient ID: Lawrence [name] MRN: [id number] DOB/AGE: Mar 11, 1956 64 y.o.  Admit date: 03/07/2020 Referring Physician Blaine Hamper Primary Physician Primary Cardiologist [facility] Reason for Consultation chest pain  HPI: 64 year old gentleman referred for evaluation of chest pain.  Patient has known coronary artery disease, status post multiple hospitalizations for congestive heart failure and chest pain.  Patient was previously followed by Dr. [name], now followed at [facility].  He reports that he was in his usual state of health until recently when he noted increase sternal chest pain with shortness of breath.  Patient presented to [facility] ED where ECG revealed sinus rhythm with nonspecific T wave abnormalities laterally.  Admission labs notable for borderline elevated high-sensitivity troponin I52 and 149.  The patient has an extensive history of coronary artery disease.  He is status post anterior STEMI 04/27/2001, underwent coronary stent of proximal mid LAD resulting in dissection and emergency CABG x2 with LIMA to LAD and SVG to OM1 04/27/2001.  Patient has undergone multiple cardiac catheterizations, underwent Xience stent OM1 09/2012, balloon angioplasty mid LAD at the anastomotic site 04/01/2015, Xience stent ostium SVG OM 112/10/2016.  Most recent cardiac catheterization 06/12/2019 revealed atretic LIMA to LAD, patent SVG to OM1, patent stent proximal LAD, and patent stent proximal left circumflex, treated medically with normal left ventricular function.  Review of systems complete and found to be negative unless listed above     Past Medical History:  Diagnosis Date  . A-fib ([place])   . Anemia   . Anginal pain ([place])   . Anxiety   . Arthritis   . Asthma   . CAD (coronary artery disease)    a. 2002 CABGx2 (LIMA->LAD, VG->VG->OM1);  b. 09/2012 DES->OM;  c. 03/2015 PTCA of LAD [facility]) in setting of atretic LIMA; d. 05/2015 Cath [facility]): nonobs dzs; e. 06/2015  Cath ([facility]): LM nl, LAD 45p/d ISR, 50d, D1/2 small, LCX 50p/d ISR, OM1 70ost, 30 ISR, VG->OM1 50ost, 32m LIMA->LAD 99p/d - atretic, RCA dom, nl; f.cath 10/16: 40-50%(FFR 0.90) pLAD, 75% (FFR 0.77) mLAD s/p PCI/DES, oRCA 40% (FFR0.95)  . Cancer ([place]    SKIN CANCER ON BACK  . Celiac disease   . Chronic diastolic CHF (congestive heart failure) ([place]    a. 06/2009 Echo: EF 60-65%, Gr 1 DD, triv AI, mildly dil LA, nl RV.  .Marland KitchenCOPD (chronic obstructive pulmonary disease) ([place]    a. Chronic bronchitis and emphysema.  . DDD (degenerative disc disease), lumbar   . Diverticulosis   . Dysrhythmia   . Essential hypertension   . GERD (gastroesophageal reflux disease)   . History of hiatal hernia   . History of kidney stones    H/O  . History of tobacco abuse    a. Quit 2014.  .Marland KitchenMyocardial infarction ([place] 2002   4 STENTS  . Pancreatitis   . PSVT (paroxysmal supraventricular tachycardia) ([place]    a. 10/2012 Noted on Zio Patch.  . Sleep apnea    LOST CORD TO CPAP -ONLY 02 @ BEDTIME  . Tubular adenoma of colon   . Type II diabetes mellitus ([place]     Past Surgical History:  Procedure Laterality Date  . BYPASS GRAFT    . CARDIAC CATHETERIZATION N/A 07/12/2015   rocedure: Left Heart Cath and Cors/Grafts Angiography;  Surgeon: HBelva Crome MD;  [place] [place] [facility];  Service: Cardiovascular;  Laterality: N/A;  . CARDIAC CATHETERIZATION Right 10/07/2015   Procedure: Left Heart Cath and  Cors/Grafts Angiography;  Surgeon: Dionisio David, MD;  Location: [place] CV LAB;  Service: Cardiovascular;  Laterality: Right;  . CARDIAC CATHETERIZATION N/A 04/06/2016   Procedure: Left Heart Cath and Coronary Angiography;  Surgeon: Yolonda Kida, MD;  Location: [place] CV LAB;  Service: Cardiovascular;  Laterality: N/A;  . CARDIAC CATHETERIZATION  04/06/2016   Procedure: Bypass Graft Angiography;  Surgeon: Yolonda Kida, MD;  Location: [place] CV LAB;  Service: Cardiovascular;;  .  CARDIAC CATHETERIZATION N/A 11/02/2016   Procedure: Left Heart Cath and Cors/Grafts Angiography and possible PCI;  Surgeon: Yolonda Kida, MD;  Location: [place] CV LAB;  Service: Cardiovascular;  Laterality: N/A;  . CARDIAC CATHETERIZATION N/A 11/02/2016   Procedure: Coronary Stent Intervention;  Surgeon: Yolonda Kida, MD;  Location: [place] CV LAB;  Service: Cardiovascular;  Laterality: N/A;  . CHOLECYSTECTOMY    . CIRCUMCISION N/A 06/09/2019   Procedure: CIRCUMCISION ADULT;  Surgeon: Billey Co, MD;  Location: [facility] ORS;  Service: Urology;  Laterality: N/A;  . COLONOSCOPY WITH PROPOFOL N/A 04/01/2018   Procedure: COLONOSCOPY WITH PROPOFOL;  Surgeon: Manya Silvas, MD;  Location: [facility] ENDOSCOPY;  Service: Endoscopy;  Laterality: N/A;  . ESOPHAGEAL DILATION    . ESOPHAGOGASTRODUODENOSCOPY (EGD) WITH PROPOFOL N/A 04/01/2018   Procedure: ESOPHAGOGASTRODUODENOSCOPY (EGD) WITH PROPOFOL;  Surgeon: Manya Silvas, MD;  Location: [facility] ENDOSCOPY;  Service: Endoscopy;  Laterality: N/A;  . LEFT HEART CATH AND CORS/GRAFTS ANGIOGRAPHY N/A 06/12/2019   Procedure: LEFT HEART CATH AND CORS/GRAFTS ANGIOGRAPHY;  Surgeon: Teodoro Spray, MD;  Location: [place] CV LAB;  Service: Cardiovascular;  Laterality: N/A;  . TONSILLECTOMY    . VASCULAR SURGERY      (Not in a hospital admission)  Social History   Socioeconomic History  . Marital status: Widowed    Spouse name: Not on file  . Number of children: 1  . Years of education: Not on file  . Highest education level: 10th grade  Occupational History  . Occupation: Disabled  Tobacco Use  . Smoking status: Former Smoker    Packs/day: 3.00    Years: 50.00    Pack years: 150.00    Types: Cigarettes    Quit date: 04/22/2013    Years since quitting: 6.8  . Smokeless tobacco: Never Used  Substance and Sexual Activity  . Alcohol use: No    Comment: remotely quit alcohol use. Hx of heavy alcohol use.  . Drug use: No  .  Sexual activity: Not on file  Other Topics Concern  . Not on file  Social History Narrative   Pt lives in [place] with wife.  Does not routinely exercise.   Social Determinants of Health   Financial Resource Strain:   . Difficulty of Paying Living Expenses:   Food Insecurity:   . Worried About Charity fundraiser in the Last Year:   . Arboriculturist in the Last Year:   Transportation Needs:   . Film/video editor (Medical):   Marland Kitchen Lack of Transportation (Non-Medical):   Physical Activity:   . Days of Exercise per Week:   . Minutes of Exercise per Session:   Stress:   . Feeling of Stress :   Social Connections:   . Frequency of Communication with Friends and Family:   . Frequency of Social Gatherings with Friends and Family:   . Attends Religious Services:   . Active Member of Clubs or Organizations:   . Attends Club or  Organization Meetings:   Marland Kitchen Marital Status:   Intimate Partner Violence:   . Fear of Current or Ex-Partner:   . Emotionally Abused:   Marland Kitchen Physically Abused:   . Sexually Abused:     Family History  Problem Relation Age of Onset  . Heart attack Mother   . Depression Mother   . Heart disease Mother   . COPD Mother   . Hypertension Mother   . Heart attack Father   . Diabetes Father   . Depression Father   . Heart disease Father   . Cirrhosis Father   . Parkinson's disease Brother       Review of systems complete and found to be negative unless listed above      PHYSICAL EXAM  General: Well developed, well nourished, in no acute distress HEENT:  Normocephalic and atramatic Neck:  No JVD.  Lungs: Clear bilaterally to auscultation and percussion. Heart: HRRR . Normal S1 and S2 without gallops or murmurs.  Abdomen: Bowel sounds are positive, abdomen soft and non-tender  Msk:  Back normal, normal gait. Normal strength and tone for age. Extremities: No clubbing, cyanosis or edema.   Neuro: Alert and oriented X 3. Psych:  Good affect, responds  appropriately  Labs:   Lab Results  Component Value Date   WBC 6.2 03/07/2020   HGB 17.7 (H) 03/07/2020   HCT 55.4 (H) 03/07/2020   MCV 93.9 03/07/2020   PLT 150 03/07/2020    Recent Labs  Lab 03/07/20 1227  NA 137  K 4.3  CL 98  CO2 30  BUN 12  CREATININE 1.01  CALCIUM 9.2  GLUCOSE 307*   Lab Results  Component Value Date   CKTOTAL 93 05/23/2014   CKMB 1.7 05/23/2014   TROPONINI <0.03 12/15/2018    Lab Results  Component Value Date   CHOL 110 09/09/2019   CHOL 125 04/19/2018   CHOL 151 01/10/2018   Lab Results  Component Value Date   HDL 31 (L) 09/09/2019   HDL 25 (L) 04/19/2018   HDL 38 (L) 01/10/2018   Lab Results  Component Value Date   LDLCALC 44 09/09/2019   LDLCALC 41 04/19/2018   LDLCALC 56 01/10/2018   Lab Results  Component Value Date   TRIG 175 (H) 09/09/2019   TRIG 297 (H) 04/19/2018   TRIG 286 (H) 01/10/2018   Lab Results  Component Value Date   CHOLHDL 3.5 09/09/2019   CHOLHDL 5.0 04/19/2018   CHOLHDL 4.0 01/10/2018   Lab Results  Component Value Date   LDLDIRECT 58 05/29/2019      Radiology: DG Chest 2 View  Result Date: 03/07/2020 CLINICAL DATA:  Pt presents to ED c/o L-sided chest pain 9/10 starting yesterday. Pt states he took 3 NTG yesterday with relief and 3 today, last one at 1200, with no relief today. Extensive cardiac hx, states last cath last year. former smoker quit in 2014 EXAM: CHEST - 2 VIEW COMPARISON:  10/04/2019 FINDINGS: Stable changes from previous cardiac surgery. The cardiac silhouette is normal in size and configuration. No mediastinal or hilar masses. Linear opacities are noted in the lung bases consistent a combination of prominent bronchovascular markings and scarring or atelectasis. Lungs are hyperexpanded but otherwise clear. No pleural effusion or pneumothorax. Skeletal structures are demineralized but intact. IMPRESSION: 1. No acute cardiopulmonary disease. 2. Hyperexpanded lungs suggesting COPD.  Electronically Signed   By: Lajean Manes M.D.   On: 03/07/2020 13:12    EKG: NS rhythm with  nonspecific T wave abnormalities laterally  ASSESSMENT AND PLAN:   1.  Chest pain, chronic, with multiple hospitalizations for CHF and chest pain, mildly elevated high-sensitivity troponin I 52 and 149, without significant delta, with nondiagnostic ECG. 2.  Coronary artery disease, extensive history of anterior STEMI, CABG x2, atretic LIMA to LAD, status post multiple stents SVG to OM1, with most recent cardiac catheterization 06/12/2019 which revealed atretic LIMA to LAD, patent SVG to OM1, patent stents proximal LAD, and patent stent proximal left circumflex.  I reviewed the previous 4 cardiac catheterizations, most performed via right femoral access, 1 cardiac catheterization performed via left radial access with difficult cannulation of SVG to OM1. 3.  Paroxysmal atrial fibrillation, on Eliquis for stroke prevention, and sotalol for rhythm control.  Patient took Eliquis earlier today.  Recommendations  1.  Agree with overall current therapy 2.  Resume home antianginal medications 3.  Agree with heparin drip for now 4.  Hold Eliquis 5.  Consider cardiac catheterization, the earliest would be 03/11/2020 after holding Eliquis 6.  Further definitive recommendations pending patient's initial clinical course  Signed: Isaias Cowman MD,PhD, [facility] 03/07/2020, 5:12 PM

## 2020-03-07 NOTE — ED Triage Notes (Signed)
Pt presents to ED c/o L-sided chest pain 9/10 starting yesterday. Pt states he took 3 NTG yesterday with relief and 3 today, last one at 1200, with no relief today. Extensive cardiac hx, states last cath last year.

## 2020-03-08 ENCOUNTER — Observation Stay
Admit: 2020-03-08 | Discharge: 2020-03-08 | Disposition: A | Discharge status: Home / Self Care | Payer: Medicare HMO | Attending: Cardiology | Admitting: Cardiology

## 2020-03-08 DIAGNOSIS — I110 Hypertensive heart disease with heart failure: Secondary | ICD-10-CM | POA: Diagnosis present

## 2020-03-08 DIAGNOSIS — I480 Paroxysmal atrial fibrillation: Secondary | ICD-10-CM | POA: Diagnosis present

## 2020-03-08 DIAGNOSIS — I252 Old myocardial infarction: Secondary | ICD-10-CM | POA: Diagnosis not present

## 2020-03-08 DIAGNOSIS — I214 Non-ST elevation (NSTEMI) myocardial infarction: Secondary | ICD-10-CM | POA: Diagnosis present

## 2020-03-08 DIAGNOSIS — Z79899 Other long term (current) drug therapy: Secondary | ICD-10-CM | POA: Diagnosis not present

## 2020-03-08 DIAGNOSIS — G4733 Obstructive sleep apnea (adult) (pediatric): Secondary | ICD-10-CM | POA: Diagnosis present

## 2020-03-08 DIAGNOSIS — J449 Chronic obstructive pulmonary disease, unspecified: Secondary | ICD-10-CM | POA: Diagnosis present

## 2020-03-08 DIAGNOSIS — F419 Anxiety disorder, unspecified: Secondary | ICD-10-CM | POA: Diagnosis present

## 2020-03-08 DIAGNOSIS — Z955 Presence of coronary angioplasty implant and graft: Secondary | ICD-10-CM | POA: Diagnosis not present

## 2020-03-08 DIAGNOSIS — E1121 Type 2 diabetes mellitus with diabetic nephropathy: Secondary | ICD-10-CM | POA: Diagnosis present

## 2020-03-08 DIAGNOSIS — Z7982 Long term (current) use of aspirin: Secondary | ICD-10-CM | POA: Diagnosis not present

## 2020-03-08 DIAGNOSIS — E785 Hyperlipidemia, unspecified: Secondary | ICD-10-CM | POA: Diagnosis present

## 2020-03-08 DIAGNOSIS — I10 Essential (primary) hypertension: Secondary | ICD-10-CM | POA: Diagnosis not present

## 2020-03-08 DIAGNOSIS — Z951 Presence of aortocoronary bypass graft: Secondary | ICD-10-CM | POA: Diagnosis not present

## 2020-03-08 DIAGNOSIS — J9611 Chronic respiratory failure with hypoxia: Secondary | ICD-10-CM | POA: Diagnosis present

## 2020-03-08 DIAGNOSIS — I5032 Chronic diastolic (congestive) heart failure: Secondary | ICD-10-CM | POA: Diagnosis present

## 2020-03-08 DIAGNOSIS — Z20822 Contact with and (suspected) exposure to covid-19: Secondary | ICD-10-CM | POA: Diagnosis present

## 2020-03-08 DIAGNOSIS — Z87891 Personal history of nicotine dependence: Secondary | ICD-10-CM | POA: Diagnosis not present

## 2020-03-08 DIAGNOSIS — R079 Chest pain, unspecified: Secondary | ICD-10-CM | POA: Diagnosis not present

## 2020-03-08 DIAGNOSIS — R778 Other specified abnormalities of plasma proteins: Secondary | ICD-10-CM | POA: Diagnosis present

## 2020-03-08 DIAGNOSIS — K219 Gastro-esophageal reflux disease without esophagitis: Secondary | ICD-10-CM | POA: Diagnosis present

## 2020-03-08 DIAGNOSIS — I25110 Atherosclerotic heart disease of native coronary artery with unstable angina pectoris: Secondary | ICD-10-CM | POA: Diagnosis present

## 2020-03-08 DIAGNOSIS — E1165 Type 2 diabetes mellitus with hyperglycemia: Secondary | ICD-10-CM | POA: Diagnosis present

## 2020-03-08 DIAGNOSIS — Z9981 Dependence on supplemental oxygen: Secondary | ICD-10-CM | POA: Diagnosis not present

## 2020-03-08 DIAGNOSIS — Z7951 Long term (current) use of inhaled steroids: Secondary | ICD-10-CM | POA: Diagnosis not present

## 2020-03-08 DIAGNOSIS — Z79891 Long term (current) use of opiate analgesic: Secondary | ICD-10-CM | POA: Diagnosis not present

## 2020-03-08 DIAGNOSIS — E875 Hyperkalemia: Secondary | ICD-10-CM | POA: Diagnosis not present

## 2020-03-08 LAB — ECHOCARDIOGRAM COMPLETE
Height: 67 in
Weight: 3632 oz

## 2020-03-08 LAB — URINE DRUG SCREEN, QUALITATIVE (ARMC ONLY)
Amphetamines, Ur Screen: NOT DETECTED
Barbiturates, Ur Screen: NOT DETECTED
Benzodiazepine, Ur Scrn: POSITIVE — AB
Cannabinoid 50 Ng, Ur ~~LOC~~: POSITIVE — AB
Cocaine Metabolite,Ur ~~LOC~~: NOT DETECTED
MDMA (Ecstasy)Ur Screen: NOT DETECTED
Methadone Scn, Ur: NOT DETECTED
Opiate, Ur Screen: POSITIVE — AB
Phencyclidine (PCP) Ur S: NOT DETECTED
Tricyclic, Ur Screen: NOT DETECTED

## 2020-03-08 LAB — BASIC METABOLIC PANEL
Anion gap: 9 (ref 5–15)
BUN: 16 mg/dL (ref 8–23)
CO2: 32 mmol/L (ref 22–32)
Calcium: 8.7 mg/dL — ABNORMAL LOW (ref 8.9–10.3)
Chloride: 98 mmol/L (ref 98–111)
Creatinine, Ser: 1.05 mg/dL (ref 0.61–1.24)
GFR calc Af Amer: >60 mL/min (ref >60)
GFR calc non Af Amer: >60 mL/min (ref >60)
Glucose, Bld: 215 mg/dL — ABNORMAL HIGH (ref 70–99)
Potassium: 4.1 mmol/L (ref 3.5–5.1)
Sodium: 139 mmol/L (ref 135–145)

## 2020-03-08 LAB — LIPID PANEL
Cholesterol: 170 mg/dL (ref 0–200)
HDL: 28 mg/dL — ABNORMAL LOW (ref >40)
LDL Cholesterol: UNDETERMINED mg/dL (ref 0–99)
Total CHOL/HDL Ratio: 6.1 RATIO
Triglycerides: 514 mg/dL — ABNORMAL HIGH (ref <150)
VLDL: UNDETERMINED mg/dL (ref 0–40)

## 2020-03-08 LAB — GLUCOSE, CAPILLARY
Glucose-Capillary: 202 mg/dL — ABNORMAL HIGH (ref 70–99)
Glucose-Capillary: 231 mg/dL — ABNORMAL HIGH (ref 70–99)
Glucose-Capillary: 176 mg/dL — ABNORMAL HIGH (ref 70–99)
Glucose-Capillary: 191 mg/dL — ABNORMAL HIGH (ref 70–99)

## 2020-03-08 LAB — C DIFFICILE QUICK SCREEN W PCR REFLEX
C Diff antigen: NEGATIVE
C Diff interpretation: NOT DETECTED
C Diff toxin: NEGATIVE

## 2020-03-08 LAB — HEPARIN LEVEL (UNFRACTIONATED)
Heparin Unfractionated: 0.68 IU/mL (ref 0.30–0.70)
Heparin Unfractionated: 0.53 IU/mL (ref 0.30–0.70)

## 2020-03-08 LAB — CBC
HCT: 51.6 % (ref 39.0–52.0)
Hemoglobin: 16.7 g/dL (ref 13.0–17.0)
MCH: 30.3 pg (ref 26.0–34.0)
MCHC: 32.4 g/dL (ref 30.0–36.0)
MCV: 93.6 fL (ref 80.0–100.0)
Platelets: 143 10*3/uL — ABNORMAL LOW (ref 150–400)
RBC: 5.51 MIL/uL (ref 4.22–5.81)
RDW: 14.9 % (ref 11.5–15.5)
WBC: 7.2 10*3/uL (ref 4.0–10.5)
nRBC: 0 % (ref 0.0–0.2)

## 2020-03-08 LAB — APTT: aPTT: 85 seconds — ABNORMAL HIGH (ref 24–36)

## 2020-03-08 LAB — LDL CHOLESTEROL, DIRECT: Direct LDL: 84.1 mg/dL (ref 0–99)

## 2020-03-08 MED ORDER — INSULIN GLARGINE 100 UNIT/ML ~~LOC~~ SOLN
10.0000 [IU] | Freq: Every day | SUBCUTANEOUS | Status: DC
  Administered 2020-03-08 – 2020-03-11 (×4): 10 [IU] via SUBCUTANEOUS
  Filled 2020-03-08 (×6): qty 0.1

## 2020-03-08 MED ORDER — OXYCODONE-ACETAMINOPHEN 5-325 MG PO TABS
1.0000 | ORAL_TABLET | Freq: Four times a day (QID) | ORAL | Status: DC | PRN
  Administered 2020-03-08 – 2020-03-11 (×8): 1 via ORAL
  Filled 2020-03-08 (×8): qty 1

## 2020-03-08 MED ORDER — OXYCODONE HCL 5 MG PO TABS
5.0000 mg | ORAL_TABLET | Freq: Four times a day (QID) | ORAL | Status: DC | PRN
  Administered 2020-03-08 – 2020-03-11 (×9): 5 mg via ORAL
  Filled 2020-03-08 (×9): qty 1

## 2020-03-08 NOTE — Consult Note (Signed)
[place] for Heparin Indication: chest pain/ACS  Allergies  Allergen Reactions  . Demerol  [Meperidine Hcl]   . Demerol [Meperidine] Hives  . Jardiance [Empagliflozin] Other (See Comments)    Perineal pain  . Prednisone Other (See Comments) and Hypertension    Pt states that this medication puts him in A-fib   . Sulfa Antibiotics Hives  . Albuterol Sulfate [Albuterol] Palpitations and Other (See Comments)    Pt currently uses this medication.    . Morphine Sulfate Nausea And Vomiting, Rash and Other (See Comments)    Pt states that he is only allergic to the tablet form of this medication.      Patient Measurements: Height: 5' 7"  (170.2 cm) Weight: 103 kg (227 lb) IBW/kg (Calculated) : 66.1 Heparin Dosing Weight: 88.7 kg  Vital Signs: Temp: 97.4 F (36.3 C) (04/23 0414) Temp Source: Oral (04/23 0414) BP: 103/68 (04/23 0414) Pulse Rate: 54 (04/23 0414)  Labs: Recent Labs    03/07/20 1227 03/07/20 1227 03/07/20 1408 03/07/20 1408 03/07/20 1807 03/07/20 2019 03/07/20 2223 03/07/20 2257 03/08/20 0543  HGB 17.7*  --   --   --   --   --   --   --  16.7  HCT 55.4*  --   --   --   --   --   --   --  51.6  PLT 150  --   --   --   --   --   --   --  143*  APTT  --   --  32  --   --   --  102*  --  85*  LABPROT  --   --  13.1  --   --   --   --   --   --   INR  --   --  1.0  --   --   --   --   --   --   HEPARINUNFRC  --   --  1.08*  --   --   --   --   --  0.68  CREATININE 1.01  --   --   --   --   --   --   --  1.05  TROPONINIHS 152*   < > 149*   < > 145* 133*  --  117*  --    < > = values in this interval not displayed.    Estimated Creatinine Clearance: 79.2 mL/min (by C-G formula based on SCr of 1.05 mg/dL).   Medical History: Past Medical History:  Diagnosis Date  . A-fib ([place])   . Anemia   . Anginal pain ([place])   . Anxiety   . Arthritis   . Asthma   . CAD (coronary artery disease)    a. 2002 CABGx2 (LIMA->LAD,  VG->VG->OM1);  b. 09/2012 DES->OM;  c. 03/2015 PTCA of LAD [facility]) in setting of atretic LIMA; d. 05/2015 Cath [facility]): nonobs dzs; e. 06/2015 Cath ([facility]): LM nl, LAD 45p/d ISR, 50d, D1/2 small, LCX 50p/d ISR, OM1 70ost, 30 ISR, VG->OM1 50ost, 49m LIMA->LAD 99p/d - atretic, RCA dom, nl; f.cath 10/16: 40-50%(FFR 0.90) pLAD, 75% (FFR 0.77) mLAD s/p PCI/DES, oRCA 40% (FFR0.95)  . Cancer ([place]    SKIN CANCER ON BACK  . Celiac disease   . Chronic diastolic CHF (congestive heart failure) ([place]    a. 06/2009 Echo: EF 60-65%, Gr 1 DD, triv AI, mildly dil LA,  nl RV.  Marland Kitchen COPD (chronic obstructive pulmonary disease) ([place])    a. Chronic bronchitis and emphysema.  . DDD (degenerative disc disease), lumbar   . Diverticulosis   . Dysrhythmia   . Essential hypertension   . GERD (gastroesophageal reflux disease)   . History of hiatal hernia   . History of kidney stones    H/O  . History of tobacco abuse    a. Quit 2014.  Marland Kitchen Myocardial infarction ([place]) 2002   4 STENTS  . Pancreatitis   . PSVT (paroxysmal supraventricular tachycardia) ([place])    a. 10/2012 Noted on Zio Patch.  . Sleep apnea    LOST CORD TO CPAP -ONLY 02 @ BEDTIME  . Tubular adenoma of colon   . Type II diabetes mellitus ([facility])     Medications:  Medications Prior to Admission  Medication Sig Dispense Refill Last Dose  . Albuterol Sulfate, sensor, 108 (90 Base) MCG/ACT AEPB Inhale 2 puffs into the lungs at bedtime. AND PRN   PRN  . allopurinol (ZYLOPRIM) 300 MG tablet TAKE 1 TABLET BY MOUTH TWICE A DAY 180 tablet 2 03/07/2020 at 1100  . ALPRAZolam (XANAX) 1 MG tablet Take 1 tablet (1 mg total) by mouth 3 (three) times daily. 90 tablet 4 03/07/2020 at 1100  . aspirin 81 MG chewable tablet Chew 81 mg by mouth daily.   03/07/2020 at 1100  . atorvastatin (LIPITOR) 80 MG tablet TAKE ONE TABLET BY MOUTH AT BEDTIME 90 tablet 1 03/06/2020 at Unknown time  . BREO ELLIPTA 100-25 MCG/INH AEPB INHALE 1 PUFF INTO THE LUNGS DAILY 60 each 5 03/07/2020 at 1100  .  budesonide (PULMICORT) 0.5 MG/2ML nebulizer solution Take 0.5 mg by nebulization 2 (two) times daily.   PRN at PRN  . cetirizine (ZYRTEC) 10 MG tablet TAKE ONE TABLET BY MOUTH AT BEDTIME 30 tablet 6 03/06/2020 at Unknown time  . Cholecalciferol (VITAMIN D3) 125 MCG (5000 UT) CAPS Take 5,000 Units by mouth daily.   03/07/2020 at Unknown time  . dapagliflozin propanediol (FARXIGA) 10 MG TABS tablet Take 10 mg by mouth daily.   03/07/2020 at 11:00  . docusate sodium (COLACE) 100 MG capsule Take 100 mg by mouth daily.     at 11:00  . ELIQUIS 5 MG TABS tablet TAKE 1 TABLET BY MOUTH TWICE A DAY 180 tablet 0 03/07/2020 at 1100  . isosorbide mononitrate (IMDUR) 120 MG 24 hr tablet Take 1 tablet (120 mg total) by mouth daily. 30 tablet 5 03/07/2020 at 0900  . LINZESS 72 MCG capsule Take 72 mcg by mouth daily before breakfast.    03/07/2020 at 1100  . lisinopril (ZESTRIL) 10 MG tablet Take 10 mg by mouth daily.   03/07/2020 at 1100  . magnesium oxide (MAG-OX) 400 (241.3 Mg) MG tablet Take 1 tablet (400 mg total) by mouth every morning. 30 tablet 5 03/07/2020 at 1100  . methocarbamol (ROBAXIN) 500 MG tablet TAKE 1 TABLET BY MOUTH 4 TIMES DAILY 60 tablet 3 03/07/2020 at 1100  . nitroGLYCERIN (NITROSTAT) 0.4 MG SL tablet Place 1 tablet (0.4 mg total) under the tongue every 5 (five) minutes as needed for chest pain. Reported on 05/26/2016 30 tablet 3 PRN at PRN  . omega-3 acid ethyl esters (LOVAZA) 1 g capsule Take 4 g by mouth daily.   03/07/2020 at 1100  . oxyCODONE-acetaminophen (PERCOCET) 10-325 MG tablet TAKE 1 TABLET BY MOUTH EVERY 4 HOURS 150 tablet 0 03/07/2020 at 1000  . ranolazine (RANEXA) 1000 MG SR  tablet Take 1,000 mg by mouth 2 (two) times daily.   03/07/2020 at Unknown time  . sotalol (BETAPACE) 80 MG tablet Take 80 mg by mouth 2 (two) times daily.   03/07/2020 at 1100  . tiotropium (SPIRIVA) 18 MCG inhalation capsule Place 18 mcg into inhaler and inhale daily.   03/07/2020 at 1100  . torsemide (DEMADEX) 100 MG  tablet Take 50 mg by mouth daily.    PRN at PRN  . traZODone (DESYREL) 100 MG tablet Take 100 mg by mouth at bedtime.   03/06/2020 at 2130  . venlafaxine XR (EFFEXOR-XR) 75 MG 24 hr capsule Take 75 mg by mouth daily with breakfast.   Not Taking at Unknown time   Scheduled:  . allopurinol  300 mg Oral BID  . ALPRAZolam  1 mg Oral TID  . aspirin  81 mg Oral Daily  . atorvastatin  80 mg Oral QHS  . budesonide  0.5 mg Nebulization BID  . cholecalciferol  5,000 Units Oral Daily  . docusate sodium  100 mg Oral Daily  . fluticasone furoate-vilanterol  1 puff Inhalation Daily  . insulin aspart  0-5 Units Subcutaneous QHS  . insulin aspart  0-9 Units Subcutaneous TID WC  . isosorbide mononitrate  120 mg Oral Daily  . linaclotide  72 mcg Oral QAC breakfast  . lisinopril  10 mg Oral Daily  . loratadine  10 mg Oral Daily  . magnesium oxide  400 mg Oral [id number]  . methocarbamol  500 mg Oral QID  . omega-3 acid ethyl esters  4 g Oral Daily  . ranolazine  1,000 mg Oral BID  . sotalol  80 mg Oral Q12H  . tiotropium  18 mcg Inhalation Daily  . traZODone  100 mg Oral QHS   Infusions:  . heparin 1,000 Units/hr (03/08/20 0030)   PRN:  Anti-infectives (From admission, onward)   None      Assessment: Pharmacy consulted to start heparin for NSTEMI. Pt is on apixaban PTA for afib. hgb and plt stable. Will order baseline aPTT, HL, and INR. Will monitor with aPTT levels until heparin level and aPTT correlate.   04/23 @0543  HL 0.68. Therapeutic. 04/23 @0543  aPTT 85.  Therapeutic.   Goal of Therapy:  APTT 66-102 seconds Heparin level 0.3-0.7 units/ml, once heparin and aPTT levels correlate.  Monitor platelets by anticoagulation protocol: Yes   Plan:  1. HL and aPTT levels correlate; therefore, will start using HL.   2. Given this level is followed by a rate change will consider therapeutic x1. Will continue the current heparin rate of 1000 units/hr. Will recheck HL in 6 hours and CBC with AM  labs.    Rowland Lathe, PharmD 03/08/2020,7:23 AM

## 2020-03-08 NOTE — Consult Note (Signed)
[facility] for Heparin Indication: chest pain/ACS  Allergies  Allergen Reactions  . Demerol  [Meperidine Hcl]   . Demerol [Meperidine] Hives  . Jardiance [Empagliflozin] Other (See Comments)    Perineal pain  . Prednisone Other (See Comments) and Hypertension    Pt states that this medication puts him in A-fib   . Sulfa Antibiotics Hives  . Albuterol Sulfate [Albuterol] Palpitations and Other (See Comments)    Pt currently uses this medication.    . Morphine Sulfate Nausea And Vomiting, Rash and Other (See Comments)    Pt states that he is only allergic to the tablet form of this medication.      Patient Measurements: Height: 5' 7"  (170.2 cm) Weight: 103 kg (227 lb) IBW/kg (Calculated) : 66.1 Heparin Dosing Weight: 88.7 kg  Vital Signs: Temp: 97.5 F (36.4 C) (04/22 1224) Temp Source: Oral (04/22 1224) BP: 136/91 (04/22 2223) Pulse Rate: 51 (04/22 2223)  Labs: Recent Labs    03/07/20 1227 03/07/20 1227 03/07/20 1408 03/07/20 1408 03/07/20 1807 03/07/20 2019 03/07/20 2223 03/07/20 2257  HGB 17.7*  --   --   --   --   --   --   --   HCT 55.4*  --   --   --   --   --   --   --   PLT 150  --   --   --   --   --   --   --   APTT  --   --  32  --   --   --  102*  --   LABPROT  --   --  13.1  --   --   --   --   --   INR  --   --  1.0  --   --   --   --   --   HEPARINUNFRC  --   --  1.08*  --   --   --   --   --   CREATININE 1.01  --   --   --   --   --   --   --   TROPONINIHS 152*   < > 149*   < > 145* 133*  --  117*   < > = values in this interval not displayed.    Estimated Creatinine Clearance: 82.3 mL/min (by C-G formula based on SCr of 1.01 mg/dL).   Medical History: Past Medical History:  Diagnosis Date  . A-fib ([place])   . Anemia   . Anginal pain ([place])   . Anxiety   . Arthritis   . Asthma   . CAD (coronary artery disease)    a. 2002 CABGx2 (LIMA->LAD, VG->VG->OM1);  b. 09/2012 DES->OM;  c. 03/2015 PTCA of LAD  [facility]) in setting of atretic LIMA; d. 05/2015 Cath [facility]): nonobs dzs; e. 06/2015 Cath ([facility]): LM nl, LAD 45p/d ISR, 50d, D1/2 small, LCX 50p/d ISR, OM1 70ost, 30 ISR, VG->OM1 50ost, 84m LIMA->LAD 99p/d - atretic, RCA dom, nl; f.cath 10/16: 40-50%(FFR 0.90) pLAD, 75% (FFR 0.77) mLAD s/p PCI/DES, oRCA 40% (FFR0.95)  . Cancer ([place]    SKIN CANCER ON BACK  . Celiac disease   . Chronic diastolic CHF (congestive heart failure) ([place]    a. 06/2009 Echo: EF 60-65%, Gr 1 DD, triv AI, mildly dil LA, nl RV.  .Marland KitchenCOPD (chronic obstructive pulmonary disease) ([place]    a. Chronic bronchitis and  emphysema.  . DDD (degenerative disc disease), lumbar   . Diverticulosis   . Dysrhythmia   . Essential hypertension   . GERD (gastroesophageal reflux disease)   . History of hiatal hernia   . History of kidney stones    H/O  . History of tobacco abuse    a. Quit 2014.  Marland Kitchen Myocardial infarction ([place]) 2002   4 STENTS  . Pancreatitis   . PSVT (paroxysmal supraventricular tachycardia) ([place])    a. 10/2012 Noted on Zio Patch.  . Sleep apnea    LOST CORD TO CPAP -ONLY 02 @ BEDTIME  . Tubular adenoma of colon   . Type II diabetes mellitus (HCC)     Medications:  Medications Prior to Admission  Medication Sig Dispense Refill Last Dose  . Albuterol Sulfate, sensor, 108 (90 Base) MCG/ACT AEPB Inhale 2 puffs into the lungs at bedtime. AND PRN   PRN  . allopurinol (ZYLOPRIM) 300 MG tablet TAKE 1 TABLET BY MOUTH TWICE A DAY 180 tablet 2 03/07/2020 at 1100  . ALPRAZolam (XANAX) 1 MG tablet Take 1 tablet (1 mg total) by mouth 3 (three) times daily. 90 tablet 4 03/07/2020 at 1100  . aspirin 81 MG chewable tablet Chew 81 mg by mouth daily.   03/07/2020 at 1100  . atorvastatin (LIPITOR) 80 MG tablet TAKE ONE TABLET BY MOUTH AT BEDTIME 90 tablet 1 03/06/2020 at Unknown time  . BREO ELLIPTA 100-25 MCG/INH AEPB INHALE 1 PUFF INTO THE LUNGS DAILY 60 each 5 03/07/2020 at 1100  . budesonide (PULMICORT) 0.5 MG/2ML nebulizer solution Take  0.5 mg by nebulization 2 (two) times daily.   PRN at PRN  . cetirizine (ZYRTEC) 10 MG tablet TAKE ONE TABLET BY MOUTH AT BEDTIME 30 tablet 6 03/06/2020 at Unknown time  . Cholecalciferol (VITAMIN D3) 125 MCG (5000 UT) CAPS Take 5,000 Units by mouth daily.   03/07/2020 at Unknown time  . dapagliflozin propanediol (FARXIGA) 10 MG TABS tablet Take 10 mg by mouth daily.   03/07/2020 at 11:00  . docusate sodium (COLACE) 100 MG capsule Take 100 mg by mouth daily.     at 11:00  . ELIQUIS 5 MG TABS tablet TAKE 1 TABLET BY MOUTH TWICE A DAY 180 tablet 0 03/07/2020 at 1100  . isosorbide mononitrate (IMDUR) 120 MG 24 hr tablet Take 1 tablet (120 mg total) by mouth daily. 30 tablet 5 03/07/2020 at 0900  . LINZESS 72 MCG capsule Take 72 mcg by mouth daily before breakfast.    03/07/2020 at 1100  . lisinopril (ZESTRIL) 10 MG tablet Take 10 mg by mouth daily.   03/07/2020 at 1100  . magnesium oxide (MAG-OX) 400 (241.3 Mg) MG tablet Take 1 tablet (400 mg total) by mouth every morning. 30 tablet 5 03/07/2020 at 1100  . methocarbamol (ROBAXIN) 500 MG tablet TAKE 1 TABLET BY MOUTH 4 TIMES DAILY 60 tablet 3 03/07/2020 at 1100  . nitroGLYCERIN (NITROSTAT) 0.4 MG SL tablet Place 1 tablet (0.4 mg total) under the tongue every 5 (five) minutes as needed for chest pain. Reported on 05/26/2016 30 tablet 3 PRN at PRN  . omega-3 acid ethyl esters (LOVAZA) 1 g capsule Take 4 g by mouth daily.   03/07/2020 at 1100  . oxyCODONE-acetaminophen (PERCOCET) 10-325 MG tablet TAKE 1 TABLET BY MOUTH EVERY 4 HOURS 150 tablet 0 03/07/2020 at 1000  . ranolazine (RANEXA) 1000 MG SR tablet Take 1,000 mg by mouth 2 (two) times daily.   03/07/2020 at Unknown time  .  sotalol (BETAPACE) 80 MG tablet Take 80 mg by mouth 2 (two) times daily.   03/07/2020 at 1100  . tiotropium (SPIRIVA) 18 MCG inhalation capsule Place 18 mcg into inhaler and inhale daily.   03/07/2020 at 1100  . torsemide (DEMADEX) 100 MG tablet Take 50 mg by mouth daily.    PRN at PRN  .  traZODone (DESYREL) 100 MG tablet Take 100 mg by mouth at bedtime.   03/06/2020 at 2130  . venlafaxine XR (EFFEXOR-XR) 75 MG 24 hr capsule Take 75 mg by mouth daily with breakfast.   Not Taking at Unknown time   Scheduled:  . allopurinol  300 mg Oral BID  . ALPRAZolam  1 mg Oral TID  . aspirin  81 mg Oral Daily  . atorvastatin  80 mg Oral QHS  . budesonide  0.5 mg Nebulization BID  . cholecalciferol  5,000 Units Oral Daily  . docusate sodium  100 mg Oral Daily  . fluticasone furoate-vilanterol  1 puff Inhalation Daily  . insulin aspart  0-5 Units Subcutaneous QHS  . insulin aspart  0-9 Units Subcutaneous TID WC  . isosorbide mononitrate  120 mg Oral Daily  . linaclotide  72 mcg Oral QAC breakfast  . lisinopril  10 mg Oral Daily  . loratadine  10 mg Oral Daily  . magnesium oxide  400 mg Oral BH-q7a  . methocarbamol  500 mg Oral QID  . omega-3 acid ethyl esters  4 g Oral Daily  . ranolazine  1,000 mg Oral BID  . sotalol  80 mg Oral Q12H  . tiotropium  18 mcg Inhalation Daily  . traZODone  100 mg Oral QHS   Infusions:  . heparin 1,200 Units/hr (03/07/20 1506)   PRN:  Anti-infectives (From admission, onward)   None      Assessment: Pharmacy consulted to start heparin for NSTEMI. Pt is on apixaban PTA for afib. hgb and plt stable. Will order baseline aPTT, HL, and INR. Will monitor with aPTT levels until heparin level and aPTT correlate.   Goal of Therapy:  APTT 66-102 seconds Heparin level 0.3-0.7 units/ml, once heparin and aPTT levels correlate.  Monitor platelets by anticoagulation protocol: Yes   Plan:  04/22 @ 2200 aPTT 102 seconds therapeutic right at upper end of range. Will decrease rate to 1000 units/hr as I suspect he might become supratherapeutic. Will recheck aPTT at 0600 and will continue to monitor.  Tobie Lords, PharmD, BCPS 03/08/2020,12:08 AM

## 2020-03-08 NOTE — Progress Notes (Signed)
PROGRESS NOTE    Lawrence [name]  QMV:[id number] DOB: Oct 04, 1956 DOA: 03/07/2020 PCP: Birdie Sons, MD (Confirm with patient/family/NH records and if not entered, this HAS to be entered at [facility] point of entry. "No PCP" if truly none.)   Brief Narrative: (Start on day 1 of progress note - keep it brief and live) 64 year old male with history of essential hypertension, dyslipidemia, type 2 diabetes, COPD, asthma, off of track sleep apnea, history of pancreatitis, chronic diastolic congestive heart failure, coronary disease with history of CABG, atrial fibrillation on Eliquis.  Patient came to the hospital complaint chest pain started 2 days ago.  He had a peak troponin was 152, dropped down to 117.   Assessment & Plan:   Principal Problem:   Chest pain Active Problems:   HTN (hypertension)   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HCC)   HLD (hyperlipidemia)   COPD (chronic obstructive pulmonary disease) (HCC)   Chronic diastolic CHF (congestive heart failure) (HCC)   Anxiety   Paroxysmal atrial fibrillation (HCC)   Elevated troponin  #1.  Non-STEMI. Patient has a very complex cardiac history.  He has been complaining of chest pain for 2 days.  Peak troponin was 152.  He continued had chest pain through the night, however, troponin dropped down to 117.  Discussed with cardiology.  Currently on heparin drip, will be seen by cardiology again.  Heart cath planned on Monday.  #2.  Paroxysmal atrial fibrillation. Eliquis discontinued, currently heparin pending heart cath.  3.  Type 2 diabetes uncontrolled with hyperglycemia. Glucose running high, will start low-dose Lantus.  Continue sliding scale insulin.  #4.  Essential hypertension.   Continue home medicines.  5.  COPD. No exacerbation.     DVT prophylaxis: On full anticoagulation. Code Status: Full Family Communication: Plan discussed with patient. Disposition Plan:  . Patient came from:            . Anticipated  d/c place: . Barriers to d/c OR conditions which need to be met to effect a safe d/c:   Consultants:   Cardiology  Procedures: None Antimicrobials: None  Subjective: Patient still has significant chest pain.  He described as a 7/10, localized to mid chest, radiates to the left hand.  He has baseline shortness of breath.  No wheezing. Denies any nausea vomiting or diaphoresis.  Objective: Vitals:   03/07/20 2139 03/07/20 2223 03/07/20 2342 03/08/20 0414  BP: 131/85 (!) 136/91  103/68  Pulse: (!) 52 (!) 51  (!) 54  Resp: 18 19  20   Temp:    (!) 97.4 F (36.3 C)  TempSrc:    Oral  SpO2: 96% 95% 98% 96%  Weight:      Height:        Intake/Output Summary (Last 24 hours) at 03/08/2020 0730 Last data filed at 03/07/2020 2138 Gross per 24 hour  Intake --  Output 700 ml  Net -700 ml   Filed Weights   03/07/20 1224  Weight: 103 kg    Examination:  General exam: Appears calm and comfortable  Respiratory system: Clear to auscultation. Respiratory effort normal. Cardiovascular system: S1 & S2 heard, RRR. No JVD, murmurs, rubs, gallops or clicks. No pedal edema. Gastrointestinal system: Abdomen is nondistended, soft and nontender. No organomegaly or masses felt. Normal bowel sounds heard. Central nervous system: Alert and oriented. No focal neurological deficits. Extremities: Symmetric 5 x 5 power. Skin: No rashes, lesions or ulcers Psychiatry: Judgement and insight appear normal. Mood & affect appropriate.  Data Reviewed: I have personally reviewed following labs and imaging studies  CBC: Recent Labs  Lab 03/07/20 1227 03/08/20 0543  WBC 6.2 7.2  HGB 17.7* 16.7  HCT 55.4* 51.6  MCV 93.9 93.6  PLT 150 638*   Basic Metabolic Panel: Recent Labs  Lab 03/07/20 1227 03/07/20 1807 03/08/20 0543  NA 137  --  139  K 4.3  --  4.1  CL 98  --  98  CO2 30  --  32  GLUCOSE 307*  --  215*  BUN 12  --  16  CREATININE 1.01  --  1.05  CALCIUM 9.2  --  8.7*  MG  --   2.2  --    GFR: Estimated Creatinine Clearance: 79.2 mL/min (by C-G formula based on SCr of 1.05 mg/dL). Liver Function Tests: No results for input(s): AST, ALT, ALKPHOS, BILITOT, PROT, ALBUMIN in the last 168 hours. No results for input(s): LIPASE, AMYLASE in the last 168 hours. No results for input(s): AMMONIA in the last 168 hours. Coagulation Profile: Recent Labs  Lab 03/07/20 1408  INR 1.0   Cardiac Enzymes: No results for input(s): CKTOTAL, CKMB, CKMBINDEX, TROPONINI in the last 168 hours. BNP (last 3 results) No results for input(s): PROBNP in the last 8760 hours. HbA1C: Recent Labs    03/07/20 1644  HGBA1C 7.9*   CBG: Recent Labs  Lab 03/07/20 1643 03/07/20 2137  GLUCAP 198* 175*   Lipid Profile: Recent Labs    03/08/20 0543  CHOL 170  HDL 28*  LDLCALC UNABLE TO CALCULATE IF TRIGLYCERIDE OVER 400 mg/dL  TRIG 514*  CHOLHDL 6.1   Thyroid Function Tests: No results for input(s): TSH, T4TOTAL, FREET4, T3FREE, THYROIDAB in the last 72 hours. Anemia Panel: No results for input(s): VITAMINB12, FOLATE, FERRITIN, TIBC, IRON, RETICCTPCT in the last 72 hours. Sepsis Labs: No results for input(s): PROCALCITON, LATICACIDVEN in the last 168 hours.  Recent Results (from the past 240 hour(s))  Respiratory Panel by RT PCR (Flu A&B, Covid) - Nasopharyngeal Swab     Status: None   Collection Time: 03/07/20  4:44 PM   Specimen: Nasopharyngeal Swab  Result Value Ref Range Status   SARS Coronavirus 2 by RT PCR NEGATIVE NEGATIVE Final    Comment: (NOTE) SARS-CoV-2 target nucleic acids are NOT DETECTED. The SARS-CoV-2 RNA is generally detectable in upper respiratoy specimens during the acute phase of infection. The lowest concentration of SARS-CoV-2 viral copies this assay can detect is 131 copies/mL. A negative result does not preclude SARS-Cov-2 infection and should not be used as the sole basis for treatment or other patient management decisions. A negative result may  occur with  improper specimen collection/handling, submission of specimen other than nasopharyngeal swab, presence of viral mutation(s) within the areas targeted by this assay, and inadequate number of viral copies (<131 copies/mL). A negative result must be combined with clinical observations, patient history, and epidemiological information. The expected result is Negative. Fact Sheet for Patients:  PinkCheek.be Fact Sheet for Healthcare Providers:  GravelBags.it This test is not yet ap proved or cleared by the [place] FDA and  has been authorized for detection and/or diagnosis of SARS-CoV-2 by FDA under an Emergency Use Authorization (EUA). This EUA will remain  in effect (meaning this test can be used) for the duration of the COVID-19 declaration under Section 564(b)(1) of the Act, 21 U.S.C. section 360bbb-3(b)(1), unless the authorization is terminated or revoked sooner.    Influenza A by PCR NEGATIVE NEGATIVE Final  Influenza B by PCR NEGATIVE NEGATIVE Final    Comment: (NOTE) The Xpert Xpress SARS-CoV-2/FLU/RSV assay is intended as an aid in  the diagnosis of influenza from Nasopharyngeal swab specimens and  should not be used as a sole basis for treatment. Nasal washings and  aspirates are unacceptable for Xpert Xpress SARS-CoV-2/FLU/RSV  testing. Fact Sheet for Patients: PinkCheek.be Fact Sheet for Healthcare Providers: GravelBags.it This test is not yet approved or cleared by the [place] FDA and  has been authorized for detection and/or diagnosis of SARS-CoV-2 by  FDA under an Emergency Use Authorization (EUA). This EUA will remain  in effect (meaning this test can be used) for the duration of the  Covid-19 declaration under Section 564(b)(1) of the Act, 21  U.S.C. section 360bbb-3(b)(1), unless the authorization is  terminated or  revoked. Performed at [facility], [address] [place], [place] [address]          Radiology Studies: DG Chest 2 View  Result Date: 03/07/2020 CLINICAL DATA:  Pt presents to ED c/o L-sided chest pain 9/10 starting yesterday. Pt states he took 3 NTG yesterday with relief and 3 today, last one at 1200, with no relief today. Extensive cardiac hx, states last cath last year. former smoker quit in 2014 EXAM: CHEST - 2 VIEW COMPARISON:  10/04/2019 FINDINGS: Stable changes from previous cardiac surgery. The cardiac silhouette is normal in size and configuration. No mediastinal or hilar masses. Linear opacities are noted in the lung bases consistent a combination of prominent bronchovascular markings and scarring or atelectasis. Lungs are hyperexpanded but otherwise clear. No pleural effusion or pneumothorax. Skeletal structures are demineralized but intact. IMPRESSION: 1. No acute cardiopulmonary disease. 2. Hyperexpanded lungs suggesting COPD. Electronically Signed   By: Lajean Manes M.D.   On: 03/07/2020 13:12        Scheduled Meds: . allopurinol  300 mg Oral BID  . ALPRAZolam  1 mg Oral TID  . aspirin  81 mg Oral Daily  . atorvastatin  80 mg Oral QHS  . budesonide  0.5 mg Nebulization BID  . cholecalciferol  5,000 Units Oral Daily  . docusate sodium  100 mg Oral Daily  . fluticasone furoate-vilanterol  1 puff Inhalation Daily  . insulin aspart  0-5 Units Subcutaneous QHS  . insulin aspart  0-9 Units Subcutaneous TID WC  . insulin glargine  10 Units Subcutaneous Daily  . isosorbide mononitrate  120 mg Oral Daily  . linaclotide  72 mcg Oral QAC breakfast  . lisinopril  10 mg Oral Daily  . loratadine  10 mg Oral Daily  . magnesium oxide  400 mg Oral BH-q7a  . methocarbamol  500 mg Oral QID  . omega-3 acid ethyl esters  4 g Oral Daily  . ranolazine  1,000 mg Oral BID  . sotalol  80 mg Oral Q12H  . tiotropium  18 mcg Inhalation Daily  . traZODone  100 mg Oral QHS    Continuous Infusions: . heparin 1,000 Units/hr (03/08/20 0030)     LOS: 0 days    Time spent: 21 mins    Sharen Hones, MD [facility]   To contact the attending provider between 7A-7P or the covering provider during after hours 7P-7A, please log into the web site [email or web address] and access using universal [place] password for that web site. If you do not have the password, please call the hospital operator.  03/08/2020, 7:30 AM

## 2020-03-08 NOTE — Plan of Care (Signed)
  Problem: Education: Goal: Understanding of cardiac disease, CV risk reduction, and recovery process will improve Outcome: Progressing Goal: Understanding of medication regimen will improve Outcome: Progressing Goal: Individualized Educational Video(s) Outcome: Progressing   Problem: Activity: Goal: Ability to tolerate increased activity will improve Outcome: Progressing   Problem: Cardiac: Goal: Ability to achieve and maintain adequate cardiopulmonary perfusion will improve Outcome: Progressing Goal: Vascular access site(s) Level 0-1 will be maintained Outcome: Progressing   Problem: Health Behavior/Discharge Planning: Goal: Ability to safely manage health-related needs after discharge will improve Outcome: Progressing   

## 2020-03-08 NOTE — TOC Initial Note (Signed)
Transition of Care [facility]) - Initial/Assessment Note    Patient Details  Name: Lawrence [name] MRN: [id number] Date of Birth: Jan 27, 1956  Transition of Care [facility]) CM/SW Contact:    Magnus Ivan, LCSW Phone Number: 03/08/2020, 1:42 PM  Clinical Narrative:                CSW met with patient at bedside due to patient reporting his CPAP is broken. Patient reported his CPAP was provided through [place] and he thinks he has had it about 5 years. Patient gave CSW permission to contact [facility] on his behalf regarding the broken CPAP.  CSW called [facility] and spoke with [employer]. Per Cecille Rubin, patient got his CPAP in January 2017 so insurance will cover a new one January 2022. Cecille Rubin submitted a ticket due to patient reporting his current CPAP is broken. Cecille Rubin reported someone will reach out to patient about getting this resolved.  CSW also asked patient about his home set up. Patient reported he lives alone and drives himself to appointments. Patient has a CPAP and oxygen at home. Patient denied any HH or SNF history. Patient also denied additional needs at this time.    Expected Discharge Plan: Home/Self Care Barriers to Discharge: Continued Medical Work up   Patient Goals and CMS Choice Patient states their goals for this hospitalization and ongoing recovery are:: to return home when able      Expected Discharge Plan and Services Expected Discharge Plan: Home/Self Care       Living arrangements for the past 2 months: [place]                   DME Agency: [place] Date DME Agency Contacted: 03/08/20   Representative spoke with at DME Agency: Cecille Rubin            Prior Living Arrangements/Services Living arrangements for the past 2 months: [place] Lives with:: Self Patient language and need for interpreter reviewed:: Yes Do you feel safe going back to the place where you live?: Yes          Current home services: DME Criminal Activity/Legal  Involvement Pertinent to Current Situation/Hospitalization: No - Comment as needed  Activities of Daily Living Home Assistive Devices/Equipment: None ADL Screening (condition at time of admission) Patient's cognitive ability adequate to safely complete daily activities?: Yes Is the patient deaf or have difficulty hearing?: No Does the patient have difficulty seeing, even when wearing glasses/contacts?: Yes Does the patient have difficulty concentrating, remembering, or making decisions?: No Patient able to express need for assistance with ADLs?: Yes Does the patient have difficulty dressing or bathing?: No Independently performs ADLs?: Yes (appropriate for developmental age) Does the patient have difficulty walking or climbing stairs?: No Weakness of Legs: None Weakness of Arms/Hands: None  Permission Sought/Granted Permission sought to share information with : Investment banker, corporate granted to share info w AGENCY: [facility]        Emotional Assessment Appearance:: Appears stated age Attitude/Demeanor/Rapport: Engaged Affect (typically observed): Calm, Appropriate Orientation: : Oriented to Self, Oriented to Place, Oriented to  Time, Oriented to Situation Alcohol / Substance Use: Not Applicable Psych Involvement: No (comment)  Admission diagnosis:  NSTEMI (non-ST elevated myocardial infarction) ([place]) [I21.4] Chest pain [R07.9] Chest pain in adult [R07.9] Patient Active Problem List   Diagnosis Date Noted  . Elevated troponin 03/07/2020  . Bereavement 09/09/2019  . Chronic respiratory failure with hypoxia ([place]) 05/29/2019  .  Morbid obesity ([place]) 05/29/2019  . Trigger thumb of right hand 11/28/2018  . Eye pain, left 08/18/2018  . Acute on chronic heart failure ([place]) 04/18/2018  . History of adenomatous polyp of colon 04/05/2018  . Abdominal pain, chronic, epigastric 11/06/2017  . Bilateral flank pain 03/24/2017  . Dyspnea 04/03/2016  . Hypotension  04/03/2016  . CKD (chronic kidney disease) stage 2, GFR 60-89 ml/min 04/03/2016  . Anemia 04/03/2016  . Paroxysmal atrial fibrillation ([place]) 12/23/2015  . OSA (obstructive sleep apnea) 12/10/2015  . Left inguinal hernia 11/07/2015  . Anxiety 11/07/2015  . Unstable angina ([place]) 10/05/2015  . Back pain with left-sided radiculopathy 09/30/2015  . Nocturnal hypoxia 09/06/2015  . BPH (benign prostatic hyperplasia) 08/01/2015  . Chronic diastolic CHF (congestive heart failure) ([place])   . Angina pectoris ([place])   . Chest pain 07/11/2015  . COPD (chronic obstructive pulmonary disease) ([place]) 07/03/2015  . CAD (coronary artery disease) 06/26/2015  . HTN (hypertension) 06/26/2015  . 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[place]) 06/26/2015  . Achalasia 07/24/2014  . GERD (gastroesophageal reflux disease) 06/07/2014  . Former tobacco use 04/11/2013  . HLD (hyperlipidemia) 04/09/2013   PCP:  Birdie Sons, MD Pharmacy:   [facility] [place], [place] - [address] [address] [place] [address] Phone: [phone] Fax: [phone]  [facility] - [place], [place] [address] Phone: [phone] Fax: [phone]     Social Determinants of Health (SDOH) Interventions    Readmission Risk Interventions No flowsheet data found.

## 2020-03-08 NOTE — Progress Notes (Signed)
*  PRELIMINARY RESULTS* Echocardiogram 2D Echocardiogram has been performed.  Lawrence [name] 03/08/2020, 9:51 AM

## 2020-03-08 NOTE — Progress Notes (Signed)
[facility] Cardiology  SUBJECTIVE: Patient laying in bed, reports chest pain which is more constant in nature, and states "the only thing that helps is Dilaudid"   Vitals:   03/07/20 2139 03/07/20 2223 03/07/20 2342 03/08/20 0414  BP: 131/85 (!) 136/91  103/68  Pulse: (!) 52 (!) 51  (!) 54  Resp: 18 19  20   Temp:    (!) 97.4 F (36.3 C)  TempSrc:    Oral  SpO2: 96% 95% 98% 96%  Weight:      Height:         Intake/Output Summary (Last 24 hours) at 03/08/2020 0758 Last data filed at 03/07/2020 2138 Gross per 24 hour  Intake --  Output 700 ml  Net -700 ml      PHYSICAL EXAM  General: Well developed, well nourished, in no acute distress HEENT:  Normocephalic and atramatic Neck:  No JVD.  Lungs: Clear bilaterally to auscultation and percussion. Heart: HRRR . Normal S1 and S2 without gallops or murmurs.  Abdomen: Bowel sounds are positive, abdomen soft and non-tender  Msk:  Back normal, normal gait. Normal strength and tone for age. Extremities: No clubbing, cyanosis or edema.   Neuro: Alert and oriented X 3. Psych:  Good affect, responds appropriately   LABS: Basic Metabolic Panel: Recent Labs    03/07/20 1227 03/07/20 1807 03/08/20 0543  NA 137  --  139  K 4.3  --  4.1  CL 98  --  98  CO2 30  --  32  GLUCOSE 307*  --  215*  BUN 12  --  16  CREATININE 1.01  --  1.05  CALCIUM 9.2  --  8.7*  MG  --  2.2  --    Liver Function Tests: No results for input(s): AST, ALT, ALKPHOS, BILITOT, PROT, ALBUMIN in the last 72 hours. No results for input(s): LIPASE, AMYLASE in the last 72 hours. CBC: Recent Labs    03/07/20 1227 03/08/20 0543  WBC 6.2 7.2  HGB 17.7* 16.7  HCT 55.4* 51.6  MCV 93.9 93.6  PLT 150 143*   Cardiac Enzymes: No results for input(s): CKTOTAL, CKMB, CKMBINDEX, TROPONINI in the last 72 hours. BNP: Invalid input(s): POCBNP D-Dimer: No results for input(s): DDIMER in the last 72 hours. Hemoglobin A1C: Recent Labs    03/07/20 1644  HGBA1C 7.9*    Fasting Lipid Panel: Recent Labs    03/08/20 0543  CHOL 170  HDL 28*  LDLCALC UNABLE TO CALCULATE IF TRIGLYCERIDE OVER 400 mg/dL  TRIG 514*  CHOLHDL 6.1   Thyroid Function Tests: No results for input(s): TSH, T4TOTAL, T3FREE, THYROIDAB in the last 72 hours.  Invalid input(s): FREET3 Anemia Panel: No results for input(s): VITAMINB12, FOLATE, FERRITIN, TIBC, IRON, RETICCTPCT in the last 72 hours.  DG Chest 2 View  Result Date: 03/07/2020 CLINICAL DATA:  Pt presents to ED c/o L-sided chest pain 9/10 starting yesterday. Pt states he took 3 NTG yesterday with relief and 3 today, last one at 1200, with no relief today. Extensive cardiac hx, states last cath last year. former smoker quit in 2014 EXAM: CHEST - 2 VIEW COMPARISON:  10/04/2019 FINDINGS: Stable changes from previous cardiac surgery. The cardiac silhouette is normal in size and configuration. No mediastinal or hilar masses. Linear opacities are noted in the lung bases consistent a combination of prominent bronchovascular markings and scarring or atelectasis. Lungs are hyperexpanded but otherwise clear. No pleural effusion or pneumothorax. Skeletal structures are demineralized but intact. IMPRESSION: 1.  No acute cardiopulmonary disease. 2. Hyperexpanded lungs suggesting COPD. Electronically Signed   By: Lajean Manes M.D.   On: 03/07/2020 13:12     Echo pending  TELEMETRY: Sinus rhythm:  ASSESSMENT AND PLAN:  Principal Problem:   Chest pain Active Problems:   HTN (hypertension)   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HCC)   HLD (hyperlipidemia)   COPD (chronic obstructive pulmonary disease) (HCC)   Chronic diastolic CHF (congestive heart failure) (HCC)   Anxiety   Paroxysmal atrial fibrillation (HCC)   Elevated troponin    1.  Chest pain, chronic in nature, with multiple hospitalizations for CHF and chest pain, with typical and atypical features, mildly elevated high-sensitivity troponin (152, 149, 145, 133,  117), without significant delta, trending downwards, with nondiagnostic ECG.  Patient reports chest pain relief is only accomplished with Dilaudid. 2.  Coronary artery disease, history of anterior STEMI, rescue CABG x2, atretic LIMA to LAD, status post multiple stents SVG to OM1, most recent cardiac catheterization 06/12/2019 revealed atretic LIMA to LAD, patent SVG to OM1, patent stents proximal LAD and proximal left circumflex.  After reviewing the previous 4 cardiac catheterizations, most performed via right femoral access, the one cardiac catheterization performed via left radial access, resulted in difficult cannulation of RCA and SVG to OM1 due to tortuosity of left subclavian artery. 3.  Paroxysmal atrial fibrillation, on Eliquis for stroke prevention, and sotalol for rhythm control.  The patient took Eliquis on 03/07/2020.  Recommendations  1.  Agree with overall current therapy 2.  Continue heparin drip for now 3.  Hold Eliquis 4.  Cardiac catheterization, tentatively scheduled for 03/11/2020 after holding Eliquis   Isaias Cowman, MD, PhD, [facility] 03/08/2020 7:58 AM

## 2020-03-08 NOTE — Care Management Obs Status (Signed)
[facility] NOTIFICATION   Patient Details  Name: Lawrence [name] MRN: [id number] Date of Birth: [birth year]   Medicare Observation Status Notification Given:  Yes    Shaylee Stanislawski E Enes Wegener, LCSW 03/08/2020, 1:33 PM

## 2020-03-08 NOTE — Consult Note (Signed)
[place] for Heparin Indication: chest pain/ACS  Allergies  Allergen Reactions  . Demerol  [Meperidine Hcl]   . Demerol [Meperidine] Hives  . Jardiance [Empagliflozin] Other (See Comments)    Perineal pain  . Prednisone Other (See Comments) and Hypertension    Pt states that this medication puts him in A-fib   . Sulfa Antibiotics Hives  . Albuterol Sulfate [Albuterol] Palpitations and Other (See Comments)    Pt currently uses this medication.    . Morphine Sulfate Nausea And Vomiting, Rash and Other (See Comments)    Pt states that he is only allergic to the tablet form of this medication.      Patient Measurements: Height: 5' 7"  (170.2 cm) Weight: 103 kg (227 lb) IBW/kg (Calculated) : 66.1 Heparin Dosing Weight: 88.7 kg  Vital Signs: Temp: 97.4 F (36.3 C) (04/23 1140) Temp Source: Oral (04/23 1140) BP: 108/68 (04/23 1140) Pulse Rate: 59 (04/23 1140)  Labs: Recent Labs    03/07/20 1227 03/07/20 1227 03/07/20 1408 03/07/20 1408 03/07/20 1807 03/07/20 2019 03/07/20 2223 03/07/20 2257 03/08/20 0543 03/08/20 1221  HGB 17.7*  --   --   --   --   --   --   --  16.7  --   HCT 55.4*  --   --   --   --   --   --   --  51.6  --   PLT 150  --   --   --   --   --   --   --  143*  --   APTT  --   --  32  --   --   --  102*  --  85*  --   LABPROT  --   --  13.1  --   --   --   --   --   --   --   INR  --   --  1.0  --   --   --   --   --   --   --   HEPARINUNFRC  --   --  1.08*  --   --   --   --   --  0.68 0.53  CREATININE 1.01  --   --   --   --   --   --   --  1.05  --   TROPONINIHS 152*   < > 149*   < > 145* 133*  --  117*  --   --    < > = values in this interval not displayed.    Estimated Creatinine Clearance: 79.2 mL/min (by C-G formula based on SCr of 1.05 mg/dL).   Medical History: Past Medical History:  Diagnosis Date  . A-fib ([place])   . Anemia   . Anginal pain ([place])   . Anxiety   . Arthritis   . Asthma   . CAD  (coronary artery disease)    a. 2002 CABGx2 (LIMA->LAD, VG->VG->OM1);  b. 09/2012 DES->OM;  c. 03/2015 PTCA of LAD [facility]) in setting of atretic LIMA; d. 05/2015 Cath [facility]): nonobs dzs; e. 06/2015 Cath ([facility]): LM nl, LAD 45p/d ISR, 50d, D1/2 small, LCX 50p/d ISR, OM1 70ost, 30 ISR, VG->OM1 50ost, 37m LIMA->LAD 99p/d - atretic, RCA dom, nl; f.cath 10/16: 40-50%(FFR 0.90) pLAD, 75% (FFR 0.77) mLAD s/p PCI/DES, oRCA 40% (FFR0.95)  . Cancer ([place]    SKIN CANCER ON BACK  . Celiac  disease   . Chronic diastolic CHF (congestive heart failure) ([place])    a. 06/2009 Echo: EF 60-65%, Gr 1 DD, triv AI, mildly dil LA, nl RV.  Marland Kitchen COPD (chronic obstructive pulmonary disease) ([place])    a. Chronic bronchitis and emphysema.  . DDD (degenerative disc disease), lumbar   . Diverticulosis   . Dysrhythmia   . Essential hypertension   . GERD (gastroesophageal reflux disease)   . History of hiatal hernia   . History of kidney stones    H/O  . History of tobacco abuse    a. Quit 2014.  Marland Kitchen Myocardial infarction ([place]) 2002   4 STENTS  . Pancreatitis   . PSVT (paroxysmal supraventricular tachycardia) ([place])    a. 10/2012 Noted on Zio Patch.  . Sleep apnea    LOST CORD TO CPAP -ONLY 02 @ BEDTIME  . Tubular adenoma of colon   . Type II diabetes mellitus (HCC)     Medications:  Medications Prior to Admission  Medication Sig Dispense Refill Last Dose  . Albuterol Sulfate, sensor, 108 (90 Base) MCG/ACT AEPB Inhale 2 puffs into the lungs at bedtime. AND PRN   PRN  . allopurinol (ZYLOPRIM) 300 MG tablet TAKE 1 TABLET BY MOUTH TWICE A DAY 180 tablet 2 03/07/2020 at 1100  . ALPRAZolam (XANAX) 1 MG tablet Take 1 tablet (1 mg total) by mouth 3 (three) times daily. 90 tablet 4 03/07/2020 at 1100  . aspirin 81 MG chewable tablet Chew 81 mg by mouth daily.   03/07/2020 at 1100  . atorvastatin (LIPITOR) 80 MG tablet TAKE ONE TABLET BY MOUTH AT BEDTIME 90 tablet 1 03/06/2020 at Unknown time  . BREO ELLIPTA 100-25 MCG/INH AEPB INHALE 1  PUFF INTO THE LUNGS DAILY 60 each 5 03/07/2020 at 1100  . budesonide (PULMICORT) 0.5 MG/2ML nebulizer solution Take 0.5 mg by nebulization 2 (two) times daily.   PRN at PRN  . cetirizine (ZYRTEC) 10 MG tablet TAKE ONE TABLET BY MOUTH AT BEDTIME 30 tablet 6 03/06/2020 at Unknown time  . Cholecalciferol (VITAMIN D3) 125 MCG (5000 UT) CAPS Take 5,000 Units by mouth daily.   03/07/2020 at Unknown time  . dapagliflozin propanediol (FARXIGA) 10 MG TABS tablet Take 10 mg by mouth daily.   03/07/2020 at 11:00  . docusate sodium (COLACE) 100 MG capsule Take 100 mg by mouth daily.     at 11:00  . ELIQUIS 5 MG TABS tablet TAKE 1 TABLET BY MOUTH TWICE A DAY 180 tablet 0 03/07/2020 at 1100  . isosorbide mononitrate (IMDUR) 120 MG 24 hr tablet Take 1 tablet (120 mg total) by mouth daily. 30 tablet 5 03/07/2020 at 0900  . LINZESS 72 MCG capsule Take 72 mcg by mouth daily before breakfast.    03/07/2020 at 1100  . lisinopril (ZESTRIL) 10 MG tablet Take 10 mg by mouth daily.   03/07/2020 at 1100  . magnesium oxide (MAG-OX) 400 (241.3 Mg) MG tablet Take 1 tablet (400 mg total) by mouth every morning. 30 tablet 5 03/07/2020 at 1100  . methocarbamol (ROBAXIN) 500 MG tablet TAKE 1 TABLET BY MOUTH 4 TIMES DAILY 60 tablet 3 03/07/2020 at 1100  . nitroGLYCERIN (NITROSTAT) 0.4 MG SL tablet Place 1 tablet (0.4 mg total) under the tongue every 5 (five) minutes as needed for chest pain. Reported on 05/26/2016 30 tablet 3 PRN at PRN  . omega-3 acid ethyl esters (LOVAZA) 1 g capsule Take 4 g by mouth daily.   03/07/2020 at 1100  .  oxyCODONE-acetaminophen (PERCOCET) 10-325 MG tablet TAKE 1 TABLET BY MOUTH EVERY 4 HOURS 150 tablet 0 03/07/2020 at 1000  . ranolazine (RANEXA) 1000 MG SR tablet Take 1,000 mg by mouth 2 (two) times daily.   03/07/2020 at Unknown time  . sotalol (BETAPACE) 80 MG tablet Take 80 mg by mouth 2 (two) times daily.   03/07/2020 at 1100  . tiotropium (SPIRIVA) 18 MCG inhalation capsule Place 18 mcg into inhaler and inhale  daily.   03/07/2020 at 1100  . torsemide (DEMADEX) 100 MG tablet Take 50 mg by mouth daily.    PRN at PRN  . traZODone (DESYREL) 100 MG tablet Take 100 mg by mouth at bedtime.   03/06/2020 at 2130  . venlafaxine XR (EFFEXOR-XR) 75 MG 24 hr capsule Take 75 mg by mouth daily with breakfast.   Not Taking at Unknown time   Scheduled:  . allopurinol  300 mg Oral BID  . ALPRAZolam  1 mg Oral TID  . aspirin  81 mg Oral Daily  . atorvastatin  80 mg Oral QHS  . budesonide  0.5 mg Nebulization BID  . cholecalciferol  5,000 Units Oral Daily  . docusate sodium  100 mg Oral Daily  . fluticasone furoate-vilanterol  1 puff Inhalation Daily  . insulin aspart  0-5 Units Subcutaneous QHS  . insulin aspart  0-9 Units Subcutaneous TID WC  . insulin glargine  10 Units Subcutaneous Daily  . isosorbide mononitrate  120 mg Oral Daily  . linaclotide  72 mcg Oral QAC breakfast  . lisinopril  10 mg Oral Daily  . loratadine  10 mg Oral Daily  . magnesium oxide  400 mg Oral [id number]  . methocarbamol  500 mg Oral QID  . omega-3 acid ethyl esters  4 g Oral Daily  . ranolazine  1,000 mg Oral BID  . sotalol  80 mg Oral Q12H  . tiotropium  18 mcg Inhalation Daily  . traZODone  100 mg Oral QHS   Infusions:  . heparin 1,000 Units/hr (03/08/20 0835)   PRN:  Anti-infectives (From admission, onward)   None      Assessment: Pharmacy consulted to start heparin for NSTEMI. Pt is on apixaban PTA for afib. hgb and plt stable. Will order baseline aPTT, HL, and INR. Will monitor with aPTT levels until heparin level and aPTT correlate.   04/23 @ 0543 HL 0.68. Therapeutic. 04/23 @ 0543 aPTT 85.  Therapeutic. 04/23 @ 1243 HL 0.53, Therapeutic.   Goal of Therapy:  Heparin level 0.3-0.7 units/ml Monitor platelets by anticoagulation protocol: Yes   Plan:   Will continue the current heparin rate of 1000 units/hr. Will recheck HL and CBC with AM labs.    Rowland Lathe, PharmD 03/08/2020,1:08 PM

## 2020-03-09 LAB — BASIC METABOLIC PANEL
Anion gap: 6 (ref 5–15)
BUN: 20 mg/dL (ref 8–23)
CO2: 32 mmol/L (ref 22–32)
Calcium: 8.8 mg/dL — ABNORMAL LOW (ref 8.9–10.3)
Chloride: 98 mmol/L (ref 98–111)
Creatinine, Ser: 1.07 mg/dL (ref 0.61–1.24)
GFR calc Af Amer: >60 mL/min (ref >60)
GFR calc non Af Amer: >60 mL/min (ref >60)
Glucose, Bld: 193 mg/dL — ABNORMAL HIGH (ref 70–99)
Potassium: 5.2 mmol/L — ABNORMAL HIGH (ref 3.5–5.1)
Sodium: 136 mmol/L (ref 135–145)

## 2020-03-09 LAB — GLUCOSE, CAPILLARY
Glucose-Capillary: 172 mg/dL — ABNORMAL HIGH (ref 70–99)
Glucose-Capillary: 161 mg/dL — ABNORMAL HIGH (ref 70–99)
Glucose-Capillary: 140 mg/dL — ABNORMAL HIGH (ref 70–99)
Glucose-Capillary: 127 mg/dL — ABNORMAL HIGH (ref 70–99)

## 2020-03-09 LAB — CBC
HCT: 52.5 % — ABNORMAL HIGH (ref 39.0–52.0)
Hemoglobin: 16.6 g/dL (ref 13.0–17.0)
MCH: 30.2 pg (ref 26.0–34.0)
MCHC: 31.6 g/dL (ref 30.0–36.0)
MCV: 95.5 fL (ref 80.0–100.0)
Platelets: 138 10*3/uL — ABNORMAL LOW (ref 150–400)
RBC: 5.5 MIL/uL (ref 4.22–5.81)
RDW: 14.6 % (ref 11.5–15.5)
WBC: 7.6 10*3/uL (ref 4.0–10.5)
nRBC: 0 % (ref 0.0–0.2)

## 2020-03-09 LAB — POTASSIUM: Potassium: 4 mmol/L (ref 3.5–5.1)

## 2020-03-09 LAB — HEPARIN LEVEL (UNFRACTIONATED): Heparin Unfractionated: 0.4 IU/mL (ref 0.30–0.70)

## 2020-03-09 MED ORDER — SODIUM POLYSTYRENE SULFONATE 15 GM/60ML PO SUSP
30.0000 g | Freq: Once | ORAL | Status: AC
  Administered 2020-03-09: 30 g via ORAL
  Filled 2020-03-09 (×2): qty 120

## 2020-03-09 NOTE — Progress Notes (Signed)
PROGRESS NOTE    KALEV [name]  JEH:[id number] DOB: [birth year] DOA: 03/07/2020 PCP: Birdie Sons, MD (Confirm with patient/family/NH records and if not entered, this HAS to be entered at [facility] point of entry. "No PCP" if truly none.)   Brief Narrative: (Start on day 1 of progress note - keep it brief and live) 64 year old male with history of essential hypertension, dyslipidemia, type 2 diabetes, COPD, asthma, off of track sleep apnea, history of pancreatitis, chronic diastolic congestive heart failure, coronary disease with history of CABG, atrial fibrillation on Eliquis.  Patient came to the hospital complaint chest pain started 2 days ago.  He had a peak troponin was 152, dropped down to 117.  Discussed with cardiology, patient last heart cath did not show significant occlusion.  Most likely small vessel disease.  Scheduled repeat coronary angiogram on Monday.   Assessment & Plan:   Principal Problem:   Chest pain Active Problems:   HTN (hypertension)   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HCC)   HLD (hyperlipidemia)   COPD (chronic obstructive pulmonary disease) (HCC)   Chronic diastolic CHF (congestive heart failure) (HCC)   Anxiety   Paroxysmal atrial fibrillation (HCC)   Elevated troponin   Non-STEMI (non-ST elevated myocardial infarction) ([place])  #1.  Non-STEMI. Patient condition appears to be secondary to small vessel disease.  Currently still has some chest pain.  Short of breath is at baseline.  Troponin has trended down.  Continue heparin drip while holding Eliquis.  Coronary angiogram is scheduled for Monday.  #2.  Paroxysmal atrial fibrillation. Continue heparin drip.  Heart rate under control.  3.  COPD with chronic hypoxemic respiratory failure. Condition still stable.  4.  Type 2 diabetes with hyperglycemia. Continue Lantus and sliding scale insulin.  5.  Essential hypertension. Continue home medicines including lisinopril.  6.  Mild  hyperkalemia. Potassium 5.2.  We will give a dose Kayexalate.  Recheck potassium level this afternoon.  Also recheck tomorrow morning, if patient continued to have a high potassium, we may have to discontinue lisinopril.  For now, will monitor.   DVT prophylaxis: Heparin drip Code Status: Full Family Communication: Plan discussed with patient, all questions answered.  Disposition Plan:  . Patient came from: Home            . Anticipated d/c place:Home . Barriers to d/c OR conditions which need to be met to effect a safe d/c:   Consultants:   Cardiology  Procedures: None Antimicrobials: None  Subjective: Patient was able to sleep on and off for last night.  Still complaining of chest pain.  No shortness of breath at rest, still taking oxygen at baseline level. Had 2 real soft stools overnight.  C. difficile toxin was negative.  No abdominal pain or nausea vomiting.  Appetite is well.  Objective: Vitals:   03/08/20 1939 03/09/20 0243 03/09/20 0356 03/09/20 0731  BP: 95/60 134/73 106/62 105/62  Pulse: (!) 55 (!) 58 73 63  Resp: 20 20 20 19   Temp: (!) 97.4 F (36.3 C) 98 F (36.7 C) 98.1 F (36.7 C) 98.4 F (36.9 C)  TempSrc: Oral Oral Oral   SpO2: 94% 97% 94% 94%  Weight:   103.7 kg   Height:        Intake/Output Summary (Last 24 hours) at 03/09/2020 0737 Last data filed at 03/08/2020 1415 Gross per 24 hour  Intake --  Output 325 ml  Net -325 ml   Filed Weights   03/07/20 1224 03/09/20 0356  Weight: 103 kg 103.7 kg    Examination:  General exam: Appears calm and comfortable  Respiratory system: Clear to auscultation. Respiratory effort normal. Cardiovascular system: S1 & S2 heard, RRR. No JVD, murmurs, rubs, gallops or clicks. No pedal edema. Gastrointestinal system: Abdomen is nondistended, soft and nontender. No organomegaly or masses felt. Normal bowel sounds heard. Central nervous system: Alert and oriented. No focal neurological deficits. Extremities:  Symmetric 5 x 5 power. Skin: No rashes, lesions or ulcers Psychiatry: Judgement and insight appear normal. Mood & affect appropriate.     Data Reviewed: I have personally reviewed following labs and imaging studies  CBC: Recent Labs  Lab 03/07/20 1227 03/08/20 0543 03/09/20 0439  WBC 6.2 7.2 7.6  HGB 17.7* 16.7 16.6  HCT 55.4* 51.6 52.5*  MCV 93.9 93.6 95.5  PLT 150 143* 517*   Basic Metabolic Panel: Recent Labs  Lab 03/07/20 1227 03/07/20 1807 03/08/20 0543 03/09/20 0439  NA 137  --  139 136  K 4.3  --  4.1 5.2*  CL 98  --  98 98  CO2 30  --  32 32  GLUCOSE 307*  --  215* 193*  BUN 12  --  16 20  CREATININE 1.01  --  1.05 1.07  CALCIUM 9.2  --  8.7* 8.8*  MG  --  2.2  --   --    GFR: Estimated Creatinine Clearance: 77.9 mL/min (by C-G formula based on SCr of 1.07 mg/dL). Liver Function Tests: No results for input(s): AST, ALT, ALKPHOS, BILITOT, PROT, ALBUMIN in the last 168 hours. No results for input(s): LIPASE, AMYLASE in the last 168 hours. No results for input(s): AMMONIA in the last 168 hours. Coagulation Profile: Recent Labs  Lab 03/07/20 1408  INR 1.0   Cardiac Enzymes: No results for input(s): CKTOTAL, CKMB, CKMBINDEX, TROPONINI in the last 168 hours. BNP (last 3 results) No results for input(s): PROBNP in the last 8760 hours. HbA1C: Recent Labs    03/07/20 1644  HGBA1C 7.9*   CBG: Recent Labs  Lab 03/08/20 0811 03/08/20 1141 03/08/20 1653 03/08/20 2113 03/09/20 0731  GLUCAP 202* 231* 176* 191* 172*   Lipid Profile: Recent Labs    03/08/20 0543  CHOL 170  HDL 28*  LDLCALC UNABLE TO CALCULATE IF TRIGLYCERIDE OVER 400 mg/dL  TRIG 514*  CHOLHDL 6.1  LDLDIRECT 84.1   Thyroid Function Tests: No results for input(s): TSH, T4TOTAL, FREET4, T3FREE, THYROIDAB in the last 72 hours. Anemia Panel: No results for input(s): VITAMINB12, FOLATE, FERRITIN, TIBC, IRON, RETICCTPCT in the last 72 hours. Sepsis Labs: No results for input(s):  PROCALCITON, LATICACIDVEN in the last 168 hours.  Recent Results (from the past 240 hour(s))  Respiratory Panel by RT PCR (Flu A&B, Covid) - Nasopharyngeal Swab     Status: None   Collection Time: 03/07/20  4:44 PM   Specimen: Nasopharyngeal Swab  Result Value Ref Range Status   SARS Coronavirus 2 by RT PCR NEGATIVE NEGATIVE Final    Comment: (NOTE) SARS-CoV-2 target nucleic acids are NOT DETECTED. The SARS-CoV-2 RNA is generally detectable in upper respiratoy specimens during the acute phase of infection. The lowest concentration of SARS-CoV-2 viral copies this assay can detect is 131 copies/mL. A negative result does not preclude SARS-Cov-2 infection and should not be used as the sole basis for treatment or other patient management decisions. A negative result may occur with  improper specimen collection/handling, submission of specimen other than nasopharyngeal swab, presence of viral mutation(s) within  the areas targeted by this assay, and inadequate number of viral copies (<131 copies/mL). A negative result must be combined with clinical observations, patient history, and epidemiological information. The expected result is Negative. Fact Sheet for Patients:  PinkCheek.be Fact Sheet for Healthcare Providers:  GravelBags.it This test is not yet ap proved or cleared by the [place] FDA and  has been authorized for detection and/or diagnosis of SARS-CoV-2 by FDA under an Emergency Use Authorization (EUA). This EUA will remain  in effect (meaning this test can be used) for the duration of the COVID-19 declaration under Section 564(b)(1) of the Act, 21 U.S.C. section 360bbb-3(b)(1), unless the authorization is terminated or revoked sooner.    Influenza A by PCR NEGATIVE NEGATIVE Final   Influenza B by PCR NEGATIVE NEGATIVE Final    Comment: (NOTE) The Xpert Xpress SARS-CoV-2/FLU/RSV assay is intended as an aid in  the  diagnosis of influenza from Nasopharyngeal swab specimens and  should not be used as a sole basis for treatment. Nasal washings and  aspirates are unacceptable for Xpert Xpress SARS-CoV-2/FLU/RSV  testing. Fact Sheet for Patients: PinkCheek.be Fact Sheet for Healthcare Providers: GravelBags.it This test is not yet approved or cleared by the [place] FDA and  has been authorized for detection and/or diagnosis of SARS-CoV-2 by  FDA under an Emergency Use Authorization (EUA). This EUA will remain  in effect (meaning this test can be used) for the duration of the  Covid-19 declaration under Section 564(b)(1) of the Act, 21  U.S.C. section 360bbb-3(b)(1), unless the authorization is  terminated or revoked. Performed at [facility], [place], [place] [address]   C Difficile Quick Screen w PCR reflex     Status: None   Collection Time: 03/08/20  3:25 PM   Specimen: STOOL  Result Value Ref Range Status   C Diff antigen NEGATIVE NEGATIVE Final   C Diff toxin NEGATIVE NEGATIVE Final   C Diff interpretation No C. difficile detected.  Final    Comment: Performed at [facility], [address], [place], [place] [address]         Radiology Studies: DG Chest 2 View  Result Date: 03/07/2020 CLINICAL DATA:  Pt presents to ED c/o L-sided chest pain 9/10 starting yesterday. Pt states he took 3 NTG yesterday with relief and 3 today, last one at 1200, with no relief today. Extensive cardiac hx, states last cath last year. former smoker quit in 2014 EXAM: CHEST - 2 VIEW COMPARISON:  10/04/2019 FINDINGS: Stable changes from previous cardiac surgery. The cardiac silhouette is normal in size and configuration. No mediastinal or hilar masses. Linear opacities are noted in the lung bases consistent a combination of prominent bronchovascular markings and scarring or atelectasis. Lungs are hyperexpanded but  otherwise clear. No pleural effusion or pneumothorax. Skeletal structures are demineralized but intact. IMPRESSION: 1. No acute cardiopulmonary disease. 2. Hyperexpanded lungs suggesting COPD. Electronically Signed   By: Lajean Manes M.D.   On: 03/07/2020 13:12   ECHOCARDIOGRAM COMPLETE  Result Date: 03/08/2020    ECHOCARDIOGRAM REPORT   Patient Name:   Lawrence [name] [facility] Date of Exam: 03/08/2020 Medical Rec #:  [id number]       Height:       67.0 in Accession #:    [id number]      Weight:       227.0 lb Date of Birth:  02-24-1956       BSA:  2.134 m Patient Age:    76 years        BP:           106/64 mmHg Patient Gender: M               HR:           58 bpm. Exam Location:  [facility] Procedure: 2D Echo, Cardiac Doppler and Color Doppler Indications:     Chest pain 786.50  History:         Patient has prior history of Echocardiogram examinations, most                  recent 06/11/2019. COPD; Risk Factors:Sleep Apnea and Diabetes.                  MI.  Sonographer:     Sherrie Sport RDCS (AE) Referring Phys:  [place] Diagnosing Phys: Isaias Cowman MD  Sonographer Comments: Suboptimal apical window. IMPRESSIONS  1. Left ventricular ejection fraction, by estimation, is 45 to 50%. The left ventricle has mildly decreased function. The left ventricle has no regional wall motion abnormalities. Left ventricular diastolic parameters are indeterminate.  2. Right ventricular systolic function is normal. The right ventricular size is normal. There is normal pulmonary artery systolic pressure.  3. The mitral valve is normal in structure. Mild mitral valve regurgitation. No evidence of mitral stenosis.  4. The aortic valve is normal in structure. Aortic valve regurgitation is not visualized. No aortic stenosis is present.  5. The inferior vena cava is normal in size with greater than 50% respiratory variability, suggesting right atrial pressure of 3 mmHg. FINDINGS  Left Ventricle: Left ventricular  ejection fraction, by estimation, is 45 to 50%. The left ventricle has mildly decreased function. The left ventricle has no regional wall motion abnormalities. The left ventricular internal cavity size was normal in size. There is no left ventricular hypertrophy. Left ventricular diastolic parameters are indeterminate. Right Ventricle: The right ventricular size is normal. No increase in right ventricular wall thickness. Right ventricular systolic function is normal. There is normal pulmonary artery systolic pressure. The tricuspid regurgitant velocity is 1.86 m/s, and  with an assumed right atrial pressure of 10 mmHg, the estimated right ventricular systolic pressure is 73.7 mmHg. Left Atrium: Left atrial size was normal in size. Right Atrium: Right atrial size was normal in size. Pericardium: There is no evidence of pericardial effusion. Mitral Valve: The mitral valve is normal in structure. Normal mobility of the mitral valve leaflets. Mild mitral valve regurgitation. No evidence of mitral valve stenosis. Tricuspid Valve: The tricuspid valve is normal in structure. Tricuspid valve regurgitation is mild . No evidence of tricuspid stenosis. Aortic Valve: The aortic valve is normal in structure. Aortic valve regurgitation is not visualized. No aortic stenosis is present. Aortic valve mean gradient measures 2.5 mmHg. Aortic valve peak gradient measures 5.5 mmHg. Aortic valve area, by VTI measures 3.67 cm. Pulmonic Valve: The pulmonic valve was normal in structure. Pulmonic valve regurgitation is not visualized. No evidence of pulmonic stenosis. Aorta: The aortic root is normal in size and structure. Venous: The inferior vena cava is normal in size with greater than 50% respiratory variability, suggesting right atrial pressure of 3 mmHg. IAS/Shunts: No atrial level shunt detected by color flow Doppler.  LEFT VENTRICLE PLAX 2D LVIDd:         3.42 cm LVIDs:         2.55 cm LV PW:  1.04 cm LV IVS:        1.71 cm  LVOT diam:     2.20 cm LV SV:         85 LV SV Index:   40 LVOT Area:     3.80 cm  RIGHT VENTRICLE RV Basal diam:  3.45 cm RV S prime:     9.25 cm/s TAPSE (M-mode): 3.0 cm LEFT ATRIUM              Index       RIGHT ATRIUM           Index LA diam:        3.50 cm  1.64 cm/m  RA Area:     22.80 cm LA Vol (A2C):   119.0 ml 55.76 ml/m RA Volume:   71.60 ml  33.55 ml/m LA Vol (A4C):   59.2 ml  27.74 ml/m LA Biplane Vol: 90.1 ml  42.22 ml/m  AORTIC VALVE                   PULMONIC VALVE AV Area (Vmax):    3.69 cm    PV Vmax:        0.67 m/s AV Area (Vmean):   4.09 cm    PV Peak grad:   1.8 mmHg AV Area (VTI):     3.67 cm    RVOT Peak grad: 2 mmHg AV Vmax:           117.50 cm/s AV Vmean:          76.400 cm/s AV VTI:            0.232 m AV Peak Grad:      5.5 mmHg AV Mean Grad:      2.5 mmHg LVOT Vmax:         114.00 cm/s LVOT Vmean:        82.300 cm/s LVOT VTI:          0.224 m LVOT/AV VTI ratio: 0.97  AORTA Ao Root diam: 3.20 cm TRICUSPID VALVE TR Peak grad:   13.8 mmHg TR Vmax:        186.00 cm/s  SHUNTS Systemic VTI:  0.22 m Systemic Diam: 2.20 cm Isaias Cowman MD Electronically signed by Isaias Cowman MD Signature Date/Time: 03/08/2020/1:07:36 PM    Final         Scheduled Meds: . allopurinol  300 mg Oral BID  . ALPRAZolam  1 mg Oral TID  . aspirin  81 mg Oral Daily  . atorvastatin  80 mg Oral QHS  . budesonide  0.5 mg Nebulization BID  . cholecalciferol  5,000 Units Oral Daily  . docusate sodium  100 mg Oral Daily  . fluticasone furoate-vilanterol  1 puff Inhalation Daily  . insulin aspart  0-5 Units Subcutaneous QHS  . insulin aspart  0-9 Units Subcutaneous TID WC  . insulin glargine  10 Units Subcutaneous Daily  . isosorbide mononitrate  120 mg Oral Daily  . linaclotide  72 mcg Oral QAC breakfast  . lisinopril  10 mg Oral Daily  . loratadine  10 mg Oral Daily  . magnesium oxide  400 mg Oral BH-q7a  . methocarbamol  500 mg Oral QID  . omega-3 acid ethyl esters  4 g Oral  Daily  . ranolazine  1,000 mg Oral BID  . sodium polystyrene  30 g Oral Once  . sotalol  80 mg Oral Q12H  . tiotropium  18 mcg Inhalation Daily  .  traZODone  100 mg Oral QHS   Continuous Infusions: . heparin 1,000 Units/hr (03/08/20 0835)     LOS: 1 day    Time spent: 25 mins    Sharen Hones, MD [facility]   To contact the attending provider between 7A-7P or the covering provider during after hours 7P-7A, please log into the web site [email or web address] and access using universal [place] password for that web site. If you do not have the password, please call the hospital operator.  03/09/2020, 7:37 AM

## 2020-03-09 NOTE — Consult Note (Signed)
[place] for Heparin Indication: chest pain/ACS  Allergies  Allergen Reactions  . Demerol  [Meperidine Hcl]   . Demerol [Meperidine] Hives  . Jardiance [Empagliflozin] Other (See Comments)    Perineal pain  . Prednisone Other (See Comments) and Hypertension    Pt states that this medication puts him in A-fib   . Sulfa Antibiotics Hives  . Albuterol Sulfate [Albuterol] Palpitations and Other (See Comments)    Pt currently uses this medication.    . Morphine Sulfate Nausea And Vomiting, Rash and Other (See Comments)    Pt states that he is only allergic to the tablet form of this medication.      Patient Measurements: Height: 5' 7"  (170.2 cm) Weight: 103.7 kg (228 lb 11.2 oz) IBW/kg (Calculated) : 66.1 Heparin Dosing Weight: 88.7 kg  Vital Signs: Temp: 98.1 F (36.7 C) (04/24 0356) Temp Source: Oral (04/24 0356) BP: 106/62 (04/24 0356) Pulse Rate: 73 (04/24 0356)  Labs: Recent Labs    03/07/20 1227 03/07/20 1227 03/07/20 1408 03/07/20 1408 03/07/20 1807 03/07/20 2019 03/07/20 2223 03/07/20 2257 03/08/20 0543 03/08/20 1221 03/09/20 0439  HGB 17.7*  --   --    < >  --   --   --   --  16.7  --  16.6  HCT 55.4*  --   --   --   --   --   --   --  51.6  --  52.5*  PLT 150  --   --   --   --   --   --   --  143*  --  138*  APTT  --   --  32  --   --   --  102*  --  85*  --   --   LABPROT  --   --  13.1  --   --   --   --   --   --   --   --   INR  --   --  1.0  --   --   --   --   --   --   --   --   HEPARINUNFRC  --   --  1.08*   < >  --   --   --   --  0.68 0.53 0.40  CREATININE 1.01  --   --   --   --   --   --   --  1.05  --  1.07  TROPONINIHS 152*   < > 149*   < > 145* 133*  --  117*  --   --   --    < > = values in this interval not displayed.    Estimated Creatinine Clearance: 77.9 mL/min (by C-G formula based on SCr of 1.07 mg/dL).   Medical History: Past Medical History:  Diagnosis Date  . A-fib ([place])   . Anemia    . Anginal pain ([place])   . Anxiety   . Arthritis   . Asthma   . CAD (coronary artery disease)    a. 2002 CABGx2 (LIMA->LAD, VG->VG->OM1);  b. 09/2012 DES->OM;  c. 03/2015 PTCA of LAD [facility]) in setting of atretic LIMA; d. 05/2015 Cath [facility]): nonobs dzs; e. 06/2015 Cath ([facility]): LM nl, LAD 45p/d ISR, 50d, D1/2 small, LCX 50p/d ISR, OM1 70ost, 30 ISR, VG->OM1 50ost, 58m LIMA->LAD 99p/d - atretic, RCA dom, nl; f.cath 10/16: 40-50%(FFR 0.90) pLAD,  75% (FFR 0.77) mLAD s/p PCI/DES, oRCA 40% (FFR0.95)  . Cancer ([place])    SKIN CANCER ON BACK  . Celiac disease   . Chronic diastolic CHF (congestive heart failure) ([place])    a. 06/2009 Echo: EF 60-65%, Gr 1 DD, triv AI, mildly dil LA, nl RV.  Marland Kitchen COPD (chronic obstructive pulmonary disease) ([place])    a. Chronic bronchitis and emphysema.  . DDD (degenerative disc disease), lumbar   . Diverticulosis   . Dysrhythmia   . Essential hypertension   . GERD (gastroesophageal reflux disease)   . History of hiatal hernia   . History of kidney stones    H/O  . History of tobacco abuse    a. Quit 2014.  Marland Kitchen Myocardial infarction ([place]) 2002   4 STENTS  . Pancreatitis   . PSVT (paroxysmal supraventricular tachycardia) ([place])    a. 10/2012 Noted on Zio Patch.  . Sleep apnea    LOST CORD TO CPAP -ONLY 02 @ BEDTIME  . Tubular adenoma of colon   . Type II diabetes mellitus (HCC)     Medications:  Medications Prior to Admission  Medication Sig Dispense Refill Last Dose  . Albuterol Sulfate, sensor, 108 (90 Base) MCG/ACT AEPB Inhale 2 puffs into the lungs at bedtime. AND PRN   PRN  . allopurinol (ZYLOPRIM) 300 MG tablet TAKE 1 TABLET BY MOUTH TWICE A DAY 180 tablet 2 03/07/2020 at 1100  . ALPRAZolam (XANAX) 1 MG tablet Take 1 tablet (1 mg total) by mouth 3 (three) times daily. 90 tablet 4 03/07/2020 at 1100  . aspirin 81 MG chewable tablet Chew 81 mg by mouth daily.   03/07/2020 at 1100  . atorvastatin (LIPITOR) 80 MG tablet TAKE ONE TABLET BY MOUTH AT BEDTIME 90 tablet 1  03/06/2020 at Unknown time  . BREO ELLIPTA 100-25 MCG/INH AEPB INHALE 1 PUFF INTO THE LUNGS DAILY 60 each 5 03/07/2020 at 1100  . budesonide (PULMICORT) 0.5 MG/2ML nebulizer solution Take 0.5 mg by nebulization 2 (two) times daily.   PRN at PRN  . cetirizine (ZYRTEC) 10 MG tablet TAKE ONE TABLET BY MOUTH AT BEDTIME 30 tablet 6 03/06/2020 at Unknown time  . Cholecalciferol (VITAMIN D3) 125 MCG (5000 UT) CAPS Take 5,000 Units by mouth daily.   03/07/2020 at Unknown time  . dapagliflozin propanediol (FARXIGA) 10 MG TABS tablet Take 10 mg by mouth daily.   03/07/2020 at 11:00  . docusate sodium (COLACE) 100 MG capsule Take 100 mg by mouth daily.     at 11:00  . ELIQUIS 5 MG TABS tablet TAKE 1 TABLET BY MOUTH TWICE A DAY 180 tablet 0 03/07/2020 at 1100  . isosorbide mononitrate (IMDUR) 120 MG 24 hr tablet Take 1 tablet (120 mg total) by mouth daily. 30 tablet 5 03/07/2020 at 0900  . LINZESS 72 MCG capsule Take 72 mcg by mouth daily before breakfast.    03/07/2020 at 1100  . lisinopril (ZESTRIL) 10 MG tablet Take 10 mg by mouth daily.   03/07/2020 at 1100  . magnesium oxide (MAG-OX) 400 (241.3 Mg) MG tablet Take 1 tablet (400 mg total) by mouth every morning. 30 tablet 5 03/07/2020 at 1100  . methocarbamol (ROBAXIN) 500 MG tablet TAKE 1 TABLET BY MOUTH 4 TIMES DAILY 60 tablet 3 03/07/2020 at 1100  . nitroGLYCERIN (NITROSTAT) 0.4 MG SL tablet Place 1 tablet (0.4 mg total) under the tongue every 5 (five) minutes as needed for chest pain. Reported on 05/26/2016 30 tablet 3 PRN at  PRN  . omega-3 acid ethyl esters (LOVAZA) 1 g capsule Take 4 g by mouth daily.   03/07/2020 at 1100  . oxyCODONE-acetaminophen (PERCOCET) 10-325 MG tablet TAKE 1 TABLET BY MOUTH EVERY 4 HOURS 150 tablet 0 03/07/2020 at 1000  . ranolazine (RANEXA) 1000 MG SR tablet Take 1,000 mg by mouth 2 (two) times daily.   03/07/2020 at Unknown time  . sotalol (BETAPACE) 80 MG tablet Take 80 mg by mouth 2 (two) times daily.   03/07/2020 at 1100  . tiotropium  (SPIRIVA) 18 MCG inhalation capsule Place 18 mcg into inhaler and inhale daily.   03/07/2020 at 1100  . torsemide (DEMADEX) 100 MG tablet Take 50 mg by mouth daily.    PRN at PRN  . traZODone (DESYREL) 100 MG tablet Take 100 mg by mouth at bedtime.   03/06/2020 at 2130  . venlafaxine XR (EFFEXOR-XR) 75 MG 24 hr capsule Take 75 mg by mouth daily with breakfast.   Not Taking at Unknown time   Scheduled:  . allopurinol  300 mg Oral BID  . ALPRAZolam  1 mg Oral TID  . aspirin  81 mg Oral Daily  . atorvastatin  80 mg Oral QHS  . budesonide  0.5 mg Nebulization BID  . cholecalciferol  5,000 Units Oral Daily  . docusate sodium  100 mg Oral Daily  . fluticasone furoate-vilanterol  1 puff Inhalation Daily  . insulin aspart  0-5 Units Subcutaneous QHS  . insulin aspart  0-9 Units Subcutaneous TID WC  . insulin glargine  10 Units Subcutaneous Daily  . isosorbide mononitrate  120 mg Oral Daily  . linaclotide  72 mcg Oral QAC breakfast  . lisinopril  10 mg Oral Daily  . loratadine  10 mg Oral Daily  . magnesium oxide  400 mg Oral BH-q7a  . methocarbamol  500 mg Oral QID  . omega-3 acid ethyl esters  4 g Oral Daily  . ranolazine  1,000 mg Oral BID  . sotalol  80 mg Oral Q12H  . tiotropium  18 mcg Inhalation Daily  . traZODone  100 mg Oral QHS   Infusions:  . heparin 1,000 Units/hr (03/08/20 0835)   PRN:  Anti-infectives (From admission, onward)   None      Assessment: Pharmacy consulted to start heparin for NSTEMI. Pt is on apixaban PTA for afib. hgb and plt stable. Will order baseline aPTT, HL, and INR. Will monitor with aPTT levels until heparin level and aPTT correlate.   04/23 @ 0543 HL 0.68. Therapeutic. 04/23 @ 0543 aPTT 85.  Therapeutic. 04/23 @ 1243 HL 0.53, Therapeutic.   Goal of Therapy:  Heparin level 0.3-0.7 units/ml Monitor platelets by anticoagulation protocol: Yes   Plan:  04/24 @ 0500 HL 0.40 therapeutic. Will continue current rate and will recheck HL w/ am labs,  CBC stable will continue to monitor.  Tobie Lords, PharmD 03/09/2020,6:09 AM

## 2020-03-09 NOTE — Progress Notes (Signed)
[facility] Cardiology  SUBJECTIVE: Patient laying in bed, continues complain of mild chest discomfort   Vitals:   03/09/20 0243 03/09/20 0356 03/09/20 0731 03/09/20 1110  BP: 134/73 106/62 105/62 115/80  Pulse: (!) 58 73 63 99  Resp: 20 20 19 19   Temp: 98 F (36.7 C) 98.1 F (36.7 C) 98.4 F (36.9 C) 99.1 F (37.3 C)  TempSrc: Oral Oral  Oral  SpO2: 97% 94% 94% 97%  Weight:  103.7 kg    Height:         Intake/Output Summary (Last 24 hours) at 03/09/2020 1138 Last data filed at 03/09/2020 1017 Gross per 24 hour  Intake --  Output 325 ml  Net -325 ml      PHYSICAL EXAM  General: Well developed, well nourished, in no acute distress HEENT:  Normocephalic and atramatic Neck:  No JVD.  Lungs: Clear bilaterally to auscultation and percussion. Heart: HRRR . Normal S1 and S2 without gallops or murmurs.  Abdomen: Bowel sounds are positive, abdomen soft and non-tender  Msk:  Back normal, normal gait. Normal strength and tone for age. Extremities: No clubbing, cyanosis or edema.   Neuro: Alert and oriented X 3. Psych:  Good affect, responds appropriately   LABS: Basic Metabolic Panel: Recent Labs    03/07/20 1227 03/07/20 1807 03/08/20 0543 03/09/20 0439  NA   < >  --  139 136  K   < >  --  4.1 5.2*  CL   < >  --  98 98  CO2   < >  --  32 32  GLUCOSE   < >  --  215* 193*  BUN   < >  --  16 20  CREATININE   < >  --  1.05 1.07  CALCIUM   < >  --  8.7* 8.8*  MG  --  2.2  --   --    < > = values in this interval not displayed.   Liver Function Tests: No results for input(s): AST, ALT, ALKPHOS, BILITOT, PROT, ALBUMIN in the last 72 hours. No results for input(s): LIPASE, AMYLASE in the last 72 hours. CBC: Recent Labs    03/08/20 0543 03/09/20 0439  WBC 7.2 7.6  HGB 16.7 16.6  HCT 51.6 52.5*  MCV 93.6 95.5  PLT 143* 138*   Cardiac Enzymes: No results for input(s): CKTOTAL, CKMB, CKMBINDEX, TROPONINI in the last 72 hours. BNP: Invalid input(s):  POCBNP D-Dimer: No results for input(s): DDIMER in the last 72 hours. Hemoglobin A1C: Recent Labs    03/07/20 1644  HGBA1C 7.9*   Fasting Lipid Panel: Recent Labs    03/08/20 0543  CHOL 170  HDL 28*  LDLCALC UNABLE TO CALCULATE IF TRIGLYCERIDE OVER 400 mg/dL  TRIG 514*  CHOLHDL 6.1  LDLDIRECT 84.1   Thyroid Function Tests: No results for input(s): TSH, T4TOTAL, T3FREE, THYROIDAB in the last 72 hours.  Invalid input(s): FREET3 Anemia Panel: No results for input(s): VITAMINB12, FOLATE, FERRITIN, TIBC, IRON, RETICCTPCT in the last 72 hours.  DG Chest 2 View  Result Date: 03/07/2020 CLINICAL DATA:  Pt presents to ED c/o L-sided chest pain 9/10 starting yesterday. Pt states he took 3 NTG yesterday with relief and 3 today, last one at 1200, with no relief today. Extensive cardiac hx, states last cath last year. former smoker quit in 2014 EXAM: CHEST - 2 VIEW COMPARISON:  10/04/2019 FINDINGS: Stable changes from previous cardiac surgery. The cardiac silhouette is normal in size  and configuration. No mediastinal or hilar masses. Linear opacities are noted in the lung bases consistent a combination of prominent bronchovascular markings and scarring or atelectasis. Lungs are hyperexpanded but otherwise clear. No pleural effusion or pneumothorax. Skeletal structures are demineralized but intact. IMPRESSION: 1. No acute cardiopulmonary disease. 2. Hyperexpanded lungs suggesting COPD. Electronically Signed   By: Lajean Manes M.D.   On: 03/07/2020 13:12   ECHOCARDIOGRAM COMPLETE  Result Date: 03/08/2020    ECHOCARDIOGRAM REPORT   Patient Name:   Lawrence [name] [facility] Date of Exam: 03/08/2020 Medical Rec #:  [id number]       Height:       67.0 in Accession #:    [id number]      Weight:       227.0 lb Date of Birth:  Jan 07, 1956       BSA:          2.134 m Patient Age:    64 years        BP:           106/64 mmHg Patient Gender: M               HR:           58 bpm. Exam Location:  [facility] Procedure: 2D Echo,  Cardiac Doppler and Color Doppler Indications:     Chest pain 786.50  History:         Patient has prior history of Echocardiogram examinations, most                  recent 06/11/2019. COPD; Risk Factors:Sleep Apnea and Diabetes.                  MI.  Sonographer:     Sherrie Sport RDCS (AE) Referring Phys:  [place] Diagnosing Phys: Isaias Cowman MD  Sonographer Comments: Suboptimal apical window. IMPRESSIONS  1. Left ventricular ejection fraction, by estimation, is 45 to 50%. The left ventricle has mildly decreased function. The left ventricle has no regional wall motion abnormalities. Left ventricular diastolic parameters are indeterminate.  2. Right ventricular systolic function is normal. The right ventricular size is normal. There is normal pulmonary artery systolic pressure.  3. The mitral valve is normal in structure. Mild mitral valve regurgitation. No evidence of mitral stenosis.  4. The aortic valve is normal in structure. Aortic valve regurgitation is not visualized. No aortic stenosis is present.  5. The inferior vena cava is normal in size with greater than 50% respiratory variability, suggesting right atrial pressure of 3 mmHg. FINDINGS  Left Ventricle: Left ventricular ejection fraction, by estimation, is 45 to 50%. The left ventricle has mildly decreased function. The left ventricle has no regional wall motion abnormalities. The left ventricular internal cavity size was normal in size. There is no left ventricular hypertrophy. Left ventricular diastolic parameters are indeterminate. Right Ventricle: The right ventricular size is normal. No increase in right ventricular wall thickness. Right ventricular systolic function is normal. There is normal pulmonary artery systolic pressure. The tricuspid regurgitant velocity is 1.86 m/s, and  with an assumed right atrial pressure of 10 mmHg, the estimated right ventricular systolic pressure is 30.0 mmHg. Left Atrium: Left atrial size was  normal in size. Right Atrium: Right atrial size was normal in size. Pericardium: There is no evidence of pericardial effusion. Mitral Valve: The mitral valve is normal in structure. Normal mobility of the mitral valve leaflets. Mild mitral valve regurgitation. No evidence of mitral valve stenosis.  Tricuspid Valve: The tricuspid valve is normal in structure. Tricuspid valve regurgitation is mild . No evidence of tricuspid stenosis. Aortic Valve: The aortic valve is normal in structure. Aortic valve regurgitation is not visualized. No aortic stenosis is present. Aortic valve mean gradient measures 2.5 mmHg. Aortic valve peak gradient measures 5.5 mmHg. Aortic valve area, by VTI measures 3.67 cm. Pulmonic Valve: The pulmonic valve was normal in structure. Pulmonic valve regurgitation is not visualized. No evidence of pulmonic stenosis. Aorta: The aortic root is normal in size and structure. Venous: The inferior vena cava is normal in size with greater than 50% respiratory variability, suggesting right atrial pressure of 3 mmHg. IAS/Shunts: No atrial level shunt detected by color flow Doppler.  LEFT VENTRICLE PLAX 2D LVIDd:         3.42 cm LVIDs:         2.55 cm LV PW:         1.04 cm LV IVS:        1.71 cm LVOT diam:     2.20 cm LV SV:         85 LV SV Index:   40 LVOT Area:     3.80 cm  RIGHT VENTRICLE RV Basal diam:  3.45 cm RV S prime:     9.25 cm/s TAPSE (M-mode): 3.0 cm LEFT ATRIUM              Index       RIGHT ATRIUM           Index LA diam:        3.50 cm  1.64 cm/m  RA Area:     22.80 cm LA Vol (A2C):   119.0 ml 55.76 ml/m RA Volume:   71.60 ml  33.55 ml/m LA Vol (A4C):   59.2 ml  27.74 ml/m LA Biplane Vol: 90.1 ml  42.22 ml/m  AORTIC VALVE                   PULMONIC VALVE AV Area (Vmax):    3.69 cm    PV Vmax:        0.67 m/s AV Area (Vmean):   4.09 cm    PV Peak grad:   1.8 mmHg AV Area (VTI):     3.67 cm    RVOT Peak grad: 2 mmHg AV Vmax:           117.50 cm/s AV Vmean:          76.400 cm/s AV  VTI:            0.232 m AV Peak Grad:      5.5 mmHg AV Mean Grad:      2.5 mmHg LVOT Vmax:         114.00 cm/s LVOT Vmean:        82.300 cm/s LVOT VTI:          0.224 m LVOT/AV VTI ratio: 0.97  AORTA Ao Root diam: 3.20 cm TRICUSPID VALVE TR Peak grad:   13.8 mmHg TR Vmax:        186.00 cm/s  SHUNTS Systemic VTI:  0.22 m Systemic Diam: 2.20 cm Isaias Cowman MD Electronically signed by Isaias Cowman MD Signature Date/Time: 03/08/2020/1:07:36 PM    Final      Echo LVEF 45 to 50%  TELEMETRY: Sinus rhythm at 63 bpm:  ASSESSMENT AND PLAN:  Principal Problem:   Chest pain Active Problems:   HTN (hypertension)   Diabetes mellitus with diabetic  nephropathy (HCC)   HLD (hyperlipidemia)   COPD (chronic obstructive pulmonary disease) (HCC)   Chronic diastolic CHF (congestive heart failure) (HCC)   Anxiety   Paroxysmal atrial fibrillation (HCC)   Elevated troponin   Non-STEMI (non-ST elevated myocardial infarction) ([place])    1.  Chest pain, chronic, multiple hospitalizations for CHF and chest pain, with typical and atypical features, difficult to assess, mildly elevated high-sensitivity troponin (152, 149, 145, 133, 117), without significant delta, trending downwards, with nondiagnostic ECG. 2.  Coronary artery disease, status post anterior STEMI, rescue CABG x2, known atretic LIMA to LAD, status post multiple stents SVG to OM1, recent cardiac catheterization 06/12/2019 revealed atretic LIMA to LAD, patent SVG to OM1, patent stents proximal LAD and proximal left circumflex. 3.  Paroxysmal atrial fibrillation, on Eliquis for stroke prevention, and sotalol for rhythm control.  Eliquis being held for scheduled cardiac catheterization on 4//26/2021.  Recommendations  1.  Agree with overall current therapy 2.  Continue heparin drip 3.  Hold Eliquis 4.  Cardiac catheterization with selective coronary arteriography on 03/11/2020.  The risk, benefits and alternatives of cardiac catheterization,  and possible PCI were explained to the patient and informed written consent was obtained.   Isaias Cowman, MD, PhD, [facility] 03/09/2020 11:38 AM

## 2020-03-10 DIAGNOSIS — E1121 Type 2 diabetes mellitus with diabetic nephropathy: Secondary | ICD-10-CM

## 2020-03-10 DIAGNOSIS — I10 Essential (primary) hypertension: Secondary | ICD-10-CM

## 2020-03-10 LAB — BASIC METABOLIC PANEL
Anion gap: 5 (ref 5–15)
BUN: 14 mg/dL (ref 8–23)
CO2: 33 mmol/L — ABNORMAL HIGH (ref 22–32)
Calcium: 8.5 mg/dL — ABNORMAL LOW (ref 8.9–10.3)
Chloride: 100 mmol/L (ref 98–111)
Creatinine, Ser: 0.92 mg/dL (ref 0.61–1.24)
GFR calc Af Amer: >60 mL/min (ref >60)
GFR calc non Af Amer: >60 mL/min (ref >60)
Glucose, Bld: 165 mg/dL — ABNORMAL HIGH (ref 70–99)
Potassium: 3.8 mmol/L (ref 3.5–5.1)
Sodium: 138 mmol/L (ref 135–145)

## 2020-03-10 LAB — GLUCOSE, CAPILLARY
Glucose-Capillary: 154 mg/dL — ABNORMAL HIGH (ref 70–99)
Glucose-Capillary: 154 mg/dL — ABNORMAL HIGH (ref 70–99)
Glucose-Capillary: 130 mg/dL — ABNORMAL HIGH (ref 70–99)
Glucose-Capillary: 173 mg/dL — ABNORMAL HIGH (ref 70–99)

## 2020-03-10 LAB — HEPARIN LEVEL (UNFRACTIONATED)
Heparin Unfractionated: 0.24 IU/mL — ABNORMAL LOW (ref 0.30–0.70)
Heparin Unfractionated: 0.22 IU/mL — ABNORMAL LOW (ref 0.30–0.70)
Heparin Unfractionated: 0.33 IU/mL (ref 0.30–0.70)

## 2020-03-10 MED ORDER — SODIUM CHLORIDE 0.9% FLUSH
3.0000 mL | Freq: Two times a day (BID) | INTRAVENOUS | Status: DC
  Administered 2020-03-10 – 2020-03-11 (×3): 3 mL via INTRAVENOUS

## 2020-03-10 MED ORDER — SODIUM CHLORIDE 0.9% FLUSH: 3.0000 mL | INTRAVENOUS | Status: DC | PRN

## 2020-03-10 MED ORDER — SODIUM CHLORIDE 0.9 % WEIGHT BASED INFUSION
3.0000 mL/kg/h | INTRAVENOUS | Status: AC
  Administered 2020-03-10: 3 mL/kg/h via INTRAVENOUS

## 2020-03-10 MED ORDER — SODIUM CHLORIDE 0.9 % WEIGHT BASED INFUSION
1.0000 mL/kg/h | INTRAVENOUS | Status: DC
  Administered 2020-03-10 – 2020-03-11 (×2): 1 mL/kg/h via INTRAVENOUS

## 2020-03-10 MED ORDER — SODIUM CHLORIDE 0.9 % IV SOLN: 250.0000 mL | INTRAVENOUS | Status: DC | PRN

## 2020-03-10 MED ORDER — HEPARIN BOLUS VIA INFUSION
1300.0000 [IU] | Freq: Once | INTRAVENOUS | Status: AC
  Administered 2020-03-10: 1300 [IU] via INTRAVENOUS
  Filled 2020-03-10: qty 1300

## 2020-03-10 MED ORDER — ASPIRIN 81 MG PO CHEW
81.0000 mg | CHEWABLE_TABLET | ORAL | Status: AC
  Administered 2020-03-11: 81 mg via ORAL
  Filled 2020-03-10: qty 1

## 2020-03-10 NOTE — Consult Note (Signed)
[place] for Heparin Indication: chest pain/ACS  Allergies  Allergen Reactions  . Demerol  [Meperidine Hcl]   . Demerol [Meperidine] Hives  . Jardiance [Empagliflozin] Other (See Comments)    Perineal pain  . Prednisone Other (See Comments) and Hypertension    Pt states that this medication puts him in A-fib   . Sulfa Antibiotics Hives  . Albuterol Sulfate [Albuterol] Palpitations and Other (See Comments)    Pt currently uses this medication.    . Morphine Sulfate Nausea And Vomiting, Rash and Other (See Comments)    Pt states that he is only allergic to the tablet form of this medication.      Patient Measurements: Height: 5' 7"  (170.2 cm) Weight: 103 kg (227 lb) IBW/kg (Calculated) : 66.1 Heparin Dosing Weight: 88.7 kg  Vital Signs: Temp: 98.4 F (36.9 C) (04/25 1232) BP: 130/61 (04/25 1232) Pulse Rate: 64 (04/25 1232)  Labs: Recent Labs    03/07/20 1807 03/07/20 2019 03/07/20 2223 03/07/20 2257 03/08/20 0543 03/08/20 1221 03/09/20 0439 03/10/20 0513 03/10/20 1358  HGB  --   --   --   --  16.7  --  16.6  --   --   HCT  --   --   --   --  51.6  --  52.5*  --   --   PLT  --   --   --   --  143*  --  138*  --   --   APTT  --   --  102*  --  85*  --   --   --   --   HEPARINUNFRC  --   --   --   --  0.68   < > 0.40 0.24* 0.22*  CREATININE  --   --   --   --  1.05  --  1.07 0.92  --   TROPONINIHS 145* 133*  --  117*  --   --   --   --   --    < > = values in this interval not displayed.    Estimated Creatinine Clearance: 90.4 mL/min (by C-G formula based on SCr of 0.92 mg/dL).   Medical History: Past Medical History:  Diagnosis Date  . A-fib ([place])   . Anemia   . Anginal pain ([place])   . Anxiety   . Arthritis   . Asthma   . CAD (coronary artery disease)    a. 2002 CABGx2 (LIMA->LAD, VG->VG->OM1);  b. 09/2012 DES->OM;  c. 03/2015 PTCA of LAD [facility]) in setting of atretic LIMA; d. 05/2015 Cath [facility]): nonobs dzs; e. 06/2015  Cath ([facility]): LM nl, LAD 45p/d ISR, 50d, D1/2 small, LCX 50p/d ISR, OM1 70ost, 30 ISR, VG->OM1 50ost, 38m LIMA->LAD 99p/d - atretic, RCA dom, nl; f.cath 10/16: 40-50%(FFR 0.90) pLAD, 75% (FFR 0.77) mLAD s/p PCI/DES, oRCA 40% (FFR0.95)  . Cancer ([place]    SKIN CANCER ON BACK  . Celiac disease   . Chronic diastolic CHF (congestive heart failure) ([place]    a. 06/2009 Echo: EF 60-65%, Gr 1 DD, triv AI, mildly dil LA, nl RV.  .Marland KitchenCOPD (chronic obstructive pulmonary disease) ([place]    a. Chronic bronchitis and emphysema.  . DDD (degenerative disc disease), lumbar   . Diverticulosis   . Dysrhythmia   . Essential hypertension   . GERD (gastroesophageal reflux disease)   . History of hiatal hernia   . History of kidney stones  H/O  . History of tobacco abuse    a. Quit 2014.  Marland Kitchen Myocardial infarction ([place]) 2002   4 STENTS  . Pancreatitis   . PSVT (paroxysmal supraventricular tachycardia) ([place])    a. 10/2012 Noted on Zio Patch.  . Sleep apnea    LOST CORD TO CPAP -ONLY 02 @ BEDTIME  . Tubular adenoma of colon   . Type II diabetes mellitus (HCC)     Medications:  Medications Prior to Admission  Medication Sig Dispense Refill Last Dose  . Albuterol Sulfate, sensor, 108 (90 Base) MCG/ACT AEPB Inhale 2 puffs into the lungs at bedtime. AND PRN   PRN  . allopurinol (ZYLOPRIM) 300 MG tablet TAKE 1 TABLET BY MOUTH TWICE A DAY 180 tablet 2 03/07/2020 at 1100  . ALPRAZolam (XANAX) 1 MG tablet Take 1 tablet (1 mg total) by mouth 3 (three) times daily. 90 tablet 4 03/07/2020 at 1100  . aspirin 81 MG chewable tablet Chew 81 mg by mouth daily.   03/07/2020 at 1100  . atorvastatin (LIPITOR) 80 MG tablet TAKE ONE TABLET BY MOUTH AT BEDTIME 90 tablet 1 03/06/2020 at Unknown time  . BREO ELLIPTA 100-25 MCG/INH AEPB INHALE 1 PUFF INTO THE LUNGS DAILY 60 each 5 03/07/2020 at 1100  . budesonide (PULMICORT) 0.5 MG/2ML nebulizer solution Take 0.5 mg by nebulization 2 (two) times daily.   PRN at PRN  . cetirizine  (ZYRTEC) 10 MG tablet TAKE ONE TABLET BY MOUTH AT BEDTIME 30 tablet 6 03/06/2020 at Unknown time  . Cholecalciferol (VITAMIN D3) 125 MCG (5000 UT) CAPS Take 5,000 Units by mouth daily.   03/07/2020 at Unknown time  . dapagliflozin propanediol (FARXIGA) 10 MG TABS tablet Take 10 mg by mouth daily.   03/07/2020 at 11:00  . docusate sodium (COLACE) 100 MG capsule Take 100 mg by mouth daily.     at 11:00  . ELIQUIS 5 MG TABS tablet TAKE 1 TABLET BY MOUTH TWICE A DAY 180 tablet 0 03/07/2020 at 1100  . isosorbide mononitrate (IMDUR) 120 MG 24 hr tablet Take 1 tablet (120 mg total) by mouth daily. 30 tablet 5 03/07/2020 at 0900  . LINZESS 72 MCG capsule Take 72 mcg by mouth daily before breakfast.    03/07/2020 at 1100  . lisinopril (ZESTRIL) 10 MG tablet Take 10 mg by mouth daily.   03/07/2020 at 1100  . magnesium oxide (MAG-OX) 400 (241.3 Mg) MG tablet Take 1 tablet (400 mg total) by mouth every morning. 30 tablet 5 03/07/2020 at 1100  . methocarbamol (ROBAXIN) 500 MG tablet TAKE 1 TABLET BY MOUTH 4 TIMES DAILY 60 tablet 3 03/07/2020 at 1100  . nitroGLYCERIN (NITROSTAT) 0.4 MG SL tablet Place 1 tablet (0.4 mg total) under the tongue every 5 (five) minutes as needed for chest pain. Reported on 05/26/2016 30 tablet 3 PRN at PRN  . omega-3 acid ethyl esters (LOVAZA) 1 g capsule Take 4 g by mouth daily.   03/07/2020 at 1100  . oxyCODONE-acetaminophen (PERCOCET) 10-325 MG tablet TAKE 1 TABLET BY MOUTH EVERY 4 HOURS 150 tablet 0 03/07/2020 at 1000  . ranolazine (RANEXA) 1000 MG SR tablet Take 1,000 mg by mouth 2 (two) times daily.   03/07/2020 at Unknown time  . sotalol (BETAPACE) 80 MG tablet Take 80 mg by mouth 2 (two) times daily.   03/07/2020 at 1100  . tiotropium (SPIRIVA) 18 MCG inhalation capsule Place 18 mcg into inhaler and inhale daily.   03/07/2020 at 1100  . torsemide (DEMADEX)  100 MG tablet Take 50 mg by mouth daily.    PRN at PRN  . traZODone (DESYREL) 100 MG tablet Take 100 mg by mouth at bedtime.   03/06/2020  at 2130  . venlafaxine XR (EFFEXOR-XR) 75 MG 24 hr capsule Take 75 mg by mouth daily with breakfast.   Not Taking at Unknown time   Scheduled:  . allopurinol  300 mg Oral BID  . ALPRAZolam  1 mg Oral TID  . aspirin  81 mg Oral Daily  . atorvastatin  80 mg Oral QHS  . budesonide  0.5 mg Nebulization BID  . cholecalciferol  5,000 Units Oral Daily  . docusate sodium  100 mg Oral Daily  . fluticasone furoate-vilanterol  1 puff Inhalation Daily  . insulin aspart  0-5 Units Subcutaneous QHS  . insulin aspart  0-9 Units Subcutaneous TID WC  . insulin glargine  10 Units Subcutaneous Daily  . isosorbide mononitrate  120 mg Oral Daily  . linaclotide  72 mcg Oral QAC breakfast  . lisinopril  10 mg Oral Daily  . loratadine  10 mg Oral Daily  . magnesium oxide  400 mg Oral [id number]  . methocarbamol  500 mg Oral QID  . omega-3 acid ethyl esters  4 g Oral Daily  . ranolazine  1,000 mg Oral BID  . sodium chloride flush  3 mL Intravenous Q12H  . sotalol  80 mg Oral Q12H  . tiotropium  18 mcg Inhalation Daily  . traZODone  100 mg Oral QHS   Infusions:  . heparin 1,200 Units/hr (03/10/20 1110)   PRN:  Anti-infectives (From admission, onward)   None      Assessment: Pharmacy consulted to start heparin for NSTEMI. Pt is on apixaban PTA for afib. hgb and plt stable. Will order baseline aPTT, HL, and INR. Will monitor with aPTT levels until heparin level and aPTT correlate.   04/23 @ 0543 HL 0.68. Therapeutic. 04/23 @ 0543 aPTT 85.  Therapeutic. 04/23 @ 1243 HL 0.53, Therapeutic. 04/25 @ 0500 HL 0.24 subtherapeutic. Will increase rate to 1200 units/hr    Goal of Therapy:  Heparin level 0.3-0.7 units/ml Monitor platelets by anticoagulation protocol: Yes   Plan:  4/25 @1358  HL 0.22. Subtherapeutic.Will order Heparin bolus of 1300 units x 1 and increase drip to 1400 units/hr. Will recheck HL in 6 hrs.  Noralee Space, PharmD 03/10/2020,2:44 PM

## 2020-03-10 NOTE — Consult Note (Signed)
[place] for Heparin Indication: chest pain/ACS  Allergies  Allergen Reactions  . Demerol  [Meperidine Hcl]   . Demerol [Meperidine] Hives  . Jardiance [Empagliflozin] Other (See Comments)    Perineal pain  . Prednisone Other (See Comments) and Hypertension    Pt states that this medication puts him in A-fib   . Sulfa Antibiotics Hives  . Albuterol Sulfate [Albuterol] Palpitations and Other (See Comments)    Pt currently uses this medication.    . Morphine Sulfate Nausea And Vomiting, Rash and Other (See Comments)    Pt states that he is only allergic to the tablet form of this medication.      Patient Measurements: Height: 5' 7"  (170.2 cm) Weight: 103 kg (227 lb) IBW/kg (Calculated) : 66.1 Heparin Dosing Weight: 88.7 kg  Vital Signs: Temp: 98 F (36.7 C) (04/25 0355) Temp Source: Oral (04/24 1938) BP: 95/52 (04/25 0355) Pulse Rate: 61 (04/25 0400)  Labs: Recent Labs    03/07/20 1227 03/07/20 1227 03/07/20 1408 03/07/20 1408 03/07/20 1807 03/07/20 2019 03/07/20 2223 03/07/20 2257 03/08/20 0543 03/08/20 0543 03/08/20 1221 03/09/20 0439 03/10/20 0513  HGB 17.7*  --   --    < >  --   --   --   --  16.7  --   --  16.6  --   HCT 55.4*  --   --   --   --   --   --   --  51.6  --   --  52.5*  --   PLT 150  --   --   --   --   --   --   --  143*  --   --  138*  --   APTT  --   --  32  --   --   --  102*  --  85*  --   --   --   --   LABPROT  --   --  13.1  --   --   --   --   --   --   --   --   --   --   INR  --   --  1.0  --   --   --   --   --   --   --   --   --   --   HEPARINUNFRC  --   --  1.08*   < >  --   --   --   --  0.68   < > 0.53 0.40 0.24*  CREATININE 1.01  --   --    < >  --   --   --   --  1.05  --   --  1.07 0.92  TROPONINIHS 152*   < > 149*   < > 145* 133*  --  117*  --   --   --   --   --    < > = values in this interval not displayed.    Estimated Creatinine Clearance: 90.4 mL/min (by C-G formula based on  SCr of 0.92 mg/dL).   Medical History: Past Medical History:  Diagnosis Date  . A-fib ([place])   . Anemia   . Anginal pain ([place])   . Anxiety   . Arthritis   . Asthma   . CAD (coronary artery disease)    a. 2002 CABGx2 (LIMA->LAD, VG->VG->OM1);  b.  09/2012 DES->OM;  c. 03/2015 PTCA of LAD [facility]) in setting of atretic LIMA; d. 05/2015 Cath [facility]): nonobs dzs; e. 06/2015 Cath ([facility]): LM nl, LAD 45p/d ISR, 50d, D1/2 small, LCX 50p/d ISR, OM1 70ost, 30 ISR, VG->OM1 50ost, 28m LIMA->LAD 99p/d - atretic, RCA dom, nl; f.cath 10/16: 40-50%(FFR 0.90) pLAD, 75% (FFR 0.77) mLAD s/p PCI/DES, oRCA 40% (FFR0.95)  . Cancer ([place]    SKIN CANCER ON BACK  . Celiac disease   . Chronic diastolic CHF (congestive heart failure) ([place]    a. 06/2009 Echo: EF 60-65%, Gr 1 DD, triv AI, mildly dil LA, nl RV.  .Marland KitchenCOPD (chronic obstructive pulmonary disease) ([place]    a. Chronic bronchitis and emphysema.  . DDD (degenerative disc disease), lumbar   . Diverticulosis   . Dysrhythmia   . Essential hypertension   . GERD (gastroesophageal reflux disease)   . History of hiatal hernia   . History of kidney stones    H/O  . History of tobacco abuse    a. Quit 2014.  .Marland KitchenMyocardial infarction ([place] 2002   4 STENTS  . Pancreatitis   . PSVT (paroxysmal supraventricular tachycardia) ([place]    a. 10/2012 Noted on Zio Patch.  . Sleep apnea    LOST CORD TO CPAP -ONLY 02 @ BEDTIME  . Tubular adenoma of colon   . Type II diabetes mellitus (HCC)     Medications:  Medications Prior to Admission  Medication Sig Dispense Refill Last Dose  . Albuterol Sulfate, sensor, 108 (90 Base) MCG/ACT AEPB Inhale 2 puffs into the lungs at bedtime. AND PRN   PRN  . allopurinol (ZYLOPRIM) 300 MG tablet TAKE 1 TABLET BY MOUTH TWICE A DAY 180 tablet 2 03/07/2020 at 1100  . ALPRAZolam (XANAX) 1 MG tablet Take 1 tablet (1 mg total) by mouth 3 (three) times daily. 90 tablet 4 03/07/2020 at 1100  . aspirin 81 MG chewable tablet Chew 81 mg by mouth daily.    03/07/2020 at 1100  . atorvastatin (LIPITOR) 80 MG tablet TAKE ONE TABLET BY MOUTH AT BEDTIME 90 tablet 1 03/06/2020 at Unknown time  . BREO ELLIPTA 100-25 MCG/INH AEPB INHALE 1 PUFF INTO THE LUNGS DAILY 60 each 5 03/07/2020 at 1100  . budesonide (PULMICORT) 0.5 MG/2ML nebulizer solution Take 0.5 mg by nebulization 2 (two) times daily.   PRN at PRN  . cetirizine (ZYRTEC) 10 MG tablet TAKE ONE TABLET BY MOUTH AT BEDTIME 30 tablet 6 03/06/2020 at Unknown time  . Cholecalciferol (VITAMIN D3) 125 MCG (5000 UT) CAPS Take 5,000 Units by mouth daily.   03/07/2020 at Unknown time  . dapagliflozin propanediol (FARXIGA) 10 MG TABS tablet Take 10 mg by mouth daily.   03/07/2020 at 11:00  . docusate sodium (COLACE) 100 MG capsule Take 100 mg by mouth daily.     at 11:00  . ELIQUIS 5 MG TABS tablet TAKE 1 TABLET BY MOUTH TWICE A DAY 180 tablet 0 03/07/2020 at 1100  . isosorbide mononitrate (IMDUR) 120 MG 24 hr tablet Take 1 tablet (120 mg total) by mouth daily. 30 tablet 5 03/07/2020 at 0900  . LINZESS 72 MCG capsule Take 72 mcg by mouth daily before breakfast.    03/07/2020 at 1100  . lisinopril (ZESTRIL) 10 MG tablet Take 10 mg by mouth daily.   03/07/2020 at 1100  . magnesium oxide (MAG-OX) 400 (241.3 Mg) MG tablet Take 1 tablet (400 mg total) by mouth every morning. 30 tablet 5 03/07/2020 at 1100  .  methocarbamol (ROBAXIN) 500 MG tablet TAKE 1 TABLET BY MOUTH 4 TIMES DAILY 60 tablet 3 03/07/2020 at 1100  . nitroGLYCERIN (NITROSTAT) 0.4 MG SL tablet Place 1 tablet (0.4 mg total) under the tongue every 5 (five) minutes as needed for chest pain. Reported on 05/26/2016 30 tablet 3 PRN at PRN  . omega-3 acid ethyl esters (LOVAZA) 1 g capsule Take 4 g by mouth daily.   03/07/2020 at 1100  . oxyCODONE-acetaminophen (PERCOCET) 10-325 MG tablet TAKE 1 TABLET BY MOUTH EVERY 4 HOURS 150 tablet 0 03/07/2020 at 1000  . ranolazine (RANEXA) 1000 MG SR tablet Take 1,000 mg by mouth 2 (two) times daily.   03/07/2020 at Unknown time  .  sotalol (BETAPACE) 80 MG tablet Take 80 mg by mouth 2 (two) times daily.   03/07/2020 at 1100  . tiotropium (SPIRIVA) 18 MCG inhalation capsule Place 18 mcg into inhaler and inhale daily.   03/07/2020 at 1100  . torsemide (DEMADEX) 100 MG tablet Take 50 mg by mouth daily.    PRN at PRN  . traZODone (DESYREL) 100 MG tablet Take 100 mg by mouth at bedtime.   03/06/2020 at 2130  . venlafaxine XR (EFFEXOR-XR) 75 MG 24 hr capsule Take 75 mg by mouth daily with breakfast.   Not Taking at Unknown time   Scheduled:  . allopurinol  300 mg Oral BID  . ALPRAZolam  1 mg Oral TID  . aspirin  81 mg Oral Daily  . atorvastatin  80 mg Oral QHS  . budesonide  0.5 mg Nebulization BID  . cholecalciferol  5,000 Units Oral Daily  . docusate sodium  100 mg Oral Daily  . fluticasone furoate-vilanterol  1 puff Inhalation Daily  . insulin aspart  0-5 Units Subcutaneous QHS  . insulin aspart  0-9 Units Subcutaneous TID WC  . insulin glargine  10 Units Subcutaneous Daily  . isosorbide mononitrate  120 mg Oral Daily  . linaclotide  72 mcg Oral QAC breakfast  . lisinopril  10 mg Oral Daily  . loratadine  10 mg Oral Daily  . magnesium oxide  400 mg Oral BH-q7a  . methocarbamol  500 mg Oral QID  . omega-3 acid ethyl esters  4 g Oral Daily  . ranolazine  1,000 mg Oral BID  . sotalol  80 mg Oral Q12H  . tiotropium  18 mcg Inhalation Daily  . traZODone  100 mg Oral QHS   Infusions:  . heparin 1,000 Units/hr (03/09/20 1847)   PRN:  Anti-infectives (From admission, onward)   None      Assessment: Pharmacy consulted to start heparin for NSTEMI. Pt is on apixaban PTA for afib. hgb and plt stable. Will order baseline aPTT, HL, and INR. Will monitor with aPTT levels until heparin level and aPTT correlate.   04/23 @ 0543 HL 0.68. Therapeutic. 04/23 @ 0543 aPTT 85.  Therapeutic. 04/23 @ 1243 HL 0.53, Therapeutic.   Goal of Therapy:  Heparin level 0.3-0.7 units/ml Monitor platelets by anticoagulation protocol:  Yes   Plan:  04/25 @ 0500 HL 0.24 subtherapeutic. Will increase rate to 1200 units/hr and will recheck HL at 1400, CBC stable will continue to monitor.  Tobie Lords, PharmD 03/10/2020,6:48 AM

## 2020-03-10 NOTE — Progress Notes (Signed)
PROGRESS NOTE    Lawrence [name]  XFG:[id number] DOB: [birth year] DOA: 03/07/2020 PCP: Birdie Sons, MD (Confirm with patient/family/NH records and if not entered, this HAS to be entered at [facility] point of entry. "No PCP" if truly none.)   Brief Narrative: (Start on day 1 of progress note - keep it brief and live) 64 year old male with history of essential hypertension, dyslipidemia, type 2 diabetes, COPD, asthma, off of track sleep apnea, history of pancreatitis, chronic diastolic congestive heart failure, coronary disease with history of CABG, atrial fibrillation on Eliquis. Patient came to the hospital complaint chest pain started 2 days ago. He had a peak troponin was 152, dropped down to 117.  Discussed with cardiology, patient last heart cath did not show significant occlusion.  Most likely small vessel disease.  Scheduled repeat coronary angiogram on Monday.   Assessment & Plan:   Principal Problem:   Chest pain Active Problems:   HTN (hypertension)   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HCC)   HLD (hyperlipidemia)   COPD (chronic obstructive pulmonary disease) (HCC)   Chronic diastolic CHF (congestive heart failure) (HCC)   Anxiety   Paroxysmal atrial fibrillation (HCC)   Elevated troponin   Non-STEMI (non-ST elevated myocardial infarction) ([place])  #1.  Non-STEMI. Patient had previously had a multiple coronary angiograms.  Suspect small vessel disease.  Scheduled coronary angiogram tomorrow.  Continue heparin drip while holding Eliquis.  #2.  Paroxysmal atrial fibrillation. Continue heparin drip.  Heart rate under control.  3.  COPD with chronic hypoxemic respiratory failure. Condition stable.  4.  Type 2 diabetes with hyperglycemia. Started Lantus in the hospital, need to assess if patient need it as outpatient.  5.  Essential hypertension. Continue home medicine including lisinopril.  6.  Mild hyperkalemia.  Potassium is normal after Kayexalate.  Recheck  a BMP tomorrow.  If recurrent hyperkalemia, may discontinue lisinopril.   DVT prophylaxis: Heparin drip Code Status: Full Family Communication: Plan discussed with patient, all questions answered.  Disposition Plan:   Patient came from: Home                                                                                                                           Anticipated d/c place:Home  Barriers to d/c OR conditions which need to be met to effect a safe d/c:   Consultants:   Cardiology  Procedures: None Antimicrobials: None    Subjective: Patient still complaining of chest pain.  No shortness of breath. No nausea vomiting or diarrhea.  No abdominal pain. No dysuria or hematuria.  Objective: Vitals:   03/10/20 0355 03/10/20 0400 03/10/20 0729 03/10/20 1232  BP: (!) 95/52  109/72 130/61  Pulse: (!) 50 61 (!) 54 64  Resp: 18  17 18   Temp: 98 F (36.7 C)  98.4 F (36.9 C) 98.4 F (36.9 C)  TempSrc:      SpO2: 96%  96% 97%  Weight: 103 kg     Height:  Intake/Output Summary (Last 24 hours) at 03/10/2020 1327 Last data filed at 03/10/2020 1312 Gross per 24 hour  Intake 1546.18 ml  Output 2575 ml  Net -1028.82 ml   Filed Weights   03/07/20 1224 03/09/20 0356 03/10/20 0355  Weight: 103 kg 103.7 kg 103 kg    Examination:  General exam: Appears calm and comfortable  Respiratory system: Clear to auscultation. Respiratory effort normal. Cardiovascular system: Irregular. No JVD, murmurs, rubs, gallops or clicks. No pedal edema. Gastrointestinal system: Abdomen is nondistended, soft and nontender. No organomegaly or masses felt. Normal bowel sounds heard. Central nervous system: Alert and oriented. No focal neurological deficits. Extremities: Symmetric 5 x 5 power. Skin: No rashes, lesions or ulcers Psychiatry: Judgement and insight appear normal. Mood & affect appropriate.     Data Reviewed: I have personally reviewed following labs and imaging  studies  CBC: Recent Labs  Lab 03/07/20 1227 03/08/20 0543 03/09/20 0439  WBC 6.2 7.2 7.6  HGB 17.7* 16.7 16.6  HCT 55.4* 51.6 52.5*  MCV 93.9 93.6 95.5  PLT 150 143* 627*   Basic Metabolic Panel: Recent Labs  Lab 03/07/20 1227 03/07/20 1807 03/08/20 0543 03/09/20 0439 03/09/20 1435 03/10/20 0513  NA 137  --  139 136  --  138  K 4.3  --  4.1 5.2* 4.0 3.8  CL 98  --  98 98  --  100  CO2 30  --  32 32  --  33*  GLUCOSE 307*  --  215* 193*  --  165*  BUN 12  --  16 20  --  14  CREATININE 1.01  --  1.05 1.07  --  0.92  CALCIUM 9.2  --  8.7* 8.8*  --  8.5*  MG  --  2.2  --   --   --   --    GFR: Estimated Creatinine Clearance: 90.4 mL/min (by C-G formula based on SCr of 0.92 mg/dL). Liver Function Tests: No results for input(s): AST, ALT, ALKPHOS, BILITOT, PROT, ALBUMIN in the last 168 hours. No results for input(s): LIPASE, AMYLASE in the last 168 hours. No results for input(s): AMMONIA in the last 168 hours. Coagulation Profile: Recent Labs  Lab 03/07/20 1408  INR 1.0   Cardiac Enzymes: No results for input(s): CKTOTAL, CKMB, CKMBINDEX, TROPONINI in the last 168 hours. BNP (last 3 results) No results for input(s): PROBNP in the last 8760 hours. HbA1C: Recent Labs    03/07/20 1644  HGBA1C 7.9*   CBG: Recent Labs  Lab 03/09/20 1109 03/09/20 1651 03/09/20 2156 03/10/20 0731 03/10/20 1233  GLUCAP 161* 140* 127* 154* 154*   Lipid Profile: Recent Labs    03/08/20 0543  CHOL 170  HDL 28*  LDLCALC UNABLE TO CALCULATE IF TRIGLYCERIDE OVER 400 mg/dL  TRIG 514*  CHOLHDL 6.1  LDLDIRECT 84.1   Thyroid Function Tests: No results for input(s): TSH, T4TOTAL, FREET4, T3FREE, THYROIDAB in the last 72 hours. Anemia Panel: No results for input(s): VITAMINB12, FOLATE, FERRITIN, TIBC, IRON, RETICCTPCT in the last 72 hours. Sepsis Labs: No results for input(s): PROCALCITON, LATICACIDVEN in the last 168 hours.  Recent Results (from the past 240 hour(s))    Respiratory Panel by RT PCR (Flu A&B, Covid) - Nasopharyngeal Swab     Status: None   Collection Time: 03/07/20  4:44 PM   Specimen: Nasopharyngeal Swab  Result Value Ref Range Status   SARS Coronavirus 2 by RT PCR NEGATIVE NEGATIVE Final    Comment: (NOTE) SARS-CoV-2  target nucleic acids are NOT DETECTED. The SARS-CoV-2 RNA is generally detectable in upper respiratoy specimens during the acute phase of infection. The lowest concentration of SARS-CoV-2 viral copies this assay can detect is 131 copies/mL. A negative result does not preclude SARS-Cov-2 infection and should not be used as the sole basis for treatment or other patient management decisions. A negative result may occur with  improper specimen collection/handling, submission of specimen other than nasopharyngeal swab, presence of viral mutation(s) within the areas targeted by this assay, and inadequate number of viral copies (<131 copies/mL). A negative result must be combined with clinical observations, patient history, and epidemiological information. The expected result is Negative. Fact Sheet for Patients:  PinkCheek.be Fact Sheet for Healthcare Providers:  GravelBags.it This test is not yet ap proved or cleared by the [place] FDA and  has been authorized for detection and/or diagnosis of SARS-CoV-2 by FDA under an Emergency Use Authorization (EUA). This EUA will remain  in effect (meaning this test can be used) for the duration of the COVID-19 declaration under Section 564(b)(1) of the Act, 21 U.S.C. section 360bbb-3(b)(1), unless the authorization is terminated or revoked sooner.    Influenza A by PCR NEGATIVE NEGATIVE Final   Influenza B by PCR NEGATIVE NEGATIVE Final    Comment: (NOTE) The Xpert Xpress SARS-CoV-2/FLU/RSV assay is intended as an aid in  the diagnosis of influenza from Nasopharyngeal swab specimens and  should not be used as a sole  basis for treatment. Nasal washings and  aspirates are unacceptable for Xpert Xpress SARS-CoV-2/FLU/RSV  testing. Fact Sheet for Patients: PinkCheek.be Fact Sheet for Healthcare Providers: GravelBags.it This test is not yet approved or cleared by the [place] FDA and  has been authorized for detection and/or diagnosis of SARS-CoV-2 by  FDA under an Emergency Use Authorization (EUA). This EUA will remain  in effect (meaning this test can be used) for the duration of the  Covid-19 declaration under Section 564(b)(1) of the Act, 21  U.S.C. section 360bbb-3(b)(1), unless the authorization is  terminated or revoked. Performed at [facility], [place], [place] [address]   C Difficile Quick Screen w PCR reflex     Status: None   Collection Time: 03/08/20  3:25 PM   Specimen: STOOL  Result Value Ref Range Status   C Diff antigen NEGATIVE NEGATIVE Final   C Diff toxin NEGATIVE NEGATIVE Final   C Diff interpretation No C. difficile detected.  Final    Comment: Performed at [facility], [address], [place], [place] [address]         Radiology Studies: No results found.      Scheduled Meds: . allopurinol  300 mg Oral BID  . ALPRAZolam  1 mg Oral TID  . aspirin  81 mg Oral Daily  . atorvastatin  80 mg Oral QHS  . budesonide  0.5 mg Nebulization BID  . cholecalciferol  5,000 Units Oral Daily  . docusate sodium  100 mg Oral Daily  . fluticasone furoate-vilanterol  1 puff Inhalation Daily  . insulin aspart  0-5 Units Subcutaneous QHS  . insulin aspart  0-9 Units Subcutaneous TID WC  . insulin glargine  10 Units Subcutaneous Daily  . isosorbide mononitrate  120 mg Oral Daily  . linaclotide  72 mcg Oral QAC breakfast  . lisinopril  10 mg Oral Daily  . loratadine  10 mg Oral Daily  . magnesium oxide  400 mg Oral BH-q7a  . methocarbamol  500 mg Oral QID  . omega-3 acid ethyl  esters  4 g Oral Daily  . ranolazine  1,000 mg Oral BID  . sodium chloride flush  3 mL Intravenous Q12H  . sotalol  80 mg Oral Q12H  . tiotropium  18 mcg Inhalation Daily  . traZODone  100 mg Oral QHS   Continuous Infusions: . heparin 1,200 Units/hr (03/10/20 1110)     LOS: 2 days    Time spent: 26 minutes    Sharen Hones, MD [facility]   To contact the attending provider between 7A-7P or the covering provider during after hours 7P-7A, please log into the web site [email or web address] and access using universal [place] password for that web site. If you do not have the password, please call the hospital operator.  03/10/2020, 1:27 PM

## 2020-03-10 NOTE — Progress Notes (Signed)
[facility] Cardiology  SUBJECTIVE: Patient laying in bed, still complaining of intermittent chest discomfort   Vitals:   03/09/20 1938 03/10/20 0355 03/10/20 0400 03/10/20 0729  BP: 123/68 (!) 95/52  109/72  Pulse: 60 (!) 50 61 (!) 54  Resp: 18 18  17   Temp: 98.3 F (36.8 C) 98 F (36.7 C)  98.4 F (36.9 C)  TempSrc: Oral     SpO2: 97% 96%  96%  Weight:  103 kg    Height:         Intake/Output Summary (Last 24 hours) at 03/10/2020 1448 Last data filed at 03/10/2020 1856 Gross per 24 hour  Intake 1014.82 ml  Output 2400 ml  Net -1385.18 ml      PHYSICAL EXAM  General: Well developed, well nourished, in no acute distress HEENT:  Normocephalic and atramatic Neck:  No JVD.  Lungs: Clear bilaterally to auscultation and percussion. Heart: HRRR . Normal S1 and S2 without gallops or murmurs.  Abdomen: Bowel sounds are positive, abdomen soft and non-tender  Msk:  Back normal, normal gait. Normal strength and tone for age. Extremities: No clubbing, cyanosis or edema.   Neuro: Alert and oriented X 3. Psych:  Good affect, responds appropriately   LABS: Basic Metabolic Panel: Recent Labs    03/07/20 1807 03/08/20 0543 03/09/20 0439 03/09/20 0439 03/09/20 1435 03/10/20 0513  NA  --    < > 136  --   --  138  K  --    < > 5.2*   < > 4.0 3.8  CL  --    < > 98  --   --  100  CO2  --    < > 32  --   --  33*  GLUCOSE  --    < > 193*  --   --  165*  BUN  --    < > 20  --   --  14  CREATININE  --    < > 1.07  --   --  0.92  CALCIUM  --    < > 8.8*  --   --  8.5*  MG 2.2  --   --   --   --   --    < > = values in this interval not displayed.   Liver Function Tests: No results for input(s): AST, ALT, ALKPHOS, BILITOT, PROT, ALBUMIN in the last 72 hours. No results for input(s): LIPASE, AMYLASE in the last 72 hours. CBC: Recent Labs    03/08/20 0543 03/09/20 0439  WBC 7.2 7.6  HGB 16.7 16.6  HCT 51.6 52.5*  MCV 93.6 95.5  PLT 143* 138*   Cardiac Enzymes: No results for  input(s): CKTOTAL, CKMB, CKMBINDEX, TROPONINI in the last 72 hours. BNP: Invalid input(s): POCBNP D-Dimer: No results for input(s): DDIMER in the last 72 hours. Hemoglobin A1C: Recent Labs    03/07/20 1644  HGBA1C 7.9*   Fasting Lipid Panel: Recent Labs    03/08/20 0543  CHOL 170  HDL 28*  LDLCALC UNABLE TO CALCULATE IF TRIGLYCERIDE OVER 400 mg/dL  TRIG 514*  CHOLHDL 6.1  LDLDIRECT 84.1   Thyroid Function Tests: No results for input(s): TSH, T4TOTAL, T3FREE, THYROIDAB in the last 72 hours.  Invalid input(s): FREET3 Anemia Panel: No results for input(s): VITAMINB12, FOLATE, FERRITIN, TIBC, IRON, RETICCTPCT in the last 72 hours.  ECHOCARDIOGRAM COMPLETE  Result Date: 03/08/2020    ECHOCARDIOGRAM REPORT   Patient Name:   Lawrence [name] [facility]  Date of Exam: 03/08/2020 Medical Rec #:  [id number]       Height:       67.0 in Accession #:    [id number]      Weight:       227.0 lb Date of Birth:  11/06/1956       BSA:          2.134 m Patient Age:    64 years        BP:           106/64 mmHg Patient Gender: M               HR:           58 bpm. Exam Location:  [facility] Procedure: 2D Echo, Cardiac Doppler and Color Doppler Indications:     Chest pain 786.50  History:         Patient has prior history of Echocardiogram examinations, most                  recent 06/11/2019. COPD; Risk Factors:Sleep Apnea and Diabetes.                  MI.  Sonographer:     Sherrie Sport RDCS (AE) Referring Phys:  [place] Diagnosing Phys: Isaias Cowman MD  Sonographer Comments: Suboptimal apical window. IMPRESSIONS  1. Left ventricular ejection fraction, by estimation, is 45 to 50%. The left ventricle has mildly decreased function. The left ventricle has no regional wall motion abnormalities. Left ventricular diastolic parameters are indeterminate.  2. Right ventricular systolic function is normal. The right ventricular size is normal. There is normal pulmonary artery systolic pressure.  3. The mitral  valve is normal in structure. Mild mitral valve regurgitation. No evidence of mitral stenosis.  4. The aortic valve is normal in structure. Aortic valve regurgitation is not visualized. No aortic stenosis is present.  5. The inferior vena cava is normal in size with greater than 50% respiratory variability, suggesting right atrial pressure of 3 mmHg. FINDINGS  Left Ventricle: Left ventricular ejection fraction, by estimation, is 45 to 50%. The left ventricle has mildly decreased function. The left ventricle has no regional wall motion abnormalities. The left ventricular internal cavity size was normal in size. There is no left ventricular hypertrophy. Left ventricular diastolic parameters are indeterminate. Right Ventricle: The right ventricular size is normal. No increase in right ventricular wall thickness. Right ventricular systolic function is normal. There is normal pulmonary artery systolic pressure. The tricuspid regurgitant velocity is 1.86 m/s, and  with an assumed right atrial pressure of 10 mmHg, the estimated right ventricular systolic pressure is 86.5 mmHg. Left Atrium: Left atrial size was normal in size. Right Atrium: Right atrial size was normal in size. Pericardium: There is no evidence of pericardial effusion. Mitral Valve: The mitral valve is normal in structure. Normal mobility of the mitral valve leaflets. Mild mitral valve regurgitation. No evidence of mitral valve stenosis. Tricuspid Valve: The tricuspid valve is normal in structure. Tricuspid valve regurgitation is mild . No evidence of tricuspid stenosis. Aortic Valve: The aortic valve is normal in structure. Aortic valve regurgitation is not visualized. No aortic stenosis is present. Aortic valve mean gradient measures 2.5 mmHg. Aortic valve peak gradient measures 5.5 mmHg. Aortic valve area, by VTI measures 3.67 cm. Pulmonic Valve: The pulmonic valve was normal in structure. Pulmonic valve regurgitation is not visualized. No evidence of  pulmonic stenosis. Aorta: The aortic root is normal in size  and structure. Venous: The inferior vena cava is normal in size with greater than 50% respiratory variability, suggesting right atrial pressure of 3 mmHg. IAS/Shunts: No atrial level shunt detected by color flow Doppler.  LEFT VENTRICLE PLAX 2D LVIDd:         3.42 cm LVIDs:         2.55 cm LV PW:         1.04 cm LV IVS:        1.71 cm LVOT diam:     2.20 cm LV SV:         85 LV SV Index:   40 LVOT Area:     3.80 cm  RIGHT VENTRICLE RV Basal diam:  3.45 cm RV S prime:     9.25 cm/s TAPSE (M-mode): 3.0 cm LEFT ATRIUM              Index       RIGHT ATRIUM           Index LA diam:        3.50 cm  1.64 cm/m  RA Area:     22.80 cm LA Vol (A2C):   119.0 ml 55.76 ml/m RA Volume:   71.60 ml  33.55 ml/m LA Vol (A4C):   59.2 ml  27.74 ml/m LA Biplane Vol: 90.1 ml  42.22 ml/m  AORTIC VALVE                   PULMONIC VALVE AV Area (Vmax):    3.69 cm    PV Vmax:        0.67 m/s AV Area (Vmean):   4.09 cm    PV Peak grad:   1.8 mmHg AV Area (VTI):     3.67 cm    RVOT Peak grad: 2 mmHg AV Vmax:           117.50 cm/s AV Vmean:          76.400 cm/s AV VTI:            0.232 m AV Peak Grad:      5.5 mmHg AV Mean Grad:      2.5 mmHg LVOT Vmax:         114.00 cm/s LVOT Vmean:        82.300 cm/s LVOT VTI:          0.224 m LVOT/AV VTI ratio: 0.97  AORTA Ao Root diam: 3.20 cm TRICUSPID VALVE TR Peak grad:   13.8 mmHg TR Vmax:        186.00 cm/s  SHUNTS Systemic VTI:  0.22 m Systemic Diam: 2.20 cm Isaias Cowman MD Electronically signed by Isaias Cowman MD Signature Date/Time: 03/08/2020/1:07:36 PM    Final      Echo LVEF 45 to 50%  TELEMETRY: Sinus rhythm at 60 bpm:  ASSESSMENT AND PLAN:  Principal Problem:   Chest pain Active Problems:   HTN (hypertension)   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HCC)   HLD (hyperlipidemia)   COPD (chronic obstructive pulmonary disease) (HCC)   Chronic diastolic CHF (congestive heart failure) (HCC)    Anxiety   Paroxysmal atrial fibrillation (HCC)   Elevated troponin   Non-STEMI (non-ST elevated myocardial infarction) ([place])    1.  Chest pain, multiple hospitalizations for CHF and chest pain, difficult to assess, mildly elevated high-sensitivity troponin (152, 149, 145, 133, 117), without significant delta, with nondiagnostic ECG 2.  Coronary artery disease, status post anterior STEMI, rescue CABG x2, chronic atretic  LIMA to LAD, status post multiple stents SVG to OM1, recent cardiac catheterization 06/12/2019 revealed atretic LIMA to LAD, patent SVG to OM1, patent stents proximal LAD and proximal left circumflex 3.  Paroxysmal atrial fibrillation, on Eliquis for stroke prevention, on sotalol for rhythm control.  Eliquis being held for cardiac catheterization on 03/11/2020.  Recommendations  1.  Continue current therapy 2.  Continue heparin drip 3.  Hold Eliquis 4.  Cardiac catheterization with selective coronary arteriography on 03/11/2020.  The risk, benefits and alternatives of cardiac catheterization and possible PCI were explained to the patient and informed written consent was obtained.   Isaias Cowman, MD, PhD, [facility] 03/10/2020 9:42 AM

## 2020-03-10 NOTE — Consult Note (Signed)
[place] for Heparin Indication: chest pain/ACS  Allergies  Allergen Reactions  . Demerol  [Meperidine Hcl]   . Demerol [Meperidine] Hives  . Jardiance [Empagliflozin] Other (See Comments)    Perineal pain  . Prednisone Other (See Comments) and Hypertension    Pt states that this medication puts him in A-fib   . Sulfa Antibiotics Hives  . Albuterol Sulfate [Albuterol] Palpitations and Other (See Comments)    Pt currently uses this medication.    . Morphine Sulfate Nausea And Vomiting, Rash and Other (See Comments)    Pt states that he is only allergic to the tablet form of this medication.      Patient Measurements: Height: 5' 7"  (170.2 cm) Weight: 103 kg (227 lb) IBW/kg (Calculated) : 66.1 Heparin Dosing Weight: 88.7 kg  Vital Signs: Temp: 97.5 F (36.4 C) (04/25 1940) Temp Source: Oral (04/25 1940) BP: 158/87 (04/25 1940) Pulse Rate: 59 (04/25 1940)  Labs: Recent Labs    03/07/20 2223 03/07/20 2257 03/08/20 0543 03/08/20 1221 03/09/20 0439 03/09/20 0439 03/10/20 0513 03/10/20 1358 03/10/20 2051  HGB  --   --  16.7  --  16.6  --   --   --   --   HCT  --   --  51.6  --  52.5*  --   --   --   --   PLT  --   --  143*  --  138*  --   --   --   --   APTT 102*  --  85*  --   --   --   --   --   --   HEPARINUNFRC  --   --  0.68   < > 0.40   < > 0.24* 0.22* 0.33  CREATININE  --   --  1.05  --  1.07  --  0.92  --   --   TROPONINIHS  --  117*  --   --   --   --   --   --   --    < > = values in this interval not displayed.    Estimated Creatinine Clearance: 90.4 mL/min (by C-G formula based on SCr of 0.92 mg/dL).   Medical History: Past Medical History:  Diagnosis Date  . A-fib ([place])   . Anemia   . Anginal pain ([place])   . Anxiety   . Arthritis   . Asthma   . CAD (coronary artery disease)    a. 2002 CABGx2 (LIMA->LAD, VG->VG->OM1);  b. 09/2012 DES->OM;  c. 03/2015 PTCA of LAD [facility]) in setting of atretic LIMA; d. 05/2015  Cath [facility]): nonobs dzs; e. 06/2015 Cath ([facility]): LM nl, LAD 45p/d ISR, 50d, D1/2 small, LCX 50p/d ISR, OM1 70ost, 30 ISR, VG->OM1 50ost, 57m LIMA->LAD 99p/d - atretic, RCA dom, nl; f.cath 10/16: 40-50%(FFR 0.90) pLAD, 75% (FFR 0.77) mLAD s/p PCI/DES, oRCA 40% (FFR0.95)  . Cancer ([place]    SKIN CANCER ON BACK  . Celiac disease   . Chronic diastolic CHF (congestive heart failure) ([place]    a. 06/2009 Echo: EF 60-65%, Gr 1 DD, triv AI, mildly dil LA, nl RV.  .Marland KitchenCOPD (chronic obstructive pulmonary disease) ([place]    a. Chronic bronchitis and emphysema.  . DDD (degenerative disc disease), lumbar   . Diverticulosis   . Dysrhythmia   . Essential hypertension   . GERD (gastroesophageal reflux disease)   . History of hiatal hernia   .  History of kidney stones    H/O  . History of tobacco abuse    a. Quit 2014.  Marland Kitchen Myocardial infarction ([place]) 2002   4 STENTS  . Pancreatitis   . PSVT (paroxysmal supraventricular tachycardia) ([place])    a. 10/2012 Noted on Zio Patch.  . Sleep apnea    LOST CORD TO CPAP -ONLY 02 @ BEDTIME  . Tubular adenoma of colon   . Type II diabetes mellitus (HCC)     Medications:  Medications Prior to Admission  Medication Sig Dispense Refill Last Dose  . Albuterol Sulfate, sensor, 108 (90 Base) MCG/ACT AEPB Inhale 2 puffs into the lungs at bedtime. AND PRN   PRN  . allopurinol (ZYLOPRIM) 300 MG tablet TAKE 1 TABLET BY MOUTH TWICE A DAY 180 tablet 2 03/07/2020 at 1100  . ALPRAZolam (XANAX) 1 MG tablet Take 1 tablet (1 mg total) by mouth 3 (three) times daily. 90 tablet 4 03/07/2020 at 1100  . aspirin 81 MG chewable tablet Chew 81 mg by mouth daily.   03/07/2020 at 1100  . atorvastatin (LIPITOR) 80 MG tablet TAKE ONE TABLET BY MOUTH AT BEDTIME 90 tablet 1 03/06/2020 at Unknown time  . BREO ELLIPTA 100-25 MCG/INH AEPB INHALE 1 PUFF INTO THE LUNGS DAILY 60 each 5 03/07/2020 at 1100  . budesonide (PULMICORT) 0.5 MG/2ML nebulizer solution Take 0.5 mg by nebulization 2 (two) times daily.    PRN at PRN  . cetirizine (ZYRTEC) 10 MG tablet TAKE ONE TABLET BY MOUTH AT BEDTIME 30 tablet 6 03/06/2020 at Unknown time  . Cholecalciferol (VITAMIN D3) 125 MCG (5000 UT) CAPS Take 5,000 Units by mouth daily.   03/07/2020 at Unknown time  . dapagliflozin propanediol (FARXIGA) 10 MG TABS tablet Take 10 mg by mouth daily.   03/07/2020 at 11:00  . docusate sodium (COLACE) 100 MG capsule Take 100 mg by mouth daily.     at 11:00  . ELIQUIS 5 MG TABS tablet TAKE 1 TABLET BY MOUTH TWICE A DAY 180 tablet 0 03/07/2020 at 1100  . isosorbide mononitrate (IMDUR) 120 MG 24 hr tablet Take 1 tablet (120 mg total) by mouth daily. 30 tablet 5 03/07/2020 at 0900  . LINZESS 72 MCG capsule Take 72 mcg by mouth daily before breakfast.    03/07/2020 at 1100  . lisinopril (ZESTRIL) 10 MG tablet Take 10 mg by mouth daily.   03/07/2020 at 1100  . magnesium oxide (MAG-OX) 400 (241.3 Mg) MG tablet Take 1 tablet (400 mg total) by mouth every morning. 30 tablet 5 03/07/2020 at 1100  . methocarbamol (ROBAXIN) 500 MG tablet TAKE 1 TABLET BY MOUTH 4 TIMES DAILY 60 tablet 3 03/07/2020 at 1100  . nitroGLYCERIN (NITROSTAT) 0.4 MG SL tablet Place 1 tablet (0.4 mg total) under the tongue every 5 (five) minutes as needed for chest pain. Reported on 05/26/2016 30 tablet 3 PRN at PRN  . omega-3 acid ethyl esters (LOVAZA) 1 g capsule Take 4 g by mouth daily.   03/07/2020 at 1100  . oxyCODONE-acetaminophen (PERCOCET) 10-325 MG tablet TAKE 1 TABLET BY MOUTH EVERY 4 HOURS 150 tablet 0 03/07/2020 at 1000  . ranolazine (RANEXA) 1000 MG SR tablet Take 1,000 mg by mouth 2 (two) times daily.   03/07/2020 at Unknown time  . sotalol (BETAPACE) 80 MG tablet Take 80 mg by mouth 2 (two) times daily.   03/07/2020 at 1100  . tiotropium (SPIRIVA) 18 MCG inhalation capsule Place 18 mcg into inhaler and inhale daily.  03/07/2020 at 1100  . torsemide (DEMADEX) 100 MG tablet Take 50 mg by mouth daily.    PRN at PRN  . traZODone (DESYREL) 100 MG tablet Take 100 mg by  mouth at bedtime.   03/06/2020 at 2130  . venlafaxine XR (EFFEXOR-XR) 75 MG 24 hr capsule Take 75 mg by mouth daily with breakfast.   Not Taking at Unknown time   Scheduled:  . allopurinol  300 mg Oral BID  . ALPRAZolam  1 mg Oral TID  . aspirin  81 mg Oral Daily  . [START ON 03/11/2020] aspirin  81 mg Oral Pre-Cath  . atorvastatin  80 mg Oral QHS  . budesonide  0.5 mg Nebulization BID  . cholecalciferol  5,000 Units Oral Daily  . docusate sodium  100 mg Oral Daily  . fluticasone furoate-vilanterol  1 puff Inhalation Daily  . insulin aspart  0-5 Units Subcutaneous QHS  . insulin aspart  0-9 Units Subcutaneous TID WC  . insulin glargine  10 Units Subcutaneous Daily  . isosorbide mononitrate  120 mg Oral Daily  . linaclotide  72 mcg Oral QAC breakfast  . lisinopril  10 mg Oral Daily  . loratadine  10 mg Oral Daily  . magnesium oxide  400 mg Oral [id number]  . methocarbamol  500 mg Oral QID  . omega-3 acid ethyl esters  4 g Oral Daily  . ranolazine  1,000 mg Oral BID  . sodium chloride flush  3 mL Intravenous Q12H  . sotalol  80 mg Oral Q12H  . tiotropium  18 mcg Inhalation Daily  . traZODone  100 mg Oral QHS   Infusions:  . sodium chloride    . sodium chloride 3 mL/kg/hr (03/10/20 2100)   Followed by  . sodium chloride    . heparin 1,400 Units/hr (03/10/20 1547)   PRN:  Anti-infectives (From admission, onward)   None      Assessment: Pharmacy consulted to start heparin for NSTEMI. Pt is on apixaban PTA for afib. hgb and plt stable. Will order baseline aPTT, HL, and INR. Will monitor with aPTT levels until heparin level and aPTT correlate.   04/23 @ 0543 HL 0.68. Therapeutic. 04/23 @ 0543 aPTT 85.  Therapeutic. 04/23 @ 1243 HL 0.53, Therapeutic. 04/25 @ 0500 HL 0.24 subtherapeutic. Will increase rate to 1200 units/hr  4/25 @1358  HL 0.22. Subtherapeutic.Will order Heparin bolus of 1300 units x 1 and increase drip to 1400 units/hr  Goal of Therapy:  Heparin level 0.3-0.7  units/ml Monitor platelets by anticoagulation protocol: Yes   Plan:  4/25 @ 2051 HL 0.33 therapeutic. Will continue current rate of 1400 units/hr and recheck confirmatory HL in 6 hours.  Pearla Dubonnet, PharmD 03/10/2020,9:38 PM

## 2020-03-11 ENCOUNTER — Encounter
Admission: EM | Disposition: A | Discharge status: Home / Self Care | Payer: Self-pay | Source: Home / Self Care | Attending: Internal Medicine

## 2020-03-11 ENCOUNTER — Encounter: Payer: Self-pay | Admitting: Cardiology

## 2020-03-11 DIAGNOSIS — F419 Anxiety disorder, unspecified: Secondary | ICD-10-CM

## 2020-03-11 HISTORY — PX: LEFT HEART CATH AND CORS/GRAFTS ANGIOGRAPHY: CATH118250

## 2020-03-11 LAB — BASIC METABOLIC PANEL
Anion gap: 6 (ref 5–15)
BUN: 11 mg/dL (ref 8–23)
CO2: 31 mmol/L (ref 22–32)
Calcium: 8.6 mg/dL — ABNORMAL LOW (ref 8.9–10.3)
Chloride: 99 mmol/L (ref 98–111)
Creatinine, Ser: 0.74 mg/dL (ref 0.61–1.24)
GFR calc Af Amer: >60 mL/min (ref >60)
GFR calc non Af Amer: >60 mL/min (ref >60)
Glucose, Bld: 155 mg/dL — ABNORMAL HIGH (ref 70–99)
Potassium: 4 mmol/L (ref 3.5–5.1)
Sodium: 136 mmol/L (ref 135–145)

## 2020-03-11 LAB — CBC
HCT: 49.1 % (ref 39.0–52.0)
Hemoglobin: 16 g/dL (ref 13.0–17.0)
MCH: 30.5 pg (ref 26.0–34.0)
MCHC: 32.6 g/dL (ref 30.0–36.0)
MCV: 93.5 fL (ref 80.0–100.0)
Platelets: 130 10*3/uL — ABNORMAL LOW (ref 150–400)
RBC: 5.25 MIL/uL (ref 4.22–5.81)
RDW: 14.6 % (ref 11.5–15.5)
WBC: 5.8 10*3/uL (ref 4.0–10.5)
nRBC: 0 % (ref 0.0–0.2)

## 2020-03-11 LAB — GLUCOSE, CAPILLARY
Glucose-Capillary: 114 mg/dL — ABNORMAL HIGH (ref 70–99)
Glucose-Capillary: 192 mg/dL — ABNORMAL HIGH (ref 70–99)

## 2020-03-11 LAB — HEPARIN LEVEL (UNFRACTIONATED): Heparin Unfractionated: 0.4 IU/mL (ref 0.30–0.70)

## 2020-03-11 SURGERY — LEFT HEART CATH AND CORS/GRAFTS ANGIOGRAPHY
Anesthesia: Moderate Sedation

## 2020-03-11 MED ORDER — OXYCODONE HCL 5 MG PO TABS
5.0000 mg | ORAL_TABLET | Freq: Once | ORAL | Status: AC
  Administered 2020-03-11: 5 mg via ORAL
  Filled 2020-03-11: qty 1

## 2020-03-11 MED ORDER — SODIUM CHLORIDE 0.9% FLUSH: 3.0000 mL | Freq: Two times a day (BID) | INTRAVENOUS | Status: DC

## 2020-03-11 MED ORDER — LABETALOL HCL 5 MG/ML IV SOLN: 10.0000 mg | INTRAVENOUS | Status: AC | PRN

## 2020-03-11 MED ORDER — SODIUM CHLORIDE 0.9 % IV SOLN: 250.0000 mL | INTRAVENOUS | Status: DC | PRN

## 2020-03-11 MED ORDER — HEPARIN (PORCINE) IN NACL 1000-0.9 UT/500ML-% IV SOLN
INTRAVENOUS | Status: AC
  Filled 2020-03-11: qty 1000

## 2020-03-11 MED ORDER — SODIUM CHLORIDE 0.9 % WEIGHT BASED INFUSION: 1.0000 mL/kg/h | INTRAVENOUS | Status: DC

## 2020-03-11 MED ORDER — SODIUM CHLORIDE 0.9% FLUSH: 3.0000 mL | INTRAVENOUS | Status: DC | PRN

## 2020-03-11 MED ORDER — MIDAZOLAM HCL 2 MG/2ML IJ SOLN
INTRAMUSCULAR | Status: DC | PRN
  Administered 2020-03-11: 1 mg via INTRAVENOUS

## 2020-03-11 MED ORDER — IOHEXOL 300 MG/ML  SOLN
INTRAMUSCULAR | Status: DC | PRN
  Administered 2020-03-11: 09:00:00 80 mL

## 2020-03-11 MED ORDER — OXYCODONE-ACETAMINOPHEN 5-325 MG PO TABS
1.0000 | ORAL_TABLET | Freq: Once | ORAL | Status: AC
  Administered 2020-03-11: 1 via ORAL
  Filled 2020-03-11: qty 1

## 2020-03-11 MED ORDER — MIDAZOLAM HCL 2 MG/2ML IJ SOLN
INTRAMUSCULAR | Status: AC
  Filled 2020-03-11: qty 2

## 2020-03-11 MED ORDER — HEPARIN (PORCINE) IN NACL 1000-0.9 UT/500ML-% IV SOLN
INTRAVENOUS | Status: DC | PRN
  Administered 2020-03-11: 500 mL

## 2020-03-11 MED ORDER — FENTANYL CITRATE (PF) 100 MCG/2ML IJ SOLN
INTRAMUSCULAR | Status: DC | PRN
  Administered 2020-03-11: 50 ug via INTRAVENOUS

## 2020-03-11 MED ORDER — FENTANYL CITRATE (PF) 100 MCG/2ML IJ SOLN
INTRAMUSCULAR | Status: AC
  Filled 2020-03-11: qty 2

## 2020-03-11 MED ORDER — ACETAMINOPHEN 325 MG PO TABS: 650.0000 mg | ORAL_TABLET | ORAL | Status: DC | PRN

## 2020-03-11 MED ORDER — HYDRALAZINE HCL 20 MG/ML IJ SOLN: 10.0000 mg | INTRAMUSCULAR | Status: AC | PRN

## 2020-03-11 MED ORDER — ONDANSETRON HCL 4 MG/2ML IJ SOLN: 4.0000 mg | Freq: Four times a day (QID) | INTRAMUSCULAR | Status: DC | PRN

## 2020-03-11 SURGICAL SUPPLY — 9 items
CATH INFINITI 5FR ANG PIGTAIL (CATHETERS) ×3 IMPLANT
CATH INFINITI 5FR JL4 (CATHETERS) ×3 IMPLANT
CATH INFINITI JR4 5F (CATHETERS) ×3 IMPLANT
DEVICE CLOSURE MYNXGRIP 5F (Vascular Products) ×3 IMPLANT
KIT MANI 3VAL PERCEP (MISCELLANEOUS) ×3 IMPLANT
NEEDLE PERC 18GX7CM (NEEDLE) ×3 IMPLANT
PACK CARDIAC CATH (CUSTOM PROCEDURE TRAY) ×3 IMPLANT
SHEATH AVANTI 5FR X 11CM (SHEATH) ×3 IMPLANT
WIRE GUIDERIGHT .035X150 (WIRE) ×3 IMPLANT

## 2020-03-11 NOTE — Discharge Summary (Addendum)
Physician Discharge Summary  Lawrence [name] OFH:[id number] DOB: [birth year] DOA: 03/07/2020  PCP: Birdie Sons, MD  Admit date: 03/07/2020 Discharge date: 03/11/2020  Admitted From: home Disposition:  home  Recommendations for Outpatient Follow-up:  Follow up with PCP in 1-2 weeks Please obtain BMP/CBC in one week Please follow with Dr. Ubaldo Glassing in cardiology in 1 week Please consider returning to see pulmonology for follow up on your chronic lung disease  Home Health: No  Equipment/Devices: None   Discharge Condition: Stable  CODE STATUS: Full  Diet recommendation: Heart Healthy / Carb Modified    Discharge Diagnoses: Principal Problem:   Chest pain Active Problems:   Elevated troponin   HTN (hypertension)   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HCC)   COPD (chronic obstructive pulmonary disease) ([place])   Chronic diastolic CHF (congestive heart failure) (HCC)   Paroxysmal atrial fibrillation (HCC)   HLD (hyperlipidemia)   Anxiety    Summary of HPI and Hospital Course:  64 year old male with history of essential hypertension, dyslipidemia, type 2 diabetes, COPD, asthma, off of track sleep apnea, history of pancreatitis, chronic diastolic congestive heart failure, coronary disease with history of CABG, atrial fibrillation on Eliquis.  Patient came to the hospital complaint chest pain started 2 days ago.  He had a peak troponin was 152, dropped down to 117.  Consulted cardiology.  Patient last heart cath did not show significant occlusion.   Chest pain / Elevated troponin - cardiac etiology likely unstable angina - patient with recurent admissions for acute CHF decompensations and chest pain.  Angiogram on 4/26 showed patent stents and graft.  Continue current medication regimen and follow with up Dr. Ubaldo Glassing in 1 week.  Coronary Artery Disease - with complex history (status post anterior STEMI, rescue CABG x2, chronic atretic LIMA to LAD, status post multiple stents SVG to  OM1, recent cardiac catheterization 06/12/2019 revealed atretic LIMA to LAD, patent SVG to OM1, patent stents proximal LAD and proximal left circumflex)  Paroxysmal A-fib - rate controlled.  Continue sotalol.  Resume Eliquis tomorrow since cath today.  COPD - not exacerbated. Stable.  No changes.  Type 2 Diabetes - covered with insulin during admission.  Continue Faxiga on discharge.  Follow up PCP.  Hyperkalemia - mild, resolved with Kayexalate.  Follow up BMP with PCP.  Hypertension - stable, continue home regimen.   Discharge Instructions   Discharge Instructions     Diet - low sodium heart healthy   Complete by: As directed    Increase activity slowly   Complete by: As directed       Allergies as of 03/11/2020       Reactions   Demerol  [meperidine Hcl]    Demerol [meperidine] Hives   Jardiance [empagliflozin] Other (See Comments)   Perineal pain   Prednisone Other (See Comments), Hypertension   Pt states that this medication puts him in A-fib    Sulfa Antibiotics Hives   Albuterol Sulfate [albuterol] Palpitations, Other (See Comments)   Pt currently uses this medication.     Morphine Sulfate Nausea And Vomiting, Rash, Other (See Comments)   Pt states that he is only allergic to the tablet form of this medication.          Medication List     TAKE these medications    Albuterol Sulfate (sensor) 108 (90 Base) MCG/ACT Aepb Inhale 2 puffs into the lungs at bedtime. AND PRN   allopurinol 300 MG tablet Commonly known as: ZYLOPRIM TAKE 1 TABLET  BY MOUTH TWICE A DAY   ALPRAZolam 1 MG tablet Commonly known as: XANAX Take 1 tablet (1 mg total) by mouth 3 (three) times daily.   aspirin 81 MG chewable tablet Chew 81 mg by mouth daily.   atorvastatin 80 MG tablet Commonly known as: LIPITOR TAKE ONE TABLET BY MOUTH AT BEDTIME   Breo Ellipta 100-25 MCG/INH Aepb Generic drug: fluticasone furoate-vilanterol INHALE 1 PUFF INTO THE LUNGS DAILY   budesonide 0.5  MG/2ML nebulizer solution Commonly known as: PULMICORT Take 0.5 mg by nebulization 2 (two) times daily.   cetirizine 10 MG tablet Commonly known as: ZYRTEC TAKE ONE TABLET BY MOUTH AT BEDTIME   docusate sodium 100 MG capsule Commonly known as: COLACE Take 100 mg by mouth daily.   Eliquis 5 MG Tabs tablet Generic drug: apixaban TAKE 1 TABLET BY MOUTH TWICE A DAY   Farxiga 10 MG Tabs tablet Generic drug: dapagliflozin propanediol Take 10 mg by mouth daily.   isosorbide mononitrate 120 MG 24 hr tablet Commonly known as: IMDUR Take 1 tablet (120 mg total) by mouth daily.   Linzess 72 MCG capsule Generic drug: linaclotide Take 72 mcg by mouth daily before breakfast.   lisinopril 10 MG tablet Commonly known as: ZESTRIL Take 10 mg by mouth daily.   magnesium oxide 400 (241.3 Mg) MG tablet Commonly known as: MAG-OX Take 1 tablet (400 mg total) by mouth every morning.   methocarbamol 500 MG tablet Commonly known as: ROBAXIN TAKE 1 TABLET BY MOUTH 4 TIMES DAILY   nitroGLYCERIN 0.4 MG SL tablet Commonly known as: NITROSTAT Place 1 tablet (0.4 mg total) under the tongue every 5 (five) minutes as needed for chest pain. Reported on 05/26/2016   omega-3 acid ethyl esters 1 g capsule Commonly known as: LOVAZA Take 4 g by mouth daily.   oxyCODONE-acetaminophen 10-325 MG tablet Commonly known as: PERCOCET TAKE 1 TABLET BY MOUTH EVERY 4 HOURS   ranolazine 1000 MG SR tablet Commonly known as: RANEXA Take 1,000 mg by mouth 2 (two) times daily.   sotalol 80 MG tablet Commonly known as: BETAPACE Take 80 mg by mouth 2 (two) times daily.   tiotropium 18 MCG inhalation capsule Commonly known as: SPIRIVA Place 18 mcg into inhaler and inhale daily.   torsemide 100 MG tablet Commonly known as: DEMADEX Take 50 mg by mouth daily.   traZODone 100 MG tablet Commonly known as: DESYREL Take 100 mg by mouth at bedtime.   venlafaxine XR 75 MG 24 hr capsule Commonly known as:  EFFEXOR-XR Take 75 mg by mouth daily with breakfast.   Vitamin D3 125 MCG (5000 UT) Caps Take 5,000 Units by mouth daily.       Follow-up Information     Birdie Sons, MD. Go on 03/29/2020.   Specialty: Family Medicine Why: Appointment at [facility] information: [address] [place] [address] [phone]         Teodoro Spray, MD. Go on 03/18/2020.   Specialty: Cardiology Why: Appointment at 2:30pm Contact information: [place] [address] [phone]           Allergies  Allergen Reactions   Demerol  [Meperidine Hcl]    Demerol [Meperidine] Hives   Jardiance [Empagliflozin] Other (See Comments)    Perineal pain   Prednisone Other (See Comments) and Hypertension    Pt states that this medication puts him in A-fib    Sulfa Antibiotics Hives   Albuterol Sulfate [Albuterol] Palpitations  and Other (See Comments)    Pt currently uses this medication.     Morphine Sulfate Nausea And Vomiting, Rash and Other (See Comments)    Pt states that he is only allergic to the tablet form of this medication.      Consultations: cardiology    Procedures/Studies: DG Chest 2 View  Result Date: 03/07/2020 CLINICAL DATA:  Pt presents to ED c/o L-sided chest pain 9/10 starting yesterday. Pt states he took 3 NTG yesterday with relief and 3 today, last one at 1200, with no relief today. Extensive cardiac hx, states last cath last year. former smoker quit in 2014 EXAM: CHEST - 2 VIEW COMPARISON:  10/04/2019 FINDINGS: Stable changes from previous cardiac surgery. The cardiac silhouette is normal in size and configuration. No mediastinal or hilar masses. Linear opacities are noted in the lung bases consistent a combination of prominent bronchovascular markings and scarring or atelectasis. Lungs are hyperexpanded but otherwise clear. No pleural effusion or pneumothorax. Skeletal structures are demineralized but intact. IMPRESSION: 1. No  acute cardiopulmonary disease. 2. Hyperexpanded lungs suggesting COPD. Electronically Signed   By: Lajean Manes M.D.   On: 03/07/2020 13:12   CARDIAC CATHETERIZATION  Result Date: 03/11/2020  1st Mrg lesion is 70% stenosed.  Previously placed Prox Cx to Mid Cx stent (unknown type) is widely patent.  Prox LAD to Mid LAD lesion is 30% stenosed.  Mid LAD lesion is 40% stenosed.  Previously placed Mid LAD to Dist LAD stent (unknown type) is widely patent.  Prox RCA lesion is 15% stenosed.  Dist RCA lesion is 25% stenosed.  RPAV lesion is 60% stenosed.  Previously placed Origin to Prox Graft stent (unknown type) is widely patent.  LIMA graft was not injected and is small.  1.  Two-vessel coronary artery disease with patent stents proximal mid and distal LAD, proximal left circumflex, 70% stenosis OM1 2.  Atretic LIMA to LAD, patent SVG to OM1 with patent stent ostium SVG 3.  Mildly reduced left ventricular function with estimated LVEF 40 to 45% 4.  No significant changes observed compared to prior cardiac catheterization 06/12/2019 Recommendations 1.  Continue medical therapy 2.  Aggressive risk factor modifications 3.  Follow-up with Dr. Ubaldo Glassing in 1 week   ECHOCARDIOGRAM COMPLETE  Result Date: 03/08/2020    ECHOCARDIOGRAM REPORT   Patient Name:   LOCHLAN GRYGIEL [facility] Date of Exam: 03/08/2020 Medical Rec #:  [id number]       Height:       67.0 in Accession #:    [id number]      Weight:       227.0 lb Date of Birth:  [birth year]       BSA:          2.134 m Patient Age:    48 years        BP:           106/64 mmHg Patient Gender: M               HR:           58 bpm. Exam Location:  [facility] Procedure: 2D Echo, Cardiac Doppler and Color Doppler Indications:     Chest pain 786.50  History:         Patient has prior history of Echocardiogram examinations, most                  recent 06/11/2019. COPD; Risk Factors:Sleep Apnea and Diabetes.  MI.  Sonographer:     Sherrie Sport RDCS (AE) Referring Phys:  [place] Diagnosing Phys: Isaias Cowman MD  Sonographer Comments: Suboptimal apical window. IMPRESSIONS  1. Left ventricular ejection fraction, by estimation, is 45 to 50%. The left ventricle has mildly decreased function. The left ventricle has no regional wall motion abnormalities. Left ventricular diastolic parameters are indeterminate.  2. Right ventricular systolic function is normal. The right ventricular size is normal. There is normal pulmonary artery systolic pressure.  3. The mitral valve is normal in structure. Mild mitral valve regurgitation. No evidence of mitral stenosis.  4. The aortic valve is normal in structure. Aortic valve regurgitation is not visualized. No aortic stenosis is present.  5. The inferior vena cava is normal in size with greater than 50% respiratory variability, suggesting right atrial pressure of 3 mmHg. FINDINGS  Left Ventricle: Left ventricular ejection fraction, by estimation, is 45 to 50%. The left ventricle has mildly decreased function. The left ventricle has no regional wall motion abnormalities. The left ventricular internal cavity size was normal in size. There is no left ventricular hypertrophy. Left ventricular diastolic parameters are indeterminate. Right Ventricle: The right ventricular size is normal. No increase in right ventricular wall thickness. Right ventricular systolic function is normal. There is normal pulmonary artery systolic pressure. The tricuspid regurgitant velocity is 1.86 m/s, and  with an assumed right atrial pressure of 10 mmHg, the estimated right ventricular systolic pressure is 16.1 mmHg. Left Atrium: Left atrial size was normal in size. Right Atrium: Right atrial size was normal in size. Pericardium: There is no evidence of pericardial effusion. Mitral Valve: The mitral valve is normal in structure. Normal mobility of the mitral valve leaflets. Mild mitral valve regurgitation. No evidence of mitral valve stenosis. Tricuspid  Valve: The tricuspid valve is normal in structure. Tricuspid valve regurgitation is mild . No evidence of tricuspid stenosis. Aortic Valve: The aortic valve is normal in structure. Aortic valve regurgitation is not visualized. No aortic stenosis is present. Aortic valve mean gradient measures 2.5 mmHg. Aortic valve peak gradient measures 5.5 mmHg. Aortic valve area, by VTI measures 3.67 cm. Pulmonic Valve: The pulmonic valve was normal in structure. Pulmonic valve regurgitation is not visualized. No evidence of pulmonic stenosis. Aorta: The aortic root is normal in size and structure. Venous: The inferior vena cava is normal in size with greater than 50% respiratory variability, suggesting right atrial pressure of 3 mmHg. IAS/Shunts: No atrial level shunt detected by color flow Doppler.  LEFT VENTRICLE PLAX 2D LVIDd:         3.42 cm LVIDs:         2.55 cm LV PW:         1.04 cm LV IVS:        1.71 cm LVOT diam:     2.20 cm LV SV:         85 LV SV Index:   40 LVOT Area:     3.80 cm  RIGHT VENTRICLE RV Basal diam:  3.45 cm RV S prime:     9.25 cm/s TAPSE (M-mode): 3.0 cm LEFT ATRIUM              Index       RIGHT ATRIUM           Index LA diam:        3.50 cm  1.64 cm/m  RA Area:     22.80 cm LA Vol (A2C):   119.0 ml  55.76 ml/m RA Volume:   71.60 ml  33.55 ml/m LA Vol (A4C):   59.2 ml  27.74 ml/m LA Biplane Vol: 90.1 ml  42.22 ml/m  AORTIC VALVE                   PULMONIC VALVE AV Area (Vmax):    3.69 cm    PV Vmax:        0.67 m/s AV Area (Vmean):   4.09 cm    PV Peak grad:   1.8 mmHg AV Area (VTI):     3.67 cm    RVOT Peak grad: 2 mmHg AV Vmax:           117.50 cm/s AV Vmean:          76.400 cm/s AV VTI:            0.232 m AV Peak Grad:      5.5 mmHg AV Mean Grad:      2.5 mmHg LVOT Vmax:         114.00 cm/s LVOT Vmean:        82.300 cm/s LVOT VTI:          0.224 m LVOT/AV VTI ratio: 0.97  AORTA Ao Root diam: 3.20 cm TRICUSPID VALVE TR Peak grad:   13.8 mmHg TR Vmax:        186.00 cm/s  SHUNTS Systemic  VTI:  0.22 m Systemic Diam: 2.20 cm Isaias Cowman MD Electronically signed by Isaias Cowman MD Signature Date/Time: 03/08/2020/1:07:36 PM    Final        Subjective: Patient seen after cath.  Reports feeling "rough" but no specific complaints.  Says he has not seen pulmonology since his wife got sick last year (cancer diagnosis and died in 29-Aug-2023).  Agrees to re-establish with them.  No active chest pain.   Discharge Exam: Vitals:   03/11/20 0915 03/11/20 0939  BP: (!) 148/85 (!) 157/77  Pulse: (!) 53 (!) 54  Resp: 12   Temp:  97.7 F (36.5 C)  SpO2: 98% 96%   Vitals:   03/11/20 0845 03/11/20 0900 03/11/20 0915 03/11/20 0939  BP: (!) 151/92 (!) 155/94 (!) 148/85 (!) 157/77  Pulse: (!) 57 (!) 58 (!) 53 (!) 54  Resp: 13 10 12    Temp:    97.7 F (36.5 C)  TempSrc:    Oral  SpO2: 98% 99% 98% 96%  Weight:      Height:        General: Pt is alert, awake, not in acute distress Cardiovascular: RRR, S1/S2 +, no rubs, no gallops Respiratory: CTA bilaterally, no wheezing, no rhonchi Abdominal: Soft, NT, ND, bowel sounds + Extremities: no edema, no cyanosis    The results of significant diagnostics from this hospitalization (including imaging, microbiology, ancillary and laboratory) are listed below for reference.     Microbiology: Recent Results (from the past 240 hour(s))  Respiratory Panel by RT PCR (Flu A&B, Covid) - Nasopharyngeal Swab     Status: None   Collection Time: 03/07/20  4:44 PM   Specimen: Nasopharyngeal Swab  Result Value Ref Range Status   SARS Coronavirus 2 by RT PCR NEGATIVE NEGATIVE Final    Comment: (NOTE) SARS-CoV-2 target nucleic acids are NOT DETECTED. The SARS-CoV-2 RNA is generally detectable in upper respiratoy specimens during the acute phase of infection. The lowest concentration of SARS-CoV-2 viral copies this assay can detect is 131 copies/mL. A negative result does not preclude SARS-Cov-2 infection and should  not be used as the  sole basis for treatment or other patient management decisions. A negative result may occur with  improper specimen collection/handling, submission of specimen other than nasopharyngeal swab, presence of viral mutation(s) within the areas targeted by this assay, and inadequate number of viral copies (<131 copies/mL). A negative result must be combined with clinical observations, patient history, and epidemiological information. The expected result is Negative. Fact Sheet for Patients:  PinkCheek.be Fact Sheet for Healthcare Providers:  GravelBags.it This test is not yet ap proved or cleared by the [place] FDA and  has been authorized for detection and/or diagnosis of SARS-CoV-2 by FDA under an Emergency Use Authorization (EUA). This EUA will remain  in effect (meaning this test can be used) for the duration of the COVID-19 declaration under Section 564(b)(1) of the Act, 21 U.S.C. section 360bbb-3(b)(1), unless the authorization is terminated or revoked sooner.    Influenza A by PCR NEGATIVE NEGATIVE Final   Influenza B by PCR NEGATIVE NEGATIVE Final    Comment: (NOTE) The Xpert Xpress SARS-CoV-2/FLU/RSV assay is intended as an aid in  the diagnosis of influenza from Nasopharyngeal swab specimens and  should not be used as a sole basis for treatment. Nasal washings and  aspirates are unacceptable for Xpert Xpress SARS-CoV-2/FLU/RSV  testing. Fact Sheet for Patients: PinkCheek.be Fact Sheet for Healthcare Providers: GravelBags.it This test is not yet approved or cleared by the [place] FDA and  has been authorized for detection and/or diagnosis of SARS-CoV-2 by  FDA under an Emergency Use Authorization (EUA). This EUA will remain  in effect (meaning this test can be used) for the duration of the  Covid-19 declaration under Section 564(b)(1) of the Act, 21   U.S.C. section 360bbb-3(b)(1), unless the authorization is  terminated or revoked. Performed at [facility], [place], [place] [address]   C Difficile Quick Screen w PCR reflex     Status: None   Collection Time: 03/08/20  3:25 PM   Specimen: STOOL  Result Value Ref Range Status   C Diff antigen NEGATIVE NEGATIVE Final   C Diff toxin NEGATIVE NEGATIVE Final   C Diff interpretation No C. difficile detected.  Final    Comment: Performed at [facility], [address] [place], [place] [address]     Labs: BNP (last 3 results) Recent Labs    06/10/19 2028 03/07/20 1226  BNP 141.0* 443.1*   Basic Metabolic Panel: Recent Labs  Lab 03/07/20 1227 03/07/20 1227 03/07/20 1807 03/08/20 0543 03/09/20 0439 03/09/20 1435 03/10/20 0513 03/11/20 0321  NA 137  --   --  139 136  --  138 136  K 4.3   < >  --  4.1 5.2* 4.0 3.8 4.0  CL 98  --   --  98 98  --  100 99  CO2 30  --   --  32 32  --  33* 31  GLUCOSE 307*  --   --  215* 193*  --  165* 155*  BUN 12  --   --  16 20  --  14 11  CREATININE 1.01  --   --  1.05 1.07  --  0.92 0.74  CALCIUM 9.2  --   --  8.7* 8.8*  --  8.5* 8.6*  MG  --   --  2.2  --   --   --   --   --    < > = values  in this interval not displayed.   Liver Function Tests: No results for input(s): AST, ALT, ALKPHOS, BILITOT, PROT, ALBUMIN in the last 168 hours. No results for input(s): LIPASE, AMYLASE in the last 168 hours. No results for input(s): AMMONIA in the last 168 hours. CBC: Recent Labs  Lab 03/07/20 1227 03/08/20 0543 03/09/20 0439 03/11/20 0321  WBC 6.2 7.2 7.6 5.8  HGB 17.7* 16.7 16.6 16.0  HCT 55.4* 51.6 52.5* 49.1  MCV 93.9 93.6 95.5 93.5  PLT 150 143* 138* 130*   Cardiac Enzymes: No results for input(s): CKTOTAL, CKMB, CKMBINDEX, TROPONINI in the last 168 hours. BNP: Invalid input(s): POCBNP CBG: Recent Labs  Lab 03/10/20 0731 03/10/20 1233 03/10/20 1658 03/10/20 2107 03/11/20 0713  GLUCAP  154* 154* 130* 173* 114*   D-Dimer No results for input(s): DDIMER in the last 72 hours. Hgb A1c No results for input(s): HGBA1C in the last 72 hours. Lipid Profile No results for input(s): CHOL, HDL, LDLCALC, TRIG, CHOLHDL, LDLDIRECT in the last 72 hours. Thyroid function studies No results for input(s): TSH, T4TOTAL, T3FREE, THYROIDAB in the last 72 hours.  Invalid input(s): FREET3 Anemia work up No results for input(s): VITAMINB12, FOLATE, FERRITIN, TIBC, IRON, RETICCTPCT in the last 72 hours. Urinalysis    Component Value Date/Time   COLORURINE AMBER (A) 10/03/2019 2002   APPEARANCEUR CLEAR (A) 10/03/2019 2002   APPEARANCEUR Clear 05/25/2019 1554   LABSPEC 1.037 (H) 10/03/2019 2002   LABSPEC 1.012 03/07/2015 2143   PHURINE 5.0 10/03/2019 2002   GLUCOSEU >=500 (A) 10/03/2019 2002   GLUCOSEU Negative 03/07/2015 2143   HGBUR NEGATIVE 10/03/2019 2002   [place] NEGATIVE 10/03/2019 2002   BILIRUBINUR Negative 05/25/2019 1554   BILIRUBINUR Negative 03/07/2015 2143   [place] NEGATIVE 10/03/2019 2002   PROTEINUR 100 (A) 10/03/2019 2002   UROBILINOGEN 0.2 03/26/2017 0907   NITRITE NEGATIVE 10/03/2019 2002   LEUKOCYTESUR NEGATIVE 10/03/2019 2002   LEUKOCYTESUR Negative 03/07/2015 2143   Sepsis Labs Invalid input(s): PROCALCITONIN,  WBC,  LACTICIDVEN Microbiology Recent Results (from the past 240 hour(s))  Respiratory Panel by RT PCR (Flu A&B, Covid) - Nasopharyngeal Swab     Status: None   Collection Time: 03/07/20  4:44 PM   Specimen: Nasopharyngeal Swab  Result Value Ref Range Status   SARS Coronavirus 2 by RT PCR NEGATIVE NEGATIVE Final    Comment: (NOTE) SARS-CoV-2 target nucleic acids are NOT DETECTED. The SARS-CoV-2 RNA is generally detectable in upper respiratoy specimens during the acute phase of infection. The lowest concentration of SARS-CoV-2 viral copies this assay can detect is 131 copies/mL. A negative result does not preclude SARS-Cov-2 infection and  should not be used as the sole basis for treatment or other patient management decisions. A negative result may occur with  improper specimen collection/handling, submission of specimen other than nasopharyngeal swab, presence of viral mutation(s) within the areas targeted by this assay, and inadequate number of viral copies (<131 copies/mL). A negative result must be combined with clinical observations, patient history, and epidemiological information. The expected result is Negative. Fact Sheet for Patients:  PinkCheek.be Fact Sheet for Healthcare Providers:  GravelBags.it This test is not yet ap proved or cleared by the [place] FDA and  has been authorized for detection and/or diagnosis of SARS-CoV-2 by FDA under an Emergency Use Authorization (EUA). This EUA will remain  in effect (meaning this test can be used) for the duration of the COVID-19 declaration under Section 564(b)(1) of the Act, 21 U.S.C. section 360bbb-3(b)(1), unless the  authorization is terminated or revoked sooner.    Influenza A by PCR NEGATIVE NEGATIVE Final   Influenza B by PCR NEGATIVE NEGATIVE Final    Comment: (NOTE) The Xpert Xpress SARS-CoV-2/FLU/RSV assay is intended as an aid in  the diagnosis of influenza from Nasopharyngeal swab specimens and  should not be used as a sole basis for treatment. Nasal washings and  aspirates are unacceptable for Xpert Xpress SARS-CoV-2/FLU/RSV  testing. Fact Sheet for Patients: PinkCheek.be Fact Sheet for Healthcare Providers: GravelBags.it This test is not yet approved or cleared by the [place] FDA and  has been authorized for detection and/or diagnosis of SARS-CoV-2 by  FDA under an Emergency Use Authorization (EUA). This EUA will remain  in effect (meaning this test can be used) for the duration of the  Covid-19 declaration under Section  564(b)(1) of the Act, 21  U.S.C. section 360bbb-3(b)(1), unless the authorization is  terminated or revoked. Performed at [facility], [place], [place] [address]   C Difficile Quick Screen w PCR reflex     Status: None   Collection Time: 03/08/20  3:25 PM   Specimen: STOOL  Result Value Ref Range Status   C Diff antigen NEGATIVE NEGATIVE Final   C Diff toxin NEGATIVE NEGATIVE Final   C Diff interpretation No C. difficile detected.  Final    Comment: Performed at [facility], [address] [place], [place] [address]     Time coordinating discharge: Over 30 minutes  SIGNED:   Ezekiel Slocumb, DO [facility] 03/11/2020, 12:07 PM   If 7PM-7AM, please contact night-coverage [email or web address]

## 2020-03-11 NOTE — Progress Notes (Signed)
Pt returns from cath lab. Right femoral site is clean, dry and intact. Pulses positive bilaterally. VSS. Pt still complains of pain 8/10 mid chest. I will continue to assess.

## 2020-03-11 NOTE — Consult Note (Signed)
[facility] for Heparin Indication: chest pain/ACS  Allergies  Allergen Reactions  . Demerol  [Meperidine Hcl]   . Demerol [Meperidine] Hives  . Jardiance [Empagliflozin] Other (See Comments)    Perineal pain  . Prednisone Other (See Comments) and Hypertension    Pt states that this medication puts him in A-fib   . Sulfa Antibiotics Hives  . Albuterol Sulfate [Albuterol] Palpitations and Other (See Comments)    Pt currently uses this medication.    . Morphine Sulfate Nausea And Vomiting, Rash and Other (See Comments)    Pt states that he is only allergic to the tablet form of this medication.      Patient Measurements: Height: 5' 7"  (170.2 cm) Weight: 103.4 kg (227 lb 15.3 oz) IBW/kg (Calculated) : 66.1 Heparin Dosing Weight: 88.7 kg  Vital Signs: Temp: 97.8 F (36.6 C) (04/26 0408) Temp Source: Oral (04/25 1940) BP: 152/83 (04/26 0408) Pulse Rate: 58 (04/26 0408)  Labs: Recent Labs    03/08/20 0543 03/08/20 1221 03/09/20 0439 03/09/20 0439 03/10/20 0513 03/10/20 0513 03/10/20 1358 03/10/20 2051 03/11/20 0321  HGB 16.7  --  16.6  --   --   --   --   --  16.0  HCT 51.6  --  52.5*  --   --   --   --   --  49.1  PLT 143*  --  138*  --   --   --   --   --  130*  APTT 85*  --   --   --   --   --   --   --   --   HEPARINUNFRC 0.68   < > 0.40   < > 0.24*   < > 0.22* 0.33 0.40  CREATININE 1.05  --  1.07  --  0.92  --   --   --  0.74   < > = values in this interval not displayed.    Estimated Creatinine Clearance: 104.1 mL/min (by C-G formula based on SCr of 0.74 mg/dL).   Medical History: Past Medical History:  Diagnosis Date  . A-fib ([place])   . Anemia   . Anginal pain ([place])   . Anxiety   . Arthritis   . Asthma   . CAD (coronary artery disease)    a. 2002 CABGx2 (LIMA->LAD, VG->VG->OM1);  b. 09/2012 DES->OM;  c. 03/2015 PTCA of LAD [facility]) in setting of atretic LIMA; d. 05/2015 Cath [facility]): nonobs dzs; e. 06/2015 Cath ([facility]): LM  nl, LAD 45p/d ISR, 50d, D1/2 small, LCX 50p/d ISR, OM1 70ost, 30 ISR, VG->OM1 50ost, 37m LIMA->LAD 99p/d - atretic, RCA dom, nl; f.cath 10/16: 40-50%(FFR 0.90) pLAD, 75% (FFR 0.77) mLAD s/p PCI/DES, oRCA 40% (FFR0.95)  . Cancer ([place]    SKIN CANCER ON BACK  . Celiac disease   . Chronic diastolic CHF (congestive heart failure) ([place]    a. 06/2009 Echo: EF 60-65%, Gr 1 DD, triv AI, mildly dil LA, nl RV.  .Marland KitchenCOPD (chronic obstructive pulmonary disease) ([place]    a. Chronic bronchitis and emphysema.  . DDD (degenerative disc disease), lumbar   . Diverticulosis   . Dysrhythmia   . Essential hypertension   . GERD (gastroesophageal reflux disease)   . History of hiatal hernia   . History of kidney stones    H/O  . History of tobacco abuse    a. Quit 2014.  .Marland KitchenMyocardial infarction ([facility] 2002   4  STENTS  . Pancreatitis   . PSVT (paroxysmal supraventricular tachycardia) ([place])    a. 10/2012 Noted on Zio Patch.  . Sleep apnea    LOST CORD TO CPAP -ONLY 02 @ BEDTIME  . Tubular adenoma of colon   . Type II diabetes mellitus (HCC)     Medications:  Medications Prior to Admission  Medication Sig Dispense Refill Last Dose  . Albuterol Sulfate, sensor, 108 (90 Base) MCG/ACT AEPB Inhale 2 puffs into the lungs at bedtime. AND PRN   PRN  . allopurinol (ZYLOPRIM) 300 MG tablet TAKE 1 TABLET BY MOUTH TWICE A DAY 180 tablet 2 03/07/2020 at 1100  . ALPRAZolam (XANAX) 1 MG tablet Take 1 tablet (1 mg total) by mouth 3 (three) times daily. 90 tablet 4 03/07/2020 at 1100  . aspirin 81 MG chewable tablet Chew 81 mg by mouth daily.   03/07/2020 at 1100  . atorvastatin (LIPITOR) 80 MG tablet TAKE ONE TABLET BY MOUTH AT BEDTIME 90 tablet 1 03/06/2020 at Unknown time  . BREO ELLIPTA 100-25 MCG/INH AEPB INHALE 1 PUFF INTO THE LUNGS DAILY 60 each 5 03/07/2020 at 1100  . budesonide (PULMICORT) 0.5 MG/2ML nebulizer solution Take 0.5 mg by nebulization 2 (two) times daily.   PRN at PRN  . cetirizine (ZYRTEC) 10 MG tablet  TAKE ONE TABLET BY MOUTH AT BEDTIME 30 tablet 6 03/06/2020 at Unknown time  . Cholecalciferol (VITAMIN D3) 125 MCG (5000 UT) CAPS Take 5,000 Units by mouth daily.   03/07/2020 at Unknown time  . dapagliflozin propanediol (FARXIGA) 10 MG TABS tablet Take 10 mg by mouth daily.   03/07/2020 at 11:00  . docusate sodium (COLACE) 100 MG capsule Take 100 mg by mouth daily.     at 11:00  . ELIQUIS 5 MG TABS tablet TAKE 1 TABLET BY MOUTH TWICE A DAY 180 tablet 0 03/07/2020 at 1100  . isosorbide mononitrate (IMDUR) 120 MG 24 hr tablet Take 1 tablet (120 mg total) by mouth daily. 30 tablet 5 03/07/2020 at 0900  . LINZESS 72 MCG capsule Take 72 mcg by mouth daily before breakfast.    03/07/2020 at 1100  . lisinopril (ZESTRIL) 10 MG tablet Take 10 mg by mouth daily.   03/07/2020 at 1100  . magnesium oxide (MAG-OX) 400 (241.3 Mg) MG tablet Take 1 tablet (400 mg total) by mouth every morning. 30 tablet 5 03/07/2020 at 1100  . methocarbamol (ROBAXIN) 500 MG tablet TAKE 1 TABLET BY MOUTH 4 TIMES DAILY 60 tablet 3 03/07/2020 at 1100  . nitroGLYCERIN (NITROSTAT) 0.4 MG SL tablet Place 1 tablet (0.4 mg total) under the tongue every 5 (five) minutes as needed for chest pain. Reported on 05/26/2016 30 tablet 3 PRN at PRN  . omega-3 acid ethyl esters (LOVAZA) 1 g capsule Take 4 g by mouth daily.   03/07/2020 at 1100  . oxyCODONE-acetaminophen (PERCOCET) 10-325 MG tablet TAKE 1 TABLET BY MOUTH EVERY 4 HOURS 150 tablet 0 03/07/2020 at 1000  . ranolazine (RANEXA) 1000 MG SR tablet Take 1,000 mg by mouth 2 (two) times daily.   03/07/2020 at Unknown time  . sotalol (BETAPACE) 80 MG tablet Take 80 mg by mouth 2 (two) times daily.   03/07/2020 at 1100  . tiotropium (SPIRIVA) 18 MCG inhalation capsule Place 18 mcg into inhaler and inhale daily.   03/07/2020 at 1100  . torsemide (DEMADEX) 100 MG tablet Take 50 mg by mouth daily.    PRN at PRN  . traZODone (DESYREL) 100 MG tablet  Take 100 mg by mouth at bedtime.   03/06/2020 at 2130  .  venlafaxine XR (EFFEXOR-XR) 75 MG 24 hr capsule Take 75 mg by mouth daily with breakfast.   Not Taking at Unknown time   Scheduled:  . allopurinol  300 mg Oral BID  . ALPRAZolam  1 mg Oral TID  . aspirin  81 mg Oral Daily  . aspirin  81 mg Oral Pre-Cath  . atorvastatin  80 mg Oral QHS  . budesonide  0.5 mg Nebulization BID  . cholecalciferol  5,000 Units Oral Daily  . docusate sodium  100 mg Oral Daily  . fluticasone furoate-vilanterol  1 puff Inhalation Daily  . insulin aspart  0-5 Units Subcutaneous QHS  . insulin aspart  0-9 Units Subcutaneous TID WC  . insulin glargine  10 Units Subcutaneous Daily  . isosorbide mononitrate  120 mg Oral Daily  . linaclotide  72 mcg Oral QAC breakfast  . lisinopril  10 mg Oral Daily  . loratadine  10 mg Oral Daily  . magnesium oxide  400 mg Oral [id number]  . methocarbamol  500 mg Oral QID  . omega-3 acid ethyl esters  4 g Oral Daily  . ranolazine  1,000 mg Oral BID  . sodium chloride flush  3 mL Intravenous Q12H  . sotalol  80 mg Oral Q12H  . tiotropium  18 mcg Inhalation Daily  . traZODone  100 mg Oral QHS   Infusions:  . sodium chloride    . sodium chloride 1 mL/kg/hr (03/11/20 0424)  . heparin 1,400 Units/hr (03/11/20 0423)   PRN:  Anti-infectives (From admission, onward)   None      Assessment: Pharmacy consulted to start heparin for NSTEMI. Pt is on apixaban PTA for afib. hgb and plt stable. Will order baseline aPTT, HL, and INR. Will monitor with aPTT levels until heparin level and aPTT correlate.   04/23 @ 0543 HL 0.68. Therapeutic. 04/23 @ 0543 aPTT 85.  Therapeutic. 04/23 @ 1243 HL 0.53, Therapeutic. 04/25 @ 0500 HL 0.24 subtherapeutic. Will increase rate to 1200 units/hr  4/25 @1358  HL 0.22. Subtherapeutic.Will order Heparin bolus of 1300 units x 1 and increase drip to 1400 units/hr  Goal of Therapy:  Heparin level 0.3-0.7 units/ml Monitor platelets by anticoagulation protocol: Yes   Plan:  04/26 @ 2051 HL 0.40  therapeutic. Will continue current rate of 1400 units/hr and recheck HL w/ am labs, CBC stable will continue to monitor.  Tobie Lords, PharmD 03/11/2020,5:30 AM

## 2020-03-12 ENCOUNTER — Telehealth: Payer: Self-pay

## 2020-03-12 NOTE — Telephone Encounter (Signed)
HFU scheduled 03/29/20 @ 3:00 PM.

## 2020-03-12 NOTE — Telephone Encounter (Signed)
I have made the 1st attempt to contact the patient or family member in charge, in order to follow up from recently being discharged from the hospital. I left a message on voicemail requesting a CB. -MM 

## 2020-03-13 NOTE — Telephone Encounter (Signed)
Transition Care Management Follow-up Telephone Call  Date of discharge and from where: [facility] on 03/11/20.  How have you been since you were released from the hospital? Pt has fell twice since d/c. Pt is currently residing with his girlfriend so she can help him for this week. Both falls were accidental and he had no injuries. The heart cath wound is healing well. Pt to remove patch today. Pt does have SOB but states that is common due to COPD. Oxygen has been running between 90-100. Declines pain, bleeding, swelling, fever, weakness or n/v/d.  Any questions or concerns? No   Items Reviewed:  Did the pt receive and understand the discharge instructions provided? Yes   Medications obtained and verified? No, pt declined verifying at this time but did state there has been no changes. Pt to verify at [facility].  Any new allergies since your discharge? No   Dietary orders reviewed? Yes  Do you have support at home? Yes   Other (ie: DME, Home Health, etc): N/A  Functional Questionnaire: (I = Independent and D = Dependent)  Bathing/Dressing- I   Meal Prep- I  Eating- I  Maintaining continence- Has trouble with urine leakage at night.   Transferring/Ambulation- I  Managing Meds- I   Follow up appointments reviewed:    PCP Hospital f/u appt confirmed? Yes , scheduled to see Dr [name] on 03/29/20 @ 3:00 PM.  [facility] f/u appt confirmed? Yes    Are transportation arrangements needed? No   If their condition worsens, is the pt aware to call  their PCP or go to the ED? Yes  Was the patient provided with contact information for the PCP's office or ED? Yes  Was the pt encouraged to call back with questions or concerns? Yes

## 2020-03-18 ENCOUNTER — Other Ambulatory Visit: Payer: Self-pay | Admitting: Family Medicine

## 2020-03-25 ENCOUNTER — Other Ambulatory Visit: Payer: Self-pay | Admitting: Family Medicine

## 2020-03-25 DIAGNOSIS — M5410 Radiculopathy, site unspecified: Secondary | ICD-10-CM

## 2020-03-25 NOTE — Telephone Encounter (Signed)
Medication: oxyCODONE-acetaminophen (PERCOCET) 10-325 MG tablet [[id number]]   Has the patient contacted their pharmacy? yes (Agent: If no, request that the patient contact the pharmacy for the refill.) (Agent: If yes, when and what did the pharmacy advise?)  Preferred Pharmacy (with phone number or street name): [place], [place]  Phone:  [phone] Fax:  [phone]     Agent: Please be advised that RX refills may take up to 3 business days. We ask that you follow-up with your pharmacy.

## 2020-03-25 NOTE — Telephone Encounter (Signed)
Requested medication (s) are due for refill today: yes  Requested medication (s) are on the active medication list: yes  Last refill:  02/26/20  Future visit scheduled: yes  Notes to clinic:  medication not delegated to NT to RF   Requested Prescriptions  Pending Prescriptions Disp Refills   oxyCODONE-acetaminophen (PERCOCET) 10-325 MG tablet 150 tablet 0    Sig: TAKE 1 TABLET BY MOUTH EVERY 4 HOURS      Not Delegated - Analgesics:  Opioid Agonist Combinations Failed - 03/25/2020 12:55 PM      Failed - This refill cannot be delegated      Passed - Urine Drug Screen completed in last 360 days.      Passed - Valid encounter within last 6 months    Recent Outpatient Visits           2 months ago Type 2 diabetes mellitus with diabetic nephropathy, without long-term current use of insulin ([place])   [facility] Birdie Sons, MD   5 months ago Type 2 diabetes mellitus with diabetic nephropathy, without long-term current use of insulin [facility])   [facility] Birdie Sons, MD   9 months ago Angina pectoris [facility])   [facility] Birdie Sons, MD   10 months ago Type 2 diabetes mellitus with diabetic nephropathy, without long-term current use of insulin [facility])   [facility] Birdie Sons, MD   1 year ago Insomnia, unspecified type   [facility] Birdie Sons, MD       Future Appointments             In 4 days [name], [name], MD [facility], [place]   In 1 month [name], [name], MD [facility], [place]

## 2020-03-26 MED ORDER — OXYCODONE-ACETAMINOPHEN 10-325 MG PO TABS: ORAL_TABLET | ORAL | 0 refills | Status: DC

## 2020-03-28 NOTE — Progress Notes (Signed)
Established patient visit   Patient: Lawrence [name]   DOB: Mar 18, 1956   64 y.o. Male  MRN: [id number] Visit Date: 03/29/2020  Today's healthcare provider: [name], MD   Chief Complaint  Patient presents with  . Hospitalization Follow-up   [employer] as a scribe for [name], MD.,have documented all relevant documentation on the behalf of [name], MD,as directed by  [name], MD while in the presence of [name], MD.  Subjective    HPI Follow up Hospitalization  Patient was admitted to [facility] on 03/07/2020 and discharged on 03/11/2020. He was treated for NSTEMI. Treatment for this included no medication changes. Telephone follow up was done on 03/12/2020 He reports good compliance with treatment. He reports this condition is slightly improved, still experiencing mild chest pains.  State he has been having more anxiety and difficulty sleeping. Is taking trazodone   He has decided to stop going to Dr. [name] and Is scheduled to establish with cardiologist in [place] in a few weeks.  Not having chest pain.  + dyspnea when walking.  Only complaint today is that he continue to have trouble sleeping, is only getting about 3-4 hours a night. Has been taking 140m trazodone which has not helped. He is considering establishing with another pulmonologist. Is using inhalers consistently every day.   --------------------------------------------------------------------------------------------------      Medications: Outpatient Medications Prior to Visit  Medication Sig  . Albuterol Sulfate, sensor, 108 (90 Base) MCG/ACT AEPB Inhale 2 puffs into the lungs at bedtime. AND PRN  . allopurinol (ZYLOPRIM) 300 MG tablet TAKE 1 TABLET BY MOUTH TWICE A DAY  . ALPRAZolam (XANAX) 1 MG tablet Take 1 tablet (1 mg total) by mouth 3 (three) times daily.  .Marland Kitchenaspirin 81 MG chewable tablet Chew 81 mg by mouth daily.  .Marland Kitchenatorvastatin (LIPITOR) 80 MG tablet  TAKE ONE TABLET BY MOUTH AT BEDTIME  . BREO ELLIPTA 100-25 MCG/INH AEPB INHALE 1 PUFF INTO THE LUNGS DAILY  . budesonide (PULMICORT) 0.5 MG/2ML nebulizer solution Take 0.5 mg by nebulization 2 (two) times daily.  . cetirizine (ZYRTEC) 10 MG tablet TAKE ONE TABLET BY MOUTH AT BEDTIME  . Cholecalciferol (VITAMIN D3) 125 MCG (5000 UT) CAPS Take 5,000 Units by mouth daily.  . dapagliflozin propanediol (FARXIGA) 10 MG TABS tablet Take 10 mg by mouth daily.  .Marland Kitchendocusate sodium (COLACE) 100 MG capsule Take 100 mg by mouth daily.   .Marland KitchenELIQUIS 5 MG TABS tablet TAKE 1 TABLET BY MOUTH TWICE A DAY  . isosorbide mononitrate (IMDUR) 120 MG 24 hr tablet TAKE 1 TABLET BY MOUTH DAILY  . LINZESS 72 MCG capsule Take 72 mcg by mouth daily before breakfast.   . lisinopril (ZESTRIL) 10 MG tablet Take 10 mg by mouth daily.  . magnesium oxide (MAG-OX) 400 (241.3 Mg) MG tablet Take 1 tablet (400 mg total) by mouth every morning.  . methocarbamol (ROBAXIN) 500 MG tablet TAKE 1 TABLET BY MOUTH 4 TIMES DAILY  . nitroGLYCERIN (NITROSTAT) 0.4 MG SL tablet Place 1 tablet (0.4 mg total) under the tongue every 5 (five) minutes as needed for chest pain. Reported on 05/26/2016  . omega-3 acid ethyl esters (LOVAZA) 1 g capsule Take 4 g by mouth daily.  .Marland Kitchenomeprazole (PRILOSEC) 40 MG capsule   . oxyCODONE-acetaminophen (PERCOCET) 10-325 MG tablet TAKE 1 TABLET BY MOUTH EVERY 4 HOURS  . ranolazine (RANEXA) 1000 MG SR tablet Take 1,000 mg by mouth 2 (two) times daily.  .Marland Kitchen  sotalol (BETAPACE) 80 MG tablet Take 80 mg by mouth 2 (two) times daily.  Marland Kitchen tiotropium (SPIRIVA) 18 MCG inhalation capsule Place 18 mcg into inhaler and inhale daily.  Marland Kitchen torsemide (DEMADEX) 100 MG tablet Take 50 mg by mouth daily.   . traZODone (DESYREL) 100 MG tablet Take 100 mg by mouth at bedtime.  Marland Kitchen venlafaxine XR (EFFEXOR-XR) 75 MG 24 hr capsule Take 75 mg by mouth daily with breakfast.   No facility-administered medications prior to visit.    Review of  Systems  Cardiovascular: Chest pain: mild.      Objective    BP 136/83 (BP Location: Right Arm, Patient Position: Sitting, Cuff Size: Normal)   Pulse 60   Temp (!) 97.1 F (36.2 C) (Temporal)   Wt 226 lb 6.4 oz (102.7 kg)   BMI 35.46 kg/m    Physical Exam   General: Appearance:    Obese male in no acute distress  Eyes:    PERRL, conjunctiva/corneas clear, EOM's intact       Lungs:     Clear to auscultation bilaterally, respirations unlabored  Heart:    Normal heart rate. Regular rhythm. No murmurs, rubs, or gallops.   MS:   All extremities are intact.   Neurologic:   Awake, alert, oriented x 3. No apparent focal neurological           defect.       No results found for any visits on 03/29/20.  Assessment & Plan    1. Insomnia, unspecified type Not improved on trazodone. Increase to - traZODone (DESYREL) 150 MG tablet; Take 1 tablet (150 mg total) by mouth at bedtime.  Dispense: 30 tablet; Refill: 3 Discussed trial of Belsomra if increased dose of trazodone is not effective.   2. Recurrent major depressive disorder, remission status unspecified ([place]) Continue current medications.    3. Coronary artery disease involving native coronary artery of native heart with other form of angina pectoris ([place]) Currently asymptomatic. He to follow up with cardiology in [facility] as scheduled.   4. Chronic diastolic CHF (congestive heart failure) (HCC) Continue current medications.    5. Chronic respiratory failure with hypoxia [facility]) He was followed by Dr. Humphrey Rolls. He is compliant with current inhalers. Is considering establishing with another pulmonologist.   Other orders - omeprazole (PRILOSEC) 40 MG capsule    No follow-ups on file.      The entirety of the information documented in the History of Present Illness, Review of Systems and Physical Exam were personally obtained by me. Portions of this information were initially documented by the CMA and reviewed by me for  thoroughness and accuracy.      [name], MD  [facility] [phone] (phone) [phone] (fax)  [place]

## 2020-03-29 ENCOUNTER — Other Ambulatory Visit: Payer: Self-pay

## 2020-03-29 ENCOUNTER — Ambulatory Visit (INDEPENDENT_AMBULATORY_CARE_PROVIDER_SITE_OTHER): Payer: Medicare HMO | Admitting: Family Medicine

## 2020-03-29 ENCOUNTER — Encounter: Payer: Self-pay | Admitting: Family Medicine

## 2020-03-29 VITALS — BP 136/83 | HR 60 | Temp 97.1°F | Wt 226.4 lb

## 2020-03-29 DIAGNOSIS — F339 Major depressive disorder, recurrent, unspecified: Secondary | ICD-10-CM | POA: Insufficient documentation

## 2020-03-29 DIAGNOSIS — I5032 Chronic diastolic (congestive) heart failure: Secondary | ICD-10-CM

## 2020-03-29 DIAGNOSIS — J9611 Chronic respiratory failure with hypoxia: Secondary | ICD-10-CM | POA: Diagnosis not present

## 2020-03-29 DIAGNOSIS — I25118 Atherosclerotic heart disease of native coronary artery with other forms of angina pectoris: Secondary | ICD-10-CM

## 2020-03-29 DIAGNOSIS — G4700 Insomnia, unspecified: Secondary | ICD-10-CM

## 2020-03-29 MED ORDER — TRAZODONE HCL 150 MG PO TABS: 150.0000 mg | ORAL_TABLET | Freq: Every day | ORAL | 3 refills | Status: DC

## 2020-03-29 NOTE — Patient Instructions (Signed)
.   Please review the attached list of medications and notify my office if there are any errors.   . Please bring all of your medications to every appointment so we can make sure that our medication list is the same as yours.   

## 2020-04-04 ENCOUNTER — Other Ambulatory Visit: Payer: Self-pay | Admitting: Family Medicine

## 2020-04-04 DIAGNOSIS — F419 Anxiety disorder, unspecified: Secondary | ICD-10-CM

## 2020-04-04 NOTE — Telephone Encounter (Signed)
Requested medication (s) are due for refill today: yes  Requested medication (s) are on the active medication list: yes  Last refill:  01/16/2020  Future visit scheduled: yes  Notes to clinic:  medication was filled by a historical provider Review for refill   Requested Prescriptions  Pending Prescriptions Disp Refills   venlafaxine XR (EFFEXOR-XR) 75 MG 24 hr capsule [Pharmacy Med Name: VENLAFAXINE HCL ER 75 MG CAP] 90 capsule     Sig: TAKE 1 CAPSULE BY MOUTH DAILY WITH BREAKFAST      Psychiatry: Antidepressants - SNRI - desvenlafaxine & venlafaxine Failed - 04/04/2020  1:20 PM      Failed - Triglycerides in normal range and within 360 days    Triglycerides  Date Value Ref Range Status  03/08/2020 514 (H) <150 mg/dL Final  05/23/2014 109 0 - 200 mg/dL Final          Passed - LDL in normal range and within 360 days    Ldl Cholesterol, Calc  Date Value Ref Range Status  05/23/2014 42 0 - 100 mg/dL Final   LDL Calculated  Date Value Ref Range Status  01/10/2018 56 0 - 99 mg/dL Final   LDL Cholesterol  Date Value Ref Range Status  03/08/2020 UNABLE TO CALCULATE IF TRIGLYCERIDE OVER 400 mg/dL 0 - 99 mg/dL Final    Comment:           Total Cholesterol/HDL:CHD Risk Coronary Heart Disease Risk Table                     Men   Women  1/2 Average Risk   3.4   3.3  Average Risk       5.0   4.4  2 X Average Risk   9.6   7.1  3 X Average Risk  23.4   11.0        Use the calculated Patient Ratio above and the CHD Risk Table to determine the patient's CHD Risk.        ATP III CLASSIFICATION (LDL):  <100     mg/dL   Optimal  100-129  mg/dL   Near or Above                    Optimal  130-159  mg/dL   Borderline  160-189  mg/dL   High  >190     mg/dL   Very High Performed at [facility], [address] [place], [place] [address]    Direct LDL  Date Value Ref Range Status  03/08/2020 84.1 0 - 99 mg/dL Final    Comment:    Performed at [facility] [address] [place], [place] [address]          Passed - Total Cholesterol in normal range and within 360 days    Cholesterol, Total  Date Value Ref Range Status  01/10/2018 151 100 - 199 mg/dL Final   Cholesterol  Date Value Ref Range Status  03/08/2020 170 0 - 200 mg/dL Final  05/23/2014 92 0 - 200 mg/dL Final          Passed - Completed PHQ-2 or PHQ-9 in the last 360 days.      Passed - Last BP in normal range    BP Readings from Last 1 Encounters:  03/29/20 136/83          Passed - Valid encounter within last 6 months    Recent Outpatient  Visits           6 days ago Insomnia, unspecified type   [facility] Birdie Sons, MD   3 months ago Type 2 diabetes mellitus with diabetic nephropathy, without long-term current use of insulin [facility])   [facility] Birdie Sons, MD   6 months ago Type 2 diabetes mellitus with diabetic nephropathy, without long-term current use of insulin [facility])   [facility] Birdie Sons, MD   9 months ago Angina pectoris [facility])   [facility] Birdie Sons, MD   10 months ago Type 2 diabetes mellitus with diabetic nephropathy, without long-term current use of insulin [facility])   [facility] Birdie Sons, MD       Future Appointments             In 1 month [name], [name], MD [facility], [name]

## 2020-04-04 NOTE — Telephone Encounter (Signed)
Requested medication (s) are due for refill today - unknown  Requested medication (s) are on the active medication list -yes  Future visit scheduled -yes  Last refill: 01/16/20  Notes to clinic: Request for non delegated Rx  Requested Prescriptions  Pending Prescriptions Disp Refills   methocarbamol (ROBAXIN) 500 MG tablet [Pharmacy Med Name: METHOCARBAMOL 500 MG TAB] 60 tablet 3    Sig: TAKE 1 TABLET BY MOUTH 4 TIMES DAILY      Not Delegated - Analgesics:  Muscle Relaxants Failed - 04/04/2020  1:20 PM      Failed - This refill cannot be delegated      Passed - Valid encounter within last 6 months    Recent Outpatient Visits           6 days ago Insomnia, unspecified type   [facility] Birdie Sons, MD   3 months ago Type 2 diabetes mellitus with diabetic nephropathy, without long-term current use of insulin ([place])   [facility] Birdie Sons, MD   6 months ago Type 2 diabetes mellitus with diabetic nephropathy, without long-term current use of insulin ([place])   [facility] Birdie Sons, MD   9 months ago Angina pectoris [facility])   [facility] Birdie Sons, MD   10 months ago Type 2 diabetes mellitus with diabetic nephropathy, without long-term current use of insulin ([place])   [place], [name], MD       Future Appointments             In 1 month [name], [name], MD [facility] [place]                Requested Prescriptions  Pending Prescriptions Disp Refills   methocarbamol (ROBAXIN) 500 MG tablet [Pharmacy Med Name: METHOCARBAMOL 500 MG TAB] 60 tablet 3    Sig: TAKE 1 TABLET BY MOUTH 4 TIMES DAILY      Not Delegated - Analgesics:  Muscle Relaxants Failed - 04/04/2020  1:20 PM      Failed - This refill cannot be delegated      Passed - Valid encounter within last 6 months    Recent Outpatient Visits           6 days ago Insomnia, unspecified type   [facility] Birdie Sons, MD   3 months ago Type 2 diabetes mellitus with diabetic nephropathy, without long-term current use of insulin ([place])   [facility] Birdie Sons, MD   6 months ago Type 2 diabetes mellitus with diabetic nephropathy, without long-term current use of insulin [facility])   [facility] Birdie Sons, MD   9 months ago Angina pectoris [facility])   [facility] Birdie Sons, MD   10 months ago Type 2 diabetes mellitus with diabetic nephropathy, without long-term current use of insulin [facility])   [facility] Birdie Sons, MD       Future Appointments             In 1 month [name], [name], MD [name]

## 2020-04-06 IMAGING — CT CT ABD-PELV W/ CM
2 of 5 series · 16 of 46 positions shown, 18 images · IV contrast (APPLIED)
Comparison: 10/27/2017 CT abdomen and pelvis.

CLINICAL DATA: 62 y/o M; lower abdominal pain with emesis over the
past 24 hours.

EXAM:
CT ABDOMEN AND PELVIS WITH CONTRAST
TECHNIQUE: Multidetector CT imaging of the abdomen and pelvis was performed
using the standard protocol following bolus administration of
intravenous contrast.
CONTRAST:  125mL UZAKRJ-9TT IOPAMIDOL (UZAKRJ-9TT) INJECTION 61%

[Series 2: axial st · axial · 0.96mm/px · z∈[-1015,-595]mm · 13 of 94 slices shown, 15 images]
[im 5/94  soft-tissue]
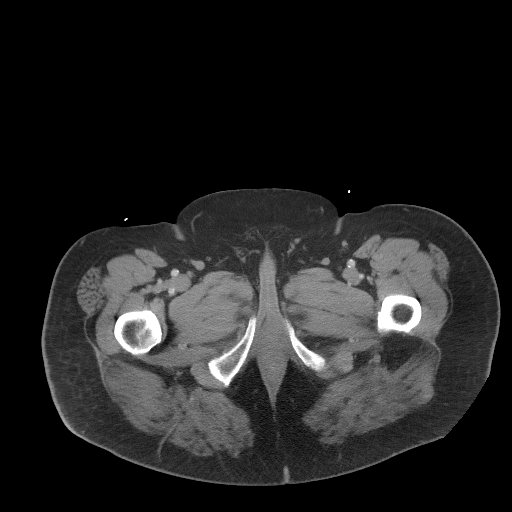
[im 5/94  bone]
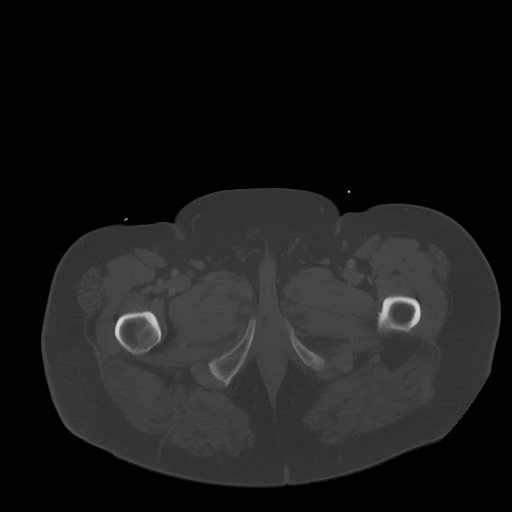
[im 15/94  soft-tissue]
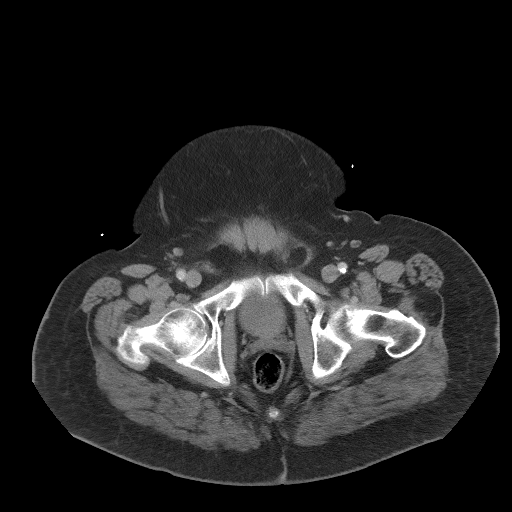
[im 20/94  soft-tissue]
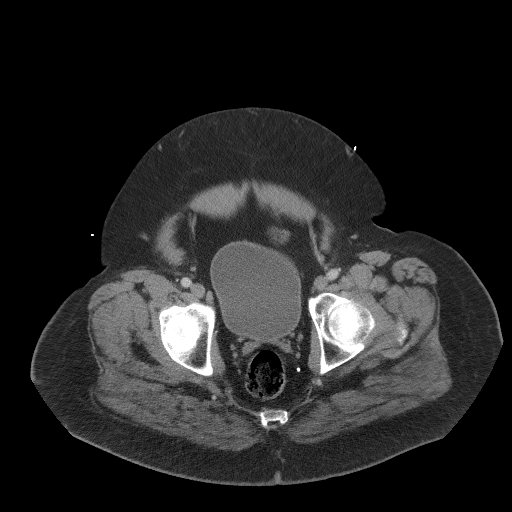
[im 25/94  soft-tissue]
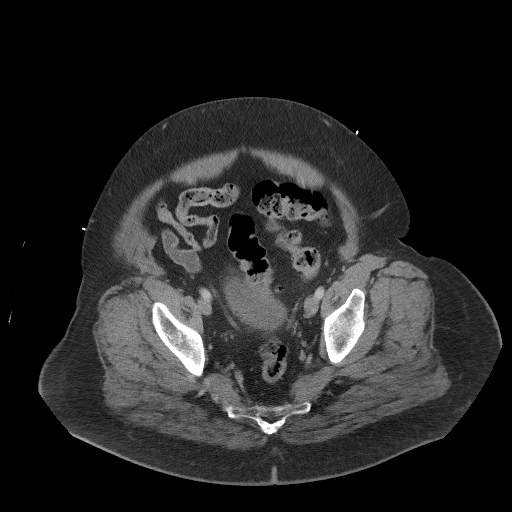
[im 35/94  soft-tissue]
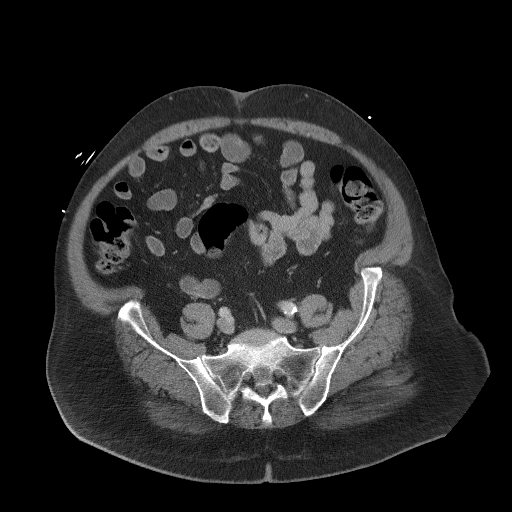
[im 40/94  soft-tissue]
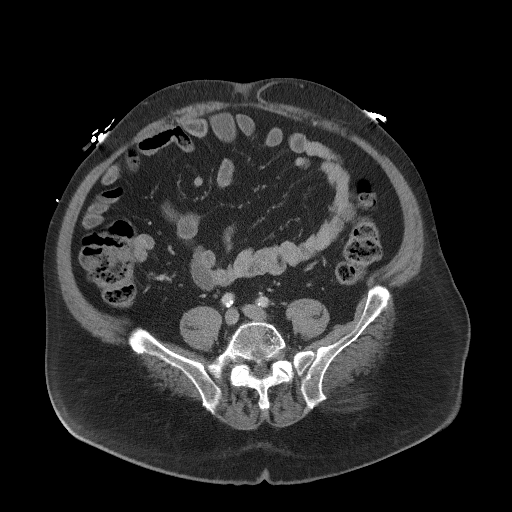
[im 49/94  soft-tissue]
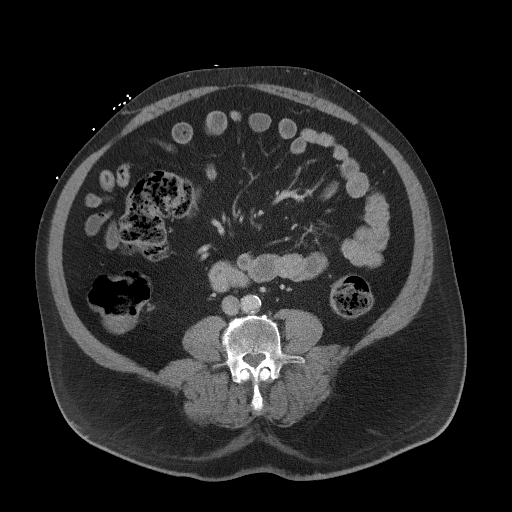
[im 54/94  soft-tissue]
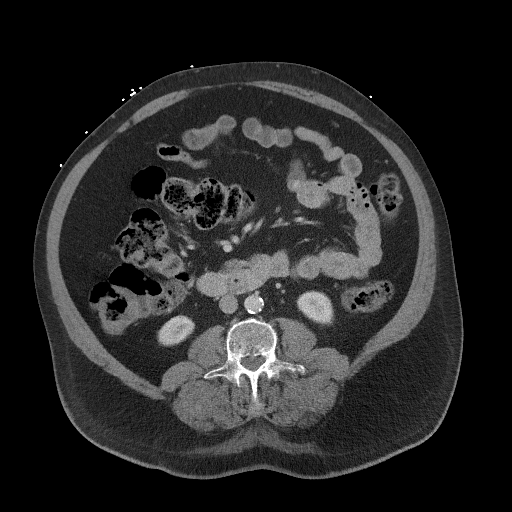
[im 59/94  soft-tissue]
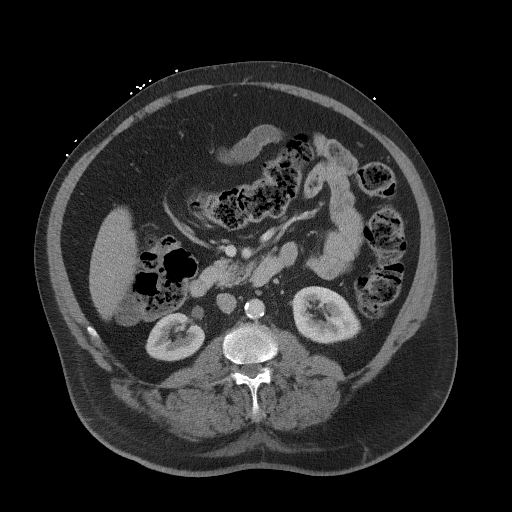
[im 59/94  bone]
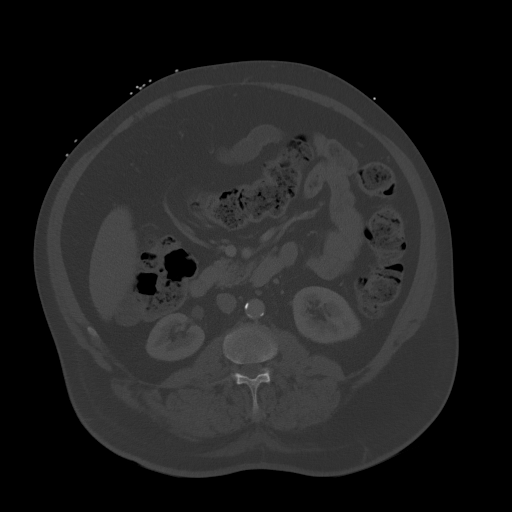
[im 69/94  soft-tissue]
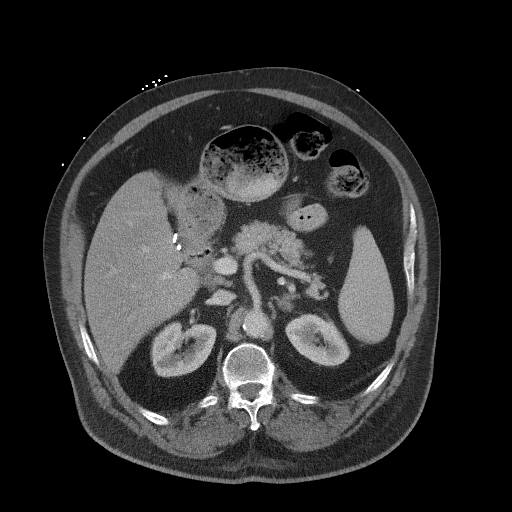
[im 74/94  soft-tissue]
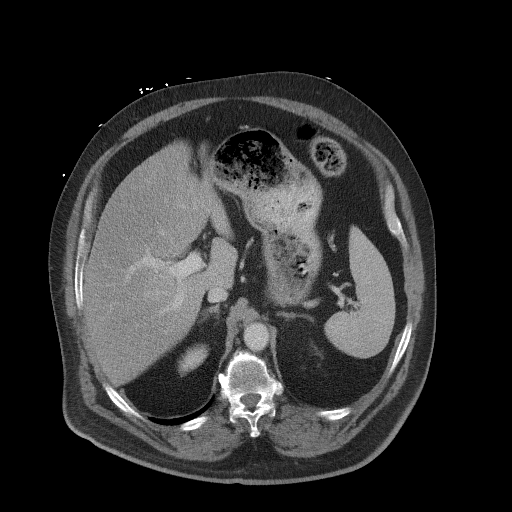
[im 79/94  soft-tissue]
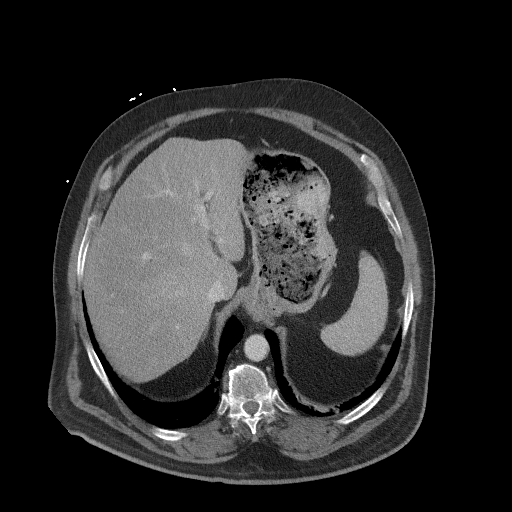
[im 89/94  soft-tissue]
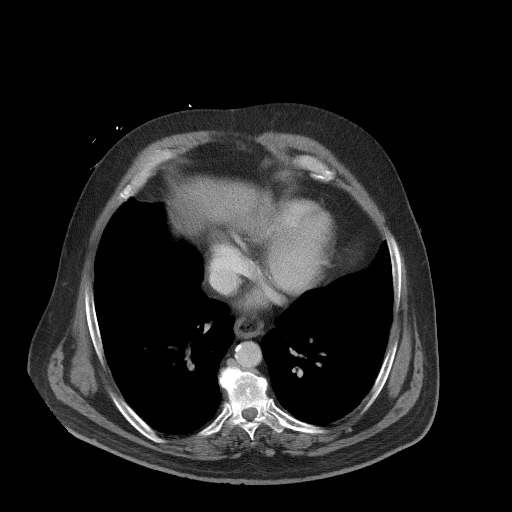

[Series 5: coronal st · coronal · 0.80mm/px · 3 of 125 slices shown]
[im 42/125  soft-tissue]
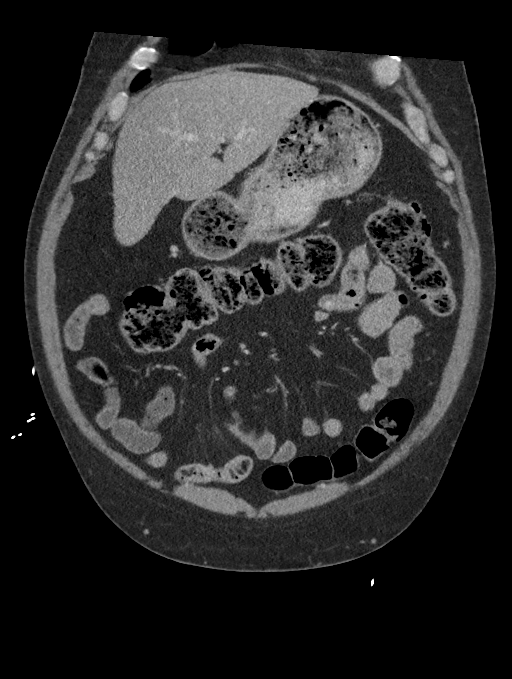
[im 56/125  soft-tissue]
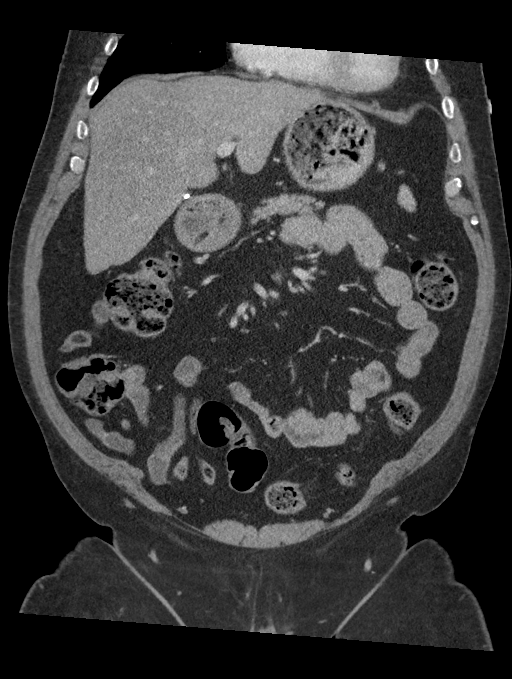
[im 69/125  soft-tissue]
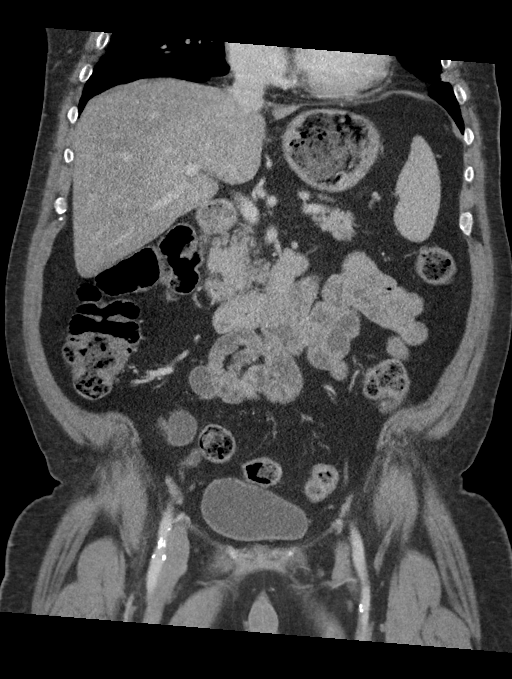

[16 of 46 positions shown; findings below may reference images not displayed]

FINDINGS: Lower chest: No acute abnormality.

Hepatobiliary: Hepatic steatosis. No focal liver abnormality is
seen. Status post cholecystectomy. No biliary dilatation.

Pancreas: Unremarkable. No pancreatic ductal dilatation or
surrounding inflammatory changes.

Spleen: Normal in size without focal abnormality.

Adrenals/Urinary Tract: Stable adrenal nodular hypertrophy. Kidneys
are normal, without renal calculi, focal lesion, or hydronephrosis.
Bladder is unremarkable.

Stomach/Bowel: Mild distal esophageal dilatation and wall
thickening. Debris within the lower esophagus. Normal appearance of
the stomach. Small stable superiorly directed duodenum diverticulum
arising from the third segment of duodenum. No obstructive or
inflammatory changes of jejunum, ileum, or colon. Mild sigmoid
diverticulosis, no findings of acute diverticulitis.

Vascular/Lymphatic: Aortic atherosclerosis. No enlarged abdominal or
pelvic lymph nodes.

Reproductive: Prostate is unremarkable.

Other: Stable small left inguinal hernia containing fat.

Musculoskeletal: No fracture is seen. Mild lumbar spondylosis with
prominent facet arthropathy.
IMPRESSION: 1. Mild distention of the distal esophagus containing debris. Stable
mild wall thickening of distal esophagus.
2. Hepatic steatosis.
3. Stable adrenal nodular hypertrophy.
4. Aortic atherosclerosis.
5. Stable small left inguinal hernia containing fat.

By: Kanwal Calva M.D.

## 2020-04-08 ENCOUNTER — Telehealth: Payer: Self-pay

## 2020-04-08 DIAGNOSIS — D229 Melanocytic nevi, unspecified: Secondary | ICD-10-CM

## 2020-04-08 NOTE — Telephone Encounter (Signed)
Copied from [place] [id number]. Topic: Referral - Request for Referral >> Apr 08, 2020 12:54 PM Scherrie Gerlach wrote: Has patient seen PCP for this complaint?pt states yes  Referral for which specialty: dermatology Preferred provider/office: any Reason for referral:psoriasis   Pt states Dr [name] referred him another time, but he could not make the appt because wife was sick

## 2020-04-10 IMAGING — RF DG UGI W/ KUB
9 of 16 series · 12 of 22 positions shown · non-contrast
Comparison: None.

CLINICAL DATA: Dysphagia.

EXAM:
UPPER GI SERIES WITH KUB
TECHNIQUE: After obtaining a scout radiograph a routine upper GI series was
performed using thin and high density barium.
FLUOROSCOPY TIME:  Fluoroscopy Time:  1 minutes 6 seconds
Radiation Exposure Index (if provided by the fluoroscopic device):
28.9 mGy
Number of Acquired Spot Images: 0

[Series 1: t abdomen supine · 0.15mm/px · 1 of 1 slices shown]
[im 1/1]
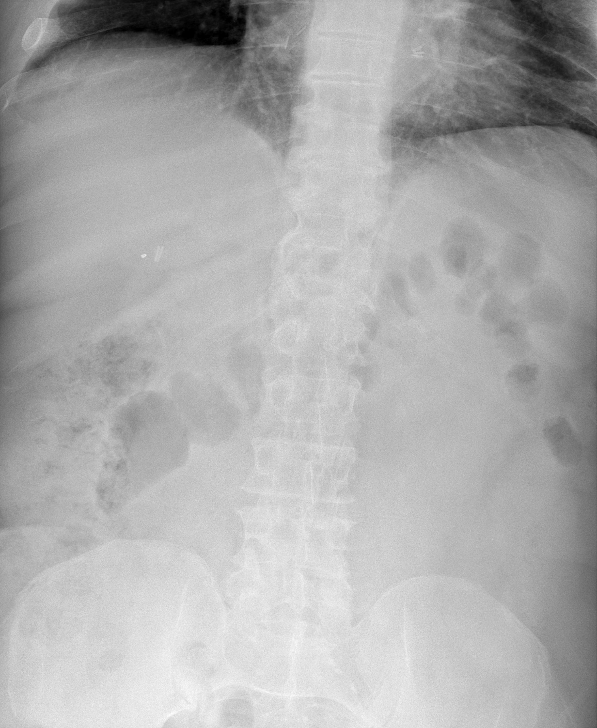

[Series 2: cp_standard · 0.52mm/px · 2 of 76 frames shown (1 of 7)]
[frame 39/76]
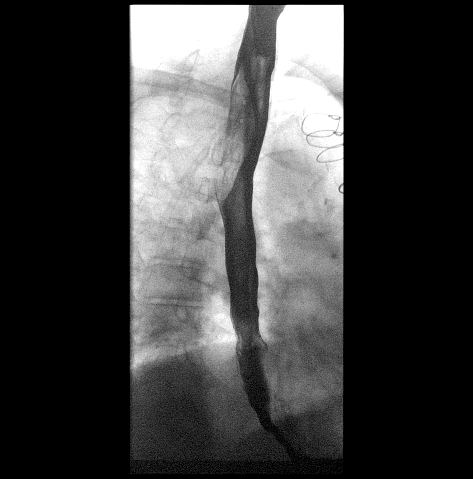
[frame 65/76]
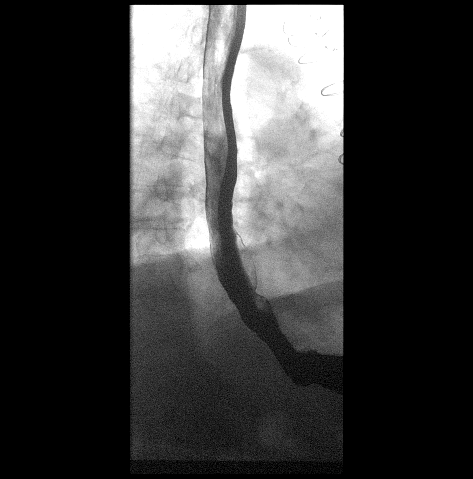

[Series 4: cp_standard · 0.26mm/px · 1 of 1 slices shown (2 of 7)]
[im 1/1]
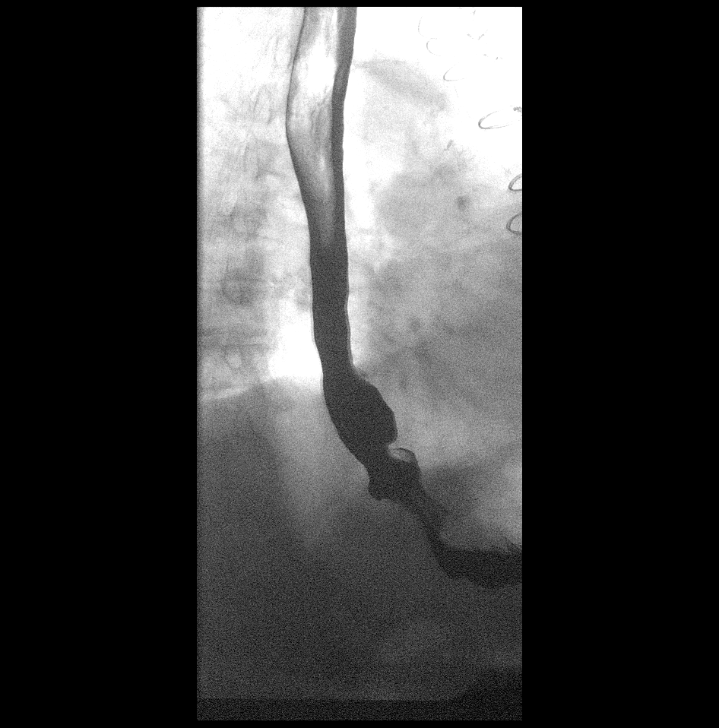

[Series 6: cp_standard · 0.52mm/px · 3 of 23 frames shown (3 of 7)]
[frame 4/23]
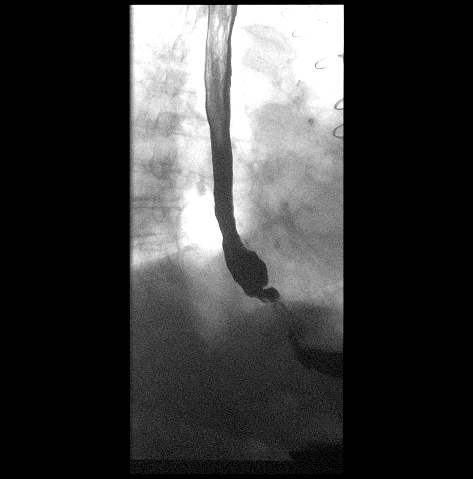
[frame 12/23]
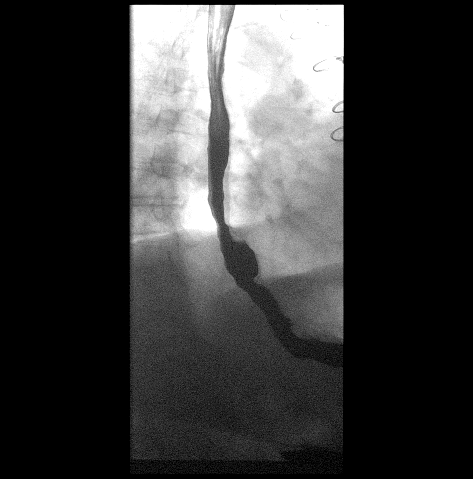
[frame 20/23]
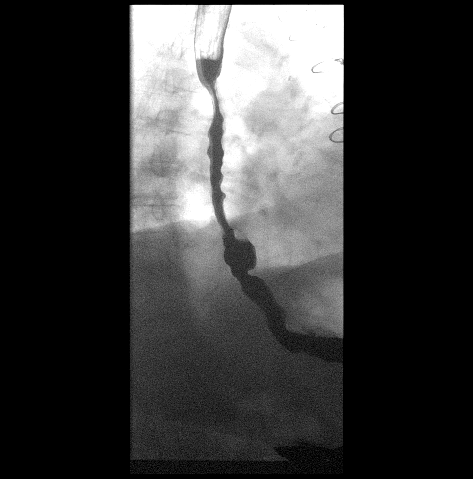

[Series 8: fluoro_barium swallow 2fps_bw · 0.19mm/px · 1 of 1 slices shown]
[im 1/1]
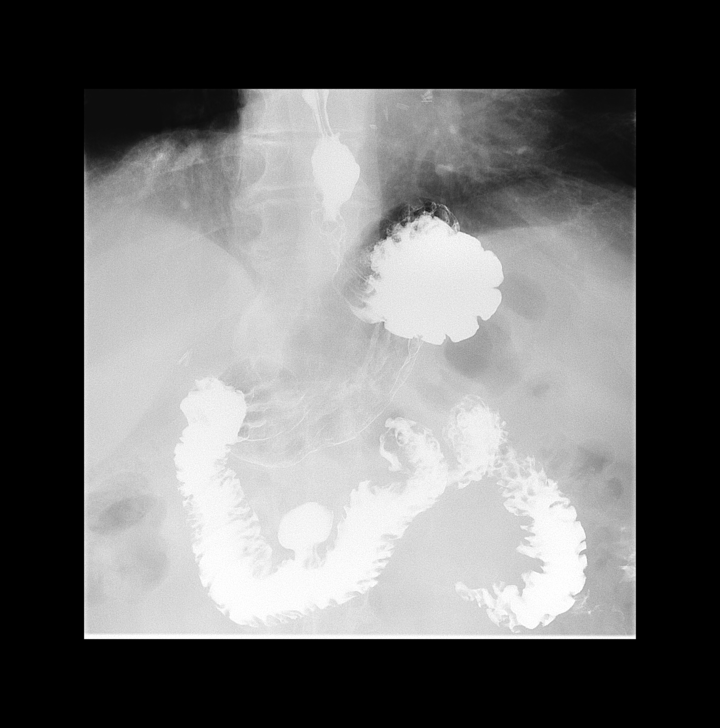

[Series 10: cp_standard · 0.29mm/px · 1 of 1 slices shown (4 of 7)]
[im 1/1]
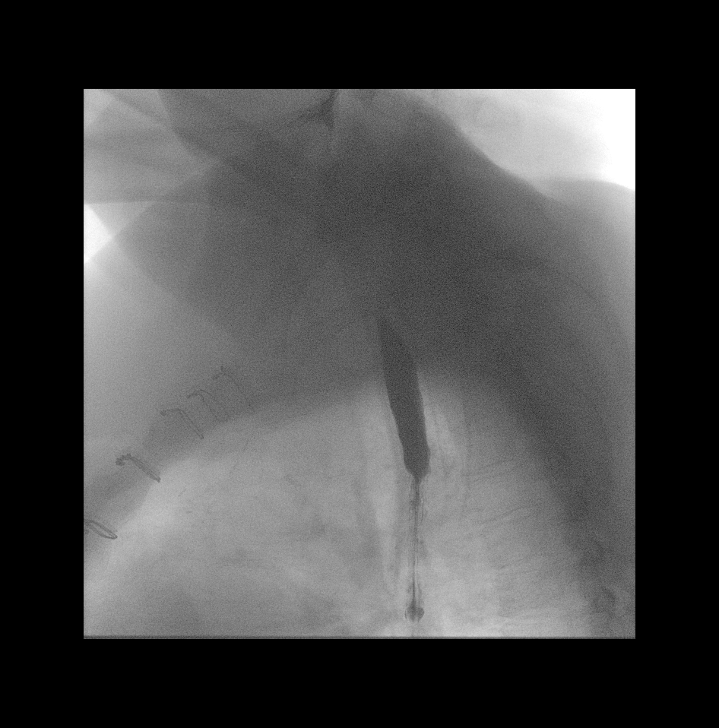

[Series 12: cp_standard · 0.29mm/px · 1 of 1 slices shown (5 of 7)]
[im 1/1]
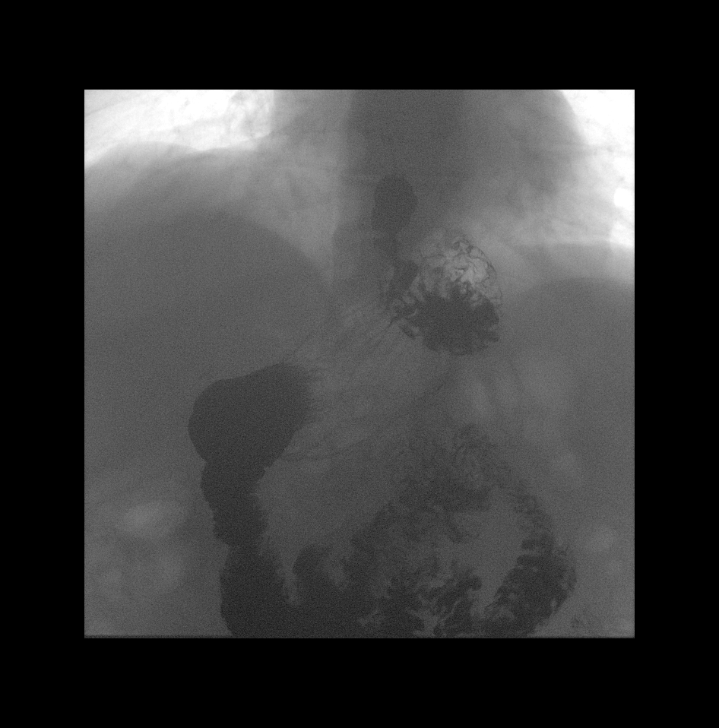

[Series 14: cp_standard · 0.29mm/px · 1 of 1 slices shown (6 of 7)]
[im 1/1]
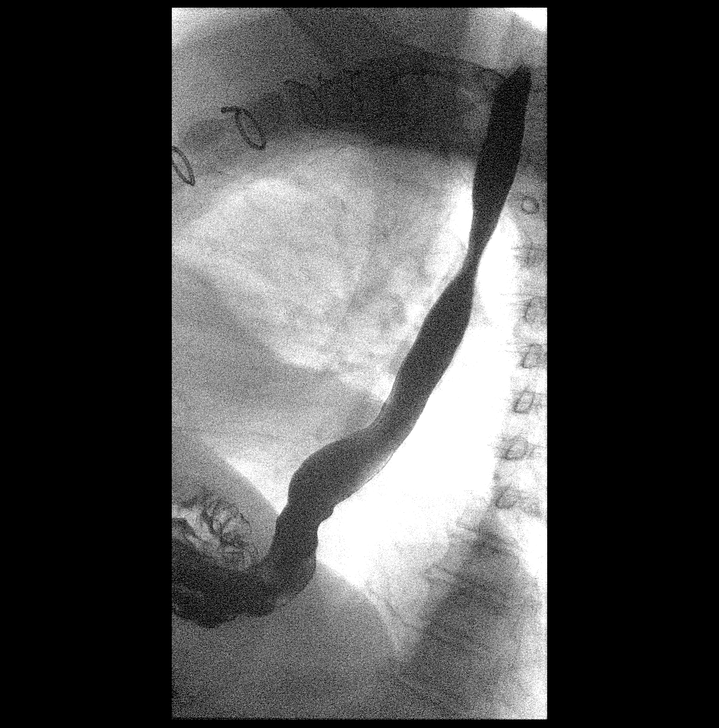

[Series 16: cp_standard · 0.29mm/px · 1 of 1 slices shown (7 of 7)]
[im 1/1]
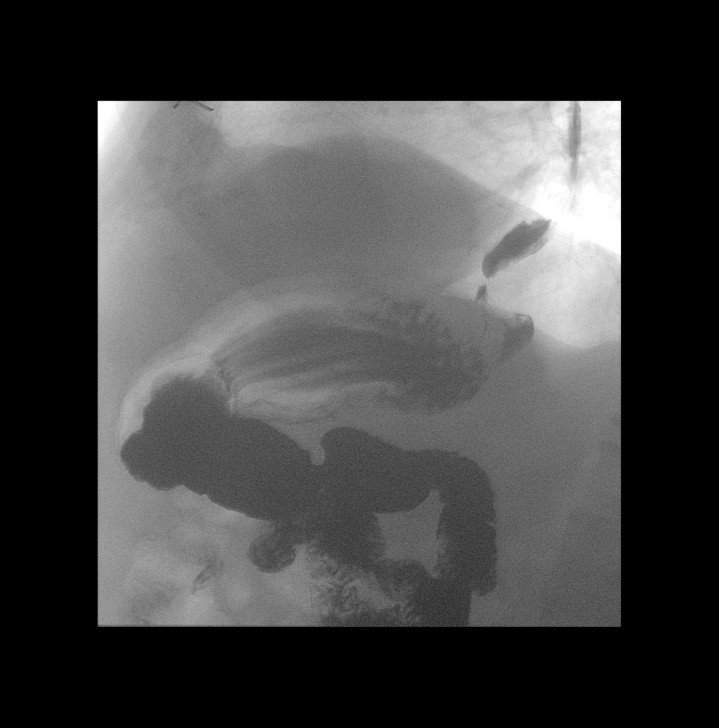

[12 of 22 positions shown; findings below may reference images not displayed]

FINDINGS: Examination of the esophagus demonstrated normal esophageal
motility. Normal esophageal morphology without evidence of
esophagitis or ulceration. No esophageal stricture diverticula, or
mass lesion. Small hiatal hernia. Moderate-severe gastroesophageal
reflux extending to the cervical esophagus.

Examination of the stomach demonstrated mild thickening of the rugal
folds and areae gastricae as can be seen with mild gastritis. The
gastric mucosa appeared unremarkable without evidence of ulceration,
scarring, or mass lesion. Gastric motility and emptying was normal.
Fluoroscopic examination of the duodenum demonstrates normal
motility and morphology without evidence of ulceration or mass
lesion.
IMPRESSION: 1. Small hiatal hernia.
2. Moderate-severe gastroesophageal reflux extending to the level of
the cervical esophagus.
3. Mild gastric fold thickening as can be seen with mild gastritis.

## 2020-04-24 ENCOUNTER — Other Ambulatory Visit: Payer: Self-pay | Admitting: Family Medicine

## 2020-04-24 DIAGNOSIS — M5410 Radiculopathy, site unspecified: Secondary | ICD-10-CM

## 2020-04-24 NOTE — Telephone Encounter (Signed)
Medication Refill - Medication:  oxyCODONE-acetaminophen (PERCOCET) 10-325 MG tablet [[id number]]   Has the patient contacted their pharmacy? No. (Agent: If no, request that the patient contact the pharmacy for the refill.) (Agent: If yes, when and what did the pharmacy advise?)  Preferred Pharmacy (with phone number or street name): [facility]   Agent: Please be advised that RX refills may take up to 3 business days. We ask that you follow-up with your pharmacy.

## 2020-04-24 NOTE — Telephone Encounter (Signed)
Medication pending for PCP review

## 2020-04-24 NOTE — Telephone Encounter (Signed)
Requested medication (s) are due for refill today: yes  Requested medication (s) are on the active medication list: yes  Last refill:  03/26/20  Future visit scheduled: yes  Notes to clinic: not delegated   Requested Prescriptions  Pending Prescriptions Disp Refills   oxyCODONE-acetaminophen (PERCOCET) 10-325 MG tablet 150 tablet 0    Sig: TAKE 1 TABLET BY MOUTH EVERY 4 HOURS      Not Delegated - Analgesics:  Opioid Agonist Combinations Failed - 04/24/2020  9:22 AM      Failed - This refill cannot be delegated      Passed - Urine Drug Screen completed in last 360 days.      Passed - Valid encounter within last 6 months    Recent Outpatient Visits           3 weeks ago Insomnia, unspecified type   [facility] Birdie Sons, MD   3 months ago Type 2 diabetes mellitus with diabetic nephropathy, without long-term current use of insulin ([place])   [facility] Birdie Sons, MD   6 months ago Type 2 diabetes mellitus with diabetic nephropathy, without long-term current use of insulin [facility])   [facility] Birdie Sons, MD   10 months ago Angina pectoris [facility])   [facility] Birdie Sons, MD   11 months ago Type 2 diabetes mellitus with diabetic nephropathy, without long-term current use of insulin [facility])   [facility] Birdie Sons, MD       Future Appointments             In 2 weeks [name], [name], MD [facility], [place]   In 3 months Ralene Bathe, MD [place]

## 2020-04-24 NOTE — Telephone Encounter (Signed)
Duplicate request

## 2020-04-30 ENCOUNTER — Ambulatory Visit: Payer: Medicare HMO | Admitting: Family Medicine

## 2020-05-01 ENCOUNTER — Other Ambulatory Visit: Payer: Self-pay | Admitting: Family Medicine

## 2020-05-01 ENCOUNTER — Other Ambulatory Visit: Payer: Self-pay

## 2020-05-01 MED ORDER — CETIRIZINE HCL 10 MG PO TABS: 10.0000 mg | ORAL_TABLET | Freq: Every day | ORAL | 1 refills | Status: DC

## 2020-05-01 NOTE — Telephone Encounter (Signed)
Refill request received from [facility]

## 2020-05-01 NOTE — Progress Notes (Signed)
Pt cancelled AWV. This encounter was created in error - please disregard.

## 2020-05-02 ENCOUNTER — Other Ambulatory Visit: Payer: Self-pay

## 2020-05-02 ENCOUNTER — Other Ambulatory Visit: Payer: Self-pay | Admitting: Family Medicine

## 2020-05-02 NOTE — Telephone Encounter (Signed)
Requested medication (s) are due for refill today: no  Requested medication (s) are on the active medication list: yes  Last refill:  02/05/2020  Future visit scheduled: yes  Notes to clinic:one inhaler should last at least one month. Review for refill    Requested Prescriptions  Pending Prescriptions Disp Refills   albuterol (VENTOLIN HFA) 108 (90 Base) MCG/ACT inhaler [Pharmacy Med Name: ALBUTEROL SULFATE HFA 108 (90 BASE)] 17 g     Sig: INHALE 2 PUFFS BY MOUTH EVERY 6 HOURS AS NEEDED FOR SHORTNESS OF BREATH      Pulmonology:  Beta Agonists Failed - 05/02/2020  8:07 AM      Failed - One inhaler should last at least one month. If the patient is requesting refills earlier, contact the patient to check for uncontrolled symptoms.      Passed - Valid encounter within last 12 months    Recent Outpatient Visits           1 month ago Insomnia, unspecified type   [facility] Birdie Sons, MD   4 months ago Type 2 diabetes mellitus with diabetic nephropathy, without long-term current use of insulin ([place])   [facility] Birdie Sons, MD   7 months ago Type 2 diabetes mellitus with diabetic nephropathy, without long-term current use of insulin [facility])   [facility] Birdie Sons, MD   10 months ago Angina pectoris [facility])   [facility] Birdie Sons, MD   11 months ago Type 2 diabetes mellitus with diabetic nephropathy, without long-term current use of insulin [facility])   [facility] Birdie Sons, MD       Future Appointments             In 1 week [name], [name], MD [facility], [place]   In 2 months Ralene Bathe, MD [place]

## 2020-05-08 NOTE — Progress Notes (Signed)
Subjective:   Lawrence [name] is a 64 y.o. male who presents for Medicare Annual/Subsequent preventive examination.  I connected with Lawrence [name] today by telephone and verified that I am speaking with the correct person using two identifiers. Location patient: home Location provider: work Persons participating in the virtual visit: patient, provider.   I discussed the limitations, risks, security and privacy concerns of performing an evaluation and management service by telephone and the availability of in person appointments. I also discussed with the patient that there may be a patient responsible charge related to this service. The patient expressed understanding and verbally consented to this telephonic visit.    Interactive audio and video telecommunications were attempted between this provider and patient, however failed, due to patient having technical difficulties OR patient did not have access to video capability.  We continued and completed visit with audio only.   Review of Systems    N/A  Cardiac Risk Factors include: advanced age (>1mn, >>15women);diabetes mellitus;hypertension;male gender;dyslipidemia     Objective:    Today's Vitals   05/09/20 1324  PainSc: 8    There is no height or weight on file to calculate BMI.  Advanced Directives 05/09/2020 03/11/2020 03/08/2020 03/07/2020 10/03/2019 09/09/2019 09/08/2019  Does Patient Have a Medical Advance Directive? No No No No No No No  Type of Advance Directive - - - - - - -  Does patient want to make changes to medical advance directive? - - - - - - -  Copy of HFrohnain Chart? - - - - - - -  Would patient like information on creating a medical advance directive? Yes (MAU/Ambulatory/Procedural Areas - Information given) No - Patient declined No - Patient declined No - Patient declined No - Patient declined No - Patient declined No - Patient declined    Current Medications (verified) Outpatient  Encounter Medications as of 05/09/2020  Medication Sig  . Albuterol Sulfate, sensor, 108 (90 Base) MCG/ACT AEPB Inhale 2 puffs into the lungs at bedtime. AND PRN  . allopurinol (ZYLOPRIM) 300 MG tablet TAKE 1 TABLET BY MOUTH TWICE A DAY  . ALPRAZolam (XANAX) 1 MG tablet Take 1 tablet (1 mg total) by mouth 3 (three) times daily.  .Marland Kitchenaspirin 81 MG chewable tablet Chew 81 mg by mouth daily.  .Marland Kitchenatorvastatin (LIPITOR) 80 MG tablet TAKE ONE TABLET BY MOUTH AT BEDTIME  . BREO ELLIPTA 100-25 MCG/INH AEPB INHALE 1 PUFF INTO THE LUNGS DAILY  . budesonide (PULMICORT) 0.5 MG/2ML nebulizer solution Take 0.5 mg by nebulization 2 (two) times daily. As needed  . cetirizine (ZYRTEC) 10 MG tablet Take 1 tablet (10 mg total) by mouth at bedtime.  . dapagliflozin propanediol (FARXIGA) 10 MG TABS tablet Take 10 mg by mouth daily.  .Marland Kitchendocusate sodium (COLACE) 100 MG capsule Take 100 mg by mouth daily. regular OTC stool softner  . ELIQUIS 5 MG TABS tablet TAKE 1 TABLET BY MOUTH TWICE A DAY  . isosorbide mononitrate (IMDUR) 120 MG 24 hr tablet TAKE 1 TABLET BY MOUTH DAILY  . LINZESS 72 MCG capsule Take 72 mcg by mouth daily before breakfast.   . lisinopril (ZESTRIL) 10 MG tablet Take 10 mg by mouth daily.  . magnesium oxide (MAG-OX) 400 (241.3 Mg) MG tablet Take 1 tablet (400 mg total) by mouth every morning.  . methocarbamol (ROBAXIN) 500 MG tablet TAKE 1 TABLET BY MOUTH 4 TIMES DAILY  . nitroGLYCERIN (NITROSTAT) 0.4 MG SL tablet Place 1  tablet (0.4 mg total) under the tongue every 5 (five) minutes as needed for chest pain. Reported on 05/26/2016  . omega-3 acid ethyl esters (LOVAZA) 1 g capsule Take 4 g by mouth in the morning, at noon, in the evening, and at bedtime.   Marland Kitchen omeprazole (PRILOSEC) 40 MG capsule Take 40 mg by mouth daily.   Marland Kitchen oxyCODONE-acetaminophen (PERCOCET) 10-325 MG tablet TAKE 1 TABLET BY MOUTH EVERY 4 HOURS  . ranolazine (RANEXA) 1000 MG SR tablet Take 1,000 mg by mouth 2 (two) times daily.  . sotalol  (BETAPACE) 80 MG tablet Take 80 mg by mouth 2 (two) times daily.  Marland Kitchen tiotropium (SPIRIVA) 18 MCG inhalation capsule Place 18 mcg into inhaler and inhale daily.  Marland Kitchen torsemide (DEMADEX) 100 MG tablet Take 50 mg by mouth daily.   . traZODone (DESYREL) 150 MG tablet Take 1 tablet (150 mg total) by mouth at bedtime.  Marland Kitchen venlafaxine XR (EFFEXOR-XR) 75 MG 24 hr capsule TAKE 1 CAPSULE BY MOUTH DAILY WITH BREAKFAST  . Cholecalciferol (VITAMIN D3) 125 MCG (5000 UT) CAPS Take 5,000 Units by mouth daily. (Patient not taking: Reported on 05/09/2020)   No facility-administered encounter medications on file as of 05/09/2020.    Allergies (verified) Demerol  [meperidine hcl], Demerol [meperidine], Jardiance [empagliflozin], Prednisone, Sulfa antibiotics, Albuterol sulfate [albuterol], and Morphine sulfate   History: Past Medical History:  Diagnosis Date  . A-fib ([place])   . Anemia   . Anginal pain ([place])   . Anxiety   . Arthritis   . Asthma   . CAD (coronary artery disease)    a. 2002 CABGx2 (LIMA->LAD, VG->VG->OM1);  b. 09/2012 DES->OM;  c. 03/2015 PTCA of LAD [facility]) in setting of atretic LIMA; d. 05/2015 Cath [facility]): nonobs dzs; e. 06/2015 Cath ([facility]): LM nl, LAD 45p/d ISR, 50d, D1/2 small, LCX 50p/d ISR, OM1 70ost, 30 ISR, VG->OM1 50ost, 86m LIMA->LAD 99p/d - atretic, RCA dom, nl; f.cath 10/16: 40-50%(FFR 0.90) pLAD, 75% (FFR 0.77) mLAD s/p PCI/DES, oRCA 40% (FFR0.95)  . Cancer ([place]    SKIN CANCER ON BACK  . Celiac disease   . Chronic diastolic CHF (congestive heart failure) ([place]    a. 06/2009 Echo: EF 60-65%, Gr 1 DD, triv AI, mildly dil LA, nl RV.  .Marland KitchenCOPD (chronic obstructive pulmonary disease) ([place]    a. Chronic bronchitis and emphysema.  . DDD (degenerative disc disease), lumbar   . Diverticulosis   . Dysrhythmia   . Essential hypertension   . GERD (gastroesophageal reflux disease)   . History of hiatal hernia   . History of kidney stones    H/O  . History of tobacco abuse    a. Quit 2014.  .Marland Kitchen [place]ocardial infarction [place]   4 STENTS  . Pancreatitis   . PSVT (paroxysmal supraventricular tachycardia) ([place]    a. 10/2012 Noted on Zio Patch.  . Sleep apnea    LOST CORD TO CPAP -ONLY 02 @ BEDTIME  . Tubular adenoma of colon   . Type II diabetes mellitus ([place]    Past Surgical History:  Procedure Laterality Date  . BYPASS GRAFT    . CARDIAC CATHETERIZATION N/A 07/12/2015   rocedure: Left Heart Cath and Cors/Grafts Angiography;  Surgeon: HBelva Crome MD;  [facility] [place] [place];  Service: Cardiovascular;  Laterality: N/A;  . CARDIAC CATHETERIZATION Right 10/07/2015   Procedure: Left Heart Cath and Cors/Grafts Angiography;  Surgeon: SDionisio David MD;  [place];  Service: Cardiovascular;  Laterality: Right;  . CARDIAC CATHETERIZATION N/A 04/06/2016   Procedure: Left Heart Cath and Coronary Angiography;  Surgeon: Yolonda Kida, MD;  Location: [place] CV LAB;  Service: Cardiovascular;  Laterality: N/A;  . CARDIAC CATHETERIZATION  04/06/2016   Procedure: Bypass Graft Angiography;  Surgeon: Yolonda Kida, MD;  Location: [place] CV LAB;  Service: Cardiovascular;;  . CARDIAC CATHETERIZATION N/A 11/02/2016   Procedure: Left Heart Cath and Cors/Grafts Angiography and possible PCI;  Surgeon: Yolonda Kida, MD;  Location: [place] CV LAB;  Service: Cardiovascular;  Laterality: N/A;  . CARDIAC CATHETERIZATION N/A 11/02/2016   Procedure: Coronary Stent Intervention;  Surgeon: Yolonda Kida, MD;  Location: [place] CV LAB;  Service: Cardiovascular;  Laterality: N/A;  . CHOLECYSTECTOMY    . CIRCUMCISION N/A 06/09/2019   Procedure: CIRCUMCISION ADULT;  Surgeon: Billey Co, MD;  Location: [facility] ORS;  Service: Urology;  Laterality: N/A;  . COLONOSCOPY WITH PROPOFOL N/A 04/01/2018   Procedure: COLONOSCOPY WITH PROPOFOL;  Surgeon: Manya Silvas, MD;  Location: [facility] ENDOSCOPY;  Service: Endoscopy;  Laterality: N/A;  .  ESOPHAGEAL DILATION    . ESOPHAGOGASTRODUODENOSCOPY (EGD) WITH PROPOFOL N/A 04/01/2018   Procedure: ESOPHAGOGASTRODUODENOSCOPY (EGD) WITH PROPOFOL;  Surgeon: Manya Silvas, MD;  Location[facility] END[facility]COPY;  Service: Endoscopy;  Laterality: N/A;  . LEFT HEART CATH AND CORS/GRAFTS ANGIOGRAPHY N/A 06/12/2019   Procedure: LEFT HEART CATH AND CORS/GRAFTS ANGIOGRAPHY;  Surgeon: Teodoro Spray, MD;  Location: [place] CV LAB;  Service: Cardiovascular;  Laterality: N/A;  . LEFT HEART CATH AND CORS/GRAFTS ANGIOGRAPHY N/A 03/11/2020   Procedure: LEFT HEART CATH AND CORS/GRAFTS ANGIOGRAPHY;  Surgeon: Isaias Cowman, MD;  Location: [place] CV LAB;  Service: Cardiovascular;  Laterality: N/A;  . TONSILLECTOMY    . VASCULAR SURGERY     Family History  Problem Relation Age of Onset  . Heart attack Mother   . Depression Mother   . Heart disease Mother   . COPD Mother   . Hypertension Mother   . Heart attack Father   . Diabetes Father   . Depression Father   . Heart disease Father   . Cirrhosis Father   . Parkinson's disease Brother    Social History   Socioeconomic History  . Marital status: Widowed    Spouse name: Not on file  . Number of children: 1  . Years of education: Not on file  . Highest education level: 10th grade  Occupational History  . Occupation: Disabled  . Occupation: on Fish farm manager  Tobacco Use  . Smoking status: Former Smoker    Packs/day: 3.00    Years: 50.00    Pack years: 150.00    Types: Cigarettes    Quit date: 04/22/2013    Years since quitting: 7.0  . Smokeless tobacco: Never Used  Vaping Use  . Vaping Use: Never used  Substance and Sexual Activity  . Alcohol use: No    Comment: remotely quit alcohol use. Hx of heavy alcohol use.  . Drug use: No  . Sexual activity: Not on file  Other Topics Concern  . Not on file  Social History Narrative   Pt lives in [place] with wife.  Does not routinely exercise.   Social Determinants of Health    Financial Resource Strain: Low Risk   . Difficulty of Paying Living Expenses: Not hard at all  Food Insecurity: No Food Insecurity  . Worried About Charity fundraiser in the Last Year: Never true  .  Ran Out of Food in the Last Year: Never true  Transportation Needs: No Transportation Needs  . Lack of Transportation (Medical): No  . Lack of Transportation (Non-Medical): No  Physical Activity: Inactive  . Days of Exercise per Week: 0 days  . Minutes of Exercise per Session: 0 min  Stress: No Stress Concern Present  . Feeling of Stress : Only a little  Social Connections: Moderately Isolated  . Frequency of Communication with Friends and Family: More than three times a week  . Frequency of Social Gatherings with Friends and Family: More than three times a week  . Attends Religious Services: More than 4 times per year  . Active Member of Clubs or Organizations: No  . Attends Archivist Meetings: Never  . Marital Status: Widowed    Tobacco Counseling Counseling given: Not Answered   Clinical Intake:  Pre-visit preparation completed: Yes  Pain : 0-10 (Due to disc disease) Pain Score: 8  Pain Type: Chronic pain Pain Location: Back Pain Orientation: Lower Pain Descriptors / Indicators: Aching Pain Frequency: Constant Pain Relieving Factors: Taking Oxycodone for pain.  Pain Relieving Factors: Taking Oxycodone for pain.  Nutritional Risks: Nausea/ vomitting/ diarrhea (Diarrhea due to diet.) Diabetes: Yes  How often do you need to have someone help you when you read instructions, pamphlets, or other written materials from your doctor or pharmacy?: 1 - Never  Diabetic? Yes  Nutrition Risk Assessment:  Has the patient had any N/V/D within the last 2 months?  No  Does the patient have any non-healing wounds?  No  Has the patient had any unintentional weight loss or weight gain?  No   Diabetes:  Is the patient diabetic?  Yes  If diabetic, was a CBG obtained  today?  No  Did the patient bring in their glucometer from home?  No  How often do you monitor your CBG's? Twice a day- currently needs a battery for glucometer.   Financial Strains and Diabetes Management:  Are you having any financial strains with the device, your supplies or your medication? No .  Does the patient want to be seen by Chronic Care Management for management of their diabetes?  No  Would the patient like to be referred to a Nutritionist or for Diabetic Management?  No   Diabetic Exams:  Diabetic Eye Exam: Overdue for diabetic eye exam. Pt has been advised about the importance in completing this exam. Patient advised to call and schedule an eye exam. Pt plans to set up an eye exam this year.  Diabetic Foot Exam: Overdue, Pt has been advised about the importance in completing this exam. Note made to follow up on this at next in office apt.   Interpreter Needed?: No  Information entered by :: [facility], LPN   Activities of Daily Living In your present state of health, do you have any difficulty performing the following activities: 05/09/2020 03/08/2020  Hearing? N N  Vision? N Y  Comment Wears eye glasses. -  Difficulty concentrating or making decisions? N N  Walking or climbing stairs? Y N  Comment Due to back pain and SOB. -  Dressing or bathing? N N  Doing errands, shopping? N N  Preparing Food and eating ? N -  Using the Toilet? N -  In the past six months, have you accidently leaked urine? Y -  Comment Only at night when sleeping. Pt to f/u with PCP about this at next in office apt. -  Do you  have problems with loss of bowel control? N -  Managing your Medications? N -  Managing your Finances? N -  Housekeeping or managing your Housekeeping? N -  Some recent data might be hidden    Patient Care Team: Birdie Sons, MD as PCP - General (Family Medicine) Allyne Gee, MD as Consulting Physician (Pulmonary Disease) Rodney Booze as Physician  Assistant Anell Barr, OD as Consulting Physician (Optometry)  Indicate any recent Medical Services you may have received from other than [facility] providers in the past year (date may be approximate).     Assessment:   This is a routine wellness examination for Leodan.  Hearing/Vision screen No exam data present  Dietary issues and exercise activities discussed: Current Exercise Habits: The patient does not participate in regular exercise at present, Exercise limited by: orthopedic condition(s)  Goals    . DIET - REDUCE PORTION SIZE     Recommend eating 3 small meals a day with two healthy snacks in between.     . Increase water intake     Starting 10/27/16, I will increase my water intake to 4-5 glasses a day.    . Prevent falls     Recommend to remove any items from the home that may cause slips or trips.      Depression Screen PHQ 2/9 Scores 05/09/2020 03/29/2020 03/21/2019 08/17/2018 05/17/2018 04/27/2018 04/18/2018  PHQ - 2 Score 0 1 2 0 0 0 0  PHQ- 9 Score - - 9 - - - -    Fall Risk Fall Risk  05/09/2020 01/02/2020 08/17/2018 05/17/2018 04/27/2018  Falls in the past year? 1 0 No Yes No  Comment Fallen over coffe table - - - -  Number falls in past yr: 1 0 - 1 -  Comment - - - - -  Injury with Fall? 0 0 - No -  Risk for fall due to : - - - History of fall(s) -  Follow up Falls prevention discussed - - - -    Any stairs in or around the home? No  If so, are there any without handrails? N/A Home free of loose throw rugs in walkways, pet beds, electrical cords, etc? Yes  Adequate lighting in your home to reduce risk of falls? Yes   ASSISTIVE DEVICES UTILIZED TO PREVENT FALLS:  Life alert? No  Use of a cane, walker or w/c? No  Grab bars in the bathroom? Yes  Shower chair or bench in shower? Yes  Elevated toilet seat or a handicapped toilet? No    Cognitive Function: Declined today.      6CIT Screen 10/27/2016  What Year? 0 points  What month? 0 points  What time? 0  points  Count back from 20 0 points  Months in reverse 4 points  Repeat phrase 4 points  Total Score 8    Immunizations Immunization History  Administered Date(s) Administered  . Influenza Inj Mdck Quad Pf 08/25/2019  . Influenza,inj,Quad PF,6+ Mos 08/02/2015, 07/17/2016, 08/13/2017, 07/27/2018  . Influenza-Unspecified 07/27/2018  . Pneumococcal Polysaccharide-23 05/06/2004, 08/22/2015  . Td 10/28/2009, 08/31/2017  . Tdap 05/13/2011  . Zoster Recombinat (Shingrix) 08/13/2017    TDAP status: Up to date Flu Vaccine status: Up to date Pneumococcal vaccine status: Declined,  Education has been provided regarding the importance of this vaccine but patient still declined. Advised may receive this vaccine at local pharmacy or Health Dept. Aware to provide a copy of the vaccination record if obtained  from local pharmacy or Health Dept. Verbalized acceptance and understanding.  Covid-19 vaccine status: Completed vaccines  Qualifies for Shingles Vaccine? Yes   Zostavax completed No   Shingrix Completed?: No, first dose received 08/13/17. Second dose due currently. Pt to follow up with pharmacy on this.   Screening Tests Health Maintenance  Topic Date Due  . COVID-19 Vaccine (1) Never done  . OPHTHALMOLOGY EXAM  08/25/2018  . FOOT EXAM  08/31/2018  . PNA vac Low Risk Adult (1 of 2 - PCV13) 01/06/2019  . INFLUENZA VACCINE  06/16/2020  . HEMOGLOBIN A1C  09/06/2020  . COLONOSCOPY  04/02/2023  . TETANUS/TDAP  09/01/2027  . Hepatitis C Screening  Completed    Health Maintenance  Health Maintenance Due  Topic Date Due  . COVID-19 Vaccine (1) Never done  . OPHTHALMOLOGY EXAM  08/25/2018  . FOOT EXAM  08/31/2018  . PNA vac Low Risk Adult (1 of 2 - PCV13) 01/06/2019    Colorectal cancer screening: Completed 04/01/18. Repeat every 5 years  Lung Cancer Screening: (Low Dose CT Chest recommended if Age 29-80 years, 30 pack-year currently smoking OR have quit w/in 15years.) does qualify.  Completed a scan on 02/2020. Repeat yearly.  Additional Screening:  Hepatitis C Screening: Up to date  Vision Screening: Recommended annual ophthalmology exams for early detection of glaucoma and other disorders of the eye. Is the patient up to date with their annual eye exam?  Yes  Who is the provider or what is the name of the office in which the patient attends annual eye exams? Dr Ellin Mayhew  If pt is not established with a provider, would they like to be referred to a provider to establish care? No .   Dental Screening: Recommended annual dental exams for proper oral hygiene  Community Resource Referral / Chronic Care Management: CRR required this visit?  No   CCM required this visit?  No      Plan:     I have personally reviewed and noted the following in the patient's chart:   . Medical and social history . Use of alcohol, tobacco or illicit drugs  . Current medications and supplements . Functional ability and status . Nutritional status . Physical activity . Advanced directives . List of other physicians . Hospitalizations, surgeries, and ER visits in previous 12 months . Vitals . Screenings to include cognitive, depression, and falls . Referrals and appointments  In addition, I have reviewed and discussed with patient certain preventive protocols, quality metrics, and best practice recommendations. A written personalized care plan for preventive services as well as general preventive health recommendations were provided to patient.     Toshio Slusher [place], [place]   [id number]   Nurse Notes: Pt needs a diabetic foot exam and Prevnar 13 vaccine at next in office apt. Pt plans to set up an eye exam this year. Advised pt to bring a copy of his COVID vaccine card.

## 2020-05-09 ENCOUNTER — Other Ambulatory Visit: Payer: Self-pay

## 2020-05-09 ENCOUNTER — Ambulatory Visit (INDEPENDENT_AMBULATORY_CARE_PROVIDER_SITE_OTHER): Payer: Medicare HMO

## 2020-05-09 DIAGNOSIS — Z0000 Encounter for general adult medical examination without abnormal findings: Secondary | ICD-10-CM

## 2020-05-09 NOTE — Patient Instructions (Signed)
Lawrence [name] , Thank you for taking time to come for your Medicare Wellness Visit. I appreciate your ongoing commitment to your health goals. Please review the following plan we discussed and let me know if I can assist you in the future.   Screening recommendations/referrals: Colonoscopy: Up to date, due 03/2023 Recommended yearly ophthalmology/optometry visit for glaucoma screening and checkup Recommended yearly dental visit for hygiene and checkup  Vaccinations: Influenza vaccine: Done 08/25/19 Pneumococcal vaccine: Prevnar 13 currently due Tdap vaccine: Up to date, due 08/2027 Shingles vaccine: First dose completed 08/13/17, second dose due.    Advanced directives: Advance directive discussed with you today. The office will provide you a copy for you to complete at home and have notarized. Once this is complete please bring a copy in to our office so we can scan it into your chart.  Conditions/risks identified: Fall risk prevention discussed today. Recommend to increase water intake to 6-8 8 oz glasses a day.   Next appointment: 05/10/20 @ 1:40 PM with Dr [name]. Declined scheduling an AWV for 2022 at this time.    Preventive Care 22 Years and Older, Male Preventive care refers to lifestyle choices and visits with your health care provider that can promote health and wellness. What does preventive care include?  A yearly physical exam. This is also called an annual well check.  Dental exams once or twice a year.  Routine eye exams. Ask your health care provider how often you should have your eyes checked.  Personal lifestyle choices, including:  Daily care of your teeth and gums.  Regular physical activity.  Eating a healthy diet.  Avoiding tobacco and drug use.  Limiting alcohol use.  Practicing safe sex.  Taking low doses of aspirin every day.  Taking vitamin and mineral supplements as recommended by your health care provider. What happens during an annual well  check? The services and screenings done by your health care provider during your annual well check will depend on your age, overall health, lifestyle risk factors, and family history of disease. Counseling  Your health care provider may ask you questions about your:  Alcohol use.  Tobacco use.  Drug use.  Emotional well-being.  Home and relationship well-being.  Sexual activity.  Eating habits.  History of falls.  Memory and ability to understand (cognition).  Work and work Statistician. Screening  You may have the following tests or measurements:  Height, weight, and BMI.  Blood pressure.  Lipid and cholesterol levels. These may be checked every 5 years, or more frequently if you are over 23 years old.  Skin check.  Lung cancer screening. You may have this screening every year starting at age 76 if you have a 30-pack-year history of smoking and currently smoke or have quit within the past 15 years.  Fecal occult blood test (FOBT) of the stool. You may have this test every year starting at age 24.  Flexible sigmoidoscopy or colonoscopy. You may have a sigmoidoscopy every 5 years or a colonoscopy every 10 years starting at age 83.  Prostate cancer screening. Recommendations will vary depending on your family history and other risks.  Hepatitis C blood test.  Hepatitis B blood test.  Sexually transmitted disease (STD) testing.  Diabetes screening. This is done by checking your blood sugar (glucose) after you have not eaten for a while (fasting). You may have this done every 1-3 years.  Abdominal aortic aneurysm (AAA) screening. You may need this if you are a current or former  smoker.  Osteoporosis. You may be screened starting at age 36 if you are at high risk. Talk with your health care provider about your test results, treatment options, and if necessary, the need for more tests. Vaccines  Your health care provider may recommend certain vaccines, such  as:  Influenza vaccine. This is recommended every year.  Tetanus, diphtheria, and acellular pertussis (Tdap, Td) vaccine. You may need a Td booster every 10 years.  Zoster vaccine. You may need this after age 52.  Pneumococcal 13-valent conjugate (PCV13) vaccine. One dose is recommended after age 53.  Pneumococcal polysaccharide (PPSV23) vaccine. One dose is recommended after age 73. Talk to your health care provider about which screenings and vaccines you need and how often you need them. This information is not intended to replace advice given to you by your health care provider. Make sure you discuss any questions you have with your health care provider. Document Released: 11/29/2015 Document Revised: 07/22/2016 Document Reviewed: 09/03/2015 Elsevier Interactive Patient Education  2017 [place] Prevention in the Home Falls can cause injuries. They can happen to people of all ages. There are many things you can do to make your home safe and to help prevent falls. What can I do on the outside of my home?  Regularly fix the edges of walkways and driveways and fix any cracks.  Remove anything that might make you trip as you walk through a door, such as a raised step or threshold.  Trim any bushes or trees on the path to your home.  Use bright outdoor lighting.  Clear any walking paths of anything that might make someone trip, such as rocks or tools.  Regularly check to see if handrails are loose or broken. Make sure that both sides of any steps have handrails.  Any raised decks and porches should have guardrails on the edges.  Have any leaves, snow, or ice cleared regularly.  Use sand or salt on walking paths during winter.  Clean up any spills in your garage right away. This includes oil or grease spills. What can I do in the bathroom?  Use night lights.  Install grab bars by the toilet and in the tub and shower. Do not use towel bars as grab bars.  Use  non-skid mats or decals in the tub or shower.  If you need to sit down in the shower, use a plastic, non-slip stool.  Keep the floor dry. Clean up any water that spills on the floor as soon as it happens.  Remove soap buildup in the tub or shower regularly.  Attach bath mats securely with double-sided non-slip rug tape.  Do not have throw rugs and other things on the floor that can make you trip. What can I do in the bedroom?  Use night lights.  Make sure that you have a light by your bed that is easy to reach.  Do not use any sheets or blankets that are too big for your bed. They should not hang down onto the floor.  Have a firm chair that has side arms. You can use this for support while you get dressed.  Do not have throw rugs and other things on the floor that can make you trip. What can I do in the kitchen?  Clean up any spills right away.  Avoid walking on wet floors.  Keep items that you use a lot in easy-to-reach places.  If you need to reach something above you, use a  strong step stool that has a grab bar.  Keep electrical cords out of the way.  Do not use floor polish or wax that makes floors slippery. If you must use wax, use non-skid floor wax.  Do not have throw rugs and other things on the floor that can make you trip. What can I do with my stairs?  Do not leave any items on the stairs.  Make sure that there are handrails on both sides of the stairs and use them. Fix handrails that are broken or loose. Make sure that handrails are as long as the stairways.  Check any carpeting to make sure that it is firmly attached to the stairs. Fix any carpet that is loose or worn.  Avoid having throw rugs at the top or bottom of the stairs. If you do have throw rugs, attach them to the floor with carpet tape.  Make sure that you have a light switch at the top of the stairs and the bottom of the stairs. If you do not have them, ask someone to add them for you. What  else can I do to help prevent falls?  Wear shoes that:  Do not have high heels.  Have rubber bottoms.  Are comfortable and fit you well.  Are closed at the toe. Do not wear sandals.  If you use a stepladder:  Make sure that it is fully opened. Do not climb a closed stepladder.  Make sure that both sides of the stepladder are locked into place.  Ask someone to hold it for you, if possible.  Clearly mark and make sure that you can see:  Any grab bars or handrails.  First and last steps.  Where the edge of each step is.  Use tools that help you move around (mobility aids) if they are needed. These include:  Canes.  Walkers.  Scooters.  Crutches.  Turn on the lights when you go into a dark area. Replace any light bulbs as soon as they burn out.  Set up your furniture so you have a clear path. Avoid moving your furniture around.  If any of your floors are uneven, fix them.  If there are any pets around you, be aware of where they are.  Review your medicines with your doctor. Some medicines can make you feel dizzy. This can increase your chance of falling. Ask your doctor what other things that you can do to help prevent falls. This information is not intended to replace advice given to you by your health care provider. Make sure you discuss any questions you have with your health care provider. Document Released: 08/29/2009 Document Revised: 04/09/2016 Document Reviewed: 12/07/2014 Elsevier Interactive Patient Education  2017 [employer].

## 2020-05-10 ENCOUNTER — Other Ambulatory Visit: Payer: Self-pay | Admitting: Family Medicine

## 2020-05-10 NOTE — Progress Notes (Signed)
Established patient visit   Patient: Lawrence [name]   DOB: 20-Aug-1956   64 y.o. Male  MRN: [id number] Visit Date: 05/13/2020  Today's healthcare provider: [name], MD   Chief Complaint  Patient presents with  . Diabetes  . Insomnia   I,Naoma Boxell,acting as a scribe for [name], MD.,have documented all relevant documentation on the behalf of [name], MD,as directed by  [name], MD while in the presence of [name], MD.  Subjective    HPI Diabetes Mellitus Type II, Follow-up  Lab Results  Component Value Date   HGBA1C 7.9 (H) 03/07/2020   HGBA1C 7.9 (A) 01/02/2020   HGBA1C 7.9 (A) 09/29/2019   Wt Readings from Last 3 Encounters:  05/13/20 226 lb 12.8 oz (102.9 kg)  03/29/20 226 lb 6.4 oz (102.7 kg)  03/11/20 227 lb 15.3 oz (103.4 kg)   Last seen for diabetes 4 months ago.  Management since then includes no changes. He reports good compliance with treatment. He is not having side effects.  Symptoms: No fatigue No foot ulcerations  No appetite changes No nausea  No paresthesia of the feet  No polydipsia  No polyuria No visual disturbances   No vomiting     Home blood sugar records: fasting range: 120's  Episodes of hypoglycemia? No    Current insulin regiment: None Most Recent Eye Exam: 08/25/2017 Current exercise: walking Current diet habits: well balanced  Pertinent Labs: Lab Results  Component Value Date   CHOL 170 03/08/2020   HDL 28 (L) 03/08/2020   LDLCALC UNABLE TO CALCULATE IF TRIGLYCERIDE OVER 400 mg/dL 03/08/2020   LDLDIRECT 84.1 03/08/2020   TRIG 514 (H) 03/08/2020   CHOLHDL 6.1 03/08/2020   Lab Results  Component Value Date   NA 136 03/11/2020   K 4.0 03/11/2020   CREATININE 0.74 03/11/2020   GFRNONAA >60 03/11/2020   GFRAA >60 03/11/2020   GLUCOSE 155 (H) 03/11/2020     --------------------------------------------------------------------------------------------------- Follow up for Insomnia:    The patient was last seen for this 1 months ago. Changes made at last visit include increased Trazodone to 150 mg and discussed trial of Belsomra if increased dose is not effective.   He reports good compliance with treatment. He feels that condition is Improved. He is not having side effects.   -----------------------------------------------------------------------------------------       Medications: Outpatient Medications Prior to Visit  Medication Sig  . Albuterol Sulfate, sensor, 108 (90 Base) MCG/ACT AEPB Inhale 2 puffs into the lungs at bedtime. AND PRN  . allopurinol (ZYLOPRIM) 300 MG tablet TAKE 1 TABLET BY MOUTH TWICE A DAY  . ALPRAZolam (XANAX) 1 MG tablet Take 1 tablet (1 mg total) by mouth 3 (three) times daily.  Marland Kitchen aspirin 81 MG chewable tablet Chew 81 mg by mouth daily.  Marland Kitchen atorvastatin (LIPITOR) 80 MG tablet TAKE ONE TABLET BY MOUTH AT BEDTIME  . BREO ELLIPTA 100-25 MCG/INH AEPB INHALE 1 PUFF INTO THE LUNGS DAILY  . budesonide (PULMICORT) 0.5 MG/2ML nebulizer solution Take 0.5 mg by nebulization 2 (two) times daily. As needed  . cetirizine (ZYRTEC) 10 MG tablet Take 1 tablet (10 mg total) by mouth at bedtime.  . dapagliflozin propanediol (FARXIGA) 10 MG TABS tablet Take 10 mg by mouth daily.  Marland Kitchen docusate sodium (COLACE) 100 MG capsule Take 100 mg by mouth daily. regular OTC stool softner  . ELIQUIS 5 MG TABS tablet TAKE 1 TABLET BY MOUTH TWICE A DAY  . isosorbide  mononitrate (IMDUR) 120 MG 24 hr tablet TAKE 1 TABLET BY MOUTH DAILY  . LINZESS 72 MCG capsule Take 72 mcg by mouth daily before breakfast.   . lisinopril (ZESTRIL) 10 MG tablet Take 10 mg by mouth daily.  . magnesium oxide (MAG-OX) 400 (241.3 Mg) MG tablet Take 1 tablet (400 mg total) by mouth every morning.  . methocarbamol (ROBAXIN) 500 MG tablet TAKE 1 TABLET BY MOUTH 4 TIMES DAILY  . nitroGLYCERIN (NITROSTAT) 0.4 MG SL tablet PLACE ONE TABLET UNDER THE TONGUE EVERY 5 MINUTES FOR CHEST PAIN. IF NO RELIEF  PAST 3RD TAB GO TO EMERGENCY ROOM  . omega-3 acid ethyl esters (LOVAZA) 1 g capsule Take 4 g by mouth in the morning, at noon, in the evening, and at bedtime.   Marland Kitchen omeprazole (PRILOSEC) 40 MG capsule Take 40 mg by mouth daily.   Marland Kitchen oxyCODONE-acetaminophen (PERCOCET) 10-325 MG tablet TAKE 1 TABLET BY MOUTH EVERY 4 HOURS  . ranolazine (RANEXA) 1000 MG SR tablet Take 1,000 mg by mouth 2 (two) times daily.  . sotalol (BETAPACE) 80 MG tablet Take 80 mg by mouth 2 (two) times daily.  Marland Kitchen tiotropium (SPIRIVA) 18 MCG inhalation capsule Place 18 mcg into inhaler and inhale daily.  . traZODone (DESYREL) 150 MG tablet Take 1 tablet (150 mg total) by mouth at bedtime.  Marland Kitchen venlafaxine XR (EFFEXOR-XR) 75 MG 24 hr capsule TAKE 1 CAPSULE BY MOUTH DAILY WITH BREAKFAST  . Cholecalciferol (VITAMIN D3) 125 MCG (5000 UT) CAPS Take 5,000 Units by mouth daily. (Patient not taking: Reported on 05/09/2020)  . torsemide (DEMADEX) 100 MG tablet Take 50 mg by mouth daily.  (Patient not taking: Reported on 05/13/2020)   No facility-administered medications prior to visit.    Review of Systems    Objective    BP (!) 168/94 (BP Location: Right Arm, Patient Position: Sitting, Cuff Size: Normal)   Pulse (!) 59   Temp (!) 97.3 F (36.3 C) (Temporal)   Ht 5' 7"  (1.702 m)   Wt 226 lb 12.8 oz (102.9 kg)   BMI 35.52 kg/m    Physical Exam   General: Appearance:    Obese male in no acute distress  Eyes:    PERRL, conjunctiva/corneas clear, EOM's intact       Lungs:     Clear to auscultation bilaterally, respirations unlabored  Heart:    Bradycardic. Regular rhythm. No murmurs, rubs, or gallops.   MS:   All extremities are intact.   Neurologic:   Awake, alert, oriented x 3. No apparent focal neurological           defect.   Ext:   1+ bipedal edema.     Results for orders placed or performed in visit on 05/13/20  POCT HgB A1C  Result Value Ref Range   Hemoglobin A1C 7.5 (A) 4.0 - 5.6 %   Est. average glucose Bld gHb  Est-mCnc 169     Assessment & Plan     1. Type 2 diabetes mellitus with diabetic nephropathy, without long-term current use of insulin (HCC) Stable, Continue current medications.    2. Primary insomnia Doing much better with addition of trazodone. Continue current medications.    3. Chronic diastolic CHF (congestive heart failure) ([place]) He needs refill - torsemide (DEMADEX) 100 MG tablet; Take 0.5 tablets (50 mg total) by mouth daily.  Dispense: 30 tablet; Refill: 3  4. Nocturia Likely secondary to fluid retention during day and redistribution at night, which may improve when  he gets back on torsemide.   5. Prostate cancer screening  - PSA Total (Reflex To Free)  6. Need for pneumococcal vaccination  - Pneumococcal conjugate vaccine 13-valent IM  7. Chronic obstructive pulmonary disease, unspecified COPD type (HCC)  - albuterol (VENTOLIN HFA) 108 (90 Base) MCG/ACT inhaler; INHALE 2 PUFFS BY MOUTH EVERY 6 HOURS AS NEEDED FOR SHORTNESS OF BREATH  Dispense: 18 g; Refill: 5    No follow-ups on file.         [name], MD  [facility] [phone] (phone) [phone] (fax)  [place]

## 2020-05-10 NOTE — Telephone Encounter (Signed)
Pharmacy call / Pt need a refill  nitroGLYCERIN (NITROSTAT) 0.4 MG SL tablet [[id number]] [facility] [address] [place], [place] - [place]  [place] [address]  Phone: [phone] Fax: [phone]

## 2020-05-10 NOTE — Telephone Encounter (Signed)
Requested Prescriptions  Pending Prescriptions Disp Refills  . nitroGLYCERIN (NITROSTAT) 0.4 MG SL tablet [Pharmacy Med Name: NITROGLYCERIN 0.4 MG SL TAB] 30 tablet 3    Sig: PLACE ONE TABLET UNDER THE TONGUE EVERY 5 MINUTES FOR CHEST PAIN. IF NO RELIEF PAST 3RD TAB GO TO EMERGENCY ROOM     Cardiovascular:  Nitrates Passed - 05/10/2020  2:47 PM      Passed - Last BP in normal range    BP Readings from Last 1 Encounters:  03/29/20 136/83         Passed - Last Heart Rate in normal range    Pulse Readings from Last 1 Encounters:  03/29/20 60         Passed - Valid encounter within last 12 months    Recent Outpatient Visits          1 month ago Insomnia, unspecified type   [facility] Birdie Sons, MD   4 months ago Type 2 diabetes mellitus with diabetic nephropathy, without long-term current use of insulin ([place])   [name] Birdie Sons, MD   7 months ago Type 2 diabetes mellitus with diabetic nephropathy, without long-term current use of insulin [facility])   [facility] Birdie Sons, MD   10 months ago Angina pectoris [facility])   [facility] Birdie Sons, MD   11 months ago Type 2 diabetes mellitus with diabetic nephropathy, without long-term current use of insulin [facility])   [facility] Birdie Sons, MD      Future Appointments            In 3 days [name], [name], MD [facility], [place]   In 2 months Ralene Bathe, MD [place]

## 2020-05-13 ENCOUNTER — Encounter: Payer: Self-pay | Admitting: Family Medicine

## 2020-05-13 ENCOUNTER — Other Ambulatory Visit: Payer: Self-pay

## 2020-05-13 ENCOUNTER — Ambulatory Visit (INDEPENDENT_AMBULATORY_CARE_PROVIDER_SITE_OTHER): Payer: Medicare HMO | Admitting: Family Medicine

## 2020-05-13 VITALS — BP 168/94 | HR 59 | Temp 97.3°F | Ht 67.0 in | Wt 226.8 lb

## 2020-05-13 DIAGNOSIS — F5101 Primary insomnia: Secondary | ICD-10-CM | POA: Insufficient documentation

## 2020-05-13 DIAGNOSIS — R351 Nocturia: Secondary | ICD-10-CM | POA: Diagnosis not present

## 2020-05-13 DIAGNOSIS — E1121 Type 2 diabetes mellitus with diabetic nephropathy: Secondary | ICD-10-CM

## 2020-05-13 DIAGNOSIS — Z125 Encounter for screening for malignant neoplasm of prostate: Secondary | ICD-10-CM | POA: Diagnosis not present

## 2020-05-13 DIAGNOSIS — J449 Chronic obstructive pulmonary disease, unspecified: Secondary | ICD-10-CM

## 2020-05-13 DIAGNOSIS — Z23 Encounter for immunization: Secondary | ICD-10-CM | POA: Diagnosis not present

## 2020-05-13 DIAGNOSIS — I5032 Chronic diastolic (congestive) heart failure: Secondary | ICD-10-CM

## 2020-05-13 LAB — POCT GLYCOSYLATED HEMOGLOBIN (HGB A1C)
Est. average glucose Bld gHb Est-mCnc: 169
Hemoglobin A1C: 7.5 % — AB (ref 4.0–5.6)

## 2020-05-13 MED ORDER — TORSEMIDE 100 MG PO TABS: 50.0000 mg | ORAL_TABLET | Freq: Every day | ORAL | 3 refills | Status: DC

## 2020-05-13 MED ORDER — ALBUTEROL SULFATE HFA 108 (90 BASE) MCG/ACT IN AERS: INHALATION_SPRAY | RESPIRATORY_TRACT | 5 refills | Status: DC

## 2020-05-14 ENCOUNTER — Other Ambulatory Visit: Payer: Self-pay | Admitting: Family Medicine

## 2020-05-14 ENCOUNTER — Telehealth: Payer: Self-pay

## 2020-05-14 DIAGNOSIS — I5032 Chronic diastolic (congestive) heart failure: Secondary | ICD-10-CM

## 2020-05-14 LAB — PSA TOTAL (REFLEX TO FREE): Prostate Specific Ag, Serum: 1.4 ng/mL (ref 0.0–4.0)

## 2020-05-14 NOTE — Telephone Encounter (Signed)
Patient called and was read note by Dr [name] written 05/14/20. Patient verbalized understanding and will come in for additional lab work next Wednesday or Thursday.

## 2020-05-14 NOTE — Telephone Encounter (Signed)
-----   Message from Birdie Sons, MD sent at 05/14/2020 10:32 AM EDT ----- PSA is normal.  Also, since he has been off of torsemide for awhile and this was restarted yesterday, he will need to check renal panel In 7-10 days (next Wednesday or Thursday) to make sure kidney functions and electrolytes are stable on the medication. Future order has been placed.

## 2020-05-14 NOTE — Telephone Encounter (Signed)
Attempted to contact patient, no answer left  voicemail. Okay for [facility] triage to advise patient. Patient does not need a OV just stop by and have labs done.

## 2020-05-15 DIAGNOSIS — G8929 Other chronic pain: Secondary | ICD-10-CM | POA: Diagnosis not present

## 2020-05-15 DIAGNOSIS — K5909 Other constipation: Secondary | ICD-10-CM | POA: Diagnosis not present

## 2020-05-15 DIAGNOSIS — R1013 Epigastric pain: Secondary | ICD-10-CM | POA: Diagnosis not present

## 2020-05-15 DIAGNOSIS — K219 Gastro-esophageal reflux disease without esophagitis: Secondary | ICD-10-CM | POA: Diagnosis not present

## 2020-05-15 DIAGNOSIS — R1310 Dysphagia, unspecified: Secondary | ICD-10-CM | POA: Diagnosis not present

## 2020-05-16 ENCOUNTER — Other Ambulatory Visit: Payer: Self-pay | Admitting: Student

## 2020-05-16 DIAGNOSIS — R1319 Other dysphagia: Secondary | ICD-10-CM

## 2020-05-22 ENCOUNTER — Other Ambulatory Visit: Payer: Self-pay | Admitting: Family Medicine

## 2020-05-22 DIAGNOSIS — M5410 Radiculopathy, site unspecified: Secondary | ICD-10-CM

## 2020-05-22 MED ORDER — OXYCODONE-ACETAMINOPHEN 10-325 MG PO TABS: ORAL_TABLET | ORAL | 0 refills | Status: DC

## 2020-05-22 NOTE — Telephone Encounter (Signed)
Requested medication (s) are due for refill today: yes  Requested medication (s) are on the active medication list: yes  Last refill:  04/24/20 #150  Future visit scheduled: yes  Notes to clinic:  Please review for refill. Refill not delegated per protocol    Requested Prescriptions  Pending Prescriptions Disp Refills   oxyCODONE-acetaminophen (PERCOCET) 10-325 MG tablet [Pharmacy Med Name: OXYCODONE-APAP 10-325 MG TAB] 150 tablet     Sig: TAKE 1 TABLET BY MOUTH EVERY 4 HOURS      Not Delegated - Analgesics:  Opioid Agonist Combinations Failed - 05/22/2020  5:07 PM      Failed - This refill cannot be delegated      Passed - Urine Drug Screen completed in last 360 days.      Passed - Valid encounter within last 6 months    Recent Outpatient Visits           1 week ago Type 2 diabetes mellitus with diabetic nephropathy, without long-term current use of insulin ([place])   [facility] Birdie Sons, MD   1 month ago Insomnia, unspecified type   [facility] Birdie Sons, MD   4 months ago Type 2 diabetes mellitus with diabetic nephropathy, without long-term current use of insulin [facility])   [facility] Birdie Sons, MD   7 months ago Type 2 diabetes mellitus with diabetic nephropathy, without long-term current use of insulin [facility])   [facility] Birdie Sons, MD   11 months ago Angina pectoris [facility])   [facility] Birdie Sons, MD       Future Appointments             In 2 months Ralene Bathe, MD [place]   In 3 months [name], [name], MD [facility], [place]

## 2020-05-22 NOTE — Telephone Encounter (Signed)
Medication Refill - Medication: oxycodone   Has the patient contacted their pharmacy? Yes.   (Agent: If no, request that the patient contact the pharmacy for the refill.) (Agent: If yes, when and what did the pharmacy advise?)  Preferred Pharmacy (with phone number or street name):  [facility] [address] [place], [place] - [place]  [place] [address]  Phone: [phone] Fax: [phone]  Hours: Not open 24 hours     Agent: Please be advised that RX refills may take up to 3 business days. We ask that you follow-up with your pharmacy.

## 2020-05-22 NOTE — Telephone Encounter (Signed)
Requested medication (s) are due for refill today:  Yes  Requested medication (s) are on the active medication list:  yes  Future visit scheduled:  yes  Last Refill: 04/24/20; #150; no RF  Note to clinic: medication is not delegated.   Requested Prescriptions  Pending Prescriptions Disp Refills   oxyCODONE-acetaminophen (PERCOCET) 10-325 MG tablet 150 tablet 0      Not Delegated - Analgesics:  Opioid Agonist Combinations Failed - 05/22/2020 11:14 AM      Failed - This refill cannot be delegated      Passed - Urine Drug Screen completed in last 360 days.      Passed - Valid encounter within last 6 months    Recent Outpatient Visits           1 week ago Type 2 diabetes mellitus with diabetic nephropathy, without long-term current use of insulin ([place])   [facility] Birdie Sons, MD   1 month ago Insomnia, unspecified type   [facility] Birdie Sons, MD   4 months ago Type 2 diabetes mellitus with diabetic nephropathy, without long-term current use of insulin [facility])   [facility] Birdie Sons, MD   7 months ago Type 2 diabetes mellitus with diabetic nephropathy, without long-term current use of insulin [facility])   [facility] Birdie Sons, MD   11 months ago Angina pectoris [facility])   [facility] Birdie Sons, MD       Future Appointments             In 2 months Ralene Bathe, MD [place]   In 3 months [name], [name], MD [facility], [place]

## 2020-05-23 ENCOUNTER — Other Ambulatory Visit: Payer: Self-pay

## 2020-05-23 ENCOUNTER — Telehealth: Payer: Self-pay | Admitting: Family Medicine

## 2020-05-23 DIAGNOSIS — I5032 Chronic diastolic (congestive) heart failure: Secondary | ICD-10-CM

## 2020-05-24 ENCOUNTER — Emergency Department
Admission: EM | Admit: 2020-05-24 | Discharge: 2020-05-24 | Disposition: A | Discharge status: Home / Self Care | Payer: Medicare HMO | Attending: Emergency Medicine | Admitting: Emergency Medicine

## 2020-05-24 ENCOUNTER — Other Ambulatory Visit: Payer: Self-pay

## 2020-05-24 ENCOUNTER — Emergency Department: Payer: Medicare HMO

## 2020-05-24 DIAGNOSIS — I130 Hypertensive heart and chronic kidney disease with heart failure and stage 1 through stage 4 chronic kidney disease, or unspecified chronic kidney disease: Secondary | ICD-10-CM | POA: Insufficient documentation

## 2020-05-24 DIAGNOSIS — W010XXA Fall on same level from slipping, tripping and stumbling without subsequent striking against object, initial encounter: Secondary | ICD-10-CM | POA: Diagnosis not present

## 2020-05-24 DIAGNOSIS — S51802A Unspecified open wound of left forearm, initial encounter: Secondary | ICD-10-CM

## 2020-05-24 DIAGNOSIS — J449 Chronic obstructive pulmonary disease, unspecified: Secondary | ICD-10-CM | POA: Diagnosis not present

## 2020-05-24 DIAGNOSIS — J45909 Unspecified asthma, uncomplicated: Secondary | ICD-10-CM | POA: Diagnosis not present

## 2020-05-24 DIAGNOSIS — Y9389 Activity, other specified: Secondary | ICD-10-CM | POA: Insufficient documentation

## 2020-05-24 DIAGNOSIS — I2510 Atherosclerotic heart disease of native coronary artery without angina pectoris: Secondary | ICD-10-CM | POA: Insufficient documentation

## 2020-05-24 DIAGNOSIS — Z7951 Long term (current) use of inhaled steroids: Secondary | ICD-10-CM | POA: Diagnosis not present

## 2020-05-24 DIAGNOSIS — Z85828 Personal history of other malignant neoplasm of skin: Secondary | ICD-10-CM | POA: Diagnosis not present

## 2020-05-24 DIAGNOSIS — E119 Type 2 diabetes mellitus without complications: Secondary | ICD-10-CM | POA: Diagnosis not present

## 2020-05-24 DIAGNOSIS — Z7901 Long term (current) use of anticoagulants: Secondary | ICD-10-CM | POA: Insufficient documentation

## 2020-05-24 DIAGNOSIS — S59912A Unspecified injury of left forearm, initial encounter: Secondary | ICD-10-CM | POA: Diagnosis not present

## 2020-05-24 DIAGNOSIS — Y9289 Other specified places as the place of occurrence of the external cause: Secondary | ICD-10-CM | POA: Insufficient documentation

## 2020-05-24 DIAGNOSIS — N182 Chronic kidney disease, stage 2 (mild): Secondary | ICD-10-CM | POA: Insufficient documentation

## 2020-05-24 DIAGNOSIS — Y999 Unspecified external cause status: Secondary | ICD-10-CM | POA: Diagnosis not present

## 2020-05-24 DIAGNOSIS — Z79899 Other long term (current) drug therapy: Secondary | ICD-10-CM | POA: Diagnosis not present

## 2020-05-24 DIAGNOSIS — I5033 Acute on chronic diastolic (congestive) heart failure: Secondary | ICD-10-CM | POA: Insufficient documentation

## 2020-05-24 LAB — RENAL FUNCTION PANEL
Albumin: 4 g/dL (ref 3.8–4.8)
BUN/Creatinine Ratio: 13 (ref 10–24)
BUN: 12 mg/dL (ref 8–27)
CO2: 25 mmol/L (ref 20–29)
Calcium: 9.7 mg/dL (ref 8.6–10.2)
Chloride: 98 mmol/L (ref 96–106)
Creatinine, Ser: 0.96 mg/dL (ref 0.76–1.27)
GFR calc Af Amer: 95 mL/min/{1.73_m2} (ref >59)
GFR calc non Af Amer: 82 mL/min/{1.73_m2} (ref >59)
Glucose: 222 mg/dL — ABNORMAL HIGH (ref 65–99)
Phosphorus: 3.1 mg/dL (ref 2.8–4.1)
Potassium: 4.1 mmol/L (ref 3.5–5.2)
Sodium: 139 mmol/L (ref 134–144)

## 2020-05-24 MED ORDER — CEPHALEXIN 500 MG PO CAPS
500.0000 mg | ORAL_CAPSULE | Freq: Three times a day (TID) | ORAL | 0 refills | Status: DC
Start: 2020-05-24 — End: 2020-05-31

## 2020-05-24 NOTE — Discharge Instructions (Addendum)
Follow-up with your regular doctor if not improving in 3 to 4 days.  Return emergency department worsening.  Take antibiotic as prescribed.  Clean the wound with soap and water only.  Try to apply a nonstick bandage so will not pull off the scab that is trying to lay down.

## 2020-05-24 NOTE — ED Provider Notes (Signed)
[facility] Emergency Department Provider Note  ____________________________________________   First MD Initiated Contact with Patient 05/24/20 1312     (approximate)  I have reviewed the triage vital signs and the nursing notes.   HISTORY  Chief Complaint Arm Pain    HPI Lawrence [name] is a 64 y.o. male presents emergency department complaining of a wound to left forearm.  Patient states approximately a week ago he kind of tripped and fell and hit 1 coffee table been the other causing a wound to the arm.  He continues to have pain in the left forearm.  States the wound is draining a lot.  No fever or chills.  No numbness or tingling.  No other injuries reported.    Past Medical History:  Diagnosis Date  . A-fib ([place])   . Anemia   . Anginal pain ([place])   . Anxiety   . Arthritis   . Asthma   . CAD (coronary artery disease)    a. 2002 CABGx2 (LIMA->LAD, VG->VG->OM1);  b. 09/2012 DES->OM;  c. 03/2015 PTCA of LAD [facility]) in setting of atretic LIMA; d. 05/2015 Cath [facility]): nonobs dzs; e. 06/2015 Cath ([facility]): LM nl, LAD 45p/d ISR, 50d, D1/2 small, LCX 50p/d ISR, OM1 70ost, 30 ISR, VG->OM1 50ost, 61m LIMA->LAD 99p/d - atretic, RCA dom, nl; f.cath 10/16: 40-50%(FFR 0.90) pLAD, 75% (FFR 0.77) mLAD s/p PCI/DES, oRCA 40% (FFR0.95)  . Cancer ([place]    SKIN CANCER ON BACK  . Celiac disease   . Chronic diastolic CHF (congestive heart failure) (HGarnav[place]    a. 06/2009 Echo: EF 60-65%, Gr 1 DD, triv AI, mildly dil LA, nl RV.  .Marland KitchenCOPD (chronic obstructive pulmonary disease) (H[place]    a. Chronic bronchitis and emphysema.  . DDD (degenerative disc disease), lumbar   . Diverticulosis   . Dysrhythmia   . Essential hypertension   . GERD (gastroesophageal reflux disease)   . History of hiatal hernia   . History of kidney stones    H/O  . History of tobacco abuse    a. Quit 2014.  .Marland KitchenMyocardial infarction (HCalum[place] 2002   4 STENTS  . Pancreatitis   . PSVT (paroxysmal  supraventricular tachycardia) (HBement    a. 10/2012 Noted on Zio Patch.  . Sleep apnea    LOST CORD TO CPAP -ONLY 02 @ BEDTIME  . Tubular adenoma of colon   . Type II diabetes mellitus ([facility]     Patient Active Problem List   Diagnosis Date Noted  . Primary insomnia 05/13/2020  . Recurrent major depressive disorder, remission status unspecified (HS[place] 03/29/2020  . Elevated troponin 03/07/2020  . Bereavement 09/09/2019  . Chronic respiratory failure with hypoxia (HCentral Bridge 05/29/2019  . Morbid obesity (H[place] 05/29/2019  . Trigger thumb of right hand 11/28/2018  . Eye pain, left 08/18/2018  . Acute on chronic heart failure ([place] 04/18/2018  . History of adenomatous polyp of colon 04/05/2018  . Abdominal pain, chronic, epigastric 11/06/2017  . Bilateral flank pain 03/24/2017  . Dyspnea 04/03/2016  . Hypotension 04/03/2016  . CKD (chronic kidney disease) stage 2, GFR 60-89 ml/min 04/03/2016  . Anemia 04/03/2016  . Paroxysmal atrial fibrillation ([place] 12/23/2015  . OSA (obstructive sleep apnea) 12/10/2015  . Left inguinal hernia 11/07/2015  . Anxiety 11/07/2015  . Unstable angina (HPort [place] 10/05/2015  . Back pain with left-sided radiculopathy 09/30/2015  . Nocturnal hypoxia 09/06/2015  . BPH (benign prostatic hyperplasia) 08/01/2015  . Chronic diastolic CHF (congestive heart failure) (HRav[place]   .  Angina pectoris ([place])   . Chest pain 07/11/2015  . COPD (chronic obstructive pulmonary disease) ([place]) 07/03/2015  . CAD (coronary artery disease) 06/26/2015  . HTN (hypertension) 06/26/2015  . 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[place]) 06/26/2015  . Achalasia 07/24/2014  . GERD (gastroesophageal reflux disease) 06/07/2014  . Former tobacco use 04/11/2013  . HLD (hyperlipidemia) 04/09/2013    Past Surgical History:  Procedure Laterality Date  . BYPASS GRAFT    . CARDIAC CATHETERIZATION N/A 07/12/2015   rocedure: Left Heart Cath and Cors/Grafts Angiography;  Surgeon: Belva Crome, MD;   Location: [place] CV LAB;  Service: Cardiovascular;  Laterality: N/A;  . CARDIAC CATHETERIZATION Right 10/07/2015   Procedure: Left Heart Cath and Cors/Grafts Angiography;  Surgeon: Dionisio David, MD;  Location: [place] CV LAB;  Service: Cardiovascular;  Laterality: Right;  . CARDIAC CATHETERIZATION N/A 04/06/2016   Procedure: Left Heart Cath and Coronary Angiography;  Surgeon: Yolonda Kida, MD;  Location: [place] CV LAB;  Service: Cardiovascular;  Laterality: N/A;  . CARDIAC CATHETERIZATION  04/06/2016   Procedure: Bypass Graft Angiography;  Surgeon: Yolonda Kida, MD;  Location: [place] CV LAB;  Service: Cardiovascular;;  . CARDIAC CATHETERIZATION N/A 11/02/2016   Procedure: Left Heart Cath and Cors/Grafts Angiography and possible PCI;  Surgeon: Yolonda Kida, MD;  Location: [place] CV LAB;  Service: Cardiovascular;  Laterality: N/A;  . CARDIAC CATHETERIZATION N/A 11/02/2016   Procedure: Coronary Stent Intervention;  Surgeon: Yolonda Kida, MD;  Location: [place] CV LAB;  Service: Cardiovascular;  Laterality: N/A;  . CHOLECYSTECTOMY    . CIRCUMCISION N/A 06/09/2019   Procedure: CIRCUMCISION ADULT;  Surgeon: Billey Co, MD;  Location: [facility] ORS;  Service: Urology;  Laterality: N/A;  . COLONOSCOPY WITH PROPOFOL N/A 04/01/2018   Procedure: COLONOSCOPY WITH PROPOFOL;  Surgeon: Manya Silvas, MD;  Location: [facility] ENDOSCOPY;  Service: Endoscopy;  Laterality: N/A;  . ESOPHAGEAL DILATION    . ESOPHAGOGASTRODUODENOSCOPY (EGD) WITH PROPOFOL N/A 04/01/2018   Procedure: ESOPHAGOGASTRODUODENOSCOPY (EGD) WITH PROPOFOL;  Surgeon: Manya Silvas, MD;  Location: [facility] ENDOSCOPY;  Service: Endoscopy;  Laterality: N/A;  . LEFT HEART CATH AND CORS/GRAFTS ANGIOGRAPHY N/A 06/12/2019   Procedure: LEFT HEART CATH AND CORS/GRAFTS ANGIOGRAPHY;  Surgeon: Teodoro Spray, MD;  Location: [place] CV LAB;  Service: Cardiovascular;  Laterality: N/A;  . LEFT HEART  CATH AND CORS/GRAFTS ANGIOGRAPHY N/A 03/11/2020   Procedure: LEFT HEART CATH AND CORS/GRAFTS ANGIOGRAPHY;  Surgeon: Isaias Cowman, MD;  Location: [place] CV LAB;  Service: Cardiovascular;  Laterality: N/A;  . TONSILLECTOMY    . VASCULAR SURGERY      Prior to Admission medications   Medication Sig Start Date End Date Taking? Authorizing Provider  albuterol (VENTOLIN HFA) 108 (90 Base) MCG/ACT inhaler INHALE 2 PUFFS BY MOUTH EVERY 6 HOURS AS NEEDED FOR SHORTNESS OF BREATH 05/13/20   Birdie Sons, MD  Albuterol Sulfate, sensor, 108 (90 Base) MCG/ACT AEPB Inhale 2 puffs into the lungs at bedtime. AND PRN    [provider]  allopurinol (ZYLOPRIM) 300 MG tablet TAKE 1 TABLET BY MOUTH TWICE A DAY 11/14/19   Birdie Sons, MD  ALPRAZolam Duanne Moron) 1 MG tablet Take 1 tablet (1 mg total) by mouth 3 (three) times daily. 12/07/19   Birdie Sons, MD  aspirin 81 MG chewable tablet Chew 81 mg by mouth daily.    [provider]  atorvastatin (LIPITOR) 80 MG tablet TAKE ONE TABLET BY MOUTH  AT BEDTIME 10/13/19   Birdie Sons, MD  BREO ELLIPTA 100-25 MCG/INH AEPB INHALE 1 PUFF INTO THE LUNGS DAILY 02/03/20   Birdie Sons, MD  budesonide (PULMICORT) 0.5 MG/2ML nebulizer solution Take 0.5 mg by nebulization 2 (two) times daily. As needed    [provider]  cephALEXin (KEFLEX) 500 MG capsule Take 1 capsule (500 mg total) by mouth 3 (three) times daily. 05/24/20   Mariyam Remington, Linden Dolin, PA-C  cetirizine (ZYRTEC) 10 MG tablet Take 1 tablet (10 mg total) by mouth at bedtime. 05/01/20   Birdie Sons, MD  Cholecalciferol (VITAMIN D3) 125 MCG (5000 UT) CAPS Take 5,000 Units by mouth daily. Patient not taking: Reported on 05/09/2020    [provider]  dapagliflozin propanediol (FARXIGA) 10 MG TABS tablet Take 10 mg by mouth daily.    [provider]  docusate sodium (COLACE) 100 MG capsule Take 100 mg by mouth daily. regular OTC stool softner    [provider]  ELIQUIS 5 MG TABS tablet TAKE 1 TABLET BY MOUTH TWICE A DAY 05/01/20   Birdie Sons, MD  isosorbide mononitrate (IMDUR) 120 MG 24 hr tablet TAKE 1 TABLET BY MOUTH DAILY 03/18/20   Birdie Sons, MD  LINZESS 72 MCG capsule Take 72 mcg by mouth daily before breakfast.     [provider]  lisinopril (ZESTRIL) 10 MG tablet Take 10 mg by mouth daily.    [provider]  magnesium oxide (MAG-OX) 400 (241.3 Mg) MG tablet Take 1 tablet (400 mg total) by mouth every morning. 06/13/19   Alisa Graff, FNP  methocarbamol (ROBAXIN) 500 MG tablet TAKE 1 TABLET BY MOUTH 4 TIMES DAILY 04/04/20   Birdie Sons, MD  nitroGLYCERIN (NITROSTAT) 0.4 MG SL tablet PLACE ONE TABLET UNDER THE TONGUE EVERY 5 MINUTES FOR CHEST PAIN. IF NO RELIEF PAST 3RD TAB GO TO EMERGENCY ROOM 05/10/20   Birdie Sons, MD  omega-3 acid ethyl esters (LOVAZA) 1 g capsule Take 4 g by mouth in the morning, at noon, in the evening, and at bedtime.     [provider]  omeprazole (PRILOSEC) 40 MG capsule Take 40 mg by mouth daily.  03/19/20   [provider]  oxyCODONE-acetaminophen (PERCOCET) 10-325 MG tablet TAKE 1 TABLET BY MOUTH EVERY 4 HOURS 05/22/20   Birdie Sons, MD  oxyCODONE-acetaminophen (PERCOCET) 10-325 MG tablet TAKE 1 TABLET BY MOUTH EVERY 4 HOURS 05/23/20   Birdie Sons, MD  ranolazine (RANEXA) 1000 MG SR tablet Take 1,000 mg by mouth 2 (two) times daily.    [provider]  sotalol (BETAPACE) 80 MG tablet Take 80 mg by mouth 2 (two) times daily.    [provider]  tiotropium (SPIRIVA) 18 MCG inhalation capsule Place 18 mcg into inhaler and inhale daily.    [provider]  torsemide (DEMADEX) 100 MG tablet Take 0.5 tablets (50 mg total) by mouth daily. 05/13/20   Birdie Sons, MD  traZODone (DESYREL) 150 MG tablet Take 1 tablet (150 mg total) by mouth at bedtime. 03/29/20   Birdie Sons, MD  venlafaxine XR (EFFEXOR-XR) 75 MG 24 hr  capsule TAKE 1 CAPSULE BY MOUTH DAILY WITH BREAKFAST 04/04/20   Birdie Sons, MD    Allergies Demerol  [meperidine hcl], Demerol [meperidine], Jardiance [empagliflozin], Prednisone, Sulfa antibiotics, Albuterol sulfate [albuterol], and Morphine sulfate  Family History  Problem Relation Age of Onset  . Heart attack Mother   .  Depression Mother   . Heart disease Mother   . COPD Mother   . Hypertension Mother   . Heart attack Father   . Diabetes Father   . Depression Father   . Heart disease Father   . Cirrhosis Father   . Parkinson's disease Brother     Social History Social History   Tobacco Use  . Smoking status: Former Smoker    Packs/day: 3.00    Years: 50.00    Pack years: 150.00    Types: Cigarettes    Quit date: 04/22/2013    Years since quitting: 7.0  . Smokeless tobacco: Never Used  Vaping Use  . Vaping Use: Never used  Substance Use Topics  . Alcohol use: No    Comment: remotely quit alcohol use. Hx of heavy alcohol use.  . Drug use: No    Review of Systems  Constitutional: No fever/chills Eyes: No visual changes. ENT: No sore throat. Respiratory: Denies cough Genitourinary: Negative for dysuria. Musculoskeletal: Negative for back pain.  Positive left forearm injury Skin: Negative for rash. Psychiatric: no mood changes,     ____________________________________________   PHYSICAL EXAM:  VITAL SIGNS: ED Triage Vitals [05/24/20 1226]  Enc Vitals Group     BP (!) 145/84     Pulse Rate 63     Resp 18     Temp 98 F (36.7 C)     Temp Source Oral     SpO2 96 %     Weight (!) 500 lb 0.1 oz (226.8 kg)     Height 5' 7"  (1.702 m)     Head Circumference      Peak Flow      Pain Score 9     Pain Loc      Pain Edu?      Excl. in [place]?     Constitutional: Alert and oriented. Well appearing and in no acute distress. Eyes: Conjunctivae are normal.  Head: Atraumatic. Nose: No congestion/rhinnorhea. Mouth/Throat: Mucous membranes are moist.     Neck:  supple no lymphadenopathy noted Cardiovascular: Normal rate, regular rhythm.  Respiratory: Normal respiratory effort.  No retractions, lungs c t a  GU: deferred Musculoskeletal: FROM all extremities, warm and well perfused, left forearm is tender to palpation, abrasion noted on the left forearm, area is still weeping Neurologic:  Normal speech and language.  Skin:  Skin is warm, dry . No rash noted. Psychiatric: Mood and affect are normal. Speech and behavior are normal.  ____________________________________________   LABS (all labs ordered are listed, but only abnormal results are displayed)  Labs Reviewed - No data to display ____________________________________________   ____________________________________________  RADIOLOGY  X-ray of the left forearm  ____________________________________________   PROCEDURES  Procedure(s) performed: No  Procedures    ____________________________________________   INITIAL IMPRESSION / ASSESSMENT AND PLAN / ED COURSE  Pertinent labs & imaging results that were available during my care of the patient were reviewed by me and considered in my medical decision making (see chart for details).   Patient presents emergency department with left forearm injury and pain with a wound that is still draining from 1 week ago.  Physical exam is consistent with a large abrasion which is not healed.  X-ray was reassuring with no fracture, foreign body, or noted soft tissue swelling.  I did explain the findings to the patient.  Is placed on Keflex 500 3 times daily for 7 days.  He was given a nonstick dressing.  He is  to clean it with soap and water.  Return emergency department worsening.  States he understands.  Tdap is up-to-date so he will not need a booster today.  Is discharged stable condition.      As part of my medical decision making, I reviewed the following data within the [place] notes reviewed and  incorporated, Old chart reviewed, Radiograph reviewed , Notes from prior ED visits and [place] Controlled Substance Database  ____________________________________________   FINAL CLINICAL IMPRESSION(S) / ED DIAGNOSES  Final diagnoses:  Open wound of left forearm, initial encounter      NEW MEDICATIONS STARTED DURING THIS VISIT:  Discharge Medication List as of 05/24/2020  2:21 PM    START taking these medications   Details  cephALEXin (KEFLEX) 500 MG capsule Take 1 capsule (500 mg total) by mouth 3 (three) times daily., Starting Fri 05/24/2020, Normal         Note:  This document was prepared using Dragon voice recognition software and may include unintentional dictation errors.    Versie Starks, PA-C 05/24/20 1616    Lavonia Drafts, MD 05/27/20 1256

## 2020-05-24 NOTE — ED Triage Notes (Signed)
Pt states that he tripped over the leg of the coffee table last Friday causing him to fall into another table, pt has abrasions to the left forearm and states that it cont to bother him and he wants it to be checked

## 2020-05-24 NOTE — ED Notes (Signed)
AAOx3.  Skin warm and dry.  NAD 

## 2020-06-10 DIAGNOSIS — J418 Mixed simple and mucopurulent chronic bronchitis: Secondary | ICD-10-CM | POA: Diagnosis not present

## 2020-06-10 DIAGNOSIS — I214 Non-ST elevation (NSTEMI) myocardial infarction: Secondary | ICD-10-CM | POA: Diagnosis not present

## 2020-06-10 DIAGNOSIS — I25118 Atherosclerotic heart disease of native coronary artery with other forms of angina pectoris: Secondary | ICD-10-CM | POA: Diagnosis not present

## 2020-06-10 DIAGNOSIS — I480 Paroxysmal atrial fibrillation: Secondary | ICD-10-CM | POA: Diagnosis not present

## 2020-06-10 DIAGNOSIS — I10 Essential (primary) hypertension: Secondary | ICD-10-CM | POA: Diagnosis not present

## 2020-06-13 ENCOUNTER — Other Ambulatory Visit: Payer: Self-pay | Admitting: Family Medicine

## 2020-06-13 DIAGNOSIS — E1121 Type 2 diabetes mellitus with diabetic nephropathy: Secondary | ICD-10-CM

## 2020-06-13 MED ORDER — MAGNESIUM OXIDE 400 (241.3 MG) MG PO TABS: 400.0000 mg | ORAL_TABLET | ORAL | 5 refills | Status: DC

## 2020-06-13 NOTE — Telephone Encounter (Signed)
Requested medication (s) are due for refill today:   Unknown  Requested medication (s) are on the active medication list:   Yes  Future visit scheduled:   Yes   Last ordered: Prescribed by a historical provider 8 months ago.  Returned because last prescribed by a historical provider   Requested Prescriptions  Pending Prescriptions Disp Refills   FARXIGA 10 MG TABS tablet [Pharmacy Med Name: FARXIGA 10 MG TAB] 30 tablet     Sig: TAKE 1 TABLET BY MOUTH DAILY      Endocrinology:  Diabetes - SGLT2 Inhibitors Passed - 06/13/2020  1:46 PM      Passed - Cr in normal range and within 360 days    Creatinine  Date Value Ref Range Status  03/07/2015 0.84 mg/dL Final    Comment:    0.61-1.24 NOTE: New Reference Range  01/22/15    Creatinine, Ser  Date Value Ref Range Status  05/23/2020 0.96 0.76 - 1.27 mg/dL Final   Creatinine, POC  Date Value Ref Range Status  10/27/2016 n/a mg/dL Final          Passed - LDL in normal range and within 360 days    Ldl Cholesterol, Calc  Date Value Ref Range Status  05/23/2014 42 0 - 100 mg/dL Final   LDL Calculated  Date Value Ref Range Status  01/10/2018 56 0 - 99 mg/dL Final   LDL Cholesterol  Date Value Ref Range Status  03/08/2020 UNABLE TO CALCULATE IF TRIGLYCERIDE OVER 400 mg/dL 0 - 99 mg/dL Final    Comment:           Total Cholesterol/HDL:CHD Risk Coronary Heart Disease Risk Table                     Men   Women  1/2 Average Risk   3.4   3.3  Average Risk       5.0   4.4  2 X Average Risk   9.6   7.1  3 X Average Risk  23.4   11.0        Use the calculated Patient Ratio above and the CHD Risk Table to determine the patient's CHD Risk.        ATP III CLASSIFICATION (LDL):  <100     mg/dL   Optimal  100-129  mg/dL   Near or Above                    Optimal  130-159  mg/dL   Borderline  160-189  mg/dL   High  >190     mg/dL   Very High Performed at [facility], [address] [place], [place] [address]     Direct LDL  Date Value Ref Range Status  03/08/2020 84.1 0 - 99 mg/dL Final    Comment:    Performed at [facility] [address] [place], [place] [address]          Passed - HBA1C is between 0 and 7.9 and within 180 days    Hemoglobin A1C  Date Value Ref Range Status  05/13/2020 7.5 (A) 4.0 - 5.6 % Final  10/15/2018 6.8  Final   Hgb A1c MFr Bld  Date Value Ref Range Status  03/07/2020 7.9 (H) 4.8 - 5.6 % Final    Comment:    (NOTE) Pre diabetes:          5.7%-6.4% Diabetes:              >  6.4% Glycemic control for   <7.0% adults with diabetes    A1c  Date Value Ref Range Status  02/19/2016 6.5  Final          Passed - eGFR in normal range and within 360 days    EGFR (African American)  Date Value Ref Range Status  03/07/2015 >60  Final   GFR calc Af Amer  Date Value Ref Range Status  05/23/2020 95 >59 mL/min/1.73 Final    Comment:    **[facility] currently reports eGFR in compliance with the current**   recommendations of the [employer]. [facility] will   update reporting as new guidelines are published from the NKF-ASN   Task force.    EGFR (Non-African Amer.)  Date Value Ref Range Status  03/07/2015 >60  Final    Comment:    eGFR values <43m/min/1.73 m2 may be an indication of chronic kidney disease (CKD). Calculated eGFR is useful in patients with stable renal function. The eGFR calculation will not be reliable in acutely ill patients when serum creatinine is changing rapidly. It is not useful in patients on dialysis. The eGFR calculation may not be applicable to patients at the low and high extremes of body sizes, pregnant women, and vegetarians.    GFR calc non Af Amer  Date Value Ref Range Status  05/23/2020 82 >59 mL/min/1.73 Final          Passed - Valid encounter within last 6 months    Recent Outpatient Visits           1 month ago Type 2 diabetes mellitus with diabetic nephropathy, without long-term current use  of insulin ([place]   [facility]   2 months ago Insomnia, unspecified type   [facility]   5 months ago Type 2 diabetes mellitus with diabetic nephropathy, without long-term current use of insulin ([facility]   [facility]   8 months ago Type 2 diabetes mellitus with diabetic nephropathy, without long-term current use of insulin ([facility]   [facility]   11 months ago Angina pectoris ([facility]   [facility]       Future Appointments             In 1 month KRalene Bathe MD [place]  In 3 months [name], DKirstie Peri MD [facility]

## 2020-06-13 NOTE — Telephone Encounter (Signed)
Medication Refill - Medication: Mag-Ox  Has the patient contacted their pharmacy? Yes.   (Agent: If no, request that the patient contact the pharmacy for the refill.) (Agent: If yes, when and what did the pharmacy advise?)  Preferred Pharmacy (with phone number or street name): [place], [place] - [address] [facility] [address]  Agent: Please be advised that RX refills may take up to 3 business days. We ask that you follow-up with your pharmacy.

## 2020-06-13 NOTE — Telephone Encounter (Signed)
It doesn't look like we have actually ever sent this in.

## 2020-06-20 ENCOUNTER — Other Ambulatory Visit: Payer: Self-pay | Admitting: Family Medicine

## 2020-06-20 DIAGNOSIS — M5410 Radiculopathy, site unspecified: Secondary | ICD-10-CM

## 2020-06-20 NOTE — Telephone Encounter (Signed)
Requested medication (s) are due for refill today:  Provider to determine  Requested medication (s) are on the active medication list:   Yes  Future visit scheduled:   Yes in 2 mo.   Last ordered: 05/22/2020 #150, RF 0  Non delegated refill   Requested Prescriptions  Pending Prescriptions Disp Refills   oxyCODONE-acetaminophen (PERCOCET) 10-325 MG tablet 150 tablet 0    Sig: TAKE 1 TABLET BY MOUTH EVERY 4 HOURS      Not Delegated - Analgesics:  Opioid Agonist Combinations Failed - 06/20/2020 11:24 AM      Failed - This refill cannot be delegated      Passed - Urine Drug Screen completed in last 360 days.      Passed - Valid encounter within last 6 months    Recent Outpatient Visits           1 month ago Type 2 diabetes mellitus with diabetic nephropathy, without long-term current use of insulin ([place])   [facility] Birdie Sons, MD   2 months ago Insomnia, unspecified type   [facility] Birdie Sons, MD   5 months ago Type 2 diabetes mellitus with diabetic nephropathy, without long-term current use of insulin [facility])   [facility]ie Sons, MD   8 months ago Type 2 diabetes mellitus with diabetic nephropathy, without long-term current use of insulin [facility])   [facility] Birdie Sons, MD   12 months ago Angina pectoris [facility])   [facility] Birdie Sons, MD       Future Appointments             In 1 month Ralene Bathe, MD [place]   In 2 months [name], [name], MD [facility], [place]

## 2020-06-20 NOTE — Telephone Encounter (Signed)
Pt request refill  oxyCODONE-acetaminophen (PERCOCET) 10-325 MG tablet   [facility] [place], [place] Phone:  [phone]  Fax:  [phone]

## 2020-06-20 NOTE — Telephone Encounter (Signed)
Requested medication (s) are due for refill today: Yes  Requested medication (s) are on the active medication list: Yes  Last refill:  05/22/20  Future visit scheduled: Yes  Notes to clinic:  See request.    Requested Prescriptions  Pending Prescriptions Disp Refills   oxyCODONE-acetaminophen (PERCOCET) 10-325 MG tablet [Pharmacy Med Name: OXYCODONE-APAP 10-325 MG TAB] 150 tablet     Sig: TAKE 1 TABLET BY MOUTH EVERY 4 HOURS      Not Delegated - Analgesics:  Opioid Agonist Combinations Failed - 06/20/2020 11:58 AM      Failed - This refill cannot be delegated      Passed - Urine Drug Screen completed in last 360 days.      Passed - Valid encounter within last 6 months    Recent Outpatient Visits           1 month ago Type 2 diabetes mellitus with diabetic nephropathy, without long-term current use of insulin ([place])   [facility] Birdie Sons, MD   2 months ago Insomnia, unspecified type   [facility] Birdie Sons, MD   5 months ago Type 2 diabetes mellitus with diabetic nephropathy, without long-term current use of insulin [facility])   [facility] Birdie Sons, MD   8 months ago Type 2 diabetes mellitus with diabetic nephropathy, without long-term current use of insulin [facility])   [facility] Birdie Sons, MD   12 months ago Angina pectoris [facility])   [facility] Birdie Sons, MD       Future Appointments             In 1 month Ralene Bathe, MD [place]   In 2 months [name], [name], MD [facility], [place]

## 2020-07-19 ENCOUNTER — Other Ambulatory Visit: Payer: Self-pay | Admitting: Family Medicine

## 2020-07-19 DIAGNOSIS — F419 Anxiety disorder, unspecified: Secondary | ICD-10-CM

## 2020-07-19 DIAGNOSIS — M5410 Radiculopathy, site unspecified: Secondary | ICD-10-CM

## 2020-07-19 MED ORDER — OXYCODONE-ACETAMINOPHEN 10-325 MG PO TABS: 1.0000 | ORAL_TABLET | ORAL | 0 refills | Status: DC | PRN

## 2020-07-19 NOTE — Telephone Encounter (Signed)
Pt request refill   oxyCODONE-acetaminophen (PERCOCET) 10-325 MG tablet   [facility] [place], [place] Phone:  [phone]  Fax:  [phone]

## 2020-07-19 NOTE — Telephone Encounter (Signed)
Requested medication (s) are due for refill today: yes  Requested medication (s) are on the active medication list: yes  Last refill:  12/07/19 #90 with 4 refills  Future visit scheduled: yes  Notes to clinic:  Please review for refill. Refill not delegated per protocol.     Requested Prescriptions  Pending Prescriptions Disp Refills   ALPRAZolam (XANAX) 1 MG tablet [Pharmacy Med Name: ALPRAZOLAM 1 MG TAB] 90 tablet     Sig: TAKE ONE TABLET BY MOUTH THREE TIMES A DAY      Not Delegated - Psychiatry:  Anxiolytics/Hypnotics Failed - 07/19/2020  4:28 PM      Failed - This refill cannot be delegated      Passed - Urine Drug Screen completed in last 360 days.      Passed - Valid encounter within last 6 months    Recent Outpatient Visits           2 months ago Type 2 diabetes mellitus with diabetic nephropathy, without long-term current use of insulin ([place])   [facility] Birdie Sons, MD   3 months ago Insomnia, unspecified type   [facility] Birdie Sons, MD   6 months ago Type 2 diabetes mellitus with diabetic nephropathy, without long-term current use of insulin [place] [facility])   [facility] Birdie Sons, MD   9 months ago Type 2 diabetes mellitus with diabetic nephropathy, without long-term current use of insulin [facility])   [facility] Birdie Sons, MD   1 year ago Angina pectoris [facility])   [facility] Birdie Sons, MD       Future Appointments             In 5 days Ralene Bathe, MD [place]   In 1 month [name], [name], MD [facility], [place]

## 2020-07-19 NOTE — Telephone Encounter (Signed)
Requested medication (s) are due for refill today: yes   Requested medication (s) are on the active medication list: yes   Last refill:  06/21/2020  Future visit scheduled: yes   Notes to clinic:  this refill cannot be delegated    Requested Prescriptions  Pending Prescriptions Disp Refills   oxyCODONE-acetaminophen (PERCOCET) 10-325 MG tablet 150 tablet 0      Not Delegated - Analgesics:  Opioid Agonist Combinations Failed - 07/19/2020 11:26 AM      Failed - This refill cannot be delegated      Passed - Urine Drug Screen completed in last 360 days.      Passed - Valid encounter within last 6 months    Recent Outpatient Visits           2 months ago Type 2 diabetes mellitus with diabetic nephropathy, without long-term current use of insulin ([place])   [facility] Birdie Sons, MD   3 months ago Insomnia, unspecified type   [facility] Birdie Sons, MD   6 months ago Type 2 diabetes mellitus with diabetic nephropathy, without long-term current use of insulin [facility])   [facility] Birdie Sons, MD   9 months ago Type 2 diabetes mellitus with diabetic nephropathy, without long-term current use of insulin [facility])   [facility] Birdie Sons, MD   1 year ago Angina pectoris [facility])   [facility] Birdie Sons, MD       Future Appointments             In 5 days Ralene Bathe, MD [place]   In 1 month [name], [name], MD [facility], [place]

## 2020-07-24 ENCOUNTER — Ambulatory Visit: Payer: Medicare HMO | Admitting: Dermatology

## 2020-08-05 ENCOUNTER — Other Ambulatory Visit: Payer: Self-pay | Admitting: Family Medicine

## 2020-08-10 ENCOUNTER — Other Ambulatory Visit: Payer: Self-pay

## 2020-08-10 ENCOUNTER — Emergency Department: Payer: Medicare HMO

## 2020-08-10 ENCOUNTER — Encounter: Payer: Self-pay | Admitting: Emergency Medicine

## 2020-08-10 DIAGNOSIS — Z85828 Personal history of other malignant neoplasm of skin: Secondary | ICD-10-CM | POA: Diagnosis not present

## 2020-08-10 DIAGNOSIS — E1140 Type 2 diabetes mellitus with diabetic neuropathy, unspecified: Secondary | ICD-10-CM | POA: Insufficient documentation

## 2020-08-10 DIAGNOSIS — Z7982 Long term (current) use of aspirin: Secondary | ICD-10-CM | POA: Insufficient documentation

## 2020-08-10 DIAGNOSIS — Z79899 Other long term (current) drug therapy: Secondary | ICD-10-CM | POA: Insufficient documentation

## 2020-08-10 DIAGNOSIS — Z7901 Long term (current) use of anticoagulants: Secondary | ICD-10-CM | POA: Diagnosis not present

## 2020-08-10 DIAGNOSIS — I25110 Atherosclerotic heart disease of native coronary artery with unstable angina pectoris: Secondary | ICD-10-CM | POA: Insufficient documentation

## 2020-08-10 DIAGNOSIS — I130 Hypertensive heart and chronic kidney disease with heart failure and stage 1 through stage 4 chronic kidney disease, or unspecified chronic kidney disease: Secondary | ICD-10-CM | POA: Insufficient documentation

## 2020-08-10 DIAGNOSIS — J449 Chronic obstructive pulmonary disease, unspecified: Secondary | ICD-10-CM | POA: Diagnosis not present

## 2020-08-10 DIAGNOSIS — I5033 Acute on chronic diastolic (congestive) heart failure: Secondary | ICD-10-CM | POA: Insufficient documentation

## 2020-08-10 DIAGNOSIS — Z87891 Personal history of nicotine dependence: Secondary | ICD-10-CM | POA: Diagnosis not present

## 2020-08-10 DIAGNOSIS — N182 Chronic kidney disease, stage 2 (mild): Secondary | ICD-10-CM | POA: Insufficient documentation

## 2020-08-10 DIAGNOSIS — R079 Chest pain, unspecified: Secondary | ICD-10-CM | POA: Diagnosis not present

## 2020-08-10 DIAGNOSIS — I209 Angina pectoris, unspecified: Secondary | ICD-10-CM | POA: Diagnosis not present

## 2020-08-10 LAB — CBC
HCT: 50 % (ref 39.0–52.0)
Hemoglobin: 17.1 g/dL — ABNORMAL HIGH (ref 13.0–17.0)
MCH: 31.1 pg (ref 26.0–34.0)
MCHC: 34.2 g/dL (ref 30.0–36.0)
MCV: 91.1 fL (ref 80.0–100.0)
Platelets: 149 10*3/uL — ABNORMAL LOW (ref 150–400)
RBC: 5.49 MIL/uL (ref 4.22–5.81)
RDW: 13.7 % (ref 11.5–15.5)
WBC: 5.6 10*3/uL (ref 4.0–10.5)
nRBC: 0 % (ref 0.0–0.2)

## 2020-08-10 LAB — BASIC METABOLIC PANEL
Anion gap: 8 (ref 5–15)
BUN: 10 mg/dL (ref 8–23)
CO2: 30 mmol/L (ref 22–32)
Calcium: 8.7 mg/dL — ABNORMAL LOW (ref 8.9–10.3)
Chloride: 100 mmol/L (ref 98–111)
Creatinine, Ser: 1 mg/dL (ref 0.61–1.24)
GFR calc Af Amer: >60 mL/min (ref >60)
GFR calc non Af Amer: >60 mL/min (ref >60)
Glucose, Bld: 230 mg/dL — ABNORMAL HIGH (ref 70–99)
Potassium: 4.2 mmol/L (ref 3.5–5.1)
Sodium: 138 mmol/L (ref 135–145)

## 2020-08-10 LAB — TROPONIN I (HIGH SENSITIVITY): Troponin I (High Sensitivity): 16 ng/L (ref <18)

## 2020-08-10 NOTE — ED Triage Notes (Signed)
Pt arrives to triage ambulatory and c/o chest pain x 2 hours. Pt reports taking baby aspirin and nitro x 3 at home without relief.

## 2020-08-11 ENCOUNTER — Emergency Department
Admission: EM | Admit: 2020-08-11 | Discharge: 2020-08-11 | Disposition: A | Discharge status: Home / Self Care | Payer: Medicare HMO | Attending: Emergency Medicine | Admitting: Emergency Medicine

## 2020-08-11 ENCOUNTER — Encounter: Payer: Self-pay | Admitting: Radiology

## 2020-08-11 ENCOUNTER — Emergency Department: Payer: Medicare HMO

## 2020-08-11 DIAGNOSIS — I209 Angina pectoris, unspecified: Secondary | ICD-10-CM

## 2020-08-11 DIAGNOSIS — R079 Chest pain, unspecified: Secondary | ICD-10-CM | POA: Diagnosis not present

## 2020-08-11 DIAGNOSIS — I25110 Atherosclerotic heart disease of native coronary artery with unstable angina pectoris: Secondary | ICD-10-CM | POA: Diagnosis not present

## 2020-08-11 LAB — TROPONIN I (HIGH SENSITIVITY)
Troponin I (High Sensitivity): 16 ng/L (ref <18)
Troponin I (High Sensitivity): 15 ng/L (ref <18)

## 2020-08-11 MED ORDER — MORPHINE SULFATE (PF) 4 MG/ML IV SOLN
4.0000 mg | Freq: Once | INTRAVENOUS | Status: AC
  Administered 2020-08-11: 4 mg via INTRAVENOUS
  Filled 2020-08-11: qty 1

## 2020-08-11 MED ORDER — ISOSORBIDE MONONITRATE ER 60 MG PO TB24
60.0000 mg | ORAL_TABLET | Freq: Once | ORAL | Status: AC
  Administered 2020-08-11: 60 mg via ORAL
  Filled 2020-08-11: qty 1

## 2020-08-11 MED ORDER — IOHEXOL 350 MG/ML SOLN
100.0000 mL | Freq: Once | INTRAVENOUS | Status: AC | PRN
  Administered 2020-08-11: 100 mL via INTRAVENOUS

## 2020-08-11 MED ORDER — ONDANSETRON HCL 4 MG/2ML IJ SOLN
4.0000 mg | Freq: Once | INTRAMUSCULAR | Status: AC
  Administered 2020-08-11: 4 mg via INTRAVENOUS
  Filled 2020-08-11: qty 2

## 2020-08-11 NOTE — ED Notes (Signed)
Patient transported to CT 

## 2020-08-11 NOTE — ED Notes (Signed)
Pt up to void.

## 2020-08-11 NOTE — ED Notes (Signed)
Pt states is still having chest pain. md notified.

## 2020-08-11 NOTE — ED Provider Notes (Signed)
[facility] Emergency Department Provider Note  ____________________________________________  Time seen: Approximately 2:30 AM  I have reviewed the triage vital signs and the nursing notes.   HISTORY  Chief Complaint Chest Pain   HPI Lawrence [name] is a 64 y.o. male the history of CAD status post CABG and stents, A. fib on Eliquis, anemia, CHF with EF of 45 to 50%, hypertension, COPD, sleep apnea who presents for evaluation of chest pain.  Pain started 530.  Patient was sitting when the pain started and not exerting himself.  Pain has been constant since then.  Pain is 9 out of 10, sharp, located in the center of his chest and somewhat radiating to the upper back although patient does describe a history of chronic back pain.  No changes in his chronic cough.  Pain is not pleuritic in nature, no hemoptysis, no fever or chills, no leg pain or swelling, no prior history of PE or DVT.  Patient is vaccinated for Covid.  No known exposures.  Patient took full aspirin and 3 sublingual nitro with no improvement of his pain.   Past Medical History:  Diagnosis Date  . A-fib ([place])   . Anemia   . Anginal pain ([place])   . Anxiety   . Arthritis   . Asthma   . CAD (coronary artery disease)    a. 2002 CABGx2 (LIMA->LAD, VG->VG->OM1);  b. 09/2012 DES->OM;  c. 03/2015 PTCA of LAD [facility]) in setting of atretic LIMA; d. 05/2015 Cath [facility]): nonobs dzs; e. 06/2015 Cath ([facility]): LM nl, LAD 45p/d ISR, 50d, D1/2 small, LCX 50p/d ISR, OM1 70ost, 30 ISR, VG->OM1 50ost, 53m LIMA->LAD 99p/d - atretic, RCA dom, nl; f.cath 10/16: 40-50%(FFR 0.90) pLAD, 75% (FFR 0.77) mLAD s/p PCI/DES, oRCA 40% (FFR0.95)  . Cancer ([place]    SKIN CANCER ON BACK  . Celiac disease   . Chronic diastolic CHF (congestive heart failure) ([place]    a. 06/2009 Echo: EF 60-65%, Gr 1 DD, triv AI, mildly dil LA, nl RV.  .Marland KitchenCOPD (chronic obstructive pulmonary disease) ([place]    a. Chronic bronchitis and emphysema.  . DDD  (degenerative disc disease), lumbar   . Diverticulosis   . Dysrhythmia   . Essential hypertension   . GERD (gastroesophageal reflux disease)   . History of hiatal hernia   . History of kidney stones    H/O  . History of tobacco abuse    a. Quit 2014.  .Marland KitchenMyocardial infarction ([place]Max[place] 2002   4 STENTS  . Pancreatitis   . PSVT (paroxysmal supraventricular tachycardia) ([place]    a. 10/2012 Noted on Zio Patch.  . Sleep apnea    LOST CORD TO CPAP -ONLY 02 @ BEDTIME  . Tubular adenoma of colon   . Type II diabetes mellitus ([facility]     Patient Active Problem List   Diagnosis Date Noted  . Primary insomnia 05/13/2020  . Recurrent major depressive disorder, remission status unspecified ([place] 03/29/2020  . Elevated troponin 03/07/2020  . Bereavement 09/09/2019  . Chronic respiratory failure with hypoxia ([place] 05/29/2019  . Morbid obesity ([place] 05/29/2019  . Trigger thumb of right hand 11/28/2018  . Eye pain, left 08/18/2018  . Acute on chronic heart failure ([place] 04/18/2018  . History of adenomatous polyp of colon 04/05/2018  . Abdominal pain, chronic, epigastric 11/06/2017  . Bilateral flank pain 03/24/2017  . Dyspnea 04/03/2016  . Hypotension 04/03/2016  . CKD (chronic kidney disease) stage 2, GFR 60-89 ml/min 04/03/2016  .  Anemia 04/03/2016  . Paroxysmal atrial fibrillation ([place]) 12/23/2015  . OSA (obstructive sleep apnea) 12/10/2015  . Left inguinal hernia 11/07/2015  . Anxiety 11/07/2015  . Unstable angina ([place]) 10/05/2015  . Back pain with left-sided radiculopathy 09/30/2015  . Nocturnal hypoxia 09/06/2015  . BPH (benign prostatic hyperplasia) 08/01/2015  . Chronic diastolic CHF (congestive heart failure) ([place])   . Angina pectoris ([place])   . Chest pain 07/11/2015  . COPD (chronic obstructive pulmonary disease) ([place]) 07/03/2015  . CAD (coronary artery disease) 06/26/2015  . HTN (hypertension) 06/26/2015  . 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[place]) 06/26/2015  .  Achalasia 07/24/2014  . GERD (gastroesophageal reflux disease) 06/07/2014  . Former tobacco use 04/11/2013  . HLD (hyperlipidemia) 04/09/2013    Past Surgical History:  Procedure Laterality Date  . BYPASS GRAFT    . CARDIAC CATHETERIZATION N/A 07/12/2015   rocedure: Left Heart Cath and Cors/Grafts Angiography;  Surgeon: Belva Crome, MD;  Location: [place] CV LAB;  Service: Cardiovascular;  Laterality: N/A;  . CARDIAC CATHETERIZATION Right 10/07/2015   Procedure: Left Heart Cath and Cors/Grafts Angiography;  Surgeon: Dionisio David, MD;  Location: [place] CV LAB;  Service: Cardiovascular;  Laterality: Right;  . CARDIAC CATHETERIZATION N/A 04/06/2016   Procedure: Left Heart Cath and Coronary Angiography;  Surgeon: Yolonda Kida, MD;  Location: [place] CV LAB;  Service: Cardiovascular;  Laterality: N/A;  . CARDIAC CATHETERIZATION  04/06/2016   Procedure: Bypass Graft Angiography;  Surgeon: Yolonda Kida, MD;  Location: [place] CV LAB;  Service: Cardiovascular;;  . CARDIAC CATHETERIZATION N/A 11/02/2016   Procedure: Left Heart Cath and Cors/Grafts Angiography and possible PCI;  Surgeon: Yolonda Kida, MD;  Location: [place] CV LAB;  Service: Cardiovascular;  Laterality: N/A;  . CARDIAC CATHETERIZATION N/A 11/02/2016   Procedure: Coronary Stent Intervention;  Surgeon: Yolonda Kida, MD;  Location: [place] CV LAB;  Service: Cardiovascular;  Laterality: N/A;  . CHOLECYSTECTOMY    . CIRCUMCISION N/A 06/09/2019   Procedure: CIRCUMCISION ADULT;  Surgeon: Billey Co, MD;  Location: [facility] ORS;  Service: Urology;  Laterality: N/A;  . COLONOSCOPY WITH PROPOFOL N/A 04/01/2018   Procedure: COLONOSCOPY WITH PROPOFOL;  Surgeon: Manya Silvas, MD;  Location: [facility] ENDOSCOPY;  Service: Endoscopy;  Laterality: N/A;  . ESOPHAGEAL DILATION    . ESOPHAGOGASTRODUODENOSCOPY (EGD) WITH PROPOFOL N/A 04/01/2018   Procedure: ESOPHAGOGASTRODUODENOSCOPY (EGD) WITH  PROPOFOL;  Surgeon: Manya Silvas, MD;  Location[facility] END[facility]COPY;  Service: Endoscopy;  Laterality: N/A;  . LEFT HEART CATH AND CORS/GRAFTS ANGIOGRAPHY N/A 06/12/2019   Procedure: LEFT HEART CATH AND CORS/GRAFTS ANGIOGRAPHY;  Surgeon: Teodoro Spray, MD;  Location: [place] CV LAB;  Service: Cardiovascular;  Laterality: N/A;  . LEFT HEART CATH AND CORS/GRAFTS ANGIOGRAPHY N/A 03/11/2020   Procedure: LEFT HEART CATH AND CORS/GRAFTS ANGIOGRAPHY;  Surgeon: Isaias Cowman, MD;  Location: [place] CV LAB;  Service: Cardiovascular;  Laterality: N/A;  . TONSILLECTOMY    . VASCULAR SURGERY      Prior to Admission medications   Medication Sig Start Date End Date Taking? Authorizing Provider  albuterol (VENTOLIN HFA) 108 (90 Base) MCG/ACT inhaler INHALE 2 PUFFS BY MOUTH EVERY 6 HOURS AS NEEDED FOR SHORTNESS OF BREATH 05/13/20   Birdie Sons, MD  Albuterol Sulfate, sensor, 108 (90 Base) MCG/ACT AEPB Inhale 2 puffs into the lungs at bedtime. AND PRN    [provider]  allopurinol (ZYLOPRIM) 300 MG tablet TAKE 1 TABLET BY MOUTH  TWICE A DAY 11/14/19   Birdie Sons, MD  ALPRAZolam Duanne Moron) 1 MG tablet TAKE ONE TABLET BY MOUTH THREE TIMES A DAY 07/21/20   Birdie Sons, MD  aspirin 81 MG chewable tablet Chew 81 mg by mouth daily.    [provider]  atorvastatin (LIPITOR) 80 MG tablet TAKE ONE TABLET BY MOUTH AT BEDTIME 07/19/20   Birdie Sons, MD  BREO ELLIPTA 100-25 MCG/INH AEPB INHALE 1 PUFF INTO THE LUNGS DAILY 02/03/20   Birdie Sons, MD  budesonide (PULMICORT) 0.5 MG/2ML nebulizer solution Take 0.5 mg by nebulization 2 (two) times daily. As needed    [provider]  cephALEXin (KEFLEX) 500 MG capsule Take 1 capsule (500 mg total) by mouth 3 (three) times daily. 05/24/20   [name], Linden Dolin, PA-C  cetirizine (ZYRTEC) 10 MG tablet Take 1 tablet (10 mg total) by mouth at bedtime. 05/01/20   Birdie Sons, MD  Cholecalciferol (VITAMIN D3) 125 MCG (5000  UT) CAPS Take 5,000 Units by mouth daily. Patient not taking: Reported on 05/09/2020    [provider]  docusate sodium (COLACE) 100 MG capsule Take 100 mg by mouth daily. regular OTC stool softner    [provider]  ELIQUIS 5 MG TABS tablet TAKE 1 TABLET BY MOUTH TWICE A DAY 05/01/20   Birdie Sons, MD  FARXIGA 10 MG TABS tablet TAKE 1 TABLET BY MOUTH DAILY 06/13/20   Birdie Sons, MD  isosorbide mononitrate (IMDUR) 120 MG 24 hr tablet TAKE 1 TABLET BY MOUTH DAILY 06/13/20   Birdie Sons, MD  LINZESS 72 MCG capsule Take 72 mcg by mouth daily before breakfast.     [provider]  lisinopril (ZESTRIL) 10 MG tablet Take 10 mg by mouth daily.    [provider]  magnesium oxide (MAG-OX) 400 (241.3 Mg) MG tablet Take 1 tablet (400 mg total) by mouth every morning. 06/13/20   Birdie Sons, MD  methocarbamol (ROBAXIN) 500 MG tablet TAKE 1 TABLET BY MOUTH 4 TIMES DAILY 04/04/20   Birdie Sons, MD  nitroGLYCERIN (NITROSTAT) 0.4 MG SL tablet PLACE ONE TABLET UNDER THE TONGUE EVERY 5 MINUTES FOR CHEST PAIN. IF NO RELIEF PAST 3RD TAB GO TO EMERGENCY ROOM 05/10/20   Birdie Sons, MD  omega-3 acid ethyl esters (LOVAZA) 1 g capsule Take 4 g by mouth in the morning, at noon, in the evening, and at bedtime.     [provider]  omeprazole (PRILOSEC) 40 MG capsule Take 40 mg by mouth daily.  03/19/20   [provider]  oxyCODONE-acetaminophen (PERCOCET) 10-325 MG tablet TAKE 1 TABLET BY MOUTH EVERY 4 HOURS 06/21/20   Birdie Sons, MD  oxyCODONE-acetaminophen (PERCOCET) 10-325 MG tablet Take 1 tablet by mouth every 4 (four) hours as needed for pain. 07/19/20   Birdie Sons, MD  ranolazine (RANEXA) 1000 MG SR tablet Take 1,000 mg by mouth 2 (two) times daily.    [provider]  sotalol (BETAPACE) 80 MG tablet Take 80 mg by mouth 2 (two) times daily.    [provider]  tiotropium (SPIRIVA) 18 MCG inhalation capsule Place 18  mcg into inhaler and inhale daily.    [provider]  torsemide (DEMADEX) 100 MG tablet Take 0.5 tablets (50 mg total) by mouth daily. 05/13/20   Birdie Sons, MD  traZODone (DESYREL) 150 MG tablet Take 1 tablet (150 mg total) by mouth at bedtime. 03/29/20  Birdie Sons, MD  venlafaxine XR (EFFEXOR-XR) 75 MG 24 hr capsule TAKE 1 CAPSULE BY MOUTH DAILY WITH BREAKFAST 04/04/20   Birdie Sons, MD    Allergies Demerol  [meperidine hcl], Demerol [meperidine], Jardiance [empagliflozin], Prednisone, Sulfa antibiotics, Albuterol sulfate [albuterol], and Morphine sulfate  Family History  Problem Relation Age of Onset  . Heart attack Mother   . Depression Mother   . Heart disease Mother   . COPD Mother   . Hypertension Mother   . Heart attack Father   . Diabetes Father   . Depression Father   . Heart disease Father   . Cirrhosis Father   . Parkinson's disease Brother     Social History Social History   Tobacco Use  . Smoking status: Former Smoker    Packs/day: 3.00    Years: 50.00    Pack years: 150.00    Types: Cigarettes    Quit date: 04/22/2013    Years since quitting: 7.3  . Smokeless tobacco: Never Used  Vaping Use  . Vaping Use: Never used  Substance Use Topics  . Alcohol use: No    Comment: remotely quit alcohol use. Hx of heavy alcohol use.  . Drug use: No    Review of Systems  Constitutional: Negative for fever. Eyes: Negative for visual changes. ENT: Negative for sore throat. Neck: No neck pain  Cardiovascular: + chest pain. Respiratory: Negative for shortness of breath. Gastrointestinal: Negative for abdominal pain, vomiting or diarrhea. Genitourinary: Negative for dysuria. Musculoskeletal: Negative for back pain. Skin: Negative for rash. Neurological: Negative for headaches, weakness or numbness. Psych: No SI or HI  ____________________________________________   PHYSICAL EXAM:  VITAL SIGNS: ED Triage Vitals  Enc Vitals Group      BP 08/10/20 1937 (!) 192/90     Pulse Rate 08/10/20 1937 (!) 50     Resp 08/10/20 1937 19     Temp 08/10/20 1937 97.9 F (36.6 C)     Temp Source 08/10/20 1937 Oral     SpO2 08/10/20 1937 97 %     Weight 08/10/20 1937 220 lb (99.8 kg)     Height 08/10/20 1937 5' 7"  (1.702 m)     Head Circumference --      Peak Flow --      Pain Score 08/10/20 1942 9     Pain Loc --      Pain Edu? --      Excl. in [place]? --     Constitutional: Alert and oriented. Well appearing and in no apparent distress. HEENT:      Head: Normocephalic and atraumatic.         Eyes: Conjunctivae are normal. Sclera is non-icteric.       Mouth/Throat: Mucous membranes are moist.       Neck: Supple with no signs of meningismus. Cardiovascular: Regular rate and rhythm. No murmurs, gallops, or rubs. 2+ symmetrical distal pulses are present in all extremities. No JVD. Respiratory: Normal respiratory effort.  Decreased air movement bilaterally with no crackles or wheezes gastrointestinal: Soft, non tender, and non distended with positive bowel sounds. No rebound or guarding. Musculoskeletal: No edema, cyanosis, or erythema of extremities. Neurologic: Normal speech and language. Face is symmetric. Moving all extremities. No gross focal neurologic deficits are appreciated. Skin: Skin is warm, dry and intact. No rash noted. Psychiatric: Mood and affect are normal. Speech and behavior are normal.  ____________________________________________   LABS (all labs ordered are listed, but only abnormal results are  displayed)  Labs Reviewed  BASIC METABOLIC PANEL - Abnormal; Notable for the following components:      Result Value   Glucose, Bld 230 (*)    Calcium 8.7 (*)    All other components within normal limits  CBC - Abnormal; Notable for the following components:   Hemoglobin 17.1 (*)    Platelets 149 (*)    All other components within normal limits  TROPONIN I (HIGH SENSITIVITY)  TROPONIN I (HIGH SENSITIVITY)    TROPONIN I (HIGH SENSITIVITY)   ____________________________________________  EKG  ED ECG REPORT I, Rudene Re, the attending physician, personally viewed and interpreted this ECG.  Sinus bradycardia with occasional PACs, rate of 59, normal intervals, normal axis, no ST elevations or depressions, anterior Q waves. ____________________________________________  RADIOLOGY  I have personally reviewed the images performed during this visit and I agree with the Radiologist's read.   Interpretation by Radiologist:  DG Chest 2 View  Result Date: 08/10/2020 CLINICAL DATA:  Chest pain for 2 hours. EXAM: CHEST - 2 VIEW COMPARISON:  03/07/2020 FINDINGS: Postoperative changes in the mediastinum. Heart size and pulmonary vascularity are normal. Lungs are clear. No pleural effusions. No pneumothorax. Mediastinal contours appear intact. Degenerative changes in the spine. IMPRESSION: No active cardiopulmonary disease. Electronically Signed   By: Lucienne Capers M.D.   On: 08/10/2020 20:03   CT ANGIO CHEST AORTA W/CM & OR WO/CM  Result Date: 08/11/2020 CLINICAL DATA:  Chest pain since 17 30. Suspected aortic dissection. EXAM: CT ANGIOGRAPHY CHEST WITH CONTRAST TECHNIQUE: Multidetector CT imaging of the chest was performed using the standard protocol during bolus administration of intravenous contrast. Multiplanar CT image reconstructions and MIPs were obtained to evaluate the vascular anatomy. CONTRAST:  178m OMNIPAQUE IOHEXOL 350 MG/ML SOLN COMPARISON:  09/04/2019 FINDINGS: Cardiovascular: Unenhanced images demonstrate no evidence of intramural hematoma. Calcification in the thoracic aorta. Postoperative changes in the mediastinum. Coronary artery bypass and stents. Images obtained after contrast administration in the arterial phase demonstrate normal caliber patent thoracic aorta. No aneurysm or dissection. Great vessel origins are patent. Tricuspid aortic valve. Normal heart size. No pericardial  effusions. Pulmonary arteries are well opacified without evidence of pulmonary embolus. Mediastinum/Nodes: Esophagus is decompressed. No significant lymphadenopathy. Lungs/Pleura: Lungs are clear. No pleural effusions. No pneumothorax. Airways are patent. Upper Abdomen: Surgical absence of the gallbladder. No acute process demonstrated in the visualized upper abdomen. Musculoskeletal: Degenerative changes in the spine. No destructive bone lesions. Median sternotomy wires. Review of the MIP images confirms the above findings. IMPRESSION: 1. No evidence of aortic aneurysm or dissection. 2. No evidence of pulmonary embolus. 3. No evidence of active pulmonary disease. 4. Aortic atherosclerosis. Aortic Atherosclerosis (ICD10-I70.0). Electronically Signed   By: WLucienne CapersM.D.   On: 08/11/2020 03:16    ____________________________________________   PROCEDURES  Procedure(s) performed:yes .1-3 Lead EKG Interpretation Performed by: VRudene Re MD Authorized by: VRudene Re MD     Interpretation: non-specific     ECG rate assessment: bradycardic     Rhythm: sinus bradycardia     Ectopy: PAC     Critical Care performed: yes  CRITICAL CARE Performed by: CRudene Re ?  Total critical care time: 30 min  Critical care time was exclusive of separately billable procedures and treating other patients.  Critical care was necessary to treat or prevent imminent or life-threatening deterioration.  Critical care was time spent personally by me on the following activities: development of treatment plan with patient and/or surrogate as well as nursing,  discussions with consultants, evaluation of patient's response to treatment, examination of patient, obtaining history from patient or surrogate, ordering and performing treatments and interventions, ordering and review of laboratory studies, ordering and review of radiographic studies, pulse oximetry and re-evaluation of patient's  condition.  ____________________________________________   INITIAL IMPRESSION / ASSESSMENT AND PLAN / ED COURSE   64 y.o. male the history of CAD status post CABG and stents, A. fib on Eliquis, anemia, CHF with EF of 45 to 50%, hypertension, COPD, sleep apnea who presents for evaluation of constant sharp central chest pain for 9 hours.  Patient is well-appearing in no distress, has elevated BP of 192/90, looks euvolemic, strong equal pulses in all 4 extremities with no neurological deficits.  Decreased air movement bilaterally with no wheezing or crackles.  EKG showing no acute ischemia.  Differential diagnosis including ACS versus dissection versus PE versus pneumonia versus pneumothorax versus GERD.  Chest x-ray visualized by me with no pulmonary edema, no pneumothorax, no pneumonia, confirmed by radiology.  2 high-sensitivity troponin is negative. Last one was 5 hrs ago.  Since patient has persistent pain we will get a third troponin.  We will send patient for CT angio to rule out dissection.  We will give him a dose of his Imdur for angina and elevated BP and a dose of IV morphine for pain.  Patient be closely monitored on telemetry.  Old medical records reviewed showing recent catheterization in April 2021 which showed a two-vessel coronary artery disease with patent stents and a 70% stenosis of OM 1  _________________________ 4:10 AM on 08/11/2020 -----------------------------------------  3rd HS troponin negative. CTA visualized by me with no evidence of dissection or PE, confirmed by radiology.  Recommended increasing Imdur to 60 mg twice daily and close follow-up with his cardiologist.  Discussed my standard return precautions.    _____________________________________________ Please note:  Patient was evaluated in Emergency Department today for the symptoms described in the history of present illness. Patient was evaluated in the context of the global COVID-19 pandemic, which  necessitated consideration that the patient might be at risk for infection with the SARS-CoV-2 virus that causes COVID-19. Institutional protocols and algorithms that pertain to the evaluation of patients at risk for COVID-19 are in a state of rapid change based on information released by regulatory bodies including the CDC and federal and state organizations. These policies and algorithms were followed during the patient's care in the ED.  Some ED evaluations and interventions may be delayed as a result of limited staffing during the pandemic.   [place] Controlled Substance Database was reviewed by me. ____________________________________________   FINAL CLINICAL IMPRESSION(S) / ED DIAGNOSES   Final diagnoses:  Anginal pain ([place])      NEW MEDICATIONS STARTED DURING THIS VISIT:  ED Discharge Orders    None       Note:  This document was prepared using Dragon voice recognition software and may include unintentional dictation errors.    Alfred Levins, [place], MD 08/11/20 [phone]

## 2020-08-11 NOTE — Discharge Instructions (Addendum)
Take 79m of Imdur twice a day. Continue all your other medications as prescribed.  Follow-up with Dr. FUbaldo Glassingin 2 days.  Return to the emergency room for new or worsening chest pain, shortness of breath, dizziness, or fever.

## 2020-08-11 NOTE — ED Notes (Signed)
Warm blanket provided. Pt informed can discharge around 0630. Pt does not have a driver home.

## 2020-08-12 NOTE — Telephone Encounter (Signed)
Please advise? Never filled by pcp.

## 2020-08-15 ENCOUNTER — Other Ambulatory Visit: Payer: Self-pay | Admitting: Family Medicine

## 2020-08-15 DIAGNOSIS — M5410 Radiculopathy, site unspecified: Secondary | ICD-10-CM

## 2020-08-15 NOTE — Telephone Encounter (Signed)
Requested medication (s) are due for refill today: Yes  Requested medication (s) are on the active medication list: Yes  Last refill:  07/19/20  Future visit scheduled: Yes  Notes to clinic:  See request.    Requested Prescriptions  Pending Prescriptions Disp Refills   oxyCODONE-acetaminophen (PERCOCET) 10-325 MG tablet [Pharmacy Med Name: OXYCODONE-APAP 10-325 MG TAB] 150 tablet     Sig: TAKE 1 TABLET BY MOUTH EVERY 4 HOURS AS NEEDED FOR PAIN      Not Delegated - Analgesics:  Opioid Agonist Combinations Failed - 08/15/2020  1:05 PM      Failed - This refill cannot be delegated      Passed - Urine Drug Screen completed in last 360 days.      Passed - Valid encounter within last 6 months    Recent Outpatient Visits           3 months ago Type 2 diabetes mellitus with diabetic nephropathy, without long-term current use of insulin ([name])   [name], MD   4 months ago Insomnia, unspecified type   [name], MD   7 months ago Type 2 diabetes mellitus with diabetic nephropathy, without long-term current use of insulin [name], MD   10 months ago Type 2 diabetes mellitus with diabetic nephropathy, without long-term current use of insulin [name], MD   1 year ago Angina pectoris [name], MD       Future Appointments             In 4 weeks [name], [name], MD [name], [name]

## 2020-08-19 ENCOUNTER — Other Ambulatory Visit: Payer: Self-pay | Admitting: Family Medicine

## 2020-08-19 DIAGNOSIS — I10 Essential (primary) hypertension: Secondary | ICD-10-CM

## 2020-08-19 DIAGNOSIS — F419 Anxiety disorder, unspecified: Secondary | ICD-10-CM

## 2020-08-20 ENCOUNTER — Other Ambulatory Visit: Payer: Self-pay | Admitting: Family Medicine

## 2020-08-20 DIAGNOSIS — G4700 Insomnia, unspecified: Secondary | ICD-10-CM

## 2020-08-20 NOTE — Telephone Encounter (Signed)
Requested medication (s) are due for refill today: yes  Requested medication (s) are on the active medication list: yes  Last refill:  04/04/20 #60 3 refills  Future visit scheduled: yes in 3 weeks   Notes to clinic:  not delegated per protocol     Requested Prescriptions  Pending Prescriptions Disp Refills   methocarbamol (ROBAXIN) 500 MG tablet [Pharmacy Med Name: METHOCARBAMOL 500 MG TAB] 60 tablet 3    Sig: TAKE 1 TABLET BY MOUTH 4 TIMES DAILY      Not Delegated - Analgesics:  Muscle Relaxants Failed - 08/20/2020  2:03 PM      Failed - This refill cannot be delegated      Passed - Valid encounter within last 6 months    Recent Outpatient Visits           3 months ago Type 2 diabetes mellitus with diabetic nephropathy, without long-term current use of insulin ([place])   [facility] Birdie Sons, MD   4 months ago Insomnia, unspecified type   [facility] Birdie Sons, MD   7 months ago Type 2 diabetes mellitus with diabetic nephropathy, without long-term current use of insulin ([place])   [facility] Birdie Sons, MD   10 months ago Type 2 diabetes mellitus with diabetic nephropathy, without long-term current use of insulin ([place])   [facility] Birdie Sons, MD   1 year ago Angina pectoris [facility])   [facility] Birdie Sons, MD       Future Appointments             In 3 weeks [name], [name], MD [facility], [name]             Signed Prescriptions Disp Refills   traZODone (DESYREL) 150 MG tablet 30 tablet 1    Sig: TAKE ONE TABLET BY MOUTH AT BEDTIME      Psychiatry: Antidepressants - Serotonin Modulator Passed - 08/20/2020  2:03 PM      Passed - Completed PHQ-2 or PHQ-9 in the last 360 days.      Passed - Valid encounter within last 6 months    Recent Outpatient Visits           3 months ago Type 2 diabetes mellitus with diabetic nephropathy, without long-term  current use of insulin ([place])   [facility] Birdie Sons, MD   4 months ago Insomnia, unspecified type   [facility] Birdie Sons, MD   7 months ago Type 2 diabetes mellitus with diabetic nephropathy, without long-term current use of insulin [facility])   [facility] Birdie Sons, MD   10 months ago Type 2 diabetes mellitus with diabetic nephropathy, without long-term current use of insulin [facility])   [facility] Birdie Sons, MD   1 year ago Angina pectoris [facility])   [facility] Birdie Sons, MD       Future Appointments             In 3 weeks [name], [name], MD [facility], [name]

## 2020-08-20 NOTE — Telephone Encounter (Signed)
Future visit in 3 weeks

## 2020-08-21 ENCOUNTER — Other Ambulatory Visit: Payer: Self-pay | Admitting: Family Medicine

## 2020-08-21 DIAGNOSIS — F1190 Opioid use, unspecified, uncomplicated: Secondary | ICD-10-CM

## 2020-08-21 MED ORDER — NALOXONE HCL 4 MG/0.1ML NA LIQD: NASAL | 2 refills | Status: DC

## 2020-09-13 ENCOUNTER — Ambulatory Visit: Payer: Self-pay | Admitting: Family Medicine

## 2020-09-19 ENCOUNTER — Other Ambulatory Visit: Payer: Self-pay | Admitting: Family Medicine

## 2020-09-19 DIAGNOSIS — F419 Anxiety disorder, unspecified: Secondary | ICD-10-CM

## 2020-09-19 NOTE — Telephone Encounter (Signed)
Requested medication (s) are due for refill today:Yes  Requested medication (s) are on the active medication list: Yes  Last refill:    Oxy: 08/15/20  #150  0 refills      Xanax  07/21/20  #90  1 refill  (Due 09/20/20)  Future visit scheduled Yes  09/24/20  Notes to clinic: not delegated  Requested Prescriptions  Pending Prescriptions Disp Refills   oxyCODONE-acetaminophen (PERCOCET) 10-325 MG tablet [Pharmacy Med Name: OMAYOKHTX-HFSF 10-325 MG TAB] 150 tablet     Sig: TAKE 1 TABLET BY MOUTH EVERY 4 HOURS AS NEEDED FOR PAIN      Not Delegated - Analgesics:  Opioid Agonist Combinations Failed - 09/19/2020  9:44 AM      Failed - This refill cannot be delegated      Passed - Urine Drug Screen completed in last 360 days      Passed - Valid encounter within last 6 months    Recent Outpatient Visits           4 months ago Type 2 diabetes mellitus with diabetic nephropathy, without long-term current use of insulin ([place])   [facility] Birdie Sons, MD   5 months ago Insomnia, unspecified type   [facility] Birdie Sons, MD   8 months ago Type 2 diabetes mellitus with diabetic nephropathy, without long-term current use of insulin ([place])   [facility] Birdie Sons, MD   11 months ago Type 2 diabetes mellitus with diabetic nephropathy, without long-term current use of insulin ([place])   [place], [name], MD   1 year ago Angina pectoris [facility])   [place], [name], MD       Future Appointments             In 5 days [name], [name], MD [facility], [name]              ALPRAZolam Duanne Moron) 1 MG tablet [Pharmacy Med Name: ALPRAZOLAM 1 MG TAB] 90 tablet     Sig: TAKE ONE TABLET BY MOUTH THREE TIMES A DAY      Not Delegated - Psychiatry:  Anxiolytics/Hypnotics Failed - 09/19/2020  9:44 AM      Failed - This refill cannot be delegated      Passed - Urine Drug Screen  completed in last 360 days      Passed - Valid encounter within last 6 months    Recent Outpatient Visits           4 months ago Type 2 diabetes mellitus with diabetic nephropathy, without long-term current use of insulin ([place])   [facility] Birdie Sons, MD   5 months ago Insomnia, unspecified type   [facility] Birdie Sons, MD   8 months ago Type 2 diabetes mellitus with diabetic nephropathy, without long-term current use of insulin [facility])   [facility] Birdie Sons, MD   11 months ago Type 2 diabetes mellitus with diabetic nephropathy, without long-term current use of insulin [facility])   [facility] Birdie Sons, MD   1 year ago Angina pectoris [facility])   [facility] Birdie Sons, MD       Future Appointments             In 5 days [name], [name], MD [facility], [place]

## 2020-09-23 NOTE — Progress Notes (Signed)
Established patient visit   Patient: Lawrence [name]   DOB: 1955-12-20   64 y.o. Male  MRN: [id number] Visit Date: 09/24/2020  Today's healthcare provider: [name], MD   Chief Complaint  Patient presents with  . Congestive Heart Failure  . COPD  . Depression  . Diabetes  . Hypertension  . Pain Management   Subjective    HPI  Diabetes Mellitus Type II, Follow-up  Lab Results  Component Value Date   HGBA1C 7.5 (A) 05/13/2020   HGBA1C 7.9 (H) 03/07/2020   HGBA1C 7.9 (A) 01/02/2020   Wt Readings from Last 3 Encounters:  09/24/20 217 lb (98.4 kg)  08/10/20 220 lb (99.8 kg)  05/24/20 (!) 500 lb 0.1 oz (226.8 kg)   Last seen for diabetes 4 months ago.  Management since then includes continue same medication. He reports good compliance with treatment. He is not having side effects.  Symptoms: No fatigue No foot ulcerations  No appetite changes No nausea  No paresthesia of the feet  No polydipsia  No polyuria No visual disturbances   No vomiting     Home blood sugar records: fasting range: 150-155  Episodes of hypoglycemia? No    Current insulin regiment: NONE Most Recent Eye Exam: not UTD Current exercise: none Current diet habits: well balanced  Pertinent Labs: Lab Results  Component Value Date   CHOL 170 03/08/2020   HDL 28 (L) 03/08/2020   LDLCALC UNABLE TO CALCULATE IF TRIGLYCERIDE OVER 400 mg/dL 03/08/2020   LDLDIRECT 84.1 03/08/2020   TRIG 514 (H) 03/08/2020   CHOLHDL 6.1 03/08/2020   Lab Results  Component Value Date   NA 138 08/10/2020   K 4.2 08/10/2020   CREATININE 1.00 08/10/2020   GFRNONAA >60 08/10/2020   GFRAA >60 08/10/2020   GLUCOSE 230 (H) 08/10/2020     ---------------------------------------------------------------------------------------------------  Follow up for chronic CHF:  The patient was last seen for this 4 months ago. Changes made at last visit include none; continue same medication.  He reports good  compliance with treatment. He feels that condition is Unchanged. He is not having side effects.   -----------------------------------------------------------------------------------------  Follow up for COPD:  The patient was last seen for this 4 months ago. Changes made at last visit include none; continue same medication regiment.  He reports good compliance with treatment. He feels that condition is Unchanged. He is not having side effects.   -----------------------------------------------------------------------------------------  Follow up for Chronic pain:  The patient was last seen for this more than 6 months ago.  Changes made at last visit include none; continue same medication.  He reports good compliance with treatment. He feels that condition is Unchanged. He is not having side effects.   -----------------------------------------------------------------------------------------  Anxiety/ Depression, Follow-up  He was last seen for anxiety 6 months ago. Changes made at last visit include none; continue same medication.   He reports good compliance with treatment. He reports good tolerance of treatment.  He is not having side effects.   He feels his anxiety is moderate and Unchanged since last visit.  Symptoms: Yes chest pain No difficulty concentrating  No dizziness No fatigue  No feelings of losing control No insomnia  No irritable No palpitations  No panic attacks No racing thoughts  No shortness of breath No sweating  No tremors/shakes    GAD-7 Results No flowsheet data found.  PHQ-9 Scores PHQ9 SCORE ONLY 09/24/2020 05/09/2020 03/29/2020  PHQ-9 Total Score 0 0 1    ---------------------------------------------------------------------------------------------------  He also request prescription for a different muscle relaxer, reporting that methocarbamol has not helped.        Medications: Outpatient Medications Prior to Visit  Medication Sig    . albuterol (VENTOLIN HFA) 108 (90 Base) MCG/ACT inhaler INHALE 2 PUFFS BY MOUTH EVERY 6 HOURS AS NEEDED FOR SHORTNESS OF BREATH  . Albuterol Sulfate, sensor, 108 (90 Base) MCG/ACT AEPB Inhale 2 puffs into the lungs at bedtime. AND PRN  . allopurinol (ZYLOPRIM) 300 MG tablet TAKE 1 TABLET BY MOUTH TWICE A DAY  . ALPRAZolam (XANAX) 1 MG tablet TAKE ONE TABLET BY MOUTH THREE TIMES A DAY  . aspirin 81 MG chewable tablet Chew 81 mg by mouth daily.  Marland Kitchen atorvastatin (LIPITOR) 80 MG tablet TAKE ONE TABLET BY MOUTH AT BEDTIME  . BREO ELLIPTA 100-25 MCG/INH AEPB INHALE 1 PUFF INTO THE LUNGS DAILY  . budesonide (PULMICORT) 0.5 MG/2ML nebulizer solution Take 0.5 mg by nebulization 2 (two) times daily. As needed  . cephALEXin (KEFLEX) 500 MG capsule Take 1 capsule (500 mg total) by mouth 3 (three) times daily.  . cetirizine (ZYRTEC) 10 MG tablet Take 1 tablet (10 mg total) by mouth at bedtime.  . Cholecalciferol (VITAMIN D3) 125 MCG (5000 UT) CAPS Take 5,000 Units by mouth daily.   Marland Kitchen docusate sodium (COLACE) 100 MG capsule Take 100 mg by mouth daily. regular OTC stool softner  . ELIQUIS 5 MG TABS tablet TAKE 1 TABLET BY MOUTH TWICE A DAY  . FARXIGA 10 MG TABS tablet TAKE 1 TABLET BY MOUTH DAILY  . isosorbide mononitrate (IMDUR) 120 MG 24 hr tablet TAKE 1 TABLET BY MOUTH DAILY  . LINZESS 72 MCG capsule Take 72 mcg by mouth daily before breakfast.   . lisinopril (ZESTRIL) 10 MG tablet TAKE 1 TABLET BY MOUTH DAILY  . magnesium oxide (MAG-OX) 400 (241.3 Mg) MG tablet Take 1 tablet (400 mg total) by mouth every morning.  . methocarbamol (ROBAXIN) 500 MG tablet TAKE 1 TABLET BY MOUTH 4 TIMES DAILY  . naloxone (NARCAN) nasal spray 4 mg/0.1 mL 1 spray into nostril x1 and may repeat every 2-3 minutes until patient is responsive or EMS arrives  . nitroGLYCERIN (NITROSTAT) 0.4 MG SL tablet PLACE ONE TABLET UNDER THE TONGUE EVERY 5 MINUTES FOR CHEST PAIN. IF NO RELIEF PAST 3RD TAB GO TO EMERGENCY ROOM  . omega-3 acid  ethyl esters (LOVAZA) 1 g capsule TAKE 4 CAPSULES BY MOUTH DAILY  . omeprazole (PRILOSEC) 40 MG capsule Take 40 mg by mouth daily.   Marland Kitchen oxyCODONE-acetaminophen (PERCOCET) 10-325 MG tablet TAKE 1 TABLET BY MOUTH EVERY 4 HOURS  . oxyCODONE-acetaminophen (PERCOCET) 10-325 MG tablet TAKE 1 TABLET BY MOUTH EVERY 4 HOURS AS NEEDED FOR PAIN  . oxyCODONE-acetaminophen (PERCOCET) 10-325 MG tablet TAKE 1 TABLET BY MOUTH EVERY 4 HOURS AS NEEDED FOR PAIN  . ranolazine (RANEXA) 1000 MG SR tablet TAKE 1 TABLET BY MOUTH TWICE A DAY  . sotalol (BETAPACE) 80 MG tablet Take 80 mg by mouth 2 (two) times daily.  Marland Kitchen tiotropium (SPIRIVA) 18 MCG inhalation capsule Place 18 mcg into inhaler and inhale daily.  Marland Kitchen torsemide (DEMADEX) 100 MG tablet Take 0.5 tablets (50 mg total) by mouth daily.  . traZODone (DESYREL) 150 MG tablet TAKE ONE TABLET BY MOUTH AT BEDTIME  . venlafaxine XR (EFFEXOR-XR) 75 MG 24 hr capsule TAKE 1 CAPSULE BY MOUTH DAILY WITH BREAKFAST   No facility-administered medications prior to visit.    Review of Systems  Constitutional: Negative  for appetite change, chills and fever.  Respiratory: Negative for chest tightness, shortness of breath and wheezing.   Cardiovascular: Positive for chest pain. Negative for palpitations.  Gastrointestinal: Negative for abdominal pain, nausea and vomiting.      Objective    BP (!) 184/102 (BP Location: Left Arm, Patient Position: Sitting, Cuff Size: Large)   Pulse (!) 55   Temp 98.3 F (36.8 C) (Oral)   Resp 16   Wt 217 lb (98.4 kg)   BMI 33.99 kg/m    Physical Exam   General: Appearance:    Obese male in no acute distress  Eyes:    PERRL, conjunctiva/corneas clear, EOM's intact       Lungs:     Clear to auscultation bilaterally, respirations unlabored  Heart:    Bradycardic. Regular rhythm. No murmurs, rubs, or gallops.   MS:   All extremities are intact.   Neurologic:   Awake, alert, oriented x 3. No apparent focal neurological           defect.          Error reading HgbA1c.   Assessment & Plan     1. Type 2 diabetes mellitus with diabetic nephropathy, without long-term current use of insulin (HCC)  - Hemoglobin A1c  2. Primary hypertension Uncontrolled, add- amLODipine (NORVASC) 2.5 MG tablet; Take 1 tablet (2.5 mg total) by mouth daily.  Dispense: 90 tablet; Refill: 3 - TSH  3. Coronary artery disease involving native coronary artery of native heart with other form of angina pectoris ([place]) Asymptomatic. Compliant with medication.  Continue aggressive risk factor modification.   - CBC - Comprehensive metabolic panel  4. Need for influenza vaccination  - Flu Vaccine QUAD High Dose IM ([place]ad)  5. Back pain with left-sided radiculopathy Change methocarbamol, which has not been effective, to- metaxalone (SKELAXIN) 800 MG tablet; Take 1 tablet (800 mg total) by mouth 3 (three) times daily as needed (pain).  Dispense: 30 tablet; Refill: 3         The entirety of the information documented in the History of Present Illness, Review of Systems and Physical Exam were personally obtained by me. Portions of this information were initially documented by the CMA and reviewed by me for thoroughness and accuracy.      [name], MD  [facility] [phone] (phone) [phone] (fax)  [place]

## 2020-09-24 ENCOUNTER — Other Ambulatory Visit: Payer: Self-pay

## 2020-09-24 ENCOUNTER — Encounter: Payer: Self-pay | Admitting: Family Medicine

## 2020-09-24 ENCOUNTER — Ambulatory Visit (INDEPENDENT_AMBULATORY_CARE_PROVIDER_SITE_OTHER): Payer: Medicare HMO | Admitting: Family Medicine

## 2020-09-24 ENCOUNTER — Telehealth: Payer: Self-pay

## 2020-09-24 ENCOUNTER — Ambulatory Visit: Payer: Self-pay | Admitting: Family Medicine

## 2020-09-24 VITALS — BP 170/100 | HR 55 | Temp 98.3°F | Resp 16 | Wt 217.0 lb

## 2020-09-24 DIAGNOSIS — I10 Essential (primary) hypertension: Secondary | ICD-10-CM

## 2020-09-24 DIAGNOSIS — E1121 Type 2 diabetes mellitus with diabetic nephropathy: Secondary | ICD-10-CM

## 2020-09-24 DIAGNOSIS — I25118 Atherosclerotic heart disease of native coronary artery with other forms of angina pectoris: Secondary | ICD-10-CM | POA: Diagnosis not present

## 2020-09-24 DIAGNOSIS — M5410 Radiculopathy, site unspecified: Secondary | ICD-10-CM

## 2020-09-24 DIAGNOSIS — Z23 Encounter for immunization: Secondary | ICD-10-CM | POA: Diagnosis not present

## 2020-09-24 MED ORDER — AMLODIPINE BESYLATE 2.5 MG PO TABS: 2.5000 mg | ORAL_TABLET | Freq: Every day | ORAL | 3 refills | Status: DC

## 2020-09-24 MED ORDER — METAXALONE 800 MG PO TABS: 800.0000 mg | ORAL_TABLET | Freq: Three times a day (TID) | ORAL | 3 refills | Status: DC | PRN

## 2020-09-24 NOTE — Telephone Encounter (Signed)
Copied from [place] [id number]. Topic: Quick Communication - Rx Refill/Question >> Sep 24, 2020  2:45 PM Erick Blinks wrote: Pt called stating that he is missing the muscle relaxer that was discussed during today's appt, does not know the name   [place], [place] - [address] [place] [address] Phone: [phone] Fax: [phone]  [phone]

## 2020-09-24 NOTE — Patient Instructions (Signed)
.   Please review the attached list of medications and notify my office if there are any errors.   . Start taking amlodipine once a day for your blood pressure, in addition to your other medications.

## 2020-09-25 ENCOUNTER — Telehealth: Payer: Self-pay

## 2020-09-25 DIAGNOSIS — D751 Secondary polycythemia: Secondary | ICD-10-CM

## 2020-09-25 DIAGNOSIS — E1121 Type 2 diabetes mellitus with diabetic nephropathy: Secondary | ICD-10-CM

## 2020-09-25 LAB — COMPREHENSIVE METABOLIC PANEL
ALT: 22 IU/L (ref 0–44)
AST: 24 IU/L (ref 0–40)
Albumin/Globulin Ratio: 1.7 (ref 1.2–2.2)
Albumin: 4.7 g/dL (ref 3.8–4.8)
Alkaline Phosphatase: 115 IU/L (ref 44–121)
BUN/Creatinine Ratio: 9 — ABNORMAL LOW (ref 10–24)
BUN: 8 mg/dL (ref 8–27)
Bilirubin Total: 0.5 mg/dL (ref 0.0–1.2)
CO2: 28 mmol/L (ref 20–29)
Calcium: 10.8 mg/dL — ABNORMAL HIGH (ref 8.6–10.2)
Chloride: 96 mmol/L (ref 96–106)
Creatinine, Ser: 0.94 mg/dL (ref 0.76–1.27)
GFR calc Af Amer: 97 mL/min/{1.73_m2} (ref >59)
GFR calc non Af Amer: 84 mL/min/{1.73_m2} (ref >59)
Globulin, Total: 2.7 g/dL (ref 1.5–4.5)
Glucose: 256 mg/dL — ABNORMAL HIGH (ref 65–99)
Potassium: 5.7 mmol/L — ABNORMAL HIGH (ref 3.5–5.2)
Sodium: 139 mmol/L (ref 134–144)
Total Protein: 7.4 g/dL (ref 6.0–8.5)

## 2020-09-25 LAB — TSH: TSH: 1.43 u[IU]/mL (ref 0.450–4.500)

## 2020-09-25 LAB — CBC
Hematocrit: 62.2 % — ABNORMAL HIGH (ref 37.5–51.0)
Hemoglobin: 20.5 g/dL (ref 13.0–17.7)
MCH: 30.6 pg (ref 26.6–33.0)
MCHC: 33 g/dL (ref 31.5–35.7)
MCV: 93 fL (ref 79–97)
Platelets: 164 10*3/uL (ref 150–450)
RBC: 6.69 x10E6/uL — ABNORMAL HIGH (ref 4.14–5.80)
RDW: 13.4 % (ref 11.6–15.4)
WBC: 5.9 10*3/uL (ref 3.4–10.8)

## 2020-09-25 LAB — HEMOGLOBIN A1C
Est. average glucose Bld gHb Est-mCnc: 229 mg/dL
Hgb A1c MFr Bld: 9.6 % — ABNORMAL HIGH (ref 4.8–5.6)

## 2020-09-25 MED ORDER — METFORMIN HCL ER 500 MG PO TB24: 500.0000 mg | ORAL_TABLET | Freq: Every day | ORAL | 2 refills | Status: DC

## 2020-09-25 NOTE — Telephone Encounter (Signed)
[place], we'll just seem him the 7th and will schedule diabetes follow up at that time. Thanks.

## 2020-09-25 NOTE — Telephone Encounter (Signed)
Patient advised and verbalized understanding. Patient agrees to referral and adding Metformin. Referral order placed please schedule.   Prescription sent into pharmacy. Patient has a follow up appointment already scheduled on 10/22/2020 for a blood pressure follow up. Is this appointment date ok to do the Diabetes follow up too, or should I push it out a little further?

## 2020-09-25 NOTE — Telephone Encounter (Signed)
-----   Message from Birdie Sons, MD sent at 09/25/2020  7:48 AM EST ----- Red blood cell count has gotten unusually high. He needs referral to hematology for polycythemia.  A1c is up to 9.6. continue faxiga, need to ADD metformin ER 538m once a day, #30, rf x 2. Need to schedule diabetes follow up 6 weeks.

## 2020-09-26 ENCOUNTER — Emergency Department: Payer: Medicare HMO

## 2020-09-26 ENCOUNTER — Other Ambulatory Visit: Payer: Self-pay

## 2020-09-26 ENCOUNTER — Encounter: Payer: Self-pay | Admitting: Emergency Medicine

## 2020-09-26 ENCOUNTER — Inpatient Hospital Stay
Admission: EM | Admit: 2020-09-26 | Discharge: 2020-09-28 | DRG: 281 | Disposition: A | Discharge status: Home / Self Care | Payer: Medicare HMO | Attending: Student | Admitting: Student

## 2020-09-26 DIAGNOSIS — Z9049 Acquired absence of other specified parts of digestive tract: Secondary | ICD-10-CM

## 2020-09-26 DIAGNOSIS — F419 Anxiety disorder, unspecified: Secondary | ICD-10-CM | POA: Diagnosis present

## 2020-09-26 DIAGNOSIS — I2510 Atherosclerotic heart disease of native coronary artery without angina pectoris: Secondary | ICD-10-CM | POA: Diagnosis present

## 2020-09-26 DIAGNOSIS — Z7901 Long term (current) use of anticoagulants: Secondary | ICD-10-CM

## 2020-09-26 DIAGNOSIS — I25118 Atherosclerotic heart disease of native coronary artery with other forms of angina pectoris: Secondary | ICD-10-CM | POA: Diagnosis not present

## 2020-09-26 DIAGNOSIS — I10 Essential (primary) hypertension: Secondary | ICD-10-CM | POA: Diagnosis present

## 2020-09-26 DIAGNOSIS — Z86010 Personal history of colonic polyps: Secondary | ICD-10-CM

## 2020-09-26 DIAGNOSIS — K900 Celiac disease: Secondary | ICD-10-CM | POA: Diagnosis present

## 2020-09-26 DIAGNOSIS — J449 Chronic obstructive pulmonary disease, unspecified: Secondary | ICD-10-CM | POA: Diagnosis present

## 2020-09-26 DIAGNOSIS — F339 Major depressive disorder, recurrent, unspecified: Secondary | ICD-10-CM | POA: Diagnosis not present

## 2020-09-26 DIAGNOSIS — Z7951 Long term (current) use of inhaled steroids: Secondary | ICD-10-CM

## 2020-09-26 DIAGNOSIS — I130 Hypertensive heart and chronic kidney disease with heart failure and stage 1 through stage 4 chronic kidney disease, or unspecified chronic kidney disease: Secondary | ICD-10-CM | POA: Diagnosis not present

## 2020-09-26 DIAGNOSIS — Z818 Family history of other mental and behavioral disorders: Secondary | ICD-10-CM

## 2020-09-26 DIAGNOSIS — E669 Obesity, unspecified: Secondary | ICD-10-CM | POA: Diagnosis present

## 2020-09-26 DIAGNOSIS — M1990 Unspecified osteoarthritis, unspecified site: Secondary | ICD-10-CM | POA: Diagnosis present

## 2020-09-26 DIAGNOSIS — Z882 Allergy status to sulfonamides status: Secondary | ICD-10-CM

## 2020-09-26 DIAGNOSIS — I480 Paroxysmal atrial fibrillation: Secondary | ICD-10-CM | POA: Diagnosis not present

## 2020-09-26 DIAGNOSIS — Z87891 Personal history of nicotine dependence: Secondary | ICD-10-CM

## 2020-09-26 DIAGNOSIS — I4891 Unspecified atrial fibrillation: Secondary | ICD-10-CM | POA: Diagnosis present

## 2020-09-26 DIAGNOSIS — M5136 Other intervertebral disc degeneration, lumbar region: Secondary | ICD-10-CM | POA: Diagnosis present

## 2020-09-26 DIAGNOSIS — N182 Chronic kidney disease, stage 2 (mild): Secondary | ICD-10-CM | POA: Diagnosis present

## 2020-09-26 DIAGNOSIS — E871 Hypo-osmolality and hyponatremia: Secondary | ICD-10-CM | POA: Diagnosis not present

## 2020-09-26 DIAGNOSIS — E1121 Type 2 diabetes mellitus with diabetic nephropathy: Secondary | ICD-10-CM | POA: Diagnosis not present

## 2020-09-26 DIAGNOSIS — G894 Chronic pain syndrome: Secondary | ICD-10-CM | POA: Diagnosis present

## 2020-09-26 DIAGNOSIS — Z955 Presence of coronary angioplasty implant and graft: Secondary | ICD-10-CM

## 2020-09-26 DIAGNOSIS — E782 Mixed hyperlipidemia: Secondary | ICD-10-CM | POA: Diagnosis present

## 2020-09-26 DIAGNOSIS — I208 Other forms of angina pectoris: Secondary | ICD-10-CM | POA: Diagnosis not present

## 2020-09-26 DIAGNOSIS — Z20822 Contact with and (suspected) exposure to covid-19: Secondary | ICD-10-CM | POA: Diagnosis present

## 2020-09-26 DIAGNOSIS — E785 Hyperlipidemia, unspecified: Secondary | ICD-10-CM | POA: Diagnosis not present

## 2020-09-26 DIAGNOSIS — F5101 Primary insomnia: Secondary | ICD-10-CM | POA: Diagnosis present

## 2020-09-26 DIAGNOSIS — I429 Cardiomyopathy, unspecified: Secondary | ICD-10-CM | POA: Diagnosis present

## 2020-09-26 DIAGNOSIS — E1165 Type 2 diabetes mellitus with hyperglycemia: Secondary | ICD-10-CM | POA: Diagnosis present

## 2020-09-26 DIAGNOSIS — Z7984 Long term (current) use of oral hypoglycemic drugs: Secondary | ICD-10-CM

## 2020-09-26 DIAGNOSIS — Z79899 Other long term (current) drug therapy: Secondary | ICD-10-CM

## 2020-09-26 DIAGNOSIS — Z6833 Body mass index (BMI) 33.0-33.9, adult: Secondary | ICD-10-CM

## 2020-09-26 DIAGNOSIS — Z833 Family history of diabetes mellitus: Secondary | ICD-10-CM

## 2020-09-26 DIAGNOSIS — I214 Non-ST elevation (NSTEMI) myocardial infarction: Secondary | ICD-10-CM | POA: Diagnosis not present

## 2020-09-26 DIAGNOSIS — Z7982 Long term (current) use of aspirin: Secondary | ICD-10-CM

## 2020-09-26 DIAGNOSIS — Z8249 Family history of ischemic heart disease and other diseases of the circulatory system: Secondary | ICD-10-CM

## 2020-09-26 DIAGNOSIS — I5042 Chronic combined systolic (congestive) and diastolic (congestive) heart failure: Secondary | ICD-10-CM | POA: Diagnosis not present

## 2020-09-26 DIAGNOSIS — N400 Enlarged prostate without lower urinary tract symptoms: Secondary | ICD-10-CM | POA: Diagnosis present

## 2020-09-26 DIAGNOSIS — K219 Gastro-esophageal reflux disease without esophagitis: Secondary | ICD-10-CM | POA: Diagnosis present

## 2020-09-26 DIAGNOSIS — Z87442 Personal history of urinary calculi: Secondary | ICD-10-CM

## 2020-09-26 DIAGNOSIS — Z888 Allergy status to other drugs, medicaments and biological substances status: Secondary | ICD-10-CM

## 2020-09-26 DIAGNOSIS — R079 Chest pain, unspecified: Secondary | ICD-10-CM | POA: Diagnosis not present

## 2020-09-26 DIAGNOSIS — R0789 Other chest pain: Secondary | ICD-10-CM | POA: Diagnosis not present

## 2020-09-26 DIAGNOSIS — G4733 Obstructive sleep apnea (adult) (pediatric): Secondary | ICD-10-CM | POA: Diagnosis present

## 2020-09-26 DIAGNOSIS — I5022 Chronic systolic (congestive) heart failure: Secondary | ICD-10-CM | POA: Diagnosis not present

## 2020-09-26 DIAGNOSIS — Z951 Presence of aortocoronary bypass graft: Secondary | ICD-10-CM

## 2020-09-26 DIAGNOSIS — M109 Gout, unspecified: Secondary | ICD-10-CM | POA: Diagnosis present

## 2020-09-26 DIAGNOSIS — Z85828 Personal history of other malignant neoplasm of skin: Secondary | ICD-10-CM

## 2020-09-26 DIAGNOSIS — I252 Old myocardial infarction: Secondary | ICD-10-CM

## 2020-09-26 DIAGNOSIS — Z885 Allergy status to narcotic agent status: Secondary | ICD-10-CM

## 2020-09-26 LAB — BASIC METABOLIC PANEL
Anion gap: 9 (ref 5–15)
BUN: 15 mg/dL (ref 8–23)
CO2: 30 mmol/L (ref 22–32)
Calcium: 9.2 mg/dL (ref 8.9–10.3)
Chloride: 93 mmol/L — ABNORMAL LOW (ref 98–111)
Creatinine, Ser: 1.24 mg/dL (ref 0.61–1.24)
GFR, Estimated: >60 mL/min (ref >60)
Glucose, Bld: 211 mg/dL — ABNORMAL HIGH (ref 70–99)
Potassium: 3.6 mmol/L (ref 3.5–5.1)
Sodium: 132 mmol/L — ABNORMAL LOW (ref 135–145)

## 2020-09-26 LAB — CBC
HCT: 58.3 % — ABNORMAL HIGH (ref 39.0–52.0)
Hemoglobin: 19.6 g/dL — ABNORMAL HIGH (ref 13.0–17.0)
MCH: 31.4 pg (ref 26.0–34.0)
MCHC: 33.6 g/dL (ref 30.0–36.0)
MCV: 93.3 fL (ref 80.0–100.0)
Platelets: 205 10*3/uL (ref 150–400)
RBC: 6.25 MIL/uL — ABNORMAL HIGH (ref 4.22–5.81)
RDW: 13.6 % (ref 11.5–15.5)
WBC: 9.4 10*3/uL (ref 4.0–10.5)
nRBC: 0 % (ref 0.0–0.2)

## 2020-09-26 LAB — CBG MONITORING, ED
Glucose-Capillary: 200 mg/dL — ABNORMAL HIGH (ref 70–99)
Glucose-Capillary: 234 mg/dL — ABNORMAL HIGH (ref 70–99)

## 2020-09-26 LAB — RESPIRATORY PANEL BY RT PCR (FLU A&B, COVID)
Influenza A by PCR: NEGATIVE
Influenza B by PCR: NEGATIVE
SARS Coronavirus 2 by RT PCR: NEGATIVE

## 2020-09-26 LAB — APTT: aPTT: 34 seconds (ref 24–36)

## 2020-09-26 LAB — HEPARIN LEVEL (UNFRACTIONATED): Heparin Unfractionated: 1.62 IU/mL — ABNORMAL HIGH (ref 0.30–0.70)

## 2020-09-26 LAB — TROPONIN I (HIGH SENSITIVITY)
Troponin I (High Sensitivity): 31 ng/L — ABNORMAL HIGH (ref <18)
Troponin I (High Sensitivity): 33 ng/L — ABNORMAL HIGH (ref <18)

## 2020-09-26 MED ORDER — ONDANSETRON HCL 4 MG/2ML IJ SOLN
INTRAMUSCULAR | Status: AC
  Administered 2020-09-26: 4 mg via INTRAVENOUS
  Filled 2020-09-26: qty 2

## 2020-09-26 MED ORDER — ONDANSETRON HCL 4 MG/2ML IJ SOLN: 4.0000 mg | Freq: Once | INTRAMUSCULAR | Status: AC

## 2020-09-26 MED ORDER — HEPARIN BOLUS VIA INFUSION: 4000.0000 [IU] | Freq: Once | INTRAVENOUS | Status: DC

## 2020-09-26 MED ORDER — FENTANYL CITRATE (PF) 100 MCG/2ML IJ SOLN
50.0000 ug | Freq: Once | INTRAMUSCULAR | Status: AC
  Administered 2020-09-26: 50 ug via INTRAVENOUS
  Filled 2020-09-26: qty 2

## 2020-09-26 MED ORDER — NITROGLYCERIN IN D5W 200-5 MCG/ML-% IV SOLN
0.0000 ug/min | INTRAVENOUS | Status: DC
  Administered 2020-09-26: 5 ug/min via INTRAVENOUS
  Filled 2020-09-26: qty 250

## 2020-09-26 MED ORDER — MORPHINE SULFATE (PF) 2 MG/ML IV SOLN
2.0000 mg | INTRAVENOUS | Status: DC | PRN
  Administered 2020-09-26 – 2020-09-27 (×3): 2 mg via INTRAVENOUS
  Filled 2020-09-26 (×3): qty 1

## 2020-09-26 MED ORDER — HEPARIN (PORCINE) 25000 UT/250ML-% IV SOLN
1300.0000 [IU]/h | INTRAVENOUS | Status: DC
  Administered 2020-09-26: 1150 [IU]/h via INTRAVENOUS
  Administered 2020-09-27: 1350 [IU]/h via INTRAVENOUS
  Administered 2020-09-28: 1300 [IU]/h via INTRAVENOUS
  Filled 2020-09-26 (×3): qty 250

## 2020-09-26 MED ORDER — INSULIN ASPART 100 UNIT/ML ~~LOC~~ SOLN
0.0000 [IU] | SUBCUTANEOUS | Status: DC
  Administered 2020-09-26 – 2020-09-27 (×2): 5 [IU] via SUBCUTANEOUS
  Administered 2020-09-27 (×2): 3 [IU] via SUBCUTANEOUS
  Administered 2020-09-27 (×3): 5 [IU] via SUBCUTANEOUS
  Administered 2020-09-28 (×3): 3 [IU] via SUBCUTANEOUS
  Filled 2020-09-26 (×10): qty 1

## 2020-09-26 MED ORDER — NITROGLYCERIN 0.4 MG SL SUBL
0.4000 mg | SUBLINGUAL_TABLET | SUBLINGUAL | Status: DC | PRN
  Administered 2020-09-26 (×3): 0.4 mg via SUBLINGUAL
  Filled 2020-09-26 (×2): qty 1

## 2020-09-26 NOTE — Consult Note (Addendum)
ANTICOAGULATION CONSULT NOTE - Initial Consult  Pharmacy Consult for Heparin Indication: chest pain/ACS  Allergies  Allergen Reactions  . Demerol  [Meperidine Hcl]   . Demerol [Meperidine] Hives  . Jardiance [Empagliflozin] Other (See Comments)    Perineal pain  . Prednisone Other (See Comments) and Hypertension    Pt states that this medication puts him in A-fib   . Sulfa Antibiotics Hives  . Albuterol Sulfate [Albuterol] Palpitations and Other (See Comments)    Pt currently uses this medication.    . Morphine Sulfate Nausea And Vomiting, Rash and Other (See Comments)    Pt states that he is only allergic to the tablet form of this medication.      Patient Measurements: Height: 5' 7"  (170.2 cm) Weight: 98.4 kg (217 lb) IBW/kg (Calculated) : 66.1 Heparin Dosing Weight: 87.4  Vital Signs: Temp: 97.7 F (36.5 C) (11/11 1801) Temp Source: Oral (11/11 1801) BP: 140/91 (11/11 1900) Pulse Rate: 62 (11/11 1915)  Labs: Recent Labs    09/24/20 1105 09/26/20 1800  HGB 20.5* 19.6*  HCT 62.2* 58.3*  PLT 164 205  CREATININE 0.94 1.24  TROPONINIHS  --  31*    Estimated Creatinine Clearance: 65.5 mL/min (by C-G formula based on SCr of 1.24 mg/dL).   Medical History: Past Medical History:  Diagnosis Date  . A-fib ([place])   . Anemia   . Anginal pain ([place])   . Anxiety   . Arthritis   . Asthma   . CAD (coronary artery disease)    a. 2002 CABGx2 (LIMA->LAD, VG->VG->OM1);  b. 09/2012 DES->OM;  c. 03/2015 PTCA of LAD [facility]) in setting of atretic LIMA; d. 05/2015 Cath [facility]): nonobs dzs; e. 06/2015 Cath ([facility]): LM nl, LAD 45p/d ISR, 50d, D1/2 small, LCX 50p/d ISR, OM1 70ost, 30 ISR, VG->OM1 50ost, 68m LIMA->LAD 99p/d - atretic, RCA dom, nl; f.cath 10/16: 40-50%(FFR 0.90) pLAD, 75% (FFR 0.77) mLAD s/p PCI/DES, oRCA 40% (FFR0.95)  . Cancer ([place]    SKIN CANCER ON BACK  . Celiac disease   . Chronic diastolic CHF (congestive heart failure) ([place]    a. 06/2009 Echo: EF 60-65%, Gr 1 DD,  triv AI, mildly dil LA, nl RV.  .Marland KitchenCOPD (chronic obstructive pulmonary disease) ([place]    a. Chronic bronchitis and emphysema.  . DDD (degenerative disc disease), lumbar   . Diverticulosis   . Dysrhythmia   . Essential hypertension   . GERD (gastroesophageal reflux disease)   . History of hiatal hernia   . History of kidney stones    H/O  . History of tobacco abuse    a. Quit 2014.  .Marland KitchenMyocardial infarction ([place] 2002   4 STENTS  . Pancreatitis   . PSVT (paroxysmal supraventricular tachycardia) ([place]    a. 10/2012 Noted on Zio Patch.  . Sleep apnea    LOST CORD TO CPAP -ONLY 02 @ BEDTIME  . Tubular adenoma of colon   . Type II diabetes mellitus (HCC)     Medications:  Pt on Eliquis 5 mg BID prior to admission   Assessment: 64year old male presenting with chest pain. PMH includes CAD s/p CABG and stent, afib, COPD, DM, HTN, HLD, arthritis, asthma, anxiety, anemia, and heart failure. Pt takes Eliquis prior to admission - reports last dose was this morning. Hgb 19.6, plt WNL.   Goal of Therapy:  Heparin level 0.3-0.7 units/ml aPTT 66-102 seconds Monitor platelets by anticoagulation protocol: Yes   Plan:  No bolus due to most recent  Eliquis dose being this morning Start heparin infusion at 1150 units/hr Check anti-Xa/aPTT level in 6 hours and daily while on heparin Continue to monitor H&H and platelets  Benn Moulder, PharmD Pharmacy Resident  09/26/2020 7:26 PM

## 2020-09-26 NOTE — ED Provider Notes (Signed)
[facility] Emergency Department Provider Note  ____________________________________________   First MD Initiated Contact with Patient 09/26/20 1839     (approximate)  I have reviewed the triage vital signs and the nursing notes.   HISTORY  Chief Complaint Chest Pain   HPI Lawrence [name] is a 64 y.o. male with a past medical history of CAD s/p CABG and  and placement of drug-eluting stents, A. fib anticoagulated on Eliquis and rhythm controlled on sotalol, COPD, remote tobacco abuse, DM, HTN, HDL, arthritis, asthma, anxiety, anemia, and heart failure who presents for assessment of substernal chest pressure rating to his back and neck that began around 10 AM today.  Patient states this feels similar to his prior heart attack.  No clear alleviating or aggravating factors including aspirin and sublingual nitroglycerin he took earlier today.  Patient denies any other acute symptoms but does endorse chronic cough which is not changed at all in quality or quantity.  Denies any new headache, earache, sore throat, shortness of breath, abdominal pain, vomiting, diarrhea, dysuria, rash, or extremity pain.  He does endorse a little bit of nausea intermittently over the course of today.  He denies any EtOH abuse or illicit drug use.  States he is compliant with all his medications including his Eliquis.         Past Medical History:  Diagnosis Date  . A-fib ([place])   . Anemia   . Anginal pain ([place])   . Anxiety   . Arthritis   . Asthma   . CAD (coronary artery disease)    a. 2002 CABGx2 (LIMA->LAD, VG->VG->OM1);  b. 09/2012 DES->OM;  c. 03/2015 PTCA of LAD [facility]) in setting of atretic LIMA; d. 05/2015 Cath [facility]): nonobs dzs; e. 06/2015 Cath ([facility]): LM nl, LAD 45p/d ISR, 50d, D1/2 small, LCX 50p/d ISR, OM1 70ost, 30 ISR, VG->OM1 50ost, 26m LIMA->LAD 99p/d - atretic, RCA dom, nl; f.cath 10/16: 40-50%(FFR 0.90) pLAD, 75% (FFR 0.77) mLAD s/p PCI/DES, oRCA 40% (FFR0.95)  . Cancer  ([place]    SKIN CANCER ON BACK  . Celiac disease   . Chronic diastolic CHF (congestive heart failure) ([place]    a. 06/2009 Echo: EF 60-65%, Gr 1 DD, triv AI, mildly dil LA, nl RV.  .Marland KitchenCOPD (chronic obstructive pulmonary disease) ([place]    a. Chronic bronchitis and emphysema.  . DDD (degenerative disc disease), lumbar   . Diverticulosis   . Dysrhythmia   . Essential hypertension   . GERD (gastroesophageal reflux disease)   . History of hiatal hernia   . History of kidney stones    H/O  . History of tobacco abuse    a. Quit 2014.  .Marland KitchenMyocardial infarction (HCuero 2002   4 STENTS  . Pancreatitis   . PSVT (paroxysmal supraventricular tachycardia) ([place]    a. 10/2012 Noted on Zio Patch.  . Sleep apnea    LOST CORD TO CPAP -ONLY 02 @ BEDTIME  . Tubular adenoma of colon   . Type II diabetes mellitus ([facility]     Patient Active Problem List   Diagnosis Date Noted  . Primary insomnia 05/13/2020  . Recurrent major depressive disorder, remission status unspecified ([place] 03/29/2020  . Elevated troponin 03/07/2020  . Bereavement 09/09/2019  . Chronic respiratory failure with hypoxia ([place] 05/29/2019  . Morbid obesity ([place] 05/29/2019  . Trigger thumb of right hand 11/28/2018  . Eye pain, left 08/18/2018  . Acute on chronic heart failure ([place] 04/18/2018  . History of adenomatous  polyp of colon 04/05/2018  . Abdominal pain, chronic, epigastric 11/06/2017  . Bilateral flank pain 03/24/2017  . Dyspnea 04/03/2016  . Hypotension 04/03/2016  . CKD (chronic kidney disease) stage 2, GFR 60-89 ml/min 04/03/2016  . Anemia 04/03/2016  . Paroxysmal atrial fibrillation ([place]) 12/23/2015  . OSA (obstructive sleep apnea) 12/10/2015  . Left inguinal hernia 11/07/2015  . Anxiety 11/07/2015  . Unstable angina ([place]) 10/05/2015  . Back pain with left-sided radiculopathy 09/30/2015  . Nocturnal hypoxia 09/06/2015  . BPH (benign prostatic hyperplasia) 08/01/2015  . Chronic diastolic CHF (congestive heart  failure) ([place])   . Angina pectoris ([place])   . Chest pain 07/11/2015  . COPD (chronic obstructive pulmonary disease) ([place]) 07/03/2015  . CAD (coronary artery disease) 06/26/2015  . HTN (hypertension) 06/26/2015  . 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[place]) 06/26/2015  . Achalasia 07/24/2014  . GERD (gastroesophageal reflux disease) 06/07/2014  . Former tobacco use 04/11/2013  . HLD (hyperlipidemia) 04/09/2013    Past Surgical History:  Procedure Laterality Date  . BYPASS GRAFT    . CARDIAC CATHETERIZATION N/A 07/12/2015   rocedure: Left Heart Cath and Cors/Grafts Angiography;  Surgeon: Belva Crome, MD;  Location: [place] CV LAB;  Service: Cardiovascular;  Laterality: N/A;  . CARDIAC CATHETERIZATION Right 10/07/2015   Procedure: Left Heart Cath and Cors/Grafts Angiography;  Surgeon: Dionisio David, MD;  Location: [place] CV LAB;  Service: Cardiovascular;  Laterality: Right;  . CARDIAC CATHETERIZATION N/A 04/06/2016   Procedure: Left Heart Cath and Coronary Angiography;  Surgeon: Yolonda Kida, MD;  Location: [place] [facility] LAB;  Service: Cardiovascular;  Laterality: N/A;  . CARDIAC CATHETERIZATION  04/06/2016   Procedure: Bypass Graft Angiography;  Surgeon: Yolonda Kida, MD;  Location: [place] CV LAB;  Service: Cardiovascular;;  . CARDIAC CATHETERIZATION N/A 11/02/2016   Procedure: Left Heart Cath and Cors/Grafts Angiography and possible PCI;  Surgeon: Yolonda Kida, MD;  Location: [place] CV LAB;  Service: Cardiovascular;  Laterality: N/A;  . CARDIAC CATHETERIZATION N/A 11/02/2016   Procedure: Coronary Stent Intervention;  Surgeon: Yolonda Kida, MD;  Location: [place] CV LAB;  Service: Cardiovascular;  Laterality: N/A;  . CHOLECYSTECTOMY    . CIRCUMCISION N/A 06/09/2019   Procedure: CIRCUMCISION ADULT;  Surgeon: Billey Co, MD;  Location: [facility] ORS;  Service: Urology;  Laterality: N/A;  . COLONOSCOPY WITH PROPOFOL N/A 04/01/2018    Procedure: COLONOSCOPY WITH PROPOFOL;  Surgeon: Manya Silvas, MD;  Location[facility] ENDOSCOPY;  Service: Endoscopy;  Laterality: N/A;  . ESOPHAGEAL DILATION    . ESOPHAGOGASTRODUODENOSCOPY (EGD) WITH PROPOFOL N/A 04/01/2018   Procedure: ESOPHAGOGASTRODUODENOSCOPY (EGD) WITH PROPOFOL;  Surgeon: Manya Silvas, MD;  [facility];  Service: Endoscopy;  Laterality: N/A;  . LEFT HEART CATH AND CORS/GRAFTS ANGIOGRAPHY N/A 06/12/2019   Procedure: LEFT HEART CATH AND CORS/GRAFTS ANGIOGRAPHY;  Surgeon: Teodoro Spray, MD;  Location: [place] CV LAB;  Service: Cardiovascular;  Laterality: N/A;  . LEFT HEART CATH AND CORS/GRAFTS ANGIOGRAPHY N/A 03/11/2020   Procedure: LEFT HEART CATH AND CORS/GRAFTS ANGIOGRAPHY;  Surgeon: Isaias Cowman, MD;  Location: [place] CV LAB;  Service: Cardiovascular;  Laterality: N/A;  . TONSILLECTOMY    . VASCULAR SURGERY      Prior to Admission medications   Medication Sig Start Date End Date Taking? Authorizing Provider  ELIQUIS 5 MG TABS tablet TAKE 1 TABLET BY MOUTH TWICE A DAY 05/01/20  Yes Birdie Sons, MD  albuterol (VENTOLIN HFA) 108 (90  Base) MCG/ACT inhaler INHALE 2 PUFFS BY MOUTH EVERY 6 HOURS AS NEEDED FOR SHORTNESS OF BREATH 05/13/20   Birdie Sons, MD  Albuterol Sulfate, sensor, 108 (90 Base) MCG/ACT AEPB Inhale 2 puffs into the lungs at bedtime. AND PRN    [provider]  allopurinol (ZYLOPRIM) 300 MG tablet TAKE 1 TABLET BY MOUTH TWICE A DAY 08/20/20   Birdie Sons, MD  ALPRAZolam Duanne Moron) 1 MG tablet TAKE ONE TABLET BY MOUTH THREE TIMES A DAY 09/19/20   Birdie Sons, MD  amLODipine (NORVASC) 2.5 MG tablet Take 1 tablet (2.5 mg total) by mouth daily. 09/24/20   Birdie Sons, MD  aspirin 81 MG chewable tablet Chew 81 mg by mouth daily.    [provider]  atorvastatin (LIPITOR) 80 MG tablet TAKE ONE TABLET BY MOUTH AT BEDTIME 07/19/20   Birdie Sons, MD  BREO ELLIPTA 100-25 MCG/INH AEPB INHALE 1  PUFF INTO THE LUNGS DAILY 02/03/20   Birdie Sons, MD  budesonide (PULMICORT) 0.5 MG/2ML nebulizer solution Take 0.5 mg by nebulization 2 (two) times daily. As needed    [provider]  cetirizine (ZYRTEC) 10 MG tablet Take 1 tablet (10 mg total) by mouth at bedtime. 05/01/20   Birdie Sons, MD  Cholecalciferol (VITAMIN D3) 125 MCG (5000 UT) CAPS Take 5,000 Units by mouth daily.     [provider]  docusate sodium (COLACE) 100 MG capsule Take 100 mg by mouth daily. regular OTC stool softner    [provider]  FARXIGA 10 MG TABS tablet TAKE 1 TABLET BY MOUTH DAILY 06/13/20   Birdie Sons, MD  isosorbide mononitrate (IMDUR) 120 MG 24 hr tablet TAKE 1 TABLET BY MOUTH DAILY 06/13/20   Birdie Sons, MD  LINZESS 72 MCG capsule Take 72 mcg by mouth daily before breakfast.     [provider]  lisinopril (ZESTRIL) 10 MG tablet TAKE 1 TABLET BY MOUTH DAILY 08/19/20   Birdie Sons, MD  magnesium oxide (MAG-OX) 400 (241.3 Mg) MG tablet Take 1 tablet (400 mg total) by mouth every morning. 06/13/20   Birdie Sons, MD  metaxalone (SKELAXIN) 800 MG tablet Take 1 tablet (800 mg total) by mouth 3 (three) times daily as needed (pain). 09/24/20   Birdie Sons, MD  metFORMIN (GLUCOPHAGE-XR) 500 MG 24 hr tablet Take 1 tablet (500 mg total) by mouth daily with breakfast. 09/25/20   Birdie Sons, MD  naloxone [facility]) nasal spray 4 mg/0.1 mL 1 spray into nostril x1 and may repeat every 2-3 minutes until patient is responsive or EMS arrives 08/21/20   Birdie Sons, MD  nitroGLYCERIN (NITROSTAT) 0.4 MG SL tablet PLACE ONE TABLET UNDER THE TONGUE EVERY 5 MINUTES FOR CHEST PAIN. IF NO RELIEF PAST 3RD TAB GO TO EMERGENCY ROOM 05/10/20   Birdie Sons, MD  omega-3 acid ethyl esters (LOVAZA) 1 g capsule TAKE 4 CAPSULES BY MOUTH DAILY 08/12/20   Birdie Sons, MD  omeprazole (PRILOSEC) 40 MG capsule Take 40 mg by mouth daily.  03/19/20   [provider]   oxyCODONE-acetaminophen (PERCOCET) 10-325 MG tablet TAKE 1 TABLET BY MOUTH EVERY 4 HOURS AS NEEDED FOR PAIN 09/19/20   Birdie Sons, MD  ranolazine (RANEXA) 1000 MG SR tablet TAKE 1 TABLET BY MOUTH TWICE A DAY 08/19/20   Birdie Sons, MD  sotalol (BETAPACE) 80 MG tablet Take 80 mg by mouth 2 (two) times daily.  [provider]  tiotropium (SPIRIVA) 18 MCG inhalation capsule Place 18 mcg into inhaler and inhale daily.    [provider]  torsemide (DEMADEX) 100 MG tablet Take 0.5 tablets (50 mg total) by mouth daily. 05/13/20   Birdie Sons, MD  traZODone (DESYREL) 150 MG tablet TAKE ONE TABLET BY MOUTH AT BEDTIME 08/20/20   Birdie Sons, MD  venlafaxine XR (EFFEXOR-XR) 75 MG 24 hr capsule TAKE 1 CAPSULE BY MOUTH DAILY WITH BREAKFAST 08/19/20   Birdie Sons, MD    Allergies Demerol  [meperidine hcl], Demerol [meperidine], Jardiance [empagliflozin], Prednisone, Sulfa antibiotics, Albuterol sulfate [albuterol], and Morphine sulfate  Family History  Problem Relation Age of Onset  . Heart attack Mother   . Depression Mother   . Heart disease Mother   . COPD Mother   . Hypertension Mother   . Heart attack Father   . Diabetes Father   . Depression Father   . Heart disease Father   . Cirrhosis Father   . Parkinson's disease Brother     Social History Social History   Tobacco Use  . Smoking status: Former Smoker    Packs/day: 3.00    Years: 50.00    Pack years: 150.00    Types: Cigarettes    Quit date: 04/22/2013    Years since quitting: 7.4  . Smokeless tobacco: Never Used  Vaping Use  . Vaping Use: Never used  Substance Use Topics  . Alcohol use: No    Comment: remotely quit alcohol use. Hx of heavy alcohol use.  . Drug use: Yes    Types: Marijuana    Comment: occasionally    Review of Systems  Review of Systems  Constitutional: Negative for chills and fever.  HENT: Negative for sore throat.   Eyes: Negative for pain.  Respiratory:  Positive for cough ( chronic). Negative for stridor.   Cardiovascular: Positive for chest pain.  Gastrointestinal: Negative for vomiting.  Genitourinary: Negative for dysuria.  Musculoskeletal: Negative for myalgias.  Skin: Negative for rash.  Neurological: Negative for seizures, loss of consciousness and headaches.  Psychiatric/Behavioral: Negative for suicidal ideas.  All other systems reviewed and are negative.     ____________________________________________   PHYSICAL EXAM:  VITAL SIGNS: ED Triage Vitals  Enc Vitals Group     BP 09/26/20 1801 (!) 145/90     Pulse Rate 09/26/20 1801 65     Resp 09/26/20 1801 18     Temp 09/26/20 1801 97.7 F (36.5 C)     Temp Source 09/26/20 1801 Oral     SpO2 09/26/20 1801 92 %     Weight 09/26/20 1757 217 lb (98.4 kg)     Height 09/26/20 1757 5' 7"  (1.702 m)     Head Circumference --      Peak Flow --      Pain Score 09/26/20 1756 9     Pain Loc --      Pain Edu? --      Excl. in [place]? --    Vitals:   09/26/20 1850 09/26/20 1855  BP:    Pulse: (!) 56 (!) 58  Resp: 16 12  Temp:    SpO2: 94% 97%   Physical Exam Vitals and nursing note reviewed.  Constitutional:      Appearance: He is well-developed.  HENT:     Head: Normocephalic and atraumatic.     Right Ear: External ear normal.     Left Ear: External ear normal.  Nose: Nose normal.  Eyes:     Conjunctiva/sclera: Conjunctivae normal.  Cardiovascular:     Rate and Rhythm: Normal rate. Rhythm irregular.     Pulses: Normal pulses.     Heart sounds: Murmur heard.   Pulmonary:     Effort: Pulmonary effort is normal. No respiratory distress.     Breath sounds: Normal breath sounds.  Abdominal:     Palpations: Abdomen is soft.     Tenderness: There is no abdominal tenderness.  Musculoskeletal:     Cervical back: Neck supple.  Skin:    General: Skin is warm and dry.  Neurological:     Mental Status: He is alert and oriented to person, place, and time.   Psychiatric:        Mood and Affect: Mood normal.      ____________________________________________   LABS (all labs ordered are listed, but only abnormal results are displayed)  Labs Reviewed  BASIC METABOLIC PANEL - Abnormal; Notable for the following components:      Result Value   Sodium 132 (*)    Chloride 93 (*)    Glucose, Bld 211 (*)    All other components within normal limits  CBC - Abnormal; Notable for the following components:   RBC 6.25 (*)    Hemoglobin 19.6 (*)    HCT 58.3 (*)    All other components within normal limits  TROPONIN I (HIGH SENSITIVITY) - Abnormal; Notable for the following components:   Troponin I (High Sensitivity) 31 (*)    All other components within normal limits  RESPIRATORY PANEL BY RT PCR (FLU A&B, COVID)  APTT  HEPARIN LEVEL (UNFRACTIONATED)   ____________________________________________  EKG  Sinus rhythm with ventricular rate 64, normal axis, unremarkable intervals, T wave inversion in the lateral leads and inferior leads.  Is also a degree of sinus arrhythmia. ____________________________________________  RADIOLOGY  ED MD interpretation: No overt edema, focal consolidation, widened mediastinum, pneumothorax, large effusion, or other clear acute thoracic process.  Sternotomy wires from prior CABG surgery are visible and appear intact.  Official radiology report(s): DG Chest 2 View  Result Date: 09/26/2020 CLINICAL DATA:  Chest pain EXAM: CHEST - 2 VIEW COMPARISON:  08/10/2020 FINDINGS: Post sternotomy changes. No focal opacity or pleural effusion. Stable cardiomediastinal silhouette with aortic atherosclerosis. No pneumothorax. IMPRESSION: No active cardiopulmonary disease. Electronically Signed   By: Donavan Foil M.D.   On: 09/26/2020 18:54    ____________________________________________   PROCEDURES  Procedure(s) performed (including Critical Care):  .Critical Care Performed by: Lucrezia Starch, MD Authorized by:  Lucrezia Starch, MD   Critical care provider statement:    Critical care time (minutes):  45   Critical care time was exclusive of:  Separately billable procedures and treating other patients   Critical care was necessary to treat or prevent imminent or life-threatening deterioration of the following conditions:  Cardiac failure   Critical care was time spent personally by me on the following activities:  Discussions with consultants, evaluation of patient's response to treatment, examination of patient, ordering and performing treatments and interventions, ordering and review of laboratory studies, ordering and review of radiographic studies, pulse oximetry, re-evaluation of patient's condition, obtaining history from patient or surrogate and review of old charts  .1-3 Lead EKG Interpretation Performed by: Lucrezia Starch, MD Authorized by: Lucrezia Starch, MD     Interpretation: abnormal     ECG rate assessment: normal     Rhythm: sinus rhythm  Ectopy: none     Conduction: abnormal       ____________________________________________   INITIAL IMPRESSION / ASSESSMENT AND PLAN / ED COURSE        Patient presents above to history exam for assessment of some substernal chest pain that began earlier today.  On arrival patient is hypertensive with a BP of 145/90 with otherwise stable vital signs on room air although he is borderline low SPO2 at 92%.  Differential includes but is not limited to ACS, PE, dissection, pneumonia, pneumothorax, anemia, metabolic derangements, and GI etiologies.  Low suspicion for PE as patient states he is compliant with his Eliquis and appropriately anticoagulated.  Low suspicion for dissection as patient does not described as ripping or tearing he has symmetric bilateral lower extremity pulses with a reassuring mediastinum on chest x-ray.  No significant arrhythmia on ECG although there is some evidence of new ischemic changes and given elevated  troponin concern for NSTEMI.  No evidence of pneumonia or pneumothorax on chest x-ray.  Patient does not appear acutely anemic and her no significant metabolic derangements although patient slightly hyperglycemic without evidence of acidosis.  Discussed patient's presentation work-up with on-call cardiologist Dr. Rockey Situ who recommended heparin and nitroglycerin admitting the patient to the hospital and stated he would see the patient.  Patient already took 4 tablets of 81 mg of ASA prior to arrival.  I will plan to admit to medicine service for further evaluation management.   ____________________________________________   FINAL CLINICAL IMPRESSION(S) / ED DIAGNOSES  Final diagnoses:  NSTEMI (non-ST elevated myocardial infarction) ([place])    Medications  nitroGLYCERIN (NITROSTAT) SL tablet 0.4 mg (0.4 mg Sublingual Given 09/26/20 1852)  heparin ADULT infusion 100 units/mL (25000 units/27m sodium chloride 0.45%) (has no administration in time range)  nitroGLYCERIN 50 mg in dextrose 5 % 250 mL (0.2 mg/mL) infusion (has no administration in time range)  insulin aspart (novoLOG) injection 0-15 Units (has no administration in time range)  fentaNYL (SUBLIMAZE) injection 50 mcg (50 mcg Intravenous Given 09/26/20 1853)  ondansetron (ZOFRAN) injection 4 mg (4 mg Intravenous Given 09/26/20 1902)     ED Discharge Orders    None       Note:  This document was prepared using Dragon voice recognition software and may include unintentional dictation errors.   SLucrezia Starch MD 09/26/20 1911

## 2020-09-26 NOTE — H&P (Signed)
[facility] History and Physical   Patient: Lawrence [name] KWI:[id number]   PCP: Birdie Sons, MD DOB: [birth year]   DOA: 09/26/2020   DOS: 09/26/2020   DOS: the patient was seen and examined on 09/26/2020   Patient coming from: The patient is coming from Home  Chief Complaint: Chest pain  HPI: Lawrence [name] is a 64 y.o. male with Past medical history of CAD cerebral CABG syrup with 4 stents, HTN, HLD, proximal A. fib on Eliquis, chronic systolic CHF LVEF 45 to 24%, NIDDM, COPD, OSA, BPH, anxiety, depression, insomnia, as reviewed from EMR, presented at [facility] ED due to chest pain.  As per patient he started having chest pain around 10 AM which was 6/10, felt middle of the chest radiated to back, constant sharp pain and squeezing, chest pain was getting worse, patient went to his son and his chest pain was getting worse so he asked his son to take him to the ED at that time chest pain was 9/10, constant and left-sided chest pain radiated to the back.  Patient was feeling nauseous, no vomiting.  Denies any abdominal pain no diarrhea.  Patient denies any palpitations, no shortness of breath, denies any headache and dizziness.    ED Course: EKG T wave inversion lateral and inferior leads, troponin 31, mild hyponatremia sodium 132, hyperglycemia blood glucose 211 Patient received nitroglycerin sublingual x2 but his chest pain did not relieve so patient was started on nitroglycerin infusion and heparin infusion was started Physician discussed with cardiologist who agreed with the treatment for non-NSTEMI   Review of Systems: as mentioned in the history of present illness.  All other systems reviewed and are negative.  Past Medical History:  Diagnosis Date  . A-fib ([place])   . Anemia   . Anginal pain ([place])   . Anxiety   . Arthritis   . Asthma   . CAD (coronary artery disease)    a. 2002 CABGx2 (LIMA->LAD, VG->VG->OM1);  b. 09/2012 DES->OM;  c. 03/2015 PTCA of LAD [facility]) in setting of  atretic LIMA; d. 05/2015 Cath [facility]): nonobs dzs; e. 06/2015 Cath ([facility]): LM nl, LAD 45p/d ISR, 50d, D1/2 small, LCX 50p/d ISR, OM1 70ost, 30 ISR, VG->OM1 50ost, 10m LIMA->LAD 99p/d - atretic, RCA dom, nl; f.cath 10/16: 40-50%(FFR 0.90) pLAD, 75% (FFR 0.77) mLAD s/p PCI/DES, oRCA 40% (FFR0.95)  . Cancer ([place]    SKIN CANCER ON BACK  . Celiac disease   . Chronic diastolic CHF (congestive heart failure) ([place]    a. 06/2009 Echo: EF 60-65%, Gr 1 DD, triv AI, mildly dil LA, nl RV.  .Marland KitchenCOPD (chronic obstructive pulmonary disease) ([place]    a. Chronic bronchitis and emphysema.  . DDD (degenerative disc disease), lumbar   . Diverticulosis   . Dysrhythmia   . Essential hypertension   . GERD (gastroesophageal reflux disease)   . History of hiatal hernia   . History of kidney stones    H/O  . History of tobacco abuse    a. Quit 2014.  .Marland KitchenMyocardial infarction ([place] 2002   4 STENTS  . Pancreatitis   . PSVT (paroxysmal supraventricular tachycardia) ([place]    a. 10/2012 Noted on Zio Patch.  . Sleep apnea    LOST CORD TO CPAP -ONLY 02 @ BEDTIME  . Tubular adenoma of colon   . Type II diabetes mellitus ([place]    Past Surgical History:  Procedure Laterality Date  . BYPASS GRAFT    . CARDIAC CATHETERIZATION N/A  07/12/2015   rocedure: Left Heart Cath and Cors/Grafts Angiography;  Surgeon: Belva Crome, MD;  Location: [place] CV LAB;  Service: Cardiovascular;  Laterality: N/A;  . CARDIAC CATHETERIZATION Right 10/07/2015   Procedure: Left Heart Cath and Cors/Grafts Angiography;  Surgeon: Dionisio David, MD;  Location: [place] CV LAB;  Service: Cardiovascular;  Laterality: Right;  . CARDIAC CATHETERIZATION N/A 04/06/2016   Procedure: Left Heart Cath and Coronary Angiography;  Surgeon: Yolonda Kida, MD;  Location: [place] CV LAB;  Service: Cardiovascular;  Laterality: N/A;  . CARDIAC CATHETERIZATION  04/06/2016   Procedure: Bypass Graft Angiography;  Surgeon: Yolonda Kida, MD;   Location: [place] CV LAB;  Service: Cardiovascular;;  . CARDIAC CATHETERIZATION N/A 11/02/2016   Procedure: Left Heart Cath and Cors/Grafts Angiography and possible PCI;  Surgeon: Yolonda Kida, MD;  Location: [place] CV LAB;  Service: Cardiovascular;  Laterality: N/A;  . CARDIAC CATHETERIZATION N/A 11/02/2016   Procedure: Coronary Stent Intervention;  Surgeon: Yolonda Kida, MD;  Location: [place] CV LAB;  Service: Cardiovascular;  Laterality: N/A;  . CHOLECYSTECTOMY    . CIRCUMCISION N/A 06/09/2019   Procedure: CIRCUMCISION ADULT;  Surgeon: Billey Co, MD;  Location: [facility] ORS;  Service: Urology;  Laterality: N/A;  . COLONOSCOPY WITH PROPOFOL N/A 04/01/2018   Procedure: COLONOSCOPY WITH PROPOFOL;  Surgeon: Manya Silvas, MD;  Location: [facility] ENDOSCOPY;  Service: Endoscopy;  Laterality: N/A;  . ESOPHAGEAL DILATION    . ESOPHAGOGASTRODUODENOSCOPY (EGD) WITH PROPOFOL N/A 04/01/2018   Procedure: ESOPHAGOGASTRODUODENOSCOPY (EGD) WITH PROPOFOL;  Surgeon: Manya Silvas, MD;  [facility];  Service: Endoscopy;  Laterality: N/A;  . LEFT HEART CATH AND CORS/GRAFTS ANGIOGRAPHY N/A 06/12/2019   Procedure: LEFT HEART CATH AND CORS/GRAFTS ANGIOGRAPHY;  Surgeon: Teodoro Spray, MD;  Location: [place] CV LAB;  Service: Cardiovascular;  Laterality: N/A;  . LEFT HEART CATH AND CORS/GRAFTS ANGIOGRAPHY N/A 03/11/2020   Procedure: LEFT HEART CATH AND CORS/GRAFTS ANGIOGRAPHY;  Surgeon: Isaias Cowman, MD;  Location: [place] CV LAB;  Service: Cardiovascular;  Laterality: N/A;  . TONSILLECTOMY    . VASCULAR SURGERY     Social History:  reports that he quit smoking about 7 years ago. His smoking use included cigarettes. He has a 150.00 pack-year smoking history. He has never used smokeless tobacco. He reports current drug use. Drug: Marijuana. He reports that he does not drink alcohol.  Allergies  Allergen Reactions  . Demerol  [Meperidine Hcl]   .  Demerol [Meperidine] Hives  . Jardiance [Empagliflozin] Other (See Comments)    Perineal pain  . Prednisone Other (See Comments) and Hypertension    Pt states that this medication puts him in A-fib   . Sulfa Antibiotics Hives  . Albuterol Sulfate [Albuterol] Palpitations and Other (See Comments)    Pt currently uses this medication.    . Morphine Sulfate Nausea And Vomiting, Rash and Other (See Comments)    Pt states that he is only allergic to the tablet form of this medication.       Family history reviewed and not pertinent Family History  Problem Relation Age of Onset  . Heart attack Mother   . Depression Mother   . Heart disease Mother   . COPD Mother   . Hypertension Mother   . Heart attack Father   . Diabetes Father   . Depression Father   . Heart disease Father   . Cirrhosis Father   . Parkinson's disease Brother  Prior to Admission medications   Medication Sig Start Date End Date Taking? Authorizing Provider  albuterol (VENTOLIN HFA) 108 (90 Base) MCG/ACT inhaler INHALE 2 PUFFS BY MOUTH EVERY 6 HOURS AS NEEDED FOR SHORTNESS OF BREATH Patient taking differently: Inhale 2 puffs into the lungs every 6 (six) hours as needed for wheezing or shortness of breath.  05/13/20  Yes [name], [name], MD  allopurinol (ZYLOPRIM) 300 MG tablet TAKE 1 TABLET BY MOUTH TWICE A DAY Patient taking differently: Take 300 mg by mouth 2 (two) times daily.  08/20/20  Yes Birdie Sons, MD  ALPRAZolam Duanne Moron) 1 MG tablet TAKE ONE TABLET BY MOUTH THREE TIMES A DAY Patient taking differently: Take 1 mg by mouth 3 (three) times daily.  09/19/20  Yes Birdie Sons, MD  amLODipine (NORVASC) 2.5 MG tablet Take 1 tablet (2.5 mg total) by mouth daily. 09/24/20  Yes Birdie Sons, MD  aspirin 81 MG chewable tablet Chew 81 mg by mouth daily.   Yes [provider]  atorvastatin (LIPITOR) 80 MG tablet TAKE ONE TABLET BY MOUTH AT BEDTIME Patient taking differently: Take 80 mg by mouth at  bedtime.  07/19/20  Yes [name], [name], MD  BREO ELLIPTA 100-25 MCG/INH AEPB INHALE 1 PUFF INTO THE LUNGS DAILY Patient taking differently: Inhale 1 puff into the lungs daily.  02/03/20  Yes Birdie Sons, MD  budesonide (PULMICORT) 0.5 MG/2ML nebulizer solution Take 0.5 mg by nebulization 2 (two) times daily as needed (shortness of breath or wheezing).    Yes [provider]  cetirizine (ZYRTEC) 10 MG tablet Take 1 tablet (10 mg total) by mouth at bedtime. 05/01/20  Yes Birdie Sons, MD  Cholecalciferol (VITAMIN D3) 125 MCG (5000 UT) CAPS Take 5,000 Units by mouth daily.    Yes [provider]  docusate sodium (COLACE) 100 MG capsule Take 100 mg by mouth daily.    Yes [provider]  ELIQUIS 5 MG TABS tablet TAKE 1 TABLET BY MOUTH TWICE A DAY 05/01/20  Yes [name], [name], MD  FARXIGA 10 MG TABS tablet TAKE 1 TABLET BY MOUTH DAILY Patient taking differently: Take 10 mg by mouth daily.  06/13/20  Yes Birdie Sons, MD  isosorbide mononitrate (IMDUR) 120 MG 24 hr tablet TAKE 1 TABLET BY MOUTH DAILY Patient taking differently: Take 120 mg by mouth daily.  06/13/20  Yes Birdie Sons, MD  LINZESS 72 MCG capsule Take 72 mcg by mouth daily before breakfast.    Yes [provider]  lisinopril (ZESTRIL) 10 MG tablet TAKE 1 TABLET BY MOUTH DAILY Patient taking differently: Take 10 mg by mouth daily.  08/19/20  Yes Birdie Sons, MD  magnesium oxide (MAG-OX) 400 (241.3 Mg) MG tablet Take 1 tablet (400 mg total) by mouth every morning. 06/13/20  Yes Birdie Sons, MD  metaxalone (SKELAXIN) 800 MG tablet Take 1 tablet (800 mg total) by mouth 3 (three) times daily as needed (pain). 09/24/20  Yes Birdie Sons, MD  metFORMIN (GLUCOPHAGE-XR) 500 MG 24 hr tablet Take 1 tablet (500 mg total) by mouth daily with breakfast. 09/25/20  Yes Birdie Sons, MD  naloxone [facility]) nasal spray 4 mg/0.1 mL 1 spray into nostril x1 and may repeat every 2-3 minutes until  patient is responsive or EMS arrives 08/21/20  Yes Birdie Sons, MD  nitroGLYCERIN (NITROSTAT) 0.4 MG SL tablet PLACE ONE TABLET UNDER THE TONGUE EVERY 5 MINUTES FOR CHEST PAIN. IF NO  RELIEF PAST 3RD TAB GO TO EMERGENCY ROOM Patient taking differently: Place 0.4 mg under the tongue every 5 (five) minutes as needed for chest pain.  05/10/20  Yes Birdie Sons, MD  omega-3 acid ethyl esters (LOVAZA) 1 g capsule TAKE 4 CAPSULES BY MOUTH DAILY Patient taking differently: Take 4 g by mouth daily.  08/12/20  Yes Birdie Sons, MD  omeprazole (PRILOSEC) 40 MG capsule Take 40 mg by mouth daily.  03/19/20  Yes [provider]  oxyCODONE-acetaminophen (PERCOCET) 10-325 MG tablet TAKE 1 TABLET BY MOUTH EVERY 4 HOURS AS NEEDED FOR PAIN Patient taking differently: Take 1 tablet by mouth every 4 (four) hours as needed for pain.  09/19/20  Yes Birdie Sons, MD  ranolazine (RANEXA) 1000 MG SR tablet TAKE 1 TABLET BY MOUTH TWICE A DAY Patient taking differently: Take 1,000 mg by mouth 2 (two) times daily.  08/19/20  Yes Birdie Sons, MD  sotalol (BETAPACE) 80 MG tablet Take 80 mg by mouth 2 (two) times daily.   Yes [provider]  tiotropium (SPIRIVA) 18 MCG inhalation capsule Place 18 mcg into inhaler and inhale daily.   Yes [provider]  torsemide (DEMADEX) 100 MG tablet Take 0.5 tablets (50 mg total) by mouth daily. 05/13/20  Yes Birdie Sons, MD  traZODone (DESYREL) 150 MG tablet TAKE ONE TABLET BY MOUTH AT BEDTIME Patient taking differently: Take 150 mg by mouth at bedtime.  08/20/20  Yes Birdie Sons, MD  venlafaxine XR (EFFEXOR-XR) 75 MG 24 hr capsule TAKE 1 CAPSULE BY MOUTH DAILY WITH BREAKFAST Patient taking differently: Take 75 mg by mouth daily with breakfast.  08/19/20  Yes Birdie Sons, MD    Physical Exam: Vitals:   09/26/20 1939 09/26/20 1945 09/26/20 1951 09/26/20 1955  BP: 128/90 127/82 122/87 130/74  Pulse: (!) 55 (!) 52 (!) 59 (!) 58  Resp:  (!) 8 13 11  (!) 6  Temp:      TempSrc:      SpO2: 98% 96% 98% 99%  Weight:      Height:        General: alert and oriented to time, place, and person. Appear in mild distress, affect appropriate Eyes: PERRLA, Conjunctiva normal ENT: Oral Mucosa Clear, moist  Neck: no JVD, no Abnormal Mass Or lumps Cardiovascular: S1 and S2 Present, no Murmur, peripheral pulses symmetrical Respiratory: good respiratory effort, Bilateral Air entry equal and Decreased, no signs of accessory muscle use, Clear to Auscultation, no Crackles, no wheezes Abdomen: Bowel Sound present, Soft and no tenderness, no hernia Skin: no rashes  Extremities: no Pedal edema, no calf tenderness Neurologic: without any new focal findings Gait not checked due to patient safety concerns  Data Reviewed: I have personally reviewed and interpreted labs, imaging as discussed below.  CBC: Recent Labs  Lab 09/24/20 1105 09/26/20 1800  WBC 5.9 9.4  HGB 20.5* 19.6*  HCT 62.2* 58.3*  MCV 93 93.3  PLT 164 867   Basic Metabolic Panel: Recent Labs  Lab 09/24/20 1105 09/26/20 1800  NA 139 132*  K 5.7* 3.6  CL 96 93*  CO2 28 30  GLUCOSE 256* 211*  BUN 8 15  CREATININE 0.94 1.24  CALCIUM 10.8* 9.2   GFR: Estimated Creatinine Clearance: 65.5 mL/min (by C-G formula based on SCr of 1.24 mg/dL). Liver Function Tests: Recent Labs  Lab 09/24/20 1105  AST 24  ALT 22  ALKPHOS 115  BILITOT 0.5  PROT 7.4  ALBUMIN  4.7   No results for input(s): LIPASE, AMYLASE in the last 168 hours. No results for input(s): AMMONIA in the last 168 hours. Coagulation Profile: No results for input(s): INR, PROTIME in the last 168 hours. Cardiac Enzymes: No results for input(s): CKTOTAL, CKMB, CKMBINDEX, TROPONINI in the last 168 hours. BNP (last 3 results) No results for input(s): PROBNP in the last 8760 hours. HbA1C: Recent Labs    09/24/20 1105  HGBA1C 9.6*   CBG: Recent Labs  Lab 09/26/20 1936  GLUCAP 234*   Lipid  Profile: No results for input(s): CHOL, HDL, LDLCALC, TRIG, CHOLHDL, LDLDIRECT in the last 72 hours. Thyroid Function Tests: Recent Labs    09/24/20 1105  TSH 1.430   Anemia Panel: No results for input(s): VITAMINB12, FOLATE, FERRITIN, TIBC, IRON, RETICCTPCT in the last 72 hours. Urine analysis:    Component Value Date/Time   COLORURINE AMBER (A) 10/03/2019 2002   APPEARANCEUR CLEAR (A) 10/03/2019 2002   APPEARANCEUR Clear 05/25/2019 1554   LABSPEC 1.037 (H) 10/03/2019 2002   LABSPEC 1.012 03/07/2015 2143   PHURINE 5.0 10/03/2019 2002   GLUCOSEU >=500 (A) 10/03/2019 2002   GLUCOSEU Negative 03/07/2015 2143   HGBUR NEGATIVE 10/03/2019 2002   BILIRUBINUR NEGATIVE 10/03/2019 2002   BILIRUBINUR Negative 05/25/2019 1554   BILIRUBINUR Negative 03/07/2015 2143   [place] NEGATIVE 10/03/2019 2002   PROTEINUR 100 (A) 10/03/2019 2002   UROBILINOGEN 0.2 03/26/2017 0907   NITRITE NEGATIVE 10/03/2019 2002   LEUKOCYTESUR NEGATIVE 10/03/2019 2002   LEUKOCYTESUR Negative 03/07/2015 2143    Radiological Exams on Admission: DG Chest 2 View  Result Date: 09/26/2020 CLINICAL DATA:  Chest pain EXAM: CHEST - 2 VIEW COMPARISON:  08/10/2020 FINDINGS: Post sternotomy changes. No focal opacity or pleural effusion. Stable cardiomediastinal silhouette with aortic atherosclerosis. No pneumothorax. IMPRESSION: No active cardiopulmonary disease. Electronically Signed   By: Donavan Foil M.D.   On: 09/26/2020 18:54    EKG: T wave inversion in lateral and inferior leads   I reviewed all nursing notes, pharmacy notes, vitals, pertinent old records.  Assessment/Plan  Principal Problem:   NSTEMI (non-ST elevated myocardial infarction) ([place]) Active Problems:   CAD (coronary artery disease)   HTN (hypertension)   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HCC)   HLD (hyperlipidemia)   COPD (chronic obstructive pulmonary disease) (HCC)   BPH (benign prostatic hyperplasia)   Paroxysmal atrial  fibrillation (HCC)   Recurrent major depressive disorder, remission status unspecified ([place])   Primary insomnia    # NSTEMI, presented with chest pain radiating to back Troponin elevated, continue to trend EKG T wave inversion in lateral and inferior leads Started nitroglycerin infusion due to persistent chest pain Started heparin IV infusion Continue aspirin 81 mg Po daily Continue monitor on telemetry, admitted in the progressive cardiac unit Follow cardiology for further recommendation Keep n.p.o. after midnight for possible cardiac cath tomorrow a.m.   # CAD, HTN, HLD, chronic systolic CHF LVEF 45 to 30%, proximal A. Fib Resume home medications aspirin, Lipitor, amlodipine, Imdur, lisinopril, Ranexa, sotalol, torsemide, Eliquis 5 mg p.o. twice daily Monitor BP and titrate medications accordingly  # NIDDM T2, held home medications for now Started Humalog sliding scale, monitor FSBG Hemoglobin A1c 9.6, poorly controlled diabetes Continue diabetic diet   #COPD, OSA Brio Ellipta inhaler, continue Spiriva and albuterol as needed   # Anxiety, depression, insomnia Continue home meds Effexor, trazodone, Xanax  # History of gout, resumed allopurinol Check uric acid level    Nutrition: Carb modified diet DVT  Prophylaxis: Therapeutic Anticoagulation with Heparin IV infusion  Advance goals of care discussion: Full code   Consults: Cardiologist consulted by ED physician   Family Communication: family was not present at bedside, at the time of interview.  Opportunity was given to ask question and all questions were answered satisfactorily.  Disposition: Admitted as inpatient, progressive cardiac unit. Likely to be discharged home, in 1-2 days .  I have discussed plan of care as described above with RN and patient/family.  Severity of Illness: The appropriate patient status for this patient is INPATIENT. Inpatient status is judged to be reasonable and necessary in order to  provide the required intensity of service to ensure the patient's safety. The patient's presenting symptoms, physical exam findings, and initial radiographic and laboratory data in the context of their chronic comorbidities is felt to place them at high risk for further clinical deterioration. Furthermore, it is not anticipated that the patient will be medically stable for discharge from the hospital within 2 midnights of admission. The following factors support the patient status of inpatient.   " The patient's presenting symptoms include chest pain. " The worrisome physical exam findings include chest pain. " The initial radiographic and laboratory data are worrisome because of NSTEMI. " The chronic co-morbidities include CAD.   * I certify that at the point of admission it is my clinical judgment that the patient will require inpatient hospital care spanning beyond 2 midnights from the point of admission due to high intensity of service, high risk for further deterioration and high frequency of surveillance required.*    Author: Val Riles, MD [facility] 09/26/2020 8:01 PM   To reach On-call, see care teams to locate the attending and reach out to them via [email or web address]. If 7PM-7AM, please contact night-coverage If you still have difficulty reaching the attending provider, please page the [facility] (Director on Call) for [facility] on amion for assistance.

## 2020-09-26 NOTE — ED Triage Notes (Addendum)
Pt arrives from home via POV. Pt states centralized chest pain with radiation to the back starting this morning. Pt c/o N/V with the pain. Pt also admits diaphoresis earlier today.    Hx of bypass surgery Hx of Stent and heart attack in 2002

## 2020-09-26 NOTE — ED Notes (Signed)
Second nitro given at [employer]

## 2020-09-27 ENCOUNTER — Other Ambulatory Visit: Payer: Self-pay

## 2020-09-27 DIAGNOSIS — I25118 Atherosclerotic heart disease of native coronary artery with other forms of angina pectoris: Secondary | ICD-10-CM

## 2020-09-27 DIAGNOSIS — G4733 Obstructive sleep apnea (adult) (pediatric): Secondary | ICD-10-CM

## 2020-09-27 DIAGNOSIS — I5022 Chronic systolic (congestive) heart failure: Secondary | ICD-10-CM

## 2020-09-27 DIAGNOSIS — E1165 Type 2 diabetes mellitus with hyperglycemia: Secondary | ICD-10-CM

## 2020-09-27 DIAGNOSIS — I10 Essential (primary) hypertension: Secondary | ICD-10-CM

## 2020-09-27 DIAGNOSIS — J449 Chronic obstructive pulmonary disease, unspecified: Secondary | ICD-10-CM

## 2020-09-27 DIAGNOSIS — I480 Paroxysmal atrial fibrillation: Secondary | ICD-10-CM

## 2020-09-27 LAB — BASIC METABOLIC PANEL
Anion gap: 8 (ref 5–15)
BUN: 18 mg/dL (ref 8–23)
CO2: 30 mmol/L (ref 22–32)
Calcium: 9.2 mg/dL (ref 8.9–10.3)
Chloride: 98 mmol/L (ref 98–111)
Creatinine, Ser: 1.02 mg/dL (ref 0.61–1.24)
GFR, Estimated: >60 mL/min (ref >60)
Glucose, Bld: 213 mg/dL — ABNORMAL HIGH (ref 70–99)
Potassium: 3.8 mmol/L (ref 3.5–5.1)
Sodium: 136 mmol/L (ref 135–145)

## 2020-09-27 LAB — CBC
HCT: 54.1 % — ABNORMAL HIGH (ref 39.0–52.0)
Hemoglobin: 18.3 g/dL — ABNORMAL HIGH (ref 13.0–17.0)
MCH: 31.6 pg (ref 26.0–34.0)
MCHC: 33.8 g/dL (ref 30.0–36.0)
MCV: 93.4 fL (ref 80.0–100.0)
Platelets: 167 10*3/uL (ref 150–400)
RBC: 5.79 MIL/uL (ref 4.22–5.81)
RDW: 13.8 % (ref 11.5–15.5)
WBC: 7 10*3/uL (ref 4.0–10.5)
nRBC: 0 % (ref 0.0–0.2)

## 2020-09-27 LAB — GLUCOSE, CAPILLARY
Glucose-Capillary: 193 mg/dL — ABNORMAL HIGH (ref 70–99)
Glucose-Capillary: 212 mg/dL — ABNORMAL HIGH (ref 70–99)
Glucose-Capillary: 197 mg/dL — ABNORMAL HIGH (ref 70–99)
Glucose-Capillary: 210 mg/dL — ABNORMAL HIGH (ref 70–99)
Glucose-Capillary: 206 mg/dL — ABNORMAL HIGH (ref 70–99)
Glucose-Capillary: 207 mg/dL — ABNORMAL HIGH (ref 70–99)

## 2020-09-27 LAB — APTT
aPTT: 54 seconds — ABNORMAL HIGH (ref 24–36)
aPTT: 70 seconds — ABNORMAL HIGH (ref 24–36)
aPTT: 63 seconds — ABNORMAL HIGH (ref 24–36)

## 2020-09-27 LAB — LIPID PANEL
Cholesterol: 215 mg/dL — ABNORMAL HIGH (ref 0–200)
HDL: 29 mg/dL — ABNORMAL LOW (ref >40)
LDL Cholesterol: UNDETERMINED mg/dL (ref 0–99)
Total CHOL/HDL Ratio: 7.4 RATIO
Triglycerides: 488 mg/dL — ABNORMAL HIGH (ref <150)
VLDL: UNDETERMINED mg/dL (ref 0–40)

## 2020-09-27 LAB — TROPONIN I (HIGH SENSITIVITY)
Troponin I (High Sensitivity): 22 ng/L — ABNORMAL HIGH (ref <18)
Troponin I (High Sensitivity): 23 ng/L — ABNORMAL HIGH (ref <18)

## 2020-09-27 LAB — LDL CHOLESTEROL, DIRECT: Direct LDL: 122.1 mg/dL — ABNORMAL HIGH (ref 0–99)

## 2020-09-27 LAB — PROTIME-INR
INR: 1.1 (ref 0.8–1.2)
Prothrombin Time: 14.1 seconds (ref 11.4–15.2)

## 2020-09-27 MED ORDER — OXYCODONE-ACETAMINOPHEN 5-325 MG PO TABS
1.0000 | ORAL_TABLET | ORAL | Status: DC | PRN
  Administered 2020-09-27 – 2020-09-28 (×2): 1 via ORAL
  Filled 2020-09-27 (×2): qty 1

## 2020-09-27 MED ORDER — LIVING WELL WITH DIABETES BOOK
Freq: Once | Status: AC
  Filled 2020-09-27: qty 1

## 2020-09-27 MED ORDER — OXYCODONE HCL 5 MG PO TABS: 5.0000 mg | ORAL_TABLET | ORAL | Status: DC | PRN

## 2020-09-27 MED ORDER — HEPARIN BOLUS VIA INFUSION
1300.0000 [IU] | Freq: Once | INTRAVENOUS | Status: AC
  Administered 2020-09-27: 1300 [IU] via INTRAVENOUS
  Filled 2020-09-27: qty 1300

## 2020-09-27 MED ORDER — DAPAGLIFLOZIN PROPANEDIOL 5 MG PO TABS
10.0000 mg | ORAL_TABLET | Freq: Every day | ORAL | Status: DC
  Administered 2020-09-27 – 2020-09-28 (×2): 10 mg via ORAL
  Filled 2020-09-27 (×3): qty 2

## 2020-09-27 MED ORDER — ONDANSETRON HCL 4 MG/2ML IJ SOLN: 4.0000 mg | Freq: Four times a day (QID) | INTRAMUSCULAR | Status: DC | PRN

## 2020-09-27 MED ORDER — ASPIRIN 300 MG RE SUPP: 300.0000 mg | RECTAL | Status: DC

## 2020-09-27 MED ORDER — TRAZODONE HCL 50 MG PO TABS
150.0000 mg | ORAL_TABLET | Freq: Every day | ORAL | Status: DC
  Administered 2020-09-27 (×2): 150 mg via ORAL
  Filled 2020-09-27 (×2): qty 1

## 2020-09-27 MED ORDER — RANOLAZINE ER 500 MG PO TB12
1000.0000 mg | ORAL_TABLET | Freq: Two times a day (BID) | ORAL | Status: DC
  Administered 2020-09-27 – 2020-09-28 (×2): 1000 mg via ORAL
  Filled 2020-09-27 (×4): qty 2

## 2020-09-27 MED ORDER — APIXABAN 5 MG PO TABS: 5.0000 mg | ORAL_TABLET | Freq: Two times a day (BID) | ORAL | Status: DC

## 2020-09-27 MED ORDER — INSULIN GLARGINE 100 UNIT/ML ~~LOC~~ SOLN
10.0000 [IU] | Freq: Two times a day (BID) | SUBCUTANEOUS | Status: DC
  Administered 2020-09-27 – 2020-09-28 (×3): 10 [IU] via SUBCUTANEOUS
  Filled 2020-09-27 (×5): qty 0.1

## 2020-09-27 MED ORDER — ALLOPURINOL 300 MG PO TABS
300.0000 mg | ORAL_TABLET | Freq: Two times a day (BID) | ORAL | Status: DC
  Administered 2020-09-27 – 2020-09-28 (×3): 300 mg via ORAL
  Filled 2020-09-27 (×4): qty 1

## 2020-09-27 MED ORDER — VENLAFAXINE HCL ER 75 MG PO CP24
75.0000 mg | ORAL_CAPSULE | Freq: Every day | ORAL | Status: DC
  Administered 2020-09-28: 75 mg via ORAL
  Filled 2020-09-27: qty 1

## 2020-09-27 MED ORDER — OXYCODONE-ACETAMINOPHEN 10-325 MG PO TABS: 1.0000 | ORAL_TABLET | ORAL | Status: DC | PRN

## 2020-09-27 MED ORDER — ATORVASTATIN CALCIUM 80 MG PO TABS
80.0000 mg | ORAL_TABLET | Freq: Every day | ORAL | Status: DC
  Administered 2020-09-27: 80 mg via ORAL
  Filled 2020-09-27: qty 1

## 2020-09-27 MED ORDER — DOCUSATE SODIUM 100 MG PO CAPS
100.0000 mg | ORAL_CAPSULE | Freq: Every day | ORAL | Status: DC
  Administered 2020-09-28: 100 mg via ORAL
  Filled 2020-09-27: qty 1

## 2020-09-27 MED ORDER — PANTOPRAZOLE SODIUM 40 MG PO TBEC
40.0000 mg | DELAYED_RELEASE_TABLET | Freq: Every day | ORAL | Status: DC
  Administered 2020-09-27 – 2020-09-28 (×2): 40 mg via ORAL
  Filled 2020-09-27 (×2): qty 1

## 2020-09-27 MED ORDER — LISINOPRIL 10 MG PO TABS
10.0000 mg | ORAL_TABLET | Freq: Every day | ORAL | Status: DC
  Administered 2020-09-28: 10 mg via ORAL
  Filled 2020-09-27: qty 1

## 2020-09-27 MED ORDER — OMEGA-3-ACID ETHYL ESTERS 1 G PO CAPS
4.0000 | ORAL_CAPSULE | Freq: Every day | ORAL | Status: DC
  Administered 2020-09-27 – 2020-09-28 (×2): 4 g via ORAL
  Filled 2020-09-27 (×2): qty 4

## 2020-09-27 MED ORDER — ISOSORBIDE MONONITRATE ER 60 MG PO TB24
120.0000 mg | ORAL_TABLET | Freq: Every day | ORAL | Status: DC
  Administered 2020-09-28: 120 mg via ORAL
  Filled 2020-09-27: qty 2

## 2020-09-27 MED ORDER — SOTALOL HCL 80 MG PO TABS
80.0000 mg | ORAL_TABLET | Freq: Two times a day (BID) | ORAL | Status: DC
  Administered 2020-09-28: 80 mg via ORAL
  Filled 2020-09-27 (×5): qty 1

## 2020-09-27 MED ORDER — MORPHINE SULFATE (PF) 2 MG/ML IV SOLN
2.0000 mg | INTRAVENOUS | Status: DC | PRN
  Administered 2020-09-27 – 2020-09-28 (×4): 2 mg via INTRAVENOUS
  Filled 2020-09-27 (×4): qty 1

## 2020-09-27 MED ORDER — ASPIRIN 81 MG PO CHEW
81.0000 mg | CHEWABLE_TABLET | Freq: Every day | ORAL | Status: DC
  Administered 2020-09-27 – 2020-09-28 (×2): 81 mg via ORAL
  Filled 2020-09-27 (×2): qty 1

## 2020-09-27 MED ORDER — TORSEMIDE 20 MG PO TABS
50.0000 mg | ORAL_TABLET | Freq: Every day | ORAL | Status: DC
  Administered 2020-09-28: 50 mg via ORAL
  Filled 2020-09-27: qty 3

## 2020-09-27 MED ORDER — ACETAMINOPHEN 325 MG PO TABS: 650.0000 mg | ORAL_TABLET | ORAL | Status: DC | PRN

## 2020-09-27 MED ORDER — AMLODIPINE BESYLATE 5 MG PO TABS
2.5000 mg | ORAL_TABLET | Freq: Every day | ORAL | Status: DC
  Administered 2020-09-28: 2.5 mg via ORAL
  Filled 2020-09-27: qty 1

## 2020-09-27 MED ORDER — ALBUTEROL SULFATE HFA 108 (90 BASE) MCG/ACT IN AERS
2.0000 | INHALATION_SPRAY | Freq: Four times a day (QID) | RESPIRATORY_TRACT | Status: DC | PRN
  Filled 2020-09-27: qty 6.7

## 2020-09-27 MED ORDER — TIOTROPIUM BROMIDE MONOHYDRATE 18 MCG IN CAPS
18.0000 ug | ORAL_CAPSULE | Freq: Every day | RESPIRATORY_TRACT | Status: DC
  Administered 2020-09-27 – 2020-09-28 (×2): 18 ug via RESPIRATORY_TRACT
  Filled 2020-09-27: qty 5

## 2020-09-27 MED ORDER — NITROGLYCERIN 0.4 MG SL SUBL
0.4000 mg | SUBLINGUAL_TABLET | SUBLINGUAL | Status: DC | PRN
  Filled 2020-09-27: qty 1

## 2020-09-27 MED ORDER — LINACLOTIDE 72 MCG PO CAPS
72.0000 ug | ORAL_CAPSULE | Freq: Every day | ORAL | Status: DC
  Administered 2020-09-28: 72 ug via ORAL
  Filled 2020-09-27 (×3): qty 1

## 2020-09-27 MED ORDER — LORATADINE 10 MG PO TABS
10.0000 mg | ORAL_TABLET | Freq: Every day | ORAL | Status: DC
  Administered 2020-09-27 – 2020-09-28 (×2): 10 mg via ORAL
  Filled 2020-09-27 (×2): qty 1

## 2020-09-27 MED ORDER — ASPIRIN 81 MG PO CHEW
324.0000 mg | CHEWABLE_TABLET | ORAL | Status: DC
  Filled 2020-09-27: qty 4

## 2020-09-27 MED ORDER — FLUTICASONE FUROATE-VILANTEROL 100-25 MCG/INH IN AEPB
1.0000 | INHALATION_SPRAY | Freq: Every day | RESPIRATORY_TRACT | Status: DC
  Administered 2020-09-27 – 2020-09-28 (×2): 1 via RESPIRATORY_TRACT
  Filled 2020-09-27: qty 28

## 2020-09-27 MED ORDER — ALPRAZOLAM 0.5 MG PO TABS
1.0000 mg | ORAL_TABLET | Freq: Three times a day (TID) | ORAL | Status: DC
  Administered 2020-09-27 – 2020-09-28 (×4): 1 mg via ORAL
  Filled 2020-09-27 (×4): qty 2

## 2020-09-27 NOTE — Progress Notes (Addendum)
Inpatient Diabetes Program Recommendations  AACE/ADA: New Consensus Statement on Inpatient Glycemic Control (2015)  Target Ranges:  Prepandial:   less than 140 mg/dL      Peak postprandial:   less than 180 mg/dL (1-2 hours)      Critically ill patients:  140 - 180 mg/dL   Lab Results  Component Value Date   GLUCAP 210 (H) 09/27/2020   HGBA1C 9.6 (H) 09/24/2020    Review of Glycemic Control Results for Lawrence [name], Lawrence [name] (MRN [id number]) as of 09/27/2020 12:26  Ref. Range 09/26/2020 20:15 09/27/2020 01:17 09/27/2020 04:20 09/27/2020 08:32 09/27/2020 11:41  Glucose-Capillary Latest Ref Range: 70 - 99 mg/dL 200 (H) 193 (H) 212 (H) 197 (H) 210 (H)   Diabetes history: DM 2 Outpatient Diabetes medications:  Farxiga 10 mg daily, Metformin 500 mg daily (just added) Current orders for Inpatient glycemic control:  Novolog moderate q 4 hours Farxiga 10 mg daily  Lantus 10 units bid (just started) Inpatient Diabetes Program Recommendations:   Patient is currently NPO. Per chart review, patient saw PCP on 09/24/20 and A1C was increased to 9.6%. MD added Metformin 500 mg daily.  Will order patient DM booklet and also attempt to call him regarding A1C.   Thanks,  Adah Perl, RN, BC-ADM Inpatient Diabetes Coordinator Pager [phone] (8a-5p)  Addendum:  Spoke with patient by phone regarding elevated A1c of 9.6%.  He states that MD just started him on Metformin.  Also discussed goal A1C and goal blood sugars.  He states that fasting CBG's approximately 150-160 mg/dL.  Discussed that goal blood sugars are 80-130 mg/dL first thing in the morning and < then 180 mg/dL after eating.  He says that he drinks lots of sweet tea and regular soda- Recommended that he avoid sugar in beverages.  Patient verbalized understanding.  Recommended f/u with PCP as well.

## 2020-09-27 NOTE — Plan of Care (Signed)
  Problem: Education: Goal: Understanding of CV disease, CV risk reduction, and recovery process will improve Outcome: Progressing   Problem: Activity: Goal: Ability to return to baseline activity level will improve Outcome: Progressing   Problem: Cardiovascular: Goal: Ability to achieve and maintain adequate cardiovascular perfusion will improve Outcome: Progressing   Problem: Cardiovascular: Goal: Vascular access site(s) Level 0-1 will be maintained Outcome: Progressing   Problem: Clinical Measurements: Goal: Ability to maintain clinical measurements within normal limits will improve Outcome: Progressing   Problem: Clinical Measurements: Goal: Will remain free from infection Outcome: Progressing   Problem: Clinical Measurements: Goal: Diagnostic test results will improve Outcome: Progressing   Problem: Clinical Measurements: Goal: Respiratory complications will improve Outcome: Progressing   Problem: Clinical Measurements: Goal: Cardiovascular complication will be avoided Outcome: Progressing

## 2020-09-27 NOTE — Consult Note (Addendum)
ANTICOAGULATION CONSULT NOTE - Consult  Pharmacy Consult for Heparin Indication: chest pain/ACS  Allergies  Allergen Reactions  . Demerol  [Meperidine Hcl]   . Demerol [Meperidine] Hives  . Jardiance [Empagliflozin] Other (See Comments)    Perineal pain  . Prednisone Other (See Comments) and Hypertension    Pt states that this medication puts him in A-fib   . Sulfa Antibiotics Hives  . Albuterol Sulfate [Albuterol] Palpitations and Other (See Comments)    Pt currently uses this medication.    . Morphine Sulfate Nausea And Vomiting, Rash and Other (See Comments)    Pt states that he is only allergic to the tablet form of this medication.      Patient Measurements: Height: 5' 7"  (170.2 cm) Weight: 98.2 kg (216 lb 6.4 oz) IBW/kg (Calculated) : 66.1 Heparin Dosing Weight: 87.4  Vital Signs: Temp: 97.9 F (36.6 C) (11/12 0728) Temp Source: Oral (11/12 0728) BP: 109/67 (11/12 1100) Pulse Rate: 57 (11/12 1100)  Labs: Recent Labs    09/26/20 1800 09/26/20 1858 09/27/20 0306 09/27/20 1032  HGB 19.6*  --  18.3*  --   HCT 58.3*  --  54.1*  --   PLT 205  --  167  --   APTT  --  34 54* 70*  LABPROT  --   --  14.1  --   INR  --   --  1.1  --   HEPARINUNFRC  --  1.62*  --   --   CREATININE 1.24  --  1.02  --   TROPONINIHS 31* 33*  --   --     Estimated Creatinine Clearance: 79.5 mL/min (by C-G formula based on SCr of 1.02 mg/dL).   Medical History: Past Medical History:  Diagnosis Date  . A-fib ([place])   . Anemia   . Anginal pain ([place])   . Anxiety   . Arthritis   . Asthma   . CAD (coronary artery disease)    a. 2002 CABGx2 (LIMA->LAD, VG->VG->OM1);  b. 09/2012 DES->OM;  c. 03/2015 PTCA of LAD [facility]) in setting of atretic LIMA; d. 05/2015 Cath [facility]): nonobs dzs; e. 06/2015 Cath ([facility]): LM nl, LAD 45p/d ISR, 50d, D1/2 small, LCX 50p/d ISR, OM1 70ost, 30 ISR, VG->OM1 50ost, 43m LIMA->LAD 99p/d - atretic, RCA dom, nl; f.cath 10/16: 40-50%(FFR 0.90) pLAD, 75% (FFR 0.77) mLAD  s/p PCI/DES, oRCA 40% (FFR0.95)  . [place]    SKIN CANCER ON BACK  . Celiac disease   . Chronic diastolic CHF (congestive heart failure) ([place]    a. 06/2009 Echo: EF 60-65%, Gr 1 DD, triv AI, mildly dil LA, nl RV.  .Marland KitchenCOPD (chronic obstructive pulmonary disease) ([place]    a. Chronic bronchitis and emphysema.  . DDD (degenerative disc disease), lumbar   . Diverticulosis   . Dysrhythmia   . Essential hypertension   . GERD (gastroesophageal reflux disease)   . History of hiatal hernia   . History of kidney stones    H/O  . History of tobacco abuse    a. Quit 2014.  .[place]ocardial infarction ([place] 2002   4 STENTS  . Pancreatitis   . PSVT (paroxysmal supraventricular tachycardia) ([place]    a. 10/2012 Noted on Zio Patch.  . Sleep apnea    LOST CORD TO CPAP -ONLY 02 @ BEDTIME  . Tubular adenoma of colon   . Type II diabetes mellitus (HCC)    Date APTT Dose Rate 11/11 54 1150 (inc'd to 1350 units/hr)  11/11 70 1350 units/hr    Medications:  Pt on Eliquis 5 mg BID prior to admission   Assessment: 64 year old male presenting with chest pain. PMH includes CAD s/p CABG and stent, afib, COPD, DM, HTN, HLD, arthritis, asthma, anxiety, anemia, and heart failure. Pt takes Eliquis prior to admission - reports last dose was this morning. Hgb 19.6, plt WNL.  1st therapeutic aPTT at rate of 1350units/hr, will recheck for 2 consecutive therapeutic then daily.  Goal of Therapy:  Heparin level 0.3-0.7 units/ml aPTT 66-102 seconds (baseline 34) Monitor platelets by anticoagulation protocol: Yes   Plan:  Maintain heparin infusion to 1350 units/hr. Recheck aPTT level in 6 hours (1700) and daily while on heparin. Continue to monitor H&H and platelets with AM labs  Lorna Dibble, PharmD 09/27/2020 11:38 AM

## 2020-09-27 NOTE — Consult Note (Signed)
ANTICOAGULATION CONSULT NOTE - Consult  Pharmacy Consult for Heparin Indication: chest pain/ACS  Allergies  Allergen Reactions  . Demerol  [Meperidine Hcl]   . Demerol [Meperidine] Hives  . Jardiance [Empagliflozin] Other (See Comments)    Perineal pain  . Prednisone Other (See Comments) and Hypertension    Pt states that this medication puts him in A-fib   . Sulfa Antibiotics Hives  . Albuterol Sulfate [Albuterol] Palpitations and Other (See Comments)    Pt currently uses this medication.    . Morphine Sulfate Nausea And Vomiting, Rash and Other (See Comments)    Pt states that he is only allergic to the tablet form of this medication.      Patient Measurements: Height: 5' 7"  (170.2 cm) Weight: 98.2 kg (216 lb 6.4 oz) IBW/kg (Calculated) : 66.1 Heparin Dosing Weight: 87.4  Vital Signs: Temp: 98.1 F (36.7 C) (11/12 1507) Temp Source: Oral (11/12 1507) BP: 117/74 (11/12 1507) Pulse Rate: 60 (11/12 1507)  Labs: Recent Labs    09/26/20 1800 09/26/20 1800 09/26/20 1858 09/26/20 1858 09/27/20 0306 09/27/20 1032 09/27/20 1242 09/27/20 1501 09/27/20 1707  HGB 19.6*  --   --   --  18.3*  --   --   --   --   HCT 58.3*  --   --   --  54.1*  --   --   --   --   PLT 205  --   --   --  167  --   --   --   --   APTT  --   --  34   < > 54* 70*  --   --  63*  LABPROT  --   --   --   --  14.1  --   --   --   --   INR  --   --   --   --  1.1  --   --   --   --   HEPARINUNFRC  --   --  1.62*  --   --   --   --   --   --   CREATININE 1.24  --   --   --  1.02  --   --   --   --   TROPONINIHS 31*   < > 33*  --   --   --  22* 23*  --    < > = values in this interval not displayed.    Estimated Creatinine Clearance: 79.5 mL/min (by C-G formula based on SCr of 1.02 mg/dL).   Medical History: Past Medical History:  Diagnosis Date  . A-fib ([place])   . Anemia   . Anginal pain ([place])   . Anxiety   . Arthritis   . Asthma   . CAD (coronary artery disease)    a. 2002 CABGx2  (LIMA->LAD, VG->VG->OM1);  b. 09/2012 DES->OM;  c. 03/2015 PTCA of LAD [facility]) in setting of atretic LIMA; d. 05/2015 Cath [facility]): nonobs dzs; e. 06/2015 Cath ([facility]): LM nl, LAD 45p/d ISR, 50d, D1/2 small, LCX 50p/d ISR, OM1 70ost, 30 ISR, VG->OM1 50ost, 28m LIMA->LAD 99p/d - atretic, RCA dom, nl; f.cath 10/16: 40-50%(FFR 0.90) pLAD, 75% (FFR 0.77) mLAD s/p PCI/DES, oRCA 40% (FFR0.95)  . Cancer ([place]    SKIN CANCER ON BACK  . Celiac disease   . Chronic diastolic CHF (congestive heart failure) ([place]    a. 06/2009 Echo: EF 60-65%, Gr 1 DD, tLurlean Nanny  AI, mildly dil LA, nl RV.  Marland Kitchen COPD (chronic obstructive pulmonary disease) ()    a. Chronic bronchitis and emphysema.  . DDD (degenerative disc disease), lumbar   . Diverticulosis   . Dysrhythmia   . Essential hypertension   . GERD (gastroesophageal reflux disease)   . History of hiatal hernia   . History of kidney stones    H/O  . History of tobacco abuse    a. Quit 2014.  Marland Kitchen Myocardial infarction ([place]) 2002   4 STENTS  . Pancreatitis   . PSVT (paroxysmal supraventricular tachycardia) ([place])    a. 10/2012 Noted on Zio Patch.  . Sleep apnea    LOST CORD TO CPAP -ONLY 02 @ BEDTIME  . Tubular adenoma of colon   . Type II diabetes mellitus (HCC)    Date APTT Dose Rate 11/11 54 1150 (inc'd to 1350 units/hr) 11/11 70 1350 units/hr 11/12 63 (see note below)    Medications:  Pt on Eliquis 5 mg BID prior to admission   Assessment: 64 year old male presenting with chest pain. PMH includes CAD s/p CABG and stent, afib, COPD, DM, HTN, HLD, arthritis, asthma, anxiety, anemia, and heart failure. Pt takes Eliquis prior to admission - reports last dose was this morning. Hgb 19.6, plt WNL.   -1st therapeutic aPTT at rate of 1350units/hr, will recheck for 2 consecutive therapeutic then daily.  - aPTT 63. Slightly subtherapeutic. Confirmed with nurse no heparin interruptions or issues with line.   Goal of Therapy:  Heparin level 0.3-0.7 units/ml aPTT  66-102 seconds (baseline 34) Monitor platelets by anticoagulation protocol: Yes   Plan:   Heparin plan: 1300 x1 dose and increase heparin infusion to 1500 units/hr. Nursing made aware of changes.  Recheck aPTT level in 6 hours (0030) and daily while on heparin. Will check heparin level with AM labs.   Continue to monitor H&H and platelets with AM labs  Rowland Lathe, PharmD 09/27/2020 6:05 PM

## 2020-09-27 NOTE — Progress Notes (Signed)
PROGRESS NOTE  Lawrence [name] XVQ:[id number] DOB: [birth year]   PCP: Birdie Sons, MD  Patient is from: Home.  DOA: 09/26/2020 LOS: 1  Chief complaints: Chest pain  Brief Narrative / Interim history: 64 year old male with PMH of CAD/CABG and stents, paroxysmal A. 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  In ED, hemodynamically stable.  92% on RA.  Hgb 19.6.  HCT 58. Na 132.  Twelve-lead EKG sinus rhythm with PACs and new TWI in lateral leads.  High-sensitivity troponin 31.  Received sublingual nitro and started on heparin infusion for presumed NSTEMI after discussion between EDP and cardiologist, Dr. Rockey Situ  Subjective: Seen and examined earlier this morning.  No major events overnight of this morning.  No further chest pain since midnight.  Denies dyspnea, palpitation, dizziness, GI or UTI symptoms.  Denies calf swelling or pain.  Objective: Vitals:   09/26/20 2251 09/26/20 2315 09/27/20 0429 09/27/20 0728  BP: 115/78 139/81 (!) 100/58 109/74  Pulse: (!) 47 (!) 58 (!) 57 60  Resp: 17 17 18 14   Temp:  97.9 F (36.6 C) 97.8 F (36.6 C) 97.9 F (36.6 C)  TempSrc:  Oral Oral Oral  SpO2: 98% 95% 92% 98%  Weight:   98.2 kg   Height:        Intake/Output Summary (Last 24 hours) at 09/27/2020 1053 Last data filed at 09/27/2020 0900 Gross per 24 hour  Intake --  Output 480 ml  Net -480 ml   Filed Weights   09/26/20 1757 09/27/20 0429  Weight: 98.4 kg 98.2 kg    Examination:  GENERAL: No apparent distress.  Nontoxic. HEENT: MMM.  Vision and hearing grossly intact.  NECK: Supple.  No apparent JVD.  RESP: On 2 L.  No IWOB.  Fair aeration bilaterally. CVS:  RRR. Heart sounds normal.  Radial and DP pulses symmetric. ABD/GI/GU: BS+.  Abd soft, NTND.  MSK/EXT:  Moves extremities. No apparent deformity. No edema.  SKIN: no apparent skin lesion or wound NEURO: Awake, alert and oriented appropriately.  No apparent focal neuro deficit. PSYCH: Calm. Normal affect.   Procedures:  None  Microbiology summarized: Limited RVP PCR nonreactive.  Assessment & Plan: Non-STEMI: his chest pain is somewhat atypical but new TWI on EKG. Mild troponin leak.  He is currently chest pain-free.  Significant risk factors.  LHC in 02/2020 with two-vessel CAD but patent stents and grafts.  PE, infectious etiology and dissection less likely.  Patient was started on IV heparin per discussion between EDP and Dr. Rockey Situ.  -Continue IV heparin. -Continue aspirin -Follow cardiology recs-notified Dr. Clayborn Bigness (primary cardiologist) and Dr. Rockey Situ  -N.p.o. except sips with meds.  History of CAD/CABG x2 in 2002 s/p multiple stents with recent [place] in 02/2020 reported as two-vessel CAD with patent stents in proximal mid and distal LAD, proximal left circumflex, 70% stenosis OM1,atretic LIMA to LAD, patent SVG to OM1 with patent stent ostium SVG.  He is on lisinopril, Ranexa, statin and aspirin -Continue home medications. -Further care per cardiology.  Chronic systolic CHF: TTE in 0/9326 with EF of 45 to 50%, indeterminate DD.   He is on Imdur, lisinopril, Farxiga and torsemide at home.He appears compensated.  -Continue home medications -Monitor fluid status -May need additional GDMT but defer this to cardiology.  Paroxysmal A. fib: Rate controlled.  On sotalol and Eliquis at home. -Continue home sotalol. -On IV heparin for NSTEMI.  Essential hypertension: Normotensive. -Continue cardiac meds as above  Uncontrolled NIDDM-2 with hyperglycemia: A1c 9.6%.  On Farxiga and Metformin at home. Recent Labs  Lab 09/26/20 2015 09/27/20 0117 09/27/20 0420 09/27/20 0832 09/27/20 1141  GLUCAP 200* 193* 212* 197* 210*  -Continue SSI-moderate every 4  hours -Add Lantus 10 units twice daily -Continue home Fraxiga -Continue home statin.  Chronic COPD/OSA/chronic RF on 2 L at baseline. -Continue home LAMA/LABA/ICS -Continue home oxygen  Anxiety/depression/insomnia: Stable. -Continue home Effexor, trazodone and Xanax.  Chronic pain syndrome: Stable. -Continue home medications  History of gout without acute flareup:  -Continue home allopurinol   Body mass index is 33.89 kg/m.         DVT prophylaxis:  On full dose heparin for non-STEMI.  Code Status: Full code Family Communication: Patient and/or RN. Available if any question.  Status is: Inpatient  Remains inpatient appropriate because:Ongoing diagnostic testing needed not appropriate for outpatient work up, IV treatments appropriate due to intensity of illness or inability to take PO and Inpatient level of care appropriate due to severity of illness   Dispo: The patient is from: Home              Anticipated d/c is to: Home              Anticipated d/c date is: 2 days              Patient currently is not medically stable to d/c.       Consultants:  Cardiology   Sch Meds:  Scheduled Meds: . allopurinol  300 mg Oral BID  . ALPRAZolam  1 mg Oral TID  . amLODipine  2.5 mg Oral Daily  . aspirin  324 mg Oral NOW   Or  . aspirin  300 mg Rectal NOW  . aspirin  81 mg Oral Daily  . atorvastatin  80 mg Oral q1800  . docusate sodium  100 mg Oral Daily  . fluticasone furoate-vilanterol  1 puff Inhalation Daily  . insulin aspart  0-15 Units Subcutaneous Q4H  . isosorbide mononitrate  120 mg Oral Daily  . linaclotide  72 mcg Oral QAC breakfast  . lisinopril  10 mg Oral Daily  . loratadine  10 mg Oral Daily  . omega-3 acid ethyl esters  4 capsule Oral Daily  . pantoprazole  40 mg Oral Daily  . ranolazine  1,000 mg Oral BID  . sotalol  80 mg Oral BID  . tiotropium  18 mcg Inhalation Daily  . torsemide  50 mg Oral Daily  . traZODone  150 mg Oral QHS  .  venlafaxine XR  75 mg Oral Q breakfast   Continuous Infusions: . heparin 1,350 Units/hr (09/27/20 0428)  . nitroGLYCERIN Stopped (09/26/20 2252)   PRN Meds:.acetaminophen, albuterol, morphine injection, nitroGLYCERIN, ondansetron (ZOFRAN) IV, oxyCODONE-acetaminophen **AND** oxyCODONE  Antimicrobials: Anti-infectives (From admission, onward)   None       I have personally reviewed the following labs and images: CBC: Recent Labs  Lab 09/24/20 1105 09/26/20 1800 09/27/20 0306  WBC 5.9 9.4 7.0  HGB 20.5* 19.6* 18.3*  HCT 62.2* 58.3* 54.1*  MCV 93 93.3 93.4  PLT 164 205 167   BMP &GFR Recent Labs  Lab 09/24/20 1105 09/26/20 1800 09/27/20 0306  NA 139 132* 136  K 5.7* 3.6 3.8  CL 96 93* 98  CO2 28  30 30  GLUCOSE 256* 211* 213*  BUN 8 15 18   CREATININE 0.94 1.24 1.02  CALCIUM 10.8* 9.2 9.2   Estimated Creatinine Clearance: 79.5 mL/min (by C-G formula based on SCr of 1.02 mg/dL). Liver & Pancreas: Recent Labs  Lab 09/24/20 1105  AST 24  ALT 22  ALKPHOS 115  BILITOT 0.5  PROT 7.4  ALBUMIN 4.7   No results for input(s): LIPASE, AMYLASE in the last 168 hours. No results for input(s): AMMONIA in the last 168 hours. Diabetic: Recent Labs    09/24/20 1105  HGBA1C 9.6*   Recent Labs  Lab 09/26/20 1936 09/26/20 2015 09/27/20 0117 09/27/20 0420 09/27/20 0832  GLUCAP 234* 200* 193* 212* 197*   Cardiac Enzymes: No results for input(s): CKTOTAL, CKMB, CKMBINDEX, TROPONINI in the last 168 hours. No results for input(s): PROBNP in the last 8760 hours. Coagulation Profile: Recent Labs  Lab 09/27/20 0306  INR 1.1   Thyroid Function Tests: Recent Labs    09/24/20 1105  TSH 1.430   Lipid Profile: Recent Labs    09/27/20 0306  CHOL 215*  HDL 29*  LDLCALC UNABLE TO CALCULATE IF TRIGLYCERIDE OVER 400 mg/dL  TRIG 488*  CHOLHDL 7.4   Anemia Panel: No results for input(s): VITAMINB12, FOLATE, FERRITIN, TIBC, IRON, RETICCTPCT in the last 72  hours. Urine analysis:    Component Value Date/Time   COLORURINE AMBER (A) 10/03/2019 2002   APPEARANCEUR CLEAR (A) 10/03/2019 2002   APPEARANCEUR Clear 05/25/2019 1554   LABSPEC 1.037 (H) 10/03/2019 2002   LABSPEC 1.012 03/07/2015 2143   PHURINE 5.0 10/03/2019 2002   GLUCOSEU >=500 (A) 10/03/2019 2002   GLUCOSEU Negative 03/07/2015 2143   HGBUR NEGATIVE 10/03/2019 2002   [place] NEGATIVE 10/03/2019 2002   BILIRUBINUR Negative 05/25/2019 1554   BILIRUBINUR Negative 03/07/2015 2143   [place] NEGATIVE 10/03/2019 2002   PROTEINUR 100 (A) 10/03/2019 2002   UROBILINOGEN 0.2 03/26/2017 0907   NITRITE NEGATIVE 10/03/2019 2002   LEUKOCYTESUR NEGATIVE 10/03/2019 2002   LEUKOCYTESUR Negative 03/07/2015 2143   Sepsis Labs: Invalid input(s): PROCALCITONIN, [place]  Microbiology: Recent Results (from the past 240 hour(s))  Respiratory Panel by RT PCR (Flu A&B, Covid) - Nasopharyngeal Swab     Status: None   Collection Time: 09/26/20  6:57 PM   Specimen: Nasopharyngeal Swab  Result Value Ref Range Status   SARS Coronavirus 2 by RT PCR NEGATIVE NEGATIVE Final    Comment: (NOTE) SARS-CoV-2 target nucleic acids are NOT DETECTED.  The SARS-CoV-2 RNA is generally detectable in upper respiratoy specimens during the acute phase of infection. The lowest concentration of SARS-CoV-2 viral copies this assay can detect is 131 copies/mL. A negative result does not preclude SARS-Cov-2 infection and should not be used as the sole basis for treatment or other patient management decisions. A negative result may occur with  improper specimen collection/handling, submission of specimen other than nasopharyngeal swab, presence of viral mutation(s) within the areas targeted by this assay, and inadequate number of viral copies (<131 copies/mL). A negative result must be combined with clinical observations, patient history, and epidemiological information. The expected result is Negative.  Fact  Sheet for Patients:  PinkCheek.be  Fact Sheet for Healthcare Providers:  GravelBags.it  This test is no t yet approved or cleared by the [place] FDA and  has been authorized for detection and/or diagnosis of SARS-CoV-2 by FDA under an Emergency Use Authorization (EUA). This EUA will remain  in effect (meaning this test can be used) for  the duration of the COVID-19 declaration under Section 564(b)(1) of the Act, 21 U.S.C. section 360bbb-3(b)(1), unless the authorization is terminated or revoked sooner.     Influenza A by PCR NEGATIVE NEGATIVE Final   Influenza B by PCR NEGATIVE NEGATIVE Final    Comment: (NOTE) The Xpert Xpress SARS-CoV-2/FLU/RSV assay is intended as an aid in  the diagnosis of influenza from Nasopharyngeal swab specimens and  should not be used as a sole basis for treatment. Nasal washings and  aspirates are unacceptable for Xpert Xpress SARS-CoV-2/FLU/RSV  testing.  Fact Sheet for Patients: PinkCheek.be  Fact Sheet for Healthcare Providers: GravelBags.it  This test is not yet approved or cleared by the [place] FDA and  has been authorized for detection and/or diagnosis of SARS-CoV-2 by  FDA under an Emergency Use Authorization (EUA). This EUA will remain  in effect (meaning this test can be used) for the duration of the  Covid-19 declaration under Section 564(b)(1) of the Act, 21  U.S.C. section 360bbb-3(b)(1), unless the authorization is  terminated or revoked. Performed at [facility], [address] [place], [place] [address]     Radiology Studies: DG Chest 2 View  Result Date: 09/26/2020 CLINICAL DATA:  Chest pain EXAM: CHEST - 2 VIEW COMPARISON:  08/10/2020 FINDINGS: Post sternotomy changes. No focal opacity or pleural effusion. Stable cardiomediastinal silhouette with aortic atherosclerosis. No pneumothorax.  IMPRESSION: No active cardiopulmonary disease. Electronically Signed   By: Donavan Foil M.D.   On: 09/26/2020 18:54     Rodneisha Bonnet T. [place]  If 7PM-7AM, please contact night-coverage [email or web address] 09/27/2020, 10:53 AM

## 2020-09-27 NOTE — Plan of Care (Signed)
  Problem: Education: Goal: Understanding of CV disease, CV risk reduction, and recovery process will improve Outcome: Progressing   Problem: Cardiovascular: Goal: Ability to achieve and maintain adequate cardiovascular perfusion will improve Outcome: Progressing   Problem: Activity: Goal: Ability to return to baseline activity level will improve Outcome: Progressing   Problem: Clinical Measurements: Goal: Ability to maintain clinical measurements within normal limits will improve Outcome: Progressing   Problem: Clinical Measurements: Goal: Will remain free from infection Outcome: Progressing   Problem: Clinical Measurements: Goal: Diagnostic test results will improve Outcome: Progressing   Problem: Safety: Goal: Ability to remain free from injury will improve Outcome: Progressing   Problem: Pain Managment: Goal: General experience of comfort will improve Outcome: Progressing

## 2020-09-27 NOTE — Consult Note (Signed)
CARDIOLOGY CONSULT NOTE               Patient ID: Lawrence [name] MRN: [id number] DOB/AGE: 66-Jul-1957 66 y.o.  Admit date: 09/26/2020 Referring Physician Dr Wendee Beavers Primary Physician Dr Despina Arias Primary Cardiologist Dr Ubaldo Glassing Reason for Consultation Chest Pain Angina  HPI: P patient presented to emergency room with chest pain symptoms known coronary disease previous PCI and stent of past patient has chronic anginal symptoms on Imdur and Ranexa COPD congestive heart failure mild cardiomyopathy with diabetes.  Patient complained of nonradiating chest pain requiring narcotics at times now presents for further evaluation  Review of systems complete and found to be negative unless listed above     Past Medical History:  Diagnosis Date  . A-fib ([place])   . Anemia   . Anginal pain ([place])   . Anxiety   . Arthritis   . Asthma   . CAD (coronary artery disease)    a. 2002 CABGx2 (LIMA->LAD, VG->VG->OM1);  b. 09/2012 DES->OM;  c. 03/2015 PTCA of LAD [facility]) in setting of atretic LIMA; d. 05/2015 Cath [facility]): nonobs dzs; e. 06/2015 Cath ([facility]): LM nl, LAD 45p/d ISR, 50d, D1/2 small, LCX 50p/d ISR, OM1 70ost, 30 ISR, VG->OM1 50ost, 73m LIMA->LAD 99p/d - atretic, RCA dom, nl; f.cath 10/16: 40-50%(FFR 0.90) pLAD, 75% (FFR 0.77) mLAD s/p PCI/DES, oRCA 40% (FFR0.95)  . Cancer ([place]    SKIN CANCER ON BACK  . Celiac disease   . Chronic diastolic CHF (congestive heart failure) ([place]    a. 06/2009 Echo: EF 60-65%, Gr 1 DD, triv AI, mildly dil LA, nl RV.  .Marland KitchenCOPD (chronic obstructive pulmonary disease) ([place]    a. Chronic bronchitis and emphysema.  . DDD (degenerative disc disease), lumbar   . Diverticulosis   . Dysrhythmia   . Essential hypertension   . GERD (gastroesophageal reflux disease)   . History of hiatal hernia   . History of kidney stones    H/O  . History of tobacco abuse    a. Quit 2014.  .Marland KitchenMyocardial infarction ([place] 2002   4 STENTS  . Pancreatitis   . PSVT (paroxysmal  supraventricular tachycardia) ([place]    a. 10/2012 Noted on Zio Patch.  . Sleep apnea    LOST CORD TO CPAP -ONLY 02 @ BEDTIME  . Tubular adenoma of colon   . Type II diabetes mellitus ([place]     Past Surgical History:  Procedure Laterality Date  . BYPASS GRAFT    . CARDIAC CATHETERIZATION N/A 07/12/2015   rocedure: Left Heart Cath and Cors/Grafts Angiography;  Surgeon: HBelva Crome MD;  Location: [facility];  Service: Cardiovascular;  Laterality: N/A;  . CARDIAC CATHETERIZATION Right 10/07/2015   Procedure: Left Heart Cath and Cors/Grafts Angiography;  Surgeon: SDionisio David MD;  Location: [facility];  Service: Cardiovascular;  Laterality: Right;  . CARDIAC CATHETERIZATION N/A 04/06/2016   Procedure: Left Heart Cath and Coronary Angiography;  Surgeon: DYolonda Kida MD;  [facility];  Service: Cardiovascular;  Laterality: N/A;  . CARDIAC CATHETERIZATION  04/06/2016   Procedure: Bypass Graft Angiography;  Surgeon: DYolonda Kida MD;  [facility]: [facility];  Service: Cardiovascular;;  . CARDIAC CATHETERIZATION N/A 11/02/2016   Procedure: Left Heart Cath and Cors/Grafts Angiography and possible PCI;  Surgeon: DYolonda Kida MD;  [facility];  Service: Cardiovascular;  Laterality: N/A;  . CARDIAC CATHETERIZATION N/A 11/02/2016   Procedure: Coronary  Stent Intervention;  Surgeon: Yolonda Kida, MD;  Location: [place] CV LAB;  Service: Cardiovascular;  Laterality: N/A;  . CHOLECYSTECTOMY    . CIRCUMCISION N/A 06/09/2019   Procedure: CIRCUMCISION ADULT;  Surgeon: Billey Co, MD;  Location: [facility] ORS;  Service: Urology;  Laterality: N/A;  . COLONOSCOPY WITH PROPOFOL N/A 04/01/2018   Procedure: COLONOSCOPY WITH PROPOFOL;  Surgeon: Manya Silvas, MD;  Location: [facility] ENDOSCOPY;  Service: Endoscopy;  Laterality: N/A;  . ESOPHAGEAL DILATION    . ESOPHAGOGASTRODUODENOSCOPY (EGD) WITH PROPOFOL N/A 04/01/2018    Procedure: ESOPHAGOGASTRODUODENOSCOPY (EGD) WITH PROPOFOL;  Surgeon: Manya Silvas, MD;  Location: [facility] ENDOSCOPY;  Service: Endoscopy;  Laterality: N/A;  . LEFT HEART CATH AND CORS/GRAFTS ANGIOGRAPHY N/A 06/12/2019   Procedure: LEFT HEART CATH AND CORS/GRAFTS ANGIOGRAPHY;  Surgeon: Teodoro Spray, MD;  Location: [place] CV LAB;  Service: Cardiovascular;  Laterality: N/A;  . LEFT HEART CATH AND CORS/GRAFTS ANGIOGRAPHY N/A 03/11/2020   Procedure: LEFT HEART CATH AND CORS/GRAFTS ANGIOGRAPHY;  Surgeon: Isaias Cowman, MD;  Location: [place] CV LAB;  Service: Cardiovascular;  Laterality: N/A;  . TONSILLECTOMY    . VASCULAR SURGERY      Medications Prior to Admission  Medication Sig Dispense Refill Last Dose  . albuterol (VENTOLIN HFA) 108 (90 Base) MCG/ACT inhaler INHALE 2 PUFFS BY MOUTH EVERY 6 HOURS AS NEEDED FOR SHORTNESS OF BREATH (Patient taking differently: Inhale 2 puffs into the lungs every 6 (six) hours as needed for wheezing or shortness of breath. ) 18 g 5 Unknown at PRN  . allopurinol (ZYLOPRIM) 300 MG tablet TAKE 1 TABLET BY MOUTH TWICE A DAY (Patient taking differently: Take 300 mg by mouth 2 (two) times daily. ) 180 tablet 0 09/26/2020 at 1030  . ALPRAZolam (XANAX) 1 MG tablet TAKE ONE TABLET BY MOUTH THREE TIMES A DAY (Patient taking differently: Take 1 mg by mouth 3 (three) times daily. ) 90 tablet 3 09/26/2020 at 1030  . amLODipine (NORVASC) 2.5 MG tablet Take 1 tablet (2.5 mg total) by mouth daily. 90 tablet 3 09/26/2020 at 1030  . aspirin 81 MG chewable tablet Chew 81 mg by mouth daily.     Marland Kitchen atorvastatin (LIPITOR) 80 MG tablet TAKE ONE TABLET BY MOUTH AT BEDTIME (Patient taking differently: Take 80 mg by mouth at bedtime. ) 90 tablet 1 09/25/2020 at 2100  . BREO ELLIPTA 100-25 MCG/INH AEPB INHALE 1 PUFF INTO THE LUNGS DAILY (Patient taking differently: Inhale 1 puff into the lungs daily. ) 60 each 5 09/26/2020 at 1030  . budesonide (PULMICORT) 0.5 MG/2ML  nebulizer solution Take 0.5 mg by nebulization 2 (two) times daily as needed (shortness of breath or wheezing).    Unknown at PRN  . cetirizine (ZYRTEC) 10 MG tablet Take 1 tablet (10 mg total) by mouth at bedtime. 90 tablet 1 09/25/2020 at 1030  . Cholecalciferol (VITAMIN D3) 125 MCG (5000 UT) CAPS Take 5,000 Units by mouth daily.      Marland Kitchen docusate sodium (COLACE) 100 MG capsule Take 100 mg by mouth daily.      Marland Kitchen ELIQUIS 5 MG TABS tablet TAKE 1 TABLET BY MOUTH TWICE A DAY 180 tablet 3 09/26/2020 at 1030  . FARXIGA 10 MG TABS tablet TAKE 1 TABLET BY MOUTH DAILY (Patient taking differently: Take 10 mg by mouth daily. ) 30 tablet 12 09/26/2020 at 1030  . isosorbide mononitrate (IMDUR) 120 MG 24 hr tablet TAKE 1 TABLET BY MOUTH DAILY (Patient taking differently: Take 120  mg by mouth daily. ) 90 tablet 0 09/26/2020 at 1030  . LINZESS 72 MCG capsule Take 72 mcg by mouth daily before breakfast.    09/26/2020 at 1000  . lisinopril (ZESTRIL) 10 MG tablet TAKE 1 TABLET BY MOUTH DAILY (Patient taking differently: Take 10 mg by mouth daily. ) 90 tablet 4 09/26/2020 at 1030  . magnesium oxide (MAG-OX) 400 (241.3 Mg) MG tablet Take 1 tablet (400 mg total) by mouth every morning. 30 tablet 5   . metaxalone (SKELAXIN) 800 MG tablet Take 1 tablet (800 mg total) by mouth 3 (three) times daily as needed (pain). 30 tablet 3 Unknown at PRN  . metFORMIN (GLUCOPHAGE-XR) 500 MG 24 hr tablet Take 1 tablet (500 mg total) by mouth daily with breakfast. 30 tablet 2 09/26/2020 at 1030  . naloxone (NARCAN) nasal spray 4 mg/0.1 mL 1 spray into nostril x1 and may repeat every 2-3 minutes until patient is responsive or EMS arrives 2 each 2 As directed at PRN  . nitroGLYCERIN (NITROSTAT) 0.4 MG SL tablet PLACE ONE TABLET UNDER THE TONGUE EVERY 5 MINUTES FOR CHEST PAIN. IF NO RELIEF PAST 3RD TAB GO TO EMERGENCY ROOM (Patient taking differently: Place 0.4 mg under the tongue every 5 (five) minutes as needed for chest pain. ) 30 tablet 3  Unknown at PRN  . omega-3 acid ethyl esters (LOVAZA) 1 g capsule TAKE 4 CAPSULES BY MOUTH DAILY (Patient taking differently: Take 4 g by mouth daily. ) 120 capsule 12   . omeprazole (PRILOSEC) 40 MG capsule Take 40 mg by mouth daily.    09/26/2020 at 1000  . oxyCODONE-acetaminophen (PERCOCET) 10-325 MG tablet TAKE 1 TABLET BY MOUTH EVERY 4 HOURS AS NEEDED FOR PAIN (Patient taking differently: Take 1 tablet by mouth every 4 (four) hours as needed for pain. ) 150 tablet 0 09/26/2020 at PRN  . ranolazine (RANEXA) 1000 MG SR tablet TAKE 1 TABLET BY MOUTH TWICE A DAY (Patient taking differently: Take 1,000 mg by mouth 2 (two) times daily. ) 60 tablet 5 09/26/2020 at 1030  . sotalol (BETAPACE) 80 MG tablet Take 80 mg by mouth 2 (two) times daily.   09/26/2020 at 1030  . tiotropium (SPIRIVA) 18 MCG inhalation capsule Place 18 mcg into inhaler and inhale daily.   09/25/2020 at Unknown time  . torsemide (DEMADEX) 100 MG tablet Take 0.5 tablets (50 mg total) by mouth daily. 30 tablet 3 09/26/2020 at 1030  . traZODone (DESYREL) 150 MG tablet TAKE ONE TABLET BY MOUTH AT BEDTIME (Patient taking differently: Take 150 mg by mouth at bedtime. ) 30 tablet 1 09/25/2020 at 2100  . venlafaxine XR (EFFEXOR-XR) 75 MG 24 hr capsule TAKE 1 CAPSULE BY MOUTH DAILY WITH BREAKFAST (Patient taking differently: Take 75 mg by mouth daily with breakfast. ) 90 capsule 4 09/26/2020 at 1030   Social History   Socioeconomic History  . Marital status: Widowed    Spouse name: Not on file  . Number of children: 1  . Years of education: Not on file  . Highest education level: 10th grade  Occupational History  . Occupation: Disabled  . Occupation: on Fish farm manager  Tobacco Use  . Smoking status: Former Smoker    Packs/day: 3.00    Years: 50.00    Pack years: 150.00    Types: Cigarettes    Quit date: 04/22/2013    Years since quitting: 7.4  . Smokeless tobacco: Never Used  Vaping Use  . Vaping Use: Never used  Substance and  Sexual Activity  . Alcohol use: No    Comment: remotely quit alcohol use. Hx of heavy alcohol use.  . Drug use: Yes    Types: Marijuana    Comment: occasionally  . Sexual activity: Not on file  Other Topics Concern  . Not on file  Social History Narrative   Pt lives in [place] with wife.  Does not routinely exercise.   Social Determinants of Health   Financial Resource Strain: Low Risk   . Difficulty of Paying Living Expenses: Not hard at all  Food Insecurity: No Food Insecurity  . Worried About Charity fundraiser in the Last Year: Never true  . Ran Out of Food in the Last Year: Never true  Transportation Needs: No Transportation Needs  . Lack of Transportation (Medical): No  . Lack of Transportation (Non-Medical): No  Physical Activity: Inactive  . Days of Exercise per Week: 0 days  . Minutes of Exercise per Session: 0 min  Stress: No Stress Concern Present  . Feeling of Stress : Only a little  Social Connections: Moderately Isolated  . Frequency of Communication with Friends and Family: More than three times a week  . Frequency of Social Gatherings with Friends and Family: More than three times a week  . Attends Religious Services: More than 4 times per year  . Active Member of Clubs or Organizations: No  . Attends Archivist Meetings: Never  . Marital Status: Widowed  Intimate Partner Violence: Not At Risk  . Fear of Current or Ex-Partner: No  . Emotionally Abused: No  . Physically Abused: No  . Sexually Abused: No    Family History  Problem Relation Age of Onset  . Heart attack Mother   . Depression Mother   . Heart disease Mother   . COPD Mother   . Hypertension Mother   . Heart attack Father   . Diabetes Father   . Depression Father   . Heart disease Father   . Cirrhosis Father   . Parkinson's disease Brother       Review of systems complete and found to be negative unless listed above      PHYSICAL EXAM  General: Well developed, well  nourished, in no acute distress HEENT:  Normocephalic and atramatic Neck:  No JVD.  Lungs: Clear bilaterally to auscultation and percussion. Heart: HRRR . Normal S1 and S2 without gallops or murmurs.  Abdomen: Bowel sounds are positive, abdomen soft and non-tender  Msk:  Back normal, normal gait. Normal strength and tone for age. Extremities: No clubbing, cyanosis or edema.   Neuro: Alert and oriented X 3. Psych:  Good affect, responds appropriately  Labs:   Lab Results  Component Value Date   WBC 7.0 09/27/2020   HGB 18.3 (H) 09/27/2020   HCT 54.1 (H) 09/27/2020   MCV 93.4 09/27/2020   PLT 167 09/27/2020    Recent Labs  Lab 09/24/20 1105 09/26/20 1800 09/27/20 0306  NA 139   < > 136  K 5.7*   < > 3.8  CL 96   < > 98  CO2 28   < > 30  BUN 8   < > 18  CREATININE 0.94   < > 1.02  CALCIUM 10.8*   < > 9.2  PROT 7.4  --   --   BILITOT 0.5  --   --   ALKPHOS 115  --   --   ALT 22  --   --  AST 24  --   --   GLUCOSE 256*   < > 213*   < > = values in this interval not displayed.   Lab Results  Component Value Date   CKTOTAL 93 05/23/2014   CKMB 1.7 05/23/2014   TROPONINI <0.03 12/15/2018    Lab Results  Component Value Date   CHOL 215 (H) 09/27/2020   CHOL 170 03/08/2020   CHOL 110 09/09/2019   Lab Results  Component Value Date   HDL 29 (L) 09/27/2020   HDL 28 (L) 03/08/2020   HDL 31 (L) 09/09/2019   Lab Results  Component Value Date   LDLCALC UNABLE TO CALCULATE IF TRIGLYCERIDE OVER 400 mg/dL 09/27/2020   LDLCALC UNABLE TO CALCULATE IF TRIGLYCERIDE OVER 400 mg/dL 03/08/2020   LDLCALC 44 09/09/2019   Lab Results  Component Value Date   TRIG 488 (H) 09/27/2020   TRIG 514 (H) 03/08/2020   TRIG 175 (H) 09/09/2019   Lab Results  Component Value Date   CHOLHDL 7.4 09/27/2020   CHOLHDL 6.1 03/08/2020   CHOLHDL 3.5 09/09/2019   Lab Results  Component Value Date   LDLDIRECT 122.1 (H) 09/27/2020   LDLDIRECT 84.1 03/08/2020   LDLDIRECT 58 05/29/2019       Radiology: DG Chest 2 View  Result Date: 09/26/2020 CLINICAL DATA:  Chest pain EXAM: CHEST - 2 VIEW COMPARISON:  08/10/2020 FINDINGS: Post sternotomy changes. No focal opacity or pleural effusion. Stable cardiomediastinal silhouette with aortic atherosclerosis. No pneumothorax. IMPRESSION: No active cardiopulmonary disease. Electronically Signed   By: Donavan Foil M.D.   On: 09/26/2020 18:54    EKG normal sinus rhythm nonspecific st-tw changes  ASSESSMENT AND PLAN:  Possible Angina CAD Hx CABG Chronic CP HTN Chronic pain Hyperlipidemia.  . Plan Agree with rule out myocardial infarction Follow-up troponins and EKGs Probably [place] ischemia Continue lipid management Recommend follow-up with echocardiogram Continue [place] for Myoview for chest pain Consider discontinuing aspirin and statin continue conservative management for his third   Signed: Yolonda Kida MD 09/27/2020, 2:31 PM

## 2020-09-27 NOTE — Consult Note (Signed)
ANTICOAGULATION CONSULT NOTE - Consult  Pharmacy Consult for Heparin Indication: chest pain/ACS  Allergies  Allergen Reactions  . Demerol  [Meperidine Hcl]   . Demerol [Meperidine] Hives  . Jardiance [Empagliflozin] Other (See Comments)    Perineal pain  . Prednisone Other (See Comments) and Hypertension    Pt states that this medication puts him in A-fib   . Sulfa Antibiotics Hives  . Albuterol Sulfate [Albuterol] Palpitations and Other (See Comments)    Pt currently uses this medication.    . Morphine Sulfate Nausea And Vomiting, Rash and Other (See Comments)    Pt states that he is only allergic to the tablet form of this medication.      Patient Measurements: Height: 5' 7"  (170.2 cm) Weight: 98.4 kg (217 lb) IBW/kg (Calculated) : 66.1 Heparin Dosing Weight: 87.4  Vital Signs: Temp: 97.9 F (36.6 C) (11/11 2315) Temp Source: Oral (11/11 2315) BP: 139/81 (11/11 2315) Pulse Rate: 58 (11/11 2315)  Labs: Recent Labs    09/24/20 1105 09/26/20 1800 09/26/20 1858 09/27/20 0306  HGB 20.5* 19.6*  --   --   HCT 62.2* 58.3*  --   --   PLT 164 205  --   --   APTT  --   --  34 54*  LABPROT  --   --   --  14.1  INR  --   --   --  1.1  HEPARINUNFRC  --   --  1.62*  --   CREATININE 0.94 1.24  --   --   TROPONINIHS  --  31* 33*  --     Estimated Creatinine Clearance: 65.5 mL/min (by C-G formula based on SCr of 1.24 mg/dL).   Medical History: Past Medical History:  Diagnosis Date  . A-fib ([place])   . Anemia   . Anginal pain ([place])   . Anxiety   . Arthritis   . Asthma   . CAD (coronary artery disease)    a. 2002 CABGx2 (LIMA->LAD, VG->VG->OM1);  b. 09/2012 DES->OM;  c. 03/2015 PTCA of LAD [facility]) in setting of atretic LIMA; d. 05/2015 Cath [facility]): nonobs dzs; e. 06/2015 Cath ([facility]): LM nl, LAD 45p/d ISR, 50d, D1/2 small, LCX 50p/d ISR, OM1 70ost, 30 ISR, VG->OM1 50ost, 18m LIMA->LAD 99p/d - atretic, RCA dom, nl; f.cath 10/16: 40-50%(FFR 0.90) pLAD, 75% (FFR 0.77) mLAD s/p  PCI/DES, oRCA 40% (FFR0.95)  . Cancer ([place]    SKIN CANCER ON BACK  . Celiac disease   . Chronic diastolic CHF (congestive heart failure) ([place]    a. 06/2009 Echo: EF 60-65%, Gr 1 DD, triv AI, mildly dil LA, nl RV.  .Marland KitchenCOPD (chronic obstructive pulmonary disease) ([place]    a. Chronic bronchitis and emphysema.  . DDD (degenerative disc disease), lumbar   . Diverticulosis   . Dysrhythmia   . Essential hypertension   . GERD (gastroesophageal reflux disease)   . History of hiatal hernia   . History of kidney stones    H/O  . History of tobacco abuse    a. Quit 2014.  .Marland KitchenMyocardial infarction ([place] 2002   4 STENTS  . Pancreatitis   . PSVT (paroxysmal supraventricular tachycardia) ([place]    a. 10/2012 Noted on Zio Patch.  . Sleep apnea    LOST CORD TO CPAP -ONLY 02 @ BEDTIME  . Tubular adenoma of colon   . Type II diabetes mellitus (HCC)     Medications:  Pt on Eliquis 5 mg BID prior to  admission   Assessment: 64 year old male presenting with chest pain. PMH includes CAD s/p CABG and stent, afib, COPD, DM, HTN, HLD, arthritis, asthma, anxiety, anemia, and heart failure. Pt takes Eliquis prior to admission - reports last dose was this morning. Hgb 19.6, plt WNL.   Goal of Therapy:  Heparin level 0.3-0.7 units/ml aPTT 66-102 seconds Monitor platelets by anticoagulation protocol: Yes   Plan:  Increase heparin infusion to 1350 units/hr, no bolus due to recent Eliquis Check aPTT level in 6 hours and daily while on heparin Continue to monitor H&H and platelets  Ena Dawley, PharmD 09/27/2020 3:38 AM

## 2020-09-28 ENCOUNTER — Inpatient Hospital Stay: Payer: Medicare HMO

## 2020-09-28 ENCOUNTER — Inpatient Hospital Stay
Admit: 2020-09-28 | Discharge: 2020-09-28 | Disposition: A | Discharge status: Home / Self Care | Payer: Medicare HMO | Attending: Student | Admitting: Student

## 2020-09-28 DIAGNOSIS — G894 Chronic pain syndrome: Secondary | ICD-10-CM

## 2020-09-28 DIAGNOSIS — E785 Hyperlipidemia, unspecified: Secondary | ICD-10-CM

## 2020-09-28 DIAGNOSIS — R0789 Other chest pain: Secondary | ICD-10-CM

## 2020-09-28 DIAGNOSIS — I214 Non-ST elevation (NSTEMI) myocardial infarction: Secondary | ICD-10-CM

## 2020-09-28 DIAGNOSIS — E1121 Type 2 diabetes mellitus with diabetic nephropathy: Secondary | ICD-10-CM

## 2020-09-28 DIAGNOSIS — F339 Major depressive disorder, recurrent, unspecified: Secondary | ICD-10-CM

## 2020-09-28 LAB — ECHOCARDIOGRAM COMPLETE
AR max vel: 2.52 cm2
AV Peak grad: 6.2 mmHg
Ao pk vel: 1.24 m/s
Area-P 1/2: 2.71 cm2
Height: 67 in
S' Lateral: 3.59 cm
Weight: 3463.87 oz

## 2020-09-28 LAB — CBC
HCT: 53.6 % — ABNORMAL HIGH (ref 39.0–52.0)
Hemoglobin: 17.6 g/dL — ABNORMAL HIGH (ref 13.0–17.0)
MCH: 31.2 pg (ref 26.0–34.0)
MCHC: 32.8 g/dL (ref 30.0–36.0)
MCV: 95 fL (ref 80.0–100.0)
Platelets: 148 10*3/uL — ABNORMAL LOW (ref 150–400)
RBC: 5.64 MIL/uL (ref 4.22–5.81)
RDW: 13.7 % (ref 11.5–15.5)
WBC: 5.8 10*3/uL (ref 4.0–10.5)
nRBC: 0 % (ref 0.0–0.2)

## 2020-09-28 LAB — NM MYOCAR MULTI W/SPECT W/WALL MOTION / EF
Estimated workload: 1 METS
Exercise duration (min): 1 min
Exercise duration (sec): 0 s
LV dias vol: 127 mL (ref 62–150)
LV sys vol: 65 mL
MPHR: 154 {beats}/min
Peak HR: 79 {beats}/min
Percent HR: 51 %
Rest HR: 58 {beats}/min
SDS: 7
SRS: 15
SSS: 21
TID: 0.99

## 2020-09-28 LAB — GLUCOSE, CAPILLARY
Glucose-Capillary: 153 mg/dL — ABNORMAL HIGH (ref 70–99)
Glucose-Capillary: 168 mg/dL — ABNORMAL HIGH (ref 70–99)
Glucose-Capillary: 170 mg/dL — ABNORMAL HIGH (ref 70–99)
Glucose-Capillary: 170 mg/dL — ABNORMAL HIGH (ref 70–99)

## 2020-09-28 LAB — HEPARIN LEVEL (UNFRACTIONATED): Heparin Unfractionated: 0.65 IU/mL (ref 0.30–0.70)

## 2020-09-28 LAB — APTT
aPTT: 105 seconds — ABNORMAL HIGH (ref 24–36)
aPTT: 108 seconds — ABNORMAL HIGH (ref 24–36)
aPTT: 58 seconds — ABNORMAL HIGH (ref 24–36)

## 2020-09-28 MED ORDER — PERFLUTREN LIPID MICROSPHERE
1.0000 mL | INTRAVENOUS | Status: AC | PRN
  Administered 2020-09-28: 7 mL via INTRAVENOUS
  Filled 2020-09-28: qty 10

## 2020-09-28 MED ORDER — TECHNETIUM TC 99M TETROFOSMIN IV KIT
10.0000 | PACK | Freq: Once | INTRAVENOUS | Status: AC | PRN
  Administered 2020-09-28: 10.66 via INTRAVENOUS

## 2020-09-28 MED ORDER — REGADENOSON 0.4 MG/5ML IV SOLN
0.4000 mg | Freq: Once | INTRAVENOUS | Status: AC
  Administered 2020-09-28: 0.4 mg via INTRAVENOUS
  Filled 2020-09-28: qty 5

## 2020-09-28 MED ORDER — TECHNETIUM TC 99M TETROFOSMIN IV KIT
30.0000 | PACK | Freq: Once | INTRAVENOUS | Status: AC | PRN
  Administered 2020-09-28: 30.71 via INTRAVENOUS

## 2020-09-28 NOTE — Progress Notes (Signed)
PT Cancellation Note  Patient Details Name: Lawrence [name] MRN: [id number] DOB: Oct 17, 1956   Cancelled Treatment:    Reason Eval/Treat Not Completed: Patient declined, no reason specified. Patient is having 7/10 chest pain and does not feel well enough to participate in PT evaluation.    [address] [place], [place] DPT 09/28/2020, 3:30 PM

## 2020-09-28 NOTE — Progress Notes (Signed)
OT Cancellation Note  Patient Details Name: JAHZEEL POYTHRESS MRN: [id number] DOB: Mar 14, 1956   Cancelled Treatment:    Reason Eval/Treat Not Completed: Patient declined, no reason specified (Pt. declined intevention this afternoon 2/2 fatigue from testing earlier in the day. Will reattempt the OT initial eval on the next available date.)  Harrel Carina, MS, OTR/L 09/28/2020, 4:18 PM

## 2020-09-28 NOTE — Progress Notes (Signed)
Pt c/o back pain 6/10 at 1100.  RN administered PO percocet, pt dissatisfied, requested morphine.  No active order for morphine at this time.  At 1230 pt c/o 7/10 chest pain.  Pt resting comfortably in bed, 12 lead completed, reviewed and placed in chart, vital signs WDL.  MD notified, no new orders at this time. RN will continue to monitor.  Justice Britain, RN 09/28/2020 1:05 PM

## 2020-09-28 NOTE — Consult Note (Signed)
ANTICOAGULATION CONSULT NOTE - Consult  Pharmacy Consult for Heparin Indication: chest pain/ACS  Allergies  Allergen Reactions  . Demerol  [Meperidine Hcl]   . Demerol [Meperidine] Hives  . Jardiance [Empagliflozin] Other (See Comments)    Perineal pain  . Prednisone Other (See Comments) and Hypertension    Pt states that this medication puts him in A-fib   . Sulfa Antibiotics Hives  . Albuterol Sulfate [Albuterol] Palpitations and Other (See Comments)    Pt currently uses this medication.    . Morphine Sulfate Nausea And Vomiting, Rash and Other (See Comments)    Pt states that he is only allergic to the tablet form of this medication.      Patient Measurements: Height: 5' 7"  (170.2 cm) Weight: 98.2 kg (216 lb 7.9 oz) IBW/kg (Calculated) : 66.1 Heparin Dosing Weight: 87.4  Vital Signs: Temp: 97.8 F (36.6 C) (11/13 0822) Temp Source: Oral (11/13 0822) BP: 113/59 (11/13 0822) Pulse Rate: 57 (11/13 0822)  Labs: Recent Labs    09/26/20 1800 09/26/20 1800 09/26/20 1858 09/27/20 0306 09/27/20 1032 09/27/20 1242 09/27/20 1501 09/27/20 1707 09/28/20 0012 09/28/20 0816  HGB 19.6*   < >  --  18.3*  --   --   --   --   --  17.6*  HCT 58.3*  --   --  54.1*  --   --   --   --   --  53.6*  PLT 205  --   --  167  --   --   --   --   --  148*  APTT  --    < > 34 54*   < >  --   --  63* 105* 108*  LABPROT  --   --   --  14.1  --   --   --   --   --   --   INR  --   --   --  1.1  --   --   --   --   --   --   HEPARINUNFRC  --   --  1.62*  --   --   --   --   --   --  0.65  CREATININE 1.24  --   --  1.02  --   --   --   --   --   --   TROPONINIHS 31*   < > 33*  --   --  22* 23*  --   --   --    < > = values in this interval not displayed.    Estimated Creatinine Clearance: 79.5 mL/min (by C-G formula based on SCr of 1.02 mg/dL).   Medical History: Past Medical History:  Diagnosis Date  . A-fib ([place])   . Anemia   . Anginal pain ([place])   . Anxiety   . Arthritis   .  Asthma   . CAD (coronary artery disease)    a. 2002 CABGx2 (LIMA->LAD, VG->VG->OM1);  b. 09/2012 DES->OM;  c. 03/2015 PTCA of LAD [facility]) in setting of atretic LIMA; d. 05/2015 Cath [facility]): nonobs dzs; e. 06/2015 Cath ([facility]): LM nl, LAD 45p/d ISR, 50d, D1/2 small, LCX 50p/d ISR, OM1 70ost, 30 ISR, VG->OM1 50ost, 69m LIMA->LAD 99p/d - atretic, RCA dom, nl; f.cath 10/16: 40-50%(FFR 0.90) pLAD, 75% (FFR 0.77) mLAD s/p PCI/DES, oRCA 40% (FFR0.95)  . Cancer ([place]    SKIN CANCER ON BACK  . Celiac disease   .  Chronic diastolic CHF (congestive heart failure) ([place])    a. 06/2009 Echo: EF 60-65%, Gr 1 DD, triv AI, mildly dil LA, nl RV.  Marland Kitchen COPD (chronic obstructive pulmonary disease) ([place])    a. Chronic bronchitis and emphysema.  . DDD (degenerative disc disease), lumbar   . Diverticulosis   . Dysrhythmia   . Essential hypertension   . GERD (gastroesophageal reflux disease)   . History of hiatal hernia   . History of kidney stones    H/O  . History of tobacco abuse    a. Quit 2014.  Marland Kitchen Myocardial infarction ([place]) 2002   4 STENTS  . Pancreatitis   . PSVT (paroxysmal supraventricular tachycardia) ([place])    a. 10/2012 Noted on Zio Patch.  . Sleep apnea    LOST CORD TO CPAP -ONLY 02 @ BEDTIME  . Tubular adenoma of colon   . Type II diabetes mellitus (HCC)    Date APTT Dose Rate 11/11 54 1150 (inc'd to 1350 units/hr) 11/11 70 1350 units/hr 11/12 63 (see note below)    Medications:  Pt on Eliquis 5 mg BID prior to admission   Assessment: 64 year old male presenting with chest pain. PMH includes CAD s/p CABG and stent, afib, COPD, DM, HTN, HLD, arthritis, asthma, anxiety, anemia, and heart failure. Pt takes Eliquis prior to admission - reports last dose was this morning. Hgb 19.6, plt WNL.   -1st therapeutic aPTT at rate of 1350units/hr, will recheck for 2 consecutive therapeutic then daily.  - aPTT 63. Slightly subtherapeutic. Confirmed with nurse no heparin interruptions or issues with line.   11/13 @ 0012 aPTT 105, slightly SUPRAtherapeutic.Decrease heparin infusion to 1400 units/hr 11/13 @ 0816 aPTT=108, slightly supratherapeutic. (HL= 0.65).  Goal of Therapy:  Heparin level 0.3-0.7 units/ml aPTT 66-102 seconds (baseline 34) Monitor platelets by anticoagulation protocol: Yes   Plan:  11/13 @ 0816 aPTT=108, slightly supratherapeutic. (HL= 0.65).  Heparin plan: Decrease heparin infusion to 1300 units/hr. Nursing made aware of changes.  Recheck aPTT level in 6 hours and daily while on heparin. Will check heparin level with AM labs- close to correlating.   Continue to monitor H&H and platelets with AM labs  Les Longmore A, PharmD 09/28/2020 9:19 AM

## 2020-09-28 NOTE — Consult Note (Signed)
ANTICOAGULATION CONSULT NOTE - Consult  Pharmacy Consult for Heparin Indication: chest pain/ACS  Allergies  Allergen Reactions  . Demerol  [Meperidine Hcl]   . Demerol [Meperidine] Hives  . Jardiance [Empagliflozin] Other (See Comments)    Perineal pain  . Prednisone Other (See Comments) and Hypertension    Pt states that this medication puts him in A-fib   . Sulfa Antibiotics Hives  . Albuterol Sulfate [Albuterol] Palpitations and Other (See Comments)    Pt currently uses this medication.    . Morphine Sulfate Nausea And Vomiting, Rash and Other (See Comments)    Pt states that he is only allergic to the tablet form of this medication.      Patient Measurements: Height: 5' 7"  (170.2 cm) Weight: 98.2 kg (216 lb 6.4 oz) IBW/kg (Calculated) : 66.1 Heparin Dosing Weight: 87.4  Vital Signs: Temp: 97.7 F (36.5 C) (11/13 0035) Temp Source: Oral (11/13 0035) BP: 114/63 (11/13 0035) Pulse Rate: 60 (11/13 0035)  Labs: Recent Labs    09/26/20 1800 09/26/20 1800 09/26/20 1858 09/26/20 1858 09/27/20 0306 09/27/20 0306 09/27/20 1032 09/27/20 1242 09/27/20 1501 09/27/20 1707 09/28/20 0012  HGB 19.6*  --   --   --  18.3*  --   --   --   --   --   --   HCT 58.3*  --   --   --  54.1*  --   --   --   --   --   --   PLT 205  --   --   --  167  --   --   --   --   --   --   APTT  --   --  34   < > 54*   < > 70*  --   --  63* 105*  LABPROT  --   --   --   --  14.1  --   --   --   --   --   --   INR  --   --   --   --  1.1  --   --   --   --   --   --   HEPARINUNFRC  --   --  1.62*  --   --   --   --   --   --   --   --   CREATININE 1.24  --   --   --  1.02  --   --   --   --   --   --   TROPONINIHS 31*   < > 33*  --   --   --   --  22* 23*  --   --    < > = values in this interval not displayed.    Estimated Creatinine Clearance: 79.5 mL/min (by C-G formula based on SCr of 1.02 mg/dL).   Medical History: Past Medical History:  Diagnosis Date  . A-fib ([place])   . Anemia    . Anginal pain ([place])   . Anxiety   . Arthritis   . Asthma   . CAD (coronary artery disease)    a. 2002 CABGx2 (LIMA->LAD, VG->VG->OM1);  b. 09/2012 DES->OM;  c. 03/2015 PTCA of LAD [facility]) in setting of atretic LIMA; d. 05/2015 Cath [facility]): nonobs dzs; e. 06/2015 Cath ([facility]): LM nl, LAD 45p/d ISR, 50d, D1/2 small, LCX 50p/d ISR, OM1 70ost, 30 ISR, VG->OM1 50ost, 57m LIMA->LAD  99p/d - atretic, RCA dom, nl; f.cath 10/16: 40-50%(FFR 0.90) pLAD, 75% (FFR 0.77) mLAD s/p PCI/DES, oRCA 40% (FFR0.95)  . Cancer ([place])    SKIN CANCER ON BACK  . Celiac disease   . Chronic diastolic CHF (congestive heart failure) ([place])    a. 06/2009 Echo: EF 60-65%, Gr 1 DD, triv AI, mildly dil LA, nl RV.  Marland Kitchen COPD (chronic obstructive pulmonary disease) ([place])    a. Chronic bronchitis and emphysema.  . DDD (degenerative disc disease), lumbar   . Diverticulosis   . Dysrhythmia   . Essential hypertension   . GERD (gastroesophageal reflux disease)   . History of hiatal hernia   . History of kidney stones    H/O  . History of tobacco abuse    a. Quit 2014.  Marland Kitchen Myocardial infarction ([place]) 2002   4 STENTS  . Pancreatitis   . PSVT (paroxysmal supraventricular tachycardia) ([place])    a. 10/2012 Noted on Zio Patch.  . Sleep apnea    LOST CORD TO CPAP -ONLY 02 @ BEDTIME  . Tubular adenoma of colon   . Type II diabetes mellitus (HCC)    Date APTT Dose Rate 11/11 54 1150 (inc'd to 1350 units/hr) 11/11 70 1350 units/hr 11/12 63 (see note below)    Medications:  Pt on Eliquis 5 mg BID prior to admission   Assessment: 64 year old male presenting with chest pain. PMH includes CAD s/p CABG and stent, afib, COPD, DM, HTN, HLD, arthritis, asthma, anxiety, anemia, and heart failure. Pt takes Eliquis prior to admission - reports last dose was this morning. Hgb 19.6, plt WNL.   -1st therapeutic aPTT at rate of 1350units/hr, will recheck for 2 consecutive therapeutic then daily.  - aPTT 63. Slightly subtherapeutic. Confirmed with  nurse no heparin interruptions or issues with line.  11/13 @ 0012 aPTT 105, slightly SUPRAtherapeutic  Goal of Therapy:  Heparin level 0.3-0.7 units/ml aPTT 66-102 seconds (baseline 34) Monitor platelets by anticoagulation protocol: Yes   Plan:   Heparin plan: Decrease heparin infusion to 1400 units/hr. Nursing made aware of changes.  Recheck aPTT level in 6 hours and daily while on heparin. Will check heparin level with AM labs.   Continue to monitor H&H and platelets with AM labs  Ena Dawley, PharmD 09/28/2020 1:32 AM

## 2020-09-28 NOTE — Plan of Care (Signed)
  Problem: Education: Goal: Understanding of CV disease, CV risk reduction, and recovery process will improve Outcome: Adequate for Discharge Goal: Individualized Educational Video(s) Outcome: Adequate for Discharge   Problem: Activity: Goal: Ability to return to baseline activity level will improve Outcome: Adequate for Discharge   Problem: Cardiovascular: Goal: Ability to achieve and maintain adequate cardiovascular perfusion will improve Outcome: Adequate for Discharge Goal: Vascular access site(s) Level 0-1 will be maintained Outcome: Adequate for Discharge   Problem: Health Behavior/Discharge Planning: Goal: Ability to safely manage health-related needs after discharge will improve Outcome: Adequate for Discharge   Problem: Education: Goal: Knowledge of General Education information will improve Description: Including pain rating scale, medication(s)/side effects and non-pharmacologic comfort measures Outcome: Adequate for Discharge   Problem: Health Behavior/Discharge Planning: Goal: Ability to manage health-related needs will improve Outcome: Adequate for Discharge   Problem: Clinical Measurements: Goal: Ability to maintain clinical measurements within normal limits will improve Outcome: Adequate for Discharge Goal: Will remain free from infection Outcome: Adequate for Discharge Goal: Diagnostic test results will improve Outcome: Adequate for Discharge Goal: Respiratory complications will improve Outcome: Adequate for Discharge Goal: Cardiovascular complication will be avoided Outcome: Adequate for Discharge   Problem: Activity: Goal: Risk for activity intolerance will decrease Outcome: Adequate for Discharge   Problem: Nutrition: Goal: Adequate nutrition will be maintained Outcome: Adequate for Discharge   Problem: Coping: Goal: Level of anxiety will decrease Outcome: Adequate for Discharge   Problem: Elimination: Goal: Will not experience complications  related to bowel motility Outcome: Adequate for Discharge Goal: Will not experience complications related to urinary retention Outcome: Adequate for Discharge   Problem: Pain Managment: Goal: General experience of comfort will improve Outcome: Adequate for Discharge   Problem: Safety: Goal: Ability to remain free from injury will improve Outcome: Adequate for Discharge   Problem: Skin Integrity: Goal: Risk for impaired skin integrity will decrease Outcome: Adequate for Discharge

## 2020-09-28 NOTE — Progress Notes (Signed)
*  PRELIMINARY RESULTS* Echocardiogram 2D Echocardiogram has been performed. Definity IV Contrast used on this study.  Lawrence [name] 09/28/2020, 11:49 AM

## 2020-09-28 NOTE — Progress Notes (Signed)
[facility]    SUBJECTIVE: Patient still with significant chest pain symptoms recurrent persistent relatively chronic no EKG changes or troponin changes.  Patient requires narcotic therapy to help with symptom control   Vitals:   09/28/20 0815 09/28/20 0816 09/28/20 0821 09/28/20 0822  BP:    (!) 113/59  Pulse: 65 (!) 58 (!) 56 (!) 57  Resp: 11 13    Temp:    97.8 F (36.6 C)  TempSrc:    Oral  SpO2: 99% 97% 98% 96%  Weight:      Height:         Intake/Output Summary (Last 24 hours) at 09/28/2020 1109 Last data filed at 09/28/2020 1104 Gross per 24 hour  Intake 962.62 ml  Output 1605 ml  Net -642.38 ml      PHYSICAL EXAM  General: Well developed, well nourished, in no acute distress HEENT:  Normocephalic and atramatic Neck:  No JVD.  Lungs: Clear bilaterally to auscultation and percussion. Heart: HRRR . Normal S1 and S2 without gallops or murmurs.  Abdomen: Bowel sounds are positive, abdomen soft and non-tender  Msk:  Back normal, normal gait. Normal strength and tone for age. Extremities: No clubbing, cyanosis or edema.   Neuro: Alert and oriented X 3. Psych:  Good affect, responds appropriately   LABS: Basic Metabolic Panel: Recent Labs    09/26/20 1800 09/27/20 0306  NA 132* 136  K 3.6 3.8  CL 93* 98  CO2 30 30  GLUCOSE 211* 213*  BUN 15 18  CREATININE 1.24 1.02  CALCIUM 9.2 9.2   Liver Function Tests: No results for input(s): AST, ALT, ALKPHOS, BILITOT, PROT, ALBUMIN in the last 72 hours. No results for input(s): LIPASE, AMYLASE in the last 72 hours. CBC: Recent Labs    09/27/20 0306 09/28/20 0816  WBC 7.0 5.8  HGB 18.3* 17.6*  HCT 54.1* 53.6*  MCV 93.4 95.0  PLT 167 148*   Cardiac Enzymes: No results for input(s): CKTOTAL, CKMB, CKMBINDEX, TROPONINI in the last 72 hours. BNP: Invalid input(s): POCBNP D-Dimer: No results for input(s): DDIMER in the last 72 hours. Hemoglobin A1C: No results for input(s): HGBA1C in the last 72  hours. Fasting Lipid Panel: Recent Labs    09/27/20 0306  CHOL 215*  HDL 29*  LDLCALC UNABLE TO CALCULATE IF TRIGLYCERIDE OVER 400 mg/dL  TRIG 488*  CHOLHDL 7.4  LDLDIRECT 122.1*   Thyroid Function Tests: No results for input(s): TSH, T4TOTAL, T3FREE, THYROIDAB in the last 72 hours.  Invalid input(s): FREET3 Anemia Panel: No results for input(s): VITAMINB12, FOLATE, FERRITIN, TIBC, IRON, RETICCTPCT in the last 72 hours.  DG Chest 2 View  Result Date: 09/26/2020 CLINICAL DATA:  Chest pain EXAM: CHEST - 2 VIEW COMPARISON:  08/10/2020 FINDINGS: Post sternotomy changes. No focal opacity or pleural effusion. Stable cardiomediastinal silhouette with aortic atherosclerosis. No pneumothorax. IMPRESSION: No active cardiopulmonary disease. Electronically Signed   By: Donavan Foil M.D.   On: 09/26/2020 18:54     Echo mildly reduced left ventricular function EF around 40 to 45%  TELEMETRY: Normal sinus rhythm rate of 70 nonspecific findings  ASSESSMENT AND PLAN:  Principal Problem:   NSTEMI (non-ST elevated myocardial infarction) ([place]) Active Problems:   CAD (coronary artery disease)   HTN (hypertension)   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HCC)   HLD (hyperlipidemia)   COPD (chronic obstructive pulmonary disease) (HCC)   BPH (benign prostatic hyperplasia)   Paroxysmal atrial fibrillation (HCC)   Recurrent major depressive disorder, remission  status unspecified ([place])   Primary insomnia    Plan Patient still having recurrent chest pain possible angina no objective findings Negative troponin suggestive of demand ischemia Status post Lexiscan Myoview awaiting images Recommend conservative medical therapy Nitrates [place] Do not recommend narcotics both consider referring the patient to a pain clinic  Have the patient follow-up with Dr. Ubaldo Glassing as an outpatient 1 to 2 weeks   Yolonda Kida, MD 09/28/2020 11:09 AM

## 2020-09-28 NOTE — Progress Notes (Signed)
OT Cancellation Note  Patient Details Name: Lawrence [name] MRN: [id number] DOB: [birth year]   Cancelled Treatment:    Reason Eval/Treat Not Completed: Patient at procedure or test/ unavailable (Pt. is out of his room when attempted the OT inital eval this a.m. WIll reattempt at a later time, or date.)  Harrel Carina, MS, OTR/L 09/28/2020, 10:09 AM

## 2020-09-28 NOTE — Discharge Summary (Signed)
Physician Discharge Summary  [name] [name]:[id number] DOB: 1956/05/08 DOA: 09/26/2020  PCP: Lawrence Sons, MD  Admit date: 09/26/2020 Discharge date: 09/28/2020  Admitted From: Home Disposition: Home  Recommendations for Outpatient Follow-up:  1. Follow ups as below. 2. Please obtain CBC/BMP/Mag at follow up 3. Please follow up on the following pending results: None  Home Health: Patient did not participate in therapy. Equipment/Devices: None  Discharge Condition: Stable CODE STATUS: Full code   Follow-up Information    Lawrence Sons, MD. Schedule an appointment as soon as possible for a visit in 1 week(s).   Specialty: Family Medicine Contact information: [address] [place] [address] [phone]        Lawrence Spray, MD. Schedule an appointment as soon as possible for a visit in 1 week(s).   Specialty: Cardiology Contact information: [place] [address] [phone]               Hospital Course: 64 year old male with PMH of CAD/CABG and stents, paroxysmal A. 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  In ED, hemodynamically stable.  92% on RA.  Hgb 19.6.  HCT 58. Na 132.  Twelve-lead EKG sinus rhythm with PACs and new TWI in lateral leads.  High-sensitivity troponin 31.  Received sublingual nitro and started on heparin infusion for presumed NSTEMI after discussion between EDP and cardiologist on-call.  The next day, patient was evaluated by the cardiology.  He was continued on IV heparin.  He continued to have intermittent chest pain that improves "only with IV morphine".  His serial EKG remained unchanged.  Troponin remained low.  On the day of discharge, he had  a Lexiscan that showed findings consistent with prior myocardial infarction but no acute finding.  TTE with EF of 45 to 50% (unchanged), RWMA (new since his TTE in 02/2020).  Patient continued to have intermittent chest pain asking for IV morphine.  Cardiology cleared patient for outpatient follow-up in 1 to 2 weeks, and also suggested avoiding narcotics and referral to pain clinic.  Patient refused to participate with therapy.  The above findings and plan discussed with patient son over the phone.  See individual problem list below for more on hospital course.  Discharge Diagnoses:  Atypical chest pain.  His chest pain is somewhat atypical but new TWI on EKG. Mild troponin leak but flat.   TTE with an EF of 45 to 50% (unchanged) and RWMA. Lexiscan negative.  Cleared for discharge by cardiology with outpatient follow-up in 1 to 2 weeks.  -Could benefit from referral to pain clinic  History of CAD/CABG x2 in 2002 s/p multiple stents with recent [place] in 02/2020 reported as two-vessel CAD with patent stents in proximal mid and distal LAD, proximal left circumflex, 70% stenosis OM1,atretic LIMA to LAD, patent SVG to OM1 with patent stent ostium SVG.  He is on lisinopril, Ranexa, statin and aspirin.  TTE and Lexiscan as above. -Continue home medications -Outpatient follow-up with cardiology in 1 to 2 weeks  Chronic systolic CHF: TTE in 02/8269 with EF of 45 to 50%, indeterminate DD.   He is on Imdur, lisinopril, Farxiga and torsemide at home.He appears compensated.  -Continue home medications  Paroxysmal A. fib: Rate controlled.  On  sotalol and Eliquis at home. -Continue home medications.  Essential hypertension: Normotensive. -Continue cardiac meds as above  Uncontrolled NIDDM-2 with hyperglycemia: A1c 9.6%.  On Farxiga and Metformin at home. 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 -Continue home LAMA/LABA/ICS -Continue home  oxygen  Anxiety/depression/insomnia: Stable. -Continue home Effexor, trazodone and Xanax.  Chronic pain syndrome:  -May benefit from outpatient referral to pain clinic -Continue home Percocet.  History of gout without acute flareup:  -Continue home allopurinol  Class I obesity Body mass index is 33.91 kg/m.            Discharge Exam: Vitals:   09/28/20 0822 09/28/20 1233  BP: (!) 113/59 118/71  Pulse: (!) 57 62  Resp:  14  Temp: 97.8 F (36.6 C) (!) 97.4 F (36.3 C)  SpO2: 96% 96%    GENERAL: No apparent distress.  Nontoxic. HEENT: MMM.  Vision and hearing grossly intact.  NECK: Supple.  No apparent JVD.  RESP:  No IWOB.  Fair aeration bilaterally. CVS:  RRR. Heart sounds normal.  ABD/GI/GU: Bowel sounds present. Soft. Non tender.  MSK/EXT:  Moves extremities. No apparent deformity. No edema.  SKIN: no apparent skin lesion or wound NEURO: Awake, alert and oriented appropriately.  No apparent focal neuro deficit. PSYCH: Calm. Normal affect.  Discharge Instructions  Discharge Instructions    Diet - low sodium heart healthy   Complete by: As directed    Diet Carb Modified   Complete by: As directed    Discharge instructions   Complete by: As directed    It has been a pleasure taking care of you!  You were hospitalized due to chest pain.  After the test is we have done, it is unlikely that your chest pain is related to your heart or your lung.  Please follow-up with your cardiologist in 1 to 2 weeks. Consider discussing referral to pain clinic with your primary care doctor.   Take care,   Increase activity slowly   Complete by: As directed      Allergies as of 09/28/2020      Reactions   Demerol  [meperidine Hcl]    Demerol [meperidine] Hives   Jardiance [empagliflozin] Other (See Comments)   Perineal pain   Prednisone Other (See Comments), Hypertension   Pt states that this medication puts him in A-fib    Sulfa Antibiotics Hives   Albuterol  Sulfate [albuterol] Palpitations, Other (See Comments)   Pt currently uses this medication.     Morphine Sulfate Nausea And Vomiting, Rash, Other (See Comments)   Pt states that he is only allergic to the tablet form of this medication.        Medication List    TAKE these medications   albuterol 108 (90 Base) MCG/ACT inhaler Commonly known as: VENTOLIN HFA INHALE 2 PUFFS BY MOUTH EVERY 6 HOURS AS NEEDED FOR SHORTNESS OF BREATH What changed:   how much to take  how to take this  when to take this  reasons to take this  additional instructions   allopurinol 300 MG tablet Commonly known as: ZYLOPRIM TAKE 1 TABLET BY MOUTH TWICE A DAY   ALPRAZolam 1 MG tablet Commonly known as: XANAX TAKE ONE TABLET BY MOUTH THREE TIMES A DAY   amLODipine 2.5 MG tablet Commonly known as: NORVASC Take 1 tablet (2.5 mg total) by mouth daily.   aspirin 81 MG chewable tablet Chew 81 mg by mouth daily.   atorvastatin 80 MG  tablet Commonly known as: LIPITOR TAKE ONE TABLET BY MOUTH AT BEDTIME   Breo Ellipta 100-25 MCG/INH Aepb Generic drug: fluticasone furoate-vilanterol INHALE 1 PUFF INTO THE LUNGS DAILY What changed: when to take this   budesonide 0.5 MG/2ML nebulizer solution Commonly known as: PULMICORT Take 0.5 mg by nebulization 2 (two) times daily as needed (shortness of breath or wheezing).   cetirizine 10 MG tablet Commonly known as: ZYRTEC Take 1 tablet (10 mg total) by mouth at bedtime.   docusate sodium 100 MG capsule Commonly known as: COLACE Take 100 mg by mouth daily.   Eliquis 5 MG Tabs tablet Generic drug: apixaban TAKE 1 TABLET BY MOUTH TWICE A DAY   Farxiga 10 MG Tabs tablet Generic drug: dapagliflozin propanediol TAKE 1 TABLET BY MOUTH DAILY What changed: how much to take   isosorbide mononitrate 120 MG 24 hr tablet Commonly known as: IMDUR TAKE 1 TABLET BY MOUTH DAILY   Linzess 72 MCG capsule Generic drug: linaclotide Take 72 mcg by mouth daily  before breakfast.   lisinopril 10 MG tablet Commonly known as: ZESTRIL TAKE 1 TABLET BY MOUTH DAILY   magnesium oxide 400 (241.3 Mg) MG tablet Commonly known as: MAG-OX Take 1 tablet (400 mg total) by mouth every morning.   metaxalone 800 MG tablet Commonly known as: SKELAXIN Take 1 tablet (800 mg total) by mouth 3 (three) times daily as needed (pain).   metFORMIN 500 MG 24 hr tablet Commonly known as: GLUCOPHAGE-XR Take 1 tablet (500 mg total) by mouth daily with breakfast.   naloxone 4 MG/0.1ML Liqd nasal [name] kit Commonly known as: NARCAN 1 [name] into nostril x1 and may repeat every 2-3 minutes until patient is responsive or EMS arrives   nitroGLYCERIN 0.4 MG SL tablet Commonly known as: NITROSTAT PLACE ONE TABLET UNDER THE TONGUE EVERY 5 MINUTES FOR CHEST PAIN. IF NO RELIEF PAST 3RD TAB GO TO EMERGENCY ROOM What changed: See the new instructions.   omega-3 acid ethyl esters 1 g capsule Commonly known as: LOVAZA TAKE 4 CAPSULES BY MOUTH DAILY   omeprazole 40 MG capsule Commonly known as: PRILOSEC Take 40 mg by mouth daily.   oxyCODONE-acetaminophen 10-325 MG tablet Commonly known as: PERCOCET TAKE 1 TABLET BY MOUTH EVERY 4 HOURS AS NEEDED FOR PAIN What changed:   reasons to take this  additional instructions   ranolazine 1000 MG SR tablet Commonly known as: RANEXA TAKE 1 TABLET BY MOUTH TWICE A DAY   sotalol 80 MG tablet Commonly known as: BETAPACE Take 80 mg by mouth 2 (two) times daily.   tiotropium 18 MCG inhalation capsule Commonly known as: SPIRIVA Place 18 mcg into inhaler and inhale daily.   torsemide 100 MG tablet Commonly known as: DEMADEX Take 0.5 tablets (50 mg total) by mouth daily.   traZODone 150 MG tablet Commonly known as: DESYREL TAKE ONE TABLET BY MOUTH AT BEDTIME   venlafaxine XR 75 MG 24 hr capsule Commonly known as: EFFEXOR-XR TAKE 1 CAPSULE BY MOUTH DAILY WITH BREAKFAST What changed: See the new instructions.   Vitamin D3  125 MCG (5000 UT) Caps Take 5,000 Units by mouth daily.       Consultations:  Cardiology  Procedures/Studies:  2D Echo on 09/27/2020 1. Left ventricular ejection fraction, by estimation, is 45 to 50%. The  left ventricle has mildly decreased function. The left ventricle  demonstrates regional wall motion abnormalities (see scoring  diagram/findings for description). The left ventricular  internal cavity size was mildly dilated. Left  ventricular diastolic  parameters were normal.  2. Right ventricular systolic function is normal. The right ventricular  size is normal.  3. Left atrial size was mildly dilated.  4. Right atrial size was mildly dilated.  5. The mitral valve is normal in structure. Trivial mitral valve  regurgitation.  6. The aortic valve is grossly normal. Aortic valve regurgitation is  trivial.    Lexiscan Overall Study Impression Myocardial perfusion is abnormal.  Findings consistent with prior myocardial infarction.  This is an intermediate risk study.  Overall left ventricular systolic function was abnormal.    LV cavity size is mildly enlarged.  Nuclear stress EF:  44%.  The left ventricular ejection fraction is moderately decreased (30-44%).         DG Chest 2 View  Result Date: 09/26/2020 CLINICAL DATA:  Chest pain EXAM: CHEST - 2 VIEW COMPARISON:  08/10/2020 FINDINGS: Post sternotomy changes. No focal opacity or pleural effusion. Stable cardiomediastinal silhouette with aortic atherosclerosis. No pneumothorax. IMPRESSION: No active cardiopulmonary disease. Electronically Signed   By: Donavan Foil M.D.   On: 09/26/2020 18:54   NM Myocar Multi W/Spect W/Wall Motion / EF  Result Date: 09/28/2020  Blood pressure demonstrated a normal response to exercise.  There was no ST segment deviation noted during stress.  No T wave inversion was noted during stress.  This is an intermediate risk study.  Findings consistent with prior myocardial  infarction.  The left ventricular ejection fraction is moderately decreased (30-44%).  Nuclear stress EF: 44%.  Adequate chemical stress Anterior apical septal hypokinesis Await nuclear images   ECHOCARDIOGRAM COMPLETE  Result Date: 09/28/2020    ECHOCARDIOGRAM REPORT   Patient Name:   Lawrence [name] [facility] Date of Exam: 09/28/2020 Medical Rec #:  [id number]       Height:       67.0 in Accession #:    [id number]      Weight:       216.5 lb Date of Birth:  05/05/1956       BSA:          2.092 m Patient Age:    8 years        BP:           114/71 mmHg Patient Gender: M               HR:           61 bpm. Exam Location:  [facility] Procedure: 2D Echo and Intracardiac Opacification Agent Indications:     NSTEMI I21.4  History:         Patient has prior history of Echocardiogram examinations, most                  recent 03/08/2020.  Sonographer:     Arville Go RDCS Referring Phys:  [id number] Val Riles Diagnosing Phys: Yolonda Kida MD  Sonographer Comments: Technically difficult study due to poor echo windows. Image acquisition challenging due to patient body habitus. IMPRESSIONS  1. Left ventricular ejection fraction, by estimation, is 45 to 50%. The left ventricle has mildly decreased function. The left ventricle demonstrates regional wall motion abnormalities (see scoring diagram/findings for description). The left ventricular  internal cavity size was mildly dilated. Left ventricular diastolic parameters were normal.  2. Right ventricular systolic function is normal. The right ventricular size is normal.  3. Left atrial size was mildly dilated.  4. Right atrial size was mildly dilated.  5. The mitral valve is normal in  structure. Trivial mitral valve regurgitation.  6. The aortic valve is grossly normal. Aortic valve regurgitation is trivial. FINDINGS  Left Ventricle: Left ventricular ejection fraction, by estimation, is 45 to 50%. The left ventricle has mildly decreased function. The left ventricle demonstrates  regional wall motion abnormalities. Definity contrast agent was given IV to delineate the left ventricular endocardial borders. The left ventricular internal cavity size was mildly dilated. There is no left ventricular hypertrophy. Left ventricular diastolic parameters were normal.  LV Wall Scoring: The mid anteroseptal segment and apex are hypokinetic. Right Ventricle: The right ventricular size is normal. No increase in right ventricular wall thickness. Right ventricular systolic function is normal. Left Atrium: Left atrial size was mildly dilated. Right Atrium: Right atrial size was mildly dilated. Pericardium: There is no evidence of pericardial effusion. Mitral Valve: The mitral valve is normal in structure. Trivial mitral valve regurgitation. Tricuspid Valve: The tricuspid valve is normal in structure. Tricuspid valve regurgitation is mild. Aortic Valve: The aortic valve is grossly normal. Aortic valve regurgitation is trivial. Aortic valve peak gradient measures 6.2 mmHg. Pulmonic Valve: The pulmonic valve was grossly normal. Pulmonic valve regurgitation is not visualized. Aorta: The aortic root is normal in size and structure. IAS/Shunts: No atrial level shunt detected by color flow Doppler.  LEFT VENTRICLE PLAX 2D LVIDd:         5.08 cm  Diastology LVIDs:         3.59 cm  LV e' medial:    6.64 cm/s LV PW:         1.32 cm  LV E/e' medial:  6.9 LV IVS:        1.15 cm  LV e' lateral:   9.46 cm/s LVOT diam:     1.90 cm  LV E/e' lateral: 4.8 LV SV:         64 LV SV Index:   31 LVOT Area:     2.84 cm  RIGHT VENTRICLE RV Basal diam:  3.66 cm RV S prime:     8.59 cm/s TAPSE (M-mode): 1.8 cm LEFT ATRIUM             Index       RIGHT ATRIUM           Index LA diam:        4.20 cm 2.01 cm/m  RA Area:     18.60 cm LA Vol (A2C):   50.3 ml 24.05 ml/m RA Volume:   58.50 ml  27.97 ml/m LA Vol (A4C):   36.8 ml 17.59 ml/m LA Biplane Vol: 46.4 ml 22.18 ml/m  AORTIC VALVE                PULMONIC VALVE AV Area (Vmax):  2.52 cm    PV Vmax:       0.81 m/s AV Vmax:        124.00 cm/s PV Peak grad:  2.7 mmHg AV Peak Grad:   6.2 mmHg LVOT Vmax:      110.00 cm/s LVOT Vmean:     69.000 cm/s LVOT VTI:       0.227 m  AORTA Ao Root diam: 3.00 cm Ao Asc diam:  3.30 cm MITRAL VALVE MV Area (PHT): 2.71 cm    SHUNTS MV Decel Time: 280 msec    Systemic VTI:  0.23 m MV E velocity: 45.80 cm/s  Systemic Diam: 1.90 cm MV A velocity: 43.30 cm/s MV E/A ratio:  1.06 Yolonda Kida MD Electronically signed by Karma Greaser  Prince Rome MD Signature Date/Time: 09/28/2020/1:28:30 PM    Final        The results of significant diagnostics from this hospitalization (including imaging, microbiology, ancillary and laboratory) are listed below for reference.     Microbiology: Recent Results (from the past 240 hour(s))  Respiratory Panel by RT PCR (Flu A&B, Covid) - Nasopharyngeal Swab     Status: None   Collection Time: 09/26/20  6:57 PM   Specimen: Nasopharyngeal Swab  Result Value Ref Range Status   SARS Coronavirus 2 by RT PCR NEGATIVE NEGATIVE Final    Comment: (NOTE) SARS-CoV-2 target nucleic acids are NOT DETECTED.  The SARS-CoV-2 RNA is generally detectable in upper respiratoy specimens during the acute phase of infection. The lowest concentration of SARS-CoV-2 viral copies this assay can detect is 131 copies/mL. A negative result does not preclude SARS-Cov-2 infection and should not be used as the sole basis for treatment or other patient management decisions. A negative result may occur with  improper specimen collection/handling, submission of specimen other than nasopharyngeal swab, presence of viral mutation(s) within the areas targeted by this assay, and inadequate number of viral copies (<131 copies/mL). A negative result must be combined with clinical observations, patient history, and epidemiological information. The expected result is Negative.  Fact Sheet for Patients:   PinkCheek.be  Fact Sheet for Healthcare Providers:  GravelBags.it  This test is no t yet approved or cleared by the [place] FDA and  has been authorized for detection and/or diagnosis of SARS-CoV-2 by FDA under an Emergency Use Authorization (EUA). This EUA will remain  in effect (meaning this test can be used) for the duration of the COVID-19 declaration under Section 564(b)(1) of the Act, 21 U.S.C. section 360bbb-3(b)(1), unless the authorization is terminated or revoked sooner.     Influenza A by PCR NEGATIVE NEGATIVE Final   Influenza B by PCR NEGATIVE NEGATIVE Final    Comment: (NOTE) The Xpert Xpress SARS-CoV-2/FLU/RSV assay is intended as an aid in  the diagnosis of influenza from Nasopharyngeal swab specimens and  should not be used as a sole basis for treatment. Nasal washings and  aspirates are unacceptable for Xpert Xpress SARS-CoV-2/FLU/RSV  testing.  Fact Sheet for Patients: PinkCheek.be  Fact Sheet for Healthcare Providers: GravelBags.it  This test is not yet approved or cleared by the [place] FDA and  has been authorized for detection and/or diagnosis of SARS-CoV-2 by  FDA under an Emergency Use Authorization (EUA). This EUA will remain  in effect (meaning this test can be used) for the duration of the  Covid-19 declaration under Section 564(b)(1) of the Act, 21  U.S.C. section 360bbb-3(b)(1), unless the authorization is  terminated or revoked. Performed at [facility], [address] [place], [place] [address]      Labs: BNP (last 3 results) Recent Labs    03/07/20 1226  BNP 947.6*   Basic Metabolic Panel: Recent Labs  Lab 09/24/20 1105 09/26/20 1800 09/27/20 0306  NA 139 132* 136  K 5.7* 3.6 3.8  CL 96 93* 98  CO2 28 30 30   GLUCOSE 256* 211* 213*  BUN 8 15 18   CREATININE 0.94 1.24 1.02  CALCIUM 10.8*  9.2 9.2   Liver Function Tests: Recent Labs  Lab 09/24/20 1105  AST 24  ALT 22  ALKPHOS 115  BILITOT 0.5  PROT 7.4  ALBUMIN 4.7   No results for input(s): LIPASE, AMYLASE in the last 168 hours. No results for input(s): AMMONIA in  the last 168 hours. CBC: Recent Labs  Lab 09/24/20 1105 09/26/20 1800 09/27/20 0306 09/28/20 0816  WBC 5.9 9.4 7.0 5.8  HGB 20.5* 19.6* 18.3* 17.6*  HCT 62.2* 58.3* 54.1* 53.6*  MCV 93 93.3 93.4 95.0  PLT 164 205 167 148*   Cardiac Enzymes: No results for input(s): CKTOTAL, CKMB, CKMBINDEX, TROPONINI in the last 168 hours. BNP: Invalid input(s): POCBNP CBG: Recent Labs  Lab 09/27/20 2105 09/28/20 0024 09/28/20 0410 09/28/20 0832 09/28/20 1210  GLUCAP 207* 170* 153* 168* 170*   D-Dimer No results for input(s): DDIMER in the last 72 hours. Hgb A1c No results for input(s): HGBA1C in the last 72 hours. Lipid Profile Recent Labs    09/27/20 0306  CHOL 215*  HDL 29*  LDLCALC UNABLE TO CALCULATE IF TRIGLYCERIDE OVER 400 mg/dL  TRIG 488*  CHOLHDL 7.4  LDLDIRECT 122.1*   Thyroid function studies No results for input(s): TSH, T4TOTAL, T3FREE, THYROIDAB in the last 72 hours.  Invalid input(s): FREET3 Anemia work up No results for input(s): VITAMINB12, FOLATE, FERRITIN, TIBC, IRON, RETICCTPCT in the last 72 hours. Urinalysis    Component Value Date/Time   COLORURINE AMBER (A) 10/03/2019 2002   APPEARANCEUR CLEAR (A) 10/03/2019 2002   APPEARANCEUR Clear 05/25/2019 1554   LABSPEC 1.037 (H) 10/03/2019 2002   LABSPEC 1.012 03/07/2015 2143   PHURINE 5.0 10/03/2019 2002   GLUCOSEU >=500 (A) 10/03/2019 2002   GLUCOSEU Negative 03/07/2015 2143   HGBUR NEGATIVE 10/03/2019 2002   BILIRUBINUR NEGATIVE 10/03/2019 2002   BILIRUBINUR Negative 05/25/2019 1554   BILIRUBINUR Negative 03/07/2015 2143   [place] NEGATIVE 10/03/2019 2002   PROTEINUR 100 (A) 10/03/2019 2002   UROBILINOGEN 0.2 03/26/2017 0907   NITRITE NEGATIVE 10/03/2019 2002    LEUKOCYTESUR NEGATIVE 10/03/2019 2002   LEUKOCYTESUR Negative 03/07/2015 2143   Sepsis Labs Invalid input(s): PROCALCITONIN,  WBC,  LACTICIDVEN   Time coordinating discharge: 40 minutes  SIGNED:  Mercy Riding, MD  [facility] 09/28/2020, 4:15 PM  If 7PM-7AM, please contact night-coverage [email or web address]

## 2020-09-30 ENCOUNTER — Telehealth: Payer: Self-pay

## 2020-09-30 NOTE — Telephone Encounter (Signed)
No HFU scheduled at this time. Next apt in 10/22/20 for a 3-4 week f/u on HTN.

## 2020-09-30 NOTE — Telephone Encounter (Signed)
Transition Care Management Unsuccessful Follow-up Telephone Call  Date of discharge and from where:  [facility] on 09/28/20/  Attempts:  1st Attempt  Reason for unsuccessful TCM follow-up call:  Left voice message

## 2020-09-30 NOTE — Telephone Encounter (Signed)
Patient called back for Lawrence [name] can be reached at Ph# [phone]

## 2020-10-01 NOTE — Telephone Encounter (Signed)
Transition Care Management Follow-up Telephone Call  Date of discharge and from where: [facility] on 09/28/20  How have you been since you were released from the hospital? Pt states he has been doing "fair" as he still feels light headed occasionally. Pt is unsure of the cause and does not know what triggers these episodes. Pt is still having chest pain. Pt states the pain is no better or worse. Current pain score is a 7. Pt takes Oxycodone for pain but it is not helping the chest pain, only helps his back pain. Pt's cough is better and he has not had coughed since yesterday. Pt has had no further issues with SOB. Pt's appetite is ok and he is sleeping fine. Declines fever or n/v/d.  Any questions or concerns? No   Items Reviewed:  Did the pt receive and understand the discharge instructions provided? Yes   Medications obtained and verified? No, pt declined reviewing these at this time.   Any new allergies since your discharge? No   Dietary orders reviewed? Yes  Do you have support at home? Yes   Other (ie: DME, Home Health, etc): N/A  Functional Questionnaire: (I = Independent and D = Dependent)  Bathing/Dressing- I   Meal Prep- I  Eating- I  Maintaining continence- I, has occasional urine leakage.  Transferring/Ambulation- I  Managing Meds- I   Follow up appointments reviewed:    PCP Hospital f/u appt confirmed? No  , pt declined scheduling a HFU with PCP at this time. Pt to follow up with oncology and cardiology first and will CB if he feels he needs to see PCP. Next apt with PCP is 10/22/20 for a 3-4 week HTN f/u.  [facility] f/u appt confirmed? Yes    Are transportation arrangements needed? No   If their condition worsens, is the pt aware to call  their PCP or go to the ED? Yes  Was the patient provided with contact information for the PCP's office or ED? Yes  Was the pt encouraged to call back with questions or concerns? Yes

## 2020-10-02 DIAGNOSIS — I25118 Atherosclerotic heart disease of native coronary artery with other forms of angina pectoris: Secondary | ICD-10-CM | POA: Diagnosis not present

## 2020-10-02 DIAGNOSIS — Z720 Tobacco use: Secondary | ICD-10-CM | POA: Diagnosis not present

## 2020-10-02 DIAGNOSIS — J418 Mixed simple and mucopurulent chronic bronchitis: Secondary | ICD-10-CM | POA: Diagnosis not present

## 2020-10-02 DIAGNOSIS — E119 Type 2 diabetes mellitus without complications: Secondary | ICD-10-CM | POA: Diagnosis not present

## 2020-10-02 DIAGNOSIS — I10 Essential (primary) hypertension: Secondary | ICD-10-CM | POA: Diagnosis not present

## 2020-10-02 DIAGNOSIS — N182 Chronic kidney disease, stage 2 (mild): Secondary | ICD-10-CM | POA: Diagnosis not present

## 2020-10-02 DIAGNOSIS — I480 Paroxysmal atrial fibrillation: Secondary | ICD-10-CM | POA: Diagnosis not present

## 2020-10-02 DIAGNOSIS — E782 Mixed hyperlipidemia: Secondary | ICD-10-CM | POA: Diagnosis not present

## 2020-10-02 DIAGNOSIS — I214 Non-ST elevation (NSTEMI) myocardial infarction: Secondary | ICD-10-CM | POA: Diagnosis not present

## 2020-10-05 DIAGNOSIS — D751 Secondary polycythemia: Secondary | ICD-10-CM | POA: Insufficient documentation

## 2020-10-05 NOTE — Progress Notes (Deleted)
Lawrence [name]  [phone] [phone]  ID: Lawrence [name] OB: Mar 12, 1956  MR#: [id number]  SWN#:[id number]  Patient Care Team: Birdie Sons, MD as PCP - General (Family Medicine) Allyne Gee, MD as Consulting Physician (Pulmonary Disease) Rodney Booze as Physician Assistant Anell Barr, OD as Consulting Physician (Optometry)  CHIEF COMPLAINT: Polycythemia.  INTERVAL HISTORY: ***  REVIEW OF SYSTEMS:   ROS  As per HPI. Otherwise, a complete review of systems is negative.  PAST MEDICAL HISTORY: Past Medical History:  Diagnosis Date  . A-fib ([place])   . Anemia   . Anginal pain ([place])   . Anxiety   . Arthritis   . Asthma   . CAD (coronary artery disease)    a. 2002 CABGx2 (LIMA->LAD, VG->VG->OM1);  b. 09/2012 DES->OM;  c. 03/2015 PTCA of LAD [facility]) in setting of atretic LIMA; d. 05/2015 Cath [facility]): nonobs dzs; e. 06/2015 Cath ([facility]): LM nl, LAD 45p/d ISR, 50d, D1/2 small, LCX 50p/d ISR, OM1 70ost, 30 ISR, VG->OM1 50ost, 12m LIMA->LAD 99p/d - atretic, RCA dom, nl; f.cath 10/16: 40-50%(FFR 0.90) pLAD, 75% (FFR 0.77) mLAD s/p PCI/DES, oRCA 40% (FFR0.95)  . Cancer ([place]    SKIN CANCER ON BACK  . Celiac disease   . Chronic diastolic CHF (congestive heart failure) ([place]    a. 06/2009 Echo: EF 60-65%, Gr 1 DD, triv AI, mildly dil LA, nl RV.  .Marland KitchenCOPD (chronic obstructive pulmonary disease) ([place]    a. Chronic bronchitis and emphysema.  . DDD (degenerative disc disease), lumbar   . Diverticulosis   . Dysrhythmia   . Essential hypertension   . GERD (gastroesophageal reflux disease)   . History of hiatal hernia   . History of kidney stones    H/O  . History of tobacco abuse    a. Quit 2014.  .Marland KitchenMyocardial infarction ([place] 2002   4 STENTS  . Pancreatitis   . PSVT (paroxysmal supraventricular tachycardia) ([place]    a. 10/2012 Noted on Zio Patch.  . Sleep apnea    LOST CORD TO CPAP -ONLY 02 @ BEDTIME  . Tubular adenoma of colon    . Type II diabetes mellitus ([place]     PAST SURGICAL HISTORY: Past Surgical History:  Procedure Laterality Date  . BYPASS GRAFT    . CARDIAC CATHETERIZATION N/A 07/12/2015   rocedure: Left Heart Cath and Cors/Grafts Angiography;  Surgeon: HBelva Crome MD;  [facility];  Service: Cardiovascular;  Laterality: N/A;  . CARDIAC CATHETERIZATION Right 10/07/2015   Procedure: Left Heart Cath and Cors/Grafts Angiography;  Surgeon: SDionisio David MD;  [facility];  Service: Cardiovascular;  Laterality: Right;  . CARDIAC CATHETERIZATION N/A 04/06/2016   Procedure: Left Heart Cath and Coronary Angiography;  Surgeon: DYolonda Kida MD;  [facility];  Service: Cardiovascular;  Laterality: N/A;  . CARDIAC CATHETERIZATION  04/06/2016   Procedure: Bypass Graft Angiography;  Surgeon: DYolonda Kida MD;  [place] [facility];  Service: Cardiovascular;;  . CARDIAC CATHETERIZATION N/A 11/02/2016   Procedure: Left Heart Cath and Cors/Grafts Angiography and possible PCI;  Surgeon: DYolonda Kida MD;  [facility];  Service: Cardiovascular;  Laterality: N/A;  . CARDIAC CATHETERIZATION N/A 11/02/2016   Procedure: Coronary Stent Intervention;  Surgeon: DYolonda Kida MD;  [facility];  Service: Cardiovascular;  Laterality: N/A;  . CHOLECYSTECTOMY    . CIRCUMCISION N/A 06/09/2019  Procedure: CIRCUMCISION ADULT;  Surgeon: Billey Co, MD;  Location: [facility] ORS;  Service: Urology;  Laterality: N/A;  . COLONOSCOPY WITH PROPOFOL N/A 04/01/2018   Procedure: COLONOSCOPY WITH PROPOFOL;  Surgeon: Manya Silvas, MD;  Location[facility] ENDOSCOPY;  Service: Endoscopy;  Laterality: N/A;  . ESOPHAGEAL DILATION    . ESOPHAGOGASTRODUODENOSCOPY (EGD) WITH PROPOFOL N/A 04/01/2018   Procedure: ESOPHAGOGASTRODUODENOSCOPY (EGD) WITH PROPOFOL;  Surgeon: Manya Silvas, MD;  Location: [name] ENDOSCOPY;  Service:  Endoscopy;  Laterality: N/A;  . LEFT HEART CATH AND CORS/GRAFTS ANGIOGRAPHY N/A 06/12/2019   Procedure: LEFT HEART CATH AND CORS/GRAFTS ANGIOGRAPHY;  Surgeon: Teodoro Spray, MD;  Location: [place] CV LAB;  Service: Cardiovascular;  Laterality: N/A;  . LEFT HEART CATH AND CORS/GRAFTS ANGIOGRAPHY N/A 03/11/2020   Procedure: LEFT HEART CATH AND CORS/GRAFTS ANGIOGRAPHY;  Surgeon: Isaias Cowman, MD;  Location: [place] CV LAB;  Service: Cardiovascular;  Laterality: N/A;  . TONSILLECTOMY    . VASCULAR SURGERY      FAMILY HISTORY: Family History  Problem Relation Age of Onset  . Heart attack Mother   . Depression Mother   . Heart disease Mother   . COPD Mother   . Hypertension Mother   . Heart attack Father   . Diabetes Father   . Depression Father   . Heart disease Father   . Cirrhosis Father   . Parkinson's disease Brother     ADVANCED DIRECTIVES (Y/N):  N  HEALTH MAINTENANCE: Social History   Tobacco Use  . Smoking status: Former Smoker    Packs/day: 3.00    Years: 50.00    Pack years: 150.00    Types: Cigarettes    Quit date: 04/22/2013    Years since quitting: 7.4  . Smokeless tobacco: Never Used  Vaping Use  . Vaping Use: Never used  Substance Use Topics  . Alcohol use: No    Comment: remotely quit alcohol use. Hx of heavy alcohol use.  . Drug use: Yes    Types: Marijuana    Comment: occasionally     Colonoscopy:  PAP:  Bone density:  Lipid panel:  Allergies  Allergen Reactions  . Demerol  [Meperidine Hcl]   . Demerol [Meperidine] Hives  . Jardiance [Empagliflozin] Other (See Comments)    Perineal pain  . Prednisone Other (See Comments) and Hypertension    Pt states that this medication puts him in A-fib   . Sulfa Antibiotics Hives  . Albuterol Sulfate [Albuterol] Palpitations and Other (See Comments)    Pt currently uses this medication.    . Morphine Sulfate Nausea And Vomiting, Rash and Other (See Comments)    Pt states that he is  only allergic to the tablet form of this medication.      Current Outpatient Medications  Medication Sig Dispense Refill  . albuterol (VENTOLIN HFA) 108 (90 Base) MCG/ACT inhaler INHALE 2 PUFFS BY MOUTH EVERY 6 HOURS AS NEEDED FOR SHORTNESS OF BREATH (Patient taking differently: Inhale 2 puffs into the lungs every 6 (six) hours as needed for wheezing or shortness of breath. ) 18 g 5  . allopurinol (ZYLOPRIM) 300 MG tablet TAKE 1 TABLET BY MOUTH TWICE A DAY (Patient taking differently: Take 300 mg by mouth 2 (two) times daily. ) 180 tablet 0  . ALPRAZolam (XANAX) 1 MG tablet TAKE ONE TABLET BY MOUTH THREE TIMES A DAY (Patient taking differently: Take 1 mg by mouth 3 (three) times daily. ) 90 tablet 3  . amLODipine (NORVASC) 2.5  MG tablet Take 1 tablet (2.5 mg total) by mouth daily. 90 tablet 3  . aspirin 81 MG chewable tablet Chew 81 mg by mouth daily.    Marland Kitchen atorvastatin (LIPITOR) 80 MG tablet TAKE ONE TABLET BY MOUTH AT BEDTIME (Patient taking differently: Take 80 mg by mouth at bedtime. ) 90 tablet 1  . BREO ELLIPTA 100-25 MCG/INH AEPB INHALE 1 PUFF INTO THE LUNGS DAILY (Patient taking differently: Inhale 1 puff into the lungs daily. ) 60 each 5  . budesonide (PULMICORT) 0.5 MG/2ML nebulizer solution Take 0.5 mg by nebulization 2 (two) times daily as needed (shortness of breath or wheezing).     . cetirizine (ZYRTEC) 10 MG tablet Take 1 tablet (10 mg total) by mouth at bedtime. 90 tablet 1  . Cholecalciferol (VITAMIN D3) 125 MCG (5000 UT) CAPS Take 5,000 Units by mouth daily.     Marland Kitchen docusate sodium (COLACE) 100 MG capsule Take 100 mg by mouth daily.     Marland Kitchen ELIQUIS 5 MG TABS tablet TAKE 1 TABLET BY MOUTH TWICE A DAY 180 tablet 3  . FARXIGA 10 MG TABS tablet TAKE 1 TABLET BY MOUTH DAILY (Patient taking differently: Take 10 mg by mouth daily. ) 30 tablet 12  . isosorbide mononitrate (IMDUR) 120 MG 24 hr tablet TAKE 1 TABLET BY MOUTH DAILY (Patient taking differently: Take 120 mg by mouth daily. ) 90  tablet 0  . LINZESS 72 MCG capsule Take 72 mcg by mouth daily before breakfast.     . lisinopril (ZESTRIL) 10 MG tablet TAKE 1 TABLET BY MOUTH DAILY (Patient taking differently: Take 10 mg by mouth daily. ) 90 tablet 4  . magnesium oxide (MAG-OX) 400 (241.3 Mg) MG tablet Take 1 tablet (400 mg total) by mouth every morning. 30 tablet 5  . metaxalone (SKELAXIN) 800 MG tablet Take 1 tablet (800 mg total) by mouth 3 (three) times daily as needed (pain). 30 tablet 3  . metFORMIN (GLUCOPHAGE-XR) 500 MG 24 hr tablet Take 1 tablet (500 mg total) by mouth daily with breakfast. 30 tablet 2  . naloxone (NARCAN) nasal spray 4 mg/0.1 mL 1 spray into nostril x1 and may repeat every 2-3 minutes until patient is responsive or EMS arrives 2 each 2  . nitroGLYCERIN (NITROSTAT) 0.4 MG SL tablet PLACE ONE TABLET UNDER THE TONGUE EVERY 5 MINUTES FOR CHEST PAIN. IF NO RELIEF PAST 3RD TAB GO TO EMERGENCY ROOM (Patient taking differently: Place 0.4 mg under the tongue every 5 (five) minutes as needed for chest pain. ) 30 tablet 3  . omega-3 acid ethyl esters (LOVAZA) 1 g capsule TAKE 4 CAPSULES BY MOUTH DAILY (Patient taking differently: Take 4 g by mouth daily. ) 120 capsule 12  . omeprazole (PRILOSEC) 40 MG capsule Take 40 mg by mouth daily.     Marland Kitchen oxyCODONE-acetaminophen (PERCOCET) 10-325 MG tablet TAKE 1 TABLET BY MOUTH EVERY 4 HOURS AS NEEDED FOR PAIN (Patient taking differently: Take 1 tablet by mouth every 4 (four) hours as needed for pain. ) 150 tablet 0  . ranolazine (RANEXA) 1000 MG SR tablet TAKE 1 TABLET BY MOUTH TWICE A DAY (Patient taking differently: Take 1,000 mg by mouth 2 (two) times daily. ) 60 tablet 5  . sotalol (BETAPACE) 80 MG tablet Take 80 mg by mouth 2 (two) times daily.    Marland Kitchen tiotropium (SPIRIVA) 18 MCG inhalation capsule Place 18 mcg into inhaler and inhale daily.    Marland Kitchen torsemide (DEMADEX) 100 MG tablet Take 0.5 tablets (  50 mg total) by mouth daily. 30 tablet 3  . traZODone (DESYREL) 150 MG tablet  TAKE ONE TABLET BY MOUTH AT BEDTIME (Patient taking differently: Take 150 mg by mouth at bedtime. ) 30 tablet 1  . venlafaxine XR (EFFEXOR-XR) 75 MG 24 hr capsule TAKE 1 CAPSULE BY MOUTH DAILY WITH BREAKFAST (Patient taking differently: Take 75 mg by mouth daily with breakfast. ) 90 capsule 4   No current facility-administered medications for this visit.    OBJECTIVE: There were no vitals filed for this visit.   There is no height or weight on file to calculate BMI.    ECOG FS:{CHL ONC [id number]  General: Well-developed, well-nourished, no acute distress. Eyes: Pink conjunctiva, anicteric sclera. HEENT: Normocephalic, moist mucous membranes. Lungs: No audible wheezing or coughing. Heart: Regular rate and rhythm. Abdomen: Soft, nontender, no obvious distention. Musculoskeletal: No edema, cyanosis, or clubbing. Neuro: Alert, answering all questions appropriately. Cranial nerves grossly intact. Skin: No rashes or petechiae noted. Psych: Normal affect. Lymphatics: No cervical, calvicular, axillary or inguinal LAD.   LAB RESULTS:  Lab Results  Component Value Date   NA 136 09/27/2020   K 3.8 09/27/2020   CL 98 09/27/2020   CO2 30 09/27/2020   GLUCOSE 213 (H) 09/27/2020   BUN 18 09/27/2020   CREATININE 1.02 09/27/2020   CALCIUM 9.2 09/27/2020   PROT 7.4 09/24/2020   ALBUMIN 4.7 09/24/2020   AST 24 09/24/2020   ALT 22 09/24/2020   ALKPHOS 115 09/24/2020   BILITOT 0.5 09/24/2020   GFRNONAA >60 09/27/2020   GFRAA 97 09/24/2020    Lab Results  Component Value Date   WBC 5.8 09/28/2020   NEUTROABS 4.5 09/08/2019   HGB 17.6 (H) 09/28/2020   HCT 53.6 (H) 09/28/2020   MCV 95.0 09/28/2020   PLT 148 (L) 09/28/2020     STUDIES: DG Chest 2 View  Result Date: 09/26/2020 CLINICAL DATA:  Chest pain EXAM: CHEST - 2 VIEW COMPARISON:  08/10/2020 FINDINGS: Post sternotomy changes. No focal opacity or pleural effusion. Stable cardiomediastinal silhouette with aortic  atherosclerosis. No pneumothorax. IMPRESSION: No active cardiopulmonary disease. Electronically Signed   By: Donavan Foil M.D.   On: 09/26/2020 18:54   NM Myocar Multi W/Spect W/Wall Motion / EF  Result Date: 09/28/2020  Blood pressure demonstrated a normal response to exercise.  There was no ST segment deviation noted during stress.  No T wave inversion was noted during stress.  This is an intermediate risk study.  Findings consistent with prior myocardial infarction.  The left ventricular ejection fraction is moderately decreased (30-44%).  Nuclear stress EF: 44%.  Adequate chemical stress Anterior apical septal hypokinesis Await nuclear images   ECHOCARDIOGRAM COMPLETE  Result Date: 09/28/2020    ECHOCARDIOGRAM REPORT   Patient Name:   Lawrence [name] Date of Exam: 09/28/2020 Medical Rec #:  [id number]       Height:       67.0 in Accession #:    [id number]      Weight:       216.5 lb Date of Birth:  02-May-1956       BSA:          2.092 m Patient Age:    64 years        BP:           114/71 mmHg Patient Gender: M               HR:  61 bpm. Exam Location:  [facility] Procedure: 2D Echo and Intracardiac Opacification Agent Indications:     NSTEMI I21.4  History:         Patient has prior history of Echocardiogram examinations, most                  recent 03/08/2020.  Sonographer:     Arville Go RDCS Referring Phys:  [id number] Val Riles Diagnosing Phys: Yolonda Kida MD  Sonographer Comments: Technically difficult study due to poor echo windows. Image acquisition challenging due to patient body habitus. IMPRESSIONS  1. Left ventricular ejection fraction, by estimation, is 45 to 50%. The left ventricle has mildly decreased function. The left ventricle demonstrates regional wall motion abnormalities (see scoring diagram/findings for description). The left ventricular  internal cavity size was mildly dilated. Left ventricular diastolic parameters were normal.  2. Right ventricular systolic  function is normal. The right ventricular size is normal.  3. Left atrial size was mildly dilated.  4. Right atrial size was mildly dilated.  5. The mitral valve is normal in structure. Trivial mitral valve regurgitation.  6. The aortic valve is grossly normal. Aortic valve regurgitation is trivial. FINDINGS  Left Ventricle: Left ventricular ejection fraction, by estimation, is 45 to 50%. The left ventricle has mildly decreased function. The left ventricle demonstrates regional wall motion abnormalities. Definity contrast agent was given IV to delineate the left ventricular endocardial borders. The left ventricular internal cavity size was mildly dilated. There is no left ventricular hypertrophy. Left ventricular diastolic parameters were normal.  LV Wall Scoring: The mid anteroseptal segment and apex are hypokinetic. Right Ventricle: The right ventricular size is normal. No increase in right ventricular wall thickness. Right ventricular systolic function is normal. Left Atrium: Left atrial size was mildly dilated. Right Atrium: Right atrial size was mildly dilated. Pericardium: There is no evidence of pericardial effusion. Mitral Valve: The mitral valve is normal in structure. Trivial mitral valve regurgitation. Tricuspid Valve: The tricuspid valve is normal in structure. Tricuspid valve regurgitation is mild. Aortic Valve: The aortic valve is grossly normal. Aortic valve regurgitation is trivial. Aortic valve peak gradient measures 6.2 mmHg. Pulmonic Valve: The pulmonic valve was grossly normal. Pulmonic valve regurgitation is not visualized. Aorta: The aortic root is normal in size and structure. IAS/Shunts: No atrial level shunt detected by color flow Doppler.  LEFT VENTRICLE PLAX 2D LVIDd:         5.08 cm  Diastology LVIDs:         3.59 cm  LV e' medial:    6.64 cm/s LV PW:         1.32 cm  LV E/e' medial:  6.9 LV IVS:        1.15 cm  LV e' lateral:   9.46 cm/s LVOT diam:     1.90 cm  LV E/e' lateral: 4.8 LV  SV:         64 LV SV Index:   31 LVOT Area:     2.84 cm  RIGHT VENTRICLE RV Basal diam:  3.66 cm RV S prime:     8.59 cm/s TAPSE (M-mode): 1.8 cm LEFT ATRIUM             Index       RIGHT ATRIUM           Index LA diam:        4.20 cm 2.01 cm/m  RA Area:     18.60 cm LA Vol (A2C):  50.3 ml 24.05 ml/m RA Volume:   58.50 ml  27.97 ml/m LA Vol (A4C):   36.8 ml 17.59 ml/m LA Biplane Vol: 46.4 ml 22.18 ml/m  AORTIC VALVE                PULMONIC VALVE AV Area (Vmax): 2.52 cm    PV Vmax:       0.81 m/s AV Vmax:        124.00 cm/s PV Peak grad:  2.7 mmHg AV Peak Grad:   6.2 mmHg LVOT Vmax:      110.00 cm/s LVOT Vmean:     69.000 cm/s LVOT VTI:       0.227 m  AORTA Ao Root diam: 3.00 cm Ao Asc diam:  3.30 cm MITRAL VALVE MV Area (PHT): 2.71 cm    SHUNTS MV Decel Time: 280 msec    Systemic VTI:  0.23 m MV E velocity: 45.80 cm/s  Systemic Diam: 1.90 cm MV A velocity: 43.30 cm/s MV E/A ratio:  1.06 Lawrence D Callwood MD Electronically signed by Yolonda Kida MD Signature Date/Time: 09/28/2020/1:28:30 PM    Final     ASSESSMENT: Polycythemia  PLAN:    1. Polycythemia:  Patient expressed understanding and was in agreement with this plan. He also understands that He can call clinic at any time with any questions, concerns, or complaints.   Cancer Staging No matching staging information was found for the patient.  Lloyd Huger, MD   10/05/2020 6:34 PM

## 2020-10-06 ENCOUNTER — Other Ambulatory Visit: Payer: Self-pay

## 2020-10-06 ENCOUNTER — Emergency Department: Payer: Medicare HMO

## 2020-10-06 ENCOUNTER — Observation Stay
Admission: EM | Admit: 2020-10-06 | Discharge: 2020-10-08 | Disposition: A | Discharge status: Home / Self Care | Payer: Medicare HMO | Attending: Internal Medicine | Admitting: Internal Medicine

## 2020-10-06 DIAGNOSIS — Z7982 Long term (current) use of aspirin: Secondary | ICD-10-CM | POA: Insufficient documentation

## 2020-10-06 DIAGNOSIS — I10 Essential (primary) hypertension: Secondary | ICD-10-CM | POA: Diagnosis not present

## 2020-10-06 DIAGNOSIS — I5032 Chronic diastolic (congestive) heart failure: Secondary | ICD-10-CM | POA: Insufficient documentation

## 2020-10-06 DIAGNOSIS — R079 Chest pain, unspecified: Principal | ICD-10-CM | POA: Diagnosis present

## 2020-10-06 DIAGNOSIS — N182 Chronic kidney disease, stage 2 (mild): Secondary | ICD-10-CM | POA: Diagnosis not present

## 2020-10-06 DIAGNOSIS — J449 Chronic obstructive pulmonary disease, unspecified: Secondary | ICD-10-CM | POA: Insufficient documentation

## 2020-10-06 DIAGNOSIS — R0789 Other chest pain: Secondary | ICD-10-CM | POA: Diagnosis not present

## 2020-10-06 DIAGNOSIS — Z87891 Personal history of nicotine dependence: Secondary | ICD-10-CM | POA: Insufficient documentation

## 2020-10-06 DIAGNOSIS — Z79899 Other long term (current) drug therapy: Secondary | ICD-10-CM | POA: Diagnosis not present

## 2020-10-06 DIAGNOSIS — Z7901 Long term (current) use of anticoagulants: Secondary | ICD-10-CM | POA: Insufficient documentation

## 2020-10-06 DIAGNOSIS — Z7984 Long term (current) use of oral hypoglycemic drugs: Secondary | ICD-10-CM | POA: Diagnosis not present

## 2020-10-06 DIAGNOSIS — R0902 Hypoxemia: Secondary | ICD-10-CM | POA: Diagnosis not present

## 2020-10-06 DIAGNOSIS — I25119 Atherosclerotic heart disease of native coronary artery with unspecified angina pectoris: Secondary | ICD-10-CM | POA: Insufficient documentation

## 2020-10-06 DIAGNOSIS — Z20822 Contact with and (suspected) exposure to covid-19: Secondary | ICD-10-CM | POA: Insufficient documentation

## 2020-10-06 DIAGNOSIS — E1122 Type 2 diabetes mellitus with diabetic chronic kidney disease: Secondary | ICD-10-CM | POA: Diagnosis not present

## 2020-10-06 DIAGNOSIS — J45909 Unspecified asthma, uncomplicated: Secondary | ICD-10-CM | POA: Diagnosis not present

## 2020-10-06 DIAGNOSIS — I130 Hypertensive heart and chronic kidney disease with heart failure and stage 1 through stage 4 chronic kidney disease, or unspecified chronic kidney disease: Secondary | ICD-10-CM | POA: Insufficient documentation

## 2020-10-06 LAB — HEPARIN LEVEL (UNFRACTIONATED): Heparin Unfractionated: 2.16 IU/mL — ABNORMAL HIGH (ref 0.30–0.70)

## 2020-10-06 LAB — BASIC METABOLIC PANEL
Anion gap: 8 (ref 5–15)
BUN: 9 mg/dL (ref 8–23)
CO2: 31 mmol/L (ref 22–32)
Calcium: 8.8 mg/dL — ABNORMAL LOW (ref 8.9–10.3)
Chloride: 100 mmol/L (ref 98–111)
Creatinine, Ser: 0.83 mg/dL (ref 0.61–1.24)
GFR, Estimated: >60 mL/min (ref >60)
Glucose, Bld: 124 mg/dL — ABNORMAL HIGH (ref 70–99)
Potassium: 4 mmol/L (ref 3.5–5.1)
Sodium: 139 mmol/L (ref 135–145)

## 2020-10-06 LAB — TROPONIN I (HIGH SENSITIVITY)
Troponin I (High Sensitivity): 12 ng/L (ref <18)
Troponin I (High Sensitivity): 13 ng/L (ref <18)
Troponin I (High Sensitivity): 12 ng/L (ref <18)
Troponin I (High Sensitivity): 13 ng/L (ref <18)

## 2020-10-06 LAB — CBC
HCT: 55.8 % — ABNORMAL HIGH (ref 39.0–52.0)
Hemoglobin: 18.9 g/dL — ABNORMAL HIGH (ref 13.0–17.0)
MCH: 31.9 pg (ref 26.0–34.0)
MCHC: 33.9 g/dL (ref 30.0–36.0)
MCV: 94.1 fL (ref 80.0–100.0)
Platelets: 173 10*3/uL (ref 150–400)
RBC: 5.93 MIL/uL — ABNORMAL HIGH (ref 4.22–5.81)
RDW: 13.2 % (ref 11.5–15.5)
WBC: 5.9 10*3/uL (ref 4.0–10.5)
nRBC: 0 % (ref 0.0–0.2)

## 2020-10-06 LAB — RESP PANEL BY RT-PCR (FLU A&B, COVID) ARPGX2
Influenza A by PCR: NEGATIVE
Influenza B by PCR: NEGATIVE
SARS Coronavirus 2 by RT PCR: NEGATIVE

## 2020-10-06 LAB — PROTIME-INR
INR: 1 (ref 0.8–1.2)
Prothrombin Time: 12.9 seconds (ref 11.4–15.2)

## 2020-10-06 LAB — APTT: aPTT: >160 seconds (ref 24–36)

## 2020-10-06 MED ORDER — LISINOPRIL 10 MG PO TABS
10.0000 mg | ORAL_TABLET | Freq: Every day | ORAL | Status: DC
  Administered 2020-10-07 – 2020-10-08 (×2): 10 mg via ORAL
  Filled 2020-10-06 (×2): qty 1

## 2020-10-06 MED ORDER — TRAZODONE HCL 50 MG PO TABS
150.0000 mg | ORAL_TABLET | Freq: Every day | ORAL | Status: DC
  Administered 2020-10-07 (×2): 150 mg via ORAL
  Filled 2020-10-06 (×2): qty 1

## 2020-10-06 MED ORDER — LINACLOTIDE 72 MCG PO CAPS
72.0000 ug | ORAL_CAPSULE | Freq: Every day | ORAL | Status: DC
  Administered 2020-10-07 – 2020-10-08 (×2): 72 ug via ORAL
  Filled 2020-10-06 (×2): qty 1

## 2020-10-06 MED ORDER — ASPIRIN 81 MG PO CHEW
81.0000 mg | CHEWABLE_TABLET | Freq: Every day | ORAL | Status: DC
  Administered 2020-10-07 – 2020-10-08 (×2): 81 mg via ORAL
  Filled 2020-10-06 (×2): qty 1

## 2020-10-06 MED ORDER — HYDROMORPHONE HCL 1 MG/ML IJ SOLN
0.5000 mg | Freq: Once | INTRAMUSCULAR | Status: AC
  Administered 2020-10-06: 0.5 mg via INTRAVENOUS
  Filled 2020-10-06: qty 1

## 2020-10-06 MED ORDER — OXYCODONE HCL 5 MG PO TABS
5.0000 mg | ORAL_TABLET | ORAL | Status: DC | PRN
  Administered 2020-10-07 (×2): 5 mg via ORAL
  Filled 2020-10-06 (×2): qty 1

## 2020-10-06 MED ORDER — FLUTICASONE FUROATE-VILANTEROL 100-25 MCG/INH IN AEPB
1.0000 | INHALATION_SPRAY | Freq: Every day | RESPIRATORY_TRACT | Status: DC
  Administered 2020-10-07 – 2020-10-08 (×2): 1 via RESPIRATORY_TRACT
  Filled 2020-10-06: qty 28

## 2020-10-06 MED ORDER — INSULIN ASPART 100 UNIT/ML ~~LOC~~ SOLN
0.0000 [IU] | Freq: Three times a day (TID) | SUBCUTANEOUS | Status: DC
  Administered 2020-10-07: 1 [IU] via SUBCUTANEOUS
  Administered 2020-10-07: 2 [IU] via SUBCUTANEOUS
  Administered 2020-10-07: 5 [IU] via SUBCUTANEOUS
  Administered 2020-10-08: 2 [IU] via SUBCUTANEOUS
  Filled 2020-10-06 (×4): qty 1

## 2020-10-06 MED ORDER — MORPHINE SULFATE (PF) 2 MG/ML IV SOLN
2.0000 mg | Freq: Once | INTRAVENOUS | Status: AC
  Administered 2020-10-06: 2 mg via INTRAVENOUS
  Filled 2020-10-06: qty 1

## 2020-10-06 MED ORDER — PANTOPRAZOLE SODIUM 40 MG PO TBEC
40.0000 mg | DELAYED_RELEASE_TABLET | Freq: Every day | ORAL | Status: DC
  Administered 2020-10-07 – 2020-10-08 (×2): 40 mg via ORAL
  Filled 2020-10-06 (×2): qty 1

## 2020-10-06 MED ORDER — HEPARIN (PORCINE) 25000 UT/250ML-% IV SOLN
1200.0000 [IU]/h | INTRAVENOUS | Status: DC
  Administered 2020-10-06: 1200 [IU]/h via INTRAVENOUS
  Filled 2020-10-06: qty 250

## 2020-10-06 MED ORDER — VENLAFAXINE HCL ER 75 MG PO CP24
75.0000 mg | ORAL_CAPSULE | Freq: Every day | ORAL | Status: DC
  Administered 2020-10-07 – 2020-10-08 (×2): 75 mg via ORAL
  Filled 2020-10-06 (×2): qty 1

## 2020-10-06 MED ORDER — SODIUM CHLORIDE 0.9 % IV BOLUS
500.0000 mL | Freq: Once | INTRAVENOUS | Status: AC
  Administered 2020-10-06: 500 mL via INTRAVENOUS

## 2020-10-06 MED ORDER — ONDANSETRON HCL 4 MG PO TABS: 4.0000 mg | ORAL_TABLET | Freq: Four times a day (QID) | ORAL | Status: DC | PRN

## 2020-10-06 MED ORDER — ISOSORBIDE MONONITRATE ER 60 MG PO TB24
120.0000 mg | ORAL_TABLET | Freq: Every day | ORAL | Status: DC
  Administered 2020-10-07 – 2020-10-08 (×2): 120 mg via ORAL
  Filled 2020-10-06 (×4): qty 2

## 2020-10-06 MED ORDER — BUDESONIDE 0.5 MG/2ML IN SUSP: 0.5000 mg | Freq: Two times a day (BID) | RESPIRATORY_TRACT | Status: DC | PRN

## 2020-10-06 MED ORDER — ATORVASTATIN CALCIUM 80 MG PO TABS
80.0000 mg | ORAL_TABLET | Freq: Every day | ORAL | Status: DC
  Administered 2020-10-07 (×2): 80 mg via ORAL
  Filled 2020-10-06 (×2): qty 1

## 2020-10-06 MED ORDER — ALLOPURINOL 300 MG PO TABS
300.0000 mg | ORAL_TABLET | Freq: Two times a day (BID) | ORAL | Status: DC
  Administered 2020-10-07 – 2020-10-08 (×3): 300 mg via ORAL
  Filled 2020-10-06 (×5): qty 1

## 2020-10-06 MED ORDER — ACETAMINOPHEN 325 MG PO TABS: 650.0000 mg | ORAL_TABLET | Freq: Four times a day (QID) | ORAL | Status: DC | PRN

## 2020-10-06 MED ORDER — ALBUTEROL SULFATE (2.5 MG/3ML) 0.083% IN NEBU: 2.5000 mg | INHALATION_SOLUTION | Freq: Four times a day (QID) | RESPIRATORY_TRACT | Status: DC | PRN

## 2020-10-06 MED ORDER — ACETAMINOPHEN 650 MG RE SUPP: 650.0000 mg | Freq: Four times a day (QID) | RECTAL | Status: DC | PRN

## 2020-10-06 MED ORDER — TIOTROPIUM BROMIDE MONOHYDRATE 18 MCG IN CAPS
18.0000 ug | ORAL_CAPSULE | Freq: Every day | RESPIRATORY_TRACT | Status: DC
  Administered 2020-10-07 – 2020-10-08 (×2): 18 ug via RESPIRATORY_TRACT
  Filled 2020-10-06: qty 5

## 2020-10-06 MED ORDER — RANOLAZINE ER 500 MG PO TB12
1000.0000 mg | ORAL_TABLET | Freq: Two times a day (BID) | ORAL | Status: DC
  Administered 2020-10-07 – 2020-10-08 (×3): 1000 mg via ORAL
  Filled 2020-10-06 (×5): qty 2

## 2020-10-06 MED ORDER — ALPRAZOLAM 0.5 MG PO TABS
1.0000 mg | ORAL_TABLET | Freq: Three times a day (TID) | ORAL | Status: DC | PRN
  Administered 2020-10-07 (×2): 1 mg via ORAL
  Filled 2020-10-06 (×2): qty 2

## 2020-10-06 MED ORDER — ONDANSETRON HCL 4 MG/2ML IJ SOLN: 4.0000 mg | Freq: Four times a day (QID) | INTRAMUSCULAR | Status: DC | PRN

## 2020-10-06 MED ORDER — SOTALOL HCL 80 MG PO TABS
80.0000 mg | ORAL_TABLET | Freq: Two times a day (BID) | ORAL | Status: DC
  Administered 2020-10-07 – 2020-10-08 (×3): 80 mg via ORAL
  Filled 2020-10-06 (×4): qty 1

## 2020-10-06 MED ORDER — OMEGA-3-ACID ETHYL ESTERS 1 G PO CAPS
4.0000 g | ORAL_CAPSULE | Freq: Every day | ORAL | Status: DC
  Administered 2020-10-07 – 2020-10-08 (×2): 4 g via ORAL
  Filled 2020-10-06 (×2): qty 4

## 2020-10-06 NOTE — ED Notes (Signed)
Pt requested for nurse to call his son Lenny Pastel to give update. Called and gave update.

## 2020-10-06 NOTE — H&P (Signed)
History and Physical    [name] LYY:[id number] DOB: 1956-11-11 DOA: 10/06/2020  I have briefly reviewed the patient's prior medical records in [place]  PCP: Birdie Sons, MD  Patient coming from: Home  Chief Complaint: Chest pain  HPI: Lawrence [name] is a 64 y.o. male with medical history significant of paroxysmal A. fib, hypertension, coronary artery disease with history of CABG as well as several stents, chronic diastolic CHF, COPD / emphysema, tobacco abuse, in remission, DM 2, presents to the hospital with chief complaint of chest pain.  Patient reports that he started having sudden onset substernal pressure-like chest pain this morning around 11 AM upon getting up and starting to walk.  Chest pain radiates into his left shoulder and left arm.  He rested for a while and the pain eased off however upon getting up and starting to walk again the pain reoccurred.  He decided to come to the ER at that time.  He is also been complaining of shortness of breath but that somewhat chronic as he has a history of COPD and emphysema.  He denies any palpitations.  Complains of mild nausea but no vomiting.  No fever or chills, no sore throat, no cough or chest congestion.  Reports compliance with his home medications.  Of note, he was recently hospitalized and discharged about a week ago with chest pain, he tells me the chest pain this time is much more severe than last week.  Cardiology saw him, underwent a stress test on 11/13 which was an intermediate risk study.  He also underwent a cath earlier this year in April which showed two-vessel CAD with patent stents, mild reduced LVEF 40-45% and no significant changes to the prior cath July 2020, recommendations were to continue medical therapy and aggressive risk factor modification.  ED Course: In the ED patient is afebrile, normotensive, blood work shows a hemoglobin of 18.9, normal renal function, high-sensitivity troponin negative.  Chest  x-ray is unremarkable.  EKG shows sinus rhythm.  Cardiology, Dr. [name] consulted by EDP, recommending heparin infusion and admission to the hospital.  Cardiology group will see in the morning.  Review of Systems: All systems reviewed, and apart from HPI, all negative  Past Medical History:  Diagnosis Date  . A-fib ([place])   . Anemia   . Anginal pain ([place])   . Anxiety   . Arthritis   . Asthma   . CAD (coronary artery disease)    a. 2002 CABGx2 (LIMA->LAD, VG->VG->OM1);  b. 09/2012 DES->OM;  c. 03/2015 PTCA of LAD [facility]) in setting of atretic LIMA; d. 05/2015 Cath [facility]): nonobs dzs; e. 06/2015 Cath ([facility]): LM nl, LAD 45p/d ISR, 50d, D1/2 small, LCX 50p/d ISR, OM1 70ost, 30 ISR, VG->OM1 50ost, 39m LIMA->LAD 99p/d - atretic, RCA dom, nl; f.cath 10/16: 40-50%(FFR 0.90) pLAD, 75% (FFR 0.77) mLAD s/p PCI/DES, oRCA 40% (FFR0.95)  . Cancer ([place]    SKIN CANCER ON BACK  . Celiac disease   . Chronic diastolic CHF (congestive heart failure) ([place]    a. 06/2009 Echo: EF 60-65%, Gr 1 DD, triv AI, mildly dil LA, nl RV.  .Marland KitchenCOPD (chronic obstructive pulmonary disease) ([place]    a. Chronic bronchitis and emphysema.  . DDD (degenerative disc disease), lumbar   . Diverticulosis   . Dysrhythmia   . Essential hypertension   . GERD (gastroesophageal reflux disease)   . History of hiatal hernia   . History of kidney stones    H/O  .  History of tobacco abuse    a. Quit 2014.  Marland Kitchen Myocardial infarction ([place]) 2002   4 STENTS  . Pancreatitis   . PSVT (paroxysmal supraventricular tachycardia) ([place])    a. 10/2012 Noted on Zio Patch.  . Sleep apnea    LOST CORD TO CPAP -ONLY 02 @ BEDTIME  . Tubular adenoma of colon   . Type II diabetes mellitus ([place])     Past Surgical History:  Procedure Laterality Date  . BYPASS GRAFT    . CARDIAC CATHETERIZATION N/A 07/12/2015   rocedure: Left Heart Cath and Cors/Grafts Angiography;  Surgeon: Belva Crome, MD;  Location: [place] [facility];  Service: Cardiovascular;   Laterality: N/A;  . CARDIAC CATHETERIZATION Right 10/07/2015   Procedure: Left Heart Cath and Cors/Grafts Angiography;  Surgeon: Dionisio David, MD;  Location: [place] [facility];  Service: Cardiovascular;  Laterality: Right;  . CARDIAC CATHETERIZATION N/A 04/06/2016   Procedure: Left Heart Cath and Coronary Angiography;  Surgeon: Yolonda Kida, MD;  Location: [place] [facility];  Service: Cardiovascular;  Laterality: N/A;  . CARDIAC CATHETERIZATION  04/06/2016   Procedure: Bypass Graft Angiography;  Surgeon: Yolonda Kida, MD;  Location: [place] [facility];  Service: Cardiovascular;;  . CARDIAC CATHETERIZATION N/A 11/02/2016   Procedure: Left Heart Cath and Cors/Grafts Angiography and possible PCI;  Surgeon: Yolonda Kida, MD;  Location: [place] [facility];  Service: Cardiovascular;  Laterality: N/A;  . CARDIAC CATHETERIZATION N/A 11/02/2016   Procedure: Coronary Stent Intervention;  Surgeon: Yolonda Kida, MD;  Location: [place] [facility];  Service: Cardiovascular;  Laterality: N/A;  . CHOLECYSTECTOMY    . CIRCUMCISION N/A 06/09/2019   Procedure: CIRCUMCISION ADULT;  Surgeon: Billey Co, MD;  Location: [facility] ORS;  Service: Urology;  Laterality: N/A;  . COLONOSCOPY WITH PROPOFOL N/A 04/01/2018   Procedure: COLONOSCOPY WITH PROPOFOL;  Surgeon: Manya Silvas, MD;  Location: [facility] ENDOSCOPY;  Service: Endoscopy;  Laterality: N/A;  . ESOPHAGEAL DILATION    . ESOPHAGOGASTRODUODENOSCOPY (EGD) WITH PROPOFOL N/A 04/01/2018   Procedure: ESOPHAGOGASTRODUODENOSCOPY (EGD) WITH PROPOFOL;  Surgeon: Manya Silvas, MD;  Location: [facility] ENDOSCOPY;  Service: Endoscopy;  Laterality: N/A;  . LEFT HEART CATH AND CORS/GRAFTS ANGIOGRAPHY N/A 06/12/2019   Procedure: LEFT HEART CATH AND CORS/GRAFTS ANGIOGRAPHY;  Surgeon: Teodoro Spray, MD;  Location: [place] [facility];  Service: Cardiovascular;  Laterality: N/A;  . LEFT HEART CATH AND CORS/GRAFTS ANGIOGRAPHY N/A 03/11/2020    Procedure: LEFT HEART CATH AND CORS/GRAFTS ANGIOGRAPHY;  Surgeon: Isaias Cowman, MD;  Location: [place] [facility];  Service: Cardiovascular;  Laterality: N/A;  . TONSILLECTOMY    . VASCULAR SURGERY       reports that he quit smoking about 7 years ago. His smoking use included cigarettes. He has a 150.00 pack-year smoking history. He has never used smokeless tobacco. He reports current drug use. Drug: Marijuana. He reports that he does not drink alcohol.  Allergies  Allergen Reactions  . Demerol  [Meperidine Hcl]   . Demerol [Meperidine] Hives  . Jardiance [Empagliflozin] Other (See Comments)    Perineal pain  . Prednisone Other (See Comments) and Hypertension    Pt states that this medication puts him in A-fib   . Sulfa Antibiotics Hives  . Albuterol Sulfate [Albuterol] Palpitations and Other (See Comments)    Pt currently uses this medication.    . Morphine Sulfate Nausea And Vomiting, Rash and Other (See Comments)    Pt states that  he is only allergic to the tablet form of this medication.      Family History  Problem Relation Age of Onset  . Heart attack Mother   . Depression Mother   . Heart disease Mother   . COPD Mother   . Hypertension Mother   . Heart attack Father   . Diabetes Father   . Depression Father   . Heart disease Father   . Cirrhosis Father   . Parkinson's disease Brother     Prior to Admission medications   Medication Sig Start Date End Date Taking? Authorizing Provider  albuterol (VENTOLIN HFA) 108 (90 Base) MCG/ACT inhaler INHALE 2 PUFFS BY MOUTH EVERY 6 HOURS AS NEEDED FOR SHORTNESS OF BREATH Patient taking differently: Inhale 2 puffs into the lungs every 6 (six) hours as needed for wheezing or shortness of breath.  05/13/20   Birdie Sons, MD  allopurinol (ZYLOPRIM) 300 MG tablet TAKE 1 TABLET BY MOUTH TWICE A DAY Patient taking differently: Take 300 mg by mouth 2 (two) times daily.  08/20/20   Birdie Sons, MD  ALPRAZolam Duanne Moron) 1  MG tablet TAKE ONE TABLET BY MOUTH THREE TIMES A DAY Patient taking differently: Take 1 mg by mouth 3 (three) times daily.  09/19/20   Birdie Sons, MD  amLODipine (NORVASC) 2.5 MG tablet Take 1 tablet (2.5 mg total) by mouth daily. 09/24/20   Birdie Sons, MD  aspirin 81 MG chewable tablet Chew 81 mg by mouth daily.    [provider]  atorvastatin (LIPITOR) 80 MG tablet TAKE ONE TABLET BY MOUTH AT BEDTIME Patient taking differently: Take 80 mg by mouth at bedtime.  07/19/20   Birdie Sons, MD  BREO ELLIPTA 100-25 MCG/INH AEPB INHALE 1 PUFF INTO THE LUNGS DAILY Patient taking differently: Inhale 1 puff into the lungs daily.  02/03/20   Birdie Sons, MD  budesonide (PULMICORT) 0.5 MG/2ML nebulizer solution Take 0.5 mg by nebulization 2 (two) times daily as needed (shortness of breath or wheezing).     [provider]  cetirizine (ZYRTEC) 10 MG tablet Take 1 tablet (10 mg total) by mouth at bedtime. 05/01/20   Birdie Sons, MD  Cholecalciferol (VITAMIN D3) 125 MCG (5000 UT) CAPS Take 5,000 Units by mouth daily.     [provider]  docusate sodium (COLACE) 100 MG capsule Take 100 mg by mouth daily.     [provider]  ELIQUIS 5 MG TABS tablet TAKE 1 TABLET BY MOUTH TWICE A DAY 05/01/20   [name], [name], MD  FARXIGA 10 MG TABS tablet TAKE 1 TABLET BY MOUTH DAILY Patient taking differently: Take 10 mg by mouth daily.  06/13/20   Birdie Sons, MD  isosorbide mononitrate (IMDUR) 120 MG 24 hr tablet TAKE 1 TABLET BY MOUTH DAILY Patient taking differently: Take 120 mg by mouth daily.  06/13/20   Birdie Sons, MD  LINZESS 72 MCG capsule Take 72 mcg by mouth daily before breakfast.     [provider]  lisinopril (ZESTRIL) 10 MG tablet TAKE 1 TABLET BY MOUTH DAILY Patient taking differently: Take 10 mg by mouth daily.  08/19/20   Birdie Sons, MD  magnesium oxide (MAG-OX) 400 (241.3 Mg) MG tablet Take 1 tablet (400 mg total) by mouth  every morning. 06/13/20   Birdie Sons, MD  metaxalone (SKELAXIN) 800 MG tablet Take 1 tablet (800 mg total) by mouth 3 (three) times daily as needed (pain).  09/24/20   Birdie Sons, MD  metFORMIN (GLUCOPHAGE-XR) 500 MG 24 hr tablet Take 1 tablet (500 mg total) by mouth daily with breakfast. 09/25/20   Birdie Sons, MD  naloxone [facility]) nasal spray 4 mg/0.1 mL 1 spray into nostril x1 and may repeat every 2-3 minutes until patient is responsive or EMS arrives 08/21/20   Birdie Sons, MD  nitroGLYCERIN (NITROSTAT) 0.4 MG SL tablet PLACE ONE TABLET UNDER THE TONGUE EVERY 5 MINUTES FOR CHEST PAIN. IF NO RELIEF PAST 3RD TAB GO TO EMERGENCY ROOM Patient taking differently: Place 0.4 mg under the tongue every 5 (five) minutes as needed for chest pain.  05/10/20   Birdie Sons, MD  omega-3 acid ethyl esters (LOVAZA) 1 g capsule TAKE 4 CAPSULES BY MOUTH DAILY Patient taking differently: Take 4 g by mouth daily.  08/12/20   Birdie Sons, MD  omeprazole (PRILOSEC) 40 MG capsule Take 40 mg by mouth daily.  03/19/20   [provider]  oxyCODONE-acetaminophen (PERCOCET) 10-325 MG tablet TAKE 1 TABLET BY MOUTH EVERY 4 HOURS AS NEEDED FOR PAIN Patient taking differently: Take 1 tablet by mouth every 4 (four) hours as needed for pain.  09/19/20   Birdie Sons, MD  ranolazine (RANEXA) 1000 MG SR tablet TAKE 1 TABLET BY MOUTH TWICE A DAY Patient taking differently: Take 1,000 mg by mouth 2 (two) times daily.  08/19/20   Birdie Sons, MD  sotalol (BETAPACE) 80 MG tablet Take 80 mg by mouth 2 (two) times daily.    [provider]  tiotropium (SPIRIVA) 18 MCG inhalation capsule Place 18 mcg into inhaler and inhale daily.    [provider]  torsemide (DEMADEX) 100 MG tablet Take 0.5 tablets (50 mg total) by mouth daily. 05/13/20   Birdie Sons, MD  traZODone (DESYREL) 150 MG tablet TAKE ONE TABLET BY MOUTH AT BEDTIME Patient taking differently: Take 150 mg by mouth at  bedtime.  08/20/20   Birdie Sons, MD  venlafaxine XR (EFFEXOR-XR) 75 MG 24 hr capsule TAKE 1 CAPSULE BY MOUTH DAILY WITH BREAKFAST Patient taking differently: Take 75 mg by mouth daily with breakfast.  08/19/20   Birdie Sons, MD    Physical Exam: Vitals:   10/06/20 1659 10/06/20 1800 10/06/20 1930 10/06/20 2100  BP:  126/71 117/75 120/82  Pulse:  (!) 54 100 97  Resp:  11 12 (!) 8  Temp:      TempSrc:      SpO2:  98% (!) 89% 97%  Weight: 98.4 kg     Height: 5' 7"  (1.702 m)      Constitutional: NAD, calm, comfortable Eyes: PERRL, lids and conjunctivae normal ENMT: Mucous membranes are moist.  Neck: normal, supple Respiratory: clear to auscultation bilaterally, no wheezing, no crackles. Normal respiratory effort. No accessory muscle use.  Cardiovascular: Regular rate and rhythm, no murmurs / rubs / gallops. No extremity edema. 2+ pedal pulses.  Abdomen: no tenderness, no masses palpated. Bowel sounds positive.  Musculoskeletal: no clubbing / cyanosis. Normal muscle tone.  Skin: no rashes Neurologic: CN 2-12 grossly intact. Strength 5/5 in all 4.  Psychiatric: Alert and oriented x 3. Normal mood.   Labs on Admission: I have personally reviewed following labs and imaging studies  CBC: Recent Labs  Lab 10/06/20 1704  WBC 5.9  HGB 18.9*  HCT 55.8*  MCV 94.1  PLT 643   Basic Metabolic Panel: Recent Labs  Lab 10/06/20 1704  NA 139  K 4.0  CL 100  CO2 31  GLUCOSE 124*  BUN 9  CREATININE 0.83  CALCIUM 8.8*   Liver Function Tests: No results for input(s): AST, ALT, ALKPHOS, BILITOT, PROT, ALBUMIN in the last 168 hours. Coagulation Profile: Recent Labs  Lab 10/06/20 1704  INR 1.0   BNP (last 3 results) No results for input(s): PROBNP in the last 8760 hours. CBG: No results for input(s): GLUCAP in the last 168 hours. Thyroid Function Tests: No results for input(s): TSH, T4TOTAL, FREET4, T3FREE, THYROIDAB in the last 72 hours. Urine analysis:     Component Value Date/Time   COLORURINE AMBER (A) 10/03/2019 2002   APPEARANCEUR CLEAR (A) 10/03/2019 2002   APPEARANCEUR Clear 05/25/2019 1554   LABSPEC 1.037 (H) 10/03/2019 2002   LABSPEC 1.012 03/07/2015 2143   PHURINE 5.0 10/03/2019 2002   GLUCOSEU >=500 (A) 10/03/2019 2002   GLUCOSEU Negative 03/07/2015 2143   HGBUR NEGATIVE 10/03/2019 2002   [place] NEGATIVE 10/03/2019 2002   BILIRUBINUR Negative 05/25/2019 1554   BILIRUBINUR Negative 03/07/2015 2143   [place] NEGATIVE 10/03/2019 2002   PROTEINUR 100 (A) 10/03/2019 2002   UROBILINOGEN 0.2 03/26/2017 0907   NITRITE NEGATIVE 10/03/2019 2002   LEUKOCYTESUR NEGATIVE 10/03/2019 2002   LEUKOCYTESUR Negative 03/07/2015 2143     Radiological Exams on Admission: DG Chest 2 View  Result Date: 10/06/2020 CLINICAL DATA:  Chest pain EXAM: CHEST - 2 VIEW COMPARISON:  September 26, 2020 FINDINGS: Cardiomediastinal silhouette is normal. No pneumothorax. The hila and mediastinum are normal. No focal infiltrates. No overt edema. IMPRESSION: No active cardiopulmonary disease. Electronically Signed   By: Dorise Bullion III M.D   On: 10/06/2020 17:38    EKG: Independently reviewed.  Sinus rhythm  Assessment/Plan  Principal Problem Chest pain, concern for UA, history of CAD / CABG -Patient will be placed on a heparin drip per cardiology, I have consulted Dr. [name], they will see patient in the morning.  Will make n.p.o. after midnight -Continue home Ranexa, pain control, Imdur  Active Problems DM2 -Hold home medications, place on sliding scale  COPD, Emphysema, tobacco use - in remission -Resume home medication with albuterol, Breo Ellipta  PAF -Currently in sinus, continue beta-blockers, hold home Eliquis as he is anticoagulated with heparin -Monitor on telemetry  HTN -Resume beta-blocker, monitor blood pressure  HLD -Continue atorvastatin  Depression -Continue Effexor  DVT prophylaxis: heparin infusion  Code  Status: Full code  Family Communication: no family present Disposition Plan: home when ready Bed Type: telemetry Consults called: cardiology, Dr [name] Obs/Inp: Obs  Marzetta Board, MD, PhD [facility]  Contact via [email or web address]  10/06/2020, 9:46 PM

## 2020-10-06 NOTE — ED Notes (Signed)
EDP at bedside  

## 2020-10-06 NOTE — Consult Note (Signed)
ANTICOAGULATION CONSULT NOTE  Pharmacy Consult for Heparin Infusion Indication: chest pain/ACS  Patient Measurements: Heparin Dosing Weight: 87.4 kg   Labs: Recent Labs    10/06/20 1650 10/06/20 1704 10/06/20 1953  HGB  --  18.9*  --   HCT  --  55.8*  --   PLT  --  173  --   LABPROT  --  12.9  --   INR  --  1.0  --   CREATININE  --  0.83  --   TROPONINIHS 12 13 12     Estimated Creatinine Clearance: 97.8 mL/min (by C-G formula based on SCr of 0.83 mg/dL).   Medical History: Past Medical History:  Diagnosis Date  . A-fib ([place])   . Anemia   . Anginal pain ([place])   . Anxiety   . Arthritis   . Asthma   . CAD (coronary artery disease)    a. 2002 CABGx2 (LIMA->LAD, VG->VG->OM1);  b. 09/2012 DES->OM;  c. 03/2015 PTCA of LAD [facility]) in setting of atretic LIMA; d. 05/2015 Cath [facility]): nonobs dzs; e. 06/2015 Cath ([facility]): LM nl, LAD 45p/d ISR, 50d, D1/2 small, LCX 50p/d ISR, OM1 70ost, 30 ISR, VG->OM1 50ost, 15m LIMA->LAD 99p/d - atretic, RCA dom, nl; f.cath 10/16: 40-50%(FFR 0.90) pLAD, 75% (FFR 0.77) mLAD s/p PCI/DES, oRCA 40% (FFR0.95)  . Cancer ([place]    SKIN CANCER ON BACK  . Celiac disease   . Chronic diastolic CHF (congestive heart failure) ([place]    a. 06/2009 Echo: EF 60-65%, Gr 1 DD, triv AI, mildly dil LA, nl RV.  .Marland KitchenCOPD (chronic obstructive pulmonary disease) ([place]    a. Chronic bronchitis and emphysema.  . DDD (degenerative disc disease), lumbar   . Diverticulosis   . Dysrhythmia   . Essential hypertension   . GERD (gastroesophageal reflux disease)   . History of hiatal hernia   . History of kidney stones    H/O  . History of tobacco abuse    a. Quit 2014.  .Marland KitchenMyocardial infarction ([place] 2002   4 STENTS  . Pancreatitis   . PSVT (paroxysmal supraventricular tachycardia) ([place]    a. 10/2012 Noted on Zio Patch.  . Sleep apnea    LOST CORD TO CPAP -ONLY 02 @ BEDTIME  . Tubular adenoma of colon   . Type II diabetes mellitus ([facility])     Medications:  Apixaban 5 mg BID  prior to admission (patient reported last dose was 11/21 at 0900)  Assessment: Patient is a 64y/o M with medical history as above who presented to the ED 11/21 with chest pain. Troponin flat around 12. Pharmacy has been consulted to initiate heparin infusion for suspected ACS.  Baseline INR 1. Baseline aPTT and heparin level pending. Baseline CBC notable for hemoglobin of 18.9.   Goal of Therapy:  aPTT 66 - 102 seconds Monitor platelets by anticoagulation protocol: Yes   Plan:  --Start heparin infusion at 1200 units/hr, no bolus --Check aPTT 6 hours after initiation of infusion. Will follow aPTT for now given presumed interference of apixaban on anti-Xa levels --Daily CBC per protocol  ABenita Gutter11/21/2021,9:35 PM

## 2020-10-06 NOTE — ED Provider Notes (Signed)
[facility] Emergency Department Provider Note  Time seen: 5:10 PM  I have reviewed the triage vital signs and the nursing notes.   HISTORY  Chief Complaint Chest Pain   HPI Lawrence [name] is a 64 y.o. male with a past medical history of atrial fibrillation, anxiety, asthma, hypertension, diabetes, CAD status post MI 2004, status post CABG with several stents since presents to the emergency department for chest pain.  According to the patient at 11:00 this morning he developed chest pain states it was a 9 or 10/10 dull pain in the center of his chest along with some shortness of breath mild nausea and diaphoresis.  Patient has taken 3 nitroglycerin tablets at home without relief so he called EMS this afternoon.  EMS gave an additional spray of nitroglycerin and 324 of aspirin.  Patient states continued 8/10 chest pain currently unrelieved by nitroglycerin.  Denies any current shortness of breath or nausea.  No diaphoresis.  No leg pain or swelling.  Patient states this does feel similar to prior MI.  Past Medical History:  Diagnosis Date  . A-fib ([place])   . Anemia   . Anginal pain ([place])   . Anxiety   . Arthritis   . Asthma   . CAD (coronary artery disease)    a. 2002 CABGx2 (LIMA->LAD, VG->VG->OM1);  b. 09/2012 DES->OM;  c. 03/2015 PTCA of LAD [facility]) in setting of atretic LIMA; d. 05/2015 Cath [facility]): nonobs dzs; e. 06/2015 Cath ([facility]): LM nl, LAD 45p/d ISR, 50d, D1/2 small, LCX 50p/d ISR, OM1 70ost, 30 ISR, VG->OM1 50ost, 50m LIMA->LAD 99p/d - atretic, RCA dom, nl; f.cath 10/16: 40-50%(FFR 0.90) pLAD, 75% (FFR 0.77) mLAD s/p PCI/DES, oRCA 40% (FFR0.95)  . Cancer ([place]    SKIN CANCER ON BACK  . Celiac disease   . Chronic diastolic CHF (congestive heart failure) ([place]    a. 06/2009 Echo: EF 60-65%, Gr 1 DD, triv AI, mildly dil LA, nl RV.  .Marland KitchenCOPD (chronic obstructive pulmonary disease) (HSouth Lyon    a. Chronic bronchitis and emphysema.  . DDD (degenerative disc disease),  lumbar   . Diverticulosis   . Dysrhythmia   . Essential hypertension   . GERD (gastroesophageal reflux disease)   . History of hiatal hernia   . History of kidney stones    H/O  . History of tobacco abuse    a. Quit 2014.  .Marland KitchenMyocardial infarction (HTabor 2002   4 STENTS  . Pancreatitis   . PSVT (paroxysmal supraventricular tachycardia) ([place]    a. 10/2012 Noted on Zio Patch.  . Sleep apnea    LOST CORD TO CPAP -ONLY 02 @ BEDTIME  . Tubular adenoma of colon   . Type II diabetes mellitus ([facility]     Patient Active Problem List   Diagnosis Date Noted  . Polycythemia 10/05/2020  . NSTEMI (non-ST elevated myocardial infarction) ([place]not[place] 09/26/2020  . Primary insomnia 05/13/2020  . Recurrent major depressive disorder, remission status unspecified (HGren[place] 03/29/2020  . Elevated troponin 03/07/2020  . Bereavement 09/09/2019  . Chronic respiratory failure with hypoxia ([place] 05/29/2019  . Morbid obesity ([place] 05/29/2019  . Trigger thumb of right hand 11/28/2018  . Eye pain, left 08/18/2018  . Acute on chronic heart failure (HDundee 04/18/2018  . History of adenomatous polyp of colon 04/05/2018  . Abdominal pain, chronic, epigastric 11/06/2017  . Bilateral flank pain 03/24/2017  . Dyspnea 04/03/2016  . Hypotension 04/03/2016  . CKD (chronic kidney disease) stage 2, GFR 60-89  ml/min 04/03/2016  . Anemia 04/03/2016  . Paroxysmal atrial fibrillation ([place]) 12/23/2015  . OSA (obstructive sleep apnea) 12/10/2015  . Left inguinal hernia 11/07/2015  . Anxiety 11/07/2015  . Unstable angina ([place]) 10/05/2015  . Back pain with left-sided radiculopathy 09/30/2015  . Nocturnal hypoxia 09/06/2015  . BPH (benign prostatic hyperplasia) 08/01/2015  . Chronic diastolic CHF (congestive heart failure) ([place])   . Angina pectoris ([place])   . Chest pain 07/11/2015  . COPD (chronic obstructive pulmonary disease) ([place]) 07/03/2015  . CAD (coronary artery disease) 06/26/2015  . HTN (hypertension) 06/26/2015  .  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[place]) 06/26/2015  . Achalasia 07/24/2014  . GERD (gastroesophageal reflux disease) 06/07/2014  . Former tobacco use 04/11/2013  . HLD (hyperlipidemia) 04/09/2013    Past Surgical History:  Procedure Laterality Date  . BYPASS GRAFT    . CARDIAC CATHETERIZATION N/A 07/12/2015   rocedure: Left Heart Cath and Cors/Grafts Angiography;  Surgeon: Belva Crome, MD;  Location: [place] CV LAB;  Service: Cardiovascular;  Laterality: N/A;  . CARDIAC CATHETERIZATION Right 10/07/2015   Procedure: Left Heart Cath and Cors/Grafts Angiography;  Surgeon: Dionisio David, MD;  Location: [place] CV LAB;  Service: Cardiovascular;  Laterality: Right;  . CARDIAC CATHETERIZATION N/A 04/06/2016   Procedure: Left Heart Cath and Coronary Angiography;  Surgeon: Yolonda Kida, MD;  Location: [place] CV LAB;  Service: Cardiovascular;  Laterality: N/A;  . CARDIAC CATHETERIZATION  04/06/2016   Procedure: Bypass Graft Angiography;  Surgeon: Yolonda Kida, MD;  Location: [place] CV LAB;  Service: Cardiovascular;;  . CARDIAC CATHETERIZATION N/A 11/02/2016   Procedure: Left Heart Cath and Cors/Grafts Angiography and possible PCI;  Surgeon: Yolonda Kida, MD;  Location: [place] CV LAB;  Service: Cardiovascular;  Laterality: N/A;  . CARDIAC CATHETERIZATION N/A 11/02/2016   Procedure: Coronary Stent Intervention;  Surgeon: Yolonda Kida, MD;  Location: [place] CV LAB;  Service: Cardiovascular;  Laterality: N/A;  . CHOLECYSTECTOMY    . CIRCUMCISION N/A 06/09/2019   Procedure: CIRCUMCISION ADULT;  Surgeon: Billey Co, MD;  Location: [facility] ORS;  Service: Urology;  Laterality: N/A;  . COLONOSCOPY WITH PROPOFOL N/A 04/01/2018   Procedure: COLONOSCOPY WITH PROPOFOL;  Surgeon: Manya Silvas, MD;  Location: [facility] ENDOSCOPY;  Service: Endoscopy;  Laterality: N/A;  . ESOPHAGEAL DILATION    . ESOPHAGOGASTRODUODENOSCOPY (EGD) WITH PROPOFOL N/A  04/01/2018   Procedure: ESOPHAGOGASTRODUODENOSCOPY (EGD) WITH PROPOFOL;  Surgeon: Manya Silvas, MD;  Location[facility]COPY;  Service: Endoscopy;  Laterality: N/A;  . LEFT HEART CATH AND CORS/GRAFTS ANGIOGRAPHY N/A 06/12/2019   Procedure: LEFT HEART CATH AND CORS/GRAFTS ANGIOGRAPHY;  Surgeon: Teodoro Spray, MD;  Location:  CV LAB;  Service: Cardiovascular;  Laterality: N/A;  . LEFT HEART CATH AND CORS/GRAFTS ANGIOGRAPHY N/A 03/11/2020   Procedure: LEFT HEART CATH AND CORS/GRAFTS ANGIOGRAPHY;  Surgeon: Isaias Cowman, MD;  Location: [place] CV LAB;  Service: Cardiovascular;  Laterality: N/A;  . TONSILLECTOMY    . VASCULAR SURGERY      Prior to Admission medications   Medication Sig Start Date End Date Taking? Authorizing Provider  albuterol (VENTOLIN HFA) 108 (90 Base) MCG/ACT inhaler INHALE 2 PUFFS BY MOUTH EVERY 6 HOURS AS NEEDED FOR SHORTNESS OF BREATH Patient taking differently: Inhale 2 puffs into the lungs every 6 (six) hours as needed for wheezing or shortness of breath.  05/13/20   Birdie Sons, MD  allopurinol (ZYLOPRIM) 300 MG tablet TAKE 1 TABLET BY  MOUTH TWICE A DAY Patient taking differently: Take 300 mg by mouth 2 (two) times daily.  08/20/20   Birdie Sons, MD  ALPRAZolam Duanne Moron) 1 MG tablet TAKE ONE TABLET BY MOUTH THREE TIMES A DAY Patient taking differently: Take 1 mg by mouth 3 (three) times daily.  09/19/20   Birdie Sons, MD  amLODipine (NORVASC) 2.5 MG tablet Take 1 tablet (2.5 mg total) by mouth daily. 09/24/20   Birdie Sons, MD  aspirin 81 MG chewable tablet Chew 81 mg by mouth daily.    [provider]  atorvastatin (LIPITOR) 80 MG tablet TAKE ONE TABLET BY MOUTH AT BEDTIME Patient taking differently: Take 80 mg by mouth at bedtime.  07/19/20   Birdie Sons, MD  BREO ELLIPTA 100-25 MCG/INH AEPB INHALE 1 PUFF INTO THE LUNGS DAILY Patient taking differently: Inhale 1 puff into the lungs daily.  02/03/20   Birdie Sons, MD  budesonide (PULMICORT) 0.5 MG/2ML nebulizer solution Take 0.5 mg by nebulization 2 (two) times daily as needed (shortness of breath or wheezing).     [provider]  cetirizine (ZYRTEC) 10 MG tablet Take 1 tablet (10 mg total) by mouth at bedtime. 05/01/20   Birdie Sons, MD  Cholecalciferol (VITAMIN D3) 125 MCG (5000 UT) CAPS Take 5,000 Units by mouth daily.     [provider]  docusate sodium (COLACE) 100 MG capsule Take 100 mg by mouth daily.     [provider]  ELIQUIS 5 MG TABS tablet TAKE 1 TABLET BY MOUTH TWICE A DAY 05/01/20   [name], [name], MD  FARXIGA 10 MG TABS tablet TAKE 1 TABLET BY MOUTH DAILY Patient taking differently: Take 10 mg by mouth daily.  06/13/20   Birdie Sons, MD  isosorbide mononitrate (IMDUR) 120 MG 24 hr tablet TAKE 1 TABLET BY MOUTH DAILY Patient taking differently: Take 120 mg by mouth daily.  06/13/20   Birdie Sons, MD  LINZESS 72 MCG capsule Take 72 mcg by mouth daily before breakfast.     [provider]  lisinopril (ZESTRIL) 10 MG tablet TAKE 1 TABLET BY MOUTH DAILY Patient taking differently: Take 10 mg by mouth daily.  08/19/20   Birdie Sons, MD  magnesium oxide (MAG-OX) 400 (241.3 Mg) MG tablet Take 1 tablet (400 mg total) by mouth every morning. 06/13/20   Birdie Sons, MD  metaxalone (SKELAXIN) 800 MG tablet Take 1 tablet (800 mg total) by mouth 3 (three) times daily as needed (pain). 09/24/20   Birdie Sons, MD  metFORMIN (GLUCOPHAGE-XR) 500 MG 24 hr tablet Take 1 tablet (500 mg total) by mouth daily with breakfast. 09/25/20   Birdie Sons, MD  naloxone [facility]) nasal spray 4 mg/0.1 mL 1 spray into nostril x1 and may repeat every 2-3 minutes until patient is responsive or EMS arrives 08/21/20   Birdie Sons, MD  nitroGLYCERIN (NITROSTAT) 0.4 MG SL tablet PLACE ONE TABLET UNDER THE TONGUE EVERY 5 MINUTES FOR CHEST PAIN. IF NO RELIEF PAST 3RD TAB GO TO EMERGENCY ROOM Patient  taking differently: Place 0.4 mg under the tongue every 5 (five) minutes as needed for chest pain.  05/10/20   Birdie Sons, MD  omega-3 acid ethyl esters (LOVAZA) 1 g capsule TAKE 4 CAPSULES BY MOUTH DAILY Patient taking differently: Take 4 g by mouth daily.  08/12/20   Birdie Sons, MD  omeprazole (PRILOSEC) 40 MG capsule Take 40 mg by mouth  daily.  03/19/20   [provider]  oxyCODONE-acetaminophen (PERCOCET) 10-325 MG tablet TAKE 1 TABLET BY MOUTH EVERY 4 HOURS AS NEEDED FOR PAIN Patient taking differently: Take 1 tablet by mouth every 4 (four) hours as needed for pain.  09/19/20   Birdie Sons, MD  ranolazine (RANEXA) 1000 MG SR tablet TAKE 1 TABLET BY MOUTH TWICE A DAY Patient taking differently: Take 1,000 mg by mouth 2 (two) times daily.  08/19/20   Birdie Sons, MD  sotalol (BETAPACE) 80 MG tablet Take 80 mg by mouth 2 (two) times daily.    [provider]  tiotropium (SPIRIVA) 18 MCG inhalation capsule Place 18 mcg into inhaler and inhale daily.    [provider]  torsemide (DEMADEX) 100 MG tablet Take 0.5 tablets (50 mg total) by mouth daily. 05/13/20   Birdie Sons, MD  traZODone (DESYREL) 150 MG tablet TAKE ONE TABLET BY MOUTH AT BEDTIME Patient taking differently: Take 150 mg by mouth at bedtime.  08/20/20   Birdie Sons, MD  venlafaxine XR (EFFEXOR-XR) 75 MG 24 hr capsule TAKE 1 CAPSULE BY MOUTH DAILY WITH BREAKFAST Patient taking differently: Take 75 mg by mouth daily with breakfast.  08/19/20   Birdie Sons, MD    Allergies  Allergen Reactions  . Demerol  [Meperidine Hcl]   . Demerol [Meperidine] Hives  . Jardiance [Empagliflozin] Other (See Comments)    Perineal pain  . Prednisone Other (See Comments) and Hypertension    Pt states that this medication puts him in A-fib   . Sulfa Antibiotics Hives  . Albuterol Sulfate [Albuterol] Palpitations and Other (See Comments)    Pt currently uses this medication.    . Morphine  Sulfate Nausea And Vomiting, Rash and Other (See Comments)    Pt states that he is only allergic to the tablet form of this medication.      Family History  Problem Relation Age of Onset  . Heart attack Mother   . Depression Mother   . Heart disease Mother   . COPD Mother   . Hypertension Mother   . Heart attack Father   . Diabetes Father   . Depression Father   . Heart disease Father   . Cirrhosis Father   . Parkinson's disease Brother     Social History Social History   Tobacco Use  . Smoking status: Former Smoker    Packs/day: 3.00    Years: 50.00    Pack years: 150.00    Types: Cigarettes    Quit date: 04/22/2013    Years since quitting: 7.4  . Smokeless tobacco: Never Used  Vaping Use  . Vaping Use: Never used  Substance Use Topics  . Alcohol use: No    Comment: remotely quit alcohol use. Hx of heavy alcohol use.  . Drug use: Yes    Types: Marijuana    Comment: occasionally    Review of Systems Constitutional: Negative for fever. Cardiovascular: 8/10 central chest pain Respiratory: Negative for shortness of breath. Gastrointestinal: Negative for abdominal pain, vomiting.  Positive for nausea, now resolved Genitourinary: Negative for urinary compaints Musculoskeletal: Negative for musculoskeletal complaints Neurological: Negative for headache All other ROS negative  ____________________________________________   PHYSICAL EXAM:  VITAL SIGNS: ED Triage Vitals  Enc Vitals Group     BP 10/06/20 1658 (!) 131/92     Pulse Rate 10/06/20 1658 (!) 55     Resp 10/06/20 1658 12     Temp 10/06/20 1658  98.4 F (36.9 C)     Temp Source 10/06/20 1658 Oral     SpO2 10/06/20 1657 99 %     Weight 10/06/20 1659 217 lb (98.4 kg)     Height 10/06/20 1659 5' 7"  (1.702 m)     Head Circumference --      Peak Flow --      Pain Score 10/06/20 1659 8     Pain Loc --      Pain Edu? --      Excl. in [place]? --    Constitutional: Alert and oriented. Well appearing and in no  distress. Eyes: Normal exam ENT      Head: Normocephalic and atraumatic.      Mouth/Throat: Mucous membranes are moist. Cardiovascular: Normal rate, regular rhythm. Respiratory: Normal respiratory effort without tachypnea nor retractions. Breath sounds are clear Gastrointestinal: Soft and nontender. No distention.   Musculoskeletal: Nontender with normal range of motion in all extremities. No lower extremity tenderness or edema. Neurologic:  Normal speech and language. No gross focal neurologic deficits Skin:  Skin is warm, dry and intact.  Psychiatric: Mood and affect are normal.   ____________________________________________    EKG  EKG viewed and interpreted by myself shows a normal sinus rhythm at 56 bpm with a narrow QRS, normal axis, normal intervals, nonspecific ST changes.  ____________________________________________    RADIOLOGY  Chest x-ray is negative  ____________________________________________   INITIAL IMPRESSION / ASSESSMENT AND PLAN / ED COURSE  Pertinent labs & imaging results that were available during my care of the patient were reviewed by me and considered in my medical decision making (see chart for details).   Patient presents to the emergency department for chest pain.  History of CAD MI and CABG with several stents since.  States he had a stress test performed last week with no acute findings.  Follows up with Dr. Ubaldo Glassing of cardiology.  We will dose pain medication, obtain labs including cardiac enzymes and a chest x-ray.  EKG does not appear to show any significant or concerning findings at this time.  Patient's work-up is essentially negative so far.  Troponin is negative.  EKG and chest x-ray are reassuring.  I reviewed the patient's recent work-up including recent admission recent stress test.  Patient does have an elevated hemoglobin but has a follow-up appointment with hematology tomorrow.  We will continue to closely monitor the patient.  We will  treat pain as needed we will repeat a troponin at 7 PM.  Patient's repeat troponin remains negative however patient continues to state 7-8/10 pain.  Given the patient's continued chest pain and history of significant cardiovascular disease I spoke to Dr. [name] who is on-call for Dr. Ubaldo Glassing.  Even with a recent negative admission he has significant cardiovascular disease and is recommending admission on heparin infusion with cardiology consultation trending troponins.  We will speak to the hospitalist for admission.  Lawrence [name] was evaluated in Emergency Department on 10/06/2020 for the symptoms described in the history of present illness. He was evaluated in the context of the global COVID-19 pandemic, which necessitated consideration that the patient might be at risk for infection with the SARS-CoV-2 virus that causes COVID-19. Institutional protocols and algorithms that pertain to the evaluation of patients at risk for COVID-19 are in a state of rapid change based on information released by regulatory bodies including the CDC and federal and state organizations. These policies and algorithms were followed during the patient's care in the  ED.  ____________________________________________   FINAL CLINICAL IMPRESSION(S) / ED DIAGNOSES  Chest pain   Harvest Dark, MD 10/06/20 2124

## 2020-10-06 NOTE — ED Notes (Signed)
Son Joneen Caraway called and informed of planned admission per pt request

## 2020-10-06 NOTE — ED Notes (Signed)
Pt to xray

## 2020-10-06 NOTE — ED Notes (Signed)
New troponin sent to lab

## 2020-10-06 NOTE — ED Notes (Signed)
Attempted to call report on pt, receiving nurse busy, will call back, phone number given.

## 2020-10-06 NOTE — ED Triage Notes (Signed)
Pt to ED via [facility] from home for 8/10 mid chest pain that radiates to L arm and elbow started around 11 am, "like an elephant on chest". Pt has known cardiac hx with MI in 2002 with CABG then 4 stents later on. Pt received 1 NTG spray and 352m ASA by EMS. EMS found pt to be 93 on RA, pt usually uses 2L oxygen for COPD, pt was put on 3L oxygen per [place] and then sats were 96. Pt currently 99% on 3L oxygen [place].

## 2020-10-07 ENCOUNTER — Encounter: Payer: Self-pay | Admitting: Internal Medicine

## 2020-10-07 ENCOUNTER — Observation Stay: Payer: Medicare HMO

## 2020-10-07 ENCOUNTER — Inpatient Hospital Stay: Payer: Medicare HMO

## 2020-10-07 ENCOUNTER — Inpatient Hospital Stay: Payer: Medicare HMO | Admitting: Oncology

## 2020-10-07 DIAGNOSIS — I10 Essential (primary) hypertension: Secondary | ICD-10-CM

## 2020-10-07 DIAGNOSIS — D751 Secondary polycythemia: Secondary | ICD-10-CM

## 2020-10-07 DIAGNOSIS — I480 Paroxysmal atrial fibrillation: Secondary | ICD-10-CM | POA: Diagnosis not present

## 2020-10-07 DIAGNOSIS — I200 Unstable angina: Secondary | ICD-10-CM | POA: Diagnosis not present

## 2020-10-07 DIAGNOSIS — R079 Chest pain, unspecified: Secondary | ICD-10-CM | POA: Diagnosis not present

## 2020-10-07 DIAGNOSIS — I25119 Atherosclerotic heart disease of native coronary artery with unspecified angina pectoris: Secondary | ICD-10-CM | POA: Diagnosis not present

## 2020-10-07 LAB — GLUCOSE, CAPILLARY
Glucose-Capillary: 194 mg/dL — ABNORMAL HIGH (ref 70–99)
Glucose-Capillary: 261 mg/dL — ABNORMAL HIGH (ref 70–99)
Glucose-Capillary: 132 mg/dL — ABNORMAL HIGH (ref 70–99)
Glucose-Capillary: 167 mg/dL — ABNORMAL HIGH (ref 70–99)

## 2020-10-07 LAB — COMPREHENSIVE METABOLIC PANEL
ALT: 15 U/L (ref 0–44)
AST: 15 U/L (ref 15–41)
Albumin: 3.3 g/dL — ABNORMAL LOW (ref 3.5–5.0)
Alkaline Phosphatase: 53 U/L (ref 38–126)
Anion gap: 7 (ref 5–15)
BUN: 11 mg/dL (ref 8–23)
CO2: 33 mmol/L — ABNORMAL HIGH (ref 22–32)
Calcium: 8.9 mg/dL (ref 8.9–10.3)
Chloride: 101 mmol/L (ref 98–111)
Creatinine, Ser: 0.87 mg/dL (ref 0.61–1.24)
GFR, Estimated: >60 mL/min (ref >60)
Glucose, Bld: 173 mg/dL — ABNORMAL HIGH (ref 70–99)
Potassium: 4.5 mmol/L (ref 3.5–5.1)
Sodium: 141 mmol/L (ref 135–145)
Total Bilirubin: 0.6 mg/dL (ref 0.3–1.2)
Total Protein: 5.8 g/dL — ABNORMAL LOW (ref 6.5–8.1)

## 2020-10-07 LAB — CBC
HCT: 49.3 % (ref 39.0–52.0)
Hemoglobin: 16.2 g/dL (ref 13.0–17.0)
MCH: 31.3 pg (ref 26.0–34.0)
MCHC: 32.9 g/dL (ref 30.0–36.0)
MCV: 95.4 fL (ref 80.0–100.0)
Platelets: 153 10*3/uL (ref 150–400)
RBC: 5.17 MIL/uL (ref 4.22–5.81)
RDW: 13.3 % (ref 11.5–15.5)
WBC: 6.1 10*3/uL (ref 4.0–10.5)
nRBC: 0 % (ref 0.0–0.2)

## 2020-10-07 LAB — TROPONIN I (HIGH SENSITIVITY)
Troponin I (High Sensitivity): 10 ng/L (ref <18)
Troponin I (High Sensitivity): 11 ng/L (ref <18)

## 2020-10-07 LAB — HEPARIN LEVEL (UNFRACTIONATED)
Heparin Unfractionated: 1.48 IU/mL — ABNORMAL HIGH (ref 0.30–0.70)
Heparin Unfractionated: 0.76 IU/mL — ABNORMAL HIGH (ref 0.30–0.70)

## 2020-10-07 LAB — APTT
aPTT: >160 seconds (ref 24–36)
aPTT: 68 seconds — ABNORMAL HIGH (ref 24–36)
aPTT: 55 seconds — ABNORMAL HIGH (ref 24–36)

## 2020-10-07 LAB — HIV ANTIBODY (ROUTINE TESTING W REFLEX): HIV Screen 4th Generation wRfx: NONREACTIVE

## 2020-10-07 MED ORDER — OXYCODONE HCL 5 MG PO TABS
10.0000 mg | ORAL_TABLET | ORAL | Status: DC | PRN
  Administered 2020-10-07 – 2020-10-08 (×3): 10 mg via ORAL
  Filled 2020-10-07 (×3): qty 2

## 2020-10-07 MED ORDER — HEPARIN (PORCINE) 25000 UT/250ML-% IV SOLN
1050.0000 [IU]/h | INTRAVENOUS | Status: DC
  Administered 2020-10-07 (×2): 900 [IU]/h via INTRAVENOUS
  Filled 2020-10-07: qty 250

## 2020-10-07 MED ORDER — IOHEXOL 350 MG/ML SOLN
75.0000 mL | Freq: Once | INTRAVENOUS | Status: AC | PRN
  Administered 2020-10-07: 75 mL via INTRAVENOUS

## 2020-10-07 MED ORDER — SODIUM CHLORIDE 0.9% FLUSH
3.0000 mL | Freq: Two times a day (BID) | INTRAVENOUS | Status: DC
  Administered 2020-10-07 – 2020-10-08 (×2): 3 mL via INTRAVENOUS

## 2020-10-07 MED ORDER — NITROGLYCERIN 2 % TD OINT
0.5000 [in_us] | TOPICAL_OINTMENT | Freq: Three times a day (TID) | TRANSDERMAL | Status: DC | PRN
  Administered 2020-10-07: 0.5 [in_us] via TOPICAL
  Filled 2020-10-07: qty 1

## 2020-10-07 NOTE — Plan of Care (Signed)
  Problem: Education: Goal: Knowledge of General Education information will improve Description: Including pain rating scale, medication(s)/side effects and non-pharmacologic comfort measures Outcome: Progressing   Problem: Health Behavior/Discharge Planning: Goal: Ability to manage health-related needs will improve Outcome: Progressing   Problem: Clinical Measurements: Goal: Ability to maintain clinical measurements within normal limits will improve Outcome: Progressing Goal: Will remain free from infection Outcome: Progressing Goal: Diagnostic test results will improve Outcome: Progressing Goal: Respiratory complications will improve Outcome: Progressing Goal: Cardiovascular complication will be avoided Outcome: Progressing   Problem: Coping: Goal: Level of anxiety will decrease Outcome: Progressing   

## 2020-10-07 NOTE — Consult Note (Signed)
ANTICOAGULATION CONSULT NOTE  Pharmacy Consult for Heparin Infusion Indication: chest pain/ACS  Patient Measurements: Heparin Dosing Weight: 87.4 kg   Labs: Recent Labs    10/06/20 1704 10/06/20 1953 10/06/20 2256 10/07/20 0253 10/07/20 0459 10/07/20 0656 10/07/20 1227  HGB 18.9*  --   --  16.2  --   --   --   HCT 55.8*  --   --  49.3  --   --   --   PLT 173  --   --  153  --   --   --   APTT  --   --  >160* >160*  --   --  68*  LABPROT 12.9  --   --   --   --   --   --   INR 1.0  --   --   --   --   --   --   HEPARINUNFRC  --   --  2.16* 1.48*  --   --  0.76*  CREATININE 0.83  --   --  0.87  --   --   --   TROPONINIHS 13   < > 13  --  10 11  --    < > = values in this interval not displayed.    Estimated Creatinine Clearance: 93.8 mL/min (by C-G formula based on SCr of 0.87 mg/dL).   Medical History: Past Medical History:  Diagnosis Date  . A-fib ([place])   . Anemia   . Anginal pain ([place])   . Anxiety   . Arthritis   . Asthma   . CAD (coronary artery disease)    a. 2002 CABGx2 (LIMA->LAD, VG->VG->OM1);  b. 09/2012 DES->OM;  c. 03/2015 PTCA of LAD [facility]) in setting of atretic LIMA; d. 05/2015 Cath [facility]): nonobs dzs; e. 06/2015 Cath ([facility]): LM nl, LAD 45p/d ISR, 50d, D1/2 small, LCX 50p/d ISR, OM1 70ost, 30 ISR, VG->OM1 50ost, 54m LIMA->LAD 99p/d - atretic, RCA dom, nl; f.cath 10/16: 40-50%(FFR 0.90) pLAD, 75% (FFR 0.77) mLAD s/p PCI/DES, oRCA 40% (FFR0.95)  . Cancer ([place]    SKIN CANCER ON BACK  . Celiac disease   . Chronic diastolic CHF (congestive heart failure) ([place]    a. 06/2009 Echo: EF 60-65%, Gr 1 DD, triv AI, mildly dil LA, nl RV.  .Marland KitchenCOPD (chronic obstructive pulmonary disease) ([place]airbanks [place]    a. Chronic bronchitis and emphysema.  . DDD (degenerative disc disease), lumbar   . Diverticulosis   . Dysrhythmia   . Essential hypertension   . GERD (gastroesophageal reflux disease)   . History of hiatal hernia   . History of kidney stones    H/O  . History of tobacco  abuse    a. Quit 2014.  .Marland KitchenMyocardial infarction (HBurnside 2002   4 STENTS  . Pancreatitis   . PSVT (paroxysmal supraventricular tachycardia) ([place]rujillo [place]    a. 10/2012 Noted on Zio Patch.  . Sleep apnea    LOST CORD TO CPAP -ONLY 02 @ BEDTIME  . Tubular adenoma of colon   . Type II diabetes mellitus (H[facility])     Medications:  Apixaban 5 mg BID prior to admission (patient reported last dose was 11/21 at 0900)  Assessment: Patient is a 64y/o M with medical history as above who presented to the ED 11/21 with chest pain. Troponin flat around 12. Pharmacy has been consulted to initiate heparin infusion for suspected ACS.  Baseline INR 1. Baseline aPTT and heparin level pending. Baseline CBC  notable for hemoglobin of 18.9.   11/22 0253 aPTT >160 sec, held x 1hr, decreased to 900 units/hr 11/22 1227 aPTT 68 sec, therapeutic x 1  Goal of Therapy:  aPTT 66 - 102 seconds Monitor platelets by anticoagulation protocol: Yes   Plan:  11/22 1227 aPTT 68 sec, HL 0.76 - therapeutic aPTT x 1, HL is still elevated due to prior apixaban. Will continue heparin @ 900 units/hr. Will recheck aPTT in 6 hrs. Will check HL with AM labs and continue to use aPTT monitoring until HL and aPTT correlate. Daily CBC while on Heparin drip.    Paulina Fusi, PharmD, BCPS 10/07/2020 2:41 PM

## 2020-10-07 NOTE — Consult Note (Signed)
CARDIOLOGY CONSULT NOTE               Patient ID: Lawrence [name] MRN: [id number] DOB/AGE: 1955-12-09 64 y.o.  Admit date: 10/06/2020 Referring Physician Dr Marzetta Board hospitalist Primary Physician Dr. Juanetta Beets primary Primary Cardiologist Dr. Jordan Hawks cardiologist Reason for Consultation chronic chest pain  HPI: Patient is a 64 year old male multiple medical problems paroxysmal A. fib hypertension coronary disease coronary bypass surgery multiple stents chronic diastolic congestive heart failure COPD emphysema tobacco abuse but quit history of diabetes history of chronic chest pain and back pain.  Has had longstanding narcotic use and now states he is only responding to IV morphine for his pain and nothing else will work patient had a cath in April which showed patent stents but mildly depressed left ventricular function of 40 to 45% which was unchanged from his cath in July 2020 during the time the patient was having a similar symptom disease having now patient was treated medically but has had multiple hospital visits for chronic chest pain only responsive to morphine he ruled out for myocardial infarction again EKG is unchanged but states to be having severe chest pain symptomatically with no objective findings.  With his multiple ER visits and hospitalizations becoming increasingly concerning that there may be a chemical dependent see problem or drug-seeking behavior for narcotics partly based on his chronic back pain symptoms and now he is describing chest pain that is not amenable to anything percutaneous or mechanical and is not responding to Imdur or Ranexa beta-blockers ACE inhibitor statins aspirin or Eliquis.  Review of systems complete and found to be negative unless listed above     Past Medical History:  Diagnosis Date  . A-fib ([place])   . Anemia   . Anginal pain ([place])   . Anxiety   . Arthritis   . Asthma   . CAD (coronary artery disease)    a. 2002 CABGx2  (LIMA->LAD, VG->VG->OM1);  b. 09/2012 DES->OM;  c. 03/2015 PTCA of LAD [facility]) in setting of atretic LIMA; d. 05/2015 Cath [facility]): nonobs dzs; e. 06/2015 Cath ([facility]): LM nl, LAD 45p/d ISR, 50d, D1/2 small, LCX 50p/d ISR, OM1 70ost, 30 ISR, VG->OM1 50ost, 35m LIMA->LAD 99p/d - atretic, RCA dom, nl; f.cath 10/16: 40-50%(FFR 0.90) pLAD, 75% (FFR 0.77) mLAD s/p PCI/DES, oRCA 40% (FFR0.95)  . Cancer ([place]    SKIN CANCER ON BACK  . Celiac disease   . Chronic diastolic CHF (congestive heart failure) ([place]    a. 06/2009 Echo: EF 60-65%, Gr 1 DD, triv AI, mildly dil LA, nl RV.  .Marland KitchenCOPD (chronic obstructive pulmonary disease) ([place]    a. Chronic bronchitis and emphysema.  . DDD (degenerative disc disease), lumbar   . Diverticulosis   . Dysrhythmia   . Essential hypertension   . GERD (gastroesophageal reflux disease)   . History of hiatal hernia   . History of kidney stones    H/O  . History of tobacco abuse    a. Quit 2014.  .Marland KitchenMyocardial infarction ([place] 2002   4 STENTS  . Pancreatitis   . PSVT (paroxysmal supraventricular tachycardia) ([place]    a. 10/2012 Noted on Zio Patch.  . Sleep apnea    LOST CORD TO CPAP -ONLY 02 @ BEDTIME  . Tubular adenoma of colon   . Type II diabetes mellitus ([place]     Past Surgical History:  Procedure Laterality Date  . BYPASS GRAFT    . CARDIAC CATHETERIZATION N/A 07/12/2015  rocedure: Left Heart Cath and Cors/Grafts Angiography;  Surgeon: Belva Crome, MD;  Location: [place] CV LAB;  Service: Cardiovascular;  Laterality: N/A;  . CARDIAC CATHETERIZATION Right 10/07/2015   Procedure: Left Heart Cath and Cors/Grafts Angiography;  Surgeon: Dionisio David, MD;  Location: [place] CV LAB;  Service: Cardiovascular;  Laterality: Right;  . CARDIAC CATHETERIZATION N/A 04/06/2016   Procedure: Left Heart Cath and Coronary Angiography;  Surgeon: Yolonda Kida, MD;  Location: [place] CV LAB;  Service: Cardiovascular;  Laterality: N/A;  . CARDIAC CATHETERIZATION   04/06/2016   Procedure: Bypass Graft Angiography;  Surgeon: Yolonda Kida, MD;  Location: [place] CV LAB;  Service: Cardiovascular;;  . CARDIAC CATHETERIZATION N/A 11/02/2016   Procedure: Left Heart Cath and Cors/Grafts Angiography and possible PCI;  Surgeon: Yolonda Kida, MD;  Location: [place] CV LAB;  Service: Cardiovascular;  Laterality: N/A;  . CARDIAC CATHETERIZATION N/A 11/02/2016   Procedure: Coronary Stent Intervention;  Surgeon: Yolonda Kida, MD;  Location: [place] CV LAB;  Service: Cardiovascular;  Laterality: N/A;  . CHOLECYSTECTOMY    . CIRCUMCISION N/A 06/09/2019   Procedure: CIRCUMCISION ADULT;  Surgeon: Billey Co, MD;  Location: [facility] ORS;  Service: Urology;  Laterality: N/A;  . COLONOSCOPY WITH PROPOFOL N/A 04/01/2018   Procedure: COLONOSCOPY WITH PROPOFOL;  Surgeon: Manya Silvas, MD;  Location: [facility] ENDOSCOPY;  Service: Endoscopy;  Laterality: N/A;  . ESOPHAGEAL DILATION    . ESOPHAGOGASTRODUODENOSCOPY (EGD) WITH PROPOFOL N/A 04/01/2018   Procedure: ESOPHAGOGASTRODUODENOSCOPY (EGD) WITH PROPOFOL;  Surgeon: Manya Silvas, MD;  Location: [facility] ENDOSCOPY;  Service: Endoscopy;  Laterality: N/A;  . LEFT HEART CATH AND CORS/GRAFTS ANGIOGRAPHY N/A 06/12/2019   Procedure: LEFT HEART CATH AND CORS/GRAFTS ANGIOGRAPHY;  Surgeon: Teodoro Spray, MD;  Location: [place] CV LAB;  Service: Cardiovascular;  Laterality: N/A;  . LEFT HEART CATH AND CORS/GRAFTS ANGIOGRAPHY N/A 03/11/2020   Procedure: LEFT HEART CATH AND CORS/GRAFTS ANGIOGRAPHY;  Surgeon: Isaias Cowman, MD;  Location: [place] CV LAB;  Service: Cardiovascular;  Laterality: N/A;  . TONSILLECTOMY    . VASCULAR SURGERY      Medications Prior to Admission  Medication Sig Dispense Refill Last Dose  . albuterol (VENTOLIN HFA) 108 (90 Base) MCG/ACT inhaler INHALE 2 PUFFS BY MOUTH EVERY 6 HOURS AS NEEDED FOR SHORTNESS OF BREATH (Patient taking differently: Inhale 2 puffs into the  lungs every 6 (six) hours as needed for wheezing or shortness of breath. ) 18 g 5 prn at prn  . allopurinol (ZYLOPRIM) 300 MG tablet TAKE 1 TABLET BY MOUTH TWICE A DAY (Patient taking differently: Take 300 mg by mouth 2 (two) times daily. ) 180 tablet 0 10/06/2020 at Unknown time  . ALPRAZolam (XANAX) 1 MG tablet TAKE ONE TABLET BY MOUTH THREE TIMES A DAY (Patient taking differently: Take 1 mg by mouth 3 (three) times daily. ) 90 tablet 3 10/06/2020 at Unknown time  . amLODipine (NORVASC) 2.5 MG tablet Take 1 tablet (2.5 mg total) by mouth daily. 90 tablet 3 10/06/2020 at Unknown time  . aspirin 81 MG chewable tablet Chew 81 mg by mouth daily.   10/06/2020 at Unknown time  . atorvastatin (LIPITOR) 80 MG tablet TAKE ONE TABLET BY MOUTH AT BEDTIME (Patient taking differently: Take 80 mg by mouth at bedtime. ) 90 tablet 1 10/05/2020 at Unknown time  . BREO ELLIPTA 100-25 MCG/INH AEPB INHALE 1 PUFF INTO THE LUNGS DAILY (Patient taking differently: Inhale 1 puff into the  lungs daily. ) 60 each 5 10/06/2020 at Unknown time  . budesonide (PULMICORT) 0.5 MG/2ML nebulizer solution Take 0.5 mg by nebulization 2 (two) times daily as needed (shortness of breath or wheezing).    prn at prn  . cetirizine (ZYRTEC) 10 MG tablet Take 1 tablet (10 mg total) by mouth at bedtime. 90 tablet 1 10/05/2020 at Unknown time  . Cholecalciferol (VITAMIN D3) 125 MCG (5000 UT) CAPS Take 5,000 Units by mouth daily.    10/06/2020 at Unknown time  . docusate sodium (COLACE) 100 MG capsule Take 100 mg by mouth daily.    10/06/2020 at Unknown time  . ELIQUIS 5 MG TABS tablet TAKE 1 TABLET BY MOUTH TWICE A DAY 180 tablet 3 10/06/2020 at 0800  . FARXIGA 10 MG TABS tablet TAKE 1 TABLET BY MOUTH DAILY (Patient taking differently: Take 10 mg by mouth daily. ) 30 tablet 12 10/06/2020 at Unknown time  . isosorbide mononitrate (IMDUR) 120 MG 24 hr tablet TAKE 1 TABLET BY MOUTH DAILY (Patient taking differently: Take 120 mg by mouth daily. ) 90  tablet 0 10/06/2020 at Unknown time  . LINZESS 72 MCG capsule Take 72 mcg by mouth daily before breakfast.    10/06/2020 at Unknown time  . lisinopril (ZESTRIL) 10 MG tablet TAKE 1 TABLET BY MOUTH DAILY (Patient taking differently: Take 10 mg by mouth daily. ) 90 tablet 4 10/06/2020 at Unknown time  . magnesium oxide (MAG-OX) 400 (241.3 Mg) MG tablet Take 1 tablet (400 mg total) by mouth every morning. 30 tablet 5 10/06/2020 at Unknown time  . metaxalone (SKELAXIN) 800 MG tablet Take 1 tablet (800 mg total) by mouth 3 (three) times daily as needed (pain). 30 tablet 3 prn at prn  . metFORMIN (GLUCOPHAGE-XR) 500 MG 24 hr tablet Take 1 tablet (500 mg total) by mouth daily with breakfast. 30 tablet 2 10/06/2020 at Unknown time  . naloxone (NARCAN) nasal spray 4 mg/0.1 mL 1 spray into nostril x1 and may repeat every 2-3 minutes until patient is responsive or EMS arrives 2 each 2 prn at prn  . nitroGLYCERIN (NITROSTAT) 0.4 MG SL tablet PLACE ONE TABLET UNDER THE TONGUE EVERY 5 MINUTES FOR CHEST PAIN. IF NO RELIEF PAST 3RD TAB GO TO EMERGENCY ROOM (Patient taking differently: Place 0.4 mg under the tongue every 5 (five) minutes as needed for chest pain. ) 30 tablet 3 prn at prn  . omega-3 acid ethyl esters (LOVAZA) 1 g capsule TAKE 4 CAPSULES BY MOUTH DAILY (Patient taking differently: Take 4 g by mouth daily. ) 120 capsule 12 10/06/2020 at Unknown time  . omeprazole (PRILOSEC) 40 MG capsule Take 40 mg by mouth daily.    10/06/2020 at Unknown time  . oxyCODONE-acetaminophen (PERCOCET) 10-325 MG tablet TAKE 1 TABLET BY MOUTH EVERY 4 HOURS AS NEEDED FOR PAIN (Patient taking differently: Take 1 tablet by mouth every 4 (four) hours as needed for pain. ) 150 tablet 0 prn at prn  . ranolazine (RANEXA) 1000 MG SR tablet TAKE 1 TABLET BY MOUTH TWICE A DAY (Patient taking differently: Take 1,000 mg by mouth 2 (two) times daily. ) 60 tablet 5 10/06/2020 at Unknown time  . sotalol (BETAPACE) 80 MG tablet Take 80 mg by  mouth 2 (two) times daily.   10/06/2020 at Unknown time  . tiotropium (SPIRIVA) 18 MCG inhalation capsule Place 18 mcg into inhaler and inhale daily.   10/06/2020 at Unknown time  . torsemide (DEMADEX) 100 MG tablet Take 0.5 tablets (  50 mg total) by mouth daily. 30 tablet 3 10/06/2020 at Unknown time  . traZODone (DESYREL) 150 MG tablet TAKE ONE TABLET BY MOUTH AT BEDTIME (Patient taking differently: Take 150 mg by mouth at bedtime. ) 30 tablet 1 10/05/2020 at Unknown time  . venlafaxine XR (EFFEXOR-XR) 75 MG 24 hr capsule TAKE 1 CAPSULE BY MOUTH DAILY WITH BREAKFAST (Patient taking differently: Take 75 mg by mouth daily with breakfast. ) 90 capsule 4 10/06/2020 at Unknown time   Social History   Socioeconomic History  . Marital status: Widowed    Spouse name: Not on file  . Number of children: 1  . Years of education: Not on file  . Highest education level: 10th grade  Occupational History  . Occupation: Disabled  . Occupation: on Fish farm manager  Tobacco Use  . Smoking status: Former Smoker    Packs/day: 3.00    Years: 50.00    Pack years: 150.00    Types: Cigarettes    Quit date: 04/22/2013    Years since quitting: 7.4  . Smokeless tobacco: Never Used  Vaping Use  . Vaping Use: Never used  Substance and Sexual Activity  . Alcohol use: No    Comment: remotely quit alcohol use. Hx of heavy alcohol use.  . Drug use: Yes    Types: Marijuana    Comment: occasionally  . Sexual activity: Not on file  Other Topics Concern  . Not on file  Social History Narrative   Pt lives in [place] with wife.  Does not routinely exercise.   Social Determinants of Health   Financial Resource Strain: Low Risk   . Difficulty of Paying Living Expenses: Not hard at all  Food Insecurity: No Food Insecurity  . Worried About Charity fundraiser in the Last Year: Never true  . Ran Out of Food in the Last Year: Never true  Transportation Needs: No Transportation Needs  . Lack of Transportation  (Medical): No  . Lack of Transportation (Non-Medical): No  Physical Activity: Inactive  . Days of Exercise per Week: 0 days  . Minutes of Exercise per Session: 0 min  Stress: No Stress Concern Present  . Feeling of Stress : Only a little  Social Connections: Moderately Isolated  . Frequency of Communication with Friends and Family: More than three times a week  . Frequency of Social Gatherings with Friends and Family: More than three times a week  . Attends Religious Services: More than 4 times per year  . Active Member of Clubs or Organizations: No  . Attends Archivist Meetings: Never  . Marital Status: Widowed  Intimate Partner Violence: Not At Risk  . Fear of Current or Ex-Partner: No  . Emotionally Abused: No  . Physically Abused: No  . Sexually Abused: No    Family History  Problem Relation Age of Onset  . Heart attack Mother   . Depression Mother   . Heart disease Mother   . COPD Mother   . Hypertension Mother   . Heart attack Father   . Diabetes Father   . Depression Father   . Heart disease Father   . Cirrhosis Father   . Parkinson's disease Brother       Review of systems complete and found to be negative unless listed above      PHYSICAL EXAM  General: Well developed, well nourished, in no acute distress HEENT:  Normocephalic and atramatic Neck:  No JVD.  Lungs: Clear bilaterally to auscultation  and percussion. Heart: HRRR . Normal S1 and S2 without gallops or murmurs.  Abdomen: Bowel sounds are positive, abdomen soft and non-tender  Msk:  Back normal, normal gait. Normal strength and tone for age. Extremities: No clubbing, cyanosis or edema.   Neuro: Alert and oriented X 3. Psych:  Good affect, responds appropriately  Labs:   Lab Results  Component Value Date   WBC 6.1 10/07/2020   HGB 16.2 10/07/2020   HCT 49.3 10/07/2020   MCV 95.4 10/07/2020   PLT 153 10/07/2020    Recent Labs  Lab 10/07/20 0253  NA 141  K 4.5  CL 101  CO2  33*  BUN 11  CREATININE 0.87  CALCIUM 8.9  PROT 5.8*  BILITOT 0.6  ALKPHOS 53  ALT 15  AST 15  GLUCOSE 173*   Lab Results  Component Value Date   CKTOTAL 93 05/23/2014   CKMB 1.7 05/23/2014   TROPONINI <0.03 12/15/2018    Lab Results  Component Value Date   CHOL 215 (H) 09/27/2020   CHOL 170 03/08/2020   CHOL 110 09/09/2019   Lab Results  Component Value Date   HDL 29 (L) 09/27/2020   HDL 28 (L) 03/08/2020   HDL 31 (L) 09/09/2019   Lab Results  Component Value Date   LDLCALC UNABLE TO CALCULATE IF TRIGLYCERIDE OVER 400 mg/dL 09/27/2020   LDLCALC UNABLE TO CALCULATE IF TRIGLYCERIDE OVER 400 mg/dL 03/08/2020   LDLCALC 44 09/09/2019   Lab Results  Component Value Date   TRIG 488 (H) 09/27/2020   TRIG 514 (H) 03/08/2020   TRIG 175 (H) 09/09/2019   Lab Results  Component Value Date   CHOLHDL 7.4 09/27/2020   CHOLHDL 6.1 03/08/2020   CHOLHDL 3.5 09/09/2019   Lab Results  Component Value Date   LDLDIRECT 122.1 (H) 09/27/2020   LDLDIRECT 84.1 03/08/2020   LDLDIRECT 58 05/29/2019      Radiology: DG Chest 2 View  Result Date: 10/06/2020 CLINICAL DATA:  Chest pain EXAM: CHEST - 2 VIEW COMPARISON:  September 26, 2020 FINDINGS: Cardiomediastinal silhouette is normal. No pneumothorax. The hila and mediastinum are normal. No focal infiltrates. No overt edema. IMPRESSION: No active cardiopulmonary disease. Electronically Signed   By: Dorise Bullion III M.D   On: 10/06/2020 17:38   DG Chest 2 View  Result Date: 09/26/2020 CLINICAL DATA:  Chest pain EXAM: CHEST - 2 VIEW COMPARISON:  08/10/2020 FINDINGS: Post sternotomy changes. No focal opacity or pleural effusion. Stable cardiomediastinal silhouette with aortic atherosclerosis. No pneumothorax. IMPRESSION: No active cardiopulmonary disease. Electronically Signed   By: Donavan Foil M.D.   On: 09/26/2020 18:54   NM Myocar Multi W/Spect W/Wall Motion / EF  Result Date: 09/28/2020  Blood pressure demonstrated a normal  response to exercise.  There was no ST segment deviation noted during stress.  No T wave inversion was noted during stress.  This is an intermediate risk study.  Findings consistent with prior myocardial infarction.  The left ventricular ejection fraction is moderately decreased (30-44%).  Nuclear stress EF: 44%.  Adequate chemical stress Anterior apical septal hypokinesis Await nuclear images   CT ANGIO CHEST AORTA W/CM &/OR WO/CM  Result Date: 10/07/2020 CLINICAL DATA:  Chest pain, back pain.  Evaluate for dissection. EXAM: CT ANGIOGRAPHY CHEST WITH CONTRAST TECHNIQUE: Multidetector CT imaging of the chest was performed using the standard protocol during bolus administration of intravenous contrast. Multiplanar CT image reconstructions and MIPs were obtained to evaluate the vascular anatomy. CONTRAST:  70m  OMNIPAQUE IOHEXOL 350 MG/ML SOLN COMPARISON:  08/11/2020 FINDINGS: Cardiovascular: No evidence of aortic aneurysm or dissection. Diffuse coronary artery and aortic atherosclerosis. Prior CABG. Heart is normal size. No large central pulmonary emboli. Mediastinum/Nodes: No mediastinal, hilar, or axillary adenopathy. Trachea and esophagus are unremarkable. Thyroid unremarkable. Lungs/Pleura: Lungs are clear. No focal airspace opacities or suspicious nodules. No effusions. Upper Abdomen: Imaging into the upper abdomen demonstrates no acute findings. Musculoskeletal: Chest wall soft tissues are unremarkable. No acute bony abnormality. Review of the MIP images confirms the above findings. IMPRESSION: No aneurysm or dissection. No acute cardiopulmonary disease. Aortic Atherosclerosis (ICD10-I70.0). Electronically Signed   By: Rolm Baptise M.D.   On: 10/07/2020 11:21   ECHOCARDIOGRAM COMPLETE  Result Date: 09/28/2020    ECHOCARDIOGRAM REPORT   Patient Name:   Lawrence [name] [facility] Date of Exam: 09/28/2020 Medical Rec #:  [id number]       Height:       67.0 in Accession #:    [id number]      Weight:       216.5  lb Date of Birth:  1956-08-08       BSA:          2.092 m Patient Age:    57 years        BP:           114/71 mmHg Patient Gender: M               HR:           61 bpm. Exam Location:  [facility] Procedure: 2D Echo and Intracardiac Opacification Agent Indications:     NSTEMI I21.4  History:         Patient has prior history of Echocardiogram examinations, most                  recent 03/08/2020.  Sonographer:     Arville Go RDCS Referring Phys:  [id number] Val Riles Diagnosing Phys: Yolonda Kida MD  Sonographer Comments: Technically difficult study due to poor echo windows. Image acquisition challenging due to patient body habitus. IMPRESSIONS  1. Left ventricular ejection fraction, by estimation, is 45 to 50%. The left ventricle has mildly decreased function. The left ventricle demonstrates regional wall motion abnormalities (see scoring diagram/findings for description). The left ventricular  internal cavity size was mildly dilated. Left ventricular diastolic parameters were normal.  2. Right ventricular systolic function is normal. The right ventricular size is normal.  3. Left atrial size was mildly dilated.  4. Right atrial size was mildly dilated.  5. The mitral valve is normal in structure. Trivial mitral valve regurgitation.  6. The aortic valve is grossly normal. Aortic valve regurgitation is trivial. FINDINGS  Left Ventricle: Left ventricular ejection fraction, by estimation, is 45 to 50%. The left ventricle has mildly decreased function. The left ventricle demonstrates regional wall motion abnormalities. Definity contrast agent was given IV to delineate the left ventricular endocardial borders. The left ventricular internal cavity size was mildly dilated. There is no left ventricular hypertrophy. Left ventricular diastolic parameters were normal.  LV Wall Scoring: The mid anteroseptal segment and apex are hypokinetic. Right Ventricle: The right ventricular size is normal. No increase in right  ventricular wall thickness. Right ventricular systolic function is normal. Left Atrium: Left atrial size was mildly dilated. Right Atrium: Right atrial size was mildly dilated. Pericardium: There is no evidence of pericardial effusion. Mitral Valve: The mitral valve is normal in structure. Trivial mitral valve regurgitation. Tricuspid  Valve: The tricuspid valve is normal in structure. Tricuspid valve regurgitation is mild. Aortic Valve: The aortic valve is grossly normal. Aortic valve regurgitation is trivial. Aortic valve peak gradient measures 6.2 mmHg. Pulmonic Valve: The pulmonic valve was grossly normal. Pulmonic valve regurgitation is not visualized. Aorta: The aortic root is normal in size and structure. IAS/Shunts: No atrial level shunt detected by color flow Doppler.  LEFT VENTRICLE PLAX 2D LVIDd:         5.08 cm  Diastology LVIDs:         3.59 cm  LV e' medial:    6.64 cm/s LV PW:         1.32 cm  LV E/e' medial:  6.9 LV IVS:        1.15 cm  LV e' lateral:   9.46 cm/s LVOT diam:     1.90 cm  LV E/e' lateral: 4.8 LV SV:         64 LV SV Index:   31 LVOT Area:     2.84 cm  RIGHT VENTRICLE RV Basal diam:  3.66 cm RV S prime:     8.59 cm/s TAPSE (M-mode): 1.8 cm LEFT ATRIUM             Index       RIGHT ATRIUM           Index LA diam:        4.20 cm 2.01 cm/m  RA Area:     18.60 cm LA Vol (A2C):   50.3 ml 24.05 ml/m RA Volume:   58.50 ml  27.97 ml/m LA Vol (A4C):   36.8 ml 17.59 ml/m LA Biplane Vol: 46.4 ml 22.18 ml/m  AORTIC VALVE                PULMONIC VALVE AV Area (Vmax): 2.52 cm    PV Vmax:       0.81 m/s AV Vmax:        124.00 cm/s PV Peak grad:  2.7 mmHg AV Peak Grad:   6.2 mmHg LVOT Vmax:      110.00 cm/s LVOT Vmean:     69.000 cm/s LVOT VTI:       0.227 m  AORTA Ao Root diam: 3.00 cm Ao Asc diam:  3.30 cm MITRAL VALVE MV Area (PHT): 2.71 cm    SHUNTS MV Decel Time: 280 msec    Systemic VTI:  0.23 m MV E velocity: 45.80 cm/s  Systemic Diam: 1.90 cm MV A velocity: 43.30 cm/s MV E/A ratio:   1.06 Kazumi Lachney D Nishita Isaacks MD Electronically signed by Yolonda Kida MD Signature Date/Time: 09/28/2020/1:28:30 PM    Final     EKG: Normal sinus rhythm nonspecific diffuse tenderness   ASSESSMENT AND PLAN:  Atypical CP Probably non cardiac CAD HTN Chronic pain Hx COPD Paroxysmal atrial fibrillation Angina Previous history of smoking Diabetes Hyperlipidemia GERD History of coronary bypass surgery . Plan Agree with observation on telemetry rule out myocardial infarction Follow-up EKGs troponins to be sure that they are negative Atrial fibrillation paroxysmal patient maintained on anticoagulation History of COPD continue inhalers as necessary Hypertension reasonably controlled on beta-blocker Diabetes continue current management and care Hyperlipidemia continue Lipitor therapy for lipid management Maintain Imdur Ranexa beta-blockers ACE inhibitor amlodipine aspirin statin for multivessel coronary disease with possible angina Persistent chest pain appears to be noncardiac with a negative Myoview earlier this month and a negative cath earlier this year I do not recommend treating his pain with  IV morphine I am concerned this is a drug-seeking type behavior and he needs to be seen and evaluated by pain clinic. I do not recommend repeat cardiac cath as he recently had one done a few months ago for the same symptoms and was unchanged and intervention was deferred I recommend no further cardiac evaluation and resume Eliquis and aspirin   Signed: Yolonda Kida MD 10/07/2020, 12:24 PM

## 2020-10-07 NOTE — Consult Note (Signed)
ANTICOAGULATION CONSULT NOTE  Pharmacy Consult for Heparin Infusion Indication: chest pain/ACS  Patient Measurements: Heparin Dosing Weight: 87.4 kg   Labs: Recent Labs    10/06/20 1704 10/06/20 1953 10/06/20 2256 10/07/20 0253  HGB 18.9*  --   --  16.2  HCT 55.8*  --   --  49.3  PLT 173  --   --  153  APTT  --   --  >160* >160*  LABPROT 12.9  --   --   --   INR 1.0  --   --   --   HEPARINUNFRC  --   --  2.16* 1.48*  CREATININE 0.83  --   --  0.87  TROPONINIHS 13 12 13   --     Estimated Creatinine Clearance: 93.4 mL/min (by C-G formula based on SCr of 0.87 mg/dL).   Medical History: Past Medical History:  Diagnosis Date  . A-fib ([place])   . Anemia   . Anginal pain ([place])   . Anxiety   . Arthritis   . Asthma   . CAD (coronary artery disease)    a. 2002 CABGx2 (LIMA->LAD, VG->VG->OM1);  b. 09/2012 DES->OM;  c. 03/2015 PTCA of LAD [facility]) in setting of atretic LIMA; d. 05/2015 Cath [facility]): nonobs dzs; e. 06/2015 Cath ([facility]): LM nl, LAD 45p/d ISR, 50d, D1/2 small, LCX 50p/d ISR, OM1 70ost, 30 ISR, VG->OM1 50ost, 40m LIMA->LAD 99p/d - atretic, RCA dom, nl; f.cath 10/16: 40-50%(FFR 0.90) pLAD, 75% (FFR 0.77) mLAD s/p PCI/DES, oRCA 40% (FFR0.95)  . Cancer ([place]    SKIN CANCER ON BACK  . Celiac disease   . Chronic diastolic CHF (congestive heart failure) ([place]    a. 06/2009 Echo: EF 60-65%, Gr 1 DD, triv AI, mildly dil LA, nl RV.  .Marland KitchenCOPD (chronic obstructive pulmonary disease) ([place]    a. Chronic bronchitis and emphysema.  . DDD (degenerative disc disease), lumbar   . Diverticulosis   . Dysrhythmia   . Essential hypertension   . GERD (gastroesophageal reflux disease)   . History of hiatal hernia   . History of kidney stones    H/O  . History of tobacco abuse    a. Quit 2014.  .Marland KitchenMyocardial infarction ([place] 2002   4 STENTS  . Pancreatitis   . PSVT (paroxysmal supraventricular tachycardia) ([place]    a. 10/2012 Noted on Zio Patch.  . Sleep apnea    LOST CORD TO CPAP -ONLY 02  @ BEDTIME  . Tubular adenoma of colon   . Type II diabetes mellitus (HCC)     Medications:  Apixaban 5 mg BID prior to admission (patient reported last dose was 11/21 at 0900)  Assessment: Patient is a 64y/o M with medical history as above who presented to the ED 11/21 with chest pain. Troponin flat around 12. Pharmacy has been consulted to initiate heparin infusion for suspected ACS.  Baseline INR 1. Baseline aPTT and heparin level pending. Baseline CBC notable for hemoglobin of 18.9.   Goal of Therapy:  aPTT 66 - 102 seconds Monitor platelets by anticoagulation protocol: Yes   Plan:  11/22: aPTT @ 0253 = > 160 Will hold heparin gtt for 1 hr and restart heparin @ 900 units/hr. Will recheck HL 6 hrs after restart.   Jaymeson Mengel D 10/07/2020,5:21 AM

## 2020-10-07 NOTE — Consult Note (Addendum)
ANTICOAGULATION CONSULT NOTE  Pharmacy Consult for Heparin Infusion Indication: chest pain/ACS  Patient Measurements: Heparin Dosing Weight: 87.4 kg   Labs: Recent Labs    10/06/20 1704 10/06/20 1953 10/06/20 2256 10/06/20 2256 10/07/20 0253 10/07/20 0459 10/07/20 0656 10/07/20 1227 10/07/20 1808  HGB 18.9*  --   --   --  16.2  --   --   --   --   HCT 55.8*  --   --   --  49.3  --   --   --   --   PLT 173  --   --   --  153  --   --   --   --   APTT  --   --  >160*   < > >160*  --   --  68* 55*  LABPROT 12.9  --   --   --   --   --   --   --   --   INR 1.0  --   --   --   --   --   --   --   --   HEPARINUNFRC  --   --  2.16*  --  1.48*  --   --  0.76*  --   CREATININE 0.83  --   --   --  0.87  --   --   --   --   TROPONINIHS 13   < > 13  --   --  10 11  --   --    < > = values in this interval not displayed.    Estimated Creatinine Clearance: 93.8 mL/min (by C-G formula based on SCr of 0.87 mg/dL).   Medical History: Past Medical History:  Diagnosis Date   A-fib ([place])    Anemia    Anginal pain ([place])    Anxiety    Arthritis    Asthma    CAD (coronary artery disease)    a. 2002 CABGx2 (LIMA->LAD, VG->VG->OM1);  b. 09/2012 DES->OM;  c. 03/2015 PTCA of LAD [facility]) in setting of atretic LIMA; d. 05/2015 Cath [facility]): nonobs dzs; e. 06/2015 Cath ([facility]): LM nl, LAD 45p/d ISR, 50d, D1/2 small, LCX 50p/d ISR, OM1 70ost, 30 ISR, VG->OM1 50ost, 26m LIMA->LAD 99p/d - atretic, RCA dom, nl; f.cath 10/16: 40-50%(FFR 0.90) pLAD, 75% (FFR 0.77) mLAD s/p PCI/DES, oRCA 40% (FFR0.95)   Cancer (HCC)    SKIN CANCER ON BACK   Celiac disease    Chronic diastolic CHF (congestive heart failure) ([place]    a. 06/2009 Echo: EF 60-65%, Gr 1 DD, triv AI, mildly dil LA, nl RV.   COPD (chronic obstructive pulmonary disease) ([place]    a. Chronic bronchitis and emphysema.   DDD (degenerative disc disease), lumbar    Diverticulosis    Dysrhythmia    Essential hypertension    GERD  (gastroesophageal reflux disease)    History of hiatal hernia    History of kidney stones    H/O   History of tobacco abuse    a. Quit 2014.   Myocardial infarction ([facility] 2002   4 STENTS   Pancreatitis    PSVT (paroxysmal supraventricular tachycardia) ([place]    a. 10/2012 Noted on Zio Patch.   Sleep apnea    LOST CORD TO CPAP -ONLY 02 @ BEDTIME   Tubular adenoma of colon    Type II diabetes mellitus (HCC)     Medications:  Apixaban 5 mg BID prior to admission (  patient reported last dose was 11/21 at 0900)  Assessment: Patient is a 64 y/o M with medical history as above who presented to the ED 11/21 with chest pain. Troponin flat around 12. Pharmacy has been consulted to initiate heparin infusion for suspected ACS.  Baseline INR 1, baseline aPTT > 160 (drawn after initiation of infusion), baseline heparin level 2.16 (drawn after initiation of infusion). Baseline CBC notable for hemoglobin of 18.9.   11/22 0253 aPTT >160 sec, held x 1hr, decreased to 900 units/hr 11/22 1227 aPTT 68 sec, therapeutic x 1 11/22 1808 aPTT 55 sec, subtherapeutic   Goal of Therapy:  aPTT 66 - 102 seconds Monitor platelets by anticoagulation protocol: Yes   Plan:  --11/22 at 1808 aPTT 55 sec, subtherapeutic. Will increase heparin infusion to 1050 units/hr --Re-check aPTT 6 hours after rate change. Continue to follow aPTT until correlation is established with anti-Xa level --Daily CBC per protocol while on heparin drip   [name] 10/07/2020 7:03 PM

## 2020-10-07 NOTE — Progress Notes (Signed)
Mobility Specialist - Progress Note   10/07/20 1400  Mobility  Activity Ambulated in room  Level of Assistance Standby assist, set-up cues, supervision of patient - no hands on  Assistive Device None  Distance Ambulated (ft) 60 ft  Mobility Response Tolerated well  Mobility performed by Mobility specialist  $Mobility charge 1 Mobility    Pre-mobility: 51 HR, 96% SpO2 Post-mobility: 54 HR, 93% SpO2   Pt was lying in bed upon arrival utilizing 3L [place] O2. Pt agreed to session. Pt c/o pain in chest "8.5/10" and back area "7/10". Per discussion with nurse, mobility was given the okay to proceed with activity. Pt was able to get EOB modI. Pt c/o dizziness upon sitting which resolved on its own. Pt progressed to ambulation without AD. No LOB noted. Pt denied SOB. Pt ambulated ~50' in room before c/o dizziness and voicing need to rest. Further mobility deferred d/t fatigue. Pt returned EOB-supine with supervision. Pt stated that activity increased chest pain to a 9/10. Overall, pt tolerated session well. Pt was left in bed with needs in reach and alarm set. Nurse entered at the end of session and was notified.    Kathee Delton Mobility Specialist 10/07/20, 2:25 PM

## 2020-10-07 NOTE — Progress Notes (Signed)
PROGRESS NOTE    Lawrence [name]  TDH:[id number] DOB: 1956-03-05 DOA: 10/06/2020 PCP: Birdie Sons, MD   Chief complaint.  Chest pain. Brief Narrative:  Lawrence [name] is a 64 y.o. male with medical history significant of paroxysmal A. fib, hypertension, coronary artery disease with history of CABG as well as several stents, chronic diastolic CHF, COPD / emphysema, tobacco abuse, in remission, DM 2, presents to the hospital with chief complaint of chest pain.  Patient reports that he started having sudden onset substernal pressure-like chest pain this morning around 11 AM upon getting up and starting to walk.  Chest pain radiates into his left shoulder and left arm. Serial troponin negative.  Patient still complaining of chest pain on 11/22.  Discussed with Dr. [name], not considering for heart cath.  Obtain CT angiogram to rule out aortic dissection as patient has persistent chest pain.   Assessment & Plan:   Active Problems:   Chest pain  #1.  Chest pain.  With history of coronary disease. Troponin has been negative.  However, patient chest pain seem to be persistent, not pleuritic. Discussed with cardiology, continued Ranexa, Imdur. Patient had a recent echocardiogram on 11/13, regional abnormality, ejection fraction 45 to 50%.  No pleural effusion. Due to persistent chest pain, I will obtain CT angiogram to rule out aortic dissection.  #2.  COPD, tobacco abuse. No exacerbation.  3.  Paroxysmal atrial fibrillation. Currently on heparin drip, will resume Eliquis if no additional cardiac work-up is indicated.  4.  Essential hypertension. Continue home medicines.    DVT prophylaxis: Heparin Code Status: Full Family Communication: None Disposition Plan:  .   Status is: Observation  The patient remains OBS appropriate and will d/c before 2 midnights.  Dispo: The patient is from: Home              Anticipated d/c is to: Home              Anticipated d/c date is: 1  day              Patient currently is not medically stable to d/c.        I/O last 3 completed shifts: In: 500 [IV Piggyback:500] Out: 350 [Urine:350] Total I/O In: -  Out: 275 [Urine:275]     Consultants:   Cardiology.  Procedures: None  Antimicrobials: None  Subjective: Patient states that he had a persistent chest pain overnight, the pain localized in the lower sternum, 8/10 in severity.  Constant.  Radiates to the left arm.  Pain is not worse with deep breath or cough.  Patient does not have a cough. No fever or chills. No abdominal pain or nausea vomiting. No dysuria hematuria.  Objective: Vitals:   10/06/20 2244 10/07/20 0326 10/07/20 0536 10/07/20 0737  BP: 133/80 128/72  128/77  Pulse: (!) 50 (!) 53  (!) 48  Resp: 16   17  Temp: 98.1 F (36.7 C) 97.8 F (36.6 C)  97.9 F (36.6 C)  TempSrc: Oral Oral  Oral  SpO2: 100% 100%  97%  Weight: 98.5 kg  99.3 kg   Height: 5' 7"  (1.702 m)       Intake/Output Summary (Last 24 hours) at 10/07/2020 1001 Last data filed at 10/07/2020 0740 Gross per 24 hour  Intake 500 ml  Output 625 ml  Net -125 ml   Filed Weights   10/06/20 1659 10/06/20 2244 10/07/20 0536  Weight: 98.4 kg 98.5 kg 99.3 kg  Examination:  General exam: Appears calm and comfortable  Respiratory system: Clear to auscultation. Respiratory effort normal. Cardiovascular system: S1 & S2 heard, RRR. No JVD, murmurs, rubs, gallops or clicks. No pedal edema. Gastrointestinal system: Abdomen is nondistended, soft and nontender. No organomegaly or masses felt. Normal bowel sounds heard. Central nervous system: Alert and oriented. No focal neurological deficits. Extremities: Symmetric 5 x 5 power. Skin: No rashes, lesions or ulcers Psychiatry: Judgement and insight appear normal. Mood & affect appropriate.  No chest wall tenderness.    Data Reviewed: I have personally reviewed following labs and imaging studies  CBC: Recent Labs  Lab  10/06/20 1704 10/07/20 0253  WBC 5.9 6.1  HGB 18.9* 16.2  HCT 55.8* 49.3  MCV 94.1 95.4  PLT 173 833   Basic Metabolic Panel: Recent Labs  Lab 10/06/20 1704 10/07/20 0253  NA 139 141  K 4.0 4.5  CL 100 101  CO2 31 33*  GLUCOSE 124* 173*  BUN 9 11  CREATININE 0.83 0.87  CALCIUM 8.8* 8.9   GFR: Estimated Creatinine Clearance: 93.8 mL/min (by C-G formula based on SCr of 0.87 mg/dL). Liver Function Tests: Recent Labs  Lab 10/07/20 0253  AST 15  ALT 15  ALKPHOS 53  BILITOT 0.6  PROT 5.8*  ALBUMIN 3.3*   No results for input(s): LIPASE, AMYLASE in the last 168 hours. No results for input(s): AMMONIA in the last 168 hours. Coagulation Profile: Recent Labs  Lab 10/06/20 1704  INR 1.0   Cardiac Enzymes: No results for input(s): CKTOTAL, CKMB, CKMBINDEX, TROPONINI in the last 168 hours. BNP (last 3 results) No results for input(s): PROBNP in the last 8760 hours. HbA1C: No results for input(s): HGBA1C in the last 72 hours. CBG: Recent Labs  Lab 10/07/20 0739  GLUCAP 194*   Lipid Profile: No results for input(s): CHOL, HDL, LDLCALC, TRIG, CHOLHDL, LDLDIRECT in the last 72 hours. Thyroid Function Tests: No results for input(s): TSH, T4TOTAL, FREET4, T3FREE, THYROIDAB in the last 72 hours. Anemia Panel: No results for input(s): VITAMINB12, FOLATE, FERRITIN, TIBC, IRON, RETICCTPCT in the last 72 hours. Sepsis Labs: No results for input(s): PROCALCITON, LATICACIDVEN in the last 168 hours.  Recent Results (from the past 240 hour(s))  Resp Panel by RT-PCR (Flu A&B, Covid) Nasopharyngeal Swab     Status: None   Collection Time: 10/06/20  9:38 PM   Specimen: Nasopharyngeal Swab; Nasopharyngeal(NP) swabs in vial transport medium  Result Value Ref Range Status   SARS Coronavirus 2 by RT PCR NEGATIVE NEGATIVE Final    Comment: (NOTE) SARS-CoV-2 target nucleic acids are NOT DETECTED.  The SARS-CoV-2 RNA is generally detectable in upper respiratory specimens during  the acute phase of infection. The lowest concentration of SARS-CoV-2 viral copies this assay can detect is 138 copies/mL. A negative result does not preclude SARS-Cov-2 infection and should not be used as the sole basis for treatment or other patient management decisions. A negative result may occur with  improper specimen collection/handling, submission of specimen other than nasopharyngeal swab, presence of viral mutation(s) within the areas targeted by this assay, and inadequate number of viral copies(<138 copies/mL). A negative result must be combined with clinical observations, patient history, and epidemiological information. The expected result is Negative.  Fact Sheet for Patients:  EntrepreneurPulse.com.au  Fact Sheet for Healthcare Providers:  IncredibleEmployment.be  This test is no t yet approved or cleared by the [place] FDA and  has been authorized for detection and/or diagnosis of SARS-CoV-2 by FDA under  an Emergency Use Authorization (EUA). This EUA will remain  in effect (meaning this test can be used) for the duration of the COVID-19 declaration under Section 564(b)(1) of the Act, 21 U.S.C.section 360bbb-3(b)(1), unless the authorization is terminated  or revoked sooner.       Influenza A by PCR NEGATIVE NEGATIVE Final   Influenza B by PCR NEGATIVE NEGATIVE Final    Comment: (NOTE) The Xpert Xpress SARS-CoV-2/FLU/RSV plus assay is intended as an aid in the diagnosis of influenza from Nasopharyngeal swab specimens and should not be used as a sole basis for treatment. Nasal washings and aspirates are unacceptable for Xpert Xpress SARS-CoV-2/FLU/RSV testing.  Fact Sheet for Patients: EntrepreneurPulse.com.au  Fact Sheet for Healthcare Providers: IncredibleEmployment.be  This test is not yet approved or cleared by the [place] FDA and has been authorized for detection and/or  diagnosis of SARS-CoV-2 by FDA under an Emergency Use Authorization (EUA). This EUA will remain in effect (meaning this test can be used) for the duration of the COVID-19 declaration under Section 564(b)(1) of the Act, 21 U.S.C. section 360bbb-3(b)(1), unless the authorization is terminated or revoked.  Performed at [facility], [address] [place], [place] [address]          Radiology Studies: DG Chest 2 View  Result Date: 10/06/2020 CLINICAL DATA:  Chest pain EXAM: CHEST - 2 VIEW COMPARISON:  September 26, 2020 FINDINGS: Cardiomediastinal silhouette is normal. No pneumothorax. The hila and mediastinum are normal. No focal infiltrates. No overt edema. IMPRESSION: No active cardiopulmonary disease. Electronically Signed   By: Dorise Bullion III M.D   On: 10/06/2020 17:38        Scheduled Meds: . allopurinol  300 mg Oral BID  . aspirin  81 mg Oral Daily  . atorvastatin  80 mg Oral QHS  . fluticasone furoate-vilanterol  1 puff Inhalation Daily  . insulin aspart  0-9 Units Subcutaneous TID WC  . isosorbide mononitrate  120 mg Oral Daily  . linaclotide  72 mcg Oral QAC breakfast  . lisinopril  10 mg Oral Daily  . omega-3 acid ethyl esters  4 g Oral Daily  . pantoprazole  40 mg Oral Daily  . ranolazine  1,000 mg Oral BID  . sotalol  80 mg Oral BID  . tiotropium  18 mcg Inhalation Daily  . traZODone  150 mg Oral QHS  . venlafaxine XR  75 mg Oral Q breakfast   Continuous Infusions: . heparin 900 Units/hr (10/07/20 0825)     LOS: 0 days    Time spent: 28 minutes    Sharen Hones, MD [facility]   To contact the attending provider between 7A-7P or the covering provider during after hours 7P-7A, please log into the web site [email or web address] and access using universal [place] password for that web site. If you do not have the password, please call the hospital operator.  10/07/2020, 10:01 AM

## 2020-10-08 ENCOUNTER — Other Ambulatory Visit: Payer: Self-pay | Admitting: Family Medicine

## 2020-10-08 DIAGNOSIS — I10 Essential (primary) hypertension: Secondary | ICD-10-CM | POA: Diagnosis not present

## 2020-10-08 DIAGNOSIS — I200 Unstable angina: Secondary | ICD-10-CM | POA: Diagnosis not present

## 2020-10-08 DIAGNOSIS — I480 Paroxysmal atrial fibrillation: Secondary | ICD-10-CM

## 2020-10-08 DIAGNOSIS — R079 Chest pain, unspecified: Secondary | ICD-10-CM | POA: Diagnosis not present

## 2020-10-08 DIAGNOSIS — I25119 Atherosclerotic heart disease of native coronary artery with unspecified angina pectoris: Secondary | ICD-10-CM | POA: Diagnosis not present

## 2020-10-08 LAB — CBC WITH DIFFERENTIAL/PLATELET
Abs Immature Granulocytes: 0.03 10*3/uL (ref 0.00–0.07)
Basophils Absolute: 0 10*3/uL (ref 0.0–0.1)
Basophils Relative: 1 %
Eosinophils Absolute: 0.4 10*3/uL (ref 0.0–0.5)
Eosinophils Relative: 7 %
HCT: 50 % (ref 39.0–52.0)
Hemoglobin: 16.7 g/dL (ref 13.0–17.0)
Immature Granulocytes: 1 %
Lymphocytes Relative: 33 %
Lymphs Abs: 1.9 10*3/uL (ref 0.7–4.0)
MCH: 31.5 pg (ref 26.0–34.0)
MCHC: 33.4 g/dL (ref 30.0–36.0)
MCV: 94.2 fL (ref 80.0–100.0)
Monocytes Absolute: 0.5 10*3/uL (ref 0.1–1.0)
Monocytes Relative: 8 %
Neutro Abs: 3 10*3/uL (ref 1.7–7.7)
Neutrophils Relative %: 50 %
Platelets: 145 10*3/uL — ABNORMAL LOW (ref 150–400)
RBC: 5.31 MIL/uL (ref 4.22–5.81)
RDW: 13.3 % (ref 11.5–15.5)
WBC: 5.8 10*3/uL (ref 4.0–10.5)
nRBC: 0 % (ref 0.0–0.2)

## 2020-10-08 LAB — HEPARIN LEVEL (UNFRACTIONATED)
Heparin Unfractionated: 0.47 IU/mL (ref 0.30–0.70)
Heparin Unfractionated: 0.38 IU/mL (ref 0.30–0.70)

## 2020-10-08 LAB — CBC
HCT: 52 % (ref 39.0–52.0)
Hemoglobin: 17.1 g/dL — ABNORMAL HIGH (ref 13.0–17.0)
MCH: 31.4 pg (ref 26.0–34.0)
MCHC: 32.9 g/dL (ref 30.0–36.0)
MCV: 95.4 fL (ref 80.0–100.0)
Platelets: 149 10*3/uL — ABNORMAL LOW (ref 150–400)
RBC: 5.45 MIL/uL (ref 4.22–5.81)
RDW: 13.4 % (ref 11.5–15.5)
WBC: 5.5 10*3/uL (ref 4.0–10.5)
nRBC: 0 % (ref 0.0–0.2)

## 2020-10-08 LAB — BASIC METABOLIC PANEL
Anion gap: 7 (ref 5–15)
BUN: 12 mg/dL (ref 8–23)
CO2: 29 mmol/L (ref 22–32)
Calcium: 8.9 mg/dL (ref 8.9–10.3)
Chloride: 101 mmol/L (ref 98–111)
Creatinine, Ser: 0.88 mg/dL (ref 0.61–1.24)
GFR, Estimated: >60 mL/min (ref >60)
Glucose, Bld: 180 mg/dL — ABNORMAL HIGH (ref 70–99)
Potassium: 3.9 mmol/L (ref 3.5–5.1)
Sodium: 137 mmol/L (ref 135–145)

## 2020-10-08 LAB — APTT: aPTT: 77 seconds — ABNORMAL HIGH (ref 24–36)

## 2020-10-08 LAB — GLUCOSE, CAPILLARY: Glucose-Capillary: 161 mg/dL — ABNORMAL HIGH (ref 70–99)

## 2020-10-08 LAB — MAGNESIUM: Magnesium: 2.2 mg/dL (ref 1.7–2.4)

## 2020-10-08 MED ORDER — APIXABAN 5 MG PO TABS
5.0000 mg | ORAL_TABLET | Freq: Two times a day (BID) | ORAL | Status: DC
  Administered 2020-10-08: 5 mg via ORAL
  Filled 2020-10-08: qty 1

## 2020-10-08 MED ORDER — LIDOCAINE 5 % EX PTCH: 1.0000 | MEDICATED_PATCH | Freq: Two times a day (BID) | CUTANEOUS | 0 refills | Status: DC

## 2020-10-08 NOTE — Discharge Summary (Signed)
Physician Discharge Summary  Patient ID: Lawrence [name] MRN: [id number] DOB/AGE: [birth year] 64 y.o.  Admit date: 10/06/2020 Discharge date: 10/08/2020  Admission Diagnoses:  Discharge Diagnoses:  Active Problems:   Chest pain   Discharged Condition: good  Hospital Course:  Hughey Rittenberry Woodardis a 64 y.o.malewith medical history significant ofparoxysmal A. fib, hypertension, coronary artery disease with history of CABG as well as several stents, chronic diastolic CHF, COPD / emphysema, tobacco abuse, in remission, DM 2, presents to the hospital with chief complaint of chest pain. Patient reports that he started having sudden onset substernal pressure-like chest pain this morning around 11 AM upon getting up and starting to walk. Chest pain radiates into his left shoulder and left arm. Serial troponin negative.  Patient still complaining of chest pain on 11/22.  Discussed with Dr. [name], not considering for heart cath.  Obtain CT angiogram to rule out aortic dissection as patient has persistent chest pain.  Patient had a persistent chest pain, multiple troponin negative.  Discussed with cardiology, patient condition is noncardiac related.  We will also obtain a CT chest angiogram, rule out aortic dissection.  Patient already chronically on anticoagulation, no concern for PE. At this point, chest pain most likely is due to chest wall pain.  Patient had prior bypass grafting, his sternum has some tenderness.  At this point, he will be treated with topical Lidoderm patch.  He is also advised to follow-up with cardiology and PCP in the near future.  #1.  Chest pain.  With history of coronary disease. Troponin has been negative.  However, patient chest pain seem to be persistent, not pleuritic. Patient had a recent echocardiogram on 11/13, regional abnormality, ejection fraction 45 to 50%.  No pericardial effusion.  CT angiogram ruled out aortic dissection.  #2.  COPD, tobacco abuse. No  exacerbation.  3.  Paroxysmal atrial fibrillation. Will resume Eliquis.  4.  Essential hypertension. Continue home medicines.    Consults: cardiology  Significant Diagnostic Studies: { CT ANGIOGRAPHY CHEST WITH CONTRAST  TECHNIQUE: Multidetector CT imaging of the chest was performed using the standard protocol during bolus administration of intravenous contrast. Multiplanar CT image reconstructions and MIPs were obtained to evaluate the vascular anatomy.  CONTRAST:  60m OMNIPAQUE IOHEXOL 350 MG/ML SOLN  COMPARISON:  08/11/2020  FINDINGS: Cardiovascular: No evidence of aortic aneurysm or dissection. Diffuse coronary artery and aortic atherosclerosis. Prior CABG. Heart is normal size. No large central pulmonary emboli.  Mediastinum/Nodes: No mediastinal, hilar, or axillary adenopathy. Trachea and esophagus are unremarkable. Thyroid unremarkable.  Lungs/Pleura: Lungs are clear. No focal airspace opacities or suspicious nodules. No effusions.  Upper Abdomen: Imaging into the upper abdomen demonstrates no acute findings.  Musculoskeletal: Chest wall soft tissues are unremarkable. No acute bony abnormality.  Review of the MIP images confirms the above findings.  IMPRESSION: No aneurysm or dissection.  No acute cardiopulmonary disease.  Aortic Atherosclerosis (ICD10-I70.0).   Electronically Signed   By: KRolm BaptiseM.D.   On: 10/07/2020 11:21  Treatments: Cardiac monitoring,  Discharge Exam: Blood pressure (!) 151/77, pulse (!) 48, temperature 98.5 F (36.9 C), resp. rate 17, height _0  (1.702 m), weight 99 kg, SpO2 97 %. General appearance: alert and cooperative Resp: clear to auscultation bilaterally Cardio: Irregular, no murmurs. GI: soft, non-tender; bowel sounds normal; no masses,  no organomegaly Extremities: extremities normal, atraumatic, no cyanosis or edema  Disposition: Discharge disposition: 01-Home or Self  Care       Discharge Instructions  Diet - low sodium heart healthy   Complete by: As directed    Increase activity slowly   Complete by: As directed      Allergies as of 10/08/2020      Reactions   Demerol  [meperidine Hcl]    Demerol [meperidine] Hives   Jardiance [empagliflozin] Other (See Comments)   Perineal pain   Prednisone Other (See Comments), Hypertension   Pt states that this medication puts him in A-fib    Sulfa Antibiotics Hives   Albuterol Sulfate [albuterol] Palpitations, Other (See Comments)   Pt currently uses this medication.     Morphine Sulfate Nausea And Vomiting, Rash, Other (See Comments)   Pt states that he is only allergic to the tablet form of this medication.        Medication List    TAKE these medications   albuterol 108 (90 Base) MCG/ACT inhaler Commonly known as: VENTOLIN HFA INHALE 2 PUFFS BY MOUTH EVERY 6 HOURS AS NEEDED FOR SHORTNESS OF BREATH What changed:   how much to take  how to take this  when to take this  reasons to take this  additional instructions   allopurinol 300 MG tablet Commonly known as: ZYLOPRIM TAKE 1 TABLET BY MOUTH TWICE A DAY   ALPRAZolam 1 MG tablet Commonly known as: XANAX TAKE ONE TABLET BY MOUTH THREE TIMES A DAY   amLODipine 2.5 MG tablet Commonly known as: NORVASC Take 1 tablet (2.5 mg total) by mouth daily.   aspirin 81 MG chewable tablet Chew 81 mg by mouth daily.   atorvastatin 80 MG tablet Commonly known as: LIPITOR TAKE ONE TABLET BY MOUTH AT BEDTIME   Breo Ellipta 100-25 MCG/INH Aepb Generic drug: fluticasone furoate-vilanterol INHALE 1 PUFF INTO THE LUNGS DAILY What changed: when to take this   budesonide 0.5 MG/2ML nebulizer solution Commonly known as: PULMICORT Take 0.5 mg by nebulization 2 (two) times daily as needed (shortness of breath or wheezing).   cetirizine 10 MG tablet Commonly known as: ZYRTEC Take 1 tablet (10 mg total) by mouth at bedtime.   docusate  sodium 100 MG capsule Commonly known as: COLACE Take 100 mg by mouth daily.   Eliquis 5 MG Tabs tablet Generic drug: apixaban TAKE 1 TABLET BY MOUTH TWICE A DAY   Farxiga 10 MG Tabs tablet Generic drug: dapagliflozin propanediol TAKE 1 TABLET BY MOUTH DAILY What changed: how much to take   isosorbide mononitrate 120 MG 24 hr tablet Commonly known as: IMDUR TAKE 1 TABLET BY MOUTH DAILY   lidocaine 5 % Commonly known as: Lidoderm Place 1 patch onto the skin every 12 (twelve) hours. Remove & Discard patch within 12 hours or as directed by MD   Linzess 72 MCG capsule Generic drug: linaclotide Take 72 mcg by mouth daily before breakfast.   lisinopril 10 MG tablet Commonly known as: ZESTRIL TAKE 1 TABLET BY MOUTH DAILY   magnesium oxide 400 (241.3 Mg) MG tablet Commonly known as: MAG-OX Take 1 tablet (400 mg total) by mouth every morning.   metaxalone 800 MG tablet Commonly known as: SKELAXIN Take 1 tablet (800 mg total) by mouth 3 (three) times daily as needed (pain).   metFORMIN 500 MG 24 hr tablet Commonly known as: GLUCOPHAGE-XR Take 1 tablet (500 mg total) by mouth daily with breakfast.   naloxone 4 MG/0.1ML Liqd nasal spray kit Commonly known as: NARCAN 1 spray into nostril x1 and may repeat every 2-3 minutes until patient is responsive or EMS arrives  nitroGLYCERIN 0.4 MG SL tablet Commonly known as: NITROSTAT PLACE ONE TABLET UNDER THE TONGUE EVERY 5 MINUTES FOR CHEST PAIN. IF NO RELIEF PAST 3RD TAB GO TO EMERGENCY ROOM What changed: See the new instructions.   omega-3 acid ethyl esters 1 g capsule Commonly known as: LOVAZA TAKE 4 CAPSULES BY MOUTH DAILY   omeprazole 40 MG capsule Commonly known as: PRILOSEC Take 40 mg by mouth daily.   oxyCODONE-acetaminophen 10-325 MG tablet Commonly known as: PERCOCET TAKE 1 TABLET BY MOUTH EVERY 4 HOURS AS NEEDED FOR PAIN What changed:   reasons to take this  additional instructions   ranolazine 1000 MG SR  tablet Commonly known as: RANEXA TAKE 1 TABLET BY MOUTH TWICE A DAY   sotalol 80 MG tablet Commonly known as: BETAPACE Take 80 mg by mouth 2 (two) times daily.   tiotropium 18 MCG inhalation capsule Commonly known as: SPIRIVA Place 18 mcg into inhaler and inhale daily.   torsemide 100 MG tablet Commonly known as: DEMADEX Take 0.5 tablets (50 mg total) by mouth daily.   traZODone 150 MG tablet Commonly known as: DESYREL TAKE ONE TABLET BY MOUTH AT BEDTIME   venlafaxine XR 75 MG 24 hr capsule Commonly known as: EFFEXOR-XR TAKE 1 CAPSULE BY MOUTH DAILY WITH BREAKFAST What changed: See the new instructions.   Vitamin D3 125 MCG (5000 UT) Caps Take 5,000 Units by mouth daily.       Follow-up Information    Birdie Sons, MD Follow up in 1 week(s).   Specialty: Family Medicine Contact information: [address] [place] [address] [phone]        Yolonda Kida, MD Follow up in 2 week(s).   Specialties: Cardiology, Internal Medicine Contact information: [place] [address] [phone]               Signed: Sharen Hones 10/08/2020, 10:00 AM

## 2020-10-08 NOTE — Progress Notes (Signed)
Pt needed oxygen to get home. Pt stated that is oxygen is at home. Pt called his son to bring the oxygen, pt son stated he does not have a house key. Pt then stated that he lives only 5-10 minutes away from here and can go without the oxygen to get home. Explained to the patient how important it is for him to have his oxygen. Pt then stated again that he will be okay.

## 2020-10-08 NOTE — Consult Note (Signed)
ANTICOAGULATION CONSULT NOTE  Pharmacy Consult for Heparin Infusion Indication: chest pain/ACS  Patient Measurements: Heparin Dosing Weight: 87.4 kg   Labs: Recent Labs    10/06/20 1704 10/06/20 1953 10/06/20 2256 10/06/20 2256 10/07/20 0253 10/07/20 0253 10/07/20 0459 10/07/20 0656 10/07/20 1227 10/07/20 1808 10/08/20 0226  HGB 18.9*   < >  --   --  16.2  --   --   --   --   --  16.7  HCT 55.8*  --   --   --  49.3  --   --   --   --   --  50.0  PLT 173  --   --   --  153  --   --   --   --   --  145*  APTT  --   --  >160*   < > >160*   < >  --   --  68* 55* 77*  LABPROT 12.9  --   --   --   --   --   --   --   --   --   --   INR 1.0  --   --   --   --   --   --   --   --   --   --   HEPARINUNFRC  --   --  2.16*   < > 1.48*  --   --   --  0.76*  --  0.47  CREATININE 0.83  --   --   --  0.87  --   --   --   --   --  0.88  TROPONINIHS 13   < > 13  --   --   --  10 11  --   --   --    < > = values in this interval not displayed.    Estimated Creatinine Clearance: 92.7 mL/min (by C-G formula based on SCr of 0.88 mg/dL).   Medical History: Past Medical History:  Diagnosis Date  . A-fib ([place])   . Anemia   . Anginal pain ([place])   . Anxiety   . Arthritis   . Asthma   . CAD (coronary artery disease)    a. 2002 CABGx2 (LIMA->LAD, VG->VG->OM1);  b. 09/2012 DES->OM;  c. 03/2015 PTCA of LAD [facility]) in setting of atretic LIMA; d. 05/2015 Cath [facility]): nonobs dzs; e. 06/2015 Cath ([facility]): LM nl, LAD 45p/d ISR, 50d, D1/2 small, LCX 50p/d ISR, OM1 70ost, 30 ISR, VG->OM1 50ost, 17m LIMA->LAD 99p/d - atretic, RCA dom, nl; f.cath 10/16: 40-50%(FFR 0.90) pLAD, 75% (FFR 0.77) mLAD s/p PCI/DES, oRCA 40% (FFR0.95)  . Cancer ([place]    SKIN CANCER ON BACK  . Celiac disease   . Chronic diastolic CHF (congestive heart failure) ([place]    a. 06/2009 Echo: EF 60-65%, Gr 1 DD, triv AI, mildly dil LA, nl RV.  .Marland KitchenCOPD (chronic obstructive pulmonary disease) ([place]    a. Chronic bronchitis and emphysema.  .  DDD (degenerative disc disease), lumbar   . Diverticulosis   . Dysrhythmia   . Essential hypertension   . GERD (gastroesophageal reflux disease)   . History of hiatal hernia   . History of kidney stones    H/O  . History of tobacco abuse    a. Quit 2014.  .Marland KitchenMyocardial infarction ([place] 2002   4 STENTS  . Pancreatitis   . PSVT (paroxysmal supraventricular tachycardia) ([facility]  a. 10/2012 Noted on Zio Patch.  . Sleep apnea    LOST CORD TO CPAP -ONLY 02 @ BEDTIME  . Tubular adenoma of colon   . Type II diabetes mellitus (HCC)     Medications:  Apixaban 5 mg BID prior to admission (patient reported last dose was 11/21 at 0900)  Assessment: Patient is a 64 y/o M with medical history as above who presented to the ED 11/21 with chest pain. Troponin flat around 12. Pharmacy has been consulted to initiate heparin infusion for suspected ACS.  Baseline INR 1, baseline aPTT > 160 (drawn after initiation of infusion), baseline heparin level 2.16 (drawn after initiation of infusion). Baseline CBC notable for hemoglobin of 18.9.   11/22 0253 aPTT >160 sec, held x 1hr, decreased to 900 units/hr 11/22 1227 aPTT 68 sec, therapeutic x 1 11/22 1808 aPTT 55 sec, subtherapeutic  11/23 0226 aPTT 77 sec, therapeutic x 1 (HL = 0.47, correlates)  Goal of Therapy:  aPTT 66 - 102 seconds Monitor platelets by anticoagulation protocol: Yes   Plan:  --11/23 at 0226 aPTT and HL therapeutic. Will continue heparin infusion at 1050 units/hr.  Will use HL to monitor since now correlates w/ aPTT --Re-check HL in 6 hours to confirm.  --Daily CBC per protocol while on heparin drip   Hart Robinsons A 10/08/2020 3:04 AM

## 2020-10-08 NOTE — Telephone Encounter (Signed)
° °  Last refill:  01/16/2020  Future visit scheduled:yes   Notes to clinic:  medication was filled by a historical provider  Review for refill   Requested Prescriptions  Pending Prescriptions Disp Refills   [place] Walk 18 MCG inhalation capsule [Pharmacy Med Name: SPIRIVA HANDIHALER 18 MCG [place] CAP] 30 capsule     Sig: PLACE 1 CAPSULE INTO INHALER AND INHALE ONCE A DAY      Pulmonology:  Anticholinergic Agents Passed - 10/08/2020  3:26 PM      Passed - Valid encounter within last 12 months    Recent Outpatient Visits           2 weeks ago Type 2 diabetes mellitus with diabetic nephropathy, without long-term current use of insulin ([place])   [facility] Birdie Sons, MD   4 months ago Type 2 diabetes mellitus with diabetic nephropathy, without long-term current use of insulin ([place])   [facility] Birdie Sons, MD   6 months ago Insomnia, unspecified type   [facility] Birdie Sons, MD   9 months ago Type 2 diabetes mellitus with diabetic nephropathy, without long-term current use of insulin ([place])   [facility] Birdie Sons, MD   1 year ago Type 2 diabetes mellitus with diabetic nephropathy, without long-term current use of insulin [facility])   [facility] Birdie Sons, MD       Future Appointments             In 1 week [name], [name], MD [facility], [place]   In 2 weeks [name], [name], MD [facility], [place]

## 2020-10-08 NOTE — Consult Note (Signed)
ANTICOAGULATION CONSULT NOTE  Pharmacy Consult for Heparin Infusion Indication: chest pain/ACS and atrial fibrillation  Patient Measurements: Heparin Dosing Weight: 87.4 kg   Labs: Recent Labs    10/06/20 1704 10/06/20 1704 10/06/20 1953 10/06/20 2256 10/07/20 0253 10/07/20 0253 10/07/20 0459 10/07/20 0656 10/07/20 1227 10/07/20 1808 10/08/20 0226 10/08/20 0845  HGB 18.9*   < >  --   --  16.2   < >  --   --   --   --  16.7 17.1*  HCT 55.8*   < >  --   --  49.3  --   --   --   --   --  50.0 52.0  PLT 173   < >  --   --  153  --   --   --   --   --  145* 149*  APTT  --    < >  --  >160* >160*   < >  --   --  68* 55* 77*  --   LABPROT 12.9  --   --   --   --   --   --   --   --   --   --   --   INR 1.0  --   --   --   --   --   --   --   --   --   --   --   HEPARINUNFRC  --    < >  --  2.16* 1.48*   < >  --   --  0.76*  --  0.47 0.38  CREATININE 0.83  --   --   --  0.87  --   --   --   --   --  0.88  --   TROPONINIHS 13  --    < > 13  --   --  10 11  --   --   --   --    < > = values in this interval not displayed.    Estimated Creatinine Clearance: 92.6 mL/min (by C-G formula based on SCr of 0.88 mg/dL).   Medical History: Past Medical History:  Diagnosis Date  . A-fib ([place])   . Anemia   . Anginal pain ([place])   . Anxiety   . Arthritis   . Asthma   . CAD (coronary artery disease)    a. 2002 CABGx2 (LIMA->LAD, VG->VG->OM1);  b. 09/2012 DES->OM;  c. 03/2015 PTCA of LAD [facility]) in setting of atretic LIMA; d. 05/2015 Cath [facility]): nonobs dzs; e. 06/2015 Cath ([facility]): LM nl, LAD 45p/d ISR, 50d, D1/2 small, LCX 50p/d ISR, OM1 70ost, 30 ISR, VG->OM1 50ost, 69m LIMA->LAD 99p/d - atretic, RCA dom, nl; f.cath 10/16: 40-50%(FFR 0.90) pLAD, 75% (FFR 0.77) mLAD s/p PCI/DES, oRCA 40% (FFR0.95)  . Cancer ([place]    SKIN CANCER ON BACK  . Celiac disease   . Chronic diastolic CHF (congestive heart failure) ([place]    a. 06/2009 Echo: EF 60-65%, Gr 1 DD, triv AI, mildly dil LA, nl RV.  .Marland KitchenCOPD  (chronic obstructive pulmonary disease) ([place]    a. Chronic bronchitis and emphysema.  . DDD (degenerative disc disease), lumbar   . Diverticulosis   . Dysrhythmia   . Essential hypertension   . GERD (gastroesophageal reflux disease)   . History of hiatal hernia   . History of kidney stones    H/O  . History of tobacco abuse  a. Quit 2014.  Marland Kitchen Myocardial infarction ([place]) 2002   4 STENTS  . Pancreatitis   . PSVT (paroxysmal supraventricular tachycardia) ([place])    a. 10/2012 Noted on Zio Patch.  . Sleep apnea    LOST CORD TO CPAP -ONLY 02 @ BEDTIME  . Tubular adenoma of colon   . Type II diabetes mellitus (HCC)     Medications:  Apixaban 5 mg BID prior to admission (patient reported last dose was 11/21 at 0900)  Assessment: Patient is a 64 y/o M with medical history as above who presented to the ED 11/21 with chest pain. Troponin flat around 12. Pharmacy has been consulted to initiate heparin infusion for suspected ACS.  11/22 0253 aPTT >160 sec, held x 1hr, decreased to 900 units/hr 11/22 1227 aPTT 68 sec, therapeutic x 1 11/22 1808 aPTT 55 sec, subtherapeutic  11/23 0226 aPTT 77 sec, therapeutic x 1 (HL = 0.47, correlates) 11/23 0845 HL 0.38   Goal of Therapy:  aPTT 66 - 102 seconds Monitor platelets by anticoagulation protocol: Yes   Plan:  Heparin level is therapeutic. Will continue heparin infusion at 1050 units/hr. Recheck heparin level and CBC with AM labs. F/u plan to restart apixaban. Deferred cath per cards.     Oswald Hillock, PharmD, BCPS 10/08/2020 9:46 AM

## 2020-10-08 NOTE — Progress Notes (Signed)
[facility] Cardiology    SUBJECTIVE: Patient currently states he is pain-free there is no objective evidence of significant ischemia patient has chronic significant pain would recommend further evaluation in the pain clinic patient should be safe for discharge today continue conservative care of the patient follow-up with primary physician   Vitals:   10/07/20 1945 10/08/20 0346 10/08/20 0526 10/08/20 0735  BP: 112/64 114/87  (!) 151/77  Pulse: (!) 52 (!) 45  (!) 48  Resp: 20 18  17   Temp: 97.9 F (36.6 C) (!) 97.4 F (36.3 C)  98.5 F (36.9 C)  TempSrc: Oral Oral  Oral  SpO2: 96% 95%  97%  Weight:   99 kg   Height:         Intake/Output Summary (Last 24 hours) at 10/08/2020 1059 Last data filed at 10/08/2020 1024 Gross per 24 hour  Intake 220.14 ml  Output 1425 ml  Net -1204.86 ml      PHYSICAL EXAM  General: Well developed, well nourished, in no acute distress HEENT:  Normocephalic and atramatic Neck:  No JVD.  Lungs: Clear bilaterally to auscultation and percussion. Heart: HRRR . Normal S1 and S2 without gallops or murmurs.  Abdomen: Bowel sounds are positive, abdomen soft and non-tender  Msk:  Back normal, normal gait. Normal strength and tone for age. Extremities: No clubbing, cyanosis or edema.   Neuro: Alert and oriented X 3. Psych:  Good affect, responds appropriately   LABS: Basic Metabolic Panel: Recent Labs    10/07/20 0253 10/08/20 0226  NA 141 137  K 4.5 3.9  CL 101 101  CO2 33* 29  GLUCOSE 173* 180*  BUN 11 12  CREATININE 0.87 0.88  CALCIUM 8.9 8.9  MG  --  2.2   Liver Function Tests: Recent Labs    10/07/20 0253  AST 15  ALT 15  ALKPHOS 53  BILITOT 0.6  PROT 5.8*  ALBUMIN 3.3*   No results for input(s): LIPASE, AMYLASE in the last 72 hours. CBC: Recent Labs    10/08/20 0226 10/08/20 0845  WBC 5.8 5.5  NEUTROABS 3.0  --   HGB 16.7 17.1*  HCT 50.0 52.0  MCV 94.2 95.4  PLT 145* 149*   Cardiac Enzymes: No results for input(s):  CKTOTAL, CKMB, CKMBINDEX, TROPONINI in the last 72 hours. BNP: Invalid input(s): POCBNP D-Dimer: No results for input(s): DDIMER in the last 72 hours. Hemoglobin A1C: No results for input(s): HGBA1C in the last 72 hours. Fasting Lipid Panel: No results for input(s): CHOL, HDL, LDLCALC, TRIG, CHOLHDL, LDLDIRECT in the last 72 hours. Thyroid Function Tests: No results for input(s): TSH, T4TOTAL, T3FREE, THYROIDAB in the last 72 hours.  Invalid input(s): FREET3 Anemia Panel: No results for input(s): VITAMINB12, FOLATE, FERRITIN, TIBC, IRON, RETICCTPCT in the last 72 hours.  DG Chest 2 View  Result Date: 10/06/2020 CLINICAL DATA:  Chest pain EXAM: CHEST - 2 VIEW COMPARISON:  September 26, 2020 FINDINGS: Cardiomediastinal silhouette is normal. No pneumothorax. The hila and mediastinum are normal. No focal infiltrates. No overt edema. IMPRESSION: No active cardiopulmonary disease. Electronically Signed   By: Dorise Bullion III M.D   On: 10/06/2020 17:38   CT ANGIO CHEST AORTA W/CM &/OR WO/CM  Result Date: 10/07/2020 CLINICAL DATA:  Chest pain, back pain.  Evaluate for dissection. EXAM: CT ANGIOGRAPHY CHEST WITH CONTRAST TECHNIQUE: Multidetector CT imaging of the chest was performed using the standard protocol during bolus administration of intravenous contrast. Multiplanar CT image reconstructions and MIPs were obtained  to evaluate the vascular anatomy. CONTRAST:  9m OMNIPAQUE IOHEXOL 350 MG/ML SOLN COMPARISON:  08/11/2020 FINDINGS: Cardiovascular: No evidence of aortic aneurysm or dissection. Diffuse coronary artery and aortic atherosclerosis. Prior CABG. Heart is normal size. No large central pulmonary emboli. Mediastinum/Nodes: No mediastinal, hilar, or axillary adenopathy. Trachea and esophagus are unremarkable. Thyroid unremarkable. Lungs/Pleura: Lungs are clear. No focal airspace opacities or suspicious nodules. No effusions. Upper Abdomen: Imaging into the upper abdomen demonstrates no  acute findings. Musculoskeletal: Chest wall soft tissues are unremarkable. No acute bony abnormality. Review of the MIP images confirms the above findings. IMPRESSION: No aneurysm or dissection. No acute cardiopulmonary disease. Aortic Atherosclerosis (ICD10-I70.0). Electronically Signed   By: KRolm BaptiseM.D.   On: 10/07/2020 11:21     Echo previous echo November 13 ejection fraction of 45 to 50%  TELEMETRY: Sinus rhythm controlled rate nonspecific findings  ASSESSMENT AND PLAN:  Active Problems:   Chest pain Chronic chest pain Multivessel coronary disease COPD Previous smoker Diabetes Hyperlipidemia GERD History of coronary bypass surgery Atrial fibrillation paroxysmal Possible narcotic dependence . Plan Recommend conservative medical therapy Continue current medications for A. fib coronary disease and chest pain Do not recommend invasive strategy at this stage I do not recommend prescribing narcotics Recommend the patient follow-up with the pain clinic We will refer the patient back to her primary cardiologist for outpatient evaluation     DYolonda Kida MD, 10/08/2020 10:59 AM

## 2020-10-14 ENCOUNTER — Other Ambulatory Visit: Payer: Self-pay | Admitting: Family Medicine

## 2020-10-16 ENCOUNTER — Ambulatory Visit (INDEPENDENT_AMBULATORY_CARE_PROVIDER_SITE_OTHER): Payer: Medicare HMO | Admitting: Family Medicine

## 2020-10-16 ENCOUNTER — Encounter: Payer: Self-pay | Admitting: Family Medicine

## 2020-10-16 ENCOUNTER — Other Ambulatory Visit: Payer: Self-pay | Admitting: Family Medicine

## 2020-10-16 ENCOUNTER — Other Ambulatory Visit: Payer: Self-pay

## 2020-10-16 VITALS — BP 136/82 | HR 62 | Temp 98.6°F | Resp 16 | Wt 225.0 lb

## 2020-10-16 DIAGNOSIS — E1121 Type 2 diabetes mellitus with diabetic nephropathy: Secondary | ICD-10-CM

## 2020-10-16 DIAGNOSIS — R0789 Other chest pain: Secondary | ICD-10-CM | POA: Diagnosis not present

## 2020-10-16 DIAGNOSIS — F419 Anxiety disorder, unspecified: Secondary | ICD-10-CM | POA: Diagnosis not present

## 2020-10-16 MED ORDER — METFORMIN HCL ER 500 MG PO TB24: 500.0000 mg | ORAL_TABLET | Freq: Two times a day (BID) | ORAL | 5 refills | Status: DC

## 2020-10-16 MED ORDER — LIDOCAINE HCL 4 % EX CREA: TOPICAL_CREAM | CUTANEOUS | 2 refills | Status: DC

## 2020-10-16 NOTE — Telephone Encounter (Signed)
Patient called stating that Dr. [name] was supposed to send this medication in today at his office visit but did not.  He states that he is out of this medication.  Please advise

## 2020-10-16 NOTE — Progress Notes (Signed)
Established patient visit   Patient: Lawrence [name]   DOB: 1955/12/29   64 y.o. Male  MRN: [id number] Visit Date: 10/16/2020  Today's healthcare provider: [name], MD   Chief Complaint  Patient presents with  . Hospitalization Follow-up   Subjective    HPI  Follow up Hospitalization  Patient was admitted to [facility] on 10/06/2020 and discharged on 10/08/2020. He was treated for chest pain. He ruled out for MI and had negative CTA of chest. 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  Treatment for this included resuming Eliquis and continuing home medictaions. Patient is to follow up with Cardiology on 10/21/2020. Telephone follow up was not done.  He reports good compliance with treatment. He reports this condition is stayed the same. He has not been able to use the lidoderm patches since they will not stick to his chest.   ----------------------------------------------------------------------------------------- - He is also due for diabetes follow up with last a1c of 9.6 on 09/24/2020. Was started on metformin. Sugars remained elevated in recent hospitalization, but is tolerating metformin with no adverse effects.   He also states he has had a lot of anxiety and is in process of getting puppy for emotional support.     Medications: Outpatient Medications Prior to Visit  Medication Sig  . albuterol (VENTOLIN HFA) 108 (90 Base) MCG/ACT inhaler INHALE 2 PUFFS BY MOUTH EVERY 6 HOURS AS NEEDED FOR SHORTNESS OF BREATH (Patient taking differently: Inhale 2 puffs into the lungs every 6 (six) hours as needed for wheezing or shortness of breath. )  . allopurinol (ZYLOPRIM) 300 MG tablet TAKE 1 TABLET BY MOUTH TWICE A DAY (Patient taking differently: Take 300 mg by mouth 2 (two) times daily. )  . ALPRAZolam (XANAX) 1 MG tablet TAKE ONE TABLET BY MOUTH THREE TIMES A DAY (Patient taking differently: Take 1 mg  by mouth 3 (three) times daily. )  . amLODipine (NORVASC) 2.5 MG tablet Take 1 tablet (2.5 mg total) by mouth daily.  Marland Kitchen aspirin 81 MG chewable tablet Chew 81 mg by mouth daily.  Marland Kitchen atorvastatin (LIPITOR) 80 MG tablet TAKE ONE TABLET BY MOUTH AT BEDTIME (Patient taking differently: Take 80 mg by mouth at bedtime. )  . BREO ELLIPTA 100-25 MCG/INH AEPB INHALE 1 PUFF INTO THE LUNGS DAILY (Patient taking differently: Inhale 1 puff into the lungs daily. )  . budesonide (PULMICORT) 0.5 MG/2ML nebulizer solution Take 0.5 mg by nebulization 2 (two) times daily as needed (shortness of breath or wheezing).   . cetirizine (ZYRTEC) 10 MG tablet Take 1 tablet (10 mg total) by mouth at bedtime.  . Cholecalciferol (VITAMIN D3) 125 MCG (5000 UT) CAPS Take 5,000 Units by mouth daily.   Marland Kitchen docusate sodium (COLACE) 100 MG capsule Take 100 mg by mouth daily.   Marland Kitchen ELIQUIS 5 MG TABS tablet TAKE 1 TABLET BY MOUTH TWICE A DAY  . FARXIGA 10 MG TABS tablet TAKE 1 TABLET BY MOUTH DAILY (Patient taking differently: Take 10 mg by mouth daily. )  . isosorbide mononitrate (IMDUR) 120 MG 24 hr tablet TAKE 1 TABLET BY MOUTH DAILY  . lidocaine (LIDODERM) 5 % Place 1 patch onto the skin every 12 (twelve) hours. Remove & Discard patch within 12 hours or as directed by MD  . Rolan Lipa 72 MCG capsule Take 72 mcg by mouth daily before breakfast.   . lisinopril (ZESTRIL) 10 MG tablet TAKE 1 TABLET BY  MOUTH DAILY (Patient taking differently: Take 10 mg by mouth daily. )  . magnesium oxide (MAG-OX) 400 (241.3 Mg) MG tablet Take 1 tablet (400 mg total) by mouth every morning.  . metaxalone (SKELAXIN) 800 MG tablet Take 1 tablet (800 mg total) by mouth 3 (three) times daily as needed (pain).  . metFORMIN (GLUCOPHAGE-XR) 500 MG 24 hr tablet Take 1 tablet (500 mg total) by mouth daily with breakfast.  . naloxone (NARCAN) nasal spray 4 mg/0.1 mL 1 spray into nostril x1 and may repeat every 2-3 minutes until patient is responsive or EMS arrives  .  nitroGLYCERIN (NITROSTAT) 0.4 MG SL tablet PLACE ONE TABLET UNDER THE TONGUE EVERY 5 MINUTES FOR CHEST PAIN. IF NO RELIEF PAST 3RD TAB GO TO EMERGENCY ROOM (Patient taking differently: Place 0.4 mg under the tongue every 5 (five) minutes as needed for chest pain. )  . omega-3 acid ethyl esters (LOVAZA) 1 g capsule TAKE 4 CAPSULES BY MOUTH DAILY (Patient taking differently: Take 4 g by mouth daily. )  . omeprazole (PRILOSEC) 40 MG capsule Take 40 mg by mouth daily.   Marland Kitchen oxyCODONE-acetaminophen (PERCOCET) 10-325 MG tablet TAKE 1 TABLET BY MOUTH EVERY 4 HOURS AS NEEDED FOR PAIN (Patient taking differently: Take 1 tablet by mouth every 4 (four) hours as needed for pain. )  . ranolazine (RANEXA) 1000 MG SR tablet TAKE 1 TABLET BY MOUTH TWICE A DAY (Patient taking differently: Take 1,000 mg by mouth 2 (two) times daily. )  . sotalol (BETAPACE) 80 MG tablet Take 80 mg by mouth 2 (two) times daily.  Marland Kitchen SPIRIVA HANDIHALER 18 MCG inhalation capsule PLACE 1 CAPSULE INTO INHALER AND INHALE ONCE A DAY  . torsemide (DEMADEX) 100 MG tablet Take 0.5 tablets (50 mg total) by mouth daily.  . traZODone (DESYREL) 150 MG tablet TAKE ONE TABLET BY MOUTH AT BEDTIME (Patient taking differently: Take 150 mg by mouth at bedtime. )  . venlafaxine XR (EFFEXOR-XR) 75 MG 24 hr capsule TAKE 1 CAPSULE BY MOUTH DAILY WITH BREAKFAST (Patient taking differently: Take 75 mg by mouth daily with breakfast. )   No facility-administered medications prior to visit.    Review of Systems  Constitutional: Negative for appetite change, chills, fatigue and fever.  HENT: Negative for congestion, ear pain, hearing loss, nosebleeds and trouble swallowing.   Eyes: Negative for pain and visual disturbance.  Respiratory: Negative for cough, chest tightness and shortness of breath.   Cardiovascular: Positive for chest pain. Negative for palpitations and leg swelling.  Gastrointestinal: Negative for abdominal pain, blood in stool, constipation,  diarrhea, nausea and vomiting.  Endocrine: Negative for polydipsia, polyphagia and polyuria.  Genitourinary: Negative for dysuria and flank pain.  Musculoskeletal: Negative for arthralgias, back pain, joint swelling, myalgias and neck stiffness.  Skin: Negative for color change, rash and wound.  Neurological: Negative for dizziness, tremors, seizures, speech difficulty, weakness, light-headedness and headaches.  Psychiatric/Behavioral: Negative for behavioral problems, confusion, decreased concentration, dysphoric mood and sleep disturbance. The patient is not nervous/anxious.   All other systems reviewed and are negative.     Objective    BP 136/82 (BP Location: Left Arm, Patient Position: Sitting, Cuff Size: Large)   Pulse 62   Temp 98.6 F (37 C) (Oral)   Resp 16   Wt 225 lb (102.1 kg)   BMI 35.24 kg/m    Physical Exam   General: Appearance:    Obese male in no acute distress  Eyes:    PERRL, conjunctiva/corneas clear, EOM's  intact       Lungs:     Clear to auscultation bilaterally, respirations unlabored  Heart:    Normal heart rate. Regular rhythm. No murmurs, rubs, or gallops.   MS:   All extremities are intact.   Neurologic:   Awake, alert, oriented x 3. No apparent focal neurological           defect.         Assessment & Plan     1. Chest wall pain Unable to use lidocaine patch since it will not adhere to his chest. Will try- Lidocaine HCl 4 % CREA; Apply to affected area two to three times evert day  Dispense: 80 mL; Refill: 2  2. Type 2 diabetes mellitus with diabetic nephropathy, without long-term current use of insulin ([place]) Tolerating initiation of metformin last month. Will double dose to - metFORMIN (GLUCOPHAGE-XR) 500 MG 24 hr tablet; Take 1 tablet (500 mg total) by mouth in the morning and at bedtime.  Dispense: 60 tablet; Refill: 5  Return in January to check A1c.   3. Anxiety Worse. He is going to get a puppy for emotional support and needs letter for  his landlord of medical need for emotional support animal.    No follow-ups on file.         [name], MD  [facility] [phone] (phone) [phone] (fax)  [place]

## 2020-10-18 ENCOUNTER — Telehealth: Payer: Self-pay

## 2020-10-18 NOTE — Telephone Encounter (Signed)
Copied from [place] [phone]. Topic: General - Other >> Oct 18, 2020 12:16 PM Keene Breath wrote: Reason for CRM: Patient called to check the status of a letter that he said the doctor was suppose to send him for his pet that he needs for emotional support.  Please advise and call patient to discuss and get an update.  CB# [phone] >> Oct 18, 2020  2:48 PM Keene Breath wrote: Patient called again to get an update on the letter he is requesting.  Patient would like the letter faxed to [phone]

## 2020-10-18 NOTE — Telephone Encounter (Signed)
Copied from [place] [phone]. Topic: General - Other >> Oct 18, 2020 12:16 PM Keene Breath wrote: Reason for CRM: Patient called to check the status of a letter that he said the doctor was suppose to send him for his pet that he needs for emotional support.  Please advise and call patient to discuss and get an update.  CB# [phone]

## 2020-10-19 NOTE — Progress Notes (Signed)
[place]  [phone] [phone]  ID: [name] OB: 09-Sep-1956  MR#: [id number]  FHL#:[id number]  Patient Care Team: Birdie Sons, MD as PCP - General (Family Medicine) Allyne Gee, MD as Consulting Physician (Pulmonary Disease) Marisa Hua, PA-C as Physician Assistant Anell Barr, OD as Consulting Physician (Optometry) Ubaldo Glassing Javier Docker, MD as Consulting Physician (Cardiology)  CHIEF COMPLAINT: Polycythemia.  INTERVAL HISTORY: Patient is a 64 year old male who was noted to have an increased hemoglobin on routine blood work.  He currently feels well and is asymptomatic.  He has no neurologic events.  He denies any recent fevers or illnesses.  He has a good appetite and denies weight loss.  He has no chest pain, shortness of breath, cough, or hemoptysis.  He denies any nausea, vomiting, constipation, or diarrhea.  He has no urinary complaints.  Patient feels at his baseline offers no specific complaints today.  REVIEW OF SYSTEMS:   Review of Systems  Constitutional: Negative.  Negative for fever, malaise/fatigue and weight loss.  Respiratory: Negative.  Negative for cough, hemoptysis and shortness of breath.   Cardiovascular: Negative.  Negative for chest pain and leg swelling.  Gastrointestinal: Negative.  Negative for abdominal pain.  Genitourinary: Negative.  Negative for dysuria.  Musculoskeletal: Negative.  Negative for back pain.  Skin: Negative.  Negative for rash.  Neurological: Negative.  Negative for dizziness, focal weakness, weakness and headaches.  Psychiatric/Behavioral: Negative.  The patient is not nervous/anxious.     As per HPI. Otherwise, a complete review of systems is negative.  PAST MEDICAL HISTORY: Past Medical History:  Diagnosis Date  . A-fib ([place])   . Anemia   . Anginal pain ([place])   . Anxiety   . Arthritis   . Asthma   . CAD (coronary artery disease)    a. 2002 CABGx2 (LIMA->LAD,  VG->VG->OM1);  b. 09/2012 DES->OM;  c. 03/2015 PTCA of LAD [facility]) in setting of atretic LIMA; d. 05/2015 Cath [facility]): nonobs dzs; e. 06/2015 Cath ([facility]): LM nl, LAD 45p/d ISR, 50d, D1/2 small, LCX 50p/d ISR, OM1 70ost, 30 ISR, VG->OM1 50ost, 65m LIMA->LAD 99p/d - atretic, RCA dom, nl; f.cath 10/16: 40-50%(FFR 0.90) pLAD, 75% (FFR 0.77) mLAD s/p PCI/DES, oRCA 40% (FFR0.95)  . Cancer ([place]    SKIN CANCER ON BACK  . Celiac disease   . Chronic diastolic CHF (congestive heart failure) ([place]    a. 06/2009 Echo: EF 60-65%, Gr 1 DD, triv AI, mildly dil LA, nl RV.  .Marland KitchenCOPD (chronic obstructive pulmonary disease) ([place]    a. Chronic bronchitis and emphysema.  . DDD (degenerative disc disease), lumbar   . Diverticulosis   . Dysrhythmia   . Essential hypertension   . GERD (gastroesophageal reflux disease)   . History of hiatal hernia   . History of kidney stones    H/O  . History of tobacco abuse    a. Quit 2014.  .Marland KitchenMyocardial infarction ([place] 2002   4 STENTS  . Pancreatitis   . PSVT (paroxysmal supraventricular tachycardia) ([place]    a. 10/2012 Noted on Zio Patch.  . Sleep apnea    LOST CORD TO CPAP -ONLY 02 @ BEDTIME  . Tubular adenoma of colon   . Type II diabetes mellitus ([place]     PAST SURGICAL HISTORY: Past Surgical History:  Procedure Laterality Date  . BYPASS GRAFT    . CARDIAC CATHETERIZATION N/A 07/12/2015   rocedure: Left Heart Cath and Cors/Grafts Angiography;  Surgeon:  Belva Crome, MD;  Location: [place] [facility] LAB;  Service: Cardiovascular;  Laterality: N/A;  . CARDIAC CATHETERIZATION Right 10/07/2015   Procedure: Left Heart Cath and Cors/Grafts Angiography;  Surgeon: Dionisio David, MD;  Location: [place] [facility] LAB;  Service: Cardiovascular;  Laterality: Right;  . CARDIAC CATHETERIZATION N/A 04/06/2016   Procedure: Left Heart Cath and Coronary Angiography;  Surgeon: Yolonda Kida, MD;  Location: [place] CV LAB;  Service: Cardiovascular;  Laterality: N/A;  . CARDIAC  CATHETERIZATION  04/06/2016   Procedure: Bypass Graft Angiography;  Surgeon: Yolonda Kida, MD;  Location: [place] [facility] LAB;  Service: Cardiovascular;;  . CARDIAC CATHETERIZATION N/A 11/02/2016   Procedure: Left Heart Cath and Cors/Grafts Angiography and possible PCI;  Surgeon: Yolonda Kida, MD;  Location: [place] [facility] LAB;  Service: Cardiovascular;  Laterality: N/A;  . CARDIAC CATHETERIZATION N/A 11/02/2016   Procedure: Coronary Stent Intervention;  Surgeon: Yolonda Kida, MD;  Location: [place] [facility] LAB;  Service: Cardiovascular;  Laterality: N/A;  . CHOLECYSTECTOMY    . CIRCUMCISION N/A 06/09/2019   Procedure: CIRCUMCISION ADULT;  Surgeon: Billey Co, MD;  Location: [facility] ORS;  Service: Urology;  Laterality: N/A;  . COLONOSCOPY WITH PROPOFOL N/A 04/01/2018   Procedure: COLONOSCOPY WITH PROPOFOL;  Surgeon: Manya Silvas, MD;  Location: [facility] ENDOSCOPY;  Service: Endoscopy;  Laterality: N/A;  . ESOPHAGEAL DILATION    . ESOPHAGOGASTRODUODENOSCOPY (EGD) WITH PROPOFOL N/A 04/01/2018   Procedure: ESOPHAGOGASTRODUODENOSCOPY (EGD) WITH PROPOFOL;  Surgeon: Manya Silvas, MD;  Location: [facility] ENDOSCOPY;  Service: Endoscopy;  Laterality: N/A;  . LEFT HEART CATH AND CORS/GRAFTS ANGIOGRAPHY N/A 06/12/2019   Procedure: LEFT HEART CATH AND CORS/GRAFTS ANGIOGRAPHY;  Surgeon: Teodoro Spray, MD;  Location: [place] CV LAB;  Service: Cardiovascular;  Laterality: N/A;  . LEFT HEART CATH AND CORS/GRAFTS ANGIOGRAPHY N/A 03/11/2020   Procedure: LEFT HEART CATH AND CORS/GRAFTS ANGIOGRAPHY;  Surgeon: Isaias Cowman, MD;  Location: [place] CV LAB;  Service: Cardiovascular;  Laterality: N/A;  . TONSILLECTOMY    . VASCULAR SURGERY      FAMILY HISTORY: Family History  Problem Relation Age of Onset  . Heart attack Mother   . Depression Mother   . Heart disease Mother   . COPD Mother   . Hypertension Mother   . Heart attack Father   . Diabetes Father   .  Depression Father   . Heart disease Father   . Cirrhosis Father   . Parkinson's disease Brother     ADVANCED DIRECTIVES (Y/N):  N  HEALTH MAINTENANCE: Social History   Tobacco Use  . Smoking status: Former Smoker    Packs/day: 3.00    Years: 50.00    Pack years: 150.00    Types: Cigarettes    Quit date: 04/22/2013    Years since quitting: 7.5  . Smokeless tobacco: Never Used  Vaping Use  . Vaping Use: Never used  Substance Use Topics  . Alcohol use: No    Comment: remotely quit alcohol use. Hx of heavy alcohol use.  . Drug use: Yes    Types: Marijuana    Comment: occasionally     Colonoscopy:  PAP:  Bone density:  Lipid panel:  Allergies  Allergen Reactions  . Demerol  [Meperidine Hcl]   . Demerol [Meperidine] Hives  . Jardiance [Empagliflozin] Other (See Comments)    Perineal pain  . Prednisone Other (See Comments) and Hypertension    Pt states that this medication puts him  in A-fib   . Sulfa Antibiotics Hives  . Albuterol Sulfate [Albuterol] Palpitations and Other (See Comments)    Pt currently uses this medication.    . Morphine Sulfate Nausea And Vomiting, Rash and Other (See Comments)    Pt states that he is only allergic to the tablet form of this medication.      Current Outpatient Medications  Medication Sig Dispense Refill  . albuterol (VENTOLIN HFA) 108 (90 Base) MCG/ACT inhaler INHALE 2 PUFFS BY MOUTH EVERY 6 HOURS AS NEEDED FOR SHORTNESS OF BREATH (Patient taking differently: Inhale 2 puffs into the lungs every 6 (six) hours as needed for wheezing or shortness of breath. ) 18 g 5  . allopurinol (ZYLOPRIM) 300 MG tablet TAKE 1 TABLET BY MOUTH TWICE A DAY (Patient taking differently: Take 300 mg by mouth 2 (two) times daily. ) 180 tablet 0  . ALPRAZolam (XANAX) 1 MG tablet TAKE ONE TABLET BY MOUTH THREE TIMES A DAY (Patient taking differently: Take 1 mg by mouth 3 (three) times daily. ) 90 tablet 3  . amLODipine (NORVASC) 2.5 MG tablet Take 1 tablet (2.5  mg total) by mouth daily. 90 tablet 3  . aspirin 81 MG chewable tablet Chew 81 mg by mouth daily.    Marland Kitchen atorvastatin (LIPITOR) 80 MG tablet TAKE ONE TABLET BY MOUTH AT BEDTIME (Patient taking differently: Take 80 mg by mouth at bedtime. ) 90 tablet 1  . BREO ELLIPTA 100-25 MCG/INH AEPB INHALE 1 PUFF INTO THE LUNGS DAILY (Patient taking differently: Inhale 1 puff into the lungs daily. ) 60 each 5  . budesonide (PULMICORT) 0.5 MG/2ML nebulizer solution Take 0.5 mg by nebulization 2 (two) times daily as needed (shortness of breath or wheezing).     . cetirizine (ZYRTEC) 10 MG tablet Take 1 tablet (10 mg total) by mouth at bedtime. 90 tablet 1  . Cholecalciferol (VITAMIN D3) 125 MCG (5000 UT) CAPS Take 5,000 Units by mouth daily.     Marland Kitchen docusate sodium (COLACE) 100 MG capsule Take 100 mg by mouth daily.     Marland Kitchen ELIQUIS 5 MG TABS tablet TAKE 1 TABLET BY MOUTH TWICE A DAY 180 tablet 3  . FARXIGA 10 MG TABS tablet TAKE 1 TABLET BY MOUTH DAILY (Patient taking differently: Take 10 mg by mouth daily. ) 30 tablet 12  . isosorbide mononitrate (IMDUR) 120 MG 24 hr tablet TAKE 1 TABLET BY MOUTH DAILY 90 tablet 0  . LINZESS 72 MCG capsule Take 72 mcg by mouth daily before breakfast.     . lisinopril (ZESTRIL) 10 MG tablet TAKE 1 TABLET BY MOUTH DAILY (Patient taking differently: Take 10 mg by mouth daily. ) 90 tablet 4  . magnesium oxide (MAG-OX) 400 (241.3 Mg) MG tablet Take 1 tablet (400 mg total) by mouth every morning. 30 tablet 5  . metaxalone (SKELAXIN) 800 MG tablet Take 1 tablet (800 mg total) by mouth 3 (three) times daily as needed (pain). 30 tablet 3  . metFORMIN (GLUCOPHAGE) 1000 MG tablet Take 1,000 mg by mouth 2 (two) times daily with a meal.    . nitroGLYCERIN (NITROSTAT) 0.4 MG SL tablet PLACE ONE TABLET UNDER THE TONGUE EVERY 5 MINUTES FOR CHEST PAIN. IF NO RELIEF PAST 3RD TAB GO TO EMERGENCY ROOM (Patient taking differently: Place 0.4 mg under the tongue every 5 (five) minutes as needed for chest  pain. ) 30 tablet 3  . omega-3 acid ethyl esters (LOVAZA) 1 g capsule TAKE 4 CAPSULES BY MOUTH DAILY (  Patient taking differently: Take 4 g by mouth daily. ) 120 capsule 12  . omeprazole (PRILOSEC) 40 MG capsule Take 40 mg by mouth daily.     . ondansetron (ZOFRAN-ODT) 4 MG disintegrating tablet Take 4 mg by mouth as directed.    Marland Kitchen oxyCODONE-acetaminophen (PERCOCET) 10-325 MG tablet TAKE 1 TABLET BY MOUTH EVERY 4 HOURS AS NEEDED FOR PAIN 150 tablet 0  . ranolazine (RANEXA) 1000 MG SR tablet TAKE 1 TABLET BY MOUTH TWICE A DAY (Patient taking differently: Take 1,000 mg by mouth 2 (two) times daily. ) 60 tablet 5  . sotalol (BETAPACE) 80 MG tablet Take 80 mg by mouth 2 (two) times daily.    Marland Kitchen SPIRIVA HANDIHALER 18 MCG inhalation capsule PLACE 1 CAPSULE INTO INHALER AND INHALE ONCE A DAY 30 capsule 0  . traZODone (DESYREL) 150 MG tablet TAKE ONE TABLET BY MOUTH AT BEDTIME (Patient taking differently: Take 150 mg by mouth at bedtime. ) 30 tablet 1  . venlafaxine XR (EFFEXOR-XR) 75 MG 24 hr capsule TAKE 1 CAPSULE BY MOUTH DAILY WITH BREAKFAST (Patient taking differently: Take 75 mg by mouth daily with breakfast. ) 90 capsule 4  . Lidocaine HCl 4 % CREA Apply to affected area two to three times evert day 80 mL 2  . naloxone (NARCAN) nasal spray 4 mg/0.1 mL 1 spray into nostril x1 and may repeat every 2-3 minutes until patient is responsive or EMS arrives (Patient not taking: Reported on 10/22/2020) 2 each 2  . torsemide (DEMADEX) 100 MG tablet Take 0.5 tablets (50 mg total) by mouth daily. (Patient not taking: Reported on 10/22/2020) 30 tablet 3   No current facility-administered medications for this visit.    OBJECTIVE: Vitals:   10/22/20 1130  BP: 129/74  Pulse: (!) 59  Resp: 18  Temp: 97.9 F (36.6 C)  SpO2: 91%     Body mass index is 34.62 kg/m.    ECOG FS:0 - Asymptomatic  General: Well-developed, well-nourished, no acute distress. Eyes: Pink conjunctiva, anicteric sclera. HEENT:  Normocephalic, moist mucous membranes. Lungs: No audible wheezing or coughing. Heart: Regular rate and rhythm. Abdomen: Soft, nontender, no obvious distention. Musculoskeletal: No edema, cyanosis, or clubbing. Neuro: Alert, answering all questions appropriately. Cranial nerves grossly intact. Skin: No rashes or petechiae noted. Psych: Normal affect. Lymphatics: No cervical, calvicular, axillary or inguinal LAD.   LAB RESULTS:  Lab Results  Component Value Date   NA 137 10/08/2020   K 3.9 10/08/2020   CL 101 10/08/2020   CO2 29 10/08/2020   GLUCOSE 180 (H) 10/08/2020   BUN 12 10/08/2020   CREATININE 0.88 10/08/2020   CALCIUM 8.9 10/08/2020   PROT 5.8 (L) 10/07/2020   ALBUMIN 3.3 (L) 10/07/2020   AST 15 10/07/2020   ALT 15 10/07/2020   ALKPHOS 53 10/07/2020   BILITOT 0.6 10/07/2020   GFRNONAA >60 10/08/2020   GFRAA 97 09/24/2020    Lab Results  Component Value Date   WBC 6.2 10/22/2020   NEUTROABS 3.0 10/08/2020   HGB 18.2 (H) 10/22/2020   HCT 55.1 (H) 10/22/2020   MCV 95.5 10/22/2020   PLT 150 10/22/2020     STUDIES: DG Chest 2 View  Result Date: 10/06/2020 CLINICAL DATA:  Chest pain EXAM: CHEST - 2 VIEW COMPARISON:  September 26, 2020 FINDINGS: Cardiomediastinal silhouette is normal. No pneumothorax. The hila and mediastinum are normal. No focal infiltrates. No overt edema. IMPRESSION: No active cardiopulmonary disease. Electronically Signed   By: Dorise Bullion III M.D  On: 10/06/2020 17:38   DG Chest 2 View  Result Date: 09/26/2020 CLINICAL DATA:  Chest pain EXAM: CHEST - 2 VIEW COMPARISON:  08/10/2020 FINDINGS: Post sternotomy changes. No focal opacity or pleural effusion. Stable cardiomediastinal silhouette with aortic atherosclerosis. No pneumothorax. IMPRESSION: No active cardiopulmonary disease. Electronically Signed   By: Donavan Foil M.D.   On: 09/26/2020 18:54   NM Myocar Multi W/Spect W/Wall Motion / EF  Result Date: 09/28/2020  Blood pressure  demonstrated a normal response to exercise.  There was no ST segment deviation noted during stress.  No T wave inversion was noted during stress.  This is an intermediate risk study.  Findings consistent with prior myocardial infarction.  The left ventricular ejection fraction is moderately decreased (30-44%).  Nuclear stress EF: 44%.  Adequate chemical stress Anterior apical septal hypokinesis Await nuclear images   CT ANGIO CHEST AORTA W/CM &/OR WO/CM  Result Date: 10/07/2020 CLINICAL DATA:  Chest pain, back pain.  Evaluate for dissection. EXAM: CT ANGIOGRAPHY CHEST WITH CONTRAST TECHNIQUE: Multidetector CT imaging of the chest was performed using the standard protocol during bolus administration of intravenous contrast. Multiplanar CT image reconstructions and MIPs were obtained to evaluate the vascular anatomy. CONTRAST:  38m OMNIPAQUE IOHEXOL 350 MG/ML SOLN COMPARISON:  08/11/2020 FINDINGS: Cardiovascular: No evidence of aortic aneurysm or dissection. Diffuse coronary artery and aortic atherosclerosis. Prior CABG. Heart is normal size. No large central pulmonary emboli. Mediastinum/Nodes: No mediastinal, hilar, or axillary adenopathy. Trachea and esophagus are unremarkable. Thyroid unremarkable. Lungs/Pleura: Lungs are clear. No focal airspace opacities or suspicious nodules. No effusions. Upper Abdomen: Imaging into the upper abdomen demonstrates no acute findings. Musculoskeletal: Chest wall soft tissues are unremarkable. No acute bony abnormality. Review of the MIP images confirms the above findings. IMPRESSION: No aneurysm or dissection. No acute cardiopulmonary disease. Aortic Atherosclerosis (ICD10-I70.0). Electronically Signed   By: KRolm BaptiseM.D.   On: 10/07/2020 11:21   ECHOCARDIOGRAM COMPLETE  Result Date: 09/28/2020    ECHOCARDIOGRAM REPORT   [facility]   [facility] 09/28/2020 Medical Rec #:  [id number]      Height:       67.0 in Accession #:    [id number]      Weight:       216.5 lb Date of Birth:  230-Dec-1957      BSA:          2.092 m Patient Age:    89years        BP:           114/71 mmHg Patient Gender: M               HR:           61 bpm. Exam Location:  [facility] Procedure: 2D Echo and Intracardiac Opacification Agent Indications:     NSTEMI I21.4  History:         Patient has prior history of Echocardiogram examinations, most                  recent 03/08/2020.  Sonographer:     TArville GoRDCS Referring Phys:  [id number] RilesDiagnosing Phys: DYolonda KidaMD  Sonographer Comments: Technically difficult study due to poor echo windows. Image acquisition challenging due to patient body habitus. IMPRESSIONS  1. Left ventricular ejection fraction, by estimation, is 45 to 50%. The left ventricle has mildly decreased function. The left ventricle demonstrates regional wall motion abnormalities (see  scoring diagram/findings for description). The left ventricular  internal cavity size was mildly dilated. Left ventricular diastolic parameters were normal.  2. Right ventricular systolic function is normal. The right ventricular size is normal.  3. Left atrial size was mildly dilated.  4. Right atrial size was mildly dilated.  5. The mitral valve is normal in structure. Trivial mitral valve regurgitation.  6. The aortic valve is grossly normal. Aortic valve regurgitation is trivial. FINDINGS  Left Ventricle: Left ventricular ejection fraction, by estimation, is 45 to 50%. The left ventricle has mildly decreased function. The left ventricle demonstrates regional wall motion abnormalities. Definity contrast agent was given IV to delineate the left ventricular endocardial borders. The left ventricular internal cavity size was mildly dilated. There is no left ventricular hypertrophy. Left ventricular diastolic parameters were normal.  LV Wall Scoring: The mid anteroseptal segment and apex are hypokinetic. Right Ventricle: The right ventricular size is normal. No  increase in right ventricular wall thickness. Right ventricular systolic function is normal. Left Atrium: Left atrial size was mildly dilated. Right Atrium: Right atrial size was mildly dilated. Pericardium: There is no evidence of pericardial effusion. Mitral Valve: The mitral valve is normal in structure. Trivial mitral valve regurgitation. Tricuspid Valve: The tricuspid valve is normal in structure. Tricuspid valve regurgitation is mild. Aortic Valve: The aortic valve is grossly normal. Aortic valve regurgitation is trivial. Aortic valve peak gradient measures 6.2 mmHg. Pulmonic Valve: The pulmonic valve was grossly normal. Pulmonic valve regurgitation is not visualized. Aorta: The aortic root is normal in size and structure. IAS/Shunts: No atrial level shunt detected by color flow Doppler.  LEFT VENTRICLE PLAX 2D LVIDd:         5.08 cm  Diastology LVIDs:         3.59 cm  LV e' medial:    6.64 cm/s LV PW:         1.32 cm  LV E/e' medial:  6.9 LV IVS:        1.15 cm  LV e' lateral:   9.46 cm/s LVOT diam:     1.90 cm  LV E/e' lateral: 4.8 LV SV:         64 LV SV Index:   31 LVOT Area:     2.84 cm  RIGHT VENTRICLE RV Basal diam:  3.66 cm RV S prime:     8.59 cm/s TAPSE (M-mode): 1.8 cm LEFT ATRIUM             Index       RIGHT ATRIUM           Index LA diam:        4.20 cm 2.01 cm/m  RA Area:     18.60 cm LA Vol (A2C):   50.3 ml 24.05 ml/m RA Volume:   58.50 ml  27.97 ml/m LA Vol (A4C):   36.8 ml 17.59 ml/m LA Biplane Vol: 46.4 ml 22.18 ml/m  AORTIC VALVE                PULMONIC VALVE AV Area (Vmax): 2.52 cm    PV Vmax:       0.81 m/s AV Vmax:        124.00 cm/s PV Peak grad:  2.7 mmHg AV Peak Grad:   6.2 mmHg LVOT Vmax:      110.00 cm/s LVOT Vmean:     69.000 cm/s LVOT VTI:       0.227 m  AORTA Ao Root diam: 3.00 cm Ao  Asc diam:  3.30 cm MITRAL VALVE MV Area (PHT): 2.71 cm    SHUNTS MV Decel Time: 280 msec    Systemic VTI:  0.23 m MV E velocity: 45.80 cm/s  Systemic Diam: 1.90 cm MV A velocity: 43.30 cm/s  MV E/A ratio:  1.06 Dwayne Prince Rome MD Electronically signed by Yolonda Kida MD Signature Date/Time: 09/28/2020/1:28:30 PM    Final     ASSESSMENT: Polycythemia.  PLAN:   1. Polycythemia: Patient hemoglobin continues to trend up and is now 18.2.  His erythropoietin level is inappropriately elevated and his iron stores are within normal limits except for mildly decreased ferritin.  Carbon monoxide levels are within normal limits.  JAK2 mutation has been drawn and is pending at time of dictation.  No intervention is needed at this time, but patient may require phlebotomy in the future.  Return to clinic in 3 weeks for further evaluation and discussion of his laboratory results.  Patient expressed understanding and was in agreement with this plan. He also understands that He can call clinic at any time with any questions, concerns, or complaints.   Cancer Staging No matching staging information was found for the patient.  Lloyd Huger, MD   10/23/2020 5:50 PM

## 2020-10-21 ENCOUNTER — Telehealth: Payer: Self-pay | Admitting: Family Medicine

## 2020-10-21 DIAGNOSIS — I214 Non-ST elevation (NSTEMI) myocardial infarction: Secondary | ICD-10-CM | POA: Diagnosis not present

## 2020-10-21 DIAGNOSIS — I480 Paroxysmal atrial fibrillation: Secondary | ICD-10-CM | POA: Diagnosis not present

## 2020-10-21 DIAGNOSIS — I25118 Atherosclerotic heart disease of native coronary artery with other forms of angina pectoris: Secondary | ICD-10-CM | POA: Diagnosis not present

## 2020-10-21 DIAGNOSIS — I509 Heart failure, unspecified: Secondary | ICD-10-CM | POA: Diagnosis not present

## 2020-10-21 DIAGNOSIS — I10 Essential (primary) hypertension: Secondary | ICD-10-CM | POA: Diagnosis not present

## 2020-10-21 DIAGNOSIS — J418 Mixed simple and mucopurulent chronic bronchitis: Secondary | ICD-10-CM | POA: Diagnosis not present

## 2020-10-21 NOTE — Telephone Encounter (Signed)
Will have it ready this week, probably Wednesday or thursday

## 2020-10-21 NOTE — Telephone Encounter (Signed)
Patient is calling to request if Dr. [name] can write a letter for of support for his emotional support dog. Please address to [facility]. Patient is unable to move the dog in until he receives the letter.  Please advise [phone] when the letter is ready the patient is able to come pick up the letter.

## 2020-10-21 NOTE — Telephone Encounter (Signed)
Please review. Thanks!  

## 2020-10-22 ENCOUNTER — Encounter: Payer: Self-pay | Admitting: Oncology

## 2020-10-22 ENCOUNTER — Inpatient Hospital Stay: Payer: Medicare HMO

## 2020-10-22 ENCOUNTER — Ambulatory Visit: Payer: Self-pay | Admitting: Family Medicine

## 2020-10-22 ENCOUNTER — Inpatient Hospital Stay: Payer: Medicare HMO | Attending: Oncology | Admitting: Oncology

## 2020-10-22 VITALS — BP 129/74 | HR 59 | Temp 97.9°F | Resp 18 | Wt 221.1 lb

## 2020-10-22 DIAGNOSIS — Z7901 Long term (current) use of anticoagulants: Secondary | ICD-10-CM | POA: Diagnosis not present

## 2020-10-22 DIAGNOSIS — Z8249 Family history of ischemic heart disease and other diseases of the circulatory system: Secondary | ICD-10-CM | POA: Diagnosis not present

## 2020-10-22 DIAGNOSIS — Z87442 Personal history of urinary calculi: Secondary | ICD-10-CM | POA: Insufficient documentation

## 2020-10-22 DIAGNOSIS — I2510 Atherosclerotic heart disease of native coronary artery without angina pectoris: Secondary | ICD-10-CM | POA: Diagnosis not present

## 2020-10-22 DIAGNOSIS — Z9049 Acquired absence of other specified parts of digestive tract: Secondary | ICD-10-CM | POA: Diagnosis not present

## 2020-10-22 DIAGNOSIS — Z8379 Family history of other diseases of the digestive system: Secondary | ICD-10-CM | POA: Diagnosis not present

## 2020-10-22 DIAGNOSIS — Z818 Family history of other mental and behavioral disorders: Secondary | ICD-10-CM | POA: Insufficient documentation

## 2020-10-22 DIAGNOSIS — Z833 Family history of diabetes mellitus: Secondary | ICD-10-CM | POA: Insufficient documentation

## 2020-10-22 DIAGNOSIS — I252 Old myocardial infarction: Secondary | ICD-10-CM | POA: Diagnosis not present

## 2020-10-22 DIAGNOSIS — Z86010 Personal history of colonic polyps: Secondary | ICD-10-CM | POA: Diagnosis not present

## 2020-10-22 DIAGNOSIS — E119 Type 2 diabetes mellitus without complications: Secondary | ICD-10-CM | POA: Diagnosis not present

## 2020-10-22 DIAGNOSIS — Z85828 Personal history of other malignant neoplasm of skin: Secondary | ICD-10-CM | POA: Diagnosis not present

## 2020-10-22 DIAGNOSIS — Z87891 Personal history of nicotine dependence: Secondary | ICD-10-CM | POA: Diagnosis not present

## 2020-10-22 DIAGNOSIS — M549 Dorsalgia, unspecified: Secondary | ICD-10-CM | POA: Insufficient documentation

## 2020-10-22 DIAGNOSIS — R079 Chest pain, unspecified: Secondary | ICD-10-CM | POA: Diagnosis not present

## 2020-10-22 DIAGNOSIS — D751 Secondary polycythemia: Secondary | ICD-10-CM

## 2020-10-22 DIAGNOSIS — Z79899 Other long term (current) drug therapy: Secondary | ICD-10-CM | POA: Diagnosis not present

## 2020-10-22 DIAGNOSIS — I5032 Chronic diastolic (congestive) heart failure: Secondary | ICD-10-CM | POA: Insufficient documentation

## 2020-10-22 DIAGNOSIS — I110 Hypertensive heart disease with heart failure: Secondary | ICD-10-CM | POA: Insufficient documentation

## 2020-10-22 DIAGNOSIS — I700 Atherosclerosis of aorta: Secondary | ICD-10-CM | POA: Diagnosis not present

## 2020-10-22 DIAGNOSIS — Z882 Allergy status to sulfonamides status: Secondary | ICD-10-CM | POA: Diagnosis not present

## 2020-10-22 DIAGNOSIS — Z888 Allergy status to other drugs, medicaments and biological substances status: Secondary | ICD-10-CM | POA: Insufficient documentation

## 2020-10-22 DIAGNOSIS — F419 Anxiety disorder, unspecified: Secondary | ICD-10-CM | POA: Diagnosis not present

## 2020-10-22 DIAGNOSIS — Z8269 Family history of other diseases of the musculoskeletal system and connective tissue: Secondary | ICD-10-CM | POA: Insufficient documentation

## 2020-10-22 DIAGNOSIS — Z836 Family history of other diseases of the respiratory system: Secondary | ICD-10-CM | POA: Insufficient documentation

## 2020-10-22 DIAGNOSIS — Z885 Allergy status to narcotic agent status: Secondary | ICD-10-CM | POA: Insufficient documentation

## 2020-10-22 LAB — CBC
HCT: 55.1 % — ABNORMAL HIGH (ref 39.0–52.0)
Hemoglobin: 18.2 g/dL — ABNORMAL HIGH (ref 13.0–17.0)
MCH: 31.5 pg (ref 26.0–34.0)
MCHC: 33 g/dL (ref 30.0–36.0)
MCV: 95.5 fL (ref 80.0–100.0)
Platelets: 150 10*3/uL (ref 150–400)
RBC: 5.77 MIL/uL (ref 4.22–5.81)
RDW: 13.5 % (ref 11.5–15.5)
WBC: 6.2 10*3/uL (ref 4.0–10.5)
nRBC: 0 % (ref 0.0–0.2)

## 2020-10-22 LAB — IRON AND TIBC
Iron: 82 ug/dL (ref 45–182)
Saturation Ratios: 19 % (ref 17.9–39.5)
TIBC: 435 ug/dL (ref 250–450)
UIBC: 353 ug/dL

## 2020-10-22 LAB — FERRITIN: Ferritin: 13 ng/mL — ABNORMAL LOW (ref 24–336)

## 2020-10-22 IMAGING — CR DG CHEST 2V
2 series · 2 of 2 positions shown · non-contrast
Comparison: 11/10/2018

CLINICAL DATA: Midsternal chest tightness. Coughing and shortness
of breath.

EXAM:
CHEST - 2 VIEW

[chest pa]
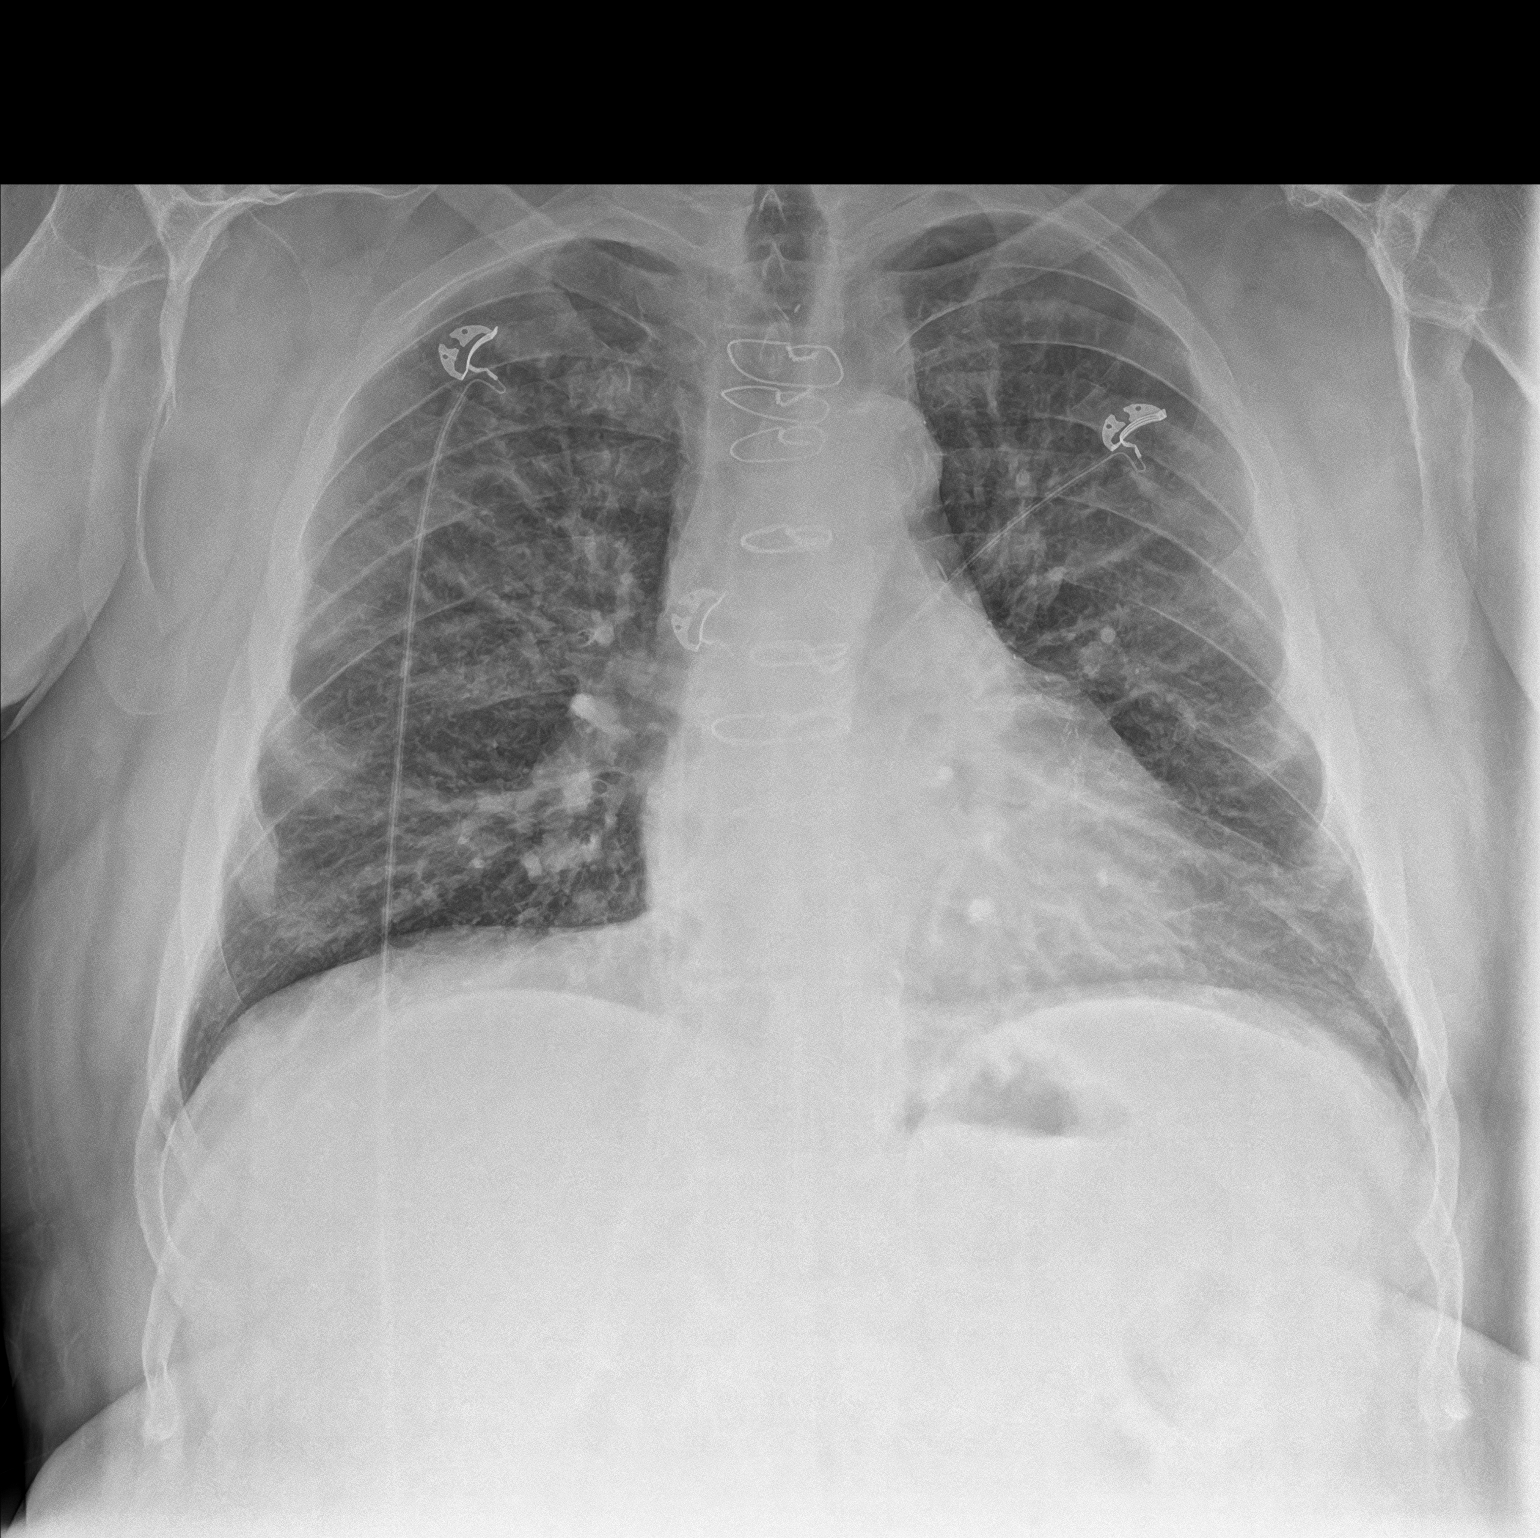

[chest lat]
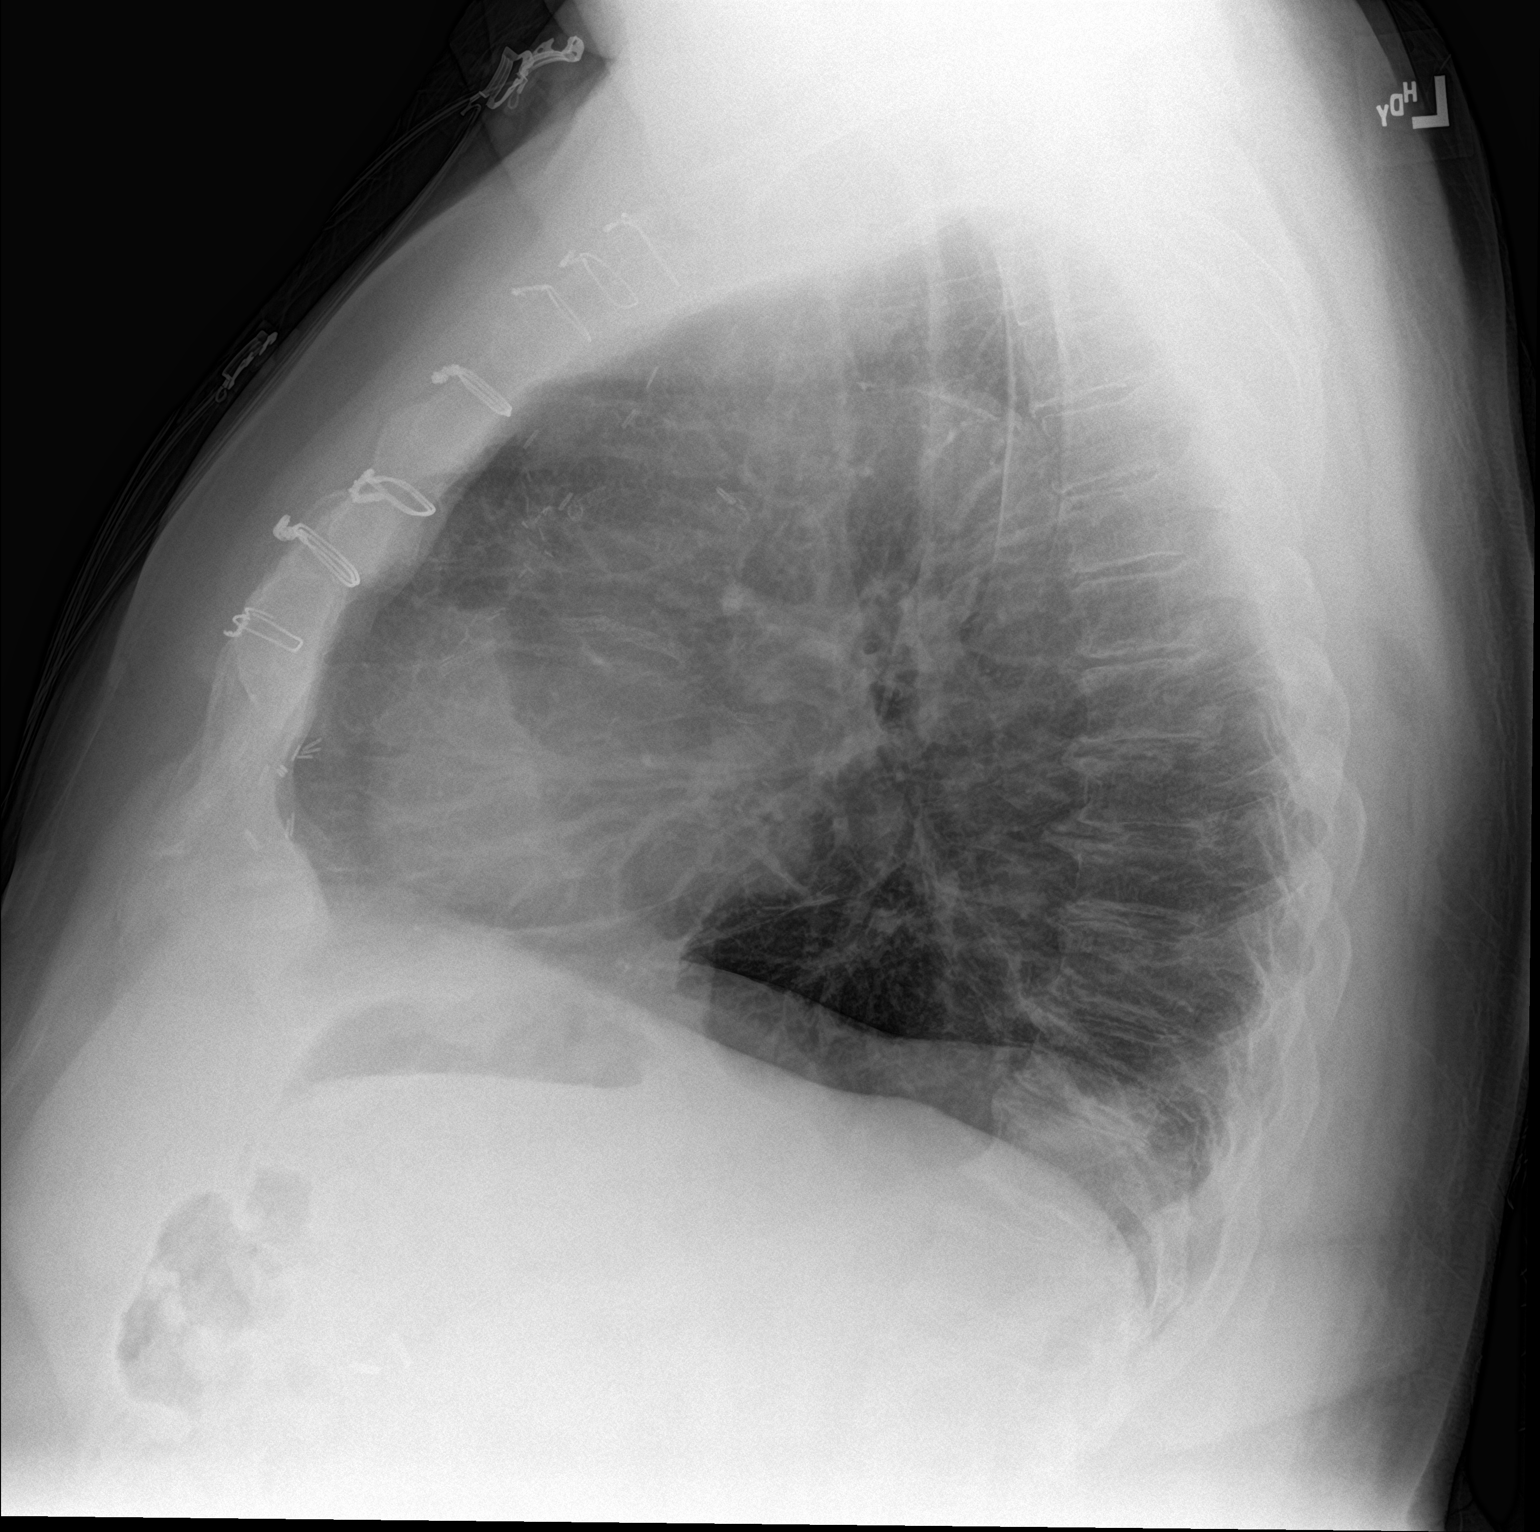

[2 of 2 positions shown; findings below may reference images not displayed]

FINDINGS: Postsurgical changes of CABG.

Mildly enlarged cardiac silhouette.

There is no evidence of focal airspace consolidation, pleural
effusion or pneumothorax. Mild peribronchial cuffing with central
predominance.

Osseous structures are without acute abnormality. Soft tissues are
grossly normal.
IMPRESSION: Mild peribronchial cuffing with central predominance, usually
associated with viral bronchitis or reactive airway disease.

## 2020-10-22 NOTE — Telephone Encounter (Signed)
Letter is complete

## 2020-10-22 NOTE — Telephone Encounter (Signed)
Pt called back to provide fax number: [phone]

## 2020-10-23 LAB — ERYTHROPOIETIN: Erythropoietin: 19.5 m[IU]/mL — ABNORMAL HIGH (ref 2.6–18.5)

## 2020-10-23 LAB — CARBON MONOXIDE, BLOOD (PERFORMED AT REF LAB): Carbon Monoxide, Blood: 1.7 % (ref 0.0–3.6)

## 2020-10-23 NOTE — Telephone Encounter (Signed)
Advised patient. Letter faxed.

## 2020-10-25 IMAGING — CR DG CHEST 2V
1 series · 2 of 2 positions shown · non-contrast
Comparison: Radiographs December 11, 2018.

CLINICAL DATA: Chest pain.

EXAM:
CHEST - 2 VIEW

[Series 1: dg chest 2 view · 0.14mm/px · 2 of 2 slices shown]
[im 1/2]
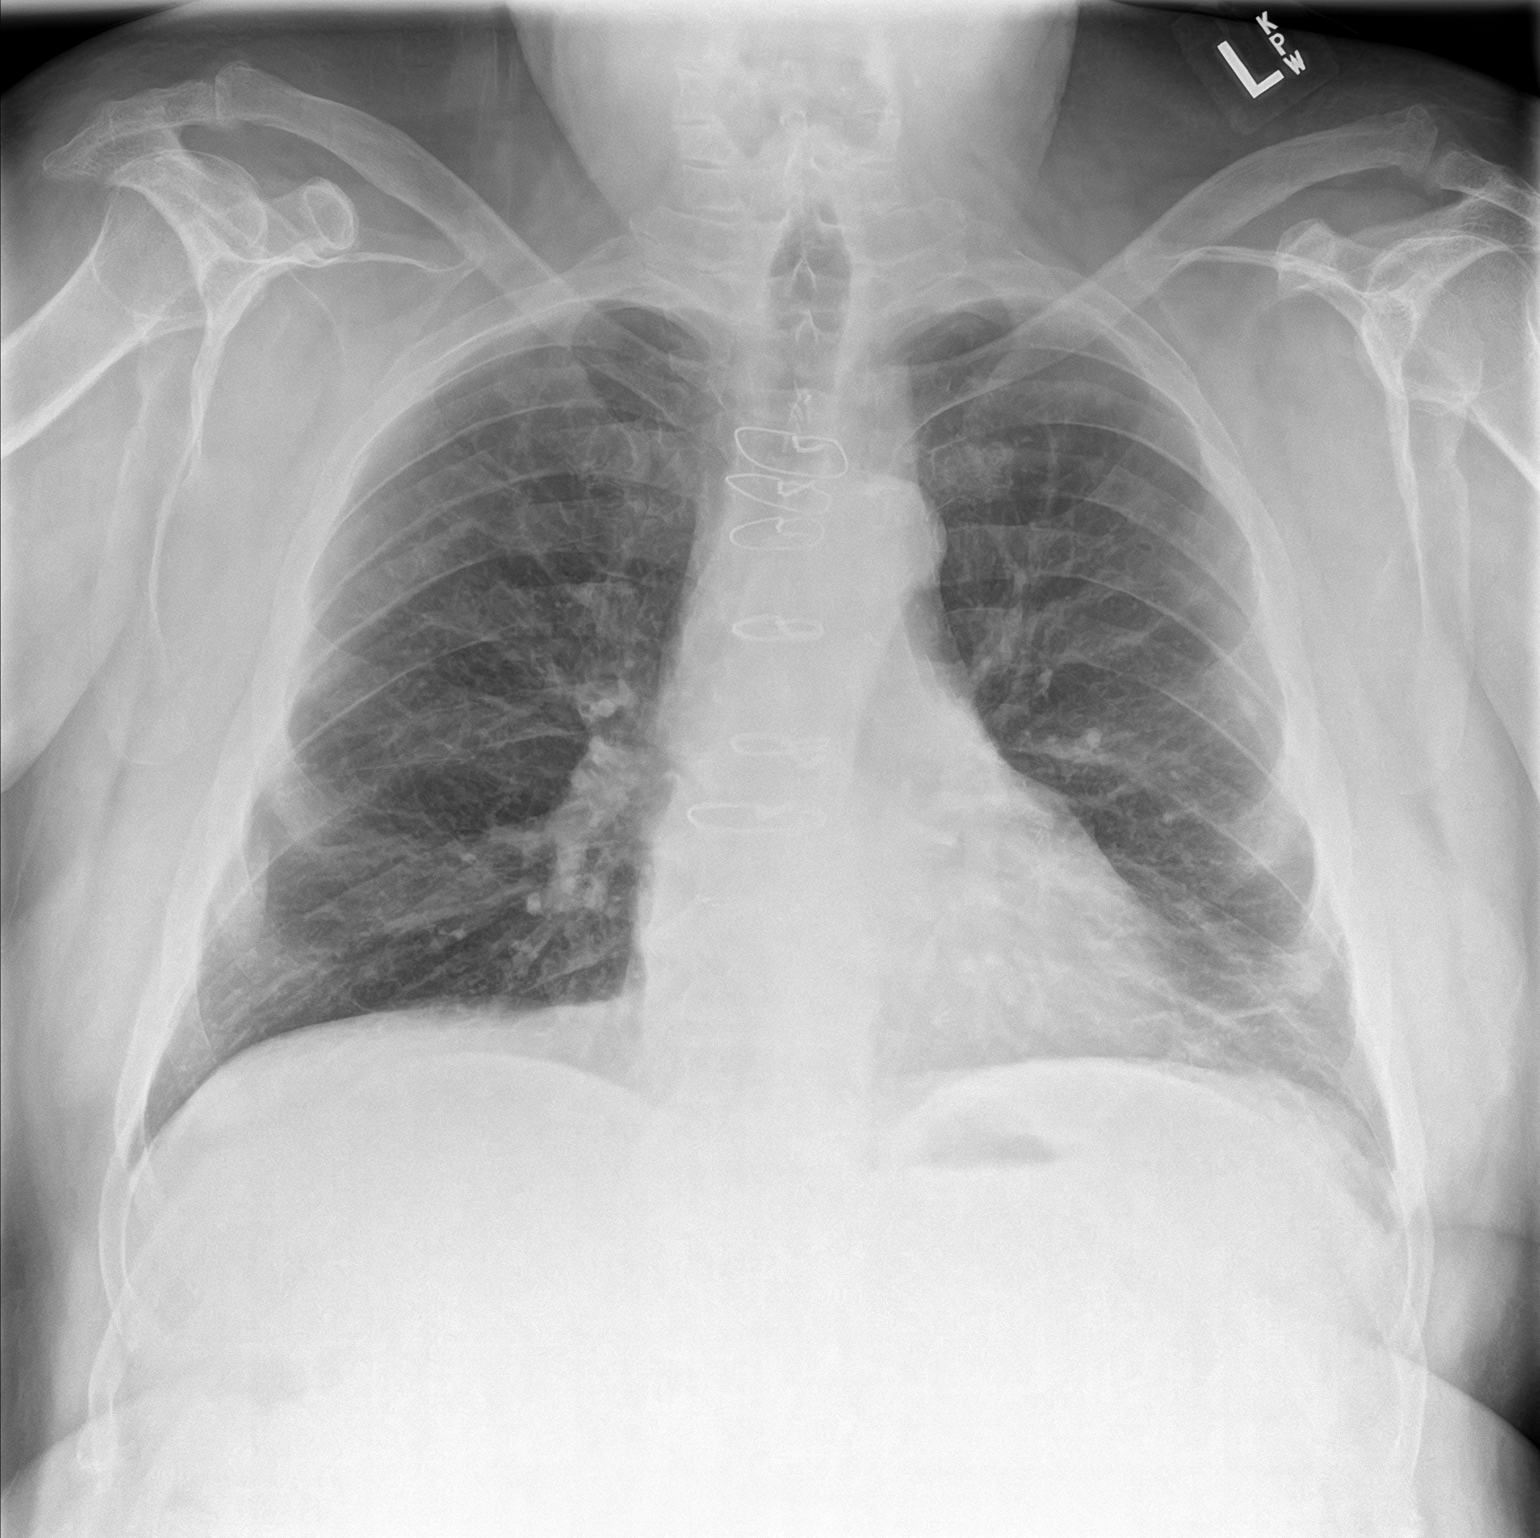
[im 2/2]
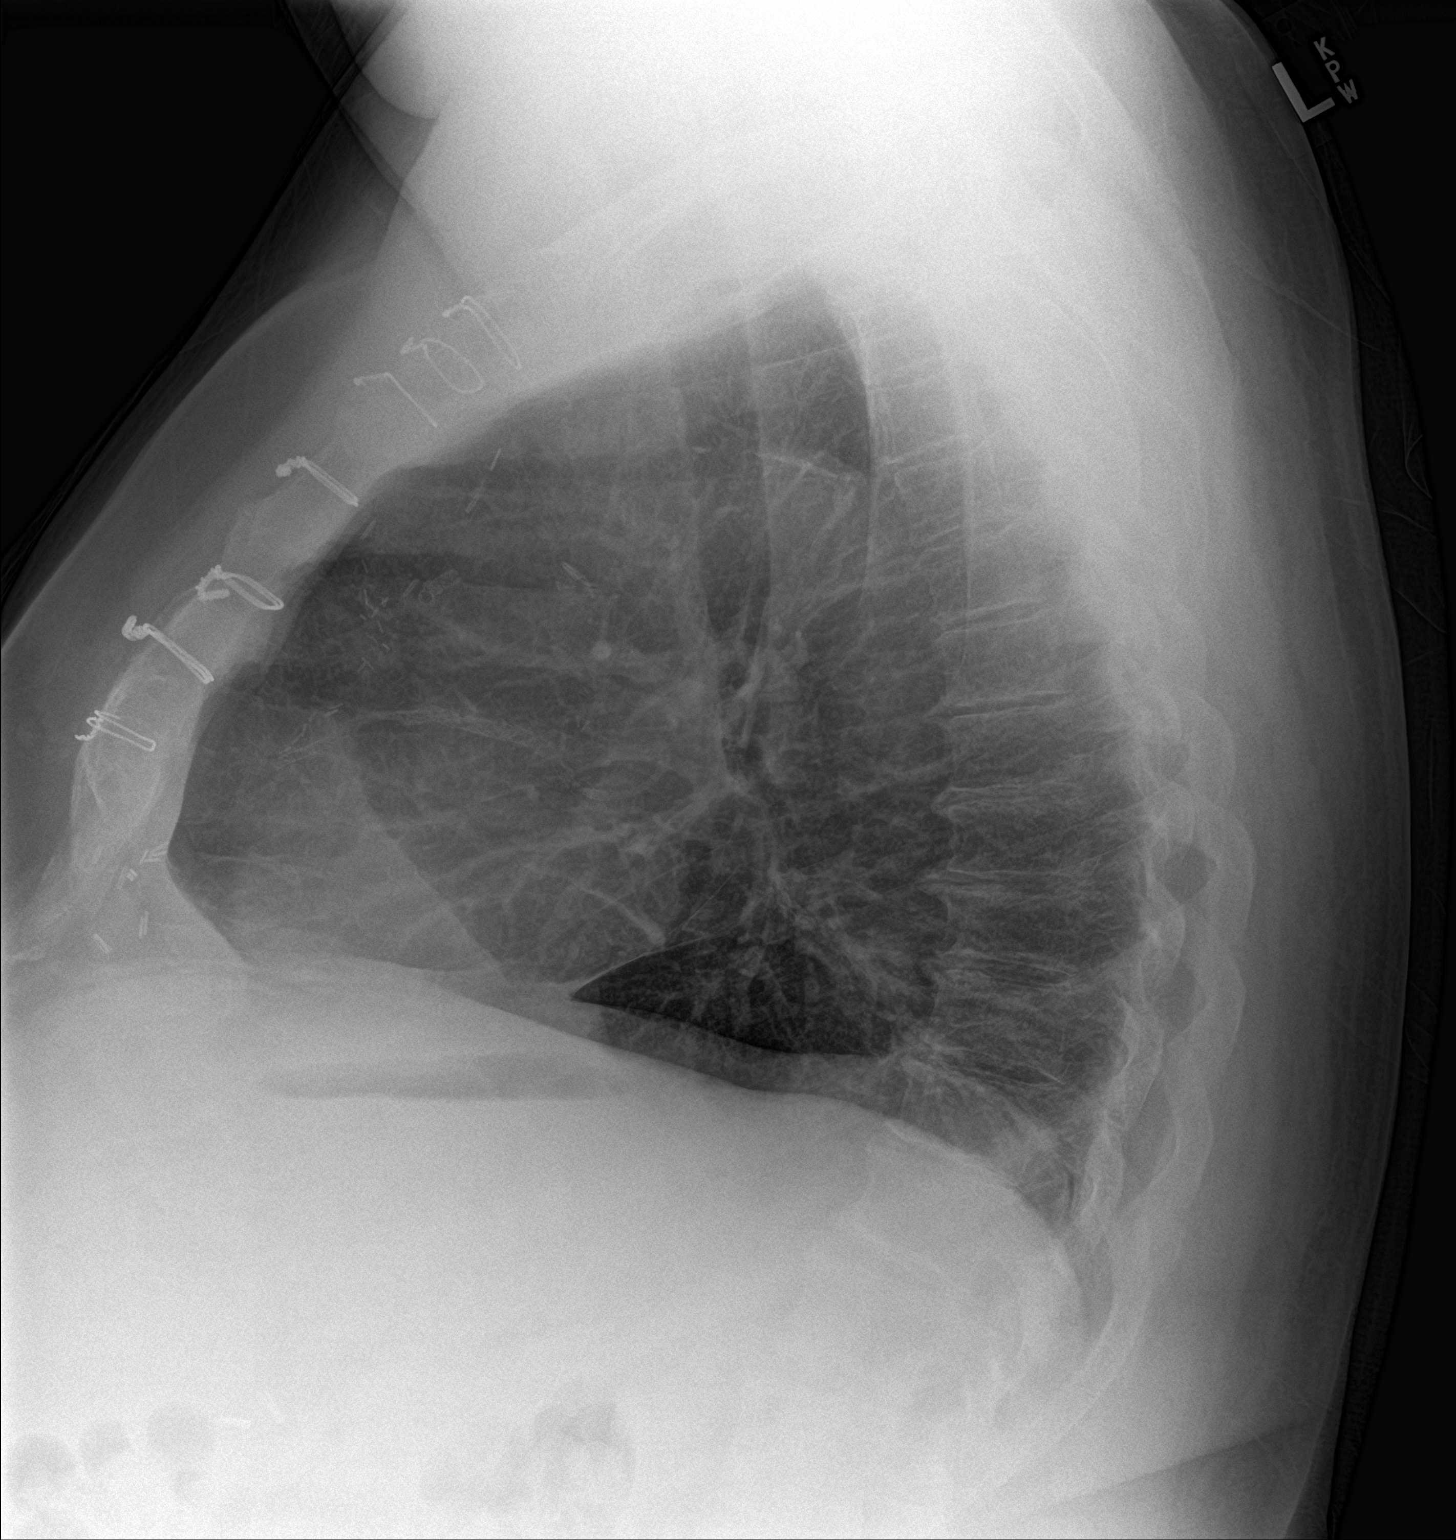

[2 of 2 positions shown; findings below may reference images not displayed]

FINDINGS: The heart size and mediastinal contours are within normal limits. No
pneumothorax or pleural effusion is noted. Sternotomy wires are
noted. Right lung is clear. Stable left basilar subsegmental
atelectasis or scarring. The visualized skeletal structures are
unremarkable.
IMPRESSION: Stable left basilar subsegmental atelectasis or scarring.

## 2020-10-27 LAB — JAK2 GENOTYPR

## 2020-10-29 LAB — HEMOCHROMATOSIS DNA-PCR(C282Y,H63D)

## 2020-11-01 ENCOUNTER — Other Ambulatory Visit: Payer: Self-pay | Admitting: Family Medicine

## 2020-11-01 NOTE — Telephone Encounter (Signed)
Medication Refill - Medication: oxyCODONE-acetaminophen (PERCOCET) 10-325 MG tablet  Pt is concern about the holiday schedules and hours and wants to make sure his refill is at the pharmacy by Thursday 12.30.21 so it can be there for him / please advise   Has the patient contacted their pharmacy? No. (Agent: If no, request that the patient contact the pharmacy for the refill.) (Agent: If yes, when and what did the pharmacy advise?)  Preferred Pharmacy (with phone number or street name): [place]: Please be advised that RX refills may take up to 3 business days. We ask that you follow-up with your pharmacy.

## 2020-11-01 NOTE — Telephone Encounter (Signed)
Requested medication (s) are due for refill today: no  Requested medication (s) are on the active medication list: yes   Last refill:  10/16/2020  Future visit scheduled: yes  Notes to clinic:  this refill cannot be delegated  Want to get before holiday    Requested Prescriptions  Pending Prescriptions Disp Refills   oxyCODONE-acetaminophen (PERCOCET) 10-325 MG tablet 150 tablet 0    Sig: Take 1 tablet by mouth every 4 (four) hours as needed. for pain      There is no refill protocol information for this order

## 2020-11-05 ENCOUNTER — Other Ambulatory Visit: Payer: Self-pay | Admitting: Family Medicine

## 2020-11-05 NOTE — Telephone Encounter (Signed)
Requested medication (s) are due for refill today: yes Requested medication (s) are on the active medication list: yes  Last refill:  10/16/2020  Future visit scheduled: yes  Notes to clinic:  this refill cannot be delegated    Requested Prescriptions  Pending Prescriptions Disp Refills   oxyCODONE-acetaminophen (PERCOCET) 10-325 MG tablet [Pharmacy Med Name: [id number] 10-325 MG TAB] 150 tablet     Sig: TAKE 1 TABLET BY MOUTH EVERY 4 HOURS AS NEEDED FOR PAIN      Not Delegated - Analgesics:  Opioid Agonist Combinations Failed - 11/05/2020  2:03 PM      Failed - This refill cannot be delegated      Passed - Urine Drug Screen completed in last 360 days      Passed - Valid encounter within last 6 months    Recent Outpatient Visits           2 weeks ago Chest wall pain   [facility] Birdie Sons, MD   1 month ago Type 2 diabetes mellitus with diabetic nephropathy, without long-term current use of insulin ([place])   [facility] Birdie Sons, MD   5 months ago Type 2 diabetes mellitus with diabetic nephropathy, without long-term current use of insulin ([place])   [facility] Birdie Sons, MD   7 months ago Insomnia, unspecified type   [facility] Birdie Sons, MD   10 months ago Type 2 diabetes mellitus with diabetic nephropathy, without long-term current use of insulin [facility])   [facility] Birdie Sons, MD       Future Appointments             In 3 weeks [name], [name], MD [facility], [place]

## 2020-11-06 ENCOUNTER — Other Ambulatory Visit: Payer: Self-pay | Admitting: Family Medicine

## 2020-11-10 NOTE — Progress Notes (Signed)
[place]  [phone] [phone]) [phone]  ID: [name] OB: [birth year]  MR#: [id number]  OMV#:[id number]  Patient Care Team: Birdie Sons, MD as PCP - General (Family Medicine) Allyne Gee, MD as Consulting Physician (Pulmonary Disease) Marisa Hua, PA-C as Physician Assistant Anell Barr, OD as Consulting Physician (Optometry) Ubaldo Glassing Javier Docker, MD as Consulting Physician (Cardiology) Lloyd Huger, MD as Consulting Physician (Oncology)  CHIEF COMPLAINT: Polycythemia.  INTERVAL HISTORY: Patient returns to clinic today for further evaluation and discussion of his laboratory results.  He continues to feel well and remains asymptomatic.  He has no neurologic complaints.  He denies any recent fevers or illnesses.  He has a good appetite and denies weight loss.  He has no chest pain, shortness of breath, cough, or hemoptysis.  He denies any nausea, vomiting, constipation, or diarrhea.  He has no urinary complaints.  Patient feels at his baseline offers no specific complaints today.  REVIEW OF SYSTEMS:   Review of Systems  Constitutional: Negative.  Negative for fever, malaise/fatigue and weight loss.  Respiratory: Negative.  Negative for cough, hemoptysis and shortness of breath.   Cardiovascular: Negative.  Negative for chest pain and leg swelling.  Gastrointestinal: Negative.  Negative for abdominal pain.  Genitourinary: Negative.  Negative for dysuria.  Musculoskeletal: Negative.  Negative for back pain.  Skin: Negative.  Negative for rash.  Neurological: Negative.  Negative for dizziness, focal weakness, weakness and headaches.  Psychiatric/Behavioral: Negative.  The patient is not nervous/anxious.     As per HPI. Otherwise, a complete review of systems is negative.  PAST MEDICAL HISTORY: Past Medical History:  Diagnosis Date  . A-fib ([place])   . Anemia   . Anginal pain ([place])   . Anxiety   . Arthritis   . Asthma   .  CAD (coronary artery disease)    a. 2002 CABGx2 (LIMA->LAD, VG->VG->OM1);  b. 09/2012 DES->OM;  c. 03/2015 PTCA of LAD [facility]) in setting of atretic LIMA; d. 05/2015 Cath [facility]): nonobs dzs; e. 06/2015 Cath ([facility]): LM nl, LAD 45p/d ISR, 50d, D1/2 small, LCX 50p/d ISR, OM1 70ost, 30 ISR, VG->OM1 50ost, 72m LIMA->LAD 99p/d - atretic, RCA dom, nl; f.cath 10/16: 40-50%(FFR 0.90) pLAD, 75% (FFR 0.77) mLAD s/p PCI/DES, oRCA 40% (FFR0.95)  . Cancer ([place]    SKIN CANCER ON BACK  . Celiac disease   . Chronic diastolic CHF (congestive heart failure) ([place]    a. 06/2009 Echo: EF 60-65%, Gr 1 DD, triv AI, mildly dil LA, nl RV.  .Marland KitchenCOPD (chronic obstructive pulmonary disease) ([place]    a. Chronic bronchitis and emphysema.  . DDD (degenerative disc disease), lumbar   . Diverticulosis   . Dysrhythmia   . Essential hypertension   . GERD (gastroesophageal reflux disease)   . History of hiatal hernia   . History of kidney stones    H/O  . History of tobacco abuse    a. Quit 2014.  .Marland KitchenMyocardial infarction ([place] 2002   4 STENTS  . Pancreatitis   . PSVT (paroxysmal supraventricular tachycardia) ([place]    a. 10/2012 Noted on Zio Patch.  . Sleep apnea    LOST CORD TO CPAP -ONLY 02 @ BEDTIME  . Tubular adenoma of colon   . Type II diabetes mellitus ([place]     PAST SURGICAL HISTORY: Past Surgical History:  Procedure Laterality Date  . BYPASS GRAFT    . CARDIAC CATHETERIZATION N/A 07/12/2015   rocedure: Left Heart  Cath and Cors/Grafts Angiography;  Surgeon: Belva Crome, MD;  Location: [place] CV LAB;  Service: Cardiovascular;  Laterality: N/A;  . CARDIAC CATHETERIZATION Right 10/07/2015   Procedure: Left Heart Cath and Cors/Grafts Angiography;  Surgeon: Dionisio David, MD;  Location: [facility] CV LAB;  Service: Cardiovascular;  Laterality: Right;  . CARDIAC CATHETERIZATION N/A 04/06/2016   Procedure: Left Heart Cath and Coronary Angiography;  Surgeon: Yolonda Kida, MD;  Location: [place] CV  LAB;  Service: Cardiovascular;  Laterality: N/A;  . CARDIAC CATHETERIZATION  04/06/2016   Procedure: Bypass Graft Angiography;  Surgeon: Yolonda Kida, MD;  Location: [place] CV LAB;  Service: Cardiovascular;;  . CARDIAC CATHETERIZATION N/A 11/02/2016   Procedure: Left Heart Cath and Cors/Grafts Angiography and possible PCI;  Surgeon: Yolonda Kida, MD;  Location: [place] CV LAB;  Service: Cardiovascular;  Laterality: N/A;  . CARDIAC CATHETERIZATION N/A 11/02/2016   Procedure: Coronary Stent Intervention;  Surgeon: Yolonda Kida, MD;  Location: [place] CV LAB;  Service: Cardiovascular;  Laterality: N/A;  . CHOLECYSTECTOMY    . CIRCUMCISION N/A 06/09/2019   Procedure: CIRCUMCISION ADULT;  Surgeon: Billey Co, MD;  Location: [facility] ORS;  Service: Urology;  Laterality: N/A;  . COLONOSCOPY WITH PROPOFOL N/A 04/01/2018   Procedure: COLONOSCOPY WITH PROPOFOL;  Surgeon: Manya Silvas, MD;  Location: [facility] ENDOSCOPY;  Service: Endoscopy;  Laterality: N/A;  . ESOPHAGEAL DILATION    . ESOPHAGOGASTRODUODENOSCOPY (EGD) WITH PROPOFOL N/A 04/01/2018   Procedure: ESOPHAGOGASTRODUODENOSCOPY (EGD) WITH PROPOFOL;  Surgeon: Manya Silvas, MD;  Location: [facility] ENDOSCOPY;  Service: Endoscopy;  Laterality: N/A;  . LEFT HEART CATH AND CORS/GRAFTS ANGIOGRAPHY N/A 06/12/2019   Procedure: LEFT HEART CATH AND CORS/GRAFTS ANGIOGRAPHY;  Surgeon: Teodoro Spray, MD;  Location: [place] CV LAB;  Service: Cardiovascular;  Laterality: N/A;  . LEFT HEART CATH AND CORS/GRAFTS ANGIOGRAPHY N/A 03/11/2020   Procedure: LEFT HEART CATH AND CORS/GRAFTS ANGIOGRAPHY;  Surgeon: Isaias Cowman, MD;  Location: [place] CV LAB;  Service: Cardiovascular;  Laterality: N/A;  . TONSILLECTOMY    . VASCULAR SURGERY      FAMILY HISTORY: Family History  Problem Relation Age of Onset  . Heart attack Mother   . Depression Mother   . Heart disease Mother   . COPD Mother   . Hypertension Mother    . Heart attack Father   . Diabetes Father   . Depression Father   . Heart disease Father   . Cirrhosis Father   . Parkinson's disease Brother     ADVANCED DIRECTIVES (Y/N):  N  HEALTH MAINTENANCE: Social History   Tobacco Use  . Smoking status: Former Smoker    Packs/day: 3.00    Years: 50.00    Pack years: 150.00    Types: Cigarettes    Quit date: 04/22/2013    Years since quitting: 7.5  . Smokeless tobacco: Never Used  Vaping Use  . Vaping Use: Never used  Substance Use Topics  . Alcohol use: No    Comment: remotely quit alcohol use. Hx of heavy alcohol use.  . Drug use: Yes    Types: Marijuana    Comment: occasionally     Colonoscopy:  PAP:  Bone density:  Lipid panel:  Allergies  Allergen Reactions  . Demerol  [Meperidine Hcl]   . Demerol [Meperidine] Hives  . Jardiance [Empagliflozin] Other (See Comments)    Perineal pain  . Prednisone Other (See Comments) and Hypertension    Pt  states that this medication puts him in A-fib   . Sulfa Antibiotics Hives  . Albuterol Sulfate [Albuterol] Palpitations and Other (See Comments)    Pt currently uses this medication.    . Morphine Sulfate Nausea And Vomiting, Rash and Other (See Comments)    Pt states that he is only allergic to the tablet form of this medication.      Current Outpatient Medications  Medication Sig Dispense Refill  . albuterol (VENTOLIN HFA) 108 (90 Base) MCG/ACT inhaler INHALE 2 PUFFS BY MOUTH EVERY 6 HOURS AS NEEDED FOR SHORTNESS OF BREATH (Patient taking differently: Inhale 2 puffs into the lungs every 6 (six) hours as needed for wheezing or shortness of breath.) 18 g 5  . allopurinol (ZYLOPRIM) 300 MG tablet TAKE 1 TABLET BY MOUTH TWICE A DAY (Patient taking differently: Take 300 mg by mouth 2 (two) times daily.) 180 tablet 0  . ALPRAZolam (XANAX) 1 MG tablet TAKE ONE TABLET BY MOUTH THREE TIMES A DAY (Patient taking differently: Take 1 mg by mouth 3 (three) times daily.) 90 tablet 3  .  amLODipine (NORVASC) 2.5 MG tablet Take 1 tablet (2.5 mg total) by mouth daily. 90 tablet 3  . aspirin 81 MG chewable tablet Chew 81 mg by mouth daily.    Marland Kitchen atorvastatin (LIPITOR) 80 MG tablet TAKE ONE TABLET BY MOUTH AT BEDTIME (Patient taking differently: Take 80 mg by mouth at bedtime.) 90 tablet 1  . BREO ELLIPTA 100-25 MCG/INH AEPB INHALE 1 PUFF INTO THE LUNGS DAILY 60 each 2  . budesonide (PULMICORT) 0.5 MG/2ML nebulizer solution Take 0.5 mg by nebulization 2 (two) times daily as needed (shortness of breath or wheezing).     . cetirizine (ZYRTEC) 10 MG tablet Take 1 tablet (10 mg total) by mouth at bedtime. 90 tablet 1  . docusate sodium (COLACE) 100 MG capsule Take 100 mg by mouth daily.     Marland Kitchen ELIQUIS 5 MG TABS tablet TAKE 1 TABLET BY MOUTH TWICE A DAY 180 tablet 3  . FARXIGA 10 MG TABS tablet TAKE 1 TABLET BY MOUTH DAILY (Patient taking differently: Take 10 mg by mouth daily.) 30 tablet 12  . isosorbide mononitrate (IMDUR) 120 MG 24 hr tablet TAKE 1 TABLET BY MOUTH DAILY 90 tablet 0  . Lidocaine HCl 4 % CREA Apply to affected area two to three times evert day 80 mL 2  . LINZESS 72 MCG capsule Take 72 mcg by mouth daily before breakfast.     . lisinopril (ZESTRIL) 10 MG tablet TAKE 1 TABLET BY MOUTH DAILY (Patient taking differently: Take 10 mg by mouth daily.) 90 tablet 4  . magnesium oxide (MAG-OX) 400 (241.3 Mg) MG tablet Take 1 tablet (400 mg total) by mouth every morning. 30 tablet 5  . metaxalone (SKELAXIN) 800 MG tablet Take 1 tablet (800 mg total) by mouth 3 (three) times daily as needed (pain). 30 tablet 3  . metFORMIN (GLUCOPHAGE) 1000 MG tablet Take 1,000 mg by mouth 2 (two) times daily with a meal.    . nitroGLYCERIN (NITROSTAT) 0.4 MG SL tablet PLACE ONE TABLET UNDER THE TONGUE EVERY 5 MINUTES FOR CHEST PAIN. IF NO RELIEF PAST 3RD TAB GO TO EMERGENCY ROOM (Patient taking differently: Place 0.4 mg under the tongue every 5 (five) minutes as needed for chest pain.) 30 tablet 3  .  omega-3 acid ethyl esters (LOVAZA) 1 g capsule TAKE 4 CAPSULES BY MOUTH DAILY (Patient taking differently: Take 4 g by mouth daily.) 120 capsule  12  . omeprazole (PRILOSEC) 40 MG capsule Take 40 mg by mouth daily.     Marland Kitchen oxyCODONE-acetaminophen (PERCOCET) 10-325 MG tablet TAKE 1 TABLET BY MOUTH EVERY 4 HOURS AS NEEDED FOR PAIN 150 tablet 0  . ranolazine (RANEXA) 1000 MG SR tablet TAKE 1 TABLET BY MOUTH TWICE A DAY (Patient taking differently: Take 1,000 mg by mouth 2 (two) times daily.) 60 tablet 5  . sotalol (BETAPACE) 80 MG tablet Take 80 mg by mouth 2 (two) times daily.    Marland Kitchen SPIRIVA HANDIHALER 18 MCG inhalation capsule PLACE 1 CAPSULE INTO INHALER AND INHALE BY MOUTH ONCE A DAY. 30 capsule 0  . torsemide (DEMADEX) 100 MG tablet Take 0.5 tablets (50 mg total) by mouth daily. 30 tablet 3  . traZODone (DESYREL) 150 MG tablet TAKE ONE TABLET BY MOUTH AT BEDTIME (Patient taking differently: Take 150 mg by mouth at bedtime.) 30 tablet 1  . venlafaxine XR (EFFEXOR-XR) 75 MG 24 hr capsule TAKE 1 CAPSULE BY MOUTH DAILY WITH BREAKFAST (Patient taking differently: Take 75 mg by mouth daily with breakfast.) 90 capsule 4  . Cholecalciferol (VITAMIN D3) 125 MCG (5000 UT) CAPS Take 5,000 Units by mouth daily.  (Patient not taking: Reported on 11/14/2020)    . naloxone (NARCAN) nasal spray 4 mg/0.1 mL 1 spray into nostril x1 and may repeat every 2-3 minutes until patient is responsive or EMS arrives (Patient not taking: No sig reported) 2 each 2   No current facility-administered medications for this visit.    OBJECTIVE: Vitals:   11/14/20 1048  BP: (!) 144/81  Pulse: (!) 59  Resp: 18  Temp: (!) 97 F (36.1 C)     Body mass index is 35.24 kg/m.    ECOG FS:0 - Asymptomatic  General: Well-developed, well-nourished, no acute distress. Eyes: Pink conjunctiva, anicteric sclera. HEENT: Normocephalic, moist mucous membranes. Lungs: No audible wheezing or coughing. Heart: Regular rate and rhythm. Abdomen:  Soft, nontender, no obvious distention. Musculoskeletal: No edema, cyanosis, or clubbing. Neuro: Alert, answering all questions appropriately. Cranial nerves grossly intact. Skin: No rashes or petechiae noted. Psych: Normal affect.   LAB RESULTS:  Lab Results  Component Value Date   NA 137 10/08/2020   K 3.9 10/08/2020   CL 101 10/08/2020   CO2 29 10/08/2020   GLUCOSE 180 (H) 10/08/2020   BUN 12 10/08/2020   CREATININE 0.88 10/08/2020   CALCIUM 8.9 10/08/2020   PROT 5.8 (L) 10/07/2020   ALBUMIN 3.3 (L) 10/07/2020   AST 15 10/07/2020   ALT 15 10/07/2020   ALKPHOS 53 10/07/2020   BILITOT 0.6 10/07/2020   GFRNONAA >60 10/08/2020   GFRAA 97 09/24/2020    Lab Results  Component Value Date   WBC 6.2 10/22/2020   NEUTROABS 3.0 10/08/2020   HGB 18.2 (H) 10/22/2020   HCT 55.1 (H) 10/22/2020   MCV 95.5 10/22/2020   PLT 150 10/22/2020     STUDIES: No results found.  ASSESSMENT: Polycythemia.  PLAN:   1. Polycythemia: Patient hemoglobin continues to trend up and is now 18.2.  His erythropoietin level is inappropriately elevated.  All of his other laboratory work is either negative or within normal limits including JAK2 mutation.  Given his inappropriately elevated erythropoietin level, will get CT scan of the abdomen and pelvis to assess for any renal lesions.  We will also repeat CBC and erythropoietin at that time.  Return to clinic 1 to 2 days after his imaging and laboratory work to discuss the results  and any other diagnostic testing necessary.   I spent a total of 20 minutes reviewing chart data, face-to-face evaluation with the patient, counseling and coordination of care as detailed above.  Patient expressed understanding and was in agreement with this plan. He also understands that He can call clinic at any time with any questions, concerns, or complaints.   Cancer Staging No matching staging information was found for the patient.  Lloyd Huger, MD    11/15/2020 8:08 AM

## 2020-11-14 ENCOUNTER — Inpatient Hospital Stay (HOSPITAL_BASED_OUTPATIENT_CLINIC_OR_DEPARTMENT_OTHER): Payer: Medicare HMO | Admitting: Oncology

## 2020-11-14 ENCOUNTER — Encounter: Payer: Self-pay | Admitting: Oncology

## 2020-11-14 VITALS — BP 144/81 | HR 59 | Temp 97.0°F | Resp 18 | Wt 225.0 lb

## 2020-11-14 DIAGNOSIS — I700 Atherosclerosis of aorta: Secondary | ICD-10-CM | POA: Diagnosis not present

## 2020-11-14 DIAGNOSIS — E119 Type 2 diabetes mellitus without complications: Secondary | ICD-10-CM | POA: Diagnosis not present

## 2020-11-14 DIAGNOSIS — D751 Secondary polycythemia: Secondary | ICD-10-CM | POA: Diagnosis not present

## 2020-11-14 DIAGNOSIS — I5032 Chronic diastolic (congestive) heart failure: Secondary | ICD-10-CM | POA: Diagnosis not present

## 2020-11-14 DIAGNOSIS — F419 Anxiety disorder, unspecified: Secondary | ICD-10-CM | POA: Diagnosis not present

## 2020-11-14 DIAGNOSIS — I110 Hypertensive heart disease with heart failure: Secondary | ICD-10-CM | POA: Diagnosis not present

## 2020-11-14 DIAGNOSIS — M549 Dorsalgia, unspecified: Secondary | ICD-10-CM | POA: Diagnosis not present

## 2020-11-14 DIAGNOSIS — I2510 Atherosclerotic heart disease of native coronary artery without angina pectoris: Secondary | ICD-10-CM | POA: Diagnosis not present

## 2020-11-14 DIAGNOSIS — I252 Old myocardial infarction: Secondary | ICD-10-CM | POA: Diagnosis not present

## 2020-11-14 NOTE — Progress Notes (Signed)
Pt in for follow up and test results.  

## 2020-11-23 ENCOUNTER — Other Ambulatory Visit: Payer: Self-pay

## 2020-11-23 ENCOUNTER — Encounter: Payer: Self-pay | Admitting: Emergency Medicine

## 2020-11-23 DIAGNOSIS — Z794 Long term (current) use of insulin: Secondary | ICD-10-CM | POA: Insufficient documentation

## 2020-11-23 DIAGNOSIS — Z85828 Personal history of other malignant neoplasm of skin: Secondary | ICD-10-CM | POA: Insufficient documentation

## 2020-11-23 DIAGNOSIS — Z79899 Other long term (current) drug therapy: Secondary | ICD-10-CM | POA: Insufficient documentation

## 2020-11-23 DIAGNOSIS — K760 Fatty (change of) liver, not elsewhere classified: Secondary | ICD-10-CM | POA: Diagnosis not present

## 2020-11-23 DIAGNOSIS — R109 Unspecified abdominal pain: Secondary | ICD-10-CM | POA: Insufficient documentation

## 2020-11-23 DIAGNOSIS — Z87891 Personal history of nicotine dependence: Secondary | ICD-10-CM | POA: Diagnosis not present

## 2020-11-23 DIAGNOSIS — E1121 Type 2 diabetes mellitus with diabetic nephropathy: Secondary | ICD-10-CM | POA: Diagnosis not present

## 2020-11-23 DIAGNOSIS — R0902 Hypoxemia: Secondary | ICD-10-CM | POA: Diagnosis not present

## 2020-11-23 DIAGNOSIS — D45 Polycythemia vera: Secondary | ICD-10-CM | POA: Diagnosis not present

## 2020-11-23 DIAGNOSIS — I214 Non-ST elevation (NSTEMI) myocardial infarction: Secondary | ICD-10-CM | POA: Diagnosis not present

## 2020-11-23 DIAGNOSIS — I129 Hypertensive chronic kidney disease with stage 1 through stage 4 chronic kidney disease, or unspecified chronic kidney disease: Secondary | ICD-10-CM | POA: Diagnosis not present

## 2020-11-23 DIAGNOSIS — Z951 Presence of aortocoronary bypass graft: Secondary | ICD-10-CM | POA: Diagnosis not present

## 2020-11-23 DIAGNOSIS — J45909 Unspecified asthma, uncomplicated: Secondary | ICD-10-CM | POA: Insufficient documentation

## 2020-11-23 DIAGNOSIS — I10 Essential (primary) hypertension: Secondary | ICD-10-CM | POA: Diagnosis not present

## 2020-11-23 DIAGNOSIS — I2510 Atherosclerotic heart disease of native coronary artery without angina pectoris: Secondary | ICD-10-CM | POA: Insufficient documentation

## 2020-11-23 DIAGNOSIS — N182 Chronic kidney disease, stage 2 (mild): Secondary | ICD-10-CM | POA: Insufficient documentation

## 2020-11-23 DIAGNOSIS — Z7901 Long term (current) use of anticoagulants: Secondary | ICD-10-CM | POA: Diagnosis not present

## 2020-11-23 DIAGNOSIS — I5031 Acute diastolic (congestive) heart failure: Secondary | ICD-10-CM | POA: Insufficient documentation

## 2020-11-23 DIAGNOSIS — M47814 Spondylosis without myelopathy or radiculopathy, thoracic region: Secondary | ICD-10-CM | POA: Diagnosis not present

## 2020-11-23 DIAGNOSIS — R079 Chest pain, unspecified: Secondary | ICD-10-CM | POA: Insufficient documentation

## 2020-11-23 DIAGNOSIS — R918 Other nonspecific abnormal finding of lung field: Secondary | ICD-10-CM | POA: Diagnosis not present

## 2020-11-23 DIAGNOSIS — I110 Hypertensive heart disease with heart failure: Secondary | ICD-10-CM | POA: Diagnosis not present

## 2020-11-23 DIAGNOSIS — Z7982 Long term (current) use of aspirin: Secondary | ICD-10-CM | POA: Insufficient documentation

## 2020-11-23 DIAGNOSIS — R0789 Other chest pain: Secondary | ICD-10-CM | POA: Diagnosis not present

## 2020-11-23 DIAGNOSIS — R000 Tachycardia, unspecified: Secondary | ICD-10-CM | POA: Diagnosis not present

## 2020-11-23 LAB — BASIC METABOLIC PANEL
Anion gap: 11 (ref 5–15)
BUN: 18 mg/dL (ref 8–23)
CO2: 27 mmol/L (ref 22–32)
Calcium: 9.2 mg/dL (ref 8.9–10.3)
Chloride: 97 mmol/L — ABNORMAL LOW (ref 98–111)
Creatinine, Ser: 0.91 mg/dL (ref 0.61–1.24)
GFR, Estimated: >60 mL/min (ref >60)
Glucose, Bld: 121 mg/dL — ABNORMAL HIGH (ref 70–99)
Potassium: 3.9 mmol/L (ref 3.5–5.1)
Sodium: 135 mmol/L (ref 135–145)

## 2020-11-23 LAB — TROPONIN I (HIGH SENSITIVITY)
Troponin I (High Sensitivity): 9 ng/L (ref <18)
Troponin I (High Sensitivity): 12 ng/L (ref <18)

## 2020-11-23 LAB — CBC
HCT: 56.7 % — ABNORMAL HIGH (ref 39.0–52.0)
Hemoglobin: 18.6 g/dL — ABNORMAL HIGH (ref 13.0–17.0)
MCH: 31.5 pg (ref 26.0–34.0)
MCHC: 32.8 g/dL (ref 30.0–36.0)
MCV: 95.9 fL (ref 80.0–100.0)
Platelets: 170 10*3/uL (ref 150–400)
RBC: 5.91 MIL/uL — ABNORMAL HIGH (ref 4.22–5.81)
RDW: 13 % (ref 11.5–15.5)
WBC: 9.8 10*3/uL (ref 4.0–10.5)
nRBC: 0 % (ref 0.0–0.2)

## 2020-11-23 NOTE — ED Triage Notes (Signed)
Pt in via EMS from home. Pt reports CP that is pressure like since thi am. Pt also reports kidney pain. Pt states his oncologist wants him to have a ct scan on his kidneys

## 2020-11-23 NOTE — ED Triage Notes (Signed)
Pt has appt scheduled for 1/17 for his kidneys but wants it done today while he is here

## 2020-11-24 ENCOUNTER — Emergency Department: Payer: Medicare HMO

## 2020-11-24 ENCOUNTER — Emergency Department
Admission: EM | Admit: 2020-11-24 | Discharge: 2020-11-24 | Disposition: A | Discharge status: Home / Self Care | Payer: Medicare HMO | Attending: Emergency Medicine | Admitting: Emergency Medicine

## 2020-11-24 ENCOUNTER — Encounter: Payer: Self-pay | Admitting: Radiology

## 2020-11-24 DIAGNOSIS — Z951 Presence of aortocoronary bypass graft: Secondary | ICD-10-CM | POA: Diagnosis not present

## 2020-11-24 DIAGNOSIS — D751 Secondary polycythemia: Secondary | ICD-10-CM

## 2020-11-24 DIAGNOSIS — D45 Polycythemia vera: Secondary | ICD-10-CM | POA: Diagnosis not present

## 2020-11-24 DIAGNOSIS — K760 Fatty (change of) liver, not elsewhere classified: Secondary | ICD-10-CM | POA: Diagnosis not present

## 2020-11-24 DIAGNOSIS — R079 Chest pain, unspecified: Secondary | ICD-10-CM

## 2020-11-24 DIAGNOSIS — R0789 Other chest pain: Secondary | ICD-10-CM | POA: Diagnosis not present

## 2020-11-24 DIAGNOSIS — M47814 Spondylosis without myelopathy or radiculopathy, thoracic region: Secondary | ICD-10-CM | POA: Diagnosis not present

## 2020-11-24 DIAGNOSIS — R109 Unspecified abdominal pain: Secondary | ICD-10-CM

## 2020-11-24 DIAGNOSIS — R918 Other nonspecific abnormal finding of lung field: Secondary | ICD-10-CM | POA: Diagnosis not present

## 2020-11-24 LAB — URINALYSIS, COMPLETE (UACMP) WITH MICROSCOPIC
Bacteria, UA: NONE SEEN
Bilirubin Urine: NEGATIVE
Glucose, UA: >=500 mg/dL — AB
Hgb urine dipstick: NEGATIVE
Ketones, ur: NEGATIVE mg/dL
Leukocytes,Ua: NEGATIVE
Nitrite: NEGATIVE
Protein, ur: NEGATIVE mg/dL
Specific Gravity, Urine: 1.031 — ABNORMAL HIGH (ref 1.005–1.030)
Squamous Epithelial / HPF: NONE SEEN (ref 0–5)
pH: 5 (ref 5.0–8.0)

## 2020-11-24 MED ORDER — OXYCODONE-ACETAMINOPHEN 5-325 MG PO TABS
1.0000 | ORAL_TABLET | Freq: Once | ORAL | Status: AC
Start: 2020-11-24 — End: 2020-11-24
  Administered 2020-11-24: 1 via ORAL
  Filled 2020-11-24: qty 1

## 2020-11-24 MED ORDER — ONDANSETRON HCL 4 MG/2ML IJ SOLN
4.0000 mg | Freq: Once | INTRAMUSCULAR | Status: AC
  Administered 2020-11-24: 4 mg via INTRAVENOUS
  Filled 2020-11-24: qty 2

## 2020-11-24 MED ORDER — MORPHINE SULFATE (PF) 4 MG/ML IV SOLN
4.0000 mg | Freq: Once | INTRAVENOUS | Status: AC
  Administered 2020-11-24: 4 mg via INTRAVENOUS
  Filled 2020-11-24: qty 1

## 2020-11-24 MED ORDER — IOHEXOL 300 MG/ML  SOLN
100.0000 mL | Freq: Once | INTRAMUSCULAR | Status: AC | PRN
  Administered 2020-11-24: 100 mL via INTRAVENOUS

## 2020-11-24 MED ORDER — IOHEXOL 9 MG/ML PO SOLN
500.0000 mL | Freq: Two times a day (BID) | ORAL | Status: DC | PRN
  Administered 2020-11-24 (×2): 500 mL via ORAL

## 2020-11-24 NOTE — Discharge Instructions (Signed)
Call your oncologist on Monday to let them know that you have already had your CT scan and to schedule a sooner follow-up appointment to discuss treatment options for your elevated red blood cell count.  Return to the ER for worsening symptoms, persistent vomiting, difficulty breathing or other concerns

## 2020-11-24 NOTE — ED Triage Notes (Signed)
Emergency Medicine Provider Triage Evaluation Note  Lawrence [name] , a 65 y.o. male  was evaluated in triage.  Pt complains of chest pain, kidney pain.  Seeing oncologist Dr. Grayland Ormond for recent diagnosis of polycythemia.  Has CT abdomen/pelvis scheduled for 12/02/2020 to evaluate for any renal lesions.  Review of Systems  Positive: Chest pain, left flank pain Negative: Shortness of breath, nausea/vomiting  Physical Exam  BP (!) 141/94   Pulse 69   Temp 97.8 F (36.6 C)   Resp 17   Ht 5' 7"  (1.702 m)   Wt 102.1 kg   SpO2 94%   BMI 35.24 kg/m  Gen:   Awake, mild distress   HEENT:  Atraumatic  Resp:  Normal effort  Cardiac:  Normal rate  Abd:   Nondistended, nontender mild left flank tenderness MSK:   Moves extremities without difficulty  Neuro:  Speech clear   Medical Decision Making  Medically screening exam initiated at 2:51 AM.  Appropriate orders placed.  JOMES GIRALDO was informed that the remainder of the evaluation will be completed by another provider, this initial triage assessment does not replace that evaluation, and the importance of remaining in the ED until their evaluation is complete.  Clinical Impression  65 year old male presenting with chest pain, left flank pain; recent diagnosis of polycythemia.  2 sets of negative troponins.  H/H slightly elevated for 1 month prior.  Will obtain CT abdomen/pelvis.  Administer IV analgesia.   Paulette Blanch, MD 11/24/20 [phone]

## 2020-11-24 NOTE — ED Provider Notes (Signed)
[facility] Emergency Department Provider Note   ____________________________________________   Event Date/Time   First MD Initiated Contact with Patient 11/24/20 [phone]     (approximate)  I have reviewed the triage vital signs and the nursing notes.   HISTORY  Chief Complaint Chest Pain    HPI Lawrence [name] is a 65 y.o. male brought to the ED via EMS from home with a chief complaint of chest pain, left kidney pain and requesting CT scan.  Patient with a history of CAD status post CABG, atrial fibrillation on Eliquis, COPD, recent diagnosis of polycythemia for which he is currently being worked up by oncology.  Has a CT abdomen/pelvis scheduled for 12/02/2020 to evaluate renal lesion which might be a cause of polycythemia.  Reports central chest pressure ongoing since yesterday morning not associated with diaphoresis, nausea/vomiting, palpitations or dizziness.  Denies fever, chills, cough, abdominal pain, dysuria or diarrhea.  Denies recent travel or trauma     Past Medical History:  Diagnosis Date  . A-fib ([place])   . Anemia   . Anginal pain ([place])   . Anxiety   . Arthritis   . Asthma   . CAD (coronary artery disease)    a. 2002 CABGx2 (LIMA->LAD, VG->VG->OM1);  b. 09/2012 DES->OM;  c. 03/2015 PTCA of LAD [facility]) in setting of atretic LIMA; d. 05/2015 Cath [facility]): nonobs dzs; e. 06/2015 Cath ([facility]): LM nl, LAD 45p/d ISR, 50d, D1/2 small, LCX 50p/d ISR, OM1 70ost, 30 ISR, VG->OM1 50ost, 54m LIMA->LAD 99p/d - atretic, RCA dom, nl; f.cath 10/16: 40-50%(FFR 0.90) pLAD, 75% (FFR 0.77) mLAD s/p PCI/DES, oRCA 40% (FFR0.95)  . Cancer ([place]    SKIN CANCER ON BACK  . Celiac disease   . Chronic diastolic CHF (congestive heart failure) ([place]    a. 06/2009 Echo: EF 60-65%, Gr 1 DD, triv AI, mildly dil LA, nl RV.  .Marland KitchenCOPD (chronic obstructive pulmonary disease) ([place]    a. Chronic bronchitis and emphysema.  . DDD (degenerative disc disease), lumbar   . Diverticulosis   .  Dysrhythmia   . Essential hypertension   . GERD (gastroesophageal reflux disease)   . History of hiatal hernia   . History of kidney stones    H/O  . History of tobacco abuse    a. Quit 2014.  .Marland KitchenMyocardial infarction ([place] 2002   4 STENTS  . Pancreatitis   . PSVT (paroxysmal supraventricular tachycardia) ([place]    a. 10/2012 Noted on Zio Patch.  . Sleep apnea    LOST CORD TO CPAP -ONLY 02 @ BEDTIME  . Tubular adenoma of colon   . Type II diabetes mellitus ([facility]     Patient Active Problem List   Diagnosis Date Noted  . Polycythemia 10/05/2020  . NSTEMI (non-ST elevated myocardial infarction) ([place] 09/26/2020  . Primary insomnia 05/13/2020  . Recurrent major depressive disorder, remission status unspecified ([place] 03/29/2020  . Elevated troponin 03/07/2020  . Bereavement 09/09/2019  . Chronic respiratory failure with hypoxia ([place] 05/29/2019  . Morbid obesity ([place] 05/29/2019  . Trigger thumb of right hand 11/28/2018  . Eye pain, left 08/18/2018  . Acute on chronic heart failure ([place] 04/18/2018  . History of adenomatous polyp of colon 04/05/2018  . Abdominal pain, chronic, epigastric 11/06/2017  . Bilateral flank pain 03/24/2017  . Dyspnea 04/03/2016  . Hypotension 04/03/2016  . CKD (chronic kidney disease) stage 2, GFR 60-89 ml/min 04/03/2016  . Anemia 04/03/2016  . Paroxysmal atrial fibrillation ([place] 12/23/2015  .  OSA (obstructive sleep apnea) 12/10/2015  . Left inguinal hernia 11/07/2015  . Anxiety 11/07/2015  . Unstable angina ([place]) 10/05/2015  . Back pain with left-sided radiculopathy 09/30/2015  . Nocturnal hypoxia 09/06/2015  . BPH (benign prostatic hyperplasia) 08/01/2015  . Chronic diastolic CHF (congestive heart failure) ([place])   . Angina pectoris ([place])   . Chest pain 07/11/2015  . COPD (chronic obstructive pulmonary disease) ([place]) 07/03/2015  . CAD (coronary artery disease) 06/26/2015  . HTN (hypertension) 06/26/2015  . 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 Achalasia 07/24/2014  . GERD (gastroesophageal reflux disease) 06/07/2014  . Former tobacco use 04/11/2013  . HLD (hyperlipidemia) 04/09/2013    Past Surgical History:  Procedure Laterality Date  . BYPASS GRAFT    . CARDIAC CATHETERIZATION N/A 07/12/2015   rocedure: Left Heart Cath and Cors/Grafts Angiography;  Surgeon: Belva Crome, MD;  Location: [place] [facility];  Service: Cardiovascular;  Laterality: N/A;  . CARDIAC CATHETERIZATION Right 10/07/2015   Procedure: Left Heart Cath and Cors/Grafts Angiography;  Surgeon: Dionisio David, MD;  Location: [place] [facility];  Service: Cardiovascular;  Laterality: Right;  . CARDIAC CATHETERIZATION N/A 04/06/2016   Procedure: Left Heart Cath and Coronary Angiography;  Surgeon: Yolonda Kida, MD;  Location: [place] [facility];  Service: Cardiovascular;  Laterality: N/A;  . CARDIAC CATHETERIZATION  04/06/2016   Procedure: Bypass Graft Angiography;  Surgeon: Yolonda Kida, MD;  Location: [place] [facility];  Service: Cardiovascular;;  . CARDIAC CATHETERIZATION N/A 11/02/2016   Procedure: Left Heart Cath and Cors/Grafts Angiography and possible PCI;  Surgeon: Yolonda Kida, MD;  Location: [place] [facility];  Service: Cardiovascular;  Laterality: N/A;  . CARDIAC CATHETERIZATION N/A 11/02/2016   Procedure: Coronary Stent Intervention;  Surgeon: Yolonda Kida, MD;  Location: [place] [facility];  Service: Cardiovascular;  Laterality: N/A;  . CHOLECYSTECTOMY    . CIRCUMCISION N/A 06/09/2019   Procedure: CIRCUMCISION ADULT;  Surgeon: Billey Co, MD;  Location: [facility] ORS;  Service: Urology;  Laterality: N/A;  . COLONOSCOPY WITH PROPOFOL N/A 04/01/2018   Procedure: COLONOSCOPY WITH PROPOFOL;  Surgeon: Manya Silvas, MD;  Location: [facility] ENDOSCOPY;  Service: Endoscopy;  Laterality: N/A;  . ESOPHAGEAL DILATION    . ESOPHAGOGASTRODUODENOSCOPY (EGD) WITH PROPOFOL N/A 04/01/2018   Procedure:  ESOPHAGOGASTRODUODENOSCOPY (EGD) WITH PROPOFOL;  Surgeon: Manya Silvas, MD;  Location: [facility] ENDOSCOPY;  Service: Endoscopy;  Laterality: N/A;  . LEFT HEART CATH AND CORS/GRAFTS ANGIOGRAPHY N/A 06/12/2019   Procedure: LEFT HEART CATH AND CORS/GRAFTS ANGIOGRAPHY;  Surgeon: Teodoro Spray, MD;  Location: [place] [facility];  Service: Cardiovascular;  Laterality: N/A;  . LEFT HEART CATH AND CORS/GRAFTS ANGIOGRAPHY N/A 03/11/2020   Procedure: LEFT HEART CATH AND CORS/GRAFTS ANGIOGRAPHY;  Surgeon: Isaias Cowman, MD;  Location: [place] [facility];  Service: Cardiovascular;  Laterality: N/A;  . TONSILLECTOMY    . VASCULAR SURGERY      Prior to Admission medications   Medication Sig Start Date End Date Taking? Authorizing Provider  albuterol (VENTOLIN HFA) 108 (90 Base) MCG/ACT inhaler INHALE 2 PUFFS BY MOUTH EVERY 6 HOURS AS NEEDED FOR SHORTNESS OF BREATH Patient taking differently: Inhale 2 puffs into the lungs every 6 (six) hours as needed for wheezing or shortness of breath. 05/13/20   Birdie Sons, MD  allopurinol (ZYLOPRIM) 300 MG tablet TAKE 1 TABLET BY MOUTH TWICE A DAY Patient taking differently: Take 300 mg by mouth 2 (two) times daily.  08/20/20   Birdie Sons, MD  ALPRAZolam Duanne Moron) 1 MG tablet TAKE ONE TABLET BY MOUTH THREE TIMES A DAY Patient taking differently: Take 1 mg by mouth 3 (three) times daily. 09/19/20   Birdie Sons, MD  amLODipine (NORVASC) 2.5 MG tablet Take 1 tablet (2.5 mg total) by mouth daily. 09/24/20   Birdie Sons, MD  aspirin 81 MG chewable tablet Chew 81 mg by mouth daily.    [provider]  atorvastatin (LIPITOR) 80 MG tablet TAKE ONE TABLET BY MOUTH AT BEDTIME Patient taking differently: Take 80 mg by mouth at bedtime. 07/19/20   Birdie Sons, MD  BREO ELLIPTA 100-25 MCG/INH AEPB INHALE 1 PUFF INTO THE LUNGS DAILY 11/06/20   Birdie Sons, MD  budesonide (PULMICORT) 0.5 MG/2ML nebulizer solution Take 0.5 mg by nebulization 2  (two) times daily as needed (shortness of breath or wheezing).     [provider]  cetirizine (ZYRTEC) 10 MG tablet Take 1 tablet (10 mg total) by mouth at bedtime. 05/01/20   Birdie Sons, MD  Cholecalciferol (VITAMIN D3) 125 MCG (5000 UT) CAPS Take 5,000 Units by mouth daily.  Patient not taking: Reported on 11/14/2020    [provider]  docusate sodium (COLACE) 100 MG capsule Take 100 mg by mouth daily.     [provider]  ELIQUIS 5 MG TABS tablet TAKE 1 TABLET BY MOUTH TWICE A DAY 05/01/20   [name], [name], MD  FARXIGA 10 MG TABS tablet TAKE 1 TABLET BY MOUTH DAILY Patient taking differently: Take 10 mg by mouth daily. 06/13/20   Birdie Sons, MD  isosorbide mononitrate (IMDUR) 120 MG 24 hr tablet TAKE 1 TABLET BY MOUTH DAILY 10/14/20   Birdie Sons, MD  Lidocaine HCl 4 % CREA Apply to affected area two to three times evert day 10/16/20   Birdie Sons, MD  LINZESS 72 MCG capsule Take 72 mcg by mouth daily before breakfast.     [provider]  lisinopril (ZESTRIL) 10 MG tablet TAKE 1 TABLET BY MOUTH DAILY Patient taking differently: Take 10 mg by mouth daily. 08/19/20   Birdie Sons, MD  magnesium oxide (MAG-OX) 400 (241.3 Mg) MG tablet Take 1 tablet (400 mg total) by mouth every morning. 06/13/20   Birdie Sons, MD  metaxalone (SKELAXIN) 800 MG tablet Take 1 tablet (800 mg total) by mouth 3 (three) times daily as needed (pain). 09/24/20   Birdie Sons, MD  metFORMIN (GLUCOPHAGE) 1000 MG tablet Take 1,000 mg by mouth 2 (two) times daily with a meal.    [provider]  naloxone (NARCAN) nasal spray 4 mg/0.1 mL 1 spray into nostril x1 and may repeat every 2-3 minutes until patient is responsive or EMS arrives Patient not taking: No sig reported 08/21/20   Birdie Sons, MD  nitroGLYCERIN (NITROSTAT) 0.4 MG SL tablet PLACE ONE TABLET UNDER THE TONGUE EVERY 5 MINUTES FOR CHEST PAIN. IF NO RELIEF PAST 3RD TAB GO TO EMERGENCY  ROOM Patient taking differently: Place 0.4 mg under the tongue every 5 (five) minutes as needed for chest pain. 05/10/20   Birdie Sons, MD  omega-3 acid ethyl esters (LOVAZA) 1 g capsule TAKE 4 CAPSULES BY MOUTH DAILY Patient taking differently: Take 4 g by mouth daily. 08/12/20   Birdie Sons, MD  omeprazole (PRILOSEC) 40 MG capsule Take 40 mg by mouth daily.  03/19/20   [provider]  oxyCODONE-acetaminophen (PERCOCET)  10-325 MG tablet TAKE 1 TABLET BY MOUTH EVERY 4 HOURS AS NEEDED FOR PAIN 11/05/20   Mar Daring, PA-C  ranolazine (RANEXA) 1000 MG SR tablet TAKE 1 TABLET BY MOUTH TWICE A DAY Patient taking differently: Take 1,000 mg by mouth 2 (two) times daily. 08/19/20   Birdie Sons, MD  sotalol (BETAPACE) 80 MG tablet Take 80 mg by mouth 2 (two) times daily.    [provider]  SPIRIVA HANDIHALER 18 MCG inhalation capsule PLACE 1 CAPSULE INTO INHALER AND INHALE BY MOUTH ONCE A DAY. 11/06/20   Birdie Sons, MD  torsemide (DEMADEX) 100 MG tablet Take 0.5 tablets (50 mg total) by mouth daily. 05/13/20   Birdie Sons, MD  traZODone (DESYREL) 150 MG tablet TAKE ONE TABLET BY MOUTH AT BEDTIME Patient taking differently: Take 150 mg by mouth at bedtime. 08/20/20   Birdie Sons, MD  venlafaxine XR (EFFEXOR-XR) 75 MG 24 hr capsule TAKE 1 CAPSULE BY MOUTH DAILY WITH BREAKFAST Patient taking differently: Take 75 mg by mouth daily with breakfast. 08/19/20   Birdie Sons, MD    Allergies Demerol  [meperidine hcl], Demerol [meperidine], Jardiance [empagliflozin], Prednisone, Sulfa antibiotics, Albuterol sulfate [albuterol], and Morphine sulfate  Family History  Problem Relation Age of Onset  . Heart attack Mother   . Depression Mother   . Heart disease Mother   . COPD Mother   . Hypertension Mother   . Heart attack Father   . Diabetes Father   . Depression Father   . Heart disease Father   . Cirrhosis Father   . Parkinson's disease Brother      Social History Social History   Tobacco Use  . Smoking status: Former Smoker    Packs/day: 3.00    Years: 50.00    Pack years: 150.00    Types: Cigarettes    Quit date: 04/22/2013    Years since quitting: 7.5  . Smokeless tobacco: Never Used  Vaping Use  . Vaping Use: Never used  Substance Use Topics  . Alcohol use: No    Comment: remotely quit alcohol use. Hx of heavy alcohol use.  . Drug use: Yes    Types: Marijuana    Comment: occasionally    Review of Systems  Constitutional: No fever/chills Eyes: No visual changes. ENT: No sore throat. Cardiovascular: Positive for chest pain. Respiratory: Denies shortness of breath. Gastrointestinal: Positive for left kidney pain.  No abdominal pain.  No nausea, no vomiting.  No diarrhea.  No constipation. Genitourinary: Negative for dysuria. Musculoskeletal: Negative for back pain. Skin: Negative for rash. Neurological: Negative for headaches, focal weakness or numbness.   ____________________________________________   PHYSICAL EXAM:  VITAL SIGNS: ED Triage Vitals  Enc Vitals Group     BP 11/23/20 1719 (!) 151/90     Pulse Rate 11/23/20 1719 74     Resp 11/23/20 1719 20     Temp 11/23/20 1719 98.2 F (36.8 C)     Temp Source 11/23/20 1719 Oral     SpO2 11/23/20 1719 94 %     Weight 11/23/20 1717 225 lb (102.1 kg)     Height 11/23/20 1717 5' 7"  (1.702 m)     Head Circumference --      Peak Flow --      Pain Score 11/23/20 1717 9     Pain Loc --      Pain Edu? --      Excl. in [place]? --  Constitutional: Alert and oriented. Well appearing and in no acute distress. Eyes: Conjunctivae are normal. PERRL. EOMI. Head: Atraumatic. Nose: No congestion/rhinnorhea. Mouth/Throat: Mucous membranes are moist.   Neck: No stridor.   Cardiovascular: Normal rate, regular rhythm. Grossly normal heart sounds.  Good peripheral circulation. Respiratory: Normal respiratory effort.  No retractions. Lungs CTAB. Gastrointestinal:  Soft and nontender to light or deep palpation. No distention. No abdominal bruits.  Mild left CVA tenderness. Musculoskeletal: No lower extremity tenderness nor edema.  No joint effusions. Neurologic:  Normal speech and language. No gross focal neurologic deficits are appreciated. No gait instability. Skin:  Skin is warm, dry and intact. No rash noted.  No petechiae. Psychiatric: Mood and affect are normal. Speech and behavior are normal.  ____________________________________________   LABS (all labs ordered are listed, but only abnormal results are displayed)  Labs Reviewed  BASIC METABOLIC PANEL - Abnormal; Notable for the following components:      Result Value   Chloride 97 (*)    Glucose, Bld 121 (*)    All other components within normal limits  CBC - Abnormal; Notable for the following components:   RBC 5.91 (*)    Hemoglobin 18.6 (*)    HCT 56.7 (*)    All other components within normal limits  URINALYSIS, COMPLETE (UACMP) WITH MICROSCOPIC - Abnormal; Notable for the following components:   Color, Urine YELLOW (*)    APPearance CLEAR (*)    Specific Gravity, Urine 1.031 (*)    Glucose, UA >=500 (*)    All other components within normal limits  TROPONIN I (HIGH SENSITIVITY)  TROPONIN I (HIGH SENSITIVITY)   ____________________________________________  EKG  ED ECG REPORT I, Bram Hottel J, the attending physician, personally viewed and interpreted this ECG.   Date: 11/24/2020  EKG Time: 1710  Rate: 76  Rhythm: normal EKG, normal sinus rhythm  Axis: Normal  Intervals:none  ST&T Change: Nonspecific No significant change from 10/06/2020 ____________________________________________  RADIOLOGY I, Exander Shaul J, personally viewed and evaluated these images (plain radiographs) as part of my medical decision making, as well as reviewing the written report by the radiologist.  ED MD interpretation: No acute cardiopulmonary process; CT not concerning for renal  lesions  Official radiology report(s): DG Chest 2 View  Result Date: 11/24/2020 CLINICAL DATA:  Chest pain/pressure. EXAM: CHEST - 2 VIEW COMPARISON:  Radiograph and CT 10/06/2020 FINDINGS: Post median sternotomy and CABG. The heart is normal in size. Unchanged mediastinal contours. Chronic hyperinflation with diaphragmatic flattening. No pulmonary edema, focal airspace disease, pleural fluid or pneumothorax. Thoracic spondylosis. No acute osseous abnormalities are seen. IMPRESSION: 1. No acute chest findings. 2. Post CABG. Chronic hyperinflation. Electronically Signed   By: Keith Rake M.D.   On: 11/24/2020 03:48   CT Abdomen Pelvis W Contrast  Result Date: 11/24/2020 CLINICAL DATA:  Flank pain with kidney stone suspected. Left upper quadrant pain in the setting of polycythemia EXAM: CT ABDOMEN AND PELVIS WITH CONTRAST TECHNIQUE: Multidetector CT imaging of the abdomen and pelvis was performed using the standard protocol following bolus administration of intravenous contrast. CONTRAST:  149m OMNIPAQUE IOHEXOL 300 MG/ML  SOLN COMPARISON:  05/26/2018 FINDINGS: Lower chest:  No contributory findings. Hepatobiliary: Hepatic steatosis without focal abnormality.Cholecystectomy without bile duct dilatation. Pancreas: Unremarkable. Spleen: Unremarkable. Adrenals/Urinary Tract: Stable mild bilateral adrenal lobulation. No hydronephrosis or stone. 6 mm right interpolar renal lesion which is too small for accurate densitometry, size stable from 2019. Unremarkable bladder. Stomach/Bowel: No obstruction. No appendicitis. Outpouching at the gastric cardia.  There is no history of fundoplication, most consistent with a small paraesophageal hernia. Mild distal colonic diverticulosis. Vascular/Lymphatic: No acute vascular abnormality. Atheromatous calcifications of the aorta and iliacs. No mass or adenopathy. Reproductive:No pathologic findings. Other: No ascites or pneumoperitoneum. Shallow, fatty left inguinal hernia.  Musculoskeletal: No acute abnormalities. IMPRESSION: 1. No worrisome renal lesion. There is a 6 mm right renal nodule that is too small for characterization but size stable from 2019. 2. Hepatic steatosis and mild colonic diverticulosis. 3.  Aortic Atherosclerosis (ICD10-I70.0). 4. Small paraesophageal hiatal hernia. 5. Fatty left inguinal hernia. Electronically Signed   By: Monte Fantasia M.D.   On: 11/24/2020 04:18    ____________________________________________   PROCEDURES  Procedure(s) performed (including Critical Care):  Procedures   ____________________________________________   INITIAL IMPRESSION / ASSESSMENT AND PLAN / ED COURSE  As part of my medical decision making, I reviewed the following data within the [place] notes reviewed and incorporated, Labs reviewed, EKG interpreted, Old chart reviewed, Radiograph reviewed and Notes from prior ED visits     65 year old male presenting with chest pain, left flank pain and requesting CT scan ordered by his oncologist. Differential diagnosis includes, but is not limited to, ACS, aortic dissection, pulmonary embolism, cardiac tamponade, pneumothorax, pneumonia, pericarditis, myocarditis, GI-related causes including esophagitis/gastritis, and musculoskeletal chest wall pain.    Laboratory results demonstrate stable H/H from 1 month prior, 2 sets of negative troponins.  No worrisome renal lesions on CT scan.  Will discharge home to follow-up with oncology and cardiology.  Strict return precautions given.  Patient verbalizes understanding agrees with plan of care.      ____________________________________________   FINAL CLINICAL IMPRESSION(S) / ED DIAGNOSES  Final diagnoses:  Nonspecific chest pain  Polycythemia  Flank pain     ED Discharge Orders    None      *Please note:  Lawrence [name] was evaluated in Emergency Department on 11/24/2020 for the symptoms described in the history of present  illness. He was evaluated in the context of the global COVID-19 pandemic, which necessitated consideration that the patient might be at risk for infection with the SARS-CoV-2 virus that causes COVID-19. Institutional protocols and algorithms that pertain to the evaluation of patients at risk for COVID-19 are in a state of rapid change based on information released by regulatory bodies including the CDC and federal and state organizations. These policies and algorithms were followed during the patient's care in the ED.  Some ED evaluations and interventions may be delayed as a result of limited staffing during and the pandemic.*   Note:  This document was prepared using Dragon voice recognition software and may include unintentional dictation errors.   Paulette Blanch, MD 11/24/20 [phone]

## 2020-11-24 NOTE — ED Notes (Signed)
Pt to CT

## 2020-11-25 ENCOUNTER — Other Ambulatory Visit: Payer: Self-pay | Admitting: Family Medicine

## 2020-11-25 DIAGNOSIS — G4700 Insomnia, unspecified: Secondary | ICD-10-CM

## 2020-11-26 ENCOUNTER — Other Ambulatory Visit: Payer: Self-pay | Admitting: Family Medicine

## 2020-11-26 ENCOUNTER — Ambulatory Visit: Payer: Self-pay | Admitting: Family Medicine

## 2020-11-26 DIAGNOSIS — M5410 Radiculopathy, site unspecified: Secondary | ICD-10-CM

## 2020-11-26 NOTE — Telephone Encounter (Signed)
Requested medication (s) are due for refill today - unsure  Requested medication (s) are on the active medication list -yes  Future visit scheduled -yes  Last refill: 11/15/20  Notes to clinic: Request non delegated rx  Requested Prescriptions  Pending Prescriptions Disp Refills   metaxalone (SKELAXIN) 800 MG tablet [Pharmacy Med Name: METAXALONE 800 MG TAB] 30 tablet 3    Sig: TAKE 1 TABLET BY MOUTH 3 TIMES A DAY AS NEEDED FOR PAIN      Not Delegated - Analgesics:  Muscle Relaxants Failed - 11/26/2020 10:33 AM      Failed - This refill cannot be delegated      Passed - Valid encounter within last 6 months    Recent Outpatient Visits           1 month ago Chest wall pain   [facility] Birdie Sons, MD   2 months ago Type 2 diabetes mellitus with diabetic nephropathy, without long-term current use of insulin ([place])   [facility] Birdie Sons, MD   6 months ago Type 2 diabetes mellitus with diabetic nephropathy, without long-term current use of insulin ([place])   [facility] Birdie Sons, MD   8 months ago Insomnia, unspecified type   [facility] Birdie Sons, MD   10 months ago Type 2 diabetes mellitus with diabetic nephropathy, without long-term current use of insulin ([place])   [place], [name], MD       Future Appointments             In 2 weeks [name], [name], MD [facility] [place]                 Requested Prescriptions  Pending Prescriptions Disp Refills   metaxalone (SKELAXIN) 800 MG tablet [Pharmacy Med Name: METAXALONE 800 MG TAB] 30 tablet 3    Sig: TAKE 1 TABLET BY MOUTH 3 TIMES A DAY AS NEEDED FOR PAIN      Not Delegated - Analgesics:  Muscle Relaxants Failed - 11/26/2020 10:33 AM      Failed - This refill cannot be delegated      Passed - Valid encounter within last 6 months    Recent Outpatient Visits           1 month ago Chest wall  pain   [facility] Birdie Sons, MD   2 months ago Type 2 diabetes mellitus with diabetic nephropathy, without long-term current use of insulin ([place])   [facility] Birdie Sons, MD   6 months ago Type 2 diabetes mellitus with diabetic nephropathy, without long-term current use of insulin [facility])   [facility] Birdie Sons, MD   8 months ago Insomnia, unspecified type   [facility] Birdie Sons, MD   10 months ago Type 2 diabetes mellitus with diabetic nephropathy, without long-term current use of insulin [facility])   [facility] Birdie Sons, MD       Future Appointments             In 2 weeks [name], [name], MD [name]

## 2020-12-02 ENCOUNTER — Ambulatory Visit: Payer: Medicare HMO

## 2020-12-02 ENCOUNTER — Other Ambulatory Visit: Payer: Self-pay | Admitting: Physician Assistant

## 2020-12-02 ENCOUNTER — Telehealth: Payer: Self-pay | Admitting: Oncology

## 2020-12-02 ENCOUNTER — Inpatient Hospital Stay: Payer: Medicare HMO

## 2020-12-02 NOTE — Telephone Encounter (Signed)
Patient called and wanted to reschedule his lab appointment today 1/17--(to be done prior to CT scan). Of note: his CT scan was completed on 1/9.

## 2020-12-02 NOTE — Telephone Encounter (Signed)
[place], thanks.  That's fine.

## 2020-12-02 NOTE — Telephone Encounter (Signed)
Requested medication (s) are due for refill today - unsure- 25 day supply given at last fill- if taken daily every 4 hours  Requested medication (s) are on the active medication list - yes  Future visit scheduled -yes  Last refill: 11/05/20#150  Notes to clinic: Request non delegated Rx  Requested Prescriptions  Pending Prescriptions Disp Refills   oxyCODONE-acetaminophen (PERCOCET) 10-325 MG tablet [Pharmacy Med Name: JGOTLXBWI-OMBT 10-325 MG TAB] 150 tablet     Sig: TAKE 1 TABLET BY MOUTH EVERY 4 HOURS AS NEEDED FOR PAIN      Not Delegated - Analgesics:  Opioid Agonist Combinations Failed - 12/02/2020  4:11 PM      Failed - This refill cannot be delegated      Passed - Urine Drug Screen completed in last 360 days      Passed - Valid encounter within last 6 months    Recent Outpatient Visits           1 month ago Chest wall pain   [facility] Birdie Sons, MD   2 months ago Type 2 diabetes mellitus with diabetic nephropathy, without long-term current use of insulin ([place])   [facility] Birdie Sons, MD   6 months ago Type 2 diabetes mellitus with diabetic nephropathy, without long-term current use of insulin ([place])   [facility] Birdie Sons, MD   8 months ago Insomnia, unspecified type   [facility] Birdie Sons, MD   11 months ago Type 2 diabetes mellitus with diabetic nephropathy, without long-term current use of insulin ([place])   [place], [name], MD       Future Appointments             In 2 weeks [name], [name], MD [facility], [place]                 Requested Prescriptions  Pending Prescriptions Disp Refills   oxyCODONE-acetaminophen (PERCOCET) 10-325 MG tablet [Pharmacy Med Name: OXYCODONE-APAP 10-325 MG TAB] 150 tablet     Sig: TAKE 1 TABLET BY MOUTH EVERY 4 HOURS AS NEEDED FOR PAIN      Not Delegated - Analgesics:  Opioid Agonist  Combinations Failed - 12/02/2020  4:11 PM      Failed - This refill cannot be delegated      Passed - Urine Drug Screen completed in last 360 days      Passed - Valid encounter within last 6 months    Recent Outpatient Visits           1 month ago Chest wall pain   [facility] Birdie Sons, MD   2 months ago Type 2 diabetes mellitus with diabetic nephropathy, without long-term current use of insulin ([place])   [facility] Birdie Sons, MD   6 months ago Type 2 diabetes mellitus with diabetic nephropathy, without long-term current use of insulin [facility])   [facility] Birdie Sons, MD   8 months ago Insomnia, unspecified type   [facility] Birdie Sons, MD   11 months ago Type 2 diabetes mellitus with diabetic nephropathy, without long-term current use of insulin [facility])   [facility] Birdie Sons, MD       Future Appointments             In 2 weeks [name], [name], MD [name]

## 2020-12-03 ENCOUNTER — Other Ambulatory Visit: Payer: Self-pay | Admitting: Physician Assistant

## 2020-12-07 NOTE — Progress Notes (Signed)
Lawrence [name]  [phone] [phone]  ID: Lawrence [name] OB: [birth year]  MR#: [id number]  EXN#:[id number]  Patient Care Team: Birdie Sons, MD as PCP - General (Family Medicine) Allyne Gee, MD as Consulting Physician (Pulmonary Disease) Marisa Hua, PA-C as Physician Assistant Anell Barr, OD as Consulting Physician (Optometry) Ubaldo Glassing Javier Docker, MD as Consulting Physician (Cardiology) Lloyd Huger, MD as Consulting Physician (Oncology)  CHIEF COMPLAINT: Polycythemia.  INTERVAL HISTORY: Patient returns to clinic today for repeat laboratory work, discussion of his imaging results, and consideration of phlebotomy.  He continues to feel well and remains asymptomatic. He has no neurologic complaints.  He denies any recent fevers or illnesses.  He has a good appetite and denies weight loss.  He has no chest pain, shortness of breath, cough, or hemoptysis.  He denies any nausea, vomiting, constipation, or diarrhea.  He has no urinary complaints.  Patient feels at his baseline offers no specific complaints today.  REVIEW OF SYSTEMS:   Review of Systems  Constitutional: Negative.  Negative for fever, malaise/fatigue and weight loss.  Respiratory: Negative.  Negative for cough, hemoptysis and shortness of breath.   Cardiovascular: Negative.  Negative for chest pain and leg swelling.  Gastrointestinal: Negative.  Negative for abdominal pain.  Genitourinary: Negative.  Negative for dysuria.  Musculoskeletal: Negative.  Negative for back pain.  Skin: Negative.  Negative for rash.  Neurological: Negative.  Negative for dizziness, focal weakness, weakness and headaches.  Psychiatric/Behavioral: Negative.  The patient is not nervous/anxious.     As per HPI. Otherwise, a complete review of systems is negative.  PAST MEDICAL HISTORY: Past Medical History:  Diagnosis Date  . A-fib ([name])   . Anemia   . Anginal pain ([place])   . Anxiety    . Arthritis   . Asthma   . CAD (coronary artery disease)    a. 2002 CABGx2 (LIMA->LAD, VG->VG->OM1);  b. 09/2012 DES->OM;  c. 03/2015 PTCA of LAD [facility]) in setting of atretic LIMA; d. 05/2015 Cath [facility]): nonobs dzs; e. 06/2015 Cath ([facility]): LM nl, LAD 45p/d ISR, 50d, D1/2 small, LCX 50p/d ISR, OM1 70ost, 30 ISR, VG->OM1 50ost, 22m LIMA->LAD 99p/d - atretic, RCA dom, nl; f.cath 10/16: 40-50%(FFR 0.90) pLAD, 75% (FFR 0.77) mLAD s/p PCI/DES, oRCA 40% (FFR0.95)  . Cancer ([place]    SKIN CANCER ON BACK  . Celiac disease   . Chronic diastolic CHF (congestive heart failure) ([place]    a. 06/2009 Echo: EF 60-65%, Gr 1 DD, triv AI, mildly dil LA, nl RV.  .Marland KitchenCOPD (chronic obstructive pulmonary disease) ([place]    a. Chronic bronchitis and emphysema.  . DDD (degenerative disc disease), lumbar   . Diverticulosis   . Dysrhythmia   . Essential hypertension   . GERD (gastroesophageal reflux disease)   . History of hiatal hernia   . History of kidney stones    H/O  . History of tobacco abuse    a. Quit 2014.  .Marland KitchenMyocardial infarction ([place] 2002   4 STENTS  . Pancreatitis   . PSVT (paroxysmal supraventricular tachycardia) ([place]    a. 10/2012 Noted on Zio Patch.  . Sleep apnea    LOST CORD TO CPAP -ONLY 02 @ BEDTIME  . Tubular adenoma of colon   . Type II diabetes mellitus ([place]     PAST SURGICAL HISTORY: Past Surgical History:  Procedure Laterality Date  . BYPASS GRAFT    . CARDIAC CATHETERIZATION N/A 07/12/2015  rocedure: Left Heart Cath and Cors/Grafts Angiography;  Surgeon: Belva Crome, MD;  Location: [place] CV LAB;  Service: Cardiovascular;  Laterality: N/A;  . CARDIAC CATHETERIZATION Right 10/07/2015   Procedure: Left Heart Cath and Cors/Grafts Angiography;  Surgeon: Dionisio David, MD;  Location: [place] CV LAB;  Service: Cardiovascular;  Laterality: Right;  . CARDIAC CATHETERIZATION N/A 04/06/2016   Procedure: Left Heart Cath and Coronary Angiography;  Surgeon: Yolonda Kida, MD;   Location: [place] CV LAB;  Service: Cardiovascular;  Laterality: N/A;  . CARDIAC CATHETERIZATION  04/06/2016   Procedure: Bypass Graft Angiography;  Surgeon: Yolonda Kida, MD;  Location: [place] CV LAB;  Service: Cardiovascular;;  . CARDIAC CATHETERIZATION N/A 11/02/2016   Procedure: Left Heart Cath and Cors/Grafts Angiography and possible PCI;  Surgeon: Yolonda Kida, MD;  Location: [place] CV LAB;  Service: Cardiovascular;  Laterality: N/A;  . CARDIAC CATHETERIZATION N/A 11/02/2016   Procedure: Coronary Stent Intervention;  Surgeon: Yolonda Kida, MD;  Location: [place] CV LAB;  Service: Cardiovascular;  Laterality: N/A;  . CHOLECYSTECTOMY    . CIRCUMCISION N/A 06/09/2019   Procedure: CIRCUMCISION ADULT;  Surgeon: Billey Co, MD;  Location: [facility] ORS;  Service: Urology;  Laterality: N/A;  . COLONOSCOPY WITH PROPOFOL N/A 04/01/2018   Procedure: COLONOSCOPY WITH PROPOFOL;  Surgeon: Manya Silvas, MD;  Location: [facility] ENDOSCOPY;  Service: Endoscopy;  Laterality: N/A;  . ESOPHAGEAL DILATION    . ESOPHAGOGASTRODUODENOSCOPY (EGD) WITH PROPOFOL N/A 04/01/2018   Procedure: ESOPHAGOGASTRODUODENOSCOPY (EGD) WITH PROPOFOL;  Surgeon: Manya Silvas, MD;  Location: [facility] ENDOSCOPY;  Service: Endoscopy;  Laterality: N/A;  . LEFT HEART CATH AND CORS/GRAFTS ANGIOGRAPHY N/A 06/12/2019   Procedure: LEFT HEART CATH AND CORS/GRAFTS ANGIOGRAPHY;  Surgeon: Teodoro Spray, MD;  Location: [place] CV LAB;  Service: Cardiovascular;  Laterality: N/A;  . LEFT HEART CATH AND CORS/GRAFTS ANGIOGRAPHY N/A 03/11/2020   Procedure: LEFT HEART CATH AND CORS/GRAFTS ANGIOGRAPHY;  Surgeon: Isaias Cowman, MD;  Location: [place] CV LAB;  Service: Cardiovascular;  Laterality: N/A;  . TONSILLECTOMY    . VASCULAR SURGERY      FAMILY HISTORY: Family History  Problem Relation Age of Onset  . Heart attack Mother   . Depression Mother   . Heart disease Mother   . COPD  Mother   . Hypertension Mother   . Heart attack Father   . Diabetes Father   . Depression Father   . Heart disease Father   . Cirrhosis Father   . Parkinson's disease Brother     ADVANCED DIRECTIVES (Y/N):  N  HEALTH MAINTENANCE: Social History   Tobacco Use  . Smoking status: Former Smoker    Packs/day: 3.00    Years: 50.00    Pack years: 150.00    Types: Cigarettes    Quit date: 04/22/2013    Years since quitting: 7.6  . Smokeless tobacco: Never Used  Vaping Use  . Vaping Use: Never used  Substance Use Topics  . Alcohol use: No    Comment: remotely quit alcohol use. Hx of heavy alcohol use.  . Drug use: Yes    Types: Marijuana    Comment: occasionally     Colonoscopy:  PAP:  Bone density:  Lipid panel:  Allergies  Allergen Reactions  . Demerol  [Meperidine Hcl]   . Demerol [Meperidine] Hives  . Jardiance [Empagliflozin] Other (See Comments)    Perineal pain  . Prednisone Other (See Comments) and Hypertension  Pt states that this medication puts him in A-fib   . Sulfa Antibiotics Hives  . Albuterol Sulfate [Albuterol] Palpitations and Other (See Comments)    Pt currently uses this medication.    . Morphine Sulfate Nausea And Vomiting, Rash and Other (See Comments)    Pt states that he is only allergic to the tablet form of this medication.      No current facility-administered medications for this visit.   Current Outpatient Medications  Medication Sig Dispense Refill  . albuterol (VENTOLIN HFA) 108 (90 Base) MCG/ACT inhaler INHALE 2 PUFFS BY MOUTH EVERY 6 HOURS AS NEEDED FOR SHORTNESS OF BREATH (Patient taking differently: Inhale 2 puffs into the lungs every 6 (six) hours as needed for wheezing or shortness of breath.) 18 g 5  . allopurinol (ZYLOPRIM) 300 MG tablet TAKE 1 TABLET BY MOUTH TWICE A DAY (Patient taking differently: Take 300 mg by mouth 2 (two) times daily.) 180 tablet 0  . ALPRAZolam (XANAX) 1 MG tablet TAKE ONE TABLET BY MOUTH THREE TIMES A  DAY 90 tablet 3  . amLODipine (NORVASC) 2.5 MG tablet Take 1 tablet (2.5 mg total) by mouth daily. 90 tablet 3  . aspirin 81 MG chewable tablet Chew 81 mg by mouth daily.    Marland Kitchen atorvastatin (LIPITOR) 80 MG tablet TAKE ONE TABLET BY MOUTH AT BEDTIME (Patient taking differently: Take 80 mg by mouth at bedtime.) 90 tablet 1  . BREO ELLIPTA 100-25 MCG/INH AEPB INHALE 1 PUFF INTO THE LUNGS DAILY 60 each 2  . budesonide (PULMICORT) 0.5 MG/2ML nebulizer solution Take 0.5 mg by nebulization 2 (two) times daily as needed (shortness of breath or wheezing).     Marland Kitchen docusate sodium (COLACE) 100 MG capsule Take 100 mg by mouth daily.     Marland Kitchen ELIQUIS 5 MG TABS tablet TAKE 1 TABLET BY MOUTH TWICE A DAY 180 tablet 3  . FARXIGA 10 MG TABS tablet TAKE 1 TABLET BY MOUTH DAILY (Patient taking differently: Take 10 mg by mouth daily.) 30 tablet 12  . isosorbide mononitrate (IMDUR) 120 MG 24 hr tablet TAKE 1 TABLET BY MOUTH DAILY 90 tablet 0  . LINZESS 72 MCG capsule Take 72 mcg by mouth daily before breakfast.     . lisinopril (ZESTRIL) 10 MG tablet TAKE 1 TABLET BY MOUTH DAILY (Patient taking differently: Take 10 mg by mouth daily.) 90 tablet 4  . magnesium oxide (MAG-OX) 400 (241.3 Mg) MG tablet Take 1 tablet (400 mg total) by mouth every morning. 30 tablet 5  . metaxalone (SKELAXIN) 800 MG tablet TAKE 1 TABLET BY MOUTH 3 TIMES A DAY AS NEEDED FOR PAIN 30 tablet 3  . metFORMIN (GLUCOPHAGE-XR) 500 MG 24 hr tablet Take 500 mg by mouth 2 (two) times daily.    . methocarbamol (ROBAXIN) 500 MG tablet Take by mouth.    . naloxone (NARCAN) nasal spray 4 mg/0.1 mL 1 spray into nostril x1 and may repeat every 2-3 minutes until patient is responsive or EMS arrives 2 each 2  . nitroGLYCERIN (NITROSTAT) 0.4 MG SL tablet PLACE ONE TABLET UNDER THE TONGUE EVERY 5 MINUTES FOR CHEST PAIN. IF NO RELIEF PAST 3RD TAB GO TO EMERGENCY ROOM (Patient taking differently: Place 0.4 mg under the tongue every 5 (five) minutes as needed for chest  pain.) 30 tablet 3  . omega-3 acid ethyl esters (LOVAZA) 1 g capsule TAKE 4 CAPSULES BY MOUTH DAILY (Patient taking differently: Take 4 g by mouth daily.) 120 capsule 12  .  omeprazole (PRILOSEC) 40 MG capsule Take 40 mg by mouth daily.     Marland Kitchen oxyCODONE-acetaminophen (PERCOCET) 10-325 MG tablet TAKE 1 TABLET BY MOUTH EVERY 4 HOURS AS NEEDED FOR PAIN 150 tablet 0  . ranolazine (RANEXA) 1000 MG SR tablet TAKE 1 TABLET BY MOUTH TWICE A DAY (Patient taking differently: Take 1,000 mg by mouth 2 (two) times daily.) 60 tablet 5  . SPIRIVA HANDIHALER 18 MCG inhalation capsule PLACE 1 CAPSULE INTO INHALER AND INHALE BY MOUTH ONCE A DAY. 30 capsule 0  . torsemide (DEMADEX) 100 MG tablet Take 0.5 tablets (50 mg total) by mouth daily. 30 tablet 3  . traZODone (DESYREL) 150 MG tablet TAKE ONE TABLET BY MOUTH AT BEDTIME 30 tablet 1  . venlafaxine XR (EFFEXOR-XR) 75 MG 24 hr capsule TAKE 1 CAPSULE BY MOUTH DAILY WITH BREAKFAST (Patient taking differently: Take 75 mg by mouth daily with breakfast.) 90 capsule 4  . cetirizine (ZYRTEC) 10 MG tablet TAKE ONE TABLET BY MOUTH AT BEDTIME 90 tablet 1  . Cholecalciferol (VITAMIN D3) 125 MCG (5000 UT) CAPS Take 5,000 Units by mouth daily.  (Patient not taking: No sig reported)    . Lidocaine HCl 4 % CREA Apply to affected area two to three times evert day (Patient not taking: Reported on 12/10/2020) 80 mL 2   Facility-Administered Medications Ordered in Other Visits  Medication Dose Route Frequency Provider Last Rate Last Admin  . acetaminophen (TYLENOL) tablet 650 mg  650 mg Oral Q6H PRN Ivor Costa, MD      . dextromethorphan-[facility]FEN[facility] DM) 30-600 MG per 12 hr tablet 1 tablet  1 tablet Oral BID PRN Ivor Costa, MD      . fentaNYL (SUBLIMAZE) injection 25 mcg  25 mcg Intravenous Q3H PRN Ivor Costa, MD      . hydrALAZINE (APRESOLINE) injection 5 mg  5 mg Intravenous Q2H PRN Ivor Costa, MD      . insulin aspart (novoLOG) injection 0-5 Units  0-5 Units Subcutaneous QHS  Ivor Costa, MD      . insulin aspart (novoLOG) injection 0-9 Units  0-9 Units Subcutaneous TID WC Ivor Costa, MD      . ipratropium (ATROVENT HFA) inhaler 2 puff  2 puff Inhalation Q4H PRN Ivor Costa, MD      . ondansetron (ZOFRAN) tablet 4 mg  4 mg Oral Q6H PRN Ivor Costa, MD        OBJECTIVE: Vitals:   12/10/20 1132  BP: (!) 143/87  Pulse: 67  Resp: 16  Temp: (!) 96.8 F (36 C)     Body mass index is 33.67 kg/m.    ECOG FS:0 - Asymptomatic  General: Well-developed, well-nourished, no acute distress. Eyes: Pink conjunctiva, anicteric sclera. HEENT: Normocephalic, moist mucous membranes. Lungs: No audible wheezing or coughing. Heart: Regular rate and rhythm. Abdomen: Soft, nontender, no obvious distention. Musculoskeletal: No edema, cyanosis, or clubbing. Neuro: Alert, answering all questions appropriately. Cranial nerves grossly intact. Skin: No rashes or petechiae noted. Psych: Normal affect.  LAB RESULTS:  Lab Results  Component Value Date   NA 141 12/10/2020   K 3.7 12/10/2020   CL 103 12/10/2020   CO2 18 (L) 12/10/2020   GLUCOSE 180 (H) 12/10/2020   BUN 15 12/10/2020   CREATININE 0.81 12/10/2020   CALCIUM 9.3 12/10/2020   PROT 5.8 (L) 10/07/2020   ALBUMIN 3.3 (L) 10/07/2020   AST 15 10/07/2020   ALT 15 10/07/2020   ALKPHOS 53 10/07/2020   BILITOT 0.6 10/07/2020  GFRNONAA >60 12/10/2020   GFRAA 97 09/24/2020    Lab Results  Component Value Date   WBC 7.6 12/10/2020   NEUTROABS 5.6 12/10/2020   HGB 18.7 (H) 12/10/2020   HCT 53.8 (H) 12/10/2020   MCV 94.4 12/10/2020   PLT 182 12/10/2020     STUDIES: DG Chest 2 View  Result Date: 12/10/2020 CLINICAL DATA:  65 year old male with chest pain. EXAM: CHEST - 2 VIEW COMPARISON:  Chest radiograph dated 11/24/2020. FINDINGS: No focal consolidation, pleural effusion or pneumothorax. Mild cardiomegaly. Median sternotomy wires, CABG vascular clips, and coronary stent. No acute osseous pathology. IMPRESSION: No  active cardiopulmonary disease. Electronically Signed   By: Anner Crete M.D.   On: 12/10/2020 20:35   DG Chest 2 View  Result Date: 11/24/2020 CLINICAL DATA:  Chest pain/pressure. EXAM: CHEST - 2 VIEW COMPARISON:  Radiograph and CT 10/06/2020 FINDINGS: Post median sternotomy and CABG. The heart is normal in size. Unchanged mediastinal contours. Chronic hyperinflation with diaphragmatic flattening. No pulmonary edema, focal airspace disease, pleural fluid or pneumothorax. Thoracic spondylosis. No acute osseous abnormalities are seen. IMPRESSION: 1. No acute chest findings. 2. Post CABG. Chronic hyperinflation. Electronically Signed   By: Keith Rake M.D.   On: 11/24/2020 03:48   CT Head Wo Contrast  Result Date: 12/11/2020 CLINICAL DATA:  Syncope, head injury, chronic anticoagulation EXAM: CT HEAD WITHOUT CONTRAST TECHNIQUE: Contiguous axial images were obtained from the base of the skull through the vertex without intravenous contrast. COMPARISON:  None. FINDINGS: Brain: Normal anatomic configuration. No abnormal intra or extra-axial mass lesion or fluid collection. No abnormal mass effect or midline shift. No evidence of acute intracranial hemorrhage or infarct. Ventricular size is normal. Cerebellum unremarkable. Vascular: Unremarkable Skull: Intact Sinuses/Orbits: Paranasal sinuses are clear. Orbits are unremarkable. Other: Mastoid air cells and middle ear cavities are clear. IMPRESSION: No acute intracranial injury.  No calvarial fracture Electronically Signed   By: Fidela Salisbury MD   On: 12/11/2020 01:17   CT Abdomen Pelvis W Contrast  Result Date: 11/24/2020 CLINICAL DATA:  Flank pain with kidney stone suspected. Left upper quadrant pain in the setting of polycythemia EXAM: CT ABDOMEN AND PELVIS WITH CONTRAST TECHNIQUE: Multidetector CT imaging of the abdomen and pelvis was performed using the standard protocol following bolus administration of intravenous contrast. CONTRAST:  177m  OMNIPAQUE IOHEXOL 300 MG/ML  SOLN COMPARISON:  05/26/2018 FINDINGS: Lower chest:  No contributory findings. Hepatobiliary: Hepatic steatosis without focal abnormality.Cholecystectomy without bile duct dilatation. Pancreas: Unremarkable. Spleen: Unremarkable. Adrenals/Urinary Tract: Stable mild bilateral adrenal lobulation. No hydronephrosis or stone. 6 mm right interpolar renal lesion which is too small for accurate densitometry, size stable from 2019. Unremarkable bladder. Stomach/Bowel: No obstruction. No appendicitis. Outpouching at the gastric cardia. There is no history of fundoplication, most consistent with a small paraesophageal hernia. Mild distal colonic diverticulosis. Vascular/Lymphatic: No acute vascular abnormality. Atheromatous calcifications of the aorta and iliacs. No mass or adenopathy. Reproductive:No pathologic findings. Other: No ascites or pneumoperitoneum. Shallow, fatty left inguinal hernia. Musculoskeletal: No acute abnormalities. IMPRESSION: 1. No worrisome renal lesion. There is a 6 mm right renal nodule that is too small for characterization but size stable from 2019. 2. Hepatic steatosis and mild colonic diverticulosis. 3.  Aortic Atherosclerosis (ICD10-I70.0). 4. Small paraesophageal hiatal hernia. 5. Fatty left inguinal hernia. Electronically Signed   By: JMonte FantasiaM.D.   On: 11/24/2020 04:18    ASSESSMENT: Polycythemia.  PLAN:   1. Polycythemia: Unclear etiology.  Patient's hemoglobin continues to trend up  and is now 19.1.  Previously, all of his other laboratory work except for an inappropriately elevated erythropoietin level was either negative or within normal limits including JAK2 mutation.  CT scan of the abdomen pelvis did not reveal any distinct pathology or suspicious renal lesions.  Because his hemoglobin continues to trend up, have recommended phlebotomy.  Patient declined treatment today but did agree to return later this week to have 500 mL phlebotomy.  He  would then return to clinic in 3 weeks for laboratory work in 4 weeks for lab for further evaluation and continuation of phlebotomy if needed.    I spent a total of 30 minutes reviewing chart data, face-to-face evaluation with the patient, counseling and coordination of care as detailed above.  Patient expressed understanding and was in agreement with this plan. He also understands that He can call clinic at any time with any questions, concerns, or complaints.     Lloyd Huger, MD   12/11/2020 9:25 AM

## 2020-12-10 ENCOUNTER — Other Ambulatory Visit: Payer: Self-pay

## 2020-12-10 ENCOUNTER — Encounter: Payer: Self-pay | Admitting: Oncology

## 2020-12-10 ENCOUNTER — Emergency Department: Payer: Medicare HMO

## 2020-12-10 ENCOUNTER — Encounter: Payer: Self-pay | Admitting: Emergency Medicine

## 2020-12-10 ENCOUNTER — Ambulatory Visit: Payer: Medicare HMO | Admitting: Oncology

## 2020-12-10 ENCOUNTER — Inpatient Hospital Stay: Payer: Medicare HMO | Attending: Oncology | Admitting: Oncology

## 2020-12-10 ENCOUNTER — Other Ambulatory Visit: Payer: Self-pay | Admitting: Family Medicine

## 2020-12-10 ENCOUNTER — Inpatient Hospital Stay: Payer: Medicare HMO

## 2020-12-10 VITALS — BP 143/87 | HR 67 | Temp 96.8°F | Resp 16 | Wt 215.0 lb

## 2020-12-10 DIAGNOSIS — I700 Atherosclerosis of aorta: Secondary | ICD-10-CM | POA: Diagnosis not present

## 2020-12-10 DIAGNOSIS — R21 Rash and other nonspecific skin eruption: Secondary | ICD-10-CM | POA: Diagnosis not present

## 2020-12-10 DIAGNOSIS — I5022 Chronic systolic (congestive) heart failure: Secondary | ICD-10-CM | POA: Insufficient documentation

## 2020-12-10 DIAGNOSIS — J449 Chronic obstructive pulmonary disease, unspecified: Secondary | ICD-10-CM | POA: Diagnosis not present

## 2020-12-10 DIAGNOSIS — Z87891 Personal history of nicotine dependence: Secondary | ICD-10-CM | POA: Insufficient documentation

## 2020-12-10 DIAGNOSIS — Z20822 Contact with and (suspected) exposure to covid-19: Secondary | ICD-10-CM | POA: Insufficient documentation

## 2020-12-10 DIAGNOSIS — Z885 Allergy status to narcotic agent status: Secondary | ICD-10-CM | POA: Diagnosis not present

## 2020-12-10 DIAGNOSIS — R55 Syncope and collapse: Secondary | ICD-10-CM | POA: Diagnosis not present

## 2020-12-10 DIAGNOSIS — Z888 Allergy status to other drugs, medicaments and biological substances status: Secondary | ICD-10-CM | POA: Insufficient documentation

## 2020-12-10 DIAGNOSIS — Z836 Family history of other diseases of the respiratory system: Secondary | ICD-10-CM | POA: Insufficient documentation

## 2020-12-10 DIAGNOSIS — N182 Chronic kidney disease, stage 2 (mild): Secondary | ICD-10-CM | POA: Diagnosis not present

## 2020-12-10 DIAGNOSIS — K5730 Diverticulosis of large intestine without perforation or abscess without bleeding: Secondary | ICD-10-CM | POA: Diagnosis not present

## 2020-12-10 DIAGNOSIS — R079 Chest pain, unspecified: Secondary | ICD-10-CM | POA: Diagnosis not present

## 2020-12-10 DIAGNOSIS — I5032 Chronic diastolic (congestive) heart failure: Secondary | ICD-10-CM | POA: Insufficient documentation

## 2020-12-10 DIAGNOSIS — I200 Unstable angina: Secondary | ICD-10-CM | POA: Diagnosis not present

## 2020-12-10 DIAGNOSIS — Z7901 Long term (current) use of anticoagulants: Secondary | ICD-10-CM | POA: Diagnosis not present

## 2020-12-10 DIAGNOSIS — Z7982 Long term (current) use of aspirin: Secondary | ICD-10-CM | POA: Insufficient documentation

## 2020-12-10 DIAGNOSIS — Z79899 Other long term (current) drug therapy: Secondary | ICD-10-CM | POA: Insufficient documentation

## 2020-12-10 DIAGNOSIS — F419 Anxiety disorder, unspecified: Secondary | ICD-10-CM | POA: Insufficient documentation

## 2020-12-10 DIAGNOSIS — Z8249 Family history of ischemic heart disease and other diseases of the circulatory system: Secondary | ICD-10-CM | POA: Insufficient documentation

## 2020-12-10 DIAGNOSIS — I517 Cardiomegaly: Secondary | ICD-10-CM | POA: Diagnosis not present

## 2020-12-10 DIAGNOSIS — I2510 Atherosclerotic heart disease of native coronary artery without angina pectoris: Secondary | ICD-10-CM | POA: Insufficient documentation

## 2020-12-10 DIAGNOSIS — Z86010 Personal history of colonic polyps: Secondary | ICD-10-CM | POA: Diagnosis not present

## 2020-12-10 DIAGNOSIS — Z85828 Personal history of other malignant neoplasm of skin: Secondary | ICD-10-CM | POA: Diagnosis not present

## 2020-12-10 DIAGNOSIS — I4821 Permanent atrial fibrillation: Secondary | ICD-10-CM | POA: Diagnosis not present

## 2020-12-10 DIAGNOSIS — Z818 Family history of other mental and behavioral disorders: Secondary | ICD-10-CM | POA: Insufficient documentation

## 2020-12-10 DIAGNOSIS — Z951 Presence of aortocoronary bypass graft: Secondary | ICD-10-CM | POA: Insufficient documentation

## 2020-12-10 DIAGNOSIS — K4090 Unilateral inguinal hernia, without obstruction or gangrene, not specified as recurrent: Secondary | ICD-10-CM | POA: Diagnosis not present

## 2020-12-10 DIAGNOSIS — E872 Acidosis: Secondary | ICD-10-CM | POA: Diagnosis not present

## 2020-12-10 DIAGNOSIS — Z9049 Acquired absence of other specified parts of digestive tract: Secondary | ICD-10-CM | POA: Insufficient documentation

## 2020-12-10 DIAGNOSIS — K760 Fatty (change of) liver, not elsewhere classified: Secondary | ICD-10-CM | POA: Diagnosis not present

## 2020-12-10 DIAGNOSIS — E119 Type 2 diabetes mellitus without complications: Secondary | ICD-10-CM | POA: Insufficient documentation

## 2020-12-10 DIAGNOSIS — I252 Old myocardial infarction: Secondary | ICD-10-CM | POA: Insufficient documentation

## 2020-12-10 DIAGNOSIS — R0789 Other chest pain: Secondary | ICD-10-CM | POA: Diagnosis not present

## 2020-12-10 DIAGNOSIS — D751 Secondary polycythemia: Secondary | ICD-10-CM | POA: Diagnosis not present

## 2020-12-10 DIAGNOSIS — M47814 Spondylosis without myelopathy or radiculopathy, thoracic region: Secondary | ICD-10-CM | POA: Insufficient documentation

## 2020-12-10 DIAGNOSIS — I6782 Cerebral ischemia: Secondary | ICD-10-CM | POA: Diagnosis not present

## 2020-12-10 DIAGNOSIS — Z7984 Long term (current) use of oral hypoglycemic drugs: Secondary | ICD-10-CM | POA: Insufficient documentation

## 2020-12-10 DIAGNOSIS — I110 Hypertensive heart disease with heart failure: Secondary | ICD-10-CM | POA: Diagnosis not present

## 2020-12-10 DIAGNOSIS — Z882 Allergy status to sulfonamides status: Secondary | ICD-10-CM | POA: Insufficient documentation

## 2020-12-10 DIAGNOSIS — I499 Cardiac arrhythmia, unspecified: Secondary | ICD-10-CM | POA: Diagnosis not present

## 2020-12-10 DIAGNOSIS — R0602 Shortness of breath: Secondary | ICD-10-CM | POA: Diagnosis not present

## 2020-12-10 DIAGNOSIS — I130 Hypertensive heart and chronic kidney disease with heart failure and stage 1 through stage 4 chronic kidney disease, or unspecified chronic kidney disease: Secondary | ICD-10-CM | POA: Insufficient documentation

## 2020-12-10 DIAGNOSIS — E1122 Type 2 diabetes mellitus with diabetic chronic kidney disease: Secondary | ICD-10-CM | POA: Insufficient documentation

## 2020-12-10 DIAGNOSIS — Z87442 Personal history of urinary calculi: Secondary | ICD-10-CM | POA: Insufficient documentation

## 2020-12-10 DIAGNOSIS — S0990XA Unspecified injury of head, initial encounter: Secondary | ICD-10-CM | POA: Diagnosis not present

## 2020-12-10 DIAGNOSIS — Z833 Family history of diabetes mellitus: Secondary | ICD-10-CM | POA: Insufficient documentation

## 2020-12-10 DIAGNOSIS — Z8269 Family history of other diseases of the musculoskeletal system and connective tissue: Secondary | ICD-10-CM | POA: Insufficient documentation

## 2020-12-10 DIAGNOSIS — Z8379 Family history of other diseases of the digestive system: Secondary | ICD-10-CM | POA: Insufficient documentation

## 2020-12-10 LAB — BASIC METABOLIC PANEL
Anion gap: 20 — ABNORMAL HIGH (ref 5–15)
BUN: 15 mg/dL (ref 8–23)
CO2: 18 mmol/L — ABNORMAL LOW (ref 22–32)
Calcium: 9.3 mg/dL (ref 8.9–10.3)
Chloride: 103 mmol/L (ref 98–111)
Creatinine, Ser: 0.81 mg/dL (ref 0.61–1.24)
GFR, Estimated: >60 mL/min (ref >60)
Glucose, Bld: 180 mg/dL — ABNORMAL HIGH (ref 70–99)
Potassium: 3.7 mmol/L (ref 3.5–5.1)
Sodium: 141 mmol/L (ref 135–145)

## 2020-12-10 LAB — CBC WITH DIFFERENTIAL/PLATELET
Abs Immature Granulocytes: 0.05 10*3/uL (ref 0.00–0.07)
Basophils Absolute: 0.1 10*3/uL (ref 0.0–0.1)
Basophils Relative: 1 %
Eosinophils Absolute: 0.2 10*3/uL (ref 0.0–0.5)
Eosinophils Relative: 2 %
HCT: 58.8 % — ABNORMAL HIGH (ref 39.0–52.0)
Hemoglobin: 19.9 g/dL — ABNORMAL HIGH (ref 13.0–17.0)
Immature Granulocytes: 1 %
Lymphocytes Relative: 21 %
Lymphs Abs: 1.8 10*3/uL (ref 0.7–4.0)
MCH: 32 pg (ref 26.0–34.0)
MCHC: 33.8 g/dL (ref 30.0–36.0)
MCV: 94.5 fL (ref 80.0–100.0)
Monocytes Absolute: 0.6 10*3/uL (ref 0.1–1.0)
Monocytes Relative: 7 %
Neutro Abs: 5.6 10*3/uL (ref 1.7–7.7)
Neutrophils Relative %: 68 %
Platelets: 193 10*3/uL (ref 150–400)
RBC: 6.22 MIL/uL — ABNORMAL HIGH (ref 4.22–5.81)
RDW: 13.5 % (ref 11.5–15.5)
WBC: 8.3 10*3/uL (ref 4.0–10.5)
nRBC: 0 % (ref 0.0–0.2)

## 2020-12-10 LAB — CBC
HCT: 53.8 % — ABNORMAL HIGH (ref 39.0–52.0)
Hemoglobin: 18.7 g/dL — ABNORMAL HIGH (ref 13.0–17.0)
MCH: 32.8 pg (ref 26.0–34.0)
MCHC: 34.8 g/dL (ref 30.0–36.0)
MCV: 94.4 fL (ref 80.0–100.0)
Platelets: 182 K/uL (ref 150–400)
RBC: 5.7 MIL/uL (ref 4.22–5.81)
RDW: 13.1 % (ref 11.5–15.5)
WBC: 7.6 K/uL (ref 4.0–10.5)
nRBC: 0 % (ref 0.0–0.2)

## 2020-12-10 LAB — TROPONIN I (HIGH SENSITIVITY)
Troponin I (High Sensitivity): 13 ng/L (ref <18)
Troponin I (High Sensitivity): 13 ng/L (ref <18)

## 2020-12-10 MED ORDER — ONDANSETRON HCL 4 MG/2ML IJ SOLN
4.0000 mg | Freq: Once | INTRAMUSCULAR | Status: AC
  Administered 2020-12-10: 4 mg via INTRAVENOUS
  Filled 2020-12-10: qty 2

## 2020-12-10 NOTE — ED Triage Notes (Signed)
EMS brings pt in from home for c/o CP since 4pm; 379m ASA and 3 NTG at home without relief; also reports syncopal episode

## 2020-12-10 NOTE — ED Triage Notes (Signed)
Pt to ED via EMS from home c/o left chest pain non radiating, sharp, heavy like elephant sitting on chest.  Hx of MI in 2002 with double bypass and stent placements.  Pt also states was seen at cancer center today and told his hemoglobin is elevated.  Pt also states had syncopal episode around 1700 while standing in house after bringing dog back into house, states fell to the floor denies hitting head.  Takes eliquis.  Pt A&Ox4, chest rise even and unlabored, in NAD at this time.

## 2020-12-10 NOTE — Progress Notes (Unsigned)
Pt in for follow up and test results.  

## 2020-12-10 NOTE — Telephone Encounter (Signed)
Requested Prescriptions  Pending Prescriptions Disp Refills  . cetirizine (ZYRTEC) 10 MG tablet [Pharmacy Med Name: CETIRIZINE HCL 10 MG TAB] 90 tablet 1    Sig: TAKE ONE TABLET BY MOUTH AT BEDTIME     Ear, Nose, and Throat:  Antihistamines Passed - 12/10/2020  2:42 PM      Passed - Valid encounter within last 12 months    Recent Outpatient Visits          1 month ago Chest wall pain   [facility] Birdie Sons, MD   2 months ago Type 2 diabetes mellitus with diabetic nephropathy, without long-term current use of insulin ([place])   [facility] Birdie Sons, MD   7 months ago Type 2 diabetes mellitus with diabetic nephropathy, without long-term current use of insulin [facility])   [facility] Birdie Sons, MD   8 months ago Insomnia, unspecified type   [facility] Birdie Sons, MD   11 months ago Type 2 diabetes mellitus with diabetic nephropathy, without long-term current use of insulin [facility])   [facility] Birdie Sons, MD      Future Appointments            In 6 days [name], [name], MD [facility] [place]

## 2020-12-11 ENCOUNTER — Emergency Department: Payer: Medicare HMO

## 2020-12-11 ENCOUNTER — Observation Stay
Admission: EM | Admit: 2020-12-11 | Discharge: 2020-12-12 | Disposition: A | Discharge status: Home / Self Care | Payer: Medicare HMO | Attending: Internal Medicine | Admitting: Internal Medicine

## 2020-12-11 ENCOUNTER — Inpatient Hospital Stay: Payer: Medicare HMO

## 2020-12-11 DIAGNOSIS — I6782 Cerebral ischemia: Secondary | ICD-10-CM | POA: Diagnosis not present

## 2020-12-11 DIAGNOSIS — E1121 Type 2 diabetes mellitus with diabetic nephropathy: Secondary | ICD-10-CM | POA: Diagnosis present

## 2020-12-11 DIAGNOSIS — I10 Essential (primary) hypertension: Secondary | ICD-10-CM | POA: Diagnosis present

## 2020-12-11 DIAGNOSIS — I4891 Unspecified atrial fibrillation: Secondary | ICD-10-CM | POA: Diagnosis present

## 2020-12-11 DIAGNOSIS — R55 Syncope and collapse: Secondary | ICD-10-CM | POA: Diagnosis not present

## 2020-12-11 DIAGNOSIS — I5022 Chronic systolic (congestive) heart failure: Secondary | ICD-10-CM | POA: Diagnosis not present

## 2020-12-11 DIAGNOSIS — E872 Acidosis, unspecified: Secondary | ICD-10-CM

## 2020-12-11 DIAGNOSIS — I480 Paroxysmal atrial fibrillation: Secondary | ICD-10-CM | POA: Diagnosis not present

## 2020-12-11 DIAGNOSIS — F419 Anxiety disorder, unspecified: Secondary | ICD-10-CM | POA: Diagnosis not present

## 2020-12-11 DIAGNOSIS — F418 Other specified anxiety disorders: Secondary | ICD-10-CM | POA: Diagnosis present

## 2020-12-11 DIAGNOSIS — D1779 Benign lipomatous neoplasm of other sites: Secondary | ICD-10-CM | POA: Diagnosis not present

## 2020-12-11 DIAGNOSIS — K219 Gastro-esophageal reflux disease without esophagitis: Secondary | ICD-10-CM | POA: Diagnosis not present

## 2020-12-11 DIAGNOSIS — R0789 Other chest pain: Secondary | ICD-10-CM | POA: Diagnosis not present

## 2020-12-11 DIAGNOSIS — E782 Mixed hyperlipidemia: Secondary | ICD-10-CM | POA: Diagnosis present

## 2020-12-11 DIAGNOSIS — I2510 Atherosclerotic heart disease of native coronary artery without angina pectoris: Secondary | ICD-10-CM | POA: Diagnosis present

## 2020-12-11 DIAGNOSIS — D751 Secondary polycythemia: Secondary | ICD-10-CM | POA: Diagnosis present

## 2020-12-11 DIAGNOSIS — I5023 Acute on chronic systolic (congestive) heart failure: Secondary | ICD-10-CM | POA: Diagnosis present

## 2020-12-11 DIAGNOSIS — R079 Chest pain, unspecified: Secondary | ICD-10-CM | POA: Diagnosis not present

## 2020-12-11 DIAGNOSIS — E785 Hyperlipidemia, unspecified: Secondary | ICD-10-CM | POA: Diagnosis present

## 2020-12-11 DIAGNOSIS — I25118 Atherosclerotic heart disease of native coronary artery with other forms of angina pectoris: Secondary | ICD-10-CM | POA: Diagnosis not present

## 2020-12-11 DIAGNOSIS — J449 Chronic obstructive pulmonary disease, unspecified: Secondary | ICD-10-CM | POA: Diagnosis present

## 2020-12-11 DIAGNOSIS — S0990XA Unspecified injury of head, initial encounter: Secondary | ICD-10-CM | POA: Diagnosis not present

## 2020-12-11 DIAGNOSIS — I200 Unstable angina: Secondary | ICD-10-CM

## 2020-12-11 DIAGNOSIS — D170 Benign lipomatous neoplasm of skin and subcutaneous tissue of head, face and neck: Secondary | ICD-10-CM | POA: Diagnosis not present

## 2020-12-11 LAB — GLUCOSE, CAPILLARY: Glucose-Capillary: 138 mg/dL — ABNORMAL HIGH (ref 70–99)

## 2020-12-11 LAB — URINE DRUG SCREEN, QUALITATIVE (ARMC ONLY)
Amphetamines, Ur Screen: NOT DETECTED
Barbiturates, Ur Screen: NOT DETECTED
Benzodiazepine, Ur Scrn: NOT DETECTED
Cannabinoid 50 Ng, Ur ~~LOC~~: POSITIVE — AB
Cocaine Metabolite,Ur ~~LOC~~: NOT DETECTED
MDMA (Ecstasy)Ur Screen: NOT DETECTED
Methadone Scn, Ur: NOT DETECTED
Opiate, Ur Screen: NOT DETECTED
Phencyclidine (PCP) Ur S: NOT DETECTED
Tricyclic, Ur Screen: NOT DETECTED

## 2020-12-11 LAB — BLOOD GAS, VENOUS
Acid-Base Excess: 3.9 mmol/L — ABNORMAL HIGH (ref 0.0–2.0)
Bicarbonate: 32.2 mmol/L — ABNORMAL HIGH (ref 20.0–28.0)
O2 Saturation: 72.1 %
Patient temperature: 37
pCO2, Ven: 61 mmHg — ABNORMAL HIGH (ref 44.0–60.0)
pH, Ven: 7.33 (ref 7.250–7.430)
pO2, Ven: 41 mmHg (ref 32.0–45.0)

## 2020-12-11 LAB — SALICYLATE LEVEL: Salicylate Lvl: <7 mg/dL — ABNORMAL LOW (ref 7.0–30.0)

## 2020-12-11 LAB — BRAIN NATRIURETIC PEPTIDE: B Natriuretic Peptide: 52.8 pg/mL (ref 0.0–100.0)

## 2020-12-11 LAB — ERYTHROPOIETIN: Erythropoietin: 12 m[IU]/mL (ref 2.6–18.5)

## 2020-12-11 LAB — FIBRIN DERIVATIVES D-DIMER (ARMC ONLY): Fibrin derivatives D-dimer (ARMC): 243.11 ng/mL (FEU) (ref 0.00–499.00)

## 2020-12-11 LAB — TROPONIN I (HIGH SENSITIVITY): Troponin I (High Sensitivity): 11 ng/L (ref <18)

## 2020-12-11 LAB — LACTIC ACID, PLASMA: Lactic Acid, Venous: 1.1 mmol/L (ref 0.5–1.9)

## 2020-12-11 LAB — BETA-HYDROXYBUTYRIC ACID: Beta-Hydroxybutyric Acid: 0.16 mmol/L (ref 0.05–0.27)

## 2020-12-11 LAB — CBG MONITORING, ED
Glucose-Capillary: 132 mg/dL — ABNORMAL HIGH (ref 70–99)
Glucose-Capillary: 163 mg/dL — ABNORMAL HIGH (ref 70–99)

## 2020-12-11 LAB — SARS CORONAVIRUS 2 BY RT PCR (HOSPITAL ORDER, PERFORMED IN ~~LOC~~ HOSPITAL LAB): SARS Coronavirus 2: NEGATIVE

## 2020-12-11 MED ORDER — AMLODIPINE BESYLATE 5 MG PO TABS
2.5000 mg | ORAL_TABLET | Freq: Every day | ORAL | Status: DC
Start: 2020-12-11 — End: 2020-12-12
  Administered 2020-12-11: 2.5 mg via ORAL
  Filled 2020-12-11 (×2): qty 1

## 2020-12-11 MED ORDER — DOCUSATE SODIUM 100 MG PO CAPS: 100.0000 mg | ORAL_CAPSULE | Freq: Every day | ORAL | Status: DC | PRN

## 2020-12-11 MED ORDER — SODIUM CHLORIDE 0.9 % IV BOLUS
1000.0000 mL | Freq: Once | INTRAVENOUS | Status: AC
  Administered 2020-12-11: 1000 mL via INTRAVENOUS

## 2020-12-11 MED ORDER — APIXABAN 5 MG PO TABS: 5.0000 mg | ORAL_TABLET | Freq: Two times a day (BID) | ORAL | Status: DC

## 2020-12-11 MED ORDER — ALBUTEROL SULFATE HFA 108 (90 BASE) MCG/ACT IN AERS
2.0000 | INHALATION_SPRAY | Freq: Four times a day (QID) | RESPIRATORY_TRACT | Status: DC | PRN
  Filled 2020-12-11: qty 6.7

## 2020-12-11 MED ORDER — NITROGLYCERIN 0.4 MG SL SUBL: 0.4000 mg | SUBLINGUAL_TABLET | SUBLINGUAL | Status: DC | PRN

## 2020-12-11 MED ORDER — LORATADINE 10 MG PO TABS
10.0000 mg | ORAL_TABLET | Freq: Every day | ORAL | Status: DC
  Administered 2020-12-11 – 2020-12-12 (×2): 10 mg via ORAL
  Filled 2020-12-11 (×2): qty 1

## 2020-12-11 MED ORDER — ONDANSETRON HCL 4 MG PO TABS
4.0000 mg | ORAL_TABLET | Freq: Four times a day (QID) | ORAL | Status: DC | PRN
  Administered 2020-12-11: 4 mg via ORAL
  Filled 2020-12-11: qty 1

## 2020-12-11 MED ORDER — TIOTROPIUM BROMIDE MONOHYDRATE 18 MCG IN CAPS
1.0000 | ORAL_CAPSULE | Freq: Every day | RESPIRATORY_TRACT | Status: DC
  Filled 2020-12-11 (×2): qty 5

## 2020-12-11 MED ORDER — ASPIRIN 81 MG PO CHEW
81.0000 mg | CHEWABLE_TABLET | Freq: Every day | ORAL | Status: DC
  Administered 2020-12-11 – 2020-12-12 (×2): 81 mg via ORAL
  Filled 2020-12-11 (×2): qty 1

## 2020-12-11 MED ORDER — APIXABAN 5 MG PO TABS
5.0000 mg | ORAL_TABLET | Freq: Two times a day (BID) | ORAL | Status: DC
  Administered 2020-12-11 – 2020-12-12 (×3): 5 mg via ORAL
  Filled 2020-12-11: qty 2
  Filled 2020-12-11 (×2): qty 1

## 2020-12-11 MED ORDER — OXYCODONE-ACETAMINOPHEN 5-325 MG PO TABS
1.0000 | ORAL_TABLET | Freq: Once | ORAL | Status: AC
  Administered 2020-12-11: 1 via ORAL
  Filled 2020-12-11: qty 1

## 2020-12-11 MED ORDER — OXYCODONE-ACETAMINOPHEN 5-325 MG PO TABS
1.0000 | ORAL_TABLET | ORAL | Status: DC | PRN
  Administered 2020-12-11 – 2020-12-12 (×2): 1 via ORAL
  Filled 2020-12-11 (×2): qty 1

## 2020-12-11 MED ORDER — BUDESONIDE 0.5 MG/2ML IN SUSP: 0.5000 mg | Freq: Two times a day (BID) | RESPIRATORY_TRACT | Status: DC | PRN

## 2020-12-11 MED ORDER — ATORVASTATIN CALCIUM 80 MG PO TABS
80.0000 mg | ORAL_TABLET | Freq: Every day | ORAL | Status: DC
  Administered 2020-12-11: 80 mg via ORAL
  Filled 2020-12-11: qty 1

## 2020-12-11 MED ORDER — OMEGA-3-ACID ETHYL ESTERS 1 G PO CAPS
4.0000 g | ORAL_CAPSULE | Freq: Every day | ORAL | Status: DC
  Administered 2020-12-11 – 2020-12-12 (×2): 4 g via ORAL
  Filled 2020-12-11 (×2): qty 4

## 2020-12-11 MED ORDER — LISINOPRIL 10 MG PO TABS
10.0000 mg | ORAL_TABLET | Freq: Every day | ORAL | Status: DC
  Administered 2020-12-11: 10 mg via ORAL
  Filled 2020-12-11 (×2): qty 1

## 2020-12-11 MED ORDER — ALPRAZOLAM 0.5 MG PO TABS
1.0000 mg | ORAL_TABLET | Freq: Three times a day (TID) | ORAL | Status: DC | PRN
  Administered 2020-12-11: 1 mg via ORAL
  Filled 2020-12-11: qty 2

## 2020-12-11 MED ORDER — METAXALONE 800 MG PO TABS
400.0000 mg | ORAL_TABLET | Freq: Three times a day (TID) | ORAL | Status: DC
  Administered 2020-12-12 (×2): 400 mg via ORAL
  Filled 2020-12-11 (×7): qty 0.5

## 2020-12-11 MED ORDER — IPRATROPIUM BROMIDE HFA 17 MCG/ACT IN AERS
2.0000 | INHALATION_SPRAY | RESPIRATORY_TRACT | Status: DC | PRN
  Filled 2020-12-11: qty 12.9

## 2020-12-11 MED ORDER — SOTALOL HCL 80 MG PO TABS
80.0000 mg | ORAL_TABLET | Freq: Two times a day (BID) | ORAL | Status: DC
  Administered 2020-12-11 (×2): 80 mg via ORAL
  Filled 2020-12-11 (×4): qty 1

## 2020-12-11 MED ORDER — PANTOPRAZOLE SODIUM 40 MG PO TBEC
40.0000 mg | DELAYED_RELEASE_TABLET | Freq: Every day | ORAL | Status: DC
  Administered 2020-12-11 – 2020-12-12 (×2): 40 mg via ORAL
  Filled 2020-12-11 (×2): qty 1

## 2020-12-11 MED ORDER — ISOSORBIDE MONONITRATE ER 60 MG PO TB24: 120.0000 mg | ORAL_TABLET | Freq: Every day | ORAL | Status: DC

## 2020-12-11 MED ORDER — MAGNESIUM OXIDE 400 (241.3 MG) MG PO TABS
400.0000 mg | ORAL_TABLET | ORAL | Status: DC
  Administered 2020-12-12: 400 mg via ORAL
  Filled 2020-12-11: qty 1

## 2020-12-11 MED ORDER — VENLAFAXINE HCL ER 75 MG PO CP24
75.0000 mg | ORAL_CAPSULE | Freq: Every day | ORAL | Status: DC
  Administered 2020-12-12: 75 mg via ORAL
  Filled 2020-12-11 (×2): qty 1

## 2020-12-11 MED ORDER — HYDRALAZINE HCL 20 MG/ML IJ SOLN: 5.0000 mg | INTRAMUSCULAR | Status: DC | PRN

## 2020-12-11 MED ORDER — MORPHINE SULFATE (PF) 2 MG/ML IV SOLN
2.0000 mg | INTRAVENOUS | Status: DC | PRN
  Administered 2020-12-11 (×2): 2 mg via INTRAVENOUS
  Filled 2020-12-11 (×2): qty 1

## 2020-12-11 MED ORDER — TRAZODONE HCL 50 MG PO TABS
150.0000 mg | ORAL_TABLET | Freq: Every day | ORAL | Status: DC
  Administered 2020-12-11: 150 mg via ORAL
  Filled 2020-12-11: qty 1

## 2020-12-11 MED ORDER — RANOLAZINE ER 500 MG PO TB12: 1000.0000 mg | ORAL_TABLET | Freq: Two times a day (BID) | ORAL | Status: DC

## 2020-12-11 MED ORDER — OXYCODONE-ACETAMINOPHEN 10-325 MG PO TABS: 1.0000 | ORAL_TABLET | ORAL | Status: DC | PRN

## 2020-12-11 MED ORDER — INSULIN ASPART 100 UNIT/ML ~~LOC~~ SOLN: 0.0000 [IU] | Freq: Every day | SUBCUTANEOUS | Status: DC

## 2020-12-11 MED ORDER — ISOSORBIDE MONONITRATE ER 60 MG PO TB24
120.0000 mg | ORAL_TABLET | Freq: Every day | ORAL | Status: DC
Start: 2020-12-11 — End: 2020-12-12
  Administered 2020-12-11 – 2020-12-12 (×2): 120 mg via ORAL
  Filled 2020-12-11 (×2): qty 2

## 2020-12-11 MED ORDER — INSULIN ASPART 100 UNIT/ML ~~LOC~~ SOLN
0.0000 [IU] | Freq: Three times a day (TID) | SUBCUTANEOUS | Status: DC
  Administered 2020-12-11: 1 [IU] via SUBCUTANEOUS
  Administered 2020-12-11: 2 [IU] via SUBCUTANEOUS
  Filled 2020-12-11 (×2): qty 1

## 2020-12-11 MED ORDER — ACETAMINOPHEN 325 MG PO TABS: 650.0000 mg | ORAL_TABLET | Freq: Four times a day (QID) | ORAL | Status: DC | PRN

## 2020-12-11 MED ORDER — AMLODIPINE BESYLATE 5 MG PO TABS: 2.5000 mg | ORAL_TABLET | Freq: Every day | ORAL | Status: DC

## 2020-12-11 MED ORDER — RANOLAZINE ER 500 MG PO TB12
1000.0000 mg | ORAL_TABLET | Freq: Two times a day (BID) | ORAL | Status: DC
  Administered 2020-12-11 – 2020-12-12 (×3): 1000 mg via ORAL
  Filled 2020-12-11 (×5): qty 2

## 2020-12-11 MED ORDER — DM-GUAIFENESIN ER 30-600 MG PO TB12: 1.0000 | ORAL_TABLET | Freq: Two times a day (BID) | ORAL | Status: DC | PRN

## 2020-12-11 MED ORDER — OXYCODONE HCL 5 MG PO TABS
5.0000 mg | ORAL_TABLET | ORAL | Status: DC | PRN
  Administered 2020-12-11 – 2020-12-12 (×3): 5 mg via ORAL
  Filled 2020-12-11 (×3): qty 1

## 2020-12-11 MED ORDER — ATORVASTATIN CALCIUM 80 MG PO TABS: 80.0000 mg | ORAL_TABLET | Freq: Every day | ORAL | Status: DC

## 2020-12-11 MED ORDER — FLUTICASONE FUROATE-VILANTEROL 100-25 MCG/INH IN AEPB
1.0000 | INHALATION_SPRAY | Freq: Every day | RESPIRATORY_TRACT | Status: DC
  Filled 2020-12-11: qty 28

## 2020-12-11 MED ORDER — FENTANYL CITRATE (PF) 100 MCG/2ML IJ SOLN
25.0000 ug | INTRAMUSCULAR | Status: DC | PRN
Start: 2020-12-11 — End: 2020-12-11

## 2020-12-11 NOTE — ED Notes (Signed)
Pt back from MRI 

## 2020-12-11 NOTE — H&P (Addendum)
History and Physical    Lawrence [name] OQH:[id number] DOB: 11-21-1955 DOA: 12/11/2020  Referring MD/NP/PA:   PCP: Birdie Sons, MD   Patient coming from:  The patient is coming from home.  At baseline, pt is independent for most of ADL.        Chief Complaint: chest pain and syncope  HPI: Lawrence [name] is a 65 y.o. male with medical history significant of hypertension, hyperlipidemia, diabetes mellitus, COPD, asthma, GERD, anxiety, CAD, CABG/stent placement, atrial fibrillation on Eliquis, polycythemia (being worked up by Dr. Grayland Ormond of hematology currently), OSA, sCHF with EF 45-45%, skin cancer, who presents with chest pain and syncope.  Pt state that his chest pain started at about 4 PM yesterday, which is located in the substernal area, pressure-like, 9 out of 10 severity, constant, nonradiating.  Associated with mild shortness of breath.  Patient has chronic cough due to COPD, which has not changed.  No fever or chills. Pt states that he passed about at about 5:00 PM yesterday after he brought his dog back into the house. He states that he passed out for about 3 minutes. He had lightheadedness and dizziness before passing out.  No unilateral numbness or tingling in his extremities, no facial droop or slurred speech. Patient denies nausea, vomiting, diarrhea, abdominal pain, symptoms of UTI.  ED Course: pt was found to have negative D-dimer 243, negative Covid PCR, beta hydroxybutyric acid 0.16, anion gap 20, bicarbonate 18, troponin level 13, 13, UDS positive for cannabinoids, salicylate level less than 7, temperature normal, blood pressure 156/78, heart rate 59, RR 16, oxygen saturation 94% on room air.  Chest x-ray negative.  CT head negative.  Patient is admitted to progressive bed as inpatient by accepting MD.  Review of Systems:   General: no fevers, chills, no body weight gain, has poor appetite, has fatigue HEENT: no blurry vision, hearing changes or sore  throat Respiratory: has dyspnea, coughing, no wheezing CV: has chest pain, no palpitations GI: no nausea, vomiting, abdominal pain, diarrhea, constipation GU: no dysuria, burning on urination, increased urinary frequency, hematuria  Ext: has trace leg edema Neuro: no unilateral weakness, numbness, or tingling, no vision change or hearing loss. Has syncope and lightheadedness. Skin: no rash, no skin tear. MSK: No muscle spasm, no deformity, no limitation of range of movement in spin Heme: No easy bruising.  Travel history: No recent long distant travel.  Allergy:  Allergies  Allergen Reactions  . Demerol  [Meperidine Hcl]   . Demerol [Meperidine] Hives  . Jardiance [Empagliflozin] Other (See Comments)    Perineal pain  . Prednisone Other (See Comments) and Hypertension    Pt states that this medication puts him in A-fib   . Sulfa Antibiotics Hives  . Albuterol Sulfate [Albuterol] Palpitations and Other (See Comments)    Pt currently uses this medication.    . Morphine Sulfate Nausea And Vomiting, Rash and Other (See Comments)    Pt states that he is only allergic to the tablet form of this medication.      Past Medical History:  Diagnosis Date  . A-fib ([place])   . Anemia   . Anginal pain ([place])   . Anxiety   . Arthritis   . Asthma   . CAD (coronary artery disease)    a. 2002 CABGx2 (LIMA->LAD, VG->VG->OM1);  b. 09/2012 DES->OM;  c. 03/2015 PTCA of LAD [facility]) in setting of atretic LIMA; d. 05/2015 Cath [facility]): nonobs dzs; e. 06/2015 Cath ([facility]): LM  nl, LAD 45p/d ISR, 50d, D1/2 small, LCX 50p/d ISR, OM1 70ost, 30 ISR, VG->OM1 50ost, 82m LIMA->LAD 99p/d - atretic, RCA dom, nl; f.cath 10/16: 40-50%(FFR 0.90) pLAD, 75% (FFR 0.77) mLAD s/p PCI/DES, oRCA 40% (FFR0.95)  . Cancer ([facility]    SKIN CANCER ON BACK  . Celiac disease   . Chronic diastolic CHF (congestive heart failure) ([place]    a. 06/2009 Echo: EF 60-65%, Gr 1 DD, triv AI, mildly dil LA, nl RV.  .Marland KitchenCOPD (chronic obstructive pulmonary  disease) ([facility]    a. Chronic bronchitis and emphysema.  . DDD (degenerative disc disease), lumbar   . Diverticulosis   . Dysrhythmia   . Essential hypertension   . GERD (gastroesophageal reflux disease)   . History of hiatal hernia   . History of kidney stones    H/O  . History of tobacco abuse    a. Quit 2014.  .Marland KitchenMyocardial infarction ([place] 2002   4 STENTS  . Pancreatitis   . PSVT (paroxysmal supraventricular tachycardia) ([place]    a. 10/2012 Noted on Zio Patch.  . Sleep apnea    LOST CORD TO CPAP -ONLY 02 @ BEDTIME  . Tubular adenoma of colon   . Type II diabetes mellitus ([facility]     Past Surgical History:  Procedure Laterality Date  . BYPASS GRAFT    . CARDIAC CATHETERIZATION N/A 07/12/2015   rocedure: Left Heart Cath and Cors/Grafts Angiography;  Surgeon: HBelva Crome MD;  [facility];  Service: Cardiovascular;  Laterality: N/A;  . CARDIAC CATHETERIZATION Right 10/07/2015   Procedure: Left Heart Cath and Cors/Grafts Angiography;  Surgeon: SDionisio David MD;  [facility];  Service: Cardiovascular;  Laterality: Right;  . CARDIAC CATHETERIZATION N/A 04/06/2016   Procedure: Left Heart Cath and Coronary Angiography;  Surgeon: DYolonda Kida MD;  [place];  Service: Cardiovascular;  Laterality: N/A;  . CARDIAC CATHETERIZATION  04/06/2016   Procedure: Bypass Graft Angiography;  Surgeon: DYolonda Kida MD;  [facility];  Service: Cardiovascular;;  . CARDIAC CATHETERIZATION N/A 11/02/2016   Procedure: Left Heart Cath and Cors/Grafts Angiography and possible PCI;  Surgeon: DYolonda Kida MD;  [facility];  Service: Cardiovascular;  Laterality: N/A;  . CARDIAC CATHETERIZATION N/A 11/02/2016   Procedure: Coronary Stent Intervention;  Surgeon: DYolonda Kida MD;  [facility];  Service: Cardiovascular;  Laterality: N/A;  . CHOLECYSTECTOMY    . CIRCUMCISION N/A  06/09/2019   Procedure: CIRCUMCISION ADULT;  Surgeon: SBilley Co MD;  Location: [facility] ORS;  Service: Urology;  Laterality: N/A;  . COLONOSCOPY WITH PROPOFOL N/A 04/01/2018   Procedure: COLONOSCOPY WITH PROPOFOL;  Surgeon: EManya Silvas MD;  [facility];  Service: Endoscopy;  Laterality: N/A;  . ESOPHAGEAL DILATION    . ESOPHAGOGASTRODUODENOSCOPY (EGD) WITH PROPOFOL N/A 04/01/2018   Procedure: ESOPHAGOGASTRODUODENOSCOPY (EGD) WITH PROPOFOL;  Surgeon: EManya Silvas MD;  [facility];  Service: Endoscopy;  Laterality: N/A;  . LEFT HEART CATH AND CORS/GRAFTS ANGIOGRAPHY N/A 06/12/2019   Procedure: LEFT HEART CATH AND CORS/GRAFTS ANGIOGRAPHY;  Surgeon: FTeodoro Spray MD;  [place];  Service: Cardiovascular;  Laterality: N/A;  . LEFT HEART CATH AND CORS/GRAFTS ANGIOGRAPHY N/A 03/11/2020   Procedure: LEFT HEART CATH AND CORS/GRAFTS ANGIOGRAPHY;  Surgeon: PIsaias Cowman MD;  [place];  Service: Cardiovascular;  Laterality: N/A;  . TONSILLECTOMY    .  VASCULAR SURGERY      Social History:  reports that he quit smoking about 7 years ago. His smoking use included cigarettes. He has a 150.00 pack-year smoking history. He has never used smokeless tobacco. He reports current drug use. Drug: Marijuana. He reports that he does not drink alcohol.  Family History:  Family History  Problem Relation Age of Onset  . Heart attack Mother   . Depression Mother   . Heart disease Mother   . COPD Mother   . Hypertension Mother   . Heart attack Father   . Diabetes Father   . Depression Father   . Heart disease Father   . Cirrhosis Father   . Parkinson's disease Brother      Prior to Admission medications   Medication Sig Start Date End Date Taking? Authorizing Provider  albuterol (VENTOLIN HFA) 108 (90 Base) MCG/ACT inhaler INHALE 2 PUFFS BY MOUTH EVERY 6 HOURS AS NEEDED FOR SHORTNESS OF BREATH Patient taking differently:  Inhale 2 puffs into the lungs every 6 (six) hours as needed for wheezing or shortness of breath. 05/13/20   Birdie Sons, MD  allopurinol (ZYLOPRIM) 300 MG tablet TAKE 1 TABLET BY MOUTH TWICE A DAY Patient taking differently: Take 300 mg by mouth 2 (two) times daily. 08/20/20   Birdie Sons, MD  ALPRAZolam Duanne Moron) 1 MG tablet TAKE ONE TABLET BY MOUTH THREE TIMES A DAY 09/19/20   Birdie Sons, MD  amLODipine (NORVASC) 2.5 MG tablet Take 1 tablet (2.5 mg total) by mouth daily. 09/24/20   Birdie Sons, MD  aspirin 81 MG chewable tablet Chew 81 mg by mouth daily.    [provider]  atorvastatin (LIPITOR) 80 MG tablet TAKE ONE TABLET BY MOUTH AT BEDTIME Patient taking differently: Take 80 mg by mouth at bedtime. 07/19/20   Birdie Sons, MD  BREO ELLIPTA 100-25 MCG/INH AEPB INHALE 1 PUFF INTO THE LUNGS DAILY 11/06/20   Birdie Sons, MD  budesonide (PULMICORT) 0.5 MG/2ML nebulizer solution Take 0.5 mg by nebulization 2 (two) times daily as needed (shortness of breath or wheezing).     [provider]  cetirizine (ZYRTEC) 10 MG tablet TAKE ONE TABLET BY MOUTH AT BEDTIME 12/10/20   Birdie Sons, MD  Cholecalciferol (VITAMIN D3) 125 MCG (5000 UT) CAPS Take 5,000 Units by mouth daily.  Patient not taking: No sig reported    [provider]  docusate sodium (COLACE) 100 MG capsule Take 100 mg by mouth daily.     [provider]  ELIQUIS 5 MG TABS tablet TAKE 1 TABLET BY MOUTH TWICE A DAY 05/01/20   [name], [name], MD  FARXIGA 10 MG TABS tablet TAKE 1 TABLET BY MOUTH DAILY Patient taking differently: Take 10 mg by mouth daily. 06/13/20   Birdie Sons, MD  isosorbide mononitrate (IMDUR) 120 MG 24 hr tablet TAKE 1 TABLET BY MOUTH DAILY 10/14/20   Birdie Sons, MD  Lidocaine HCl 4 % CREA Apply to affected area two to three times evert day Patient not taking: Reported on 12/10/2020 10/16/20   Birdie Sons, MD  LINZESS 72 MCG capsule Take 72 mcg  by mouth daily before breakfast.     [provider]  lisinopril (ZESTRIL) 10 MG tablet TAKE 1 TABLET BY MOUTH DAILY Patient taking differently: Take 10 mg by mouth daily. 08/19/20   Birdie Sons, MD  magnesium oxide (MAG-OX) 400 (241.3 Mg) MG tablet Take 1 tablet (400  mg total) by mouth every morning. 06/13/20   Birdie Sons, MD  metaxalone (SKELAXIN) 800 MG tablet TAKE 1 TABLET BY MOUTH 3 TIMES A DAY AS NEEDED FOR PAIN 11/27/20   Birdie Sons, MD  metFORMIN (GLUCOPHAGE-XR) 500 MG 24 hr tablet Take 500 mg by mouth 2 (two) times daily. 11/19/20   [provider]  methocarbamol (ROBAXIN) 500 MG tablet Take by mouth. 10/14/20   [provider]  naloxone [facility]) nasal spray 4 mg/0.1 mL 1 spray into nostril x1 and may repeat every 2-3 minutes until patient is responsive or EMS arrives 08/21/20   Birdie Sons, MD  nitroGLYCERIN (NITROSTAT) 0.4 MG SL tablet PLACE ONE TABLET UNDER THE TONGUE EVERY 5 MINUTES FOR CHEST PAIN. IF NO RELIEF PAST 3RD TAB GO TO EMERGENCY ROOM Patient taking differently: Place 0.4 mg under the tongue every 5 (five) minutes as needed for chest pain. 05/10/20   Birdie Sons, MD  omega-3 acid ethyl esters (LOVAZA) 1 g capsule TAKE 4 CAPSULES BY MOUTH DAILY Patient taking differently: Take 4 g by mouth daily. 08/12/20   Birdie Sons, MD  omeprazole (PRILOSEC) 40 MG capsule Take 40 mg by mouth daily.  03/19/20   [provider]  oxyCODONE-acetaminophen (PERCOCET) 10-325 MG tablet TAKE 1 TABLET BY MOUTH EVERY 4 HOURS AS NEEDED FOR PAIN 12/03/20   Birdie Sons, MD  ranolazine (RANEXA) 1000 MG SR tablet TAKE 1 TABLET BY MOUTH TWICE A DAY Patient taking differently: Take 1,000 mg by mouth 2 (two) times daily. 08/19/20   Birdie Sons, MD  SPIRIVA HANDIHALER 18 MCG inhalation capsule PLACE 1 CAPSULE INTO INHALER AND INHALE BY MOUTH ONCE A DAY. 11/06/20   Birdie Sons, MD  torsemide (DEMADEX) 100 MG tablet Take 0.5 tablets (50 mg  total) by mouth daily. 05/13/20   Birdie Sons, MD  traZODone (DESYREL) 150 MG tablet TAKE ONE TABLET BY MOUTH AT BEDTIME 11/25/20   Birdie Sons, MD  venlafaxine XR (EFFEXOR-XR) 75 MG 24 hr capsule TAKE 1 CAPSULE BY MOUTH DAILY WITH BREAKFAST Patient taking differently: Take 75 mg by mouth daily with breakfast. 08/19/20   Birdie Sons, MD    Physical Exam: Vitals:   12/11/20 1500 12/11/20 1530 12/11/20 1600 12/11/20 1730  BP: (!) 151/91 (!) 164/97 (!) 146/92 (!) 154/96  Pulse: 60 68 60 (!) 55  Resp: 15 19 16 18   Temp:      TempSrc:      SpO2: 95% 98% 93% 98%  Weight:      Height:       General: Not in acute distress HEENT:       Eyes: PERRL, EOMI, no scleral icterus.       ENT: No discharge from the ears and nose, no pharynx injection, no tonsillar enlargement.        Neck: No JVD, no bruit, no mass felt. Heme: No neck lymph node enlargement. Cardiac: S1/S2, RRR, No murmurs, No gallops or rubs. Respiratory: No rales, wheezing, rhonchi or rubs. GI: Soft, nondistended, nontender, no rebound pain, no organomegaly, BS present. GU: No hematuria Ext: has trace leg edema bilaterally. 1+DP/PT pulse bilaterally. Musculoskeletal: No joint deformities, No joint redness or warmth, no limitation of ROM in spin. Skin: No rashes.  Neuro: Alert, oriented X3, cranial nerves II-XII grossly intact, moves all extremities normally.  Psych: Patient is not psychotic, no suicidal or hemocidal ideation.  Labs on Admission: I have personally reviewed following labs and  imaging studies  CBC: Recent Labs  Lab 12/10/20 1035 12/10/20 2018  WBC 8.3 7.6  NEUTROABS 5.6  --   HGB 19.9* 18.7*  HCT 58.8* 53.8*  MCV 94.5 94.4  PLT 193 250   Basic Metabolic Panel: Recent Labs  Lab 12/10/20 2018  NA 141  K 3.7  CL 103  CO2 18*  GLUCOSE 180*  BUN 15  CREATININE 0.81  CALCIUM 9.3   GFR: Estimated Creatinine Clearance: 99.9 mL/min (by C-G formula based on SCr of 0.81 mg/dL). Liver  Function Tests: No results for input(s): AST, ALT, ALKPHOS, BILITOT, PROT, ALBUMIN in the last 168 hours. No results for input(s): LIPASE, AMYLASE in the last 168 hours. No results for input(s): AMMONIA in the last 168 hours. Coagulation Profile: No results for input(s): INR, PROTIME in the last 168 hours. Cardiac Enzymes: No results for input(s): CKTOTAL, CKMB, CKMBINDEX, TROPONINI in the last 168 hours. BNP (last 3 results) No results for input(s): PROBNP in the last 8760 hours. HbA1C: No results for input(s): HGBA1C in the last 72 hours. CBG: Recent Labs  Lab 12/11/20 1234 12/11/20 1657  GLUCAP 132* 163*   Lipid Profile: No results for input(s): CHOL, HDL, LDLCALC, TRIG, CHOLHDL, LDLDIRECT in the last 72 hours. Thyroid Function Tests: No results for input(s): TSH, T4TOTAL, FREET4, T3FREE, THYROIDAB in the last 72 hours. Anemia Panel: No results for input(s): VITAMINB12, FOLATE, FERRITIN, TIBC, IRON, RETICCTPCT in the last 72 hours. Urine analysis:    Component Value Date/Time   COLORURINE YELLOW (A) 11/24/2020 0432   APPEARANCEUR CLEAR (A) 11/24/2020 0432   APPEARANCEUR Clear 05/25/2019 1554   LABSPEC 1.031 (H) 11/24/2020 0432   LABSPEC 1.012 03/07/2015 2143   PHURINE 5.0 11/24/2020 0432   GLUCOSEU >=500 (A) 11/24/2020 0432   GLUCOSEU Negative 03/07/2015 2143   HGBUR NEGATIVE 11/24/2020 0432   BILIRUBINUR NEGATIVE 11/24/2020 0432   BILIRUBINUR Negative 05/25/2019 1554   BILIRUBINUR Negative 03/07/2015 2143   KETONESUR NEGATIVE 11/24/2020 0432   PROTEINUR NEGATIVE 11/24/2020 0432   UROBILINOGEN 0.2 03/26/2017 0907   NITRITE NEGATIVE 11/24/2020 0432   LEUKOCYTESUR NEGATIVE 11/24/2020 0432   LEUKOCYTESUR Negative 03/07/2015 2143   Sepsis Labs: @LABRCNTIP (procalcitonin:4,lacticidven:4) ) Recent Results (from the past 240 hour(s))  SARS Coronavirus 2 by RT PCR (hospital order, performed in [place] hospital lab) Nasopharyngeal Nasopharyngeal Swab     Status: None    Collection Time: 12/11/20  7:30 AM   Specimen: Nasopharyngeal Swab  Result Value Ref Range Status   SARS Coronavirus 2 NEGATIVE NEGATIVE Final    Comment: (NOTE) SARS-CoV-2 target nucleic acids are NOT DETECTED.  The SARS-CoV-2 RNA is generally detectable in upper and lower respiratory specimens during the acute phase of infection. The lowest concentration of SARS-CoV-2 viral copies this assay can detect is 250 copies / mL. A negative result does not preclude SARS-CoV-2 infection and should not be used as the sole basis for treatment or other patient management decisions.  A negative result may occur with improper specimen collection / handling, submission of specimen other than nasopharyngeal swab, presence of viral mutation(s) within the areas targeted by this assay, and inadequate number of viral copies (<250 copies / mL). A negative result must be combined with clinical observations, patient history, and epidemiological information.  Fact Sheet for Patients:   StrictlyIdeas.no  Fact Sheet for Healthcare Providers: BankingDealers.co.za  This test is not yet approved or  cleared by the [place] FDA and has been authorized for detection and/or diagnosis of SARS-CoV-2 by  FDA under an Emergency Use Authorization (EUA).  This EUA will remain in effect (meaning this test can be used) for the duration of the COVID-19 declaration under Section 564(b)(1) of the Act, 21 U.S.C. section 360bbb-3(b)(1), unless the authorization is terminated or revoked sooner.  Performed at [facility], [address] [place], [place] [address]      Radiological Exams on Admission: DG Chest 2 View  Result Date: 12/10/2020 CLINICAL DATA:  65 year old male with chest pain. EXAM: CHEST - 2 VIEW COMPARISON:  Chest radiograph dated 11/24/2020. FINDINGS: No focal consolidation, pleural effusion or pneumothorax. Mild cardiomegaly. Median  sternotomy wires, CABG vascular clips, and coronary stent. No acute osseous pathology. IMPRESSION: No active cardiopulmonary disease. Electronically Signed   By: Anner Crete M.D.   On: 12/10/2020 20:35   CT Head Wo Contrast  Result Date: 12/11/2020 CLINICAL DATA:  Syncope, head injury, chronic anticoagulation EXAM: CT HEAD WITHOUT CONTRAST TECHNIQUE: Contiguous axial images were obtained from the base of the skull through the vertex without intravenous contrast. COMPARISON:  None. FINDINGS: Brain: Normal anatomic configuration. No abnormal intra or extra-axial mass lesion or fluid collection. No abnormal mass effect or midline shift. No evidence of acute intracranial hemorrhage or infarct. Ventricular size is normal. Cerebellum unremarkable. Vascular: Unremarkable Skull: Intact Sinuses/Orbits: Paranasal sinuses are clear. Orbits are unremarkable. Other: Mastoid air cells and middle ear cavities are clear. IMPRESSION: No acute intracranial injury.  No calvarial fracture Electronically Signed   By: Fidela Salisbury MD   On: 12/11/2020 01:17   MR BRAIN WO CONTRAST  Result Date: 12/11/2020 CLINICAL DATA:  Syncope, simple, normal neuro exam EXAM: MRI HEAD WITHOUT CONTRAST TECHNIQUE: Multiplanar, multiecho pulse sequences of the brain and surrounding structures were obtained without intravenous contrast. COMPARISON:  12/11/2020 and prior. FINDINGS: Brain: No diffusion-weighted signal abnormality. Remote left occipital microhemorrhage. No midline shift, ventriculomegaly or extra-axial fluid collection. No mass lesion. Minimal chronic microvascular ischemic changes. Vascular: Normal flow voids. Skull and upper cervical spine: Normal marrow signal. Sinuses/Orbits: Normal orbits. Mild pansinus mucosal thickening. Small nasopharyngeal mucous retention cyst. No mastoid effusion. Other: Small right parietal scalp lipoma. IMPRESSION: No acute intracranial process. Remote left occipital microhemorrhage. Minimal chronic  microvascular ischemic changes. Electronically Signed   By: Primitivo Gauze M.D.   On: 12/11/2020 12:36     EKG: I have personally reviewed.  Sinus rhythm, QTC 435, LAE, mild ST depression in V4-V6  Assessment/Plan Principal Problem:   Chest pain Active Problems:   CAD (coronary artery disease)   HTN (hypertension)   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HCC)   GERD (gastroesophageal reflux disease)   HLD (hyperlipidemia)   COPD (chronic obstructive pulmonary disease) (HCC)   Chronic systolic CHF (congestive heart failure) (HCC)   Anxiety   Paroxysmal atrial fibrillation (HCC)   Polycythemia   Syncope   Acidosis, metabolic   Chest pain and hx of CAD: s/p of CABG and stent placement. Trop 13 -->13. Dr. Ubaldo Glassing of card is consulted. He dose not think pt's chest pain is due to ischemia. No further work-up needed.  -Patient is admitted to progressive bed as inpatient by accepting MD. - per Dr. Ubaldo Glassing, will continue Farxiga 10 mg daily, atorvastatin 80 mg daily, amlodipine 2.5 mg daily, apixaban 5 mg twice daily, Imdur 120 mg daily, lisinopril 5 mg daily, and Ranexa 500 mg twice daily as well as Betapace 80 mg twice daily.  Syncope: Etiology is not clear. The differential diagnosis is broad, including vasovagal syncope, TIA, arrhythmia, ACS,  drug abuse, orthostatic status. No seizure activity.  -Orthostatic vital signs  -MRI-brain - 2d echo - Neuro checks  - IVF: 2L NS  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[place]): Recent A1c 9.6, poorly controlled.  Patient is taking metformin and Farxiga at home -Sliding scale insulin  GERD (gastroesophageal reflux disease) -Protonix  HLD (hyperlipidemia) -Lipitor  Hypertension: -IV hydralazine as needed -Amlodipine, lisinopril, sotalol   COPD (chronic obstructive pulmonary disease) ([place]): stable -Bronchodilators  Chronic systolic CHF (congestive heart failure) ([place]): 2D echo on 09/28/2020 showed EF of 45 to 50%.  No leg  edema.  BNP 52.  No pulmonary edema by chest x-ray. CHF is compensated. -Hold torsemide  Anxiety -As needed Xanax  Paroxysmal atrial fibrillation (HCC) -Continue sotalol and Eliquis  Polycythemia: Patient is to follow-up by hematologist, Dr. Grayland Ormond. -Erythropoietin level, JAK2 were ordered by ED physician  Acidosis, metabolic: Anion gap 20, bicarbonate 18.  Etiology is not clear.  Salicylate level less than 7 -IV fluid as well -Check lactic acid level --> 1.1         DVT ppx: on Eliquis  Code Status: Full code Family Communication: not done, no family member is at bed side.  Disposition Plan:  Anticipate discharge back to previous environment Consults called:  Dr. Ubaldo Glassing of card Admission status and Level of care: progressive unit  as inpt         Status is: Inpatient  Remains inpatient appropriate because:Inpatient level of care appropriate due to severity of illness   Dispo: The patient is from: Home              Anticipated d/c is to: Home              Anticipated d/c date is: 2 days              Patient currently is not medically stable to d/c.   Difficult to place patient No          Date of Service 12/11/2020    [facility]   If 7PM-7AM, please contact night-coverage [email or web address] 12/11/2020, 6:00 PM

## 2020-12-11 NOTE — ED Notes (Signed)
CBG 132.

## 2020-12-11 NOTE — ED Provider Notes (Signed)
[facility] Emergency Department Provider Note   ____________________________________________   Event Date/Time   First MD Initiated Contact with Patient 12/11/20 0448     (approximate)  I have reviewed the triage vital signs and the nursing notes.   HISTORY  Chief Complaint Chest Pain    HPI Lawrence [name] is a 65 y.o. male brought to the ED via EMS from home with a chief complaint of chest pain and syncope.  Patient with a history of CAD status post CABG, CHF, COPD not on oxygen, atrial fibrillation on Eliquis, currently being worked up by hematology for polycythemia.  Reports pressure-like sensation on his chest last evening.  Denies associated diaphoresis, shortness of breath, nausea/vomiting or dizziness.  Patient did report syncopal episode after taking his dog out approximately 5 PM.  He felt lightheaded when he brought his dog back into the house and syncopized.  Does not think he struck his head.  Denies recent fever, chills, cough, abdominal pain, dysuria or diarrhea.  Patient is vaccinated plus boosted against COVID-19.     Past Medical History:  Diagnosis Date  . A-fib ([place])   . Anemia   . Anginal pain ([place])   . Anxiety   . Arthritis   . Asthma   . CAD (coronary artery disease)    a. 2002 CABGx2 (LIMA->LAD, VG->VG->OM1);  b. 09/2012 DES->OM;  c. 03/2015 PTCA of LAD [facility]) in setting of atretic LIMA; d. 05/2015 Cath [facility]): nonobs dzs; e. 06/2015 Cath ([facility]): LM nl, LAD 45p/d ISR, 50d, D1/2 small, LCX 50p/d ISR, OM1 70ost, 30 ISR, VG->OM1 50ost, 19m LIMA->LAD 99p/d - atretic, RCA dom, nl; f.cath 10/16: 40-50%(FFR 0.90) pLAD, 75% (FFR 0.77) mLAD s/p PCI/DES, oRCA 40% (FFR0.95)  . Cancer ([place]    SKIN CANCER ON BACK  . Celiac disease   . Chronic diastolic CHF (congestive heart failure) ([place]    a. 06/2009 Echo: EF 60-65%, Gr 1 DD, triv AI, mildly dil LA, nl RV.  .Marland KitchenCOPD (chronic obstructive pulmonary disease) ([place]    a. Chronic bronchitis and  emphysema.  . DDD (degenerative disc disease), lumbar   . Diverticulosis   . Dysrhythmia   . Essential hypertension   . GERD (gastroesophageal reflux disease)   . History of hiatal hernia   . History of kidney stones    H/O  . History of tobacco abuse    a. Quit 2014.  .Marland KitchenMyocardial infarction ([place] 2002   4 STENTS  . Pancreatitis   . PSVT (paroxysmal supraventricular tachycardia) ([place]    a. 10/2012 Noted on Zio Patch.  . Sleep apnea    LOST CORD TO CPAP -ONLY 02 @ BEDTIME  . Tubular adenoma of colon   . Type II diabetes mellitus ([facility]     Patient Active Problem List   Diagnosis Date Noted  . Syncope 12/11/2020  . Polycythemia 10/05/2020  . NSTEMI (non-ST elevated myocardial infarction) ([place] 09/26/2020  . Primary insomnia 05/13/2020  . Recurrent major depressive disorder, remission status unspecified ([place] 03/29/2020  . Elevated troponin 03/07/2020  . Bereavement 09/09/2019  . Chronic respiratory failure with hypoxia ([place] 05/29/2019  . Morbid obesity ([place] 05/29/2019  . Trigger thumb of right hand 11/28/2018  . Eye pain, left 08/18/2018  . Acute on chronic heart failure ([place] 04/18/2018  . History of adenomatous polyp of colon 04/05/2018  . Abdominal pain, chronic, epigastric 11/06/2017  . Bilateral flank pain 03/24/2017  . Dyspnea 04/03/2016  . Hypotension 04/03/2016  . CKD (  chronic kidney disease) stage 2, GFR 60-89 ml/min 04/03/2016  . Anemia 04/03/2016  . Paroxysmal atrial fibrillation ([place]) 12/23/2015  . OSA (obstructive sleep apnea) 12/10/2015  . Left inguinal hernia 11/07/2015  . Anxiety 11/07/2015  . Unstable angina ([place]) 10/05/2015  . Back pain with left-sided radiculopathy 09/30/2015  . Nocturnal hypoxia 09/06/2015  . BPH (benign prostatic hyperplasia) 08/01/2015  . Chronic diastolic CHF (congestive heart failure) ([place])   . Angina pectoris ([place])   . Chest pain 07/11/2015  . COPD (chronic obstructive pulmonary disease) ([place]) 07/03/2015  . CAD  (coronary artery disease) 06/26/2015  . HTN (hypertension) 06/26/2015  . 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[place]) 06/26/2015  . Achalasia 07/24/2014  . GERD (gastroesophageal reflux disease) 06/07/2014  . Former tobacco use 04/11/2013  . HLD (hyperlipidemia) 04/09/2013    Past Surgical History:  Procedure Laterality Date  . BYPASS GRAFT    . CARDIAC CATHETERIZATION N/A 07/12/2015   rocedure: Left Heart Cath and Cors/Grafts Angiography;  Surgeon: Belva Crome, MD;  Location: [place] CV LAB;  Service: Cardiovascular;  Laterality: N/A;  . CARDIAC CATHETERIZATION Right 10/07/2015   Procedure: Left Heart Cath and Cors/Grafts Angiography;  Surgeon: Dionisio David, MD;  Location: [place] [facility];  Service: Cardiovascular;  Laterality: Right;  . CARDIAC CATHETERIZATION N/A 04/06/2016   Procedure: Left Heart Cath and Coronary Angiography;  Surgeon: Yolonda Kida, MD;  Location: [place] [facility];  Service: Cardiovascular;  Laterality: N/A;  . CARDIAC CATHETERIZATION  04/06/2016   Procedure: Bypass Graft Angiography;  Surgeon: Yolonda Kida, MD;  Location: [place] [facility];  Service: Cardiovascular;;  . CARDIAC CATHETERIZATION N/A 11/02/2016   Procedure: Left Heart Cath and Cors/Grafts Angiography and possible PCI;  Surgeon: Yolonda Kida, MD;  Location: [place] [facility];  Service: Cardiovascular;  Laterality: N/A;  . CARDIAC CATHETERIZATION N/A 11/02/2016   Procedure: Coronary Stent Intervention;  Surgeon: Yolonda Kida, MD;  Location: [place] [facility];  Service: Cardiovascular;  Laterality: N/A;  . CHOLECYSTECTOMY    . CIRCUMCISION N/A 06/09/2019   Procedure: CIRCUMCISION ADULT;  Surgeon: Billey Co, MD;  Location: [facility] ORS;  Service: Urology;  Laterality: N/A;  . COLONOSCOPY WITH PROPOFOL N/A 04/01/2018   Procedure: COLONOSCOPY WITH PROPOFOL;  Surgeon: Manya Silvas, MD;  Location: [facility];  Service: Endoscopy;  Laterality: N/A;  .  ESOPHAGEAL DILATION    . ESOPHAGOGASTRODUODENOSCOPY (EGD) WITH PROPOFOL N/A 04/01/2018   Procedure: ESOPHAGOGASTRODUODENOSCOPY (EGD) WITH PROPOFOL;  Surgeon: Manya Silvas, MD;  [facility];  Service: Endoscopy;  Laterality: N/A;  . LEFT HEART CATH AND CORS/GRAFTS ANGIOGRAPHY N/A 06/12/2019   Procedure: LEFT HEART CATH AND CORS/GRAFTS ANGIOGRAPHY;  Surgeon: Teodoro Spray, MD;  Location: [place] CV LAB;  Service: Cardiovascular;  Laterality: N/A;  . LEFT HEART CATH AND CORS/GRAFTS ANGIOGRAPHY N/A 03/11/2020   Procedure: LEFT HEART CATH AND CORS/GRAFTS ANGIOGRAPHY;  Surgeon: Isaias Cowman, MD;  Location: [place] [facility];  Service: Cardiovascular;  Laterality: N/A;  . TONSILLECTOMY    . VASCULAR SURGERY      Prior to Admission medications   Medication Sig Start Date End Date Taking? Authorizing Provider  albuterol (VENTOLIN HFA) 108 (90 Base) MCG/ACT inhaler INHALE 2 PUFFS BY MOUTH EVERY 6 HOURS AS NEEDED FOR SHORTNESS OF BREATH Patient taking differently: Inhale 2 puffs into the lungs every 6 (six) hours as needed for wheezing or shortness of breath. 05/13/20   Birdie Sons, MD  allopurinol (ZYLOPRIM) 300  MG tablet TAKE 1 TABLET BY MOUTH TWICE A DAY Patient taking differently: Take 300 mg by mouth 2 (two) times daily. 08/20/20   Birdie Sons, MD  ALPRAZolam Duanne Moron) 1 MG tablet TAKE ONE TABLET BY MOUTH THREE TIMES A DAY 09/19/20   Birdie Sons, MD  amLODipine (NORVASC) 2.5 MG tablet Take 1 tablet (2.5 mg total) by mouth daily. 09/24/20   Birdie Sons, MD  aspirin 81 MG chewable tablet Chew 81 mg by mouth daily.    [provider]  atorvastatin (LIPITOR) 80 MG tablet TAKE ONE TABLET BY MOUTH AT BEDTIME Patient taking differently: Take 80 mg by mouth at bedtime. 07/19/20   Birdie Sons, MD  BREO ELLIPTA 100-25 MCG/INH AEPB INHALE 1 PUFF INTO THE LUNGS DAILY 11/06/20   Birdie Sons, MD  budesonide (PULMICORT) 0.5 MG/2ML nebulizer solution  Take 0.5 mg by nebulization 2 (two) times daily as needed (shortness of breath or wheezing).     [provider]  cetirizine (ZYRTEC) 10 MG tablet TAKE ONE TABLET BY MOUTH AT BEDTIME 12/10/20   Birdie Sons, MD  Cholecalciferol (VITAMIN D3) 125 MCG (5000 UT) CAPS Take 5,000 Units by mouth daily.  Patient not taking: No sig reported    [provider]  docusate sodium (COLACE) 100 MG capsule Take 100 mg by mouth daily.     [provider]  ELIQUIS 5 MG TABS tablet TAKE 1 TABLET BY MOUTH TWICE A DAY 05/01/20   [name], [name], MD  FARXIGA 10 MG TABS tablet TAKE 1 TABLET BY MOUTH DAILY Patient taking differently: Take 10 mg by mouth daily. 06/13/20   Birdie Sons, MD  isosorbide mononitrate (IMDUR) 120 MG 24 hr tablet TAKE 1 TABLET BY MOUTH DAILY 10/14/20   Birdie Sons, MD  Lidocaine HCl 4 % CREA Apply to affected area two to three times evert day Patient not taking: Reported on 12/10/2020 10/16/20   Birdie Sons, MD  LINZESS 72 MCG capsule Take 72 mcg by mouth daily before breakfast.     [provider]  lisinopril (ZESTRIL) 10 MG tablet TAKE 1 TABLET BY MOUTH DAILY Patient taking differently: Take 10 mg by mouth daily. 08/19/20   Birdie Sons, MD  magnesium oxide (MAG-OX) 400 (241.3 Mg) MG tablet Take 1 tablet (400 mg total) by mouth every morning. 06/13/20   Birdie Sons, MD  metaxalone (SKELAXIN) 800 MG tablet TAKE 1 TABLET BY MOUTH 3 TIMES A DAY AS NEEDED FOR PAIN 11/27/20   Birdie Sons, MD  metFORMIN (GLUCOPHAGE-XR) 500 MG 24 hr tablet Take 500 mg by mouth 2 (two) times daily. 11/19/20   [provider]  methocarbamol (ROBAXIN) 500 MG tablet Take by mouth. 10/14/20   [provider]  naloxone [facility]) nasal spray 4 mg/0.1 mL 1 spray into nostril x1 and may repeat every 2-3 minutes until patient is responsive or EMS arrives 08/21/20   Birdie Sons, MD  nitroGLYCERIN (NITROSTAT) 0.4 MG SL tablet PLACE ONE TABLET UNDER  THE TONGUE EVERY 5 MINUTES FOR CHEST PAIN. IF NO RELIEF PAST 3RD TAB GO TO EMERGENCY ROOM Patient taking differently: Place 0.4 mg under the tongue every 5 (five) minutes as needed for chest pain. 05/10/20   Birdie Sons, MD  omega-3 acid ethyl esters (LOVAZA) 1 g capsule TAKE 4 CAPSULES BY MOUTH DAILY Patient taking differently: Take 4 g by mouth daily. 08/12/20   Birdie Sons, MD  omeprazole ([place])  40 MG capsule Take 40 mg by mouth daily.  03/19/20   [provider]  oxyCODONE-acetaminophen (PERCOCET) 10-325 MG tablet TAKE 1 TABLET BY MOUTH EVERY 4 HOURS AS NEEDED FOR PAIN 12/03/20   Birdie Sons, MD  ranolazine (RANEXA) 1000 MG SR tablet TAKE 1 TABLET BY MOUTH TWICE A DAY Patient taking differently: Take 1,000 mg by mouth 2 (two) times daily. 08/19/20   Birdie Sons, MD  SPIRIVA HANDIHALER 18 MCG inhalation capsule PLACE 1 CAPSULE INTO INHALER AND INHALE BY MOUTH ONCE A DAY. 11/06/20   Birdie Sons, MD  torsemide (DEMADEX) 100 MG tablet Take 0.5 tablets (50 mg total) by mouth daily. 05/13/20   Birdie Sons, MD  traZODone (DESYREL) 150 MG tablet TAKE ONE TABLET BY MOUTH AT BEDTIME 11/25/20   Birdie Sons, MD  venlafaxine XR (EFFEXOR-XR) 75 MG 24 hr capsule TAKE 1 CAPSULE BY MOUTH DAILY WITH BREAKFAST Patient taking differently: Take 75 mg by mouth daily with breakfast. 08/19/20   Birdie Sons, MD    Allergies Demerol  [meperidine hcl], Demerol [meperidine], Jardiance [empagliflozin], Prednisone, Sulfa antibiotics, Albuterol sulfate [albuterol], and Morphine sulfate  Family History  Problem Relation Age of Onset  . Heart attack Mother   . Depression Mother   . Heart disease Mother   . COPD Mother   . Hypertension Mother   . Heart attack Father   . Diabetes Father   . Depression Father   . Heart disease Father   . Cirrhosis Father   . Parkinson's disease Brother     Social History Social History   Tobacco Use  . Smoking status: Former Smoker     Packs/day: 3.00    Years: 50.00    Pack years: 150.00    Types: Cigarettes    Quit date: 04/22/2013    Years since quitting: 7.6  . Smokeless tobacco: Never Used  Vaping Use  . Vaping Use: Never used  Substance Use Topics  . Alcohol use: No    Comment: remotely quit alcohol use. Hx of heavy alcohol use.  . Drug use: Yes    Types: Marijuana    Comment: occasionally    Review of Systems  Constitutional: No fever/chills Eyes: No visual changes. ENT: No sore throat. Cardiovascular: Positive for chest pain. Respiratory: Denies shortness of breath. Gastrointestinal: No abdominal pain.  No nausea, no vomiting.  No diarrhea.  No constipation. Genitourinary: Negative for dysuria. Musculoskeletal: Negative for back pain. Skin: Negative for rash. Neurological: Positive for syncope.  Negative for headaches, focal weakness or numbness.   ____________________________________________   PHYSICAL EXAM:  VITAL SIGNS: ED Triage Vitals  Enc Vitals Group     BP 12/10/20 2010 (!) 153/89     Pulse Rate 12/10/20 2010 (!) 56     Resp 12/10/20 2010 16     Temp 12/10/20 2010 98.3 F (36.8 C)     Temp Source 12/10/20 2010 Oral     SpO2 12/10/20 1953 95 %     Weight 12/10/20 2010 215 lb (97.5 kg)     Height 12/10/20 2010 5' 7"  (1.702 m)     Head Circumference --      Peak Flow --      Pain Score 12/10/20 2010 9     Pain Loc --      Pain Edu? --      Excl. in [place]? --     Constitutional: Alert and oriented. Ill appearing and in no acute distress. Eyes:  Conjunctivae are normal. PERRL. EOMI. Head: Atraumatic. Nose: Atraumatic. Mouth/Throat: Mucous membranes are mildly dry.  Neck: No stridor.  No cervical spine tenderness to palpation. Cardiovascular: Normal rate, regular rhythm. Grossly normal heart sounds.  Good peripheral circulation. Respiratory: Normal respiratory effort.  No retractions. Lungs CTAB. Gastrointestinal: Soft and nontender. No distention. No abdominal bruits. No CVA  tenderness. Musculoskeletal: No lower extremity tenderness nor edema.  No joint effusions. Neurologic: Alert and oriented x3.  CN II XII grossly intact.  Normal speech and language. No gross focal neurologic deficits are appreciated.  Skin:  Skin is warm, dry and intact. No rash noted. Psychiatric: Mood and affect are normal. Speech and behavior are normal.  ____________________________________________   LABS (all labs ordered are listed, but only abnormal results are displayed)  Labs Reviewed  BASIC METABOLIC PANEL - Abnormal; Notable for the following components:      Result Value   CO2 18 (*)    Glucose, Bld 180 (*)    Anion gap 20 (*)    All other components within normal limits  CBC - Abnormal; Notable for the following components:   Hemoglobin 18.7 (*)    HCT 53.8 (*)    All other components within normal limits  SARS CORONAVIRUS 2 BY RT PCR (HOSPITAL ORDER, [facility])  BLOOD GAS, VENOUS  BETA-HYDROXYBUTYRIC ACID  TROPONIN I (HIGH SENSITIVITY)  TROPONIN I (HIGH SENSITIVITY)   ____________________________________________  EKG  ED ECG REPORT I, Diamantina Edinger J, the attending physician, personally viewed and interpreted this ECG.   Date: 12/11/2020  EKG Time: 2007  Rate: 59  Rhythm: normal EKG, normal sinus rhythm  Axis: Normal  Intervals:none  ST&T Change: Nonspecific  ____________________________________________  RADIOLOGY I, Alli Jasmer J, personally viewed and evaluated these images (plain radiographs) as part of my medical decision making, as well as reviewing the written report by the radiologist.  ED MD interpretation: No ICH, no acute cardiopulmonary process  Official radiology report(s): DG Chest 2 View  Result Date: 12/10/2020 CLINICAL DATA:  65 year old male with chest pain. EXAM: CHEST - 2 VIEW COMPARISON:  Chest radiograph dated 11/24/2020. FINDINGS: No focal consolidation, pleural effusion or pneumothorax. Mild cardiomegaly.  Median sternotomy wires, CABG vascular clips, and coronary stent. No acute osseous pathology. IMPRESSION: No active cardiopulmonary disease. Electronically Signed   By: Anner Crete M.D.   On: 12/10/2020 20:35   CT Head Wo Contrast  Result Date: 12/11/2020 CLINICAL DATA:  Syncope, head injury, chronic anticoagulation EXAM: CT HEAD WITHOUT CONTRAST TECHNIQUE: Contiguous axial images were obtained from the base of the skull through the vertex without intravenous contrast. COMPARISON:  None. FINDINGS: Brain: Normal anatomic configuration. No abnormal intra or extra-axial mass lesion or fluid collection. No abnormal mass effect or midline shift. No evidence of acute intracranial hemorrhage or infarct. Ventricular size is normal. Cerebellum unremarkable. Vascular: Unremarkable Skull: Intact Sinuses/Orbits: Paranasal sinuses are clear. Orbits are unremarkable. Other: Mastoid air cells and middle ear cavities are clear. IMPRESSION: No acute intracranial injury.  No calvarial fracture Electronically Signed   By: Fidela Salisbury MD   On: 12/11/2020 01:17    ____________________________________________   PROCEDURES  Procedure(s) performed (including Critical Care):  Procedures   ____________________________________________   INITIAL IMPRESSION / ASSESSMENT AND PLAN / ED COURSE  As part of my medical decision making, I reviewed the following data within the [place] notes reviewed and incorporated, Labs reviewed, EKG interpreted, Old chart reviewed, Radiograph reviewed, Discussed with admitting physician and  Notes from prior ED visits     65 year old male presenting with chest pain and syncope. Differential diagnosis includes, but is not limited to, ACS, aortic dissection, pulmonary embolism, cardiac tamponade, pneumothorax, pneumonia, pericarditis, myocarditis, GI-related causes including esophagitis/gastritis, and musculoskeletal chest wall pain.    Laboratory results  remarkable for increased H/H, elevated anion gap.  2 sets of negative troponins.  Glucose only 180.  Will check VBG, beta hydroxybutyrate.  Initiate IV fluid resuscitation, check Covid.  Will discuss with hospital services for admission.      ____________________________________________   FINAL CLINICAL IMPRESSION(S) / ED DIAGNOSES  Final diagnoses:  Unstable angina (HCC)  Syncope, unspecified syncope type  Acidosis     ED Discharge Orders    None      *Please note:  Lawrence [name] was evaluated in Emergency Department on 12/11/2020 for the symptoms described in the history of present illness. He was evaluated in the context of the global COVID-19 pandemic, which necessitated consideration that the patient might be at risk for infection with the SARS-CoV-2 virus that causes COVID-19. Institutional protocols and algorithms that pertain to the evaluation of patients at risk for COVID-19 are in a state of rapid change based on information released by regulatory bodies including the CDC and federal and state organizations. These policies and algorithms were followed during the patient's care in the ED.  Some ED evaluations and interventions may be delayed as a result of limited staffing during and the pandemic.*   Note:  This document was prepared using Dragon voice recognition software and may include unintentional dictation errors.   Paulette Blanch, MD 12/11/20 [phone]

## 2020-12-11 NOTE — ED Notes (Signed)
Spoke with Dr Blaine Hamper about pt needing cardiology consult. Dr Ubaldo Glassing at bedside.

## 2020-12-11 NOTE — Consult Note (Signed)
Cardiology Consultation Note    Patient ID: Lawrence [name], MRN: [id number], DOB/AGE: [birth year] 65 y.o. Admit date: 12/11/2020   Date of Consult: 12/11/2020 Primary Physician: Birdie Sons, MD Primary Cardiologist: Dr. Ubaldo Glassing  Chief Complaint: chest pain Reason for Consultation: chest pain Requesting MD: Dr. Ubaldo Glassing  HPI: Lawrence [name] is a 65 y.o. male with history of  Chronic pain syndrome, coronary artery disease status post carotid bypass grafting, COPD, atrial fibrillation anticoagulated with Eliquis, history of polycythemia, who presented to emergency room with complaints of chest pain as well and was in the reported syncopal episode while walking his dog.  This does not appear to have been witnessed.  He stated he felt lightheaded after walking his dog and then had a syncopal episode per his report.  He has shortness of breath but no fever or chills.  EKG reveals sinus bradycardia at a rate of 59 with no ischemic changes.  His troponin is normal.  Urine drug screen positive for cannabinoids.  BNP was normal at 52.8.  Echocardiogram done 2 months ago showed EF 45 to 50% with mild anteroseptal and apical hypokinesis.  No significant valvular abnormalities.  Cardiac catheterization done in April 2021 showed widely patent stents with no change from cath done in 2020.  Medical management was recommended then.  Chest pain is somewhat atypical worsened with deep inspiration and deep palpation.  No arrhythmias on telemetry.  Patient stated he is compliant with medications. Past Medical History:  Diagnosis Date  . A-fib ([place])   . Anemia   . Anginal pain ([place])   . Anxiety   . Arthritis   . Asthma   . CAD (coronary artery disease)    a. 2002 CABGx2 (LIMA->LAD, VG->VG->OM1);  b. 09/2012 DES->OM;  c. 03/2015 PTCA of LAD [facility]) in setting of atretic LIMA; d. 05/2015 Cath [facility]): nonobs dzs; e. 06/2015 Cath ([facility]): LM nl, LAD 45p/d ISR, 50d, D1/2 small, LCX 50p/d ISR, OM1 70ost, 30 ISR, VG->OM1 50ost,  20m LIMA->LAD 99p/d - atretic, RCA dom, nl; f.cath 10/16: 40-50%(FFR 0.90) pLAD, 75% (FFR 0.77) mLAD s/p PCI/DES, oRCA 40% (FFR0.95)  . Cancer ([place]    SKIN CANCER ON BACK  . Celiac disease   . Chronic diastolic CHF (congestive heart failure) ([place]    a. 06/2009 Echo: EF 60-65%, Gr 1 DD, triv AI, mildly dil LA, nl RV.  .Lawrence KitchenCOPD (chronic obstructive pulmonary disease) ([place]    a. Chronic bronchitis and emphysema.  . DDD (degenerative disc disease), lumbar   . Diverticulosis   . Dysrhythmia   . Essential hypertension   . GERD (gastroesophageal reflux disease)   . History of hiatal hernia   . History of kidney stones    H/O  . History of tobacco abuse    a. Quit 2014.  .Lawrence KitchenMyocardial infarction ([place] 2002   4 STENTS  . Pancreatitis   . PSVT (paroxysmal supraventricular tachycardia) ([place]    a. 10/2012 Noted on Zio Patch.  . Sleep apnea    LOST CORD TO CPAP -ONLY 02 @ BEDTIME  . Tubular adenoma of colon   . Type II diabetes mellitus ([place]       Surgical History:  Past Surgical History:  Procedure Laterality Date  . BYPASS GRAFT    . CARDIAC CATHETERIZATION N/A 07/12/2015   rocedure: Left Heart Cath and Cors/Grafts Angiography;  Surgeon: HBelva Crome MD;  [facility] [place];  Service: Cardiovascular;  Laterality: N/A;  . CARDIAC CATHETERIZATION  Right 10/07/2015   Procedure: Left Heart Cath and Cors/Grafts Angiography;  Surgeon: Dionisio David, MD;  Location: [place] CV LAB;  Service: Cardiovascular;  Laterality: Right;  . CARDIAC CATHETERIZATION N/A 04/06/2016   Procedure: Left Heart Cath and Coronary Angiography;  Surgeon: Yolonda Kida, MD;  Location: [place] CV LAB;  Service: Cardiovascular;  Laterality: N/A;  . CARDIAC CATHETERIZATION  04/06/2016   Procedure: Bypass Graft Angiography;  Surgeon: Yolonda Kida, MD;  Location: [place] CV LAB;  Service: Cardiovascular;;  . CARDIAC CATHETERIZATION N/A 11/02/2016   Procedure: Left Heart Cath and  Cors/Grafts Angiography and possible PCI;  Surgeon: Yolonda Kida, MD;  Location: [place] CV LAB;  Service: Cardiovascular;  Laterality: N/A;  . CARDIAC CATHETERIZATION N/A 11/02/2016   Procedure: Coronary Stent Intervention;  Surgeon: Yolonda Kida, MD;  Location: [place] CV LAB;  Service: Cardiovascular;  Laterality: N/A;  . CHOLECYSTECTOMY    . CIRCUMCISION N/A 06/09/2019   Procedure: CIRCUMCISION ADULT;  Surgeon: Billey Co, MD;  Location: [facility] ORS;  Service: Urology;  Laterality: N/A;  . COLONOSCOPY WITH PROPOFOL N/A 04/01/2018   Procedure: COLONOSCOPY WITH PROPOFOL;  Surgeon: Manya Silvas, MD;  Location: [facility] ENDOSCOPY;  Service: Endoscopy;  Laterality: N/A;  . ESOPHAGEAL DILATION    . ESOPHAGOGASTRODUODENOSCOPY (EGD) WITH PROPOFOL N/A 04/01/2018   Procedure: ESOPHAGOGASTRODUODENOSCOPY (EGD) WITH PROPOFOL;  Surgeon: Manya Silvas, MD;  Location: [facility] ENDOSCOPY;  Service: Endoscopy;  Laterality: N/A;  . LEFT HEART CATH AND CORS/GRAFTS ANGIOGRAPHY N/A 06/12/2019   Procedure: LEFT HEART CATH AND CORS/GRAFTS ANGIOGRAPHY;  Surgeon: Teodoro Spray, MD;  Location: [place] CV LAB;  Service: Cardiovascular;  Laterality: N/A;  . LEFT HEART CATH AND CORS/GRAFTS ANGIOGRAPHY N/A 03/11/2020   Procedure: LEFT HEART CATH AND CORS/GRAFTS ANGIOGRAPHY;  Surgeon: Isaias Cowman, MD;  Location: [place] CV LAB;  Service: Cardiovascular;  Laterality: N/A;  . TONSILLECTOMY    . VASCULAR SURGERY       Home Meds: Prior to Admission medications   Medication Sig Start Date End Date Taking? Authorizing Provider  albuterol (VENTOLIN HFA) 108 (90 Base) MCG/ACT inhaler INHALE 2 PUFFS BY MOUTH EVERY 6 HOURS AS NEEDED FOR SHORTNESS OF BREATH Patient taking differently: Inhale 2 puffs into the lungs every 6 (six) hours as needed for wheezing or shortness of breath. 05/13/20   Birdie Sons, MD  allopurinol (ZYLOPRIM) 300 MG tablet TAKE 1 TABLET BY MOUTH TWICE A  DAY Patient taking differently: Take 300 mg by mouth 2 (two) times daily. 08/20/20   Birdie Sons, MD  ALPRAZolam Duanne Moron) 1 MG tablet TAKE ONE TABLET BY MOUTH THREE TIMES A DAY 09/19/20   Birdie Sons, MD  amLODipine (NORVASC) 2.5 MG tablet Take 1 tablet (2.5 mg total) by mouth daily. 09/24/20   Birdie Sons, MD  aspirin 81 MG chewable tablet Chew 81 mg by mouth daily.    [provider]  atorvastatin (LIPITOR) 80 MG tablet TAKE ONE TABLET BY MOUTH AT BEDTIME Patient taking differently: Take 80 mg by mouth at bedtime. 07/19/20   Birdie Sons, MD  BREO ELLIPTA 100-25 MCG/INH AEPB INHALE 1 PUFF INTO THE LUNGS DAILY 11/06/20   Birdie Sons, MD  budesonide (PULMICORT) 0.5 MG/2ML nebulizer solution Take 0.5 mg by nebulization 2 (two) times daily as needed (shortness of breath or wheezing).     [provider]  cetirizine (ZYRTEC) 10 MG tablet TAKE ONE TABLET BY MOUTH AT BEDTIME 12/10/20  Birdie Sons, MD  Cholecalciferol (VITAMIN D3) 125 MCG (5000 UT) CAPS Take 5,000 Units by mouth daily.  Patient not taking: No sig reported    [provider]  docusate sodium (COLACE) 100 MG capsule Take 100 mg by mouth daily.     [provider]  ELIQUIS 5 MG TABS tablet TAKE 1 TABLET BY MOUTH TWICE A DAY 05/01/20   [name], [name], MD  FARXIGA 10 MG TABS tablet TAKE 1 TABLET BY MOUTH DAILY Patient taking differently: Take 10 mg by mouth daily. 06/13/20   Birdie Sons, MD  isosorbide mononitrate (IMDUR) 120 MG 24 hr tablet TAKE 1 TABLET BY MOUTH DAILY 10/14/20   Birdie Sons, MD  Lidocaine HCl 4 % CREA Apply to affected area two to three times evert day Patient not taking: Reported on 12/10/2020 10/16/20   Birdie Sons, MD  LINZESS 72 MCG capsule Take 72 mcg by mouth daily before breakfast.     [provider]  lisinopril (ZESTRIL) 10 MG tablet TAKE 1 TABLET BY MOUTH DAILY Patient taking differently: Take 10 mg by mouth daily. 08/19/20    Birdie Sons, MD  magnesium oxide (MAG-OX) 400 (241.3 Mg) MG tablet Take 1 tablet (400 mg total) by mouth every morning. 06/13/20   Birdie Sons, MD  metaxalone (SKELAXIN) 800 MG tablet TAKE 1 TABLET BY MOUTH 3 TIMES A DAY AS NEEDED FOR PAIN 11/27/20   Birdie Sons, MD  metFORMIN (GLUCOPHAGE-XR) 500 MG 24 hr tablet Take 500 mg by mouth 2 (two) times daily. 11/19/20   [provider]  methocarbamol (ROBAXIN) 500 MG tablet Take by mouth. 10/14/20   [provider]  naloxone [facility]) nasal spray 4 mg/0.1 mL 1 spray into nostril x1 and may repeat every 2-3 minutes until patient is responsive or EMS arrives 08/21/20   Birdie Sons, MD  nitroGLYCERIN (NITROSTAT) 0.4 MG SL tablet PLACE ONE TABLET UNDER THE TONGUE EVERY 5 MINUTES FOR CHEST PAIN. IF NO RELIEF PAST 3RD TAB GO TO EMERGENCY ROOM Patient taking differently: Place 0.4 mg under the tongue every 5 (five) minutes as needed for chest pain. 05/10/20   Birdie Sons, MD  omega-3 acid ethyl esters (LOVAZA) 1 g capsule TAKE 4 CAPSULES BY MOUTH DAILY Patient taking differently: Take 4 g by mouth daily. 08/12/20   Birdie Sons, MD  omeprazole (PRILOSEC) 40 MG capsule Take 40 mg by mouth daily.  03/19/20   [provider]  oxyCODONE-acetaminophen (PERCOCET) 10-325 MG tablet TAKE 1 TABLET BY MOUTH EVERY 4 HOURS AS NEEDED FOR PAIN 12/03/20   Birdie Sons, MD  ranolazine (RANEXA) 1000 MG SR tablet TAKE 1 TABLET BY MOUTH TWICE A DAY Patient taking differently: Take 1,000 mg by mouth 2 (two) times daily. 08/19/20   Birdie Sons, MD  SPIRIVA HANDIHALER 18 MCG inhalation capsule PLACE 1 CAPSULE INTO INHALER AND INHALE BY MOUTH ONCE A DAY. 11/06/20   Birdie Sons, MD  torsemide (DEMADEX) 100 MG tablet Take 0.5 tablets (50 mg total) by mouth daily. 05/13/20   Birdie Sons, MD  traZODone (DESYREL) 150 MG tablet TAKE ONE TABLET BY MOUTH AT BEDTIME 11/25/20   Birdie Sons, MD  venlafaxine XR (EFFEXOR-XR) 75 MG 24  hr capsule TAKE 1 CAPSULE BY MOUTH DAILY WITH BREAKFAST Patient taking differently: Take 75 mg by mouth daily with breakfast. 08/19/20   Birdie Sons, MD    Inpatient Medications:  . insulin aspart  0-5 Units Subcutaneous QHS  . insulin aspart  0-9 Units Subcutaneous TID WC     Allergies:  Allergies  Allergen Reactions  . Demerol  [Meperidine Hcl]   . Demerol [Meperidine] Hives  . Jardiance [Empagliflozin] Other (See Comments)    Perineal pain  . Prednisone Other (See Comments) and Hypertension    Pt states that this medication puts him in A-fib   . Sulfa Antibiotics Hives  . Albuterol Sulfate [Albuterol] Palpitations and Other (See Comments)    Pt currently uses this medication.    . Morphine Sulfate Nausea And Vomiting, Rash and Other (See Comments)    Pt states that he is only allergic to the tablet form of this medication.      Social History   Socioeconomic History  . Marital status: Widowed    Spouse name: Not on file  . Number of children: 1  . Years of education: Not on file  . Highest education level: 10th grade  Occupational History  . Occupation: Disabled  . Occupation: on Fish farm manager  Tobacco Use  . Smoking status: Former Smoker    Packs/day: 3.00    Years: 50.00    Pack years: 150.00    Types: Cigarettes    Quit date: 04/22/2013    Years since quitting: 7.6  . Smokeless tobacco: Never Used  Vaping Use  . Vaping Use: Never used  Substance and Sexual Activity  . Alcohol use: No    Comment: remotely quit alcohol use. Hx of heavy alcohol use.  . Drug use: Yes    Types: Marijuana    Comment: occasionally  . Sexual activity: Not on file  Other Topics Concern  . Not on file  Social History Narrative   Pt lives in [place] with wife.  Does not routinely exercise.   Social Determinants of Health   Financial Resource Strain: Low Risk   . Difficulty of Paying Living Expenses: Not hard at all  Food Insecurity: No Food Insecurity  . Worried  About Charity fundraiser in the Last Year: Never true  . Ran Out of Food in the Last Year: Never true  Transportation Needs: No Transportation Needs  . Lack of Transportation (Medical): No  . Lack of Transportation (Non-Medical): No  Physical Activity: Inactive  . Days of Exercise per Week: 0 days  . Minutes of Exercise per Session: 0 min  Stress: No Stress Concern Present  . Feeling of Stress : Only a little  Social Connections: Moderately Isolated  . Frequency of Communication with Friends and Family: More than three times a week  . Frequency of Social Gatherings with Friends and Family: More than three times a week  . Attends Religious Services: More than 4 times per year  . Active Member of Clubs or Organizations: No  . Attends Archivist Meetings: Never  . Marital Status: Widowed  Intimate Partner Violence: Not At Risk  . Fear of Current or Ex-Partner: No  . Emotionally Abused: No  . Physically Abused: No  . Sexually Abused: No     Family History  Problem Relation Age of Onset  . Heart attack Mother   . Depression Mother   . Heart disease Mother   . COPD Mother   . Hypertension Mother   . Heart attack Father   . Diabetes Father   . Depression Father   . Heart disease Father   . Cirrhosis Father   . Parkinson's disease Brother  Review of Systems: A 12-system review of systems was performed and is negative except as noted in the HPI.  Labs: No results for input(s): CKTOTAL, CKMB, TROPONINI in the last 72 hours. Lab Results  Component Value Date   WBC 7.6 12/10/2020   HGB 18.7 (H) 12/10/2020   HCT 53.8 (H) 12/10/2020   MCV 94.4 12/10/2020   PLT 182 12/10/2020    Recent Labs  Lab 12/10/20 2018  NA 141  K 3.7  CL 103  CO2 18*  BUN 15  CREATININE 0.81  CALCIUM 9.3  GLUCOSE 180*   Lab Results  Component Value Date   CHOL 215 (H) 09/27/2020   HDL 29 (L) 09/27/2020   LDLCALC UNABLE TO CALCULATE IF TRIGLYCERIDE OVER 400 mg/dL 09/27/2020    TRIG 488 (H) 09/27/2020   No results found for: DDIMER  Radiology/Studies:  DG Chest 2 View  Result Date: 12/10/2020 CLINICAL DATA:  65 year old male with chest pain. EXAM: CHEST - 2 VIEW COMPARISON:  Chest radiograph dated 11/24/2020. FINDINGS: No focal consolidation, pleural effusion or pneumothorax. Mild cardiomegaly. Median sternotomy wires, CABG vascular clips, and coronary stent. No acute osseous pathology. IMPRESSION: No active cardiopulmonary disease. Electronically Signed   By: Anner Crete M.D.   On: 12/10/2020 20:35   DG Chest 2 View  Result Date: 11/24/2020 CLINICAL DATA:  Chest pain/pressure. EXAM: CHEST - 2 VIEW COMPARISON:  Radiograph and CT 10/06/2020 FINDINGS: Post median sternotomy and CABG. The heart is normal in size. Unchanged mediastinal contours. Chronic hyperinflation with diaphragmatic flattening. No pulmonary edema, focal airspace disease, pleural fluid or pneumothorax. Thoracic spondylosis. No acute osseous abnormalities are seen. IMPRESSION: 1. No acute chest findings. 2. Post CABG. Chronic hyperinflation. Electronically Signed   By: Keith Rake M.D.   On: 11/24/2020 03:48   CT Head Wo Contrast  Result Date: 12/11/2020 CLINICAL DATA:  Syncope, head injury, chronic anticoagulation EXAM: CT HEAD WITHOUT CONTRAST TECHNIQUE: Contiguous axial images were obtained from the base of the skull through the vertex without intravenous contrast. COMPARISON:  None. FINDINGS: Brain: Normal anatomic configuration. No abnormal intra or extra-axial mass lesion or fluid collection. No abnormal mass effect or midline shift. No evidence of acute intracranial hemorrhage or infarct. Ventricular size is normal. Cerebellum unremarkable. Vascular: Unremarkable Skull: Intact Sinuses/Orbits: Paranasal sinuses are clear. Orbits are unremarkable. Other: Mastoid air cells and middle ear cavities are clear. IMPRESSION: No acute intracranial injury.  No calvarial fracture Electronically Signed    By: Fidela Salisbury MD   On: 12/11/2020 01:17   CT Abdomen Pelvis W Contrast  Result Date: 11/24/2020 CLINICAL DATA:  Flank pain with kidney stone suspected. Left upper quadrant pain in the setting of polycythemia EXAM: CT ABDOMEN AND PELVIS WITH CONTRAST TECHNIQUE: Multidetector CT imaging of the abdomen and pelvis was performed using the standard protocol following bolus administration of intravenous contrast. CONTRAST:  14m OMNIPAQUE IOHEXOL 300 MG/ML  SOLN COMPARISON:  05/26/2018 FINDINGS: Lower chest:  No contributory findings. Hepatobiliary: Hepatic steatosis without focal abnormality.Cholecystectomy without bile duct dilatation. Pancreas: Unremarkable. Spleen: Unremarkable. Adrenals/Urinary Tract: Stable mild bilateral adrenal lobulation. No hydronephrosis or stone. 6 mm right interpolar renal lesion which is too small for accurate densitometry, size stable from 2019. Unremarkable bladder. Stomach/Bowel: No obstruction. No appendicitis. Outpouching at the gastric cardia. There is no history of fundoplication, most consistent with a small paraesophageal hernia. Mild distal colonic diverticulosis. Vascular/Lymphatic: No acute vascular abnormality. Atheromatous calcifications of the aorta and iliacs. No mass or adenopathy. Reproductive:No pathologic findings. Other: No  ascites or pneumoperitoneum. Shallow, fatty left inguinal hernia. Musculoskeletal: No acute abnormalities. IMPRESSION: 1. No worrisome renal lesion. There is a 6 mm right renal nodule that is too small for characterization but size stable from 2019. 2. Hepatic steatosis and mild colonic diverticulosis. 3.  Aortic Atherosclerosis (ICD10-I70.0). 4. Small paraesophageal hiatal hernia. 5. Fatty left inguinal hernia. Electronically Signed   By: Monte Fantasia M.D.   On: 11/24/2020 04:18    Wt Readings from Last 3 Encounters:  12/10/20 97.5 kg  12/10/20 97.5 kg  11/23/20 102.1 kg    EKG: Sinus bradycardia with nonspecific ST-T wave  changes.  Physical Exam:  Blood pressure (!) 160/80, pulse (!) 56, temperature 97.7 F (36.5 C), temperature source Oral, resp. rate 18, height 5' 7"  (1.702 m), weight 97.5 kg, SpO2 97 %. Body mass index is 33.67 kg/m. General: Well developed, well nourished, in no acute distress. Head: Normocephalic, atraumatic, sclera non-icteric, no xanthomas, nares are without discharge.  Neck: Negative for carotid bruits. JVD not elevated. Lungs: Clear bilaterally to auscultation without wheezes, rales, or rhonchi. Breathing is unlabored. Heart: RRR with S1 S2. No murmurs, rubs, or gallops appreciated. Abdomen: Soft, non-tender, non-distended with normoactive bowel sounds. No hepatomegaly. No rebound/guarding. No obvious abdominal masses. Msk:  Strength and tone appear normal for age. Extremities: No clubbing or cyanosis. No edema.  Distal pedal pulses are 2+ and equal bilaterally. Neuro: Alert and oriented X 3. No facial asymmetry. No focal deficit. Moves all extremities spontaneously. Psych:  Responds to questions appropriately with a normal affect.     Assessment and Plan  65 year old male with history of coronary artery disease status post PCI as well as CABG.  To most recent cath in 2020 and 2021 showed patent stents.  Now presents with chest pain is ruled out for a myocardial infarction.  Had an apparent lightheaded episode that he states resulted in syncope although this was not witnessed.  1.  Chest pain-atypical chest pain worse with deep palpation or deep breathing.  EKG unchanged.  Has ruled out for myocardial infarction with high-sensitivity troponin of 13x2.  Pain appears to be nonischemic.  Would continue with medical management with Farxiga 10 mg daily, atorvastatin 80 mg daily, amlodipine 2.5 mg daily, apixaban 5 mg twice daily, Imdur 120 mg daily, lisinopril 5 mg daily, and Ranexa 500 mg twice daily as well as Betapace 80 mg twice daily.  2.  Atrial fibrillation continue with sotalol and  metoprolol as as well as sotalol.  3.  Chest pain-atypical.  Avoid narcotics.    Signed, Teodoro Spray MD 12/11/2020, 12:10 PM Pager: [phone]

## 2020-12-11 NOTE — ED Notes (Signed)
Messaged Dr Damita Dunnings re: pt ongoing CP rated 9/10 desctibed as pressure, mid chest, unchanged since yesterday at 1600.

## 2020-12-12 DIAGNOSIS — E785 Hyperlipidemia, unspecified: Secondary | ICD-10-CM

## 2020-12-12 DIAGNOSIS — E872 Acidosis: Secondary | ICD-10-CM | POA: Diagnosis not present

## 2020-12-12 DIAGNOSIS — J449 Chronic obstructive pulmonary disease, unspecified: Secondary | ICD-10-CM | POA: Diagnosis not present

## 2020-12-12 DIAGNOSIS — R079 Chest pain, unspecified: Secondary | ICD-10-CM

## 2020-12-12 DIAGNOSIS — I25118 Atherosclerotic heart disease of native coronary artery with other forms of angina pectoris: Secondary | ICD-10-CM

## 2020-12-12 DIAGNOSIS — R55 Syncope and collapse: Secondary | ICD-10-CM | POA: Diagnosis not present

## 2020-12-12 DIAGNOSIS — R0789 Other chest pain: Secondary | ICD-10-CM | POA: Diagnosis not present

## 2020-12-12 DIAGNOSIS — F419 Anxiety disorder, unspecified: Secondary | ICD-10-CM

## 2020-12-12 LAB — LIPID PANEL
Cholesterol: 110 mg/dL (ref 0–200)
HDL: 23 mg/dL — ABNORMAL LOW (ref >40)
LDL Cholesterol: 42 mg/dL (ref 0–99)
Total CHOL/HDL Ratio: 4.8 RATIO
Triglycerides: 224 mg/dL — ABNORMAL HIGH (ref <150)
VLDL: 45 mg/dL — ABNORMAL HIGH (ref 0–40)

## 2020-12-12 LAB — POTASSIUM: Potassium: 4.1 mmol/L (ref 3.5–5.1)

## 2020-12-12 LAB — HEMOGLOBIN A1C
Hgb A1c MFr Bld: 7.5 % — ABNORMAL HIGH (ref 4.8–5.6)
Mean Plasma Glucose: 168.55 mg/dL

## 2020-12-12 LAB — GLUCOSE, CAPILLARY
Glucose-Capillary: 161 mg/dL — ABNORMAL HIGH (ref 70–99)
Glucose-Capillary: 230 mg/dL — ABNORMAL HIGH (ref 70–99)

## 2020-12-12 LAB — MAGNESIUM: Magnesium: 1.8 mg/dL (ref 1.7–2.4)

## 2020-12-12 MED ORDER — ACETAMINOPHEN 325 MG PO TABS: 650.0000 mg | ORAL_TABLET | Freq: Four times a day (QID) | ORAL | Status: DC | PRN

## 2020-12-12 MED ORDER — SOTALOL HCL 80 MG PO TABS
40.0000 mg | ORAL_TABLET | Freq: Two times a day (BID) | ORAL | Status: DC
  Administered 2020-12-12: 40 mg via ORAL
  Filled 2020-12-12 (×2): qty 0.5

## 2020-12-12 NOTE — Discharge Summary (Signed)
Physician Discharge Summary  Lawrence [name] VEL:[id number] DOB: [birth year] DOA: 12/11/2020  PCP: Birdie Sons, MD  Admit date: 12/11/2020 Discharge date: 12/12/2020  Admitted From: Home Disposition: Home  Recommendations for Outpatient Follow-up:  1. Follow up with PCP in 1-2 weeks 2. Follow-up with cardiology 3. Please obtain BMP/CBC in one week 4. Please follow up on the following pending results: None  Home Health: No Equipment/Devices: Home oxygen Discharge Condition: Stable CODE STATUS: Full Diet recommendation: Heart Healthy / Carb Modified   Brief/Interim Summary:  Lawrence [name] is a 65 y.o. male with medical history significant of hypertension, hyperlipidemia, diabetes mellitus, chronic respiratory failure with COPD on 2 L of oxygen at home, asthma, GERD, anxiety, CAD, CABG/stent placement, atrial fibrillation on Eliquis, polycythemia (being worked up by Dr. Grayland Ormond of hematology currently), OSA, sCHF with EF 45-45%, skin cancer, who presents with chest pain and syncope. Patient felt lightheaded after walking his dog when he came back in the house.  According to patient he lost consciousness after feeling dizzy for few minutes.  No redness.  No other associated symptoms.  On presentation he was found to have COVID-19 negative, D-dimer negative, UDS positive for cannabinoid, hemodynamically stable and rest of the labs within normal limit.  CT head was negative for any acute abnormality. EKG without any acute changes and troponin remain negative.  Chest pain thought to be atypical and not related to ACS.  He was found to have borderline bradycardia with heart rate in 40s and 50es. Per cardiology note they were recommending discontinuation of metoprolol and decreasing his home dose of sotalol from 80 mg twice daily to 40 mg twice daily.  These medications were not listed on his home medication list, he was advised as told by cardiology to adjust his medications and he will  follow-up with his cardiologist who Lawrence provide him new prescriptions.  He will continue with rest of his home medications and follow-up with his providers.  Discharge Diagnoses:  Principal Problem:   Chest pain Active Problems:   CAD (coronary artery disease)   HTN (hypertension)   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HCC)   GERD (gastroesophageal reflux disease)   HLD (hyperlipidemia)   COPD (chronic obstructive pulmonary disease) (HCC)   Chronic systolic CHF (congestive heart failure) (HCC)   Anxiety   Paroxysmal atrial fibrillation (HCC)   Polycythemia   Syncope   Acidosis, metabolic  Discharge Instructions  Discharge Instructions    Diet - low sodium heart healthy   Complete by: As directed    Diet - low sodium heart healthy   Complete by: As directed    Discharge instructions   Complete by: As directed    It was pleasure taking care of you. Please work with your primary care provider to decrease your pain medications, you were also taking Xanax and muscle relaxants which Lawrence affect your breathing and Lawrence cause this type of episodes. Keep yourself well-hydrated.   Discharge instructions   Complete by: As directed    It was pleasure taking care of you. As we discuss please decrease the dose of your sotalol to 40 mg instead of 80 as advised by your cardiologist. Do not take metoprolol but continue taking your Metformin which is for your diabetes. Follow-up with your cardiologist closely for further management of your medications.   Increase activity slowly   Complete by: As directed    Increase activity slowly   Complete by: As directed      Allergies  as of 12/12/2020      Reactions   Demerol  [meperidine Hcl]    Demerol [meperidine] Hives   Jardiance [empagliflozin] Other (See Comments)   Perineal pain   Prednisone Other (See Comments), Hypertension   Pt states that this medication puts him in A-fib    Sulfa Antibiotics Hives   Albuterol Sulfate  [albuterol] Palpitations, Other (See Comments)   Pt currently uses this medication.     Morphine Sulfate Nausea And Vomiting, Rash, Other (See Comments)   Pt states that he is only allergic to the tablet form of this medication.        Medication List    STOP taking these medications   Lidocaine HCl 4 % Crea     TAKE these medications   albuterol 108 (90 Base) MCG/ACT inhaler Commonly known as: VENTOLIN HFA INHALE 2 PUFFS BY MOUTH EVERY 6 HOURS AS NEEDED FOR SHORTNESS OF BREATH What changed:   how much to take  how to take this  when to take this  reasons to take this  additional instructions   allopurinol 300 MG tablet Commonly known as: ZYLOPRIM TAKE 1 TABLET BY MOUTH TWICE A DAY   ALPRAZolam 1 MG tablet Commonly known as: XANAX TAKE ONE TABLET BY MOUTH THREE TIMES A DAY   amLODipine 2.5 MG tablet Commonly known as: NORVASC Take 1 tablet (2.5 mg total) by mouth daily.   aspirin 81 MG chewable tablet Chew 81 mg by mouth daily. Swallows whole   atorvastatin 80 MG tablet Commonly known as: LIPITOR TAKE ONE TABLET BY MOUTH AT BEDTIME   Breo Ellipta 100-25 MCG/INH Aepb Generic drug: fluticasone furoate-vilanterol INHALE 1 PUFF INTO THE LUNGS DAILY   budesonide 0.5 MG/2ML nebulizer solution Commonly known as: PULMICORT Take 0.5 mg by nebulization 2 (two) times daily as needed (shortness of breath or wheezing).   cetirizine 10 MG tablet Commonly known as: ZYRTEC TAKE ONE TABLET BY MOUTH AT BEDTIME   docusate sodium 100 MG capsule Commonly known as: COLACE Take 100 mg by mouth daily as needed for mild constipation.   Eliquis 5 MG Tabs tablet Generic drug: apixaban TAKE 1 TABLET BY MOUTH TWICE A DAY   Farxiga 10 MG Tabs tablet Generic drug: dapagliflozin propanediol TAKE 1 TABLET BY MOUTH DAILY What changed: how much to take   isosorbide mononitrate 120 MG 24 hr tablet Commonly known as: IMDUR TAKE 1 TABLET BY MOUTH DAILY   Linzess 72 MCG  capsule Generic drug: linaclotide Take 72 mcg by mouth daily before breakfast.   lisinopril 10 MG tablet Commonly known as: ZESTRIL TAKE 1 TABLET BY MOUTH DAILY   magnesium oxide 400 (241.3 Mg) MG tablet Commonly known as: MAG-OX Take 1 tablet (400 mg total) by mouth every morning.   metaxalone 800 MG tablet Commonly known as: SKELAXIN TAKE 1 TABLET BY MOUTH 3 TIMES A DAY AS NEEDED FOR PAIN What changed: See the new instructions.   metFORMIN 500 MG 24 hr tablet Commonly known as: GLUCOPHAGE-XR Take 500 mg by mouth 2 (two) times daily.   methocarbamol 500 MG tablet Commonly known as: ROBAXIN Take by mouth.   naloxone 4 MG/0.1ML Liqd nasal spray kit Commonly known as: NARCAN 1 spray into nostril x1 and may repeat every 2-3 minutes until patient is responsive or EMS arrives   nitroGLYCERIN 0.4 MG SL tablet Commonly known as: NITROSTAT PLACE ONE TABLET UNDER THE TONGUE EVERY 5 MINUTES FOR CHEST PAIN. IF NO RELIEF PAST 3RD TAB GO TO EMERGENCY  ROOM What changed: See the new instructions.   omega-3 acid ethyl esters 1 g capsule Commonly known as: LOVAZA TAKE 4 CAPSULES BY MOUTH DAILY   omeprazole 40 MG capsule Commonly known as: PRILOSEC Take 40 mg by mouth daily.   oxyCODONE-acetaminophen 10-325 MG tablet Commonly known as: PERCOCET TAKE 1 TABLET BY MOUTH EVERY 4 HOURS AS NEEDED FOR PAIN   ranolazine 1000 MG SR tablet Commonly known as: RANEXA TAKE 1 TABLET BY MOUTH TWICE A DAY   Spiriva HandiHaler 18 MCG inhalation capsule Generic drug: tiotropium PLACE 1 CAPSULE INTO INHALER AND INHALE BY MOUTH ONCE A DAY. What changed: See the new instructions.   torsemide 100 MG tablet Commonly known as: DEMADEX Take 0.5 tablets (50 mg total) by mouth daily.   traZODone 150 MG tablet Commonly known as: DESYREL TAKE ONE TABLET BY MOUTH AT BEDTIME   venlafaxine XR 75 MG 24 hr capsule Commonly known as: EFFEXOR-XR TAKE 1 CAPSULE BY MOUTH DAILY WITH BREAKFAST What changed:  See the new instructions.       Follow-up Information    Teodoro Spray, MD Follow up in 1 week(s).   Specialty: Cardiology Contact information: [address]  [place] [address] [phone]              Allergies  Allergen Reactions  . Demerol  [Meperidine Hcl]   . Demerol [Meperidine] Hives  . Jardiance [Empagliflozin] Other (See Comments)    Perineal pain  . Prednisone Other (See Comments) and Hypertension    Pt states that this medication puts him in A-fib   . Sulfa Antibiotics Hives  . Albuterol Sulfate [Albuterol] Palpitations and Other (See Comments)    Pt currently uses this medication.    . Morphine Sulfate Nausea And Vomiting, Rash and Other (See Comments)    Pt states that he is only allergic to the tablet form of this medication.      Consultations:  Cardiology  Procedures/Studies: DG Chest 2 View  Result Date: 12/10/2020 CLINICAL DATA:  65 year old male with chest pain. EXAM: CHEST - 2 VIEW COMPARISON:  Chest radiograph dated 11/24/2020. FINDINGS: No focal consolidation, pleural effusion or pneumothorax. Mild cardiomegaly. Median sternotomy wires, CABG vascular clips, and coronary stent. No acute osseous pathology. IMPRESSION: No active cardiopulmonary disease. Electronically Signed   By: Anner Crete M.D.   On: 12/10/2020 20:35   DG Chest 2 View  Result Date: 11/24/2020 CLINICAL DATA:  Chest pain/pressure. EXAM: CHEST - 2 VIEW COMPARISON:  Radiograph and CT 10/06/2020 FINDINGS: Post median sternotomy and CABG. The heart is normal in size. Unchanged mediastinal contours. Chronic hyperinflation with diaphragmatic flattening. No pulmonary edema, focal airspace disease, pleural fluid or pneumothorax. Thoracic spondylosis. No acute osseous abnormalities are seen. IMPRESSION: 1. No acute chest findings. 2. Post CABG. Chronic hyperinflation. Electronically Signed   By: Keith Rake M.D.   On: 11/24/2020 03:48   CT Head Wo Contrast  Result  Date: 12/11/2020 CLINICAL DATA:  Syncope, head injury, chronic anticoagulation EXAM: CT HEAD WITHOUT CONTRAST TECHNIQUE: Contiguous axial images were obtained from the base of the skull through the vertex without intravenous contrast. COMPARISON:  None. FINDINGS: Brain: Normal anatomic configuration. No abnormal intra or extra-axial mass lesion or fluid collection. No abnormal mass effect or midline shift. No evidence of acute intracranial hemorrhage or infarct. Ventricular size is normal. Cerebellum unremarkable. Vascular: Unremarkable Skull: Intact Sinuses/Orbits: Paranasal sinuses are clear. Orbits are unremarkable. Other: Mastoid air cells and middle ear cavities are clear. IMPRESSION: No acute  intracranial injury.  No calvarial fracture Electronically Signed   By: Fidela Salisbury MD   On: 12/11/2020 01:17   MR BRAIN WO CONTRAST  Result Date: 12/11/2020 CLINICAL DATA:  Syncope, simple, normal neuro exam EXAM: MRI HEAD WITHOUT CONTRAST TECHNIQUE: Multiplanar, multiecho pulse sequences of the brain and surrounding structures were obtained without intravenous contrast. COMPARISON:  12/11/2020 and prior. FINDINGS: Brain: No diffusion-weighted signal abnormality. Remote left occipital microhemorrhage. No midline shift, ventriculomegaly or extra-axial fluid collection. No mass lesion. Minimal chronic microvascular ischemic changes. Vascular: Normal flow voids. Skull and upper cervical spine: Normal marrow signal. Sinuses/Orbits: Normal orbits. Mild pansinus mucosal thickening. Small nasopharyngeal mucous retention cyst. No mastoid effusion. Other: Small right parietal scalp lipoma. IMPRESSION: No acute intracranial process. Remote left occipital microhemorrhage. Minimal chronic microvascular ischemic changes. Electronically Signed   By: Primitivo Gauze M.D.   On: 12/11/2020 12:36   CT Abdomen Pelvis W Contrast  Result Date: 11/24/2020 CLINICAL DATA:  Flank pain with kidney stone suspected. Left upper  quadrant pain in the setting of polycythemia EXAM: CT ABDOMEN AND PELVIS WITH CONTRAST TECHNIQUE: Multidetector CT imaging of the abdomen and pelvis was performed using the standard protocol following bolus administration of intravenous contrast. CONTRAST:  139m OMNIPAQUE IOHEXOL 300 MG/ML  SOLN COMPARISON:  05/26/2018 FINDINGS: Lower chest:  No contributory findings. Hepatobiliary: Hepatic steatosis without focal abnormality.Cholecystectomy without bile duct dilatation. Pancreas: Unremarkable. Spleen: Unremarkable. Adrenals/Urinary Tract: Stable mild bilateral adrenal lobulation. No hydronephrosis or stone. 6 mm right interpolar renal lesion which is too small for accurate densitometry, size stable from 2019. Unremarkable bladder. Stomach/Bowel: No obstruction. No appendicitis. Outpouching at the gastric cardia. There is no history of fundoplication, most consistent with a small paraesophageal hernia. Mild distal colonic diverticulosis. Vascular/Lymphatic: No acute vascular abnormality. Atheromatous calcifications of the aorta and iliacs. No mass or adenopathy. Reproductive:No pathologic findings. Other: No ascites or pneumoperitoneum. Shallow, fatty left inguinal hernia. Musculoskeletal: No acute abnormalities. IMPRESSION: 1. No worrisome renal lesion. There is a 6 mm right renal nodule that is too small for characterization but size stable from 2019. 2. Hepatic steatosis and mild colonic diverticulosis. 3.  Aortic Atherosclerosis (ICD10-I70.0). 4. Small paraesophageal hiatal hernia. 5. Fatty left inguinal hernia. Electronically Signed   By: JMonte FantasiaM.D.   On: 11/24/2020 04:18     Subjective: Patient was feeling better when seen today.  No new complaint.  Continues to have some chest discomfort with deep breathing.  Discharge Exam: Vitals:   12/12/20 0800 12/12/20 1145  BP: (!) 116/58 134/77  Pulse: (!) 50 (!) 52  Resp:  19  Temp: 97.6 F (36.4 C) 97.8 F (36.6 C)  SpO2: 100% 100%    Vitals:   12/11/20 2124 12/12/20 0554 12/12/20 0800 12/12/20 1145  BP:  (!) 104/42 (!) 116/58 134/77  Pulse:  (!) 45 (!) 50 (!) 52  Resp:  18  19  Temp: 97.6 F (36.4 C) (!) 97.5 F (36.4 C) 97.6 F (36.4 C) 97.8 F (36.6 C)  TempSrc: Oral Oral Oral Oral  SpO2:  100% 100% 100%  Weight:  99.4 kg    Height:        General: Pt is alert, awake, not in acute distress Cardiovascular: RRR, S1/S2 +, no rubs, no gallops Respiratory: CTA bilaterally, no wheezing, no rhonchi Abdominal: Soft, NT, ND, bowel sounds + Extremities: no edema, no cyanosis   The results of significant diagnostics from this hospitalization (including imaging, microbiology, ancillary and laboratory) are listed below for reference.  Microbiology: Recent Results (from the past 240 hour(s))  SARS Coronavirus 2 by RT PCR (hospital order, performed in [facility] hospital lab) Nasopharyngeal Nasopharyngeal Swab     Status: None   Collection Time: 12/11/20  7:30 AM   Specimen: Nasopharyngeal Swab  Result Value Ref Range Status   SARS Coronavirus 2 NEGATIVE NEGATIVE Final    Comment: (NOTE) SARS-CoV-2 target nucleic acids are NOT DETECTED.  The SARS-CoV-2 RNA is generally detectable in upper and lower respiratory specimens during the acute phase of infection. The lowest concentration of SARS-CoV-2 viral copies this assay Lawrence detect is 250 copies / mL. A negative result does not preclude SARS-CoV-2 infection and should not be used as the sole basis for treatment or other patient management decisions.  A negative result may occur with improper specimen collection / handling, submission of specimen other than nasopharyngeal swab, presence of viral mutation(s) within the areas targeted by this assay, and inadequate number of viral copies (<250 copies / mL). A negative result must be combined with clinical observations, patient history, and epidemiological information.  Fact Sheet for Patients:    StrictlyIdeas.no  Fact Sheet for Healthcare Providers: BankingDealers.co.za  This test is not yet approved or  cleared by the [place] FDA and has been authorized for detection and/or diagnosis of SARS-CoV-2 by FDA under an Emergency Use Authorization (EUA).  This EUA will remain in effect (meaning this test Lawrence be used) for the duration of the COVID-19 declaration under Section 564(b)(1) of the Act, 21 U.S.C. section 360bbb-3(b)(1), unless the authorization is terminated or revoked sooner.  Performed at [facility], [address] [place], [place] [address]      Labs: BNP (last 3 results) Recent Labs    03/07/20 1226 12/11/20 0845  BNP 171.0* 68.3   Basic Metabolic Panel: Recent Labs  Lab 12/10/20 2018  NA 141  K 3.7  CL 103  CO2 18*  GLUCOSE 180*  BUN 15  CREATININE 0.81  CALCIUM 9.3   Liver Function Tests: No results for input(s): AST, ALT, ALKPHOS, BILITOT, PROT, ALBUMIN in the last 168 hours. No results for input(s): LIPASE, AMYLASE in the last 168 hours. No results for input(s): AMMONIA in the last 168 hours. CBC: Recent Labs  Lab 12/10/20 1035 12/10/20 2018  WBC 8.3 7.6  NEUTROABS 5.6  --   HGB 19.9* 18.7*  HCT 58.8* 53.8*  MCV 94.5 94.4  PLT 193 182   Cardiac Enzymes: No results for input(s): CKTOTAL, CKMB, CKMBINDEX, TROPONINI in the last 168 hours. BNP: Invalid input(s): POCBNP CBG: Recent Labs  Lab 12/11/20 1234 12/11/20 1657 12/11/20 2310 12/12/20 0836 12/12/20 1144  GLUCAP 132* 163* 138* 161* 230*   D-Dimer No results for input(s): DDIMER in the last 72 hours. Hgb A1c Recent Labs    12/12/20 0545  HGBA1C 7.5*   Lipid Profile Recent Labs    12/12/20 0545  CHOL 110  HDL 23*  LDLCALC 42  TRIG 224*  CHOLHDL 4.8   Thyroid function studies No results for input(s): TSH, T4TOTAL, T3FREE, THYROIDAB in the last 72 hours.  Invalid input(s): FREET3 Anemia work  up No results for input(s): VITAMINB12, FOLATE, FERRITIN, TIBC, IRON, RETICCTPCT in the last 72 hours. Urinalysis    Component Value Date/Time   COLORURINE YELLOW (A) 11/24/2020 0432   APPEARANCEUR CLEAR (A) 11/24/2020 0432   APPEARANCEUR Clear 05/25/2019 1554   LABSPEC 1.031 (H) 11/24/2020 0432   LABSPEC 1.012 03/07/2015 2143   PHURINE 5.0 11/24/2020 0432  GLUCOSEU >=500 (A) 11/24/2020 0432   GLUCOSEU Negative 03/07/2015 2143   HGBUR NEGATIVE 11/24/2020 0432   BILIRUBINUR NEGATIVE 11/24/2020 0432   BILIRUBINUR Negative 05/25/2019 1554   BILIRUBINUR Negative 03/07/2015 2143   KETONESUR NEGATIVE 11/24/2020 0432   PROTEINUR NEGATIVE 11/24/2020 0432   UROBILINOGEN 0.2 03/26/2017 0907   NITRITE NEGATIVE 11/24/2020 0432   LEUKOCYTESUR NEGATIVE 11/24/2020 0432   LEUKOCYTESUR Negative 03/07/2015 2143   Sepsis Labs Invalid input(s): PROCALCITONIN,  WBC,  LACTICIDVEN Microbiology Recent Results (from the past 240 hour(s))  SARS Coronavirus 2 by RT PCR (hospital order, performed in [facility] hospital lab) Nasopharyngeal Nasopharyngeal Swab     Status: None   Collection Time: 12/11/20  7:30 AM   Specimen: Nasopharyngeal Swab  Result Value Ref Range Status   SARS Coronavirus 2 NEGATIVE NEGATIVE Final    Comment: (NOTE) SARS-CoV-2 target nucleic acids are NOT DETECTED.  The SARS-CoV-2 RNA is generally detectable in upper and lower respiratory specimens during the acute phase of infection. The lowest concentration of SARS-CoV-2 viral copies this assay Lawrence detect is 250 copies / mL. A negative result does not preclude SARS-CoV-2 infection and should not be used as the sole basis for treatment or other patient management decisions.  A negative result may occur with improper specimen collection / handling, submission of specimen other than nasopharyngeal swab, presence of viral mutation(s) within the areas targeted by this assay, and inadequate number of viral copies (<250 copies /  mL). A negative result must be combined with clinical observations, patient history, and epidemiological information.  Fact Sheet for Patients:   StrictlyIdeas.no  Fact Sheet for Healthcare Providers: BankingDealers.co.za  This test is not yet approved or  cleared by the [place] FDA and has been authorized for detection and/or diagnosis of SARS-CoV-2 by FDA under an Emergency Use Authorization (EUA).  This EUA will remain in effect (meaning this test Lawrence be used) for the duration of the COVID-19 declaration under Section 564(b)(1) of the Act, 21 U.S.C. section 360bbb-3(b)(1), unless the authorization is terminated or revoked sooner.  Performed at [facility], [address] [place], [place] [address]     Time coordinating discharge: Over 30 minutes  SIGNED:  Lorella Nimrod, MD  [facility] 12/12/2020, 12:35 PM  If 7PM-7AM, please contact night-coverage [email or web address]  This record has been created using Systems analyst. Errors have been sought and corrected,but may not always be located. Such creation errors do not reflect on the standard of care.

## 2020-12-12 NOTE — Care Management Obs Status (Signed)
[facility] NOTIFICATION   Patient Details  Name: Lawrence [name] MRN: [id number] Date of Birth: [birth year]   Medicare Observation Status Notification Given:  Yes    Kerin Salen, RN 12/12/2020, 9:40 AM

## 2020-12-12 NOTE — Care Management CC44 (Signed)
Condition Code 44 Documentation Completed  Patient Details  Name: BLADIMIR AUMAN MRN: [id number] Date of Birth: [birth year]   Condition Code 44 given:  Yes Patient signature on Condition Code 44 notice:  Yes Documentation of 2 MD's agreement:  Yes Code 44 added to claim:  Yes    Kerin Salen, RN 12/12/2020, 9:41 AM

## 2020-12-12 NOTE — Progress Notes (Signed)
Patient Name: Lawrence [name] Date of Encounter: 12/12/2020  Hospital Problem List     Principal Problem:   Chest pain Active Problems:   CAD (coronary artery disease)   HTN (hypertension)   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HCC)   GERD (gastroesophageal reflux disease)   HLD (hyperlipidemia)   COPD (chronic obstructive pulmonary disease) (HCC)   Chronic systolic CHF (congestive heart failure) (HCC)   Anxiety   Paroxysmal atrial fibrillation (HCC)   Polycythemia   Syncope   Acidosis, metabolic    Patient Profile     65 y.o. male with history of  Chronic pain syndrome, coronary artery disease status post carotid bypass grafting, COPD, atrial fibrillation anticoagulated with Eliquis, history of polycythemia, who presented to emergency room with complaints of chest pain as well and was in the reported syncopal episode while walking his dog.  This does not appear to have been witnessed.  He stated he felt lightheaded after walking his dog and then had a syncopal episode per his report.  He has shortness of breath but no fever or chills.  EKG reveals sinus bradycardia at a rate of 59 with no ischemic changes.  His troponin is normal.  Urine drug screen positive for cannabinoids.  BNP was normal at 52.8.  Echocardiogram done 2 months ago showed EF 45 to 50% with mild anteroseptal and apical hypokinesis.  No significant valvular abnormalities.  Cardiac catheterization done in April 2021 showed widely patent stents with no change from cath done in 2020.  Medical management was recommended then.  Chest pain is somewhat atypical worsened with deep inspiration and deep palpation.  No arrhythmias on telemetry.  Patient stated he is compliant with medications.  Subjective   Pain has improved somewhat.  Still has myalgias.  Chest pain with deep palpation.  Inpatient Medications    . amLODipine  2.5 mg Oral Daily  . apixaban  5 mg Oral BID  . aspirin  81 mg Oral Daily  .  atorvastatin  80 mg Oral QHS  . fluticasone furoate-vilanterol  1 puff Inhalation Daily  . insulin aspart  0-5 Units Subcutaneous QHS  . insulin aspart  0-9 Units Subcutaneous TID WC  . isosorbide mononitrate  120 mg Oral Daily  . lisinopril  10 mg Oral Daily  . loratadine  10 mg Oral Daily  . magnesium oxide  400 mg Oral BH-q7a  . metaxalone  400 mg Oral TID  . omega-3 acid ethyl esters  4 g Oral Daily  . pantoprazole  40 mg Oral Daily  . ranolazine  1,000 mg Oral BID  . sotalol  80 mg Oral Q12H  . tiotropium  1 capsule Inhalation Daily  . traZODone  150 mg Oral QHS  . venlafaxine XR  75 mg Oral Q breakfast    Vital Signs    Vitals:   12/11/20 2056 12/11/20 2100 12/11/20 2124 12/12/20 0554  BP: (!) 171/86   (!) 104/42  Pulse: (!) 55   (!) 45  Resp:    18  Temp:   97.6 F (36.4 C) (!) 97.5 F (36.4 C)  TempSrc:   Oral Oral  SpO2: 100%   100%  Weight:  98.3 kg  99.4 kg  Height:  5' 7"  (1.702 m)      Intake/Output Summary (Last 24 hours) at 12/12/2020 0828 Last data filed at 12/12/2020 0138 Gross per 24 hour  Intake 2000 ml  Output 850 ml  Net 1150 ml   [employer]  12/10/20 2010 12/11/20 2100 12/12/20 0554  Weight: 97.5 kg 98.3 kg 99.4 kg    Physical Exam    GEN: In no acute distress.  HEENT: normal.  Neck: Supple, no JVD, carotid bruits, or masses. Cardiac: Bradycardia no murmurs, rubs, or gallops. No clubbing, cyanosis, edema.  Radials/DP/PT 2+ and equal bilaterally.  Respiratory:  Respirations regular and unlabored, clear to auscultation bilaterally. GI: Soft, nontender, nondistended, BS + x 4. MS: no deformity or atrophy. Skin: warm and dry, no rash. Neuro:  Strength and sensation are intact. Psych: Normal affect.  Labs    CBC Recent Labs    12/10/20 1035 12/10/20 2018  WBC 8.3 7.6  NEUTROABS 5.6  --   HGB 19.9* 18.7*  HCT 58.8* 53.8*  MCV 94.5 94.4  PLT 193 639   Basic Metabolic Panel Recent Labs    12/10/20 2018  NA 141  K 3.7  CL  103  CO2 18*  GLUCOSE 180*  BUN 15  CREATININE 0.81  CALCIUM 9.3   Liver Function Tests No results for input(s): AST, ALT, ALKPHOS, BILITOT, PROT, ALBUMIN in the last 72 hours. No results for input(s): LIPASE, AMYLASE in the last 72 hours. Cardiac Enzymes No results for input(s): CKTOTAL, CKMB, CKMBINDEX, TROPONINI in the last 72 hours. BNP Recent Labs    12/11/20 0845  BNP 52.8   D-Dimer No results for input(s): DDIMER in the last 72 hours. Hemoglobin A1C No results for input(s): HGBA1C in the last 72 hours. Fasting Lipid Panel Recent Labs    12/12/20 0545  CHOL 110  HDL 23*  LDLCALC 42  TRIG 224*  CHOLHDL 4.8   Thyroid Function Tests No results for input(s): TSH, T4TOTAL, T3FREE, THYROIDAB in the last 72 hours.  Invalid input(s): FREET3  Telemetry    Sinus bradycardia.  No prolonged pauses.  No tachyarrhythmias noted. ECG    Sinus bradycardia no ischemia  Radiology    DG Chest 2 View  Result Date: 12/10/2020 CLINICAL DATA:  65 year old male with chest pain. EXAM: CHEST - 2 VIEW COMPARISON:  Chest radiograph dated 11/24/2020. FINDINGS: No focal consolidation, pleural effusion or pneumothorax. Mild cardiomegaly. Median sternotomy wires, CABG vascular clips, and coronary stent. No acute osseous pathology. IMPRESSION: No active cardiopulmonary disease. Electronically Signed   By: Anner Crete M.D.   On: 12/10/2020 20:35   DG Chest 2 View  Result Date: 11/24/2020 CLINICAL DATA:  Chest pain/pressure. EXAM: CHEST - 2 VIEW COMPARISON:  Radiograph and CT 10/06/2020 FINDINGS: Post median sternotomy and CABG. The heart is normal in size. Unchanged mediastinal contours. Chronic hyperinflation with diaphragmatic flattening. No pulmonary edema, focal airspace disease, pleural fluid or pneumothorax. Thoracic spondylosis. No acute osseous abnormalities are seen. IMPRESSION: 1. No acute chest findings. 2. Post CABG. Chronic hyperinflation. Electronically Signed   By: Keith Rake M.D.   On: 11/24/2020 03:48   CT Head Wo Contrast  Result Date: 12/11/2020 CLINICAL DATA:  Syncope, head injury, chronic anticoagulation EXAM: CT HEAD WITHOUT CONTRAST TECHNIQUE: Contiguous axial images were obtained from the base of the skull through the vertex without intravenous contrast. COMPARISON:  None. FINDINGS: Brain: Normal anatomic configuration. No abnormal intra or extra-axial mass lesion or fluid collection. No abnormal mass effect or midline shift. No evidence of acute intracranial hemorrhage or infarct. Ventricular size is normal. Cerebellum unremarkable. Vascular: Unremarkable Skull: Intact Sinuses/Orbits: Paranasal sinuses are clear. Orbits are unremarkable. Other: Mastoid air cells and middle ear cavities are clear. IMPRESSION: No acute intracranial injury.  No calvarial  fracture Electronically Signed   By: Fidela Salisbury MD   On: 12/11/2020 01:17   MR BRAIN WO CONTRAST  Result Date: 12/11/2020 CLINICAL DATA:  Syncope, simple, normal neuro exam EXAM: MRI HEAD WITHOUT CONTRAST TECHNIQUE: Multiplanar, multiecho pulse sequences of the brain and surrounding structures were obtained without intravenous contrast. COMPARISON:  12/11/2020 and prior. FINDINGS: Brain: No diffusion-weighted signal abnormality. Remote left occipital microhemorrhage. No midline shift, ventriculomegaly or extra-axial fluid collection. No mass lesion. Minimal chronic microvascular ischemic changes. Vascular: Normal flow voids. Skull and upper cervical spine: Normal marrow signal. Sinuses/Orbits: Normal orbits. Mild pansinus mucosal thickening. Small nasopharyngeal mucous retention cyst. No mastoid effusion. Other: Small right parietal scalp lipoma. IMPRESSION: No acute intracranial process. Remote left occipital microhemorrhage. Minimal chronic microvascular ischemic changes. Electronically Signed   By: Primitivo Gauze M.D.   On: 12/11/2020 12:36   CT Abdomen Pelvis W Contrast  Result Date:  11/24/2020 CLINICAL DATA:  Flank pain with kidney stone suspected. Left upper quadrant pain in the setting of polycythemia EXAM: CT ABDOMEN AND PELVIS WITH CONTRAST TECHNIQUE: Multidetector CT imaging of the abdomen and pelvis was performed using the standard protocol following bolus administration of intravenous contrast. CONTRAST:  152m OMNIPAQUE IOHEXOL 300 MG/ML  SOLN COMPARISON:  05/26/2018 FINDINGS: Lower chest:  No contributory findings. Hepatobiliary: Hepatic steatosis without focal abnormality.Cholecystectomy without bile duct dilatation. Pancreas: Unremarkable. Spleen: Unremarkable. Adrenals/Urinary Tract: Stable mild bilateral adrenal lobulation. No hydronephrosis or stone. 6 mm right interpolar renal lesion which is too small for accurate densitometry, size stable from 2019. Unremarkable bladder. Stomach/Bowel: No obstruction. No appendicitis. Outpouching at the gastric cardia. There is no history of fundoplication, most consistent with a small paraesophageal hernia. Mild distal colonic diverticulosis. Vascular/Lymphatic: No acute vascular abnormality. Atheromatous calcifications of the aorta and iliacs. No mass or adenopathy. Reproductive:No pathologic findings. Other: No ascites or pneumoperitoneum. Shallow, fatty left inguinal hernia. Musculoskeletal: No acute abnormalities. IMPRESSION: 1. No worrisome renal lesion. There is a 6 mm right renal nodule that is too small for characterization but size stable from 2019. 2. Hepatic steatosis and mild colonic diverticulosis. 3.  Aortic Atherosclerosis (ICD10-I70.0). 4. Small paraesophageal hiatal hernia. 5. Fatty left inguinal hernia. Electronically Signed   By: JMonte FantasiaM.D.   On: 11/24/2020 04:18    Assessment & Plan    65year old male with history of coronary artery disease status post PCI as well as CABG.  To most recent cath in 2020 and 2021 showed patent stents.  Now presents with chest pain is ruled out for a myocardial infarction.  Had  an apparent lightheaded episode that he states resulted in syncope although this was not witnessed.  1.  Chest pain-atypical chest pain worse with deep palpation or deep breathing.  EKG unchanged.  Has ruled out for myocardial infarction with high-sensitivity troponin of 13x2.  Pain appears to be nonischemic.  Would continue with medical management with Farxiga 10 mg daily, atorvastatin 80 mg daily, amlodipine 2.5 mg daily, apixaban 5 mg twice daily, Imdur 120 mg daily, lisinopril 5 mg daily, and Ranexa 500 mg twice daily.  Will discontinue metoprolol.  2.  Atrial fibrillation continue with sotalol but will decrease to 40 mg twice daily.  Metoprolol is been held.  We will continue to hold of this due to bradycardia.  No pauses.  Consideration for discharge on aforementioned medications with early outpatient follow-up in our office.  3.  Chest pain-atypical.  Avoid narcotics.  Signed, KJavier DockerFath MD 12/12/2020, 8:28 AM  Pager: [phone]  

## 2020-12-12 NOTE — Plan of Care (Signed)
Pt ready for discharge IV and tele removed.  Pt son notified of discharge to bring home O2 Discharge instructions reviewed with patient Time allowed for questions and concerns Denies any additional wants or needs at this time Awaiting transport home

## 2020-12-12 NOTE — TOC Transition Note (Signed)
Transition of Care [facility]) - CM/SW Discharge Note   Patient Details  Name: DEANO TOMASZEWSKI MRN: [id number] Date of Birth: [birth year]  Transition of Care [facility]) CM/SW Contact:  Kerin Salen, RN Phone Number: 12/12/2020, 9:51 AM   Clinical Narrative:  Spoke with patient in room, alert and oriented X3, states his son will bring his portable oxygen tank for discharge home. Uses [employer] services for Oxygen and has no other needs. No TOC barriers assessed at this time.         Patient Goals and CMS Choice        Discharge Placement                       Discharge Plan and Services                                     Social Determinants of Health (SDOH) Interventions     Readmission Risk Interventions No flowsheet data found.

## 2020-12-13 ENCOUNTER — Inpatient Hospital Stay: Payer: Medicare HMO

## 2020-12-16 ENCOUNTER — Ambulatory Visit (INDEPENDENT_AMBULATORY_CARE_PROVIDER_SITE_OTHER): Payer: Medicare HMO | Admitting: Family Medicine

## 2020-12-16 ENCOUNTER — Encounter: Payer: Self-pay | Admitting: Family Medicine

## 2020-12-16 ENCOUNTER — Other Ambulatory Visit: Payer: Self-pay

## 2020-12-16 VITALS — BP 109/72 | HR 72 | Temp 97.8°F | Resp 20 | Ht 67.0 in | Wt 217.0 lb

## 2020-12-16 DIAGNOSIS — G8929 Other chronic pain: Secondary | ICD-10-CM | POA: Diagnosis not present

## 2020-12-16 DIAGNOSIS — E1121 Type 2 diabetes mellitus with diabetic nephropathy: Secondary | ICD-10-CM

## 2020-12-16 DIAGNOSIS — I700 Atherosclerosis of aorta: Secondary | ICD-10-CM | POA: Insufficient documentation

## 2020-12-16 DIAGNOSIS — M545 Low back pain, unspecified: Secondary | ICD-10-CM | POA: Diagnosis not present

## 2020-12-16 DIAGNOSIS — J9611 Chronic respiratory failure with hypoxia: Secondary | ICD-10-CM

## 2020-12-16 DIAGNOSIS — R110 Nausea: Secondary | ICD-10-CM

## 2020-12-16 DIAGNOSIS — F419 Anxiety disorder, unspecified: Secondary | ICD-10-CM | POA: Diagnosis not present

## 2020-12-16 DIAGNOSIS — I5022 Chronic systolic (congestive) heart failure: Secondary | ICD-10-CM

## 2020-12-16 MED ORDER — TIZANIDINE HCL 4 MG PO TABS: 4.0000 mg | ORAL_TABLET | Freq: Four times a day (QID) | ORAL | 0 refills | Status: DC | PRN

## 2020-12-16 MED ORDER — PROMETHAZINE HCL 25 MG PO TABS: 25.0000 mg | ORAL_TABLET | Freq: Three times a day (TID) | ORAL | 0 refills | Status: DC | PRN

## 2020-12-16 MED ORDER — ALPRAZOLAM 1 MG PO TABS: 1.0000 mg | ORAL_TABLET | Freq: Three times a day (TID) | ORAL | 3 refills | Status: DC

## 2020-12-16 NOTE — Patient Instructions (Addendum)
.   Please review the attached list of medications and notify my office if there are any errors.   . Please contact your eyecare professional to schedule a routine eye exam

## 2020-12-16 NOTE — Progress Notes (Signed)
Established patient visit   Patient: Lawrence [name]   DOB: 27-Jan-1956   65 y.o. Male  MRN: [id number] Visit Date: 12/16/2020  Today's healthcare provider: [name], MD   Chief Complaint  Patient presents with  . Diabetes  . Hospitalization Follow-up   Subjective    HPI  Diabetes Mellitus Type II, Follow-up  Lab Results  Component Value Date   HGBA1C 7.5 (H) 12/12/2020   HGBA1C 9.6 (H) 09/24/2020   HGBA1C 7.5 (A) 05/13/2020   Wt Readings from Last 3 Encounters:  12/16/20 217 lb (98.4 kg)  12/12/20 219 lb 2.2 oz (99.4 kg)  12/10/20 215 lb (97.5 kg)   Last seen for diabetes 1 month ago.  Management since then includes doubling the dose of Metformin to twice daily. He reports good compliance with treatment. He is not having side effects.  Symptoms: Yes fatigue No foot ulcerations  No appetite changes No nausea  No paresthesia of the feet  No polydipsia  No polyuria No visual disturbances   No vomiting     Home blood sugar records: trend: stable  Episodes of hypoglycemia? No    Current insulin regiment: none Most Recent Eye Exam: >1 year ago Current exercise: no regular exercise Current diet habits: well balanced  Pertinent Labs: Lab Results  Component Value Date   CHOL 110 12/12/2020   HDL 23 (L) 12/12/2020   LDLCALC 42 12/12/2020   LDLDIRECT 122.1 (H) 09/27/2020   TRIG 224 (H) 12/12/2020   CHOLHDL 4.8 12/12/2020   Lab Results  Component Value Date   NA 141 12/10/2020   K 4.1 12/12/2020   CREATININE 0.81 12/10/2020   GFRNONAA >60 12/10/2020   GFRAA 97 09/24/2020   GLUCOSE 180 (H) 12/10/2020      Follow up Hospitalization  Patient was admitted to [facility] on 12/11/2020 and discharged on 12/12/2020. He was treated for unstable angina, syncope and acidosis. Treatment for this included discontinuation of metoprolol and decreasing his home dose of sotalol from 80 mg twice daily to 40 mg twice daily per cardiology.   He reports good  compliance with treatment. He reports this condition is stayed the same. Patient reports that he is still having occasional chest pain.   BP Readings from Last 3 Encounters:  12/16/20 109/72  12/12/20 134/77  12/10/20 (!) 143/87     Medications: Outpatient Medications Prior to Visit  Medication Sig  . albuterol (VENTOLIN HFA) 108 (90 Base) MCG/ACT inhaler INHALE 2 PUFFS BY MOUTH EVERY 6 HOURS AS NEEDED FOR SHORTNESS OF BREATH (Patient taking differently: Inhale 2 puffs into the lungs every 6 (six) hours as needed for wheezing or shortness of breath.)  . allopurinol (ZYLOPRIM) 300 MG tablet TAKE 1 TABLET BY MOUTH TWICE A DAY (Patient taking differently: Take 300 mg by mouth 2 (two) times daily.)  . ALPRAZolam (XANAX) 1 MG tablet TAKE ONE TABLET BY MOUTH THREE TIMES A DAY  . amLODipine (NORVASC) 2.5 MG tablet Take 1 tablet (2.5 mg total) by mouth daily.  Marland Kitchen aspirin 81 MG chewable tablet Chew 81 mg by mouth daily. Swallows whole  . atorvastatin (LIPITOR) 80 MG tablet TAKE ONE TABLET BY MOUTH AT BEDTIME (Patient taking differently: Take 80 mg by mouth at bedtime.)  . BREO ELLIPTA 100-25 MCG/INH AEPB INHALE 1 PUFF INTO THE LUNGS DAILY  . budesonide (PULMICORT) 0.5 MG/2ML nebulizer solution Take 0.5 mg by nebulization 2 (two) times daily as needed (shortness of breath or wheezing).   . cetirizine (  ZYRTEC) 10 MG tablet TAKE ONE TABLET BY MOUTH AT BEDTIME  . docusate sodium (COLACE) 100 MG capsule Take 100 mg by mouth daily as needed for mild constipation.  Marland Kitchen ELIQUIS 5 MG TABS tablet TAKE 1 TABLET BY MOUTH TWICE A DAY  . FARXIGA 10 MG TABS tablet TAKE 1 TABLET BY MOUTH DAILY (Patient taking differently: Take 10 mg by mouth daily.)  . LINZESS 72 MCG capsule Take 72 mcg by mouth daily before breakfast.   . lisinopril (ZESTRIL) 10 MG tablet TAKE 1 TABLET BY MOUTH DAILY (Patient taking differently: Take 10 mg by mouth daily.)  . magnesium oxide (MAG-OX) 400 (241.3 Mg) MG tablet Take 1 tablet (400 mg  total) by mouth every morning.  . metaxalone (SKELAXIN) 800 MG tablet TAKE 1 TABLET BY MOUTH 3 TIMES A DAY AS NEEDED FOR PAIN (Patient taking differently: Take 400 mg by mouth 3 (three) times daily.)  . metFORMIN (GLUCOPHAGE-XR) 500 MG 24 hr tablet Take 500 mg by mouth 2 (two) times daily.  . methocarbamol (ROBAXIN) 500 MG tablet Take by mouth.  . naloxone (NARCAN) nasal spray 4 mg/0.1 mL 1 spray into nostril x1 and may repeat every 2-3 minutes until patient is responsive or EMS arrives  . nitroGLYCERIN (NITROSTAT) 0.4 MG SL tablet PLACE ONE TABLET UNDER THE TONGUE EVERY 5 MINUTES FOR CHEST PAIN. IF NO RELIEF PAST 3RD TAB GO TO EMERGENCY ROOM (Patient taking differently: Place 0.4 mg under the tongue every 5 (five) minutes as needed for chest pain.)  . omega-3 acid ethyl esters (LOVAZA) 1 g capsule TAKE 4 CAPSULES BY MOUTH DAILY (Patient taking differently: Take 4 g by mouth daily.)  . omeprazole (PRILOSEC) 40 MG capsule Take 40 mg by mouth daily.   Marland Kitchen oxyCODONE-acetaminophen (PERCOCET) 10-325 MG tablet TAKE 1 TABLET BY MOUTH EVERY 4 HOURS AS NEEDED FOR PAIN  . ranolazine (RANEXA) 1000 MG SR tablet TAKE 1 TABLET BY MOUTH TWICE A DAY (Patient taking differently: Take 1,000 mg by mouth 2 (two) times daily.)  . sotalol (BETAPACE) 80 MG tablet Take 40 mg by mouth 2 (two) times daily.  Marland Kitchen SPIRIVA HANDIHALER 18 MCG inhalation capsule PLACE 1 CAPSULE INTO INHALER AND INHALE BY MOUTH ONCE A DAY. (Patient taking differently: Place 1 capsule into inhaler and inhale daily.)  . torsemide (DEMADEX) 100 MG tablet Take 0.5 tablets (50 mg total) by mouth daily.  . traZODone (DESYREL) 150 MG tablet TAKE ONE TABLET BY MOUTH AT BEDTIME  . venlafaxine XR (EFFEXOR-XR) 75 MG 24 hr capsule TAKE 1 CAPSULE BY MOUTH DAILY WITH BREAKFAST (Patient taking differently: Take 75 mg by mouth daily with breakfast.)  . isosorbide mononitrate (IMDUR) 120 MG 24 hr tablet TAKE 1 TABLET BY MOUTH DAILY   No facility-administered medications  prior to visit.    Review of Systems    Objective    BP 109/72   Pulse 72   Temp 97.8 F (36.6 C)   Resp 20   Ht 5' 7"  (1.702 m)   Wt 217 lb (98.4 kg)   BMI 33.99 kg/m    Physical Exam    General: Appearance:    Obese male in no acute distress  Eyes:    PERRL, conjunctiva/corneas clear, EOM's intact       Lungs:     Clear to auscultation bilaterally, respirations unlabored  Heart:    Normal heart rate. Regular rhythm. No murmurs, rubs, or gallops.   MS:   All extremities are intact.   Neurologic:  Awake, alert, oriented x 3. No apparent focal neurological           defect.         No results found for any visits on 12/16/20.  Assessment & Plan     1. Type 2 diabetes mellitus with diabetic nephropathy, without long-term current use of insulin (HCC) Lat a1c was 7.5 on 12/12/2020. Continue current medications.   - AMB Referral to [facility]  2. Chronic respiratory failure with hypoxia (HCC)  - AMB Referral to [facility]  3. Atherosclerosis of aorta (HCC)  - AMB Referral to [facility]  4. Chronic systolic CHF (congestive heart failure) (HCC)  - AMB Referral to [facility]  5. Nausea refill - promethazine (PHENERGAN) 25 MG tablet; Take 1 tablet (25 mg total) by mouth every 8 (eight) hours as needed for nausea or vomiting.  Dispense: 20 tablet; Refill: 0  6. Acute anxiety refill- ALPRAZolam (XANAX) 1 MG tablet; Take 1 tablet (1 mg total) by mouth 3 (three) times daily.  Dispense: 90 tablet; Refill: 3  7. Chronic bilateral low back pain without sciatica refill- tiZANidine (ZANAFLEX) 4 MG tablet; Take 1 tablet (4 mg total) by mouth every 6 (six) hours as needed for muscle spasms.  Dispense: 30 tablet; Refill: 0   Follow up 3 months.        The entirety of the information documented in the History of Present Illness, Review of Systems and Physical Exam were personally obtained by me. Portions of this  information were initially documented by the CMA and reviewed by me for thoroughness and accuracy.      [name], MD  [facility] [phone] (phone) [phone] (fax)  [place]

## 2020-12-17 ENCOUNTER — Telehealth: Payer: Self-pay | Admitting: *Deleted

## 2020-12-17 NOTE — Chronic Care Management (AMB) (Signed)
  Chronic Care Management   Note  12/17/2020 Name: KEROLOS NEHME MRN: [id number] DOB: 02-Dec-1955   GROSSER is a 65 y.o. year old male who is a primary care patient of [name], [name], MD. I reached out to [name] by phone today in response to a referral sent by Mr. Gwenyth Bouillon PCP, Birdie Sons, MD.    Mr. Herder was given information about Chronic Care Management services today including:  1. CCM service includes personalized support from designated clinical staff supervised by his physician, including individualized plan of care and coordination with other care providers 2. 24/7 contact phone numbers for assistance for urgent and routine care needs. 3. Service will only be billed when office clinical staff spend 20 minutes or more in a month to coordinate care. 4. Only one practitioner may furnish and bill the service in a calendar month. 5. The patient may stop CCM services at any time (effective at the end of the month) by phone call to the office staff. 6. The patient will be responsible for cost sharing (co-pay) of up to 20% of the service fee (after annual deductible is met).  Patient agreed to services and verbal consent obtained.   Follow up plan: Telephone appointment with care management team member scheduled for:01/09/2021  [facility]

## 2020-12-24 ENCOUNTER — Inpatient Hospital Stay: Payer: Medicare HMO | Attending: Oncology

## 2020-12-24 DIAGNOSIS — E119 Type 2 diabetes mellitus without complications: Secondary | ICD-10-CM | POA: Diagnosis not present

## 2020-12-24 DIAGNOSIS — Z8249 Family history of ischemic heart disease and other diseases of the circulatory system: Secondary | ICD-10-CM | POA: Insufficient documentation

## 2020-12-24 DIAGNOSIS — Z85828 Personal history of other malignant neoplasm of skin: Secondary | ICD-10-CM | POA: Insufficient documentation

## 2020-12-24 DIAGNOSIS — D751 Secondary polycythemia: Secondary | ICD-10-CM | POA: Diagnosis not present

## 2020-12-24 DIAGNOSIS — R531 Weakness: Secondary | ICD-10-CM | POA: Insufficient documentation

## 2020-12-24 DIAGNOSIS — Z833 Family history of diabetes mellitus: Secondary | ICD-10-CM | POA: Insufficient documentation

## 2020-12-24 DIAGNOSIS — I5032 Chronic diastolic (congestive) heart failure: Secondary | ICD-10-CM | POA: Insufficient documentation

## 2020-12-24 DIAGNOSIS — Z86010 Personal history of colonic polyps: Secondary | ICD-10-CM | POA: Diagnosis not present

## 2020-12-24 DIAGNOSIS — D170 Benign lipomatous neoplasm of skin and subcutaneous tissue of head, face and neck: Secondary | ICD-10-CM | POA: Insufficient documentation

## 2020-12-24 DIAGNOSIS — R079 Chest pain, unspecified: Secondary | ICD-10-CM | POA: Insufficient documentation

## 2020-12-24 DIAGNOSIS — F419 Anxiety disorder, unspecified: Secondary | ICD-10-CM | POA: Insufficient documentation

## 2020-12-24 DIAGNOSIS — I252 Old myocardial infarction: Secondary | ICD-10-CM | POA: Insufficient documentation

## 2020-12-24 DIAGNOSIS — K219 Gastro-esophageal reflux disease without esophagitis: Secondary | ICD-10-CM | POA: Diagnosis not present

## 2020-12-24 DIAGNOSIS — I2510 Atherosclerotic heart disease of native coronary artery without angina pectoris: Secondary | ICD-10-CM | POA: Diagnosis not present

## 2020-12-24 DIAGNOSIS — I110 Hypertensive heart disease with heart failure: Secondary | ICD-10-CM | POA: Diagnosis not present

## 2020-12-24 DIAGNOSIS — Z7901 Long term (current) use of anticoagulants: Secondary | ICD-10-CM | POA: Insufficient documentation

## 2020-12-24 DIAGNOSIS — Z818 Family history of other mental and behavioral disorders: Secondary | ICD-10-CM | POA: Insufficient documentation

## 2020-12-24 DIAGNOSIS — Z836 Family history of other diseases of the respiratory system: Secondary | ICD-10-CM | POA: Insufficient documentation

## 2020-12-24 DIAGNOSIS — Z79899 Other long term (current) drug therapy: Secondary | ICD-10-CM | POA: Diagnosis not present

## 2020-12-24 DIAGNOSIS — S0990XA Unspecified injury of head, initial encounter: Secondary | ICD-10-CM | POA: Insufficient documentation

## 2020-12-24 DIAGNOSIS — I6782 Cerebral ischemia: Secondary | ICD-10-CM | POA: Insufficient documentation

## 2020-12-24 DIAGNOSIS — Z87442 Personal history of urinary calculi: Secondary | ICD-10-CM | POA: Insufficient documentation

## 2020-12-24 DIAGNOSIS — Z87891 Personal history of nicotine dependence: Secondary | ICD-10-CM | POA: Diagnosis not present

## 2020-12-24 DIAGNOSIS — Z9049 Acquired absence of other specified parts of digestive tract: Secondary | ICD-10-CM | POA: Diagnosis not present

## 2020-12-24 DIAGNOSIS — Z882 Allergy status to sulfonamides status: Secondary | ICD-10-CM | POA: Insufficient documentation

## 2020-12-24 DIAGNOSIS — Z8379 Family history of other diseases of the digestive system: Secondary | ICD-10-CM | POA: Insufficient documentation

## 2020-12-24 DIAGNOSIS — Z888 Allergy status to other drugs, medicaments and biological substances status: Secondary | ICD-10-CM | POA: Diagnosis not present

## 2020-12-24 DIAGNOSIS — Z885 Allergy status to narcotic agent status: Secondary | ICD-10-CM | POA: Insufficient documentation

## 2020-12-24 LAB — CBC WITH DIFFERENTIAL/PLATELET
Abs Immature Granulocytes: 0.02 10*3/uL (ref 0.00–0.07)
Basophils Absolute: 0 10*3/uL (ref 0.0–0.1)
Basophils Relative: 1 %
Eosinophils Absolute: 0.2 10*3/uL (ref 0.0–0.5)
Eosinophils Relative: 3 %
HCT: 51.7 % (ref 39.0–52.0)
Hemoglobin: 17.3 g/dL — ABNORMAL HIGH (ref 13.0–17.0)
Immature Granulocytes: 0 %
Lymphocytes Relative: 26 %
Lymphs Abs: 1.5 10*3/uL (ref 0.7–4.0)
MCH: 31.6 pg (ref 26.0–34.0)
MCHC: 33.5 g/dL (ref 30.0–36.0)
MCV: 94.5 fL (ref 80.0–100.0)
Monocytes Absolute: 0.4 10*3/uL (ref 0.1–1.0)
Monocytes Relative: 6 %
Neutro Abs: 3.6 10*3/uL (ref 1.7–7.7)
Neutrophils Relative %: 64 %
Platelets: 174 10*3/uL (ref 150–400)
RBC: 5.47 MIL/uL (ref 4.22–5.81)
RDW: 12.5 % (ref 11.5–15.5)
WBC: 5.7 10*3/uL (ref 4.0–10.5)
nRBC: 0 % (ref 0.0–0.2)

## 2020-12-24 LAB — IRON AND TIBC
Iron: 68 ug/dL (ref 45–182)
Saturation Ratios: 20 % (ref 17.9–39.5)
TIBC: 336 ug/dL (ref 250–450)
UIBC: 268 ug/dL

## 2020-12-24 LAB — FERRITIN: Ferritin: 31 ng/mL (ref 24–336)

## 2020-12-25 LAB — ERYTHROPOIETIN: Erythropoietin: 21.8 m[IU]/mL — ABNORMAL HIGH (ref 2.6–18.5)

## 2020-12-28 LAB — COMP PANEL: LEUKEMIA/LYMPHOMA

## 2020-12-30 ENCOUNTER — Other Ambulatory Visit: Payer: Self-pay | Admitting: Family Medicine

## 2020-12-30 DIAGNOSIS — G8929 Other chronic pain: Secondary | ICD-10-CM

## 2020-12-30 MED ORDER — OXYCODONE-ACETAMINOPHEN 10-325 MG PO TABS: 1.0000 | ORAL_TABLET | ORAL | 0 refills | Status: DC | PRN

## 2020-12-30 NOTE — Telephone Encounter (Signed)
Medication Refill - Medication: oxyCODONE-acetaminophen (PERCOCET) 10-325 MG tablet  Pt is out of medication   Has the patient contacted their pharmacy? No. (Agent: If no, request that the patient contact the pharmacy for the refill.) (Agent: If yes, when and what did the pharmacy advise?)  Preferred Pharmacy (with phone number or street name): [facility]

## 2020-12-30 NOTE — Telephone Encounter (Signed)
Requested medication (s) are due for refill today: yes  Requested medication (s) are on the active medication list: yes  Last refill:  12/03/20  Future visit scheduled: yes  Notes to clinic: not delegated    Requested Prescriptions  Pending Prescriptions Disp Refills   oxyCODONE-acetaminophen (PERCOCET) 10-325 MG tablet 150 tablet 0    Sig: Take 1 tablet by mouth every 4 (four) hours as needed. for pain      Not Delegated - Analgesics:  Opioid Agonist Combinations Failed - 12/30/2020  8:26 AM      Failed - This refill cannot be delegated      Passed - Urine Drug Screen completed in last 360 days      Passed - Valid encounter within last 6 months    Recent Outpatient Visits           2 weeks ago Type 2 diabetes mellitus with diabetic nephropathy, without long-term current use of insulin ([place])   [facility] Birdie Sons, MD   2 months ago Chest wall pain   [facility] Birdie Sons, MD   3 months ago Type 2 diabetes mellitus with diabetic nephropathy, without long-term current use of insulin [facility])   [facility] Birdie Sons, MD   7 months ago Type 2 diabetes mellitus with diabetic nephropathy, without long-term current use of insulin [facility])   [facility] Birdie Sons, MD   9 months ago Insomnia, unspecified type   [facility] Birdie Sons, MD       Future Appointments             In 2 months [name], [name], MD [facility] [place]

## 2020-12-31 ENCOUNTER — Inpatient Hospital Stay: Payer: Medicare HMO | Admitting: Oncology

## 2020-12-31 ENCOUNTER — Inpatient Hospital Stay: Payer: Medicare HMO

## 2021-01-02 LAB — JAK2 EXONS 12-15

## 2021-01-03 ENCOUNTER — Inpatient Hospital Stay (HOSPITAL_BASED_OUTPATIENT_CLINIC_OR_DEPARTMENT_OTHER): Payer: Medicare HMO | Admitting: Oncology

## 2021-01-03 ENCOUNTER — Inpatient Hospital Stay: Payer: Medicare HMO

## 2021-01-03 ENCOUNTER — Other Ambulatory Visit: Payer: Self-pay

## 2021-01-03 VITALS — BP 129/77 | HR 72 | Temp 97.6°F | Resp 18 | Wt 223.5 lb

## 2021-01-03 DIAGNOSIS — I2510 Atherosclerotic heart disease of native coronary artery without angina pectoris: Secondary | ICD-10-CM | POA: Diagnosis not present

## 2021-01-03 DIAGNOSIS — I5032 Chronic diastolic (congestive) heart failure: Secondary | ICD-10-CM | POA: Diagnosis not present

## 2021-01-03 DIAGNOSIS — R079 Chest pain, unspecified: Secondary | ICD-10-CM | POA: Diagnosis not present

## 2021-01-03 DIAGNOSIS — F419 Anxiety disorder, unspecified: Secondary | ICD-10-CM | POA: Diagnosis not present

## 2021-01-03 DIAGNOSIS — I252 Old myocardial infarction: Secondary | ICD-10-CM | POA: Diagnosis not present

## 2021-01-03 DIAGNOSIS — D751 Secondary polycythemia: Secondary | ICD-10-CM

## 2021-01-03 DIAGNOSIS — E119 Type 2 diabetes mellitus without complications: Secondary | ICD-10-CM | POA: Diagnosis not present

## 2021-01-03 DIAGNOSIS — R531 Weakness: Secondary | ICD-10-CM | POA: Diagnosis not present

## 2021-01-03 DIAGNOSIS — I110 Hypertensive heart disease with heart failure: Secondary | ICD-10-CM | POA: Diagnosis not present

## 2021-01-03 NOTE — Progress Notes (Signed)
Pt states he feels tired and run down. Appetite is good. Here today for possible phlebotomy.

## 2021-01-03 NOTE — Progress Notes (Signed)
No phlebotomy today per Faythe Casa NP.

## 2021-01-03 NOTE — Progress Notes (Signed)
[place]  [phone]) [phone] [phone]) [phone]  ID: Lawrence [name] OB: [birth year]  MR#: [id number]  CBU#:[id number]  Patient Care Team: Birdie Sons, MD as PCP - General (Family Medicine) Allyne Gee, MD as Consulting Physician (Pulmonary Disease) Marisa Hua, PA-C as Physician Assistant Anell Barr, OD as Consulting Physician (Optometry) Ubaldo Glassing Javier Docker, MD as Consulting Physician (Cardiology) Lloyd Huger, MD as Consulting Physician (Oncology) Neldon Labella, RN as Registered Nurse  CHIEF COMPLAINT: Polycythemia.  INTERVAL HISTORY: Patient returns to clinic today for repeat laboratory work, discussion of his imaging results, and consideration of phlebotomy.   He was scheduled to have a phlebotomy on 12/10/2020 but unfortunately had car trouble and was unable to make it.  He was hospitalized from 10/06/20-10/08/20 for chest pain.  Work-up was negative he was discharged with close cardiology follow-up.  He was seen again on 11/24/2020 for same complaints and work-up negative.  He was admitted from 12/11/2020-12/12/2020 for near syncope.  He was hydrated and sent home.  Today, he feels "fine".  States he has intermittent days where he feels fatigued but otherwise stable.  He denies any hyperviscosity symptoms including headaches, seizure-like activity or changes in his vision. He denies any recent fevers or illnesses.  He has a good appetite and denies weight loss.  He has no chest pain, shortness of breath, cough, or hemoptysis.  He denies any nausea, vomiting, constipation, or diarrhea.  He has no urinary complaints.  Patient feels at his baseline offers no specific complaints today.  REVIEW OF SYSTEMS:   Review of Systems  Constitutional: Positive for malaise/fatigue. Negative for fever and weight loss.  Respiratory: Negative.  Negative for cough, hemoptysis and shortness of breath.   Cardiovascular: Negative.  Negative for chest pain  and leg swelling.  Gastrointestinal: Negative.  Negative for abdominal pain.  Genitourinary: Negative.  Negative for dysuria.  Musculoskeletal: Negative.  Negative for back pain.  Skin: Negative.  Negative for rash.  Neurological: Positive for weakness. Negative for dizziness, focal weakness and headaches.  Psychiatric/Behavioral: Negative.  The patient is not nervous/anxious.     As per HPI. Otherwise, a complete review of systems is negative.  PAST MEDICAL HISTORY: Past Medical History:  Diagnosis Date  . A-fib ([place])   . Anemia   . Anginal pain ([place])   . Anxiety   . Arthritis   . Asthma   . CAD (coronary artery disease)    a. 2002 CABGx2 (LIMA->LAD, VG->VG->OM1);  b. 09/2012 DES->OM;  c. 03/2015 PTCA of LAD [facility]) in setting of atretic LIMA; d. 05/2015 Cath [facility]): nonobs dzs; e. 06/2015 Cath ([facility]): LM nl, LAD 45p/d ISR, 50d, D1/2 small, LCX 50p/d ISR, OM1 70ost, 30 ISR, VG->OM1 50ost, 55m LIMA->LAD 99p/d - atretic, RCA dom, nl; f.cath 10/16: 40-50%(FFR 0.90) pLAD, 75% (FFR 0.77) mLAD s/p PCI/DES, oRCA 40% (FFR0.95)  . Cancer ([place]    SKIN CANCER ON BACK  . Celiac disease   . Chronic diastolic CHF (congestive heart failure) ([place]    a. 06/2009 Echo: EF 60-65%, Gr 1 DD, triv AI, mildly dil LA, nl RV.  .Marland KitchenCOPD (chronic obstructive pulmonary disease) ([place]    a. Chronic bronchitis and emphysema.  . DDD (degenerative disc disease), lumbar   . Diverticulosis   . Dysrhythmia   . Essential hypertension   . GERD (gastroesophageal reflux disease)   . History of hiatal hernia   . History of kidney stones    H/O  . History of  tobacco abuse    a. Quit 2014.  Marland Kitchen Myocardial infarction ([place]) 2002   4 STENTS  . Pancreatitis   . PSVT (paroxysmal supraventricular tachycardia) ([place])    a. 10/2012 Noted on Zio Patch.  . Sleep apnea    LOST CORD TO CPAP -ONLY 02 @ BEDTIME  . Tubular adenoma of colon   . Type II diabetes mellitus ([place])     PAST SURGICAL HISTORY: Past Surgical History:   Procedure Laterality Date  . BYPASS GRAFT    . CARDIAC CATHETERIZATION N/A 07/12/2015   rocedure: Left Heart Cath and Cors/Grafts Angiography;  Surgeon: Belva Crome, MD;  Location: [place] CV LAB;  Service: Cardiovascular;  Laterality: N/A;  . CARDIAC CATHETERIZATION Right 10/07/2015   Procedure: Left Heart Cath and Cors/Grafts Angiography;  Surgeon: Dionisio David, MD;  Location: [place] [facility] LAB;  Service: Cardiovascular;  Laterality: Right;  . CARDIAC CATHETERIZATION N/A 04/06/2016   Procedure: Left Heart Cath and Coronary Angiography;  Surgeon: Yolonda Kida, MD;  Location: [place] [facility] LAB;  Service: Cardiovascular;  Laterality: N/A;  . CARDIAC CATHETERIZATION  04/06/2016   Procedure: Bypass Graft Angiography;  Surgeon: Yolonda Kida, MD;  Location: [place] CV LAB;  Service: Cardiovascular;;  . CARDIAC CATHETERIZATION N/A 11/02/2016   Procedure: Left Heart Cath and Cors/Grafts Angiography and possible PCI;  Surgeon: Yolonda Kida, MD;  Location: [place] [facility] LAB;  Service: Cardiovascular;  Laterality: N/A;  . CARDIAC CATHETERIZATION N/A 11/02/2016   Procedure: Coronary Stent Intervention;  Surgeon: Yolonda Kida, MD;  Location: [place] [facility] LAB;  Service: Cardiovascular;  Laterality: N/A;  . CHOLECYSTECTOMY    . CIRCUMCISION N/A 06/09/2019   Procedure: CIRCUMCISION ADULT;  Surgeon: Billey Co, MD;  Location: [facility] ORS;  Service: Urology;  Laterality: N/A;  . COLONOSCOPY WITH PROPOFOL N/A 04/01/2018   Procedure: COLONOSCOPY WITH PROPOFOL;  Surgeon: Manya Silvas, MD;  Location: [facility] ENDOSCOPY;  Service: Endoscopy;  Laterality: N/A;  . ESOPHAGEAL DILATION    . ESOPHAGOGASTRODUODENOSCOPY (EGD) WITH PROPOFOL N/A 04/01/2018   Procedure: ESOPHAGOGASTRODUODENOSCOPY (EGD) WITH PROPOFOL;  Surgeon: Manya Silvas, MD;  Location[facility];  Service: Endoscopy;  Laterality: N/A;  . LEFT HEART CATH AND CORS/GRAFTS ANGIOGRAPHY N/A 06/12/2019    Procedure: LEFT HEART CATH AND CORS/GRAFTS ANGIOGRAPHY;  Surgeon: Teodoro Spray, MD;  Location: [place] CV LAB;  Service: Cardiovascular;  Laterality: N/A;  . LEFT HEART CATH AND CORS/GRAFTS ANGIOGRAPHY N/A 03/11/2020   Procedure: LEFT HEART CATH AND CORS/GRAFTS ANGIOGRAPHY;  Surgeon: Isaias Cowman, MD;  Location: [place] CV LAB;  Service: Cardiovascular;  Laterality: N/A;  . TONSILLECTOMY    . VASCULAR SURGERY      FAMILY HISTORY: Family History  Problem Relation Age of Onset  . Heart attack Mother   . Depression Mother   . Heart disease Mother   . COPD Mother   . Hypertension Mother   . Heart attack Father   . Diabetes Father   . Depression Father   . Heart disease Father   . Cirrhosis Father   . Parkinson's disease Brother     ADVANCED DIRECTIVES (Y/N):  N  HEALTH MAINTENANCE: Social History   Tobacco Use  . Smoking status: Former Smoker    Packs/day: 3.00    Years: 50.00    Pack years: 150.00    Types: Cigarettes    Quit date: 04/22/2013    Years since quitting: 7.7  . Smokeless tobacco: Never Used  Vaping  Use  . Vaping Use: Never used  Substance Use Topics  . Alcohol use: No    Comment: remotely quit alcohol use. Hx of heavy alcohol use.  . Drug use: Yes    Types: Marijuana    Comment: occasionally     Colonoscopy:  PAP:  Bone density:  Lipid panel:  Allergies  Allergen Reactions  . Demerol  [Meperidine Hcl]   . Demerol [Meperidine] Hives  . Jardiance [Empagliflozin] Other (See Comments)    Perineal pain  . Prednisone Other (See Comments) and Hypertension    Pt states that this medication puts him in A-fib   . Sulfa Antibiotics Hives  . Albuterol Sulfate [Albuterol] Palpitations and Other (See Comments)    Pt currently uses this medication.    . Morphine Sulfate Nausea And Vomiting, Rash and Other (See Comments)    Pt states that he is only allergic to the tablet form of this medication.      Current Outpatient Medications   Medication Sig Dispense Refill  . albuterol (VENTOLIN HFA) 108 (90 Base) MCG/ACT inhaler INHALE 2 PUFFS BY MOUTH EVERY 6 HOURS AS NEEDED FOR SHORTNESS OF BREATH 18 g 5  . allopurinol (ZYLOPRIM) 300 MG tablet TAKE 1 TABLET BY MOUTH TWICE A DAY 180 tablet 0  . ALPRAZolam (XANAX) 1 MG tablet Take 1 tablet (1 mg total) by mouth 3 (three) times daily. 90 tablet 3  . amLODipine (NORVASC) 2.5 MG tablet Take 1 tablet (2.5 mg total) by mouth daily. 90 tablet 3  . aspirin 81 MG chewable tablet Chew 81 mg by mouth daily. Swallows whole    . atorvastatin (LIPITOR) 80 MG tablet TAKE ONE TABLET BY MOUTH AT BEDTIME 90 tablet 1  . BREO ELLIPTA 100-25 MCG/INH AEPB INHALE 1 PUFF INTO THE LUNGS DAILY 60 each 2  . budesonide (PULMICORT) 0.5 MG/2ML nebulizer solution Take 0.5 mg by nebulization 2 (two) times daily as needed (shortness of breath or wheezing).     . cetirizine (ZYRTEC) 10 MG tablet TAKE ONE TABLET BY MOUTH AT BEDTIME 90 tablet 1  . docusate sodium (COLACE) 100 MG capsule Take 100 mg by mouth daily as needed for mild constipation.    Marland Kitchen ELIQUIS 5 MG TABS tablet TAKE 1 TABLET BY MOUTH TWICE A DAY 180 tablet 3  . FARXIGA 10 MG TABS tablet TAKE 1 TABLET BY MOUTH DAILY (Patient taking differently: Take 10 mg by mouth daily.) 30 tablet 12  . isosorbide mononitrate (IMDUR) 120 MG 24 hr tablet TAKE 1 TABLET BY MOUTH DAILY 90 tablet 0  . LINZESS 72 MCG capsule Take 72 mcg by mouth daily before breakfast.     . lisinopril (ZESTRIL) 10 MG tablet TAKE 1 TABLET BY MOUTH DAILY 90 tablet 4  . magnesium oxide (MAG-OX) 400 (241.3 Mg) MG tablet Take 1 tablet (400 mg total) by mouth every morning. 30 tablet 5  . metFORMIN (GLUCOPHAGE-XR) 500 MG 24 hr tablet Take 500 mg by mouth 2 (two) times daily.    . naloxone (NARCAN) nasal spray 4 mg/0.1 mL 1 spray into nostril x1 and may repeat every 2-3 minutes until patient is responsive or EMS arrives 2 each 2  . nitroGLYCERIN (NITROSTAT) 0.4 MG SL tablet PLACE ONE TABLET UNDER  THE TONGUE EVERY 5 MINUTES FOR CHEST PAIN. IF NO RELIEF PAST 3RD TAB GO TO EMERGENCY ROOM (Patient taking differently: Place 0.4 mg under the tongue every 5 (five) minutes as needed for chest pain.) 30 tablet 3  . omega-3  acid ethyl esters (LOVAZA) 1 g capsule TAKE 4 CAPSULES BY MOUTH DAILY 120 capsule 12  . omeprazole (PRILOSEC) 40 MG capsule Take 40 mg by mouth daily.     Marland Kitchen oxyCODONE-acetaminophen (PERCOCET) 10-325 MG tablet Take 1 tablet by mouth every 4 (four) hours as needed. for pain 150 tablet 0  . promethazine (PHENERGAN) 25 MG tablet Take 1 tablet (25 mg total) by mouth every 8 (eight) hours as needed for nausea or vomiting. 20 tablet 0  . ranolazine (RANEXA) 1000 MG SR tablet TAKE 1 TABLET BY MOUTH TWICE A DAY 60 tablet 5  . sotalol (BETAPACE) 80 MG tablet Take 40 mg by mouth 2 (two) times daily.    Marland Kitchen SPIRIVA HANDIHALER 18 MCG inhalation capsule PLACE 1 CAPSULE INTO INHALER AND INHALE BY MOUTH ONCE A DAY. 30 capsule 0  . tiZANidine (ZANAFLEX) 4 MG tablet TAKE 1 TABLET BY MOUTH EVERY 6 HOURS AS NEEDED FOR MUSCLE SPASMS 30 tablet 3  . torsemide (DEMADEX) 100 MG tablet Take 0.5 tablets (50 mg total) by mouth daily. 30 tablet 3  . traZODone (DESYREL) 150 MG tablet TAKE ONE TABLET BY MOUTH AT BEDTIME 30 tablet 1  . venlafaxine XR (EFFEXOR-XR) 75 MG 24 hr capsule TAKE 1 CAPSULE BY MOUTH DAILY WITH BREAKFAST 90 capsule 4   No current facility-administered medications for this visit.    OBJECTIVE: There were no vitals filed for this visit.   There is no height or weight on file to calculate BMI.    ECOG FS:0 - Asymptomatic  Physical Exam Constitutional:      General: Vital signs are normal.     Appearance: Normal appearance. He is obese.  HENT:     Head: Normocephalic and atraumatic.  Eyes:     Pupils: Pupils are equal, round, and reactive to light.  Cardiovascular:     Rate and Rhythm: Normal rate and regular rhythm.     Heart sounds: Normal heart sounds. No murmur  heard.   Pulmonary:     Effort: Pulmonary effort is normal.     Breath sounds: Normal breath sounds. No wheezing.  Abdominal:     General: Bowel sounds are normal. There is no distension.     Palpations: Abdomen is soft.     Tenderness: There is no abdominal tenderness.  Musculoskeletal:        General: No edema. Normal range of motion.     Cervical back: Normal range of motion.  Skin:    General: Skin is warm and dry.     Findings: No rash.  Neurological:     Mental Status: He is alert and oriented to person, place, and time.  Psychiatric:        Judgment: Judgment normal.     LAB RESULTS:  Lab Results  Component Value Date   NA 141 12/10/2020   K 4.1 12/12/2020   CL 103 12/10/2020   CO2 18 (L) 12/10/2020   GLUCOSE 180 (H) 12/10/2020   BUN 15 12/10/2020   CREATININE 0.81 12/10/2020   CALCIUM 9.3 12/10/2020   PROT 5.8 (L) 10/07/2020   ALBUMIN 3.3 (L) 10/07/2020   AST 15 10/07/2020   ALT 15 10/07/2020   ALKPHOS 53 10/07/2020   BILITOT 0.6 10/07/2020   GFRNONAA >60 12/10/2020   GFRAA 97 09/24/2020    Lab Results  Component Value Date   WBC 5.7 12/24/2020   NEUTROABS 3.6 12/24/2020   HGB 17.3 (H) 12/24/2020   HCT 51.7 12/24/2020   MCV  94.5 12/24/2020   PLT 174 12/24/2020     STUDIES: DG Chest 2 View  Result Date: 12/10/2020 CLINICAL DATA:  65 year old male with chest pain. EXAM: CHEST - 2 VIEW COMPARISON:  Chest radiograph dated 11/24/2020. FINDINGS: No focal consolidation, pleural effusion or pneumothorax. Mild cardiomegaly. Median sternotomy wires, CABG vascular clips, and coronary stent. No acute osseous pathology. IMPRESSION: No active cardiopulmonary disease. Electronically Signed   By: Anner Crete M.D.   On: 12/10/2020 20:35   CT Head Wo Contrast  Result Date: 12/11/2020 CLINICAL DATA:  Syncope, head injury, chronic anticoagulation EXAM: CT HEAD WITHOUT CONTRAST TECHNIQUE: Contiguous axial images were obtained from the base of the skull through  the vertex without intravenous contrast. COMPARISON:  None. FINDINGS: Brain: Normal anatomic configuration. No abnormal intra or extra-axial mass lesion or fluid collection. No abnormal mass effect or midline shift. No evidence of acute intracranial hemorrhage or infarct. Ventricular size is normal. Cerebellum unremarkable. Vascular: Unremarkable Skull: Intact Sinuses/Orbits: Paranasal sinuses are clear. Orbits are unremarkable. Other: Mastoid air cells and middle ear cavities are clear. IMPRESSION: No acute intracranial injury.  No calvarial fracture Electronically Signed   By: Fidela Salisbury MD   On: 12/11/2020 01:17   MR BRAIN WO CONTRAST  Result Date: 12/11/2020 CLINICAL DATA:  Syncope, simple, normal neuro exam EXAM: MRI HEAD WITHOUT CONTRAST TECHNIQUE: Multiplanar, multiecho pulse sequences of the brain and surrounding structures were obtained without intravenous contrast. COMPARISON:  12/11/2020 and prior. FINDINGS: Brain: No diffusion-weighted signal abnormality. Remote left occipital microhemorrhage. No midline shift, ventriculomegaly or extra-axial fluid collection. No mass lesion. Minimal chronic microvascular ischemic changes. Vascular: Normal flow voids. Skull and upper cervical spine: Normal marrow signal. Sinuses/Orbits: Normal orbits. Mild pansinus mucosal thickening. Small nasopharyngeal mucous retention cyst. No mastoid effusion. Other: Small right parietal scalp lipoma. IMPRESSION: No acute intracranial process. Remote left occipital microhemorrhage. Minimal chronic microvascular ischemic changes. Electronically Signed   By: Primitivo Gauze M.D.   On: 12/11/2020 12:36    ASSESSMENT: Polycythemia.  PLAN:   1. Polycythemia: -Unclear etiology.   -Hemoglobin baseline between 17-20. -Work-up to date was negative including JAK2 and CLL labs. -Mildly elevated erythropoietin level. -CT abdomen/pelvis did not show any distinct pathology or renal lesions. -Denies any hyperviscosity  symptoms. -Scheduled for a phlebotomy but never completed secondary to transportation problems. -Labs from today show a hemoglobin of 17.3, hematocrit 51.7, ferritin 31 iron saturation 20%. -Patient is not symptomatic.  Do not recommend phlebotomy today given hemoglobin has trended down. -RTC in 3 months for follow-up with labs and possible phlebotomy.  Patient to call clinic if he develops any hyperviscosity symptoms.  Greater than 50% was spent in counseling and coordination of care with this patient including but not limited to discussion of the relevant topics above (See A&P) including, but not limited to diagnosis and management of acute and chronic medical conditions.   Patient expressed understanding and was in agreement with this plan. He also understands that He can call clinic at any time with any questions, concerns, or complaints.    Jacquelin Hawking, NP   01/03/2021 9:33 AM

## 2021-01-09 ENCOUNTER — Other Ambulatory Visit: Payer: Self-pay | Admitting: Family Medicine

## 2021-01-09 ENCOUNTER — Telehealth: Payer: Self-pay

## 2021-01-09 ENCOUNTER — Telehealth: Payer: Medicare HMO

## 2021-01-09 NOTE — Telephone Encounter (Signed)
  Chronic Care Management   Outreach Note  01/09/2021 Name: Lawrence [name] MRN: [id number] DOB: May 07, 1956  Primary Care Provider: Birdie Sons, MD Reason for referral : Chronic Care Management   An unsuccessful telephone outreach was attempted today. Lawrence [name] was referred to the case management team for assistance with care management and care coordination.    Follow Up Plan:  A HIPAA compliant voice message was left today requesting a return call. Will notify [facility] Care Guide regarding need to reschedule    Cristy [facility] Care Management [facility] [phone]

## 2021-01-15 ENCOUNTER — Ambulatory Visit (INDEPENDENT_AMBULATORY_CARE_PROVIDER_SITE_OTHER): Payer: Medicare HMO

## 2021-01-15 DIAGNOSIS — E1121 Type 2 diabetes mellitus with diabetic nephropathy: Secondary | ICD-10-CM

## 2021-01-15 DIAGNOSIS — I480 Paroxysmal atrial fibrillation: Secondary | ICD-10-CM | POA: Diagnosis not present

## 2021-01-15 DIAGNOSIS — I509 Heart failure, unspecified: Secondary | ICD-10-CM | POA: Diagnosis not present

## 2021-01-15 DIAGNOSIS — J449 Chronic obstructive pulmonary disease, unspecified: Secondary | ICD-10-CM

## 2021-01-15 NOTE — Chronic Care Management (AMB) (Signed)
Chronic Care Management   Initial Visit Note  01/15/2021 Name: Lawrence [name] MRN: [id number] DOB: 05/05/1956  Primary Care Provider: Birdie Sons, MD Reason for referral : Chronic Care Management   Lawrence [name] is a 65 y.o. year old male who is a primary care patient of [name], [name], MD. The CCM team was consulted for assistance with chronic disease management and care coordination. A telephonic assessment was conducted today.  Review of Lawrence [name] status, including review of consultants reports, relevant labs and test results was conducted today. Collaboration with appropriate care team members was performed as part of the comprehensive evaluation and provision of chronic care management services.    SDOH (Social Determinants of Health) assessments performed: Yes See Care Plan activities for detailed interventions related to SDOH  SDOH Interventions   Flowsheet Row Most Recent Value  SDOH Interventions   Food Insecurity Interventions Intervention Not Indicated  Transportation Interventions Intervention Not Indicated  Alcohol Brief Interventions/Follow-up AUDIT Score <7 follow-up not indicated       Medications: Outpatient Encounter Medications as of 01/15/2021  Medication Sig Note  . allopurinol (ZYLOPRIM) 300 MG tablet TAKE 1 TABLET BY MOUTH TWICE A DAY   . amLODipine (NORVASC) 2.5 MG tablet Take 1 tablet (2.5 mg total) by mouth daily.   Marland Kitchen aspirin 81 MG chewable tablet Chew 81 mg by mouth daily. Swallows whole   . BREO ELLIPTA 100-25 MCG/INH AEPB INHALE 1 PUFF INTO THE LUNGS DAILY   . cetirizine (ZYRTEC) 10 MG tablet TAKE ONE TABLET BY MOUTH AT BEDTIME   . ELIQUIS 5 MG TABS tablet TAKE 1 TABLET BY MOUTH TWICE A DAY   . FARXIGA 10 MG TABS tablet TAKE 1 TABLET BY MOUTH DAILY (Patient taking differently: Take 10 mg by mouth daily.)   . isosorbide mononitrate (IMDUR) 120 MG 24 hr tablet TAKE 1 TABLET BY MOUTH DAILY   . lisinopril (ZESTRIL) 10 MG tablet TAKE 1 TABLET BY  MOUTH DAILY   . magnesium oxide (MAG-OX) 400 (241.3 Mg) MG tablet Take 1 tablet (400 mg total) by mouth every morning.   . metFORMIN (GLUCOPHAGE-XR) 500 MG 24 hr tablet Take 500 mg by mouth 2 (two) times daily.   Marland Kitchen omega-3 acid ethyl esters (LOVAZA) 1 g capsule TAKE 4 CAPSULES BY MOUTH DAILY   . omeprazole (PRILOSEC) 40 MG capsule Take 40 mg by mouth daily.    . ranolazine (RANEXA) 1000 MG SR tablet TAKE 1 TABLET BY MOUTH TWICE A DAY   . sotalol (BETAPACE) 80 MG tablet Take 40 mg by mouth 2 (two) times daily.   Marland Kitchen SPIRIVA HANDIHALER 18 MCG inhalation capsule PLACE 1 CAPSULE INTO INHALER AND INHALE BY MOUTH ONCE A DAY.   Marland Kitchen tiZANidine (ZANAFLEX) 4 MG tablet TAKE 1 TABLET BY MOUTH EVERY 6 HOURS AS NEEDED FOR MUSCLE SPASMS   . traZODone (DESYREL) 150 MG tablet TAKE ONE TABLET BY MOUTH AT BEDTIME   . venlafaxine XR (EFFEXOR-XR) 75 MG 24 hr capsule TAKE 1 CAPSULE BY MOUTH DAILY WITH BREAKFAST   . albuterol (VENTOLIN HFA) 108 (90 Base) MCG/ACT inhaler INHALE 2 PUFFS BY MOUTH EVERY 6 HOURS AS NEEDED FOR SHORTNESS OF BREATH   . ALPRAZolam (XANAX) 1 MG tablet Take 1 tablet (1 mg total) by mouth 3 (three) times daily.   Marland Kitchen atorvastatin (LIPITOR) 80 MG tablet TAKE ONE TABLET BY MOUTH AT BEDTIME   . budesonide (PULMICORT) 0.5 MG/2ML nebulizer solution Take 0.5 mg by nebulization 2 (two) times daily  as needed (shortness of breath or wheezing).  (Patient not taking: Reported on 01/03/2021) 12/11/2020: Out of medication, only takes as needed  . docusate sodium (COLACE) 100 MG capsule Take 100 mg by mouth daily as needed for mild constipation.   Marland Kitchen LINZESS 72 MCG capsule Take 72 mcg by mouth daily before breakfast.    . naloxone (NARCAN) nasal spray 4 mg/0.1 mL 1 spray into nostril x1 and may repeat every 2-3 minutes until patient is responsive or EMS arrives (Patient not taking: Reported on 01/03/2021)   . nitroGLYCERIN (NITROSTAT) 0.4 MG SL tablet PLACE ONE TABLET UNDER THE TONGUE EVERY 5 MINUTES FOR CHEST PAIN. IF NO  RELIEF PAST 3RD TAB GO TO EMERGENCY ROOM (Patient not taking: Reported on 01/03/2021)   . oxyCODONE-acetaminophen (PERCOCET) 10-325 MG tablet Take 1 tablet by mouth every 4 (four) hours as needed. for pain   . promethazine (PHENERGAN) 25 MG tablet Take 1 tablet (25 mg total) by mouth every 8 (eight) hours as needed for nausea or vomiting.   . torsemide (DEMADEX) 100 MG tablet Take 0.5 tablets (50 mg total) by mouth daily. 01/15/2021: Reports taking as needed.   No facility-administered encounter medications on file as of 01/15/2021.     Objective:  Patient Care Plan: COPD (Adult)    Problem Identified: Symptom Exacerbation (COPD)     Long-Range Goal: Symptom Exacerbation Prevented or Minimized   Start Date: 01/15/2021  Expected End Date: 05/15/2021  Priority: High  Note:   Current Barriers:  . Chronic Disease Management support and educational needs r/t COPD  Case Manager Clinical Goal(s): Marland Kitchen Over the next 120 days, patient will not require hospitalization for complications r/t COPD exacerbation  Interventions:  . Collaboration with Birdie Sons, MD regarding development and update of comprehensive plan of care as evidenced by provider attestation and co-signature . Inter-disciplinary care team collaboration (see longitudinal plan of care) . Reviewed medications. Reports compliance with prescribed inhalers. Encouraged to continue taking medications and utilizing inhalers as prescribed. Encouraged to notify the care management team with concerns regarding prescription cost. . Provided verbal COPD education on self care/management and exacerbation prevention. Reports symptoms are usually triggered during the summer months. Encouraged to take needed precautions and avoid extreme temperatures to prevent exacerbation. . Discussed current action plan and reinforced importance of daily self assessment. Advised to make an appointment with his provider if in the yellow zone for 48 hours without  improvement. Reviewed worsening symptoms that require immediate medical attention.  . Provided information regarding infection prevention and increased risk r/t COPD.  Advised to utilize  prevention strategies to reduce risk of respiratory infection.   Self-Care Activities/Patient Goals: Over the next 120 days, patient will: -Take medications and utilize inhalers as prescribed -Assess symptoms daily and notify provider if in the yellow zone for 48 hrs without improvement -Avoid extreme temperatures and strenuous activites -Follow recommendations to prevent respiratory infection -Notify provider or care management team with questions and new concerns as needed   Follow Up Plan:  Will follow up next month   Patient Care Plan: Diabetes Type 2 (Adult)    Problem Identified: Glycemic Management (Diabetes, Type 2)     Long-Range Goal: Glycemic Management Optimized   Start Date: 01/15/2021  Expected End Date: 05/15/2021  Priority: High  Note:   Objective:  Lab Results  Component Value Date   HGBA1C 7.5 (H) 12/12/2020 .   Lab Results  Component Value Date   CREATININE 0.81 12/10/2020   CREATININE 0.91  11/23/2020   CREATININE 0.88 10/08/2020 .   Marland Kitchen No results found for: EGFR  Current Barriers:  . Chronic disease management and support related to Diabetes self-management   Case Manager Clinical Goal(s):  Marland Kitchen Over the next 120 days, patient will demonstrate improved adherence to prescribed treatment plan for Diabetes self-management as evidenced by daily monitoring and recording of CBG, adherence to ADA/ carb modified diet and adherence to prescribed medication regimen.   Interventions:  . Collaboration with Birdie Sons, MD regarding development and update of comprehensive plan of care as evidenced by provider attestation and co-signature . Inter-disciplinary care team collaboration (see longitudinal plan of care) . Reviewed medications. Encouraged to take medications as  prescribed and notify team with concerns regarding prescription cost. . Provided information regarding importance of consistent blood glucose monitoring.  Reports most recent reading of 125 mg/dl. Encouraged to monitor and maintain a log. . Reviewed s/sx of hypoglycemia and hyperglycemia along with recommended interventions. . Discussed nutritional intake and compliance with a carb modified diet. Reports good nutritional intake. Reports receiving diabetic/heart healthy meals via [employer].  . Discussed and offered referrals for available Diabetes education classes. Declined current need for additional resources. Feels that overall diabetes management is improving. . Discussed importance of completing recommended DM preventive care. He reports completing recommended foot care. He is due for an eye exam. Recalls last exam was approximately two years ago. He plans to follow up with his Optometrist to schedule.   Patient Goals/Self-Care Activities Over the next 120 days, patient will:  - Self-administer medications as prescribed - Attend all scheduled provider appointments - Monitor blood glucose levels consistently and utilize recommended interventions - Adhere to prescribed ADA/carb modified - Notify provider or care management team with questions and new concerns as needed   Follow Up Plan:  Will follow up next month     Patient Care Plan: Heart Failure (Adult)    Problem Identified: Symptom Exacerbation (Heart Failure)     Long-Range Goal: Symptom Exacerbation Prevented or Minimized   Start Date: 01/15/2021  Expected End Date: 05/15/2021  Priority: High  Note:   Wt Readings from Last 3 Encounters:  01/03/21 223 lb 8 oz (101.4 kg)  12/16/20 217 lb (98.4 kg)  12/12/20 219 lb 2.2 oz (99.4 kg)    Current Barriers:  . Chronic Disease Management support and educational needs r/t CHF.  Case Manager Clinical Goal(s):   Over the next 120 days, patient will not require hospitalization  or emergent care d/t complications r/t CHF exacerbation.  Interventions:  . Collaboration with Birdie Sons, MD regarding development and update of comprehensive plan of care as evidenced by provider attestation and co-signature . Inter-disciplinary care team collaboration (see longitudinal plan of care) . Discussed current plan for CHF management. Encouraged to continue taking medications as prescribed. Encouraged to assess symptoms daily and contact clinic with concerns as needed. Encouraged to continue attempting to adhere to a heart healthy diet. Advised to closely monitor sodium consumption and avoid highly processed foods when possible. . Provided information regarding recommended weight parameters. Reports recent weight around 223 lbs. Encouraged to weigh daily and record readings. Encouraged to notify provider for weight gain greater than 3 lbs overnight or weight gain greater than 5 lbs within a week. . Discussed mild s/sx of fluid overload and indications for notifying a provider.. . Reviewed worsening s/sx related to CHF exacerbation and indications for seeking immediate medical attention.   Patient Goals/Self-Care Activities:  Over the next  120 day, patient will: -Take all medications as prescribed -Monitor weight and record readings -Adhere to recommended cardiac prudent/heart healthy diet -Notify provider for weight gain outside of established parameters -Contact provider or care management team with questions and new concerns as needed.   Follow Up Plan:  Will follow up next month            Lawrence [name] was given information about Chronic Care Management services including:  1. CCM service includes personalized support from designated clinical staff supervised by his physician, including individualized plan of care and coordination with other care providers 2. 24/7 contact phone numbers for assistance for urgent and routine care needs. 3. Service will only be  billed when office clinical staff spend 20 minutes or more in a month to coordinate care. 4. Only one practitioner may furnish and bill the service in a calendar month. 5. The patient may stop CCM services at any time (effective at the end of the month) by phone call to the office staff. 6. The patient will be responsible for cost sharing (co-pay) of up to 20% of the service fee (after annual deductible is met).  Patient agreed to services and verbal consent obtained.    PLAN A member of the care management team will follow up with Lawrence [name] next month.   [facility]rist[facility] [phone]

## 2021-01-18 ENCOUNTER — Other Ambulatory Visit: Payer: Self-pay | Admitting: Family Medicine

## 2021-01-18 NOTE — Telephone Encounter (Signed)
Requested Prescriptions  Pending Prescriptions Disp Refills  . magnesium oxide (MAG-OX) 400 (241.3 Mg) MG tablet [Pharmacy Med Name: MAGNESIUM OXIDE 400 (241.3 MG) MG T] 90 tablet 0    Sig: TAKE 1 TABLET BY MOUTH EVERY MORNING     Endocrinology:  Minerals - Magnesium Supplementation Passed - 01/18/2021 10:04 AM      Passed - Mg Level in normal range and within 360 days    Magnesium  Date Value Ref Range Status  12/12/2020 1.8 1.7 - 2.4 mg/dL Final    Comment:    Performed at [facility], [address] [place], [place] [address]  05/24/2014 1.9 mg/dL Final    Comment:    1.8-2.4 THERAPEUTIC RANGE: 4-7 mg/dL TOXIC: > 10 mg/dL  -----------------------          Passed - Valid encounter within last 12 months    Recent Outpatient Visits          1 month ago Type 2 diabetes mellitus with diabetic nephropathy, without long-term current use of insulin ([place])   [facility] Birdie Sons, MD   3 months ago Chest wall pain   [facility] Birdie Sons, MD   3 months ago Type 2 diabetes mellitus with diabetic nephropathy, without long-term current use of insulin [facility])   [facility] Birdie Sons, MD   8 months ago Type 2 diabetes mellitus with diabetic nephropathy, without long-term current use of insulin [facility])   [facility] Birdie Sons, MD   9 months ago Insomnia, unspecified type   [facility] Birdie Sons, MD      Future Appointments            In 1 month [name], [name], MD [facility], [place]

## 2021-01-20 ENCOUNTER — Other Ambulatory Visit: Payer: Self-pay | Admitting: Family Medicine

## 2021-01-20 ENCOUNTER — Telehealth: Payer: Self-pay

## 2021-01-20 DIAGNOSIS — I10 Essential (primary) hypertension: Secondary | ICD-10-CM | POA: Diagnosis not present

## 2021-01-20 DIAGNOSIS — I480 Paroxysmal atrial fibrillation: Secondary | ICD-10-CM | POA: Diagnosis not present

## 2021-01-20 DIAGNOSIS — I214 Non-ST elevation (NSTEMI) myocardial infarction: Secondary | ICD-10-CM | POA: Diagnosis not present

## 2021-01-20 DIAGNOSIS — I25118 Atherosclerotic heart disease of native coronary artery with other forms of angina pectoris: Secondary | ICD-10-CM | POA: Diagnosis not present

## 2021-01-20 DIAGNOSIS — I509 Heart failure, unspecified: Secondary | ICD-10-CM | POA: Diagnosis not present

## 2021-01-20 NOTE — Telephone Encounter (Signed)
Copied from [place] [id number]. Topic: General - Other >> Jan 20, 2021  2:46 PM Keene Breath wrote: Reason for CRM: Patient stated that he is returning a call to [place].  He said he talked to him last week and went over his medications at that time.  He would like her to call him at [phone]

## 2021-01-20 NOTE — Telephone Encounter (Signed)
Requested medication (s) are due for refill today: no  Requested medication (s) are on the active medication list: yes  Last refill:  12/30/20  Future visit scheduled: yes  Notes to clinic:  med not delegated to NT to RF   Requested Prescriptions  Pending Prescriptions Disp Refills   oxyCODONE-acetaminophen (PERCOCET) 10-325 MG tablet [Pharmacy Med Name: [id number]-CHYI 10-325 MG TAB] 150 tablet     Sig: TAKE 1 TABLET BY MOUTH EVERY 4 HOURS AS NEEDED FOR PAIN      Not Delegated - Analgesics:  Opioid Agonist Combinations Failed - 01/20/2021 10:48 AM      Failed - This refill cannot be delegated      Passed - Urine Drug Screen completed in last 360 days      Passed - Valid encounter within last 6 months    Recent Outpatient Visits           1 month ago Type 2 diabetes mellitus with diabetic nephropathy, without long-term current use of insulin ([place])   [facility] Birdie Sons, MD   3 months ago Chest wall pain   [facility] Birdie Sons, MD   3 months ago Type 2 diabetes mellitus with diabetic nephropathy, without long-term current use of insulin ([place])   [facility] Birdie Sons, MD   8 months ago Type 2 diabetes mellitus with diabetic nephropathy, without long-term current use of insulin [facility])   [facility] Birdie Sons, MD   9 months ago Insomnia, unspecified type   [facility] Birdie Sons, MD       Future Appointments             In 1 month [name], [name], MD [facility], [place]

## 2021-01-21 ENCOUNTER — Ambulatory Visit: Payer: Self-pay

## 2021-01-21 DIAGNOSIS — E1121 Type 2 diabetes mellitus with diabetic nephropathy: Secondary | ICD-10-CM

## 2021-01-21 NOTE — Telephone Encounter (Signed)
It looks like patient spoke with nurse Felecia last week. Patient is requesting to speak with her again.

## 2021-01-22 ENCOUNTER — Other Ambulatory Visit: Payer: Self-pay | Admitting: Family Medicine

## 2021-01-22 NOTE — Telephone Encounter (Signed)
Requested Prescriptions  Pending Prescriptions Disp Refills  . isosorbide mononitrate (IMDUR) 120 MG 24 hr tablet [Pharmacy Med Name: ISOSORBIDE MONONITRATE ER 120 MG TA] 90 tablet 0    Sig: TAKE 1 TABLET BY MOUTH DAILY     Cardiovascular:  Nitrates Passed - 01/22/2021 10:38 AM      Passed - Last BP in normal range    BP Readings from Last 1 Encounters:  01/03/21 129/77         Passed - Last Heart Rate in normal range    Pulse Readings from Last 1 Encounters:  01/03/21 72         Passed - Valid encounter within last 12 months    Recent Outpatient Visits          1 month ago Type 2 diabetes mellitus with diabetic nephropathy, without long-term current use of insulin ([place])   [facility] Birdie Sons, MD   3 months ago Chest wall pain   [facility] Birdie Sons, MD   4 months ago Type 2 diabetes mellitus with diabetic nephropathy, without long-term current use of insulin [facility])   [facility] Birdie Sons, MD   8 months ago Type 2 diabetes mellitus with diabetic nephropathy, without long-term current use of insulin [facility])   [facility] Birdie Sons, MD   9 months ago Insomnia, unspecified type   [facility] Birdie Sons, MD      Future Appointments            In 1 month [name], [name], MD [facility], [place]

## 2021-01-26 NOTE — Patient Instructions (Signed)
Thank you for allowing the Chronic Care Management team to participate in your care.    Patient Care Plan: COPD (Adult)    Problem Identified: Symptom Exacerbation (COPD)     Long-Range Goal: Symptom Exacerbation Prevented or Minimized   Start Date: 01/15/2021  Expected End Date: 05/15/2021  Priority: High  Note:   Current Barriers:  . Chronic Disease Management support and educational needs r/t COPD  Case Manager Clinical Goal(s): Marland Kitchen Over the next 120 days, patient will not require hospitalization for complications r/t COPD exacerbation  Interventions:  . Collaboration with Birdie Sons, MD regarding development and update of comprehensive plan of care as evidenced by provider attestation and co-signature . Inter-disciplinary care team collaboration (see longitudinal plan of care) . Reviewed medications. Reports compliance with prescribed inhalers. Encouraged to continue taking medications and utilizing inhalers as prescribed. Encouraged to notify the care management team with concerns regarding prescription cost. . Provided verbal COPD education on self care/management and exacerbation prevention. Reports symptoms are usually triggered during the summer months. Encouraged to take needed precautions and avoid extreme temperatures to prevent exacerbation. . Discussed current action plan and reinforced importance of daily self assessment. Advised to make an appointment with his provider if in the yellow zone for 48 hours without improvement. Reviewed worsening symptoms that require immediate medical attention.  . Provided information regarding infection prevention and increased risk r/t COPD.  Advised to utilize  prevention strategies to reduce risk of respiratory infection.   Self-Care Activities/Patient Goals: Over the next 120 days, patient will: -Take medications and utilize inhalers as prescribed -Assess symptoms daily and notify provider if in the yellow zone for 48 hrs without  improvement -Avoid extreme temperatures and strenuous activites -Follow recommendations to prevent respiratory infection -Notify provider or care management team with questions and new concerns as needed   Follow Up Plan:  Will follow up next month   Patient Care Plan: Diabetes Type 2 (Adult)    Problem Identified: Glycemic Management (Diabetes, Type 2)     Long-Range Goal: Glycemic Management Optimized   Start Date: 01/15/2021  Expected End Date: 05/15/2021  Priority: High  Note:   Objective:  Lab Results  Component Value Date   HGBA1C 7.5 (H) 12/12/2020 .   Lab Results  Component Value Date   CREATININE 0.81 12/10/2020   CREATININE 0.91 11/23/2020   CREATININE 0.88 10/08/2020 .   Marland Kitchen No results found for: EGFR  Current Barriers:  . Chronic disease management and support related to Diabetes self-management   Case Manager Clinical Goal(s):  Marland Kitchen Over the next 120 days, patient will demonstrate improved adherence to prescribed treatment plan for Diabetes self-management as evidenced by daily monitoring and recording of CBG, adherence to ADA/ carb modified diet and adherence to prescribed medication regimen.   Interventions:  . Collaboration with Birdie Sons, MD regarding development and update of comprehensive plan of care as evidenced by provider attestation and co-signature . Inter-disciplinary care team collaboration (see longitudinal plan of care) . Reviewed medications. Encouraged to take medications as prescribed and notify team with concerns regarding prescription cost. . Provided information regarding importance of consistent blood glucose monitoring.  Reports most recent reading of 125 mg/dl. Encouraged to monitor and maintain a log. . Reviewed s/sx of hypoglycemia and hyperglycemia along with recommended interventions. . Discussed nutritional intake and compliance with a carb modified diet. Reports good nutritional intake. Reports receiving diabetic/heart healthy  meals via [employer].  . Discussed and offered referrals for available Diabetes  education classes. Declined current need for additional resources. Feels that overall diabetes management is improving. . Discussed importance of completing recommended DM preventive care. He reports completing recommended foot care. He is due for an eye exam. Recalls last exam was approximately two years ago. He plans to follow up with his Optometrist to schedule.   Patient Goals/Self-Care Activities Over the next 120 days, patient will:  - Self-administer medications as prescribed - Attend all scheduled provider appointments - Monitor blood glucose levels consistently and utilize recommended interventions - Adhere to prescribed ADA/carb modified - Notify provider or care management team with questions and new concerns as needed   Follow Up Plan:  Will follow up next month     Patient Care Plan: Heart Failure (Adult)    Problem Identified: Symptom Exacerbation (Heart Failure)     Long-Range Goal: Symptom Exacerbation Prevented or Minimized   Start Date: 01/15/2021  Expected End Date: 05/15/2021  Priority: High  Note:   Wt Readings from Last 3 Encounters:  01/03/21 223 lb 8 oz (101.4 kg)  12/16/20 217 lb (98.4 kg)  12/12/20 219 lb 2.2 oz (99.4 kg)    Current Barriers:  . Chronic Disease Management support and educational needs r/t CHF.  Case Manager Clinical Goal(s):   Over the next 120 days, patient will not require hospitalization or emergent care d/t complications r/t CHF exacerbation.  Interventions:  . Collaboration with Birdie Sons, MD regarding development and update of comprehensive plan of care as evidenced by provider attestation and co-signature . Inter-disciplinary care team collaboration (see longitudinal plan of care) . Discussed current plan for CHF management. Encouraged to continue taking medications as prescribed. Encouraged to assess symptoms daily and contact clinic with  concerns as needed. Encouraged to continue attempting to adhere to a heart healthy diet. Advised to closely monitor sodium consumption and avoid highly processed foods when possible. . Provided information regarding recommended weight parameters. Reports recent weight around 223 lbs. Encouraged to weigh daily and record readings. Encouraged to notify provider for weight gain greater than 3 lbs overnight or weight gain greater than 5 lbs within a week. . Discussed mild s/sx of fluid overload and indications for notifying a provider.. . Reviewed worsening s/sx related to CHF exacerbation and indications for seeking immediate medical attention.   Patient Goals/Self-Care Activities:  Over the next 120 day, patient will: -Take all medications as prescribed -Monitor weight and record readings -Adhere to recommended cardiac prudent/heart healthy diet -Notify provider for weight gain outside of established parameters -Contact provider or care management team with questions and new concerns as needed.   Follow Up Plan:  Will follow up next month                Mr. Krol was given information about Chronic Care Management services including:  1. CCM service includes personalized support from designated clinical staff supervised by his physician, including individualized plan of care and coordination with other care providers 2. 24/7 contact phone numbers for assistance for urgent and routine care needs. 3. Service will only be billed when office clinical staff spend 20 minutes or more in a month to coordinate care. 4. Only one practitioner may furnish and bill the service in a calendar month. 5. The patient may stop CCM services at any time (effective at the end of the month) by phone call to the office staff. 6. The patient will be responsible for cost sharing (co-pay) of up to 20% of the service fee (after annual  deductible is met).  Patient agreed to services and verbal consent  obtained.     Mr. Rivet verbalized understanding of the information discussed during the telephonic outreach today. Declined need for mailed/printed instructions. A member of the care management team will follow up with Mr. Route next month.   [facility] Care Management [facility] [phone]

## 2021-01-27 NOTE — Chronic Care Management (AMB) (Signed)
  Chronic Care Management   Note   Name: Lawrence [name] MRN: [id number] DOB: 05-31-1956    Received message regarding Lawrence [name]'s request for outreach.  Lawrence [name] was contacted today. Reports doing well today. Requesting an update regarding the status of a Dermatology referral and medication refills.  Per chart review, he has pending medication refills at [facility]. Informed that a new order will be required if he needs additional Percocet. He recalls being referred to Dermatology in the past but unsure if a new referral is needed.   Follow up plan: Medication refills pending Will follow up regarding Dermatology referral and notify Dr. [name] if a new referral is needed Will follow up with Lawrence [name] within the next week.     [facility] Care Management [facility] [phone]

## 2021-01-29 ENCOUNTER — Telehealth: Payer: Self-pay

## 2021-01-29 DIAGNOSIS — L409 Psoriasis, unspecified: Secondary | ICD-10-CM

## 2021-01-29 NOTE — Telephone Encounter (Signed)
Copied from [facility] [id number]. Topic: Referral - Request for Referral >> Jan 29, 2021  2:30 PM Celene Kras wrote: Has patient seen PCP for this complaint? Yes.   *If NO, is insurance requiring patient see PCP for this issue before PCP can refer them? Referral for which specialty: Dermatology Preferred provider/office: N/A Reason for referral: Pt states that he spoke with nurse the other day regarding this and was told this would be brought to PCP attention. He states that he has psoriasis and that he is needing to be seen. Please advise.

## 2021-01-31 ENCOUNTER — Ambulatory Visit: Payer: Self-pay

## 2021-01-31 DIAGNOSIS — J449 Chronic obstructive pulmonary disease, unspecified: Secondary | ICD-10-CM

## 2021-01-31 DIAGNOSIS — E1121 Type 2 diabetes mellitus with diabetic nephropathy: Secondary | ICD-10-CM | POA: Diagnosis not present

## 2021-01-31 DIAGNOSIS — I509 Heart failure, unspecified: Secondary | ICD-10-CM

## 2021-01-31 DIAGNOSIS — I480 Paroxysmal atrial fibrillation: Secondary | ICD-10-CM | POA: Diagnosis not present

## 2021-01-31 NOTE — Chronic Care Management (AMB) (Signed)
Chronic Care Management   Follow Up Note   01/31/2021 Name: Lawrence [name] MRN: [id number] DOB: [birth year]  Primary Care Provider: Birdie Sons, MD Reason for referral : Chronic Care Management   Lawrence [name] is a 65 y.o. year old male who is a primary care patient of Lawrence Sons, MD. He is currently enrolled in the chronic care management program.   Review of Lawrence [name] status, including review of consultants reports, relevant labs and test results was conducted today. Collaboration with appropriate care team members was performed as part of the comprehensive evaluation and provision of chronic care management services.    SDOH (Social Determinants of Health) assessments performed: No    Outpatient Encounter Medications as of 01/31/2021  Medication Sig Note  . albuterol (VENTOLIN HFA) 108 (90 Base) MCG/ACT inhaler INHALE 2 PUFFS BY MOUTH EVERY 6 HOURS AS NEEDED FOR SHORTNESS OF BREATH   . allopurinol (ZYLOPRIM) 300 MG tablet TAKE 1 TABLET BY MOUTH TWICE A DAY   . ALPRAZolam (XANAX) 1 MG tablet Take 1 tablet (1 mg total) by mouth 3 (three) times daily.   Marland Kitchen amLODipine (NORVASC) 2.5 MG tablet Take 1 tablet (2.5 mg total) by mouth daily.   Marland Kitchen aspirin 81 MG chewable tablet Chew 81 mg by mouth daily. Swallows whole   . atorvastatin (LIPITOR) 80 MG tablet TAKE ONE TABLET BY MOUTH AT BEDTIME   . BREO ELLIPTA 100-25 MCG/INH AEPB INHALE 1 PUFF INTO THE LUNGS DAILY   . budesonide (PULMICORT) 0.5 MG/2ML nebulizer solution Take 0.5 mg by nebulization 2 (two) times daily as needed (shortness of breath or wheezing).  (Patient not taking: Reported on 01/03/2021) 12/11/2020: Out of medication, only takes as needed  . cetirizine (ZYRTEC) 10 MG tablet TAKE ONE TABLET BY MOUTH AT BEDTIME   . docusate sodium (COLACE) 100 MG capsule Take 100 mg by mouth daily as needed for mild constipation.   Marland Kitchen ELIQUIS 5 MG TABS tablet TAKE 1 TABLET BY MOUTH TWICE A DAY   . FARXIGA 10 MG TABS tablet TAKE 1  TABLET BY MOUTH DAILY (Patient taking differently: Take 10 mg by mouth daily.)   . isosorbide mononitrate (IMDUR) 120 MG 24 hr tablet TAKE 1 TABLET BY MOUTH DAILY   . LINZESS 72 MCG capsule Take 72 mcg by mouth daily before breakfast.    . lisinopril (ZESTRIL) 10 MG tablet TAKE 1 TABLET BY MOUTH DAILY   . magnesium oxide (MAG-OX) 400 (241.3 Mg) MG tablet TAKE 1 TABLET BY MOUTH EVERY MORNING   . metFORMIN (GLUCOPHAGE-XR) 500 MG 24 hr tablet Take 500 mg by mouth 2 (two) times daily.   . naloxone (NARCAN) nasal spray 4 mg/0.1 mL 1 spray into nostril x1 and may repeat every 2-3 minutes until patient is responsive or EMS arrives (Patient not taking: Reported on 01/03/2021)   . nitroGLYCERIN (NITROSTAT) 0.4 MG SL tablet PLACE ONE TABLET UNDER THE TONGUE EVERY 5 MINUTES FOR CHEST PAIN. IF NO RELIEF PAST 3RD TAB GO TO EMERGENCY ROOM (Patient not taking: Reported on 01/03/2021)   . omega-3 acid ethyl esters (LOVAZA) 1 g capsule TAKE 4 CAPSULES BY MOUTH DAILY   . omeprazole (PRILOSEC) 40 MG capsule Take 40 mg by mouth daily.    Marland Kitchen oxyCODONE-acetaminophen (PERCOCET) 10-325 MG tablet TAKE 1 TABLET BY MOUTH EVERY 4 HOURS AS NEEDED FOR PAIN   . promethazine (PHENERGAN) 25 MG tablet Take 1 tablet (25 mg total) by mouth every 8 (eight) hours as needed for nausea  or vomiting.   . ranolazine (RANEXA) 1000 MG SR tablet TAKE 1 TABLET BY MOUTH TWICE A DAY   . sotalol (BETAPACE) 80 MG tablet Take 40 mg by mouth 2 (two) times daily.   Marland Kitchen SPIRIVA HANDIHALER 18 MCG inhalation capsule PLACE 1 CAPSULE INTO INHALER AND INHALE BY MOUTH ONCE A DAY.   Marland Kitchen tiZANidine (ZANAFLEX) 4 MG tablet TAKE 1 TABLET BY MOUTH EVERY 6 HOURS AS NEEDED FOR MUSCLE SPASMS   . torsemide (DEMADEX) 100 MG tablet Take 0.5 tablets (50 mg total) by mouth daily. 01/15/2021: Reports taking as needed.  . traZODone (DESYREL) 150 MG tablet TAKE ONE TABLET BY MOUTH AT BEDTIME   . venlafaxine XR (EFFEXOR-XR) 75 MG 24 hr capsule TAKE 1 CAPSULE BY MOUTH DAILY WITH  BREAKFAST    No facility-administered encounter medications on file as of 01/31/2021.     Objective:  Patient Care Plan: COPD (Adult)    Problem Identified: Symptom Exacerbation (COPD)     Long-Range Goal: Symptom Exacerbation Prevented or Minimized   Start Date: 01/15/2021  Expected End Date: 05/15/2021  Priority: High  Note:   Current Barriers:  . Chronic Disease Management support and educational needs r/t COPD  Case Manager Clinical Goal(s): Marland Kitchen Over the next 120 days, patient will not require hospitalization for complications r/t COPD exacerbation  Interventions:  . Collaboration with Lawrence Sons, MD regarding development and update of comprehensive plan of care as evidenced by provider attestation and co-signature . Inter-disciplinary care team collaboration (see longitudinal plan of care) . Reviewed medications. Reports compliance with prescribed inhalers. Encouraged to continue taking medications and utilizing inhalers as prescribed. Encouraged to notify the care management team updated with concerns regarding prescription cost. . Discussed current action plan and reinforced importance of daily self assessment.  Reports a productive cough with dark yellow sputum about three days ago. Reports the coughing has cleared. Denies shortness of breath. He is aware of need to be evaluated if the cough/discolored sputum returns d/t his high risk for respiratory infections.   Self-Care Activities/Patient Goals: Over the next 120 days, patient will: -Take medications and utilize inhalers as prescribed -Assess symptoms daily and notify provider if in the yellow zone for 48 hrs without improvement -Avoid extreme temperatures and strenuous activites -Follow recommendations to prevent respiratory infection -Notify provider or care management team with questions and new concerns as needed   Follow Up Plan:  Will follow up next month   Patient Care Plan: Diabetes Type 2 (Adult)     Problem Identified: Glycemic Management (Diabetes, Type 2)     Long-Range Goal: Glycemic Management Optimized   Start Date: 01/15/2021  Expected End Date: 05/15/2021  Priority: High  Note:   Objective:  Lab Results  Component Value Date   HGBA1C 7.5 (H) 12/12/2020 .   Lab Results  Component Value Date   CREATININE 0.81 12/10/2020   CREATININE 0.91 11/23/2020   CREATININE 0.88 10/08/2020 .   Marland Kitchen No results found for: EGFR  Current Barriers:  . Chronic disease management and support related to Diabetes self-management   Case Manager Clinical Goal(s):  Marland Kitchen Over the next 120 days, patient will demonstrate improved adherence to prescribed treatment plan for Diabetes self-management as evidenced by daily monitoring and recording of CBG, adherence to ADA/ carb modified diet and adherence to prescribed medication regimen.   Interventions:  . Collaboration with Lawrence Sons, MD regarding development and update of comprehensive plan of care as evidenced by provider attestation and co-signature .  Inter-disciplinary care team collaboration (see longitudinal plan of care) . Reviewed medications. Encouraged to take medications as prescribed and notify team with concerns regarding prescription cost. . Provided information regarding importance of consistent blood glucose monitoring.  Reports fasting readings over the past week have been in the 130's. Encouraged to continue monitoring and maintain a log. . Reviewed s/sx of hypoglycemia and hyperglycemia along with recommended interventions. . Discussed nutritional intake and compliance with a carb modified diet. His home readings have slightly increased from the 120's to the 130's. Reports he is attempting to monitor is intake. Declined need for additional nutritional information.    Patient Goals/Self-Care Activities Over the next 120 days, patient will:  - Self-administer oral medications as prescribed - Attend all scheduled provider  appointments - Monitor blood glucose levels consistently and utilize recommended interventions - Adhere to prescribed ADA/carb modified - Notify provider or care management team with questions and new concerns as needed   Follow Up Plan:  Will follow up next month           PLAN A member of the care management team will follow up with Mr. Chadderdon next month.    [facility] Care [facility] [phone]

## 2021-02-03 NOTE — Patient Instructions (Signed)
Thank you for allowing the Chronic Care Management team to participate in your care.   Goals Addressed: Patient Care Plan: COPD (Adult)    Problem Identified: Symptom Exacerbation (COPD)     Long-Range Goal: Symptom Exacerbation Prevented or Minimized   Start Date: 01/15/2021  Expected End Date: 05/15/2021  Priority: High  Note:   Current Barriers:  . Chronic Disease Management support and educational needs r/t COPD  Case Manager Clinical Goal(s): Marland Kitchen Over the next 120 days, patient will not require hospitalization for complications r/t COPD exacerbation  Interventions:  . Collaboration with Birdie Sons, MD regarding development and update of comprehensive plan of care as evidenced by provider attestation and co-signature . Inter-disciplinary care team collaboration (see longitudinal plan of care) . Reviewed medications. Reports compliance with prescribed inhalers. Encouraged to continue taking medications and utilizing inhalers as prescribed. Encouraged to notify the care management team updated with concerns regarding prescription cost. . Discussed current action plan and reinforced importance of daily self assessment.  Reports a productive cough with dark yellow sputum about three days ago. Reports the coughing has cleared. Denies shortness of breath. He is aware of need to be evaluated if the cough/discolored sputum returns d/t his high risk for respiratory infections.   Self-Care Activities/Patient Goals: Over the next 120 days, patient will: -Take medications and utilize inhalers as prescribed -Assess symptoms daily and notify provider if in the yellow zone for 48 hrs without improvement -Avoid extreme temperatures and strenuous activites -Follow recommendations to prevent respiratory infection -Notify provider or care management team with questions and new concerns as needed   Follow Up Plan:  Will follow up next month   Patient Care Plan: Diabetes Type 2 (Adult)     Problem Identified: Glycemic Management (Diabetes, Type 2)     Long-Range Goal: Glycemic Management Optimized   Start Date: 01/15/2021  Expected End Date: 05/15/2021  Priority: High  Note:   Objective:  Lab Results  Component Value Date   HGBA1C 7.5 (H) 12/12/2020 .   Lab Results  Component Value Date   CREATININE 0.81 12/10/2020   CREATININE 0.91 11/23/2020   CREATININE 0.88 10/08/2020 .   Marland Kitchen No results found for: EGFR  Current Barriers:  . Chronic disease management and support related to Diabetes self-management   Case Manager Clinical Goal(s):  Marland Kitchen Over the next 120 days, patient will demonstrate improved adherence to prescribed treatment plan for Diabetes self-management as evidenced by daily monitoring and recording of CBG, adherence to ADA/ carb modified diet and adherence to prescribed medication regimen.   Interventions:  . Collaboration with Birdie Sons, MD regarding development and update of comprehensive plan of care as evidenced by provider attestation and co-signature . Inter-disciplinary care team collaboration (see longitudinal plan of care) . Reviewed medications. Encouraged to take medications as prescribed and notify team with concerns regarding prescription cost. . Provided information regarding importance of consistent blood glucose monitoring.  Reports fasting readings over the past week have been in the 130's. Encouraged to continue monitoring and maintain a log. . Reviewed s/sx of hypoglycemia and hyperglycemia along with recommended interventions. . Discussed nutritional intake and compliance with a carb modified diet. His home readings have slightly increased from the 120's to the 130's. Reports he is attempting to monitor is intake. Declined need for additional nutritional information.    Patient Goals/Self-Care Activities Over the next 120 days, patient will:  - Self-administer oral medications as prescribed - Attend all scheduled provider  appointments - Monitor  blood glucose levels consistently and utilize recommended interventions - Adhere to prescribed ADA/carb modified - Notify provider or care management team with questions and new concerns as needed   Follow Up Plan:  Will follow up next month            Mr. Schulke verbalized understanding of the information discussed during the telephonic outreach today. Declined need for mailed/printed instructions. A member of the care management team will follow up with Mr. Goto next month.    [facility] Care [facility] [phone]

## 2021-02-05 ENCOUNTER — Other Ambulatory Visit: Payer: Self-pay | Admitting: Family Medicine

## 2021-02-05 DIAGNOSIS — G4700 Insomnia, unspecified: Secondary | ICD-10-CM

## 2021-02-11 ENCOUNTER — Other Ambulatory Visit: Payer: Self-pay | Admitting: Family Medicine

## 2021-02-11 NOTE — Telephone Encounter (Signed)
Requested Prescriptions  Pending Prescriptions Disp Refills  . atorvastatin (LIPITOR) 80 MG tablet [Pharmacy Med Name: ATORVASTATIN CALCIUM 80 MG TAB] 90 tablet 1    Sig: TAKE ONE TABLET BY MOUTH AT BEDTIME     Cardiovascular:  Antilipid - Statins Failed - 02/11/2021  1:24 PM      Failed - HDL in normal range and within 360 days    HDL Cholesterol  Date Value Ref Range Status  05/23/2014 28 (L) 40 - 60 mg/dL Final   HDL  Date Value Ref Range Status  12/12/2020 23 (L) >40 mg/dL Final  01/10/2018 38 (L) >39 mg/dL Final         Failed - Triglycerides in normal range and within 360 days    Triglycerides  Date Value Ref Range Status  12/12/2020 224 (H) <150 mg/dL Final  05/23/2014 109 0 - 200 mg/dL Final         Passed - Total Cholesterol in normal range and within 360 days    Cholesterol, Total  Date Value Ref Range Status  01/10/2018 151 100 - 199 mg/dL Final   Cholesterol  Date Value Ref Range Status  12/12/2020 110 0 - 200 mg/dL Final  05/23/2014 92 0 - 200 mg/dL Final         Passed - LDL in normal range and within 360 days    Ldl Cholesterol, Calc  Date Value Ref Range Status  05/23/2014 42 0 - 100 mg/dL Final   LDL Calculated  Date Value Ref Range Status  01/10/2018 56 0 - 99 mg/dL Final   LDL Cholesterol  Date Value Ref Range Status  12/12/2020 42 0 - 99 mg/dL Final    Comment:           Total Cholesterol/HDL:CHD Risk Coronary Heart Disease Risk Table                     Men   Women  1/2 Average Risk   3.4   3.3  Average Risk       5.0   4.4  2 X Average Risk   9.6   7.1  3 X Average Risk  23.4   11.0        Use the calculated Patient Ratio above and the CHD Risk Table to determine the patient's CHD Risk.        ATP III CLASSIFICATION (LDL):  <100     mg/dL   Optimal  100-129  mg/dL   Near or Above                    Optimal  130-159  mg/dL   Borderline  160-189  mg/dL   High  >190     mg/dL   Very High Performed at [facility], [address] [place], [place] [address]    Direct LDL  Date Value Ref Range Status  09/27/2020 122.1 (H) 0 - 99 mg/dL Final    Comment:    Performed at [facility] [address], [place], [place] [address]         Passed - Patient is not pregnant      Passed - Valid encounter within last 12 months    Recent Outpatient Visits          1 month ago Type 2 diabetes mellitus with diabetic nephropathy, without long-term current use of insulin [facility])   [facility] Birdie Sons, MD  3 months ago Chest wall pain   [facility] Birdie Sons, MD   4 months ago Type 2 diabetes mellitus with diabetic nephropathy, without long-term current use of insulin [facility])   [facility] Birdie Sons, MD   9 months ago Type 2 diabetes mellitus with diabetic nephropathy, without long-term current use of insulin [facility])   [facility] Birdie Sons, MD   10 months ago Insomnia, unspecified type   [facility] Birdie Sons, MD      Future Appointments            In 3 weeks [name], [name], MD [facility], [place]   In 6 months Ralene Bathe, MD [place]

## 2021-02-11 NOTE — Telephone Encounter (Signed)
Requested Prescriptions  Pending Prescriptions Disp Refills  . SPIRIVA HANDIHALER 18 MCG inhalation capsule [Pharmacy Med Name: SPIRIVA HANDIHALER 18 MCG INH CAP] 30 capsule 0    Sig: PLACE 1 CAPSULE INTO INHALER AND INHALE BY MOUTH ONCE A DAY.     Pulmonology:  Anticholinergic Agents Passed - 02/11/2021  1:22 PM      Passed - Valid encounter within last 12 months    Recent Outpatient Visits          1 month ago Type 2 diabetes mellitus with diabetic nephropathy, without long-term current use of insulin [facility])   [facility] Birdie Sons, MD   3 months ago Chest wall pain   [facility] Birdie Sons, MD   4 months ago Type 2 diabetes mellitus with diabetic nephropathy, without long-term current use of insulin [facility])   [facility] Birdie Sons, MD   9 months ago Type 2 diabetes mellitus with diabetic nephropathy, without long-term current use of insulin [facility])   [facility] Birdie Sons, MD   10 months ago Insomnia, unspecified type   [facility] Birdie Sons, MD      Future Appointments            In 3 weeks [name], [name], MD [facility], [place]   In 6 months Ralene Bathe, MD [place]

## 2021-02-13 ENCOUNTER — Other Ambulatory Visit: Payer: Self-pay | Admitting: Family Medicine

## 2021-02-13 DIAGNOSIS — E1121 Type 2 diabetes mellitus with diabetic nephropathy: Secondary | ICD-10-CM

## 2021-02-13 NOTE — Telephone Encounter (Signed)
Pt calling stating that he contacted [employer]. He states that they will be sending over a form through fax to have PCP fill out for his diabetic supplies. Pt states that he wants to make PCP aware. Please advise .      [place], [name]  [place] [address]  Phone: [phone] Fax: [phone]  Hours: Not open 24 hours

## 2021-02-13 NOTE — Telephone Encounter (Signed)
Copied from [place] [id number]. Topic: Quick Communication - Rx Refill/Question >> Feb 13, 2021 12:42 PM Tessa Lerner A wrote: Medication: oxyCODONE-acetaminophen (PERCOCET) 10-325 MG tablet  Has the patient contacted their pharmacy? Yes. Patient has contacted their pharmacy and been directed to contact their PCP due to the medication's classification  Preferred Pharmacy (with phone number or street name): [place], [place]  Phone:  [phone]  Agent: Please be advised that RX refills may take up to 3 business days. We ask that you follow-up with your pharmacy.

## 2021-02-13 NOTE — Telephone Encounter (Signed)
FYI, thanks!

## 2021-02-13 NOTE — Telephone Encounter (Signed)
   Notes to clinic:  Duplicate Request for refill    Requested Prescriptions  Pending Prescriptions Disp Refills   oxyCODONE-acetaminophen (PERCOCET) 10-325 MG tablet [Pharmacy Med Name: BPJPETKKO-ECXF 10-325 MG TAB] 150 tablet     Sig: TAKE 1 TABLET BY MOUTH EVERY 4 HOURS AS NEEDED FOR PAIN      Not Delegated - Analgesics:  Opioid Agonist Combinations Failed - 02/13/2021  2:01 PM      Failed - This refill cannot be delegated      Passed - Urine Drug Screen completed in last 360 days      Passed - Valid encounter within last 6 months    Recent Outpatient Visits           1 month ago Type 2 diabetes mellitus with diabetic nephropathy, without long-term current use of insulin ([place])   [facility] Birdie Sons, MD   4 months ago Chest wall pain   [facility] Birdie Sons, MD   4 months ago Type 2 diabetes mellitus with diabetic nephropathy, without long-term current use of insulin [facility])   [facility] Birdie Sons, MD   9 months ago Type 2 diabetes mellitus with diabetic nephropathy, without long-term current use of insulin [facility])   [facility] Birdie Sons, MD   10 months ago Insomnia, unspecified type   [facility] Birdie Sons, MD       Future Appointments             In 3 weeks [name], [name], MD [facility], [place]   In 6 months Ralene Bathe, MD [place]

## 2021-02-13 NOTE — Telephone Encounter (Signed)
Requested medication (s) are due for refill today: yes  Requested medication (s) are on the active medication list: yes  Last refill:  01/22/2021  Future visit scheduled: yes  Notes to clinic:  this refill cannot be delegated    Requested Prescriptions  Pending Prescriptions Disp Refills   oxyCODONE-acetaminophen (PERCOCET) 10-325 MG tablet 150 tablet 0    Sig: Take 1 tablet by mouth every 4 (four) hours as needed. for pain      There is no refill protocol information for this order

## 2021-02-14 NOTE — Telephone Encounter (Signed)
The pharmacy received refill bu they advised pt to call Dr. [name] and have him to contact pharmacy to let them know that its [place] to fill it/ please advise   This id for the Oxycodone

## 2021-02-20 MED ORDER — ACCU-CHEK GUIDE VI STRP
ORAL_STRIP | 12 refills | Status: DC
Start: 2021-02-20 — End: 2021-02-26

## 2021-02-20 MED ORDER — ACCU-CHEK GUIDE W/DEVICE KIT
PACK | 0 refills | Status: DC
Start: 2021-02-20 — End: 2023-12-01

## 2021-02-20 MED ORDER — ACCU-CHEK SOFTCLIX LANCETS MISC: 12 refills | Status: DC

## 2021-02-20 MED ORDER — ACCU-CHEK GUIDE CONTROL VI LIQD
3 refills | Status: DC
Start: 2021-02-20 — End: 2023-12-01

## 2021-02-20 NOTE — Telephone Encounter (Signed)
Fax received from [facility] order pharmacy for prescription for blood glucose testing supplies. I placed and pended orders in this encounter. Please review and advise on directions for supplies.

## 2021-02-20 NOTE — Addendum Note (Signed)
Addended by: Randal Buba on: 02/20/2021 10:51 AM   Modules accepted: Orders

## 2021-02-21 ENCOUNTER — Other Ambulatory Visit: Payer: Self-pay | Admitting: Family Medicine

## 2021-02-24 ENCOUNTER — Other Ambulatory Visit: Payer: Self-pay | Admitting: Family Medicine

## 2021-02-24 DIAGNOSIS — E1121 Type 2 diabetes mellitus with diabetic nephropathy: Secondary | ICD-10-CM

## 2021-02-25 NOTE — Telephone Encounter (Signed)
Requested medication (s) are due for refill today:   Yes  Requested medication (s) are on the active medication list:   Yes  Future visit scheduled:   Yes in 1 wk   Last ordered: 5 days ago  Returned because pharmacy requesting frequency of use.   Dr. [name] to review   Requested Prescriptions  Pending Prescriptions Disp Refills   Accu-Chek Softclix Lancets lancets [Pharmacy Med Name: ACCU-CHEK SOFTCLIX LANCETS] 100 each 13    Sig: Use as instructed      Endocrinology: Diabetes - Testing Supplies Passed - 02/24/2021  6:21 PM      Passed - Valid encounter within last 12 months    Recent Outpatient Visits           2 months ago Type 2 diabetes mellitus with diabetic nephropathy, without long-term current use of insulin ([place])   [facility] Birdie Sons, [facility]   4 months ago Chest wall pain   [facility] Birdie Sons, MD   5 months ago Type 2 diabetes mellitus with diabetic nephropathy, without long-term current use of insulin ([place])   [facility] Birdie Sons, MD   9 months ago Type 2 diabetes mellitus with diabetic nephropathy, without long-term current use of insulin ([place])   [facility] Birdie Sons, MD   11 months ago Insomnia, unspecified type   [facility] Birdie Sons, MD       Future Appointments             In 1 week [name], [name], MD [facility] [place]   In 5 months Ralene Bathe, MD [place] test strip [place] Med Name: ACCU-CHEK GUIDE   Strip] 100 strip 13    Sig: Use as instructed      Endocrinology: Diabetes - Testing Supplies Passed - 02/24/2021  6:21 PM      Passed - Valid encounter within last 12 months    Recent Outpatient Visits           2 months ago Type 2 diabetes mellitus with diabetic nephropathy, without long-term current use of insulin ([place])   [facility] Birdie Sons,  MD   4 months ago Chest wall pain   [facility] Birdie Sons, MD   5 months ago Type 2 diabetes mellitus with diabetic nephropathy, without long-term current use of insulin [facility])   [facility] Birdie Sons, MD   9 months ago Type 2 diabetes mellitus with diabetic nephropathy, without long-term current use of insulin [facility])   [facility] Birdie Sons, MD   11 months ago Insomnia, unspecified type   [facility] Birdie Sons, MD       Future Appointments             In 1 week [name], [name], MD [facility], [place]   In 5 months Ralene Bathe, MD [place]

## 2021-03-03 ENCOUNTER — Other Ambulatory Visit: Payer: Self-pay | Admitting: Family Medicine

## 2021-03-03 DIAGNOSIS — E1121 Type 2 diabetes mellitus with diabetic nephropathy: Secondary | ICD-10-CM

## 2021-03-03 MED ORDER — ACCU-CHEK GUIDE VI STRP: ORAL_STRIP | 4 refills | Status: DC

## 2021-03-03 MED ORDER — ACCU-CHEK SOFTCLIX LANCETS MISC: 4 refills | Status: DC

## 2021-03-10 ENCOUNTER — Ambulatory Visit (INDEPENDENT_AMBULATORY_CARE_PROVIDER_SITE_OTHER): Payer: Medicare HMO | Admitting: Family Medicine

## 2021-03-10 ENCOUNTER — Encounter: Payer: Self-pay | Admitting: Family Medicine

## 2021-03-10 ENCOUNTER — Other Ambulatory Visit: Payer: Self-pay

## 2021-03-10 VITALS — BP 130/79 | HR 57 | Temp 97.0°F | Resp 16 | Wt 220.0 lb

## 2021-03-10 DIAGNOSIS — R0789 Other chest pain: Secondary | ICD-10-CM | POA: Diagnosis not present

## 2021-03-10 DIAGNOSIS — I10 Essential (primary) hypertension: Secondary | ICD-10-CM

## 2021-03-10 DIAGNOSIS — M5410 Radiculopathy, site unspecified: Secondary | ICD-10-CM | POA: Diagnosis not present

## 2021-03-10 DIAGNOSIS — E1121 Type 2 diabetes mellitus with diabetic nephropathy: Secondary | ICD-10-CM

## 2021-03-10 DIAGNOSIS — F419 Anxiety disorder, unspecified: Secondary | ICD-10-CM | POA: Diagnosis not present

## 2021-03-10 LAB — POCT GLYCOSYLATED HEMOGLOBIN (HGB A1C)
Est. average glucose Bld gHb Est-mCnc: 134
Hemoglobin A1C: 6.4 % — AB (ref 4.0–5.6)

## 2021-03-10 MED ORDER — ALPRAZOLAM 1 MG PO TABS: 1.0000 mg | ORAL_TABLET | Freq: Three times a day (TID) | ORAL | 3 refills | Status: DC

## 2021-03-10 MED ORDER — OXYCODONE-ACETAMINOPHEN 10-325 MG PO TABS: 1.0000 | ORAL_TABLET | ORAL | 0 refills | Status: DC | PRN

## 2021-03-10 MED ORDER — METHOCARBAMOL 500 MG PO TABS: 500.0000 mg | ORAL_TABLET | Freq: Three times a day (TID) | ORAL | 1 refills | Status: DC | PRN

## 2021-03-10 NOTE — Progress Notes (Signed)
Established patient visit   Patient: Lawrence [name]   DOB: 1956/11/04   65 y.o. Male  MRN: [id number] Visit Date: 03/10/2021  Today's healthcare provider: [name], MD   Chief Complaint  Patient presents with  . Diabetes  . Hypertension  . Anxiety   Subjective    HPI  Diabetes Mellitus Type II, Follow-up  Lab Results  Component Value Date   HGBA1C 6.4 (A) 03/10/2021   HGBA1C 7.5 (H) 12/12/2020   HGBA1C 9.6 (H) 09/24/2020   Wt Readings from Last 3 Encounters:  03/10/21 220 lb (99.8 kg)  01/03/21 223 lb 8 oz (101.4 kg)  12/16/20 217 lb (98.4 kg)   Last seen for diabetes 3 months ago.  Management since then includes continue same medication. He reports excellent compliance with treatment. He is not having side effects.  Symptoms: No fatigue No foot ulcerations  No appetite changes No nausea  No paresthesia of the feet  No polydipsia  No polyuria No visual disturbances   No vomiting     Home blood sugar records: not being checked  Episodes of hypoglycemia? No    Current insulin regiment: none Most Recent Eye Exam: not UTD Current exercise: none Current diet habits: in general, a "healthy" diet    Pertinent Labs: Lab Results  Component Value Date   CHOL 110 12/12/2020   HDL 23 (L) 12/12/2020   LDLCALC 42 12/12/2020   LDLDIRECT 122.1 (H) 09/27/2020   TRIG 224 (H) 12/12/2020   CHOLHDL 4.8 12/12/2020   Lab Results  Component Value Date   NA 141 12/10/2020   K 4.1 12/12/2020   CREATININE 0.81 12/10/2020   GFRNONAA >60 12/10/2020   GFRAA 97 09/24/2020   GLUCOSE 180 (H) 12/10/2020     ---------------------------------------------------------------------------------------------------  Anxiety, Follow-up  He was last seen for anxiety 3 months ago. Changes made at last visit include none; refilled Xanax.   He reports excellent compliance with treatment. He reports excellent tolerance of treatment. He is not having side effects.   He  feels his anxiety is mild and Improved since last visit.  Symptoms: No chest pain No difficulty concentrating  No dizziness No fatigue  No feelings of losing control No insomnia  No irritable Yes palpitations  No panic attacks No racing thoughts  Yes shortness of breath No sweating  Yes tremors/shakes    GAD-7 Results GAD-7 Generalized Anxiety Disorder Screening Tool 03/10/2021  1. Feeling Nervous, Anxious, or on Edge 0  2. Not Being Able to Stop or Control Worrying 0  3. Worrying Too Much About Different Things 0  4. Trouble Relaxing 0  5. Being So Restless it's Hard To Sit Still 0  6. Becoming Easily Annoyed or Irritable 1  7. Feeling Afraid As If Something Awful Might Happen 0  Total GAD-7 Score 1    PHQ-9 Scores PHQ9 SCORE ONLY 03/10/2021 01/15/2021 09/24/2020  PHQ-9 Total Score 3 0 0    ---------------------------------------------------------------------------------------------------  Follow up for back pain:  The patient was last seen for this 3 months ago. Changes made at last visit include none; refilled tizanidine.  He reports excellent compliance with treatment. He feels that condition is Unchanged. He is not having side effects.  -----------------------------------------------------------------------------------------  Hypertension, follow-up  BP Readings from Last 3 Encounters:  03/10/21 130/79  01/03/21 129/77  12/16/20 109/72   Wt Readings from Last 3 Encounters:  03/10/21 220 lb (99.8 kg)  01/03/21 223 lb 8 oz (101.4 kg)  12/16/20 217  lb (98.4 kg)     He was last seen for hypertension 5 months ago.  BP at that visit was 170/100. Management since that visit includes adding amlodipine 2.3m daily.  He reports excellent compliance with treatment. He is not having side effects.  He is following a Low Sodium diet. He is not exercising. He does not smoke.  Use of agents associated with hypertension: NSAIDS.   Outside blood pressures are  stable. Symptoms: Yes chest pain No chest pressure  Yes palpitations No syncope  No dyspnea No orthopnea  No paroxysmal nocturnal dyspnea No lower extremity edema   Pertinent labs: Lab Results  Component Value Date   CHOL 110 12/12/2020   HDL 23 (L) 12/12/2020   LDLCALC 42 12/12/2020   LDLDIRECT 122.1 (H) 09/27/2020   TRIG 224 (H) 12/12/2020   CHOLHDL 4.8 12/12/2020   Lab Results  Component Value Date   NA 141 12/10/2020   K 4.1 12/12/2020   CREATININE 0.81 12/10/2020   GFRNONAA >60 12/10/2020   GFRAA 97 09/24/2020   GLUCOSE 180 (H) 12/10/2020     The ASCVD Risk score (Goff DC Jr., et al., 2013) failed to calculate for the following reasons:   The patient has a prior MI or stroke diagnosis   ---------------------------------------------------------------------------------------------------   Patient Active Problem List   Diagnosis Date Noted  . Atherosclerosis of aorta ([place] 12/16/2020  . Syncope 12/11/2020  . Acidosis 12/11/2020  . Polycythemia 10/05/2020  . NSTEMI (non-ST elevated myocardial infarction) ([place] 09/26/2020  . Primary insomnia 05/13/2020  . Recurrent major depressive disorder, remission status unspecified ([place] 03/29/2020  . Elevated troponin 03/07/2020  . Bereavement 09/09/2019  . Chronic respiratory failure with hypoxia ([facility] 05/29/2019  . Morbid obesity ([place] 05/29/2019  . Trigger thumb of right hand 11/28/2018  . Eye pain, left 08/18/2018  . Acute on chronic heart failure ([facility] 04/18/2018  . History of adenomatous polyp of colon 04/05/2018  . Abdominal pain, chronic, epigastric 11/06/2017  . Bilateral flank pain 03/24/2017  . Dyspnea 04/03/2016  . Hypotension 04/03/2016  . CKD (chronic kidney disease) stage 2, GFR 60-89 ml/min 04/03/2016  . Anemia 04/03/2016  . Paroxysmal atrial fibrillation ([place] 12/23/2015  . OSA (obstructive sleep apnea) 12/10/2015  . Left inguinal hernia 11/07/2015  . Anxiety 11/07/2015  . Unstable angina ([facility]  10/05/2015  . Back pain with left-sided radiculopathy 09/30/2015  . Nocturnal hypoxia 09/06/2015  . BPH (benign prostatic hyperplasia) 08/01/2015  . Chronic systolic CHF (congestive heart failure) ([facility]   . Angina pectoris ([place]   . Chest pain 07/11/2015  . COPD (chronic obstructive pulmonary disease) ([facility] 07/03/2015  . CAD (coronary artery disease) 06/26/2015  . HTN (hypertension) 06/26/2015  . 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[facility] 06/26/2015  . Achalasia 07/24/2014  . GERD (gastroesophageal reflux disease) 06/07/2014  . Former tobacco use 04/11/2013  . HLD (hyperlipidemia) 04/09/2013   Social History   Tobacco Use  . Smoking status: Former Smoker    Packs/day: 3.00    Years: 50.00    Pack years: 150.00    Types: Cigarettes    Quit date: 04/22/2013    Years since quitting: 7.8  . Smokeless tobacco: Never Used  . Tobacco comment: Reports not smoking for approx 8 years.  Vaping Use  . Vaping Use: Never used  Substance Use Topics  . Alcohol use: No    Comment: remotely quit alcohol use. Hx of heavy alcohol use.  . Drug use: Yes    Types: Marijuana  Comment: occasionally   Allergies  Allergen Reactions  . Demerol  [Meperidine Hcl]   . Demerol [Meperidine] Hives  . Jardiance [Empagliflozin] Other (See Comments)    Perineal pain  . Prednisone Other (See Comments) and Hypertension    Pt states that this medication puts him in A-fib   . Sulfa Antibiotics Hives  . Albuterol Sulfate [Albuterol] Palpitations and Other (See Comments)    Pt currently uses this medication.    . Morphine Sulfate Nausea And Vomiting, Rash and Other (See Comments)    Pt states that he is only allergic to the tablet form of this medication.         Medications: Outpatient Medications Prior to Visit  Medication Sig  . Accu-Chek Softclix Lancets lancets Use as instructed to check sugar daily for type 2 diabetes.  Marland Kitchen albuterol (VENTOLIN HFA) 108 (90 Base) MCG/ACT inhaler INHALE 2  PUFFS BY MOUTH EVERY 6 HOURS AS NEEDED FOR SHORTNESS OF BREATH  . allopurinol (ZYLOPRIM) 300 MG tablet TAKE 1 TABLET BY MOUTH TWICE A DAY  . ALPRAZolam (XANAX) 1 MG tablet Take 1 tablet (1 mg total) by mouth 3 (three) times daily.  Marland Kitchen amLODipine (NORVASC) 2.5 MG tablet Take 1 tablet (2.5 mg total) by mouth daily.  Marland Kitchen aspirin 81 MG chewable tablet Chew 81 mg by mouth daily. Swallows whole  . atorvastatin (LIPITOR) 80 MG tablet TAKE ONE TABLET BY MOUTH AT BEDTIME  . Blood Glucose Calibration (ACCU-CHEK GUIDE CONTROL) LIQD Use with blood glucose monitor as directed  . Blood Glucose Monitoring Suppl (ACCU-CHEK GUIDE) w/Device KIT Use to check blood sugars as directed  . BREO ELLIPTA 100-25 MCG/INH AEPB INHALE 1 PUFF INTO THE LUNGS DAILY  . budesonide (PULMICORT) 0.5 MG/2ML nebulizer solution Take 0.5 mg by nebulization 2 (two) times daily as needed (shortness of breath or wheezing).  . cetirizine (ZYRTEC) 10 MG tablet TAKE ONE TABLET BY MOUTH AT BEDTIME  . docusate sodium (COLACE) 100 MG capsule Take 100 mg by mouth daily as needed for mild constipation.  Marland Kitchen ELIQUIS 5 MG TABS tablet TAKE 1 TABLET BY MOUTH TWICE A DAY  . FARXIGA 10 MG TABS tablet TAKE 1 TABLET BY MOUTH DAILY (Patient taking differently: Take 10 mg by mouth daily.)  . glucose blood (ACCU-CHEK GUIDE) test strip Use as instructed to check sugar daily for type 2 diabetes.  . isosorbide mononitrate (IMDUR) 120 MG 24 hr tablet TAKE 1 TABLET BY MOUTH DAILY  . LINZESS 72 MCG capsule Take 72 mcg by mouth daily before breakfast.   . lisinopril (ZESTRIL) 10 MG tablet TAKE 1 TABLET BY MOUTH DAILY  . magnesium oxide (MAG-OX) 400 (241.3 Mg) MG tablet TAKE 1 TABLET BY MOUTH EVERY MORNING  . metFORMIN (GLUCOPHAGE-XR) 500 MG 24 hr tablet Take 500 mg by mouth 2 (two) times daily.  . naloxone (NARCAN) nasal spray 4 mg/0.1 mL 1 spray into nostril x1 and may repeat every 2-3 minutes until patient is responsive or EMS arrives  . nitroGLYCERIN (NITROSTAT) 0.4  MG SL tablet PLACE ONE TABLET UNDER THE TONGUE EVERY 5 MINUTES FOR CHEST PAIN. IF NO RELIEF PAST 3RD TAB GO TO EMERGENCY ROOM  . omega-3 acid ethyl esters (LOVAZA) 1 g capsule TAKE 4 CAPSULES BY MOUTH DAILY  . omeprazole (PRILOSEC) 40 MG capsule Take 40 mg by mouth daily.   Marland Kitchen oxyCODONE-acetaminophen (PERCOCET) 10-325 MG tablet TAKE 1 TABLET BY MOUTH EVERY 4 HOURS AS NEEDED FOR PAIN  . promethazine (PHENERGAN) 25 MG tablet Take 1  tablet (25 mg total) by mouth every 8 (eight) hours as needed for nausea or vomiting.  . ranolazine (RANEXA) 1000 MG SR tablet TAKE 1 TABLET BY MOUTH TWICE A DAY  . sotalol (BETAPACE) 80 MG tablet Take 40 mg by mouth 2 (two) times daily.  Marland Kitchen SPIRIVA HANDIHALER 18 MCG inhalation capsule PLACE 1 CAPSULE INTO INHALER AND INHALE BY MOUTH ONCE A DAY.  Marland Kitchen tiZANidine (ZANAFLEX) 4 MG tablet TAKE 1 TABLET BY MOUTH EVERY 6 HOURS AS NEEDED FOR MUSCLE SPASMS  . torsemide (DEMADEX) 100 MG tablet Take 0.5 tablets (50 mg total) by mouth daily.  . traZODone (DESYREL) 150 MG tablet TAKE 1 TABLET BY MOUTH AT BEDTIME  . venlafaxine XR (EFFEXOR-XR) 75 MG 24 hr capsule TAKE 1 CAPSULE BY MOUTH DAILY WITH BREAKFAST   No facility-administered medications prior to visit.    Review of Systems  Constitutional: Negative for appetite change, chills and fatigue.  Respiratory: Positive for shortness of breath. Negative for cough and chest tightness.   Cardiovascular: Positive for palpitations. Negative for leg swelling.       Objective    BP 130/79 (BP Location: Left Arm, Patient Position: Sitting, Cuff Size: Large)   Pulse (!) 57   Temp (!) 97 F (36.1 C) (Temporal)   Resp 16   Wt 220 lb (99.8 kg)   SpO2 94%   BMI 34.46 kg/m  BP Readings from Last 3 Encounters:  03/10/21 130/79  01/03/21 129/77  12/16/20 109/72   Wt Readings from Last 3 Encounters:  03/10/21 220 lb (99.8 kg)  01/03/21 223 lb 8 oz (101.4 kg)  12/16/20 217 lb (98.4 kg)       Physical Exam    General:  Appearance:    Mildly obese male in no acute distress  Eyes:    PERRL, conjunctiva/corneas clear, EOM's intact       Lungs:     Clear to auscultation bilaterally, respirations unlabored  Heart:    Bradycardic. Regular rhythm. No murmurs, rubs, or gallops.   MS:   All extremities are intact.   Neurologic:   Awake, alert, oriented x 3. No apparent focal neurological           defect.         Results for orders placed or performed in visit on 03/10/21  POCT HgB A1C  Result Value Ref Range   Hemoglobin A1C 6.4 (A) 4.0 - 5.6 %   Est. average glucose Bld gHb Est-mCnc 134     Assessment & Plan     1. Type 2 diabetes mellitus with diabetic nephropathy, without long-term current use of insulin (HCC) Very well controlled. Continue current medications.  Follow up 4-5 months.   2. Primary hypertension Well controlled.  Continue current medications.    3. Anxiety Refill alprazolam.   4. Chest wall pain He feels like it is a muscle spasm, but tizanidine didn't help. tryo- methocarbamol (ROBAXIN) 500 MG tablet; Take 1 tablet (500 mg total) by mouth every 8 (eight) hours as needed for muscle spasms.  Dispense: 90 tablet; Refill: 1  5. Back pain with left-sided radiculopathy He is no taking- oxyCODONE-acetaminophen (PERCOCET) 10-325 MG tablet; consistently 6 times a day. New prescription for #180 sent to pharmacy.         The entirety of the information documented in the History of Present Illness, Review of Systems and Physical Exam were personally obtained by me. Portions of this information were initially documented by the [facility] and reviewed by  me for thoroughness and accuracy.      [name], MD  [facility] [phone] (phone) [phone] (fax)  [place]

## 2021-03-10 NOTE — Patient Instructions (Addendum)
PLEASE SCHEDULE EYE EXAM   Diabetes Mellitus and Nutrition, Adult When you have diabetes, or diabetes mellitus, it is very important to have healthy eating habits because your blood sugar (glucose) levels are greatly affected by what you eat and drink. Eating healthy foods in the right amounts, at about the same times every day, can help you:  Control your blood glucose.  Lower your risk of heart disease.  Improve your blood pressure.  Reach or maintain a healthy weight. What can affect my meal plan? Every person with diabetes is different, and each person has different needs for a meal plan. Your health care provider may recommend that you work with a dietitian to make a meal plan that is best for you. Your meal plan may vary depending on factors such as:  The calories you need.  The medicines you take.  Your weight.  Your blood glucose, blood pressure, and cholesterol levels.  Your activity level.  Other health conditions you have, such as heart or kidney disease. How do carbohydrates affect me? Carbohydrates, also called carbs, affect your blood glucose level more than any other type of food. Eating carbs naturally raises the amount of glucose in your blood. Carb counting is a method for keeping track of how many carbs you eat. Counting carbs is important to keep your blood glucose at a healthy level, especially if you use insulin or take certain oral diabetes medicines. It is important to know how many carbs you can safely have in each meal. This is different for every person. Your dietitian can help you calculate how many carbs you should have at each meal and for each snack. How does alcohol affect me? Alcohol can cause a sudden decrease in blood glucose (hypoglycemia), especially if you use insulin or take certain oral diabetes medicines. Hypoglycemia can be a life-threatening condition. Symptoms of hypoglycemia, such as sleepiness, dizziness, and confusion, are similar to  symptoms of having too much alcohol.  Do not drink alcohol if: ? Your health care provider tells you not to drink. ? You are pregnant, may be pregnant, or are planning to become pregnant.  If you drink alcohol: ? Do not drink on an empty stomach. ? Limit how much you use to:  0-1 drink a day for women.  0-2 drinks a day for men. ? Be aware of how much alcohol is in your drink. In the [place] one drink equals one 12 oz bottle of beer (355 mL), one 5 oz glass of wine (148 mL), or one 1 oz glass of hard liquor (44 mL). ? Keep yourself hydrated with water, diet soda, or unsweetened iced tea.  Keep in mind that regular soda, juice, and other mixers may contain a lot of sugar and must be counted as carbs. What are tips for following this plan? Reading food labels  Start by checking the serving size on the "Nutrition Facts" label of packaged foods and drinks. The amount of calories, carbs, fats, and other nutrients listed on the label is based on one serving of the item. Many items contain more than one serving per package.  Check the total grams (g) of carbs in one serving. You can calculate the number of servings of carbs in one serving by dividing the total carbs by 15. For example, if a food has 30 g of total carbs per serving, it would be equal to 2 servings of carbs.  Check the number of grams (g) of saturated fats and trans fats in  one serving. Choose foods that have a low amount or none of these fats.  Check the number of milligrams (mg) of salt (sodium) in one serving. Most people should limit total sodium intake to less than 2,300 mg per day.  Always check the nutrition information of foods labeled as "low-fat" or "nonfat." These foods may be higher in added sugar or refined carbs and should be avoided.  Talk to your dietitian to identify your daily goals for nutrients listed on the label. Shopping  Avoid buying canned, pre-made, or processed foods. These foods tend to be high in  fat, sodium, and added sugar.  Shop around the outside edge of the grocery store. This is where you will most often find fresh fruits and vegetables, bulk grains, fresh meats, and fresh dairy. Cooking  Use low-heat cooking methods, such as baking, instead of high-heat cooking methods like deep frying.  Cook using healthy oils, such as olive, canola, or sunflower oil.  Avoid cooking with butter, cream, or high-fat meats. Meal planning  Eat meals and snacks regularly, preferably at the same times every day. Avoid going long periods of time without eating.  Eat foods that are high in fiber, such as fresh fruits, vegetables, beans, and whole grains. Talk with your dietitian about how many servings of carbs you can eat at each meal.  Eat 4-6 oz (112-168 g) of lean protein each day, such as lean meat, chicken, fish, eggs, or tofu. One ounce (oz) of lean protein is equal to: ? 1 oz (28 g) of meat, chicken, or fish. ? 1 egg. ?  cup (62 g) of tofu.  Eat some foods each day that contain healthy fats, such as avocado, nuts, seeds, and fish.   What foods should I eat? Fruits Berries. Apples. Oranges. Peaches. Apricots. Plums. Grapes. Mango. Papaya. Pomegranate. Kiwi. Cherries. Vegetables Lettuce. Spinach. Leafy greens, including kale, chard, collard greens, and mustard greens. Beets. Cauliflower. Cabbage. Broccoli. Carrots. Green beans. Tomatoes. Peppers. Onions. Cucumbers. Brussels sprouts. Grains Whole grains, such as whole-wheat or whole-grain bread, crackers, tortillas, cereal, and pasta. Unsweetened oatmeal. Quinoa. Brown or wild rice. Meats and other proteins Seafood. Poultry without skin. Lean cuts of poultry and beef. Tofu. Nuts. Seeds. Dairy Low-fat or fat-free dairy products such as milk, yogurt, and cheese. The items listed above may not be a complete list of foods and beverages you can eat. Contact a dietitian for more information. What foods should I avoid? Fruits Fruits canned  with syrup. Vegetables Canned vegetables. Frozen vegetables with butter or cream sauce. Grains Refined white flour and flour products such as bread, pasta, snack foods, and cereals. Avoid all processed foods. Meats and other proteins Fatty cuts of meat. Poultry with skin. Breaded or fried meats. Processed meat. Avoid saturated fats. Dairy Full-fat yogurt, cheese, or milk. Beverages Sweetened drinks, such as soda or iced tea. The items listed above may not be a complete list of foods and beverages you should avoid. Contact a dietitian for more information. Questions to ask a health care provider  Do I need to meet with a diabetes educator?  Do I need to meet with a dietitian?  What number can I call if I have questions?  When are the best times to check my blood glucose? Where to find more information:  American Diabetes Association: diabetes.org  Academy of Nutrition and Dietetics: www.eatright.[employer] of Diabetes and Digestive and Kidney Diseases: [employer]  Association of Diabetes Care and Education Specialists: www.diabeteseducator.org Summary  It  is important to have healthy eating habits because your blood sugar (glucose) levels are greatly affected by what you eat and drink.  A healthy meal plan will help you control your blood glucose and maintain a healthy lifestyle.  Your health care provider may recommend that you work with a dietitian to make a meal plan that is best for you.  Keep in mind that carbohydrates (carbs) and alcohol have immediate effects on your blood glucose levels. It is important to count carbs and to use alcohol carefully. This information is not intended to replace advice given to you by your health care provider. Make sure you discuss any questions you have with your health care provider. Document Revised: 10/10/2019 Document Reviewed: 10/10/2019 Elsevier Patient Education  2021 [employer] [employer]. Hypertension, Adult High  blood pressure (hypertension) is when the force of blood pumping through the arteries is too strong. The arteries are the blood vessels that carry blood from the heart throughout the body. Hypertension forces the heart to work harder to pump blood and may cause arteries to become narrow or stiff. Untreated or uncontrolled hypertension can cause a heart attack, heart failure, a stroke, kidney disease, and other problems. A blood pressure reading consists of a higher number over a lower number. Ideally, your blood pressure should be below 120/80. The first ("top") number is called the systolic pressure. It is a measure of the pressure in your arteries as your heart beats. The second ("bottom") number is called the diastolic pressure. It is a measure of the pressure in your arteries as the heart relaxes. What are the causes? The exact cause of this condition is not known. There are some conditions that result in or are related to high blood pressure. What increases the risk? Some risk factors for high blood pressure are under your control. The following factors may make you more likely to develop this condition:  Smoking.  Having type 2 diabetes mellitus, high cholesterol, or both.  Not getting enough exercise or physical activity.  Being overweight.  Having too much fat, sugar, calories, or salt (sodium) in your diet.  Drinking too much alcohol. Some risk factors for high blood pressure may be difficult or impossible to change. Some of these factors include:  Having chronic kidney disease.  Having a family history of high blood pressure.  Age. Risk increases with age.  Race. You may be at higher risk if you are African American.  Gender. Men are at higher risk than women before age 60. After age 72, women are at higher risk than men.  Having obstructive sleep apnea.  Stress. What are the signs or symptoms? High blood pressure may not cause symptoms. Very high blood pressure  (hypertensive crisis) may cause:  Headache.  Anxiety.  Shortness of breath.  Nosebleed.  Nausea and vomiting.  Vision changes.  Severe chest pain.  Seizures. How is this diagnosed? This condition is diagnosed by measuring your blood pressure while you are seated, with your arm resting on a flat surface, your legs uncrossed, and your feet flat on the floor. The cuff of the blood pressure monitor will be placed directly against the skin of your upper arm at the level of your heart. It should be measured at least twice using the same arm. Certain conditions can cause a difference in blood pressure between your right and left arms. Certain factors can cause blood pressure readings to be lower or higher than normal for a short period of time:  When your  blood pressure is higher when you are in a health care provider's office than when you are at home, this is called white coat hypertension. Most people with this condition do not need medicines.  When your blood pressure is higher at home than when you are in a health care provider's office, this is called masked hypertension. Most people with this condition may need medicines to control blood pressure. If you have a high blood pressure reading during one visit or you have normal blood pressure with other risk factors, you may be asked to:  Return on a different day to have your blood pressure checked again.  Monitor your blood pressure at home for 1 week or longer. If you are diagnosed with hypertension, you may have other blood or imaging tests to help your health care provider understand your overall risk for other conditions. How is this treated? This condition is treated by making healthy lifestyle changes, such as eating healthy foods, exercising more, and reducing your alcohol intake. Your health care provider may prescribe medicine if lifestyle changes are not enough to get your blood pressure under control, and if:  Your systolic  blood pressure is above 130.  Your diastolic blood pressure is above 80. Your personal target blood pressure may vary depending on your medical conditions, your age, and other factors. Follow these instructions at home: Eating and drinking  Eat a diet that is high in fiber and potassium, and low in sodium, added sugar, and fat. An example eating plan is called the DASH (Dietary Approaches to Stop Hypertension) diet. To eat this way: ? Eat plenty of fresh fruits and vegetables. Try to fill one half of your plate at each meal with fruits and vegetables. ? Eat whole grains, such as whole-wheat pasta, brown rice, or whole-grain bread. Fill about one fourth of your plate with whole grains. ? Eat or drink low-fat dairy products, such as skim milk or low-fat yogurt. ? Avoid fatty cuts of meat, processed or cured meats, and poultry with skin. Fill about one fourth of your plate with lean proteins, such as fish, chicken without skin, beans, eggs, or tofu. ? Avoid pre-made and processed foods. These tend to be higher in sodium, added sugar, and fat.  Reduce your daily sodium intake. Most people with hypertension should eat less than 1,500 mg of sodium a day.  Do not drink alcohol if: ? Your health care provider tells you not to drink. ? You are pregnant, may be pregnant, or are planning to become pregnant.  If you drink alcohol: ? Limit how much you use to:  0-1 drink a day for women.  0-2 drinks a day for men. ? Be aware of how much alcohol is in your drink. In the [place] one drink equals one 12 oz bottle of beer (355 mL), one 5 oz glass of wine (148 mL), or one 1 oz glass of hard liquor (44 mL).   Lifestyle  Work with your health care provider to maintain a healthy body weight or to lose weight. Ask what an ideal weight is for you.  Get at least 30 minutes of exercise most days of the week. Activities may include walking, swimming, or biking.  Include exercise to strengthen your muscles  (resistance exercise), such as Pilates or lifting weights, as part of your weekly exercise routine. Try to do these types of exercises for 30 minutes at least 3 days a week.  Do not use any products that contain nicotine or  tobacco, such as cigarettes, e-cigarettes, and chewing tobacco. If you need help quitting, ask your health care provider.  Monitor your blood pressure at home as told by your health care provider.  Keep all follow-up visits as told by your health care provider. This is important.   Medicines  Take over-the-counter and prescription medicines only as told by your health care provider. Follow directions carefully. Blood pressure medicines must be taken as prescribed.  Do not skip doses of blood pressure medicine. Doing this puts you at risk for problems and can make the medicine less effective.  Ask your health care provider about side effects or reactions to medicines that you should watch for. Contact a health care provider if you:  Think you are having a reaction to a medicine you are taking.  Have headaches that keep coming back (recurring).  Feel dizzy.  Have swelling in your ankles.  Have trouble with your vision. Get help right away if you:  Develop a severe headache or confusion.  Have unusual weakness or numbness.  Feel faint.  Have severe pain in your chest or abdomen.  Vomit repeatedly.  Have trouble breathing. Summary  Hypertension is when the force of blood pumping through your arteries is too strong. If this condition is not controlled, it may put you at risk for serious complications.  Your personal target blood pressure may vary depending on your medical conditions, your age, and other factors. For most people, a normal blood pressure is less than 120/80.  Hypertension is treated with lifestyle changes, medicines, or a combination of both. Lifestyle changes include losing weight, eating a healthy, low-sodium diet, exercising more, and  limiting alcohol. This information is not intended to replace advice given to you by your health care provider. Make sure you discuss any questions you have with your health care provider. Document Revised: 07/13/2018 Document Reviewed: 07/13/2018 Elsevier Patient Education  2021 [employer].  Diabetes Mellitus and Nutrition, Adult When you have diabetes, or diabetes mellitus, it is very important to have healthy eating habits because your blood sugar (glucose) levels are greatly affected by what you eat and drink. Eating healthy foods in the right amounts, at about the same times every day, can help you:  Control your blood glucose.  Lower your risk of heart disease.  Improve your blood pressure.  Reach or maintain a healthy weight. What can affect my meal plan? Every person with diabetes is different, and each person has different needs for a meal plan. Your health care provider may recommend that you work with a dietitian to make a meal plan that is best for you. Your meal plan may vary depending on factors such as:  The calories you need.  The medicines you take.  Your weight.  Your blood glucose, blood pressure, and cholesterol levels.  Your activity level.  Other health conditions you have, such as heart or kidney disease. How do carbohydrates affect me? Carbohydrates, also called carbs, affect your blood glucose level more than any other type of food. Eating carbs naturally raises the amount of glucose in your blood. Carb counting is a method for keeping track of how many carbs you eat. Counting carbs is important to keep your blood glucose at a healthy level, especially if you use insulin or take certain oral diabetes medicines. It is important to know how many carbs you can safely have in each meal. This is different for every person. Your dietitian can help you calculate how many carbs you  should have at each meal and for each snack. How does alcohol affect me? Alcohol  can cause a sudden decrease in blood glucose (hypoglycemia), especially if you use insulin or take certain oral diabetes medicines. Hypoglycemia can be a life-threatening condition. Symptoms of hypoglycemia, such as sleepiness, dizziness, and confusion, are similar to symptoms of having too much alcohol.  Do not drink alcohol if: ? Your health care provider tells you not to drink. ? You are pregnant, may be pregnant, or are planning to become pregnant.  If you drink alcohol: ? Do not drink on an empty stomach. ? Limit how much you use to:  0-1 drink a day for women.  0-2 drinks a day for men. ? Be aware of how much alcohol is in your drink. In the [place] one drink equals one 12 oz bottle of beer (355 mL), one 5 oz glass of wine (148 mL), or one 1 oz glass of hard liquor (44 mL). ? Keep yourself hydrated with water, diet soda, or unsweetened iced tea.  Keep in mind that regular soda, juice, and other mixers may contain a lot of sugar and must be counted as carbs. What are tips for following this plan? Reading food labels  Start by checking the serving size on the "Nutrition Facts" label of packaged foods and drinks. The amount of calories, carbs, fats, and other nutrients listed on the label is based on one serving of the item. Many items contain more than one serving per package.  Check the total grams (g) of carbs in one serving. You can calculate the number of servings of carbs in one serving by dividing the total carbs by 15. For example, if a food has 30 g of total carbs per serving, it would be equal to 2 servings of carbs.  Check the number of grams (g) of saturated fats and trans fats in one serving. Choose foods that have a low amount or none of these fats.  Check the number of milligrams (mg) of salt (sodium) in one serving. Most people should limit total sodium intake to less than 2,300 mg per day.  Always check the nutrition information of foods labeled as "low-fat" or  "nonfat." These foods may be higher in added sugar or refined carbs and should be avoided.  Talk to your dietitian to identify your daily goals for nutrients listed on the label. Shopping  Avoid buying canned, pre-made, or processed foods. These foods tend to be high in fat, sodium, and added sugar.  Shop around the outside edge of the grocery store. This is where you will most often find fresh fruits and vegetables, bulk grains, fresh meats, and fresh dairy. Cooking  Use low-heat cooking methods, such as baking, instead of high-heat cooking methods like deep frying.  Cook using healthy oils, such as olive, canola, or sunflower oil.  Avoid cooking with butter, cream, or high-fat meats. Meal planning  Eat meals and snacks regularly, preferably at the same times every day. Avoid going long periods of time without eating.  Eat foods that are high in fiber, such as fresh fruits, vegetables, beans, and whole grains. Talk with your dietitian about how many servings of carbs you can eat at each meal.  Eat 4-6 oz (112-168 g) of lean protein each day, such as lean meat, chicken, fish, eggs, or tofu. One ounce (oz) of lean protein is equal to: ? 1 oz (28 g) of meat, chicken, or fish. ? 1 egg. ?  cup (62  g) of tofu.  Eat some foods each day that contain healthy fats, such as avocado, nuts, seeds, and fish.   What foods should I eat? Fruits Berries. Apples. Oranges. Peaches. Apricots. Plums. Grapes. Mango. Papaya. Pomegranate. Kiwi. Cherries. Vegetables Lettuce. Spinach. Leafy greens, including kale, chard, collard greens, and mustard greens. Beets. Cauliflower. Cabbage. Broccoli. Carrots. Green beans. Tomatoes. Peppers. Onions. Cucumbers. Brussels sprouts. Grains Whole grains, such as whole-wheat or whole-grain bread, crackers, tortillas, cereal, and pasta. Unsweetened oatmeal. Quinoa. Brown or wild rice. Meats and other proteins Seafood. Poultry without skin. Lean cuts of poultry and beef.  Tofu. Nuts. Seeds. Dairy Low-fat or fat-free dairy products such as milk, yogurt, and cheese. The items listed above may not be a complete list of foods and beverages you can eat. Contact a dietitian for more information. What foods should I avoid? Fruits Fruits canned with syrup. Vegetables Canned vegetables. Frozen vegetables with butter or cream sauce. Grains Refined white flour and flour products such as bread, pasta, snack foods, and cereals. Avoid all processed foods. Meats and other proteins Fatty cuts of meat. Poultry with skin. Breaded or fried meats. Processed meat. Avoid saturated fats. Dairy Full-fat yogurt, cheese, or milk. Beverages Sweetened drinks, such as soda or iced tea. The items listed above may not be a complete list of foods and beverages you should avoid. Contact a dietitian for more information. Questions to ask a health care provider  Do I need to meet with a diabetes educator?  Do I need to meet with a dietitian?  What number can I call if I have questions?  When are the best times to check my blood glucose? Where to find more information:  American Diabetes Association: diabetes.org  Academy of Nutrition and Dietetics: www.eatright.[employer] of Diabetes and Digestive and Kidney Diseases: [employer]  Association of Diabetes Care and Education Specialists: www.diabeteseducator.org Summary  It is important to have healthy eating habits because your blood sugar (glucose) levels are greatly affected by what you eat and drink.  A healthy meal plan will help you control your blood glucose and maintain a healthy lifestyle.  Your health care provider may recommend that you work with a dietitian to make a meal plan that is best for you.  Keep in mind that carbohydrates (carbs) and alcohol have immediate effects on your blood glucose levels. It is important to count carbs and to use alcohol carefully. This information is not intended  to replace advice given to you by your health care provider. Make sure you discuss any questions you have with your health care provider. Document Revised: 10/10/2019 Document Reviewed: 10/10/2019 Elsevier Patient Education  2021 [employer].

## 2021-03-14 ENCOUNTER — Ambulatory Visit (INDEPENDENT_AMBULATORY_CARE_PROVIDER_SITE_OTHER): Payer: Medicare HMO

## 2021-03-14 DIAGNOSIS — I509 Heart failure, unspecified: Secondary | ICD-10-CM

## 2021-03-14 DIAGNOSIS — J449 Chronic obstructive pulmonary disease, unspecified: Secondary | ICD-10-CM

## 2021-03-14 DIAGNOSIS — E1121 Type 2 diabetes mellitus with diabetic nephropathy: Secondary | ICD-10-CM | POA: Diagnosis not present

## 2021-03-14 NOTE — Chronic Care Management (AMB) (Signed)
Chronic Care Management   Follow Up Note   03/14/2021 Name: Lawrence [name] MRN: [id number] DOB: 1956-01-15  Primary Care Provider: Birdie Sons, MD Reason for referral : Chronic Care Management   Lawrence [name] is a 65 y.o. year old male who is a primary care patient of [name], [name], MD. Lawrence [name] is currently enrolled in the Chronic Care Management program. A routine telephonic outreach was conducted today.  Review of Lawrence [name] status, including review of consultants reports, relevant labs and test results was conducted today. Collaboration with appropriate care team members was performed as part of the comprehensive evaluation and provision of chronic care management services.    SDOH (Social Determinants of Health) assessments performed: No    Outpatient Encounter Medications as of 03/14/2021  Medication Sig Note  . Accu-Chek Softclix Lancets lancets Use as instructed to check sugar daily for type 2 diabetes.   Marland Kitchen albuterol (VENTOLIN HFA) 108 (90 Base) MCG/ACT inhaler INHALE 2 PUFFS BY MOUTH EVERY 6 HOURS AS NEEDED FOR SHORTNESS OF BREATH   . allopurinol (ZYLOPRIM) 300 MG tablet TAKE 1 TABLET BY MOUTH TWICE A DAY   . ALPRAZolam (XANAX) 1 MG tablet Take 1 tablet (1 mg total) by mouth 3 (three) times daily.   Marland Kitchen amLODipine (NORVASC) 2.5 MG tablet Take 1 tablet (2.5 mg total) by mouth daily.   Marland Kitchen aspirin 81 MG chewable tablet Chew 81 mg by mouth daily. Swallows whole   . atorvastatin (LIPITOR) 80 MG tablet TAKE ONE TABLET BY MOUTH AT BEDTIME   . Blood Glucose Calibration (ACCU-CHEK GUIDE CONTROL) LIQD Use with blood glucose monitor as directed   . Blood Glucose Monitoring Suppl (ACCU-CHEK GUIDE) w/Device KIT Use to check blood sugars as directed   . BREO ELLIPTA 100-25 MCG/INH AEPB INHALE 1 PUFF INTO THE LUNGS DAILY   . budesonide (PULMICORT) 0.5 MG/2ML nebulizer solution Take 0.5 mg by nebulization 2 (two) times daily as needed (shortness of breath or wheezing). 12/11/2020:  Out of medication, only takes as needed  . cetirizine (ZYRTEC) 10 MG tablet TAKE ONE TABLET BY MOUTH AT BEDTIME   . docusate sodium (COLACE) 100 MG capsule Take 100 mg by mouth daily as needed for mild constipation.   Marland Kitchen ELIQUIS 5 MG TABS tablet TAKE 1 TABLET BY MOUTH TWICE A DAY   . FARXIGA 10 MG TABS tablet TAKE 1 TABLET BY MOUTH DAILY (Patient taking differently: Take 10 mg by mouth daily.)   . glucose blood (ACCU-CHEK GUIDE) test strip Use as instructed to check sugar daily for type 2 diabetes.   . isosorbide mononitrate (IMDUR) 120 MG 24 hr tablet TAKE 1 TABLET BY MOUTH DAILY   . LINZESS 72 MCG capsule Take 72 mcg by mouth daily before breakfast.    . lisinopril (ZESTRIL) 10 MG tablet TAKE 1 TABLET BY MOUTH DAILY   . magnesium oxide (MAG-OX) 400 (241.3 Mg) MG tablet TAKE 1 TABLET BY MOUTH EVERY MORNING   . metFORMIN (GLUCOPHAGE-XR) 500 MG 24 hr tablet Take 500 mg by mouth 2 (two) times daily.   . methocarbamol (ROBAXIN) 500 MG tablet Take 1 tablet (500 mg total) by mouth every 8 (eight) hours as needed for muscle spasms.   . naloxone (NARCAN) nasal spray 4 mg/0.1 mL 1 spray into nostril x1 and may repeat every 2-3 minutes until patient is responsive or EMS arrives   . nitroGLYCERIN (NITROSTAT) 0.4 MG SL tablet PLACE ONE TABLET UNDER THE TONGUE EVERY 5 MINUTES FOR CHEST PAIN.  IF NO RELIEF PAST 3RD TAB GO TO EMERGENCY ROOM   . omega-3 acid ethyl esters (LOVAZA) 1 g capsule TAKE 4 CAPSULES BY MOUTH DAILY   . omeprazole (PRILOSEC) 40 MG capsule Take 40 mg by mouth daily.    Marland Kitchen oxyCODONE-acetaminophen (PERCOCET) 10-325 MG tablet Take 1 tablet by mouth every 4 (four) hours as needed. for pain   . promethazine (PHENERGAN) 25 MG tablet Take 1 tablet (25 mg total) by mouth every 8 (eight) hours as needed for nausea or vomiting.   . ranolazine (RANEXA) 1000 MG SR tablet TAKE 1 TABLET BY MOUTH TWICE A DAY   . sotalol (BETAPACE) 80 MG tablet Take 40 mg by mouth 2 (two) times daily.   Marland Kitchen SPIRIVA HANDIHALER  18 MCG inhalation capsule PLACE 1 CAPSULE INTO INHALER AND INHALE BY MOUTH ONCE A DAY.   Marland Kitchen torsemide (DEMADEX) 100 MG tablet Take 0.5 tablets (50 mg total) by mouth daily. 01/15/2021: Reports taking as needed.  . traZODone (DESYREL) 150 MG tablet TAKE 1 TABLET BY MOUTH AT BEDTIME   . venlafaxine XR (EFFEXOR-XR) 75 MG 24 hr capsule TAKE 1 CAPSULE BY MOUTH DAILY WITH BREAKFAST    No facility-administered encounter medications on file as of 03/14/2021.     Objective:   Patient Care Plan: COPD (Adult)    Problem Identified: Symptom Exacerbation (COPD)     Long-Range Goal: Symptom Exacerbation Prevented or Minimized   Start Date: 01/15/2021  Expected End Date: 05/15/2021  Priority: High  Note:   Current Barriers:  . Chronic Disease Management support and educational needs r/t COPD  Case Manager Clinical Goal(s): Marland Kitchen Over the next 120 days, patient will not require hospitalization for complications r/t COPD exacerbation  Interventions:  . Collaboration with Birdie Sons, MD regarding development and update of comprehensive plan of care as evidenced by provider attestation and co-signature . Inter-disciplinary care team collaboration (see longitudinal plan of care) . Reviewed medications and current treatment plan. Reports doing very well with COPD self-management. Symptoms are well controlled with prescribed inhalers. Denies cough. Experiences exertional dyspnea but denies symptoms at rest. No change or decline in activity tolerance. . Encouraged to continue daily self assessment. Reviewed indications for notifying his provider for unrelieved symptoms. Reviewed worsening s/sx that require immediate medical attention.    Self-Care Activities/Patient Goals: -Take medications and utilize inhalers as prescribed -Assess symptoms daily and notify provider if in the yellow zone for 48 hrs without improvement -Avoid extreme temperatures and strenuous activites -Follow recommendations to prevent  respiratory infection -Notify provider or care management team with questions and new concerns as needed   Follow Up Plan:  Will follow up next month   Patient Care Plan: Diabetes Type 2 (Adult)    Problem Identified: Glycemic Management (Diabetes, Type 2)     Long-Range Goal: Glycemic Management Optimized   Start Date: 01/15/2021  Expected End Date: 05/15/2021  Priority: High  Note:   Objective:  Lab Results  Component Value Date   HGBA1C 7.5 (H) 12/12/2020 .   Lab Results  Component Value Date   CREATININE 0.81 12/10/2020   CREATININE 0.91 11/23/2020   CREATININE 0.88 10/08/2020 .   Marland Kitchen No results found for: EGFR  Current Barriers:  . Chronic disease management and support related to Diabetes self-management   Case Manager Clinical Goal(s):  Marland Kitchen Over the next 120 days, patient will demonstrate improved adherence to prescribed treatment plan for Diabetes self-management as evidenced by daily monitoring and recording of CBG, adherence to  ADA/ carb modified diet and adherence to prescribed medication regimen.   Interventions:  . Collaboration with Birdie Sons, MD regarding development and update of comprehensive plan of care as evidenced by provider attestation and co-signature . Inter-disciplinary care team collaboration (see longitudinal plan of care) . Reviewed medications and current treatment plan. Reports excellent compliance with medications. Reports not monitoring blood glucose levels recently. Unable to recall last reading. Reports improvements with nutritional intake and maintaining a heart healthy/diabetic diet. Denies s/sx of hypoglycemia or hyperglycemia. Encouraged to monitor levels and maintain a log.   Patient Goals/Self-Care Activities - Self-administer medications as prescribed - Attend all scheduled provider appointments - Monitor blood glucose levels consistently and utilize recommended interventions - Adhere to prescribed ADA/carb modified - Notify  provider or care management team with questions and new concerns as needed   Follow Up Plan:  Will follow up next month     Patient Care Plan: Heart Failure (Adult)    Problem Identified: Symptom Exacerbation (Heart Failure)     Long-Range Goal: Symptom Exacerbation Prevented or Minimized   Start Date: 01/15/2021  Expected End Date: 05/15/2021  Priority: High  Note:   Wt Readings from Last 3 Encounters:  01/03/21 223 lb 8 oz (101.4 kg)  12/16/20 217 lb (98.4 kg)  12/12/20 219 lb 2.2 oz (99.4 kg)    Current Barriers:  . Chronic Disease Management support and educational needs r/t CHF.  Case Manager Clinical Goal(s):  Marland Kitchen Over the next 120 days, patient will not require hospitalization or emergent care d/t complications r/t CHF exacerbation.  Interventions:  . Collaboration with Birdie Sons, MD regarding development and update of comprehensive plan of care as evidenced by provider attestation and co-signature . Inter-disciplinary care team collaboration (see longitudinal plan of care) . Reviewed current plan for CHF management. Reports weights have remained stable. Denies increased abdominal or lower extremity edema. Reports not requiring torsemide for several weeks. Denies chest pain or palpitations. Experiences exertional dyspnea. Denies shortness of breath at rest. No changes in activity tolerance. Reports doing very well with nutritional intake. Encouraged to continue monitoring weights and recording readings.  . Reviewed indications for notifying a provider if weight increases more than 3 lbs overnight or 5 lbs within a week. . Reviewed worsening s/sx related to CHF exacerbation and indications for seeking immediate medical attention.   Patient Goals/Self-Care Activities:  -Take all medications as prescribed -Monitor weight and record readings -Adhere to recommended cardiac prudent/heart healthy diet -Notify provider for weight gain outside of established  parameters -Contact provider or care management team with questions and new concerns as needed.   Follow Up Plan:  Will follow up next month             PLAN A member of the care management team will follow up with Lawrence [name] next month.    Cristy Friedlander [facility] Care Management [facility] [phone]

## 2021-03-19 ENCOUNTER — Other Ambulatory Visit: Payer: Self-pay | Admitting: Family Medicine

## 2021-03-19 NOTE — Telephone Encounter (Signed)
Requested Prescriptions  Pending Prescriptions Disp Refills  . SPIRIVA HANDIHALER 18 MCG inhalation capsule [Pharmacy Med Name: SPIRIVA HANDIHALER 18 MCG INH CAP] 30 capsule 0    Sig: PLACE 1 CAPSULE INTO INHALER AND INHALE BY MOUTH ONCE A DAY.     Pulmonology:  Anticholinergic Agents Passed - 03/19/2021  1:08 PM      Passed - Valid encounter within last 12 months    Recent Outpatient Visits          1 week ago Type 2 diabetes mellitus with diabetic nephropathy, without long-term current use of insulin ([place])   [facility] Birdie Sons, MD   3 months ago Type 2 diabetes mellitus with diabetic nephropathy, without long-term current use of insulin ([place])   [facility] Birdie Sons, MD   5 months ago Chest wall pain   [facility] Birdie Sons, MD   5 months ago Type 2 diabetes mellitus with diabetic nephropathy, without long-term current use of insulin [facility])   [facility] Birdie Sons, MD   10 months ago Type 2 diabetes mellitus with diabetic nephropathy, without long-term current use of insulin [facility])   [facility] Birdie Sons, MD      Future Appointments            In 4 months [name], [name], MD [facility], [place]   In 4 months Ralene Bathe, MD [place]           . BREO ELLIPTA 100-25 MCG/INH AEPB [Pharmacy Med Name: BREO ELLIPTA 100-25 MCG/INH INH AEP] 60 each 2    Sig: INHALE 1 PUFF INTO THE LUNGS DAILY     Pulmonology:  Combination Products Passed - 03/19/2021  1:08 PM      Passed - Valid encounter within last 12 months    Recent Outpatient Visits          1 week ago Type 2 diabetes mellitus with diabetic nephropathy, without long-term current use of insulin ([place])   [facility] Birdie Sons, MD   3 months ago Type 2 diabetes mellitus with diabetic nephropathy, without long-term current use of insulin ([place])   [facility]  Birdie Sons, MD   5 months ago Chest wall pain   [facility] Birdie Sons, MD   5 months ago Type 2 diabetes mellitus with diabetic nephropathy, without long-term current use of insulin [facility])   [facility] Birdie Sons, MD   10 months ago Type 2 diabetes mellitus with diabetic nephropathy, without long-term current use of insulin [facility])   [facility] Birdie Sons, MD      Future Appointments            In 4 months [name], [name], MD [facility], [place]   In 4 months Ralene Bathe, MD [place]

## 2021-03-20 NOTE — Patient Instructions (Addendum)
Thank you for allowing the Chronic Care Management team to participate in your care. It was a pleasure speaking with you today. Please feel free to contact me with questions.   Goals Addressed: Patient Care Plan: COPD (Adult)    Problem Identified: Symptom Exacerbation (COPD)     Long-Range Goal: Symptom Exacerbation Prevented or Minimized   Start Date: 01/15/2021  Expected End Date: 05/15/2021  Priority: High  Note:   Current Barriers:  . Chronic Disease Management support and educational needs r/t COPD  Case Manager Clinical Goal(s): Marland Kitchen Over the next 120 days, patient will not require hospitalization for complications r/t COPD exacerbation  Interventions:  . Collaboration with Birdie Sons, MD regarding development and update of comprehensive plan of care as evidenced by provider attestation and co-signature . Inter-disciplinary care team collaboration (see longitudinal plan of care) . Reviewed medications and current treatment plan. Reports doing very well with COPD self-management. Symptoms are well controlled with prescribed inhalers. Denies cough. Experiences exertional dyspnea but denies symptoms at rest. No change or decline in activity tolerance. . Encouraged to continue daily self assessment. Reviewed indications for notifying his provider for unrelieved symptoms. Reviewed worsening s/sx that require immediate medical attention.    Self-Care Activities/Patient Goals: -Take medications and utilize inhalers as prescribed -Assess symptoms daily and notify provider if in the yellow zone for 48 hrs without improvement -Avoid extreme temperatures and strenuous activites -Follow recommendations to prevent respiratory infection -Notify provider or care management team with questions and new concerns as needed   Follow Up Plan:  Will follow up next month   Patient Care Plan: Diabetes Type 2 (Adult)    Problem Identified: Glycemic Management (Diabetes, Type 2)      Long-Range Goal: Glycemic Management Optimized   Start Date: 01/15/2021  Expected End Date: 05/15/2021  Priority: High  Note:   Objective:  Lab Results  Component Value Date   HGBA1C 7.5 (H) 12/12/2020 .   Lab Results  Component Value Date   CREATININE 0.81 12/10/2020   CREATININE 0.91 11/23/2020   CREATININE 0.88 10/08/2020 .   Marland Kitchen No results found for: EGFR  Current Barriers:  . Chronic disease management and support related to Diabetes self-management   Case Manager Clinical Goal(s):  Marland Kitchen Over the next 120 days, patient will demonstrate improved adherence to prescribed treatment plan for Diabetes self-management as evidenced by daily monitoring and recording of CBG, adherence to ADA/ carb modified diet and adherence to prescribed medication regimen.   Interventions:  . Collaboration with Birdie Sons, MD regarding development and update of comprehensive plan of care as evidenced by provider attestation and co-signature . Inter-disciplinary care team collaboration (see longitudinal plan of care) . Reviewed medications and current treatment plan. Reports excellent compliance with medications. Reports not monitoring blood glucose levels recently. Unable to recall last reading. Reports improvements with nutritional intake and maintaining a heart healthy/diabetic diet. Denies s/sx of hypoglycemia or hyperglycemia. Encouraged to monitor levels and maintain a log.   Patient Goals/Self-Care Activities - Self-administer medications as prescribed - Attend all scheduled provider appointments - Monitor blood glucose levels consistently and utilize recommended interventions - Adhere to prescribed ADA/carb modified - Notify provider or care management team with questions and new concerns as needed   Follow Up Plan:  Will follow up next month     Patient Care Plan: Heart Failure (Adult)    Problem Identified: Symptom Exacerbation (Heart Failure)     Long-Range Goal: Symptom  Exacerbation Prevented or Minimized  Start Date: 01/15/2021  Expected End Date: 05/15/2021  Priority: High  Note:   Wt Readings from Last 3 Encounters:  01/03/21 223 lb 8 oz (101.4 kg)  12/16/20 217 lb (98.4 kg)  12/12/20 219 lb 2.2 oz (99.4 kg)    Current Barriers:  . Chronic Disease Management support and educational needs r/t CHF.  Case Manager Clinical Goal(s):  Marland Kitchen Over the next 120 days, patient will not require hospitalization or emergent care d/t complications r/t CHF exacerbation.  Interventions:  . Collaboration with Birdie Sons, MD regarding development and update of comprehensive plan of care as evidenced by provider attestation and co-signature . Inter-disciplinary care team collaboration (see longitudinal plan of care) . Reviewed current plan for CHF management. Reports weights have remained stable. Denies increased abdominal or lower extremity edema. Reports not requiring torsemide for several weeks. Denies chest pain or palpitations. Experiences exertional dyspnea. Denies shortness of breath at rest. No changes in activity tolerance. Reports doing very well with nutritional intake. Encouraged to continue monitoring weights and recording readings.  . Reviewed indications for notifying a provider if weight increases more than 3 lbs overnight or 5 lbs within a week. . Reviewed worsening s/sx related to CHF exacerbation and indications for seeking immediate medical attention.   Patient Goals/Self-Care Activities:  -Take all medications as prescribed -Monitor weight and record readings -Adhere to recommended cardiac prudent/heart healthy diet -Notify provider for weight gain outside of established parameters -Contact provider or care management team with questions and new concerns as needed.   Follow Up Plan:  Will follow up next month              Lawrence [name] verbalized understanding of the information discussed during the telephonic outreach today. Declined  need for mailed/printed instructions. A member of the care management team will follow up next month.    Cristy Friedlander [facility] Care Management [facility] [phone]

## 2021-04-02 ENCOUNTER — Inpatient Hospital Stay: Payer: Medicare HMO | Attending: Oncology

## 2021-04-02 ENCOUNTER — Other Ambulatory Visit: Payer: Self-pay | Admitting: Family Medicine

## 2021-04-02 NOTE — Telephone Encounter (Signed)
Requested medication (s) are due for refill today: yes   Requested medication (s) are on the active medication list: yes  Last refill:  03/01/2021  Future visit scheduled: yes  Notes to clinic:  this refill cannot be delegated    Requested Prescriptions  Pending Prescriptions Disp Refills   ranolazine (RANEXA) 1000 MG SR tablet [Pharmacy Med Name: RANOLAZINE ER 1000 MG TAB] 60 tablet 5    Sig: TAKE 1 TABLET BY MOUTH TWICE A DAY      Not Delegated - Cardiovascular: Ranolazine Failed - 04/02/2021  9:52 AM      Failed - This refill cannot be delegated      Passed - K in normal range and within 360 days    Potassium  Date Value Ref Range Status  12/12/2020 4.1 3.5 - 5.1 mmol/L Final    Comment:    Performed at [facility], [address] [place], [place] [address]  03/07/2015 3.9 mmol/L Final    Comment:    3.5-5.1 NOTE: New Reference Range  01/22/15           Passed - Valid encounter within last 12 months    Recent Outpatient Visits           3 weeks ago Type 2 diabetes mellitus with diabetic nephropathy, without long-term current use of insulin ([place])   [facility] Birdie Sons, MD   3 months ago Type 2 diabetes mellitus with diabetic nephropathy, without long-term current use of insulin ([place])   [facility] Birdie Sons, MD   5 months ago Chest wall pain   [facility] Birdie Sons, MD   6 months ago Type 2 diabetes mellitus with diabetic nephropathy, without long-term current use of insulin [facility])   [facility] Birdie Sons, MD   10 months ago Type 2 diabetes mellitus with diabetic nephropathy, without long-term current use of insulin [facility])   [facility] Birdie Sons, MD       Future Appointments             In 4 months [name], [name], MD [facility], [place]   In 4 months Ralene Bathe, MD [place]

## 2021-04-04 ENCOUNTER — Inpatient Hospital Stay: Payer: Medicare HMO | Admitting: Oncology

## 2021-04-04 ENCOUNTER — Telehealth: Payer: Self-pay | Admitting: Oncology

## 2021-04-04 ENCOUNTER — Inpatient Hospital Stay: Payer: Medicare HMO

## 2021-04-04 NOTE — Telephone Encounter (Signed)
Called pt to inform him that he did not need to come to his scheduled appt today 5/20. Pt did not have his labs drawn on 5/18. Will need labs prior to Md\phlebotomy appt.

## 2021-04-04 NOTE — Progress Notes (Signed)
This encounter was created in error - please disregard.

## 2021-04-08 ENCOUNTER — Other Ambulatory Visit: Payer: Self-pay | Admitting: Family Medicine

## 2021-04-08 DIAGNOSIS — M5410 Radiculopathy, site unspecified: Secondary | ICD-10-CM

## 2021-04-08 NOTE — Telephone Encounter (Signed)
Requested medication (s) are due for refill today: Yes  Requested medication (s) are on the active medication list: Yes  Last refill:  03/10/21  Future visit scheduled: Yes  Notes to clinic:  See request.    Requested Prescriptions  Pending Prescriptions Disp Refills   oxyCODONE-acetaminophen (PERCOCET) 10-325 MG tablet [Pharmacy Med Name: OXYCODONE-APAP 10-325 MG TAB] 180 tablet     Sig: TAKE 1 TABLET BY MOUTH EVERY 4 HOURS AS NEEDED FOR PAIN      Not Delegated - Analgesics:  Opioid Agonist Combinations Failed - 04/08/2021  9:43 AM      Failed - This refill cannot be delegated      Passed - Urine Drug Screen completed in last 360 days      Passed - Valid encounter within last 6 months    Recent Outpatient Visits           4 weeks ago Type 2 diabetes mellitus with diabetic nephropathy, without long-term current use of insulin ([place])   [facility] Birdie Sons, MD   3 months ago Type 2 diabetes mellitus with diabetic nephropathy, without long-term current use of insulin ([place])   [facility] Birdie Sons, MD   5 months ago Chest wall pain   [facility] Birdie Sons, MD   6 months ago Type 2 diabetes mellitus with diabetic nephropathy, without long-term current use of insulin [facility])   [facility] Birdie Sons, MD   11 months ago Type 2 diabetes mellitus with diabetic nephropathy, without long-term current use of insulin [facility])   [facility] Birdie Sons, MD       Future Appointments             In 4 months [name], [name], MD [facility], [place]   In 4 months Ralene Bathe, MD [place]

## 2021-04-16 ENCOUNTER — Inpatient Hospital Stay: Payer: Medicare HMO | Attending: Oncology

## 2021-04-16 DIAGNOSIS — Z862 Personal history of diseases of the blood and blood-forming organs and certain disorders involving the immune mechanism: Secondary | ICD-10-CM | POA: Insufficient documentation

## 2021-04-16 DIAGNOSIS — D751 Secondary polycythemia: Secondary | ICD-10-CM | POA: Insufficient documentation

## 2021-04-16 LAB — CBC WITH DIFFERENTIAL/PLATELET
Abs Immature Granulocytes: 0.03 10*3/uL (ref 0.00–0.07)
Basophils Absolute: 0 10*3/uL (ref 0.0–0.1)
Basophils Relative: 0 %
Eosinophils Absolute: 0.1 10*3/uL (ref 0.0–0.5)
Eosinophils Relative: 2 %
HCT: 51.9 % (ref 39.0–52.0)
Hemoglobin: 17.7 g/dL — ABNORMAL HIGH (ref 13.0–17.0)
Immature Granulocytes: 1 %
Lymphocytes Relative: 28 %
Lymphs Abs: 1.7 10*3/uL (ref 0.7–4.0)
MCH: 32.2 pg (ref 26.0–34.0)
MCHC: 34.1 g/dL (ref 30.0–36.0)
MCV: 94.4 fL (ref 80.0–100.0)
Monocytes Absolute: 0.4 10*3/uL (ref 0.1–1.0)
Monocytes Relative: 7 %
Neutro Abs: 3.7 10*3/uL (ref 1.7–7.7)
Neutrophils Relative %: 62 %
Platelets: 146 10*3/uL — ABNORMAL LOW (ref 150–400)
RBC: 5.5 MIL/uL (ref 4.22–5.81)
RDW: 13.5 % (ref 11.5–15.5)
WBC: 6 10*3/uL (ref 4.0–10.5)
nRBC: 0 % (ref 0.0–0.2)

## 2021-04-16 LAB — FERRITIN: Ferritin: 30 ng/mL (ref 24–336)

## 2021-04-18 ENCOUNTER — Telehealth: Payer: Self-pay | Admitting: Oncology

## 2021-04-18 ENCOUNTER — Inpatient Hospital Stay: Payer: Medicare HMO

## 2021-04-18 ENCOUNTER — Inpatient Hospital Stay: Payer: Medicare HMO | Admitting: Oncology

## 2021-04-18 NOTE — Telephone Encounter (Signed)
Patient called to report that he "had something come up" and is unable to come into his appointment to see NP and receive Phlebotomy. He would like to push back appt by a few weeks. Rescheduled for 6/16 (Labs completed on 6/1).   Routing to clinical team to make aware.

## 2021-04-21 ENCOUNTER — Other Ambulatory Visit: Payer: Self-pay | Admitting: Family Medicine

## 2021-04-21 DIAGNOSIS — G4700 Insomnia, unspecified: Secondary | ICD-10-CM

## 2021-04-22 DIAGNOSIS — I480 Paroxysmal atrial fibrillation: Secondary | ICD-10-CM | POA: Diagnosis not present

## 2021-04-22 DIAGNOSIS — R778 Other specified abnormalities of plasma proteins: Secondary | ICD-10-CM | POA: Diagnosis not present

## 2021-04-22 DIAGNOSIS — I10 Essential (primary) hypertension: Secondary | ICD-10-CM | POA: Diagnosis not present

## 2021-04-22 DIAGNOSIS — I509 Heart failure, unspecified: Secondary | ICD-10-CM | POA: Diagnosis not present

## 2021-04-22 DIAGNOSIS — I25118 Atherosclerotic heart disease of native coronary artery with other forms of angina pectoris: Secondary | ICD-10-CM | POA: Diagnosis not present

## 2021-04-22 DIAGNOSIS — I214 Non-ST elevation (NSTEMI) myocardial infarction: Secondary | ICD-10-CM | POA: Diagnosis not present

## 2021-04-26 ENCOUNTER — Inpatient Hospital Stay
Admission: EM | Admit: 2021-04-26 | Discharge: 2021-05-02 | DRG: 287 | Disposition: A | Discharge status: Home / Self Care | Payer: Medicare HMO | Attending: Internal Medicine | Admitting: Internal Medicine

## 2021-04-26 ENCOUNTER — Encounter: Payer: Self-pay | Admitting: Oncology

## 2021-04-26 ENCOUNTER — Other Ambulatory Visit: Payer: Self-pay

## 2021-04-26 ENCOUNTER — Emergency Department: Payer: Medicare HMO

## 2021-04-26 DIAGNOSIS — I110 Hypertensive heart disease with heart failure: Secondary | ICD-10-CM | POA: Diagnosis present

## 2021-04-26 DIAGNOSIS — M5410 Radiculopathy, site unspecified: Secondary | ICD-10-CM

## 2021-04-26 DIAGNOSIS — J439 Emphysema, unspecified: Secondary | ICD-10-CM | POA: Diagnosis present

## 2021-04-26 DIAGNOSIS — Z6834 Body mass index (BMI) 34.0-34.9, adult: Secondary | ICD-10-CM | POA: Diagnosis not present

## 2021-04-26 DIAGNOSIS — E785 Hyperlipidemia, unspecified: Secondary | ICD-10-CM | POA: Diagnosis present

## 2021-04-26 DIAGNOSIS — I5023 Acute on chronic systolic (congestive) heart failure: Secondary | ICD-10-CM | POA: Diagnosis present

## 2021-04-26 DIAGNOSIS — G4733 Obstructive sleep apnea (adult) (pediatric): Secondary | ICD-10-CM | POA: Diagnosis present

## 2021-04-26 DIAGNOSIS — R0689 Other abnormalities of breathing: Secondary | ICD-10-CM | POA: Diagnosis not present

## 2021-04-26 DIAGNOSIS — Z825 Family history of asthma and other chronic lower respiratory diseases: Secondary | ICD-10-CM

## 2021-04-26 DIAGNOSIS — I4891 Unspecified atrial fibrillation: Secondary | ICD-10-CM | POA: Diagnosis not present

## 2021-04-26 DIAGNOSIS — J449 Chronic obstructive pulmonary disease, unspecified: Secondary | ICD-10-CM | POA: Diagnosis not present

## 2021-04-26 DIAGNOSIS — Z7984 Long term (current) use of oral hypoglycemic drugs: Secondary | ICD-10-CM

## 2021-04-26 DIAGNOSIS — I5022 Chronic systolic (congestive) heart failure: Secondary | ICD-10-CM | POA: Diagnosis not present

## 2021-04-26 DIAGNOSIS — J9610 Chronic respiratory failure, unspecified whether with hypoxia or hypercapnia: Secondary | ICD-10-CM | POA: Diagnosis present

## 2021-04-26 DIAGNOSIS — I5032 Chronic diastolic (congestive) heart failure: Secondary | ICD-10-CM | POA: Diagnosis present

## 2021-04-26 DIAGNOSIS — R0789 Other chest pain: Secondary | ICD-10-CM | POA: Diagnosis not present

## 2021-04-26 DIAGNOSIS — R079 Chest pain, unspecified: Secondary | ICD-10-CM | POA: Diagnosis present

## 2021-04-26 DIAGNOSIS — F419 Anxiety disorder, unspecified: Secondary | ICD-10-CM | POA: Diagnosis present

## 2021-04-26 DIAGNOSIS — I10 Essential (primary) hypertension: Secondary | ICD-10-CM

## 2021-04-26 DIAGNOSIS — I252 Old myocardial infarction: Secondary | ICD-10-CM | POA: Diagnosis not present

## 2021-04-26 DIAGNOSIS — Z885 Allergy status to narcotic agent status: Secondary | ICD-10-CM

## 2021-04-26 DIAGNOSIS — G4730 Sleep apnea, unspecified: Secondary | ICD-10-CM | POA: Diagnosis present

## 2021-04-26 DIAGNOSIS — I25118 Atherosclerotic heart disease of native coronary artery with other forms of angina pectoris: Secondary | ICD-10-CM | POA: Diagnosis present

## 2021-04-26 DIAGNOSIS — D751 Secondary polycythemia: Secondary | ICD-10-CM | POA: Diagnosis present

## 2021-04-26 DIAGNOSIS — F39 Unspecified mood [affective] disorder: Secondary | ICD-10-CM | POA: Diagnosis present

## 2021-04-26 DIAGNOSIS — Z818 Family history of other mental and behavioral disorders: Secondary | ICD-10-CM

## 2021-04-26 DIAGNOSIS — E782 Mixed hyperlipidemia: Secondary | ICD-10-CM | POA: Diagnosis present

## 2021-04-26 DIAGNOSIS — Z9981 Dependence on supplemental oxygen: Secondary | ICD-10-CM | POA: Diagnosis not present

## 2021-04-26 DIAGNOSIS — Z8249 Family history of ischemic heart disease and other diseases of the circulatory system: Secondary | ICD-10-CM

## 2021-04-26 DIAGNOSIS — E876 Hypokalemia: Secondary | ICD-10-CM | POA: Diagnosis present

## 2021-04-26 DIAGNOSIS — E1165 Type 2 diabetes mellitus with hyperglycemia: Secondary | ICD-10-CM | POA: Diagnosis present

## 2021-04-26 DIAGNOSIS — R202 Paresthesia of skin: Secondary | ICD-10-CM | POA: Diagnosis not present

## 2021-04-26 DIAGNOSIS — Z7982 Long term (current) use of aspirin: Secondary | ICD-10-CM

## 2021-04-26 DIAGNOSIS — G4700 Insomnia, unspecified: Secondary | ICD-10-CM | POA: Diagnosis present

## 2021-04-26 DIAGNOSIS — Z85828 Personal history of other malignant neoplasm of skin: Secondary | ICD-10-CM

## 2021-04-26 DIAGNOSIS — Z87891 Personal history of nicotine dependence: Secondary | ICD-10-CM

## 2021-04-26 DIAGNOSIS — Z882 Allergy status to sulfonamides status: Secondary | ICD-10-CM

## 2021-04-26 DIAGNOSIS — Z79899 Other long term (current) drug therapy: Secondary | ICD-10-CM

## 2021-04-26 DIAGNOSIS — I200 Unstable angina: Secondary | ICD-10-CM | POA: Diagnosis not present

## 2021-04-26 DIAGNOSIS — Z888 Allergy status to other drugs, medicaments and biological substances status: Secondary | ICD-10-CM

## 2021-04-26 DIAGNOSIS — I160 Hypertensive urgency: Secondary | ICD-10-CM | POA: Diagnosis present

## 2021-04-26 DIAGNOSIS — I480 Paroxysmal atrial fibrillation: Secondary | ICD-10-CM | POA: Diagnosis present

## 2021-04-26 DIAGNOSIS — Z7951 Long term (current) use of inhaled steroids: Secondary | ICD-10-CM

## 2021-04-26 DIAGNOSIS — Z7901 Long term (current) use of anticoagulants: Secondary | ICD-10-CM

## 2021-04-26 DIAGNOSIS — K219 Gastro-esophageal reflux disease without esophagitis: Secondary | ICD-10-CM | POA: Diagnosis present

## 2021-04-26 DIAGNOSIS — M109 Gout, unspecified: Secondary | ICD-10-CM | POA: Diagnosis present

## 2021-04-26 DIAGNOSIS — I2510 Atherosclerotic heart disease of native coronary artery without angina pectoris: Secondary | ICD-10-CM | POA: Diagnosis present

## 2021-04-26 DIAGNOSIS — Z955 Presence of coronary angioplasty implant and graft: Secondary | ICD-10-CM

## 2021-04-26 DIAGNOSIS — Z20822 Contact with and (suspected) exposure to covid-19: Secondary | ICD-10-CM | POA: Diagnosis present

## 2021-04-26 DIAGNOSIS — I25110 Atherosclerotic heart disease of native coronary artery with unstable angina pectoris: Secondary | ICD-10-CM | POA: Diagnosis not present

## 2021-04-26 DIAGNOSIS — Z820 Family history of epilepsy and other diseases of the nervous system: Secondary | ICD-10-CM

## 2021-04-26 DIAGNOSIS — E1121 Type 2 diabetes mellitus with diabetic nephropathy: Secondary | ICD-10-CM | POA: Diagnosis present

## 2021-04-26 DIAGNOSIS — Z833 Family history of diabetes mellitus: Secondary | ICD-10-CM

## 2021-04-26 DIAGNOSIS — I5042 Chronic combined systolic (congestive) and diastolic (congestive) heart failure: Secondary | ICD-10-CM

## 2021-04-26 LAB — CBC WITH DIFFERENTIAL/PLATELET
Abs Immature Granulocytes: 0.03 10*3/uL (ref 0.00–0.07)
Basophils Absolute: 0.1 10*3/uL (ref 0.0–0.1)
Basophils Relative: 1 %
Eosinophils Absolute: 0.2 10*3/uL (ref 0.0–0.5)
Eosinophils Relative: 3 %
HCT: 51.2 % (ref 39.0–52.0)
Hemoglobin: 17 g/dL (ref 13.0–17.0)
Immature Granulocytes: 0 %
Lymphocytes Relative: 34 %
Lymphs Abs: 2.6 10*3/uL (ref 0.7–4.0)
MCH: 31.4 pg (ref 26.0–34.0)
MCHC: 33.2 g/dL (ref 30.0–36.0)
MCV: 94.5 fL (ref 80.0–100.0)
Monocytes Absolute: 0.6 10*3/uL (ref 0.1–1.0)
Monocytes Relative: 7 %
Neutro Abs: 4.1 10*3/uL (ref 1.7–7.7)
Neutrophils Relative %: 55 %
Platelets: 180 10*3/uL (ref 150–400)
RBC: 5.42 MIL/uL (ref 4.22–5.81)
RDW: 13.6 % (ref 11.5–15.5)
WBC: 7.6 10*3/uL (ref 4.0–10.5)
nRBC: 0 % (ref 0.0–0.2)

## 2021-04-26 LAB — COMPREHENSIVE METABOLIC PANEL
ALT: 14 U/L (ref 0–44)
AST: 14 U/L — ABNORMAL LOW (ref 15–41)
Albumin: 4 g/dL (ref 3.5–5.0)
Alkaline Phosphatase: 70 U/L (ref 38–126)
Anion gap: 6 (ref 5–15)
BUN: 9 mg/dL (ref 8–23)
CO2: 32 mmol/L (ref 22–32)
Calcium: 9.2 mg/dL (ref 8.9–10.3)
Chloride: 102 mmol/L (ref 98–111)
Creatinine, Ser: 0.69 mg/dL (ref 0.61–1.24)
GFR, Estimated: >60 mL/min (ref >60)
Glucose, Bld: 132 mg/dL — ABNORMAL HIGH (ref 70–99)
Potassium: 3.6 mmol/L (ref 3.5–5.1)
Sodium: 140 mmol/L (ref 135–145)
Total Bilirubin: 1.1 mg/dL (ref 0.3–1.2)
Total Protein: 6.9 g/dL (ref 6.5–8.1)

## 2021-04-26 LAB — TROPONIN I (HIGH SENSITIVITY): Troponin I (High Sensitivity): 19 ng/L — ABNORMAL HIGH (ref <18)

## 2021-04-26 LAB — RESP PANEL BY RT-PCR (FLU A&B, COVID) ARPGX2
Influenza A by PCR: NEGATIVE
Influenza B by PCR: NEGATIVE
SARS Coronavirus 2 by RT PCR: NEGATIVE

## 2021-04-26 LAB — LIPASE, BLOOD: Lipase: 30 U/L (ref 11–51)

## 2021-04-26 MED ORDER — NITROGLYCERIN IN D5W 200-5 MCG/ML-% IV SOLN
0.0000 ug/min | INTRAVENOUS | Status: DC
  Administered 2021-04-26: 10 ug/min via INTRAVENOUS
  Filled 2021-04-26: qty 250

## 2021-04-26 MED ORDER — MORPHINE SULFATE (PF) 4 MG/ML IV SOLN
4.0000 mg | Freq: Once | INTRAVENOUS | Status: AC
  Administered 2021-04-27: 4 mg via INTRAVENOUS
  Filled 2021-04-26: qty 1

## 2021-04-26 MED ORDER — ONDANSETRON HCL 4 MG/2ML IJ SOLN
4.0000 mg | Freq: Once | INTRAMUSCULAR | Status: AC
  Administered 2021-04-27: 4 mg via INTRAVENOUS
  Filled 2021-04-26: qty 2

## 2021-04-26 MED ORDER — HEPARIN (PORCINE) 25000 UT/250ML-% IV SOLN
1200.0000 [IU]/h | INTRAVENOUS | Status: DC
  Administered 2021-04-26: 1050 [IU]/h via INTRAVENOUS
  Filled 2021-04-26: qty 250

## 2021-04-26 NOTE — Progress Notes (Signed)
ANTICOAGULATION CONSULT NOTE - Initial Consult  Pharmacy Consult for Heparin  Indication: chest pain/ACS  Allergies  Allergen Reactions   Demerol  [Meperidine Hcl]    Demerol [Meperidine] Hives   Jardiance [Empagliflozin] Other (See Comments)    Perineal pain   Prednisone Other (See Comments) and Hypertension    Pt states that this medication puts him in A-fib    Sulfa Antibiotics Hives   Albuterol Sulfate [Albuterol] Palpitations and Other (See Comments)    Pt currently uses this medication.     Morphine Sulfate Nausea And Vomiting, Rash and Other (See Comments)    Pt states that he is only allergic to the tablet form of this medication.      Patient Measurements: Height: 5' 7"  (170.2 cm) Weight: 99.8 kg (220 lb 0.3 oz) IBW/kg (Calculated) : 66.1 Heparin Dosing Weight: 87.8 kg   Vital Signs: Temp: 98.1 F (36.7 C) (06/11 2212) Temp Source: Oral (06/11 2212) BP: 189/102 (06/11 2212) Pulse Rate: 64 (06/11 2212)  Labs: No results for input(s): HGB, HCT, PLT, APTT, LABPROT, INR, HEPARINUNFRC, HEPRLOWMOCWT, CREATININE, CKTOTAL, CKMB, TROPONINIHS in the last 72 hours.  CrCl cannot be calculated (Patient's most recent lab result is older than the maximum 21 days allowed.).   Medical History: Past Medical History:  Diagnosis Date   A-fib ([place])    Anemia    Anginal pain ([place])    Anxiety    Arthritis    Asthma    CAD (coronary artery disease)    a. 2002 CABGx2 (LIMA->LAD, VG->VG->OM1);  b. 09/2012 DES->OM;  c. 03/2015 PTCA of LAD [facility]) in setting of atretic LIMA; d. 05/2015 Cath [facility]): nonobs dzs; e. 06/2015 Cath ([facility]): LM nl, LAD 45p/d ISR, 50d, D1/2 small, LCX 50p/d ISR, OM1 70ost, 30 ISR, VG->OM1 50ost, 1m LIMA->LAD 99p/d - atretic, RCA dom, nl; f.cath 10/16: 40-50%(FFR 0.90) pLAD, 75% (FFR 0.77) mLAD s/p PCI/DES, oRCA 40% (FFR0.95)   Cancer (HCC)    SKIN CANCER ON BACK   Celiac disease    Chronic diastolic CHF (congestive heart failure[place]    a. 06/2009 Echo: EF  60-65%, Gr 1 DD, triv AI, mildly dil LA, nl RV.   COPD (chronic obstructive pulmonary disease) ([place]    a. Chronic bronchitis and emphysema.   DDD (degenerative disc disease), lumbar    Diverticulosis    Dysrhythmia    Essential hypertension    GERD (gastroesophageal reflux disease)    History of hiatal hernia    History of kidney stones    H/O   History of tobacco abuse    a. Quit 2014.   Myocardial infarction ([facility] 2002   4 STENTS   Pancreatitis    PSVT ([place]oxysmal [place]ricular tachycardia) ([place]    a. 10/2012 Noted on [facility].   Sleep apnea    LOST CORD TO CPAP -ONLY 02 @ BEDTIME   Tubular adenoma of colon    Type II diabetes mellitus (HCC)     Medications:  (Not in a hospital admission)   Assessment: Pharmacy consulted to dose heparin in this 65year old male admitted with ACS/NSTEMI.   Pt was on Eliquis at home, last dose 6/11 @ 1900.  Goal of Therapy:  Heparin level 0.3-0.7 units/ml aPTT = 66 - 102  Monitor platelets by anticoagulation protocol: Yes   Plan:  Pt was on Eliquis 5 mg PO BID at home, last dose on 6/11 @ 1900.  Will not bolus this pt.  Will use aPTT to guide dosing  until HL and aPTT correlate.  Will draw aPTT 6 hrs after start of drip. Will check HL daily.  Start heparin infusion at 1050 units/hr Continue to monitor H&H and platelets  Dmiyah Liscano D 04/26/2021,10:34 PM

## 2021-04-26 NOTE — ED Triage Notes (Signed)
Pt from home via ems, reports cp x3hrs while washing 3lb dog radiating to L arm with 3-5 digits being numb. Took asa and nitro x4 w/o relief of 8/10 pain. Baseline oxygen use 2L continuous, has been off x1wk due to 'pmd reordering it'

## 2021-04-26 NOTE — ED Notes (Signed)
Pt states 'one of my stents are probably clogged, last time I had a heart cath they said it was getting close.' pt reports no change in pain, pt aware no other pain meds ordered at this time

## 2021-04-26 NOTE — H&P (Signed)
  Lawrence [name] MRN:[id number] DOB: 02/21/1956 DOA: 04/26/2021     PCP: [name], Donald E, MD   Outpatient Specialists:   CARDS:  Dr. Fath    Patient arrived to ER on 04/26/21 at 2208 Referred by Attending Stafford, Phillip, MD   Patient coming from: home Lives alone,       Chief Complaint: Chest pain   HPI: Lawrence [name] is a 65 y.o. male with medical history significant of  a.fib, Asthma, CAD, CHF,   HTN COPD on 2 L oxygen  Presented with   chest pain x 3 h nausea SOB while washing his dog, pain was radiating to left arm Patient took aspirin and 4 of nitrous but did not seem to help.  He is on baseline of 2 L of oxygen.  But has been off of his oxygen for at least a week because he could not order it. Pain occurred around 7 PM.  No associated syncope, pt ws cleaning the cage and started to have chest pain he rested a little bit and felt better but soon pain came back and did not improve with aspirin or nitro so he called 911      Has been vaccinated against COVID  and boosted   Initial COVID TEST  NEGATIVE   Lab Results  Component Value Date   SARSCOV2NAA NEGATIVE 04/26/2021   SARSCOV2NAA NEGATIVE 12/11/2020   SARSCOV2NAA NEGATIVE 10/06/2020   SARSCOV2NAA NEGATIVE 09/26/2020     Regarding pertinent Chronic problems:    Hyperlipidemia -  on statins Lipitor Lipid Panel     Component Value Date/Time   CHOL 110 12/12/2020 0545   CHOL 151 01/10/2018 1041   CHOL 92 05/23/2014 0415   TRIG 224 (H) 12/12/2020 0545   TRIG 109 05/23/2014 0415   HDL 23 (L) 12/12/2020 0545   HDL 38 (L) 01/10/2018 1041   HDL 28 (L) 05/23/2014 0415   CHOLHDL 4.8 12/12/2020 0545   VLDL 45 (H) 12/12/2020 0545   VLDL 22 05/23/2014 0415   LDLCALC 42 12/12/2020 0545   LDLCALC 56 01/10/2018 1041   LDLCALC 42 05/23/2014 0415   LDLDIRECT 122.1 (H) 09/27/2020 0306   LABVLDL 57 (H) 01/10/2018 1041     HTN on Norvasc Imdur lisinopril   chronic CHF diastolic/systolic combined- last echo  2021 EF 45 to 50% Farxiga torsemide   CAD status post CABG- On Aspirin, statin, betablocker,  On Ranexa                -  followed by cardiology                Last cardiac catheterization was in 2021     DM 2 -  Lab Results  Component Value Date   HGBA1C 6.4 (A) 03/10/2021   on PO meds only,       obesity-   BMI Readings from Last 1 Encounters:  04/26/21 34.46 kg/m    COPD - not **followed by pulmonology   on baseline oxygen  2L,      OSA -on nocturnal  CPAP,       A. Fib -  - CHA2DS2 vas score  5      current  on anticoagulation with  Eliquis, sotalol       While in ER: Noted to have elevated BP  Troponin unremarkable 19 Started on heparin drip and nitroglycerin drip     ED Triage Vitals  Enc Vitals Group     BP 04/26/21   2212 (!) 189/102     Pulse Rate 04/26/21 2212 64     Resp 04/26/21 2212 16     Temp 04/26/21 2212 98.1 F (36.7 C)     Temp Source 04/26/21 2212 Oral     SpO2 04/26/21 2212 95 %     Weight 04/26/21 2214 220 lb 0.3 oz (99.8 kg)     Height 04/26/21 2214 5' 7" (1.702 m)     Head Circumference --      Peak Flow --      Pain Score 04/26/21 2213 9     Pain Loc --      Pain Edu? --      Excl. in GC? --   TMAX(24)@     _________________________________________ Significant initial  Findings: Abnormal Labs Reviewed  COMPREHENSIVE METABOLIC PANEL - Abnormal; Notable for the following components:      Result Value   Glucose, Bld 132 (*)    AST 14 (*)    All other components within normal limits  TROPONIN I (HIGH SENSITIVITY) - Abnormal; Notable for the following components:   Troponin I (High Sensitivity) 19 (*)    All other components within normal limits   ____________________________________________ Ordered    CXR -  NON acute    _________________________ Troponin 19 ECG: Ordered Personally reviewed by me showing: HR : 63 Rhythm:  NSR,    no evidence of ischemic changes QTC 416   The recent clinical data is shown  below. Vitals:   04/26/21 2212 04/26/21 2214 04/26/21 2230 04/26/21 2330  BP: (!) 189/102  (!) 169/97 (!) 160/99  Pulse: 64  60 65  Resp: 16  14 15  Temp: 98.1 F (36.7 C)     TempSrc: Oral     SpO2: 95%  98% 96%  Weight:  99.8 kg    Height:  5' 7" (1.702 m)      WBC     Component Value Date/Time   WBC 7.6 04/26/2021 2213   LYMPHSABS 2.6 04/26/2021 2213   LYMPHSABS 1.9 03/07/2015 1851   MONOABS 0.6 04/26/2021 2213   MONOABS 0.6 03/07/2015 1851   EOSABS 0.2 04/26/2021 2213   EOSABS 0.4 03/07/2015 1851   BASOSABS 0.1 04/26/2021 2213   BASOSABS 0.1 03/07/2015 1851      UA  ordered    Results for orders placed or performed during the hospital encounter of 04/26/21  Resp Panel by RT-PCR (Flu A&B, Covid) Nasopharyngeal Swab     Status: None   Collection Time: 04/26/21 10:13 PM   Specimen: Nasopharyngeal Swab; Nasopharyngeal(NP) swabs in vial transport medium  Result Value Ref Range Status   SARS Coronavirus 2 by RT PCR NEGATIVE NEGATIVE Final         Influenza A by PCR NEGATIVE NEGATIVE Final   Influenza B by PCR NEGATIVE NEGATIVE Final            _______________________________________________ Hospitalist was called for admission for chest pain evaluation and hypertensive urgency  The following Work up has been ordered so far:  Orders Placed This Encounter  Procedures   Critical Care   Resp Panel by RT-PCR (Flu A&B, Covid) Nasopharyngeal Swab   DG Chest Portable 1 View   Comprehensive metabolic panel   Lipase, blood   CBC with Differential   Heparin level (unfractionated)   Protime-INR   APTT   Heparin level (unfractionated)   APTT   heparin per pharmacy consult   Consult to hospitalist        Following Medications were ordered in ER: Medications  nitroGLYCERIN 50 mg in dextrose 5 % 250 mL (0.2 mg/mL) infusion (50 mcg/min Intravenous Rate/Dose Change 04/26/21 2335)  heparin ADULT infusion 100 units/mL (25000 units/24m) (1,050 Units/hr Intravenous New  Bag/Given 04/26/21 2249)  morphine 4 MG/ML injection 4 mg (has no administration in time range)  ondansetron (ZOFRAN) injection 4 mg (has no administration in time range)        Consult Orders  (From admission, onward)           Start     Ordered   04/26/21 2305  Consult to hospitalist  Once       Provider:  (Not yet assigned)  Question Answer Comment  Place call to: ED, [phone]  Reason for Consult Admit   Diagnosis/Clinical Info for Consult: chest pain, unstable angina      04/26/21 2304              OTHER Significant initial  Findings:  labs showing:    Recent Labs  Lab 04/26/21 2213  NA 140  K 3.6  CO2 32  GLUCOSE 132*  BUN 9  CREATININE 0.69  CALCIUM 9.2    Cr   stable,   Lab Results  Component Value Date   CREATININE 0.69 04/26/2021   CREATININE 0.81 12/10/2020   CREATININE 0.91 11/23/2020    Recent Labs  Lab 04/26/21 2213  AST 14*  ALT 14  ALKPHOS 70  BILITOT 1.1  PROT 6.9  ALBUMIN 4.0   Lab Results  Component Value Date   CALCIUM 9.2 04/26/2021   PHOS 3.1 05/23/2020           Plt: Lab Results  Component Value Date   PLT 180 04/26/2021        Recent Labs  Lab 04/26/21 2213  WBC 7.6  NEUTROABS 4.1  HGB 17.0  HCT 51.2  MCV 94.5  PLT 180    HG/HCT * stable,  Down *Up from baseline see below    Component Value Date/Time   HGB 17.0 04/26/2021 2213   HGB 20.5 (HH) 09/24/2020 1105   HCT 51.2 04/26/2021 2213   HCT 62.2 (H) 09/24/2020 1105   MCV 94.5 04/26/2021 2213   MCV 93 09/24/2020 1105   MCV 89 03/07/2015 1851      Recent Labs  Lab 04/26/21 2213  LIPASE 30    BNP (last 3 results) Recent Labs    12/11/20 0845  BNP 52.8      DM  labs:  HbA1C: Recent Labs    09/24/20 1105 12/12/20 0545 03/10/21 1106  HGBA1C 9.6* 7.5* 6.4*       CBG (last 3)  No results for input(s): GLUCAP in the last 72 hours.        Cultures:    Component Value Date/Time   SDES BLOOD LEFT ANTECUBITAL 11/11/2018  0251   SPECREQUEST  11/11/2018 0251    BOTTLES DRAWN AEROBIC AND ANAEROBIC Blood Culture results may not be optimal due to an excessive volume of blood received in culture bottles   CULT  11/11/2018 0251    NO GROWTH 5 DAYS Performed at [facility] [address] [place] [place] [address]   REPTSTATUS 11/16/2018 FINAL 11/11/2018 0251     Radiological Exams on Admission: DG Chest Portable 1 View  Result Date: 04/26/2021 CLINICAL DATA:  Chest pain EXAM: PORTABLE CHEST 1 VIEW COMPARISON:  12/10/2020 FINDINGS: Prior CABG. Heart and mediastinal contours are within normal limits. Scarring  in the lingula. Right lung clear. No effusions. Heart is normal size. No acute bony abnormality. IMPRESSION: No active disease. Electronically Signed   By: Kevin  Dover M.D.   On: 04/26/2021 22:35   _______________________________________________________________________________________________________ Latest  Blood pressure (!) 160/99, pulse 65, temperature 98.1 F (36.7 C), temperature source Oral, resp. rate 15, height 5' 7" (1.702 m), weight 99.8 kg, SpO2 96 %.   Review of Systems:    Pertinent positives include: chest pain, nausea,  Constitutional:  No weight loss, night sweats, Fevers, chills, fatigue, weight loss  HEENT:  No headaches, Difficulty swallowing,Tooth/dental problems,Sore throat,  No sneezing, itching, ear ache, nasal congestion, post nasal drip,  Cardio-vascular:  No Orthopnea, PND, anasarca, dizziness, palpitations.no Bilateral lower extremity swelling  GI:  No heartburn, indigestion, abdominal pain,  vomiting, diarrhea, change in bowel habits, loss of appetite, melena, blood in stool, hematemesis Resp:  no shortness of breath at rest. No dyspnea on exertion, No excess mucus, no productive cough, No non-productive cough, No coughing up of blood.No change in color of mucus.No wheezing. Skin:  no rash or lesions. No jaundice GU:  no dysuria, change in color of urine, no  urgency or frequency. No straining to urinate.  No flank pain.  Musculoskeletal:  No joint pain or no joint swelling. No decreased range of motion. No back pain.  Psych:  No change in mood or affect. No depression or anxiety. No memory loss.  Neuro: no localizing neurological complaints, no tingling, no weakness, no double vision, no gait abnormality, no slurred speech, no confusion  All systems reviewed and apart from HOPI all are negative _______________________________________________________________________________________________ Past Medical History:   Past Medical History:  Diagnosis Date   A-fib (HCC)    Anemia    Anginal pain (HCC)    Anxiety    Arthritis    Asthma    CAD (coronary artery disease)    a. 2002 CABGx2 (LIMA->LAD, VG->VG->OM1);  b. 09/2012 DES->OM;  c. 03/2015 PTCA of LAD ([facility]) in setting of atretic LIMA; d. 05/2015 Cath ([facility]): nonobs dzs; e. 06/2015 Cath ([facility]): LM nl, LAD 45p/d ISR, 50d, D1/2 small, LCX 50p/d ISR, OM1 70ost, 30 ISR, VG->OM1 50ost, 10m, LIMA->LAD 99p/d - atretic, RCA dom, nl; f.cath 10/16: 40-50%(FFR 0.90) pLAD, 75% (FFR 0.77) mLAD s/p PCI/DES, oRCA 40% (FFR0.95)   Cancer (HCC)    SKIN CANCER ON BACK   Celiac disease    Chronic diastolic CHF (congestive heart failure) (HCC)    a. 06/2009 Echo: EF 60-65%, Gr 1 DD, triv AI, mildly dil LA, nl RV.   COPD (chronic obstructive pulmonary disease) (HCC)    a. Chronic bronchitis and emphysema.   DDD (degenerative disc disease), lumbar    Diverticulosis    Dysrhythmia    Essential hypertension    GERD (gastroesophageal reflux disease)    History of hiatal hernia    History of kidney stones    H/O   History of tobacco abuse    a. Quit 2014.   Myocardial infarction (HCC) 2002   4 STENTS   Pancreatitis    PSVT (paroxysmal supraventricular tachycardia) (HCC)    a. 10/2012 Noted on Zio Patch.   Sleep apnea    LOST CORD TO CPAP -ONLY 02 @ BEDTIME   Tubular adenoma of colon    Type II diabetes  mellitus (HCC)       Past Surgical History:  Procedure Laterality Date   BYPASS GRAFT     CARDIAC CATHETERIZATION N/A 07/12/2015   rocedure:   Left Heart Cath and Cors/Grafts Angiography;  Surgeon: Belva Crome, MD;  Location: [place] [facility];  Service: Cardiovascular;  Laterality: N/A;   CARDIAC CATHETERIZATION Right 10/07/2015   Procedure: Left Heart Cath and Cors/Grafts Angiography;  Surgeon: Dionisio David, MD;  Location: [place] [facility];  Service: Cardiovascular;  Laterality: Right;   CARDIAC CATHETERIZATION N/A 04/06/2016   Procedure: Left Heart Cath and Coronary Angiography;  Surgeon: Yolonda Kida, MD;  Location: [place] [facility];  Service: Cardiovascular;  Laterality: N/A;   CARDIAC CATHETERIZATION  04/06/2016   Procedure: Bypass Graft Angiography;  Surgeon: Yolonda Kida, MD;  Location: [place] [facility];  Service: Cardiovascular;;   CARDIAC CATHETERIZATION N/A 11/02/2016   Procedure: Left Heart Cath and Cors/Grafts Angiography and possible PCI;  Surgeon: Yolonda Kida, MD;  Location: [place] [facility];  Service: Cardiovascular;  Laterality: N/A;   CARDIAC CATHETERIZATION N/A 11/02/2016   Procedure: Coronary Stent Intervention;  Surgeon: Yolonda Kida, MD;  Location: [place] [facility];  Service: Cardiovascular;  Laterality: N/A;   CHOLECYSTECTOMY     CIRCUMCISION N/A 06/09/2019   Procedure: CIRCUMCISION ADULT;  Surgeon: Billey Co, MD;  Location: [facility];  Service: Urology;  Laterality: N/A;   COLONOSCOPY WITH PROPOFOL N/A 04/01/2018   Procedure: COLONOSCOPY WITH PROPOFOL;  Surgeon: Manya Silvas, MD;  Location: [facility] END[facility];  Service: Endoscopy;  Laterality: N/A;   ESOPHAGEAL DILATION     ESOPHAGOGASTRODUODENOSCOPY (EGD) WITH PROPOFOL N/A 04/01/2018   Procedure: ESOPHAGOGASTRODUODENOSCOPY (EGD) WITH PROPOFOL;  Surgeon: Manya Silvas, MD;  Location: [facility] END[facility];  Service: Endoscopy;  Laterality: N/A;   LEFT HEART CATH AND  CORS/GRAFTS ANGIOGRAPHY N/A 06/12/2019   Procedure: LEFT HEART CATH AND CORS/GRAFTS ANGIOGRAPHY;  Surgeon: Teodoro Spray, MD;  Location: [place] [facility];  Service: Cardiovascular;  Laterality: N/A;   LEFT HEART CATH AND CORS/GRAFTS ANGIOGRAPHY N/A 03/11/2020   Procedure: LEFT HEART CATH AND CORS/GRAFTS ANGIOGRAPHY;  Surgeon: Isaias Cowman, MD;  Location: [place] [facility];  Service: Cardiovascular;  Laterality: N/A;   TONSILLECTOMY     VASCULAR SURGERY      Social History:  Ambulatory  independently      reports that he quit smoking about 8 years ago. His smoking use included cigarettes. He has a 150.00 pack-year smoking history. He has never used smokeless tobacco. He reports current drug use. Drug: Marijuana. He reports that he does not drink alcohol.     Family History:   Family History  Problem Relation Age of Onset   Heart attack Mother    Depression Mother    Heart disease Mother    COPD Mother    Hypertension Mother    Heart attack Father    Diabetes Father    Depression Father    Heart disease Father    Cirrhosis Father    Parkinson's disease Brother    ______________________________________________________________________________________________ Allergies: Allergies  Allergen Reactions   Demerol  [Meperidine Hcl]    Demerol [Meperidine] Hives   Jardiance [Empagliflozin] Other (See Comments)    Perineal pain   Prednisone Other (See Comments) and Hypertension    Pt states that this medication puts him in A-fib    Sulfa Antibiotics Hives   Albuterol Sulfate [Albuterol] Palpitations and Other (See Comments)    Pt currently uses this medication.     Morphine Sulfate Nausea And Vomiting, Rash and Other (See Comments)    Pt states that he is only allergic to the tablet  form of this medication.       Prior to Admission medications   Medication Sig Start Date End Date Taking? Authorizing Provider  oxyCODONE-acetaminophen (PERCOCET) 10-325 MG tablet  TAKE 1 TABLET BY MOUTH EVERY 4 HOURS AS NEEDED FOR PAIN 04/09/21   [name], Donald E, MD  Accu-Chek Softclix Lancets lancets Use as instructed to check sugar daily for type 2 diabetes. 03/03/21   [name], Donald E, MD  albuterol (VENTOLIN HFA) 108 (90 Base) MCG/ACT inhaler INHALE 2 PUFFS BY MOUTH EVERY 6 HOURS AS NEEDED FOR SHORTNESS OF BREATH 05/13/20   [name], Donald E, MD  allopurinol (ZYLOPRIM) 300 MG tablet TAKE 1 TABLET BY MOUTH TWICE A DAY 02/05/21   [name], Donald E, MD  ALPRAZolam (XANAX) 1 MG tablet Take 1 tablet (1 mg total) by mouth 3 (three) times daily. 03/10/21   [name], Donald E, MD  amLODipine (NORVASC) 2.5 MG tablet Take 1 tablet (2.5 mg total) by mouth daily. 09/24/20   [name], Donald E, MD  aspirin 81 MG chewable tablet Chew 81 mg by mouth daily. Swallows whole    [provider]  atorvastatin (LIPITOR) 80 MG tablet TAKE ONE TABLET BY MOUTH AT BEDTIME 02/11/21   [name], Donald E, MD  Blood Glucose Calibration (ACCU-CHEK GUIDE CONTROL) LIQD Use with blood glucose monitor as directed 02/20/21   [name], Donald E, MD  Blood Glucose Monitoring Suppl (ACCU-CHEK GUIDE) w/Device KIT Use to check blood sugars as directed 02/20/21   [name], Donald E, MD  BREO ELLIPTA 100-25 MCG/INH AEPB INHALE 1 PUFF INTO THE LUNGS DAILY 03/19/21   [name], Donald E, MD  budesonide (PULMICORT) 0.5 MG/2ML nebulizer solution Take 0.5 mg by nebulization 2 (two) times daily as needed (shortness of breath or wheezing).    [provider]  cetirizine (ZYRTEC) 10 MG tablet TAKE ONE TABLET BY MOUTH AT BEDTIME 12/10/20   [name], Donald E, MD  docusate sodium (COLACE) 100 MG capsule Take 100 mg by mouth daily as needed for mild constipation.    [provider]  ELIQUIS 5 MG TABS tablet TAKE 1 TABLET BY MOUTH TWICE A DAY 05/01/20   [name], Donald E, MD  FARXIGA 10 MG TABS tablet TAKE 1 TABLET BY MOUTH DAILY Patient taking differently: Take 10 mg by mouth daily. 06/13/20   [name], Donald E, MD  glucose blood  (ACCU-CHEK GUIDE) test strip Use as instructed to check sugar daily for type 2 diabetes. 03/03/21   [name], Donald E, MD  isosorbide mononitrate (IMDUR) 120 MG 24 hr tablet TAKE 1 TABLET BY MOUTH DAILY 01/22/21   [name], Donald E, MD  LINZESS 72 MCG capsule Take 72 mcg by mouth daily before breakfast.     [provider]  lisinopril (ZESTRIL) 10 MG tablet TAKE 1 TABLET BY MOUTH DAILY 08/19/20   [name], Donald E, MD  magnesium oxide (MAG-OX) 400 (241.3 Mg) MG tablet TAKE 1 TABLET BY MOUTH EVERY MORNING 01/18/21   [name], Donald E, MD  metFORMIN (GLUCOPHAGE-XR) 500 MG 24 hr tablet Take 500 mg by mouth 2 (two) times daily. 11/19/20   [provider]  methocarbamol (ROBAXIN) 500 MG tablet Take 1 tablet (500 mg total) by mouth every 8 (eight) hours as needed for muscle spasms. 03/10/21   [name], Donald E, MD  naloxone (NARCAN) nasal spray 4 mg/0.1 mL 1 spray into nostril x1 and may repeat every 2-3 minutes until patient is responsive or EMS arrives 08/21/20   [name], Donald E, MD  nitroGLYCERIN (NITROSTAT) 0.4 MG   SL tablet PLACE ONE TABLET UNDER THE TONGUE EVERY 5 MINUTES FOR CHEST PAIN. IF NO RELIEF PAST 3RD TAB GO TO EMERGENCY ROOM 05/10/20   [name], Donald E, MD  omega-3 acid ethyl esters (LOVAZA) 1 g capsule TAKE 4 CAPSULES BY MOUTH DAILY 08/12/20   [name], Donald E, MD  omeprazole (PRILOSEC) 40 MG capsule Take 40 mg by mouth daily.  03/19/20   [provider]  promethazine (PHENERGAN) 25 MG tablet Take 1 tablet (25 mg total) by mouth every 8 (eight) hours as needed for nausea or vomiting. 12/16/20   [name], Donald E, MD  ranolazine (RANEXA) 1000 MG SR tablet Take 1 tablet (1,000 mg total) by mouth 2 (two) times daily. 04/02/21   [name], Donald E, MD  sotalol (BETAPACE) 80 MG tablet Take 40 mg by mouth 2 (two) times daily.    [provider]  SPIRIVA HANDIHALER 18 MCG inhalation capsule PLACE 1 CAPSULE INTO INHALER AND INHALE BY MOUTH ONCE A DAY. 03/19/21   [name], Donald E, MD   torsemide (DEMADEX) 100 MG tablet Take 0.5 tablets (50 mg total) by mouth daily. 05/13/20   [name], Donald E, MD  traZODone (DESYREL) 150 MG tablet TAKE 1 TABLET BY MOUTH AT BEDTIME 04/21/21   [name], Donald E, MD  venlafaxine XR (EFFEXOR-XR) 75 MG 24 hr capsule TAKE 1 CAPSULE BY MOUTH DAILY WITH BREAKFAST 08/19/20   [name], Donald E, MD    ___________________________________________________________________________________________________ Physical Exam: Vitals with BMI 04/26/2021 04/26/2021 04/26/2021  Height - - 5' 7"  Weight - - 220 lbs  BMI - - 34.45  Systolic 160 169 -  Diastolic 99 97 -  Pulse 65 60 -     1. General:  in No  Acute distress   Chronically ill   -appearing 2. Psychological: Alert and Oriented 3. Head/ENT:   Dry Mucous Membranes                          Head Non traumatic, neck supple                           Poor Dentition 4. SKIN:  decreased Skin turgor,  Skin clean Dry and intact no rash 5. Heart: Regular rate and rhythm no Murmur, no Rub or gallop 6. Lungs:   no wheezes or crackles   7. Abdomen: Soft,   non-tender, Non distended  bowel sounds present 8. Lower extremities: no clubbing, cyanosis, no  edema 9. Neurologically Grossly intact, moving all 4 extremities equally   10. MSK: Normal range of motion    Chart has been reviewed  ______________________________________________________________________________________________  Assessment/Plan   65 y.o. male with medical history significant of  a.fib, Asthma, CAD, CHF,   HTN COPD on 2 L oxygen Admitted for CP and hypertensive urgency  Present on Admission:  Chest pain vs unstable angina started on heparin by ER - - H=  2  ,E= 0 , A= 2  , R  2  , T 0 ,  for the  Total of  6 therefore will admit for observation and further evaluation ( Risk of MACE: Scores 0-3  of 0.9-1.7%.,  4-6: 12-16.6% , Scores ?7: 50-65% )   - troponin 19  -No acute ischemic changes on ECG   -  Other explanation for chest pain could  be GI related versus musculoskeletal   - monitor on telemetry, cycle cardiac enzymes, obtain serial ECG and    ECHO in AM.   - Daily aspirin -  Further risk stratify with lipid panel, hgA1C, obtain TSH.  Make sure patient is on Aspirin.  We will notify cardiology regarding patient's admission. Further management depends on pending  workup    Hypertensive urgency - on nitro dripp and admit to progressive cardiac unit Avoid over aggressive drop Resume home meds   Polycythemia - chronic stable, will need follow up  with hematology to make sure not contributing for clotting    Paroxysmal atrial fibrillation (HCC) - was on eliquis now on heparin, HR stable now in sinus   OSA (obstructive sleep apnea) - CPAP      HLD (hyperlipidemia) - continue Lipitor   GERD (gastroesophageal reflux disease) -PPI   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HCC) -  - Order Sensitive  SSI   -  check TSH and HgA1C  - Hold by mouth medication   COPD (chronic obstructive pulmonary disease) (HCC) -chronic stable continue home meds   Chronic systolic CHF (congestive heart failure) (HCC) - stable resume home meds   CAD (coronary artery disease) - continue aspirin, lipitor Appreciate cardiology consult   Other plan as per orders.  DVT prophylaxis:  on heparin    Code Status:    Code Status: Prior FULL CODE  as per patient  I had personally discussed CODE STATUS with patient      Family Communication:   Family not at  Bedside    Disposition Plan:    To home once workup is complete and patient is stable   Following barriers for discharge:                            Electrolytes corrected                               BP stable CP resolves                           Will need consultants to evaluate patient prior to discharge     Would benefit from PT/OT eval prior to DC  Ordered                                       Consults called: sent msg to Cardiology   Admission status:  ED  Disposition     ED Disposition  Admit   Condition  --   Comment  Hospital Area: [facility] [[id number]]  Level of Care: Progressive Cardiac [[id number]]  Admit to Progressive based on following criteria: CARDIOVASCULAR & THORACIC of moderate stability with acute coronary syndrome symptoms/low risk myocardial infarction/hypertensive urgency/arrhythmias/heart failure potentially compromising stability and stable post cardiovascular intervention patients.  Covid Evaluation: Asymptomatic Screening Protocol (No Symptoms)  Diagnosis: Chest pain [744799]  Admitting Physician: ,  [[id number]]  Attending Physician: ,  [[id number]]           Obs       Level of care   progressive tele indefinitely please discontinue once patient no longer qualifies COVID-19 Labs    Lab Results  Component Value Date   SARSCOV2NAA NEGATIVE 04/26/2021     Precautions: admitted as  Covid Negative     PPE: Used by the provider:   N95  eye Goggles,    Gloves       04/27/2021, 1:29 AM    [facility]     after 2 AM please page floor coverage PA If 7AM-7PM, please contact the day team taking care of the patient using [email or web address]   Patient was evaluated in the context of the global COVID-19 pandemic, which necessitated consideration that the patient might be at risk for infection with the SARS-CoV-2 virus that causes COVID-19. Institutional protocols and algorithms that pertain to the evaluation of patients at risk for COVID-19 are in a state of rapid change based on information released by regulatory bodies including the CDC and federal and state organizations. These policies and algorithms were followed during the patient's care.      

## 2021-04-26 NOTE — ED Provider Notes (Signed)
-----------------------------------------   11:57 PM on 04/26/2021 -----------------------------------------  Discussed case by phone with Dr. Roel Cluck from the hospitalist service.  She will admit.  Patient still reporting chest pain and Dr. Joni Fears ordered morphine 4 mg IV. BP improved on NTG gtt.   Hinda Kehr, MD 04/26/21 2358

## 2021-04-26 NOTE — ED Provider Notes (Signed)
[facility] Emergency Department Provider Note  ____________________________________________  Time seen: Approximately 11:23 PM  I have reviewed the triage vital signs and the nursing notes.   HISTORY  Chief Complaint No chief complaint on file.    HPI Lawrence [name] is a 65 y.o. male with a past history of atrial fibrillation, CAD, CABG, COPD who reports being in his usual state of health until about 7 PM tonight when he started having severe central chest pain radiating down the left arm, associated shortness of breath and diaphoresis.  Worse with standing and walking, better with sitting and resting.  Took 324 of aspirin, 3 nitroglycerin without relief.  Pain is still 6/10 currently.  No radiation to the back or abdomen.  No dizziness or syncope.    Past Medical History:  Diagnosis Date   A-fib ([place])    Anemia    Anginal pain ([place])    Anxiety    Arthritis    Asthma    CAD (coronary artery disease)    a. 2002 CABGx2 (LIMA->LAD, VG->VG->OM1);  b. 09/2012 DES->OM;  c. 03/2015 PTCA of LAD [facility]) in setting of atretic LIMA; d. 05/2015 Cath [facility]): nonobs dzs; e. 06/2015 Cath ([facility]): LM nl, LAD 45p/d ISR, 50d, D1/2 small, LCX 50p/d ISR, OM1 70ost, 30 ISR, VG->OM1 50ost, 79m LIMA->LAD 99p/d - atretic, RCA dom, nl; f.cath 10/16: 40-50%(FFR 0.90) pLAD, 75% (FFR 0.77) mLAD s/p PCI/DES, oRCA 40% (FFR0.95)   Cancer (HCC)    SKIN CANCER ON BACK   Celiac disease    Chronic diastolic CHF (congestive heart failure) ([place]    a. 06/2009 Echo: EF 60-65%, Gr 1 DD, triv AI, mildly dil LA, nl RV.   COPD (chronic obstructive pulmonary disease) ([place]    a. Chronic bronchitis and emphysema.   DDD (degenerative disc disease), lumbar    Diverticulosis    Dysrhythmia    Essential hypertension    GERD (gastroesophageal reflux disease)    History of hiatal hernia    History of kidney stones    H/O   History of tobacco abuse    a. Quit 2014.   Myocardial infarction ([facility] 2002    4 STENTS   Pancreatitis    PSVT (paroxysmal supraventricular tachycardia) ([place]    a. 10/2012 Noted on [facility].   Sleep apnea    LOST CORD TO CPAP -ONLY 02 @ BEDTIME   Tubular adenoma of colon    Type II diabetes mellitus ([place]      Patient Active Problem List   Diagnosis Date Noted   Atherosclerosis of aorta ([place] 12/16/2020   Syncope 12/11/2020   Acidosis 12/11/2020   Polycythemia 10/05/2020   NSTEMI (non-ST elevated myocardial infarction) ([place] 09/26/2020   Primary insomnia 05/13/2020   Recurrent major depressive disorder, remission status unspecified ([place] 03/29/2020   Elevated troponin 03/07/2020   Bereavement 09/09/2019   Chronic respiratory failure with hypoxia ([place] 05/29/2019   Morbid obesity ([place] 05/29/2019   Trigger thumb of right hand 11/28/2018   Eye pain, left 08/18/2018   Acute on chronic heart failure ([place] 04/18/2018   History of adenomatous polyp of colon 04/05/2018   Abdominal pain, chronic, epigastric 11/06/2017   Bilateral flank pain 03/24/2017   Dyspnea 04/03/2016   Hypotension 04/03/2016   CKD (chronic kidney disease) stage 2, GFR 60-89 ml/min 04/03/2016   Anemia 04/03/2016   Paroxysmal atrial fibrillation ([place] 12/23/2015   OSA (obstructive sleep apnea) 12/10/2015   Left inguinal hernia 11/07/2015   Anxiety 11/07/2015  Unstable angina ([place]) 10/05/2015   Back pain with left-sided radiculopathy 09/30/2015   Nocturnal hypoxia 09/06/2015   BPH (benign prostatic hyperplasia) 16/08/9603   Chronic systolic CHF (congestive heart failure) (HCC)    Angina pectoris ([place])    Chest pain 07/11/2015   COPD (chronic obstructive pulmonary disease) ([place]) 07/03/2015   CAD (coronary artery disease) 06/26/2015   HTN (hypertension) 06/26/2015   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[place]) 06/26/2015   Achalasia 07/24/2014   GERD (gastroesophageal reflux disease) 06/07/2014   Former tobacco use 04/11/2013   HLD (hyperlipidemia) 04/09/2013     Past  Surgical History:  Procedure Laterality Date   BYPASS GRAFT     CARDIAC CATHETERIZATION N/A 07/12/2015   rocedure: Left Heart Cath and Cors/Grafts Angiography;  Surgeon: Belva Crome, MD;  Location: [place] CV LAB;  Service: Cardiovascular;  Laterality: N/A;   CARDIAC CATHETERIZATION Right 10/07/2015   Procedure: Left Heart Cath and Cors/Grafts Angiography;  Surgeon: Dionisio David, MD;  Location: [place] CV LAB;  Service: Cardiovascular;  Laterality: Right;   CARDIAC CATHETERIZATION N/A 04/06/2016   Procedure: Left Heart Cath and Coronary Angiography;  Surgeon: Yolonda Kida, MD;  Location: [place] CV LAB;  Service: Cardiovascular;  Laterality: N/A;   CARDIAC CATHETERIZATION  04/06/2016   Procedure: Bypass Graft Angiography;  Surgeon: Yolonda Kida, MD;  Location: [place] CV LAB;  Service: Cardiovascular;;   CARDIAC CATHETERIZATION N/A 11/02/2016   Procedure: Left Heart Cath and Cors/Grafts Angiography and possible PCI;  Surgeon: Yolonda Kida, MD;  Location: [place] CV LAB;  Service: Cardiovascular;  Laterality: N/A;   CARDIAC CATHETERIZATION N/A 11/02/2016   Procedure: Coronary Stent Intervention;  Surgeon: Yolonda Kida, MD;  Location: [place] CV LAB;  Service: Cardiovascular;  Laterality: N/A;   CHOLECYSTECTOMY     CIRCUMCISION N/A 06/09/2019   Procedure: CIRCUMCISION ADULT;  Surgeon: Billey Co, MD;  Location: [facility] ORS;  Service: Urology;  Laterality: N/A;   COLONOSCOPY WITH PROPOFOL N/A 04/01/2018   Procedure: COLONOSCOPY WITH PROPOFOL;  Surgeon: Manya Silvas, MD;  Location: [facility] ENDOSCOPY;  Service: Endoscopy;  Laterality: N/A;   ESOPHAGEAL DILATION     ESOPHAGOGASTRODUODENOSCOPY (EGD) WITH PROPOFOL N/A 04/01/2018   Procedure: ESOPHAGOGASTRODUODENOSCOPY (EGD) WITH PROPOFOL;  Surgeon: Manya Silvas, MD;  Location: [facility] ENDOSCOPY;  Service: Endoscopy;  Laterality: N/A;   LEFT HEART CATH AND CORS/GRAFTS ANGIOGRAPHY N/A 06/12/2019    Procedure: LEFT HEART CATH AND CORS/GRAFTS ANGIOGRAPHY;  Surgeon: Teodoro Spray, MD;  Location: [place] CV LAB;  Service: Cardiovascular;  Laterality: N/A;   LEFT HEART CATH AND CORS/GRAFTS ANGIOGRAPHY N/A 03/11/2020   Procedure: LEFT HEART CATH AND CORS/GRAFTS ANGIOGRAPHY;  Surgeon: Isaias Cowman, MD;  Location: [place] CV LAB;  Service: Cardiovascular;  Laterality: N/A;   TONSILLECTOMY     VASCULAR SURGERY       Prior to Admission medications   Medication Sig Start Date End Date Taking? Authorizing Provider  oxyCODONE-acetaminophen (PERCOCET) 10-325 MG tablet TAKE 1 TABLET BY MOUTH EVERY 4 HOURS AS NEEDED FOR PAIN 04/09/21   Birdie Sons, MD  Accu-Chek Softclix Lancets lancets Use as instructed to check sugar daily for type 2 diabetes. 03/03/21   Birdie Sons, MD  albuterol (VENTOLIN HFA) 108 (90 Base) MCG/ACT inhaler INHALE 2 PUFFS BY MOUTH EVERY 6 HOURS AS NEEDED FOR SHORTNESS OF BREATH 05/13/20   Birdie Sons, MD  allopurinol (ZYLOPRIM) 300 MG tablet TAKE 1 TABLET BY MOUTH TWICE A DAY  02/05/21   Birdie Sons, MD  ALPRAZolam Duanne Moron) 1 MG tablet Take 1 tablet (1 mg total) by mouth 3 (three) times daily. 03/10/21   Birdie Sons, MD  amLODipine (NORVASC) 2.5 MG tablet Take 1 tablet (2.5 mg total) by mouth daily. 09/24/20   Birdie Sons, MD  aspirin 81 MG chewable tablet Chew 81 mg by mouth daily. Swallows whole    [provider]  atorvastatin (LIPITOR) 80 MG tablet TAKE ONE TABLET BY MOUTH AT BEDTIME 02/11/21   Birdie Sons, MD  Blood Glucose Calibration (ACCU-CHEK GUIDE CONTROL) LIQD Use with blood glucose monitor as directed 02/20/21   Birdie Sons, MD  Blood Glucose Monitoring Suppl (ACCU-CHEK GUIDE) w/Device KIT Use to check blood sugars as directed 02/20/21   Birdie Sons, MD  BREO ELLIPTA 100-25 MCG/INH AEPB INHALE 1 PUFF INTO THE LUNGS DAILY 03/19/21   Birdie Sons, MD  budesonide (PULMICORT) 0.5 MG/2ML nebulizer solution Take  0.5 mg by nebulization 2 (two) times daily as needed (shortness of breath or wheezing).    [provider]  cetirizine (ZYRTEC) 10 MG tablet TAKE ONE TABLET BY MOUTH AT BEDTIME 12/10/20   Birdie Sons, MD  docusate sodium (COLACE) 100 MG capsule Take 100 mg by mouth daily as needed for mild constipation.    [provider]  ELIQUIS 5 MG TABS tablet TAKE 1 TABLET BY MOUTH TWICE A DAY 05/01/20   [name], [name], MD  FARXIGA 10 MG TABS tablet TAKE 1 TABLET BY MOUTH DAILY Patient taking differently: Take 10 mg by mouth daily. 06/13/20   Birdie Sons, MD  glucose blood (ACCU-CHEK GUIDE) test strip Use as instructed to check sugar daily for type 2 diabetes. 03/03/21   Birdie Sons, MD  isosorbide mononitrate (IMDUR) 120 MG 24 hr tablet TAKE 1 TABLET BY MOUTH DAILY 01/22/21   Birdie Sons, MD  LINZESS 72 MCG capsule Take 72 mcg by mouth daily before breakfast.     [provider]  lisinopril (ZESTRIL) 10 MG tablet TAKE 1 TABLET BY MOUTH DAILY 08/19/20   Birdie Sons, MD  magnesium oxide (MAG-OX) 400 (241.3 Mg) MG tablet TAKE 1 TABLET BY MOUTH EVERY MORNING 01/18/21   Birdie Sons, MD  metFORMIN (GLUCOPHAGE-XR) 500 MG 24 hr tablet Take 500 mg by mouth 2 (two) times daily. 11/19/20   [provider]  methocarbamol (ROBAXIN) 500 MG tablet Take 1 tablet (500 mg total) by mouth every 8 (eight) hours as needed for muscle spasms. 03/10/21   Birdie Sons, MD  naloxone [facility]) nasal spray 4 mg/0.1 mL 1 spray into nostril x1 and may repeat every 2-3 minutes until patient is responsive or EMS arrives 08/21/20   Birdie Sons, MD  nitroGLYCERIN (NITROSTAT) 0.4 MG SL tablet PLACE ONE TABLET UNDER THE TONGUE EVERY 5 MINUTES FOR CHEST PAIN. IF NO RELIEF PAST 3RD TAB GO TO EMERGENCY ROOM 05/10/20   Birdie Sons, MD  omega-3 acid ethyl esters (LOVAZA) 1 g capsule TAKE 4 CAPSULES BY MOUTH DAILY 08/12/20   Birdie Sons, MD  omeprazole (PRILOSEC) 40 MG capsule Take  40 mg by mouth daily.  03/19/20   [provider]  promethazine (PHENERGAN) 25 MG tablet Take 1 tablet (25 mg total) by mouth every 8 (eight) hours as needed for nausea or vomiting. 12/16/20   Birdie Sons, MD  ranolazine (RANEXA) 1000 MG SR tablet Take 1 tablet (1,000 mg total) by  mouth 2 (two) times daily. 04/02/21   Birdie Sons, MD  sotalol (BETAPACE) 80 MG tablet Take 40 mg by mouth 2 (two) times daily.    [provider]  SPIRIVA HANDIHALER 18 MCG inhalation capsule PLACE 1 CAPSULE INTO INHALER AND INHALE BY MOUTH ONCE A DAY. 03/19/21   Birdie Sons, MD  torsemide (DEMADEX) 100 MG tablet Take 0.5 tablets (50 mg total) by mouth daily. 05/13/20   Birdie Sons, MD  traZODone (DESYREL) 150 MG tablet TAKE 1 TABLET BY MOUTH AT BEDTIME 04/21/21   Birdie Sons, MD  venlafaxine XR (EFFEXOR-XR) 75 MG 24 hr capsule TAKE 1 CAPSULE BY MOUTH DAILY WITH BREAKFAST 08/19/20   Birdie Sons, MD     Allergies Demerol  [meperidine hcl], Demerol [meperidine], Jardiance [empagliflozin], Prednisone, Sulfa antibiotics, Albuterol sulfate [albuterol], and Morphine sulfate   Family History  Problem Relation Age of Onset   Heart attack Mother    Depression Mother    Heart disease Mother    COPD Mother    Hypertension Mother    Heart attack Father    Diabetes Father    Depression Father    Heart disease Father    Cirrhosis Father    Parkinson's disease Brother     Social History Social History   Tobacco Use   Smoking status: Former    Packs/day: 3.00    Years: 50.00    Pack years: 150.00    Types: Cigarettes    Quit date: 04/22/2013    Years since quitting: 8.0   Smokeless tobacco: Never   Tobacco comments:    Reports not smoking for approx 8 years.  Vaping Use   Vaping Use: Never used  Substance Use Topics   Alcohol use: No    Comment: remotely quit alcohol use. Hx of heavy alcohol use.   Drug use: Yes    Types: Marijuana    Comment: occasionally    Review  of Systems  Constitutional:   No fever or chills.  ENT:   No sore throat. No rhinorrhea. Cardiovascular:   Positive as above chest pain without syncope. Respiratory:   No dyspnea or cough. Gastrointestinal:   Negative for abdominal pain, vomiting and diarrhea.  Musculoskeletal:   Negative for focal pain or swelling All other systems reviewed and are negative except as documented above in ROS and HPI.  ____________________________________________   PHYSICAL EXAM:  VITAL SIGNS: ED Triage Vitals  Enc Vitals Group     BP 04/26/21 2212 (!) 189/102     Pulse Rate 04/26/21 2212 64     Resp 04/26/21 2212 16     Temp 04/26/21 2212 98.1 F (36.7 C)     Temp Source 04/26/21 2212 Oral     SpO2 04/26/21 2212 95 %     Weight 04/26/21 2214 220 lb 0.3 oz (99.8 kg)     Height 04/26/21 2214 5' 7" (1.702 m)     Head Circumference --      Peak Flow --      Pain Score 04/26/21 2213 9     Pain Loc --      Pain Edu? --      Excl. in [place]? --     Vital signs reviewed, nursing assessments reviewed.   Constitutional:   Alert and oriented. Non-toxic appearance. Eyes:   Conjunctivae are normal. EOMI. PERRL. ENT      Head:   Normocephalic and atraumatic.      Nose: Normal  Mouth/Throat:   Normal, moist mucosa      Neck:   No meningismus. Full ROM. Hematological/Lymphatic/Immunilogical:   No cervical lymphadenopathy. Cardiovascular:   RRR. Symmetric bilateral radial and DP pulses.  No murmurs. Cap refill less than 2 seconds. Respiratory:   Normal respiratory effort without tachypnea/retractions. Breath sounds are clear and equal bilaterally. No wheezes/rales/rhonchi. Gastrointestinal:   Soft and nontender. Non distended. There is no CVA tenderness.  No rebound, rigidity, or guarding. Genitourinary:   deferred Musculoskeletal:   Normal range of motion in all extremities. No joint effusions.  No lower extremity tenderness.  No edema. Neurologic:   Normal speech and language.  Motor grossly  intact. No acute focal neurologic deficits are appreciated.  Skin:    Skin is warm, dry and intact. No rash noted.  No petechiae, purpura, or bullae.  ____________________________________________    LABS (pertinent positives/negatives) (all labs ordered are listed, but only abnormal results are displayed) Labs Reviewed  COMPREHENSIVE METABOLIC PANEL - Abnormal; Notable for the following components:      Result Value   Glucose, Bld 132 (*)    AST 14 (*)    All other components within normal limits  TROPONIN I (HIGH SENSITIVITY) - Abnormal; Notable for the following components:   Troponin I (High Sensitivity) 19 (*)    All other components within normal limits  RESP PANEL BY RT-PCR (FLU A&B, COVID) ARPGX2  LIPASE, BLOOD  CBC WITH DIFFERENTIAL/PLATELET  HEPARIN LEVEL (UNFRACTIONATED)  PROTIME-INR  APTT  HEPARIN LEVEL (UNFRACTIONATED)  APTT   ____________________________________________   EKG  Interpreted by me Sinus rhythm rate of 63, normal axis and intervals.  Poor R wave progression.  Normal ST segments and T waves.  ____________________________________________    NIDPOEUMP  DG Chest Portable 1 View  Result Date: 04/26/2021 CLINICAL DATA:  Chest pain EXAM: PORTABLE CHEST 1 VIEW COMPARISON:  12/10/2020 FINDINGS: Prior CABG. Heart and mediastinal contours are within normal limits. Scarring in the lingula. Right lung clear. No effusions. Heart is normal size. No acute bony abnormality. IMPRESSION: No active disease. Electronically Signed   By: Rolm Baptise M.D.   On: 04/26/2021 22:35    ____________________________________________   PROCEDURES .Critical Care  Date/Time: 04/26/2021 11:29 PM Performed by: Carrie Mew, MD Authorized by: Carrie Mew, MD   Critical care provider statement:    Critical care time (minutes):  35   Critical care time was exclusive of:  Separately billable procedures and treating other patients   Critical care was necessary to  treat or prevent imminent or life-threatening deterioration of the following conditions:  Cardiac failure   Critical care was time spent personally by me on the following activities:  Development of treatment plan with patient or surrogate, discussions with consultants, evaluation of patient's response to treatment, examination of patient, obtaining history from patient or surrogate, ordering and performing treatments and interventions, ordering and review of laboratory studies, ordering and review of radiographic studies, pulse oximetry, re-evaluation of patient's condition and review of old charts  ____________________________________________  DIFFERENTIAL DIAGNOSIS   Non-STEMI/unstable angina, pneumothorax, pulmonary edema, GERD  CLINICAL IMPRESSION / ASSESSMENT AND PLAN / ED COURSE  Medications ordered in the ED: Medications  nitroGLYCERIN 50 mg in dextrose 5 % 250 mL (0.2 mg/mL) infusion (30 mcg/min Intravenous Rate/Dose Change 04/26/21 2248)  heparin ADULT infusion 100 units/mL (25000 units/232m) (1,050 Units/hr Intravenous New Bag/Given 04/26/21 2249)    Pertinent labs & imaging results that were available during my care of the patient were reviewed by  me and considered in my medical decision making (see chart for details).  KAHIAU SCHEWE was evaluated in Emergency Department on 04/26/2021 for the symptoms described in the history of present illness. He was evaluated in the context of the global COVID-19 pandemic, which necessitated consideration that the patient might be at risk for infection with the SARS-CoV-2 virus that causes COVID-19. Institutional protocols and algorithms that pertain to the evaluation of patients at risk for COVID-19 are in a state of rapid change based on information released by regulatory bodies including the CDC and federal and state organizations. These policies and algorithms were followed during the patient's care in the ED.   Patient presents with chest  pain worrisome for ACS.  EKG not consistent with STEMI.  Doubt PE dissection pericarditis.  Blood pressure markedly elevated at 190/100 on arrival.  We will start heparin infusion, nitroglycerin titration and plan to admit.  ----------------------------------------- 11:27 PM on 04/26/2021 ----------------------------------------- Chest ray unremarkable.  Initial labs unremarkable with troponin of 19.  COVID and flu negative.      ____________________________________________   FINAL CLINICAL IMPRESSION(S) / ED DIAGNOSES    Final diagnoses:  Unstable angina (HCC)  Atrial fibrillation, unspecified type (HCC)  Chronic diastolic heart failure (HCC)  Chronic obstructive pulmonary disease, unspecified COPD type [facility])     ED Discharge Orders     None       Portions of this note were generated with dragon dictation software. Dictation errors may occur despite best attempts at proofreading.   Carrie Mew, MD 04/26/21 2329

## 2021-04-27 ENCOUNTER — Observation Stay
Admit: 2021-04-27 | Discharge: 2021-04-27 | Disposition: A | Discharge status: Home / Self Care | Payer: Medicare HMO | Attending: Internal Medicine | Admitting: Internal Medicine

## 2021-04-27 ENCOUNTER — Encounter: Payer: Self-pay | Admitting: Internal Medicine

## 2021-04-27 DIAGNOSIS — E1121 Type 2 diabetes mellitus with diabetic nephropathy: Secondary | ICD-10-CM | POA: Diagnosis not present

## 2021-04-27 DIAGNOSIS — I25118 Atherosclerotic heart disease of native coronary artery with other forms of angina pectoris: Secondary | ICD-10-CM | POA: Diagnosis not present

## 2021-04-27 DIAGNOSIS — J449 Chronic obstructive pulmonary disease, unspecified: Secondary | ICD-10-CM | POA: Diagnosis not present

## 2021-04-27 DIAGNOSIS — I5022 Chronic systolic (congestive) heart failure: Secondary | ICD-10-CM | POA: Diagnosis not present

## 2021-04-27 DIAGNOSIS — I160 Hypertensive urgency: Secondary | ICD-10-CM | POA: Diagnosis present

## 2021-04-27 LAB — CBC WITH DIFFERENTIAL/PLATELET
Abs Immature Granulocytes: 0.04 10*3/uL (ref 0.00–0.07)
Basophils Absolute: 0.1 10*3/uL (ref 0.0–0.1)
Basophils Relative: 1 %
Eosinophils Absolute: 0.3 10*3/uL (ref 0.0–0.5)
Eosinophils Relative: 5 %
HCT: 48.7 % (ref 39.0–52.0)
Hemoglobin: 17 g/dL (ref 13.0–17.0)
Immature Granulocytes: 1 %
Lymphocytes Relative: 35 %
Lymphs Abs: 2.6 10*3/uL (ref 0.7–4.0)
MCH: 32.6 pg (ref 26.0–34.0)
MCHC: 34.9 g/dL (ref 30.0–36.0)
MCV: 93.5 fL (ref 80.0–100.0)
Monocytes Absolute: 0.5 10*3/uL (ref 0.1–1.0)
Monocytes Relative: 7 %
Neutro Abs: 3.9 10*3/uL (ref 1.7–7.7)
Neutrophils Relative %: 51 %
Platelets: 174 10*3/uL (ref 150–400)
RBC: 5.21 MIL/uL (ref 4.22–5.81)
RDW: 13.8 % (ref 11.5–15.5)
WBC: 7.4 10*3/uL (ref 4.0–10.5)
nRBC: 0 % (ref 0.0–0.2)

## 2021-04-27 LAB — MAGNESIUM
Magnesium: 2.2 mg/dL (ref 1.7–2.4)
Magnesium: 2 mg/dL (ref 1.7–2.4)

## 2021-04-27 LAB — COMPREHENSIVE METABOLIC PANEL
ALT: 12 U/L (ref 0–44)
AST: 16 U/L (ref 15–41)
Albumin: 3.6 g/dL (ref 3.5–5.0)
Alkaline Phosphatase: 63 U/L (ref 38–126)
Anion gap: 5 (ref 5–15)
BUN: 10 mg/dL (ref 8–23)
CO2: 30 mmol/L (ref 22–32)
Calcium: 8.8 mg/dL — ABNORMAL LOW (ref 8.9–10.3)
Chloride: 100 mmol/L (ref 98–111)
Creatinine, Ser: 0.75 mg/dL (ref 0.61–1.24)
GFR, Estimated: >60 mL/min (ref >60)
Glucose, Bld: 214 mg/dL — ABNORMAL HIGH (ref 70–99)
Potassium: 2.8 mmol/L — ABNORMAL LOW (ref 3.5–5.1)
Sodium: 135 mmol/L (ref 135–145)
Total Bilirubin: 1.1 mg/dL (ref 0.3–1.2)
Total Protein: 6 g/dL — ABNORMAL LOW (ref 6.5–8.1)

## 2021-04-27 LAB — LIPID PANEL
Cholesterol: 146 mg/dL (ref 0–200)
HDL: 32 mg/dL — ABNORMAL LOW (ref >40)
LDL Cholesterol: 35 mg/dL (ref 0–99)
Total CHOL/HDL Ratio: 4.6 RATIO
Triglycerides: 397 mg/dL — ABNORMAL HIGH (ref <150)
VLDL: 79 mg/dL — ABNORMAL HIGH (ref 0–40)

## 2021-04-27 LAB — CBG MONITORING, ED
Glucose-Capillary: 226 mg/dL — ABNORMAL HIGH (ref 70–99)
Glucose-Capillary: 162 mg/dL — ABNORMAL HIGH (ref 70–99)

## 2021-04-27 LAB — URINALYSIS, COMPLETE (UACMP) WITH MICROSCOPIC
Bacteria, UA: NONE SEEN
Bilirubin Urine: NEGATIVE
Glucose, UA: >=500 mg/dL — AB
Hgb urine dipstick: NEGATIVE
Ketones, ur: NEGATIVE mg/dL
Leukocytes,Ua: NEGATIVE
Nitrite: NEGATIVE
Protein, ur: NEGATIVE mg/dL
Specific Gravity, Urine: 1.006 (ref 1.005–1.030)
Squamous Epithelial / HPF: NONE SEEN (ref 0–5)
pH: 6 (ref 5.0–8.0)

## 2021-04-27 LAB — PROTIME-INR
INR: 1.1 (ref 0.8–1.2)
Prothrombin Time: 14.4 seconds (ref 11.4–15.2)

## 2021-04-27 LAB — APTT
aPTT: 34 seconds (ref 24–36)
aPTT: 58 seconds — ABNORMAL HIGH (ref 24–36)

## 2021-04-27 LAB — GLUCOSE, CAPILLARY
Glucose-Capillary: 198 mg/dL — ABNORMAL HIGH (ref 70–99)
Glucose-Capillary: 209 mg/dL — ABNORMAL HIGH (ref 70–99)
Glucose-Capillary: 162 mg/dL — ABNORMAL HIGH (ref 70–99)
Glucose-Capillary: 171 mg/dL — ABNORMAL HIGH (ref 70–99)

## 2021-04-27 LAB — BRAIN NATRIURETIC PEPTIDE: B Natriuretic Peptide: 53.4 pg/mL (ref 0.0–100.0)

## 2021-04-27 LAB — TSH: TSH: 2.065 u[IU]/mL (ref 0.350–4.500)

## 2021-04-27 LAB — HEPARIN LEVEL (UNFRACTIONATED)
Heparin Unfractionated: 1.04 IU/mL — ABNORMAL HIGH (ref 0.30–0.70)
Heparin Unfractionated: 0.75 IU/mL — ABNORMAL HIGH (ref 0.30–0.70)

## 2021-04-27 LAB — PHOSPHORUS: Phosphorus: 4.5 mg/dL (ref 2.5–4.6)

## 2021-04-27 LAB — TROPONIN I (HIGH SENSITIVITY)
Troponin I (High Sensitivity): 14 ng/L (ref <18)
Troponin I (High Sensitivity): 18 ng/L — ABNORMAL HIGH (ref <18)
Troponin I (High Sensitivity): 16 ng/L (ref <18)

## 2021-04-27 MED ORDER — ATORVASTATIN CALCIUM 80 MG PO TABS
80.0000 mg | ORAL_TABLET | Freq: Every day | ORAL | Status: DC
  Administered 2021-04-27 – 2021-05-01 (×6): 80 mg via ORAL
  Filled 2021-04-27 (×5): qty 1
  Filled 2021-04-27: qty 4

## 2021-04-27 MED ORDER — SODIUM CHLORIDE 0.9% FLUSH
3.0000 mL | Freq: Two times a day (BID) | INTRAVENOUS | Status: DC
  Administered 2021-04-27 – 2021-04-30 (×8): 3 mL via INTRAVENOUS

## 2021-04-27 MED ORDER — NITROGLYCERIN IN D5W 200-5 MCG/ML-% IV SOLN: 0.0000 ug/min | INTRAVENOUS | Status: DC

## 2021-04-27 MED ORDER — ONDANSETRON HCL 4 MG/2ML IJ SOLN
4.0000 mg | Freq: Once | INTRAMUSCULAR | Status: AC
  Administered 2021-04-27: 4 mg via INTRAVENOUS
  Filled 2021-04-27: qty 2

## 2021-04-27 MED ORDER — HYDROMORPHONE HCL 1 MG/ML IJ SOLN
0.5000 mg | INTRAMUSCULAR | Status: DC | PRN
  Administered 2021-04-27 – 2021-05-01 (×16): 0.5 mg via INTRAVENOUS
  Filled 2021-04-27 (×16): qty 1

## 2021-04-27 MED ORDER — FLUTICASONE FUROATE-VILANTEROL 100-25 MCG/INH IN AEPB
1.0000 | INHALATION_SPRAY | Freq: Every day | RESPIRATORY_TRACT | Status: DC
  Administered 2021-04-27 – 2021-04-30 (×3): 1 via RESPIRATORY_TRACT
  Filled 2021-04-27 (×2): qty 28

## 2021-04-27 MED ORDER — AMLODIPINE BESYLATE 5 MG PO TABS
2.5000 mg | ORAL_TABLET | Freq: Every day | ORAL | Status: DC
  Administered 2021-04-27 – 2021-05-02 (×5): 2.5 mg via ORAL
  Filled 2021-04-27 (×5): qty 1

## 2021-04-27 MED ORDER — ACETAMINOPHEN 325 MG PO TABS: 650.0000 mg | ORAL_TABLET | Freq: Four times a day (QID) | ORAL | Status: DC | PRN

## 2021-04-27 MED ORDER — ACETAMINOPHEN 650 MG RE SUPP: 650.0000 mg | Freq: Four times a day (QID) | RECTAL | Status: DC | PRN

## 2021-04-27 MED ORDER — LISINOPRIL 10 MG PO TABS
10.0000 mg | ORAL_TABLET | Freq: Every day | ORAL | Status: DC
  Administered 2021-04-27 – 2021-05-02 (×5): 10 mg via ORAL
  Filled 2021-04-27 (×5): qty 1

## 2021-04-27 MED ORDER — RANOLAZINE ER 500 MG PO TB12: 1000.0000 mg | ORAL_TABLET | Freq: Two times a day (BID) | ORAL | Status: DC

## 2021-04-27 MED ORDER — HEPARIN BOLUS VIA INFUSION
1200.0000 [IU] | Freq: Once | INTRAVENOUS | Status: AC
  Administered 2021-04-27: 1200 [IU] via INTRAVENOUS
  Filled 2021-04-27: qty 1200

## 2021-04-27 MED ORDER — ONDANSETRON HCL 4 MG/2ML IJ SOLN
4.0000 mg | Freq: Three times a day (TID) | INTRAMUSCULAR | Status: DC | PRN
  Administered 2021-04-29: 4 mg via INTRAVENOUS
  Filled 2021-04-27: qty 2

## 2021-04-27 MED ORDER — TRAZODONE HCL 50 MG PO TABS
150.0000 mg | ORAL_TABLET | Freq: Every day | ORAL | Status: DC
  Administered 2021-04-27 – 2021-05-01 (×6): 150 mg via ORAL
  Filled 2021-04-27 (×6): qty 1

## 2021-04-27 MED ORDER — APIXABAN 5 MG PO TABS
5.0000 mg | ORAL_TABLET | Freq: Two times a day (BID) | ORAL | Status: DC
  Administered 2021-04-27 – 2021-04-29 (×6): 5 mg via ORAL
  Filled 2021-04-27 (×6): qty 1

## 2021-04-27 MED ORDER — BUDESONIDE 0.5 MG/2ML IN SUSP: 0.5000 mg | Freq: Two times a day (BID) | RESPIRATORY_TRACT | Status: DC | PRN

## 2021-04-27 MED ORDER — ALPRAZOLAM 0.5 MG PO TABS
1.0000 mg | ORAL_TABLET | Freq: Three times a day (TID) | ORAL | Status: DC | PRN
  Administered 2021-04-27 – 2021-05-02 (×7): 1 mg via ORAL
  Filled 2021-04-27 (×7): qty 2

## 2021-04-27 MED ORDER — LORATADINE 10 MG PO TABS
10.0000 mg | ORAL_TABLET | Freq: Every day | ORAL | Status: DC
  Administered 2021-04-27 – 2021-05-02 (×6): 10 mg via ORAL
  Filled 2021-04-27 (×6): qty 1

## 2021-04-27 MED ORDER — ASPIRIN 81 MG PO CHEW
81.0000 mg | CHEWABLE_TABLET | Freq: Every day | ORAL | Status: DC
  Administered 2021-04-27 – 2021-05-02 (×6): 81 mg via ORAL
  Filled 2021-04-27 (×6): qty 1

## 2021-04-27 MED ORDER — PANTOPRAZOLE SODIUM 40 MG PO TBEC
40.0000 mg | DELAYED_RELEASE_TABLET | Freq: Every day | ORAL | Status: DC
  Administered 2021-04-27 – 2021-05-02 (×5): 40 mg via ORAL
  Filled 2021-04-27 (×5): qty 1

## 2021-04-27 MED ORDER — SODIUM CHLORIDE 0.9% FLUSH: 3.0000 mL | INTRAVENOUS | Status: DC | PRN

## 2021-04-27 MED ORDER — INSULIN ASPART 100 UNIT/ML IJ SOLN
0.0000 [IU] | INTRAMUSCULAR | Status: DC
  Administered 2021-04-27: 3 [IU] via SUBCUTANEOUS
  Administered 2021-04-27: 2 [IU] via SUBCUTANEOUS
  Administered 2021-04-27: 3 [IU] via SUBCUTANEOUS
  Administered 2021-04-27 – 2021-04-29 (×10): 2 [IU] via SUBCUTANEOUS
  Administered 2021-04-29: 3 [IU] via SUBCUTANEOUS
  Administered 2021-04-29: 5 [IU] via SUBCUTANEOUS
  Administered 2021-04-29 – 2021-04-30 (×4): 2 [IU] via SUBCUTANEOUS
  Administered 2021-04-30: 1 [IU] via SUBCUTANEOUS
  Administered 2021-04-30 (×3): 2 [IU] via SUBCUTANEOUS
  Administered 2021-05-01: 3 [IU] via SUBCUTANEOUS
  Administered 2021-05-01 (×3): 2 [IU] via SUBCUTANEOUS
  Administered 2021-05-02: 3 [IU] via SUBCUTANEOUS
  Administered 2021-05-02: 1 [IU] via SUBCUTANEOUS
  Administered 2021-05-02: 2 [IU] via SUBCUTANEOUS
  Filled 2021-04-27 (×29): qty 1

## 2021-04-27 MED ORDER — PERFLUTREN LIPID MICROSPHERE
1.0000 mL | INTRAVENOUS | Status: AC | PRN
  Administered 2021-04-27: 5.5 mL via INTRAVENOUS
  Filled 2021-04-27: qty 10

## 2021-04-27 MED ORDER — ISOSORBIDE MONONITRATE ER 60 MG PO TB24
120.0000 mg | ORAL_TABLET | Freq: Every day | ORAL | Status: DC
  Administered 2021-04-27 – 2021-05-02 (×5): 120 mg via ORAL
  Filled 2021-04-27 (×5): qty 2

## 2021-04-27 MED ORDER — SOTALOL HCL 80 MG PO TABS: 40.0000 mg | ORAL_TABLET | Freq: Two times a day (BID) | ORAL | Status: DC

## 2021-04-27 MED ORDER — SOTALOL HCL 80 MG PO TABS
40.0000 mg | ORAL_TABLET | Freq: Two times a day (BID) | ORAL | Status: DC
  Administered 2021-04-27 – 2021-05-02 (×8): 40 mg via ORAL
  Filled 2021-04-27 (×13): qty 0.5

## 2021-04-27 MED ORDER — HYDROCODONE-ACETAMINOPHEN 5-325 MG PO TABS
1.0000 | ORAL_TABLET | ORAL | Status: DC | PRN
  Administered 2021-04-27 (×2): 2 via ORAL
  Administered 2021-04-27: 1 via ORAL
  Administered 2021-04-27: 2 via ORAL
  Administered 2021-04-28: 1 via ORAL
  Administered 2021-04-28 – 2021-04-29 (×4): 2 via ORAL
  Administered 2021-04-29: 1 via ORAL
  Administered 2021-04-29 – 2021-05-01 (×4): 2 via ORAL
  Administered 2021-05-02: 1 via ORAL
  Filled 2021-04-27 (×2): qty 2
  Filled 2021-04-27: qty 1
  Filled 2021-04-27 (×3): qty 2
  Filled 2021-04-27: qty 1
  Filled 2021-04-27: qty 2
  Filled 2021-04-27: qty 1
  Filled 2021-04-27 (×6): qty 2

## 2021-04-27 MED ORDER — RANOLAZINE ER 500 MG PO TB12
1000.0000 mg | ORAL_TABLET | Freq: Two times a day (BID) | ORAL | Status: DC
  Administered 2021-04-27 – 2021-05-02 (×10): 1000 mg via ORAL
  Filled 2021-04-27 (×12): qty 2

## 2021-04-27 MED ORDER — SODIUM CHLORIDE 0.9 % IV SOLN: 250.0000 mL | INTRAVENOUS | Status: DC | PRN

## 2021-04-27 MED ORDER — POTASSIUM CHLORIDE CRYS ER 20 MEQ PO TBCR
40.0000 meq | EXTENDED_RELEASE_TABLET | Freq: Two times a day (BID) | ORAL | Status: AC
  Administered 2021-04-27 (×2): 40 meq via ORAL
  Filled 2021-04-27 (×2): qty 2

## 2021-04-27 MED ORDER — TORSEMIDE 20 MG PO TABS
50.0000 mg | ORAL_TABLET | Freq: Every day | ORAL | Status: DC
  Administered 2021-04-27 – 2021-05-02 (×5): 50 mg via ORAL
  Filled 2021-04-27 (×4): qty 3
  Filled 2021-04-27: qty 1
  Filled 2021-04-27: qty 3

## 2021-04-27 NOTE — ED Notes (Signed)
Lab called by this RN to come collect morning labs.

## 2021-04-27 NOTE — Progress Notes (Signed)
[facility] PROGRESS NOTE    KALOB BERGEN  MWN:[id number] DOB: May 13, 1956 DOA: 04/26/2021 PCP: Birdie Sons, MD      Brief Narrative:  Mr. Hosking is a 65 y.o. M with emphysema, COPD on chronic oxygen, A. fib on Eliquis, CAD s/p CABG with chronic angina, HTN, MD CHF who presented with chest pain.  Evidently patient had several hours of no chest pain and nausea radiating to his left arm, typical of his previous angina which did not improve with nitroglycerin so he came to the ER.  In the ER, ECG unremarkable, troponins negative, his chest pain was ongoing so he was started on nitroglycerin and heparin and admitted by hospital service.       Assessment & Plan:  Chest pain Cardiology evaluated patient this morning and feel this is now unstable angina, heparin has been stopped.  They recommended restarting his home antianginals which we have done.  Through the day today, his pain has not been well controlled on his home antianginals - Continue amlodipine, Imdur, Ranexa - Continue aspirin, statin  Hypertension - Continue lisinopril, amlodipine, Imdur  Atrial fibrillation - Continue sotalol, apixaban  COPD - Continue ICS/LAMA - Continue loratadine, PPI  Chronic diastolic CHF Appears euvolemic - Continue torsemide, potassium  Insomnia - Continue trazodone  Hypokalemia Magnesium is replete - Supplement potassium   Gout - Hold allopurinol  Mood disorder - Hold venlafaxine nightly.  Diabetes - Hold Farxiga, metformin - Continue sliding scale correction       Disposition: Status is: Observation    Dispo: The patient is from: Home              Anticipated d/c is to: Home              Patient currently is not medically stable to d/c.   Difficult to place patient No       Level of care: Progressive Cardiac       MDM: The below labs and imaging reports were reviewed and summarized above.  Medication management as above.   Is requiring frequent dosing of IV Dilaudid    DVT prophylaxis:  apixaban (ELIQUIS) tablet 5 mg  Code Status: Full code Family Communication:         Subjective: Still with chest pain, worse with exertion.  Still some nausea.  Still fatigued.  No confusion, cough, fever.  No urinary irritative symptoms.  Objective: Vitals:   04/27/21 0830 04/27/21 0900 04/27/21 1142 04/27/21 1535  BP: 115/81 108/85 135/89 105/81  Pulse: (!) 25 75 84 70  Resp: 11 11 16 16   Temp:   97.9 F (36.6 C) 97.9 F (36.6 C)  TempSrc:    Oral  SpO2: 91% 94% 94% 90%  Weight:      Height:        Intake/Output Summary (Last 24 hours) at 04/27/2021 1711 Last data filed at 04/27/2021 1052 Gross per 24 hour  Intake 364.76 ml  Output 1650 ml  Net -1285.24 ml   Filed Weights   04/26/21 2214  Weight: 99.8 kg    Examination: General appearance:  adult male, alert and in mild distress due to pain.   HEENT: Anicteric, conjunctiva pink, lids and lashes normal. No nasal deformity, discharge, epistaxis.   Skin: Warm and dry.  No jaundice.  No suspicious rashes or lesions. Cardiac: RRR, nl S1-S2, no murmurs appreciated.  Capillary refill is brisk.  JVP normal.  No LE edema.  Radial  pulses 2+ and  symmetric. Respiratory: Normal respiratory rate and rhythm.  CTAB without rales or wheezes. Abdomen: Abdomen soft.  No TTP or guarding. No ascites, distension, hepatosplenomegaly.   MSK: No deformities or effusions. Neuro: Awake and alert.  EOMI, moves all extremities. Speech fluent.    Psych: Sensorium intact and responding to questions, attention normal. Affect normal.  Judgment and insight appear normal.    Data Reviewed: I have personally reviewed following labs and imaging studies:  CBC: Recent Labs  Lab 04/26/21 2213 04/27/21 0326  WBC 7.6 7.4  NEUTROABS 4.1 3.9  HGB 17.0 17.0  HCT 51.2 48.7  MCV 94.5 93.5  PLT 180 935   Basic Metabolic Panel: Recent Labs  Lab 04/26/21 2213 04/27/21 0326   NA 140 135  K 3.6 2.8*  CL 102 100  CO2 32 30  GLUCOSE 132* 214*  BUN 9 10  CREATININE 0.69 0.75  CALCIUM 9.2 8.8*  MG 2.2 2.0  PHOS  --  4.5   GFR: Estimated Creatinine Clearance: 100.9 mL/min (by C-G formula based on SCr of 0.75 mg/dL). Liver Function Tests: Recent Labs  Lab 04/26/21 2213 04/27/21 0326  AST 14* 16  ALT 14 12  ALKPHOS 70 63  BILITOT 1.1 1.1  PROT 6.9 6.0*  ALBUMIN 4.0 3.6   Recent Labs  Lab 04/26/21 2213  LIPASE 30   No results for input(s): AMMONIA in the last 168 hours. Coagulation Profile: Recent Labs  Lab 04/26/21 2213  INR 1.1   Cardiac Enzymes: No results for input(s): CKTOTAL, CKMB, CKMBINDEX, TROPONINI in the last 168 hours. BNP (last 3 results) No results for input(s): PROBNP in the last 8760 hours. HbA1C: No results for input(s): HGBA1C in the last 72 hours. CBG: Recent Labs  Lab 04/27/21 0404 04/27/21 0905 04/27/21 1138 04/27/21 1611  GLUCAP 226* 162* 198* 209*   Lipid Profile: Recent Labs    04/27/21 0326  CHOL 146  HDL 32*  LDLCALC 35  TRIG 397*  CHOLHDL 4.6   Thyroid Function Tests: Recent Labs    04/27/21 0326  TSH 2.065   Anemia Panel: No results for input(s): VITAMINB12, FOLATE, FERRITIN, TIBC, IRON, RETICCTPCT in the last 72 hours. Urine analysis:    Component Value Date/Time   COLORURINE STRAW (A) 04/26/2021 2213   APPEARANCEUR CLEAR (A) 04/26/2021 2213   APPEARANCEUR Clear 05/25/2019 1554   LABSPEC 1.006 04/26/2021 2213   LABSPEC 1.012 03/07/2015 2143   PHURINE 6.0 04/26/2021 2213   GLUCOSEU >=500 (A) 04/26/2021 2213   GLUCOSEU Negative 03/07/2015 2143   HGBUR NEGATIVE 04/26/2021 2213   BILIRUBINUR NEGATIVE 04/26/2021 2213   BILIRUBINUR Negative 05/25/2019 1554   BILIRUBINUR Negative 03/07/2015 2143   KETONESUR NEGATIVE 04/26/2021 2213   PROTEINUR NEGATIVE 04/26/2021 2213   UROBILINOGEN 0.2 03/26/2017 0907   NITRITE NEGATIVE 04/26/2021 2213   LEUKOCYTESUR NEGATIVE 04/26/2021 2213    LEUKOCYTESUR Negative 03/07/2015 2143   Sepsis Labs: @LABRCNTIP (procalcitonin:4,lacticacidven:4)  ) Recent Results (from the past 240 hour(s))  Resp Panel by RT-PCR (Flu A&B, Covid) Nasopharyngeal Swab     Status: None   Collection Time: 04/26/21 10:13 PM   Specimen: Nasopharyngeal Swab; Nasopharyngeal(NP) swabs in vial transport medium  Result Value Ref Range Status   SARS Coronavirus 2 by RT PCR NEGATIVE NEGATIVE Final    Comment: (NOTE) SARS-CoV-2 target nucleic acids are NOT DETECTED.  The SARS-CoV-2 RNA is generally detectable in upper respiratory specimens during the acute phase of infection. The lowest concentration of SARS-CoV-2 viral copies this assay can detect is  138 copies/mL. A negative result does not preclude SARS-Cov-2 infection and should not be used as the sole basis for treatment or other patient management decisions. A negative result may occur with  improper specimen collection/handling, submission of specimen other than nasopharyngeal swab, presence of viral mutation(s) within the areas targeted by this assay, and inadequate number of viral copies(<138 copies/mL). A negative result must be combined with clinical observations, patient history, and epidemiological information. The expected result is Negative.  Fact Sheet for Patients:  EntrepreneurPulse.com.au  Fact Sheet for Healthcare Providers:  IncredibleEmployment.be  This test is no t yet approved or cleared by the [place] FDA and  has been authorized for detection and/or diagnosis of SARS-CoV-2 by FDA under an Emergency Use Authorization (EUA). This EUA will remain  in effect (meaning this test can be used) for the duration of the COVID-19 declaration under Section 564(b)(1) of the Act, 21 U.S.C.section 360bbb-3(b)(1), unless the authorization is terminated  or revoked sooner.       Influenza A by PCR NEGATIVE NEGATIVE Final   Influenza B by PCR NEGATIVE  NEGATIVE Final    Comment: (NOTE) The Xpert Xpress SARS-CoV-2/FLU/RSV plus assay is intended as an aid in the diagnosis of influenza from Nasopharyngeal swab specimens and should not be used as a sole basis for treatment. Nasal washings and aspirates are unacceptable for Xpert Xpress SARS-CoV-2/FLU/RSV testing.  Fact Sheet for Patients: EntrepreneurPulse.com.au  Fact Sheet for Healthcare Providers: IncredibleEmployment.be  This test is not yet approved or cleared by the [place] FDA and has been authorized for detection and/or diagnosis of SARS-CoV-2 by FDA under an Emergency Use Authorization (EUA). This EUA will remain in effect (meaning this test can be used) for the duration of the COVID-19 declaration under Section 564(b)(1) of the Act, 21 U.S.C. section 360bbb-3(b)(1), unless the authorization is terminated or revoked.  Performed at [facility], [address] [place], [place] [address]          Radiology Studies: DG Chest Portable 1 View  Result Date: 04/26/2021 CLINICAL DATA:  Chest pain EXAM: PORTABLE CHEST 1 VIEW COMPARISON:  12/10/2020 FINDINGS: Prior CABG. Heart and mediastinal contours are within normal limits. Scarring in the lingula. Right lung clear. No effusions. Heart is normal size. No acute bony abnormality. IMPRESSION: No active disease. Electronically Signed   By: Rolm Baptise M.D.   On: 04/26/2021 22:35        Scheduled Meds:  amLODipine  2.5 mg Oral Daily   apixaban  5 mg Oral BID   aspirin  81 mg Oral Daily   atorvastatin  80 mg Oral QHS   fluticasone furoate-vilanterol  1 puff Inhalation Daily   insulin aspart  0-9 Units Subcutaneous Q4H   isosorbide mononitrate  120 mg Oral Daily   lisinopril  10 mg Oral Daily   loratadine  10 mg Oral Daily   pantoprazole  40 mg Oral Q1200   potassium chloride  40 mEq Oral BID   ranolazine  1,000 mg Oral BID   sodium chloride flush  3 mL Intravenous  Q12H   sotalol  40 mg Oral BID   torsemide  50 mg Oral Daily   traZODone  150 mg Oral QHS   Continuous Infusions:  sodium chloride       LOS: 0 days    Time spent: 25 minutes    Edwin Dada, MD [facility] 04/27/2021, 5:11 PM     Please page though AMION or Epic secure chat:  For [employer], Adult nurse

## 2021-04-27 NOTE — ED Notes (Signed)
AAOx3.  Skin warm and dry.  Eating breakfast.   No SOB/ DOE.  States pain remains 8/10.  Patient in NAD

## 2021-04-27 NOTE — ED Notes (Signed)
Ambulated pt in hallway per Dr Loleta Books to assess CP- pt states before ambulating he had pain and it was worse with ambulation, pt had to stop on the way back to room for a break- chat sent to Dr Loleta Books with update

## 2021-04-27 NOTE — ED Notes (Signed)
Echo at bedside.  Patient AAOx3.  Skin warm and dry. NAD

## 2021-04-27 NOTE — ED Notes (Signed)
Provided with crackers, applesauce, po fluids

## 2021-04-27 NOTE — Progress Notes (Signed)
*  PRELIMINARY RESULTS* Echocardiogram 2D Echocardiogram has been performed. Definity IV Contrast used on this study.  Claretta Fraise 04/27/2021, 9:05 AM

## 2021-04-27 NOTE — Consult Note (Signed)
[facility] Consultation Note  Patient ID: Lawrence [name], MRN: [id number], DOB/AGE: 1956/05/14 65 y.o. Admit date: 04/26/2021   Date of Consult: 04/27/2021 Primary Physician: Birdie Sons, MD Primary Cardiologist: Fath  Chief Complaint: No chief complaint on file.  Reason for Consult:  Chest pain  HPI: 65 y.o. male with known chronic obstructive pulmonary disease with COPD and previous tobacco abuse hypertension hyperlipidemia previous myocardial infarction and coronary artery bypass graft with multiple stents most recently having cardiac catheterization last year showing widely patent stent of the circumflex artery and left anterior descending artery and other diffuse coronary artery disease of which the largest abnormality being and obtuse marginal artery stenosis.  The patient has been on maximize medical management for his typical angina for which occurs with any physical activity relieved by rest.  He also does take some nitroglycerin sublingually as necessary but is maximized on beta-blocker calcium channel blocker isosorbide and Ranexa.  This has been working relatively well until he had an episode of chest discomfort that did not relieve well.  He says it relatively typical to his normal chest pain and took 4 nitroglycerin and aspirin with no particular relief immediately.  When seen in the emergency room he had an EKG showing normal sinus rhythm and no acute changes.  Troponin has been 19/14/18 without evidence of acute coronary syndrome.  Currently he is hemodynamically stable and free of chest discomfort.  He has had a previous echocardiogram showing mild apical hypokinesis with ejection fraction of 45% but otherwise no evidence of significant valvular heart disease.  Currently he is hemodynamically stable  Past Medical History:  Diagnosis Date   A-fib ([place])    Anemia    Anginal pain (HCC)    Anxiety    Arthritis    Asthma    CAD (coronary artery disease)    a.  2002 CABGx2 (LIMA->LAD, VG->VG->OM1);  b. 09/2012 DES->OM;  c. 03/2015 PTCA of LAD [facility]) in setting of atretic LIMA; d. 05/2015 Cath [facility]): nonobs dzs; e. 06/2015 Cath ([facility]): LM nl, LAD 45p/d ISR, 50d, D1/2 small, LCX 50p/d ISR, OM1 70ost, 30 ISR, VG->OM1 50ost, 109m LIMA->LAD 99p/d - atretic, RCA dom, nl; f.cath 10/16: 40-50%(FFR 0.90) pLAD, 75% (FFR 0.77) mLAD s/p PCI/DES, oRCA 40% (FFR0.95)   Cancer (HCC)    SKIN CANCER ON BACK   Celiac disease    Chronic diastolic CHF (congestive heart failure) ([place]    a. 06/2009 Echo: EF 60-65%, Gr 1 DD, triv AI, mildly dil LA, nl RV.   COPD (chronic obstructive pulmonary disease) ([place]    a. Chronic bronchitis and emphysema.   DDD (degenerative disc disease), lumbar    Diverticulosis    Dysrhythmia    Essential hypertension    GERD (gastroesophageal reflux disease)    History of hiatal hernia    History of kidney stones    H/O   History of tobacco abuse    a. Quit 2014.   Myocardial infarction ([facility] 2002   4 STENTS   Pancreatitis    PSVT (paroxysmal supraventricular tachycardia) ([place]    a. 10/2012 Noted on Zio Patch.   Sleep apnea    LOST CORD TO CPAP -ONLY 02 @ BEDTIME   Tubular adenoma of colon    Type II diabetes mellitus ([place]       Surgical History:  Past Surgical History:  Procedure Laterality Date   BYPASS GRAFT     CARDIAC CATHETERIZATION N/A 07/12/2015   rocedure: Left Heart Cath and  Cors/Grafts Angiography;  Surgeon: Belva Crome, MD;  Location: [place] CV LAB;  Service: Cardiovascular;  Laterality: N/A;   CARDIAC CATHETERIZATION Right 10/07/2015   Procedure: Left Heart Cath and Cors/Grafts Angiography;  Surgeon: Dionisio David, MD;  Location: [place] CV LAB;  Service: Cardiovascular;  Laterality: Right;   CARDIAC CATHETERIZATION N/A 04/06/2016   Procedure: Left Heart Cath and Coronary Angiography;  Surgeon: Yolonda Kida, MD;  Location: [place] CV LAB;  Service: Cardiovascular;  Laterality: N/A;   CARDIAC  CATHETERIZATION  04/06/2016   Procedure: Bypass Graft Angiography;  Surgeon: Yolonda Kida, MD;  Location: [place] [facility] LAB;  Service: Cardiovascular;;   CARDIAC CATHETERIZATION N/A 11/02/2016   Procedure: Left Heart Cath and Cors/Grafts Angiography and possible PCI;  Surgeon: Yolonda Kida, MD;  Location: [place] CV LAB;  Service: Cardiovascular;  Laterality: N/A;   CARDIAC CATHETERIZATION N/A 11/02/2016   Procedure: Coronary Stent Intervention;  Surgeon: Yolonda Kida, MD;  Location: [place] CV LAB;  Service: Cardiovascular;  Laterality: N/A;   CHOLECYSTECTOMY     CIRCUMCISION N/A 06/09/2019   Procedure: CIRCUMCISION ADULT;  Surgeon: Billey Co, MD;  Location: [facility] ORS;  Service: Urology;  Laterality: N/A;   COLONOSCOPY WITH PROPOFOL N/A 04/01/2018   Procedure: COLONOSCOPY WITH PROPOFOL;  Surgeon: Manya Silvas, MD;  Location: [facility] ENDOSCOPY;  Service: Endoscopy;  Laterality: N/A;   ESOPHAGEAL DILATION     ESOPHAGOGASTRODUODENOSCOPY (EGD) WITH PROPOFOL N/A 04/01/2018   Procedure: ESOPHAGOGASTRODUODENOSCOPY (EGD) WITH PROPOFOL;  Surgeon: Manya Silvas, MD;  Location: [facility] ENDOSCOPY;  Service: Endoscopy;  Laterality: N/A;   LEFT HEART CATH AND CORS/GRAFTS ANGIOGRAPHY N/A 06/12/2019   Procedure: LEFT HEART CATH AND CORS/GRAFTS ANGIOGRAPHY;  Surgeon: Teodoro Spray, MD;  Location: [place] CV LAB;  Service: Cardiovascular;  Laterality: N/A;   LEFT HEART CATH AND CORS/GRAFTS ANGIOGRAPHY N/A 03/11/2020   Procedure: LEFT HEART CATH AND CORS/GRAFTS ANGIOGRAPHY;  Surgeon: Isaias Cowman, MD;  Location: [place] CV LAB;  Service: Cardiovascular;  Laterality: N/A;   TONSILLECTOMY     VASCULAR SURGERY       Home Meds: Prior to Admission medications   Medication Sig Start Date End Date Taking? Authorizing Provider  oxyCODONE-acetaminophen (PERCOCET) 10-325 MG tablet TAKE 1 TABLET BY MOUTH EVERY 4 HOURS AS NEEDED FOR PAIN 04/09/21   Birdie Sons,  MD  Accu-Chek Softclix Lancets lancets Use as instructed to check sugar daily for type 2 diabetes. 03/03/21   Birdie Sons, MD  albuterol (VENTOLIN HFA) 108 (90 Base) MCG/ACT inhaler INHALE 2 PUFFS BY MOUTH EVERY 6 HOURS AS NEEDED FOR SHORTNESS OF BREATH 05/13/20   Birdie Sons, MD  allopurinol (ZYLOPRIM) 300 MG tablet TAKE 1 TABLET BY MOUTH TWICE A DAY 02/05/21   Birdie Sons, MD  ALPRAZolam Duanne Moron) 1 MG tablet Take 1 tablet (1 mg total) by mouth 3 (three) times daily. 03/10/21   Birdie Sons, MD  amLODipine (NORVASC) 2.5 MG tablet Take 1 tablet (2.5 mg total) by mouth daily. 09/24/20   Birdie Sons, MD  aspirin 81 MG chewable tablet Chew 81 mg by mouth daily. Swallows whole    [provider]  atorvastatin (LIPITOR) 80 MG tablet TAKE ONE TABLET BY MOUTH AT BEDTIME 02/11/21   Birdie Sons, MD  Blood Glucose Calibration (ACCU-CHEK GUIDE CONTROL) LIQD Use with blood glucose monitor as directed 02/20/21   Birdie Sons, MD  Blood Glucose Monitoring Suppl (ACCU-CHEK GUIDE) w/Device KIT  Use to check blood sugars as directed 02/20/21   Birdie Sons, MD  BREO ELLIPTA 100-25 MCG/INH AEPB INHALE 1 PUFF INTO THE LUNGS DAILY 03/19/21   Birdie Sons, MD  budesonide (PULMICORT) 0.5 MG/2ML nebulizer solution Take 0.5 mg by nebulization 2 (two) times daily as needed (shortness of breath or wheezing).    [provider]  cetirizine (ZYRTEC) 10 MG tablet TAKE ONE TABLET BY MOUTH AT BEDTIME 12/10/20   Birdie Sons, MD  docusate sodium (COLACE) 100 MG capsule Take 100 mg by mouth daily as needed for mild constipation.    [provider]  ELIQUIS 5 MG TABS tablet TAKE 1 TABLET BY MOUTH TWICE A DAY 05/01/20   [name], [name], MD  FARXIGA 10 MG TABS tablet TAKE 1 TABLET BY MOUTH DAILY Patient taking differently: Take 10 mg by mouth daily. 06/13/20   Birdie Sons, MD  glucose blood (ACCU-CHEK GUIDE) test strip Use as instructed to check sugar daily for type 2  diabetes. 03/03/21   Birdie Sons, MD  isosorbide mononitrate (IMDUR) 120 MG 24 hr tablet TAKE 1 TABLET BY MOUTH DAILY 01/22/21   Birdie Sons, MD  LINZESS 72 MCG capsule Take 72 mcg by mouth daily before breakfast.     [provider]  lisinopril (ZESTRIL) 10 MG tablet TAKE 1 TABLET BY MOUTH DAILY 08/19/20   Birdie Sons, MD  magnesium oxide (MAG-OX) 400 (241.3 Mg) MG tablet TAKE 1 TABLET BY MOUTH EVERY MORNING 01/18/21   Birdie Sons, MD  metFORMIN (GLUCOPHAGE-XR) 500 MG 24 hr tablet Take 500 mg by mouth 2 (two) times daily. 11/19/20   [provider]  methocarbamol (ROBAXIN) 500 MG tablet Take 1 tablet (500 mg total) by mouth every 8 (eight) hours as needed for muscle spasms. 03/10/21   Birdie Sons, MD  naloxone [facility]) nasal spray 4 mg/0.1 mL 1 spray into nostril x1 and may repeat every 2-3 minutes until patient is responsive or EMS arrives 08/21/20   Birdie Sons, MD  nitroGLYCERIN (NITROSTAT) 0.4 MG SL tablet PLACE ONE TABLET UNDER THE TONGUE EVERY 5 MINUTES FOR CHEST PAIN. IF NO RELIEF PAST 3RD TAB GO TO EMERGENCY ROOM 05/10/20   Birdie Sons, MD  omega-3 acid ethyl esters (LOVAZA) 1 g capsule TAKE 4 CAPSULES BY MOUTH DAILY 08/12/20   Birdie Sons, MD  omeprazole (PRILOSEC) 40 MG capsule Take 40 mg by mouth daily.  03/19/20   [provider]  promethazine (PHENERGAN) 25 MG tablet Take 1 tablet (25 mg total) by mouth every 8 (eight) hours as needed for nausea or vomiting. 12/16/20   Birdie Sons, MD  ranolazine (RANEXA) 1000 MG SR tablet Take 1 tablet (1,000 mg total) by mouth 2 (two) times daily. 04/02/21   Birdie Sons, MD  sotalol (BETAPACE) 80 MG tablet Take 40 mg by mouth 2 (two) times daily.    [provider]  SPIRIVA HANDIHALER 18 MCG inhalation capsule PLACE 1 CAPSULE INTO INHALER AND INHALE BY MOUTH ONCE A DAY. 03/19/21   Birdie Sons, MD  torsemide (DEMADEX) 100 MG tablet Take 0.5 tablets (50 mg total) by mouth daily.  05/13/20   Birdie Sons, MD  traZODone (DESYREL) 150 MG tablet TAKE 1 TABLET BY MOUTH AT BEDTIME 04/21/21   Birdie Sons, MD  venlafaxine XR (EFFEXOR-XR) 75 MG 24 hr capsule TAKE 1 CAPSULE BY MOUTH DAILY WITH BREAKFAST 08/19/20   Birdie Sons, MD  Inpatient Medications:   amLODipine  2.5 mg Oral Daily   aspirin  81 mg Oral Daily   atorvastatin  80 mg Oral QHS   fluticasone furoate-vilanterol  1 puff Inhalation Daily   insulin aspart  0-9 Units Subcutaneous Q4H   lisinopril  10 mg Oral Daily   loratadine  10 mg Oral Daily   pantoprazole  40 mg Oral Q1200   ranolazine  1,000 mg Oral BID   sodium chloride flush  3 mL Intravenous Q12H   sotalol  40 mg Oral BID   torsemide  50 mg Oral Daily   traZODone  150 mg Oral QHS    sodium chloride     heparin 1,200 Units/hr (04/27/21 0513)   nitroGLYCERIN 70 mcg/min (04/27/21 0434)    Allergies:  Allergies  Allergen Reactions   Demerol  [Meperidine Hcl]    Demerol [Meperidine] Hives   Jardiance [Empagliflozin] Other (See Comments)    Perineal pain   Prednisone Other (See Comments) and Hypertension    Pt states that this medication puts him in A-fib    Sulfa Antibiotics Hives   Albuterol Sulfate [Albuterol] Palpitations and Other (See Comments)    Pt currently uses this medication.     Morphine Sulfate Nausea And Vomiting, Rash and Other (See Comments)    Pt states that he is only allergic to the tablet form of this medication.      Social History   Socioeconomic History   Marital status: Widowed    Spouse name: Not on file   Number of children: 1   Years of education: Not on file   Highest education level: 10th grade  Occupational History   Occupation: Disabled   Occupation: on Fish farm manager  Tobacco Use   Smoking status: Former    Packs/day: 3.00    Years: 50.00    Pack years: 150.00    Types: Cigarettes    Quit date: 04/22/2013    Years since quitting: 8.0   Smokeless tobacco: Never   Tobacco comments:     Reports not smoking for approx 8 years.  Vaping Use   Vaping Use: Never used  Substance and Sexual Activity   Alcohol use: No    Comment: remotely quit alcohol use. Hx of heavy alcohol use.   Drug use: Yes    Types: Marijuana    Comment: occasionally   Sexual activity: Not on file  Other Topics Concern   Not on file  Social History Narrative   Pt lives in [place] with wife.  Does not routinely exercise.   Social Determinants of Health   Financial Resource Strain: Low Risk    Difficulty of Paying Living Expenses: Not hard at all  Food Insecurity: No Food Insecurity   Worried About Charity fundraiser in the Last Year: Never true   [place] in the Last Year: Never true  Transportation Needs: No Transportation Needs   Lack of Transportation (Medical): No   Lack of Transportation (Non-Medical): No  Physical Activity: Inactive   Days of Exercise per Week: 0 days   Minutes of Exercise per Session: 0 min  Stress: No Stress Concern Present   Feeling of Stress : Only a little  Social Connections: Moderately Isolated   Frequency of Communication with Friends and Family: More than three times a week   Frequency of Social Gatherings with Friends and Family: More than three times a week   Attends Religious Services: More than 4 times per year  Active Member of Clubs or Organizations: No   Attends Archivist Meetings: Never   Marital Status: Widowed  Human resources officer Violence: Not At Risk   Fear of Current or Ex-Partner: No   Emotionally Abused: No   Physically Abused: No   Sexually Abused: No     Family History  Problem Relation Age of Onset   Heart attack Mother    Depression Mother    Heart disease Mother    COPD Mother    Hypertension Mother    Heart attack Father    Diabetes Father    Depression Father    Heart disease Father    Cirrhosis Father    Parkinson's disease Brother      Review of Systems Positive for chest pain Negative  for: General:  chills, fever, night sweats or weight changes.  Cardiovascular: PND orthopnea syncope dizziness  Dermatological skin lesions rashes Respiratory: Cough congestion Urologic: Frequent urination urination at night and hematuria Abdominal: negative for nausea, vomiting, diarrhea, bright red blood per rectum, melena, or hematemesis Neurologic: negative for visual changes, and/or hearing changes  All other systems reviewed and are otherwise negative except as noted above.  Labs: No results for input(s): CKTOTAL, CKMB, TROPONINI in the last 72 hours. Lab Results  Component Value Date   WBC 7.4 04/27/2021   HGB 17.0 04/27/2021   HCT 48.7 04/27/2021   MCV 93.5 04/27/2021   PLT 174 04/27/2021    Recent Labs  Lab 04/27/21 0326  NA 135  K 2.8*  CL 100  CO2 30  BUN 10  CREATININE 0.75  CALCIUM 8.8*  PROT 6.0*  BILITOT 1.1  ALKPHOS 63  ALT 12  AST 16  GLUCOSE 214*   Lab Results  Component Value Date   CHOL 146 04/27/2021   HDL 32 (L) 04/27/2021   LDLCALC 35 04/27/2021   TRIG 397 (H) 04/27/2021   No results found for: DDIMER  Radiology/Studies:  DG Chest Portable 1 View  Result Date: 04/26/2021 CLINICAL DATA:  Chest pain EXAM: PORTABLE CHEST 1 VIEW COMPARISON:  12/10/2020 FINDINGS: Prior CABG. Heart and mediastinal contours are within normal limits. Scarring in the lingula. Right lung clear. No effusions. Heart is normal size. No acute bony abnormality. IMPRESSION: No active disease. Electronically Signed   By: Rolm Baptise M.D.   On: 04/26/2021 22:35    EKG: Normal sinus rhythm otherwise normal EKG  Weights: Filed Weights   04/26/21 2214  Weight: 99.8 kg     Physical Exam: Blood pressure 119/80, pulse 65, temperature 98.1 F (36.7 C), temperature source Oral, resp. rate 16, height 5' 7"  (1.702 m), weight 99.8 kg, SpO2 92 %. Body mass index is 34.46 kg/m. General: Well developed, well nourished, in no acute distress. Head eyes ears nose throat:  Normocephalic, atraumatic, sclera non-icteric, no xanthomas, nares are without discharge. No apparent thyromegaly and/or mass  Lungs: Normal respiratory effort.  n use wheezes, no rales, no rhonchi.  Heart: RRR with normal S1 S2. no murmur gallop, no rub, PMI is normal size and placement, carotid upstroke normal without bruit, jugular venous pressure is normal Abdomen: Soft, non-tender,  distended with normoactive bowel sounds. No hepatomegaly. No rebound/guarding. No obvious abdominal masses. Abdominal aorta is normal size without bruit Extremities: Trace edema. no cyanosis, no clubbing, no ulcers  Peripheral : 2+ bilateral upper extremity pulses, 2+ bilateral femoral pulses, 2+ bilateral dorsal pedal pulse Neuro: Alert and oriented. No facial asymmetry. No focal deficit. Moves all extremities spontaneously. Musculoskeletal:  Normal muscle tone without kyphosis Psych:  Responds to questions appropriately with a normal affect.    Assessment: 65 year old male with known coronary disease status post coronary bypass graft multiple stents hypertension hyperlipidemia and typical angina which currently was relieved by nitroglycerin and also spontaneously without evidence of congestive heart failure or acute coronary syndrome on maximal medications prior to admission.  Will review previous cardiac catheterization there is no primary concerns of recent stenoses needing further intervention and medical management has been recommended with invasive procedures and most recent times  Plan: 1.  Continue observation throughout today with patient ambulating and following for any chest discomfort or other symptoms.  Would reinstate current medical regimen including calcium channel blocker Ranexa and isosorbide as before.  If patient ambulating well with no evidence of progression of significant symptoms or acute coronary syndrome would be okay for discharged home from cardiac standpoint with follow-up this week for  further potential adjustments and/or physiologic assessment. 2.  Continue high intensity cholesterol therapy 3.  Consider cardiac rehabilitation for anginal symptoms  Signed, Corey Skains M.D. [facility] 04/27/2021, 6:08 AM

## 2021-04-27 NOTE — Progress Notes (Signed)
ANTICOAGULATION CONSULT NOTE - Initial Consult  Pharmacy Consult for Heparin  Indication: chest pain/ACS  Allergies  Allergen Reactions   Demerol  [Meperidine Hcl]    Demerol [Meperidine] Hives   Jardiance [Empagliflozin] Other (See Comments)    Perineal pain   Prednisone Other (See Comments) and Hypertension    Pt states that this medication puts him in A-fib    Sulfa Antibiotics Hives   Albuterol Sulfate [Albuterol] Palpitations and Other (See Comments)    Pt currently uses this medication.     Morphine Sulfate Nausea And Vomiting, Rash and Other (See Comments)    Pt states that he is only allergic to the tablet form of this medication.      Patient Measurements: Height: 5' 7"  (170.2 cm) Weight: 99.8 kg (220 lb 0.3 oz) IBW/kg (Calculated) : 66.1 Heparin Dosing Weight: 87.8 kg   Vital Signs: Temp: 98.1 F (36.7 C) (06/11 2212) Temp Source: Oral (06/11 2212) BP: 108/74 (06/12 0435) Pulse Rate: 48 (06/12 0435)  Labs: Recent Labs    04/26/21 2213 04/27/21 0138 04/27/21 0326  HGB 17.0  --  17.0  HCT 51.2  --  48.7  PLT 180  --  174  APTT 34  --  58*  LABPROT 14.4  --   --   INR 1.1  --   --   HEPARINUNFRC 1.04*  --  0.75*  CREATININE 0.69  --  0.75  TROPONINIHS 19* 14 18*    Estimated Creatinine Clearance: 100.9 mL/min (by C-G formula based on SCr of 0.75 mg/dL).   Medical History: Past Medical History:  Diagnosis Date   A-fib ([place])    Anemia    Anginal pain ([place])    Anxiety    Arthritis    Asthma    CAD (coronary artery disease)    a. 2002 CABGx2 (LIMA->LAD, VG->VG->OM1);  b. 09/2012 DES->OM;  c. 03/2015 PTCA of LAD [facility]) in setting of atretic LIMA; d. 05/2015 Cath [facility]): nonobs dzs; e. 06/2015 Cath ([facility]): LM nl, LAD 45p/d ISR, 50d, D1/2 small, LCX 50p/d ISR, OM1 70ost, 30 ISR, VG->OM1 50ost, 41m LIMA->LAD 99p/d - atretic, RCA dom, nl; f.cath 10/16: 40-50%(FFR 0.90) pLAD, 75% (FFR 0.77) mLAD s/p PCI/DES, oRCA 40% (FFR0.95)   Cancer (HCC)    SKIN CANCER ON  BACK   Celiac disease    Chronic diastolic CHF (congestive heart failure) ([place]    a. 06/2009 Echo: EF 60-65%, Gr 1 DD, triv AI, mildly dil LA, nl RV.   COPD (ch[place] obstructive pulmonary disease) ([place]    a. Chronic bronchitis and emphysema.   DDD (degenerative disc disease), lumbar    Diverticulosis    Dysrhythmia    Essential hypertension    GERD (gastroesophageal reflux disease)    History of hiatal hernia    History of kidney stones    H/O   History of tobacco abuse    a. Quit 2014.   Myocardial infarction ([facility] 2002   4 STENTS   Pancreatitis    PSVT (paroxysmal supraventricular tachycardia) ([place]    a. 10/2012 Noted on Zio Patch.   Sleep apnea    LOST CORD TO CPAP -ONLY 02 @ BEDTIME   Tubular adenoma of colon    Type II diabetes mellitus (HCC)     Medications:  (Not in a hospital admission)  Assessment: Pharmacy consulted to dose heparin in this 65year old male admitted with ACS/NSTEMI.   Pt was on Eliquis at home, last dose 6/11 @ 1900.  Goal of Therapy:  Heparin level 0.3-0.7 units/ml aPTT = 66 - 102  Monitor platelets by anticoagulation protocol: Yes   Plan:  6/12 @ 0326:   aPTT = 58 (subtherapeutic)                              HL = 0.75  aPTT is subtherapeutic but HL is still elevated.   Will order Heparin 1300 units IV X 1 bolus and increase drip rate to 1200 units/hr.   Will recheck aPTT 6 hrs after rate change.  Will recheck HL on 6/13 with AM labs.   Jaliana Medellin D 04/27/2021,5:13 AM

## 2021-04-28 DIAGNOSIS — G4700 Insomnia, unspecified: Secondary | ICD-10-CM | POA: Diagnosis present

## 2021-04-28 DIAGNOSIS — I5032 Chronic diastolic (congestive) heart failure: Secondary | ICD-10-CM | POA: Diagnosis present

## 2021-04-28 DIAGNOSIS — Z6834 Body mass index (BMI) 34.0-34.9, adult: Secondary | ICD-10-CM | POA: Diagnosis not present

## 2021-04-28 DIAGNOSIS — Z9981 Dependence on supplemental oxygen: Secondary | ICD-10-CM | POA: Diagnosis not present

## 2021-04-28 DIAGNOSIS — D751 Secondary polycythemia: Secondary | ICD-10-CM | POA: Diagnosis present

## 2021-04-28 DIAGNOSIS — R079 Chest pain, unspecified: Secondary | ICD-10-CM | POA: Diagnosis not present

## 2021-04-28 DIAGNOSIS — G4733 Obstructive sleep apnea (adult) (pediatric): Secondary | ICD-10-CM | POA: Diagnosis present

## 2021-04-28 DIAGNOSIS — M109 Gout, unspecified: Secondary | ICD-10-CM | POA: Diagnosis present

## 2021-04-28 DIAGNOSIS — I110 Hypertensive heart disease with heart failure: Secondary | ICD-10-CM | POA: Diagnosis present

## 2021-04-28 DIAGNOSIS — Z20822 Contact with and (suspected) exposure to covid-19: Secondary | ICD-10-CM | POA: Diagnosis present

## 2021-04-28 DIAGNOSIS — I25118 Atherosclerotic heart disease of native coronary artery with other forms of angina pectoris: Secondary | ICD-10-CM | POA: Diagnosis present

## 2021-04-28 DIAGNOSIS — F39 Unspecified mood [affective] disorder: Secondary | ICD-10-CM | POA: Diagnosis present

## 2021-04-28 DIAGNOSIS — J9610 Chronic respiratory failure, unspecified whether with hypoxia or hypercapnia: Secondary | ICD-10-CM | POA: Diagnosis present

## 2021-04-28 DIAGNOSIS — I252 Old myocardial infarction: Secondary | ICD-10-CM | POA: Diagnosis not present

## 2021-04-28 DIAGNOSIS — I5022 Chronic systolic (congestive) heart failure: Secondary | ICD-10-CM | POA: Diagnosis not present

## 2021-04-28 DIAGNOSIS — J449 Chronic obstructive pulmonary disease, unspecified: Secondary | ICD-10-CM | POA: Diagnosis not present

## 2021-04-28 DIAGNOSIS — E876 Hypokalemia: Secondary | ICD-10-CM | POA: Diagnosis present

## 2021-04-28 DIAGNOSIS — K219 Gastro-esophageal reflux disease without esophagitis: Secondary | ICD-10-CM | POA: Diagnosis present

## 2021-04-28 DIAGNOSIS — E1121 Type 2 diabetes mellitus with diabetic nephropathy: Secondary | ICD-10-CM | POA: Diagnosis present

## 2021-04-28 DIAGNOSIS — I480 Paroxysmal atrial fibrillation: Secondary | ICD-10-CM | POA: Diagnosis present

## 2021-04-28 DIAGNOSIS — E1165 Type 2 diabetes mellitus with hyperglycemia: Secondary | ICD-10-CM | POA: Diagnosis present

## 2021-04-28 DIAGNOSIS — E785 Hyperlipidemia, unspecified: Secondary | ICD-10-CM | POA: Diagnosis present

## 2021-04-28 DIAGNOSIS — I200 Unstable angina: Secondary | ICD-10-CM | POA: Diagnosis not present

## 2021-04-28 DIAGNOSIS — I160 Hypertensive urgency: Secondary | ICD-10-CM | POA: Diagnosis present

## 2021-04-28 DIAGNOSIS — J439 Emphysema, unspecified: Secondary | ICD-10-CM | POA: Diagnosis present

## 2021-04-28 DIAGNOSIS — F419 Anxiety disorder, unspecified: Secondary | ICD-10-CM | POA: Diagnosis present

## 2021-04-28 DIAGNOSIS — Z7901 Long term (current) use of anticoagulants: Secondary | ICD-10-CM | POA: Diagnosis not present

## 2021-04-28 DIAGNOSIS — I4891 Unspecified atrial fibrillation: Secondary | ICD-10-CM | POA: Diagnosis not present

## 2021-04-28 LAB — ECHOCARDIOGRAM COMPLETE
AR max vel: 2.27 cm2
AV Peak grad: 8 mmHg
Ao pk vel: 1.41 m/s
Area-P 1/2: 4.68 cm2
Height: 67 in
S' Lateral: 3.56 cm
Weight: 3520.31 oz

## 2021-04-28 LAB — GLUCOSE, CAPILLARY
Glucose-Capillary: 154 mg/dL — ABNORMAL HIGH (ref 70–99)
Glucose-Capillary: 156 mg/dL — ABNORMAL HIGH (ref 70–99)
Glucose-Capillary: 192 mg/dL — ABNORMAL HIGH (ref 70–99)
Glucose-Capillary: 182 mg/dL — ABNORMAL HIGH (ref 70–99)
Glucose-Capillary: 183 mg/dL — ABNORMAL HIGH (ref 70–99)
Glucose-Capillary: 175 mg/dL — ABNORMAL HIGH (ref 70–99)

## 2021-04-28 LAB — BASIC METABOLIC PANEL
Anion gap: 4 — ABNORMAL LOW (ref 5–15)
BUN: 17 mg/dL (ref 8–23)
CO2: 36 mmol/L — ABNORMAL HIGH (ref 22–32)
Calcium: 8.8 mg/dL — ABNORMAL LOW (ref 8.9–10.3)
Chloride: 98 mmol/L (ref 98–111)
Creatinine, Ser: 1.16 mg/dL (ref 0.61–1.24)
GFR, Estimated: >60 mL/min (ref >60)
Glucose, Bld: 155 mg/dL — ABNORMAL HIGH (ref 70–99)
Potassium: 4.2 mmol/L (ref 3.5–5.1)
Sodium: 138 mmol/L (ref 135–145)

## 2021-04-28 LAB — HEMOGLOBIN A1C
Hgb A1c MFr Bld: 7.4 % — ABNORMAL HIGH (ref 4.8–5.6)
Mean Plasma Glucose: 166 mg/dL

## 2021-04-28 NOTE — Progress Notes (Signed)
[facility] Encounter Note  Patient: Lawrence [name] / Admit Date: 04/26/2021 / Date of Encounter: 04/28/2021, 8:32 AM   Subjective: Patient apparently had some substernal chest discomfort throughout yesterday occurring spontaneously as well as with activities but still somewhat atypical in nature.  His initial chest discomfort did not respond to nitroglycerin or other medication management other than morphine.  We have reviewed his cardiac catheterization from before and showed no evidence of particular potential for intervention.  Last cardiac catheterization showed no need for intervention and he is maximally medicated for his anginal symptoms.  It does appear clinically that this is not cardiac in nature.  The patient's troponin has been normal with no evidence of myocardial infarction and EKG is unchanged  Review of Systems: Positive for: Chest pain Negative for: Vision change, hearing change, syncope, dizziness, nausea, vomiting,diarrhea, bloody stool, stomach pain, cough, congestion, diaphoresis, urinary frequency, urinary pain,skin lesions, skin rashes Others previously listed  Objective: Telemetry: Normal sinus rhythm Physical Exam: Blood pressure 105/68, pulse (!) 51, temperature 98.2 F (36.8 C), temperature source Oral, resp. rate 18, height 5' 7"  (1.702 m), weight 99.8 kg, SpO2 96 %. Body mass index is 34.46 kg/m. General: Well developed, well nourished, in no acute distress. Head: Normocephalic, atraumatic, sclera non-icteric, no xanthomas, nares are without discharge. Neck: No apparent masses Lungs: Normal respirations with no wheezes, no rhonchi, no rales , no crackles   Heart: Regular rate and rhythm, normal S1 S2, no murmur, no rub, no gallop, PMI is normal size and placement, carotid upstroke normal without bruit, jugular venous pressure normal Abdomen: Soft, non-tender, non-distended with normoactive bowel sounds. No hepatosplenomegaly. Abdominal aorta  is normal size without bruit Extremities: No edema, no clubbing, no cyanosis, no ulcers,  Peripheral: 2+ radial, 2+ femoral, 2+ dorsal pedal pulses Neuro: Alert and oriented. Moves all extremities spontaneously. Psych:  Responds to questions appropriately with a normal affect.   Intake/Output Summary (Last 24 hours) at 04/28/2021 0832 Last data filed at 04/27/2021 2027 Gross per 24 hour  Intake 364.76 ml  Output 1700 ml  Net -1335.24 ml    Inpatient Medications:   amLODipine  2.5 mg Oral Daily   apixaban  5 mg Oral BID   aspirin  81 mg Oral Daily   atorvastatin  80 mg Oral QHS   fluticasone furoate-vilanterol  1 puff Inhalation Daily   insulin aspart  0-9 Units Subcutaneous Q4H   isosorbide mononitrate  120 mg Oral Daily   lisinopril  10 mg Oral Daily   loratadine  10 mg Oral Daily   pantoprazole  40 mg Oral Q1200   ranolazine  1,000 mg Oral BID   sodium chloride flush  3 mL Intravenous Q12H   sotalol  40 mg Oral BID   torsemide  50 mg Oral Daily   traZODone  150 mg Oral QHS   Infusions:   sodium chloride      Labs: Recent Labs    04/26/21 2213 04/27/21 0326  NA 140 135  K 3.6 2.8*  CL 102 100  CO2 32 30  GLUCOSE 132* 214*  BUN 9 10  CREATININE 0.69 0.75  CALCIUM 9.2 8.8*  MG 2.2 2.0  PHOS  --  4.5   Recent Labs    04/26/21 2213 04/27/21 0326  AST 14* 16  ALT 14 12  ALKPHOS 70 63  BILITOT 1.1 1.1  PROT 6.9 6.0*  ALBUMIN 4.0 3.6   Recent Labs    04/26/21 2213 04/27/21  0326  WBC 7.6 7.4  NEUTROABS 4.1 3.9  HGB 17.0 17.0  HCT 51.2 48.7  MCV 94.5 93.5  PLT 180 174   No results for input(s): CKTOTAL, CKMB, TROPONINI in the last 72 hours. Invalid input(s): POCBNP No results for input(s): HGBA1C in the last 72 hours.   Weights: Filed Weights   04/26/21 2214  Weight: 99.8 kg     Radiology/Studies:  DG Chest Portable 1 View  Result Date: 04/26/2021 CLINICAL DATA:  Chest pain EXAM: PORTABLE CHEST 1 VIEW COMPARISON:  12/10/2020 FINDINGS:  Prior CABG. Heart and mediastinal contours are within normal limits. Scarring in the lingula. Right lung clear. No effusions. Heart is normal size. No acute bony abnormality. IMPRESSION: No active disease. Electronically Signed   By: Rolm Baptise M.D.   On: 04/26/2021 22:35   ECHOCARDIOGRAM COMPLETE  Result Date: 04/28/2021    ECHOCARDIOGRAM REPORT   Patient Name:   Lawrence [name] [facility] Date of Exam: 04/27/2021 Medical Rec #:  [id number]       Height:       67.0 in Accession #:    [id number]      Weight:       220.0 lb Date of Birth:  09/02/1956       BSA:          2.106 m Patient Age:    65 years        BP:           119/80 mmHg Patient Gender: M               HR:           116 bpm. Exam Location:  [facility] Procedure: 2D Echo and Intracardiac Opacification Agent Indications:     Chest Pain R07.9  History:         Patient has no prior history of Echocardiogram examinations.  Sonographer:     Kathlen Brunswick RDCS Referring Phys:  [place] Diagnosing Phys: [name] MD  Sonographer Comments: Technically difficult study due to poor echo windows. Image acquisition challenging due to respiratory motion. IMPRESSIONS  1. Left ventricular ejection fraction, by estimation, is 45 to 50%. The left ventricle has mildly decreased function. The left ventricle has no regional wall motion abnormalities. Left ventricular diastolic parameters were normal.  2. Right ventricular systolic function is normal. The right ventricular size is normal.  3. The mitral valve is normal in structure. Trivial mitral valve regurgitation.  4. The aortic valve is normal in structure. Aortic valve regurgitation is trivial. FINDINGS  Left Ventricle: Left ventricular ejection fraction, by estimation, is 45 to 50%. The left ventricle has mildly decreased function. The left ventricle has no regional wall motion abnormalities. Definity contrast agent was given IV to delineate the left ventricular endocardial borders. The left ventricular  internal cavity size was normal in size. There is no left ventricular hypertrophy. Left ventricular diastolic parameters were normal. Right Ventricle: The right ventricular size is normal. No increase in right ventricular wall thickness. Right ventricular systolic function is normal. Left Atrium: Left atrial size was normal in size. Right Atrium: Right atrial size was normal in size. Pericardium: There is no evidence of pericardial effusion. Mitral Valve: The mitral valve is normal in structure. Trivial mitral valve regurgitation. Tricuspid Valve: The tricuspid valve is normal in structure. Tricuspid valve regurgitation is trivial. Aortic Valve: The aortic valve is normal in structure. Aortic valve regurgitation is trivial. Aortic valve peak gradient measures 8.0 mmHg. Pulmonic Valve:  The pulmonic valve was normal in structure. Pulmonic valve regurgitation is not visualized. Aorta: The aortic root and ascending aorta are structurally normal, with no evidence of dilitation. IAS/Shunts: No atrial level shunt detected by color flow Doppler.  LEFT VENTRICLE PLAX 2D LVIDd:         5.37 cm  Diastology LVIDs:         3.56 cm  LV e' medial:    10.40 cm/s LV PW:         1.39 cm  LV E/e' medial:  5.4 LV IVS:        1.21 cm  LV e' lateral:   12.40 cm/s LVOT diam:     1.90 cm  LV E/e' lateral: 4.5 LV SV:         53 LV SV Index:   25 LVOT Area:     2.84 cm  RIGHT VENTRICLE RV Basal diam:  3.26 cm RV S prime:     8.16 cm/s TAPSE (M-mode): 1.6 cm LEFT ATRIUM             Index LA diam:        3.70 cm 1.76 cm/m LA Vol (A2C):   40.9 ml 19.42 ml/m LA Vol (A4C):   47.6 ml 22.60 ml/m LA Biplane Vol: 46.1 ml 21.89 ml/m  AORTIC VALVE                PULMONIC VALVE AV Area (Vmax): 2.27 cm    PV Vmax:       0.77 m/s AV Vmax:        141.00 cm/s PV Peak grad:  2.4 mmHg AV Peak Grad:   8.0 mmHg LVOT Vmax:      113.00 cm/s LVOT Vmean:     77.000 cm/s LVOT VTI:       0.186 m  AORTA Ao Root diam: 3.10 cm Ao Asc diam:  3.60 cm MITRAL VALVE MV  Area (PHT): 4.68 cm    SHUNTS MV Decel Time: 162 msec    Systemic VTI:  0.19 m MV E velocity: 56.10 cm/s  Systemic Diam: 1.90 cm [name] MD Electronically signed by [name] MD Signature Date/Time: 04/28/2021/8:29:52 AM    Final      Assessment and Recommendation  65 y.o. male with known coronary artery disease status post previous infarct PCI and stent placement in left circumflex and left anterior descending artery with atypical chest discomfort only relieved by morphine and no current evidence of acute coronary syndrome 1.  Continue current medical regimen which is high intensity cholesterol therapy and multiple antianginals as well for her current symptoms of chest discomfort.  Ambulate and follow for improvements of symptoms.  If ambulating well okay for discharge to home with follow-up at a later date.  The patient may need further investigation of type of chest pain if it is only relieved by morphine 2.  Continue medication management for atrial fibrillation with anticoagulation as well as sotalol for further risk reduction of atrial fibrillation and stroke 3.  Furosemide for lower extremity edema previous LV systolic dysfunction 4.  High intensity cholesterol therapy 5.  Follow-up in 1 week if necessary  Signed, [name] M.D. FACC

## 2021-04-28 NOTE — Progress Notes (Signed)
[facility] PROGRESS NOTE    Lawrence [name]  SPQ:[id number] DOB: 30-May-1956 DOA: 04/26/2021 PCP: Birdie Sons, MD      Brief Narrative:  [name] is a 65 y.o. M with emphysema, COPD on chronic oxygen, A. fib on Eliquis, CAD s/p CABG with chronic angina, HTN, MD CHF who presented with chest pain at rest.  Evidently patient was working on a home project when he developed chest pain, typical for his angina.  This did not improve with multiple nitroglycerin, so he came to the ER.  In the ER, ECG unremarkable, troponins negative, his chest pain was ongoing so he was started on nitroglycerin and heparin and admitted by the hospitalist service.       Assessment & Plan:  Chest pain Coronary atherosclerosis History stable Initially started on heparin and admitted for unstable angina.  Evaluated by Cardiology who felt this was stable, so heparin was stopped.  Recommended restarting home anti-anginals.    Patient reports to me no significant improvement in his pain.  He required IV dilaudid x3 and Norco x4 yesterday, and so far has used IV dilaudid x2 and Norco x1 already. -Consult cardiology, appreciate recommendations - Continue amlodipine, Imdur, Ranexa - Continue aspirin, statin  Hypertension Blood pressure low normal - Continue lisinopril, amlodipine, Imdur  Paroxysmal atrial fibrillation Rate controlled - Continue apixaban, sotalol  COPD No active symptoms - Continue ICS/LABA - Continue loratadine, PPI   Chronic diastolic CHF Appears euvolemic - Continue torsemide  Insomnia - Continue trazodone  Hypokalemia Mag, K repleted   Gout - Hold allopurinol  Mood disorder - Hold venlafaxine  Diabetes Glucoses controlled - Hold Farxiga, metformin - Continue sliding scale corrections       Disposition: Status is: Inpatient  Remains inpatient appropriate because: he has ongoing chest pain requiring frequent dosing of IV opiates for  relief  and ongoing evaluation pending by Cardiology  Dispo: The patient is from: Home              Anticipated d/c is to: Home              Patient currently is not medically stable to d/c.   Difficult to place patient No             Level of care: Progressive Cardiac       MDM: The below labs and imaging reports were reviewed and summarized above.  Medication management as above.  Is requiring frequent dosing of IV Dilaudid    DVT prophylaxis:  apixaban (ELIQUIS) tablet 5 mg  Code Status: Full code Family Communication:         Subjective: Still with chest pain, worse with exertion.  Still some nausea.  Still fatigued.  No confusion, cough, fever.  No urinary irritative symptoms.  Objective: Vitals:   04/27/21 2007 04/28/21 0732 04/28/21 1155 04/28/21 1205  BP: 124/82 105/68 110/70 116/70  Pulse: (!) 57 (!) 51 (!) 52 (!) 52  Resp: 19 18  18   Temp: 98.4 F (36.9 C) 98.2 F (36.8 C)  98.4 F (36.9 C)  TempSrc:  Oral  Oral  SpO2: 96% 96%  97%  Weight:      Height:        Intake/Output Summary (Last 24 hours) at 04/28/2021 1355 Last data filed at 04/28/2021 1330 Gross per 24 hour  Intake 720 ml  Output 1575 ml  Net -855 ml   Filed Weights   04/26/21 2214  Weight: 99.8 kg  Examination: General appearance:  adult male, alert and in mild distress due to pain.   HEENT: Anicteric, conjunctiva pink, lids and lashes normal. No nasal deformity, discharge, epistaxis.   Skin: Warm and dry.  No jaundice.  No suspicious rashes or lesions. Cardiac: RRR, nl S1-S2, no murmurs appreciated.  Capillary refill is brisk.  JVP normal.  No LE edema.  Radial  pulses 2+ and symmetric. Respiratory: Normal respiratory rate and rhythm.  CTAB without rales or wheezes. Abdomen: Abdomen soft.  No TTP or guarding. No ascites, distension, hepatosplenomegaly.   MSK: No deformities or effusions. Neuro: Awake and alert.  EOMI, moves all extremities. Speech fluent.    Psych:  Sensorium intact and responding to questions, attention normal. Affect normal.  Judgment and insight appear normal.    Data Reviewed: I have personally reviewed following labs and imaging studies:  CBC: Recent Labs  Lab 04/26/21 2213 04/27/21 0326  WBC 7.6 7.4  NEUTROABS 4.1 3.9  HGB 17.0 17.0  HCT 51.2 48.7  MCV 94.5 93.5  PLT 180 388   Basic Metabolic Panel: Recent Labs  Lab 04/26/21 2213 04/27/21 0326 04/28/21 0813  NA 140 135 138  K 3.6 2.8* 4.2  CL 102 100 98  CO2 32 30 36*  GLUCOSE 132* 214* 155*  BUN 9 10 17   CREATININE 0.69 0.75 1.16  CALCIUM 9.2 8.8* 8.8*  MG 2.2 2.0  --   PHOS  --  4.5  --    GFR: Estimated Creatinine Clearance: 69.6 mL/min (by C-G formula based on SCr of 1.16 mg/dL). Liver Function Tests: Recent Labs  Lab 04/26/21 2213 04/27/21 0326  AST 14* 16  ALT 14 12  ALKPHOS 70 63  BILITOT 1.1 1.1  PROT 6.9 6.0*  ALBUMIN 4.0 3.6   Recent Labs  Lab 04/26/21 2213  LIPASE 30   No results for input(s): AMMONIA in the last 168 hours. Coagulation Profile: Recent Labs  Lab 04/26/21 2213  INR 1.1   Cardiac Enzymes: No results for input(s): CKTOTAL, CKMB, CKMBINDEX, TROPONINI in the last 168 hours. BNP (last 3 results) No results for input(s): PROBNP in the last 8760 hours. HbA1C: Recent Labs    04/27/21 0326  HGBA1C 7.4*   CBG: Recent Labs  Lab 04/27/21 2004 04/27/21 2307 04/28/21 0347 04/28/21 0732 04/28/21 1205  GLUCAP 162* 171* 154* 156* 192*   Lipid Profile: Recent Labs    04/27/21 0326  CHOL 146  HDL 32*  LDLCALC 35  TRIG 397*  CHOLHDL 4.6   Thyroid Function Tests: Recent Labs    04/27/21 0326  TSH 2.065   Anemia Panel: No results for input(s): VITAMINB12, FOLATE, FERRITIN, TIBC, IRON, RETICCTPCT in the last 72 hours. Urine analysis:    Component Value Date/Time   COLORURINE STRAW (A) 04/26/2021 2213   APPEARANCEUR CLEAR (A) 04/26/2021 2213   APPEARANCEUR Clear 05/25/2019 1554   LABSPEC 1.006  04/26/2021 2213   LABSPEC 1.012 03/07/2015 2143   PHURINE 6.0 04/26/2021 2213   GLUCOSEU >=500 (A) 04/26/2021 2213   GLUCOSEU Negative 03/07/2015 2143   HGBUR NEGATIVE 04/26/2021 2213   BILIRUBINUR NEGATIVE 04/26/2021 2213   BILIRUBINUR Negative 05/25/2019 1554   BILIRUBINUR Negative 03/07/2015 2143   KETONESUR NEGATIVE 04/26/2021 2213   PROTEINUR NEGATIVE 04/26/2021 2213   UROBILINOGEN 0.2 03/26/2017 0907   NITRITE NEGATIVE 04/26/2021 2213   LEUKOCYTESUR NEGATIVE 04/26/2021 2213   LEUKOCYTESUR Negative 03/07/2015 2143   Sepsis Labs: @LABRCNTIP (procalcitonin:4,lacticacidven:4)  ) Recent Results (from the past 240 hour(s))  Resp  Panel by RT-PCR (Flu A&B, Covid) Nasopharyngeal Swab     Status: None   Collection Time: 04/26/21 10:13 PM   Specimen: Nasopharyngeal Swab; Nasopharyngeal(NP) swabs in vial transport medium  Result Value Ref Range Status   SARS Coronavirus 2 by RT PCR NEGATIVE NEGATIVE Final    Comment: (NOTE) SARS-CoV-2 target nucleic acids are NOT DETECTED.  The SARS-CoV-2 RNA is generally detectable in upper respiratory specimens during the acute phase of infection. The lowest concentration of SARS-CoV-2 viral copies this assay can detect is 138 copies/mL. A negative result does not preclude SARS-Cov-2 infection and should not be used as the sole basis for treatment or other patient management decisions. A negative result may occur with  improper specimen collection/handling, submission of specimen other than nasopharyngeal swab, presence of viral mutation(s) within the areas targeted by this assay, and inadequate number of viral copies(<138 copies/mL). A negative result must be combined with clinical observations, patient history, and epidemiological information. The expected result is Negative.  Fact Sheet for Patients:  EntrepreneurPulse.com.au  Fact Sheet for Healthcare Providers:  IncredibleEmployment.be  This test is  no t yet approved or cleared by the [place] FDA and  has been authorized for detection and/or diagnosis of SARS-CoV-2 by FDA under an Emergency Use Authorization (EUA). This EUA will remain  in effect (meaning this test can be used) for the duration of the COVID-19 declaration under Section 564(b)(1) of the Act, 21 U.S.C.section 360bbb-3(b)(1), unless the authorization is terminated  or revoked sooner.       Influenza A by PCR NEGATIVE NEGATIVE Final   Influenza B by PCR NEGATIVE NEGATIVE Final    Comment: (NOTE) The Xpert Xpress SARS-CoV-2/FLU/RSV plus assay is intended as an aid in the diagnosis of influenza from Nasopharyngeal swab specimens and should not be used as a sole basis for treatment. Nasal washings and aspirates are unacceptable for Xpert Xpress SARS-CoV-2/FLU/RSV testing.  Fact Sheet for Patients: EntrepreneurPulse.com.au  Fact Sheet for Healthcare Providers: IncredibleEmployment.be  This test is not yet approved or cleared by the [place] FDA and has been authorized for detection and/or diagnosis of SARS-CoV-2 by FDA under an Emergency Use Authorization (EUA). This EUA will remain in effect (meaning this test can be used) for the duration of the COVID-19 declaration under Section 564(b)(1) of the Act, 21 U.S.C. section 360bbb-3(b)(1), unless the authorization is terminated or revoked.  Performed at [facility], [address], [place], [place] [address]          Radiology Studies: DG Chest Portable 1 View  Result Date: 04/26/2021 CLINICAL DATA:  Chest pain EXAM: PORTABLE CHEST 1 VIEW COMPARISON:  12/10/2020 FINDINGS: Prior CABG. Heart and mediastinal contours are within normal limits. Scarring in the lingula. Right lung clear. No effusions. Heart is normal size. No acute bony abnormality. IMPRESSION: No active disease. Electronically Signed   By: Rolm Baptise M.D.   On: 04/26/2021 22:35    ECHOCARDIOGRAM COMPLETE  Result Date: 04/28/2021    ECHOCARDIOGRAM REPORT   Patient Name:   Lawrence [name] [facility] Date of Exam: 04/27/2021 Medical Rec #:  [id number]       Height:       67.0 in Accession #:    [id number]      Weight:       220.0 lb Date of Birth:  30-Aug-1956       BSA:          2.106 m Patient Age:    73 years  BP:           119/80 mmHg Patient Gender: M               HR:           116 bpm. Exam Location:  [facility] Procedure: 2D Echo and Intracardiac Opacification Agent Indications:     Chest Pain R07.9  History:         Patient has no prior history of Echocardiogram examinations.  Sonographer:     Kathlen Brunswick RDCS Referring Phys:  [place] Diagnosing Phys: [name] MD  Sonographer Comments: Technically difficult study due to poor echo windows. Image acquisition challenging due to respiratory motion. IMPRESSIONS  1. Left ventricular ejection fraction, by estimation, is 45 to 50%. The left ventricle has mildly decreased function. The left ventricle has no regional wall motion abnormalities. Left ventricular diastolic parameters were normal.  2. Right ventricular systolic function is normal. The right ventricular size is normal.  3. The mitral valve is normal in structure. Trivial mitral valve regurgitation.  4. The aortic valve is normal in structure. Aortic valve regurgitation is trivial. FINDINGS  Left Ventricle: Left ventricular ejection fraction, by estimation, is 45 to 50%. The left ventricle has mildly decreased function. The left ventricle has no regional wall motion abnormalities. Definity contrast agent was given IV to delineate the left ventricular endocardial borders. The left ventricular internal cavity size was normal in size. There is no left ventricular hypertrophy. Left ventricular diastolic parameters were normal. Right Ventricle: The right ventricular size is normal. No increase in right ventricular wall thickness. Right ventricular systolic function is  normal. Left Atrium: Left atrial size was normal in size. Right Atrium: Right atrial size was normal in size. Pericardium: There is no evidence of pericardial effusion. Mitral Valve: The mitral valve is normal in structure. Trivial mitral valve regurgitation. Tricuspid Valve: The tricuspid valve is normal in structure. Tricuspid valve regurgitation is trivial. Aortic Valve: The aortic valve is normal in structure. Aortic valve regurgitation is trivial. Aortic valve peak gradient measures 8.0 mmHg. Pulmonic Valve: The pulmonic valve was normal in structure. Pulmonic valve regurgitation is not visualized. Aorta: The aortic root and ascending aorta are structurally normal, with no evidence of dilitation. IAS/Shunts: No atrial level shunt detected by color flow Doppler.  LEFT VENTRICLE PLAX 2D LVIDd:         5.37 cm  Diastology LVIDs:         3.56 cm  LV e' medial:    10.40 cm/s LV PW:         1.39 cm  LV E/e' medial:  5.4 LV IVS:        1.21 cm  LV e' lateral:   12.40 cm/s LVOT diam:     1.90 cm  LV E/e' lateral: 4.5 LV SV:         53 LV SV Index:   25 LVOT Area:     2.84 cm  RIGHT VENTRICLE RV Basal diam:  3.26 cm RV S prime:     8.16 cm/s TAPSE (M-mode): 1.6 cm LEFT ATRIUM             Index LA diam:        3.70 cm 1.76 cm/m LA Vol (A2C):   40.9 ml 19.42 ml/m LA Vol (A4C):   47.6 ml 22.60 ml/m LA Biplane Vol: 46.1 ml 21.89 ml/m  AORTIC VALVE  PULMONIC VALVE AV Area (Vmax): 2.27 cm    PV Vmax:       0.77 m/s AV Vmax:        141.00 cm/s PV Peak grad:  2.4 mmHg AV Peak Grad:   8.0 mmHg LVOT Vmax:      113.00 cm/s LVOT Vmean:     77.000 cm/s LVOT VTI:       0.186 m  AORTA Ao Root diam: 3.10 cm Ao Asc diam:  3.60 cm MITRAL VALVE MV Area (PHT): 4.68 cm    SHUNTS MV Decel Time: 162 msec    Systemic VTI:  0.19 m MV E velocity: 56.10 cm/s  Systemic Diam: 1.90 cm [name] MD Electronically signed by [name] MD Signature Date/Time: 04/28/2021/8:29:52 AM    Final         Scheduled Meds:   amLODipine  2.5 mg Oral Daily   apixaban  5 mg Oral BID   aspirin  81 mg Oral Daily   atorvastatin  80 mg Oral QHS   fluticasone furoate-vilanterol  1 puff Inhalation Daily   insulin aspart  0-9 Units Subcutaneous Q4H   isosorbide mononitrate  120 mg Oral Daily   lisinopril  10 mg Oral Daily   loratadine  10 mg Oral Daily   pantoprazole  40 mg Oral Q1200   ranolazine  1,000 mg Oral BID   sodium chloride flush  3 mL Intravenous Q12H   sotalol  40 mg Oral BID   torsemide  50 mg Oral Daily   traZODone  150 mg Oral QHS   Continuous Infusions:  sodium chloride       LOS: 0 days    Time spent: 25  minutes    Edwin Dada, MD [facility] 04/28/2021, 1:55 PM     Please page though AMION or Epic secure chat:  For [employer], Adult nurse

## 2021-04-29 ENCOUNTER — Other Ambulatory Visit: Payer: Self-pay | Admitting: Family Medicine

## 2021-04-29 ENCOUNTER — Telehealth: Payer: Self-pay

## 2021-04-29 LAB — GLUCOSE, CAPILLARY
Glucose-Capillary: 146 mg/dL — ABNORMAL HIGH (ref 70–99)
Glucose-Capillary: 162 mg/dL — ABNORMAL HIGH (ref 70–99)
Glucose-Capillary: 177 mg/dL — ABNORMAL HIGH (ref 70–99)
Glucose-Capillary: 191 mg/dL — ABNORMAL HIGH (ref 70–99)
Glucose-Capillary: 201 mg/dL — ABNORMAL HIGH (ref 70–99)
Glucose-Capillary: 242 mg/dL — ABNORMAL HIGH (ref 70–99)
Glucose-Capillary: 254 mg/dL — ABNORMAL HIGH (ref 70–99)

## 2021-04-29 MED ORDER — SODIUM CHLORIDE 0.9% FLUSH
3.0000 mL | Freq: Two times a day (BID) | INTRAVENOUS | Status: DC
  Administered 2021-04-29 – 2021-05-01 (×3): 3 mL via INTRAVENOUS

## 2021-04-29 NOTE — Progress Notes (Signed)
[facility] PROGRESS NOTE    MURLE OTTING  LAG:[id number] DOB: July 31, 1956 DOA: 04/26/2021 PCP: Birdie Sons, MD      Brief Narrative:  [name] is a 65 y.o. M with emphysema, COPD on chronic oxygen, A. fib on Eliquis, CAD s/p CABG with chronic angina, HTN, MD CHF who presented with chest pain at rest.  Evidently patient was working on a home project when he developed chest pain, typical for his angina.  This did not improve with multiple nitroglycerin, so he came to the ER.  In the ER, ECG unremarkable, troponins negative, his chest pain was ongoing so he was started on nitroglycerin and heparin and admitted by the hospitalist service.       Assessment & Plan:  Chest pain Coronary atherosclerosis History stable angina Initially started on heparin and admitted for unstable angina.  Evaluated by Cardiology who felt this was stable, so heparin was stopped.  Recommended restarting home anti-anginals.    Patient still reports to me no significant improvement in his pain.  He reports that the pain worsens with exertion.  Even at rest, he has pain requiring frequent opiates, both IV Dilaudid and Norco every 3-4 hours.   - Consult cardiology, appreciate recommendations - Continue amlodipine, Imdur, Ranexa - Continue aspirin, statin  Hypertension Blood pressure low normal - Continue lisinopril, amlodipine, Imdur  Paroxysmal atrial fibrillation Rate controlled, - Continue apixaban, sotalol  COPD No wheezing - Continue ICS/LABA - Continue loratadine, PPI   Chronic diastolic CHF Appears euvolemic - Continue home torsemide  Insomnia - Continue trazodone  Hypokalemia Mag, K repleted   Gout - Hold allopurinol  Mood disorder - Hold venlafaxine  Diabetes Glucoses controlled - Hold Farxiga, metformin - Continue sliding scale corrections       Disposition: Status is: Inpatient  Remains inpatient appropriate because: he has ongoing  chest pain requiring frequent dosing of IV opiates for relief  and ongoing evaluation pending by Cardiology  Dispo: The patient is from: Home              Anticipated d/c is to: Home              Patient currently is not medically stable to d/c.   Difficult to place patient No             Level of care: Progressive Cardiac       MDM: The below labs and imaging reports were reviewed and summarized above.  Medication management as above.  Is requiring frequent dosing of IV Dilaudid    DVT prophylaxis:  apixaban (ELIQUIS) tablet 5 mg  Code Status: Full code Family Communication:         Subjective: No change to chest pain which is exertional.  No cough, fever, sputum.  No nausea or vomiting.  No diarrhea.  Objective: Vitals:   04/29/21 0803 04/29/21 1015 04/29/21 1141 04/29/21 1641  BP: 131/81  (!) 130/91 105/66  Pulse: (!) 53  62 (!) 59  Resp: 16  18 18   Temp: (!) 97.5 F (36.4 C)  (!) 97.2 F (36.2 C) 97.8 F (36.6 C)  TempSrc: Oral     SpO2: 94%  91% (!) 89%  Weight:  97.6 kg    Height:        Intake/Output Summary (Last 24 hours) at 04/29/2021 1815 Last data filed at 04/29/2021 1639 Gross per 24 hour  Intake 2040 ml  Output 4200 ml  Net -2160 ml   Filed  Weights   04/26/21 2214 04/29/21 1015  Weight: 99.8 kg 97.6 kg    Examination: General appearance: Adult male, lying in bed, no acute distress, appears uncomfortable     HEENT:    Skin:  Cardiac: RRR, no murmurs appreciated, no lower extremity edema Respiratory: Normal respiratory rate and rhythm, lungs clear without rales wheezing Abdomen: Abdomen soft tenderness palpation or guarding, no ascites or distention MSK:  Neuro: Awake and alert, extraocular movements intact, moves all extremities with normal strength and coordination, speech fluent Psych: Awake and alert,, oriented to person, place, and time, attention normal     Data Reviewed: I have personally reviewed following labs and  imaging studies:  CBC: Recent Labs  Lab 04/26/21 2213 04/27/21 0326  WBC 7.6 7.4  NEUTROABS 4.1 3.9  HGB 17.0 17.0  HCT 51.2 48.7  MCV 94.5 93.5  PLT 180 629   Basic Metabolic Panel: Recent Labs  Lab 04/26/21 2213 04/27/21 0326 04/28/21 0813  NA 140 135 138  K 3.6 2.8* 4.2  CL 102 100 98  CO2 32 30 36*  GLUCOSE 132* 214* 155*  BUN 9 10 17   CREATININE 0.69 0.75 1.16  CALCIUM 9.2 8.8* 8.8*  MG 2.2 2.0  --   PHOS  --  4.5  --    GFR: Estimated Creatinine Clearance: 68.8 mL/min (by C-G formula based on SCr of 1.16 mg/dL). Liver Function Tests: Recent Labs  Lab 04/26/21 2213 04/27/21 0326  AST 14* 16  ALT 14 12  ALKPHOS 70 63  BILITOT 1.1 1.1  PROT 6.9 6.0*  ALBUMIN 4.0 3.6   Recent Labs  Lab 04/26/21 2213  LIPASE 30   No results for input(s): AMMONIA in the last 168 hours. Coagulation Profile: Recent Labs  Lab 04/26/21 2213  INR 1.1   Cardiac Enzymes: No results for input(s): CKTOTAL, CKMB, CKMBINDEX, TROPONINI in the last 168 hours. BNP (last 3 results) No results for input(s): PROBNP in the last 8760 hours. HbA1C: Recent Labs    04/27/21 0326  HGBA1C 7.4*   CBG: Recent Labs  Lab 04/29/21 0011 04/29/21 0416 04/29/21 0805 04/29/21 1142 04/29/21 1643  GLUCAP 242* 191* 177* 201* 254*   Lipid Profile: Recent Labs    04/27/21 0326  CHOL 146  HDL 32*  LDLCALC 35  TRIG 397*  CHOLHDL 4.6   Thyroid Function Tests: Recent Labs    04/27/21 0326  TSH 2.065   Anemia Panel: No results for input(s): VITAMINB12, FOLATE, FERRITIN, TIBC, IRON, RETICCTPCT in the last 72 hours. Urine analysis:    Component Value Date/Time   COLORURINE STRAW (A) 04/26/2021 2213   APPEARANCEUR CLEAR (A) 04/26/2021 2213   APPEARANCEUR Clear 05/25/2019 1554   LABSPEC 1.006 04/26/2021 2213   LABSPEC 1.012 03/07/2015 2143   PHURINE 6.0 04/26/2021 2213   GLUCOSEU >=500 (A) 04/26/2021 2213   GLUCOSEU Negative 03/07/2015 2143   HGBUR NEGATIVE 04/26/2021 2213    BILIRUBINUR NEGATIVE 04/26/2021 2213   BILIRUBINUR Negative 05/25/2019 1554   BILIRUBINUR Negative 03/07/2015 2143   KETONESUR NEGATIVE 04/26/2021 2213   PROTEINUR NEGATIVE 04/26/2021 2213   UROBILINOGEN 0.2 03/26/2017 0907   NITRITE NEGATIVE 04/26/2021 2213   LEUKOCYTESUR NEGATIVE 04/26/2021 2213   LEUKOCYTESUR Negative 03/07/2015 2143   Sepsis Labs: @LABRCNTIP (procalcitonin:4,lacticacidven:4)  ) Recent Results (from the past 240 hour(s))  Resp Panel by RT-PCR (Flu A&B, Covid) Nasopharyngeal Swab     Status: None   Collection Time: 04/26/21 10:13 PM   Specimen: Nasopharyngeal Swab; Nasopharyngeal(NP) swabs in vial  transport medium  Result Value Ref Range Status   SARS Coronavirus 2 by RT PCR NEGATIVE NEGATIVE Final    Comment: (NOTE) SARS-CoV-2 target nucleic acids are NOT DETECTED.  The SARS-CoV-2 RNA is generally detectable in upper respiratory specimens during the acute phase of infection. The lowest concentration of SARS-CoV-2 viral copies this assay can detect is 138 copies/mL. A negative result does not preclude SARS-Cov-2 infection and should not be used as the sole basis for treatment or other patient management decisions. A negative result may occur with  improper specimen collection/handling, submission of specimen other than nasopharyngeal swab, presence of viral mutation(s) within the areas targeted by this assay, and inadequate number of viral copies(<138 copies/mL). A negative result must be combined with clinical observations, patient history, and epidemiological information. The expected result is Negative.  Fact Sheet for Patients:  EntrepreneurPulse.com.au  Fact Sheet for Healthcare Providers:  IncredibleEmployment.be  This test is no t yet approved or cleared by the [place] FDA and  has been authorized for detection and/or diagnosis of SARS-CoV-2 by FDA under an Emergency Use Authorization (EUA). This EUA will  remain  in effect (meaning this test can be used) for the duration of the COVID-19 declaration under Section 564(b)(1) of the Act, 21 U.S.C.section 360bbb-3(b)(1), unless the authorization is terminated  or revoked sooner.       Influenza A by PCR NEGATIVE NEGATIVE Final   Influenza B by PCR NEGATIVE NEGATIVE Final    Comment: (NOTE) The Xpert Xpress SARS-CoV-2/FLU/RSV plus assay is intended as an aid in the diagnosis of influenza from Nasopharyngeal swab specimens and should not be used as a sole basis for treatment. Nasal washings and aspirates are unacceptable for Xpert Xpress SARS-CoV-2/FLU/RSV testing.  Fact Sheet for Patients: EntrepreneurPulse.com.au  Fact Sheet for Healthcare Providers: IncredibleEmployment.be  This test is not yet approved or cleared by the [place] FDA and has been authorized for detection and/or diagnosis of SARS-CoV-2 by FDA under an Emergency Use Authorization (EUA). This EUA will remain in effect (meaning this test can be used) for the duration of the COVID-19 declaration under Section 564(b)(1) of the Act, 21 U.S.C. section 360bbb-3(b)(1), unless the authorization is terminated or revoked.  Performed at [facility], [address] [place], [place] [address]          Radiology Studies: No results found.      Scheduled Meds:  amLODipine  2.5 mg Oral Daily   apixaban  5 mg Oral BID   aspirin  81 mg Oral Daily   atorvastatin  80 mg Oral QHS   fluticasone furoate-vilanterol  1 puff Inhalation Daily   insulin aspart  0-9 Units Subcutaneous Q4H   isosorbide mononitrate  120 mg Oral Daily   lisinopril  10 mg Oral Daily   loratadine  10 mg Oral Daily   pantoprazole  40 mg Oral Q1200   ranolazine  1,000 mg Oral BID   sodium chloride flush  3 mL Intravenous Q12H   sodium chloride flush  3 mL Intravenous Q12H   sotalol  40 mg Oral BID   torsemide  50 mg Oral Daily   traZODone  150 mg  Oral QHS   Continuous Infusions:  sodium chloride       LOS: 1 day    Time spent: 25  minutes    Edwin Dada, MD [facility] 04/29/2021, 6:15 PM     Please page though AMION or Epic secure chat:  For [employer], contact  charge nurse

## 2021-04-29 NOTE — Progress Notes (Signed)
[facility] Encounter Note  Patient: Lawrence [name] / Admit Date: 04/26/2021 / Date of Encounter: 04/29/2021, 9:05 AM   Subjective: Patient apparently had some substernal chest discomfort throughout yesterday occurring spontaneously as well as with activities but still appears non cardiac  His initial chest discomfort did not respond to nitroglycerin or other medication management other than morphine. And dilaudid  We have reviewed his cardiac catheterization from before and showed no evidence of particular potential for intervention.  Last cardiac catheterization showed no need for intervention and he is maximally medicated for his anginal symptoms.  It does appear clinically that this is not cardiac in nature.  The patient's troponin has been normal with no evidence of myocardial infarction and EKG is unchanged  Furtehr discussion with his primary cardiolodist reveals that he suggests ok for dc to home without further cahnges in meds and no intervention Review of Systems: Positive for: Chest pain Negative for: Vision change, hearing change, syncope, dizziness, nausea, vomiting,diarrhea, bloody stool, stomach pain, cough, congestion, diaphoresis, urinary frequency, urinary pain,skin lesions, skin rashes Others previously listed  Objective: Telemetry: Normal sinus rhythm Physical Exam: Blood pressure 131/81, pulse (!) 53, temperature (!) 97.5 F (36.4 C), temperature source Oral, resp. rate 16, height 5' 7"  (1.702 m), weight 99.8 kg, SpO2 94 %. Body mass index is 34.46 kg/m. General: Well developed, well nourished, in no acute distress. Head: Normocephalic, atraumatic, sclera non-icteric, no xanthomas, nares are without discharge. Neck: No apparent masses Lungs: Normal respirations with no wheezes, no rhonchi, no rales , no crackles   Heart: Regular rate and rhythm, normal S1 S2, no murmur, no rub, no gallop, PMI is normal size and placement, carotid upstroke normal  without bruit, jugular venous pressure normal Abdomen: Soft, non-tender, non-distended with normoactive bowel sounds. No hepatosplenomegaly. Abdominal aorta is normal size without bruit Extremities: No edema, no clubbing, no cyanosis, no ulcers,  Peripheral: 2+ radial, 2+ femoral, 2+ dorsal pedal pulses Neuro: Alert and oriented. Moves all extremities spontaneously. Psych:  Responds to questions appropriately with a normal affect.   Intake/Output Summary (Last 24 hours) at 04/29/2021 0905 Last data filed at 04/29/2021 0856 Gross per 24 hour  Intake 2400 ml  Output 4125 ml  Net -1725 ml     Inpatient Medications:   amLODipine  2.5 mg Oral Daily   apixaban  5 mg Oral BID   aspirin  81 mg Oral Daily   atorvastatin  80 mg Oral QHS   fluticasone furoate-vilanterol  1 puff Inhalation Daily   insulin aspart  0-9 Units Subcutaneous Q4H   isosorbide mononitrate  120 mg Oral Daily   lisinopril  10 mg Oral Daily   loratadine  10 mg Oral Daily   pantoprazole  40 mg Oral Q1200   ranolazine  1,000 mg Oral BID   sodium chloride flush  3 mL Intravenous Q12H   sotalol  40 mg Oral BID   torsemide  50 mg Oral Daily   traZODone  150 mg Oral QHS   Infusions:   sodium chloride      Labs: Recent Labs    04/26/21 2213 04/27/21 0326 04/28/21 0813  NA 140 135 138  K 3.6 2.8* 4.2  CL 102 100 98  CO2 32 30 36*  GLUCOSE 132* 214* 155*  BUN 9 10 17   CREATININE 0.69 0.75 1.16  CALCIUM 9.2 8.8* 8.8*  MG 2.2 2.0  --   PHOS  --  4.5  --  Recent Labs    04/26/21 2213 04/27/21 0326  AST 14* 16  ALT 14 12  ALKPHOS 70 63  BILITOT 1.1 1.1  PROT 6.9 6.0*  ALBUMIN 4.0 3.6    Recent Labs    04/26/21 2213 04/27/21 0326  WBC 7.6 7.4  NEUTROABS 4.1 3.9  HGB 17.0 17.0  HCT 51.2 48.7  MCV 94.5 93.5  PLT 180 174    No results for input(s): CKTOTAL, CKMB, TROPONINI in the last 72 hours. Invalid input(s): POCBNP Recent Labs    04/27/21 0326  HGBA1C 7.4*      Weights: Filed  Weights   04/26/21 2214  Weight: 99.8 kg     Radiology/Studies:  DG Chest Portable 1 View  Result Date: 04/26/2021 CLINICAL DATA:  Chest pain EXAM: PORTABLE CHEST 1 VIEW COMPARISON:  12/10/2020 FINDINGS: Prior CABG. Heart and mediastinal contours are within normal limits. Scarring in the lingula. Right lung clear. No effusions. Heart is normal size. No acute bony abnormality. IMPRESSION: No active disease. Electronically Signed   By: Rolm Baptise M.D.   On: 04/26/2021 22:35   ECHOCARDIOGRAM COMPLETE  Result Date: 04/28/2021    ECHOCARDIOGRAM REPORT   Patient Name:   Lawrence [name] [facility] Date of Exam: 04/27/2021 Medical Rec #:  [id number]       Height:       67.0 in Accession #:    [id number]      Weight:       220.0 lb Date of Birth:  08/28/1956       BSA:          2.106 m Patient Age:    86 years        BP:           119/80 mmHg Patient Gender: M               HR:           116 bpm. Exam Location:  [facility] Procedure: 2D Echo and Intracardiac Opacification Agent Indications:     Chest Pain R07.9  History:         Patient has no prior history of Echocardiogram examinations.  Sonographer:     Kathlen Brunswick RDCS Referring Phys:  Lawrence [name] Diagnosing Phys: Lawrence Royals MD  Sonographer Comments: Technically difficult study due to poor echo windows. Image acquisition challenging due to respiratory motion. IMPRESSIONS  1. Left ventricular ejection fraction, by estimation, is 45 to 50%. The left ventricle has mildly decreased function. The left ventricle has no regional wall motion abnormalities. Left ventricular diastolic parameters were normal.  2. Right ventricular systolic function is normal. The right ventricular size is normal.  3. The mitral valve is normal in structure. Trivial mitral valve regurgitation.  4. The aortic valve is normal in structure. Aortic valve regurgitation is trivial. FINDINGS  Left Ventricle: Left ventricular ejection fraction, by estimation, is 45 to 50%. The left  ventricle has mildly decreased function. The left ventricle has no regional wall motion abnormalities. Definity contrast agent was given IV to delineate the left ventricular endocardial borders. The left ventricular internal cavity size was normal in size. There is no left ventricular hypertrophy. Left ventricular diastolic parameters were normal. Right Ventricle: The right ventricular size is normal. No increase in right ventricular wall thickness. Right ventricular systolic function is normal. Left Atrium: Left atrial size was normal in size. Right Atrium: Right atrial size was normal in size. Pericardium: There is no evidence of pericardial effusion. Mitral Valve:  The mitral valve is normal in structure. Trivial mitral valve regurgitation. Tricuspid Valve: The tricuspid valve is normal in structure. Tricuspid valve regurgitation is trivial. Aortic Valve: The aortic valve is normal in structure. Aortic valve regurgitation is trivial. Aortic valve peak gradient measures 8.0 mmHg. Pulmonic Valve: The pulmonic valve was normal in structure. Pulmonic valve regurgitation is not visualized. Aorta: The aortic root and ascending aorta are structurally normal, with no evidence of dilitation. IAS/Shunts: No atrial level shunt detected by color flow Doppler.  LEFT VENTRICLE PLAX 2D LVIDd:         5.37 cm  Diastology LVIDs:         3.56 cm  LV e' medial:    10.40 cm/s LV PW:         1.39 cm  LV E/e' medial:  5.4 LV IVS:        1.21 cm  LV e' lateral:   12.40 cm/s LVOT diam:     1.90 cm  LV E/e' lateral: 4.5 LV SV:         53 LV SV Index:   25 LVOT Area:     2.84 cm  RIGHT VENTRICLE RV Basal diam:  3.26 cm RV S prime:     8.16 cm/s TAPSE (M-mode): 1.6 cm LEFT ATRIUM             Index LA diam:        3.70 cm 1.76 cm/m LA Vol (A2C):   40.9 ml 19.42 ml/m LA Vol (A4C):   47.6 ml 22.60 ml/m LA Biplane Vol: 46.1 ml 21.89 ml/m  AORTIC VALVE                PULMONIC VALVE AV Area (Vmax): 2.27 cm    PV Vmax:       0.77 m/s AV  Vmax:        141.00 cm/s PV Peak grad:  2.4 mmHg AV Peak Grad:   8.0 mmHg LVOT Vmax:      113.00 cm/s LVOT Vmean:     77.000 cm/s LVOT VTI:       0.186 m  AORTA Ao Root diam: 3.10 cm Ao Asc diam:  3.60 cm MITRAL VALVE MV Area (PHT): 4.68 cm    SHUNTS MV Decel Time: 162 msec    Systemic VTI:  0.19 m MV E velocity: 56.10 cm/s  Systemic Diam: 1.90 cm Lawrence Royals MD Electronically signed by Lawrence Royals MD Signature Date/Time: 04/28/2021/8:29:52 AM    Final      Assessment and Recommendation  65 y.o. male with known coronary artery disease status post previous infarct PCI and stent placement in left circumflex and left anterior descending artery with atypical chest discomfort only relieved by morphine and no current evidence of acute coronary syndrome and not consistant with true cardiac angina 1.  Continue current medical regimen which is high intensity cholesterol therapy and multiple antianginals as well for her current symptoms of chest discomfort.  Ambulate and follow for improvements of symptoms.  If ambulating well okay for discharge to home with follow-up at a later date.  The patient may need further investigation of type of non cardiac chest pain if it is only relieved by morphine and dilaudid 2.  Continue medication management for atrial fibrillation with anticoagulation as well as sotalol for further risk reduction of atrial fibrillation and stroke 3.  Furosemide for lower extremity edema previous LV systolic dysfunction 4.  High intensity cholesterol therapy 5.  Follow-up in 1 week  if necessary  Signed, Lawrence [name] M.D. FACC

## 2021-04-29 NOTE — Telephone Encounter (Signed)
Copied from [place] [id number]. Topic: General - Other >> Apr 29, 2021  3:19 PM Leward Quan A wrote: Reason for CRM: April with [facility] called in to check the status of a form that was faxed over on 04/23/21 to be completed by Dr [name] would like to have that back please and will fax over again today just in case the first was misplaced or not received. Any questions please call April at Ph# [phone]

## 2021-04-30 DIAGNOSIS — I5032 Chronic diastolic (congestive) heart failure: Secondary | ICD-10-CM

## 2021-04-30 DIAGNOSIS — I5042 Chronic combined systolic (congestive) and diastolic (congestive) heart failure: Secondary | ICD-10-CM

## 2021-04-30 DIAGNOSIS — I5022 Chronic systolic (congestive) heart failure: Secondary | ICD-10-CM

## 2021-04-30 LAB — CBC
HCT: 51.6 % (ref 39.0–52.0)
Hemoglobin: 17 g/dL (ref 13.0–17.0)
MCH: 31.6 pg (ref 26.0–34.0)
MCHC: 32.9 g/dL (ref 30.0–36.0)
MCV: 95.9 fL (ref 80.0–100.0)
Platelets: 175 10*3/uL (ref 150–400)
RBC: 5.38 MIL/uL (ref 4.22–5.81)
RDW: 13.6 % (ref 11.5–15.5)
WBC: 7.1 10*3/uL (ref 4.0–10.5)
nRBC: 0 % (ref 0.0–0.2)

## 2021-04-30 LAB — BASIC METABOLIC PANEL
Anion gap: 5 (ref 5–15)
BUN: 31 mg/dL — ABNORMAL HIGH (ref 8–23)
CO2: 37 mmol/L — ABNORMAL HIGH (ref 22–32)
Calcium: 9.3 mg/dL (ref 8.9–10.3)
Chloride: 95 mmol/L — ABNORMAL LOW (ref 98–111)
Creatinine, Ser: 1.12 mg/dL (ref 0.61–1.24)
GFR, Estimated: >60 mL/min (ref >60)
Glucose, Bld: 176 mg/dL — ABNORMAL HIGH (ref 70–99)
Potassium: 3.9 mmol/L (ref 3.5–5.1)
Sodium: 137 mmol/L (ref 135–145)

## 2021-04-30 LAB — GLUCOSE, CAPILLARY
Glucose-Capillary: 179 mg/dL — ABNORMAL HIGH (ref 70–99)
Glucose-Capillary: 173 mg/dL — ABNORMAL HIGH (ref 70–99)
Glucose-Capillary: 186 mg/dL — ABNORMAL HIGH (ref 70–99)
Glucose-Capillary: 167 mg/dL — ABNORMAL HIGH (ref 70–99)
Glucose-Capillary: 192 mg/dL — ABNORMAL HIGH (ref 70–99)
Glucose-Capillary: 141 mg/dL — ABNORMAL HIGH (ref 70–99)

## 2021-04-30 MED ORDER — ALLOPURINOL 300 MG PO TABS
300.0000 mg | ORAL_TABLET | Freq: Two times a day (BID) | ORAL | Status: DC
  Administered 2021-04-30 – 2021-05-02 (×4): 300 mg via ORAL
  Filled 2021-04-30 (×6): qty 1

## 2021-04-30 MED ORDER — SODIUM CHLORIDE 0.9% FLUSH: 3.0000 mL | INTRAVENOUS | Status: DC | PRN

## 2021-04-30 MED ORDER — SODIUM CHLORIDE 0.9 % IV SOLN: 250.0000 mL | INTRAVENOUS | Status: DC | PRN

## 2021-04-30 MED ORDER — SODIUM CHLORIDE 0.9 % WEIGHT BASED INFUSION: 3.0000 mL/kg/h | INTRAVENOUS | Status: AC

## 2021-04-30 MED ORDER — SODIUM CHLORIDE 0.9 % WEIGHT BASED INFUSION
1.0000 mL/kg/h | INTRAVENOUS | Status: DC
  Administered 2021-05-01: 1 mL/kg/h via INTRAVENOUS

## 2021-04-30 MED ORDER — ASPIRIN 81 MG PO CHEW: 81.0000 mg | CHEWABLE_TABLET | ORAL | Status: DC

## 2021-04-30 MED ORDER — VENLAFAXINE HCL ER 75 MG PO CP24
75.0000 mg | ORAL_CAPSULE | Freq: Every day | ORAL | Status: DC
  Administered 2021-04-30 – 2021-05-02 (×3): 75 mg via ORAL
  Filled 2021-04-30 (×3): qty 1

## 2021-04-30 NOTE — Progress Notes (Signed)
[facility] Encounter Note  Patient: Lawrence [name] / Admit Date: 04/26/2021 / Date of Encounter: 04/30/2021, 8:44 AM   Subjective: Patient apparently had some substernal chest discomfort throughout yesterday occurring spontaneously as well as with activities but still appears non cardiac  His initial chest discomfort did not respond to nitroglycerin or other medication management other than morphine. And dilaudid  We have reviewed his cardiac catheterization from before and showed no evidence of particular potential for intervention.  Last cardiac catheterization showed no need for intervention and he is maximally medicated for his anginal symptoms.  It does appear clinically that this is not cardiac in nature.  The patient's troponin has been normal with no evidence of myocardial infarction and EKG is unchanged  Furtehr discussion with his primary cardiolodist reveals that he suggests ok for dc to home without further cahnges in meds and no intervention but with contnued pain will plan for cath to assess fully for possible cad causing symptoms after washout of eliquis Review of Systems: Positive for: Chest pain Negative for: Vision change, hearing change, syncope, dizziness, nausea, vomiting,diarrhea, bloody stool, stomach pain, cough, congestion, diaphoresis, urinary frequency, urinary pain,skin lesions, skin rashes Others previously listed  Objective: Telemetry: Normal sinus rhythm Physical Exam: Blood pressure 114/75, pulse (!) 52, temperature 97.9 F (36.6 C), resp. rate 17, height 5' 7"  (1.702 m), weight 97.1 kg, SpO2 99 %. Body mass index is 33.53 kg/m. General: Well developed, well nourished, in no acute distress. Head: Normocephalic, atraumatic, sclera non-icteric, no xanthomas, nares are without discharge. Neck: No apparent masses Lungs: Normal respirations with no wheezes, no rhonchi, no rales , no crackles   Heart: Regular rate and rhythm, normal S1 S2, no  murmur, no rub, no gallop, PMI is normal size and placement, carotid upstroke normal without bruit, jugular venous pressure normal Abdomen: Soft, non-tender, non-distended with normoactive bowel sounds. No hepatosplenomegaly. Abdominal aorta is normal size without bruit Extremities: No edema, no clubbing, no cyanosis, no ulcers,  Peripheral: 2+ radial, 2+ femoral, 2+ dorsal pedal pulses Neuro: Alert and oriented. Moves all extremities spontaneously. Psych:  Responds to questions appropriately with a normal affect.   Intake/Output Summary (Last 24 hours) at 04/30/2021 0844 Last data filed at 04/30/2021 0715 Gross per 24 hour  Intake 360 ml  Output 2800 ml  Net -2440 ml     Inpatient Medications:   allopurinol  300 mg Oral BID   amLODipine  2.5 mg Oral Daily   aspirin  81 mg Oral Daily   [START ON 05/01/2021] aspirin  81 mg Oral Pre-Cath   atorvastatin  80 mg Oral QHS   fluticasone furoate-vilanterol  1 puff Inhalation Daily   insulin aspart  0-9 Units Subcutaneous Q4H   isosorbide mononitrate  120 mg Oral Daily   lisinopril  10 mg Oral Daily   loratadine  10 mg Oral Daily   pantoprazole  40 mg Oral Q1200   ranolazine  1,000 mg Oral BID   sodium chloride flush  3 mL Intravenous Q12H   sodium chloride flush  3 mL Intravenous Q12H   sotalol  40 mg Oral BID   torsemide  50 mg Oral Daily   traZODone  150 mg Oral QHS   venlafaxine XR  75 mg Oral Q breakfast   Infusions:   sodium chloride     sodium chloride     [START ON 05/01/2021] sodium chloride     Followed by   Derrill Memo ON 05/01/2021] sodium chloride  Labs: Recent Labs    04/28/21 0813 04/30/21 0539  NA 138 137  K 4.2 3.9  CL 98 95*  CO2 36* 37*  GLUCOSE 155* 176*  BUN 17 31*  CREATININE 1.16 1.12  CALCIUM 8.8* 9.3    No results for input(s): AST, ALT, ALKPHOS, BILITOT, PROT, ALBUMIN in the last 72 hours.  Recent Labs    04/30/21 0539  WBC 7.1  HGB 17.0  HCT 51.6  MCV 95.9  PLT 175    No results for  input(s): CKTOTAL, CKMB, TROPONINI in the last 72 hours. Invalid input(s): POCBNP No results for input(s): HGBA1C in the last 72 hours.    Weights: Filed Weights   04/26/21 2214 04/29/21 1015 04/30/21 0652  Weight: 99.8 kg 97.6 kg 97.1 kg     Radiology/Studies:  DG Chest Portable 1 View  Result Date: 04/26/2021 CLINICAL DATA:  Chest pain EXAM: PORTABLE CHEST 1 VIEW COMPARISON:  12/10/2020 FINDINGS: Prior CABG. Heart and mediastinal contours are within normal limits. Scarring in the lingula. Right lung clear. No effusions. Heart is normal size. No acute bony abnormality. IMPRESSION: No active disease. Electronically Signed   By: Rolm Baptise M.D.   On: 04/26/2021 22:35   ECHOCARDIOGRAM COMPLETE  Result Date: 04/28/2021    ECHOCARDIOGRAM REPORT   Patient Name:   Lawrence [name] [facility] Date of Exam: 04/27/2021 Medical Rec #:  [id number]       Height:       67.0 in Accession #:    [id number]      Weight:       220.0 lb Date of Birth:  04/12/1956       BSA:          2.106 m Patient Age:    3 years        BP:           119/80 mmHg Patient Gender: M               HR:           116 bpm. Exam Location:  [facility] Procedure: 2D Echo and Intracardiac Opacification Agent Indications:     Chest Pain R07.9  History:         Patient has no prior history of Echocardiogram examinations.  Sonographer:     Kathlen Brunswick RDCS Referring Phys:  [place] Diagnosing Phys: [name] MD  Sonographer Comments: Technically difficult study due to poor echo windows. Image acquisition challenging due to respiratory motion. IMPRESSIONS  1. Left ventricular ejection fraction, by estimation, is 45 to 50%. The left ventricle has mildly decreased function. The left ventricle has no regional wall motion abnormalities. Left ventricular diastolic parameters were normal.  2. Right ventricular systolic function is normal. The right ventricular size is normal.  3. The mitral valve is normal in structure. Trivial mitral valve  regurgitation.  4. The aortic valve is normal in structure. Aortic valve regurgitation is trivial. FINDINGS  Left Ventricle: Left ventricular ejection fraction, by estimation, is 45 to 50%. The left ventricle has mildly decreased function. The left ventricle has no regional wall motion abnormalities. Definity contrast agent was given IV to delineate the left ventricular endocardial borders. The left ventricular internal cavity size was normal in size. There is no left ventricular hypertrophy. Left ventricular diastolic parameters were normal. Right Ventricle: The right ventricular size is normal. No increase in right ventricular wall thickness. Right ventricular systolic function is normal. Left Atrium: Left atrial size was normal in  size. Right Atrium: Right atrial size was normal in size. Pericardium: There is no evidence of pericardial effusion. Mitral Valve: The mitral valve is normal in structure. Trivial mitral valve regurgitation. Tricuspid Valve: The tricuspid valve is normal in structure. Tricuspid valve regurgitation is trivial. Aortic Valve: The aortic valve is normal in structure. Aortic valve regurgitation is trivial. Aortic valve peak gradient measures 8.0 mmHg. Pulmonic Valve: The pulmonic valve was normal in structure. Pulmonic valve regurgitation is not visualized. Aorta: The aortic root and ascending aorta are structurally normal, with no evidence of dilitation. IAS/Shunts: No atrial level shunt detected by color flow Doppler.  LEFT VENTRICLE PLAX 2D LVIDd:         5.37 cm  Diastology LVIDs:         3.56 cm  LV e' medial:    10.40 cm/s LV PW:         1.39 cm  LV E/e' medial:  5.4 LV IVS:        1.21 cm  LV e' lateral:   12.40 cm/s LVOT diam:     1.90 cm  LV E/e' lateral: 4.5 LV SV:         53 LV SV Index:   25 LVOT Area:     2.84 cm  RIGHT VENTRICLE RV Basal diam:  3.26 cm RV S prime:     8.16 cm/s TAPSE (M-mode): 1.6 cm LEFT ATRIUM             Index LA diam:        3.70 cm 1.76 cm/m LA Vol  (A2C):   40.9 ml 19.42 ml/m LA Vol (A4C):   47.6 ml 22.60 ml/m LA Biplane Vol: 46.1 ml 21.89 ml/m  AORTIC VALVE                PULMONIC VALVE AV Area (Vmax): 2.27 cm    PV Vmax:       0.77 m/s AV Vmax:        141.00 cm/s PV Peak grad:  2.4 mmHg AV Peak Grad:   8.0 mmHg LVOT Vmax:      113.00 cm/s LVOT Vmean:     77.000 cm/s LVOT VTI:       0.186 m  AORTA Ao Root diam: 3.10 cm Ao Asc diam:  3.60 cm MITRAL VALVE MV Area (PHT): 4.68 cm    SHUNTS MV Decel Time: 162 msec    Systemic VTI:  0.19 m MV E velocity: 56.10 cm/s  Systemic Diam: 1.90 cm [name] MD Electronically signed by [name] MD Signature Date/Time: 04/28/2021/8:29:52 AM    Final      Assessment and Recommendation  65 y.o. male with known coronary artery disease status post previous infarct PCI and stent placement in left circumflex and left anterior descending artery with atypical chest discomfort only relieved by morphine and no current evidence of acute coronary syndrome and not consistant with true cardiac angina 1.  Continue current medical regimen which is high intensity cholesterol therapy and multiple antianginals as well for her current symptoms of chest discomfort.  Ambulate and follow for improvements of symptoms.  If ambulating well okay for discharge to home with follow-up at a later date.  The patient may need further investigation of type of non cardiac chest pain if it is only relieved by morphine and dilaudid and plan for cath tomorrow 2.  Abstain from anticoagulation  and continue sotalol for further risk reduction of atrial fibrillation and stroke 3.  Furosemide  for lower extremity edema previous LV systolic dysfunction 4.  High intensity cholesterol therapy    Signed, [name] M.D. FACC

## 2021-04-30 NOTE — Progress Notes (Signed)
[place] [facility] [place] NAME: Lawrence [name]    MR#:  [id number]  DATE OF BIRTH:  Mar 06, 1956  SUBJECTIVE:  Pt came in with CP  REVIEW OF SYSTEMS:   Review of Systems  Constitutional:  Negative for chills, fever and weight loss.  HENT:  Negative for ear discharge, ear pain and nosebleeds.   Eyes:  Negative for blurred vision, pain and discharge.  Respiratory:  Negative for sputum production, shortness of breath, wheezing and stridor.   Cardiovascular:  Negative for chest pain, palpitations, orthopnea and PND.  Gastrointestinal:  Negative for abdominal pain, diarrhea, nausea and vomiting.  Genitourinary:  Negative for frequency and urgency.  Musculoskeletal:  Negative for back pain and joint pain.  Neurological:  Negative for sensory change, speech change, focal weakness and weakness.  Psychiatric/Behavioral:  Negative for depression and hallucinations. The patient is not nervous/anxious.   Tolerating Diet:yes Tolerating PT:   DRUG ALLERGIES:   Allergies  Allergen Reactions   Demerol  [Meperidine Hcl]    Demerol [Meperidine] Hives   Jardiance [Empagliflozin] Other (See Comments)    Perineal pain   Prednisone Other (See Comments) and Hypertension    Pt states that this medication puts him in A-fib    Sulfa Antibiotics Hives   Albuterol Sulfate [Albuterol] Palpitations and Other (See Comments)    Pt currently uses this medication.     Morphine Sulfate Nausea And Vomiting, Rash and Other (See Comments)    Pt states that he is only allergic to the tablet form of this medication.      VITALS:  Blood pressure 96/63, pulse (!) 58, temperature 98.6 F (37 C), temperature source Oral, resp. rate 16, height 5' 7"  (1.702 m), weight 97.1 kg, SpO2 100 %.  PHYSICAL EXAMINATION:   Physical Exam  GENERAL:  65 y.o.-year-old patient lying in the bed with no acute distress.  LUNGS: Normal breath sounds bilaterally, no wheezing, rales, rhonchi. No  use of accessory muscles of respiration.  CARDIOVASCULAR: S1, S2 normal. No murmurs, rubs, or gallops.  ABDOMEN: Soft, nontender, nondistended. Bowel sounds present. No organomegaly or mass.  EXTREMITIES: No cyanosis, clubbing or edema b/l.    NEUROLOGIC: non focal PSYCHIATRIC:  patient is alert and oriented x 3.  SKIN: No obvious rash, lesion, or ulcer.   LABORATORY PANEL:  CBC Recent Labs  Lab 04/30/21 0539  WBC 7.1  HGB 17.0  HCT 51.6  PLT 175    Chemistries  Recent Labs  Lab 04/27/21 0326 04/28/21 0813 04/30/21 0539  NA 135   < > 137  K 2.8*   < > 3.9  CL 100   < > 95*  CO2 30   < > 37*  GLUCOSE 214*   < > 176*  BUN 10   < > 31*  CREATININE 0.75   < > 1.12  CALCIUM 8.8*   < > 9.3  MG 2.0  --   --   AST 16  --   --   ALT 12  --   --   ALKPHOS 63  --   --   BILITOT 1.1  --   --    < > = values in this interval not displayed.   Cardiac Enzymes No results for input(s): TROPONINI in the last 168 hours. RADIOLOGY:  No results found. ASSESSMENT AND PLAN:   Lawrence [name] is a 65 y.o. M with emphysema, COPD on chronic oxygen, A. fib on Eliquis, CAD s/p  CABG with chronic angina, HTN, MD CHF who presented with chest pain at rest.   Chest pain Coronary atherosclerosis History stable angina --Initially started on heparin and admitted for unstable angina.  Evaluated by Cardiology who felt this was stable, so heparin was stopped.  Recommended restarting home anti-anginals.   - Consult cardiology with Dr [name]-- cardiac cath plan for 6/16 - Continue amlodipine, Imdur, Ranexa - Continue aspirin, statin  Hypertension Blood pressure low normal - Continue lisinopril, amlodipine, Imdur  Paroxysmal atrial fibrillation Rate controlled, - Continue apixaban (onhold for cath) sotalol  COPD No wheezing - Continue ICS/LABA - Continue loratadine, PPI   Chronic diastolic CHF Appears euvolemic - Continue home torsemide  Insomnia - Continue  trazodone  Hypokalemia Mag, K repleted   Gout - on allopurinol  Mood disorder - on venlafaxine   Diabetes Glucoses controlled - Hold Farxiga, metformin - Continue sliding scale corrections        Procedures: Family communication :son Lawrence [name] Consults : [facility] cardiology CODE STATUS: full DVT Prophylaxis : Level of care: Progressive Cardiac Status is: Inpatient  Remains inpatient appropriate because:Inpatient level of care appropriate due to severity of illness  Dispo: The patient is from: Home              Anticipated d/c is to: Home              Patient currently is not medically stable to d/c.   Difficult to place patient No   discharge pending cardiac cath     TOTAL TIME TAKING CARE OF THIS PATIENT: 30 minutes.  >50% time spent on counselling and coordination of care  Note: This dictation was prepared with Dragon dictation along with smaller phrase technology. Any transcriptional errors that result from this process are unintentional.  Lawrence [name] M.D    [facility]   CC: Primary care physician; Lawrence Sons, MD Patient ID: Lawrence [name], male   DOB: May 12, 1956, 65 y.o.   MRN: [id number]

## 2021-05-01 ENCOUNTER — Inpatient Hospital Stay: Payer: Medicare HMO

## 2021-05-01 ENCOUNTER — Encounter
Admission: EM | Disposition: A | Discharge status: Home / Self Care | Payer: Self-pay | Source: Home / Self Care | Attending: Family Medicine

## 2021-05-01 ENCOUNTER — Inpatient Hospital Stay: Payer: Medicare HMO | Admitting: Oncology

## 2021-05-01 ENCOUNTER — Encounter: Payer: Self-pay | Admitting: Family Medicine

## 2021-05-01 HISTORY — PX: LEFT HEART CATH AND CORS/GRAFTS ANGIOGRAPHY: CATH118250

## 2021-05-01 LAB — GLUCOSE, CAPILLARY
Glucose-Capillary: 177 mg/dL — ABNORMAL HIGH (ref 70–99)
Glucose-Capillary: 195 mg/dL — ABNORMAL HIGH (ref 70–99)
Glucose-Capillary: 164 mg/dL — ABNORMAL HIGH (ref 70–99)
Glucose-Capillary: 145 mg/dL — ABNORMAL HIGH (ref 70–99)
Glucose-Capillary: 113 mg/dL — ABNORMAL HIGH (ref 70–99)
Glucose-Capillary: 209 mg/dL — ABNORMAL HIGH (ref 70–99)

## 2021-05-01 SURGERY — LEFT HEART CATH AND CORS/GRAFTS ANGIOGRAPHY
Anesthesia: Moderate Sedation

## 2021-05-01 MED ORDER — HEPARIN (PORCINE) IN NACL 1000-0.9 UT/500ML-% IV SOLN
INTRAVENOUS | Status: DC | PRN
  Administered 2021-05-01: 1000 mL

## 2021-05-01 MED ORDER — MIDAZOLAM HCL 2 MG/2ML IJ SOLN
INTRAMUSCULAR | Status: AC
  Filled 2021-05-01: qty 2

## 2021-05-01 MED ORDER — FENTANYL CITRATE (PF) 100 MCG/2ML IJ SOLN
INTRAMUSCULAR | Status: DC | PRN
  Administered 2021-05-01: 50 ug via INTRAVENOUS
  Administered 2021-05-01 (×2): 25 ug via INTRAVENOUS

## 2021-05-01 MED ORDER — ONDANSETRON HCL 4 MG/2ML IJ SOLN: 4.0000 mg | Freq: Four times a day (QID) | INTRAMUSCULAR | Status: DC | PRN

## 2021-05-01 MED ORDER — FENTANYL CITRATE (PF) 100 MCG/2ML IJ SOLN
INTRAMUSCULAR | Status: AC
  Filled 2021-05-01: qty 2

## 2021-05-01 MED ORDER — IOHEXOL 300 MG/ML  SOLN
INTRAMUSCULAR | Status: DC | PRN
Start: 2021-05-01 — End: 2021-05-01
  Administered 2021-05-01: 84 mL

## 2021-05-01 MED ORDER — MIDAZOLAM HCL 2 MG/2ML IJ SOLN
INTRAMUSCULAR | Status: DC | PRN
  Administered 2021-05-01: 1 mg via INTRAVENOUS
  Administered 2021-05-01 (×2): 0.5 mg via INTRAVENOUS

## 2021-05-01 MED ORDER — ACETAMINOPHEN 325 MG PO TABS: 650.0000 mg | ORAL_TABLET | ORAL | Status: DC | PRN

## 2021-05-01 MED ORDER — LIDOCAINE HCL (PF) 1 % IJ SOLN
INTRAMUSCULAR | Status: DC | PRN
Start: 2021-05-01 — End: 2021-05-01
  Administered 2021-05-01: 20 mL

## 2021-05-01 MED ORDER — LABETALOL HCL 5 MG/ML IV SOLN: 10.0000 mg | INTRAVENOUS | Status: AC | PRN

## 2021-05-01 MED ORDER — HYDRALAZINE HCL 20 MG/ML IJ SOLN: 10.0000 mg | INTRAMUSCULAR | Status: AC | PRN

## 2021-05-01 MED ORDER — LIDOCAINE HCL (PF) 1 % IJ SOLN
INTRAMUSCULAR | Status: AC
  Filled 2021-05-01: qty 30

## 2021-05-01 MED ORDER — HEPARIN (PORCINE) IN NACL 1000-0.9 UT/500ML-% IV SOLN
INTRAVENOUS | Status: AC
  Filled 2021-05-01: qty 1000

## 2021-05-01 MED ORDER — SODIUM CHLORIDE 0.9 % WEIGHT BASED INFUSION
1.0000 mL/kg/h | INTRAVENOUS | Status: AC
  Administered 2021-05-02: 1 mL/kg/h via INTRAVENOUS

## 2021-05-01 SURGICAL SUPPLY — 10 items
CATH INFINITI 5FR JL4 (CATHETERS) ×3 IMPLANT
CATH INFINITI JR4 5F (CATHETERS) ×3 IMPLANT
DEVICE CLOSURE MYNXGRIP 5F (Vascular Products) ×3 IMPLANT
NEEDLE PERC 18GX7CM (NEEDLE) ×3 IMPLANT
PACK CARDIAC CATH (CUSTOM PROCEDURE TRAY) ×3 IMPLANT
PROTECTION STATION PRESSURIZED (MISCELLANEOUS) ×3
SET ATX SIMPLICITY (MISCELLANEOUS) ×3 IMPLANT
SHEATH AVANTI 5FR X 11CM (SHEATH) ×3 IMPLANT
STATION PROTECTION PRESSURIZED (MISCELLANEOUS) ×1 IMPLANT
WIRE GUIDERIGHT .035X150 (WIRE) ×6 IMPLANT

## 2021-05-01 NOTE — Care Management Important Message (Signed)
Important Message  Patient Details  Name: Lawrence [name] MRN: [id number] Date of Birth: [birth year]   Medicare Important Message Given:  Yes     Dannette Barbara 05/01/2021, 1:38 PM

## 2021-05-01 NOTE — Progress Notes (Signed)
[facility] [place] [facility] [place] NAME: Lawrence [name]    MR#:  [id number]  DATE OF BIRTH:  [birth year]  SUBJECTIVE:  Pt came in with CP. Denies any symptoms today. Awaiting cath today  REVIEW OF SYSTEMS:   Review of Systems  Constitutional:  Negative for chills, fever and weight loss.  HENT:  Negative for ear discharge, ear pain and nosebleeds.   Eyes:  Negative for blurred vision, pain and discharge.  Respiratory:  Negative for sputum production, shortness of breath, wheezing and stridor.   Cardiovascular:  Negative for chest pain, palpitations, orthopnea and PND.  Gastrointestinal:  Negative for abdominal pain, diarrhea, nausea and vomiting.  Genitourinary:  Negative for frequency and urgency.  Musculoskeletal:  Negative for back pain and joint pain.  Neurological:  Negative for sensory change, speech change, focal weakness and weakness.  Psychiatric/Behavioral:  Negative for depression and hallucinations. The patient is not nervous/anxious.   Tolerating Diet:yes Tolerating PT:   DRUG ALLERGIES:   Allergies  Allergen Reactions   Demerol  [Meperidine Hcl]    Demerol [Meperidine] Hives   Jardiance [Empagliflozin] Other (See Comments)    Perineal pain   Prednisone Other (See Comments) and Hypertension    Pt states that this medication puts him in A-fib    Sulfa Antibiotics Hives   Albuterol Sulfate [Albuterol] Palpitations and Other (See Comments)    Pt currently uses this medication.     Morphine Sulfate Nausea And Vomiting, Rash and Other (See Comments)    Pt states that he is only allergic to the tablet form of this medication.      VITALS:  Blood pressure (!) 143/83, pulse (!) 55, temperature 97.6 F (36.4 C), resp. rate 17, height 5' 7"  (1.702 m), weight 97.1 kg, SpO2 98 %.  PHYSICAL EXAMINATION:   Physical Exam  GENERAL:  66 y.o.-year-old patient lying in the bed with no acute distress.  LUNGS: Normal breath sounds bilaterally, no  wheezing, rales, rhonchi. No use of accessory muscles of respiration.  CARDIOVASCULAR: S1, S2 normal. No murmurs, rubs, or gallops.  ABDOMEN: Soft, nontender, nondistended. Bowel sounds present. No organomegaly or mass.  EXTREMITIES: No cyanosis, clubbing or edema b/l.    NEUROLOGIC: non focal PSYCHIATRIC:  patient is alert and oriented x 3.  SKIN: No obvious rash, lesion, or ulcer.   LABORATORY PANEL:  CBC Recent Labs  Lab 04/30/21 0539  WBC 7.1  HGB 17.0  HCT 51.6  PLT 175     Chemistries  Recent Labs  Lab 04/27/21 0326 04/28/21 0813 04/30/21 0539  NA 135   < > 137  K 2.8*   < > 3.9  CL 100   < > 95*  CO2 30   < > 37*  GLUCOSE 214*   < > 176*  BUN 10   < > 31*  CREATININE 0.75   < > 1.12  CALCIUM 8.8*   < > 9.3  MG 2.0  --   --   AST 16  --   --   ALT 12  --   --   ALKPHOS 63  --   --   BILITOT 1.1  --   --    < > = values in this interval not displayed.    Cardiac Enzymes No results for input(s): TROPONINI in the last 168 hours. RADIOLOGY:  No results found. ASSESSMENT AND PLAN:   Lawrence [name] is a 65 y.o. M with emphysema, COPD on chronic  oxygen, A. fib on Eliquis, CAD s/p CABG with chronic angina, HTN, MD CHF who presented with chest pain at rest.   Chest pain Coronary atherosclerosis History stable angina --Initially started on heparin and admitted for unstable angina.  Evaluated by Cardiology who felt this was stable, so heparin was stopped.  Recommended restarting home anti-anginals.   - Consult cardiology with Dr [name]-- cardiac cath plan for today - Continue amlodipine, Imdur, Ranexa - Continue aspirin, statin  Hypertension Blood pressure low normal - Continue lisinopril, amlodipine, Imdur  Paroxysmal atrial fibrillation Rate controlled, - Continue apixaban (onhold for cath) sotalol  COPD No wheezing - Continue ICS/LABA - Continue loratadine, PPI   Chronic diastolic CHF Appears euvolemic - Continue home torsemide  Insomnia -  Continue trazodone  Hypokalemia Mag, K repleted   Gout - on allopurinol  Mood disorder - on venlafaxine   Diabetes Glucoses controlled - Hold Farxiga, metformin - Continue sliding scale corrections        Procedures: Family communication :son Lawrence [name] 6/15 Consults : [facility] cardiology CODE STATUS: full DVT Prophylaxis : Level of care: Progressive Cardiac Status is: Inpatient  Remains inpatient appropriate because:Inpatient level of care appropriate due to severity of illness  Dispo: The patient is from: Home              Anticipated d/c is to: Home              Patient currently is not medically stable to d/c.   Difficult to place patient No   discharge pending cardiac cath     TOTAL TIME TAKING CARE OF THIS PATIENT: 30 minutes.  >50% time spent on counselling and coordination of care  Note: This dictation was prepared with Dragon dictation along with smaller phrase technology. Any transcriptional errors that result from this process are unintentional.  [name] M.D    [facility]   CC: Primary care physician; Lawrence Sons, MD Patient ID: Lawrence [name], male   DOB: [birth year], 65 y.o.   MRN: [id number]

## 2021-05-01 NOTE — Progress Notes (Signed)
[facility] Encounter Note  Patient: Lawrence [name] / Admit Date: 04/26/2021 / Date of Encounter: 05/01/2021, 5:30 PM   Subjective: Patient apparently had some substernal chest discomfort throughout yesterday occurring spontaneously as well as with activities but still appears non cardiac  His initial chest discomfort did not respond to nitroglycerin or other medication management other than morphine. And dilaudid  We have reviewed his cardiac catheterization from before and showed no evidence of particular potential for intervention.  Last cardiac catheterization showed no need for intervention and he is maximally medicated for his anginal symptoms.  It does appear clinically that this is not cardiac in nature.  The patient's troponin has been normal with no evidence of myocardial infarction and EKG is unchanged    Cardiac catheterization showing patent saphenous vein graft to obtuse marginal 1, patent right coronary artery with distal vessel disease unchanged from 2021, patent stent of left anterior descending artery, patent stent of proximal left circumflex artery All vessel disease unchanged from cardiac catheterization in 2021 and small vessels not amenable to PCI and stent placement and best served by medical management  Review of Systems: Positive for: Chest pain Negative for: Vision change, hearing change, syncope, dizziness, nausea, vomiting,diarrhea, bloody stool, stomach pain, cough, congestion, diaphoresis, urinary frequency, urinary pain,skin lesions, skin rashes Others previously listed  Objective: Telemetry: Normal sinus rhythm Physical Exam: Blood pressure (!) 156/86, pulse 68, temperature 97.9 F (36.6 C), temperature source Oral, resp. rate 10, height 5' 7"  (1.702 m), weight 97.1 kg, SpO2 93 %. Body mass index is 33.53 kg/m. General: Well developed, well nourished, in no acute distress. Head: Normocephalic, atraumatic, sclera non-icteric, no xanthomas,  nares are without discharge. Neck: No apparent masses Lungs: Normal respirations with no wheezes, no rhonchi, no rales , no crackles   Heart: Regular rate and rhythm, normal S1 S2, no murmur, no rub, no gallop, PMI is normal size and placement, carotid upstroke normal without bruit, jugular venous pressure normal Abdomen: Soft, non-tender, non-distended with normoactive bowel sounds. No hepatosplenomegaly. Abdominal aorta is normal size without bruit Extremities: No edema, no clubbing, no cyanosis, no ulcers,  Peripheral: 2+ radial, 2+ femoral, 2+ dorsal pedal pulses Neuro: Alert and oriented. Moves all extremities spontaneously. Psych:  Responds to questions appropriately with a normal affect.   Intake/Output Summary (Last 24 hours) at 05/01/2021 1730 Last data filed at 05/01/2021 0950 Gross per 24 hour  Intake 240 ml  Output 950 ml  Net -710 ml     Inpatient Medications:   [MAR Hold] allopurinol  300 mg Oral BID   [MAR Hold] amLODipine  2.5 mg Oral Daily   [MAR Hold] aspirin  81 mg Oral Daily   [MAR Hold] atorvastatin  80 mg Oral QHS   [MAR Hold] fluticasone furoate-vilanterol  1 puff Inhalation Daily   [MAR Hold] insulin aspart  0-9 Units Subcutaneous Q4H   [MAR Hold] isosorbide mononitrate  120 mg Oral Daily   [MAR Hold] lisinopril  10 mg Oral Daily   [MAR Hold] loratadine  10 mg Oral Daily   [MAR Hold] pantoprazole  40 mg Oral Q1200   [MAR Hold] ranolazine  1,000 mg Oral BID   [MAR Hold] sodium chloride flush  3 mL Intravenous Q12H   [MAR Hold] sodium chloride flush  3 mL Intravenous Q12H   [MAR Hold] sotalol  40 mg Oral BID   [MAR Hold] torsemide  50 mg Oral Daily   [MAR Hold] traZODone  150 mg Oral QHS   [  MAR Hold] venlafaxine XR  75 mg Oral Q breakfast   Infusions:   [MAR Hold] sodium chloride     sodium chloride     sodium chloride 1 mL/kg/hr (05/01/21 0439)    Labs: Recent Labs    04/30/21 0539  NA 137  K 3.9  CL 95*  CO2 37*  GLUCOSE 176*  BUN 31*   CREATININE 1.12  CALCIUM 9.3    No results for input(s): AST, ALT, ALKPHOS, BILITOT, PROT, ALBUMIN in the last 72 hours.  Recent Labs    04/30/21 0539  WBC 7.1  HGB 17.0  HCT 51.6  MCV 95.9  PLT 175    No results for input(s): CKTOTAL, CKMB, TROPONINI in the last 72 hours. Invalid input(s): POCBNP No results for input(s): HGBA1C in the last 72 hours.    Weights: Filed Weights   04/29/21 1015 04/30/21 0652 05/01/21 1402  Weight: 97.6 kg 97.1 kg 97.1 kg     Radiology/Studies:  DG Chest Portable 1 View  Result Date: 04/26/2021 CLINICAL DATA:  Chest pain EXAM: PORTABLE CHEST 1 VIEW COMPARISON:  12/10/2020 FINDINGS: Prior CABG. Heart and mediastinal contours are within normal limits. Scarring in the lingula. Right lung clear. No effusions. Heart is normal size. No acute bony abnormality. IMPRESSION: No active disease. Electronically Signed   By: Rolm Baptise M.D.   On: 04/26/2021 22:35   ECHOCARDIOGRAM COMPLETE  Result Date: 04/28/2021    ECHOCARDIOGRAM REPORT   Patient Name:   Lawrence [name] [facility] Date of Exam: 04/27/2021 Medical Rec #:  [id number]       Height:       67.0 in Accession #:    [id number]      Weight:       220.0 lb Date of Birth:  10-01-1956       BSA:          2.106 m Patient Age:    65 years        BP:           119/80 mmHg Patient Gender: M               HR:           116 bpm. Exam Location:  [facility] Procedure: 2D Echo and Intracardiac Opacification Agent Indications:     Chest Pain R07.9  History:         Patient has no prior history of Echocardiogram examinations.  Sonographer:     Kathlen Brunswick RDCS Referring Phys:  [place] Diagnosing Phys: [name] MD  Sonographer Comments: Technically difficult study due to poor echo windows. Image acquisition challenging due to respiratory motion. IMPRESSIONS  1. Left ventricular ejection fraction, by estimation, is 45 to 50%. The left ventricle has mildly decreased function. The left ventricle has no regional  wall motion abnormalities. Left ventricular diastolic parameters were normal.  2. Right ventricular systolic function is normal. The right ventricular size is normal.  3. The mitral valve is normal in structure. Trivial mitral valve regurgitation.  4. The aortic valve is normal in structure. Aortic valve regurgitation is trivial. FINDINGS  Left Ventricle: Left ventricular ejection fraction, by estimation, is 45 to 50%. The left ventricle has mildly decreased function. The left ventricle has no regional wall motion abnormalities. Definity contrast agent was given IV to delineate the left ventricular endocardial borders. The left ventricular internal cavity size was normal in size. There is no left ventricular hypertrophy. Left ventricular diastolic parameters were normal. Right  Ventricle: The right ventricular size is normal. No increase in right ventricular wall thickness. Right ventricular systolic function is normal. Left Atrium: Left atrial size was normal in size. Right Atrium: Right atrial size was normal in size. Pericardium: There is no evidence of pericardial effusion. Mitral Valve: The mitral valve is normal in structure. Trivial mitral valve regurgitation. Tricuspid Valve: The tricuspid valve is normal in structure. Tricuspid valve regurgitation is trivial. Aortic Valve: The aortic valve is normal in structure. Aortic valve regurgitation is trivial. Aortic valve peak gradient measures 8.0 mmHg. Pulmonic Valve: The pulmonic valve was normal in structure. Pulmonic valve regurgitation is not visualized. Aorta: The aortic root and ascending aorta are structurally normal, with no evidence of dilitation. IAS/Shunts: No atrial level shunt detected by color flow Doppler.  LEFT VENTRICLE PLAX 2D LVIDd:         5.37 cm  Diastology LVIDs:         3.56 cm  LV e' medial:    10.40 cm/s LV PW:         1.39 cm  LV E/e' medial:  5.4 LV IVS:        1.21 cm  LV e' lateral:   12.40 cm/s LVOT diam:     1.90 cm  LV E/e'  lateral: 4.5 LV SV:         53 LV SV Index:   25 LVOT Area:     2.84 cm  RIGHT VENTRICLE RV Basal diam:  3.26 cm RV S prime:     8.16 cm/s TAPSE (M-mode): 1.6 cm LEFT ATRIUM             Index LA diam:        3.70 cm 1.76 cm/m LA Vol (A2C):   40.9 ml 19.42 ml/m LA Vol (A4C):   47.6 ml 22.60 ml/m LA Biplane Vol: 46.1 ml 21.89 ml/m  AORTIC VALVE                PULMONIC VALVE AV Area (Vmax): 2.27 cm    PV Vmax:       0.77 m/s AV Vmax:        141.00 cm/s PV Peak grad:  2.4 mmHg AV Peak Grad:   8.0 mmHg LVOT Vmax:      113.00 cm/s LVOT Vmean:     77.000 cm/s LVOT VTI:       0.186 m  AORTA Ao Root diam: 3.10 cm Ao Asc diam:  3.60 cm MITRAL VALVE MV Area (PHT): 4.68 cm    SHUNTS MV Decel Time: 162 msec    Systemic VTI:  0.19 m MV E velocity: 56.10 cm/s  Systemic Diam: 1.90 cm [name] MD Electronically signed by [name] MD Signature Date/Time: 04/28/2021/8:29:52 AM    Final      Assessment and Recommendation  65 y.o. male with known coronary artery disease status post previous infarct PCI and stent placement in left circumflex and left anterior descending artery with atypical chest discomfort only relieved by morphine and no current evidence of acute coronary syndrome and not consistant with true cardiac angina with cardiac catheterization showing patent graft and stents to left anterior descending artery and left circumflex artery with no change in right coronary artery atherosclerosis from 2021 1.  Continue current medical regimen which is high intensity cholesterol therapy and multiple antianginals as well for her current symptoms of chest discomfort.  Ambulate and follow for improvements of symptoms.  If ambulating well okay for discharge to home  with follow-up at a later date.    2.  Reinstatement of anticoagulation for atrial fibrillation upon discharge to home 3.  Furosemide for lower extremity edema previous LV systolic dysfunction 4.  High intensity cholesterol therapy 5.  No further  cardiac diagnostics necessary at this time and if ambulating tomorrow okay for discharged home with follow-up in 1 week  Signed, [name] M.D. FACC

## 2021-05-02 ENCOUNTER — Encounter: Payer: Self-pay | Admitting: Internal Medicine

## 2021-05-02 LAB — GLUCOSE, CAPILLARY
Glucose-Capillary: 111 mg/dL — ABNORMAL HIGH (ref 70–99)
Glucose-Capillary: 146 mg/dL — ABNORMAL HIGH (ref 70–99)
Glucose-Capillary: 167 mg/dL — ABNORMAL HIGH (ref 70–99)
Glucose-Capillary: 202 mg/dL — ABNORMAL HIGH (ref 70–99)

## 2021-05-02 MED ORDER — APIXABAN 5 MG PO TABS
5.0000 mg | ORAL_TABLET | Freq: Once | ORAL | Status: AC
  Administered 2021-05-02: 5 mg via ORAL
  Filled 2021-05-02: qty 1

## 2021-05-02 MED ORDER — METFORMIN HCL ER 500 MG PO TB24: 500.0000 mg | ORAL_TABLET | Freq: Two times a day (BID) | ORAL | 0 refills | Status: DC

## 2021-05-02 NOTE — Discharge Summary (Signed)
[place] at Lawrence [name]: Lawrence [name]    Lawrence#:  [id number]  DATE OF BIRTH:  Jan 26, 1956  DATE OF ADMISSION:  04/26/2021 ADMITTING PHYSICIAN: Lawrence Dada, MD  DATE OF DISCHARGE 05/02/2021  PRIMARY CARE PHYSICIAN: Lawrence Sons, MD    ADMISSION DIAGNOSIS:  Chronic diastolic heart failure (Lawrence [name]) [I50.32] Unstable angina (Lawrence [name]) [I20.0] Uncontrolled hypertension [I10] Chest pain [R07.9] Atrial fibrillation, unspecified type (Lawrence [name]) [I48.91] Chronic obstructive pulmonary disease, unspecified COPD type (Lawrence [name]) [J44.9]  DISCHARGE DIAGNOSIS:  Chest pain with h/o stable angina coronary artery disease atrial fibrillation on chronic anticoagulation chronic respiratory failure secondary to COPD on chronic oxygen  SECONDARY DIAGNOSIS:   Past Medical History:  Diagnosis Date   A-fib (Lawrence [name])    Anemia    Anginal pain (Lawrence [name])    Anxiety    Arthritis    Asthma    CAD (coronary artery disease)    a. 2002 CABGx2 (LIMA->LAD, VG->VG->OM1);  b. 09/2012 DES->OM;  c. 03/2015 PTCA of LAD [facility]) in setting of atretic LIMA; d. 05/2015 Cath [name]): nonobs dzs; e. 06/2015 Cath ([facility]): LM nl, LAD 45p/d ISR, 50d, D1/2 small, LCX 50p/d ISR, OM1 70ost, 30 ISR, VG->OM1 50ost, 35m LIMA->LAD 99p/d - atretic, RCA dom, nl; f.cath 10/16: 40-50%(FFR 0.90) pLAD, 75% (FFR 0.77) mLAD s/p PCI/DES, oRCA 40% (FFR0.95)   Cancer (Lawrence [name])    SKIN CANCER ON BACK   Celiac disease    Chronic diastolic CHF (congestive heart failure) ([name]    a. 06/2009 Echo: EF 60-65%, Gr 1 DD, triv AI, mildly dil LA, nl RV.   COPD (chronic obstructive pulmonary disease) ([facility]    a. Chronic bronchitis and emphysema.   DDD (degenerative disc disease), lumbar    Diverticulosis    Dysrhythmia    Essential hypertension    GERD (gastroesophageal reflux disease)    History of hiatal hernia    History of kidney stones    H/O   History of tobacco abuse    a. Quit 2014.   Myocardial infarction (Lawrence [name]  2002   4 STENTS   Pancreatitis    PSVT (paroxysmal supraventricular tachycardia) (Lawrence [name]    a. 10/2012 Noted on [facility].   Sleep apnea    LOST CORD TO CPAP -ONLY 02 @ BEDTIME   Tubular adenoma of colon    Type II diabetes mellitus ([name]     HOSPITAL COURSE:   Lawrence. [name] a 65y.o. M with emphysema, COPD on chronic oxygen, A. fib on Eliquis, CAD s/p CABG with chronic angina, HTN, MD CHF who presented with chest pain at rest.    Chest pain Coronary atherosclerosis History stable angina --Initially started on heparin and admitted for unstable angina.  Evaluated by Cardiology who felt this was stable, so heparin was stopped.  Recommended restarting home anti-anginals.   - Consult cardiology with Dr KNehemiah Massed--[facility] note "Cardiac catheterization showing patent saphenous vein graft to obtuse marginal 1, patent right coronary artery with distal vessel disease unchanged from 2021, patent stent of left anterior descending artery, patent stent of proximal left circumflex artery All vessel disease unchanged from cardiac catheterization in 2021 and small vessels not amenable to PCI and stent placement and best served by medical management" - Continue amlodipine, Imdur, Ranexa - Continue aspirin, statin, prn nitro --Cardiac rehab  Hypertension - Continue lisinopril, amlodipine, Imdur  Paroxysmal atrial fibrillation Rate controlled, - Continue apixaban, sotalol  COPD Chronic home oxygen No wheezing - Continue ICS/LABA - Continue  loratadine, PPI   Chronic diastolic CHF Appears euvolemic - Continue home torsemide  Insomnia - Continue trazodone  Hypokalemia Mag, K repleted   Gout - on allopurinol  Mood disorder - on venlafaxine   Diabetes-2, hyperglycemia with atherosclerotic vascular disease Glucoses controlled - Resumed Farxiga, metformin (restart after 48 hours) - Continue sliding scale corrections  Chornic narcotic use  --I have not given any rx for  narcotics--pt is advised to f/u PCP  overall hemodynamically stable.     Procedures:cath  Family communication :son Lawrence [name]  Consults : [name] cardiology CODE STATUS: full DVT Prophylaxis : Level of care: Progressive Cardiac Status is: Inpatient     Dispo: The patient is from: Home              Anticipated d/c is to: Home              Patient currently is medically optimized for d/c.              Difficult to place patient No      CONSULTS OBTAINED:    DRUG ALLERGIES:   Allergies  Allergen Reactions   Demerol  [Meperidine Hcl]    Demerol [Meperidine] Hives   Jardiance [Empagliflozin] Other (See Comments)    Perineal pain   Prednisone Other (See Comments) and Hypertension    Pt states that this medication puts him in A-fib    Sulfa Antibiotics Hives   Albuterol Sulfate [Albuterol] Palpitations and Other (See Comments)    Pt currently uses this medication.     Morphine Sulfate Nausea And Vomiting, Rash and Other (See Comments)    Pt states that he is only allergic to the tablet form of this medication.      DISCHARGE MEDICATIONS:   Allergies as of 05/02/2021       Reactions   Demerol  [meperidine Hcl]    Demerol [meperidine] Hives   Jardiance [empagliflozin] Other (See Comments)   Perineal pain   Prednisone Other (See Comments), Hypertension   Pt states that this medication puts him in A-fib    Sulfa Antibiotics Hives   Albuterol Sulfate [albuterol] Palpitations, Other (See Comments)   Pt currently uses this medication.     Morphine Sulfate Nausea And Vomiting, Rash, Other (See Comments)   Pt states that he is only allergic to the tablet form of this medication.          Medication List     STOP taking these medications    magnesium oxide 400 (241.3 Mg) MG tablet Commonly known as: MAG-OX   promethazine 25 MG tablet Commonly known as: PHENERGAN       TAKE these medications    Accu-Chek Guide Control Liqd Use with blood glucose monitor as  directed   Accu-Chek Guide test strip Generic drug: glucose blood Use as instructed to check sugar daily for type 2 diabetes.   Accu-Chek Guide w/Device Kit Use to check blood sugars as directed   Accu-Chek Softclix Lancets lancets Use as instructed to check sugar daily for type 2 diabetes.   allopurinol 300 MG tablet Commonly known as: ZYLOPRIM TAKE 1 TABLET BY MOUTH TWICE A DAY   ALPRAZolam 1 MG tablet Commonly known as: XANAX Take 1 tablet (1 mg total) by mouth 3 (three) times daily.   amLODipine 2.5 MG tablet Commonly known as: NORVASC Take 1 tablet (2.5 mg total) by mouth daily.   aspirin 81 MG chewable tablet Chew 81 mg by mouth daily. Swallows whole  atorvastatin 80 MG tablet Commonly known as: LIPITOR TAKE ONE TABLET BY MOUTH AT BEDTIME What changed: when to take this   Breo Ellipta 100-25 MCG/INH Aepb Generic drug: fluticasone furoate-vilanterol INHALE 1 PUFF INTO THE LUNGS DAILY What changed: when to take this   cetirizine 10 MG tablet Commonly known as: ZYRTEC TAKE ONE TABLET BY MOUTH AT BEDTIME What changed:  when to take this reasons to take this   docusate sodium 100 MG capsule Commonly known as: COLACE Take 100 mg by mouth daily as needed for mild constipation.   Eliquis 5 MG Tabs tablet Generic drug: apixaban TAKE 1 TABLET BY MOUTH TWICE A DAY What changed: how much to take   Farxiga 10 MG Tabs tablet Generic drug: dapagliflozin propanediol TAKE 1 TABLET BY MOUTH DAILY What changed: how much to take   isosorbide mononitrate 120 MG 24 hr tablet Commonly known as: IMDUR TAKE 1 TABLET BY MOUTH DAILY   linaclotide 72 MCG capsule Commonly known as: LINZESS Take 72 mcg by mouth daily before breakfast.   lisinopril 10 MG tablet Commonly known as: ZESTRIL TAKE 1 TABLET BY MOUTH DAILY   metFORMIN 500 MG 24 hr tablet Commonly known as: GLUCOPHAGE-XR Take 1 tablet (500 mg total) by mouth 2 (two) times daily. Start taking on: May 04, 2021 What changed: These instructions start on May 04, 2021. If you are unsure what to do until then, ask your doctor or other care provider.   methocarbamol 500 MG tablet Commonly known as: Robaxin Take 1 tablet (500 mg total) by mouth every 8 (eight) hours as needed for muscle spasms.   naloxone 4 MG/0.1ML Liqd nasal spray kit Commonly known as: NARCAN 1 spray into nostril x1 and may repeat every 2-3 minutes until patient is responsive or EMS arrives   nitroGLYCERIN 0.4 MG SL tablet Commonly known as: NITROSTAT PLACE ONE TABLET UNDER THE TONGUE EVERY 5 MINUTES FOR CHEST PAIN. IF NO RELIEF PAST 3RD TAB GO TO EMERGENCY ROOM What changed: See the new instructions.   omega-3 acid ethyl esters 1 g capsule Commonly known as: LOVAZA TAKE 4 CAPSULES BY MOUTH DAILY   omeprazole 40 MG capsule Commonly known as: PRILOSEC Take 40 mg by mouth 2 (two) times daily.   oxyCODONE-acetaminophen 10-325 MG tablet Commonly known as: PERCOCET TAKE 1 TABLET BY MOUTH EVERY 4 HOURS AS NEEDED FOR PAIN What changed:  reasons to take this additional instructions   ranolazine 1000 MG SR tablet Commonly known as: RANEXA Take 1 tablet (1,000 mg total) by mouth 2 (two) times daily.   sotalol 80 MG tablet Commonly known as: BETAPACE Take 40 mg by mouth 2 (two) times daily.   Spiriva HandiHaler 18 MCG inhalation capsule Generic drug: tiotropium Place 1 capsule (18 mcg total) into inhaler and inhale daily. What changed: See the new instructions.   torsemide 100 MG tablet Commonly known as: DEMADEX Take 0.5 tablets (50 mg total) by mouth daily.   traZODone 150 MG tablet Commonly known as: DESYREL TAKE 1 TABLET BY MOUTH AT BEDTIME   venlafaxine XR 75 MG 24 hr capsule Commonly known as: EFFEXOR-XR TAKE 1 CAPSULE BY MOUTH DAILY WITH BREAKFAST        If you experience worsening of your admission symptoms, develop shortness of breath, life threatening emergency, suicidal or homicidal thoughts  you must seek medical attention immediately by calling 911 or calling your MD immediately  if symptoms less severe.  You Must read complete instructions/literature along with all the  possible adverse reactions/side effects for all the Medicines you take and that have been prescribed to you. Take any new Medicines after you have completely understood and accept all the possible adverse reactions/side effects.   Please note  You were cared for by a hospitalist during your hospital stay. If you have any questions about your discharge medications or the care you received while you were in the hospital after you are discharged, you can call the unit and asked to speak with the hospitalist on call if the hospitalist that took care of you is not available. Once you are discharged, your primary care physician will handle any further medical issues. Please note that NO REFILLS for any discharge medications will be authorized once you are discharged, as it is imperative that you return to your primary care physician (or establish a relationship with a primary care physician if you do not have one) for your aftercare needs so that they can reassess your need for medications and monitor your lab values. Today   SUBJECTIVE   No new complaints  VITAL SIGNS:  Blood pressure (!) 145/97, pulse (!) 52, temperature 97.7 F (36.5 C), temperature source Oral, resp. rate 17, height 5' 7"  (1.702 m), weight 97.1 kg, SpO2 95 %.  I/O:   Intake/Output Summary (Last 24 hours) at 05/02/2021 0816 Last data filed at 05/02/2021 0433 Gross per 24 hour  Intake 1240 ml  Output 1200 ml  Net 40 ml    PHYSICAL EXAMINATION:  GENERAL:  65 y.o.-year-old patient lying in the bed with no acute distress.   LUNGS: Normal breath sounds bilaterally, no wheezing, rales,rhonchi or crepitation. No use of accessory muscles of respiration.  CARDIOVASCULAR: S1, S2 normal. No murmurs, rubs, or gallops.  ABDOMEN: Soft, non-tender,  non-distended. Bowel sounds present. No organomegaly or mass.  EXTREMITIES: No pedal edema, cyanosis, or clubbing.  NEUROLOGIC: Cranial nerves II through XII are intact. Muscle strength 5/5 in all extremities. Sensation intact. Gait not checked.  PSYCHIATRIC: The patient is alert and oriented x 3.  SKIN: No obvious rash, lesion, or ulcer.   DATA REVIEW:   CBC  Recent Labs  Lab 04/30/21 0539  WBC 7.1  HGB 17.0  HCT 51.6  PLT 175    Chemistries  Recent Labs  Lab 04/27/21 0326 04/28/21 0813 04/30/21 0539  NA 135   < > 137  K 2.8*   < > 3.9  CL 100   < > 95*  CO2 30   < > 37*  GLUCOSE 214*   < > 176*  BUN 10   < > 31*  CREATININE 0.75   < > 1.12  CALCIUM 8.8*   < > 9.3  MG 2.0  --   --   AST 16  --   --   ALT 12  --   --   ALKPHOS 63  --   --   BILITOT 1.1  --   --    < > = values in this interval not displayed.    Microbiology Results   Recent Results (from the past 240 hour(s))  Resp Panel by RT-PCR (Flu A&B, Covid) Nasopharyngeal Swab     Status: None   Collection Time: 04/26/21 10:13 PM   Specimen: Nasopharyngeal Swab; Nasopharyngeal(NP) swabs in vial transport medium  Result Value Ref Range Status   SARS Coronavirus 2 by RT PCR NEGATIVE NEGATIVE Final    Comment: (NOTE) SARS-CoV-2 target nucleic acids are NOT DETECTED.  The SARS-CoV-2 RNA is  generally detectable in upper respiratory specimens during the acute phase of infection. The lowest concentration of SARS-CoV-2 viral copies this assay can detect is 138 copies/mL. A negative result does not preclude SARS-Cov-2 infection and should not be used as the sole basis for treatment or other patient management decisions. A negative result may occur with  improper specimen collection/handling, submission of specimen other than nasopharyngeal swab, presence of viral mutation(s) within the areas targeted by this assay, and inadequate number of viral copies(<138 copies/mL). A negative result must be combined  with clinical observations, patient history, and epidemiological information. The expected result is Negative.  Fact Sheet for Patients:  EntrepreneurPulse.com.au  Fact Sheet for Healthcare Providers:  IncredibleEmployment.be  This test is no t yet approved or cleared by the [place] FDA and  has been authorized for detection and/or diagnosis of SARS-CoV-2 by FDA under an Emergency Use Authorization (EUA). This EUA will remain  in effect (meaning this test can be used) for the duration of the COVID-19 declaration under Section 564(b)(1) of the Act, 21 U.S.C.section 360bbb-3(b)(1), unless the authorization is terminated  or revoked sooner.       Influenza A by PCR NEGATIVE NEGATIVE Final   Influenza B by PCR NEGATIVE NEGATIVE Final    Comment: (NOTE) The Xpert Xpress SARS-CoV-2/FLU/RSV plus assay is intended as an aid in the diagnosis of influenza from Nasopharyngeal swab specimens and should not be used as a sole basis for treatment. Nasal washings and aspirates are unacceptable for Xpert Xpress SARS-CoV-2/FLU/RSV testing.  Fact Sheet for Patients: EntrepreneurPulse.com.au  Fact Sheet for Healthcare Providers: IncredibleEmployment.be  This test is not yet approved or cleared by the [place] FDA and has been authorized for detection and/or diagnosis of SARS-CoV-2 by FDA under an Emergency Use Authorization (EUA). This EUA will remain in effect (meaning this test can be used) for the duration of the COVID-19 declaration under Section 564(b)(1) of the Act, 21 U.S.C. section 360bbb-3(b)(1), unless the authorization is terminated or revoked.  Performed at [facility], [address] [place], [place] [address]     RADIOLOGY:  CARDIAC CATHETERIZATION  Result Date: 05/01/2021  Prox LAD to Mid LAD lesion is 30% stenosed.  Mid LAD lesion is 40% stenosed.  Non-stenotic Mid LAD to  Dist LAD lesion was previously treated.  1st Mrg lesion is 70% stenosed.  Non-stenotic Prox Cx to Mid Cx lesion was previously treated.  Prox RCA lesion is 15% stenosed.  Dist RCA lesion is 25% stenosed.  Non-stenotic Origin to Prox Graft lesion was previously treated.  LIMA and is small.  RPDA-1 lesion is 90% stenosed.  RPDA-2 lesion is 85% stenosed.  RPAV-1 lesion is 85% stenosed with 85% stenosed side branch in 1st RPL.  RPAV-2 lesion is 60% stenosed.  65 year old male with known cardiovascular disease hypertension hyperlipidemia status post previous coronary to bypass graft and multiple stents in multiple arteries with atypical chest pain and no evidence of myocardial infarction Left ventricle shows normal LV systolic function with ejection fraction of 55% by echocardiogram Patient had patent saphenous vein graft to obtuse marginal 1 Atretic LIMA to LAD not injected Patent stent of proximal and mid LAD unchanged from 2021 Patent stent of proximal circumflex unchanged from 2021 Patent right coronary artery with distal PDA and PL stenosis diffuse and not amenable to PCI and stent placement and/or intervention Plan Continue maximal medication management for anginal symptoms including isosorbide beta-blocker calcium channel blocker and Ranexa High intensity cholesterol therapy Cardiac rehabilitation No further  cardiac intervention at this time     CODE STATUS:     Code Status Orders  (From admission, onward)           Start     Ordered   04/27/21 0129  Full code  Continuous        04/27/21 0128           Code Status History     Date Active Date Inactive Code Status Order ID Comments User Context   12/11/2020 1054 12/12/2020 1844 Full Code [id number]  Ivor Costa, MD ED   10/06/2020 2250 10/08/2020 1605 Full Code [id number]  Caren Griffins, MD Inpatient   09/27/2020 0003 09/28/2020 2207 Full Code [id number]  Val Riles, MD Inpatient   03/07/2020 1704 03/11/2020 2224 Full Code  [id number]  Ivor Costa, MD ED   09/09/2019 0005 09/10/2019 1709 Full Code [id number]  Mansy, Arvella Merles, MD ED   06/11/2019 0001 06/12/2019 1914 Full Code [id number]  Lance Coon, MD Inpatient   12/14/2018 2323 12/16/2018 1505 Full Code [id number]  [name], MD Inpatient   04/18/2018 1546 04/19/2018 1831 Full Code [id number]  Saundra Shelling, MD Inpatient   10/01/2017 0053 10/01/2017 1758 Full Code [id number]  Lance Coon, MD Inpatient   10/29/2016 2222 11/03/2016 1618 Full Code [id number]  Lance Coon, MD Inpatient   09/03/2016 0143 09/03/2016 1846 Full Code [id number]  Lance Coon, MD ED   06/25/2016 0036 06/26/2016 1535 Full Code [id number]  Lance Coon, MD ED   04/03/2016 2055 04/07/2016 1759 Full Code [id number]  Theodoro Grist, MD Inpatient   03/26/2016 2224 03/28/2016 1800 Full Code [id number]  Lance Coon, MD Inpatient   10/07/2015 0936 10/07/2015 1830 Full Code [id number]  Dionisio David, MD Inpatient   10/07/2015 0826 10/07/2015 0936 Full Code [id number]  Dionisio David, MD Inpatient   10/05/2015 1929 10/07/2015 0826 Full Code [id number]  Idelle Crouch, MD Inpatient   09/12/2015 2129 09/14/2015 1425 Full Code [id number]  Demetrios Loll, MD Inpatient   08/01/2015 0521 08/02/2015 1346 Full Code [id number]  Lance Coon, MD Inpatient   07/12/2015 1524 07/13/2015 2017 Full Code [id number]  Belva Crome, MD Inpatient   07/11/2015 2000 07/12/2015 1524 Full Code [id number]  Reola Mosher Inpatient   06/26/2015 0543 06/27/2015 1530 Full Code [id number]  Juluis Mire, MD Inpatient        TOTAL TIME TAKING CARE OF THIS PATIENT: 40 minutes.    [name] M.D  [facility]    CC: Primary care physician; Lawrence Sons, MD

## 2021-05-02 NOTE — Discharge Instructions (Signed)
Cardiology recommends cardiac rehab as outpatient

## 2021-05-02 NOTE — Discharge Planning (Signed)
[name] to be D/C'd Home per MD order.  Discussed prescriptions and follow up appointments with the patient. NO prescriptions given to patient, medication list explained in detail. Pt verbalized understanding. Mynx closure device pamphlet given. Care for cath site also discussed and understood Allergies as of 05/02/2021       Reactions   Demerol  [meperidine Hcl]    Demerol [meperidine] Hives   Jardiance [empagliflozin] Other (See Comments)   Perineal pain   Prednisone Other (See Comments), Hypertension   Pt states that this medication puts him in A-fib    Sulfa Antibiotics Hives   Albuterol Sulfate [albuterol] Palpitations, Other (See Comments)   Pt currently uses this medication.     Morphine Sulfate Nausea And Vomiting, Rash, Other (See Comments)   Pt states that he is only allergic to the tablet form of this medication.          Medication List     STOP taking these medications    magnesium oxide 400 (241.3 Mg) MG tablet Commonly known as: MAG-OX   promethazine 25 MG tablet Commonly known as: PHENERGAN       TAKE these medications    Accu-Chek Guide Control Liqd Use with blood glucose monitor as directed   Accu-Chek Guide test strip Generic drug: glucose blood Use as instructed to check sugar daily for type 2 diabetes.   Accu-Chek Guide w/Device Kit Use to check blood sugars as directed   Accu-Chek Softclix Lancets lancets Use as instructed to check sugar daily for type 2 diabetes.   allopurinol 300 MG tablet Commonly known as: ZYLOPRIM TAKE 1 TABLET BY MOUTH TWICE A DAY   ALPRAZolam 1 MG tablet Commonly known as: XANAX Take 1 tablet (1 mg total) by mouth 3 (three) times daily.   amLODipine 2.5 MG tablet Commonly known as: NORVASC Take 1 tablet (2.5 mg total) by mouth daily.   aspirin 81 MG chewable tablet Chew 81 mg by mouth daily. Swallows whole   atorvastatin 80 MG tablet Commonly known as: LIPITOR TAKE ONE TABLET BY MOUTH AT  BEDTIME What changed: when to take this   Breo Ellipta 100-25 MCG/INH Aepb Generic drug: fluticasone furoate-vilanterol INHALE 1 PUFF INTO THE LUNGS DAILY What changed: when to take this   cetirizine 10 MG tablet Commonly known as: ZYRTEC TAKE ONE TABLET BY MOUTH AT BEDTIME What changed:  when to take this reasons to take this   docusate sodium 100 MG capsule Commonly known as: COLACE Take 100 mg by mouth daily as needed for mild constipation.   Eliquis 5 MG Tabs tablet Generic drug: apixaban TAKE 1 TABLET BY MOUTH TWICE A DAY What changed: how much to take   Farxiga 10 MG Tabs tablet Generic drug: dapagliflozin propanediol TAKE 1 TABLET BY MOUTH DAILY What changed: how much to take   isosorbide mononitrate 120 MG 24 hr tablet Commonly known as: IMDUR TAKE 1 TABLET BY MOUTH DAILY   linaclotide 72 MCG capsule Commonly known as: LINZESS Take 72 mcg by mouth daily before breakfast.   lisinopril 10 MG tablet Commonly known as: ZESTRIL TAKE 1 TABLET BY MOUTH DAILY   metFORMIN 500 MG 24 hr tablet Commonly known as: GLUCOPHAGE-XR Take 1 tablet (500 mg total) by mouth 2 (two) times daily. Start taking on: May 04, 2021 What changed: These instructions start on May 04, 2021. If you are unsure what to do until then, ask your doctor or other care provider.   methocarbamol 500 MG tablet Commonly  known as: Robaxin Take 1 tablet (500 mg total) by mouth every 8 (eight) hours as needed for muscle spasms.   naloxone 4 MG/0.1ML Liqd nasal spray kit Commonly known as: NARCAN 1 spray into nostril x1 and may repeat every 2-3 minutes until patient is responsive or EMS arrives   nitroGLYCERIN 0.4 MG SL tablet Commonly known as: NITROSTAT PLACE ONE TABLET UNDER THE TONGUE EVERY 5 MINUTES FOR CHEST PAIN. IF NO RELIEF PAST 3RD TAB GO TO EMERGENCY ROOM What changed: See the new instructions.   omega-3 acid ethyl esters 1 g capsule Commonly known as: LOVAZA TAKE 4 CAPSULES BY MOUTH  DAILY   omeprazole 40 MG capsule Commonly known as: PRILOSEC Take 40 mg by mouth 2 (two) times daily.   oxyCODONE-acetaminophen 10-325 MG tablet Commonly known as: PERCOCET TAKE 1 TABLET BY MOUTH EVERY 4 HOURS AS NEEDED FOR PAIN What changed:  reasons to take this additional instructions   ranolazine 1000 MG SR tablet Commonly known as: RANEXA Take 1 tablet (1,000 mg total) by mouth 2 (two) times daily.   sotalol 80 MG tablet Commonly known as: BETAPACE Take 40 mg by mouth 2 (two) times daily.   Spiriva HandiHaler 18 MCG inhalation capsule Generic drug: tiotropium Place 1 capsule (18 mcg total) into inhaler and inhale daily. What changed: See the new instructions.   torsemide 100 MG tablet Commonly known as: DEMADEX Take 0.5 tablets (50 mg total) by mouth daily.   traZODone 150 MG tablet Commonly known as: DESYREL TAKE 1 TABLET BY MOUTH AT BEDTIME   venlafaxine XR 75 MG 24 hr capsule Commonly known as: EFFEXOR-XR TAKE 1 CAPSULE BY MOUTH DAILY WITH BREAKFAST        Vitals:   05/02/21 0433 05/02/21 0818  BP: (!) 145/97 (!) 141/92  Pulse: (!) 52 (!) 56  Resp: 17 17  Temp: 97.7 F (36.5 C) 97.9 F (36.6 C)  SpO2: 95% 95%    Skin clean, dry and intact without evidence of skin break down, no evidence of skin tears noted. IV catheter discontinued intact. Site without signs and symptoms of complications. Dressing and pressure applied. Pt denies pain at this time. No complaints noted.  An After Visit Summary was printed and given to the patient. Patient escorted via [place], and D/C home via private auto.  Raj Janus

## 2021-05-07 ENCOUNTER — Other Ambulatory Visit: Payer: Self-pay | Admitting: Family Medicine

## 2021-05-08 ENCOUNTER — Ambulatory Visit (INDEPENDENT_AMBULATORY_CARE_PROVIDER_SITE_OTHER): Payer: Medicare HMO

## 2021-05-08 ENCOUNTER — Other Ambulatory Visit: Payer: Self-pay | Admitting: Family Medicine

## 2021-05-08 DIAGNOSIS — I25118 Atherosclerotic heart disease of native coronary artery with other forms of angina pectoris: Secondary | ICD-10-CM | POA: Diagnosis not present

## 2021-05-08 DIAGNOSIS — R778 Other specified abnormalities of plasma proteins: Secondary | ICD-10-CM | POA: Diagnosis not present

## 2021-05-08 DIAGNOSIS — I214 Non-ST elevation (NSTEMI) myocardial infarction: Secondary | ICD-10-CM | POA: Diagnosis not present

## 2021-05-08 DIAGNOSIS — I10 Essential (primary) hypertension: Secondary | ICD-10-CM | POA: Diagnosis not present

## 2021-05-08 DIAGNOSIS — I509 Heart failure, unspecified: Secondary | ICD-10-CM

## 2021-05-08 DIAGNOSIS — J449 Chronic obstructive pulmonary disease, unspecified: Secondary | ICD-10-CM | POA: Diagnosis not present

## 2021-05-08 DIAGNOSIS — M5410 Radiculopathy, site unspecified: Secondary | ICD-10-CM

## 2021-05-08 DIAGNOSIS — J418 Mixed simple and mucopurulent chronic bronchitis: Secondary | ICD-10-CM | POA: Diagnosis not present

## 2021-05-08 DIAGNOSIS — I480 Paroxysmal atrial fibrillation: Secondary | ICD-10-CM | POA: Diagnosis not present

## 2021-05-08 NOTE — Telephone Encounter (Signed)
Requested medication (s) are due for refill today: yes   Requested medication (s) are on the active medication list: yes   Last refill:  04/09/2021  Future visit scheduled: yes   Notes to clinic:  this refill cannot be delegated    Requested Prescriptions  Pending Prescriptions Disp Refills   oxyCODONE-acetaminophen (PERCOCET) 10-325 MG tablet [Pharmacy Med Name: OXYCODONE-APAP 10-325 MG TAB] 180 tablet     Sig: TAKE 1 TABLET BY MOUTH EVERY 4 HOURS AS NEEDED FOR PAIN      Not Delegated - Analgesics:  Opioid Agonist Combinations Failed - 05/08/2021  9:40 AM      Failed - This refill cannot be delegated      Passed - Urine Drug Screen completed in last 360 days      Passed - Valid encounter within last 6 months    Recent Outpatient Visits           1 month ago Type 2 diabetes mellitus with diabetic nephropathy, without long-term current use of insulin ([place])   [facility] Birdie Sons, MD   4 months ago Type 2 diabetes mellitus with diabetic nephropathy, without long-term current use of insulin ([place])   [facility] Birdie Sons, MD   6 months ago Chest wall pain   [facility] Birdie Sons, MD   7 months ago Type 2 diabetes mellitus with diabetic nephropathy, without long-term current use of insulin [facility])   [facility] Birdie Sons, MD   12 months ago Type 2 diabetes mellitus with diabetic nephropathy, without long-term current use of insulin [facility])   [place], [name], MD       Future Appointments             In 1 week [name], [name], MD [name]   In 3 months [name], [name], MD [facility], [place]   In 3 months Ralene Bathe, MD [place]

## 2021-05-08 NOTE — Chronic Care Management (AMB) (Signed)
Chronic Care Management   Follow Up Note   05/08/2021 Name: Lawrence [name] MRN: [id number] DOB: 08-31-1956  Primary Care Provider: Birdie Sons, MD Reason for referral : Chronic Care Management   Lawrence [name] is a 65 y.o. year old male who is a primary care patient of Lawrence Sons, MD. He is currently enrolled in the Chronic Care Management program.  Review of Lawrence [name] status, including review of consultants reports, relevant labs and test results was conducted today. Collaboration with appropriate care team members was performed as part of the comprehensive evaluation and provision of chronic care management services.     SDOH (Social Determinants of Health) assessments performed: No     Outpatient Encounter Medications as of 05/08/2021  Medication Sig Note   Accu-Chek Softclix Lancets lancets Use as instructed to check sugar daily for type 2 diabetes.    allopurinol (ZYLOPRIM) 300 MG tablet TAKE 1 TABLET BY MOUTH TWICE A DAY    ALPRAZolam (XANAX) 1 MG tablet Take 1 tablet (1 mg total) by mouth 3 (three) times daily.    amLODipine (NORVASC) 2.5 MG tablet Take 1 tablet (2.5 mg total) by mouth daily.    aspirin 81 MG chewable tablet Chew 81 mg by mouth daily. Swallows whole    atorvastatin (LIPITOR) 80 MG tablet TAKE ONE TABLET BY MOUTH AT BEDTIME    Blood Glucose Calibration (ACCU-CHEK GUIDE CONTROL) LIQD Use with blood glucose monitor as directed    Blood Glucose Monitoring Suppl (ACCU-CHEK GUIDE) w/Device KIT Use to check blood sugars as directed    BREO ELLIPTA 100-25 MCG/INH AEPB INHALE 1 PUFF INTO THE LUNGS DAILY    cetirizine (ZYRTEC) 10 MG tablet TAKE ONE TABLET BY MOUTH AT BEDTIME    docusate sodium (COLACE) 100 MG capsule Take 100 mg by mouth daily as needed for mild constipation.    ELIQUIS 5 MG TABS tablet TAKE 1 TABLET BY MOUTH TWICE A DAY    FARXIGA 10 MG TABS tablet TAKE 1 TABLET BY MOUTH DAILY    glucose blood (ACCU-CHEK GUIDE) test strip Use as  instructed to check sugar daily for type 2 diabetes.    isosorbide mononitrate (IMDUR) 120 MG 24 hr tablet TAKE 1 TABLET BY MOUTH DAILY    linaclotide (LINZESS) 72 MCG capsule Take 72 mcg by mouth daily before breakfast.     lisinopril (ZESTRIL) 10 MG tablet TAKE 1 TABLET BY MOUTH DAILY    metFORMIN (GLUCOPHAGE-XR) 500 MG 24 hr tablet Take 1 tablet (500 mg total) by mouth 2 (two) times daily.    methocarbamol (ROBAXIN) 500 MG tablet Take 1 tablet (500 mg total) by mouth every 8 (eight) hours as needed for muscle spasms.    naloxone (NARCAN) nasal spray 4 mg/0.1 mL 1 spray into nostril x1 and may repeat every 2-3 minutes until patient is responsive or EMS arrives    nitroGLYCERIN (NITROSTAT) 0.4 MG SL tablet PLACE ONE TABLET UNDER THE TONGUE EVERY 5 MINUTES FOR CHEST PAIN. IF NO RELIEF PAST 3RD TAB GO TO EMERGENCY ROOM    omega-3 acid ethyl esters (LOVAZA) 1 g capsule TAKE 4 CAPSULES BY MOUTH DAILY    omeprazole (PRILOSEC) 40 MG capsule Take 40 mg by mouth 2 (two) times daily.    oxyCODONE-acetaminophen (PERCOCET) 10-325 MG tablet TAKE 1 TABLET BY MOUTH EVERY 4 HOURS AS NEEDED FOR PAIN    ranolazine (RANEXA) 1000 MG SR tablet Take 1 tablet (1,000 mg total) by mouth 2 (two) times daily.  sotalol (BETAPACE) 80 MG tablet Take 40 mg by mouth 2 (two) times daily.    tiotropium (SPIRIVA HANDIHALER) 18 MCG inhalation capsule Place 1 capsule (18 mcg total) into inhaler and inhale daily.    torsemide (DEMADEX) 100 MG tablet Take 0.5 tablets (50 mg total) by mouth daily. 01/15/2021: Reports taking as needed.   traZODone (DESYREL) 150 MG tablet TAKE 1 TABLET BY MOUTH AT BEDTIME    venlafaxine XR (EFFEXOR-XR) 75 MG 24 hr capsule TAKE 1 CAPSULE BY MOUTH DAILY WITH BREAKFAST    [DISCONTINUED] albuterol (VENTOLIN HFA) 108 (90 Base) MCG/ACT inhaler INHALE 2 PUFFS BY MOUTH EVERY 6 HOURS AS NEEDED FOR SHORTNESS OF BREATH (Patient not taking: No sig reported)    No facility-administered encounter medications on file  as of 05/08/2021.     Objective:  Patient Care Plan: COPD (Adult)     Problem Identified: Symptom Exacerbation (COPD)      Long-Range Goal: Symptom Exacerbation Prevented or Minimized   Start Date: 01/15/2021  Expected End Date: 05/15/2021  Priority: High  Current Barriers:  Chronic Disease Management support and educational needs r/t COPD  Case Manager Clinical Goal(s): Over the next 120 days, patient will not require hospitalization for complications r/t COPD exacerbation  Interventions:  Collaboration with Lawrence Sons, MD regarding development and update of comprehensive plan of care as evidenced by provider attestation and co-signature Inter-disciplinary care team collaboration (see longitudinal plan of care) Reviewed medications and current treatment plan. Reports doing very well with COPD self-management. Symptoms are well controlled with prescribed inhalers. Denies cough. Experiences exertional dyspnea but denies symptoms at rest. No change or decline in activity tolerance. Encouraged to continue daily self assessment. Reviewed indications for notifying his provider for unrelieved symptoms. Reviewed worsening s/sx that require immediate medical attention.    Self-Care Activities/Patient Goals: -Take medications and utilize inhalers as prescribed -Assess symptoms daily and notify provider if in the yellow zone for 48 hrs without improvement -Avoid extreme temperatures and strenuous activites -Follow recommendations to prevent respiratory infection -Notify provider or care management team with questions and new concerns as needed   Follow Up Plan:  Will follow up next month      Patient Care Plan: Heart Failure (Adult)     Problem Identified: Symptom Exacerbation (Heart Failure)      Long-Range Goal: Symptom Exacerbation Prevented or Minimized   Start Date: 01/15/2021  Expected End Date: 05/15/2021  Priority: High  Note:   Wt Readings from Last 3 Encounters:   01/03/21 223 lb 8 oz (101.4 kg)  12/16/20 217 lb (98.4 kg)  12/12/20 219 lb 2.2 oz (99.4 kg)    Current Barriers:  Chronic Disease Management support and educational needs r/t CHF.  Case Manager Clinical Goal(s):  Over the next 120 days, patient will not require hospitalization or emergent care d/t complications r/t CHF exacerbation.  Interventions:  Collaboration with Lawrence Sons, MD regarding development and update of comprehensive plan of care as evidenced by provider attestation and co-signature Inter-disciplinary care team collaboration (see longitudinal plan of care) Reviewed current plan for CHF management. Reports weights have remained stable. Denies increased abdominal or lower extremity edema. Reports not requiring torsemide for several weeks. Denies chest pain or palpitations. Experiences exertional dyspnea. Denies shortness of breath at rest. No changes in activity tolerance. Reports doing very well with nutritional intake. Encouraged to continue monitoring weights and recording readings.  Reviewed indications for notifying a provider if weight increases more than 3 lbs overnight or 5  lbs within a week. Reviewed worsening s/sx related to CHF exacerbation and indications for seeking immediate medical attention.   Patient Goals/Self-Care Activities:  -Take all medications as prescribed -Monitor weight and record readings -Adhere to recommended cardiac prudent/heart healthy diet -Notify provider for weight gain outside of established parameters -Contact provider or care management team with questions and new concerns as needed.   Follow Up Plan:  Will follow up next month          PLAN A member of the care management team will follow up with Mr. Vold next month.    [facility] Care Management [facility] [phone]

## 2021-05-21 ENCOUNTER — Inpatient Hospital Stay: Payer: Medicare HMO | Admitting: Family Medicine

## 2021-05-22 ENCOUNTER — Inpatient Hospital Stay: Payer: Medicare HMO

## 2021-05-22 ENCOUNTER — Inpatient Hospital Stay: Payer: Medicare HMO | Admitting: Oncology

## 2021-05-22 ENCOUNTER — Telehealth: Payer: Self-pay

## 2021-05-22 NOTE — Telephone Encounter (Signed)
Please review.  Do you need to see him sooner than 06/09/2021  Thanks,   -Mickel Baas

## 2021-05-22 NOTE — Telephone Encounter (Signed)
Copied from [place] [phone]. Topic: Appointment Scheduling - Scheduling Inquiry for Clinic >> May 22, 2021  9:23 AM Lawrence [name] wrote: Reason for CRM: pt called in to reschedule his hospital follow up apt. Pt says that he forgot about his apt on 7/6. Rescheduled pt's for next available apt. Pt would like to know if provider need to see him sooner due to the hospital visit concern?   If so, please assist pt further.

## 2021-05-22 NOTE — Progress Notes (Deleted)
[place]  [phone]) [phone] [phone]) [phone]  ID: Lawrence [name] OB: 02/05/1956  MR#: [id number]  HKV#:[id number]  Patient Care Team: Birdie Sons, MD as PCP - General (Family Medicine) Allyne Gee, MD as Consulting Physician (Pulmonary Disease) Marisa Hua, PA-C as Physician Assistant Anell Barr, OD as Consulting Physician (Optometry) Ubaldo Glassing Javier Docker, MD as Consulting Physician (Cardiology) Lloyd Huger, MD as Consulting Physician (Oncology) Neldon Labella, RN as Case Manager  CHIEF COMPLAINT: Polycythemia.  INTERVAL HISTORY: Patient returns to clinic today for repeat laboratory work and consideration of phlebotomy. He was last seen in clinic 01/03/2021.   Elevated hemoglobin was discovered during routine blood work back in December 2021.  He has been asymptomatic.  Work-up to date has been unrevealing.  It does not appear he has had a phlebotomy since diagnosis.  He has missed several appointments secondary to transportation issues.  He denies any hyperviscosity symptoms including headaches, seizure-like activity or changes in his vision. He denies any recent fevers or illnesses.  He has a good appetite and denies weight loss.  He has no chest pain, shortness of breath, cough, or hemoptysis.  He denies any nausea, vomiting, constipation, or diarrhea.  He has no urinary complaints.  Patient feels at his baseline offers no specific complaints today.  Review of Systems  Constitutional:  Positive for malaise/fatigue. Negative for fever and weight loss.  HENT:  Negative for congestion and hearing loss.   Eyes:  Negative for blurred vision and double vision.  Respiratory:  Negative for cough.   Cardiovascular:  Negative for chest pain and palpitations.  Gastrointestinal:  Negative for abdominal pain, constipation, diarrhea, nausea and vomiting.  Genitourinary:  Negative for frequency and urgency.  Skin:  Negative for rash.   Neurological:  Positive for dizziness. Negative for tingling and headaches.  Endo/Heme/Allergies:  Does not bruise/bleed easily.  Psychiatric/Behavioral:  Negative for depression. The patient is not nervous/anxious and does not have insomnia.    As per HPI. Otherwise, a complete review of systems is negative.  PAST MEDICAL HISTORY: Past Medical History:  Diagnosis Date   A-fib ([place])    Anemia    Anginal pain ([place])    Anxiety    Arthritis    Asthma    CAD (coronary artery disease)    a. 2002 CABGx2 (LIMA->LAD, VG->VG->OM1);  b. 09/2012 DES->OM;  c. 03/2015 PTCA of LAD [facility]) in setting of atretic LIMA; d. 05/2015 Cath [facility]): nonobs dzs; e. 06/2015 Cath ([facility]): LM nl, LAD 45p/d ISR, 50d, D1/2 small, LCX 50p/d ISR, OM1 70ost, 30 ISR, VG->OM1 50ost, 29m LIMA->LAD 99p/d - atretic, RCA dom, nl; f.cath 10/16: 40-50%(FFR 0.90) pLAD, 75% (FFR 0.77) mLAD s/p PCI/DES, oRCA 40% (FFR0.95)   Cancer (HCC)    SKIN CANCER ON BACK   Celiac disease    Chronic diastolic CHF (congestive heart failure) ([place]    a. 06/2009 Echo: EF 60-65%, Gr 1 DD, triv AI, mildly dil LA, nl RV.   COPD (chronic obstructive pulmonary disease) ([place]    a. Chronic bronchitis and emphysema.   DDD (degenerative disc disease), lumbar    Diverticulosis    Dysrhythmia    Essential hypertension    GERD (gastroesophageal reflux disease)    History of hiatal hernia    History of kidney stones    H/O   History of tobacco abuse    a. Quit 2014.   Myocardial infarction ([facility] 2002   4 STENTS   Pancreatitis  PSVT (paroxysmal supraventricular tachycardia) ([place])    a. 10/2012 Noted on Zio Patch.   Sleep apnea    LOST CORD TO CPAP -ONLY 02 @ BEDTIME   Tubular adenoma of colon    Type II diabetes mellitus ([place])     PAST SURGICAL HISTORY: Past Surgical History:  Procedure Laterality Date   BYPASS GRAFT     CARDIAC CATHETERIZATION N/A 07/12/2015   rocedure: Left Heart Cath and Cors/Grafts Angiography;  Surgeon: Belva Crome,  MD;  Location: [place] CV LAB;  Service: Cardiovascular;  Laterality: N/A;   CARDIAC CATHETERIZATION Right 10/07/2015   Procedure: Left Heart Cath and Cors/Grafts Angiography;  Surgeon: Dionisio David, MD;  Location: [place] CV LAB;  Service: Cardiovascular;  Laterality: Right;   CARDIAC CATHETERIZATION N/A 04/06/2016   Procedure: Left Heart Cath and Coronary Angiography;  Surgeon: Yolonda Kida, MD;  Location: [place] CV LAB;  Service: Cardiovascular;  Laterality: N/A;   CARDIAC CATHETERIZATION  04/06/2016   Procedure: Bypass Graft Angiography;  Surgeon: Yolonda Kida, MD;  Location: [place] CV LAB;  Service: Cardiovascular;;   CARDIAC CATHETERIZATION N/A 11/02/2016   Procedure: Left Heart Cath and Cors/Grafts Angiography and possible PCI;  Surgeon: Yolonda Kida, MD;  Location: [place] CV LAB;  Service: Cardiovascular;  Laterality: N/A;   CARDIAC CATHETERIZATION N/A 11/02/2016   Procedure: Coronary Stent Intervention;  Surgeon: Yolonda Kida, MD;  Location: [place] CV LAB;  Service: Cardiovascular;  Laterality: N/A;   CHOLECYSTECTOMY     CIRCUMCISION N/A 06/09/2019   Procedure: CIRCUMCISION ADULT;  Surgeon: Billey Co, MD;  Location: [facility] ORS;  Service: Urology;  Laterality: N/A;   COLONOSCOPY WITH PROPOFOL N/A 04/01/2018   Procedure: COLONOSCOPY WITH PROPOFOL;  Surgeon: Manya Silvas, MD;  Location[facility] ENDOSCOPY;  Service: Endoscopy;  Laterality: N/A;   ESOPHAGEAL DILATION     ESOPHAGOGASTRODUODENOSCOPY (EGD) WITH PROPOFOL N/A 04/01/2018   Procedure: ESOPHAGOGASTRODUODENOSCOPY (EGD) WITH PROPOFOL;  Surgeon: Manya Silvas, MD;  Location: [facility] ENDOSCOPY;  Service: Endoscopy;  Laterality: N/A;   LEFT HEART CATH AND CORS/GRAFTS ANGIOGRAPHY N/A 06/12/2019   Procedure: LEFT HEART CATH AND CORS/GRAFTS ANGIOGRAPHY;  Surgeon: Teodoro Spray, MD;  Location: [place] CV LAB;  Service: Cardiovascular;  Laterality: N/A;   LEFT HEART CATH  AND CORS/GRAFTS ANGIOGRAPHY N/A 03/11/2020   Procedure: LEFT HEART CATH AND CORS/GRAFTS ANGIOGRAPHY;  Surgeon: Isaias Cowman, MD;  Location: [place] CV LAB;  Service: Cardiovascular;  Laterality: N/A;   LEFT HEART CATH AND CORS/GRAFTS ANGIOGRAPHY N/A 05/01/2021   Procedure: LEFT HEART CATH AND CORS/GRAFTS ANGIOGRAPHY;  Surgeon: Corey Skains, MD;  Location: [place] CV LAB;  Service: Cardiovascular;  Laterality: N/A;   TONSILLECTOMY     VASCULAR SURGERY      FAMILY HISTORY: Family History  Problem Relation Age of Onset   Heart attack Mother    Depression Mother    Heart disease Mother    COPD Mother    Hypertension Mother    Heart attack Father    Diabetes Father    Depression Father    Heart disease Father    Cirrhosis Father    Parkinson's disease Brother     ADVANCED DIRECTIVES (Y/N):  N  HEALTH MAINTENANCE: Social History   Tobacco Use   Smoking status: Former    Packs/day: 3.00    Years: 50.00    Pack years: 150.00    Types: Cigarettes    Quit date: 04/22/2013  Years since quitting: 8.0   Smokeless tobacco: Never   Tobacco comments:    Reports not smoking for approx 8 years.  Vaping Use   Vaping Use: Never used  Substance Use Topics   Alcohol use: No    Comment: remotely quit alcohol use. Hx of heavy alcohol use.   Drug use: Yes    Types: Marijuana    Comment: occasionally      Allergies  Allergen Reactions   Demerol  [Meperidine Hcl]    Demerol [Meperidine] Hives   Jardiance [Empagliflozin] Other (See Comments)    Perineal pain   Prednisone Other (See Comments) and Hypertension    Pt states that this medication puts him in A-fib    Sulfa Antibiotics Hives   Albuterol Sulfate [Albuterol] Palpitations and Other (See Comments)    Pt currently uses this medication.     Morphine Sulfate Nausea And Vomiting, Rash and Other (See Comments)    Pt states that he is only allergic to the tablet form of this medication.      Current  Outpatient Medications  Medication Sig Dispense Refill   Accu-Chek Softclix Lancets lancets Use as instructed to check sugar daily for type 2 diabetes. 100 each 4   allopurinol (ZYLOPRIM) 300 MG tablet TAKE 1 TABLET BY MOUTH TWICE A DAY 180 tablet 0   ALPRAZolam (XANAX) 1 MG tablet Take 1 tablet (1 mg total) by mouth 3 (three) times daily. 90 tablet 3   amLODipine (NORVASC) 2.5 MG tablet Take 1 tablet (2.5 mg total) by mouth daily. 90 tablet 3   aspirin 81 MG chewable tablet Chew 81 mg by mouth daily. Swallows whole     atorvastatin (LIPITOR) 80 MG tablet TAKE ONE TABLET BY MOUTH AT BEDTIME 90 tablet 1   Blood Glucose Calibration (ACCU-CHEK GUIDE CONTROL) LIQD Use with blood glucose monitor as directed 1 each 3   Blood Glucose Monitoring Suppl (ACCU-CHEK GUIDE) w/Device KIT Use to check blood sugars as directed 1 kit 0   BREO ELLIPTA 100-25 MCG/INH AEPB INHALE 1 PUFF INTO THE LUNGS DAILY 60 each 2   cetirizine (ZYRTEC) 10 MG tablet TAKE ONE TABLET BY MOUTH AT BEDTIME 90 tablet 1   docusate sodium (COLACE) 100 MG capsule Take 100 mg by mouth daily as needed for mild constipation.     ELIQUIS 5 MG TABS tablet TAKE 1 TABLET BY MOUTH TWICE A DAY 180 tablet 3   FARXIGA 10 MG TABS tablet TAKE 1 TABLET BY MOUTH DAILY 30 tablet 12   glucose blood (ACCU-CHEK GUIDE) test strip Use as instructed to check sugar daily for type 2 diabetes. 100 strip 4   isosorbide mononitrate (IMDUR) 120 MG 24 hr tablet TAKE 1 TABLET BY MOUTH DAILY 90 tablet 0   linaclotide (LINZESS) 72 MCG capsule Take 72 mcg by mouth daily before breakfast.      lisinopril (ZESTRIL) 10 MG tablet TAKE 1 TABLET BY MOUTH DAILY 90 tablet 4   metFORMIN (GLUCOPHAGE-XR) 500 MG 24 hr tablet Take 1 tablet (500 mg total) by mouth 2 (two) times daily. 60 tablet 0   methocarbamol (ROBAXIN) 500 MG tablet Take 1 tablet (500 mg total) by mouth every 8 (eight) hours as needed for muscle spasms. 90 tablet 1   naloxone (NARCAN) nasal spray 4 mg/0.1 mL 1  spray into nostril x1 and may repeat every 2-3 minutes until patient is responsive or EMS arrives 2 each 2   nitroGLYCERIN (NITROSTAT) 0.4 MG SL tablet PLACE ONE TABLET  UNDER THE TONGUE EVERY 5 MINUTES FOR CHEST PAIN. IF NO RELIEF PAST 3RD TAB GO TO EMERGENCY ROOM 30 tablet 3   omega-3 acid ethyl esters (LOVAZA) 1 g capsule TAKE 4 CAPSULES BY MOUTH DAILY 120 capsule 12   omeprazole (PRILOSEC) 40 MG capsule Take 40 mg by mouth 2 (two) times daily.     oxyCODONE-acetaminophen (PERCOCET) 10-325 MG tablet TAKE 1 TABLET BY MOUTH EVERY 4 HOURS AS NEEDED FOR PAIN 180 tablet 0   ranolazine (RANEXA) 1000 MG SR tablet Take 1 tablet (1,000 mg total) by mouth 2 (two) times daily. 60 tablet 5   sotalol (BETAPACE) 80 MG tablet Take 40 mg by mouth 2 (two) times daily.     tiotropium (SPIRIVA HANDIHALER) 18 MCG inhalation capsule Place 1 capsule (18 mcg total) into inhaler and inhale daily. 30 capsule 12   torsemide (DEMADEX) 100 MG tablet Take 0.5 tablets (50 mg total) by mouth daily. 30 tablet 3   traZODone (DESYREL) 150 MG tablet TAKE 1 TABLET BY MOUTH AT BEDTIME 90 tablet 0   venlafaxine XR (EFFEXOR-XR) 75 MG 24 hr capsule TAKE 1 CAPSULE BY MOUTH DAILY WITH BREAKFAST 90 capsule 4   No current facility-administered medications for this visit.    OBJECTIVE: There were no vitals filed for this visit.   There is no height or weight on file to calculate BMI.    ECOG FS:0 - Asymptomatic  Physical Exam Vitals and nursing note reviewed.  HENT:     Head: Normocephalic and atraumatic.     Nose: No congestion.     Mouth/Throat:     Mouth: Mucous membranes are moist.     Pharynx: Oropharynx is clear.  Eyes:     General:        Right eye: No discharge.        Left eye: No discharge.     Extraocular Movements: Extraocular movements intact.     Pupils: Pupils are equal, round, and reactive to light.  Cardiovascular:     Rate and Rhythm: Normal rate and regular rhythm.     Pulses: Normal pulses.     Heart  sounds: No murmur heard. Pulmonary:     Effort: Pulmonary effort is normal.  Abdominal:     General: Bowel sounds are normal. There is no distension.     Tenderness: There is no abdominal tenderness. There is no guarding.  Musculoskeletal:        General: No swelling or deformity.     Right lower leg: No edema.     Left lower leg: No edema.  Skin:    General: Skin is warm and dry.     Capillary Refill: Capillary refill takes less than 2 seconds.  Neurological:     Mental Status: He is alert and oriented to person, place, and time.  Psychiatric:        Mood and Affect: Mood normal.        Thought Content: Thought content normal.    LAB RESULTS:  Lab Results  Component Value Date   NA 137 04/30/2021   K 3.9 04/30/2021   CL 95 (L) 04/30/2021   CO2 37 (H) 04/30/2021   GLUCOSE 176 (H) 04/30/2021   BUN 31 (H) 04/30/2021   CREATININE 1.12 04/30/2021   CALCIUM 9.3 04/30/2021   PROT 6.0 (L) 04/27/2021   ALBUMIN 3.6 04/27/2021   AST 16 04/27/2021   ALT 12 04/27/2021   ALKPHOS 63 04/27/2021   BILITOT 1.1 04/27/2021  GFRNONAA >60 04/30/2021   GFRAA 97 09/24/2020    Lab Results  Component Value Date   WBC 7.1 04/30/2021   NEUTROABS 3.9 04/27/2021   HGB 17.0 04/30/2021   HCT 51.6 04/30/2021   MCV 95.9 04/30/2021   PLT 175 04/30/2021    STUDIES: CARDIAC CATHETERIZATION  Result Date: 05/01/2021  Prox LAD to Mid LAD lesion is 30% stenosed.  Mid LAD lesion is 40% stenosed.  Non-stenotic Mid LAD to Dist LAD lesion was previously treated.  1st Mrg lesion is 70% stenosed.  Non-stenotic Prox Cx to Mid Cx lesion was previously treated.  Prox RCA lesion is 15% stenosed.  Dist RCA lesion is 25% stenosed.  Non-stenotic Origin to Prox Graft lesion was previously treated.  LIMA and is small.  RPDA-1 lesion is 90% stenosed.  RPDA-2 lesion is 85% stenosed.  RPAV-1 lesion is 85% stenosed with 85% stenosed side branch in 1st RPL.  RPAV-2 lesion is 60% stenosed.  65 year old male  with known cardiovascular disease hypertension hyperlipidemia status post previous coronary to bypass graft and multiple stents in multiple arteries with atypical chest pain and no evidence of myocardial infarction Left ventricle shows normal LV systolic function with ejection fraction of 55% by echocardiogram Patient had patent saphenous vein graft to obtuse marginal 1 Atretic LIMA to LAD not injected Patent stent of proximal and mid LAD unchanged from 2021 Patent stent of proximal circumflex unchanged from 2021 Patent right coronary artery with distal PDA and PL stenosis diffuse and not amenable to PCI and stent placement and/or intervention Plan Continue maximal medication management for anginal symptoms including isosorbide beta-blocker calcium channel blocker and Ranexa High intensity cholesterol therapy Cardiac rehabilitation No further cardiac intervention at this time   DG Chest Portable 1 View  Result Date: 04/26/2021 CLINICAL DATA:  Chest pain EXAM: PORTABLE CHEST 1 VIEW COMPARISON:  12/10/2020 FINDINGS: Prior CABG. Heart and mediastinal contours are within normal limits. Scarring in the lingula. Right lung clear. No effusions. Heart is normal size. No acute bony abnormality. IMPRESSION: No active disease. Electronically Signed   By: Rolm Baptise M.D.   On: 04/26/2021 22:35   ECHOCARDIOGRAM COMPLETE  Result Date: 04/28/2021    ECHOCARDIOGRAM REPORT   Patient Name:   Lawrence [name] [facility] Date of Exam: 04/27/2021 Medical Rec #:  [id number]       Height:       67.0 in Accession #:    [id number]      Weight:       220.0 lb Date of Birth:  06/13/1956       BSA:          2.106 m Patient Age:    31 years        BP:           119/80 mmHg Patient Gender: M               HR:           116 bpm. Exam Location:  [facility] Procedure: 2D Echo and Intracardiac Opacification Agent Indications:     Chest Pain R07.9  History:         Patient has no prior history of Echocardiogram examinations.  Sonographer:     Kathlen Brunswick  RDCS Referring Phys:  [place] Diagnosing Phys: [name] MD  Sonographer Comments: Technically difficult study due to poor echo windows. Image acquisition challenging due to respiratory motion. IMPRESSIONS  1. Left ventricular ejection fraction, by estimation, is 45 to 50%.  The left ventricle has mildly decreased function. The left ventricle has no regional wall motion abnormalities. Left ventricular diastolic parameters were normal.  2. Right ventricular systolic function is normal. The right ventricular size is normal.  3. The mitral valve is normal in structure. Trivial mitral valve regurgitation.  4. The aortic valve is normal in structure. Aortic valve regurgitation is trivial. FINDINGS  Left Ventricle: Left ventricular ejection fraction, by estimation, is 45 to 50%. The left ventricle has mildly decreased function. The left ventricle has no regional wall motion abnormalities. Definity contrast agent was given IV to delineate the left ventricular endocardial borders. The left ventricular internal cavity size was normal in size. There is no left ventricular hypertrophy. Left ventricular diastolic parameters were normal. Right Ventricle: The right ventricular size is normal. No increase in right ventricular wall thickness. Right ventricular systolic function is normal. Left Atrium: Left atrial size was normal in size. Right Atrium: Right atrial size was normal in size. Pericardium: There is no evidence of pericardial effusion. Mitral Valve: The mitral valve is normal in structure. Trivial mitral valve regurgitation. Tricuspid Valve: The tricuspid valve is normal in structure. Tricuspid valve regurgitation is trivial. Aortic Valve: The aortic valve is normal in structure. Aortic valve regurgitation is trivial. Aortic valve peak gradient measures 8.0 mmHg. Pulmonic Valve: The pulmonic valve was normal in structure. Pulmonic valve regurgitation is not visualized. Aorta: The aortic root and  ascending aorta are structurally normal, with no evidence of dilitation. IAS/Shunts: No atrial level shunt detected by color flow Doppler.  LEFT VENTRICLE PLAX 2D LVIDd:         5.37 cm  Diastology LVIDs:         3.56 cm  LV e' medial:    10.40 cm/s LV PW:         1.39 cm  LV E/e' medial:  5.4 LV IVS:        1.21 cm  LV e' lateral:   12.40 cm/s LVOT diam:     1.90 cm  LV E/e' lateral: 4.5 LV SV:         53 LV SV Index:   25 LVOT Area:     2.84 cm  RIGHT VENTRICLE RV Basal diam:  3.26 cm RV S prime:     8.16 cm/s TAPSE (M-mode): 1.6 cm LEFT ATRIUM             Index LA diam:        3.70 cm 1.76 cm/m LA Vol (A2C):   40.9 ml 19.42 ml/m LA Vol (A4C):   47.6 ml 22.60 ml/m LA Biplane Vol: 46.1 ml 21.89 ml/m  AORTIC VALVE                PULMONIC VALVE AV Area (Vmax): 2.27 cm    PV Vmax:       0.77 m/s AV Vmax:        141.00 cm/s PV Peak grad:  2.4 mmHg AV Peak Grad:   8.0 mmHg LVOT Vmax:      113.00 cm/s LVOT Vmean:     77.000 cm/s LVOT VTI:       0.186 m  AORTA Ao Root diam: 3.10 cm Ao Asc diam:  3.60 cm MITRAL VALVE MV Area (PHT): 4.68 cm    SHUNTS MV Decel Time: 162 msec    Systemic VTI:  0.19 m MV E velocity: 56.10 cm/s  Systemic Diam: 1.90 cm [name] MD Electronically signed by [name] MD Signature Date/Time: 04/28/2021/8:29:52  AM    Final     ASSESSMENT: Polycythemia.  PLAN:   1. Polycythemia: -Unclear etiology.   -Hemoglobin baseline between 17-20. -Work-up to date was negative including JAK2 and CLL labs. -Mildly elevated erythropoietin level. -CT abdomen/pelvis did not show any distinct pathology or renal lesions. -Denies any hyperviscosity symptoms (itching, signs of clotting) -He has had transportation trouble in the past.  -Labs from today show**** a hemoglobin of 17.3, hematocrit 51.7, -Ferritin 31 iron saturation 20% (on 12/24/2020).  -Patient is not symptomatic.  Do not recommend phlebotomy today given hemoglobin has trended down. -RTC in 3 months for follow-up with labs  and possible phlebotomy.  Patient to call clinic if he develops any hyperviscosity symptoms.  Greater than 50% was spent in counseling and coordination of care with this patient including but not limited to discussion of the relevant topics above (See A&P) including, but not limited to diagnosis and management of acute and chronic medical conditions.

## 2021-05-23 NOTE — Telephone Encounter (Signed)
Pt states he is feeling well and would like to wait until 06/09/2021

## 2021-05-23 NOTE — Telephone Encounter (Signed)
If he's feeling well he can he can keep the 06-09-2021 appt. If not we could work him in Wednesday the 13th at 11:20.

## 2021-05-23 NOTE — Patient Instructions (Signed)
Thank you for allowing the Chronic Care Management team to participate in your care. It was a pleasure speaking with you. Please feel free to contact me with questions.  Goals Addressed: Patient Care Plan: COPD (Adult)     Problem Identified: Symptom Exacerbation (COPD)      Long-Range Goal: Symptom Exacerbation Prevented or Minimized Completed 05/08/2021  Start Date: 01/15/2021  Expected End Date: 05/15/2021  Priority: High  Note:   Current Barriers:  Chronic Disease Management support and educational needs r/t COPD  Case Manager Clinical Goal(s): Over the next 120 days, patient will not require hospitalization for complications r/t COPD exacerbation  Interventions:  Collaboration with Birdie Sons, MD regarding development and update of comprehensive plan of care as evidenced by provider attestation and co-signature Inter-disciplinary care team collaboration (see longitudinal plan of care) Reviewed medications and current treatment plan. Reports doing very well with COPD self-management. Symptoms remain well controlled with prescribed inhalers. Denies cough. Continues to experience exertional dyspnea but denies symptoms at rest. Report doing very well in the home. Able to complete ADL's and tasks in the home without difficultly. Reports doing very well with COPD self- management Encouraged to continue daily symptom assessments. Reviewed worsening s/sx that require immediate medical attention.    Self-Care Activities/Patient Goals: -Take medications and utilize inhalers as prescribed -Assess symptoms daily and notify provider if in the yellow zone for 48 hrs without improvement -Avoid extreme temperatures and strenuous activites -Follow recommendations to prevent respiratory infection -Notify provider or care management team with questions and new concerns as needed   Goal Met     Patient Care Plan: Heart Failure (Adult)     Problem Identified: Symptom Exacerbation  (Heart Failure)      Long-Range Goal: Symptom Exacerbation Prevented or Minimized   Start Date: 01/15/2021  Expected End Date: 05/15/2021  Priority: High  Note:   Current Barriers:  Chronic Disease Management support and educational needs r/t CHF.  Case Manager Clinical Goal(s):  Over the next 120 days, patient will not require hospitalization or emergent care d/t complications r/t CHF exacerbation.  Interventions:  Collaboration with Birdie Sons, MD regarding development and update of comprehensive plan of care as evidenced by provider attestation and co-signature Inter-disciplinary care team collaboration (see longitudinal plan of care) Reviewed treatment plan and recommendations provided by the Cardiology team. Completed a Left Heart Catheterization was performed on 05/01/21. Reports adjusting medication regimen as instructed and following recommended restrictions. Reports experiencing occasional episodes of mild chest discomfort shortly after the procedure. Overall states that he feels very good. Reports his son is available to assist as needed. Reviewed established weight parameters and indications for notifying a provider. Reports weight remain stable. Denies lower extremity or abdominal edema.  Reviewed nutritional intake. Reports doing very well with monitoring sodium intake and maintaining a heart healthy diet. He is currently receiving delivery via [facility] and preparing small meals as needed.  Reviewed worsening s/sx and indications for seeking immediate medical attention.   Patient Goals/Self-Care Activities:  -Take all medications as prescribed -Monitor weight and record readings -Adhere to recommended cardiac prudent/heart healthy diet -Notify provider for weight gain outside of established parameters -Complete follow- up with the Cardiology team as scheduled -Contact provider or care management team with questions and new concerns as needed.   Follow Up Plan:  Will  follow up next month            Mr. Rakes verbalized understanding of the information discussed during the telephonic  outreach. Declined need for mailed/printed instructions. A member of the care management team will follow up with Mr. Whetsel next month.     [facility] [phone]

## 2021-05-26 ENCOUNTER — Other Ambulatory Visit: Payer: Self-pay | Admitting: Family Medicine

## 2021-05-27 ENCOUNTER — Ambulatory Visit (INDEPENDENT_AMBULATORY_CARE_PROVIDER_SITE_OTHER): Payer: Medicare HMO

## 2021-05-27 DIAGNOSIS — J449 Chronic obstructive pulmonary disease, unspecified: Secondary | ICD-10-CM

## 2021-05-27 NOTE — Chronic Care Management (AMB) (Signed)
  Chronic Care Management      05/27/2021 Name: [name] MRN: [id number] DOB: October 18, 1956  Primary Care Provider: Birdie Sons, MD Reason for referral : Chronic Care Management   Lawrence [name] is a 65 y.o. year old male who is a primary care patient of [name], [name], MD. The CCM team was consulted for assistance with chronic disease management and care coordination needs.    He was scheduled for follow-up outreach today. Requested to reschedule the follow-up on a later date. Denies urgent care management concerns but agreed to call if needed.    PLAN:  Will follow up later this month.    [facility]risty [facility] [phone]

## 2021-06-02 ENCOUNTER — Other Ambulatory Visit: Payer: Self-pay | Admitting: Family Medicine

## 2021-06-02 DIAGNOSIS — J449 Chronic obstructive pulmonary disease, unspecified: Secondary | ICD-10-CM | POA: Diagnosis not present

## 2021-06-05 ENCOUNTER — Other Ambulatory Visit: Payer: Self-pay | Admitting: Family Medicine

## 2021-06-05 DIAGNOSIS — M5410 Radiculopathy, site unspecified: Secondary | ICD-10-CM

## 2021-06-05 NOTE — Telephone Encounter (Signed)
Requested medications are due for refill today yes  Requested medications are on the active medication list yes  Last refill 05/08/21  Last visit 4/29  Future visit scheduled 06/09/21  Notes to clinic Not Delegated.

## 2021-06-09 ENCOUNTER — Inpatient Hospital Stay: Payer: Medicare HMO | Admitting: Family Medicine

## 2021-06-09 NOTE — Progress Notes (Deleted)
Established patient visit   Patient: Lawrence [name]   DOB: Aug 14, 1956   65 y.o. Male  MRN: [id number] Visit Date: 06/09/2021  Today's healthcare provider: [name], MD   No chief complaint on file.  Subjective    HPI  Follow up Hospitalization  Patient was admitted to [facility] on 04/26/2021 and discharged on 05/02/2021. He was treated for unstable angina. Treatment for this included -see discharge summary. Telephone follow up was not done. He reports {excellent/good/fair:19992} compliance with treatment. Since hospital discharge, patient has been seen by cardiology.  He reports this condition is {resolved/improved/worsened:23923}.  -----------------------------------------------------------------------------------------   {Show patient history (optional):23778}   Medications: Outpatient Medications Prior to Visit  Medication Sig   Accu-Chek Softclix Lancets lancets Use as instructed to check sugar daily for type 2 diabetes.   allopurinol (ZYLOPRIM) 300 MG tablet TAKE 1 TABLET BY MOUTH TWICE A DAY   ALPRAZolam (XANAX) 1 MG tablet Take 1 tablet (1 mg total) by mouth 3 (three) times daily.   amLODipine (NORVASC) 2.5 MG tablet Take 1 tablet (2.5 mg total) by mouth daily.   aspirin 81 MG chewable tablet Chew 81 mg by mouth daily. Swallows whole   atorvastatin (LIPITOR) 80 MG tablet TAKE ONE TABLET BY MOUTH AT BEDTIME   Blood Glucose Calibration (ACCU-CHEK GUIDE CONTROL) LIQD Use with blood glucose monitor as directed   Blood Glucose Monitoring Suppl (ACCU-CHEK GUIDE) w/Device KIT Use to check blood sugars as directed   BREO ELLIPTA 100-25 MCG/INH AEPB INHALE 1 PUFF INTO THE LUNGS DAILY   cetirizine (ZYRTEC) 10 MG tablet TAKE ONE TABLET BY MOUTH AT BEDTIME   docusate sodium (COLACE) 100 MG capsule Take 100 mg by mouth daily as needed for mild constipation.   ELIQUIS 5 MG TABS tablet TAKE 1 TABLET BY MOUTH TWICE A DAY   FARXIGA 10 MG TABS tablet TAKE 1 TABLET BY MOUTH DAILY    glucose blood (ACCU-CHEK GUIDE) test strip Use as instructed to check sugar daily for type 2 diabetes.   isosorbide mononitrate (IMDUR) 120 MG 24 hr tablet TAKE 1 TABLET BY MOUTH DAILY   linaclotide (LINZESS) 72 MCG capsule Take 72 mcg by mouth daily before breakfast.    lisinopril (ZESTRIL) 10 MG tablet TAKE 1 TABLET BY MOUTH DAILY   metFORMIN (GLUCOPHAGE-XR) 500 MG 24 hr tablet Take 1 tablet (500 mg total) by mouth 2 (two) times daily.   methocarbamol (ROBAXIN) 500 MG tablet Take 1 tablet (500 mg total) by mouth every 8 (eight) hours as needed for muscle spasms.   naloxone (NARCAN) nasal spray 4 mg/0.1 mL 1 spray into nostril x1 and may repeat every 2-3 minutes until patient is responsive or EMS arrives   nitroGLYCERIN (NITROSTAT) 0.4 MG SL tablet PLACE ONE TABLET UNDER THE TONGUE EVERY 5 MINUTES FOR CHEST PAIN. IF NO RELIEF PAST 3RD TAB GO TO EMERGENCY ROOM   omega-3 acid ethyl esters (LOVAZA) 1 g capsule TAKE 4 CAPSULES BY MOUTH DAILY   omeprazole (PRILOSEC) 40 MG capsule Take 40 mg by mouth 2 (two) times daily.   oxyCODONE-acetaminophen (PERCOCET) 10-325 MG tablet TAKE 1 TABLET BY MOUTH EVERY 4 HOURS AS NEEDED FOR PAIN   ranolazine (RANEXA) 1000 MG SR tablet Take 1 tablet (1,000 mg total) by mouth 2 (two) times daily.   sotalol (BETAPACE) 80 MG tablet Take 40 mg by mouth 2 (two) times daily.   tiotropium (SPIRIVA HANDIHALER) 18 MCG inhalation capsule Place 1 capsule (18 mcg total) into inhaler and  inhale daily.   torsemide (DEMADEX) 100 MG tablet Take 0.5 tablets (50 mg total) by mouth daily.   traZODone (DESYREL) 150 MG tablet TAKE 1 TABLET BY MOUTH AT BEDTIME   venlafaxine XR (EFFEXOR-XR) 75 MG 24 hr capsule TAKE 1 CAPSULE BY MOUTH DAILY WITH BREAKFAST   No facility-administered medications prior to visit.    Review of Systems  {Labs  Heme  Chem  Endocrine  Serology  Results Review (optional):23779}   Objective    There were no vitals taken for this visit. {Show previous  vital signs (optional):23777}   Physical Exam  ***  No results found for any visits on 06/09/21.  Assessment & Plan     ***  No follow-ups on file.      {provider attestation***:1}   [name], MD  [facility] [phone] (phone) [phone] (fax)  [place]

## 2021-06-13 ENCOUNTER — Ambulatory Visit: Payer: Self-pay

## 2021-06-13 DIAGNOSIS — E1121 Type 2 diabetes mellitus with diabetic nephropathy: Secondary | ICD-10-CM | POA: Diagnosis not present

## 2021-06-13 DIAGNOSIS — J449 Chronic obstructive pulmonary disease, unspecified: Secondary | ICD-10-CM

## 2021-06-13 DIAGNOSIS — I509 Heart failure, unspecified: Secondary | ICD-10-CM | POA: Diagnosis not present

## 2021-06-13 NOTE — Patient Instructions (Addendum)
Thank you for allowing the Chronic Care Management team to participate in your care.   Goal Addressed: Patient Care Plan: COPD (Adult)     Problem Identified: Symptom Exacerbation (COPD)      Long-Range Goal: Symptom Exacerbation Prevented or Minimized Completed 05/08/2021  Start Date: 01/15/2021  Expected End Date: 05/15/2021  Priority: High  Note:   Current Barriers:  Chronic Disease Management support and educational needs r/t COPD  Case Manager Clinical Goal(s): Over the next 120 days, patient will not require hospitalization for complications r/t COPD exacerbation  Interventions:  Collaboration with Birdie Sons, MD regarding development and update of comprehensive plan of care as evidenced by provider attestation and co-signature Inter-disciplinary care team collaboration (see longitudinal plan of care) Reviewed medications and current treatment plan. Reports doing very well with COPD self-management. Symptoms remain well controlled with prescribed inhalers. Denies cough. Continues to experience exertional dyspnea but denies symptoms at rest. Report doing very well in the home. Able to complete ADL's and tasks in the home without difficultly. Reports doing very well with COPD self- management Encouraged to continue daily symptom assessments. Reviewed worsening s/sx that require immediate medical attention.    Self-Care Activities/Patient Goals: -Take medications and utilize inhalers as prescribed -Assess symptoms daily and notify provider if in the yellow zone for 48 hrs without improvement -Avoid extreme temperatures and strenuous activites -Follow recommendations to prevent respiratory infection -Notify provider or care management team with questions and new concerns as needed   Goal Met    Patient Care Plan: Diabetes Type 2 (Adult)     Problem Identified: Glycemic Management (Diabetes, Type 2)      Long-Range Goal: Glycemic Management Optimized   Start Date:  06/13/2021  Expected End Date: 09/10/2021  Priority: High  Note:    Current Barriers:  Chronic disease management and support related to Diabetes self-management   Case Manager Clinical Goal(s):  Over the next 120 days, patient will demonstrate improved adherence to prescribed treatment plan for Diabetes self-management as evidenced by daily monitoring and recording of CBG, adherence to ADA/ carb modified diet and adherence to prescribed medication regimen.   Interventions:  Collaboration with Birdie Sons, MD regarding development and update of comprehensive plan of care as evidenced by provider attestation and co-signature Inter-disciplinary care team collaboration (see longitudinal plan of care) Reviewed medications and current treatment plan. Reports excellent compliance with medications. No concerns regarding medication management. Reviewed s/sx of hypoglycemia and hyperglycemia along with appropriate interventions. Reports receiving a new glucometer but being unable to set it up to function properly. Reports his insurance provider is aware. He will bring the device to the clinic if assistance is needed with initial set up. He has not monitored his fasting levels. Denies experiencing s/sx of hypoglycemia or hyperglycemia. Reports doing fairly well with maintaining a carb modified diet. Discussed recommended DM exams. Reports completing daily foot care as recommended. Denies recent changes. He is due for an annual eye exam. Will attempt to schedule within the next few weeks.   Patient Goals/Self-Care Activities Self-administer medications as prescribed Attend all scheduled provider appointments Monitor blood glucose levels consistently and utilize recommended interventions Adhere to prescribed ADA/carb modified Schedule annual eye exam Contact care management team if assistance is required with setting up the new glucometer Notify provider or care management team with questions and  new concerns as needed   Follow Up Plan:  Will follow up next month      Patient Care Plan: Heart Failure (Adult)  Problem Identified: Symptom Exacerbation (Heart Failure)      Long-Range Goal: Symptom Exacerbation Prevented or Minimized   Start Date: 06/13/2021  Expected End Date: 09/11/2021  Priority: High  Note:   Current Barriers:  Chronic Disease Management support and educational needs r/t CHF.  Case Manager Clinical Goal(s):  Over the next 120 days, patient will not require hospitalization or emergent care d/t complications r/t CHF exacerbation.  Interventions:  Collaboration with Birdie Sons, MD regarding development and update of comprehensive plan of care as evidenced by provider attestation and co-signature Inter-disciplinary care team collaboration (see longitudinal plan of care) Reviewed treatment plan and recommendations provided by the Cardiology team. Reports excellent compliance with medications. No concerns regarding medication management. Reviewed weight parameters and indications for notifying a provider. Reports weighing once a week. Reports weights have remained within range. Weight today was 214 lbs. Denies abdominal or lower extremity edema. Denies episodes of shortness of breath. No chest discomfort of palpitations. Reports staying indoors most days to avoid hot and humid weather. No changes in activity tolerance. Reviewed worsening s/sx and indications for seeking immediate medical attention.   Patient Goals/Self-Care Activities:  Take all medications as prescribed Attend scheduled medical appointments Monitor weight and record readings Adhere to recommended cardiac prudent/heart healthy diet Notify provider for weight gain outside of established parameters Contact provider or care management team with questions and new concerns as needed.   Follow Up Plan:  Will follow up next month            Lawrence [name] verbalized understanding  of the information discussed during the telephonic outreach. Declined need for mailed/printed instructions. A member of the care management team will follow up next month.   Lawrence [name] [facility] [phone]

## 2021-06-13 NOTE — Chronic Care Management (AMB) (Signed)
Chronic Care Management   CCM RN Visit Note  06/13/2021 Name: Lawrence [name] MRN: [id number] DOB: [birth year]  Subjective: Lawrence [name] is a 65 y.o. year old male who is a primary care patient of [name], [name], MD. The care management team was consulted for assistance with disease management and care coordination needs.    Engaged with patient by telephone for follow up visit in response to provider referral for case management and/or care coordination services.   Consent to Services:  The patient was given information about Chronic Care Management services, agreed to services, and gave verbal consent prior to initiation of services.  Please see initial visit note for detailed documentation.   Assessment: Review of patient past medical history, allergies, medications, health status, including review of consultants reports, laboratory and other test data, was performed as part of comprehensive evaluation and provision of chronic care management services.   SDOH (Social Determinants of Health) assessments and interventions performed: No  CCM Care Plan  Allergies  Allergen Reactions   Demerol  [Meperidine Hcl]    Demerol [Meperidine] Hives   Jardiance [Empagliflozin] Other (See Comments)    Perineal pain   Prednisone Other (See Comments) and Hypertension    Pt states that this medication puts him in A-fib    Sulfa Antibiotics Hives   Albuterol Sulfate [Albuterol] Palpitations and Other (See Comments)    Pt currently uses this medication.     Morphine Sulfate Nausea And Vomiting, Rash and Other (See Comments)    Pt states that he is only allergic to the tablet form of this medication.      Outpatient Encounter Medications as of 06/13/2021  Medication Sig Note   Accu-Chek Softclix Lancets lancets Use as instructed to check sugar daily for type 2 diabetes.    allopurinol (ZYLOPRIM) 300 MG tablet TAKE 1 TABLET BY MOUTH TWICE A DAY    ALPRAZolam (XANAX) 1 MG tablet Take 1 tablet  (1 mg total) by mouth 3 (three) times daily.    amLODipine (NORVASC) 2.5 MG tablet Take 1 tablet (2.5 mg total) by mouth daily.    aspirin 81 MG chewable tablet Chew 81 mg by mouth daily. Swallows whole    atorvastatin (LIPITOR) 80 MG tablet TAKE ONE TABLET BY MOUTH AT BEDTIME    Blood Glucose Calibration (ACCU-CHEK GUIDE CONTROL) LIQD Use with blood glucose monitor as directed    Blood Glucose Monitoring Suppl (ACCU-CHEK GUIDE) w/Device KIT Use to check blood sugars as directed    BREO ELLIPTA 100-25 MCG/INH AEPB INHALE 1 PUFF INTO THE LUNGS DAILY    cetirizine (ZYRTEC) 10 MG tablet TAKE ONE TABLET BY MOUTH AT BEDTIME    docusate sodium (COLACE) 100 MG capsule Take 100 mg by mouth daily as needed for mild constipation.    ELIQUIS 5 MG TABS tablet TAKE 1 TABLET BY MOUTH TWICE A DAY    FARXIGA 10 MG TABS tablet TAKE 1 TABLET BY MOUTH DAILY    glucose blood (ACCU-CHEK GUIDE) test strip Use as instructed to check sugar daily for type 2 diabetes.    isosorbide mononitrate (IMDUR) 120 MG 24 hr tablet TAKE 1 TABLET BY MOUTH DAILY    linaclotide (LINZESS) 72 MCG capsule Take 72 mcg by mouth daily before breakfast.     lisinopril (ZESTRIL) 10 MG tablet TAKE 1 TABLET BY MOUTH DAILY    metFORMIN (GLUCOPHAGE-XR) 500 MG 24 hr tablet Take 1 tablet (500 mg total) by mouth 2 (two) times daily.  methocarbamol (ROBAXIN) 500 MG tablet Take 1 tablet (500 mg total) by mouth every 8 (eight) hours as needed for muscle spasms.    naloxone (NARCAN) nasal spray 4 mg/0.1 mL 1 spray into nostril x1 and may repeat every 2-3 minutes until patient is responsive or EMS arrives    nitroGLYCERIN (NITROSTAT) 0.4 MG SL tablet PLACE ONE TABLET UNDER THE TONGUE EVERY 5 MINUTES FOR CHEST PAIN. IF NO RELIEF PAST 3RD TAB GO TO EMERGENCY ROOM    omega-3 acid ethyl esters (LOVAZA) 1 g capsule TAKE 4 CAPSULES BY MOUTH DAILY    omeprazole (PRILOSEC) 40 MG capsule Take 40 mg by mouth 2 (two) times daily.    oxyCODONE-acetaminophen  (PERCOCET) 10-325 MG tablet TAKE 1 TABLET BY MOUTH EVERY 4 HOURS AS NEEDED FOR PAIN    ranolazine (RANEXA) 1000 MG SR tablet Take 1 tablet (1,000 mg total) by mouth 2 (two) times daily.    sotalol (BETAPACE) 80 MG tablet Take 40 mg by mouth 2 (two) times daily.    tiotropium (SPIRIVA HANDIHALER) 18 MCG inhalation capsule Place 1 capsule (18 mcg total) into inhaler and inhale daily.    torsemide (DEMADEX) 100 MG tablet Take 0.5 tablets (50 mg total) by mouth daily. 01/15/2021: Reports taking as needed.   traZODone (DESYREL) 150 MG tablet TAKE 1 TABLET BY MOUTH AT BEDTIME    venlafaxine XR (EFFEXOR-XR) 75 MG 24 hr capsule TAKE 1 CAPSULE BY MOUTH DAILY WITH BREAKFAST    [DISCONTINUED] albuterol (VENTOLIN HFA) 108 (90 Base) MCG/ACT inhaler INHALE 2 PUFFS BY MOUTH EVERY 6 HOURS AS NEEDED FOR SHORTNESS OF BREATH (Patient not taking: No sig reported)    No facility-administered encounter medications on file as of 06/13/2021.    Patient Active Problem List   Diagnosis Date Noted   Chronic diastolic heart failure ([place])    Hypertensive urgency 04/27/2021   Atherosclerosis of aorta ([place]) 12/16/2020   Syncope 12/11/2020   Acidosis 12/11/2020   Polycythemia 10/05/2020   NSTEMI (non-ST elevated myocardial infarction) ([place]) 09/26/2020   Primary insomnia 05/13/2020   Recurrent major depressive disorder, remission status unspecified ([place]) 03/29/2020   Elevated troponin 03/07/2020   Bereavement 09/09/2019   Chronic respiratory failure with hypoxia ([place]) 05/29/2019   Morbid obesity ([place]) 05/29/2019   Trigger thumb of right hand 11/28/2018   Eye pain, left 08/18/2018   Acute on chronic heart failure ([place]) 04/18/2018   History of adenomatous polyp of colon 04/05/2018   Abdominal pain, chronic, epigastric 11/06/2017   Bilateral flank pain 03/24/2017   Dyspnea 04/03/2016   Hypotension 04/03/2016   CKD (chronic kidney disease) stage 2, GFR 60-89 ml/min 04/03/2016   Anemia 04/03/2016   Atrial fibrillation  ([place]) 12/23/2015   OSA (obstructive sleep apnea) 12/10/2015   Left inguinal hernia 11/07/2015   Anxiety 11/07/2015   Unstable angina ([place]) 10/05/2015   Back pain with left-sided radiculopathy 09/30/2015   Nocturnal hypoxia 09/06/2015   BPH (benign prostatic hyperplasia) 93/23/5573   Chronic systolic CHF (congestive heart failure) (HCC)    Angina pectoris ([place])    Chest pain 07/11/2015   COPD (chronic obstructive pulmonary disease) ([place]) 07/03/2015   CAD (coronary artery disease) 06/26/2015   Uncontrolled hypertension 06/26/2015   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[place]) 06/26/2015   Achalasia 07/24/2014   GERD (gastroesophageal reflux disease) 06/07/2014   Former tobacco use 04/11/2013   HLD (hyperlipidemia) 04/09/2013    Conditions to be addressed/monitored:CHF, COPD, and DMII  Patient Care Plan: COPD (Adult)     Problem  Identified: Symptom Exacerbation (COPD)      Long-Range Goal: Symptom Exacerbation Prevented or Minimized Completed 05/08/2021  Start Date: 01/15/2021  Expected End Date: 05/15/2021  Priority: High  Note:   Current Barriers:  Chronic Disease Management support and educational needs r/t COPD  Case Manager Clinical Goal(s): Over the next 120 days, patient will not require hospitalization for complications r/t COPD exacerbation  Interventions:  Collaboration with Birdie Sons, MD regarding development and update of comprehensive plan of care as evidenced by provider attestation and co-signature Inter-disciplinary care team collaboration (see longitudinal plan of care) Reviewed medications and current treatment plan. Reports doing very well with COPD self-management. Symptoms remain well controlled with prescribed inhalers. Denies cough. Continues to experience exertional dyspnea but denies symptoms at rest. Report doing very well in the home. Able to complete ADL's and tasks in the home without difficultly. Reports doing very well with COPD self-  management Encouraged to continue daily symptom assessments. Reviewed worsening s/sx that require immediate medical attention.    Self-Care Activities/Patient Goals: -Take medications and utilize inhalers as prescribed -Assess symptoms daily and notify provider if in the yellow zone for 48 hrs without improvement -Avoid extreme temperatures and strenuous activites -Follow recommendations to prevent respiratory infection -Notify provider or care management team with questions and new concerns as needed   Goal Met    Patient Care Plan: Diabetes Type 2 (Adult)     Problem Identified: Glycemic Management (Diabetes, Type 2)      Long-Range Goal: Glycemic Management Optimized   Start Date: 06/13/2021  Expected End Date: 09/10/2021  Priority: High  Note:    Current Barriers:  Chronic disease management and support related to Diabetes self-management   Case Manager Clinical Goal(s):  Over the next 120 days, patient will demonstrate improved adherence to prescribed treatment plan for Diabetes self-management as evidenced by daily monitoring and recording of CBG, adherence to ADA/ carb modified diet and adherence to prescribed medication regimen.   Interventions:  Collaboration with Birdie Sons, MD regarding development and update of comprehensive plan of care as evidenced by provider attestation and co-signature Inter-disciplinary care team collaboration (see longitudinal plan of care) Reviewed medications and current treatment plan. Reports excellent compliance with medications. No concerns regarding medication management. Reviewed s/sx of hypoglycemia and hyperglycemia along with appropriate interventions. Reports receiving a new glucometer but being unable to set it up to function properly. Reports his insurance provider is aware. He will bring the device to the clinic if assistance is needed with initial set up. He has not monitored his fasting levels. Denies experiencing s/sx  of hypoglycemia or hyperglycemia. Reports doing fairly well with maintaining a carb modified diet. Discussed recommended DM exams. Reports completing daily foot care as recommended. Denies recent changes. He is due for an annual eye exam. Will attempt to schedule within the next few weeks.   Patient Goals/Self-Care Activities Self-administer medications as prescribed Attend all scheduled provider appointments Monitor blood glucose levels consistently and utilize recommended interventions Adhere to prescribed ADA/carb modified Schedule annual eye exam Contact care management team if assistance is required with setting up the new glucometer Notify provider or care management team with questions and new concerns as needed   Follow Up Plan:  Will follow up next month      Patient Care Plan: Heart Failure (Adult)     Problem Identified: Symptom Exacerbation (Heart Failure)      Long-Range Goal: Symptom Exacerbation Prevented or Minimized   Start Date: 06/13/2021  Expected End Date: 09/11/2021  Priority: High  Note:   Current Barriers:  Chronic Disease Management support and educational needs r/t CHF.  Case Manager Clinical Goal(s):  Over the next 120 days, patient will not require hospitalization or emergent care d/t complications r/t CHF exacerbation.  Interventions:  Collaboration with Birdie Sons, MD regarding development and update of comprehensive plan of care as evidenced by provider attestation and co-signature Inter-disciplinary care team collaboration (see longitudinal plan of care) Reviewed treatment plan and recommendations provided by the Cardiology team. Reports excellent compliance with medications. No concerns regarding medication management. Reviewed weight parameters and indications for notifying a provider. Reports weighing once a week. Reports weights have remained within range. Weight today was 214 lbs. Denies abdominal or lower extremity edema. Denies  episodes of shortness of breath. No chest discomfort of palpitations. Reports staying indoors most days to avoid hot and humid weather. No changes in activity tolerance. Reviewed worsening s/sx and indications for seeking immediate medical attention.   Patient Goals/Self-Care Activities:  Take all medications as prescribed Attend scheduled medical appointments Monitor weight and record readings Adhere to recommended cardiac prudent/heart healthy diet Notify provider for weight gain outside of established parameters Contact provider or care management team with questions and new concerns as needed.   Follow Up Plan:  Will follow up next month              PLAN: A member of the care management team will follow up next month.   [facility] Care Management [facility] [phone]

## 2021-06-23 ENCOUNTER — Other Ambulatory Visit: Payer: Self-pay | Admitting: Family Medicine

## 2021-06-23 DIAGNOSIS — E1121 Type 2 diabetes mellitus with diabetic nephropathy: Secondary | ICD-10-CM

## 2021-06-23 NOTE — Telephone Encounter (Signed)
Notes to clinic: Medication last filled on 04/30/2021 for 30 day Review for continued use and refill Patient has appt on 08/11/2021   Requested Prescriptions  Pending Prescriptions Disp Refills   FARXIGA 10 MG TABS tablet [Pharmacy Med Name: FARXIGA 10 MG TAB] 30 tablet 1    Sig: TAKE 1 TABLET BY MOUTH DAILY      Endocrinology:  Diabetes - SGLT2 Inhibitors Passed - 06/23/2021  9:04 AM      Passed - Cr in normal range and within 360 days    Creatinine  Date Value Ref Range Status  03/07/2015 0.84 mg/dL Final    Comment:    0.61-1.24 NOTE: New Reference Range  01/22/15    Creatinine, Ser  Date Value Ref Range Status  04/30/2021 1.12 0.61 - 1.24 mg/dL Final   Creatinine, POC  Date Value Ref Range Status  10/27/2016 n/a mg/dL Final          Passed - LDL in normal range and within 360 days    Ldl Cholesterol, Calc  Date Value Ref Range Status  05/23/2014 42 0 - 100 mg/dL Final   LDL Calculated  Date Value Ref Range Status  01/10/2018 56 0 - 99 mg/dL Final   LDL Cholesterol  Date Value Ref Range Status  04/27/2021 35 0 - 99 mg/dL Final    Comment:           Total Cholesterol/HDL:CHD Risk Coronary Heart Disease Risk Table                     Men   Women  1/2 Average Risk   3.4   3.3  Average Risk       5.0   4.4  2 X Average Risk   9.6   7.1  3 X Average Risk  23.4   11.0        Use the calculated Patient Ratio above and the CHD Risk Table to determine the patient's CHD Risk.        ATP III CLASSIFICATION (LDL):  <100     mg/dL   Optimal  100-129  mg/dL   Near or Above                    Optimal  130-159  mg/dL   Borderline  160-189  mg/dL   High  >190     mg/dL   Very High Performed at [facility], [name] [place], [place] [address]    Direct LDL  Date Value Ref Range Status  09/27/2020 122.1 (H) 0 - 99 mg/dL Final    Comment:    Performed at [facility] [address], [place], [place] [address]          Passed - HBA1C is  between 0 and 7.9 and within 180 days    Hemoglobin A1C  Date Value Ref Range Status  10/15/2018 6.8  Final   Hgb A1c MFr Bld  Date Value Ref Range Status  04/27/2021 7.4 (H) 4.8 - 5.6 % Final    Comment:    (NOTE)         Prediabetes: 5.7 - 6.4         Diabetes: >6.4         Glycemic control for adults with diabetes: <7.0    A1c  Date Value Ref Range Status  02/19/2016 6.5  Final          Passed - eGFR  in normal range and within 360 days    EGFR (African American)  Date Value Ref Range Status  03/07/2015 >60  Final   GFR calc Af Amer  Date Value Ref Range Status  09/24/2020 97 >59 mL/min/1.73 Final    Comment:    **In accordance with recommendations from the NKF-ASN Task force,**   [facility] is in the process of updating its eGFR calculation to the   2021 CKD-EPI creatinine equation that estimates kidney function   without a race variable.    EGFR (Non-African Amer.)  Date Value Ref Range Status  03/07/2015 >60  Final    Comment:    eGFR values <35m/min/1.73 m2 may be an indication of chronic kidney disease (CKD). Calculated eGFR is useful in patients with stable renal function. The eGFR calculation will not be reliable in acutely ill patients when serum creatinine is changing rapidly. It is not useful in patients on dialysis. The eGFR calculation may not be applicable to patients at the low and high extremes of body sizes, pregnant women, and vegetarians.    GFR, Estimated  Date Value Ref Range Status  04/30/2021 >60 >60 mL/min Final    Comment:    (NOTE) Calculated using the CKD-EPI Creatinine Equation (2021)           Passed - Valid encounter within last 6 months    Recent Outpatient Visits           3 months ago Type 2 diabetes mellitus with diabetic nephropathy, without long-term current use of insulin ([place]   [facility]   6 months ago Type 2 diabetes mellitus with diabetic nephropathy, without long-term  current use of insulin ([place]   [facility]   8 months ago [facility] wall [facility]   9 months ago Type 2 diabetes mellitus with diabetic nephropathy, without long-term current use of insulin ([facility]   [facility]   1 year ago Type 2 diabetes mellitus with diabetic nephropathy, without long-term current use of insulin ([facility]   [facility]       Future Appointments             In 1 month [name], [facility]Kirstie Peri MD [facility] [place]  In 1 month KRalene Bathe MD [place]

## 2021-06-28 DIAGNOSIS — J449 Chronic obstructive pulmonary disease, unspecified: Secondary | ICD-10-CM | POA: Diagnosis not present

## 2021-06-28 DIAGNOSIS — R918 Other nonspecific abnormal finding of lung field: Secondary | ICD-10-CM | POA: Diagnosis not present

## 2021-06-28 DIAGNOSIS — R0602 Shortness of breath: Secondary | ICD-10-CM | POA: Diagnosis not present

## 2021-06-28 DIAGNOSIS — Z7901 Long term (current) use of anticoagulants: Secondary | ICD-10-CM | POA: Diagnosis not present

## 2021-06-28 DIAGNOSIS — I2510 Atherosclerotic heart disease of native coronary artery without angina pectoris: Secondary | ICD-10-CM | POA: Diagnosis not present

## 2021-06-28 DIAGNOSIS — I5032 Chronic diastolic (congestive) heart failure: Secondary | ICD-10-CM | POA: Diagnosis not present

## 2021-06-28 DIAGNOSIS — I4891 Unspecified atrial fibrillation: Secondary | ICD-10-CM | POA: Diagnosis not present

## 2021-06-28 DIAGNOSIS — R0789 Other chest pain: Secondary | ICD-10-CM | POA: Diagnosis not present

## 2021-06-28 DIAGNOSIS — E785 Hyperlipidemia, unspecified: Secondary | ICD-10-CM | POA: Diagnosis not present

## 2021-06-28 DIAGNOSIS — I110 Hypertensive heart disease with heart failure: Secondary | ICD-10-CM | POA: Diagnosis not present

## 2021-06-28 DIAGNOSIS — E119 Type 2 diabetes mellitus without complications: Secondary | ICD-10-CM | POA: Diagnosis not present

## 2021-06-28 DIAGNOSIS — R079 Chest pain, unspecified: Secondary | ICD-10-CM | POA: Diagnosis not present

## 2021-06-29 DIAGNOSIS — R079 Chest pain, unspecified: Secondary | ICD-10-CM | POA: Diagnosis not present

## 2021-07-03 DIAGNOSIS — J449 Chronic obstructive pulmonary disease, unspecified: Secondary | ICD-10-CM | POA: Diagnosis not present

## 2021-07-04 ENCOUNTER — Ambulatory Visit (INDEPENDENT_AMBULATORY_CARE_PROVIDER_SITE_OTHER): Payer: Medicare HMO

## 2021-07-04 ENCOUNTER — Other Ambulatory Visit: Payer: Self-pay | Admitting: Family Medicine

## 2021-07-04 DIAGNOSIS — I480 Paroxysmal atrial fibrillation: Secondary | ICD-10-CM

## 2021-07-04 DIAGNOSIS — M5410 Radiculopathy, site unspecified: Secondary | ICD-10-CM

## 2021-07-04 DIAGNOSIS — E1121 Type 2 diabetes mellitus with diabetic nephropathy: Secondary | ICD-10-CM

## 2021-07-04 DIAGNOSIS — I10 Essential (primary) hypertension: Secondary | ICD-10-CM

## 2021-07-04 DIAGNOSIS — I509 Heart failure, unspecified: Secondary | ICD-10-CM

## 2021-07-04 NOTE — Telephone Encounter (Signed)
  Requested medication (s) are on the active medication list:  yes   Last refill:  06/06/2021  Future visit scheduled:  yes  Notes to clinic:  this refill cannot be delegated    Requested Prescriptions  Pending Prescriptions Disp Refills   oxyCODONE-acetaminophen (PERCOCET) 10-325 MG tablet [Pharmacy Med Name: OXYCODONE-APAP 10-325 MG TAB] 180 tablet     Sig: TAKE 1 TABLET BY MOUTH EVERY 4 HOURS AS NEEDED FOR PAIN     Not Delegated - Analgesics:  Opioid Agonist Combinations Failed - 07/04/2021  3:06 PM      Failed - This refill cannot be delegated      Passed - Urine Drug Screen completed in last 360 days      Passed - Valid encounter within last 6 months    Recent Outpatient Visits           3 months ago Type 2 diabetes mellitus with diabetic nephropathy, without long-term current use of insulin ([place])   [facility] Birdie Sons, MD   6 months ago Type 2 diabetes mellitus with diabetic nephropathy, without long-term current use of insulin ([place])   [facility] Birdie Sons, MD   8 months ago Chest wall pain   [facility] Birdie Sons, MD   9 months ago Type 2 diabetes mellitus with diabetic nephropathy, without long-term current use of insulin ([place])   [facility] Birdie Sons, MD   1 year ago Type 2 diabetes mellitus with diabetic nephropathy, without long-term current use of insulin [facility])   [facility] Birdie Sons, MD       Future Appointments             In 1 month [name], [name], MD [facility] [place]   In 1 month Ralene Bathe, MD [place]

## 2021-07-04 NOTE — Chronic Care Management (AMB) (Signed)
Chronic Care Management   CCM RN Visit Note  07/04/2021 Name: Lawrence [name] MRN: [id number] DOB: 21-Feb-1956  Subjective: Lawrence [name] is a 65 y.o. year old male who is a primary care patient of [name], [name], MD. The care management team was consulted for assistance with disease management and care coordination needs.    Engaged with patient by telephone for follow up visit in response to provider referral for case management and care coordination services.   Consent to Services:  The patient was given information about Chronic Care Management services, agreed to services, and gave verbal consent prior to initiation of services.  Please see initial visit note for detailed documentation.    Assessment: Review of patient past medical history, allergies, medications, health status, including review of consultants reports, laboratory and other test data, was performed as part of comprehensive evaluation and provision of chronic care management services.   SDOH (Social Determinants of Health) assessments and interventions performed:  No  CCM Care Plan  Allergies  Allergen Reactions   Demerol  [Meperidine Hcl]    Demerol [Meperidine] Hives   Jardiance [Empagliflozin] Other (See Comments)    Perineal pain   Prednisone Other (See Comments) and Hypertension    Pt states that this medication puts him in A-fib    Sulfa Antibiotics Hives   Albuterol Sulfate [Albuterol] Palpitations and Other (See Comments)    Pt currently uses this medication.     Morphine Sulfate Nausea And Vomiting, Rash and Other (See Comments)    Pt states that he is only allergic to the tablet form of this medication.      Outpatient Encounter Medications as of 07/04/2021  Medication Sig Note   Accu-Chek Softclix Lancets lancets Use as instructed to check sugar daily for type 2 diabetes.    allopurinol (ZYLOPRIM) 300 MG tablet TAKE 1 TABLET BY MOUTH TWICE A DAY    ALPRAZolam (XANAX) 1 MG tablet Take 1 tablet  (1 mg total) by mouth 3 (three) times daily.    amLODipine (NORVASC) 2.5 MG tablet Take 1 tablet (2.5 mg total) by mouth daily.    aspirin 81 MG chewable tablet Chew 81 mg by mouth daily. Swallows whole    atorvastatin (LIPITOR) 80 MG tablet TAKE ONE TABLET BY MOUTH AT BEDTIME    Blood Glucose Calibration (ACCU-CHEK GUIDE CONTROL) LIQD Use with blood glucose monitor as directed    Blood Glucose Monitoring Suppl (ACCU-CHEK GUIDE) w/Device KIT Use to check blood sugars as directed    BREO ELLIPTA 100-25 MCG/INH AEPB INHALE 1 PUFF INTO THE LUNGS DAILY    cetirizine (ZYRTEC) 10 MG tablet TAKE ONE TABLET BY MOUTH AT BEDTIME    docusate sodium (COLACE) 100 MG capsule Take 100 mg by mouth daily as needed for mild constipation.    ELIQUIS 5 MG TABS tablet TAKE 1 TABLET BY MOUTH TWICE A DAY    FARXIGA 10 MG TABS tablet TAKE 1 TABLET BY MOUTH DAILY    glucose blood (ACCU-CHEK GUIDE) test strip Use as instructed to check sugar daily for type 2 diabetes.    isosorbide mononitrate (IMDUR) 120 MG 24 hr tablet TAKE 1 TABLET BY MOUTH DAILY    linaclotide (LINZESS) 72 MCG capsule Take 72 mcg by mouth daily before breakfast.     lisinopril (ZESTRIL) 10 MG tablet TAKE 1 TABLET BY MOUTH DAILY    metFORMIN (GLUCOPHAGE-XR) 500 MG 24 hr tablet Take 1 tablet (500 mg total) by mouth 2 (two) times daily.  methocarbamol (ROBAXIN) 500 MG tablet Take 1 tablet (500 mg total) by mouth every 8 (eight) hours as needed for muscle spasms.    naloxone (NARCAN) nasal spray 4 mg/0.1 mL 1 spray into nostril x1 and may repeat every 2-3 minutes until patient is responsive or EMS arrives    nitroGLYCERIN (NITROSTAT) 0.4 MG SL tablet PLACE ONE TABLET UNDER THE TONGUE EVERY 5 MINUTES FOR CHEST PAIN. IF NO RELIEF PAST 3RD TAB GO TO EMERGENCY ROOM    omega-3 acid ethyl esters (LOVAZA) 1 g capsule TAKE 4 CAPSULES BY MOUTH DAILY    omeprazole (PRILOSEC) 40 MG capsule Take 40 mg by mouth 2 (two) times daily.    oxyCODONE-acetaminophen  (PERCOCET) 10-325 MG tablet TAKE 1 TABLET BY MOUTH EVERY 4 HOURS AS NEEDED FOR PAIN    ranolazine (RANEXA) 1000 MG SR tablet Take 1 tablet (1,000 mg total) by mouth 2 (two) times daily.    sotalol (BETAPACE) 80 MG tablet Take 40 mg by mouth 2 (two) times daily.    tiotropium (SPIRIVA HANDIHALER) 18 MCG inhalation capsule Place 1 capsule (18 mcg total) into inhaler and inhale daily.    torsemide (DEMADEX) 100 MG tablet Take 0.5 tablets (50 mg total) by mouth daily. 01/15/2021: Reports taking as needed.   traZODone (DESYREL) 150 MG tablet TAKE 1 TABLET BY MOUTH AT BEDTIME    venlafaxine XR (EFFEXOR-XR) 75 MG 24 hr capsule TAKE 1 CAPSULE BY MOUTH DAILY WITH BREAKFAST    [DISCONTINUED] albuterol (VENTOLIN HFA) 108 (90 Base) MCG/ACT inhaler INHALE 2 PUFFS BY MOUTH EVERY 6 HOURS AS NEEDED FOR SHORTNESS OF BREATH (Patient not taking: No sig reported)    No facility-administered encounter medications on file as of 07/04/2021.    Patient Active Problem List   Diagnosis Date Noted   Chronic diastolic heart failure ([place])    Hypertensive urgency 04/27/2021   Atherosclerosis of aorta ([place]) 12/16/2020   Syncope 12/11/2020   Acidosis 12/11/2020   Polycythemia 10/05/2020   NSTEMI (non-ST elevated myocardial infarction) ([place]) 09/26/2020   Primary insomnia 05/13/2020   Recurrent major depressive disorder, remission status unspecified ([place]) 03/29/2020   Elevated troponin 03/07/2020   Bereavement 09/09/2019   Chronic respiratory failure with hypoxia ([place]) 05/29/2019   Morbid obesity ([place]) 05/29/2019   Trigger thumb of right hand 11/28/2018   Eye pain, left 08/18/2018   Acute on chronic heart failure ([place]) 04/18/2018   History of adenomatous polyp of colon 04/05/2018   Abdominal pain, chronic, epigastric 11/06/2017   Bilateral flank pain 03/24/2017   Dyspnea 04/03/2016   Hypotension 04/03/2016   CKD (chronic kidney disease) stage 2, GFR 60-89 ml/min 04/03/2016   Anemia 04/03/2016   Atrial fibrillation  ([place]) 12/23/2015   OSA (obstructive sleep apnea) 12/10/2015   Left inguinal hernia 11/07/2015   Anxiety 11/07/2015   Unstable angina ([place]) 10/05/2015   Back pain with left-sided radiculopathy 09/30/2015   Nocturnal hypoxia 09/06/2015   BPH (benign prostatic hyperplasia) 16/08/9603   Chronic systolic CHF (congestive heart failure) (HCC)    Angina pectoris ([place])    Chest pain 07/11/2015   COPD (chronic obstructive pulmonary disease) ([place]) 07/03/2015   CAD (coronary artery disease) 06/26/2015   Uncontrolled hypertension 06/26/2015   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[place]) 06/26/2015   Achalasia 07/24/2014   GERD (gastroesophageal reflux disease) 06/07/2014   Former tobacco use 04/11/2013   HLD (hyperlipidemia) 04/09/2013    Conditions to be addressed/monitored:Atrial Fibrillation, CHF, HTN, and DMII  Patient Care Plan: Diabetes Type 2 (Adult)  Problem Identified: Glycemic Management (Diabetes, Type 2)      Long-Range Goal: Glycemic Management Optimized   Start Date: 06/13/2021  Expected End Date: 09/10/2021  Priority: High  Note:    Current Barriers:  Chronic disease management and support related to Diabetes self-management   Case Manager Clinical Goal(s):  Over the next 120 days, patient will demonstrate improved adherence to prescribed treatment plan for Diabetes self-management as evidenced by daily monitoring and recording of CBG, adherence to ADA/ carb modified diet and adherence to prescribed medication regimen.   Interventions:  Collaboration with Birdie Sons, MD regarding development and update of comprehensive plan of care as evidenced by provider attestation and co-signature Inter-disciplinary care team collaboration (see longitudinal plan of care) Reviewed medications and current treatment plan. Reports excellent compliance with medications. Reviewed s/sx of hypoglycemia and hyperglycemia along with appropriate interventions. Reports not  monitoring his blood glucose consistently d/t his device. Denies experiencing symptoms of hypoglycemia or hyperglycemia. Advised to notify his insurance provider regarding his glucometer and notify the clinic if a prescription for a new device is needed.    Patient Goals/Self-Care Activities Self-administer medications as prescribed Attend all scheduled provider appointments Monitor blood glucose levels consistently and utilize recommended interventions Adhere to prescribed ADA/carb modified Contact insurance provider regarding medical device/glucometer not functioning properly Update the clinic if a prescription for a new device is needed Notify provider or care management team with questions and new concerns as needed   Follow Up Plan:  Will follow up next month      Patient Care Plan: Heart Failure, A-Fib, HTN     Problem Identified: CHF, A-Fib and HTN      Long-Range Goal: Symptom Exacerbation Prevented or Minimized   Start Date: 06/13/2021  Expected End Date: 09/11/2021  Priority: High  Note:   Current Barriers:  Chronic Disease Management support and educational needs r/t CHF.  Case Manager Clinical Goal(s):  Over the next 120 days, patient will not require hospitalization or emergent care d/t complications r/t CHF exacerbation.  Interventions:  Collaboration with Birdie Sons, MD regarding development and update of comprehensive plan of care as evidenced by provider attestation and co-signature Inter-disciplinary care team collaboration (see longitudinal plan of care) Reviewed treatment plan and recommendations provided by the Cardiology team. Continues to take medications as prescribed.  Discussed recent evaluation in the Emergency Department. Evaluation indicated symptoms were related to A-Fib. Reports feeling well today. Denies episodes of chest pain, palpitations or shortness of breath since being evaluated. Reports activity tolerance has returned to baseline.   Reviewed weight parameters and indications for notifying a provider Reviewed worsening s/sx and indications for seeking immediate medical attention.   Patient Goals/Self-Care Activities:  Take all medications as prescribed Attend scheduled medical appointments Monitor weight and record readings Adhere to recommended cardiac prudent/heart healthy diet Notify provider for weight gain outside of established parameters Contact provider or care management team with questions and new concerns as needed.   Follow Up Plan:  Will follow up next month           PLAN A member of the care management team will follow up next month.    Cristy [facility] Care Management [facility] [phone]

## 2021-07-07 ENCOUNTER — Other Ambulatory Visit: Payer: Self-pay | Admitting: Family Medicine

## 2021-07-07 DIAGNOSIS — R0789 Other chest pain: Secondary | ICD-10-CM

## 2021-07-07 IMAGING — CR DG CHEST 2V
1 series · 2 of 2 positions shown · non-contrast
Comparison: Chest radiograph 06/10/2019

CLINICAL DATA: Pt here for abscess to right forearm. Just distal of
elbow is red swollen area that is warm to the touch. Pt also c/o
fluttering/funny feeling in left chest that has been there for 3
days. No SHOB or cough.

EXAM:
CHEST - 2 VIEW

[Series 1: w chest pa · 0.14mm/px · 2 of 2 slices shown]
[im 1/2]
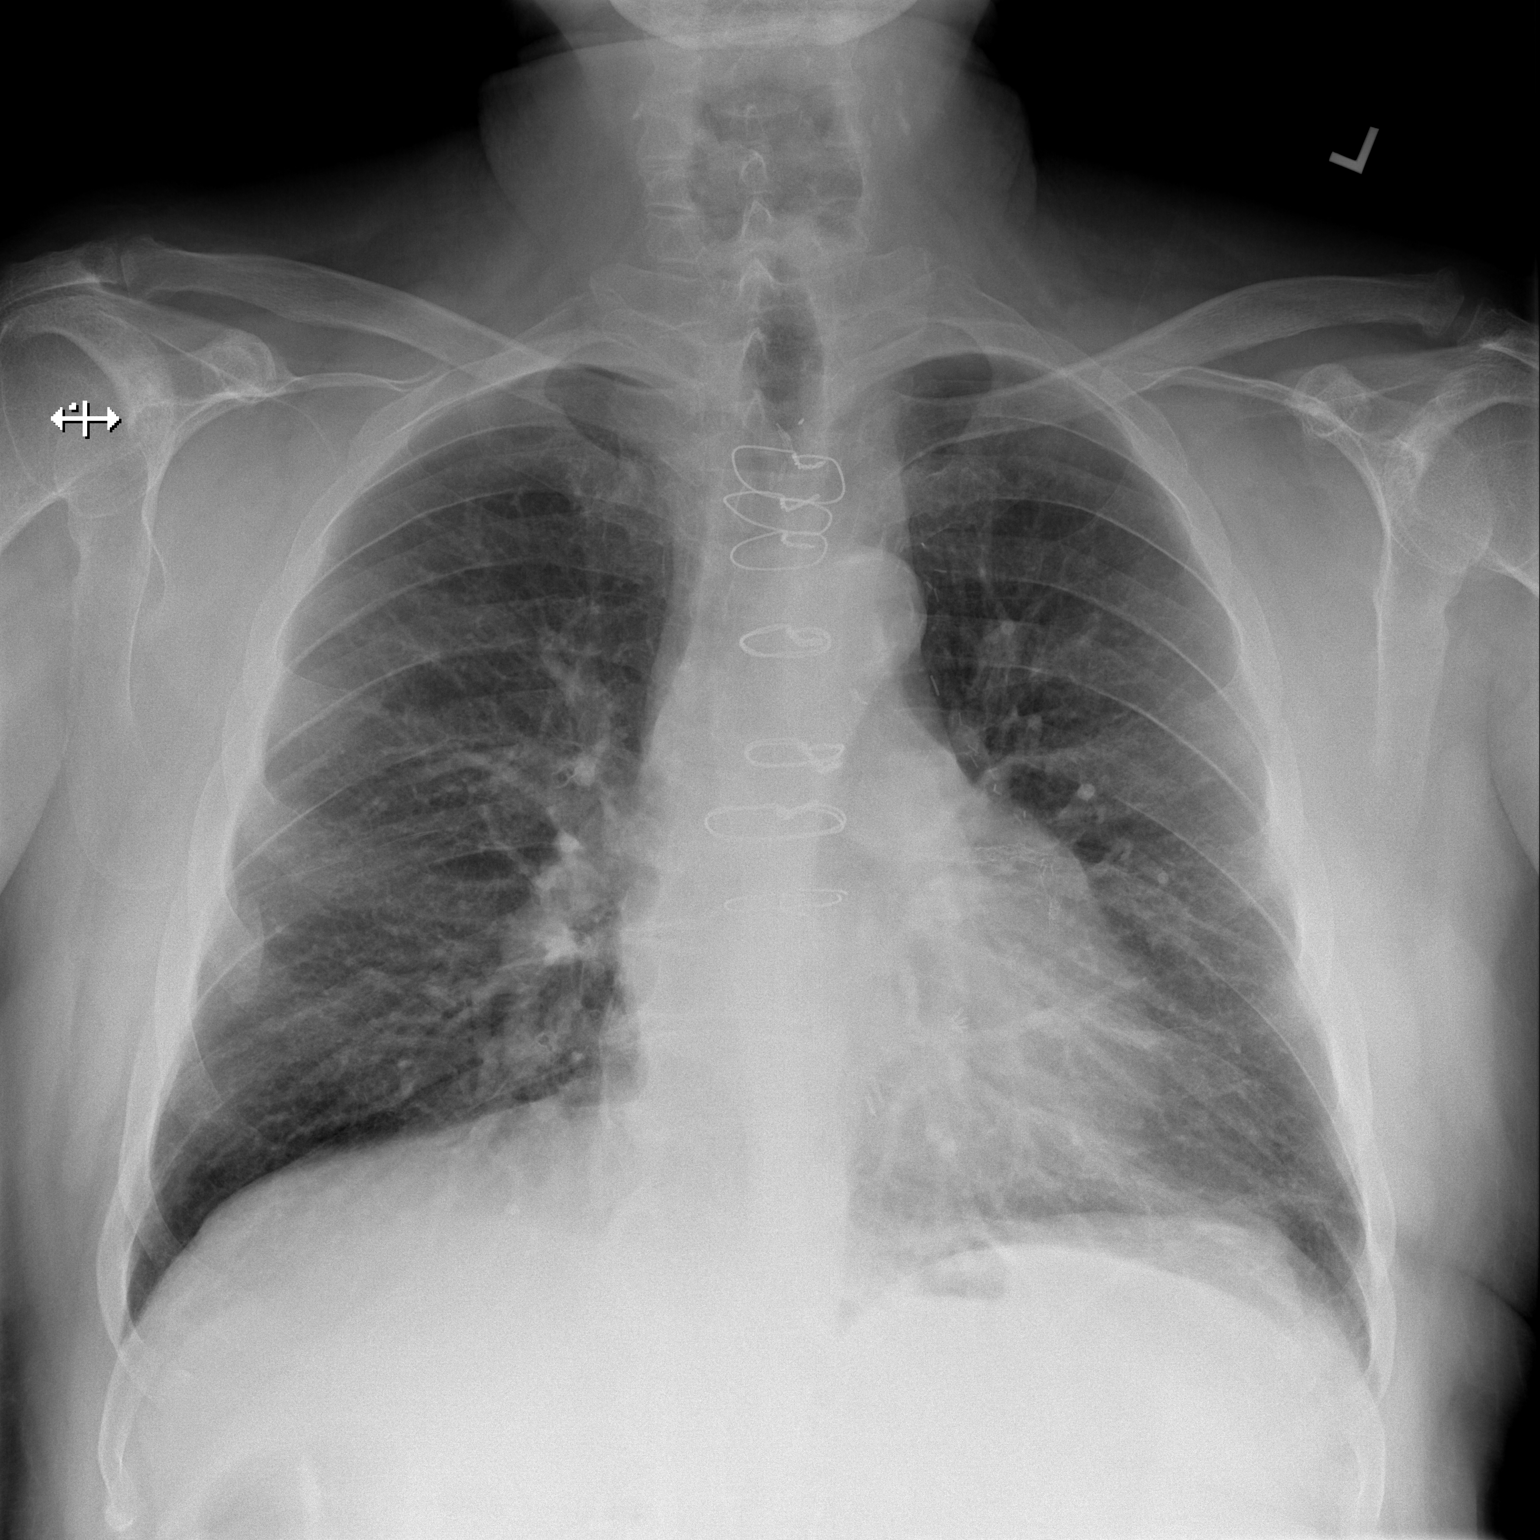
[im 2/2]
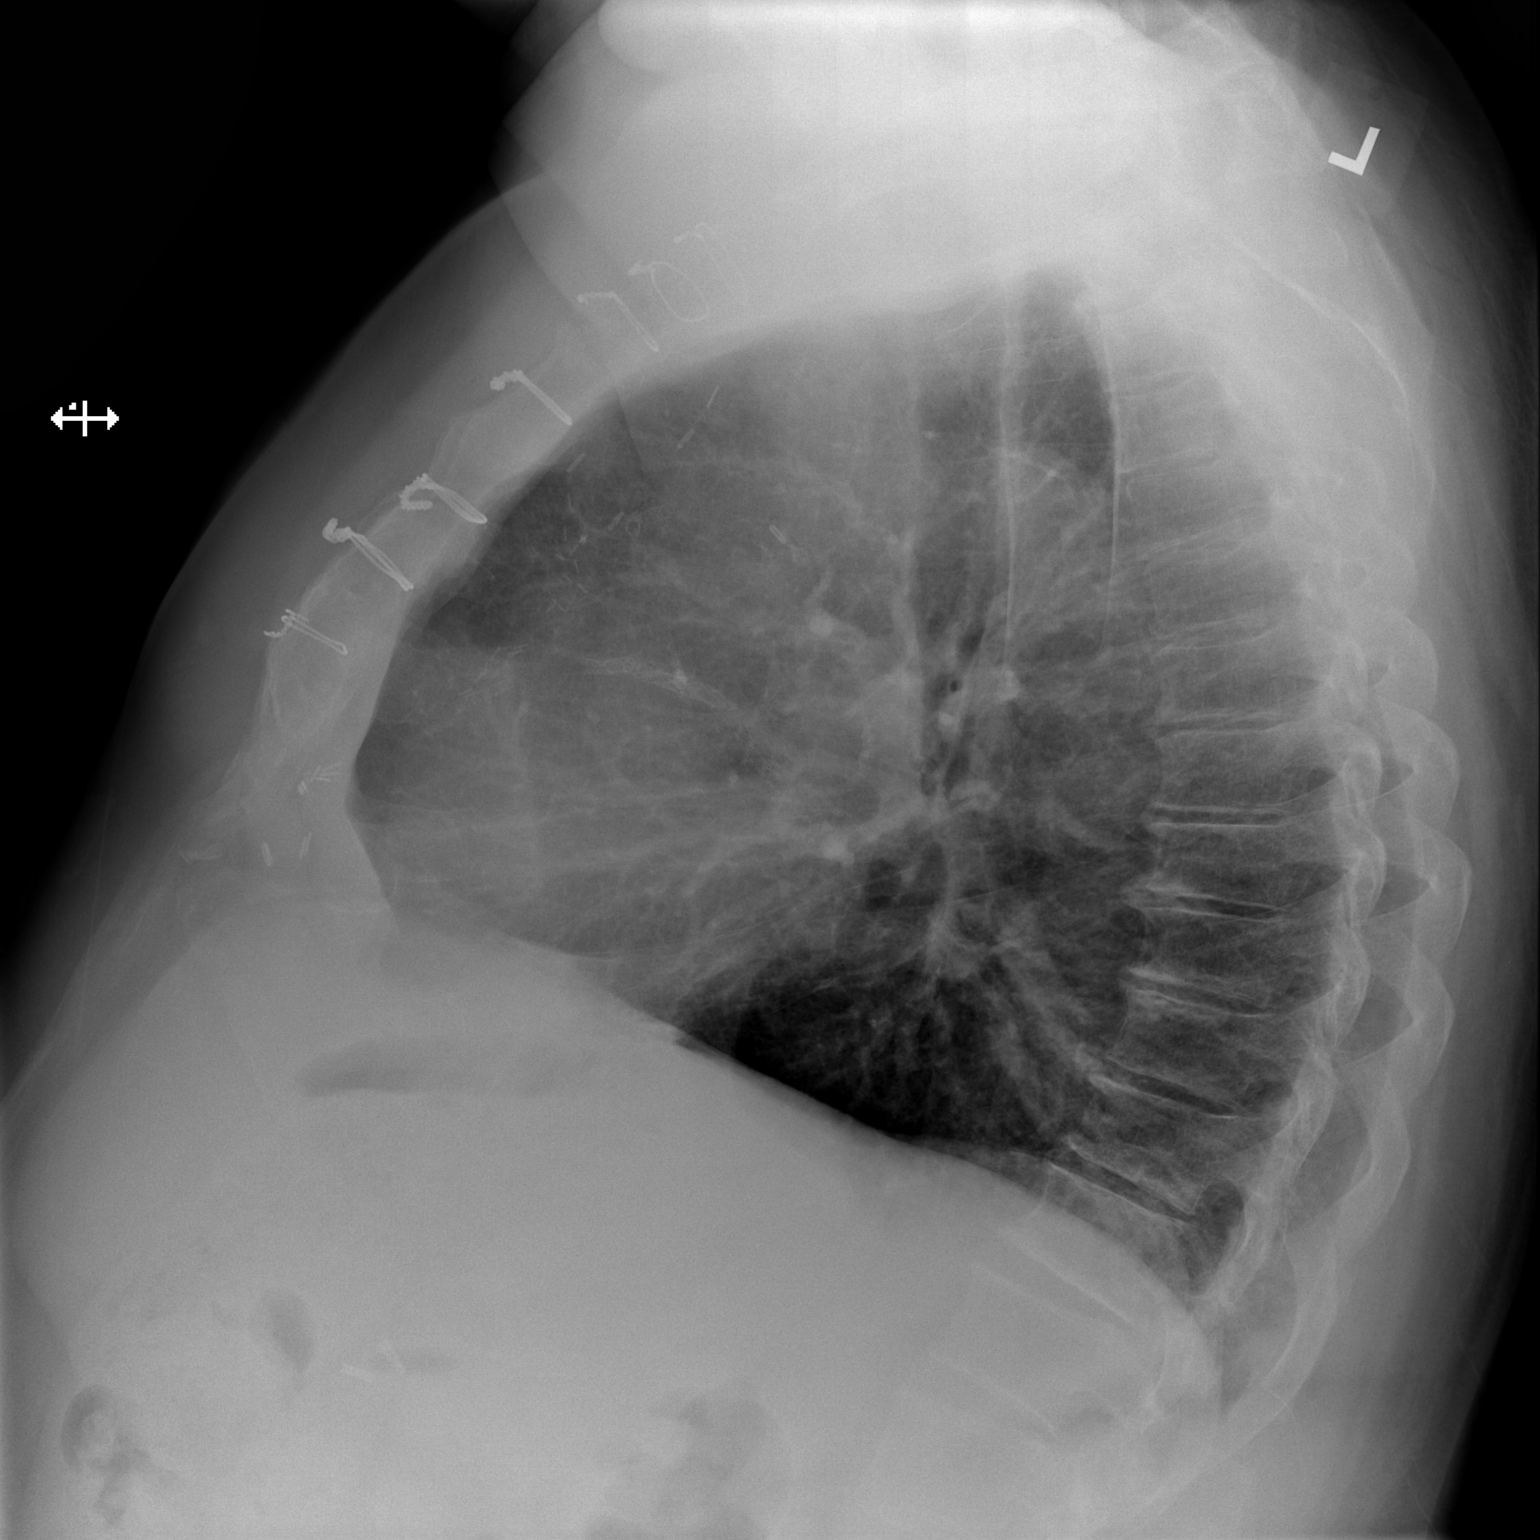

[2 of 2 positions shown; findings below may reference images not displayed]

FINDINGS: Stable cardiomediastinal contours status post median sternotomy.
Mild central vascular congestion. Chronic diffuse interstitial
coarsening. Minimal scattered opacities likely atelectasis or
scarring. No new focal infiltrate. No pneumothorax or pleural
effusion. No acute finding in the visualized skeleton.
IMPRESSION: No evidence of active disease in the chest. Chronic bronchitic
changes and scattered atelectasis.

## 2021-07-07 NOTE — Telephone Encounter (Signed)
Requested medication (s) are due for refill today: no  Requested medication (s) are on the active medication list: yes   Last refill:  05/07/2021  Future visit scheduled:  yes  Notes to clinic:  this refill cannot be delegated    Requested Prescriptions  Pending Prescriptions Disp Refills   methocarbamol (ROBAXIN) 500 MG tablet [Pharmacy Med Name: METHOCARBAMOL 500 MG TAB] 90 tablet 1    Sig: TAKE 1 TABLET BY MOUTH EVERY 8 HOURS AS NEEDED FOR MUSCLE SPASMS     Not Delegated - Analgesics:  Muscle Relaxants Failed - 07/07/2021 10:01 AM      Failed - This refill cannot be delegated      Passed - Valid encounter within last 6 months    Recent Outpatient Visits           3 months ago Type 2 diabetes mellitus with diabetic nephropathy, without long-term current use of insulin ([place])   [facility] Birdie Sons, MD   6 months ago Type 2 diabetes mellitus with diabetic nephropathy, without long-term current use of insulin ([place])   [facility] Birdie Sons, MD   8 months ago Chest wall pain   [facility] Birdie Sons, MD   9 months ago Type 2 diabetes mellitus with diabetic nephropathy, without long-term current use of insulin ([place])   [facility] Birdie Sons, MD   1 year ago Type 2 diabetes mellitus with diabetic nephropathy, without long-term current use of insulin [facility])   [facility] Birdie Sons, MD       Future Appointments             In 1 month [name], [name], MD [facility], [place]   In 1 month Ralene Bathe, MD [place]

## 2021-07-07 NOTE — Telephone Encounter (Signed)
Requested Prescriptions  Pending Prescriptions Disp Refills  . metFORMIN (GLUCOPHAGE-XR) 500 MG 24 hr tablet [Pharmacy Med Name: METFORMIN HCL ER 500 MG TAB] 60 tablet 0    Sig: TAKE 1 TABLET BY MOUTH TWICE A DAY     Endocrinology:  Diabetes - Biguanides Passed - 07/07/2021 11:33 AM      Passed - Cr in normal range and within 360 days    Creatinine  Date Value Ref Range Status  03/07/2015 0.84 mg/dL Final    Comment:    0.61-1.24 NOTE: New Reference Range  01/22/15    Creatinine, Ser  Date Value Ref Range Status  04/30/2021 1.12 0.61 - 1.24 mg/dL Final   Creatinine, POC  Date Value Ref Range Status  10/27/2016 n/a mg/dL Final         Passed - HBA1C is between 0 and 7.9 and within 180 days    Hemoglobin A1C  Date Value Ref Range Status  10/15/2018 6.8  Final   Hgb A1c MFr Bld  Date Value Ref Range Status  04/27/2021 7.4 (H) 4.8 - 5.6 % Final    Comment:    (NOTE)         Prediabetes: 5.7 - 6.4         Diabetes: >6.4         Glycemic control for adults with diabetes: <7.0    A1c  Date Value Ref Range Status  02/19/2016 6.5  Final         Passed - eGFR in normal range and within 360 days    EGFR (African American)  Date Value Ref Range Status  03/07/2015 >60  Final   GFR calc Af Amer  Date Value Ref Range Status  09/24/2020 97 >59 mL/min/1.73 Final    Comment:    **In accordance with recommendations from the NKF-ASN Task force,**   [facility] is in the process of updating its eGFR calculation to the   2021 CKD-EPI creatinine equation that estimates kidney function   without a race variable.    EGFR (Non-African Amer.)  Date Value Ref Range Status  03/07/2015 >60  Final    Comment:    eGFR values <75m/min/1.73 m2 may be an indication of chronic kidney disease (CKD). Calculated eGFR is useful in patients with stable renal function. The eGFR calculation will not be reliable in acutely ill patients when serum creatinine is changing rapidly. It is not useful  in patients on dialysis. The eGFR calculation may not be applicable to patients at the low and high extremes of body sizes, pregnant women, and vegetarians.    GFR, Estimated  Date Value Ref Range Status  04/30/2021 >60 >60 mL/min Final    Comment:    (NOTE) Calculated using the CKD-EPI Creatinine Equation (2021)          Passed - Valid encounter within last 6 months    Recent Outpatient Visits          3 months ago Type 2 diabetes mellitus with diabetic nephropathy, without long-term current use of insulin [facility]   6 months ago Type 2 diabetes mellitus with diabetic nephropathy, without long-term current use of insulin ([facility]   [place]   8 months ago Chest wall pain   [facility]   9 months ago Type 2 diabetes mellitus with diabetic nephropathy, without long-term current use of insulin ([place]   [facility] Birdie Sons, MD   1 year ago Type 2 diabetes mellitus with diabetic nephropathy, without long-term current use of insulin [facility])   [facility] Birdie Sons, MD      Future Appointments            In 1 month [name], [name], MD [facility], [place]   In 1 month Ralene Bathe, MD [place]

## 2021-07-07 NOTE — Telephone Encounter (Signed)
Please review for Dr. [name]. He has a f/u with him on 08/07/2021.

## 2021-07-14 ENCOUNTER — Telehealth: Payer: Self-pay | Admitting: Family Medicine

## 2021-07-14 NOTE — Telephone Encounter (Signed)
Requested medication (s) are due for refill today:   Yes  Requested medication (s) are on the active medication list:   Yes  Future visit scheduled:   Yes   Last ordered: 03/20/2021 #60, 2 refills  Returned because getting a very high warning from pharmacy that this causes palpitations and pt reports it puts him into A. Fib.   Requested Prescriptions  Pending Prescriptions Disp Refills   BREO ELLIPTA 100-25 MCG/INH AEPB [Pharmacy Med Name: BREO ELLIPTA 100-25 MCG/INH INH AEP] 60 each 2    Sig: INHALE 1 PUFF BY MOUTH INTO THE LUNGS DAILY.     Pulmonology:  Combination Products Passed - 07/14/2021  2:28 PM      Passed - Valid encounter within last 12 months    Recent Outpatient Visits           4 months ago Type 2 diabetes mellitus with diabetic nephropathy, without long-term current use of insulin ([place])   [facility] Birdie Sons, MD   7 months ago Type 2 diabetes mellitus with diabetic nephropathy, without long-term current use of insulin [facility])   [facility] Birdie Sons, MD   9 months ago Chest wall pain   [facility] Birdie Sons, MD   9 months ago Type 2 diabetes mellitus with diabetic nephropathy, without long-term current use of insulin [facility])   [facility] Birdie Sons, MD   1 year ago Type 2 diabetes mellitus with diabetic nephropathy, without long-term current use of insulin [facility])   [facility] Birdie Sons, MD       Future Appointments             In 4 weeks [name], [name], MD [facility], [place]   In 1 month Ralene Bathe, MD [place]

## 2021-07-14 NOTE — Telephone Encounter (Signed)
Please review [name] triage message and advise on refill request.

## 2021-07-16 DIAGNOSIS — I10 Essential (primary) hypertension: Secondary | ICD-10-CM

## 2021-07-16 DIAGNOSIS — I509 Heart failure, unspecified: Secondary | ICD-10-CM

## 2021-07-16 DIAGNOSIS — E1121 Type 2 diabetes mellitus with diabetic nephropathy: Secondary | ICD-10-CM

## 2021-07-16 DIAGNOSIS — I480 Paroxysmal atrial fibrillation: Secondary | ICD-10-CM | POA: Diagnosis not present

## 2021-07-18 NOTE — Telephone Encounter (Signed)
Please contact patient of Breo. He's been taking Breo for several months, it doesn't typically cause atrial fibrillation. Is he still taking it? Or does he need to change to a different inhaler?

## 2021-07-20 IMAGING — DX DG CHEST 1V PORT
1 series · 1 of 1 positions shown · non-contrast
Comparison: 08/26/2019

CLINICAL DATA: Chest pain

EXAM:
PORTABLE CHEST 1 VIEW

[chest ap]
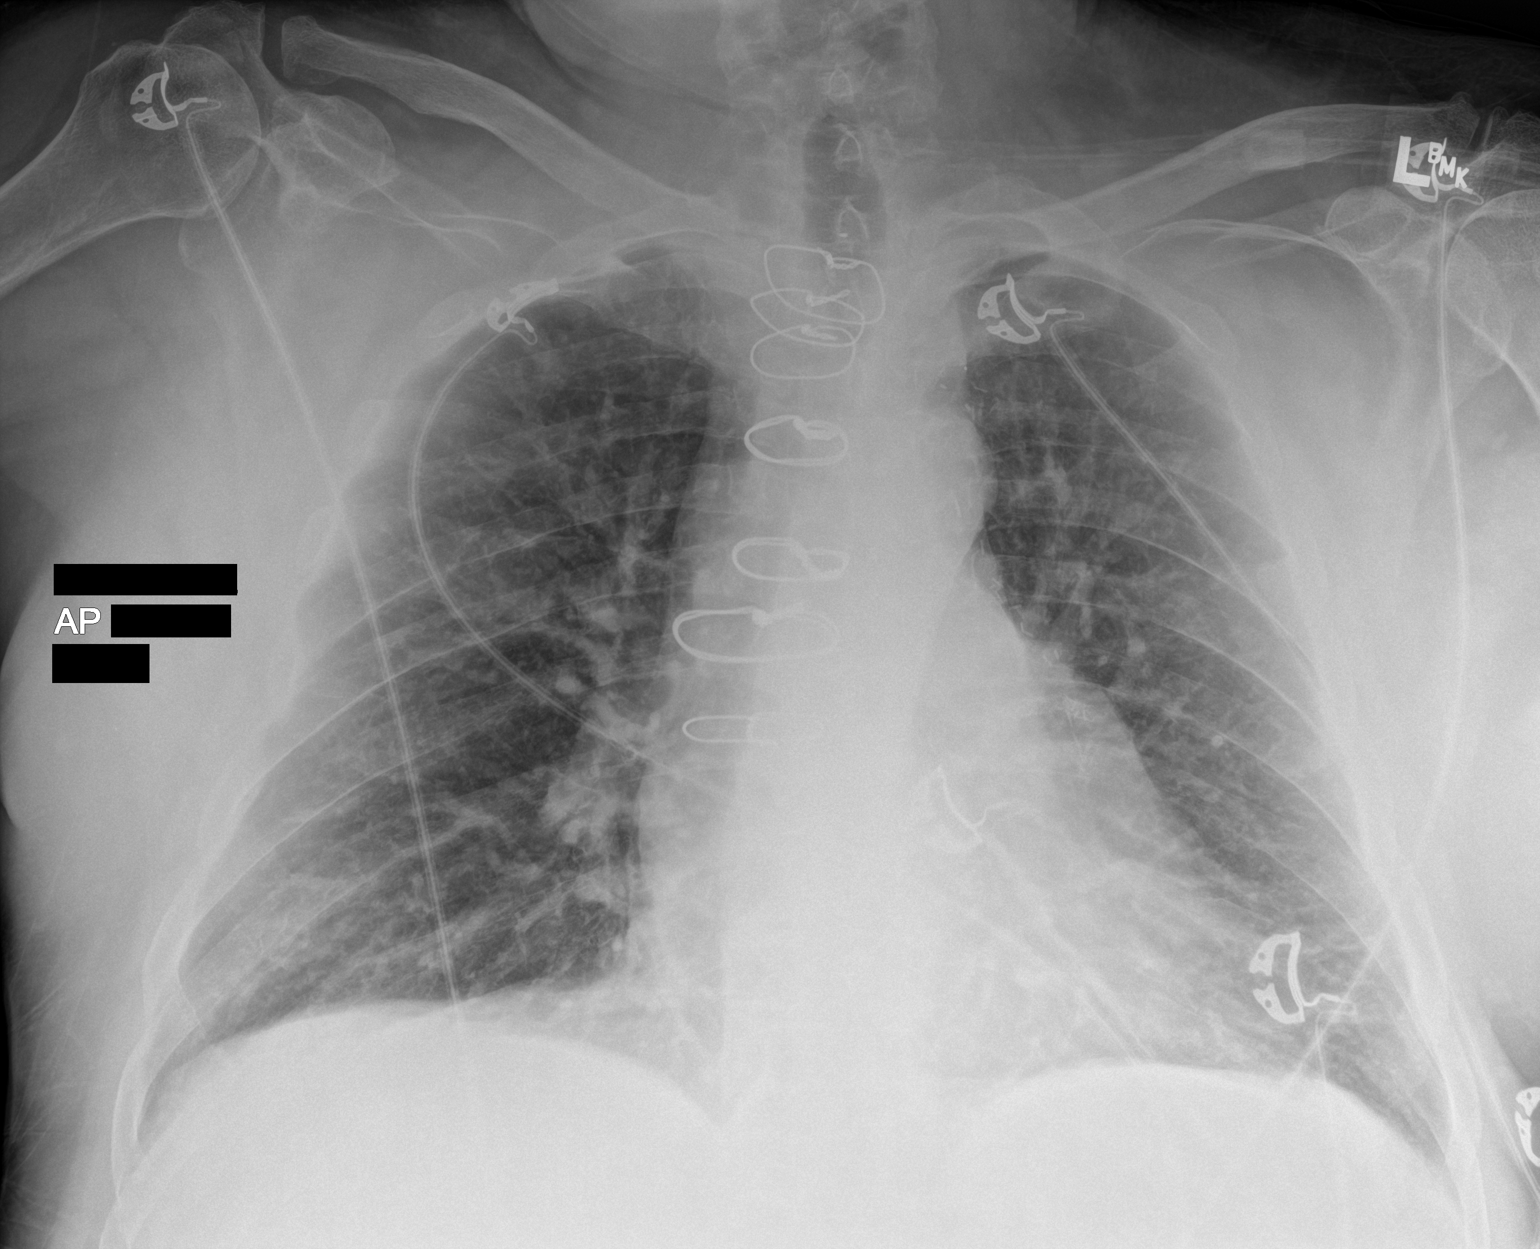

[1 of 1 positions shown; findings below may reference images not displayed]

FINDINGS: Cardiomegaly status post median sternotomy. Both lungs are clear.
The visualized skeletal structures are unremarkable.
IMPRESSION: Cardiomegaly without acute abnormality of the lungs in AP portable
projection.

## 2021-08-03 DIAGNOSIS — J449 Chronic obstructive pulmonary disease, unspecified: Secondary | ICD-10-CM | POA: Diagnosis not present

## 2021-08-04 ENCOUNTER — Other Ambulatory Visit: Payer: Self-pay | Admitting: Family Medicine

## 2021-08-04 DIAGNOSIS — F419 Anxiety disorder, unspecified: Secondary | ICD-10-CM

## 2021-08-04 DIAGNOSIS — M5410 Radiculopathy, site unspecified: Secondary | ICD-10-CM

## 2021-08-11 ENCOUNTER — Ambulatory Visit: Payer: Self-pay | Admitting: Family Medicine

## 2021-08-11 NOTE — Progress Notes (Deleted)
Established patient visit   Patient: Lawrence [name]   DOB: Jan 15, 1956   65 y.o. Male  MRN: [id number] Visit Date: 08/11/2021  Today's healthcare provider: [name], MD   No chief complaint on file.  Subjective    HPI  Diabetes Mellitus Type II, Follow-up  Lab Results  Component Value Date   HGBA1C 7.4 (H) 04/27/2021   HGBA1C 6.4 (A) 03/10/2021   HGBA1C 7.5 (H) 12/12/2020   Wt Readings from Last 3 Encounters:  05/01/21 214 lb 1.1 oz (97.1 kg)  03/10/21 220 lb (99.8 kg)  01/03/21 223 lb 8 oz (101.4 kg)   Last seen for diabetes 5 months ago.  Management since then includes none. He reports {excellent/good/fair/poor:19665} compliance with treatment. He {is/is not:21021397} having side effects. {document side effects if present:1} Symptoms: {Yes/No:20286} fatigue {Yes/No:20286} foot ulcerations  {Yes/No:20286} appetite changes {Yes/No:20286} nausea  {Yes/No:20286} paresthesia of the feet  {Yes/No:20286} polydipsia  {Yes/No:20286} polyuria {Yes/No:20286} visual disturbances   {Yes/No:20286} vomiting     Home blood sugar records: {diabetes glucometry results:16657}  Episodes of hypoglycemia? {Yes/No:20286} {enter symptoms and frequency of symptoms if yes:1}   Current insulin regiment: none Most Recent Eye Exam: 02/23/2018 {Current exercise:16438:::1} {Current diet habits:16563:::1}  Pertinent Labs: Lab Results  Component Value Date   CHOL 146 04/27/2021   HDL 32 (L) 04/27/2021   LDLCALC 35 04/27/2021   LDLDIRECT 122.1 (H) 09/27/2020   TRIG 397 (H) 04/27/2021   CHOLHDL 4.6 04/27/2021   Lab Results  Component Value Date   NA 137 04/30/2021   K 3.9 04/30/2021   CREATININE 1.12 04/30/2021   GFRNONAA >60 04/30/2021   GLUCOSE 176 (H) 04/30/2021     ---------------------------------------------------------------------------------------------------  Anxiety, Follow-up  He was last seen for anxiety 5 months ago. Changes made at last visit include  none.   He reports {excellent/good/fair/poor:19665} compliance with treatment. He reports {good/fair/poor:18685} tolerance of treatment. He {is/is not:21021397} having side effects. {document side effects if present:1}  He feels his anxiety is {Desc; severity:60313} and {improved/worse/unchanged:3041574} since last visit.  Symptoms: {Yes/No:20286} chest pain {Yes/No:20286} difficulty concentrating  {Yes/No:20286} dizziness {Yes/No:20286} fatigue  {Yes/No:20286} feelings of losing control {Yes/No:20286} insomnia  {Yes/No:20286} irritable {Yes/No:20286} palpitations  {Yes/No:20286} panic attacks {Yes/No:20286} racing thoughts  {Yes/No:20286} shortness of breath {Yes/No:20286} sweating  {Yes/No:20286} tremors/shakes    GAD-7 Results GAD-7 Generalized Anxiety Disorder Screening Tool 03/10/2021  1. Feeling Nervous, Anxious, or on Edge 0  2. Not Being Able to Stop or Control Worrying 0  3. Worrying Too Much About Different Things 0  4. Trouble Relaxing 0  5. Being So Restless it's Hard To Sit Still 0  6. Becoming Easily Annoyed or Irritable 1  7. Feeling Afraid As If Something Awful Might Happen 0  Total GAD-7 Score 1    PHQ-9 Scores PHQ9 SCORE ONLY 03/10/2021 01/15/2021 09/24/2020  PHQ-9 Total Score 3 0 0    ---------------------------------------------------------------------------------------------------  Hypertension, follow-up  BP Readings from Last 3 Encounters:  05/02/21 (!) 141/92  03/10/21 130/79  01/03/21 129/77   Wt Readings from Last 3 Encounters:  05/01/21 214 lb 1.1 oz (97.1 kg)  03/10/21 220 lb (99.8 kg)  01/03/21 223 lb 8 oz (101.4 kg)     He was last seen for hypertension 5 months ago.  BP at that visit was 130/79. Management since that visit includes none.  He reports {excellent/good/fair/poor:19665} compliance with treatment. He {is/is not:9024} having side effects. {document side effects if present:1} He is following a {diet:21022986} diet. He {is/is  not:9024} exercising. He {does/does not:200015} smoke.  Use of agents associated with hypertension: {bp agents assoc with hypertension:511::"none"}.   Outside blood pressures are {***enter patient reported home BP readings, or 'not being checked':1}. Symptoms: {Yes/No:20286} chest pain {Yes/No:20286} chest pressure  {Yes/No:20286} palpitations {Yes/No:20286} syncope  {Yes/No:20286} dyspnea {Yes/No:20286} orthopnea  {Yes/No:20286} paroxysmal nocturnal dyspnea {Yes/No:20286} lower extremity edema   Pertinent labs: Lab Results  Component Value Date   CHOL 146 04/27/2021   HDL 32 (L) 04/27/2021   LDLCALC 35 04/27/2021   LDLDIRECT 122.1 (H) 09/27/2020   TRIG 397 (H) 04/27/2021   CHOLHDL 4.6 04/27/2021   Lab Results  Component Value Date   NA 137 04/30/2021   K 3.9 04/30/2021   CREATININE 1.12 04/30/2021   GFRNONAA >60 04/30/2021   GLUCOSE 176 (H) 04/30/2021     The ASCVD Risk score (Arnett DK, et al., 2019) failed to calculate for the following reasons:   The patient has a prior MI or stroke diagnosis   ---------------------------------------------------------------------------------------------------   {Link to patient history deactivated due to formatting error:1}  Medications: Outpatient Medications Prior to Visit  Medication Sig   Accu-Chek Softclix Lancets lancets Use as instructed to check sugar daily for type 2 diabetes.   allopurinol (ZYLOPRIM) 300 MG tablet TAKE 1 TABLET BY MOUTH TWICE A DAY   ALPRAZolam (XANAX) 1 MG tablet TAKE 1 TABLET BY MOUTH 3 TIMES DAILY   amLODipine (NORVASC) 2.5 MG tablet Take 1 tablet (2.5 mg total) by mouth daily.   aspirin 81 MG chewable tablet Chew 81 mg by mouth daily. Swallows whole   atorvastatin (LIPITOR) 80 MG tablet TAKE ONE TABLET BY MOUTH AT BEDTIME   Blood Glucose Calibration (ACCU-CHEK GUIDE CONTROL) LIQD Use with blood glucose monitor as directed   Blood Glucose Monitoring Suppl (ACCU-CHEK GUIDE) w/Device KIT Use to check  blood sugars as directed   cetirizine (ZYRTEC) 10 MG tablet TAKE ONE TABLET BY MOUTH AT BEDTIME   docusate sodium (COLACE) 100 MG capsule Take 100 mg by mouth daily as needed for mild constipation.   ELIQUIS 5 MG TABS tablet TAKE 1 TABLET BY MOUTH TWICE A DAY   FARXIGA 10 MG TABS tablet TAKE 1 TABLET BY MOUTH DAILY   fluticasone furoate-vilanterol (BREO ELLIPTA) 100-25 MCG/INH AEPB INHALE 1 PUFF BY MOUTH INTO THE LUNGS DAILY.   glucose blood (ACCU-CHEK GUIDE) test strip Use as instructed to check sugar daily for type 2 diabetes.   isosorbide mononitrate (IMDUR) 120 MG 24 hr tablet TAKE 1 TABLET BY MOUTH DAILY   linaclotide (LINZESS) 72 MCG capsule Take 72 mcg by mouth daily before breakfast.    lisinopril (ZESTRIL) 10 MG tablet TAKE 1 TABLET BY MOUTH DAILY   metFORMIN (GLUCOPHAGE-XR) 500 MG 24 hr tablet TAKE 1 TABLET BY MOUTH TWICE A DAY   methocarbamol (ROBAXIN) 500 MG tablet TAKE 1 TABLET BY MOUTH EVERY 8 HOURS AS NEEDED FOR MUSCLE SPASMS   naloxone (NARCAN) nasal spray 4 mg/0.1 mL 1 spray into nostril x1 and may repeat every 2-3 minutes until patient is responsive or EMS arrives   nitroGLYCERIN (NITROSTAT) 0.4 MG SL tablet PLACE ONE TABLET UNDER THE TONGUE EVERY 5 MINUTES FOR CHEST PAIN. IF NO RELIEF PAST 3RD TAB GO TO EMERGENCY ROOM   omega-3 acid ethyl esters (LOVAZA) 1 g capsule TAKE 4 CAPSULES BY MOUTH DAILY   omeprazole (PRILOSEC) 40 MG capsule Take 40 mg by mouth 2 (two) times daily.   oxyCODONE-acetaminophen (PERCOCET) 10-325 MG tablet TAKE 1 TABLET BY MOUTH EVERY  4 HOURS AS NEEDED FOR PAIN   ranolazine (RANEXA) 1000 MG SR tablet Take 1 tablet (1,000 mg total) by mouth 2 (two) times daily.   sotalol (BETAPACE) 80 MG tablet Take 40 mg by mouth 2 (two) times daily.   tiotropium (SPIRIVA HANDIHALER) 18 MCG inhalation capsule Place 1 capsule (18 mcg total) into inhaler and inhale daily.   torsemide (DEMADEX) 100 MG tablet Take 0.5 tablets (50 mg total) by mouth daily.   traZODone (DESYREL)  150 MG tablet TAKE 1 TABLET BY MOUTH AT BEDTIME   venlafaxine XR (EFFEXOR-XR) 75 MG 24 hr capsule TAKE 1 CAPSULE BY MOUTH DAILY WITH BREAKFAST   No facility-administered medications prior to visit.    Review of Systems  {Labs  Heme  Chem  Endocrine  Serology  Results Review (optional):23779}   Objective    There were no vitals taken for this visit. {Show previous vital signs (optional):23777}  Physical Exam  ***  No results found for any visits on 08/11/21.  Assessment & Plan     ***  No follow-ups on file.      {provider attestation***:1}   [name], MD  [facility] [phone] (phone) [phone] (fax)  [place]

## 2021-08-13 ENCOUNTER — Other Ambulatory Visit: Payer: Self-pay | Admitting: Family Medicine

## 2021-08-14 ENCOUNTER — Ambulatory Visit: Payer: Medicare HMO | Admitting: Dermatology

## 2021-08-15 ENCOUNTER — Ambulatory Visit (INDEPENDENT_AMBULATORY_CARE_PROVIDER_SITE_OTHER): Payer: Medicare HMO

## 2021-08-15 DIAGNOSIS — I509 Heart failure, unspecified: Secondary | ICD-10-CM

## 2021-08-15 DIAGNOSIS — J449 Chronic obstructive pulmonary disease, unspecified: Secondary | ICD-10-CM

## 2021-08-15 DIAGNOSIS — E1121 Type 2 diabetes mellitus with diabetic nephropathy: Secondary | ICD-10-CM | POA: Diagnosis not present

## 2021-08-15 IMAGING — DX DG CHEST 1V PORT
1 series · 1 of 1 positions shown · non-contrast
Comparison: 09/08/2019

CLINICAL DATA: Left side chest pain, fever

EXAM:
PORTABLE CHEST 1 VIEW

[chest ap]
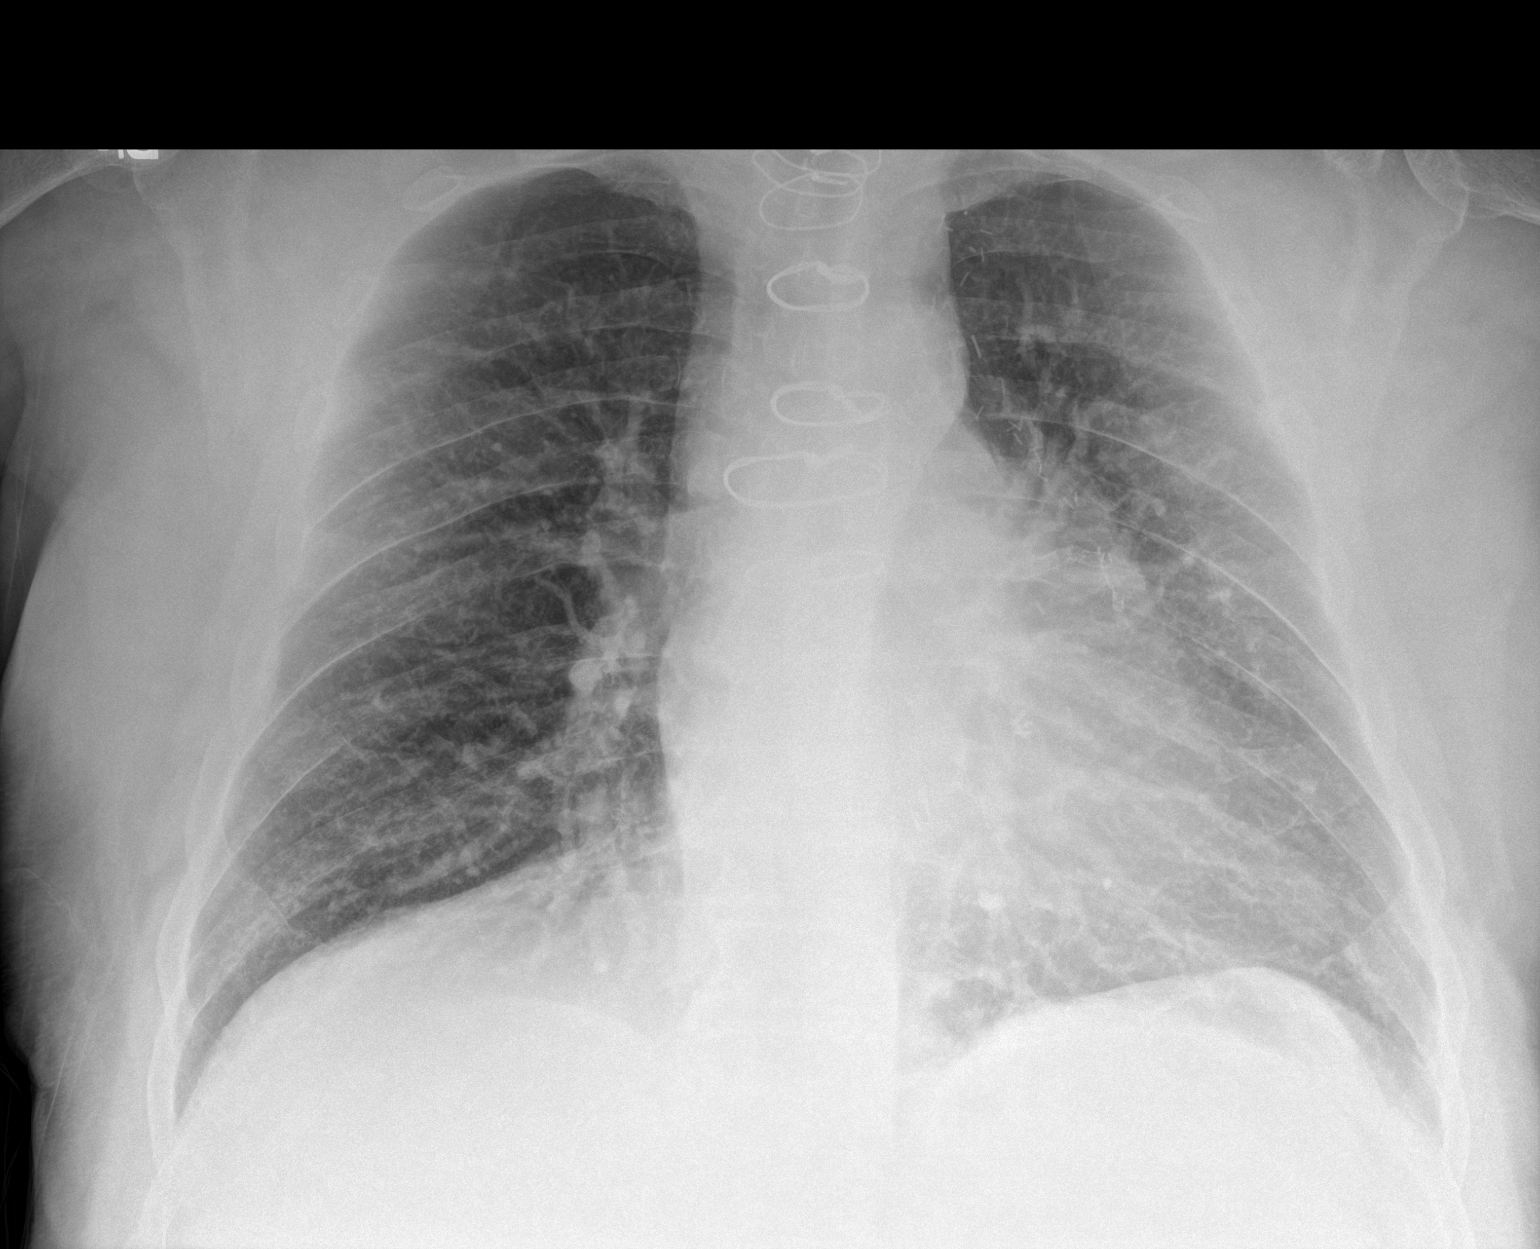

[1 of 1 positions shown; findings below may reference images not displayed]

FINDINGS: Prior CABG. Heart is normal size. Right lung clear. Vague densities
throughout the lingula, favor scarring. No visible effusions or
acute bony abnormality.
IMPRESSION: Vague opacity throughout the lingula, favor scarring.

No definite acute cardiopulmonary disease.

## 2021-08-15 NOTE — Patient Instructions (Addendum)
Thank you for allowing the Chronic Care Management team to participate in your care.    Patient Care Plan: COPD (Adult)     Problem Identified: Symptom Exacerbation (COPD)      Long-Range Goal: Symptom Exacerbation Prevented or Minimized   Start Date: 01/15/2021  Expected End Date: 05/15/2021  Priority: High  Note:   Current Barriers:  Chronic Disease Management support and educational needs r/t COPD  Case Manager Clinical Goal(s): Over the next 120 days, patient will not require hospitalization for complications r/t COPD exacerbation  Interventions:  Collaboration with Birdie Sons, MD regarding development and update of comprehensive plan of care as evidenced by provider attestation and co-signature Inter-disciplinary care team collaboration (see longitudinal plan of care) Reviewed medications and current treatment plan. Reports doing very well with COPD self-management. Symptoms remain well controlled with prescribed inhalers. Denies cough. Continues to experience exertional dyspnea but denies symptoms at rest. Report doing very well in the home. Able to complete ADL's and tasks in the home without difficultly. Reports doing very well with COPD self- management Encouraged to continue daily symptom assessments. Reviewed worsening s/sx that require immediate medical attention.    Self-Care Activities/Patient Goals: -Take medications and utilize inhalers as prescribed -Assess symptoms daily and notify provider if in the yellow zone for 48 hrs without improvement -Avoid extreme temperatures and strenuous activites -Follow recommendations to prevent respiratory infection -Notify provider or care management team with questions and new concerns as needed   Goal Met    Patient Care Plan: Diabetes Type 2 (Adult)     Problem Identified: Glycemic Management (Diabetes, Type 2)      Long-Range Goal: Glycemic Management Optimized   Start Date: 06/13/2021  Expected End Date:  09/10/2021  Priority: High  Note:    Current Barriers:  Chronic disease management and support related to Diabetes self-management   Case Manager Clinical Goal(s):  Over the next 120 days, patient will demonstrate improved adherence to prescribed treatment plan for Diabetes self-management as evidenced by daily monitoring and recording of CBG, adherence to ADA/ carb modified diet and adherence to prescribed medication regimen.   Interventions:  Collaboration with Birdie Sons, MD regarding development and update of comprehensive plan of care as evidenced by provider attestation and co-signature Inter-disciplinary care team collaboration (see longitudinal plan of care) Reviewed medications and current treatment plan. Reports excellent compliance with medications. Reviewed s/sx of hypoglycemia and hyperglycemia along with appropriate interventions. Reports not monitoring his blood glucose consistently d/t his device. Denies experiencing symptoms of hypoglycemia or hyperglycemia. Advised to notify his insurance provider regarding his glucometer and notify the clinic if a prescription for a new device is needed.    Patient Goals/Self-Care Activities Self-administer medications as prescribed Attend all scheduled provider appointments Monitor blood glucose levels consistently and utilize recommended interventions Adhere to prescribed ADA/carb modified Contact insurance provider regarding medical device/glucometer not functioning properly Update the clinic if a prescription for a new device is needed Notify provider or care management team with questions and new concerns as needed   Follow Up Plan:  Will follow up next month      Patient Care Plan: Heart Failure, A-Fib, HTN     Problem Identified: CHF, A-Fib and HTN      Long-Range Goal: Symptom Exacerbation Prevented or Minimized   Start Date: 06/13/2021  Expected End Date: 09/11/2021  Priority: High  Note:   Current  Barriers:  Chronic Disease Management support and educational needs r/t CHF.  Case Manager Clinical  Goal(s):  Over the next 120 days, patient will not require hospitalization or emergent care d/t complications r/t CHF exacerbation.  Interventions:  Collaboration with Birdie Sons, MD regarding development and update of comprehensive plan of care as evidenced by provider attestation and co-signature Inter-disciplinary care team collaboration (see longitudinal plan of care) Reviewed treatment plan and recommendations provided by the Cardiology team. Reports excellent compliance with medications. Denies episodes of chest pain. Reports experiencing palpitations intermittently. Reports symptoms resolve quickly with rest. Denies episodes of shortness of breath. Continues using supplemental oxygen at 2L/min at night. Denies recent decline in activity tolerance. Reviewed weight parameters and indications for notifying a provider. Reports weights have been within range. Reports current weight of 216 lbs.  Reviewed worsening s/sx and indications for seeking immediate medical attention.   Patient Goals/Self-Care Activities:  Take all medications as prescribed Attend scheduled medical appointments Monitor weight and record readings Adhere to recommended cardiac prudent/heart healthy diet Notify provider for weight gain outside of established parameters Contact provider or care management team with questions and new concerns as needed.   Follow Up Plan:  Will follow up next month            Mr. Lobato verbalized understanding of the information discussed during the telephonic outreach. Declined need for mailed/printed instructions. A member of the care management team will follow up next month.    [facility] [phone]

## 2021-08-15 NOTE — Chronic Care Management (AMB) (Signed)
Chronic Care Management   CCM RN Visit Note  08/15/2021 Name: Lawrence [name] MRN: [id number] DOB: [birth year]  Subjective: Lawrence [name] is a 65 y.o. year old male who is a primary care patient of [name], [name], MD. The care management team was consulted for assistance with disease management and care coordination needs.    Engaged with patient by telephone for follow up visit in response to provider referral for case management and care coordination services.   Consent to Services:  The patient was given information about Chronic Care Management services, agreed to services, and gave verbal consent prior to initiation of services.  Please see initial visit note for detailed documentation.   Assessment: Review of patient past medical history, allergies, medications, health status, including review of consultants reports, laboratory and other test data, was performed as part of comprehensive evaluation and provision of chronic care management services.   SDOH (Social Determinants of Health) assessments and interventions performed: No  CCM Care Plan  Allergies  Allergen Reactions   Demerol  [Meperidine Hcl]    Demerol [Meperidine] Hives   Jardiance [Empagliflozin] Other (See Comments)    Perineal pain   Prednisone Other (See Comments) and Hypertension    Pt states that this medication puts him in A-fib    Sulfa Antibiotics Hives   Albuterol Sulfate [Albuterol] Palpitations and Other (See Comments)    Pt currently uses this medication.     Morphine Sulfate Nausea And Vomiting, Rash and Other (See Comments)    Pt states that he is only allergic to the tablet form of this medication.      Outpatient Encounter Medications as of 08/15/2021  Medication Sig Note   Accu-Chek Softclix Lancets lancets Use as instructed to check sugar daily for type 2 diabetes.    allopurinol (ZYLOPRIM) 300 MG tablet TAKE 1 TABLET BY MOUTH TWICE A DAY    ALPRAZolam (XANAX) 1 MG tablet TAKE 1 TABLET BY  MOUTH 3 TIMES DAILY    amLODipine (NORVASC) 2.5 MG tablet Take 1 tablet (2.5 mg total) by mouth daily.    aspirin 81 MG chewable tablet Chew 81 mg by mouth daily. Swallows whole    atorvastatin (LIPITOR) 80 MG tablet TAKE ONE TABLET BY MOUTH AT BEDTIME    Blood Glucose Calibration (ACCU-CHEK GUIDE CONTROL) LIQD Use with blood glucose monitor as directed    Blood Glucose Monitoring Suppl (ACCU-CHEK GUIDE) w/Device KIT Use to check blood sugars as directed    cetirizine (ZYRTEC) 10 MG tablet TAKE ONE TABLET BY MOUTH AT BEDTIME    docusate sodium (COLACE) 100 MG capsule Take 100 mg by mouth daily as needed for mild constipation.    ELIQUIS 5 MG TABS tablet TAKE 1 TABLET BY MOUTH TWICE A DAY    FARXIGA 10 MG TABS tablet TAKE 1 TABLET BY MOUTH DAILY    fluticasone furoate-vilanterol (BREO ELLIPTA) 100-25 MCG/INH AEPB INHALE 1 PUFF BY MOUTH INTO THE LUNGS DAILY.    glucose blood (ACCU-CHEK GUIDE) test strip Use as instructed to check sugar daily for type 2 diabetes.    isosorbide mononitrate (IMDUR) 120 MG 24 hr tablet TAKE 1 TABLET BY MOUTH DAILY    linaclotide (LINZESS) 72 MCG capsule Take 72 mcg by mouth daily before breakfast.     lisinopril (ZESTRIL) 10 MG tablet TAKE 1 TABLET BY MOUTH DAILY    metFORMIN (GLUCOPHAGE-XR) 500 MG 24 hr tablet TAKE 1 TABLET BY MOUTH TWICE A DAY    methocarbamol (ROBAXIN) 500 MG  tablet TAKE 1 TABLET BY MOUTH EVERY 8 HOURS AS NEEDED FOR MUSCLE SPASMS    naloxone (NARCAN) nasal spray 4 mg/0.1 mL 1 spray into nostril x1 and may repeat every 2-3 minutes until patient is responsive or EMS arrives    nitroGLYCERIN (NITROSTAT) 0.4 MG SL tablet PLACE ONE TABLET UNDER THE TONGUE EVERY 5 MINUTES FOR CHEST PAIN. IF NO RELIEF PAST 3RD TAB GO TO EMERGENCY ROOM    omega-3 acid ethyl esters (LOVAZA) 1 g capsule TAKE 4 CAPSULES BY MOUTH DAILY    omeprazole (PRILOSEC) 40 MG capsule Take 40 mg by mouth 2 (two) times daily.    oxyCODONE-acetaminophen (PERCOCET) 10-325 MG tablet TAKE 1  TABLET BY MOUTH EVERY 4 HOURS AS NEEDED FOR PAIN    ranolazine (RANEXA) 1000 MG SR tablet Take 1 tablet (1,000 mg total) by mouth 2 (two) times daily.    sotalol (BETAPACE) 80 MG tablet Take 40 mg by mouth 2 (two) times daily.    tiotropium (SPIRIVA HANDIHALER) 18 MCG inhalation capsule Place 1 capsule (18 mcg total) into inhaler and inhale daily.    torsemide (DEMADEX) 100 MG tablet Take 0.5 tablets (50 mg total) by mouth daily. 01/15/2021: Reports taking as needed.   traZODone (DESYREL) 150 MG tablet TAKE 1 TABLET BY MOUTH AT BEDTIME    venlafaxine XR (EFFEXOR-XR) 75 MG 24 hr capsule TAKE 1 CAPSULE BY MOUTH DAILY WITH BREAKFAST    [DISCONTINUED] albuterol (VENTOLIN HFA) 108 (90 Base) MCG/ACT inhaler INHALE 2 PUFFS BY MOUTH EVERY 6 HOURS AS NEEDED FOR SHORTNESS OF BREATH (Patient not taking: No sig reported)    No facility-administered encounter medications on file as of 08/15/2021.    Patient Active Problem List   Diagnosis Date Noted   Chronic diastolic heart failure ([place])    Hypertensive urgency 04/27/2021   Atherosclerosis of aorta ([place]) 12/16/2020   Syncope 12/11/2020   Acidosis 12/11/2020   Polycythemia 10/05/2020   NSTEMI (non-ST elevated myocardial infarction) ([place]) 09/26/2020   Primary insomnia 05/13/2020   Recurrent major depressive disorder, remission status unspecified ([place]) 03/29/2020   Elevated troponin 03/07/2020   Bereavement 09/09/2019   Chronic respiratory failure with hypoxia ([place]) 05/29/2019   Morbid obesity ([place]) 05/29/2019   Trigger thumb of right hand 11/28/2018   Eye pain, left 08/18/2018   Acute on chronic heart failure ([place]) 04/18/2018   History of adenomatous polyp of colon 04/05/2018   Abdominal pain, chronic, epigastric 11/06/2017   Bilateral flank pain 03/24/2017   Dyspnea 04/03/2016   Hypotension 04/03/2016   CKD (chronic kidney disease) stage 2, GFR 60-89 ml/min 04/03/2016   Anemia 04/03/2016   Atrial fibrillation ([place]) 12/23/2015   OSA (obstructive  sleep apnea) 12/10/2015   Left inguinal hernia 11/07/2015   Anxiety 11/07/2015   Unstable angina (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[place]) 06/26/2015   Achalasia 07/24/2014   GERD (gastroesophageal reflux disease) 06/07/2014   Former tobacco use 04/11/2013   HLD (hyperlipidemia) 04/09/2013    Conditions to be addressed/monitored:CHF, COPD, and DMII Patient Care Plan: COPD (Adult)     Problem Identified: Symptom Exacerbation (COPD)  Long-Range Goal: Symptom Exacerbation Prevented or Minimized   Start Date: 01/15/2021  Expected End Date: 05/15/2021  Priority: High  Note:   Current Barriers:  Chronic Disease Management support and educational needs r/t COPD  Case Manager Clinical Goal(s): Over the next 120 days, patient will not require hospitalization for complications r/t COPD exacerbation  Interventions:  Collaboration with Birdie Sons, MD regarding development and update of comprehensive plan of care as evidenced by provider attestation and co-signature Inter-disciplinary care team collaboration (see longitudinal plan of care) Reviewed medications and current treatment plan. Reports doing very well with COPD self-management. Symptoms remain well controlled with prescribed inhalers. Denies cough. Continues to experience exertional dyspnea but denies symptoms at rest. Report doing very well in the home. Able to complete ADL's and tasks in the home without difficultly. Reports doing very well with COPD self- management Encouraged to continue daily symptom  assessments. Reviewed worsening s/sx that require immediate medical attention.    Self-Care Activities/Patient Goals: -Take medications and utilize inhalers as prescribed -Assess symptoms daily and notify provider if in the yellow zone for 48 hrs without improvement -Avoid extreme temperatures and strenuous activites -Follow recommendations to prevent respiratory infection -Notify provider or care management team with questions and new concerns as needed   Goal Met    Patient Care Plan: Diabetes Type 2 (Adult)     Problem Identified: Glycemic Management (Diabetes, Type 2)      Long-Range Goal: Glycemic Management Optimized   Start Date: 06/13/2021  Expected End Date: 09/10/2021  Priority: High  Note:    Current Barriers:  Chronic disease management and support related to Diabetes self-management   Case Manager Clinical Goal(s):  Over the next 120 days, patient will demonstrate improved adherence to prescribed treatment plan for Diabetes self-management as evidenced by daily monitoring and recording of CBG, adherence to ADA/ carb modified diet and adherence to prescribed medication regimen.   Interventions:  Collaboration with Birdie Sons, MD regarding development and update of comprehensive plan of care as evidenced by provider attestation and co-signature Inter-disciplinary care team collaboration (see longitudinal plan of care) Reviewed medications and current treatment plan. Reports excellent compliance with medications. Reviewed s/sx of hypoglycemia and hyperglycemia along with appropriate interventions. Reports not monitoring his blood glucose consistently d/t his device. Denies experiencing symptoms of hypoglycemia or hyperglycemia. Advised to notify his insurance provider regarding his glucometer and notify the clinic if a prescription for a new device is needed.    Patient Goals/Self-Care Activities Self-administer medications as prescribed Attend all  scheduled provider appointments Monitor blood glucose levels consistently and utilize recommended interventions Adhere to prescribed ADA/carb modified Contact insurance provider regarding medical device/glucometer not functioning properly Update the clinic if a prescription for a new device is needed Notify provider or care management team with questions and new concerns as needed   Follow Up Plan:  Will follow up next month      Patient Care Plan: Heart Failure, A-Fib, HTN     Problem Identified: CHF, A-Fib and HTN      Long-Range Goal: Symptom Exacerbation Prevented or Minimized   Start Date: 06/13/2021  Expected End Date: 09/11/2021  Priority: High  Note:   Current Barriers:  Chronic Disease Management support and educational needs r/t CHF.  Case Manager Clinical Goal(s):  Over the next 120 days, patient will not require hospitalization or emergent care d/t complications r/t CHF exacerbation.  Interventions:  Collaboration with Birdie Sons, MD regarding development and update of comprehensive plan  of care as evidenced by provider attestation and co-signature Inter-disciplinary care team collaboration (see longitudinal plan of care) Reviewed treatment plan and recommendations provided by the Cardiology team. Reports excellent compliance with medications. Denies episodes of chest pain. Reports experiencing palpitations intermittently. Reports symptoms resolve quickly with rest. Denies episodes of shortness of breath. Continues using supplemental oxygen at 2L/min at night. Denies recent decline in activity tolerance. Reviewed weight parameters and indications for notifying a provider. Reports weights have been within range. Reports current weight of 216 lbs.  Reviewed worsening s/sx and indications for seeking immediate medical attention.   Patient Goals/Self-Care Activities:  Take all medications as prescribed Attend scheduled medical appointments Monitor weight and  record readings Adhere to recommended cardiac prudent/heart healthy diet Notify provider for weight gain outside of established parameters Contact provider or care management team with questions and new concerns as needed.   Follow Up Plan:  Will follow up next month           PLAN A member of the care management team will follow up next month.    Cristy Friedlander [facility] Care Management [facility] [phone]

## 2021-08-20 ENCOUNTER — Other Ambulatory Visit: Payer: Self-pay | Admitting: Family Medicine

## 2021-08-20 NOTE — Patient Instructions (Signed)
Thank you for allowing the Chronic Care Management team to participate in your care.   Patient Care Plan: Diabetes Type 2 (Adult)     Problem Identified: Glycemic Management (Diabetes, Type 2)      Long-Range Goal: Glycemic Management Optimized   Start Date: 06/13/2021  Expected End Date: 09/10/2021  Priority: High  Note:    Current Barriers:  Chronic disease management and support related to Diabetes self-management   Case Manager Clinical Goal(s):  Over the next 120 days, patient will demonstrate improved adherence to prescribed treatment plan for Diabetes self-management as evidenced by daily monitoring and recording of CBG, adherence to ADA/ carb modified diet and adherence to prescribed medication regimen.   Interventions:  Collaboration with Birdie Sons, MD regarding development and update of comprehensive plan of care as evidenced by provider attestation and co-signature Inter-disciplinary care team collaboration (see longitudinal plan of care) Reviewed medications and current treatment plan. Reports excellent compliance with medications. Reviewed s/sx of hypoglycemia and hyperglycemia along with appropriate interventions. Reports not monitoring his blood glucose consistently d/t his device. Denies experiencing symptoms of hypoglycemia or hyperglycemia. Advised to notify his insurance provider regarding his glucometer and notify the clinic if a prescription for a new device is needed.    Patient Goals/Self-Care Activities Self-administer medications as prescribed Attend all scheduled provider appointments Monitor blood glucose levels consistently and utilize recommended interventions Adhere to prescribed ADA/carb modified Contact insurance provider regarding medical device/glucometer not functioning properly Update the clinic if a prescription for a new device is needed Notify provider or care management team with questions and new concerns as needed   Follow Up  Plan:  Will follow up next month      Patient Care Plan: Heart Failure, A-Fib, HTN     Problem Identified: CHF, A-Fib and HTN      Long-Range Goal: Symptom Exacerbation Prevented or Minimized   Start Date: 06/13/2021  Expected End Date: 09/11/2021  Priority: High  Note:   Current Barriers:  Chronic Disease Management support and educational needs r/t CHF.  Case Manager Clinical Goal(s):  Over the next 120 days, patient will not require hospitalization or emergent care d/t complications r/t CHF exacerbation.  Interventions:  Collaboration with Birdie Sons, MD regarding development and update of comprehensive plan of care as evidenced by provider attestation and co-signature Inter-disciplinary care team collaboration (see longitudinal plan of care) Reviewed treatment plan and recommendations provided by the Cardiology team. Continues to take medications as prescribed.  Discussed recent evaluation in the Emergency Department. Evaluation indicated symptoms were related to A-Fib. Reports feeling well today. Denies episodes of chest pain, palpitations or shortness of breath since being evaluated. Reports activity tolerance has returned to baseline.  Reviewed weight parameters and indications for notifying a provider Reviewed worsening s/sx and indications for seeking immediate medical attention.   Patient Goals/Self-Care Activities:  Take all medications as prescribed Attend scheduled medical appointments Monitor weight and record readings Adhere to recommended cardiac prudent/heart healthy diet Notify provider for weight gain outside of established parameters Contact provider or care management team with questions and new concerns as needed.   Follow Up Plan:  Will follow up next month           Mr. Mcgann verbalized understanding of the information discussed during the telephonic outreach. Declined need for mailed/printed instructions. A member of the care  management team will follow up next month.    Cristy [facility]lander [facility] Care Management [name] [phone]

## 2021-08-27 ENCOUNTER — Other Ambulatory Visit: Payer: Self-pay | Admitting: Family Medicine

## 2021-08-28 ENCOUNTER — Other Ambulatory Visit: Payer: Self-pay | Admitting: Family Medicine

## 2021-09-01 ENCOUNTER — Other Ambulatory Visit: Payer: Self-pay | Admitting: Family Medicine

## 2021-09-01 DIAGNOSIS — M5410 Radiculopathy, site unspecified: Secondary | ICD-10-CM

## 2021-09-01 DIAGNOSIS — G4700 Insomnia, unspecified: Secondary | ICD-10-CM

## 2021-09-02 DIAGNOSIS — J449 Chronic obstructive pulmonary disease, unspecified: Secondary | ICD-10-CM | POA: Diagnosis not present

## 2021-09-16 ENCOUNTER — Telehealth: Payer: Self-pay

## 2021-09-16 NOTE — Telephone Encounter (Signed)
  Care Management   Follow Up Note   09/16/2021 Name: Lawrence [name] MRN: [id number] DOB: 27-Dec-1955   Primary Care Provider: Birdie Sons, MD Reason for referral : Chronic Care Management   An unsuccessful telephone outreach was attempted today. The patient was referred to the case management team for assistance with care management and care coordination.    Follow Up Plan:  A HIPAA compliant voice message was left today requesting a return call.   Cristy Friedlander [facility] Care Management [facility] [phone]

## 2021-09-17 ENCOUNTER — Other Ambulatory Visit: Payer: Self-pay | Admitting: Family Medicine

## 2021-09-17 DIAGNOSIS — J449 Chronic obstructive pulmonary disease, unspecified: Secondary | ICD-10-CM

## 2021-09-17 DIAGNOSIS — I10 Essential (primary) hypertension: Secondary | ICD-10-CM

## 2021-09-30 ENCOUNTER — Other Ambulatory Visit: Payer: Self-pay | Admitting: Family Medicine

## 2021-09-30 DIAGNOSIS — M5410 Radiculopathy, site unspecified: Secondary | ICD-10-CM

## 2021-09-30 DIAGNOSIS — F419 Anxiety disorder, unspecified: Secondary | ICD-10-CM

## 2021-09-30 MED ORDER — ALPRAZOLAM 1 MG PO TABS: 1.0000 mg | ORAL_TABLET | Freq: Three times a day (TID) | ORAL | 3 refills | Status: DC

## 2021-09-30 NOTE — Telephone Encounter (Signed)
LOV 03/10/2021   (Pt no showed apts 05/21/2021, 06/09/2021, and 08/11/2021)   Thanks,   -Mickel Baas

## 2021-09-30 NOTE — Telephone Encounter (Signed)
Requested medication (s) are due for refill today: no  Requested medication (s) are on the active medication list: no  Last refill: 08/04/21 #90  3 refills  Future visit scheduled no  Notes to clinic: not delegated  Requested Prescriptions  Pending Prescriptions Disp Refills   ALPRAZolam (XANAX) 1 MG tablet 90 tablet 3    Sig: Take 1 tablet (1 mg total) by mouth 3 (three) times daily.     Not Delegated - Psychiatry:  Anxiolytics/Hypnotics Failed - 09/30/2021  9:56 AM      Failed - This refill cannot be delegated      Failed - Valid encounter within last 6 months    Recent Outpatient Visits           6 months ago Type 2 diabetes mellitus with diabetic nephropathy, without long-term current use of insulin [facility]   [facility] Birdie Sons, MD   9 months ago Type 2 diabetes mellitus with diabetic nephropathy, without long-term current use of insulin [facility])   [facility] Birdie Sons, MD   11 months ago Chest wall pain   [facility] Birdie Sons, MD   1 year ago Type 2 diabetes mellitus with diabetic nephropathy, without long-term current use of insulin [facility])   [facility] Birdie Sons, MD   1 year ago Type 2 diabetes mellitus with diabetic nephropathy, without long-term current use of insulin [facility])   [place], MD              Passed - Urine Drug Screen completed in last 360 days

## 2021-09-30 NOTE — Telephone Encounter (Signed)
LOV:  03/10/2021

## 2021-09-30 NOTE — Telephone Encounter (Signed)
Requested medication (s) are due for refill today -yes  Requested medication (s) are on the active medication list -yes  Future visit scheduled -no  Last refill: 09/01/21 #180  Notes to clinic: Request RF: non delegated Rx  Requested Prescriptions  Pending Prescriptions Disp Refills   oxyCODONE-acetaminophen (PERCOCET) 10-325 MG tablet [Pharmacy Med Name: OXYCODONE-APAP 10-325 MG TAB] 180 tablet     Sig: TAKE 1 TABLET BY MOUTH EVERY 4 HOURS AS NEEDED FOR PAIN     Not Delegated - Analgesics:  Opioid Agonist Combinations Failed - 09/30/2021  9:04 AM      Failed - This refill cannot be delegated      Failed - Valid encounter within last 6 months    Recent Outpatient Visits           6 months ago Type 2 diabetes mellitus with diabetic nephropathy, without long-term current use of insulin ([place])   [facility] Birdie Sons, MD   9 months ago Type 2 diabetes mellitus with diabetic nephropathy, without long-term current use of insulin ([place])   [facility] Birdie Sons, MD   11 months ago Chest wall pain   [facility] Birdie Sons, MD   1 year ago Type 2 diabetes mellitus with diabetic nephropathy, without long-term current use of insulin ([place])   [facility] Birdie Sons, MD   1 year ago Type 2 diabetes mellitus with diabetic nephropathy, without long-term current use of insulin ([place])   [facility] Birdie Sons, MD              Passed - Urine Drug Screen completed in last 360 days         Requested Prescriptions  Pending Prescriptions Disp Refills   oxyCODONE-acetaminophen (PERCOCET) 10-325 MG tablet [Pharmacy Med Name: OXYCODONE-APAP 10-325 MG TAB] 180 tablet     Sig: TAKE 1 TABLET BY MOUTH EVERY 4 HOURS AS NEEDED FOR PAIN     Not Delegated - Analgesics:  Opioid Agonist Combinations Failed - 09/30/2021  9:04 AM      Failed - This refill cannot be delegated      Failed - Valid  encounter within last 6 months    Recent Outpatient Visits           6 months ago Type 2 diabetes mellitus with diabetic nephropathy, without long-term current use of insulin ([place])   [facility] Birdie Sons, MD   9 months ago Type 2 diabetes mellitus with diabetic nephropathy, without long-term current use of insulin [facility])   [facility] Birdie Sons, MD   11 months ago Chest wall pain   [facility] Birdie Sons, MD   1 year ago Type 2 diabetes mellitus with diabetic nephropathy, without long-term current use of insulin [facility])   [facility] Birdie Sons, MD   1 year ago Type 2 diabetes mellitus with diabetic nephropathy, without long-term current use of insulin [facility])   [place], MD              Passed - Urine Drug Screen completed in last 360 days

## 2021-09-30 NOTE — Telephone Encounter (Signed)
Copied from [place] [id number]. Topic: Quick Communication - Rx Refill/Question >> Sep 30, 2021  9:30 AM Yvette Rack wrote: Medication: ALPRAZolam Duanne Moron) 1 MG tablet  Has the patient contacted their pharmacy? Yes.  Pt stated he requested refill and he was told the request was submitted (Agent: If no, request that the patient contact the pharmacy for the refill. If patient does not wish to contact the pharmacy document the reason why and proceed with request.) (Agent: If yes, when and what did the pharmacy advise?)  Preferred Pharmacy (with phone number or street name): [place], [place] Phone: [phone]   Fax: [phone]  Has the patient been seen for an appointment in the last year OR does the patient have an upcoming appointment? Yes.    Agent: Please be advised that RX refills may take up to 3 business days. We ask that you follow-up with your pharmacy.

## 2021-10-03 DIAGNOSIS — J449 Chronic obstructive pulmonary disease, unspecified: Secondary | ICD-10-CM | POA: Diagnosis not present

## 2021-10-06 ENCOUNTER — Encounter: Payer: Self-pay | Admitting: Oncology

## 2021-10-15 ENCOUNTER — Other Ambulatory Visit: Payer: Self-pay | Admitting: Family Medicine

## 2021-10-15 ENCOUNTER — Ambulatory Visit: Payer: Self-pay

## 2021-10-15 NOTE — Telephone Encounter (Signed)
2nd attempt to reach pt. Left VM to call back to discuss symptoms.

## 2021-10-15 NOTE — Telephone Encounter (Signed)
3rd attempt to reach pt. LMOM and forwarded to practice.

## 2021-10-15 NOTE — Telephone Encounter (Signed)
Pt would like Dr. [name] to send in an RX for cough and congestion / please advise    Left message to call back.

## 2021-10-23 DIAGNOSIS — I480 Paroxysmal atrial fibrillation: Secondary | ICD-10-CM | POA: Diagnosis not present

## 2021-10-23 DIAGNOSIS — J418 Mixed simple and mucopurulent chronic bronchitis: Secondary | ICD-10-CM | POA: Diagnosis not present

## 2021-10-23 DIAGNOSIS — I509 Heart failure, unspecified: Secondary | ICD-10-CM | POA: Diagnosis not present

## 2021-10-23 DIAGNOSIS — I214 Non-ST elevation (NSTEMI) myocardial infarction: Secondary | ICD-10-CM | POA: Diagnosis not present

## 2021-10-23 DIAGNOSIS — I25118 Atherosclerotic heart disease of native coronary artery with other forms of angina pectoris: Secondary | ICD-10-CM | POA: Diagnosis not present

## 2021-10-23 DIAGNOSIS — I10 Essential (primary) hypertension: Secondary | ICD-10-CM | POA: Diagnosis not present

## 2021-10-28 ENCOUNTER — Other Ambulatory Visit: Payer: Self-pay | Admitting: Family Medicine

## 2021-10-28 DIAGNOSIS — M5410 Radiculopathy, site unspecified: Secondary | ICD-10-CM

## 2021-10-28 NOTE — Telephone Encounter (Signed)
Requested medication (s) are due for refill today: 10/30/21  Requested medication (s) are on the active medication list: yes   Last refill: 09/30/21  #30     Future visit scheduled   yes 12/11/21  Notes to clinic:Not delegated  Requested Prescriptions  Pending Prescriptions Disp Refills   oxyCODONE-acetaminophen (PERCOCET) 10-325 MG tablet [Pharmacy Med Name: OXYCODONE-APAP 10-325 MG TAB] 180 tablet     Sig: TAKE 1 TABLET BY MOUTH EVERY 4 HOURS AS NEEDED FOR PAIN     Not Delegated - Analgesics:  Opioid Agonist Combinations Failed - 10/28/2021 10:02 AM      Failed - This refill cannot be delegated      Failed - Valid encounter within last 6 months    Recent Outpatient Visits           7 months ago Type 2 diabetes mellitus with diabetic nephropathy, without long-term current use of insulin ([place])   [facility] Birdie Sons, MD   10 months ago Type 2 diabetes mellitus with diabetic nephropathy, without long-term current use of insulin ([place])   [facility] Birdie Sons, MD   1 year ago Chest wall pain   [facility] Birdie Sons, MD   1 year ago Type 2 diabetes mellitus with diabetic nephropathy, without long-term current use of insulin ([place])   [facility] Birdie Sons, MD   1 year ago Type 2 diabetes mellitus with diabetic nephropathy, without long-term current use of insulin [facility])   [facility] Birdie Sons, MD       Future Appointments             In 1 month [name], [name], MD [name]            Passed - Urine Drug Screen completed in last 360 days

## 2021-10-30 ENCOUNTER — Telehealth: Payer: Self-pay

## 2021-10-30 ENCOUNTER — Ambulatory Visit (INDEPENDENT_AMBULATORY_CARE_PROVIDER_SITE_OTHER): Payer: Medicare HMO

## 2021-10-30 DIAGNOSIS — E1121 Type 2 diabetes mellitus with diabetic nephropathy: Secondary | ICD-10-CM

## 2021-10-30 DIAGNOSIS — I509 Heart failure, unspecified: Secondary | ICD-10-CM

## 2021-10-30 NOTE — Telephone Encounter (Signed)
°  Care Management   Follow Up Note   10/30/2021 Name: Lawrence [name] MRN: [id number] DOB: [birth year]   Primary Care Provider: Birdie Sons, MD Reason for referral : Chronic Care Management  An unsuccessful telephone outreach was attempted today. The patient was referred to the case management team for assistance with care management and care coordination.    Follow Up Plan:  A HIPAA compliant voice message was left today requesting a return call.   [facility] Care Management [facility] [phone]

## 2021-10-30 NOTE — Chronic Care Management (AMB) (Signed)
Chronic Care Management   CCM RN Visit Note  10/30/2021 Name: Lawrence [name] MRN: [id number] DOB: [birth year]  Subjective: Lawrence [name] is a 65 y.o. year old male who is a primary care patient of [name], [name], MD. The care management team was consulted for assistance with disease management and care coordination needs.    Engaged with patient by telephone for follow up visit in response to provider referral for case management and care coordination services.   Consent to Services:  The patient was given information about Chronic Care Management services, agreed to services, and gave verbal consent prior to initiation of services.  Please see initial visit note for detailed documentation.    Assessment: Review of patient past medical history, allergies, medications, health status, including review of consultants reports, laboratory and other test data, was performed as part of comprehensive evaluation and provision of chronic care management services.   SDOH (Social Determinants of Health) assessments and interventions performed:  No  CCM Care Plan  Allergies  Allergen Reactions   Demerol  [Meperidine Hcl]    Demerol [Meperidine] Hives   Jardiance [Empagliflozin] Other (See Comments)    Perineal pain   Prednisone Other (See Comments) and Hypertension    Pt states that this medication puts him in A-fib    Sulfa Antibiotics Hives   Albuterol Sulfate [Albuterol] Palpitations and Other (See Comments)    Pt currently uses this medication.     Morphine Sulfate Nausea And Vomiting, Rash and Other (See Comments)    Pt states that he is only allergic to the tablet form of this medication.      Outpatient Encounter Medications as of 10/30/2021  Medication Sig Note   Accu-Chek Softclix Lancets lancets Use as instructed to check sugar daily for type 2 diabetes.    allopurinol (ZYLOPRIM) 300 MG tablet TAKE 1 TABLET BY MOUTH TWICE A DAY    ALPRAZolam (XANAX) 1 MG tablet Take 1 tablet  (1 mg total) by mouth 3 (three) times daily.    amLODipine (NORVASC) 2.5 MG tablet TAKE 1 TABLET BY MOUTH DAILY    aspirin 81 MG chewable tablet Chew 81 mg by mouth daily. Swallows whole    atorvastatin (LIPITOR) 80 MG tablet TAKE ONE TABLET BY MOUTH AT BEDTIME    Blood Glucose Calibration (ACCU-CHEK GUIDE CONTROL) LIQD Use with blood glucose monitor as directed    Blood Glucose Monitoring Suppl (ACCU-CHEK GUIDE) w/Device KIT Use to check blood sugars as directed    cetirizine (ZYRTEC) 10 MG tablet TAKE ONE TABLET BY MOUTH AT BEDTIME    docusate sodium (COLACE) 100 MG capsule Take 100 mg by mouth daily as needed for mild constipation.    ELIQUIS 5 MG TABS tablet TAKE 1 TABLET BY MOUTH TWICE A DAY    FARXIGA 10 MG TABS tablet TAKE 1 TABLET BY MOUTH DAILY    fluticasone furoate-vilanterol (BREO ELLIPTA) 100-25 MCG/INH AEPB INHALE 1 PUFF BY MOUTH INTO THE LUNGS DAILY.    glucose blood (ACCU-CHEK GUIDE) test strip Use as instructed to check sugar daily for type 2 diabetes.    isosorbide mononitrate (IMDUR) 120 MG 24 hr tablet TAKE 1 TABLET BY MOUTH DAILY    linaclotide (LINZESS) 72 MCG capsule Take 72 mcg by mouth daily before breakfast.     lisinopril (ZESTRIL) 10 MG tablet TAKE 1 TABLET BY MOUTH DAILY    metFORMIN (GLUCOPHAGE-XR) 500 MG 24 hr tablet TAKE 1 TABLET BY MOUTH TWICE A DAY    methocarbamol (  ROBAXIN) 500 MG tablet TAKE 1 TABLET BY MOUTH EVERY 8 HOURS AS NEEDED FOR MUSCLE SPASMS    naloxone (NARCAN) nasal spray 4 mg/0.1 mL 1 spray into nostril x1 and may repeat every 2-3 minutes until patient is responsive or EMS arrives    nitroGLYCERIN (NITROSTAT) 0.4 MG SL tablet PLACE ONE TABLET UNDER THE TONGUE EVERY 5 MINUTES FOR CHEST PAIN. IF NO RELIEF PAST 3RD TAB GO TO EMERGENCY ROOM    omega-3 acid ethyl esters (LOVAZA) 1 g capsule TAKE 4 CAPSULES BY MOUTH DAILY    omeprazole (PRILOSEC) 40 MG capsule Take 40 mg by mouth 2 (two) times daily.    oxyCODONE-acetaminophen (PERCOCET) 10-325 MG tablet  TAKE 1 TABLET BY MOUTH EVERY 4 HOURS AS NEEDED FOR PAIN    ranolazine (RANEXA) 1000 MG SR tablet Take 1 tablet (1,000 mg total) by mouth 2 (two) times daily.    sotalol (BETAPACE) 80 MG tablet Take 40 mg by mouth 2 (two) times daily.    tiotropium (SPIRIVA HANDIHALER) 18 MCG inhalation capsule Place 1 capsule (18 mcg total) into inhaler and inhale daily.    torsemide (DEMADEX) 100 MG tablet Take 0.5 tablets (50 mg total) by mouth daily. 01/15/2021: Reports taking as needed.   traZODone (DESYREL) 150 MG tablet TAKE 1 TABLET BY MOUTH AT BEDTIME    venlafaxine XR (EFFEXOR-XR) 75 MG 24 hr capsule TAKE 1 CAPSULE BY MOUTH DAILY WITH BREAKFAST    [DISCONTINUED] albuterol (VENTOLIN HFA) 108 (90 Base) MCG/ACT inhaler INHALE 2 PUFFS BY MOUTH EVERY 6 HOURS AS NEEDED FOR SHORTNESS OF BREATH (Patient not taking: No sig reported)    No facility-administered encounter medications on file as of 10/30/2021.    Patient Active Problem List   Diagnosis Date Noted   Chronic diastolic heart failure ([place])    Hypertensive urgency 04/27/2021   Atherosclerosis of aorta ([place]) 12/16/2020   Syncope 12/11/2020   Acidosis 12/11/2020   Polycythemia 10/05/2020   NSTEMI (non-ST elevated myocardial infarction) ([place]) 09/26/2020   Primary insomnia 05/13/2020   Recurrent major depressive disorder, remission status unspecified ([place]) 03/29/2020   Elevated troponin 03/07/2020   Bereavement 09/09/2019   Chronic respiratory failure with hypoxia ([place]) 05/29/2019   Morbid obesity ([place]) 05/29/2019   Trigger thumb of right hand 11/28/2018   Eye pain, left 08/18/2018   Acute on chronic heart failure ([place]) 04/18/2018   History of adenomatous polyp of colon 04/05/2018   Abdominal pain, chronic, epigastric 11/06/2017   Bilateral flank pain 03/24/2017   Dyspnea 04/03/2016   Hypotension 04/03/2016   CKD (chronic kidney disease) stage 2, GFR 60-89 ml/min 04/03/2016   Anemia 04/03/2016   Atrial fibrillation ([place]) 12/23/2015   OSA  (obstructive sleep apnea) 12/10/2015   Left inguinal hernia 11/07/2015   Anxiety 11/07/2015   Unstable angina ([place]) 10/05/2015   Back pain with left-sided radiculopathy 09/30/2015   Nocturnal hypoxia 09/06/2015   BPH (benign prostatic hyperplasia) 16/08/9603   Chronic systolic CHF (congestive heart failure) (HCC)    Angina pectoris ([place])    Chest pain 07/11/2015   COPD (chronic obstructive pulmonary disease) ([place]) 07/03/2015   CAD (coronary artery disease) 06/26/2015   Uncontrolled hypertension 06/26/2015   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[place]) 06/26/2015   Achalasia 07/24/2014   GERD (gastroesophageal reflux disease) 06/07/2014   Former tobacco use 04/11/2013   HLD (hyperlipidemia) 04/09/2013   Patient Care Plan: RN Care Management Plan of Care     Problem Identified: A-Fib, CHF, COPD, HLD, HTN  Long-Range Goal: Disease Progression Prevented or Minimized   Start Date: 10/30/2021  Expected End Date: 01/28/2022  Priority: High  Note:   Current Barriers:  Chronic Disease Management support and education needs related to Atrial Fibrillation, CHF, HTN, DM and COPD   RNCM Clinical Goal(s):  Patient will demonstrate ongoing adherence to prescribed treatment plan for Atrial Fibrillation, CHF, HTN, COPD, and DM through collaboration with the provider, RN Care manager, and the care management team.  Interventions: 1:1 collaboration with primary care provider regarding development and update of comprehensive plan of care as evidenced by provider attestation and co-signature Inter-disciplinary care team collaboration (see longitudinal plan of care) Evaluation of current treatment plan related to  self management and patient's adherence to plan as established by provider   AFIB Interventions: (Status:  Condition stable.  Not addressed this visit.) Long Term Goal Discussed increased risk of stroke due to Afib and benefits of anticoagulation for stroke prevention Reviewed  importance of adherence to anticoagulant exactly as prescribed Discussed bleeding risk associated with Eliquis and importance of self-monitoring for signs/symptoms of bleeding Discussed need to avoid NSAIDs due to increased bleeding risk with anticoagulants Discussed importance of regular laboratory monitoring as prescribed Advised to seek immediate medical attention after a head injury or if there is blood in the urine/stool   Heart Failure Interventions:  (Status:  Goal on track:  Yes.) Long Term Goal  Wt Readings from Last 3 Encounters:  05/01/21 214 lb 1.1 oz (97.1 kg)  03/10/21 220 lb (99.8 kg)  01/03/21 223 lb 8 oz (101.4 kg)    Discussed current plan for CHF management.  Provided information regarding recommended weight parameters. Encouraged to weigh daily and record readings. Encouraged to notify provider for weight gain greater than 3 lbs overnight or weight gain greater than 5 lbs within a week. Reports recent weights have been stable. Reports current weight of 204 lbs. Discussed s/sx of fluid overload and indications for notifying a provider. Encouraged to assess symptoms daily and contact clinic with concerns as needed. Discussed nutritional intake. Advised to closely monitor sodium consumption and avoid highly processed foods when possible. Reviewed worsening s/sx related to CHF exacerbation and indications for seeking immediate medical attention.   COPD Interventions:  (Status:  Condition stable.  Not addressed this visit.) Long Term Goal Reviewed medications and plan for COPD management. Advised to track and manage COPD triggers Advised to self assesses COPD action plan zone and make appointment with provider if in the yellow zone for 48 hours without improvement. Discussed increased risk for respiratory complications. Advised to utilize infection prevention strategies to reduce risk of respiratory infection. Discussed the importance of adequate rest and management of  fatigue with COPD.   Diabetes Interventions:   Assessed patient's understanding of A1c goal: <7% Lab Results  Component Value Date   HGBA1C 7.4 (H) 04/27/2021  Reviewed medications. Encouraged to take medications as prescribed and notify team with concerns regarding prescription cost. Provided information regarding importance of consistent blood glucose monitoring. Encouraged to monitor and maintain a log. Reviewed s/sx of hypoglycemia and hyperglycemia along with recommended interventions. Discussed nutritional intake. Reports improved compliance with the recommended diabetic diet. Declined need for assistance with nutritional resource.   Hypertension Interventions:  (Status:  Condition stable.  Not addressed this visit.) Long Term Goal Last practice recorded BP readings:  BP Readings from Last 3 Encounters:  05/02/21 (!) 141/92  03/10/21 130/79  01/03/21 129/77  Most recent eGFR/CrCl: No results found for: EGFR  No  components found for: CRCL  Reviewed medications. Encouraged to continue taking as prescribed and notify provider if unable to tolerate prescribed regimen.  Provided information regarding established blood pressure parameters along with indications for notifying a provider. Encouraged to continue monitoring and recording reading. Discussed compliance with recommended cardiac prudent diet. Encouraged to continue monitoring sodium intake. Encouraged to continue reading nutrition labels and avoid highly processed foods when possible. Reviewed s/sx of heart attack, stroke and worsening symptoms that require immediate medical attention.   Patient Goals/Self-Care Activities: Take all medications as prescribed Attend all scheduled provider appointments Call pharmacy for medication refills 3-7 days in advance of running out of medications Perform all self care activities independently  Call provider office for new concerns or questions    Follow Up Plan:   Will follow up in  three months      PLAN A member of the care management team will follow up in three months.   [facility] Care Management [facility] [phone]

## 2021-11-02 DIAGNOSIS — J449 Chronic obstructive pulmonary disease, unspecified: Secondary | ICD-10-CM | POA: Diagnosis not present

## 2021-11-04 ENCOUNTER — Other Ambulatory Visit: Payer: Self-pay | Admitting: Family Medicine

## 2021-11-04 DIAGNOSIS — R0789 Other chest pain: Secondary | ICD-10-CM

## 2021-11-05 ENCOUNTER — Other Ambulatory Visit: Payer: Self-pay | Admitting: Family Medicine

## 2021-11-05 DIAGNOSIS — R0789 Other chest pain: Secondary | ICD-10-CM

## 2021-11-05 NOTE — Telephone Encounter (Signed)
Refill not delegated.

## 2021-11-05 NOTE — Telephone Encounter (Signed)
LOV: 03/10/2021   NOV: 12/01/2021   Last Refill:  07/07/2021 #90 1 Refill  Thanks,   Marland KitchenMickel Baas

## 2021-11-06 NOTE — Telephone Encounter (Signed)
LOV: 03/10/2021   NOV: 12/01/2021   Last Refill:  07/07/2021  #90 1 Refill   Thanks,   Marland KitchenMickel Baas

## 2021-11-15 DIAGNOSIS — E1121 Type 2 diabetes mellitus with diabetic nephropathy: Secondary | ICD-10-CM | POA: Diagnosis not present

## 2021-11-15 DIAGNOSIS — I509 Heart failure, unspecified: Secondary | ICD-10-CM

## 2021-11-25 ENCOUNTER — Other Ambulatory Visit: Payer: Self-pay | Admitting: Family Medicine

## 2021-11-25 DIAGNOSIS — F419 Anxiety disorder, unspecified: Secondary | ICD-10-CM

## 2021-11-25 DIAGNOSIS — M5410 Radiculopathy, site unspecified: Secondary | ICD-10-CM

## 2021-11-25 MED ORDER — OXYCODONE-ACETAMINOPHEN 10-325 MG PO TABS: 1.0000 | ORAL_TABLET | ORAL | 0 refills | Status: DC | PRN

## 2021-11-25 MED ORDER — ALPRAZOLAM 1 MG PO TABS: 1.0000 mg | ORAL_TABLET | Freq: Three times a day (TID) | ORAL | 3 refills | Status: DC

## 2021-11-25 NOTE — Telephone Encounter (Signed)
Requested medication (s) are due for refill today - no  Requested medication (s) are on the active medication list -yes  Future visit scheduled -yes  Last refill: Alprazolam- 09/30/21 #90 3 RF                 Oxycodone- 10/28/21 #180  Notes to clinic: Request RF: non delegated Rx  Requested Prescriptions  Pending Prescriptions Disp Refills   ALPRAZolam (XANAX) 1 MG tablet 90 tablet 3    Sig: Take 1 tablet (1 mg total) by mouth 3 (three) times daily.     Not Delegated - Psychiatry:  Anxiolytics/Hypnotics Failed - 11/25/2021 11:53 AM      Failed - This refill cannot be delegated      Failed - Valid encounter within last 6 months    Recent Outpatient Visits           8 months ago Type 2 diabetes mellitus with diabetic nephropathy, without long-term current use of insulin [facility])   [facility] Birdie Sons, MD   11 months ago Type 2 diabetes mellitus with diabetic nephropathy, without long-term current use of insulin ([place])   [facility] Birdie Sons, MD   1 year ago Chest wall pain   [facility] Birdie Sons, MD   1 year ago Type 2 diabetes mellitus with diabetic nephropathy, without long-term current use of insulin ([place])   [facility] Birdie Sons, MD   1 year ago Type 2 diabetes mellitus with diabetic nephropathy, without long-term current use of insulin [facility])   [facility] Birdie Sons, MD       Future Appointments             In 3 weeks [name], [name], MD [name]            Passed - Urine Drug Screen completed in last 360 days       oxyCODONE-acetaminophen (PERCOCET) 10-325 MG tablet 180 tablet 0    Sig: Take 1 tablet by mouth every 4 (four) hours as needed. for pain     Not Delegated - Analgesics:  Opioid Agonist Combinations Failed - 11/25/2021 11:53 AM      Failed - This refill cannot be delegated      Failed - Valid encounter within last 6  months    Recent Outpatient Visits           8 months ago Type 2 diabetes mellitus with diabetic nephropathy, without long-term current use of insulin [facility])   [facility] Birdie Sons, MD   11 months ago Type 2 diabetes mellitus with diabetic nephropathy, without long-term current use of insulin ([place])   [facility] Birdie Sons, MD   1 year ago Chest wall pain   [facility] Birdie Sons, MD   1 year ago Type 2 diabetes mellitus with diabetic nephropathy, without long-term current use of insulin [facility])   [facility] Birdie Sons, MD   1 year ago Type 2 diabetes mellitus with diabetic nephropathy, without long-term current use of insulin [facility])   [place], MD       Future Appointments             In 3 weeks [name], [name], MD [facility] [place] completed in last 360 days  Requested Prescriptions  Pending Prescriptions Disp Refills   ALPRAZolam (XANAX) 1 MG tablet 90 tablet 3    Sig: Take 1 tablet (1 mg total) by mouth 3 (three) times daily.     Not Delegated - Psychiatry:  Anxiolytics/Hypnotics Failed - 11/25/2021 11:53 AM      Failed - This refill cannot be delegated      Failed - Valid encounter within last 6 months    Recent Outpatient Visits           8 months ago Type 2 diabetes mellitus with diabetic nephropathy, without long-term current use of insulin [facility])   [facility] Birdie Sons, MD   11 months ago Type 2 diabetes mellitus with diabetic nephropathy, without long-term current use of insulin ([place])   [facility] Birdie Sons, MD   1 year ago Chest wall pain   [facility] Birdie Sons, MD   1 year ago Type 2 diabetes mellitus with diabetic nephropathy, without long-term current use of insulin ([place])   [facility] Birdie Sons, MD   1 year ago Type 2 diabetes mellitus with diabetic nephropathy, without long-term current use of insulin [facility])   [facility] Birdie Sons, MD       Future Appointments             In 3 weeks [name], [name], MD [name]            Passed - Urine Drug Screen completed in last 360 days       oxyCODONE-acetaminophen (PERCOCET) 10-325 MG tablet 180 tablet 0    Sig: Take 1 tablet by mouth every 4 (four) hours as needed. for pain     Not Delegated - Analgesics:  Opioid Agonist Combinations Failed - 11/25/2021 11:53 AM      Failed - This refill cannot be delegated      Failed - Valid encounter within last 6 months    Recent Outpatient Visits           8 months ago Type 2 diabetes mellitus with diabetic nephropathy, without long-term current use of insulin [facility])   [facility] Birdie Sons, MD   11 months ago Type 2 diabetes mellitus with diabetic nephropathy, without long-term current use of insulin ([place])   [facility] Birdie Sons, MD   1 year ago Chest wall pain   [facility] Birdie Sons, MD   1 year ago Type 2 diabetes mellitus with diabetic nephropathy, without long-term current use of insulin [facility])   [facility] Birdie Sons, MD   1 year ago Type 2 diabetes mellitus with diabetic nephropathy, without long-term current use of insulin [facility])   [place], MD       Future Appointments             In 3 weeks [name], [name], MD [facility] [place] completed in last 360 days

## 2021-11-25 NOTE — Telephone Encounter (Signed)
Copied from [place] [phone]. Topic: Quick Communication - Rx Refill/Question >> Nov 25, 2021 10:28 AM Leward Quan A wrote: Medication: oxyCODONE-acetaminophen (PERCOCET) 10-325 MG tablet, ALPRAZolam Duanne Moron) 1 MG tablet    Has the patient contacted their pharmacy? No. Need new Rx  (Agent: If no, request that the patient contact the pharmacy for the refill. If patient does not wish to contact the pharmacy document the reason why and proceed with request.) (Agent: If yes, when and what did the pharmacy advise?)  Preferred Pharmacy (with phone number or street name): [place], [place]  Phone:  [phone] Fax:  [phone]    Has the patient been seen for an appointment in the last year OR does the patient have an upcoming appointment? Yes.    Agent: Please be advised that RX refills may take up to 3 business days. We ask that you follow-up with your pharmacy.

## 2021-12-01 ENCOUNTER — Ambulatory Visit: Payer: Medicare HMO | Admitting: Family Medicine

## 2021-12-02 ENCOUNTER — Other Ambulatory Visit: Payer: Self-pay | Admitting: Family Medicine

## 2021-12-02 NOTE — Telephone Encounter (Signed)
Requested Prescriptions  Pending Prescriptions Disp Refills   nitroGLYCERIN (NITROSTAT) 0.4 MG SL tablet [Pharmacy Med Name: NITROGLYCERIN 0.4 MG SL TAB] 30 tablet 3    Sig: PLACE ONE TABLET UNDER THE TONGUE EVERY 5 MINUTES FOR CHEST PAIN. IF NO RELIEF PAST 3RD TAB GO TO EMERGENCY ROOM     Cardiovascular:  Nitrates Failed - 12/02/2021  4:10 PM      Failed - Last BP in normal range    BP Readings from Last 1 Encounters:  05/02/21 (!) 141/92         Passed - Last Heart Rate in normal range    Pulse Readings from Last 1 Encounters:  05/02/21 (!) 56         Passed - Valid encounter within last 12 months    Recent Outpatient Visits          8 months ago Type 2 diabetes mellitus with diabetic nephropathy, without long-term current use of insulin ([place])   [facility] Birdie Sons, MD   11 months ago Type 2 diabetes mellitus with diabetic nephropathy, without long-term current use of insulin ([place])   [facility] Birdie Sons, MD   1 year ago Chest wall pain   [facility] Birdie Sons, MD   1 year ago Type 2 diabetes mellitus with diabetic nephropathy, without long-term current use of insulin ([place])   [facility] Birdie Sons, MD   1 year ago Type 2 diabetes mellitus with diabetic nephropathy, without long-term current use of insulin ([place])   [place], [name], MD      Future Appointments            In 2 weeks [name], [name], MD [name]            albuterol (VENTOLIN HFA) 108 (90 Base) MCG/ACT inhaler [Pharmacy Med Name: ALBUTEROL SULFATE HFA 108 (90 BASE)] 17 g     Sig: INHALE 2 PUFFS BY MOUTH EVERY 6 HOURS AS NEEDED FOR SHORTNESS OF BREATH     Pulmonology:  Beta Agonists Failed - 12/02/2021  4:10 PM      Failed - One inhaler should last at least one month. If the patient is requesting refills earlier, contact the patient to check for uncontrolled symptoms.       Passed - Valid encounter within last 12 months    Recent Outpatient Visits          8 months ago Type 2 diabetes mellitus with diabetic nephropathy, without long-term current use of insulin ([place])   [facility] Birdie Sons, MD   11 months ago Type 2 diabetes mellitus with diabetic nephropathy, without long-term current use of insulin ([place])   [facility] Birdie Sons, MD   1 year ago Chest wall pain   [facility] Birdie Sons, MD   1 year ago Type 2 diabetes mellitus with diabetic nephropathy, without long-term current use of insulin [facility])   [facility] Birdie Sons, MD   1 year ago Type 2 diabetes mellitus with diabetic nephropathy, without long-term current use of insulin [facility])   [facility] Birdie Sons, MD      Future Appointments            In 2 weeks [name], [name], MD [name]

## 2021-12-02 NOTE — Telephone Encounter (Signed)
Requested medication (s) are due for refill today:unsure  Requested medication (s) are on the active medication list: no not on current med profile    Last refill: 12/22/19  17   Future visit scheduled yes 12/19/21  Notes to clinic:Not on med profile;please review. Thank you  Requested Prescriptions  Pending Prescriptions Disp Refills   albuterol (VENTOLIN HFA) 108 (90 Base) MCG/ACT inhaler [Pharmacy Med Name: ALBUTEROL SULFATE HFA 108 (90 BASE)] 17 g     Sig: INHALE 2 PUFFS BY MOUTH EVERY 6 HOURS AS NEEDED FOR SHORTNESS OF BREATH     Pulmonology:  Beta Agonists Failed - 12/02/2021  4:10 PM      Failed - One inhaler should last at least one month. If the patient is requesting refills earlier, contact the patient to check for uncontrolled symptoms.      Passed - Valid encounter within last 12 months    Recent Outpatient Visits           8 months ago Type 2 diabetes mellitus with diabetic nephropathy, without long-term current use of insulin ([place])   [facility] Birdie Sons, MD   11 months ago Type 2 diabetes mellitus with diabetic nephropathy, without long-term current use of insulin ([place])   [facility] Birdie Sons, MD   1 year ago Chest wall pain   [facility] Birdie Sons, MD   1 year ago Type 2 diabetes mellitus with diabetic nephropathy, without long-term current use of insulin ([place])   [facility] Birdie Sons, MD   1 year ago Type 2 diabetes mellitus with diabetic nephropathy, without long-term current use of insulin [facility])   [facility] Birdie Sons, MD       Future Appointments             In 2 weeks [name], [name], MD [name]            Signed Prescriptions Disp Refills   nitroGLYCERIN (NITROSTAT) 0.4 MG SL tablet 30 tablet 3    Sig: PLACE ONE TABLET UNDER THE TONGUE EVERY 5 MINUTES FOR CHEST PAIN. IF NO RELIEF PAST 3RD TAB GO TO EMERGENCY ROOM      Cardiovascular:  Nitrates Failed - 12/02/2021  4:10 PM      Failed - Last BP in normal range    BP Readings from Last 1 Encounters:  05/02/21 (!) 141/92          Passed - Last Heart Rate in normal range    Pulse Readings from Last 1 Encounters:  05/02/21 (!) 109          Passed - Valid encounter within last 12 months    Recent Outpatient Visits           8 months ago Type 2 diabetes mellitus with diabetic nephropathy, without long-term current use of insulin ([place])   [facility] Birdie Sons, MD   11 months ago Type 2 diabetes mellitus with diabetic nephropathy, without long-term current use of insulin ([place])   [facility] Birdie Sons, MD   1 year ago Chest wall pain   [facility] Birdie Sons, MD   1 year ago Type 2 diabetes mellitus with diabetic nephropathy, without long-term current use of insulin [facility])   [facility] Birdie Sons, MD   1 year ago Type 2 diabetes mellitus with diabetic nephropathy, without long-term current use of insulin ([place])  [place], MD       Future Appointments             In 2 weeks [name], [name], MD [facility], [place]

## 2021-12-03 DIAGNOSIS — J449 Chronic obstructive pulmonary disease, unspecified: Secondary | ICD-10-CM | POA: Diagnosis not present

## 2021-12-16 ENCOUNTER — Other Ambulatory Visit: Payer: Self-pay | Admitting: Family Medicine

## 2021-12-16 DIAGNOSIS — F419 Anxiety disorder, unspecified: Secondary | ICD-10-CM

## 2021-12-19 ENCOUNTER — Ambulatory Visit: Payer: Medicare HMO | Admitting: Family Medicine

## 2021-12-22 ENCOUNTER — Telehealth: Payer: Self-pay

## 2021-12-22 ENCOUNTER — Other Ambulatory Visit: Payer: Self-pay

## 2021-12-22 DIAGNOSIS — I10 Essential (primary) hypertension: Secondary | ICD-10-CM

## 2021-12-22 DIAGNOSIS — E1121 Type 2 diabetes mellitus with diabetic nephropathy: Secondary | ICD-10-CM

## 2021-12-22 DIAGNOSIS — E785 Hyperlipidemia, unspecified: Secondary | ICD-10-CM

## 2021-12-22 NOTE — Telephone Encounter (Signed)
Left message for patient that we need to change his appointment to virtual due to Dr Ky Barban only able to work from home

## 2021-12-23 ENCOUNTER — Ambulatory Visit (INDEPENDENT_AMBULATORY_CARE_PROVIDER_SITE_OTHER): Payer: Medicare HMO | Admitting: Family Medicine

## 2021-12-23 ENCOUNTER — Other Ambulatory Visit: Payer: Self-pay

## 2021-12-23 DIAGNOSIS — I5022 Chronic systolic (congestive) heart failure: Secondary | ICD-10-CM | POA: Diagnosis not present

## 2021-12-23 DIAGNOSIS — E1121 Type 2 diabetes mellitus with diabetic nephropathy: Secondary | ICD-10-CM | POA: Diagnosis not present

## 2021-12-23 DIAGNOSIS — E785 Hyperlipidemia, unspecified: Secondary | ICD-10-CM | POA: Diagnosis not present

## 2021-12-23 DIAGNOSIS — E1165 Type 2 diabetes mellitus with hyperglycemia: Secondary | ICD-10-CM | POA: Diagnosis not present

## 2021-12-23 DIAGNOSIS — J449 Chronic obstructive pulmonary disease, unspecified: Secondary | ICD-10-CM | POA: Diagnosis not present

## 2021-12-23 DIAGNOSIS — I10 Essential (primary) hypertension: Secondary | ICD-10-CM | POA: Diagnosis not present

## 2021-12-23 DIAGNOSIS — G4733 Obstructive sleep apnea (adult) (pediatric): Secondary | ICD-10-CM | POA: Diagnosis not present

## 2021-12-23 DIAGNOSIS — I200 Unstable angina: Secondary | ICD-10-CM

## 2021-12-23 DIAGNOSIS — I214 Non-ST elevation (NSTEMI) myocardial infarction: Secondary | ICD-10-CM | POA: Diagnosis not present

## 2021-12-23 DIAGNOSIS — Z87891 Personal history of nicotine dependence: Secondary | ICD-10-CM | POA: Diagnosis not present

## 2021-12-23 DIAGNOSIS — N182 Chronic kidney disease, stage 2 (mild): Secondary | ICD-10-CM

## 2021-12-23 DIAGNOSIS — I4891 Unspecified atrial fibrillation: Secondary | ICD-10-CM | POA: Diagnosis not present

## 2021-12-23 MED ORDER — PREDNISONE 20 MG PO TABS: 40.0000 mg | ORAL_TABLET | Freq: Every day | ORAL | 0 refills | Status: AC

## 2021-12-23 NOTE — Assessment & Plan Note (Signed)
Taking torsemide as needed. Continue to follow with Cardiology.

## 2021-12-23 NOTE — Progress Notes (Signed)
Virtual telephone visit    Virtual Visit via Telephone Note   This visit type was conducted due to national recommendations for restrictions regarding the COVID-19 Pandemic (e.g. social distancing) in an effort to limit this patient's exposure and mitigate transmission in our community. Due to his co-morbid illnesses, this patient is at least at moderate risk for complications without adequate follow up. This format is felt to be most appropriate for this patient at this time. The patient did not have access to video technology or had technical difficulties with video requiring transitioning to audio format only (telephone). Physical exam was limited to content and character of the telephone converstion.    Patient location: home Provider location: home  I discussed the limitations of evaluation and management by telemedicine and the availability of in person appointments. The patient expressed understanding and agreed to proceed.   Visit Date: 12/23/2021  Today's healthcare provider: Myles Gip, DO   Chief Complaint  Patient presents with   Follow-up   Subjective    HPI HPI   DM and HTN Last edited by Doristine Devoid, CMA on 12/23/2021 10:08 AM.       Hypertension, PAF, CAD w/ h/o NSTEMI, HFrEF, unstable angina, OSA: - Medications: amlodipine, imdur, lisinopril, torsemide, ASA 81, NTG prn, eliquis - Compliance: good. Hasn't had to use NTG - Checking BP at home: sometimes, 120s SBP - Denies any CP, vision changes, LE edema, medication SEs, or symptoms of hypotension, bleeding - CPAP? No, it's currently broken  Diabetes, Type 2 - Last A1c 7.4 04/2021 - Medications: farxiga, metformin - Compliance: good - Checking BG at home: no - Eye exam: due - Foot exam: due - Statin: yes - PNA vaccine: due - Denies symptoms of hypoglycemia, polyuria, polydipsia, foot ulcers/trauma  HLD - medications: lovaza, atorvastatin - compliance: good - medication SEs:  none  Emphysema/COPD - Medications: breo, albuterol prn, home O2 (some increase from 2L to 3L) - using albuterol TID for about 1.5 weeks - some change in sputum and some congestion. No fevers. - no sick contacts - Compliance: good - Exacerbations in last 6 months: 0 - no tobacco use - not using CPAP, currently broke.    Medications: Outpatient Medications Prior to Visit  Medication Sig   Accu-Chek Softclix Lancets lancets Use as instructed to check sugar daily for type 2 diabetes.   albuterol (VENTOLIN HFA) 108 (90 Base) MCG/ACT inhaler INHALE 2 PUFFS BY MOUTH EVERY 6 HOURS AS NEEDED FOR SHORTNESS OF BREATH   allopurinol (ZYLOPRIM) 300 MG tablet TAKE 1 TABLET BY MOUTH TWICE A DAY   ALPRAZolam (XANAX) 1 MG tablet Take 1 tablet (1 mg total) by mouth 3 (three) times daily.   amLODipine (NORVASC) 2.5 MG tablet TAKE 1 TABLET BY MOUTH DAILY   aspirin 81 MG chewable tablet Chew 81 mg by mouth daily. Swallows whole   atorvastatin (LIPITOR) 80 MG tablet TAKE ONE TABLET BY MOUTH AT BEDTIME   Blood Glucose Calibration (ACCU-CHEK GUIDE CONTROL) LIQD Use with blood glucose monitor as directed   Blood Glucose Monitoring Suppl (ACCU-CHEK GUIDE) w/Device KIT Use to check blood sugars as directed   cetirizine (ZYRTEC) 10 MG tablet TAKE ONE TABLET BY MOUTH AT BEDTIME   docusate sodium (COLACE) 100 MG capsule Take 100 mg by mouth daily as needed for mild constipation.   ELIQUIS 5 MG TABS tablet TAKE 1 TABLET BY MOUTH TWICE A DAY   FARXIGA 10 MG TABS tablet TAKE 1 TABLET BY MOUTH  DAILY   fluticasone furoate-vilanterol (BREO ELLIPTA) 100-25 MCG/INH AEPB INHALE 1 PUFF BY MOUTH INTO THE LUNGS DAILY.   glucose blood (ACCU-CHEK GUIDE) test strip Use as instructed to check sugar daily for type 2 diabetes.   isosorbide mononitrate (IMDUR) 120 MG 24 hr tablet TAKE 1 TABLET BY MOUTH DAILY   linaclotide (LINZESS) 72 MCG capsule Take 72 mcg by mouth daily before breakfast.    lisinopril (ZESTRIL) 10 MG tablet TAKE  1 TABLET BY MOUTH DAILY   metFORMIN (GLUCOPHAGE-XR) 500 MG 24 hr tablet TAKE 1 TABLET BY MOUTH TWICE A DAY   methocarbamol (ROBAXIN) 500 MG tablet TAKE 1 TABLET BY MOUTH EVERY 8 HOURS AS NEEDED FOR MUSCLE SPASMS   naloxone (NARCAN) nasal spray 4 mg/0.1 mL 1 spray into nostril x1 and may repeat every 2-3 minutes until patient is responsive or EMS arrives   nitroGLYCERIN (NITROSTAT) 0.4 MG SL tablet PLACE ONE TABLET UNDER THE TONGUE EVERY 5 MINUTES FOR CHEST PAIN. IF NO RELIEF PAST 3RD TAB GO TO EMERGENCY ROOM   omega-3 acid ethyl esters (LOVAZA) 1 g capsule TAKE 4 CAPSULES BY MOUTH DAILY   omeprazole (PRILOSEC) 40 MG capsule Take 40 mg by mouth 2 (two) times daily.   oxyCODONE-acetaminophen (PERCOCET) 10-325 MG tablet Take 1 tablet by mouth every 4 (four) hours as needed. for pain   ranolazine (RANEXA) 1000 MG SR tablet Take 1 tablet (1,000 mg total) by mouth 2 (two) times daily.   sotalol (BETAPACE) 80 MG tablet Take 40 mg by mouth 2 (two) times daily.   tiotropium (SPIRIVA HANDIHALER) 18 MCG inhalation capsule Place 1 capsule (18 mcg total) into inhaler and inhale daily.   torsemide (DEMADEX) 100 MG tablet Take 0.5 tablets (50 mg total) by mouth daily.   traZODone (DESYREL) 150 MG tablet TAKE 1 TABLET BY MOUTH AT BEDTIME   venlafaxine XR (EFFEXOR-XR) 75 MG 24 hr capsule TAKE 1 CAPSULE BY MOUTH DAILY WITH BREAKFAST   No facility-administered medications prior to visit.    Review of Systems per HPI    Objective    There were no vitals taken for this visit.     Assessment & Plan     Problem List Items Addressed This Visit       Cardiovascular and Mediastinum   Atrial fibrillation ([place])    Compliant with eliquis, continue.       Chronic systolic CHF (congestive heart failure) (HCC)    Taking torsemide as needed. Continue to follow with Cardiology.      NSTEMI (non-ST elevated myocardial infarction) ([place])   Unstable angina ([place])     Respiratory   COPD (chronic obstructive  pulmonary disease) ([place])    With current exacerbation with unknown trigger. Rx prednisone burst. F/u if no better.       Relevant Medications   predniSONE (DELTASONE) 20 MG tablet   OSA (obstructive sleep apnea) - Primary   Relevant Orders   Cpap titration     Endocrine   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HCC)    Recheck a1c and adjust regimen as indicated.        Genitourinary   CKD (chronic kidney disease) stage 2, GFR 60-89 ml/min    Recheck labs.        Other   Former tobacco use   HLD (hyperlipidemia)    Recheck labs. Tolerant of statin, continue.      Morbid obesity ([place])    Contributing to HTN, CHF, DM2, OSA. Recommend continued lifestyle modifications such  as diet and exercise for weight loss.         No follow-ups on file.    I discussed the assessment and treatment plan with the patient. The patient was provided an opportunity to ask questions and all were answered. The patient agreed with the plan and demonstrated an understanding of the instructions.   The patient was advised to call back or seek an in-person evaluation if the symptoms worsen or if the condition fails to improve as anticipated.  I provided 15 minutes of non-face-to-face time during this encounter.   Myles Gip, [place] [phone] (phone) [phone] (fax)  [place]

## 2021-12-23 NOTE — Assessment & Plan Note (Signed)
With current exacerbation with unknown trigger. Rx prednisone burst. F/u if no better.

## 2021-12-23 NOTE — Assessment & Plan Note (Signed)
>>  ASSESSMENT AND PLAN FOR ATRIAL FIBRILLATION WRITTEN ON 12/23/2021 10:57 AM BY RUMBALL, ALISON M, DO  Compliant with eliquis, continue.

## 2021-12-23 NOTE — Assessment & Plan Note (Signed)
>>  ASSESSMENT AND PLAN FOR PAROXYSMAL ATRIAL FIBRILLATION WRITTEN ON 03/01/2023 12:37 PM BY Malva Limes, MD  >>ASSESSMENT AND PLAN FOR ATRIAL FIBRILLATION WRITTEN ON 12/23/2021 10:57 AM BY RUMBALL, ALISON M, DO  Compliant with eliquis, continue.

## 2021-12-23 NOTE — Assessment & Plan Note (Signed)
Recheck labs 

## 2021-12-23 NOTE — Assessment & Plan Note (Signed)
Recheck a1c and adjust regimen as indicated.

## 2021-12-23 NOTE — Assessment & Plan Note (Signed)
Contributing to HTN, CHF, DM2, OSA. Recommend continued lifestyle modifications such as diet and exercise for weight loss.

## 2021-12-23 NOTE — Assessment & Plan Note (Signed)
Compliant with eliquis, continue.

## 2021-12-23 NOTE — Assessment & Plan Note (Signed)
Recheck labs. Tolerant of statin, continue.

## 2021-12-24 ENCOUNTER — Other Ambulatory Visit: Payer: Self-pay | Admitting: Family Medicine

## 2021-12-24 DIAGNOSIS — M5410 Radiculopathy, site unspecified: Secondary | ICD-10-CM

## 2021-12-24 DIAGNOSIS — F419 Anxiety disorder, unspecified: Secondary | ICD-10-CM

## 2021-12-24 LAB — COMPREHENSIVE METABOLIC PANEL
ALT: 13 IU/L (ref 0–44)
AST: 13 IU/L (ref 0–40)
Albumin/Globulin Ratio: 1.6 (ref 1.2–2.2)
Albumin: 3.9 g/dL (ref 3.8–4.8)
Alkaline Phosphatase: 105 IU/L (ref 44–121)
BUN/Creatinine Ratio: 15 (ref 10–24)
BUN: 13 mg/dL (ref 8–27)
Bilirubin Total: 0.7 mg/dL (ref 0.0–1.2)
CO2: 29 mmol/L (ref 20–29)
Calcium: 9.6 mg/dL (ref 8.6–10.2)
Chloride: 99 mmol/L (ref 96–106)
Creatinine, Ser: 0.88 mg/dL (ref 0.76–1.27)
Globulin, Total: 2.5 g/dL (ref 1.5–4.5)
Glucose: 276 mg/dL — ABNORMAL HIGH (ref 70–99)
Potassium: 3.7 mmol/L (ref 3.5–5.2)
Sodium: 145 mmol/L — ABNORMAL HIGH (ref 134–144)
Total Protein: 6.4 g/dL (ref 6.0–8.5)
eGFR: 94 mL/min/{1.73_m2} (ref >59)

## 2021-12-24 LAB — LIPID PANEL
Chol/HDL Ratio: 4 ratio (ref 0.0–5.0)
Cholesterol, Total: 117 mg/dL (ref 100–199)
HDL: 29 mg/dL — ABNORMAL LOW (ref >39)
LDL Chol Calc (NIH): 63 mg/dL (ref 0–99)
Triglycerides: 144 mg/dL (ref 0–149)
VLDL Cholesterol Cal: 25 mg/dL (ref 5–40)

## 2021-12-24 LAB — HEMOGLOBIN A1C
Est. average glucose Bld gHb Est-mCnc: 194 mg/dL
Hgb A1c MFr Bld: 8.4 % — ABNORMAL HIGH (ref 4.8–5.6)

## 2021-12-24 NOTE — Telephone Encounter (Signed)
Requested medication (s) are due for refill today: No  Requested medication (s) are on the active medication list: Yes  Last refill:  11/25/21  Future visit scheduled: Yes Notes to clinic:  See requst.    Requested Prescriptions  Pending Prescriptions Disp Refills   ALPRAZolam (XANAX) 1 MG tablet 90 tablet 3    Sig: Take 1 tablet (1 mg total) by mouth 3 (three) times daily.     Not Delegated - Psychiatry: Anxiolytics/Hypnotics 2 Failed - 12/24/2021 11:51 AM      Failed - This refill cannot be delegated      Failed - Urine Drug Screen completed in last 360 days      Failed - Valid encounter within last 6 months    Recent Outpatient Visits           Yesterday OSA (obstructive sleep apnea)   [facility] Rory Percy M, DO   9 months ago Type 2 diabetes mellitus with diabetic nephropathy, without long-term current use of insulin ([place])   [facility] Birdie Sons, MD   1 year ago Type 2 diabetes mellitus with diabetic nephropathy, without long-term current use of insulin [name])   [facility] Birdie Sons, MD   1 year ago Chest wall pain   [facility] Birdie Sons, MD   1 year ago Type 2 diabetes mellitus with diabetic nephropathy, without long-term current use of insulin [facility])   [place], MD              Passed - Patient is not pregnant       oxyCODONE-acetaminophen (PERCOCET) 10-325 MG tablet 180 tablet 0    Sig: Take 1 tablet by mouth every 4 (four) hours as needed. for pain     Not Delegated - Analgesics:  Opioid Agonist Combinations Failed - 12/24/2021 11:51 AM      Failed - This refill cannot be delegated      Failed - Urine Drug Screen completed in last 360 days      Failed - Valid encounter within last 3 months    Recent Outpatient Visits           Yesterday OSA (obstructive sleep apnea)   [facility] Rory Percy M, DO   9 months ago  Type 2 diabetes mellitus with diabetic nephropathy, without long-term current use of insulin ([place])   [facility] Birdie Sons, MD   1 year ago Type 2 diabetes mellitus with diabetic nephropathy, without long-term current use of insulin [facility])   [facility] Birdie Sons, MD   1 year ago Chest wall pain   [facility] Birdie Sons, MD   1 year ago Type 2 diabetes mellitus with diabetic nephropathy, without long-term current use of insulin [facility])   [facility] [name], [name], MD

## 2021-12-24 NOTE — Telephone Encounter (Signed)
Requested medication (s) are due for refill today:   Yes  Requested medication (s) are on the active medication list:   Yes  Future visit scheduled:   No.  Seen yesterday by Dr. Ky Barban   Last ordered: 11/25/2021 #180, 0 refills  Returned because it's a non delegated refill   Requested Prescriptions  Pending Prescriptions Disp Refills   oxyCODONE-acetaminophen (PERCOCET) 10-325 MG tablet [Pharmacy Med Name: OXYCODONE-APAP 10-325 MG TAB] 180 tablet     Sig: TAKE 1 TABLET BY MOUTH EVERY 4 HOURS AS NEEDED FOR PAIN     Not Delegated - Analgesics:  Opioid Agonist Combinations Failed - 12/24/2021  9:10 AM      Failed - This refill cannot be delegated      Failed - Urine Drug Screen completed in last 360 days      Failed - Valid encounter within last 3 months    Recent Outpatient Visits           Yesterday OSA (obstructive sleep apnea)   [facility] Myles Gip, DO   9 months ago Type 2 diabetes mellitus with diabetic nephropathy, without long-term current use of insulin ([place])   [facility] Birdie Sons, MD   1 year ago Type 2 diabetes mellitus with diabetic nephropathy, without long-term current use of insulin [facility])   [facility] Birdie Sons, MD   1 year ago Chest wall pain   [facility] Birdie Sons, MD   1 year ago Type 2 diabetes mellitus with diabetic nephropathy, without long-term current use of insulin [facility])   [facility] [name], [name], MD

## 2021-12-24 NOTE — Telephone Encounter (Signed)
Copied from [place] [phone]. Topic: Quick Communication - Rx Refill/Question >> Dec 24, 2021 10:01 AM Leward Quan A wrote: Medication: oxyCODONE-acetaminophen (PERCOCET) 10-325 MG tablet, ALPRAZolam Duanne Moron) 1 MG tablet   Has the patient contacted their pharmacy? No. Has to call office  (Agent: If no, request that the patient contact the pharmacy for the refill. If patient does not wish to contact the pharmacy document the reason why and proceed with request.) (Agent: If yes, when and what did the pharmacy advise?) [place], [place]  Phone:  [phone] Fax:  [phone]    Preferred Pharmacy (with phone number or street name):  Has the patient been seen for an appointment in the last year OR does the patient have an upcoming appointment? Yes.    Agent: Please be advised that RX refills may take up to 3 business days. We ask that you follow-up with your pharmacy.

## 2021-12-24 NOTE — Telephone Encounter (Signed)
Duplicate, refill request has been forwarded to Dr. [name] to review for the following drug. KW

## 2021-12-25 ENCOUNTER — Other Ambulatory Visit: Payer: Self-pay | Admitting: Family Medicine

## 2021-12-25 DIAGNOSIS — M5410 Radiculopathy, site unspecified: Secondary | ICD-10-CM

## 2021-12-25 MED ORDER — ALPRAZOLAM 1 MG PO TABS: 1.0000 mg | ORAL_TABLET | Freq: Three times a day (TID) | ORAL | 3 refills | Status: DC

## 2021-12-25 MED ORDER — OXYCODONE-ACETAMINOPHEN 10-325 MG PO TABS: 1.0000 | ORAL_TABLET | ORAL | 0 refills | Status: DC | PRN

## 2021-12-25 NOTE — Telephone Encounter (Signed)
Requested medication (s) are due for refill today: no  Requested medication (s) are on the active medication list: yes  Last refill:  today  Future visit scheduled: yes  Notes to clinic:  medication has already been refilled. Unsure why another refill request was sent.      Requested Prescriptions  Pending Prescriptions Disp Refills   oxyCODONE-acetaminophen (PERCOCET) 10-325 MG tablet [Pharmacy Med Name: [id number] 10-325 MG TAB] 180 tablet     Sig: TAKE 1 TABLET BY MOUTH EVERY 4 HOURS AS NEEDED FOR PAIN     Not Delegated - Analgesics:  Opioid Agonist Combinations Failed - 12/25/2021 10:04 AM      Failed - This refill cannot be delegated      Failed - Urine Drug Screen completed in last 360 days      Failed - Valid encounter within last 3 months    Recent Outpatient Visits           2 days ago OSA (obstructive sleep apnea)   [facility] Myles Gip, DO   9 months ago Type 2 diabetes mellitus with diabetic nephropathy, without long-term current use of insulin ([place])   [facility] Birdie Sons, MD   1 year ago Type 2 diabetes mellitus with diabetic nephropathy, without long-term current use of insulin [facility])   [facility] Birdie Sons, MD   1 year ago Chest wall pain   [facility] Birdie Sons, MD   1 year ago Type 2 diabetes mellitus with diabetic nephropathy, without long-term current use of insulin [facility])   [facility] Birdie Sons, MD       Future Appointments             In 3 months [name], [name], MD [facility], [place]

## 2021-12-27 ENCOUNTER — Other Ambulatory Visit: Payer: Self-pay | Admitting: Family Medicine

## 2022-01-03 DIAGNOSIS — J449 Chronic obstructive pulmonary disease, unspecified: Secondary | ICD-10-CM | POA: Diagnosis not present

## 2022-01-08 ENCOUNTER — Ambulatory Visit (INDEPENDENT_AMBULATORY_CARE_PROVIDER_SITE_OTHER): Payer: Medicare HMO

## 2022-01-08 DIAGNOSIS — Z0000 Encounter for general adult medical examination without abnormal findings: Secondary | ICD-10-CM

## 2022-01-08 NOTE — Progress Notes (Signed)
Virtual Visit via Telephone Note  I connected with  Lawrence [name] on 01/08/22 at  3:40 PM EST by telephone and verified that I am speaking with the correct person using two identifiers.  Location: Patient: home Provider: [facility] Persons participating in the virtual visit: [place]   I discussed the limitations, risks, security and privacy concerns of performing an evaluation and management service by telephone and the availability of in person appointments. The patient expressed understanding and agreed to proceed.  Interactive audio and video telecommunications were attempted between this nurse and patient, however failed, due to patient having technical difficulties OR patient did not have access to video capability.  We continued and completed visit with audio only.  Some vital signs may be absent or patient reported.   Lawrence David, LPN  Subjective:   Lawrence [name] is a 66 y.o. male who presents for Medicare Annual/Subsequent preventive examination.  Review of Systems           Objective:    There were no vitals filed for this visit. There is no height or weight on file to calculate BMI.  Advanced Directives 05/01/2021 04/26/2021 01/03/2021 12/11/2020 12/10/2020 12/10/2020 11/14/2020  Does Patient Have a Medical Advance Directive? _0  No No  Type of Advance Directive - - - - - - -  Does patient want to make changes to medical advance directive? - - - - - - -  Copy of [place] in Chart? - - - - - - -  Would patient like information on creating a medical advance directive? Yes (MAU/Ambulatory/Procedural Areas - Information given) No - Patient declined No - Patient declined No - Patient declined No - Patient declined - -    Current Medications (verified) Outpatient Encounter Medications as of 01/08/2022  Medication Sig   celecoxib (CELEBREX) 200 MG capsule Take 1 capsule by mouth 2 (two) times daily.   isosorbide mononitrate  (IMDUR) 60 MG 24 hr tablet Take by mouth.   linaclotide (LINZESS) 72 MCG capsule Take by mouth.   magnesium oxide (MAG-OX) 400 MG tablet Take 1 tablet by mouth daily.   Accu-Chek Softclix Lancets lancets Use as instructed to check sugar daily for type 2 diabetes.   albuterol (VENTOLIN HFA) 108 (90 Base) MCG/ACT inhaler INHALE 2 PUFFS BY MOUTH EVERY 6 HOURS AS NEEDED FOR SHORTNESS OF BREATH   allopurinol (ZYLOPRIM) 300 MG tablet TAKE 1 TABLET BY MOUTH TWICE A DAY   ALPRAZolam (XANAX) 1 MG tablet Take 1 tablet (1 mg total) by mouth 3 (three) times daily.   amLODipine (NORVASC) 2.5 MG tablet TAKE 1 TABLET BY MOUTH DAILY   aspirin 81 MG chewable tablet Chew 81 mg by mouth daily. Swallows whole   atorvastatin (LIPITOR) 80 MG tablet TAKE ONE TABLET BY MOUTH AT BEDTIME   Blood Glucose Calibration (ACCU-CHEK GUIDE CONTROL) LIQD Use with blood glucose monitor as directed   Blood Glucose Monitoring Suppl (ACCU-CHEK GUIDE) w/Device KIT Use to check blood sugars as directed   celecoxib (CELEBREX) 200 MG capsule Take by mouth.   cetirizine (ZYRTEC) 10 MG tablet TAKE ONE TABLET BY MOUTH AT BEDTIME   docusate sodium (COLACE) 100 MG capsule Take 100 mg by mouth daily as needed for mild constipation.   ELIQUIS 5 MG TABS tablet TAKE 1 TABLET BY MOUTH TWICE A DAY   FARXIGA 10 MG TABS tablet TAKE 1 TABLET BY MOUTH DAILY   fluticasone furoate-vilanterol (BREO ELLIPTA) 100-25  MCG/INH AEPB INHALE 1 PUFF BY MOUTH INTO THE LUNGS DAILY.   glucose blood (ACCU-CHEK GUIDE) test strip Use as instructed to check sugar daily for type 2 diabetes.   isosorbide mononitrate (IMDUR) 120 MG 24 hr tablet TAKE 1 TABLET BY MOUTH DAILY   linaclotide (LINZESS) 72 MCG capsule Take 72 mcg by mouth daily before breakfast.    lisinopril (ZESTRIL) 10 MG tablet TAKE 1 TABLET BY MOUTH DAILY   metFORMIN (GLUCOPHAGE-XR) 500 MG 24 hr tablet TAKE 1 TABLET BY MOUTH TWICE A DAY   methocarbamol (ROBAXIN) 500 MG tablet TAKE 1 TABLET BY MOUTH EVERY 8  HOURS AS NEEDED FOR MUSCLE SPASMS   naloxone (NARCAN) nasal spray 4 mg/0.1 mL 1 spray into nostril x1 and may repeat every 2-3 minutes until patient is responsive or EMS arrives   nitroGLYCERIN (NITROSTAT) 0.4 MG SL tablet PLACE ONE TABLET UNDER THE TONGUE EVERY 5 MINUTES FOR CHEST PAIN. IF NO RELIEF PAST 3RD TAB GO TO EMERGENCY ROOM   omega-3 acid ethyl esters (LOVAZA) 1 g capsule TAKE 4 CAPSULES BY MOUTH DAILY   omeprazole (PRILOSEC) 40 MG capsule Take 40 mg by mouth 2 (two) times daily.   oxyCODONE-acetaminophen (PERCOCET) 10-325 MG tablet Take 1 tablet by mouth every 4 (four) hours as needed. for pain   ranolazine (RANEXA) 1000 MG SR tablet Take 1 tablet (1,000 mg total) by mouth 2 (two) times daily.   sotalol (BETAPACE) 80 MG tablet Take 40 mg by mouth 2 (two) times daily.   tiotropium (SPIRIVA HANDIHALER) 18 MCG inhalation capsule Place 1 capsule (18 mcg total) into inhaler and inhale daily.   torsemide (DEMADEX) 100 MG tablet Take 0.5 tablets (50 mg total) by mouth daily.   traZODone (DESYREL) 150 MG tablet TAKE 1 TABLET BY MOUTH AT BEDTIME   venlafaxine XR (EFFEXOR-XR) 75 MG 24 hr capsule TAKE 1 CAPSULE BY MOUTH DAILY WITH BREAKFAST   No facility-administered encounter medications on file as of 01/08/2022.    Allergies (verified) Demerol  [meperidine hcl], Demerol [meperidine], Jardiance [empagliflozin], Prednisone, Sulfa antibiotics, Albuterol sulfate [albuterol], and Morphine sulfate   History: Past Medical History:  Diagnosis Date   A-fib ([place])    Anemia    Anginal pain (HCC)    Anxiety    Arthritis    Asthma    CAD (coronary artery disease)    a. 2002 CABGx2 (LIMA->LAD, VG->VG->OM1);  b. 09/2012 DES->OM;  c. 03/2015 PTCA of LAD [facility]) in setting of atretic LIMA; d. 05/2015 Cath [facility]): nonobs dzs; e. 06/2015 Cath ([facility]): LM nl, LAD 45p/d ISR, 50d, D1/2 small, LCX 50p/d ISR, OM1 70ost, 30 ISR, VG->OM1 50ost, 46m LIMA->LAD 99p/d - atretic, RCA dom, nl; f.cath 10/16: 40-50%(FFR 0.90)  pLAD, 75% (FFR 0.77) mLAD s/p PCI/DES, oRCA 40% (FFR0.95)   Cancer (HCC)    SKIN CANCER ON BACK   Celiac disease    Chronic diastolic CHF (congestive heart failure) ([place]    a. 06/2009 Echo: EF 60-65%, Gr 1 DD, triv AI, mildly dil LA, nl RV.   COPD (chronic obstructive pulmonary disease) ([place]    a. Chronic bronchitis and emphysema.   DDD (degenerative disc disease), lumbar    Diverticulosis    Dysrhythmia    Essential hypertension    GERD (gastroesophageal reflux disease)    History of hiatal hernia    History of kidney stones    H/O   History of tobacco abuse    a. Quit 2014.   Myocardial infarction ([facility] 2002   4  STENTS   Pancreatitis    PSVT (paroxysmal supraventricular tachycardia) ([place])    a. 10/2012 Noted on Zio Patch.   Sleep apnea    LOST CORD TO CPAP -ONLY 02 @ BEDTIME   Tubular adenoma of colon    Type II diabetes mellitus ([place])    Past Surgical History:  Procedure Laterality Date   BYPASS GRAFT     CARDIAC CATHETERIZATION N/A 07/12/2015   rocedure: Left Heart Cath and Cors/Grafts Angiography;  Surgeon: Belva Crome, MD;  Location: [place] CV LAB;  Service: Cardiovascular;  Laterality: N/A;   CARDIAC CATHETERIZATION Right 10/07/2015   Procedure: Left Heart Cath and Cors/Grafts Angiography;  Surgeon: Lawrence David, MD;  Location: [place] CV LAB;  Service: Cardiovascular;  Laterality: Right;   CARDIAC CATHETERIZATION N/A 04/06/2016   Procedure: Left Heart Cath and Coronary Angiography;  Surgeon: Yolonda Kida, MD;  Location: [place] CV LAB;  Service: Cardiovascular;  Laterality: N/A;   CARDIAC CATHETERIZATION  04/06/2016   Procedure: Bypass Graft Angiography;  Surgeon: Yolonda Kida, MD;  Location: [place] CV LAB;  Service: Cardiovascular;;   CARDIAC CATHETERIZATION N/A 11/02/2016   Procedure: Left Heart Cath and Cors/Grafts Angiography and possible PCI;  Surgeon: Yolonda Kida, MD;  Location: [place] CV LAB;  Service:  Cardiovascular;  Laterality: N/A;   CARDIAC CATHETERIZATION N/A 11/02/2016   Procedure: Coronary Stent Intervention;  Surgeon: Yolonda Kida, MD;  Location: [place] CV LAB;  Service: Cardiovascular;  Laterality: N/A;   CHOLECYSTECTOMY     CIRCUMCISION N/A 06/09/2019   Procedure: CIRCUMCISION ADULT;  Surgeon: Billey Co, MD;  Location: [facility] ORS;  Service: Urology;  Laterality: N/A;   COLONOSCOPY WITH PROPOFOL N/A 04/01/2018   Procedure: COLONOSCOPY WITH PROPOFOL;  Surgeon: Manya Silvas, MD;  Location: [facility] ENDOSCOPY;  Service: Endoscopy;  Laterality: N/A;   ESOPHAGEAL DILATION     ESOPHAGOGASTRODUODENOSCOPY (EGD) WITH PROPOFOL N/A 04/01/2018   Procedure: ESOPHAGOGASTRODUODENOSCOPY (EGD) WITH PROPOFOL;  Surgeon: Manya Silvas, MD;  Location: [facility] ENDOSCOPY;  Service: Endoscopy;  Laterality: N/A;   LEFT HEART CATH AND CORS/GRAFTS ANGIOGRAPHY N/A 06/12/2019   Procedure: LEFT HEART CATH AND CORS/GRAFTS ANGIOGRAPHY;  Surgeon: Teodoro Spray, MD;  Location: [place] CV LAB;  Service: Cardiovascular;  Laterality: N/A;   LEFT HEART CATH AND CORS/GRAFTS ANGIOGRAPHY N/A 03/11/2020   Procedure: LEFT HEART CATH AND CORS/GRAFTS ANGIOGRAPHY;  Surgeon: Isaias Cowman, MD;  Location: [place] [facility] LAB;  Service: Cardiovascular;  Laterality: N/A;   LEFT HEART CATH AND CORS/GRAFTS ANGIOGRAPHY N/A 05/01/2021   Procedure: LEFT HEART CATH AND CORS/GRAFTS ANGIOGRAPHY;  Surgeon: Corey Skains, MD;  Location: [place] CV LAB;  Service: Cardiovascular;  Laterality: N/A;   TONSILLECTOMY     VASCULAR SURGERY     Family History  Problem Relation Age of Onset   Heart attack Mother    Depression Mother    Heart disease Mother    COPD Mother    Hypertension Mother    Heart attack Father    Diabetes Father    Depression Father    Heart disease Father    Cirrhosis Father    Parkinson's disease Brother    Social History   Socioeconomic History   Marital status: Widowed     Spouse name: Not on file   Number of children: 1   Years of education: Not on file   Highest education level: 10th grade  Occupational History   Occupation: Disabled   Occupation:  on social security  Tobacco Use   Smoking status: Former    Packs/day: 3.00    Years: 50.00    Pack years: 150.00    Types: Cigarettes    Quit date: 04/22/2013    Years since quitting: 8.7   Smokeless tobacco: Never   Tobacco comments:    Reports not smoking for approx 8 years.  Vaping Use   Vaping Use: Never used  Substance and Sexual Activity   Alcohol use: No    Comment: remotely quit alcohol use. Hx of heavy alcohol use.   Drug use: Yes    Types: Marijuana    Comment: occasionally   Sexual activity: Not on file  Other Topics Concern   Not on file  Social History Narrative   Pt lives in [place] with wife.  Does not routinely exercise.   Social Determinants of Health   Financial Resource Strain: Not on file  Food Insecurity: No Food Insecurity   Worried About Charity fundraiser in the Last Year: Never true   Ran Out of Food in the Last Year: Never true  Transportation Needs: No Transportation Needs   Lack of Transportation (Medical): No   Lack of Transportation (Non-Medical): No  Physical Activity: Not on file  Stress: Not on file  Social Connections: Not on file    Tobacco Counseling Counseling given: Not Answered Tobacco comments: Reports not smoking for approx 8 years.   Clinical Intake:  Pre-visit preparation completed: Yes  Pain : No/denies pain     Nutritional Risks: None Diabetes: Yes CBG done?: No Did pt. bring in CBG monitor from home?: No  How often do you need to have someone help you when you read instructions, pamphlets, or other written materials from your doctor or pharmacy?: 1 - Never  Diabetic?yes Nutrition Risk Assessment:  Has the patient had any N/V/D within the last 2 months?  No  Does the patient have any non-healing wounds?  No  Has the  patient had any unintentional weight loss or weight gain?  No   Diabetes:  Is the patient diabetic?  Yes  If diabetic, was a CBG obtained today?  No  Did the patient bring in their glucometer from home?  No  How often do you monitor your CBG's? In am and pm   Financial Strains and Diabetes Management:  Are you having any financial strains with the device, your supplies or your medication? No .  Does the patient want to be seen by Chronic Care Management for management of their diabetes?  No  Would the patient like to be referred to a Nutritionist or for Diabetic Management?  No   Diabetic Exams:  Diabetic Eye Exam: Completed 02/23/18. Overdue for diabetic eye exam. Pt has been advised about the importance in completing this exam.   Diabetic Foot Exam: Completed 08/31/17. Pt has been advised about the importance in completing this exam.   Interpreter Needed?: No  Information entered by :: Kirke Shaggy, LPN   Activities of Daily Living In your present state of health, do you have any difficulty performing the following activities: 04/27/2021 04/27/2021  Hearing? - N  Vision? - N  Difficulty concentrating or making decisions? - N  Walking or climbing stairs? - Y  Dressing or bathing? - N  Doing errands, shopping? N -  Some recent data might be hidden    Patient Care Team: Birdie Sons, MD as PCP - General (Family Medicine) Allyne Gee, MD as Consulting  Physician (Pulmonary Disease) Marisa Hua, PA-C as Physician Assistant Anell Barr, [place] as Consulting Physician (Optometry) Ubaldo Glassing Javier Docker, MD as Consulting Physician (Cardiology) Lloyd Huger, MD as Consulting Physician (Oncology) Neldon Labella, RN as Case Manager  Indicate any recent Medical Services you may have received from other than [facility] providers in the past year (date may be approximate).     Assessment:   This is a routine wellness examination for [place].  Hearing/Vision screen No  results found.  Dietary issues and exercise activities discussed:     Goals Addressed   None    Depression Screen PHQ 2/9 Scores 03/10/2021 01/15/2021 09/24/2020 05/09/2020 03/29/2020 03/21/2019 08/17/2018  PHQ - 2 Score 1 0 0 0 1 2 0  PHQ- 9 Score 3 - 0 - - 9 -    Fall Risk Fall Risk  01/15/2021 05/09/2020 01/02/2020 08/17/2018 05/17/2018  Falls in the past year? 1 1 0 No Yes  Comment - Fallen over coffe table - - -  Number falls in past yr: 1 1 0 - 1  Comment - - - - -  Injury with Fall? 0 0 0 - No  Risk for fall due to : History of fall(s);Medication side effect - - - History of fall(s)  Follow up Falls prevention discussed Falls prevention discussed - - -    FALL RISK PREVENTION PERTAINING TO THE HOME:  Any stairs in or around the home? No  If so, are there any without handrails? No  Home free of loose throw rugs in walkways, pet beds, electrical cords, etc? Yes  Adequate lighting in your home to reduce risk of falls? Yes   ASSISTIVE DEVICES UTILIZED TO PREVENT FALLS:  Life alert? No  Use of a cane, walker or w/c? Yes  Grab bars in the bathroom? Yes  Shower chair or bench in shower? Yes  Elevated toilet seat or a handicapped toilet? No    Cognitive Function:    6CIT Screen 10/27/2016  What Year? 0 points  What month? 0 points  What time? 0 points  Count back from 20 0 points  Months in reverse 4 points  Repeat phrase 4 points  Total Score 8    Immunizations Immunization History  Administered Date(s) Administered   Fluad Quad(high Dose 65+) 09/24/2020   Influenza Inj Mdck Quad Pf 08/25/2019   Influenza,inj,Quad PF,6+ Mos 08/02/2015, 07/17/2016, 08/13/2017, 07/27/2018   Influenza-Unspecified 07/27/2018   PFIZER(Purple Top)SARS-COV-2 Vaccination 03/13/2020, 04/03/2020, 10/05/2020   Pneumococcal Conjugate-13 05/13/2020   Pneumococcal Polysaccharide-23 05/06/2004, 08/22/2015   Td 10/28/2009, 08/31/2017   Tdap 05/13/2011   Zoster Recombinat (Shingrix) 08/13/2017     TDAP status: Up to date  Flu Vaccine status: Declined, Education has been provided regarding the importance of this vaccine but patient still declined. Advised may receive this vaccine at local pharmacy or Health Dept. Aware to provide a copy of the vaccination record if obtained from local pharmacy or Health Dept. Verbalized acceptance and understanding.  Pneumococcal vaccine status: Up to date  Covid-19 vaccine status: Completed vaccines  Qualifies for Shingles Vaccine? Yes   Zostavax completed No   Shingrix Completed?: Yes  Screening Tests Health Maintenance  Topic Date Due   Zoster Vaccines- Shingrix (2 of 2) 10/08/2017   FOOT EXAM  08/31/2018   OPHTHALMOLOGY EXAM  02/24/2019   COVID-19 Vaccine (4 - Booster for Pfizer series) 11/30/2020   Pneumonia Vaccine 83+ Years old (3 - PPSV23 if available, else PCV20) 05/13/2021   INFLUENZA VACCINE  06/16/2021   HEMOGLOBIN A1C  06/22/2022   COLONOSCOPY (Pts 45-3yr Insurance coverage will need to be confirmed)  04/02/2023   TETANUS/TDAP  09/01/2027   Hepatitis C Screening  Completed   HPV VACCINES  Aged Out    Health Maintenance  Health Maintenance Due  Topic Date Due   Zoster Vaccines- Shingrix (2 of 2) 10/08/2017   FOOT EXAM  08/31/2018   OPHTHALMOLOGY EXAM  02/24/2019   COVID-19 Vaccine (4 - Booster for Pfizer series) 11/30/2020   Pneumonia Vaccine 68 Years old (3 - PPSV23 if available, else PCV20) 05/13/2021   INFLUENZA VACCINE  06/16/2021    Colorectal cancer screening: Type of screening: Colonoscopy. Completed 04/01/18. Repeat every 5 years  Lung Cancer Screening: (Low Dose CT Chest recommended if Age 68-80years, 30 pack-year currently smoking OR have quit w/in 15years.) does not qualify.    Additional Screening:  Hepatitis C Screening: does qualify; Completed 10/27/16  Vision Screening: Recommended annual ophthalmology exams for early detection of glaucoma and other disorders of the eye. Is the patient up to  date with their annual eye exam?  Yes  Who is the provider or what is the name of the office in which the patient attends annual eye exams? Dr.Gwaltney If pt is not established with a provider, would they like to be referred to a provider to establish care? No .   Dental Screening: Recommended annual dental exams for proper oral hygiene  Community Resource Referral / Chronic Care Management: CRR required this visit?  No   CCM required this visit?  No      Plan:     I have personally reviewed and noted the following in the patients chart:   Medical and social history Use of alcohol, tobacco or illicit drugs  Current medications and supplements including opioid prescriptions. Patient is currently taking opioid prescriptions. Information provided to patient regarding non-opioid alternatives. Patient advised to discuss non-opioid treatment plan with their provider. Functional ability and status Nutritional status Physical activity Advanced directives List of other physicians Hospitalizations, surgeries, and ER visits in previous 12 months Vitals Screenings to include cognitive, depression, and falls Referrals and appointments  In addition, I have reviewed and discussed with patient certain preventive protocols, quality metrics, and best practice recommendations. A written personalized care plan for preventive services as well as general preventive health recommendations were provided to patient.     LDionisio David LPN   [id number]  Nurse Notes: none

## 2022-01-08 NOTE — Patient Instructions (Signed)
Lawrence [name] , Thank you for taking time to come for your Medicare Wellness Visit. I appreciate your ongoing commitment to your health goals. Please review the following plan we discussed and let me know if I can assist you in the future.   Screening recommendations/referrals: Colonoscopy: 04/01/18, due next year Recommended yearly ophthalmology/optometry visit for glaucoma screening and checkup Recommended yearly dental visit for hygiene and checkup  Vaccinations: Influenza vaccine: n/d Pneumococcal vaccine: 05/13/20 Tdap vaccine: 08/31/17 Shingles vaccine: Shingrix 08/13/17 needs 2nd shot   Covid-19: 03/13/20, 04/03/20, 10/05/20  Advanced directives: no  Conditions/risks identified: none  Next appointment: Follow up in one year for your annual wellness visit. 01/11/23@ 3:40pm by phone  Preventive Care 68 Years and Older, Male Preventive care refers to lifestyle choices and visits with your health care provider that can promote health and wellness. What does preventive care include? A yearly physical exam. This is also called an annual well check. Dental exams once or twice a year. Routine eye exams. Ask your health care provider how often you should have your eyes checked. Personal lifestyle choices, including: Daily care of your teeth and gums. Regular physical activity. Eating a healthy diet. Avoiding tobacco and drug use. Limiting alcohol use. Practicing safe sex. Taking low doses of aspirin every day. Taking vitamin and mineral supplements as recommended by your health care provider. What happens during an annual well check? The services and screenings done by your health care provider during your annual well check will depend on your age, overall health, lifestyle risk factors, and family history of disease. Counseling  Your health care provider may ask you questions about your: Alcohol use. Tobacco use. Drug use. Emotional well-being. Home and relationship  well-being. Sexual activity. Eating habits. History of falls. Memory and ability to understand (cognition). Work and work Statistician. Screening  You may have the following tests or measurements: Height, weight, and BMI. Blood pressure. Lipid and cholesterol levels. These may be checked every 5 years, or more frequently if you are over 68 years old. Skin check. Lung cancer screening. You may have this screening every year starting at age 68 if you have a 30-pack-year history of smoking and currently smoke or have quit within the past 15 years. Fecal occult blood test (FOBT) of the stool. You may have this test every year starting at age 68. Flexible sigmoidoscopy or colonoscopy. You may have a sigmoidoscopy every 5 years or a colonoscopy every 10 years starting at age 68. Prostate cancer screening. Recommendations will vary depending on your family history and other risks. Hepatitis C blood test. Hepatitis B blood test. Sexually transmitted disease (STD) testing. Diabetes screening. This is done by checking your blood sugar (glucose) after you have not eaten for a while (fasting). You may have this done every 1-3 years. Abdominal aortic aneurysm (AAA) screening. You may need this if you are a current or former smoker. Osteoporosis. You may be screened starting at age 68 if you are at high risk. Talk with your health care provider about your test results, treatment options, and if necessary, the need for more tests. Vaccines  Your health care provider may recommend certain vaccines, such as: Influenza vaccine. This is recommended every year. Tetanus, diphtheria, and acellular pertussis (Tdap, Td) vaccine. You may need a Td booster every 10 years. Zoster vaccine. You may need this after age 60. Pneumococcal 13-valent conjugate (PCV13) vaccine. One dose is recommended after age 68. Pneumococcal polysaccharide (PPSV23) vaccine. One dose is recommended after age 68.  Talk to your health care  provider about which screenings and vaccines you need and how often you need them. This information is not intended to replace advice given to you by your health care provider. Make sure you discuss any questions you have with your health care provider. Document Released: 11/29/2015 Document Revised: 07/22/2016 Document Reviewed: 09/03/2015 Elsevier Interactive Patient Education  2017 [place] Prevention in the Home Falls can cause injuries. They can happen to people of all ages. There are many things you can do to make your home safe and to help prevent falls. What can I do on the outside of my home? Regularly fix the edges of walkways and driveways and fix any cracks. Remove anything that might make you trip as you walk through a door, such as a raised step or threshold. Trim any bushes or trees on the path to your home. Use bright outdoor lighting. Clear any walking paths of anything that might make someone trip, such as rocks or tools. Regularly check to see if handrails are loose or broken. Make sure that both sides of any steps have handrails. Any raised decks and porches should have guardrails on the edges. Have any leaves, snow, or ice cleared regularly. Use sand or salt on walking paths during winter. Clean up any spills in your garage right away. This includes oil or grease spills. What can I do in the bathroom? Use night lights. Install grab bars by the toilet and in the tub and shower. Do not use towel bars as grab bars. Use non-skid mats or decals in the tub or shower. If you need to sit down in the shower, use a plastic, non-slip stool. Keep the floor dry. Clean up any water that spills on the floor as soon as it happens. Remove soap buildup in the tub or shower regularly. Attach bath mats securely with double-sided non-slip rug tape. Do not have throw rugs and other things on the floor that can make you trip. What can I do in the bedroom? Use night lights. Make  sure that you have a light by your bed that is easy to reach. Do not use any sheets or blankets that are too big for your bed. They should not hang down onto the floor. Have a firm chair that has side arms. You can use this for support while you get dressed. Do not have throw rugs and other things on the floor that can make you trip. What can I do in the kitchen? Clean up any spills right away. Avoid walking on wet floors. Keep items that you use a lot in easy-to-reach places. If you need to reach something above you, use a strong step stool that has a grab bar. Keep electrical cords out of the way. Do not use floor polish or wax that makes floors slippery. If you must use wax, use non-skid floor wax. Do not have throw rugs and other things on the floor that can make you trip. What can I do with my stairs? Do not leave any items on the stairs. Make sure that there are handrails on both sides of the stairs and use them. Fix handrails that are broken or loose. Make sure that handrails are as long as the stairways. Check any carpeting to make sure that it is firmly attached to the stairs. Fix any carpet that is loose or worn. Avoid having throw rugs at the top or bottom of the stairs. If you do have throw  rugs, attach them to the floor with carpet tape. Make sure that you have a light switch at the top of the stairs and the bottom of the stairs. If you do not have them, ask someone to add them for you. What else can I do to help prevent falls? Wear shoes that: Do not have high heels. Have rubber bottoms. Are comfortable and fit you well. Are closed at the toe. Do not wear sandals. If you use a stepladder: Make sure that it is fully opened. Do not climb a closed stepladder. Make sure that both sides of the stepladder are locked into place. Ask someone to hold it for you, if possible. Clearly mark and make sure that you can see: Any grab bars or handrails. First and last steps. Where the  edge of each step is. Use tools that help you move around (mobility aids) if they are needed. These include: Canes. Walkers. Scooters. Crutches. Turn on the lights when you go into a dark area. Replace any light bulbs as soon as they burn out. Set up your furniture so you have a clear path. Avoid moving your furniture around. If any of your floors are uneven, fix them. If there are any pets around you, be aware of where they are. Review your medicines with your doctor. Some medicines can make you feel dizzy. This can increase your chance of falling. Ask your doctor what other things that you can do to help prevent falls. This information is not intended to replace advice given to you by your health care provider. Make sure you discuss any questions you have with your health care provider. Document Released: 08/29/2009 Document Revised: 04/09/2016 Document Reviewed: 12/07/2014 Elsevier Interactive Patient Education  2017 [employer].

## 2022-01-16 ENCOUNTER — Ambulatory Visit (INDEPENDENT_AMBULATORY_CARE_PROVIDER_SITE_OTHER): Payer: Medicare HMO

## 2022-01-16 DIAGNOSIS — J449 Chronic obstructive pulmonary disease, unspecified: Secondary | ICD-10-CM

## 2022-01-16 DIAGNOSIS — E1121 Type 2 diabetes mellitus with diabetic nephropathy: Secondary | ICD-10-CM

## 2022-01-16 DIAGNOSIS — I509 Heart failure, unspecified: Secondary | ICD-10-CM

## 2022-01-20 ENCOUNTER — Telehealth: Payer: Self-pay

## 2022-01-20 DIAGNOSIS — M5410 Radiculopathy, site unspecified: Secondary | ICD-10-CM

## 2022-01-20 NOTE — Telephone Encounter (Signed)
Copied from [place] [id number]. Topic: General - Other ?>> Jan 20, 2022  9:38 AM Valere Dross wrote: ?Reason for CRM: Pt called in stating when he talked to the pharmacy they told him that medication oxyCODONE-acetaminophen (PERCOCET) 10-325 MG tablet was on a nation wide back order, and wanted to know what he needs to do about his medication, please advise. ?

## 2022-01-20 NOTE — Telephone Encounter (Signed)
Patient advised.

## 2022-01-20 NOTE — Telephone Encounter (Signed)
He needs to call around and find a pharmacy that has it in stock. It changes from one day to the next.  ?

## 2022-01-21 ENCOUNTER — Telehealth: Payer: Self-pay

## 2022-01-21 MED ORDER — OXYCODONE-ACETAMINOPHEN 10-325 MG PO TABS: 1.0000 | ORAL_TABLET | ORAL | 0 refills | Status: DC | PRN

## 2022-01-21 NOTE — Addendum Note (Signed)
Addended by: Birdie Sons on: 01/21/2022 10:48 AM ? ? Modules accepted: Orders ? ?

## 2022-01-21 NOTE — Telephone Encounter (Signed)
1- Patient is waiting at my desk for a written RX for his Oxycodone 10-325 to take to [facility] in [place].  His regular pharmacy doesn't have any.   2- He said he was seen by a PA or assistant here and was given a Zpak.   He is stating he needs a refill on that as well.

## 2022-01-21 NOTE — Telephone Encounter (Signed)
Pt called in stating he has found a pharmacy and would like his medication sent to:  ? ?[facility] [place], [place] AT [facility] [place] Phone:  [phone]  ?Fax:  [phone]  ?  ? ?

## 2022-01-31 DIAGNOSIS — J449 Chronic obstructive pulmonary disease, unspecified: Secondary | ICD-10-CM | POA: Diagnosis not present

## 2022-02-09 ENCOUNTER — Other Ambulatory Visit: Payer: Self-pay | Admitting: Family Medicine

## 2022-02-09 DIAGNOSIS — E1121 Type 2 diabetes mellitus with diabetic nephropathy: Secondary | ICD-10-CM

## 2022-02-11 ENCOUNTER — Encounter: Payer: Self-pay | Admitting: Internal Medicine

## 2022-02-13 DIAGNOSIS — E1121 Type 2 diabetes mellitus with diabetic nephropathy: Secondary | ICD-10-CM | POA: Diagnosis not present

## 2022-02-13 DIAGNOSIS — J449 Chronic obstructive pulmonary disease, unspecified: Secondary | ICD-10-CM

## 2022-02-13 DIAGNOSIS — I509 Heart failure, unspecified: Secondary | ICD-10-CM

## 2022-02-19 ENCOUNTER — Other Ambulatory Visit: Payer: Self-pay | Admitting: Family Medicine

## 2022-02-19 DIAGNOSIS — M5410 Radiculopathy, site unspecified: Secondary | ICD-10-CM

## 2022-02-23 NOTE — Chronic Care Management (AMB) (Addendum)
?Chronic Care Management  ? ?CCM RN Visit Note ? ? ?Name: Lawrence [name] MRN: [id number] DOB: 1955-11-30 ? ?Subjective: ?Lawrence [name] is a 66 y.o. year old male who is a primary care patient of [name], [name], MD. The care management team was consulted for assistance with disease management and care coordination needs.   ? ?Engaged with patient by telephone for follow up visit in response to provider referral for case management and care coordination services.  ? ?Consent to Services:  ?The patient was given information about Chronic Care Management services, agreed to services, and gave verbal consent prior to initiation of services.  Please see initial visit note for detailed documentation.  ? ?Assessment: Review of patient past medical history, allergies, medications, health status, including review of consultants reports, laboratory and other test data, was performed as part of comprehensive evaluation and provision of chronic care management services.  ? ?SDOH (Social Determinants of Health) assessments and interventions performed: No ? ?CCM Care Plan ? ?Allergies  ?Allergen Reactions  ? Demerol  [Meperidine Hcl]   ? Demerol [Meperidine] Hives  ? Jardiance [Empagliflozin] Other (See Comments)  ?  Perineal pain  ? Prednisone Other (See Comments) and Hypertension  ?  Pt states that this medication puts him in A-fib   ? Sulfa Antibiotics Hives  ? Albuterol Sulfate [Albuterol] Palpitations and Other (See Comments)  ?  Pt currently uses this medication.    ? Morphine Sulfate Nausea And Vomiting, Rash and Other (See Comments)  ?  Pt states that he is only allergic to the tablet form of this medication.    ? ? ?Outpatient Encounter Medications as of 01/16/2022  ?Medication Sig Note  ? Accu-Chek Softclix Lancets lancets Use as instructed to check sugar daily for type 2 diabetes.   ? albuterol (VENTOLIN HFA) 108 (90 Base) MCG/ACT inhaler INHALE 2 PUFFS BY MOUTH EVERY 6 HOURS AS NEEDED FOR SHORTNESS OF BREATH   ?  allopurinol (ZYLOPRIM) 300 MG tablet TAKE 1 TABLET BY MOUTH TWICE A DAY   ? ALPRAZolam (XANAX) 1 MG tablet Take 1 tablet (1 mg total) by mouth 3 (three) times daily.   ? amLODipine (NORVASC) 2.5 MG tablet TAKE 1 TABLET BY MOUTH DAILY   ? aspirin 81 MG chewable tablet Chew 81 mg by mouth daily. Swallows whole   ? atorvastatin (LIPITOR) 80 MG tablet TAKE ONE TABLET BY MOUTH AT BEDTIME   ? Blood Glucose Calibration (ACCU-CHEK GUIDE CONTROL) LIQD Use with blood glucose monitor as directed   ? Blood Glucose Monitoring Suppl (ACCU-CHEK GUIDE) w/Device KIT Use to check blood sugars as directed   ? celecoxib (CELEBREX) 200 MG capsule Take 1 capsule by mouth 2 (two) times daily.   ? cetirizine (ZYRTEC) 10 MG tablet TAKE ONE TABLET BY MOUTH AT BEDTIME   ? docusate sodium (COLACE) 100 MG capsule Take 100 mg by mouth daily as needed for mild constipation. (Patient not taking: Reported on 01/08/2022)   ? ELIQUIS 5 MG TABS tablet TAKE 1 TABLET BY MOUTH TWICE A DAY   ? fluticasone furoate-vilanterol (BREO ELLIPTA) 100-25 MCG/INH AEPB INHALE 1 PUFF BY MOUTH INTO THE LUNGS DAILY.   ? glucose blood (ACCU-CHEK GUIDE) test strip Use as instructed to check sugar daily for type 2 diabetes.   ? isosorbide mononitrate (IMDUR) 120 MG 24 hr tablet TAKE 1 TABLET BY MOUTH DAILY   ? isosorbide mononitrate (IMDUR) 60 MG 24 hr tablet Take by mouth.   ? linaclotide (LINZESS) 72 MCG  capsule Take 72 mcg by mouth daily before breakfast.    ? lisinopril (ZESTRIL) 10 MG tablet TAKE 1 TABLET BY MOUTH DAILY   ? magnesium oxide (MAG-OX) 400 MG tablet Take 1 tablet by mouth daily.   ? metFORMIN (GLUCOPHAGE-XR) 500 MG 24 hr tablet TAKE 1 TABLET BY MOUTH TWICE A DAY   ? methocarbamol (ROBAXIN) 500 MG tablet TAKE 1 TABLET BY MOUTH EVERY 8 HOURS AS NEEDED FOR MUSCLE SPASMS   ? naloxone (NARCAN) nasal spray 4 mg/0.1 mL 1 spray into nostril x1 and may repeat every 2-3 minutes until patient is responsive or EMS arrives   ? nitroGLYCERIN (NITROSTAT) 0.4 MG SL  tablet PLACE ONE TABLET UNDER THE TONGUE EVERY 5 MINUTES FOR CHEST PAIN. IF NO RELIEF PAST 3RD TAB GO TO EMERGENCY ROOM   ? omega-3 acid ethyl esters (LOVAZA) 1 g capsule TAKE 4 CAPSULES BY MOUTH DAILY   ? omeprazole (PRILOSEC) 40 MG capsule Take 40 mg by mouth 2 (two) times daily.   ? ranolazine (RANEXA) 1000 MG SR tablet Take 1 tablet (1,000 mg total) by mouth 2 (two) times daily.   ? sotalol (BETAPACE) 80 MG tablet Take 40 mg by mouth 2 (two) times daily.   ? tiotropium (SPIRIVA HANDIHALER) 18 MCG inhalation capsule Place 1 capsule (18 mcg total) into inhaler and inhale daily.   ? torsemide (DEMADEX) 100 MG tablet Take 0.5 tablets (50 mg total) by mouth daily. 01/15/2021: Reports taking as needed.  ? traZODone (DESYREL) 150 MG tablet TAKE 1 TABLET BY MOUTH AT BEDTIME   ? venlafaxine XR (EFFEXOR-XR) 75 MG 24 hr capsule TAKE 1 CAPSULE BY MOUTH DAILY WITH BREAKFAST   ? [DISCONTINUED] FARXIGA 10 MG TABS tablet TAKE 1 TABLET BY MOUTH DAILY   ? [DISCONTINUED] oxyCODONE-acetaminophen (PERCOCET) 10-325 MG tablet Take 1 tablet by mouth every 4 (four) hours as needed. for pain   ? ?No facility-administered encounter medications on file as of 01/16/2022.  ? ? ?Patient Active Problem List  ? Diagnosis Date Noted  ? Chronic diastolic heart failure ([place])   ? Hypertensive urgency 04/27/2021  ? Atherosclerosis of aorta ([place]) 12/16/2020  ? Syncope 12/11/2020  ? Acidosis 12/11/2020  ? Polycythemia 10/05/2020  ? NSTEMI (non-ST elevated myocardial infarction) ([place]) 09/26/2020  ? Primary insomnia 05/13/2020  ? Recurrent major depressive disorder, remission status unspecified ([place]) 03/29/2020  ? Elevated troponin 03/07/2020  ? Bereavement 09/09/2019  ? Chronic respiratory failure with hypoxia ([place]) 05/29/2019  ? Morbid obesity ([place]) 05/29/2019  ? Trigger thumb of right hand 11/28/2018  ? Eye pain, left 08/18/2018  ? Acute on chronic heart failure ([place]) 04/18/2018  ? History of adenomatous polyp of colon 04/05/2018  ? Abdominal pain,  chronic, epigastric 11/06/2017  ? Bilateral flank pain 03/24/2017  ? Dyspnea 04/03/2016  ? Hypotension 04/03/2016  ? CKD (chronic kidney disease) stage 2, GFR 60-89 ml/min 04/03/2016  ? Anemia 04/03/2016  ? Atrial fibrillation ([place]) 12/23/2015  ? OSA (obstructive sleep apnea) 12/10/2015  ? Left inguinal hernia 11/07/2015  ? Anxiety 11/07/2015  ? Unstable angina ([place]) 10/05/2015  ? Back pain with left-sided radiculopathy 09/30/2015  ? Nocturnal hypoxia 09/06/2015  ? BPH (benign prostatic hyperplasia) 08/01/2015  ? Chronic systolic CHF (congestive heart failure) ([place])   ? Angina pectoris ([place])   ? Chest pain 07/11/2015  ? COPD (chronic obstructive pulmonary disease) ([place]) 07/03/2015  ? CAD (coronary artery disease) 06/26/2015  ? Uncontrolled hypertension 06/26/2015  ? 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  Achalasia 07/24/2014  ? GERD (gastroesophageal reflux disease) 06/07/2014  ? Former tobacco use 04/11/2013  ? HLD (hyperlipidemia) 04/09/2013  ? ? ?Patient Care Plan: RN Care Management Plan of Care  ?  ? ?Problem Identified: A-Fib, CHF, COPD, HLD, HTN   ?  ? ?Long-Range Goal: Disease Progression Prevented or Minimized   ?Start Date: 01/16/2022  ?Expected End Date: 04/16/2022  ?Priority: High  ?Note:   ?Current Barriers:  ?Chronic Disease Management support and education needs related to Atrial Fibrillation, CHF, HTN, DM and COPD  ? ?RNCM Clinical Goal(s):  ?Patient will demonstrate ongoing adherence to prescribed treatment plan for Atrial Fibrillation, CHF, HTN, COPD, and DM through collaboration with the provider, RN Care manager, and the care management team. ? ?Interventions: ?1:1 collaboration with primary care provider regarding development and update of comprehensive plan of care as evidenced by provider attestation and co-signature ?Inter-disciplinary care team collaboration (see longitudinal plan of care) ?Evaluation of current treatment plan related to  self management and patient's  adherence to plan as established by provider ? ? ?AFIB Interventions: (Status:  Condition stable.  Not addressed this visit.) Long Term Goal ?Discussed increased risk of stroke due to Afib and benefits of anticoagula

## 2022-03-02 ENCOUNTER — Other Ambulatory Visit: Payer: Self-pay | Admitting: Family Medicine

## 2022-03-02 DIAGNOSIS — I10 Essential (primary) hypertension: Secondary | ICD-10-CM

## 2022-03-02 DIAGNOSIS — R0789 Other chest pain: Secondary | ICD-10-CM

## 2022-03-03 DIAGNOSIS — J449 Chronic obstructive pulmonary disease, unspecified: Secondary | ICD-10-CM | POA: Diagnosis not present

## 2022-03-09 ENCOUNTER — Other Ambulatory Visit: Payer: Self-pay | Admitting: Family Medicine

## 2022-03-09 DIAGNOSIS — K219 Gastro-esophageal reflux disease without esophagitis: Secondary | ICD-10-CM

## 2022-03-19 ENCOUNTER — Other Ambulatory Visit: Payer: Self-pay | Admitting: Family Medicine

## 2022-03-19 DIAGNOSIS — M5410 Radiculopathy, site unspecified: Secondary | ICD-10-CM

## 2022-03-20 ENCOUNTER — Other Ambulatory Visit: Payer: Self-pay | Admitting: Family Medicine

## 2022-03-20 DIAGNOSIS — M5410 Radiculopathy, site unspecified: Secondary | ICD-10-CM

## 2022-03-20 NOTE — Telephone Encounter (Signed)
Requested medication (s) are due for refill today: no ? ?Requested medication (s) are on the active medication list: yes ? ?Last refill:  yesterday #180/0 ? ?Future visit scheduled: yes ? ?Notes to clinic:  Unable to refill per protocol, cannot delegate. ?E-Prescribing Status: Receipt confirmed by pharmacy (03/19/2022  5:39 PM EDT) ? ? ?  ?Requested Prescriptions  ?Pending Prescriptions Disp Refills  ? oxyCODONE-acetaminophen (PERCOCET) 10-325 MG tablet [Pharmacy Med Name: OXYCODONE-APAP 10-325 MG TAB] 180 tablet   ?  Sig: TAKE 1 TABLET BY MOUTH EVERY 4 HOURS AS NEEDED FOR PAIN  ?  ? Not Delegated - Analgesics:  Opioid Agonist Combinations Failed - 03/20/2022  9:59 AM  ?  ?  Failed - This refill cannot be delegated  ?  ?  Failed - Urine Drug Screen completed in last 360 days  ?  ?  Passed - Valid encounter within last 3 months  ?  Recent Outpatient Visits   ? ?      ? 2 months ago OSA (obstructive sleep apnea)  ? Britton, [place], DO  ? 1 year ago Type 2 diabetes mellitus with diabetic nephropathy, without long-term current use of insulin ([place])  ? [facility] Birdie Sons, MD  ? 1 year ago Type 2 diabetes mellitus with diabetic nephropathy, without long-term current use of insulin ([place])  ? [facility] Birdie Sons, MD  ? 1 year ago Chest wall pain  ? [facility] Birdie Sons, MD  ? 1 year ago Type 2 diabetes mellitus with diabetic nephropathy, without long-term current use of insulin ([place])  ? [facility] [name], [name], MD  ? ?  ?  ?Future Appointments   ? ?        ? In 1 week [name], [name], MD [name]  ? ?  ? ? ?  ?  ?  ? ?

## 2022-03-30 ENCOUNTER — Ambulatory Visit: Payer: Medicare HMO | Admitting: Family Medicine

## 2022-04-01 NOTE — Progress Notes (Addendum)
I,Laura E Walsh,acting as a scribe for [name], MD.,have documented all relevant documentation on the behalf of [name], MD,as directed by  [name], MD while in the presence of [name], MD.  Established patient visit   Patient: Lawrence [name]   DOB: Mar 24, 1956   66 y.o. Male  MRN: [id number] Visit Date: 04/03/2022  Today's healthcare provider: [name], MD   No chief complaint on file.  Subjective    Sinusitis This is a new problem. The current episode started in the past 7 days. The problem has been gradually worsening since onset. There has been no fever. Associated symptoms include congestion, coughing, headaches and sinus pressure. Pertinent negatives include no ear pain, hoarse voice, shortness of breath, sneezing or sore throat.   Diabetes Mellitus Type II, Follow-up  Lab Results  Component Value Date   HGBA1C 8.4 (H) 12/23/2021   HGBA1C 7.4 (H) 04/27/2021   HGBA1C 6.4 (A) 03/10/2021   Wt Readings from Last 3 Encounters:  05/01/21 214 lb 1.1 oz (97.1 kg)  03/10/21 220 lb (99.8 kg)  01/03/21 223 lb 8 oz (101.4 kg)   Last seen for diabetes 3 months ago.  Management since then includes continue current medication treatment.Work on diet and exercise He reports excellent compliance with treatment. He is not having side effects.  Symptoms: No fatigue No foot ulcerations  No appetite changes No nausea  No paresthesia of the feet  No polydipsia  No polyuria No visual disturbances   No vomiting     Home blood sugar records: fasting range: Average 125  Episodes of hypoglycemia? No    Current insulin regiment:  Most Recent Eye Exam: Pt is due for an eye exam. Current exercise: Some Current diet habits: in general, a "healthy" diet    Pertinent Labs: Lab Results  Component Value Date   CHOL 117 12/23/2021   HDL 29 (L) 12/23/2021   LDLCALC 63 12/23/2021   LDLDIRECT 122.1 (H) 09/27/2020   TRIG 144 12/23/2021   CHOLHDL 4.0  12/23/2021   Lab Results  Component Value Date   NA 145 (H) 12/23/2021   K 3.7 12/23/2021   CREATININE 0.88 12/23/2021   EGFR 94 12/23/2021   MICROALBUR 50 10/28/2018   LABMICR See below: 05/25/2019     ---------------------------------------------------------------------------------------------------   Medications: Outpatient Medications Prior to Visit  Medication Sig   amLODipine (NORVASC) 2.5 MG tablet TAKE 1 TABLET BY MOUTH DAILY   methocarbamol (ROBAXIN) 500 MG tablet TAKE 1 TABLET BY MOUTH EVERY 8 HOURS AS NEEDED FOR MUSCLE SPASMS   omeprazole (PRILOSEC) 40 MG capsule TAKE 1 CAPSULE BY MOUTH 2 TIMES DAILY 30 MINUTES BEFORE BREAKFAST AND DINNER   Accu-Chek Softclix Lancets lancets Use as instructed to check sugar daily for type 2 diabetes.   albuterol (VENTOLIN HFA) 108 (90 Base) MCG/ACT inhaler INHALE 2 PUFFS BY MOUTH EVERY 6 HOURS AS NEEDED FOR SHORTNESS OF BREATH   allopurinol (ZYLOPRIM) 300 MG tablet TAKE 1 TABLET BY MOUTH TWICE A DAY   ALPRAZolam (XANAX) 1 MG tablet Take 1 tablet (1 mg total) by mouth 3 (three) times daily.   aspirin 81 MG chewable tablet Chew 81 mg by mouth daily. Swallows whole   atorvastatin (LIPITOR) 80 MG tablet TAKE ONE TABLET BY MOUTH AT BEDTIME   Blood Glucose Calibration (ACCU-CHEK GUIDE CONTROL) LIQD Use with blood glucose monitor as directed   Blood Glucose Monitoring Suppl (ACCU-CHEK GUIDE) w/Device KIT Use to check blood sugars as directed  celecoxib (CELEBREX) 200 MG capsule Take 1 capsule by mouth 2 (two) times daily.   cetirizine (ZYRTEC) 10 MG tablet TAKE ONE TABLET BY MOUTH AT BEDTIME   docusate sodium (COLACE) 100 MG capsule Take 100 mg by mouth daily as needed for mild constipation. (Patient not taking: Reported on 01/08/2022)   ELIQUIS 5 MG TABS tablet TAKE 1 TABLET BY MOUTH TWICE A DAY   FARXIGA 10 MG TABS tablet TAKE 1 TABLET BY MOUTH DAILY   fluticasone furoate-vilanterol (BREO ELLIPTA) 100-25 MCG/INH AEPB INHALE 1 PUFF BY MOUTH INTO  THE LUNGS DAILY.   glucose blood (ACCU-CHEK GUIDE) test strip Use as instructed to check sugar daily for type 2 diabetes.   isosorbide mononitrate (IMDUR) 120 MG 24 hr tablet TAKE 1 TABLET BY MOUTH DAILY   isosorbide mononitrate (IMDUR) 60 MG 24 hr tablet Take by mouth.   linaclotide (LINZESS) 72 MCG capsule Take 72 mcg by mouth daily before breakfast.    lisinopril (ZESTRIL) 10 MG tablet TAKE 1 TABLET BY MOUTH DAILY   magnesium oxide (MAG-OX) 400 MG tablet Take 1 tablet by mouth daily.   metFORMIN (GLUCOPHAGE-XR) 500 MG 24 hr tablet TAKE 1 TABLET BY MOUTH TWICE A DAY   naloxone (NARCAN) nasal spray 4 mg/0.1 mL 1 spray into nostril x1 and may repeat every 2-3 minutes until patient is responsive or EMS arrives   nitroGLYCERIN (NITROSTAT) 0.4 MG SL tablet PLACE ONE TABLET UNDER THE TONGUE EVERY 5 MINUTES FOR CHEST PAIN. IF NO RELIEF PAST 3RD TAB GO TO EMERGENCY ROOM   omega-3 acid ethyl esters (LOVAZA) 1 g capsule TAKE 4 CAPSULES BY MOUTH DAILY   oxyCODONE-acetaminophen (PERCOCET) 10-325 MG tablet TAKE 1 TABLET BY MOUTH EVERY 4 HOURS AS NEEDED FOR PAIN   ranolazine (RANEXA) 1000 MG SR tablet Take 1 tablet (1,000 mg total) by mouth 2 (two) times daily.   sotalol (BETAPACE) 80 MG tablet Take 40 mg by mouth 2 (two) times daily.   tiotropium (SPIRIVA HANDIHALER) 18 MCG inhalation capsule Place 1 capsule (18 mcg total) into inhaler and inhale daily.   torsemide (DEMADEX) 100 MG tablet Take 0.5 tablets (50 mg total) by mouth daily.   traZODone (DESYREL) 150 MG tablet TAKE 1 TABLET BY MOUTH AT BEDTIME   venlafaxine XR (EFFEXOR-XR) 75 MG 24 hr capsule TAKE 1 CAPSULE BY MOUTH DAILY WITH BREAKFAST   No facility-administered medications prior to visit.    Review of Systems  Constitutional: Negative.   HENT:  Positive for congestion, sinus pressure and sinus pain. Negative for ear discharge, ear pain, hearing loss, hoarse voice, postnasal drip, rhinorrhea, sneezing and sore throat.   Respiratory:   Positive for cough. Negative for apnea, choking, chest tightness, shortness of breath and stridor.   Cardiovascular: Negative.   Gastrointestinal: Negative.   Neurological:  Positive for light-headedness and headaches. Negative for dizziness.      Objective    BP (!) 156/102 (BP Location: Right Arm, Patient Position: Sitting, Cuff Size: Normal)   Pulse 67   Temp 97.6 F (36.4 C) (Oral)   Wt 194 lb (88 kg)   SpO2 96%   BMI 30.38 kg/m    Physical Exam   General: Appearance:    Mildly obese male in no acute distress  HEENT:   Mildly tender frontal and maxillary sinuses. Nasal turbinates congested  Eyes:    PERRL, conjunctiva/corneas clear, EOM's intact       Lungs:     Clear to auscultation bilaterally, respirations unlabored  Heart:  Normal heart rate. Irregularly irregular rhythm. No murmurs, rubs, or gallops.    MS:   All extremities are intact.    Neurologic:   Awake, alert, oriented x 3. No apparent focal neurological defect.        Results for orders placed or performed in visit on 04/03/22  POCT glycosylated hemoglobin (Hb A1C)  Result Value Ref Range   Hemoglobin A1C 7.2 (A) 4.0 - 5.6 %     Assessment & Plan     1. Type 2 diabetes mellitus with diabetic nephropathy, without long-term current use of insulin (HCC) Very well controlled.   2. Acute non-recurrent frontal sinusitis  - azithromycin (ZITHROMAX) 250 MG tablet; Take 2 tablets on day 1, then 1 tablet daily on days 2 through 5  Dispense: 6 tablet; Refill: 0  3. Primary insomnia He states trazodone works sometimes, but other nights cannot sleep at all. Would like to try another mediation. Can try adding  Suvorexant (BELSOMRA) 5 MG TABS; Take 5 mg by mouth at bedtime as needed.  Dispense: 30 tablet; Refill: 0  4. Recurrent major depressive disorder, in full remission ([place]) Aside from trouble sleeping his mood has been very good and will continue current dose of venlafaxine.   5. Atherosclerosis of aorta  ([place]) He is tolerating atorvastatin well with no adverse effects.     6. Hypertension He states he forgot to take his morning medications today and was in  rush to get to the office, but home Bps usually in the 120s and 130s.      The entirety of the information documented in the History of Present Illness, Review of Systems and Physical Exam were personally obtained by me. Portions of this information were initially documented by the CMA and reviewed by me for thoroughness and accuracy.     [name], MD  [facility] [phone] (phone) [phone] (fax)  [place]

## 2022-04-02 DIAGNOSIS — J449 Chronic obstructive pulmonary disease, unspecified: Secondary | ICD-10-CM | POA: Diagnosis not present

## 2022-04-03 ENCOUNTER — Encounter: Payer: Self-pay | Admitting: Family Medicine

## 2022-04-03 ENCOUNTER — Ambulatory Visit (INDEPENDENT_AMBULATORY_CARE_PROVIDER_SITE_OTHER): Payer: Medicare HMO | Admitting: Family Medicine

## 2022-04-03 VITALS — BP 156/102 | HR 67 | Temp 97.6°F | Wt 194.0 lb

## 2022-04-03 DIAGNOSIS — F5101 Primary insomnia: Secondary | ICD-10-CM | POA: Diagnosis not present

## 2022-04-03 DIAGNOSIS — J0110 Acute frontal sinusitis, unspecified: Secondary | ICD-10-CM

## 2022-04-03 DIAGNOSIS — E1121 Type 2 diabetes mellitus with diabetic nephropathy: Secondary | ICD-10-CM

## 2022-04-03 DIAGNOSIS — I700 Atherosclerosis of aorta: Secondary | ICD-10-CM | POA: Diagnosis not present

## 2022-04-03 DIAGNOSIS — F3342 Major depressive disorder, recurrent, in full remission: Secondary | ICD-10-CM | POA: Diagnosis not present

## 2022-04-03 LAB — POCT GLYCOSYLATED HEMOGLOBIN (HGB A1C): Hemoglobin A1C: 7.2 % — AB (ref 4.0–5.6)

## 2022-04-03 MED ORDER — BELSOMRA 5 MG PO TABS: 5.0000 mg | ORAL_TABLET | Freq: Every evening | ORAL | 0 refills | Status: DC | PRN

## 2022-04-03 MED ORDER — AZITHROMYCIN 250 MG PO TABS: ORAL_TABLET | ORAL | 0 refills | Status: AC

## 2022-04-05 IMAGING — DX DG FOREARM 2V*L*
2 series · 2 of 2 positions shown · non-contrast
Comparison: No prior.

CLINICAL DATA: Injury.  Pain.

EXAM:
LEFT FOREARM - 2 VIEW

[forearm ap]
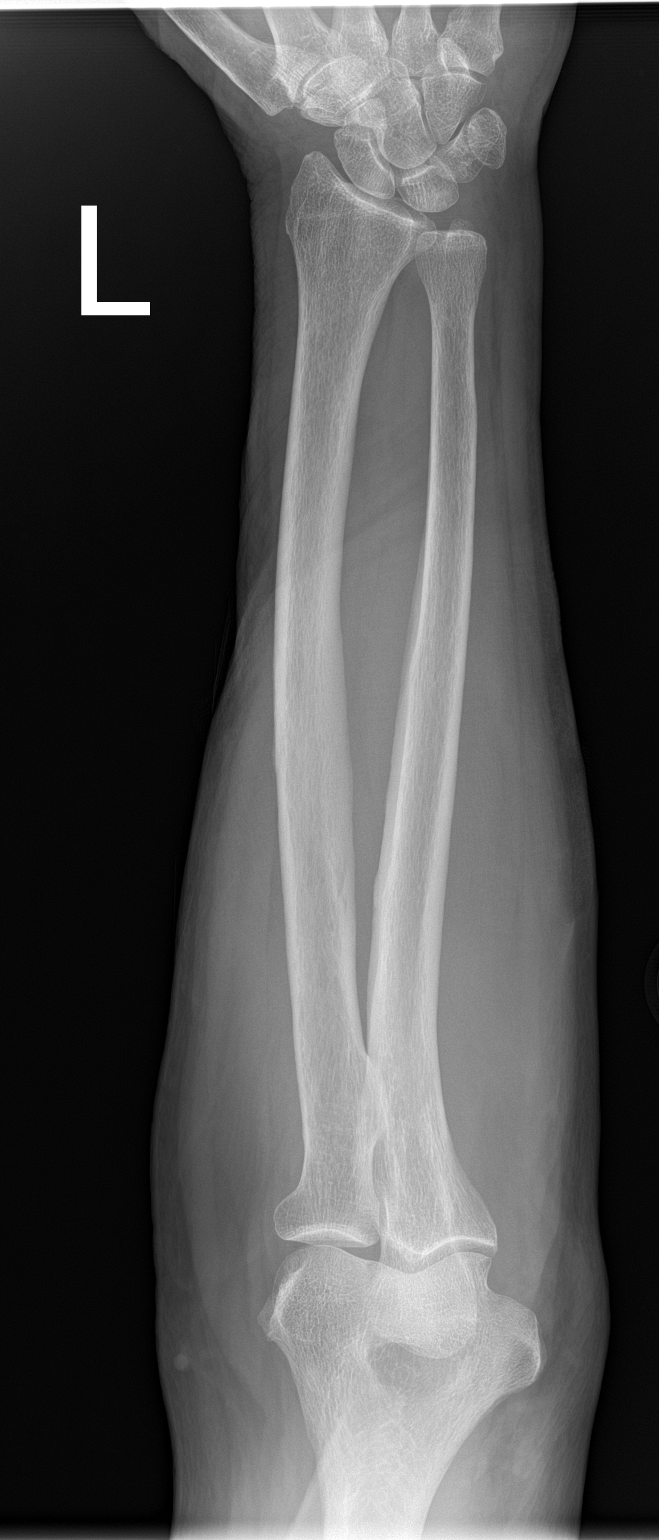

[forearm lat]
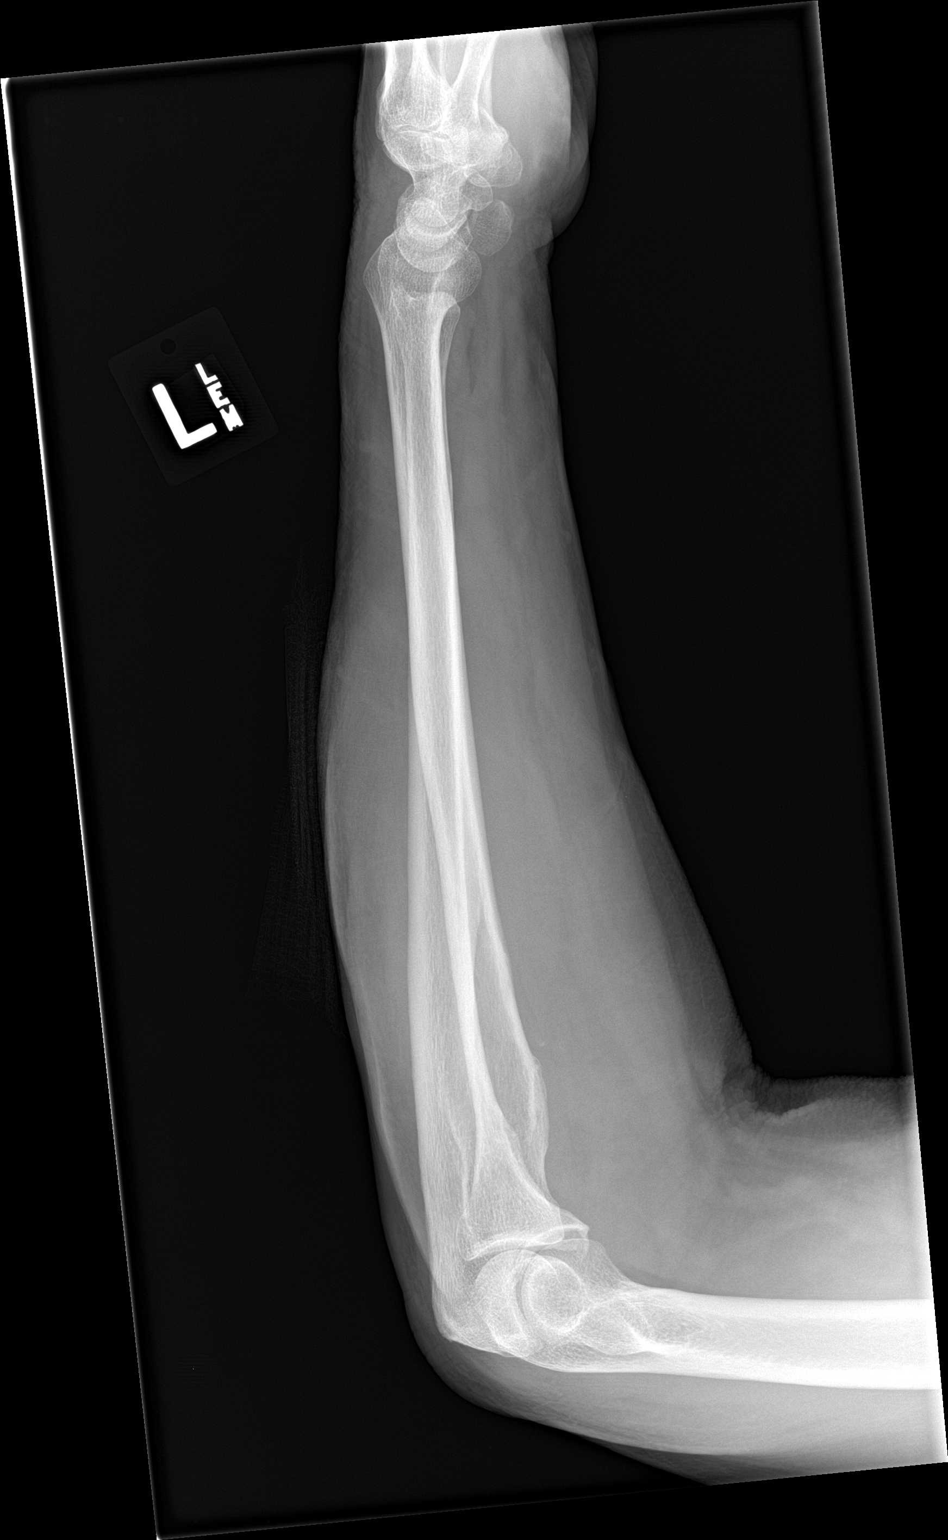

[2 of 2 positions shown; findings below may reference images not displayed]

FINDINGS: No acute bony or joint abnormality. No evidence of fracture or
dislocation.
IMPRESSION: No acute abnormality identified.

## 2022-04-07 ENCOUNTER — Ambulatory Visit: Payer: Self-pay

## 2022-04-07 NOTE — Telephone Encounter (Signed)
Pt requests Rx for nausea medication to be sent to [place], [place] Phone: [phone]  Fax: [phone]. Pt stated he does not know the name of the medication but Dr. [name] prescribes it to him.               Left message to call back about symptoms.

## 2022-04-07 NOTE — Telephone Encounter (Signed)
  Chief Complaint: Nausea Symptoms: Nausea, no vomiting :Dry heaving" States is able to eat "Not like usual" States is staying hydrated. Also reports heartburn,took Maalox, ineffective. States has had before, "Dr. [name] usually calls something in for me when I get this." Advised may need appt.State seen 04/03/22. Took last dose of antibiotic today. Frequency: Yesterday Pertinent Negatives: Patient denies  Disposition: [] ED /[] Urgent Care (no appt availability in office) / [] Appointment(In office/virtual)/ []  [facility]/ [] Home Care/ [] Refused Recommended Disposition /[] [facility]/ [x]  Follow-up with PCP Additional Notes: Home care advise provided, pt verbalizes understanding. Pt requested anti-nausea med called in "Can't remember name of what he calls in for me."  NauseaReason for Disposition  Unexplained nausea  Answer Assessment - Initial Assessment Questions 1. NAUSEA SEVERITY: "How bad is the nausea?" (e.g., mild, moderate, severe; dehydration, weight loss)   - MILD: loss of appetite without change in eating habits   - MODERATE: decreased oral intake without significant weight loss, dehydration, or malnutrition   - SEVERE: inadequate caloric or fluid intake, significant weight loss, symptoms of dehydration     8/10 2. ONSET: "When did the nausea begin?"     Yesterday 3. VOMITING: "Any vomiting?" If Yes, ask: "How many times today?"     "Dry heaving" 4. RECURRENT SYMPTOM: "Have you had nausea before?" If Yes, ask: "When was the last time?" "What happened that time?"    Yes, Unsure what was wrong 5. CAUSE: "What do you think is causing the nausea?"     Unsure  Protocols used: Nausea-A-AH

## 2022-04-07 NOTE — Telephone Encounter (Signed)
Second attempt to reach pt. Left message to call back.

## 2022-04-08 ENCOUNTER — Telehealth: Payer: Self-pay | Admitting: Family Medicine

## 2022-04-08 MED ORDER — ONDANSETRON 4 MG PO TBDP: 4.0000 mg | ORAL_TABLET | Freq: Three times a day (TID) | ORAL | 1 refills | Status: DC | PRN

## 2022-04-08 NOTE — Telephone Encounter (Signed)
PT has called back again re NT call yesterday for nausea med, see NT note FU with pt re at [phone]

## 2022-04-08 NOTE — Telephone Encounter (Signed)
[facility]

## 2022-04-16 ENCOUNTER — Other Ambulatory Visit: Payer: Self-pay | Admitting: Family Medicine

## 2022-04-16 DIAGNOSIS — M5410 Radiculopathy, site unspecified: Secondary | ICD-10-CM

## 2022-05-03 DIAGNOSIS — J449 Chronic obstructive pulmonary disease, unspecified: Secondary | ICD-10-CM | POA: Diagnosis not present

## 2022-05-11 ENCOUNTER — Other Ambulatory Visit: Payer: Self-pay | Admitting: Family Medicine

## 2022-05-11 ENCOUNTER — Ambulatory Visit: Payer: Self-pay

## 2022-05-11 DIAGNOSIS — E1121 Type 2 diabetes mellitus with diabetic nephropathy: Secondary | ICD-10-CM

## 2022-05-11 DIAGNOSIS — I509 Heart failure, unspecified: Secondary | ICD-10-CM

## 2022-05-11 DIAGNOSIS — J449 Chronic obstructive pulmonary disease, unspecified: Secondary | ICD-10-CM

## 2022-05-11 DIAGNOSIS — F5101 Primary insomnia: Secondary | ICD-10-CM

## 2022-05-11 NOTE — Chronic Care Management (AMB) (Signed)
Chronic Care Management   CCM RN Visit Note  05/11/2022 Name: Lawrence [name] MRN: [id number] DOB: 01-13-1956  Subjective: Lawrence [name] is a 66 y.o. year old male who is a primary care patient of [name], Demetrios Isaacs, MD. The care management team was consulted for assistance with disease management and care coordination needs.    Engaged with patient by telephone for follow up visit in response to provider referral for case management and care coordination services.   Consent to Services:  The patient was given information about Chronic Care Management services, agreed to services, and gave verbal consent prior to initiation of services.  Please see initial visit note for detailed documentation.   Assessment: Review of patient past medical history, allergies, medications, health status, including review of consultants reports, laboratory and other test data, was performed as part of comprehensive evaluation and provision of chronic care management services.   SDOH (Social Determinants of Health) assessments and interventions performed: No  CCM Care Plan  Allergies  Allergen Reactions   Demerol  [Meperidine Hcl]    Demerol [Meperidine] Hives   Jardiance [Empagliflozin] Other (See Comments)    Perineal pain   Prednisone Other (See Comments) and Hypertension    Pt states that this medication puts him in A-fib    Sulfa Antibiotics Hives   Albuterol Sulfate [Albuterol] Palpitations and Other (See Comments)    Pt currently uses this medication.     Morphine Sulfate Nausea And Vomiting, Rash and Other (See Comments)    Pt states that he is only allergic to the tablet form of this medication.      Outpatient Encounter Medications as of 05/11/2022  Medication Sig Note   Accu-Chek Softclix Lancets lancets Use as instructed to check sugar daily for type 2 diabetes.    albuterol (VENTOLIN HFA) 108 (90 Base) MCG/ACT inhaler INHALE 2 PUFFS BY MOUTH EVERY 6 HOURS AS NEEDED FOR SHORTNESS OF  BREATH    allopurinol (ZYLOPRIM) 300 MG tablet TAKE 1 TABLET BY MOUTH TWICE A DAY    ALPRAZolam (XANAX) 1 MG tablet Take 1 tablet (1 mg total) by mouth 3 (three) times daily.    amLODipine (NORVASC) 2.5 MG tablet TAKE 1 TABLET BY MOUTH DAILY    aspirin 81 MG chewable tablet Chew 81 mg by mouth daily. Swallows whole    atorvastatin (LIPITOR) 80 MG tablet TAKE 1 TABLET BY MOUTH AT BEDTIME    Blood Glucose Calibration (ACCU-CHEK GUIDE CONTROL) LIQD Use with blood glucose monitor as directed    Blood Glucose Monitoring Suppl (ACCU-CHEK GUIDE) w/Device KIT Use to check blood sugars as directed    celecoxib (CELEBREX) 200 MG capsule Take 1 capsule by mouth 2 (two) times daily.    cetirizine (ZYRTEC) 10 MG tablet TAKE 1 TABLET BY MOUTH AT BEDTIME    docusate sodium (COLACE) 100 MG capsule Take 100 mg by mouth daily as needed for mild constipation.    ELIQUIS 5 MG TABS tablet TAKE 1 TABLET BY MOUTH TWICE A DAY    FARXIGA 10 MG TABS tablet TAKE 1 TABLET BY MOUTH DAILY    fluticasone furoate-vilanterol (BREO ELLIPTA) 100-25 MCG/INH AEPB INHALE 1 PUFF BY MOUTH INTO THE LUNGS DAILY.    glucose blood (ACCU-CHEK GUIDE) test strip Use as instructed to check sugar daily for type 2 diabetes.    isosorbide mononitrate (IMDUR) 120 MG 24 hr tablet TAKE 1 TABLET BY MOUTH DAILY    isosorbide mononitrate (IMDUR) 60 MG 24 hr tablet Take by  mouth.    linaclotide (LINZESS) 72 MCG capsule Take 72 mcg by mouth daily before breakfast.     lisinopril (ZESTRIL) 10 MG tablet TAKE 1 TABLET BY MOUTH DAILY    magnesium oxide (MAG-OX) 400 MG tablet Take 1 tablet by mouth daily.    metFORMIN (GLUCOPHAGE-XR) 500 MG 24 hr tablet TAKE 1 TABLET BY MOUTH TWICE A DAY    methocarbamol (ROBAXIN) 500 MG tablet TAKE 1 TABLET BY MOUTH EVERY 8 HOURS AS NEEDED FOR MUSCLE SPASMS    naloxone (NARCAN) nasal spray 4 mg/0.1 mL 1 spray into nostril x1 and may repeat every 2-3 minutes until patient is responsive or EMS arrives    nitroGLYCERIN  (NITROSTAT) 0.4 MG SL tablet PLACE ONE TABLET UNDER THE TONGUE EVERY 5 MINUTES FOR CHEST PAIN. IF NO RELIEF PAST 3RD TAB GO TO EMERGENCY ROOM    omega-3 acid ethyl esters (LOVAZA) 1 g capsule TAKE 4 CAPSULES BY MOUTH DAILY    omeprazole (PRILOSEC) 40 MG capsule TAKE 1 CAPSULE BY MOUTH 2 TIMES DAILY 30 MINUTES BEFORE BREAKFAST AND DINNER    ondansetron (ZOFRAN-ODT) 4 MG disintegrating tablet Take 1 tablet (4 mg total) by mouth every 8 (eight) hours as needed for nausea or vomiting.    oxyCODONE-acetaminophen (PERCOCET) 10-325 MG tablet TAKE 1 TABLET BY MOUTH EVERY 4 HOURS AS NEEDED FOR PAIN    ranolazine (RANEXA) 1000 MG SR tablet Take 1 tablet (1,000 mg total) by mouth 2 (two) times daily.    Suvorexant (BELSOMRA) 5 MG TABS Take 5 mg by mouth at bedtime as needed.    tiotropium (SPIRIVA HANDIHALER) 18 MCG inhalation capsule Place 1 capsule (18 mcg total) into inhaler and inhale daily.    torsemide (DEMADEX) 100 MG tablet Take 0.5 tablets (50 mg total) by mouth daily. 01/15/2021: Reports taking as needed.   traZODone (DESYREL) 150 MG tablet TAKE 1 TABLET BY MOUTH AT BEDTIME    venlafaxine XR (EFFEXOR-XR) 75 MG 24 hr capsule TAKE 1 CAPSULE BY MOUTH DAILY WITH BREAKFAST    No facility-administered encounter medications on file as of 05/11/2022.    Patient Active Problem List   Diagnosis Date Noted   Chronic diastolic heart failure (HCC)    Atherosclerosis of aorta (HCC) 12/16/2020   Syncope 12/11/2020   Acidosis 12/11/2020   Polycythemia 10/05/2020   Primary insomnia 05/13/2020   Recurrent major depressive disorder, remission status unspecified (HCC) 03/29/2020   Chronic respiratory failure with hypoxia (HCC) 05/29/2019   Morbid obesity (HCC) 05/29/2019   Trigger thumb of right hand 11/28/2018   History of adenomatous polyp of colon 04/05/2018   CKD (chronic kidney disease) stage 2, GFR 60-89 ml/min 04/03/2016   Anemia 04/03/2016   Atrial fibrillation (HCC) 12/23/2015   OSA (obstructive  sleep apnea) 12/10/2015   Left inguinal hernia 11/07/2015   Anxiety 11/07/2015   Back pain with left-sided radiculopathy 09/30/2015   Nocturnal hypoxia 09/06/2015   BPH (benign prostatic hyperplasia) 08/01/2015   Chronic systolic CHF (congestive heart failure) (HCC)    Angina pectoris (HCC)    COPD (chronic obstructive pulmonary disease) (HCC) 07/03/2015   CAD (coronary artery disease) 06/26/2015   Primary hypertension 06/26/2015   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HCC) 06/26/2015   Achalasia 07/24/2014   GERD (gastroesophageal reflux disease) 06/07/2014   Former tobacco use 04/11/2013   HLD (hyperlipidemia) 04/09/2013   Patient Care Plan: RN Care Management Plan of Care     Problem Identified: A-Fib, CHF, COPD, HLD, HTN  Long-Range Goal: Disease Progression Prevented or Minimized Completed 05/11/2022  Start Date: 01/16/2022  Expected End Date: 04/16/2022  Priority: High  Note:   Current Barriers:  Chronic Disease Management support and education needs related to Atrial Fibrillation, CHF, HTN, DM and COPD   RNCM Clinical Goal(s):  Patient will demonstrate ongoing adherence to prescribed treatment plan for Atrial Fibrillation, CHF, HTN, COPD, and DM through collaboration with the provider, RN Care manager, and the care management team.  Interventions: 1:1 collaboration with primary care provider regarding development and update of comprehensive plan of care as evidenced by provider attestation and co-signature Inter-disciplinary care team collaboration (see longitudinal plan of care) Evaluation of current treatment plan related to  self management and patient's adherence to plan as established by provider   AFIB Interventions: (Goal Met) Discussed increased risk of stroke due to Afib and benefits of anticoagulation for stroke prevention Reviewed importance of adherence to anticoagulant exactly as prescribed Discussed bleeding risk associated with Eliquis and  importance of self-monitoring for signs/symptoms of bleeding Discussed need to avoid NSAIDs due to increased bleeding risk with anticoagulants Discussed importance of regular laboratory monitoring as prescribed Advised to seek immediate medical attention after a head injury or if there is blood in the urine/stool   Heart Failure Interventions:   Discussed current plan for CHF management.  Reviewed established weight parameters. Reminded of need to notify a provider for weight gain greater than 3 lbs overnight or weight gain greater than 5 lbs within a week. Reviewed s/sx of fluid overload and indications for notifying a provider. Encouraged to assess symptoms daily and contact clinic with concerns as needed. Denies episodes of chest discomfort or palpitations. Continues to experience dyspnea with moderate exertion. Reports activity tolerance has not declined. Denies increased abdominal or lower extremity edema. Reviewed nutritional intake. Advised to continue monitoring sodium consumption and avoiding highly processed foods when possible. Reviewed worsening s/sx related to CHF exacerbation and indications for seeking immediate medical attention. Declines need for further interventions  COPD Interventions:   Reviewed plan for COPD management. Patient reports having all needed medications and remains compliant with treatment plan. Symptoms have been well controlled. Verbalized knowledge of need to contact a provider if experiencing moderate symptoms for 48 hours without improvement. Agreed to contact the clinic with questions and concerns as needed. Declines need for further interventions  Diabetes Interventions:   Reviewed plan for diabetes management. Reports excellent compliance with medications.  Discussed importance of consistent blood glucose monitoring. Encouraged to monitor and maintain a log. Reports home readings have been within range. Reviewed s/sx of hypoglycemia and hyperglycemia  along with recommended interventions. Declines hypoglycemic or hyperglycemic episodes. Reports adequate nutritional intake. Attempting to adhere to the recommended ADA/modified carb diet.  Declined need for further interventions   Hypertension Interventions:  Discussed plan for blood pressure management. Reports taking medications as prescribed. Attempting to adhere to recommended diet. Reports readings have been within range. Encouraged to notify a provider with abnormal readings. Reviewed s/sx of heart attack, stroke and worsening symptoms that require immediate medical attention. Declined need for further interventions   Patient Goals/Self-Care Activities: Take all medications as prescribed Attend all scheduled provider appointments Call pharmacy for medication refills 3-7 days in advance of running out of medications Perform all self care activities independently  Call provider office for new concerns or questions       PLAN Mr. Osler agreed to contact the clinic if he requires additional outreach. The care team will gladly assist.  [facility] Care Management [phone]

## 2022-05-14 ENCOUNTER — Other Ambulatory Visit: Payer: Self-pay | Admitting: Family Medicine

## 2022-05-14 DIAGNOSIS — M5410 Radiculopathy, site unspecified: Secondary | ICD-10-CM

## 2022-05-14 NOTE — Telephone Encounter (Signed)
Medication Refill - Medication: ALPRAZolam (XANAX) 1 MG tablet  Has the patient contacted their pharmacy? Yes.   (Agent: If no, request that the patient contact the pharmacy for the refill. If patient does not wish to contact the pharmacy document the reason why and proceed with request.) (Agent: If yes, when and what did the pharmacy advise?)request was sent for only 1 RX but not this one   Preferred Pharmacy (with phone number or street name): [facility], [address]  [place], [place] [address]  Phone:  [phone]  Fax:  [phone]  DEA #:  -- Has the patient been seen for an appointment in the last year OR does the patient have an upcoming appointment? Yes.    Agent: Please be advised that RX refills may take up to 3 business days. We ask that you follow-up with your pharmacy.

## 2022-06-01 ENCOUNTER — Other Ambulatory Visit: Payer: Self-pay | Admitting: Family Medicine

## 2022-06-02 NOTE — Telephone Encounter (Signed)
Requested Prescriptions  Pending Prescriptions Disp Refills  . SPIRIVA HANDIHALER 18 MCG inhalation capsule [Pharmacy Med Name: SPIRIVA HANDIHALER 18 MCG INH CAP] 30 capsule 6    Sig: PLACE 1 CAPSULE INTO INHALER AND INHALE BY MOUTH ONCE A DAY.     Pulmonology:  Anticholinergic Agents Passed - 06/01/2022 11:32 AM      Passed - Valid encounter within last 12 months    Recent Outpatient Visits          2 months ago Type 2 diabetes mellitus with diabetic nephropathy, without long-term current use of insulin [facility])   [facility] Birdie Sons, MD   5 months ago OSA (obstructive sleep apnea)   [facility] Myles Gip, DO   1 year ago Type 2 diabetes mellitus with diabetic nephropathy, without long-term current use of insulin [facility])   [facility] Birdie Sons, MD   1 year ago Type 2 diabetes mellitus with diabetic nephropathy, without long-term current use of insulin [facility])   [facility] Birdie Sons, MD   1 year ago Chest wall pain   [facility] Birdie Sons, MD      Future Appointments            In 2 months [name], [name], MD [facility] [place]

## 2022-06-03 ENCOUNTER — Ambulatory Visit: Payer: Self-pay | Admitting: *Deleted

## 2022-06-03 NOTE — Telephone Encounter (Signed)
  Chief Complaint: tip of toe red Symptoms: sort of redish/blackish Frequency: constant Pertinent Negatives: Patient denies fever Disposition: [] ED /[] Urgent Care (no appt availability in office) / [x] Appointment(In office/virtual)/ []  [facility]/ [] Home Care/ [] Refused Recommended Disposition /[] [facility]/ []  Follow-up with PCP Additional Notes: Offered appt for this afternoon pt declined and took appt for tomorrow afternoon. To call back if worsens or if can make it to the late afternoon appt that is open today.  Reason for Disposition  Diabetes mellitus or peripheral vascular disease ("poor circulation")  Answer Assessment - Initial Assessment Questions 1. ONSET: "When did the pain start?"      3 days getting worse 2. LOCATION: "Where is the pain located?"   (e.g., around nail, entire toe, at foot joint)      Tip of left 2nd toe 3. PAIN: "How bad is the pain?"    (Scale 1-10; or mild, moderate, severe)   -  MILD (1-3): doesn't interfere with normal activities    -  MODERATE (4-7): interferes with normal activities (e.g., work or school) or awakens from sleep, limping    -  SEVERE (8-10): excruciating pain, unable to do any normal activities, unable to walk     9 4. APPEARANCE: "What does the toe look like?" (e.g., redness, swelling, bruising, pallor)     Red and sometimes looks blackish red 5. CAUSE: "What do you think is causing the toe pain?"     diabetes 6. OTHER SYMPTOMS: "Do you have any other symptoms?" (e.g., leg pain, rash, fever, numbness)     no 7. PREGNANCY: "Is there any chance you are pregnant?" "When was your last menstrual period?"     na  Protocols used: Toe Pain-A-AH

## 2022-06-04 ENCOUNTER — Ambulatory Visit
Admission: RE | Admit: 2022-06-04 | Discharge: 2022-06-04 | Disposition: A | Discharge status: Home / Self Care | Payer: Medicare HMO | Attending: Family Medicine | Admitting: Family Medicine

## 2022-06-04 ENCOUNTER — Encounter: Payer: Self-pay | Admitting: Family Medicine

## 2022-06-04 ENCOUNTER — Ambulatory Visit
Admission: RE | Admit: 2022-06-04 | Discharge: 2022-06-04 | Disposition: A | Discharge status: Home / Self Care | Payer: Medicare HMO | Source: Ambulatory Visit | Attending: Family Medicine | Admitting: Family Medicine

## 2022-06-04 ENCOUNTER — Ambulatory Visit (INDEPENDENT_AMBULATORY_CARE_PROVIDER_SITE_OTHER): Payer: Medicare HMO | Admitting: Family Medicine

## 2022-06-04 VITALS — BP 120/75 | HR 119 | Temp 98.0°F | Resp 16 | Wt 197.0 lb

## 2022-06-04 DIAGNOSIS — I25708 Atherosclerosis of coronary artery bypass graft(s), unspecified, with other forms of angina pectoris: Secondary | ICD-10-CM | POA: Diagnosis not present

## 2022-06-04 DIAGNOSIS — I5032 Chronic diastolic (congestive) heart failure: Secondary | ICD-10-CM | POA: Diagnosis not present

## 2022-06-04 DIAGNOSIS — Z951 Presence of aortocoronary bypass graft: Secondary | ICD-10-CM | POA: Diagnosis not present

## 2022-06-04 DIAGNOSIS — M79675 Pain in left toe(s): Secondary | ICD-10-CM | POA: Insufficient documentation

## 2022-06-04 DIAGNOSIS — R079 Chest pain, unspecified: Secondary | ICD-10-CM | POA: Diagnosis not present

## 2022-06-04 DIAGNOSIS — E782 Mixed hyperlipidemia: Secondary | ICD-10-CM | POA: Diagnosis not present

## 2022-06-04 DIAGNOSIS — Z955 Presence of coronary angioplasty implant and graft: Secondary | ICD-10-CM | POA: Diagnosis not present

## 2022-06-04 DIAGNOSIS — I10 Essential (primary) hypertension: Secondary | ICD-10-CM | POA: Diagnosis not present

## 2022-06-04 DIAGNOSIS — E669 Obesity, unspecified: Secondary | ICD-10-CM | POA: Diagnosis not present

## 2022-06-04 DIAGNOSIS — M25511 Pain in right shoulder: Secondary | ICD-10-CM | POA: Insufficient documentation

## 2022-06-04 DIAGNOSIS — I255 Ischemic cardiomyopathy: Secondary | ICD-10-CM | POA: Diagnosis not present

## 2022-06-04 DIAGNOSIS — Z95828 Presence of other vascular implants and grafts: Secondary | ICD-10-CM | POA: Diagnosis not present

## 2022-06-04 NOTE — Progress Notes (Signed)
Lawrence [name],Lawrence [name],acting as a Education administrator for Gwyneth Sprout, FNP.,have documented all relevant documentation on the behalf of Gwyneth Sprout, FNP,as directed by  Gwyneth Sprout, FNP while in the presence of Gwyneth Sprout, FNP.   Established patient visit   Patient: Lawrence [name]   DOB: 06-13-1956   66 y.o. Male  MRN: [id number] Visit Date: 06/04/2022  Today's healthcare provider: Gwyneth Sprout, FNP  Introduced to nurse practitioner role and practice setting.  All questions answered.  Discussed provider/patient relationship and expectations.   Chief Complaint  Patient presents with   Toe Pain   Subjective    HPI  Pain  He reports new onset toe on left foot pain. was an injury that may have caused the pain. The pain started a few days ago and is worsening. The pain does not radiate . The pain is described as sharp and stabbing, is moderate in intensity, occurring constantly. Symptoms are worse in the: same all day  Aggravating factors: walking  Relieving factors: none.  He has tried application of ice with no relief.   --------------------------------------------------------------------------------------------------- Pain  He reports new onset right shoulder pain. was an injury that may have caused the pain. The pain started a few days ago and is worsening. The pain does not radiate. The pain is described as aching, stiffness, throbbing, and tingling, is moderate in intensity, occurring constantly. Symptoms are worse in the: same all the time  Aggravating factors: activity  Relieving factors: none .  He has tried prescription pain relievers with no relief.   ---------------------------------------------------------------------------------------------------   Medications: Outpatient Medications Prior to Visit  Medication Sig   Accu-Chek Softclix Lancets lancets Use as instructed to check sugar daily for type 2 diabetes.   albuterol (VENTOLIN HFA) 108 (90 Base) MCG/ACT inhaler  INHALE 2 PUFFS BY MOUTH EVERY 6 HOURS AS NEEDED FOR SHORTNESS OF BREATH   allopurinol (ZYLOPRIM) 300 MG tablet TAKE 1 TABLET BY MOUTH TWICE A DAY   ALPRAZolam (XANAX) 1 MG tablet Take 1 tablet (1 mg total) by mouth 3 (three) times daily.   amLODipine (NORVASC) 2.5 MG tablet TAKE 1 TABLET BY MOUTH DAILY   aspirin 81 MG chewable tablet Chew 81 mg by mouth daily. Swallows whole   atorvastatin (LIPITOR) 80 MG tablet TAKE 1 TABLET BY MOUTH AT BEDTIME   BELSOMRA 5 MG TABS TAKE 1 TABLET (5 MG TOTAL) BY MOUTH AT BEDTIME AS NEEDED   Blood Glucose Calibration (ACCU-CHEK GUIDE CONTROL) LIQD Use with blood glucose monitor as directed   Blood Glucose Monitoring Suppl (ACCU-CHEK GUIDE) w/Device KIT Use to check blood sugars as directed   celecoxib (CELEBREX) 200 MG capsule Take 1 capsule by mouth 2 (two) times daily.   cetirizine (ZYRTEC) 10 MG tablet TAKE 1 TABLET BY MOUTH AT BEDTIME   docusate sodium (COLACE) 100 MG capsule Take 100 mg by mouth daily as needed for mild constipation.   ELIQUIS 5 MG TABS tablet TAKE 1 TABLET BY MOUTH TWICE A DAY   FARXIGA 10 MG TABS tablet TAKE 1 TABLET BY MOUTH DAILY   fluticasone furoate-vilanterol (BREO ELLIPTA) 100-25 MCG/INH AEPB INHALE 1 PUFF BY MOUTH INTO THE LUNGS DAILY.   glucose blood (ACCU-CHEK GUIDE) test strip Use as instructed to check sugar daily for type 2 diabetes.   isosorbide mononitrate (IMDUR) 120 MG 24 hr tablet TAKE 1 TABLET BY MOUTH DAILY   isosorbide mononitrate (IMDUR) 60 MG 24 hr tablet Take by mouth.   linaclotide (  LINZESS) 72 MCG capsule Take 72 mcg by mouth daily before breakfast.    lisinopril (ZESTRIL) 10 MG tablet TAKE 1 TABLET BY MOUTH DAILY   magnesium oxide (MAG-OX) 400 MG tablet Take 1 tablet by mouth daily.   metFORMIN (GLUCOPHAGE-XR) 500 MG 24 hr tablet TAKE 1 TABLET BY MOUTH TWICE A DAY   methocarbamol (ROBAXIN) 500 MG tablet TAKE 1 TABLET BY MOUTH EVERY 8 HOURS AS NEEDED FOR MUSCLE SPASMS   naloxone (NARCAN) nasal spray 4 mg/0.1 mL 1  spray into nostril x1 and may repeat every 2-3 minutes until patient is responsive or EMS arrives   nitroGLYCERIN (NITROSTAT) 0.4 MG SL tablet PLACE ONE TABLET UNDER THE TONGUE EVERY 5 MINUTES FOR CHEST PAIN. IF NO RELIEF PAST 3RD TAB GO TO EMERGENCY ROOM   omega-3 acid ethyl esters (LOVAZA) 1 g capsule TAKE 4 CAPSULES BY MOUTH DAILY   omeprazole (PRILOSEC) 40 MG capsule TAKE 1 CAPSULE BY MOUTH 2 TIMES DAILY 30 MINUTES BEFORE BREAKFAST AND DINNER   ondansetron (ZOFRAN-ODT) 4 MG disintegrating tablet Take 1 tablet (4 mg total) by mouth every 8 (eight) hours as needed for nausea or vomiting.   oxyCODONE-acetaminophen (PERCOCET) 10-325 MG tablet TAKE 1 TABLET BY MOUTH EVERY 4 HOURS AS NEEDED FOR PAIN   ranolazine (RANEXA) 1000 MG SR tablet Take 1 tablet (1,000 mg total) by mouth 2 (two) times daily.   SPIRIVA HANDIHALER 18 MCG inhalation capsule PLACE 1 CAPSULE INTO INHALER AND INHALE BY MOUTH ONCE A DAY.   torsemide (DEMADEX) 100 MG tablet Take 0.5 tablets (50 mg total) by mouth daily.   traZODone (DESYREL) 150 MG tablet TAKE 1 TABLET BY MOUTH AT BEDTIME   venlafaxine XR (EFFEXOR-XR) 75 MG 24 hr capsule TAKE 1 CAPSULE BY MOUTH DAILY WITH BREAKFAST   No facility-administered medications prior to visit.    Review of Systems  Constitutional:  Negative for appetite change, chills and fever.  Respiratory:  Positive for shortness of breath. Negative for chest tightness.   Cardiovascular:  Positive for chest pain, palpitations and leg swelling.  Gastrointestinal:  Negative for abdominal pain, diarrhea, nausea and vomiting.  Musculoskeletal:  Positive for arthralgias and myalgias.  Skin:  Positive for color change.  Neurological:  Positive for weakness and numbness.     Objective    BP 120/75 (BP Location: Left Arm, Patient Position: Sitting, Cuff Size: Large)   Pulse (!) 119   Temp 98 F (36.7 C) (Oral)   Resp 16   Wt 197 lb (89.4 kg)   SpO2 97%   BMI 30.85 kg/m  BP Readings from Last 3  Encounters:  06/04/22 120/75  04/03/22 (!) 156/102  05/02/21 (!) 141/92   Wt Readings from Last 3 Encounters:  06/04/22 197 lb (89.4 kg)  04/03/22 194 lb (88 kg)  05/01/21 214 lb 1.1 oz (97.1 kg)      Physical Exam Vitals and nursing note reviewed.  Constitutional:      Appearance: Normal appearance. He is normal weight.  HENT:     Head: Normocephalic and atraumatic.  Eyes:     Pupils: Pupils are equal, round, and reactive to light.  Cardiovascular:     Rate and Rhythm: Normal rate and regular rhythm.     Pulses: Normal pulses.     Heart sounds: Normal heart sounds.  Pulmonary:     Effort: Pulmonary effort is normal.     Breath sounds: Normal breath sounds.  Musculoskeletal:     Right shoulder: Tenderness and crepitus present.  Decreased range of motion. Decreased strength.     Cervical back: Normal range of motion.       Feet:  Feet:     Left foot:     Skin integrity: Erythema present.  Skin:    General: Skin is warm and dry.     Capillary Refill: Capillary refill takes less than 2 seconds.  Neurological:     General: No focal deficit present.     Mental Status: He is alert and oriented to person, place, and time. Mental status is at baseline.     No results found for any visits on 06/04/22.  Assessment & Plan     Problem List Items Addressed This Visit       Other   Acute pain of right shoulder    Acute x1 month Crepitus noted with internal rotation Decreased ROM with posterior placement Referral to ortho to assist Imaging completed d/t previous fall       Relevant Orders   Ambulatory referral to Orthopedic Surgery   DG Shoulder Right   Toe pain, left - Primary    Acute, worsening x1 month Normal exam outside of erythema Encourage referral to podiatry Encourage frequent checks; well fitting shoes, avoiding bare feet, prevent further injury       Relevant Orders   Ambulatory referral to Podiatry     Return if symptoms worsen or fail to  improve.      Vonna Kotyk, FNP, have reviewed all documentation for this visit. The documentation on 06/04/22 for the exam, diagnosis, procedures, and orders are all accurate and complete.    Gwyneth Sprout, [place] [phone] (phone) [phone] (fax)  [place]

## 2022-06-04 NOTE — Assessment & Plan Note (Signed)
Acute x1 month Crepitus noted with internal rotation Decreased ROM with posterior placement Referral to ortho to assist Imaging completed d/t previous fall

## 2022-06-04 NOTE — Assessment & Plan Note (Signed)
Acute, worsening x1 month Normal exam outside of erythema Encourage referral to podiatry Encourage frequent checks; well fitting shoes, avoiding bare feet, prevent further injury

## 2022-06-05 ENCOUNTER — Emergency Department: Payer: Medicare HMO

## 2022-06-05 ENCOUNTER — Other Ambulatory Visit: Payer: Self-pay

## 2022-06-05 DIAGNOSIS — I25118 Atherosclerotic heart disease of native coronary artery with other forms of angina pectoris: Secondary | ICD-10-CM | POA: Insufficient documentation

## 2022-06-05 DIAGNOSIS — R079 Chest pain, unspecified: Secondary | ICD-10-CM | POA: Diagnosis present

## 2022-06-05 DIAGNOSIS — R7989 Other specified abnormal findings of blood chemistry: Secondary | ICD-10-CM | POA: Diagnosis not present

## 2022-06-05 DIAGNOSIS — I480 Paroxysmal atrial fibrillation: Secondary | ICD-10-CM | POA: Insufficient documentation

## 2022-06-05 DIAGNOSIS — R072 Precordial pain: Secondary | ICD-10-CM | POA: Diagnosis not present

## 2022-06-05 DIAGNOSIS — I209 Angina pectoris, unspecified: Secondary | ICD-10-CM | POA: Diagnosis not present

## 2022-06-05 LAB — CBC
HCT: 54.2 % — ABNORMAL HIGH (ref 39.0–52.0)
Hemoglobin: 17.7 g/dL — ABNORMAL HIGH (ref 13.0–17.0)
MCH: 29.8 pg (ref 26.0–34.0)
MCHC: 32.7 g/dL (ref 30.0–36.0)
MCV: 91.4 fL (ref 80.0–100.0)
Platelets: 195 10*3/uL (ref 150–400)
RBC: 5.93 MIL/uL — ABNORMAL HIGH (ref 4.22–5.81)
RDW: 14.6 % (ref 11.5–15.5)
WBC: 8.1 10*3/uL (ref 4.0–10.5)
nRBC: 0 % (ref 0.0–0.2)

## 2022-06-05 LAB — BASIC METABOLIC PANEL
Anion gap: 5 (ref 5–15)
BUN: 19 mg/dL (ref 8–23)
CO2: 30 mmol/L (ref 22–32)
Calcium: 9.7 mg/dL (ref 8.9–10.3)
Chloride: 104 mmol/L (ref 98–111)
Creatinine, Ser: 0.99 mg/dL (ref 0.61–1.24)
GFR, Estimated: >60 mL/min (ref >60)
Glucose, Bld: 202 mg/dL — ABNORMAL HIGH (ref 70–99)
Potassium: 4 mmol/L (ref 3.5–5.1)
Sodium: 139 mmol/L (ref 135–145)

## 2022-06-05 LAB — TROPONIN I (HIGH SENSITIVITY)
Troponin I (High Sensitivity): 18 ng/L — ABNORMAL HIGH (ref <18)
Troponin I (High Sensitivity): 22 ng/L — ABNORMAL HIGH (ref <18)

## 2022-06-05 NOTE — Progress Notes (Signed)
No acute fracture or dislocation. Connect with orthopedics for further management.  Gwyneth Sprout, [address] [place], [place] [address] [phone] (phone) [phone] (fax) [place]

## 2022-06-05 NOTE — ED Provider Triage Note (Signed)
Emergency Medicine Provider Triage Evaluation Note  Lawrence [name] , a 66 y.o. male  was evaluated in triage.  Pt complains of central, upper chest pain that does not radiate.  No associated shortness of breath or chest tightness.  Patient is supposed to start amiodarone for his A-fib.  Patient feels like he is in A-fib now.  Review of Systems  Positive: Patient has chest pain  Negative: No vomiting or abdominal pain.   Physical Exam  There were no vitals taken for this visit. Gen:   Awake, no distress   Resp:  Normal effort  MSK:   Moves extremities without difficulty  Other:    Medical Decision Making  Medically screening exam initiated at 6:31 PM.  Appropriate orders placed.  Lawrence [name] was informed that the remainder of the evaluation will be completed by another provider, this initial triage assessment does not replace that evaluation, and the importance of remaining in the ED until their evaluation is complete.     V[place] [address], [place] 06/05/22 [phone]

## 2022-06-05 NOTE — ED Triage Notes (Signed)
Pt presents to ED with c/o of CP, pt states he seen Dr. Clayborn Bigness yesterday where Dr. Clayborn Bigness changed some of his medications. Pt states CP is mid-sternal and no radiation noted. Pt is A&Ox4.

## 2022-06-06 ENCOUNTER — Emergency Department
Admission: EM | Admit: 2022-06-06 | Discharge: 2022-06-06 | Disposition: A | Discharge status: Home / Self Care | Payer: Medicare HMO | Attending: Emergency Medicine | Admitting: Emergency Medicine

## 2022-06-06 DIAGNOSIS — I208 Other forms of angina pectoris: Secondary | ICD-10-CM

## 2022-06-06 NOTE — ED Provider Notes (Signed)
[facility] Provider Note    Event Date/Time   First MD Initiated Contact with Patient 06/06/22 0221     (approximate)   History   Chest Pain   HPI  Lawrence [name] is a 66 y.o. male with extensive chronic medical history including known coronary artery disease and prior PCI, as well as paroxysmal A-fib on Eliquis.  He is a patient of Dr. Clayborn Bigness.  He presents tonight with episodes of chest pain.  It feels similar to prior and is generally beneath his sternum but does not radiate.  It is worse than usual and he was little bit concerned because he just saw Dr. Clayborn Bigness 1 to 2 days ago and Dr. Clayborn Bigness made some medication changes.  He wanted to make sure he was okay.  Otherwise he is generally well.  He has nitroglycerin to take for pain and it helps a little bit.  He said that he was taken off of the isosorbide he used to take.  Otherwise he is very compliant with his medications.  He denies shortness of breath, fever, nausea, vomiting, and abdominal pain.     Physical Exam   Triage Vital Signs: ED Triage Vitals  Enc Vitals Group     BP 06/05/22 1831 (!) 178/100     Pulse Rate 06/05/22 1831 82     Resp 06/05/22 1831 17     Temp 06/05/22 1831 98 F (36.7 C)     Temp Source 06/05/22 1831 Oral     SpO2 06/05/22 1831 96 %     Weight --      Height --      Head Circumference --      Peak Flow --      Pain Score 06/05/22 1832 9     Pain Loc --      Pain Edu? --      Excl. in [place]? --     Most recent vital signs: Vitals:   06/05/22 1831 06/05/22 2222  BP: (!) 178/100 (!) 179/117  Pulse: 82 81  Resp: 17 16  Temp: 98 F (36.7 C) 98.2 F (36.8 C)  SpO2: 96% 95%     General: Awake, no distress.  CV:  Good peripheral perfusion.  Normal heart sounds.  Irregular rhythm but normal rate. Resp:  Normal effort.  Lungs are clear to auscultation. Abd:  No distention.  No tenderness to palpation of the abdomen. Other:  No focal neurological deficits,  good mentation, awake and alert, good spirits.   ED Results / Procedures / Treatments   Labs (all labs ordered are listed, but only abnormal results are displayed) Labs Reviewed  BASIC METABOLIC PANEL - Abnormal; Notable for the following components:      Result Value   Glucose, Bld 202 (*)    All other components within normal limits  CBC - Abnormal; Notable for the following components:   RBC 5.93 (*)    Hemoglobin 17.7 (*)    HCT 54.2 (*)    All other components within normal limits  TROPONIN I (HIGH SENSITIVITY) - Abnormal; Notable for the following components:   Troponin I (High Sensitivity) 18 (*)    All other components within normal limits  TROPONIN I (HIGH SENSITIVITY) - Abnormal; Notable for the following components:   Troponin I (High Sensitivity) 22 (*)    All other components within normal limits     EKG  ED ECG REPORT I, Hinda Kehr, the attending physician, personally viewed  and interpreted this ECG.  Date: 06/05/2022 EKG Time: 18: 30 Rate: 93 Rhythm: Atrial fibrillation QRS Axis: Rightward axis Intervals: normal ST/T Wave abnormalities: Non-specific ST segment / T-wave changes, but no clear evidence of acute ischemia. Narrative Interpretation: no definitive evidence of acute ischemia; does not meet STEMI criteria.    RADIOLOGY I viewed and interpreted the patient's two-view chest x-ray.  I see no evidence of interstitial edema, pneumonia, or other acute abnormality.  Radiology report agrees that there are no acute findings.    PROCEDURES:  Critical Care performed: No  Procedures   MEDICATIONS ORDERED IN ED: Medications - No data to display   IMPRESSION / MDM / [place] / ED COURSE  I reviewed the triage vital signs and the nursing notes.                              Differential diagnosis includes, but is not limited to, stable angina, ACS including unstable angina, PE, pneumonia.  Patient's presentation is most consistent  with acute presentation with potential threat to life or bodily function.  The patient certainly has numerous risk factors for ACS.  However his work-up tonight is reassuring.  His blood pressure is elevated but he has not had his medication in the 8-1/2 hours he has been waiting in the emergency department.  Labs/studies ordered include: 2 view chest x-ray, EKG, high-sensitivity troponin x2, CBC, basic metabolic panel.  EKG is consistent with A-fib, unknown condition for him, and he is on Eliquis, but there is no indication of ischemia.  As described above, two-view chest x-ray is clear.  Lab work results are all reassuring.  His high-sensitivity troponin is very slightly elevated at 18 and 22, but without a significant change between the 2 and consistent with prior values.  I considered hospitalization given his known coronary artery disease, but I reviewed the cardiology clinic note by Dr. Clayborn Bigness from 06/04/2022, and read that he is aware the patient has been having stable angina.  Dr. Clayborn Bigness is following him closely and I do not believe the patient would benefit from hospitalization.  The patient understands and agrees with the plan for discharge and close follow-up in clinic.  I gave my usual and customary return precautions.        FINAL CLINICAL IMPRESSION(S) / ED DIAGNOSES   Final diagnoses:  Stable angina ([place])     Rx / DC Orders   ED Discharge Orders     None        Note:  This document was prepared using Dragon voice recognition software and may include unintentional dictation errors.   Hinda Kehr, MD 06/06/22 [phone]

## 2022-06-06 NOTE — ED Notes (Signed)
E signature pad not working. Pt educated on discharge instructions and verbalized understanding.  

## 2022-06-06 NOTE — Discharge Instructions (Addendum)
As we discussed, your evaluation was reassuring today.  There is no indication you are having a heart attack or need to change to your medications.  When you get home, please take your regular medications including your blood pressure pills.  Continue to take your medications over the weekend and follow-up with Dr. Clayborn Bigness on Monday.  Return to the emergency department if you develop new or worsening symptoms that concern you.

## 2022-06-12 DIAGNOSIS — I5032 Chronic diastolic (congestive) heart failure: Secondary | ICD-10-CM | POA: Diagnosis not present

## 2022-06-12 DIAGNOSIS — I214 Non-ST elevation (NSTEMI) myocardial infarction: Secondary | ICD-10-CM | POA: Diagnosis not present

## 2022-06-15 ENCOUNTER — Other Ambulatory Visit: Payer: Self-pay | Admitting: Family Medicine

## 2022-06-15 DIAGNOSIS — F5101 Primary insomnia: Secondary | ICD-10-CM

## 2022-06-16 ENCOUNTER — Other Ambulatory Visit: Payer: Self-pay | Admitting: Family Medicine

## 2022-06-16 ENCOUNTER — Ambulatory Visit: Payer: Medicare HMO | Admitting: Podiatry

## 2022-06-16 DIAGNOSIS — M5410 Radiculopathy, site unspecified: Secondary | ICD-10-CM

## 2022-06-16 NOTE — Telephone Encounter (Signed)
Requested medication (s) are due for refill today: yes  Requested medication (s) are on the active medication list: yes  Last refill:  05/12/22 #30/0  Future visit scheduled: yes  Notes to clinic:  Unable to refill per protocol, medication not assigned to the refill protocol.    Requested Prescriptions  Pending Prescriptions Disp Refills   BELSOMRA 5 MG TABS [Pharmacy Med Name: BELSOMRA 5 MG TAB] 30 tablet     Sig: TAKE 1 TABLET (5 MG TOTAL) BY MOUTH AT BEDTIME AS NEEDED     Off-Protocol Failed - 06/15/2022  1:14 PM      Failed - Medication not assigned to a protocol, review manually.      Passed - Valid encounter within last 12 months    Recent Outpatient Visits           1 week ago Toe pain, left   [facility] Tally Joe T, FNP   2 months ago Type 2 diabetes mellitus with diabetic nephropathy, without long-term current use of insulin [facility])   [facility] Birdie Sons, MD   5 months ago OSA (obstructive sleep apnea)   [place], DO   1 year ago Type 2 diabetes mellitus with diabetic nephropathy, without long-term current use of insulin [facility])   [facility] Birdie Sons, MD   1 year ago Type 2 diabetes mellitus with diabetic nephropathy, without long-term current use of insulin [facility])   [facility] Birdie Sons, MD       Future Appointments             In 1 month [name], [name], MD [facility], [place]

## 2022-06-17 ENCOUNTER — Other Ambulatory Visit: Payer: Self-pay | Admitting: Family Medicine

## 2022-06-17 DIAGNOSIS — F419 Anxiety disorder, unspecified: Secondary | ICD-10-CM

## 2022-06-17 NOTE — Telephone Encounter (Signed)
Requested medication (s) are due for refill today - yes  Requested medication (s) are on the active medication list -yes  Future visit scheduled -yes  Last refill: 05/14/22 #60  Notes to clinic: non delegated Rx  Requested Prescriptions  Pending Prescriptions Disp Refills   oxyCODONE-acetaminophen (PERCOCET) 10-325 MG tablet [Pharmacy Med Name: OXYCODONE-APAP 10-325 MG TAB] 180 tablet     Sig: TAKE 1 TABLET BY MOUTH EVERY 4 HOURS AS NEEDED FOR PAIN     Not Delegated - Analgesics:  Opioid Agonist Combinations Failed - 06/17/2022  8:54 AM      Failed - This refill cannot be delegated      Failed - Urine Drug Screen completed in last 360 days      Passed - Valid encounter within last 3 months    Recent Outpatient Visits           1 week ago Toe pain, left   [facility] Tally Joe T, FNP   2 months ago Type 2 diabetes mellitus with diabetic nephropathy, without long-term current use of insulin ([place])   [facility] Birdie Sons, MD   5 months ago OSA (obstructive sleep apnea)   [facility] Myles Gip, DO   1 year ago Type 2 diabetes mellitus with diabetic nephropathy, without long-term current use of insulin ([place])   [facility] Birdie Sons, MD   1 year ago Type 2 diabetes mellitus with diabetic nephropathy, without long-term current use of insulin ([place])   [place], [name], MD       Future Appointments             In 1 month [name], [name], MD [facility], [place]               Requested Prescriptions  Pending Prescriptions Disp Refills   oxyCODONE-acetaminophen (PERCOCET) 10-325 MG tablet [Pharmacy Med Name: OXYCODONE-APAP 10-325 MG TAB] 180 tablet     Sig: TAKE 1 TABLET BY MOUTH EVERY 4 HOURS AS NEEDED FOR PAIN     Not Delegated - Analgesics:  Opioid Agonist Combinations Failed - 06/17/2022  8:54 AM      Failed - This refill cannot be delegated       Failed - Urine Drug Screen completed in last 360 days      Passed - Valid encounter within last 3 months    Recent Outpatient Visits           1 week ago Toe pain, left   [facility] Tally Joe T, FNP   2 months ago Type 2 diabetes mellitus with diabetic nephropathy, without long-term current use of insulin [facility])   [facility] Birdie Sons, MD   5 months ago OSA (obstructive sleep apnea)   [place], Jake Church, DO   1 year ago Type 2 diabetes mellitus with diabetic nephropathy, without long-term current use of insulin ([place])   [facility] Birdie Sons, MD   1 year ago Type 2 diabetes mellitus with diabetic nephropathy, without long-term current use of insulin [facility])   [facility] Birdie Sons, MD       Future Appointments             In 1 month [name], [name], MD [facility], [place]

## 2022-06-17 NOTE — Telephone Encounter (Signed)
Requested medication (s) are due for refill today - yes  Requested medication (s) are on the active medication list -yes  Future visit scheduled -yes  Last refill: 12/25/21 #90 1RF  Notes to clinic: non delegated Rx  Requested Prescriptions  Pending Prescriptions Disp Refills   ALPRAZolam (XANAX) 1 MG tablet 90 tablet 3    Sig: Take 1 tablet (1 mg total) by mouth 3 (three) times daily.     Not Delegated - Psychiatry: Anxiolytics/Hypnotics 2 Failed - 06/17/2022  9:56 AM      Failed - This refill cannot be delegated      Failed - Urine Drug Screen completed in last 360 days      Passed - Patient is not pregnant      Passed - Valid encounter within last 6 months    Recent Outpatient Visits           1 week ago Toe pain, left   [facility] Tally Joe T, FNP   2 months ago Type 2 diabetes mellitus with diabetic nephropathy, without long-term current use of insulin [facility])   [facility] Birdie Sons, MD   5 months ago OSA (obstructive sleep apnea)   [facility], Jake Church, DO   1 year ago Type 2 diabetes mellitus with diabetic nephropathy, without long-term current use of insulin ([place])   [facility] Birdie Sons, MD   1 year ago Type 2 diabetes mellitus with diabetic nephropathy, without long-term current use of insulin [facility])   [facility] Birdie Sons, MD       Future Appointments             In 1 month [name], [name], MD [name]               Requested Prescriptions  Pending Prescriptions Disp Refills   ALPRAZolam (XANAX) 1 MG tablet 90 tablet 3    Sig: Take 1 tablet (1 mg total) by mouth 3 (three) times daily.     Not Delegated - Psychiatry: Anxiolytics/Hypnotics 2 Failed - 06/17/2022  9:56 AM      Failed - This refill cannot be delegated      Failed - Urine Drug Screen completed in last 360 days      Passed - Patient is not pregnant       Passed - Valid encounter within last 6 months    Recent Outpatient Visits           1 week ago Toe pain, left   [facility] Tally Joe T, FNP   2 months ago Type 2 diabetes mellitus with diabetic nephropathy, without long-term current use of insulin [facility])   [facility] Birdie Sons, MD   5 months ago OSA (obstructive sleep apnea)   [place], DO   1 year ago Type 2 diabetes mellitus with diabetic nephropathy, without long-term current use of insulin [facility])   [facility] Birdie Sons, MD   1 year ago Type 2 diabetes mellitus with diabetic nephropathy, without long-term current use of insulin [facility])   [facility] Birdie Sons, MD       Future Appointments             In 1 month [name], [name], MD [facility] [place]

## 2022-06-17 NOTE — Telephone Encounter (Signed)
Requested medication (s) are due for refill today - yes  Requested medication (s) are on the active medication list -yes  Future visit scheduled -yes  Last refill: 05/14/22 #180  Notes to clinic: Duplicate request- non delegated Rx  Requested Prescriptions  Pending Prescriptions Disp Refills   oxyCODONE-acetaminophen (PERCOCET) 10-325 MG tablet [Pharmacy Med Name: KGYJEHUDJ-SHFW 10-325 MG TAB] 180 tablet     Sig: TAKE 1 TABLET BY MOUTH EVERY 4 HOURS AS NEEDED FOR PAIN     Not Delegated - Analgesics:  Opioid Agonist Combinations Failed - 06/17/2022  8:54 AM      Failed - This refill cannot be delegated      Failed - Urine Drug Screen completed in last 360 days      Passed - Valid encounter within last 3 months    Recent Outpatient Visits           1 week ago Toe pain, left   [facility] Tally Joe T, FNP   2 months ago Type 2 diabetes mellitus with diabetic nephropathy, without long-term current use of insulin ([place])   [facility] Birdie Sons, MD   5 months ago OSA (obstructive sleep apnea)   [facility] Myles Gip, DO   1 year ago Type 2 diabetes mellitus with diabetic nephropathy, without long-term current use of insulin ([place])   [facility] Birdie Sons, MD   1 year ago Type 2 diabetes mellitus with diabetic nephropathy, without long-term current use of insulin ([place])   [place], [name], MD       Future Appointments             In 1 month [name], [name], MD [facility] [place]               Requested Prescriptions  Pending Prescriptions Disp Refills   oxyCODONE-acetaminophen (PERCOCET) 10-325 MG tablet [Pharmacy Med Name: OXYCODONE-APAP 10-325 MG TAB] 180 tablet     Sig: TAKE 1 TABLET BY MOUTH EVERY 4 HOURS AS NEEDED FOR PAIN     Not Delegated - Analgesics:  Opioid Agonist Combinations Failed - 06/17/2022  8:54 AM      Failed - This refill cannot  be delegated      Failed - Urine Drug Screen completed in last 360 days      Passed - Valid encounter within last 3 months    Recent Outpatient Visits           1 week ago Toe pain, left   [facility] Tally Joe T, FNP   2 months ago Type 2 diabetes mellitus with diabetic nephropathy, without long-term current use of insulin [facility])   [facility] Birdie Sons, MD   5 months ago OSA (obstructive sleep apnea)   [facility], Jake Church, DO   1 year ago Type 2 diabetes mellitus with diabetic nephropathy, without long-term current use of insulin ([place])   [facility] Birdie Sons, MD   1 year ago Type 2 diabetes mellitus with diabetic nephropathy, without long-term current use of insulin [facility])   [facility] Birdie Sons, MD       Future Appointments             In 1 month [name], [name], MD [facility], [place]

## 2022-06-17 NOTE — Telephone Encounter (Signed)
Pt is calling back to follow up on medication. Pt stated will be out of medication tomorrow.   Please advise.

## 2022-06-17 NOTE — Telephone Encounter (Signed)
Medication Refill - Medication: ALPRAZolam (XANAX) 1 MG tablet  Has the patient contacted their pharmacy? No. Pt stated unsure if he had any refills left.  Requested I send refill request.   (Agent: If no, request that the patient contact the pharmacy for the refill. If patient does not wish to contact the pharmacy document the reason why and proceed with request.)   Preferred Pharmacy (with phone number or street name):  [place], [place]  [address] [place] [address]  Phone: [phone] Fax: [phone]  Hours: Not open 24 hours   Has the patient been seen for an appointment in the last year OR does the patient have an upcoming appointment? Yes.    Agent: Please be advised that RX refills may take up to 3 business days. We ask that you follow-up with your pharmacy.

## 2022-06-18 MED ORDER — ALPRAZOLAM 1 MG PO TABS: 1.0000 mg | ORAL_TABLET | Freq: Three times a day (TID) | ORAL | 3 refills | Status: DC

## 2022-06-22 IMAGING — CR DG CHEST 2V
2 series · 2 of 2 positions shown · non-contrast
Comparison: 03/07/2020

CLINICAL DATA: Chest pain for 2 hours.

EXAM:
CHEST - 2 VIEW

[chest lat]
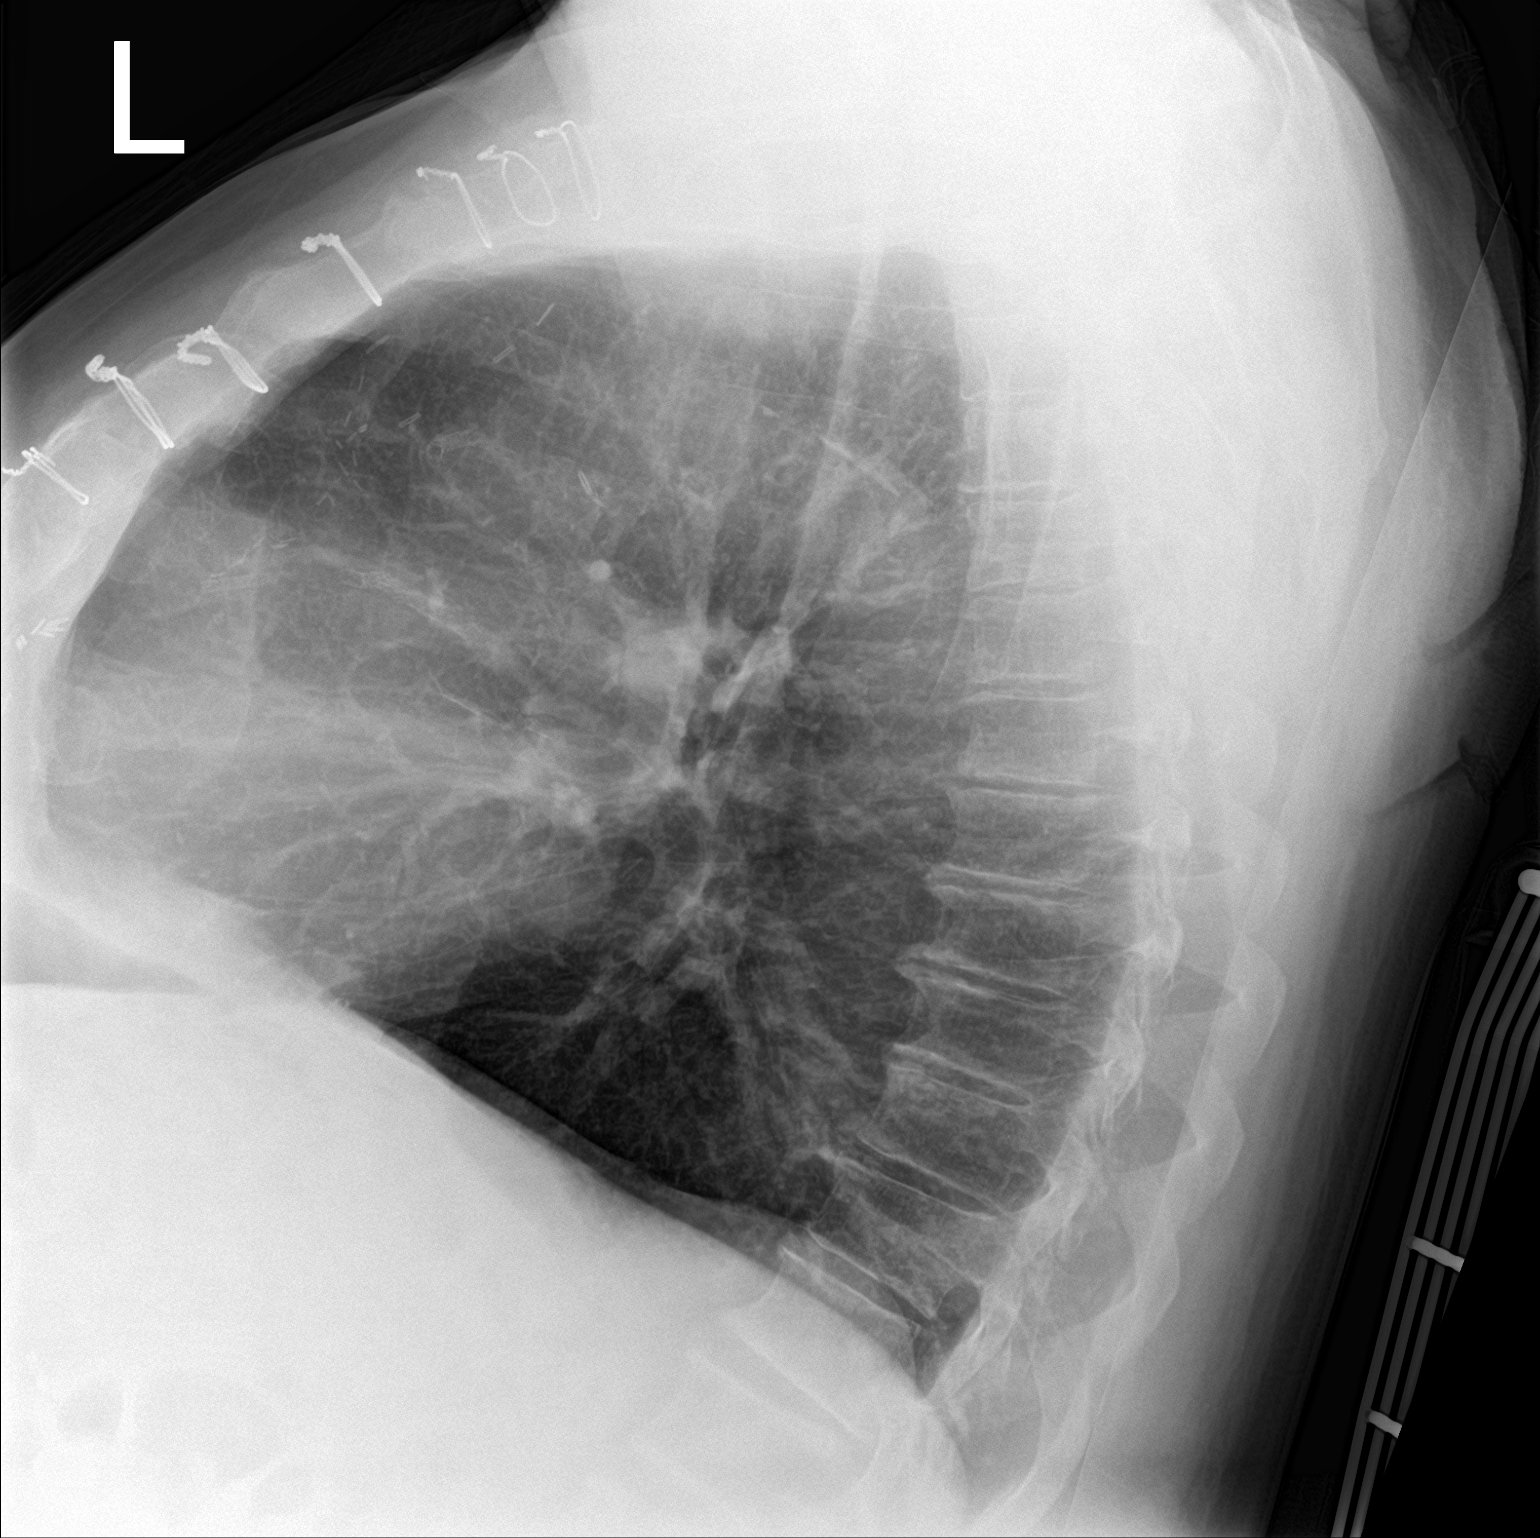

[chest ap]
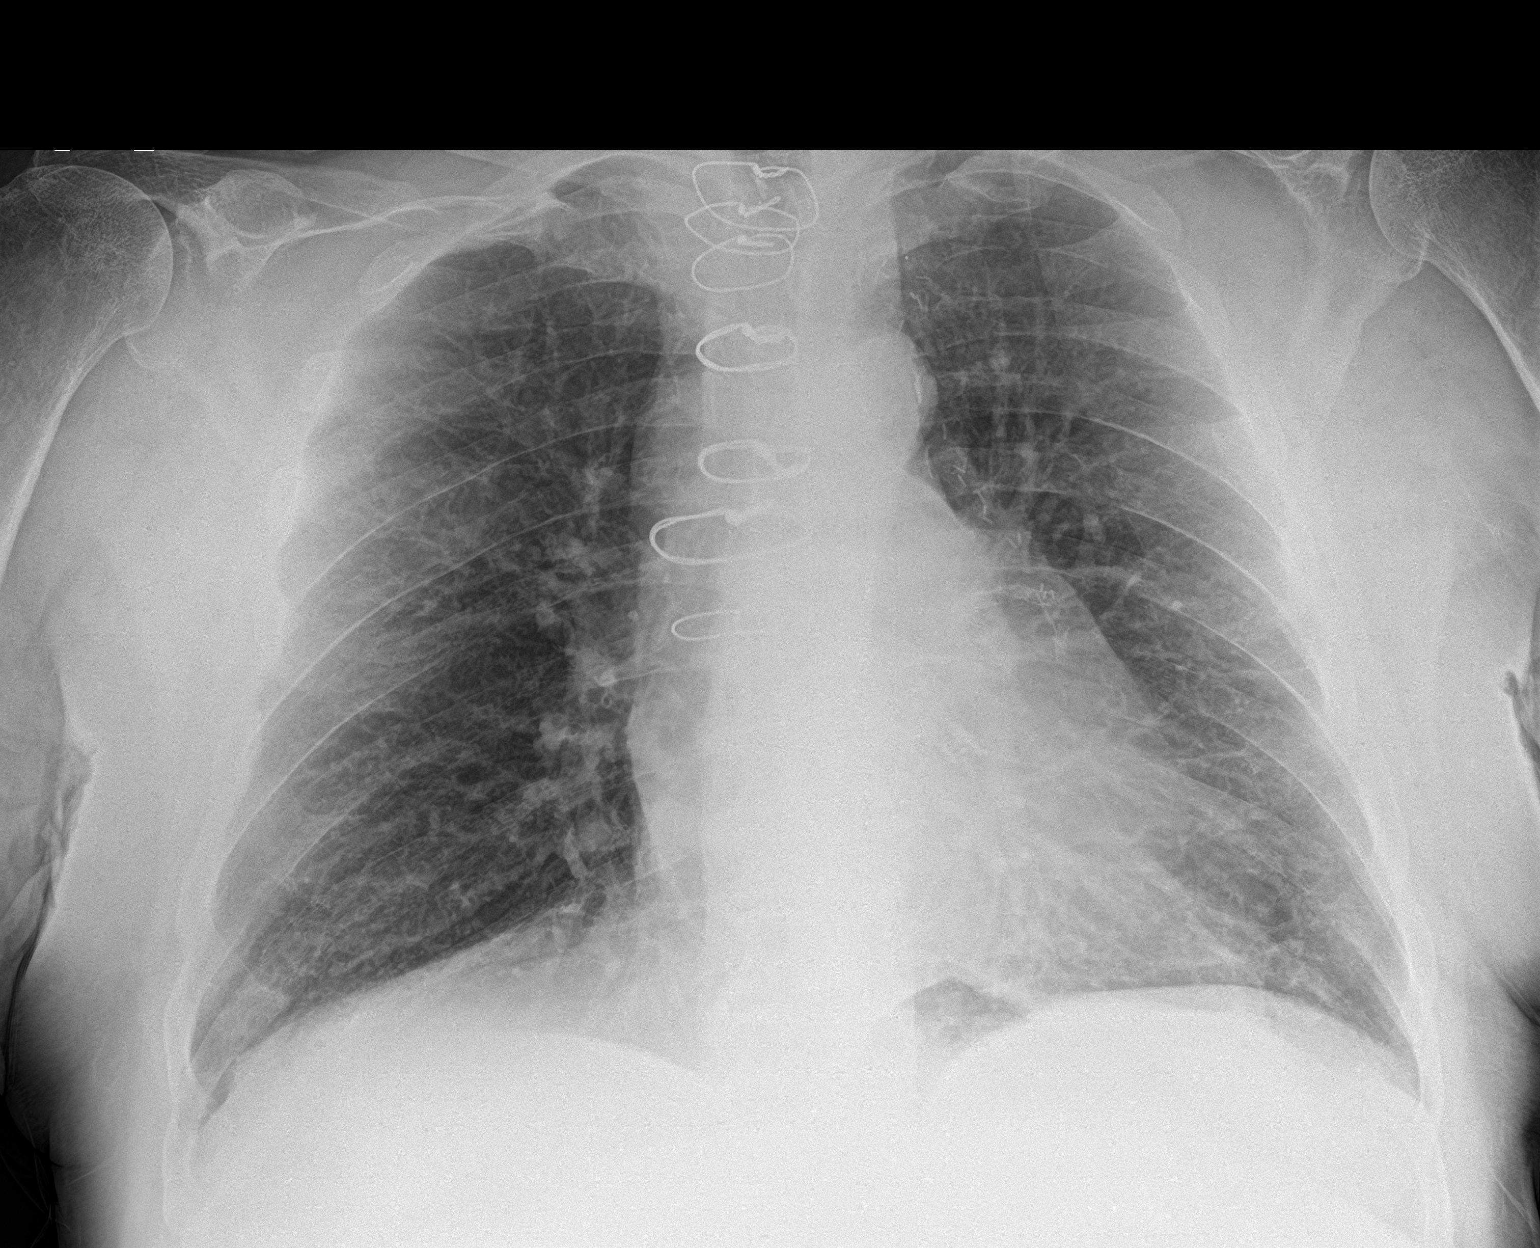

[2 of 2 positions shown; findings below may reference images not displayed]

FINDINGS: Postoperative changes in the mediastinum. Heart size and pulmonary
vascularity are normal. Lungs are clear. No pleural effusions. No
pneumothorax. Mediastinal contours appear intact. Degenerative
changes in the spine.
IMPRESSION: No active cardiopulmonary disease.

## 2022-06-23 IMAGING — CT CT ANGIO CHEST
4 of 7 series · 18 of 46 positions shown · IV contrast (APPLIED)
Comparison: 09/04/2019

CLINICAL DATA: Chest pain since [DATE]. Suspected aortic dissection.

EXAM:
CT ANGIOGRAPHY CHEST WITH CONTRAST
TECHNIQUE: Multidetector CT imaging of the chest was performed using the
standard protocol during bolus administration of intravenous
contrast. Multiplanar CT image reconstructions and MIPs were
obtained to evaluate the vascular anatomy.
CONTRAST:  100mL OMNIPAQUE IOHEXOL 350 MG/ML SOLN

[Series 3: axial pre · axial · non-contrast · 0.76mm/px · z∈[-563,-368]mm · 5 of 59 slices shown]
[im 10/59  lung]
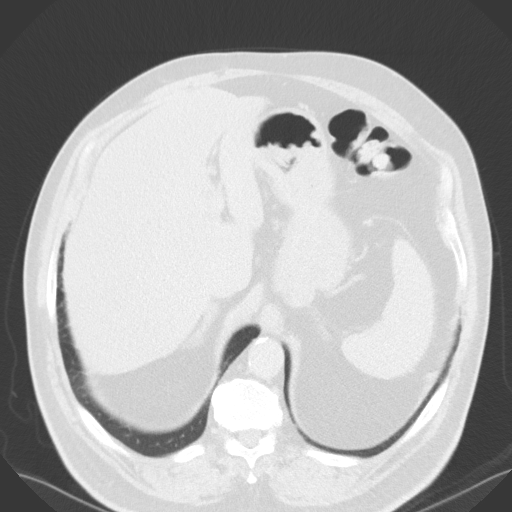
[im 20/59  lung]
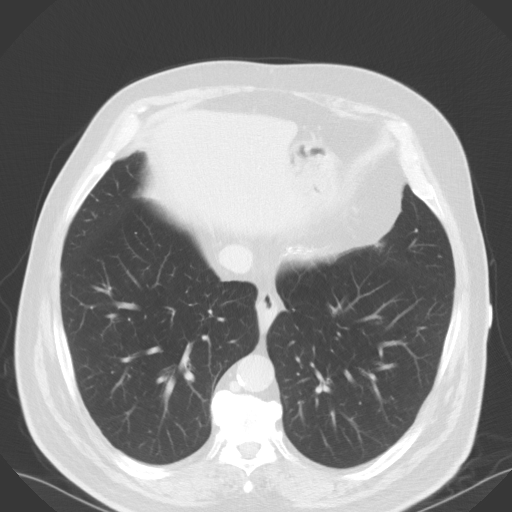
[im 30/59  lung]
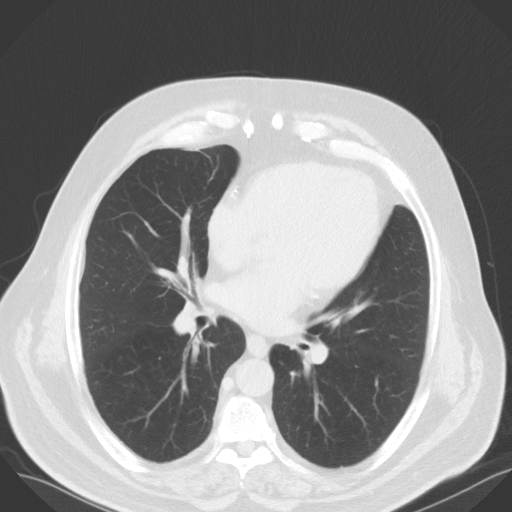
[im 39/59  lung]
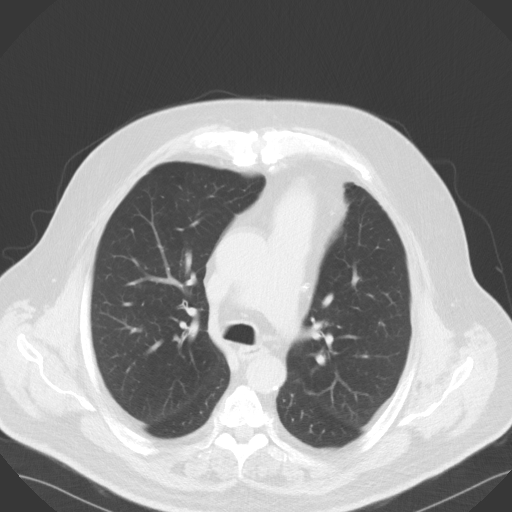
[im 49/59  lung]
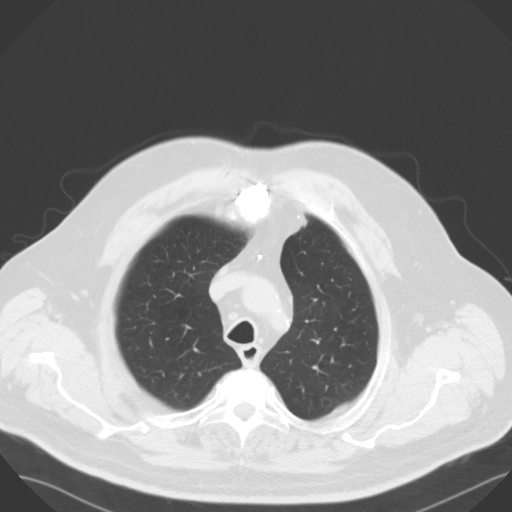

[Series 7: axial arterial · axial · arterial · 0.76mm/px · z∈[-576,-351]mm · 8 of 97 slices shown]
[im 11/97  lung]
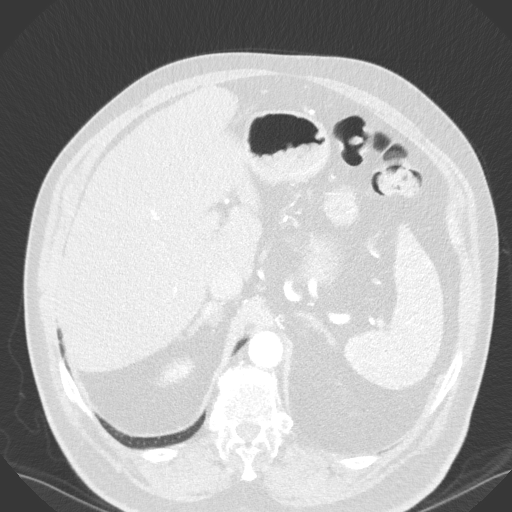
[im 22/97  soft-tissue]
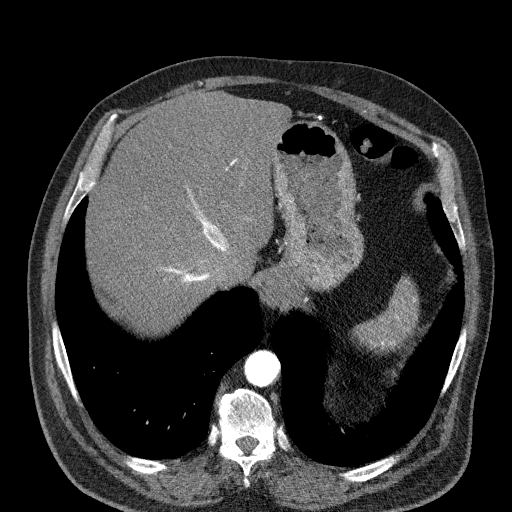
[im 33/97  lung]
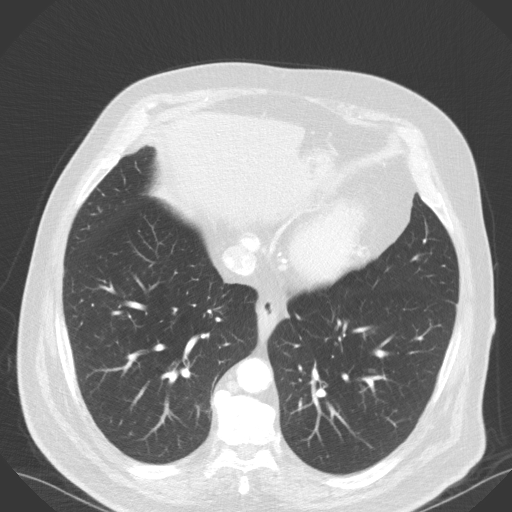
[im 43/97  soft-tissue]
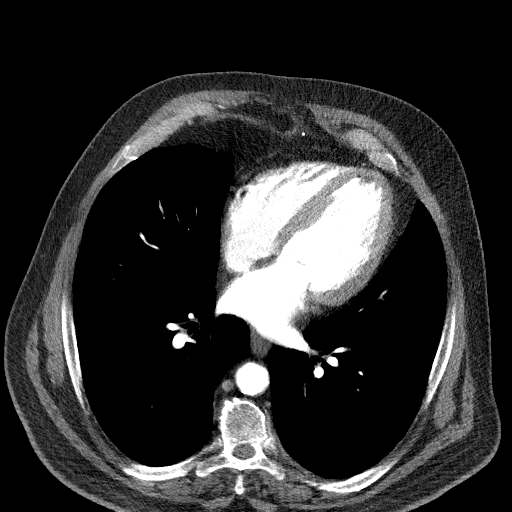
[im 54/97  lung]
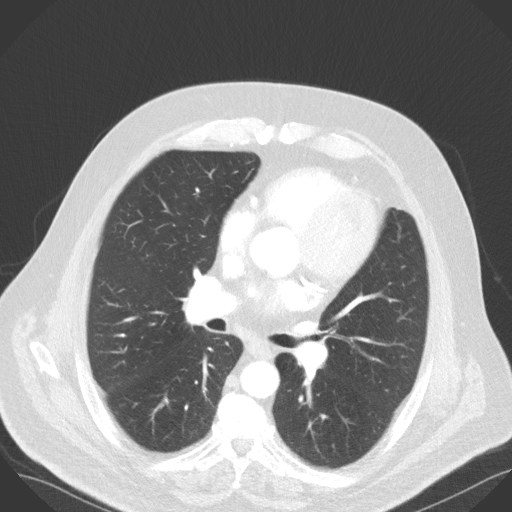
[im 65/97  soft-tissue]
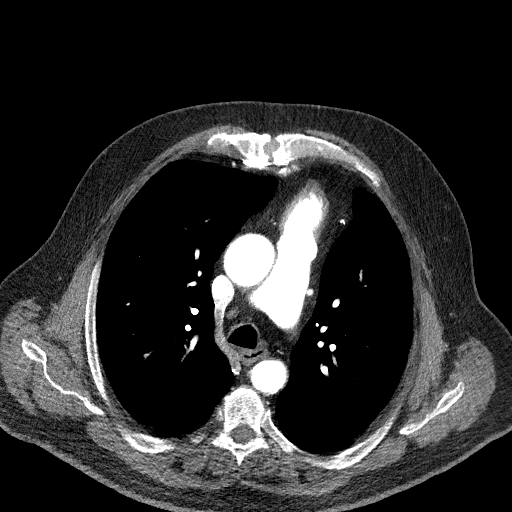
[im 75/97  lung]
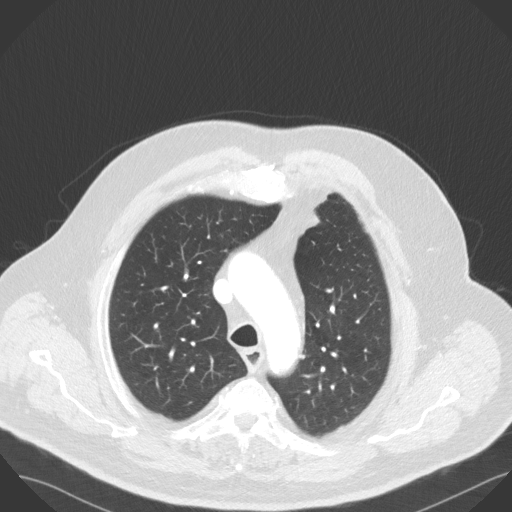
[im 86/97  soft-tissue]
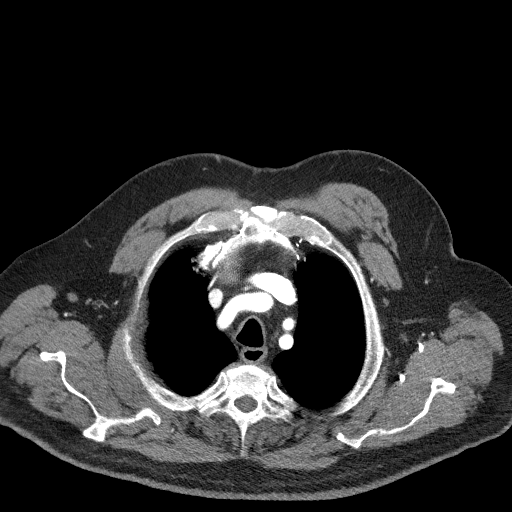

[Series 8: lung · axial · 0.76mm/px · z∈[-588,-546]mm · 2 of 146 slices shown]
[im 11/146  soft-tissue]
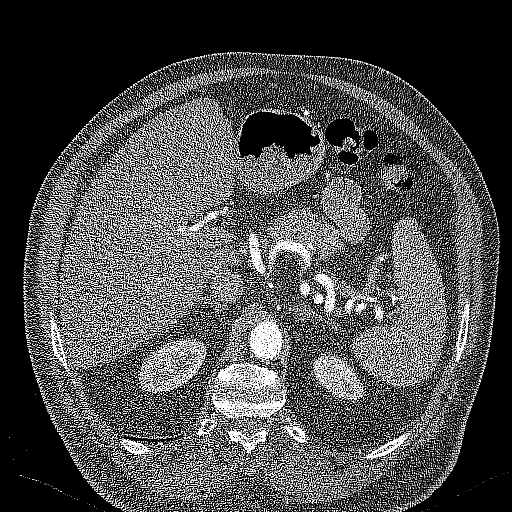
[im 32/146  soft-tissue]
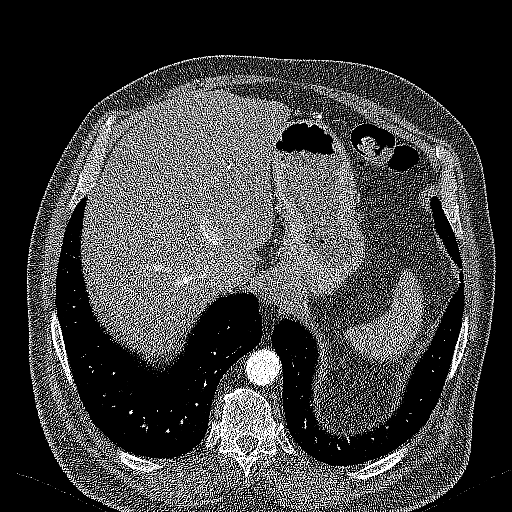

[Series 10: coronals · coronal · 0.58mm/px · 3 of 180 slices shown]
[im 45/180  soft-tissue]
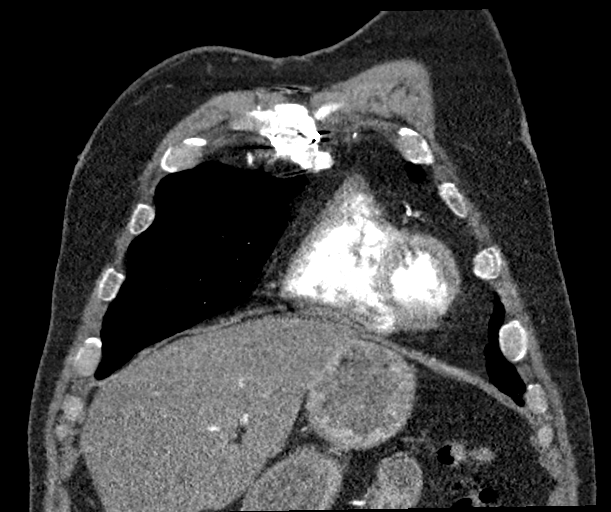
[im 90/180  soft-tissue]
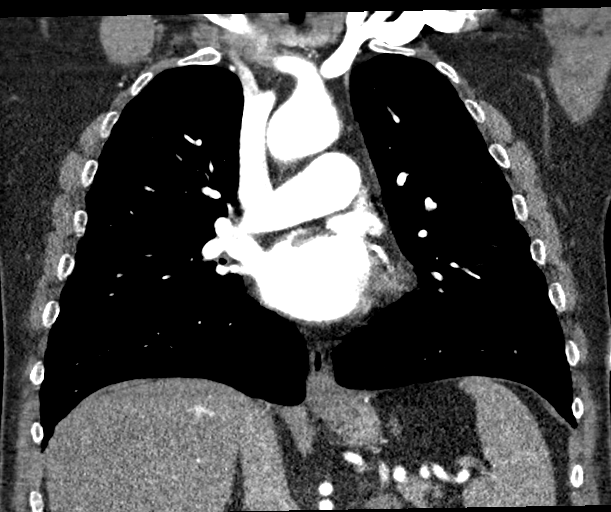
[im 135/180  soft-tissue]
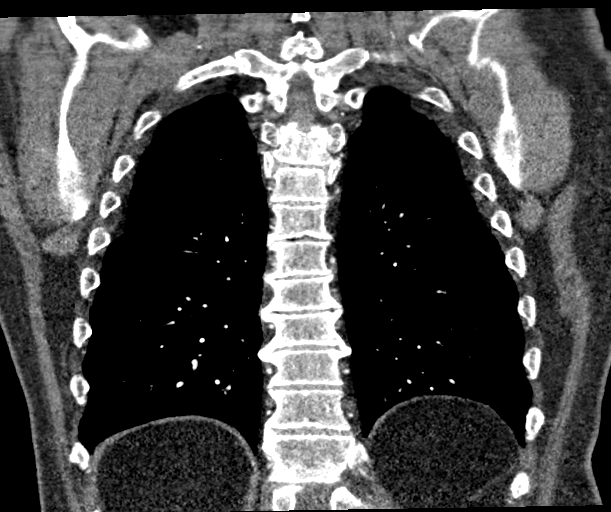

[18 of 46 positions shown; findings below may reference images not displayed]

FINDINGS: Cardiovascular: Unenhanced images demonstrate no evidence of
intramural hematoma. Calcification in the thoracic aorta.
Postoperative changes in the mediastinum. Coronary artery bypass and
stents.

Images obtained after contrast administration in the arterial phase
demonstrate normal caliber patent thoracic aorta. No aneurysm or
dissection. Great vessel origins are patent. Tricuspid aortic valve.
Normal heart size. No pericardial effusions. Pulmonary arteries are
well opacified without evidence of pulmonary embolus.

Mediastinum/Nodes: Esophagus is decompressed. No significant
lymphadenopathy.

Lungs/Pleura: Lungs are clear. No pleural effusions. No
pneumothorax. Airways are patent.

Upper Abdomen: Surgical absence of the gallbladder. No acute process
demonstrated in the visualized upper abdomen.

Musculoskeletal: Degenerative changes in the spine. No destructive
bone lesions. Median sternotomy wires.

Review of the MIP images confirms the above findings.
IMPRESSION: 1. No evidence of aortic aneurysm or dissection.
2. No evidence of pulmonary embolus.
3. No evidence of active pulmonary disease.
4. Aortic atherosclerosis.

Aortic Atherosclerosis ([id number]).

## 2022-06-28 ENCOUNTER — Other Ambulatory Visit: Payer: Self-pay

## 2022-06-28 ENCOUNTER — Encounter: Payer: Self-pay | Admitting: Emergency Medicine

## 2022-06-28 ENCOUNTER — Observation Stay
Admission: EM | Admit: 2022-06-28 | Discharge: 2022-07-01 | Disposition: A | Discharge status: Home Health | Payer: Medicare HMO | Attending: Internal Medicine | Admitting: Internal Medicine

## 2022-06-28 ENCOUNTER — Emergency Department: Payer: Medicare HMO

## 2022-06-28 DIAGNOSIS — R42 Dizziness and giddiness: Secondary | ICD-10-CM | POA: Diagnosis not present

## 2022-06-28 DIAGNOSIS — I10 Essential (primary) hypertension: Secondary | ICD-10-CM | POA: Diagnosis not present

## 2022-06-28 DIAGNOSIS — R072 Precordial pain: Secondary | ICD-10-CM | POA: Diagnosis present

## 2022-06-28 DIAGNOSIS — E876 Hypokalemia: Secondary | ICD-10-CM | POA: Diagnosis not present

## 2022-06-28 DIAGNOSIS — Z79899 Other long term (current) drug therapy: Secondary | ICD-10-CM | POA: Insufficient documentation

## 2022-06-28 DIAGNOSIS — J440 Chronic obstructive pulmonary disease with acute lower respiratory infection: Secondary | ICD-10-CM | POA: Diagnosis not present

## 2022-06-28 DIAGNOSIS — J45909 Unspecified asthma, uncomplicated: Secondary | ICD-10-CM | POA: Insufficient documentation

## 2022-06-28 DIAGNOSIS — Z951 Presence of aortocoronary bypass graft: Secondary | ICD-10-CM | POA: Diagnosis not present

## 2022-06-28 DIAGNOSIS — Z7982 Long term (current) use of aspirin: Secondary | ICD-10-CM | POA: Insufficient documentation

## 2022-06-28 DIAGNOSIS — I480 Paroxysmal atrial fibrillation: Secondary | ICD-10-CM | POA: Diagnosis not present

## 2022-06-28 DIAGNOSIS — R059 Cough, unspecified: Secondary | ICD-10-CM | POA: Insufficient documentation

## 2022-06-28 DIAGNOSIS — E785 Hyperlipidemia, unspecified: Secondary | ICD-10-CM | POA: Diagnosis not present

## 2022-06-28 DIAGNOSIS — J441 Chronic obstructive pulmonary disease with (acute) exacerbation: Secondary | ICD-10-CM | POA: Diagnosis not present

## 2022-06-28 DIAGNOSIS — J209 Acute bronchitis, unspecified: Secondary | ICD-10-CM | POA: Diagnosis not present

## 2022-06-28 DIAGNOSIS — I5022 Chronic systolic (congestive) heart failure: Secondary | ICD-10-CM | POA: Diagnosis present

## 2022-06-28 DIAGNOSIS — R0789 Other chest pain: Secondary | ICD-10-CM

## 2022-06-28 DIAGNOSIS — J9611 Chronic respiratory failure with hypoxia: Secondary | ICD-10-CM | POA: Diagnosis present

## 2022-06-28 DIAGNOSIS — Z87891 Personal history of nicotine dependence: Secondary | ICD-10-CM | POA: Insufficient documentation

## 2022-06-28 DIAGNOSIS — I25798 Atherosclerosis of other coronary artery bypass graft(s) with other forms of angina pectoris: Secondary | ICD-10-CM | POA: Diagnosis not present

## 2022-06-28 DIAGNOSIS — I2510 Atherosclerotic heart disease of native coronary artery without angina pectoris: Secondary | ICD-10-CM | POA: Diagnosis not present

## 2022-06-28 DIAGNOSIS — Z7984 Long term (current) use of oral hypoglycemic drugs: Secondary | ICD-10-CM | POA: Insufficient documentation

## 2022-06-28 DIAGNOSIS — Z7901 Long term (current) use of anticoagulants: Secondary | ICD-10-CM | POA: Insufficient documentation

## 2022-06-28 DIAGNOSIS — I5042 Chronic combined systolic (congestive) and diastolic (congestive) heart failure: Secondary | ICD-10-CM | POA: Diagnosis present

## 2022-06-28 DIAGNOSIS — I5032 Chronic diastolic (congestive) heart failure: Secondary | ICD-10-CM | POA: Insufficient documentation

## 2022-06-28 DIAGNOSIS — Z20822 Contact with and (suspected) exposure to covid-19: Secondary | ICD-10-CM | POA: Insufficient documentation

## 2022-06-28 DIAGNOSIS — R0602 Shortness of breath: Secondary | ICD-10-CM | POA: Diagnosis not present

## 2022-06-28 DIAGNOSIS — R000 Tachycardia, unspecified: Secondary | ICD-10-CM | POA: Diagnosis not present

## 2022-06-28 DIAGNOSIS — R079 Chest pain, unspecified: Secondary | ICD-10-CM | POA: Diagnosis not present

## 2022-06-28 DIAGNOSIS — E119 Type 2 diabetes mellitus without complications: Secondary | ICD-10-CM | POA: Diagnosis not present

## 2022-06-28 DIAGNOSIS — I110 Hypertensive heart disease with heart failure: Secondary | ICD-10-CM | POA: Diagnosis not present

## 2022-06-28 DIAGNOSIS — R231 Pallor: Secondary | ICD-10-CM | POA: Diagnosis not present

## 2022-06-28 DIAGNOSIS — I4891 Unspecified atrial fibrillation: Secondary | ICD-10-CM | POA: Diagnosis not present

## 2022-06-28 DIAGNOSIS — R058 Other specified cough: Secondary | ICD-10-CM

## 2022-06-28 DIAGNOSIS — Z85828 Personal history of other malignant neoplasm of skin: Secondary | ICD-10-CM | POA: Insufficient documentation

## 2022-06-28 LAB — CBC
HCT: 52.7 % — ABNORMAL HIGH (ref 39.0–52.0)
Hemoglobin: 17.4 g/dL — ABNORMAL HIGH (ref 13.0–17.0)
MCH: 30.1 pg (ref 26.0–34.0)
MCHC: 33 g/dL (ref 30.0–36.0)
MCV: 91.2 fL (ref 80.0–100.0)
Platelets: 188 10*3/uL (ref 150–400)
RBC: 5.78 MIL/uL (ref 4.22–5.81)
RDW: 14.7 % (ref 11.5–15.5)
WBC: 10 10*3/uL (ref 4.0–10.5)
nRBC: 0 % (ref 0.0–0.2)

## 2022-06-28 LAB — MAGNESIUM: Magnesium: 1.9 mg/dL (ref 1.7–2.4)

## 2022-06-28 LAB — BASIC METABOLIC PANEL
Anion gap: 7 (ref 5–15)
BUN: 18 mg/dL (ref 8–23)
CO2: 30 mmol/L (ref 22–32)
Calcium: 9.8 mg/dL (ref 8.9–10.3)
Chloride: 102 mmol/L (ref 98–111)
Creatinine, Ser: 1.02 mg/dL (ref 0.61–1.24)
GFR, Estimated: >60 mL/min (ref >60)
Glucose, Bld: 167 mg/dL — ABNORMAL HIGH (ref 70–99)
Potassium: 3.8 mmol/L (ref 3.5–5.1)
Sodium: 139 mmol/L (ref 135–145)

## 2022-06-28 LAB — COMPREHENSIVE METABOLIC PANEL
ALT: 10 U/L (ref 0–44)
AST: 19 U/L (ref 15–41)
Albumin: 3.6 g/dL (ref 3.5–5.0)
Alkaline Phosphatase: 77 U/L (ref 38–126)
Anion gap: 7 (ref 5–15)
BUN: 18 mg/dL (ref 8–23)
CO2: 30 mmol/L (ref 22–32)
Calcium: 10.1 mg/dL (ref 8.9–10.3)
Chloride: 103 mmol/L (ref 98–111)
Creatinine, Ser: 1.03 mg/dL (ref 0.61–1.24)
GFR, Estimated: >60 mL/min (ref >60)
Glucose, Bld: 163 mg/dL — ABNORMAL HIGH (ref 70–99)
Potassium: 3.4 mmol/L — ABNORMAL LOW (ref 3.5–5.1)
Sodium: 140 mmol/L (ref 135–145)
Total Bilirubin: 0.8 mg/dL (ref 0.3–1.2)
Total Protein: 7 g/dL (ref 6.5–8.1)

## 2022-06-28 LAB — URINALYSIS, ROUTINE W REFLEX MICROSCOPIC
Bacteria, UA: NONE SEEN
Bilirubin Urine: NEGATIVE
Glucose, UA: >=500 mg/dL — AB
Hgb urine dipstick: NEGATIVE
Ketones, ur: NEGATIVE mg/dL
Leukocytes,Ua: NEGATIVE
Nitrite: NEGATIVE
Protein, ur: 100 mg/dL — AB
Specific Gravity, Urine: 1.038 — ABNORMAL HIGH (ref 1.005–1.030)
Squamous Epithelial / HPF: NONE SEEN (ref 0–5)
pH: 6 (ref 5.0–8.0)

## 2022-06-28 LAB — CBC WITH DIFFERENTIAL/PLATELET
Abs Immature Granulocytes: 0.05 10*3/uL (ref 0.00–0.07)
Basophils Absolute: 0.1 10*3/uL (ref 0.0–0.1)
Basophils Relative: 1 %
Eosinophils Absolute: 0.1 10*3/uL (ref 0.0–0.5)
Eosinophils Relative: 1 %
HCT: 54.2 % — ABNORMAL HIGH (ref 39.0–52.0)
Hemoglobin: 17.7 g/dL — ABNORMAL HIGH (ref 13.0–17.0)
Immature Granulocytes: 1 %
Lymphocytes Relative: 14 %
Lymphs Abs: 1.4 10*3/uL (ref 0.7–4.0)
MCH: 29.7 pg (ref 26.0–34.0)
MCHC: 32.7 g/dL (ref 30.0–36.0)
MCV: 91.1 fL (ref 80.0–100.0)
Monocytes Absolute: 1.1 10*3/uL — ABNORMAL HIGH (ref 0.1–1.0)
Monocytes Relative: 11 %
Neutro Abs: 7.4 10*3/uL (ref 1.7–7.7)
Neutrophils Relative %: 72 %
Platelets: 210 10*3/uL (ref 150–400)
RBC: 5.95 MIL/uL — ABNORMAL HIGH (ref 4.22–5.81)
RDW: 14.6 % (ref 11.5–15.5)
WBC: 10.2 10*3/uL (ref 4.0–10.5)
nRBC: 0 % (ref 0.0–0.2)

## 2022-06-28 LAB — PROTIME-INR
INR: 1.4 — ABNORMAL HIGH (ref 0.8–1.2)
Prothrombin Time: 16.6 seconds — ABNORMAL HIGH (ref 11.4–15.2)

## 2022-06-28 LAB — SARS CORONAVIRUS 2 BY RT PCR: SARS Coronavirus 2 by RT PCR: NEGATIVE

## 2022-06-28 LAB — TROPONIN I (HIGH SENSITIVITY)
Troponin I (High Sensitivity): 27 ng/L — ABNORMAL HIGH (ref <18)
Troponin I (High Sensitivity): 28 ng/L — ABNORMAL HIGH (ref <18)
Troponin I (High Sensitivity): 12 ng/L (ref <18)

## 2022-06-28 LAB — BRAIN NATRIURETIC PEPTIDE: B Natriuretic Peptide: 93.4 pg/mL (ref 0.0–100.0)

## 2022-06-28 LAB — HIV ANTIBODY (ROUTINE TESTING W REFLEX): HIV Screen 4th Generation wRfx: NONREACTIVE

## 2022-06-28 LAB — LIPASE, BLOOD: Lipase: 25 U/L (ref 11–51)

## 2022-06-28 MED ORDER — OXYCODONE HCL 5 MG PO TABS
10.0000 mg | ORAL_TABLET | ORAL | Status: DC | PRN
  Administered 2022-06-28 – 2022-07-01 (×7): 10 mg via ORAL
  Filled 2022-06-28 (×7): qty 2

## 2022-06-28 MED ORDER — RANOLAZINE ER 500 MG PO TB12
1000.0000 mg | ORAL_TABLET | Freq: Two times a day (BID) | ORAL | Status: DC
  Administered 2022-06-28 – 2022-07-01 (×7): 1000 mg via ORAL
  Filled 2022-06-28 (×7): qty 2

## 2022-06-28 MED ORDER — SODIUM CHLORIDE 0.9 % IV SOLN
500.0000 mg | Freq: Once | INTRAVENOUS | Status: AC
  Administered 2022-06-28: 500 mg via INTRAVENOUS
  Filled 2022-06-28: qty 5

## 2022-06-28 MED ORDER — AMLODIPINE BESYLATE 5 MG PO TABS
2.5000 mg | ORAL_TABLET | Freq: Every day | ORAL | Status: DC
  Administered 2022-06-28 – 2022-07-01 (×4): 2.5 mg via ORAL
  Filled 2022-06-28 (×4): qty 1

## 2022-06-28 MED ORDER — NALOXONE HCL 4 MG/0.1ML NA LIQD: 0.4000 mg | Freq: Once | NASAL | Status: DC | PRN

## 2022-06-28 MED ORDER — APIXABAN 5 MG PO TABS
5.0000 mg | ORAL_TABLET | Freq: Two times a day (BID) | ORAL | Status: DC
  Administered 2022-06-28 – 2022-07-01 (×7): 5 mg via ORAL
  Filled 2022-06-28 (×7): qty 1

## 2022-06-28 MED ORDER — LORATADINE 10 MG PO TABS
10.0000 mg | ORAL_TABLET | Freq: Every day | ORAL | Status: DC
  Administered 2022-06-28 – 2022-07-01 (×4): 10 mg via ORAL
  Filled 2022-06-28 (×4): qty 1

## 2022-06-28 MED ORDER — AMIODARONE HCL 200 MG PO TABS
200.0000 mg | ORAL_TABLET | Freq: Every day | ORAL | Status: DC
  Administered 2022-06-28: 200 mg via ORAL
  Filled 2022-06-28: qty 1

## 2022-06-28 MED ORDER — TRAZODONE HCL 50 MG PO TABS: 25.0000 mg | ORAL_TABLET | Freq: Every evening | ORAL | Status: DC | PRN

## 2022-06-28 MED ORDER — ACETAMINOPHEN 325 MG PO TABS: 650.0000 mg | ORAL_TABLET | Freq: Four times a day (QID) | ORAL | Status: DC | PRN

## 2022-06-28 MED ORDER — FENTANYL CITRATE PF 50 MCG/ML IJ SOSY
25.0000 ug | PREFILLED_SYRINGE | Freq: Once | INTRAMUSCULAR | Status: AC
  Administered 2022-06-28: 25 ug via INTRAVENOUS
  Filled 2022-06-28: qty 1

## 2022-06-28 MED ORDER — GUAIFENESIN ER 600 MG PO TB12
600.0000 mg | ORAL_TABLET | Freq: Two times a day (BID) | ORAL | Status: DC
  Administered 2022-06-28 – 2022-07-01 (×8): 600 mg via ORAL
  Filled 2022-06-28 (×8): qty 1

## 2022-06-28 MED ORDER — ALBUTEROL SULFATE (2.5 MG/3ML) 0.083% IN NEBU: 2.5000 mg | INHALATION_SOLUTION | RESPIRATORY_TRACT | Status: DC | PRN

## 2022-06-28 MED ORDER — VENLAFAXINE HCL ER 75 MG PO CP24
75.0000 mg | ORAL_CAPSULE | Freq: Every day | ORAL | Status: DC
  Administered 2022-06-28 – 2022-07-01 (×4): 75 mg via ORAL
  Filled 2022-06-28 (×4): qty 1

## 2022-06-28 MED ORDER — ONDANSETRON HCL 4 MG PO TABS: 4.0000 mg | ORAL_TABLET | Freq: Four times a day (QID) | ORAL | Status: DC | PRN

## 2022-06-28 MED ORDER — POTASSIUM CHLORIDE 20 MEQ PO PACK: 40.0000 meq | PACK | Freq: Once | ORAL | Status: DC

## 2022-06-28 MED ORDER — METHOCARBAMOL 500 MG PO TABS
500.0000 mg | ORAL_TABLET | Freq: Three times a day (TID) | ORAL | Status: DC | PRN
  Administered 2022-06-28 – 2022-06-30 (×2): 500 mg via ORAL
  Filled 2022-06-28 (×3): qty 1

## 2022-06-28 MED ORDER — ASPIRIN 81 MG PO TBEC
81.0000 mg | DELAYED_RELEASE_TABLET | Freq: Every day | ORAL | Status: DC
  Administered 2022-06-28 – 2022-07-01 (×4): 81 mg via ORAL
  Filled 2022-06-28 (×4): qty 1

## 2022-06-28 MED ORDER — ISOSORBIDE MONONITRATE ER 60 MG PO TB24
120.0000 mg | ORAL_TABLET | Freq: Every day | ORAL | Status: DC
  Administered 2022-06-28 – 2022-07-01 (×4): 120 mg via ORAL
  Filled 2022-06-28 (×4): qty 2

## 2022-06-28 MED ORDER — TIOTROPIUM BROMIDE MONOHYDRATE 18 MCG IN CAPS
18.0000 ug | ORAL_CAPSULE | Freq: Every day | RESPIRATORY_TRACT | Status: DC
  Administered 2022-06-28 – 2022-07-01 (×4): 18 ug via RESPIRATORY_TRACT
  Filled 2022-06-28: qty 5

## 2022-06-28 MED ORDER — SODIUM CHLORIDE 0.9 % IV SOLN: INTRAVENOUS | Status: DC

## 2022-06-28 MED ORDER — HYDROCOD POLI-CHLORPHE POLI ER 10-8 MG/5ML PO SUER
5.0000 mL | Freq: Two times a day (BID) | ORAL | Status: DC | PRN
  Administered 2022-06-28 – 2022-06-30 (×2): 5 mL via ORAL
  Filled 2022-06-28 (×2): qty 5

## 2022-06-28 MED ORDER — OXYCODONE-ACETAMINOPHEN 10-325 MG PO TABS: 1.0000 | ORAL_TABLET | ORAL | Status: DC | PRN

## 2022-06-28 MED ORDER — MORPHINE SULFATE (PF) 4 MG/ML IV SOLN
4.0000 mg | Freq: Once | INTRAVENOUS | Status: AC
  Administered 2022-06-28: 4 mg via INTRAVENOUS
  Filled 2022-06-28: qty 1

## 2022-06-28 MED ORDER — DILTIAZEM HCL 25 MG/5ML IV SOLN
10.0000 mg | Freq: Once | INTRAVENOUS | Status: AC
  Administered 2022-06-28: 10 mg via INTRAVENOUS
  Filled 2022-06-28: qty 5

## 2022-06-28 MED ORDER — METOPROLOL TARTRATE 25 MG PO TABS
25.0000 mg | ORAL_TABLET | Freq: Two times a day (BID) | ORAL | Status: DC
  Administered 2022-06-28 – 2022-07-01 (×7): 25 mg via ORAL
  Filled 2022-06-28 (×7): qty 1

## 2022-06-28 MED ORDER — ONDANSETRON HCL 4 MG/2ML IJ SOLN: 4.0000 mg | Freq: Four times a day (QID) | INTRAMUSCULAR | Status: DC | PRN

## 2022-06-28 MED ORDER — ACETAMINOPHEN 325 MG RE SUPP: 650.0000 mg | Freq: Four times a day (QID) | RECTAL | Status: DC | PRN

## 2022-06-28 MED ORDER — ALLOPURINOL 100 MG PO TABS
300.0000 mg | ORAL_TABLET | Freq: Two times a day (BID) | ORAL | Status: DC
  Administered 2022-06-28 – 2022-07-01 (×7): 300 mg via ORAL
  Filled 2022-06-28 (×5): qty 3
  Filled 2022-06-28: qty 1
  Filled 2022-06-28: qty 3

## 2022-06-28 MED ORDER — ASPIRIN 81 MG PO CHEW
81.0000 mg | CHEWABLE_TABLET | Freq: Every day | ORAL | Status: DC
Start: 2022-06-28 — End: 2022-06-28

## 2022-06-28 MED ORDER — LISINOPRIL 10 MG PO TABS
10.0000 mg | ORAL_TABLET | Freq: Every day | ORAL | Status: DC
  Administered 2022-06-28 – 2022-07-01 (×4): 10 mg via ORAL
  Filled 2022-06-28 (×4): qty 1

## 2022-06-28 MED ORDER — ACETAMINOPHEN 650 MG RE SUPP: 650.0000 mg | Freq: Four times a day (QID) | RECTAL | Status: DC | PRN

## 2022-06-28 MED ORDER — DOCUSATE SODIUM 100 MG PO CAPS: 100.0000 mg | ORAL_CAPSULE | Freq: Every day | ORAL | Status: DC | PRN

## 2022-06-28 MED ORDER — ALPRAZOLAM 0.5 MG PO TABS
1.0000 mg | ORAL_TABLET | Freq: Three times a day (TID) | ORAL | Status: DC
  Administered 2022-06-28 – 2022-07-01 (×10): 1 mg via ORAL
  Filled 2022-06-28 (×10): qty 2

## 2022-06-28 MED ORDER — MAGNESIUM HYDROXIDE 400 MG/5ML PO SUSP: 30.0000 mL | Freq: Every day | ORAL | Status: DC | PRN

## 2022-06-28 MED ORDER — ACETAMINOPHEN 325 MG PO TABS
325.0000 mg | ORAL_TABLET | ORAL | Status: DC | PRN
Start: 2022-06-28 — End: 2022-07-01
  Administered 2022-07-01: 325 mg via ORAL
  Filled 2022-06-28: qty 1

## 2022-06-28 MED ORDER — ISOSORBIDE MONONITRATE ER 60 MG PO TB24: 60.0000 mg | ORAL_TABLET | Freq: Every day | ORAL | Status: DC

## 2022-06-28 MED ORDER — HYDROMORPHONE HCL 1 MG/ML IJ SOLN
1.0000 mg | INTRAMUSCULAR | Status: DC | PRN
  Administered 2022-06-28: 1 mg via INTRAVENOUS
  Filled 2022-06-28: qty 1

## 2022-06-28 MED ORDER — TORSEMIDE 20 MG PO TABS: 50.0000 mg | ORAL_TABLET | Freq: Every day | ORAL | Status: DC | PRN

## 2022-06-28 MED ORDER — MAGNESIUM OXIDE 400 MG PO TABS
400.0000 mg | ORAL_TABLET | Freq: Every day | ORAL | Status: DC
  Administered 2022-06-28 – 2022-07-01 (×4): 400 mg via ORAL
  Filled 2022-06-28 (×8): qty 1

## 2022-06-28 MED ORDER — DILTIAZEM HCL-DEXTROSE 125-5 MG/125ML-% IV SOLN (PREMIX)
5.0000 mg/h | INTRAVENOUS | Status: DC
  Administered 2022-06-28: 5 mg/h via INTRAVENOUS
  Administered 2022-06-28: 7.5 mg/h via INTRAVENOUS
  Filled 2022-06-28 (×2): qty 125

## 2022-06-28 MED ORDER — METHYLPREDNISOLONE SODIUM SUCC 125 MG IJ SOLR
125.0000 mg | Freq: Once | INTRAMUSCULAR | Status: AC
  Administered 2022-06-28: 125 mg via INTRAVENOUS
  Filled 2022-06-28: qty 2

## 2022-06-28 MED ORDER — METFORMIN HCL ER 500 MG PO TB24
500.0000 mg | ORAL_TABLET | Freq: Two times a day (BID) | ORAL | Status: DC
  Administered 2022-06-28 – 2022-07-01 (×8): 500 mg via ORAL
  Filled 2022-06-28 (×9): qty 1

## 2022-06-28 MED ORDER — SODIUM CHLORIDE 0.9 % IV SOLN
INTRAVENOUS | Status: DC
Start: 2022-06-28 — End: 2022-06-29

## 2022-06-28 MED ORDER — ATORVASTATIN CALCIUM 80 MG PO TABS
80.0000 mg | ORAL_TABLET | Freq: Every day | ORAL | Status: DC
  Administered 2022-06-28 – 2022-06-30 (×3): 80 mg via ORAL
  Filled 2022-06-28 (×3): qty 1

## 2022-06-28 MED ORDER — POTASSIUM CHLORIDE 20 MEQ PO PACK
40.0000 meq | PACK | Freq: Once | ORAL | Status: AC
  Administered 2022-06-28: 40 meq via ORAL
  Filled 2022-06-28: qty 2

## 2022-06-28 MED ORDER — DAPAGLIFLOZIN PROPANEDIOL 10 MG PO TABS
10.0000 mg | ORAL_TABLET | Freq: Every day | ORAL | Status: DC
  Administered 2022-06-28 – 2022-07-01 (×4): 10 mg via ORAL
  Filled 2022-06-28 (×4): qty 1

## 2022-06-28 MED ORDER — OMEGA-3-ACID ETHYL ESTERS 1 G PO CAPS
4.0000 | ORAL_CAPSULE | Freq: Every day | ORAL | Status: DC
  Administered 2022-06-28 – 2022-07-01 (×4): 4 g via ORAL
  Filled 2022-06-28 (×5): qty 4

## 2022-06-28 MED ORDER — NITROGLYCERIN 0.4 MG/SPRAY TL SOLN: 1.0000 | Status: DC | PRN

## 2022-06-28 MED ORDER — AMIODARONE HCL 200 MG PO TABS
400.0000 mg | ORAL_TABLET | Freq: Two times a day (BID) | ORAL | Status: DC
  Administered 2022-06-28 – 2022-07-01 (×6): 400 mg via ORAL
  Filled 2022-06-28 (×6): qty 2

## 2022-06-28 MED ORDER — LINACLOTIDE 72 MCG PO CAPS
72.0000 ug | ORAL_CAPSULE | Freq: Every day | ORAL | Status: DC
  Administered 2022-06-28 – 2022-07-01 (×4): 72 ug via ORAL
  Filled 2022-06-28 (×4): qty 1

## 2022-06-28 MED ORDER — ALBUTEROL SULFATE HFA 108 (90 BASE) MCG/ACT IN AERS: 1.0000 | INHALATION_SPRAY | RESPIRATORY_TRACT | Status: DC | PRN

## 2022-06-28 MED ORDER — ONDANSETRON 4 MG PO TBDP: 4.0000 mg | ORAL_TABLET | Freq: Three times a day (TID) | ORAL | Status: DC | PRN

## 2022-06-28 MED ORDER — PANTOPRAZOLE SODIUM 40 MG PO TBEC
40.0000 mg | DELAYED_RELEASE_TABLET | Freq: Every day | ORAL | Status: DC
  Administered 2022-06-28 – 2022-07-01 (×4): 40 mg via ORAL
  Filled 2022-06-28 (×4): qty 1

## 2022-06-28 MED ORDER — SODIUM CHLORIDE 0.9 % IV SOLN
1.0000 g | Freq: Once | INTRAVENOUS | Status: AC
  Administered 2022-06-28: 1 g via INTRAVENOUS
  Filled 2022-06-28: qty 10

## 2022-06-28 MED ORDER — ENOXAPARIN SODIUM 40 MG/0.4ML IJ SOSY: 40.0000 mg | PREFILLED_SYRINGE | INTRAMUSCULAR | Status: DC

## 2022-06-28 MED ORDER — NITROGLYCERIN 0.4 MG SL SUBL: 0.4000 mg | SUBLINGUAL_TABLET | SUBLINGUAL | Status: DC | PRN

## 2022-06-28 MED ORDER — POTASSIUM CHLORIDE IN NACL 20-0.9 MEQ/L-% IV SOLN
INTRAVENOUS | Status: DC
  Filled 2022-06-28: qty 1000

## 2022-06-28 NOTE — Assessment & Plan Note (Signed)
>>  ASSESSMENT AND PLAN FOR ATRIAL FIBRILLATION WITH RVR WRITTEN ON 06/28/2022  3:08 PM BY Arnetha Courser, MD  - The patient will be admitted to a progressive unit bed. - We will continue him on IV Cardizem drip. -Amiodarone was increased to 400 mg by cardiology -Cardioversion tomorrow

## 2022-06-28 NOTE — Assessment & Plan Note (Signed)
>>  ASSESSMENT AND PLAN FOR PAROXYSMAL ATRIAL FIBRILLATION WRITTEN ON 03/01/2023 12:37 PM BY Malva Limes, MD  >>ASSESSMENT AND PLAN FOR ATRIAL FIBRILLATION WRITTEN ON 03/01/2023 12:37 PM BY Malva Limes, MD  >>ASSESSMENT AND PLAN FOR ATRIAL FIBRILLATION WITH RVR WRITTEN ON 06/28/2022  3:08 PM BY Arnetha Courser, MD  - The patient will be admitted to a progressive unit bed. - We will continue him on IV Cardizem drip. -Amiodarone was increased to 400 mg by cardiology -Cardioversion tomorrow

## 2022-06-28 NOTE — ED Notes (Signed)
EDP D Smith in room at this time.

## 2022-06-28 NOTE — ED Notes (Signed)
Patient resting on stretcher with eyes closed. Meal tray at bedside.

## 2022-06-28 NOTE — Assessment & Plan Note (Signed)
-   We will continue statin therapy. 

## 2022-06-28 NOTE — Progress Notes (Signed)
Notified provider of 2 second pause and clarified metoprolol order for tonight.  Decreased cardizem gtt to 73m/hr, and will give metoprolol as ordered.

## 2022-06-28 NOTE — Assessment & Plan Note (Addendum)
Magnesium at 1.9, potassium was repleted.

## 2022-06-28 NOTE — Assessment & Plan Note (Signed)
Currently stable on his baseline oxygen requirement of 2 L. Continue home bronchodilators

## 2022-06-28 NOTE — Assessment & Plan Note (Signed)
-   Patient will be given mucolytics and bronchodilator therapy as needed.  This is likely viral. - She has no evidence for pneumonia on chest x-ray.

## 2022-06-28 NOTE — Assessment & Plan Note (Addendum)
-   The patient will be admitted to a progressive unit bed. - We will continue him on IV Cardizem drip. -Amiodarone was increased to 400 mg by cardiology -Cardioversion tomorrow

## 2022-06-28 NOTE — Assessment & Plan Note (Signed)
-   We will continue his antihypertensives. 

## 2022-06-28 NOTE — Consult Note (Signed)
[facility] Consultation Note  Patient ID: Lawrence [name], MRN: [id number], DOB/AGE: [birth year] 66 y.o. Admit date: 06/28/2022   Date of Consult: 06/28/2022 Primary Physician: Birdie Sons, MD Primary Cardiologist: [facility]  Chief Complaint:  Chief Complaint  Patient presents with   Chest Pain   Reason for Consult:  Atrial fibrillation rapid ventricular rate  HPI: 66 y.o. male with known coronary artery disease status post coronary bypass graft sleep apnea hypertension hyperlipidemia and known chronic angina on appropriate medication management.  The patient has had paroxysmal nonvalvular atrial fibrillation but he recently started on amiodarone for a load as well as has been on anticoagulation for many years.  There is been no evidence of gap between his anticoagulation dosages and he claims that he has been taking it rigorously for quite some time.  The patient had a new onset of severe shortness of breath as well as concerns for chest discomfort.  The chest discomfort has completely resolved now that she has had several medication management to improve heart rate control of atrial fibrillation.  We have discussed at length the possibility of electrical cardioversion to normal sinus rhythm for management of atrial fibrillation as well as anginal symptoms and rapid heart rate.  He understands the risk and benefits of this including the possibility of death stroke heart attack blood clot side effects of medication management.  He also claims that he has been taking his amiodarone diligently as well as his anticoagulation without any gaps in medication management.  The patient is at low risk for general anesthesia.  Past Medical History:  Diagnosis Date   A-fib ([place])    Anemia    Anginal pain ([place])    Anxiety    Arthritis    Asthma    CAD (coronary artery disease)    a. 2002 CABGx2 (LIMA->LAD, VG->VG->OM1);  b. 09/2012 DES->OM;  c. 03/2015 PTCA of LAD [facility]) in setting of  atretic LIMA; d. 05/2015 Cath [facility]): nonobs dzs; e. 06/2015 Cath ([facility]): LM nl, LAD 45p/d ISR, 50d, D1/2 small, LCX 50p/d ISR, OM1 70ost, 30 ISR, VG->OM1 50ost, 47m LIMA->LAD 99p/d - atretic, RCA dom, nl; f.cath 10/16: 40-50%(FFR 0.90) pLAD, 75% (FFR 0.77) mLAD s/p PCI/DES, oRCA 40% (FFR0.95)   Cancer (HCC)    SKIN CANCER ON BACK   Celiac disease    Chronic diastolic CHF (congestive heart failure) ([place]    a. 06/2009 Echo: EF 60-65%, Gr 1 DD, triv AI, mildly dil LA, nl RV.   COPD (chronic obstructive pulmonary disease) ([place]    a. Chronic bronchitis and emphysema.   DDD (degenerative disc disease), lumbar    Diverticulosis    Dysrhythmia    Essential hypertension    GERD (gastroesophageal reflux disease)    History of hiatal hernia    History of kidney stones    H/O   History of tobacco abuse    a. Quit 2014.   Myocardial infarction ([facility] 2002   4 STENTS   Pancreatitis    PSVT (paroxysmal supraventricular tachycardia) ([place]    a. 10/2012 Noted on [facility] Patch.   Sleep apnea    LOST CORD TO CPAP -ONLY 02 @ BEDTIME   Tubular adenoma of colon    Type II diabetes mellitus ([facility]       Surgical History:  Past Surgical History:  Procedure Laterality Date   BYPASS GRAFT     CARDIAC CATHETERIZATION N/A 07/12/2015   rocedure: Left Heart Cath and Cors/Grafts Angiography;  Surgeon: HLynnell Dike  Tamala Julian, MD;  Location: [place] CV LAB;  Service: Cardiovascular;  Laterality: N/A;   CARDIAC CATHETERIZATION Right 10/07/2015   Procedure: Left Heart Cath and Cors/Grafts Angiography;  Surgeon: Dionisio David, MD;  Location: [facility] [place] [facility];  Service: Cardiovascular;  Laterality: Right;   CARDIAC CATHETERIZATION N/A 04/06/2016   Procedure: Left Heart Cath and Coronary Angiography;  Surgeon: Yolonda Kida, MD;  Location: [place] [facility];  Service: Cardiovascular;  Laterality: N/A;   CARDIAC CATHETERIZATION  04/06/2016   Procedure: Bypass Graft Angiography;  Surgeon: Yolonda Kida, MD;   Location: [place] [facility];  Service: Cardiovascular;;   CARDIAC CATHETERIZATION N/A 11/02/2016   Procedure: Left Heart Cath and Cors/Grafts Angiography and possible PCI;  Surgeon: Yolonda Kida, MD;  Location: [place] [facility];  Service: Cardiovascular;  Laterality: N/A;   CARDIAC CATHETERIZATION N/A 11/02/2016   Procedure: Coronary Stent Intervention;  Surgeon: Yolonda Kida, MD;  Location: [place] CV LAB;  Service: Cardiovascular;  Laterality: N/A;   CHOLECYSTECTOMY     CIRCUMCISION N/A 06/09/2019   Procedure: CIRCUMCISION ADULT;  Surgeon: Billey Co, MD;  Location: [facility] ORS;  Service: Urology;  Laterality: N/A;   COLONOSCOPY WITH PROPOFOL N/A 04/01/2018   Procedure: COLONOSCOPY WITH PROPOFOL;  Surgeon: Manya Silvas, MD;  Location[facility]COPY;  Service: Endoscopy;  Laterality: N/A;   ESOPHAGEAL DILATION     ESOPHAGOGASTRODUODENOSCOPY (EGD) WITH PROPOFOL N/A 04/01/2018   Procedure: ESOPHAGOGASTRODUODENOSCOPY (EGD) WITH PROPOFOL;  Surgeon: Manya Silvas, MD;  Location: [facility] ENDOSCOPY;  Service: Endoscopy;  Laterality: N/A;   LEFT HEART CATH AND CORS/GRAFTS ANGIOGRAPHY N/A 06/12/2019   Procedure: LEFT HEART CATH AND CORS/GRAFTS ANGIOGRAPHY;  Surgeon: Teodoro Spray, MD;  Location: [place] [facility] LAB;  Service: Cardiovascular;  Laterality: N/A;   LEFT HEART CATH AND CORS/GRAFTS ANGIOGRAPHY N/A 03/11/2020   Procedure: LEFT HEART CATH AND CORS/GRAFTS ANGIOGRAPHY;  Surgeon: Isaias Cowman, MD;  Location: [place] [facility] LAB;  Service: Cardiovascular;  Laterality: N/A;   LEFT HEART CATH AND CORS/GRAFTS ANGIOGRAPHY N/A 05/01/2021   Procedure: LEFT HEART CATH AND CORS/GRAFTS ANGIOGRAPHY;  Surgeon: Corey Skains, MD;  Location: [place] [facility];  Service: Cardiovascular;  Laterality: N/A;   TONSILLECTOMY     VASCULAR SURGERY       Home Meds: Prior to Admission medications   Medication Sig Start Date End Date Taking? Authorizing Provider   allopurinol (ZYLOPRIM) 300 MG tablet TAKE 1 TABLET BY MOUTH TWICE A DAY 10/15/21  Yes Birdie Sons, MD  ALPRAZolam Duanne Moron) 1 MG tablet Take 1 tablet (1 mg total) by mouth 3 (three) times daily. 06/18/22  Yes Birdie Sons, MD  amiodarone (PACERONE) 200 MG tablet Take 200 mg by mouth daily. 06/04/22  Yes [provider]  amLODipine (NORVASC) 2.5 MG tablet TAKE 1 TABLET BY MOUTH DAILY 03/02/22  Yes Birdie Sons, MD  aspirin 81 MG chewable tablet Chew 81 mg by mouth daily. Swallows whole   Yes [provider]  atorvastatin (LIPITOR) 80 MG tablet TAKE 1 TABLET BY MOUTH AT BEDTIME 04/16/22  Yes Birdie Sons, MD  BELSOMRA 5 MG TABS TAKE 1 TABLET (5 MG TOTAL) BY MOUTH AT BEDTIME AS NEEDED 06/17/22  Yes Birdie Sons, MD  celecoxib (CELEBREX) 200 MG capsule Take 1 capsule by mouth 2 (two) times daily. 11/03/21  Yes [provider]  cetirizine (ZYRTEC) 10 MG tablet TAKE 1 TABLET BY MOUTH AT BEDTIME 04/16/22  Yes [name]  E, MD  ELIQUIS 5 MG TABS tablet TAKE 1 TABLET BY MOUTH TWICE A DAY 05/26/21  Yes Birdie Sons, MD  FARXIGA 10 MG TABS tablet TAKE 1 TABLET BY MOUTH DAILY 02/09/22  Yes Birdie Sons, MD  fluticasone furoate-vilanterol (BREO ELLIPTA) 100-25 MCG/INH AEPB INHALE 1 PUFF BY MOUTH INTO THE LUNGS DAILY. 07/18/21  Yes Birdie Sons, MD  isosorbide mononitrate (IMDUR) 120 MG 24 hr tablet TAKE 1 TABLET BY MOUTH DAILY 08/20/21  Yes Birdie Sons, MD  isosorbide mononitrate (IMDUR) 60 MG 24 hr tablet Take 60 mg by mouth daily. 06/13/20  Yes [provider]  linaclotide (LINZESS) 72 MCG capsule Take 72 mcg by mouth daily before breakfast.    Yes [provider]  lisinopril (ZESTRIL) 10 MG tablet TAKE 1 TABLET BY MOUTH DAILY 09/17/21  Yes Birdie Sons, MD  magnesium oxide (MAG-OX) 400 MG tablet Take 1 tablet by mouth daily. 03/23/17  Yes [provider]  metFORMIN (GLUCOPHAGE-XR) 500 MG 24 hr tablet TAKE 1 TABLET BY MOUTH TWICE  A DAY 08/28/21  Yes Birdie Sons, MD  methocarbamol (ROBAXIN) 500 MG tablet TAKE 1 TABLET BY MOUTH EVERY 8 HOURS AS NEEDED FOR MUSCLE SPASMS 03/02/22  Yes Birdie Sons, MD  metoprolol tartrate (LOPRESSOR) 25 MG tablet Take 25 mg by mouth 2 (two) times daily. 06/27/22  Yes [provider]  omega-3 acid ethyl esters (LOVAZA) 1 g capsule TAKE 4 CAPSULES BY MOUTH DAILY 08/20/21  Yes Birdie Sons, MD  omeprazole (PRILOSEC) 40 MG capsule TAKE 1 CAPSULE BY MOUTH 2 TIMES DAILY 30 MINUTES BEFORE BREAKFAST AND DINNER 03/10/22  Yes Birdie Sons, MD  oxyCODONE-acetaminophen (PERCOCET) 10-325 MG tablet TAKE 1 TABLET BY MOUTH EVERY 4 HOURS AS NEEDED FOR PAIN 06/17/22  Yes Birdie Sons, MD  ranolazine (RANEXA) 1000 MG SR tablet Take 1 tablet (1,000 mg total) by mouth 2 (two) times daily. 04/02/21  Yes Birdie Sons, MD  SPIRIVA HANDIHALER 18 MCG inhalation capsule PLACE 1 CAPSULE INTO INHALER AND INHALE BY MOUTH ONCE A DAY. 06/02/22  Yes Birdie Sons, MD  traZODone (DESYREL) 150 MG tablet TAKE 1 TABLET BY MOUTH AT BEDTIME 09/01/21  Yes Birdie Sons, MD  venlafaxine XR (EFFEXOR-XR) 75 MG 24 hr capsule TAKE 1 CAPSULE BY MOUTH DAILY WITH BREAKFAST 12/16/21  Yes Birdie Sons, MD  Accu-Chek Softclix Lancets lancets Use as instructed to check sugar daily for type 2 diabetes. 03/03/21   Birdie Sons, MD  albuterol (VENTOLIN HFA) 108 (90 Base) MCG/ACT inhaler INHALE 2 PUFFS BY MOUTH EVERY 6 HOURS AS NEEDED FOR SHORTNESS OF BREATH 12/02/21   Birdie Sons, MD  Blood Glucose Calibration (ACCU-CHEK GUIDE CONTROL) LIQD Use with blood glucose monitor as directed 02/20/21   Birdie Sons, MD  Blood Glucose Monitoring Suppl (ACCU-CHEK GUIDE) w/Device KIT Use to check blood sugars as directed 02/20/21   Birdie Sons, MD  docusate sodium (COLACE) 100 MG capsule Take 100 mg by mouth daily as needed for mild constipation.    [provider]  glucose blood (ACCU-CHEK GUIDE) test strip  Use as instructed to check sugar daily for type 2 diabetes. 03/03/21   Birdie Sons, MD  naloxone [facility]) nasal spray 4 mg/0.1 mL 1 spray into nostril x1 and may repeat every 2-3 minutes until patient is responsive or EMS arrives 08/21/20   Birdie Sons, MD  nitroGLYCERIN (NITROSTAT) 0.4 MG SL tablet PLACE ONE  TABLET UNDER THE TONGUE EVERY 5 MINUTES FOR CHEST PAIN. IF NO RELIEF PAST 3RD TAB GO TO EMERGENCY ROOM 12/02/21   Birdie Sons, MD  ondansetron (ZOFRAN-ODT) 4 MG disintegrating tablet Take 1 tablet (4 mg total) by mouth every 8 (eight) hours as needed for nausea or vomiting. 04/08/22   Birdie Sons, MD  torsemide (DEMADEX) 100 MG tablet Take 0.5 tablets (50 mg total) by mouth daily. 05/13/20   Birdie Sons, MD    Inpatient Medications:   allopurinol  300 mg Oral BID   ALPRAZolam  1 mg Oral TID   amiodarone  200 mg Oral Daily   amLODipine  2.5 mg Oral Daily   apixaban  5 mg Oral BID   aspirin EC  81 mg Oral Daily   atorvastatin  80 mg Oral QHS   dapagliflozin propanediol  10 mg Oral Daily   guaiFENesin  600 mg Oral BID   isosorbide mononitrate  120 mg Oral Daily   linaclotide  72 mcg Oral QAC breakfast   lisinopril  10 mg Oral Daily   loratadine  10 mg Oral Daily   magnesium oxide  400 mg Oral Daily   metFORMIN  500 mg Oral BID WC   metoprolol tartrate  25 mg Oral BID   omega-3 acid ethyl esters  4 capsule Oral Daily   pantoprazole  40 mg Oral Daily   potassium chloride  40 mEq Oral Once   ranolazine  1,000 mg Oral BID   tiotropium  18 mcg Inhalation Daily   venlafaxine XR  75 mg Oral Q breakfast    sodium chloride 100 mL/hr at 06/28/22 0520   diltiazem (CARDIZEM) infusion 7.5 mg/hr (06/28/22 0504)    Allergies:  Allergies  Allergen Reactions   Demerol  [Meperidine Hcl]    Demerol [Meperidine] Hives   Jardiance [Empagliflozin] Other (See Comments)    Perineal pain   Prednisone Other (See Comments) and Hypertension    Pt states that this medication puts  him in A-fib    Sulfa Antibiotics Hives   Albuterol Sulfate [Albuterol] Palpitations and Other (See Comments)    Pt currently uses this medication.     Morphine Sulfate Nausea And Vomiting, Rash and Other (See Comments)    Pt states that he is only allergic to the tablet form of this medication.      Social History   Socioeconomic History   Marital status: Widowed    Spouse name: Not on file   Number of children: 1   Years of education: Not on file   Highest education level: 10th grade  Occupational History   Occupation: Disabled   Occupation: on Fish farm manager  Tobacco Use   Smoking status: Former    Packs/day: 3.00    Years: 50.00    Total pack years: 150.00    Types: Cigarettes    Quit date: 04/22/2013    Years since quitting: 9.1   Smokeless tobacco: Never   Tobacco comments:    Reports not smoking for approx 8 years.  Vaping Use   Vaping Use: Never used  Substance and Sexual Activity   Alcohol use: No    Comment: remotely quit alcohol use. Hx of heavy alcohol use.   Drug use: Yes    Types: Marijuana    Comment: occasionally   Sexual activity: Not on file  Other Topics Concern   Not on file  Social History Narrative   Pt lives in [place] with wife.  Does  not routinely exercise.   Social Determinants of Health   Financial Resource Strain: Low Risk  (01/08/2022)   Overall Financial Resource Strain (CARDIA)    Difficulty of Paying Living Expenses: Not hard at all  Food Insecurity: No Food Insecurity (01/08/2022)   Hunger Vital Sign    Worried About Running Out of Food in the Last Year: Never true    Ran Out of Food in the Last Year: Never true  Transportation Needs: No Transportation Needs (01/08/2022)   PRAPARE - Hydrologist (Medical): No    Lack of Transportation (Non-Medical): No  Physical Activity: Insufficiently Active (01/08/2022)   Exercise Vital Sign    Days of Exercise per Week: 3 days    Minutes of Exercise per Session:  20 min  Stress: No Stress Concern Present (01/08/2022)   [place]    Feeling of Stress : Not at all  Social Connections: Moderately Isolated (01/08/2022)   Social Connection and Isolation Panel [NHANES]    Frequency of Communication with Friends and Family: More than three times a week    Frequency of Social Gatherings with Friends and Family: More than three times a week    Attends Religious Services: More than 4 times per year    Active Member of [employer] or Organizations: No    Attends Archivist Meetings: Never    Marital Status: Widowed  Intimate Partner Violence: Not At Risk (01/08/2022)   Humiliation, Afraid, Rape, and Kick questionnaire    Fear of Current or Ex-Partner: No    Emotionally Abused: No    Physically Abused: No    Sexually Abused: No     Family History  Problem Relation Age of Onset   Heart attack Mother    Depression Mother    Heart disease Mother    COPD Mother    Hypertension Mother    Heart attack Father    Diabetes Father    Depression Father    Heart disease Father    Cirrhosis Father    Parkinson's disease Brother      Review of Systems Positive for shortness of breath palpitations chest pain Negative for: General:  chills, fever, night sweats or weight changes.  Cardiovascular: PND orthopnea syncope dizziness  Dermatological skin lesions rashes Respiratory: Cough congestion Urologic: Frequent urination urination at night and hematuria Abdominal: negative for nausea, vomiting, diarrhea, bright red blood per rectum, melena, or hematemesis Neurologic: negative for visual changes, and/or hearing changes  All other systems reviewed and are otherwise negative except as noted above.  Labs: No results for input(s): "CKTOTAL", "CKMB", "TROPONINI" in the last 72 hours. Lab Results  Component Value Date   WBC 10.0 06/28/2022   HGB 17.4 (H) 06/28/2022   HCT 52.7 (H) 06/28/2022    MCV 91.2 06/28/2022   PLT 188 06/28/2022    Recent Labs  Lab 06/28/22 0118 06/28/22 0355  NA 140 139  K 3.4* 3.8  CL 103 102  CO2 30 30  BUN 18 18  CREATININE 1.03 1.02  CALCIUM 10.1 9.8  PROT 7.0  --   BILITOT 0.8  --   ALKPHOS 77  --   ALT 10  --   AST 19  --   GLUCOSE 163* 167*   Lab Results  Component Value Date   CHOL 117 12/23/2021   HDL 29 (L) 12/23/2021   LDLCALC 63 12/23/2021   TRIG 144 12/23/2021   No  results found for: "DDIMER"  Radiology/Studies:  DG Chest Portable 1 View  Result Date: 06/28/2022 CLINICAL DATA:  Chest pain and shortness of breath EXAM: PORTABLE CHEST 1 VIEW COMPARISON:  06/05/2022 FINDINGS: Cardiac shadow is stable. Postsurgical changes are again noted. Mild atelectatic changes are noted in the bases. No sizable effusion or confluent infiltrate is noted no bony abnormality is noted. IMPRESSION: Mild bibasilar atelectatic changes. Electronically Signed   By: Inez Catalina M.D.   On: 06/28/2022 02:20   DG Chest 2 View  Result Date: 06/05/2022 CLINICAL DATA:  Midsternal chest pain EXAM: CHEST - 2 VIEW COMPARISON:  04/26/2021 FINDINGS: Prior median sternotomy. Cardiac and mediastinal contours are within normal limits. Aortic atherosclerosis. No new focal pulmonary opacity. No pleural effusion or pneumothorax. No acute osseous abnormality. IMPRESSION: No acute cardiopulmonary process. Electronically Signed   By: Merilyn Baba M.D.   On: 06/05/2022 19:08   DG Shoulder Right  Result Date: 06/05/2022 CLINICAL DATA:  Fall. Unable to bear weight. Right shoulder pain for 2 weeks. EXAM: RIGHT SHOULDER - 2+ VIEW COMPARISON:  Right shoulder radiographs 01/28/2009 FINDINGS: Mild acromioclavicular joint space narrowing and peripheral osteophytosis. The humeral head is high-riding and nearly contacts the undersurface of the acromion. Mild inferior humeral head-neck junction degenerative osteophytosis. Mild peripheral glenoid degenerative osteophytosis. Partial  visualization of median sternotomy wires. No acute fracture or dislocation. IMPRESSION: Mild acromioclavicular and glenohumeral osteoarthritis. Electronically Signed   By: Yvonne Kendall M.D.   On: 06/05/2022 12:03    EKG: Atrial fibrillation rhythm rapid ventricular rate nonspecific ST changes Chest x-ray shows atelectasis but no evidence of congestive heart failure Weights: Filed Weights   06/28/22 0111  Weight: 85.4 kg     Physical Exam: Blood pressure 104/75, pulse 61, temperature 98.5 F (36.9 C), temperature source Oral, resp. rate 13, height _0  (1.702 m), weight 85.4 kg, SpO2 93 %. Body mass index is 29.48 kg/m. General: Well developed, well nourished, in no acute distress. Head eyes ears nose throat: Normocephalic, atraumatic, sclera non-icteric, no xanthomas, nares are without discharge. No apparent thyromegaly and/or mass  Lungs: Normal respiratory effort.  Some wheezes, few basilar rales, no rhonchi.  Heart: RRR with normal S1 S2. no murmur gallop, no rub, PMI is normal size and placement, carotid upstroke normal without bruit, jugular venous pressure is normal Abdomen: Soft, non-tender, non-distended with normoactive bowel sounds. No hepatomegaly. No rebound/guarding. No obvious abdominal masses. Abdominal aorta is normal size without bruit Extremities: Trace edema. no cyanosis, no clubbing, no ulcers  Peripheral : 2+ bilateral upper extremity pulses, 2+ bilateral femoral pulses, 2+ bilateral dorsal pedal pulse Neuro: Alert and oriented. No facial asymmetry. No focal deficit. Moves all extremities spontaneously. Musculoskeletal: Normal muscle tone without kyphosis Psych:  Responds to questions appropriately with a normal affect.    Assessment: 66 year old male with hypertension hyperlipidemia coronary disease status post coronary bypass graft sleep apnea and paroxysmal nonvalvular atrial fibrillation for which she has recently started amiodarone and has continued  anticoagulation without gaps having acute shortness of breath and chest discomfort likely secondary to atrial fibrillation with rapid ventricular rate.  Plan: 1.  Continue medication management including guideline medical therapy as before for treatment chronic given systolic dysfunction congestive heart failure 2.  Continuation of anticoagulation for further risk reduction in stroke with atrial fibrillation without gap with Eliquis 5 mg twice per day 3.  Continuation of amiodarone increasing to 400 mg a day for electrical cardioversion of atrial fibrillation to this rhythm next 4.  No changes in other medication guideline therapy for hypertension hyperlipidemia and diabetes as before.  Signed, Corey Skains M.D. [facility] 06/28/2022, 2:13 PM

## 2022-06-28 NOTE — Hospital Course (Addendum)
Taken from H&P.  Lawrence [name] is a 66 y.o. Caucasian male with medical history significant for prior atrial fibrillation on Eliquis, asthma, coronary artery disease, celiac disease, COPD, DDD, and chronic diastolic CHF, who presented to the emergency room with acute onset of midsternal chest pain felt as pressure and graded 8-9/10 in severity with with associated radiation to left arm as well as nausea without vomiting or diaphoresis.  The patient has been having cough productive of greenish sputum as well as wheezing with mild dyspnea.  No fever or chills.  No headache or dizziness or blurred vision. ED Course: When he came to the ER BP was 144/112 with heart rate of 114 and pulse symmetry 94% on 3 L of O2 by nasal cannula.  Labs revealed blood glucose of 163 and potassium 3.4 with otherwise normal CMP.  High sensitive troponin I was 27/28 and CBC showed hemoconcentration.  COVID-19 PCR came back negative UA showed more than 500 glucose 100 protein   Imaging: Portable chest ray showed mild bibasal atelectatic changes.  EKG shows A-fib with RVR, patient was given Cardizem bolus followed by infusion.  Cardiology was consulted.  8/13: Heart rate improved with Cardizem infusion.  Cardiology is planning cardioversion tomorrow.  Increasing the dose of amiodarone.  Currently on baseline oxygen requirement of 2 L.  8/14: Patient had 2 unsuccessful attempts today for cardioversion.  Remained in A-fib.  Rate seems controlled. Cardiology is recommending continuation of amiodarone at high doses along with metoprolol.  He will also continue anticoagulation and need to have a close follow-up appointment with cardiology for further recommendations. Will get benefit from EP evaluation for ablation if needed-cardiology will arrange. Per cardiology recommendations he was ready for discharge but become very dizzy and wobbly during ambulation. We will keep him for another night and will obtain PT evaluation.  8/15:  Patient continued to complain some chest pain which seems atypical and dizziness with ambulation.  Orthostatic vitals negative.  We obtained PT evaluation and they recommended home health services which were ordered. Discussed with cardiology and they are recommending discharge with current medications and there is no further cardiac work-up needed.  Remained in A-fib with well-controlled heart rate. He need to have a close follow-up with his cardiologist for further recommendations.  Patient will continue current medication and follow-up with his providers.  Patient will continue on current medication and will follow-up with his providers for further management.

## 2022-06-28 NOTE — Assessment & Plan Note (Addendum)
-   We will continue torsemide, lisinopril and [place]

## 2022-06-28 NOTE — ED Triage Notes (Signed)
Pt arrives from home via [facility], c/o mid to left CP 8 1/2 and SHOB.  HR 90-150 Afib per EMS.  HX COPD, AFIB, Dble Bypass. Pt on 3L [place] at 93% O2, baseline is 2L chronic.

## 2022-06-28 NOTE — H&P (Signed)
Home     [place]   PATIENT NAME: Lawrence [name]    MR#:  [id number]  DATE OF BIRTH:  1956/08/09  DATE OF ADMISSION:  06/28/2022  PRIMARY CARE PHYSICIAN: Birdie Sons, MD   Patient is coming from: Home  REQUESTING/REFERRING PHYSICIAN: Vladimir Crofts, MD  CHIEF COMPLAINT:   Chief Complaint  Patient presents with   Chest Pain    HISTORY OF PRESENT ILLNESS:  Lawrence [name] is a 66 y.o. Caucasian male with medical history significant for prior atrial fibrillation on Eliquis, asthma, coronary artery disease, celiac disease, COPD, DDD, and chronic diastolic CHF, who presented to the emergency room with acute onset of midsternal chest pain felt as pressure and graded 8-9/10 in severity with with associated radiation to left arm as well as nausea without vomiting or diaphoresis.  The patient has been having cough productive of greenish sputum as well as wheezing with mild dyspnea.  No fever or chills.  No headache or dizziness or blurred vision.  ED Course: When he came to the ER BP was 144/112 with heart rate of 114 and pulse symmetry 94% on 3 L of O2 by nasal cannula.  Labs revealed blood glucose of 163 and potassium 3.4 with otherwise normal CMP.  High sensitive troponin I was 27/28 and CBC showed hemoconcentration.  COVID-19 PCR came back negative UA showed more than 500 glucose 100 protein  Imaging: Portable chest ray showed mild bibasal atelectatic changes.  The patient was given 10 mg of IV Cardizem bolus followed by IV drip, 25 mcg of IV fentanyl, 125 mg of IV Solu-Medrol and 4 mg of IV morphine sulfate.  He will be admitted to progressive unit bed for further evaluation and management. PAST MEDICAL HISTORY:   Past Medical History:  Diagnosis Date   A-fib ([place])    Anemia    Anginal pain ([place])    Anxiety    Arthritis    Asthma    CAD (coronary artery disease)    a. 2002 CABGx2 (LIMA->LAD, VG->VG->OM1);  b. 09/2012 DES->OM;  c. 03/2015 PTCA of LAD [facility]) in setting of  atretic LIMA; d. 05/2015 Cath [facility]): nonobs dzs; e. 06/2015 Cath ([facility]): LM nl, LAD 45p/d ISR, 50d, D1/2 small, LCX 50p/d ISR, OM1 70ost, 30 ISR, VG->OM1 50ost, 77m LIMA->LAD 99p/d - atretic, RCA dom, nl; f.cath 10/16: 40-50%(FFR 0.90) pLAD, 75% (FFR 0.77) mLAD s/p PCI/DES, oRCA 40% (FFR0.95)   Cancer (HCC)    SKIN CANCER ON BACK   Celiac disease    Chronic diastolic CHF (congestive heart failure) ([place]    a. 06/2009 Echo: EF 60-65%, Gr 1 DD, triv AI, mildly dil LA, nl RV.   COPD (chronic obstructive pulmonary disease) ([place]    a. Chronic bronchitis and emphysema.   DDD (degenerative disc disease), lumbar    Diverticulosis    Dysrhythmia    Essential hypertension    GERD (gastroesophageal reflux disease)    History of hiatal hernia    History of kidney stones    H/O   History of tobacco abuse    a. Quit 2014.   Myocardial infarction ([facility] 2002   4 STENTS   Pancreatitis    PSVT (paroxysmal supraventricular tachycardia) ([place]    a. 10/2012 Noted on Zio Patch.   Sleep apnea    LOST CORD TO CPAP -ONLY 02 @ BEDTIME   Tubular adenoma of colon    Type II diabetes mellitus ([place]     PAST SURGICAL HISTORY:   Past  Surgical History:  Procedure Laterality Date   BYPASS GRAFT     CARDIAC CATHETERIZATION N/A 07/12/2015   rocedure: Left Heart Cath and Cors/Grafts Angiography;  Surgeon: Belva Crome, MD;  Location: [place] [facility];  Service: Cardiovascular;  Laterality: N/A;   CARDIAC CATHETERIZATION Right 10/07/2015   Procedure: Left Heart Cath and Cors/Grafts Angiography;  Surgeon: Dionisio David, MD;  Location: [place] [facility] LAB;  Service: Cardiovascular;  Laterality: Right;   CARDIAC CATHETERIZATION N/A 04/06/2016   Procedure: Left Heart Cath and Coronary Angiography;  Surgeon: Yolonda Kida, MD;  Location: [place] [facility] LAB;  Service: Cardiovascular;  Laterality: N/A;   CARDIAC CATHETERIZATION  04/06/2016   Procedure: Bypass Graft Angiography;  Surgeon: Yolonda Kida, MD;   Location: [place] CV LAB;  Service: Cardiovascular;;   CARDIAC CATHETERIZATION N/A 11/02/2016   Procedure: Left Heart Cath and Cors/Grafts Angiography and possible PCI;  Surgeon: Yolonda Kida, MD;  Location: [place] CV LAB;  Service: Cardiovascular;  Laterality: N/A;   CARDIAC CATHETERIZATION N/A 11/02/2016   Procedure: Coronary Stent Intervention;  Surgeon: Yolonda Kida, MD;  Location: [place] CV LAB;  Service: Cardiovascular;  Laterality: N/A;   CHOLECYSTECTOMY     CIRCUMCISION N/A 06/09/2019   Procedure: CIRCUMCISION ADULT;  Surgeon: Billey Co, MD;  Location: [facility] ORS;  Service: Urology;  Laterality: N/A;   COLONOSCOPY WITH PROPOFOL N/A 04/01/2018   Procedure: COLONOSCOPY WITH PROPOFOL;  Surgeon: Manya Silvas, MD;  Location: [facility] ENDOSCOPY;  Service: Endoscopy;  Laterality: N/A;   ESOPHAGEAL DILATION     ESOPHAGOGASTRODUODENOSCOPY (EGD) WITH PROPOFOL N/A 04/01/2018   Procedure: ESOPHAGOGASTRODUODENOSCOPY (EGD) WITH PROPOFOL;  Surgeon: Manya Silvas, MD;  Location: [facility] ENDOSCOPY;  Service: Endoscopy;  Laterality: N/A;   LEFT HEART CATH AND CORS/GRAFTS ANGIOGRAPHY N/A 06/12/2019   Procedure: LEFT HEART CATH AND CORS/GRAFTS ANGIOGRAPHY;  Surgeon: Teodoro Spray, MD;  Location: [place] [facility] LAB;  Service: Cardiovascular;  Laterality: N/A;   LEFT HEART CATH AND CORS/GRAFTS ANGIOGRAPHY N/A 03/11/2020   Procedure: LEFT HEART CATH AND CORS/GRAFTS ANGIOGRAPHY;  Surgeon: Isaias Cowman, MD;  Location: [place] [facility] LAB;  Service: Cardiovascular;  Laterality: N/A;   LEFT HEART CATH AND CORS/GRAFTS ANGIOGRAPHY N/A 05/01/2021   Procedure: LEFT HEART CATH AND CORS/GRAFTS ANGIOGRAPHY;  Surgeon: Corey Skains, MD;  Location: [place] [facility] LAB;  Service: Cardiovascular;  Laterality: N/A;   TONSILLECTOMY     VASCULAR SURGERY      SOCIAL HISTORY:   Social History   Tobacco Use   Smoking status: Former    Packs/day: 3.00    Years: 50.00     Total pack years: 150.00    Types: Cigarettes    Quit date: 04/22/2013    Years since quitting: 9.1   Smokeless tobacco: Never   Tobacco comments:    Reports not smoking for approx 8 years.  Substance Use Topics   Alcohol use: No    Comment: remotely quit alcohol use. Hx of heavy alcohol use.    FAMILY HISTORY:   Family History  Problem Relation Age of Onset   Heart attack Mother    Depression Mother    Heart disease Mother    COPD Mother    Hypertension Mother    Heart attack Father    Diabetes Father    Depression Father    Heart disease Father    Cirrhosis Father    Parkinson's disease Brother     DRUG ALLERGIES:  Allergies  Allergen Reactions   Demerol  [Meperidine Hcl]    Demerol [Meperidine] Hives   Jardiance [Empagliflozin] Other (See Comments)    Perineal pain   Prednisone Other (See Comments) and Hypertension    Pt states that this medication puts him in A-fib    Sulfa Antibiotics Hives   Albuterol Sulfate [Albuterol] Palpitations and Other (See Comments)    Pt currently uses this medication.     Morphine Sulfate Nausea And Vomiting, Rash and Other (See Comments)    Pt states that he is only allergic to the tablet form of this medication.      REVIEW OF SYSTEMS:   ROS As per history of present illness. All pertinent systems were reviewed above. Constitutional, HEENT, cardiovascular, respiratory, GI, GU, musculoskeletal, neuro, psychiatric, endocrine, integumentary and hematologic systems were reviewed and are otherwise negative/unremarkable except for positive findings mentioned above in the HPI.   MEDICATIONS AT HOME:   Prior to Admission medications   Medication Sig Start Date End Date Taking? Authorizing Provider  Accu-Chek Softclix Lancets lancets Use as instructed to check sugar daily for type 2 diabetes. 03/03/21   Birdie Sons, MD  albuterol (VENTOLIN HFA) 108 (90 Base) MCG/ACT inhaler INHALE 2 PUFFS BY MOUTH EVERY 6 HOURS AS NEEDED FOR  SHORTNESS OF BREATH 12/02/21   Birdie Sons, MD  allopurinol (ZYLOPRIM) 300 MG tablet TAKE 1 TABLET BY MOUTH TWICE A DAY 10/15/21   Birdie Sons, MD  ALPRAZolam Duanne Moron) 1 MG tablet Take 1 tablet (1 mg total) by mouth 3 (three) times daily. 06/18/22   Birdie Sons, MD  amiodarone (PACERONE) 200 MG tablet Take 200 mg by mouth daily. 06/04/22   [provider]  amLODipine (NORVASC) 2.5 MG tablet TAKE 1 TABLET BY MOUTH DAILY 03/02/22   Birdie Sons, MD  aspirin 81 MG chewable tablet Chew 81 mg by mouth daily. Swallows whole    [provider]  atorvastatin (LIPITOR) 80 MG tablet TAKE 1 TABLET BY MOUTH AT BEDTIME 04/16/22   Birdie Sons, MD  BELSOMRA 5 MG TABS TAKE 1 TABLET (5 MG TOTAL) BY MOUTH AT BEDTIME AS NEEDED 06/17/22   Birdie Sons, MD  Blood Glucose Calibration (ACCU-CHEK GUIDE CONTROL) LIQD Use with blood glucose monitor as directed 02/20/21   Birdie Sons, MD  Blood Glucose Monitoring Suppl (ACCU-CHEK GUIDE) w/Device KIT Use to check blood sugars as directed 02/20/21   Birdie Sons, MD  celecoxib (CELEBREX) 200 MG capsule Take 1 capsule by mouth 2 (two) times daily. 11/03/21   [provider]  cetirizine (ZYRTEC) 10 MG tablet TAKE 1 TABLET BY MOUTH AT BEDTIME 04/16/22   Birdie Sons, MD  docusate sodium (COLACE) 100 MG capsule Take 100 mg by mouth daily as needed for mild constipation.    [provider]  ELIQUIS 5 MG TABS tablet TAKE 1 TABLET BY MOUTH TWICE A DAY 05/26/21   Birdie Sons, MD  FARXIGA 10 MG TABS tablet TAKE 1 TABLET BY MOUTH DAILY 02/09/22   Birdie Sons, MD  fluticasone furoate-vilanterol (BREO ELLIPTA) 100-25 MCG/INH AEPB INHALE 1 PUFF BY MOUTH INTO THE LUNGS DAILY. 07/18/21   Birdie Sons, MD  glucose blood (ACCU-CHEK GUIDE) test strip Use as instructed to check sugar daily for type 2 diabetes. 03/03/21   Birdie Sons, MD  isosorbide mononitrate (IMDUR) 120 MG 24 hr tablet TAKE 1 TABLET BY MOUTH DAILY  08/20/21   Birdie Sons, MD  isosorbide mononitrate (IMDUR) 60 MG 24 hr tablet Take by mouth. 06/13/20   [provider]  linaclotide (LINZESS) 72 MCG capsule Take 72 mcg by mouth daily before breakfast.     [provider]  lisinopril (ZESTRIL) 10 MG tablet TAKE 1 TABLET BY MOUTH DAILY 09/17/21   Birdie Sons, MD  magnesium oxide (MAG-OX) 400 MG tablet Take 1 tablet by mouth daily. 03/23/17   [provider]  metFORMIN (GLUCOPHAGE-XR) 500 MG 24 hr tablet TAKE 1 TABLET BY MOUTH TWICE A DAY 08/28/21   Birdie Sons, MD  methocarbamol (ROBAXIN) 500 MG tablet TAKE 1 TABLET BY MOUTH EVERY 8 HOURS AS NEEDED FOR MUSCLE SPASMS 03/02/22   Birdie Sons, MD  metoprolol tartrate (LOPRESSOR) 25 MG tablet Take 25 mg by mouth 2 (two) times daily. 06/27/22   [provider]  naloxone [facility]) nasal spray 4 mg/0.1 mL 1 spray into nostril x1 and may repeat every 2-3 minutes until patient is responsive or EMS arrives 08/21/20   Birdie Sons, MD  nitroGLYCERIN (NITROSTAT) 0.4 MG SL tablet PLACE ONE TABLET UNDER THE TONGUE EVERY 5 MINUTES FOR CHEST PAIN. IF NO RELIEF PAST 3RD TAB GO TO EMERGENCY ROOM 12/02/21   Birdie Sons, MD  omega-3 acid ethyl esters (LOVAZA) 1 g capsule TAKE 4 CAPSULES BY MOUTH DAILY 08/20/21   Birdie Sons, MD  omeprazole (PRILOSEC) 40 MG capsule TAKE 1 CAPSULE BY MOUTH 2 TIMES DAILY 30 MINUTES BEFORE BREAKFAST AND DINNER 03/10/22   Birdie Sons, MD  ondansetron (ZOFRAN-ODT) 4 MG disintegrating tablet Take 1 tablet (4 mg total) by mouth every 8 (eight) hours as needed for nausea or vomiting. 04/08/22   Birdie Sons, MD  oxyCODONE-acetaminophen (PERCOCET) 10-325 MG tablet TAKE 1 TABLET BY MOUTH EVERY 4 HOURS AS NEEDED FOR PAIN 06/17/22   Birdie Sons, MD  ranolazine (RANEXA) 1000 MG SR tablet Take 1 tablet (1,000 mg total) by mouth 2 (two) times daily. 04/02/21   Birdie Sons, MD  SPIRIVA HANDIHALER 18 MCG inhalation capsule PLACE 1  CAPSULE INTO INHALER AND INHALE BY MOUTH ONCE A DAY. 06/02/22   Birdie Sons, MD  torsemide (DEMADEX) 100 MG tablet Take 0.5 tablets (50 mg total) by mouth daily. 05/13/20   Birdie Sons, MD  traZODone (DESYREL) 150 MG tablet TAKE 1 TABLET BY MOUTH AT BEDTIME 09/01/21   Birdie Sons, MD  venlafaxine XR (EFFEXOR-XR) 75 MG 24 hr capsule TAKE 1 CAPSULE BY MOUTH DAILY WITH BREAKFAST 12/16/21   Birdie Sons, MD      VITAL SIGNS:  Blood pressure 126/80, pulse 63, temperature 97.7 F (36.5 C), temperature source Oral, resp. rate 15, height 5' 7"  (1.702 m), weight 85.4 kg, SpO2 94 %.  PHYSICAL EXAMINATION:  Physical Exam  GENERAL:  66 y.o.-year-old patient lying in the bed with no acute distress.  EYES: Pupils equal, round, reactive to light and accommodation. No scleral icterus. Extraocular muscles intact.  HEENT: Head atraumatic, normocephalic. Oropharynx and nasopharynx clear.  NECK:  Supple, no jugular venous distention. No thyroid enlargement, no tenderness.  LUNGS: Normal breath sounds bilaterally, no wheezing, rales,rhonchi or crepitation. No use of accessory muscles of respiration.  CARDIOVASCULAR: Irregularly irregular rhythm occasionally tachycardic rhythm, S1, S2 normal. No murmurs, rubs, or gallops.  ABDOMEN: Soft, nondistended, nontender. Bowel sounds present. No organomegaly or mass.  EXTREMITIES: No pedal edema, cyanosis, or clubbing.  NEUROLOGIC: Cranial nerves II through XII are intact. Muscle strength 5/5  in all extremities. Sensation intact. Gait not checked.  PSYCHIATRIC: The patient is alert and oriented x 3.  Normal affect and good eye contact. SKIN: No obvious rash, lesion, or ulcer.   LABORATORY PANEL:   CBC Recent Labs  Lab 06/28/22 0355  WBC 10.0  HGB 17.4*  HCT 52.7*  PLT 188   ------------------------------------------------------------------------------------------------------------------  Chemistries  Recent Labs  Lab 06/28/22 0118  06/28/22 0355  NA 140 139  K 3.4* 3.8  CL 103 102  CO2 30 30  GLUCOSE 163* 167*  BUN 18 18  CREATININE 1.03 1.02  CALCIUM 10.1 9.8  MG 1.9  --   AST 19  --   ALT 10  --   ALKPHOS 77  --   BILITOT 0.8  --    ------------------------------------------------------------------------------------------------------------------  Cardiac Enzymes No results for input(s): "TROPONINI" in the last 168 hours. ------------------------------------------------------------------------------------------------------------------  RADIOLOGY:  DG Chest Portable 1 View  Result Date: 06/28/2022 CLINICAL DATA:  Chest pain and shortness of breath EXAM: PORTABLE CHEST 1 VIEW COMPARISON:  06/05/2022 FINDINGS: Cardiac shadow is stable. Postsurgical changes are again noted. Mild atelectatic changes are noted in the bases. No sizable effusion or confluent infiltrate is noted no bony abnormality is noted. IMPRESSION: Mild bibasilar atelectatic changes. Electronically Signed   By: Inez Catalina M.D.   On: 06/28/2022 02:20      IMPRESSION AND PLAN:  Assessment and Plan: * Atrial fibrillation with RVR ([place]) - The patient will be admitted to a progressive unit bed. - We will continue him on IV Cardizem drip. - We will optimize electrolytes. - Cardiology consult will be obtained. - I notified Dr. [name] about the patient  Hypokalemia Potassium will be replaced and magnesium level will be checked.  Chest pain - We will follow serial troponins. - Patient will be placed on as needed sublingual nitroglycerin and IV morphine sulfate for pain. -This is likely  atypical and related to his atrial fibrillation with RVR.   Acute bronchitis - Patient will be given mucolytics and bronchodilator therapy as needed.  This is likely viral. - She has no evidence for pneumonia on chest x-ray.  Dyslipidemia -We will continue statin therapy.  Essential hypertension - We will continue his antihypertensives.  Chronic  diastolic HF (heart failure) (HCC) - We will continue Lasix, lisinopril and Farxiga   DVT prophylaxis: Eliquis.   Advanced Care Planning:  Code Status: full code.  Family Communication:  The plan of care was discussed in details with the patient (and family). I answered all questions. The patient agreed to proceed with the above mentioned plan. Further management will depend upon hospital course. Disposition Plan: Back to previous home environment Consults called: Cardiology. All the records are reviewed and case discussed with ED provider.  Status is: Inpatient   At the time of the admission, it appears that the appropriate admission status for this patient is inpatient.  This is judged to be reasonable and necessary in order to provide the required intensity of service to ensure the patient's safety given the presenting symptoms, physical exam findings and initial radiographic and laboratory data in the context of comorbid conditions.  The patient requires inpatient status due to high intensity of service, high risk of further deterioration and high frequency of surveillance required.  I certify that at the time of admission, it is my clinical judgment that the patient will require inpatient hospital care extending more than 2 midnights.  Dispo: The patient is from: Home              Anticipated d/c is to: Home              Patient currently is not medically stable to d/c.              Difficult to place patient: No  Christel Mormon M.D on 06/28/2022 at 5:47 AM  [facility]   From 7 PM-7 AM, contact night-coverage [email or web address]  CC: Primary care physician; Birdie Sons, MD

## 2022-06-28 NOTE — ED Provider Notes (Signed)
[facility] Provider Note    Event Date/Time   First MD Initiated Contact with Patient 06/28/22 0105     (approximate)   History   Chest Pain   HPI  DIA JEFFERYS is a 66 y.o. male who presents to the ED for evaluation of Chest Pain   I reviewed cardiology clinic visit from 7/20.  History of CAD s/p CABG x2 in 7902, complicated by coronary artery dissection.  Diastolic dysfunction, paroxysmal A-fib, COPD and tobacco abuse, obesity.  Anticoagulated on Eliquis.  Patient presents to the ED from home for the ration of chest pain, palpitations, dyspnea and productive cough for the past 1 day.  Reports the symptoms started around 12 PM on 8/12 while seated.  He has had consistent chest pain since that time and alongside the sensation of dyspnea and palpitations.  Reports 1-2 days of her productive cough with increased sputum production from his baseline.  No antibiotics recently or steroids.  Physical Exam   Triage Vital Signs: ED Triage Vitals  Enc Vitals Group     BP 06/28/22 0108 (!) 144/112     Pulse Rate 06/28/22 0108 (!) 114     Resp 06/28/22 0108 13     Temp 06/28/22 0108 98.4 F (36.9 C)     Temp Source 06/28/22 0108 Oral     SpO2 06/28/22 0102 93 %     Weight 06/28/22 0111 188 lb 3.2 oz (85.4 kg)     Height 06/28/22 0111 5' 7"  (1.702 m)     Head Circumference --      Peak Flow --      Pain Score 06/28/22 0110 9     Pain Loc --      Pain Edu? --      Excl. in [place]? --     Most recent vital signs: Vitals:   06/28/22 0300 06/28/22 0400  BP: 119/88 126/80  Pulse: 60 63  Resp: 18 15  Temp:    SpO2: 95% 94%    General: Awake, no distress.  Seems uncomfortable, occasional wet-sounding cough is noted throughout my evaluation. CV:  Good peripheral perfusion.  Tachycardic and irregular Resp:   Mild tachypnea to the low/mid 20s, expiratory wheezes are noted. Abd:  No distention.  Soft and benign MSK:  No deformity noted.  No gross edema or  pitting edema appreciated Neuro:  No focal deficits appreciated. Other:     ED Results / Procedures / Treatments   Labs (all labs ordered are listed, but only abnormal results are displayed) Labs Reviewed  COMPREHENSIVE METABOLIC PANEL - Abnormal; Notable for the following components:      Result Value   Potassium 3.4 (*)    Glucose, Bld 163 (*)    All other components within normal limits  CBC WITH DIFFERENTIAL/PLATELET - Abnormal; Notable for the following components:   RBC 5.95 (*)    Hemoglobin 17.7 (*)    HCT 54.2 (*)    Monocytes Absolute 1.1 (*)    All other components within normal limits  URINALYSIS, ROUTINE W REFLEX MICROSCOPIC - Abnormal; Notable for the following components:   Color, Urine YELLOW (*)    APPearance CLEAR (*)    Specific Gravity, Urine 1.038 (*)    Glucose, UA >=500 (*)    Protein, ur 100 (*)    Crystals PRESENT (*)    All other components within normal limits  BASIC METABOLIC PANEL - Abnormal; Notable for the following components:  Glucose, Bld 167 (*)    All other components within normal limits  CBC - Abnormal; Notable for the following components:   Hemoglobin 17.4 (*)    HCT 52.7 (*)    All other components within normal limits  TROPONIN I (HIGH SENSITIVITY) - Abnormal; Notable for the following components:   Troponin I (High Sensitivity) 27 (*)    All other components within normal limits  SARS CORONAVIRUS 2 BY RT PCR  BRAIN NATRIURETIC PEPTIDE  MAGNESIUM  LIPASE, BLOOD  HIV ANTIBODY (ROUTINE TESTING W REFLEX)  TROPONIN I (HIGH SENSITIVITY)    EKG A-fib with RVR with a rate of 124 bpm.  Normal axis and intervals.  Inferior T wave inversions without STEMI.  RADIOLOGY 1 view CXR interpreted by me with left greater than right bibasilar atelectatic versus infiltrative changes  Official radiology report(s): DG Chest Portable 1 View  Result Date: 06/28/2022 CLINICAL DATA:  Chest pain and shortness of breath EXAM: PORTABLE CHEST 1  VIEW COMPARISON:  06/05/2022 FINDINGS: Cardiac shadow is stable. Postsurgical changes are again noted. Mild atelectatic changes are noted in the bases. No sizable effusion or confluent infiltrate is noted no bony abnormality is noted. IMPRESSION: Mild bibasilar atelectatic changes. Electronically Signed   By: Inez Catalina M.D.   On: 06/28/2022 02:20    PROCEDURES and INTERVENTIONS:  .1-3 Lead EKG Interpretation  Performed by: Vladimir Crofts, MD Authorized by: Vladimir Crofts, MD     Interpretation: abnormal     ECG rate:  131   ECG rate assessment: tachycardic     Rhythm: atrial fibrillation     Ectopy: none     Conduction: normal   .Critical Care  Performed by: Vladimir Crofts, MD Authorized by: Vladimir Crofts, MD   Critical care provider statement:    Critical care time (minutes):  30   Critical care time was exclusive of:  Separately billable procedures and treating other patients   Critical care was necessary to treat or prevent imminent or life-threatening deterioration of the following conditions:  Circulatory failure, cardiac failure and respiratory failure   Critical care was time spent personally by me on the following activities:  Development of treatment plan with patient or surrogate, discussions with consultants, evaluation of patient's response to treatment, examination of patient, ordering and review of laboratory studies, ordering and review of radiographic studies, ordering and performing treatments and interventions, pulse oximetry, re-evaluation of patient's condition and review of old charts   Medications  diltiazem (CARDIZEM) 125 mg in dextrose 5% 125 mL (1 mg/mL) infusion (5 mg/hr Intravenous New Bag/Given 06/28/22 0203)  azithromycin (ZITHROMAX) 500 mg in sodium chloride 0.9 % 250 mL IVPB (500 mg Intravenous New Bag/Given 06/28/22 0347)  allopurinol (ZYLOPRIM) tablet 300 mg (has no administration in time range)  atorvastatin (LIPITOR) tablet 80 mg (has no administration in  time range)  amLODipine (NORVASC) tablet 2.5 mg (has no administration in time range)  isosorbide mononitrate (IMDUR) 24 hr tablet 120 mg (has no administration in time range)  lisinopril (ZESTRIL) tablet 10 mg (has no administration in time range)  nitroGLYCERIN (NITROSTAT) SL tablet 0.4 mg (has no administration in time range)  omega-3 acid ethyl esters (LOVAZA) capsule 4 g (has no administration in time range)  0.9 % NaCl with KCl 20 mEq/ L  infusion (has no administration in time range)  acetaminophen (TYLENOL) tablet 650 mg (has no administration in time range)    Or  acetaminophen (TYLENOL) suppository 650 mg (has no administration in time range)  traZODone (DESYREL) tablet 25 mg (has no administration in time range)  magnesium hydroxide (MILK OF MAGNESIA) suspension 30 mL (has no administration in time range)  ondansetron (ZOFRAN) tablet 4 mg (has no administration in time range)    Or  ondansetron (ZOFRAN) injection 4 mg (has no administration in time range)  guaiFENesin (MUCINEX) 12 hr tablet 600 mg (600 mg Oral Given 06/28/22 0350)  chlorpheniramine-HYDROcodone 10-8 MG/5ML suspension 5 mL (has no administration in time range)  aspirin EC tablet 81 mg (has no administration in time range)  oxyCODONE (Oxy IR/ROXICODONE) immediate release tablet 10 mg (has no administration in time range)    And  acetaminophen (TYLENOL) tablet 325 mg (has no administration in time range)  diltiazem (CARDIZEM) injection 10 mg (10 mg Intravenous Given 06/28/22 0158)  methylPREDNISolone sodium succinate (SOLU-MEDROL) 125 mg/2 mL injection 125 mg (125 mg Intravenous Given 06/28/22 0156)  fentaNYL (SUBLIMAZE) injection 25 mcg (25 mcg Intravenous Given 06/28/22 0157)  morphine (PF) 4 MG/ML injection 4 mg (4 mg Intravenous Given 06/28/22 0301)  cefTRIAXone (ROCEPHIN) 1 g in sodium chloride 0.9 % 100 mL IVPB (0 g Intravenous Stopped 06/28/22 0341)     IMPRESSION / MDM / ASSESSMENT AND PLAN / ED COURSE  I  reviewed the triage vital signs and the nursing notes.  Differential diagnosis includes, but is not limited to, ACS, PTX, PNA, muscle strain/spasm, PE, dissection, sepsis, COPD exacerbation, pneumonia  {Patient presents with symptoms of an acute illness or injury that is potentially life-threatening.  66 year old male presents to the ED with A-fib with RVR and evidence of COPD exacerbation with possible pneumonia, requiring medical admission.  Remains hemodynamically stable with rapid rates in atrial fibrillation, improved with diltiazem bolus and drip.  Blood work is reassuring with no leukocytosis or significant metabolic derangements.  Troponin is marginally elevated and possibly due to his respiratory status and rapid heart rates.  He is not grossly volume overload.  Considering his productive cough and COPD, provided antibiotic coverage alongside steroids.  Withheld breathing treatments while he was tachycardic.  Consulted with medicine who agrees to admit.  Clinical Course as of 06/28/22 0423  Sun Jun 28, 2022  0301 reassessed [DS]    Clinical Course User Index [DS] Vladimir Crofts, MD     FINAL CLINICAL IMPRESSION(S) / ED DIAGNOSES   Final diagnoses:  Atrial fibrillation with RVR ([place])  COPD exacerbation ([place])  Productive cough  Other chest pain     Rx / DC Orders   ED Discharge Orders          Ordered    Amb referral to [facility]        06/28/22 0329             Note:  This document was prepared using Dragon voice recognition software and may include unintentional dictation errors.   Vladimir Crofts, MD 06/28/22 [id number]

## 2022-06-28 NOTE — Progress Notes (Signed)
Progress Note   Patient: Lawrence [name] [id number] DOB: Mar 26, 1956 DOA: 06/28/2022     0 DOS: the patient was seen and examined on 06/28/2022   Brief hospital course: Taken from H&P.  KWADWO TARAS is a 66 y.o. Caucasian male with medical history significant for prior atrial fibrillation on Eliquis, asthma, coronary artery disease, celiac disease, COPD, DDD, and chronic diastolic CHF, who presented to the emergency room with acute onset of midsternal chest pain felt as pressure and graded 8-9/10 in severity with with associated radiation to left arm as well as nausea without vomiting or diaphoresis.  The patient has been having cough productive of greenish sputum as well as wheezing with mild dyspnea.  No fever or chills.  No headache or dizziness or blurred vision. ED Course: When he came to the ER BP was 144/112 with heart rate of 114 and pulse symmetry 94% on 3 L of O2 by nasal cannula.  Labs revealed blood glucose of 163 and potassium 3.4 with otherwise normal CMP.  High sensitive troponin I was 27/28 and CBC showed hemoconcentration.  COVID-19 PCR came back negative UA showed more than 500 glucose 100 protein   Imaging: Portable chest ray showed mild bibasal atelectatic changes.  EKG shows A-fib with RVR, patient was given Cardizem bolus followed by infusion.  Cardiology was consulted.  8/13: Heart rate improved with Cardizem infusion.  Cardiology is planning cardioversion tomorrow.  Increasing the dose of amiodarone.  Currently on baseline oxygen requirement of 2 L.   Assessment and Plan: * Atrial fibrillation with RVR ([place]) - The patient will be admitted to a progressive unit bed. - We will continue him on IV Cardizem drip. -Amiodarone was increased to 400 mg by cardiology -Cardioversion tomorrow  Hypokalemia Magnesium at 1.9, potassium was repleted.  Chest pain Troponin barely positive with a flat curve, most likely secondary to A-fib with RVR.  Pain improved continue to  have some pressure. -Continue to monitor   Acute bronchitis - Patient will be given mucolytics and bronchodilator therapy as needed.  This is likely viral. - She has no evidence for pneumonia on chest x-ray.  Chronic diastolic HF (heart failure) (HCC) - We will continue torsemide, lisinopril and Farxiga  Dyslipidemia -We will continue statin therapy.  Essential hypertension - We will continue his antihypertensives.  Chronic respiratory failure with hypoxia (HCC) Currently stable on his baseline oxygen requirement of 2 L. Continue home bronchodilators   Subjective: Patient is feeling little improved when seen during morning rounds.  Continues to have some chest pressure.  He was sleeping comfortably but easily arousable.  Physical Exam: Vitals:   06/28/22 0945 06/28/22 1200 06/28/22 1224 06/28/22 1400  BP: 128/78 94/65  104/75  Pulse: 85 81  61  Resp: 19 16  13   Temp:   98.3 F (36.8 C) 98.5 F (36.9 C)  TempSrc:    Oral  SpO2:  93%  93%  Weight:      Height:       General.  Well-developed gentleman, in no acute distress. Pulmonary.  Lungs clear bilaterally, normal respiratory effort. CV.  Irregularly irregular Abdomen.  Soft, nontender, nondistended, BS positive. CNS.  Alert and oriented .  No focal neurologic deficit. Extremities.  No edema, no cyanosis, pulses intact and symmetrical. Psychiatry.  Judgment and insight appears normal.  Data Reviewed: Prior data reviewed.  Family Communication: Discussed with patient  Disposition: Status is: Inpatient Remains inpatient appropriate because: Severity of illness   Planned Discharge Destination:  Home  DVT prophylaxis.  Eliquis Time spent: 40 minutes  This record has been created using Systems analyst. Errors have been sought and corrected,but may not always be located. Such creation errors do not reflect on the standard of care.  Author: Lorella Nimrod, MD 06/28/2022 3:12 PM  For on call  review [email or web address].

## 2022-06-28 NOTE — Assessment & Plan Note (Addendum)
Troponin barely positive with a flat curve, most likely secondary to A-fib with RVR.  Pain improved continue to have some pressure. -Continue to monitor

## 2022-06-28 NOTE — Assessment & Plan Note (Signed)
>>  ASSESSMENT AND PLAN FOR ESSENTIAL HYPERTENSION WRITTEN ON 06/28/2022  5:39 AM BY MANSY, JAN A, MD  - We will continue his antihypertensives.

## 2022-06-28 NOTE — Assessment & Plan Note (Signed)
>>  ASSESSMENT AND PLAN FOR ATRIAL FIBRILLATION WRITTEN ON 03/01/2023 12:37 PM BY Malva Limes, MD  >>ASSESSMENT AND PLAN FOR ATRIAL FIBRILLATION WITH RVR WRITTEN ON 06/28/2022  3:08 PM BY Arnetha Courser, MD  - The patient will be admitted to a progressive unit bed. - We will continue him on IV Cardizem drip. -Amiodarone was increased to 400 mg by cardiology -Cardioversion tomorrow

## 2022-06-29 ENCOUNTER — Inpatient Hospital Stay: Payer: Medicare HMO | Admitting: Certified Registered Nurse Anesthetist

## 2022-06-29 ENCOUNTER — Encounter: Payer: Self-pay | Admitting: Internal Medicine

## 2022-06-29 ENCOUNTER — Encounter
Admission: EM | Disposition: A | Discharge status: Home Health | Payer: Medicare HMO | Source: Home / Self Care | Attending: Internal Medicine

## 2022-06-29 DIAGNOSIS — I4891 Unspecified atrial fibrillation: Secondary | ICD-10-CM | POA: Diagnosis not present

## 2022-06-29 DIAGNOSIS — I2510 Atherosclerotic heart disease of native coronary artery without angina pectoris: Secondary | ICD-10-CM | POA: Diagnosis not present

## 2022-06-29 DIAGNOSIS — I25798 Atherosclerosis of other coronary artery bypass graft(s) with other forms of angina pectoris: Secondary | ICD-10-CM | POA: Diagnosis not present

## 2022-06-29 DIAGNOSIS — I480 Paroxysmal atrial fibrillation: Secondary | ICD-10-CM | POA: Diagnosis not present

## 2022-06-29 DIAGNOSIS — T7840XA Allergy, unspecified, initial encounter: Secondary | ICD-10-CM | POA: Diagnosis not present

## 2022-06-29 DIAGNOSIS — J449 Chronic obstructive pulmonary disease, unspecified: Secondary | ICD-10-CM | POA: Diagnosis not present

## 2022-06-29 HISTORY — PX: CARDIOVERSION: SHX1299

## 2022-06-29 LAB — BASIC METABOLIC PANEL
Anion gap: 5 (ref 5–15)
BUN: 21 mg/dL (ref 8–23)
CO2: 26 mmol/L (ref 22–32)
Calcium: 9.1 mg/dL (ref 8.9–10.3)
Chloride: 106 mmol/L (ref 98–111)
Creatinine, Ser: 0.74 mg/dL (ref 0.61–1.24)
GFR, Estimated: >60 mL/min (ref >60)
Glucose, Bld: 146 mg/dL — ABNORMAL HIGH (ref 70–99)
Potassium: 4.2 mmol/L (ref 3.5–5.1)
Sodium: 137 mmol/L (ref 135–145)

## 2022-06-29 LAB — CBC
HCT: 45.4 % (ref 39.0–52.0)
Hemoglobin: 14.9 g/dL (ref 13.0–17.0)
MCH: 29.9 pg (ref 26.0–34.0)
MCHC: 32.8 g/dL (ref 30.0–36.0)
MCV: 91.2 fL (ref 80.0–100.0)
Platelets: 176 10*3/uL (ref 150–400)
RBC: 4.98 MIL/uL (ref 4.22–5.81)
RDW: 14.8 % (ref 11.5–15.5)
WBC: 10.7 10*3/uL — ABNORMAL HIGH (ref 4.0–10.5)
nRBC: 0 % (ref 0.0–0.2)

## 2022-06-29 LAB — GLUCOSE, CAPILLARY
Glucose-Capillary: 160 mg/dL — ABNORMAL HIGH (ref 70–99)
Glucose-Capillary: 115 mg/dL — ABNORMAL HIGH (ref 70–99)

## 2022-06-29 SURGERY — CARDIOVERSION
Anesthesia: General

## 2022-06-29 MED ORDER — PROPOFOL 10 MG/ML IV BOLUS
INTRAVENOUS | Status: DC | PRN
  Administered 2022-06-29: 40 mg via INTRAVENOUS
  Administered 2022-06-29: 20 mg via INTRAVENOUS

## 2022-06-29 NOTE — Transfer of Care (Signed)
Immediate Anesthesia Transfer of Care Note  Patient: Lawrence [name]  Procedure(s) Performed: CARDIOVERSION  Patient Location: PACU  Anesthesia Type:General  Level of Consciousness: sedated  Airway & Oxygen Therapy: Patient Spontanous Breathing and Patient connected to nasal cannula oxygen  Post-op Assessment: Report given to RN and Post -op Vital signs reviewed and stable  Post vital signs: Reviewed and stable  Last Vitals:  Vitals Value Taken Time  BP 112/80 06/29/22 0940  Temp    Pulse 78 06/29/22 0940  Resp 16 06/29/22 0940  SpO2 90 % 06/29/22 0940  Vitals shown include unvalidated device data.  Last Pain:  Vitals:   06/29/22 0910  TempSrc:   PainSc: 7          Complications: No notable events documented.

## 2022-06-29 NOTE — Assessment & Plan Note (Signed)
>>  ASSESSMENT AND PLAN FOR PAROXYSMAL ATRIAL FIBRILLATION WRITTEN ON 03/01/2023 12:37 PM BY Malva Limes, MD  >>ASSESSMENT AND PLAN FOR ATRIAL FIBRILLATION WRITTEN ON 03/01/2023 12:37 PM BY Malva Limes, MD  >>ASSESSMENT AND PLAN FOR ATRIAL FIBRILLATION WITH RVR WRITTEN ON 06/29/2022  2:26 PM BY Arnetha Courser, MD  S/p 2 unsuccessful attempt for cardioversion.  Remained in A-fib but rate controlled. Cardiology is recommending continuation of high-dose of amiodarone along with metoprolol and a close outpatient cardiology follow-up for further management. -Continue Eliquis.

## 2022-06-29 NOTE — Progress Notes (Signed)
PT Cancellation Note  Patient Details Name: CASPIAN DELEONARDIS MRN: [id number] DOB: 1956-03-02   Cancelled Treatment:    Reason Eval/Treat Not Completed: Other (comment).  PT consult received.  Chart reviewed.  Pt sleeping in bed upon PT arrival but woken with vc's.  Pt reports being too groggy (s/p cardioversion today) to attempt ambulation again at this time and requesting therapist return tomorrow (per chart pt became very dizzy and wobbly during ambulation when attempted earlier today).  Will re-attempt PT evaluation tomorrow.  Leitha Bleak, PT 06/29/22, 4:06 PM

## 2022-06-29 NOTE — Care Management Obs Status (Signed)
[place] NOTIFICATION   Patient Details  Name: ANAN DAPOLITO MRN: [id number] Date of Birth: 31-Oct-1956   Medicare Observation Status Notification Given:  Yes    Candie Chroman, LCSW 06/29/2022, 12:05 PM

## 2022-06-29 NOTE — Progress Notes (Signed)
Progress Note   Patient: Lawrence [name] [id number] DOB: 04-29-1956 DOA: 06/28/2022     1 DOS: the patient was seen and examined on 06/29/2022   Brief hospital course: Taken from H&P.  Lawrence [name] is a 66 y.o. Caucasian male with medical history significant for prior atrial fibrillation on Eliquis, asthma, coronary artery disease, celiac disease, COPD, DDD, and chronic diastolic CHF, who presented to the emergency room with acute onset of midsternal chest pain felt as pressure and graded 8-9/10 in severity with with associated radiation to left arm as well as nausea without vomiting or diaphoresis.  The patient has been having cough productive of greenish sputum as well as wheezing with mild dyspnea.  No fever or chills.  No headache or dizziness or blurred vision. ED Course: When he came to the ER BP was 144/112 with heart rate of 114 and pulse symmetry 94% on 3 L of O2 by nasal cannula.  Labs revealed blood glucose of 163 and potassium 3.4 with otherwise normal CMP.  High sensitive troponin I was 27/28 and CBC showed hemoconcentration.  COVID-19 PCR came back negative UA showed more than 500 glucose 100 protein   Imaging: Portable chest ray showed mild bibasal atelectatic changes.  EKG shows A-fib with RVR, patient was given Cardizem bolus followed by infusion.  Cardiology was consulted.  8/13: Heart rate improved with Cardizem infusion.  Cardiology is planning cardioversion tomorrow.  Increasing the dose of amiodarone.  Currently on baseline oxygen requirement of 2 L.  8/14: Patient had 2 unsuccessful attempts today for cardioversion.  Remained in A-fib.  Rate seems controlled. Cardiology is recommending continuation of amiodarone at high doses along with metoprolol.  He will also continue anticoagulation and need to have a close follow-up appointment with cardiology for further recommendations. Will get benefit from EP evaluation for ablation if needed-cardiology will arrange. Per  cardiology recommendations he was ready for discharge but become very dizzy and wobbly during ambulation. We will keep him for another night and will obtain PT evaluation.  Patient will continue on current medication and will follow-up with his providers for further management.     Assessment and Plan: * Atrial fibrillation with RVR ([place]) S/p 2 unsuccessful attempt for cardioversion.  Remained in A-fib but rate controlled. Cardiology is recommending continuation of high-dose of amiodarone along with metoprolol and a close outpatient cardiology follow-up for further management. -Continue Eliquis.  Hypokalemia Resolved.  Potassium at 4.2  Chest pain Troponin barely positive with a flat curve, most likely secondary to A-fib with RVR.  Pain improved continue to have some pressure. -Continue to monitor   Acute bronchitis - Patient will be given mucolytics and bronchodilator therapy as needed.  This is likely viral. - She has no evidence for pneumonia on chest x-ray.  Chronic diastolic HF (heart failure) (HCC) - We will continue torsemide, lisinopril and Farxiga  Dyslipidemia -We will continue statin therapy.  Essential hypertension - We will continue his antihypertensives.  Chronic respiratory failure with hypoxia ([facility]) Currently stable on his baseline oxygen requirement of 2 L. Continue home bronchodilators   Subjective: Patient was seen after cardioversion.  He was feeling very lethargic with the effect of anesthesia.  Physical Exam: Vitals:   06/29/22 1000 06/29/22 1015 06/29/22 1100 06/29/22 1137  BP: 110/70 107/78 (!) 128/93 110/79  Pulse: (!) 103 67 71 79  Resp: 15 17 17 18   Temp:    (!) 97.3 F (36.3 C)  TempSrc:      SpO2:  92% 91%  91%  Weight:      Height:       General.  Well-developed gentleman, in no acute distress. Pulmonary.  Lungs clear bilaterally, normal respiratory effort. CV.  Irregularly irregular Abdomen.  Soft, nontender, nondistended, BS  positive. CNS.  Alert and oriented .  No focal neurologic deficit. Extremities.  No edema, no cyanosis, pulses intact and symmetrical. Psychiatry.  Judgment and insight appears normal.  Data Reviewed: Prior data reviewed.  Family Communication: Discussed with daughter-in-law on phone  Disposition: Status is: Observation The patient remains OBS appropriate and will d/c before 2 midnights.  Planned Discharge Destination: Home  DVT prophylaxis.  Eliquis Time spent: 40 minutes  This record has been created using Systems analyst. Errors have been sought and corrected,but may not always be located. Such creation errors do not reflect on the standard of care.  Author: Lorella Nimrod, MD 06/29/2022 2:37 PM  For on call review [email or web address].

## 2022-06-29 NOTE — Assessment & Plan Note (Signed)
Resolved.  Potassium at 4.2

## 2022-06-29 NOTE — Care Management CC44 (Signed)
Condition Code 44 Documentation Completed  Patient Details  Name: Lawrence [name] MRN: [id number] Date of Birth: 1956/06/03   Condition Code 44 given:  Yes Patient signature on Condition Code 44 notice:  Yes Documentation of 2 MD's agreement:  Yes Code 44 added to claim:  Yes    Candie Chroman, LCSW 06/29/2022, 12:05 PM

## 2022-06-29 NOTE — Progress Notes (Signed)
[facility] Encounter Note  Patient: Lawrence [name] / Admit Date: 06/28/2022 / Date of Encounter: 06/29/2022, 9:34 AM   Subjective: Patient feels well overnight.  No evidence of chest discomfort or other significant symptoms or congestive heart failure.  Patient has had good heart rate control with current medical regimen including diltiazem drip.  He has had amiodarone load and will benefit from electrical cardioversion to normal sinus rhythm without complication.  The patient will require continued medication management as listed below.  Review of Systems: Positive for: This of breath Negative for: Vision change, hearing change, syncope, dizziness, nausea, vomiting,diarrhea, bloody stool, stomach pain, cough, congestion, diaphoresis, urinary frequency, urinary pain,skin lesions, skin rashes Others previously listed  Objective: Telemetry: Normal sinus rhythm Physical Exam: Blood pressure 119/74, pulse (!) 42, temperature 97.6 F (36.4 C), resp. rate 15, height 5' 7"  (1.702 m), weight 85.4 kg, SpO2 90 %. Body mass index is 29.48 kg/m. General: Well developed, well nourished, in no acute distress. Head: Normocephalic, atraumatic, sclera non-icteric, no xanthomas, nares are without discharge. Neck: No apparent masses Lungs: Normal respirations with diffuse wheezes, no rhonchi, no rales , no crackles   Heart: Regular rate and rhythm, normal S1 S2, no murmur, no rub, no gallop, PMI is normal size and placement, carotid upstroke normal without bruit, jugular venous pressure normal Abdomen: Soft, non-tender, non-distended with normoactive bowel sounds. No hepatosplenomegaly. Abdominal aorta is normal size without bruit Extremities: No edema, no clubbing, no cyanosis, no ulcers,  Peripheral: 2+ radial, 2+ femoral, 2+ dorsal pedal pulses Neuro: Alert and oriented. Moves all extremities spontaneously. Psych:  Responds to questions appropriately with a normal  affect.   Intake/Output Summary (Last 24 hours) at 06/29/2022 0934 Last data filed at 06/29/2022 0930 Gross per 24 hour  Intake 2955.96 ml  Output 400 ml  Net 2555.96 ml    Inpatient Medications:   [MAR Hold] allopurinol  300 mg Oral BID   [MAR Hold] ALPRAZolam  1 mg Oral TID   [MAR Hold] amiodarone  400 mg Oral BID   [MAR Hold] amLODipine  2.5 mg Oral Daily   [MAR Hold] apixaban  5 mg Oral BID   [MAR Hold] aspirin EC  81 mg Oral Daily   [MAR Hold] atorvastatin  80 mg Oral QHS   [MAR Hold] dapagliflozin propanediol  10 mg Oral Daily   [MAR Hold] guaiFENesin  600 mg Oral BID   [MAR Hold] isosorbide mononitrate  120 mg Oral Daily   [MAR Hold] linaclotide  72 mcg Oral QAC breakfast   [MAR Hold] lisinopril  10 mg Oral Daily   [MAR Hold] loratadine  10 mg Oral Daily   [MAR Hold] magnesium oxide  400 mg Oral Daily   [MAR Hold] metFORMIN  500 mg Oral BID WC   [MAR Hold] metoprolol tartrate  25 mg Oral BID   [MAR Hold] omega-3 acid ethyl esters  4 capsule Oral Daily   [MAR Hold] pantoprazole  40 mg Oral Daily   [MAR Hold] potassium chloride  40 mEq Oral Once   [MAR Hold] ranolazine  1,000 mg Oral BID   [MAR Hold] tiotropium  18 mcg Inhalation Daily   [MAR Hold] venlafaxine XR  75 mg Oral Q breakfast   Infusions:   sodium chloride 100 mL/hr at 06/29/22 0800   sodium chloride     diltiazem (CARDIZEM) infusion Stopped (06/29/22 0929)    Labs: Recent Labs    06/28/22 0118 06/28/22 0355 06/29/22 0527  NA 140 139  137  K 3.4* 3.8 4.2  CL 103 102 106  CO2 30 30 26   GLUCOSE 163* 167* 146*  BUN 18 18 21   CREATININE 1.03 1.02 0.74  CALCIUM 10.1 9.8 9.1  MG 1.9  --   --    Recent Labs    06/28/22 0118  AST 19  ALT 10  ALKPHOS 77  BILITOT 0.8  PROT 7.0  ALBUMIN 3.6   Recent Labs    06/28/22 0118 06/28/22 0355 06/29/22 0527  WBC 10.2 10.0 10.7*  NEUTROABS 7.4  --   --   HGB 17.7* 17.4* 14.9  HCT 54.2* 52.7* 45.4  MCV 91.1 91.2 91.2  PLT 210 188 176   No results  for input(s): "CKTOTAL", "CKMB", "TROPONINI" in the last 72 hours. Invalid input(s): "POCBNP" No results for input(s): "HGBA1C" in the last 72 hours.   Weights: Filed Weights   06/28/22 0111 06/29/22 0910  Weight: 85.4 kg 85.4 kg     Radiology/Studies:  DG Chest Portable 1 View  Result Date: 06/28/2022 CLINICAL DATA:  Chest pain and shortness of breath EXAM: PORTABLE CHEST 1 VIEW COMPARISON:  06/05/2022 FINDINGS: Cardiac shadow is stable. Postsurgical changes are again noted. Mild atelectatic changes are noted in the bases. No sizable effusion or confluent infiltrate is noted no bony abnormality is noted. IMPRESSION: Mild bibasilar atelectatic changes. Electronically Signed   By: Inez Catalina M.D.   On: 06/28/2022 02:20   DG Chest 2 View  Result Date: 06/05/2022 CLINICAL DATA:  Midsternal chest pain EXAM: CHEST - 2 VIEW COMPARISON:  04/26/2021 FINDINGS: Prior median sternotomy. Cardiac and mediastinal contours are within normal limits. Aortic atherosclerosis. No new focal pulmonary opacity. No pleural effusion or pneumothorax. No acute osseous abnormality. IMPRESSION: No acute cardiopulmonary process. Electronically Signed   By: Merilyn Baba M.D.   On: 06/05/2022 19:08   DG Shoulder Right  Result Date: 06/05/2022 CLINICAL DATA:  Fall. Unable to bear weight. Right shoulder pain for 2 weeks. EXAM: RIGHT SHOULDER - 2+ VIEW COMPARISON:  Right shoulder radiographs 01/28/2009 FINDINGS: Mild acromioclavicular joint space narrowing and peripheral osteophytosis. The humeral head is high-riding and nearly contacts the undersurface of the acromion. Mild inferior humeral head-neck junction degenerative osteophytosis. Mild peripheral glenoid degenerative osteophytosis. Partial visualization of median sternotomy wires. No acute fracture or dislocation. IMPRESSION: Mild acromioclavicular and glenohumeral osteoarthritis. Electronically Signed   By: Yvonne Kendall M.D.   On: 06/05/2022 12:03     Assessment  and Recommendation  66 y.o. male with known coronary disease status post coronary bypass graft chronic anginal symptoms with hypertension hyperlipidemia and paroxysmal nonvalvular atrial fibrillation with acute onset of rapid rate of atrial fibrillation requiring medication management for control without current evidence of acute myocardial infarction. 1.  Continue amiodarone at high dose to maintain normal sinus rhythm 2.  Continuation of metoprolol for heart rate control 3.  Anticoagulation for further risk reduction of stroke with atrial fibrillation 4.  Continue guideline medical therapy for chronic stable angina as before without change 5.  No further cardiac diagnostics necessary at this time 6.  If ambulating well with no further significant symptoms or concerns the patient may be discharged home with close follow-up thereafter    Signed, [name] M.D. FACC

## 2022-06-29 NOTE — Anesthesia Postprocedure Evaluation (Signed)
Anesthesia Post Note  Patient: ALEXIUS HANGARTNER  Procedure(s) Performed: CARDIOVERSION  Patient location during evaluation: Specials Recovery Anesthesia Type: General Level of consciousness: awake and alert Pain management: pain level controlled Vital Signs Assessment: post-procedure vital signs reviewed and stable Respiratory status: spontaneous breathing, nonlabored ventilation, respiratory function stable and patient connected to nasal cannula oxygen Cardiovascular status: blood pressure returned to baseline and stable Postop Assessment: no apparent nausea or vomiting Anesthetic complications: no   No notable events documented.   Last Vitals:  Vitals:   06/29/22 1100 06/29/22 1137  BP: (!) 128/93 110/79  Pulse: 71 79  Resp: 17 18  Temp:  (!) 36.3 C  SpO2:  91%    Last Pain:  Vitals:   06/29/22 1015  TempSrc:   PainSc: 0-No pain                 Precious Haws Ross Hefferan

## 2022-06-29 NOTE — Assessment & Plan Note (Signed)
S/p 2 unsuccessful attempt for cardioversion.  Remained in A-fib but rate controlled. Cardiology is recommending continuation of high-dose of amiodarone along with metoprolol and a close outpatient cardiology follow-up for further management. -Continue Eliquis.

## 2022-06-29 NOTE — Assessment & Plan Note (Signed)
>>  ASSESSMENT AND PLAN FOR ATRIAL FIBRILLATION WRITTEN ON 03/01/2023 12:37 PM BY Malva Limes, MD  >>ASSESSMENT AND PLAN FOR ATRIAL FIBRILLATION WITH RVR WRITTEN ON 06/29/2022  2:26 PM BY Arnetha Courser, MD  S/p 2 unsuccessful attempt for cardioversion.  Remained in A-fib but rate controlled. Cardiology is recommending continuation of high-dose of amiodarone along with metoprolol and a close outpatient cardiology follow-up for further management. -Continue Eliquis.

## 2022-06-29 NOTE — Assessment & Plan Note (Signed)
-   Patient will be given mucolytics and bronchodilator therapy as needed.  This is likely viral. - She has no evidence for pneumonia on chest x-ray.

## 2022-06-29 NOTE — Assessment & Plan Note (Signed)
>>  ASSESSMENT AND PLAN FOR ATRIAL FIBRILLATION WITH RVR WRITTEN ON 06/29/2022  2:26 PM BY Arnetha Courser, MD  S/p 2 unsuccessful attempt for cardioversion.  Remained in A-fib but rate controlled. Cardiology is recommending continuation of high-dose of amiodarone along with metoprolol and a close outpatient cardiology follow-up for further management. -Continue Eliquis.

## 2022-06-29 NOTE — Anesthesia Preprocedure Evaluation (Signed)
Anesthesia Evaluation  Patient identified by MRN, date of birth, ID band Patient awake    Reviewed: Allergy & Precautions, NPO status , Patient's Chart, lab work & pertinent test results  History of Anesthesia Complications Negative for: history of anesthetic complications  Airway Mallampati: III  TM Distance: <3 FB Neck ROM: full    Dental  (+) Chipped   Pulmonary asthma , sleep apnea , COPD,  COPD inhaler and oxygen dependent, former smoker,    Pulmonary exam normal        Cardiovascular hypertension, + CAD, + Past MI, + Cardiac Stents, + CABG and +CHF  + dysrhythmias Atrial Fibrillation  Rhythm:irregular Rate:Tachycardia     Neuro/Psych PSYCHIATRIC DISORDERS  Neuromuscular disease    GI/Hepatic Neg liver ROS, hiatal hernia, GERD  Controlled,  Endo/Other  negative endocrine ROSdiabetes  Renal/GU Renal disease  negative genitourinary   Musculoskeletal   Abdominal   Peds  Hematology negative hematology ROS (+)   Anesthesia Other Findings Past Medical History: No date: A-fib ([place]) No date: Anemia No date: Anginal pain ([place]) No date: Anxiety No date: Arthritis No date: Asthma No date: CAD (coronary artery disease)     Comment:  a. 2002 CABGx2 (LIMA->LAD, VG->VG->OM1);  b. 09/2012               DES->OM;  c. 03/2015 PTCA of LAD [name]) in setting of               atretic LIMA; d. 05/2015 Cath [facility]): nonobs dzs; e. 06/2015              Cath ([facility]): LM nl, LAD 45p/d ISR, 50d, D1/2 small, LCX               50p/d ISR, OM1 70ost, 30 ISR, VG->OM1 50ost, 83m               LIMA->LAD 99p/d - atretic, RCA dom, nl; f.cath 10/16:               40-50%(FFR 0.90) pLAD, 75% (FFR 0.77) mLAD s/p PCI/DES,               oRCA 40% (FFR0.95) No date: Cancer ([place]     Comment:  SKIN CANCER ON BACK No date: Celiac disease No date: Chronic diastolic CHF (congestive heart failure) ([place]     Comment:  a. 06/2009 Echo: EF 60-65%, Gr 1 DD,  triv AI, mildly dil               LA, nl RV. No date: COPD (chronic obstructive pulmonary disease) (HCC)     Comment:  a. Chronic bronchitis and emphysema. No date: DDD (degenerative disc disease), lumbar No date: Diverticulosis No date: Dysrhythmia No date: Essential hypertension No date: GERD (gastroesophageal reflux disease) No date: History of hiatal hernia No date: History of kidney stones     Comment:  H/O No date: History of tobacco abuse     Comment:  a. Quit 2014. 2002: Myocardial infarction ([place]     Comment:  4 STENTS No date: Pancreatitis No date: PSVT (paroxysmal supraventricular tachycardia) ([place]     Comment:  a. 10/2012 Noted on Zio Patch. No date: Sleep apnea     Comment:  LOST CORD TO CPAP -ONLY 02 @ BEDTIME No date: Tubular adenoma of colon No date: Type II diabetes mellitus ([facility]  Past Surgical History: No date: BYPASS GRAFT 07/12/2015: CARDIAC CATHETERIZATION; N/A     Comment:  rocedure: Left Heart Cath and Cors/Grafts  Angiography;                Surgeon: Belva Crome, MD;  Location: [place] CV               LAB;  Service: Cardiovascular;  Laterality: N/A; 10/07/2015: CARDIAC CATHETERIZATION; Right     Comment:  Procedure: Left Heart Cath and Cors/Grafts Angiography;               Surgeon: Dionisio David, MD;  Location: [place] CV               LAB;  Service: Cardiovascular;  Laterality: Right; 04/06/2016: CARDIAC CATHETERIZATION; N/A     Comment:  Procedure: Left Heart Cath and Coronary Angiography;                Surgeon: Yolonda Kida, MD;  Location: [place]               CV LAB;  Service: Cardiovascular;  Laterality: N/A; 04/06/2016: CARDIAC CATHETERIZATION     Comment:  Procedure: Bypass Graft Angiography;  Surgeon: Yolonda Kida, MD;  Location: [place] CV LAB;  Service:               Cardiovascular;; 11/02/2016: CARDIAC CATHETERIZATION; N/A     Comment:  Procedure: Left Heart Cath and Cors/Grafts  Angiography               and possible PCI;  Surgeon: Yolonda Kida, MD;                Location: [place] CV LAB;  Service: Cardiovascular;              Laterality: N/A; 11/02/2016: CARDIAC CATHETERIZATION; N/A     Comment:  Procedure: Coronary Stent Intervention;  Surgeon: Yolonda Kida, MD;  Location: [place] CV LAB;                Service: Cardiovascular;  Laterality: N/A; No date: CHOLECYSTECTOMY 06/09/2019: CIRCUMCISION; N/A     Comment:  Procedure: CIRCUMCISION ADULT;  Surgeon: Billey Co, MD;  Location: [facility] ORS;  Service: Urology;                Laterality: N/A; 04/01/2018: COLONOSCOPY WITH PROPOFOL; N/A     Comment:  Procedure: COLONOSCOPY WITH PROPOFOL;  Surgeon: Manya Silvas, MD;  Location: [facility] ENDOSCOPY;  Service:               Endoscopy;  Laterality: N/A; No date: ESOPHAGEAL DILATION 04/01/2018: ESOPHAGOGASTRODUODENOSCOPY (EGD) WITH PROPOFOL; N/A     Comment:  Procedure: ESOPHAGOGASTRODUODENOSCOPY (EGD) WITH               PROPOFOL;  Surgeon: Manya Silvas, MD;  Location:               [facility] ENDOSCOPY;  Service: Endoscopy;  Laterality: N/A; 06/12/2019: LEFT HEART CATH AND CORS/GRAFTS ANGIOGRAPHY; N/A     Comment:  Procedure: LEFT HEART CATH AND CORS/GRAFTS ANGIOGRAPHY;               Surgeon: Teodoro Spray, MD;  Location: [place] INVASIVE CV  LAB;  Service: Cardiovascular;  Laterality: N/A; 03/11/2020: LEFT HEART CATH AND CORS/GRAFTS ANGIOGRAPHY; N/A     Comment:  Procedure: LEFT HEART CATH AND CORS/GRAFTS ANGIOGRAPHY;               Surgeon: Isaias Cowman, MD;  Location: [place] CV LAB;  Service: Cardiovascular;  Laterality:               N/A; 05/01/2021: LEFT HEART CATH AND CORS/GRAFTS ANGIOGRAPHY; N/A     Comment:  Procedure: LEFT HEART CATH AND CORS/GRAFTS ANGIOGRAPHY;               Surgeon: Corey Skains, MD;  Location: [place]               CV LAB;   Service: Cardiovascular;  Laterality: N/A; No date: TONSILLECTOMY No date: VASCULAR SURGERY  BMI    Body Mass Index: 29.48 kg/m      Reproductive/Obstetrics negative OB ROS                             Anesthesia Physical Anesthesia Plan  ASA: 4  Anesthesia Plan: General   Post-op Pain Management:    Induction: Intravenous  PONV Risk Score and Plan: Propofol infusion and TIVA  Airway Management Planned: Natural Airway and Nasal Cannula  Additional Equipment:   Intra-op Plan:   Post-operative Plan:   Informed Consent: I have reviewed the patients History and Physical, chart, labs and discussed the procedure including the risks, benefits and alternatives for the proposed anesthesia with the patient or authorized representative who has indicated his/her understanding and acceptance.     Dental Advisory Given  Plan Discussed with: Anesthesiologist, CRNA and Surgeon  Anesthesia Plan Comments: (Patient consented for risks of anesthesia including but not limited to:  - adverse reactions to medications - risk of airway placement if required - damage to eyes, teeth, lips or other oral mucosa - nerve damage due to positioning  - sore throat or hoarseness - Damage to heart, brain, nerves, lungs, other parts of body or loss of life  Patient voiced understanding.)        Anesthesia Quick Evaluation

## 2022-06-29 NOTE — CV Procedure (Signed)
Electrical Cardioversion Procedure Note Lawrence [name] [id number] 09/18/1956  Procedure: Electrical Cardioversion Indications:  Paroxysmal non valvular atrial fibrillation  Procedure Details Consent: Risks of procedure as well as the alternatives and risks of each were explained to the (patient/caregiver).  Consent for procedure obtained. Time Out: Verified patient identification, verified procedure, site/side was marked, verified correct patient position, special equipment/implants available, medications/allergies/relevent history reviewed, required imaging and test results available.  Performed  Patient placed on cardiac monitor, pulse oximetry, supplemental oxygen as necessary.  Sedation given: Propofol and versed as per anesthesia  Pacer pads placed anterior and posterior chest.  Cardioverted 1 time(s).  Cardioverted at 120J.  Evaluation Findings: Post procedure EKG shows: NSR Complications: None Patient did tolerate procedure well.   Lawrence [name] M.D. [facility] 06/29/2022, 9:34 AM

## 2022-06-29 NOTE — Anesthesia Procedure Notes (Signed)
Date/Time: 06/29/2022 9:19 AM  Performed by: Johnna Acosta, CRNAPre-anesthesia Checklist: Patient identified, Emergency Drugs available, Suction available, Patient being monitored and Timeout performed Patient Re-evaluated:Patient Re-evaluated prior to induction Oxygen Delivery Method: Nasal cannula Preoxygenation: Pre-oxygenation with 100% oxygen Induction Type: IV induction

## 2022-06-30 DIAGNOSIS — I4891 Unspecified atrial fibrillation: Secondary | ICD-10-CM | POA: Diagnosis not present

## 2022-06-30 DIAGNOSIS — I480 Paroxysmal atrial fibrillation: Secondary | ICD-10-CM | POA: Diagnosis not present

## 2022-06-30 MED ORDER — AMIODARONE HCL 400 MG PO TABS: 400.0000 mg | ORAL_TABLET | Freq: Two times a day (BID) | ORAL | 0 refills | Status: DC

## 2022-06-30 NOTE — Progress Notes (Addendum)
Attempted to call son Cevin Rubinstein) 6 times regarding pt discharge with no response. Message left to call back. Case Worker, and Reesa Chew, MD aware.

## 2022-06-30 NOTE — TOC Initial Note (Addendum)
Transition of Care [facility]) - Initial/Assessment Note    Patient Details  Name: Lawrence [name] MRN: [id number] Date of Birth: November 21, 1955  Transition of Care [facility]) CM/SW Contact:    Candie Chroman, LCSW Phone Number: 06/30/2022, 12:55 PM  Clinical Narrative:  CSW met with patient. No supports at bedside. CSW explained that PT recommendations would be discussed. Patient is agreeable to home health. Reviewed CMS scores. Patient prefers [facility] but they do not have a nurse available for about two weeks. RN would be beneficial due to heart failure. Left message for [facility] representative to see if they can accept. Patient agreeable to RW. Ordered through [employer]. He is on home oxygen through [employer].              1:08 pm: Alvis Lemmings can accept referral for PT, OT, RN.  Expected Discharge Plan: [place] Barriers to Discharge: Continued Medical Work up   Patient Goals and CMS Choice   CMS Medicare.gov Compare Post Acute Care list provided to:: Patient    Expected Discharge Plan and Services Expected Discharge Plan: [place] Choice: Home Health, Durable Medical Equipment Living arrangements for the past 2 months: Single Family Home                 DME Arranged: Walker rolling DME Agency: [employer] Date DME Agency Contacted: 06/30/22   Representative spoke with at DME Agency: Suanne Marker            Prior Living Arrangements/Services Living arrangements for the past 2 months: [place] with:: Self Patient language and need for interpreter reviewed:: Yes Do you feel safe going back to the place where you live?: Yes      Need for Family Participation in Patient Care: Yes (Comment)   Current home services: DME Criminal Activity/Legal Involvement Pertinent to Current Situation/Hospitalization: No - Comment as needed  Activities of Daily Living      Permission Sought/Granted Permission sought to share information  with : Facility Art therapist granted to share information with : Yes, Verbal Permission Granted     Permission granted to share info w AGENCY: Home Health Agencies        Emotional Assessment Appearance:: Appears stated age Attitude/Demeanor/Rapport: Engaged, Gracious Affect (typically observed): Accepting, Appropriate, Calm, Pleasant Orientation: : Oriented to Self, Oriented to Place, Oriented to  Time, Oriented to Situation Alcohol / Substance Use: Not Applicable Psych Involvement: No (comment)  Admission diagnosis:  Other chest pain [R07.89] Productive cough [R05.8] COPD exacerbation (HCC) [J44.1] Atrial fibrillation with RVR (HCC) [I48.91] Patient Active Problem List   Diagnosis Date Noted   Atrial fibrillation with RVR ([place]) 06/28/2022   Essential hypertension 06/28/2022   Dyslipidemia 06/28/2022   Hypokalemia 06/28/2022   Acute bronchitis 06/28/2022   Chronic diastolic HF (heart failure) (HCC)    Atherosclerosis of aorta ([place]) 12/16/2020   Syncope 12/11/2020   Polycythemia 10/05/2020   Primary insomnia 05/13/2020   Recurrent major depressive disorder, remission status unspecified ([place]) 03/29/2020   Chronic respiratory failure with hypoxia ([place]) 05/29/2019   Trigger thumb of right hand 11/28/2018   History of adenomatous polyp of colon 04/05/2018   CKD (chronic kidney disease) stage 2, GFR 60-89 ml/min 04/03/2016   Anemia 04/03/2016   Atrial fibrillation ([place]) 12/23/2015   OSA (obstructive sleep apnea) 12/10/2015   Left inguinal hernia 11/07/2015   Anxiety 11/07/2015   Back pain with left-sided radiculopathy 09/30/2015  Nocturnal hypoxia 09/06/2015   BPH (benign prostatic hyperplasia) 27/55/6239   Chronic systolic CHF (congestive heart failure) (HCC)    Angina pectoris ([place])    Chest pain 07/11/2015   COPD (chronic obstructive pulmonary disease) ([place]) 07/03/2015   CAD (coronary artery disease) 06/26/2015   Primary hypertension 06/26/2015    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[place]) 06/26/2015   Achalasia 07/24/2014   GERD (gastroesophageal reflux disease) 06/07/2014   Former tobacco use 04/11/2013   HLD (hyperlipidemia) 04/09/2013   PCP:  Birdie Sons, MD Pharmacy:   [place], [place] [address] [place] [address] Phone: [phone] Fax: [phone]  [facility] [place], [place] [address] Phone: [phone] Fax: [phone]  [id number] - [address], [address] [place] [address] Phone: [phone] Fax: [phone]     Social Determinants of Health (SDOH) Interventions    Readmission Risk Interventions     No data to display

## 2022-06-30 NOTE — TOC Transition Note (Signed)
Transition of Care [facility]) - CM/SW Discharge Note   Patient Details  Name: IBAN UTZ MRN: [id number] Date of Birth: 08-Apr-1956  [facility]) CM/SW Contact:  Candie Chroman, LCSW Phone Number: 06/30/2022, 1:31 PM   Clinical Narrative:   Patient has orders to discharge home today. [facility] representative is aware. No further concerns. CSW signing off.  Final next level of care: Home w Home Health Services Barriers to Discharge: No Barriers Identified   Patient Goals and CMS Choice   CMS Medicare.gov Compare Post Acute Care list provided to:: Patient    Discharge Placement                Patient to be transferred to facility by: Family   Patient and family notified of of transfer: 06/30/22  Discharge Plan and Services     Post Acute Care Choice: Home Health, Durable Medical Equipment          DME Arranged: Walker rolling DME Agency: [employer] Date DME Agency Contacted: 06/30/22   Representative spoke with at DME Agency: Suanne Marker HH Arranged: RN, PT, OT, Nurse's Aide [place] Agency: [place] Date [facility] Agency Contacted: 06/30/22   Representative spoke with at [place]: Adela Lank  Social Determinants of Health (SDOH) Interventions     Readmission Risk Interventions     No data to display

## 2022-06-30 NOTE — Progress Notes (Signed)
2100-EMS arrived to transport patient to home. Patient has oxygen tank and walker that is to go with patient to home. EMS staff states that they can not take these items with the patient because they can not be secured in the back of the truck. They stated they even talked to their supervisor and are declining to take patient. Patient also has concerns that he has had increase weakness since this afternoon and is worried about going home. Provider K. Foust, NP made aware of patient's concerns.

## 2022-06-30 NOTE — Progress Notes (Signed)
       CROSS COVER NOTE  NAME: Lawrence [name] MRN: [id number] DOB : Dec 27, 1955   Notified by nursing staff that EMS has declined to tranport Lawrence Lawrence [name] because he has an oxygen tank and walker. Lawrence [name] also reports feeling too weak to discharge home. Per staff plan was to discharge patient home where his son and another family member would meet him and assist him as needed. Due to EMS refusal to transport patient will remain inpatient tonight with anticipated discharge in AM. [facility] contacted ***  This document was prepared using Dragon voice recognition software and may include unintentional dictation errors.  Neomia Glass DNP, MHA, FNP-BC Nurse Practitioner [facility] Pager [phone]

## 2022-06-30 NOTE — Discharge Summary (Signed)
Physician Discharge Summary   Patient: Lawrence [name] MRN: [id number] DOB: [birth year]  Admit date:     06/28/2022  Discharge date: 06/30/22  Discharge Physician: Lorella Nimrod   PCP: Birdie Sons, MD   Recommendations at discharge:  Follow-up with cardiology within a week Follow-up with primary care provider  Discharge Diagnoses: Principal Problem:   Atrial fibrillation with RVR ([place]) Active Problems:   Chest pain   Hypokalemia   Acute bronchitis   Chronic diastolic HF (heart failure) (HCC)   Chronic respiratory failure with hypoxia [facility])   Essential hypertension   Dyslipidemia   Hospital Course: Taken from H&P.  Lawrence [name] is a 66 y.o. Caucasian male with medical history significant for prior atrial fibrillation on Eliquis, asthma, coronary artery disease, celiac disease, COPD, DDD, and chronic diastolic CHF, who presented to the emergency room with acute onset of midsternal chest pain felt as pressure and graded 8-9/10 in severity with with associated radiation to left arm as well as nausea without vomiting or diaphoresis.  The patient has been having cough productive of greenish sputum as well as wheezing with mild dyspnea.  No fever or chills.  No headache or dizziness or blurred vision. ED Course: When he came to the ER BP was 144/112 with heart rate of 114 and pulse symmetry 94% on 3 L of O2 by nasal cannula.  Labs revealed blood glucose of 163 and potassium 3.4 with otherwise normal CMP.  High sensitive troponin I was 27/28 and CBC showed hemoconcentration.  COVID-19 PCR came back negative UA showed more than 500 glucose 100 protein   Imaging: Portable chest ray showed mild bibasal atelectatic changes.  EKG shows A-fib with RVR, patient was given Cardizem bolus followed by infusion.  Cardiology was consulted.  8/13: Heart rate improved with Cardizem infusion.  Cardiology is planning cardioversion tomorrow.  Increasing the dose of amiodarone.  Currently on baseline  oxygen requirement of 2 L.  8/14: Patient had 2 unsuccessful attempts today for cardioversion.  Remained in A-fib.  Rate seems controlled. Cardiology is recommending continuation of amiodarone at high doses along with metoprolol.  He will also continue anticoagulation and need to have a close follow-up appointment with cardiology for further recommendations. Will get benefit from EP evaluation for ablation if needed-cardiology will arrange. Per cardiology recommendations he was ready for discharge but become very dizzy and wobbly during ambulation. We will keep him for another night and will obtain PT evaluation.  8/15: Patient continued to complain some chest pain which seems atypical and dizziness with ambulation.  Orthostatic vitals negative.  We obtained PT evaluation and they recommended home health services which were ordered. Discussed with cardiology and they are recommending discharge with current medications and there is no further cardiac work-up needed.  Remained in A-fib with well-controlled heart rate. He need to have a close follow-up with his cardiologist for further recommendations.  Patient will continue current medication and follow-up with his providers.  Patient will continue on current medication and will follow-up with his providers for further management.    Assessment and Plan: * Atrial fibrillation with RVR ([place]) S/p 2 unsuccessful attempt for cardioversion.  Remained in A-fib but rate controlled. Cardiology is recommending continuation of high-dose of amiodarone along with metoprolol and a close outpatient cardiology follow-up for further management. -Continue Eliquis.  Hypokalemia Resolved.  Potassium at 4.2  Chest pain Troponin barely positive with a flat curve, most likely secondary to A-fib with RVR.  Pain improved continue to  have some pressure. -Continue to monitor   Acute bronchitis - Patient will be given mucolytics and bronchodilator therapy as  needed.  This is likely viral. - She has no evidence for pneumonia on chest x-ray.  Chronic diastolic HF (heart failure) (HCC) - We will continue torsemide, lisinopril and Farxiga  Dyslipidemia -We will continue statin therapy.  Essential hypertension - We will continue his antihypertensives.  Chronic respiratory failure with hypoxia (HCC) Currently stable on his baseline oxygen requirement of 2 L. Continue home bronchodilators   Consultants: Cardiology Procedures performed: DCCV Disposition: Home health Diet recommendation:  Discharge Diet Orders (From admission, onward)     Start     Ordered   06/30/22 0000  Diet - low sodium heart healthy        06/30/22 1324           Cardiac diet DISCHARGE MEDICATION: Allergies as of 06/30/2022       Reactions   Demerol  [meperidine Hcl]    Demerol [meperidine] Hives   Jardiance [empagliflozin] Other (See Comments)   Perineal pain   Prednisone Other (See Comments), Hypertension   Pt states that this medication puts him in A-fib    Sulfa Antibiotics Hives   Albuterol Sulfate [albuterol] Palpitations, Other (See Comments)   Pt currently uses this medication.     Morphine Sulfate Nausea And Vomiting, Rash, Other (See Comments)   Pt states that he is only allergic to the tablet form of this medication.          Medication List     TAKE these medications    Accu-Chek Guide Control Liqd Use with blood glucose monitor as directed   Accu-Chek Guide test strip Generic drug: glucose blood Use as instructed to check sugar daily for type 2 diabetes.   Accu-Chek Guide w/Device Kit Use to check blood sugars as directed   Accu-Chek Softclix Lancets lancets Use as instructed to check sugar daily for type 2 diabetes.   albuterol 108 (90 Base) MCG/ACT inhaler Commonly known as: VENTOLIN HFA INHALE 2 PUFFS BY MOUTH EVERY 6 HOURS AS NEEDED FOR SHORTNESS OF BREATH   allopurinol 300 MG tablet Commonly known as: ZYLOPRIM TAKE  1 TABLET BY MOUTH TWICE A DAY   ALPRAZolam 1 MG tablet Commonly known as: XANAX Take 1 tablet (1 mg total) by mouth 3 (three) times daily.   amiodarone 400 MG tablet Commonly known as: PACERONE Take 1 tablet (400 mg total) by mouth 2 (two) times daily. What changed:  medication strength how much to take when to take this   amLODipine 2.5 MG tablet Commonly known as: NORVASC TAKE 1 TABLET BY MOUTH DAILY   aspirin 81 MG chewable tablet Chew 81 mg by mouth daily. Swallows whole   atorvastatin 80 MG tablet Commonly known as: LIPITOR TAKE 1 TABLET BY MOUTH AT BEDTIME   Belsomra 5 MG Tabs Generic drug: Suvorexant TAKE 1 TABLET (5 MG TOTAL) BY MOUTH AT BEDTIME AS NEEDED   Breo Ellipta 100-25 MCG/ACT Aepb Generic drug: fluticasone furoate-vilanterol INHALE 1 PUFF BY MOUTH INTO THE LUNGS DAILY.   celecoxib 200 MG capsule Commonly known as: CELEBREX Take 1 capsule by mouth 2 (two) times daily.   cetirizine 10 MG tablet Commonly known as: ZYRTEC TAKE 1 TABLET BY MOUTH AT BEDTIME   docusate sodium 100 MG capsule Commonly known as: COLACE Take 100 mg by mouth daily as needed for mild constipation.   Eliquis 5 MG Tabs tablet Generic drug: apixaban TAKE 1 TABLET  BY MOUTH TWICE A DAY   Farxiga 10 MG Tabs tablet Generic drug: dapagliflozin propanediol TAKE 1 TABLET BY MOUTH DAILY   isosorbide mononitrate 120 MG 24 hr tablet Commonly known as: IMDUR TAKE 1 TABLET BY MOUTH DAILY What changed: Another medication with the same name was removed. Continue taking this medication, and follow the directions you see here.   linaclotide 72 MCG capsule Commonly known as: LINZESS Take 72 mcg by mouth daily before breakfast.   lisinopril 10 MG tablet Commonly known as: ZESTRIL TAKE 1 TABLET BY MOUTH DAILY   magnesium oxide 400 MG tablet Commonly known as: MAG-OX Take 1 tablet by mouth daily.   metFORMIN 500 MG 24 hr tablet Commonly known as: GLUCOPHAGE-XR TAKE 1 TABLET BY  MOUTH TWICE A DAY   methocarbamol 500 MG tablet Commonly known as: ROBAXIN TAKE 1 TABLET BY MOUTH EVERY 8 HOURS AS NEEDED FOR MUSCLE SPASMS   metoprolol tartrate 25 MG tablet Commonly known as: LOPRESSOR Take 25 mg by mouth 2 (two) times daily.   naloxone 4 MG/0.1ML Liqd nasal spray kit Commonly known as: NARCAN 1 spray into nostril x1 and may repeat every 2-3 minutes until patient is responsive or EMS arrives   nitroGLYCERIN 0.4 MG SL tablet Commonly known as: NITROSTAT PLACE ONE TABLET UNDER THE TONGUE EVERY 5 MINUTES FOR CHEST PAIN. IF NO RELIEF PAST 3RD TAB GO TO EMERGENCY ROOM   omega-3 acid ethyl esters 1 g capsule Commonly known as: LOVAZA TAKE 4 CAPSULES BY MOUTH DAILY   omeprazole 40 MG capsule Commonly known as: PRILOSEC TAKE 1 CAPSULE BY MOUTH 2 TIMES DAILY 30 MINUTES BEFORE BREAKFAST AND DINNER   ondansetron 4 MG disintegrating tablet Commonly known as: ZOFRAN-ODT Take 1 tablet (4 mg total) by mouth every 8 (eight) hours as needed for nausea or vomiting.   oxyCODONE-acetaminophen 10-325 MG tablet Commonly known as: PERCOCET TAKE 1 TABLET BY MOUTH EVERY 4 HOURS AS NEEDED FOR PAIN   ranolazine 1000 MG SR tablet Commonly known as: RANEXA Take 1 tablet (1,000 mg total) by mouth 2 (two) times daily.   Spiriva HandiHaler 18 MCG inhalation capsule Generic drug: tiotropium PLACE 1 CAPSULE INTO INHALER AND INHALE BY MOUTH ONCE A DAY.   torsemide 100 MG tablet Commonly known as: DEMADEX Take 0.5 tablets (50 mg total) by mouth daily.   traZODone 150 MG tablet Commonly known as: DESYREL TAKE 1 TABLET BY MOUTH AT BEDTIME   venlafaxine XR 75 MG 24 hr capsule Commonly known as: EFFEXOR-XR TAKE 1 CAPSULE BY MOUTH DAILY WITH BREAKFAST               Durable Medical Equipment  (From admission, onward)           Start     Ordered   06/30/22 1246  For home use only DME Walker rolling  Once       Question Answer Comment  Walker: With 5 Inch Wheels    Patient needs a walker to treat with the following condition Other abnormalities of gait and mobility      06/30/22 1246            Follow-up Information     [facility] Follow up.   Specialty: [place] Why: They will follow up with you for your home health needs Contact information: [address] [place] [address] [phone]         Birdie Sons, MD. Schedule an appointment as soon as  possible for a visit in 1 week(s).   Specialty: Family Medicine Contact information: [address] [place] [address] [phone]         Corey Skains, MD. Schedule an appointment as soon as possible for a visit in 1 week(s).   Specialty: Cardiology Contact information: [address] [place] [facility] [place] [address] [phone]                Discharge Exam: Danley Danker Weights   06/28/22 0111 06/29/22 0910  Weight: 85.4 kg 85.4 kg   General.     In no acute distress. Pulmonary.  Lungs clear bilaterally, normal respiratory effort. CV.  Irregularly irregular Abdomen.  Soft, nontender, nondistended, BS positive. CNS.  Alert and oriented .  No focal neurologic deficit. Extremities.  No edema, no cyanosis, pulses intact and symmetrical. Psychiatry.  Judgment and insight appears normal.   Condition at discharge: stable  The results of significant diagnostics from this hospitalization (including imaging, microbiology, ancillary and laboratory) are listed below for reference.   Imaging Studies: DG Chest Portable 1 View  Result Date: 06/28/2022 CLINICAL DATA:  Chest pain and shortness of breath EXAM: PORTABLE CHEST 1 VIEW COMPARISON:  06/05/2022 FINDINGS: Cardiac shadow is stable. Postsurgical changes are again noted. Mild atelectatic changes are noted in the bases. No sizable effusion or confluent infiltrate is noted no bony abnormality is noted. IMPRESSION: Mild bibasilar  atelectatic changes. Electronically Signed   By: Inez Catalina M.D.   On: 06/28/2022 02:20   DG Chest 2 View  Result Date: 06/05/2022 CLINICAL DATA:  Midsternal chest pain EXAM: CHEST - 2 VIEW COMPARISON:  04/26/2021 FINDINGS: Prior median sternotomy. Cardiac and mediastinal contours are within normal limits. Aortic atherosclerosis. No new focal pulmonary opacity. No pleural effusion or pneumothorax. No acute osseous abnormality. IMPRESSION: No acute cardiopulmonary process. Electronically Signed   By: Merilyn Baba M.D.   On: 06/05/2022 19:08   DG Shoulder Right  Result Date: 06/05/2022 CLINICAL DATA:  Fall. Unable to bear weight. Right shoulder pain for 2 weeks. EXAM: RIGHT SHOULDER - 2+ VIEW COMPARISON:  Right shoulder radiographs 01/28/2009 FINDINGS: Mild acromioclavicular joint space narrowing and peripheral osteophytosis. The humeral head is high-riding and nearly contacts the undersurface of the acromion. Mild inferior humeral head-neck junction degenerative osteophytosis. Mild peripheral glenoid degenerative osteophytosis. Partial visualization of median sternotomy wires. No acute fracture or dislocation. IMPRESSION: Mild acromioclavicular and glenohumeral osteoarthritis. Electronically Signed   By: Yvonne Kendall M.D.   On: 06/05/2022 12:03    Microbiology: Results for orders placed or performed during the hospital encounter of 06/28/22  SARS Coronavirus 2 by RT PCR (hospital order, performed in [facility] hospital lab) *cepheid single result test* Anterior Nasal Swab     Status: None   Collection Time: 06/28/22  1:18 AM   Specimen: Anterior Nasal Swab  Result Value Ref Range Status   SARS Coronavirus 2 by RT PCR NEGATIVE NEGATIVE Final    Comment: (NOTE) SARS-CoV-2 target nucleic acids are NOT DETECTED.  The SARS-CoV-2 RNA is generally detectable in upper and lower respiratory specimens during the acute phase of infection. The lowest concentration of SARS-CoV-2 viral copies this assay  can detect is 250 copies / mL. A negative result does not preclude SARS-CoV-2 infection and should not be used as the sole basis for treatment or other patient management decisions.  A negative result may occur with improper specimen collection / handling, submission of specimen other than nasopharyngeal swab, presence of  viral mutation(s) within the areas targeted by this assay, and inadequate number of viral copies (<250 copies / mL). A negative result must be combined with clinical observations, patient history, and epidemiological information.  Fact Sheet for Patients:   https://www.patel.info/  Fact Sheet for Healthcare Providers: https://hall.com/  This test is not yet approved or  cleared by the [place] FDA and has been authorized for detection and/or diagnosis of SARS-CoV-2 by FDA under an Emergency Use Authorization (EUA).  This EUA will remain in effect (meaning this test can be used) for the duration of the COVID-19 declaration under Section 564(b)(1) of the Act, 21 U.S.C. section 360bbb-3(b)(1), unless the authorization is terminated or revoked sooner.  Performed at [facility], [address] [place], [place] [address]     Labs: CBC: Recent Labs  Lab 06/28/22 0118 06/28/22 0355 06/29/22 0527  WBC 10.2 10.0 10.7*  NEUTROABS 7.4  --   --   HGB 17.7* 17.4* 14.9  HCT 54.2* 52.7* 45.4  MCV 91.1 91.2 91.2  PLT 210 188 254   Basic Metabolic Panel: Recent Labs  Lab 06/28/22 0118 06/28/22 0355 06/29/22 0527  NA 140 139 137  K 3.4* 3.8 4.2  CL 103 102 106  CO2 30 30 26   GLUCOSE 163* 167* 146*  BUN 18 18 21   CREATININE 1.03 1.02 0.74  CALCIUM 10.1 9.8 9.1  MG 1.9  --   --    Liver Function Tests: Recent Labs  Lab 06/28/22 0118  AST 19  ALT 10  ALKPHOS 77  BILITOT 0.8  PROT 7.0  ALBUMIN 3.6   CBG: Recent Labs  Lab 06/29/22 0949 06/29/22 1729  GLUCAP 160* 115*    Discharge time  spent: greater than 30 minutes.  This record has been created using Systems analyst. Errors have been sought and corrected,but may not always be located. Such creation errors do not reflect on the standard of care.   Signed: Lorella Nimrod, MD [facility] 06/30/2022

## 2022-06-30 NOTE — Progress Notes (Signed)
EMS called per Lawrence [name], [place]. Awaiting transport for discharge. AVS reviewed with pt. Pt has no questions at this time.

## 2022-06-30 NOTE — Evaluation (Signed)
Physical Therapy Evaluation Patient Details Name: Lawrence [name] MRN: [id number] DOB: Apr 08, 1956 Today's Date: 06/30/2022  History of Present Illness  Pt is a 66 y.o. male presenting to hospital for evaluation of chest pain (palpitations, dyspnea, and productive cough).  Pt admitted with a-fib with RVR, hypokalemia, chest pain, and acute bronchitis.  S/p cardioversion 06/29/22 (2 unsuccessful attempts--remained in a-fib but rate appears controlled).  PMH includes CAD s/p CABG x2 in 8786 complicated by coronary artery dissection, diastolic dysfunction, paroxysmal a-fib, COPD, tobacco abuse, obesity, CHF.  Clinical Impression  Prior to hospital admission, pt was independent with functional mobility (uses 2 L home O2 baseline); lives in a 1 level home with level entry; no AD use typically but will hold onto furniture as needed for balance.  Currently pt is SBA with bed mobility; CGA with transfers using RW; and CGA ambulating 50 feet with RW use.  Pt reporting 6/10 chest pain beginning/during/end of session (no change during session)--pt reports having that pain since prior to coming to [facility] notified and reported pt ok to mobilize.  During ambulation, pt became dizzy after standing/walking and then also wobbly requiring CGA for safety (pt still dizzy after sitting down--BP 157/119; O2 sats 92% on 2 L O2; HR 100 bpm)--initially pt steady with step through gait pattern but after 20 feet of ambulation, pt reporting feeling dizzy and wobbly and pt then with more narrow BOS, decreased B LE step length, and mildly unsteady requiring CGA for safety rest of ambulation distance.  Pt's nurse and MD Reesa Chew notified regarding above concerns.  Pt would benefit from skilled PT to address noted impairments and functional limitations (see below for any additional details).  Upon hospital discharge, pt would benefit from HHPT and 24/7 assist with mobility for safety d/t dizziness concerns.       Recommendations for  follow up therapy are one component of a multi-disciplinary discharge planning process, led by the attending physician.  Recommendations may be updated based on patient status, additional functional criteria and insurance authorization.  Follow Up Recommendations Home health PT      Assistance Recommended at Discharge Frequent or constant Supervision/Assistance (d/t dizziness with mobility)  Patient can return home with the following  A little help with walking and/or transfers;A little help with bathing/dressing/bathroom;Assistance with cooking/housework;Assist for transportation;Help with stairs or ramp for entrance    Equipment Recommendations Rolling walker (2 wheels)  Recommendations for Other Services  OT consult    Functional Status Assessment Patient has had a recent decline in their functional status and demonstrates the ability to make significant improvements in function in a reasonable and predictable amount of time.     Precautions / Restrictions Precautions Precautions: Fall Restrictions Weight Bearing Restrictions: No      Mobility  Bed Mobility Overal bed mobility: Needs Assistance Bed Mobility: Supine to Sit, Sit to Supine     Supine to sit: Supervision, HOB elevated Sit to supine: Supervision, HOB elevated   General bed mobility comments: increased effort/time to perform on own    Transfers Overall transfer level: Needs assistance Equipment used: Rolling walker (2 wheels) Transfers: Sit to/from Stand Sit to Stand: Min guard           General transfer comment: fairly strong stand up to RW; vc's for UE placement    Ambulation/Gait Ambulation/Gait assistance: Min guard Gait Distance (Feet): 50 Feet Assistive device: Rolling walker (2 wheels)   Gait velocity: decreased     General Gait Details: initially pt steady with  step through gait pattern but after 20 feet pt reporting feeling dizzy and wobbly and pt then with more narrow BOS and decreased  B LE step length and mildly unsteady requiring CGA for safety  Stairs            Wheelchair Mobility    Modified Rankin (Stroke Patients Only)       Balance Overall balance assessment: Needs assistance Sitting-balance support: No upper extremity supported, Feet supported Sitting balance-Leahy Scale: Good Sitting balance - Comments: steady sitting reaching within BOS   Standing balance support: Single extremity supported Standing balance-Leahy Scale: Fair Standing balance comment: steady static standing with at least single UE support (pt requires at least single UE support for dynamic standing activities)                             Pertinent Vitals/Pain Pain Assessment Pain Assessment: 0-10 Pain Score: 6  Pain Location: chest pain Pain Descriptors / Indicators: Discomfort Pain Intervention(s): Limited activity within patient's tolerance, Monitored during session, Repositioned, Other (comment) (RN notified of pt's pain)    Home Living Family/patient expects to be discharged to:: Private residence Living Arrangements: Alone   Type of Home: House Home Access: Level entry       Home Layout: One level Home Equipment: Cane - single point;Grab bars - tub/shower;Shower seat - built in Additional Comments: 2 L home O2 use chronic; has dog at home (almost 64 year old)    Prior Function Prior Level of Function : Independent/Modified Independent             Mobility Comments: 1 fall in past 6 months (tripped over dog).       Hand Dominance        Extremity/Trunk Assessment   Upper Extremity Assessment Upper Extremity Assessment: Generalized weakness    Lower Extremity Assessment Lower Extremity Assessment: Generalized weakness    Cervical / Trunk Assessment Cervical / Trunk Assessment: Other exceptions Cervical / Trunk Exceptions: forward head/shoulders  Communication   Communication: No difficulties  Cognition Arousal/Alertness:  Awake/alert Behavior During Therapy: WFL for tasks assessed/performed Overall Cognitive Status: Within Functional Limits for tasks assessed                                          General Comments  Nursing cleared pt for participation in physical therapy.  Pt agreeable to PT session.    Exercises  Transfers and ambulation using RW.   Assessment/Plan    PT Assessment Patient needs continued PT services  PT Problem List Decreased strength;Decreased activity tolerance;Decreased balance;Decreased mobility;Decreased knowledge of use of DME;Decreased knowledge of precautions;Pain       PT Treatment Interventions DME instruction;Gait training;Functional mobility training;Therapeutic activities;Therapeutic exercise;Balance training;Patient/family education    PT Goals (Current goals can be found in the Care Plan section)  Acute Rehab PT Goals Patient Stated Goal: to improve strength and mobility PT Goal Formulation: With patient Time For Goal Achievement: 07/14/22 Potential to Achieve Goals: Good    Frequency Min 2X/week     Co-evaluation               AM-PAC PT "6 Clicks" Mobility  Outcome Measure Help needed turning from your back to your side while in a flat bed without using bedrails?: None Help needed moving from lying on your back to sitting on the  side of a flat bed without using bedrails?: None Help needed moving to and from a bed to a chair (including a wheelchair)?: A Little Help needed standing up from a chair using your arms (e.g., wheelchair or bedside chair)?: A Little Help needed to walk in hospital room?: A Little Help needed climbing 3-5 steps with a railing? : A Little 6 Click Score: 20    End of Session Equipment Utilized During Treatment: Gait belt;Oxygen (2 L via nasal cannula) Activity Tolerance: Other (comment) (limited d/t dizziness/feeling wobbly) Patient left: in bed;with call bell/phone within reach;with bed alarm set Nurse  Communication: Mobility status;Precautions;Other (comment) (Pt's chest pain, vitals, and symptoms) PT Visit Diagnosis: Unsteadiness on feet (R26.81);Muscle weakness (generalized) (M62.81);History of falling (Z91.81);Other abnormalities of gait and mobility (R26.89);Pain    Time: 3710-6269 PT Time Calculation (min) (ACUTE ONLY): 31 min   Charges:   PT Evaluation $PT Eval Low Complexity: 1 Low PT Treatments $Therapeutic Activity: 8-22 mins       Leitha Bleak, PT 06/30/22, 10:35 AM

## 2022-07-01 DIAGNOSIS — I480 Paroxysmal atrial fibrillation: Secondary | ICD-10-CM | POA: Diagnosis not present

## 2022-07-01 NOTE — Progress Notes (Signed)
Was able to speak with patient's son on 2nd attempt.  He states he had arranged a ride for his father yesterday (a cousin) but when she arrived she was not allowed to take him as he was being transported by ambulance.  I apologized for the mix-up and let him know [facility] wouldn't transport patient due to his oxygen tank and wheelchair.    I gave son my direct number and asked if he could call when he had arranged details.   Patient's niece Tanuj Mullens will pick pt up this evening after she gets off work at 5.

## 2022-07-01 NOTE — Plan of Care (Signed)
  Problem: Education: Goal: Knowledge of disease or condition will improve Outcome: Adequate for Discharge Goal: Understanding of medication regimen will improve Outcome: Adequate for Discharge   Problem: Activity: Goal: Ability to tolerate increased activity will improve Outcome: Adequate for Discharge   Problem: Cardiac: Goal: Ability to achieve and maintain adequate cardiopulmonary perfusion will improve Outcome: Adequate for Discharge   Problem: Health Behavior/Discharge Planning: Goal: Ability to safely manage health-related needs after discharge will improve Outcome: Adequate for Discharge

## 2022-07-01 NOTE — Progress Notes (Signed)
Physical Therapy Treatment Patient Details Name: Lawrence [name] MRN: [id number] DOB: 1955-11-25 Today's Date: 07/01/2022   History of Present Illness Pt is a 66 y.o. male presenting to hospital for evaluation of chest pain (palpitations, dyspnea, and productive cough).  Pt admitted with a-fib with RVR, hypokalemia, chest pain, and acute bronchitis.  S/p cardioversion 06/29/22 (2 unsuccessful attempts--remained in a-fib but rate appears controlled).  PMH includes CAD s/p CABG x2 in 7564 complicated by coronary artery dissection, diastolic dysfunction, paroxysmal a-fib, COPD, tobacco abuse, obesity, CHF.    PT Comments    Pt continues to require overall CGA for functional mobility.  Pt made some progress increasing gait distance slightly with RW and 2L02 but is still limited by fatigue.  Current d/c plan is appropriate.   Recommendations for follow up therapy are one component of a multi-disciplinary discharge planning process, led by the attending physician.  Recommendations may be updated based on patient status, additional functional criteria and insurance authorization.  Follow Up Recommendations  Home health PT     Assistance Recommended at Discharge Frequent or constant Supervision/Assistance  Patient can return home with the following A little help with walking and/or transfers;A little help with bathing/dressing/bathroom;Assistance with cooking/housework;Assist for transportation;Help with stairs or ramp for entrance   Equipment Recommendations  Rolling walker (2 wheels)    Recommendations for Other Services       Precautions / Restrictions Precautions Precautions: Fall Restrictions Weight Bearing Restrictions: No     Mobility  Bed Mobility Overal bed mobility: Modified Independent Bed Mobility: Supine to Sit, Sit to Supine     Supine to sit: HOB elevated, Modified independent (Device/Increase time)     General bed mobility comments: increased effort/time to perform on  own, no cues needed; HOB slightly elevated.    Transfers Overall transfer level: Needs assistance Equipment used: Rolling walker (2 wheels) Transfers: Sit to/from Stand Sit to Stand: Supervision           General transfer comment: fairly strong stand up to RW; vc's for UE placement    Ambulation/Gait Ambulation/Gait assistance: Min guard Gait Distance (Feet): 70 Feet Assistive device: Rolling walker (2 wheels) Gait Pattern/deviations: Step-through pattern, Decreased stride length, Narrow base of support Gait velocity: decreased     General Gait Details: reported fatigue after about 50 ft. pt tends to lean right with turnining, cues to increase BOS esp. with turns.   Stairs             Wheelchair Mobility    Modified Rankin (Stroke Patients Only)       Balance Overall balance assessment: Modified Independent Sitting-balance support: No upper extremity supported, Feet supported Sitting balance-Leahy Scale: Good Sitting balance - Comments: steady sitting reaching within BOS   Standing balance support: Single extremity supported Standing balance-Leahy Scale: Fair Standing balance comment: steady static standing with at least single UE support (pt requires at least single UE support for dynamic standing activities)                            Cognition Arousal/Alertness: Awake/alert Behavior During Therapy: WFL for tasks assessed/performed Overall Cognitive Status: Within Functional Limits for tasks assessed                                          Exercises Other Exercises Other Exercises: LAQ and ankle pumps  x10    General Comments        Pertinent Vitals/Pain Pain Assessment Pain Assessment: Faces Faces Pain Scale: Hurts a little bit Pain Location: chest pain Pain Descriptors / Indicators: Discomfort    Home Living                          Prior Function            PT Goals (current goals can now be  found in the care plan section) Acute Rehab PT Goals Patient Stated Goal: to improve strength and mobility PT Goal Formulation: With patient Time For Goal Achievement: 07/14/22 Potential to Achieve Goals: Good Progress towards PT goals: Progressing toward goals    Frequency    Min 2X/week      PT Plan Current plan remains appropriate    Co-evaluation              AM-PAC PT "6 Clicks" Mobility   Outcome Measure  Help needed turning from your back to your side while in a flat bed without using bedrails?: None Help needed moving from lying on your back to sitting on the side of a flat bed without using bedrails?: A Little Help needed moving to and from a bed to a chair (including a wheelchair)?: A Little Help needed standing up from a chair using your arms (e.g., wheelchair or bedside chair)?: A Little Help needed to walk in hospital room?: A Little Help needed climbing 3-5 steps with a railing? : A Little 6 Click Score: 19    End of Session Equipment Utilized During Treatment: Gait belt;Oxygen Activity Tolerance: Patient limited by fatigue Patient left: in bed;with call bell/phone within reach;with bed alarm set Nurse Communication: Mobility status PT Visit Diagnosis: Unsteadiness on feet (R26.81);Muscle weakness (generalized) (M62.81);History of falling (Z91.81);Other abnormalities of gait and mobility (R26.89);Pain     Time: 5537-4827 PT Time Calculation (min) (ACUTE ONLY): 21 min  Charges:  $Therapeutic Activity: 8-22 mins                     Bjorn Loser, PTA  07/01/22, 11:22 AM

## 2022-07-01 NOTE — Progress Notes (Signed)
No charge progress note.  Patient with no acute concern.  He was discharged yesterday but apparently required EMS for going home.  EMS refused to take him home as he has to take oxygen tank with him. A family member will transport him back home today. -Continue with current plan and management

## 2022-07-02 ENCOUNTER — Telehealth: Payer: Self-pay

## 2022-07-02 NOTE — Telephone Encounter (Addendum)
Transition Care Management Unsuccessful Follow-up Telephone Call  Date of discharge and from where:  TCM DC [facility] 07-01-22 Dx: A-fib with RVR   Attempts:  1st Attempt  Reason for unsuccessful TCM follow-up call:  Left voice message   Transition Care Management Unsuccessful Follow-up Telephone Call  Date of discharge and from where:  TCM DC [facility] 07-01-22 Dx: A-fib with RVR   Attempts:  2nd Attempt  Reason for unsuccessful TCM follow-up call:  Left voice message

## 2022-07-03 ENCOUNTER — Other Ambulatory Visit: Payer: Self-pay

## 2022-07-03 ENCOUNTER — Emergency Department: Payer: Medicare HMO

## 2022-07-03 ENCOUNTER — Emergency Department
Admission: EM | Admit: 2022-07-03 | Discharge: 2022-07-03 | Disposition: A | Discharge status: Home / Self Care | Payer: Medicare HMO | Attending: Emergency Medicine | Admitting: Emergency Medicine

## 2022-07-03 DIAGNOSIS — R0902 Hypoxemia: Secondary | ICD-10-CM | POA: Diagnosis not present

## 2022-07-03 DIAGNOSIS — R079 Chest pain, unspecified: Secondary | ICD-10-CM | POA: Diagnosis not present

## 2022-07-03 DIAGNOSIS — I509 Heart failure, unspecified: Secondary | ICD-10-CM | POA: Diagnosis not present

## 2022-07-03 DIAGNOSIS — R0789 Other chest pain: Secondary | ICD-10-CM | POA: Insufficient documentation

## 2022-07-03 DIAGNOSIS — R609 Edema, unspecified: Secondary | ICD-10-CM | POA: Diagnosis not present

## 2022-07-03 DIAGNOSIS — I4891 Unspecified atrial fibrillation: Secondary | ICD-10-CM | POA: Diagnosis not present

## 2022-07-03 DIAGNOSIS — Z20822 Contact with and (suspected) exposure to covid-19: Secondary | ICD-10-CM | POA: Insufficient documentation

## 2022-07-03 DIAGNOSIS — R42 Dizziness and giddiness: Secondary | ICD-10-CM | POA: Diagnosis not present

## 2022-07-03 DIAGNOSIS — J449 Chronic obstructive pulmonary disease, unspecified: Secondary | ICD-10-CM | POA: Diagnosis not present

## 2022-07-03 DIAGNOSIS — E876 Hypokalemia: Secondary | ICD-10-CM | POA: Diagnosis not present

## 2022-07-03 LAB — CBC
HCT: 60.4 % — ABNORMAL HIGH (ref 39.0–52.0)
Hemoglobin: 19.3 g/dL — ABNORMAL HIGH (ref 13.0–17.0)
MCH: 29.3 pg (ref 26.0–34.0)
MCHC: 32 g/dL (ref 30.0–36.0)
MCV: 91.7 fL (ref 80.0–100.0)
Platelets: 353 10*3/uL (ref 150–400)
RBC: 6.59 MIL/uL — ABNORMAL HIGH (ref 4.22–5.81)
RDW: 15.2 % (ref 11.5–15.5)
WBC: 10.3 10*3/uL (ref 4.0–10.5)
nRBC: 0 % (ref 0.0–0.2)

## 2022-07-03 LAB — BASIC METABOLIC PANEL
Anion gap: 13 (ref 5–15)
BUN: 13 mg/dL (ref 8–23)
CO2: 33 mmol/L — ABNORMAL HIGH (ref 22–32)
Calcium: 10.1 mg/dL (ref 8.9–10.3)
Chloride: 95 mmol/L — ABNORMAL LOW (ref 98–111)
Creatinine, Ser: 1.07 mg/dL (ref 0.61–1.24)
GFR, Estimated: >60 mL/min (ref >60)
Glucose, Bld: 204 mg/dL — ABNORMAL HIGH (ref 70–99)
Potassium: 3.1 mmol/L — ABNORMAL LOW (ref 3.5–5.1)
Sodium: 141 mmol/L (ref 135–145)

## 2022-07-03 LAB — SARS CORONAVIRUS 2 BY RT PCR: SARS Coronavirus 2 by RT PCR: NEGATIVE

## 2022-07-03 LAB — BRAIN NATRIURETIC PEPTIDE: B Natriuretic Peptide: 209.5 pg/mL — ABNORMAL HIGH (ref 0.0–100.0)

## 2022-07-03 LAB — TROPONIN I (HIGH SENSITIVITY)
Troponin I (High Sensitivity): 20 ng/L — ABNORMAL HIGH (ref <18)
Troponin I (High Sensitivity): 17 ng/L (ref <18)

## 2022-07-03 MED ORDER — MORPHINE SULFATE (PF) 4 MG/ML IV SOLN
4.0000 mg | Freq: Once | INTRAVENOUS | Status: AC
  Administered 2022-07-03: 4 mg via INTRAVENOUS
  Filled 2022-07-03: qty 1

## 2022-07-03 MED ORDER — ONDANSETRON HCL 4 MG/2ML IJ SOLN
4.0000 mg | Freq: Once | INTRAMUSCULAR | Status: AC
  Administered 2022-07-03: 4 mg via INTRAVENOUS
  Filled 2022-07-03: qty 2

## 2022-07-03 NOTE — ED Notes (Signed)
First nurse note:  Pt presents to ED via [facility] with c/o of CP, pt states recently D/C'ed for the same. EMS states 12 lead shows a-fib with normal rate.   BP:150/104 HR: 70 O2-90%  CBG 167  Pt took 3 sublingual nitros, 324 of ASA PTA EMS arrival.   18 R FA  EMS gave 2 additional nitro sprays 10 minutes apart.

## 2022-07-03 NOTE — Discharge Instructions (Addendum)
Please call Dr. Etta Quill office and schedule some follow-upin the next few days.  Do not hesitate to return for increasing chest pain fever shortness of breath or other problems.  For now though your troponins are you okay and your EKGs look okay and you should be out of do okay at home with the medicines you have.

## 2022-07-03 NOTE — ED Provider Notes (Signed)
[facility] Provider Note    Event Date/Time   First MD Initiated Contact with Patient 07/03/22 1310     (approximate)   History   Chest Pain   HPI  Lawrence [name] is a 66 y.o. male   Pt reports he can do iv morhpine but not po morphine Patient complains of pain in his chest that is heavy and sometimes tight.  It comes on without any warning when he is resting or when he is not resting.  Often it gets worse when he exercises.  He reports he is having some now.  He was just in the hospital for A-fib with RVR and he said something about having to have it shocked and reviewing his records shows he did have cardioversion.     Physical Exam   Triage Vital Signs: ED Triage Vitals  Enc Vitals Group     BP 07/03/22 1228 136/88     Pulse Rate 07/03/22 1228 (!) 39     Resp 07/03/22 1227 18     Temp 07/03/22 1227 97.7 F (36.5 C)     Temp Source 07/03/22 1227 Oral     SpO2 07/03/22 1230 (!) 88 %     Weight 07/03/22 1228 188 lb 4.4 oz (85.4 kg)     Height 07/03/22 1228 5' 7"  (1.702 m)     Head Circumference --      Peak Flow --      Pain Score 07/03/22 1227 8     Pain Loc --      Pain Edu? --      Excl. in [place]? --     Most recent vital signs: Vitals:   07/03/22 1600 07/03/22 1627  BP: (!) 152/105   Pulse: (!) 106   Resp: 13   Temp:  98 F (36.7 C)  SpO2: 94%      General: Awake, no distress.  CV:  Good peripheral perfusion.  Heart regular rate and rhythm no audible murmurs patient is having A-fib but again it regular rate and rhythm is what I hear Resp:  Normal effort.  Lungs are clear with occasional scattered crackles Abd:  No distention.  Soft and nontender Extremities: Slight trace edema.   ED Results / Procedures / Treatments   Labs (all labs ordered are listed, but only abnormal results are displayed) Labs Reviewed  BASIC METABOLIC PANEL - Abnormal; Notable for the following components:      Result Value   Potassium 3.1 (*)     Chloride 95 (*)    CO2 33 (*)    Glucose, Bld 204 (*)    All other components within normal limits  CBC - Abnormal; Notable for the following components:   RBC 6.59 (*)    Hemoglobin 19.3 (*)    HCT 60.4 (*)    All other components within normal limits  BRAIN NATRIURETIC PEPTIDE - Abnormal; Notable for the following components:   B Natriuretic Peptide 209.5 (*)    All other components within normal limits  TROPONIN I (HIGH SENSITIVITY) - Abnormal; Notable for the following components:   Troponin I (High Sensitivity) 20 (*)    All other components within normal limits  SARS CORONAVIRUS 2 BY RT PCR  TROPONIN I (HIGH SENSITIVITY)     EKG  EKG #1 shows A-fib at a rate of 108 normal axis Q's in inferiorly which have been present previously poor R wave progression which has been present previously similar to EKG  from 06/05/2022 EKG #2 shows A-fib or flutter at a rate of 88 normal axis still nonspecific ST-T wave changes similar to #1  RADIOLOGY Chest x-ray read by radiology reviewed and interpreted by me shows no acute disease  PROCEDURES:  Critical Care performed:   Procedures   MEDICATIONS ORDERED IN ED: Medications  morphine (PF) 4 MG/ML injection 4 mg (has no administration in time range)  morphine (PF) 4 MG/ML injection 4 mg (4 mg Intravenous Given 07/03/22 1417)  ondansetron (ZOFRAN) injection 4 mg (4 mg Intravenous Given 07/03/22 1417)     IMPRESSION / MDM / ASSESSMENT AND PLAN / ED COURSE  Nurses notes reviewed patient's labs and old records reviewed Discussed labs patient's history EKGs etc. with Dr. Saralyn Pilar.  Both of [place] feel patient is okay for outpatient management his troponin has been 6 likely going down from 20-17 patient's chest x-ray is clear he is not hypoxic his electrolytes are okay his H&H is actually high possibly because of his COPD and CHF. Differential diagnosis includes, but is not limited to, patient may be having cardiac pain but he may also be having  chest wall pain or other discomfort.  Patient's presentation is most consistent with exacerbation of chronic illness. The patient is on the cardiac monitor to evaluate for evidence of arrhythmia and/or significant heart rate changes none have been seen   Patient will return if he is any worse.  FINAL CLINICAL IMPRESSION(S) / ED DIAGNOSES   Final diagnoses:  Nonspecific chest pain     Rx / DC Orders   ED Discharge Orders     None        Note:  This document was prepared using Dragon voice recognition software and may include unintentional dictation errors.   Nena Polio, MD 07/03/22 1723

## 2022-07-03 NOTE — ED Triage Notes (Signed)
Signed        Pt here via [facility] from home with CP. Pt states he was just discharged from this facility yesterday and he began having cp shortly after.

## 2022-07-06 NOTE — Telephone Encounter (Signed)
Transition Care Management Follow-up Telephone Call Date of discharge and from where: TCM DC [facility] 07-01-22 Dx: A-fib with RVR  How have you been since you were released from the hospital? Doing ok  Any questions or concerns? No  Items Reviewed: Did the pt receive and understand the discharge instructions provided? Yes  Medications obtained and verified? Yes  Other? No  Any new allergies since your discharge? No  Dietary orders reviewed? Yes Do you have support at home? Yes   Home Care and Equipment/Supplies: Were home health services ordered? Yes- PT If so, what is the name of the agency? [facility]   Has the agency set up a time to come to the patient's home? yes Were any new equipment or medical supplies ordered?  Yes- Rolling walker  What [facility] the [facility] of the medical supply agency? [facility]  Were you able to get the supplies/equipment? yes Do you have any questions related to the use of the equipment or supplies? No  Functional Questionnaire: (I = Independent and D = Dependent) ADLs: I  Bathing/Dressing- I  Meal Prep- I  Eating- I  Maintaining continence- I  Transferring/Ambulation- I  Managing Meds- I  Follow up appointments reviewed:  PCP Hospital f/u appt confirmed? Yes  Scheduled to see Dr Ky Barban on 07-07-22 @ [facility] f/u appt confirmed? No- pt is aware to make fu appt with Dr Clayborn Bigness Are transportation arrangements needed? No  If their condition worsens, is the pt aware to call PCP or go to the Emergency Dept.? Yes Was the patient provided with contact information for the PCP's office or ED? Yes Was to pt encouraged to call back with questions or concerns? Yes

## 2022-07-07 ENCOUNTER — Inpatient Hospital Stay: Payer: Medicare HMO | Admitting: Family Medicine

## 2022-07-07 ENCOUNTER — Emergency Department: Payer: Medicare HMO

## 2022-07-07 ENCOUNTER — Observation Stay
Admission: EM | Admit: 2022-07-07 | Discharge: 2022-07-08 | Disposition: A | Discharge status: Home / Self Care | Payer: Medicare HMO | Attending: Internal Medicine | Admitting: Internal Medicine

## 2022-07-07 ENCOUNTER — Other Ambulatory Visit: Payer: Self-pay

## 2022-07-07 ENCOUNTER — Ambulatory Visit: Payer: Self-pay | Admitting: *Deleted

## 2022-07-07 ENCOUNTER — Encounter: Payer: Self-pay | Admitting: Emergency Medicine

## 2022-07-07 DIAGNOSIS — E1121 Type 2 diabetes mellitus with diabetic nephropathy: Secondary | ICD-10-CM | POA: Diagnosis present

## 2022-07-07 DIAGNOSIS — Z7901 Long term (current) use of anticoagulants: Secondary | ICD-10-CM | POA: Diagnosis not present

## 2022-07-07 DIAGNOSIS — Z9981 Dependence on supplemental oxygen: Secondary | ICD-10-CM | POA: Diagnosis not present

## 2022-07-07 DIAGNOSIS — R42 Dizziness and giddiness: Secondary | ICD-10-CM | POA: Diagnosis not present

## 2022-07-07 DIAGNOSIS — Z79899 Other long term (current) drug therapy: Secondary | ICD-10-CM | POA: Diagnosis not present

## 2022-07-07 DIAGNOSIS — N182 Chronic kidney disease, stage 2 (mild): Secondary | ICD-10-CM | POA: Diagnosis not present

## 2022-07-07 DIAGNOSIS — I5043 Acute on chronic combined systolic (congestive) and diastolic (congestive) heart failure: Secondary | ICD-10-CM | POA: Insufficient documentation

## 2022-07-07 DIAGNOSIS — Z7984 Long term (current) use of oral hypoglycemic drugs: Secondary | ICD-10-CM | POA: Diagnosis not present

## 2022-07-07 DIAGNOSIS — R0789 Other chest pain: Principal | ICD-10-CM | POA: Insufficient documentation

## 2022-07-07 DIAGNOSIS — I5023 Acute on chronic systolic (congestive) heart failure: Secondary | ICD-10-CM

## 2022-07-07 DIAGNOSIS — R079 Chest pain, unspecified: Secondary | ICD-10-CM | POA: Diagnosis present

## 2022-07-07 DIAGNOSIS — M5410 Radiculopathy, site unspecified: Secondary | ICD-10-CM | POA: Diagnosis present

## 2022-07-07 DIAGNOSIS — I4891 Unspecified atrial fibrillation: Secondary | ICD-10-CM | POA: Insufficient documentation

## 2022-07-07 DIAGNOSIS — R000 Tachycardia, unspecified: Secondary | ICD-10-CM | POA: Diagnosis not present

## 2022-07-07 DIAGNOSIS — Z85828 Personal history of other malignant neoplasm of skin: Secondary | ICD-10-CM | POA: Diagnosis not present

## 2022-07-07 DIAGNOSIS — J441 Chronic obstructive pulmonary disease with (acute) exacerbation: Secondary | ICD-10-CM | POA: Diagnosis present

## 2022-07-07 DIAGNOSIS — Z7982 Long term (current) use of aspirin: Secondary | ICD-10-CM | POA: Diagnosis not present

## 2022-07-07 DIAGNOSIS — I480 Paroxysmal atrial fibrillation: Secondary | ICD-10-CM | POA: Diagnosis present

## 2022-07-07 DIAGNOSIS — I2510 Atherosclerotic heart disease of native coronary artery without angina pectoris: Secondary | ICD-10-CM | POA: Diagnosis not present

## 2022-07-07 DIAGNOSIS — Z951 Presence of aortocoronary bypass graft: Secondary | ICD-10-CM | POA: Diagnosis not present

## 2022-07-07 DIAGNOSIS — E1140 Type 2 diabetes mellitus with diabetic neuropathy, unspecified: Secondary | ICD-10-CM | POA: Diagnosis not present

## 2022-07-07 DIAGNOSIS — I130 Hypertensive heart and chronic kidney disease with heart failure and stage 1 through stage 4 chronic kidney disease, or unspecified chronic kidney disease: Secondary | ICD-10-CM | POA: Insufficient documentation

## 2022-07-07 DIAGNOSIS — M5416 Radiculopathy, lumbar region: Secondary | ICD-10-CM | POA: Insufficient documentation

## 2022-07-07 DIAGNOSIS — R0602 Shortness of breath: Secondary | ICD-10-CM | POA: Diagnosis not present

## 2022-07-07 DIAGNOSIS — Z87891 Personal history of nicotine dependence: Secondary | ICD-10-CM | POA: Diagnosis not present

## 2022-07-07 DIAGNOSIS — Z20822 Contact with and (suspected) exposure to covid-19: Secondary | ICD-10-CM | POA: Insufficient documentation

## 2022-07-07 DIAGNOSIS — I10 Essential (primary) hypertension: Secondary | ICD-10-CM | POA: Diagnosis not present

## 2022-07-07 DIAGNOSIS — I209 Angina pectoris, unspecified: Secondary | ICD-10-CM

## 2022-07-07 DIAGNOSIS — J45909 Unspecified asthma, uncomplicated: Secondary | ICD-10-CM | POA: Insufficient documentation

## 2022-07-07 DIAGNOSIS — J9611 Chronic respiratory failure with hypoxia: Secondary | ICD-10-CM | POA: Diagnosis present

## 2022-07-07 LAB — CBC
HCT: 50.8 % (ref 39.0–52.0)
Hemoglobin: 16.7 g/dL (ref 13.0–17.0)
MCH: 30.1 pg (ref 26.0–34.0)
MCHC: 32.9 g/dL (ref 30.0–36.0)
MCV: 91.5 fL (ref 80.0–100.0)
Platelets: 228 10*3/uL (ref 150–400)
RBC: 5.55 MIL/uL (ref 4.22–5.81)
RDW: 14.4 % (ref 11.5–15.5)
WBC: 8.5 10*3/uL (ref 4.0–10.5)
nRBC: 0 % (ref 0.0–0.2)

## 2022-07-07 NOTE — ED Provider Notes (Incomplete)
[facility] Provider Note    Event Date/Time   First MD Initiated Contact with Patient 07/07/22 2321     (approximate)   History   Chest Pain   HPI  Lawrence [name] is a 66 y.o. male  with pmh CAD s/p CABG, chronic diastolic heart failure, COPD on 2L [place] as needed, PSVT who presents with chest pain.  Patient notes he has been feeling short of breath for the last day.  Then tonight around 10 PM felt like he went into A-fib as he describes palpitations and at the same time developed sharp, stabbing type pain on the left side of his chest.  Pain does not radiate.  Does feel worse when he walks around.  Is not pleuritic.  Has been constant unchanged.  Has the pain currently.  He has had a cough today that is productive of clear sputum no fevers or chills no abdominal pain.  Did feel some nausea with the onset of the chest pain.     Past Medical History:  Diagnosis Date  . A-fib ([place])   . Anemia   . Anginal pain ([place])   . Anxiety   . Arthritis   . Asthma   . CAD (coronary artery disease)    a. 2002 CABGx2 (LIMA->LAD, VG->VG->OM1);  b. 09/2012 DES->OM;  c. 03/2015 PTCA of LAD [facility]) in setting of atretic LIMA; d. 05/2015 Cath [facility]): nonobs dzs; e. 06/2015 Cath ([facility]): LM nl, LAD 45p/d ISR, 50d, D1/2 small, LCX 50p/d ISR, OM1 70ost, 30 ISR, VG->OM1 50ost, 82m LIMA->LAD 99p/d - atretic, RCA dom, nl; f.cath 10/16: 40-50%(FFR 0.90) pLAD, 75% (FFR 0.77) mLAD s/p PCI/DES, oRCA 40% (FFR0.95)  . Cancer ([place]    SKIN CANCER ON BACK  . Celiac disease   . Chronic diastolic CHF (congestive heart failure) (HW[place]    a. 06/2009 Echo: EF 60-65%, Gr 1 DD, triv AI, mildly dil LA, nl RV.  .Marland KitchenCOPD (chronic obstructive pulmonary disease) (HNorth Fort Lewis    a. Chronic bronchitis and emphysema.  . DDD (degenerative disc disease), lumbar   . Diverticulosis   . Dysrhythmia   . Essential hypertension   . GERD (gastroesophageal reflux disease)   . History of hiatal hernia   . History of kidney  stones    H/O  . History of tobacco abuse    a. Quit 2014.  .Marland KitchenMyocardial infarction (HBayside 2002   4 STENTS  . Pancreatitis   . PSVT (paroxysmal supraventricular tachycardia) (H[place]    a. 10/2012 Noted on Zio Patch.  . Sleep apnea    LOST CORD TO CPAP -ONLY 02 @ BEDTIME  . Tubular adenoma of colon   . Type II diabetes mellitus ([facility]     Patient Active Problem List   Diagnosis Date Noted  . Atrial fibrillation with RVR ([place] 06/28/2022  . Essential hypertension 06/28/2022  . Dyslipidemia 06/28/2022  . Hypokalemia 06/28/2022  . Acute bronchitis 06/28/2022  . Chronic diastolic HF (heart failure) (H[place]   . Atherosclerosis of aorta (HD[place] 12/16/2020  . Syncope 12/11/2020  . Polycythemia 10/05/2020  . Primary insomnia 05/13/2020  . Recurrent major depressive disorder, remission status unspecified (HK[place] 03/29/2020  . Chronic respiratory failure with hypoxia (H[place] 05/29/2019  . Trigger thumb of right hand 11/28/2018  . History of adenomatous polyp of colon 04/05/2018  . CKD (chronic kidney disease) stage 2, GFR 60-89 ml/min 04/03/2016  . Anemia 04/03/2016  . Atrial fibrillation (H[place] 12/23/2015  . OSA (obstructive sleep apnea) 12/10/2015  .  Left inguinal hernia 11/07/2015  . Anxiety 11/07/2015  . Back pain with left-sided radiculopathy 09/30/2015  . Nocturnal hypoxia 09/06/2015  . BPH (benign prostatic hyperplasia) 08/01/2015  . Chronic systolic CHF (congestive heart failure) ([place])   . Angina pectoris ([place])   . Chest pain 07/11/2015  . COPD (chronic obstructive pulmonary disease) ([place]) 07/03/2015  . CAD (coronary artery disease) 06/26/2015  . Primary hypertension 06/26/2015  . 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[place]) 06/26/2015  . Achalasia 07/24/2014  . GERD (gastroesophageal reflux disease) 06/07/2014  . Former tobacco use 04/11/2013  . HLD (hyperlipidemia) 04/09/2013     Physical Exam  Triage Vital Signs: ED Triage Vitals  Enc Vitals Group     BP 07/07/22  2310 (!) 161/122     Pulse Rate 07/07/22 2310 (!) 51     Resp 07/07/22 2310 12     Temp 07/07/22 2310 97.8 F (36.6 C)     Temp Source 07/07/22 2310 Oral     SpO2 07/07/22 2310 98 %     Weight 07/07/22 2312 190 lb (86.2 kg)     Height 07/07/22 2312 5' 7"  (1.702 m)     Head Circumference --      Peak Flow --      Pain Score 07/07/22 2312 9     Pain Loc --      Pain Edu? --      Excl. in [place]? --     Most recent vital signs: Vitals:   07/07/22 2310  BP: (!) 161/122  Pulse: (!) 51  Resp: 12  Temp: 97.8 F (36.6 C)  SpO2: 98%     General: Awake, no distress.  CV:  Good peripheral perfusion.  No peripheral edema Resp:  Normal effort.  Patient has expiratory wheezing throughout but he is speaking in full sentences no increased work of breathing Abd:  No distention.  Neuro:             Awake, Alert, Oriented x 3  Other:     ED Results / Procedures / Treatments  Labs (all labs ordered are listed, but only abnormal results are displayed) Labs Reviewed  BASIC METABOLIC PANEL  CBC  TROPONIN I (HIGH SENSITIVITY)     EKG  EKG interpretation performed by myself: afib, nml axis, nml intervals, no acute ischemic changes    RADIOLOGY ***   PROCEDURES:  Critical Care performed: {CriticalCareYesNo:19197::"Yes, see critical care procedure note(s)","No"}  Procedures {If the patient is on the monitor, remove the brackets and asterisks. Otherwise delete the sentence below:1} The patient is on the cardiac monitor to evaluate for evidence of arrhythmia and/or significant heart rate changes.   MEDICATIONS ORDERED IN ED: Medications - No data to display   IMPRESSION / MDM / [place] / ED COURSE  I reviewed the triage vital signs and the nursing notes.                              Patient's presentation is most consistent with {EM COPA:27473}  Differential diagnosis includes, but is not limited to, ***       FINAL CLINICAL IMPRESSION(S) / ED DIAGNOSES    Final diagnoses:  None     Rx / DC Orders   ED Discharge Orders     None        Note:  This document was prepared using Dragon voice recognition software and may include unintentional dictation errors.

## 2022-07-07 NOTE — Progress Notes (Deleted)
    SUBJECTIVE:   CHIEF COMPLAINT / HPI:   HOSPITAL FOLLOW UP  Hospital/facility: [facility] 8/13-8/16 Diagnosis: Afib w/ RVR Procedures/tests:  - cardioversion x2 - unsuccessful Consultants: Cardiology New medications:  - increased amiodarone Discharge instructions:   - HHPT - DME walker Status: {Blank multiple:19196::"better","worse","stable","fluctuating"}   OBJECTIVE:   There were no vitals taken for this visit.  ***  ASSESSMENT/PLAN:   No problem-specific Assessment & Plan notes found for this encounter.     Myles Gip, DO

## 2022-07-07 NOTE — ED Triage Notes (Signed)
Pt to ED via EMS from home c/o left chest pain radiating left shoulder approx 1hr PTA, pt took 324 ASA at home, pain is sharp and heavy, SOB and cough.  Hx of afib, COPD, CABG x2, stents x4, Dr. Clayborn Bigness is cardiologist.  Pt recently changed 2 weeks ago from metoprolol to taking amiodorone.  Pt wears 2L [place] as needed, is requesting 2L at this time for comfort, RA sats 98%.

## 2022-07-07 NOTE — Telephone Encounter (Signed)
  Chief Complaint: Hypertension Symptoms: per Milta Deiters' OT with [facility] BP this afternoon 170/110 HR 70's-90's at rest. Denies CP. Did go to ED last Friday with BP elevated and CP, clammy per Milta Deiters. OT not with pt presently. Called pt for triage. Pt states CP occurs daily, varies in intensity, radiates to left arm at times. States ED tells him "It's my Afib, no attack."Reports increased fatigue ad generalized weakness. Pt was to have hospital F/U appt today with Dr. Seleta Rhymes not have transportation.States cardiologist called today and  rescheduled appt from this Thursday to 07/14/22. Advised to alert cardiologist to OTs findings today. Called practice for consult, [place]. Will route summary for review and final disposition. Frequency: Unsure, ED last Friday 07/03/22 Pertinent Negatives: Patient denies CP presently Disposition: [] ED /[] Urgent Care (no appt availability in office) / [] Appointment(In office/virtual)/ []  [facility]/ [] Home Care/ [] Refused Recommended Disposition /[] [facility]/ [x]  Follow-up with PCP Additional Notes: Please advise. Care advise provided. Advised ED for worsening symptoms. Pt verbalizes understanding. Reason for Disposition  Systolic BP  >= 128 OR Diastolic >= 786  Answer Assessment - Initial Assessment Questions 1. BLOOD PRESSURE: "What is the blood pressure?" "Did you take at least two measurements 5 minutes apart?"     Per OT 170/110 today before therapy 2. ONSET: "When did you take your blood pressure?"     With OT today 3. HOW: "How did you take your blood pressure?" (e.g., automatic home BP monitor, visiting nurse)     [place] 4. HISTORY: "Do you have a history of high blood pressure?"     yes 5. MEDICINES: "Are you taking any medicines for blood pressure?" "Have you missed any doses recently?"     no 6. OTHER SYMPTOMS: "Do you have any symptoms?" (e.g., blurred vision, chest pain, difficulty breathing, headache, weakness)      Increased fatigue and weakness, CP comes and goes, middle chest. At times to left arm. ED said Afib, not MI  Protocols used: Blood Pressure - High-A-AH

## 2022-07-07 NOTE — ED Provider Notes (Signed)
[facility] Provider Note    Event Date/Time   First MD Initiated Contact with Patient 07/07/22 2321     (approximate)   History   Chest Pain   HPI  Lawrence [name] is a 66 y.o. male  with pmh CAD s/p CABG, chronic diastolic heart failure, COPD on 2L [place] as needed, PSVT who presents with chest pain.      Past Medical History:  Diagnosis Date  . A-fib ([place])   . Anemia   . Anginal pain ([place])   . Anxiety   . Arthritis   . Asthma   . CAD (coronary artery disease)    a. 2002 CABGx2 (LIMA->LAD, VG->VG->OM1);  b. 09/2012 DES->OM;  c. 03/2015 PTCA of LAD [facility]) in setting of atretic LIMA; d. 05/2015 Cath [facility]): nonobs dzs; e. 06/2015 Cath ([facility]): LM nl, LAD 45p/d ISR, 50d, D1/2 small, LCX 50p/d ISR, OM1 70ost, 30 ISR, VG->OM1 50ost, 39m LIMA->LAD 99p/d - atretic, RCA dom, nl; f.cath 10/16: 40-50%(FFR 0.90) pLAD, 75% (FFR 0.77) mLAD s/p PCI/DES, oRCA 40% (FFR0.95)  . Cancer ([place]    SKIN CANCER ON BACK  . Celiac disease   . Chronic diastolic CHF (congestive heart failure) (HTilden    a. 06/2009 Echo: EF 60-65%, Gr 1 DD, triv AI, mildly dil LA, nl RV.  .Marland KitchenCOPD (chronic obstructive pulmonary disease) (HDelavan    a. Chronic bronchitis and emphysema.  . DDD (degenerative disc disease), lumbar   . Diverticulosis   . Dysrhythmia   . Essential hypertension   . GERD (gastroesophageal reflux disease)   . History of hiatal hernia   . History of kidney stones    H/O  . History of tobacco abuse    a. Quit 2014.  .Marland KitchenMyocardial infarction (HSultana 2002   4 STENTS  . Pancreatitis   . PSVT (paroxysmal supraventricular tachycardia) (HMims    a. 10/2012 Noted on Zio Patch.  . Sleep apnea    LOST CORD TO CPAP -ONLY 02 @ BEDTIME  . Tubular adenoma of colon   . Type II diabetes mellitus ([facility]     Patient Active Problem List   Diagnosis Date Noted  . Atrial fibrillation with RVR (HNorth Scit[place] 06/28/2022  . Essential hypertension 06/28/2022  . Dyslipidemia 06/28/2022  . Hypokalemia  06/28/2022  . Acute bronchitis 06/28/2022  . Chronic diastolic HF (heart failure) (HAlma   . Atherosclerosis of aorta (HSan Clemente 12/16/2020  . Syncope 12/11/2020  . Polycythemia 10/05/2020  . Primary insomnia 05/13/2020  . Recurrent major depressive disorder, remission status unspecified (HLovettsville 03/29/2020  . Chronic respiratory failure with hypoxia (HWill[place] 05/29/2019  . Trigger thumb of right hand 11/28/2018  . History of adenomatous polyp of colon 04/05/2018  . CKD (chronic kidney disease) stage 2, GFR 60-89 ml/min 04/03/2016  . Anemia 04/03/2016  . Atrial fibrillation (H[place] 12/23/2015  . OSA (obstructive sleep apnea) 12/10/2015  . Left inguinal hernia 11/07/2015  . Anxiety 11/07/2015  . Back pain with left-sided radiculopathy 09/30/2015  . Nocturnal hypoxia 09/06/2015  . BPH (benign prostatic hyperplasia) 08/01/2015  . Chronic systolic CHF (congestive heart failure) (HChe[place]   . Angina pectoris (H[place]   . Chest pain 07/11/2015  . COPD (chronic obstructive pulmonary disease) (HLake Helen 07/03/2015  . CAD (coronary artery disease) 06/26/2015  . Primary hypertension 06/26/2015  . 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HBladens[place] 06/26/2015  . Achalasia 07/24/2014  . GERD (gastroesophageal reflux disease) 06/07/2014  . Former tobacco use 04/11/2013  . HLD (hyperlipidemia) 04/09/2013  Physical Exam  Triage Vital Signs: ED Triage Vitals  Enc Vitals Group     BP 07/07/22 2310 (!) 161/122     Pulse Rate 07/07/22 2310 (!) 51     Resp 07/07/22 2310 12     Temp 07/07/22 2310 97.8 F (36.6 C)     Temp Source 07/07/22 2310 Oral     SpO2 07/07/22 2310 98 %     Weight 07/07/22 2312 190 lb (86.2 kg)     Height 07/07/22 2312 5' 7"  (1.702 m)     Head Circumference --      Peak Flow --      Pain Score 07/07/22 2312 9     Pain Loc --      Pain Edu? --      Excl. in [place]? --     Most recent vital signs: Vitals:   07/07/22 2310  BP: (!) 161/122  Pulse: (!) 51  Resp: 12  Temp: 97.8 F (36.6 C)   SpO2: 98%     General: Awake, no distress.  CV:  Good peripheral perfusion. *** Resp:  Normal effort. *** Abd:  No distention. *** Neuro:             Awake, Alert, Oriented x 3 *** Other:  ***   ED Results / Procedures / Treatments  Labs (all labs ordered are listed, but only abnormal results are displayed) Labs Reviewed  BASIC METABOLIC PANEL  CBC  TROPONIN I (HIGH SENSITIVITY)     EKG  EKG interpretation performed by myself: afib, nml axis, nml intervals, no acute ischemic changes    RADIOLOGY ***   PROCEDURES:  Critical Care performed: {CriticalCareYesNo:19197::"Yes, see critical care procedure note(s)","No"}  Procedures {If the patient is on the monitor, remove the brackets and asterisks. Otherwise delete the sentence below:1} The patient is on the cardiac monitor to evaluate for evidence of arrhythmia and/or significant heart rate changes.   MEDICATIONS ORDERED IN ED: Medications - No data to display   IMPRESSION / MDM / [place] / ED COURSE  I reviewed the triage vital signs and the nursing notes.                              Patient's presentation is most consistent with {EM COPA:27473}  Differential diagnosis includes, but is not limited to, ***       FINAL CLINICAL IMPRESSION(S) / ED DIAGNOSES   Final diagnoses:  None     Rx / DC Orders   ED Discharge Orders     None        Note:  This document was prepared using Dragon voice recognition software and may include unintentional dictation errors.

## 2022-07-08 DIAGNOSIS — J449 Chronic obstructive pulmonary disease, unspecified: Secondary | ICD-10-CM | POA: Diagnosis not present

## 2022-07-08 DIAGNOSIS — R072 Precordial pain: Secondary | ICD-10-CM

## 2022-07-08 DIAGNOSIS — R0789 Other chest pain: Secondary | ICD-10-CM | POA: Diagnosis not present

## 2022-07-08 DIAGNOSIS — I209 Angina pectoris, unspecified: Secondary | ICD-10-CM | POA: Diagnosis not present

## 2022-07-08 DIAGNOSIS — Z951 Presence of aortocoronary bypass graft: Secondary | ICD-10-CM

## 2022-07-08 DIAGNOSIS — I5032 Chronic diastolic (congestive) heart failure: Secondary | ICD-10-CM | POA: Diagnosis not present

## 2022-07-08 DIAGNOSIS — I4891 Unspecified atrial fibrillation: Secondary | ICD-10-CM | POA: Diagnosis not present

## 2022-07-08 LAB — TROPONIN I (HIGH SENSITIVITY)
Troponin I (High Sensitivity): 16 ng/L (ref <18)
Troponin I (High Sensitivity): 16 ng/L (ref <18)

## 2022-07-08 LAB — BASIC METABOLIC PANEL
Anion gap: 10 (ref 5–15)
BUN: 13 mg/dL (ref 8–23)
CO2: 26 mmol/L (ref 22–32)
Calcium: 9.8 mg/dL (ref 8.9–10.3)
Chloride: 106 mmol/L (ref 98–111)
Creatinine, Ser: 0.67 mg/dL (ref 0.61–1.24)
GFR, Estimated: >60 mL/min (ref >60)
Glucose, Bld: 172 mg/dL — ABNORMAL HIGH (ref 70–99)
Potassium: 3.3 mmol/L — ABNORMAL LOW (ref 3.5–5.1)
Sodium: 142 mmol/L (ref 135–145)

## 2022-07-08 LAB — SARS CORONAVIRUS 2 BY RT PCR: SARS Coronavirus 2 by RT PCR: NEGATIVE

## 2022-07-08 LAB — BRAIN NATRIURETIC PEPTIDE: B Natriuretic Peptide: 320.5 pg/mL — ABNORMAL HIGH (ref 0.0–100.0)

## 2022-07-08 LAB — CBG MONITORING, ED: Glucose-Capillary: 230 mg/dL — ABNORMAL HIGH (ref 70–99)

## 2022-07-08 LAB — GLUCOSE, CAPILLARY: Glucose-Capillary: 224 mg/dL — ABNORMAL HIGH (ref 70–99)

## 2022-07-08 MED ORDER — INSULIN ASPART 100 UNIT/ML IJ SOLN: 0.0000 [IU] | Freq: Every day | INTRAMUSCULAR | Status: DC

## 2022-07-08 MED ORDER — POTASSIUM CHLORIDE CRYS ER 20 MEQ PO TBCR
40.0000 meq | EXTENDED_RELEASE_TABLET | Freq: Two times a day (BID) | ORAL | Status: DC
  Administered 2022-07-08: 40 meq via ORAL
  Filled 2022-07-08: qty 2

## 2022-07-08 MED ORDER — ASPIRIN 81 MG PO CHEW
81.0000 mg | CHEWABLE_TABLET | Freq: Every day | ORAL | Status: DC
  Administered 2022-07-08: 81 mg via ORAL
  Filled 2022-07-08: qty 1

## 2022-07-08 MED ORDER — MORPHINE SULFATE (PF) 4 MG/ML IV SOLN
4.0000 mg | Freq: Once | INTRAVENOUS | Status: AC
  Administered 2022-07-08: 4 mg via INTRAVENOUS
  Filled 2022-07-08: qty 1

## 2022-07-08 MED ORDER — NITROGLYCERIN 0.4 MG SL SUBL
0.4000 mg | SUBLINGUAL_TABLET | SUBLINGUAL | Status: DC | PRN
Start: 2022-07-08 — End: 2022-07-08
  Administered 2022-07-08: 0.4 mg via SUBLINGUAL
  Filled 2022-07-08: qty 1

## 2022-07-08 MED ORDER — IPRATROPIUM-ALBUTEROL 0.5-2.5 (3) MG/3ML IN SOLN
3.0000 mL | Freq: Once | RESPIRATORY_TRACT | Status: AC
  Administered 2022-07-08: 3 mL via RESPIRATORY_TRACT
  Filled 2022-07-08: qty 3

## 2022-07-08 MED ORDER — LISINOPRIL 10 MG PO TABS
10.0000 mg | ORAL_TABLET | Freq: Every day | ORAL | Status: DC
  Administered 2022-07-08: 10 mg via ORAL
  Filled 2022-07-08: qty 1

## 2022-07-08 MED ORDER — DILTIAZEM HCL ER COATED BEADS 180 MG PO CP24: 180.0000 mg | ORAL_CAPSULE | Freq: Every day | ORAL | 0 refills | Status: DC

## 2022-07-08 MED ORDER — RANOLAZINE ER 500 MG PO TB12
1000.0000 mg | ORAL_TABLET | Freq: Two times a day (BID) | ORAL | Status: DC
  Administered 2022-07-08: 1000 mg via ORAL
  Filled 2022-07-08: qty 2

## 2022-07-08 MED ORDER — ACETAMINOPHEN 650 MG RE SUPP: 650.0000 mg | Freq: Four times a day (QID) | RECTAL | Status: DC | PRN

## 2022-07-08 MED ORDER — DILTIAZEM HCL ER COATED BEADS 180 MG PO CP24
180.0000 mg | ORAL_CAPSULE | Freq: Every day | ORAL | Status: DC
Start: 2022-07-08 — End: 2022-07-08
  Administered 2022-07-08: 180 mg via ORAL
  Filled 2022-07-08 (×2): qty 1

## 2022-07-08 MED ORDER — FUROSEMIDE 10 MG/ML IJ SOLN
20.0000 mg | Freq: Two times a day (BID) | INTRAMUSCULAR | Status: DC
  Administered 2022-07-08: 20 mg via INTRAVENOUS
  Filled 2022-07-08: qty 4

## 2022-07-08 MED ORDER — DILTIAZEM HCL 25 MG/5ML IV SOLN
20.0000 mg | Freq: Once | INTRAVENOUS | Status: AC
  Administered 2022-07-08: 20 mg via INTRAVENOUS
  Filled 2022-07-08: qty 5

## 2022-07-08 MED ORDER — ONDANSETRON HCL 4 MG PO TABS: 4.0000 mg | ORAL_TABLET | Freq: Four times a day (QID) | ORAL | Status: DC | PRN

## 2022-07-08 MED ORDER — ACETAMINOPHEN 325 MG RE SUPP: 650.0000 mg | Freq: Four times a day (QID) | RECTAL | Status: DC | PRN

## 2022-07-08 MED ORDER — ISOSORBIDE MONONITRATE ER 60 MG PO TB24: 120.0000 mg | ORAL_TABLET | Freq: Every day | ORAL | Status: DC

## 2022-07-08 MED ORDER — APIXABAN 5 MG PO TABS
5.0000 mg | ORAL_TABLET | Freq: Two times a day (BID) | ORAL | Status: DC
  Administered 2022-07-08: 5 mg via ORAL
  Filled 2022-07-08: qty 1

## 2022-07-08 MED ORDER — ALPRAZOLAM 0.5 MG PO TABS
1.0000 mg | ORAL_TABLET | Freq: Three times a day (TID) | ORAL | Status: DC | PRN
  Administered 2022-07-08: 1 mg via ORAL
  Filled 2022-07-08: qty 2

## 2022-07-08 MED ORDER — PREDNISONE 20 MG PO TABS
60.0000 mg | ORAL_TABLET | Freq: Once | ORAL | Status: AC
  Administered 2022-07-08: 60 mg via ORAL
  Filled 2022-07-08: qty 3

## 2022-07-08 MED ORDER — ISOSORBIDE MONONITRATE ER 30 MG PO TB24
15.0000 mg | ORAL_TABLET | Freq: Every day | ORAL | Status: DC
  Filled 2022-07-08: qty 1

## 2022-07-08 MED ORDER — OXYCODONE HCL 5 MG PO TABS
10.0000 mg | ORAL_TABLET | ORAL | Status: DC | PRN
  Administered 2022-07-08 (×2): 10 mg via ORAL
  Filled 2022-07-08 (×2): qty 2

## 2022-07-08 MED ORDER — ACETAMINOPHEN 325 MG PO TABS: 650.0000 mg | ORAL_TABLET | Freq: Four times a day (QID) | ORAL | Status: DC | PRN

## 2022-07-08 MED ORDER — ALBUTEROL SULFATE (2.5 MG/3ML) 0.083% IN NEBU: 2.5000 mg | INHALATION_SOLUTION | RESPIRATORY_TRACT | Status: DC | PRN

## 2022-07-08 MED ORDER — ACETAMINOPHEN 325 MG PO TABS
650.0000 mg | ORAL_TABLET | Freq: Four times a day (QID) | ORAL | Status: DC | PRN
Start: 2022-07-08 — End: 2022-07-08

## 2022-07-08 MED ORDER — OXYCODONE-ACETAMINOPHEN 10-325 MG PO TABS: 1.0000 | ORAL_TABLET | ORAL | Status: DC | PRN

## 2022-07-08 MED ORDER — ACETAMINOPHEN 325 MG PO TABS: 325.0000 mg | ORAL_TABLET | ORAL | Status: DC | PRN

## 2022-07-08 MED ORDER — METOPROLOL TARTRATE 25 MG PO TABS
25.0000 mg | ORAL_TABLET | Freq: Once | ORAL | Status: AC
  Administered 2022-07-08: 25 mg via ORAL
  Filled 2022-07-08: qty 1

## 2022-07-08 MED ORDER — INSULIN ASPART 100 UNIT/ML IJ SOLN
0.0000 [IU] | Freq: Three times a day (TID) | INTRAMUSCULAR | Status: DC
  Administered 2022-07-08 (×2): 5 [IU] via SUBCUTANEOUS
  Filled 2022-07-08 (×2): qty 1

## 2022-07-08 MED ORDER — AMIODARONE HCL 200 MG PO TABS: 200.0000 mg | ORAL_TABLET | Freq: Two times a day (BID) | ORAL | Status: DC

## 2022-07-08 MED ORDER — NITROGLYCERIN 0.4 MG SL SUBL: 0.4000 mg | SUBLINGUAL_TABLET | SUBLINGUAL | Status: DC | PRN

## 2022-07-08 MED ORDER — PREDNISONE 20 MG PO TABS: 40.0000 mg | ORAL_TABLET | Freq: Every day | ORAL | 0 refills | Status: AC

## 2022-07-08 MED ORDER — MORPHINE SULFATE (PF) 4 MG/ML IV SOLN: 4.0000 mg | Freq: Once | INTRAVENOUS | Status: DC

## 2022-07-08 MED ORDER — IPRATROPIUM-ALBUTEROL 0.5-2.5 (3) MG/3ML IN SOLN
3.0000 mL | Freq: Four times a day (QID) | RESPIRATORY_TRACT | Status: DC
  Administered 2022-07-08: 3 mL via RESPIRATORY_TRACT

## 2022-07-08 MED ORDER — PREDNISONE 20 MG PO TABS
40.0000 mg | ORAL_TABLET | Freq: Every day | ORAL | Status: DC
  Administered 2022-07-08: 40 mg via ORAL
  Filled 2022-07-08: qty 2

## 2022-07-08 MED ORDER — AMIODARONE HCL 200 MG PO TABS
400.0000 mg | ORAL_TABLET | Freq: Two times a day (BID) | ORAL | Status: DC
  Administered 2022-07-08: 400 mg via ORAL
  Filled 2022-07-08: qty 2

## 2022-07-08 MED ORDER — ATORVASTATIN CALCIUM 80 MG PO TABS: 80.0000 mg | ORAL_TABLET | Freq: Every day | ORAL | Status: DC

## 2022-07-08 MED ORDER — TORSEMIDE 100 MG PO TABS
50.0000 mg | ORAL_TABLET | Freq: Every day | ORAL | Status: DC
Start: 2022-07-09 — End: 2022-07-08

## 2022-07-08 MED ORDER — ISOSORBIDE MONONITRATE ER 30 MG PO TB24
15.0000 mg | ORAL_TABLET | Freq: Every day | ORAL | Status: DC
  Administered 2022-07-08: 15 mg via ORAL
  Filled 2022-07-08: qty 1

## 2022-07-08 MED ORDER — ONDANSETRON HCL 4 MG/2ML IJ SOLN: 4.0000 mg | Freq: Four times a day (QID) | INTRAMUSCULAR | Status: DC | PRN

## 2022-07-08 NOTE — ED Notes (Signed)
Informed RN bed assigned 

## 2022-07-08 NOTE — Assessment & Plan Note (Signed)
>>  ASSESSMENT AND PLAN FOR ATRIAL FIBRILLATION WITH RVR WRITTEN ON 07/08/2022  4:35 AM BY Andris Baumann, MD  Patient with persistent A-fib.  Had RVR in the ED which improved with diltiazem bolus Continue home amiodarone and Eliquis Updated med rec: Patient has not been taking metoprolol Etiology consult.  Patient recently had cardioversion attempts

## 2022-07-08 NOTE — Hospital Course (Signed)
Taken from H&P.  Lawrence [name] is a 66 y.o. male with medical history significant for COPD on home O2 at 2 L, CAD s/p CABG and stent placement, A-fib on Eliquis and amiodarone s/p unsuccessful cardioversion x2 8/11, S/CHF EF 45 to 50%, DM, HTN, recently hospitalized from 8/13 to 8/15 for bronchitis and rapid A-fib when he had a unsuccessful cardioversion, during which time he had chest pain with flat troponins that was considered atypical by cardiology who presents to the ED with recurrent chest pain, worse than prior episodes of chest pain that was deemed atypical, worse with exertion and radiating to the left shoulder.  He has associated palpitations and is also wheezing.  He denies cough, fever or chills. ED course and data review: BP 161/122 with pulse 51 and otherwise normal vitals.  BP improved while in the ED to 154/97.  Went into rapid A-fib while in the ED up to the 140s improved with Dilt bolus x1. Labs troponin 16-16 and BNP 320.  Potassium 3.3.  Initial EKG showed A-fib at 51.  Chest x-ray showed mild interstitial thickening, may be pulmonary edema with atypical infection. In addition to diltiazem bolus for rapid A-fib patient was given a dose of his home metoprolol as well as sublingual nitroglycerin.  He was also treated with DuoNebs x3 and oral prednisolone for possible COPD exacerbation.   Patient continued to have mild chest discomfort which seems atypical.  Cardiology was consulted.  Troponin remains negative. They make some changes to his A-fib medications, he will take Cardizem 180 mg and amiodarone 200 mg and need to have a close follow-up with his cardiologist for further recommendations.  Patient was safe for discharge.  It was thought to be due to persistent angina based on his extensive cardiac history.  Cardiology was recommending continuation of current medication and follow-up closely with his cardiologist as an outpatient.

## 2022-07-08 NOTE — Assessment & Plan Note (Signed)
Sliding scale insulin coverage 

## 2022-07-08 NOTE — Assessment & Plan Note (Signed)
>>  ASSESSMENT AND PLAN FOR ATRIAL FIBRILLATION WRITTEN ON 03/01/2023 12:37 PM BY Malva Limes, MD  >>ASSESSMENT AND PLAN FOR ATRIAL FIBRILLATION WITH RVR WRITTEN ON 07/08/2022  4:35 AM BY Andris Baumann, MD  Patient with persistent A-fib.  Had RVR in the ED which improved with diltiazem bolus Continue home amiodarone and Eliquis Updated med rec: Patient has not been taking metoprolol Etiology consult.  Patient recently had cardioversion attempts

## 2022-07-08 NOTE — Discharge Summary (Signed)
Physician Discharge Summary   Patient: Lawrence [name] MRN: [id number] DOB: [birth year]  Admit date:     07/07/2022  Discharge date: 07/08/22  Discharge Physician: Lorella Nimrod   PCP: Birdie Sons, MD   Recommendations at discharge:  Follow-up with cardiology within a week.  Discharge Diagnoses: Principal Problem:   Chest pain Active Problems:   COPD exacerbation (HCC)   Anginal pain, suspect stable angina (HCC)   Acute on chronic systolic CHF (congestive heart failure) (HCC)   Atrial fibrillation with RVR (HCC)   CAD (coronary artery disease)   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HCC)   Back pain with left-sided radiculopathy   Chronic respiratory failure with hypoxia (HCC)   Hx of CABG  Resolved Problems:   * No resolved hospital problems. [facility] Course: Taken from H&P.  Lawrence [name] is a 67 y.o. male with medical history significant for COPD on home O2 at 2 L, CAD s/p CABG and stent placement, A-fib on Eliquis and amiodarone s/p unsuccessful cardioversion x2 8/11, S/CHF EF 45 to 50%, DM, HTN, recently hospitalized from 8/13 to 8/15 for bronchitis and rapid A-fib when he had a unsuccessful cardioversion, during which time he had chest pain with flat troponins that was considered atypical by cardiology who presents to the ED with recurrent chest pain, worse than prior episodes of chest pain that was deemed atypical, worse with exertion and radiating to the left shoulder.  He has associated palpitations and is also wheezing.  He denies cough, fever or chills. ED course and data review: BP 161/122 with pulse 51 and otherwise normal vitals.  BP improved while in the ED to 154/97.  Went into rapid A-fib while in the ED up to the 140s improved with Dilt bolus x1. Labs troponin 16-16 and BNP 320.  Potassium 3.3.  Initial EKG showed A-fib at 51.  Chest x-ray showed mild interstitial thickening, may be pulmonary edema with atypical infection. In addition to diltiazem  bolus for rapid A-fib patient was given a dose of his home metoprolol as well as sublingual nitroglycerin.  He was also treated with DuoNebs x3 and oral prednisolone for possible COPD exacerbation.   Patient continued to have mild chest discomfort which seems atypical.  Cardiology was consulted.  Troponin remains negative. They make some changes to his A-fib medications, he will take Cardizem 180 mg and amiodarone 200 mg and need to have a close follow-up with his cardiologist for further recommendations.  Patient was safe for discharge.  It was thought to be due to persistent angina based on his extensive cardiac history.  Cardiology was recommending continuation of current medication and follow-up closely with his cardiologist as an outpatient.  Assessment and Plan: Acute on chronic systolic CHF (congestive heart failure) (HCC) Shortness of breath with elevated BNP in the 300s and chest x-ray showing possible pulmonary edema IV Lasix Continue home lisinopril and metoprolol Daily weights with intake and output monitoring Last EF at [facility] 06/15/2022 with EF 50%  Anginal pain, suspect stable angina ([place]) History of CABG and PCI Troponin negative x2 and EKG nonacute Continue apixaban, aspirin, atorvastatin, Ranexa and isosorbide Cardiology consult  COPD exacerbation ([place]) Chronic respiratory failure with hypoxia Schedule and as needed nebulized bronchodilator treatment Continue home steroids Continue supplemental oxygen  Atrial fibrillation with RVR ([place]) Patient with persistent A-fib.  Had RVR in the ED which improved with diltiazem bolus Continue home amiodarone and Eliquis Updated med rec: Patient has not been taking metoprolol Etiology  consult.  Patient recently had cardioversion attempts  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HCC) Sliding scale insulin coverage  Back pain with left-sided radiculopathy Chronic and stable. Continue percocet  prn   Consultants: Cardiology Procedures performed: None Disposition: Home Diet recommendation:  Discharge Diet Orders (From admission, onward)     Start     Ordered   07/08/22 0000  Diet - low sodium heart healthy        07/08/22 1410           Cardiac and Carb modified diet DISCHARGE MEDICATION: Allergies as of 07/08/2022       Reactions   Demerol  [meperidine Hcl]    Demerol [meperidine] Hives   Jardiance [empagliflozin] Other (See Comments)   Perineal pain   Prednisone Other (See Comments), Hypertension   Pt states that this medication puts him in A-fib    Sulfa Antibiotics Hives   Albuterol Sulfate [albuterol] Palpitations, Other (See Comments)   Pt currently uses this medication.     Morphine Sulfate Nausea And Vomiting, Rash, Other (See Comments)   Pt states that he is only allergic to the tablet form of this medication.          Medication List     STOP taking these medications    amLODipine 2.5 MG tablet Commonly known as: NORVASC   cyclobenzaprine 5 MG tablet Commonly known as: FLEXERIL   metoprolol tartrate 25 MG tablet Commonly known as: LOPRESSOR       TAKE these medications    Accu-Chek Guide Control Liqd Use with blood glucose monitor as directed   Accu-Chek Guide test strip Generic drug: glucose blood Use as instructed to check sugar daily for type 2 diabetes.   Accu-Chek Guide w/Device Kit Use to check blood sugars as directed   Accu-Chek Softclix Lancets lancets Use as instructed to check sugar daily for type 2 diabetes.   albuterol 108 (90 Base) MCG/ACT inhaler Commonly known as: VENTOLIN HFA INHALE 2 PUFFS BY MOUTH EVERY 6 HOURS AS NEEDED FOR SHORTNESS OF BREATH   allopurinol 300 MG tablet Commonly known as: ZYLOPRIM TAKE 1 TABLET BY MOUTH TWICE A DAY   ALPRAZolam 1 MG tablet Commonly known as: XANAX Take 1 tablet (1 mg total) by mouth 3 (three) times daily.   amiodarone 200 MG tablet Commonly known as:  PACERONE Take 200 mg by mouth 2 (two) times daily.   aspirin 81 MG chewable tablet Chew 81 mg by mouth daily. Swallows whole   atorvastatin 80 MG tablet Commonly known as: LIPITOR TAKE 1 TABLET BY MOUTH AT BEDTIME   Belsomra 5 MG Tabs Generic drug: Suvorexant TAKE 1 TABLET (5 MG TOTAL) BY MOUTH AT BEDTIME AS NEEDED   Breo Ellipta 100-25 MCG/ACT Aepb Generic drug: fluticasone furoate-vilanterol INHALE 1 PUFF BY MOUTH INTO THE LUNGS DAILY.   celecoxib 200 MG capsule Commonly known as: CELEBREX Take 1 capsule by mouth 2 (two) times daily.   cetirizine 10 MG tablet Commonly known as: ZYRTEC TAKE 1 TABLET BY MOUTH AT BEDTIME   diltiazem 180 MG 24 hr capsule Commonly known as: CARDIZEM CD Take 1 capsule (180 mg total) by mouth daily. Start taking on: July 09, 2022   docusate sodium 100 MG capsule Commonly known as: COLACE Take 100 mg by mouth daily as needed for mild constipation.   Eliquis 5 MG Tabs tablet Generic drug: apixaban TAKE 1 TABLET BY MOUTH TWICE A DAY   Farxiga 10 MG Tabs tablet Generic drug: dapagliflozin propanediol TAKE  1 TABLET BY MOUTH DAILY   isosorbide mononitrate 120 MG 24 hr tablet Commonly known as: IMDUR TAKE 1 TABLET BY MOUTH DAILY   linaclotide 72 MCG capsule Commonly known as: LINZESS Take 72 mcg by mouth daily before breakfast.   lisinopril 10 MG tablet Commonly known as: ZESTRIL TAKE 1 TABLET BY MOUTH DAILY   magnesium oxide 400 MG tablet Commonly known as: MAG-OX Take 1 tablet by mouth daily.   metFORMIN 500 MG 24 hr tablet Commonly known as: GLUCOPHAGE-XR TAKE 1 TABLET BY MOUTH TWICE A DAY   methocarbamol 500 MG tablet Commonly known as: ROBAXIN TAKE 1 TABLET BY MOUTH EVERY 8 HOURS AS NEEDED FOR MUSCLE SPASMS   naloxone 4 MG/0.1ML Liqd nasal spray kit Commonly known as: NARCAN 1 spray into nostril x1 and may repeat every 2-3 minutes until patient is responsive or EMS arrives   nitroGLYCERIN 0.4 MG SL tablet Commonly  known as: NITROSTAT PLACE ONE TABLET UNDER THE TONGUE EVERY 5 MINUTES FOR CHEST PAIN. IF NO RELIEF PAST 3RD TAB GO TO EMERGENCY ROOM   omega-3 acid ethyl esters 1 g capsule Commonly known as: LOVAZA TAKE 4 CAPSULES BY MOUTH DAILY   omeprazole 40 MG capsule Commonly known as: PRILOSEC TAKE 1 CAPSULE BY MOUTH 2 TIMES DAILY 30 MINUTES BEFORE BREAKFAST AND DINNER   ondansetron 4 MG disintegrating tablet Commonly known as: ZOFRAN-ODT Take 1 tablet (4 mg total) by mouth every 8 (eight) hours as needed for nausea or vomiting.   oxyCODONE-acetaminophen 10-325 MG tablet Commonly known as: PERCOCET TAKE 1 TABLET BY MOUTH EVERY 4 HOURS AS NEEDED FOR PAIN   predniSONE 20 MG tablet Commonly known as: DELTASONE Take 2 tablets (40 mg total) by mouth daily with breakfast for 5 days. Start taking on: July 09, 2022   ranolazine 1000 MG SR tablet Commonly known as: RANEXA Take 1 tablet (1,000 mg total) by mouth 2 (two) times daily.   Spiriva HandiHaler 18 MCG inhalation capsule Generic drug: tiotropium PLACE 1 CAPSULE INTO INHALER AND INHALE BY MOUTH ONCE A DAY.   torsemide 100 MG tablet Commonly known as: DEMADEX Take 0.5 tablets (50 mg total) by mouth daily.   traZODone 150 MG tablet Commonly known as: DESYREL TAKE 1 TABLET BY MOUTH AT BEDTIME   venlafaxine XR 75 MG 24 hr capsule Commonly known as: EFFEXOR-XR TAKE 1 CAPSULE BY MOUTH DAILY WITH BREAKFAST        Follow-up Information     Callwood, Karma Greaser D, MD. Go in 1 week(s).   Specialties: Cardiology, Internal Medicine Contact information: [address] [place] [address] [phone]                Discharge Exam: Danley Danker Weights   07/07/22 2312  Weight: 86.2 kg   General.     In no acute distress. Pulmonary.  Lungs clear bilaterally, normal respiratory effort. CV.  Regular rate and rhythm, no JVD, rub or murmur. Abdomen.  Soft, nontender, nondistended, BS positive. CNS.  Alert and oriented .  No  focal neurologic deficit. Extremities.  No edema, no cyanosis, pulses intact and symmetrical. Psychiatry.  Judgment and insight appears normal.   Condition at discharge: stable  The results of significant diagnostics from this hospitalization (including imaging, microbiology, ancillary and laboratory) are listed below for reference.   Imaging Studies: DG Chest Portable 1 View  Result Date: 07/07/2022 CLINICAL DATA:  Chest pain and shortness of breath EXAM: PORTABLE CHEST 1 VIEW COMPARISON:  07/03/2022 FINDINGS: Prior median sternotomy  and CABG. Coronary artery calcifications or stent. Stable heart size and mediastinal contours. There is mild interstitial thickening that is new from prior exam. No pleural fluid or pneumothorax. No confluent consolidation. IMPRESSION: 1. Mild interstitial thickening, may be pulmonary edema or atypical infection. 2. Prior CABG. Electronically Signed   By: Keith Rake M.D.   On: 07/07/2022 23:47   DG Chest 2 View  Result Date: 07/03/2022 CLINICAL DATA:  Chest pain. EXAM: CHEST - 2 VIEW COMPARISON:  06/28/2022 FINDINGS: Sternotomy wires unchanged. Lungs are adequately inflated without focal airspace consolidation or effusion. Cardiomediastinal silhouette and remainder of the exam is unchanged. IMPRESSION: No active cardiopulmonary disease. Electronically Signed   By: Marin Olp M.D.   On: 07/03/2022 12:52   DG Chest Portable 1 View  Result Date: 06/28/2022 CLINICAL DATA:  Chest pain and shortness of breath EXAM: PORTABLE CHEST 1 VIEW COMPARISON:  06/05/2022 FINDINGS: Cardiac shadow is stable. Postsurgical changes are again noted. Mild atelectatic changes are noted in the bases. No sizable effusion or confluent infiltrate is noted no bony abnormality is noted. IMPRESSION: Mild bibasilar atelectatic changes. Electronically Signed   By: Inez Catalina M.D.   On: 06/28/2022 02:20    Microbiology: Results for orders placed or performed during the hospital encounter  of 07/07/22  SARS Coronavirus 2 by RT PCR (hospital order, performed in [facility] hospital lab) *cepheid single result test* Anterior Nasal Swab     Status: None   Collection Time: 07/08/22  4:02 AM   Specimen: Anterior Nasal Swab  Result Value Ref Range Status   SARS Coronavirus 2 by RT PCR NEGATIVE NEGATIVE Final    Comment: (NOTE) SARS-CoV-2 target nucleic acids are NOT DETECTED.  The SARS-CoV-2 RNA is generally detectable in upper and lower respiratory specimens during the acute phase of infection. The lowest concentration of SARS-CoV-2 viral copies this assay can detect is 250 copies / mL. A negative result does not preclude SARS-CoV-2 infection and should not be used as the sole basis for treatment or other patient management decisions.  A negative result may occur with improper specimen collection / handling, submission of specimen other than nasopharyngeal swab, presence of viral mutation(s) within the areas targeted by this assay, and inadequate number of viral copies (<250 copies / mL). A negative result must be combined with clinical observations, patient history, and epidemiological information.  Fact Sheet for Patients:   https://www.patel.info/  Fact Sheet for Healthcare Providers: https://hall.com/  This test is not yet approved or  cleared by the [place] FDA and has been authorized for detection and/or diagnosis of SARS-CoV-2 by FDA under an Emergency Use Authorization (EUA).  This EUA will remain in effect (meaning this test can be used) for the duration of the COVID-19 declaration under Section 564(b)(1) of the Act, 21 U.S.C. section 360bbb-3(b)(1), unless the authorization is terminated or revoked sooner.  Performed at [facility], [address] [place], [place] [address]     Labs: CBC: Recent Labs  Lab 07/03/22 1228 07/07/22 2322  WBC 10.3 8.5  HGB 19.3* 16.7  HCT 60.4* 50.8  MCV 91.7  91.5  PLT 353 983   Basic Metabolic Panel: Recent Labs  Lab 07/03/22 1228 07/07/22 2322  NA 141 142  K 3.1* 3.3*  CL 95* 106  CO2 33* 26  GLUCOSE 204* 172*  BUN 13 13  CREATININE 1.07 0.67  CALCIUM 10.1 9.8   Liver Function Tests: No results for input(s): "AST", "ALT", "ALKPHOS", "BILITOT", "PROT", "ALBUMIN" in  the last 168 hours. CBG: Recent Labs  Lab 07/08/22 0749 07/08/22 1246  GLUCAP 230* 224*    Discharge time spent: greater than 30 minutes.  This record has been created using Systems analyst. Errors have been sought and corrected,but may not always be located. Such creation errors do not reflect on the standard of care.   Signed: Lorella Nimrod, MD [facility] 07/08/2022

## 2022-07-08 NOTE — Consult Note (Signed)
[facility] NOTE       Patient ID: Lawrence [name] MRN: [id number] DOB/AGE: 68-Mar-1957 68 y.o.  Admit date: 07/07/2022 Referring Physician Dr. Reesa Chew  Primary Physician Dr. [name] Primary Cardiologist Dr. Clayborn Bigness Reason for Consultation chest pain   HPI: Lawrence [name] is a 66yoM with a PMH of CAD (h/o STEMI s/p CABG x2 LIMA-LAD and SVG-OM1 2002 c/b coronary artery dissection with multiple subsequent PCI and stents), HFpEF (LVEF 50% with apical septal akinesis, mild TR and MR 06/12/2022), paroxysmal atrial fibrillation s/p unsuccessful DCCV 06/29/2022, chronic respiratory failure on as needed 2 L, type 2 diabetes who presented to [facility] ED 07/07/2022 with shortness of breath and palpitations with associated left-sided chest discomfort.  Cardiology is consulted for further assistance.  The patient presented last night with 9 out of 10 chest pain that occurred while he was sitting at rest watching TV.  He describes the pain as a pressure, that radiates up into his left shoulder similar in character to his previous MI in 2002, that was constant in nature until he received morphine in the ED.  He also noted palpitations and heart racing and felt like he was in A-fib.  Noted that he told the ED provider initially that his pain was sharp, stabbing on the left side that did not radiate and was nonexertional not pleuritic and was fairly constant.  He said he took aspirin at the direction of EMS and the pain lasted until they gave him morphine in the ED, said that sublingual nitro did not help.  He has chronic exertional dyspnea, on his baseline oxygen of 2 L currently, denies dizziness, presyncope, lower extremity edema, orthopnea.  Compliant with his CPAP at home.  Reports he was not taking the metoprolol tartrate, citing "he was told he was not supposed to take that after he left the hospital" although is listed on his discharge summary.  He quit smoking in 2014, previously smoked  3-1/2 packs/day for 25 or 30 years.  Does not drink alcohol and denies illicit substance use.  Vitals are notable for a blood pressure of 114/82, heart rate in the 80s to 90s in atrial fibrillation on telemetry, SPO2 98% on his baseline 2 L by nasal cannula.  Labs are notable for hypokalemia potassium 3.3, BUN/creatinine 13/0.67, GFR greater than 60.  BNP slightly elevated compared to 4 days ago at 320 (was 209), troponins negative at 16-16.  H&H stable at 16/50 platelets 228.  Chest x-ray with mild interstitial thickening.  Review of systems complete and found to be negative unless listed above     Past Medical History:  Diagnosis Date   A-fib ([place])    Anemia    Anginal pain ([place])    Anxiety    Arthritis    Asthma    CAD (coronary artery disease)    a. 2002 CABGx2 (LIMA->LAD, VG->VG->OM1);  b. 09/2012 DES->OM;  c. 03/2015 PTCA of LAD [facility]) in setting of atretic LIMA; d. 05/2015 Cath [facility]): nonobs dzs; e. 06/2015 Cath ([facility]): LM nl, LAD 45p/d ISR, 50d, D1/2 small, LCX 50p/d ISR, OM1 70ost, 30 ISR, VG->OM1 50ost, 71m LIMA->LAD 99p/d - atretic, RCA dom, nl; f.cath 10/16: 40-50%(FFR 0.90) pLAD, 75% (FFR 0.77) mLAD s/p PCI/DES, oRCA 40% (FFR0.95)   Cancer (HCC)    SKIN CANCER ON BACK   Celiac disease    Chronic diastolic CHF (congestive heart failure) ([place]    a. 06/2009 Echo: EF 60-65%, Gr 1 DD, triv AI, mildly dil LA,  nl RV.   COPD (chronic obstructive pulmonary disease) ([place])    a. Chronic bronchitis and emphysema.   DDD (degenerative disc disease), lumbar    Diverticulosis    Dysrhythmia    Essential hypertension    GERD (gastroesophageal reflux disease)    History of hiatal hernia    History of kidney stones    H/O   History of tobacco abuse    a. Quit 2014.   Myocardial infarction [facility]) 2002   4 STENTS   Pancreatitis    PSVT (paroxysmal supraventricular tachycardia) ([place])    a. 10/2012 Noted on [facility] Patch.   Sleep apnea    LOST CORD TO CPAP -ONLY 02 @ BEDTIME   Tubular  adenoma of colon    Type II diabetes mellitus ([place])     Past Surgical History:  Procedure Laterality Date   BYPASS GRAFT     CARDIAC CATHETERIZATION N/A 07/12/2015   rocedure: Left Heart Cath and Cors/Grafts Angiography;  Surgeon: Belva Crome, MD;  Location: [place] CV LAB;  Service: Cardiovascular;  Laterality: N/A;   CARDIAC CATHETERIZATION Right 10/07/2015   Procedure: Left Heart Cath and Cors/Grafts Angiography;  Surgeon: Dionisio David, MD;  Location: [place] CV LAB;  Service: Cardiovascular;  Laterality: Right;   CARDIAC CATHETERIZATION N/A 04/06/2016   Procedure: Left Heart Cath and Coronary Angiography;  Surgeon: Yolonda Kida, MD;  Location: [place] CV LAB;  Service: Cardiovascular;  Laterality: N/A;   CARDIAC CATHETERIZATION  04/06/2016   Procedure: Bypass Graft Angiography;  Surgeon: Yolonda Kida, MD;  Location: [place] CV LAB;  Service: Cardiovascular;;   CARDIAC CATHETERIZATION N/A 11/02/2016   Procedure: Left Heart Cath and Cors/Grafts Angiography and possible PCI;  Surgeon: Yolonda Kida, MD;  Location: [place] CV LAB;  Service: Cardiovascular;  Laterality: N/A;   CARDIAC CATHETERIZATION N/A 11/02/2016   Procedure: Coronary Stent Intervention;  Surgeon: Yolonda Kida, MD;  Location: [place] CV LAB;  Service: Cardiovascular;  Laterality: N/A;   CARDIOVERSION N/A 06/29/2022   Procedure: CARDIOVERSION;  Surgeon: Corey Skains, MD;  Location: [facility] ORS;  Service: Cardiovascular;  Laterality: N/A;   CHOLECYSTECTOMY     CIRCUMCISION N/A 06/09/2019   Procedure: CIRCUMCISION ADULT;  Surgeon: Billey Co, MD;  Location: [facility] ORS;  Service: Urology;  Laterality: N/A;   COLONOSCOPY WITH PROPOFOL N/A 04/01/2018   Procedure: COLONOSCOPY WITH PROPOFOL;  Surgeon: Manya Silvas, MD;  Location: [facility] ENDOSCOPY;  Service: Endoscopy;  Laterality: N/A;   ESOPHAGEAL DILATION     ESOPHAGOGASTRODUODENOSCOPY (EGD) WITH PROPOFOL N/A 04/01/2018    Procedure: ESOPHAGOGASTRODUODENOSCOPY (EGD) WITH PROPOFOL;  Surgeon: Manya Silvas, MD;  Location: [facility] ENDOSCOPY;  Service: Endoscopy;  Laterality: N/A;   LEFT HEART CATH AND CORS/GRAFTS ANGIOGRAPHY N/A 06/12/2019   Procedure: LEFT HEART CATH AND CORS/GRAFTS ANGIOGRAPHY;  Surgeon: Teodoro Spray, MD;  Location: [place] CV LAB;  Service: Cardiovascular;  Laterality: N/A;   LEFT HEART CATH AND CORS/GRAFTS ANGIOGRAPHY N/A 03/11/2020   Procedure: LEFT HEART CATH AND CORS/GRAFTS ANGIOGRAPHY;  Surgeon: Isaias Cowman, MD;  Location: [place] CV LAB;  Service: Cardiovascular;  Laterality: N/A;   LEFT HEART CATH AND CORS/GRAFTS ANGIOGRAPHY N/A 05/01/2021   Procedure: LEFT HEART CATH AND CORS/GRAFTS ANGIOGRAPHY;  Surgeon: Corey Skains, MD;  Location: [place] CV LAB;  Service: Cardiovascular;  Laterality: N/A;   TONSILLECTOMY     VASCULAR SURGERY      (Not in a hospital admission)  Social History  Socioeconomic History   Marital status: Widowed    Spouse name: Not on file   Number of children: 1   Years of education: Not on file   Highest education level: 10th grade  Occupational History   Occupation: Disabled   Occupation: on [employer] farm manager  Tobacco Use   Smoking status: Former    Packs/day: 3.00    Years: 50.00    Total pack years: 150.00    Types: Cigarettes    Quit date: 04/22/2013    Years since quitting: 9.2   Smokeless tobacco: Never   Tobacco comments:    Reports not smoking for approx 8 years.  Vaping Use   Vaping Use: Never used  Substance and Sexual Activity   Alcohol use: No    Comment: remotely quit alcohol use. Hx of heavy alcohol use.   Drug use: Yes    Types: Marijuana    Comment: occasionally   Sexual activity: Not on file  Other Topics Concern   Not on file  Social History Narrative   Pt lives in [place] with wife.  Does not routinely exercise.   Social Determinants of Health   Financial Resource Strain: Low Risk   (01/08/2022)   Overall Financial Resource Strain (CARDIA)    Difficulty of Paying Living Expenses: Not hard at all  Food Insecurity: No Food Insecurity (01/08/2022)   Hunger Vital Sign    Worried About Running Out of Food in the Last Year: Never true    Ran Out of Food in the Last Year: Never true  Transportation Needs: No Transportation Needs (01/08/2022)   PRAPARE - Hydrologist (Medical): No    Lack of Transportation (Non-Medical): No  Physical Activity: Insufficiently Active (01/08/2022)   Exercise Vital Sign    Days of Exercise per Week: 3 days    Minutes of Exercise per Session: 20 min  Stress: No Stress Concern Present (01/08/2022)   [place]    Feeling of Stress : Not at all  Social Connections: Moderately Isolated (01/08/2022)   Social Connection and Isolation Panel [NHANES]    Frequency of Communication with Friends and Family: More than three times a week    Frequency of Social Gatherings with Friends and Family: More than three times a week    Attends Religious Services: More than 4 times per year    Active Member of [employer] or Organizations: No    Attends Archivist Meetings: Never    Marital Status: Widowed  Intimate Partner Violence: Not At Risk (01/08/2022)   Humiliation, Afraid, Rape, and Kick questionnaire    Fear of Current or Ex-Partner: No    Emotionally Abused: No    Physically Abused: No    Sexually Abused: No    Family History  Problem Relation Age of Onset   Heart attack Mother    Depression Mother    Heart disease Mother    COPD Mother    Hypertension Mother    Heart attack Father    Diabetes Father    Depression Father    Heart disease Father    Cirrhosis Father    Parkinson's disease Brother       PHYSICAL EXAM General: Chronically ill-appearing barrel chested Caucasian male, conversational, comfortable appearing.  Sitting at incline in ED  stretcher, lunch at bedside HEENT:  Normocephalic and atraumatic. Neck:  No JVD.  Lungs: Normal respiratory effort on 2 L by nasal cannula.  Decreased breath sounds bilaterally with scattered expiratory wheezes prolonged expiratory phase, trace right basilar crackles.   Heart: Irregular irregular rhythm with controlled rate. Normal S1 and S2 without gallops or murmurs.  Abdomen: Obese appearing.  Msk: Normal strength and tone for age. Extremities: Warm and well perfused. No clubbing, cyanosis.  No peripheral edema.  Neuro: Alert and oriented X 3. Psych:  Answers questions appropriately.   Labs: Basic Metabolic Panel: Recent Labs    07/07/22 2322  NA 142  K 3.3*  CL 106  CO2 26  GLUCOSE 172*  BUN 13  CREATININE 0.67  CALCIUM 9.8   Liver Function Tests: No results for input(s): "AST", "ALT", "ALKPHOS", "BILITOT", "PROT", "ALBUMIN" in the last 72 hours. No results for input(s): "LIPASE", "AMYLASE" in the last 72 hours. CBC: Recent Labs    07/07/22 2322  WBC 8.5  HGB 16.7  HCT 50.8  MCV 91.5  PLT 228   Cardiac Enzymes: Recent Labs    07/07/22 2322 07/08/22 0113  TROPONINIHS 16 16   BNP: Invalid input(s): "POCBNP" D-Dimer: No results for input(s): "DDIMER" in the last 72 hours. Hemoglobin A1C: No results for input(s): "HGBA1C" in the last 72 hours. Fasting Lipid Panel: No results for input(s): "CHOL", "HDL", "LDLCALC", "TRIG", "CHOLHDL", "LDLDIRECT" in the last 72 hours. Thyroid Function Tests: No results for input(s): "TSH", "T4TOTAL", "T3FREE", "THYROIDAB" in the last 72 hours.  Invalid input(s): "FREET3" Anemia Panel: No results for input(s): "VITAMINB12", "FOLATE", "FERRITIN", "TIBC", "IRON", "RETICCTPCT" in the last 72 hours.  DG Chest Portable 1 View  Result Date: 07/07/2022 CLINICAL DATA:  Chest pain and shortness of breath EXAM: PORTABLE CHEST 1 VIEW COMPARISON:  07/03/2022 FINDINGS: Prior median sternotomy and CABG. Coronary artery calcifications or  stent. Stable heart size and mediastinal contours. There is mild interstitial thickening that is new from prior exam. No pleural fluid or pneumothorax. No confluent consolidation. IMPRESSION: 1. Mild interstitial thickening, may be pulmonary edema or atypical infection. 2. Prior CABG. Electronically Signed   By: Keith Rake M.D.   On: 07/07/2022 23:47     Radiology: DG Chest Portable 1 View  Result Date: 07/07/2022 CLINICAL DATA:  Chest pain and shortness of breath EXAM: PORTABLE CHEST 1 VIEW COMPARISON:  07/03/2022 FINDINGS: Prior median sternotomy and CABG. Coronary artery calcifications or stent. Stable heart size and mediastinal contours. There is mild interstitial thickening that is new from prior exam. No pleural fluid or pneumothorax. No confluent consolidation. IMPRESSION: 1. Mild interstitial thickening, may be pulmonary edema or atypical infection. 2. Prior CABG. Electronically Signed   By: Keith Rake M.D.   On: 07/07/2022 23:47   DG Chest 2 View  Result Date: 07/03/2022 CLINICAL DATA:  Chest pain. EXAM: CHEST - 2 VIEW COMPARISON:  06/28/2022 FINDINGS: Sternotomy wires unchanged. Lungs are adequately inflated without focal airspace consolidation or effusion. Cardiomediastinal silhouette and remainder of the exam is unchanged. IMPRESSION: No active cardiopulmonary disease. Electronically Signed   By: Marin Olp M.D.   On: 07/03/2022 12:52   DG Chest Portable 1 View  Result Date: 06/28/2022 CLINICAL DATA:  Chest pain and shortness of breath EXAM: PORTABLE CHEST 1 VIEW COMPARISON:  06/05/2022 FINDINGS: Cardiac shadow is stable. Postsurgical changes are again noted. Mild atelectatic changes are noted in the bases. No sizable effusion or confluent infiltrate is noted no bony abnormality is noted. IMPRESSION: Mild bibasilar atelectatic changes. Electronically Signed   By: Inez Catalina M.D.   On: 06/28/2022 02:20    [place] 03/11/2020 Cardiac Catheterization: (03/11/2020)  Narrative  1st  Mrg lesion is 70% stenosed.  Previously placed Prox Cx to Mid Cx stent (unknown type) is widely patent.  Prox LAD to Mid LAD lesion is 30% stenosed.  Mid LAD lesion is 40% stenosed.  Previously placed Mid LAD to Dist LAD stent (unknown type) is widely patent.  Prox RCA lesion is 15% stenosed.  Dist RCA lesion is 25% stenosed.  RPAV lesion is 60% stenosed.  Previously placed Origin to Prox Graft stent (unknown type) is widely patent.  LIMA graft was not injected and is small.  1. Two-vessel coronary artery disease with patent stents proximal mid and distal LAD, proximal left circumflex, 70% stenosis OM1 2. Atretic LIMA to LAD, patent SVG to OM1 with patent stent ostium SVG 3. Mildly reduced left ventricular function with estimated LVEF 40 to 45% 4. No significant changes observed compared to prior cardiac catheterization 06/12/2019  ECHO 06/12/2022               HISTORY: Chest pain, Coronary Artery Disease                 REASON: Assess, LV function             INDICATION: I25.118 Coronary artery disease of native artery of native heart,                         F12.19 Chronic diastolic (congestive) heart failure  _________________________________________________________________________________________  ECHOCARDIOGRAPHIC MEASUREMENTS  2D DIMENSIONS  AORTA                  Values   Normal Range   MAIN PA         Values    Normal Range                Annulus: nm*          [2.3-2.9]         PA Main: nm*       [1.5-2.1]              Aorta Sin: 3.5 cm       [3.1-3.7]    RIGHT VENTRICLE            ST Junction: 2.7 cm       [2.6-3.2]         RV Base: 4.3 cm    [< 4.2]              Asc.Aorta: 3.5 cm       [2.6-3.4]          RV Mid: 3.7 cm    [< 3.5]  LEFT VENTRICLE                                      RV Length: nm*       [<8.6]                  LVIDd: 4.6 cm       [4.2-5.9]    INFERIOR VENA CAVA                  LVIDs: 3.6 cm                        Max. IVC: nm*       [<=2.1]  FS: 22.4 %       [>25]            Min. IVC: nm*                    SWT: 1.3 cm       [0.6-1.0]    ------------------                    PWT: 1.3 cm       [0.6-1.0]    nm* - not measured  LEFT ATRIUM                LA Diam: 4.7 cm       [3.0-4.0]            LA A4C Area: nm*          [<20]              LA Volume: nm*          [18-58]  _________________________________________________________________________________________  ECHOCARDIOGRAPHIC DESCRIPTIONS  AORTIC ROOT                   Size: Normal             Dissection: INDETERM FOR DISSECTION  AORTIC VALVE               Leaflets: Tricuspid                   Morphology: Normal               Mobility: Fully mobile  LEFT VENTRICLE                   Size: Normal                        Anterior: Normal            Contraction: REGIONALLY IMPAIRED            Lateral: Normal             Closest EF: 50% (Estimated)                 Septal: AKINETIC              LV Masses: No Masses                       Apical: AKINETIC                    LVH: None                          Inferior: Normal                                                      Posterior: Normal           Dias.FxClass: (Grade 1) relaxation abnormal, E/A reversal  MITRAL VALVE               Leaflets: Normal                        Mobility: Fully mobile  Morphology: Normal  LEFT ATRIUM                   Size: MILDLY ENLARGED              LA Masses: No masses              IA Septum: Normal IAS  MAIN PA                   Size: Normal  PULMONIC VALVE             Morphology: Normal                        Mobility: Fully mobile  RIGHT VENTRICLE                   Size: MILDLY ENLARGED              Free Wall: Normal            Contraction: Normal                       RV Masses: No Masses  TRICUSPID VALVE               Leaflets: Normal                        Mobility: Fully mobile             Morphology: Normal  RIGHT ATRIUM                   Size: MILDLY  ENLARGED               RA Other: None                RA Mass: No masses  PERICARDIUM                  Fluid: No effusion  INFERIOR VENACAVA                   Size: Normal Normal respiratory collapse  _________________________________________________________________________________________   DOPPLER ECHO and OTHER SPECIAL PROCEDURES                 Aortic: TRIVIAL AR                 No AS                         129.0 cm/sec peak vel      6.7 mmHg peak grad                         3.4 mmHg mean grad         3.4 cm^2 by DOPPLER                 Mitral: MILD MR                    No MS                         MV Inflow E Vel = 88.3 cm/sec      MV Annulus E'Vel = nm*  E/E'Ratio = nm*              Tricuspid: MILD TR                    No TS                         281.2 cm/sec peak TR vel   34.6 mmHg peak RV pressure              Pulmonary: TRIVIAL PR                 No PS  _________________________________________________________________________________________  INTERPRETATION  REGIONALLY-IMPAIRED LEFT VENTRICLE WITH PRESERVED SYSTOLIC FUNCTION; ESTIMATED EF = 50 %  NORMAL RIGHT VENTRICULAR SYSTOLIC FUNCTION  MILD TRICUSPID AND MITRAL VALVE INSUFFICIENCY  TRACE AORTIC VALVE INSUFFICIENCY  NO VALVULAR STENOSIS  MILD RV ENLARGEMENT  MILD BIATRIAL ENLARGEMENT  MILD LVH  APICAL SEPTAL AKINESIS   TELEMETRY reviewed by me (LT) 07/08/2022 : Initially atrial fibrillation with RVR, rates in the 110s to 120s, improvement to rate in the 80s and 90s in atrial fibrillation this morning.  EKG reviewed by me: atrial fibrillation rate 98, inf Q waves overall similar to prior from 8/18   Data reviewed by me (LT) 07/08/2022: ED provider note, hospitalist H&P, EKG, CBC, BMP, BNP, troponin  ASSESSMENT AND PLAN:   Lawrence [name] is a 19yoM with a PMH of CAD (h/o STEMI s/p CABG x2 LIMA-LAD and SVG-OM1 2002 c/b coronary artery dissection with multiple subsequent PCI and stents), HFpEF  (LVEF 50% with apical septal akinesis, mild TR and MR 06/12/2022), paroxysmal atrial fibrillation s/p unsuccessful DCCV 06/29/2022, chronic respiratory failure on as needed 2 L, type 2 diabetes who presented to [facility] ED 07/07/2022 with shortness of breath and palpitations with associated left-sided chest discomfort.  Cardiology is consulted for further assistance.  #CAD s/p CABG x2 (2002), multiple subsequent PCI's w/ stable angina #Paroxysmal atrial fibrillation with RVR The patient presents to the ER for the fourth time over the past month with chest pain and A-fib with RVR.  Was recently admitted for 2 days (discharged on 8/15) and treated for bronchitis and underwent unsuccessful DCCV for his paroxysmal A-fib.  He presents today with cute onset of left-sided chest pressure, which he described to me as his typical anginal pain and associated palpitations, relieved with IV diltiazem and p.o. metoprolol, and IV morphine.  He is overall well-appearing, hemodynamically stable. Heart rate much improved after CCB and p.o. beta-blocker administration.  EKG without acute changes, troponins negative on 2 repeats at 16-16.  Discussed in detail with patient he is likely going to have chronic angina due to his underlying significant CAD. -S/p 325 mg aspirin, continue 81 mg aspirin daily -Continue Eliquis 5 mg twice daily.  CHA2DS2-VASc 4 -We will add Cardizem CD 180 mg for rate control and as an antianginal.  Discontinue metoprolol -Continue home antianginals including Imdur 120 mg once daily, Ranexa 1000 mg twice daily -Continue atorvastatin 80 mg once daily -No further cardiac diagnostics necessary, -He is okay for discharge today from a cardiac standpoint, will need follow-up Dr. Clayborn Bigness in 1 to 2 weeks.  #Chronic HFpEF Recent echo from 06/12/2022 shows slightly improved EF at 50% from 45 to 50% last year.  BNP slightly elevated at 300, although not clinically volume overloaded appearing. -Continue medicines as  above and home torsemide 50 mg once daily -Defer repeat echo with recent study a month ago -Discussed in  detail salt and fluid restriction  #COPD On baseline 2 L currently.  Continue home medicines per primary team.  This patient's plan of care was discussed and created with Dr. Clayborn Bigness and he is in agreement.  Signed: Tristan Schroeder , PA-C 07/08/2022, 11:26 AM [facility] Cardiology

## 2022-07-08 NOTE — Assessment & Plan Note (Signed)
>>  ASSESSMENT AND PLAN FOR PAROXYSMAL ATRIAL FIBRILLATION WRITTEN ON 03/01/2023 12:37 PM BY Malva Limes, MD  >>ASSESSMENT AND PLAN FOR ATRIAL FIBRILLATION WRITTEN ON 03/01/2023 12:37 PM BY Malva Limes, MD  >>ASSESSMENT AND PLAN FOR ATRIAL FIBRILLATION WITH RVR WRITTEN ON 07/08/2022  4:35 AM BY Andris Baumann, MD  Patient with persistent A-fib.  Had RVR in the ED which improved with diltiazem bolus Continue home amiodarone and Eliquis Updated med rec: Patient has not been taking metoprolol Etiology consult.  Patient recently had cardioversion attempts

## 2022-07-08 NOTE — TOC CM/SW Note (Addendum)
Patient has orders to discharge home today. Chart reviewed. PCP is [name], MD. He is on 2 L oxygen chronic ([employer]). No wounds. He is active with [facility] for PT, OT, RN. They are aware of discharge today. No further needs identified. CSW signing off.  Dayton Scrape, CSW [phone]  3:49 pm: Patient's niece can't be here until around 6:00 to take him home. He is unable to afford a cab. He reports he does not need oxygen for 15 minute ride home and is independent enough to get out of the cab and to his door on his own. Left voicemail for [employer] and faxed cab voucher. Told patient to leave niece as backup ride home in case they do not call back.  Dayton Scrape, [place]

## 2022-07-08 NOTE — Assessment & Plan Note (Signed)
Patient with persistent A-fib.  Had RVR in the ED which improved with diltiazem bolus Continue home amiodarone and Eliquis Updated med rec: Patient has not been taking metoprolol Etiology consult.  Patient recently had cardioversion attempts

## 2022-07-08 NOTE — Assessment & Plan Note (Signed)
Chronic and stable. Continue percocet prn

## 2022-07-08 NOTE — Assessment & Plan Note (Signed)
Chronic respiratory failure with hypoxia Schedule and as needed nebulized bronchodilator treatment Continue home steroids Continue supplemental oxygen

## 2022-07-08 NOTE — Assessment & Plan Note (Addendum)
History of CABG and PCI Troponin negative x2 and EKG nonacute Continue apixaban, aspirin, atorvastatin, Ranexa and isosorbide Cardiology consult

## 2022-07-08 NOTE — H&P (Signed)
History and Physical    Patient: Lawrence [name]:[id number] DOB: July 15, 1956 DOA: 07/07/2022 DOS: the patient was seen and examined on 07/08/2022 PCP: Birdie Sons, MD  Patient coming from: Home  Chief Complaint:  Chief Complaint  Patient presents with   Chest Pain    HPI: Lawrence [name] is a 66 y.o. male with medical history significant for COPD on home O2 at 2 L, CAD s/p CABG and stent placement, A-fib on Eliquis and amiodarone s/p unsuccessful cardioversion x2 8/11, S/CHF EF 45 to 50%, DM, HTN, recently hospitalized from 8/13 to 8/15 for bronchitis and rapid A-fib when he had a unsuccessful cardioversion, during which time he had chest pain with flat troponins that was considered atypical by cardiology who presents to the ED with recurrent chest pain, worse than prior episodes of chest pain that was deemed atypical, worse with exertion and radiating to the left shoulder.  He has associated palpitations and is also wheezing.  He denies cough, fever or chills. ED course and data review: BP 161/122 with pulse 51 and otherwise normal vitals.  BP improved while in the ED to 154/97.  Went into rapid A-fib while in the ED up to the 140s improved with Dilt bolus x1. Labs troponin 16-16 and BNP 320.  Potassium 3.3.  Initial EKG showed A-fib at 51.  Chest x-ray showed mild interstitial thickening, may be pulmonary edema with atypical infection. In addition to diltiazem bolus for rapid A-fib patient was given a dose of his home metoprolol as well as sublingual nitroglycerin.  He was also treated with DuoNebs x3 and oral prednisolone for possible COPD exacerbation.  Hospitalist consulted for admission.     Past Medical History:  Diagnosis Date   A-fib ([place])    Anemia    Anginal pain ([place])    Anxiety    Arthritis    Asthma    CAD (coronary artery disease)    a. 2002 CABGx2 (LIMA->LAD, VG->VG->OM1);  b. 09/2012 DES->OM;  c. 03/2015 PTCA of LAD [facility]) in setting of atretic LIMA; d. 05/2015 Cath  [facility]): nonobs dzs; e. 06/2015 Cath ([facility]): LM nl, LAD 45p/d ISR, 50d, D1/2 small, LCX 50p/d ISR, OM1 70ost, 30 ISR, VG->OM1 50ost, 31m LIMA->LAD 99p/d - atretic, RCA dom, nl; f.cath 10/16: 40-50%(FFR 0.90) pLAD, 75% (FFR 0.77) mLAD s/p PCI/DES, oRCA 40% (FFR0.95)   Cancer (HCC)    SKIN CANCER ON BACK   Celiac disease    Chronic diastolic CHF (congestive heart failure) ([place]    a. 06/2009 Echo: EF 60-65%, Gr 1 DD, triv AI, mildly dil LA, nl RV.   COPD (chronic obstructive pulmonary disease) ([place]    a. Chronic bronchitis and emphysema.   DDD (degenerative disc disease), lumbar    Diverticulosis    Dysrhythmia    Essential hypertension    GERD (gastroesophageal reflux disease)    History of hiatal hernia    History of kidney stones    H/O   History of tobacco abuse    a. Quit 2014.   Myocardial infarction ([facility] 2002   4 STENTS   Pancreatitis    PSVT (paroxysmal supraventricular tachycardia) ([place]    a. 10/2012 Noted on Zio Patch.   Sleep apnea    LOST CORD TO CPAP -ONLY 02 @ BEDTIME   Tubular adenoma of colon    Type II diabetes mellitus ([place]    Past Surgical History:  Procedure Laterality Date   BYPASS GRAFT     CARDIAC CATHETERIZATION N/A 07/12/2015  rocedure: Left Heart Cath and Cors/Grafts Angiography;  Surgeon: Belva Crome, MD;  Location: [place] CV LAB;  Service: Cardiovascular;  Laterality: N/A;   CARDIAC CATHETERIZATION Right 10/07/2015   Procedure: Left Heart Cath and Cors/Grafts Angiography;  Surgeon: Dionisio David, MD;  Location: [place] CV LAB;  Service: Cardiovascular;  Laterality: Right;   CARDIAC CATHETERIZATION N/A 04/06/2016   Procedure: Left Heart Cath and Coronary Angiography;  Surgeon: Yolonda Kida, MD;  Location: [place] [facility];  Service: Cardiovascular;  Laterality: N/A;   CARDIAC CATHETERIZATION  04/06/2016   Procedure: Bypass Graft Angiography;  Surgeon: Yolonda Kida, MD;  Location: [place] CV LAB;  Service: Cardiovascular;;    CARDIAC CATHETERIZATION N/A 11/02/2016   Procedure: Left Heart Cath and Cors/Grafts Angiography and possible PCI;  Surgeon: Yolonda Kida, MD;  Location: [place] CV LAB;  Service: Cardiovascular;  Laterality: N/A;   CARDIAC CATHETERIZATION N/A 11/02/2016   Procedure: Coronary Stent Intervention;  Surgeon: Yolonda Kida, MD;  Location: [place] CV LAB;  Service: Cardiovascular;  Laterality: N/A;   CARDIOVERSION N/A 06/29/2022   Procedure: CARDIOVERSION;  Surgeon: Corey Skains, MD;  Location: [facility] ORS;  Service: Cardiovascular;  Laterality: N/A;   CHOLECYSTECTOMY     CIRCUMCISION N/A 06/09/2019   Procedure: CIRCUMCISION ADULT;  Surgeon: Billey Co, MD;  Location: [facility] ORS;  Service: Urology;  Laterality: N/A;   COLONOSCOPY WITH PROPOFOL N/A 04/01/2018   Procedure: COLONOSCOPY WITH PROPOFOL;  Surgeon: Manya Silvas, MD;  Location: [facility] ENDOSCOPY;  Service: Endoscopy;  Laterality: N/A;   ESOPHAGEAL DILATION     ESOPHAGOGASTRODUODENOSCOPY (EGD) WITH PROPOFOL N/A 04/01/2018   Procedure: ESOPHAGOGASTRODUODENOSCOPY (EGD) WITH PROPOFOL;  Surgeon: Manya Silvas, MD;  Location: [facility] ENDOSCOPY;  Service: Endoscopy;  Laterality: N/A;   LEFT HEART CATH AND CORS/GRAFTS ANGIOGRAPHY N/A 06/12/2019   Procedure: LEFT HEART CATH AND CORS/GRAFTS ANGIOGRAPHY;  Surgeon: Teodoro Spray, MD;  Location: [place] CV LAB;  Service: Cardiovascular;  Laterality: N/A;   LEFT HEART CATH AND CORS/GRAFTS ANGIOGRAPHY N/A 03/11/2020   Procedure: LEFT HEART CATH AND CORS/GRAFTS ANGIOGRAPHY;  Surgeon: Isaias Cowman, MD;  Location: [place] CV LAB;  Service: Cardiovascular;  Laterality: N/A;   LEFT HEART CATH AND CORS/GRAFTS ANGIOGRAPHY N/A 05/01/2021   Procedure: LEFT HEART CATH AND CORS/GRAFTS ANGIOGRAPHY;  Surgeon: Corey Skains, MD;  Location: [place] CV LAB;  Service: Cardiovascular;  Laterality: N/A;   TONSILLECTOMY     VASCULAR SURGERY     Social History:  reports  that he quit smoking about 9 years ago. His smoking use included cigarettes. He has a 150.00 pack-year smoking history. He has never used smokeless tobacco. He reports current drug use. Drug: Marijuana. He reports that he does not drink alcohol.  Allergies  Allergen Reactions   Demerol  [Meperidine Hcl]    Demerol [Meperidine] Hives   Jardiance [Empagliflozin] Other (See Comments)    Perineal pain   Prednisone Other (See Comments) and Hypertension    Pt states that this medication puts him in A-fib    Sulfa Antibiotics Hives   Albuterol Sulfate [Albuterol] Palpitations and Other (See Comments)    Pt currently uses this medication.     Morphine Sulfate Nausea And Vomiting, Rash and Other (See Comments)    Pt states that he is only allergic to the tablet form of this medication.      Family History  Problem Relation Age of Onset   Heart attack Mother  Depression Mother    Heart disease Mother    COPD Mother    Hypertension Mother    Heart attack Father    Diabetes Father    Depression Father    Heart disease Father    Cirrhosis Father    Parkinson's disease Brother     Prior to Admission medications   Medication Sig Start Date End Date Taking? Authorizing Provider  Accu-Chek Softclix Lancets lancets Use as instructed to check sugar daily for type 2 diabetes. 03/03/21   Birdie Sons, MD  albuterol (VENTOLIN HFA) 108 (90 Base) MCG/ACT inhaler INHALE 2 PUFFS BY MOUTH EVERY 6 HOURS AS NEEDED FOR SHORTNESS OF BREATH 12/02/21   Birdie Sons, MD  allopurinol (ZYLOPRIM) 300 MG tablet TAKE 1 TABLET BY MOUTH TWICE A DAY 10/15/21   Birdie Sons, MD  ALPRAZolam Duanne Moron) 1 MG tablet Take 1 tablet (1 mg total) by mouth 3 (three) times daily. 06/18/22   Birdie Sons, MD  amiodarone (PACERONE) 400 MG tablet Take 1 tablet (400 mg total) by mouth 2 (two) times daily. 06/30/22   Lorella Nimrod, MD  amLODipine (NORVASC) 2.5 MG tablet TAKE 1 TABLET BY MOUTH DAILY 03/02/22   Birdie Sons, MD  aspirin 81 MG chewable tablet Chew 81 mg by mouth daily. Swallows whole    [provider]  atorvastatin (LIPITOR) 80 MG tablet TAKE 1 TABLET BY MOUTH AT BEDTIME 04/16/22   Birdie Sons, MD  BELSOMRA 5 MG TABS TAKE 1 TABLET (5 MG TOTAL) BY MOUTH AT BEDTIME AS NEEDED 06/17/22   Birdie Sons, MD  Blood Glucose Calibration (ACCU-CHEK GUIDE CONTROL) LIQD Use with blood glucose monitor as directed 02/20/21   Birdie Sons, MD  Blood Glucose Monitoring Suppl (ACCU-CHEK GUIDE) w/Device KIT Use to check blood sugars as directed 02/20/21   Birdie Sons, MD  celecoxib (CELEBREX) 200 MG capsule Take 1 capsule by mouth 2 (two) times daily. 11/03/21   [provider]  cetirizine (ZYRTEC) 10 MG tablet TAKE 1 TABLET BY MOUTH AT BEDTIME 04/16/22   Birdie Sons, MD  cyclobenzaprine (FLEXERIL) 5 MG tablet Take 1 tablet by mouth 3 (three) times daily. Patient not taking: Reported on 07/06/2022    [provider]  docusate sodium (COLACE) 100 MG capsule Take 100 mg by mouth daily as needed for mild constipation.    [provider]  ELIQUIS 5 MG TABS tablet TAKE 1 TABLET BY MOUTH TWICE A DAY 05/26/21   Birdie Sons, MD  FARXIGA 10 MG TABS tablet TAKE 1 TABLET BY MOUTH DAILY 02/09/22   Birdie Sons, MD  fluticasone furoate-vilanterol (BREO ELLIPTA) 100-25 MCG/INH AEPB INHALE 1 PUFF BY MOUTH INTO THE LUNGS DAILY. 07/18/21   Birdie Sons, MD  glucose blood (ACCU-CHEK GUIDE) test strip Use as instructed to check sugar daily for type 2 diabetes. 03/03/21   Birdie Sons, MD  isosorbide mononitrate (IMDUR) 120 MG 24 hr tablet TAKE 1 TABLET BY MOUTH DAILY Patient not taking: Reported on 07/06/2022 08/20/21   Birdie Sons, MD  linaclotide Rolan Lipa) 72 MCG capsule Take 72 mcg by mouth daily before breakfast.  Patient not taking: Reported on 07/06/2022    [provider]  lisinopril (ZESTRIL) 10 MG tablet TAKE 1 TABLET BY MOUTH DAILY 09/17/21   Birdie Sons, MD  magnesium oxide (MAG-OX) 400 MG tablet Take 1 tablet by mouth daily. Patient not taking: Reported on 07/06/2022 03/23/17  [provider]  metFORMIN (GLUCOPHAGE-XR) 500 MG 24 hr tablet TAKE 1 TABLET BY MOUTH TWICE A DAY 08/28/21   Birdie Sons, MD  methocarbamol (ROBAXIN) 500 MG tablet TAKE 1 TABLET BY MOUTH EVERY 8 HOURS AS NEEDED FOR MUSCLE SPASMS 03/02/22   Birdie Sons, MD  metoprolol tartrate (LOPRESSOR) 25 MG tablet Take 25 mg by mouth 2 (two) times daily. Patient not taking: Reported on 07/06/2022 06/27/22   [provider]  naloxone [facility]) nasal spray 4 mg/0.1 mL 1 spray into nostril x1 and may repeat every 2-3 minutes until patient is responsive or EMS arrives 08/21/20   Birdie Sons, MD  nitroGLYCERIN (NITROSTAT) 0.4 MG SL tablet PLACE ONE TABLET UNDER THE TONGUE EVERY 5 MINUTES FOR CHEST PAIN. IF NO RELIEF PAST 3RD TAB GO TO EMERGENCY ROOM 12/02/21   Birdie Sons, MD  omega-3 acid ethyl esters (LOVAZA) 1 g capsule TAKE 4 CAPSULES BY MOUTH DAILY 08/20/21   Birdie Sons, MD  omeprazole (PRILOSEC) 40 MG capsule TAKE 1 CAPSULE BY MOUTH 2 TIMES DAILY 30 MINUTES BEFORE BREAKFAST AND DINNER 03/10/22   Birdie Sons, MD  ondansetron (ZOFRAN-ODT) 4 MG disintegrating tablet Take 1 tablet (4 mg total) by mouth every 8 (eight) hours as needed for nausea or vomiting. 04/08/22   Birdie Sons, MD  oxyCODONE-acetaminophen (PERCOCET) 10-325 MG tablet TAKE 1 TABLET BY MOUTH EVERY 4 HOURS AS NEEDED FOR PAIN 06/17/22   Birdie Sons, MD  ranolazine (RANEXA) 1000 MG SR tablet Take 1 tablet (1,000 mg total) by mouth 2 (two) times daily. 04/02/21   Birdie Sons, MD  SPIRIVA HANDIHALER 18 MCG inhalation capsule PLACE 1 CAPSULE INTO INHALER AND INHALE BY MOUTH ONCE A DAY. 06/02/22   Birdie Sons, MD  torsemide (DEMADEX) 100 MG tablet Take 0.5 tablets (50 mg total) by mouth daily. Patient not taking: Reported on 07/06/2022 05/13/20   Birdie Sons, MD  traZODone  (DESYREL) 150 MG tablet TAKE 1 TABLET BY MOUTH AT BEDTIME 09/01/21   Birdie Sons, MD  venlafaxine XR (EFFEXOR-XR) 75 MG 24 hr capsule TAKE 1 CAPSULE BY MOUTH DAILY WITH BREAKFAST 12/16/21   Birdie Sons, MD    Physical Exam: Vitals:   07/08/22 0300 07/08/22 0300 07/08/22 0330 07/08/22 0400  BP:   (!) 139/90 (!) 154/97  Pulse: (!) 50  93 (!) 103  Resp: 14  13 18   Temp:  97.9 F (36.6 C)    TempSrc:  Oral    SpO2: 97%  97% 96%  Weight:      Height:       Physical Exam Vitals and nursing note reviewed.  Constitutional:      General: He is not in acute distress. HENT:     Head: Normocephalic and atraumatic.  Cardiovascular:     Rate and Rhythm: Normal rate and regular rhythm.     Heart sounds: Normal heart sounds.  Pulmonary:     Effort: Pulmonary effort is normal.     Breath sounds: Normal breath sounds.  Abdominal:     Palpations: Abdomen is soft.     Tenderness: There is no abdominal tenderness.  Neurological:     Mental Status: Mental status is at baseline.     Labs on Admission: I have personally reviewed following labs and imaging studies  CBC: Recent Labs  Lab 07/03/22 1228 07/07/22 2322  WBC 10.3 8.5  HGB 19.3* 16.7  HCT 60.4* 50.8  MCV 91.7  91.5  PLT 353 937   Basic Metabolic Panel: Recent Labs  Lab 07/03/22 1228 07/07/22 2322  NA 141 142  K 3.1* 3.3*  CL 95* 106  CO2 33* 26  GLUCOSE 204* 172*  BUN 13 13  CREATININE 1.07 0.67  CALCIUM 10.1 9.8   GFR: Estimated Creatinine Clearance: 92.6 mL/min (by C-G formula based on SCr of 0.67 mg/dL). Liver Function Tests: No results for input(s): "AST", "ALT", "ALKPHOS", "BILITOT", "PROT", "ALBUMIN" in the last 168 hours. No results for input(s): "LIPASE", "AMYLASE" in the last 168 hours. No results for input(s): "AMMONIA" in the last 168 hours. Coagulation Profile: No results for input(s): "INR", "PROTIME" in the last 168 hours. Cardiac Enzymes: No results for input(s): "CKTOTAL", "CKMB",  "CKMBINDEX", "TROPONINI" in the last 168 hours. BNP (last 3 results) No results for input(s): "PROBNP" in the last 8760 hours. HbA1C: No results for input(s): "HGBA1C" in the last 72 hours. CBG: No results for input(s): "GLUCAP" in the last 168 hours. Lipid Profile: No results for input(s): "CHOL", "HDL", "LDLCALC", "TRIG", "CHOLHDL", "LDLDIRECT" in the last 72 hours. Thyroid Function Tests: No results for input(s): "TSH", "T4TOTAL", "FREET4", "T3FREE", "THYROIDAB" in the last 72 hours. Anemia Panel: No results for input(s): "VITAMINB12", "FOLATE", "FERRITIN", "TIBC", "IRON", "RETICCTPCT" in the last 72 hours. Urine analysis:    Component Value Date/Time   COLORURINE YELLOW (A) 06/28/2022 0232   APPEARANCEUR CLEAR (A) 06/28/2022 0232   APPEARANCEUR Clear 05/25/2019 1554   LABSPEC 1.038 (H) 06/28/2022 0232   LABSPEC 1.012 03/07/2015 2143   PHURINE 6.0 06/28/2022 0232   GLUCOSEU >=500 (A) 06/28/2022 0232   GLUCOSEU Negative 03/07/2015 2143   HGBUR NEGATIVE 06/28/2022 0232   BILIRUBINUR NEGATIVE 06/28/2022 0232   BILIRUBINUR Negative 05/25/2019 1554   BILIRUBINUR Negative 03/07/2015 2143   KETONESUR NEGATIVE 06/28/2022 0232   PROTEINUR 100 (A) 06/28/2022 0232   UROBILINOGEN 0.2 03/26/2017 0907   NITRITE NEGATIVE 06/28/2022 0232   LEUKOCYTESUR NEGATIVE 06/28/2022 0232   LEUKOCYTESUR Negative 03/07/2015 2143    Radiological Exams on Admission: DG Chest Portable 1 View  Result Date: 07/07/2022 CLINICAL DATA:  Chest pain and shortness of breath EXAM: PORTABLE CHEST 1 VIEW COMPARISON:  07/03/2022 FINDINGS: Prior median sternotomy and CABG. Coronary artery calcifications or stent. Stable heart size and mediastinal contours. There is mild interstitial thickening that is new from prior exam. No pleural fluid or pneumothorax. No confluent consolidation. IMPRESSION: 1. Mild interstitial thickening, may be pulmonary edema or atypical infection. 2. Prior CABG. Electronically Signed   By:  Keith Rake M.D.   On: 07/07/2022 23:47     Data Reviewed: Relevant notes from primary care and specialist visits, past discharge summaries as available in EHR, including Care Everywhere. Prior diagnostic testing as pertinent to current admission diagnoses Updated medications and problem lists for reconciliation ED course, including vitals, labs, imaging, treatment and response to treatment Triage notes, nursing and pharmacy notes and ED provider's notes Notable results as noted in HPI   Assessment and Plan: Acute on chronic systolic CHF (congestive heart failure) (HCC) Shortness of breath with elevated BNP in the 300s and chest x-ray showing possible pulmonary edema IV Lasix Continue home lisinopril and metoprolol Daily weights with intake and output monitoring Last EF at [facility] clinic 06/15/2022 with EF 50%  Anginal pain, suspect stable angina ([place]) History of CABG and PCI Troponin negative x2 and EKG nonacute Continue apixaban, aspirin, atorvastatin, Ranexa and isosorbide Cardiology consult  COPD exacerbation ([place]) Chronic respiratory failure with hypoxia Schedule and  as needed nebulized bronchodilator treatment Continue home steroids Continue supplemental oxygen  Atrial fibrillation with RVR ([place]) Patient with persistent A-fib.  Had RVR in the ED which improved with diltiazem bolus Continue home amiodarone and Eliquis Updated med rec: Patient has not been taking metoprolol Etiology consult.  Patient recently had cardioversion attempts  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HCC) Sliding scale insulin coverage  Back pain with left-sided radiculopathy Chronic and stable. Continue percocet prn        DVT prophylaxis: Eliquis  Consults: Cardiology, Dr. [name]  Advance Care Planning:   Code Status: Prior   Family Communication: none  Disposition Plan: Back to previous home environment  Severity of Illness: The appropriate patient status for  this patient is INPATIENT. Inpatient status is judged to be reasonable and necessary in order to provide the required intensity of service to ensure the patient's safety. The patient's presenting symptoms, physical exam findings, and initial radiographic and laboratory data in the context of their chronic comorbidities is felt to place them at high risk for further clinical deterioration. Furthermore, it is not anticipated that the patient will be medically stable for discharge from the hospital within 2 midnights of admission.   * I certify that at the point of admission it is my clinical judgment that the patient will require inpatient hospital care spanning beyond 2 midnights from the point of admission due to high intensity of service, high risk for further deterioration and high frequency of surveillance required.*  Author: Athena Masse, MD 07/08/2022 4:29 AM  For on call review [email or web address].

## 2022-07-08 NOTE — Assessment & Plan Note (Signed)
Shortness of breath with elevated BNP in the 300s and chest x-ray showing possible pulmonary edema IV Lasix Continue home lisinopril and metoprolol Daily weights with intake and output monitoring Last EF at [facility] clinic 06/15/2022 with EF 50%

## 2022-07-09 ENCOUNTER — Telehealth: Payer: Self-pay

## 2022-07-09 NOTE — Telephone Encounter (Signed)
     Patient  visit on 8/18  at [place] was for COPD/ A FIB  Have you been able to follow up with your primary care physician? YES  The patient was or was not able to obtain any needed medicine or equipment.YES  Are there diet recommendations that you are having difficulty following?NA  Patient expresses understanding of discharge instructions and education provided has no other needs at this time. [place], Care Management  [phone] [address], [place], [place] [address] Phone: [phone] Email: [email or web address]

## 2022-07-09 NOTE — Telephone Encounter (Signed)
Transition Care Management Follow-up Telephone Call Date of discharge and from where: TCM [facility] 07-08-22 Dx: Chest pain  How have you been since you were released from the hospital? Feeling pretty good  Any questions or concerns? No  Items Reviewed: Did the pt receive and understand the discharge instructions provided? Yes  Medications obtained and verified? Yes  Other? No  Any new allergies since your discharge? no Dietary orders reviewed? Yes Do you have support at home? Yes   Home Care and Equipment/Supplies: Were home health services ordered? no If so, what is the name of the agency? na  Has the agency set up a time to come to the patient's home? not applicable Were any new equipment or medical supplies ordered?  No What is the name of the medical supply agency? na Were you able to get the supplies/equipment? not applicable Do you have any questions related to the use of the equipment or supplies? No  Functional Questionnaire: (I = Independent and D = Dependent) ADLs: I  Bathing/Dressing- I  Meal Prep- I  Eating- I  Maintaining continence- I  Transferring/Ambulation- I  Managing Meds- I  Follow up appointments reviewed:  PCP Hospital f/u appt confirmed? Yes  [name] Yes  Scheduled to see Dr Letta Kocher cardiologist on 07-14-22 @ 230pm. Are transportation arrangements needed? No  If their condition worsens, is the pt aware to call PCP or go to the Emergency Dept.? Yes Was the patient provided with contact information for the PCP's office or ED? Yes Was to pt encouraged to call back with questions or concerns? Yes

## 2022-07-10 DIAGNOSIS — I25119 Atherosclerotic heart disease of native coronary artery with unspecified angina pectoris: Secondary | ICD-10-CM | POA: Diagnosis not present

## 2022-07-10 DIAGNOSIS — I5032 Chronic diastolic (congestive) heart failure: Secondary | ICD-10-CM | POA: Diagnosis not present

## 2022-07-10 DIAGNOSIS — J9611 Chronic respiratory failure with hypoxia: Secondary | ICD-10-CM | POA: Diagnosis not present

## 2022-07-10 DIAGNOSIS — I4891 Unspecified atrial fibrillation: Secondary | ICD-10-CM | POA: Diagnosis not present

## 2022-07-10 DIAGNOSIS — I110 Hypertensive heart disease with heart failure: Secondary | ICD-10-CM | POA: Diagnosis not present

## 2022-07-10 DIAGNOSIS — J441 Chronic obstructive pulmonary disease with (acute) exacerbation: Secondary | ICD-10-CM | POA: Diagnosis not present

## 2022-07-10 DIAGNOSIS — I471 Supraventricular tachycardia: Secondary | ICD-10-CM

## 2022-07-10 DIAGNOSIS — I252 Old myocardial infarction: Secondary | ICD-10-CM | POA: Diagnosis not present

## 2022-07-10 DIAGNOSIS — E1121 Type 2 diabetes mellitus with diabetic nephropathy: Secondary | ICD-10-CM | POA: Diagnosis not present

## 2022-07-10 DIAGNOSIS — I5023 Acute on chronic systolic (congestive) heart failure: Secondary | ICD-10-CM | POA: Diagnosis not present

## 2022-07-13 ENCOUNTER — Ambulatory Visit: Payer: Self-pay

## 2022-07-13 NOTE — Chronic Care Management (AMB) (Signed)
   07/13/2022  Lawrence [name] [birth year] [id number]  Documentation encounter created to complete case transition.   Discussed recent hospital admission on 07/07/22 d/t COPD exacerbation. Reports feeling well. Denies worsening or concerning symptoms since hospital discharge. Completed post hospital follow up earlier today.  The care management team will continue to follow for care coordination needs. Telephonic outreach scheduled.  [place] Management [phone]

## 2022-07-14 DIAGNOSIS — Z955 Presence of coronary angioplasty implant and graft: Secondary | ICD-10-CM | POA: Diagnosis not present

## 2022-07-14 DIAGNOSIS — E669 Obesity, unspecified: Secondary | ICD-10-CM | POA: Diagnosis not present

## 2022-07-14 DIAGNOSIS — I214 Non-ST elevation (NSTEMI) myocardial infarction: Secondary | ICD-10-CM | POA: Diagnosis not present

## 2022-07-14 DIAGNOSIS — I5032 Chronic diastolic (congestive) heart failure: Secondary | ICD-10-CM | POA: Diagnosis not present

## 2022-07-14 DIAGNOSIS — Z95828 Presence of other vascular implants and grafts: Secondary | ICD-10-CM | POA: Diagnosis not present

## 2022-07-14 DIAGNOSIS — I25708 Atherosclerosis of coronary artery bypass graft(s), unspecified, with other forms of angina pectoris: Secondary | ICD-10-CM | POA: Diagnosis not present

## 2022-07-14 DIAGNOSIS — Z951 Presence of aortocoronary bypass graft: Secondary | ICD-10-CM | POA: Diagnosis not present

## 2022-07-15 ENCOUNTER — Emergency Department: Payer: Medicare HMO

## 2022-07-15 ENCOUNTER — Emergency Department
Admission: EM | Admit: 2022-07-15 | Discharge: 2022-07-16 | Disposition: A | Discharge status: Home / Self Care | Payer: Medicare HMO | Attending: Emergency Medicine | Admitting: Emergency Medicine

## 2022-07-15 ENCOUNTER — Other Ambulatory Visit: Payer: Self-pay

## 2022-07-15 ENCOUNTER — Ambulatory Visit (INDEPENDENT_AMBULATORY_CARE_PROVIDER_SITE_OTHER): Payer: Medicare HMO | Admitting: Family Medicine

## 2022-07-15 ENCOUNTER — Encounter: Payer: Self-pay | Admitting: *Deleted

## 2022-07-15 ENCOUNTER — Other Ambulatory Visit: Payer: Self-pay | Admitting: Family Medicine

## 2022-07-15 ENCOUNTER — Ambulatory Visit
Admission: RE | Admit: 2022-07-15 | Discharge: 2022-07-15 | Disposition: A | Discharge status: Home / Self Care | Payer: Medicare HMO | Source: Ambulatory Visit | Attending: Family Medicine | Admitting: Family Medicine

## 2022-07-15 ENCOUNTER — Telehealth: Payer: Self-pay

## 2022-07-15 ENCOUNTER — Encounter: Payer: Self-pay | Admitting: Family Medicine

## 2022-07-15 VITALS — BP 159/108 | HR 86 | Wt 184.5 lb

## 2022-07-15 DIAGNOSIS — R112 Nausea with vomiting, unspecified: Secondary | ICD-10-CM | POA: Insufficient documentation

## 2022-07-15 DIAGNOSIS — R42 Dizziness and giddiness: Secondary | ICD-10-CM | POA: Diagnosis not present

## 2022-07-15 DIAGNOSIS — Z951 Presence of aortocoronary bypass graft: Secondary | ICD-10-CM | POA: Diagnosis not present

## 2022-07-15 DIAGNOSIS — I110 Hypertensive heart disease with heart failure: Secondary | ICD-10-CM | POA: Insufficient documentation

## 2022-07-15 DIAGNOSIS — R1110 Vomiting, unspecified: Secondary | ICD-10-CM | POA: Insufficient documentation

## 2022-07-15 DIAGNOSIS — R270 Ataxia, unspecified: Secondary | ICD-10-CM

## 2022-07-15 DIAGNOSIS — J449 Chronic obstructive pulmonary disease, unspecified: Secondary | ICD-10-CM | POA: Insufficient documentation

## 2022-07-15 DIAGNOSIS — Z7984 Long term (current) use of oral hypoglycemic drugs: Secondary | ICD-10-CM | POA: Insufficient documentation

## 2022-07-15 DIAGNOSIS — Z7901 Long term (current) use of anticoagulants: Secondary | ICD-10-CM

## 2022-07-15 DIAGNOSIS — R0789 Other chest pain: Secondary | ICD-10-CM | POA: Insufficient documentation

## 2022-07-15 DIAGNOSIS — Z7982 Long term (current) use of aspirin: Secondary | ICD-10-CM | POA: Insufficient documentation

## 2022-07-15 DIAGNOSIS — I2510 Atherosclerotic heart disease of native coronary artery without angina pectoris: Secondary | ICD-10-CM | POA: Insufficient documentation

## 2022-07-15 DIAGNOSIS — E119 Type 2 diabetes mellitus without complications: Secondary | ICD-10-CM | POA: Diagnosis not present

## 2022-07-15 DIAGNOSIS — Z79899 Other long term (current) drug therapy: Secondary | ICD-10-CM | POA: Insufficient documentation

## 2022-07-15 DIAGNOSIS — R29818 Other symptoms and signs involving the nervous system: Secondary | ICD-10-CM | POA: Diagnosis not present

## 2022-07-15 DIAGNOSIS — I10 Essential (primary) hypertension: Secondary | ICD-10-CM | POA: Diagnosis not present

## 2022-07-15 DIAGNOSIS — R030 Elevated blood-pressure reading, without diagnosis of hypertension: Secondary | ICD-10-CM | POA: Diagnosis present

## 2022-07-15 DIAGNOSIS — Z87891 Personal history of nicotine dependence: Secondary | ICD-10-CM | POA: Diagnosis not present

## 2022-07-15 DIAGNOSIS — J45909 Unspecified asthma, uncomplicated: Secondary | ICD-10-CM | POA: Insufficient documentation

## 2022-07-15 DIAGNOSIS — I5032 Chronic diastolic (congestive) heart failure: Secondary | ICD-10-CM | POA: Insufficient documentation

## 2022-07-15 DIAGNOSIS — R079 Chest pain, unspecified: Secondary | ICD-10-CM

## 2022-07-15 DIAGNOSIS — R531 Weakness: Secondary | ICD-10-CM | POA: Diagnosis not present

## 2022-07-15 DIAGNOSIS — I6782 Cerebral ischemia: Secondary | ICD-10-CM | POA: Diagnosis not present

## 2022-07-15 DIAGNOSIS — K219 Gastro-esophageal reflux disease without esophagitis: Secondary | ICD-10-CM

## 2022-07-15 LAB — BASIC METABOLIC PANEL
Anion gap: 8 (ref 5–15)
BUN: 19 mg/dL (ref 8–23)
CO2: 29 mmol/L (ref 22–32)
Calcium: 9.4 mg/dL (ref 8.9–10.3)
Chloride: 102 mmol/L (ref 98–111)
Creatinine, Ser: 0.72 mg/dL (ref 0.61–1.24)
GFR, Estimated: >60 mL/min (ref >60)
Glucose, Bld: 140 mg/dL — ABNORMAL HIGH (ref 70–99)
Potassium: 3.7 mmol/L (ref 3.5–5.1)
Sodium: 139 mmol/L (ref 135–145)

## 2022-07-15 LAB — CBC
HCT: 53.2 % — ABNORMAL HIGH (ref 39.0–52.0)
Hemoglobin: 17.5 g/dL — ABNORMAL HIGH (ref 13.0–17.0)
MCH: 30 pg (ref 26.0–34.0)
MCHC: 32.9 g/dL (ref 30.0–36.0)
MCV: 91.3 fL (ref 80.0–100.0)
Platelets: 213 10*3/uL (ref 150–400)
RBC: 5.83 MIL/uL — ABNORMAL HIGH (ref 4.22–5.81)
RDW: 15.3 % (ref 11.5–15.5)
WBC: 7.8 10*3/uL (ref 4.0–10.5)
nRBC: 0 % (ref 0.0–0.2)

## 2022-07-15 LAB — TROPONIN I (HIGH SENSITIVITY)
Troponin I (High Sensitivity): 11 ng/L (ref <18)
Troponin I (High Sensitivity): 13 ng/L (ref <18)

## 2022-07-15 NOTE — ED Triage Notes (Signed)
Pt ambulatory to triage.  Pt has elevated blood pressure and feels weak today .  No n/v/  no chest pain or sob.  Pt alert  speech clear.

## 2022-07-15 NOTE — Progress Notes (Signed)
I,Sha'taria Tyson,acting as a Education administrator for [name], MD.,have documented all relevant documentation on the behalf of [name], MD,as directed by  [name], MD while in the presence of [name], MD.   Established patient visit   Patient: Lawrence [name]   DOB: 1956/04/22   66 y.o. Male  MRN: [id number] Visit Date: 07/15/2022  Today's healthcare provider: [name], MD   No chief complaint on file.  Subjective    HPI  Follow up Hospitalization  Patient was admitted to [facility] on 07/07/22 and discharged on 07/08/22. He was treated for chest pain, CHF exacerbation, rapid a-fib and COPD exacerbation. He had been hospitalized the week before with bronchitis and rapid a-fib with unsuccessful cardioversion. During hospitalization of 07-07-22 he had normal troponin lveles. He was treated with dunebs and prednisolone for COPD exacerbation. He was discharged on amiodarone 200 and new prescription for diltiazem 180  He also reports feeling wobbly starting yesterday morning and getting slowly worse since then. Feels off balance, having difficult standing and walking without falling. Developed rapid onset nausea and vomiting while in waiting room for today's visit. Nausea improved after vomiting, but still having difficulty with balance.  ----------------------------------------------------------------------------------------- -    Medications: Outpatient Medications Prior to Visit  Medication Sig   Accu-Chek Softclix Lancets lancets Use as instructed to check sugar daily for type 2 diabetes.   albuterol (VENTOLIN HFA) 108 (90 Base) MCG/ACT inhaler INHALE 2 PUFFS BY MOUTH EVERY 6 HOURS AS NEEDED FOR SHORTNESS OF BREATH   allopurinol (ZYLOPRIM) 300 MG tablet TAKE 1 TABLET BY MOUTH TWICE A DAY   ALPRAZolam (XANAX) 1 MG tablet Take 1 tablet (1 mg total) by mouth 3 (three) times daily.   amiodarone (PACERONE) 200 MG tablet Take 200 mg by mouth 2 (two) times daily.   aspirin 81 MG  chewable tablet Chew 81 mg by mouth daily. Swallows whole   atorvastatin (LIPITOR) 80 MG tablet TAKE 1 TABLET BY MOUTH AT BEDTIME   BELSOMRA 5 MG TABS TAKE 1 TABLET (5 MG TOTAL) BY MOUTH AT BEDTIME AS NEEDED   Blood Glucose Calibration (ACCU-CHEK GUIDE CONTROL) LIQD Use with blood glucose monitor as directed   Blood Glucose Monitoring Suppl (ACCU-CHEK GUIDE) w/Device KIT Use to check blood sugars as directed   celecoxib (CELEBREX) 200 MG capsule Take 1 capsule by mouth 2 (two) times daily.   cetirizine (ZYRTEC) 10 MG tablet TAKE 1 TABLET BY MOUTH AT BEDTIME   diltiazem (CARDIZEM CD) 180 MG 24 hr capsule Take 1 capsule (180 mg total) by mouth daily.   docusate sodium (COLACE) 100 MG capsule Take 100 mg by mouth daily as needed for mild constipation.   ELIQUIS 5 MG TABS tablet TAKE 1 TABLET BY MOUTH TWICE A DAY   FARXIGA 10 MG TABS tablet TAKE 1 TABLET BY MOUTH DAILY   fluticasone furoate-vilanterol (BREO ELLIPTA) 100-25 MCG/INH AEPB INHALE 1 PUFF BY MOUTH INTO THE LUNGS DAILY.   glucose blood (ACCU-CHEK GUIDE) test strip Use as instructed to check sugar daily for type 2 diabetes.   isosorbide mononitrate (IMDUR) 120 MG 24 hr tablet TAKE 1 TABLET BY MOUTH DAILY   linaclotide (LINZESS) 72 MCG capsule Take 72 mcg by mouth daily before breakfast.   lisinopril (ZESTRIL) 10 MG tablet TAKE 1 TABLET BY MOUTH DAILY   magnesium oxide (MAG-OX) 400 MG tablet Take 1 tablet by mouth daily.   metFORMIN (GLUCOPHAGE-XR) 500 MG 24 hr tablet TAKE 1 TABLET BY MOUTH TWICE A  DAY   methocarbamol (ROBAXIN) 500 MG tablet TAKE 1 TABLET BY MOUTH EVERY 8 HOURS AS NEEDED FOR MUSCLE SPASMS   naloxone (NARCAN) nasal spray 4 mg/0.1 mL 1 spray into nostril x1 and may repeat every 2-3 minutes until patient is responsive or EMS arrives   nitroGLYCERIN (NITROSTAT) 0.4 MG SL tablet PLACE ONE TABLET UNDER THE TONGUE EVERY 5 MINUTES FOR CHEST PAIN. IF NO RELIEF PAST 3RD TAB GO TO EMERGENCY ROOM   omega-3 acid ethyl esters (LOVAZA) 1 g  capsule TAKE 4 CAPSULES BY MOUTH DAILY   omeprazole (PRILOSEC) 40 MG capsule TAKE 1 CAPSULE BY MOUTH 2 TIMES DAILY 30 MINUTES BEFORE BREAKFAST AND DINNER   ondansetron (ZOFRAN-ODT) 4 MG disintegrating tablet Take 1 tablet (4 mg total) by mouth every 8 (eight) hours as needed for nausea or vomiting.   oxyCODONE-acetaminophen (PERCOCET) 10-325 MG tablet TAKE 1 TABLET BY MOUTH EVERY 4 HOURS AS NEEDED FOR PAIN   ranolazine (RANEXA) 1000 MG SR tablet Take 1 tablet (1,000 mg total) by mouth 2 (two) times daily.   SPIRIVA HANDIHALER 18 MCG inhalation capsule PLACE 1 CAPSULE INTO INHALER AND INHALE BY MOUTH ONCE A DAY.   torsemide (DEMADEX) 100 MG tablet Take 0.5 tablets (50 mg total) by mouth daily.   traZODone (DESYREL) 150 MG tablet TAKE 1 TABLET BY MOUTH AT BEDTIME   venlafaxine XR (EFFEXOR-XR) 75 MG 24 hr capsule TAKE 1 CAPSULE BY MOUTH DAILY WITH BREAKFAST   No facility-administered medications prior to visit.    Review of Systems  Constitutional:  Negative for chills, diaphoresis, fatigue and fever.  HENT:  Negative for congestion, hearing loss, sinus pressure and sinus pain.   Respiratory:  Negative for chest tightness, shortness of breath and wheezing.   Cardiovascular:  Negative for chest pain.  Gastrointestinal:  Positive for nausea and vomiting. Negative for abdominal pain and diarrhea.  Neurological:  Positive for dizziness and weakness. Negative for tremors, speech difficulty, numbness and headaches.  Psychiatric/Behavioral:  Negative for confusion.        Objective    BP (!) 159/108 (BP Location: Left Arm, Patient Position: Sitting, Cuff Size: Normal)   Pulse 86   Wt 184 lb 8 oz (83.7 kg)   SpO2 100%   BMI 28.90 kg/m    Physical Exam   General: Appearance:    Well developed, well nourished male in no acute distress  Eyes:    PERRL, conjunctiva/corneas clear, EOM's intact       Lungs:     Clear to auscultation bilaterally, respirations unlabored  Heart:    Normal heart  rate. Irregularly irregular rhythm. No murmurs, rubs, or gallops.    MS:   All extremities are intact.    Neurologic:   Positive rhomberg. + nystagmus. Unable to to perform finger to nose. Ataxic gait, but does not fall.          Assessment & Plan     1. Ataxia New onset within last 30 hours - CT HEAD WO CONTRAST (5MM); Future  2. Nausea and vomiting, unspecified vomiting type New onset within last hour, no other sign of GI disorder.  - CT HEAD WO CONTRAST (5MM); Future  3. Anticoagulated  - CT HEAD WO CONTRAST (5MM); Future   He is at high risk for cerebral bleeding due to anticoagulation and polypharmacy. Needs stat head CT to rule out cerebellar bleed.      The entirety of the information documented in the History of Present Illness, Review of Systems  and Physical Exam were personally obtained by me. Portions of this information were initially documented by the CMA and reviewed by me for thoroughness and accuracy.     [name], MD  [facility] [phone] (phone) [phone] (fax)  [place]

## 2022-07-15 NOTE — Telephone Encounter (Signed)
Patient advised. He mentioned that his blood pressure was elevated during today's office visit. Patient wants to know what should be done about his blood pressure being so high?  He also wanted to ask what he could take to help with insomnia. He is already taking Trazodone and Belsomra, but they haven't been effective.

## 2022-07-15 NOTE — Telephone Encounter (Signed)
-----   Message from Birdie Sons, MD sent at 07/15/2022  3:34 PM EDT ----- Head CT shows a little sinus congestion but is otherwise normal. If dizziness does not improve then would recommend referral to neurology.

## 2022-07-16 ENCOUNTER — Other Ambulatory Visit: Payer: Self-pay | Admitting: Family Medicine

## 2022-07-16 ENCOUNTER — Emergency Department: Payer: Medicare HMO

## 2022-07-16 DIAGNOSIS — M5410 Radiculopathy, site unspecified: Secondary | ICD-10-CM

## 2022-07-16 DIAGNOSIS — R29818 Other symptoms and signs involving the nervous system: Secondary | ICD-10-CM | POA: Diagnosis not present

## 2022-07-16 DIAGNOSIS — R42 Dizziness and giddiness: Secondary | ICD-10-CM | POA: Diagnosis not present

## 2022-07-16 DIAGNOSIS — I6782 Cerebral ischemia: Secondary | ICD-10-CM | POA: Diagnosis not present

## 2022-07-16 DIAGNOSIS — I110 Hypertensive heart disease with heart failure: Secondary | ICD-10-CM | POA: Diagnosis not present

## 2022-07-16 LAB — URINALYSIS, ROUTINE W REFLEX MICROSCOPIC
Bilirubin Urine: NEGATIVE
Glucose, UA: >500 mg/dL — AB
Hgb urine dipstick: NEGATIVE
Ketones, ur: NEGATIVE mg/dL
Leukocytes,Ua: NEGATIVE
Nitrite: NEGATIVE
Protein, ur: 30 mg/dL — AB
Specific Gravity, Urine: 1.029 (ref 1.005–1.030)
pH: 5 (ref 5.0–8.0)

## 2022-07-16 LAB — BRAIN NATRIURETIC PEPTIDE: B Natriuretic Peptide: 68 pg/mL (ref 0.0–100.0)

## 2022-07-16 MED ORDER — ASPIRIN 81 MG PO CHEW
324.0000 mg | CHEWABLE_TABLET | Freq: Once | ORAL | Status: AC
  Administered 2022-07-16: 324 mg via ORAL
  Filled 2022-07-16: qty 4

## 2022-07-16 MED ORDER — MECLIZINE HCL 25 MG PO TABS
25.0000 mg | ORAL_TABLET | Freq: Three times a day (TID) | ORAL | 0 refills | Status: DC | PRN
Start: 2022-07-16 — End: 2022-08-07

## 2022-07-16 MED ORDER — ACETAMINOPHEN 500 MG PO TABS
1000.0000 mg | ORAL_TABLET | Freq: Once | ORAL | Status: AC
  Administered 2022-07-16: 1000 mg via ORAL
  Filled 2022-07-16: qty 2

## 2022-07-16 MED ORDER — NITROGLYCERIN 0.4 MG SL SUBL
0.4000 mg | SUBLINGUAL_TABLET | SUBLINGUAL | Status: DC | PRN
  Administered 2022-07-16: 0.4 mg via SUBLINGUAL
  Filled 2022-07-16: qty 1

## 2022-07-16 MED ORDER — MECLIZINE HCL 25 MG PO TABS
25.0000 mg | ORAL_TABLET | Freq: Once | ORAL | Status: AC
  Administered 2022-07-16: 25 mg via ORAL
  Filled 2022-07-16: qty 1

## 2022-07-16 NOTE — Telephone Encounter (Signed)
Medication Refill - Medication: oxyCODONE-acetaminophen (PERCOCET) 10-325 MG tablet  Has the patient contacted their pharmacy? Yes.    (Agent: If yes, when and what did the pharmacy advise?) contact provider  Preferred Pharmacy (with phone number or street name):  [facility], [place] Phone:  [phone]  Fax:  [phone]     Has the patient been seen for an appointment in the last year OR does the patient have an upcoming appointment? Yes.    Agent: Please be advised that RX refills may take up to 3 business days. We ask that you follow-up with your pharmacy.

## 2022-07-16 NOTE — ED Notes (Signed)
Pt ambulated in hallway, RN stand-by assist pt. Pt ambulated well. Pt c/o of dizziness at this time.

## 2022-07-16 NOTE — ED Notes (Signed)
Patient transported to MRI 

## 2022-07-16 NOTE — ED Notes (Signed)
Pt presents to weakness and dizziness that started about 3 days ago. Pt states he went to see his PCP and was sent here due to his high blood pressure. Pt states his BP medication was recently changed. Pt also c/o chest pain that started about 1 hour ago. Pt denies any decrease of sensation in extremities.

## 2022-07-16 NOTE — ED Notes (Signed)
RN offered PRN nitroglycerin for chest pain. Pt refused and stated it does not work.

## 2022-07-16 NOTE — Discharge Instructions (Addendum)
Your cardiac work-up today was unremarkable.  Your troponins x2, BNP, chest x-ray were normal.  MRI of your brain showed no stroke.  Urine showed no sign of dehydration or infection.  Your blood pressure here has been in the 120s to 150s/90s.  I recommend close follow-up with your doctor to discuss any changes in your medications.  Please continue everything as prescribed.  I suspect that your dizziness is from vertigo from your inner ear.  I have given you ENT follow-up if symptoms or not improving.

## 2022-07-16 NOTE — ED Provider Notes (Addendum)
[facility] Provider Note    Event Date/Time   First MD Initiated Contact with Patient 07/15/22 2356     (approximate)   History   Weakness   HPI  Lawrence [name] is a 66 y.o. male history of atrial fibrillation on Eliquis, CAD status post CABG, CHF, COPD, hypertension, diabetes who presents to the emergency department with complaints of feeling off balance that started this morning.  States he went to sleep last night in his normal state of health.  States he saw his primary care doctor and was told to come to the emergency department for further evaluation.  He denies headache, head injury, numbness, tingling or focal weakness.  States he has had ringing in his ears for the past 2 days but no fevers, ear pain, hearing loss, congestion, productive cough.  Symptoms present at rest but worse with standing.  He states that he feels like he is "drunk".  He states he did have vomiting this morning in his primary care doctor's office.  No diarrhea.  No bloody stools or melena.  Denies history of vertigo.  Reports his blood pressure has also been elevated today.  He was recently taken off of amlodipine, metoprolol and started on diltiazem and amiodarone.  He also takes lisinopril 10 mg daily and torsemide 50 mg daily.  He also complains of having chest tightness that started while in the waiting room.  No change in his chronic shortness of breath.  No lower extremity swelling or pain.  States that is in the left side of his chest and goes into the top of the left arm.  Does not go into his back or abdomen.  It is not pleuritic, exertional reproducible with palpation.  He states that he has chest pain frequently and this is not a new thing for him.  He just saw his cardiologist Dr. Clayborn Bigness this week and they discussed the possibility of repeat cardiac catheterization in the future.  Last cardiac catheterization was a year ago.   History provided by patient.    Past  Medical History:  Diagnosis Date   A-fib ([place])    Anemia    Anginal pain ([place])    Anxiety    Arthritis    Asthma    CAD (coronary artery disease)    a. 2002 CABGx2 (LIMA->LAD, VG->VG->OM1);  b. 09/2012 DES->OM;  c. 03/2015 PTCA of LAD [facility]) in setting of atretic LIMA; d. 05/2015 Cath [facility]): nonobs dzs; e. 06/2015 Cath ([facility]): LM nl, LAD 45p/d ISR, 50d, D1/2 small, LCX 50p/d ISR, OM1 70ost, 30 ISR, VG->OM1 50ost, 24m LIMA->LAD 99p/d - atretic, RCA dom, nl; f.cath 10/16: 40-50%(FFR 0.90) pLAD, 75% (FFR 0.77) mLAD s/p PCI/DES, oRCA 40% (FFR0.95)   Cancer (HCC)    SKIN CANCER ON BACK   Celiac disease    Chronic diastolic CHF (congestive heart failure) [place]    a. 06/2009 Echo: EF 60-65%, Gr 1 DD, triv AI, mildly dil LA, nl RV.   COPD [place]ronic obstructive [place] disease) [place]    a. Chronic bronchitis and emphysema.   DDD (degenerative disc disease), lumbar    Diverticulosis    Dysrhythmia    Essential hypertension    GERD (gastroesophageal reflux disease)    History of hiatal hernia    History of kidney stones    H/O   History of tobacco abuse    a. Quit 2014.   Myocardial infarction ([facility] 2002   4 STENTS   Pancreatitis  PSVT (paroxysmal supraventricular tachycardia) ([place])    a. 10/2012 Noted on Zio Patch.   Sleep apnea    LOST CORD TO CPAP -ONLY 02 @ BEDTIME   Tubular adenoma of colon    Type II diabetes mellitus ([place])     Past Surgical History:  Procedure Laterality Date   BYPASS GRAFT     CARDIAC CATHETERIZATION N/A 07/12/2015   rocedure: Left Heart Cath and Cors/Grafts Angiography;  Surgeon: Belva Crome, MD;  Location: [place] [facility];  Service: Cardiovascular;  Laterality: N/A;   CARDIAC CATHETERIZATION Right 10/07/2015   Procedure: Left Heart Cath and Cors/Grafts Angiography;  Surgeon: Dionisio David, MD;  Location: [place] [facility];  Service: Cardiovascular;  Laterality: Right;   CARDIAC CATHETERIZATION N/A 04/06/2016   Procedure: Left Heart Cath and Coronary  Angiography;  Surgeon: Yolonda Kida, MD;  Location: [place] [facility];  Service: Cardiovascular;  Laterality: N/A;   CARDIAC CATHETERIZATION  04/06/2016   Procedure: Bypass Graft Angiography;  Surgeon: Yolonda Kida, MD;  Location: [place] [facility];  Service: Cardiovascular;;   CARDIAC CATHETERIZATION N/A 11/02/2016   Procedure: Left Heart Cath and Cors/Grafts Angiography and possible PCI;  Surgeon: Yolonda Kida, MD;  Location: [place] [facility];  Service: Cardiovascular;  Laterality: N/A;   CARDIAC CATHETERIZATION N/A 11/02/2016   Procedure: Coronary Stent Intervention;  Surgeon: Yolonda Kida, MD;  Location: [place] [facility];  Service: Cardiovascular;  Laterality: N/A;   CARDIOVERSION N/A 06/29/2022   Procedure: CARDIOVERSION;  Surgeon: Corey Skains, MD;  Location: [facility] ORS;  Service: Cardiovascular;  Laterality: N/A;   CHOLECYSTECTOMY     CIRCUMCISION N/A 06/09/2019   Procedure: CIRCUMCISION ADULT;  Surgeon: Billey Co, MD;  Location: [facility] ORS;  Service: Urology;  Laterality: N/A;   COLONOSCOPY WITH PROPOFOL N/A 04/01/2018   Procedure: COLONOSCOPY WITH PROPOFOL;  Surgeon: Manya Silvas, MD;  Location: [facility] ENDOSCOPY;  Service: Endoscopy;  Laterality: N/A;   ESOPHAGEAL DILATION     ESOPHAGOGASTRODUODENOSCOPY (EGD) WITH PROPOFOL N/A 04/01/2018   Procedure: ESOPHAGOGASTRODUODENOSCOPY (EGD) WITH PROPOFOL;  Surgeon: Manya Silvas, MD;  Location: [facility] ENDOSCOPY;  Service: Endoscopy;  Laterality: N/A;   LEFT HEART CATH AND CORS/GRAFTS ANGIOGRAPHY N/A 06/12/2019   Procedure: LEFT HEART CATH AND CORS/GRAFTS ANGIOGRAPHY;  Surgeon: Teodoro Spray, MD;  Location: [place] [facility];  Service: Cardiovascular;  Laterality: N/A;   LEFT HEART CATH AND CORS/GRAFTS ANGIOGRAPHY N/A 03/11/2020   Procedure: LEFT HEART CATH AND CORS/GRAFTS ANGIOGRAPHY;  Surgeon: Isaias Cowman, MD;  Location: [place] [facility];  Service: Cardiovascular;  Laterality: N/A;    LEFT HEART CATH AND CORS/GRAFTS ANGIOGRAPHY N/A 05/01/2021   Procedure: LEFT HEART CATH AND CORS/GRAFTS ANGIOGRAPHY;  Surgeon: Corey Skains, MD;  Location: [place] [facility];  Service: Cardiovascular;  Laterality: N/A;   TONSILLECTOMY     VASCULAR SURGERY      MEDICATIONS:  Prior to Admission medications   Medication Sig Start Date End Date Taking? Authorizing Provider  Accu-Chek Softclix Lancets lancets Use as instructed to check sugar daily for type 2 diabetes. 03/03/21   Birdie Sons, MD  albuterol (VENTOLIN HFA) 108 (90 Base) MCG/ACT inhaler INHALE 2 PUFFS BY MOUTH EVERY 6 HOURS AS NEEDED FOR SHORTNESS OF BREATH 12/02/21   Birdie Sons, MD  allopurinol (ZYLOPRIM) 300 MG tablet TAKE 1 TABLET BY MOUTH TWICE A DAY 10/15/21   Birdie Sons, MD  ALPRAZolam Duanne Moron) 1 MG tablet Take 1 tablet (  1 mg total) by mouth 3 (three) times daily. 06/18/22   Birdie Sons, MD  amiodarone (PACERONE) 200 MG tablet Take 200 mg by mouth 2 (two) times daily. 07/06/22   [provider]  aspirin 81 MG chewable tablet Chew 81 mg by mouth daily. Swallows whole    [provider]  atorvastatin (LIPITOR) 80 MG tablet TAKE 1 TABLET BY MOUTH AT BEDTIME 04/16/22   Birdie Sons, MD  BELSOMRA 5 MG TABS TAKE 1 TABLET (5 MG TOTAL) BY MOUTH AT BEDTIME AS NEEDED 06/17/22   Birdie Sons, MD  Blood Glucose Calibration (ACCU-CHEK GUIDE CONTROL) LIQD Use with blood glucose monitor as directed 02/20/21   Birdie Sons, MD  Blood Glucose Monitoring Suppl (ACCU-CHEK GUIDE) w/Device KIT Use to check blood sugars as directed 02/20/21   Birdie Sons, MD  celecoxib (CELEBREX) 200 MG capsule Take 1 capsule by mouth 2 (two) times daily. 11/03/21   [provider]  cetirizine (ZYRTEC) 10 MG tablet TAKE 1 TABLET BY MOUTH AT BEDTIME 04/16/22   Birdie Sons, MD  diltiazem (CARDIZEM CD) 180 MG 24 hr capsule Take 1 capsule (180 mg total) by mouth daily. 07/09/22   Lorella Nimrod, MD  docusate  sodium (COLACE) 100 MG capsule Take 100 mg by mouth daily as needed for mild constipation.    [provider]  ELIQUIS 5 MG TABS tablet TAKE 1 TABLET BY MOUTH TWICE A DAY 05/26/21   Birdie Sons, MD  FARXIGA 10 MG TABS tablet TAKE 1 TABLET BY MOUTH DAILY 02/09/22   Birdie Sons, MD  fluticasone furoate-vilanterol (BREO ELLIPTA) 100-25 MCG/INH AEPB INHALE 1 PUFF BY MOUTH INTO THE LUNGS DAILY. 07/18/21   Birdie Sons, MD  glucose blood (ACCU-CHEK GUIDE) test strip Use as instructed to check sugar daily for type 2 diabetes. 03/03/21   Birdie Sons, MD  isosorbide mononitrate (IMDUR) 120 MG 24 hr tablet TAKE 1 TABLET BY MOUTH DAILY 08/20/21   Birdie Sons, MD  linaclotide [facility]) 72 MCG capsule Take 72 mcg by mouth daily before breakfast.    [provider]  lisinopril (ZESTRIL) 10 MG tablet TAKE 1 TABLET BY MOUTH DAILY 09/17/21   Birdie Sons, MD  magnesium oxide (MAG-OX) 400 MG tablet Take 1 tablet by mouth daily. 03/23/17   [provider]  metFORMIN (GLUCOPHAGE-XR) 500 MG 24 hr tablet TAKE 1 TABLET BY MOUTH TWICE A DAY 08/28/21   Birdie Sons, MD  methocarbamol (ROBAXIN) 500 MG tablet TAKE 1 TABLET BY MOUTH EVERY 8 HOURS AS NEEDED FOR MUSCLE SPASMS 03/02/22   Birdie Sons, MD  naloxone [facility]) nasal spray 4 mg/0.1 mL 1 spray into nostril x1 and may repeat every 2-3 minutes until patient is responsive or EMS arrives 08/21/20   Birdie Sons, MD  nitroGLYCERIN (NITROSTAT) 0.4 MG SL tablet PLACE ONE TABLET UNDER THE TONGUE EVERY 5 MINUTES FOR CHEST PAIN. IF NO RELIEF PAST 3RD TAB GO TO EMERGENCY ROOM 12/02/21   Birdie Sons, MD  omega-3 acid ethyl esters (LOVAZA) 1 g capsule TAKE 4 CAPSULES BY MOUTH DAILY 08/20/21   Birdie Sons, MD  omeprazole (PRILOSEC) 40 MG capsule TAKE 1 CAPSULE BY MOUTH 2 TIMES DAILY 30 MINUTES BEFORE BREAKFAST AND DINNER 07/15/22   Birdie Sons, MD  ondansetron (ZOFRAN-ODT) 4 MG disintegrating tablet Take 1 tablet (4  mg total) by mouth every 8 (eight) hours as needed for nausea or vomiting. 04/08/22  Birdie Sons, MD  oxyCODONE-acetaminophen (PERCOCET) 10-325 MG tablet TAKE 1 TABLET BY MOUTH EVERY 4 HOURS AS NEEDED FOR PAIN 06/17/22   Birdie Sons, MD  ranolazine (RANEXA) 1000 MG SR tablet Take 1 tablet (1,000 mg total) by mouth 2 (two) times daily. 04/02/21   Birdie Sons, MD  SPIRIVA HANDIHALER 18 MCG inhalation capsule PLACE 1 CAPSULE INTO INHALER AND INHALE BY MOUTH ONCE A DAY. 06/02/22   Birdie Sons, MD  torsemide (DEMADEX) 100 MG tablet Take 0.5 tablets (50 mg total) by mouth daily. 05/13/20   Birdie Sons, MD  traZODone (DESYREL) 150 MG tablet TAKE 1 TABLET BY MOUTH AT BEDTIME 09/01/21   Birdie Sons, MD  venlafaxine XR (EFFEXOR-XR) 75 MG 24 hr capsule TAKE 1 CAPSULE BY MOUTH DAILY WITH BREAKFAST 12/16/21   Birdie Sons, MD    Physical Exam   Triage Vital Signs: ED Triage Vitals  Enc Vitals Group     BP 07/15/22 1829 (!) 175/112     Pulse Rate 07/15/22 1829 78     Resp 07/15/22 1829 20     Temp 07/15/22 1829 98.4 F (36.9 C)     Temp Source 07/15/22 1829 Oral     SpO2 07/15/22 1829 95 %     Weight 07/15/22 1825 181 lb (82.1 kg)     Height 07/15/22 1825 5' 7"  (1.702 m)     Head Circumference --      Peak Flow --      Pain Score 07/15/22 1824 7     Pain Loc --      Pain Edu? --      Excl. in [place]? --     Most recent vital signs: Vitals:   07/16/22 0138 07/16/22 0230  BP: (!) 154/93 (!) 162/99  Pulse: 79 62  Resp: 20 13  Temp:    SpO2: 96% 94%    CONSTITUTIONAL: Alert and oriented and responds appropriately to questions.  Chronically ill-appearing HEAD: Normocephalic, atraumatic EYES: Conjunctivae clear, pupils appear equal, sclera nonicteric ENT: normal nose; moist mucous membranes; TMs show no erythema, purulence, bulging, perforation.  Patient does have chronic appearing effusion of the right ear.  No cerumen impaction or sign of foreign body in the external  auditory canal. No inflammation, erythema or drainage from the external auditory canal. No signs of mastoiditis. No pain with manipulation of the pinna bilaterally. NECK: Supple, normal ROM CARD: Irregularly irregular and rate controlled; S1 and S2 appreciated; no murmurs, no clicks, no rubs, no gallops RESP: Normal chest excursion without splinting or tachypnea; breath sounds clear and equal bilaterally; no wheezes, no rhonchi, no rales, no hypoxia or respiratory distress, speaking full sentences ABD/GI: Normal bowel sounds; non-distended; soft, non-tender, no rebound, no guarding, no peritoneal signs BACK: The back appears normal EXT: Normal ROM in all joints; no deformity noted, no edema; no cyanosis, no calf tenderness or calf swelling SKIN: Normal color for age and race; warm; no rash on exposed skin NEURO: Moves all extremities equally, normal speech, normal sensation diffusely, no drift, patient has dysmetria to finger-nose testing bilaterally PSYCH: The patient's mood and manner are appropriate.   ED Results / Procedures / Treatments   LABS: (all labs ordered are listed, but only abnormal results are displayed) Labs Reviewed  BASIC METABOLIC PANEL - Abnormal; Notable for the following components:      Result Value   Glucose, Bld 140 (*)    All other components within normal limits  CBC - Abnormal; Notable for the following components:   RBC 5.83 (*)    Hemoglobin 17.5 (*)    HCT 53.2 (*)    All other components within normal limits  URINALYSIS, ROUTINE W REFLEX MICROSCOPIC - Abnormal; Notable for the following components:   APPearance HAZY (*)    Glucose, UA >500 (*)    Protein, ur 30 (*)    All other components within normal limits  BRAIN NATRIURETIC PEPTIDE  TROPONIN I (HIGH SENSITIVITY)  TROPONIN I (HIGH SENSITIVITY)     EKG:  EKG Interpretation  Date/Time:  Wednesday July 15 2022 18:33:33 EDT Ventricular Rate:  76 PR Interval:    QRS Duration: 98 QT  Interval:  414 QTC Calculation: 465 R Axis:   25 Text Interpretation: Atrial fibrillation Inferior infarct , age undetermined Anterior infarct , age undetermined Abnormal ECG When compared with ECG of 07-Jul-2022 23:06, PREVIOUS ECG IS PRESENT Confirmed by Pryor Curia [phone]) on 07/16/2022 12:33:39 AM         RADIOLOGY: My personal review and interpretation of imaging: MRI brain shows no stroke.  Chest x-ray clear.  I have personally reviewed all radiology reports.   MR BRAIN WO CONTRAST  Result Date: 07/16/2022 CLINICAL DATA:  Initial evaluation for neuro deficit, stroke suspected, vertigo. EXAM: MRI HEAD WITHOUT CONTRAST TECHNIQUE: Multiplanar, multiecho pulse sequences of the brain and surrounding structures were obtained without intravenous contrast. COMPARISON:  Prior CT from 07/15/2022. FINDINGS: Brain: Cerebral volume within normal limits for age. Mild patchy T2/FLAIR hyperintensity involving the periventricular and deep white matter both cerebral hemispheres, most consistent with chronic small vessel ischemic disease, mild for age. No evidence for acute or subacute ischemia. Gray-white matter differentiation maintained. No areas of chronic cortical infarction. No acute or chronic intracranial blood products. No mass lesion, midline shift or mass effect no hydrocephalus or extra-axial fluid collection. Pituitary gland and suprasellar region within normal limits. Vascular: Major intracranial vascular flow voids are maintained. Skull and upper cervical spine: Craniocervical junction within normal limits. Bone marrow signal intensity normal. No scalp soft tissue abnormality. Sinuses/Orbits: Globes and orbital soft tissues demonstrate no acute finding. Scattered mucosal thickening noted throughout the ethmoidal air cells and maxillary sinuses. Trace left mastoid effusion, of doubtful significance. Other: None. IMPRESSION: 1. No acute intracranial infarct or other abnormality. 2. Mild chronic  microvascular ischemic disease for age. Electronically Signed   By: Jeannine Boga M.D.   On: 07/16/2022 02:24   DG Chest 2 View  Result Date: 07/15/2022 CLINICAL DATA:  Weakness EXAM: CHEST - 2 VIEW COMPARISON:  Chest x-ray 07/07/2022 FINDINGS: Median sternotomy wires and mediastinal clips are present. The heart size and mediastinal contours are within normal limits. Both lungs are clear. The visualized skeletal structures are unremarkable. IMPRESSION: No active cardiopulmonary disease. Electronically Signed   By: Ronney Asters M.D.   On: 07/15/2022 19:12   CT HEAD WO CONTRAST (5MM)  Result Date: 07/15/2022 CLINICAL DATA:  Ataxia.  Coordination difficulty.  Nausea vomiting. EXAM: CT HEAD WITHOUT CONTRAST TECHNIQUE: Contiguous axial images were obtained from the base of the skull through the vertex without intravenous contrast. RADIATION DOSE REDUCTION: This exam was performed according to the departmental dose-optimization program which includes automated exposure control, adjustment of the mA and/or kV according to patient size and/or use of iterative reconstruction technique. COMPARISON:  12/11/2020. FINDINGS: Brain: No evidence of acute infarction, hemorrhage, hydrocephalus, extra-axial collection or mass lesion/mass effect. Vascular: No hyperdense vessel or unexpected calcification. Skull: Normal. Negative for fracture or  focal lesion. Sinuses/Orbits: Globes and orbits are unremarkable. Mild left maxillary sinus mucosal thickening with a small amount of dependent fluid. Other: None. IMPRESSION: 1. No acute intracranial abnormalities. 2. Mild left maxillary sinus mucosal thickening with a small amount of dependent fluid. Electronically Signed   By: Lajean Manes M.D.   On: 07/15/2022 14:25     PROCEDURES:  Critical Care performed: No   .1-3 Lead EKG Interpretation  Performed by: Jefferson Fullam, Delice Bison, DO Authorized by: Ameya Kutz, Delice Bison, DO     Interpretation: abnormal     ECG rate:  74   ECG  rate assessment: normal     Rhythm: atrial fibrillation     Ectopy: none     Conduction: normal       IMPRESSION / MDM / ASSESSMENT AND PLAN / ED COURSE  I reviewed the triage vital signs and the nursing notes.    Patient here with complaints of hypertension, chest tightness, vertigo.  The patient is on the cardiac monitor to evaluate for evidence of arrhythmia and/or significant heart rate changes.   DIFFERENTIAL DIAGNOSIS (includes but not limited to):   Intracranial hemorrhage, CVA, BPPV, Mnire's, no sign of otitis media.  Chest tightness also may be secondary to hypertensive urgency, less likely emergency.  ACS, PE, dissection also on the differential.   Patient's presentation is most consistent with acute presentation with potential threat to life or bodily function.   PLAN: CBC, BMP, troponin x2, chest x-ray obtained from triage.  He had a head CT read by his primary care doctor today that was reviewed and interpreted by myself and showed no significant abnormality other than some sinus disease.  He denies any cough other than his chronic cough from previous tobacco use, congestion, fever.  No sinus pressure on exam.  Clinically I do not think that he has any bacterial sinusitis.  He has no anemia, leukocytosis.  Normal electrolytes and glucose.  Troponin x2 negative.  Chest x-ray reviewed and interpreted by myself and the radiologist and shows no acute abnormality.  We will add on BNP given his history of CHF but no signs of volume overload clinically.  Will obtain MRI of the brain to rule out CVA.  He is outside of TNK window given symptoms started this morning when he woke up.  Will give meclizine.  Offered nitroglycerin for his chest tightness which he declines.  Repeat blood pressure has already improved without intervention to 120s/90s.  We will continue to monitor closely.   MEDICATIONS GIVEN IN ED: Medications  nitroGLYCERIN (NITROSTAT) SL tablet 0.4 mg (0.4 mg  Sublingual Given 07/16/22 0218)  aspirin chewable tablet 324 mg (324 mg Oral Given 07/16/22 0111)  meclizine (ANTIVERT) tablet 25 mg (25 mg Oral Given 07/16/22 0111)  acetaminophen (TYLENOL) tablet 1,000 mg (1,000 mg Oral Given 07/16/22 0112)     ED COURSE: Urine does not appear infected.  MRI brain report pending.  Patient ambulating without difficulty here.   MRI brain reviewed and interpreted by myself and radiologist and shows no infarct or other acute abnormality.  Vertigo likely coming from his inner ear.  His blood pressures have improved without any intervention and given his medications were just adjusted a week ago, discussed with him that I recommend follow-up with his cardiologist and PCP prior to me adjusting anything from the ED.  States he continues to have chest discomfort but denies that it is exertional, pleuritic and does not improve with nitroglycerin.  It seems atypical in nature  and his EKG shows no new ischemic changes and his troponin x2 are negative.  BNP is also normal.  Low suspicion for ACS.  States that this chest pain is chronic for him and he is being followed by his cardiologist for this.  He also has no tachypnea, tachycardia or hypoxia.  Low suspicion for PE.  He is also extremely well-appearing and does not complain of pain that radiates into the abdomen or back, low suspicion for dissection.  Patient is already on isosorbide and Ranexa.  His last cardiac catheterization was in June 2022.  He states he just saw his cardiologist this week and saw his PCP today.  Recommended that he contact both for close follow-up.   CONSULTS: Admission considered but given work-up is reassuring, I feel patient is appropriate for further outpatient management.  He is comfortable with this plan as well and will call his PCP and cardiologist for close follow-up.   OUTSIDE RECORDS REVIEWED: Reviewed patient's outpatient head CT today.  Reviewed his last cardiology note in July  2023.   Cath 05/01/21:  Prox LAD to Mid LAD lesion is 30% stenosed. Mid LAD lesion is 40% stenosed. Non-stenotic Mid LAD to Dist LAD lesion was previously treated. 1st Mrg lesion is 70% stenosed. Non-stenotic Prox Cx to Mid Cx lesion was previously treated. Prox RCA lesion is 15% stenosed. Dist RCA lesion is 25% stenosed. Non-stenotic Origin to Prox Graft lesion was previously treated. LIMA and is small. RPDA-1 lesion is 90% stenosed. RPDA-2 lesion is 85% stenosed. RPAV-1 lesion is 85% stenosed with 85% stenosed side branch in 1st RPL. RPAV-2 lesion is 60% stenosed.   Echo 04/28/2021:     1. Left ventricular ejection fraction, by estimation, is 45 to 50%. The  left ventricle has mildly decreased function. The left ventricle has no  regional wall motion abnormalities. Left ventricular diastolic parameters  were normal.   2. Right ventricular systolic function is normal. The right ventricular  size is normal.   3. The mitral valve is normal in structure. Trivial mitral valve  regurgitation.   4. The aortic valve is normal in structure. Aortic valve regurgitation is  trivial.    FINAL CLINICAL IMPRESSION(S) / ED DIAGNOSES   Final diagnoses:  Vertigo  Nonspecific chest pain  Hypertension, unspecified type     Rx / DC Orders   ED Discharge Orders          Ordered    meclizine (ANTIVERT) 25 MG tablet  3 times daily PRN        07/16/22 0231             Note:  This document was prepared using Dragon voice recognition software and may include unintentional dictation errors.   Zonia Caplin, Delice Bison, DO 07/16/22 [place], Delice Bison, DO 07/16/22 [id number]

## 2022-07-17 NOTE — Telephone Encounter (Signed)
Requested medication (s) are due for refill today: no  Requested medication (s) are on the active medication list: yes  Last refill:  07/16/22  Future visit scheduled: yes  Notes to clinic:  Unable to refill per protocol, last refill by provider 4/33/29, possible duplicate request.     Requested Prescriptions  Pending Prescriptions Disp Refills   oxyCODONE-acetaminophen (PERCOCET) 10-325 MG tablet 180 tablet 0    Sig: Take 1 tablet by mouth every 4 (four) hours as needed. for pain     Not Delegated - Analgesics:  Opioid Agonist Combinations Failed - 07/16/2022  9:43 AM      Failed - This refill cannot be delegated      Failed - Urine Drug Screen completed in last 360 days      Passed - Valid encounter within last 3 months    Recent Outpatient Visits           2 days ago [place], Donald E, MD   1 month ago Toe pain, left   [facility] Tally Joe T, FNP   3 months ago Type 2 diabetes mellitus with diabetic nephropathy, without long-term current use of insulin [facility])   [facility] Bryan Birdie Sons, MD   6 months ago OSA (obstructive sleep apnea)   [place], DO   1 year ago Type 2 diabetes mellitus with diabetic nephropathy, without long-term current use of insulin [facility])   [facility] Birdie Sons, MD       Future Appointments             In 2 weeks [name], [name], MD [facility], [place]

## 2022-07-28 ENCOUNTER — Ambulatory Visit: Payer: Self-pay

## 2022-07-28 NOTE — Patient Outreach (Signed)
  Care Coordination   07/28/2022 Name: BJ MORLOCK MRN: [id number] DOB: 1956/06/03   Care Coordination Outreach Attempts:  An unsuccessful telephone outreach was attempted for a scheduled appointment today.  Follow Up Plan:  Additional outreach attempts will be made to offer the patient care coordination information and services.   Encounter Outcome:  No Answer  Care Coordination Interventions Activated:  No   Care Coordination Interventions:  No, not indicated    [facility] [phone]

## 2022-07-29 ENCOUNTER — Other Ambulatory Visit: Payer: Self-pay | Admitting: Family Medicine

## 2022-07-29 DIAGNOSIS — I10 Essential (primary) hypertension: Secondary | ICD-10-CM

## 2022-07-29 NOTE — Congregational Nurse Program (Signed)
  Dept: [facility] Nurse Program Note  Date of Encounter: 07/29/2022  Clinic visit for bleeding open are right forearm. Hit small scabbed area while doing laundry.  Cleaned with alcohol after wiping off bright red blood on forearm, applied folded 2 x 2 to area.  Resident has several small bruised areas both arms, states he is taking blood thinner due to previous cardiovascular surgery.  BP 138/82, pulse 90 and regular, O2 Sat 95%.  Cautioned regarding preventing injury and getting areas infected. Past Medical History: Past Medical History:  Diagnosis Date   A-fib ([place])    Anemia    Anginal pain ([place])    Anxiety    Arthritis    Asthma    CAD (coronary artery disease)    a. 2002 CABGx2 (LIMA->LAD, VG->VG->OM1);  b. 09/2012 DES->OM;  c. 03/2015 PTCA of LAD [facility]) in setting of atretic LIMA; d. 05/2015 Cath [facility]): nonobs dzs; e. 06/2015 Cath ([facility]): LM nl, LAD 45p/d ISR, 50d, D1/2 small, LCX 50p/d ISR, OM1 70ost, 30 ISR, VG->OM1 50ost, 53m LIMA->LAD 99p/d - atretic, RCA dom, nl; f.cath 10/16: 40-50%(FFR 0.90) pLAD, 75% (FFR 0.77) mLAD s/p PCI/DES, oRCA 40% (FFR0.95)   Cancer (HCC)    SKIN CANCER ON BACK   Celiac disease    Chronic diastolic CHF (congestive heart failure) ([facility]    a. 06/2009 Echo: EF 60-65%, Gr 1 DD, triv AI, mildly dil LA, nl RV.   COPD (chronic obstructive pulmonary disease) ([place]    a. Chronic bronchitis and emphysema.   DDD (degenerative disc disease), lumbar    Diverticulosis    Dysrhythmia    Essential hypertension    GERD (gastroesophageal reflux disease)    History of hiatal hernia    History of kidney stones    H/O   History of tobacco abuse    a. Quit 2014.   Myocardial infarction ([facility] 2002   4 STENTS   Pancreatitis    PSVT (paroxysmal supraventricular tachycardia) ([place]    a. 10/2012 Noted on [facility].   Sleep apnea    LOST CORD TO CPAP -ONLY 02 @ BEDTIME   Tubular adenoma of colon    Type II diabetes mellitus ([place]      Encounter Details:  CNP Questionnaire - 07/29/22 1500       Questionnaire   Do you give verbal consent to treat you today? Yes    [facility]    Patient Status Unknown    Insurance Medicare    Insurance Referral N/A    Medication N/A    Medical Provider Yes    Screening Referrals N/A    Medical Referral N/A    Medical Appointment Made N/A    Food N/A    Housing/Utilities N/A    Intervention Advocate;Blood glucose;Counsel;Support    ED Visit Averted N/A    Life-Saving Intervention Made N/A

## 2022-08-04 ENCOUNTER — Ambulatory Visit: Payer: Medicare HMO | Admitting: Family Medicine

## 2022-08-04 NOTE — Progress Notes (Deleted)
Established patient visit   Patient: Lawrence [name]   DOB: 05-19-1956   66 y.o. Male  MRN: [id number] Visit Date: 08/04/2022  Today's healthcare provider: [name], MD   No chief complaint on file.  Subjective    HPI  Follow up ER visit  Patient was seen in ER for vertigo, non specific chest pain and hypertension on 07/15/2022. Treatment for this included -see notes in chart. Patient was advised to follow up with cardiology and PCP. He reports {DESC; EXCELLENT/GOOD/FAIR:19992} compliance with treatment. He reports this condition is {improved/worse/unchanged:3041574}.  -----------------------------------------------------------------------------------------   Diabetes Mellitus Type II, Follow-up  Lab Results  Component Value Date   HGBA1C 7.2 (A) 04/03/2022   HGBA1C 8.4 (H) 12/23/2021   HGBA1C 7.4 (H) 04/27/2021   Wt Readings from Last 3 Encounters:  07/15/22 181 lb (82.1 kg)  07/15/22 184 lb 8 oz (83.7 kg)  07/07/22 190 lb (86.2 kg)   Last seen for diabetes 4 months ago.  Management since then includes continue same medication. He reports {excellent/good/fair/poor:19665} compliance with treatment. He {is/is not:21021397} having side effects. {document side effects if present:1} Symptoms: {Yes/No:20286} fatigue {Yes/No:20286} foot ulcerations  {Yes/No:20286} appetite changes {Yes/No:20286} nausea  {Yes/No:20286} paresthesia of the feet  {Yes/No:20286} polydipsia  {Yes/No:20286} polyuria {Yes/No:20286} visual disturbances   {Yes/No:20286} vomiting     Home blood sugar records: {diabetes glucometry results:16657}  Episodes of hypoglycemia? {Yes/No:20286} {enter symptoms and frequency of symptoms if yes:1}   Current insulin regiment:none Most Recent Eye Exam: not UTD {Current exercise:16438:::1} {Current diet habits:16563:::1}  Pertinent Labs: Lab Results  Component Value Date   CHOL 117 12/23/2021   HDL 29 (L) 12/23/2021   LDLCALC 63 12/23/2021    LDLDIRECT 122.1 (H) 09/27/2020   TRIG 144 12/23/2021   CHOLHDL 4.0 12/23/2021   Lab Results  Component Value Date   NA 139 07/15/2022   K 3.7 07/15/2022   CREATININE 0.72 07/15/2022   GFRNONAA >60 07/15/2022   MICROALBUR 50 10/28/2018   LABMICR See below: 05/25/2019     ---------------------------------------------------------------------------------------------------  Hypertension, follow-up  BP Readings from Last 3 Encounters:  07/29/22 138/82  07/16/22 (!) 153/106  07/15/22 (!) 159/108   Wt Readings from Last 3 Encounters:  07/15/22 181 lb (82.1 kg)  07/15/22 184 lb 8 oz (83.7 kg)  07/07/22 190 lb (86.2 kg)     He was last seen for hypertension 4 months ago.    He reports {excellent/good/fair/poor:19665} compliance with treatment. He {is/is not:9024} having side effects. {document side effects if present:1} He is following a {diet:21022986} diet. He {is/is not:9024} exercising. He {does/does not:200015} smoke.  Use of agents associated with hypertension: {bp agents assoc with hypertension:511::"none"}.   Outside blood pressures are {***enter patient reported home BP readings, or 'not being checked':1}. Symptoms: {Yes/No:20286} chest pain {Yes/No:20286} chest pressure  {Yes/No:20286} palpitations {Yes/No:20286} syncope  {Yes/No:20286} dyspnea {Yes/No:20286} orthopnea  {Yes/No:20286} paroxysmal nocturnal dyspnea {Yes/No:20286} lower extremity edema   Pertinent labs Lab Results  Component Value Date   CHOL 117 12/23/2021   HDL 29 (L) 12/23/2021   LDLCALC 63 12/23/2021   LDLDIRECT 122.1 (H) 09/27/2020   TRIG 144 12/23/2021   CHOLHDL 4.0 12/23/2021   Lab Results  Component Value Date   NA 139 07/15/2022   K 3.7 07/15/2022   CREATININE 0.72 07/15/2022   GFRNONAA >60 07/15/2022   GLUCOSE 140 (H) 07/15/2022   TSH 2.065 04/27/2021     The ASCVD Risk score (Arnett DK, et al., 2019) failed to calculate  for the following reasons:   The patient has a prior MI  or stroke diagnosis  ---------------------------------------------------------------------------------------------------   Medications: Outpatient Medications Prior to Visit  Medication Sig   Accu-Chek Softclix Lancets lancets Use as instructed to check sugar daily for type 2 diabetes.   albuterol (VENTOLIN HFA) 108 (90 Base) MCG/ACT inhaler INHALE 2 PUFFS BY MOUTH EVERY 6 HOURS AS NEEDED FOR SHORTNESS OF BREATH   allopurinol (ZYLOPRIM) 300 MG tablet TAKE 1 TABLET BY MOUTH TWICE A DAY   ALPRAZolam (XANAX) 1 MG tablet Take 1 tablet (1 mg total) by mouth 3 (three) times daily.   amiodarone (PACERONE) 200 MG tablet Take 200 mg by mouth 2 (two) times daily.   aspirin 81 MG chewable tablet Chew 81 mg by mouth daily. Swallows whole   atorvastatin (LIPITOR) 80 MG tablet TAKE 1 TABLET BY MOUTH AT BEDTIME   BELSOMRA 5 MG TABS TAKE 1 TABLET (5 MG TOTAL) BY MOUTH AT BEDTIME AS NEEDED   Blood Glucose Calibration (ACCU-CHEK GUIDE CONTROL) LIQD Use with blood glucose monitor as directed   Blood Glucose Monitoring Suppl (ACCU-CHEK GUIDE) w/Device KIT Use to check blood sugars as directed   celecoxib (CELEBREX) 200 MG capsule Take 1 capsule by mouth 2 (two) times daily.   cetirizine (ZYRTEC) 10 MG tablet TAKE 1 TABLET BY MOUTH AT BEDTIME   diltiazem (CARDIZEM CD) 180 MG 24 hr capsule Take 1 capsule (180 mg total) by mouth daily.   docusate sodium (COLACE) 100 MG capsule Take 100 mg by mouth daily as needed for mild constipation.   ELIQUIS 5 MG TABS tablet TAKE 1 TABLET BY MOUTH TWICE A DAY   FARXIGA 10 MG TABS tablet TAKE 1 TABLET BY MOUTH DAILY   fluticasone furoate-vilanterol (BREO ELLIPTA) 100-25 MCG/INH AEPB INHALE 1 PUFF BY MOUTH INTO THE LUNGS DAILY.   glucose blood (ACCU-CHEK GUIDE) test strip Use as instructed to check sugar daily for type 2 diabetes.   isosorbide mononitrate (IMDUR) 120 MG 24 hr tablet TAKE 1 TABLET BY MOUTH DAILY   linaclotide (LINZESS) 72 MCG capsule Take 72 mcg by mouth daily  before breakfast.   lisinopril (ZESTRIL) 10 MG tablet TAKE 1 TABLET BY MOUTH DAILY   magnesium oxide (MAG-OX) 400 MG tablet Take 1 tablet by mouth daily.   meclizine (ANTIVERT) 25 MG tablet Take 1 tablet (25 mg total) by mouth 3 (three) times daily as needed for dizziness.   metFORMIN (GLUCOPHAGE-XR) 500 MG 24 hr tablet TAKE 1 TABLET BY MOUTH TWICE A DAY   methocarbamol (ROBAXIN) 500 MG tablet TAKE 1 TABLET BY MOUTH EVERY 8 HOURS AS NEEDED FOR MUSCLE SPASMS   naloxone (NARCAN) nasal spray 4 mg/0.1 mL 1 spray into nostril x1 and may repeat every 2-3 minutes until patient is responsive or EMS arrives   nitroGLYCERIN (NITROSTAT) 0.4 MG SL tablet PLACE ONE TABLET UNDER THE TONGUE EVERY 5 MINUTES FOR CHEST PAIN. IF NO RELIEF PAST 3RD TAB GO TO EMERGENCY ROOM   omega-3 acid ethyl esters (LOVAZA) 1 g capsule TAKE 4 CAPSULES BY MOUTH DAILY   omeprazole (PRILOSEC) 40 MG capsule TAKE 1 CAPSULE BY MOUTH 2 TIMES DAILY 30 MINUTES BEFORE BREAKFAST AND DINNER   ondansetron (ZOFRAN-ODT) 4 MG disintegrating tablet Take 1 tablet (4 mg total) by mouth every 8 (eight) hours as needed for nausea or vomiting.   oxyCODONE-acetaminophen (PERCOCET) 10-325 MG tablet TAKE 1 TABLET BY MOUTH EVERY 4 HOURS AS NEEDED FOR PAIN   ranolazine (RANEXA) 1000 MG SR tablet Take  1 tablet (1,000 mg total) by mouth 2 (two) times daily.   SPIRIVA HANDIHALER 18 MCG inhalation capsule PLACE 1 CAPSULE INTO INHALER AND INHALE BY MOUTH ONCE A DAY.   torsemide (DEMADEX) 100 MG tablet Take 0.5 tablets (50 mg total) by mouth daily.   traZODone (DESYREL) 150 MG tablet TAKE 1 TABLET BY MOUTH AT BEDTIME   venlafaxine XR (EFFEXOR-XR) 75 MG 24 hr capsule TAKE 1 CAPSULE BY MOUTH DAILY WITH BREAKFAST   No facility-administered medications prior to visit.    Review of Systems  {Labs  Heme  Chem  Endocrine  Serology  Results Review (optional):23779}   Objective    There were no vitals taken for this visit. {Show previous vital signs  (optional):23777}  Physical Exam  ***  No results found for any visits on 08/04/22.  Assessment & Plan     ***  No follow-ups on file.      {provider attestation***:1}   [name], MD  [facility] [phone] (phone) [phone] (fax)  [place]

## 2022-08-05 ENCOUNTER — Telehealth: Payer: Self-pay | Admitting: Family Medicine

## 2022-08-05 NOTE — Telephone Encounter (Signed)
Pt states pharmacy sent over a PA request  Pt does not know the name of the medication that requires a PA  Please assist further

## 2022-08-07 ENCOUNTER — Encounter: Payer: Self-pay | Admitting: Emergency Medicine

## 2022-08-07 ENCOUNTER — Emergency Department: Payer: Medicare HMO

## 2022-08-07 ENCOUNTER — Other Ambulatory Visit: Payer: Self-pay

## 2022-08-07 ENCOUNTER — Observation Stay
Admission: EM | Admit: 2022-08-07 | Discharge: 2022-08-07 | Disposition: A | Discharge status: Home / Self Care | Payer: Medicare HMO | Attending: Internal Medicine | Admitting: Internal Medicine

## 2022-08-07 DIAGNOSIS — I482 Chronic atrial fibrillation, unspecified: Secondary | ICD-10-CM | POA: Diagnosis not present

## 2022-08-07 DIAGNOSIS — R079 Chest pain, unspecified: Secondary | ICD-10-CM | POA: Diagnosis not present

## 2022-08-07 DIAGNOSIS — I2510 Atherosclerotic heart disease of native coronary artery without angina pectoris: Secondary | ICD-10-CM | POA: Diagnosis present

## 2022-08-07 DIAGNOSIS — I10 Essential (primary) hypertension: Secondary | ICD-10-CM | POA: Diagnosis not present

## 2022-08-07 DIAGNOSIS — G894 Chronic pain syndrome: Secondary | ICD-10-CM | POA: Insufficient documentation

## 2022-08-07 DIAGNOSIS — J45909 Unspecified asthma, uncomplicated: Secondary | ICD-10-CM | POA: Diagnosis not present

## 2022-08-07 DIAGNOSIS — Z951 Presence of aortocoronary bypass graft: Secondary | ICD-10-CM | POA: Diagnosis not present

## 2022-08-07 DIAGNOSIS — I5032 Chronic diastolic (congestive) heart failure: Secondary | ICD-10-CM | POA: Insufficient documentation

## 2022-08-07 DIAGNOSIS — I110 Hypertensive heart disease with heart failure: Secondary | ICD-10-CM | POA: Diagnosis not present

## 2022-08-07 DIAGNOSIS — Z79899 Other long term (current) drug therapy: Secondary | ICD-10-CM | POA: Insufficient documentation

## 2022-08-07 DIAGNOSIS — E119 Type 2 diabetes mellitus without complications: Secondary | ICD-10-CM | POA: Insufficient documentation

## 2022-08-07 DIAGNOSIS — I5022 Chronic systolic (congestive) heart failure: Secondary | ICD-10-CM | POA: Diagnosis present

## 2022-08-07 DIAGNOSIS — J449 Chronic obstructive pulmonary disease, unspecified: Secondary | ICD-10-CM | POA: Diagnosis not present

## 2022-08-07 DIAGNOSIS — Z85038 Personal history of other malignant neoplasm of large intestine: Secondary | ICD-10-CM | POA: Diagnosis not present

## 2022-08-07 DIAGNOSIS — R0789 Other chest pain: Secondary | ICD-10-CM

## 2022-08-07 DIAGNOSIS — Z7984 Long term (current) use of oral hypoglycemic drugs: Secondary | ICD-10-CM | POA: Diagnosis not present

## 2022-08-07 DIAGNOSIS — Z95818 Presence of other cardiac implants and grafts: Secondary | ICD-10-CM | POA: Insufficient documentation

## 2022-08-07 DIAGNOSIS — I5042 Chronic combined systolic (congestive) and diastolic (congestive) heart failure: Secondary | ICD-10-CM | POA: Diagnosis present

## 2022-08-07 DIAGNOSIS — I4891 Unspecified atrial fibrillation: Secondary | ICD-10-CM | POA: Diagnosis not present

## 2022-08-07 DIAGNOSIS — Z87891 Personal history of nicotine dependence: Secondary | ICD-10-CM | POA: Insufficient documentation

## 2022-08-07 DIAGNOSIS — R072 Precordial pain: Secondary | ICD-10-CM | POA: Diagnosis not present

## 2022-08-07 DIAGNOSIS — Z85828 Personal history of other malignant neoplasm of skin: Secondary | ICD-10-CM | POA: Insufficient documentation

## 2022-08-07 DIAGNOSIS — E1121 Type 2 diabetes mellitus with diabetic nephropathy: Secondary | ICD-10-CM

## 2022-08-07 DIAGNOSIS — Z7982 Long term (current) use of aspirin: Secondary | ICD-10-CM | POA: Diagnosis not present

## 2022-08-07 DIAGNOSIS — R0782 Intercostal pain: Secondary | ICD-10-CM

## 2022-08-07 DIAGNOSIS — Z7901 Long term (current) use of anticoagulants: Secondary | ICD-10-CM | POA: Insufficient documentation

## 2022-08-07 DIAGNOSIS — I480 Paroxysmal atrial fibrillation: Secondary | ICD-10-CM

## 2022-08-07 LAB — CBC WITH DIFFERENTIAL/PLATELET
Abs Immature Granulocytes: 0.06 10*3/uL (ref 0.00–0.07)
Basophils Absolute: 0 10*3/uL (ref 0.0–0.1)
Basophils Relative: 1 %
Eosinophils Absolute: 0.2 10*3/uL (ref 0.0–0.5)
Eosinophils Relative: 3 %
HCT: 50.3 % (ref 39.0–52.0)
Hemoglobin: 15.8 g/dL (ref 13.0–17.0)
Immature Granulocytes: 1 %
Lymphocytes Relative: 27 %
Lymphs Abs: 2 10*3/uL (ref 0.7–4.0)
MCH: 30.1 pg (ref 26.0–34.0)
MCHC: 31.4 g/dL (ref 30.0–36.0)
MCV: 95.8 fL (ref 80.0–100.0)
Monocytes Absolute: 0.5 10*3/uL (ref 0.1–1.0)
Monocytes Relative: 7 %
Neutro Abs: 4.7 10*3/uL (ref 1.7–7.7)
Neutrophils Relative %: 61 %
Platelets: 190 10*3/uL (ref 150–400)
RBC: 5.25 MIL/uL (ref 4.22–5.81)
RDW: 14.9 % (ref 11.5–15.5)
WBC: 7.6 10*3/uL (ref 4.0–10.5)
nRBC: 0 % (ref 0.0–0.2)

## 2022-08-07 LAB — URINALYSIS, ROUTINE W REFLEX MICROSCOPIC
Bilirubin Urine: NEGATIVE
Glucose, UA: 100 mg/dL — AB
Ketones, ur: NEGATIVE mg/dL
Leukocytes,Ua: NEGATIVE
Nitrite: NEGATIVE
Protein, ur: NEGATIVE mg/dL
Specific Gravity, Urine: 1.025 (ref 1.005–1.030)
pH: 5.5 (ref 5.0–8.0)

## 2022-08-07 LAB — COMPREHENSIVE METABOLIC PANEL
ALT: 12 U/L (ref 0–44)
AST: 14 U/L — ABNORMAL LOW (ref 15–41)
Albumin: 3.6 g/dL (ref 3.5–5.0)
Alkaline Phosphatase: 76 U/L (ref 38–126)
Anion gap: 6 (ref 5–15)
BUN: 12 mg/dL (ref 8–23)
CO2: 31 mmol/L (ref 22–32)
Calcium: 9.1 mg/dL (ref 8.9–10.3)
Chloride: 106 mmol/L (ref 98–111)
Creatinine, Ser: 0.73 mg/dL (ref 0.61–1.24)
GFR, Estimated: >60 mL/min (ref >60)
Glucose, Bld: 154 mg/dL — ABNORMAL HIGH (ref 70–99)
Potassium: 4.3 mmol/L (ref 3.5–5.1)
Sodium: 143 mmol/L (ref 135–145)
Total Bilirubin: 0.7 mg/dL (ref 0.3–1.2)
Total Protein: 6.3 g/dL — ABNORMAL LOW (ref 6.5–8.1)

## 2022-08-07 LAB — URINALYSIS, MICROSCOPIC (REFLEX): Squamous Epithelial / HPF: NONE SEEN (ref 0–5)

## 2022-08-07 LAB — CBG MONITORING, ED
Glucose-Capillary: 131 mg/dL — ABNORMAL HIGH (ref 70–99)
Glucose-Capillary: 126 mg/dL — ABNORMAL HIGH (ref 70–99)

## 2022-08-07 LAB — TROPONIN I (HIGH SENSITIVITY)
Troponin I (High Sensitivity): 17 ng/L (ref <18)
Troponin I (High Sensitivity): 15 ng/L (ref <18)
Troponin I (High Sensitivity): 11 ng/L (ref <18)

## 2022-08-07 LAB — BRAIN NATRIURETIC PEPTIDE: B Natriuretic Peptide: 313.6 pg/mL — ABNORMAL HIGH (ref 0.0–100.0)

## 2022-08-07 MED ORDER — NALOXONE HCL 4 MG/0.1ML NA LIQD: 0.4000 mg | Freq: Once | NASAL | Status: DC | PRN

## 2022-08-07 MED ORDER — ACETAMINOPHEN 325 MG PO TABS: 650.0000 mg | ORAL_TABLET | ORAL | Status: DC | PRN

## 2022-08-07 MED ORDER — ENOXAPARIN SODIUM 40 MG/0.4ML IJ SOSY: 40.0000 mg | PREFILLED_SYRINGE | INTRAMUSCULAR | Status: DC

## 2022-08-07 MED ORDER — INSULIN ASPART 100 UNIT/ML IJ SOLN
0.0000 [IU] | Freq: Three times a day (TID) | INTRAMUSCULAR | Status: DC
  Administered 2022-08-07 (×2): 1 [IU] via SUBCUTANEOUS
  Filled 2022-08-07 (×2): qty 1

## 2022-08-07 MED ORDER — RANOLAZINE ER 500 MG PO TB12
1000.0000 mg | ORAL_TABLET | Freq: Two times a day (BID) | ORAL | Status: DC
  Administered 2022-08-07: 1000 mg via ORAL
  Filled 2022-08-07 (×2): qty 2

## 2022-08-07 MED ORDER — FENTANYL CITRATE PF 50 MCG/ML IJ SOSY
75.0000 ug | PREFILLED_SYRINGE | Freq: Once | INTRAMUSCULAR | Status: AC
  Administered 2022-08-07: 75 ug via INTRAVENOUS
  Filled 2022-08-07: qty 2

## 2022-08-07 MED ORDER — METHOCARBAMOL 500 MG PO TABS: 500.0000 mg | ORAL_TABLET | Freq: Three times a day (TID) | ORAL | Status: DC | PRN

## 2022-08-07 MED ORDER — ONDANSETRON 4 MG PO TBDP: 4.0000 mg | ORAL_TABLET | Freq: Three times a day (TID) | ORAL | Status: DC | PRN

## 2022-08-07 MED ORDER — OMEGA-3-ACID ETHYL ESTERS 1 G PO CAPS
4.0000 | ORAL_CAPSULE | Freq: Every day | ORAL | Status: DC
  Filled 2022-08-07: qty 4

## 2022-08-07 MED ORDER — ALBUTEROL SULFATE (2.5 MG/3ML) 0.083% IN NEBU: 2.5000 mg | INHALATION_SOLUTION | RESPIRATORY_TRACT | Status: DC

## 2022-08-07 MED ORDER — POTASSIUM CHLORIDE IN NACL 20-0.9 MEQ/L-% IV SOLN: INTRAVENOUS | Status: DC

## 2022-08-07 MED ORDER — NITROGLYCERIN 0.4 MG SL SUBL: 0.4000 mg | SUBLINGUAL_TABLET | SUBLINGUAL | 3 refills | Status: DC | PRN

## 2022-08-07 MED ORDER — ACETAMINOPHEN 325 MG PO TABS
650.0000 mg | ORAL_TABLET | ORAL | Status: DC | PRN
Start: 2022-08-07 — End: 2022-08-07

## 2022-08-07 MED ORDER — TIOTROPIUM BROMIDE MONOHYDRATE 18 MCG IN CAPS: 18.0000 ug | ORAL_CAPSULE | Freq: Every day | RESPIRATORY_TRACT | Status: DC

## 2022-08-07 MED ORDER — TORSEMIDE 20 MG PO TABS
50.0000 mg | ORAL_TABLET | Freq: Every day | ORAL | Status: DC
  Administered 2022-08-07: 50 mg via ORAL
  Filled 2022-08-07: qty 3

## 2022-08-07 MED ORDER — DOCUSATE SODIUM 100 MG PO CAPS: 100.0000 mg | ORAL_CAPSULE | Freq: Every day | ORAL | Status: DC | PRN

## 2022-08-07 MED ORDER — ISOSORBIDE MONONITRATE ER 60 MG PO TB24
120.0000 mg | ORAL_TABLET | Freq: Every day | ORAL | Status: DC
  Administered 2022-08-07: 120 mg via ORAL
  Filled 2022-08-07: qty 2

## 2022-08-07 MED ORDER — METHOCARBAMOL 500 MG PO TABS
500.0000 mg | ORAL_TABLET | Freq: Three times a day (TID) | ORAL | Status: DC
  Administered 2022-08-07: 500 mg via ORAL
  Filled 2022-08-07 (×2): qty 1

## 2022-08-07 MED ORDER — INSULIN ASPART 100 UNIT/ML IJ SOLN: 0.0000 [IU] | Freq: Every day | INTRAMUSCULAR | Status: DC

## 2022-08-07 MED ORDER — ACETAMINOPHEN 325 MG PO TABS: 325.0000 mg | ORAL_TABLET | ORAL | Status: DC | PRN

## 2022-08-07 MED ORDER — ALBUTEROL SULFATE (2.5 MG/3ML) 0.083% IN NEBU: 2.5000 mg | INHALATION_SOLUTION | RESPIRATORY_TRACT | Status: DC | PRN

## 2022-08-07 MED ORDER — NITROGLYCERIN 0.4 MG SL SUBL: 0.4000 mg | SUBLINGUAL_TABLET | SUBLINGUAL | Status: DC | PRN

## 2022-08-07 MED ORDER — OXYCODONE HCL 5 MG PO TABS: 10.0000 mg | ORAL_TABLET | ORAL | Status: DC | PRN

## 2022-08-07 MED ORDER — ALBUTEROL SULFATE HFA 108 (90 BASE) MCG/ACT IN AERS: 2.0000 | INHALATION_SPRAY | RESPIRATORY_TRACT | Status: DC | PRN

## 2022-08-07 MED ORDER — NITROGLYCERIN 0.4 MG SL SUBL
0.4000 mg | SUBLINGUAL_TABLET | SUBLINGUAL | Status: DC | PRN
Start: 2022-08-07 — End: 2022-08-07
  Administered 2022-08-07: 0.4 mg via SUBLINGUAL
  Filled 2022-08-07: qty 1

## 2022-08-07 MED ORDER — METOPROLOL TARTRATE 25 MG PO TABS: 25.0000 mg | ORAL_TABLET | Freq: Two times a day (BID) | ORAL | 0 refills | Status: DC

## 2022-08-07 MED ORDER — FENTANYL CITRATE PF 50 MCG/ML IJ SOSY
50.0000 ug | PREFILLED_SYRINGE | Freq: Once | INTRAMUSCULAR | Status: AC
  Administered 2022-08-07: 50 ug via INTRAVENOUS
  Filled 2022-08-07: qty 1

## 2022-08-07 MED ORDER — ATORVASTATIN CALCIUM 20 MG PO TABS: 80.0000 mg | ORAL_TABLET | Freq: Every day | ORAL | Status: DC

## 2022-08-07 MED ORDER — APIXABAN 5 MG PO TABS
5.0000 mg | ORAL_TABLET | Freq: Two times a day (BID) | ORAL | Status: DC
  Administered 2022-08-07: 5 mg via ORAL
  Filled 2022-08-07: qty 1

## 2022-08-07 MED ORDER — DILTIAZEM HCL ER COATED BEADS 180 MG PO CP24
180.0000 mg | ORAL_CAPSULE | Freq: Every day | ORAL | Status: DC
  Administered 2022-08-07: 180 mg via ORAL
  Filled 2022-08-07: qty 1

## 2022-08-07 MED ORDER — SODIUM CHLORIDE 0.9 % IV SOLN: INTRAVENOUS | Status: DC

## 2022-08-07 MED ORDER — PANTOPRAZOLE SODIUM 40 MG PO TBEC
40.0000 mg | DELAYED_RELEASE_TABLET | Freq: Every day | ORAL | Status: DC
  Administered 2022-08-07: 40 mg via ORAL
  Filled 2022-08-07: qty 1

## 2022-08-07 MED ORDER — LISINOPRIL 10 MG PO TABS
10.0000 mg | ORAL_TABLET | Freq: Every day | ORAL | Status: DC
  Administered 2022-08-07: 10 mg via ORAL
  Filled 2022-08-07: qty 1

## 2022-08-07 MED ORDER — LINACLOTIDE 72 MCG PO CAPS
72.0000 ug | ORAL_CAPSULE | Freq: Every day | ORAL | Status: DC
  Administered 2022-08-07: 72 ug via ORAL
  Filled 2022-08-07: qty 1

## 2022-08-07 MED ORDER — ALPRAZOLAM 0.5 MG PO TABS
1.0000 mg | ORAL_TABLET | Freq: Three times a day (TID) | ORAL | Status: DC
  Administered 2022-08-07: 1 mg via ORAL
  Filled 2022-08-07: qty 2

## 2022-08-07 MED ORDER — LORATADINE 10 MG PO TABS
10.0000 mg | ORAL_TABLET | Freq: Every day | ORAL | Status: DC
  Administered 2022-08-07: 10 mg via ORAL
  Filled 2022-08-07: qty 1

## 2022-08-07 MED ORDER — AMIODARONE HCL 200 MG PO TABS
200.0000 mg | ORAL_TABLET | Freq: Two times a day (BID) | ORAL | Status: DC
  Administered 2022-08-07: 200 mg via ORAL
  Filled 2022-08-07: qty 1

## 2022-08-07 MED ORDER — MAGNESIUM OXIDE 400 MG PO TABS
400.0000 mg | ORAL_TABLET | Freq: Every day | ORAL | Status: DC
  Administered 2022-08-07: 400 mg via ORAL
  Filled 2022-08-07 (×2): qty 1

## 2022-08-07 MED ORDER — VENLAFAXINE HCL ER 75 MG PO CP24
75.0000 mg | ORAL_CAPSULE | Freq: Every day | ORAL | Status: DC
  Administered 2022-08-07: 75 mg via ORAL
  Filled 2022-08-07: qty 1

## 2022-08-07 MED ORDER — CYCLOBENZAPRINE HCL 10 MG PO TABS: 10.0000 mg | ORAL_TABLET | Freq: Three times a day (TID) | ORAL | 0 refills | Status: AC | PRN

## 2022-08-07 MED ORDER — OXYCODONE-ACETAMINOPHEN 10-325 MG PO TABS: 1.0000 | ORAL_TABLET | ORAL | Status: DC | PRN

## 2022-08-07 MED ORDER — ONDANSETRON HCL 4 MG/2ML IJ SOLN: 4.0000 mg | Freq: Four times a day (QID) | INTRAMUSCULAR | Status: DC | PRN

## 2022-08-07 MED ORDER — MAGNESIUM HYDROXIDE 400 MG/5ML PO SUSP: 30.0000 mL | Freq: Every day | ORAL | Status: DC | PRN

## 2022-08-07 MED ORDER — ASPIRIN 81 MG PO TBEC
81.0000 mg | DELAYED_RELEASE_TABLET | Freq: Every day | ORAL | Status: DC
  Administered 2022-08-07: 81 mg via ORAL
  Filled 2022-08-07: qty 1

## 2022-08-07 MED ORDER — MECLIZINE HCL 25 MG PO TABS: 12.5000 mg | ORAL_TABLET | Freq: Three times a day (TID) | ORAL | Status: DC | PRN

## 2022-08-07 MED ORDER — ALLOPURINOL 300 MG PO TABS
300.0000 mg | ORAL_TABLET | Freq: Two times a day (BID) | ORAL | Status: DC
  Administered 2022-08-07: 300 mg via ORAL
  Filled 2022-08-07 (×2): qty 1

## 2022-08-07 MED ORDER — ISOSORBIDE MONONITRATE ER 60 MG PO TB24: 180.0000 mg | ORAL_TABLET | Freq: Every day | ORAL | Status: DC

## 2022-08-07 MED ORDER — FENTANYL CITRATE PF 50 MCG/ML IJ SOSY
25.0000 ug | PREFILLED_SYRINGE | INTRAMUSCULAR | Status: DC | PRN
  Administered 2022-08-07 (×2): 25 ug via INTRAVENOUS
  Filled 2022-08-07 (×2): qty 1

## 2022-08-07 MED ORDER — DAPAGLIFLOZIN PROPANEDIOL 10 MG PO TABS
10.0000 mg | ORAL_TABLET | Freq: Every day | ORAL | Status: DC
  Filled 2022-08-07: qty 1

## 2022-08-07 MED ORDER — TRAZODONE HCL 50 MG PO TABS: 25.0000 mg | ORAL_TABLET | Freq: Every evening | ORAL | Status: DC | PRN

## 2022-08-07 MED ORDER — TRAZODONE HCL 50 MG PO TABS: 150.0000 mg | ORAL_TABLET | Freq: Every day | ORAL | Status: DC

## 2022-08-07 MED ORDER — ISOSORBIDE MONONITRATE ER 60 MG PO TB24: 180.0000 mg | ORAL_TABLET | Freq: Every day | ORAL | 0 refills | Status: DC

## 2022-08-07 NOTE — ED Notes (Signed)
RN introduced self to pt. Pt A&Ox4. Pt reports ongoing CP not relieved by nitro but has had relieve with PRN fentanyl. RN informed pt PRN medication is cannot be administered yet but will be once time is due. Pt denies any further needs. Care ongoing.

## 2022-08-07 NOTE — Assessment & Plan Note (Signed)
-   We will continue has albuterol and hold off long-acting beta agonist.

## 2022-08-07 NOTE — Assessment & Plan Note (Addendum)
-   We will continue aspirin, Ranexa, beta-blocker and ACE inhibitor therapy.  In addition to [facility] and statin therapy.

## 2022-08-07 NOTE — Assessment & Plan Note (Addendum)
-   The patient will be admitted to an observation cardiac telemetry bed. - We will follow serial troponins. - The patient will be placed on as needed sublingual nitroglycerin and IV morphine sulfate for pain as well as aspirin. - Statin therapy will be provided and fasting lipids will be checked. - We will resume beta-blocker therapy. - Cardiology consult will be obtained. - I notified Dr. [name] about patient.

## 2022-08-07 NOTE — Discharge Summary (Signed)
Physician Discharge Summary   Patient: Lawrence [name] MRN: [id number] DOB: 06/26/1956  Admit date:     08/07/2022  Discharge date: 08/07/22  Discharge Physician: Berle Mull  PCP: Birdie Sons, MD  Recommendations at discharge: Follow-up with cardiology.  Follow-up with PCP.   Follow-up Information     Yolonda Kida, MD. Go in 1 week(s).   Specialties: Cardiology, Internal Medicine Contact information: [place] [address] [phone]         Birdie Sons, MD. Schedule an appointment as soon as possible for a visit in 1 week(s).   Specialty: Family Medicine Contact information: [address] [place] [address] [phone]                Discharge Diagnoses: Principal Problem:   Chest pain Active Problems:   CAD (coronary artery disease)   Chronic diastolic HF (heart failure) (HCC)   Chronic obstructive pulmonary disease (COPD) (HCC)   Atrial fibrillation, chronic (HCC)   Type 2 diabetes mellitus without complications (HCC)  Assessment and Plan  Noncardiac musculoskeletal chest pain Presents with complaints of chest pain.  Pain was sharp.  Patient reported that the pain was similar to his prior stent chest pain. Pain started at 9 PM last night.  Tells me that he was sleeping woke up with pain. Since he is here in the ER his pain has never resolved.  He always have at least 5 out of 10 pain. Tells me that the pain medication makes the pain better. Tells me that the movement makes the pain worse. Pain was also worse on gentle pressure. Denies any shortness of breath nausea.  No dizziness no lightheadedness. EKG showed atrial fibrillation without any significant ST-T wave changes. Cardiology was consulted. Troponin x2 were negative. Patient continues to report ongoing chest pain until time of the discharge and was concerned that he is actually going to need a cardiac catheterization. Based on their  assessment cardiology recommended no further ischemic work-up and outpatient follow-up.  Recommendation was to continue pain control. Repeat EKG performed as well as repeat troponin performed was also negative for any acute ischemia.  Latest Reference Range & Units 08/07/22 01:01 08/07/22 03:39 08/07/22 14:46  Troponin I (High Sensitivity) <18 ng/L _0 Based on this the patient was recommended to be discharged home on oral medications to help with his musculoskeletal pain.  Chronic pain syndrome. Patient is on Percocet 10/325 chronically at home. We will recommend to continue taking this medication. Patient is also on Robaxin which we will switch to Flexeril 10 mg 3 times daily as needed.  CAD (coronary artery disease) Cardiac catheterization June 2022 showed no significant lesion that can be targeted. Medical management recommended. Imdur dose increased.  Chronic diastolic HF (heart failure) (HCC) BNP elevated on the patient appears to be euvolemic. No evidence of decompensation of the heart failure. Continue current home regimen.  Chronic obstructive pulmonary disease (COPD) (HCC) No evidence of exacerbation. Continue home regimen.  Atrial fibrillation, chronic (HCC) Persistent. Continue Eliquis.  Type 2 diabetes mellitus without complications ([place]) Continue home regimen on discharge for this as well.  Pain control - [employer] Controlled Substance Reporting System database was reviewed. and patient was instructed, not to drive, operate heavy machinery, perform activities at heights, swimming or participation in water activities or provide baby-sitting services while on Pain, Sleep and Anxiety Medications; until their outpatient Physician has advised to do so again. Also recommended to  not to take more than prescribed Pain, Sleep and Anxiety Medications.  Consultants:  Cardiology  Procedures performed:  none  DISCHARGE MEDICATION: Allergies as of 08/07/2022        Reactions   Prednisone Other (See Comments), Hypertension   Pt states that this medication puts him in A-fib    Demerol  [meperidine Hcl]    Demerol [meperidine] Hives   Jardiance [empagliflozin] Other (See Comments)   Perineal pain   Sulfa Antibiotics Hives   Albuterol Sulfate [albuterol] Palpitations, Other (See Comments)   Pt currently uses this medication.     Morphine Sulfate Nausea And Vomiting, Rash, Other (See Comments)   Pt states that he is only allergic to the tablet form of this medication.          Medication List     STOP taking these medications    magnesium oxide 400 MG tablet Commonly known as: MAG-OX   meclizine 25 MG tablet Commonly known as: ANTIVERT   methocarbamol 500 MG tablet Commonly known as: ROBAXIN       TAKE these medications    Accu-Chek Guide Control Liqd Use with blood glucose monitor as directed   Accu-Chek Guide test strip Generic drug: glucose blood Use as instructed to check sugar daily for type 2 diabetes.   Accu-Chek Guide w/Device Kit Use to check blood sugars as directed   Accu-Chek Softclix Lancets lancets Use as instructed to check sugar daily for type 2 diabetes.   acetaminophen 325 MG tablet Commonly known as: TYLENOL Take 2 tablets (650 mg total) by mouth every 4 (four) hours as needed for headache or mild pain.   albuterol 108 (90 Base) MCG/ACT inhaler Commonly known as: VENTOLIN HFA INHALE 2 PUFFS BY MOUTH EVERY 6 HOURS AS NEEDED FOR SHORTNESS OF BREATH   allopurinol 300 MG tablet Commonly known as: ZYLOPRIM TAKE 1 TABLET BY MOUTH TWICE A DAY   ALPRAZolam 1 MG tablet Commonly known as: XANAX Take 1 tablet (1 mg total) by mouth 3 (three) times daily.   amiodarone 200 MG tablet Commonly known as: PACERONE Take 200 mg by mouth 2 (two) times daily.   aspirin 81 MG chewable tablet Chew 81 mg by mouth daily. Swallows whole   atorvastatin 80 MG tablet Commonly known as: LIPITOR TAKE 1 TABLET BY MOUTH AT  BEDTIME   Belsomra 5 MG Tabs Generic drug: Suvorexant TAKE 1 TABLET (5 MG TOTAL) BY MOUTH AT BEDTIME AS NEEDED   Breo Ellipta 100-25 MCG/ACT Aepb Generic drug: fluticasone furoate-vilanterol INHALE 1 PUFF BY MOUTH INTO THE LUNGS DAILY.   celecoxib 200 MG capsule Commonly known as: CELEBREX Take 1 capsule by mouth 2 (two) times daily.   cetirizine 10 MG tablet Commonly known as: ZYRTEC TAKE 1 TABLET BY MOUTH AT BEDTIME   cyclobenzaprine 10 MG tablet Commonly known as: FLEXERIL Take 1 tablet (10 mg total) by mouth 3 (three) times daily as needed for up to 7 days for muscle spasms.   diltiazem 180 MG 24 hr capsule Commonly known as: CARDIZEM CD Take 1 capsule (180 mg total) by mouth daily.   docusate sodium 100 MG capsule Commonly known as: COLACE Take 100 mg by mouth daily as needed for mild constipation.   Eliquis 5 MG Tabs tablet Generic drug: apixaban TAKE 1 TABLET BY MOUTH TWICE A DAY   Farxiga 10 MG Tabs tablet Generic drug: dapagliflozin propanediol TAKE 1 TABLET BY MOUTH DAILY   isosorbide mononitrate 60 MG 24 hr tablet Commonly known as:  IMDUR Take 3 tablets (180 mg total) by mouth daily. Start taking on: August 08, 2022 What changed:  medication strength how much to take   linaclotide 72 MCG capsule Commonly known as: LINZESS Take 72 mcg by mouth daily before breakfast.   lisinopril 10 MG tablet Commonly known as: ZESTRIL TAKE 1 TABLET BY MOUTH DAILY   metFORMIN 500 MG 24 hr tablet Commonly known as: GLUCOPHAGE-XR TAKE 1 TABLET BY MOUTH TWICE A DAY   metoprolol tartrate 25 MG tablet Commonly known as: LOPRESSOR Take 1 tablet (25 mg total) by mouth 2 (two) times daily.   naloxone 4 MG/0.1ML Liqd nasal spray kit Commonly known as: NARCAN 1 spray into nostril x1 and may repeat every 2-3 minutes until patient is responsive or EMS arrives   nitroGLYCERIN 0.4 MG SL tablet Commonly known as: NITROSTAT Place 1 tablet (0.4 mg total) under the  tongue every 5 (five) minutes as needed for chest pain. What changed: See the new instructions.   omega-3 acid ethyl esters 1 g capsule Commonly known as: LOVAZA TAKE 4 CAPSULES BY MOUTH DAILY   omeprazole 40 MG capsule Commonly known as: PRILOSEC TAKE 1 CAPSULE BY MOUTH 2 TIMES DAILY 30 MINUTES BEFORE BREAKFAST AND DINNER   ondansetron 4 MG disintegrating tablet Commonly known as: ZOFRAN-ODT Take 1 tablet (4 mg total) by mouth every 8 (eight) hours as needed for nausea or vomiting.   oxyCODONE-acetaminophen 10-325 MG tablet Commonly known as: PERCOCET TAKE 1 TABLET BY MOUTH EVERY 4 HOURS AS NEEDED FOR PAIN   ranolazine 1000 MG SR tablet Commonly known as: RANEXA Take 1 tablet (1,000 mg total) by mouth 2 (two) times daily.   Spiriva HandiHaler 18 MCG inhalation capsule Generic drug: tiotropium PLACE 1 CAPSULE INTO INHALER AND INHALE BY MOUTH ONCE A DAY.   torsemide 100 MG tablet Commonly known as: DEMADEX Take 0.5 tablets (50 mg total) by mouth daily.   traZODone 150 MG tablet Commonly known as: DESYREL TAKE 1 TABLET BY MOUTH AT BEDTIME   venlafaxine XR 75 MG 24 hr capsule Commonly known as: EFFEXOR-XR TAKE 1 CAPSULE BY MOUTH DAILY WITH BREAKFAST       Disposition: Home Diet recommendation: Cardiac diet  Discharge Exam: Vitals:   08/07/22 0730 08/07/22 0800 08/07/22 0900 08/07/22 1400  BP: 132/80 (!) 141/94 138/88 132/86  Pulse: 62 (!) 55 70 72  Resp: _0 Temp:   98.2 F (36.8 C) 98.7 F (37.1 C)  TempSrc:   Oral Oral  SpO2: 97% 95% 97% 96%  Weight:      Height:       General: Appear in mild distress; no visible Abnormal Neck Mass Or lumps, Conjunctiva normal Cardiovascular: S1 and S2 Present, no Murmur, mild chest tenderness on gentle pressure on the left side Respiratory: good respiratory effort, Bilateral Air entry present and CTA, no Crackles, no wheezes Abdomen: Bowel Sound present, Non tender  Extremities: no Pedal edema Neurology: alert  and oriented to time, place, and person  Filed Weights   08/07/22 0103  Weight: 82.1 kg   Condition at discharge: stable  The results of significant diagnostics from this hospitalization (including imaging, microbiology, ancillary and laboratory) are listed below for reference.   Imaging Studies: DG Chest Portable 1 View  Result Date: 08/07/2022 CLINICAL DATA:  Sharp chest pain EXAM: PORTABLE CHEST 1 VIEW COMPARISON:  07/15/2022 FINDINGS: Stable heart size and mediastinal contours. Prior median sternotomy and coronary stenting. Interstitial coarsening and without Kerley line, effusion,  or air bronchogram. IMPRESSION: Negative for edema or pneumonia. Electronically Signed   By: Jorje Guild M.D.   On: 08/07/2022 05:19   MR BRAIN WO CONTRAST  Result Date: 07/16/2022 CLINICAL DATA:  Initial evaluation for neuro deficit, stroke suspected, vertigo. EXAM: MRI HEAD WITHOUT CONTRAST TECHNIQUE: Multiplanar, multiecho pulse sequences of the brain and surrounding structures were obtained without intravenous contrast. COMPARISON:  Prior CT from 07/15/2022. FINDINGS: Brain: Cerebral volume within normal limits for age. Mild patchy T2/FLAIR hyperintensity involving the periventricular and deep white matter both cerebral hemispheres, most consistent with chronic small vessel ischemic disease, mild for age. No evidence for acute or subacute ischemia. Gray-white matter differentiation maintained. No areas of chronic cortical infarction. No acute or chronic intracranial blood products. No mass lesion, midline shift or mass effect no hydrocephalus or extra-axial fluid collection. Pituitary gland and suprasellar region within normal limits. Vascular: Major intracranial vascular flow voids are maintained. Skull and upper cervical spine: Craniocervical junction within normal limits. Bone marrow signal intensity normal. No scalp soft tissue abnormality. Sinuses/Orbits: Globes and orbital soft tissues demonstrate no  acute finding. Scattered mucosal thickening noted throughout the ethmoidal air cells and maxillary sinuses. Trace left mastoid effusion, of doubtful significance. Other: None. IMPRESSION: 1. No acute intracranial infarct or other abnormality. 2. Mild chronic microvascular ischemic disease for age. Electronically Signed   By: Jeannine Boga M.D.   On: 07/16/2022 02:24   DG Chest 2 View  Result Date: 07/15/2022 CLINICAL DATA:  Weakness EXAM: CHEST - 2 VIEW COMPARISON:  Chest x-ray 07/07/2022 FINDINGS: Median sternotomy wires and mediastinal clips are present. The heart size and mediastinal contours are within normal limits. Both lungs are clear. The visualized skeletal structures are unremarkable. IMPRESSION: No active cardiopulmonary disease. Electronically Signed   By: Ronney Asters M.D.   On: 07/15/2022 19:12   CT HEAD WO CONTRAST (5MM)  Result Date: 07/15/2022 CLINICAL DATA:  Ataxia.  Coordination difficulty.  Nausea vomiting. EXAM: CT HEAD WITHOUT CONTRAST TECHNIQUE: Contiguous axial images were obtained from the base of the skull through the vertex without intravenous contrast. RADIATION DOSE REDUCTION: This exam was performed according to the departmental dose-optimization program which includes automated exposure control, adjustment of the mA and/or kV according to patient size and/or use of iterative reconstruction technique. COMPARISON:  12/11/2020. FINDINGS: Brain: No evidence of acute infarction, hemorrhage, hydrocephalus, extra-axial collection or mass lesion/mass effect. Vascular: No hyperdense vessel or unexpected calcification. Skull: Normal. Negative for fracture or focal lesion. Sinuses/Orbits: Globes and orbits are unremarkable. Mild left maxillary sinus mucosal thickening with a small amount of dependent fluid. Other: None. IMPRESSION: 1. No acute intracranial abnormalities. 2. Mild left maxillary sinus mucosal thickening with a small amount of dependent fluid. Electronically Signed    By: Lajean Manes M.D.   On: 07/15/2022 14:25    Microbiology: Results for orders placed or performed during the hospital encounter of 07/07/22  SARS Coronavirus 2 by RT PCR (hospital order, performed in [facility] hospital lab) *cepheid single result test* Anterior Nasal Swab     Status: None   Collection Time: 07/08/22  4:02 AM   Specimen: Anterior Nasal Swab  Result Value Ref Range Status   SARS Coronavirus 2 by RT PCR NEGATIVE NEGATIVE Final    Comment: (NOTE) SARS-CoV-2 target nucleic acids are NOT DETECTED.  The SARS-CoV-2 RNA is generally detectable in upper and lower respiratory specimens during the acute phase of infection. The lowest concentration of SARS-CoV-2 viral copies this assay can detect is 250  copies / mL. A negative result does not preclude SARS-CoV-2 infection and should not be used as the sole basis for treatment or other patient management decisions.  A negative result may occur with improper specimen collection / handling, submission of specimen other than nasopharyngeal swab, presence of viral mutation(s) within the areas targeted by this assay, and inadequate number of viral copies (<250 copies / mL). A negative result must be combined with clinical observations, patient history, and epidemiological information.  Fact Sheet for Patients:   https://www..info/  Fact Sheet for Healthcare Providers: https://hall.com/  This test is not yet approved or  cleared by the [place] FDA and has been authorized for detection and/or diagnosis of SARS-CoV-2 by FDA under an Emergency Use Authorization (EUA).  This EUA will remain in effect (meaning this test can be used) for the duration of the COVID-19 declaration under Section 564(b)(1) of the Act, 21 U.S.C. section 360bbb-3(b)(1), unless the authorization is terminated or revoked sooner.  Performed at [facility], [address] [place], [place]  [id number]    Labs: CBC: Recent Labs  Lab 08/07/22 0101  WBC 7.6  NEUTROABS 4.7  HGB 15.8  HCT 50.3  MCV 95.8  PLT 974   Basic Metabolic Panel: Recent Labs  Lab 08/07/22 0101  NA 143  K 4.3  CL 106  CO2 31  GLUCOSE 154*  BUN 12  CREATININE 0.73  CALCIUM 9.1   Liver Function Tests: Recent Labs  Lab 08/07/22 0101  AST 14*  ALT 12  ALKPHOS 76  BILITOT 0.7  PROT 6.3*  ALBUMIN 3.6   CBG: Recent Labs  Lab 08/07/22 0837 08/07/22 1142  GLUCAP 131* 126*    Discharge time spent: greater than 30 minutes.  Signed: Berle Mull, MD [facility]

## 2022-08-07 NOTE — Assessment & Plan Note (Signed)
>>  ASSESSMENT AND PLAN FOR ATRIAL FIBRILLATION, CHRONIC WRITTEN ON 08/07/2022  6:08 AM BY MANSY, JAN A, MD  - We will continue amiodarone and Cardizem. - We will continue Eliquis.

## 2022-08-07 NOTE — Assessment & Plan Note (Signed)
-   We will continue amiodarone and Cardizem. - We will continue Eliquis.

## 2022-08-07 NOTE — ED Provider Notes (Signed)
[facility] Provider Note    Event Date/Time   First MD Initiated Contact with Patient 08/07/22 0101     (approximate)   History   Chest Pain   HPI  Lawrence [name] is a 66 y.o. male who presents to the ED for evaluation of Chest Pain   I reviewed cardiology clinic visit from 8/29.  History of A-fib, CAD s/p CABG and subsequent stenting, COPD, CHF, DM, HTN.  He is on Eliquis and amiodarone.  Patient presents to the ED for evaluation of chest pain that has been going on for the past few hours.  Reports constant chest pain for the past 3-4 hours without relief with home nitro.  Has already taken 324 of aspirin prior to arrival.  Feels somewhat nauseous with it, but no dyspnea, emesis, syncope or falls.  Physical Exam   Triage Vital Signs: ED Triage Vitals  Enc Vitals Group     BP      Pulse      Resp      Temp      Temp src      SpO2      Weight      Height      Head Circumference      Peak Flow      Pain Score      Pain Loc      Pain Edu?      Excl. in [place]?     Most recent vital signs: Vitals:   08/07/22 0430 08/07/22 0454  BP: 119/82   Pulse: 64   Resp: 13   Temp:  97.8 F (36.6 C)  SpO2: 96%     General: Awake, no distress.  CV:  Good peripheral perfusion.  Resp:  Normal effort.  Abd:  No distention.  MSK:  No deformity noted.  Neuro:  No focal deficits appreciated. Other:     ED Results / Procedures / Treatments   Labs (all labs ordered are listed, but only abnormal results are displayed) Labs Reviewed  COMPREHENSIVE METABOLIC PANEL - Abnormal; Notable for the following components:      Result Value   Glucose, Bld 154 (*)    Total Protein 6.3 (*)    AST 14 (*)    All other components within normal limits  BRAIN NATRIURETIC PEPTIDE - Abnormal; Notable for the following components:   B Natriuretic Peptide 313.6 (*)    All other components within normal limits  URINALYSIS, ROUTINE W REFLEX MICROSCOPIC - Abnormal;  Notable for the following components:   Glucose, UA 100 (*)    Hgb urine dipstick TRACE (*)    All other components within normal limits  URINALYSIS, MICROSCOPIC (REFLEX) - Abnormal; Notable for the following components:   Bacteria, UA RARE (*)    All other components within normal limits  CBC WITH DIFFERENTIAL/PLATELET  TROPONIN I (HIGH SENSITIVITY)  TROPONIN I (HIGH SENSITIVITY)    EKG Atrial fibrillation with a rate of 58 bpm.  Normal axis and intervals.  Nonspecific ST changes inferiorly and laterally without STEMI.  RADIOLOGY   Official radiology report(s): No results found.  PROCEDURES and INTERVENTIONS:  .1-3 Lead EKG Interpretation  Performed by: Vladimir Crofts, MD Authorized by: Vladimir Crofts, MD     Interpretation: normal     ECG rate:  62   ECG rate assessment: normal     Rhythm: atrial fibrillation     Ectopy: none     Conduction: normal  Medications  nitroGLYCERIN (NITROSTAT) SL tablet 0.4 mg (0.4 mg Sublingual Given 08/07/22 0242)  fentaNYL (SUBLIMAZE) injection 50 mcg (50 mcg Intravenous Given 08/07/22 0114)  fentaNYL (SUBLIMAZE) injection 50 mcg (50 mcg Intravenous Given 08/07/22 0240)  fentaNYL (SUBLIMAZE) injection 75 mcg (75 mcg Intravenous Given 08/07/22 0450)     IMPRESSION / MDM / ASSESSMENT AND PLAN / ED COURSE  I reviewed the triage vital signs and the nursing notes.  Differential diagnosis includes, but is not limited to, ACS, PTX, PNA, muscle strain/spasm, PE, dissection  {Patient presents with symptoms of an acute illness or injury that is potentially life-threatening.  66 year old male with extensive coronary history presents to the ED with chest pain requiring medical observation admission.  EKG without clear ischemic features and he has 2 negative troponins.  Essentially normal CBC and metabolic panel.  His BNP is slightly elevated, but no complaints of dyspnea and without signs of gross volume overload.  Due to his persistent pain and  requiring multiple doses of fentanyl to control it, we will consult medicine for admission.  Clinical Course as of 08/07/22 0455  Fri Aug 07, 2022  0126 reassessed [DS]  8811 Reassessed. Feeling better.  Reports the nitroglycerin has not been helping his pain, but the fentanyl did.  No complaints now.  Requesting ginger ale [DS]    Clinical Course User Index [DS] Vladimir Crofts, MD     FINAL CLINICAL IMPRESSION(S) / ED DIAGNOSES   Final diagnoses:  Other chest pain     Rx / DC Orders   ED Discharge Orders     None        Note:  This document was prepared using Dragon voice recognition software and may include unintentional dictation errors.   Vladimir Crofts, MD 08/07/22 [id number]

## 2022-08-07 NOTE — Assessment & Plan Note (Signed)
>>  ASSESSMENT AND PLAN FOR CAD (CORONARY ARTERY DISEASE) WRITTEN ON 08/07/2022  6:09 AM BY MANSY, JAN A, MD  - We will continue aspirin, Ranexa, beta-blocker and ACE inhibitor therapy.  In addition to [facility] and statin therapy.

## 2022-08-07 NOTE — ED Triage Notes (Signed)
Pt coming from home via [facility]. Per EMS, pt was having sharp chest pain that woke him up out of his sleep. Pt states the chest pain is the center of his chest.  Pt has 324 mg of aspirin and 3 nitro prior to EMS arrival. Pt has a hx of CABG, stents, bypass, and previous MI.   Pt currently talking in complete sentences at this time.

## 2022-08-07 NOTE — Assessment & Plan Note (Signed)
-   We will continue diuretic therapy. - He does not seem to be in clinical acute exacerbation.

## 2022-08-07 NOTE — Assessment & Plan Note (Signed)
-   The patient will be placed on supplement coverage with NovoLog. - We will hold off metformin. - We will continue [place].

## 2022-08-07 NOTE — Consult Note (Addendum)
Lawrence [name]       Patient ID: Lawrence [name] MRN: [id number] DOB/AGE: 68-Nov-1957 68 y.o.  Admit date: 08/07/2022 Referring Physician Dr. Eugenie Norrie  Primary Physician Dr. [name] Primary Cardiologist Dr. Clayborn Bigness Reason for Consultation chest pain  HPI: Lawrence [name] is a 66yoM with a PMH of CAD (h/o STEMI s/p CABG x2 LIMA-LAD (atretic and small) and patent SVG-OM1 2002 c/b coronary artery dissection with multiple subsequent PCI and stents), HFpEF (LVEF 50% with apical septal akinesis, mild TR and MR 06/12/2022), paroxysmal atrial fibrillation s/p unsuccessful DCCV 06/29/2022, chronic respiratory failure on as needed 2 L, type 2 diabetes who presented to [facility] ED 08/07/2022 with chest pain for which cardiology is consulted for further assistance.  The patient is well-known to our service this with a longstanding history of chronic angina in the setting of significant CAD complicated by atrial fibrillation on Eliquis and amiodarone.  He presents to [facility] ED today with chest pain he describes as a heaviness that for started when he was sweeping the floor last night.  He sat down and it eased off completely without intervention.  He went to bed and woke up from his sleep at 11 PM without similar chest heaviness he rated an 8/10, took 4 baby aspirin followed by 3 nitroglycerin 5 minutes apart without relief of the discomfort.  The chest heaviness did not radiate, was associated with some nausea but no diaphoresis or shortness of breath.  He denies palpitations orthopnea, PND, or peripheral edema, but notes he has a wound on his right leg from hitting the bedpost that is somewhat painful.  It has been "at least a couple months" per patient since he had chest pain like this.  He has chronic exertional dyspnea he attributes to his history of smoking and COPD that has been stable.  He reports compliance with all his medications, including his Eliquis, last dose was this morning  while he was in the ER.  At my time of evaluation is laying flat in bed, chest pain-free although he states that "as soon as that pain medication wears off, the pain starts right back again."  He is referring to the IV fentanyl that he last received at 0911.  He was told by his regular cardiologist that at some point in the future he would likely need another left heart cath to assess the patency of his grafts and his his other known coronary disease.  Notably, his pain is reproducible on exam today.  Vitals are notable for a blood pressure of 138/88, heart rate in the 70s in atrial fibrillation on telemetry.  He is comfortable on room air.  Labs are notable for potassium of 4.3, BUN/creatinine 12/0.73 and GFR greater than 60.  BNP slightly elevated at 313, high-sensitivity troponin negative on 2 repeats at 17-15.  H&H stable at 15/50 and platelets 190.  Chest x-ray with interstitial coarsening without edema or signs of pneumonia.  His EKG showed atrial fibrillation with inferior Q waves with rate 60, essentially unchanged from prior from 8/30.  Review of systems complete and found to be negative unless listed above     Past Medical History:  Diagnosis Date   A-fib ([place])    Anemia    Anginal pain ([place])    Anxiety    Arthritis    Asthma    CAD (coronary artery disease)    a. 2002 CABGx2 (LIMA->LAD, VG->VG->OM1);  b. 09/2012 DES->OM;  c. 03/2015 PTCA of LAD [facility])  in setting of atretic LIMA; d. 05/2015 Cath [facility]): nonobs dzs; e. 06/2015 Cath ([facility]): LM nl, LAD 45p/d ISR, 50d, D1/2 small, LCX 50p/d ISR, OM1 70ost, 30 ISR, VG->OM1 50ost, 67m LIMA->LAD 99p/d - atretic, RCA dom, nl; f.cath 10/16: 40-50%(FFR 0.90) pLAD, 75% (FFR 0.77) mLAD s/p PCI/DES, oRCA 40% (FFR0.95)   Cancer (HCC)    SKIN CANCER ON BACK   Celiac disease    Chronic diastolic CHF (congestive heart failure) ([place]    a. 06/2009 Echo: EF 60-65%, Gr 1 DD, triv AI, mildly dil LA, nl RV.   COPD (chronic obstructive pulmonary disease)  ([place]    a. Chronic bronchitis and emphysema.   DDD (degenerative disc disease), lumbar    Diverticulosis    Dysrhythmia    Essential hypertension    GERD (gastroesophageal reflux disease)    History of hiatal hernia    History of kidney stones    H/O   History of tobacco abuse    a. Quit 2014.   Myocardial infarction ([facility] 2002   4 STENTS   Pancreatitis    PSVT (paroxysmal supraventricular tachycardia) ([place]    a. 10/2012 Noted on Zio Patch.   Sleep apnea    LOST CORD TO CPAP -ONLY 02 @ BEDTIME   Tubular adenoma of colon    Type II diabetes mellitus ([place]     Past Surgical History:  Procedure Laterality Date   BYPASS GRAFT     CARDIAC CATHETERIZATION N/A 07/12/2015   rocedure: Left Heart Cath and Cors/Grafts Angiography;  Surgeon: HBelva Crome MD;  [place];  Service: Cardiovascular;  Laterality: N/A;   CARDIAC CATHETERIZATION Right 10/07/2015   Procedure: Left Heart Cath and Cors/Grafts Angiography;  Surgeon: SDionisio David MD;  [place] [facility];  Service: Cardiovascular;  Laterality: Right;   CARDIAC CATHETERIZATION N/A 04/06/2016   Procedure: Left Heart Cath and Coronary Angiography;  Surgeon: DYolonda Kida MD;  [place];  Service: Cardiovascular;  Laterality: N/A;   CARDIAC CATHETERIZATION  04/06/2016   Procedure: Bypass Graft Angiography;  Surgeon: DYolonda Kida MD;  [place];  Service: Cardiovascular;;   CARDIAC CATHETERIZATION N/A 11/02/2016   Procedure: Left Heart Cath and Cors/Grafts Angiography and possible PCI;  Surgeon: DYolonda Kida MD;  [place];  Service: Cardiovascular;  Laterality: N/A;   CARDIAC CATHETERIZATION N/A 11/02/2016   Procedure: Coronary Stent Intervention;  Surgeon: DYolonda Kida MD;  [place];  Service: Cardiovascular;  Laterality: N/A;   CARDIOVERSION N/A 06/29/2022   Procedure: CARDIOVERSION;  Surgeon:  KCorey Skains MD;  Location: [facility] ORS;  Service: Cardiovascular;  Laterality: N/A;   CHOLECYSTECTOMY     CIRCUMCISION N/A 06/09/2019   Procedure: CIRCUMCISION ADULT;  Surgeon: SBilley Co MD;  Location: [facility] ORS;  Service: Urology;  Laterality: N/A;   COLONOSCOPY WITH PROPOFOL N/A 04/01/2018   Procedure: COLONOSCOPY WITH PROPOFOL;  Surgeon: EManya Silvas MD;  [facility];  Service: Endoscopy;  Laterality: N/A;   ESOPHAGEAL DILATION     ESOPHAGOGASTRODUODENOSCOPY (EGD) WITH PROPOFOL N/A 04/01/2018   Procedure: ESOPHAGOGASTRODUODENOSCOPY (EGD) WITH PROPOFOL;  Surgeon: EManya Silvas MD;  [facility];  Service: Endoscopy;  Laterality: N/A;   LEFT HEART CATH AND CORS/GRAFTS ANGIOGRAPHY N/A 06/12/2019   Procedure: LEFT HEART CATH AND CORS/GRAFTS ANGIOGRAPHY;  Surgeon: FTeodoro Spray MD;  [place];  Service: Cardiovascular;  Laterality: N/A;  LEFT HEART CATH AND CORS/GRAFTS ANGIOGRAPHY N/A 03/11/2020   Procedure: LEFT HEART CATH AND CORS/GRAFTS ANGIOGRAPHY;  Surgeon: Isaias Cowman, MD;  Location: [place] CV LAB;  Service: Cardiovascular;  Laterality: N/A;   LEFT HEART CATH AND CORS/GRAFTS ANGIOGRAPHY N/A 05/01/2021   Procedure: LEFT HEART CATH AND CORS/GRAFTS ANGIOGRAPHY;  Surgeon: Corey Skains, MD;  Location: [place] CV LAB;  Service: Cardiovascular;  Laterality: N/A;   TONSILLECTOMY     VASCULAR SURGERY      (Not in a hospital admission)  Social History   Socioeconomic History   Marital status: Widowed    Spouse name: Not on file   Number of children: 1   Years of education: Not on file   Highest education level: 10th grade  Occupational History   Occupation: Disabled   Occupation: on [employer] farm manager  Tobacco Use   Smoking status: Former    Packs/day: 3.00    Years: 50.00    Total pack years: 150.00    Types: Cigarettes    Quit date: 04/22/2013    Years since quitting: 9.2   Smokeless tobacco:  Never   Tobacco comments:    Reports not smoking for approx 8 years.  Vaping Use   Vaping Use: Never used  Substance and Sexual Activity   Alcohol use: No    Comment: remotely quit alcohol use. Hx of heavy alcohol use.   Drug use: Yes    Types: Marijuana    Comment: occasionally   Sexual activity: Not on file  Other Topics Concern   Not on file  Social History Narrative   Pt lives in [place] with wife.  Does not routinely exercise.   Social Determinants of Health   Financial Resource Strain: Low Risk  (01/08/2022)   Overall Financial Resource Strain (CARDIA)    Difficulty of Paying Living Expenses: Not hard at all  Food Insecurity: No Food Insecurity (01/08/2022)   Hunger Vital Sign    Worried About Running Out of Food in the Last Year: Never true    Ran Out of Food in the Last Year: Never true  Transportation Needs: No Transportation Needs (01/08/2022)   PRAPARE - Hydrologist (Medical): No    Lack of Transportation (Non-Medical): No  Physical Activity: Insufficiently Active (01/08/2022)   Exercise Vital Sign    Days of Exercise per Week: 3 days    Minutes of Exercise per Session: 20 min  Stress: No Stress Concern Present (01/08/2022)   [place]    Feeling of Stress : Not at all  Social Connections: Moderately Isolated (01/08/2022)   Social Connection and Isolation Panel [NHANES]    Frequency of Communication with Friends and Family: More than three times a week    Frequency of Social Gatherings with Friends and Family: More than three times a week    Attends Religious Services: More than 4 times per year    Active Member of [employer] or Organizations: No    Attends Archivist Meetings: Never    Marital Status: Widowed  Intimate Partner Violence: Not At Risk (01/08/2022)   Humiliation, Afraid, Rape, and Kick questionnaire    Fear of Current or Ex-Partner: No     Emotionally Abused: No    Physically Abused: No    Sexually Abused: No    Family History  Problem Relation Age of Onset   Heart attack Mother    Depression Mother  Heart disease Mother    COPD Mother    Hypertension Mother    Heart attack Father    Diabetes Father    Depression Father    Heart disease Father    Cirrhosis Father    Parkinson's disease Brother       PHYSICAL EXAM General: Chronically ill-appearing Caucasian male, in no acute distress.  Laying flat in hospital bed. HEENT:  Normocephalic and atraumatic. Neck:  No JVD.  Lungs: Normal respiratory effort on room air.  Decreased breath sounds throughout without appreciable crackles or wheezes.  Heart: Irregularly irregular rhythm with controlled rate. Normal S1 and S2 without gallops or murmurs.  Chest: reproducible pain to palpation of his anterior chest wall Abdomen: Non-distended appearing.  Msk: Normal strength and tone for age. Extremities: Warm and well perfused. No clubbing, cyanosis.  No peripheral edema.  Neuro: Alert and oriented X 3. Psych:  Answers questions appropriately.   Labs: Basic Metabolic Panel: Recent Labs    08/07/22 0101  NA 143  K 4.3  CL 106  CO2 31  GLUCOSE 154*  BUN 12  CREATININE 0.73  CALCIUM 9.1   Liver Function Tests: Recent Labs    08/07/22 0101  AST 14*  ALT 12  ALKPHOS 76  BILITOT 0.7  PROT 6.3*  ALBUMIN 3.6   No results for input(s): "LIPASE", "AMYLASE" in the last 72 hours. CBC: Recent Labs    08/07/22 0101  WBC 7.6  NEUTROABS 4.7  HGB 15.8  HCT 50.3  MCV 95.8  PLT 190   Cardiac Enzymes: Recent Labs    08/07/22 0101 08/07/22 0339  TROPONINIHS 17 15   BNP: Invalid input(s): "POCBNP" D-Dimer: No results for input(s): "DDIMER" in the last 72 hours. Hemoglobin A1C: No results for input(s): "HGBA1C" in the last 72 hours. Fasting Lipid Panel: No results for input(s): "CHOL", "HDL", "LDLCALC", "TRIG", "CHOLHDL", "LDLDIRECT" in the last 72  hours. Thyroid Function Tests: No results for input(s): "TSH", "T4TOTAL", "T3FREE", "THYROIDAB" in the last 72 hours.  Invalid input(s): "FREET3" Anemia Panel: No results for input(s): "VITAMINB12", "FOLATE", "FERRITIN", "TIBC", "IRON", "RETICCTPCT" in the last 72 hours.   Radiology: DG Chest Portable 1 View  Result Date: 08/07/2022 CLINICAL DATA:  Sharp chest pain EXAM: PORTABLE CHEST 1 VIEW COMPARISON:  07/15/2022 FINDINGS: Stable heart size and mediastinal contours. Prior median sternotomy and coronary stenting. Interstitial coarsening and without Kerley line, effusion, or air bronchogram. IMPRESSION: Negative for edema or pneumonia. Electronically Signed   By: Jorje Guild M.D.   On: 08/07/2022 05:19   MR BRAIN WO CONTRAST  Result Date: 07/16/2022 CLINICAL DATA:  Initial evaluation for neuro deficit, stroke suspected, vertigo. EXAM: MRI HEAD WITHOUT CONTRAST TECHNIQUE: Multiplanar, multiecho pulse sequences of the brain and surrounding structures were obtained without intravenous contrast. COMPARISON:  Prior CT from 07/15/2022. FINDINGS: Brain: Cerebral volume within normal limits for age. Mild patchy T2/FLAIR hyperintensity involving the periventricular and deep white matter both cerebral hemispheres, most consistent with chronic small vessel ischemic disease, mild for age. No evidence for acute or subacute ischemia. Gray-white matter differentiation maintained. No areas of chronic cortical infarction. No acute or chronic intracranial blood products. No mass lesion, midline shift or mass effect no hydrocephalus or extra-axial fluid collection. Pituitary gland and suprasellar region within normal limits. Vascular: Major intracranial vascular flow voids are maintained. Skull and upper cervical spine: Craniocervical junction within normal limits. Bone marrow signal intensity normal. No scalp soft tissue abnormality. Sinuses/Orbits: Globes and orbital soft tissues demonstrate no acute  finding.  Scattered mucosal thickening noted throughout the ethmoidal air cells and maxillary sinuses. Trace left mastoid effusion, of doubtful significance. Other: None. IMPRESSION: 1. No acute intracranial infarct or other abnormality. 2. Mild chronic microvascular ischemic disease for age. Electronically Signed   By: Jeannine Boga M.D.   On: 07/16/2022 02:24   DG Chest 2 View  Result Date: 07/15/2022 CLINICAL DATA:  Weakness EXAM: CHEST - 2 VIEW COMPARISON:  Chest x-ray 07/07/2022 FINDINGS: Median sternotomy wires and mediastinal clips are present. The heart size and mediastinal contours are within normal limits. Both lungs are clear. The visualized skeletal structures are unremarkable. IMPRESSION: No active cardiopulmonary disease. Electronically Signed   By: Ronney Asters M.D.   On: 07/15/2022 19:12   CT HEAD WO CONTRAST (5MM)  Result Date: 07/15/2022 CLINICAL DATA:  Ataxia.  Coordination difficulty.  Nausea vomiting. EXAM: CT HEAD WITHOUT CONTRAST TECHNIQUE: Contiguous axial images were obtained from the base of the skull through the vertex without intravenous contrast. RADIATION DOSE REDUCTION: This exam was performed according to the departmental dose-optimization program which includes automated exposure control, adjustment of the mA and/or kV according to patient size and/or use of iterative reconstruction technique. COMPARISON:  12/11/2020. FINDINGS: Brain: No evidence of acute infarction, hemorrhage, hydrocephalus, extra-axial collection or mass lesion/mass effect. Vascular: No hyperdense vessel or unexpected calcification. Skull: Normal. Negative for fracture or focal lesion. Sinuses/Orbits: Globes and orbits are unremarkable. Mild left maxillary sinus mucosal thickening with a small amount of dependent fluid. Other: None. IMPRESSION: 1. No acute intracranial abnormalities. 2. Mild left maxillary sinus mucosal thickening with a small amount of dependent fluid. Electronically Signed   By: Lajean Manes M.D.   On: 07/15/2022 14:25    Placer 03/11/2020 Cardiac Catheterization: (03/11/2020) Narrative  1st Mrg lesion is 70% stenosed.  Previously placed Prox Cx to Mid Cx stent (unknown type) is widely patent.  Prox LAD to Mid LAD lesion is 30% stenosed.  Mid LAD lesion is 40% stenosed.  Previously placed Mid LAD to Dist LAD stent (unknown type) is widely patent.  Prox RCA lesion is 15% stenosed.  Dist RCA lesion is 25% stenosed.  RPAV lesion is 60% stenosed.  Previously placed Origin to Prox Graft stent (unknown type) is widely patent.  LIMA graft was not injected and is small.  1. Two-vessel coronary artery disease with patent stents proximal mid and distal LAD, proximal left circumflex, 70% stenosis OM1 2. Atretic LIMA to LAD, patent SVG to OM1 with patent stent ostium SVG 3. Mildly reduced left ventricular function with estimated LVEF 40 to 45% 4. No significant changes observed compared to prior cardiac catheterization 06/12/2019  ECHO 06/12/2022               HISTORY: Chest pain, Coronary Artery Disease                 REASON: Assess, LV function             INDICATION: I25.118 Coronary artery disease of native artery of native heart,                         F64.33 Chronic diastolic (congestive) heart failure  _________________________________________________________________________________________  ECHOCARDIOGRAPHIC MEASUREMENTS  2D DIMENSIONS  AORTA                  Values   Normal Range   MAIN PA         Values    Normal Range  Annulus: nm*          [2.3-2.9]         PA Main: nm*       [1.5-2.1]              Aorta Sin: 3.5 cm       [3.1-3.7]    RIGHT VENTRICLE            ST Junction: 2.7 cm       [2.6-3.2]         RV Base: 4.3 cm    [< 4.2]              Asc.Aorta: 3.5 cm       [2.6-3.4]          RV Mid: 3.7 cm    [< 3.5]  LEFT VENTRICLE                                      RV Length: nm*       [<8.6]                  LVIDd: 4.6 cm       [4.2-5.9]     INFERIOR VENA CAVA                  LVIDs: 3.6 cm                        Max. IVC: nm*       [<=2.1]                     FS: 22.4 %       [>25]            Min. IVC: nm*                    SWT: 1.3 cm       [0.6-1.0]    ------------------                    PWT: 1.3 cm       [0.6-1.0]    nm* - not measured  LEFT ATRIUM                LA Diam: 4.7 cm       [3.0-4.0]            LA A4C Area: nm*          [<20]              LA Volume: nm*          [18-58]  _________________________________________________________________________________________  ECHOCARDIOGRAPHIC DESCRIPTIONS  AORTIC ROOT                   Size: Normal             Dissection: INDETERM FOR DISSECTION  AORTIC VALVE               Leaflets: Tricuspid                   Morphology: Normal               Mobility: Fully mobile  LEFT VENTRICLE                   Size: Normal  Anterior: Normal            Contraction: REGIONALLY IMPAIRED            Lateral: Normal             Closest EF: 50% (Estimated)                 Septal: AKINETIC              LV Masses: No Masses                       Apical: AKINETIC                    LVH: None                          Inferior: Normal                                                      Posterior: Normal           Dias.FxClass: (Grade 1) relaxation abnormal, E/A reversal  MITRAL VALVE               Leaflets: Normal                        Mobility: Fully mobile             Morphology: Normal  LEFT ATRIUM                   Size: MILDLY ENLARGED              LA Masses: No masses              IA Septum: Normal IAS  MAIN PA                   Size: Normal  PULMONIC VALVE             Morphology: Normal                        Mobility: Fully mobile  RIGHT VENTRICLE                   Size: MILDLY ENLARGED              Free Wall: Normal            Contraction: Normal                       RV Masses: No Masses  TRICUSPID VALVE               Leaflets: Normal                         Mobility: Fully mobile             Morphology: Normal  RIGHT ATRIUM                   Size: MILDLY ENLARGED               RA Other: None  RA Mass: No masses  PERICARDIUM                  Fluid: No effusion  INFERIOR VENACAVA                   Size: Normal Normal respiratory collapse  _________________________________________________________________________________________   DOPPLER ECHO and OTHER SPECIAL PROCEDURES                 Aortic: TRIVIAL AR                 No AS                         129.0 cm/sec peak vel      6.7 mmHg peak grad                         3.4 mmHg mean grad         3.4 cm^2 by DOPPLER                 Mitral: MILD MR                    No MS                         MV Inflow E Vel = 88.3 cm/sec      MV Annulus E'Vel = nm*                         E/E'Ratio = nm*              Tricuspid: MILD TR                    No TS                         281.2 cm/sec peak TR vel   34.6 mmHg peak RV pressure              Pulmonary: TRIVIAL PR                 No PS  _________________________________________________________________________________________  INTERPRETATION  REGIONALLY-IMPAIRED LEFT VENTRICLE WITH PRESERVED SYSTOLIC FUNCTION; ESTIMATED EF = 50 %  NORMAL RIGHT VENTRICULAR SYSTOLIC FUNCTION  MILD TRICUSPID AND MITRAL VALVE INSUFFICIENCY  TRACE AORTIC VALVE INSUFFICIENCY  NO VALVULAR STENOSIS  MILD RV ENLARGEMENT  MILD BIATRIAL ENLARGEMENT  MILD LVH  APICAL SEPTAL AKINESIS   TELEMETRY reviewed by me (LT) 08/07/2022 : Atrial fibrillation initially in the 90s, now rate in the 60s  EKG reviewed by me: Atrial fibrillation rate 60 with inferior Q waves that are stable and essentially unchanged from 07/15/2022  Data reviewed by me (LT) 08/07/2022: ED [name], admission H&P, last cardiology clinic [name], EKGs, telemetry, CBC, BMP, troponins, nursing notes, BNP, discussed with hospitalist  ASSESSMENT AND PLAN:  Lawrence [name] is a 1yoM with a PMH of CAD  (h/o STEMI s/p CABG x2 LIMA-LAD (atretic and small) and patent SVG-OM1 2002 c/b coronary artery dissection with multiple subsequent PCI and stents), HFpEF (LVEF 50% with apical septal akinesis, mild TR and MR 06/12/2022), paroxysmal atrial fibrillation s/p unsuccessful DCCV 06/29/2022, chronic respiratory failure on as needed 2 L, type 2 diabetes who presented to [facility] ED 08/07/2022 with chest pain for which cardiology is consulted for further assistance.  #CAD  s/p CABG x2 (2002), multiple subsequent PCI's  #Reproducible chest pain  #Paroxysmal atrial fibrillation on Eliquis Patient presents with 8/10 chest pressure that for started when he was sweeping the floor on 9/21 and went away with rest without medication.  The pain reoccurred several hours later that woke him from his sleep, unrelieved with sublingual nitroglycerin, only better after IV fentanyl in the ED.  His troponins are negative on 2 repeats at 17-15 and EKG is without ischemic changes and virtually unchanged from 07/15/2022.  Ultimately, his chest pain is reproducible to palpation which strongly suggests a noncardiac cause despite his known significant coronary disease.  Additionally, his last dose of Eliquis was this morning (which would need to be held for at least 48 hours prior to any procedure). -S/p 325 mg aspirin, continue 81 mg aspirin daily -Continue diltiazem CD 180 mg daily for rate control with his A-fib -Increase isosorbide from 120 mg once a day to 180 mg once a day -Continue Ranexa 1000 mg twice daily -Continue lisinopril 5 mg once a day -Defer beta-blocker with relative bradycardia since admission -Recommend outpatient follow-up with his regular cardiologist. -No further cardiac diagnostics recommended, he is okay for discharge from a cardiac standpoint today.  #Chronic HFpEF BNP slightly elevated at 300, although he appears euvolemic on exam and chest x-ray without significant interstitial edema and no pleural effusions.   Continue GDMT with lisinopril, torsemide 50 mg once a day  #COPD Not acutely exacerbated, management per primary team  This patient's plan of care was discussed and created with Dr. [name] and he is in agreement.  Signed: Tristan Schroeder , PA-C 08/07/2022, 9:45 AM [facility]  The patient has returned again with acute on chronic chest discomfort for which appears to be reproducible with palpation.  This is very consistent with his previous chest discomfort and admissions.  He is maximized on appropriate medication management at this time and therefore will be unable to essentially change course.  There is been no amenable PCI and stent placement ability in the recent past.  There is no evidence of congestive heart failure at this time.  Therefore would continue medication management and discharge when ambulating and feeling better.  I have personally reviewed EKG, chest x-ray, laboratory work, telemetry  The patient has been interviewed and examined. I agree with assessment and plan above. [name] MD [facility]

## 2022-08-07 NOTE — Assessment & Plan Note (Signed)
>>  ASSESSMENT AND PLAN FOR TYPE 2 DIABETES MELLITUS WITHOUT COMPLICATIONS WRITTEN ON 08/07/2022  6:10 AM BY MANSY, JAN A, MD  - The patient will be placed on supplement coverage with NovoLog. - We will hold off metformin. - We will continue [place].

## 2022-08-07 NOTE — H&P (Signed)
[place]   PATIENT NAME: Lawrence [name]    MR#:  [id number]  DATE OF BIRTH:  1956-08-14  DATE OF ADMISSION:  08/07/2022  PRIMARY CARE PHYSICIAN: Birdie Sons, MD   Patient is coming from: Home  REQUESTING/REFERRING PHYSICIAN: Vladimir Crofts, MD  CHIEF COMPLAINT:   Chief Complaint  Patient presents with   Chest Pain    HISTORY OF PRESENT ILLNESS:  TORIS LAVERDIERE is a 66 y.o. Caucasian male with medical history significant for atrial fibrillation on Eliquis, coronary artery disease status post two-vessel CABG, status post PCI and stents, celiac disease, chronic diastolic CHF, COPD, DDD, diverticulosis, GERD, hypertension and type 2 diabetes mellitus, who presented to the ER with cute onset of midsternal chest pain that started few hours before he came to the ER with nausea without vomiting or diaphoresis.  He denied any radiation of his pain..  It has been fairly constant for 3 to 4 hours without relief with sublingual nitroglycerin.  The patient took 4 baby aspirin before coming to the ER.  He denies any dyspnea or palpitations.  No headache or dizziness or blurred vision.  No presyncope or syncope.  No bleeding diathesis.  He denies any dysuria, oliguria, hematuria urgency or frequency or flank pain.  No cough or wheezing or hemoptysis.  No leg pain or any edema or recent travels or surgeries.  He denies any orthopnea or dyspnea on exertion or paroxysmal nocturnal dyspnea.  ED Course: When he came to the ER, BP was 170/139 and later  164/108 with respiratory rate of 22 and otherwise unremarkable vital signs.  Labs revealed CMP revealing blood glucose of 154 and total protein of 6.3 with albumin of 3.6.  BNP was 313.6.  High sensitive troponin I was 17 and later 15.  CBC was within normal EKG as reviewed by me : EKG showed atrial fibrillation with controlled ventricular sponsor 58 with Q waves inferiorly and poor R wave progression with flattened T waves laterally. Imaging:  Portable chest x-ray showed no acute cardiopulmonary disease.  The patient was given a dose 75 mcg of IV fentanyl.  He will be admitted to an observation cardiac telemetry bed for further evaluation and management. PAST MEDICAL HISTORY:   Past Medical History:  Diagnosis Date   A-fib ([place])    Anemia    Anginal pain ([place])    Anxiety    Arthritis    Asthma    CAD (coronary artery disease)    a. 2002 CABGx2 (LIMA->LAD, VG->VG->OM1);  b. 09/2012 DES->OM;  c. 03/2015 PTCA of LAD [facility]) in setting of atretic LIMA; d. 05/2015 Cath [facility]): nonobs dzs; e. 06/2015 Cath ([facility]): LM nl, LAD 45p/d ISR, 50d, D1/2 small, LCX 50p/d ISR, OM1 70ost, 30 ISR, VG->OM1 50ost, 40m LIMA->LAD 99p/d - atretic, RCA dom, nl; f.cath 10/16: 40-50%(FFR 0.90) pLAD, 75% (FFR 0.77) mLAD s/p PCI/DES, oRCA 40% (FFR0.95)   Cancer (HCC)    SKIN CANCER ON BACK   Celiac disease    Chronic diastolic CHF (congestive heart failure) ([place]    a. 06/2009 Echo: EF 60-65%, Gr 1 DD, triv AI, mildly dil LA, nl RV.   COPD (chronic obstructive pulmonary disease) ([place]    a. Chronic bronchitis and emphysema.   DDD (degenerative disc disease), lumbar    Diverticulosis    Dysrhythmia    Essential hypertension    GERD (gastroesophageal reflux disease)    History of hiatal hernia    History of kidney stones  H/O   History of tobacco abuse    a. Quit 2014.   Myocardial infarction [facility]) 2002   4 STENTS   Pancreatitis    PSVT (paroxysmal supraventricular tachycardia) ([place])    a. 10/2012 Noted on Zio Patch.   Sleep apnea    LOST CORD TO CPAP -ONLY 02 @ BEDTIME   Tubular adenoma of colon    Type II diabetes mellitus ([place])     PAST SURGICAL HISTORY:   Past Surgical History:  Procedure Laterality Date   BYPASS GRAFT     CARDIAC CATHETERIZATION N/A 07/12/2015   rocedure: Left Heart Cath and Cors/Grafts Angiography;  Surgeon: Belva Crome, MD;  Location: [place] CV LAB;  Service: Cardiovascular;  Laterality: N/A;   CARDIAC  CATHETERIZATION Right 10/07/2015   Procedure: Left Heart Cath and Cors/Grafts Angiography;  Surgeon: Dionisio David, MD;  Location: [place] CV LAB;  Service: Cardiovascular;  Laterality: Right;   CARDIAC CATHETERIZATION N/A 04/06/2016   Procedure: Left Heart Cath and Coronary Angiography;  Surgeon: Yolonda Kida, MD;  Location: [place] [facility];  Service: Cardiovascular;  Laterality: N/A;   CARDIAC CATHETERIZATION  04/06/2016   Procedure: Bypass Graft Angiography;  Surgeon: Yolonda Kida, MD;  Location: [place] CV LAB;  Service: Cardiovascular;;   CARDIAC CATHETERIZATION N/A 11/02/2016   Procedure: Left Heart Cath and Cors/Grafts Angiography and possible PCI;  Surgeon: Yolonda Kida, MD;  Location: [place] CV LAB;  Service: Cardiovascular;  Laterality: N/A;   CARDIAC CATHETERIZATION N/A 11/02/2016   Procedure: Coronary Stent Intervention;  Surgeon: Yolonda Kida, MD;  Location: [place] CV LAB;  Service: Cardiovascular;  Laterality: N/A;   CARDIOVERSION N/A 06/29/2022   Procedure: CARDIOVERSION;  Surgeon: Corey Skains, MD;  Location: [facility] ORS;  Service: Cardiovascular;  Laterality: N/A;   CHOLECYSTECTOMY     CIRCUMCISION N/A 06/09/2019   Procedure: CIRCUMCISION ADULT;  Surgeon: Billey Co, MD;  Location: [facility] ORS;  Service: Urology;  Laterality: N/A;   COLONOSCOPY WITH PROPOFOL N/A 04/01/2018   Procedure: COLONOSCOPY WITH PROPOFOL;  Surgeon: Manya Silvas, MD;  Location: [facility] ENDOSCOPY;  Service: Endoscopy;  Laterality: N/A;   ESOPHAGEAL DILATION     ESOPHAGOGASTRODUODENOSCOPY (EGD) WITH PROPOFOL N/A 04/01/2018   Procedure: ESOPHAGOGASTRODUODENOSCOPY (EGD) WITH PROPOFOL;  Surgeon: Manya Silvas, MD;  Location: [facility] ENDOSCOPY;  Service: Endoscopy;  Laterality: N/A;   LEFT HEART CATH AND CORS/GRAFTS ANGIOGRAPHY N/A 06/12/2019   Procedure: LEFT HEART CATH AND CORS/GRAFTS ANGIOGRAPHY;  Surgeon: Teodoro Spray, MD;  Location: [place] CV  LAB;  Service: Cardiovascular;  Laterality: N/A;   LEFT HEART CATH AND CORS/GRAFTS ANGIOGRAPHY N/A 03/11/2020   Procedure: LEFT HEART CATH AND CORS/GRAFTS ANGIOGRAPHY;  Surgeon: Isaias Cowman, MD;  Location: [place] CV LAB;  Service: Cardiovascular;  Laterality: N/A;   LEFT HEART CATH AND CORS/GRAFTS ANGIOGRAPHY N/A 05/01/2021   Procedure: LEFT HEART CATH AND CORS/GRAFTS ANGIOGRAPHY;  Surgeon: Corey Skains, MD;  Location: [place] CV LAB;  Service: Cardiovascular;  Laterality: N/A;   TONSILLECTOMY     VASCULAR SURGERY      SOCIAL HISTORY:   Social History   Tobacco Use   Smoking status: Former    Packs/day: 3.00    Years: 50.00    Total pack years: 150.00    Types: Cigarettes    Quit date: 04/22/2013    Years since quitting: 9.2   Smokeless tobacco: Never   Tobacco comments:    Reports not smoking for  approx 8 years.  Substance Use Topics   Alcohol use: No    Comment: remotely quit alcohol use. Hx of heavy alcohol use.    FAMILY HISTORY:   Family History  Problem Relation Age of Onset   Heart attack Mother    Depression Mother    Heart disease Mother    COPD Mother    Hypertension Mother    Heart attack Father    Diabetes Father    Depression Father    Heart disease Father    Cirrhosis Father    Parkinson's disease Brother     DRUG ALLERGIES:   Allergies  Allergen Reactions   Demerol  [Meperidine Hcl]    Demerol [Meperidine] Hives   Jardiance [Empagliflozin] Other (See Comments)    Perineal pain   Prednisone Other (See Comments) and Hypertension    Pt states that this medication puts him in A-fib    Sulfa Antibiotics Hives   Albuterol Sulfate [Albuterol] Palpitations and Other (See Comments)    Pt currently uses this medication.     Morphine Sulfate Nausea And Vomiting, Rash and Other (See Comments)    Pt states that he is only allergic to the tablet form of this medication.      REVIEW OF SYSTEMS:   ROS As per history of present  illness. All pertinent systems were reviewed above. Constitutional, HEENT, cardiovascular, respiratory, GI, GU, musculoskeletal, neuro, psychiatric, endocrine, integumentary and hematologic systems were reviewed and are otherwise negative/unremarkable except for positive findings mentioned above in the HPI.   MEDICATIONS AT HOME:   Prior to Admission medications   Medication Sig Start Date End Date Taking? Authorizing Provider  Accu-Chek Softclix Lancets lancets Use as instructed to check sugar daily for type 2 diabetes. 03/03/21   Birdie Sons, MD  albuterol (VENTOLIN HFA) 108 (90 Base) MCG/ACT inhaler INHALE 2 PUFFS BY MOUTH EVERY 6 HOURS AS NEEDED FOR SHORTNESS OF BREATH 12/02/21   Birdie Sons, MD  allopurinol (ZYLOPRIM) 300 MG tablet TAKE 1 TABLET BY MOUTH TWICE A DAY 10/15/21   Birdie Sons, MD  ALPRAZolam Duanne Moron) 1 MG tablet Take 1 tablet (1 mg total) by mouth 3 (three) times daily. 06/18/22   Birdie Sons, MD  amiodarone (PACERONE) 200 MG tablet Take 200 mg by mouth 2 (two) times daily. 07/06/22   [provider]  aspirin 81 MG chewable tablet Chew 81 mg by mouth daily. Swallows whole    [provider]  atorvastatin (LIPITOR) 80 MG tablet TAKE 1 TABLET BY MOUTH AT BEDTIME 04/16/22   Birdie Sons, MD  BELSOMRA 5 MG TABS TAKE 1 TABLET (5 MG TOTAL) BY MOUTH AT BEDTIME AS NEEDED 06/17/22   Birdie Sons, MD  Blood Glucose Calibration (ACCU-CHEK GUIDE CONTROL) LIQD Use with blood glucose monitor as directed 02/20/21   Birdie Sons, MD  Blood Glucose Monitoring Suppl (ACCU-CHEK GUIDE) w/Device KIT Use to check blood sugars as directed 02/20/21   Birdie Sons, MD  celecoxib (CELEBREX) 200 MG capsule Take 1 capsule by mouth 2 (two) times daily. 11/03/21   [provider]  cetirizine (ZYRTEC) 10 MG tablet TAKE 1 TABLET BY MOUTH AT BEDTIME 04/16/22   Birdie Sons, MD  diltiazem (CARDIZEM CD) 180 MG 24 hr capsule Take 1 capsule (180 mg total) by mouth  daily. 07/09/22   Lorella Nimrod, MD  docusate sodium (COLACE) 100 MG capsule Take 100 mg by mouth daily as needed for mild constipation.  [provider]  ELIQUIS 5 MG TABS tablet TAKE 1 TABLET BY MOUTH TWICE A DAY 07/30/22   Birdie Sons, MD  FARXIGA 10 MG TABS tablet TAKE 1 TABLET BY MOUTH DAILY 02/09/22   Birdie Sons, MD  fluticasone furoate-vilanterol (BREO ELLIPTA) 100-25 MCG/INH AEPB INHALE 1 PUFF BY MOUTH INTO THE LUNGS DAILY. 07/18/21   Birdie Sons, MD  glucose blood (ACCU-CHEK GUIDE) test strip Use as instructed to check sugar daily for type 2 diabetes. 03/03/21   Birdie Sons, MD  isosorbide mononitrate (IMDUR) 120 MG 24 hr tablet TAKE 1 TABLET BY MOUTH DAILY 08/20/21   Birdie Sons, MD  linaclotide [facility]) 72 MCG capsule Take 72 mcg by mouth daily before breakfast.    [provider]  lisinopril (ZESTRIL) 10 MG tablet TAKE 1 TABLET BY MOUTH DAILY 07/30/22   Birdie Sons, MD  magnesium oxide (MAG-OX) 400 MG tablet Take 1 tablet by mouth daily. 03/23/17   [provider]  meclizine (ANTIVERT) 25 MG tablet Take 1 tablet (25 mg total) by mouth 3 (three) times daily as needed for dizziness. 07/16/22   Ward, Delice Bison, DO  metFORMIN (GLUCOPHAGE-XR) 500 MG 24 hr tablet TAKE 1 TABLET BY MOUTH TWICE A DAY 08/28/21   Birdie Sons, MD  methocarbamol (ROBAXIN) 500 MG tablet TAKE 1 TABLET BY MOUTH EVERY 8 HOURS AS NEEDED FOR MUSCLE SPASMS 03/02/22   Birdie Sons, MD  naloxone [facility]) nasal spray 4 mg/0.1 mL 1 spray into nostril x1 and may repeat every 2-3 minutes until patient is responsive or EMS arrives 08/21/20   Birdie Sons, MD  nitroGLYCERIN (NITROSTAT) 0.4 MG SL tablet PLACE ONE TABLET UNDER THE TONGUE EVERY 5 MINUTES FOR CHEST PAIN. IF NO RELIEF PAST 3RD TAB GO TO EMERGENCY ROOM 12/02/21   Birdie Sons, MD  omega-3 acid ethyl esters (LOVAZA) 1 g capsule TAKE 4 CAPSULES BY MOUTH DAILY 08/20/21   Birdie Sons, MD  omeprazole  (PRILOSEC) 40 MG capsule TAKE 1 CAPSULE BY MOUTH 2 TIMES DAILY 30 MINUTES BEFORE BREAKFAST AND DINNER 07/15/22   Birdie Sons, MD  ondansetron (ZOFRAN-ODT) 4 MG disintegrating tablet Take 1 tablet (4 mg total) by mouth every 8 (eight) hours as needed for nausea or vomiting. 04/08/22   Birdie Sons, MD  oxyCODONE-acetaminophen (PERCOCET) 10-325 MG tablet TAKE 1 TABLET BY MOUTH EVERY 4 HOURS AS NEEDED FOR PAIN 07/16/22   Birdie Sons, MD  ranolazine (RANEXA) 1000 MG SR tablet Take 1 tablet (1,000 mg total) by mouth 2 (two) times daily. 04/02/21   Birdie Sons, MD  SPIRIVA HANDIHALER 18 MCG inhalation capsule PLACE 1 CAPSULE INTO INHALER AND INHALE BY MOUTH ONCE A DAY. 06/02/22   Birdie Sons, MD  torsemide (DEMADEX) 100 MG tablet Take 0.5 tablets (50 mg total) by mouth daily. 05/13/20   Birdie Sons, MD  traZODone (DESYREL) 150 MG tablet TAKE 1 TABLET BY MOUTH AT BEDTIME 09/01/21   Birdie Sons, MD  venlafaxine XR (EFFEXOR-XR) 75 MG 24 hr capsule TAKE 1 CAPSULE BY MOUTH DAILY WITH BREAKFAST 12/16/21   Birdie Sons, MD      VITAL SIGNS:  Blood pressure 132/82, pulse 65, temperature 97.8 F (36.6 C), temperature source Oral, resp. rate 12, height 5' 7"  (1.702 m), weight 82.1 kg, SpO2 96 %.  PHYSICAL EXAMINATION:  Physical Exam  GENERAL:  66 y.o.-year-old patient lying in the bed with no acute distress.  EYES: Pupils equal, round, reactive to light and accommodation. No scleral icterus. Extraocular muscles intact.  HEENT: Head atraumatic, normocephalic. Oropharynx and nasopharynx clear.  NECK:  Supple, no jugular venous distention. No thyroid enlargement, no tenderness.  LUNGS: Normal breath sounds bilaterally, no wheezing, rales,rhonchi or crepitation. No use of accessory muscles of respiration.  CARDIOVASCULAR: Irregularly irregular rhythm, S1, S2 normal. No murmurs, rubs, or gallops.  ABDOMEN: Soft, nondistended, nontender. Bowel sounds present. No organomegaly or mass.   EXTREMITIES: No pedal edema, cyanosis, or clubbing.  NEUROLOGIC: Cranial nerves II through XII are intact. Muscle strength 5/5 in all extremities. Sensation intact. Gait not checked.  PSYCHIATRIC: The patient is alert and oriented x 3.  Normal affect and good eye contact. SKIN: No obvious rash, lesion, or ulcer.   LABORATORY PANEL:   CBC Recent Labs  Lab 08/07/22 0101  WBC 7.6  HGB 15.8  HCT 50.3  PLT 190   ------------------------------------------------------------------------------------------------------------------  Chemistries  Recent Labs  Lab 08/07/22 0101  NA 143  K 4.3  CL 106  CO2 31  GLUCOSE 154*  BUN 12  CREATININE 0.73  CALCIUM 9.1  AST 14*  ALT 12  ALKPHOS 76  BILITOT 0.7   ------------------------------------------------------------------------------------------------------------------  Cardiac Enzymes No results for input(s): "TROPONINI" in the last 168 hours. ------------------------------------------------------------------------------------------------------------------  RADIOLOGY:  DG Chest Portable 1 View  Result Date: 08/07/2022 CLINICAL DATA:  Sharp chest pain EXAM: PORTABLE CHEST 1 VIEW COMPARISON:  07/15/2022 FINDINGS: Stable heart size and mediastinal contours. Prior median sternotomy and coronary stenting. Interstitial coarsening and without Kerley line, effusion, or air bronchogram. IMPRESSION: Negative for edema or pneumonia. Electronically Signed   By: Jorje Guild M.D.   On: 08/07/2022 05:19      IMPRESSION AND PLAN:  Assessment and Plan: * Chest pain - The patient will be admitted to an observation cardiac telemetry bed. - We will follow serial troponins. - The patient will be placed on as needed sublingual nitroglycerin and IV morphine sulfate for pain as well as aspirin. - Statin therapy will be provided and fasting lipids will be checked. - We will resume beta-blocker therapy. - Cardiology consult will be obtained. - I  notified Dr. [name] about patient.  CAD (coronary artery disease) - We will continue aspirin, Ranexa, beta-blocker and ACE inhibitor therapy.  In addition to [facility] and statin therapy.  Chronic diastolic HF (heart failure) (HCC) - We will continue diuretic therapy. - He does not seem to be in clinical acute exacerbation.  Type 2 diabetes mellitus without complications ([place]) - The patient will be placed on supplement coverage with NovoLog. - We will hold off metformin. - We will continue [place].  Atrial fibrillation, chronic (HCC) - We will continue amiodarone and Cardizem. - We will continue Eliquis.  Chronic obstructive pulmonary disease (COPD) (HCC) - We will continue has albuterol and hold off long-acting beta agonist.   DVT prophylaxis: Eliquis. Advanced Care Planning:  Code Status: full code.  Family Communication:  The plan of care was discussed in details with the patient (and family). I answered all questions. The patient agreed to proceed with the above mentioned plan. Further management will depend upon hospital course. Disposition Plan: Back to previous home environment Consults called: Cardiology. All the records are reviewed and case discussed with ED provider.  Status is: Observation  I certify that at the time of admission, it is my clinical judgment that the patient will require  hospital care extending less than 2 midnights.  Dispo: The patient is from: Home              Anticipated d/c is to: Home              Patient currently is not medically stable to d/c.              Difficult to place patient: No  Christel Mormon M.D on 08/07/2022 at 6:10 AM  [facility]   From 7 PM-7 AM, contact night-coverage [email or web address]  CC: Primary care physician; Birdie Sons, MD

## 2022-08-08 IMAGING — CR DG CHEST 2V
1 series · 2 of 2 positions shown · non-contrast
Comparison: 08/10/2020

CLINICAL DATA: Chest pain

EXAM:
CHEST - 2 VIEW

[Series 1: w chest pa · 0.14mm/px · 2 of 2 slices shown]
[im 1/2]
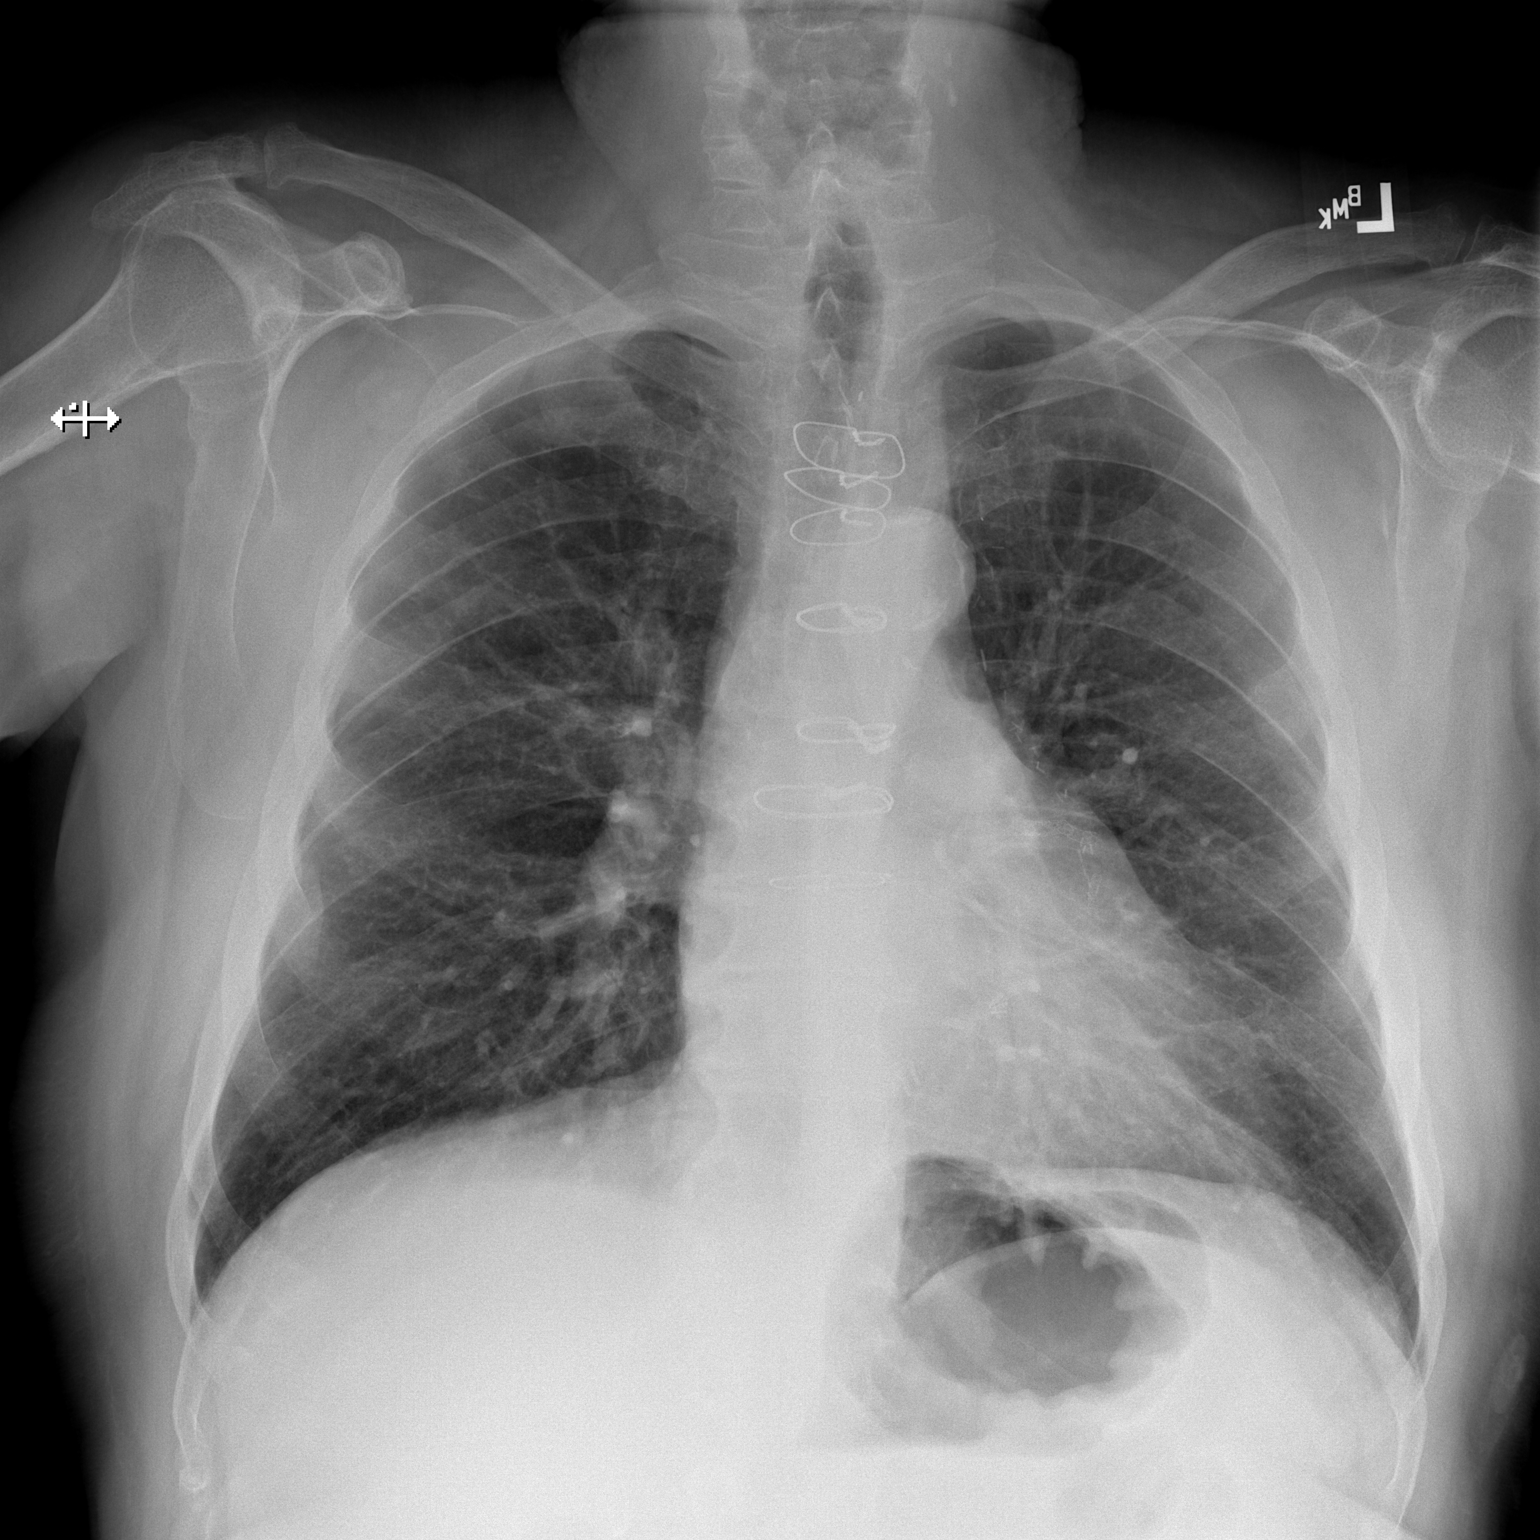
[im 2/2]
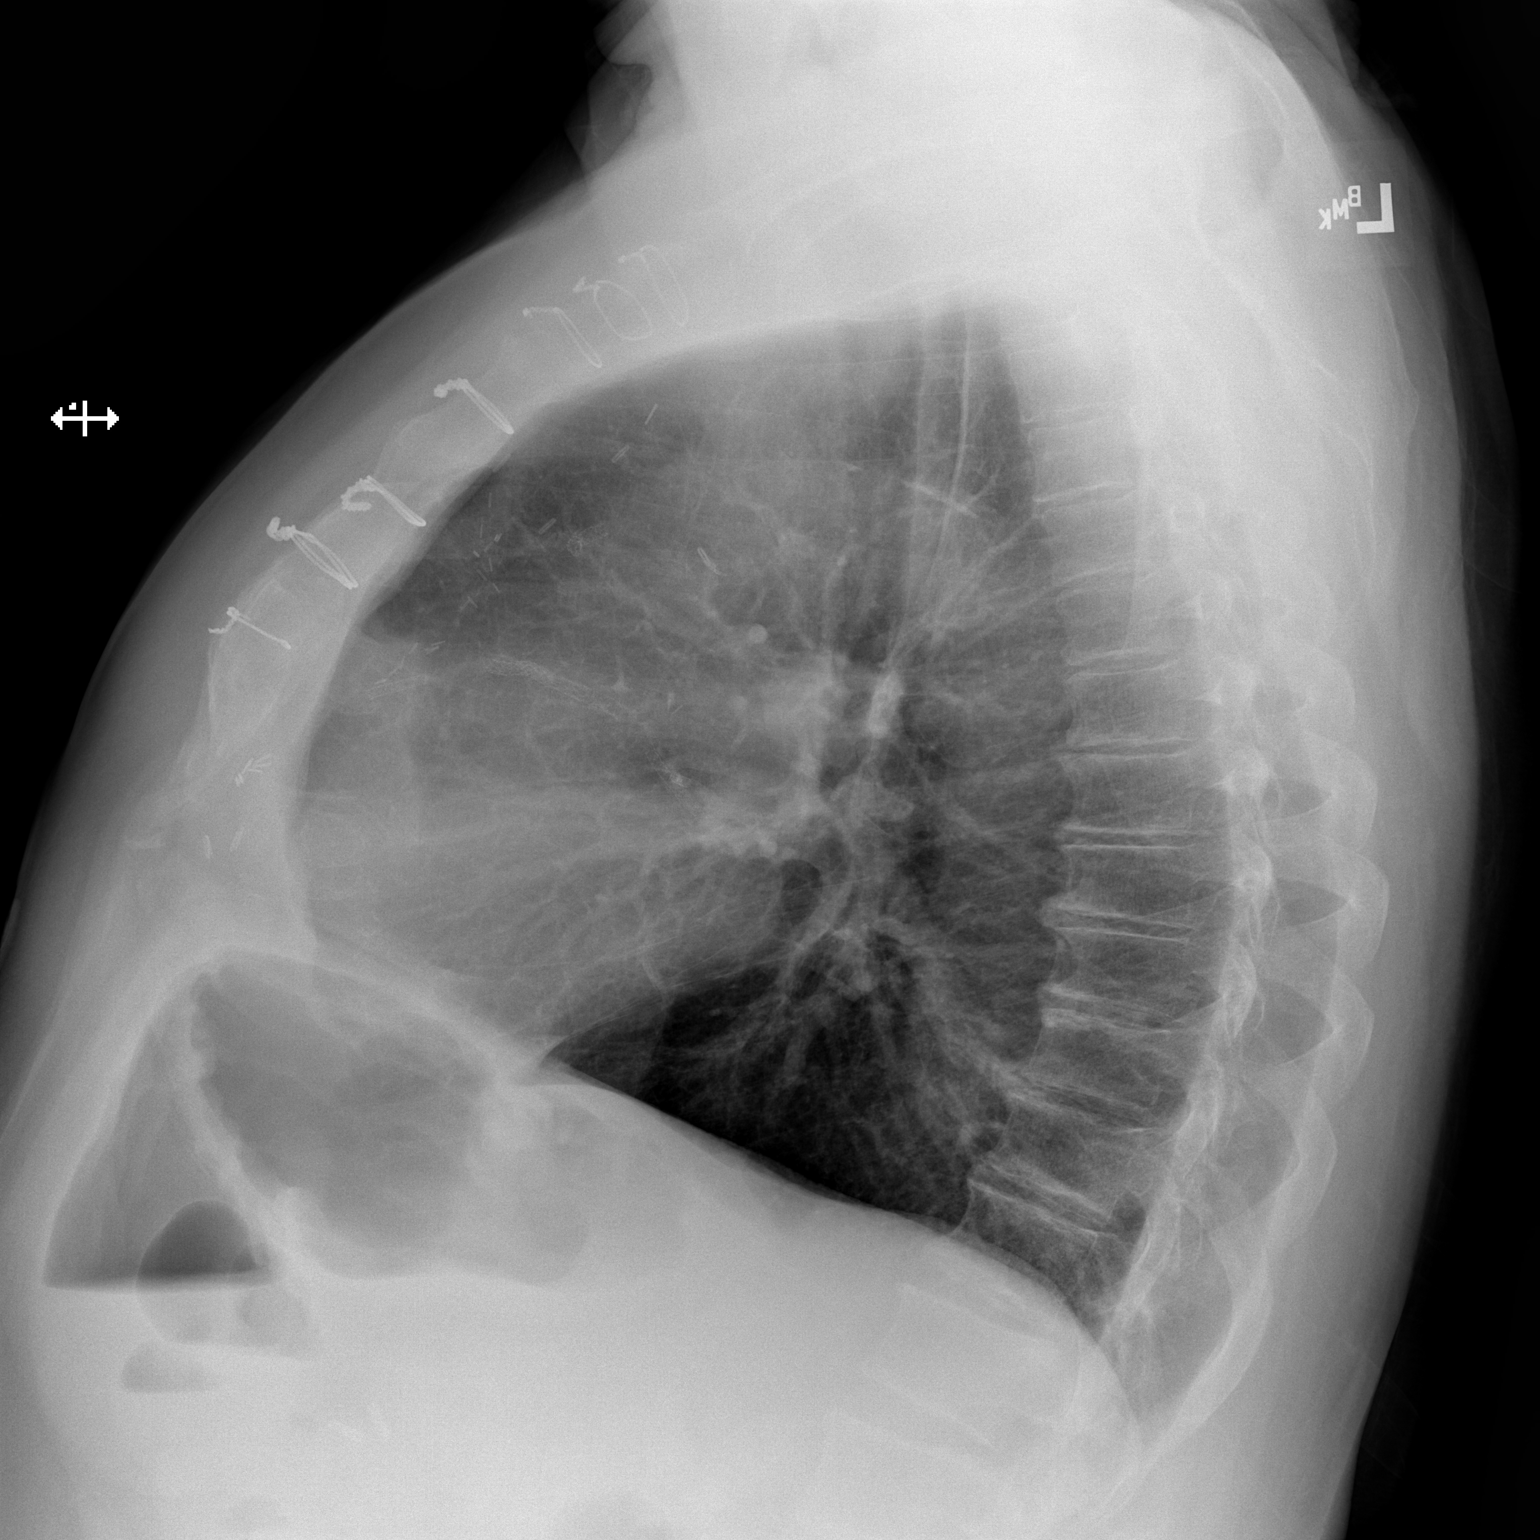

[2 of 2 positions shown; findings below may reference images not displayed]

FINDINGS: Post sternotomy changes. No focal opacity or pleural effusion.
Stable cardiomediastinal silhouette with aortic atherosclerosis. No
pneumothorax.
IMPRESSION: No active cardiopulmonary disease.

## 2022-08-13 ENCOUNTER — Other Ambulatory Visit: Payer: Self-pay | Admitting: Family Medicine

## 2022-08-13 DIAGNOSIS — M5410 Radiculopathy, site unspecified: Secondary | ICD-10-CM

## 2022-08-18 IMAGING — CR DG CHEST 2V
1 series · 2 of 2 positions shown · non-contrast
Comparison: September 26, 2020

CLINICAL DATA: Chest pain

EXAM:
CHEST - 2 VIEW

[Series 1: dg chest 2 view · 0.14mm/px · 2 of 2 slices shown]
[im 1/2]
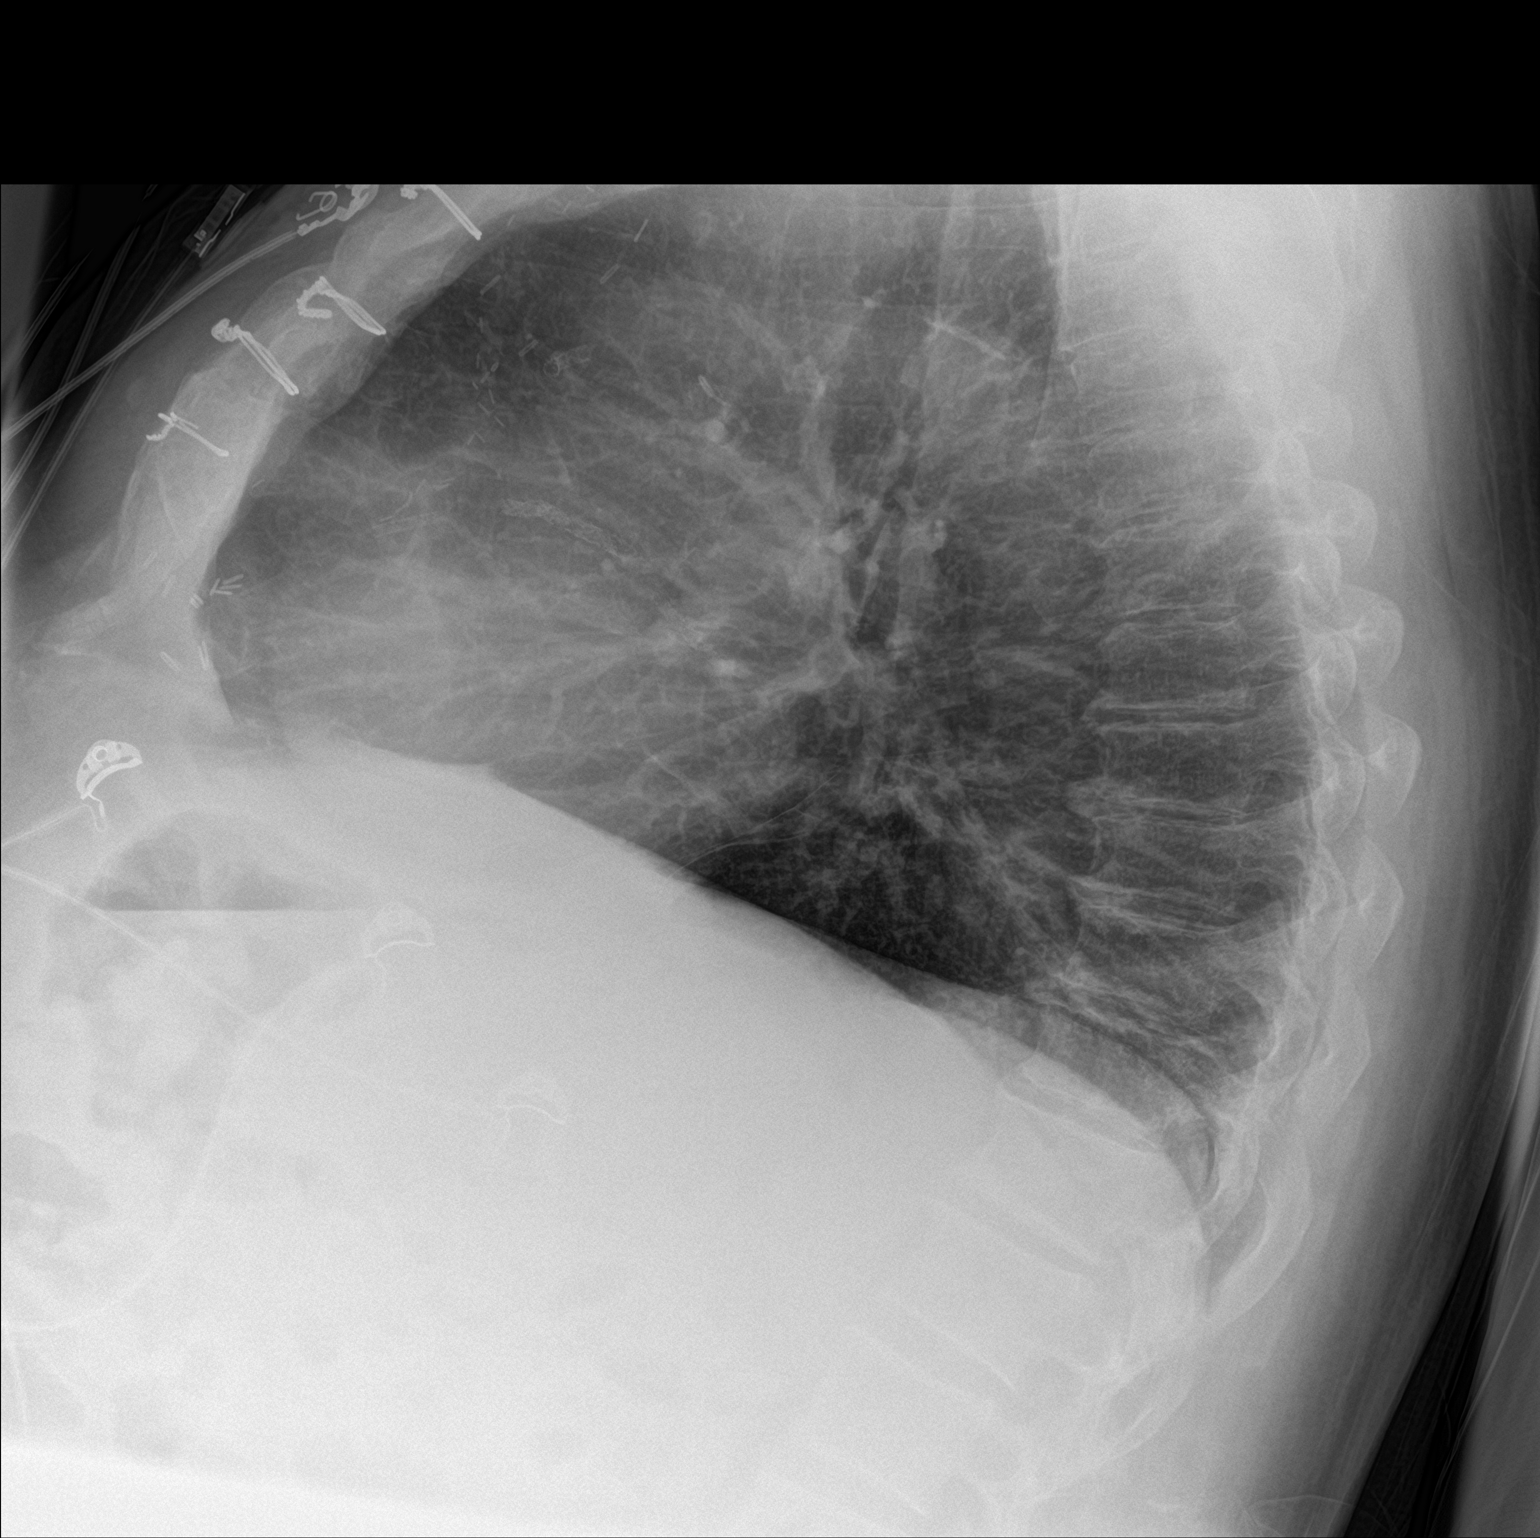
[im 2/2]
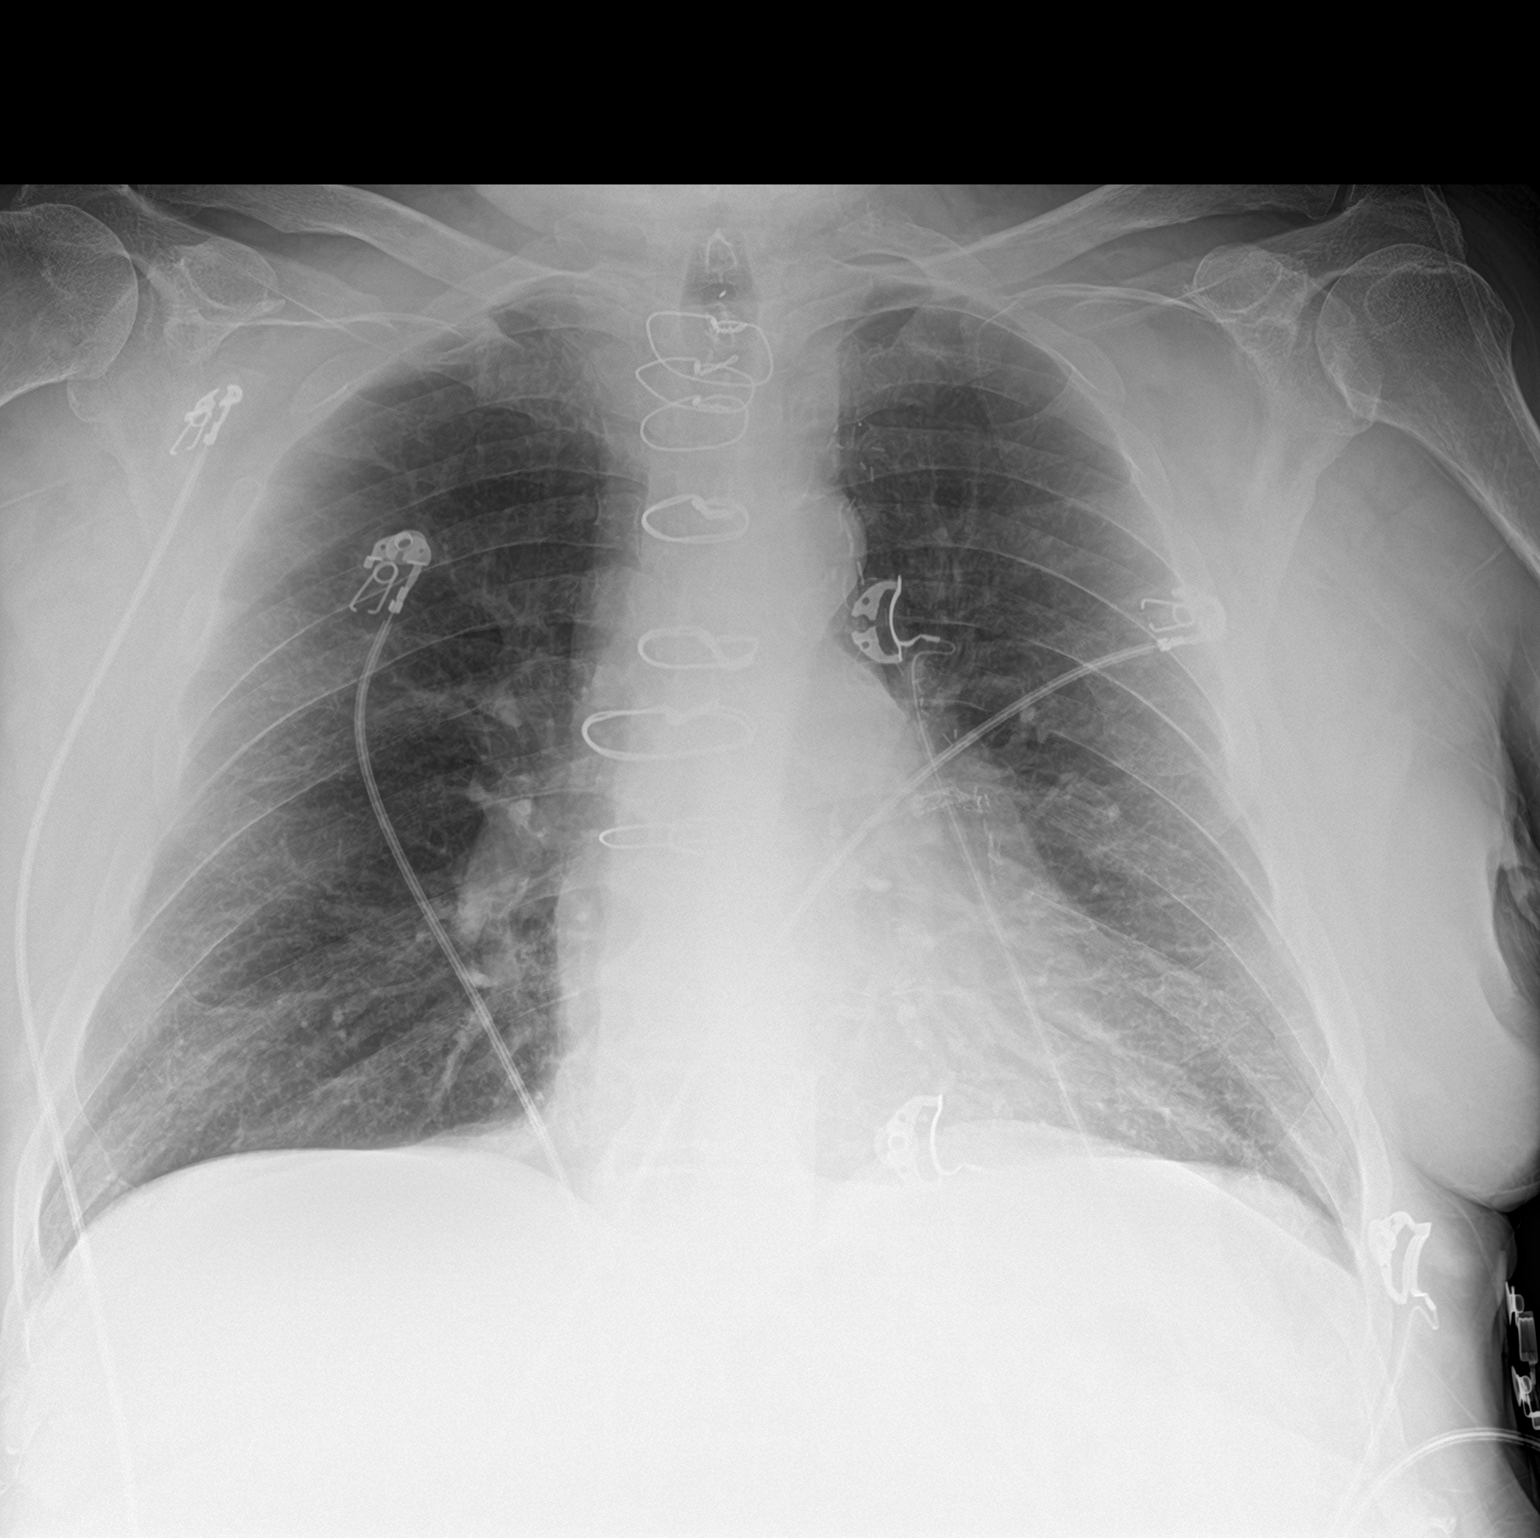

[2 of 2 positions shown; findings below may reference images not displayed]

FINDINGS: Cardiomediastinal silhouette is normal. No pneumothorax. The hila
and mediastinum are normal. No focal infiltrates. No overt edema.
IMPRESSION: No active cardiopulmonary disease.

## 2022-08-25 ENCOUNTER — Telehealth: Payer: Self-pay | Admitting: Family Medicine

## 2022-08-25 ENCOUNTER — Telehealth: Payer: Self-pay

## 2022-08-25 NOTE — Telephone Encounter (Signed)
Updated order for oxygen needed, they need to know the current liter flow and which meter they are using -Disregard caller realized this was a duplicate request

## 2022-08-25 NOTE — Telephone Encounter (Signed)
Copied from [place] [id number]. Topic: General - Other >> Aug 25, 2022 10:10 AM Ludger Nutting wrote: [facility] called to request an oxygen order with current liter flow and diagnose. Please advise.

## 2022-08-26 NOTE — Telephone Encounter (Signed)
Noted  

## 2022-08-26 NOTE — Telephone Encounter (Signed)
Please clarify. Patient was on oxygen, then got off, but I think he's back on it now and I don't know the flow rate. Are they going to need recent oximetry on and off oxygen to qualify him for O2. If so then he'll need o.v. since we haven't done that recently.

## 2022-08-31 ENCOUNTER — Other Ambulatory Visit: Payer: Self-pay | Admitting: Family Medicine

## 2022-09-01 NOTE — Telephone Encounter (Signed)
Pt called back, advised of Dr. [name] message. Scheduled appt for 09/10/22 at 1340 with [place], [place]. Dr. [name] didn't have any appts for that day.

## 2022-09-10 ENCOUNTER — Encounter: Payer: Self-pay | Admitting: Physician Assistant

## 2022-09-10 ENCOUNTER — Telehealth: Payer: Self-pay | Admitting: Family Medicine

## 2022-09-10 ENCOUNTER — Ambulatory Visit (INDEPENDENT_AMBULATORY_CARE_PROVIDER_SITE_OTHER): Payer: Medicare HMO | Admitting: Physician Assistant

## 2022-09-10 VITALS — BP 149/99 | HR 72 | Ht 67.0 in | Wt 207.6 lb

## 2022-09-10 DIAGNOSIS — J9611 Chronic respiratory failure with hypoxia: Secondary | ICD-10-CM | POA: Diagnosis not present

## 2022-09-10 DIAGNOSIS — Z23 Encounter for immunization: Secondary | ICD-10-CM

## 2022-09-10 DIAGNOSIS — I5032 Chronic diastolic (congestive) heart failure: Secondary | ICD-10-CM | POA: Diagnosis not present

## 2022-09-10 MED ORDER — NITROGLYCERIN 0.4 MG SL SUBL: 0.4000 mg | SUBLINGUAL_TABLET | SUBLINGUAL | 0 refills | Status: DC | PRN

## 2022-09-10 MED ORDER — OMEGA-3-ACID ETHYL ESTERS 1 G PO CAPS: 4.0000 | ORAL_CAPSULE | Freq: Every day | ORAL | 3 refills | Status: DC

## 2022-09-10 MED ORDER — TORSEMIDE 100 MG PO TABS: 50.0000 mg | ORAL_TABLET | Freq: Every day | ORAL | 0 refills | Status: DC

## 2022-09-10 NOTE — Assessment & Plan Note (Deleted)
Pulled for refills

## 2022-09-10 NOTE — Assessment & Plan Note (Signed)
Pt uses 2 L continuous at night. At rest, lowest O2 dropped w/ 97% RA. While ambulating, O2 stayed at 100-99% on RA. With this data, unable to get portable O2 approved. Advised he discuss further w/ pcp or can refer to pulmonology if desired.

## 2022-09-10 NOTE — Assessment & Plan Note (Signed)
Pulled for refills

## 2022-09-10 NOTE — Telephone Encounter (Signed)
Pt called to advised that Ria Comment forgot to send refill for oxyCODONE-acetaminophen (PERCOCET) 10-325 MG tablet when she filled his other meds / please advise    [place], [address]  [place], [place] [address]  Phone:  [phone]  Fax:  [phone]  DEA #:  --

## 2022-09-10 NOTE — Progress Notes (Signed)
I,Sha'taria Tyson,acting as a Education administrator for [employer], PA-C.,have documented all relevant documentation on the behalf of Mikey Kirschner, PA-C,as directed by  Mikey Kirschner, PA-C while in the presence of Mikey Kirschner, PA-C.   Established patient visit   Patient: Lawrence [name]   DOB: [birth year]   66 y.o. Male  MRN: [id number] Visit Date: 09/10/2022  Today's healthcare provider: Mikey Kirschner, PA-C   Cc. Portable oxygen   Subjective    HPI   Pt reports his portable O2 machine is broken. Insurance would not cover a replacement without repeat O2 testing. Denies any changes in SOB or cough.  He uses 2 L continuous O2 at night.  Medications: Outpatient Medications Prior to Visit  Medication Sig   Accu-Chek Softclix Lancets lancets Use as instructed to check sugar daily for type 2 diabetes.   acetaminophen (TYLENOL) 325 MG tablet Take 2 tablets (650 mg total) by mouth every 4 (four) hours as needed for headache or mild pain.   albuterol (VENTOLIN HFA) 108 (90 Base) MCG/ACT inhaler INHALE 2 PUFFS BY MOUTH EVERY 6 HOURS AS NEEDED FOR SHORTNESS OF BREATH   allopurinol (ZYLOPRIM) 300 MG tablet TAKE 1 TABLET BY MOUTH TWICE A DAY   ALPRAZolam (XANAX) 1 MG tablet Take 1 tablet (1 mg total) by mouth 3 (three) times daily.   amiodarone (PACERONE) 200 MG tablet Take 200 mg by mouth 2 (two) times daily.   aspirin 81 MG chewable tablet Chew 81 mg by mouth daily. Swallows whole   methocarbamol (ROBAXIN) 500 MG tablet Take 500 mg by mouth every 8 (eight) hours as needed for muscle spasms. Take 1 tablet by mouth every hour as needed for muscle spasms   atorvastatin (LIPITOR) 80 MG tablet TAKE 1 TABLET BY MOUTH AT BEDTIME   BELSOMRA 5 MG TABS TAKE 1 TABLET (5 MG TOTAL) BY MOUTH AT BEDTIME AS NEEDED   Blood Glucose Calibration (ACCU-CHEK GUIDE CONTROL) LIQD Use with blood glucose monitor as directed   Blood Glucose Monitoring Suppl (ACCU-CHEK GUIDE) w/Device KIT Use to check blood sugars as  directed   BREO ELLIPTA 100-25 MCG/ACT AEPB INHALE 1 PUFF BY MOUTH INTO THE LUNGS DAILY.   celecoxib (CELEBREX) 200 MG capsule Take 1 capsule by mouth 2 (two) times daily.   cetirizine (ZYRTEC) 10 MG tablet TAKE 1 TABLET BY MOUTH AT BEDTIME   diltiazem (CARDIZEM CD) 180 MG 24 hr capsule Take 1 capsule (180 mg total) by mouth daily.   docusate sodium (COLACE) 100 MG capsule Take 100 mg by mouth daily as needed for mild constipation. (Patient not taking: Reported on 09/10/2022)   ELIQUIS 5 MG TABS tablet TAKE 1 TABLET BY MOUTH TWICE A DAY   FARXIGA 10 MG TABS tablet TAKE 1 TABLET BY MOUTH DAILY   glucose blood (ACCU-CHEK GUIDE) test strip Use as instructed to check sugar daily for type 2 diabetes.   isosorbide mononitrate (IMDUR) 60 MG 24 hr tablet Take 3 tablets (180 mg total) by mouth daily.   linaclotide (LINZESS) 72 MCG capsule Take 72 mcg by mouth daily before breakfast.   lisinopril (ZESTRIL) 10 MG tablet TAKE 1 TABLET BY MOUTH DAILY   meclizine (ANTIVERT) 25 MG tablet TAKE ONE TABLET BY THREE TIMES A DAY AS NEEDED FOR DIZZINESS   metFORMIN (GLUCOPHAGE-XR) 500 MG 24 hr tablet TAKE 1 TABLET BY MOUTH TWICE A DAY   metoprolol tartrate (LOPRESSOR) 25 MG tablet Take 1 tablet (25 mg total) by mouth 2 (two) times daily.  naloxone (NARCAN) nasal spray 4 mg/0.1 mL 1 spray into nostril x1 and may repeat every 2-3 minutes until patient is responsive or EMS arrives   omeprazole (PRILOSEC) 40 MG capsule TAKE 1 CAPSULE BY MOUTH 2 TIMES DAILY 30 MINUTES BEFORE BREAKFAST AND DINNER   ondansetron (ZOFRAN-ODT) 4 MG disintegrating tablet Take 1 tablet (4 mg total) by mouth every 8 (eight) hours as needed for nausea or vomiting. (Patient not taking: Reported on 09/10/2022)   oxyCODONE-acetaminophen (PERCOCET) 10-325 MG tablet TAKE 1 TABLET BY MOUTH EVERY 4 HOURS AS NEEDED FOR PAIN   ranolazine (RANEXA) 1000 MG SR tablet Take 1 tablet (1,000 mg total) by mouth 2 (two) times daily.   SPIRIVA HANDIHALER 18 MCG  inhalation capsule PLACE 1 CAPSULE INTO INHALER AND INHALE BY MOUTH ONCE A DAY.   traZODone (DESYREL) 150 MG tablet TAKE 1 TABLET BY MOUTH AT BEDTIME   venlafaxine XR (EFFEXOR-XR) 75 MG 24 hr capsule TAKE 1 CAPSULE BY MOUTH DAILY WITH BREAKFAST   [DISCONTINUED] nitroGLYCERIN (NITROSTAT) 0.4 MG SL tablet Place 1 tablet (0.4 mg total) under the tongue every 5 (five) minutes as needed for chest pain.   [DISCONTINUED] omega-3 acid ethyl esters (LOVAZA) 1 g capsule TAKE 4 CAPSULES BY MOUTH DAILY   [DISCONTINUED] torsemide (DEMADEX) 100 MG tablet Take 0.5 tablets (50 mg total) by mouth daily.   No facility-administered medications prior to visit.     Objective    Blood pressure (!) 149/99, pulse 72, height 5' 7"  (1.702 m), weight 207 lb 9.6 oz (94.2 kg), SpO2 100 %.   Physical Exam Constitutional:      General: He is awake.     Appearance: He is well-developed.  HENT:     Head: Normocephalic.  Eyes:     Conjunctiva/sclera: Conjunctivae normal.  Cardiovascular:     Rate and Rhythm: Normal rate and regular rhythm.     Heart sounds: Normal heart sounds.  Pulmonary:     Effort: Pulmonary effort is normal.     Breath sounds: Normal breath sounds.  Skin:    General: Skin is warm.  Neurological:     Mental Status: He is alert and oriented to person, place, and time.  Psychiatric:        Attention and Perception: Attention normal.        Mood and Affect: Mood normal.        Speech: Speech normal.        Behavior: Behavior is cooperative.     No results found for any visits on 09/10/22.  Assessment & Plan     Problem List Items Addressed This Visit       Cardiovascular and Mediastinum   Chronic diastolic CHF (congestive heart failure) (HCC)    Pulled for refills      Relevant Medications   omega-3 acid ethyl esters (LOVAZA) 1 g capsule   nitroGLYCERIN (NITROSTAT) 0.4 MG SL tablet   torsemide (DEMADEX) 100 MG tablet     Respiratory   Chronic respiratory failure with hypoxia  (HCC) - Primary    Pt uses 2 L continuous at night. At rest, lowest O2 dropped w/ 97% RA. While ambulating, O2 stayed at 100-99% on RA. With this data, unable to get portable O2 approved. Advised he discuss further w/ pcp or can refer to pulmonology if desired.      Other Visit Diagnoses     Need for immunization against influenza       Relevant Orders   Flu Vaccine QUAD  High Dose(Fluad) (Completed)   Need for vaccination against Streptococcus pneumoniae       Relevant Orders   Pneumococcal conjugate vaccine 20-valent (Prevnar 20) (Completed)        Return in about 2 months (around 11/10/2022) for chronic conditions w/ pcp.      I, Mikey Kirschner, PA-C have reviewed all documentation for this visit. The documentation on  09/10/2022  for the exam, diagnosis, procedures, and orders are all accurate and complete.  Mikey Kirschner, PA-C [facility] [address] [place], [place], [address] Office: [phone] Fax: [place]

## 2022-09-11 ENCOUNTER — Other Ambulatory Visit: Payer: Self-pay | Admitting: Family Medicine

## 2022-09-11 ENCOUNTER — Other Ambulatory Visit: Payer: Self-pay

## 2022-09-11 DIAGNOSIS — M5410 Radiculopathy, site unspecified: Secondary | ICD-10-CM

## 2022-09-11 DIAGNOSIS — F419 Anxiety disorder, unspecified: Secondary | ICD-10-CM

## 2022-09-11 MED ORDER — OXYCODONE-ACETAMINOPHEN 10-325 MG PO TABS: 1.0000 | ORAL_TABLET | ORAL | 0 refills | Status: DC | PRN

## 2022-09-11 MED ORDER — ALPRAZOLAM 1 MG PO TABS: 1.0000 mg | ORAL_TABLET | Freq: Three times a day (TID) | ORAL | 4 refills | Status: DC

## 2022-09-11 NOTE — Telephone Encounter (Signed)
Converted to refill request and sent to [facility] team

## 2022-09-11 NOTE — Telephone Encounter (Signed)
Medication Refill - Medication: ALPRAZolam (XANAX) 1 MG tablet  Has the patient contacted their pharmacy? Yes.    (Agent: If yes, when and what did the pharmacy advise?) call doctor  Preferred Pharmacy (with phone number or street name):  [place] [place] Phone:  [phone]  Fax:  [phone]     Has the patient been seen for an appointment in the last year OR does the patient have an upcoming appointment? Yes.    Agent: Please be advised that RX refills may take up to 3 business days. We ask that you follow-up with your pharmacy.

## 2022-09-11 NOTE — Telephone Encounter (Signed)
Requested medication (s) are due for refill today:yes  Requested medication (s) are on the active medication list:yes  Last refill:  06/18/22  Future visit scheduled:yes  Notes to clinic:  Unable to refill per protocol, cannot delegate.      Requested Prescriptions  Pending Prescriptions Disp Refills   ALPRAZolam (XANAX) 1 MG tablet 90 tablet 3    Sig: Take 1 tablet (1 mg total) by mouth 3 (three) times daily.     Not Delegated - Psychiatry: Anxiolytics/Hypnotics 2 Failed - 09/11/2022 10:43 AM      Failed - This refill cannot be delegated      Failed - Urine Drug Screen completed in last 360 days      Passed - Patient is not pregnant      Passed - Valid encounter within last 6 months    Recent Outpatient Visits           Yesterday Chronic respiratory failure with hypoxia [facility])   [facility] Mikey Kirschner, PA-C   1 month ago [place], Donald E, MD   3 months ago Toe pain, left   [facility] Tally Joe T, FNP   5 months ago Type 2 diabetes mellitus with diabetic nephropathy, without long-term current use of insulin [facility])   [facility] Birdie Sons, MD   8 months ago OSA (obstructive sleep apnea)   [place], DO       Future Appointments             In 1 month [name], [name], MD [facility] [place]

## 2022-09-11 NOTE — Telephone Encounter (Signed)
Pt checking status of refill request  Please advise

## 2022-09-17 ENCOUNTER — Ambulatory Visit: Payer: Self-pay

## 2022-10-05 ENCOUNTER — Other Ambulatory Visit: Payer: Self-pay | Admitting: Family Medicine

## 2022-10-06 IMAGING — CR DG CHEST 2V
2 series · 2 of 2 positions shown · non-contrast
Comparison: Radiograph and CT 10/06/2020

CLINICAL DATA: Chest pain/pressure.

EXAM:
CHEST - 2 VIEW

[chest pa]
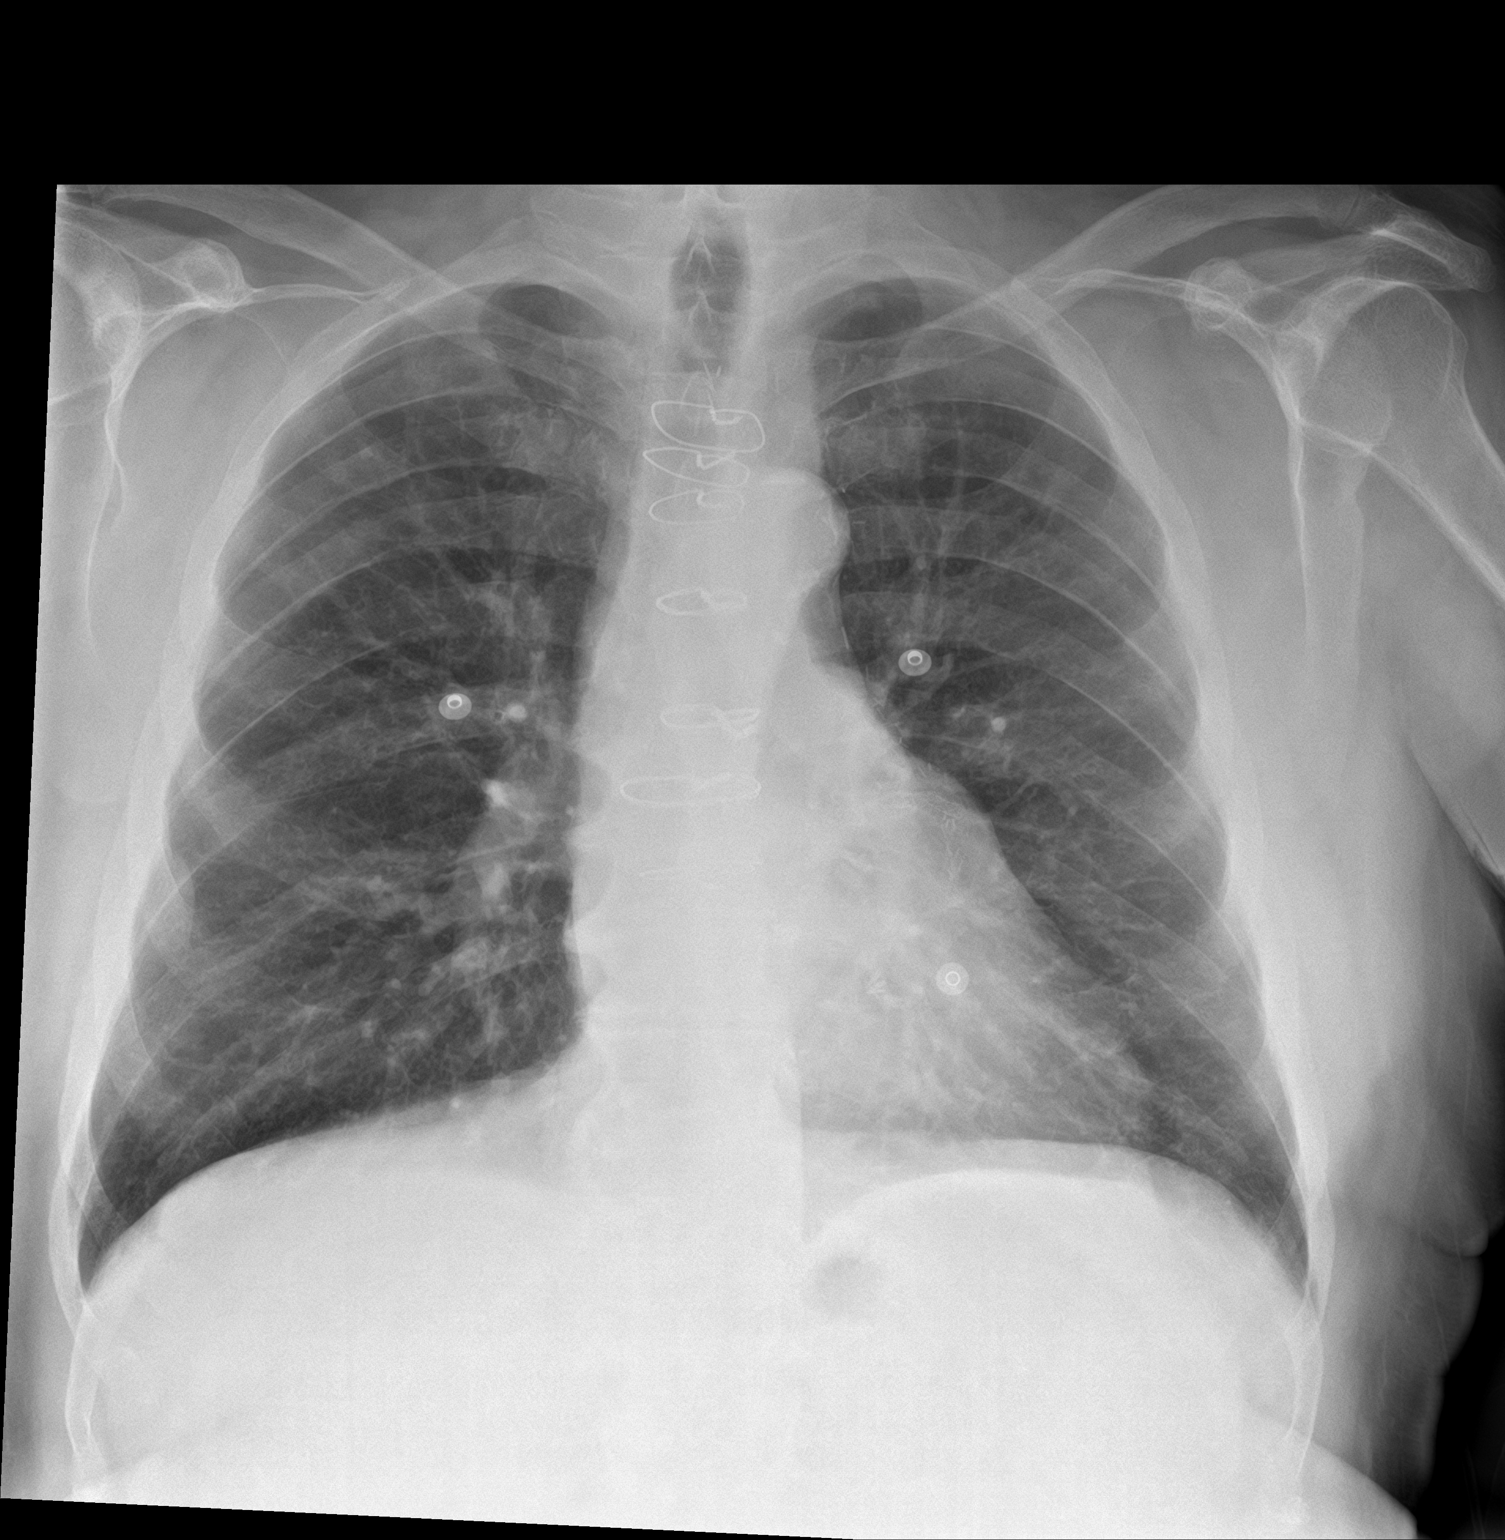

[chest lat]
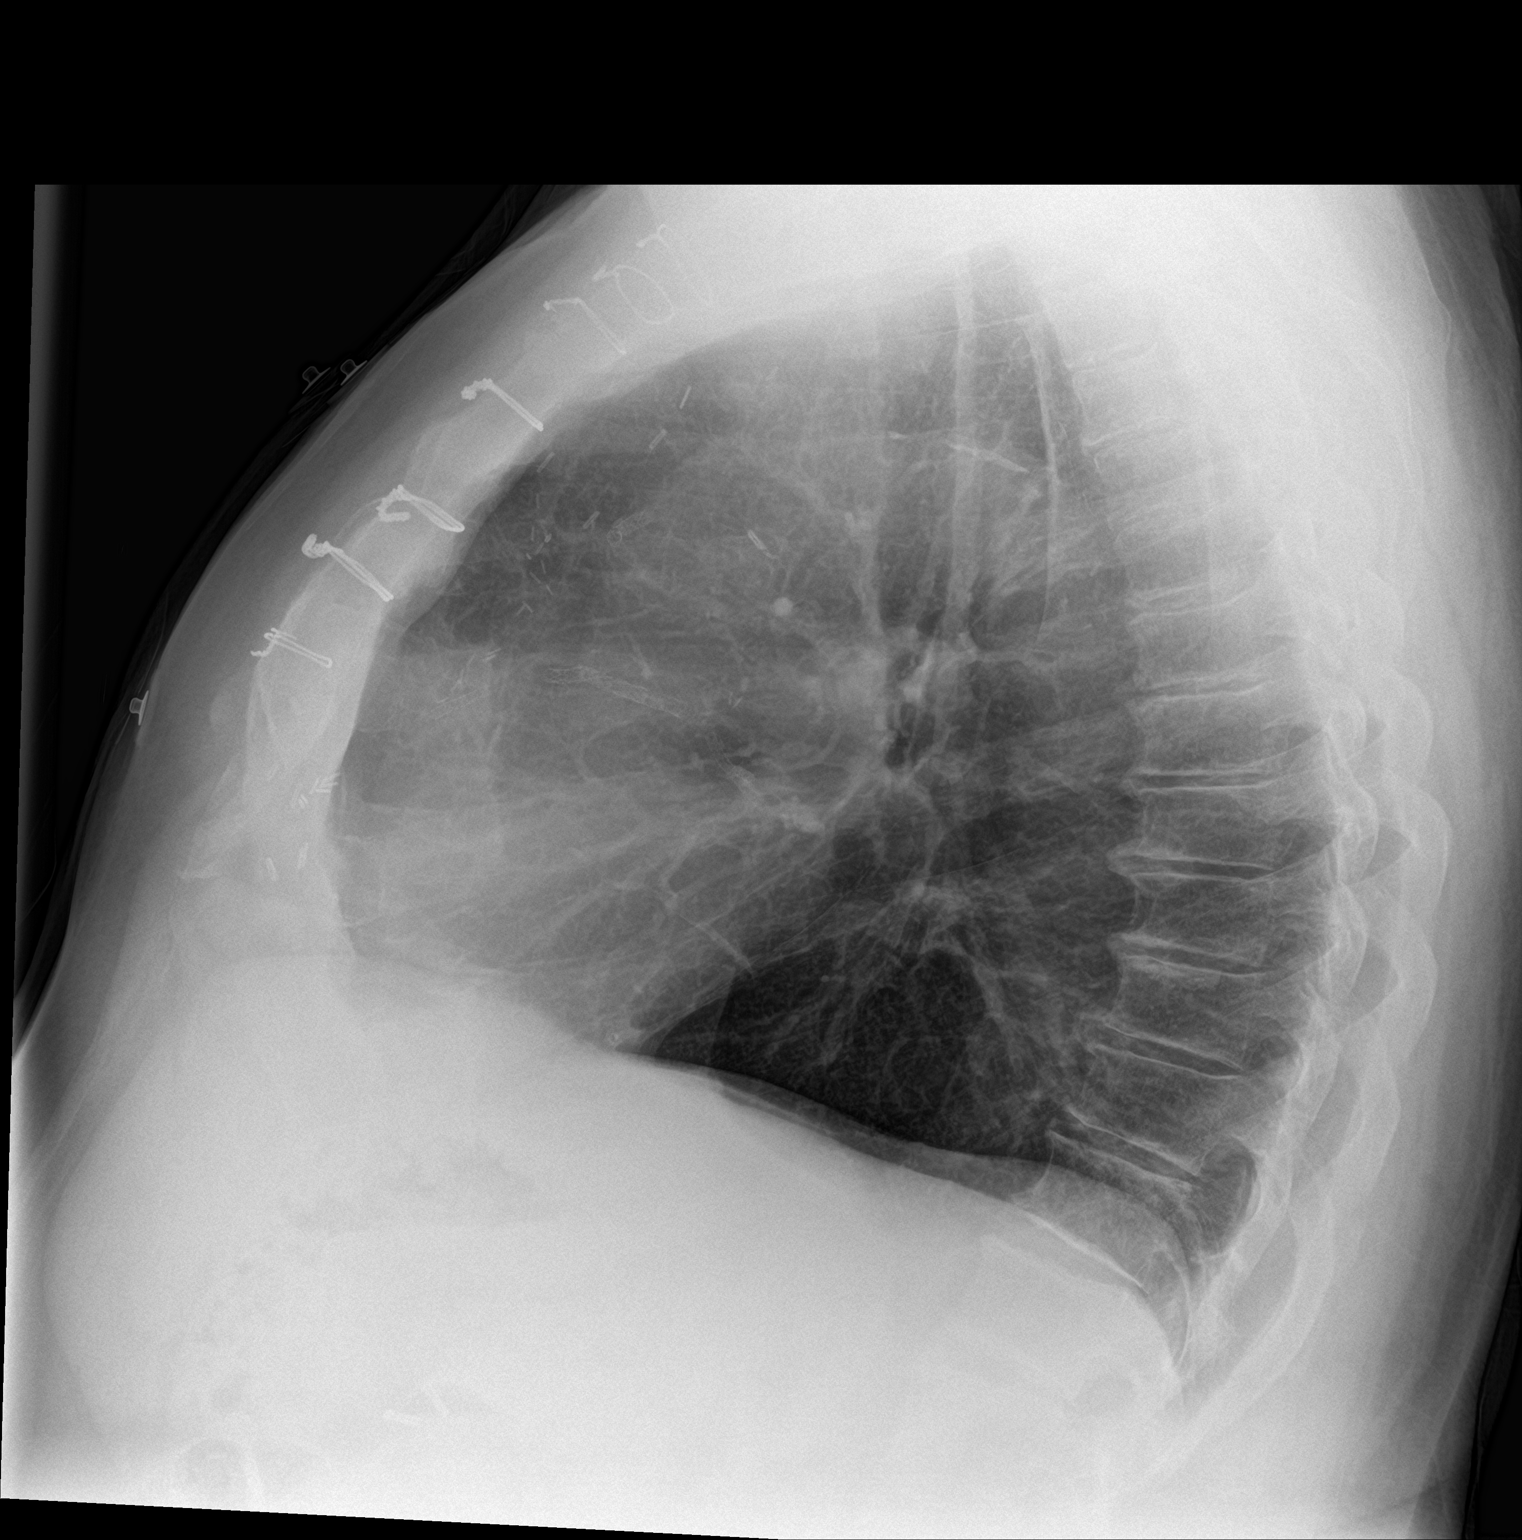

[2 of 2 positions shown; findings below may reference images not displayed]

FINDINGS: Post median sternotomy and CABG. The heart is normal in size.
Unchanged mediastinal contours. Chronic hyperinflation with
diaphragmatic flattening. No pulmonary edema, focal airspace
disease, pleural fluid or pneumothorax. Thoracic spondylosis. No
acute osseous abnormalities are seen.
IMPRESSION: 1. No acute chest findings.
2. Post CABG. Chronic hyperinflation.

## 2022-10-09 ENCOUNTER — Other Ambulatory Visit: Payer: Self-pay | Admitting: Family Medicine

## 2022-10-09 DIAGNOSIS — M5410 Radiculopathy, site unspecified: Secondary | ICD-10-CM

## 2022-10-19 ENCOUNTER — Ambulatory Visit: Payer: Medicare HMO | Admitting: Family Medicine

## 2022-10-19 ENCOUNTER — Other Ambulatory Visit: Payer: Self-pay | Admitting: Family Medicine

## 2022-10-19 DIAGNOSIS — I10 Essential (primary) hypertension: Secondary | ICD-10-CM

## 2022-10-19 NOTE — Progress Notes (Deleted)
Established patient visit   Patient: Lawrence [name]   DOB: 1956-02-28   66 y.o. Male  MRN: [id number] Visit Date: 10/19/2022  Today's healthcare provider: [name], MD   No chief complaint on file.  Subjective    HPI  Diabetes Mellitus Type II, follow-up  Lab Results  Component Value Date   HGBA1C 7.2 (A) 04/03/2022   HGBA1C 8.4 (H) 12/23/2021   HGBA1C 7.4 (H) 04/27/2021   Last seen for diabetes 6 months ago.  Management since then includes continuing the same treatment.  Home blood sugar records: {diabetes glucometry results:16657} Most Recent Eye Exam: ***  --------------------------------------------------------------------------------------------------- Hypertension, follow-up  BP Readings from Last 3 Encounters:  09/10/22 (!) 149/99  08/07/22 (!) 136/90  07/29/22 138/82   Wt Readings from Last 3 Encounters:  09/10/22 207 lb 9.6 oz (94.2 kg)  08/07/22 181 lb (82.1 kg)  07/15/22 181 lb (82.1 kg)     He was last seen for hypertension 6 months ago.  Management since that visit includes; no changes were made.    Outside blood pressures are {enter patient reported home BP, or 'not being checked':1}.  --------------------------------------------------------------------------------------------------- Lipid/Cholesterol, follow-up  Last Lipid Panel: Lab Results  Component Value Date   CHOL 117 12/23/2021   LDLCALC 63 12/23/2021   LDLDIRECT 122.1 (H) 09/27/2020   HDL 29 (L) 12/23/2021   TRIG 144 12/23/2021    He was last seen for this 9 months ago.  Management since that visit includes; on atorvastatin 80 mg.  Last metabolic panel Lab Results  Component Value Date   GLUCOSE 154 (H) 08/07/2022   NA 143 08/07/2022   K 4.3 08/07/2022   BUN 12 08/07/2022   CREATININE 0.73 08/07/2022   EGFR 94 12/23/2021   GFRNONAA >60 08/07/2022   CALCIUM 9.1 08/07/2022   AST 14 (L) 08/07/2022   ALT 12 08/07/2022   The ASCVD Risk score (Arnett DK, et al.,  2019) failed to calculate for the following reasons:   The patient has a prior MI or stroke diagnosis  ---------------------------------------------------------------------------------------------------   Medications: Outpatient Medications Prior to Visit  Medication Sig   Accu-Chek Softclix Lancets lancets Use as instructed to check sugar daily for type 2 diabetes.   acetaminophen (TYLENOL) 325 MG tablet Take 2 tablets (650 mg total) by mouth every 4 (four) hours as needed for headache or mild pain.   albuterol (VENTOLIN HFA) 108 (90 Base) MCG/ACT inhaler INHALE 2 PUFFS BY MOUTH EVERY 6 HOURS AS NEEDED FOR SHORTNESS OF BREATH   allopurinol (ZYLOPRIM) 300 MG tablet TAKE 1 TABLET BY MOUTH TWICE A DAY   ALPRAZolam (XANAX) 1 MG tablet Take 1 tablet (1 mg total) by mouth 3 (three) times daily.   amiodarone (PACERONE) 200 MG tablet Take 200 mg by mouth 2 (two) times daily.   aspirin 81 MG chewable tablet Chew 81 mg by mouth daily. Swallows whole   atorvastatin (LIPITOR) 80 MG tablet TAKE 1 TABLET BY MOUTH AT BEDTIME   BELSOMRA 5 MG TABS TAKE 1 TABLET (5 MG TOTAL) BY MOUTH AT BEDTIME AS NEEDED   Blood Glucose Calibration (ACCU-CHEK GUIDE CONTROL) LIQD Use with blood glucose monitor as directed   Blood Glucose Monitoring Suppl (ACCU-CHEK GUIDE) w/Device KIT Use to check blood sugars as directed   BREO ELLIPTA 100-25 MCG/ACT AEPB INHALE 1 PUFF BY MOUTH INTO THE LUNGS DAILY.   celecoxib (CELEBREX) 200 MG capsule Take 1 capsule by mouth 2 (two) times daily.   cetirizine (ZYRTEC)  10 MG tablet TAKE 1 TABLET BY MOUTH AT BEDTIME   diltiazem (CARDIZEM CD) 180 MG 24 hr capsule Take 1 capsule (180 mg total) by mouth daily.   docusate sodium (COLACE) 100 MG capsule Take 100 mg by mouth daily as needed for mild constipation. (Patient not taking: Reported on 09/10/2022)   ELIQUIS 5 MG TABS tablet TAKE 1 TABLET BY MOUTH TWICE A DAY   FARXIGA 10 MG TABS tablet TAKE 1 TABLET BY MOUTH DAILY   glucose blood  (ACCU-CHEK GUIDE) test strip Use as instructed to check sugar daily for type 2 diabetes.   isosorbide mononitrate (IMDUR) 60 MG 24 hr tablet Take 3 tablets (180 mg total) by mouth daily.   linaclotide (LINZESS) 72 MCG capsule Take 72 mcg by mouth daily before breakfast.   lisinopril (ZESTRIL) 10 MG tablet TAKE 1 TABLET BY MOUTH DAILY   meclizine (ANTIVERT) 25 MG tablet TAKE ONE TABLET BY THREE TIMES A DAY AS NEEDED FOR DIZZINESS   metFORMIN (GLUCOPHAGE-XR) 500 MG 24 hr tablet TAKE 1 TABLET BY MOUTH TWICE A DAY   methocarbamol (ROBAXIN) 500 MG tablet Take 500 mg by mouth every 8 (eight) hours as needed for muscle spasms. Take 1 tablet by mouth every hour as needed for muscle spasms   metoprolol tartrate (LOPRESSOR) 25 MG tablet Take 1 tablet (25 mg total) by mouth 2 (two) times daily.   naloxone (NARCAN) nasal spray 4 mg/0.1 mL 1 spray into nostril x1 and may repeat every 2-3 minutes until patient is responsive or EMS arrives   nitroGLYCERIN (NITROSTAT) 0.4 MG SL tablet Place 1 tablet (0.4 mg total) under the tongue every 5 (five) minutes as needed for chest pain.   omega-3 acid ethyl esters (LOVAZA) 1 g capsule Take 4 capsules (4 g total) by mouth daily.   omeprazole (PRILOSEC) 40 MG capsule TAKE 1 CAPSULE BY MOUTH 2 TIMES DAILY 30 MINUTES BEFORE BREAKFAST AND DINNER   ondansetron (ZOFRAN-ODT) 4 MG disintegrating tablet Take 1 tablet (4 mg total) by mouth every 8 (eight) hours as needed for nausea or vomiting. (Patient not taking: Reported on 09/10/2022)   oxyCODONE-acetaminophen (PERCOCET) 10-325 MG tablet TAKE 1 TABLET BY MOUTH EVERY 4 HOURS AS NEEDED FOR PAIN.   ranolazine (RANEXA) 1000 MG SR tablet Take 1 tablet (1,000 mg total) by mouth 2 (two) times daily.   SPIRIVA HANDIHALER 18 MCG inhalation capsule PLACE 1 CAPSULE INTO INHALER AND INHALE BY MOUTH ONCE A DAY.   torsemide (DEMADEX) 100 MG tablet Take 0.5 tablets (50 mg total) by mouth daily.   traZODone (DESYREL) 150 MG tablet TAKE 1 TABLET  BY MOUTH AT BEDTIME   venlafaxine XR (EFFEXOR-XR) 75 MG 24 hr capsule TAKE 1 CAPSULE BY MOUTH DAILY WITH BREAKFAST   No facility-administered medications prior to visit.    Review of Systems  Constitutional:  Negative for appetite change, chills and fever.  Respiratory:  Negative for chest tightness, shortness of breath and wheezing.   Cardiovascular:  Negative for chest pain and palpitations.  Gastrointestinal:  Negative for abdominal pain, nausea and vomiting.    {Labs  Heme  Chem  Endocrine  Serology  Results Review (optional):23779}   Objective    There were no vitals taken for this visit. {Show previous vital signs (optional):23777}  Physical Exam  ***  No results found for any visits on 10/19/22.  Assessment & Plan     ***  No follow-ups on file.      {provider attestation***:1}   Elenore Rota  [name], MD  [facility] [phone] (phone) [phone] (fax)  [place]

## 2022-10-19 NOTE — Telephone Encounter (Signed)
Requested medications are due for refill today.  yes  Requested medications are on the active medications list.  yes  Last refill. 08/08/2022 #90 0 rf  Future visit scheduled.   no  Notes to clinic.  Rx written by Berle Mull    Requested Prescriptions  Pending Prescriptions Disp Refills   isosorbide mononitrate (IMDUR) 60 MG 24 hr tablet [Pharmacy Med Name: ISOSORBIDE MONONITRATE ER 60 MG TAB] 90 tablet 0    Sig: TAKE 3 TABLETS BY MOUTH EVERY DAY.     Cardiovascular:  Nitrates Failed - 10/19/2022  9:16 AM      Failed - Last BP in normal range    BP Readings from Last 1 Encounters:  09/10/22 (!) 149/99         Passed - Last Heart Rate in normal range    Pulse Readings from Last 1 Encounters:  09/10/22 72         Passed - Valid encounter within last 12 months    Recent Outpatient Visits           1 month ago Chronic respiratory failure with hypoxia [facility])   [facility] Mikey Kirschner, PA-C   3 months ago [place], Donald E, MD   4 months ago Toe pain, left   [facility] Tally Joe T, FNP   6 months ago Type 2 diabetes mellitus with diabetic nephropathy, without long-term current use of insulin [facility])   [facility] Birdie Sons, MD   10 months ago OSA (obstructive sleep apnea)   [facility] Myles Gip, DO

## 2022-10-22 IMAGING — CR DG CHEST 2V
3 series · 3 of 3 positions shown · non-contrast
Comparison: Chest radiograph dated 11/24/2020.

CLINICAL DATA: 64-year-old male with chest pain.

EXAM:
CHEST - 2 VIEW

[chest lat (1 of 2)]
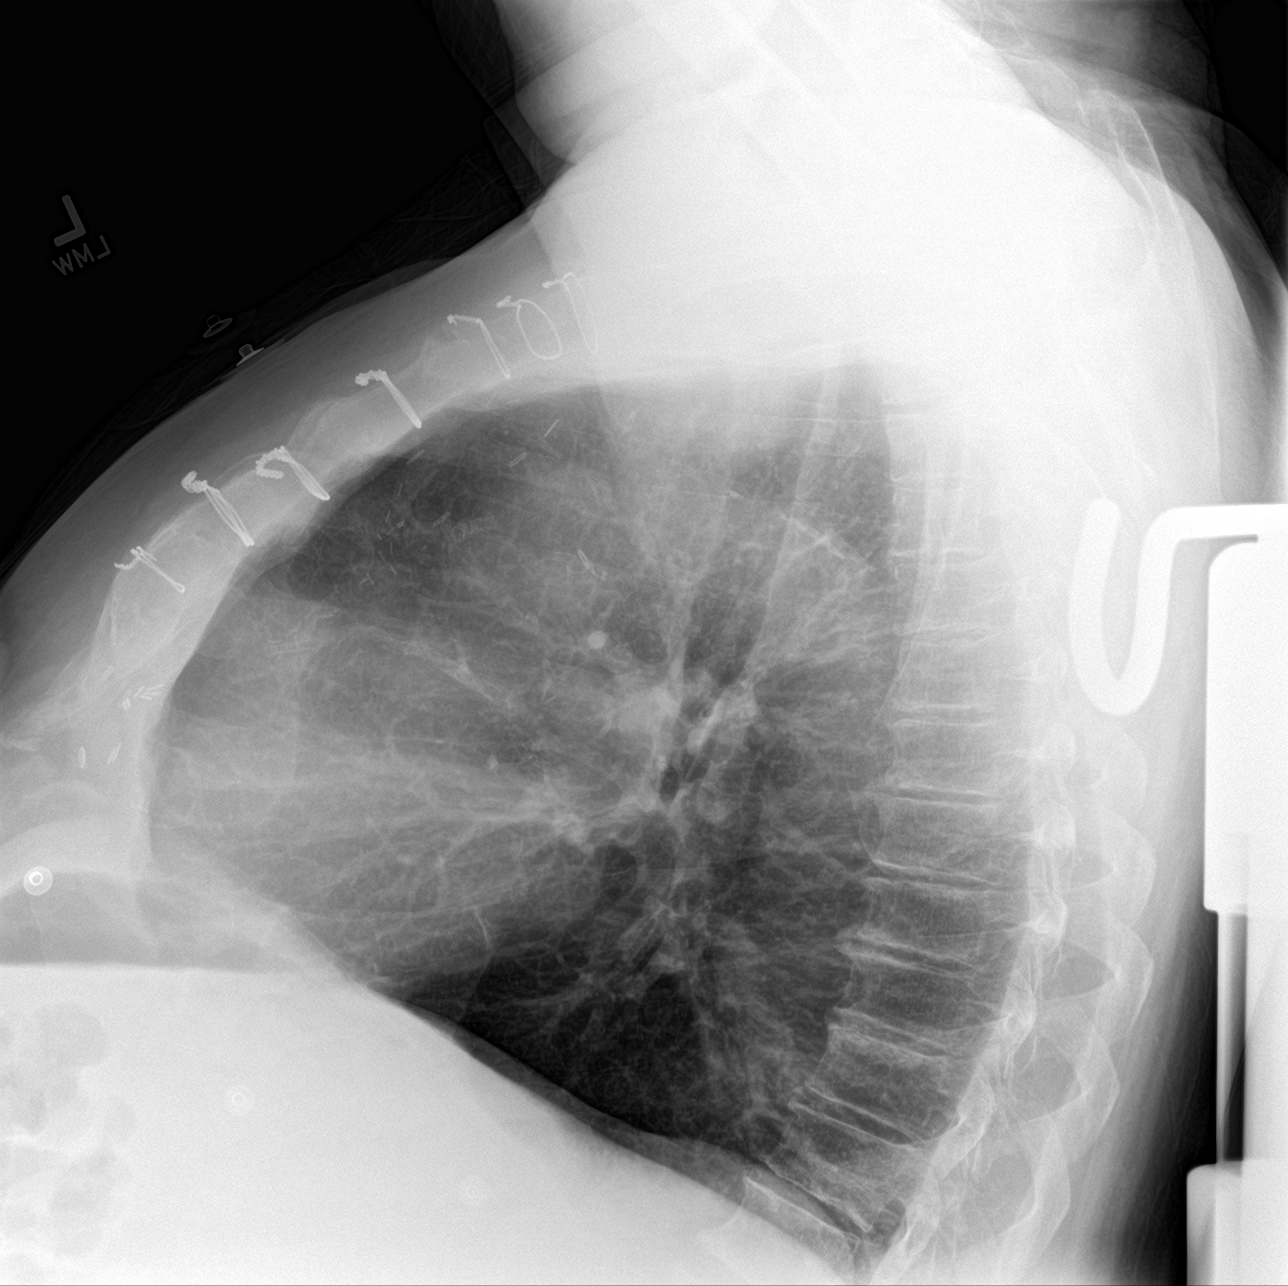

[chest ap]
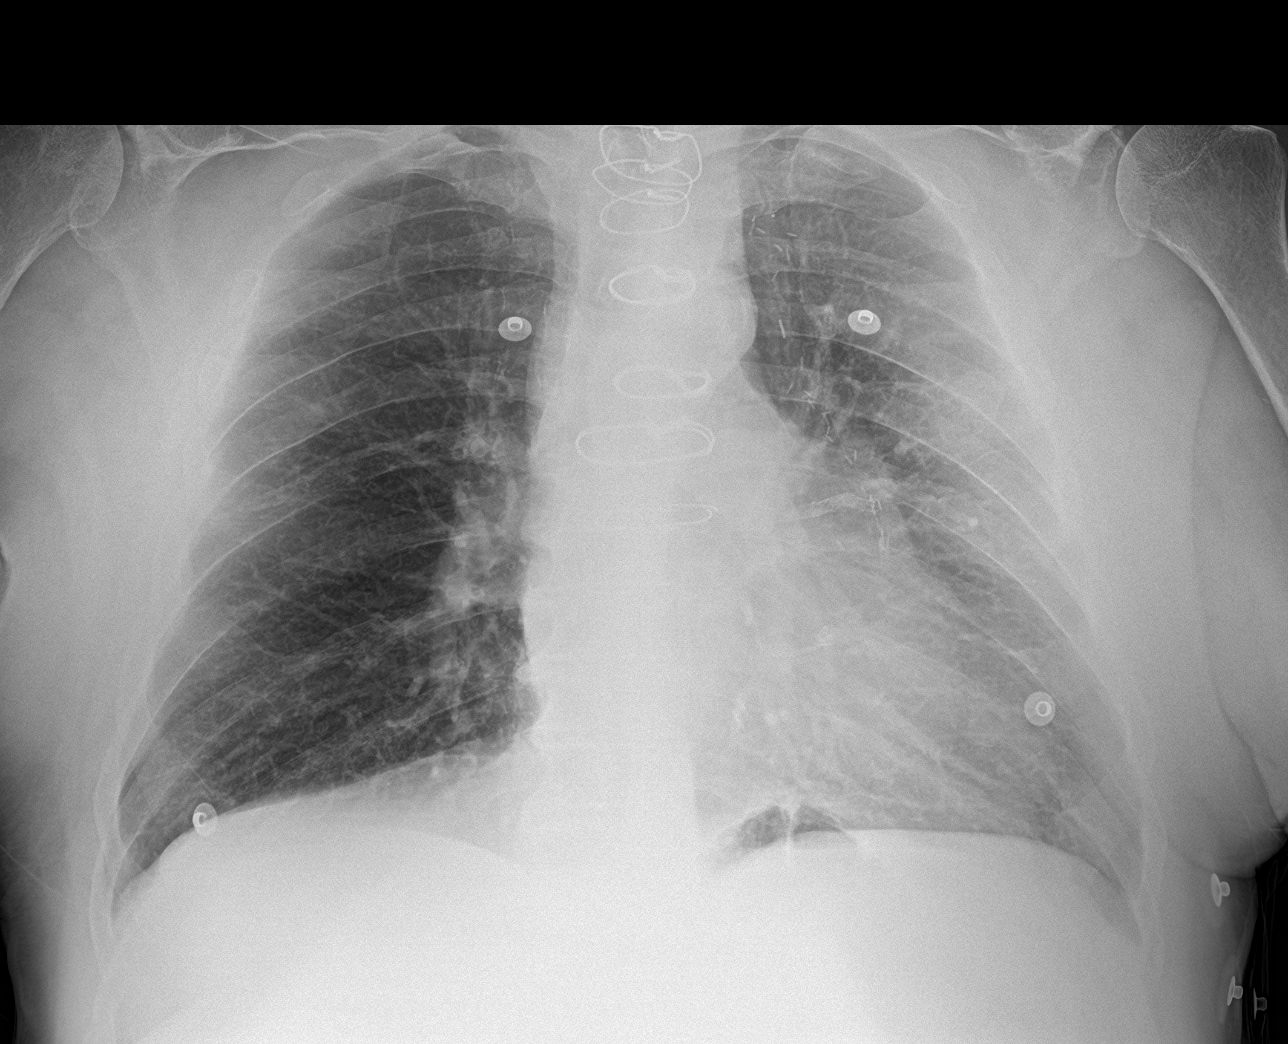

[chest lat (2 of 2)]
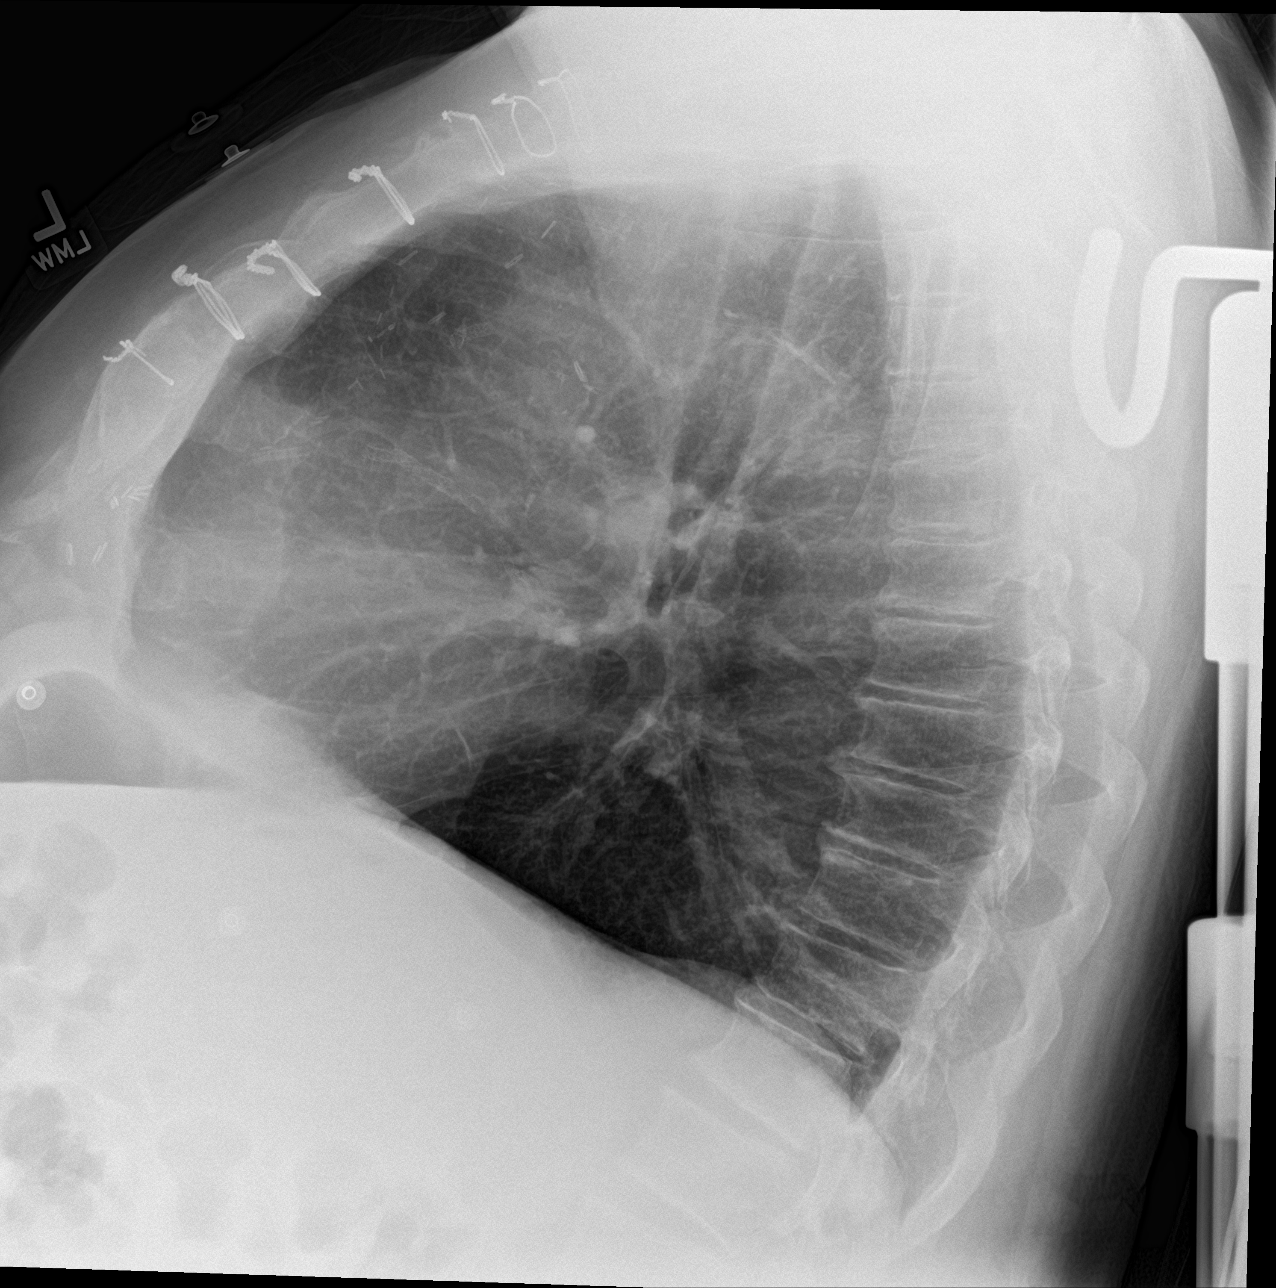

[3 of 3 positions shown; findings below may reference images not displayed]

FINDINGS: No focal consolidation, pleural effusion or pneumothorax. Mild
cardiomegaly. Median sternotomy wires, CABG vascular clips, and
coronary stent. No acute osseous pathology.
IMPRESSION: No active cardiopulmonary disease.

## 2022-10-23 IMAGING — MR MR HEAD W/O CM
11 series · 44 of 48 positions shown · non-contrast
Comparison: 12/11/2020 and prior.

CLINICAL DATA: Syncope, simple, normal neuro exam

EXAM:
MRI HEAD WITHOUT CONTRAST
TECHNIQUE: Multiplanar, multiecho pulse sequences of the brain and surrounding
structures were obtained without intravenous contrast.

[Series 5: ax dwi_tracew · axial · 3.0mm · 0.60mm/px · z∈[-114,+39]mm · 4 of 48 slices shown]
[im 1/48]
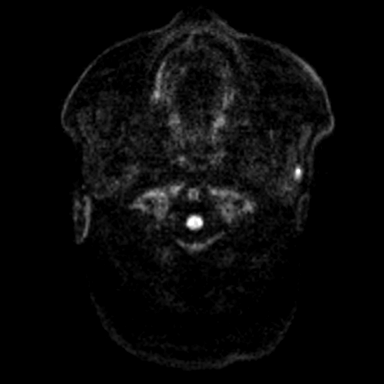
[im 16/48]
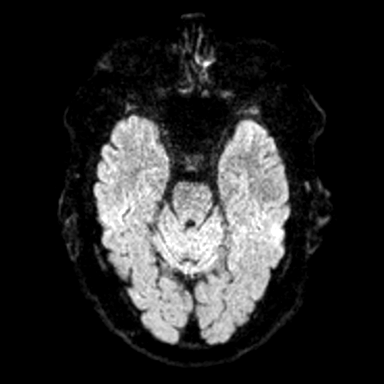
[im 32/48]
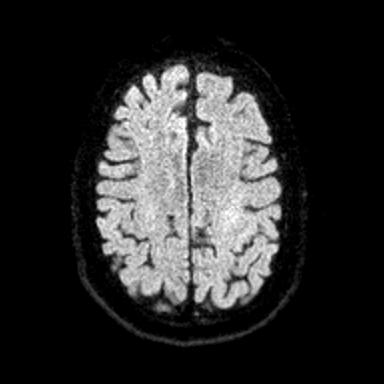
[im 48/48]
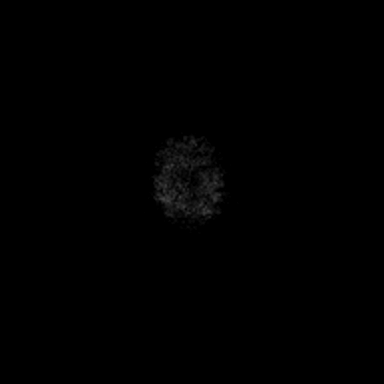

[Series 6: ax dwi_adc · axial · 3.0mm · 0.60mm/px · z∈[-114,+39]mm · 4 of 48 slices shown]
[im 1/48]
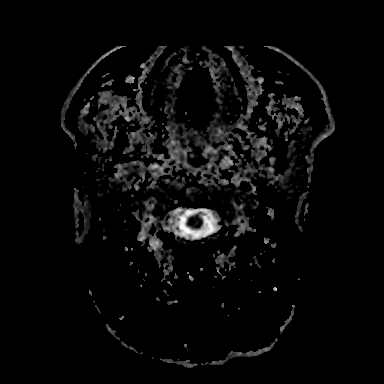
[im 16/48]
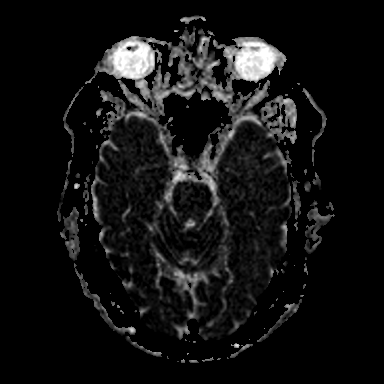
[im 32/48]
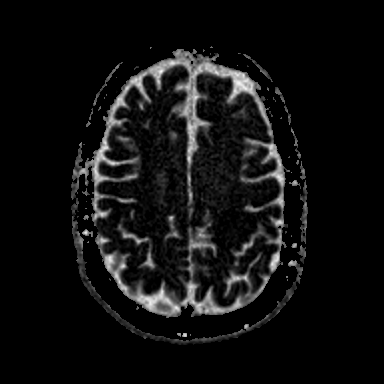
[im 48/48]
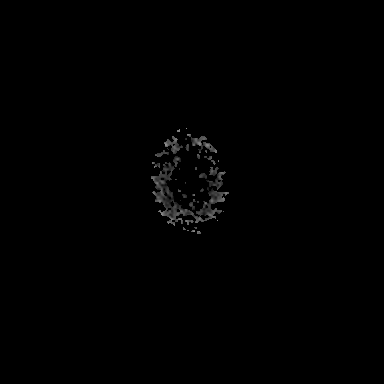

[Series 7: cor dwi_tracew · coronal · 5.0mm · 0.60mm/px · 3 of 40 slices shown]
[im 1/40]
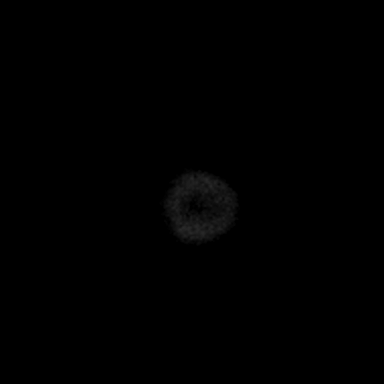
[im 20/40]
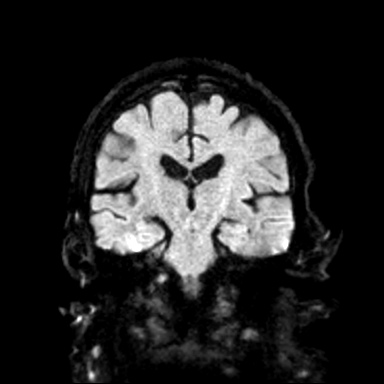
[im 40/40]
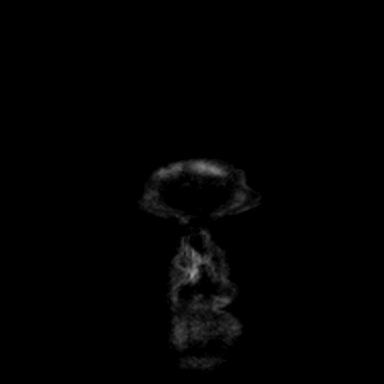

[Series 8: cor dwi_adc · coronal · 5.0mm · 0.60mm/px · 3 of 40 slices shown]
[im 1/40]
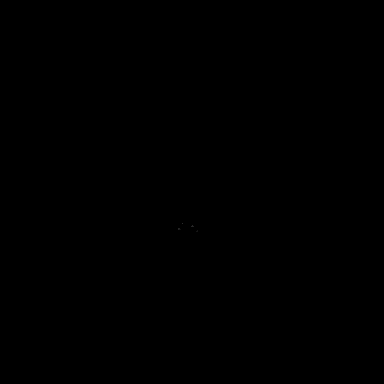
[im 20/40]
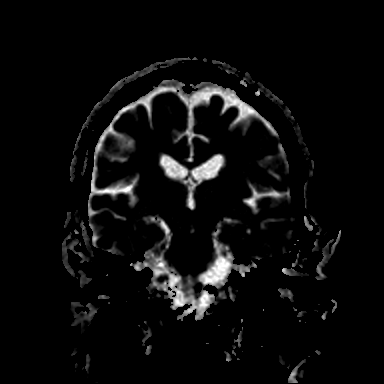
[im 40/40]
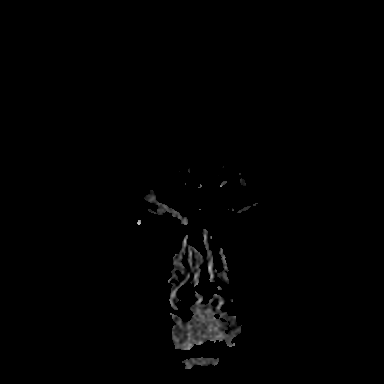

[Series 9: T1 · sagittal · 5.0mm · 0.62mm/px · 2 of 24 slices shown (1 of 2)]
[im 1/24]
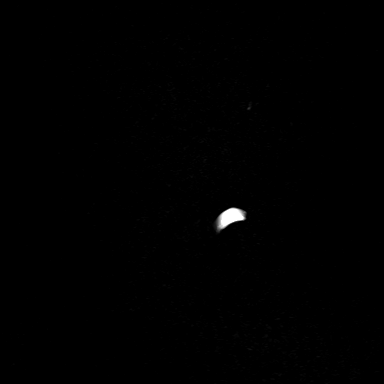
[im 24/24]
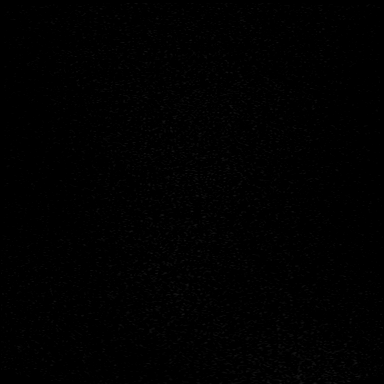

[Series 10: T2 · axial · 5.0mm · 0.53mm/px · z∈[-112,+30]mm · 2 of 25 slices shown (1 of 2)]
[im 1/25]
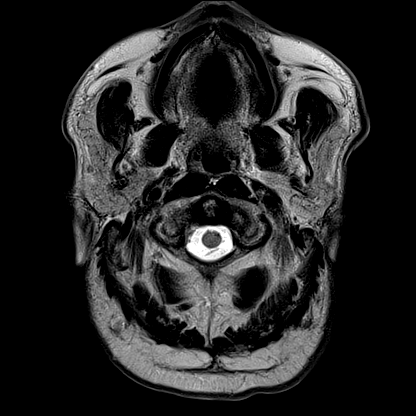
[im 25/25]
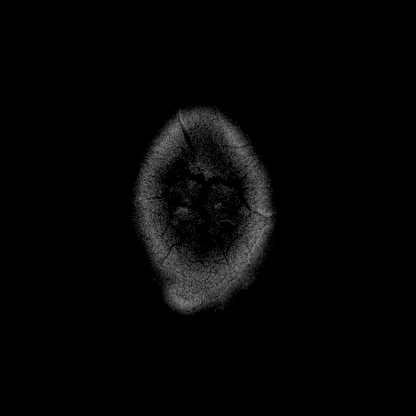

[Series 12: pha_images · axial · 3.0mm · 0.90mm/px · z∈[-125,+44]mm · 5 of 58 slices shown]
[im 1/58]
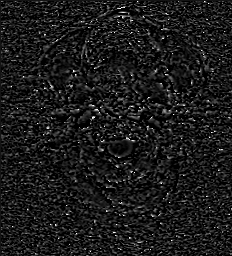
[im 15/58]
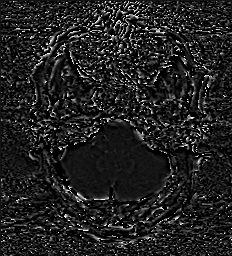
[im 29/58]
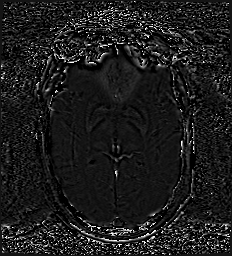
[im 43/58]
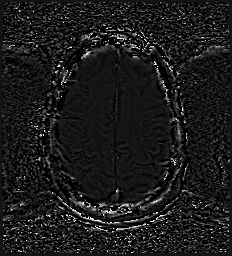
[im 58/58]
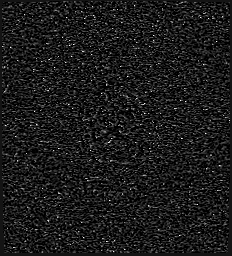

[Series 13: swi_images · axial · 3.0mm · 0.90mm/px · z∈[-128,+47]mm · 5 of 60 slices shown]
[im 1/60]
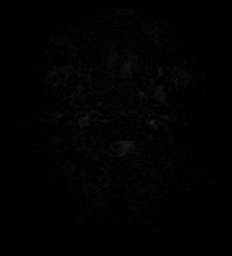
[im 15/60]
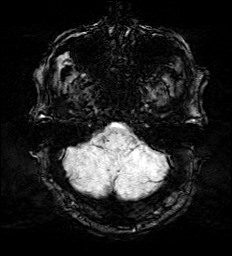
[im 30/60]
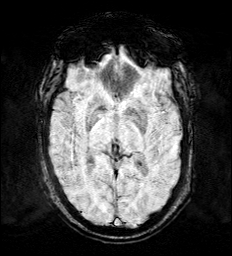
[im 45/60]
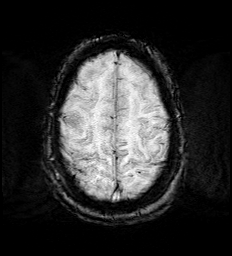
[im 60/60]
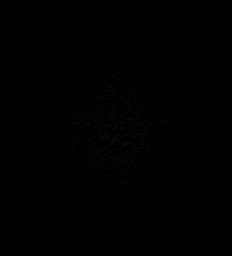

[Series 15: FLAIR · axial · 3.0mm · 0.53mm/px · z∈[-121,+39]mm · 4 of 55 slices shown]
[im 1/55]
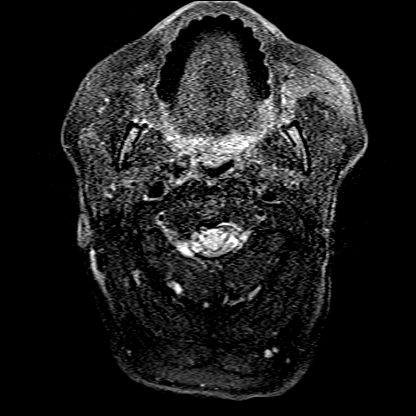
[im 19/55]
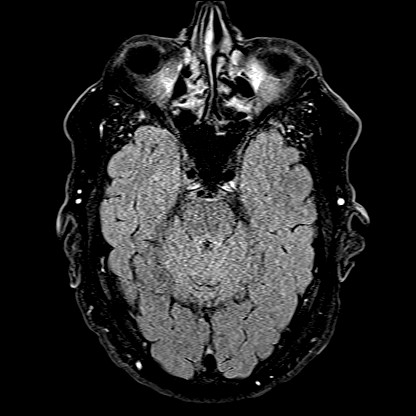
[im 37/55]
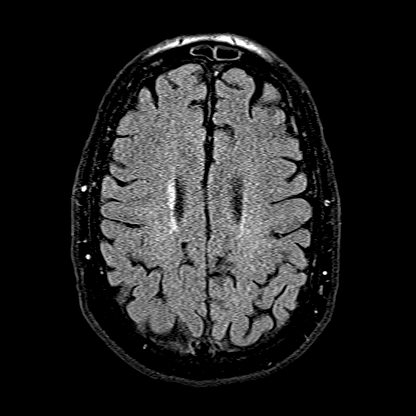
[im 55/55]
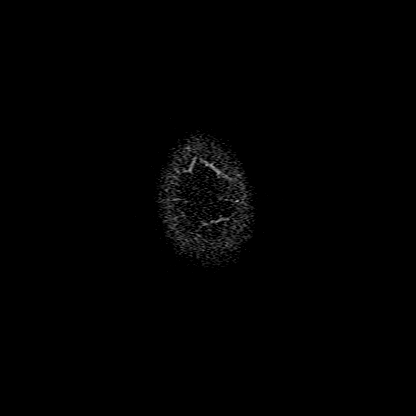

[Series 16: T1 · axial · 1.0mm · 0.98mm/px · z∈[-122,+51]mm · 10 of 176 slices shown (2 of 2)]
[im 1/176]
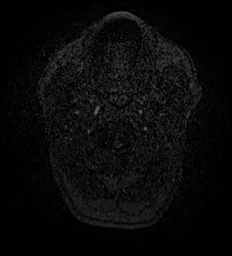
[im 14/176]
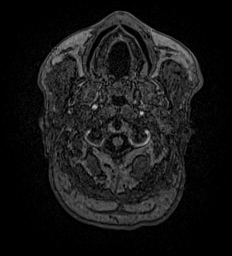
[im 27/176]
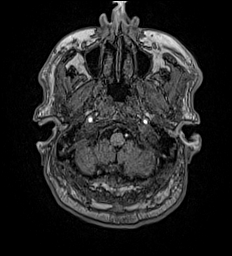
[im 41/176]
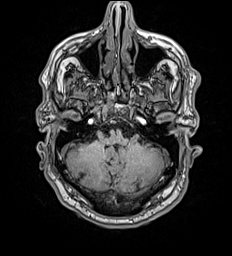
[im 54/176]
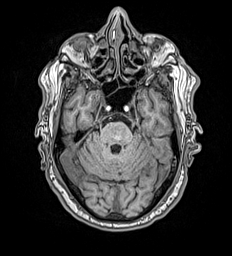
[im 81/176]
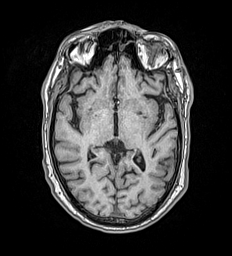
[im 95/176]
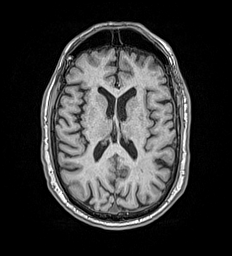
[im 122/176]
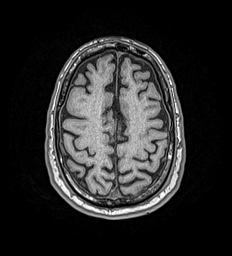
[im 149/176]
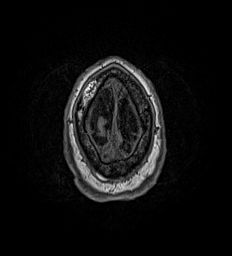
[im 176/176]
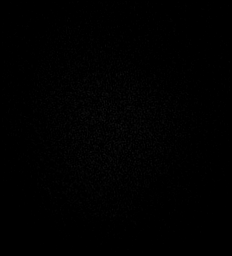

[Series 17: T2 · coronal · 5.0mm · 0.57mm/px · 2 of 29 slices shown (2 of 2)]
[im 1/29]
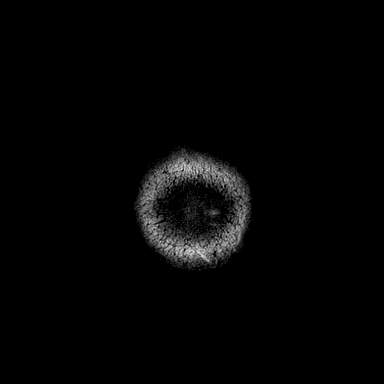
[im 29/29]
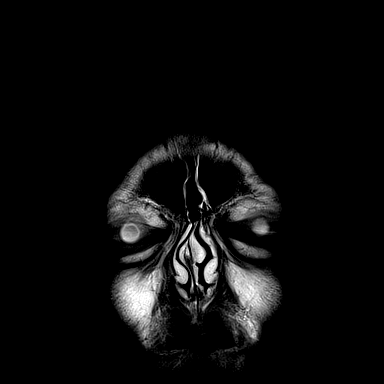

[44 of 48 positions shown; findings below may reference images not displayed]

FINDINGS: Brain: No diffusion-weighted signal abnormality. Remote left
occipital microhemorrhage. No midline shift, ventriculomegaly or
extra-axial fluid collection. No mass lesion. Minimal chronic
microvascular ischemic changes.

Vascular: Normal flow voids.

Skull and upper cervical spine: Normal marrow signal.

Sinuses/Orbits: Normal orbits. Mild pansinus mucosal thickening.
Small nasopharyngeal mucous retention cyst. No mastoid effusion.

Other: Small right parietal scalp lipoma.
IMPRESSION: No acute intracranial process. Remote left occipital
microhemorrhage.

Minimal chronic microvascular ischemic changes.

## 2022-10-23 IMAGING — CT CT HEAD W/O CM
4 series · 16 of 47 positions shown, 18 images · non-contrast
Comparison: None.

CLINICAL DATA: Syncope, head injury, chronic anticoagulation

EXAM:
CT HEAD WITHOUT CONTRAST
TECHNIQUE: Contiguous axial images were obtained from the base of the skull
through the vertex without intravenous contrast.

[Series 2: head wo · axial · 0.48mm/px · z∈[-136,-16]mm · 7 of 33 slices shown, 9 images]
[im 5/33  brain]
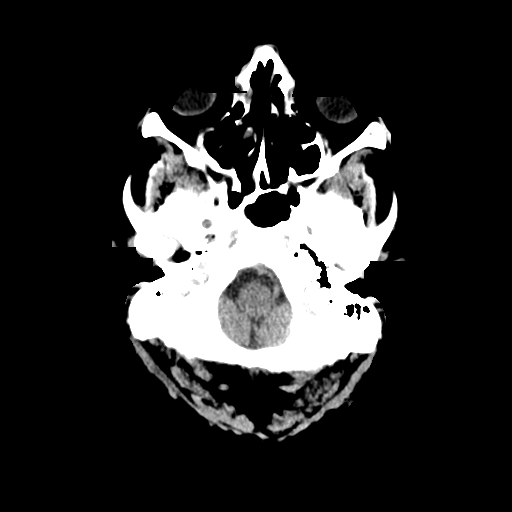
[im 5/33  bone]
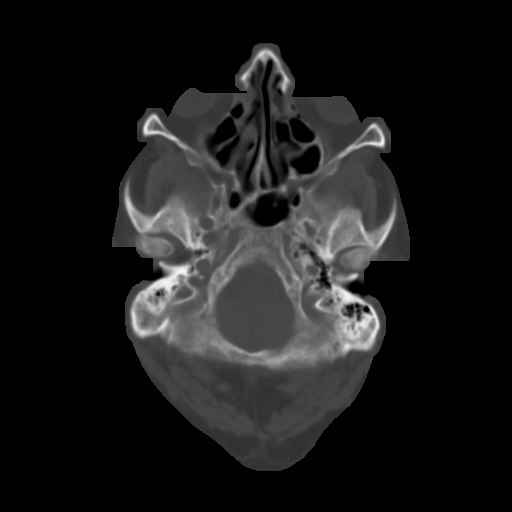
[im 9/33  brain]
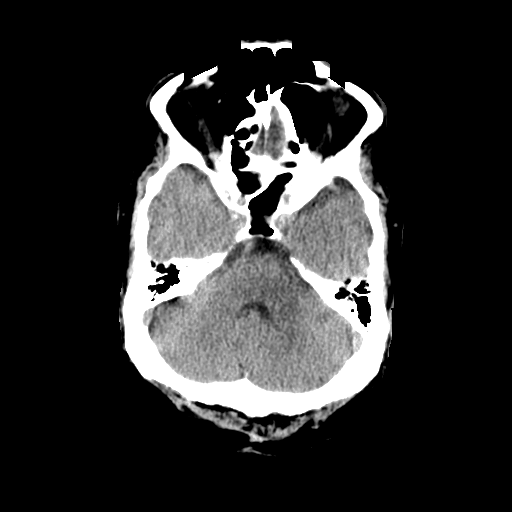
[im 13/33  brain]
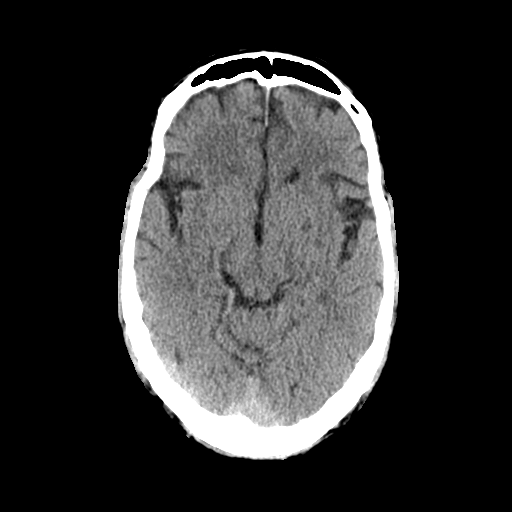
[im 17/33  brain]
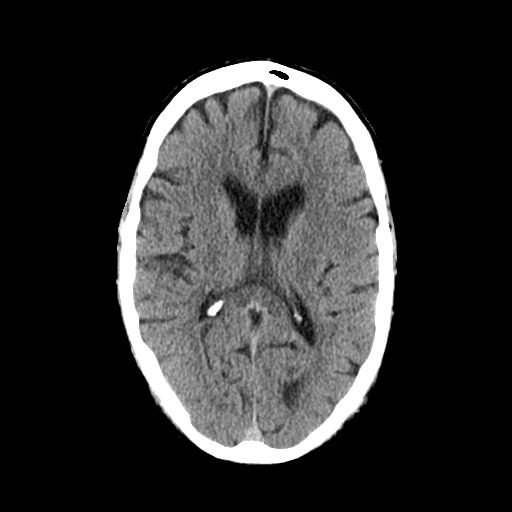
[im 21/33  brain]
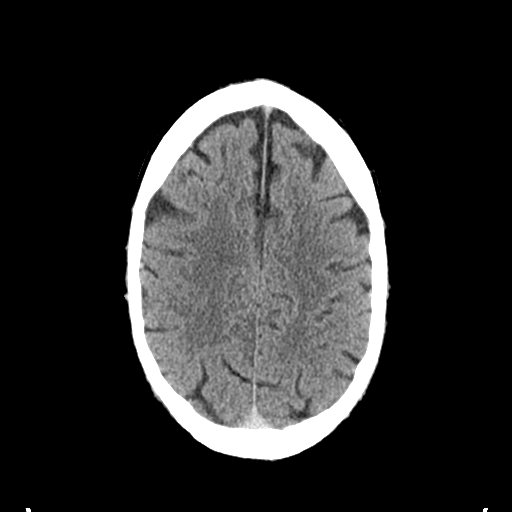
[im 21/33  bone]
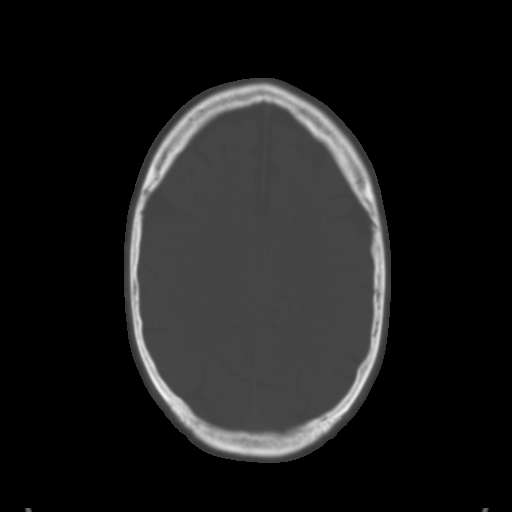
[im 25/33  brain]
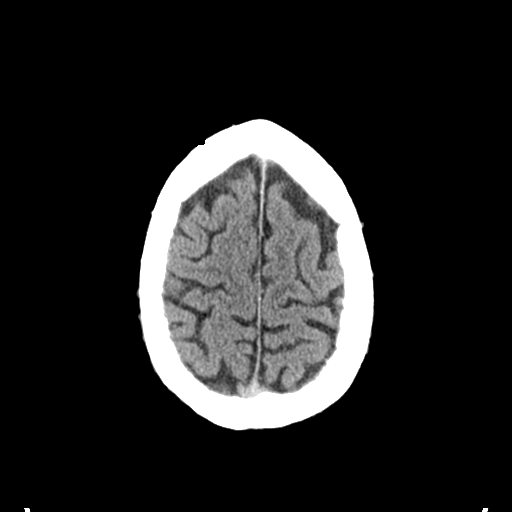
[im 29/33  brain]
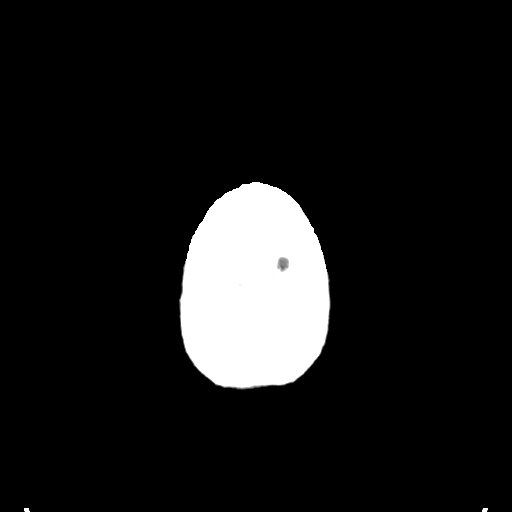

[Series 3: head bone · axial · 0.48mm/px · z∈[-140,-108]mm · 3 of 82 slices shown]
[im 9/82  bone]
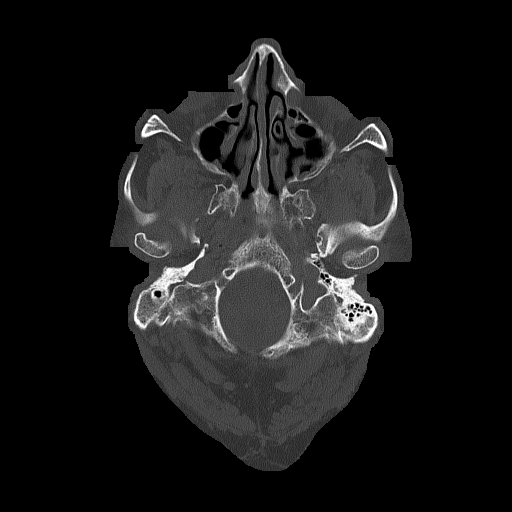
[im 17/82  bone]
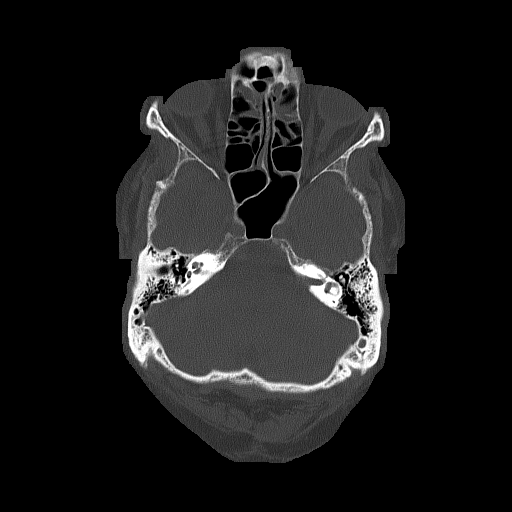
[im 25/82  bone]
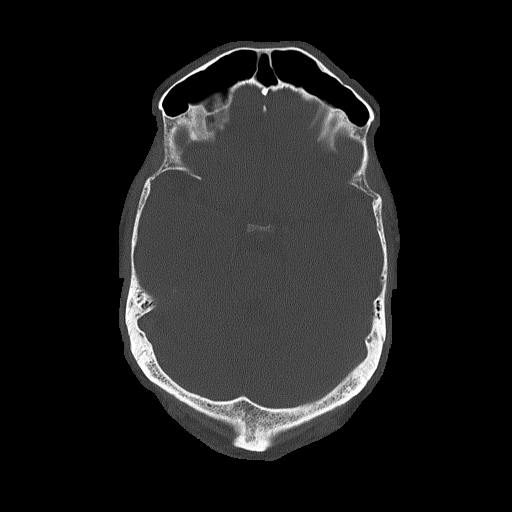

[Series 4: coronal soft tissue · coronal · 0.31mm/px · 3 of 77 slices shown]
[im 26/77  brain]
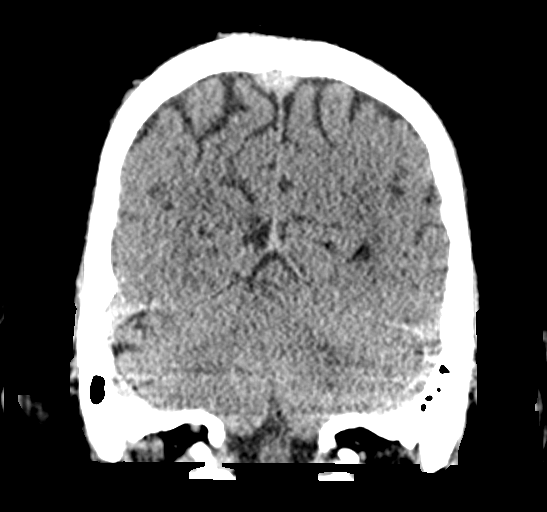
[im 34/77  brain]
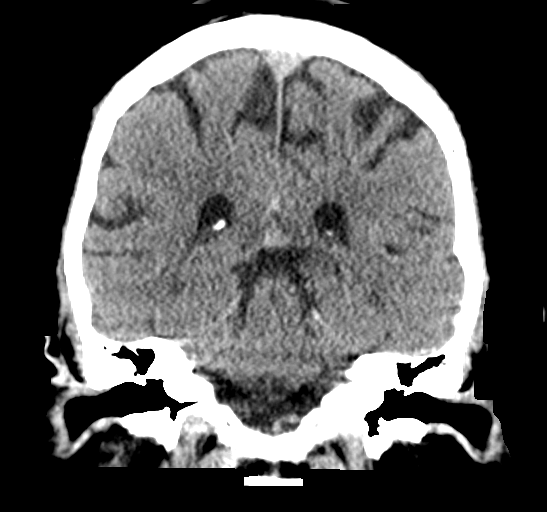
[im 43/77  brain]
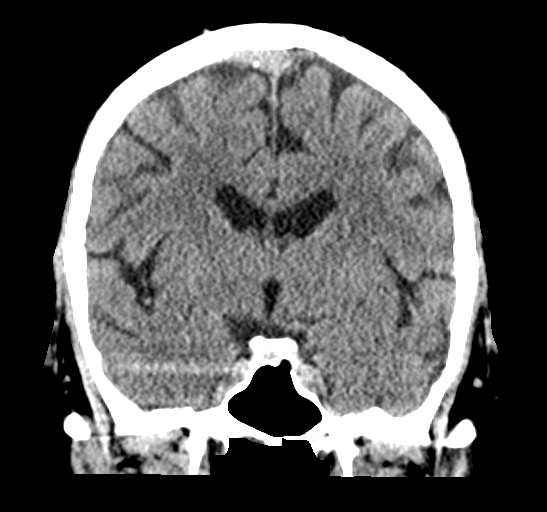

[Series 5: sagittal soft tissue · sagittal · 0.33mm/px · 3 of 58 slices shown]
[im 20/58  brain]
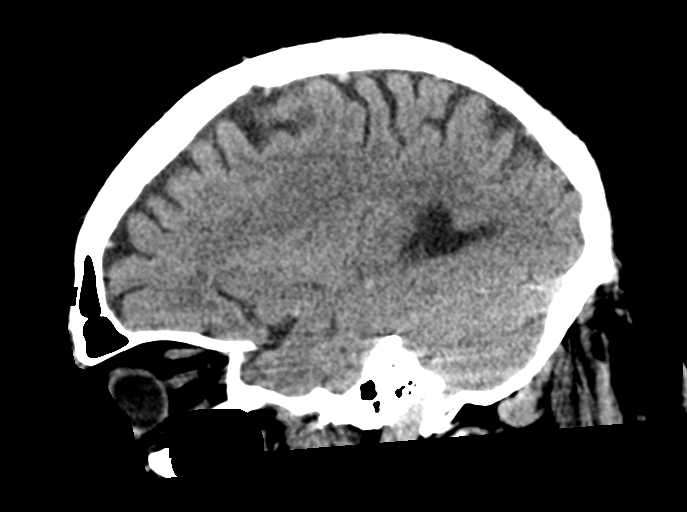
[im 29/58  brain]
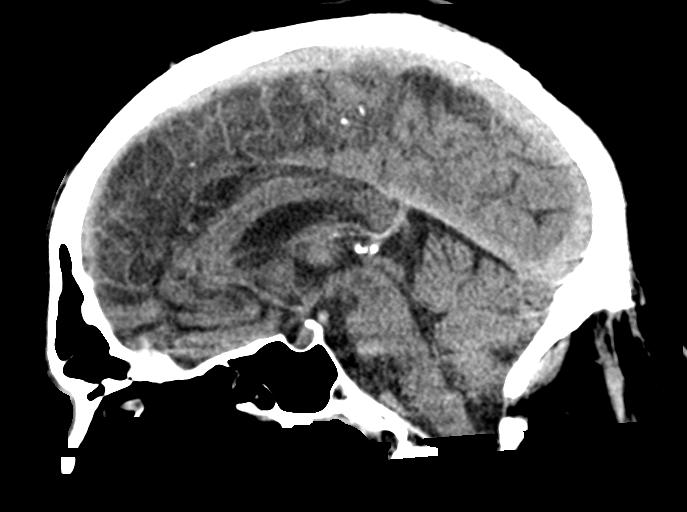
[im 39/58  brain]
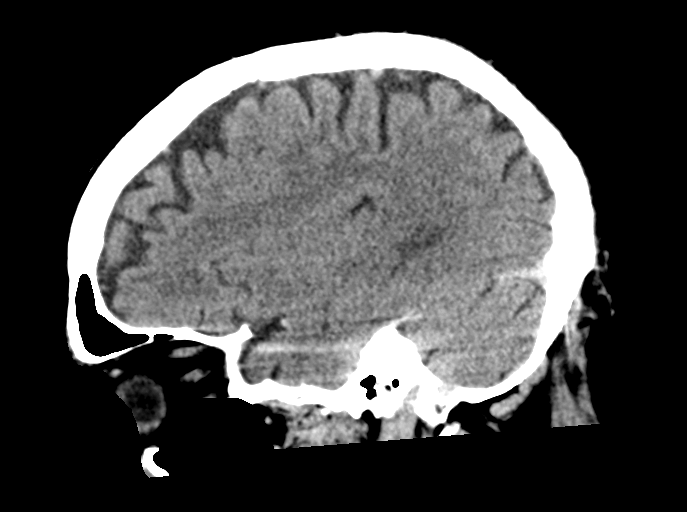

[16 of 47 positions shown; findings below may reference images not displayed]

FINDINGS: Brain: Normal anatomic configuration. No abnormal intra or
extra-axial mass lesion or fluid collection. No abnormal mass effect
or midline shift. No evidence of acute intracranial hemorrhage or
infarct. Ventricular size is normal. Cerebellum unremarkable.

Vascular: Unremarkable

Skull: Intact

Sinuses/Orbits: Paranasal sinuses are clear. Orbits are
unremarkable.

Other: Mastoid air cells and middle ear cavities are clear.
IMPRESSION: No acute intracranial injury.  No calvarial fracture

## 2022-10-29 DIAGNOSIS — E669 Obesity, unspecified: Secondary | ICD-10-CM | POA: Diagnosis not present

## 2022-10-29 DIAGNOSIS — I10 Essential (primary) hypertension: Secondary | ICD-10-CM | POA: Diagnosis not present

## 2022-10-29 DIAGNOSIS — E782 Mixed hyperlipidemia: Secondary | ICD-10-CM | POA: Diagnosis not present

## 2022-10-29 DIAGNOSIS — I255 Ischemic cardiomyopathy: Secondary | ICD-10-CM | POA: Diagnosis not present

## 2022-10-29 DIAGNOSIS — I25110 Atherosclerotic heart disease of native coronary artery with unstable angina pectoris: Secondary | ICD-10-CM | POA: Diagnosis not present

## 2022-10-29 DIAGNOSIS — Z951 Presence of aortocoronary bypass graft: Secondary | ICD-10-CM | POA: Diagnosis not present

## 2022-10-29 DIAGNOSIS — Z955 Presence of coronary angioplasty implant and graft: Secondary | ICD-10-CM | POA: Diagnosis not present

## 2022-10-29 DIAGNOSIS — I480 Paroxysmal atrial fibrillation: Secondary | ICD-10-CM | POA: Diagnosis not present

## 2022-10-29 DIAGNOSIS — I5032 Chronic diastolic (congestive) heart failure: Secondary | ICD-10-CM | POA: Diagnosis not present

## 2022-10-31 ENCOUNTER — Inpatient Hospital Stay
Admission: EM | Admit: 2022-10-31 | Discharge: 2022-11-03 | DRG: 287 | Disposition: A | Discharge status: Home / Self Care | Payer: Medicare HMO | Attending: Hospitalist | Admitting: Hospitalist

## 2022-10-31 ENCOUNTER — Other Ambulatory Visit: Payer: Self-pay

## 2022-10-31 ENCOUNTER — Emergency Department: Payer: Medicare HMO

## 2022-10-31 ENCOUNTER — Encounter: Payer: Self-pay | Admitting: Emergency Medicine

## 2022-10-31 DIAGNOSIS — E669 Obesity, unspecified: Secondary | ICD-10-CM | POA: Diagnosis present

## 2022-10-31 DIAGNOSIS — E782 Mixed hyperlipidemia: Secondary | ICD-10-CM | POA: Diagnosis present

## 2022-10-31 DIAGNOSIS — I4891 Unspecified atrial fibrillation: Secondary | ICD-10-CM

## 2022-10-31 DIAGNOSIS — F419 Anxiety disorder, unspecified: Secondary | ICD-10-CM | POA: Diagnosis present

## 2022-10-31 DIAGNOSIS — Z1152 Encounter for screening for COVID-19: Secondary | ICD-10-CM

## 2022-10-31 DIAGNOSIS — Z7982 Long term (current) use of aspirin: Secondary | ICD-10-CM

## 2022-10-31 DIAGNOSIS — Z885 Allergy status to narcotic agent status: Secondary | ICD-10-CM

## 2022-10-31 DIAGNOSIS — I200 Unstable angina: Secondary | ICD-10-CM

## 2022-10-31 DIAGNOSIS — E1165 Type 2 diabetes mellitus with hyperglycemia: Secondary | ICD-10-CM | POA: Diagnosis present

## 2022-10-31 DIAGNOSIS — I10 Essential (primary) hypertension: Secondary | ICD-10-CM

## 2022-10-31 DIAGNOSIS — Z825 Family history of asthma and other chronic lower respiratory diseases: Secondary | ICD-10-CM

## 2022-10-31 DIAGNOSIS — Z85828 Personal history of other malignant neoplasm of skin: Secondary | ICD-10-CM

## 2022-10-31 DIAGNOSIS — J9610 Chronic respiratory failure, unspecified whether with hypoxia or hypercapnia: Secondary | ICD-10-CM | POA: Diagnosis present

## 2022-10-31 DIAGNOSIS — Z7901 Long term (current) use of anticoagulants: Secondary | ICD-10-CM

## 2022-10-31 DIAGNOSIS — E119 Type 2 diabetes mellitus without complications: Secondary | ICD-10-CM

## 2022-10-31 DIAGNOSIS — R001 Bradycardia, unspecified: Secondary | ICD-10-CM | POA: Diagnosis present

## 2022-10-31 DIAGNOSIS — I110 Hypertensive heart disease with heart failure: Secondary | ICD-10-CM | POA: Diagnosis present

## 2022-10-31 DIAGNOSIS — Z87891 Personal history of nicotine dependence: Secondary | ICD-10-CM

## 2022-10-31 DIAGNOSIS — R0609 Other forms of dyspnea: Secondary | ICD-10-CM | POA: Diagnosis not present

## 2022-10-31 DIAGNOSIS — Z79899 Other long term (current) drug therapy: Secondary | ICD-10-CM | POA: Diagnosis not present

## 2022-10-31 DIAGNOSIS — G4733 Obstructive sleep apnea (adult) (pediatric): Secondary | ICD-10-CM | POA: Diagnosis present

## 2022-10-31 DIAGNOSIS — I959 Hypotension, unspecified: Secondary | ICD-10-CM | POA: Diagnosis present

## 2022-10-31 DIAGNOSIS — E785 Hyperlipidemia, unspecified: Secondary | ICD-10-CM | POA: Diagnosis present

## 2022-10-31 DIAGNOSIS — Z7984 Long term (current) use of oral hypoglycemic drugs: Secondary | ICD-10-CM

## 2022-10-31 DIAGNOSIS — Z955 Presence of coronary angioplasty implant and graft: Secondary | ICD-10-CM

## 2022-10-31 DIAGNOSIS — Z791 Long term (current) use of non-steroidal anti-inflammatories (NSAID): Secondary | ICD-10-CM

## 2022-10-31 DIAGNOSIS — Z6831 Body mass index (BMI) 31.0-31.9, adult: Secondary | ICD-10-CM

## 2022-10-31 DIAGNOSIS — Z8249 Family history of ischemic heart disease and other diseases of the circulatory system: Secondary | ICD-10-CM

## 2022-10-31 DIAGNOSIS — J449 Chronic obstructive pulmonary disease, unspecified: Secondary | ICD-10-CM | POA: Diagnosis present

## 2022-10-31 DIAGNOSIS — Z951 Presence of aortocoronary bypass graft: Secondary | ICD-10-CM | POA: Diagnosis not present

## 2022-10-31 DIAGNOSIS — R079 Chest pain, unspecified: Secondary | ICD-10-CM | POA: Diagnosis not present

## 2022-10-31 DIAGNOSIS — Z888 Allergy status to other drugs, medicaments and biological substances status: Secondary | ICD-10-CM

## 2022-10-31 DIAGNOSIS — I5032 Chronic diastolic (congestive) heart failure: Secondary | ICD-10-CM | POA: Diagnosis present

## 2022-10-31 DIAGNOSIS — I4819 Other persistent atrial fibrillation: Secondary | ICD-10-CM | POA: Diagnosis present

## 2022-10-31 DIAGNOSIS — I480 Paroxysmal atrial fibrillation: Secondary | ICD-10-CM | POA: Diagnosis present

## 2022-10-31 DIAGNOSIS — Z833 Family history of diabetes mellitus: Secondary | ICD-10-CM

## 2022-10-31 DIAGNOSIS — J439 Emphysema, unspecified: Secondary | ICD-10-CM | POA: Diagnosis present

## 2022-10-31 DIAGNOSIS — I252 Old myocardial infarction: Secondary | ICD-10-CM

## 2022-10-31 DIAGNOSIS — Z79891 Long term (current) use of opiate analgesic: Secondary | ICD-10-CM | POA: Diagnosis not present

## 2022-10-31 DIAGNOSIS — I2510 Atherosclerotic heart disease of native coronary artery without angina pectoris: Secondary | ICD-10-CM | POA: Diagnosis present

## 2022-10-31 DIAGNOSIS — Z820 Family history of epilepsy and other diseases of the nervous system: Secondary | ICD-10-CM

## 2022-10-31 DIAGNOSIS — Z882 Allergy status to sulfonamides status: Secondary | ICD-10-CM

## 2022-10-31 DIAGNOSIS — K219 Gastro-esophageal reflux disease without esophagitis: Secondary | ICD-10-CM | POA: Diagnosis present

## 2022-10-31 DIAGNOSIS — J811 Chronic pulmonary edema: Secondary | ICD-10-CM | POA: Diagnosis not present

## 2022-10-31 DIAGNOSIS — R0789 Other chest pain: Secondary | ICD-10-CM | POA: Diagnosis not present

## 2022-10-31 DIAGNOSIS — I25110 Atherosclerotic heart disease of native coronary artery with unstable angina pectoris: Secondary | ICD-10-CM | POA: Diagnosis present

## 2022-10-31 DIAGNOSIS — E1121 Type 2 diabetes mellitus with diabetic nephropathy: Secondary | ICD-10-CM

## 2022-10-31 DIAGNOSIS — G894 Chronic pain syndrome: Secondary | ICD-10-CM | POA: Diagnosis present

## 2022-10-31 DIAGNOSIS — Z818 Family history of other mental and behavioral disorders: Secondary | ICD-10-CM

## 2022-10-31 LAB — RESP PANEL BY RT-PCR (RSV, FLU A&B, COVID)  RVPGX2
Influenza A by PCR: NEGATIVE
Influenza B by PCR: NEGATIVE
Resp Syncytial Virus by PCR: NEGATIVE
SARS Coronavirus 2 by RT PCR: NEGATIVE

## 2022-10-31 LAB — APTT: aPTT: 38 seconds — ABNORMAL HIGH (ref 24–36)

## 2022-10-31 LAB — CBC WITH DIFFERENTIAL/PLATELET
Abs Immature Granulocytes: 0.04 10*3/uL (ref 0.00–0.07)
Basophils Absolute: 0.1 10*3/uL (ref 0.0–0.1)
Basophils Relative: 1 %
Eosinophils Absolute: 0.4 10*3/uL (ref 0.0–0.5)
Eosinophils Relative: 5 %
HCT: 52.6 % — ABNORMAL HIGH (ref 39.0–52.0)
Hemoglobin: 16.7 g/dL (ref 13.0–17.0)
Immature Granulocytes: 0 %
Lymphocytes Relative: 23 %
Lymphs Abs: 2.1 10*3/uL (ref 0.7–4.0)
MCH: 29.1 pg (ref 26.0–34.0)
MCHC: 31.7 g/dL (ref 30.0–36.0)
MCV: 91.8 fL (ref 80.0–100.0)
Monocytes Absolute: 0.8 10*3/uL (ref 0.1–1.0)
Monocytes Relative: 9 %
Neutro Abs: 5.7 10*3/uL (ref 1.7–7.7)
Neutrophils Relative %: 62 %
Platelets: 221 10*3/uL (ref 150–400)
RBC: 5.73 MIL/uL (ref 4.22–5.81)
RDW: 13.6 % (ref 11.5–15.5)
WBC: 9.2 10*3/uL (ref 4.0–10.5)
nRBC: 0 % (ref 0.0–0.2)

## 2022-10-31 LAB — COMPREHENSIVE METABOLIC PANEL
ALT: 13 U/L (ref 0–44)
AST: 19 U/L (ref 15–41)
Albumin: 3.7 g/dL (ref 3.5–5.0)
Alkaline Phosphatase: 80 U/L (ref 38–126)
Anion gap: 8 (ref 5–15)
BUN: 17 mg/dL (ref 8–23)
CO2: 29 mmol/L (ref 22–32)
Calcium: 9.1 mg/dL (ref 8.9–10.3)
Chloride: 104 mmol/L (ref 98–111)
Creatinine, Ser: 0.87 mg/dL (ref 0.61–1.24)
GFR, Estimated: >60 mL/min (ref >60)
Glucose, Bld: 145 mg/dL — ABNORMAL HIGH (ref 70–99)
Potassium: 4 mmol/L (ref 3.5–5.1)
Sodium: 141 mmol/L (ref 135–145)
Total Bilirubin: 0.5 mg/dL (ref 0.3–1.2)
Total Protein: 7.3 g/dL (ref 6.5–8.1)

## 2022-10-31 LAB — URINE DRUG SCREEN, QUALITATIVE (ARMC ONLY)
Amphetamines, Ur Screen: NOT DETECTED
Barbiturates, Ur Screen: NOT DETECTED
Benzodiazepine, Ur Scrn: POSITIVE — AB
Cannabinoid 50 Ng, Ur ~~LOC~~: POSITIVE — AB
Cocaine Metabolite,Ur ~~LOC~~: NOT DETECTED
MDMA (Ecstasy)Ur Screen: NOT DETECTED
Methadone Scn, Ur: NOT DETECTED
Opiate, Ur Screen: POSITIVE — AB
Phencyclidine (PCP) Ur S: NOT DETECTED
Tricyclic, Ur Screen: NOT DETECTED

## 2022-10-31 LAB — TROPONIN I (HIGH SENSITIVITY)
Troponin I (High Sensitivity): 14 ng/L (ref <18)
Troponin I (High Sensitivity): 15 ng/L (ref <18)
Troponin I (High Sensitivity): 12 ng/L (ref <18)

## 2022-10-31 LAB — LIPASE, BLOOD: Lipase: 88 U/L — ABNORMAL HIGH (ref 11–51)

## 2022-10-31 LAB — PROTIME-INR
INR: 1.3 — ABNORMAL HIGH (ref 0.8–1.2)
Prothrombin Time: 16.3 seconds — ABNORMAL HIGH (ref 11.4–15.2)

## 2022-10-31 LAB — GLUCOSE, CAPILLARY
Glucose-Capillary: 134 mg/dL — ABNORMAL HIGH (ref 70–99)
Glucose-Capillary: 163 mg/dL — ABNORMAL HIGH (ref 70–99)

## 2022-10-31 LAB — BRAIN NATRIURETIC PEPTIDE: B Natriuretic Peptide: 301.7 pg/mL — ABNORMAL HIGH (ref 0.0–100.0)

## 2022-10-31 LAB — HEPARIN LEVEL (UNFRACTIONATED): Heparin Unfractionated: >1.1 IU/mL — ABNORMAL HIGH (ref 0.30–0.70)

## 2022-10-31 LAB — CBG MONITORING, ED
Glucose-Capillary: 134 mg/dL — ABNORMAL HIGH (ref 70–99)
Glucose-Capillary: 118 mg/dL — ABNORMAL HIGH (ref 70–99)

## 2022-10-31 MED ORDER — NITROGLYCERIN 2 % TD OINT
1.0000 [in_us] | TOPICAL_OINTMENT | Freq: Once | TRANSDERMAL | Status: AC
  Administered 2022-10-31: 1 [in_us] via TOPICAL
  Filled 2022-10-31: qty 1

## 2022-10-31 MED ORDER — MORPHINE SULFATE (PF) 4 MG/ML IV SOLN
4.0000 mg | Freq: Once | INTRAVENOUS | Status: AC
  Administered 2022-10-31: 4 mg via INTRAVENOUS
  Filled 2022-10-31: qty 1

## 2022-10-31 MED ORDER — NITROGLYCERIN 2 % TD OINT
1.0000 [in_us] | TOPICAL_OINTMENT | Freq: Four times a day (QID) | TRANSDERMAL | Status: DC | PRN
  Administered 2022-11-02: 1 [in_us] via TOPICAL
  Filled 2022-10-31: qty 1

## 2022-10-31 MED ORDER — ASPIRIN 81 MG PO CHEW: 81.0000 mg | CHEWABLE_TABLET | ORAL | Status: DC

## 2022-10-31 MED ORDER — INSULIN ASPART 100 UNIT/ML IJ SOLN
0.0000 [IU] | Freq: Three times a day (TID) | INTRAMUSCULAR | Status: DC
  Administered 2022-10-31 – 2022-11-01 (×3): 2 [IU] via SUBCUTANEOUS
  Administered 2022-11-01: 3 [IU] via SUBCUTANEOUS
  Administered 2022-11-01 – 2022-11-03 (×2): 2 [IU] via SUBCUTANEOUS
  Filled 2022-10-31 (×6): qty 1

## 2022-10-31 MED ORDER — ASPIRIN 81 MG PO CHEW
81.0000 mg | CHEWABLE_TABLET | Freq: Every day | ORAL | Status: DC
  Administered 2022-10-31 – 2022-11-03 (×4): 81 mg via ORAL
  Filled 2022-10-31 (×4): qty 1

## 2022-10-31 MED ORDER — LISINOPRIL 10 MG PO TABS
10.0000 mg | ORAL_TABLET | Freq: Every day | ORAL | Status: DC
  Administered 2022-10-31 – 2022-11-01 (×2): 10 mg via ORAL
  Filled 2022-10-31 (×2): qty 1

## 2022-10-31 MED ORDER — HEPARIN (PORCINE) 25000 UT/250ML-% IV SOLN
1300.0000 [IU]/h | INTRAVENOUS | Status: DC
  Administered 2022-10-31: 1100 [IU]/h via INTRAVENOUS
  Administered 2022-11-01 – 2022-11-02 (×2): 1300 [IU]/h via INTRAVENOUS
  Filled 2022-10-31 (×3): qty 250

## 2022-10-31 MED ORDER — ACETAMINOPHEN 325 MG PO TABS: 650.0000 mg | ORAL_TABLET | Freq: Four times a day (QID) | ORAL | Status: DC | PRN

## 2022-10-31 MED ORDER — APIXABAN 5 MG PO TABS
5.0000 mg | ORAL_TABLET | Freq: Two times a day (BID) | ORAL | Status: DC
  Administered 2022-10-31: 5 mg via ORAL
  Filled 2022-10-31: qty 1

## 2022-10-31 MED ORDER — ONDANSETRON HCL 4 MG/2ML IJ SOLN
4.0000 mg | Freq: Four times a day (QID) | INTRAMUSCULAR | Status: DC | PRN
  Administered 2022-11-01 – 2022-11-02 (×3): 4 mg via INTRAVENOUS
  Filled 2022-10-31 (×3): qty 2

## 2022-10-31 MED ORDER — PANTOPRAZOLE SODIUM 40 MG PO TBEC
40.0000 mg | DELAYED_RELEASE_TABLET | Freq: Every day | ORAL | Status: DC
  Administered 2022-10-31 – 2022-11-03 (×4): 40 mg via ORAL
  Filled 2022-10-31 (×4): qty 1

## 2022-10-31 MED ORDER — SODIUM CHLORIDE 0.9% FLUSH: 3.0000 mL | INTRAVENOUS | Status: DC | PRN

## 2022-10-31 MED ORDER — ALPRAZOLAM 0.5 MG PO TABS
1.0000 mg | ORAL_TABLET | Freq: Three times a day (TID) | ORAL | Status: DC
  Administered 2022-10-31 – 2022-11-03 (×7): 1 mg via ORAL
  Filled 2022-10-31 (×7): qty 2

## 2022-10-31 MED ORDER — FLUTICASONE FUROATE-VILANTEROL 100-25 MCG/ACT IN AEPB
1.0000 | INHALATION_SPRAY | Freq: Every day | RESPIRATORY_TRACT | Status: DC
  Administered 2022-11-01 – 2022-11-03 (×3): 1 via RESPIRATORY_TRACT
  Filled 2022-10-31: qty 28

## 2022-10-31 MED ORDER — SODIUM CHLORIDE 0.9 % WEIGHT BASED INFUSION: 3.0000 mL/kg/h | INTRAVENOUS | Status: DC

## 2022-10-31 MED ORDER — ISOSORBIDE MONONITRATE ER 60 MG PO TB24
180.0000 mg | ORAL_TABLET | Freq: Every day | ORAL | Status: DC
  Administered 2022-10-31 – 2022-11-03 (×4): 180 mg via ORAL
  Filled 2022-10-31 (×4): qty 3

## 2022-10-31 MED ORDER — DILTIAZEM HCL ER COATED BEADS 180 MG PO CP24
180.0000 mg | ORAL_CAPSULE | Freq: Every day | ORAL | Status: DC
  Administered 2022-10-31 – 2022-11-01 (×2): 180 mg via ORAL
  Filled 2022-10-31 (×2): qty 1

## 2022-10-31 MED ORDER — SODIUM CHLORIDE 0.9% FLUSH
3.0000 mL | Freq: Two times a day (BID) | INTRAVENOUS | Status: DC
  Administered 2022-11-01 – 2022-11-02 (×3): 3 mL via INTRAVENOUS

## 2022-10-31 MED ORDER — ONDANSETRON HCL 4 MG/2ML IJ SOLN
4.0000 mg | Freq: Once | INTRAMUSCULAR | Status: AC
  Administered 2022-10-31: 4 mg via INTRAVENOUS
  Filled 2022-10-31: qty 2

## 2022-10-31 MED ORDER — RANOLAZINE ER 500 MG PO TB12
1000.0000 mg | ORAL_TABLET | Freq: Two times a day (BID) | ORAL | Status: DC
  Administered 2022-10-31 – 2022-11-03 (×7): 1000 mg via ORAL
  Filled 2022-10-31 (×7): qty 2

## 2022-10-31 MED ORDER — ACETAMINOPHEN 650 MG RE SUPP: 650.0000 mg | Freq: Four times a day (QID) | RECTAL | Status: DC | PRN

## 2022-10-31 MED ORDER — AMIODARONE HCL 200 MG PO TABS
200.0000 mg | ORAL_TABLET | Freq: Two times a day (BID) | ORAL | Status: DC
  Administered 2022-10-31 – 2022-11-03 (×7): 200 mg via ORAL
  Filled 2022-10-31 (×7): qty 1

## 2022-10-31 MED ORDER — HYDROMORPHONE HCL 1 MG/ML IJ SOLN
0.5000 mg | Freq: Once | INTRAMUSCULAR | Status: DC
  Filled 2022-10-31: qty 0.5

## 2022-10-31 MED ORDER — SODIUM CHLORIDE 0.9 % WEIGHT BASED INFUSION: 1.0000 mL/kg/h | INTRAVENOUS | Status: DC

## 2022-10-31 MED ORDER — NITROGLYCERIN 0.4 MG SL SUBL: 0.4000 mg | SUBLINGUAL_TABLET | SUBLINGUAL | Status: DC | PRN

## 2022-10-31 MED ORDER — ONDANSETRON HCL 4 MG PO TABS: 4.0000 mg | ORAL_TABLET | Freq: Four times a day (QID) | ORAL | Status: DC | PRN

## 2022-10-31 MED ORDER — MORPHINE SULFATE (PF) 4 MG/ML IV SOLN
4.0000 mg | INTRAVENOUS | Status: DC | PRN
  Administered 2022-10-31 – 2022-11-02 (×9): 4 mg via INTRAVENOUS
  Filled 2022-10-31 (×10): qty 1

## 2022-10-31 MED ORDER — MORPHINE SULFATE (PF) 2 MG/ML IV SOLN
2.0000 mg | INTRAVENOUS | Status: DC | PRN
  Administered 2022-10-31 (×3): 2 mg via INTRAVENOUS
  Filled 2022-10-31 (×3): qty 1

## 2022-10-31 MED ORDER — TORSEMIDE 20 MG PO TABS
50.0000 mg | ORAL_TABLET | Freq: Every day | ORAL | Status: DC
  Administered 2022-10-31 – 2022-11-01 (×2): 50 mg via ORAL
  Filled 2022-10-31 (×2): qty 3

## 2022-10-31 MED ORDER — OXYCODONE HCL 5 MG PO TABS
5.0000 mg | ORAL_TABLET | ORAL | Status: DC | PRN
  Administered 2022-10-31 – 2022-11-02 (×2): 5 mg via ORAL
  Filled 2022-10-31 (×2): qty 1

## 2022-10-31 MED ORDER — ATORVASTATIN CALCIUM 80 MG PO TABS
80.0000 mg | ORAL_TABLET | Freq: Every day | ORAL | Status: DC
  Administered 2022-10-31 – 2022-11-02 (×3): 80 mg via ORAL
  Filled 2022-10-31 (×3): qty 1

## 2022-10-31 MED ORDER — INSULIN ASPART 100 UNIT/ML IJ SOLN: 0.0000 [IU] | Freq: Every day | INTRAMUSCULAR | Status: DC

## 2022-10-31 MED ORDER — SODIUM CHLORIDE 0.9 % IV SOLN: 250.0000 mL | INTRAVENOUS | Status: DC | PRN

## 2022-10-31 MED ORDER — METOPROLOL SUCCINATE ER 50 MG PO TB24
50.0000 mg | ORAL_TABLET | Freq: Every day | ORAL | Status: DC
  Administered 2022-10-31 – 2022-11-01 (×2): 50 mg via ORAL
  Filled 2022-10-31 (×2): qty 1

## 2022-10-31 MED ORDER — TIOTROPIUM BROMIDE MONOHYDRATE 18 MCG IN CAPS
18.0000 ug | ORAL_CAPSULE | Freq: Every day | RESPIRATORY_TRACT | Status: DC
  Administered 2022-11-01 – 2022-11-03 (×3): 18 ug via RESPIRATORY_TRACT
  Filled 2022-10-31: qty 5

## 2022-10-31 NOTE — ED Notes (Signed)
Assumed care from Gwyndolyn Saxon, [place]. Pt resting comfortably in bed at this time. Pt requesting pain medications. Call light with in reach.

## 2022-10-31 NOTE — Consult Note (Signed)
ANTICOAGULATION CONSULT NOTE - Initial Consult  Pharmacy Consult for Heparin infusion Indication: chest pain/ACS  Allergies  Allergen Reactions   Prednisone Other (See Comments) and Hypertension    Pt states that this medication puts him in A-fib    Demerol  [Meperidine Hcl]    Demerol [Meperidine] Hives   Jardiance [Empagliflozin] Other (See Comments)    Perineal pain   Sulfa Antibiotics Hives   Albuterol Sulfate [Albuterol] Palpitations and Other (See Comments)    Pt currently uses this medication.     Morphine Sulfate Nausea And Vomiting, Rash and Other (See Comments)    Pt states that he is only allergic to the tablet form of this medication.      Patient Measurements: Height: 5' 7"  (170.2 cm) Weight: 91.2 kg (201 lb) IBW/kg (Calculated) : 66.1 Heparin Dosing Weight: 85.2 kg  Vital Signs: Temp: 97.7 F (36.5 C) (12/16 1419) Temp Source: Oral (12/16 0900) BP: 110/89 (12/16 1718) Pulse Rate: 18 (12/16 1718)  Labs: Recent Labs    10/31/22 0116 10/31/22 0341 10/31/22 1002 10/31/22 1841  HGB 16.7  --   --   --   HCT 52.6*  --   --   --   PLT 221  --   --   --   APTT  --   --   --  38*  LABPROT  --   --   --  16.3*  INR  --   --   --  1.3*  HEPARINUNFRC  --   --   --  >1.10*  CREATININE 0.87  --   --   --   TROPONINIHS 14 15 12   --     Estimated Creatinine Clearance: 87.5 mL/min (by C-G formula based on SCr of 0.87 mg/dL).   Medical History: Past Medical History:  Diagnosis Date   A-fib ([place])    Anemia    Anginal pain ([place])    Anxiety    Arthritis    Asthma    CAD (coronary artery disease)    a. 2002 CABGx2 (LIMA->LAD, VG->VG->OM1);  b. 09/2012 DES->OM;  c. 03/2015 PTCA of LAD [facility]) in setting of atretic LIMA; d. 05/2015 Cath [facility]): nonobs dzs; e. 06/2015 Cath ([facility]): LM nl, LAD 45p/d ISR, 50d, D1/2 small, LCX 50p/d ISR, OM1 70ost, 30 ISR, VG->OM1 50ost, 43m LIMA->LAD 99p/d - atretic, RCA dom, nl; f.cath 10/16: 40-50%(FFR 0.90) pLAD, 75% (FFR 0.77) mLAD s/p  PCI/DES, oRCA 40% (FFR0.95)   Cancer (HCC)    SKIN CANCER ON BACK   Celiac disease    Chronic diastolic CHF (congestive heart failure) ([place]    a. 06/2009 Echo: EF 60-65%, Gr 1 DD, triv AI, mildly dil LA, nl RV.   COPD (chronic obstructive pulmonary disease) ([place]    a. Chronic bronchitis and emphysema.   DDD (degenerative disc disease), lumbar    Diverticulosis    Dysrhythmia    Essential hypertension    GERD (gastroesophageal reflux disease)    History of hiatal hernia    History of kidney stones    H/O   History of tobacco abuse    a. Quit 2014.   Myocardial infarction ([facility] 2002   4 STENTS   Pancreatitis    PSVT (paroxysmal supraventricular tachycardia)    a. 10/2012 Noted on Zio Patch.   Sleep apnea    LOST CORD TO CPAP -ONLY 02 @ BEDTIME   Tubular adenoma of colon    Type II diabetes mellitus ([place]  Medications:  Apixaban PTA, last dose received as inpatient 12/16 @ 0919  Assessment: Patient admitted with chest pain. PMH includes HTN, DM2, Chronic Respiratory Failure on 2L [place], CAD s/p CABG x 2, 8841 PCI complicated by dissection, and multiple subsequent stents, and Afib on Amio and Eliquis. Pharmacy consulted to manage heparin infusion for ACS/STEMI with planned LHC on Monday.  Baseline aPTT and heparin level ordered.  Goal of Therapy:  Heparin level 0.3-0.7 units/ml aPTT 66-102 seconds Monitor platelets by anticoagulation protocol: Yes   Plan:  No bolus , last DOAC dose given < 10 hrs ago Start heparin infusion at 1100 units/hr Check aPTT level 6 hours after heparin infusion start time Continue to monitor H&H and platelets  Mairead Schwarzkopf Rodriguez-Guzman PharmD, BCPS 10/31/2022 7:14 PM

## 2022-10-31 NOTE — Assessment & Plan Note (Signed)
>>  ASSESSMENT AND PLAN FOR PAROXYSMAL ATRIAL FIBRILLATION WRITTEN ON 03/01/2023 12:37 PM BY Malva Limes, MD  >>ASSESSMENT AND PLAN FOR ATRIAL FIBRILLATION WRITTEN ON 10/31/2022  5:51 AM BY ZIERLE-GHOSH, ASIA B, DO  - Continue amiodarone and Eliquis

## 2022-10-31 NOTE — ED Triage Notes (Signed)
Pt BIB A-EMS from home c/o recurrent left sided, heavy/sharp-like CP. Pt states he followed up with cardiology on Thursday for CP and refused admission at that time but was recommended ER visit if CP worsen. Today at 10 pm while pt was a rest CP worsen. CP similar to previous episodes in the past. Pain associated with nausea and SOB. Pt self administered 3 NTG and 3 baby aspirin prior to ED arrival.

## 2022-10-31 NOTE — H&P (Signed)
History and Physical    Patient: Lawrence [name] FFM:[id number] DOB: [birth year] DOA: 10/31/2022 DOS: the patient was seen and examined on 10/31/2022 PCP: Birdie Sons, MD  Patient coming from: Home  Chief Complaint:  Chief Complaint  Patient presents with   Chest Pain   HPI: Lawrence [name] is a 66 y.o. male with medical history significant of  A-fib, anginal pain, coronary artery disease, chronic diastolic CHF, COPD, GERD, essential hypertension, type 2 diabetes mellitus, sleep apnea, history of cardiac stents, and more presents the ED with a chief complaint of chest pain.  Patient reports his chest pain came in all of a sudden while he was sitting still.  Worse with exertion.  He reports it is substernal with no radiation.  He feels lipid dull ache.  After multiple doses of morphine, Nitropaste, patient reports his pain is 9 out of 10.  At its worst it was a 10 out of 10.  He does have associated shortness of breath.  He does wear 2 L nasal cannula at home.  Patient reports she has had some nausea but no vomiting.  He has had some diaphoresis as well.  The symptoms all feel like the same constellation of symptoms as when he required his last stent.  Patient recently saw his cardiologist couple days ago, and they were planning for a repeat cath.  Since patient was here in the ER and the ER provider called the cardiologist who recommended admission.  No further complaints at this time. Review of Systems: As mentioned in the history of present illness. All other systems reviewed and are negative. Past Medical History:  Diagnosis Date   A-fib ([place])    Anemia    Anginal pain ([place])    Anxiety    Arthritis    Asthma    CAD (coronary artery disease)    a. 2002 CABGx2 (LIMA->LAD, VG->VG->OM1);  b. 09/2012 DES->OM;  c. 03/2015 PTCA of LAD [facility]) in setting of atretic LIMA; d. 05/2015 Cath [facility]): nonobs dzs; e. 06/2015 Cath ([facility]): LM nl, LAD 45p/d ISR, 50d, D1/2 small, LCX 50p/d ISR, OM1 70ost, 30  ISR, VG->OM1 50ost, 93m LIMA->LAD 99p/d - atretic, RCA dom, nl; f.cath 10/16: 40-50%(FFR 0.90) pLAD, 75% (FFR 0.77) mLAD s/p PCI/DES, oRCA 40% (FFR0.95)   Cancer (HCC)    SKIN CANCER ON BACK   Celiac disease    Chronic diastolic CHF (congestive heart failure) ([place]    a. 06/2009 Echo: EF 60-65%, Gr 1 DD, triv AI, mildly dil LA, nl RV.   COPD (chronic obstructive pulmonary disease) ([place]    a. Chronic bronchitis and emphysema.   DDD (degenerative disc disease), lumbar    Diverticulosis    Dysrhythmia    Essential hypertension    GERD (gastroesophageal reflux disease)    History of hiatal hernia    History of kidney stones    H/O   History of tobacco abuse    a. Quit 2014.   Myocardial infarction ([facility] 2002   4 STENTS   Pancreatitis    PSVT (paroxysmal supraventricular tachycardia)    a. 10/2012 Noted on Zio Patch.   Sleep apnea    LOST CORD TO CPAP -ONLY 02 @ BEDTIME   Tubular adenoma of colon    Type II diabetes mellitus ([place]    Past Surgical History:  Procedure Laterality Date   BYPASS GRAFT     CARDIAC CATHETERIZATION N/A 07/12/2015   rocedure: Left Heart Cath and Cors/Grafts Angiography;  Surgeon: HLynnell Dike  Tamala Julian, MD;  Location: [place] [facility];  Service: Cardiovascular;  Laterality: N/A;   CARDIAC CATHETERIZATION Right 10/07/2015   Procedure: Left Heart Cath and Cors/Grafts Angiography;  Surgeon: Dionisio David, MD;  Location: [place] [facility];  Service: Cardiovascular;  Laterality: Right;   CARDIAC CATHETERIZATION N/A 04/06/2016   Procedure: Left Heart Cath and Coronary Angiography;  Surgeon: Yolonda Kida, MD;  Location: [place] [facility];  Service: Cardiovascular;  Laterality: N/A;   CARDIAC CATHETERIZATION  04/06/2016   Procedure: Bypass Graft Angiography;  Surgeon: Yolonda Kida, MD;  Location: [place] [facility];  Service: Cardiovascular;;   CARDIAC CATHETERIZATION N/A 11/02/2016   Procedure: Left Heart Cath and Cors/Grafts Angiography and possible  PCI;  Surgeon: Yolonda Kida, MD;  Location: [place] [facility];  Service: Cardiovascular;  Laterality: N/A;   CARDIAC CATHETERIZATION N/A 11/02/2016   Procedure: Coronary Stent Intervention;  Surgeon: Yolonda Kida, MD;  Location: [place] [facility];  Service: Cardiovascular;  Laterality: N/A;   CARDIOVERSION N/A 06/29/2022   Procedure: CARDIOVERSION;  Surgeon: Corey Skains, MD;  Location: [facility] ORS;  Service: Cardiovascular;  Laterality: N/A;   CHOLECYSTECTOMY     CIRCUMCISION N/A 06/09/2019   Procedure: CIRCUMCISION ADULT;  Surgeon: Billey Co, MD;  Location: [facility] ORS;  Service: Urology;  Laterality: N/A;   COLONOSCOPY WITH PROPOFOL N/A 04/01/2018   Procedure: COLONOSCOPY WITH PROPOFOL;  Surgeon: Manya Silvas, MD;  Location: [facility] ENDOSCOPY;  Service: Endoscopy;  Laterality: N/A;   ESOPHAGEAL DILATION     ESOPHAGOGASTRODUODENOSCOPY (EGD) WITH PROPOFOL N/A 04/01/2018   Procedure: ESOPHAGOGASTRODUODENOSCOPY (EGD) WITH PROPOFOL;  Surgeon: Manya Silvas, MD;  Location: [facility] ENDOSCOPY;  Service: Endoscopy;  Laterality: N/A;   LEFT HEART CATH AND CORS/GRAFTS ANGIOGRAPHY N/A 06/12/2019   Procedure: LEFT HEART CATH AND CORS/GRAFTS ANGIOGRAPHY;  Surgeon: Teodoro Spray, MD;  Location: [place] [facility];  Service: Cardiovascular;  Laterality: N/A;   LEFT HEART CATH AND CORS/GRAFTS ANGIOGRAPHY N/A 03/11/2020   Procedure: LEFT HEART CATH AND CORS/GRAFTS ANGIOGRAPHY;  Surgeon: Isaias Cowman, MD;  Location: [place] [facility];  Service: Cardiovascular;  Laterality: N/A;   LEFT HEART CATH AND CORS/GRAFTS ANGIOGRAPHY N/A 05/01/2021   Procedure: LEFT HEART CATH AND CORS/GRAFTS ANGIOGRAPHY;  Surgeon: Corey Skains, MD;  Location: [place] [facility];  Service: Cardiovascular;  Laterality: N/A;   TONSILLECTOMY     VASCULAR SURGERY     Social History:  reports that he quit smoking about 9 years ago. His smoking use included cigarettes. He has a 150.00 pack-year smoking  history. He has never used smokeless tobacco. He reports current drug use. Drug: Marijuana. He reports that he does not drink alcohol.  Allergies  Allergen Reactions   Prednisone Other (See Comments) and Hypertension    Pt states that this medication puts him in A-fib    Demerol  [Meperidine Hcl]    Demerol [Meperidine] Hives   Jardiance [Empagliflozin] Other (See Comments)    Perineal pain   Sulfa Antibiotics Hives   Albuterol Sulfate [Albuterol] Palpitations and Other (See Comments)    Pt currently uses this medication.     Morphine Sulfate Nausea And Vomiting, Rash and Other (See Comments)    Pt states that he is only allergic to the tablet form of this medication.      Family History  Problem Relation Age of Onset   Heart attack Mother    Depression Mother    Heart disease Mother  COPD Mother    Hypertension Mother    Heart attack Father    Diabetes Father    Depression Father    Heart disease Father    Cirrhosis Father    Parkinson's disease Brother     Prior to Admission medications   Medication Sig Start Date End Date Taking? Authorizing Provider  Accu-Chek Softclix Lancets lancets Use as instructed to check sugar daily for type 2 diabetes. 03/03/21   Birdie Sons, MD  acetaminophen (TYLENOL) 325 MG tablet Take 2 tablets (650 mg total) by mouth every 4 (four) hours as needed for headache or mild pain. 08/07/22   Lavina Hamman, MD  albuterol (VENTOLIN HFA) 108 (90 Base) MCG/ACT inhaler INHALE 2 PUFFS BY MOUTH EVERY 6 HOURS AS NEEDED FOR SHORTNESS OF BREATH 12/02/21   Birdie Sons, MD  allopurinol (ZYLOPRIM) 300 MG tablet TAKE 1 TABLET BY MOUTH TWICE A DAY 10/15/21   Birdie Sons, MD  ALPRAZolam Duanne Moron) 1 MG tablet Take 1 tablet (1 mg total) by mouth 3 (three) times daily. 09/11/22   Birdie Sons, MD  amiodarone (PACERONE) 200 MG tablet Take 200 mg by mouth 2 (two) times daily. 07/06/22   [provider]  aspirin 81 MG chewable tablet Chew 81 mg  by mouth daily. Swallows whole    [provider]  atorvastatin (LIPITOR) 80 MG tablet TAKE 1 TABLET BY MOUTH AT BEDTIME 04/16/22   Birdie Sons, MD  BELSOMRA 5 MG TABS TAKE 1 TABLET (5 MG TOTAL) BY MOUTH AT BEDTIME AS NEEDED 06/17/22   Birdie Sons, MD  Blood Glucose Calibration (ACCU-CHEK GUIDE CONTROL) LIQD Use with blood glucose monitor as directed 02/20/21   Birdie Sons, MD  Blood Glucose Monitoring Suppl (ACCU-CHEK GUIDE) w/Device KIT Use to check blood sugars as directed 02/20/21   Birdie Sons, MD  BREO ELLIPTA 100-25 MCG/ACT AEPB INHALE 1 PUFF BY MOUTH INTO THE LUNGS DAILY. 08/31/22   Birdie Sons, MD  celecoxib (CELEBREX) 200 MG capsule Take 1 capsule by mouth 2 (two) times daily. 11/03/21   [provider]  cetirizine (ZYRTEC) 10 MG tablet TAKE 1 TABLET BY MOUTH AT BEDTIME 04/16/22   Birdie Sons, MD  diltiazem (CARDIZEM CD) 180 MG 24 hr capsule Take 1 capsule (180 mg total) by mouth daily. 07/09/22   Lorella Nimrod, MD  docusate sodium (COLACE) 100 MG capsule Take 100 mg by mouth daily as needed for mild constipation. Patient not taking: Reported on 09/10/2022    [provider]  ELIQUIS 5 MG TABS tablet TAKE 1 TABLET BY MOUTH TWICE A DAY 07/30/22   Birdie Sons, MD  FARXIGA 10 MG TABS tablet TAKE 1 TABLET BY MOUTH DAILY 02/09/22   Birdie Sons, MD  glucose blood (ACCU-CHEK GUIDE) test strip Use as instructed to check sugar daily for type 2 diabetes. 03/03/21   Birdie Sons, MD  isosorbide mononitrate (IMDUR) 60 MG 24 hr tablet TAKE 3 TABLETS BY MOUTH EVERY DAY. 10/20/22   Birdie Sons, MD  linaclotide (LINZESS) 72 MCG capsule Take 72 mcg by mouth daily before breakfast.    [provider]  lisinopril (ZESTRIL) 10 MG tablet TAKE 1 TABLET BY MOUTH DAILY 07/30/22   Birdie Sons, MD  meclizine (ANTIVERT) 25 MG tablet TAKE ONE TABLET BY THREE TIMES A DAY AS NEEDED FOR DIZZINESS 08/13/22   Birdie Sons, MD  metFORMIN  (GLUCOPHAGE-XR) 500 MG 24 hr tablet TAKE  1 TABLET BY MOUTH TWICE A DAY 10/05/22   Birdie Sons, MD  methocarbamol (ROBAXIN) 500 MG tablet Take 500 mg by mouth every 8 (eight) hours as needed for muscle spasms. Take 1 tablet by mouth every hour as needed for muscle spasms    [name], [name], MD  metoprolol tartrate (LOPRESSOR) 25 MG tablet Take 1 tablet (25 mg total) by mouth 2 (two) times daily. 08/07/22   Lavina Hamman, MD  naloxone [facility]) nasal spray 4 mg/0.1 mL 1 spray into nostril x1 and may repeat every 2-3 minutes until patient is responsive or EMS arrives 08/21/20   Birdie Sons, MD  nitroGLYCERIN (NITROSTAT) 0.4 MG SL tablet Place 1 tablet (0.4 mg total) under the tongue every 5 (five) minutes as needed for chest pain. 09/10/22   Mikey Kirschner, PA-C  omega-3 acid ethyl esters (LOVAZA) 1 g capsule Take 4 capsules (4 g total) by mouth daily. 09/10/22   Mikey Kirschner, PA-C  omeprazole (PRILOSEC) 40 MG capsule TAKE 1 CAPSULE BY MOUTH 2 TIMES DAILY 30 MINUTES BEFORE BREAKFAST AND DINNER 07/15/22   Birdie Sons, MD  ondansetron (ZOFRAN-ODT) 4 MG disintegrating tablet Take 1 tablet (4 mg total) by mouth every 8 (eight) hours as needed for nausea or vomiting. Patient not taking: Reported on 09/10/2022 04/08/22   Birdie Sons, MD  oxyCODONE-acetaminophen (PERCOCET) 10-325 MG tablet TAKE 1 TABLET BY MOUTH EVERY 4 HOURS AS NEEDED FOR PAIN. 10/10/22   Birdie Sons, MD  ranolazine (RANEXA) 1000 MG SR tablet Take 1 tablet (1,000 mg total) by mouth 2 (two) times daily. 04/02/21   Birdie Sons, MD  SPIRIVA HANDIHALER 18 MCG inhalation capsule PLACE 1 CAPSULE INTO INHALER AND INHALE BY MOUTH ONCE A DAY. 06/02/22   Birdie Sons, MD  torsemide (DEMADEX) 100 MG tablet Take 0.5 tablets (50 mg total) by mouth daily. 09/10/22   Mikey Kirschner, PA-C  traZODone (DESYREL) 150 MG tablet TAKE 1 TABLET BY MOUTH AT BEDTIME 09/01/21   Birdie Sons, MD  venlafaxine XR (EFFEXOR-XR) 75 MG 24  hr capsule TAKE 1 CAPSULE BY MOUTH DAILY WITH BREAKFAST 12/16/21   Birdie Sons, MD    Physical Exam: Vitals:   10/31/22 0500 10/31/22 0530 10/31/22 0537 10/31/22 0542  BP: (!) 163/89 (!) 151/99    Pulse: 66 64  69  Resp: _0 Temp:      TempSrc:      SpO2: 94% (!) 89% 91% 93%  Weight:      Height:        1.  General: Patient lying supine in bed,  no acute distress   2. Psychiatric: Alert and oriented x 3, mood and behavior normal for situation, pleasant and cooperative with exam   3. Neurologic: Speech and language are normal, face is symmetric, moves all 4 extremities voluntarily, at baseline without acute deficits on limited exam   4. HEENMT:  Head is atraumatic, normocephalic, pupils reactive to light, neck is supple, trachea is midline, mucous membranes are moist   5. Respiratory : Barrel chest, lungs are clear to auscultation bilaterally without wheezing, rhonchi, rales, no cyanosis, no increase in work of breathing or accessory muscle use   6. Cardiovascular : Heart rate normal, rhythm is irregular, no murmurs, rubs or gallops, no peripheral edema, peripheral pulses palpated   7. Gastrointestinal:  Abdomen is soft, nondistended, nontender to palpation bowel sounds active, no masses or organomegaly palpated   8. Skin:  Skin is warm, dry and intact without rashes, acute lesions, or ulcers on limited exam   9.Musculoskeletal:  No acute deformities or trauma, no asymmetry in tone, no peripheral edema, peripheral pulses palpated, no tenderness to palpation in the extremities  Data Reviewed: In the ED Temp 97.4, heart rate 52-75, respiratory rate 11-19, blood pressure 153/86-195/117 satting at 94% on baseline 2 L nasal cannula No leukocytosis, hemoglobin 16.7, platelets 221 Chemistry is unremarkable Negative COVID and flu UDS shows benzos, THC, opiates Chest x-ray shows cardiomegaly with central pulmonary vascular congestion.  Interstitial opacities in the  left lung base likely due to edema EKG shows a heart rate of 65, A-fib, QTc 4 and 39 Patient was started on nitro, and 2 doses of morphine, and a dose of Zofran Troponin flat at 14 and 15 Cardiology to see in the a.m.  Assessment and Plan: * Chest pain - With history of coronary artery disease - Continue aspirin, statin, ACE, Imdur, Ranexa - Continue Nitropaste - Continue to monitor on telemetry -Troponin normal x 2 - Consult cardiology  CAD (coronary artery disease) - Continue Imdur, Ranexa, metoprolol, ACE, aspirin,  Type 2 diabetes mellitus without complications (HCC) - Holding home oral hypoglycemics - Sliding scale coverage -N.p.o. for possible procedure today  Atrial fibrillation (HCC) - Continue amiodarone and Eliquis  HLD (hyperlipidemia) - Continue Lipitor  GERD (gastroesophageal reflux disease) - Continue PPI      Advance Care Planning:   Code Status: Full Code  Consults: Cardiology  Family Communication: No family at bedside   Severity of Illness: The appropriate patient status for this patient is OBSERVATION. Observation status is judged to be reasonable and necessary in order to provide the required intensity of service to ensure the patient's safety. The patient's presenting symptoms, physical exam findings, and initial radiographic and laboratory data in the context of their medical condition is felt to place them at decreased risk for further clinical deterioration. Furthermore, it is anticipated that the patient will be medically stable for discharge from the hospital within 2 midnights of admission.   Author: Rolla Plate, DO 10/31/2022 6:04 AM  For on call review [email or web address].

## 2022-10-31 NOTE — ED Notes (Signed)
Pt requesting pain meds, pt states the morphine is not helping pain. RN secure chat MD Masoud for different pain medication.

## 2022-10-31 NOTE — Assessment & Plan Note (Signed)
>>  ASSESSMENT AND PLAN FOR TYPE 2 DIABETES MELLITUS WITHOUT COMPLICATIONS WRITTEN ON 10/31/2022  5:50 AM BY ZIERLE-GHOSH, ASIA B, DO  - Holding home oral hypoglycemics - Sliding scale coverage -N.p.o. for possible procedure today

## 2022-10-31 NOTE — Assessment & Plan Note (Signed)
-   With history of coronary artery disease - Continue aspirin, statin, ACE, Imdur, Ranexa - Continue Nitropaste - Continue to monitor on telemetry - Consult cardiology

## 2022-10-31 NOTE — Assessment & Plan Note (Signed)
-   Continue amiodarone and Eliquis

## 2022-10-31 NOTE — Progress Notes (Addendum)
  Progress Note   Patient: Lawrence [name] VCB:[id number] DOB: 1956/10/27 DOA: 10/31/2022     0 DOS: the patient was seen and examined on 10/31/2022   Brief hospital course: 66 yo male with a PMH of HTN, DM2, Chronic Respiratory Failure on 2L [place], CAD s/p CABG x 2, 3846 PCI complicated by dissection, and multiple subsequent stents, and Afib on Amio and Eliquis who was recently seen at [facility] on 08/07/22 for chest pain and sent home on ranexa isosorbide. He returns to ED with recurrent chest pain. EKG is unchanged. Troponins are wnl x 2.    12/16 - Cardiology is planning LHC on Monday.   Assessment and Plan: Unstable Angina in the setting of CAD s/p CABG and multiple stents: - Start Aspirin 81 mg daily.  Hold Eliquis and start Heparin drip (consult pharmacy).  Plan is for [facility] on Monday.  Appreciate assistance and recommendations. - Continue Imdur, Ranexa.  Give Nitro paste and IV Morphine PRN for pain complaints.  CAD (coronary artery disease) - Continue Aspirin, Statin, BB, Nitro PRN, Morphine PRN, Oxygen PRN. - Continue home Imdur and Ranexa.  Atrial Fibrillation: - Continue Amiodarone for rhythm control. - Hold Eliquis for LHC.  Continue heparin drip for anticoagulation.  Type 2 diabetes mellitus without complications (HCC) - Continue sliding scale ACHS.  Hyperlipidemia: - Statin  GERD: - PPI      Subjective: Lawrence [name] endorses persistent chest pain.  He denies any nausea, vomiting, back pain, headaches, sweating, arm pain, jaw pain, or shortness of breath.  Physical Exam: Vitals:   10/31/22 1330 10/31/22 1415 10/31/22 1419 10/31/22 1718  BP: 129/74 108/87  110/89  Pulse: 76 73  (!) 38  Resp: (!) 42 13    Temp:   97.7 F (36.5 C)   TempSrc:      SpO2: 98% 94%  96%  Weight:      Height:       Examination: General exam: chronically ill appearing male lying in bed with persistent chest pain, non-reproducible on exam HEENT: NCAT, PERRL Respiratory system: CTAB no  WRR Cardiovascular system: 1/6 systolic murmur, regular, No JVD. Gastrointestinal system: No flank pain, Abdomen soft, NT,ND, BS+. Nervous System: No focal deficits. Extremities: No edema, distal peripheral pulses palpable.  Skin: No rashes, scattered bruises MSK: Physical Deconditioning  Data Reviewed:  There are no new results to review at this time.  DG Chest Port 1 View CLINICAL DATA:  Chest pain  EXAM: PORTABLE CHEST 1 VIEW  COMPARISON:  Chest x-ray 08/07/2022  FINDINGS: Heart is enlarged, unchanged. There central pulmonary vascular congestion. There some interstitial opacities in the left lung base. There is no lung consolidation, pleural effusion or pneumothorax. Patient is status post cardiac surgery. No fractures are identified.  IMPRESSION: 1. Cardiomegaly with central pulmonary vascular congestion. 2. Interstitial opacities in the left lung base, likely due to edema.  Electronically Signed   By: Ronney Asters M.D.   On: 10/31/2022 01:51    Family Communication: I spoke with patient only.  Disposition: Status is: Observation The patient remains OBS appropriate and will d/c before 2 midnights.  Planned Discharge Destination: Home    Time spent: >30 minutes  Author: George Hugh, MD 10/31/2022 5:59 PM  For on call review [email or web address].

## 2022-10-31 NOTE — Assessment & Plan Note (Signed)
>>  ASSESSMENT AND PLAN FOR GERD (GASTROESOPHAGEAL REFLUX DISEASE) WRITTEN ON 10/31/2022  5:50 AM BY ZIERLE-GHOSH, ASIA B, DO  - Continue PPI

## 2022-10-31 NOTE — Assessment & Plan Note (Signed)
-   Holding home oral hypoglycemics - Sliding scale coverage -N.p.o. for possible procedure today

## 2022-10-31 NOTE — Assessment & Plan Note (Signed)
>>  ASSESSMENT AND PLAN FOR ATRIAL FIBRILLATION WRITTEN ON 10/31/2022  5:51 AM BY ZIERLE-GHOSH, ASIA B, DO  - Continue amiodarone and Eliquis

## 2022-10-31 NOTE — Assessment & Plan Note (Signed)
-  Continue Lipitor °

## 2022-10-31 NOTE — Assessment & Plan Note (Signed)
>>  ASSESSMENT AND PLAN FOR CAD (CORONARY ARTERY DISEASE) WRITTEN ON 10/31/2022  5:51 AM BY ZIERLE-GHOSH, ASIA B, DO  - Continue Imdur, Ranexa, metoprolol, ACE, aspirin,

## 2022-10-31 NOTE — Assessment & Plan Note (Signed)
Continue PPI ?

## 2022-10-31 NOTE — ED Provider Notes (Signed)
[facility] Provider Note    Event Date/Time   First MD Initiated Contact with Patient 10/31/22 0112     (approximate)   History   Chest Pain   HPI  Lawrence [name] is a 66 y.o. male brought to the ED from home via EMS with a chief complaint of chest pain.  Patient with a history of CAD status post stents, CHF, atrial fibrillation on Eliquis, COPD who reports chest pain x 2 days.  Seen by cardiology PA on 10/29/2022.  Reports increase in pain tonight, central, nonradiating, not associated with diaphoresis, shortness of breath, palpitations, nausea/vomiting or dizziness.  Took 4 baby aspirin and nitroglycerin prior to arrival.  Reports recent cough and congestion which he attributes to his COPD.  Denies abdominal pain, dysuria, diarrhea.     Past Medical History   Past Medical History:  Diagnosis Date   A-fib ([place])    Anemia    Anginal pain ([place])    Anxiety    Arthritis    Asthma    CAD (coronary artery disease)    a. 2002 CABGx2 (LIMA->LAD, VG->VG->OM1);  b. 09/2012 DES->OM;  c. 03/2015 PTCA of LAD [facility]) in setting of atretic LIMA; d. 05/2015 Cath [facility]): nonobs dzs; e. 06/2015 Cath ([facility]): LM nl, LAD 45p/d ISR, 50d, D1/2 small, LCX 50p/d ISR, OM1 70ost, 30 ISR, VG->OM1 50ost, 55m LIMA->LAD 99p/d - atretic, RCA dom, nl; f.cath 10/16: 40-50%(FFR 0.90) pLAD, 75% (FFR 0.77) mLAD s/p PCI/DES, oRCA 40% (FFR0.95)   Cancer (HCC)    SKIN CANCER ON BACK   Celiac disease    Chronic diastolic CHF (congestive heart failure) ([place]    a. 06/2009 Echo: EF 60-65%, Gr 1 DD, triv AI, mildly dil LA, nl RV.   COPD (chronic obstructive pulmonary disease) ([place]    a. Chronic bronchitis and emphysema.   DDD (degenerative disc disease), lumbar    Diverticulosis    Dysrhythmia    Essential hypertension    GERD (gastroesophageal reflux disease)    History of hiatal hernia    History of kidney stones    H/O   History of tobacco abuse    a. Quit 2014.   Myocardial infarction  ([facility] 2002   4 STENTS   Pancreatitis    PSVT (paroxysmal supraventricular tachycardia)    a. 10/2012 Noted on Zio Patch.   Sleep apnea    LOST CORD TO CPAP -ONLY 02 @ BEDTIME   Tubular adenoma of colon    Type II diabetes mellitus ([place]      Active Problem List   Patient Active Problem List   Diagnosis Date Noted   Atrial fibrillation, chronic ([place] 08/07/2022   Type 2 diabetes mellitus without complications ([place] 047/65/4650  Hx of CABG    Atrial fibrillation with RVR ([place] 06/28/2022   Essential hypertension 06/28/2022   Dyslipidemia 06/28/2022   Hypokalemia 06/28/2022   Acute bronchitis 06/28/2022   Chronic diastolic CHF (congestive heart failure) ([place]    Atherosclerosis of aorta ([place] 12/16/2020   Syncope 12/11/2020   Polycythemia 10/05/2020   Primary insomnia 05/13/2020   Recurrent major depressive disorder, remission status unspecified ([place] 03/29/2020   Chronic respiratory failure with hypoxia ([place] 05/29/2019   Trigger thumb of right hand 11/28/2018   History of adenomatous polyp of colon 04/05/2018   CKD (chronic kidney disease) stage 2, GFR 60-89 ml/min 04/03/2016   Anemia 04/03/2016   Atrial fibrillation ([place] 12/23/2015   OSA (obstructive sleep apnea) 12/10/2015  Left inguinal hernia 11/07/2015   Anxiety 11/07/2015   Back pain with left-sided radiculopathy 09/30/2015   Nocturnal hypoxia 09/06/2015   BPH (benign prostatic hyperplasia) 08/01/2015   Acute on chronic systolic CHF (congestive heart failure) (HCC)    Anginal pain, suspect stable angina (HCC)    Chest pain 07/11/2015   Chronic obstructive pulmonary disease (COPD) ([place]) 07/03/2015   COPD exacerbation ([place]) 06/26/2015   CAD (coronary artery disease) 06/26/2015   Primary hypertension 06/26/2015   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[place]) 06/26/2015   Achalasia 07/24/2014   GERD (gastroesophageal reflux disease) 06/07/2014   Former tobacco use 04/11/2013   HLD (hyperlipidemia)  04/09/2013     Past Surgical History   Past Surgical History:  Procedure Laterality Date   BYPASS GRAFT     CARDIAC CATHETERIZATION N/A 07/12/2015   rocedure: Left Heart Cath and Cors/Grafts Angiography;  Surgeon: Belva Crome, MD;  Location: [place] CV LAB;  Service: Cardiovascular;  Laterality: N/A;   CARDIAC CATHETERIZATION Right 10/07/2015   Procedure: Left Heart Cath and Cors/Grafts Angiography;  Surgeon: Dionisio David, MD;  Location: [place] CV LAB;  Service: Cardiovascular;  Laterality: Right;   CARDIAC CATHETERIZATION N/A 04/06/2016   Procedure: Left Heart Cath and Coronary Angiography;  Surgeon: Yolonda Kida, MD;  Location: [place] CV LAB;  Service: Cardiovascular;  Laterality: N/A;   CARDIAC CATHETERIZATION  04/06/2016   Procedure: Bypass Graft Angiography;  Surgeon: Yolonda Kida, MD;  Location: [place] [facility];  Service: Cardiovascular;;   CARDIAC CATHETERIZATION N/A 11/02/2016   Procedure: Left Heart Cath and Cors/Grafts Angiography and possible PCI;  Surgeon: Yolonda Kida, MD;  Location: [place] CV LAB;  Service: Cardiovascular;  Laterality: N/A;   CARDIAC CATHETERIZATION N/A 11/02/2016   Procedure: Coronary Stent Intervention;  Surgeon: Yolonda Kida, MD;  Location: [place] CV LAB;  Service: Cardiovascular;  Laterality: N/A;   CARDIOVERSION N/A 06/29/2022   Procedure: CARDIOVERSION;  Surgeon: Corey Skains, MD;  Location: [facility] ORS;  Service: Cardiovascular;  Laterality: N/A;   CHOLECYSTECTOMY     CIRCUMCISION N/A 06/09/2019   Procedure: CIRCUMCISION ADULT;  Surgeon: Billey Co, MD;  Location: [facility] ORS;  Service: Urology;  Laterality: N/A;   COLONOSCOPY WITH PROPOFOL N/A 04/01/2018   Procedure: COLONOSCOPY WITH PROPOFOL;  Surgeon: Manya Silvas, MD;  Location: [facility] ENDOSCOPY;  Service: Endoscopy;  Laterality: N/A;   ESOPHAGEAL DILATION     ESOPHAGOGASTRODUODENOSCOPY (EGD) WITH PROPOFOL N/A 04/01/2018   Procedure:  ESOPHAGOGASTRODUODENOSCOPY (EGD) WITH PROPOFOL;  Surgeon: Manya Silvas, MD;  [facility] ENDOSCOPY;  Service: Endoscopy;  Laterality: N/A;   LEFT HEART CATH AND CORS/GRAFTS ANGIOGRAPHY N/A 06/12/2019   Procedure: LEFT HEART CATH AND CORS/GRAFTS ANGIOGRAPHY;  Surgeon: Teodoro Spray, MD;  Location: [place] CV LAB;  Service: Cardiovascular;  Laterality: N/A;   LEFT HEART CATH AND CORS/GRAFTS ANGIOGRAPHY N/A 03/11/2020   Procedure: LEFT HEART CATH AND CORS/GRAFTS ANGIOGRAPHY;  Surgeon: Isaias Cowman, MD;  Location: [place] CV LAB;  Service: Cardiovascular;  Laterality: N/A;   LEFT HEART CATH AND CORS/GRAFTS ANGIOGRAPHY N/A 05/01/2021   Procedure: LEFT HEART CATH AND CORS/GRAFTS ANGIOGRAPHY;  Surgeon: Corey Skains, MD;  Location: [place] CV LAB;  Service: Cardiovascular;  Laterality: N/A;   TONSILLECTOMY     VASCULAR SURGERY       Home Medications   Prior to Admission medications   Medication Sig Start Date End Date Taking? Authorizing Provider  Accu-Chek Softclix Lancets lancets Use  as instructed to check sugar daily for type 2 diabetes. 03/03/21   Birdie Sons, MD  acetaminophen (TYLENOL) 325 MG tablet Take 2 tablets (650 mg total) by mouth every 4 (four) hours as needed for headache or mild pain. 08/07/22   Lavina Hamman, MD  albuterol (VENTOLIN HFA) 108 (90 Base) MCG/ACT inhaler INHALE 2 PUFFS BY MOUTH EVERY 6 HOURS AS NEEDED FOR SHORTNESS OF BREATH 12/02/21   Birdie Sons, MD  allopurinol (ZYLOPRIM) 300 MG tablet TAKE 1 TABLET BY MOUTH TWICE A DAY 10/15/21   Birdie Sons, MD  ALPRAZolam Duanne Moron) 1 MG tablet Take 1 tablet (1 mg total) by mouth 3 (three) times daily. 09/11/22   Birdie Sons, MD  amiodarone (PACERONE) 200 MG tablet Take 200 mg by mouth 2 (two) times daily. 07/06/22   [provider]  aspirin 81 MG chewable tablet Chew 81 mg by mouth daily. Swallows whole    [provider]  atorvastatin (LIPITOR) 80 MG tablet  TAKE 1 TABLET BY MOUTH AT BEDTIME 04/16/22   Birdie Sons, MD  BELSOMRA 5 MG TABS TAKE 1 TABLET (5 MG TOTAL) BY MOUTH AT BEDTIME AS NEEDED 06/17/22   Birdie Sons, MD  Blood Glucose Calibration (ACCU-CHEK GUIDE CONTROL) LIQD Use with blood glucose monitor as directed 02/20/21   Birdie Sons, MD  Blood Glucose Monitoring Suppl (ACCU-CHEK GUIDE) w/Device KIT Use to check blood sugars as directed 02/20/21   Birdie Sons, MD  BREO ELLIPTA 100-25 MCG/ACT AEPB INHALE 1 PUFF BY MOUTH INTO THE LUNGS DAILY. 08/31/22   Birdie Sons, MD  celecoxib (CELEBREX) 200 MG capsule Take 1 capsule by mouth 2 (two) times daily. 11/03/21   [provider]  cetirizine (ZYRTEC) 10 MG tablet TAKE 1 TABLET BY MOUTH AT BEDTIME 04/16/22   Birdie Sons, MD  diltiazem (CARDIZEM CD) 180 MG 24 hr capsule Take 1 capsule (180 mg total) by mouth daily. 07/09/22   Lorella Nimrod, MD  docusate sodium (COLACE) 100 MG capsule Take 100 mg by mouth daily as needed for mild constipation. Patient not taking: Reported on 09/10/2022    [provider]  ELIQUIS 5 MG TABS tablet TAKE 1 TABLET BY MOUTH TWICE A DAY 07/30/22   Birdie Sons, MD  FARXIGA 10 MG TABS tablet TAKE 1 TABLET BY MOUTH DAILY 02/09/22   Birdie Sons, MD  glucose blood (ACCU-CHEK GUIDE) test strip Use as instructed to check sugar daily for type 2 diabetes. 03/03/21   Birdie Sons, MD  isosorbide mononitrate (IMDUR) 60 MG 24 hr tablet TAKE 3 TABLETS BY MOUTH EVERY DAY. 10/20/22   Birdie Sons, MD  linaclotide (LINZESS) 72 MCG capsule Take 72 mcg by mouth daily before breakfast.    [provider]  lisinopril (ZESTRIL) 10 MG tablet TAKE 1 TABLET BY MOUTH DAILY 07/30/22   Birdie Sons, MD  meclizine (ANTIVERT) 25 MG tablet TAKE ONE TABLET BY THREE TIMES A DAY AS NEEDED FOR DIZZINESS 08/13/22   Birdie Sons, MD  metFORMIN (GLUCOPHAGE-XR) 500 MG 24 hr tablet TAKE 1 TABLET BY MOUTH TWICE A DAY 10/05/22   Birdie Sons, MD   methocarbamol (ROBAXIN) 500 MG tablet Take 500 mg by mouth every 8 (eight) hours as needed for muscle spasms. Take 1 tablet by mouth every hour as needed for muscle spasms    [name], [name], MD  metoprolol tartrate (LOPRESSOR) 25 MG tablet Take 1 tablet (25 mg  total) by mouth 2 (two) times daily. 08/07/22   Lavina Hamman, MD  naloxone [facility]) nasal spray 4 mg/0.1 mL 1 spray into nostril x1 and may repeat every 2-3 minutes until patient is responsive or EMS arrives 08/21/20   Birdie Sons, MD  nitroGLYCERIN (NITROSTAT) 0.4 MG SL tablet Place 1 tablet (0.4 mg total) under the tongue every 5 (five) minutes as needed for chest pain. 09/10/22   Mikey Kirschner, PA-C  omega-3 acid ethyl esters (LOVAZA) 1 g capsule Take 4 capsules (4 g total) by mouth daily. 09/10/22   Mikey Kirschner, PA-C  omeprazole (PRILOSEC) 40 MG capsule TAKE 1 CAPSULE BY MOUTH 2 TIMES DAILY 30 MINUTES BEFORE BREAKFAST AND DINNER 07/15/22   Birdie Sons, MD  ondansetron (ZOFRAN-ODT) 4 MG disintegrating tablet Take 1 tablet (4 mg total) by mouth every 8 (eight) hours as needed for nausea or vomiting. Patient not taking: Reported on 09/10/2022 04/08/22   Birdie Sons, MD  oxyCODONE-acetaminophen (PERCOCET) 10-325 MG tablet TAKE 1 TABLET BY MOUTH EVERY 4 HOURS AS NEEDED FOR PAIN. 10/10/22   Birdie Sons, MD  ranolazine (RANEXA) 1000 MG SR tablet Take 1 tablet (1,000 mg total) by mouth 2 (two) times daily. 04/02/21   Birdie Sons, MD  SPIRIVA HANDIHALER 18 MCG inhalation capsule PLACE 1 CAPSULE INTO INHALER AND INHALE BY MOUTH ONCE A DAY. 06/02/22   Birdie Sons, MD  torsemide (DEMADEX) 100 MG tablet Take 0.5 tablets (50 mg total) by mouth daily. 09/10/22   Mikey Kirschner, PA-C  traZODone (DESYREL) 150 MG tablet TAKE 1 TABLET BY MOUTH AT BEDTIME 09/01/21   Birdie Sons, MD  venlafaxine XR (EFFEXOR-XR) 75 MG 24 hr capsule TAKE 1 CAPSULE BY MOUTH DAILY WITH BREAKFAST 12/16/21   Birdie Sons, MD      Allergies  Prednisone, Demerol  [meperidine hcl], Demerol [meperidine], Jardiance [empagliflozin], Sulfa antibiotics, Albuterol sulfate [albuterol], and Morphine sulfate   Family History   Family History  Problem Relation Age of Onset   Heart attack Mother    Depression Mother    Heart disease Mother    COPD Mother    Hypertension Mother    Heart attack Father    Diabetes Father    Depression Father    Heart disease Father    Cirrhosis Father    Parkinson's disease Brother      Physical Exam  Triage Vital Signs: ED Triage Vitals  Enc Vitals Group     BP      Pulse      Resp      Temp      Temp src      SpO2      Weight      Height      Head Circumference      Peak Flow      Pain Score      Pain Loc      Pain Edu?      Excl. in [place]?     Updated Vital Signs: BP (!) 165/89   Pulse 61   Temp (!) 97.4 F (36.3 C) (Oral)   Resp 19   Ht _0  (1.702 m)   Wt 91.2 kg   SpO2 95%   BMI 31.48 kg/m    General: Awake, no distress.  CV:  Regular rate, irregular rhythm.  Good peripheral perfusion.  Resp:  Normal effort.  CTAB. Abd:  Nontender.  No distention.  Other:  Supple calves.  ED Results / Procedures / Treatments  Labs (all labs ordered are listed, but only abnormal results are displayed) Labs Reviewed  CBC WITH DIFFERENTIAL/PLATELET - Abnormal; Notable for the following components:      Result Value   HCT 52.6 (*)    All other components within normal limits  COMPREHENSIVE METABOLIC PANEL - Abnormal; Notable for the following components:   Glucose, Bld 145 (*)    All other components within normal limits  LIPASE, BLOOD - Abnormal; Notable for the following components:   Lipase 88 (*)    All other components within normal limits  URINE DRUG SCREEN, QUALITATIVE ([facility] ONLY) - Abnormal; Notable for the following components:   Opiate, Ur Screen POSITIVE (*)    Cannabinoid 50 Ng, Ur [place] POSITIVE (*)    Benzodiazepine, Ur Scrn POSITIVE (*)     All other components within normal limits  BRAIN NATRIURETIC PEPTIDE - Abnormal; Notable for the following components:   B Natriuretic Peptide 301.7 (*)    All other components within normal limits  RESP PANEL BY RT-PCR (RSV, FLU A&B, COVID)  RVPGX2  TROPONIN I (HIGH SENSITIVITY)  TROPONIN I (HIGH SENSITIVITY)     EKG  ED ECG REPORT I, Tayton Decaire J, the attending physician, personally viewed and interpreted this ECG.   Date: 10/31/2022  EKG Time: 0117  Rate: 65  Rhythm: atrial fibrillation, rate 65  Axis: Normal  Intervals:none  ST&T Change: Nonspecific    RADIOLOGY I have independently visualized and interpreted patient's chest x-ray as well as noted the radiology interpretation:  X-ray: Cardiomegaly, central pulmonary vascular congestion  Official radiology report(s): DG Chest Port 1 View  Result Date: 10/31/2022 CLINICAL DATA:  Chest pain EXAM: PORTABLE CHEST 1 VIEW COMPARISON:  Chest x-ray 08/07/2022 FINDINGS: Heart is enlarged, unchanged. There central pulmonary vascular congestion. There some interstitial opacities in the left lung base. There is no lung consolidation, pleural effusion or pneumothorax. Patient is status post cardiac surgery. No fractures are identified. IMPRESSION: 1. Cardiomegaly with central pulmonary vascular congestion. 2. Interstitial opacities in the left lung base, likely due to edema. Electronically Signed   By: Ronney Asters M.D.   On: 10/31/2022 01:51     PROCEDURES:  Critical Care performed: Yes, see critical care procedure note(s)  CRITICAL CARE Performed by: Paulette Blanch   Total critical care time: 45 minutes  Critical care time was exclusive of separately billable procedures and treating other patients.  Critical care was necessary to treat or prevent imminent or life-threatening deterioration.  Critical care was time spent personally by me on the following activities: development of treatment plan with patient and/or surrogate as  well as nursing, discussions with consultants, evaluation of patient's response to treatment, examination of patient, obtaining history from patient or surrogate, ordering and performing treatments and interventions, ordering and review of laboratory studies, ordering and review of radiographic studies, pulse oximetry and re-evaluation of patient's condition.   Marland Kitchen1-3 Lead EKG Interpretation  Performed by: Paulette Blanch, MD Authorized by: Paulette Blanch, MD     Interpretation: abnormal     ECG rate:  65   ECG rate assessment: normal     Rhythm: atrial fibrillation     Ectopy: none     Conduction: normal   Comments:     Patient placed on cardiac monitor to evaluate for arrhythmias    MEDICATIONS ORDERED IN ED: Medications  ondansetron (ZOFRAN) injection 4 mg (4 mg Intravenous Given 10/31/22 0124)  morphine (PF) 4 MG/ML injection  4 mg (4 mg Intravenous Given 10/31/22 0126)  morphine (PF) 4 MG/ML injection 4 mg (4 mg Intravenous Given 10/31/22 0239)     IMPRESSION / MDM / ASSESSMENT AND PLAN / ED COURSE  I reviewed the triage vital signs and the nursing notes.                             66 year old male presenting with chest pain. Differential diagnosis includes, but is not limited to, ACS, aortic dissection, pulmonary embolism, cardiac tamponade, pneumothorax, pneumonia, pericarditis, myocarditis, GI-related causes including esophagitis/gastritis, and musculoskeletal chest wall pain.   I have personally reviewed patient's records and note his cardiology office visit from 10/29/2022.  Patient's presentation is most consistent with acute presentation with potential threat to life or bodily function.  The patient is on the cardiac monitor to evaluate for evidence of arrhythmia and/or significant heart rate changes.  Will obtain cardiac panel, chest x-ray.  Patient reports no relief from aspirin and nitroglycerin taken at home.  In the past IV narcotics has helped his pain, in particular IV  Morphine.  Will reassess.  Clinical Course as of 10/31/22 0503  Sat Oct 31, 2022  0234 Updated patient and of all test results demonstrating normal WBC 9.2, normal electrolytes, minimally elevated lipase at 88.  Initial troponin is negative.  Respiratory panel negative.  UDS positive for cannabinoids and benzodiazepines; patient received opioids in the ED.  Patient asking for another dose of morphine.  Will repeat troponin. [JS]  0501 Updated patient of negative repeat troponin. States his cardiologist is trying to schedule him for cardiac cath. Spoke with Dr. Clayborn Bigness his cardiologist who agrees with admission. Will consult hospitalist services for evaluation and admission. [JS]    Clinical Course User Index [JS] Paulette Blanch, MD     FINAL CLINICAL IMPRESSION(S) / ED DIAGNOSES   Final diagnoses:  Unstable angina (HCC)  Chest pain, unspecified type  Hypertension, unspecified type     Rx / DC Orders   ED Discharge Orders     None        Note:  This document was prepared using Dragon voice recognition software and may include unintentional dictation errors.   Paulette Blanch, MD 10/31/22 [phone]

## 2022-10-31 NOTE — ED Notes (Signed)
Pt sleeping. Placed on his home 2 L nasal cannula he uses when he sleeps as he no longer has access to this Cpap.

## 2022-10-31 NOTE — ED Notes (Signed)
XR at bedside

## 2022-10-31 NOTE — Consult Note (Signed)
CARDIOLOGY CONSULT NOTE               Patient ID: Lawrence [name] MRN: [id number] DOB/AGE: [birth year] 66 y.o.  Admit date: 10/31/2022 Referring Physician Dr Clearence Ped, [facility] Primary Physician Dr [name] primary Primary Cardiologist [facility] Reason for Consultation Chest Pain CAD  HPI: Pt is a 66 y/o WM with multiple medical problems chronic angina CP COPD HTN DM OSA former smoker presents with chronic recurrent chest pain and angina.He has had multiple ER visits and admissions for cp and angina. He was seen by cardiology earlier this week with similar symptom and had his meds increased. His last cath was about 18 months ago 6/22. He was treated medically at that time. He is on maximal medical therapy. He has had negative enzymes but c/o left sided CP. Symptom occur at rest and exertion. No acute EKG find  Review of systems complete and found to be negative unless listed above     Past Medical History:  Diagnosis Date   A-fib ([place])    Anemia    Anginal pain (HCC)    Anxiety    Arthritis    Asthma    CAD (coronary artery disease)    a. 2002 CABGx2 (LIMA->LAD, VG->VG->OM1);  b. 09/2012 DES->OM;  c. 03/2015 PTCA of LAD [facility]) in setting of atretic LIMA; d. 05/2015 Cath [facility]): nonobs dzs; e. 06/2015 Cath ([facility]): LM nl, LAD 45p/d ISR, 50d, D1/2 small, LCX 50p/d ISR, OM1 70ost, 30 ISR, VG->OM1 50ost, 10m LIMA->LAD 99p/d - atretic, RCA dom, nl; f.cath 10/16: 40-50%(FFR 0.90) pLAD, 75% (FFR 0.77) mLAD s/p PCI/DES, oRCA 40% (FFR0.95)   Cancer (HCC)    SKIN CANCER ON BACK   Celiac disease    Chronic diastolic CHF (congestive heart failure) ([place]    a. 06/2009 Echo: EF 60-65%, Gr 1 DD, triv AI, mildly dil LA, nl RV.   COPD (chronic obstructive pulmonary disease) ([place]    a. Chronic bronchitis and emphysema.   DDD (degenerative disc disease), lumbar    Diverticulosis    Dysrhythmia    Essential hypertension    GERD (gastroesophageal reflux disease)    History of  hiatal hernia    History of kidney stones    H/O   History of tobacco abuse    a. Quit 2014.   Myocardial infarction ([facility] 2002   4 STENTS   Pancreatitis    PSVT (paroxysmal supraventricular tachycardia)    a. 10/2012 Noted on Zio Patch.   Sleep apnea    LOST CORD TO CPAP -ONLY 02 @ BEDTIME   Tubular adenoma of colon    Type II diabetes mellitus ([place]     Past Surgical History:  Procedure Laterality Date   BYPASS GRAFT     CARDIAC CATHETERIZATION N/A 07/12/2015   rocedure: Left Heart Cath and Cors/Grafts Angiography;  Surgeon: HBelva Crome MD;  [place];  Service: Cardiovascular;  Laterality: N/A;   CARDIAC CATHETERIZATION Right 10/07/2015   Procedure: Left Heart Cath and Cors/Grafts Angiography;  Surgeon: SDionisio David MD;  [place];  Service: Cardiovascular;  Laterality: Right;   CARDIAC CATHETERIZATION N/A 04/06/2016   Procedure: Left Heart Cath and Coronary Angiography;  Surgeon: DYolonda Kida MD;  [place];  Service: Cardiovascular;  Laterality: N/A;   CARDIAC CATHETERIZATION  04/06/2016   Procedure: Bypass Graft Angiography;  Surgeon: DYolonda Kida MD;  [place] [facility];  Service: Cardiovascular;;  CARDIAC CATHETERIZATION N/A 11/02/2016   Procedure: Left Heart Cath and Cors/Grafts Angiography and possible PCI;  Surgeon: Yolonda Kida, MD;  Location: [place] CV LAB;  Service: Cardiovascular;  Laterality: N/A;   CARDIAC CATHETERIZATION N/A 11/02/2016   Procedure: Coronary Stent Intervention;  Surgeon: Yolonda Kida, MD;  Location: [place] CV LAB;  Service: Cardiovascular;  Laterality: N/A;   CARDIOVERSION N/A 06/29/2022   Procedure: CARDIOVERSION;  Surgeon: Corey Skains, MD;  Location: [facility] ORS;  Service: Cardiovascular;  Laterality: N/A;   CHOLECYSTECTOMY     CIRCUMCISION N/A 06/09/2019   Procedure: CIRCUMCISION ADULT;  Surgeon: Billey Co, MD;  Location: [facility]  ORS;  Service: Urology;  Laterality: N/A;   COLONOSCOPY WITH PROPOFOL N/A 04/01/2018   Procedure: COLONOSCOPY WITH PROPOFOL;  Surgeon: Manya Silvas, MD;  Location: [facility] ENDOSCOPY;  Service: Endoscopy;  Laterality: N/A;   ESOPHAGEAL DILATION     ESOPHAGOGASTRODUODENOSCOPY (EGD) WITH PROPOFOL N/A 04/01/2018   Procedure: ESOPHAGOGASTRODUODENOSCOPY (EGD) WITH PROPOFOL;  Surgeon: Manya Silvas, MD;  Location: [facility] ENDOSCOPY;  Service: Endoscopy;  Laterality: N/A;   LEFT HEART CATH AND CORS/GRAFTS ANGIOGRAPHY N/A 06/12/2019   Procedure: LEFT HEART CATH AND CORS/GRAFTS ANGIOGRAPHY;  Surgeon: Teodoro Spray, MD;  Location: [place] CV LAB;  Service: Cardiovascular;  Laterality: N/A;   LEFT HEART CATH AND CORS/GRAFTS ANGIOGRAPHY N/A 03/11/2020   Procedure: LEFT HEART CATH AND CORS/GRAFTS ANGIOGRAPHY;  Surgeon: Isaias Cowman, MD;  Location: [place] CV LAB;  Service: Cardiovascular;  Laterality: N/A;   LEFT HEART CATH AND CORS/GRAFTS ANGIOGRAPHY N/A 05/01/2021   Procedure: LEFT HEART CATH AND CORS/GRAFTS ANGIOGRAPHY;  Surgeon: Corey Skains, MD;  Location: [place] CV LAB;  Service: Cardiovascular;  Laterality: N/A;   TONSILLECTOMY     VASCULAR SURGERY      (Not in a hospital admission)  Social History   Socioeconomic History   Marital status: Widowed    Spouse name: Not on file   Number of children: 1   Years of education: Not on file   Highest education level: 10th grade  Occupational History   Occupation: Disabled   Occupation: on [employer] farm manager  Tobacco Use   Smoking status: Former    Packs/day: 3.00    Years: 50.00    Total pack years: 150.00    Types: Cigarettes    Quit date: 04/22/2013    Years since quitting: 9.5   Smokeless tobacco: Never   Tobacco comments:    Reports not smoking for approx 8 years.  Vaping Use   Vaping Use: Never used  Substance and Sexual Activity   Alcohol use: No    Comment: remotely quit alcohol use. Hx of heavy alcohol  use.   Drug use: Yes    Types: Marijuana    Comment: occasionally   Sexual activity: Not on file  Other Topics Concern   Not on file  Social History Narrative   Pt lives in [place] with wife.  Does not routinely exercise.   Social Determinants of Health   Financial Resource Strain: Low Risk  (01/08/2022)   Overall Financial Resource Strain (CARDIA)    Difficulty of Paying Living Expenses: Not hard at all  Food Insecurity: No Food Insecurity (01/08/2022)   Hunger Vital Sign    Worried About Running Out of Food in the Last Year: Never true    Ran Out of Food in the Last Year: Never true  Transportation Needs: No Transportation Needs (01/08/2022)   PRAPARE - Transportation  Lack of Transportation (Medical): No    Lack of Transportation (Non-Medical): No  Physical Activity: Insufficiently Active (01/08/2022)   Exercise Vital Sign    Days of Exercise per Week: 3 days    Minutes of Exercise per Session: 20 min  Stress: No Stress Concern Present (01/08/2022)   [place]    Feeling of Stress : Not at all  Social Connections: Moderately Isolated (01/08/2022)   Social Connection and Isolation Panel [NHANES]    Frequency of Communication with Friends and Family: More than three times a week    Frequency of Social Gatherings with Friends and Family: More than three times a week    Attends Religious Services: More than 4 times per year    Active Member of [employer] or Organizations: No    Attends Archivist Meetings: Never    Marital Status: Widowed  Intimate Partner Violence: Not At Risk (01/08/2022)   Humiliation, Afraid, Rape, and Kick questionnaire    Fear of Current or Ex-Partner: No    Emotionally Abused: No    Physically Abused: No    Sexually Abused: No    Family History  Problem Relation Age of Onset   Heart attack Mother    Depression Mother    Heart disease Mother    COPD Mother    Hypertension  Mother    Heart attack Father    Diabetes Father    Depression Father    Heart disease Father    Cirrhosis Father    Parkinson's disease Brother       Review of systems complete and found to be negative unless listed above      PHYSICAL EXAM  General: Well developed, well nourished, in no acute distress HEENT:  Normocephalic and atramatic Neck:  No JVD.  Lungs: Clear bilaterally to auscultation and percussion. Heart: HRRR . Normal S1 and S2 without gallops or murmurs.  Abdomen: Bowel sounds are positive, abdomen soft and non-tender  Msk:  Back normal, normal gait. Normal strength and tone for age. Extremities: No clubbing, cyanosis or edema.   Neuro: Alert and oriented X 3. Psych:  Good affect, responds appropriately  Labs:   Lab Results  Component Value Date   WBC 9.2 10/31/2022   HGB 16.7 10/31/2022   HCT 52.6 (H) 10/31/2022   MCV 91.8 10/31/2022   PLT 221 10/31/2022    Recent Labs  Lab 10/31/22 0116  NA 141  K 4.0  CL 104  CO2 29  BUN 17  CREATININE 0.87  CALCIUM 9.1  PROT 7.3  BILITOT 0.5  ALKPHOS 80  ALT 13  AST 19  GLUCOSE 145*   Lab Results  Component Value Date   CKTOTAL 93 05/23/2014   CKMB 1.7 05/23/2014   TROPONINI <0.03 12/15/2018    Lab Results  Component Value Date   CHOL 117 12/23/2021   CHOL 146 04/27/2021   CHOL 110 12/12/2020   Lab Results  Component Value Date   HDL 29 (L) 12/23/2021   HDL 32 (L) 04/27/2021   HDL 23 (L) 12/12/2020   Lab Results  Component Value Date   LDLCALC 63 12/23/2021   LDLCALC 35 04/27/2021   LDLCALC 42 12/12/2020   Lab Results  Component Value Date   TRIG 144 12/23/2021   TRIG 397 (H) 04/27/2021   TRIG 224 (H) 12/12/2020   Lab Results  Component Value Date   CHOLHDL 4.0 12/23/2021   CHOLHDL 4.6 04/27/2021  CHOLHDL 4.8 12/12/2020   Lab Results  Component Value Date   LDLDIRECT 122.1 (H) 09/27/2020   LDLDIRECT 84.1 03/08/2020   LDLDIRECT 58 05/29/2019      Radiology: DG Chest  Port 1 View  Result Date: 10/31/2022 CLINICAL DATA:  Chest pain EXAM: PORTABLE CHEST 1 VIEW COMPARISON:  Chest x-ray 08/07/2022 FINDINGS: Heart is enlarged, unchanged. There central pulmonary vascular congestion. There some interstitial opacities in the left lung base. There is no lung consolidation, pleural effusion or pneumothorax. Patient is status post cardiac surgery. No fractures are identified. IMPRESSION: 1. Cardiomegaly with central pulmonary vascular congestion. 2. Interstitial opacities in the left lung base, likely due to edema. Electronically Signed   By: Ronney Asters M.D.   On: 10/31/2022 01:51    EKG: NSR 80 nsstw  ASSESSMENT AND PLAN:  Unstable Angina  CAD CABG COPD GERD HTN DM OSA AFIB Former Smoker  . Plan Agree with admit R/OMI f/u EKGs and troponins Heparin IV for anticougulation Antianginal meds continue Imdur/Ranexa/Metoprolol/ASA/Diltiazem/Lipitor Continue GERD therapy with protonix Agree with inhalers for sob and COPD HTN management with metoprolol/dilt/lisinpril/Imdur Insulin therapy for DM Continue lipitor for hyperlipidemia Continue amiodarone/eliquis/metoprolol/diltiazem for AIB Consider cardiac cath Monday , with hold Eliquis     Signed: Yolonda Kida MD, 10/31/2022, 11:45 AM

## 2022-10-31 NOTE — Assessment & Plan Note (Signed)
-   Continue Imdur, Ranexa, metoprolol, ACE, aspirin,

## 2022-10-31 NOTE — ED Notes (Signed)
Pt called out and is asking for his pain medicine. Pt asking for morphine. See [facility]

## 2022-10-31 NOTE — Progress Notes (Signed)
   10/31/22 1718  Vitals  BP 110/89  MAP (mmHg) 98  BP Location Right Arm  BP Method Automatic  Patient Position (if appropriate) Lying  Pulse Rate (!) 18  Pulse Rate Source Monitor  MEWS COLOR  MEWS Score Color Yellow  Oxygen Therapy  SpO2 96 %  O2 Device Nasal Cannula  O2 Flow Rate (L/min) 2 L/min  MEWS Score  MEWS Temp 0  MEWS Systolic 0  MEWS Pulse 2  MEWS RR 1  MEWS LOC 0  MEWS Score 3   New adm from ED. C/o Chest pain. Chest pain is present at all times.Morphine given

## 2022-11-01 DIAGNOSIS — Z951 Presence of aortocoronary bypass graft: Secondary | ICD-10-CM | POA: Diagnosis not present

## 2022-11-01 DIAGNOSIS — J9610 Chronic respiratory failure, unspecified whether with hypoxia or hypercapnia: Secondary | ICD-10-CM | POA: Diagnosis present

## 2022-11-01 DIAGNOSIS — Z87891 Personal history of nicotine dependence: Secondary | ICD-10-CM | POA: Diagnosis not present

## 2022-11-01 DIAGNOSIS — Z79899 Other long term (current) drug therapy: Secondary | ICD-10-CM | POA: Diagnosis not present

## 2022-11-01 DIAGNOSIS — R0789 Other chest pain: Secondary | ICD-10-CM | POA: Diagnosis not present

## 2022-11-01 DIAGNOSIS — I25110 Atherosclerotic heart disease of native coronary artery with unstable angina pectoris: Secondary | ICD-10-CM | POA: Diagnosis present

## 2022-11-01 DIAGNOSIS — Z85828 Personal history of other malignant neoplasm of skin: Secondary | ICD-10-CM | POA: Diagnosis not present

## 2022-11-01 DIAGNOSIS — J449 Chronic obstructive pulmonary disease, unspecified: Secondary | ICD-10-CM | POA: Diagnosis present

## 2022-11-01 DIAGNOSIS — K219 Gastro-esophageal reflux disease without esophagitis: Secondary | ICD-10-CM | POA: Diagnosis present

## 2022-11-01 DIAGNOSIS — I959 Hypotension, unspecified: Secondary | ICD-10-CM | POA: Diagnosis present

## 2022-11-01 DIAGNOSIS — Z79891 Long term (current) use of opiate analgesic: Secondary | ICD-10-CM | POA: Diagnosis not present

## 2022-11-01 DIAGNOSIS — I4819 Other persistent atrial fibrillation: Secondary | ICD-10-CM | POA: Diagnosis present

## 2022-11-01 DIAGNOSIS — I110 Hypertensive heart disease with heart failure: Secondary | ICD-10-CM | POA: Diagnosis present

## 2022-11-01 DIAGNOSIS — Z955 Presence of coronary angioplasty implant and graft: Secondary | ICD-10-CM | POA: Diagnosis not present

## 2022-11-01 DIAGNOSIS — I200 Unstable angina: Secondary | ICD-10-CM | POA: Diagnosis present

## 2022-11-01 DIAGNOSIS — E785 Hyperlipidemia, unspecified: Secondary | ICD-10-CM | POA: Diagnosis present

## 2022-11-01 DIAGNOSIS — F419 Anxiety disorder, unspecified: Secondary | ICD-10-CM | POA: Diagnosis present

## 2022-11-01 DIAGNOSIS — I2510 Atherosclerotic heart disease of native coronary artery without angina pectoris: Secondary | ICD-10-CM | POA: Diagnosis not present

## 2022-11-01 DIAGNOSIS — Z1152 Encounter for screening for COVID-19: Secondary | ICD-10-CM | POA: Diagnosis not present

## 2022-11-01 DIAGNOSIS — R079 Chest pain, unspecified: Secondary | ICD-10-CM | POA: Diagnosis not present

## 2022-11-01 DIAGNOSIS — R0609 Other forms of dyspnea: Secondary | ICD-10-CM | POA: Diagnosis not present

## 2022-11-01 DIAGNOSIS — J439 Emphysema, unspecified: Secondary | ICD-10-CM | POA: Diagnosis present

## 2022-11-01 DIAGNOSIS — E669 Obesity, unspecified: Secondary | ICD-10-CM | POA: Diagnosis present

## 2022-11-01 DIAGNOSIS — E1165 Type 2 diabetes mellitus with hyperglycemia: Secondary | ICD-10-CM | POA: Diagnosis present

## 2022-11-01 DIAGNOSIS — Z7901 Long term (current) use of anticoagulants: Secondary | ICD-10-CM | POA: Diagnosis not present

## 2022-11-01 DIAGNOSIS — G894 Chronic pain syndrome: Secondary | ICD-10-CM | POA: Diagnosis present

## 2022-11-01 DIAGNOSIS — G4733 Obstructive sleep apnea (adult) (pediatric): Secondary | ICD-10-CM | POA: Diagnosis present

## 2022-11-01 DIAGNOSIS — R001 Bradycardia, unspecified: Secondary | ICD-10-CM | POA: Diagnosis present

## 2022-11-01 DIAGNOSIS — I5032 Chronic diastolic (congestive) heart failure: Secondary | ICD-10-CM | POA: Diagnosis present

## 2022-11-01 LAB — CBC WITH DIFFERENTIAL/PLATELET
Abs Immature Granulocytes: 0.05 10*3/uL (ref 0.00–0.07)
Basophils Absolute: 0.1 10*3/uL (ref 0.0–0.1)
Basophils Relative: 1 %
Eosinophils Absolute: 0.5 10*3/uL (ref 0.0–0.5)
Eosinophils Relative: 5 %
HCT: 50.3 % (ref 39.0–52.0)
Hemoglobin: 15.5 g/dL (ref 13.0–17.0)
Immature Granulocytes: 1 %
Lymphocytes Relative: 20 %
Lymphs Abs: 1.8 10*3/uL (ref 0.7–4.0)
MCH: 28.4 pg (ref 26.0–34.0)
MCHC: 30.8 g/dL (ref 30.0–36.0)
MCV: 92.1 fL (ref 80.0–100.0)
Monocytes Absolute: 0.7 10*3/uL (ref 0.1–1.0)
Monocytes Relative: 8 %
Neutro Abs: 5.7 10*3/uL (ref 1.7–7.7)
Neutrophils Relative %: 65 %
Platelets: 206 10*3/uL (ref 150–400)
RBC: 5.46 MIL/uL (ref 4.22–5.81)
RDW: 13.7 % (ref 11.5–15.5)
WBC: 8.7 10*3/uL (ref 4.0–10.5)
nRBC: 0 % (ref 0.0–0.2)

## 2022-11-01 LAB — HEPARIN LEVEL (UNFRACTIONATED): Heparin Unfractionated: >1.1 IU/mL — ABNORMAL HIGH (ref 0.30–0.70)

## 2022-11-01 LAB — COMPREHENSIVE METABOLIC PANEL
ALT: 16 U/L (ref 0–44)
AST: 17 U/L (ref 15–41)
Albumin: 3.4 g/dL — ABNORMAL LOW (ref 3.5–5.0)
Alkaline Phosphatase: 77 U/L (ref 38–126)
Anion gap: 7 (ref 5–15)
BUN: 23 mg/dL (ref 8–23)
CO2: 32 mmol/L (ref 22–32)
Calcium: 8.9 mg/dL (ref 8.9–10.3)
Chloride: 100 mmol/L (ref 98–111)
Creatinine, Ser: 1.08 mg/dL (ref 0.61–1.24)
GFR, Estimated: >60 mL/min (ref >60)
Glucose, Bld: 133 mg/dL — ABNORMAL HIGH (ref 70–99)
Potassium: 3.8 mmol/L (ref 3.5–5.1)
Sodium: 139 mmol/L (ref 135–145)
Total Bilirubin: 1.3 mg/dL — ABNORMAL HIGH (ref 0.3–1.2)
Total Protein: 6.5 g/dL (ref 6.5–8.1)

## 2022-11-01 LAB — MAGNESIUM: Magnesium: 2 mg/dL (ref 1.7–2.4)

## 2022-11-01 LAB — GLUCOSE, CAPILLARY
Glucose-Capillary: 144 mg/dL — ABNORMAL HIGH (ref 70–99)
Glucose-Capillary: 150 mg/dL — ABNORMAL HIGH (ref 70–99)
Glucose-Capillary: 154 mg/dL — ABNORMAL HIGH (ref 70–99)
Glucose-Capillary: 151 mg/dL — ABNORMAL HIGH (ref 70–99)

## 2022-11-01 LAB — APTT
aPTT: 49 seconds — ABNORMAL HIGH (ref 24–36)
aPTT: 77 seconds — ABNORMAL HIGH (ref 24–36)
aPTT: 72 seconds — ABNORMAL HIGH (ref 24–36)

## 2022-11-01 MED ORDER — SODIUM CHLORIDE 0.9 % IV BOLUS
500.0000 mL | Freq: Once | INTRAVENOUS | Status: AC
  Administered 2022-11-01: 500 mL via INTRAVENOUS

## 2022-11-01 MED ORDER — ASPIRIN 81 MG PO CHEW
81.0000 mg | CHEWABLE_TABLET | ORAL | Status: AC
  Administered 2022-11-02: 81 mg via ORAL
  Filled 2022-11-01: qty 1

## 2022-11-01 MED ORDER — HEPARIN BOLUS VIA INFUSION
2600.0000 [IU] | Freq: Once | INTRAVENOUS | Status: AC
  Administered 2022-11-01: 2600 [IU] via INTRAVENOUS
  Filled 2022-11-01: qty 2600

## 2022-11-01 MED ORDER — SODIUM CHLORIDE 0.9 % WEIGHT BASED INFUSION
3.0000 mL/kg/h | INTRAVENOUS | Status: AC
  Administered 2022-11-02: 3 mL/kg/h via INTRAVENOUS

## 2022-11-01 MED ORDER — SODIUM CHLORIDE 0.9 % WEIGHT BASED INFUSION
1.0000 mL/kg/h | INTRAVENOUS | Status: DC
  Administered 2022-11-02: 1 mL/kg/h via INTRAVENOUS

## 2022-11-01 NOTE — Consult Note (Signed)
[facility] for Heparin infusion Indication: chest pain/ACS  Allergies  Allergen Reactions   Prednisone Other (See Comments) and Hypertension    Pt states that this medication puts him in A-fib    Demerol  [Meperidine Hcl]    Demerol [Meperidine] Hives   Jardiance [Empagliflozin] Other (See Comments)    Perineal pain   Sulfa Antibiotics Hives   Albuterol Sulfate [Albuterol] Palpitations and Other (See Comments)    Pt currently uses this medication.     Morphine Sulfate Nausea And Vomiting, Rash and Other (See Comments)    Pt states that he is only allergic to the tablet form of this medication.      Patient Measurements: Height: 5' 7"  (170.2 cm) Weight: 91.2 kg (201 lb) IBW/kg (Calculated) : 66.1 Heparin Dosing Weight: 85.2 kg  Vital Signs: Temp: 97.6 F (36.4 C) (12/17 0312) Temp Source: Oral (12/17 0312) BP: 115/69 (12/17 0312) Pulse Rate: 58 (12/17 0312)  Labs: Recent Labs    10/31/22 0116 10/31/22 0341 10/31/22 1002 10/31/22 1841 11/01/22 0435  HGB 16.7  --   --   --  15.5  HCT 52.6*  --   --   --  50.3  PLT 221  --   --   --  206  APTT  --   --   --  38* 49*  LABPROT  --   --   --  16.3*  --   INR  --   --   --  1.3*  --   HEPARINUNFRC  --   --   --  >1.10* >1.10*  CREATININE 0.87  --   --   --  1.08  TROPONINIHS 14 15 12   --   --      Estimated Creatinine Clearance: 70.5 mL/min (by C-G formula based on SCr of 1.08 mg/dL).   Medical History: Past Medical History:  Diagnosis Date   A-fib ([place])    Anemia    Anginal pain ([place])    Anxiety    Arthritis    Asthma    CAD (coronary artery disease)    a. 2002 CABGx2 (LIMA->LAD, VG->VG->OM1);  b. 09/2012 DES->OM;  c. 03/2015 PTCA of LAD [facility]) in setting of atretic LIMA; d. 05/2015 Cath [facility]): nonobs dzs; e. 06/2015 Cath ([facility]): LM nl, LAD 45p/d ISR, 50d, D1/2 small, LCX 50p/d ISR, OM1 70ost, 30 ISR, VG->OM1 50ost, 81m LIMA->LAD 99p/d - atretic, RCA dom, nl; f.cath 10/16:  40-50%(FFR 0.90) pLAD, 75% (FFR 0.77) mLAD s/p PCI/DES, oRCA 40% (FFR0.95)   Cancer (HCC)    SKIN CANCER ON BACK   Celiac disease    Chronic diastolic CHF (congestive heart failure) ([place]    a. 06/2009 Echo: EF 60-65%, Gr 1 DD, triv AI, mildly dil LA, nl RV.   COPD (chronic obstructive pulmonary disease) ([place]    a. Chronic bronchitis and emphysema.   DDD (degenerative disc disease), lumbar    Diverticulosis    Dysrhythmia    Essential hypertension    GERD (gastroesophageal reflux disease)    History of hiatal hernia    History of kidney stones    H/O   History of tobacco abuse    a. Quit 2014.   Myocardial infarction ([facility] 2002   4 STENTS   Pancreatitis    PSVT (paroxysmal supraventricular tachycardia)    a. 10/2012 Noted on Zio Patch.   Sleep apnea    LOST CORD TO CPAP -ONLY 02 @ BEDTIME   Tubular  adenoma of colon    Type II diabetes mellitus ([place])     Medications:  Apixaban PTA, last dose received as inpatient 12/16 @ 0919  Assessment: Patient admitted with chest pain. PMH includes HTN, DM2, Chronic Respiratory Failure on 2L [place], CAD s/p CABG x 2, 3007 PCI complicated by dissection, and multiple subsequent stents, and Afib on Amio and Eliquis. Pharmacy consulted to manage heparin infusion for ACS/STEMI with planned LHC on Monday.  Baseline aPTT and heparin level ordered.  Goal of Therapy:  Heparin level 0.3-0.7 units/ml aPTT 66-102 seconds Monitor platelets by anticoagulation protocol: Yes   12/17 0435 aPTT 48 subtherapeutic, HL > 1.1  Plan:  Bolus 2600 units x 1 Increase heparin infusion to 1300 units/hr Recheck aPTT level 6 hr after rate change HL daily until correlation with aPTT confirmed Continue to monitor H&H and platelets  Renda Rolls, PharmD, [facility] 11/01/2022 5:50 AM

## 2022-11-01 NOTE — Consult Note (Signed)
[facility] for Heparin infusion Indication: chest pain/ACS  Allergies  Allergen Reactions   Prednisone Other (See Comments) and Hypertension    Pt states that this medication puts him in A-fib    Demerol  [Meperidine Hcl]    Demerol [Meperidine] Hives   Jardiance [Empagliflozin] Other (See Comments)    Perineal pain   Sulfa Antibiotics Hives   Albuterol Sulfate [Albuterol] Palpitations and Other (See Comments)    Pt currently uses this medication.     Morphine Sulfate Nausea And Vomiting, Rash and Other (See Comments)    Pt states that he is only allergic to the tablet form of this medication.      Patient Measurements: Height: 5' 7"  (170.2 cm) Weight: 91.2 kg (201 lb) IBW/kg (Calculated) : 66.1 Heparin Dosing Weight: 85.2 kg  Vital Signs: Temp: 98.1 F (36.7 C) (12/17 1500) Temp Source: Oral (12/17 1153) BP: 93/55 (12/17 1500) Pulse Rate: 55 (12/17 1500)  Labs: Recent Labs    10/31/22 0116 10/31/22 0341 10/31/22 1002 10/31/22 1841 10/31/22 1841 11/01/22 0435 11/01/22 1224 11/01/22 1811  HGB 16.7  --   --   --   --  15.5  --   --   HCT 52.6*  --   --   --   --  50.3  --   --   PLT 221  --   --   --   --  206  --   --   APTT  --   --   --  38*   < > 49* 77* 72*  LABPROT  --   --   --  16.3*  --   --   --   --   INR  --   --   --  1.3*  --   --   --   --   HEPARINUNFRC  --   --   --  >1.10*  --  >1.10*  --   --   CREATININE 0.87  --   --   --   --  1.08  --   --   TROPONINIHS 14 15 12   --   --   --   --   --    < > = values in this interval not displayed.     Estimated Creatinine Clearance: 70.5 mL/min (by C-G formula based on SCr of 1.08 mg/dL).   Medical History: Past Medical History:  Diagnosis Date   A-fib ([place])    Anemia    Anginal pain ([place])    Anxiety    Arthritis    Asthma    CAD (coronary artery disease)    a. 2002 CABGx2 (LIMA->LAD, VG->VG->OM1);  b. 09/2012 DES->OM;  c. 03/2015 PTCA of LAD [facility]) in  setting of atretic LIMA; d. 05/2015 Cath [facility]): nonobs dzs; e. 06/2015 Cath ([facility]): LM nl, LAD 45p/d ISR, 50d, D1/2 small, LCX 50p/d ISR, OM1 70ost, 30 ISR, VG->OM1 50ost, 26m LIMA->LAD 99p/d - atretic, RCA dom, nl; f.cath 10/16: 40-50%(FFR 0.90) pLAD, 75% (FFR 0.77) mLAD s/p PCI/DES, oRCA 40% (FFR0.95)   Cancer (HCC)    SKIN CANCER ON BACK   Celiac disease    Chronic diastolic CHF (congestive heart failure) ([place]    a. 06/2009 Echo: EF 60-65%, Gr 1 DD, triv AI, mildly dil LA, nl RV.   COPD (chronic obstructive pulmonary disease) ([place]    a. Chronic bronchitis and emphysema.   DDD (degenerative disc disease), lumbar  Diverticulosis    Dysrhythmia    Essential hypertension    GERD (gastroesophageal reflux disease)    History of hiatal hernia    History of kidney stones    H/O   History of tobacco abuse    a. Quit 2014.   Myocardial infarction [facility]) 2002   4 STENTS   Pancreatitis    PSVT (paroxysmal supraventricular tachycardia)    a. 10/2012 Noted on Zio Patch.   Sleep apnea    LOST CORD TO CPAP -ONLY 02 @ BEDTIME   Tubular adenoma of colon    Type II diabetes mellitus ([place])     Medications:  Apixaban PTA, last dose received as inpatient 12/16 @ 0919  Assessment: Patient admitted with chest pain. PMH includes HTN, DM2, Chronic Respiratory Failure on 2L [place], CAD s/p CABG x 2, 2574 PCI complicated by dissection, and multiple subsequent stents, and Afib on Amio and Eliquis. Pharmacy consulted to manage heparin infusion for ACS/STEMI with planned LHC on Monday.  Baseline aPTT and heparin level ordered.  Goal of Therapy:  Heparin level 0.3-0.7 units/ml aPTT 66-102 seconds Monitor platelets by anticoagulation protocol: Yes   12/17 0435 aPTT 48 subtherapeutic, HL > 1.1 12/17 1224 aPTT 77 Therapeutic x1 12/17 1811 aPTT 72 Therapeutic x 2  Plan:  Continue heparin infusion at 1300 units/hr Check aPTT 12/18 with am labs HL daily until correlation with aPTT confirmed Continue to  monitor H&H and platelets  Lorin Picket, PharmD Clinical Pharmacist 11/01/2022

## 2022-11-01 NOTE — Progress Notes (Signed)
[facility] Cardiology    SUBJECTIVE: Patient complains of chronic persistent chest pain 9 out of 10 with no relief.  Patient seems to be resting comfortably however   Vitals:   11/01/22 0000 11/01/22 0312 11/01/22 0809 11/01/22 0902  BP: (!) 99/55 115/69 103/68 114/65  Pulse:  (!) 58 60   Resp: 18 18 18    Temp: (!) 97.5 F (36.4 C) 97.6 F (36.4 C) 97.7 F (36.5 C)   TempSrc: Oral Oral    SpO2: 99% 98% 95%   Weight:      Height:         Intake/Output Summary (Last 24 hours) at 11/01/2022 1023 Last data filed at 11/01/2022 0810 Gross per 24 hour  Intake --  Output 1900 ml  Net -1900 ml      PHYSICAL EXAM  General: Well developed, well nourished, in no acute distress HEENT:  Normocephalic and atramatic Neck:  No JVD.  Lungs: Clear bilaterally to auscultation and percussion. Heart: HRRR . Normal S1 and S2 without gallops or murmurs.  Abdomen: Bowel sounds are positive, abdomen soft and non-tender  Msk:  Back normal, normal gait. Normal strength and tone for age. Extremities: No clubbing, cyanosis or edema.   Neuro: Alert and oriented X 3. Psych:  Good affect, responds appropriately   LABS: Basic Metabolic Panel: Recent Labs    10/31/22 0116 11/01/22 0435  NA 141 139  K 4.0 3.8  CL 104 100  CO2 29 32  GLUCOSE 145* 133*  BUN 17 23  CREATININE 0.87 1.08  CALCIUM 9.1 8.9  MG  --  2.0   Liver Function Tests: Recent Labs    10/31/22 0116 11/01/22 0435  AST 19 17  ALT 13 16  ALKPHOS 80 77  BILITOT 0.5 1.3*  PROT 7.3 6.5  ALBUMIN 3.7 3.4*   Recent Labs    10/31/22 0116  LIPASE 88*   CBC: Recent Labs    10/31/22 0116 11/01/22 0435  WBC 9.2 8.7  NEUTROABS 5.7 5.7  HGB 16.7 15.5  HCT 52.6* 50.3  MCV 91.8 92.1  PLT 221 206   Cardiac Enzymes: No results for input(s): "CKTOTAL", "CKMB", "CKMBINDEX", "TROPONINI" in the last 72 hours. BNP: Invalid input(s): "POCBNP" D-Dimer: No results for input(s): "DDIMER" in the last 72 hours. Hemoglobin  A1C: No results for input(s): "HGBA1C" in the last 72 hours. Fasting Lipid Panel: No results for input(s): "CHOL", "HDL", "LDLCALC", "TRIG", "CHOLHDL", "LDLDIRECT" in the last 72 hours. Thyroid Function Tests: No results for input(s): "TSH", "T4TOTAL", "T3FREE", "THYROIDAB" in the last 72 hours.  Invalid input(s): "FREET3" Anemia Panel: No results for input(s): "VITAMINB12", "FOLATE", "FERRITIN", "TIBC", "IRON", "RETICCTPCT" in the last 72 hours.  DG Chest Port 1 View  Result Date: 10/31/2022 CLINICAL DATA:  Chest pain EXAM: PORTABLE CHEST 1 VIEW COMPARISON:  Chest x-ray 08/07/2022 FINDINGS: Heart is enlarged, unchanged. There central pulmonary vascular congestion. There some interstitial opacities in the left lung base. There is no lung consolidation, pleural effusion or pneumothorax. Patient is status post cardiac surgery. No fractures are identified. IMPRESSION: 1. Cardiomegaly with central pulmonary vascular congestion. 2. Interstitial opacities in the left lung base, likely due to edema. Electronically Signed   By: Ronney Asters M.D.   On: 10/31/2022 01:51     Echo mildly depressed left ventricular function EF around 45%  TELEMETRY: Normal sinus rhythm rate of 75.  ECG changes:  ASSESSMENT AND PLAN:  Principal Problem:   Chest pain Active Problems:   CAD (coronary artery  disease)   GERD (gastroesophageal reflux disease)   HLD (hyperlipidemia)   Atrial fibrillation (HCC)   Type 2 diabetes mellitus without complications (HCC)    Plan Chronic chest pain recurrent anginal symptoms continue heparin continue antianginals consider cardiac cath in the morning GERD currently stable continue PPI therapy as necessary for reflux Atrial fibrillation was on Eliquis currently on heparin short-term continue rate control Diabetes chronic stable continue diabetes management and control Maintain statin therapy for hyperlipidemia Chronic pain syndrome unclear which anginal versus chronic  chest pain symptoms with noncardiac etiology   Yolonda Kida, MD,  11/01/2022 10:23 AM

## 2022-11-01 NOTE — Consult Note (Signed)
[place] for Heparin infusion Indication: chest pain/ACS  Allergies  Allergen Reactions   Prednisone Other (See Comments) and Hypertension    Pt states that this medication puts him in A-fib    Demerol  [Meperidine Hcl]    Demerol [Meperidine] Hives   Jardiance [Empagliflozin] Other (See Comments)    Perineal pain   Sulfa Antibiotics Hives   Albuterol Sulfate [Albuterol] Palpitations and Other (See Comments)    Pt currently uses this medication.     Morphine Sulfate Nausea And Vomiting, Rash and Other (See Comments)    Pt states that he is only allergic to the tablet form of this medication.      Patient Measurements: Height: 5' 7"  (170.2 cm) Weight: 91.2 kg (201 lb) IBW/kg (Calculated) : 66.1 Heparin Dosing Weight: 85.2 kg  Vital Signs: Temp: 97.8 F (36.6 C) (12/17 1153) Temp Source: Oral (12/17 1153) BP: 89/57 (12/17 1153) Pulse Rate: 57 (12/17 1153)  Labs: Recent Labs    10/31/22 0116 10/31/22 0341 10/31/22 1002 10/31/22 1841 11/01/22 0435 11/01/22 1224  HGB 16.7  --   --   --  15.5  --   HCT 52.6*  --   --   --  50.3  --   PLT 221  --   --   --  206  --   APTT  --   --   --  38* 49* 77*  LABPROT  --   --   --  16.3*  --   --   INR  --   --   --  1.3*  --   --   HEPARINUNFRC  --   --   --  >1.10* >1.10*  --   CREATININE 0.87  --   --   --  1.08  --   TROPONINIHS 14 15 12   --   --   --      Estimated Creatinine Clearance: 70.5 mL/min (by C-G formula based on SCr of 1.08 mg/dL).   Medical History: Past Medical History:  Diagnosis Date   A-fib ()    Anemia    Anginal pain ([place])    Anxiety    Arthritis    Asthma    CAD (coronary artery disease)    a. 2002 CABGx2 (LIMA->LAD, VG->VG->OM1);  b. 09/2012 DES->OM;  c. 03/2015 PTCA of LAD [facility]) in setting of atretic LIMA; d. 05/2015 Cath [facility]): nonobs dzs; e. 06/2015 Cath ([facility]): LM nl, LAD 45p/d ISR, 50d, D1/2 small, LCX 50p/d ISR, OM1 70ost, 30 ISR, VG->OM1 50ost,  46m LIMA->LAD 99p/d - atretic, RCA dom, nl; f.cath 10/16: 40-50%(FFR 0.90) pLAD, 75% (FFR 0.77) mLAD s/p PCI/DES, oRCA 40% (FFR0.95)   Cancer (HCC)    SKIN CANCER ON BACK   Celiac disease    Chronic diastolic CHF (congestive heart failure) ([place]    a. 06/2009 Echo: EF 60-65%, Gr 1 DD, triv AI, mildly dil LA, nl RV.   COPD (chronic obstructive pulmonary disease) ([place]    a. Chronic bronchitis and emphysema.   DDD (degenerative disc disease), lumbar    Diverticulosis    Dysrhythmia    Essential hypertension    GERD (gastroesophageal reflux disease)    History of hiatal hernia    History of kidney stones    H/O   History of tobacco abuse    a. Quit 2014.   Myocardial infarction ([facility] 2002   4 STENTS   Pancreatitis    PSVT (paroxysmal supraventricular tachycardia)  a. 10/2012 Noted on Zio Patch.   Sleep apnea    LOST CORD TO CPAP -ONLY 02 @ BEDTIME   Tubular adenoma of colon    Type II diabetes mellitus ([place])     Medications:  Apixaban PTA, last dose received as inpatient 12/16 @ 0919  Assessment: Patient admitted with chest pain. PMH includes HTN, DM2, Chronic Respiratory Failure on 2L [place], CAD s/p CABG x 2, 1735 PCI complicated by dissection, and multiple subsequent stents, and Afib on Amio and Eliquis. Pharmacy consulted to manage heparin infusion for ACS/STEMI with planned LHC on Monday.  Baseline aPTT and heparin level ordered.  Goal of Therapy:  Heparin level 0.3-0.7 units/ml aPTT 66-102 seconds Monitor platelets by anticoagulation protocol: Yes   12/17 0435 aPTT 48 subtherapeutic, HL > 1.1 12/17 1224 aPTT 77 Therapeutic x1  Plan:  Continue heparin infusion at 1300 units/hr check confirmatory aPTT level in 6 hr  HL daily until correlation with aPTT confirmed Continue to monitor H&H and platelets  Chinita Greenland PharmD Clinical Pharmacist 11/01/2022

## 2022-11-01 NOTE — Progress Notes (Addendum)
Progress Note   Patient: Lawrence [name] PJA:[id number] DOB: [birth year] DOA: 10/31/2022     0 DOS: the patient was seen and examined on 11/01/2022   Brief hospital course: 66 yo male with a PMH of HTN, DM2, Chronic Respiratory Failure on 2L [place], CAD s/p CABG x 2, 3419 PCI complicated by dissection, and multiple subsequent stents, and PAF on Amio and Eliquis who was recently seen at [facility] on 08/07/22 for chest pain and sent home on ranexa isosorbide. He returns to ED with recurrent chest pain. EKG is unchanged. Troponins are wnl x 2.    12/16 - Cardiology is planning LHC on Monday. 12/17 - Blood pressure is running low with SBP < 90 mmHg, and NS bolus was given.  Herat rate is 38-50 beats/min.  Echo was ordered.   Assessment and Plan: Hypotension:  - Bolus Normal Saline and monitor.  Asymptomatic Bradycardia: I am calling this asymptomatic but patient is reporting lightheadedness occasionally.  It is unclear if this is due to morphine he is receiving, hypotension, or bradycardia. - Monitor symptoms. - Hold Metoprolol and Diltiazem. - Check Echo.  Unstable Angina in the setting of CAD s/p CABG and multiple stents: - Continue Heparin Drip and Aspirin.   - Plan is for [facility] on Monday.  Appreciate Cardiology assistance and recommendations. - Continue Imdur and Ranexa.  Give Nitro paste and IV Morphine PRN for pain complaints.  CAD (coronary artery disease) - Continue Aspirin, Statin, BB, Nitro PRN, Morphine PRN, Oxygen PRN. - Continue home Imdur and Ranexa.  Atrial Fibrillation: - Continue Amiodarone for rhythm control.  Hold Metoprolol and Diltiazem due to low BP/HR. - Hold Eliquis for LHC.  Continue heparin drip for anticoagulation.  Type 2 diabetes mellitus without complications ([place]) - Continue SSI ACHS.  Hyperlipidemia: - Statin  GERD: - PPI      Subjective: Lawrence [name] endorses persistent chest pain.  He denies any nausea, vomiting, back pain, headaches, sweating, arm pain,  jaw pain, or shortness of breath.  Physical Exam: Vitals:   11/01/22 0809 11/01/22 0902 11/01/22 1153 11/01/22 1400  BP: 103/68 114/65 (!) 89/57 95/62  Pulse: 60  (!) 57   Resp: 18  19   Temp: 97.7 F (36.5 C)  97.8 F (36.6 C)   TempSrc:   Oral   SpO2: 95%  97%   Weight:      Height:       Examination: General exam: chronically ill appearing male lying in bed with persistent chest pain, non-reproducible on exam HEENT: NCAT, PERRL Respiratory system: CTAB no WRR Cardiovascular system: 1/6 systolic murmur, regular, No JVD. Gastrointestinal system: No flank pain, Abdomen soft, NT,ND, BS+. Nervous System: No focal deficits. Extremities: No edema, distal peripheral pulses palpable.  Skin: No rashes, scattered bruises MSK: Physical Deconditioning  Data Reviewed:  There are no new results to review at this time.  DG Chest Port 1 View CLINICAL DATA:  Chest pain  EXAM: PORTABLE CHEST 1 VIEW  COMPARISON:  Chest x-ray 08/07/2022  FINDINGS: Heart is enlarged, unchanged. There central pulmonary vascular congestion. There some interstitial opacities in the left lung base. There is no lung consolidation, pleural effusion or pneumothorax. Patient is status post cardiac surgery. No fractures are identified.  IMPRESSION: 1. Cardiomegaly with central pulmonary vascular congestion. 2. Interstitial opacities in the left lung base, likely due to edema.  Electronically Signed   By: Lawrence [name] M.D.   On: 10/31/2022 01:51    Family Communication: I spoke with patient  only.  Disposition: Status is: Observation The patient remains OBS appropriate and will d/c before 2 midnights.  Planned Discharge Destination: Home    Time spent: >30 minutes  Author: George Hugh, MD 11/01/2022 2:21 PM  For on call review [email or web address].

## 2022-11-02 ENCOUNTER — Inpatient Hospital Stay (HOSPITAL_COMMUNITY)
Admit: 2022-11-02 | Discharge: 2022-11-02 | Disposition: A | Discharge status: Home / Self Care | Payer: Medicare HMO | Attending: Internal Medicine | Admitting: Internal Medicine

## 2022-11-02 ENCOUNTER — Encounter
Admission: EM | Disposition: A | Discharge status: Home / Self Care | Payer: Self-pay | Source: Home / Self Care | Attending: Internal Medicine

## 2022-11-02 DIAGNOSIS — E669 Obesity, unspecified: Secondary | ICD-10-CM

## 2022-11-02 DIAGNOSIS — R0609 Other forms of dyspnea: Secondary | ICD-10-CM | POA: Diagnosis not present

## 2022-11-02 DIAGNOSIS — J449 Chronic obstructive pulmonary disease, unspecified: Secondary | ICD-10-CM

## 2022-11-02 DIAGNOSIS — R079 Chest pain, unspecified: Secondary | ICD-10-CM

## 2022-11-02 DIAGNOSIS — I4819 Other persistent atrial fibrillation: Secondary | ICD-10-CM

## 2022-11-02 DIAGNOSIS — E785 Hyperlipidemia, unspecified: Secondary | ICD-10-CM | POA: Diagnosis not present

## 2022-11-02 DIAGNOSIS — I2510 Atherosclerotic heart disease of native coronary artery without angina pectoris: Secondary | ICD-10-CM | POA: Diagnosis not present

## 2022-11-02 DIAGNOSIS — Z951 Presence of aortocoronary bypass graft: Secondary | ICD-10-CM

## 2022-11-02 HISTORY — PX: LEFT HEART CATH AND CORS/GRAFTS ANGIOGRAPHY: CATH118250

## 2022-11-02 LAB — GLUCOSE, CAPILLARY
Glucose-Capillary: 120 mg/dL — ABNORMAL HIGH (ref 70–99)
Glucose-Capillary: 147 mg/dL — ABNORMAL HIGH (ref 70–99)
Glucose-Capillary: 129 mg/dL — ABNORMAL HIGH (ref 70–99)
Glucose-Capillary: 104 mg/dL — ABNORMAL HIGH (ref 70–99)
Glucose-Capillary: 223 mg/dL — ABNORMAL HIGH (ref 70–99)

## 2022-11-02 LAB — ECHOCARDIOGRAM COMPLETE
AR max vel: 2.86 cm2
AV Area VTI: 3.42 cm2
AV Area mean vel: 2.49 cm2
AV Mean grad: 4 mmHg
AV Peak grad: 7.4 mmHg
Ao pk vel: 1.36 m/s
Area-P 1/2: 4.01 cm2
Height: 67 in
S' Lateral: 2.9 cm
Weight: 3216 oz

## 2022-11-02 LAB — BASIC METABOLIC PANEL
Anion gap: 8 (ref 5–15)
BUN: 30 mg/dL — ABNORMAL HIGH (ref 8–23)
CO2: 34 mmol/L — ABNORMAL HIGH (ref 22–32)
Calcium: 9.2 mg/dL (ref 8.9–10.3)
Chloride: 97 mmol/L — ABNORMAL LOW (ref 98–111)
Creatinine, Ser: 1.32 mg/dL — ABNORMAL HIGH (ref 0.61–1.24)
GFR, Estimated: 59 mL/min — ABNORMAL LOW (ref >60)
Glucose, Bld: 112 mg/dL — ABNORMAL HIGH (ref 70–99)
Potassium: 3.6 mmol/L (ref 3.5–5.1)
Sodium: 139 mmol/L (ref 135–145)

## 2022-11-02 LAB — HEPARIN LEVEL (UNFRACTIONATED): Heparin Unfractionated: 0.66 IU/mL (ref 0.30–0.70)

## 2022-11-02 LAB — HEMOGLOBIN A1C
Hgb A1c MFr Bld: 7.5 % — ABNORMAL HIGH (ref 4.8–5.6)
Mean Plasma Glucose: 169 mg/dL

## 2022-11-02 LAB — APTT: aPTT: 72 seconds — ABNORMAL HIGH (ref 24–36)

## 2022-11-02 SURGERY — LEFT HEART CATH AND CORS/GRAFTS ANGIOGRAPHY
Anesthesia: Moderate Sedation

## 2022-11-02 MED ORDER — MIDAZOLAM HCL 2 MG/2ML IJ SOLN
INTRAMUSCULAR | Status: DC | PRN
  Administered 2022-11-02: 1 mg via INTRAVENOUS

## 2022-11-02 MED ORDER — MIDAZOLAM HCL 2 MG/2ML IJ SOLN
INTRAMUSCULAR | Status: AC
  Filled 2022-11-02: qty 2

## 2022-11-02 MED ORDER — LABETALOL HCL 5 MG/ML IV SOLN: 10.0000 mg | INTRAVENOUS | Status: AC | PRN

## 2022-11-02 MED ORDER — HYDRALAZINE HCL 20 MG/ML IJ SOLN: 10.0000 mg | INTRAMUSCULAR | Status: AC | PRN

## 2022-11-02 MED ORDER — HEPARIN (PORCINE) IN NACL 1000-0.9 UT/500ML-% IV SOLN
INTRAVENOUS | Status: DC | PRN
  Administered 2022-11-02 (×2): 500 mL

## 2022-11-02 MED ORDER — FENTANYL CITRATE (PF) 100 MCG/2ML IJ SOLN
INTRAMUSCULAR | Status: AC
  Filled 2022-11-02: qty 2

## 2022-11-02 MED ORDER — IOHEXOL 300 MG/ML  SOLN
INTRAMUSCULAR | Status: DC | PRN
  Administered 2022-11-02: 106 mL

## 2022-11-02 MED ORDER — FENTANYL CITRATE (PF) 100 MCG/2ML IJ SOLN
INTRAMUSCULAR | Status: DC | PRN
  Administered 2022-11-02: 25 ug via INTRAVENOUS

## 2022-11-02 MED ORDER — ACETAMINOPHEN 325 MG PO TABS: 650.0000 mg | ORAL_TABLET | ORAL | Status: DC | PRN

## 2022-11-02 MED ORDER — ONDANSETRON HCL 4 MG/2ML IJ SOLN: 4.0000 mg | Freq: Four times a day (QID) | INTRAMUSCULAR | Status: DC | PRN

## 2022-11-02 MED ORDER — SODIUM CHLORIDE 0.9% FLUSH: 3.0000 mL | INTRAVENOUS | Status: DC | PRN

## 2022-11-02 MED ORDER — SODIUM CHLORIDE 0.9% FLUSH: 3.0000 mL | Freq: Two times a day (BID) | INTRAVENOUS | Status: DC

## 2022-11-02 MED ORDER — HEPARIN (PORCINE) IN NACL 1000-0.9 UT/500ML-% IV SOLN
INTRAVENOUS | Status: AC
  Filled 2022-11-02: qty 1000

## 2022-11-02 MED ORDER — SODIUM CHLORIDE 0.9 % IV SOLN: 250.0000 mL | INTRAVENOUS | Status: DC | PRN

## 2022-11-02 MED ORDER — LIDOCAINE HCL (PF) 1 % IJ SOLN
INTRAMUSCULAR | Status: DC | PRN
  Administered 2022-11-02: 20 mL

## 2022-11-02 MED ORDER — SODIUM CHLORIDE 0.9 % WEIGHT BASED INFUSION
1.0000 mL/kg/h | INTRAVENOUS | Status: DC
  Administered 2022-11-02: 1 mL/kg/h via INTRAVENOUS

## 2022-11-02 SURGICAL SUPPLY — 11 items
CATH INFINITI 5FR MULTPACK ANG (CATHETERS) IMPLANT
DEVICE CLOSURE MYNXGRIP 5F (Vascular Products) IMPLANT
DRAPE BRACHIAL (DRAPES) IMPLANT
NDL PERC 18GX7CM (NEEDLE) IMPLANT
NEEDLE PERC 18GX7CM (NEEDLE) ×1 IMPLANT
PACK CARDIAC CATH (CUSTOM PROCEDURE TRAY) ×1 IMPLANT
PROTECTION STATION PRESSURIZED (MISCELLANEOUS) ×1
SET ATX SIMPLICITY (MISCELLANEOUS) IMPLANT
SHEATH AVANTI 5FR X 11CM (SHEATH) IMPLANT
STATION PROTECTION PRESSURIZED (MISCELLANEOUS) IMPLANT
WIRE GUIDERIGHT .035X150 (WIRE) IMPLANT

## 2022-11-02 NOTE — Progress Notes (Signed)
*  PRELIMINARY RESULTS* Echocardiogram 2D Echocardiogram has been performed.  Sherrie Sport 11/02/2022, 8:58 AM

## 2022-11-02 NOTE — Progress Notes (Signed)
[facility] Cardiology    SUBJECTIVE: Patient has chronic chest pain chronic angina patient consistently complains of 9 out of 10 chest pain that is unrelieved   Vitals:   11/01/22 2027 11/02/22 0008 11/02/22 0304 11/02/22 0739  BP: 119/75 114/78 118/60 123/78  Pulse: (!) 107 68 (!) 56 62  Resp: 20 19 20 20   Temp: 97.7 F (36.5 C) 98.1 F (36.7 C) 98 F (36.7 C) 98.7 F (37.1 C)  TempSrc: Oral Oral Oral Oral  SpO2: 99% 94% 92% 95%  Weight:      Height:         Intake/Output Summary (Last 24 hours) at 11/02/2022 2820 Last data filed at 11/02/2022 0759 Gross per 24 hour  Intake 916.72 ml  Output 1450 ml  Net -533.28 ml      PHYSICAL EXAM  General: Well developed, well nourished, in no acute distress HEENT:  Normocephalic and atramatic Neck:  No JVD.  Lungs: Clear bilaterally to auscultation and percussion. Heart: HRRR . Normal S1 and S2 without gallops or murmurs.  Abdomen: Bowel sounds are positive, abdomen soft and non-tender  Msk:  Back normal, normal gait. Normal strength and tone for age. Extremities: No clubbing, cyanosis or edema.   Neuro: Alert and oriented X 3. Psych:  Good affect, responds appropriately   LABS: Basic Metabolic Panel: Recent Labs    11/01/22 0435 11/02/22 0330  NA 139 139  K 3.8 3.6  CL 100 97*  CO2 32 34*  GLUCOSE 133* 112*  BUN 23 30*  CREATININE 1.08 1.32*  CALCIUM 8.9 9.2  MG 2.0  --    Liver Function Tests: Recent Labs    10/31/22 0116 11/01/22 0435  AST 19 17  ALT 13 16  ALKPHOS 80 77  BILITOT 0.5 1.3*  PROT 7.3 6.5  ALBUMIN 3.7 3.4*   Recent Labs    10/31/22 0116  LIPASE 88*   CBC: Recent Labs    10/31/22 0116 11/01/22 0435  WBC 9.2 8.7  NEUTROABS 5.7 5.7  HGB 16.7 15.5  HCT 52.6* 50.3  MCV 91.8 92.1  PLT 221 206   Cardiac Enzymes: No results for input(s): "CKTOTAL", "CKMB", "CKMBINDEX", "TROPONINI" in the last 72 hours. BNP: Invalid input(s): "POCBNP" D-Dimer: No results for input(s): "DDIMER" in  the last 72 hours. Hemoglobin A1C: No results for input(s): "HGBA1C" in the last 72 hours. Fasting Lipid Panel: No results for input(s): "CHOL", "HDL", "LDLCALC", "TRIG", "CHOLHDL", "LDLDIRECT" in the last 72 hours. Thyroid Function Tests: No results for input(s): "TSH", "T4TOTAL", "T3FREE", "THYROIDAB" in the last 72 hours.  Invalid input(s): "FREET3" Anemia Panel: No results for input(s): "VITAMINB12", "FOLATE", "FERRITIN", "TIBC", "IRON", "RETICCTPCT" in the last 72 hours.  No results found.   Echo pending today Previous echo June 2022 with mildly depressed left ventricular function at 45 to 50%  TELEMETRY: Atrial fibrillation rate of 60 nonspecific ST-T changes:  ASSESSMENT AND PLAN:  Principal Problem:   Chest pain Active Problems:   CAD (coronary artery disease)   GERD (gastroesophageal reflux disease)   HLD (hyperlipidemia)   Atrial fibrillation (HCC)   Type 2 diabetes mellitus without complications ([place])    Plan   Yolonda Kida, MD 11/02/2022 8:29 AM

## 2022-11-02 NOTE — Consult Note (Signed)
[place] for Heparin infusion Indication: chest pain/ACS  Allergies  Allergen Reactions   Prednisone Other (See Comments) and Hypertension    Pt states that this medication puts him in A-fib    Demerol  [Meperidine Hcl]    Demerol [Meperidine] Hives   Jardiance [Empagliflozin] Other (See Comments)    Perineal pain   Sulfa Antibiotics Hives   Albuterol Sulfate [Albuterol] Palpitations and Other (See Comments)    Pt currently uses this medication.     Morphine Sulfate Nausea And Vomiting, Rash and Other (See Comments)    Pt states that he is only allergic to the tablet form of this medication.      Patient Measurements: Height: 5' 7"  (170.2 cm) Weight: 91.2 kg (201 lb) IBW/kg (Calculated) : 66.1 Heparin Dosing Weight: 85.2 kg  Vital Signs: Temp: 98 F (36.7 C) (12/18 0304) Temp Source: Oral (12/18 0304) BP: 118/60 (12/18 0304) Pulse Rate: 56 (12/18 0304)  Labs: Recent Labs    10/31/22 0116 10/31/22 0341 10/31/22 1002 10/31/22 1841 10/31/22 1841 11/01/22 0435 11/01/22 1224 11/01/22 1811 11/02/22 0330  HGB 16.7  --   --   --   --  15.5  --   --   --   HCT 52.6*  --   --   --   --  50.3  --   --   --   PLT 221  --   --   --   --  206  --   --   --   APTT  --   --   --  38*   < > 49* 77* 72* 72*  LABPROT  --   --   --  16.3*  --   --   --   --   --   INR  --   --   --  1.3*  --   --   --   --   --   HEPARINUNFRC  --   --   --  >1.10*  --  >1.10*  --   --  0.66  CREATININE 0.87  --   --   --   --  1.08  --   --  1.32*  TROPONINIHS 14 15 12   --   --   --   --   --   --    < > = values in this interval not displayed.     Estimated Creatinine Clearance: 57.7 mL/min (A) (by C-G formula based on SCr of 1.32 mg/dL (H)).   Medical History: Past Medical History:  Diagnosis Date   A-fib ([place])    Anemia    Anginal pain ([place])    Anxiety    Arthritis    Asthma    CAD (coronary artery disease)    a. 2002 CABGx2 (LIMA->LAD,  VG->VG->OM1);  b. 09/2012 DES->OM;  c. 03/2015 PTCA of LAD [facility]) in setting of atretic LIMA; d. 05/2015 Cath [facility]): nonobs dzs; e. 06/2015 Cath ([facility]): LM nl, LAD 45p/d ISR, 50d, D1/2 small, LCX 50p/d ISR, OM1 70ost, 30 ISR, VG->OM1 50ost, 26m LIMA->LAD 99p/d - atretic, RCA dom, nl; f.cath 10/16: 40-50%(FFR 0.90) pLAD, 75% (FFR 0.77) mLAD s/p PCI/DES, oRCA 40% (FFR0.95)   Cancer (HCC)    SKIN CANCER ON BACK   Celiac disease    Chronic diastolic CHF (congestive heart failure) ([place]    a. 06/2009 Echo: EF 60-65%, Gr 1 DD, triv AI, mildly dil LA, nl RV.  COPD (chronic obstructive pulmonary disease) ([place])    a. Chronic bronchitis and emphysema.   DDD (degenerative disc disease), lumbar    Diverticulosis    Dysrhythmia    Essential hypertension    GERD (gastroesophageal reflux disease)    History of hiatal hernia    History of kidney stones    H/O   History of tobacco abuse    a. Quit 2014.   Myocardial infarction [facility]) 2002   4 STENTS   Pancreatitis    PSVT (paroxysmal supraventricular tachycardia)    a. 10/2012 Noted on Zio Patch.   Sleep apnea    LOST CORD TO CPAP -ONLY 02 @ BEDTIME   Tubular adenoma of colon    Type II diabetes mellitus ([place])     Medications:  Apixaban PTA, last dose received as inpatient 12/16 @ 0919  Assessment: Patient admitted with chest pain. PMH includes HTN, DM2, Chronic Respiratory Failure on 2L [place], CAD s/p CABG x 2, 6945 PCI complicated by dissection, and multiple subsequent stents, and Afib on Amio and Eliquis. Pharmacy consulted to manage heparin infusion for ACS/STEMI with planned LHC on Monday.  Baseline aPTT and heparin level ordered.  Goal of Therapy:  Heparin level 0.3-0.7 units/ml aPTT 66-102 seconds Monitor platelets by anticoagulation protocol: Yes   12/17 0435 aPTT 48 subtherapeutic, HL > 1.1 12/17 1224 aPTT 77 Therapeutic x1 12/17 1811 aPTT 72 Therapeutic x 2 12/18 0330 aPTT 72, therapeutic x 3, HL 0.66  Plan:  Continue heparin  infusion at 1300 units/hr Check aPTT 12/19 with am labs Check HL w/ AM labs to confirm correlation Continue to monitor H&H and platelets  Renda Rolls, PharmD, [facility] 11/02/2022 5:18 AM

## 2022-11-02 NOTE — CV Procedure (Signed)
Brief cath note  Patient brought to the cardiac Cath Lab because of persistent anginal symptoms.  Patient has known coronary disease previous coronary bypass surgery but persistent daily chronic angina last cath was June 2022  Cath Mild reduced left ventricular function with anterior apical hypokinesis EF around 40 to 45% Coronaries Left main large minor irregularities LAD large widely patent stents proximal and mid minor irregularities Circumflex large minor irregularities OM1 patent graft RCA large minor irregularities All vessels with TIMI-3 flow  Grafts SVG to OM1 widely patent LIMA to LAD atretic previously known  Conclusion Cath with no new obstructive coronary disease Images look improved from previous cath 18 months ago.  No indication or evidence for his persistent chest pain which suggest that this is probably noncardiac chest pain and not truly angina should treat patient medically hopefully can be discharged tomorrow.

## 2022-11-02 NOTE — Plan of Care (Signed)
  Problem: Health Behavior/Discharge Planning: Goal: Ability to manage health-related needs will improve Outcome: Progressing   Problem: Clinical Measurements: Goal: Ability to maintain clinical measurements within normal limits will improve Outcome: Progressing   

## 2022-11-02 NOTE — Progress Notes (Signed)
PROGRESS NOTE  Lawrence [name] GQQ:[id number] DOB: Oct 09, 1956   PCP: Birdie Sons, MD  Patient is from: [facility]  DOA: 10/31/2022 LOS: 1  Chief complaints Chief Complaint  Patient presents with   Chest Pain     Brief Narrative / Interim history: 66 yo male with a PMH of HTN, DM2, chronic hypoxic RF on 2L [place], CAD s/p CABG x 2, 6712 PCI complicated by dissection, and multiple subsequent stents, and PAF on Amio and Eliquis who was recently seen at [facility] on 08/07/22 for chest pain and sent home on ranexa isosorbide. He returns to ED with recurrent chest pain. EKG is unchanged. Troponins are wnl x 2.  Started on heparin.  Cardiology consulted.   Plan for left heart catheterization today.  Subjective: Seen and examined earlier this morning before he went down for left heart catheterization.  Continues to endorse left-sided chest pain.  He describes the pain as sharp.  Pain is constant for most part.  No radiation.  No provoking factor.  However, he reports exertional dyspnea and exertional chest pain at baseline.  He currently rates his pain 9/10 although he does not appear to be in that much distress.  Objective: Vitals:   11/02/22 1019 11/02/22 1138 11/02/22 1427 11/02/22 1528  BP:  117/77 (!) 142/84   Pulse:  67 73   Resp:  20 16   Temp:  98.9 F (37.2 C) 97.8 F (36.6 C)   TempSrc:  Oral Oral   SpO2: 95% 90% 91% 91%  Weight:      Height:        Examination:  GENERAL: No apparent distress.  Nontoxic. HEENT: MMM.  Vision and hearing grossly intact.  NECK: Supple.  No apparent JVD.  RESP:  No IWOB.  Fair aeration bilaterally. CVS:  RRR. Heart sounds normal.  ABD/GI/GU: BS+. Abd soft, NTND.  MSK/EXT:  Moves extremities. No apparent deformity.  No tenderness to palpation in his chest. SKIN: no apparent skin lesion or wound NEURO: Awake, alert and oriented appropriately.  No apparent focal neuro deficit. PSYCH: Calm. Normal affect.   Procedures:  None  Microbiology  summarized: [id number], influenza and RSV PCR negative.  Assessment and plan: Principal Problem:   Chest pain Active Problems:   CAD (coronary artery disease)   GERD (gastroesophageal reflux disease)   HLD (hyperlipidemia)   Atrial fibrillation (HCC)   Type 2 diabetes mellitus without complications ([place])   Atypical chest pain: reports left-sided chest pain for months that has gradually gotten worse.  His pain is atypical but he also seems to have some exertional dyspnea and chest pain.  Has significant risk factors with CAD s/p CABG and multiple stents. -Continue heparin drip, aspirin, statin, Imdur, Ranexa, Nitropaste and IV morphine -Plan for LHC later today. -Follow echocardiogram.  History of CAD/CABG/multiple stents -Management as above.  Persistent A-fib: Rate controlled.  Seems to be on amiodarone 200 mg twice daily since August.  Also on Cardizem and metoprolol.  On Eliquis for anticoagulation. -Continue amiodarone 200 mg twice daily -Cardizem and metoprolol on hold due to hypotension and bradycardia..  Hypotension: Resolved. -Continue home Imdur-180 mg daily  Bradycardia: Likely from nodal blocking agent.  Resolved. -Continue holding Toprol-XL and Cardizem  Uncontrolled NIDDM-2 with hyperglycemia: A1c 7.5%. Recent Labs  Lab 11/01/22 1657 11/01/22 2131 11/02/22 0737 11/02/22 1135 11/02/22 1430  GLUCAP 154* 151* 120* 147* 129*  -Continue current insulin regimen -May resume Farxiga after procedure. -Continue holding metformin  Chronic COPD: Stable. -Continue home  inhalers.  Mood disorder/chronic pain -Resume home meds after LHC.  Obesity Body mass index is 31.48 kg/m.           DVT prophylaxis:  SCDs Start: 10/31/22 0981  Code Status: Full code Family Communication: None at bedside Level of care: Telemetry Medical Status is: Inpatient Remains inpatient appropriate because: Chest pain evaluation   Final disposition: Likely home once medically  clear. Consultants:  Cardiology  Sch Meds:  Scheduled Meds:  [MAR Hold] ALPRAZolam  1 mg Oral TID   [MAR Hold] amiodarone  200 mg Oral BID   [MAR Hold] aspirin  81 mg Oral Daily   [MAR Hold] atorvastatin  80 mg Oral QHS   [MAR Hold] fluticasone furoate-vilanterol  1 puff Inhalation Daily   [MAR Hold] insulin aspart  0-15 Units Subcutaneous TID WC   [MAR Hold] insulin aspart  0-5 Units Subcutaneous QHS   [MAR Hold] isosorbide mononitrate  180 mg Oral Daily   [MAR Hold] pantoprazole  40 mg Oral Daily   [MAR Hold] ranolazine  1,000 mg Oral BID   [MAR Hold] sodium chloride flush  3 mL Intravenous Q12H   [MAR Hold] tiotropium  18 mcg Inhalation Daily   Continuous Infusions:  sodium chloride     sodium chloride 1 mL/kg/hr (11/02/22 0806)   heparin Stopped (11/02/22 1438)   PRN Meds:.sodium chloride, [MAR Hold] acetaminophen **OR** [MAR Hold] acetaminophen, Heparin (Porcine) in NaCl, [MAR Hold]  morphine injection, [MAR Hold] nitroGLYCERIN, [MAR Hold] nitroGLYCERIN, [MAR Hold] ondansetron **OR** [MAR Hold] ondansetron (ZOFRAN) IV, [MAR Hold] oxyCODONE, sodium chloride flush  Antimicrobials: Anti-infectives (From admission, onward)    None        I have personally reviewed the following labs and images: CBC: Recent Labs  Lab 10/31/22 0116 11/01/22 0435  WBC 9.2 8.7  NEUTROABS 5.7 5.7  HGB 16.7 15.5  HCT 52.6* 50.3  MCV 91.8 92.1  PLT 221 206   BMP &GFR Recent Labs  Lab 10/31/22 0116 11/01/22 0435 11/02/22 0330  NA 141 139 139  K 4.0 3.8 3.6  CL 104 100 97*  CO2 29 32 34*  GLUCOSE 145* 133* 112*  BUN 17 23 30*  CREATININE 0.87 1.08 1.32*  CALCIUM 9.1 8.9 9.2  MG  --  2.0  --    Estimated Creatinine Clearance: 57.7 mL/min (A) (by C-G formula based on SCr of 1.32 mg/dL (H)). Liver & Pancreas: Recent Labs  Lab 10/31/22 0116 11/01/22 0435  AST 19 17  ALT 13 16  ALKPHOS 80 77  BILITOT 0.5 1.3*  PROT 7.3 6.5  ALBUMIN 3.7 3.4*   Recent Labs  Lab  10/31/22 0116  LIPASE 88*   No results for input(s): "AMMONIA" in the last 168 hours. Diabetic: Recent Labs    10/31/22 0116  HGBA1C 7.5*   Recent Labs  Lab 11/01/22 1657 11/01/22 2131 11/02/22 0737 11/02/22 1135 11/02/22 1430  GLUCAP 154* 151* 120* 147* 129*   Cardiac Enzymes: No results for input(s): "CKTOTAL", "CKMB", "CKMBINDEX", "TROPONINI" in the last 168 hours. No results for input(s): "PROBNP" in the last 8760 hours. Coagulation Profile: Recent Labs  Lab 10/31/22 1841  INR 1.3*   Thyroid Function Tests: No results for input(s): "TSH", "T4TOTAL", "FREET4", "T3FREE", "THYROIDAB" in the last 72 hours. Lipid Profile: No results for input(s): "CHOL", "HDL", "LDLCALC", "TRIG", "CHOLHDL", "LDLDIRECT" in the last 72 hours. Anemia Panel: No results for input(s): "VITAMINB12", "FOLATE", "FERRITIN", "TIBC", "IRON", "RETICCTPCT" in the last 72 hours. Urine analysis:    Component  Value Date/Time   COLORURINE YELLOW 08/07/2022 0114   APPEARANCEUR CLEAR 08/07/2022 0114   APPEARANCEUR Clear 05/25/2019 1554   LABSPEC 1.025 08/07/2022 0114   LABSPEC 1.012 03/07/2015 2143   PHURINE 5.5 08/07/2022 0114   GLUCOSEU 100 (A) 08/07/2022 0114   GLUCOSEU Negative 03/07/2015 2143   HGBUR TRACE (A) 08/07/2022 0114   BILIRUBINUR NEGATIVE 08/07/2022 0114   BILIRUBINUR Negative 05/25/2019 1554   BILIRUBINUR Negative 03/07/2015 2143   KETONESUR NEGATIVE 08/07/2022 0114   PROTEINUR NEGATIVE 08/07/2022 0114   UROBILINOGEN 0.2 03/26/2017 0907   NITRITE NEGATIVE 08/07/2022 0114   LEUKOCYTESUR NEGATIVE 08/07/2022 0114   LEUKOCYTESUR Negative 03/07/2015 2143   Sepsis Labs: Invalid input(s): "PROCALCITONIN", "LACTICIDVEN"  Microbiology: Recent Results (from the past 240 hour(s))  Resp panel by RT-PCR (RSV, Flu A&B, Covid) Anterior Nasal Swab     Status: None   Collection Time: 10/31/22  1:18 AM   Specimen: Anterior Nasal Swab  Result Value Ref Range Status   SARS Coronavirus 2 by RT  PCR NEGATIVE NEGATIVE Final    Comment: (NOTE) SARS-CoV-2 target nucleic acids are NOT DETECTED.  The SARS-CoV-2 RNA is generally detectable in upper respiratory specimens during the acute phase of infection. The lowest concentration of SARS-CoV-2 viral copies this assay can detect is 138 copies/mL. A negative result does not preclude SARS-Cov-2 infection and should not be used as the sole basis for treatment or other patient management decisions. A negative result may occur with  improper specimen collection/handling, submission of specimen other than nasopharyngeal swab, presence of viral mutation(s) within the areas targeted by this assay, and inadequate number of viral copies(<138 copies/mL). A negative result must be combined with clinical observations, patient history, and epidemiological information. The expected result is Negative.  Fact Sheet for Patients:  EntrepreneurPulse.com.au  Fact Sheet for Healthcare Providers:  IncredibleEmployment.be  This test is no t yet approved or cleared by the [place] FDA and  has been authorized for detection and/or diagnosis of SARS-CoV-2 by FDA under an Emergency Use Authorization (EUA). This EUA will remain  in effect (meaning this test can be used) for the duration of the COVID-19 declaration under Section 564(b)(1) of the Act, 21 U.S.C.section 360bbb-3(b)(1), unless the authorization is terminated  or revoked sooner.       Influenza A by PCR NEGATIVE NEGATIVE Final   Influenza B by PCR NEGATIVE NEGATIVE Final    Comment: (NOTE) The Xpert Xpress SARS-CoV-2/FLU/RSV plus assay is intended as an aid in the diagnosis of influenza from Nasopharyngeal swab specimens and should not be used as a sole basis for treatment. Nasal washings and aspirates are unacceptable for Xpert Xpress SARS-CoV-2/FLU/RSV testing.  Fact Sheet for Patients: EntrepreneurPulse.com.au  Fact Sheet for  Healthcare Providers: IncredibleEmployment.be  This test is not yet approved or cleared by the [place] FDA and has been authorized for detection and/or diagnosis of SARS-CoV-2 by FDA under an Emergency Use Authorization (EUA). This EUA will remain in effect (meaning this test can be used) for the duration of the COVID-19 declaration under Section 564(b)(1) of the Act, 21 U.S.C. section 360bbb-3(b)(1), unless the authorization is terminated or revoked.     Resp Syncytial Virus by PCR NEGATIVE NEGATIVE Final    Comment: (NOTE) Fact Sheet for Patients: EntrepreneurPulse.com.au  Fact Sheet for Healthcare Providers: IncredibleEmployment.be  This test is not yet approved or cleared by the [place] FDA and has been authorized for detection and/or diagnosis of SARS-CoV-2 by FDA under an Emergency Use Authorization (EUA). This  EUA will remain in effect (meaning this test can be used) for the duration of the COVID-19 declaration under Section 564(b)(1) of the Act, 21 U.S.C. section 360bbb-3(b)(1), unless the authorization is terminated or revoked.  Performed at [facility], [address] [place], [place] [address]     Radiology Studies: ECHOCARDIOGRAM COMPLETE  Result Date: 11/02/2022    ECHOCARDIOGRAM REPORT   Patient Name:   WILLET SCHLEIFER [facility] Date of Exam: 11/02/2022 Medical Rec #:  [id number]       Height:       67.0 in Accession #:    [id number]      Weight:       201.0 lb Date of Birth:  10-07-1956       BSA:          2.027 m Patient Age:    41 years        BP:           118/60 mmHg Patient Gender: M               HR:           56 bpm. Exam Location:  [facility] Procedure: 2D Echo, Cardiac Doppler and Color Doppler Indications:     Dyspnea R06.00                  Chest pain R07.9  History:         Patient has prior history of Echocardiogram examinations, most                  recent 05/04/2021. Previous Myocardial  Infarction, Prior CABG,                  COPD; Arrythmias:Atrial Fibrillation.  Sonographer:     Sherrie Sport Referring Phys:  [id number] George Hugh Diagnosing Phys: Kathlyn Sacramento MD  Sonographer Comments: Image quality was fair. IMPRESSIONS  1. Left ventricular ejection fraction, by estimation, is 50 to 55%. The left ventricle has low normal function. Left ventricular endocardial border not optimally defined to evaluate regional wall motion. There is mild left ventricular hypertrophy. Left ventricular diastolic parameters are indeterminate.  2. Right ventricular systolic function is normal. The right ventricular size is normal. Tricuspid regurgitation signal is inadequate for assessing PA pressure.  3. Left atrial size was moderately dilated.  4. The mitral valve is normal in structure. Trivial mitral valve regurgitation. No evidence of mitral stenosis.  5. The aortic valve is normal in structure. Aortic valve regurgitation is trivial. Aortic valve sclerosis is present, with no evidence of aortic valve stenosis. FINDINGS  Left Ventricle: Left ventricular ejection fraction, by estimation, is 50 to 55%. The left ventricle has low normal function. Left ventricular endocardial border not optimally defined to evaluate regional wall motion. The left ventricular internal cavity  size was normal in size. There is mild left ventricular hypertrophy. Left ventricular diastolic parameters are indeterminate. Right Ventricle: The right ventricular size is normal. No increase in right ventricular wall thickness. Right ventricular systolic function is normal. Tricuspid regurgitation signal is inadequate for assessing PA pressure. Left Atrium: Left atrial size was moderately dilated. Right Atrium: Right atrial size was normal in size. Pericardium: There is no evidence of pericardial effusion. Mitral Valve: The mitral valve is normal in structure. Trivial mitral valve regurgitation. No evidence of mitral valve stenosis. Tricuspid  Valve: The tricuspid valve is normal in structure. Tricuspid valve regurgitation is trivial. No evidence of tricuspid stenosis. Aortic Valve: The aortic valve  is normal in structure. Aortic valve regurgitation is trivial. Aortic valve sclerosis is present, with no evidence of aortic valve stenosis. Aortic valve mean gradient measures 4.0 mmHg. Aortic valve peak gradient measures 7.4 mmHg. Aortic valve area, by VTI measures 3.42 cm. Pulmonic Valve: The pulmonic valve was normal in structure. Pulmonic valve regurgitation is mild. No evidence of pulmonic stenosis. Aorta: The aortic root is normal in size and structure. Venous: The inferior vena cava was not well visualized. IAS/Shunts: No atrial level shunt detected by color flow Doppler.  LEFT VENTRICLE PLAX 2D LVIDd:         4.20 cm LVIDs:         2.90 cm LV PW:         1.20 cm LV IVS:        1.10 cm LVOT diam:     2.00 cm LV SV:         77 LV SV Index:   38 LVOT Area:     3.14 cm  RIGHT VENTRICLE RV Basal diam:  3.90 cm RV Mid diam:    2.70 cm RV S prime:     10.70 cm/s TAPSE (M-mode): 1.6 cm LEFT ATRIUM             Index        RIGHT ATRIUM           Index LA diam:        4.80 cm 2.37 cm/m   RA Area:     17.20 cm LA Vol (A2C):   76.6 ml 37.80 ml/m  RA Volume:   42.50 ml  20.97 ml/m LA Vol (A4C):   86.7 ml 42.78 ml/m LA Biplane Vol: 85.7 ml 42.29 ml/m  AORTIC VALVE AV Area (Vmax):    2.86 cm AV Area (Vmean):   2.49 cm AV Area (VTI):     3.42 cm AV Vmax:           136.00 cm/s AV Vmean:          95.100 cm/s AV VTI:            0.225 m AV Peak Grad:      7.4 mmHg AV Mean Grad:      4.0 mmHg LVOT Vmax:         124.00 cm/s LVOT Vmean:        75.300 cm/s LVOT VTI:          0.245 m LVOT/AV VTI ratio: 1.09  AORTA Ao Root diam: 3.30 cm MITRAL VALVE                TRICUSPID VALVE MV Area (PHT): 4.01 cm     TR Peak grad:   21.3 mmHg MV Decel Time: 189 msec     TR Vmax:        231.00 cm/s MV E velocity: 113.00 cm/s                             SHUNTS                              Systemic VTI:  0.24 m                             Systemic Diam: 2.00 cm Kathlyn Sacramento MD Electronically signed by Kathlyn Sacramento MD Signature Date/Time: 11/02/2022/1:53:47  PM    Final       Avy[place]t [address] [place]  If 7PM-7AM, please contact night-coverage [email or web address] 11/02/2022, 3:43 PM

## 2022-11-03 ENCOUNTER — Other Ambulatory Visit: Payer: Self-pay | Admitting: Family Medicine

## 2022-11-03 DIAGNOSIS — R0789 Other chest pain: Secondary | ICD-10-CM

## 2022-11-03 DIAGNOSIS — M5410 Radiculopathy, site unspecified: Secondary | ICD-10-CM

## 2022-11-03 LAB — RENAL FUNCTION PANEL
Albumin: 3.4 g/dL — ABNORMAL LOW (ref 3.5–5.0)
Anion gap: 6 (ref 5–15)
BUN: 22 mg/dL (ref 8–23)
CO2: 33 mmol/L — ABNORMAL HIGH (ref 22–32)
Calcium: 9.2 mg/dL (ref 8.9–10.3)
Chloride: 102 mmol/L (ref 98–111)
Creatinine, Ser: 0.9 mg/dL (ref 0.61–1.24)
GFR, Estimated: >60 mL/min (ref >60)
Glucose, Bld: 106 mg/dL — ABNORMAL HIGH (ref 70–99)
Phosphorus: 2.9 mg/dL (ref 2.5–4.6)
Potassium: 3.7 mmol/L (ref 3.5–5.1)
Sodium: 141 mmol/L (ref 135–145)

## 2022-11-03 LAB — APTT: aPTT: 33 seconds (ref 24–36)

## 2022-11-03 LAB — MAGNESIUM: Magnesium: 2.2 mg/dL (ref 1.7–2.4)

## 2022-11-03 LAB — HEPARIN LEVEL (UNFRACTIONATED): Heparin Unfractionated: <0.1 IU/mL — ABNORMAL LOW (ref 0.30–0.70)

## 2022-11-03 LAB — GLUCOSE, CAPILLARY: Glucose-Capillary: 131 mg/dL — ABNORMAL HIGH (ref 70–99)

## 2022-11-03 MED ORDER — APIXABAN 5 MG PO TABS
5.0000 mg | ORAL_TABLET | Freq: Two times a day (BID) | ORAL | Status: DC
  Administered 2022-11-03: 5 mg via ORAL
  Filled 2022-11-03: qty 1

## 2022-11-03 MED ORDER — ISOSORBIDE MONONITRATE ER 60 MG PO TB24: 60.0000 mg | ORAL_TABLET | Freq: Every day | ORAL | Status: DC

## 2022-11-03 MED ORDER — CELECOXIB 200 MG PO CAPS: 200.0000 mg | ORAL_CAPSULE | Freq: Two times a day (BID) | ORAL | Status: DC | PRN

## 2022-11-03 MED ORDER — METOPROLOL SUCCINATE ER 50 MG PO TB24
50.0000 mg | ORAL_TABLET | Freq: Every day | ORAL | Status: DC
  Administered 2022-11-03: 50 mg via ORAL
  Filled 2022-11-03: qty 1

## 2022-11-03 MED ORDER — ATORVASTATIN CALCIUM 80 MG PO TABS: 80.0000 mg | ORAL_TABLET | Freq: Every day | ORAL | Status: DC

## 2022-11-03 MED ORDER — LISINOPRIL 10 MG PO TABS
10.0000 mg | ORAL_TABLET | Freq: Every day | ORAL | Status: DC
  Administered 2022-11-03: 10 mg via ORAL
  Filled 2022-11-03: qty 1

## 2022-11-03 NOTE — Plan of Care (Signed)
  Problem: Education: Goal: Knowledge of General Education information will improve Description: Including pain rating scale, medication(s)/side effects and non-pharmacologic comfort measures Outcome: Adequate for Discharge   Problem: Health Behavior/Discharge Planning: Goal: Ability to manage health-related needs will improve Outcome: Adequate for Discharge   Problem: Clinical Measurements: Goal: Ability to maintain clinical measurements within normal limits will improve Outcome: Adequate for Discharge Goal: Will remain free from infection Outcome: Adequate for Discharge Goal: Diagnostic test results will improve Outcome: Adequate for Discharge Goal: Respiratory complications will improve Outcome: Adequate for Discharge Goal: Cardiovascular complication will be avoided Outcome: Adequate for Discharge   Problem: Activity: Goal: Risk for activity intolerance will decrease Outcome: Adequate for Discharge   Problem: Nutrition: Goal: Adequate nutrition will be maintained Outcome: Adequate for Discharge   Problem: Coping: Goal: Level of anxiety will decrease Outcome: Adequate for Discharge   Problem: Elimination: Goal: Will not experience complications related to bowel motility Outcome: Adequate for Discharge Goal: Will not experience complications related to urinary retention Outcome: Adequate for Discharge   Problem: Pain Managment: Goal: General experience of comfort will improve Outcome: Adequate for Discharge   Problem: Safety: Goal: Ability to remain free from injury will improve Outcome: Adequate for Discharge   Problem: Skin Integrity: Goal: Risk for impaired skin integrity will decrease Outcome: Adequate for Discharge   Problem: Education: Goal: Understanding of CV disease, CV risk reduction, and recovery process will improve Outcome: Adequate for Discharge Goal: Individualized Educational Video(s) Outcome: Adequate for Discharge   Problem:  Activity: Goal: Ability to return to baseline activity level will improve Outcome: Adequate for Discharge   Problem: Cardiovascular: Goal: Ability to achieve and maintain adequate cardiovascular perfusion will improve Outcome: Adequate for Discharge Goal: Vascular access site(s) Level 0-1 will be maintained Outcome: Adequate for Discharge   Problem: Health Behavior/Discharge Planning: Goal: Ability to safely manage health-related needs after discharge will improve Outcome: Adequate for Discharge

## 2022-11-03 NOTE — Progress Notes (Signed)
Order to discharge pt home.  Discharge instructions/AVS given to patient and reviewed - education provided as needed.  Pt advised to call PCP and/or come back to the hospital if there are any problems. Pt verbalized understanding.    Per MD order, lisinopril, metoprolol, and eliquis administered prior to discharge.

## 2022-11-03 NOTE — TOC Initial Note (Addendum)
Transition of Care [facility]) - Initial/Assessment Note    Patient Details  Name: Lawrence [name] MRN: [id number] Date of Birth: November 19, 1955  [facility] CM/SW Contact:    Laurena Slimmer, RN Phone Number: 11/03/2022, 10:34 AM  Clinical Narrative:                  Admitted for: Chest pain Admitted from: Home alone PCP: Dr. [name] Pharmacy: [facility] Current home health/prior home health/DME: Home oxygen Transportation: Son    Patient was advised to have his son bring his home oxygen for discharge. He denied any questions about discharge or additional needs.        Patient Goals and CMS Choice        Expected Discharge Plan and Services           Expected Discharge Date: 11/03/22                                    Prior Living Arrangements/Services                       Activities of Daily Living Home Assistive Devices/Equipment: Oxygen ADL Screening (condition at time of admission) Patient's cognitive ability adequate to safely complete daily activities?: Yes Is the patient deaf or have difficulty hearing?: No Does the patient have difficulty seeing, even when wearing glasses/contacts?: No Does the patient have difficulty concentrating, remembering, or making decisions?: No Patient able to express need for assistance with ADLs?: Yes Does the patient have difficulty dressing or bathing?: No Independently performs ADLs?: Yes (appropriate for developmental age) Does the patient have difficulty walking or climbing stairs?: No Weakness of Legs: None Weakness of Arms/Hands: None  Permission Sought/Granted                  Emotional Assessment              Admission diagnosis:  Unstable angina ([place]) [I20.0] Chest pain [R07.9] Chest pain, unspecified type [R07.9] Hypertension, unspecified type [I10] Patient Active Problem List   Diagnosis Date Noted   Atrial fibrillation, chronic ([place]) 08/07/2022    Type 2 diabetes mellitus without complications ([place]) 38/75/6433   Hx of CABG    Atrial fibrillation with RVR ([place]) 06/28/2022   Essential hypertension 06/28/2022   Dyslipidemia 06/28/2022   Hypokalemia 06/28/2022   Acute bronchitis 06/28/2022   Chronic diastolic CHF (congestive heart failure) ([place])    Atherosclerosis of aorta ([place]) 12/16/2020   Syncope 12/11/2020   Polycythemia 10/05/2020   Primary insomnia 05/13/2020   Recurrent major depressive disorder, remission status unspecified ([place]) 03/29/2020   Chronic respiratory failure with hypoxia ([place]) 05/29/2019   Trigger thumb of right hand 11/28/2018   History of adenomatous polyp of colon 04/05/2018   CKD (chronic kidney disease) stage 2, GFR 60-89 ml/min 04/03/2016   Anemia 04/03/2016   Atrial fibrillation ([place]) 12/23/2015   OSA (obstructive sleep apnea) 12/10/2015   Left inguinal hernia 11/07/2015   Anxiety 11/07/2015   Back pain with left-sided radiculopathy 09/30/2015   Nocturnal hypoxia 09/06/2015   BPH (benign prostatic hyperplasia) 08/01/2015   Acute on chronic systolic CHF (congestive heart failure) (HCC)    Anginal pain, suspect stable angina ([place])    Chest pain 07/11/2015   Chronic obstructive pulmonary disease (COPD) ([place]) 07/03/2015   COPD exacerbation ([place]) 06/26/2015   CAD (coronary artery disease) 06/26/2015  Primary hypertension 06/26/2015   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[place]) 06/26/2015   Achalasia 07/24/2014   GERD (gastroesophageal reflux disease) 06/07/2014   Former tobacco use 04/11/2013   HLD (hyperlipidemia) 04/09/2013   PCP:  Birdie Sons, MD Pharmacy:   [facility], [place] [address] [place] [address] Phone: [phone] Fax: [phone]  [facility] [place], [place] [address] Phone: [phone] Fax: [phone]  [facility] [place], [address] [place] [address] Phone: [phone] Fax: [phone]     Social Determinants of Health (SDOH) Interventions    Readmission Risk Interventions     No data to display

## 2022-11-03 NOTE — Discharge Summary (Signed)
Physician Discharge Summary   Lawrence [name]  male DOB: [birth year]  UPJ:[id number]  PCP: Birdie Sons, MD  Admit date: 10/31/2022 Discharge date: 11/03/2022  Admitted From: home Disposition:  home CODE STATUS: Full code  Discharge Instructions     AMB Referral to Cardiac Rehabilitation - Phase II   Complete by: As directed    Diagnosis: Stable Angina   After initial evaluation and assessments completed: Virtual Based Care may be provided alone or in conjunction with Phase 2 Cardiac Rehab based on patient barriers.: Yes   Intensive Cardiac Rehabilitation (ICR) [place] location only OR Traditional Cardiac Rehabilitation (TCR) *If criteria for ICR are not met will enroll in TCR [facility] only): Yes   Diet - low sodium heart healthy   Complete by: As directed       Hospital Course:  For full details, please see H&P, progress notes, consult notes and ancillary notes.  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 He returned to ED with recurrent chest pain.   Atypical chest pain, ACS ruled out EKG is unchanged. Troponins are wnl x 2.  Started on heparin.  Cardiology consulted.  Left heart catheterization found no new obstruction to account for his chest pain.  Chest pain most likely MSK.  Pt advised to try heat pad, muscle rubs or massages.    Persistent A-fib: Rate controlled.   -Continue amiodarone 200 mg twice daily -Cardizem and metoprolol on hold due to hypotension and bradycardia.  BP became elevated prior to discharge, so both resumed.   Hypotension: Resolved. BP became elevated prior to discharge, so home BP meds resumed prior to discharge.   Bradycardia: Likely from nodal blocking agent.  Resolved.   Uncontrolled NIDDM-2 with  hyperglycemia: A1c 7.5%. --does not take insulin at home --resume home [place] and metformin after discharge.   Chronic COPD: Stable. -Continue home inhalers.   Chronic pain on chronic opioids --Has current Rx for Percocet, continue.  Chronic anxiety on chronic benzo --Has current Rx for alprazolam, continue.   Obesity Body mass index is 31.48 kg/m.   Unless noted above, medications under "STOP" list are ones pt was not taking PTA.  Discharge Diagnoses:  Principal Problem:   Chest pain Active Problems:   CAD (coronary artery disease)   GERD (gastroesophageal reflux disease)   HLD (hyperlipidemia)   Atrial fibrillation (HCC)   Type 2 diabetes mellitus without complications ([place])   30 Day Unplanned Readmission Risk Score    Flowsheet Row ED to Hosp-Admission (Current) from 10/31/2022 in [facility]  30 Day Unplanned Readmission Risk Score (%) 30.21 Filed at 11/03/2022 0801       This score is the patient's risk of an unplanned readmission within 30 days of being discharged (0 -100%). The score is based on dignosis, age, lab data, medications, orders, and past utilization.   Low:  0-14.9   Medium: 15-21.9   High: 22-29.9   Extreme: 30 and above         Discharge Instructions:  Allergies as of 11/03/2022       Reactions   Prednisone Other (See Comments), Hypertension   Pt states that this medication puts him in A-fib  Demerol  [meperidine Hcl]    Demerol [meperidine] Hives   Jardiance [empagliflozin] Other (See Comments)   Perineal pain   Sulfa Antibiotics Hives   Albuterol Sulfate [albuterol] Palpitations, Other (See Comments)   Pt currently uses this medication.     Morphine Sulfate Nausea And Vomiting, Rash, Other (See Comments)   Pt states that he is only allergic to the tablet form of this medication.          Medication List     STOP taking these medications    docusate sodium 100 MG capsule Commonly known as: COLACE    ondansetron 4 MG disintegrating tablet Commonly known as: ZOFRAN-ODT       TAKE these medications    Accu-Chek Guide Control Liqd Use with blood glucose monitor as directed   Accu-Chek Guide test strip Generic drug: glucose blood Use as instructed to check sugar daily for type 2 diabetes.   Accu-Chek Guide w/Device Kit Use to check blood sugars as directed   Accu-Chek Softclix Lancets lancets Use as instructed to check sugar daily for type 2 diabetes.   acetaminophen 325 MG tablet Commonly known as: TYLENOL Take 2 tablets (650 mg total) by mouth every 4 (four) hours as needed for headache or mild pain.   albuterol 108 (90 Base) MCG/ACT inhaler Commonly known as: VENTOLIN HFA INHALE 2 PUFFS BY MOUTH EVERY 6 HOURS AS NEEDED FOR SHORTNESS OF BREATH   allopurinol 300 MG tablet Commonly known as: ZYLOPRIM TAKE 1 TABLET BY MOUTH TWICE A DAY   ALPRAZolam 1 MG tablet Commonly known as: XANAX Take 1 tablet (1 mg total) by mouth 3 (three) times daily.   amiodarone 200 MG tablet Commonly known as: PACERONE Take 200 mg by mouth 2 (two) times daily.   aspirin 81 MG chewable tablet Chew 81 mg by mouth daily. Swallows whole   atorvastatin 80 MG tablet Commonly known as: LIPITOR Take 1 tablet (80 mg total) by mouth daily. Home med. What changed:  when to take this additional instructions   Belsomra 5 MG Tabs Generic drug: Suvorexant TAKE 1 TABLET (5 MG TOTAL) BY MOUTH AT BEDTIME AS NEEDED What changed: See the new instructions.   Breo Ellipta 100-25 MCG/ACT Aepb Generic drug: fluticasone furoate-vilanterol INHALE 1 PUFF BY MOUTH INTO THE LUNGS DAILY.   celecoxib 200 MG capsule Commonly known as: CELEBREX Take 1 capsule (200 mg total) by mouth 2 (two) times daily as needed. Home med. What changed:  when to take this reasons to take this additional instructions   cetirizine 10 MG tablet Commonly known as: ZYRTEC TAKE 1 TABLET BY MOUTH AT BEDTIME   diltiazem 180  MG 24 hr capsule Commonly known as: CARDIZEM CD Take 1 capsule (180 mg total) by mouth daily.   Eliquis 5 MG Tabs tablet Generic drug: apixaban TAKE 1 TABLET BY MOUTH TWICE A DAY What changed: how much to take   Farxiga 10 MG Tabs tablet Generic drug: dapagliflozin propanediol TAKE 1 TABLET BY MOUTH DAILY What changed:  how much to take additional instructions   isosorbide mononitrate 60 MG 24 hr tablet Commonly known as: IMDUR Take 1-2 tablets (60-120 mg total) by mouth daily. Home med. What changed:  how much to take additional instructions   linaclotide 72 MCG capsule Commonly known as: LINZESS Take 72 mcg by mouth daily before breakfast.   lisinopril 10 MG tablet Commonly known as: ZESTRIL TAKE 1 TABLET BY MOUTH DAILY   meclizine 25 MG tablet Commonly known as:  ANTIVERT TAKE ONE TABLET BY THREE TIMES A DAY AS NEEDED FOR DIZZINESS What changed: See the new instructions.   metFORMIN 500 MG 24 hr tablet Commonly known as: GLUCOPHAGE-XR TAKE 1 TABLET BY MOUTH TWICE A DAY What changed: when to take this   methocarbamol 500 MG tablet Commonly known as: ROBAXIN Take 500 mg by mouth every 8 (eight) hours as needed for muscle spasms. Take 1 tablet by mouth every hour as needed for muscle spasms   metoprolol succinate 50 MG 24 hr tablet Commonly known as: TOPROL-XL Take 50 mg by mouth daily.   naloxone 4 MG/0.1ML Liqd nasal spray kit Commonly known as: NARCAN 1 spray into nostril x1 and may repeat every 2-3 minutes until patient is responsive or EMS arrives   nitroGLYCERIN 0.4 MG SL tablet Commonly known as: NITROSTAT Place 1 tablet (0.4 mg total) under the tongue every 5 (five) minutes as needed for chest pain.   omega-3 acid ethyl esters 1 g capsule Commonly known as: LOVAZA Take 4 capsules (4 g total) by mouth daily.   omeprazole 40 MG capsule Commonly known as: PRILOSEC TAKE 1 CAPSULE BY MOUTH 2 TIMES DAILY 30 MINUTES BEFORE BREAKFAST AND DINNER    oxyCODONE-acetaminophen 10-325 MG tablet Commonly known as: PERCOCET TAKE 1 TABLET BY MOUTH EVERY 4 HOURS AS NEEDED FOR PAIN.   ranolazine 1000 MG SR tablet Commonly known as: RANEXA Take 1 tablet (1,000 mg total) by mouth 2 (two) times daily.   Spiriva HandiHaler 18 MCG inhalation capsule Generic drug: tiotropium PLACE 1 CAPSULE INTO INHALER AND INHALE BY MOUTH ONCE A DAY.   torsemide 100 MG tablet Commonly known as: DEMADEX Take 0.5 tablets (50 mg total) by mouth daily.   traZODone 150 MG tablet Commonly known as: DESYREL TAKE 1 TABLET BY MOUTH AT BEDTIME   venlafaxine XR 75 MG 24 hr capsule Commonly known as: EFFEXOR-XR TAKE 1 CAPSULE BY MOUTH DAILY WITH BREAKFAST         Follow-up Information     Birdie Sons, MD Follow up in 1 week(s).   Specialty: Family Medicine Contact information: [address] [place] [address] [phone]                 Allergies  Allergen Reactions   Prednisone Other (See Comments) and Hypertension    Pt states that this medication puts him in A-fib    Demerol  [Meperidine Hcl]    Demerol [Meperidine] Hives   Jardiance [Empagliflozin] Other (See Comments)    Perineal pain   Sulfa Antibiotics Hives   Albuterol Sulfate [Albuterol] Palpitations and Other (See Comments)    Pt currently uses this medication.     Morphine Sulfate Nausea And Vomiting, Rash and Other (See Comments)    Pt states that he is only allergic to the tablet form of this medication.       The results of significant diagnostics from this hospitalization (including imaging, microbiology, ancillary and laboratory) are listed below for reference.   Consultations:   Procedures/Studies: ECHOCARDIOGRAM COMPLETE  Result Date: 11/02/2022    ECHOCARDIOGRAM REPORT   Patient Name:   ZALMEN WRIGHTSMAN [facility] Date of Exam: 11/02/2022 Medical Rec #:  [id number]       Height:       67.0 in Accession #:    [id number]      Weight:       201.0 lb Date of  Birth:  [birth year]       BSA:  2.027 m Patient Age:    50 years        BP:           118/60 mmHg Patient Gender: M               HR:           56 bpm. Exam Location:  [facility] Procedure: 2D Echo, Cardiac Doppler and Color Doppler Indications:     Dyspnea R06.00                  Chest pain R07.9  History:         Patient has prior history of Echocardiogram examinations, most                  recent 05/04/2021. Previous Myocardial Infarction, Prior CABG,                  COPD; Arrythmias:Atrial Fibrillation.  Sonographer:     Sherrie Sport Referring Phys:  [id number] George Hugh Diagnosing Phys: Kathlyn Sacramento MD  Sonographer Comments: Image quality was fair. IMPRESSIONS  1. Left ventricular ejection fraction, by estimation, is 50 to 55%. The left ventricle has low normal function. Left ventricular endocardial border not optimally defined to evaluate regional wall motion. There is mild left ventricular hypertrophy. Left ventricular diastolic parameters are indeterminate.  2. Right ventricular systolic function is normal. The right ventricular size is normal. Tricuspid regurgitation signal is inadequate for assessing PA pressure.  3. Left atrial size was moderately dilated.  4. The mitral valve is normal in structure. Trivial mitral valve regurgitation. No evidence of mitral stenosis.  5. The aortic valve is normal in structure. Aortic valve regurgitation is trivial. Aortic valve sclerosis is present, with no evidence of aortic valve stenosis. FINDINGS  Left Ventricle: Left ventricular ejection fraction, by estimation, is 50 to 55%. The left ventricle has low normal function. Left ventricular endocardial border not optimally defined to evaluate regional wall motion. The left ventricular internal cavity  size was normal in size. There is mild left ventricular hypertrophy. Left ventricular diastolic parameters are indeterminate. Right Ventricle: The right ventricular size is normal. No increase in right ventricular wall  thickness. Right ventricular systolic function is normal. Tricuspid regurgitation signal is inadequate for assessing PA pressure. Left Atrium: Left atrial size was moderately dilated. Right Atrium: Right atrial size was normal in size. Pericardium: There is no evidence of pericardial effusion. Mitral Valve: The mitral valve is normal in structure. Trivial mitral valve regurgitation. No evidence of mitral valve stenosis. Tricuspid Valve: The tricuspid valve is normal in structure. Tricuspid valve regurgitation is trivial. No evidence of tricuspid stenosis. Aortic Valve: The aortic valve is normal in structure. Aortic valve regurgitation is trivial. Aortic valve sclerosis is present, with no evidence of aortic valve stenosis. Aortic valve mean gradient measures 4.0 mmHg. Aortic valve peak gradient measures 7.4 mmHg. Aortic valve area, by VTI measures 3.42 cm. Pulmonic Valve: The pulmonic valve was normal in structure. Pulmonic valve regurgitation is mild. No evidence of pulmonic stenosis. Aorta: The aortic root is normal in size and structure. Venous: The inferior vena cava was not well visualized. IAS/Shunts: No atrial level shunt detected by color flow Doppler.  LEFT VENTRICLE PLAX 2D LVIDd:         4.20 cm LVIDs:         2.90 cm LV PW:         1.20 cm LV IVS:        1.10  cm LVOT diam:     2.00 cm LV SV:         77 LV SV Index:   38 LVOT Area:     3.14 cm  RIGHT VENTRICLE RV Basal diam:  3.90 cm RV Mid diam:    2.70 cm RV S prime:     10.70 cm/s TAPSE (M-mode): 1.6 cm LEFT ATRIUM             Index        RIGHT ATRIUM           Index LA diam:        4.80 cm 2.37 cm/m   RA Area:     17.20 cm LA Vol (A2C):   76.6 ml 37.80 ml/m  RA Volume:   42.50 ml  20.97 ml/m LA Vol (A4C):   86.7 ml 42.78 ml/m LA Biplane Vol: 85.7 ml 42.29 ml/m  AORTIC VALVE AV Area (Vmax):    2.86 cm AV Area (Vmean):   2.49 cm AV Area (VTI):     3.42 cm AV Vmax:           136.00 cm/s AV Vmean:          95.100 cm/s AV VTI:            0.225  m AV Peak Grad:      7.4 mmHg AV Mean Grad:      4.0 mmHg LVOT Vmax:         124.00 cm/s LVOT Vmean:        75.300 cm/s LVOT VTI:          0.245 m LVOT/AV VTI ratio: 1.09  AORTA Ao Root diam: 3.30 cm MITRAL VALVE                TRICUSPID VALVE MV Area (PHT): 4.01 cm     TR Peak grad:   21.3 mmHg MV Decel Time: 189 msec     TR Vmax:        231.00 cm/s MV E velocity: 113.00 cm/s                             SHUNTS                             Systemic VTI:  0.24 m                             Systemic Diam: 2.00 cm Kathlyn Sacramento MD Electronically signed by Kathlyn Sacramento MD Signature Date/Time: 11/02/2022/1:53:47 PM    Final    DG Chest Port 1 View  Result Date: 10/31/2022 CLINICAL DATA:  Chest pain EXAM: PORTABLE CHEST 1 VIEW COMPARISON:  Chest x-ray 08/07/2022 FINDINGS: Heart is enlarged, unchanged. There central pulmonary vascular congestion. There some interstitial opacities in the left lung base. There is no lung consolidation, pleural effusion or pneumothorax. Patient is status post cardiac surgery. No fractures are identified. IMPRESSION: 1. Cardiomegaly with central pulmonary vascular congestion. 2. Interstitial opacities in the left lung base, likely due to edema. Electronically Signed   By: Ronney Asters M.D.   On: 10/31/2022 01:51      Labs: BNP (last 3 results) Recent Labs    07/15/22 1840 08/07/22 0101 10/31/22 0116  BNP 68.0 313.6* 939.0*   Basic Metabolic Panel: Recent Labs  Lab 10/31/22  0116 11/01/22 0435 11/02/22 0330 11/03/22 0405  NA 141 139 139 141  K 4.0 3.8 3.6 3.7  CL 104 100 97* 102  CO2 29 32 34* 33*  GLUCOSE 145* 133* 112* 106*  BUN 17 23 30* 22  CREATININE 0.87 1.08 1.32* 0.90  CALCIUM 9.1 8.9 9.2 9.2  MG  --  2.0  --  2.2  PHOS  --   --   --  2.9   Liver Function Tests: Recent Labs  Lab 10/31/22 0116 11/01/22 0435 11/03/22 0405  AST 19 17  --   ALT 13 16  --   ALKPHOS 80 77  --   BILITOT 0.5 1.3*  --   PROT 7.3 6.5  --   ALBUMIN 3.7 3.4* 3.4*    Recent Labs  Lab 10/31/22 0116  LIPASE 88*   No results for input(s): "AMMONIA" in the last 168 hours. CBC: Recent Labs  Lab 10/31/22 0116 11/01/22 0435  WBC 9.2 8.7  NEUTROABS 5.7 5.7  HGB 16.7 15.5  HCT 52.6* 50.3  MCV 91.8 92.1  PLT 221 206   Cardiac Enzymes: No results for input(s): "CKTOTAL", "CKMB", "CKMBINDEX", "TROPONINI" in the last 168 hours. BNP: Invalid input(s): "POCBNP" CBG: Recent Labs  Lab 11/02/22 1135 11/02/22 1430 11/02/22 1735 11/02/22 2114 11/03/22 0808  GLUCAP 147* 129* 104* 223* 131*   D-Dimer No results for input(s): "DDIMER" in the last 72 hours. Hgb A1c No results for input(s): "HGBA1C" in the last 72 hours. Lipid Profile No results for input(s): "CHOL", "HDL", "LDLCALC", "TRIG", "CHOLHDL", "LDLDIRECT" in the last 72 hours. Thyroid function studies No results for input(s): "TSH", "T4TOTAL", "T3FREE", "THYROIDAB" in the last 72 hours.  Invalid input(s): "FREET3" Anemia work up No results for input(s): "VITAMINB12", "FOLATE", "FERRITIN", "TIBC", "IRON", "RETICCTPCT" in the last 72 hours. Urinalysis    Component Value Date/Time   COLORURINE YELLOW 08/07/2022 0114   APPEARANCEUR CLEAR 08/07/2022 0114   APPEARANCEUR Clear 05/25/2019 1554   LABSPEC 1.025 08/07/2022 0114   LABSPEC 1.012 03/07/2015 2143   PHURINE 5.5 08/07/2022 0114   GLUCOSEU 100 (A) 08/07/2022 0114   GLUCOSEU Negative 03/07/2015 2143   HGBUR TRACE (A) 08/07/2022 0114   BILIRUBINUR NEGATIVE 08/07/2022 0114   BILIRUBINUR Negative 05/25/2019 1554   BILIRUBINUR Negative 03/07/2015 2143   KETONESUR NEGATIVE 08/07/2022 0114   PROTEINUR NEGATIVE 08/07/2022 0114   UROBILINOGEN 0.2 03/26/2017 0907   NITRITE NEGATIVE 08/07/2022 0114   LEUKOCYTESUR NEGATIVE 08/07/2022 0114   LEUKOCYTESUR Negative 03/07/2015 2143   Sepsis Labs Recent Labs  Lab 10/31/22 0116 11/01/22 0435  WBC 9.2 8.7   Microbiology Recent Results (from the past 240 hour(s))  Resp panel by RT-PCR  (RSV, Flu A&B, Covid) Anterior Nasal Swab     Status: None   Collection Time: 10/31/22  1:18 AM   Specimen: Anterior Nasal Swab  Result Value Ref Range Status   SARS Coronavirus 2 by RT PCR NEGATIVE NEGATIVE Final    Comment: (NOTE) SARS-CoV-2 target nucleic acids are NOT DETECTED.  The SARS-CoV-2 RNA is generally detectable in upper respiratory specimens during the acute phase of infection. The lowest concentration of SARS-CoV-2 viral copies this assay can detect is 138 copies/mL. A negative result does not preclude SARS-Cov-2 infection and should not be used as the sole basis for treatment or other patient management decisions. A negative result may occur with  improper specimen collection/handling, submission of specimen other than nasopharyngeal swab, presence of viral mutation(s) within the areas targeted by this assay,  and inadequate number of viral copies(<138 copies/mL). A negative result must be combined with clinical observations, patient history, and epidemiological information. The expected result is Negative.  Fact Sheet for Patients:  EntrepreneurPulse.com.au  Fact Sheet for Healthcare Providers:  IncredibleEmployment.be  This test is no t yet approved or cleared by the [place] FDA and  has been authorized for detection and/or diagnosis of SARS-CoV-2 by FDA under an Emergency Use Authorization (EUA). This EUA will remain  in effect (meaning this test can be used) for the duration of the COVID-19 declaration under Section 564(b)(1) of the Act, 21 U.S.C.section 360bbb-3(b)(1), unless the authorization is terminated  or revoked sooner.       Influenza A by PCR NEGATIVE NEGATIVE Final   Influenza B by PCR NEGATIVE NEGATIVE Final    Comment: (NOTE) The Xpert Xpress SARS-CoV-2/FLU/RSV plus assay is intended as an aid in the diagnosis of influenza from Nasopharyngeal swab specimens and should not be used as a sole basis for  treatment. Nasal washings and aspirates are unacceptable for Xpert Xpress SARS-CoV-2/FLU/RSV testing.  Fact Sheet for Patients: EntrepreneurPulse.com.au  Fact Sheet for Healthcare Providers: IncredibleEmployment.be  This test is not yet approved or cleared by the [place] FDA and has been authorized for detection and/or diagnosis of SARS-CoV-2 by FDA under an Emergency Use Authorization (EUA). This EUA will remain in effect (meaning this test can be used) for the duration of the COVID-19 declaration under Section 564(b)(1) of the Act, 21 U.S.C. section 360bbb-3(b)(1), unless the authorization is terminated or revoked.     Resp Syncytial Virus by PCR NEGATIVE NEGATIVE Final    Comment: (NOTE) Fact Sheet for Patients: EntrepreneurPulse.com.au  Fact Sheet for Healthcare Providers: IncredibleEmployment.be  This test is not yet approved or cleared by the [place] FDA and has been authorized for detection and/or diagnosis of SARS-CoV-2 by FDA under an Emergency Use Authorization (EUA). This EUA will remain in effect (meaning this test can be used) for the duration of the COVID-19 declaration under Section 564(b)(1) of the Act, 21 U.S.C. section 360bbb-3(b)(1), unless the authorization is terminated or revoked.  Performed at [facility], [address] [place], [place] [address]      Total time spend on discharging this patient, including the last patient exam, discussing the hospital stay, instructions for ongoing care as it relates to all pertinent caregivers, as well as preparing the medical discharge records, prescriptions, and/or referrals as applicable, is 45 minutes.    Enzo Bi, MD  [facility] 11/03/2022, 10:06 AM

## 2022-11-03 NOTE — Telephone Encounter (Signed)
Requested medication (s) are due for refill today -no  Requested medication (s) are on the active medication list -yes  Future visit scheduled -no  Last refill: 10/10/22 #180  Notes to clinic: non delegated Rx  Requested Prescriptions  Pending Prescriptions Disp Refills   oxyCODONE-acetaminophen (PERCOCET) 10-325 MG tablet [Pharmacy Med Name: OXYCODONE-APAP 10-325 MG TAB] 180 tablet     Sig: TAKE 1 TABLET BY MOUTH EVERY 4 HOURS AS NEEDED FOR PAIN.     Not Delegated - Analgesics:  Opioid Agonist Combinations Failed - 11/03/2022  1:20 PM      Failed - This refill cannot be delegated      Passed - Urine Drug Screen completed in last 360 days      Passed - Valid encounter within last 3 months    Recent Outpatient Visits           1 month ago Chronic respiratory failure with hypoxia [facility])   [facility] Mikey Kirschner, PA-C   3 months ago [place], Donald E, MD   5 months ago Toe pain, left   [facility] Tally Joe T, FNP   7 months ago Type 2 diabetes mellitus with diabetic nephropathy, without long-term current use of insulin [facility])   [facility] Birdie Sons, MD   10 months ago OSA (obstructive sleep apnea)   [place], DO                 Requested Prescriptions  Pending Prescriptions Disp Refills   oxyCODONE-acetaminophen (PERCOCET) 10-325 MG tablet [Pharmacy Med Name: OXYCODONE-APAP 10-325 MG TAB] 180 tablet     Sig: TAKE 1 TABLET BY MOUTH EVERY 4 HOURS AS NEEDED FOR PAIN.     Not Delegated - Analgesics:  Opioid Agonist Combinations Failed - 11/03/2022  1:20 PM      Failed - This refill cannot be delegated      Passed - Urine Drug Screen completed in last 360 days      Passed - Valid encounter within last 3 months    Recent Outpatient Visits           1 month ago Chronic respiratory failure with hypoxia [facility])   [facility]  Mikey Kirschner, PA-C   3 months ago [place], Donald E, MD   5 months ago Toe pain, left   [facility] Tally Joe T, FNP   7 months ago Type 2 diabetes mellitus with diabetic nephropathy, without long-term current use of insulin [facility])   [facility] Birdie Sons, MD   10 months ago OSA (obstructive sleep apnea)   [facility] Myles Gip, DO

## 2022-11-03 NOTE — Consult Note (Signed)
   [facility] Inpatient Consult   11/03/2022  JACOBUS COLVIN [birth year] [id number]  [place] Organization [ACO] Patient:  [employer] Medicare HMO/Medicaid  Primary Care Provider:  Birdie Sons, MD  Patient has a [place] Coordinator listed.  Will update on patient being [employer] SNP pla.   Review of patient's medical record for past medical history and membership affiliate roster reveals this patient is a [employer] Needs Program] member and will be followed with the [facility] Medicare assigned team member in that program.  Of note, [place] Management services does not replace or interfere with any services that are arranged by inpatient case management or social work.  For additional questions or referrals please contact:    Plan: Will sign off.    Natividad Brood, RN BSN [place]  [phone] business mobile phone Toll free office [phone]  *[place]  [phone] Fax number: [phone] [email or web address]

## 2022-11-04 ENCOUNTER — Telehealth: Payer: Self-pay | Admitting: *Deleted

## 2022-11-04 ENCOUNTER — Encounter: Payer: Self-pay | Admitting: Internal Medicine

## 2022-11-04 LAB — LIPOPROTEIN A (LPA): Lipoprotein (a): 60.3 nmol/L — ABNORMAL HIGH (ref <75.0)

## 2022-11-04 LAB — CARDIAC CATHETERIZATION: Cath EF Quantitative: 45 %

## 2022-11-04 NOTE — Patient Outreach (Signed)
  Care Coordination [facility] Note Transition Care Management Unsuccessful Follow-up Telephone Call  Date of discharge and from where:  [facility] [phone]  Attempts:  1st Attempt  Reason for unsuccessful TCM follow-up call:  Left voice message  [place] Care Management [phone]

## 2022-11-04 NOTE — Patient Outreach (Signed)
  Care Coordination [facility] Note Transition Care Management Unsuccessful Follow-up Telephone Call  Date of discharge and from where:  [facility] [phone]  Attempts:  2nd Attempt  Reason for unsuccessful TCM follow-up call:  Left voice message  [place] [facility] Management [phone]

## 2022-11-05 ENCOUNTER — Telehealth: Payer: Self-pay | Admitting: *Deleted

## 2022-11-05 NOTE — Patient Outreach (Signed)
  Care Coordination [facility] Note Transition Care Management Unsuccessful Follow-up Telephone Call  Date of discharge and from where:  [facility] [phone]  Attempts:  3rd Attempt  Reason for unsuccessful TCM follow-up call:  Left voice message  [place] [facility] [place] [facility] Management [phone]

## 2022-11-17 ENCOUNTER — Ambulatory Visit: Payer: Medicare Other

## 2022-11-17 ENCOUNTER — Telehealth: Payer: Self-pay

## 2022-11-17 ENCOUNTER — Encounter: Payer: Self-pay | Admitting: Oncology

## 2022-11-17 DIAGNOSIS — I10 Essential (primary) hypertension: Secondary | ICD-10-CM

## 2022-11-17 NOTE — Chronic Care Management (AMB) (Signed)
CCM Encounter created in error: Please disregard

## 2022-11-17 NOTE — Telephone Encounter (Signed)
   CCM RN Visit Note   11/17/22 Name: Lawrence [name] MRN: [id number]      DOB: [birth year]  Subjective: [name] is a 67 y.o. year old male who is a primary care patient of [name], [name], MD.  Mr. Schubert was recently hospitalized at [facility] from 12/16-12/19 d/t chest pain. Left heart catheterization was performed.  Brief outreach with Mr. Allinson today. Reports doing well since hospital discharge. He has not completed a PCP follow up since discharge. Declined offer to schedule appointment today. Reports he will contact the clinic to schedule when available.  Mr. Viverette was previously engaged with the CCM team. Message relayed to PCP regarding need for new CCM referral. Agreeable to outreach within the next week if referral is received.  Discussed worsening s/sx that require immediate medical attention.   PLAN: Will follow up next week   [place] Manager/Chronic Care Management [phone]

## 2022-11-19 ENCOUNTER — Other Ambulatory Visit: Payer: Self-pay | Admitting: Family Medicine

## 2022-11-23 ENCOUNTER — Telehealth: Payer: Medicare HMO

## 2022-11-24 ENCOUNTER — Other Ambulatory Visit: Payer: Self-pay | Admitting: Family Medicine

## 2022-11-24 DIAGNOSIS — G4700 Insomnia, unspecified: Secondary | ICD-10-CM

## 2022-11-24 NOTE — Telephone Encounter (Signed)
Requested medication (s) are due for refill today: expired medication  Requested medication (s) are on the active medication list: yes  Last refill:  09/01/21 #90 4 refills  Future visit scheduled: no  Notes to clinic:  expired medication do you want to renew Rx?     Requested Prescriptions  Pending Prescriptions Disp Refills   traZODone (DESYREL) 150 MG tablet [Pharmacy Med Name: TRAZODONE HCL 150 MG TAB] 90 tablet 4    Sig: TAKE 1 TABLET BY MOUTH AT BEDTIME     Psychiatry: Antidepressants - Serotonin Modulator Passed - 11/24/2022 12:40 PM      Passed - Completed PHQ-2 or PHQ-9 in the last 360 days      Passed - Valid encounter within last 6 months    Recent Outpatient Visits           2 months ago Chronic respiratory failure with hypoxia [facility])   [facility] Mikey Kirschner, PA-C   4 months ago [place], Donald E, MD   5 months ago Toe pain, left   [facility] Tally Joe T, FNP   7 months ago Type 2 diabetes mellitus with diabetic nephropathy, without long-term current use of insulin [facility])   [facility] Birdie Sons, MD   11 months ago OSA (obstructive sleep apnea)   [facility] Myles Gip, DO

## 2022-11-28 ENCOUNTER — Emergency Department: Payer: Medicare Other

## 2022-11-28 ENCOUNTER — Other Ambulatory Visit: Payer: Self-pay

## 2022-11-28 ENCOUNTER — Emergency Department
Admission: EM | Admit: 2022-11-28 | Discharge: 2022-11-29 | Disposition: A | Discharge status: Home / Self Care | Payer: Medicare Other | Attending: Emergency Medicine | Admitting: Emergency Medicine

## 2022-11-28 ENCOUNTER — Encounter: Payer: Self-pay | Admitting: Oncology

## 2022-11-28 DIAGNOSIS — I200 Unstable angina: Secondary | ICD-10-CM | POA: Insufficient documentation

## 2022-11-28 DIAGNOSIS — R079 Chest pain, unspecified: Secondary | ICD-10-CM | POA: Insufficient documentation

## 2022-11-28 DIAGNOSIS — J449 Chronic obstructive pulmonary disease, unspecified: Secondary | ICD-10-CM | POA: Insufficient documentation

## 2022-11-28 DIAGNOSIS — E1165 Type 2 diabetes mellitus with hyperglycemia: Secondary | ICD-10-CM | POA: Insufficient documentation

## 2022-11-28 LAB — CBC
HCT: 54.5 % — ABNORMAL HIGH (ref 39.0–52.0)
Hemoglobin: 17.2 g/dL — ABNORMAL HIGH (ref 13.0–17.0)
MCH: 28 pg (ref 26.0–34.0)
MCHC: 31.6 g/dL (ref 30.0–36.0)
MCV: 88.6 fL (ref 80.0–100.0)
Platelets: 229 10*3/uL (ref 150–400)
RBC: 6.15 MIL/uL — ABNORMAL HIGH (ref 4.22–5.81)
RDW: 14.5 % (ref 11.5–15.5)
WBC: 8.1 10*3/uL (ref 4.0–10.5)
nRBC: 0 % (ref 0.0–0.2)

## 2022-11-28 LAB — BASIC METABOLIC PANEL
Anion gap: 10 (ref 5–15)
BUN: 14 mg/dL (ref 8–23)
CO2: 26 mmol/L (ref 22–32)
Calcium: 9.2 mg/dL (ref 8.9–10.3)
Chloride: 102 mmol/L (ref 98–111)
Creatinine, Ser: 1.19 mg/dL (ref 0.61–1.24)
GFR, Estimated: >60 mL/min (ref >60)
Glucose, Bld: 172 mg/dL — ABNORMAL HIGH (ref 70–99)
Potassium: 3.9 mmol/L (ref 3.5–5.1)
Sodium: 138 mmol/L (ref 135–145)

## 2022-11-28 LAB — TROPONIN I (HIGH SENSITIVITY): Troponin I (High Sensitivity): 13 ng/L (ref <18)

## 2022-11-28 MED ORDER — HYDROMORPHONE HCL 1 MG/ML IJ SOLN
1.0000 mg | Freq: Once | INTRAMUSCULAR | Status: AC
  Administered 2022-11-28: 1 mg via INTRAMUSCULAR
  Filled 2022-11-28: qty 1

## 2022-11-28 NOTE — ED Provider Notes (Signed)
[facility] Provider Note    Event Date/Time   First MD Initiated Contact with Patient 11/28/22 2156     (approximate)  History   Chief Complaint: Chest Pain  HPI  Lawrence [name] is a 67 y.o. male with a past medical history of anemia, A-fib, anxiety, CAD status post multiple stents, diabetes, COPD, gastric reflux, angina, presents to the emergency department for chest pain.  Patient has a well-documented history of angina CAD.  Patient was just admitted last month I read the patient's discharge summary patient was seen by cardiology had a catheterization performed showing no new or significant lesions.  Patient states since that time he has continued to have daily chest pain over the past month or so.  States it was worse tonight so he came to the emergency department for evaluation.  Denies any current shortness of breath denies any significant nausea or diaphoresis.  Physical Exam   Triage Vital Signs: ED Triage Vitals  Enc Vitals Group     BP 11/28/22 2137 (!) 183/132     Pulse Rate 11/28/22 2135 62     Resp 11/28/22 2135 16     Temp 11/28/22 2135 97.8 F (36.6 C)     Temp Source 11/28/22 2135 Oral     SpO2 11/28/22 2135 95 %     Weight 11/28/22 2134 201 lb 1 oz (91.2 kg)     Height 11/28/22 2134 '5\' 7"'$  (1.702 m)     Head Circumference --      Peak Flow --      Pain Score 11/28/22 2134 9     Pain Loc --      Pain Edu? --      Excl. in [place]? --     Most recent vital signs: Vitals:   11/28/22 2135 11/28/22 2137  BP:  (!) 183/132  Pulse: 62   Resp: 16   Temp: 97.8 F (36.6 C)   SpO2: 95%     General: Awake, no distress.  CV:  Good peripheral perfusion.  Regular rate and rhythm  Resp:  Normal effort.  Equal breath sounds bilaterally.  Abd:  No distention.  Soft, nontender.  No rebound or guarding.   ED Results / Procedures / Treatments   EKG  EKG viewed and interpreted by myself shows atrial fibrillation 70 bpm with a narrow QRS,  normal axis, normal intervals, no concerning ST changes.  RADIOLOGY  Chest x-ray viewed and interpreted by myself, I do not see any obvious consolidation on my evaluation. Radiology shows no acute process.  Old CABG.   MEDICATIONS ORDERED IN ED: Medications - No data to display   IMPRESSION / MDM / [place] / ED COURSE  I reviewed the triage vital signs and the nursing notes.  Patient's presentation is most consistent with acute presentation with potential threat to life or bodily function.  Patient presents emergency department for chest pain.  Patient has a longstanding history of angina.  I read the patient's discharge summary from last month at which time the patient had a cardiac catheterization showing no new or significant lesion.  Patient states he has had ongoing chest pain for at least the past month.  Has not followed up with his cardiologist.  Patient states he took 3 nitroglycerin at home but it did not help so he came to the emergency department.  Here patient continues to state central chest pain.  Patient's lab work so far is reassuring with  an overall reassuring chemistry with just mild hyperglycemia, reassuring CBC and a negative troponin.  EKG reassuring and chest x-ray is clear.  Given the patient's history we will repeat a troponin.  If the patient's repeat troponin remains negative I believe the patient could be discharged home given his recent hospital admission cardiac catheterization and longstanding history of angina.  Patient will need to follow-up with his cardiologist.  Repeat troponin pending, patient care signed out to oncoming provider.  FINAL CLINICAL IMPRESSION(S) / ED DIAGNOSES   Chest pain Angina   Note:  This document was prepared using Dragon voice recognition software and may include unintentional dictation errors.   Harvest Dark, MD 11/28/22 2226

## 2022-11-28 NOTE — ED Triage Notes (Signed)
Pt ED from home for CP x 3 weeks. Pt was seen here for the same two days ago. Pt made apt with cardiology but didn't go. Pt is CAOx4 and in  no acute distress.

## 2022-11-28 NOTE — Discharge Instructions (Addendum)
Apply the Mupirocin antibiotic ointment to the wounds on your face/neck twice daily until healed

## 2022-11-29 DIAGNOSIS — R079 Chest pain, unspecified: Secondary | ICD-10-CM | POA: Diagnosis not present

## 2022-11-29 LAB — TROPONIN I (HIGH SENSITIVITY): Troponin I (High Sensitivity): 15 ng/L (ref <18)

## 2022-11-29 MED ORDER — NITROGLYCERIN 0.4 MG SL SUBL
0.4000 mg | SUBLINGUAL_TABLET | SUBLINGUAL | Status: DC | PRN
  Administered 2022-11-29: 0.4 mg via SUBLINGUAL
  Filled 2022-11-29: qty 1

## 2022-11-29 MED ORDER — OXYCODONE HCL 5 MG PO TABS
10.0000 mg | ORAL_TABLET | Freq: Once | ORAL | Status: AC
  Administered 2022-11-29: 10 mg via ORAL
  Filled 2022-11-29: qty 2

## 2022-11-29 MED ORDER — MUPIROCIN 2 % EX OINT: 1.0000 | TOPICAL_OINTMENT | Freq: Two times a day (BID) | CUTANEOUS | 0 refills | Status: AC

## 2022-11-29 NOTE — ED Notes (Signed)
Patient verbalizes understanding of discharge instructions. Opportunity for questioning and answers were provided. Armband removed by staff, pt discharged from ED. Wheeled out to lobby, awaiting his ride

## 2022-11-29 NOTE — ED Provider Notes (Signed)
Assumed care from Dr. Kerman Passey at 11 PM. Briefly, the patient is a 67 y.o. male with PMHx of  has a past medical history of A-fib ([place]), Anemia, Anginal pain ([place]), Anxiety, Arthritis, Asthma, CAD (coronary artery disease), Cancer ([place]), Celiac disease, Chronic diastolic CHF (congestive heart failure) ([place]), COPD (chronic obstructive pulmonary disease) ([place]), DDD (degenerative disc disease), lumbar, Diverticulosis, Dysrhythmia, Essential hypertension, GERD (gastroesophageal reflux disease), History of hiatal hernia, History of kidney stones, History of tobacco abuse, Myocardial infarction ([place]) (2002), Pancreatitis, PSVT (paroxysmal supraventricular tachycardia), Sleep apnea, Tubular adenoma of colon, and Type II diabetes mellitus ([place]). here with chest pain, SOB..   Labs Reviewed  BASIC METABOLIC PANEL - Abnormal; Notable for the following components:      Result Value   Glucose, Bld 172 (*)    All other components within normal limits  CBC - Abnormal; Notable for the following components:   RBC 6.15 (*)    Hemoglobin 17.2 (*)    HCT 54.5 (*)    All other components within normal limits  TROPONIN I (HIGH SENSITIVITY)  TROPONIN I (HIGH SENSITIVITY)    Course of Care: -Initial trop negative. BP remains elevated, pt given analgesia. H/o recurrent CP and plan is for delta trop, outpt f/u if negative.  -Trop negative. BP improved. Suspect a component of chronic pain. Pt does also have what appears to be impetigo of his face/neck. Will place on mupirocin. No signs of cellulitis.   Duffy Bruce, MD 11/29/22 [phone]

## 2022-12-01 ENCOUNTER — Encounter: Payer: Self-pay | Admitting: Emergency Medicine

## 2022-12-01 ENCOUNTER — Other Ambulatory Visit: Payer: Self-pay

## 2022-12-01 DIAGNOSIS — R0789 Other chest pain: Secondary | ICD-10-CM | POA: Insufficient documentation

## 2022-12-01 DIAGNOSIS — I4891 Unspecified atrial fibrillation: Secondary | ICD-10-CM | POA: Insufficient documentation

## 2022-12-01 DIAGNOSIS — Z955 Presence of coronary angioplasty implant and graft: Secondary | ICD-10-CM | POA: Diagnosis not present

## 2022-12-01 DIAGNOSIS — I5032 Chronic diastolic (congestive) heart failure: Secondary | ICD-10-CM | POA: Insufficient documentation

## 2022-12-01 DIAGNOSIS — E119 Type 2 diabetes mellitus without complications: Secondary | ICD-10-CM | POA: Insufficient documentation

## 2022-12-01 DIAGNOSIS — J449 Chronic obstructive pulmonary disease, unspecified: Secondary | ICD-10-CM | POA: Diagnosis not present

## 2022-12-01 DIAGNOSIS — Z7951 Long term (current) use of inhaled steroids: Secondary | ICD-10-CM | POA: Diagnosis not present

## 2022-12-01 DIAGNOSIS — G8929 Other chronic pain: Secondary | ICD-10-CM | POA: Insufficient documentation

## 2022-12-01 DIAGNOSIS — I110 Hypertensive heart disease with heart failure: Secondary | ICD-10-CM | POA: Insufficient documentation

## 2022-12-01 DIAGNOSIS — J45909 Unspecified asthma, uncomplicated: Secondary | ICD-10-CM | POA: Insufficient documentation

## 2022-12-01 DIAGNOSIS — I2510 Atherosclerotic heart disease of native coronary artery without angina pectoris: Secondary | ICD-10-CM | POA: Diagnosis not present

## 2022-12-01 DIAGNOSIS — Z7984 Long term (current) use of oral hypoglycemic drugs: Secondary | ICD-10-CM | POA: Insufficient documentation

## 2022-12-01 NOTE — ED Notes (Signed)
Blue top tube sent to the lab with blood work at this time.

## 2022-12-01 NOTE — ED Triage Notes (Addendum)
EMS brings pt in from home for c/o CP; seen recently for same but st symptoms same; 3 NTG taken at home with '324mg'$  ASA at 1045pm and again 38mn PTA; pt to triage via w/c with no distress noted; reports left sided CP radiating down left arm accomp by nausea; O2 at 2l/min via [place] at all times

## 2022-12-02 ENCOUNTER — Emergency Department: Payer: Medicare Other

## 2022-12-02 ENCOUNTER — Emergency Department
Admission: EM | Admit: 2022-12-02 | Discharge: 2022-12-02 | Disposition: A | Discharge status: Home / Self Care | Payer: Medicare Other | Attending: Emergency Medicine | Admitting: Emergency Medicine

## 2022-12-02 DIAGNOSIS — G8929 Other chronic pain: Secondary | ICD-10-CM

## 2022-12-02 DIAGNOSIS — R0789 Other chest pain: Secondary | ICD-10-CM | POA: Diagnosis not present

## 2022-12-02 LAB — CBC
HCT: 51.8 % (ref 39.0–52.0)
Hemoglobin: 16.7 g/dL (ref 13.0–17.0)
MCH: 28.2 pg (ref 26.0–34.0)
MCHC: 32.2 g/dL (ref 30.0–36.0)
MCV: 87.5 fL (ref 80.0–100.0)
Platelets: 214 10*3/uL (ref 150–400)
RBC: 5.92 MIL/uL — ABNORMAL HIGH (ref 4.22–5.81)
RDW: 14.6 % (ref 11.5–15.5)
WBC: 9.1 10*3/uL (ref 4.0–10.5)
nRBC: 0 % (ref 0.0–0.2)

## 2022-12-02 LAB — BASIC METABOLIC PANEL
Anion gap: 10 (ref 5–15)
BUN: 15 mg/dL (ref 8–23)
CO2: 30 mmol/L (ref 22–32)
Calcium: 9.4 mg/dL (ref 8.9–10.3)
Chloride: 101 mmol/L (ref 98–111)
Creatinine, Ser: 1.02 mg/dL (ref 0.61–1.24)
GFR, Estimated: >60 mL/min (ref >60)
Glucose, Bld: 164 mg/dL — ABNORMAL HIGH (ref 70–99)
Potassium: 3 mmol/L — ABNORMAL LOW (ref 3.5–5.1)
Sodium: 141 mmol/L (ref 135–145)

## 2022-12-02 LAB — TROPONIN I (HIGH SENSITIVITY)
Troponin I (High Sensitivity): 20 ng/L — ABNORMAL HIGH (ref <18)
Troponin I (High Sensitivity): 21 ng/L — ABNORMAL HIGH (ref <18)

## 2022-12-02 MED ORDER — POTASSIUM CHLORIDE CRYS ER 20 MEQ PO TBCR
40.0000 meq | EXTENDED_RELEASE_TABLET | Freq: Once | ORAL | Status: AC
  Administered 2022-12-02: 40 meq via ORAL
  Filled 2022-12-02: qty 2

## 2022-12-02 MED ORDER — MORPHINE SULFATE (PF) 4 MG/ML IV SOLN
4.0000 mg | Freq: Once | INTRAVENOUS | Status: AC
  Administered 2022-12-02: 4 mg via INTRAVENOUS
  Filled 2022-12-02: qty 1

## 2022-12-02 MED ORDER — ONDANSETRON HCL 4 MG/2ML IJ SOLN
4.0000 mg | Freq: Once | INTRAMUSCULAR | Status: AC
  Administered 2022-12-02: 4 mg via INTRAVENOUS
  Filled 2022-12-02: qty 2

## 2022-12-02 NOTE — ED Provider Notes (Signed)
[facility] Provider Note    Event Date/Time   First MD Initiated Contact with Patient 12/02/22 0120     (approximate)   History   Chest Pain   HPI  Lawrence [name] is a 67 y.o. male with history of CAD, CHF, COPD, hypertension, diabetes who presents emergency department with anterior chest pain without radiation that started tonight.  Has had shortness of breath that is worse than his baseline, nausea.  No fevers or productive cough.  No dizziness, diaphoresis.  No lower extremity swelling or pain.  This feels similar to his previous episodes of chest pain.  Patient just underwent cardiac catheterization with no signs of obstructive disease on 11/02/2022 with Dr. Clayborn Bigness.  Patient states that the only thing that helps his chest pain is morphine or Dilaudid.   History provided by patient.    Past Medical History:  Diagnosis Date   A-fib ([place])    Anemia    Anginal pain ([place])    Anxiety    Arthritis    Asthma    CAD (coronary artery disease)    a. 2002 CABGx2 (LIMA->LAD, VG->VG->OM1);  b. 09/2012 DES->OM;  c. 03/2015 PTCA of LAD [facility]) in setting of atretic LIMA; d. 05/2015 [facility]): nonobs dzs; e. 06/2015 Cath ([facility]): LM nl, LAD 45p/d ISR, 50d, D1/2 small, LCX 50p/d ISR, OM1 70ost, 30 ISR, VG->OM1 50ost, 64m LIMA->LAD 99p/d - atretic, RCA dom, nl; f.cath 10/16: 40-50%(FFR 0.90) pLAD, 75% (FFR 0.77) mLAD s/p PCI/DES, oRCA 40% (FFR0.95)   Cancer (HCC)    SKIN CANCER ON BACK   Celiac disease    Chronic diastolic CHF (congestive heart failure) ([place]    a. 06/2009 Echo: EF 60-65%, Gr 1 DD, triv AI, mildly dil LA, nl RV.   COPD (chronic obstructive pulmonary disease) ([place]    a. Chronic bronchitis and emphysema.   DDD (degenerative disc disease), lumbar    Diverticulosis    Dysrhythmia    Essential hypertension    GERD (gastroesophageal reflux disease)    History of hiatal hernia    History of kidney stones    H/O   History of tobacco abuse    a. Quit  2014.   Myocardial infarction ([facility] 2002   4 STENTS   Pancreatitis    PSVT (paroxysmal supraventricular tachycardia)    a. 10/2012 Noted on Zio Patch.   Sleep apnea    LOST CORD TO CPAP -ONLY 02 @ BEDTIME   Tubular adenoma of colon    Type II diabetes mellitus ([place]     Past Surgical History:  Procedure Laterality Date   BYPASS GRAFT     CARDIAC CATHETERIZATION N/A 07/12/2015   rocedure: Left Heart Cath and Cors/Grafts Angiography;  Surgeon: HBelva Crome MD;  [place];  Service: Cardiovascular;  Laterality: N/A;   CARDIAC CATHETERIZATION Right 10/07/2015   Procedure: Left Heart Cath and Cors/Grafts Angiography;  Surgeon: SDionisio David MD;  [place];  Service: Cardiovascular;  Laterality: Right;   CARDIAC CATHETERIZATION N/A 04/06/2016   Procedure: Left Heart Cath and Coronary Angiography;  Surgeon: DYolonda Kida MD;  [place];  Service: Cardiovascular;  Laterality: N/A;   CARDIAC CATHETERIZATION  04/06/2016   Procedure: Bypass Graft Angiography;  Surgeon: DYolonda Kida MD;  [place];  Service: Cardiovascular;;   CARDIAC CATHETERIZATION N/A 11/02/2016   Procedure: Left Heart Cath and Cors/Grafts Angiography and possible PCI;  Surgeon:  Yolonda Kida, MD;  Location: [place] CV LAB;  Service: Cardiovascular;  Laterality: N/A;   CARDIAC CATHETERIZATION N/A 11/02/2016   Procedure: Coronary Stent Intervention;  Surgeon: Yolonda Kida, MD;  Location: [place] CV LAB;  Service: Cardiovascular;  Laterality: N/A;   CARDIOVERSION N/A 06/29/2022   Procedure: CARDIOVERSION;  Surgeon: Corey Skains, MD;  Location: [facility] ORS;  Service: Cardiovascular;  Laterality: N/A;   CHOLECYSTECTOMY     CIRCUMCISION N/A 06/09/2019   Procedure: CIRCUMCISION ADULT;  Surgeon: Billey Co, MD;  Location: [facility] ORS;  Service: Urology;  Laterality: N/A;   COLONOSCOPY WITH PROPOFOL N/A 04/01/2018    Procedure: COLONOSCOPY WITH PROPOFOL;  Surgeon: Manya Silvas, MD;  Location: [facility] ENDOSCOPY;  Service: Endoscopy;  Laterality: N/A;   ESOPHAGEAL DILATION     ESOPHAGOGASTRODUODENOSCOPY (EGD) WITH PROPOFOL N/A 04/01/2018   Procedure: ESOPHAGOGASTRODUODENOSCOPY (EGD) WITH PROPOFOL;  Surgeon: Manya Silvas, MD;  Location: [facility] ENDOSCOPY;  Service: Endoscopy;  Laterality: N/A;   LEFT HEART CATH AND CORS/GRAFTS ANGIOGRAPHY N/A 06/12/2019   Procedure: LEFT HEART CATH AND CORS/GRAFTS ANGIOGRAPHY;  Surgeon: Teodoro Spray, MD;  Location: [place] CV LAB;  Service: Cardiovascular;  Laterality: N/A;   LEFT HEART CATH AND CORS/GRAFTS ANGIOGRAPHY N/A 03/11/2020   Procedure: LEFT HEART CATH AND CORS/GRAFTS ANGIOGRAPHY;  Surgeon: Isaias Cowman, MD;  Location: [place] CV LAB;  Service: Cardiovascular;  Laterality: N/A;   LEFT HEART CATH AND CORS/GRAFTS ANGIOGRAPHY N/A 05/01/2021   Procedure: LEFT HEART CATH AND CORS/GRAFTS ANGIOGRAPHY;  Surgeon: Corey Skains, MD;  Location: [place] CV LAB;  Service: Cardiovascular;  Laterality: N/A;   LEFT HEART CATH AND CORS/GRAFTS ANGIOGRAPHY N/A 11/02/2022   Procedure: LEFT HEART CATH AND CORS/GRAFTS ANGIOGRAPHY;  Surgeon: Yolonda Kida, MD;  Location: [place] CV LAB;  Service: Cardiovascular;  Laterality: N/A;   TONSILLECTOMY     VASCULAR SURGERY      MEDICATIONS:  Prior to Admission medications   Medication Sig Start Date End Date Taking? Authorizing Provider  Accu-Chek Softclix Lancets lancets Use as instructed to check sugar daily for type 2 diabetes. 03/03/21   Birdie Sons, MD  acetaminophen (TYLENOL) 325 MG tablet Take 2 tablets (650 mg total) by mouth every 4 (four) hours as needed for headache or mild pain. 08/07/22   Lavina Hamman, MD  albuterol (VENTOLIN HFA) 108 (90 Base) MCG/ACT inhaler INHALE 2 PUFFS BY MOUTH EVERY 6 HOURS AS NEEDED FOR SHORTNESS OF BREATH 12/02/21   Birdie Sons, MD  allopurinol (ZYLOPRIM)  300 MG tablet TAKE 1 TABLET BY MOUTH TWICE A DAY Patient taking differently: Take 300 mg by mouth 2 (two) times daily. 10/15/21   Birdie Sons, MD  ALPRAZolam Duanne Moron) 1 MG tablet Take 1 tablet (1 mg total) by mouth 3 (three) times daily. 09/11/22   Birdie Sons, MD  amiodarone (PACERONE) 200 MG tablet Take 200 mg by mouth 2 (two) times daily. 07/06/22   [provider]  aspirin 81 MG chewable tablet Chew 81 mg by mouth daily. Swallows whole    [provider]  atorvastatin (LIPITOR) 80 MG tablet Take 1 tablet (80 mg total) by mouth daily. Please schedule office visit before any future refill. 11/19/22   Birdie Sons, MD  BELSOMRA 5 MG TABS TAKE 1 TABLET (5 MG TOTAL) BY MOUTH AT BEDTIME AS NEEDED Patient taking differently: Take 5 mg by mouth at bedtime as needed. 06/17/22   Birdie Sons, MD  Blood  Glucose Calibration (ACCU-CHEK GUIDE CONTROL) LIQD Use with blood glucose monitor as directed 02/20/21   Birdie Sons, MD  Blood Glucose Monitoring Suppl (ACCU-CHEK GUIDE) w/Device KIT Use to check blood sugars as directed 02/20/21   Birdie Sons, MD  BREO ELLIPTA 100-25 MCG/ACT AEPB INHALE 1 PUFF BY MOUTH INTO THE LUNGS DAILY. 08/31/22   Birdie Sons, MD  celecoxib (CELEBREX) 200 MG capsule Take 1 capsule (200 mg total) by mouth 2 (two) times daily as needed. Home med. 11/03/22   Enzo Bi, MD  cetirizine (ZYRTEC) 10 MG tablet TAKE 1 TABLET BY MOUTH AT BEDTIME 04/16/22   Birdie Sons, MD  diltiazem (CARDIZEM CD) 180 MG 24 hr capsule Take 1 capsule (180 mg total) by mouth daily. 07/09/22   Lorella Nimrod, MD  ELIQUIS 5 MG TABS tablet TAKE 1 TABLET BY MOUTH TWICE A DAY Patient taking differently: Take 5 mg by mouth 2 (two) times daily. 07/30/22   Birdie Sons, MD  FARXIGA 10 MG TABS tablet TAKE 1 TABLET BY MOUTH DAILY Patient taking differently: Take 10 mg by mouth daily. TAKE 1 TABLET BY MOUTH DAILY 02/09/22   Birdie Sons, MD  glucose blood (ACCU-CHEK GUIDE)  test strip Use as instructed to check sugar daily for type 2 diabetes. 03/03/21   Birdie Sons, MD  isosorbide mononitrate (IMDUR) 60 MG 24 hr tablet Take 1-2 tablets (60-120 mg total) by mouth daily. Home med. 11/03/22   Enzo Bi, MD  linaclotide [facility]) 72 MCG capsule Take 72 mcg by mouth daily before breakfast.    [provider]  lisinopril (ZESTRIL) 10 MG tablet TAKE 1 TABLET BY MOUTH DAILY Patient taking differently: Take 10 mg by mouth daily. 07/30/22   Birdie Sons, MD  meclizine (ANTIVERT) 25 MG tablet TAKE ONE TABLET BY THREE TIMES A DAY AS NEEDED FOR DIZZINESS Patient taking differently: Take 25 mg by mouth 3 (three) times daily. 08/13/22   Birdie Sons, MD  metFORMIN (GLUCOPHAGE-XR) 500 MG 24 hr tablet TAKE 1 TABLET BY MOUTH TWICE A DAY Patient taking differently: Take 500 mg by mouth 2 (two) times daily with a meal. 10/05/22   [name], [name], MD  methocarbamol (ROBAXIN) 500 MG tablet Take 500 mg by mouth every 8 (eight) hours as needed for muscle spasms. Take 1 tablet by mouth every hour as needed for muscle spasms    [name], [name], MD  metoprolol succinate (TOPROL-XL) 50 MG 24 hr tablet Take 50 mg by mouth daily. 10/29/22 10/29/23  [provider]  mupirocin ointment (BACTROBAN) 2 % Apply 1 Application topically 2 (two) times daily for 10 days. 11/29/22 12/09/22  Duffy Bruce, MD  naloxone [facility]) nasal spray 4 mg/0.1 mL 1 spray into nostril x1 and may repeat every 2-3 minutes until patient is responsive or EMS arrives 08/21/20   Birdie Sons, MD  nitroGLYCERIN (NITROSTAT) 0.4 MG SL tablet Place 1 tablet (0.4 mg total) under the tongue every 5 (five) minutes as needed for chest pain. 09/10/22   Mikey Kirschner, PA-C  omega-3 acid ethyl esters (LOVAZA) 1 g capsule Take 4 capsules (4 g total) by mouth daily. 09/10/22   Mikey Kirschner, PA-C  omeprazole (PRILOSEC) 40 MG capsule TAKE 1 CAPSULE BY MOUTH 2 TIMES DAILY 30 MINUTES BEFORE BREAKFAST AND  DINNER 07/15/22   Birdie Sons, MD  oxyCODONE-acetaminophen (PERCOCET) 10-325 MG tablet TAKE 1 TABLET BY MOUTH EVERY 4 HOURS AS NEEDED FOR PAIN. 11/03/22   [name],  [name], MD  ranolazine (RANEXA) 1000 MG SR tablet Take 1 tablet (1,000 mg total) by mouth 2 (two) times daily. 04/02/21   Birdie Sons, MD  SPIRIVA HANDIHALER 18 MCG inhalation capsule PLACE 1 CAPSULE INTO INHALER AND INHALE BY MOUTH ONCE A DAY. 06/02/22   Birdie Sons, MD  torsemide (DEMADEX) 100 MG tablet Take 0.5 tablets (50 mg total) by mouth daily. 09/10/22   Mikey Kirschner, PA-C  traZODone (DESYREL) 150 MG tablet TAKE 1 TABLET BY MOUTH AT BEDTIME 11/24/22   Birdie Sons, MD  venlafaxine XR (EFFEXOR-XR) 75 MG 24 hr capsule TAKE 1 CAPSULE BY MOUTH DAILY WITH BREAKFAST 12/16/21   Birdie Sons, MD    Physical Exam   Triage Vital Signs: ED Triage Vitals [12/01/22 2349]  Enc Vitals Group     BP (!) 154/101     Pulse Rate 84     Resp 20     Temp 97.6 F (36.4 C)     Temp Source Oral     SpO2 98 %     Weight 201 lb 1 oz (91.2 kg)     Height '5\' 7"'$  (1.702 m)     Head Circumference      Peak Flow      Pain Score 9     Pain Loc      Pain Edu?      Excl. in [place]?     Most recent vital signs: Vitals:   12/01/22 2349 12/02/22 0147  BP: (!) 154/101 (!) 140/110  Pulse: 84 69  Resp: 20 15  Temp: 97.6 F (36.4 C)   SpO2: 98% 98%    CONSTITUTIONAL: Alert and oriented and responds appropriately to questions.  Chronically ill-appearing, appears older than stated age. HEAD: Normocephalic, atraumatic EYES: Conjunctivae clear, pupils appear equal, sclera nonicteric ENT: normal nose; moist mucous membranes NECK: Supple, normal ROM, no JVD CARD: RRR; S1 and S2 appreciated; no murmurs, no clicks, no rubs, no gallops RESP: Normal chest excursion without splinting or tachypnea; breath sounds clear and equal bilaterally; no wheezes, no rhonchi, no rales, no hypoxia or respiratory distress, speaking full  sentences ABD/GI: Normal bowel sounds; non-distended; soft, non-tender, no rebound, no guarding, no peritoneal signs BACK: The back appears normal EXT: Normal ROM in all joints; no deformity noted, no edema; no cyanosis, no calf tenderness or calf swelling SKIN: Normal color for age and race; warm; no rash on exposed skin NEURO: Moves all extremities equally, normal speech PSYCH: The patient's mood and manner are appropriate.   ED Results / Procedures / Treatments   LABS: (all labs ordered are listed, but only abnormal results are displayed) Labs Reviewed  CBC - Abnormal; Notable for the following components:      Result Value   RBC 5.92 (*)    All other components within normal limits  BASIC METABOLIC PANEL - Abnormal; Notable for the following components:   Potassium 3.0 (*)    Glucose, Bld 164 (*)    All other components within normal limits  TROPONIN I (HIGH SENSITIVITY) - Abnormal; Notable for the following components:   Troponin I (High Sensitivity) 20 (*)    All other components within normal limits  TROPONIN I (HIGH SENSITIVITY) - Abnormal; Notable for the following components:   Troponin I (High Sensitivity) 21 (*)    All other components within normal limits     EKG:  EKG Interpretation  Date/Time:  Tuesday December 01 2022 23:52:19 EST Ventricular Rate:  84 PR Interval:    QRS Duration: 94 QT Interval:  396 QTC Calculation: 467 R Axis:   47 Text Interpretation: Atrial fibrillation Anterolateral infarct (cited on or before 28-Nov-2022) Abnormal ECG When compared with ECG of 28-Nov-2022 21:34, Criteria for Inferior infarct are no longer Present Questionable change in initial forces of Lateral leads ST no longer elevated in Inferior leads Confirmed by Pryor Curia [phone]) on 12/02/2022 1:21:18 AM         RADIOLOGY: My personal review and interpretation of imaging: Patient chest x-ray shows no acute abnormality.  I have personally reviewed all radiology  reports.   DG Chest 1 View  Result Date: 12/02/2022 CLINICAL DATA:  Chest pain EXAM: PORTABLE CHEST 1 VIEW COMPARISON:  11/28/2022 FINDINGS: Cardiac shadow is stable. Aortic calcifications are seen. Postsurgical changes are again noted. Lungs are well aerated bilaterally without focal infiltrate or sizable effusion. No bony abnormality is noted. IMPRESSION: No active disease. Electronically Signed   By: Inez Catalina M.D.   On: 12/02/2022 00:39     PROCEDURES:  Critical Care performed: No     .1-3 Lead EKG Interpretation  Performed by: Kamyah Wilhelmsen, Delice Bison, DO Authorized by: Lessly Stigler, Delice Bison, DO     Interpretation: normal     ECG rate:  69   ECG rate assessment: normal     Rhythm: sinus rhythm     Ectopy: none     Conduction: normal       IMPRESSION / MDM / ASSESSMENT AND PLAN / ED COURSE  I reviewed the triage vital signs and the nursing notes.    Patient here with chest pain.  Chronic history of the same.  Recent cath showed nonobstructive disease.  The patient is on the cardiac monitor to evaluate for evidence of arrhythmia and/or significant heart rate changes.   DIFFERENTIAL DIAGNOSIS (includes but not limited to):   Chronic chest pain, low suspicion for ACS, PE, dissection, CHF exacerbation, COPD exacerbation   Patient's presentation is most consistent with acute presentation with potential threat to life or bodily function.   PLAN: Will obtain CBC, BMP, troponin x 2, chest x-ray.  EKG shows no new changes.  Will give morphine for pain control.   MEDICATIONS GIVEN IN ED: Medications  morphine (PF) 4 MG/ML injection 4 mg (4 mg Intravenous Given 12/02/22 0152)  ondansetron (ZOFRAN) injection 4 mg (4 mg Intravenous Given 12/02/22 0152)  potassium chloride SA (KLOR-CON M) CR tablet 40 mEq (40 mEq Oral Given 12/02/22 0154)     ED COURSE: No leukocytosis.  Potassium 3.0.  Will give replacement.  Troponins flat.  Chest x-ray reviewed and interpreted by myself and the  radiologist and shows no acute abnormality.  No edema.  Will discharge home with outpatient follow-up.   At this time, I do not feel there is any life-threatening condition present. I reviewed all nursing notes, vitals, pertinent previous records.  All lab and urine results, EKGs, imaging ordered have been independently reviewed and interpreted by myself.  I reviewed all available radiology reports from any imaging ordered this visit.  Based on my assessment, I feel the patient is safe to be discharged home without further emergent workup and can continue workup as an outpatient as needed. Discussed all findings, treatment plan as well as usual and customary return precautions.  They verbalize understanding and are comfortable with this plan.  Outpatient follow-up has been provided as needed.  All questions have been answered.    CONSULTS:  none  OUTSIDE RECORDS REVIEWED:  Cath 11/02/22:    Prox LAD to Mid LAD lesion is 30% stenosed.   Mid LAD lesion is 40% stenosed.   Prox RCA lesion is 15% stenosed.   Dist RCA lesion is 25% stenosed.   1st Mrg lesion is 70% stenosed.   RPAV-2 lesion is 60% stenosed.   RPAV-1 lesion is 85% stenosed with 85% stenosed side branch in 1st RPL.   RPDA-1 lesion is 90% stenosed.   RPDA-2 lesion is 85% stenosed.   Non-stenotic Mid LAD to Dist LAD lesion was previously treated.   Non-stenotic Prox Cx to Mid Cx lesion was previously treated.   Non-stenotic Origin to Prox Graft lesion was previously treated.   LIMA and is small.   There is mild left ventricular systolic dysfunction.   LV end diastolic pressure is mildly elevated.   The left ventricular ejection fraction is 45-50% by visual estimate.   Plan LHC from right  groin because of his persistent anginal symptoms.  Chronic angina Left ventriculogram shows mildly dilated LV with apical hypokinesis EF around 45-50 Coronaries Left main mild irregularities LAD mild to moderate disease.  Diagonal  1 Circumflex large minor irregularities occluded OM1 RCA large minor irregularities moderate disease TIMI-3 flow right dominant No significant collaterals   Grafts LIMA to mid LAD atretic SVG to OM1 widely patent   Patient cath essentially improved from previous procedure done 04/2021.  No areas of significant obstructive disease requiring intervention. Recommend aggressive medical therapy for chronic chest pain appears to mostly be noncardiac Continue aggressive medical therapy Intervention deferred Not indicated           FINAL CLINICAL IMPRESSION(S) / ED DIAGNOSES   Final diagnoses:  Chronic chest pain     Rx / DC Orders   ED Discharge Orders     None        Note:  This document was prepared using Dragon voice recognition software and may include unintentional dictation errors.   Estiven Kohan, Delice Bison, DO 12/02/22 [phone]

## 2022-12-02 NOTE — Discharge Instructions (Addendum)
Your cardiac labs showed no sign of a heart attack today.  Your EKG showed no new changes.  Chest x-ray was clear.  Please follow-up with your primary care doctor.

## 2022-12-07 ENCOUNTER — Encounter: Payer: Self-pay | Admitting: Emergency Medicine

## 2022-12-07 ENCOUNTER — Emergency Department: Payer: Medicare Other

## 2022-12-07 ENCOUNTER — Other Ambulatory Visit: Payer: Self-pay

## 2022-12-07 ENCOUNTER — Other Ambulatory Visit: Payer: Self-pay | Admitting: Family Medicine

## 2022-12-07 DIAGNOSIS — R079 Chest pain, unspecified: Secondary | ICD-10-CM | POA: Insufficient documentation

## 2022-12-07 DIAGNOSIS — R0602 Shortness of breath: Secondary | ICD-10-CM | POA: Insufficient documentation

## 2022-12-07 DIAGNOSIS — G8929 Other chronic pain: Secondary | ICD-10-CM | POA: Diagnosis not present

## 2022-12-07 DIAGNOSIS — Z79899 Other long term (current) drug therapy: Secondary | ICD-10-CM | POA: Diagnosis not present

## 2022-12-07 DIAGNOSIS — J45909 Unspecified asthma, uncomplicated: Secondary | ICD-10-CM | POA: Insufficient documentation

## 2022-12-07 DIAGNOSIS — Z87891 Personal history of nicotine dependence: Secondary | ICD-10-CM | POA: Diagnosis not present

## 2022-12-07 DIAGNOSIS — I5032 Chronic diastolic (congestive) heart failure: Secondary | ICD-10-CM | POA: Diagnosis not present

## 2022-12-07 DIAGNOSIS — Z7982 Long term (current) use of aspirin: Secondary | ICD-10-CM | POA: Diagnosis not present

## 2022-12-07 DIAGNOSIS — J449 Chronic obstructive pulmonary disease, unspecified: Secondary | ICD-10-CM | POA: Diagnosis not present

## 2022-12-07 DIAGNOSIS — Z85828 Personal history of other malignant neoplasm of skin: Secondary | ICD-10-CM | POA: Insufficient documentation

## 2022-12-07 DIAGNOSIS — M5410 Radiculopathy, site unspecified: Secondary | ICD-10-CM

## 2022-12-07 DIAGNOSIS — I2510 Atherosclerotic heart disease of native coronary artery without angina pectoris: Secondary | ICD-10-CM | POA: Diagnosis not present

## 2022-12-07 DIAGNOSIS — Z7984 Long term (current) use of oral hypoglycemic drugs: Secondary | ICD-10-CM | POA: Diagnosis not present

## 2022-12-07 DIAGNOSIS — D72829 Elevated white blood cell count, unspecified: Secondary | ICD-10-CM | POA: Diagnosis not present

## 2022-12-07 LAB — BASIC METABOLIC PANEL
Anion gap: 10 (ref 5–15)
BUN: 13 mg/dL (ref 8–23)
CO2: 25 mmol/L (ref 22–32)
Calcium: 9.4 mg/dL (ref 8.9–10.3)
Chloride: 100 mmol/L (ref 98–111)
Creatinine, Ser: 1.01 mg/dL (ref 0.61–1.24)
GFR, Estimated: >60 mL/min (ref >60)
Glucose, Bld: 192 mg/dL — ABNORMAL HIGH (ref 70–99)
Potassium: 3.2 mmol/L — ABNORMAL LOW (ref 3.5–5.1)
Sodium: 135 mmol/L (ref 135–145)

## 2022-12-07 LAB — CBC
HCT: 55 % — ABNORMAL HIGH (ref 39.0–52.0)
Hemoglobin: 17.6 g/dL — ABNORMAL HIGH (ref 13.0–17.0)
MCH: 28 pg (ref 26.0–34.0)
MCHC: 32 g/dL (ref 30.0–36.0)
MCV: 87.6 fL (ref 80.0–100.0)
Platelets: 143 10*3/uL — ABNORMAL LOW (ref 150–400)
RBC: 6.28 MIL/uL — ABNORMAL HIGH (ref 4.22–5.81)
RDW: 15.2 % (ref 11.5–15.5)
WBC: 13.9 10*3/uL — ABNORMAL HIGH (ref 4.0–10.5)
nRBC: 0 % (ref 0.0–0.2)

## 2022-12-07 LAB — TROPONIN I (HIGH SENSITIVITY)
Troponin I (High Sensitivity): 25 ng/L — ABNORMAL HIGH (ref <18)
Troponin I (High Sensitivity): 23 ng/L — ABNORMAL HIGH (ref <18)

## 2022-12-07 NOTE — ED Triage Notes (Signed)
Pt presents via POV with complaints of L sided CP with radiation to the L arm. Seen here and [facility] multiple times without any significant change. States his Cardiologist "can't find nothing wrong". Pt took '324mg'$  ASA & 3 Nitro PTA with no change in pain. Rates his pain a 9/10. Hx of afib, COPD, and CHF- compliant with medications. Of note, the patient states he's supposed to wear 2L [place] at all times but isnt wearing it because he was at his GF house and left his portable tank at home. Denies cough, nasal congestion,  SOB.

## 2022-12-08 ENCOUNTER — Emergency Department
Admission: EM | Admit: 2022-12-08 | Discharge: 2022-12-08 | Disposition: A | Discharge status: Home / Self Care | Payer: Medicare Other | Attending: Emergency Medicine | Admitting: Emergency Medicine

## 2022-12-08 DIAGNOSIS — G8929 Other chronic pain: Secondary | ICD-10-CM

## 2022-12-08 DIAGNOSIS — R079 Chest pain, unspecified: Secondary | ICD-10-CM | POA: Diagnosis not present

## 2022-12-08 MED ORDER — MORPHINE SULFATE (PF) 4 MG/ML IV SOLN
4.0000 mg | Freq: Once | INTRAVENOUS | Status: AC
  Administered 2022-12-08: 4 mg via INTRAMUSCULAR
  Filled 2022-12-08: qty 1

## 2022-12-08 NOTE — ED Provider Notes (Signed)
[facility] Provider Note    Event Date/Time   First MD Initiated Contact with Patient 12/08/22 [phone]     (approximate)   History   Chest Pain   HPI  Lawrence [name] is a 67 y.o. male with history of A-fib, coronary artery disease, diastolic heart failure, COPD on chronic oxygen who presents to the emergency department with chronic chest pain.  He has recently had a cardiac catheterization that showed no obstructive disease.  Denies cough.  Chronically short of breath.  States that this is unchanged from his chronic pain.  States the only thing that improves it is morphine.   History provided by patient.    Past Medical History:  Diagnosis Date   A-fib ([place])    Anemia    Anginal pain ([place])    Anxiety    Arthritis    Asthma    CAD (coronary artery disease)    a. 2002 CABGx2 (LIMA->LAD, VG->VG->OM1);  b. 09/2012 DES->OM;  c. 03/2015 PTCA of LAD [facility]) in setting of atretic LIMA; d. 05/2015 Cath [facility]): nonobs dzs; e. 06/2015 Cath ([facility]): LM nl, LAD 45p/d ISR, 50d, D1/2 small, LCX 50p/d ISR, OM1 70ost, 30 ISR, VG->OM1 50ost, 16m LIMA->LAD 99p/d - atretic, RCA dom, nl; f.cath 10/16: 40-50%(FFR 0.90) pLAD, 75% (FFR 0.77) mLAD s/p PCI/DES, oRCA 40% (FFR0.95)   Cancer (HCC)    SKIN CANCER ON BACK   Celiac disease    Chronic diastolic CHF (congestive heart failure) ([place]    a. 06/2009 Echo: EF 60-65%, Gr 1 DD, triv AI, mildly dil LA, nl RV.   COPD (chronic obstructive pulmonary disease) ([place]    a. Chronic bronchitis and emphysema.   DDD (degenerative disc disease), lumbar    Diverticulosis    Dysrhythmia    Essential hypertension    GERD (gastroesophageal reflux disease)    History of hiatal hernia    History of kidney stones    H/O   History of tobacco abuse    a. Quit 2014.   Myocardial infarction ([facility] 2002   4 STENTS   Pancreatitis    PSVT (paroxysmal supraventricular tachycardia)    a. 10/2012 Noted on [facility] Patch.   Sleep apnea    LOST CORD TO  CPAP -ONLY 02 @ BEDTIME   Tubular adenoma of colon    Type II diabetes mellitus ([place]     Past Surgical History:  Procedure Laterality Date   BYPASS GRAFT     CARDIAC CATHETERIZATION N/A 07/12/2015   rocedure: Left Heart Cath and Cors/Grafts Angiography;  Surgeon: HBelva Crome MD;  [place];  Service: Cardiovascular;  Laterality: N/A;   CARDIAC CATHETERIZATION Right 10/07/2015   Procedure: Left Heart Cath and Cors/Grafts Angiography;  Surgeon: SDionisio David MD;  [place];  Service: Cardiovascular;  Laterality: Right;   CARDIAC CATHETERIZATION N/A 04/06/2016   Procedure: Left Heart Cath and Coronary Angiography;  Surgeon: DYolonda Kida MD;  [place];  Service: Cardiovascular;  Laterality: N/A;   CARDIAC CATHETERIZATION  04/06/2016   Procedure: Bypass Graft Angiography;  Surgeon: DYolonda Kida MD;  [place];  Service: Cardiovascular;;   CARDIAC CATHETERIZATION N/A 11/02/2016   Procedure: Left Heart Cath and Cors/Grafts Angiography and possible PCI;  Surgeon: DYolonda Kida MD;  [place];  Service: Cardiovascular;  Laterality: N/A;   CARDIAC CATHETERIZATION N/A 11/02/2016   Procedure: Coronary Stent Intervention;  Surgeon:  Yolonda Kida, MD;  Location: [place] CV LAB;  Service: Cardiovascular;  Laterality: N/A;   CARDIOVERSION N/A 06/29/2022   Procedure: CARDIOVERSION;  Surgeon: Corey Skains, MD;  Location: [facility] ORS;  Service: Cardiovascular;  Laterality: N/A;   CHOLECYSTECTOMY     CIRCUMCISION N/A 06/09/2019   Procedure: CIRCUMCISION ADULT;  Surgeon: Billey Co, MD;  Location: [facility] ORS;  Service: Urology;  Laterality: N/A;   COLONOSCOPY WITH PROPOFOL N/A 04/01/2018   Procedure: COLONOSCOPY WITH PROPOFOL;  Surgeon: Manya Silvas, MD;  Location: [facility]COPY;  Service: Endoscopy;  Laterality: N/A;   ESOPHAGEAL DILATION      ESOPHAGOGASTRODUODENOSCOPY (EGD) WITH PROPOFOL N/A 04/01/2018   Procedure: ESOPHAGOGASTRODUODENOSCOPY (EGD) WITH PROPOFOL;  Surgeon: Manya Silvas, MD;  Location: [facility] ENDOSCOPY;  Service: Endoscopy;  Laterality: N/A;   LEFT HEART CATH AND CORS/GRAFTS ANGIOGRAPHY N/A 06/12/2019   Procedure: LEFT HEART CATH AND CORS/GRAFTS ANGIOGRAPHY;  Surgeon: Teodoro Spray, MD;  Location: [place] CV LAB;  Service: Cardiovascular;  Laterality: N/A;   LEFT HEART CATH AND CORS/GRAFTS ANGIOGRAPHY N/A 03/11/2020   Procedure: LEFT HEART CATH AND CORS/GRAFTS ANGIOGRAPHY;  Surgeon: Isaias Cowman, MD;  Location: [place] CV LAB;  Service: Cardiovascular;  Laterality: N/A;   LEFT HEART CATH AND CORS/GRAFTS ANGIOGRAPHY N/A 05/01/2021   Procedure: LEFT HEART CATH AND CORS/GRAFTS ANGIOGRAPHY;  Surgeon: Corey Skains, MD;  Location: [place] CV LAB;  Service: Cardiovascular;  Laterality: N/A;   LEFT HEART CATH AND CORS/GRAFTS ANGIOGRAPHY N/A 11/02/2022   Procedure: LEFT HEART CATH AND CORS/GRAFTS ANGIOGRAPHY;  Surgeon: Yolonda Kida, MD;  Location: [place] CV LAB;  Service: Cardiovascular;  Laterality: N/A;   TONSILLECTOMY     VASCULAR SURGERY      MEDICATIONS:  Prior to Admission medications   Medication Sig Start Date End Date Taking? Authorizing Provider  Accu-Chek Softclix Lancets lancets Use as instructed to check sugar daily for type 2 diabetes. 03/03/21   Birdie Sons, MD  acetaminophen (TYLENOL) 325 MG tablet Take 2 tablets (650 mg total) by mouth every 4 (four) hours as needed for headache or mild pain. 08/07/22   Lavina Hamman, MD  albuterol (VENTOLIN HFA) 108 (90 Base) MCG/ACT inhaler INHALE 2 PUFFS BY MOUTH EVERY 6 HOURS AS NEEDED FOR SHORTNESS OF BREATH 12/02/21   Birdie Sons, MD  allopurinol (ZYLOPRIM) 300 MG tablet TAKE 1 TABLET BY MOUTH TWICE A DAY Patient taking differently: Take 300 mg by mouth 2 (two) times daily. 10/15/21   Birdie Sons, MD  ALPRAZolam  Duanne Moron) 1 MG tablet Take 1 tablet (1 mg total) by mouth 3 (three) times daily. 09/11/22   Birdie Sons, MD  amiodarone (PACERONE) 200 MG tablet Take 200 mg by mouth 2 (two) times daily. 07/06/22   [provider]  aspirin 81 MG chewable tablet Chew 81 mg by mouth daily. Swallows whole    [provider]  atorvastatin (LIPITOR) 80 MG tablet Take 1 tablet (80 mg total) by mouth daily. Please schedule office visit before any future refill. 11/19/22   Birdie Sons, MD  BELSOMRA 5 MG TABS TAKE 1 TABLET (5 MG TOTAL) BY MOUTH AT BEDTIME AS NEEDED Patient taking differently: Take 5 mg by mouth at bedtime as needed. 06/17/22   Birdie Sons, MD  Blood Glucose Calibration (ACCU-CHEK GUIDE CONTROL) LIQD Use with blood glucose monitor as directed 02/20/21   Birdie Sons, MD  Blood Glucose Monitoring Suppl (ACCU-CHEK GUIDE) w/Device KIT Use  to check blood sugars as directed 02/20/21   Birdie Sons, MD  BREO ELLIPTA 100-25 MCG/ACT AEPB INHALE 1 PUFF BY MOUTH INTO THE LUNGS DAILY. 08/31/22   Birdie Sons, MD  celecoxib (CELEBREX) 200 MG capsule Take 1 capsule (200 mg total) by mouth 2 (two) times daily as needed. Home med. 11/03/22   Enzo Bi, MD  cetirizine (ZYRTEC) 10 MG tablet TAKE 1 TABLET BY MOUTH AT BEDTIME 04/16/22   Birdie Sons, MD  diltiazem (CARDIZEM CD) 180 MG 24 hr capsule Take 1 capsule (180 mg total) by mouth daily. 07/09/22   Lorella Nimrod, MD  ELIQUIS 5 MG TABS tablet TAKE 1 TABLET BY MOUTH TWICE A DAY Patient taking differently: Take 5 mg by mouth 2 (two) times daily. 07/30/22   Birdie Sons, MD  FARXIGA 10 MG TABS tablet TAKE 1 TABLET BY MOUTH DAILY Patient taking differently: Take 10 mg by mouth daily. TAKE 1 TABLET BY MOUTH DAILY 02/09/22   Birdie Sons, MD  glucose blood (ACCU-CHEK GUIDE) test strip Use as instructed to check sugar daily for type 2 diabetes. 03/03/21   Birdie Sons, MD  isosorbide mononitrate (IMDUR) 60 MG 24 hr tablet Take 1-2  tablets (60-120 mg total) by mouth daily. Home med. 11/03/22   Enzo Bi, MD  linaclotide [facility]) 72 MCG capsule Take 72 mcg by mouth daily before breakfast.    [provider]  lisinopril (ZESTRIL) 10 MG tablet TAKE 1 TABLET BY MOUTH DAILY Patient taking differently: Take 10 mg by mouth daily. 07/30/22   Birdie Sons, MD  meclizine (ANTIVERT) 25 MG tablet TAKE ONE TABLET BY THREE TIMES A DAY AS NEEDED FOR DIZZINESS Patient taking differently: Take 25 mg by mouth 3 (three) times daily. 08/13/22   Birdie Sons, MD  metFORMIN (GLUCOPHAGE-XR) 500 MG 24 hr tablet TAKE 1 TABLET BY MOUTH TWICE A DAY Patient taking differently: Take 500 mg by mouth 2 (two) times daily with a meal. 10/05/22   [name], [name], MD  methocarbamol (ROBAXIN) 500 MG tablet Take 500 mg by mouth every 8 (eight) hours as needed for muscle spasms. Take 1 tablet by mouth every hour as needed for muscle spasms    [name], [name], MD  metoprolol succinate (TOPROL-XL) 50 MG 24 hr tablet Take 50 mg by mouth daily. 10/29/22 10/29/23  [provider]  mupirocin ointment (BACTROBAN) 2 % Apply 1 Application topically 2 (two) times daily for 10 days. 11/29/22 12/09/22  Duffy Bruce, MD  naloxone [facility]) nasal spray 4 mg/0.1 mL 1 spray into nostril x1 and may repeat every 2-3 minutes until patient is responsive or EMS arrives 08/21/20   Birdie Sons, MD  nitroGLYCERIN (NITROSTAT) 0.4 MG SL tablet Place 1 tablet (0.4 mg total) under the tongue every 5 (five) minutes as needed for chest pain. 09/10/22   Mikey Kirschner, PA-C  omega-3 acid ethyl esters (LOVAZA) 1 g capsule Take 4 capsules (4 g total) by mouth daily. 09/10/22   Mikey Kirschner, PA-C  omeprazole (PRILOSEC) 40 MG capsule TAKE 1 CAPSULE BY MOUTH 2 TIMES DAILY 30 MINUTES BEFORE BREAKFAST AND DINNER 07/15/22   Birdie Sons, MD  oxyCODONE-acetaminophen (PERCOCET) 10-325 MG tablet TAKE 1 TABLET BY MOUTH EVERY 4 HOURS AS NEEDED FOR PAIN. 11/03/22   Birdie Sons, MD  ranolazine (RANEXA) 1000 MG SR tablet Take 1 tablet (1,000 mg total) by mouth 2 (two) times daily. 04/02/21   Birdie Sons, MD  SPIRIVA HANDIHALER 18 MCG inhalation capsule PLACE 1 CAPSULE INTO INHALER AND INHALE BY MOUTH ONCE A DAY. 06/02/22   Birdie Sons, MD  torsemide (DEMADEX) 100 MG tablet Take 0.5 tablets (50 mg total) by mouth daily. 09/10/22   Mikey Kirschner, PA-C  traZODone (DESYREL) 150 MG tablet TAKE 1 TABLET BY MOUTH AT BEDTIME 11/24/22   Birdie Sons, MD  venlafaxine XR (EFFEXOR-XR) 75 MG 24 hr capsule TAKE 1 CAPSULE BY MOUTH DAILY WITH BREAKFAST 12/16/21   Birdie Sons, MD    Physical Exam   Triage Vital Signs: ED Triage Vitals  Enc Vitals Group     BP 12/07/22 2021 (!) 184/95     Pulse Rate 12/07/22 2021 (!) 103     Resp 12/07/22 2021 18     Temp 12/07/22 2021 98.1 F (36.7 C)     Temp Source 12/07/22 2242 Oral     SpO2 12/07/22 2021 96 %     Weight --      Height --      Head Circumference --      Peak Flow --      Pain Score 12/07/22 2024 9     Pain Loc --      Pain Edu? --      Excl. in [place]? --     Most recent vital signs: Vitals:   12/07/22 2242 12/08/22 0140  BP: (!) 140/110 (!) 162/95  Pulse: (!) 54 89  Resp: 18 15  Temp: 98 F (36.7 C)   SpO2: 100% 99%    CONSTITUTIONAL: Alert, responds appropriately to questions.  Medically ill-appearing, in no distress HEAD: Normocephalic, atraumatic EYES: Conjunctivae clear, pupils appear equal, sclera nonicteric ENT: normal nose; moist mucous membranes NECK: Supple, normal ROM CARD: Irregularly irregular and minimally tachycardic; S1 and S2 appreciated RESP: Normal chest excursion without splinting or tachypnea; breath sounds clear and equal bilaterally; no wheezes, no rhonchi, no rales, no hypoxia or respiratory distress, speaking full sentences ABD/GI: Non-distended; soft, non-tender, no rebound, no guarding, no peritoneal signs BACK: The back appears normal EXT: Normal ROM in all  joints; no deformity noted, no edema, no calf tenderness or calf swelling SKIN: Normal color for age and race; warm; no rash on exposed skin NEURO: Moves all extremities equally, normal speech PSYCH: The patient's mood and manner are appropriate.   ED Results / Procedures / Treatments   LABS: (all labs ordered are listed, but only abnormal results are displayed) Labs Reviewed  BASIC METABOLIC PANEL - Abnormal; Notable for the following components:      Result Value   Potassium 3.2 (*)    Glucose, Bld 192 (*)    All other components within normal limits  CBC - Abnormal; Notable for the following components:   WBC 13.9 (*)    RBC 6.28 (*)    Hemoglobin 17.6 (*)    HCT 55.0 (*)    Platelets 143 (*)    All other components within normal limits  TROPONIN I (HIGH SENSITIVITY) - Abnormal; Notable for the following components:   Troponin I (High Sensitivity) 25 (*)    All other components within normal limits  TROPONIN I (HIGH SENSITIVITY) - Abnormal; Notable for the following components:   Troponin I (High Sensitivity) 23 (*)    All other components within normal limits     EKG:  EKG Interpretation  Date/Time:  Monday December 07 2022 20:26:57 EST Ventricular Rate:  113 PR Interval:    QRS Duration: 96  QT Interval:  304 QTC Calculation: 416 R Axis:   75 Text Interpretation: Atrial fibrillation with rapid ventricular response with premature ventricular or aberrantly conducted complexes Inferior infarct , age undetermined Anterior infarct (cited on or before 28-Nov-2022) Abnormal ECG When compared with ECG of 01-Dec-2022 23:52, Inferior infarct is now Present Questionable change in initial forces of Lateral leads Nonspecific T wave abnormality now evident in Lateral leads Confirmed by Pryor Curia [phone]) on 12/08/2022 1:40:11 AM         RADIOLOGY: My personal review and interpretation of imaging: Chest x-ray clear  I have personally reviewed all radiology reports.   DG  Chest 2 View  Result Date: 12/07/2022 CLINICAL DATA:  Chest pain. EXAM: CHEST - 2 VIEW COMPARISON:  Chest radiograph dated 12/02/2022. FINDINGS: Mild chronic interstitial coarsening. No focal consolidation, pleural effusion, or pneumothorax. The cardiac silhouette is within limits. Atherosclerotic calcification of the aorta. Medius. Median sternotomy wires and CABG vascular clips. No acute osseous pathology. Degenerative changes of the spine. IMPRESSION: No active cardiopulmonary disease. Electronically Signed   By: Anner Crete M.D.   On: 12/07/2022 21:09     PROCEDURES:  Critical Care performed: No      Procedures    IMPRESSION / MDM / ASSESSMENT AND PLAN / ED COURSE  I reviewed the triage vital signs and the nursing notes.    Patient here with chronic chest pain.  The patient is on the cardiac monitor to evaluate for evidence of arrhythmia and/or significant heart rate changes.   DIFFERENTIAL DIAGNOSIS (includes but not limited to):   Chronic pain, musculoskeletal pain, less likely ACS, PE, CHF, dissection   Patient's presentation is most consistent with acute presentation with potential threat to life or bodily function.   PLAN: Workup initiated from triage.  Patient does have a slight leukocytosis of 13,000 but no infectious symptoms.  Chest x-ray reviewed and interpreted by myself and the radiologist and shows no pneumonia, edema, pneumothorax, widened mediastinum, cardiomegaly.  Troponins flat today.  EKG shows A-fib which is chronic for patient with no new ischemic abnormality.  Low suspicion for PE, dissection.  This seems to be an exacerbation of his chronic chest pain.  I have recommended close follow-up with his PCP as he may need pain management for further treatment, evaluation for his chronic chest pain.   MEDICATIONS GIVEN IN ED: Medications  morphine (PF) 4 MG/ML injection 4 mg (4 mg Intramuscular Given 12/08/22 0141)     ED COURSE:  At this time, I do  not feel there is any life-threatening condition present. I reviewed all nursing notes, vitals, pertinent previous records.  All lab and urine results, EKGs, imaging ordered have been independently reviewed and interpreted by myself.  I reviewed all available radiology reports from any imaging ordered this visit.  Based on my assessment, I feel the patient is safe to be discharged home without further emergent workup and can continue workup as an outpatient as needed. Discussed all findings, treatment plan as well as usual and customary return precautions.  They verbalize understanding and are comfortable with this plan.  Outpatient follow-up has been provided as needed.  All questions have been answered.    CONSULTS:  none   OUTSIDE RECORDS REVIEWED: Reviewed patient's recent cardiac catheterization and echocardiogram.       FINAL CLINICAL IMPRESSION(S) / ED DIAGNOSES   Final diagnoses:  Chronic chest pain     Rx / DC Orders   ED Discharge Orders  None        Note:  This document was prepared using Dragon voice recognition software and may include unintentional dictation errors.   Lawrence [name], Delice Bison, DO 12/08/22 [phone]

## 2022-12-12 ENCOUNTER — Emergency Department
Admission: EM | Admit: 2022-12-12 | Discharge: 2022-12-12 | Disposition: A | Discharge status: Home / Self Care | Payer: Medicare Other | Attending: Emergency Medicine | Admitting: Emergency Medicine

## 2022-12-12 ENCOUNTER — Emergency Department: Payer: Medicare Other

## 2022-12-12 ENCOUNTER — Other Ambulatory Visit: Payer: Self-pay

## 2022-12-12 DIAGNOSIS — I10 Essential (primary) hypertension: Secondary | ICD-10-CM

## 2022-12-12 DIAGNOSIS — R410 Disorientation, unspecified: Secondary | ICD-10-CM | POA: Insufficient documentation

## 2022-12-12 DIAGNOSIS — R079 Chest pain, unspecified: Secondary | ICD-10-CM | POA: Diagnosis present

## 2022-12-12 LAB — CBG MONITORING, ED: Glucose-Capillary: 116 mg/dL — ABNORMAL HIGH (ref 70–99)

## 2022-12-12 LAB — URINALYSIS, ROUTINE W REFLEX MICROSCOPIC
Bilirubin Urine: NEGATIVE
Glucose, UA: NEGATIVE mg/dL
Ketones, ur: NEGATIVE mg/dL
Leukocytes,Ua: NEGATIVE
Nitrite: NEGATIVE
Protein, ur: 30 mg/dL — AB
Specific Gravity, Urine: 1.014 (ref 1.005–1.030)
Squamous Epithelial / HPF: NONE SEEN /HPF (ref 0–5)
pH: 5 (ref 5.0–8.0)

## 2022-12-12 LAB — BASIC METABOLIC PANEL
Anion gap: 9 (ref 5–15)
BUN: 11 mg/dL (ref 8–23)
CO2: 30 mmol/L (ref 22–32)
Calcium: 9.1 mg/dL (ref 8.9–10.3)
Chloride: 99 mmol/L (ref 98–111)
Creatinine, Ser: 0.96 mg/dL (ref 0.61–1.24)
GFR, Estimated: >60 mL/min (ref >60)
Glucose, Bld: 132 mg/dL — ABNORMAL HIGH (ref 70–99)
Potassium: 3.5 mmol/L (ref 3.5–5.1)
Sodium: 138 mmol/L (ref 135–145)

## 2022-12-12 LAB — CBC
HCT: 50 % (ref 39.0–52.0)
Hemoglobin: 15.5 g/dL (ref 13.0–17.0)
MCH: 28.2 pg (ref 26.0–34.0)
MCHC: 31 g/dL (ref 30.0–36.0)
MCV: 90.9 fL (ref 80.0–100.0)
Platelets: 151 10*3/uL (ref 150–400)
RBC: 5.5 MIL/uL (ref 4.22–5.81)
RDW: 14.2 % (ref 11.5–15.5)
WBC: 5.6 10*3/uL (ref 4.0–10.5)
nRBC: 0 % (ref 0.0–0.2)

## 2022-12-12 LAB — TROPONIN I (HIGH SENSITIVITY)
Troponin I (High Sensitivity): 10 ng/L (ref <18)
Troponin I (High Sensitivity): 10 ng/L (ref <18)

## 2022-12-12 MED ORDER — LABETALOL HCL 5 MG/ML IV SOLN
10.0000 mg | Freq: Once | INTRAVENOUS | Status: AC
  Administered 2022-12-12: 10 mg via INTRAVENOUS
  Filled 2022-12-12: qty 4

## 2022-12-12 MED ORDER — NITROGLYCERIN 0.4 MG SL SUBL
0.4000 mg | SUBLINGUAL_TABLET | SUBLINGUAL | Status: DC | PRN
  Filled 2022-12-12: qty 1

## 2022-12-12 MED ORDER — MORPHINE SULFATE (PF) 4 MG/ML IV SOLN
4.0000 mg | Freq: Once | INTRAVENOUS | Status: AC
  Administered 2022-12-12: 4 mg via INTRAVENOUS
  Filled 2022-12-12: qty 1

## 2022-12-12 MED ORDER — ONDANSETRON HCL 4 MG/2ML IJ SOLN
4.0000 mg | Freq: Once | INTRAMUSCULAR | Status: AC
  Administered 2022-12-12: 4 mg via INTRAVENOUS
  Filled 2022-12-12: qty 2

## 2022-12-12 MED ORDER — NITROGLYCERIN 0.4 MG/SPRAY TL SOLN: 1.0000 | Status: DC | PRN

## 2022-12-12 NOTE — ED Triage Notes (Signed)
Pt to ED via [facility] from home. Pt reports left sided CP x4wks. Pt states today left sided CP is 10/10.Pt has been seen here and at [facility]. EMS states pt was found sleeping outside of apartments by neighbor. Pt takes 4 oxycodone's a day for chronic pain. Pt is lethargic and slurred speech. Pt wears 2L [place] chronicaly. Pt with hx COPD, CHF, afib.   Pt took '324mg'$  ASA, 4 oxycodone, 2 extra strength tylenol and 3 nitros.   BP 157/78 99%  CBG 152 HR 78

## 2022-12-12 NOTE — ED Triage Notes (Signed)
Patient reports chest pain that began approx. 3 weeks ago; This will be his 7th trip to the ED this month for the same; Patient states that he feels slightly more confused than usual (CBG 116, GCS 15)

## 2022-12-12 NOTE — Discharge Instructions (Addendum)
Please begin taking your lisinopril 10 mg 2 at a time.  Please take lisinopril 20 mg once a day until follow-up with your cardiologist

## 2022-12-12 NOTE — ED Provider Notes (Signed)
[facility] Provider Note   Event Date/Time   First MD Initiated Contact with Patient 12/12/22 1648     (approximate) History  Chest Pain (Patient reports chest pain that began approx. 3 weeks ago; This will be his 7th trip to the ED this month for the same; Patient states that he feels slightly more confused than usual (CBG 116, GCS 15))  HPI Lawrence [name] is a 67 y.o. male with a past medical history of chronic chest pain who presents for similar pain to his chronic chest pain that he describes as 10/10, crushing, substernal pain that does not radiate and is worsened with exertion.  Patient states that this is similar pain to when he had a heart attack in the past.  Patient states that he recently had a heart catheterization that was "not normal but they did not do nothing".  Patient also stating that "the only thing that works for my pain is morphine" ROS: Patient currently denies any vision changes, tinnitus, difficulty speaking, facial droop, sore throat, shortness of breath, abdominal pain, nausea/vomiting/diarrhea, dysuria, or weakness/numbness/paresthesias in any extremity   Physical Exam  Triage Vital Signs: ED Triage Vitals  Enc Vitals Group     BP 12/12/22 1639 (!) 167/100     Pulse Rate 12/12/22 1639 76     Resp 12/12/22 1639 20     Temp 12/12/22 1639 97.8 F (36.6 C)     Temp Source 12/12/22 1639 Oral     SpO2 12/12/22 1639 97 %     Weight --      Height --      Head Circumference --      Peak Flow --      Pain Score 12/12/22 1635 10     Pain Loc --      Pain Edu? --      Excl. in [place]? --    Most recent vital signs: Vitals:   12/12/22 1830 12/12/22 1900  BP: (!) 189/120 (!) 142/116  Pulse: 80 83  Resp: 16 20  Temp:    SpO2: 99% 98%   General: Awake, oriented x4. CV:  Good peripheral perfusion.  Resp:  Normal effort.  Abd:  No distention.  Other:  Elderly overweight Caucasian male laying in bed in no acute distress.  Mildly slurred  speech ED Results / Procedures / Treatments  Labs (all labs ordered are listed, but only abnormal results are displayed) Labs Reviewed  BASIC METABOLIC PANEL - Abnormal; Notable for the following components:      Result Value   Glucose, Bld 132 (*)    All other components within normal limits  URINALYSIS, ROUTINE W REFLEX MICROSCOPIC - Abnormal; Notable for the following components:   Color, Urine YELLOW (*)    APPearance CLEAR (*)    Hgb urine dipstick SMALL (*)    Protein, ur 30 (*)    Bacteria, UA RARE (*)    All other components within normal limits  CBG MONITORING, ED - Abnormal; Notable for the following components:   Glucose-Capillary 116 (*)    All other components within normal limits  CBC  TROPONIN I (HIGH SENSITIVITY)  TROPONIN I (HIGH SENSITIVITY)   EKG ED ECG REPORT I, Lawrence [name], the attending physician, personally viewed and interpreted this ECG. Date: 12/12/2022 EKG Time: 1634 Rate: 76 Rhythm: Atrial fibrillation QRS Axis: normal Intervals: normal ST/T Wave abnormalities: normal Narrative Interpretation: Atrial fibrillation.  No evidence of acute ischemia RADIOLOGY ED MD  interpretation: 2 view chest x-ray interpreted independently by me shows slightly increased interstitial prominence background of chronic coarsening which may reflect mild edema or atypical/viral infection -Agree with radiology assessment Official radiology report(s): DG Chest 2 View  Result Date: 12/12/2022 CLINICAL DATA:  Chest pain EXAM: CHEST - 2 VIEW COMPARISON:  Chest radiograph December 07, 2022 FINDINGS: Prior median sternotomy and CABG. Coronary artery stents. The heart size and mediastinal contours are within normal limits. Aortic atherosclerosis. Slightly increased interstitial prominence on a background of chronic coarsening. No focal airspace consolidation. No pleural effusion. No pneumothorax. Thoracic spondylosis. IMPRESSION: Slightly increased interstitial prominence on a  background of chronic coarsening, which may reflect mild edema or atypical/viral infection. Aortic Atherosclerosis (ICD10-I70.0). Electronically Signed   By: Lawrence [name] M.D.   On: 12/12/2022 17:23   PROCEDURES: Critical Care performed: No .1-3 Lead EKG Interpretation  Performed by: Lawrence Plummer, MD Authorized by: Lawrence Plummer, MD     Interpretation: abnormal     ECG rate:  84   ECG rate assessment: normal     Rhythm: atrial fibrillation     Ectopy: none     Conduction: normal    MEDICATIONS ORDERED IN ED: Medications  nitroGLYCERIN (NITROSTAT) SL tablet 0.4 mg (0.4 mg Sublingual Not Given 12/12/22 1813)  labetalol (NORMODYNE) injection 10 mg (10 mg Intravenous Given 12/12/22 1842)  morphine (PF) 4 MG/ML injection 4 mg (4 mg Intravenous Given 12/12/22 1842)  ondansetron (ZOFRAN) injection 4 mg (4 mg Intravenous Given 12/12/22 1900)   IMPRESSION / MDM / ASSESSMENT AND PLAN / ED COURSE  I reviewed the triage vital signs and the nursing notes.                             The patient is on the cardiac monitor to evaluate for evidence of arrhythmia and/or significant heart rate changes. Patient's presentation is most consistent with acute presentation with potential threat to life or bodily function. 67 year old male with an extensive cardiac history presents for the seventh time in the last 2 weeks for chronic chest pain Workup: ECG, CXR, CBC, BMP, Troponin Findings: ECG: No overt evidence of STEMI. No evidence of Brugadas sign, delta wave, epsilon wave, significantly prolonged QTc, or malignant arrhythmia HS Troponin: Negative x1 Other Labs unremarkable for emergent problems. CXR: Without PTX, PNA, or widened mediastinum Last Heart Catheterization:  11/02/2022 HEART Score: 5  Given History, Exam, and Workup I have low suspicion for ACS, Pneumothorax, Pneumonia, Pulmonary Embolus, Tamponade, Aortic Dissection or other emergent problem as a cause for this presentation.    Reassesment: Prior to discharge patients pain was controlled and they were well appearing.  Upon further chart review, patient had a recent heart catheterization and based on cardiology note, patient had relatively patent coronary artery disease.  Therefore, I am much more concerned of patient's blood pressure as a nidus for his pain in the chest.  Patient has remained hypertensive throughout his emergency department course and patient did state that pain improved after labetalol dosing.  Instructed patient to increase his lisinopril from 10 mg to 20 mg in an effort to better control his blood pressure on an outpatient basis.  Disposition:  Discharge. Strict return precautions discussed with patient with full understanding. Advised patient to follow up promptly with primary care provider    FINAL CLINICAL IMPRESSION(S) / ED DIAGNOSES   Final diagnoses:  Chest pain, unspecified type  Uncontrolled hypertension   Rx /  DC Orders   ED Discharge Orders     None      Note:  This document was prepared using Dragon voice recognition software and may include unintentional dictation errors.   Lawrence Plummer, MD 12/12/22 [phone]

## 2022-12-18 ENCOUNTER — Telehealth: Payer: Self-pay | Admitting: *Deleted

## 2022-12-18 NOTE — Telephone Encounter (Signed)
     Patient  visit on 12/12/2022  at [facility] was for CHEST PAIN  Have you been able to follow up with your primary care physician? Patient is stated he will see his PCP and is good with medicine and transportation  The patient was able to obtain any needed medicine or equipment.  Are there diet recommendations that you are having difficulty following?  Patient expresses understanding of discharge instructions and education provided has no other needs at this time. yes   [place] [phone] [address] , [place] [address] Email : [email or web address]

## 2022-12-22 ENCOUNTER — Other Ambulatory Visit: Payer: Self-pay | Admitting: *Deleted

## 2022-12-22 DIAGNOSIS — I10 Essential (primary) hypertension: Secondary | ICD-10-CM

## 2022-12-23 ENCOUNTER — Telehealth: Payer: Self-pay

## 2022-12-23 NOTE — Progress Notes (Signed)
   Care Guide Note  12/23/2022 Name: Lawrence [name] MRN: [id number] DOB: [birth year]  Referred by: Birdie Sons, MD Reason for referral : Care Coordination (Outreach to schedule with pharm d )   Lawrence [name] is a 67 y.o. year old male who is a primary care patient of [name], [name], MD. [name] was referred to the pharmacist for assistance related to DM.    Successful contact was made with the patient to discuss pharmacy services. Patient declines engagement at this time. Contact information was provided to the patient should they wish to reach out for assistance at a later time.  Noreene Larsson, [place], [place] [address] Direct Dial: [phone] [email or web address]

## 2022-12-26 ENCOUNTER — Encounter: Payer: Self-pay | Admitting: Oncology

## 2022-12-26 ENCOUNTER — Emergency Department: Payer: Medicare HMO

## 2022-12-26 ENCOUNTER — Other Ambulatory Visit: Payer: Self-pay

## 2022-12-26 ENCOUNTER — Other Ambulatory Visit: Payer: Medicare HMO

## 2022-12-26 ENCOUNTER — Inpatient Hospital Stay
Admission: EM | Admit: 2022-12-26 | Discharge: 2023-01-02 | DRG: 313 | Disposition: A | Discharge status: Home Health | Payer: Medicare HMO | Attending: Internal Medicine | Admitting: Internal Medicine

## 2022-12-26 DIAGNOSIS — I10 Essential (primary) hypertension: Secondary | ICD-10-CM | POA: Diagnosis not present

## 2022-12-26 DIAGNOSIS — Z8249 Family history of ischemic heart disease and other diseases of the circulatory system: Secondary | ICD-10-CM

## 2022-12-26 DIAGNOSIS — J9611 Chronic respiratory failure with hypoxia: Secondary | ICD-10-CM | POA: Diagnosis present

## 2022-12-26 DIAGNOSIS — E119 Type 2 diabetes mellitus without complications: Secondary | ICD-10-CM | POA: Diagnosis present

## 2022-12-26 DIAGNOSIS — I700 Atherosclerosis of aorta: Secondary | ICD-10-CM | POA: Diagnosis not present

## 2022-12-26 DIAGNOSIS — J939 Pneumothorax, unspecified: Secondary | ICD-10-CM | POA: Diagnosis not present

## 2022-12-26 DIAGNOSIS — J439 Emphysema, unspecified: Secondary | ICD-10-CM | POA: Diagnosis not present

## 2022-12-26 DIAGNOSIS — R0789 Other chest pain: Secondary | ICD-10-CM | POA: Diagnosis present

## 2022-12-26 DIAGNOSIS — E1121 Type 2 diabetes mellitus with diabetic nephropathy: Secondary | ICD-10-CM

## 2022-12-26 DIAGNOSIS — N179 Acute kidney failure, unspecified: Secondary | ICD-10-CM | POA: Diagnosis present

## 2022-12-26 DIAGNOSIS — Z6831 Body mass index (BMI) 31.0-31.9, adult: Secondary | ICD-10-CM

## 2022-12-26 DIAGNOSIS — E669 Obesity, unspecified: Secondary | ICD-10-CM | POA: Diagnosis present

## 2022-12-26 DIAGNOSIS — Y92410 Unspecified street and highway as the place of occurrence of the external cause: Secondary | ICD-10-CM | POA: Diagnosis not present

## 2022-12-26 DIAGNOSIS — S161XXA Strain of muscle, fascia and tendon at neck level, initial encounter: Secondary | ICD-10-CM

## 2022-12-26 DIAGNOSIS — R079 Chest pain, unspecified: Secondary | ICD-10-CM | POA: Diagnosis present

## 2022-12-26 DIAGNOSIS — R001 Bradycardia, unspecified: Secondary | ICD-10-CM

## 2022-12-26 DIAGNOSIS — E785 Hyperlipidemia, unspecified: Secondary | ICD-10-CM | POA: Diagnosis present

## 2022-12-26 DIAGNOSIS — J449 Chronic obstructive pulmonary disease, unspecified: Secondary | ICD-10-CM | POA: Diagnosis present

## 2022-12-26 DIAGNOSIS — G4733 Obstructive sleep apnea (adult) (pediatric): Secondary | ICD-10-CM | POA: Diagnosis present

## 2022-12-26 DIAGNOSIS — Z79899 Other long term (current) drug therapy: Secondary | ICD-10-CM | POA: Diagnosis not present

## 2022-12-26 DIAGNOSIS — K900 Celiac disease: Secondary | ICD-10-CM | POA: Diagnosis present

## 2022-12-26 DIAGNOSIS — J3489 Other specified disorders of nose and nasal sinuses: Secondary | ICD-10-CM | POA: Diagnosis not present

## 2022-12-26 DIAGNOSIS — Z951 Presence of aortocoronary bypass graft: Secondary | ICD-10-CM

## 2022-12-26 DIAGNOSIS — R0902 Hypoxemia: Secondary | ICD-10-CM | POA: Diagnosis not present

## 2022-12-26 DIAGNOSIS — I110 Hypertensive heart disease with heart failure: Secondary | ICD-10-CM | POA: Diagnosis present

## 2022-12-26 DIAGNOSIS — S0990XA Unspecified injury of head, initial encounter: Secondary | ICD-10-CM | POA: Diagnosis not present

## 2022-12-26 DIAGNOSIS — G8929 Other chronic pain: Secondary | ICD-10-CM | POA: Diagnosis present

## 2022-12-26 DIAGNOSIS — Z882 Allergy status to sulfonamides status: Secondary | ICD-10-CM

## 2022-12-26 DIAGNOSIS — Z825 Family history of asthma and other chronic lower respiratory diseases: Secondary | ICD-10-CM

## 2022-12-26 DIAGNOSIS — M545 Low back pain, unspecified: Secondary | ICD-10-CM | POA: Diagnosis not present

## 2022-12-26 DIAGNOSIS — I252 Old myocardial infarction: Secondary | ICD-10-CM

## 2022-12-26 DIAGNOSIS — I480 Paroxysmal atrial fibrillation: Secondary | ICD-10-CM | POA: Diagnosis present

## 2022-12-26 DIAGNOSIS — I2510 Atherosclerotic heart disease of native coronary artery without angina pectoris: Secondary | ICD-10-CM | POA: Insufficient documentation

## 2022-12-26 DIAGNOSIS — M4316 Spondylolisthesis, lumbar region: Secondary | ICD-10-CM | POA: Diagnosis present

## 2022-12-26 DIAGNOSIS — Z955 Presence of coronary angioplasty implant and graft: Secondary | ICD-10-CM

## 2022-12-26 DIAGNOSIS — Z833 Family history of diabetes mellitus: Secondary | ICD-10-CM

## 2022-12-26 DIAGNOSIS — I5032 Chronic diastolic (congestive) heart failure: Secondary | ICD-10-CM | POA: Diagnosis present

## 2022-12-26 DIAGNOSIS — R0782 Intercostal pain: Secondary | ICD-10-CM | POA: Diagnosis not present

## 2022-12-26 DIAGNOSIS — Z7901 Long term (current) use of anticoagulants: Secondary | ICD-10-CM | POA: Diagnosis not present

## 2022-12-26 DIAGNOSIS — Z818 Family history of other mental and behavioral disorders: Secondary | ICD-10-CM

## 2022-12-26 DIAGNOSIS — K219 Gastro-esophageal reflux disease without esophagitis: Secondary | ICD-10-CM | POA: Diagnosis present

## 2022-12-26 DIAGNOSIS — Z885 Allergy status to narcotic agent status: Secondary | ICD-10-CM

## 2022-12-26 DIAGNOSIS — Z888 Allergy status to other drugs, medicaments and biological substances status: Secondary | ICD-10-CM

## 2022-12-26 DIAGNOSIS — I25810 Atherosclerosis of coronary artery bypass graft(s) without angina pectoris: Secondary | ICD-10-CM | POA: Diagnosis not present

## 2022-12-26 DIAGNOSIS — S199XXA Unspecified injury of neck, initial encounter: Secondary | ICD-10-CM | POA: Diagnosis not present

## 2022-12-26 DIAGNOSIS — I5030 Unspecified diastolic (congestive) heart failure: Secondary | ICD-10-CM | POA: Diagnosis not present

## 2022-12-26 DIAGNOSIS — Z85828 Personal history of other malignant neoplasm of skin: Secondary | ICD-10-CM | POA: Diagnosis not present

## 2022-12-26 DIAGNOSIS — Z7984 Long term (current) use of oral hypoglycemic drugs: Secondary | ICD-10-CM

## 2022-12-26 DIAGNOSIS — Z743 Need for continuous supervision: Secondary | ICD-10-CM | POA: Diagnosis not present

## 2022-12-26 DIAGNOSIS — S39012A Strain of muscle, fascia and tendon of lower back, initial encounter: Secondary | ICD-10-CM | POA: Diagnosis present

## 2022-12-26 DIAGNOSIS — Z7982 Long term (current) use of aspirin: Secondary | ICD-10-CM

## 2022-12-26 DIAGNOSIS — R579 Shock, unspecified: Secondary | ICD-10-CM | POA: Diagnosis not present

## 2022-12-26 DIAGNOSIS — J441 Chronic obstructive pulmonary disease with (acute) exacerbation: Secondary | ICD-10-CM | POA: Diagnosis not present

## 2022-12-26 DIAGNOSIS — S3992XA Unspecified injury of lower back, initial encounter: Secondary | ICD-10-CM | POA: Diagnosis not present

## 2022-12-26 DIAGNOSIS — R531 Weakness: Secondary | ICD-10-CM | POA: Diagnosis not present

## 2022-12-26 DIAGNOSIS — Z820 Family history of epilepsy and other diseases of the nervous system: Secondary | ICD-10-CM

## 2022-12-26 DIAGNOSIS — F17200 Nicotine dependence, unspecified, uncomplicated: Secondary | ICD-10-CM

## 2022-12-26 DIAGNOSIS — I455 Other specified heart block: Secondary | ICD-10-CM | POA: Diagnosis not present

## 2022-12-26 DIAGNOSIS — Z7951 Long term (current) use of inhaled steroids: Secondary | ICD-10-CM

## 2022-12-26 MED ORDER — OXYCODONE-ACETAMINOPHEN 5-325 MG PO TABS: 2.0000 | ORAL_TABLET | Freq: Once | ORAL | Status: DC

## 2022-12-26 MED ORDER — HYDROMORPHONE HCL 1 MG/ML IJ SOLN
0.5000 mg | Freq: Once | INTRAMUSCULAR | Status: AC
  Administered 2022-12-26: 0.5 mg via INTRAMUSCULAR
  Filled 2022-12-26: qty 0.5

## 2022-12-26 MED ORDER — HYDROCODONE-ACETAMINOPHEN 5-325 MG PO TABS
1.0000 | ORAL_TABLET | Freq: Once | ORAL | Status: AC
  Administered 2022-12-26: 1 via ORAL
  Filled 2022-12-26: qty 1

## 2022-12-26 MED ORDER — OXYCODONE-ACETAMINOPHEN 5-325 MG PO TABS: 1.0000 | ORAL_TABLET | Freq: Once | ORAL | Status: DC

## 2022-12-26 MED ORDER — HYDROMORPHONE HCL 1 MG/ML IJ SOLN
1.0000 mg | Freq: Once | INTRAMUSCULAR | Status: AC
  Administered 2022-12-26: 1 mg via INTRAVENOUS
  Filled 2022-12-26: qty 1

## 2022-12-26 MED ORDER — KETOROLAC TROMETHAMINE 15 MG/ML IJ SOLN
15.0000 mg | Freq: Once | INTRAMUSCULAR | Status: AC
  Administered 2022-12-26: 15 mg via INTRAMUSCULAR
  Filled 2022-12-26: qty 1
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
## 2022-12-26 NOTE — ED Triage Notes (Signed)
Pt with C-Collar in place by EMS. Pt medically cleared by Dr. Jacqualine Code to go to triage.

## 2022-12-27 ENCOUNTER — Emergency Department: Payer: Medicare HMO

## 2022-12-27 DIAGNOSIS — K219 Gastro-esophageal reflux disease without esophagitis: Secondary | ICD-10-CM

## 2022-12-27 DIAGNOSIS — R079 Chest pain, unspecified: Secondary | ICD-10-CM

## 2022-12-27 DIAGNOSIS — I480 Paroxysmal atrial fibrillation: Secondary | ICD-10-CM | POA: Diagnosis not present

## 2022-12-27 DIAGNOSIS — R0782 Intercostal pain: Secondary | ICD-10-CM

## 2022-12-27 DIAGNOSIS — I2510 Atherosclerotic heart disease of native coronary artery without angina pectoris: Secondary | ICD-10-CM | POA: Insufficient documentation

## 2022-12-27 DIAGNOSIS — E119 Type 2 diabetes mellitus without complications: Secondary | ICD-10-CM | POA: Diagnosis not present

## 2022-12-27 LAB — CBC
HCT: 46.5 % (ref 39.0–52.0)
Hemoglobin: 14.5 g/dL (ref 13.0–17.0)
MCH: 27.7 pg (ref 26.0–34.0)
MCHC: 31.2 g/dL (ref 30.0–36.0)
MCV: 88.7 fL (ref 80.0–100.0)
Platelets: 214 10*3/uL (ref 150–400)
RBC: 5.24 MIL/uL (ref 4.22–5.81)
RDW: 15.5 % (ref 11.5–15.5)
WBC: 6.6 10*3/uL (ref 4.0–10.5)
nRBC: 0 % (ref 0.0–0.2)

## 2022-12-27 LAB — BASIC METABOLIC PANEL
Anion gap: 4 — ABNORMAL LOW (ref 5–15)
Anion gap: 4 — ABNORMAL LOW (ref 5–15)
BUN: 10 mg/dL (ref 8–23)
BUN: 11 mg/dL (ref 8–23)
CO2: 29 mmol/L (ref 22–32)
CO2: 29 mmol/L (ref 22–32)
Calcium: 8.8 mg/dL — ABNORMAL LOW (ref 8.9–10.3)
Calcium: 8.8 mg/dL — ABNORMAL LOW (ref 8.9–10.3)
Chloride: 104 mmol/L (ref 98–111)
Chloride: 105 mmol/L (ref 98–111)
Creatinine, Ser: 1.03 mg/dL (ref 0.61–1.24)
Creatinine, Ser: 1.1 mg/dL (ref 0.61–1.24)
GFR, Estimated: >60 mL/min (ref >60)
GFR, Estimated: >60 mL/min (ref >60)
Glucose, Bld: 137 mg/dL — ABNORMAL HIGH (ref 70–99)
Glucose, Bld: 158 mg/dL — ABNORMAL HIGH (ref 70–99)
Potassium: 4.1 mmol/L (ref 3.5–5.1)
Potassium: 4.2 mmol/L (ref 3.5–5.1)
Sodium: 137 mmol/L (ref 135–145)
Sodium: 138 mmol/L (ref 135–145)

## 2022-12-27 LAB — CBG MONITORING, ED
Glucose-Capillary: 128 mg/dL — ABNORMAL HIGH (ref 70–99)
Glucose-Capillary: 123 mg/dL — ABNORMAL HIGH (ref 70–99)

## 2022-12-27 LAB — CBC WITH DIFFERENTIAL/PLATELET
Abs Immature Granulocytes: 0.05 10*3/uL (ref 0.00–0.07)
Basophils Absolute: 0.1 10*3/uL (ref 0.0–0.1)
Basophils Relative: 1 %
Eosinophils Absolute: 0.2 10*3/uL (ref 0.0–0.5)
Eosinophils Relative: 3 %
HCT: 45.9 % (ref 39.0–52.0)
Hemoglobin: 14.4 g/dL (ref 13.0–17.0)
Immature Granulocytes: 1 %
Lymphocytes Relative: 24 %
Lymphs Abs: 1.8 10*3/uL (ref 0.7–4.0)
MCH: 27.4 pg (ref 26.0–34.0)
MCHC: 31.4 g/dL (ref 30.0–36.0)
MCV: 87.4 fL (ref 80.0–100.0)
Monocytes Absolute: 0.4 10*3/uL (ref 0.1–1.0)
Monocytes Relative: 5 %
Neutro Abs: 4.9 10*3/uL (ref 1.7–7.7)
Neutrophils Relative %: 66 %
Platelets: 220 10*3/uL (ref 150–400)
RBC: 5.25 MIL/uL (ref 4.22–5.81)
RDW: 15.3 % (ref 11.5–15.5)
WBC: 7.4 10*3/uL (ref 4.0–10.5)
nRBC: 0 % (ref 0.0–0.2)

## 2022-12-27 LAB — TROPONIN I (HIGH SENSITIVITY)
Troponin I (High Sensitivity): 10 ng/L (ref <18)
Troponin I (High Sensitivity): 12 ng/L (ref <18)

## 2022-12-27 LAB — GLUCOSE, CAPILLARY: Glucose-Capillary: 116 mg/dL — ABNORMAL HIGH (ref 70–99)

## 2022-12-27 MED ORDER — MAGNESIUM HYDROXIDE 400 MG/5ML PO SUSP: 30.0000 mL | Freq: Every day | ORAL | Status: DC | PRN

## 2022-12-27 MED ORDER — METHOCARBAMOL 500 MG PO TABS
500.0000 mg | ORAL_TABLET | Freq: Three times a day (TID) | ORAL | Status: DC
  Administered 2022-12-27 (×3): 500 mg via ORAL
  Filled 2022-12-27 (×4): qty 1

## 2022-12-27 MED ORDER — ALBUTEROL SULFATE (2.5 MG/3ML) 0.083% IN NEBU: 2.5000 mg | INHALATION_SOLUTION | RESPIRATORY_TRACT | Status: DC | PRN

## 2022-12-27 MED ORDER — ASPIRIN 81 MG PO CHEW
81.0000 mg | CHEWABLE_TABLET | Freq: Every day | ORAL | Status: DC
  Administered 2022-12-27 – 2023-01-02 (×7): 81 mg via ORAL
  Filled 2022-12-27 (×7): qty 1

## 2022-12-27 MED ORDER — APIXABAN 5 MG PO TABS
5.0000 mg | ORAL_TABLET | Freq: Two times a day (BID) | ORAL | Status: DC
  Administered 2022-12-27 – 2023-01-02 (×13): 5 mg via ORAL
  Filled 2022-12-27 (×13): qty 1

## 2022-12-27 MED ORDER — ONDANSETRON HCL 4 MG PO TABS
4.0000 mg | ORAL_TABLET | Freq: Four times a day (QID) | ORAL | Status: DC | PRN
  Filled 2022-12-27: qty 1

## 2022-12-27 MED ORDER — MECLIZINE HCL 25 MG PO TABS
25.0000 mg | ORAL_TABLET | Freq: Three times a day (TID) | ORAL | Status: DC
  Administered 2022-12-27 (×3): 25 mg via ORAL
  Filled 2022-12-27 (×3): qty 1

## 2022-12-27 MED ORDER — METOPROLOL SUCCINATE ER 50 MG PO TB24: 50.0000 mg | ORAL_TABLET | Freq: Every day | ORAL | Status: DC

## 2022-12-27 MED ORDER — SODIUM CHLORIDE 0.9 % IV SOLN: INTRAVENOUS | Status: DC

## 2022-12-27 MED ORDER — ALLOPURINOL 100 MG PO TABS
300.0000 mg | ORAL_TABLET | Freq: Two times a day (BID) | ORAL | Status: DC
  Administered 2022-12-27 – 2023-01-02 (×13): 300 mg via ORAL
  Filled 2022-12-27 (×2): qty 1
  Filled 2022-12-27: qty 3
  Filled 2022-12-27 (×2): qty 1
  Filled 2022-12-27 (×4): qty 3
  Filled 2022-12-27 (×3): qty 1
  Filled 2022-12-27 (×2): qty 3

## 2022-12-27 MED ORDER — GABAPENTIN 300 MG PO CAPS
300.0000 mg | ORAL_CAPSULE | Freq: Three times a day (TID) | ORAL | Status: DC
  Administered 2022-12-27 (×3): 300 mg via ORAL
  Filled 2022-12-27 (×3): qty 1

## 2022-12-27 MED ORDER — TORSEMIDE 20 MG PO TABS
50.0000 mg | ORAL_TABLET | Freq: Every day | ORAL | Status: DC
  Administered 2022-12-27 – 2022-12-28 (×2): 50 mg via ORAL
  Filled 2022-12-27 (×2): qty 3

## 2022-12-27 MED ORDER — METHOCARBAMOL 500 MG PO TABS: 500.0000 mg | ORAL_TABLET | Freq: Three times a day (TID) | ORAL | Status: DC | PRN

## 2022-12-27 MED ORDER — TRAZODONE HCL 50 MG PO TABS
150.0000 mg | ORAL_TABLET | Freq: Every day | ORAL | Status: DC
  Administered 2022-12-27: 150 mg via ORAL
  Filled 2022-12-27: qty 1

## 2022-12-27 MED ORDER — KETAMINE HCL 50 MG/5ML IJ SOSY
20.0000 mg | PREFILLED_SYRINGE | Freq: Once | INTRAMUSCULAR | Status: AC
  Administered 2022-12-27: 20 mg via INTRAVENOUS
  Filled 2022-12-27: qty 5

## 2022-12-27 MED ORDER — ACETAMINOPHEN 325 MG PO TABS
650.0000 mg | ORAL_TABLET | Freq: Four times a day (QID) | ORAL | Status: DC | PRN
  Administered 2022-12-27: 650 mg via ORAL
  Filled 2022-12-27: qty 2

## 2022-12-27 MED ORDER — RANOLAZINE ER 500 MG PO TB12
1000.0000 mg | ORAL_TABLET | Freq: Two times a day (BID) | ORAL | Status: DC
  Administered 2022-12-27: 1000 mg via ORAL
  Filled 2022-12-27 (×4): qty 2

## 2022-12-27 MED ORDER — NITROGLYCERIN 0.4 MG SL SUBL: 0.4000 mg | SUBLINGUAL_TABLET | SUBLINGUAL | Status: DC | PRN

## 2022-12-27 MED ORDER — KETOROLAC TROMETHAMINE 15 MG/ML IJ SOLN
15.0000 mg | Freq: Four times a day (QID) | INTRAMUSCULAR | Status: DC | PRN
  Administered 2022-12-27 (×2): 15 mg via INTRAVENOUS
  Filled 2022-12-27 (×3): qty 1

## 2022-12-27 MED ORDER — HYDROMORPHONE HCL 1 MG/ML IJ SOLN
1.0000 mg | INTRAMUSCULAR | Status: DC | PRN
  Administered 2022-12-27 – 2022-12-29 (×6): 1 mg via INTRAVENOUS
  Filled 2022-12-27 (×6): qty 1

## 2022-12-27 MED ORDER — VENLAFAXINE HCL ER 75 MG PO CP24
75.0000 mg | ORAL_CAPSULE | Freq: Every day | ORAL | Status: DC
  Administered 2022-12-29 – 2023-01-02 (×5): 75 mg via ORAL
  Filled 2022-12-27 (×6): qty 1

## 2022-12-27 MED ORDER — ALBUTEROL SULFATE HFA 108 (90 BASE) MCG/ACT IN AERS: 2.0000 | INHALATION_SPRAY | RESPIRATORY_TRACT | Status: DC | PRN

## 2022-12-27 MED ORDER — TRAZODONE HCL 50 MG PO TABS
25.0000 mg | ORAL_TABLET | Freq: Every evening | ORAL | Status: DC | PRN
  Administered 2022-12-30: 25 mg via ORAL
  Filled 2022-12-27: qty 1

## 2022-12-27 MED ORDER — ACETAMINOPHEN 650 MG RE SUPP: 650.0000 mg | Freq: Four times a day (QID) | RECTAL | Status: DC | PRN

## 2022-12-27 MED ORDER — ONDANSETRON HCL 4 MG/2ML IJ SOLN
4.0000 mg | Freq: Once | INTRAMUSCULAR | Status: AC
  Administered 2022-12-27: 4 mg via INTRAVENOUS
  Filled 2022-12-27: qty 2

## 2022-12-27 MED ORDER — ONDANSETRON HCL 4 MG/2ML IJ SOLN
4.0000 mg | Freq: Four times a day (QID) | INTRAMUSCULAR | Status: DC | PRN
  Administered 2022-12-27 – 2022-12-30 (×4): 4 mg via INTRAVENOUS
  Filled 2022-12-27 (×4): qty 2

## 2022-12-27 MED ORDER — ALPRAZOLAM 0.5 MG PO TABS
1.0000 mg | ORAL_TABLET | Freq: Three times a day (TID) | ORAL | Status: DC
  Administered 2022-12-27 – 2023-01-02 (×16): 1 mg via ORAL
  Filled 2022-12-27 (×17): qty 2

## 2022-12-27 MED ORDER — ISOSORBIDE MONONITRATE ER 60 MG PO TB24
60.0000 mg | ORAL_TABLET | Freq: Every day | ORAL | Status: DC
  Administered 2022-12-28 – 2023-01-02 (×6): 60 mg via ORAL
  Filled 2022-12-27 (×6): qty 1

## 2022-12-27 MED ORDER — AMIODARONE HCL 200 MG PO TABS
200.0000 mg | ORAL_TABLET | Freq: Two times a day (BID) | ORAL | Status: DC
  Administered 2022-12-27: 200 mg via ORAL
  Filled 2022-12-27 (×2): qty 1

## 2022-12-27 MED ORDER — LISINOPRIL 5 MG PO TABS
10.0000 mg | ORAL_TABLET | Freq: Every day | ORAL | Status: DC
  Administered 2022-12-27 – 2022-12-28 (×2): 10 mg via ORAL
  Filled 2022-12-27: qty 1
  Filled 2022-12-27 (×2): qty 2

## 2022-12-27 MED ORDER — LINACLOTIDE 72 MCG PO CAPS
72.0000 ug | ORAL_CAPSULE | Freq: Every day | ORAL | Status: DC
  Administered 2022-12-29 – 2023-01-02 (×5): 72 ug via ORAL
  Filled 2022-12-27 (×7): qty 1

## 2022-12-27 MED ORDER — SUVOREXANT 5 MG PO TABS: 5.0000 mg | ORAL_TABLET | Freq: Every evening | ORAL | Status: DC | PRN

## 2022-12-27 MED ORDER — NALOXONE HCL 4 MG/0.1ML NA LIQD
1.0000 | Freq: Once | NASAL | Status: AC | PRN
  Administered 2022-12-28: 1 via NASAL
  Filled 2022-12-27: qty 8

## 2022-12-27 MED ORDER — TIOTROPIUM BROMIDE MONOHYDRATE 18 MCG IN CAPS
18.0000 ug | ORAL_CAPSULE | Freq: Every day | RESPIRATORY_TRACT | Status: DC
  Administered 2022-12-27 – 2023-01-02 (×6): 18 ug via RESPIRATORY_TRACT
  Filled 2022-12-27: qty 5

## 2022-12-27 MED ORDER — INSULIN ASPART 100 UNIT/ML IJ SOLN: 0.0000 [IU] | Freq: Every day | INTRAMUSCULAR | Status: DC

## 2022-12-27 MED ORDER — OMEGA-3-ACID ETHYL ESTERS 1 G PO CAPS
4.0000 | ORAL_CAPSULE | Freq: Every day | ORAL | Status: DC
  Administered 2022-12-27 – 2023-01-02 (×6): 4 g via ORAL
  Filled 2022-12-27 (×6): qty 4

## 2022-12-27 MED ORDER — ATORVASTATIN CALCIUM 80 MG PO TABS
80.0000 mg | ORAL_TABLET | Freq: Every day | ORAL | Status: DC
  Administered 2022-12-27 – 2023-01-02 (×7): 80 mg via ORAL
  Filled 2022-12-27: qty 4
  Filled 2022-12-27: qty 1
  Filled 2022-12-27 (×2): qty 4
  Filled 2022-12-27: qty 1
  Filled 2022-12-27: qty 4
  Filled 2022-12-27: qty 1

## 2022-12-27 MED ORDER — DAPAGLIFLOZIN PROPANEDIOL 10 MG PO TABS
10.0000 mg | ORAL_TABLET | Freq: Every day | ORAL | Status: DC
  Administered 2022-12-27 – 2023-01-02 (×7): 10 mg via ORAL
  Filled 2022-12-27 (×7): qty 1

## 2022-12-27 MED ORDER — DILTIAZEM HCL ER COATED BEADS 180 MG PO CP24
180.0000 mg | ORAL_CAPSULE | Freq: Every day | ORAL | Status: DC
  Administered 2022-12-27: 180 mg via ORAL
  Filled 2022-12-27: qty 1

## 2022-12-27 MED ORDER — INSULIN ASPART 100 UNIT/ML IJ SOLN
0.0000 [IU] | Freq: Three times a day (TID) | INTRAMUSCULAR | Status: DC
  Administered 2022-12-27 – 2022-12-28 (×4): 2 [IU] via SUBCUTANEOUS
  Administered 2022-12-29: 3 [IU] via SUBCUTANEOUS
  Administered 2022-12-29 – 2022-12-30 (×4): 2 [IU] via SUBCUTANEOUS
  Administered 2022-12-30: 3 [IU] via SUBCUTANEOUS
  Administered 2022-12-31 – 2023-01-01 (×4): 2 [IU] via SUBCUTANEOUS
  Administered 2023-01-02: 3 [IU] via SUBCUTANEOUS
  Filled 2022-12-27 (×15): qty 1

## 2022-12-27 NOTE — Assessment & Plan Note (Signed)
-   We will continue Eliquis and amiodarone.

## 2022-12-27 NOTE — Assessment & Plan Note (Signed)
-   Pain management will be provided for his back pain and neck and leg pain. - Will utilize muscle relaxant if needed.
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
## 2022-12-27 NOTE — Assessment & Plan Note (Signed)
>>  ASSESSMENT AND PLAN FOR TYPE 2 DIABETES MELLITUS WITHOUT COMPLICATIONS WRITTEN ON 12/27/2022  4:37 AM BY MANSY, JAN A, MD  - We will continue his Marcelline Deist and hold off metformin. - He will be placed on supplemental coverage with NovoLog.

## 2022-12-27 NOTE — Assessment & Plan Note (Signed)
-   We will continue his albuterol and Spiriva.

## 2022-12-27 NOTE — Assessment & Plan Note (Signed)
-   We will continue his antihypertensives. 

## 2022-12-27 NOTE — Assessment & Plan Note (Addendum)
-   The patient will be admitted to an observation cardiac telemetry bed. - Will follow serial troponins and EKGs. - The patient will be placed on aspirin as well as p.r.n. sublingual nitroglycerin and Dilaudid for pain. - We will obtain a cardiology consult in a.m. for further cardiac risk stratification. - I notified Dr. Clayborn Bigness about the patient.

## 2022-12-27 NOTE — ED Notes (Signed)
Pt transported to room 31 by this RN in wheelchair. Pt placed on cardiac, BP and pulse ox monitoring. Receiving RN made aware of pt in room.

## 2022-12-27 NOTE — ED Notes (Signed)
Advised nurse that patient has ready bed 
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
## 2022-12-27 NOTE — Assessment & Plan Note (Signed)
-   We will continue his aspirin, statin therapy, Ranexa and Zestril as well as Toprol-XL.

## 2022-12-27 NOTE — Assessment & Plan Note (Signed)
-   We will continue his Wilder Glade and hold off metformin. - He will be placed on supplemental coverage with NovoLog.
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
## 2022-12-27 NOTE — Assessment & Plan Note (Signed)
>>  ASSESSMENT AND PLAN FOR PAROXYSMAL ATRIAL FIBRILLATION WRITTEN ON 12/27/2022  4:37 AM BY MANSY, JAN A, MD  - We will continue Eliquis and amiodarone.

## 2022-12-27 NOTE — Assessment & Plan Note (Signed)
>>  ASSESSMENT AND PLAN FOR ESSENTIAL HYPERTENSION WRITTEN ON 12/27/2022  4:38 AM BY MANSY, JAN A, MD  - We will continue his antihypertensives.

## 2022-12-27 NOTE — Assessment & Plan Note (Signed)
-   We will continue his PPI therapy.

## 2022-12-28 ENCOUNTER — Other Ambulatory Visit: Payer: Self-pay

## 2022-12-28 ENCOUNTER — Observation Stay: Payer: Medicare HMO

## 2022-12-28 DIAGNOSIS — R079 Chest pain, unspecified: Secondary | ICD-10-CM | POA: Diagnosis present

## 2022-12-28 DIAGNOSIS — Z6831 Body mass index (BMI) 31.0-31.9, adult: Secondary | ICD-10-CM | POA: Diagnosis not present

## 2022-12-28 DIAGNOSIS — M4316 Spondylolisthesis, lumbar region: Secondary | ICD-10-CM | POA: Diagnosis present

## 2022-12-28 DIAGNOSIS — R0789 Other chest pain: Secondary | ICD-10-CM | POA: Diagnosis present

## 2022-12-28 DIAGNOSIS — S39012A Strain of muscle, fascia and tendon of lower back, initial encounter: Secondary | ICD-10-CM | POA: Diagnosis present

## 2022-12-28 DIAGNOSIS — I480 Paroxysmal atrial fibrillation: Secondary | ICD-10-CM | POA: Diagnosis present

## 2022-12-28 DIAGNOSIS — J9611 Chronic respiratory failure with hypoxia: Secondary | ICD-10-CM | POA: Diagnosis present

## 2022-12-28 DIAGNOSIS — Z85828 Personal history of other malignant neoplasm of skin: Secondary | ICD-10-CM | POA: Diagnosis not present

## 2022-12-28 DIAGNOSIS — R0902 Hypoxemia: Secondary | ICD-10-CM | POA: Diagnosis not present

## 2022-12-28 DIAGNOSIS — N179 Acute kidney failure, unspecified: Secondary | ICD-10-CM | POA: Diagnosis present

## 2022-12-28 DIAGNOSIS — R0782 Intercostal pain: Secondary | ICD-10-CM | POA: Diagnosis not present

## 2022-12-28 DIAGNOSIS — Z79899 Other long term (current) drug therapy: Secondary | ICD-10-CM | POA: Diagnosis not present

## 2022-12-28 DIAGNOSIS — R001 Bradycardia, unspecified: Secondary | ICD-10-CM

## 2022-12-28 DIAGNOSIS — I2510 Atherosclerotic heart disease of native coronary artery without angina pectoris: Secondary | ICD-10-CM | POA: Diagnosis present

## 2022-12-28 DIAGNOSIS — I5032 Chronic diastolic (congestive) heart failure: Secondary | ICD-10-CM | POA: Diagnosis present

## 2022-12-28 DIAGNOSIS — E119 Type 2 diabetes mellitus without complications: Secondary | ICD-10-CM | POA: Diagnosis present

## 2022-12-28 DIAGNOSIS — E785 Hyperlipidemia, unspecified: Secondary | ICD-10-CM | POA: Diagnosis present

## 2022-12-28 DIAGNOSIS — G4733 Obstructive sleep apnea (adult) (pediatric): Secondary | ICD-10-CM | POA: Diagnosis present

## 2022-12-28 DIAGNOSIS — Z951 Presence of aortocoronary bypass graft: Secondary | ICD-10-CM | POA: Diagnosis not present

## 2022-12-28 DIAGNOSIS — S161XXA Strain of muscle, fascia and tendon at neck level, initial encounter: Secondary | ICD-10-CM | POA: Diagnosis present

## 2022-12-28 DIAGNOSIS — I110 Hypertensive heart disease with heart failure: Secondary | ICD-10-CM | POA: Diagnosis present

## 2022-12-28 DIAGNOSIS — Y92410 Unspecified street and highway as the place of occurrence of the external cause: Secondary | ICD-10-CM | POA: Diagnosis not present

## 2022-12-28 DIAGNOSIS — J441 Chronic obstructive pulmonary disease with (acute) exacerbation: Secondary | ICD-10-CM | POA: Diagnosis not present

## 2022-12-28 DIAGNOSIS — Z7901 Long term (current) use of anticoagulants: Secondary | ICD-10-CM | POA: Diagnosis not present

## 2022-12-28 DIAGNOSIS — J449 Chronic obstructive pulmonary disease, unspecified: Secondary | ICD-10-CM | POA: Diagnosis present

## 2022-12-28 DIAGNOSIS — E669 Obesity, unspecified: Secondary | ICD-10-CM | POA: Diagnosis present

## 2022-12-28 DIAGNOSIS — G8929 Other chronic pain: Secondary | ICD-10-CM | POA: Diagnosis present

## 2022-12-28 DIAGNOSIS — R579 Shock, unspecified: Secondary | ICD-10-CM

## 2022-12-28 DIAGNOSIS — K219 Gastro-esophageal reflux disease without esophagitis: Secondary | ICD-10-CM | POA: Diagnosis present

## 2022-12-28 LAB — GLUCOSE, CAPILLARY
Glucose-Capillary: 110 mg/dL — ABNORMAL HIGH (ref 70–99)
Glucose-Capillary: 117 mg/dL — ABNORMAL HIGH (ref 70–99)
Glucose-Capillary: 120 mg/dL — ABNORMAL HIGH (ref 70–99)
Glucose-Capillary: 134 mg/dL — ABNORMAL HIGH (ref 70–99)
Glucose-Capillary: 136 mg/dL — ABNORMAL HIGH (ref 70–99)
Glucose-Capillary: 157 mg/dL — ABNORMAL HIGH (ref 70–99)

## 2022-12-28 LAB — BASIC METABOLIC PANEL
Anion gap: 6 (ref 5–15)
BUN: 17 mg/dL (ref 8–23)
CO2: 31 mmol/L (ref 22–32)
Calcium: 8.5 mg/dL — ABNORMAL LOW (ref 8.9–10.3)
Chloride: 102 mmol/L (ref 98–111)
Creatinine, Ser: 1.37 mg/dL — ABNORMAL HIGH (ref 0.61–1.24)
GFR, Estimated: 56 mL/min — ABNORMAL LOW (ref >60)
Glucose, Bld: 114 mg/dL — ABNORMAL HIGH (ref 70–99)
Potassium: 3.7 mmol/L (ref 3.5–5.1)
Sodium: 139 mmol/L (ref 135–145)

## 2022-12-28 LAB — D-DIMER, QUANTITATIVE: D-Dimer, Quant: 0.33 ug/mL-FEU (ref 0.00–0.50)

## 2022-12-28 LAB — BLOOD GAS, ARTERIAL
Acid-Base Excess: 4.9 mmol/L — ABNORMAL HIGH (ref 0.0–2.0)
Bicarbonate: 32.6 mmol/L — ABNORMAL HIGH (ref 20.0–28.0)
O2 Content: 2 L/min
O2 Saturation: 99.5 %
Patient temperature: 37
pCO2 arterial: 59 mmHg — ABNORMAL HIGH (ref 32–48)
pH, Arterial: 7.35 (ref 7.35–7.45)
pO2, Arterial: 113 mmHg — ABNORMAL HIGH (ref 83–108)

## 2022-12-28 LAB — TSH: TSH: 1.035 u[IU]/mL (ref 0.350–4.500)

## 2022-12-28 LAB — MRSA NEXT GEN BY PCR, NASAL: MRSA by PCR Next Gen: NOT DETECTED

## 2022-12-28 LAB — TROPONIN I (HIGH SENSITIVITY): Troponin I (High Sensitivity): 10 ng/L (ref <18)

## 2022-12-28 LAB — BRAIN NATRIURETIC PEPTIDE: B Natriuretic Peptide: 215 pg/mL — ABNORMAL HIGH (ref 0.0–100.0)

## 2022-12-28 LAB — MAGNESIUM: Magnesium: 2.2 mg/dL (ref 1.7–2.4)

## 2022-12-28 MED ORDER — LEVALBUTEROL HCL 1.25 MG/0.5ML IN NEBU
1.2500 mg | INHALATION_SOLUTION | Freq: Four times a day (QID) | RESPIRATORY_TRACT | Status: DC
  Administered 2022-12-28 – 2022-12-30 (×7): 1.25 mg via RESPIRATORY_TRACT
  Filled 2022-12-28 (×7): qty 0.5

## 2022-12-28 MED ORDER — ATROPINE SULFATE 1 MG/10ML IJ SOSY
PREFILLED_SYRINGE | INTRAMUSCULAR | Status: AC
  Filled 2022-12-28: qty 10

## 2022-12-28 MED ORDER — NOREPINEPHRINE 4 MG/250ML-% IV SOLN
2.0000 ug/min | INTRAVENOUS | Status: DC
  Administered 2022-12-28: 2 ug/min via INTRAVENOUS

## 2022-12-28 MED ORDER — POTASSIUM CHLORIDE CRYS ER 20 MEQ PO TBCR
40.0000 meq | EXTENDED_RELEASE_TABLET | Freq: Once | ORAL | Status: AC
  Administered 2022-12-28: 40 meq via ORAL
  Filled 2022-12-28: qty 2

## 2022-12-28 MED ORDER — SODIUM CHLORIDE 0.9 % IV BOLUS
500.0000 mL | Freq: Once | INTRAVENOUS | Status: AC
  Administered 2022-12-28: 500 mL via INTRAVENOUS

## 2022-12-28 MED ORDER — GABAPENTIN 300 MG PO CAPS
300.0000 mg | ORAL_CAPSULE | Freq: Two times a day (BID) | ORAL | Status: DC
  Administered 2022-12-28 (×2): 300 mg via ORAL
  Filled 2022-12-28 (×3): qty 1

## 2022-12-28 MED ORDER — ATROPINE SULFATE 1 MG/10ML IJ SOSY
1.0000 mg | PREFILLED_SYRINGE | Freq: Once | INTRAMUSCULAR | Status: AC
  Administered 2022-12-28: 1 mg via INTRAVENOUS

## 2022-12-28 MED ORDER — GLUCAGON HCL RDNA (DIAGNOSTIC) 1 MG IJ SOLR
INTRAMUSCULAR | Status: AC
  Filled 2022-12-28: qty 1

## 2022-12-28 MED ORDER — GLUCAGON HCL RDNA (DIAGNOSTIC) 1 MG IJ SOLR: 1.0000 mg | Freq: Once | INTRAMUSCULAR | Status: DC | PRN

## 2022-12-28 MED ORDER — CHLORHEXIDINE GLUCONATE CLOTH 2 % EX PADS
6.0000 | MEDICATED_PAD | Freq: Every day | CUTANEOUS | Status: DC
  Administered 2022-12-28 – 2022-12-30 (×3): 6 via TOPICAL

## 2022-12-28 MED ORDER — OXYCODONE HCL 5 MG PO TABS
5.0000 mg | ORAL_TABLET | ORAL | Status: DC | PRN
  Administered 2022-12-29 (×2): 10 mg via ORAL
  Administered 2022-12-29: 5 mg via ORAL
  Administered 2022-12-30 (×2): 10 mg via ORAL
  Administered 2022-12-30: 5 mg via ORAL
  Administered 2022-12-30 – 2022-12-31 (×7): 10 mg via ORAL
  Filled 2022-12-28: qty 2
  Filled 2022-12-28: qty 1
  Filled 2022-12-28 (×6): qty 2
  Filled 2022-12-28: qty 1
  Filled 2022-12-28 (×4): qty 2

## 2022-12-28 MED ORDER — SODIUM CHLORIDE 0.9 % IV SOLN
250.0000 mL | INTRAVENOUS | Status: DC
  Administered 2022-12-28: 250 mL via INTRAVENOUS

## 2022-12-28 MED ORDER — NOREPINEPHRINE 4 MG/250ML-% IV SOLN
INTRAVENOUS | Status: AC
  Filled 2022-12-28: qty 250

## 2022-12-28 NOTE — Progress Notes (Signed)
OT Cancellation Note  Patient Details Name: Lawrence [name] MRN: [id number] DOB: 1956-03-31   Cancelled Treatment:    Reason Eval/Treat Not Completed: Medical issues which prohibited therapy;Other (comment) (Pt not stable; sign off until stable.) Pt with change in status; transferred to ICU. Will sign off for OT. Please re-consult when pt is medically stable for therapy participation.  Vania Rea 12/28/2022, 8:42 AM
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
## 2022-12-28 NOTE — Progress Notes (Signed)
Critical care note:  Date of note: 12/28/2022  Subjective: Rapid response was called as the patient's heart rate was dropping to the 20s, as low as 24 and for 2 to 3-second pauses on telemetry with initial normal blood pressure of 112/82 however the patient became hypotensive later on with a BP of 62/47 1 rapid response team was called.  The patient received trazodone, Xanax, Neurontin and Robaxin as well as Antivert around 10:30 PM.  Pulsoxymeter was 90% and he was placed on 2 L of O2 by nasal cannula with adequate O2 saturations.  He denied any chest pain or palpitations.  He denied any nausea or vomiting.  No recent fever or chills.  No cough or wheezing or hemoptysis.  Objective: Physical examination: Generally: Acutely ill elderly Caucasian male in no acute respiratory distress.  He was somnolent but arousable and responsive to his name and cooperative with exam. Vital signs: As above later on BP was 76/47 with a heart rate of 39 with a temperature of 97.8 and pulse symmetry of 96% on 2 L of O2 by nasal cannula. Head - atraumatic, normocephalic.  Pupils - equal, round and reactive to light and accommodation. Extraocular movements are intact. No scleral icterus.  Oropharynx - moist mucous membranes and tongue. No pharyngeal erythema or exudate.  Neck - supple. No JVD. Carotid pulses 2+ bilaterally. No carotid bruits. No palpable thyromegaly or lymphadenopathy. Cardiovascular - regular rate and bradycardic rhythm. Normal S1 and S2. No murmurs, gallops or rubs.  Lungs - clear to auscultation bilaterally.  Abdomen - soft and nontender. Positive bowel sounds. No palpable organomegaly or masses.  Extremities - no pitting edema, clubbing or cyanosis.  Neuro - grossly non-focal. Skin - no rashes. GU and rectal exam - deferred.  Labs and notes were reviewed. EKG showed slow atrial fibrillation with a rate of 50 with nonspecific IVCD and Q waves inferiorly.  Stat portable chest x-ray was reviewed  by myself and revealed no acute cardiopulmonary disease.  Stat labs revealed a BMP with a creatinine of 1.37 and GFR of 56 below previous levels.  BNP came back to 15.  High sensitive troponin I came back 10.  D-dimer was ordered and came back 0.33 and TSH was 1.035.  The patient's blood glucose was 114.  Assessment/plan: 1.  Symptomatic bradycardia with slow atrial fibrillation and associated subsequent hypotension. - This like secondary to polypharmacy.  The patient beta-blocker therapy as well as calcium channel blocker therapy and amiodarone were held off. - The patient was given a bolus of IV normal saline with improvement of his blood pressure. - Cardiology consult and 2D echo will be obtained.  2.  Shock. - The patient required IV pressor therapy with IV Levophed after lack of response from IV fluids. - Multiple sedatives will be held off for now.  PCCM consult was obtained and the team will assume the care.  Authorized and performed by: Eugenie Norrie, MD Total critical care time: Approximately   30    minutes. Due to a high probability of clinically significant, life-threatening deterioration, the patient required my highest level of preparedness to intervene emergently and I personally spent this critical care time directly and personally managing the patient.  This critical care time included obtaining a history, examining the patient, pulse oximetry, ordering and review of studies, arranging urgent treatment with development of management plan, evaluation of patient's response to treatment, frequent reassessment, and discussions with other providers. This critical care time was performed to assess  and manage the high probability of imminent, life-threatening deterioration that could result in multiorgan failure.  It was exclusive of separately billable procedures and treating other patients and teaching time.
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
## 2022-12-28 NOTE — Progress Notes (Signed)
An USGPIV (ultrasound guided PIV) has been placed for short-term vasopressor infusion. A correctly placed ivWatch must be used when administering Vasopressors. Should this treatment be needed beyond 72 hours, central line access should be obtained.  It will be the responsibility of the bedside nurse to follow best practice to prevent extravasations.   

## 2022-12-28 NOTE — Progress Notes (Signed)
Patient upgraded to the ICU after a rapid called on the patient for bradycardia, hypotension, and patient hard to arouse. [place] NP notified prior to rapid response about the bradycardia, hypotension, and some cardiac pauses that the patient was having on the telemetry monitor.
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
## 2022-12-28 NOTE — Progress Notes (Signed)
[place] NP notified that patient has some lower back swelling. No skin change noted at this time.
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
## 2022-12-28 NOTE — Progress Notes (Signed)
2214 CCMD called due to patient having pauses on the monitor. The pause was 3.07 on the monitor at 2205 and 2212 it was 2.27 [place] NP notified.   [place] NP notified about cardiac pauses and list sent per request: 3.50 @2234$ , 1841 2.24, 18.44 2.01, 18.47 2.29, 1852 2.19, 1851 2.80, 1859 2.65, 1903 2.35, 1909 2.16, 1915 2.13, 1918 2.44, 1921 2.43, 1924 2.61, 1927 2.99, 1931 2.33, 1934 2.31, 1940 2.10, 1943 2.35, 1946 2.19, 1949 2.49, 1953 2.21, 1956 2.38, 2002 2.16, 2011 2.33, 2014 2.17, 2017 2.08, 2021 2.48, 2032 2.38, 2033 2.31 2036 3.40, 2040 2.05, 2043 2.42, 2046 2.76, 2050 2.12, 2053 2.21, 2056 2.64, 2059 2.35, 2002 2.35, 2109 2.38, 2112 2.43, 2115 2.03, 2119 2.09, 2125 2.89.

## 2022-12-28 NOTE — Progress Notes (Signed)
Notified NP's of heart rate in the 20's to 30's no new orders, will continue to monitor.

## 2022-12-28 NOTE — Progress Notes (Signed)
PT Cancellation Note  Patient Details Name: Lawrence [name] MRN: [id number] DOB: 1955-12-14   Cancelled Treatment:    Reason Eval/Treat Not Completed: Medical issues which prohibited therapy.  Pt transferred to CCU 12/28/22.  D/t pt transferring to higher level of care, per PT protocol require new PT consult in order to continue therapy (will discontinue current PT order d/t this).  Please re-consult PT when pt is medically appropriate to participate in PT.   Leitha Bleak, PT 12/28/22, 8:49 AM

## 2022-12-28 NOTE — Progress Notes (Signed)
Gave Narcan at 0525, no significant physical response, but heart rate and blood pressure have improved, and I was able to stop levo.

## 2022-12-29 DIAGNOSIS — R0782 Intercostal pain: Secondary | ICD-10-CM | POA: Diagnosis not present

## 2022-12-29 LAB — GLUCOSE, CAPILLARY
Glucose-Capillary: 145 mg/dL — ABNORMAL HIGH (ref 70–99)
Glucose-Capillary: 170 mg/dL — ABNORMAL HIGH (ref 70–99)
Glucose-Capillary: 136 mg/dL — ABNORMAL HIGH (ref 70–99)
Glucose-Capillary: 177 mg/dL — ABNORMAL HIGH (ref 70–99)

## 2022-12-29 MED ORDER — FLUTICASONE FUROATE-VILANTEROL 100-25 MCG/ACT IN AEPB
1.0000 | INHALATION_SPRAY | Freq: Every day | RESPIRATORY_TRACT | Status: DC
  Administered 2022-12-29 – 2023-01-02 (×5): 1 via RESPIRATORY_TRACT
  Filled 2022-12-29: qty 28

## 2022-12-29 MED ORDER — HYDROMORPHONE HCL 1 MG/ML IJ SOLN
0.5000 mg | INTRAMUSCULAR | Status: DC | PRN
  Administered 2022-12-29 – 2022-12-30 (×4): 0.5 mg via INTRAVENOUS
  Filled 2022-12-29 (×4): qty 1

## 2022-12-29 MED ORDER — GABAPENTIN 300 MG PO CAPS
300.0000 mg | ORAL_CAPSULE | Freq: Three times a day (TID) | ORAL | Status: DC
  Administered 2022-12-29 – 2022-12-31 (×8): 300 mg via ORAL
  Filled 2022-12-29 (×7): qty 1
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
## 2022-12-30 ENCOUNTER — Encounter: Payer: Self-pay | Admitting: Internal Medicine

## 2022-12-30 DIAGNOSIS — R0782 Intercostal pain: Secondary | ICD-10-CM | POA: Diagnosis not present

## 2022-12-30 DIAGNOSIS — I480 Paroxysmal atrial fibrillation: Secondary | ICD-10-CM | POA: Diagnosis not present

## 2022-12-30 LAB — GLUCOSE, CAPILLARY
Glucose-Capillary: 131 mg/dL — ABNORMAL HIGH (ref 70–99)
Glucose-Capillary: 143 mg/dL — ABNORMAL HIGH (ref 70–99)
Glucose-Capillary: 152 mg/dL — ABNORMAL HIGH (ref 70–99)
Glucose-Capillary: 154 mg/dL — ABNORMAL HIGH (ref 70–99)

## 2022-12-30 MED ORDER — LEVALBUTEROL HCL 1.25 MG/0.5ML IN NEBU: 1.2500 mg | INHALATION_SOLUTION | Freq: Four times a day (QID) | RESPIRATORY_TRACT | Status: DC | PRN

## 2022-12-30 MED ORDER — METOPROLOL SUCCINATE ER 50 MG PO TB24
50.0000 mg | ORAL_TABLET | Freq: Every day | ORAL | Status: DC
  Administered 2022-12-30 – 2022-12-31 (×2): 50 mg via ORAL
  Filled 2022-12-30 (×2): qty 1

## 2022-12-30 NOTE — Progress Notes (Signed)
Report to [facility].  Transported to 2A and released in stable condition. No needs voiced currently
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
## 2022-12-31 DIAGNOSIS — R001 Bradycardia, unspecified: Secondary | ICD-10-CM | POA: Diagnosis not present

## 2022-12-31 DIAGNOSIS — I480 Paroxysmal atrial fibrillation: Secondary | ICD-10-CM | POA: Diagnosis not present

## 2022-12-31 LAB — GLUCOSE, CAPILLARY
Glucose-Capillary: 119 mg/dL — ABNORMAL HIGH (ref 70–99)
Glucose-Capillary: 122 mg/dL — ABNORMAL HIGH (ref 70–99)
Glucose-Capillary: 133 mg/dL — ABNORMAL HIGH (ref 70–99)
Glucose-Capillary: 166 mg/dL — ABNORMAL HIGH (ref 70–99)

## 2022-12-31 MED ORDER — FUROSEMIDE 10 MG/ML IJ SOLN: 40.0000 mg | Freq: Two times a day (BID) | INTRAMUSCULAR | Status: DC

## 2022-12-31 MED ORDER — GABAPENTIN 300 MG PO CAPS
300.0000 mg | ORAL_CAPSULE | Freq: Two times a day (BID) | ORAL | Status: DC
  Administered 2023-01-01 – 2023-01-02 (×3): 300 mg via ORAL
  Filled 2022-12-31 (×3): qty 1

## 2022-12-31 NOTE — Care Management Important Message (Addendum)
Important Message  Patient Details  Name: Lawrence [name] MRN: [id number] Date of Birth: [birth year]   Medicare Important Message Given:  Yes  Patient asleep upon time of visit, copy of Medicare IM left in room for reference.   Dannette Barbara 12/31/2022, 2:57 PM
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
## 2023-01-01 LAB — GLUCOSE, CAPILLARY
Glucose-Capillary: 116 mg/dL — ABNORMAL HIGH (ref 70–99)
Glucose-Capillary: 133 mg/dL — ABNORMAL HIGH (ref 70–99)
Glucose-Capillary: 135 mg/dL — ABNORMAL HIGH (ref 70–99)
Glucose-Capillary: 186 mg/dL — ABNORMAL HIGH (ref 70–99)
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
## 2023-01-01 NOTE — TOC Transition Note (Addendum)
Transition of Care [facility]) - CM/SW Discharge Note   Patient Details  Name: Lawrence [name] MRN: [id number] Date of Birth: [birth year]  Transition of Care [facility]) CM/SW Contact:  Tiburcio Bash, LCSW Phone Number: 01/01/2023, 11:16 AM   Clinical Narrative:     Patient to dc home today with [facility] services RN and PT [place], [place] with [place] informed of dc today.   Patient is currently on baseline 2L O2, has home O2 through [place].   No further dc needs identified.    Final next level of care: Home w Home Health Services Barriers to Discharge: No Barriers Identified   Patient Goals and CMS Choice CMS Medicare.gov Compare Post Acute Care list provided to:: Patient Choice offered to / list presented to : Patient  Discharge Placement                         Discharge Plan and Services Additional resources added to the After Visit Summary for     Discharge Planning Services: CM Consult            DME Arranged: N/A         HH Arranged: PT, RN [place] Agency: [place] Date [place]: 01/01/23 Time [place]: [id number] Representative spoke with at [place]: [place]  Social Determinants of Health ([place]) Interventions SDOH Screenings   Food Insecurity: No Food Insecurity (10/31/2022)  Housing: Low Risk  (10/31/2022)  Transportation Needs: No Transportation Needs (10/31/2022)  Utilities: Not At Risk (10/31/2022)  Alcohol Screen: Low Risk  (09/10/2022)  Depression (PHQ2-9): Low Risk  (09/10/2022)  Financial Resource Strain: Low Risk  (01/08/2022)  Physical Activity: Insufficiently Active (01/08/2022)  Social Connections: Moderately Isolated (01/08/2022)  Stress: No Stress Concern Present (01/08/2022)  Tobacco Use: Medium Risk (12/30/2022)     Readmission Risk Interventions     No data to display

## 2023-01-01 NOTE — Progress Notes (Signed)
Informed by nursing staff that patient is unable to discharge d/t no ride and not having his home O2.   Spoke with Hulan Amato with care management at [facility]. Informed her that patient is medically stable for discharge but does not have his home O2 to get home with and no family members/contacts are able to drive to be able to bring O2 to the hospital. Hulan Amato to return my call as soon as possible.

## 2023-01-01 NOTE — Care Management (Signed)
This RNCM received inbound call from [place] with [facility], requesting assistance with portable home oxygen and transportation home.   This RNCM spoke with Ovid Curd at [place] after hours [phone], who will have oxygen delivered to bedside. Ovid Curd does not have an ETA. Ovid Curd reports a driver should deliver within 1-2 hours, if home oxygen has not be delivered, contact the [place] after hours [phone].  This RNCM notified [place] of [employer] able to deliver, however no ETA. [facility] reports [employer] service has declined to take the patient home and no family member will pick the patient up. [facility] inquiring about other available transportation. This RNCM is unaware of other transportation services within [place] area. This RNCM coordinated with other TOC team members and noone aware of options for [facility] service area.
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
## 2023-01-01 NOTE — Progress Notes (Incomplete)
{  Select_TRH_Note:[id number]} 

## 2023-01-02 LAB — GLUCOSE, CAPILLARY: Glucose-Capillary: 156 mg/dL — ABNORMAL HIGH (ref 70–99)

## 2023-01-02 NOTE — Plan of Care (Signed)
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
## 2023-01-02 NOTE — Plan of Care (Signed)
Problem: Education: Goal: Ability to describe self-care measures that may prevent or decrease complications (Diabetes Survival Skills Education) will improve 01/02/2023 1129 by Verl Dicker, RN Outcome: Adequate for Discharge 01/02/2023 1031 by Verl Dicker, RN Outcome: Progressing Goal: Individualized Educational Video(s) 01/02/2023 1129 by Verl Dicker, RN Outcome: Adequate for Discharge 01/02/2023 1031 by Verl Dicker, RN Outcome: Progressing   Problem: Coping: Goal: Ability to adjust to condition or change in health will improve 01/02/2023 1129 by Verl Dicker, RN Outcome: Adequate for Discharge 01/02/2023 1031 by Verl Dicker, RN Outcome: Progressing   Problem: Fluid Volume: Goal: Ability to maintain a balanced intake and output will improve 01/02/2023 1129 by Verl Dicker, RN Outcome: Adequate for Discharge 01/02/2023 1031 by Verl Dicker, RN Outcome: Progressing   Problem: Health Behavior/Discharge Planning: Goal: Ability to identify and utilize available resources and services will improve 01/02/2023 1129 by Verl Dicker, RN Outcome: Adequate for Discharge 01/02/2023 1031 by Verl Dicker, RN Outcome: Progressing Goal: Ability to manage health-related needs will improve 01/02/2023 1129 by Verl Dicker, RN Outcome: Adequate for Discharge 01/02/2023 1031 by Verl Dicker, RN Outcome: Progressing   Problem: Metabolic: Goal: Ability to maintain appropriate glucose levels will improve 01/02/2023 1129 by Verl Dicker, RN Outcome: Adequate for Discharge 01/02/2023 1031 by Verl Dicker, RN Outcome: Progressing   Problem: Nutritional: Goal: Maintenance of adequate nutrition will improve 01/02/2023 1129 by Verl Dicker, RN Outcome: Adequate for Discharge 01/02/2023 1031 by Verl Dicker, RN Outcome: Progressing Goal: Progress toward achieving an optimal weight will improve 01/02/2023 1129 by Verl Dicker, RN Outcome: Adequate for Discharge 01/02/2023 1031 by Verl Dicker, RN Outcome: Progressing   Problem: Skin Integrity: Goal: Risk for impaired skin integrity will decrease 01/02/2023 1129 by Verl Dicker, RN Outcome: Adequate for Discharge 01/02/2023 1031 by Verl Dicker, RN Outcome: Progressing   Problem: Tissue Perfusion: Goal: Adequacy of tissue perfusion will improve 01/02/2023 1129 by Verl Dicker, RN Outcome: Adequate for Discharge 01/02/2023 1031 by Verl Dicker, RN Outcome: Progressing   Problem: Education: Goal: Knowledge of General Education information will improve Description: Including pain rating scale, medication(s)/side effects and non-pharmacologic comfort measures 01/02/2023 1129 by Verl Dicker, RN Outcome: Adequate for Discharge 01/02/2023 1031 by Verl Dicker, RN Outcome: Progressing   Problem: Health Behavior/Discharge Planning: Goal: Ability to manage health-related needs will improve 01/02/2023 1129 by Verl Dicker, RN Outcome: Adequate for Discharge 01/02/2023 1031 by Verl Dicker, RN Outcome: Progressing   Problem: Clinical Measurements: Goal: Ability to maintain clinical measurements within normal limits will improve 01/02/2023 1129 by Verl Dicker, RN Outcome: Adequate for Discharge 01/02/2023 1031 by Verl Dicker, RN Outcome: Progressing Goal: Will remain free from infection 01/02/2023 1129 by Verl Dicker, RN Outcome: Adequate for Discharge 01/02/2023 1031 by Verl Dicker, RN Outcome: Progressing Goal: Diagnostic test results will improve 01/02/2023 1129 by Verl Dicker, RN Outcome: Adequate for Discharge 01/02/2023 1031 by Verl Dicker, RN Outcome: Progressing Goal: Respiratory complications will improve 01/02/2023 1129 by Verl Dicker, RN Outcome: Adequate for Discharge 01/02/2023 1031 by Verl Dicker, RN Outcome: Progressing Goal: Cardiovascular  complication will be avoided 01/02/2023 1129 by Verl Dicker, RN Outcome: Adequate for Discharge 01/02/2023 1031 by Verl Dicker, RN Outcome: Progressing   Problem: Activity: Goal: Risk for activity intolerance will decrease 01/02/2023 1129 by Verl Dicker, RN Outcome: Adequate for Discharge  01/02/2023 1031 by Verl Dicker, RN Outcome: Progressing   Problem: Nutrition: Goal: Adequate nutrition will be maintained 01/02/2023 1129 by Verl Dicker, RN Outcome: Adequate for Discharge 01/02/2023 1031 by Verl Dicker, RN Outcome: Progressing   Problem: Coping: Goal: Level of anxiety will decrease 01/02/2023 1129 by Verl Dicker, RN Outcome: Adequate for Discharge 01/02/2023 1031 by Verl Dicker, RN Outcome: Progressing   Problem: Elimination: Goal: Will not experience complications related to bowel motility 01/02/2023 1129 by Verl Dicker, RN Outcome: Adequate for Discharge 01/02/2023 1031 by Verl Dicker, RN Outcome: Progressing Goal: Will not experience complications related to urinary retention 01/02/2023 1129 by Verl Dicker, RN Outcome: Adequate for Discharge 01/02/2023 1031 by Verl Dicker, RN Outcome: Progressing   Problem: Pain Managment: Goal: General experience of comfort will improve 01/02/2023 1129 by Verl Dicker, RN Outcome: Adequate for Discharge 01/02/2023 1031 by Verl Dicker, RN Outcome: Progressing   Problem: Safety: Goal: Ability to remain free from injury will improve 01/02/2023 1129 by Verl Dicker, RN Outcome: Adequate for Discharge 01/02/2023 1031 by Verl Dicker, RN Outcome: Progressing   Problem: Skin Integrity: Goal: Risk for impaired skin integrity will decrease 01/02/2023 1129 by Verl Dicker, RN Outcome: Adequate for Discharge 01/02/2023 1031 by Verl Dicker, RN Outcome: Progressing

## 2023-01-04 ENCOUNTER — Other Ambulatory Visit: Payer: Self-pay | Admitting: Family Medicine

## 2023-01-04 ENCOUNTER — Encounter: Payer: Self-pay | Admitting: Oncology

## 2023-01-04 ENCOUNTER — Telehealth: Payer: Self-pay

## 2023-01-04 ENCOUNTER — Telehealth: Payer: Self-pay | Admitting: *Deleted

## 2023-01-04 DIAGNOSIS — M5410 Radiculopathy, site unspecified: Secondary | ICD-10-CM

## 2023-01-04 DIAGNOSIS — F419 Anxiety disorder, unspecified: Secondary | ICD-10-CM
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
## 2023-01-04 NOTE — Progress Notes (Signed)
  Care Coordination  Note  01/04/2023 Name: CONRADO KROTZER MRN: [id number] DOB: 1956/09/11  OMARIE GILBRETH is a 67 y.o. year old primary care patient of [name], [name], MD.   Follow up plan: Hospital Follow Up appointment scheduled with Dr. [name] on 01/15/23 at 10:40 AM.  [place]  Direct Dial: [phone]

## 2023-01-04 NOTE — Telephone Encounter (Unsigned)
Copied from [place] [phone]. Topic: General - Other >> Jan 04, 2023  5:39 PM Lawrence [name] wrote: Reason for CRM: Medication Refill - Medication: oxyCODONE-acetaminophen (PERCOCET) 10-325 MG tablet [id number]  ALPRAZolam (XANAX) 1 MG tablet [id number]  Has the patient contacted their pharmacy? Yes.   (Agent: If no, request that the patient contact the pharmacy for the refill. If patient does not wish to contact the pharmacy document the reason why and proceed with request.) (Agent: If yes, when and what did the pharmacy advise?)  Preferred Pharmacy (with phone number or street name): [place], [place] [address] [place] [address] Phone: [phone] Fax: [phone] Hours: Not open 24 hours   Has the patient been seen for an appointment in the last year OR does the patient have an upcoming appointment? Yes.    Agent: Please be advised that RX refills may take up to 3 business days. We ask that you follow-up with your pharmacy.

## 2023-01-04 NOTE — Telephone Encounter (Signed)
Copied from [place] [phone]. Topic: General - Other >> Jan 04, 2023  2:34 PM Lesle Chris wrote: [id number] for CRM: Loree Fee from [facility] called in says received fax 11/06/22 about oxygen , says they need to know if the oxygen flow for patient is, 3 liters per minute continous or nocturnal. Please call back. Fx also is

## 2023-01-05 ENCOUNTER — Telehealth: Payer: Self-pay | Admitting: *Deleted

## 2023-01-05 NOTE — Telephone Encounter (Signed)
3 lpm continuous

## 2023-01-05 NOTE — Telephone Encounter (Signed)
   Telephone encounter was:  Successful.  01/05/2023 Name: TYRAN CORDES MRN: [id number] DOB: [birth year]  ZORAIZ NEGLEY is a 67 y.o. year old male who is a primary care patient of [name], [name], MD . The community resource team was consulted for assistance with Transportation Needs  and [place] guide performed the following interventions: Patient provided with information about care guide support team and interviewed to confirm resource needs.  Has a rental car and declined transportation provided him with insurance information for the future , [employer] will not let me refer the patient has to call so they just call the customer service number on their card , I did finally get through to the meal plan and they claimed they would reach out tot he patient to verify the info I provided  Follow Up Plan:  No further follow up planned at this time. The patient has been provided with needed resources. [place] [phone] [address] , [place] [address] Email : [email or web address]

## 2023-01-06 MED ORDER — ALPRAZOLAM 1 MG PO TABS: 1.0000 mg | ORAL_TABLET | Freq: Three times a day (TID) | ORAL | 4 refills | Status: DC

## 2023-01-06 NOTE — Telephone Encounter (Signed)
Prescription faxed today to [facility]. [phone].

## 2023-01-06 NOTE — Telephone Encounter (Signed)
Lawrence [name] at [facility] advised as below. She is requesting written order to be faxed to [phone]. Advised order would be faxed on 01/08/23 as Dr. [name] will be out of the office on 01/07/23.

## 2023-01-06 NOTE — Telephone Encounter (Signed)
Requested medication (s) are due for refill today: no to both  Requested medication (s) are on the active medication list: yes  Last refill:  Xanax: 09/11/22 #90 4 RF          Percocet: 01/05/23   Future visit scheduled: yes  Notes to clinic:  neither med delegated to NT to refill or refuse. Both the scripts were filled and Xanax should have refills.    Requested Prescriptions  Pending Prescriptions Disp Refills   ALPRAZolam (XANAX) 1 MG tablet 90 tablet 4    Sig: Take 1 tablet (1 mg total) by mouth 3 (three) times daily.     Not Delegated - Psychiatry: Anxiolytics/Hypnotics 2 Failed - 01/05/2023  7:37 AM      Failed - This refill cannot be delegated      Passed - Urine Drug Screen completed in last 360 days      Passed - Patient is not pregnant      Passed - Valid encounter within last 6 months    Recent Outpatient Visits           3 months ago Chronic respiratory failure with hypoxia [facility])   [place] Mikey Kirschner, PA-C   5 months ago [place], Donald E, MD   7 months ago Toe pain, left   [facility] Tally Joe T, FNP   9 months ago Type 2 diabetes mellitus with diabetic nephropathy, without long-term current use of insulin ([place])   [place], Donald E, MD   1 year ago OSA (obstructive sleep apnea)   [place], DO       Future Appointments             In 1 week [name], [name], MD [facility], [name]             oxyCODONE-acetaminophen (PERCOCET) 10-325 MG tablet 180 tablet 0    Sig: Take 1 tablet by mouth every 4 (four) hours as needed. for pain     Not Delegated - Analgesics:  Opioid Agonist Combinations Failed - 01/05/2023  7:37 AM      Failed - This refill cannot be delegated      Failed - Valid encounter within last 3 months    Recent  Outpatient Visits           3 months ago Chronic respiratory failure with hypoxia [facility])   [place] Mikey Kirschner, PA-C   5 months ago [place], Donald E, MD   7 months ago Toe pain, left   [facility] Tally Joe T, FNP   9 months ago Type 2 diabetes mellitus with diabetic nephropathy, without long-term current use of insulin [facility])   [place], Donald E, MD   1 year ago OSA (obstructive sleep apnea)   [place], DO       Future Appointments             In 1 week [name], [name], MD [facility], [name]            Passed - Urine Drug Screen completed in last 360 days

## 2023-01-11 ENCOUNTER — Telehealth: Payer: Self-pay

## 2023-01-11 DIAGNOSIS — J4489 Other specified chronic obstructive pulmonary disease: Secondary | ICD-10-CM | POA: Diagnosis not present

## 2023-01-11 DIAGNOSIS — I130 Hypertensive heart and chronic kidney disease with heart failure and stage 1 through stage 4 chronic kidney disease, or unspecified chronic kidney disease: Secondary | ICD-10-CM | POA: Diagnosis not present

## 2023-01-11 DIAGNOSIS — D631 Anemia in chronic kidney disease: Secondary | ICD-10-CM | POA: Diagnosis not present

## 2023-01-11 DIAGNOSIS — I5023 Acute on chronic systolic (congestive) heart failure: Secondary | ICD-10-CM | POA: Diagnosis not present

## 2023-01-11 DIAGNOSIS — N182 Chronic kidney disease, stage 2 (mild): Secondary | ICD-10-CM | POA: Diagnosis not present

## 2023-01-11 DIAGNOSIS — I5032 Chronic diastolic (congestive) heart failure: Secondary | ICD-10-CM | POA: Diagnosis not present

## 2023-01-11 DIAGNOSIS — E1122 Type 2 diabetes mellitus with diabetic chronic kidney disease: Secondary | ICD-10-CM | POA: Diagnosis not present

## 2023-01-11 DIAGNOSIS — I482 Chronic atrial fibrillation, unspecified: Secondary | ICD-10-CM | POA: Diagnosis not present

## 2023-01-11 DIAGNOSIS — I700 Atherosclerosis of aorta: Secondary | ICD-10-CM | POA: Diagnosis not present

## 2023-01-11 NOTE — Telephone Encounter (Signed)
I called pt again to complete AWV (x3 total). Pt answers and indicates he is out and cannot complete. Pt asked to be r/s. I r/s'd to 03/10/23 @ 3pm.

## 2023-01-13 ENCOUNTER — Telehealth: Payer: Self-pay | Admitting: Family Medicine

## 2023-01-13 ENCOUNTER — Ambulatory Visit: Payer: Self-pay | Admitting: *Deleted

## 2023-01-13 ENCOUNTER — Emergency Department
Admission: EM | Admit: 2023-01-13 | Discharge: 2023-01-14 | Disposition: A | Discharge status: Home / Self Care | Payer: Medicare HMO | Attending: Emergency Medicine | Admitting: Emergency Medicine

## 2023-01-13 ENCOUNTER — Emergency Department: Payer: Medicare HMO

## 2023-01-13 DIAGNOSIS — I509 Heart failure, unspecified: Secondary | ICD-10-CM | POA: Insufficient documentation

## 2023-01-13 DIAGNOSIS — I110 Hypertensive heart disease with heart failure: Secondary | ICD-10-CM | POA: Insufficient documentation

## 2023-01-13 DIAGNOSIS — I10 Essential (primary) hypertension: Secondary | ICD-10-CM | POA: Diagnosis not present

## 2023-01-13 DIAGNOSIS — M542 Cervicalgia: Secondary | ICD-10-CM | POA: Insufficient documentation

## 2023-01-13 DIAGNOSIS — G8929 Other chronic pain: Secondary | ICD-10-CM | POA: Diagnosis not present

## 2023-01-13 DIAGNOSIS — Z79899 Other long term (current) drug therapy: Secondary | ICD-10-CM | POA: Diagnosis not present

## 2023-01-13 DIAGNOSIS — M549 Dorsalgia, unspecified: Secondary | ICD-10-CM | POA: Diagnosis not present

## 2023-01-13 DIAGNOSIS — I2510 Atherosclerotic heart disease of native coronary artery without angina pectoris: Secondary | ICD-10-CM | POA: Diagnosis not present

## 2023-01-13 DIAGNOSIS — R000 Tachycardia, unspecified: Secondary | ICD-10-CM | POA: Diagnosis not present

## 2023-01-13 DIAGNOSIS — R0789 Other chest pain: Secondary | ICD-10-CM | POA: Diagnosis not present

## 2023-01-13 DIAGNOSIS — M545 Low back pain, unspecified: Secondary | ICD-10-CM | POA: Diagnosis not present

## 2023-01-13 DIAGNOSIS — R079 Chest pain, unspecified: Secondary | ICD-10-CM | POA: Diagnosis not present

## 2023-01-13 LAB — CBC
HCT: 53.1 % — ABNORMAL HIGH (ref 39.0–52.0)
Hemoglobin: 16.7 g/dL (ref 13.0–17.0)
MCH: 27.7 pg (ref 26.0–34.0)
MCHC: 31.5 g/dL (ref 30.0–36.0)
MCV: 88.1 fL (ref 80.0–100.0)
Platelets: 173 10*3/uL (ref 150–400)
RBC: 6.03 MIL/uL — ABNORMAL HIGH (ref 4.22–5.81)
RDW: 15.5 % (ref 11.5–15.5)
WBC: 9.6 10*3/uL (ref 4.0–10.5)
nRBC: 0 % (ref 0.0–0.2)

## 2023-01-13 LAB — TROPONIN I (HIGH SENSITIVITY)
Troponin I (High Sensitivity): 15 ng/L (ref <18)
Troponin I (High Sensitivity): 19 ng/L — ABNORMAL HIGH (ref <18)

## 2023-01-13 LAB — BASIC METABOLIC PANEL
Anion gap: 9 (ref 5–15)
BUN: 10 mg/dL (ref 8–23)
CO2: 27 mmol/L (ref 22–32)
Calcium: 9.2 mg/dL (ref 8.9–10.3)
Chloride: 101 mmol/L (ref 98–111)
Creatinine, Ser: 0.92 mg/dL (ref 0.61–1.24)
GFR, Estimated: >60 mL/min (ref >60)
Glucose, Bld: 147 mg/dL — ABNORMAL HIGH (ref 70–99)
Potassium: 3.8 mmol/L (ref 3.5–5.1)
Sodium: 137 mmol/L (ref 135–145)

## 2023-01-13 MED ORDER — AMLODIPINE BESYLATE 5 MG PO TABS
10.0000 mg | ORAL_TABLET | Freq: Once | ORAL | Status: AC
  Administered 2023-01-13: 10 mg via ORAL
  Filled 2023-01-13: qty 2

## 2023-01-13 MED ORDER — LABETALOL HCL 5 MG/ML IV SOLN
10.0000 mg | Freq: Once | INTRAVENOUS | Status: AC
  Administered 2023-01-13: 10 mg via INTRAVENOUS
  Filled 2023-01-13: qty 4

## 2023-01-13 MED ORDER — HYDRALAZINE HCL 20 MG/ML IJ SOLN
10.0000 mg | Freq: Once | INTRAMUSCULAR | Status: AC
  Administered 2023-01-13: 10 mg via INTRAVENOUS
  Filled 2023-01-13: qty 1

## 2023-01-13 MED ORDER — HYDROMORPHONE HCL 1 MG/ML IJ SOLN
1.0000 mg | Freq: Once | INTRAMUSCULAR | Status: AC
  Administered 2023-01-13: 1 mg via INTRAVENOUS
  Filled 2023-01-13: qty 1

## 2023-01-13 MED ORDER — CLONIDINE HCL 0.1 MG PO TABS
0.1000 mg | ORAL_TABLET | Freq: Once | ORAL | Status: AC
  Administered 2023-01-13: 0.1 mg via ORAL
  Filled 2023-01-13: qty 1

## 2023-01-13 NOTE — Telephone Encounter (Signed)
Pt returned call, note from Dr. [name] read. Pt states he can barely get to a phone without falling. States he is going to the ED. States "A nurse" advise him to do so.
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
## 2023-01-13 NOTE — Telephone Encounter (Signed)
That's fine

## 2023-01-13 NOTE — Discharge Instructions (Addendum)
Please follow-up with your doctor tomorrow regarding today's ER visit and your hypertension.  Please continue to take your medications as prescribed by your doctor.  Return to the emergency department for any symptoms personally concerning to yourself.
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
## 2023-01-13 NOTE — ED Notes (Signed)
Pt here via [facility] with cp since this morning. Pt took 3 doses of his nitro with no relief. 324 of asa and 2 sprays of nitro given by ems with no relief. Hx of COPD, HTN, and a-fib.   109-118 95% RA 174/117   20G RFA

## 2023-01-13 NOTE — ED Notes (Signed)
Pt reports that he usually is discharged with EMS transport because he is O2 dependent.

## 2023-01-14 DIAGNOSIS — R531 Weakness: Secondary | ICD-10-CM | POA: Diagnosis not present

## 2023-01-14 DIAGNOSIS — Z743 Need for continuous supervision: Secondary | ICD-10-CM | POA: Diagnosis not present

## 2023-01-14 DIAGNOSIS — I10 Essential (primary) hypertension: Secondary | ICD-10-CM | POA: Diagnosis not present
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
## 2023-01-15 ENCOUNTER — Inpatient Hospital Stay: Payer: Medicare HMO | Admitting: Family Medicine

## 2023-01-15 DIAGNOSIS — D631 Anemia in chronic kidney disease: Secondary | ICD-10-CM | POA: Diagnosis not present

## 2023-01-15 DIAGNOSIS — E1122 Type 2 diabetes mellitus with diabetic chronic kidney disease: Secondary | ICD-10-CM | POA: Diagnosis not present

## 2023-01-15 DIAGNOSIS — I700 Atherosclerosis of aorta: Secondary | ICD-10-CM | POA: Diagnosis not present

## 2023-01-15 DIAGNOSIS — I5032 Chronic diastolic (congestive) heart failure: Secondary | ICD-10-CM | POA: Diagnosis not present

## 2023-01-15 DIAGNOSIS — N182 Chronic kidney disease, stage 2 (mild): Secondary | ICD-10-CM | POA: Diagnosis not present

## 2023-01-15 DIAGNOSIS — I130 Hypertensive heart and chronic kidney disease with heart failure and stage 1 through stage 4 chronic kidney disease, or unspecified chronic kidney disease: Secondary | ICD-10-CM | POA: Diagnosis not present

## 2023-01-15 DIAGNOSIS — I482 Chronic atrial fibrillation, unspecified: Secondary | ICD-10-CM | POA: Diagnosis not present

## 2023-01-15 DIAGNOSIS — J4489 Other specified chronic obstructive pulmonary disease: Secondary | ICD-10-CM | POA: Diagnosis not present

## 2023-01-15 DIAGNOSIS — I5023 Acute on chronic systolic (congestive) heart failure: Secondary | ICD-10-CM | POA: Diagnosis not present

## 2023-01-18 ENCOUNTER — Emergency Department: Payer: Medicare HMO

## 2023-01-18 ENCOUNTER — Telehealth: Payer: Self-pay | Admitting: Family Medicine

## 2023-01-18 ENCOUNTER — Telehealth: Payer: Self-pay

## 2023-01-18 ENCOUNTER — Other Ambulatory Visit: Payer: Self-pay

## 2023-01-18 ENCOUNTER — Emergency Department
Admission: EM | Admit: 2023-01-18 | Discharge: 2023-01-18 | Disposition: A | Discharge status: Home / Self Care | Payer: Medicare HMO | Attending: Emergency Medicine | Admitting: Emergency Medicine

## 2023-01-18 DIAGNOSIS — M545 Low back pain, unspecified: Secondary | ICD-10-CM | POA: Diagnosis not present

## 2023-01-18 DIAGNOSIS — R69 Illness, unspecified: Secondary | ICD-10-CM | POA: Diagnosis not present

## 2023-01-18 DIAGNOSIS — Z7901 Long term (current) use of anticoagulants: Secondary | ICD-10-CM | POA: Insufficient documentation

## 2023-01-18 DIAGNOSIS — W182XXA Fall in (into) shower or empty bathtub, initial encounter: Secondary | ICD-10-CM | POA: Insufficient documentation

## 2023-01-18 DIAGNOSIS — I6782 Cerebral ischemia: Secondary | ICD-10-CM | POA: Insufficient documentation

## 2023-01-18 DIAGNOSIS — M542 Cervicalgia: Secondary | ICD-10-CM | POA: Diagnosis not present

## 2023-01-18 DIAGNOSIS — Y92002 Bathroom of unspecified non-institutional (private) residence single-family (private) house as the place of occurrence of the external cause: Secondary | ICD-10-CM | POA: Insufficient documentation

## 2023-01-18 DIAGNOSIS — S3992XA Unspecified injury of lower back, initial encounter: Secondary | ICD-10-CM | POA: Diagnosis not present

## 2023-01-18 DIAGNOSIS — R0789 Other chest pain: Secondary | ICD-10-CM | POA: Diagnosis not present

## 2023-01-18 DIAGNOSIS — T1490XA Injury, unspecified, initial encounter: Secondary | ICD-10-CM | POA: Diagnosis not present

## 2023-01-18 DIAGNOSIS — Z743 Need for continuous supervision: Secondary | ICD-10-CM | POA: Diagnosis not present

## 2023-01-18 DIAGNOSIS — M549 Dorsalgia, unspecified: Secondary | ICD-10-CM | POA: Insufficient documentation

## 2023-01-18 DIAGNOSIS — W19XXXA Unspecified fall, initial encounter: Secondary | ICD-10-CM

## 2023-01-18 DIAGNOSIS — Z043 Encounter for examination and observation following other accident: Secondary | ICD-10-CM | POA: Diagnosis not present

## 2023-01-18 DIAGNOSIS — M8588 Other specified disorders of bone density and structure, other site: Secondary | ICD-10-CM | POA: Diagnosis not present

## 2023-01-18 DIAGNOSIS — S199XXA Unspecified injury of neck, initial encounter: Secondary | ICD-10-CM | POA: Diagnosis not present

## 2023-01-18 MED ORDER — HYDROMORPHONE HCL 1 MG/ML IJ SOLN
1.0000 mg | Freq: Once | INTRAMUSCULAR | Status: AC
  Administered 2023-01-18: 1 mg via INTRAVENOUS
  Filled 2023-01-18: qty 1

## 2023-01-18 NOTE — ED Provider Notes (Addendum)
[facility] Provider Note    Event Date/Time   First MD Initiated Contact with Patient 01/18/23 2022     (approximate)  History   Chief Complaint: Back Pain and Fall  HPI  Lawrence [name] is a 67 y.o. male with a past medical history of chronic pain on oxycodone at home, COPD, gastric reflux, atrial fibrillation on Eliquis who presents emergency department after a fall.  According to the patient he has chronic neck and back pain takes oxycodone at home.  Tonight around 7 PM had a fall in his shower.  Patient states he hit his head but does not believe he passed out.  Patient is on Eliquis.  Patient states since the fall he has had increased back pain.  Denies any weakness or numbness.  No incontinence.  Physical Exam   Triage Vital Signs: ED Triage Vitals  Enc Vitals Group     BP 01/18/23 2016 (!) 166/100     Pulse Rate 01/18/23 2016 93     Resp 01/18/23 2016 17     Temp 01/18/23 2016 98.8 F (37.1 C)     Temp Source 01/18/23 2016 Oral     SpO2 01/18/23 2013 97 %     Weight 01/18/23 2017 196 lb 10.4 oz (89.2 kg)     Height 01/18/23 2017 '5\' 7"'$  (1.702 m)     Head Circumference --      Peak Flow --      Pain Score 01/18/23 2016 10     Pain Loc --      Pain Edu? --      Excl. in [place]? --     Most recent vital signs: Vitals:   01/18/23 2016 01/18/23 2030  BP: (!) 166/100 (!) 141/86  Pulse: 93 97  Resp: 17 20  Temp: 98.8 F (37.1 C)   SpO2: 97% 97%    General: Awake, no distress.  CV:  Good peripheral perfusion.  Regular rate and rhythm  Resp:  Normal effort.  Equal breath sounds bilaterally.  Abd:  No distention.  Soft, nontender.  No rebound or guarding.  RADIOLOGY  I have reviewed and interpreted CT head images.  No large bleed seen on my evaluation. Radiology has read the CT scan of the head and is negative.  Cervical spine CT negative. Thoracic spine x-ray shows no acute fracture but does show moderate degenerative changes.  Lumbar spine  x-ray shows possible sacrococcygeal fracture  MEDICATIONS ORDERED IN ED: Medications  HYDROmorphone (DILAUDID) injection 1 mg (has no administration in time range)     IMPRESSION / MDM / ASSESSMENT AND PLAN / ED COURSE  I reviewed the triage vital signs and the nursing notes.  Patient's presentation is most consistent with acute presentation with potential threat to life or bodily function.  Patient presents emergency department for increase back pain after a fall tonight.  Patient has a history of chronic back pain, fell in the shower.  We will dose pain medication we will obtain CT imaging the head and C-spine as precaution as well as x-rays of the thoracic and lumbar spine.  We will continue to closely monitor while awaiting imaging results.  Patient agreeable to plan of care.  CT scan of the head and C-spine show no acute finding.  Thoracic x-ray shows no acute finding.  Patient's lumbar x-ray shows no lumbar finding but possible sacrococcygeal fracture.  Patient states some pain in his tailbone but states that has been bothering him  for months.  Denies any acute worsening.  Will have the patient follow-up with his doctor for recheck within the next week.  Patient agreeable to plan.  FINAL CLINICAL IMPRESSION(S) / ED DIAGNOSES   Fall Acute on chronic back pain   Note:  This document was prepared using Dragon voice recognition software and may include unintentional dictation errors.   Harvest Dark, MD 01/18/23 2239    Harvest Dark, MD 01/18/23 2250
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
## 2023-01-18 NOTE — ED Notes (Signed)
Patient wears 3L of oxygen at baseline. Patient reports tripping an falling while trying to get out of the shower.

## 2023-01-18 NOTE — Telephone Encounter (Signed)
[place] for verbal orders.    Eulis Foster, MD  [facility]

## 2023-01-18 NOTE — ED Notes (Signed)
Patient is back from CT.

## 2023-01-18 NOTE — ED Notes (Signed)
Called [facility] for transport back home . Pt on 3 L o2 at all times

## 2023-01-18 NOTE — Telephone Encounter (Signed)
Home Health Verbal Orders - Caller/Agency: Phu  with [facility] Number: [phone] Requesting : PT Frequency: 2X4 1X4

## 2023-01-18 NOTE — Transitions of Care (Post Inpatient/ED Visit) (Signed)
   01/18/2023  Name: Lawrence [name] MRN: [id number] DOB: 04/03/1956  Today's TOC FU Call Status: Today's TOC FU Call Status:: Unsuccessul Call (1st Attempt) Unsuccessful Call (1st Attempt) Date: 01/18/23  Attempted to reach the patient regarding the most recent Inpatient/ED visit.  Follow Up Plan: Additional outreach attempts will be made to reach the patient to complete the Transitions of Care (Post Inpatient/ED visit) call.   Signature Damarys Speir, [place]

## 2023-01-18 NOTE — Discharge Instructions (Signed)
As we discussed your workup in the emergency department shows overall reassuring results.  It does show a possible sacral/coccyx fracture.  Please follow-up with your doctor in 1 week for recheck/reevaluation.  Return to the emergency department for any symptom personally concerning to yourself.

## 2023-01-19 ENCOUNTER — Ambulatory Visit: Payer: Self-pay

## 2023-01-19 DIAGNOSIS — I130 Hypertensive heart and chronic kidney disease with heart failure and stage 1 through stage 4 chronic kidney disease, or unspecified chronic kidney disease: Secondary | ICD-10-CM | POA: Diagnosis not present

## 2023-01-19 DIAGNOSIS — E1122 Type 2 diabetes mellitus with diabetic chronic kidney disease: Secondary | ICD-10-CM | POA: Diagnosis not present

## 2023-01-19 DIAGNOSIS — I700 Atherosclerosis of aorta: Secondary | ICD-10-CM | POA: Diagnosis not present

## 2023-01-19 DIAGNOSIS — N182 Chronic kidney disease, stage 2 (mild): Secondary | ICD-10-CM | POA: Diagnosis not present

## 2023-01-19 DIAGNOSIS — D631 Anemia in chronic kidney disease: Secondary | ICD-10-CM | POA: Diagnosis not present

## 2023-01-19 DIAGNOSIS — J4489 Other specified chronic obstructive pulmonary disease: Secondary | ICD-10-CM | POA: Diagnosis not present

## 2023-01-19 DIAGNOSIS — I5032 Chronic diastolic (congestive) heart failure: Secondary | ICD-10-CM | POA: Diagnosis not present

## 2023-01-19 DIAGNOSIS — I5023 Acute on chronic systolic (congestive) heart failure: Secondary | ICD-10-CM | POA: Diagnosis not present

## 2023-01-19 DIAGNOSIS — I482 Chronic atrial fibrillation, unspecified: Secondary | ICD-10-CM | POA: Diagnosis not present

## 2023-01-19 NOTE — Telephone Encounter (Signed)
Advised 

## 2023-01-19 NOTE — Telephone Encounter (Signed)
  Chief Complaint: fall Symptoms: fall last night and tailbone injury Frequency: last night  Pertinent Negatives: NA Disposition: '[]'$ ED /'[]'$ Urgent Care (no appt availability in office) / '[x]'$ Appointment(In office/virtual)/ '[]'$  [facility]/ '[]'$ Home Care/ '[]'$ Refused Recommended Disposition /'[]'$ [place] [facility]/ '[]'$  Follow-up with PCP Additional Notes: Randy, LPN with [facility] calling to report pt fall last night, pt called EMS and they transported to [facility]. Pt does have possible sacral coccyx fx. Already has [facility] appt scheduled for 02/15/23. VSS and pt no complaints today besides just sore. Advised pt to call back sooner if sx get worse or needs to be seen prior to appt. No further assistance noted.   Reason for Disposition  [1] Fall AND [2] went to emergency department for evaluation or treatment  Answer Assessment - Initial Assessment Questions 1. MECHANISM: "How did the fall happen?"     Pt had fall getting out of shower last night  3. ONSET: "When did the fall happen?" (e.g., minutes, hours, or days ago)     Last night  4. LOCATION: "What part of the body hit the ground?" (e.g., back, buttocks, head, hips, knees, hands, head, stomach)     tailbone 5. INJURY: "Did you hurt (injure) yourself when you fell?" If Yes, ask: "What did you injure? Tell me more about this?" (e.g., body area; type of injury; pain severity)"     Tailbone injury  Protocols used: Falls and [facility]

## 2023-01-20 DIAGNOSIS — R4789 Other speech disturbances: Secondary | ICD-10-CM | POA: Diagnosis not present

## 2023-01-20 DIAGNOSIS — M47812 Spondylosis without myelopathy or radiculopathy, cervical region: Secondary | ICD-10-CM | POA: Diagnosis not present

## 2023-01-20 DIAGNOSIS — R4781 Slurred speech: Secondary | ICD-10-CM | POA: Diagnosis not present

## 2023-01-20 DIAGNOSIS — M545 Low back pain, unspecified: Secondary | ICD-10-CM | POA: Diagnosis not present

## 2023-01-20 DIAGNOSIS — R531 Weakness: Secondary | ICD-10-CM | POA: Diagnosis not present

## 2023-01-20 DIAGNOSIS — R9089 Other abnormal findings on diagnostic imaging of central nervous system: Secondary | ICD-10-CM | POA: Diagnosis not present

## 2023-01-20 DIAGNOSIS — S199XXA Unspecified injury of neck, initial encounter: Secondary | ICD-10-CM | POA: Diagnosis not present

## 2023-01-20 DIAGNOSIS — Z7901 Long term (current) use of anticoagulants: Secondary | ICD-10-CM | POA: Diagnosis not present

## 2023-01-20 DIAGNOSIS — M8580 Other specified disorders of bone density and structure, unspecified site: Secondary | ICD-10-CM | POA: Diagnosis not present

## 2023-01-20 DIAGNOSIS — J449 Chronic obstructive pulmonary disease, unspecified: Secondary | ICD-10-CM | POA: Diagnosis not present

## 2023-01-20 DIAGNOSIS — Z7984 Long term (current) use of oral hypoglycemic drugs: Secondary | ICD-10-CM | POA: Diagnosis not present

## 2023-01-20 DIAGNOSIS — R479 Unspecified speech disturbances: Secondary | ICD-10-CM | POA: Diagnosis not present

## 2023-01-20 DIAGNOSIS — R4701 Aphasia: Secondary | ICD-10-CM | POA: Diagnosis not present

## 2023-01-20 DIAGNOSIS — I771 Stricture of artery: Secondary | ICD-10-CM | POA: Diagnosis not present

## 2023-01-20 DIAGNOSIS — I480 Paroxysmal atrial fibrillation: Secondary | ICD-10-CM | POA: Diagnosis not present

## 2023-01-20 DIAGNOSIS — S299XXA Unspecified injury of thorax, initial encounter: Secondary | ICD-10-CM | POA: Diagnosis not present

## 2023-01-20 DIAGNOSIS — I10 Essential (primary) hypertension: Secondary | ICD-10-CM | POA: Diagnosis not present

## 2023-01-20 DIAGNOSIS — E785 Hyperlipidemia, unspecified: Secondary | ICD-10-CM | POA: Diagnosis not present

## 2023-01-20 DIAGNOSIS — G319 Degenerative disease of nervous system, unspecified: Secondary | ICD-10-CM | POA: Diagnosis not present

## 2023-01-20 DIAGNOSIS — S12200A Unspecified displaced fracture of third cervical vertebra, initial encounter for closed fracture: Secondary | ICD-10-CM | POA: Diagnosis not present

## 2023-01-20 DIAGNOSIS — M47816 Spondylosis without myelopathy or radiculopathy, lumbar region: Secondary | ICD-10-CM | POA: Diagnosis not present

## 2023-01-20 DIAGNOSIS — G8929 Other chronic pain: Secondary | ICD-10-CM | POA: Diagnosis not present

## 2023-01-21 ENCOUNTER — Telehealth: Payer: Self-pay

## 2023-01-21 DIAGNOSIS — E1122 Type 2 diabetes mellitus with diabetic chronic kidney disease: Secondary | ICD-10-CM | POA: Diagnosis not present

## 2023-01-21 DIAGNOSIS — I5032 Chronic diastolic (congestive) heart failure: Secondary | ICD-10-CM | POA: Diagnosis not present

## 2023-01-21 DIAGNOSIS — J4489 Other specified chronic obstructive pulmonary disease: Secondary | ICD-10-CM | POA: Diagnosis not present

## 2023-01-21 DIAGNOSIS — M545 Low back pain, unspecified: Secondary | ICD-10-CM | POA: Diagnosis not present

## 2023-01-21 DIAGNOSIS — D631 Anemia in chronic kidney disease: Secondary | ICD-10-CM | POA: Diagnosis not present

## 2023-01-21 DIAGNOSIS — I482 Chronic atrial fibrillation, unspecified: Secondary | ICD-10-CM | POA: Diagnosis not present

## 2023-01-21 DIAGNOSIS — I5023 Acute on chronic systolic (congestive) heart failure: Secondary | ICD-10-CM | POA: Diagnosis not present

## 2023-01-21 DIAGNOSIS — I700 Atherosclerosis of aorta: Secondary | ICD-10-CM | POA: Diagnosis not present

## 2023-01-21 DIAGNOSIS — I130 Hypertensive heart and chronic kidney disease with heart failure and stage 1 through stage 4 chronic kidney disease, or unspecified chronic kidney disease: Secondary | ICD-10-CM | POA: Diagnosis not present

## 2023-01-21 DIAGNOSIS — N182 Chronic kidney disease, stage 2 (mild): Secondary | ICD-10-CM | POA: Diagnosis not present

## 2023-01-21 NOTE — Telephone Encounter (Signed)
Copied from [place] [phone]. Topic: General - Other >> Jan 21, 2023  4:07 PM Ludger Nutting wrote: Home Health Verbal Orders - Caller/Agency: [facility] Number: [phone] Requesting OT/PT/Skilled Nursing/Social Work/Speech Therapy: OT Frequency: 1w7

## 2023-01-21 NOTE — Telephone Encounter (Signed)
[place] for verbal orders.    Eulis Foster, MD  [facility]

## 2023-01-22 NOTE — Telephone Encounter (Signed)
Verbal orders given to Shireen 

## 2023-01-26 DIAGNOSIS — I5032 Chronic diastolic (congestive) heart failure: Secondary | ICD-10-CM | POA: Diagnosis not present

## 2023-01-26 DIAGNOSIS — I482 Chronic atrial fibrillation, unspecified: Secondary | ICD-10-CM | POA: Diagnosis not present

## 2023-01-26 DIAGNOSIS — I10 Essential (primary) hypertension: Secondary | ICD-10-CM | POA: Diagnosis not present

## 2023-01-26 DIAGNOSIS — E1122 Type 2 diabetes mellitus with diabetic chronic kidney disease: Secondary | ICD-10-CM | POA: Diagnosis not present

## 2023-01-26 DIAGNOSIS — N182 Chronic kidney disease, stage 2 (mild): Secondary | ICD-10-CM | POA: Diagnosis not present

## 2023-01-26 DIAGNOSIS — I130 Hypertensive heart and chronic kidney disease with heart failure and stage 1 through stage 4 chronic kidney disease, or unspecified chronic kidney disease: Secondary | ICD-10-CM | POA: Diagnosis not present

## 2023-01-26 DIAGNOSIS — J4489 Other specified chronic obstructive pulmonary disease: Secondary | ICD-10-CM | POA: Diagnosis not present

## 2023-01-26 DIAGNOSIS — I5023 Acute on chronic systolic (congestive) heart failure: Secondary | ICD-10-CM | POA: Diagnosis not present

## 2023-01-26 DIAGNOSIS — I700 Atherosclerosis of aorta: Secondary | ICD-10-CM | POA: Diagnosis not present

## 2023-01-26 DIAGNOSIS — R42 Dizziness and giddiness: Secondary | ICD-10-CM | POA: Diagnosis not present

## 2023-01-26 DIAGNOSIS — G4489 Other headache syndrome: Secondary | ICD-10-CM | POA: Diagnosis not present

## 2023-01-26 DIAGNOSIS — D631 Anemia in chronic kidney disease: Secondary | ICD-10-CM | POA: Diagnosis not present

## 2023-01-27 ENCOUNTER — Emergency Department: Payer: Medicare HMO

## 2023-01-27 ENCOUNTER — Emergency Department
Admission: EM | Admit: 2023-01-27 | Discharge: 2023-01-27 | Disposition: A | Discharge status: Home / Self Care | Payer: Medicare HMO | Attending: Emergency Medicine | Admitting: Emergency Medicine

## 2023-01-27 ENCOUNTER — Other Ambulatory Visit: Payer: Self-pay

## 2023-01-27 DIAGNOSIS — J449 Chronic obstructive pulmonary disease, unspecified: Secondary | ICD-10-CM | POA: Diagnosis not present

## 2023-01-27 DIAGNOSIS — I509 Heart failure, unspecified: Secondary | ICD-10-CM | POA: Diagnosis not present

## 2023-01-27 DIAGNOSIS — I110 Hypertensive heart disease with heart failure: Secondary | ICD-10-CM | POA: Insufficient documentation

## 2023-01-27 DIAGNOSIS — I482 Chronic atrial fibrillation, unspecified: Secondary | ICD-10-CM | POA: Diagnosis not present

## 2023-01-27 DIAGNOSIS — J4489 Other specified chronic obstructive pulmonary disease: Secondary | ICD-10-CM | POA: Diagnosis not present

## 2023-01-27 DIAGNOSIS — I2510 Atherosclerotic heart disease of native coronary artery without angina pectoris: Secondary | ICD-10-CM | POA: Insufficient documentation

## 2023-01-27 DIAGNOSIS — R911 Solitary pulmonary nodule: Secondary | ICD-10-CM | POA: Diagnosis not present

## 2023-01-27 DIAGNOSIS — I130 Hypertensive heart and chronic kidney disease with heart failure and stage 1 through stage 4 chronic kidney disease, or unspecified chronic kidney disease: Secondary | ICD-10-CM | POA: Diagnosis not present

## 2023-01-27 DIAGNOSIS — R42 Dizziness and giddiness: Secondary | ICD-10-CM | POA: Insufficient documentation

## 2023-01-27 DIAGNOSIS — I700 Atherosclerosis of aorta: Secondary | ICD-10-CM | POA: Diagnosis not present

## 2023-01-27 DIAGNOSIS — R079 Chest pain, unspecified: Secondary | ICD-10-CM | POA: Insufficient documentation

## 2023-01-27 DIAGNOSIS — I5032 Chronic diastolic (congestive) heart failure: Secondary | ICD-10-CM | POA: Diagnosis not present

## 2023-01-27 DIAGNOSIS — R0789 Other chest pain: Secondary | ICD-10-CM | POA: Diagnosis not present

## 2023-01-27 DIAGNOSIS — I4891 Unspecified atrial fibrillation: Secondary | ICD-10-CM | POA: Diagnosis not present

## 2023-01-27 DIAGNOSIS — Z7901 Long term (current) use of anticoagulants: Secondary | ICD-10-CM | POA: Insufficient documentation

## 2023-01-27 DIAGNOSIS — E1122 Type 2 diabetes mellitus with diabetic chronic kidney disease: Secondary | ICD-10-CM | POA: Diagnosis not present

## 2023-01-27 DIAGNOSIS — D631 Anemia in chronic kidney disease: Secondary | ICD-10-CM | POA: Diagnosis not present

## 2023-01-27 DIAGNOSIS — I5023 Acute on chronic systolic (congestive) heart failure: Secondary | ICD-10-CM | POA: Diagnosis not present

## 2023-01-27 DIAGNOSIS — R0602 Shortness of breath: Secondary | ICD-10-CM | POA: Diagnosis not present

## 2023-01-27 DIAGNOSIS — N182 Chronic kidney disease, stage 2 (mild): Secondary | ICD-10-CM | POA: Diagnosis not present

## 2023-01-27 DIAGNOSIS — S0990XA Unspecified injury of head, initial encounter: Secondary | ICD-10-CM | POA: Diagnosis not present

## 2023-01-27 LAB — BASIC METABOLIC PANEL
Anion gap: 8 (ref 5–15)
BUN: 8 mg/dL (ref 8–23)
CO2: 28 mmol/L (ref 22–32)
Calcium: 9.3 mg/dL (ref 8.9–10.3)
Chloride: 102 mmol/L (ref 98–111)
Creatinine, Ser: 0.88 mg/dL (ref 0.61–1.24)
GFR, Estimated: >60 mL/min (ref >60)
Glucose, Bld: 130 mg/dL — ABNORMAL HIGH (ref 70–99)
Potassium: 3.5 mmol/L (ref 3.5–5.1)
Sodium: 138 mmol/L (ref 135–145)

## 2023-01-27 LAB — CBC
HCT: 50.4 % (ref 39.0–52.0)
Hemoglobin: 15.9 g/dL (ref 13.0–17.0)
MCH: 28.5 pg (ref 26.0–34.0)
MCHC: 31.5 g/dL (ref 30.0–36.0)
MCV: 90.3 fL (ref 80.0–100.0)
Platelets: 177 10*3/uL (ref 150–400)
RBC: 5.58 MIL/uL (ref 4.22–5.81)
RDW: 15.5 % (ref 11.5–15.5)
WBC: 7.8 10*3/uL (ref 4.0–10.5)
nRBC: 0 % (ref 0.0–0.2)

## 2023-01-27 LAB — HEPATIC FUNCTION PANEL
ALT: 14 U/L (ref 0–44)
AST: 21 U/L (ref 15–41)
Albumin: 3.7 g/dL (ref 3.5–5.0)
Alkaline Phosphatase: 73 U/L (ref 38–126)
Bilirubin, Direct: <0.1 mg/dL (ref 0.0–0.2)
Total Bilirubin: 0.7 mg/dL (ref 0.3–1.2)
Total Protein: 7 g/dL (ref 6.5–8.1)

## 2023-01-27 LAB — LIPASE, BLOOD: Lipase: 42 U/L (ref 11–51)

## 2023-01-27 LAB — TROPONIN I (HIGH SENSITIVITY)
Troponin I (High Sensitivity): 40 ng/L — ABNORMAL HIGH (ref <18)
Troponin I (High Sensitivity): 44 ng/L — ABNORMAL HIGH (ref <18)

## 2023-01-27 MED ORDER — FAMOTIDINE 20 MG PO TABS
20.0000 mg | ORAL_TABLET | Freq: Once | ORAL | Status: AC
  Administered 2023-01-27: 20 mg via ORAL
  Filled 2023-01-27: qty 1

## 2023-01-27 MED ORDER — OXYCODONE HCL 5 MG PO TABS
5.0000 mg | ORAL_TABLET | Freq: Once | ORAL | Status: AC
  Administered 2023-01-27: 5 mg via ORAL
  Filled 2023-01-27: qty 1

## 2023-01-27 MED ORDER — ACETAMINOPHEN 500 MG PO TABS
1000.0000 mg | ORAL_TABLET | Freq: Once | ORAL | Status: AC
  Administered 2023-01-27: 1000 mg via ORAL
  Filled 2023-01-27: qty 2

## 2023-01-27 MED ORDER — OXYCODONE HCL 5 MG PO TABS
10.0000 mg | ORAL_TABLET | Freq: Once | ORAL | Status: AC
  Administered 2023-01-27: 10 mg via ORAL
  Filled 2023-01-27: qty 2

## 2023-01-27 MED ORDER — IOHEXOL 350 MG/ML SOLN
100.0000 mL | Freq: Once | INTRAVENOUS | Status: AC | PRN
  Administered 2023-01-27: 100 mL via INTRAVENOUS

## 2023-01-27 MED ORDER — LABETALOL HCL 5 MG/ML IV SOLN
10.0000 mg | Freq: Once | INTRAVENOUS | Status: AC
  Administered 2023-01-27: 10 mg via INTRAVENOUS
  Filled 2023-01-27: qty 4

## 2023-01-27 MED ORDER — DIAZEPAM 5 MG/ML IJ SOLN
5.0000 mg | Freq: Once | INTRAMUSCULAR | Status: AC
  Administered 2023-01-27: 5 mg via INTRAVENOUS
  Filled 2023-01-27: qty 2

## 2023-01-27 MED ORDER — LABETALOL HCL 100 MG PO TABS
100.0000 mg | ORAL_TABLET | Freq: Once | ORAL | Status: AC
  Administered 2023-01-27: 100 mg via ORAL
  Filled 2023-01-27: qty 1

## 2023-01-27 MED ORDER — NITROGLYCERIN 0.4 MG SL SUBL
SUBLINGUAL_TABLET | SUBLINGUAL | Status: AC
  Administered 2023-01-27: 0.4 mg
  Filled 2023-01-27: qty 1

## 2023-01-27 NOTE — Discharge Instructions (Addendum)
Take all your home medications as prescribed.  Follow-up with your heart doctor and primary doctor as scheduled.  There was a lung nodule found in your pictures today that you will need to follow-up with your primary doctor to recheck.  I also made a referral to pulmonary nodule clinic who should call you to discuss further testing.  Thank you for choosing [place] for your health care today!  Please see your primary doctor this week for a follow up appointment.   Sometimes, in the early stages of certain disease courses it is difficult to detect in the emergency department evaluation -- so, it is important that you continue to monitor your symptoms and call your doctor right away or return to the emergency department if you develop any new or worsening symptoms.  Please go to the following website to schedule new (and existing) patient appointments:   [email or web address]  If you do not have a primary doctor try calling the following clinics to establish care:  If you have insurance:  [facility] [phone] [place] [address]   [facility]  [phone] [place] [place] [address]   If you do not have insurance:  [facility]  [phone] [address], [place] [address]   The following is another list of primary care offices in the area who are accepting new patients at this time.  Please reach out to one of them directly and let them know you would like to schedule an appointment to follow up on an Emergency Department visit, and/or to establish a new primary care provider (PCP).  There are likely other primary care clinics in the are who are accepting new patients, but this is an excellent place to start:  [place] physician: Dr Lavon Paganini [address] [place], [place] [address] [phone]  [facility] Lead Physician: Dr  Steele Sizer [address], [place], [place] [address] [phone]  [place] Physician: Dr Park Liter [address] [place], [place] [address] [phone]  [facility] Lead Physician: Dr Dewaine Oats [place], [place], [place] [address] [phone]  [facility] Physician: Dr Halina Maidens [address] [place], [place] [address] [phone]   It was my pleasure to care for you today.   [place] Jacelyn Grip, MD

## 2023-01-27 NOTE — ED Provider Notes (Signed)
[facility] Provider Note    Event Date/Time   First MD Initiated Contact with Patient 01/27/23 0025     (approximate)   History   Chest Pain   HPI  Lawrence [name] is a 67 y.o. male   Past medical history of atrial fibrillation on Eliquis, CAD, CHF, COPD on chronic O2, GERD, pancreatitis, chronic pain here with chest pain and orthostatic lightheadedness.  Onset today this morning, reports being at a friend's house and standing up upon standing got lightheaded and fell to his knees.  No loss of consciousness or head strike.  He developed substernal chest pain at rest later tonight, nonexertional and nonradiating.  Denies respiratory infectious symptoms, denies shortness of breath, denies nausea or vomiting or any other GI or GU complaints.  He takes a number of antihypertensives and did not take his night dose, though he does not know the names of his medications.  He takes Xanax chronically as well multiple times a day and has not had his night dose as well.  External Medical Documents Reviewed: Emergency department visit dated 01/18/2023 for a fall and back pain      Physical Exam   Triage Vital Signs: ED Triage Vitals [01/27/23 0026]  Enc Vitals Group     BP (!) 183/131     Pulse Rate 95     Resp 16     Temp      Temp src      SpO2 98 %     Weight 182 lb 15.7 oz (83 kg)     Height      Head Circumference      Peak Flow      Pain Score 9     Pain Loc      Pain Edu?      Excl. in [place]?     Most recent vital signs: Vitals:   01/27/23 0330 01/27/23 0345  BP: (!) 163/101 (!) 157/97  Pulse: 80 75  Resp: 19 15  Temp:  (!) 97.5 F (36.4 C)  SpO2: 100% 100%    General: Awake, no distress.  CV:  Good peripheral perfusion.  Resp:  Normal effort.  Abd:  No distention.  Other:  Awake alert oriented.  Hypertensive 170s over 100.  No hypoxemia on his home O2 nasal cannula lungs clear abdomen is soft and nontender deep palpation all quadrants.   Skin appears warm well-perfused.  Blood pressure is the same in both arms.  He has no focal neurologic deficits including dysarthria facial asymmetry motor or sensory deficits and has no discoordination on testing finger-to-nose or heel/shin, no visual cuts.   ED Results / Procedures / Treatments   Labs (all labs ordered are listed, but only abnormal results are displayed) Labs Reviewed  BASIC METABOLIC PANEL - Abnormal; Notable for the following components:      Result Value   Glucose, Bld 130 (*)    All other components within normal limits  TROPONIN I (HIGH SENSITIVITY) - Abnormal; Notable for the following components:   Troponin I (High Sensitivity) 40 (*)    All other components within normal limits  TROPONIN I (HIGH SENSITIVITY) - Abnormal; Notable for the following components:   Troponin I (High Sensitivity) 44 (*)    All other components within normal limits  CBC  LIPASE, BLOOD  HEPATIC FUNCTION PANEL     I ordered and reviewed the above labs they are notable for troponin is 40 and 44  EKG  ED ECG REPORT I, Lucillie Garfinkel, the attending physician, personally viewed and interpreted this ECG.   Date: 01/27/2023  EKG Time: 0022  Rate: 94  Rhythm: AF  Axis: nl  Intervals:none  ST&T Change: No acute ischemic changes    RADIOLOGY I independently reviewed and interpreted CT of the head and see no obvious bleeding or midline shift   PROCEDURES:  Critical Care performed: No  Procedures   MEDICATIONS ORDERED IN ED: Medications  labetalol (NORMODYNE) tablet 100 mg (has no administration in time range)  oxyCODONE (Oxy IR/ROXICODONE) immediate release tablet 10 mg (10 mg Oral Given 01/27/23 0135)  acetaminophen (TYLENOL) tablet 1,000 mg (1,000 mg Oral Given 01/27/23 0135)  nitroGLYCERIN (NITROSTAT) 0.4 MG SL tablet (0.4 mg  Given 01/27/23 0227)  labetalol (NORMODYNE) injection 10 mg (10 mg Intravenous Given 01/27/23 0353)  diazepam (VALIUM) injection 5 mg (5 mg  Intravenous Given 01/27/23 0353)  oxyCODONE (Oxy IR/ROXICODONE) immediate release tablet 5 mg (5 mg Oral Given 01/27/23 0354)  famotidine (PEPCID) tablet 20 mg (20 mg Oral Given 01/27/23 0354)  iohexol (OMNIPAQUE) 350 MG/ML injection 100 mL (100 mLs Intravenous Contrast Given 01/27/23 0405)     IMPRESSION / MDM / ASSESSMENT AND PLAN / ED COURSE  I reviewed the triage vital signs and the nursing notes.                                Patient's presentation is most consistent with acute presentation with potential threat to life or bodily function.  Differential diagnosis includes, but is not limited to, ACS, PE, dissection, COPD exacerbation, CHF exacerbation, pneumonia, pneumothorax, GERD or gastritis, pancreatitis, biliary pathologies, chronic pain   The patient is on the cardiac monitor to evaluate for evidence of arrhythmia and/or significant heart rate changes.  MDM: This is a patient with hypertension CAD history with chest pain and lightheadedness.  Lightheadedness upon standing only with no focal neurological deficits I think very unlikely CVA.  EKG is nonischemic and serial troponins have been flat.  He remains hypertensive, has not taken his blood pressure medicines  Will trial antacid treat GERD. Give oxycodone as this is a home medication for his chronic pain. Give benzodiazepine given his chronic Xanax use and he is due for dose, prevent withdrawal. Still pending lipase to check for pancreatitis as well as LFTs for biliary pathologies/liver pathology.  Given his ongoing chest pain now worsening and hypertension I will check a CT angiogram to rule out dissection.  His worsening symptoms may also be due to his need for benzodiazepine given chronic use and has been in the emergency department without his Xanax will dose as above while awaiting scan.  Blood pressures got better with labetalol, CT angiogram is negative for dissection, incidental pulmonary nodule will follow-up in  pulmonary nodule clinic.  I stood the patient up at bedside and had him walk and he is no longer lightheaded.  I considered hospitalization for admission or observation given stability in the emergency department improved blood pressure and unremarkable workup for emergent cardiopulmonary pathologies as above, I think outpatient follow-up appropriate at this time.  Patient in agreement.        FINAL CLINICAL IMPRESSION(S) / ED DIAGNOSES   Final diagnoses:  Nonspecific chest pain  Episodic lightheadedness     Rx / DC Orders   ED Discharge Orders     None        Note:  This document  was prepared using Systems analyst and may include unintentional dictation errors.    Lucillie Garfinkel, MD 01/27/23 [phone]

## 2023-01-27 NOTE — ED Triage Notes (Signed)
Pt states chest pain that began an hour pta. Pt with headache reported as well as dizziness. Ems gave 2 sprays of ntg that did not help chest pain and '324mg'$  asa. Pt is on 3lpm oxygen via [place] chronically.

## 2023-01-27 NOTE — ED Notes (Signed)
EKG given to dr. Jacelyn Grip. Primary rn josh at bedside.

## 2023-01-28 DIAGNOSIS — M79662 Pain in left lower leg: Secondary | ICD-10-CM | POA: Diagnosis not present

## 2023-01-28 DIAGNOSIS — R609 Edema, unspecified: Secondary | ICD-10-CM | POA: Diagnosis not present

## 2023-01-28 DIAGNOSIS — I10 Essential (primary) hypertension: Secondary | ICD-10-CM | POA: Diagnosis not present

## 2023-01-29 ENCOUNTER — Other Ambulatory Visit: Payer: Self-pay

## 2023-01-29 ENCOUNTER — Emergency Department
Admission: EM | Admit: 2023-01-29 | Discharge: 2023-01-29 | Disposition: A | Discharge status: Home / Self Care | Payer: Medicare HMO | Source: Home / Self Care | Attending: Emergency Medicine | Admitting: Emergency Medicine

## 2023-01-29 ENCOUNTER — Emergency Department
Admission: EM | Admit: 2023-01-29 | Discharge: 2023-01-29 | Disposition: A | Discharge status: Home / Self Care | Payer: Medicare HMO | Attending: Emergency Medicine | Admitting: Emergency Medicine

## 2023-01-29 ENCOUNTER — Emergency Department: Payer: Medicare HMO

## 2023-01-29 ENCOUNTER — Ambulatory Visit: Payer: Medicare HMO | Admitting: Physician Assistant

## 2023-01-29 ENCOUNTER — Ambulatory Visit: Payer: Self-pay | Admitting: *Deleted

## 2023-01-29 DIAGNOSIS — I130 Hypertensive heart and chronic kidney disease with heart failure and stage 1 through stage 4 chronic kidney disease, or unspecified chronic kidney disease: Secondary | ICD-10-CM | POA: Insufficient documentation

## 2023-01-29 DIAGNOSIS — M79605 Pain in left leg: Secondary | ICD-10-CM | POA: Insufficient documentation

## 2023-01-29 DIAGNOSIS — I5043 Acute on chronic combined systolic (congestive) and diastolic (congestive) heart failure: Secondary | ICD-10-CM | POA: Insufficient documentation

## 2023-01-29 DIAGNOSIS — I110 Hypertensive heart disease with heart failure: Secondary | ICD-10-CM | POA: Insufficient documentation

## 2023-01-29 DIAGNOSIS — Z951 Presence of aortocoronary bypass graft: Secondary | ICD-10-CM | POA: Insufficient documentation

## 2023-01-29 DIAGNOSIS — D631 Anemia in chronic kidney disease: Secondary | ICD-10-CM | POA: Diagnosis not present

## 2023-01-29 DIAGNOSIS — I5023 Acute on chronic systolic (congestive) heart failure: Secondary | ICD-10-CM | POA: Diagnosis not present

## 2023-01-29 DIAGNOSIS — I2510 Atherosclerotic heart disease of native coronary artery without angina pectoris: Secondary | ICD-10-CM | POA: Insufficient documentation

## 2023-01-29 DIAGNOSIS — J449 Chronic obstructive pulmonary disease, unspecified: Secondary | ICD-10-CM | POA: Insufficient documentation

## 2023-01-29 DIAGNOSIS — J4489 Other specified chronic obstructive pulmonary disease: Secondary | ICD-10-CM | POA: Diagnosis not present

## 2023-01-29 DIAGNOSIS — Z85828 Personal history of other malignant neoplasm of skin: Secondary | ICD-10-CM | POA: Diagnosis not present

## 2023-01-29 DIAGNOSIS — Z87891 Personal history of nicotine dependence: Secondary | ICD-10-CM | POA: Insufficient documentation

## 2023-01-29 DIAGNOSIS — I10 Essential (primary) hypertension: Secondary | ICD-10-CM | POA: Diagnosis not present

## 2023-01-29 DIAGNOSIS — N182 Chronic kidney disease, stage 2 (mild): Secondary | ICD-10-CM | POA: Insufficient documentation

## 2023-01-29 DIAGNOSIS — I5032 Chronic diastolic (congestive) heart failure: Secondary | ICD-10-CM | POA: Insufficient documentation

## 2023-01-29 DIAGNOSIS — E1122 Type 2 diabetes mellitus with diabetic chronic kidney disease: Secondary | ICD-10-CM | POA: Insufficient documentation

## 2023-01-29 DIAGNOSIS — E119 Type 2 diabetes mellitus without complications: Secondary | ICD-10-CM | POA: Insufficient documentation

## 2023-01-29 DIAGNOSIS — M5410 Radiculopathy, site unspecified: Secondary | ICD-10-CM

## 2023-01-29 DIAGNOSIS — I700 Atherosclerosis of aorta: Secondary | ICD-10-CM | POA: Diagnosis not present

## 2023-01-29 DIAGNOSIS — F419 Anxiety disorder, unspecified: Secondary | ICD-10-CM

## 2023-01-29 DIAGNOSIS — I482 Chronic atrial fibrillation, unspecified: Secondary | ICD-10-CM | POA: Diagnosis not present

## 2023-01-29 DIAGNOSIS — Z7901 Long term (current) use of anticoagulants: Secondary | ICD-10-CM | POA: Insufficient documentation

## 2023-01-29 MED ORDER — OXYCODONE HCL 5 MG PO TABS
10.0000 mg | ORAL_TABLET | Freq: Once | ORAL | Status: DC
  Filled 2023-01-29: qty 2

## 2023-01-29 MED ORDER — MORPHINE SULFATE (PF) 4 MG/ML IV SOLN
4.0000 mg | Freq: Once | INTRAVENOUS | Status: AC
  Administered 2023-01-29: 4 mg via INTRAMUSCULAR
  Filled 2023-01-29: qty 1

## 2023-01-29 MED ORDER — HYDROMORPHONE HCL 1 MG/ML IJ SOLN
1.0000 mg | Freq: Once | INTRAMUSCULAR | Status: AC
  Administered 2023-01-29: 1 mg via INTRAVENOUS
  Filled 2023-01-29: qty 1

## 2023-01-29 NOTE — ED Triage Notes (Signed)
Pt comes with c/o left leg pain via EMS. Pt was just seen here for same and dc home this morning. Pt states 8/10 pain.

## 2023-01-29 NOTE — Progress Notes (Deleted)
Established patient visit   Patient: Lawrence [name]   DOB: 08-15-1956   67 y.o. Male  MRN: [id number] Visit Date: 01/29/2023  Today's healthcare provider: Mikey Kirschner, PA-C   No chief complaint on file.  Subjective    HPI  Follow up ER visit  Patient was seen in ER for Left Leg Pain on 03/14 entering to today. Treatment for this included DVT study is negative,IV Dilaudid. Symptoms: This morning (Fri) his foot is still cold, pale but also red and dotted looking and numb.    It's very sensitive and he can feel but it's also numb.   Denies pins and needles feelings.   Bottom of foot is also swollen. Frequency: He noticed the coldness in left foot at 11:00 PM last night and called EMS which took him to [facility]. He is not happy with the care he received. Pertinent Negatives: Patient denies it being any better today.  He is concerned because of having diabetes and his foot being cold, pale and red that something is going on. No history of blood clots. -----------------------------------------------------------------------------------------   Medications: Outpatient Medications Prior to Visit  Medication Sig   Accu-Chek Softclix Lancets lancets Use as instructed to check sugar daily for type 2 diabetes.   acetaminophen (TYLENOL) 325 MG tablet Take 2 tablets (650 mg total) by mouth every 4 (four) hours as needed for headache or mild pain.   albuterol (VENTOLIN HFA) 108 (90 Base) MCG/ACT inhaler INHALE 2 PUFFS BY MOUTH EVERY 6 HOURS AS NEEDED FOR SHORTNESS OF BREATH   allopurinol (ZYLOPRIM) 300 MG tablet TAKE 1 TABLET BY MOUTH TWICE A DAY (Patient taking differently: Take 300 mg by mouth 2 (two) times daily.)   ALPRAZolam (XANAX) 1 MG tablet Take 1 tablet (1 mg total) by mouth 3 (three) times daily.   aspirin 81 MG chewable tablet Chew 81 mg by mouth daily. Swallows whole   atorvastatin (LIPITOR) 80 MG tablet Take 1 tablet (80 mg total) by mouth daily. Please  schedule office visit before any future refill.   BELSOMRA 5 MG TABS TAKE 1 TABLET (5 MG TOTAL) BY MOUTH AT BEDTIME AS NEEDED (Patient taking differently: Take 5 mg by mouth at bedtime as needed.)   Blood Glucose Calibration (ACCU-CHEK GUIDE CONTROL) LIQD Use with blood glucose monitor as directed   Blood Glucose Monitoring Suppl (ACCU-CHEK GUIDE) w/Device KIT Use to check blood sugars as directed   BREO ELLIPTA 100-25 MCG/ACT AEPB INHALE 1 PUFF BY MOUTH INTO THE LUNGS DAILY.   celecoxib (CELEBREX) 200 MG capsule Take 1 capsule (200 mg total) by mouth 2 (two) times daily as needed. Home med.   cetirizine (ZYRTEC) 10 MG tablet TAKE 1 TABLET BY MOUTH AT BEDTIME   ELIQUIS 5 MG TABS tablet TAKE 1 TABLET BY MOUTH TWICE A DAY (Patient taking differently: Take 5 mg by mouth 2 (two) times daily.)   FARXIGA 10 MG TABS tablet TAKE 1 TABLET BY MOUTH DAILY (Patient taking differently: Take 10 mg by mouth daily. TAKE 1 TABLET BY MOUTH DAILY)   glucose blood (ACCU-CHEK GUIDE) test strip Use as instructed to check sugar daily for type 2 diabetes.   isosorbide mononitrate (IMDUR) 60 MG 24 hr tablet Take 1-2 tablets (60-120 mg total) by mouth daily. Home med.   linaclotide (LINZESS) 72 MCG capsule Take 72 mcg by mouth daily before breakfast.   lisinopril (ZESTRIL) 10 MG tablet TAKE 1 TABLET BY MOUTH DAILY (Patient taking differently: Take  10 mg by mouth daily.)   meclizine (ANTIVERT) 25 MG tablet TAKE ONE TABLET BY THREE TIMES A DAY AS NEEDED FOR DIZZINESS (Patient taking differently: Take 25 mg by mouth 3 (three) times daily.)   metFORMIN (GLUCOPHAGE-XR) 500 MG 24 hr tablet TAKE 1 TABLET BY MOUTH TWICE A DAY (Patient taking differently: Take 500 mg by mouth 2 (two) times daily with a meal.)   methocarbamol (ROBAXIN) 500 MG tablet Take 500 mg by mouth every 8 (eight) hours as needed for muscle spasms. Take 1 tablet by mouth every hour as needed for muscle spasms   naloxone (NARCAN) nasal spray 4 mg/0.1 mL 1 spray  into nostril x1 and may repeat every 2-3 minutes until patient is responsive or EMS arrives   nitroGLYCERIN (NITROSTAT) 0.4 MG SL tablet Place 1 tablet (0.4 mg total) under the tongue every 5 (five) minutes as needed for chest pain.   omega-3 acid ethyl esters (LOVAZA) 1 g capsule Take 4 capsules (4 g total) by mouth daily.   omeprazole (PRILOSEC) 40 MG capsule TAKE 1 CAPSULE BY MOUTH 2 TIMES DAILY 30 MINUTES BEFORE BREAKFAST AND DINNER   oxyCODONE-acetaminophen (PERCOCET) 10-325 MG tablet TAKE 1 TABLET BY MOUTH EVERY 4 HOURS AS NEEDED FOR PAIN   SPIRIVA HANDIHALER 18 MCG inhalation capsule PLACE 1 CAPSULE INTO INHALER AND INHALE BY MOUTH ONCE A DAY.   torsemide (DEMADEX) 100 MG tablet Take 0.5 tablets (50 mg total) by mouth daily.   traZODone (DESYREL) 150 MG tablet TAKE 1 TABLET BY MOUTH AT BEDTIME   venlafaxine XR (EFFEXOR-XR) 75 MG 24 hr capsule TAKE 1 CAPSULE BY MOUTH DAILY WITH BREAKFAST   No facility-administered medications prior to visit.    Review of Systems  {Labs  Heme  Chem  Endocrine  Serology  Results Review (optional):23779}   Objective    There were no vitals taken for this visit. {Show previous vital signs (optional):23777}  Physical Exam  ***  No results found for any visits on 01/29/23.  Assessment & Plan     ***  No follow-ups on file.      {provider attestation***:1}   Mikey Kirschner, PA-C  [place] [phone] (phone) [phone] (fax)  [place]

## 2023-01-29 NOTE — Telephone Encounter (Signed)
  Chief Complaint: Lawrence [name] to ED last night because at 11:00 PM he noticed his left foot being cold, swollen and numb.   He has diabetes.    In the ED an U/S was done and circulation was fine.   Having muscle cramps.  Calf muscles were tight.  Discharged home Symptoms: This morning (Fri) his foot is still cold, pale but also red and dotted looking and numb.    It's very sensitive and he can feel but it's also numb.   Denies pins and needles feelings.   Bottom of foot is also swollen. Frequency: He noticed the coldness in left foot at 11:00 PM last night and called EMS which took him to [facility].     He is not happy with the care he received. Pertinent Negatives: Patient denies it being any better today.  He is concerned because of having diabetes and his foot being cold, pale and red that something is going on.   No history of blood clots. Disposition: [] ED /[] Urgent Care (no appt availability in office) / pointment(In office/virtual)/ []  [place] [facility]/ [] Home Care/ [] Refused Recommended Disposition /[] [place] [facility]/ []  Follow-up with PCP Additional Notes: I made him an appt. With Mikey Kirschner, PA-C for today at 2:00.    He has transportation issues and can't hardly walk also.   He is going to call a friend and see if they can bring him to the appt at 2:00.    If not he is going to cancel the appt. And call EMS to take him back over to the ED.    I let him know he needed to have the foot evaluated due to it being cold and numb.   Especially with him having diabetes.   He was agreeable to this plan.    He mentioned he was in the ED Tuesday evening for A. Fib.    He is on Eliquis.    Since he was just in the ED last night and I was able to get him in to be seen today I made him the appt.

## 2023-01-29 NOTE — ED Notes (Signed)
This RN reviewed paperwork with pt. No further complaints or questions. Pt ambulated to lobby. Signed discharge form placed in med rec box.  

## 2023-01-29 NOTE — Discharge Instructions (Signed)
Please follow-up with vascular surgery.  Please return for any new, worsening, or change in symptoms or other concerns.  It was a pleasure caring for you today.

## 2023-01-29 NOTE — ED Triage Notes (Signed)
Patient C/O left lower extremity pain and swelling that began around 2300. Patient denies any injury to the leg, but states he is worried he has a blood clot. Patient is on eliquis for afib.

## 2023-01-29 NOTE — Telephone Encounter (Signed)
Requested medication (s) are due for refill today: yes   Requested medication (s) are on the active medication list: yes   Last refill:  xanax- 01/06/23 #90 4 refills, oxycodone/percocet-01/05/23 #180 0 refills  Future visit scheduled: yes in 2 weeks  Notes to clinic:  not delegated per protocol. Do you want to refill Rxs?     Requested Prescriptions  Pending Prescriptions Disp Refills   ALPRAZolam (XANAX) 1 MG tablet 90 tablet 4    Sig: Take 1 tablet (1 mg total) by mouth 3 (three) times daily.     Not Delegated - Psychiatry: Anxiolytics/Hypnotics 2 Failed - 01/29/2023 12:10 PM      Failed - This refill cannot be delegated      Passed - Urine Drug Screen completed in last 360 days      Passed - Patient is not pregnant      Passed - Valid encounter within last 6 months    Recent Outpatient Visits           4 months ago Chronic respiratory failure with hypoxia [facility])   [place] Mikey Kirschner, PA-C   6 months ago [place], Donald E, MD   7 months ago Toe pain, left   [facility] Tally Joe T, FNP   10 months ago Type 2 diabetes mellitus with diabetic nephropathy, without long-term current use of insulin ([place])   [place], Donald E, MD   1 year ago OSA (obstructive sleep apnea)   [place], DO       Future Appointments             In 2 weeks [name], [name], MD [name]             oxyCODONE-acetaminophen (PERCOCET) 10-325 MG tablet 180 tablet 0    Sig: Take 1 tablet by mouth every 4 (four) hours as needed. for pain     Not Delegated - Analgesics:  Opioid Agonist Combinations Failed - 01/29/2023 12:10 PM      Failed - This refill cannot be delegated      Failed - Valid encounter within last 3 months    Recent Outpatient Visits           4  months ago Chronic respiratory failure with hypoxia [facility])   [place] Mikey Kirschner, PA-C   6 months ago [place], Donald E, MD   7 months ago Toe pain, left   [facility] Tally Joe T, FNP   10 months ago Type 2 diabetes mellitus with diabetic nephropathy, without long-term current use of insulin [facility])   [place], Donald E, MD   1 year ago OSA (obstructive sleep apnea)   [place], DO       Future Appointments             In 2 weeks [name], [name], MD [facility], [name]            Passed - Urine Drug Screen completed in last 360 days

## 2023-01-29 NOTE — Discharge Instructions (Addendum)
Your ultrasound did not show blood clot.  I suspect you may have spasm of your calf muscle.  You can continue to take your oxycodone and Tylenol for pain.  Please elevate the leg and you can use ice or heat.  If pain is not improving please follow-up with your primary care doctor.

## 2023-01-29 NOTE — Telephone Encounter (Signed)
Pt called back to tell us he will not be coming to the office visit scheduled.  He will call EMS to take him back to ED.  PT would like Dr. [name] to call the ED so he will be seen. Pt states that ED told him they were not going to do anything else.  PT wants refills of Xanax and oxycodone.
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
## 2023-01-29 NOTE — Telephone Encounter (Signed)
Reason for Disposition  Entire foot is cool or blue in comparison to other foot  Answer Assessment - Initial Assessment Questions 1. ONSET: "When did the pain start?"      Left foot is cold.    I went to the ED and they did an U/S and I have good circulation.   The calf muscle was real tight.   Everything looked fine.    Why was my leg cold?   They didn't say anything.    After discharge I was sitting in lobby.   Another person sitting in lobby said it was bad circulation.   I'm not pleased with the ED at [facility]. 2. LOCATION: "Where is the pain located?"      My left leg starting at the back of the muscle down to my foot and it's cold.    My foot is real cold.    My other foot is not feeling cold. 3. PAIN: "How bad is the pain?"    (Scale 1-10; or mild, moderate, severe)  - MILD (1-3): doesn't interfere with normal activities.   - MODERATE (4-7): interferes with normal activities (e.g., work or school) or awakens from sleep, limping.   - SEVERE (8-10): excruciating pain, unable to do any normal activities, unable to walk.      Left foot is real red.    Bottom of foot is swollen and cold and going up the leg it looks red and dotted looking and a little pale.    The veins in my legs looked like they could bust.    No history of blood clots.    I have diabetes and I'm worried.   The ED could not tell me anything.   4. WORK OR EXERCISE: "Has there been any recent work or exercise that involved this part of the body?"      11:00 PM I noticed it being cold.   It was so sensitive it could not touch the sheets.     I called the EMS after that and they took me to the ED.  I have transportation problems.    5. CAUSE: "What do you think is causing the foot pain?"     Poor circulation.    I've had a major heart attack with stints put in my heart before.      6. OTHER SYMPTOMS: "Do you have any other symptoms?" (e.g., leg pain, rash, fever, numbness)     Cold, swollen, numb, pale  in my left foot.  I can feel things but it's numb.    7. PREGNANCY: "Is there any chance you are pregnant?" "When was your last menstrual period?"     N/A  Protocols used: Foot Pain-A-AH
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
## 2023-01-29 NOTE — ED Notes (Signed)
ED Provider at bedside. 

## 2023-01-30 ENCOUNTER — Other Ambulatory Visit: Payer: Self-pay

## 2023-01-30 DIAGNOSIS — M7989 Other specified soft tissue disorders: Secondary | ICD-10-CM | POA: Diagnosis present

## 2023-01-30 DIAGNOSIS — M79605 Pain in left leg: Secondary | ICD-10-CM | POA: Diagnosis not present

## 2023-01-30 DIAGNOSIS — G8929 Other chronic pain: Secondary | ICD-10-CM | POA: Diagnosis not present

## 2023-01-30 DIAGNOSIS — I110 Hypertensive heart disease with heart failure: Secondary | ICD-10-CM | POA: Diagnosis not present

## 2023-01-30 DIAGNOSIS — J45909 Unspecified asthma, uncomplicated: Secondary | ICD-10-CM | POA: Insufficient documentation

## 2023-01-30 DIAGNOSIS — M79606 Pain in leg, unspecified: Secondary | ICD-10-CM | POA: Diagnosis not present

## 2023-01-30 DIAGNOSIS — E119 Type 2 diabetes mellitus without complications: Secondary | ICD-10-CM | POA: Insufficient documentation

## 2023-01-30 DIAGNOSIS — W19XXXA Unspecified fall, initial encounter: Secondary | ICD-10-CM | POA: Diagnosis not present

## 2023-01-30 DIAGNOSIS — J449 Chronic obstructive pulmonary disease, unspecified: Secondary | ICD-10-CM | POA: Insufficient documentation

## 2023-01-30 DIAGNOSIS — M79604 Pain in right leg: Secondary | ICD-10-CM | POA: Diagnosis not present

## 2023-01-30 DIAGNOSIS — Z87891 Personal history of nicotine dependence: Secondary | ICD-10-CM | POA: Insufficient documentation

## 2023-01-30 DIAGNOSIS — Z7984 Long term (current) use of oral hypoglycemic drugs: Secondary | ICD-10-CM | POA: Insufficient documentation

## 2023-01-30 DIAGNOSIS — I5032 Chronic diastolic (congestive) heart failure: Secondary | ICD-10-CM | POA: Insufficient documentation

## 2023-01-30 DIAGNOSIS — F1113 Opioid abuse with withdrawal: Secondary | ICD-10-CM | POA: Diagnosis not present

## 2023-01-30 DIAGNOSIS — Z79899 Other long term (current) drug therapy: Secondary | ICD-10-CM | POA: Insufficient documentation

## 2023-01-30 DIAGNOSIS — Z7901 Long term (current) use of anticoagulants: Secondary | ICD-10-CM | POA: Diagnosis not present

## 2023-01-30 DIAGNOSIS — I4891 Unspecified atrial fibrillation: Secondary | ICD-10-CM | POA: Diagnosis not present

## 2023-01-30 DIAGNOSIS — R0789 Other chest pain: Secondary | ICD-10-CM | POA: Diagnosis not present

## 2023-01-30 DIAGNOSIS — R609 Edema, unspecified: Secondary | ICD-10-CM | POA: Diagnosis not present

## 2023-01-30 DIAGNOSIS — I10 Essential (primary) hypertension: Secondary | ICD-10-CM | POA: Diagnosis not present

## 2023-01-30 NOTE — ED Triage Notes (Signed)
EMS called out for swelling in his legs and feet with weakness. Patient reports poor circulation, HX of DM, HTN.

## 2023-01-31 ENCOUNTER — Emergency Department
Admission: EM | Admit: 2023-01-31 | Discharge: 2023-01-31 | Disposition: A | Discharge status: Home / Self Care | Payer: Medicare HMO | Attending: Emergency Medicine | Admitting: Emergency Medicine

## 2023-01-31 DIAGNOSIS — F1193 Opioid use, unspecified with withdrawal: Secondary | ICD-10-CM

## 2023-01-31 DIAGNOSIS — Z765 Malingerer [conscious simulation]: Secondary | ICD-10-CM

## 2023-01-31 DIAGNOSIS — M79604 Pain in right leg: Secondary | ICD-10-CM | POA: Diagnosis not present

## 2023-01-31 DIAGNOSIS — I10 Essential (primary) hypertension: Secondary | ICD-10-CM

## 2023-01-31 DIAGNOSIS — G8929 Other chronic pain: Secondary | ICD-10-CM

## 2023-01-31 LAB — URINALYSIS, ROUTINE W REFLEX MICROSCOPIC
Bacteria, UA: NONE SEEN
Bilirubin Urine: NEGATIVE
Glucose, UA: >=500 mg/dL — AB
Ketones, ur: NEGATIVE mg/dL
Leukocytes,Ua: NEGATIVE
Nitrite: NEGATIVE
Protein, ur: 30 mg/dL — AB
Specific Gravity, Urine: 1.031 — ABNORMAL HIGH (ref 1.005–1.030)
pH: 5 (ref 5.0–8.0)

## 2023-01-31 LAB — CBC WITH DIFFERENTIAL/PLATELET
Abs Immature Granulocytes: 0.02 10*3/uL (ref 0.00–0.07)
Basophils Absolute: 0 10*3/uL (ref 0.0–0.1)
Basophils Relative: 1 %
Eosinophils Absolute: 0.2 10*3/uL (ref 0.0–0.5)
Eosinophils Relative: 3 %
HCT: 45.5 % (ref 39.0–52.0)
Hemoglobin: 14.5 g/dL (ref 13.0–17.0)
Immature Granulocytes: 0 %
Lymphocytes Relative: 29 %
Lymphs Abs: 1.9 10*3/uL (ref 0.7–4.0)
MCH: 28.5 pg (ref 26.0–34.0)
MCHC: 31.9 g/dL (ref 30.0–36.0)
MCV: 89.4 fL (ref 80.0–100.0)
Monocytes Absolute: 0.5 10*3/uL (ref 0.1–1.0)
Monocytes Relative: 7 %
Neutro Abs: 4 10*3/uL (ref 1.7–7.7)
Neutrophils Relative %: 60 %
Platelets: 160 10*3/uL (ref 150–400)
RBC: 5.09 MIL/uL (ref 4.22–5.81)
RDW: 15.9 % — ABNORMAL HIGH (ref 11.5–15.5)
WBC: 6.5 10*3/uL (ref 4.0–10.5)
nRBC: 0 % (ref 0.0–0.2)

## 2023-01-31 LAB — BASIC METABOLIC PANEL
Anion gap: 4 — ABNORMAL LOW (ref 5–15)
BUN: 15 mg/dL (ref 8–23)
CO2: 28 mmol/L (ref 22–32)
Calcium: 8.6 mg/dL — ABNORMAL LOW (ref 8.9–10.3)
Chloride: 104 mmol/L (ref 98–111)
Creatinine, Ser: 0.86 mg/dL (ref 0.61–1.24)
GFR, Estimated: >60 mL/min (ref >60)
Glucose, Bld: 135 mg/dL — ABNORMAL HIGH (ref 70–99)
Potassium: 3.6 mmol/L (ref 3.5–5.1)
Sodium: 136 mmol/L (ref 135–145)

## 2023-01-31 LAB — TROPONIN I (HIGH SENSITIVITY): Troponin I (High Sensitivity): 20 ng/L — ABNORMAL HIGH (ref <18)

## 2023-01-31 LAB — BRAIN NATRIURETIC PEPTIDE: B Natriuretic Peptide: 100.5 pg/mL — ABNORMAL HIGH (ref 0.0–100.0)

## 2023-01-31 MED ORDER — ONDANSETRON 4 MG PO TBDP
4.0000 mg | ORAL_TABLET | Freq: Once | ORAL | Status: AC
  Administered 2023-01-31: 4 mg via ORAL
  Filled 2023-01-31: qty 1

## 2023-01-31 MED ORDER — MORPHINE SULFATE (PF) 4 MG/ML IV SOLN
4.0000 mg | Freq: Once | INTRAVENOUS | Status: AC
  Administered 2023-01-31: 4 mg via INTRAMUSCULAR
  Filled 2023-01-31: qty 1

## 2023-01-31 NOTE — ED Provider Notes (Signed)
[facility] Provider Note    Event Date/Time   First MD Initiated Contact with Patient 01/31/23 [phone]     (approximate)   History   Leg Swelling   HPI  Lawrence [name] is a 67 y.o. male history of A-fib, CAD, CHF, COPD, hypertension, diabetes, chronic pain who presents to the emergency department for his third time in the past several days for bilateral leg pain.  States he is supposed to see vascular surgery tomorrow but does not think that he will have a ride and he states he is also out of his chronic oxycodone so he is requesting admission to the hospital so that he can get pain medication and see vascular surgery why he is here.  No new injury.  Recently had a Doppler of the left lower extremity that showed no DVT.  No chest pain or shortness of breath.  Was complaining of generalized weakness.  No fevers or cough.  Patient states he understands that he cannot have a refill of his oxycodone from the emergency department and states he does not think that this will help his pain and is requesting IM morphine.   History provided by patient.    Past Medical History:  Diagnosis Date   A-fib ([place])    Anemia    Anginal pain ([place])    Anxiety    Arthritis    Asthma    CAD (coronary artery disease)    a. 2002 CABGx2 (LIMA->LAD, VG->VG->OM1);  b. 09/2012 DES->OM;  c. 03/2015 PTCA of LAD [facility]) in setting of atretic LIMA; d. 05/2015 Cath [facility]): nonobs dzs; e. 06/2015 Cath ([facility]): LM nl, LAD 45p/d ISR, 50d, D1/2 small, LCX 50p/d ISR, OM1 70ost, 30 ISR, VG->OM1 50ost, 52m, LIMA->LAD 99p/d - atretic, RCA dom, nl; f.cath 10/16: 40-50%(FFR 0.90) pLAD, 75% (FFR 0.77) mLAD s/p PCI/DES, oRCA 40% (FFR0.95)   Cancer (HCC)    SKIN CANCER ON BACK   Celiac disease    Chronic diastolic CHF (congestive heart failure) ([place])    a. 06/2009 Echo: EF 60-65%, Gr 1 DD, triv AI, mildly dil LA, nl RV.   COPD (chronic obstructive pulmonary disease) ([place])    a. Chronic bronchitis and  emphysema.   DDD (degenerative disc disease), lumbar    Diverticulosis    Dysrhythmia    Essential hypertension    GERD (gastroesophageal reflux disease)    History of hiatal hernia    History of kidney stones    H/O   History of tobacco abuse    a. Quit 2014.   Myocardial infarction [facility]) 2002   4 STENTS   Pancreatitis    PSVT (paroxysmal supraventricular tachycardia)    a. 10/2012 Noted on Zio Patch.   Sleep apnea    LOST CORD TO CPAP -ONLY 02 @ BEDTIME   Tubular adenoma of colon    Type II diabetes mellitus ([place])     Past Surgical History:  Procedure Laterality Date   BYPASS GRAFT     CARDIAC CATHETERIZATION N/A 07/12/2015   rocedure: Left Heart Cath and Cors/Grafts Angiography;  Surgeon: Belva Crome, MD;  Location: [place] CV LAB;  Service: Cardiovascular;  Laterality: N/A;   CARDIAC CATHETERIZATION Right 10/07/2015   Procedure: Left Heart Cath and Cors/Grafts Angiography;  Surgeon: Dionisio David, MD;  Location: [place] [facility];  Service: Cardiovascular;  Laterality: Right;   CARDIAC CATHETERIZATION N/A 04/06/2016   Procedure: Left Heart Cath and Coronary Angiography;  Surgeon: Yolonda Kida,  MD;  Location: [place] [facility];  Service: Cardiovascular;  Laterality: N/A;   CARDIAC CATHETERIZATION  04/06/2016   Procedure: Bypass Graft Angiography;  Surgeon: Yolonda Kida, MD;  Location: [place] [facility] LAB;  Service: Cardiovascular;;   CARDIAC CATHETERIZATION N/A 11/02/2016   Procedure: Left Heart Cath and Cors/Grafts Angiography and possible PCI;  Surgeon: Yolonda Kida, MD;  Location: [place] [facility] LAB;  Service: Cardiovascular;  Laterality: N/A;   CARDIAC CATHETERIZATION N/A 11/02/2016   Procedure: Coronary Stent Intervention;  Surgeon: Yolonda Kida, MD;  Location: [place] [facility];  Service: Cardiovascular;  Laterality: N/A;   CARDIOVERSION N/A 06/29/2022   Procedure: CARDIOVERSION;  Surgeon: Corey Skains, MD;  Location: [facility] ORS;   Service: Cardiovascular;  Laterality: N/A;   CHOLECYSTECTOMY     CIRCUMCISION N/A 06/09/2019   Procedure: CIRCUMCISION ADULT;  Surgeon: Billey Co, MD;  Location: [facility] ORS;  Service: Urology;  Laterality: N/A;   COLONOSCOPY WITH PROPOFOL N/A 04/01/2018   Procedure: COLONOSCOPY WITH PROPOFOL;  Surgeon: Manya Silvas, MD;  Location: [facility] ENDOSCOPY;  Service: Endoscopy;  Laterality: N/A;   ESOPHAGEAL DILATION     ESOPHAGOGASTRODUODENOSCOPY (EGD) WITH PROPOFOL N/A 04/01/2018   Procedure: ESOPHAGOGASTRODUODENOSCOPY (EGD) WITH PROPOFOL;  Surgeon: Manya Silvas, MD;  Location: [facility] END[facility]COPY;  Service: Endoscopy;  Laterality: N/A;   LEFT HEART CATH AND CORS/GRAFTS ANGIOGRAPHY N/A 06/12/2019   Procedure: LEFT HEART CATH AND CORS/GRAFTS ANGIOGRAPHY;  Surgeon: Teodoro Spray, MD;  Location: [place] [facility] LAB;  Service: Cardiovascular;  Laterality: N/A;   LEFT HEART CATH AND CORS/GRAFTS ANGIOGRAPHY N/A 03/11/2020   Procedure: LEFT HEART CATH AND CORS/GRAFTS ANGIOGRAPHY;  Surgeon: Isaias Cowman, MD;  Location: [place] [facility];  Service: Cardiovascular;  Laterality: N/A;   LEFT HEART CATH AND CORS/GRAFTS ANGIOGRAPHY N/A 05/01/2021   Procedure: LEFT HEART CATH AND CORS/GRAFTS ANGIOGRAPHY;  Surgeon: Corey Skains, MD;  Location: [place] [facility];  Service: Cardiovascular;  Laterality: N/A;   LEFT HEART CATH AND CORS/GRAFTS ANGIOGRAPHY N/A 11/02/2022   Procedure: LEFT HEART CATH AND CORS/GRAFTS ANGIOGRAPHY;  Surgeon: Yolonda Kida, MD;  Location: [place] [facility] LAB;  Service: Cardiovascular;  Laterality: N/A;   TONSILLECTOMY     VASCULAR SURGERY      MEDICATIONS:  Prior to Admission medications   Medication Sig Start Date End Date Taking? Authorizing Provider  Accu-Chek Softclix Lancets lancets Use as instructed to check sugar daily for type 2 diabetes. 03/03/21   Birdie Sons, MD  acetaminophen (TYLENOL) 325 MG tablet Take 2 tablets (650 mg total) by mouth every 4  (four) hours as needed for headache or mild pain. 08/07/22   Lavina Hamman, MD  albuterol (VENTOLIN HFA) 108 (90 Base) MCG/ACT inhaler INHALE 2 PUFFS BY MOUTH EVERY 6 HOURS AS NEEDED FOR SHORTNESS OF BREATH 12/02/21   Birdie Sons, MD  allopurinol (ZYLOPRIM) 300 MG tablet TAKE 1 TABLET BY MOUTH TWICE A DAY Patient taking differently: Take 300 mg by mouth 2 (two) times daily. 10/15/21   Birdie Sons, MD  ALPRAZolam Duanne Moron) 1 MG tablet Take 1 tablet (1 mg total) by mouth 3 (three) times daily. 01/06/23   Birdie Sons, MD  aspirin 81 MG chewable tablet Chew 81 mg by mouth daily. Swallows whole    [provider]  atorvastatin (LIPITOR) 80 MG tablet Take 1 tablet (80 mg total) by mouth daily. Please schedule office visit before any future refill. 11/19/22   Birdie Sons,  MD  BELSOMRA 5 MG TABS TAKE 1 TABLET (5 MG TOTAL) BY MOUTH AT BEDTIME AS NEEDED Patient taking differently: Take 5 mg by mouth at bedtime as needed. 06/17/22   Birdie Sons, MD  Blood Glucose Calibration (ACCU-CHEK GUIDE CONTROL) LIQD Use with blood glucose monitor as directed 02/20/21   Birdie Sons, MD  Blood Glucose Monitoring Suppl (ACCU-CHEK GUIDE) w/Device KIT Use to check blood sugars as directed 02/20/21   Birdie Sons, MD  BREO ELLIPTA 100-25 MCG/ACT AEPB INHALE 1 PUFF BY MOUTH INTO THE LUNGS DAILY. 08/31/22   Birdie Sons, MD  celecoxib (CELEBREX) 200 MG capsule Take 1 capsule (200 mg total) by mouth 2 (two) times daily as needed. Home med. 11/03/22   Enzo Bi, MD  cetirizine (ZYRTEC) 10 MG tablet TAKE 1 TABLET BY MOUTH AT BEDTIME 04/16/22   Birdie Sons, MD  ELIQUIS 5 MG TABS tablet TAKE 1 TABLET BY MOUTH TWICE A DAY Patient taking differently: Take 5 mg by mouth 2 (two) times daily. 07/30/22   Birdie Sons, MD  FARXIGA 10 MG TABS tablet TAKE 1 TABLET BY MOUTH DAILY Patient taking differently: Take 10 mg by mouth daily. TAKE 1 TABLET BY MOUTH DAILY 02/09/22   Birdie Sons, MD  glucose  blood (ACCU-CHEK GUIDE) test strip Use as instructed to check sugar daily for type 2 diabetes. 03/03/21   Birdie Sons, MD  isosorbide mononitrate (IMDUR) 60 MG 24 hr tablet Take 1-2 tablets (60-120 mg total) by mouth daily. Home med. 11/03/22   Enzo Bi, MD  linaclotide [facility]) 72 MCG capsule Take 72 mcg by mouth daily before breakfast.    [provider]  lisinopril (ZESTRIL) 10 MG tablet TAKE 1 TABLET BY MOUTH DAILY Patient taking differently: Take 10 mg by mouth daily. 07/30/22   Birdie Sons, MD  meclizine (ANTIVERT) 25 MG tablet TAKE ONE TABLET BY THREE TIMES A DAY AS NEEDED FOR DIZZINESS Patient taking differently: Take 25 mg by mouth 3 (three) times daily. 08/13/22   Birdie Sons, MD  metFORMIN (GLUCOPHAGE-XR) 500 MG 24 hr tablet TAKE 1 TABLET BY MOUTH TWICE A DAY Patient taking differently: Take 500 mg by mouth 2 (two) times daily with a meal. 10/05/22   [name], [name], MD  methocarbamol (ROBAXIN) 500 MG tablet Take 500 mg by mouth every 8 (eight) hours as needed for muscle spasms. Take 1 tablet by mouth every hour as needed for muscle spasms    Birdie Sons, MD  naloxone [facility]) nasal spray 4 mg/0.1 mL 1 spray into nostril x1 and may repeat every 2-3 minutes until patient is responsive or EMS arrives 08/21/20   Birdie Sons, MD  nitroGLYCERIN (NITROSTAT) 0.4 MG SL tablet Place 1 tablet (0.4 mg total) under the tongue every 5 (five) minutes as needed for chest pain. 09/10/22   Mikey Kirschner, PA-C  omega-3 acid ethyl esters (LOVAZA) 1 g capsule Take 4 capsules (4 g total) by mouth daily. 09/10/22   Mikey Kirschner, PA-C  omeprazole (PRILOSEC) 40 MG capsule TAKE 1 CAPSULE BY MOUTH 2 TIMES DAILY 30 MINUTES BEFORE BREAKFAST AND DINNER 07/15/22   Birdie Sons, MD  oxyCODONE-acetaminophen (PERCOCET) 10-325 MG tablet TAKE 1 TABLET BY MOUTH EVERY 4 HOURS AS NEEDED FOR PAIN 01/05/23   Birdie Sons, MD  SPIRIVA HANDIHALER 18 MCG inhalation capsule PLACE 1 CAPSULE  INTO INHALER AND INHALE BY MOUTH ONCE A DAY. 06/02/22   Birdie Sons, MD  torsemide (DEMADEX) 100 MG tablet Take 0.5 tablets (50 mg total) by mouth daily. 09/10/22   Mikey Kirschner, PA-C  traZODone (DESYREL) 150 MG tablet TAKE 1 TABLET BY MOUTH AT BEDTIME 11/24/22   Birdie Sons, MD  venlafaxine XR (EFFEXOR-XR) 75 MG 24 hr capsule TAKE 1 CAPSULE BY MOUTH DAILY WITH BREAKFAST 12/16/21   Birdie Sons, MD    Physical Exam   Triage Vital Signs: ED Triage Vitals  Enc Vitals Group     BP 01/30/23 2239 (!) 143/115     Pulse Rate 01/30/23 2239 86     Resp 01/31/23 0136 20     Temp 01/30/23 2239 98.2 F (36.8 C)     Temp Source 01/30/23 2239 Oral     SpO2 01/30/23 2238 100 %     Weight 01/30/23 2241 220 lb 0.3 oz (99.8 kg)     Height 01/30/23 2241 5\' 7"  (1.702 m)     Head Circumference --      Peak Flow --      Pain Score 01/30/23 2240 10     Pain Loc --      Pain Edu? --      Excl. in [place]? --     Most recent vital signs: Vitals:   01/31/23 0500 01/31/23 0525  BP: (!) 159/105   Pulse: 65 66  Resp:  18  Temp:  97.8 F (36.6 C)  SpO2: 95% 96%    CONSTITUTIONAL: Alert, responds appropriately to questions.  Medically ill-appearing HEAD: Normocephalic, atraumatic EYES: Conjunctivae clear, pupils appear equal, sclera nonicteric ENT: normal nose; moist mucous membranes NECK: Supple, normal ROM CARD: RRR; S1 and S2 appreciated RESP: Normal chest excursion without splinting or tachypnea; breath sounds clear and equal bilaterally; no wheezes, no rhonchi, no rales, no hypoxia or respiratory distress, speaking full sentences ABD/GI: Non-distended; soft, non-tender, no rebound, no guarding, no peritoneal signs BACK: The back appears normal EXT: Normal ROM in all joints; no deformity noted, no pitting edema, no calf tenderness or calf swelling, no redness or increased warmth, no ecchymosis, compartments soft, palpable 2+ DP pulses bilaterally SKIN: Normal color for age and race;  warm; no rash on exposed skin NEURO: Moves all extremities equally, normal speech, ambulates with a cane which is his baseline PSYCH: The patient's mood and manner are appropriate.   ED Results / Procedures / Treatments   LABS: (all labs ordered are listed, but only abnormal results are displayed) Labs Reviewed  CBC WITH DIFFERENTIAL/PLATELET - Abnormal; Notable for the following components:      Result Value   RDW 15.9 (*)    All other components within normal limits  BASIC METABOLIC PANEL - Abnormal; Notable for the following components:   Glucose, Bld 135 (*)    Calcium 8.6 (*)    Anion gap 4 (*)    All other components within normal limits  BRAIN NATRIURETIC PEPTIDE - Abnormal; Notable for the following components:   B Natriuretic Peptide 100.5 (*)    All other components within normal limits  URINALYSIS, ROUTINE W REFLEX MICROSCOPIC - Abnormal; Notable for the following components:   Color, Urine YELLOW (*)    APPearance HAZY (*)    Specific Gravity, Urine 1.031 (*)    Glucose, UA >=500 (*)    Hgb urine dipstick SMALL (*)    Protein, ur 30 (*)    All other components within normal limits  TROPONIN I (HIGH SENSITIVITY) - Abnormal; Notable for the following components:  Troponin I (High Sensitivity) 20 (*)    All other components within normal limits     EKG:   RADIOLOGY: My personal review and interpretation of imaging:    I have personally reviewed all radiology reports.   No results found.   PROCEDURES:  Critical Care performed: No      Procedures    IMPRESSION / MDM / ASSESSMENT AND PLAN / ED COURSE  I reviewed the triage vital signs and the nursing notes.    Patient here with chronic leg pain requesting admission for pain control given he is out of his opiates at home and does not have a ride to see vascular surgery as scheduled tomorrow.    DIFFERENTIAL DIAGNOSIS (includes but not limited to):   Chronic pain, no sign of arterial  obstruction, DVT, fracture, compartment syndrome, gout, septic arthritis, cellulitis   Patient's presentation is most consistent with exacerbation of chronic illness.   PLAN: Initiated from triage.  No leukocytosis, normal hemoglobin, normal electrolytes.  Troponin is 20 which is lower than has been previously and near his baseline.  He is not having chest pain or shortness of breath.  No indication for serial enzymes.  His BNP is 100 which is downtrending from his previous.  He does not appear volume overloaded today.  He is concerned about his blood pressure but discussed with patient that I suspect he is having some opiate withdrawal since he has been out of his chronic oxycodone that this could cause his blood pressure to be elevated.  He does not appear in distress.  He states he does not think that oral oxycodone currently has been helping his pain and is requesting morphine.  I have seen patient several times in the emergency department for similar chronic situations where he requests IM morphine.  I am concerned that there is some component of drug-seeking behavior present.  Will give one-time dose of this medication but have advised patient that he will need to follow-up with his pain management doctor for further management of chronic pain.  As for his legs they are well-appearing today without signs of fracture, cellulitis, compartment syndrome, acute DVT or arterial obstruction.  There is no indication for further emergent workup.  He is ambulatory at his baseline.  He does also state that he is feeling weak overall.  Will obtain urinalysis.   MEDICATIONS GIVEN IN ED: Medications  morphine (PF) 4 MG/ML injection 4 mg (4 mg Intramuscular Given 01/31/23 0418)  ondansetron (ZOFRAN-ODT) disintegrating tablet 4 mg (4 mg Oral Given 01/31/23 0448)     ED COURSE: Urine shows no sign of infection or dehydration.  Blood pressures improved to the 150s/low 100s.  He states he is due to get a refill  of his pain medication today.  Discussed with patient at length that unfortunately we cannot admit him to the hospital for vascular surgery to see him as there is no indication for admission at this time or emergent/urgent vascular evaluation in the hospital especially given this seems to be chronic in nature.  He verbalized understanding.  Have recommended that he contact his vascular surgery office for further recommendations for follow-up.   At this time, I do not feel there is any life-threatening condition present. I reviewed all nursing notes, vitals, pertinent previous records.  All lab and urine results, EKGs, imaging ordered have been independently reviewed and interpreted by myself.  I reviewed all available radiology reports from any imaging ordered this visit.  Based on my  assessment, I feel the patient is safe to be discharged home without further emergent workup and can continue workup as an outpatient as needed. Discussed all findings, treatment plan as well as usual and customary return precautions.  They verbalize understanding and are comfortable with this plan.  Outpatient follow-up has been provided as needed.  All questions have been answered.    CONSULTS:  none   OUTSIDE RECORDS REVIEWED:  Reviewed last cardiology note in December 2023.  Reviewed recent family medicine notes.  It appears they have placed an order for home health, physical therapy.       FINAL CLINICAL IMPRESSION(S) / ED DIAGNOSES   Final diagnoses:  Chronic pain of both lower extremities  Uncontrolled hypertension  Opiate withdrawal ([place])  Drug-seeking behavior     Rx / DC Orders   ED Discharge Orders     None        Note:  This document was prepared using Dragon voice recognition software and may include unintentional dictation errors.   Lashante Fryberger, [place]ice [place], DO 01/31/23 [phone]

## 2023-02-01 MED ORDER — ALPRAZOLAM 1 MG PO TABS: 1.0000 mg | ORAL_TABLET | Freq: Three times a day (TID) | ORAL | 4 refills | Status: DC

## 2023-02-01 MED ORDER — OXYCODONE-ACETAMINOPHEN 10-325 MG PO TABS: 1.0000 | ORAL_TABLET | ORAL | 0 refills | Status: DC | PRN

## 2023-02-01 NOTE — Telephone Encounter (Signed)
Pt called to check on status of refill request for Alprazolam and Oxycodone / please advise

## 2023-02-02 ENCOUNTER — Ambulatory Visit (INDEPENDENT_AMBULATORY_CARE_PROVIDER_SITE_OTHER): Payer: Medicare HMO | Admitting: Pulmonary Disease

## 2023-02-02 ENCOUNTER — Encounter: Payer: Self-pay | Admitting: Pulmonary Disease

## 2023-02-02 VITALS — BP 160/100 | HR 74 | Temp 97.5°F | Ht 67.0 in | Wt 220.0 lb

## 2023-02-02 DIAGNOSIS — G4736 Sleep related hypoventilation in conditions classified elsewhere: Secondary | ICD-10-CM | POA: Diagnosis not present

## 2023-02-02 DIAGNOSIS — I4819 Other persistent atrial fibrillation: Secondary | ICD-10-CM

## 2023-02-02 DIAGNOSIS — I130 Hypertensive heart and chronic kidney disease with heart failure and stage 1 through stage 4 chronic kidney disease, or unspecified chronic kidney disease: Secondary | ICD-10-CM | POA: Diagnosis not present

## 2023-02-02 DIAGNOSIS — I10 Essential (primary) hypertension: Secondary | ICD-10-CM

## 2023-02-02 DIAGNOSIS — I739 Peripheral vascular disease, unspecified: Secondary | ICD-10-CM | POA: Diagnosis not present

## 2023-02-02 DIAGNOSIS — D631 Anemia in chronic kidney disease: Secondary | ICD-10-CM | POA: Diagnosis not present

## 2023-02-02 DIAGNOSIS — J439 Emphysema, unspecified: Secondary | ICD-10-CM

## 2023-02-02 DIAGNOSIS — I700 Atherosclerosis of aorta: Secondary | ICD-10-CM | POA: Diagnosis not present

## 2023-02-02 DIAGNOSIS — R911 Solitary pulmonary nodule: Secondary | ICD-10-CM

## 2023-02-02 DIAGNOSIS — E1122 Type 2 diabetes mellitus with diabetic chronic kidney disease: Secondary | ICD-10-CM | POA: Diagnosis not present

## 2023-02-02 DIAGNOSIS — J449 Chronic obstructive pulmonary disease, unspecified: Secondary | ICD-10-CM | POA: Diagnosis not present

## 2023-02-02 DIAGNOSIS — I482 Chronic atrial fibrillation, unspecified: Secondary | ICD-10-CM | POA: Diagnosis not present

## 2023-02-02 DIAGNOSIS — I5032 Chronic diastolic (congestive) heart failure: Secondary | ICD-10-CM | POA: Diagnosis not present

## 2023-02-02 DIAGNOSIS — J4489 Other specified chronic obstructive pulmonary disease: Secondary | ICD-10-CM | POA: Diagnosis not present

## 2023-02-02 DIAGNOSIS — I5023 Acute on chronic systolic (congestive) heart failure: Secondary | ICD-10-CM | POA: Diagnosis not present

## 2023-02-02 DIAGNOSIS — N182 Chronic kidney disease, stage 2 (mild): Secondary | ICD-10-CM | POA: Diagnosis not present

## 2023-02-02 MED ORDER — BREZTRI AEROSPHERE 160-9-4.8 MCG/ACT IN AERO: 2.0000 | INHALATION_SPRAY | Freq: Two times a day (BID) | RESPIRATORY_TRACT | 0 refills | Status: DC

## 2023-02-02 NOTE — Patient Instructions (Addendum)
We are going to get some breathing tests ordered.  You have a very small lung nodule on your right lung that is about the size of the pencil eraser on a mechanical pencil.  We are going to get a CT scan of the chest in 6 months (September)  We are going to give you a trial of an inhaler called Judithann Sauger this is 2 puffs twice a day.  When you start the second box of the inhaler please let us know how is it working with you if it is better than what you are currently using and we will call the prescription in for you.  Make sure you rinse your mouth well after you use the inhaler.  DO NOT USE THE BREO OR THE SPIRIVA WHILE ON THE BREZTRI.  We will see you in follow-up in 2 to 3 months time call sooner should any new problems arise.
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
## 2023-02-03 DIAGNOSIS — I700 Atherosclerosis of aorta: Secondary | ICD-10-CM | POA: Diagnosis not present

## 2023-02-03 DIAGNOSIS — N182 Chronic kidney disease, stage 2 (mild): Secondary | ICD-10-CM | POA: Diagnosis not present

## 2023-02-03 DIAGNOSIS — I5023 Acute on chronic systolic (congestive) heart failure: Secondary | ICD-10-CM | POA: Diagnosis not present

## 2023-02-03 DIAGNOSIS — I482 Chronic atrial fibrillation, unspecified: Secondary | ICD-10-CM | POA: Diagnosis not present

## 2023-02-03 DIAGNOSIS — I130 Hypertensive heart and chronic kidney disease with heart failure and stage 1 through stage 4 chronic kidney disease, or unspecified chronic kidney disease: Secondary | ICD-10-CM | POA: Diagnosis not present

## 2023-02-03 DIAGNOSIS — E1122 Type 2 diabetes mellitus with diabetic chronic kidney disease: Secondary | ICD-10-CM | POA: Diagnosis not present

## 2023-02-03 DIAGNOSIS — D631 Anemia in chronic kidney disease: Secondary | ICD-10-CM | POA: Diagnosis not present

## 2023-02-03 DIAGNOSIS — I5032 Chronic diastolic (congestive) heart failure: Secondary | ICD-10-CM | POA: Diagnosis not present

## 2023-02-03 DIAGNOSIS — J4489 Other specified chronic obstructive pulmonary disease: Secondary | ICD-10-CM | POA: Diagnosis not present

## 2023-02-04 ENCOUNTER — Emergency Department: Payer: Medicare HMO

## 2023-02-04 ENCOUNTER — Emergency Department
Admission: EM | Admit: 2023-02-04 | Discharge: 2023-02-04 | Disposition: A | Discharge status: Home / Self Care | Payer: Medicare HMO | Attending: Emergency Medicine | Admitting: Emergency Medicine

## 2023-02-04 ENCOUNTER — Encounter: Payer: Self-pay | Admitting: Emergency Medicine

## 2023-02-04 DIAGNOSIS — W19XXXA Unspecified fall, initial encounter: Secondary | ICD-10-CM

## 2023-02-04 DIAGNOSIS — W0110XA Fall on same level from slipping, tripping and stumbling with subsequent striking against unspecified object, initial encounter: Secondary | ICD-10-CM | POA: Diagnosis not present

## 2023-02-04 DIAGNOSIS — E119 Type 2 diabetes mellitus without complications: Secondary | ICD-10-CM | POA: Insufficient documentation

## 2023-02-04 DIAGNOSIS — G8929 Other chronic pain: Secondary | ICD-10-CM | POA: Insufficient documentation

## 2023-02-04 DIAGNOSIS — M545 Low back pain, unspecified: Secondary | ICD-10-CM | POA: Diagnosis not present

## 2023-02-04 DIAGNOSIS — R0902 Hypoxemia: Secondary | ICD-10-CM | POA: Diagnosis not present

## 2023-02-04 DIAGNOSIS — M8588 Other specified disorders of bone density and structure, other site: Secondary | ICD-10-CM | POA: Diagnosis not present

## 2023-02-04 DIAGNOSIS — Y92009 Unspecified place in unspecified non-institutional (private) residence as the place of occurrence of the external cause: Secondary | ICD-10-CM | POA: Diagnosis not present

## 2023-02-04 DIAGNOSIS — M549 Dorsalgia, unspecified: Secondary | ICD-10-CM | POA: Diagnosis not present

## 2023-02-04 DIAGNOSIS — I10 Essential (primary) hypertension: Secondary | ICD-10-CM | POA: Diagnosis not present

## 2023-02-04 DIAGNOSIS — T1490XA Injury, unspecified, initial encounter: Secondary | ICD-10-CM | POA: Diagnosis not present

## 2023-02-04 DIAGNOSIS — R0789 Other chest pain: Secondary | ICD-10-CM | POA: Diagnosis not present

## 2023-02-04 DIAGNOSIS — M25562 Pain in left knee: Secondary | ICD-10-CM | POA: Diagnosis not present

## 2023-02-04 MED ORDER — MORPHINE SULFATE (PF) 4 MG/ML IV SOLN
4.0000 mg | Freq: Once | INTRAVENOUS | Status: AC
  Administered 2023-02-04: 4 mg via INTRAMUSCULAR
  Filled 2023-02-04: qty 1

## 2023-02-04 NOTE — ED Provider Notes (Signed)
[facility] Provider Note    Event Date/Time   First MD Initiated Contact with Patient 02/04/23 2031     (approximate)   History   Fall   HPI  Lawrence [name] is a 67 y.o. male with history of diabetes, chronic pain, atrial fibrillation and as listed in EMR presents to the emergency department for treatment and evaluation of back pain after mechanical, nonsyncopal fall today at home.  Patient states that he got his foot caught under the bed frame which caused him to fall onto his buttocks.  He also states that he hit his head but did not experience any loss of consciousness.Marland Kitchen      Physical Exam   Triage Vital Signs: ED Triage Vitals  Enc Vitals Group     BP 02/04/23 1956 (!) 173/107     Pulse Rate 02/04/23 1956 86     Resp 02/04/23 1956 18     Temp 02/04/23 1956 97.9 F (36.6 C)     Temp Source 02/04/23 1956 Oral     SpO2 02/04/23 1956 98 %     Weight 02/04/23 1954 219 lb 12.8 oz (99.7 kg)     Height 02/04/23 1954 5\' 7"  (1.702 m)     Head Circumference --      Peak Flow --      Pain Score 02/04/23 1954 7     Pain Loc --      Pain Edu? --      Excl. in [place]? --     Most recent vital signs: Vitals:   02/04/23 1956  BP: (!) 173/107  Pulse: 86  Resp: 18  Temp: 97.9 F (36.6 C)  SpO2: 98%    General: Awake, no distress.  CV:  Good peripheral perfusion.  Resp:  Normal effort.  Abd:  No distention.  Other:  No focal midline tenderness along the spine.   ED Results / Procedures / Treatments   Labs (all labs ordered are listed, but only abnormal results are displayed) Labs Reviewed - No data to display   EKG     RADIOLOGY  Image and radiology report reviewed and interpreted by me. Radiology report consistent with the same.  Head CT negative for acute concerns. CT cervical spine negative for acute findings.  DG images of the lumbar spine and left knee show no new findings  PROCEDURES:  Critical Care performed:  No  Procedures   MEDICATIONS ORDERED IN ED:  Medications  morphine (PF) 4 MG/ML injection 4 mg (4 mg Intramuscular Given 02/04/23 2129)     IMPRESSION / MDM / ASSESSMENT AND PLAN / ED COURSE   I have reviewed the triage note.  Differential diagnosis includes, but is not limited to, head injury, vertebral injury, knee injury  Patient's presentation is most consistent with acute illness / injury with system symptoms.  67 year old male presenting to the emergency department for evaluation after mechanical nonsyncopal fall at home today.  Images are all reassuring.  Patient is already under a pain contract and has oxycodone at home.  He will be discharged and encouraged to take his medication only as prescribed.  He is to follow-up with his primary care provider for symptoms of concern.      FINAL CLINICAL IMPRESSION(S) / ED DIAGNOSES   Final diagnoses:  Fall in home, initial encounter  Other chronic pain     Rx / DC Orders   ED Discharge Orders     None  Note:  This document was prepared using Dragon voice recognition software and may include unintentional dictation errors.   Victorino Dike, FNP 02/04/23 2230    Harvest Dark, MD 02/04/23 2300

## 2023-02-04 NOTE — ED Triage Notes (Addendum)
Pt presents via [facility] in a wheelchair following a fall where he landed on his buttock and states he hit his head. No LOC. He notes taking an oxycodone around 1700 when he fell without any improvement. Pt was ambulatory following the fall. A&Ox4 at this time. Denies CP or SOB.

## 2023-02-05 ENCOUNTER — Encounter: Payer: Self-pay | Admitting: Family Medicine

## 2023-02-05 DIAGNOSIS — I130 Hypertensive heart and chronic kidney disease with heart failure and stage 1 through stage 4 chronic kidney disease, or unspecified chronic kidney disease: Secondary | ICD-10-CM | POA: Diagnosis not present

## 2023-02-05 DIAGNOSIS — N182 Chronic kidney disease, stage 2 (mild): Secondary | ICD-10-CM | POA: Diagnosis not present

## 2023-02-05 DIAGNOSIS — I700 Atherosclerosis of aorta: Secondary | ICD-10-CM | POA: Diagnosis not present

## 2023-02-05 DIAGNOSIS — E1122 Type 2 diabetes mellitus with diabetic chronic kidney disease: Secondary | ICD-10-CM | POA: Diagnosis not present

## 2023-02-05 DIAGNOSIS — I5032 Chronic diastolic (congestive) heart failure: Secondary | ICD-10-CM | POA: Diagnosis not present

## 2023-02-05 DIAGNOSIS — D631 Anemia in chronic kidney disease: Secondary | ICD-10-CM | POA: Diagnosis not present

## 2023-02-05 DIAGNOSIS — J9611 Chronic respiratory failure with hypoxia: Secondary | ICD-10-CM | POA: Diagnosis not present

## 2023-02-05 DIAGNOSIS — I482 Chronic atrial fibrillation, unspecified: Secondary | ICD-10-CM | POA: Diagnosis not present

## 2023-02-05 DIAGNOSIS — I2510 Atherosclerotic heart disease of native coronary artery without angina pectoris: Secondary | ICD-10-CM

## 2023-02-05 DIAGNOSIS — J4489 Other specified chronic obstructive pulmonary disease: Secondary | ICD-10-CM | POA: Diagnosis not present

## 2023-02-05 NOTE — Progress Notes (Signed)
Reviewed and agree with patient's detailed plan of care. Home Health Certification and Plan of Care from 01/11/2023 through 03/11/2023 signed and sent to medical records to be faxed to home health agency.

## 2023-02-06 ENCOUNTER — Emergency Department (HOSPITAL_COMMUNITY): Payer: Medicare HMO

## 2023-02-06 ENCOUNTER — Encounter (HOSPITAL_COMMUNITY): Payer: Self-pay | Admitting: *Deleted

## 2023-02-06 ENCOUNTER — Other Ambulatory Visit: Payer: Self-pay

## 2023-02-06 ENCOUNTER — Emergency Department (HOSPITAL_COMMUNITY)
Admission: EM | Admit: 2023-02-06 | Discharge: 2023-02-07 | Disposition: A | Discharge status: Home / Self Care | Payer: Medicare HMO | Attending: Emergency Medicine | Admitting: Emergency Medicine

## 2023-02-06 DIAGNOSIS — Z7901 Long term (current) use of anticoagulants: Secondary | ICD-10-CM | POA: Insufficient documentation

## 2023-02-06 DIAGNOSIS — Z7982 Long term (current) use of aspirin: Secondary | ICD-10-CM | POA: Diagnosis not present

## 2023-02-06 DIAGNOSIS — I482 Chronic atrial fibrillation, unspecified: Secondary | ICD-10-CM | POA: Diagnosis not present

## 2023-02-06 DIAGNOSIS — I2510 Atherosclerotic heart disease of native coronary artery without angina pectoris: Secondary | ICD-10-CM | POA: Diagnosis not present

## 2023-02-06 DIAGNOSIS — R079 Chest pain, unspecified: Secondary | ICD-10-CM | POA: Diagnosis not present

## 2023-02-06 DIAGNOSIS — I10 Essential (primary) hypertension: Secondary | ICD-10-CM | POA: Insufficient documentation

## 2023-02-06 DIAGNOSIS — R0789 Other chest pain: Secondary | ICD-10-CM | POA: Insufficient documentation

## 2023-02-06 DIAGNOSIS — I5023 Acute on chronic systolic (congestive) heart failure: Secondary | ICD-10-CM | POA: Diagnosis not present

## 2023-02-06 DIAGNOSIS — Z79899 Other long term (current) drug therapy: Secondary | ICD-10-CM | POA: Insufficient documentation

## 2023-02-06 DIAGNOSIS — I4891 Unspecified atrial fibrillation: Secondary | ICD-10-CM | POA: Insufficient documentation

## 2023-02-06 DIAGNOSIS — Z7984 Long term (current) use of oral hypoglycemic drugs: Secondary | ICD-10-CM | POA: Insufficient documentation

## 2023-02-06 DIAGNOSIS — N182 Chronic kidney disease, stage 2 (mild): Secondary | ICD-10-CM | POA: Diagnosis not present

## 2023-02-06 DIAGNOSIS — R0689 Other abnormalities of breathing: Secondary | ICD-10-CM | POA: Diagnosis not present

## 2023-02-06 DIAGNOSIS — J45909 Unspecified asthma, uncomplicated: Secondary | ICD-10-CM | POA: Diagnosis not present

## 2023-02-06 DIAGNOSIS — E119 Type 2 diabetes mellitus without complications: Secondary | ICD-10-CM | POA: Insufficient documentation

## 2023-02-06 DIAGNOSIS — E1122 Type 2 diabetes mellitus with diabetic chronic kidney disease: Secondary | ICD-10-CM | POA: Diagnosis not present

## 2023-02-06 DIAGNOSIS — I130 Hypertensive heart and chronic kidney disease with heart failure and stage 1 through stage 4 chronic kidney disease, or unspecified chronic kidney disease: Secondary | ICD-10-CM | POA: Diagnosis not present

## 2023-02-06 DIAGNOSIS — D631 Anemia in chronic kidney disease: Secondary | ICD-10-CM | POA: Diagnosis not present

## 2023-02-06 DIAGNOSIS — I5032 Chronic diastolic (congestive) heart failure: Secondary | ICD-10-CM | POA: Diagnosis not present

## 2023-02-06 DIAGNOSIS — I700 Atherosclerosis of aorta: Secondary | ICD-10-CM | POA: Diagnosis not present

## 2023-02-06 DIAGNOSIS — J4489 Other specified chronic obstructive pulmonary disease: Secondary | ICD-10-CM | POA: Diagnosis not present

## 2023-02-06 LAB — CBC
HCT: 45.4 % (ref 39.0–52.0)
Hemoglobin: 14.5 g/dL (ref 13.0–17.0)
MCH: 28.6 pg (ref 26.0–34.0)
MCHC: 31.9 g/dL (ref 30.0–36.0)
MCV: 89.5 fL (ref 80.0–100.0)
Platelets: 172 10*3/uL (ref 150–400)
RBC: 5.07 MIL/uL (ref 4.22–5.81)
RDW: 15.6 % — ABNORMAL HIGH (ref 11.5–15.5)
WBC: 6.6 10*3/uL (ref 4.0–10.5)
nRBC: 0 % (ref 0.0–0.2)

## 2023-02-06 LAB — BRAIN NATRIURETIC PEPTIDE: B Natriuretic Peptide: 152.6 pg/mL — ABNORMAL HIGH (ref 0.0–100.0)

## 2023-02-06 LAB — BASIC METABOLIC PANEL
Anion gap: 10 (ref 5–15)
BUN: 12 mg/dL (ref 8–23)
CO2: 29 mmol/L (ref 22–32)
Calcium: 8.8 mg/dL — ABNORMAL LOW (ref 8.9–10.3)
Chloride: 99 mmol/L (ref 98–111)
Creatinine, Ser: 0.98 mg/dL (ref 0.61–1.24)
GFR, Estimated: >60 mL/min (ref >60)
Glucose, Bld: 101 mg/dL — ABNORMAL HIGH (ref 70–99)
Potassium: 3.7 mmol/L (ref 3.5–5.1)
Sodium: 138 mmol/L (ref 135–145)

## 2023-02-06 LAB — TROPONIN I (HIGH SENSITIVITY)
Troponin I (High Sensitivity): 13 ng/L (ref <18)
Troponin I (High Sensitivity): 15 ng/L (ref <18)

## 2023-02-06 MED ORDER — KETOROLAC TROMETHAMINE 15 MG/ML IJ SOLN
15.0000 mg | Freq: Once | INTRAMUSCULAR | Status: AC
  Administered 2023-02-06: 15 mg via INTRAVENOUS
  Filled 2023-02-06: qty 1

## 2023-02-06 MED ORDER — MORPHINE SULFATE (PF) 4 MG/ML IV SOLN
4.0000 mg | Freq: Once | INTRAVENOUS | Status: AC
  Administered 2023-02-06: 4 mg via INTRAVENOUS
  Filled 2023-02-06: qty 1

## 2023-02-06 MED ORDER — ONDANSETRON HCL 4 MG/2ML IJ SOLN
4.0000 mg | Freq: Once | INTRAMUSCULAR | Status: AC
  Administered 2023-02-06: 4 mg via INTRAVENOUS
  Filled 2023-02-06: qty 2

## 2023-02-06 NOTE — ED Triage Notes (Signed)
The pt is c/o rt sided chest pain for 2 days cough  no temp

## 2023-02-06 NOTE — Discharge Instructions (Signed)
Thank you for letting us take care of you today.  Overall, your workup was reassuring. We do not see signs of damage to your heart and you had 2 negative heart enzymes and the rest of your workup looked ok today.  You seem to have tenderness with pushing on your chest so this is likely musculoskeletal pain. Please continue to take your home pain medications and use other treatments such as heating pad, massage, over the counter medications, and stretching to help with your symptoms.   Please follow up with your PCP next week to discuss your ED visit today. I do not think at this time you need a follow up appointment with cardiology but discuss at your PCP appointment if they think you should see cardiology soon.   For any new or worsening symptoms, please return to nearest emergency department for re-evaluation.

## 2023-02-06 NOTE — ED Provider Notes (Signed)
[facility] Provider Note   CSN: [id number] Arrival date & time: 02/06/23  2021     History {Add pertinent medical, surgical, social history, OB history to HPI:1} Chief Complaint  Patient presents with   Chest Pain    Lawrence [name] is a 67 y.o. male with past medical history type 2 diabetes, CAD, anxiety, arthritis, asthma, A-fib, hypertension who presents to the ED complaining of right-sided chest pain for the last 2 days.  Patient reports that the pain comes and goes but does happen frequently.  He is unable to adequately describe the pain.  He states that he is on chronic opioids at home and is taking these as prescribed but they are not helping with his chest pain.  He denies associated diaphoresis, syncope, lightheadedness, dizziness, nausea, vomiting, diarrhea, abdominal pain, or other symptoms.  Of note, patient does note recent fall a few days ago for which she was evaluated at [facility].  States that he did hit his face and injured his knee during this fall but both of these were evaluated at that visit with no acute finding.      Home Medications Prior to Admission medications   Medication Sig Start Date End Date Taking? Authorizing Provider  Accu-Chek Softclix Lancets lancets Use as instructed to check sugar daily for type 2 diabetes. 03/03/21   Birdie Sons, MD  acetaminophen (TYLENOL) 325 MG tablet Take 2 tablets (650 mg total) by mouth every 4 (four) hours as needed for headache or mild pain. 08/07/22   Lavina Hamman, MD  albuterol (VENTOLIN HFA) 108 (90 Base) MCG/ACT inhaler INHALE 2 PUFFS BY MOUTH EVERY 6 HOURS AS NEEDED FOR SHORTNESS OF BREATH 12/02/21   Birdie Sons, MD  allopurinol (ZYLOPRIM) 300 MG tablet TAKE 1 TABLET BY MOUTH TWICE A DAY Patient taking differently: Take 300 mg by mouth 2 (two) times daily. 10/15/21   Birdie Sons, MD  ALPRAZolam Duanne Moron) 1 MG tablet Take 1 tablet (1 mg total) by mouth 3 (three)  times daily. 02/01/23   Birdie Sons, MD  aspirin 81 MG chewable tablet Chew 81 mg by mouth daily. Swallows whole    [provider]  atorvastatin (LIPITOR) 80 MG tablet Take 1 tablet (80 mg total) by mouth daily. Please schedule office visit before any future refill. 11/19/22   Birdie Sons, MD  BELSOMRA 5 MG TABS TAKE 1 TABLET (5 MG TOTAL) BY MOUTH AT BEDTIME AS NEEDED Patient taking differently: Take 5 mg by mouth at bedtime as needed. 06/17/22   Birdie Sons, MD  Blood Glucose Calibration (ACCU-CHEK GUIDE CONTROL) LIQD Use with blood glucose monitor as directed 02/20/21   Birdie Sons, MD  Blood Glucose Monitoring Suppl (ACCU-CHEK GUIDE) w/Device KIT Use to check blood sugars as directed 02/20/21   Birdie Sons, MD  Budeson-Glycopyrrol-Formoterol (BREZTRI AEROSPHERE) 160-9-4.8 MCG/ACT AERO Inhale 2 puffs into the lungs in the morning and at bedtime. 02/02/23   Tyler Pita, MD  celecoxib (CELEBREX) 200 MG capsule Take 1 capsule (200 mg total) by mouth 2 (two) times daily as needed. Home med. 11/03/22   Enzo Bi, MD  cetirizine (ZYRTEC) 10 MG tablet TAKE 1 TABLET BY MOUTH AT BEDTIME 04/16/22   Birdie Sons, MD  ELIQUIS 5 MG TABS tablet TAKE 1 TABLET BY MOUTH TWICE A DAY Patient taking differently: Take 5 mg by mouth 2 (two) times daily. 07/30/22   Birdie Sons, MD  FARXIGA 10 MG TABS tablet TAKE 1 TABLET BY MOUTH DAILY Patient taking differently: Take 10 mg by mouth daily. TAKE 1 TABLET BY MOUTH DAILY 02/09/22   Birdie Sons, MD  glucose blood (ACCU-CHEK GUIDE) test strip Use as instructed to check sugar daily for type 2 diabetes. 03/03/21   Birdie Sons, MD  isosorbide mononitrate (IMDUR) 60 MG 24 hr tablet Take 1-2 tablets (60-120 mg total) by mouth daily. Home med. 11/03/22   Enzo Bi, MD  linaclotide [facility]) 72 MCG capsule Take 72 mcg by mouth daily before breakfast.    [provider]  lisinopril (ZESTRIL) 10 MG tablet TAKE 1 TABLET BY MOUTH  DAILY Patient taking differently: Take 10 mg by mouth daily. 07/30/22   Birdie Sons, MD  meclizine (ANTIVERT) 25 MG tablet TAKE ONE TABLET BY THREE TIMES A DAY AS NEEDED FOR DIZZINESS Patient taking differently: Take 25 mg by mouth 3 (three) times daily. 08/13/22   Birdie Sons, MD  metFORMIN (GLUCOPHAGE-XR) 500 MG 24 hr tablet TAKE 1 TABLET BY MOUTH TWICE A DAY Patient taking differently: Take 500 mg by mouth 2 (two) times daily with a meal. 10/05/22   [name], [name], MD  methocarbamol (ROBAXIN) 500 MG tablet Take 500 mg by mouth every 8 (eight) hours as needed for muscle spasms. Take 1 tablet by mouth every hour as needed for muscle spasms    Birdie Sons, MD  naloxone [facility]) nasal spray 4 mg/0.1 mL 1 spray into nostril x1 and may repeat every 2-3 minutes until patient is responsive or EMS arrives 08/21/20   Birdie Sons, MD  nitroGLYCERIN (NITROSTAT) 0.4 MG SL tablet Place 1 tablet (0.4 mg total) under the tongue every 5 (five) minutes as needed for chest pain. 09/10/22   Mikey Kirschner, PA-C  omega-3 acid ethyl esters (LOVAZA) 1 g capsule Take 4 capsules (4 g total) by mouth daily. 09/10/22   Mikey Kirschner, PA-C  omeprazole (PRILOSEC) 40 MG capsule TAKE 1 CAPSULE BY MOUTH 2 TIMES DAILY 30 MINUTES BEFORE BREAKFAST AND DINNER 07/15/22   Birdie Sons, MD  oxyCODONE-acetaminophen (PERCOCET) 10-325 MG tablet Take 1 tablet by mouth every 4 (four) hours as needed. for pain 02/01/23   Birdie Sons, MD  torsemide (DEMADEX) 100 MG tablet Take 0.5 tablets (50 mg total) by mouth daily. 09/10/22   Mikey Kirschner, PA-C  traZODone (DESYREL) 150 MG tablet TAKE 1 TABLET BY MOUTH AT BEDTIME 11/24/22   Birdie Sons, MD  venlafaxine XR (EFFEXOR-XR) 75 MG 24 hr capsule TAKE 1 CAPSULE BY MOUTH DAILY WITH BREAKFAST 12/16/21   Birdie Sons, MD      Allergies    Prednisone, Demerol  [meperidine hcl], Demerol [meperidine], Jardiance [empagliflozin], Sulfa antibiotics, Albuterol sulfate  [albuterol], and Morphine sulfate    Review of Systems   Review of Systems  All other systems reviewed and are negative.   Physical Exam Updated Vital Signs BP (!) 142/88   Pulse (!) 108   Temp 97.9 F (36.6 C)   Resp 15   Ht 5\' 7"  (1.702 m)   Wt 99.7 kg   SpO2 96%   BMI 34.43 kg/m  Physical Exam Vitals and nursing note reviewed.  Constitutional:      General: He is not in acute distress.    Appearance: Normal appearance. He is not ill-appearing, toxic-appearing or diaphoretic.  HENT:     Head: Normocephalic and atraumatic.     Mouth/Throat:  Mouth: Mucous membranes are moist.  Eyes:     Extraocular Movements: Extraocular movements intact.     Conjunctiva/sclera: Conjunctivae normal.     Pupils: Pupils are equal, round, and reactive to light.  Cardiovascular:     Rate and Rhythm: Regular rhythm. Tachycardia present.     Heart sounds: No murmur heard. Pulmonary:     Effort: Pulmonary effort is normal.     Breath sounds: Normal breath sounds. No decreased breath sounds, wheezing, rhonchi or rales.  Chest:     Chest wall: Tenderness (to right superior chest wall which reproduces complaint) present.  Abdominal:     General: Abdomen is flat.     Palpations: Abdomen is soft. There is no mass.     Tenderness: There is no abdominal tenderness. There is no guarding or rebound.  Musculoskeletal:        General: Normal range of motion.     Cervical back: Neck supple.     Right lower leg: No tenderness. No edema.     Left lower leg: No tenderness. No edema.  Skin:    General: Skin is warm and dry.     Capillary Refill: Capillary refill takes less than 2 seconds.  Neurological:     General: No focal deficit present.     Mental Status: He is alert and oriented to person, place, and time. Mental status is at baseline.  Psychiatric:        Behavior: Behavior normal.     ED Results / Procedures / Treatments   Labs (all labs ordered are listed, but only abnormal results  are displayed) Labs Reviewed  BASIC METABOLIC PANEL - Abnormal; Notable for the following components:      Result Value   Glucose, Bld 101 (*)    Calcium 8.8 (*)    All other components within normal limits  CBC - Abnormal; Notable for the following components:   RDW 15.6 (*)    All other components within normal limits  BRAIN NATRIURETIC PEPTIDE - Abnormal; Notable for the following components:   B Natriuretic Peptide 152.6 (*)    All other components within normal limits  TROPONIN I (HIGH SENSITIVITY)  TROPONIN I (HIGH SENSITIVITY)    EKG A-fib 73bpm, no acute ST-T changes  Radiology DG Chest 2 View  Result Date: 02/06/2023 CLINICAL DATA:  Right-sided chest pain. EXAM: CHEST - 2 VIEW COMPARISON:  Chest radiograph dated 01/27/2023. FINDINGS: No focal consolidation, pleural effusion, or pneumothorax. There is mild cardiomegaly with mild central vascular congestion. Median sternotomy wires and CABG vascular clips. No acute osseous pathology. IMPRESSION: Mild cardiomegaly with mild central vascular congestion. No focal consolidation. Electronically Signed   By: Anner Crete M.D.   On: 02/06/2023 21:31    Procedures Procedures  {Document cardiac monitor, telemetry assessment procedure when appropriate:1}  Medications Ordered in ED Medications  morphine (PF) 4 MG/ML injection 4 mg (4 mg Intravenous Given 02/06/23 2136)  ondansetron (ZOFRAN) injection 4 mg (4 mg Intravenous Given 02/06/23 2136)    ED Course/ Medical Decision Making/ A&P   {   Click here for ABCD2, HEART and other calculatorsREFRESH Note before signing :1}                          Medical Decision Making Amount and/or Complexity of Data Reviewed Labs: ordered. Decision-making details documented in ED Course. Radiology: ordered. Decision-making details documented in ED Course. ECG/medicine tests: ordered. Decision-making details documented in ED Course.  Risk Prescription drug management.   Medical  Decision Making:   LEONIDES SWILLEY is a 67 y.o. male who presented to the ED today with chest pain detailed above.    External chart has been reviewed including previous cardiac cath. Patient's presentation is complicated by their history of CAD, chronic pain.  Patient placed on continuous vitals and telemetry monitoring while in ED which was reviewed periodically.  Complete initial physical exam performed, notably the patient  was neurologically intact. He had area of tenderness to palpation over right superior chest wall that reproduced his complaint. No overlying skin changes. No deformities. No significant LE edema or skin changes. No calf tenderness. Abdomen soft, nontender. Slightly tachycardic but otherwise unremarkable heart and lung exam. .    Reviewed and confirmed nursing documentation for past medical history, family history, social history.    Initial Assessment:   With the patient's presentation of chest pain, most likely diagnosis is chest wall pain.The emergent differential diagnosis of chest pain includes: Acute coronary syndrome, pericarditis, aortic dissection, pulmonary embolism, tension pneumothorax, and esophageal rupture. I do not believe the patient has an emergent cause of chest pain, other urgent/non-acute considerations include, but are not limited to: chronic angina, aortic stenosis, cardiomyopathy, myocarditis, mitral valve prolapse, pulmonary hypertension, hypertrophic obstructive cardiomyopathy (HOCM), aortic insufficiency, right ventricular hypertrophy, pneumonia, pleuritis, bronchitis, pneumothorax, tumor, gastroesophageal reflux disease (GERD), esophageal spasm, Mallory-[name] syndrome, peptic ulcer disease, biliary disease, pancreatitis, functional gastrointestinal pain, cervical or thoracic disk disease or arthritis, shoulder arthritis, costochondritis, subacromial bursitis, anxiety or panic attack, herpes zoster, breast disorders, chest wall tumors, thoracic outlet  syndrome, mediastinitis.   This is most consistent with an acute complicated illness   Cardiac cath from December 2023  Brief cath note   Patient brought to the cardiac Cath Lab because of persistent anginal symptoms.  Patient has known coronary disease previous coronary bypass surgery but persistent daily chronic angina last cath was June 2022   Cath Mild reduced left ventricular function with anterior apical hypokinesis EF around 40 to 45% Coronaries Left main large minor irregularities LAD large widely patent stents proximal and mid minor irregularities Circumflex large minor irregularities OM1 patent graft RCA large minor irregularities All vessels with TIMI-3 flow   Grafts SVG to OM1 widely patent LIMA to LAD atretic previously known   Conclusion Cath with no new obstructive coronary disease Images look improved from previous cath 18 months ago.  No indication or evidence for his persistent chest pain which suggest that this is probably noncardiac chest pain and not truly angina should treat patient medically hopefully can be discharged tomorrow.      Initial Plan:  Screening labs including CBC and Metabolic panel to evaluate for infectious or metabolic etiology of disease.  CXR to evaluate for structural/infectious intrathoracic pathology.  EKG and troponin to evaluate for cardiac pathology BNP to assess for heart failure Dose of pain medicine for symptomatic management Objective evaluation as reviewed   Initial Study Results:   Laboratory  All laboratory results reviewed without evidence of clinically relevant pathology.   Exceptions include: ***   EKG EKG was reviewed independently. ST segments without concerns for elevations.   EKG: atrial fibrillation, rate 73.   Radiology:  All images reviewed independently. Agree with radiology report at this time.   Narrative & Impression  CLINICAL DATA:  Right-sided chest pain.   EXAM: CHEST - 2 VIEW   COMPARISON:   Chest radiograph dated 01/27/2023.   FINDINGS: No focal consolidation, pleural effusion,  or pneumothorax. There is mild cardiomegaly with mild central vascular congestion. Median sternotomy wires and CABG vascular clips. No acute osseous pathology.   IMPRESSION: Mild cardiomegaly with mild central vascular congestion. No focal consolidation.     Electronically Signed   By: Anner Crete M.D.   On: 02/06/2023 21:31      Consults: Case discussed with ***.   Final Assessment and Plan:   ***    Clinical Impression: No diagnosis found.   Data Unavailable    {Document critical care time when appropriate:1} {Document review of labs and clinical decision tools ie heart score, Chads2Vasc2 etc:1}  {Document your independent review of radiology images, and any outside records:1} {Document your discussion with family members, caretakers, and with consultants:1} {Document social determinants of health affecting pt's care:1} {Document your decision making why or why not admission, treatments were needed:1} Final Clinical Impression(s) / ED Diagnoses Final diagnoses:  None    Rx / DC Orders ED Discharge Orders     None

## 2023-02-06 NOTE — ED Notes (Signed)
Placed on 2L O2 d/t 88% on RA p Morphine

## 2023-02-09 ENCOUNTER — Emergency Department: Payer: Medicare HMO

## 2023-02-09 ENCOUNTER — Other Ambulatory Visit: Payer: Self-pay

## 2023-02-09 ENCOUNTER — Emergency Department
Admission: EM | Admit: 2023-02-09 | Discharge: 2023-02-10 | Disposition: A | Discharge status: Home / Self Care | Payer: Medicare HMO | Attending: Emergency Medicine | Admitting: Emergency Medicine

## 2023-02-09 ENCOUNTER — Ambulatory Visit: Payer: Self-pay

## 2023-02-09 DIAGNOSIS — I110 Hypertensive heart disease with heart failure: Secondary | ICD-10-CM | POA: Insufficient documentation

## 2023-02-09 DIAGNOSIS — J9811 Atelectasis: Secondary | ICD-10-CM | POA: Diagnosis not present

## 2023-02-09 DIAGNOSIS — J449 Chronic obstructive pulmonary disease, unspecified: Secondary | ICD-10-CM | POA: Insufficient documentation

## 2023-02-09 DIAGNOSIS — R079 Chest pain, unspecified: Secondary | ICD-10-CM | POA: Diagnosis not present

## 2023-02-09 DIAGNOSIS — Z7901 Long term (current) use of anticoagulants: Secondary | ICD-10-CM | POA: Insufficient documentation

## 2023-02-09 DIAGNOSIS — I509 Heart failure, unspecified: Secondary | ICD-10-CM | POA: Diagnosis not present

## 2023-02-09 DIAGNOSIS — Z9861 Coronary angioplasty status: Secondary | ICD-10-CM | POA: Insufficient documentation

## 2023-02-09 DIAGNOSIS — R0789 Other chest pain: Secondary | ICD-10-CM | POA: Insufficient documentation

## 2023-02-09 DIAGNOSIS — I5032 Chronic diastolic (congestive) heart failure: Secondary | ICD-10-CM | POA: Diagnosis not present

## 2023-02-09 DIAGNOSIS — N182 Chronic kidney disease, stage 2 (mild): Secondary | ICD-10-CM | POA: Diagnosis not present

## 2023-02-09 DIAGNOSIS — R0682 Tachypnea, not elsewhere classified: Secondary | ICD-10-CM | POA: Diagnosis not present

## 2023-02-09 DIAGNOSIS — D631 Anemia in chronic kidney disease: Secondary | ICD-10-CM | POA: Diagnosis not present

## 2023-02-09 DIAGNOSIS — R071 Chest pain on breathing: Secondary | ICD-10-CM | POA: Diagnosis not present

## 2023-02-09 DIAGNOSIS — E1122 Type 2 diabetes mellitus with diabetic chronic kidney disease: Secondary | ICD-10-CM | POA: Diagnosis not present

## 2023-02-09 DIAGNOSIS — J4489 Other specified chronic obstructive pulmonary disease: Secondary | ICD-10-CM | POA: Diagnosis not present

## 2023-02-09 DIAGNOSIS — I10 Essential (primary) hypertension: Secondary | ICD-10-CM | POA: Diagnosis not present

## 2023-02-09 DIAGNOSIS — I2510 Atherosclerotic heart disease of native coronary artery without angina pectoris: Secondary | ICD-10-CM | POA: Insufficient documentation

## 2023-02-09 DIAGNOSIS — I482 Chronic atrial fibrillation, unspecified: Secondary | ICD-10-CM | POA: Diagnosis not present

## 2023-02-09 DIAGNOSIS — I130 Hypertensive heart and chronic kidney disease with heart failure and stage 1 through stage 4 chronic kidney disease, or unspecified chronic kidney disease: Secondary | ICD-10-CM | POA: Diagnosis not present

## 2023-02-09 DIAGNOSIS — I5023 Acute on chronic systolic (congestive) heart failure: Secondary | ICD-10-CM | POA: Diagnosis not present

## 2023-02-09 DIAGNOSIS — I700 Atherosclerosis of aorta: Secondary | ICD-10-CM | POA: Diagnosis not present

## 2023-02-09 LAB — CBC
HCT: 42.5 % (ref 39.0–52.0)
Hemoglobin: 13.3 g/dL (ref 13.0–17.0)
MCH: 28.5 pg (ref 26.0–34.0)
MCHC: 31.3 g/dL (ref 30.0–36.0)
MCV: 91 fL (ref 80.0–100.0)
Platelets: 146 10*3/uL — ABNORMAL LOW (ref 150–400)
RBC: 4.67 MIL/uL (ref 4.22–5.81)
RDW: 15.9 % — ABNORMAL HIGH (ref 11.5–15.5)
WBC: 8.8 10*3/uL (ref 4.0–10.5)
nRBC: 0 % (ref 0.0–0.2)

## 2023-02-09 LAB — BASIC METABOLIC PANEL
Anion gap: 6 (ref 5–15)
BUN: 15 mg/dL (ref 8–23)
CO2: 27 mmol/L (ref 22–32)
Calcium: 9 mg/dL (ref 8.9–10.3)
Chloride: 106 mmol/L (ref 98–111)
Creatinine, Ser: 1.02 mg/dL (ref 0.61–1.24)
GFR, Estimated: >60 mL/min (ref >60)
Glucose, Bld: 115 mg/dL — ABNORMAL HIGH (ref 70–99)
Potassium: 3.8 mmol/L (ref 3.5–5.1)
Sodium: 139 mmol/L (ref 135–145)

## 2023-02-09 LAB — TROPONIN I (HIGH SENSITIVITY)
Troponin I (High Sensitivity): 14 ng/L (ref <18)
Troponin I (High Sensitivity): 16 ng/L (ref <18)

## 2023-02-09 MED ORDER — NITROGLYCERIN 0.4 MG SL SUBL
0.4000 mg | SUBLINGUAL_TABLET | SUBLINGUAL | Status: DC | PRN
  Administered 2023-02-09 (×3): 0.4 mg via SUBLINGUAL
  Filled 2023-02-09 (×3): qty 1

## 2023-02-09 MED ORDER — KETOROLAC TROMETHAMINE 15 MG/ML IJ SOLN
15.0000 mg | Freq: Once | INTRAMUSCULAR | Status: AC
  Administered 2023-02-09: 15 mg via INTRAVENOUS
  Filled 2023-02-09: qty 1

## 2023-02-09 NOTE — ED Provider Notes (Signed)
[facility] Provider Note    Event Date/Time   First MD Initiated Contact with Patient 02/09/23 2027     (approximate)   History   No chief complaint on file.   HPI  Lawrence [name] is a 67 y.o. male past medical history significant for atrial fibrillation on anticoagulation, CAD, CHF, COPD on chronic 3 L, hypertension, chronic chest pain, who presents to the emergency department for chest pain.  Endorses chest pain that has been ongoing for the past couple of hours.  States that this is not the same as chest pain but he is having lung pain this time.  States that he took his oxycodone at home without significant improvement of his pain.  Took 324 mg of aspirin prior to arrival.  Denies nausea or vomiting.  Endorses compliance with his anticoagulation.  Denies history of DVT or PE.  Asking for morphine.     Physical Exam   Triage Vital Signs: ED Triage Vitals [02/09/23 2034]  Enc Vitals Group     BP      Pulse      Resp      Temp      Temp src      SpO2      Weight 196 lb (88.9 kg)     Height 5\' 7"  (1.702 m)     Head Circumference      Peak Flow      Pain Score 10     Pain Loc      Pain Edu?      Excl. in [place]?     Most recent vital signs: Vitals:   02/09/23 2200 02/09/23 2230  BP: (!) 154/98 (!) 162/96  Pulse: 76 73  Resp: (!) 21 17  Temp:    SpO2: 93% 93%    Physical Exam Constitutional:      Appearance: He is well-developed.  HENT:     Head: Atraumatic.  Eyes:     Conjunctiva/sclera: Conjunctivae normal.  Cardiovascular:     Rate and Rhythm: Regular rhythm.     Heart sounds: No murmur heard. Pulmonary:     Effort: No respiratory distress.     Breath sounds: No wheezing.     Comments: 3 L nasal cannula Musculoskeletal:        General: Normal range of motion.     Cervical back: Normal range of motion.     Right lower leg: No edema.     Left lower leg: No edema.  Skin:    General: Skin is warm.     Capillary Refill:  Capillary refill takes less than 2 seconds.  Neurological:     Mental Status: He is alert. Mental status is at baseline.  Psychiatric:        Mood and Affect: Mood normal.     IMPRESSION / MDM / [place] / ED COURSE  I reviewed the triage vital signs and the nursing notes.  On chart review patient has been seen multiple times in the emergency department at this hospital and at [facility] for chest pain.  Patient was just evaluated 2 days ago with similar symptoms.  Patient had a cardiac catheterization done 10/2022 that did not show any findings consistent with coronary artery disease.  There is been multiple ED visits in the past, last ED visit concern for possible pain seeking behaviors from chronic pain.  Patient had an ultrasound duplex within the past 2 weeks that showed no acute findings.  Differential diagnosis including ACS, pneumonia, COPD exacerbation, chronic pain, pneumothorax  EKG  I, Nathaniel Man, the attending physician, personally viewed and interpreted this ECG.   Rate: Normal  Rhythm: A-fib  Axis: Normal  Intervals: Normal  ST&T Change: T wave flattening/inverted I and aVL.  No significant ST elevation or depression.  No tachycardic or bradycardic dysrhythmias while on cardiac telemetry.  RADIOLOGY I independently reviewed imaging, my interpretation of imaging: Chest x-ray with no signs of pneumonia.  No widened mediastinum.  LABS (all labs ordered are listed, but only abnormal results are displayed) Labs interpreted as -    Labs Reviewed  CBC - Abnormal; Notable for the following components:      Result Value   RDW 15.9 (*)    Platelets 146 (*)    All other components within normal limits  BASIC METABOLIC PANEL - Abnormal; Notable for the following components:   Glucose, Bld 115 (*)    All other components within normal limits  TROPONIN I (HIGH SENSITIVITY)  TROPONIN I (HIGH SENSITIVITY)     MDM  Patient received aspirin 324 mg prior to  arrival.  Patient given nitroglycerin.  Lab work without significant leukocytosis or anemia.  Initial troponin is negative.  Creatinine at baseline with no significant electrolyte abnormalities.  Low suspicion for pulmonary embolism, low risk Wells criteria and compliant with anticoagulation.  No pleuritic chest pain.  Initial troponin negative, atypical chest pain, recent cardiac catheterization that showed no signs of coronary artery disease.  Plan for second troponin and if negative will discharge home with cardiology and primary care follow-up.    PROCEDURES:  Critical Care performed: No  Procedures  Patient's presentation is most consistent with acute illness / injury with system symptoms.   MEDICATIONS ORDERED IN ED: Medications  nitroGLYCERIN (NITROSTAT) SL tablet 0.4 mg (0.4 mg Sublingual Given 02/09/23 2144)  ketorolac (TORADOL) 15 MG/ML injection 15 mg (15 mg Intravenous Given 02/09/23 2318)    FINAL CLINICAL IMPRESSION(S) / ED DIAGNOSES   Final diagnoses:  Chest pain, unspecified type     Rx / DC Orders   ED Discharge Orders     None        Note:  This document was prepared using Dragon voice recognition software and may include unintentional dictation errors.   Nathaniel Man, MD 02/09/23 2324

## 2023-02-09 NOTE — Discharge Instructions (Addendum)
Take your medicines as prescribed by your doctor.  Return to the ER for worsening symptoms or other concerns.

## 2023-02-09 NOTE — ED Notes (Signed)
Pt reports taking 4 baby aspirin and 3 nitro from EMS PTA

## 2023-02-09 NOTE — ED Triage Notes (Signed)
Pt coming from home via EMS for right lung pain since 3pm. Spot on lungs found 3 weeks ago by PCP. No relief from prescribed oxycodone taken at 3pm. Pt states pain came on suddenly while sitting on cough watching television. Pt describes pain as being in right side of chest radiating around to back and being worse on inspiration. Ambulatory with assistance. VVS. Hx. Heart attack with 4 stents placed (on eloquis), afib. CBG 148. Pt on 3L [place] at night. 96-98% on RA for EMS.

## 2023-02-09 NOTE — Telephone Encounter (Signed)
  Chief Complaint: Upper right chest pain Symptoms: pain 10/10 Frequency: a couple of hours Pertinent Negatives: Patient denies  Disposition: [x] ED /[] Urgent Care (no appt availability in office) / [] Appointment(In office/virtual)/ []  [facility]/ [] Home Care/ [] Refused Recommended Disposition /[] [facility]/ []  Follow-up with PCP Additional Notes: PT reports sever upper right chest pain. He states it is lung pain where a spot was discovered.  Pt reports weakness and dizziness. PT has hx of both heart and lung problems. PT will go to ED for care.    Reason for Disposition  SEVERE chest pain  Answer Assessment - Initial Assessment Questions 1. LOCATION: "Where does it hurt?"       Upper right side 2. RADIATION: "Does the pain go anywhere else?" (e.g., into neck, jaw, arms, back)     Wants to work its way down 3. ONSET: "When did the chest pain begin?" (Minutes, hours or days)      A couple of hours 4. PATTERN: "Does the pain come and go, or has it been constant since it started?"  "Does it get worse with exertion?"      Constant 5. DURATION: "How long does it last" (e.g., seconds, minutes, hours)     A couple of  6. SEVERITY: "How bad is the pain?"  (e.g., Scale 1-10; mild, moderate, or severe)    - MILD (1-3): doesn't interfere with normal activities     - MODERATE (4-7): interferes with normal activities or awakens from sleep    - SEVERE (8-10): excruciating pain, unable to do any normal activities       10/10 7. CARDIAC RISK FACTORS: "Do you have any history of heart problems or risk factors for heart disease?" (e.g., angina, prior heart attack; diabetes, high blood pressure, high cholesterol, smoker, or strong family history of heart disease)     Yes - stents - bypass  8. PULMONARY RISK FACTORS: "Do you have any history of lung disease?"  (e.g., blood clots in lung, asthma, emphysema, birth control pills)     Copd bronchitis 9. CAUSE: "What do you think is  causing the chest pain?"     Unsure 10. OTHER SYMPTOMS: "Do you have any other symptoms?" (e.g., dizziness, nausea, vomiting, sweating, fever, difficulty breathing, cough)       Dizziness, weakness  Protocols used: Chest Pain-A-AH

## 2023-02-10 DIAGNOSIS — I482 Chronic atrial fibrillation, unspecified: Secondary | ICD-10-CM | POA: Diagnosis not present

## 2023-02-10 DIAGNOSIS — I5023 Acute on chronic systolic (congestive) heart failure: Secondary | ICD-10-CM | POA: Diagnosis not present

## 2023-02-10 DIAGNOSIS — N182 Chronic kidney disease, stage 2 (mild): Secondary | ICD-10-CM | POA: Diagnosis not present

## 2023-02-10 DIAGNOSIS — I5032 Chronic diastolic (congestive) heart failure: Secondary | ICD-10-CM | POA: Diagnosis not present

## 2023-02-10 DIAGNOSIS — I700 Atherosclerosis of aorta: Secondary | ICD-10-CM | POA: Diagnosis not present

## 2023-02-10 DIAGNOSIS — E1122 Type 2 diabetes mellitus with diabetic chronic kidney disease: Secondary | ICD-10-CM | POA: Diagnosis not present

## 2023-02-10 DIAGNOSIS — J4489 Other specified chronic obstructive pulmonary disease: Secondary | ICD-10-CM | POA: Diagnosis not present

## 2023-02-10 DIAGNOSIS — D631 Anemia in chronic kidney disease: Secondary | ICD-10-CM | POA: Diagnosis not present

## 2023-02-10 DIAGNOSIS — I130 Hypertensive heart and chronic kidney disease with heart failure and stage 1 through stage 4 chronic kidney disease, or unspecified chronic kidney disease: Secondary | ICD-10-CM | POA: Diagnosis not present

## 2023-02-11 ENCOUNTER — Telehealth: Payer: Self-pay

## 2023-02-11 DIAGNOSIS — I5023 Acute on chronic systolic (congestive) heart failure: Secondary | ICD-10-CM | POA: Diagnosis not present

## 2023-02-11 DIAGNOSIS — I700 Atherosclerosis of aorta: Secondary | ICD-10-CM | POA: Diagnosis not present

## 2023-02-11 DIAGNOSIS — E1122 Type 2 diabetes mellitus with diabetic chronic kidney disease: Secondary | ICD-10-CM | POA: Diagnosis not present

## 2023-02-11 DIAGNOSIS — I130 Hypertensive heart and chronic kidney disease with heart failure and stage 1 through stage 4 chronic kidney disease, or unspecified chronic kidney disease: Secondary | ICD-10-CM | POA: Diagnosis not present

## 2023-02-11 DIAGNOSIS — J4489 Other specified chronic obstructive pulmonary disease: Secondary | ICD-10-CM | POA: Diagnosis not present

## 2023-02-11 DIAGNOSIS — D631 Anemia in chronic kidney disease: Secondary | ICD-10-CM | POA: Diagnosis not present

## 2023-02-11 DIAGNOSIS — I482 Chronic atrial fibrillation, unspecified: Secondary | ICD-10-CM | POA: Diagnosis not present

## 2023-02-11 DIAGNOSIS — I5032 Chronic diastolic (congestive) heart failure: Secondary | ICD-10-CM | POA: Diagnosis not present

## 2023-02-11 DIAGNOSIS — N182 Chronic kidney disease, stage 2 (mild): Secondary | ICD-10-CM | POA: Diagnosis not present

## 2023-02-11 NOTE — Telephone Encounter (Signed)
        Patient  visited [facility] on 3/27  Telephone encounter attempt :  1st  A HIPAA compliant voice message was left requesting a return call.  Instructed patient to call back.    [place] [phone] [address] [place], [place], [place] [address] Phone: [phone] Email: [email or web address]

## 2023-02-12 ENCOUNTER — Telehealth: Payer: Self-pay

## 2023-02-12 DIAGNOSIS — I5032 Chronic diastolic (congestive) heart failure: Secondary | ICD-10-CM | POA: Diagnosis not present

## 2023-02-12 DIAGNOSIS — D631 Anemia in chronic kidney disease: Secondary | ICD-10-CM | POA: Diagnosis not present

## 2023-02-12 DIAGNOSIS — J4489 Other specified chronic obstructive pulmonary disease: Secondary | ICD-10-CM | POA: Diagnosis not present

## 2023-02-12 DIAGNOSIS — I130 Hypertensive heart and chronic kidney disease with heart failure and stage 1 through stage 4 chronic kidney disease, or unspecified chronic kidney disease: Secondary | ICD-10-CM | POA: Diagnosis not present

## 2023-02-12 DIAGNOSIS — I700 Atherosclerosis of aorta: Secondary | ICD-10-CM | POA: Diagnosis not present

## 2023-02-12 DIAGNOSIS — I5023 Acute on chronic systolic (congestive) heart failure: Secondary | ICD-10-CM | POA: Diagnosis not present

## 2023-02-12 DIAGNOSIS — N182 Chronic kidney disease, stage 2 (mild): Secondary | ICD-10-CM | POA: Diagnosis not present

## 2023-02-12 DIAGNOSIS — I482 Chronic atrial fibrillation, unspecified: Secondary | ICD-10-CM | POA: Diagnosis not present

## 2023-02-12 DIAGNOSIS — E1122 Type 2 diabetes mellitus with diabetic chronic kidney disease: Secondary | ICD-10-CM | POA: Diagnosis not present

## 2023-02-12 NOTE — Telephone Encounter (Signed)
        Patient  visited [facility] on 3/27     Telephone encounter attempt :  2nd  A HIPAA compliant voice message was left requesting a return call.  Instructed patient to call back .    [place] [phone] [address], [place], [place] [address] Phone: [phone] Email: [email or web address]

## 2023-02-15 ENCOUNTER — Ambulatory Visit (INDEPENDENT_AMBULATORY_CARE_PROVIDER_SITE_OTHER): Payer: Medicare HMO | Admitting: Family Medicine

## 2023-02-15 ENCOUNTER — Inpatient Hospital Stay: Payer: Medicare HMO | Admitting: Family Medicine

## 2023-02-15 VITALS — BP 133/77 | HR 100 | Temp 98.6°F | Wt 197.0 lb

## 2023-02-15 DIAGNOSIS — R0781 Pleurodynia: Secondary | ICD-10-CM

## 2023-02-15 DIAGNOSIS — L309 Dermatitis, unspecified: Secondary | ICD-10-CM

## 2023-02-15 DIAGNOSIS — F5101 Primary insomnia: Secondary | ICD-10-CM | POA: Diagnosis not present

## 2023-02-15 MED ORDER — BELSOMRA 10 MG PO TABS: 10.0000 mg | ORAL_TABLET | Freq: Every evening | ORAL | 4 refills | Status: DC | PRN

## 2023-02-15 MED ORDER — PREDNISONE 10 MG PO TABS: ORAL_TABLET | ORAL | 0 refills | Status: DC

## 2023-02-15 MED ORDER — METRONIDAZOLE 1 % EX GEL: Freq: Every day | CUTANEOUS | 3 refills | Status: DC

## 2023-02-15 NOTE — Progress Notes (Signed)
[place],acting as a scribe for [name], MD.,have documented all relevant documentation on the behalf of [name], MD,as directed by  [name], MD while in the presence of [name], MD.     Established patient visit   Patient: Lawrence [name]   DOB: 13-Apr-1956   67 y.o. Male  MRN: [id number] Visit Date: 02/15/2023  Today's healthcare provider: [name], MD   No chief complaint on file.  Subjective    HPI  Follow up Hospitalization  Patient was seen at [facility] ER on 02/09/23 He was treated for right sided chest pain. Treatment for this included Nitroglycerin and Toradol. Telephone follow up was attempted on 2 occasions but was unsuccessful.   Patient states he was having lung pain in the ER and not chest pain.  Is being followed by Dr. Patsey Berthold for right pulmonary nodule. Is exacerbated by deep breaths and coughing.    He also states he needs to have Belsomra refill He states it helps sleep, but not a sound sleep.   He states the pain is the unimproved and has not been able to get relief. He states the pain causes him to have difficulty breathing and walking.    ----------------------------------------------------------------------------------------- - He also has scattered erythema on face with sores on left side of chine and midline of scalp.   Medications: Outpatient Medications Prior to Visit  Medication Sig   Accu-Chek Softclix Lancets lancets Use as instructed to check sugar daily for type 2 diabetes.   acetaminophen (TYLENOL) 325 MG tablet Take 2 tablets (650 mg total) by mouth every 4 (four) hours as needed for headache or mild pain.   allopurinol (ZYLOPRIM) 300 MG tablet TAKE 1 TABLET BY MOUTH TWICE A DAY (Patient taking differently: Take 300 mg by mouth 2 (two) times daily.)   ALPRAZolam (XANAX) 1 MG tablet Take 1 tablet (1 mg total) by mouth 3 (three) times daily.   amiodarone (PACERONE) 200 MG tablet Take 200 mg by mouth 2 (two)  times daily.   aspirin 81 MG chewable tablet Chew 81 mg by mouth daily. Swallows whole   atorvastatin (LIPITOR) 80 MG tablet Take 1 tablet (80 mg total) by mouth daily. Please schedule office visit before any future refill.   BELSOMRA 5 MG TABS TAKE 1 TABLET (5 MG TOTAL) BY MOUTH AT BEDTIME AS NEEDED (Patient taking differently: Take 5 mg by mouth at bedtime as needed.)   Blood Glucose Calibration (ACCU-CHEK GUIDE CONTROL) LIQD Use with blood glucose monitor as directed   Blood Glucose Monitoring Suppl (ACCU-CHEK GUIDE) w/Device KIT Use to check blood sugars as directed   Budeson-Glycopyrrol-Formoterol (BREZTRI AEROSPHERE) 160-9-4.8 MCG/ACT AERO Inhale 2 puffs into the lungs in the morning and at bedtime.   celecoxib (CELEBREX) 200 MG capsule Take 1 capsule (200 mg total) by mouth 2 (two) times daily as needed. Home med.   cetirizine (ZYRTEC) 10 MG tablet TAKE 1 TABLET BY MOUTH AT BEDTIME   ELIQUIS 5 MG TABS tablet TAKE 1 TABLET BY MOUTH TWICE A DAY (Patient taking differently: Take 5 mg by mouth 2 (two) times daily.)   FARXIGA 10 MG TABS tablet TAKE 1 TABLET BY MOUTH DAILY (Patient taking differently: Take 10 mg by mouth daily. TAKE 1 TABLET BY MOUTH DAILY)   glucose blood (ACCU-CHEK GUIDE) test strip Use as instructed to check sugar daily for type 2 diabetes.   isosorbide mononitrate (IMDUR) 60 MG 24 hr tablet Take 1-2 tablets (60-120 mg total) by mouth daily.  Home med.   linaclotide (LINZESS) 72 MCG capsule Take 72 mcg by mouth daily before breakfast.   lisinopril (ZESTRIL) 10 MG tablet TAKE 1 TABLET BY MOUTH DAILY (Patient taking differently: Take 10 mg by mouth daily.)   meclizine (ANTIVERT) 25 MG tablet TAKE ONE TABLET BY THREE TIMES A DAY AS NEEDED FOR DIZZINESS (Patient taking differently: Take 25 mg by mouth 3 (three) times daily.)   metFORMIN (GLUCOPHAGE-XR) 500 MG 24 hr tablet TAKE 1 TABLET BY MOUTH TWICE A DAY (Patient taking differently: Take 500 mg by mouth 2 (two) times daily with a  meal.)   methocarbamol (ROBAXIN) 500 MG tablet Take 500 mg by mouth every 8 (eight) hours as needed for muscle spasms. Take 1 tablet by mouth every hour as needed for muscle spasms   naloxone (NARCAN) nasal spray 4 mg/0.1 mL 1 spray into nostril x1 and may repeat every 2-3 minutes until patient is responsive or EMS arrives   nitroGLYCERIN (NITROSTAT) 0.4 MG SL tablet Place 1 tablet (0.4 mg total) under the tongue every 5 (five) minutes as needed for chest pain.   omega-3 acid ethyl esters (LOVAZA) 1 g capsule Take 4 capsules (4 g total) by mouth daily.   omeprazole (PRILOSEC) 40 MG capsule TAKE 1 CAPSULE BY MOUTH 2 TIMES DAILY 30 MINUTES BEFORE BREAKFAST AND DINNER   oxyCODONE-acetaminophen (PERCOCET) 10-325 MG tablet Take 1 tablet by mouth every 4 (four) hours as needed. for pain   torsemide (DEMADEX) 100 MG tablet Take 0.5 tablets (50 mg total) by mouth daily.   traZODone (DESYREL) 150 MG tablet TAKE 1 TABLET BY MOUTH AT BEDTIME   venlafaxine XR (EFFEXOR-XR) 75 MG 24 hr capsule TAKE 1 CAPSULE BY MOUTH DAILY WITH BREAKFAST   albuterol (VENTOLIN HFA) 108 (90 Base) MCG/ACT inhaler INHALE 2 PUFFS BY MOUTH EVERY 6 HOURS AS NEEDED FOR SHORTNESS OF BREATH (Patient not taking: Reported on 02/15/2023)   No facility-administered medications prior to visit.        Objective    BP 133/77 (BP Location: Left Arm, Patient Position: Sitting, Cuff Size: Normal)   Pulse 100   Temp 98.6 F (37 C) (Oral)   Wt 197 lb (89.4 kg)   SpO2 97%   BMI 30.85 kg/m    Physical Exam   General: Appearance:    Obese male in no acute distress  Eyes:    PERRL, conjunctiva/corneas clear, EOM's intact       Lungs:     Clear to auscultation bilaterally, respirations unlabored  Heart:    Tachycardic. Irregularly irregular rhythm. No murmurs, rubs, or gallops.    MS:   All extremities are intact.    Neurologic:   Awake, alert, oriented x 3. No apparent focal neurological defect.         Assessment & Plan     1.  Pleuritic chest pain  - predniSONE (DELTASONE) 10 MG tablet; 6 tablets for 1 day, then 5 for 1 day, then 4 for 1 day, then 3 for 1 day, then 2 for 1 day then 1 for 1 day.  Dispense: 21 tablet; Refill: 0  Follow up pulmonary as scheduled.    2. Primary insomnia Increase from 5mg  to Suvorexant (BELSOMRA) 10 MG TABS; Take 1 tablet (10 mg total) by mouth at bedtime as needed.  Dispense: 30 tablet; Refill: 4  3. Dermatitis Try metroNIDAZOLE (METROGEL) 1 % gel; Apply topically daily.  Dispense: 45 g; Refill: 3       The entirety of  the information documented in the History of Present Illness, Review of Systems and Physical Exam were personally obtained by me. Portions of this information were initially documented by the CMA and reviewed by me for thoroughness and accuracy.     [name], MD  [facility] [phone] (phone) [phone] (fax)  [place]

## 2023-02-15 NOTE — Progress Notes (Deleted)
Established patient visit   Patient: Lawrence [name]   DOB: 09/22/1956   67 y.o. Male  MRN: [id number] Visit Date: 02/15/2023  Today's healthcare provider: [name], MD   No chief complaint on file.  Subjective    HPI  Follow up Hospitalization  Patient was seen at [facility] ER on 02/09/23. He was treated for unspecified chest pain. Treatment for this included Nitroglycerin and Toradol. Telephone follow up was attempted on 2 separate occasions without success. He reports {excellent/good/fair:19992} compliance with treatment. He reports this condition is {resolved/improved/worsened:23923}.  ----------------------------------------------------------------------------------------- -   Medications: Outpatient Medications Prior to Visit  Medication Sig   Accu-Chek Softclix Lancets lancets Use as instructed to check sugar daily for type 2 diabetes.   acetaminophen (TYLENOL) 325 MG tablet Take 2 tablets (650 mg total) by mouth every 4 (four) hours as needed for headache or mild pain.   albuterol (VENTOLIN HFA) 108 (90 Base) MCG/ACT inhaler INHALE 2 PUFFS BY MOUTH EVERY 6 HOURS AS NEEDED FOR SHORTNESS OF BREATH   allopurinol (ZYLOPRIM) 300 MG tablet TAKE 1 TABLET BY MOUTH TWICE A DAY (Patient taking differently: Take 300 mg by mouth 2 (two) times daily.)   ALPRAZolam (XANAX) 1 MG tablet Take 1 tablet (1 mg total) by mouth 3 (three) times daily.   aspirin 81 MG chewable tablet Chew 81 mg by mouth daily. Swallows whole   atorvastatin (LIPITOR) 80 MG tablet Take 1 tablet (80 mg total) by mouth daily. Please schedule office visit before any future refill.   BELSOMRA 5 MG TABS TAKE 1 TABLET (5 MG TOTAL) BY MOUTH AT BEDTIME AS NEEDED (Patient taking differently: Take 5 mg by mouth at bedtime as needed.)   Blood Glucose Calibration (ACCU-CHEK GUIDE CONTROL) LIQD Use with blood glucose monitor as directed   Blood Glucose Monitoring Suppl (ACCU-CHEK GUIDE) w/Device KIT Use to check blood  sugars as directed   Budeson-Glycopyrrol-Formoterol (BREZTRI AEROSPHERE) 160-9-4.8 MCG/ACT AERO Inhale 2 puffs into the lungs in the morning and at bedtime.   celecoxib (CELEBREX) 200 MG capsule Take 1 capsule (200 mg total) by mouth 2 (two) times daily as needed. Home med.   cetirizine (ZYRTEC) 10 MG tablet TAKE 1 TABLET BY MOUTH AT BEDTIME   ELIQUIS 5 MG TABS tablet TAKE 1 TABLET BY MOUTH TWICE A DAY (Patient taking differently: Take 5 mg by mouth 2 (two) times daily.)   FARXIGA 10 MG TABS tablet TAKE 1 TABLET BY MOUTH DAILY (Patient taking differently: Take 10 mg by mouth daily. TAKE 1 TABLET BY MOUTH DAILY)   glucose blood (ACCU-CHEK GUIDE) test strip Use as instructed to check sugar daily for type 2 diabetes.   isosorbide mononitrate (IMDUR) 60 MG 24 hr tablet Take 1-2 tablets (60-120 mg total) by mouth daily. Home med.   linaclotide (LINZESS) 72 MCG capsule Take 72 mcg by mouth daily before breakfast.   lisinopril (ZESTRIL) 10 MG tablet TAKE 1 TABLET BY MOUTH DAILY (Patient taking differently: Take 10 mg by mouth daily.)   meclizine (ANTIVERT) 25 MG tablet TAKE ONE TABLET BY THREE TIMES A DAY AS NEEDED FOR DIZZINESS (Patient taking differently: Take 25 mg by mouth 3 (three) times daily.)   metFORMIN (GLUCOPHAGE-XR) 500 MG 24 hr tablet TAKE 1 TABLET BY MOUTH TWICE A DAY (Patient taking differently: Take 500 mg by mouth 2 (two) times daily with a meal.)   methocarbamol (ROBAXIN) 500 MG tablet Take 500 mg by mouth every 8 (eight) hours as needed for muscle  spasms. Take 1 tablet by mouth every hour as needed for muscle spasms   naloxone (NARCAN) nasal spray 4 mg/0.1 mL 1 spray into nostril x1 and may repeat every 2-3 minutes until patient is responsive or EMS arrives   nitroGLYCERIN (NITROSTAT) 0.4 MG SL tablet Place 1 tablet (0.4 mg total) under the tongue every 5 (five) minutes as needed for chest pain.   omega-3 acid ethyl esters (LOVAZA) 1 g capsule Take 4 capsules (4 g total) by mouth daily.    omeprazole (PRILOSEC) 40 MG capsule TAKE 1 CAPSULE BY MOUTH 2 TIMES DAILY 30 MINUTES BEFORE BREAKFAST AND DINNER   oxyCODONE-acetaminophen (PERCOCET) 10-325 MG tablet Take 1 tablet by mouth every 4 (four) hours as needed. for pain   torsemide (DEMADEX) 100 MG tablet Take 0.5 tablets (50 mg total) by mouth daily.   traZODone (DESYREL) 150 MG tablet TAKE 1 TABLET BY MOUTH AT BEDTIME   venlafaxine XR (EFFEXOR-XR) 75 MG 24 hr capsule TAKE 1 CAPSULE BY MOUTH DAILY WITH BREAKFAST   No facility-administered medications prior to visit.    Review of Systems  {Labs  Heme  Chem  Endocrine  Serology  Results Review (optional):23779}   Objective    There were no vitals taken for this visit. {Show previous vital signs (optional):23777}  Physical Exam  ***  No results found for any visits on 02/15/23.  Assessment & Plan     ***  No follow-ups on file.      {provider attestation***:1}   [name], MD  [place] [phone] (phone) [phone] (fax)  [place]

## 2023-02-16 ENCOUNTER — Other Ambulatory Visit: Payer: Self-pay

## 2023-02-16 ENCOUNTER — Emergency Department: Payer: Medicare HMO

## 2023-02-16 DIAGNOSIS — E119 Type 2 diabetes mellitus without complications: Secondary | ICD-10-CM | POA: Diagnosis not present

## 2023-02-16 DIAGNOSIS — R0789 Other chest pain: Secondary | ICD-10-CM | POA: Diagnosis not present

## 2023-02-16 DIAGNOSIS — D631 Anemia in chronic kidney disease: Secondary | ICD-10-CM | POA: Diagnosis not present

## 2023-02-16 DIAGNOSIS — R079 Chest pain, unspecified: Secondary | ICD-10-CM | POA: Diagnosis not present

## 2023-02-16 DIAGNOSIS — I480 Paroxysmal atrial fibrillation: Secondary | ICD-10-CM | POA: Diagnosis not present

## 2023-02-16 DIAGNOSIS — M79605 Pain in left leg: Secondary | ICD-10-CM | POA: Insufficient documentation

## 2023-02-16 DIAGNOSIS — E669 Obesity, unspecified: Secondary | ICD-10-CM | POA: Diagnosis not present

## 2023-02-16 DIAGNOSIS — M79604 Pain in right leg: Secondary | ICD-10-CM | POA: Insufficient documentation

## 2023-02-16 DIAGNOSIS — Z955 Presence of coronary angioplasty implant and graft: Secondary | ICD-10-CM | POA: Diagnosis not present

## 2023-02-16 DIAGNOSIS — I700 Atherosclerosis of aorta: Secondary | ICD-10-CM | POA: Diagnosis not present

## 2023-02-16 DIAGNOSIS — Z7984 Long term (current) use of oral hypoglycemic drugs: Secondary | ICD-10-CM | POA: Insufficient documentation

## 2023-02-16 DIAGNOSIS — Z87891 Personal history of nicotine dependence: Secondary | ICD-10-CM | POA: Insufficient documentation

## 2023-02-16 DIAGNOSIS — Z7901 Long term (current) use of anticoagulants: Secondary | ICD-10-CM | POA: Diagnosis not present

## 2023-02-16 DIAGNOSIS — J9811 Atelectasis: Secondary | ICD-10-CM | POA: Diagnosis not present

## 2023-02-16 DIAGNOSIS — E782 Mixed hyperlipidemia: Secondary | ICD-10-CM | POA: Diagnosis not present

## 2023-02-16 DIAGNOSIS — Z85828 Personal history of other malignant neoplasm of skin: Secondary | ICD-10-CM | POA: Diagnosis not present

## 2023-02-16 DIAGNOSIS — I255 Ischemic cardiomyopathy: Secondary | ICD-10-CM | POA: Diagnosis not present

## 2023-02-16 DIAGNOSIS — Z79899 Other long term (current) drug therapy: Secondary | ICD-10-CM | POA: Diagnosis not present

## 2023-02-16 DIAGNOSIS — Z7982 Long term (current) use of aspirin: Secondary | ICD-10-CM | POA: Diagnosis not present

## 2023-02-16 DIAGNOSIS — G8929 Other chronic pain: Secondary | ICD-10-CM | POA: Diagnosis not present

## 2023-02-16 DIAGNOSIS — I110 Hypertensive heart disease with heart failure: Secondary | ICD-10-CM | POA: Diagnosis not present

## 2023-02-16 DIAGNOSIS — I25110 Atherosclerotic heart disease of native coronary artery with unstable angina pectoris: Secondary | ICD-10-CM | POA: Diagnosis not present

## 2023-02-16 DIAGNOSIS — J45909 Unspecified asthma, uncomplicated: Secondary | ICD-10-CM | POA: Insufficient documentation

## 2023-02-16 DIAGNOSIS — I2510 Atherosclerotic heart disease of native coronary artery without angina pectoris: Secondary | ICD-10-CM | POA: Diagnosis not present

## 2023-02-16 DIAGNOSIS — J4489 Other specified chronic obstructive pulmonary disease: Secondary | ICD-10-CM | POA: Diagnosis not present

## 2023-02-16 DIAGNOSIS — I482 Chronic atrial fibrillation, unspecified: Secondary | ICD-10-CM | POA: Diagnosis not present

## 2023-02-16 DIAGNOSIS — I5032 Chronic diastolic (congestive) heart failure: Secondary | ICD-10-CM | POA: Diagnosis not present

## 2023-02-16 DIAGNOSIS — Z951 Presence of aortocoronary bypass graft: Secondary | ICD-10-CM | POA: Diagnosis not present

## 2023-02-16 DIAGNOSIS — N182 Chronic kidney disease, stage 2 (mild): Secondary | ICD-10-CM | POA: Diagnosis not present

## 2023-02-16 DIAGNOSIS — E1122 Type 2 diabetes mellitus with diabetic chronic kidney disease: Secondary | ICD-10-CM | POA: Diagnosis not present

## 2023-02-16 DIAGNOSIS — I10 Essential (primary) hypertension: Secondary | ICD-10-CM | POA: Diagnosis not present

## 2023-02-16 DIAGNOSIS — J449 Chronic obstructive pulmonary disease, unspecified: Secondary | ICD-10-CM | POA: Diagnosis not present

## 2023-02-16 DIAGNOSIS — I130 Hypertensive heart and chronic kidney disease with heart failure and stage 1 through stage 4 chronic kidney disease, or unspecified chronic kidney disease: Secondary | ICD-10-CM | POA: Diagnosis not present

## 2023-02-16 DIAGNOSIS — I5023 Acute on chronic systolic (congestive) heart failure: Secondary | ICD-10-CM | POA: Diagnosis not present

## 2023-02-16 NOTE — ED Triage Notes (Signed)
Pt BIB [facility] for bilateral leg pain. States had leg cramps on Sunday and was seen at that time. States pain has continued since then. Reports was seen at the cardiologist today and Marcelline Deist wanted him to come back in for re-evaluation of leg pain. Denies walking with walker or cane. Denies fall or injury/trauma. Pt alert and oriented following commands. Breathing unlabored speaking in full sentences with symmetric chest rise and fall.

## 2023-02-16 NOTE — ED Triage Notes (Signed)
Pt now reporting "lung pain where that nodule is" and that Marcelline Deist reported concern for that as well and wanted repeat evaluation. Denies chest pain or SOB. Again, pt breathing unlabored speaking in full sentences with symmetric chest rise and fall. No cough or congestion. No wheeze noted.

## 2023-02-17 ENCOUNTER — Emergency Department
Admission: EM | Admit: 2023-02-17 | Discharge: 2023-02-17 | Disposition: A | Discharge status: Home / Self Care | Payer: Medicare HMO | Attending: Emergency Medicine | Admitting: Emergency Medicine

## 2023-02-17 DIAGNOSIS — R079 Chest pain, unspecified: Secondary | ICD-10-CM

## 2023-02-17 DIAGNOSIS — G8929 Other chronic pain: Secondary | ICD-10-CM

## 2023-02-17 DIAGNOSIS — I10 Essential (primary) hypertension: Secondary | ICD-10-CM

## 2023-02-17 LAB — CBC WITH DIFFERENTIAL/PLATELET
Abs Immature Granulocytes: 0.02 10*3/uL (ref 0.00–0.07)
Basophils Absolute: 0.1 10*3/uL (ref 0.0–0.1)
Basophils Relative: 1 %
Eosinophils Absolute: 0.1 10*3/uL (ref 0.0–0.5)
Eosinophils Relative: 2 %
HCT: 39 % (ref 39.0–52.0)
Hemoglobin: 12.3 g/dL — ABNORMAL LOW (ref 13.0–17.0)
Immature Granulocytes: 0 %
Lymphocytes Relative: 20 %
Lymphs Abs: 1.5 10*3/uL (ref 0.7–4.0)
MCH: 28.5 pg (ref 26.0–34.0)
MCHC: 31.5 g/dL (ref 30.0–36.0)
MCV: 90.3 fL (ref 80.0–100.0)
Monocytes Absolute: 0.5 10*3/uL (ref 0.1–1.0)
Monocytes Relative: 6 %
Neutro Abs: 5.1 10*3/uL (ref 1.7–7.7)
Neutrophils Relative %: 71 %
Platelets: 191 10*3/uL (ref 150–400)
RBC: 4.32 MIL/uL (ref 4.22–5.81)
RDW: 15.4 % (ref 11.5–15.5)
WBC: 7.2 10*3/uL (ref 4.0–10.5)
nRBC: 0 % (ref 0.0–0.2)

## 2023-02-17 LAB — URINALYSIS, ROUTINE W REFLEX MICROSCOPIC
Bacteria, UA: NONE SEEN
Bilirubin Urine: NEGATIVE
Glucose, UA: >=500 mg/dL — AB
Hgb urine dipstick: NEGATIVE
Ketones, ur: NEGATIVE mg/dL
Leukocytes,Ua: NEGATIVE
Nitrite: NEGATIVE
Protein, ur: NEGATIVE mg/dL
Specific Gravity, Urine: 1.025 (ref 1.005–1.030)
Squamous Epithelial / HPF: NONE SEEN /HPF (ref 0–5)
pH: 6 (ref 5.0–8.0)

## 2023-02-17 LAB — BASIC METABOLIC PANEL
Anion gap: 9 (ref 5–15)
BUN: 15 mg/dL (ref 8–23)
CO2: 28 mmol/L (ref 22–32)
Calcium: 9 mg/dL (ref 8.9–10.3)
Chloride: 104 mmol/L (ref 98–111)
Creatinine, Ser: 0.87 mg/dL (ref 0.61–1.24)
GFR, Estimated: >60 mL/min (ref >60)
Glucose, Bld: 103 mg/dL — ABNORMAL HIGH (ref 70–99)
Potassium: 3.7 mmol/L (ref 3.5–5.1)
Sodium: 141 mmol/L (ref 135–145)

## 2023-02-17 LAB — TROPONIN I (HIGH SENSITIVITY)
Troponin I (High Sensitivity): 13 ng/L (ref <18)
Troponin I (High Sensitivity): 14 ng/L (ref <18)

## 2023-02-17 LAB — D-DIMER, QUANTITATIVE: D-Dimer, Quant: <0.27 ug/mL-FEU (ref 0.00–0.50)

## 2023-02-17 LAB — MAGNESIUM: Magnesium: 1.8 mg/dL (ref 1.7–2.4)

## 2023-02-17 MED ORDER — MORPHINE SULFATE (PF) 4 MG/ML IV SOLN
4.0000 mg | Freq: Once | INTRAVENOUS | Status: AC
  Administered 2023-02-17: 4 mg via INTRAVENOUS
  Filled 2023-02-17: qty 1

## 2023-02-17 MED ORDER — OXYCODONE-ACETAMINOPHEN 5-325 MG PO TABS: 1.0000 | ORAL_TABLET | Freq: Once | ORAL | Status: DC

## 2023-02-17 NOTE — ED Notes (Signed)
Patient resting in bed free from sign of distress. Breathing unlabored speaking in full sentences with symmetric chest rise and fall. Bed low and locked with side rails raised x2. Call bell in reach and monitor in place.   

## 2023-02-17 NOTE — Discharge Instructions (Addendum)
Your lab work today showed no significant abnormality.  Your cardiac enzymes were normal.  Your chest x-ray showed no acute changes.  Your D-dimer was negative which rules out blood clots.  Your electrolytes were normal.  Your urine showed no blood or sign of infection.  You do not have any indication for emergent admission to the hospital at this time and I recommend continued follow-up with your PCP as an outpatient, vascular surgery as an outpatient and I feel you may need to be set up with pain management if you do not feel that the oxycodone that you are prescribed regularly is controlling her pain.

## 2023-02-17 NOTE — ED Provider Notes (Signed)
[facility] Provider Note    Event Date/Time   First MD Initiated Contact with Patient 02/17/23 0005     (approximate)   History   Leg Pain and see triage note   HPI  Lawrence [name] is a 67 y.o. male history of A-fib, CAD, CHF, COPD, hypertension, diabetes, chronic pain getting 180 tablets of 10 mg Percocet monthly by Dr. [name] who presents to the emergency department with complaints of chronic leg pain.  He has been seen many times in the past for this and is requesting admission to the hospital today.  He denies any new changes.  He states he was seen by his cardiologist today who ordered ABIs as an outpatient.  Patient has not yet seen vascular surgery for this.  He states he is no longer smoking.  He denies any injury or swelling to his legs.  He is able to ambulate.  He states he was told by his cardiologist if the pain got worse to come to the ED.  He also complaining of right-sided chest pain and states he thinks he has a "kidney infection".  No dysuria or hematuria.  No history of pyelonephritis or kidney stones.  No history of PE.   History provided by patient.    Past Medical History:  Diagnosis Date   A-fib    Anemia    Anginal pain    Anxiety    Arthritis    Asthma    CAD (coronary artery disease)    a. 2002 CABGx2 (LIMA->LAD, VG->VG->OM1);  b. 09/2012 DES->OM;  c. 03/2015 PTCA of LAD [facility]) in setting of atretic LIMA; d. 05/2015 Cath [facility]): nonobs dzs; e. 06/2015 Cath ([facility]): LM nl, LAD 45p/d ISR, 50d, D1/2 small, LCX 50p/d ISR, OM1 70ost, 30 ISR, VG->OM1 50ost, 68m, LIMA->LAD 99p/d - atretic, RCA dom, nl; f.cath 10/16: 40-50%(FFR 0.90) pLAD, 75% (FFR 0.77) mLAD s/p PCI/DES, oRCA 40% (FFR0.95)   Cancer    SKIN CANCER ON BACK   Celiac disease    Chronic diastolic CHF (congestive heart failure)    a. 06/2009 Echo: EF 60-65%, Gr 1 DD, triv AI, mildly dil LA, nl RV.   COPD (chronic obstructive pulmonary disease)    a. Chronic bronchitis and  emphysema.   DDD (degenerative disc disease), lumbar    Diverticulosis    Dysrhythmia    Essential hypertension    GERD (gastroesophageal reflux disease)    History of hiatal hernia    History of kidney stones    H/O   History of tobacco abuse    a. Quit 2014.   Myocardial infarction 2002   4 STENTS   Pancreatitis    PSVT (paroxysmal supraventricular tachycardia)    a. 10/2012 Noted on Zio Patch.   Sleep apnea    LOST CORD TO CPAP -ONLY 02 @ BEDTIME   Tubular adenoma of colon    Type II diabetes mellitus     Past Surgical History:  Procedure Laterality Date   BYPASS GRAFT     CARDIAC CATHETERIZATION N/A 07/12/2015   rocedure: Left Heart Cath and Cors/Grafts Angiography;  Surgeon: Belva Crome, MD;  Location: [place] [facility];  Service: Cardiovascular;  Laterality: N/A;   CARDIAC CATHETERIZATION Right 10/07/2015   Procedure: Left Heart Cath and Cors/Grafts Angiography;  Surgeon: Dionisio David, MD;  Location: [place] [facility];  Service: Cardiovascular;  Laterality: Right;   CARDIAC CATHETERIZATION N/A 04/06/2016   Procedure: Left Heart Cath and Coronary Angiography;  Surgeon: Yolonda Kida, MD;  Location: [place] [facility];  Service: Cardiovascular;  Laterality: N/A;   CARDIAC CATHETERIZATION  04/06/2016   Procedure: Bypass Graft Angiography;  Surgeon: Yolonda Kida, MD;  Location: [place] [facility];  Service: Cardiovascular;;   CARDIAC CATHETERIZATION N/A 11/02/2016   Procedure: Left Heart Cath and Cors/Grafts Angiography and possible PCI;  Surgeon: Yolonda Kida, MD;  Location: [place] [facility];  Service: Cardiovascular;  Laterality: N/A;   CARDIAC CATHETERIZATION N/A 11/02/2016   Procedure: Coronary Stent Intervention;  Surgeon: Yolonda Kida, MD;  Location: [place] [facility];  Service: Cardiovascular;  Laterality: N/A;   CARDIOVERSION N/A 06/29/2022   Procedure: CARDIOVERSION;  Surgeon: Corey Skains, MD;  Location: [facility];  Service:  Cardiovascular;  Laterality: N/A;   CHOLECYSTECTOMY     CIRCUMCISION N/A 06/09/2019   Procedure: CIRCUMCISION ADULT;  Surgeon: Billey Co, MD;  Location: [facility];  Service: Urology;  Laterality: N/A;   COLONOSCOPY WITH PROPOFOL N/A 04/01/2018   Procedure: COLONOSCOPY WITH PROPOFOL;  Surgeon: Manya Silvas, MD;  Location: [facility];  Service: Endoscopy;  Laterality: N/A;   ESOPHAGEAL DILATION     ESOPHAGOGASTRODUODENOSCOPY (EGD) WITH PROPOFOL N/A 04/01/2018   Procedure: ESOPHAGOGASTRODUODENOSCOPY (EGD) WITH PROPOFOL;  Surgeon: Manya Silvas, MD;  Location: [facility] ENDOSCOPY;  Service: Endoscopy;  Laterality: N/A;   LEFT HEART CATH AND CORS/GRAFTS ANGIOGRAPHY N/A 06/12/2019   Procedure: LEFT HEART CATH AND CORS/GRAFTS ANGIOGRAPHY;  Surgeon: Teodoro Spray, MD;  Location: [place] [facility];  Service: Cardiovascular;  Laterality: N/A;   LEFT HEART CATH AND CORS/GRAFTS ANGIOGRAPHY N/A 03/11/2020   Procedure: LEFT HEART CATH AND CORS/GRAFTS ANGIOGRAPHY;  Surgeon: Isaias Cowman, MD;  Location: [place] [facility];  Service: Cardiovascular;  Laterality: N/A;   LEFT HEART CATH AND CORS/GRAFTS ANGIOGRAPHY N/A 05/01/2021   Procedure: LEFT HEART CATH AND CORS/GRAFTS ANGIOGRAPHY;  Surgeon: Corey Skains, MD;  Location: [place] [facility];  Service: Cardiovascular;  Laterality: N/A;   LEFT HEART CATH AND CORS/GRAFTS ANGIOGRAPHY N/A 11/02/2022   Procedure: LEFT HEART CATH AND CORS/GRAFTS ANGIOGRAPHY;  Surgeon: Yolonda Kida, MD;  Location: [place] [facility];  Service: Cardiovascular;  Laterality: N/A;   TONSILLECTOMY     VASCULAR SURGERY      MEDICATIONS:  Prior to Admission medications   Medication Sig Start Date End Date Taking? Authorizing Provider  Accu-Chek Softclix Lancets lancets Use as instructed to check sugar daily for type 2 diabetes. 03/03/21   Birdie Sons, MD  acetaminophen (TYLENOL) 325 MG tablet Take 2 tablets (650 mg total) by mouth every 4 (four)  hours as needed for headache or mild pain. 08/07/22   Lavina Hamman, MD  albuterol (VENTOLIN HFA) 108 (90 Base) MCG/ACT inhaler INHALE 2 PUFFS BY MOUTH EVERY 6 HOURS AS NEEDED FOR SHORTNESS OF BREATH Patient not taking: Reported on 02/15/2023 12/02/21   Birdie Sons, MD  allopurinol (ZYLOPRIM) 300 MG tablet TAKE 1 TABLET BY MOUTH TWICE A DAY Patient taking differently: Take 300 mg by mouth 2 (two) times daily. 10/15/21   Birdie Sons, MD  ALPRAZolam Duanne Moron) 1 MG tablet Take 1 tablet (1 mg total) by mouth 3 (three) times daily. 02/01/23   Birdie Sons, MD  amiodarone (PACERONE) 200 MG tablet Take 200 mg by mouth 2 (two) times daily.    [provider]  aspirin 81 MG chewable tablet Chew 81 mg by mouth daily. Swallows whole    [provider]  atorvastatin (LIPITOR) 80 MG tablet Take 1 tablet (80 mg total) by mouth daily. Please schedule office visit before any future refill. 11/19/22   Birdie Sons, MD  BELSOMRA 5 MG TABS TAKE 1 TABLET (5 MG TOTAL) BY MOUTH AT BEDTIME AS NEEDED Patient taking differently: Take 5 mg by mouth at bedtime as needed. 06/17/22   Birdie Sons, MD  Blood Glucose Calibration (ACCU-CHEK GUIDE CONTROL) LIQD Use with blood glucose monitor as directed 02/20/21   Birdie Sons, MD  Blood Glucose Monitoring Suppl (ACCU-CHEK GUIDE) w/Device KIT Use to check blood sugars as directed 02/20/21   Birdie Sons, MD  Budeson-Glycopyrrol-Formoterol (BREZTRI AEROSPHERE) 160-9-4.8 MCG/ACT AERO Inhale 2 puffs into the lungs in the morning and at bedtime. 02/02/23   Tyler Pita, MD  celecoxib (CELEBREX) 200 MG capsule Take 1 capsule (200 mg total) by mouth 2 (two) times daily as needed. Home med. 11/03/22   Enzo Bi, MD  cetirizine (ZYRTEC) 10 MG tablet TAKE 1 TABLET BY MOUTH AT BEDTIME 04/16/22   Birdie Sons, MD  ELIQUIS 5 MG TABS tablet TAKE 1 TABLET BY MOUTH TWICE A DAY Patient taking differently: Take 5 mg by mouth 2 (two) times daily. 07/30/22    Birdie Sons, MD  FARXIGA 10 MG TABS tablet TAKE 1 TABLET BY MOUTH DAILY Patient taking differently: Take 10 mg by mouth daily. TAKE 1 TABLET BY MOUTH DAILY 02/09/22   Birdie Sons, MD  glucose blood (ACCU-CHEK GUIDE) test strip Use as instructed to check sugar daily for type 2 diabetes. 03/03/21   Birdie Sons, MD  isosorbide mononitrate (IMDUR) 60 MG 24 hr tablet Take 1-2 tablets (60-120 mg total) by mouth daily. Home med. 11/03/22   Enzo Bi, MD  linaclotide [facility]) 72 MCG capsule Take 72 mcg by mouth daily before breakfast.    [provider]  lisinopril (ZESTRIL) 10 MG tablet TAKE 1 TABLET BY MOUTH DAILY Patient taking differently: Take 10 mg by mouth daily. 07/30/22   Birdie Sons, MD  meclizine (ANTIVERT) 25 MG tablet TAKE ONE TABLET BY THREE TIMES A DAY AS NEEDED FOR DIZZINESS Patient taking differently: Take 25 mg by mouth 3 (three) times daily. 08/13/22   Birdie Sons, MD  metFORMIN (GLUCOPHAGE-XR) 500 MG 24 hr tablet TAKE 1 TABLET BY MOUTH TWICE A DAY Patient taking differently: Take 500 mg by mouth 2 (two) times daily with a meal. 10/05/22   [name], [name], MD  methocarbamol (ROBAXIN) 500 MG tablet Take 500 mg by mouth every 8 (eight) hours as needed for muscle spasms. Take 1 tablet by mouth every hour as needed for muscle spasms    Birdie Sons, MD  metroNIDAZOLE (METROGEL) 1 % gel Apply topically daily. 02/15/23   Birdie Sons, MD  naloxone [facility]) nasal spray 4 mg/0.1 mL 1 spray into nostril x1 and may repeat every 2-3 minutes until patient is responsive or EMS arrives 08/21/20   Birdie Sons, MD  nitroGLYCERIN (NITROSTAT) 0.4 MG SL tablet Place 1 tablet (0.4 mg total) under the tongue every 5 (five) minutes as needed for chest pain. 09/10/22   Mikey Kirschner, PA-C  omega-3 acid ethyl esters (LOVAZA) 1 g capsule Take 4 capsules (4 g total) by mouth daily. 09/10/22   Mikey Kirschner, PA-C  omeprazole (PRILOSEC) 40 MG capsule TAKE 1 CAPSULE BY  MOUTH 2 TIMES DAILY 30 MINUTES BEFORE BREAKFAST AND DINNER 07/15/22   Birdie Sons, MD  oxyCODONE-acetaminophen (PERCOCET) 10-325 MG tablet Take 1 tablet by mouth every 4 (four) hours as needed. for pain 02/01/23   Birdie Sons, MD  predniSONE (DELTASONE) 10 MG tablet 6 tablets for 1 day, then 5 for 1 day, then 4 for 1 day, then 3 for 1 day, then 2 for 1 day then 1 for 1 day. 02/15/23 02/21/23  Birdie Sons, MD  Suvorexant (BELSOMRA) 10 MG TABS Take 1 tablet (10 mg total) by mouth at bedtime as needed. 02/15/23   Birdie Sons, MD  torsemide (DEMADEX) 100 MG tablet Take 0.5 tablets (50 mg total) by mouth daily. 09/10/22   Mikey Kirschner, PA-C  traZODone (DESYREL) 150 MG tablet TAKE 1 TABLET BY MOUTH AT BEDTIME 11/24/22   Birdie Sons, MD  venlafaxine XR (EFFEXOR-XR) 75 MG 24 hr capsule TAKE 1 CAPSULE BY MOUTH DAILY WITH BREAKFAST 12/16/21   Birdie Sons, MD    Physical Exam   Triage Vital Signs: ED Triage Vitals  Enc Vitals Group     BP 02/16/23 2123 (!) 173/128     Pulse Rate 02/16/23 2123 90     Resp 02/16/23 2123 18     Temp 02/16/23 2123 98.6 F (37 C)     Temp Source 02/16/23 2123 Oral     SpO2 02/16/23 2123 95 %     Weight 02/16/23 2124 193 lb (87.5 kg)     Height 02/16/23 2124 5\' 7"  (1.702 m)     Head Circumference --      Peak Flow --      Pain Score 02/16/23 2126 9     Pain Loc --      Pain Edu? --      Excl. in [place]? --     Most recent vital signs: Vitals:   02/17/23 0233 02/17/23 0330  BP:  (!) 165/108  Pulse:  80  Resp:  15  Temp: 98.4 F (36.9 C)   SpO2:  97%    CONSTITUTIONAL: Alert, responds appropriately to questions.  Niccoli ill-appearing, appears older than stated age HEAD: Normocephalic, atraumatic EYES: Conjunctivae clear, pupils appear equal, sclera nonicteric ENT: normal nose; moist mucous membranes NECK: Supple, normal ROM CARD: RRR; S1 and S2 appreciated RESP: Normal chest excursion without splinting or tachypnea; breath sounds clear  and equal bilaterally; no wheezes, no rhonchi, no rales, no hypoxia or respiratory distress, speaking full sentences ABD/GI: Non-distended; soft, non-tender, no rebound, no guarding, no peritoneal signs BACK: The back appears normal, no CVA tenderness EXT: Normal ROM in all joints; no deformity noted, no edema, no calf tenderness or calf swelling, extremities are warm and well-perfused, compartments are soft, he has easily palpable and dopplerable 2+ DP and PT pulses bilaterally SKIN: Normal color for age and race; warm; no rash on exposed skin NEURO: Moves all extremities equally, normal speech PSYCH: The patient's mood and manner are appropriate.   ED Results / Procedures / Treatments   LABS: (all labs ordered are listed, but only abnormal results are displayed) Labs Reviewed  CBC WITH DIFFERENTIAL/PLATELET - Abnormal; Notable for the following components:      Result Value   Hemoglobin 12.3 (*)    All other components within normal limits  BASIC METABOLIC PANEL - Abnormal; Notable for the following components:   Glucose, Bld 103 (*)    All other components within normal limits  URINALYSIS, ROUTINE W REFLEX MICROSCOPIC - Abnormal; Notable for the following components:   Color, Urine YELLOW (*)  APPearance CLEAR (*)    Glucose, UA >=500 (*)    All other components within normal limits  MAGNESIUM  D-DIMER, QUANTITATIVE  TROPONIN I (HIGH SENSITIVITY)  TROPONIN I (HIGH SENSITIVITY)     EKG:  EKG Interpretation  Date/Time:  Tuesday February 16 2023 21:32:32 EDT Ventricular Rate:  85 PR Interval:    QRS Duration: 90 QT Interval:  380 QTC Calculation: 452 R Axis:   48 Text Interpretation: Atrial fibrillation Possible Inferior infarct , age undetermined Anterior infarct , age undetermined Abnormal ECG When compared with ECG of 09-Feb-2023 20:39, PREVIOUS ECG IS PRESENT No significant change since last tracing Confirmed by Pryor Curia [id number]) on 02/17/2023 1:00:06 AM          RADIOLOGY: My personal review and interpretation of imaging: Chest x-ray shows no acute abnormality.  I have personally reviewed all radiology reports.   DG Chest Portable 1 View  Result Date: 02/16/2023 CLINICAL DATA:  Bilateral leg pain. EXAM: PORTABLE CHEST 1 VIEW COMPARISON:  February 09, 2023 FINDINGS: Multiple sternal wires and vascular clips are noted. The heart size and mediastinal contours are within normal limits. A coronary artery stent is seen. There is marked severity calcification of the aortic arch. Chronic appearing increased lung markings are seen with mild atelectasis noted within the bilateral lung bases. Multilevel degenerative changes are seen throughout the thoracic spine. IMPRESSION: 1. Evidence of prior median sternotomy/CABG. 2. Mild bibasilar atelectasis. Electronically Signed   By: Virgina Norfolk M.D.   On: 02/16/2023 21:45     PROCEDURES:  Critical Care performed: No     .1-3 Lead EKG Interpretation  Performed by: Amabel Stmarie, Delice Bison, DO Authorized by: Dijuan Sleeth, Delice Bison, DO     Interpretation: normal     ECG rate:  80   ECG rate assessment: normal     Rhythm: sinus rhythm     Ectopy: none     Conduction: normal       IMPRESSION / MDM / ASSESSMENT AND PLAN / ED COURSE  I reviewed the triage vital signs and the nursing notes.    Patient here with complaints of right-sided chest pain and chronic bilateral lower extremity pain.  The patient is on the cardiac monitor to evaluate for evidence of arrhythmia and/or significant heart rate changes.   DIFFERENTIAL DIAGNOSIS (includes but not limited to):   ACS, PE, dissection, pneumonia, pneumothorax, CHF, claudication, doubt DVT, no sign of compartment syndrome, gout, cellulitis, arterial obstruction   Patient's presentation is most consistent with exacerbation of chronic illness.   PLAN: Will obtain labs, urine, chest x-ray.  Have offered him Percocet here but he declined stating that it does nothing  for him and is asking for morphine which is his frequent presentation to the ER.  Will give one-time dose here.  No signs of arterial obstruction on exam, DVT, compartment syndrome, cellulitis or other acute abnormality causing his leg pain.  We have discussed that this is likely vascular claudication and that he needs to follow-up with a vascular surgeon as an outpatient but there is no indication for further emergent workup for his leg pain.  He has had ultrasounds that ruled out DVT previously.  He has easily palpable pulses and I have even Doppler them for him today so that he can hear them.  He is ambulatory with a cane which is his baseline.  He is also complaining of right-sided chest pain.  Seems atypical for ACS but does have significant cardiac history.  EKG nonischemic.  Will obtain D-dimer, chest x-ray.  He states he thinks it is pain from his kidneys but he has no CVA tenderness.  Will check urinalysis although no urinary symptoms.  Patient has had previous CT dissection studies that have not shown any aneurysm, dissection.  Do not feel this needs to be repeated at this time.  It was last done on 01/27/2023.  MEDICATIONS GIVEN IN ED: Medications  morphine (PF) 4 MG/ML injection 4 mg (4 mg Intravenous Given 02/17/23 0128)     ED COURSE: Patient's troponin x 2 was negative.  D-dimer negative.  Normal electrolytes, hemoglobin.  Chest x-ray reviewed and interpreted by myself and the radiologist and is clear.  Urine shows no sign of infection or blood.  I feel he is safe for discharge.  Explained to patient again that his chronic leg pain needs to be addressed as an outpatient with his PCP and vascular surgeon and that there is not much we would be able to do for this chronic condition from the ER at this time.  Discussed with him that he will need to talk to Dr. [name] if he feels like the Percocet that he is on monthly is not controlling his pain.  He has requested admission to the hospital but we  have discussed that he has no sign of any life-threatening condition present today and I feel he is safe for discharge home.  He does have elevated blood pressure but again this appears chronic for him.   At this time, I do not feel there is any life-threatening condition present. I reviewed all nursing notes, vitals, pertinent previous records.  All lab and urine results, EKGs, imaging ordered have been independently reviewed and interpreted by myself.  I reviewed all available radiology reports from any imaging ordered this visit.  Based on my assessment, I feel the patient is safe to be discharged home without further emergent workup and can continue workup as an outpatient as needed. Discussed all findings, treatment plan as well as usual and customary return precautions.  They verbalize understanding and are comfortable with this plan.  Outpatient follow-up has been provided as needed.  All questions have been answered.    CONSULTS:  none   OUTSIDE RECORDS REVIEWED: Reviewed recent cardiology and PCP notes.       FINAL CLINICAL IMPRESSION(S) / ED DIAGNOSES   Final diagnoses:  Chronic pain of both lower extremities  Right-sided chest pain  Primary hypertension     Rx / DC Orders   ED Discharge Orders     None        Note:  This document was prepared using Dragon voice recognition software and may include unintentional dictation errors.   Kaiulani Sitton, Delice Bison, DO 02/17/23 [id number]

## 2023-02-17 NOTE — ED Notes (Signed)
Patient discharged at this time. Wheeled to lobby per request to await cab.  Breathing unlabored speaking in full sentences. Verbalized understanding of all discharge, follow up, and medication teaching. Discharged homed with all belongings.

## 2023-02-17 NOTE — ED Notes (Signed)
MD aware of pt final bp. States continue with discharge.

## 2023-02-21 ENCOUNTER — Observation Stay
Admission: EM | Admit: 2023-02-21 | Discharge: 2023-02-22 | Disposition: A | Discharge status: Home / Self Care | Payer: Medicare HMO | Attending: Internal Medicine | Admitting: Internal Medicine

## 2023-02-21 ENCOUNTER — Other Ambulatory Visit: Payer: Self-pay

## 2023-02-21 ENCOUNTER — Emergency Department: Payer: Medicare HMO

## 2023-02-21 DIAGNOSIS — Z87891 Personal history of nicotine dependence: Secondary | ICD-10-CM | POA: Diagnosis not present

## 2023-02-21 DIAGNOSIS — I130 Hypertensive heart and chronic kidney disease with heart failure and stage 1 through stage 4 chronic kidney disease, or unspecified chronic kidney disease: Secondary | ICD-10-CM | POA: Diagnosis not present

## 2023-02-21 DIAGNOSIS — E1122 Type 2 diabetes mellitus with diabetic chronic kidney disease: Secondary | ICD-10-CM | POA: Diagnosis not present

## 2023-02-21 DIAGNOSIS — I10 Essential (primary) hypertension: Secondary | ICD-10-CM | POA: Diagnosis not present

## 2023-02-21 DIAGNOSIS — J9811 Atelectasis: Secondary | ICD-10-CM | POA: Diagnosis not present

## 2023-02-21 DIAGNOSIS — I25709 Atherosclerosis of coronary artery bypass graft(s), unspecified, with unspecified angina pectoris: Secondary | ICD-10-CM | POA: Diagnosis not present

## 2023-02-21 DIAGNOSIS — J45909 Unspecified asthma, uncomplicated: Secondary | ICD-10-CM | POA: Diagnosis not present

## 2023-02-21 DIAGNOSIS — Z85828 Personal history of other malignant neoplasm of skin: Secondary | ICD-10-CM | POA: Diagnosis not present

## 2023-02-21 DIAGNOSIS — R079 Chest pain, unspecified: Secondary | ICD-10-CM | POA: Diagnosis not present

## 2023-02-21 DIAGNOSIS — J449 Chronic obstructive pulmonary disease, unspecified: Secondary | ICD-10-CM | POA: Diagnosis not present

## 2023-02-21 DIAGNOSIS — G4489 Other headache syndrome: Secondary | ICD-10-CM | POA: Diagnosis not present

## 2023-02-21 DIAGNOSIS — R0789 Other chest pain: Secondary | ICD-10-CM | POA: Diagnosis not present

## 2023-02-21 DIAGNOSIS — E1121 Type 2 diabetes mellitus with diabetic nephropathy: Secondary | ICD-10-CM

## 2023-02-21 DIAGNOSIS — I2510 Atherosclerotic heart disease of native coronary artery without angina pectoris: Secondary | ICD-10-CM | POA: Insufficient documentation

## 2023-02-21 DIAGNOSIS — E119 Type 2 diabetes mellitus without complications: Secondary | ICD-10-CM

## 2023-02-21 DIAGNOSIS — E785 Hyperlipidemia, unspecified: Secondary | ICD-10-CM | POA: Diagnosis present

## 2023-02-21 DIAGNOSIS — Z7984 Long term (current) use of oral hypoglycemic drugs: Secondary | ICD-10-CM | POA: Diagnosis not present

## 2023-02-21 DIAGNOSIS — I482 Chronic atrial fibrillation, unspecified: Secondary | ICD-10-CM | POA: Diagnosis not present

## 2023-02-21 DIAGNOSIS — Z7901 Long term (current) use of anticoagulants: Secondary | ICD-10-CM | POA: Insufficient documentation

## 2023-02-21 DIAGNOSIS — I5032 Chronic diastolic (congestive) heart failure: Secondary | ICD-10-CM | POA: Insufficient documentation

## 2023-02-21 DIAGNOSIS — I5030 Unspecified diastolic (congestive) heart failure: Secondary | ICD-10-CM | POA: Diagnosis not present

## 2023-02-21 DIAGNOSIS — I480 Paroxysmal atrial fibrillation: Secondary | ICD-10-CM | POA: Insufficient documentation

## 2023-02-21 DIAGNOSIS — I160 Hypertensive urgency: Secondary | ICD-10-CM | POA: Diagnosis present

## 2023-02-21 DIAGNOSIS — M109 Gout, unspecified: Secondary | ICD-10-CM | POA: Insufficient documentation

## 2023-02-21 DIAGNOSIS — Z79899 Other long term (current) drug therapy: Secondary | ICD-10-CM | POA: Diagnosis not present

## 2023-02-21 DIAGNOSIS — I4891 Unspecified atrial fibrillation: Secondary | ICD-10-CM | POA: Diagnosis not present

## 2023-02-21 DIAGNOSIS — Z951 Presence of aortocoronary bypass graft: Secondary | ICD-10-CM | POA: Diagnosis not present

## 2023-02-21 DIAGNOSIS — Z7982 Long term (current) use of aspirin: Secondary | ICD-10-CM | POA: Insufficient documentation

## 2023-02-21 DIAGNOSIS — N182 Chronic kidney disease, stage 2 (mild): Secondary | ICD-10-CM | POA: Diagnosis not present

## 2023-02-21 LAB — COMPREHENSIVE METABOLIC PANEL
ALT: 12 U/L (ref 0–44)
AST: 14 U/L — ABNORMAL LOW (ref 15–41)
Albumin: 3.5 g/dL (ref 3.5–5.0)
Alkaline Phosphatase: 67 U/L (ref 38–126)
Anion gap: 8 (ref 5–15)
BUN: 19 mg/dL (ref 8–23)
CO2: 26 mmol/L (ref 22–32)
Calcium: 8.7 mg/dL — ABNORMAL LOW (ref 8.9–10.3)
Chloride: 104 mmol/L (ref 98–111)
Creatinine, Ser: 0.92 mg/dL (ref 0.61–1.24)
GFR, Estimated: >60 mL/min (ref >60)
Glucose, Bld: 136 mg/dL — ABNORMAL HIGH (ref 70–99)
Potassium: 3.3 mmol/L — ABNORMAL LOW (ref 3.5–5.1)
Sodium: 138 mmol/L (ref 135–145)
Total Bilirubin: 0.9 mg/dL (ref 0.3–1.2)
Total Protein: 6.5 g/dL (ref 6.5–8.1)

## 2023-02-21 LAB — CBC WITH DIFFERENTIAL/PLATELET
Abs Immature Granulocytes: 0.07 10*3/uL (ref 0.00–0.07)
Basophils Absolute: 0 10*3/uL (ref 0.0–0.1)
Basophils Relative: 0 %
Eosinophils Absolute: 0 10*3/uL (ref 0.0–0.5)
Eosinophils Relative: 0 %
HCT: 41.1 % (ref 39.0–52.0)
Hemoglobin: 12.9 g/dL — ABNORMAL LOW (ref 13.0–17.0)
Immature Granulocytes: 1 %
Lymphocytes Relative: 14 %
Lymphs Abs: 1.4 10*3/uL (ref 0.7–4.0)
MCH: 27.9 pg (ref 26.0–34.0)
MCHC: 31.4 g/dL (ref 30.0–36.0)
MCV: 88.8 fL (ref 80.0–100.0)
Monocytes Absolute: 0.6 10*3/uL (ref 0.1–1.0)
Monocytes Relative: 6 %
Neutro Abs: 8.1 10*3/uL — ABNORMAL HIGH (ref 1.7–7.7)
Neutrophils Relative %: 79 %
Platelets: 241 10*3/uL (ref 150–400)
RBC: 4.63 MIL/uL (ref 4.22–5.81)
RDW: 15.2 % (ref 11.5–15.5)
WBC: 10.2 10*3/uL (ref 4.0–10.5)
nRBC: 0 % (ref 0.0–0.2)

## 2023-02-21 LAB — TROPONIN I (HIGH SENSITIVITY): Troponin I (High Sensitivity): 23 ng/L — ABNORMAL HIGH (ref <18)

## 2023-02-21 MED ORDER — HYDROMORPHONE HCL 1 MG/ML IJ SOLN
1.0000 mg | Freq: Once | INTRAMUSCULAR | Status: AC
  Administered 2023-02-21: 1 mg via INTRAVENOUS
  Filled 2023-02-21: qty 1
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
## 2023-02-21 NOTE — ED Triage Notes (Signed)
Pt to room 13 via ems w c/o chest pain starting at 10pm tonight. Pt gave himself 3 nitro tabs 5 mins apart with no relief. Ems gave 324 asa and 1.5 inch nitro paste.
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
## 2023-02-21 NOTE — ED Notes (Signed)
Transported to xray 

## 2023-02-22 ENCOUNTER — Other Ambulatory Visit: Payer: Self-pay

## 2023-02-22 DIAGNOSIS — I480 Paroxysmal atrial fibrillation: Secondary | ICD-10-CM

## 2023-02-22 DIAGNOSIS — I160 Hypertensive urgency: Secondary | ICD-10-CM

## 2023-02-22 DIAGNOSIS — E785 Hyperlipidemia, unspecified: Secondary | ICD-10-CM

## 2023-02-22 DIAGNOSIS — E119 Type 2 diabetes mellitus without complications: Secondary | ICD-10-CM

## 2023-02-22 DIAGNOSIS — R079 Chest pain, unspecified: Secondary | ICD-10-CM

## 2023-02-22 DIAGNOSIS — R0782 Intercostal pain: Secondary | ICD-10-CM

## 2023-02-22 DIAGNOSIS — M109 Gout, unspecified: Secondary | ICD-10-CM | POA: Insufficient documentation

## 2023-02-22 LAB — CBC
HCT: 37.8 % — ABNORMAL LOW (ref 39.0–52.0)
Hemoglobin: 11.6 g/dL — ABNORMAL LOW (ref 13.0–17.0)
MCH: 28 pg (ref 26.0–34.0)
MCHC: 30.7 g/dL (ref 30.0–36.0)
MCV: 91.3 fL (ref 80.0–100.0)
Platelets: 201 10*3/uL (ref 150–400)
RBC: 4.14 MIL/uL — ABNORMAL LOW (ref 4.22–5.81)
RDW: 15.6 % — ABNORMAL HIGH (ref 11.5–15.5)
WBC: 7.4 10*3/uL (ref 4.0–10.5)
nRBC: 0 % (ref 0.0–0.2)

## 2023-02-22 LAB — BASIC METABOLIC PANEL
Anion gap: 4 — ABNORMAL LOW (ref 5–15)
BUN: 22 mg/dL (ref 8–23)
CO2: 27 mmol/L (ref 22–32)
Calcium: 8.6 mg/dL — ABNORMAL LOW (ref 8.9–10.3)
Chloride: 107 mmol/L (ref 98–111)
Creatinine, Ser: 0.95 mg/dL (ref 0.61–1.24)
GFR, Estimated: >60 mL/min (ref >60)
Glucose, Bld: 126 mg/dL — ABNORMAL HIGH (ref 70–99)
Potassium: 3.8 mmol/L (ref 3.5–5.1)
Sodium: 138 mmol/L (ref 135–145)

## 2023-02-22 LAB — CBG MONITORING, ED
Glucose-Capillary: 118 mg/dL — ABNORMAL HIGH (ref 70–99)
Glucose-Capillary: 128 mg/dL — ABNORMAL HIGH (ref 70–99)

## 2023-02-22 LAB — LIPASE, BLOOD: Lipase: 36 U/L (ref 11–51)

## 2023-02-22 LAB — TROPONIN I (HIGH SENSITIVITY)
Troponin I (High Sensitivity): 22 ng/L — ABNORMAL HIGH (ref <18)
Troponin I (High Sensitivity): 16 ng/L (ref <18)

## 2023-02-22 MED ORDER — ALUM & MAG HYDROXIDE-SIMETH 200-200-20 MG/5ML PO SUSP
30.0000 mL | Freq: Once | ORAL | Status: AC
  Administered 2023-02-22: 30 mL via ORAL
  Filled 2023-02-22: qty 30

## 2023-02-22 MED ORDER — NITROGLYCERIN 0.4 MG SL SUBL
0.4000 mg | SUBLINGUAL_TABLET | SUBLINGUAL | Status: DC | PRN
  Filled 2023-02-22: qty 1

## 2023-02-22 MED ORDER — VENLAFAXINE HCL ER 75 MG PO CP24
75.0000 mg | ORAL_CAPSULE | Freq: Every day | ORAL | Status: DC
  Administered 2023-02-22: 75 mg via ORAL
  Filled 2023-02-22: qty 1

## 2023-02-22 MED ORDER — METRONIDAZOLE 0.75 % EX GEL: Freq: Every day | CUTANEOUS | Status: DC | PRN

## 2023-02-22 MED ORDER — RANOLAZINE ER 1000 MG PO TB12: 1000.0000 mg | ORAL_TABLET | Freq: Two times a day (BID) | ORAL | 0 refills | Status: DC

## 2023-02-22 MED ORDER — AMIODARONE HCL 200 MG PO TABS: 200.0000 mg | ORAL_TABLET | Freq: Two times a day (BID) | ORAL | Status: DC

## 2023-02-22 MED ORDER — MORPHINE SULFATE (PF) 4 MG/ML IV SOLN
4.0000 mg | Freq: Once | INTRAVENOUS | Status: AC
  Administered 2023-02-22: 4 mg via INTRAVENOUS
  Filled 2023-02-22: qty 1

## 2023-02-22 MED ORDER — TRAZODONE HCL 50 MG PO TABS: 150.0000 mg | ORAL_TABLET | Freq: Every day | ORAL | Status: DC

## 2023-02-22 MED ORDER — ACETAMINOPHEN 650 MG RE SUPP: 650.0000 mg | Freq: Four times a day (QID) | RECTAL | Status: DC | PRN

## 2023-02-22 MED ORDER — OXYCODONE-ACETAMINOPHEN 10-325 MG PO TABS: 1.0000 | ORAL_TABLET | ORAL | Status: DC | PRN

## 2023-02-22 MED ORDER — ACETAMINOPHEN 325 MG PO TABS
650.0000 mg | ORAL_TABLET | Freq: Four times a day (QID) | ORAL | Status: DC | PRN
  Filled 2023-02-22: qty 2

## 2023-02-22 MED ORDER — POTASSIUM CHLORIDE CRYS ER 20 MEQ PO TBCR
40.0000 meq | EXTENDED_RELEASE_TABLET | Freq: Once | ORAL | Status: AC
  Administered 2023-02-22: 40 meq via ORAL
  Filled 2023-02-22: qty 2

## 2023-02-22 MED ORDER — INSULIN ASPART 100 UNIT/ML IJ SOLN
0.0000 [IU] | INTRAMUSCULAR | Status: DC
  Administered 2023-02-22: 1 [IU] via SUBCUTANEOUS
  Filled 2023-02-22: qty 1

## 2023-02-22 MED ORDER — SODIUM CHLORIDE 0.9 % IV SOLN: INTRAVENOUS | Status: DC

## 2023-02-22 MED ORDER — OMEGA-3-ACID ETHYL ESTERS 1 G PO CAPS
4.0000 | ORAL_CAPSULE | Freq: Every day | ORAL | Status: DC
  Administered 2023-02-22: 4 g via ORAL
  Filled 2023-02-22: qty 4

## 2023-02-22 MED ORDER — PANTOPRAZOLE SODIUM 40 MG PO TBEC
40.0000 mg | DELAYED_RELEASE_TABLET | Freq: Every day | ORAL | Status: DC
  Administered 2023-02-22: 40 mg via ORAL
  Filled 2023-02-22: qty 1

## 2023-02-22 MED ORDER — ALLOPURINOL 300 MG PO TABS
300.0000 mg | ORAL_TABLET | Freq: Two times a day (BID) | ORAL | Status: DC
  Administered 2023-02-22: 300 mg via ORAL
  Filled 2023-02-22: qty 1

## 2023-02-22 MED ORDER — LINACLOTIDE 72 MCG PO CAPS
72.0000 ug | ORAL_CAPSULE | Freq: Every day | ORAL | Status: DC
  Administered 2023-02-22: 72 ug via ORAL
  Filled 2023-02-22: qty 1

## 2023-02-22 MED ORDER — METHOCARBAMOL 500 MG PO TABS: 500.0000 mg | ORAL_TABLET | Freq: Three times a day (TID) | ORAL | Status: DC | PRN

## 2023-02-22 MED ORDER — RANOLAZINE ER 500 MG PO TB12
1000.0000 mg | ORAL_TABLET | Freq: Two times a day (BID) | ORAL | Status: DC
  Administered 2023-02-22: 1000 mg via ORAL
  Filled 2023-02-22: qty 2

## 2023-02-22 MED ORDER — ISOSORBIDE MONONITRATE ER 60 MG PO TB24: 60.0000 mg | ORAL_TABLET | Freq: Every day | ORAL | Status: DC

## 2023-02-22 MED ORDER — MAGNESIUM HYDROXIDE 400 MG/5ML PO SUSP: 30.0000 mL | Freq: Every day | ORAL | Status: DC | PRN

## 2023-02-22 MED ORDER — SUVOREXANT 10 MG PO TABS: 10.0000 mg | ORAL_TABLET | Freq: Every evening | ORAL | Status: DC | PRN

## 2023-02-22 MED ORDER — ONDANSETRON HCL 4 MG/2ML IJ SOLN
4.0000 mg | Freq: Once | INTRAMUSCULAR | Status: AC
  Administered 2023-02-22: 4 mg via INTRAVENOUS

## 2023-02-22 MED ORDER — TORSEMIDE 20 MG PO TABS
50.0000 mg | ORAL_TABLET | Freq: Every day | ORAL | Status: DC
  Administered 2023-02-22: 50 mg via ORAL
  Filled 2023-02-22: qty 3

## 2023-02-22 MED ORDER — ONDANSETRON HCL 4 MG/2ML IJ SOLN: 4.0000 mg | Freq: Four times a day (QID) | INTRAMUSCULAR | Status: DC | PRN

## 2023-02-22 MED ORDER — ACETAMINOPHEN 325 MG PO TABS: 650.0000 mg | ORAL_TABLET | ORAL | Status: DC | PRN

## 2023-02-22 MED ORDER — LISINOPRIL 10 MG PO TABS
10.0000 mg | ORAL_TABLET | Freq: Every day | ORAL | Status: DC
  Administered 2023-02-22: 10 mg via ORAL
  Filled 2023-02-22: qty 1

## 2023-02-22 MED ORDER — NITROGLYCERIN 0.4 MG SL SUBL
0.4000 mg | SUBLINGUAL_TABLET | Freq: Once | SUBLINGUAL | Status: AC
  Administered 2023-02-22: 0.4 mg via SUBLINGUAL
  Filled 2023-02-22: qty 1

## 2023-02-22 MED ORDER — ASPIRIN 81 MG PO TBEC
81.0000 mg | DELAYED_RELEASE_TABLET | Freq: Every day | ORAL | Status: DC
  Administered 2023-02-22: 81 mg via ORAL
  Filled 2023-02-22: qty 1

## 2023-02-22 MED ORDER — OXYCODONE HCL 5 MG PO TABS
10.0000 mg | ORAL_TABLET | ORAL | Status: DC | PRN
  Administered 2023-02-22: 10 mg via ORAL
  Filled 2023-02-22: qty 2

## 2023-02-22 MED ORDER — DICYCLOMINE HCL 10 MG PO CAPS
10.0000 mg | ORAL_CAPSULE | Freq: Once | ORAL | Status: AC
  Administered 2023-02-22: 10 mg via ORAL
  Filled 2023-02-22: qty 1

## 2023-02-22 MED ORDER — DAPAGLIFLOZIN PROPANEDIOL 10 MG PO TABS
10.0000 mg | ORAL_TABLET | Freq: Every day | ORAL | Status: DC
  Administered 2023-02-22: 10 mg via ORAL
  Filled 2023-02-22: qty 1

## 2023-02-22 MED ORDER — ASPIRIN 81 MG PO TBEC: 81.0000 mg | DELAYED_RELEASE_TABLET | Freq: Every day | ORAL | Status: DC

## 2023-02-22 MED ORDER — KETOROLAC TROMETHAMINE 30 MG/ML IJ SOLN
30.0000 mg | Freq: Once | INTRAMUSCULAR | Status: AC
  Administered 2023-02-22: 30 mg via INTRAVENOUS
  Filled 2023-02-22: qty 1

## 2023-02-22 MED ORDER — ONDANSETRON HCL 4 MG/2ML IJ SOLN
INTRAMUSCULAR | Status: AC
  Filled 2023-02-22: qty 2

## 2023-02-22 MED ORDER — AMIODARONE HCL 200 MG PO TABS
200.0000 mg | ORAL_TABLET | Freq: Every day | ORAL | Status: DC
  Administered 2023-02-22: 200 mg via ORAL
  Filled 2023-02-22: qty 1

## 2023-02-22 MED ORDER — ALPRAZOLAM 0.5 MG PO TABS
1.0000 mg | ORAL_TABLET | Freq: Three times a day (TID) | ORAL | Status: DC
  Administered 2023-02-22: 1 mg via ORAL
  Filled 2023-02-22: qty 2

## 2023-02-22 MED ORDER — ATORVASTATIN CALCIUM 80 MG PO TABS
80.0000 mg | ORAL_TABLET | Freq: Every day | ORAL | Status: DC
  Administered 2023-02-22: 80 mg via ORAL
  Filled 2023-02-22: qty 1
  Filled 2023-02-22: qty 4

## 2023-02-22 MED ORDER — ONDANSETRON HCL 4 MG PO TABS: 4.0000 mg | ORAL_TABLET | Freq: Four times a day (QID) | ORAL | Status: DC | PRN

## 2023-02-22 MED ORDER — ENOXAPARIN SODIUM 60 MG/0.6ML IJ SOSY: 0.5000 mg/kg | PREFILLED_SYRINGE | INTRAMUSCULAR | Status: DC

## 2023-02-22 MED ORDER — TRAZODONE HCL 50 MG PO TABS
25.0000 mg | ORAL_TABLET | Freq: Every evening | ORAL | Status: DC | PRN
  Administered 2023-02-22: 25 mg via ORAL
  Filled 2023-02-22: qty 1

## 2023-02-22 MED ORDER — NALOXONE HCL 4 MG/0.1ML NA LIQD: 1.0000 | Freq: Once | NASAL | Status: DC | PRN

## 2023-02-22 MED ORDER — MECLIZINE HCL 25 MG PO TABS: 12.5000 mg | ORAL_TABLET | Freq: Three times a day (TID) | ORAL | Status: DC | PRN

## 2023-02-22 MED ORDER — APIXABAN 5 MG PO TABS
5.0000 mg | ORAL_TABLET | Freq: Two times a day (BID) | ORAL | Status: DC
  Administered 2023-02-22: 5 mg via ORAL
  Filled 2023-02-22: qty 1

## 2023-02-22 MED ORDER — LORATADINE 10 MG PO TABS
10.0000 mg | ORAL_TABLET | Freq: Every day | ORAL | Status: DC
  Administered 2023-02-22: 10 mg via ORAL
  Filled 2023-02-22: qty 1

## 2023-02-22 MED ORDER — LABETALOL HCL 5 MG/ML IV SOLN: 20.0000 mg | INTRAVENOUS | Status: DC | PRN

## 2023-02-22 NOTE — Assessment & Plan Note (Signed)
-   The patient will be placed on supplement coverage with NovoLog. - We will continue Farxiga and hold off metformin.

## 2023-02-22 NOTE — ED Notes (Signed)
Patient had 4 bowel movements consecutively. Stool was runny and brown. This NT helped clean up patient and placed soiled belongings in patient belonging bags. Patient was cleaned up and returned to bed. Patient is now eating lunch in bed with no other needs at this time.

## 2023-02-22 NOTE — Assessment & Plan Note (Signed)
-   We will continue amiodarone and Eliquis. 

## 2023-02-22 NOTE — Hospital Course (Addendum)
Taken from H&P.  Lawrence [name] is a 67 y.o. Caucasian male with medical history significant for multiple medical problems that are mentioned below including coronary artery disease status post CABG, status post PCI and stents as well as asthma, celiac disease, chronic diastolic CHF, essential hypertension and type 2 diabetes mellitus, who presented to the ER with acute onset of midsternal chest pain that around 10 PM that is graded 10/10 in severity and felt as an elephant sitting on his chest with some sharp pain as well with associated nausea without vomiting and with associated cold diaphoresis as well as dyspnea and palpitations.  Patient did fall in the bathroom a day prior with right posterior thigh bruise, no other injuries.  ED course.  On arrival blood pressure elevated at 179/104, labs pertinent for hypokalemia at 3.3.  Troponin 23>> 22.  Rest stable.  EKG  Showed atrial fibrillation with slightly rapid ventricular sponsor of 101 with old Q waves inferiorly. Imaging: Portable chest x-ray showed cardiomegaly with no acute cardiopulmonary disease.  The patient was given sublingual nitroglycerin and, 4 mg of IV morphine sulfate and 4 mg of IV Zofran as well as 40 mill equivalent p.o. potassium chloride.   4/8; vitals stable.  Continue to have chest pain-ordered 1 dose of Toradol.  Patient has reproducible chest pain which appears to be musculoskeletal in origin.  Lipase was checked and it was normal.  Troponin negative.  Cardiology saw him and cleared him for discharge.  They added Ranexa and will follow-up as outpatient.  Apparently multiple ED visits for similar reasons when he was asking for morphine.  Patient will continue on current medications and need to have a close follow-up with his providers for further recommendations.

## 2023-02-22 NOTE — ED Notes (Signed)
Patient had an episode of loose stool. Patient was cleaned up and returned to bed.

## 2023-02-22 NOTE — ED Notes (Signed)
Pt transferred from Main ED c/o 8/10 chest pain.  Nitro paste in place. 2LNC applied. Pt refused Nitro.  MD notified. Administered ordered pain med will continue plan of care.

## 2023-02-22 NOTE — Assessment & Plan Note (Signed)
-   We will continue statin therapy and Lovaza. 

## 2023-02-22 NOTE — Progress Notes (Signed)
PHARMACIST - PHYSICIAN COMMUNICATION  CONCERNING:  Enoxaparin (Lovenox) for DVT Prophylaxis    RECOMMENDATION: Patient was prescribed enoxaprin 40mg  q24 hours for VTE prophylaxis.   Filed Weights   02/21/23 2305  Weight: 87.5 kg (193 lb)    Body mass index is 30.23 kg/m.  Estimated Creatinine Clearance: 80.1 mL/min (by C-G formula based on SCr of 0.92 mg/dL).   Based on [facility] policy patient is candidate for enoxaparin 0.5mg /kg TBW SQ every 24 hours based on BMI being >30.  DESCRIPTION: Pharmacy has adjusted enoxaparin dose per [facility] policy.  Patient is now receiving enoxaparin 0.5 mg/kg every 24 hours   Otelia Sergeant, PharmD, [facility] 02/22/2023 3:22 AM

## 2023-02-22 NOTE — Assessment & Plan Note (Signed)
>>  ASSESSMENT AND PLAN FOR TYPE 2 DIABETES MELLITUS WITHOUT COMPLICATIONS WRITTEN ON 02/22/2023  4:53 AM BY MANSY, JAN A, MD  - The patient will be placed on supplement coverage with NovoLog. - We will continue Farxiga and hold off metformin.

## 2023-02-22 NOTE — Assessment & Plan Note (Signed)
-   We will continue allopurinol 

## 2023-02-22 NOTE — Consult Note (Signed)
[facility] CLINIC CARDIOLOGY CONSULT NOTE       Patient ID: TYQUAVION CIMINELLI MRN: [id number] DOB/AGE: 1956-08-21 68 y.o.  Admit date: 02/21/2023 Referring Physician Dr. Valente David Primary Physician Dr. [name] Primary Cardiologist Dr. Juliann Pares Reason for Consultation chest pain  HPI: Oluwademilade R. Mercer is a 80yoM with a PMH of CAD (h/o STEMI s/p CABG x2 LIMA-LAD and SVG-OM1 2002 c/b coronary artery dissection with multiple subsequent PCI and stents), chronic stable angina, HFpEF (50-55%, mild LVH 10/2022), permanent atrial fibrillation s/p unsuccessful DCCV 06/29/2022, COPD (PRN 2L), hx tobacco use, DM2 who presented to [facility] ED 02/21/2023 with chest pain.  Cardiology is consulted for further assistance.  The patient is well-known to our service from multiple ED visits and hospitalizations. His last LHC was in 11/02/22 where his coronary disease was actually improved from the prior cath 04/2021, there was no significant obstructive disease requiring intervention, and aggressive medical therapy was recommended.   He presents with 10/10 chest pain that started at rest, took 4 baby aspirin and SL nitro 3x without relief at home. He has chronic back pain for which his PCP prescribes oxycodone. He admits to some shortness of breath he attributes to his COPD/emphysema. Occasionally has palpitations and heart racing he states is because he has AF. No peripheral edema, orthopnea. States the only thing that helps his pain is morphine. Multiple ED visits over the past several months for the same. Troponins 23, 22. EKGs show AF rate 101, inferior Q waves present with essentially no change on past 3 EKGs.   Review of systems complete and found to be negative unless listed above     Past Medical History:  Diagnosis Date   A-fib    Anemia    Anginal pain    Anxiety    Arthritis    Asthma    CAD (coronary artery disease)    a. 2002 CABGx2 (LIMA->LAD, VG->VG->OM1);  b. 09/2012 DES->OM;  c. 03/2015 PTCA of  LAD [facility]) in setting of atretic LIMA; d. 05/2015 Cath [facility]): nonobs dzs; e. 06/2015 Cath ([facility]): LM nl, LAD 45p/d ISR, 50d, D1/2 small, LCX 50p/d ISR, OM1 70ost, 30 ISR, VG->OM1 50ost, 68m, LIMA->LAD 99p/d - atretic, RCA dom, nl; f.cath 10/16: 40-50%(FFR 0.90) pLAD, 75% (FFR 0.77) mLAD s/p PCI/DES, oRCA 40% (FFR0.95)   Cancer    SKIN CANCER ON BACK   Celiac disease    Chronic diastolic CHF (congestive heart failure)    a. 06/2009 Echo: EF 60-65%, Gr 1 DD, triv AI, mildly dil LA, nl RV.   COPD (chronic obstructive pulmonary disease)    a. Chronic bronchitis and emphysema.   DDD (degenerative disc disease), lumbar    Diverticulosis    Dysrhythmia    Essential hypertension    GERD (gastroesophageal reflux disease)    History of hiatal hernia    History of kidney stones    H/O   History of tobacco abuse    a. Quit 2014.   Myocardial infarction 2002   4 STENTS   Pancreatitis    PSVT (paroxysmal supraventricular tachycardia)    a. 10/2012 Noted on Zio Patch.   Sleep apnea    LOST CORD TO CPAP -ONLY 02 @ BEDTIME   Tubular adenoma of colon    Type II diabetes mellitus     Past Surgical History:  Procedure Laterality Date   BYPASS GRAFT     CARDIAC CATHETERIZATION N/A 07/12/2015   rocedure: Left Heart Cath and Cors/Grafts Angiography;  Surgeon: Barry Dienes  Katrinka BlazingSmith, MD;  Location: [facility] INVASIVE CV LAB;  Service: Cardiovascular;  Laterality: N/A;   CARDIAC CATHETERIZATION Right 10/07/2015   Procedure: Left Heart Cath and Cors/Grafts Angiography;  Surgeon: Laurier NancyShaukat A Khan, MD;  Location: [facility] INVASIVE CV LAB;  Service: Cardiovascular;  Laterality: Right;   CARDIAC CATHETERIZATION N/A 04/06/2016   Procedure: Left Heart Cath and Coronary Angiography;  Surgeon: Alwyn Peawayne D Callwood, MD;  Location: [facility] INVASIVE CV LAB;  Service: Cardiovascular;  Laterality: N/A;   CARDIAC CATHETERIZATION  04/06/2016   Procedure: Bypass Graft Angiography;  Surgeon: Alwyn Peawayne D Callwood, MD;  Location: [facility] INVASIVE CV LAB;   Service: Cardiovascular;;   CARDIAC CATHETERIZATION N/A 11/02/2016   Procedure: Left Heart Cath and Cors/Grafts Angiography and possible PCI;  Surgeon: Alwyn Peawayne D Callwood, MD;  Location: [facility] INVASIVE CV LAB;  Service: Cardiovascular;  Laterality: N/A;   CARDIAC CATHETERIZATION N/A 11/02/2016   Procedure: Coronary Stent Intervention;  Surgeon: Alwyn Peawayne D Callwood, MD;  Location: [facility] INVASIVE CV LAB;  Service: Cardiovascular;  Laterality: N/A;   CARDIOVERSION N/A 06/29/2022   Procedure: CARDIOVERSION;  Surgeon: Lamar BlinksKowalski, Bruce J, MD;  Location: [facility] ORS;  Service: Cardiovascular;  Laterality: N/A;   CHOLECYSTECTOMY     CIRCUMCISION N/A 06/09/2019   Procedure: CIRCUMCISION ADULT;  Surgeon: Sondra ComeSninsky, Brian C, MD;  Location: [facility] ORS;  Service: Urology;  Laterality: N/A;   COLONOSCOPY WITH PROPOFOL N/A 04/01/2018   Procedure: COLONOSCOPY WITH PROPOFOL;  Surgeon: Scot JunElliott, Robert T, MD;  [facility];  Service: Endoscopy;  Laterality: N/A;   ESOPHAGEAL DILATION     ESOPHAGOGASTRODUODENOSCOPY (EGD) WITH PROPOFOL N/A 04/01/2018   Procedure: ESOPHAGOGASTRODUODENOSCOPY (EGD) WITH PROPOFOL;  Surgeon: Scot JunElliott, Robert T, MD;  [facility];  Service: Endoscopy;  Laterality: N/A;   LEFT HEART CATH AND CORS/GRAFTS ANGIOGRAPHY N/A 06/12/2019   Procedure: LEFT HEART CATH AND CORS/GRAFTS ANGIOGRAPHY;  Surgeon: Dalia HeadingFath, Kenneth A, MD;  Location: [facility] INVASIVE CV LAB;  Service: Cardiovascular;  Laterality: N/A;   LEFT HEART CATH AND CORS/GRAFTS ANGIOGRAPHY N/A 03/11/2020   Procedure: LEFT HEART CATH AND CORS/GRAFTS ANGIOGRAPHY;  Surgeon: Marcina MillardParaschos, Alexander, MD;  Location: [facility] INVASIVE CV LAB;  Service: Cardiovascular;  Laterality: N/A;   LEFT HEART CATH AND CORS/GRAFTS ANGIOGRAPHY N/A 05/01/2021   Procedure: LEFT HEART CATH AND CORS/GRAFTS ANGIOGRAPHY;  Surgeon: Lamar BlinksKowalski, Bruce J, MD;  Location: [facility] INVASIVE CV LAB;  Service: Cardiovascular;  Laterality: N/A;   LEFT HEART CATH AND CORS/GRAFTS  ANGIOGRAPHY N/A 11/02/2022   Procedure: LEFT HEART CATH AND CORS/GRAFTS ANGIOGRAPHY;  Surgeon: Alwyn Peaallwood, Dwayne D, MD;  Location: [facility] INVASIVE CV LAB;  Service: Cardiovascular;  Laterality: N/A;   TONSILLECTOMY     VASCULAR SURGERY      (Not in a hospital admission)  Social History   Socioeconomic History   Marital status: Widowed    Spouse name: Not on file   Number of children: 1   Years of education: Not on file   Highest education level: 10th grade  Occupational History   Occupation: Disabled   Occupation: on [employer]  Tobacco Use   Smoking status: Former    Packs/day: 3.00    Years: 50.00    Additional pack years: 0.00    Total pack years: 150.00    Types: Cigarettes    Quit date: 04/22/2013    Years since quitting: 9.8   Smokeless tobacco: Never   Tobacco comments:    Reports not smoking for approx 8 years.  Vaping Use   Vaping Use: Never used  Substance and Sexual Activity  Alcohol use: No    Comment: remotely quit alcohol use. Hx of heavy alcohol use.   Drug use: Yes    Types: Marijuana    Comment: occasionally   Sexual activity: Not on file  Other Topics Concern   Not on file  Social History Narrative   Pt lives in [place] with wife.  Does not routinely exercise.   Social Determinants of Health   Financial Resource Strain: Low Risk  (01/08/2022)   Overall Financial Resource Strain (CARDIA)    Difficulty of Paying Living Expenses: Not hard at all  Food Insecurity: No Food Insecurity (01/04/2023)   Hunger Vital Sign    Worried About Running Out of Food in the Last Year: Never true    Ran Out of Food in the Last Year: Never true  Transportation Needs: No Transportation Needs (01/04/2023)   PRAPARE - Administrator, Civil Service (Medical): No    Lack of Transportation (Non-Medical): No  Physical Activity: Insufficiently Active (01/08/2022)   Exercise Vital Sign    Days of Exercise per Week: 3 days    Minutes of Exercise per Session:  20 min  Stress: No Stress Concern Present (01/08/2022)   [employer] of Occupational Health - Occupational Stress Questionnaire    Feeling of Stress : Not at all  Social Connections: Moderately Isolated (01/08/2022)   Social Connection and Isolation Panel [NHANES]    Frequency of Communication with Friends and Family: More than three times a week    Frequency of Social Gatherings with Friends and Family: More than three times a week    Attends Religious Services: More than 4 times per year    Active Member of [employer] or Organizations: No    Attends Banker Meetings: Never    Marital Status: Widowed  Intimate Partner Violence: Not At Risk (10/31/2022)   Humiliation, Afraid, Rape, and Kick questionnaire    Fear of Current or Ex-Partner: No    Emotionally Abused: No    Physically Abused: No    Sexually Abused: No    Family History  Problem Relation Age of Onset   Heart attack Mother    Depression Mother    Heart disease Mother    COPD Mother    Hypertension Mother    Heart attack Father    Diabetes Father    Depression Father    Heart disease Father    Cirrhosis Father    Parkinson's disease Brother      No intake or output data in the 24 hours ending 02/22/23 0811  Vitals:   02/22/23 0306 02/22/23 0600 02/22/23 0700 02/22/23 0730  BP:  127/85 119/87 (!) 141/99  Pulse:  73 69 (!) 52  Resp:  15 13 14   Temp: 98.1 F (36.7 C)   97.7 F (36.5 C)  TempSrc: Oral   Oral  SpO2:  (!) 89% 90% 92%  Weight:      Height:        PHYSICAL EXAM General: chronically ill appearing caucasian male, in no acute distress. Laying at low incline in bed.  HEENT:  Normocephalic and atraumatic. Neck:  No JVD.  Lungs: Normal respiratory effort on O2 by [place]. Clear bilaterally to auscultation. No wheezes, crackles, rhonchi.  Chest: well healed median sternotomy scar, reproducible tenderness to palpation along the sternum  Heart: irregular irregular with controlled rate . Normal  S1 and S2 without gallops or murmurs.  Abdomen: Non-distended appearing.  Msk: Normal strength and tone for age.  Extremities: Warm and well perfused. No clubbing, cyanosis. No peripheral edema.  Neuro: Alert and oriented X 3. Psych:  Answers questions appropriately.   Labs: Basic Metabolic Panel: Recent Labs    02/21/23 2304  NA 138  K 3.3*  CL 104  CO2 26  GLUCOSE 136*  BUN 19  CREATININE 0.92  CALCIUM 8.7*   Liver Function Tests: Recent Labs    02/21/23 2304  AST 14*  ALT 12  ALKPHOS 67  BILITOT 0.9  PROT 6.5  ALBUMIN 3.5   No results for input(s): "LIPASE", "AMYLASE" in the last 72 hours. CBC: Recent Labs    02/21/23 2304  WBC 10.2  NEUTROABS 8.1*  HGB 12.9*  HCT 41.1  MCV 88.8  PLT 241   Cardiac Enzymes: Recent Labs    02/21/23 2304 02/22/23 0225  TROPONINIHS 23* 22*   BNP: No results for input(s): "BNP" in the last 72 hours. D-Dimer: No results for input(s): "DDIMER" in the last 72 hours. Hemoglobin A1C: No results for input(s): "HGBA1C" in the last 72 hours. Fasting Lipid Panel: No results for input(s): "CHOL", "HDL", "LDLCALC", "TRIG", "CHOLHDL", "LDLDIRECT" in the last 72 hours. Thyroid Function Tests: No results for input(s): "TSH", "T4TOTAL", "T3FREE", "THYROIDAB" in the last 72 hours.  Invalid input(s): "FREET3" Anemia Panel: No results for input(s): "VITAMINB12", "FOLATE", "FERRITIN", "TIBC", "IRON", "RETICCTPCT" in the last 72 hours.   Radiology: DG Chest 2 View  Result Date: 02/21/2023 CLINICAL DATA:  Chest pain EXAM: CHEST - 2 VIEW COMPARISON:  Chest x-ray 02/16/2023 FINDINGS: The heart is enlarged. Median sternotomy wires and mediastinal clips are present. There is no focal lung consolidation, pleural effusion or pneumothorax. There is minimal bibasilar atelectasis. No acute fractures are seen. IMPRESSION: 1. No active cardiopulmonary disease. 2. Cardiomegaly. Electronically Signed   By: Darliss Cheney M.D.   On: 02/21/2023 23:26    DG Chest Portable 1 View  Result Date: 02/16/2023 CLINICAL DATA:  Bilateral leg pain. EXAM: PORTABLE CHEST 1 VIEW COMPARISON:  February 09, 2023 FINDINGS: Multiple sternal wires and vascular clips are noted. The heart size and mediastinal contours are within normal limits. A coronary artery stent is seen. There is marked severity calcification of the aortic arch. Chronic appearing increased lung markings are seen with mild atelectasis noted within the bilateral lung bases. Multilevel degenerative changes are seen throughout the thoracic spine. IMPRESSION: 1. Evidence of prior median sternotomy/CABG. 2. Mild bibasilar atelectasis. Electronically Signed   By: Aram Candela M.D.   On: 02/16/2023 21:45   DG Chest Portable 1 View  Result Date: 02/09/2023 CLINICAL DATA:  Chest pain. EXAM: PORTABLE CHEST 1 VIEW COMPARISON:  February 06, 2023. FINDINGS: Stable cardiomediastinal silhouette. Sternotomy wires are noted. Minimal bibasilar subsegmental atelectasis is noted. Bony thorax is unremarkable. IMPRESSION: Minimal bibasilar subsegmental atelectasis. Electronically Signed   By: Lupita Raider M.D.   On: 02/09/2023 21:14   DG Chest 2 View  Result Date: 02/06/2023 CLINICAL DATA:  Right-sided chest pain. EXAM: CHEST - 2 VIEW COMPARISON:  Chest radiograph dated 01/27/2023. FINDINGS: No focal consolidation, pleural effusion, or pneumothorax. There is mild cardiomegaly with mild central vascular congestion. Median sternotomy wires and CABG vascular clips. No acute osseous pathology. IMPRESSION: Mild cardiomegaly with mild central vascular congestion. No focal consolidation. Electronically Signed   By: Elgie Collard M.D.   On: 02/06/2023 21:31   CT HEAD WO CONTRAST ( )  Result Date: 02/04/2023 CLINICAL DATA:  Trauma. EXAM: CT HEAD WITHOUT CONTRAST CT CERVICAL SPINE WITHOUT CONTRAST TECHNIQUE:  Multidetector CT imaging of the head and cervical spine was performed following the standard protocol without  intravenous contrast. Multiplanar CT image reconstructions of the cervical spine were also generated. RADIATION DOSE REDUCTION: This exam was performed according to the departmental dose-optimization program which includes automated exposure control, adjustment of the mA and/or kV according to patient size and/or use of iterative reconstruction technique. COMPARISON:  Cervical spine CT dated 01/18/2023. Head CT dated 01/27/2023. FINDINGS: CT HEAD FINDINGS Brain: The ventricles and sulci are appropriate size for the patient's age. The gray-white matter discrimination is preserved. There is no acute intracranial hemorrhage. No mass effect or midline shift. No extra-axial fluid collection. Vascular: No hyperdense vessel or unexpected calcification. Skull: Normal. Negative for fracture or focal lesion. Sinuses/Orbits: Diffuse mucoperiosteal thickening of paranasal sinuses. No air-fluid level. The mastoid air cells are clear. Other: None CT CERVICAL SPINE FINDINGS Alignment: No acute subluxation. Skull base and vertebrae: No acute fracture.  Osteopenia. Soft tissues and spinal canal: No prevertebral fluid or swelling. No visible canal hematoma. Disc levels:  No acute findings. Upper chest: Negative. Other: Bilateral carotid bulb calcified plaques. IMPRESSION: 1. No acute intracranial pathology. 2. No acute/traumatic cervical spine pathology. 3. Chronic paranasal sinus disease. Electronically Signed   By: Elgie Collard M.D.   On: 02/04/2023 20:43   CT Cervical Spine Wo Contrast  Result Date: 02/04/2023 CLINICAL DATA:  Trauma. EXAM: CT HEAD WITHOUT CONTRAST CT CERVICAL SPINE WITHOUT CONTRAST TECHNIQUE: Multidetector CT imaging of the head and cervical spine was performed following the standard protocol without intravenous contrast. Multiplanar CT image reconstructions of the cervical spine were also generated. RADIATION DOSE REDUCTION: This exam was performed according to the departmental dose-optimization program  which includes automated exposure control, adjustment of the mA and/or kV according to patient size and/or use of iterative reconstruction technique. COMPARISON:  Cervical spine CT dated 01/18/2023. Head CT dated 01/27/2023. FINDINGS: CT HEAD FINDINGS Brain: The ventricles and sulci are appropriate size for the patient's age. The gray-white matter discrimination is preserved. There is no acute intracranial hemorrhage. No mass effect or midline shift. No extra-axial fluid collection. Vascular: No hyperdense vessel or unexpected calcification. Skull: Normal. Negative for fracture or focal lesion. Sinuses/Orbits: Diffuse mucoperiosteal thickening of paranasal sinuses. No air-fluid level. The mastoid air cells are clear. Other: None CT CERVICAL SPINE FINDINGS Alignment: No acute subluxation. Skull base and vertebrae: No acute fracture.  Osteopenia. Soft tissues and spinal canal: No prevertebral fluid or swelling. No visible canal hematoma. Disc levels:  No acute findings. Upper chest: Negative. Other: Bilateral carotid bulb calcified plaques. IMPRESSION: 1. No acute intracranial pathology. 2. No acute/traumatic cervical spine pathology. 3. Chronic paranasal sinus disease. Electronically Signed   By: Elgie Collard M.D.   On: 02/04/2023 20:43   DG Lumbar Spine Complete  Result Date: 02/04/2023 CLINICAL DATA:  Low back pain and left knee pain.  Fall injury. EXAM: LUMBAR SPINE - COMPLETE 4+ VIEW; LEFT KNEE - COMPLETE 4+ VIEW COMPARISON:  Recent lumbar spine series 01/18/2023, left knee series 06/03/2015 FINDINGS: Lumbar routine five view series: There previously was thought to be a cortical step-off anteriorly at S4 and at the junction of S5 and the coccyx but no cortical step-off is seen on today's images. Five lumbar segments are again noted. There is osteopenia. There is preservation of the normal vertebral heights apart from mild chronic anterior wedging of the T11 and 12 vertebral bodies and slight wedging of  L1. No acute fracture is evident. Grade 1 degenerative anterolisthesis  is again noted at L4-5 with otherwise normal alignment. Degenerative disc height loss is again noted at T11-12 and T12-L1. In the lumbar spine there is preservation of the disc heights. Facet joint hypertrophy is seen from L2-3 down greatest at L4-5. Superiorly ankylosed SI joints are again shown. Moderate to heavy aortoiliac calcific plaque. There are cholecystectomy clips. Left knee, routine 4 views: Moderate to heavy patchy calcification again noted in the right femoral artery. There are surgical clips in the dorsomedial soft tissues. No significant suprapatellar bursal effusion. Joint spaces are maintained. There is trace marginal spurring at the patellofemoral joint, small enthesopathic spurring of the anterior superior patella. There is no evidence of fracture or dislocation.  Mild osteopenia. IMPRESSION: 1. Lumbar osteopenia and degenerative changes without evidence of acute fracture or acute malalignment. 2. Chronic anterior wedging of T11 and T12 and slight wedging of L1. Chronic degenerative grade 1 L4-5 spondylolisthesis. 3. Aortic atherosclerosis. 4. Trace marginal spurring of the patellofemoral joint. No evidence of fracture or dislocation of the left knee. 5. Atherosclerosis of the right femoral artery. Electronically Signed   By: Almira Bar M.D.   On: 02/04/2023 20:27   DG Knee Complete 4 Views Left  Result Date: 02/04/2023 CLINICAL DATA:  Low back pain and left knee pain.  Fall injury. EXAM: LUMBAR SPINE - COMPLETE 4+ VIEW; LEFT KNEE - COMPLETE 4+ VIEW COMPARISON:  Recent lumbar spine series 01/18/2023, left knee series 06/03/2015 FINDINGS: Lumbar routine five view series: There previously was thought to be a cortical step-off anteriorly at S4 and at the junction of S5 and the coccyx but no cortical step-off is seen on today's images. Five lumbar segments are again noted. There is osteopenia. There is preservation of the  normal vertebral heights apart from mild chronic anterior wedging of the T11 and 12 vertebral bodies and slight wedging of L1. No acute fracture is evident. Grade 1 degenerative anterolisthesis is again noted at L4-5 with otherwise normal alignment. Degenerative disc height loss is again noted at T11-12 and T12-L1. In the lumbar spine there is preservation of the disc heights. Facet joint hypertrophy is seen from L2-3 down greatest at L4-5. Superiorly ankylosed SI joints are again shown. Moderate to heavy aortoiliac calcific plaque. There are cholecystectomy clips. Left knee, routine 4 views: Moderate to heavy patchy calcification again noted in the right femoral artery. There are surgical clips in the dorsomedial soft tissues. No significant suprapatellar bursal effusion. Joint spaces are maintained. There is trace marginal spurring at the patellofemoral joint, small enthesopathic spurring of the anterior superior patella. There is no evidence of fracture or dislocation.  Mild osteopenia. IMPRESSION: 1. Lumbar osteopenia and degenerative changes without evidence of acute fracture or acute malalignment. 2. Chronic anterior wedging of T11 and T12 and slight wedging of L1. Chronic degenerative grade 1 L4-5 spondylolisthesis. 3. Aortic atherosclerosis. 4. Trace marginal spurring of the patellofemoral joint. No evidence of fracture or dislocation of the left knee. 5. Atherosclerosis of the right femoral artery. Electronically Signed   By: Almira Bar M.D.   On: 02/04/2023 20:27   US Venous Img Lower Unilateral Left  Result Date: 01/29/2023 CLINICAL DATA:  Left leg pain EXAM: Left LOWER EXTREMITY VENOUS DOPPLER ULTRASOUND TECHNIQUE: Gray-scale sonography with compression, as well as color and duplex ultrasound, were performed to evaluate the deep venous system(s) from the level of the common femoral vein through the popliteal and proximal calf veins. COMPARISON:  None Available. FINDINGS: VENOUS Normal  compressibility of the common femoral, superficial  femoral, and popliteal veins, as well as the visualized calf veins. Visualized portions of profunda femoral vein and great saphenous vein unremarkable. No filling defects to suggest DVT on grayscale or color Doppler imaging. Doppler waveforms show normal direction of venous flow, normal respiratory plasticity and response to augmentation. Limited views of the contralateral common femoral vein are unremarkable. OTHER None. Limitations: none IMPRESSION: Negative. Electronically Signed   By: Minerva Fester M.D.   On: 01/29/2023 01:42   CT Angio Chest/Abd/Pel for Dissection W and/or Wo Contrast  Result Date: 01/27/2023 CLINICAL DATA:  Chest pain, headache, and dizziness. Symptoms for 1 hour EXAM: CT ANGIOGRAPHY CHEST, ABDOMEN AND PELVIS TECHNIQUE: Non-contrast CT of the chest was initially obtained. Multidetector CT imaging through the chest, abdomen and pelvis was performed using the standard protocol during bolus administration of intravenous contrast. Multiplanar reconstructed images and MIPs were obtained and reviewed to evaluate the vascular anatomy. RADIATION DOSE REDUCTION: This exam was performed according to the departmental dose-optimization program which includes automated exposure control, adjustment of the mA and/or kV according to patient size and/or use of iterative reconstruction technique. CONTRAST:  OMNIPAQUE IOHEXOL 350 MG/ML SOLN COMPARISON:  10/07/2020 chest CT. FINDINGS: CTA CHEST FINDINGS Cardiovascular: Preferential opacification of the thoracic aorta. No evidence of thoracic aortic aneurysm or dissection. Normal heart size. No pericardial effusion. Atheromatous calcification of the aorta and coronaries that is extensive. There has been CABG. LIMA enhancement is suboptimally visualized, a venous graft extending towards the left is well enhancing proximally. Mediastinum/Nodes: Negative for mass or adenopathy. Unremarkable esophagus.  Lungs/Pleura: There is no edema, consolidation, effusion, or pneumothorax. 7 x 4 mm pulmonary nodule in the right middle lobe on 6:96. This is new/larger than seen in 2021. Musculoskeletal: No acute or aggressive finding Review of the MIP images confirms the above findings. CTA ABDOMEN AND PELVIS FINDINGS VASCULAR Aorta: Atheromatous wall thickening.  No dissection or aneurysm Celiac: Vessels are smooth and widely patent. SMA: Vessels are smooth and widely patent Renals: Vessels are smooth and widely patent IMA: Widely patent Inflow: Diffuse atheromatous plaque. Veins: Unremarkable Review of the MIP images confirms the above findings. NON-VASCULAR Hepatobiliary: No focal liver abnormality.No evidence of biliary obstruction or stone. Pancreas: Unremarkable. Spleen: Unremarkable. Adrenals/Urinary Tract: Negative adrenals. No hydronephrosis or stone. Unremarkable bladder. Stomach/Bowel:  No obstruction. Colonic diverticulosis. Lymphatic: No mass or adenopathy. Reproductive:No pathologic findings. Other: No ascites or pneumoperitoneum. Musculoskeletal: No acute abnormalities. Review of the MIP images confirms the above findings. IMPRESSION: 1. No acute finding including signs of acute aortic syndrome. 2. 6 mm solid nodule in the right middle lobe. Non-contrast chest CT at 6-12 months is recommended. If the nodule is stable at time of repeat CT, then future CT at 18-24 months (from today's scan) is considered optional for low-risk patients, but is recommended for high-risk patients. This recommendation follows the consensus statement: Guidelines for Management of Incidental Pulmonary Nodules Detected on CT Images: From the Fleischner Society 2017; Radiology 2017; 284:228-243. 3.  Aortic Atherosclerosis (ICD10-I70.0). Electronically Signed   By: Tiburcio Pea M.D.   On: 01/27/2023 04:29   CT Head Wo Contrast  Result Date: 01/27/2023 CLINICAL DATA:  Head trauma EXAM: CT HEAD WITHOUT CONTRAST TECHNIQUE: Contiguous  axial images were obtained from the base of the skull through the vertex without intravenous contrast. RADIATION DOSE REDUCTION: This exam was performed according to the departmental dose-optimization program which includes automated exposure control, adjustment of the mA and/or kV according to patient size and/or use of iterative reconstruction  technique. COMPARISON:  CT brain 01/18/2023 FINDINGS: Brain: No acute territorial infarction, hemorrhage, or intracranial mass. Mild atrophy. Stable ventricle size. Vascular: No hyperdense vessels.  No unexpected calcification Skull: Normal. Negative for fracture or focal lesion. Sinuses/Orbits: Mucosal thickening in the sinuses Other: None IMPRESSION: No CT evidence for acute intracranial abnormality. Mild atrophy. Electronically Signed   By: Jasmine Pang M.D.   On: 01/27/2023 02:01   DG Chest Port 1 View  Result Date: 01/27/2023 CLINICAL DATA:  Chest pain short of breath EXAM: PORTABLE CHEST 1 VIEW COMPARISON:  01/13/2023 FINDINGS: Post sternotomy changes. Cardiomegaly with mild central congestion. No acute airspace disease, pleural effusion or pneumothorax. Aortic atherosclerosis IMPRESSION: Cardiomegaly with mild central congestion. Electronically Signed   By: Jasmine Pang M.D.   On: 01/27/2023 01:12    ECHO 11/02/2022  1. Left ventricular ejection fraction, by estimation, is 50 to 55%. The  left ventricle has low normal function. Left ventricular endocardial  border not optimally defined to evaluate regional wall motion. There is  mild left ventricular hypertrophy. Left  ventricular diastolic parameters are indeterminate.   2. Right ventricular systolic function is normal. The right ventricular  size is normal. Tricuspid regurgitation signal is inadequate for assessing  PA pressure.   3. Left atrial size was moderately dilated.   4. The mitral valve is normal in structure. Trivial mitral valve  regurgitation. No evidence of mitral stenosis.   5. The  aortic valve is normal in structure. Aortic valve regurgitation is  trivial. Aortic valve sclerosis is present, with no evidence of aortic  valve stenosis.   TELEMETRY reviewed by me (LT) 02/22/2023 : Atrial fibrillation rate 70s to 80s  EKG reviewed by me: AF rate 101 inferior Q waves without significant change from past 2 EKG from 01/2023  Data reviewed by me (LT) 02/22/2023:   Principal Problem:   Chest pain Active Problems:   Hypertensive urgency   Dyslipidemia   Type 2 diabetes mellitus without complications   Paroxysmal atrial fibrillation with RVR   Gout    ASSESSMENT AND PLAN:  [place]. [place] is a 63yoM with a PMH of CAD (h/o STEMI s/p CABG x2 LIMA-LAD and SVG-OM1 2002 c/b coronary artery dissection with multiple subsequent PCI and stents), chronic stable angina, HFpEF (50-55%, mild LVH 10/2022), permanent atrial fibrillation s/p unsuccessful DCCV 06/29/2022, COPD (PRN 2L), hx tobacco use, DM2 who presented to [facility] ED 02/21/2023 with chest pain.  Cardiology is consulted for further assistance.  # atypical chest pain # CAD s/p CABG x 2 The patient has a longstanding hx of CAD and chronic angina, last LHC in 10/2022 showed stable CAD without new obstructive disease requiring intervention. Presents with 10/10 chest pain unrelieved by ASA or SL nitro at home. Constant at a 8/10 and per patient report only relieved with IV morphine and he refused nitro this AM. On exam, patient is comfortable appearing, slightly hypertensive, and his chest pain is reproducible to palpation along his sternum. Troponins borderline but downtrending at 23, 22 and EKGs stable without new ischemic changes inconsistent with ACS. Highly suspect non-cardiac chest pain, likely musculoskeletal in etiology.  -S/p 325 mg aspirin, Continue 81 mg aspirin daily with Eliquis 5 mg twice daily for stroke prevention -Defer heparin drip - continue atorva 80mg  daily  -Continue isosorbide -Restart Ranexa 1000 mg twice  daily -minimize use of IV opioids -consider referral to pain management -defer additional cardiac diagnostics at this time.   # permanent AF Rate controlled in the 70s-80s -  continue amiodarone at 200mg  once daily - no longer on metoprolol or diltiazem with bradycardia last admission in 12/2022 - continue eliquis 5mg  BID for stroke prevention   # chronic HFpEF (50-55%, mild LVH 10/2022)  Euvolemic on exam. Continue GDMT: imdur, lisinopril 10mg  daily, jardiance 10, torsemide 50mg  daily.   Ok for discharge today from a cardiac perspective. Will arrange for follow up in clinic with Dr. Juliann Pares in 1-2 weeks.    This patient's plan of care was discussed and created with Dr. Darrold Junker and he is in agreement.  Signed: Rebeca Allegra , PA-C 02/22/2023, 8:11 AM [facility]

## 2023-02-22 NOTE — Assessment & Plan Note (Addendum)
-   The patient will be admitted to an observation cardiac telemetry bed. - The patient is at high risk for cardiac etiology - Will follow serial troponins and EKGs. - The patient will be placed on aspirin as well as p.r.n. sublingual nitroglycerin and morphine sulfate for pain. - We will obtain a cardiology consult in a.m. for further cardiac risk stratification. - I notified Dr. Juliann Pares about the patient

## 2023-02-22 NOTE — H&P (Addendum)
[place]   PATIENT NAME: Lawrence [name]    MR#:  [id number]  DATE OF BIRTH:  03-18-1956  DATE OF ADMISSION:  02/21/2023  PRIMARY CARE PHYSICIAN: Malva Limes, MD   Patient is coming from: Home  REQUESTING/REFERRING PHYSICIAN: Georga Hacking, MD  CHIEF COMPLAINT:   Chief Complaint  Patient presents with   Chest Pain    HISTORY OF PRESENT ILLNESS:  AMRIT NEVELLS is a 67 y.o. Caucasian male with medical history significant for multiple medical problems that are mentioned below including coronary artery disease status post CABG, status post PCI and stents as well as asthma, celiac disease, chronic diastolic CHF, essential hypertension and type 2 diabetes mellitus, who presented to the ER with acute onset of midsternal chest pain that around 10 PM that is graded 10/10 in severity and felt as an elephant sitting on his chest with some sharp pain as well with associated nausea without vomiting and with associated cold diaphoresis as well as dyspnea and palpitations.  He denied any leg pain or edema or recent travels or surgeries.  He fell in the bathroom yesterday with squint to the right posterior thigh bruise.  No presyncope or syncope.  No head injuries.  No paresthesias or focal muscle weakness.  No fever or chills.  No dysuria, oliguria or hematuria or flank pain.  He denied any melena or bright red being per rectum.  No hemoptysis.  No other bleeding diathesis.  ED Course: When he came to the ER, BP was 179/104 with otherwise normal vital signs.  Labs revealed mild hypokalemia 3.3 and calcium of 8.7 with otherwise unremarkable CMP.  High sensitive troponin I was 23 and later 22.  CBC showed hemoglobin 12.9 hematocrit 41.1 close to previous levels. EKG as reviewed by me : Showed atrial fibrillation with slightly rapid ventricular sponsor of 101 with old Q waves inferiorly. Imaging: Portable chest x-ray showed cardiomegaly with no acute cardiopulmonary disease.  The  patient was given sublingual nitroglycerin and, 4 mg of IV morphine sulfate and 4 mg of IV Zofran as well as 40 mill equivalent p.o. potassium chloride.  He will be admitted to an observation cardiac telemetry bed for further evaluation and management. PAST MEDICAL HISTORY:   Past Medical History:  Diagnosis Date   A-fib    Anemia    Anginal pain    Anxiety    Arthritis    Asthma    CAD (coronary artery disease)    a. 2002 CABGx2 (LIMA->LAD, VG->VG->OM1);  b. 09/2012 DES->OM;  c. 03/2015 PTCA of LAD [facility]) in setting of atretic LIMA; d. 05/2015 Cath [facility]): nonobs dzs; e. 06/2015 Cath ([facility]): LM nl, LAD 45p/d ISR, 50d, D1/2 small, LCX 50p/d ISR, OM1 70ost, 30 ISR, VG->OM1 50ost, 46m, LIMA->LAD 99p/d - atretic, RCA dom, nl; f.cath 10/16: 40-50%(FFR 0.90) pLAD, 75% (FFR 0.77) mLAD s/p PCI/DES, oRCA 40% (FFR0.95)   Cancer    SKIN CANCER ON BACK   Celiac disease    Chronic diastolic CHF (congestive heart failure)    a. 06/2009 Echo: EF 60-65%, Gr 1 DD, triv AI, mildly dil LA, nl RV.   COPD (chronic obstructive pulmonary disease)    a. Chronic bronchitis and emphysema.   DDD (degenerative disc disease), lumbar    Diverticulosis    Dysrhythmia    Essential hypertension    GERD (gastroesophageal reflux disease)    History of hiatal hernia    History of kidney stones    H/O  History of tobacco abuse    a. Quit 2014.   Myocardial infarction 2002   4 STENTS   Pancreatitis    PSVT (paroxysmal supraventricular tachycardia)    a. 10/2012 Noted on Zio Patch.   Sleep apnea    LOST CORD TO CPAP -ONLY 02 @ BEDTIME   Tubular adenoma of colon    Type II diabetes mellitus     PAST SURGICAL HISTORY:   Past Surgical History:  Procedure Laterality Date   BYPASS GRAFT     CARDIAC CATHETERIZATION N/A 07/12/2015   rocedure: Left Heart Cath and Cors/Grafts Angiography;  Surgeon: Lyn RecordsHenry W Smith, MD;  [facility];  Service: Cardiovascular;  Laterality: N/A;   CARDIAC CATHETERIZATION  Right 10/07/2015   Procedure: Left Heart Cath and Cors/Grafts Angiography;  Surgeon: Laurier NancyShaukat A Khan, MD;  Location: [facility] INVASIVE CV LAB;  Service: Cardiovascular;  Laterality: Right;   CARDIAC CATHETERIZATION N/A 04/06/2016   Procedure: Left Heart Cath and Coronary Angiography;  Surgeon: Alwyn Peawayne D Callwood, MD;  Location: [facility] INVASIVE CV LAB;  Service: Cardiovascular;  Laterality: N/A;   CARDIAC CATHETERIZATION  04/06/2016   Procedure: Bypass Graft Angiography;  Surgeon: Alwyn Peawayne D Callwood, MD;  Location: [facility] INVASIVE CV LAB;  Service: Cardiovascular;;   CARDIAC CATHETERIZATION N/A 11/02/2016   Procedure: Left Heart Cath and Cors/Grafts Angiography and possible PCI;  Surgeon: Alwyn Peawayne D Callwood, MD;  Location: [facility] INVASIVE CV LAB;  Service: Cardiovascular;  Laterality: N/A;   CARDIAC CATHETERIZATION N/A 11/02/2016   Procedure: Coronary Stent Intervention;  Surgeon: Alwyn Peawayne D Callwood, MD;  Location: [facility] INVASIVE CV LAB;  Service: Cardiovascular;  Laterality: N/A;   CARDIOVERSION N/A 06/29/2022   Procedure: CARDIOVERSION;  Surgeon: Lamar BlinksKowalski, Bruce J, MD;  Location: [facility] ORS;  Service: Cardiovascular;  Laterality: N/A;   CHOLECYSTECTOMY     CIRCUMCISION N/A 06/09/2019   Procedure: CIRCUMCISION ADULT;  Surgeon: Sondra ComeSninsky, Brian C, MD;  Location: [facility] ORS;  Service: Urology;  Laterality: N/A;   COLONOSCOPY WITH PROPOFOL N/A 04/01/2018   Procedure: COLONOSCOPY WITH PROPOFOL;  Surgeon: Scot JunElliott, Robert T, MD;  [facility];  Service: Endoscopy;  Laterality: N/A;   ESOPHAGEAL DILATION     ESOPHAGOGASTRODUODENOSCOPY (EGD) WITH PROPOFOL N/A 04/01/2018   Procedure: ESOPHAGOGASTRODUODENOSCOPY (EGD) WITH PROPOFOL;  Surgeon: Scot JunElliott, Robert T, MD;  [facility];  Service: Endoscopy;  Laterality: N/A;   LEFT HEART CATH AND CORS/GRAFTS ANGIOGRAPHY N/A 06/12/2019   Procedure: LEFT HEART CATH AND CORS/GRAFTS ANGIOGRAPHY;  Surgeon: Dalia HeadingFath, Kenneth A, MD;  Location: [facility] INVASIVE CV LAB;  Service:  Cardiovascular;  Laterality: N/A;   LEFT HEART CATH AND CORS/GRAFTS ANGIOGRAPHY N/A 03/11/2020   Procedure: LEFT HEART CATH AND CORS/GRAFTS ANGIOGRAPHY;  Surgeon: Marcina MillardParaschos, Alexander, MD;  Location: [facility] INVASIVE CV LAB;  Service: Cardiovascular;  Laterality: N/A;   LEFT HEART CATH AND CORS/GRAFTS ANGIOGRAPHY N/A 05/01/2021   Procedure: LEFT HEART CATH AND CORS/GRAFTS ANGIOGRAPHY;  Surgeon: Lamar BlinksKowalski, Bruce J, MD;  Location: [facility] INVASIVE CV LAB;  Service: Cardiovascular;  Laterality: N/A;   LEFT HEART CATH AND CORS/GRAFTS ANGIOGRAPHY N/A 11/02/2022   Procedure: LEFT HEART CATH AND CORS/GRAFTS ANGIOGRAPHY;  Surgeon: Alwyn Peaallwood, Dwayne D, MD;  Location: [facility] INVASIVE CV LAB;  Service: Cardiovascular;  Laterality: N/A;   TONSILLECTOMY     VASCULAR SURGERY      SOCIAL HISTORY:   Social History   Tobacco Use   Smoking status: Former    Packs/day: 3.00    Years: 50.00    Additional pack years: 0.00  Total pack years: 150.00    Types: Cigarettes    Quit date: 04/22/2013    Years since quitting: 9.8   Smokeless tobacco: Never   Tobacco comments:    Reports not smoking for approx 8 years.  Substance Use Topics   Alcohol use: No    Comment: remotely quit alcohol use. Hx of heavy alcohol use.    FAMILY HISTORY:   Family History  Problem Relation Age of Onset   Heart attack Mother    Depression Mother    Heart disease Mother    COPD Mother    Hypertension Mother    Heart attack Father    Diabetes Father    Depression Father    Heart disease Father    Cirrhosis Father    Parkinson's disease Brother     DRUG ALLERGIES:   Allergies  Allergen Reactions   Prednisone Other (See Comments) and Hypertension    Pt states that this medication puts him in A-fib    Demerol  [Meperidine Hcl]    Demerol [Meperidine] Hives   Jardiance [Empagliflozin] Other (See Comments)    Perineal pain   Sulfa Antibiotics Hives   Albuterol Sulfate [Albuterol] Palpitations and Other (See Comments)     Pt currently uses this medication.     Morphine Sulfate Nausea And Vomiting, Rash and Other (See Comments)    Pt states that he is only allergic to the tablet form of this medication.      REVIEW OF SYSTEMS:   ROS As per history of present illness. All pertinent systems were reviewed above. Constitutional, HEENT, cardiovascular, respiratory, GI, GU, musculoskeletal, neuro, psychiatric, endocrine, integumentary and hematologic systems were reviewed and are otherwise negative/unremarkable except for positive findings mentioned above in the HPI.   MEDICATIONS AT HOME:   Prior to Admission medications   Medication Sig Start Date End Date Taking? Authorizing Provider  Accu-Chek Softclix Lancets lancets Use as instructed to check sugar daily for type 2 diabetes. 03/03/21  Yes Malva Limes, MD  acetaminophen (TYLENOL) 325 MG tablet Take 2 tablets (650 mg total) by mouth every 4 (four) hours as needed for headache or mild pain. 08/07/22  Yes Rolly Salter, MD  allopurinol (ZYLOPRIM) 300 MG tablet TAKE 1 TABLET BY MOUTH TWICE A DAY Patient taking differently: Take 300 mg by mouth 2 (two) times daily. 10/15/21  Yes Malva Limes, MD  ALPRAZolam Prudy Feeler) 1 MG tablet Take 1 tablet (1 mg total) by mouth 3 (three) times daily. 02/01/23  Yes Malva Limes, MD  amiodarone (PACERONE) 200 MG tablet Take 200 mg by mouth 2 (two) times daily.   Yes [provider]  aspirin 81 MG chewable tablet Chew 81 mg by mouth daily. Swallows whole   Yes [provider]  atorvastatin (LIPITOR) 80 MG tablet Take 1 tablet (80 mg total) by mouth daily. Please schedule office visit before any future refill. 11/19/22  Yes Malva Limes, MD  Blood Glucose Calibration (ACCU-CHEK GUIDE CONTROL) LIQD Use with blood glucose monitor as directed 02/20/21  Yes Malva Limes, MD  Blood Glucose Monitoring Suppl (ACCU-CHEK GUIDE) w/Device KIT Use to check blood sugars as directed 02/20/21  Yes Malva Limes, MD   Budeson-Glycopyrrol-Formoterol (BREZTRI AEROSPHERE) 160-9-4.8 MCG/ACT AERO Inhale 2 puffs into the lungs in the morning and at bedtime. 02/02/23  Yes [name], MD  celecoxib (CELEBREX) 200 MG capsule Take 1 capsule (200 mg total) by mouth 2 (two) times daily as needed. Home  med. 11/03/22  Yes [name], MD  cetirizine (ZYRTEC) 10 MG tablet TAKE 1 TABLET BY MOUTH AT BEDTIME 04/16/22  Yes [name], Demetrios Isaacs, MD  ELIQUIS 5 MG TABS tablet TAKE 1 TABLET BY MOUTH TWICE A DAY Patient taking differently: Take 5 mg by mouth 2 (two) times daily. 07/30/22  Yes [name], Demetrios Isaacs, MD  FARXIGA 10 MG TABS tablet TAKE 1 TABLET BY MOUTH DAILY Patient taking differently: Take 10 mg by mouth daily. TAKE 1 TABLET BY MOUTH DAILY 02/09/22  Yes Malva Limes, MD  glucose blood (ACCU-CHEK GUIDE) test strip Use as instructed to check sugar daily for type 2 diabetes. 03/03/21  Yes Malva Limes, MD  isosorbide mononitrate (IMDUR) 60 MG 24 hr tablet Take 1-2 tablets (60-120 mg total) by mouth daily. Home med. 11/03/22  Yes [name], MD  linaclotide [facility]) 72 MCG capsule Take 72 mcg by mouth daily before breakfast.   Yes [provider]  lisinopril (ZESTRIL) 10 MG tablet TAKE 1 TABLET BY MOUTH DAILY Patient taking differently: Take 10 mg by mouth daily. 07/30/22  Yes Malva Limes, MD  metFORMIN (GLUCOPHAGE-XR) 500 MG 24 hr tablet TAKE 1 TABLET BY MOUTH TWICE A DAY Patient taking differently: Take 500 mg by mouth 2 (two) times daily with a meal. 10/05/22  Yes [name], Demetrios Isaacs, MD  methocarbamol (ROBAXIN) 500 MG tablet Take 500 mg by mouth every 8 (eight) hours as needed for muscle spasms. Take 1 tablet by mouth every hour as needed for muscle spasms   Yes Malva Limes, MD  metroNIDAZOLE (METROGEL) 1 % gel Apply topically daily. 02/15/23  Yes Malva Limes, MD  naloxone [facility]) nasal spray 4 mg/0.1 mL 1 spray into nostril x1 and may repeat every 2-3 minutes until patient is responsive or EMS arrives  08/21/20  Yes Malva Limes, MD  nitroGLYCERIN (NITROSTAT) 0.4 MG SL tablet Place 1 tablet (0.4 mg total) under the tongue every 5 (five) minutes as needed for chest pain. 09/10/22  Yes Drubel, Lillia Abed, PA-C  omega-3 acid ethyl esters (LOVAZA) 1 g capsule Take 4 capsules (4 g total) by mouth daily. 09/10/22  Yes Drubel, Lillia Abed, PA-C  omeprazole (PRILOSEC) 40 MG capsule TAKE 1 CAPSULE BY MOUTH 2 TIMES DAILY 30 MINUTES BEFORE BREAKFAST AND DINNER 07/15/22  Yes Malva Limes, MD  oxyCODONE-acetaminophen (PERCOCET) 10-325 MG tablet Take 1 tablet by mouth every 4 (four) hours as needed. for pain 02/01/23  Yes Malva Limes, MD  Suvorexant (BELSOMRA) 10 MG TABS Take 1 tablet (10 mg total) by mouth at bedtime as needed. 02/15/23  Yes Malva Limes, MD  torsemide (DEMADEX) 100 MG tablet Take 0.5 tablets (50 mg total) by mouth daily. 09/10/22  Yes Alfredia Ferguson, PA-C  traZODone (DESYREL) 150 MG tablet TAKE 1 TABLET BY MOUTH AT BEDTIME 11/24/22  Yes Malva Limes, MD  venlafaxine XR (EFFEXOR-XR) 75 MG 24 hr capsule TAKE 1 CAPSULE BY MOUTH DAILY WITH BREAKFAST 12/16/21  Yes Malva Limes, MD  albuterol (VENTOLIN HFA) 108 (90 Base) MCG/ACT inhaler INHALE 2 PUFFS BY MOUTH EVERY 6 HOURS AS NEEDED FOR SHORTNESS OF BREATH Patient not taking: Reported on 02/15/2023 12/02/21   Malva Limes, MD  BELSOMRA 5 MG TABS TAKE 1 TABLET (5 MG TOTAL) BY MOUTH AT BEDTIME AS NEEDED Patient not taking: Reported on 02/22/2023 06/17/22   Malva Limes, MD  meclizine (ANTIVERT) 25 MG tablet TAKE ONE TABLET BY THREE TIMES A DAY AS NEEDED  FOR DIZZINESS Patient not taking: Reported on 02/22/2023 08/13/22   Malva Limes, MD      VITAL SIGNS:  Blood pressure (!) 152/92, pulse 75, temperature 98.1 F (36.7 C), temperature source Oral, resp. rate 13, height 5\' 7"  (1.702 m), weight 87.5 kg, SpO2 93 %.  PHYSICAL EXAMINATION:  Physical Exam  GENERAL:  67 y.o.-year-old Asian male patient lying in the bed with no acute  distress.  EYES: Pupils equal, round, reactive to light and accommodation. No scleral icterus. Extraocular muscles intact.  HEENT: Head atraumatic, normocephalic. Oropharynx and nasopharynx clear.  NECK:  Supple, no jugular venous distention. No thyroid enlargement, no tenderness.  LUNGS: Normal breath sounds bilaterally, no wheezing, rales,rhonchi or crepitation. No use of accessory muscles of respiration.  CARDIOVASCULAR: Irregularly irregular rhythm, S1, S2 normal. No murmurs, rubs, or gallops.  ABDOMEN: Soft, nondistended, nontender. Bowel sounds present. No organomegaly or mass.  EXTREMITIES: No pedal edema, cyanosis, or clubbing.  NEUROLOGIC: Cranial nerves II through XII are intact. Muscle strength 5/5 in all extremities. Sensation intact. Gait not checked.  PSYCHIATRIC: The patient is alert and oriented x 3.  Normal affect and good eye contact. SKIN: No obvious rash, lesion, or ulcer.   LABORATORY PANEL:   CBC Recent Labs  Lab 02/21/23 2304  WBC 10.2  HGB 12.9*  HCT 41.1  PLT 241   ------------------------------------------------------------------------------------------------------------------  Chemistries  Recent Labs  Lab 02/17/23 0025 02/21/23 2304  NA 141 138  K 3.7 3.3*  CL 104 104  CO2 28 26  GLUCOSE 103* 136*  BUN 15 19  CREATININE 0.87 0.92  CALCIUM 9.0 8.7*  MG 1.8  --   AST  --  14*  ALT  --  12  ALKPHOS  --  67  BILITOT  --  0.9   ------------------------------------------------------------------------------------------------------------------  Cardiac Enzymes No results for input(s): "TROPONINI" in the last 168 hours. ------------------------------------------------------------------------------------------------------------------  RADIOLOGY:  DG Chest 2 View  Result Date: 02/21/2023 CLINICAL DATA:  Chest pain EXAM: CHEST - 2 VIEW COMPARISON:  Chest x-ray 02/16/2023 FINDINGS: The heart is enlarged. Median sternotomy wires and mediastinal clips  are present. There is no focal lung consolidation, pleural effusion or pneumothorax. There is minimal bibasilar atelectasis. No acute fractures are seen. IMPRESSION: 1. No active cardiopulmonary disease. 2. Cardiomegaly. Electronically Signed   By: Darliss Cheney M.D.   On: 02/21/2023 23:26      IMPRESSION AND PLAN:  Assessment and Plan: * Chest pain - The patient will be admitted to an observation cardiac telemetry bed. - The patient is at high risk for cardiac etiology - Will follow serial troponins and EKGs. - The patient will be placed on aspirin as well as p.r.n. sublingual nitroglycerin and morphine sulfate for pain. - We will obtain a cardiology consult in a.m. for further cardiac risk stratification. - I notified Dr. Juliann Pares about the patient   Paroxysmal atrial fibrillation with RVR - We will continue amiodarone and Eliquis.  Type 2 diabetes mellitus without complications - The patient will be placed on supplement coverage with NovoLog. - We will continue Farxiga and hold off metformin.  Gout - We will continue allopurinol.  Dyslipidemia - We will continue statin therapy and Lovaza.  Hypertensive urgency - We will continue his antihypertensives. -He will be placed on as needed IV labetalol.   DVT prophylaxis: Eliquis Advanced Care Planning:  Code Status: full code. Family Communication:  The plan of care was discussed in details with the patient (and family). I answered all  questions. The patient agreed to proceed with the above mentioned plan. Further management will depend upon hospital course. Disposition Plan: Back to previous home environment Consults called: Cardiology. All the records are reviewed and case discussed with ED provider.  Status is: Observation  I certify that at the time of admission, it is my clinical judgment that the patient will require  hospital care extending more than 2 midnights.                            Dispo: The patient is from:  Home              Anticipated d/c is to: Home              Patient currently is not medically stable to d/c.              Difficult to place patient: No  Hannah Beat M.D on 02/22/2023 at 4:59 AM  [facility]   From 7 PM-7 AM, contact night-coverage [email or web address]  CC: Primary care physician; Malva Limes, MD

## 2023-02-22 NOTE — Assessment & Plan Note (Addendum)
-   We will continue his antihypertensives. - He will be placed on as needed IV labetalol. 

## 2023-02-22 NOTE — Discharge Summary (Signed)
Physician Discharge Summary   Patient: Lawrence [name] MRN: [id number] DOB: [birth year]  Admit date:     02/21/2023  Discharge date: 02/22/23  Discharge Physician: Arnetha Courser   PCP: Malva Limes, MD   Recommendations at discharge:  Please obtain CBC and BMP in 1 week Follow-up with cardiology Follow-up with primary care provider  Discharge Diagnoses: Principal Problem:   Chest pain Active Problems:   Paroxysmal atrial fibrillation with RVR   Type 2 diabetes mellitus without complications   Hypertensive urgency   Dyslipidemia   Gout   Hospital Course: Taken from H&P.  Lawrence [name] is a 67 y.o. Caucasian male with medical history significant for multiple medical problems that are mentioned below including coronary artery disease status post CABG, status post PCI and stents as well as asthma, celiac disease, chronic diastolic CHF, essential hypertension and type 2 diabetes mellitus, who presented to the ER with acute onset of midsternal chest pain that around 10 PM that is graded 10/10 in severity and felt as an elephant sitting on his chest with some sharp pain as well with associated nausea without vomiting and with associated cold diaphoresis as well as dyspnea and palpitations.  Patient did fall in the bathroom a day prior with right posterior thigh bruise, no other injuries.  ED course.  On arrival blood pressure elevated at 179/104, labs pertinent for hypokalemia at 3.3.  Troponin 23>> 22.  Rest stable.  EKG  Showed atrial fibrillation with slightly rapid ventricular sponsor of 101 with old Q waves inferiorly. Imaging: Portable chest x-ray showed cardiomegaly with no acute cardiopulmonary disease.  The patient was given sublingual nitroglycerin and, 4 mg of IV morphine sulfate and 4 mg of IV Zofran as well as 40 mill equivalent p.o. potassium chloride.   4/8; vitals stable.  Continue to have chest pain-ordered 1 dose of Toradol.  Patient has reproducible chest pain  which appears to be musculoskeletal in origin.  Lipase was checked and it was normal.  Troponin negative.  Cardiology saw him and cleared him for discharge.  They added Ranexa and will follow-up as outpatient.  Apparently multiple ED visits for similar reasons when he was asking for morphine.  Patient will continue on current medications and need to have a close follow-up with his providers for further recommendations.     Consultants: Cardiology Procedures performed: None Disposition: Home Diet recommendation:  Discharge Diet Orders (From admission, onward)     Start     Ordered   02/22/23 0000  Diet - low sodium heart healthy        02/22/23 1343           Cardiac and Carb modified diet DISCHARGE MEDICATION: Allergies as of 02/22/2023       Reactions   Prednisone Other (See Comments), Hypertension   Pt states that this medication puts him in A-fib    Demerol  [meperidine Hcl]    Demerol [meperidine] Hives   Jardiance [empagliflozin] Other (See Comments)   Perineal pain   Sulfa Antibiotics Hives   Albuterol Sulfate [albuterol] Palpitations, Other (See Comments)   Pt currently uses this medication.     Morphine Sulfate Nausea And Vomiting, Rash, Other (See Comments)   Pt states that he is only allergic to the tablet form of this medication.          Medication List     STOP taking these medications    predniSONE 10 MG tablet Commonly known as: DELTASONE  TAKE these medications    Accu-Chek Guide Control Liqd Use with blood glucose monitor as directed   Accu-Chek Guide test strip Generic drug: glucose blood Use as instructed to check sugar daily for type 2 diabetes.   Accu-Chek Guide w/Device Kit Use to check blood sugars as directed   Accu-Chek Softclix Lancets lancets Use as instructed to check sugar daily for type 2 diabetes.   acetaminophen 325 MG tablet Commonly known as: TYLENOL Take 2 tablets (650 mg total) by mouth every 4 (four) hours  as needed for headache or mild pain.   albuterol 108 (90 Base) MCG/ACT inhaler Commonly known as: VENTOLIN HFA INHALE 2 PUFFS BY MOUTH EVERY 6 HOURS AS NEEDED FOR SHORTNESS OF BREATH   allopurinol 300 MG tablet Commonly known as: ZYLOPRIM TAKE 1 TABLET BY MOUTH TWICE A DAY   ALPRAZolam 1 MG tablet Commonly known as: XANAX Take 1 tablet (1 mg total) by mouth 3 (three) times daily.   amiodarone 200 MG tablet Commonly known as: PACERONE Take 200 mg by mouth 2 (two) times daily.   aspirin 81 MG chewable tablet Chew 81 mg by mouth daily. Swallows whole   atorvastatin 80 MG tablet Commonly known as: LIPITOR Take 1 tablet (80 mg total) by mouth daily. Please schedule office visit before any future refill.   Belsomra 10 MG Tabs Generic drug: Suvorexant Take 1 tablet (10 mg total) by mouth at bedtime as needed. What changed: Another medication with the same name was removed. Continue taking this medication, and follow the directions you see here.   Breztri Aerosphere 160-9-4.8 MCG/ACT Aero Generic drug: Budeson-Glycopyrrol-Formoterol Inhale 2 puffs into the lungs in the morning and at bedtime.   celecoxib 200 MG capsule Commonly known as: CELEBREX Take 1 capsule (200 mg total) by mouth 2 (two) times daily as needed. Home med.   cetirizine 10 MG tablet Commonly known as: ZYRTEC TAKE 1 TABLET BY MOUTH AT BEDTIME   Eliquis 5 MG Tabs tablet Generic drug: apixaban TAKE 1 TABLET BY MOUTH TWICE A DAY What changed: how much to take   Farxiga 10 MG Tabs tablet Generic drug: dapagliflozin propanediol TAKE 1 TABLET BY MOUTH DAILY What changed:  how much to take additional instructions   isosorbide mononitrate 60 MG 24 hr tablet Commonly known as: IMDUR Take 1-2 tablets (60-120 mg total) by mouth daily. Home med.   linaclotide 72 MCG capsule Commonly known as: LINZESS Take 72 mcg by mouth daily before breakfast.   lisinopril 10 MG tablet Commonly known as: ZESTRIL TAKE 1  TABLET BY MOUTH DAILY   meclizine 25 MG tablet Commonly known as: ANTIVERT TAKE ONE TABLET BY THREE TIMES A DAY AS NEEDED FOR DIZZINESS   metFORMIN 500 MG 24 hr tablet Commonly known as: GLUCOPHAGE-XR TAKE 1 TABLET BY MOUTH TWICE A DAY What changed: when to take this   methocarbamol 500 MG tablet Commonly known as: ROBAXIN Take 500 mg by mouth every 8 (eight) hours as needed for muscle spasms. Take 1 tablet by mouth every hour as needed for muscle spasms   metroNIDAZOLE 1 % gel Commonly known as: METROGEL Apply topically daily.   naloxone 4 MG/0.1ML Liqd nasal spray kit Commonly known as: NARCAN 1 spray into nostril x1 and may repeat every 2-3 minutes until patient is responsive or EMS arrives   nitroGLYCERIN 0.4 MG SL tablet Commonly known as: NITROSTAT Place 1 tablet (0.4 mg total) under the tongue every 5 (five) minutes as needed for chest pain.  omega-3 acid ethyl esters 1 g capsule Commonly known as: LOVAZA Take 4 capsules (4 g total) by mouth daily.   omeprazole 40 MG capsule Commonly known as: PRILOSEC TAKE 1 CAPSULE BY MOUTH 2 TIMES DAILY 30 MINUTES BEFORE BREAKFAST AND DINNER   oxyCODONE-acetaminophen 10-325 MG tablet Commonly known as: PERCOCET Take 1 tablet by mouth every 4 (four) hours as needed. for pain   ranolazine 1000 MG SR tablet Commonly known as: RANEXA Take 1 tablet (1,000 mg total) by mouth 2 (two) times daily.   torsemide 100 MG tablet Commonly known as: DEMADEX Take 0.5 tablets (50 mg total) by mouth daily.   traZODone 150 MG tablet Commonly known as: DESYREL TAKE 1 TABLET BY MOUTH AT BEDTIME   venlafaxine XR 75 MG 24 hr capsule Commonly known as: EFFEXOR-XR TAKE 1 CAPSULE BY MOUTH DAILY WITH BREAKFAST        Follow-up Information     Callwood, Gerda Diss D, MD. Go in 1 week(s).   Specialties: Cardiology, Internal Medicine Contact information: [address] [place] [address] [phone]         Malva Limes,  MD. Schedule an appointment as soon as possible for a visit in 1 week(s).   Specialty: Family Medicine Contact information: [address] [place] [address] [phone]                Discharge Exam: Ceasar Mons Weights   02/21/23 2305  Weight: 87.5 kg   General.  Well-developed gentleman, in no acute distress. Pulmonary.  Lungs clear bilaterally, normal respiratory effort. CV.  Regular rate and rhythm, no JVD, rub or murmur. Abdomen.  Soft, nontender, nondistended, BS positive. CNS.  Alert and oriented .  No focal neurologic deficit. Extremities.  No edema, no cyanosis, pulses intact and symmetrical. Psychiatry.  Judgment and insight appears normal.   Condition at discharge: stable  The results of significant diagnostics from this hospitalization (including imaging, microbiology, ancillary and laboratory) are listed below for reference.   Imaging Studies: DG Chest 2 View  Result Date: 02/21/2023 CLINICAL DATA:  Chest pain EXAM: CHEST - 2 VIEW COMPARISON:  Chest x-ray 02/16/2023 FINDINGS: The heart is enlarged. Median sternotomy wires and mediastinal clips are present. There is no focal lung consolidation, pleural effusion or pneumothorax. There is minimal bibasilar atelectasis. No acute fractures are seen. IMPRESSION: 1. No active cardiopulmonary disease. 2. Cardiomegaly. Electronically Signed   By: Darliss Cheney M.D.   On: 02/21/2023 23:26   DG Chest Portable 1 View  Result Date: 02/16/2023 CLINICAL DATA:  Bilateral leg pain. EXAM: PORTABLE CHEST 1 VIEW COMPARISON:  February 09, 2023 FINDINGS: Multiple sternal wires and vascular clips are noted. The heart size and mediastinal contours are within normal limits. A coronary artery stent is seen. There is marked severity calcification of the aortic arch. Chronic appearing increased lung markings are seen with mild atelectasis noted within the bilateral lung bases. Multilevel degenerative changes are seen throughout the  thoracic spine. IMPRESSION: 1. Evidence of prior median sternotomy/CABG. 2. Mild bibasilar atelectasis. Electronically Signed   By: Aram Candela M.D.   On: 02/16/2023 21:45   DG Chest Portable 1 View  Result Date: 02/09/2023 CLINICAL DATA:  Chest pain. EXAM: PORTABLE CHEST 1 VIEW COMPARISON:  February 06, 2023. FINDINGS: Stable cardiomediastinal silhouette. Sternotomy wires are noted. Minimal bibasilar subsegmental atelectasis is noted. Bony thorax is unremarkable. IMPRESSION: Minimal bibasilar subsegmental atelectasis. Electronically Signed   By: Lupita Raider M.D.   On: 02/09/2023  21:14   DG Chest 2 View  Result Date: 02/06/2023 CLINICAL DATA:  Right-sided chest pain. EXAM: CHEST - 2 VIEW COMPARISON:  Chest radiograph dated 01/27/2023. FINDINGS: No focal consolidation, pleural effusion, or pneumothorax. There is mild cardiomegaly with mild central vascular congestion. Median sternotomy wires and CABG vascular clips. No acute osseous pathology. IMPRESSION: Mild cardiomegaly with mild central vascular congestion. No focal consolidation. Electronically Signed   By: Elgie Collard M.D.   On: 02/06/2023 21:31   CT HEAD WO CONTRAST ( )  Result Date: 02/04/2023 CLINICAL DATA:  Trauma. EXAM: CT HEAD WITHOUT CONTRAST CT CERVICAL SPINE WITHOUT CONTRAST TECHNIQUE: Multidetector CT imaging of the head and cervical spine was performed following the standard protocol without intravenous contrast. Multiplanar CT image reconstructions of the cervical spine were also generated. RADIATION DOSE REDUCTION: This exam was performed according to the departmental dose-optimization program which includes automated exposure control, adjustment of the mA and/or kV according to patient size and/or use of iterative reconstruction technique. COMPARISON:  Cervical spine CT dated 01/18/2023. Head CT dated 01/27/2023. FINDINGS: CT HEAD FINDINGS Brain: The ventricles and sulci are appropriate size for the patient's age. The  gray-white matter discrimination is preserved. There is no acute intracranial hemorrhage. No mass effect or midline shift. No extra-axial fluid collection. Vascular: No hyperdense vessel or unexpected calcification. Skull: Normal. Negative for fracture or focal lesion. Sinuses/Orbits: Diffuse mucoperiosteal thickening of paranasal sinuses. No air-fluid level. The mastoid air cells are clear. Other: None CT CERVICAL SPINE FINDINGS Alignment: No acute subluxation. Skull base and vertebrae: No acute fracture.  Osteopenia. Soft tissues and spinal canal: No prevertebral fluid or swelling. No visible canal hematoma. Disc levels:  No acute findings. Upper chest: Negative. Other: Bilateral carotid bulb calcified plaques. IMPRESSION: 1. No acute intracranial pathology. 2. No acute/traumatic cervical spine pathology. 3. Chronic paranasal sinus disease. Electronically Signed   By: Elgie Collard M.D.   On: 02/04/2023 20:43   CT Cervical Spine Wo Contrast  Result Date: 02/04/2023 CLINICAL DATA:  Trauma. EXAM: CT HEAD WITHOUT CONTRAST CT CERVICAL SPINE WITHOUT CONTRAST TECHNIQUE: Multidetector CT imaging of the head and cervical spine was performed following the standard protocol without intravenous contrast. Multiplanar CT image reconstructions of the cervical spine were also generated. RADIATION DOSE REDUCTION: This exam was performed according to the departmental dose-optimization program which includes automated exposure control, adjustment of the mA and/or kV according to patient size and/or use of iterative reconstruction technique. COMPARISON:  Cervical spine CT dated 01/18/2023. Head CT dated 01/27/2023. FINDINGS: CT HEAD FINDINGS Brain: The ventricles and sulci are appropriate size for the patient's age. The gray-white matter discrimination is preserved. There is no acute intracranial hemorrhage. No mass effect or midline shift. No extra-axial fluid collection. Vascular: No hyperdense vessel or unexpected  calcification. Skull: Normal. Negative for fracture or focal lesion. Sinuses/Orbits: Diffuse mucoperiosteal thickening of paranasal sinuses. No air-fluid level. The mastoid air cells are clear. Other: None CT CERVICAL SPINE FINDINGS Alignment: No acute subluxation. Skull base and vertebrae: No acute fracture.  Osteopenia. Soft tissues and spinal canal: No prevertebral fluid or swelling. No visible canal hematoma. Disc levels:  No acute findings. Upper chest: Negative. Other: Bilateral carotid bulb calcified plaques. IMPRESSION: 1. No acute intracranial pathology. 2. No acute/traumatic cervical spine pathology. 3. Chronic paranasal sinus disease. Electronically Signed   By: Elgie Collard M.D.   On: 02/04/2023 20:43   DG Lumbar Spine Complete  Result Date: 02/04/2023 CLINICAL DATA:  Low back pain and left knee pain.  Fall injury. EXAM: LUMBAR SPINE - COMPLETE 4+ VIEW; LEFT KNEE - COMPLETE 4+ VIEW COMPARISON:  Recent lumbar spine series 01/18/2023, left knee series 06/03/2015 FINDINGS: Lumbar routine five view series: There previously was thought to be a cortical step-off anteriorly at S4 and at the junction of S5 and the coccyx but no cortical step-off is seen on today's images. Five lumbar segments are again noted. There is osteopenia. There is preservation of the normal vertebral heights apart from mild chronic anterior wedging of the T11 and 12 vertebral bodies and slight wedging of L1. No acute fracture is evident. Grade 1 degenerative anterolisthesis is again noted at L4-5 with otherwise normal alignment. Degenerative disc height loss is again noted at T11-12 and T12-L1. In the lumbar spine there is preservation of the disc heights. Facet joint hypertrophy is seen from L2-3 down greatest at L4-5. Superiorly ankylosed SI joints are again shown. Moderate to heavy aortoiliac calcific plaque. There are cholecystectomy clips. Left knee, routine 4 views: Moderate to heavy patchy calcification again noted in the  right femoral artery. There are surgical clips in the dorsomedial soft tissues. No significant suprapatellar bursal effusion. Joint spaces are maintained. There is trace marginal spurring at the patellofemoral joint, small enthesopathic spurring of the anterior superior patella. There is no evidence of fracture or dislocation.  Mild osteopenia. IMPRESSION: 1. Lumbar osteopenia and degenerative changes without evidence of acute fracture or acute malalignment. 2. Chronic anterior wedging of T11 and T12 and slight wedging of L1. Chronic degenerative grade 1 L4-5 spondylolisthesis. 3. Aortic atherosclerosis. 4. Trace marginal spurring of the patellofemoral joint. No evidence of fracture or dislocation of the left knee. 5. Atherosclerosis of the right femoral artery. Electronically Signed   By: Almira Bar M.D.   On: 02/04/2023 20:27   DG Knee Complete 4 Views Left  Result Date: 02/04/2023 CLINICAL DATA:  Low back pain and left knee pain.  Fall injury. EXAM: LUMBAR SPINE - COMPLETE 4+ VIEW; LEFT KNEE - COMPLETE 4+ VIEW COMPARISON:  Recent lumbar spine series 01/18/2023, left knee series 06/03/2015 FINDINGS: Lumbar routine five view series: There previously was thought to be a cortical step-off anteriorly at S4 and at the junction of S5 and the coccyx but no cortical step-off is seen on today's images. Five lumbar segments are again noted. There is osteopenia. There is preservation of the normal vertebral heights apart from mild chronic anterior wedging of the T11 and 12 vertebral bodies and slight wedging of L1. No acute fracture is evident. Grade 1 degenerative anterolisthesis is again noted at L4-5 with otherwise normal alignment. Degenerative disc height loss is again noted at T11-12 and T12-L1. In the lumbar spine there is preservation of the disc heights. Facet joint hypertrophy is seen from L2-3 down greatest at L4-5. Superiorly ankylosed SI joints are again shown. Moderate to heavy aortoiliac calcific  plaque. There are cholecystectomy clips. Left knee, routine 4 views: Moderate to heavy patchy calcification again noted in the right femoral artery. There are surgical clips in the dorsomedial soft tissues. No significant suprapatellar bursal effusion. Joint spaces are maintained. There is trace marginal spurring at the patellofemoral joint, small enthesopathic spurring of the anterior superior patella. There is no evidence of fracture or dislocation.  Mild osteopenia. IMPRESSION: 1. Lumbar osteopenia and degenerative changes without evidence of acute fracture or acute malalignment. 2. Chronic anterior wedging of T11 and T12 and slight wedging of L1. Chronic degenerative grade 1 L4-5 spondylolisthesis. 3. Aortic atherosclerosis. 4. Trace marginal spurring of the  patellofemoral joint. No evidence of fracture or dislocation of the left knee. 5. Atherosclerosis of the right femoral artery. Electronically Signed   By: Almira Bar M.D.   On: 02/04/2023 20:27   US Venous Img Lower Unilateral Left  Result Date: 01/29/2023 CLINICAL DATA:  Left leg pain EXAM: Left LOWER EXTREMITY VENOUS DOPPLER ULTRASOUND TECHNIQUE: Gray-scale sonography with compression, as well as color and duplex ultrasound, were performed to evaluate the deep venous system(s) from the level of the common femoral vein through the popliteal and proximal calf veins. COMPARISON:  None Available. FINDINGS: VENOUS Normal compressibility of the common femoral, superficial femoral, and popliteal veins, as well as the visualized calf veins. Visualized portions of profunda femoral vein and great saphenous vein unremarkable. No filling defects to suggest DVT on grayscale or color Doppler imaging. Doppler waveforms show normal direction of venous flow, normal respiratory plasticity and response to augmentation. Limited views of the contralateral common femoral vein are unremarkable. OTHER None. Limitations: none IMPRESSION: Negative. Electronically Signed    By: Minerva Fester M.D.   On: 01/29/2023 01:42   CT Angio Chest/Abd/Pel for Dissection W and/or Wo Contrast  Result Date: 01/27/2023 CLINICAL DATA:  Chest pain, headache, and dizziness. Symptoms for 1 hour EXAM: CT ANGIOGRAPHY CHEST, ABDOMEN AND PELVIS TECHNIQUE: Non-contrast CT of the chest was initially obtained. Multidetector CT imaging through the chest, abdomen and pelvis was performed using the standard protocol during bolus administration of intravenous contrast. Multiplanar reconstructed images and MIPs were obtained and reviewed to evaluate the vascular anatomy. RADIATION DOSE REDUCTION: This exam was performed according to the departmental dose-optimization program which includes automated exposure control, adjustment of the mA and/or kV according to patient size and/or use of iterative reconstruction technique. CONTRAST:  OMNIPAQUE IOHEXOL 350 MG/ML SOLN COMPARISON:  10/07/2020 chest CT. FINDINGS: CTA CHEST FINDINGS Cardiovascular: Preferential opacification of the thoracic aorta. No evidence of thoracic aortic aneurysm or dissection. Normal heart size. No pericardial effusion. Atheromatous calcification of the aorta and coronaries that is extensive. There has been CABG. LIMA enhancement is suboptimally visualized, a venous graft extending towards the left is well enhancing proximally. Mediastinum/Nodes: Negative for mass or adenopathy. Unremarkable esophagus. Lungs/Pleura: There is no edema, consolidation, effusion, or pneumothorax. 7 x 4 mm pulmonary nodule in the right middle lobe on 6:96. This is new/larger than seen in 2021. Musculoskeletal: No acute or aggressive finding Review of the MIP images confirms the above findings. CTA ABDOMEN AND PELVIS FINDINGS VASCULAR Aorta: Atheromatous wall thickening.  No dissection or aneurysm Celiac: Vessels are smooth and widely patent. SMA: Vessels are smooth and widely patent Renals: Vessels are smooth and widely patent IMA: Widely patent Inflow:  Diffuse atheromatous plaque. Veins: Unremarkable Review of the MIP images confirms the above findings. NON-VASCULAR Hepatobiliary: No focal liver abnormality.No evidence of biliary obstruction or stone. Pancreas: Unremarkable. Spleen: Unremarkable. Adrenals/Urinary Tract: Negative adrenals. No hydronephrosis or stone. Unremarkable bladder. Stomach/Bowel:  No obstruction. Colonic diverticulosis. Lymphatic: No mass or adenopathy. Reproductive:No pathologic findings. Other: No ascites or pneumoperitoneum. Musculoskeletal: No acute abnormalities. Review of the MIP images confirms the above findings. IMPRESSION: 1. No acute finding including signs of acute aortic syndrome. 2. 6 mm solid nodule in the right middle lobe. Non-contrast chest CT at 6-12 months is recommended. If the nodule is stable at time of repeat CT, then future CT at 18-24 months (from today's scan) is considered optional for low-risk patients, but is recommended for high-risk patients. This recommendation follows the consensus statement: Guidelines for Management  of Incidental Pulmonary Nodules Detected on CT Images: From the Fleischner Society 2017; Radiology 2017; 284:228-243. 3.  Aortic Atherosclerosis (ICD10-I70.0). Electronically Signed   By: Tiburcio Pea M.D.   On: 01/27/2023 04:29   CT Head Wo Contrast  Result Date: 01/27/2023 CLINICAL DATA:  Head trauma EXAM: CT HEAD WITHOUT CONTRAST TECHNIQUE: Contiguous axial images were obtained from the base of the skull through the vertex without intravenous contrast. RADIATION DOSE REDUCTION: This exam was performed according to the departmental dose-optimization program which includes automated exposure control, adjustment of the mA and/or kV according to patient size and/or use of iterative reconstruction technique. COMPARISON:  CT brain 01/18/2023 FINDINGS: Brain: No acute territorial infarction, hemorrhage, or intracranial mass. Mild atrophy. Stable ventricle size. Vascular: No hyperdense  vessels.  No unexpected calcification Skull: Normal. Negative for fracture or focal lesion. Sinuses/Orbits: Mucosal thickening in the sinuses Other: None IMPRESSION: No CT evidence for acute intracranial abnormality. Mild atrophy. Electronically Signed   By: Jasmine Pang M.D.   On: 01/27/2023 02:01   DG Chest Port 1 View  Result Date: 01/27/2023 CLINICAL DATA:  Chest pain short of breath EXAM: PORTABLE CHEST 1 VIEW COMPARISON:  01/13/2023 FINDINGS: Post sternotomy changes. Cardiomegaly with mild central congestion. No acute airspace disease, pleural effusion or pneumothorax. Aortic atherosclerosis IMPRESSION: Cardiomegaly with mild central congestion. Electronically Signed   By: Jasmine Pang M.D.   On: 01/27/2023 01:12    Microbiology: Results for orders placed or performed during the hospital encounter of 12/26/22  MRSA Next Gen by PCR, Nasal     Status: None   Collection Time: 12/28/22 12:59 AM   Specimen: Nasal Mucosa; Nasal Swab  Result Value Ref Range Status   MRSA by PCR Next Gen NOT DETECTED NOT DETECTED Final    Comment: (NOTE) The GeneXpert MRSA Assay (FDA approved for NASAL specimens only), is one component of a comprehensive MRSA colonization surveillance program. It is not intended to diagnose MRSA infection nor to guide or monitor treatment for MRSA infections. Test performance is not FDA approved in patients less than 25 years old. Performed at [facility], [address] [place], [place] [address]    *Note: Due to a large number of results and/or encounters for the requested time period, some results have not been displayed. A complete set of results can be found in Results Review.    Labs: CBC: Recent Labs  Lab 02/17/23 0025 02/21/23 2304 02/22/23 1234  WBC 7.2 10.2 7.4  NEUTROABS 5.1 8.1*  --   HGB 12.3* 12.9* 11.6*  HCT 39.0 41.1 37.8*  MCV 90.3 88.8 91.3  PLT 191 241 201   Basic Metabolic Panel: Recent Labs  Lab 02/17/23 0025 02/21/23 2304  02/22/23 1234  NA 141 138 138  K 3.7 3.3* 3.8  CL 104 104 107  CO2 28 26 27   GLUCOSE 103* 136* 126*  BUN 15 19 22   CREATININE 0.87 0.92 0.95  CALCIUM 9.0 8.7* 8.6*  MG 1.8  --   --    Liver Function Tests: Recent Labs  Lab 02/21/23 2304  AST 14*  ALT 12  ALKPHOS 67  BILITOT 0.9  PROT 6.5  ALBUMIN 3.5   CBG: Recent Labs  Lab 02/22/23 0739 02/22/23 1317  GLUCAP 118* 128*    Discharge time spent: greater than 30 minutes.  This record has been created using Conservation officer, historic buildings. Errors have been sought and corrected,but may not always be located. Such creation errors do not reflect on  the standard of care.   Signed: Arnetha CourserSumayya Azalynn Maxim, MD [facility] 02/22/2023

## 2023-02-23 ENCOUNTER — Other Ambulatory Visit: Payer: Self-pay | Admitting: Family Medicine

## 2023-02-23 DIAGNOSIS — E1122 Type 2 diabetes mellitus with diabetic chronic kidney disease: Secondary | ICD-10-CM | POA: Diagnosis not present

## 2023-02-23 DIAGNOSIS — I700 Atherosclerosis of aorta: Secondary | ICD-10-CM | POA: Diagnosis not present

## 2023-02-23 DIAGNOSIS — I482 Chronic atrial fibrillation, unspecified: Secondary | ICD-10-CM | POA: Diagnosis not present

## 2023-02-23 DIAGNOSIS — D631 Anemia in chronic kidney disease: Secondary | ICD-10-CM | POA: Diagnosis not present

## 2023-02-23 DIAGNOSIS — I130 Hypertensive heart and chronic kidney disease with heart failure and stage 1 through stage 4 chronic kidney disease, or unspecified chronic kidney disease: Secondary | ICD-10-CM | POA: Diagnosis not present

## 2023-02-23 DIAGNOSIS — I5023 Acute on chronic systolic (congestive) heart failure: Secondary | ICD-10-CM | POA: Diagnosis not present

## 2023-02-23 DIAGNOSIS — F419 Anxiety disorder, unspecified: Secondary | ICD-10-CM

## 2023-02-23 DIAGNOSIS — N182 Chronic kidney disease, stage 2 (mild): Secondary | ICD-10-CM | POA: Diagnosis not present

## 2023-02-23 DIAGNOSIS — J4489 Other specified chronic obstructive pulmonary disease: Secondary | ICD-10-CM | POA: Diagnosis not present

## 2023-02-23 DIAGNOSIS — I5032 Chronic diastolic (congestive) heart failure: Secondary | ICD-10-CM | POA: Diagnosis not present

## 2023-02-24 ENCOUNTER — Telehealth: Payer: Self-pay

## 2023-02-24 DIAGNOSIS — N182 Chronic kidney disease, stage 2 (mild): Secondary | ICD-10-CM | POA: Diagnosis not present

## 2023-02-24 DIAGNOSIS — I5023 Acute on chronic systolic (congestive) heart failure: Secondary | ICD-10-CM | POA: Diagnosis not present

## 2023-02-24 DIAGNOSIS — D631 Anemia in chronic kidney disease: Secondary | ICD-10-CM | POA: Diagnosis not present

## 2023-02-24 DIAGNOSIS — I700 Atherosclerosis of aorta: Secondary | ICD-10-CM | POA: Diagnosis not present

## 2023-02-24 DIAGNOSIS — I130 Hypertensive heart and chronic kidney disease with heart failure and stage 1 through stage 4 chronic kidney disease, or unspecified chronic kidney disease: Secondary | ICD-10-CM | POA: Diagnosis not present

## 2023-02-24 DIAGNOSIS — I5032 Chronic diastolic (congestive) heart failure: Secondary | ICD-10-CM | POA: Diagnosis not present

## 2023-02-24 DIAGNOSIS — J4489 Other specified chronic obstructive pulmonary disease: Secondary | ICD-10-CM | POA: Diagnosis not present

## 2023-02-24 DIAGNOSIS — I482 Chronic atrial fibrillation, unspecified: Secondary | ICD-10-CM | POA: Diagnosis not present

## 2023-02-24 DIAGNOSIS — E1122 Type 2 diabetes mellitus with diabetic chronic kidney disease: Secondary | ICD-10-CM | POA: Diagnosis not present

## 2023-02-24 NOTE — Telephone Encounter (Signed)
I have sameday slots available. You can always use sameday slots for hospital follow up appointments. You can move patient to any of my sameday slots.

## 2023-02-24 NOTE — Telephone Encounter (Signed)
Copied from CRM [id number]. Topic: Appointment Scheduling - Scheduling Inquiry for Clinic >> Feb 24, 2023  2:11 PM Carrielelia G wrote: Reason for CRM:  Hello I scheduled pt for a hospital follow up and rx appt.  With  NP Suzie Portela.. Instruction was to schedule the patient with 7-14 days, but Dr is out to April 24.

## 2023-02-25 DIAGNOSIS — S3992XA Unspecified injury of lower back, initial encounter: Secondary | ICD-10-CM | POA: Diagnosis not present

## 2023-02-25 DIAGNOSIS — I10 Essential (primary) hypertension: Secondary | ICD-10-CM | POA: Diagnosis not present

## 2023-02-25 DIAGNOSIS — W19XXXA Unspecified fall, initial encounter: Secondary | ICD-10-CM | POA: Diagnosis not present

## 2023-02-25 NOTE — Progress Notes (Deleted)
Established patient visit   Patient: Lawrence [name]   DOB: 1956/06/22   68 y.o. Male  MRN: [id number] Visit Date: 02/26/2023  Today's healthcare provider: Jacky Kindle, FNP   No chief complaint on file.  Subjective    HPI  Follow up Hospitalization  Patient was admitted to [facility] on 02/17/2023 and discharged on same day. Patient was admitted to [facility] on 02/21/2023 and discharged on 02/22/2023. He was treated for chronic pain of both lower extremities and chest pain. Treatment for this included HYDROmorphone (DILAUDID) injection 1 mg, ondansetron (ZOFRAN) 4 mg, nitroGLYCERIN (NITROSTAT) 0.4 mg, morphine (PF) 4MG /ML, potassium chloride SA 40 mEq, traZODone HCL 25 mg, oxyCODONE immediate release tablet 10 mg, ketorolac (TORADOL) 30mg , Sodium Chloride 75 mL/hr, Atorvastatin Calcium 80mg , ALPRAZolam 1mg ,  Telephone follow up was done on 02/24/2023 He reports {excellent/good/fair:19992} compliance with treatment. He reports this condition is {resolved/improved/worsened:23923}.   ----------------------------------------------------------------------------------------- -     Medications: Outpatient Medications Prior to Visit  Medication Sig   Accu-Chek Softclix Lancets lancets Use as instructed to check sugar daily for type 2 diabetes.   acetaminophen (TYLENOL) 325 MG tablet Take 2 tablets (650 mg total) by mouth every 4 (four) hours as needed for headache or mild pain.   albuterol (VENTOLIN HFA) 108 (90 Base) MCG/ACT inhaler INHALE 2 PUFFS BY MOUTH EVERY 6 HOURS AS NEEDED FOR SHORTNESS OF BREATH (Patient not taking: Reported on 02/15/2023)   allopurinol (ZYLOPRIM) 300 MG tablet TAKE 1 TABLET BY MOUTH TWICE A DAY (Patient taking differently: Take 300 mg by mouth 2 (two) times daily.)   ALPRAZolam (XANAX) 1 MG tablet Take 1 tablet (1 mg total) by mouth 3 (three) times daily.   amiodarone (PACERONE) 200 MG tablet Take 200 mg by mouth 2 (two) times daily.   aspirin 81 MG chewable  tablet Chew 81 mg by mouth daily. Swallows whole   atorvastatin (LIPITOR) 80 MG tablet Take 1 tablet (80 mg total) by mouth daily. Please schedule office visit before any future refill.   Blood Glucose Calibration (ACCU-CHEK GUIDE CONTROL) LIQD Use with blood glucose monitor as directed   Blood Glucose Monitoring Suppl (ACCU-CHEK GUIDE) w/Device KIT Use to check blood sugars as directed   Budeson-Glycopyrrol-Formoterol (BREZTRI AEROSPHERE) 160-9-4.8 MCG/ACT AERO Inhale 2 puffs into the lungs in the morning and at bedtime.   celecoxib (CELEBREX) 200 MG capsule Take 1 capsule (200 mg total) by mouth 2 (two) times daily as needed. Home med.   cetirizine (ZYRTEC) 10 MG tablet TAKE 1 TABLET BY MOUTH AT BEDTIME   ELIQUIS 5 MG TABS tablet TAKE 1 TABLET BY MOUTH TWICE A DAY (Patient taking differently: Take 5 mg by mouth 2 (two) times daily.)   FARXIGA 10 MG TABS tablet TAKE 1 TABLET BY MOUTH DAILY (Patient taking differently: Take 10 mg by mouth daily. TAKE 1 TABLET BY MOUTH DAILY)   glucose blood (ACCU-CHEK GUIDE) test strip Use as instructed to check sugar daily for type 2 diabetes.   isosorbide mononitrate (IMDUR) 60 MG 24 hr tablet Take 1-2 tablets (60-120 mg total) by mouth daily. Home med.   linaclotide (LINZESS) 72 MCG capsule Take 72 mcg by mouth daily before breakfast.   lisinopril (ZESTRIL) 10 MG tablet TAKE 1 TABLET BY MOUTH DAILY (Patient taking differently: Take 10 mg by mouth daily.)   meclizine (ANTIVERT) 25 MG tablet TAKE ONE TABLET BY THREE TIMES A DAY AS NEEDED FOR DIZZINESS (Patient not taking: Reported on 02/22/2023)   metFORMIN (GLUCOPHAGE-XR)  500 MG 24 hr tablet TAKE 1 TABLET BY MOUTH TWICE A DAY (Patient taking differently: Take 500 mg by mouth 2 (two) times daily with a meal.)   methocarbamol (ROBAXIN) 500 MG tablet Take 500 mg by mouth every 8 (eight) hours as needed for muscle spasms. Take 1 tablet by mouth every hour as needed for muscle spasms   metroNIDAZOLE (METROGEL) 1 % gel  Apply topically daily.   naloxone (NARCAN) nasal spray 4 mg/0.1 mL 1 spray into nostril x1 and may repeat every 2-3 minutes until patient is responsive or EMS arrives   nitroGLYCERIN (NITROSTAT) 0.4 MG SL tablet Place 1 tablet (0.4 mg total) under the tongue every 5 (five) minutes as needed for chest pain.   omega-3 acid ethyl esters (LOVAZA) 1 g capsule Take 4 capsules (4 g total) by mouth daily.   omeprazole (PRILOSEC) 40 MG capsule TAKE 1 CAPSULE BY MOUTH 2 TIMES DAILY 30 MINUTES BEFORE BREAKFAST AND DINNER   oxyCODONE-acetaminophen (PERCOCET) 10-325 MG tablet Take 1 tablet by mouth every 4 (four) hours as needed. for pain   ranolazine (RANEXA) 1000 MG SR tablet Take 1 tablet (1,000 mg total) by mouth 2 (two) times daily.   Suvorexant (BELSOMRA) 10 MG TABS Take 1 tablet (10 mg total) by mouth at bedtime as needed.   torsemide (DEMADEX) 100 MG tablet Take 0.5 tablets (50 mg total) by mouth daily.   traZODone (DESYREL) 150 MG tablet TAKE 1 TABLET BY MOUTH AT BEDTIME   venlafaxine XR (EFFEXOR-XR) 75 MG 24 hr capsule TAKE 1 CAPSULE BY MOUTH DAILY WITH BREAKFAST   No facility-administered medications prior to visit.    Review of Systems  {Labs  Heme  Chem  Endocrine  Serology  Results Review (optional):23779}   Objective    There were no vitals taken for this visit. {Show previous vital signs (optional):23777}  Physical Exam  ***  No results found for any visits on 02/26/23.  Assessment & Plan     ***  No follow-ups on file.      {provider attestation***:1}   Jacky Kindle, FNP  [facility] [phone] (phone) [phone] (fax)  [facility]

## 2023-02-25 NOTE — Telephone Encounter (Signed)
Spoke with pt. And moved appt to 03/01/23 at 11:20 a.m.

## 2023-02-26 ENCOUNTER — Emergency Department: Payer: Medicare HMO

## 2023-02-26 ENCOUNTER — Emergency Department
Admission: EM | Admit: 2023-02-26 | Discharge: 2023-02-26 | Disposition: A | Discharge status: Home / Self Care | Payer: Medicare HMO | Attending: Emergency Medicine | Admitting: Emergency Medicine

## 2023-02-26 ENCOUNTER — Inpatient Hospital Stay: Payer: Medicare HMO | Admitting: Family Medicine

## 2023-02-26 ENCOUNTER — Encounter: Payer: Self-pay | Admitting: Emergency Medicine

## 2023-02-26 DIAGNOSIS — W0110XA Fall on same level from slipping, tripping and stumbling with subsequent striking against unspecified object, initial encounter: Secondary | ICD-10-CM | POA: Insufficient documentation

## 2023-02-26 DIAGNOSIS — S0990XA Unspecified injury of head, initial encounter: Secondary | ICD-10-CM | POA: Insufficient documentation

## 2023-02-26 DIAGNOSIS — Z7901 Long term (current) use of anticoagulants: Secondary | ICD-10-CM | POA: Insufficient documentation

## 2023-02-26 DIAGNOSIS — I6789 Other cerebrovascular disease: Secondary | ICD-10-CM | POA: Diagnosis not present

## 2023-02-26 DIAGNOSIS — W19XXXA Unspecified fall, initial encounter: Secondary | ICD-10-CM

## 2023-02-26 DIAGNOSIS — G894 Chronic pain syndrome: Secondary | ICD-10-CM | POA: Insufficient documentation

## 2023-02-26 DIAGNOSIS — M545 Low back pain, unspecified: Secondary | ICD-10-CM | POA: Diagnosis not present

## 2023-02-26 DIAGNOSIS — R0789 Other chest pain: Secondary | ICD-10-CM | POA: Diagnosis not present

## 2023-02-26 DIAGNOSIS — Z043 Encounter for examination and observation following other accident: Secondary | ICD-10-CM | POA: Diagnosis not present

## 2023-02-26 LAB — CBG MONITORING, ED: Glucose-Capillary: 138 mg/dL — ABNORMAL HIGH (ref 70–99)

## 2023-02-26 MED ORDER — OXYCODONE HCL 5 MG PO TABS
15.0000 mg | ORAL_TABLET | Freq: Once | ORAL | Status: AC
  Administered 2023-02-26: 15 mg via ORAL
  Filled 2023-02-26: qty 3

## 2023-02-26 MED ORDER — ACETAMINOPHEN 500 MG PO TABS
1000.0000 mg | ORAL_TABLET | Freq: Once | ORAL | Status: AC
  Administered 2023-02-26: 1000 mg via ORAL
  Filled 2023-02-26: qty 2

## 2023-02-26 NOTE — ED Provider Notes (Signed)
[facility] Provider Note    Event Date/Time   First MD Initiated Contact with Patient 02/26/23 0234     (approximate)   History   Fall   HPI  Lawrence [name] is a 67 y.o. male who presents to the ED for evaluation of Fall   I reviewed PCP visit from 4/1.  Chronic pain syndrome on chronic opiates.  Anticoagulated on Eliquis due to atrial fibrillation.  Patient presents to the ED from home after a fall.  Reports mechanical fall where he got tripped up and fell over his bed, landing onto his occiput.  No syncope.  No other complaints.   Physical Exam   Triage Vital Signs: ED Triage Vitals  Enc Vitals Group     BP 02/26/23 0019 (!) 141/92     Pulse Rate 02/26/23 0019 92     Resp 02/26/23 0019 18     Temp 02/26/23 0019 97.7 F (36.5 C)     Temp Source 02/26/23 0019 Oral     SpO2 02/26/23 0019 98 %     Weight 02/26/23 0020 193 lb 1.1 oz (87.6 kg)     Height 02/26/23 0020 5\' 7"  (1.702 m)     Head Circumference --      Peak Flow --      Pain Score 02/26/23 0019 10     Pain Loc --      Pain Edu? --      Excl. in GC? --     Most recent vital signs: Vitals:   02/26/23 0019  BP: (!) 141/92  Pulse: 92  Resp: 18  Temp: 97.7 F (36.5 C)  SpO2: 98%    General: Awake, no distress.  CV:  Good peripheral perfusion.  Resp:  Normal effort.  Abd:  No distention.  MSK:  No deformity noted.  No signs of trauma to the scalp. Neuro:  No focal deficits appreciated. Other:     ED Results / Procedures / Treatments   Labs (all labs ordered are listed, but only abnormal results are displayed) Labs Reviewed  CBG MONITORING, ED    EKG   RADIOLOGY CT head interpreted by me without evidence of acute intracranial pathology CT cervical spine interpreted by me without evidence of fracture or dislocation Plain film of the lumbar spine interpreted by me without evidence of fracture or dislocation  Official radiology report(s): CT Cervical Spine Wo  Contrast  Result Date: 02/26/2023 CLINICAL DATA:  Status post fall. EXAM: CT CERVICAL SPINE WITHOUT CONTRAST TECHNIQUE: Multidetector CT imaging of the cervical spine was performed without intravenous contrast. Multiplanar CT image reconstructions were also generated. RADIATION DOSE REDUCTION: This exam was performed according to the departmental dose-optimization program which includes automated exposure control, adjustment of the mA and/or kV according to patient size and/or use of iterative reconstruction technique. COMPARISON:  February 04, 2023 FINDINGS: Alignment: There is straightening of the normal cervical spine lordosis. Skull base and vertebrae: No acute fracture. Stable osteopenic changes are seen. Soft tissues and spinal canal: No prevertebral fluid or swelling. No visible canal hematoma. Disc levels:  Normal multilevel endplates are seen. There is marked severity narrowing of the anterior atlantoaxial articulation. Normal multilevel intervertebral discs spaces are present. Bilateral mild to moderate severity multilevel facet joint hypertrophy is noted. Upper chest: Multiple sternal wires are present. Other: None. IMPRESSION: 1. No acute fracture or subluxation in the cervical spine. Electronically Signed   By: Aram Candela M.D.   On: 02/26/2023 01:01  CT HEAD WO CONTRAST ( )  Result Date: 02/26/2023 CLINICAL DATA:  Status post fall. EXAM: CT HEAD WITHOUT CONTRAST TECHNIQUE: Contiguous axial images were obtained from the base of the skull through the vertex without intravenous contrast. RADIATION DOSE REDUCTION: This exam was performed according to the departmental dose-optimization program which includes automated exposure control, adjustment of the mA and/or kV according to patient size and/or use of iterative reconstruction technique. COMPARISON:  February 04, 2023 FINDINGS: Brain: There is generalized cerebral atrophy with widening of the extra-axial spaces and ventricular dilatation. There  are areas of decreased attenuation within the white matter tracts of the supratentorial brain, consistent with microvascular disease changes. Vascular: No hyperdense vessel or unexpected calcification. Skull: Normal. Negative for fracture or focal lesion. Sinuses/Orbits: No acute finding. Other: None. IMPRESSION: 1. Generalized cerebral atrophy and microvascular disease changes of the supratentorial brain. 2. No acute intracranial abnormality. Electronically Signed   By: Aram Candela M.D.   On: 02/26/2023 00:58   DG Lumbar Spine Complete  Result Date: 02/26/2023 CLINICAL DATA:  Fall, low back pain EXAM: LUMBAR SPINE - COMPLETE 4+ VIEW COMPARISON:  02/04/2023 FINDINGS: Degenerative facet disease in the lower lumbar spine. Degenerative disc disease in the lower thoracic spine with disc space narrowing and anterior spurring. No acute fracture or subluxation. SI joints symmetric and unremarkable. Aortic atherosclerosis. No visible aneurysm. IMPRESSION: Degenerative changes.  No acute bony abnormality. Electronically Signed   By: Charlett Nose M.D.   On: 02/26/2023 00:50    PROCEDURES and INTERVENTIONS:  Procedures  Medications  oxyCODONE (Oxy IR/ROXICODONE) immediate release tablet 15 mg (has no administration in time range)  acetaminophen (TYLENOL) tablet 1,000 mg (has no administration in time range)     IMPRESSION / MDM / ASSESSMENT AND PLAN / ED COURSE  I reviewed the triage vital signs and the nursing notes.  Differential diagnosis includes, but is not limited to, ICH, skull fracture, malingering, chronic pain, syncope, seizure, cardiac dysrhythmia  {Patient presents with symptoms of an acute illness or injury that is potentially life-threatening.  Anticoagulated patient presents after mechanical fall without evidence of acute injury and suitable for outpatient management.  Imaging is reassuring, as above.  We will treat his acute on chronic pain and discharge.  He has an upcoming PCP  visit next week with Dr. Sherrie Mustache on the 15th.  We discussed return precautions for the ED in the meantime      FINAL CLINICAL IMPRESSION(S) / ED DIAGNOSES   Final diagnoses:  Fall, initial encounter  Injury of head, initial encounter  Anticoagulated  Chronic pain syndrome     Rx / DC Orders   ED Discharge Orders     None        Note:  This document was prepared using Dragon voice recognition software and may include unintentional dictation errors.   Delton Prairie, MD 02/26/23 [phone]

## 2023-02-26 NOTE — ED Triage Notes (Signed)
Pt presents via [facility] from home following a fall tonight. Pt tripped and fell backwards hitting hitting his head. No LOC - pt is on Eliquis. Pt was ambulatory following the fall. Endorses chronic back pain that has been made worse since falling tonight. A&Ox4 at this time. Denies CP or SOB.

## 2023-03-01 ENCOUNTER — Encounter: Payer: Self-pay | Admitting: Family Medicine

## 2023-03-01 ENCOUNTER — Ambulatory Visit (INDEPENDENT_AMBULATORY_CARE_PROVIDER_SITE_OTHER): Payer: Medicare HMO | Admitting: Family Medicine

## 2023-03-01 ENCOUNTER — Encounter: Payer: Self-pay | Admitting: Emergency Medicine

## 2023-03-01 ENCOUNTER — Other Ambulatory Visit: Payer: Self-pay

## 2023-03-01 VITALS — BP 164/92 | HR 86 | Ht 67.0 in | Wt 201.0 lb

## 2023-03-01 DIAGNOSIS — Z79899 Other long term (current) drug therapy: Secondary | ICD-10-CM | POA: Diagnosis not present

## 2023-03-01 DIAGNOSIS — R609 Edema, unspecified: Secondary | ICD-10-CM | POA: Diagnosis not present

## 2023-03-01 DIAGNOSIS — Z87891 Personal history of nicotine dependence: Secondary | ICD-10-CM | POA: Insufficient documentation

## 2023-03-01 DIAGNOSIS — Z85828 Personal history of other malignant neoplasm of skin: Secondary | ICD-10-CM | POA: Diagnosis not present

## 2023-03-01 DIAGNOSIS — I482 Chronic atrial fibrillation, unspecified: Secondary | ICD-10-CM | POA: Diagnosis not present

## 2023-03-01 DIAGNOSIS — W0110XA Fall on same level from slipping, tripping and stumbling with subsequent striking against unspecified object, initial encounter: Secondary | ICD-10-CM | POA: Insufficient documentation

## 2023-03-01 DIAGNOSIS — I130 Hypertensive heart and chronic kidney disease with heart failure and stage 1 through stage 4 chronic kidney disease, or unspecified chronic kidney disease: Secondary | ICD-10-CM | POA: Insufficient documentation

## 2023-03-01 DIAGNOSIS — N182 Chronic kidney disease, stage 2 (mild): Secondary | ICD-10-CM | POA: Insufficient documentation

## 2023-03-01 DIAGNOSIS — I739 Peripheral vascular disease, unspecified: Secondary | ICD-10-CM | POA: Diagnosis not present

## 2023-03-01 DIAGNOSIS — Z125 Encounter for screening for malignant neoplasm of prostate: Secondary | ICD-10-CM | POA: Diagnosis not present

## 2023-03-01 DIAGNOSIS — Z7984 Long term (current) use of oral hypoglycemic drugs: Secondary | ICD-10-CM | POA: Diagnosis not present

## 2023-03-01 DIAGNOSIS — E538 Deficiency of other specified B group vitamins: Secondary | ICD-10-CM | POA: Diagnosis not present

## 2023-03-01 DIAGNOSIS — M79604 Pain in right leg: Secondary | ICD-10-CM | POA: Diagnosis not present

## 2023-03-01 DIAGNOSIS — W19XXXA Unspecified fall, initial encounter: Secondary | ICD-10-CM | POA: Diagnosis not present

## 2023-03-01 DIAGNOSIS — J449 Chronic obstructive pulmonary disease, unspecified: Secondary | ICD-10-CM | POA: Diagnosis not present

## 2023-03-01 DIAGNOSIS — I5032 Chronic diastolic (congestive) heart failure: Secondary | ICD-10-CM | POA: Diagnosis not present

## 2023-03-01 DIAGNOSIS — E1122 Type 2 diabetes mellitus with diabetic chronic kidney disease: Secondary | ICD-10-CM | POA: Diagnosis not present

## 2023-03-01 DIAGNOSIS — R0781 Pleurodynia: Secondary | ICD-10-CM

## 2023-03-01 DIAGNOSIS — M79605 Pain in left leg: Secondary | ICD-10-CM | POA: Diagnosis not present

## 2023-03-01 DIAGNOSIS — S7011XA Contusion of right thigh, initial encounter: Secondary | ICD-10-CM | POA: Diagnosis not present

## 2023-03-01 DIAGNOSIS — I2510 Atherosclerotic heart disease of native coronary artery without angina pectoris: Secondary | ICD-10-CM | POA: Insufficient documentation

## 2023-03-01 DIAGNOSIS — Z7901 Long term (current) use of anticoagulants: Secondary | ICD-10-CM | POA: Insufficient documentation

## 2023-03-01 DIAGNOSIS — Z7982 Long term (current) use of aspirin: Secondary | ICD-10-CM | POA: Insufficient documentation

## 2023-03-01 DIAGNOSIS — I10 Essential (primary) hypertension: Secondary | ICD-10-CM | POA: Diagnosis not present

## 2023-03-01 DIAGNOSIS — M25561 Pain in right knee: Secondary | ICD-10-CM | POA: Insufficient documentation

## 2023-03-01 DIAGNOSIS — I25118 Atherosclerotic heart disease of native coronary artery with other forms of angina pectoris: Secondary | ICD-10-CM

## 2023-03-01 DIAGNOSIS — E119 Type 2 diabetes mellitus without complications: Secondary | ICD-10-CM | POA: Diagnosis not present

## 2023-03-01 DIAGNOSIS — E1121 Type 2 diabetes mellitus with diabetic nephropathy: Secondary | ICD-10-CM

## 2023-03-01 DIAGNOSIS — R600 Localized edema: Secondary | ICD-10-CM | POA: Diagnosis not present

## 2023-03-01 MED ORDER — PREDNISONE 10 MG PO TABS
ORAL_TABLET | ORAL | 1 refills | Status: DC
Start: 2023-03-01 — End: 2023-03-19

## 2023-03-01 NOTE — Progress Notes (Addendum)
Established patient visit   Patient: Lawrence [name]   DOB: 11/05/1956   67 y.o. Male  MRN: [id number] Visit Date: 03/01/2023  Today's healthcare provider: [name], MD   No chief complaint on file.  Subjective    HPI Here today to follow up for recurrent hospital/ER visits for persistent chest pains. He was seen here for same on 02/15/2023 and prescribed 6 day prednisone taper for presumed pleurisy. He states pain completely resolved while taking prednisone, but returned shortly after finishing it. He was also seen by Dr. Juliann Pares today with large hematoma and right thigh and reports that he was advised to put Eliquis temporarily on hold. He is schedule to follow up with pulmonology on 04-06-23.   Medications: Outpatient Medications Prior to Visit  Medication Sig   Accu-Chek Softclix Lancets lancets Use as instructed to check sugar daily for type 2 diabetes.   acetaminophen (TYLENOL) 325 MG tablet Take 2 tablets (650 mg total) by mouth every 4 (four) hours as needed for headache or mild pain.   albuterol (VENTOLIN HFA) 108 (90 Base) MCG/ACT inhaler INHALE 2 PUFFS BY MOUTH EVERY 6 HOURS AS NEEDED FOR SHORTNESS OF BREATH   allopurinol (ZYLOPRIM) 300 MG tablet TAKE 1 TABLET BY MOUTH TWICE A DAY (Patient taking differently: Take 300 mg by mouth 2 (two) times daily.)   ALPRAZolam (XANAX) 1 MG tablet Take 1 tablet (1 mg total) by mouth 3 (three) times daily.   amiodarone (PACERONE) 200 MG tablet Take 200 mg by mouth 2 (two) times daily.   aspirin 81 MG chewable tablet Chew 81 mg by mouth daily. Swallows whole   atorvastatin (LIPITOR) 80 MG tablet Take 1 tablet (80 mg total) by mouth daily. Please schedule office visit before any future refill.   Blood Glucose Calibration (ACCU-CHEK GUIDE CONTROL) LIQD Use with blood glucose monitor as directed   Blood Glucose Monitoring Suppl (ACCU-CHEK GUIDE) w/Device KIT Use to check blood sugars as directed   Budeson-Glycopyrrol-Formoterol (BREZTRI  AEROSPHERE) 160-9-4.8 MCG/ACT AERO Inhale 2 puffs into the lungs in the morning and at bedtime.   celecoxib (CELEBREX) 200 MG capsule Take 1 capsule (200 mg total) by mouth 2 (two) times daily as needed. Home med.   cetirizine (ZYRTEC) 10 MG tablet TAKE 1 TABLET BY MOUTH AT BEDTIME   FARXIGA 10 MG TABS tablet TAKE 1 TABLET BY MOUTH DAILY (Patient taking differently: Take 10 mg by mouth daily. TAKE 1 TABLET BY MOUTH DAILY)   glucose blood (ACCU-CHEK GUIDE) test strip Use as instructed to check sugar daily for type 2 diabetes.   isosorbide mononitrate (IMDUR) 60 MG 24 hr tablet Take 1-2 tablets (60-120 mg total) by mouth daily. Home med.   linaclotide (LINZESS) 72 MCG capsule Take 72 mcg by mouth daily before breakfast.   lisinopril (ZESTRIL) 10 MG tablet TAKE 1 TABLET BY MOUTH DAILY (Patient taking differently: Take 10 mg by mouth daily.)   meclizine (ANTIVERT) 25 MG tablet TAKE ONE TABLET BY THREE TIMES A DAY AS NEEDED FOR DIZZINESS   metFORMIN (GLUCOPHAGE-XR) 500 MG 24 hr tablet TAKE 1 TABLET BY MOUTH TWICE A DAY (Patient taking differently: Take 500 mg by mouth 2 (two) times daily with a meal.)   methocarbamol (ROBAXIN) 500 MG tablet Take 500 mg by mouth every 8 (eight) hours as needed for muscle spasms. Take 1 tablet by mouth every hour as needed for muscle spasms   metroNIDAZOLE (METROGEL) 1 % gel Apply topically daily.   naloxone (  NARCAN) nasal spray 4 mg/0.1 mL 1 spray into nostril x1 and may repeat every 2-3 minutes until patient is responsive or EMS arrives   nitroGLYCERIN (NITROSTAT) 0.4 MG SL tablet Place 1 tablet (0.4 mg total) under the tongue every 5 (five) minutes as needed for chest pain.   omega-3 acid ethyl esters (LOVAZA) 1 g capsule Take 4 capsules (4 g total) by mouth daily.   omeprazole (PRILOSEC) 40 MG capsule TAKE 1 CAPSULE BY MOUTH 2 TIMES DAILY 30 MINUTES BEFORE BREAKFAST AND DINNER   oxyCODONE-acetaminophen (PERCOCET) 10-325 MG tablet Take 1 tablet by mouth every 4 (four)  hours as needed. for pain   ranolazine (RANEXA) 1000 MG SR tablet Take 1 tablet (1,000 mg total) by mouth 2 (two) times daily.   Suvorexant (BELSOMRA) 10 MG TABS Take 1 tablet (10 mg total) by mouth at bedtime as needed.   torsemide (DEMADEX) 100 MG tablet Take 0.5 tablets (50 mg total) by mouth daily.   traZODone (DESYREL) 150 MG tablet TAKE 1 TABLET BY MOUTH AT BEDTIME   venlafaxine XR (EFFEXOR-XR) 75 MG 24 hr capsule TAKE 1 CAPSULE BY MOUTH DAILY WITH BREAKFAST   ELIQUIS 5 MG TABS tablet TAKE 1 TABLET BY MOUTH TWICE A DAY (Patient not taking: Reported on 03/01/2023)   No facility-administered medications prior to visit.    Review of Systems     Objective    BP (!) 164/92 (BP Location: Right Arm, Patient Position: Sitting, Cuff Size: Normal)   Pulse 86   Ht 5\' 7"  (1.702 m)   Wt 201 lb (91.2 kg)   SpO2 97%   BMI 31.48 kg/m    Physical Exam   General: Appearance:    Mildly obese male in no acute distress  Eyes:    PERRL, conjunctiva/corneas clear, EOM's intact       Lungs:     Clear to auscultation bilaterally, respirations unlabored  Heart:    Normal heart rate. Irregularly irregular rhythm. No murmurs, rubs, or gallops.    MS:   All extremities are intact.    Neurologic:   Awake, alert, oriented x 3. No apparent focal neurological defect.         Assessment & Plan     1. Pleuritic chest pain By patient report, pain resolved on 6 day prednisone taper. Will resume low dose for several weeks. May also be neuropathic component and may consider trial of pregabalin.  - predniSONE (DELTASONE) 10 MG tablet; Take 2 tablets daily for 7 days, then reduce to 1 tablet daily  Dispense: 30 tablet; Refill: 1  2. Coronary artery disease of native artery of native heart with stable angina pectoris Follow up by Dr. Juliann Pares. On Renexa. Recent chest pains not c/w cardiac angina.   3. Atrial fibrillation, chronic Eliquis on hold starting today per Dr. Juliann Pares due to large hematoma on right  thigh by patient report.   4. Primary hypertension Not at goal. Consider betablocker, although he does have history of bradycardia. No cardiology follow up until October.   5. B12 deficiency  - Vitamin B12  6. Type 2 diabetes mellitus with diabetic nephropathy, without long-term current use of insulin  - Hemoglobin A1c - Lipid panel   7. Prostate cancer screening  - PSA  8. Urinary incontinence He has not been able to get himself to restroom in time to void. He is on multiple medications that cause urinary frequency and urgency and has severe underlying cardiac and pulmonary disease that several slow down  and limit his mobility. He requires adult absorptive pull ups due to inability to get to toilet in sufficient time due to these underlying medication conditions.   Future Appointments  Date Time Provider Department Center  03/10/2023  3:00 PM [facility]ANNUAL WELLNESS VISIT [name]  03/24/2023  1:30 PM [facility] VASC 2 [facility] None  03/24/2023  2:30 PM Georgiana Spinner, NP [facility] None  03/29/2023  3:00 PM Malva Limes, MD [name]  04/06/2023 11:30 AM [name], MD [facility] None  08/02/2023  3:00 PM [facility]              [name], MD  [facility] [phone] (phone) [phone] (fax)  [facility]

## 2023-03-01 NOTE — ED Triage Notes (Signed)
EMS  brings pt in from home for c/o leg pain; pt ambulatory on scene; to triage via w/c with no distress noted; pt reports lost his balance and fell PTA; purplish  bruising noted to inner rt knee/thigh

## 2023-03-01 NOTE — Patient Instructions (Signed)
Please go to the lab draw station in [address] on the second floor of [facility]  when you are fasting for 8 hours. Normal hours are 8:00am to 11:30am and 1:00pm to 4:00pm Monday through Friday Please review the attached list of medications and notify my office if there are any errors.   Please bring all of your medications to every appointment so we can make sure that our medication list is the same as yours.

## 2023-03-02 ENCOUNTER — Emergency Department: Payer: Medicare HMO

## 2023-03-02 ENCOUNTER — Emergency Department
Admission: EM | Admit: 2023-03-02 | Discharge: 2023-03-02 | Disposition: A | Discharge status: Home / Self Care | Payer: Medicare HMO | Attending: Emergency Medicine | Admitting: Emergency Medicine

## 2023-03-02 DIAGNOSIS — W19XXXA Unspecified fall, initial encounter: Secondary | ICD-10-CM

## 2023-03-02 DIAGNOSIS — Z955 Presence of coronary angioplasty implant and graft: Secondary | ICD-10-CM | POA: Diagnosis not present

## 2023-03-02 DIAGNOSIS — E782 Mixed hyperlipidemia: Secondary | ICD-10-CM | POA: Diagnosis not present

## 2023-03-02 DIAGNOSIS — E1122 Type 2 diabetes mellitus with diabetic chronic kidney disease: Secondary | ICD-10-CM | POA: Diagnosis not present

## 2023-03-02 DIAGNOSIS — M25561 Pain in right knee: Secondary | ICD-10-CM

## 2023-03-02 DIAGNOSIS — I5032 Chronic diastolic (congestive) heart failure: Secondary | ICD-10-CM | POA: Diagnosis not present

## 2023-03-02 DIAGNOSIS — I700 Atherosclerosis of aorta: Secondary | ICD-10-CM | POA: Diagnosis not present

## 2023-03-02 DIAGNOSIS — D631 Anemia in chronic kidney disease: Secondary | ICD-10-CM | POA: Diagnosis not present

## 2023-03-02 DIAGNOSIS — N182 Chronic kidney disease, stage 2 (mild): Secondary | ICD-10-CM | POA: Diagnosis not present

## 2023-03-02 DIAGNOSIS — S7011XA Contusion of right thigh, initial encounter: Secondary | ICD-10-CM

## 2023-03-02 DIAGNOSIS — E669 Obesity, unspecified: Secondary | ICD-10-CM | POA: Diagnosis not present

## 2023-03-02 DIAGNOSIS — I10 Essential (primary) hypertension: Secondary | ICD-10-CM | POA: Diagnosis not present

## 2023-03-02 DIAGNOSIS — J4489 Other specified chronic obstructive pulmonary disease: Secondary | ICD-10-CM | POA: Diagnosis not present

## 2023-03-02 DIAGNOSIS — I5023 Acute on chronic systolic (congestive) heart failure: Secondary | ICD-10-CM | POA: Diagnosis not present

## 2023-03-02 DIAGNOSIS — I739 Peripheral vascular disease, unspecified: Secondary | ICD-10-CM | POA: Diagnosis not present

## 2023-03-02 DIAGNOSIS — I255 Ischemic cardiomyopathy: Secondary | ICD-10-CM | POA: Diagnosis not present

## 2023-03-02 DIAGNOSIS — Z951 Presence of aortocoronary bypass graft: Secondary | ICD-10-CM | POA: Diagnosis not present

## 2023-03-02 DIAGNOSIS — I130 Hypertensive heart and chronic kidney disease with heart failure and stage 1 through stage 4 chronic kidney disease, or unspecified chronic kidney disease: Secondary | ICD-10-CM | POA: Diagnosis not present

## 2023-03-02 DIAGNOSIS — I25110 Atherosclerotic heart disease of native coronary artery with unstable angina pectoris: Secondary | ICD-10-CM | POA: Diagnosis not present

## 2023-03-02 DIAGNOSIS — R600 Localized edema: Secondary | ICD-10-CM | POA: Diagnosis not present

## 2023-03-02 DIAGNOSIS — I482 Chronic atrial fibrillation, unspecified: Secondary | ICD-10-CM | POA: Diagnosis not present

## 2023-03-02 LAB — CBC
HCT: 41.7 % (ref 39.0–52.0)
Hemoglobin: 12.9 g/dL — ABNORMAL LOW (ref 13.0–17.0)
MCH: 28.2 pg (ref 26.0–34.0)
MCHC: 30.9 g/dL (ref 30.0–36.0)
MCV: 91 fL (ref 80.0–100.0)
Platelets: 219 10*3/uL (ref 150–400)
RBC: 4.58 MIL/uL (ref 4.22–5.81)
RDW: 16.5 % — ABNORMAL HIGH (ref 11.5–15.5)
WBC: 9 10*3/uL (ref 4.0–10.5)
nRBC: 0 % (ref 0.0–0.2)

## 2023-03-02 LAB — BASIC METABOLIC PANEL
Anion gap: 9 (ref 5–15)
BUN: 13 mg/dL (ref 8–23)
CO2: 27 mmol/L (ref 22–32)
Calcium: 8.7 mg/dL — ABNORMAL LOW (ref 8.9–10.3)
Chloride: 103 mmol/L (ref 98–111)
Creatinine, Ser: 0.97 mg/dL (ref 0.61–1.24)
GFR, Estimated: >60 mL/min (ref >60)
Glucose, Bld: 270 mg/dL — ABNORMAL HIGH (ref 70–99)
Potassium: 4.1 mmol/L (ref 3.5–5.1)
Sodium: 139 mmol/L (ref 135–145)

## 2023-03-02 MED ORDER — ACETAMINOPHEN 500 MG PO TABS
ORAL_TABLET | ORAL | Status: AC
  Filled 2023-03-02: qty 2

## 2023-03-02 MED ORDER — ACETAMINOPHEN 500 MG PO TABS
1000.0000 mg | ORAL_TABLET | Freq: Once | ORAL | Status: AC
  Administered 2023-03-02: 1000 mg via ORAL

## 2023-03-02 MED ORDER — HYDROMORPHONE HCL 1 MG/ML IJ SOLN
1.0000 mg | Freq: Once | INTRAMUSCULAR | Status: AC
  Administered 2023-03-02: 1 mg via INTRAVENOUS
  Filled 2023-03-02: qty 1

## 2023-03-02 NOTE — Discharge Instructions (Signed)
Wear Ace wrap and use your walker to help you balance as you walk.  Elevate affected area and apply ice over Ace wrap several times daily to reduce swelling.  Return to the ER for worsening symptoms, persistent vomiting, difficulty breathing or other concerns.
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
## 2023-03-02 NOTE — ED Notes (Signed)
Patient transported to CT 

## 2023-03-03 ENCOUNTER — Other Ambulatory Visit: Payer: Self-pay | Admitting: Family Medicine

## 2023-03-03 ENCOUNTER — Telehealth: Payer: Self-pay

## 2023-03-03 DIAGNOSIS — M5410 Radiculopathy, site unspecified: Secondary | ICD-10-CM

## 2023-03-03 NOTE — Telephone Encounter (Signed)
Copied from CRM [id number]. Topic: General - Other >> Mar 03, 2023  3:35 PM Franchot Heidelberg wrote: Reason for CRM: Pt called reporting that a prior auth is needed in order for his insurance to cover his pull ups, he says his PCP needs to file his medicaid for the other 20%

## 2023-03-04 DIAGNOSIS — I700 Atherosclerosis of aorta: Secondary | ICD-10-CM | POA: Diagnosis not present

## 2023-03-04 DIAGNOSIS — I482 Chronic atrial fibrillation, unspecified: Secondary | ICD-10-CM | POA: Diagnosis not present

## 2023-03-04 DIAGNOSIS — I5023 Acute on chronic systolic (congestive) heart failure: Secondary | ICD-10-CM | POA: Diagnosis not present

## 2023-03-04 DIAGNOSIS — D631 Anemia in chronic kidney disease: Secondary | ICD-10-CM | POA: Diagnosis not present

## 2023-03-04 DIAGNOSIS — I130 Hypertensive heart and chronic kidney disease with heart failure and stage 1 through stage 4 chronic kidney disease, or unspecified chronic kidney disease: Secondary | ICD-10-CM | POA: Diagnosis not present

## 2023-03-04 DIAGNOSIS — E1122 Type 2 diabetes mellitus with diabetic chronic kidney disease: Secondary | ICD-10-CM | POA: Diagnosis not present

## 2023-03-04 DIAGNOSIS — J4489 Other specified chronic obstructive pulmonary disease: Secondary | ICD-10-CM | POA: Diagnosis not present

## 2023-03-04 DIAGNOSIS — N182 Chronic kidney disease, stage 2 (mild): Secondary | ICD-10-CM | POA: Diagnosis not present

## 2023-03-04 DIAGNOSIS — I5032 Chronic diastolic (congestive) heart failure: Secondary | ICD-10-CM | POA: Diagnosis not present

## 2023-03-04 NOTE — Telephone Encounter (Signed)
Requested medication (s) are due for refill today: yes  Requested medication (s) are on the active medication list: yes  Last refill:  11/03/22  Future visit scheduled: yes  Notes to clinic:  Unable to refill per protocol, last refill by another provider not at this practice.     Requested Prescriptions  Pending Prescriptions Disp Refills   celecoxib (CELEBREX) 200 MG capsule [Pharmacy Med Name: CELECOXIB 200 MG CAP] 60 capsule     Sig: TAKE ONE CAPSULE BY MOUTH TWICE A DAY     Analgesics:  COX2 Inhibitors Failed - 03/03/2023  5:08 PM      Failed - Manual Review: Labs are only required if the patient has taken medication for more than 8 weeks.      Failed - HGB in normal range and within 360 days    Hemoglobin  Date Value Ref Range Status  03/02/2023 12.9 (L) 13.0 - 17.0 g/dL Final  16/08/9603 54.0 (HH) 13.0 - 17.7 g/dL Final    Comment:    Removal of plasma from EDTA tube will result in spuriously elevated CBC results. Redraw and repeat if results do not correlate with patient's clinical condition.          Failed - AST in normal range and within 360 days    AST  Date Value Ref Range Status  02/21/2023 14 (L) 15 - 41 U/L Final   SGOT(AST)  Date Value Ref Range Status  03/07/2015 25 U/L Final    Comment:    15-41 NOTE: New Reference Range  01/22/15          Passed - Cr in normal range and within 360 days    Creatinine  Date Value Ref Range Status  03/07/2015 0.84 mg/dL Final    Comment:    9.81-1.91 NOTE: New Reference Range  01/22/15    Creatinine, Ser  Date Value Ref Range Status  03/02/2023 0.97 0.61 - 1.24 mg/dL Final   Creatinine, POC  Date Value Ref Range Status  10/27/2016 n/a mg/dL Final         Passed - HCT in normal range and within 360 days    HCT  Date Value Ref Range Status  03/02/2023 41.7 39.0 - 52.0 % Final   Hematocrit  Date Value Ref Range Status  09/24/2020 62.2 (H) 37.5 - 51.0 % Final         Passed - ALT in normal  range and within 360 days    ALT  Date Value Ref Range Status  02/21/2023 12 0 - 44 U/L Final   SGPT (ALT)  Date Value Ref Range Status  03/07/2015 25 U/L Final    Comment:    17-63 NOTE: New Reference Range  01/22/15          Passed - eGFR is 30 or above and within 360 days    EGFR (African American)  Date Value Ref Range Status  03/07/2015 >60  Final   GFR calc Af Amer  Date Value Ref Range Status  09/24/2020 97 >59 mL/min/1.73 Final    Comment:    **In accordance with recommendations from the NKF-ASN Task force,**   [facility] is in the process of updating its eGFR calculation to the   2021 CKD-EPI creatinine equation that estimates kidney function   without a race variable.    EGFR (Non-African Amer.)  Date Value Ref Range Status  03/07/2015 >60  Final    Comment:    eGFR values <75mL/min/1.73 m2 may  be an indication of chronic kidney disease (CKD). Calculated eGFR is useful in patients with stable renal function. The eGFR calculation will not be reliable in acutely ill patients when serum creatinine is changing rapidly. It is not useful in patients on dialysis. The eGFR calculation may not be applicable to patients at the low and high extremes of body sizes, pregnant women, and vegetarians.    GFR, Estimated  Date Value Ref Range Status  03/02/2023 >60 >60 mL/min Final    Comment:    (NOTE) Calculated using the CKD-EPI Creatinine Equation (2021)    eGFR  Date Value Ref Range Status  12/23/2021 94 >59 mL/min/1.73 Final         Passed - Patient is not pregnant      Passed - Valid encounter within last 12 months    Recent Outpatient Visits           3 days ago Pleuritic chest pain   [place] [facility] Malva Limes, MD   2 weeks ago Pleuritic chest pain   [place] [facility] Malva Limes, MD   5 months ago Chronic respiratory failure with hypoxia [facility])   [place] [facility] Alfredia Ferguson, PA-C   7 months ago Ataxia   [facility] Malva Limes, MD   9 months ago Toe pain, left   [facility] Jacky Kindle, [place]       Future Appointments             In 3 weeks [name], Demetrios Isaacs, MD [name]

## 2023-03-05 ENCOUNTER — Telehealth: Payer: Self-pay

## 2023-03-05 NOTE — Telephone Encounter (Signed)
     Patient  visit on 4/16  at [place]    Have you been able to follow up with your primary care physician? Yes   The patient was or was not able to obtain any needed medicine or equipment. Yes   Are there diet recommendations that you are having difficulty following? Na   Patient expresses understanding of discharge instructions and education provided has no other needs at this time.  Yes      Lawrence [name] [facility], [place] [phone] [address], [place], [place] [address] Phone: [phone] Email: [email or web address]    

## 2023-03-07 DIAGNOSIS — E785 Hyperlipidemia, unspecified: Secondary | ICD-10-CM | POA: Diagnosis not present

## 2023-03-07 DIAGNOSIS — Z7901 Long term (current) use of anticoagulants: Secondary | ICD-10-CM | POA: Diagnosis not present

## 2023-03-07 DIAGNOSIS — I509 Heart failure, unspecified: Secondary | ICD-10-CM | POA: Diagnosis not present

## 2023-03-07 DIAGNOSIS — E119 Type 2 diabetes mellitus without complications: Secondary | ICD-10-CM | POA: Diagnosis not present

## 2023-03-07 DIAGNOSIS — I4891 Unspecified atrial fibrillation: Secondary | ICD-10-CM | POA: Diagnosis not present

## 2023-03-07 DIAGNOSIS — I110 Hypertensive heart disease with heart failure: Secondary | ICD-10-CM | POA: Diagnosis not present

## 2023-03-07 DIAGNOSIS — M79651 Pain in right thigh: Secondary | ICD-10-CM | POA: Diagnosis not present

## 2023-03-07 DIAGNOSIS — S8011XA Contusion of right lower leg, initial encounter: Secondary | ICD-10-CM | POA: Diagnosis not present

## 2023-03-08 ENCOUNTER — Telehealth: Payer: Self-pay | Admitting: Family Medicine

## 2023-03-08 DIAGNOSIS — I5023 Acute on chronic systolic (congestive) heart failure: Secondary | ICD-10-CM | POA: Diagnosis not present

## 2023-03-08 DIAGNOSIS — E538 Deficiency of other specified B group vitamins: Secondary | ICD-10-CM | POA: Diagnosis not present

## 2023-03-08 DIAGNOSIS — J4489 Other specified chronic obstructive pulmonary disease: Secondary | ICD-10-CM | POA: Diagnosis not present

## 2023-03-08 DIAGNOSIS — E1122 Type 2 diabetes mellitus with diabetic chronic kidney disease: Secondary | ICD-10-CM | POA: Diagnosis not present

## 2023-03-08 DIAGNOSIS — I482 Chronic atrial fibrillation, unspecified: Secondary | ICD-10-CM | POA: Diagnosis not present

## 2023-03-08 DIAGNOSIS — N182 Chronic kidney disease, stage 2 (mild): Secondary | ICD-10-CM | POA: Diagnosis not present

## 2023-03-08 DIAGNOSIS — E119 Type 2 diabetes mellitus without complications: Secondary | ICD-10-CM | POA: Diagnosis not present

## 2023-03-08 DIAGNOSIS — Z125 Encounter for screening for malignant neoplasm of prostate: Secondary | ICD-10-CM | POA: Diagnosis not present

## 2023-03-08 DIAGNOSIS — I130 Hypertensive heart and chronic kidney disease with heart failure and stage 1 through stage 4 chronic kidney disease, or unspecified chronic kidney disease: Secondary | ICD-10-CM | POA: Diagnosis not present

## 2023-03-08 DIAGNOSIS — D631 Anemia in chronic kidney disease: Secondary | ICD-10-CM | POA: Diagnosis not present

## 2023-03-08 DIAGNOSIS — I5032 Chronic diastolic (congestive) heart failure: Secondary | ICD-10-CM | POA: Diagnosis not present

## 2023-03-08 DIAGNOSIS — I700 Atherosclerosis of aorta: Secondary | ICD-10-CM | POA: Diagnosis not present

## 2023-03-08 IMAGING — DX DG CHEST 1V PORT
1 series · 2 of 2 positions shown · non-contrast
Comparison: 12/10/2020

CLINICAL DATA: Chest pain

EXAM:
PORTABLE CHEST 1 VIEW

[Series 1: chest ap · 0.14mm/px · 2 of 2 slices shown]
[im 1/2]
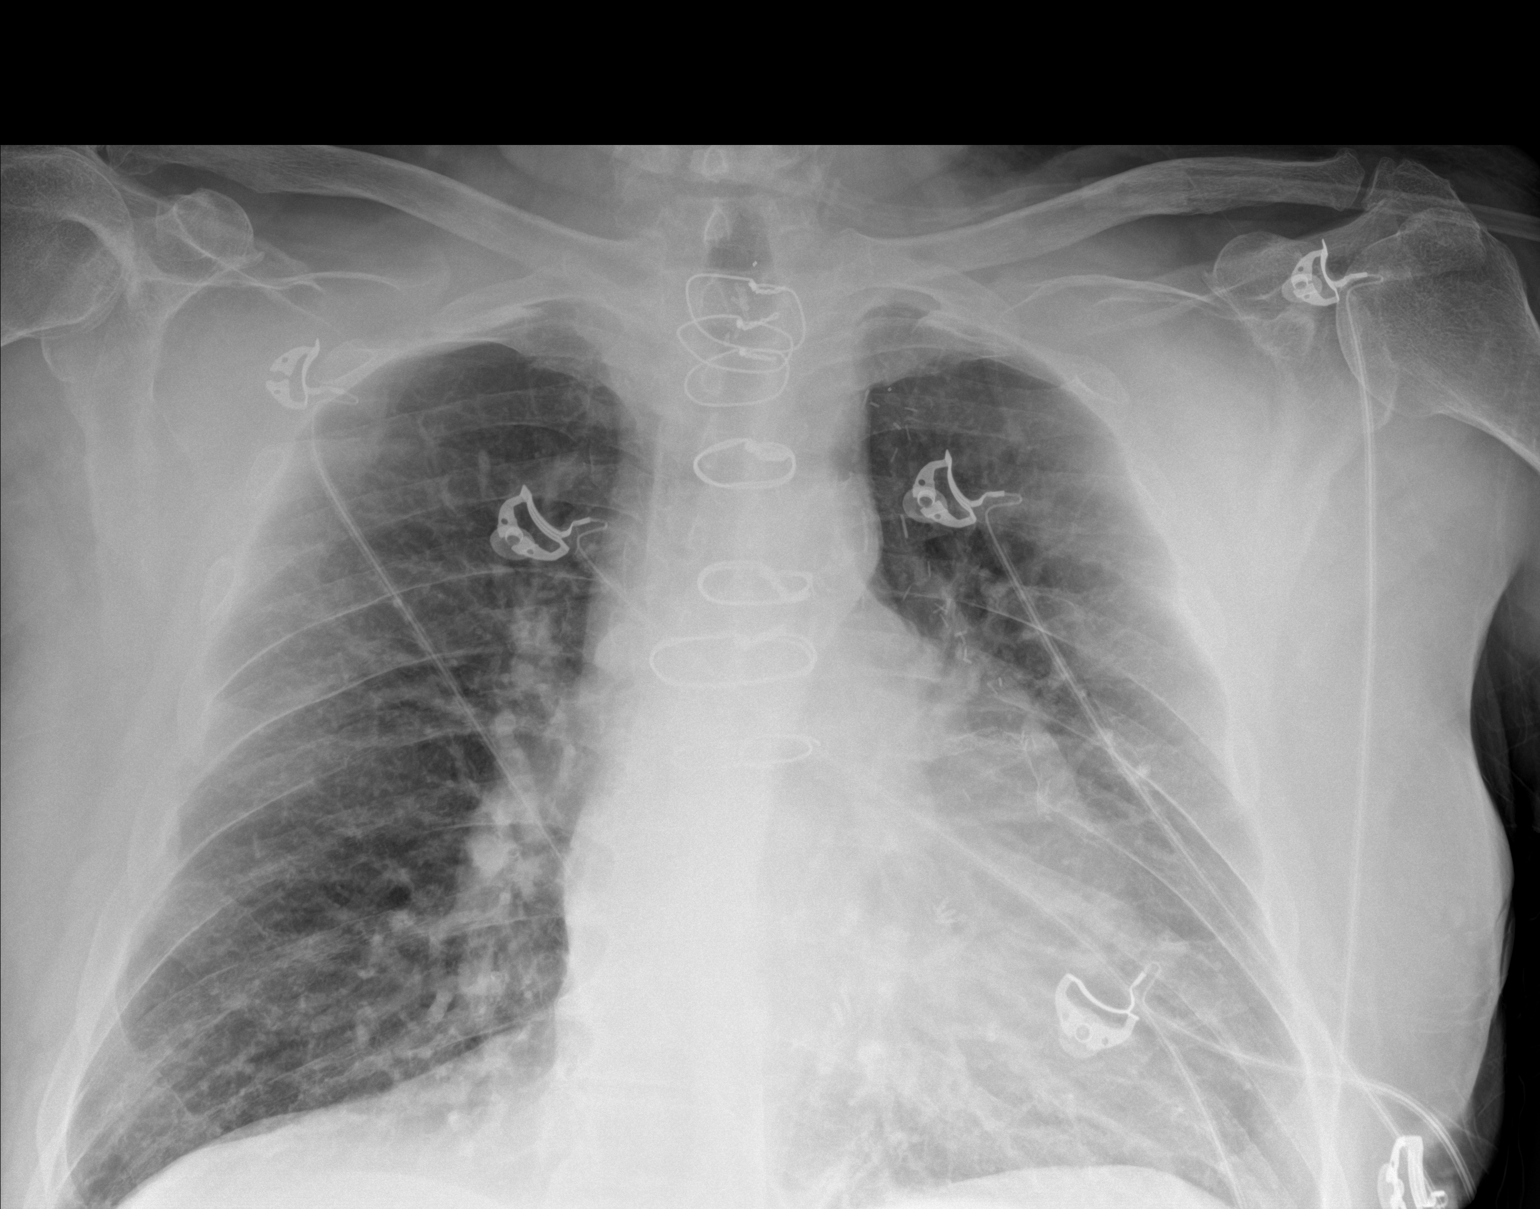
[im 2/2]
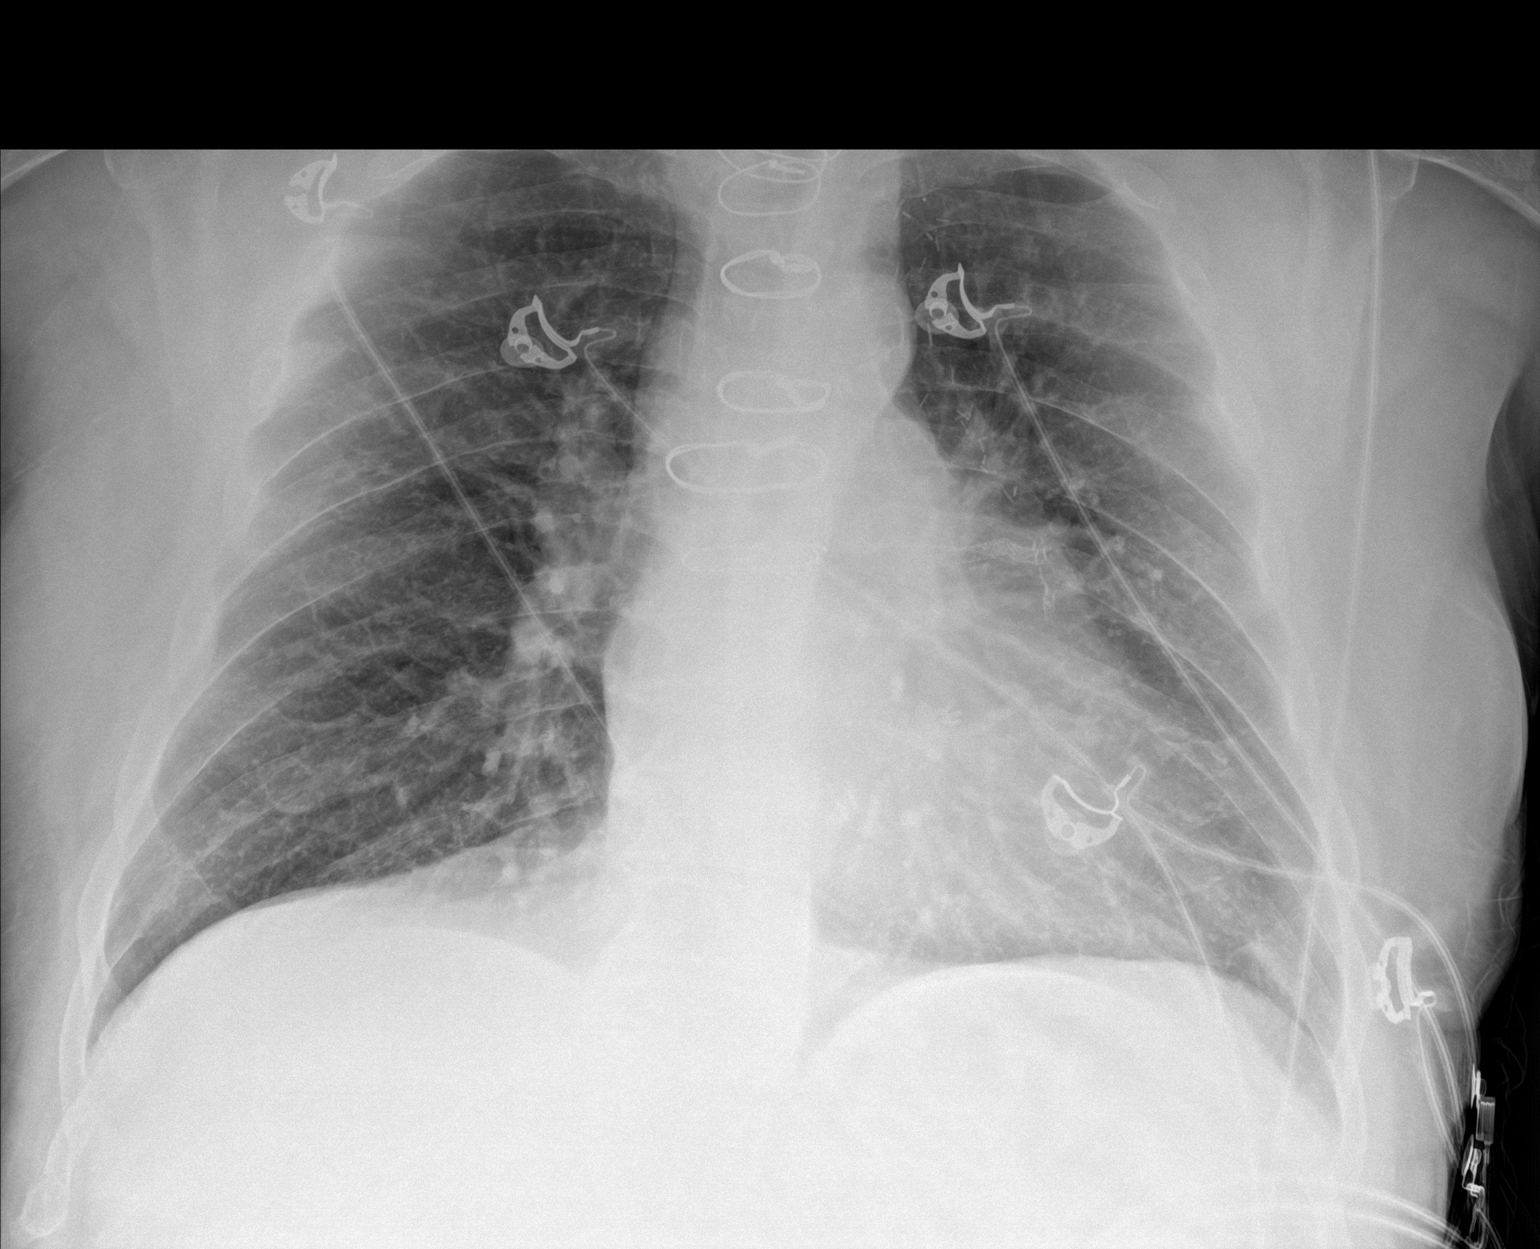

[2 of 2 positions shown; findings below may reference images not displayed]

FINDINGS: Prior CABG. Heart and mediastinal contours are within normal limits.
Scarring in the lingula. Right lung clear. No effusions. Heart is
normal size. No acute bony abnormality.
IMPRESSION: No active disease.

## 2023-03-08 NOTE — Telephone Encounter (Signed)
Pt called in about states of PA he needs for  order for his insurance to cover his pull ups, he says his PCP needs to file his medicaid for the other 20%

## 2023-03-09 ENCOUNTER — Other Ambulatory Visit: Payer: Self-pay | Admitting: Family Medicine

## 2023-03-09 DIAGNOSIS — E1121 Type 2 diabetes mellitus with diabetic nephropathy: Secondary | ICD-10-CM

## 2023-03-09 LAB — LIPID PANEL
Chol/HDL Ratio: 2 ratio (ref 0.0–5.0)
Cholesterol, Total: 100 mg/dL (ref 100–199)
HDL: 51 mg/dL (ref >39)
LDL Chol Calc (NIH): 35 mg/dL (ref 0–99)
Triglycerides: 65 mg/dL (ref 0–149)
VLDL Cholesterol Cal: 14 mg/dL (ref 5–40)

## 2023-03-09 LAB — VITAMIN B12: Vitamin B-12: 98 pg/mL — ABNORMAL LOW (ref 232–1245)

## 2023-03-09 LAB — HEMOGLOBIN A1C
Est. average glucose Bld gHb Est-mCnc: 143 mg/dL
Hgb A1c MFr Bld: 6.6 % — ABNORMAL HIGH (ref 4.8–5.6)

## 2023-03-09 LAB — PSA: Prostate Specific Ag, Serum: 1.4 ng/mL (ref 0.0–4.0)

## 2023-03-09 NOTE — Telephone Encounter (Signed)
Haven't seen a PA for this.... never seen one for pull-ups before.

## 2023-03-09 NOTE — Telephone Encounter (Signed)
Pt called back, advised of message from Dr. Sherrie Mustache. Pt states he was told he can take prescriptions for pull-ups and rollator to [facility] and they will be able to assist him. Advised him to do so and if any issues can CB. Pt verbalized understanding.   [facility] Address: [address] [place], [place], [place] [address] Open ? Closes 5?PM Phone: [phone]

## 2023-03-10 ENCOUNTER — Ambulatory Visit (INDEPENDENT_AMBULATORY_CARE_PROVIDER_SITE_OTHER): Payer: Medicare HMO

## 2023-03-10 VITALS — Ht 67.0 in | Wt 200.0 lb

## 2023-03-10 DIAGNOSIS — Z0000 Encounter for general adult medical examination without abnormal findings: Secondary | ICD-10-CM | POA: Diagnosis not present

## 2023-03-10 NOTE — Progress Notes (Signed)
I connected with  [name] on 03/10/23 by a audio enabled telemedicine application and verified that I am speaking with the correct person using two identifiers.  Patient Location: Home  Provider Location: Office/Clinic  I discussed the limitations of evaluation and management by telemedicine. The patient expressed understanding and agreed to proceed.  Subjective:   Lawrence [name] is a 67 y.o. male who presents for Medicare Annual/Subsequent preventive examination.  Review of Systems     Cardiac Risk Factors include: advanced age (>73men, >49 women);diabetes mellitus;hypertension;dyslipidemia;male gender;obesity (BMI >30kg/m2);sedentary lifestyle     Objective:    Today's Vitals   03/10/23 1507  Weight: 200 lb (90.7 kg)  Height: 5\' 7"  (1.702 m)  PainSc: 7    Body mass index is 31.32 kg/m.     03/10/2023    3:34 PM 03/01/2023   11:16 PM 02/21/2023   11:05 PM 02/16/2023    9:27 PM 02/09/2023    8:35 PM 02/06/2023    8:34 PM 01/30/2023   10:43 PM  Advanced Directives  Does Patient Have a Medical Advance Directive? No No No No No No No  Would patient like information on creating a medical advance directive?  No - Patient declined No - Patient declined  No - Patient declined      Current Medications (verified) Outpatient Encounter Medications as of 03/10/2023  Medication Sig   Accu-Chek Softclix Lancets lancets Use as instructed to check sugar daily for type 2 diabetes.   acetaminophen (TYLENOL) 325 MG tablet Take 2 tablets (650 mg total) by mouth every 4 (four) hours as needed for headache or mild pain.   albuterol (VENTOLIN HFA) 108 (90 Base) MCG/ACT inhaler INHALE 2 PUFFS BY MOUTH EVERY 6 HOURS AS NEEDED FOR SHORTNESS OF BREATH   allopurinol (ZYLOPRIM) 300 MG tablet TAKE 1 TABLET BY MOUTH TWICE A DAY (Patient taking differently: Take 300 mg by mouth 2 (two) times daily.)   ALPRAZolam (XANAX) 1 MG tablet Take 1 tablet (1 mg total) by mouth 3 (three) times daily.    amiodarone (PACERONE) 200 MG tablet Take 200 mg by mouth 2 (two) times daily.   aspirin 81 MG chewable tablet Chew 81 mg by mouth daily. Swallows whole   atorvastatin (LIPITOR) 80 MG tablet Take 1 tablet (80 mg total) by mouth daily. Please schedule office visit before any future refill.   Blood Glucose Calibration (ACCU-CHEK GUIDE CONTROL) LIQD Use with blood glucose monitor as directed   Blood Glucose Monitoring Suppl (ACCU-CHEK GUIDE) w/Device KIT Use to check blood sugars as directed   Budeson-Glycopyrrol-Formoterol (BREZTRI AEROSPHERE) 160-9-4.8 MCG/ACT AERO Inhale 2 puffs into the lungs in the morning and at bedtime.   celecoxib (CELEBREX) 200 MG capsule Take 1 capsule (200 mg total) by mouth 2 (two) times daily as needed. Home med.   cetirizine (ZYRTEC) 10 MG tablet TAKE 1 TABLET BY MOUTH AT BEDTIME   ELIQUIS 5 MG TABS tablet TAKE 1 TABLET BY MOUTH TWICE A DAY (Patient not taking: Reported on 03/01/2023)   FARXIGA 10 MG TABS tablet TAKE 1 TABLET BY MOUTH DAILY   glucose blood (ACCU-CHEK GUIDE) test strip Use as instructed to check sugar daily for type 2 diabetes.   isosorbide mononitrate (IMDUR) 60 MG 24 hr tablet Take 1-2 tablets (60-120 mg total) by mouth daily. Home med.   linaclotide (LINZESS) 72 MCG capsule Take 72 mcg by mouth daily before breakfast.   lisinopril (ZESTRIL) 10 MG tablet TAKE 1 TABLET BY MOUTH DAILY (Patient taking  differently: Take 10 mg by mouth daily.)   meclizine (ANTIVERT) 25 MG tablet TAKE ONE TABLET BY THREE TIMES A DAY AS NEEDED FOR DIZZINESS   metFORMIN (GLUCOPHAGE-XR) 500 MG 24 hr tablet TAKE 1 TABLET BY MOUTH TWICE A DAY (Patient taking differently: Take 500 mg by mouth 2 (two) times daily with a meal.)   methocarbamol (ROBAXIN) 500 MG tablet Take 500 mg by mouth every 8 (eight) hours as needed for muscle spasms. Take 1 tablet by mouth every hour as needed for muscle spasms   metroNIDAZOLE (METROGEL) 1 % gel Apply topically daily.   naloxone (NARCAN) nasal  spray 4 mg/0.1 mL 1 spray into nostril x1 and may repeat every 2-3 minutes until patient is responsive or EMS arrives   nitroGLYCERIN (NITROSTAT) 0.4 MG SL tablet Place 1 tablet (0.4 mg total) under the tongue every 5 (five) minutes as needed for chest pain.   omega-3 acid ethyl esters (LOVAZA) 1 g capsule Take 4 capsules (4 g total) by mouth daily.   omeprazole (PRILOSEC) 40 MG capsule TAKE 1 CAPSULE BY MOUTH 2 TIMES DAILY 30 MINUTES BEFORE BREAKFAST AND DINNER   oxyCODONE-acetaminophen (PERCOCET) 10-325 MG tablet TAKE 1 TABLET BY MOUTH EVERY 4 HOURS AS NEEDED FOR PAIN   predniSONE (DELTASONE) 10 MG tablet Take 2 tablets daily for 7 days, then reduce to 1 tablet daily   ranolazine (RANEXA) 1000 MG SR tablet Take 1 tablet (1,000 mg total) by mouth 2 (two) times daily.   Suvorexant (BELSOMRA) 10 MG TABS Take 1 tablet (10 mg total) by mouth at bedtime as needed.   torsemide (DEMADEX) 100 MG tablet Take 0.5 tablets (50 mg total) by mouth daily.   traZODone (DESYREL) 150 MG tablet TAKE 1 TABLET BY MOUTH AT BEDTIME   venlafaxine XR (EFFEXOR-XR) 75 MG 24 hr capsule TAKE 1 CAPSULE BY MOUTH DAILY WITH BREAKFAST   No facility-administered encounter medications on file as of 03/10/2023.    Allergies (verified) Prednisone, Demerol  [meperidine hcl], Demerol [meperidine], Jardiance [empagliflozin], Sulfa antibiotics, Albuterol sulfate [albuterol], and Morphine sulfate   History: Past Medical History:  Diagnosis Date   A-fib    Anemia    Anginal pain    Anxiety    Arthritis    Asthma    CAD (coronary artery disease)    a. 2002 CABGx2 (LIMA->LAD, VG->VG->OM1);  b. 09/2012 DES->OM;  c. 03/2015 PTCA of LAD [facility]) in setting of atretic LIMA; d. 05/2015 Cath [facility]): nonobs dzs; e. 06/2015 Cath ([facility]): LM nl, LAD 45p/d ISR, 50d, D1/2 small, LCX 50p/d ISR, OM1 70ost, 30 ISR, VG->OM1 50ost, 44m, LIMA->LAD 99p/d - atretic, RCA dom, nl; f.cath 10/16: 40-50%(FFR 0.90) pLAD, 75% (FFR 0.77) mLAD s/p PCI/DES, oRCA 40%  (FFR0.95)   Cancer    SKIN CANCER ON BACK   Celiac disease    Chronic diastolic CHF (congestive heart failure)    a. 06/2009 Echo: EF 60-65%, Gr 1 DD, triv AI, mildly dil LA, nl RV.   COPD (chronic obstructive pulmonary disease)    a. Chronic bronchitis and emphysema.   DDD (degenerative disc disease), lumbar    Diverticulosis    Dysrhythmia    Essential hypertension    GERD (gastroesophageal reflux disease)    History of hiatal hernia    History of kidney stones    H/O   History of tobacco abuse    a. Quit 2014.   Myocardial infarction 2002   4 STENTS   Pancreatitis    PSVT (paroxysmal supraventricular tachycardia)  a. 10/2012 Noted on Zio Patch.   Sleep apnea    LOST CORD TO CPAP -ONLY 02 @ BEDTIME   Tubular adenoma of colon    Type II diabetes mellitus    Past Surgical History:  Procedure Laterality Date   BYPASS GRAFT     CARDIAC CATHETERIZATION N/A 07/12/2015   rocedure: Left Heart Cath and Cors/Grafts Angiography;  Surgeon: Lyn Records, MD;  Location: [facility] INVASIVE CV LAB;  Service: Cardiovascular;  Laterality: N/A;   CARDIAC CATHETERIZATION Right 10/07/2015   Procedure: Left Heart Cath and Cors/Grafts Angiography;  Surgeon: Laurier Nancy, MD;  Location: [facility] INVASIVE CV LAB;  Service: Cardiovascular;  Laterality: Right;   CARDIAC CATHETERIZATION N/A 04/06/2016   Procedure: Left Heart Cath and Coronary Angiography;  Surgeon: Alwyn Pea, MD;  Location: [facility] INVASIVE CV LAB;  Service: Cardiovascular;  Laterality: N/A;   CARDIAC CATHETERIZATION  04/06/2016   Procedure: Bypass Graft Angiography;  Surgeon: Alwyn Pea, MD;  Location: [facility] INVASIVE CV LAB;  Service: Cardiovascular;;   CARDIAC CATHETERIZATION N/A 11/02/2016   Procedure: Left Heart Cath and Cors/Grafts Angiography and possible PCI;  Surgeon: Alwyn Pea, MD;  Location: [facility] INVASIVE CV LAB;  Service: Cardiovascular;  Laterality: N/A;   CARDIAC CATHETERIZATION N/A 11/02/2016   Procedure:  Coronary Stent Intervention;  Surgeon: Alwyn Pea, MD;  Location: [facility] INVASIVE CV LAB;  Service: Cardiovascular;  Laterality: N/A;   CARDIOVERSION N/A 06/29/2022   Procedure: CARDIOVERSION;  Surgeon: Lamar Blinks, MD;  Location: [facility] ORS;  Service: Cardiovascular;  Laterality: N/A;   CHOLECYSTECTOMY     CIRCUMCISION N/A 06/09/2019   Procedure: CIRCUMCISION ADULT;  Surgeon: Sondra Come, MD;  Location: [facility] ORS;  Service: Urology;  Laterality: N/A;   COLONOSCOPY WITH PROPOFOL N/A 04/01/2018   Procedure: COLONOSCOPY WITH PROPOFOL;  Surgeon: Scot Jun, MD;  Location: [facility] ENDOSCOPY;  Service: Endoscopy;  Laterality: N/A;   ESOPHAGEAL DILATION     ESOPHAGOGASTRODUODENOSCOPY (EGD) WITH PROPOFOL N/A 04/01/2018   Procedure: ESOPHAGOGASTRODUODENOSCOPY (EGD) WITH PROPOFOL;  Surgeon: Scot Jun, MD;  Location: [facility] ENDOSCOPY;  Service: Endoscopy;  Laterality: N/A;   LEFT HEART CATH AND CORS/GRAFTS ANGIOGRAPHY N/A 06/12/2019   Procedure: LEFT HEART CATH AND CORS/GRAFTS ANGIOGRAPHY;  Surgeon: Dalia Heading, MD;  Location: [facility] INVASIVE CV LAB;  Service: Cardiovascular;  Laterality: N/A;   LEFT HEART CATH AND CORS/GRAFTS ANGIOGRAPHY N/A 03/11/2020   Procedure: LEFT HEART CATH AND CORS/GRAFTS ANGIOGRAPHY;  Surgeon: Marcina Millard, MD;  Location: [facility] INVASIVE CV LAB;  Service: Cardiovascular;  Laterality: N/A;   LEFT HEART CATH AND CORS/GRAFTS ANGIOGRAPHY N/A 05/01/2021   Procedure: LEFT HEART CATH AND CORS/GRAFTS ANGIOGRAPHY;  Surgeon: Lamar Blinks, MD;  Location: [facility] INVASIVE CV LAB;  Service: Cardiovascular;  Laterality: N/A;   LEFT HEART CATH AND CORS/GRAFTS ANGIOGRAPHY N/A 11/02/2022   Procedure: LEFT HEART CATH AND CORS/GRAFTS ANGIOGRAPHY;  Surgeon: Alwyn Pea, MD;  Location: [facility] INVASIVE CV LAB;  Service: Cardiovascular;  Laterality: N/A;   TONSILLECTOMY     VASCULAR SURGERY     Family History  Problem Relation Age of Onset   Heart attack Mother     Depression Mother    Heart disease Mother    COPD Mother    Hypertension Mother    Heart attack Father    Diabetes Father    Depression Father    Heart disease Father    Cirrhosis Father    Parkinson's disease Brother    Social History  Socioeconomic History   Marital status: Widowed    Spouse name: Not on file   Number of children: 1   Years of education: Not on file   Highest education level: 10th grade  Occupational History   Occupation: Disabled   Occupation: on Tree surgeon  Tobacco Use   Smoking status: Former    Packs/day: 3.00    Years: 50.00    Additional pack years: 0.00    Total pack years: 150.00    Types: Cigarettes    Quit date: 04/22/2013    Years since quitting: 9.8   Smokeless tobacco: Never   Tobacco comments:    Reports not smoking for approx 8 years.  Vaping Use   Vaping Use: Never used  Substance and Sexual Activity   Alcohol use: No    Comment: remotely quit alcohol use. Hx of heavy alcohol use.   Drug use: Yes    Types: Marijuana    Comment: occasionally   Sexual activity: Not on file  Other Topics Concern   Not on file  Social History Narrative   Pt lives in [place] with wife.  Does not routinely exercise.   Social Determinants of Health   Financial Resource Strain: Low Risk  (03/10/2023)   Overall Financial Resource Strain (CARDIA)    Difficulty of Paying Living Expenses: Not hard at all  Food Insecurity: No Food Insecurity (03/10/2023)   Hunger Vital Sign    Worried About Running Out of Food in the Last Year: Never true    Ran Out of Food in the Last Year: Never true  Transportation Needs: No Transportation Needs (03/10/2023)   PRAPARE - Administrator, Civil Service (Medical): No    Lack of Transportation (Non-Medical): No  Physical Activity: Inactive (03/10/2023)   Exercise Vital Sign    Days of Exercise per Week: 0 days    Minutes of Exercise per Session: 0 min  Stress: No Stress Concern Present (03/10/2023)    [employer] of Occupational Health - Occupational Stress Questionnaire    Feeling of Stress : Not at all  Social Connections: Moderately Isolated (03/10/2023)   Social Connection and Isolation Panel [NHANES]    Frequency of Communication with Friends and Family: More than three times a week    Frequency of Social Gatherings with Friends and Family: More than three times a week    Attends Religious Services: More than 4 times per year    Active Member of [employer] or Organizations: No    Attends Banker Meetings: Not on file    Marital Status: Widowed    Tobacco Counseling Counseling given: Not Answered Tobacco comments: Reports not smoking for approx 8 years.   Clinical Intake:  Pre-visit preparation completed: Yes  Pain : 0-10 Pain Score: 7  Pain Type: Chronic pain Pain Location: Back Pain Orientation: Lower Pain Descriptors / Indicators: Aching Pain Onset: More than a month ago Pain Frequency: Constant Pain Relieving Factors: medications Effect of Pain on Daily Activities: diminishes managing home  Pain Relieving Factors: medications  BMI - recorded: 31.32 Nutritional Status: BMI > 30  Obese Nutritional Risks: None Diabetes: Yes CBG done?: Yes (141 at home this morning) CBG resulted in Enter/ Edit results?: No Did pt. bring in CBG monitor from home?: No  How often do you need to have someone help you when you read instructions, pamphlets, or other written materials from your doctor or pharmacy?: 1 - Never  Diabetic?yes  Interpreter Needed?: No  Comments:  lives alone Information entered by :: B.Abdoulaye Drum,LPN   Activities of Daily Living    03/10/2023    3:26 PM 10/31/2022    6:00 PM  In your present state of health, do you have any difficulty performing the following activities:  Hearing? 0 0  Vision? 0 0  Difficulty concentrating or making decisions? 1 0  Comment trouble remembering things   Walking or climbing stairs? 1 0  Dressing or  bathing? 1 0  Doing errands, shopping? 1 0  Comment has to have Facilities manager and eating ? N   Using the Toilet? N   In the past six months, have you accidently leaked urine? Y   Do you have problems with loss of bowel control? N   Managing your Medications? N   Managing your Finances? N   Housekeeping or managing your Housekeeping? Y   Comment has cleaners once monthly     Patient Care Team: Malva Limes, MD as PCP - General (Family Medicine) Yevonne Pax, MD as Consulting Physician (Pulmonary Disease) Abe People as Physician Assistant Isla Pence, OD as Consulting Physician (Optometry) Lady Gary [name], MD as Consulting Physician (Cardiology) Jeralyn Ruths, MD as Consulting Physician (Oncology)  Indicate any recent Medical Services you may have received from other than [facility] providers in the past year (date may be approximate).     Assessment:   This is a routine wellness examination for Shloima.  Hearing/Vision screen Hearing Screening - Comments:: Adequate hearing Vision Screening - Comments:: Adequate vision w/glasses Dr Clydene Pugh  Dietary issues and exercise activities discussed: Current Exercise Habits: The patient does not participate in regular exercise at present, Exercise limited by: neurologic condition(s);cardiac condition(s);orthopedic condition(s);respiratory conditions(s)   Goals Addressed             This Visit's Progress    DIET - EAT MORE FRUITS AND VEGETABLES   Not on track    DIET - REDUCE PORTION SIZE   On track    Recommend eating 3 small meals a day with two healthy snacks in between.      Increase water intake   On track    Starting 10/27/16, I will increase my water intake to 4-5 glasses a day.     Prevent falls   Not on track    Recommend to remove any items from the home that may cause slips or trips.       Depression Screen    03/10/2023    3:18 PM 09/10/2022    2:00 PM 06/04/2022    1:15  PM 01/08/2022    3:49 PM 03/10/2021   11:03 AM 01/15/2021    9:44 AM 09/24/2020   10:27 AM  PHQ 2/9 Scores  PHQ - 2 Score 0 1 0 0 1 0 0  PHQ- 9 Score  3 0  3  0    Fall Risk    03/10/2023    3:13 PM 09/10/2022    2:00 PM 01/08/2022    3:51 PM 01/15/2021   10:32 AM 05/09/2020    1:48 PM  Fall Risk   Falls in the past year? 1 1 1 1 1   Comment     Fallen over coffe table  Number falls in past yr: 1 1 1 1 1   Comment 8 falls      Injury with Fall? 1 0 0 0 0  Comment ER called to house:a couple of weeks ago;took off blood thinners until  May 1,2024      Risk for fall due to : No Fall Risks History of fall(s) History of fall(s) History of fall(s);Medication side effect   Follow up Education provided;Falls prevention discussed Falls evaluation completed Falls prevention discussed Falls prevention discussed Falls prevention discussed    FALL RISK PREVENTION PERTAINING TO THE HOME:  Any stairs in or around the home? No  If so, are there any without handrails? No  Home free of loose throw rugs in walkways, pet beds, electrical cords, etc? Yes  Adequate lighting in your home to reduce risk of falls? Yes   ASSISTIVE DEVICES UTILIZED TO PREVENT FALLS:  Life alert? yes Use of a cane, walker or w/c? Yes  newly ordered walker he is waiting for Grab bars in the bathroom? Yes  Shower chair or bench in shower? Yes  Elevated toilet seat or a handicapped toilet? No   Cognitive Function:        03/10/2023    3:33 PM 10/27/2016    1:22 PM  6CIT Screen  What Year? 0 points 0 points  What month? 0 points 0 points  What time? 0 points 0 points  Count back from 20 0 points 0 points  Months in reverse 4 points 4 points  Repeat phrase 0 points 4 points  Total Score 4 points 8 points    Immunizations Immunization History  Administered Date(s) Administered   Fluad Quad(high Dose 65+) 09/24/2020, 09/10/2022   Influenza Inj Mdck Quad Pf 08/25/2019   Influenza,inj,Quad PF,6+ Mos 08/02/2015,  07/17/2016, 08/13/2017, 07/27/2018   Influenza-Unspecified 07/27/2018   PFIZER(Purple Top)SARS-COV-2 Vaccination 03/13/2020, 04/03/2020, 10/05/2020   PNEUMOCOCCAL CONJUGATE-20 09/10/2022   Pneumococcal Conjugate-13 05/13/2020   Pneumococcal Polysaccharide-23 05/06/2004, 08/22/2015   Td 10/28/2009, 08/31/2017   Tdap 05/13/2011   Zoster Recombinat (Shingrix) 08/13/2017    TDAP status: Up to date  Flu Vaccine status: Up to date  Pneumococcal vaccine status: Up to date  Covid-19 vaccine status: Completed vaccines  Qualifies for Shingles Vaccine? Yes   Zostavax completed Yes   Shingrix Completed?: Yes  Screening Tests Health Maintenance  Topic Date Due   Zoster Vaccines- Shingrix (2 of 2) 10/08/2017   Diabetic kidney evaluation - Urine ACR  10/27/2017   FOOT EXAM  08/31/2018   OPHTHALMOLOGY EXAM  02/24/2019   COVID-19 Vaccine (4 - 2023-24 season) 07/17/2022   COLONOSCOPY (Pts 45-61yrs Insurance coverage will need to be confirmed)  04/02/2023   INFLUENZA VACCINE  06/17/2023   HEMOGLOBIN A1C  09/07/2023   Lung Cancer Screening  01/27/2024   Diabetic kidney evaluation - eGFR measurement  03/01/2024   Medicare Annual Wellness (AWV)  03/09/2024   DTaP/Tdap/Td (4 - Td or Tdap) 09/01/2027   Pneumonia Vaccine 65+ Years old  Completed   Hepatitis C Screening  Completed   HPV VACCINES  Aged Out    Health Maintenance  Health Maintenance Due  Topic Date Due   Zoster Vaccines- Shingrix (2 of 2) 10/08/2017   Diabetic kidney evaluation - Urine ACR  10/27/2017   FOOT EXAM  08/31/2018   OPHTHALMOLOGY EXAM  02/24/2019   COVID-19 Vaccine (4 - 2023-24 season) 07/17/2022    Colorectal cancer screening: Type of screening: Colonoscopy. Completed yes. Repeat every 5 years scheduled 04/06/2023  Lung Cancer Screening: (Low Dose CT Chest recommended if Age 66-80 years, 30 pack-year currently smoking OR have quit w/in 15years.) does qualify.   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 Is the patient up to date with their annual eye exam?  Yes  Who is the provider or what is the name of the office in which the patient attends annual eye exams? Dr Clydene Pugh If pt is not established with a provider, would they like to be referred to a provider to establish care? No .   Dental Screening: Recommended annual dental exams for proper oral hygiene  Community Resource Referral / Chronic Care Management: CRR required this visit?  No   CCM required this visit?  No     Plan:     I have personally reviewed and noted the following in the patient's chart:   Medical and social history Use of alcohol, tobacco or illicit drugs  Current medications and supplements including opioid prescriptions. Patient is currently taking opioid prescriptions. Information provided to patient regarding non-opioid alternatives. Patient advised to discuss non-opioid treatment plan with their provider. Functional ability and status Nutritional status Physical activity Advanced directives List of other physicians Hospitalizations, surgeries, and ER visits in previous 12 months Vitals Screenings to include cognitive, depression, and falls Referrals and appointments  In addition, I have reviewed and discussed with patient certain preventive protocols, quality metrics, and best practice recommendations. A written personalized care plan for preventive services as well as general preventive health recommendations were provided to patient.     Sue Lush, LPN   1/61/0960   Nurse Notes: Pt relays he is functioning at his baseline of chronic back pain the nest he can. 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 Pt is high fall risk. He states his  balance/gait is off and has had 8 falls in the last year. He relays he is awaiting a walker that was ordered for him pending insurance approval. Pt also relays he has a Pulmonary appt on 04/06/23 for rt lung lesion. He voiced no concerns or questions during the visit.

## 2023-03-10 NOTE — Patient Instructions (Addendum)
[name] , Thank you for taking time to come for your Medicare Wellness Visit. I appreciate your ongoing commitment to your health goals. Please review the following plan we discussed and let me know if I can assist you in the future.   These are the goals we discussed:  Goals      DIET - EAT MORE FRUITS AND VEGETABLES     DIET - REDUCE PORTION SIZE     Recommend eating 3 small meals a day with two healthy snacks in between.      Increase water intake     Starting 10/27/16, I will increase my water intake to 4-5 glasses a day.     Prevent falls     Recommend to remove any items from the home that may cause slips or trips.        This is a list of the screening recommended for you and due dates:  Health Maintenance  Topic Date Due   Zoster (Shingles) Vaccine (2 of 2) 10/08/2017   Yearly kidney health urinalysis for diabetes  10/27/2017   Complete foot exam   08/31/2018   Eye exam for diabetics  02/24/2019   COVID-19 Vaccine (4 - 2023-24 season) 07/17/2022   Colon Cancer Screening  04/02/2023   Flu Shot  06/17/2023   Hemoglobin A1C  09/07/2023   Screening for Lung Cancer  01/27/2024   Yearly kidney function blood test for diabetes  03/01/2024   Medicare Annual Wellness Visit  03/09/2024   DTaP/Tdap/Td vaccine (4 - Td or Tdap) 09/01/2027   Pneumonia Vaccine  Completed   Hepatitis C Screening: USPSTF Recommendation to screen - Ages 48-79 yo.  Completed   HPV Vaccine  Aged Out    Advanced directives: no  Conditions/risks identified: high falls risk  Next appointment: Follow up in one year for your annual wellness visit. 03/14/2024 @ 3:30pm telephone  Preventive Care 65 Years and Older, Male  Preventive care refers to lifestyle choices and visits with your health care provider that can promote health and wellness. What does preventive care include? A yearly physical exam. This is also called an annual well check. Dental exams once or twice a year. Routine eye exams.  Ask your health care provider how often you should have your eyes checked. Personal lifestyle choices, including: Daily care of your teeth and gums. Regular physical activity. Eating a healthy diet. Avoiding tobacco and drug use. Limiting alcohol use. Practicing safe sex. Taking low doses of aspirin every day. Taking vitamin and mineral supplements as recommended by your health care provider. What happens during an annual well check? The services and screenings done by your health care provider during your annual well check will depend on your age, overall health, lifestyle risk factors, and family history of disease. Counseling  Your health care provider may ask you questions about your: Alcohol use. Tobacco use. Drug use. Emotional well-being. Home and relationship well-being. Sexual activity. Eating habits. History of falls. Memory and ability to understand (cognition). Work and work Astronomer. Screening  You may have the following tests or measurements: Height, weight, and BMI. Blood pressure. Lipid and cholesterol levels. These may be checked every 5 years, or more frequently if you are over 50 years old. Skin check. Lung cancer screening. You may have this screening every year starting at age 35 if you have a 30-pack-year history of smoking and currently smoke or have quit within the past 15 years. Fecal occult blood test (FOBT) of the stool. You  may have this test every year starting at age 88. Flexible sigmoidoscopy or colonoscopy. You may have a sigmoidoscopy every 5 years or a colonoscopy every 10 years starting at age 29. Prostate cancer screening. Recommendations will vary depending on your family history and other risks. Hepatitis C blood test. Hepatitis B blood test. Sexually transmitted disease (STD) testing. Diabetes screening. This is done by checking your blood sugar (glucose) after you have not eaten for a while (fasting). You may have this done every 1-3  years. Abdominal aortic aneurysm (AAA) screening. You may need this if you are a current or former smoker. Osteoporosis. You may be screened starting at age 41 if you are at high risk. Talk with your health care provider about your test results, treatment options, and if necessary, the need for more tests. Vaccines  Your health care provider may recommend certain vaccines, such as: Influenza vaccine. This is recommended every year. Tetanus, diphtheria, and acellular pertussis (Tdap, Td) vaccine. You may need a Td booster every 10 years. Zoster vaccine. You may need this after age 58. Pneumococcal 13-valent conjugate (PCV13) vaccine. One dose is recommended after age 23. Pneumococcal polysaccharide (PPSV23) vaccine. One dose is recommended after age 55. Talk to your health care provider about which screenings and vaccines you need and how often you need them. This information is not intended to replace advice given to you by your health care provider. Make sure you discuss any questions you have with your health care provider. Document Released: 11/29/2015 Document Revised: 07/22/2016 Document Reviewed: 09/03/2015 Elsevier Interactive Patient Education  2017 [employer].  Fall Prevention in the Home Falls can cause injuries. They can happen to people of all ages. There are many things you can do to make your home safe and to help prevent falls. What can I do on the outside of my home? Regularly fix the edges of walkways and driveways and fix any cracks. Remove anything that might make you trip as you walk through a door, such as a raised step or threshold. Trim any bushes or trees on the path to your home. Use bright outdoor lighting. Clear any walking paths of anything that might make someone trip, such as rocks or tools. Regularly check to see if handrails are loose or broken. Make sure that both sides of any steps have handrails. Any raised decks and porches should have guardrails on the  edges. Have any leaves, snow, or ice cleared regularly. Use sand or salt on walking paths during winter. Clean up any spills in your garage right away. This includes oil or grease spills. What can I do in the bathroom? Use night lights. Install grab bars by the toilet and in the tub and shower. Do not use towel bars as grab bars. Use non-skid mats or decals in the tub or shower. If you need to sit down in the shower, use a plastic, non-slip stool. Keep the floor dry. Clean up any water that spills on the floor as soon as it happens. Remove soap buildup in the tub or shower regularly. Attach bath mats securely with double-sided non-slip rug tape. Do not have throw rugs and other things on the floor that can make you trip. What can I do in the bedroom? Use night lights. Make sure that you have a light by your bed that is easy to reach. Do not use any sheets or blankets that are too big for your bed. They should not hang down onto the floor. Have a  firm chair that has side arms. You can use this for support while you get dressed. Do not have throw rugs and other things on the floor that can make you trip. What can I do in the kitchen? Clean up any spills right away. Avoid walking on wet floors. Keep items that you use a lot in easy-to-reach places. If you need to reach something above you, use a strong step stool that has a grab bar. Keep electrical cords out of the way. Do not use floor polish or wax that makes floors slippery. If you must use wax, use non-skid floor wax. Do not have throw rugs and other things on the floor that can make you trip. What can I do with my stairs? Do not leave any items on the stairs. Make sure that there are handrails on both sides of the stairs and use them. Fix handrails that are broken or loose. Make sure that handrails are as long as the stairways. Check any carpeting to make sure that it is firmly attached to the stairs. Fix any carpet that is loose or  worn. Avoid having throw rugs at the top or bottom of the stairs. If you do have throw rugs, attach them to the floor with carpet tape. Make sure that you have a light switch at the top of the stairs and the bottom of the stairs. If you do not have them, ask someone to add them for you. What else can I do to help prevent falls? Wear shoes that: Do not have high heels. Have rubber bottoms. Are comfortable and fit you well. Are closed at the toe. Do not wear sandals. If you use a stepladder: Make sure that it is fully opened. Do not climb a closed stepladder. Make sure that both sides of the stepladder are locked into place. Ask someone to hold it for you, if possible. Clearly mark and make sure that you can see: Any grab bars or handrails. First and last steps. Where the edge of each step is. Use tools that help you move around (mobility aids) if they are needed. These include: Canes. Walkers. Scooters. Crutches. Turn on the lights when you go into a dark area. Replace any light bulbs as soon as they burn out. Set up your furniture so you have a clear path. Avoid moving your furniture around. If any of your floors are uneven, fix them. If there are any pets around you, be aware of where they are. Review your medicines with your doctor. Some medicines can make you feel dizzy. This can increase your chance of falling. Ask your doctor what other things that you can do to help prevent falls. This information is not intended to replace advice given to you by your health care provider. Make sure you discuss any questions you have with your health care provider. Document Released: 08/29/2009 Document Revised: 04/09/2016 Document Reviewed: 12/07/2014 Elsevier Interactive Patient Education  2017 [employer].

## 2023-03-11 DIAGNOSIS — I482 Chronic atrial fibrillation, unspecified: Secondary | ICD-10-CM | POA: Diagnosis not present

## 2023-03-11 DIAGNOSIS — D631 Anemia in chronic kidney disease: Secondary | ICD-10-CM | POA: Diagnosis not present

## 2023-03-11 DIAGNOSIS — E1122 Type 2 diabetes mellitus with diabetic chronic kidney disease: Secondary | ICD-10-CM | POA: Diagnosis not present

## 2023-03-11 DIAGNOSIS — N182 Chronic kidney disease, stage 2 (mild): Secondary | ICD-10-CM | POA: Diagnosis not present

## 2023-03-11 DIAGNOSIS — I130 Hypertensive heart and chronic kidney disease with heart failure and stage 1 through stage 4 chronic kidney disease, or unspecified chronic kidney disease: Secondary | ICD-10-CM | POA: Diagnosis not present

## 2023-03-11 DIAGNOSIS — I5032 Chronic diastolic (congestive) heart failure: Secondary | ICD-10-CM | POA: Diagnosis not present

## 2023-03-11 DIAGNOSIS — I700 Atherosclerosis of aorta: Secondary | ICD-10-CM | POA: Diagnosis not present

## 2023-03-11 DIAGNOSIS — I5023 Acute on chronic systolic (congestive) heart failure: Secondary | ICD-10-CM | POA: Diagnosis not present

## 2023-03-11 DIAGNOSIS — J4489 Other specified chronic obstructive pulmonary disease: Secondary | ICD-10-CM | POA: Diagnosis not present

## 2023-03-12 ENCOUNTER — Other Ambulatory Visit: Payer: Self-pay | Admitting: Family Medicine

## 2023-03-12 ENCOUNTER — Other Ambulatory Visit: Payer: Self-pay

## 2023-03-12 ENCOUNTER — Emergency Department: Payer: Medicare HMO

## 2023-03-12 ENCOUNTER — Telehealth: Payer: Self-pay | Admitting: Family Medicine

## 2023-03-12 DIAGNOSIS — J449 Chronic obstructive pulmonary disease, unspecified: Secondary | ICD-10-CM | POA: Insufficient documentation

## 2023-03-12 DIAGNOSIS — Z7901 Long term (current) use of anticoagulants: Secondary | ICD-10-CM | POA: Diagnosis not present

## 2023-03-12 DIAGNOSIS — R0789 Other chest pain: Secondary | ICD-10-CM | POA: Diagnosis not present

## 2023-03-12 DIAGNOSIS — I5023 Acute on chronic systolic (congestive) heart failure: Secondary | ICD-10-CM | POA: Insufficient documentation

## 2023-03-12 DIAGNOSIS — Z7984 Long term (current) use of oral hypoglycemic drugs: Secondary | ICD-10-CM | POA: Insufficient documentation

## 2023-03-12 DIAGNOSIS — E1021 Type 1 diabetes mellitus with diabetic nephropathy: Secondary | ICD-10-CM | POA: Diagnosis not present

## 2023-03-12 DIAGNOSIS — M545 Low back pain, unspecified: Secondary | ICD-10-CM | POA: Diagnosis not present

## 2023-03-12 DIAGNOSIS — R079 Chest pain, unspecified: Secondary | ICD-10-CM | POA: Diagnosis not present

## 2023-03-12 DIAGNOSIS — Z955 Presence of coronary angioplasty implant and graft: Secondary | ICD-10-CM | POA: Insufficient documentation

## 2023-03-12 DIAGNOSIS — Z79899 Other long term (current) drug therapy: Secondary | ICD-10-CM | POA: Insufficient documentation

## 2023-03-12 DIAGNOSIS — I2510 Atherosclerotic heart disease of native coronary artery without angina pectoris: Secondary | ICD-10-CM | POA: Insufficient documentation

## 2023-03-12 DIAGNOSIS — Z7951 Long term (current) use of inhaled steroids: Secondary | ICD-10-CM | POA: Diagnosis not present

## 2023-03-12 DIAGNOSIS — N182 Chronic kidney disease, stage 2 (mild): Secondary | ICD-10-CM | POA: Insufficient documentation

## 2023-03-12 DIAGNOSIS — M48061 Spinal stenosis, lumbar region without neurogenic claudication: Secondary | ICD-10-CM | POA: Diagnosis not present

## 2023-03-12 DIAGNOSIS — M549 Dorsalgia, unspecified: Secondary | ICD-10-CM | POA: Diagnosis not present

## 2023-03-12 DIAGNOSIS — Z7982 Long term (current) use of aspirin: Secondary | ICD-10-CM | POA: Diagnosis not present

## 2023-03-12 DIAGNOSIS — Z85828 Personal history of other malignant neoplasm of skin: Secondary | ICD-10-CM | POA: Insufficient documentation

## 2023-03-12 DIAGNOSIS — I10 Essential (primary) hypertension: Secondary | ICD-10-CM | POA: Diagnosis not present

## 2023-03-12 DIAGNOSIS — J811 Chronic pulmonary edema: Secondary | ICD-10-CM | POA: Diagnosis not present

## 2023-03-12 DIAGNOSIS — J45909 Unspecified asthma, uncomplicated: Secondary | ICD-10-CM | POA: Insufficient documentation

## 2023-03-12 DIAGNOSIS — S3992XA Unspecified injury of lower back, initial encounter: Secondary | ICD-10-CM | POA: Diagnosis not present

## 2023-03-12 DIAGNOSIS — I130 Hypertensive heart and chronic kidney disease with heart failure and stage 1 through stage 4 chronic kidney disease, or unspecified chronic kidney disease: Secondary | ICD-10-CM | POA: Insufficient documentation

## 2023-03-12 DIAGNOSIS — W19XXXA Unspecified fall, initial encounter: Secondary | ICD-10-CM | POA: Diagnosis not present

## 2023-03-12 LAB — COMPREHENSIVE METABOLIC PANEL
ALT: 15 U/L (ref 0–44)
AST: 14 U/L — ABNORMAL LOW (ref 15–41)
Albumin: 3.6 g/dL (ref 3.5–5.0)
Alkaline Phosphatase: 66 U/L (ref 38–126)
Anion gap: 7 (ref 5–15)
BUN: 19 mg/dL (ref 8–23)
CO2: 28 mmol/L (ref 22–32)
Calcium: 9 mg/dL (ref 8.9–10.3)
Chloride: 104 mmol/L (ref 98–111)
Creatinine, Ser: 0.89 mg/dL (ref 0.61–1.24)
GFR, Estimated: >60 mL/min (ref >60)
Glucose, Bld: 178 mg/dL — ABNORMAL HIGH (ref 70–99)
Potassium: 4.2 mmol/L (ref 3.5–5.1)
Sodium: 139 mmol/L (ref 135–145)
Total Bilirubin: 0.9 mg/dL (ref 0.3–1.2)
Total Protein: 6.7 g/dL (ref 6.5–8.1)

## 2023-03-12 LAB — CBC
HCT: 44.8 % (ref 39.0–52.0)
Hemoglobin: 13.7 g/dL (ref 13.0–17.0)
MCH: 27.7 pg (ref 26.0–34.0)
MCHC: 30.6 g/dL (ref 30.0–36.0)
MCV: 90.5 fL (ref 80.0–100.0)
Platelets: 196 10*3/uL (ref 150–400)
RBC: 4.95 MIL/uL (ref 4.22–5.81)
RDW: 16 % — ABNORMAL HIGH (ref 11.5–15.5)
WBC: 8.4 10*3/uL (ref 4.0–10.5)
nRBC: 0 % (ref 0.0–0.2)

## 2023-03-12 LAB — TROPONIN I (HIGH SENSITIVITY): Troponin I (High Sensitivity): 17 ng/L (ref <18)

## 2023-03-12 NOTE — Telephone Encounter (Signed)
Requested medications are due for refill today.  yes  Requested medications are on the active medications list.  yes  Last refill. 10/15/2021 #[id number]  Future visit scheduled.   yes  Notes to clinic.  Missing labs.    Requested Prescriptions  Pending Prescriptions Disp Refills   allopurinol (ZYLOPRIM) 300 MG tablet [Pharmacy Med Name: ALLOPURINOL 300 MG TAB] 180 tablet 4    Sig: TAKE 1 TABLET BY MOUTH TWICE A DAY     Endocrinology:  Gout Agents - allopurinol Failed - 03/12/2023 10:53 AM      Failed - Uric Acid in normal range and within 360 days    No results found for: "POCURA", "LABURIC"       Passed - Cr in normal range and within 360 days    Creatinine  Date Value Ref Range Status  03/07/2015 0.84 mg/dL Final    Comment:    1.61-0.96 NOTE: New Reference Range  01/22/15    Creatinine, Ser  Date Value Ref Range Status  03/02/2023 0.97 0.61 - 1.24 mg/dL Final   Creatinine, POC  Date Value Ref Range Status  10/27/2016 n/a mg/dL Final         Passed - Valid encounter within last 12 months    Recent Outpatient Visits           1 week ago Pleuritic chest pain   [facility] Malva Limes, MD   3 weeks ago Pleuritic chest pain   [facility] Malva Limes, MD   6 months ago Chronic respiratory failure with hypoxia [facility])   [facility] Alfredia Ferguson, PA-C   8 months ago Ataxia   [facility] Malva Limes, MD   9 months ago Toe pain, left   [facility] Merita Norton T, [place]       Future Appointments             In 2 weeks [name], Demetrios Isaacs, MD [name]            Passed - CBC within normal limits and completed in the last 12 months    WBC  Date Value Ref Range Status  03/02/2023 9.0 4.0 - 10.5 K/uL Final   RBC  Date Value Ref Range Status  03/02/2023 4.58 4.22 - 5.81 MIL/uL  Final   Hemoglobin  Date Value Ref Range Status  03/02/2023 12.9 (L) 13.0 - 17.0 g/dL Final  04/54/0981 19.1 (HH) 13.0 - 17.7 g/dL Final    Comment:    Removal of plasma from EDTA tube will result in spuriously elevated CBC results. Redraw and repeat if results do not correlate with patient's clinical condition.    HCT  Date Value Ref Range Status  03/02/2023 41.7 39.0 - 52.0 % Final   Hematocrit  Date Value Ref Range Status  09/24/2020 62.2 (H) 37.5 - 51.0 % Final   MCHC  Date Value Ref Range Status  03/02/2023 30.9 30.0 - 36.0 g/dL Final   [facility]  Date Value Ref Range Status  03/02/2023 28.2 26.0 - 34.0 pg Final   MCV  Date Value Ref Range Status  03/02/2023 91.0 80.0 - 100.0 fL Final  09/24/2020 93 79 - 97 fL Final  03/07/2015 89 80 - 100 fL Final   No results found for: "PLTCOUNTKUC", "LABPLAT", "POCPLA" RDW  Date Value Ref Range Status  03/02/2023 16.5 (H) 11.5 -  15.5 % Final  09/24/2020 13.4 11.6 - 15.4 % Final  03/07/2015 14.1 11.5 - 14.5 % Final         Signed Prescriptions Disp Refills   cetirizine (ZYRTEC) 10 MG tablet 90 tablet 1    Sig: TAKE 1 TABLET BY MOUTH AT BEDTIME     Ear, Nose, and Throat:  Antihistamines 2 Passed - 03/12/2023 10:53 AM      Passed - Cr in normal range and within 360 days    Creatinine  Date Value Ref Range Status  03/07/2015 0.84 mg/dL Final    Comment:    9.81-1.91 NOTE: New Reference Range  01/22/15    Creatinine, Ser  Date Value Ref Range Status  03/02/2023 0.97 0.61 - 1.24 mg/dL Final   Creatinine, POC  Date Value Ref Range Status  10/27/2016 n/a mg/dL Final         Passed - Valid encounter within last 12 months    Recent Outpatient Visits           1 week ago Pleuritic chest pain   [facility] Malva Limes, MD   3 weeks ago Pleuritic chest pain   [facility] Malva Limes, MD   6 months ago Chronic respiratory failure with hypoxia [facility])    [facility] Alfredia Ferguson, PA-C   8 months ago Ataxia   [facility] Malva Limes, MD   9 months ago Toe pain, left   [facility] Merita Norton T, [place]       Future Appointments             In 2 weeks Sherrie Mustache, Demetrios Isaacs, MD [name]

## 2023-03-12 NOTE — Telephone Encounter (Signed)
Requested Prescriptions  Pending Prescriptions Disp Refills   cetirizine (ZYRTEC) 10 MG tablet [Pharmacy Med Name: CETIRIZINE HCL 10 MG TAB] 90 tablet 1    Sig: TAKE 1 TABLET BY MOUTH AT BEDTIME     Ear, Nose, and Throat:  Antihistamines 2 Passed - 03/12/2023 10:53 AM      Passed - Cr in normal range and within 360 days    Creatinine  Date Value Ref Range Status  03/07/2015 0.84 mg/dL Final    Comment:    9.60-4.54 NOTE: New Reference Range  01/22/15    Creatinine, Ser  Date Value Ref Range Status  03/02/2023 0.97 0.61 - 1.24 mg/dL Final   Creatinine, POC  Date Value Ref Range Status  10/27/2016 n/a mg/dL Final         Passed - Valid encounter within last 12 months    Recent Outpatient Visits           1 week ago Pleuritic chest pain   [facility] Malva Limes, MD   3 weeks ago Pleuritic chest pain   [place] [facility] Malva Limes, MD   6 months ago Chronic respiratory failure with hypoxia [facility])   [facility] Alfredia Ferguson, PA-C   8 months ago Ataxia   [facility] Malva Limes, MD   9 months ago Toe pain, left   [facility] Merita Norton T, [place]       Future Appointments             In 2 weeks Sherrie Mustache, Demetrios Isaacs, MD [name]             allopurinol (ZYLOPRIM) 300 MG tablet [Pharmacy Med Name: ALLOPURINOL 300 MG TAB] 180 tablet 4    Sig: TAKE 1 TABLET BY MOUTH TWICE A DAY     Endocrinology:  Gout Agents - allopurinol Failed - 03/12/2023 10:53 AM      Failed - Uric Acid in normal range and within 360 days    No results found for: "POCURA", "LABURIC"       Passed - Cr in normal range and within 360 days    Creatinine  Date Value Ref Range Status  03/07/2015 0.84 mg/dL Final    Comment:    0.98-1.19 NOTE: New Reference Range  01/22/15    Creatinine, Ser  Date Value Ref Range  Status  03/02/2023 0.97 0.61 - 1.24 mg/dL Final   Creatinine, POC  Date Value Ref Range Status  10/27/2016 n/a mg/dL Final         Passed - Valid encounter within last 12 months    Recent Outpatient Visits           1 week ago Pleuritic chest pain   [facility] Malva Limes, MD   3 weeks ago Pleuritic chest pain   [facility] Malva Limes, MD   6 months ago Chronic respiratory failure with hypoxia [facility])   [facility] Alfredia Ferguson, PA-C   8 months ago Ataxia   [facility] Malva Limes, MD   9 months ago Toe pain, left   [facility] Jacky Kindle, [place]       Future Appointments             In 2 weeks Sherrie Mustache, Demetrios Isaacs, MD [name]            Passed - CBC within normal limits and completed in the last 12 months    WBC  Date Value Ref Range Status  03/02/2023 9.0 4.0 - 10.5 K/uL Final   RBC  Date Value Ref Range Status  03/02/2023 4.58 4.22 - 5.81 MIL/uL Final   Hemoglobin  Date Value Ref Range Status  03/02/2023 12.9 (L) 13.0 - 17.0 g/dL Final  54/07/8118 14.7 (HH) 13.0 - 17.7 g/dL Final    Comment:    Removal of plasma from EDTA tube will result in spuriously elevated CBC results. Redraw and repeat if results do not correlate with patient's clinical condition.    HCT  Date Value Ref Range Status  03/02/2023 41.7 39.0 - 52.0 % Final   Hematocrit  Date Value Ref Range Status  09/24/2020 62.2 (H) 37.5 - 51.0 % Final   MCHC  Date Value Ref Range Status  03/02/2023 30.9 30.0 - 36.0 g/dL Final   [facility]  Date Value Ref Range Status  03/02/2023 28.2 26.0 - 34.0 pg Final   MCV  Date Value Ref Range Status  03/02/2023 91.0 80.0 - 100.0 fL Final  09/24/2020 93 79 - 97 fL Final  03/07/2015 89 80 - 100 fL Final   No results found for: "PLTCOUNTKUC", "LABPLAT", "POCPLA" RDW   Date Value Ref Range Status  03/02/2023 16.5 (H) 11.5 - 15.5 % Final  09/24/2020 13.4 11.6 - 15.4 % Final  03/07/2015 14.1 11.5 - 14.5 % Final

## 2023-03-12 NOTE — ED Triage Notes (Signed)
Denies LOC or hitting head during fall.

## 2023-03-12 NOTE — ED Triage Notes (Signed)
BIB [facility] from home. Reports dizziness and fall at home. Reports extensive cardiac hx. Reports chest pain since fall. Eliquis paused for now per cardiologist. Pt alert and oriented following commands. Breathing unlabored speaking in full sentences with symmetric chest rise and fall. Reports cp is a crushing and stabbing pain with radiation to L shoulder. 324 asa given en route.

## 2023-03-12 NOTE — Telephone Encounter (Signed)
[employer] has sent fax to order patient incontinence supplies. Please advise [phone]- [phone]

## 2023-03-13 ENCOUNTER — Emergency Department
Admission: EM | Admit: 2023-03-13 | Discharge: 2023-03-13 | Disposition: A | Discharge status: Home / Self Care | Payer: Medicare HMO | Attending: Emergency Medicine | Admitting: Emergency Medicine

## 2023-03-13 ENCOUNTER — Emergency Department: Payer: Medicare HMO

## 2023-03-13 DIAGNOSIS — M545 Low back pain, unspecified: Secondary | ICD-10-CM

## 2023-03-13 DIAGNOSIS — R0789 Other chest pain: Secondary | ICD-10-CM | POA: Diagnosis not present

## 2023-03-13 DIAGNOSIS — M48061 Spinal stenosis, lumbar region without neurogenic claudication: Secondary | ICD-10-CM | POA: Diagnosis not present

## 2023-03-13 LAB — LIPASE, BLOOD: Lipase: 43 U/L (ref 11–51)

## 2023-03-13 LAB — TROPONIN I (HIGH SENSITIVITY): Troponin I (High Sensitivity): 16 ng/L (ref <18)

## 2023-03-13 MED ORDER — KETOROLAC TROMETHAMINE 30 MG/ML IJ SOLN
15.0000 mg | Freq: Once | INTRAMUSCULAR | Status: AC
  Administered 2023-03-13: 15 mg via INTRAVENOUS
  Filled 2023-03-13: qty 1

## 2023-03-13 MED ORDER — MORPHINE SULFATE (PF) 4 MG/ML IV SOLN
4.0000 mg | Freq: Once | INTRAVENOUS | Status: AC
  Administered 2023-03-13: 4 mg via INTRAVENOUS
  Filled 2023-03-13: qty 1

## 2023-03-13 MED ORDER — ONDANSETRON HCL 4 MG/2ML IJ SOLN
4.0000 mg | Freq: Once | INTRAMUSCULAR | Status: AC
  Administered 2023-03-13: 4 mg via INTRAVENOUS
  Filled 2023-03-13: qty 2

## 2023-03-13 NOTE — Discharge Instructions (Signed)
Return to the ER for worsening symptoms, persistent vomiting, difficulty breathing or other concerns. °

## 2023-03-13 NOTE — ED Notes (Signed)
Pt A&O x4, no obvious distress noted, respirations regular/unlabored. Pt verbalizes understanding of discharge instructions. Pt wheeled to lobby to wait for ride.

## 2023-03-13 NOTE — ED Notes (Signed)
Pt taken to the room via wheelchair and attached to the monitor; primary RN made aware of the patients arrival.    
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
## 2023-03-13 NOTE — ED Notes (Addendum)
Patient transported to XR. 

## 2023-03-16 ENCOUNTER — Ambulatory Visit: Payer: Self-pay | Admitting: *Deleted

## 2023-03-16 MED ORDER — GLIPIZIDE 5 MG PO TABS: 5.0000 mg | ORAL_TABLET | Freq: Every day | ORAL | 0 refills | Status: DC | PRN

## 2023-03-16 NOTE — Telephone Encounter (Signed)
Diannia Ruder called to check status, please advise [facility] manager)

## 2023-03-16 NOTE — Telephone Encounter (Signed)
Patient advised.

## 2023-03-16 NOTE — Telephone Encounter (Signed)
Have sent prescription for glipizide that he can take daily only if sugar is over 300.

## 2023-03-16 NOTE — Telephone Encounter (Signed)
  Chief Complaint: patient is calling with elevated glucose- he didn't feel good today- so he checked BP/glucose Symptoms: jittery, sluggish- patient states his glucose is 308- he did verify the reading- he is not on fluid restriction- so he is going to start hydrating to see if he can get that number down. Offered appointment tomorrow in the office- but due to transportation- he has to schedule out 3 days so he has been scheduled in same day slot for Friday. Patient advised to call back this afternoon to let nurse know his reading and if his symptoms are better- if not he may be directed to do something different Frequency: once today- although patient is not checking daily- last check glucose Sunday- it was normal Pertinent Negatives: Patient denies , fever, frequent urination, difficulty breathing, dizziness, weakness, vomiting Disposition: []ED /[]Urgent Care (no appt availability in office) / [x]Appointment(In office/virtual)/ [] [place] [facility]/ []Home Care/ []Refused Recommended Disposition /[] Mobile Bus/ [] Follow-up with PCP Additional Notes: Appointment has been scheduled for Friday- due to patient transportation- he is to call back- 1-2 hours with another reading after hydrating. Patient advised if PCP has anything to add- office will call him back    Reason for Disposition  [1] Blood glucose > 300 mg/dL (16.7 mmol/L) AND [2] does not  use insulin (e.g., not insulin-dependent; most people with type 2 diabetes)  Answer Assessment - Initial Assessment Questions 1. BLOOD GLUCOSE: "What is your blood glucose level?"      308, Sunday last check- they were pretty good 2. ONSET: "When did you check the blood glucose?"     1:00- 2 hours after eating 3. USUAL RANGE: "What is your glucose level usually?" (e.g., usual fasting morning value, usual evening value)     13 0-160 4. KETONES: "Do you check for ketones (urine or blood test strips)?" If Yes, ask: "What does the test show  now?"      na 5. TYPE 1 or 2:  "Do you know what type of diabetes you have?"  (e.g., Type 1, Type 2, Gestational; doesn't know)      Type 2 6. INSULIN: "Do you take insulin?" "What type of insulin(s) do you use? What is the mode of delivery? (syringe, pen; injection or pump)?"      no 7. DIABETES PILLS: "Do you take any pills for your diabetes?" If Yes, ask: "Have you missed taking any pills recently?"     metformin 8. OTHER SYMPTOMS: "Do you have any symptoms?" (e.g., fever, frequent urination, difficulty breathing, dizziness, weakness, vomiting)     Sluggish, jittery  Protocols used: Diabetes - High Blood Sugar-A-AH

## 2023-03-16 NOTE — Addendum Note (Signed)
Addended by: Malva Limes on: 03/16/2023 02:23 PM   Modules accepted: Orders

## 2023-03-17 DIAGNOSIS — E782 Mixed hyperlipidemia: Secondary | ICD-10-CM | POA: Diagnosis not present

## 2023-03-17 DIAGNOSIS — I480 Paroxysmal atrial fibrillation: Secondary | ICD-10-CM | POA: Diagnosis not present

## 2023-03-17 DIAGNOSIS — Z951 Presence of aortocoronary bypass graft: Secondary | ICD-10-CM | POA: Diagnosis not present

## 2023-03-17 DIAGNOSIS — I5032 Chronic diastolic (congestive) heart failure: Secondary | ICD-10-CM | POA: Diagnosis not present

## 2023-03-17 DIAGNOSIS — I255 Ischemic cardiomyopathy: Secondary | ICD-10-CM | POA: Diagnosis not present

## 2023-03-17 DIAGNOSIS — I25110 Atherosclerotic heart disease of native coronary artery with unstable angina pectoris: Secondary | ICD-10-CM | POA: Diagnosis not present

## 2023-03-17 DIAGNOSIS — I10 Essential (primary) hypertension: Secondary | ICD-10-CM | POA: Diagnosis not present

## 2023-03-17 DIAGNOSIS — Z955 Presence of coronary angioplasty implant and graft: Secondary | ICD-10-CM | POA: Diagnosis not present

## 2023-03-17 DIAGNOSIS — E669 Obesity, unspecified: Secondary | ICD-10-CM | POA: Diagnosis not present

## 2023-03-18 ENCOUNTER — Telehealth: Payer: Self-pay

## 2023-03-18 NOTE — Telephone Encounter (Signed)
Transition Care Management Follow-up Telephone Call Date of discharge and from where: 03/13/2023, [facility] How have you been since you were released from the hospital? Patient stated that he is feeling a little better. Any questions or concerns? No  Items Reviewed: Did the pt receive and understand the discharge instructions provided? Yes  Medications obtained and verified? Yes  Other? No  Any new allergies since your discharge? No  Dietary orders reviewed? Yes Do you have support at home? Yes   Follow up appointments reviewed:  PCP Hospital f/u appt confirmed? Yes  Scheduled to see [name] MD on 03/19/2023 @ 11:20 am [facility] f/u appt confirmed? Yes  Scheduled to see Sheppard Plumber NP on 03/24/2023 @ 2:30 pm [facility]. Are transportation arrangements needed? No  If their condition worsens, is the pt aware to call PCP or go to the Emergency Dept.? Yes Was the patient provided with contact information for the PCP's office or ED? Yes Was to pt encouraged to call back with questions or concerns? Yes  Ayssa Bentivegna [facility]  [facility] Population Health Community Resource Care Guide   [email or web address]  ?? [phone]   Website: [email or web address]  [email or web address]

## 2023-03-19 ENCOUNTER — Encounter: Payer: Self-pay | Admitting: Family Medicine

## 2023-03-19 ENCOUNTER — Ambulatory Visit (INDEPENDENT_AMBULATORY_CARE_PROVIDER_SITE_OTHER): Payer: Medicare HMO | Admitting: Family Medicine

## 2023-03-19 VITALS — BP 151/95 | HR 70 | Ht 67.0 in | Wt 195.0 lb

## 2023-03-19 DIAGNOSIS — E1121 Type 2 diabetes mellitus with diabetic nephropathy: Secondary | ICD-10-CM | POA: Diagnosis not present

## 2023-03-19 DIAGNOSIS — R0781 Pleurodynia: Secondary | ICD-10-CM | POA: Diagnosis not present

## 2023-03-19 DIAGNOSIS — E538 Deficiency of other specified B group vitamins: Secondary | ICD-10-CM

## 2023-03-19 MED ORDER — PREDNISONE 10 MG PO TABS: ORAL_TABLET | ORAL | 0 refills | Status: DC

## 2023-03-19 NOTE — Patient Instructions (Signed)
.   Please review the attached list of medications and notify my office if there are any errors.   . Please bring all of your medications to every appointment so we can make sure that our medication list is the same as yours.   

## 2023-03-19 NOTE — Progress Notes (Signed)
Established patient visit   Patient: Lawrence [name]   DOB: 1956-05-17   67 y.o. Male  MRN: [id number] Visit Date: 03/19/2023  Today's healthcare provider: [name], MD     Subjective    HPI Here today to follow up recurrent chest pain and hyperglycemia. Was found to have 4mm right sided lung nodule in March. Is scheduled for PFTs next week and with Dr. Jayme Cloud 5/21. Has had multiple ER visit for chest pains and repeatedly ruled out for cardiac angina. Was prescribed 6 days prednisone taper in early April and reports pain resolved for several days then returned. Was prescribed lower prolonged course mid April which helped a bit, but continues to have pain every day. He reports pain is exacerbated by coughing and deep breathing.   He was also noted to have low b12 levels on recent labs.  Lab Results  Component Value Date   VITAMINB12 98 (L) 03/08/2023  Has since started OTC B12 supplements.   Medications: Outpatient Medications Prior to Visit  Medication Sig   Accu-Chek Softclix Lancets lancets Use as instructed to check sugar daily for type 2 diabetes.   acetaminophen (TYLENOL) 325 MG tablet Take 2 tablets (650 mg total) by mouth every 4 (four) hours as needed for headache or mild pain.   albuterol (VENTOLIN HFA) 108 (90 Base) MCG/ACT inhaler INHALE 2 PUFFS BY MOUTH EVERY 6 HOURS AS NEEDED FOR SHORTNESS OF BREATH   allopurinol (ZYLOPRIM) 300 MG tablet TAKE 1 TABLET BY MOUTH TWICE A DAY   ALPRAZolam (XANAX) 1 MG tablet Take 1 tablet (1 mg total) by mouth 3 (three) times daily.   amiodarone (PACERONE) 200 MG tablet Take 200 mg by mouth 2 (two) times daily.   aspirin 81 MG chewable tablet Chew 81 mg by mouth daily. Swallows whole   atorvastatin (LIPITOR) 80 MG tablet Take 1 tablet (80 mg total) by mouth daily. Please schedule office visit before any future refill.   Blood Glucose Calibration (ACCU-CHEK GUIDE CONTROL) LIQD Use with blood glucose monitor as directed   Blood  Glucose Monitoring Suppl (ACCU-CHEK GUIDE) w/Device KIT Use to check blood sugars as directed   Budeson-Glycopyrrol-Formoterol (BREZTRI AEROSPHERE) 160-9-4.8 MCG/ACT AERO Inhale 2 puffs into the lungs in the morning and at bedtime.   celecoxib (CELEBREX) 200 MG capsule Take 1 capsule (200 mg total) by mouth 2 (two) times daily as needed. Home med.   cetirizine (ZYRTEC) 10 MG tablet TAKE 1 TABLET BY MOUTH AT BEDTIME   ELIQUIS 5 MG TABS tablet TAKE 1 TABLET BY MOUTH TWICE A DAY   FARXIGA 10 MG TABS tablet TAKE 1 TABLET BY MOUTH DAILY   glipiZIDE (GLUCOTROL) 5 MG tablet Take 1 tablet (5 mg total) by mouth daily as needed (sugar over 300).   glucose blood (ACCU-CHEK GUIDE) test strip Use as instructed to check sugar daily for type 2 diabetes.   isosorbide mononitrate (IMDUR) 60 MG 24 hr tablet Take 1-2 tablets (60-120 mg total) by mouth daily. Home med.   linaclotide (LINZESS) 72 MCG capsule Take 72 mcg by mouth daily before breakfast.   lisinopril (ZESTRIL) 20 MG tablet Take by mouth.   meclizine (ANTIVERT) 25 MG tablet TAKE ONE TABLET BY THREE TIMES A DAY AS NEEDED FOR DIZZINESS   metFORMIN (GLUCOPHAGE-XR) 500 MG 24 hr tablet TAKE 1 TABLET BY MOUTH TWICE A DAY (Patient taking differently: Take 500 mg by mouth 2 (two) times daily with a meal.)   methocarbamol (ROBAXIN) 500 MG  tablet Take 500 mg by mouth every 8 (eight) hours as needed for muscle spasms. Take 1 tablet by mouth every hour as needed for muscle spasms   metoprolol succinate (TOPROL-XL) 100 MG 24 hr tablet Take by mouth.   metroNIDAZOLE (METROGEL) 1 % gel Apply topically daily.   naloxone (NARCAN) nasal spray 4 mg/0.1 mL 1 spray into nostril x1 and may repeat every 2-3 minutes until patient is responsive or EMS arrives   nitroGLYCERIN (NITROSTAT) 0.4 MG SL tablet Place 1 tablet (0.4 mg total) under the tongue every 5 (five) minutes as needed for chest pain.   omega-3 acid ethyl esters (LOVAZA) 1 g capsule Take 4 capsules (4 g total) by  mouth daily.   omeprazole (PRILOSEC) 40 MG capsule TAKE 1 CAPSULE BY MOUTH 2 TIMES DAILY 30 MINUTES BEFORE BREAKFAST AND DINNER   oxyCODONE-acetaminophen (PERCOCET) 10-325 MG tablet TAKE 1 TABLET BY MOUTH EVERY 4 HOURS AS NEEDED FOR PAIN   ranolazine (RANEXA) 1000 MG SR tablet Take 1 tablet (1,000 mg total) by mouth 2 (two) times daily.   Suvorexant (BELSOMRA) 10 MG TABS Take 1 tablet (10 mg total) by mouth at bedtime as needed.   torsemide (DEMADEX) 100 MG tablet Take 0.5 tablets (50 mg total) by mouth daily.   traZODone (DESYREL) 150 MG tablet TAKE 1 TABLET BY MOUTH AT BEDTIME   venlafaxine XR (EFFEXOR-XR) 75 MG 24 hr capsule TAKE 1 CAPSULE BY MOUTH DAILY WITH BREAKFAST   [DISCONTINUED] lisinopril (ZESTRIL) 10 MG tablet TAKE 1 TABLET BY MOUTH DAILY (Patient taking differently: Take 10 mg by mouth daily.)   [DISCONTINUED] predniSONE (DELTASONE) 10 MG tablet Take 2 tablets daily for 7 days, then reduce to 1 tablet daily   No facility-administered medications prior to visit.    Review of Systems  Constitutional:  Negative for appetite change, chills and fever.  Respiratory:  Negative for chest tightness, shortness of breath and wheezing.   Cardiovascular:  Negative for chest pain and palpitations.  Gastrointestinal:  Negative for abdominal pain, nausea and vomiting.       Objective    BP (!) 151/95   Pulse 70   Ht 5\' 7"  (1.702 m)   Wt 195 lb (88.5 kg)   SpO2 98%   BMI 30.54 kg/m    Physical Exam   General: Appearance:    Mildly obese male in no acute distress  Eyes:    PERRL, conjunctiva/corneas clear, EOM's intact       Lungs:     Clear to auscultation bilaterally, respirations unlabored  Heart:    Normal heart rate. Irregularly irregular rhythm. No murmurs, rubs, or gallops.    MS:   All extremities are intact.    Neurologic:   Awake, alert, oriented x 3. No apparent focal neurological defect.         Assessment & Plan     1. Pleuritic chest pain Had briefly responded  very favorable to high dose 6 day prednisone taper. But now back to baseline on low dose prednisone. Counseled that high dose prednisone is not a viable option, but may see more sustained response to longer taper.  - predniSONE (DELTASONE) 10 MG tablet; 6 tablets for 2 days, then 5 for 2 days, then 4 for 2 days, then 3 for 2 days, then 2 for 2 days, then 1 for 2 days.  Dispense: 42 tablet; Refill: 0   Is to follow up with PFTs and pulmonary follow up this month as scheduled.   Has  prescription for glipizide to take prn hyperglycemia  2. B12 deficiency Now on oral b12 supplement and will recheck at follow up appointment in June.   3. Type 2 diabetes mellitus with diabetic nephropathy, without long-term current use of insulin (HCC) Last A1c was reasonably well controlled. Continue current medications, and may take glipizide daily prn hyperglycemia      The entirety of the information documented in the History of Present Illness, Review of Systems and Physical Exam were personally obtained by me. Portions of this information were initially documented by the CMA and reviewed by me for thoroughness and accuracy.     [name], MD  [facility] [phone] (phone) [phone] (fax)  [facility]

## 2023-03-22 ENCOUNTER — Telehealth: Payer: Self-pay

## 2023-03-22 NOTE — Telephone Encounter (Signed)
Copied from CRM [id number]. Topic: General - Inquiry >> Mar 19, 2023  3:42 PM Marlow Baars wrote: Reason for CRM: The patient called in stating that [facility] will not pick up his oxygen tank which he states he doesn't need any longer unless his provider fills out something called a DSL form. Please assist patient further

## 2023-03-24 ENCOUNTER — Encounter (INDEPENDENT_AMBULATORY_CARE_PROVIDER_SITE_OTHER): Payer: Medicare HMO | Admitting: Nurse Practitioner

## 2023-03-24 ENCOUNTER — Encounter (INDEPENDENT_AMBULATORY_CARE_PROVIDER_SITE_OTHER): Payer: Medicare HMO

## 2023-03-25 ENCOUNTER — Ambulatory Visit: Payer: Medicare HMO | Attending: Pulmonary Disease

## 2023-03-25 DIAGNOSIS — J449 Chronic obstructive pulmonary disease, unspecified: Secondary | ICD-10-CM | POA: Diagnosis not present

## 2023-03-25 LAB — PULMONARY FUNCTION TEST ARMC ONLY
DL/VA % pred: 84 %
DL/VA: 3.46 ml/min/mmHg/L
DLCO unc % pred: 76 %
DLCO unc: 18.06 ml/min/mmHg
FEF 25-75 Pre: 1.08 L/sec
FEF2575-%Pred-Pre: 48 %
FEV1-%Pred-Pre: 70 %
FEV1-Pre: 2.05 L
FEV1FVC-%Pred-Pre: 87 %
FEV6-%Pred-Pre: 84 %
FEV6-Pre: 3.15 L
FEV6FVC-%Pred-Pre: 104 %
FVC-%Pred-Pre: 80 %
FVC-Pre: 3.19 L
Pre FEV1/FVC ratio: 64 %
Pre FEV6/FVC Ratio: 99 %
RV % pred: 121 %
RV: 2.75 L
TLC % pred: 98 %
TLC: 6.3 L

## 2023-03-27 ENCOUNTER — Other Ambulatory Visit: Payer: Self-pay

## 2023-03-27 ENCOUNTER — Emergency Department: Payer: Medicare HMO

## 2023-03-27 DIAGNOSIS — J4489 Other specified chronic obstructive pulmonary disease: Secondary | ICD-10-CM | POA: Diagnosis present

## 2023-03-27 DIAGNOSIS — R911 Solitary pulmonary nodule: Secondary | ICD-10-CM | POA: Diagnosis not present

## 2023-03-27 DIAGNOSIS — N182 Chronic kidney disease, stage 2 (mild): Secondary | ICD-10-CM | POA: Diagnosis present

## 2023-03-27 DIAGNOSIS — Z955 Presence of coronary angioplasty implant and graft: Secondary | ICD-10-CM

## 2023-03-27 DIAGNOSIS — Z7951 Long term (current) use of inhaled steroids: Secondary | ICD-10-CM

## 2023-03-27 DIAGNOSIS — Z885 Allergy status to narcotic agent status: Secondary | ICD-10-CM

## 2023-03-27 DIAGNOSIS — Z66 Do not resuscitate: Secondary | ICD-10-CM | POA: Diagnosis not present

## 2023-03-27 DIAGNOSIS — J449 Chronic obstructive pulmonary disease, unspecified: Secondary | ICD-10-CM | POA: Diagnosis not present

## 2023-03-27 DIAGNOSIS — Z951 Presence of aortocoronary bypass graft: Secondary | ICD-10-CM

## 2023-03-27 DIAGNOSIS — I4821 Permanent atrial fibrillation: Secondary | ICD-10-CM | POA: Diagnosis present

## 2023-03-27 DIAGNOSIS — Z820 Family history of epilepsy and other diseases of the nervous system: Secondary | ICD-10-CM | POA: Diagnosis not present

## 2023-03-27 DIAGNOSIS — E785 Hyperlipidemia, unspecified: Secondary | ICD-10-CM | POA: Diagnosis present

## 2023-03-27 DIAGNOSIS — E669 Obesity, unspecified: Secondary | ICD-10-CM | POA: Diagnosis not present

## 2023-03-27 DIAGNOSIS — Z888 Allergy status to other drugs, medicaments and biological substances status: Secondary | ICD-10-CM

## 2023-03-27 DIAGNOSIS — M109 Gout, unspecified: Secondary | ICD-10-CM | POA: Diagnosis present

## 2023-03-27 DIAGNOSIS — I2510 Atherosclerotic heart disease of native coronary artery without angina pectoris: Secondary | ICD-10-CM | POA: Diagnosis not present

## 2023-03-27 DIAGNOSIS — M1990 Unspecified osteoarthritis, unspecified site: Secondary | ICD-10-CM | POA: Diagnosis present

## 2023-03-27 DIAGNOSIS — Z86010 Personal history of colonic polyps: Secondary | ICD-10-CM | POA: Diagnosis not present

## 2023-03-27 DIAGNOSIS — G4733 Obstructive sleep apnea (adult) (pediatric): Secondary | ICD-10-CM | POA: Diagnosis present

## 2023-03-27 DIAGNOSIS — K900 Celiac disease: Secondary | ICD-10-CM | POA: Diagnosis present

## 2023-03-27 DIAGNOSIS — I160 Hypertensive urgency: Principal | ICD-10-CM | POA: Diagnosis present

## 2023-03-27 DIAGNOSIS — I4891 Unspecified atrial fibrillation: Secondary | ICD-10-CM | POA: Diagnosis not present

## 2023-03-27 DIAGNOSIS — Z7901 Long term (current) use of anticoagulants: Secondary | ICD-10-CM

## 2023-03-27 DIAGNOSIS — G8929 Other chronic pain: Secondary | ICD-10-CM | POA: Diagnosis present

## 2023-03-27 DIAGNOSIS — Z833 Family history of diabetes mellitus: Secondary | ICD-10-CM

## 2023-03-27 DIAGNOSIS — I480 Paroxysmal atrial fibrillation: Secondary | ICD-10-CM | POA: Diagnosis not present

## 2023-03-27 DIAGNOSIS — I130 Hypertensive heart and chronic kidney disease with heart failure and stage 1 through stage 4 chronic kidney disease, or unspecified chronic kidney disease: Secondary | ICD-10-CM | POA: Diagnosis present

## 2023-03-27 DIAGNOSIS — M5136 Other intervertebral disc degeneration, lumbar region: Secondary | ICD-10-CM | POA: Diagnosis present

## 2023-03-27 DIAGNOSIS — I5042 Chronic combined systolic (congestive) and diastolic (congestive) heart failure: Secondary | ICD-10-CM | POA: Diagnosis not present

## 2023-03-27 DIAGNOSIS — G9009 Other idiopathic peripheral autonomic neuropathy: Secondary | ICD-10-CM | POA: Diagnosis not present

## 2023-03-27 DIAGNOSIS — K219 Gastro-esophageal reflux disease without esophagitis: Secondary | ICD-10-CM | POA: Diagnosis not present

## 2023-03-27 DIAGNOSIS — E1122 Type 2 diabetes mellitus with diabetic chronic kidney disease: Secondary | ICD-10-CM | POA: Diagnosis present

## 2023-03-27 DIAGNOSIS — Z751 Person awaiting admission to adequate facility elsewhere: Secondary | ICD-10-CM

## 2023-03-27 DIAGNOSIS — Z8249 Family history of ischemic heart disease and other diseases of the circulatory system: Secondary | ICD-10-CM

## 2023-03-27 DIAGNOSIS — Z6830 Body mass index (BMI) 30.0-30.9, adult: Secondary | ICD-10-CM

## 2023-03-27 DIAGNOSIS — I959 Hypotension, unspecified: Secondary | ICD-10-CM | POA: Diagnosis not present

## 2023-03-27 DIAGNOSIS — I25810 Atherosclerosis of coronary artery bypass graft(s) without angina pectoris: Secondary | ICD-10-CM | POA: Diagnosis not present

## 2023-03-27 DIAGNOSIS — R531 Weakness: Secondary | ICD-10-CM | POA: Diagnosis not present

## 2023-03-27 DIAGNOSIS — I25118 Atherosclerotic heart disease of native coronary artery with other forms of angina pectoris: Secondary | ICD-10-CM | POA: Diagnosis not present

## 2023-03-27 DIAGNOSIS — I252 Old myocardial infarction: Secondary | ICD-10-CM

## 2023-03-27 DIAGNOSIS — R55 Syncope and collapse: Secondary | ICD-10-CM | POA: Diagnosis not present

## 2023-03-27 DIAGNOSIS — R0789 Other chest pain: Secondary | ICD-10-CM | POA: Diagnosis not present

## 2023-03-27 DIAGNOSIS — Z825 Family history of asthma and other chronic lower respiratory diseases: Secondary | ICD-10-CM

## 2023-03-27 DIAGNOSIS — D72829 Elevated white blood cell count, unspecified: Secondary | ICD-10-CM | POA: Diagnosis not present

## 2023-03-27 DIAGNOSIS — Z7401 Bed confinement status: Secondary | ICD-10-CM | POA: Diagnosis not present

## 2023-03-27 DIAGNOSIS — Z85828 Personal history of other malignant neoplasm of skin: Secondary | ICD-10-CM

## 2023-03-27 DIAGNOSIS — Z87891 Personal history of nicotine dependence: Secondary | ICD-10-CM

## 2023-03-27 DIAGNOSIS — Z882 Allergy status to sulfonamides status: Secondary | ICD-10-CM

## 2023-03-27 DIAGNOSIS — Z818 Family history of other mental and behavioral disorders: Secondary | ICD-10-CM

## 2023-03-27 DIAGNOSIS — I482 Chronic atrial fibrillation, unspecified: Secondary | ICD-10-CM | POA: Diagnosis not present

## 2023-03-27 DIAGNOSIS — Z9049 Acquired absence of other specified parts of digestive tract: Secondary | ICD-10-CM

## 2023-03-27 DIAGNOSIS — R296 Repeated falls: Secondary | ICD-10-CM | POA: Diagnosis present

## 2023-03-27 DIAGNOSIS — Z7984 Long term (current) use of oral hypoglycemic drugs: Secondary | ICD-10-CM

## 2023-03-27 DIAGNOSIS — Z79899 Other long term (current) drug therapy: Secondary | ICD-10-CM

## 2023-03-27 DIAGNOSIS — I255 Ischemic cardiomyopathy: Secondary | ICD-10-CM | POA: Diagnosis present

## 2023-03-27 DIAGNOSIS — E1169 Type 2 diabetes mellitus with other specified complication: Secondary | ICD-10-CM | POA: Diagnosis not present

## 2023-03-27 DIAGNOSIS — Z7982 Long term (current) use of aspirin: Secondary | ICD-10-CM

## 2023-03-27 DIAGNOSIS — T4275XA Adverse effect of unspecified antiepileptic and sedative-hypnotic drugs, initial encounter: Secondary | ICD-10-CM | POA: Diagnosis not present

## 2023-03-27 DIAGNOSIS — I5032 Chronic diastolic (congestive) heart failure: Secondary | ICD-10-CM | POA: Diagnosis not present

## 2023-03-27 DIAGNOSIS — I10 Essential (primary) hypertension: Secondary | ICD-10-CM | POA: Diagnosis not present

## 2023-03-27 LAB — URINALYSIS, ROUTINE W REFLEX MICROSCOPIC
Bilirubin Urine: NEGATIVE
Glucose, UA: >1000 mg/dL — AB
Hgb urine dipstick: NEGATIVE
Ketones, ur: NEGATIVE mg/dL
Leukocytes,Ua: NEGATIVE
Nitrite: NEGATIVE
Protein, ur: 30 mg/dL — AB
Specific Gravity, Urine: 1.02 (ref 1.005–1.030)
pH: 6 (ref 5.0–8.0)

## 2023-03-27 LAB — BASIC METABOLIC PANEL
Anion gap: 7 (ref 5–15)
BUN: 20 mg/dL (ref 8–23)
CO2: 26 mmol/L (ref 22–32)
Calcium: 8.6 mg/dL — ABNORMAL LOW (ref 8.9–10.3)
Chloride: 104 mmol/L (ref 98–111)
Creatinine, Ser: 0.91 mg/dL (ref 0.61–1.24)
GFR, Estimated: >60 mL/min (ref >60)
Glucose, Bld: 206 mg/dL — ABNORMAL HIGH (ref 70–99)
Potassium: 4.4 mmol/L (ref 3.5–5.1)
Sodium: 137 mmol/L (ref 135–145)

## 2023-03-27 LAB — CBC
HCT: 46.1 % (ref 39.0–52.0)
Hemoglobin: 14.1 g/dL (ref 13.0–17.0)
MCH: 27.3 pg (ref 26.0–34.0)
MCHC: 30.6 g/dL (ref 30.0–36.0)
MCV: 89.3 fL (ref 80.0–100.0)
Platelets: 220 10*3/uL (ref 150–400)
RBC: 5.16 MIL/uL (ref 4.22–5.81)
RDW: 15.9 % — ABNORMAL HIGH (ref 11.5–15.5)
WBC: 9 10*3/uL (ref 4.0–10.5)
nRBC: 0 % (ref 0.0–0.2)

## 2023-03-27 LAB — CBG MONITORING, ED: Glucose-Capillary: 213 mg/dL — ABNORMAL HIGH (ref 70–99)

## 2023-03-27 LAB — TROPONIN I (HIGH SENSITIVITY): Troponin I (High Sensitivity): 18 ng/L — ABNORMAL HIGH (ref <18)

## 2023-03-27 NOTE — ED Triage Notes (Signed)
Pt in by EMS from home passed out in bathroom woke up and called EMS, Afib  Lesion right upper lung, no chest, epigastric pain wwith hx of ulcers  BP 193/112, 80, 98%,

## 2023-03-27 NOTE — ED Triage Notes (Signed)
Pt to ED via [facility] from home c/o LOC. Pt says he was going to bathroom when he passed out. Denies hitting head. Says he was out for about 10 mins. Pt takes eliquis. Pt also complaining of chest pain, Started right after he passed out. Denies SOB, fevers, vomiting

## 2023-03-28 ENCOUNTER — Emergency Department: Payer: Medicare HMO

## 2023-03-28 ENCOUNTER — Inpatient Hospital Stay
Admission: EM | Admit: 2023-03-28 | Discharge: 2023-04-05 | DRG: 305 | Disposition: A | Discharge status: Skilled Nursing Facility | Payer: Medicare HMO | Attending: Internal Medicine | Admitting: Internal Medicine

## 2023-03-28 ENCOUNTER — Observation Stay: Payer: Medicare HMO

## 2023-03-28 DIAGNOSIS — R55 Syncope and collapse: Secondary | ICD-10-CM | POA: Diagnosis not present

## 2023-03-28 DIAGNOSIS — I5042 Chronic combined systolic (congestive) and diastolic (congestive) heart failure: Secondary | ICD-10-CM | POA: Diagnosis present

## 2023-03-28 DIAGNOSIS — I255 Ischemic cardiomyopathy: Secondary | ICD-10-CM

## 2023-03-28 DIAGNOSIS — I10 Essential (primary) hypertension: Secondary | ICD-10-CM | POA: Diagnosis present

## 2023-03-28 DIAGNOSIS — E669 Obesity, unspecified: Secondary | ICD-10-CM | POA: Diagnosis not present

## 2023-03-28 DIAGNOSIS — J449 Chronic obstructive pulmonary disease, unspecified: Secondary | ICD-10-CM | POA: Diagnosis not present

## 2023-03-28 DIAGNOSIS — I482 Chronic atrial fibrillation, unspecified: Secondary | ICD-10-CM | POA: Diagnosis not present

## 2023-03-28 DIAGNOSIS — Z951 Presence of aortocoronary bypass graft: Secondary | ICD-10-CM

## 2023-03-28 DIAGNOSIS — I480 Paroxysmal atrial fibrillation: Secondary | ICD-10-CM | POA: Diagnosis present

## 2023-03-28 DIAGNOSIS — E1121 Type 2 diabetes mellitus with diabetic nephropathy: Secondary | ICD-10-CM | POA: Diagnosis present

## 2023-03-28 DIAGNOSIS — I2510 Atherosclerotic heart disease of native coronary artery without angina pectoris: Secondary | ICD-10-CM | POA: Diagnosis present

## 2023-03-28 DIAGNOSIS — M5410 Radiculopathy, site unspecified: Secondary | ICD-10-CM

## 2023-03-28 DIAGNOSIS — F419 Anxiety disorder, unspecified: Secondary | ICD-10-CM

## 2023-03-28 DIAGNOSIS — I160 Hypertensive urgency: Secondary | ICD-10-CM | POA: Diagnosis not present

## 2023-03-28 DIAGNOSIS — K219 Gastro-esophageal reflux disease without esophagitis: Secondary | ICD-10-CM | POA: Diagnosis present

## 2023-03-28 DIAGNOSIS — E1169 Type 2 diabetes mellitus with other specified complication: Secondary | ICD-10-CM

## 2023-03-28 DIAGNOSIS — R42 Dizziness and giddiness: Secondary | ICD-10-CM

## 2023-03-28 DIAGNOSIS — I5032 Chronic diastolic (congestive) heart failure: Secondary | ICD-10-CM | POA: Diagnosis present

## 2023-03-28 DIAGNOSIS — M109 Gout, unspecified: Secondary | ICD-10-CM | POA: Diagnosis present

## 2023-03-28 DIAGNOSIS — N182 Chronic kidney disease, stage 2 (mild): Secondary | ICD-10-CM | POA: Diagnosis present

## 2023-03-28 DIAGNOSIS — E785 Hyperlipidemia, unspecified: Secondary | ICD-10-CM | POA: Diagnosis present

## 2023-03-28 DIAGNOSIS — I5022 Chronic systolic (congestive) heart failure: Secondary | ICD-10-CM | POA: Diagnosis present

## 2023-03-28 DIAGNOSIS — R079 Chest pain, unspecified: Secondary | ICD-10-CM | POA: Diagnosis present

## 2023-03-28 LAB — URINE DRUG SCREEN, QUALITATIVE (ARMC ONLY)
Amphetamines, Ur Screen: NOT DETECTED
Barbiturates, Ur Screen: NOT DETECTED
Benzodiazepine, Ur Scrn: NOT DETECTED
Cannabinoid 50 Ng, Ur ~~LOC~~: NOT DETECTED
Cocaine Metabolite,Ur ~~LOC~~: NOT DETECTED
MDMA (Ecstasy)Ur Screen: NOT DETECTED
Methadone Scn, Ur: NOT DETECTED
Opiate, Ur Screen: POSITIVE — AB
Phencyclidine (PCP) Ur S: NOT DETECTED
Tricyclic, Ur Screen: NOT DETECTED

## 2023-03-28 LAB — TROPONIN I (HIGH SENSITIVITY): Troponin I (High Sensitivity): 23 ng/L — ABNORMAL HIGH (ref <18)

## 2023-03-28 MED ORDER — METFORMIN HCL ER 500 MG PO TB24: 500.0000 mg | ORAL_TABLET | Freq: Once | ORAL | Status: DC

## 2023-03-28 MED ORDER — MORPHINE SULFATE (PF) 4 MG/ML IV SOLN
4.0000 mg | Freq: Once | INTRAVENOUS | Status: AC
  Administered 2023-03-28: 4 mg via INTRAVENOUS
  Filled 2023-03-28: qty 1

## 2023-03-28 MED ORDER — APIXABAN 5 MG PO TABS
5.0000 mg | ORAL_TABLET | Freq: Two times a day (BID) | ORAL | Status: DC
  Administered 2023-03-28 – 2023-04-05 (×17): 5 mg via ORAL
  Filled 2023-03-28 (×17): qty 1

## 2023-03-28 MED ORDER — ACETAMINOPHEN 325 MG PO TABS
650.0000 mg | ORAL_TABLET | Freq: Once | ORAL | Status: AC
  Administered 2023-03-28: 650 mg via ORAL
  Filled 2023-03-28: qty 2

## 2023-03-28 MED ORDER — NITROGLYCERIN 0.4 MG SL SUBL: 0.4000 mg | SUBLINGUAL_TABLET | SUBLINGUAL | Status: DC | PRN

## 2023-03-28 MED ORDER — OXYCODONE-ACETAMINOPHEN 10-325 MG PO TABS: 1.0000 | ORAL_TABLET | ORAL | Status: DC | PRN

## 2023-03-28 MED ORDER — AMIODARONE HCL 200 MG PO TABS
200.0000 mg | ORAL_TABLET | Freq: Two times a day (BID) | ORAL | Status: DC
  Administered 2023-03-28 – 2023-03-29 (×3): 200 mg via ORAL
  Filled 2023-03-28 (×3): qty 1

## 2023-03-28 MED ORDER — IOHEXOL 350 MG/ML SOLN
75.0000 mL | Freq: Once | INTRAVENOUS | Status: AC | PRN
  Administered 2023-03-28: 75 mL via INTRAVENOUS

## 2023-03-28 MED ORDER — OXYCODONE HCL 5 MG PO TABS
5.0000 mg | ORAL_TABLET | ORAL | Status: DC | PRN
  Administered 2023-03-28 – 2023-04-05 (×17): 5 mg via ORAL
  Filled 2023-03-28 (×17): qty 1

## 2023-03-28 MED ORDER — ALLOPURINOL 100 MG PO TABS
300.0000 mg | ORAL_TABLET | Freq: Two times a day (BID) | ORAL | Status: DC
  Administered 2023-03-28 – 2023-04-05 (×16): 300 mg via ORAL
  Filled 2023-03-28: qty 1
  Filled 2023-03-28 (×4): qty 3
  Filled 2023-03-28: qty 1
  Filled 2023-03-28 (×2): qty 3
  Filled 2023-03-28: qty 1
  Filled 2023-03-28 (×3): qty 3
  Filled 2023-03-28: qty 1
  Filled 2023-03-28: qty 3
  Filled 2023-03-28: qty 1
  Filled 2023-03-28 (×2): qty 3

## 2023-03-28 MED ORDER — TRAZODONE HCL 50 MG PO TABS
150.0000 mg | ORAL_TABLET | Freq: Every evening | ORAL | Status: DC | PRN
  Administered 2023-03-28 – 2023-04-03 (×2): 150 mg via ORAL
  Filled 2023-03-28: qty 3
  Filled 2023-03-28: qty 1

## 2023-03-28 MED ORDER — DAPAGLIFLOZIN PROPANEDIOL 10 MG PO TABS
10.0000 mg | ORAL_TABLET | Freq: Every day | ORAL | Status: DC
  Administered 2023-03-28 – 2023-04-05 (×9): 10 mg via ORAL
  Filled 2023-03-28 (×9): qty 1

## 2023-03-28 MED ORDER — SODIUM CHLORIDE 0.9% FLUSH
3.0000 mL | Freq: Two times a day (BID) | INTRAVENOUS | Status: DC
  Administered 2023-03-28 – 2023-04-05 (×15): 3 mL via INTRAVENOUS

## 2023-03-28 MED ORDER — METOPROLOL SUCCINATE ER 50 MG PO TB24
100.0000 mg | ORAL_TABLET | Freq: Every day | ORAL | Status: DC
  Administered 2023-03-28 – 2023-04-05 (×5): 100 mg via ORAL
  Filled 2023-03-28 (×6): qty 2

## 2023-03-28 MED ORDER — ISOSORBIDE MONONITRATE ER 30 MG PO TB24
60.0000 mg | ORAL_TABLET | Freq: Every day | ORAL | Status: DC
  Administered 2023-03-28 – 2023-04-05 (×5): 60 mg via ORAL
  Filled 2023-03-28 (×2): qty 1
  Filled 2023-03-28 (×3): qty 2
  Filled 2023-03-28: qty 1

## 2023-03-28 MED ORDER — ASPIRIN 81 MG PO CHEW
81.0000 mg | CHEWABLE_TABLET | Freq: Every day | ORAL | Status: DC
  Administered 2023-03-28 – 2023-04-05 (×9): 81 mg via ORAL
  Filled 2023-03-28 (×9): qty 1

## 2023-03-28 MED ORDER — RANOLAZINE ER 500 MG PO TB12
1000.0000 mg | ORAL_TABLET | Freq: Two times a day (BID) | ORAL | Status: DC
  Administered 2023-03-28 – 2023-04-05 (×16): 1000 mg via ORAL
  Filled 2023-03-28 (×17): qty 2

## 2023-03-28 MED ORDER — AMIODARONE HCL 200 MG PO TABS
200.0000 mg | ORAL_TABLET | Freq: Once | ORAL | Status: AC
  Administered 2023-03-28: 200 mg via ORAL
  Filled 2023-03-28: qty 1

## 2023-03-28 MED ORDER — ALPRAZOLAM 0.5 MG PO TABS: 1.0000 mg | ORAL_TABLET | Freq: Three times a day (TID) | ORAL | Status: DC

## 2023-03-28 MED ORDER — ONDANSETRON HCL 4 MG/2ML IJ SOLN
4.0000 mg | Freq: Once | INTRAMUSCULAR | Status: AC
  Administered 2023-03-28: 4 mg via INTRAVENOUS
  Filled 2023-03-28: qty 2

## 2023-03-28 MED ORDER — LISINOPRIL 20 MG PO TABS
20.0000 mg | ORAL_TABLET | Freq: Every day | ORAL | Status: DC
  Administered 2023-03-28 – 2023-04-05 (×4): 20 mg via ORAL
  Filled 2023-03-28 (×2): qty 1
  Filled 2023-03-28 (×2): qty 2
  Filled 2023-03-28: qty 1
  Filled 2023-03-28: qty 2

## 2023-03-28 MED ORDER — PANTOPRAZOLE SODIUM 40 MG PO TBEC
40.0000 mg | DELAYED_RELEASE_TABLET | Freq: Every day | ORAL | Status: DC
  Administered 2023-03-28 – 2023-04-05 (×9): 40 mg via ORAL
  Filled 2023-03-28 (×9): qty 1

## 2023-03-28 MED ORDER — OXYCODONE-ACETAMINOPHEN 5-325 MG PO TABS
1.0000 | ORAL_TABLET | ORAL | Status: DC | PRN
  Administered 2023-03-28 – 2023-04-05 (×19): 1 via ORAL
  Filled 2023-03-28 (×19): qty 1

## 2023-03-28 MED ORDER — HYDRALAZINE HCL 20 MG/ML IJ SOLN
10.0000 mg | Freq: Once | INTRAMUSCULAR | Status: AC
  Administered 2023-03-28: 10 mg via INTRAVENOUS
  Filled 2023-03-28: qty 1

## 2023-03-28 MED ORDER — RANOLAZINE ER 500 MG PO TB12
1000.0000 mg | ORAL_TABLET | Freq: Once | ORAL | Status: AC
  Administered 2023-03-28: 1000 mg via ORAL
  Filled 2023-03-28: qty 2

## 2023-03-28 MED ORDER — TORSEMIDE 20 MG PO TABS
50.0000 mg | ORAL_TABLET | Freq: Every day | ORAL | Status: DC
  Administered 2023-03-28 – 2023-04-05 (×7): 50 mg via ORAL
  Filled 2023-03-28 (×7): qty 3

## 2023-03-28 MED ORDER — ALPRAZOLAM 0.25 MG PO TABS
0.2500 mg | ORAL_TABLET | Freq: Three times a day (TID) | ORAL | Status: DC
  Administered 2023-03-28 – 2023-03-29 (×5): 0.25 mg via ORAL
  Filled 2023-03-28 (×5): qty 1

## 2023-03-28 MED ORDER — LABETALOL HCL 5 MG/ML IV SOLN
20.0000 mg | Freq: Once | INTRAVENOUS | Status: AC
  Administered 2023-03-28: 20 mg via INTRAVENOUS
  Filled 2023-03-28: qty 4

## 2023-03-28 MED ORDER — ATORVASTATIN CALCIUM 20 MG PO TABS
80.0000 mg | ORAL_TABLET | Freq: Every day | ORAL | Status: DC
  Administered 2023-03-28 – 2023-04-05 (×9): 80 mg via ORAL
  Filled 2023-03-28 (×7): qty 4
  Filled 2023-03-28: qty 1
  Filled 2023-03-28: qty 4

## 2023-03-28 NOTE — Assessment & Plan Note (Signed)
Recently restarted on Eliquis May 1 in the setting of resolved thigh hematoma Continue Eliquis and amiodarone Follow 

## 2023-03-28 NOTE — Assessment & Plan Note (Signed)
Continue statin. 

## 2023-03-28 NOTE — H&P (Signed)
History and Physical    Patient: Lawrence [name] WUJ:[id number] DOB: August 09, 1956 DOA: 03/28/2023 DOS: the patient was seen and examined on 03/28/2023 PCP: Malva Limes, MD  Patient coming from: Home  Chief Complaint:  Chief Complaint  Patient presents with   Loss of Consciousness   HPI: Lawrence [name] is a 67 y.o. male with medical history significant of obesity, coronary artery disease status post CABG x 2, history of cardiac stents, hyperlipidemia, chronic diastolic heart failure, hypertension, paroxysmal A-fib, COPD presenting with syncope and hypertensive urgency.  Patient reports syncopal event at home earlier today.  Patient believes he was down for about 10 minutes.  Reports some generalized weakness prior to symptoms.  No focal hemiparesis or confusion.  Denies any chest pain or shortness of breath.  No nausea or vomiting.  Noted to have been admitted April 7 through April 8 for chest pain patient also with noted fall during that admission prior to coming in with right posterior thigh improved with patient's Eliquis being held.  This was recently started back on May 1.  No abdominal pain.  Does report having some left hand numbness intermittently.  Reports compliance with medication regimen.  Dizziness has been a recurring issue over multiple weeks. Presented to the ER afebrile, blood pressures 170s to 200s over 100s to 120s.  White count 9, hemoglobin 14, platelets 220, troponin in the teens to 20s.  Urine drug screen positive for opiates.  CT angio negative for PE.  Noted 6 mm pulmonary nodule in the right middle lobe. Review of Systems: As mentioned in the history of present illness. All other systems reviewed and are negative. Past Medical History:  Diagnosis Date   A-fib (HCC)    Anemia    Anginal pain (HCC)    Anxiety    Arthritis    Asthma    CAD (coronary artery disease)    a. 2002 CABGx2 (LIMA->LAD, VG->VG->OM1);  b. 09/2012 DES->OM;  c. 03/2015 [facility] of LAD [facility]) in  setting of atretic LIMA; d. 05/2015 Cath [facility]): nonobs dzs; e. 06/2015 Cath ([facility]): LM nl, LAD 45p/d ISR, 50d, D1/2 small, LCX 50p/d ISR, OM1 70ost, 30 ISR, VG->OM1 50ost, 64m, LIMA->LAD 99p/d - atretic, RCA dom, nl; f.cath 10/16: 40-50%(FFR 0.90) pLAD, 75% (FFR 0.77) mLAD s/p PCI/DES, oRCA 40% (FFR0.95)   Cancer (HCC)    SKIN CANCER ON BACK   Celiac disease    Chronic diastolic CHF (congestive heart failure) (HCC)    a. 06/2009 Echo: EF 60-65%, Gr 1 DD, triv AI, mildly dil LA, nl RV.   COPD (chronic obstructive pulmonary disease) (HCC)    a. Chronic bronchitis and emphysema.   DDD (degenerative disc disease), lumbar    Diverticulosis    Dysrhythmia    Essential hypertension    GERD (gastroesophageal reflux disease)    History of hiatal hernia    History of kidney stones    H/O   History of tobacco abuse    a. Quit 2014.   Myocardial infarction [facility]) 2002   4 STENTS   Pancreatitis    PSVT (paroxysmal supraventricular tachycardia)    a. 10/2012 Noted on Zio Patch.   Sleep apnea    LOST CORD TO CPAP -ONLY 02 @ BEDTIME   Tubular adenoma of colon    Type II diabetes mellitus (HCC)    Past Surgical History:  Procedure Laterality Date   BYPASS GRAFT     CARDIAC CATHETERIZATION N/A 07/12/2015   rocedure: Left Heart Cath and  Cors/Grafts Angiography;  Surgeon: Lyn Records, MD;  Location: [facility] INVASIVE CV LAB;  Service: Cardiovascular;  Laterality: N/A;   CARDIAC CATHETERIZATION Right 10/07/2015   Procedure: Left Heart Cath and Cors/Grafts Angiography;  Surgeon: Laurier Nancy, MD;  Location: [facility] INVASIVE CV LAB;  Service: Cardiovascular;  Laterality: Right;   CARDIAC CATHETERIZATION N/A 04/06/2016   Procedure: Left Heart Cath and Coronary Angiography;  Surgeon: Alwyn Pea, MD;  Location: [facility] INVASIVE CV LAB;  Service: Cardiovascular;  Laterality: N/A;   CARDIAC CATHETERIZATION  04/06/2016   Procedure: Bypass Graft Angiography;  Surgeon: Alwyn Pea, MD;  Location: [facility] INVASIVE  CV LAB;  Service: Cardiovascular;;   CARDIAC CATHETERIZATION N/A 11/02/2016   Procedure: Left Heart Cath and Cors/Grafts Angiography and possible PCI;  Surgeon: Alwyn Pea, MD;  Location: [facility] INVASIVE CV LAB;  Service: Cardiovascular;  Laterality: N/A;   CARDIAC CATHETERIZATION N/A 11/02/2016   Procedure: Coronary Stent Intervention;  Surgeon: Alwyn Pea, MD;  Location: [facility] INVASIVE CV LAB;  Service: Cardiovascular;  Laterality: N/A;   CARDIOVERSION N/A 06/29/2022   Procedure: CARDIOVERSION;  Surgeon: Lamar Blinks, MD;  Location: [facility] ORS;  Service: Cardiovascular;  Laterality: N/A;   CHOLECYSTECTOMY     CIRCUMCISION N/A 06/09/2019   Procedure: CIRCUMCISION ADULT;  Surgeon: Sondra Come, MD;  Location: [facility] ORS;  Service: Urology;  Laterality: N/A;   COLONOSCOPY WITH PROPOFOL N/A 04/01/2018   Procedure: COLONOSCOPY WITH PROPOFOL;  Surgeon: Scot Jun, MD;  Location: [facility] ENDOSCOPY;  Service: Endoscopy;  Laterality: N/A;   ESOPHAGEAL DILATION     ESOPHAGOGASTRODUODENOSCOPY (EGD) WITH PROPOFOL N/A 04/01/2018   Procedure: ESOPHAGOGASTRODUODENOSCOPY (EGD) WITH PROPOFOL;  Surgeon: Scot Jun, MD;  Location: [facility] ENDOSCOPY;  Service: Endoscopy;  Laterality: N/A;   LEFT HEART CATH AND CORS/GRAFTS ANGIOGRAPHY N/A 06/12/2019   Procedure: LEFT HEART CATH AND CORS/GRAFTS ANGIOGRAPHY;  Surgeon: Dalia Heading, MD;  Location: [facility] INVASIVE CV LAB;  Service: Cardiovascular;  Laterality: N/A;   LEFT HEART CATH AND CORS/GRAFTS ANGIOGRAPHY N/A 03/11/2020   Procedure: LEFT HEART CATH AND CORS/GRAFTS ANGIOGRAPHY;  Surgeon: Marcina Millard, MD;  Location: [facility] INVASIVE CV LAB;  Service: Cardiovascular;  Laterality: N/A;   LEFT HEART CATH AND CORS/GRAFTS ANGIOGRAPHY N/A 05/01/2021   Procedure: LEFT HEART CATH AND CORS/GRAFTS ANGIOGRAPHY;  Surgeon: Lamar Blinks, MD;  Location: [facility] INVASIVE CV LAB;  Service: Cardiovascular;  Laterality: N/A;   LEFT HEART CATH AND CORS/GRAFTS  ANGIOGRAPHY N/A 11/02/2022   Procedure: LEFT HEART CATH AND CORS/GRAFTS ANGIOGRAPHY;  Surgeon: Alwyn Pea, MD;  Location: [facility] INVASIVE CV LAB;  Service: Cardiovascular;  Laterality: N/A;   TONSILLECTOMY     VASCULAR SURGERY     Social History:  reports that he quit smoking about 9 years ago. His smoking use included cigarettes. He has a 150.00 pack-year smoking history. He has never used smokeless tobacco. He reports current drug use. Drug: Marijuana. He reports that he does not drink alcohol.  Allergies  Allergen Reactions   Prednisone Other (See Comments) and Hypertension    Pt states that this medication puts him in A-fib    Demerol  [Meperidine Hcl]    Demerol [Meperidine] Hives   Jardiance [Empagliflozin] Other (See Comments)    Perineal pain   Sulfa Antibiotics Hives   Albuterol Sulfate [Albuterol] Palpitations and Other (See Comments)    Pt currently uses this medication.     Morphine Sulfate Nausea And Vomiting, Rash and Other (See Comments)    Pt states  that he is only allergic to the tablet form of this medication.      Family History  Problem Relation Age of Onset   Heart attack Mother    Depression Mother    Heart disease Mother    COPD Mother    Hypertension Mother    Heart attack Father    Diabetes Father    Depression Father    Heart disease Father    Cirrhosis Father    Parkinson's disease Brother     Prior to Admission medications   Medication Sig Start Date End Date Taking? Authorizing Provider  acetaminophen (TYLENOL) 325 MG tablet Take 2 tablets (650 mg total) by mouth every 4 (four) hours as needed for headache or mild pain. 08/07/22  Yes Rolly Salter, MD  allopurinol (ZYLOPRIM) 300 MG tablet TAKE 1 TABLET BY MOUTH TWICE A DAY 03/13/23  Yes Malva Limes, MD  ALPRAZolam Prudy Feeler) 1 MG tablet Take 1 tablet (1 mg total) by mouth 3 (three) times daily. 02/01/23  Yes Malva Limes, MD  amiodarone (PACERONE) 200 MG tablet Take 200 mg by mouth 2  (two) times daily.   Yes [provider]  aspirin 81 MG chewable tablet Chew 81 mg by mouth daily. Swallows whole   Yes [provider]  atorvastatin (LIPITOR) 80 MG tablet Take 1 tablet (80 mg total) by mouth daily. Please schedule office visit before any future refill. 11/19/22  Yes Malva Limes, MD  Budeson-Glycopyrrol-Formoterol (BREZTRI AEROSPHERE) 160-9-4.8 MCG/ACT AERO Inhale 2 puffs into the lungs in the morning and at bedtime. 02/02/23  Yes [name], MD  celecoxib (CELEBREX) 200 MG capsule Take 1 capsule (200 mg total) by mouth 2 (two) times daily as needed. Home med. 11/03/22  Yes [name], MD  cetirizine (ZYRTEC) 10 MG tablet TAKE 1 TABLET BY MOUTH AT BEDTIME Patient taking differently: Take 10 mg by mouth at bedtime. 03/12/23  Yes [name], Demetrios Isaacs, MD  ELIQUIS 5 MG TABS tablet TAKE 1 TABLET BY MOUTH TWICE A DAY Patient taking differently: Take 5 mg by mouth 2 (two) times daily. 07/30/22  Yes [name], Demetrios Isaacs, MD  FARXIGA 10 MG TABS tablet TAKE 1 TABLET BY MOUTH DAILY Patient taking differently: Take 10 mg by mouth daily. TAKE 1 TABLET BY MOUTH DAILY 03/09/23  Yes Malva Limes, MD  glipiZIDE (GLUCOTROL) 5 MG tablet Take 1 tablet (5 mg total) by mouth daily as needed (sugar over 300). 03/16/23  Yes Malva Limes, MD  isosorbide mononitrate (IMDUR) 60 MG 24 hr tablet Take 1-2 tablets (60-120 mg total) by mouth daily. Home med. 11/03/22  Yes [name], MD  lisinopril (ZESTRIL) 20 MG tablet Take 20 mg by mouth daily. 03/17/23  Yes [provider]  meclizine (ANTIVERT) 25 MG tablet TAKE ONE TABLET BY THREE TIMES A DAY AS NEEDED FOR DIZZINESS 08/13/22  Yes Malva Limes, MD  metFORMIN (GLUCOPHAGE-XR) 500 MG 24 hr tablet TAKE 1 TABLET BY MOUTH TWICE A DAY Patient taking differently: Take 500 mg by mouth 2 (two) times daily with a meal. 10/05/22  Yes [name], Demetrios Isaacs, MD  methocarbamol (ROBAXIN) 500 MG tablet Take 500 mg by mouth every 8 (eight) hours as  needed for muscle spasms. Take 1 tablet by mouth every hour as needed for muscle spasms   Yes [name], Demetrios Isaacs, MD  metoprolol succinate (TOPROL-XL) 100 MG 24 hr tablet Take 100 mg by mouth daily. 03/17/23  Yes [provider]  metroNIDAZOLE (METROGEL) 1 % gel Apply topically daily. 02/15/23  Yes Malva Limes, MD  naloxone [facility]) nasal spray 4 mg/0.1 mL 1 spray into nostril x1 and may repeat every 2-3 minutes until patient is responsive or EMS arrives 08/21/20  Yes Malva Limes, MD  nitroGLYCERIN (NITROSTAT) 0.4 MG SL tablet Place 1 tablet (0.4 mg total) under the tongue every 5 (five) minutes as needed for chest pain. 09/10/22  Yes Drubel, Lillia Abed, PA-C  omega-3 acid ethyl esters (LOVAZA) 1 g capsule Take 4 capsules (4 g total) by mouth daily. 09/10/22  Yes Drubel, Lillia Abed, PA-C  omeprazole (PRILOSEC) 40 MG capsule TAKE 1 CAPSULE BY MOUTH 2 TIMES DAILY 30 MINUTES BEFORE BREAKFAST AND DINNER Patient taking differently: Take 40 mg by mouth 2 (two) times daily. 07/15/22  Yes Malva Limes, MD  oxyCODONE-acetaminophen (PERCOCET) 10-325 MG tablet TAKE 1 TABLET BY MOUTH EVERY 4 HOURS AS NEEDED FOR PAIN Patient taking differently: Take 1 tablet by mouth every 4 (four) hours as needed for pain. for pain 03/03/23  Yes Malva Limes, MD  predniSONE (DELTASONE) 10 MG tablet 6 tablets for 2 days, then 5 for 2 days, then 4 for 2 days, then 3 for 2 days, then 2 for 2 days, then 1 for 2 days. 03/19/23 03/31/23 Yes Malva Limes, MD  ranolazine (RANEXA) 1000 MG SR tablet Take 1 tablet (1,000 mg total) by mouth 2 (two) times daily. 02/22/23  Yes Arnetha Courser, MD  Suvorexant (BELSOMRA) 10 MG TABS Take 1 tablet (10 mg total) by mouth at bedtime as needed. 02/15/23  Yes Malva Limes, MD  torsemide (DEMADEX) 100 MG tablet Take 0.5 tablets (50 mg total) by mouth daily. 09/10/22  Yes Drubel, Lillia Abed, PA-C  traZODone (DESYREL) 150 MG tablet TAKE 1 TABLET BY MOUTH AT BEDTIME Patient taking differently:  Take 150 mg by mouth at bedtime. 11/24/22  Yes Malva Limes, MD  venlafaxine XR (EFFEXOR-XR) 75 MG 24 hr capsule TAKE 1 CAPSULE BY MOUTH DAILY WITH BREAKFAST Patient taking differently: Take 75 mg by mouth daily with breakfast. 02/23/23  Yes Malva Limes, MD  Accu-Chek Softclix Lancets lancets Use as instructed to check sugar daily for type 2 diabetes. 03/03/21   Malva Limes, MD  albuterol (VENTOLIN HFA) 108 (90 Base) MCG/ACT inhaler INHALE 2 PUFFS BY MOUTH EVERY 6 HOURS AS NEEDED FOR SHORTNESS OF BREATH Patient not taking: Reported on 03/28/2023 12/02/21   Malva Limes, MD  Blood Glucose Calibration (ACCU-CHEK GUIDE CONTROL) LIQD Use with blood glucose monitor as directed 02/20/21   Malva Limes, MD  Blood Glucose Monitoring Suppl (ACCU-CHEK GUIDE) w/Device KIT Use to check blood sugars as directed 02/20/21   Malva Limes, MD  glucose blood (ACCU-CHEK GUIDE) test strip Use as instructed to check sugar daily for type 2 diabetes. 03/03/21   Malva Limes, MD  linaclotide Karlene Einstein) 72 MCG capsule Take 72 mcg by mouth daily before breakfast. Patient not taking: Reported on 03/28/2023    [provider]    Physical Exam: Vitals:   03/28/23 0320 03/28/23 0500 03/28/23 0700 03/28/23 0745  BP: (!) 194/111 (!) 184/112 (!) 138/93   Pulse: 77 75 66   Resp: 18 13 13    Temp:      TempSrc:      SpO2: 99% 93%  94%  Weight:      Height:       Physical Exam Constitutional:      Appearance: He is  obese.  HENT:     Head: Normocephalic and atraumatic.     Nose: Nose normal.     Mouth/Throat:     Mouth: Mucous membranes are moist.  Eyes:     Pupils: Pupils are equal, round, and reactive to light.  Cardiovascular:     Rate and Rhythm: Normal rate and regular rhythm.  Pulmonary:     Effort: Pulmonary effort is normal.  Abdominal:     General: Bowel sounds are normal.     Comments: Obese abdomen   Musculoskeletal:        General: Normal range of motion.  Neurological:      Comments: ?  Transient right hand numbness tingling Otherwise grossly nonfocal neuroexam  Psychiatric:        Mood and Affect: Mood normal.     Data Reviewed:  There are no new results to review at this time. Lab Results  Component Value Date   WBC 9.0 03/27/2023   HGB 14.1 03/27/2023   HCT 46.1 03/27/2023   MCV 89.3 03/27/2023   PLT 220 03/27/2023   Last metabolic panel Lab Results  Component Value Date   GLUCOSE 206 (H) 03/27/2023   NA 137 03/27/2023   K 4.4 03/27/2023   CL 104 03/27/2023   CO2 26 03/27/2023   BUN 20 03/27/2023   CREATININE 0.91 03/27/2023   GFRNONAA >60 03/27/2023   CALCIUM 8.6 (L) 03/27/2023   PHOS 2.9 11/03/2022   PROT 6.7 03/12/2023   ALBUMIN 3.6 03/12/2023   LABGLOB 2.5 12/23/2021   AGRATIO 1.6 12/23/2021   BILITOT 0.9 03/12/2023   ALKPHOS 66 03/12/2023   AST 14 (L) 03/12/2023   ALT 15 03/12/2023   ANIONGAP 7 03/27/2023   CT Angio Chest PE W/Cm &/Or Wo Cm CLINICAL DATA:  Pulmonary embolism suspected  EXAM: CT ANGIOGRAPHY CHEST WITH CONTRAST  TECHNIQUE: Multidetector CT imaging of the chest was performed using the standard protocol during bolus administration of intravenous contrast. Multiplanar CT image reconstructions and MIPs were obtained to evaluate the vascular anatomy.  RADIATION DOSE REDUCTION: This exam was performed according to the departmental dose-optimization program which includes automated exposure control, adjustment of the mA and/or kV according to patient size and/or use of iterative reconstruction technique.  CONTRAST:  75mL OMNIPAQUE IOHEXOL 350 MG/ML SOLN  COMPARISON:  01/27/2023  FINDINGS: Cardiovascular: Satisfactory opacification of the pulmonary arteries to the segmental level. No evidence of pulmonary embolism. Normal heart size. No pericardial effusion. Atherosclerosis including the coronary arteries. CABG. Due to timing there is no systemic arterial enhancement.  Mediastinum/Nodes: Negative for  mass or adenopathy  Lungs/Pleura: There is no edema, consolidation, effusion, or pneumothorax. 6 mm average diameter pulmonary nodule in the right middle lobe on 6:77, stable from scan 2 months ago but larger than on a 2021 comparison.  Upper Abdomen: Cholecystectomy. 16 mm nodule in the left adrenal body that is stable from 2022 and consistent with adenoma.  Musculoskeletal: Generalized spondylitic spurring. No acute finding.  Review of the MIP images confirms the above findings.  IMPRESSION: 1. Negative for pulmonary embolism or other acute finding. 2. 6 mm pulmonary nodule in the right middle lobe. Non-contrast chest CT at 6-12 months is recommended. If the nodule is stable at time of repeat CT, then future CT at 18-24 months (from today's scan) is considered optional for low-risk patients, but is recommended for high-risk patients. This recommendation follows the consensus statement: Guidelines for Management of Incidental Pulmonary Nodules Detected on CT Images: From the  Fleischner Society 2017; Radiology 2017; [phone]. 3. Atherosclerosis including the coronary arteries.  Electronically Signed   By: Tiburcio Pea M.D.   On: 03/28/2023 04:14   Assessment and Plan: Syncope Recurring issue with prior admissions for weakness, dizziness and falls in the setting of extensive cardiac history including systolic heart failure, atrial fibrillation, coronary artery disease, diastolic CHF, hypertension Blood pressure 180s to 200s over 100s ?hypertensive urgency as confounding issue ? L hand tingling/numbness-check MRI brain to correlate   2D ECHO 10/2022 w/ EF 50-55% and normal valvular function  Check orthostatics  Will also formally consult cardiology as cardiogenic/cardiovascular source likely predominant etiology of symptoms    Paroxysmal atrial fibrillation with RVR (HCC) Recently restarted on Eliquis May 1 in the setting of resolved thigh hematoma Continue Eliquis  and amiodarone Follow  GERD without esophagitis PPI  Chronic diastolic CHF (congestive heart failure) (HCC) Baseline ischemic cardiomyopathy/ Chronic diastolic CHF 2D echo December 2023 with EF 50 to 55% Appears euvolemic Continue home regimen  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HCC) SSI    Gout Cont allopurinol   Coronary artery disease Baseline CAD s/p CABG x 2  Trop in 20s which appears to be chronic  Will trend trop  Cont home regimen    Dyslipidemia Continue statin  Hypertensive urgency Blood pressure 180s-200s/90s-100s  Continue home regimen including lisinopril and metoprolol As needed IV hydralazine Follow-up cardiology recommendations  CKD (chronic kidney disease) stage 2, GFR 60-89 ml/min Cr 0.9  At baseline    Chronic obstructive pulmonary disease (COPD) (HCC) Stable from respiratory standpoint Continue home inhalers      Advance Care Planning:   Code Status: Full Code   Consults: Cardiology w/ Dr. Darrold Junker   Family Communication: no family at the bedside   Severity of Illness: The appropriate patient status for this patient is INPATIENT. Inpatient status is judged to be reasonable and necessary in order to provide the required intensity of service to ensure the patient's safety. The patient's presenting symptoms, physical exam findings, and initial radiographic and laboratory data in the context of their chronic comorbidities is felt to place them at high risk for further clinical deterioration. Furthermore, it is not anticipated that the patient will be medically stable for discharge from the hospital within 2 midnights of admission.   * I certify that at the point of admission it is my clinical judgment that the patient will require inpatient hospital care spanning beyond 2 midnights from the point of admission due to high intensity of service, high risk for further deterioration and high frequency of surveillance  required.*  Author: Floydene Flock, MD 03/28/2023 9:26 AM  For on call review [email or web address].

## 2023-03-28 NOTE — ED Notes (Signed)
Patient transported to MRI 

## 2023-03-28 NOTE — Consult Note (Signed)
[facility] CLINIC CARDIOLOGY CONSULT NOTE       Patient ID: Lawrence [name] MRN: [id number] DOB/AGE: [id number] 69 y.o.  Admit date: 03/28/2023 Referring Physician Dr. Doree Albee Primary Physician Dr. [name] Primary Cardiologist Dr. Dorothyann Peng Reason for Consultation syncope  HPI: Lawrence [name] is a 33yoM with a PMH of CAD (h/o STEMI s/p CABG x2 LIMA-LAD and SVG-OM1 2002 c/b coronary artery dissection with multiple subsequent PCI and stents), chronic stable angina, HFpEF (50-55%, mild LVH 10/2022), permanent atrial fibrillation s/p unsuccessful DCCV 06/29/2022, COPD (PRN 2L), hx tobacco use, DM2 who presented to [facility] ED 03/28/2023 with syncope and chest pain. Cardiology is consulted for syncope.   The patient is well known to our service from multiple prior hospitalizations. He follows closely outpatient with Dr. Juliann Pares, most recently seen on 03/17/2023. He presents this admission with complaints of chest pain and syncope. States he has had multiple episodes of syncope over the last month. Endorses prodromal symptoms with dizziness, nausea. States he is out of consciousness for a few minutes he thinks.   He is also reporting chest pain located in the left side of his chest and radiating upward which began around 8 PM last night. This is reportedly worse with exertion. He was hypertensive to 207/128 in the ED. Troponins trended 18>23 over 4 hours. He reports that morphine is the only thing that helps his chest pain.   Review of systems complete and found to be negative unless listed above     Past Medical History:  Diagnosis Date   A-fib (HCC)    Anemia    Anginal pain (HCC)    Anxiety    Arthritis    Asthma    CAD (coronary artery disease)    a. 2002 CABGx2 (LIMA->LAD, VG->VG->OM1);  b. 09/2012 DES->OM;  c. 03/2015 PTCA of LAD [facility]) in setting of atretic LIMA; d. 05/2015 Cath [facility]): nonobs dzs; e. 06/2015 Cath ([facility]): LM nl, LAD 45p/d ISR, 50d, D1/2 small, LCX 50p/d ISR, OM1  70ost, 30 ISR, VG->OM1 50ost, 44m, LIMA->LAD 99p/d - atretic, RCA dom, nl; f.cath 10/16: 40-50%(FFR 0.90) pLAD, 75% (FFR 0.77) mLAD s/p PCI/DES, oRCA 40% (FFR0.95)   Cancer (HCC)    SKIN CANCER ON BACK   Celiac disease    Chronic diastolic CHF (congestive heart failure) (HCC)    a. 06/2009 Echo: EF 60-65%, Gr 1 DD, triv AI, mildly dil LA, nl RV.   COPD (chronic obstructive pulmonary disease) (HCC)    a. Chronic bronchitis and emphysema.   DDD (degenerative disc disease), lumbar    Diverticulosis    Dysrhythmia    Essential hypertension    GERD (gastroesophageal reflux disease)    History of hiatal hernia    History of kidney stones    H/O   History of tobacco abuse    a. Quit 2014.   Myocardial infarction [facility]) 2002   4 STENTS   Pancreatitis    PSVT (paroxysmal supraventricular tachycardia)    a. 10/2012 Noted on Zio Patch.   Sleep apnea    LOST CORD TO CPAP -ONLY 02 @ BEDTIME   Tubular adenoma of colon    Type II diabetes mellitus (HCC)     Past Surgical History:  Procedure Laterality Date   BYPASS GRAFT     CARDIAC CATHETERIZATION N/A 07/12/2015   rocedure: Left Heart Cath and Cors/Grafts Angiography;  Surgeon: Lyn Records, MD;  Location: [facility] INVASIVE CV LAB;  Service: Cardiovascular;  Laterality: N/A;   CARDIAC CATHETERIZATION  Right 10/07/2015   Procedure: Left Heart Cath and Cors/Grafts Angiography;  Surgeon: Laurier Nancy, MD;  Location: [facility] INVASIVE CV LAB;  Service: Cardiovascular;  Laterality: Right;   CARDIAC CATHETERIZATION N/A 04/06/2016   Procedure: Left Heart Cath and Coronary Angiography;  Surgeon: Alwyn Pea, MD;  Location: [facility] INVASIVE CV LAB;  Service: Cardiovascular;  Laterality: N/A;   CARDIAC CATHETERIZATION  04/06/2016   Procedure: Bypass Graft Angiography;  Surgeon: Alwyn Pea, MD;  Location: [facility] INVASIVE CV LAB;  Service: Cardiovascular;;   CARDIAC CATHETERIZATION N/A 11/02/2016   Procedure: Left Heart Cath and Cors/Grafts Angiography and  possible PCI;  Surgeon: Alwyn Pea, MD;  Location: [facility] INVASIVE CV LAB;  Service: Cardiovascular;  Laterality: N/A;   CARDIAC CATHETERIZATION N/A 11/02/2016   Procedure: Coronary Stent Intervention;  Surgeon: Alwyn Pea, MD;  Location: [facility] INVASIVE CV LAB;  Service: Cardiovascular;  Laterality: N/A;   CARDIOVERSION N/A 06/29/2022   Procedure: CARDIOVERSION;  Surgeon: Lamar Blinks, MD;  Location: [facility] ORS;  Service: Cardiovascular;  Laterality: N/A;   CHOLECYSTECTOMY     CIRCUMCISION N/A 06/09/2019   Procedure: CIRCUMCISION ADULT;  Surgeon: Sondra Come, MD;  Location: [facility] ORS;  Service: Urology;  Laterality: N/A;   COLONOSCOPY WITH PROPOFOL N/A 04/01/2018   Procedure: COLONOSCOPY WITH PROPOFOL;  Surgeon: Scot Jun, MD;  Location: [facility] ENDOSCOPY;  Service: Endoscopy;  Laterality: N/A;   ESOPHAGEAL DILATION     ESOPHAGOGASTRODUODENOSCOPY (EGD) WITH PROPOFOL N/A 04/01/2018   Procedure: ESOPHAGOGASTRODUODENOSCOPY (EGD) WITH PROPOFOL;  Surgeon: Scot Jun, MD;  Location: [facility] ENDOSCOPY;  Service: Endoscopy;  Laterality: N/A;   LEFT HEART CATH AND CORS/GRAFTS ANGIOGRAPHY N/A 06/12/2019   Procedure: LEFT HEART CATH AND CORS/GRAFTS ANGIOGRAPHY;  Surgeon: Dalia Heading, MD;  Location: [facility] INVASIVE CV LAB;  Service: Cardiovascular;  Laterality: N/A;   LEFT HEART CATH AND CORS/GRAFTS ANGIOGRAPHY N/A 03/11/2020   Procedure: LEFT HEART CATH AND CORS/GRAFTS ANGIOGRAPHY;  Surgeon: Marcina Millard, MD;  Location: [facility] INVASIVE CV LAB;  Service: Cardiovascular;  Laterality: N/A;   LEFT HEART CATH AND CORS/GRAFTS ANGIOGRAPHY N/A 05/01/2021   Procedure: LEFT HEART CATH AND CORS/GRAFTS ANGIOGRAPHY;  Surgeon: Lamar Blinks, MD;  Location: [facility] INVASIVE CV LAB;  Service: Cardiovascular;  Laterality: N/A;   LEFT HEART CATH AND CORS/GRAFTS ANGIOGRAPHY N/A 11/02/2022   Procedure: LEFT HEART CATH AND CORS/GRAFTS ANGIOGRAPHY;  Surgeon: Alwyn Pea, MD;  Location: [facility]  INVASIVE CV LAB;  Service: Cardiovascular;  Laterality: N/A;   TONSILLECTOMY     VASCULAR SURGERY      (Not in a hospital admission)  Social History   Socioeconomic History   Marital status: Widowed    Spouse name: Not on file   Number of children: 1   Years of education: Not on file   Highest education level: 10th grade  Occupational History   Occupation: Disabled   Occupation: on Tree surgeon  Tobacco Use   Smoking status: Former    Packs/day: 3.00    Years: 50.00    Additional pack years: 0.00    Total pack years: 150.00    Types: Cigarettes    Quit date: 04/22/2013    Years since quitting: 9.9   Smokeless tobacco: Never   Tobacco comments:    Reports not smoking for approx 8 years.  Vaping Use   Vaping Use: Never used  Substance and Sexual Activity   Alcohol use: No    Comment: remotely quit alcohol use. Hx of heavy alcohol use.  Drug use: Yes    Types: Marijuana    Comment: occasionally   Sexual activity: Not on file  Other Topics Concern   Not on file  Social History Narrative   Pt lives in [place] with wife.  Does not routinely exercise.   Social Determinants of Health   Financial Resource Strain: Low Risk  (03/10/2023)   Overall Financial Resource Strain (CARDIA)    Difficulty of Paying Living Expenses: Not hard at all  Food Insecurity: No Food Insecurity (03/10/2023)   Hunger Vital Sign    Worried About Running Out of Food in the Last Year: Never true    Ran Out of Food in the Last Year: Never true  Transportation Needs: No Transportation Needs (03/10/2023)   PRAPARE - Administrator, Civil Service (Medical): No    Lack of Transportation (Non-Medical): No  Physical Activity: Inactive (03/10/2023)   Exercise Vital Sign    Days of Exercise per Week: 0 days    Minutes of Exercise per Session: 0 min  Stress: No Stress Concern Present (03/10/2023)   [employer] of Occupational Health - Occupational Stress Questionnaire    Feeling of  Stress : Not at all  Social Connections: Moderately Isolated (03/10/2023)   Social Connection and Isolation Panel [NHANES]    Frequency of Communication with Friends and Family: More than three times a week    Frequency of Social Gatherings with Friends and Family: More than three times a week    Attends Religious Services: More than 4 times per year    Active Member of [employer] or Organizations: No    Attends Banker Meetings: Not on file    Marital Status: Widowed  Intimate Partner Violence: Not At Risk (03/10/2023)   Humiliation, Afraid, Rape, and Kick questionnaire    Fear of Current or Ex-Partner: No    Emotionally Abused: No    Physically Abused: No    Sexually Abused: No    Family History  Problem Relation Age of Onset   Heart attack Mother    Depression Mother    Heart disease Mother    COPD Mother    Hypertension Mother    Heart attack Father    Diabetes Father    Depression Father    Heart disease Father    Cirrhosis Father    Parkinson's disease Brother      Vitals:   03/28/23 0700 03/28/23 0745 03/28/23 1000 03/28/23 1200  BP: (!) 138/93  (!) 138/93 (!) 154/90  Pulse: 66  74 66  Resp: 13  15   Temp:      TempSrc:      SpO2:  94% 94% 95%  Weight:      Height:        PHYSICAL EXAM General: Well appearing, well nourished, in no acute distress. HEENT: Normocephalic and atraumatic. Neck: No JVD.  Lungs: Normal respiratory effort on RA. Clear bilaterally to auscultation. No wheezes, crackles, rhonchi.  Heart: Irregularly irregular. Normal S1 and S2 without gallops or murmurs.  Abdomen: Non-distended appearing.  Msk: Normal strength and tone for age. Extremities: Warm and well perfused. No clubbing, cyanosis. No edema.  Neuro: Alert and oriented X 3. Psych: Answers questions appropriately.   Labs: Basic Metabolic Panel: Recent Labs    03/27/23 2036  NA 137  K 4.4  CL 104  CO2 26  GLUCOSE 206*  BUN 20  CREATININE 0.91  CALCIUM 8.6*    Liver Function Tests: No results for  input(s): "AST", "ALT", "ALKPHOS", "BILITOT", "PROT", "ALBUMIN" in the last 72 hours. No results for input(s): "LIPASE", "AMYLASE" in the last 72 hours. CBC: Recent Labs    03/27/23 2036  WBC 9.0  HGB 14.1  HCT 46.1  MCV 89.3  PLT 220   Cardiac Enzymes: Recent Labs    03/27/23 2036 03/28/23 0054  TROPONINIHS 18* 23*   BNP: No results for input(s): "BNP" in the last 72 hours. D-Dimer: No results for input(s): "DDIMER" in the last 72 hours. Hemoglobin A1C: No results for input(s): "HGBA1C" in the last 72 hours. Fasting Lipid Panel: No results for input(s): "CHOL", "HDL", "LDLCALC", "TRIG", "CHOLHDL", "LDLDIRECT" in the last 72 hours. Thyroid Function Tests: No results for input(s): "TSH", "T4TOTAL", "T3FREE", "THYROIDAB" in the last 72 hours.  Invalid input(s): "FREET3" Anemia Panel: No results for input(s): "VITAMINB12", "FOLATE", "FERRITIN", "TIBC", "IRON", "RETICCTPCT" in the last 72 hours.   Radiology: MR BRAIN WO CONTRAST  Result Date: 03/28/2023 CLINICAL DATA:  Loss of consciousness. EXAM: MRI HEAD WITHOUT CONTRAST TECHNIQUE: Multiplanar, multiecho pulse sequences of the brain and surrounding structures were obtained without intravenous contrast. COMPARISON:  Head CT from yesterday FINDINGS: Brain: No acute infarction, hemorrhage, hydrocephalus, extra-axial collection or mass lesion. Mild chronic small vessel ischemia in the deep cerebral white matter. Vascular: Normal flow voids Skull and upper cervical spine: Normal marrow signal Sinuses/Orbits: Retention cyst in the left nasopharynx. No acute finding. IMPRESSION: No acute or reversible finding. Electronically Signed   By: Tiburcio Pea M.D.   On: 03/28/2023 11:04   CT Angio Chest PE W/Cm &/Or Wo Cm  Result Date: 03/28/2023 CLINICAL DATA:  Pulmonary embolism suspected EXAM: CT ANGIOGRAPHY CHEST WITH CONTRAST TECHNIQUE: Multidetector CT imaging of the chest was performed using  the standard protocol during bolus administration of intravenous contrast. Multiplanar CT image reconstructions and MIPs were obtained to evaluate the vascular anatomy. RADIATION DOSE REDUCTION: This exam was performed according to the departmental dose-optimization program which includes automated exposure control, adjustment of the mA and/or kV according to patient size and/or use of iterative reconstruction technique. CONTRAST:  75mL OMNIPAQUE IOHEXOL 350 MG/ML SOLN COMPARISON:  01/27/2023 FINDINGS: Cardiovascular: Satisfactory opacification of the pulmonary arteries to the segmental level. No evidence of pulmonary embolism. Normal heart size. No pericardial effusion. Atherosclerosis including the coronary arteries. CABG. Due to timing there is no systemic arterial enhancement. Mediastinum/Nodes: Negative for mass or adenopathy Lungs/Pleura: There is no edema, consolidation, effusion, or pneumothorax. 6 mm average diameter pulmonary nodule in the right middle lobe on 6:77, stable from scan 2 months ago but larger than on a 2021 comparison. Upper Abdomen: Cholecystectomy. 16 mm nodule in the left adrenal body that is stable from 2022 and consistent with adenoma. Musculoskeletal: Generalized spondylitic spurring. No acute finding. Review of the MIP images confirms the above findings. IMPRESSION: 1. Negative for pulmonary embolism or other acute finding. 2. 6 mm pulmonary nodule in the right middle lobe. Non-contrast chest CT at 6-12 months is recommended. If the nodule is stable at time of repeat CT, then future CT at 18-24 months (from today's scan) is considered optional for low-risk patients, but is recommended for high-risk patients. This recommendation follows the consensus statement: Guidelines for Management of Incidental Pulmonary Nodules Detected on CT Images: From the Fleischner Society 2017; Radiology 2017; 284:228-243. 3. Atherosclerosis including the coronary arteries. Electronically Signed   By:  Tiburcio Pea M.D.   On: 03/28/2023 04:14   CT HEAD WO CONTRAST  Result Date: 03/27/2023 CLINICAL DATA:  Syncopal episode EXAM: CT HEAD WITHOUT CONTRAST TECHNIQUE: Contiguous axial images were obtained from the base of the skull through the vertex without intravenous contrast. RADIATION DOSE REDUCTION: This exam was performed according to the departmental dose-optimization program which includes automated exposure control, adjustment of the mA and/or kV according to patient size and/or use of iterative reconstruction technique. COMPARISON:  02/26/2023 FINDINGS: Brain: No evidence of acute infarction, hemorrhage, hydrocephalus, extra-axial collection or mass lesion/mass effect. Mild atrophic and ischemic changes are again seen and stable. Vascular: No hyperdense vessel or unexpected calcification. Skull: Normal. Negative for fracture or focal lesion. Sinuses/Orbits: No acute finding. Other: None. IMPRESSION: Chronic atrophic and ischemic changes without acute abnormality. Electronically Signed   By: Alcide Clever M.D.   On: 03/27/2023 21:23   DG Lumbar Spine Complete  Addendum Date: 03/13/2023   ADDENDUM REPORT: 03/13/2023 05:39 ADDENDUM: The inferior endplate in question is L4, not L1. The L1 vertebral body is normal in appearance. Electronically Signed   By: Marin Roberts M.D.   On: 03/13/2023 05:39   Result Date: 03/13/2023 CLINICAL DATA:  Fall.  Low back pain. EXAM: LUMBAR SPINE - COMPLETE 4+ VIEW COMPARISON:  Lumbar spine radiographs 02/26/2023, 02/04/2023, 01/18/2023 and 12/26/2022. FINDINGS: 5 non rib-bearing lumbar type vertebral bodies are present. Grade 1 anterolisthesis at L4-5 is stable. No other significant listhesis is present. Subtle inferior endplate fracture is stable since the prior exam but appears to be new since exams earlier in March in February. Vertebral body heights are otherwise maintained. Atherosclerotic calcifications are present in the aorta without aneurysm.  IMPRESSION: 1. Subtle inferior endplate fracture at L1 appears to be new since March in February. Recommend MRI of the lumbar spine without contrast for further evaluation. If patient cannot tolerate MRI, CT could be used. 2. No acute fracture or significant change since 02/26/2023. 3. Stable grade 1 anterolisthesis at L4-5. 4. Aortic atherosclerosis. Electronically Signed: By: Marin Roberts M.D. On: 03/13/2023 04:55   CT Lumbar Spine Wo Contrast  Result Date: 03/13/2023 CLINICAL DATA:  Abnormal x-ray.  Possible L4 compression fracture. EXAM: CT LUMBAR SPINE WITHOUT CONTRAST TECHNIQUE: Multidetector CT imaging of the lumbar spine was performed without intravenous contrast administration. Multiplanar CT image reconstructions were also generated. RADIATION DOSE REDUCTION: This exam was performed according to the departmental dose-optimization program which includes automated exposure control, adjustment of the mA and/or kV according to patient size and/or use of iterative reconstruction technique. COMPARISON:  Lumbar spine radiographs 03/13/2023. CT of the abdomen and pelvis 05/26/2018 FINDINGS: Segmentation: 5 non rib-bearing lumbar type vertebral bodies are present. The lowest fully formed vertebral body is L5. Alignment: Slight anterolisthesis present at L4-5. No other significant listhesis is present. Lumbar lordosis is preserved. Vertebrae: Inferior endplate changes at L4 are chronic, not significantly changed from the CT of 2019. The appearance on lumbar radiographs 03/13/2023 and 02/26/2023 is likely due to obliquity. A remote inferior endplate fracture at T12 is stable. No acute fractures are present. Chronic endplate sclerotic changes are noted posteriorly at L5-S1. Paraspinal and other soft tissues: Atherosclerotic calcifications are present in the aorta and branch vessels without aneurysm. No solid organ lesions are present. No significant adenopathy is present. Disc levels: L1-2: Mild facet  hypertrophy is present bilaterally. No significant disc protrusion or stenosis is present. L2-3: Mild bilateral facet hypertrophy is present. No focal disc protrusion or stenosis is present. L3-4: Mild facet hypertrophy is present bilaterally. A mild broad-based disc protrusion is present. No significant stenosis is present. L4-5: Moderate facet  hypertrophy present bilaterally. Gas is present in the facet joints. A broad-based disc protrusion is present. This results in moderate central canal stenosis. Mild foraminal narrowing bilaterally is worse on the right. L5-S1: Mild facet hypertrophy is present bilaterally. Mild disc bulging is present without significant focal stenosis. IMPRESSION: 1. Inferior endplate changes at L4 are chronic, not significantly changed from the CT of 2019. The appearance on lumbar radiographs 03/13/2023 and 02/26/2023 is likely due to obliquity. 2. Remote inferior endplate fracture at T12 is stable. 3. No acute or healing fractures. 4. Moderate central canal stenosis and mild foraminal narrowing bilaterally at L4-5 is worse on the right. 5. Mild facet hypertrophy at L3-4 and L5-S1 without significant focal disc protrusion or stenosis. 6.  Aortic Atherosclerosis (ICD10-I70.0). Electronically Signed   By: Marin Roberts M.D.   On: 03/13/2023 05:38   DG Chest 2 View  Result Date: 03/12/2023 CLINICAL DATA:  Chest pain EXAM: CHEST - 2 VIEW COMPARISON:  Chest x-ray 02/21/2023 FINDINGS: Patient is status post cardiac surgery. The heart is enlarged. There central pulmonary vascular congestion. There is no lung consolidation, pleural effusion or pneumothorax. No acute fractures are seen. IMPRESSION: Cardiomegaly with central pulmonary vascular congestion. Electronically Signed   By: Darliss Cheney M.D.   On: 03/12/2023 23:19   CT FEMUR RIGHT WO CONTRAST  Result Date: 03/02/2023 CLINICAL DATA:  Leg pain. Purplish bruising to inner right thigh after fall. EXAM: CT OF THE LOWER RIGHT  EXTREMITY WITHOUT CONTRAST TECHNIQUE: Multidetector CT imaging of the right lower extremity was performed according to the standard protocol. RADIATION DOSE REDUCTION: This exam was performed according to the departmental dose-optimization program which includes automated exposure control, adjustment of the mA and/or kV according to patient size and/or use of iterative reconstruction technique. COMPARISON:  CTA chest abdomen and pelvis 01/27/2023 FINDINGS: Bones/Joint/Cartilage No acute fracture or dislocation.  Trace knee joint effusion. Ligaments Suboptimally assessed by CT. Muscles and Tendons Mild gluteal compartment muscle atrophy.  No acute abnormality. Soft tissues Mild soft tissue edema within the subcutaneous fat overlying the greater trochanter and within the medial thigh. No organized fluid collection. No soft tissue gas. IMPRESSION: 1. Mild soft tissue edema within the subcutaneous fat overlying the greater trochanter and within the medial thigh. In the setting of trauma this is favored to represent contusion. No hematoma. 2. No acute fracture. Electronically Signed   By: Minerva Fester M.D.   On: 03/02/2023 03:25    ECHO 11/02/2022: Left ventricular ejection fraction, by estimation, is 50 to 55%. The left ventricle has low normal function. Left ventricular endocardial border not optimally defined to evaluate regional wall motion. There is mild left ventricular hypertrophy. Left  ventricular diastolic parameters are indeterminate.   2. Right ventricular systolic function is normal. The right ventricular  size is normal. Tricuspid regurgitation signal is inadequate for assessing  PA pressure.   3. Left atrial size was moderately dilated.   4. The mitral valve is normal in structure. Trivial mitral valve  regurgitation. No evidence of mitral stenosis.   5. The aortic valve is normal in structure. Aortic valve regurgitation is  trivial. Aortic valve sclerosis is present, with no evidence of  aortic  valve stenosis.   TELEMETRY reviewed by me [facility]) 03/28/2023 : Rate-controlled AF with rates in the 70s  EKG reviewed by me: Atrial fibrillation, rate controlled at 72 bpm  Data reviewed by me [facility]) 03/28/2023: last 24h vitals tele labs imaging I/O ED provider notes, admission H&P  Active Problems:  Primary hypertension   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HCC)   Chronic obstructive pulmonary disease (COPD) (HCC)   CKD (chronic kidney disease) stage 2, GFR 60-89 ml/min   Hypertensive urgency   Chronic diastolic CHF (congestive heart failure) (HCC)   Dyslipidemia   Coronary artery disease   GERD without esophagitis   Paroxysmal atrial fibrillation with RVR (HCC)   Gout   Syncope    ASSESSMENT AND PLAN: Lawrence [name] is a 89yoM with a PMH of CAD (h/o STEMI s/p CABG x2 LIMA-LAD and SVG-OM1 2002 c/b coronary artery dissection with multiple subsequent PCI and stents), chronic stable angina, HFpEF (50-55%, mild LVH 10/2022), permanent atrial fibrillation s/p unsuccessful DCCV 06/29/2022, COPD (PRN 2L), hx tobacco use, DM2 who presented to [facility] ED 03/28/2023 with syncope and chest pain. Cardiology is consulted for syncope.   # Syncope Patient reported multiple episodes of what sounds like vasovagal syncope per history given. Prodromal episodes with short duration.  -Monitor for bradycardia or concerning arrhythmias on telemetry.  -Monitor electrolytes.   # Chest Pain # Coronary Artery Disease s/p CABG x2 in 2002  # Hypertensive Urgency The patient has a long history of CAD with prior CABG and subsequent stenting, chronic pain. Troponins borderline and flat. EKG without evidence of new ischemia. Inconsistent with ACS. Will continue home anti-anginal therapy.  -Troponins trended 18>23, chest pain likely 2/2 hypertensive urgency -Continue Aspirin 81 mg daily and Atorvastatin 80 mg daily.  -Restart home Imdur 60 mg daily. ?Patient may be taking two tablets of 60 mg at home.   -Continue Metoprolol 100 mg twice daily and Ranexa 1000 mg twice daily.  -minimize use of IV opioids -consider referral to pain management -defer additional cardiac diagnostics at this time.   # chronic HFpEF (50-55%, mild LVH 10/2022)  -Euvolemic on exam. Continue GDMT: imdur, lisinopril 20mg  daily, metoprolol 100 mg twice daily.  -Renal function stable, at baseline. Restart home Torsemide 50 mg once daily and Farxiga 10 mg daily.   #Permanent Atrial Fibrillation - Continue amiodarone at 200mg  twice daily, ?transition to once daily - Continue Metoprolol 100 mg twice daily for rate control.  - Continue eliquis 5mg  BID for stroke prevention    This patient's plan of care was discussed and created with Dr. Darrold Junker and he is in agreement.  Signed: Cheryl Flash, PA-C 03/28/2023, 12:42 PM [facility]

## 2023-03-28 NOTE — Assessment & Plan Note (Signed)
Stable from respiratory standpoint Continue home inhalers 

## 2023-03-28 NOTE — ED Notes (Signed)
Urinal emptied- no further needs

## 2023-03-28 NOTE — Assessment & Plan Note (Signed)
Recently restarted on Eliquis May 1 in the setting of resolved thigh hematoma Continue Eliquis and amiodarone Follow

## 2023-03-28 NOTE — Evaluation (Signed)
Occupational Therapy Evaluation Patient Details Name: Lawrence [name] MRN: [id number] DOB: [birth year] Today's Date: 03/28/2023   History of Present Illness Pt admitted for syncope with fall at home and being unconscious for several minutes. History includes obesity, CAD s/p CABG x 2, HLD, HTN, and COPD. Cleared for mobility per RN.   Clinical Impression   Patient agreeable to OT evaluation. Pt presenting with decreased independence in self care, balance, functional mobility/transfers, and endurance. Pt currently functioning at Mod I for bed mobility, CGA-Min A to stand from EOB, and CGA to take several lateral steps along EOB with CGA + RW. Pt endorsed dizziness upon standing. Orthostatic vitals performed. Pt will benefit from skilled acute OT services to address deficits noted below. OT recommends ongoing therapy upon discharge to maximize safety and independence with ADLs, decrease fall risk, decrease caregiver burden, and promote return to PLOF.   Orthostatic vitals: BP-supine: 140/96 (110) BP-sitting: 153/93 (112) BP-standing: 154/90 (108)   Recommendations for follow up therapy are one component of a multi-disciplinary discharge planning process, led by the attending physician.  Recommendations may be updated based on patient status, additional functional criteria and insurance authorization.   Assistance Recommended at Discharge Intermittent Supervision/Assistance  Patient can return home with the following A lot of help with walking and/or transfers;A lot of help with bathing/dressing/bathroom;Assistance with cooking/housework;Assist for transportation;Help with stairs or ramp for entrance    Functional Status Assessment  Patient has had a recent decline in their functional status and demonstrates the ability to make significant improvements in function in a reasonable and predictable amount of time.  Equipment Recommendations  Other (comment) (defer to next venue of care)     Recommendations for Other Services       Precautions / Restrictions Precautions Precautions: Fall Restrictions Weight Bearing Restrictions: No      Mobility Bed Mobility Overal bed mobility: Modified Independent                  Transfers Overall transfer level: Needs assistance Equipment used: Rolling walker (2 wheels) Transfers: Sit to/from Stand Sit to Stand: Min guard; Min assist           General transfer comment: Min guard for safety 2/2 unsteadiness      Balance Overall balance assessment: History of Falls, Needs assistance Sitting-balance support: Feet supported Sitting balance-Leahy Scale: Good     Standing balance support: Bilateral upper extremity supported, During functional activity Standing balance-Leahy Scale: Fair         ADL either performed or assessed with clinical judgement   ADL Overall ADL's : Needs assistance/impaired Eating/Feeding: Bed level Eating/Feeding Details (indicate cue type and reason): Pt engaged in self-feeding upon arrival, able to use utensils to cut up food Grooming: Set up;Sitting                   Toilet Transfer: Rolling walker (2 wheels);Min guard Toilet Transfer Details (indicate cue type and reason): simulated with STS from EOB         Functional mobility during ADLs: Min guard;Rolling walker (2 wheels) (to take several steps (~93ft) along EOB)       Vision Baseline Vision/History: 1 Wears glasses Patient Visual Report: No change from baseline       Perception     Praxis      Pertinent Vitals/Pain Pain Assessment Pain Assessment: No/denies pain     Hand Dominance Right   Extremity/Trunk Assessment Upper Extremity Assessment Upper Extremity Assessment: Overall WFL for tasks  assessed   Lower Extremity Assessment Lower Extremity Assessment: Generalized weakness       Communication Communication Communication: No difficulties   Cognition Arousal/Alertness:  Awake/alert Behavior During Therapy: WFL for tasks assessed/performed Overall Cognitive Status: Within Functional Limits for tasks assessed     General Comments       Exercises Other Exercises Other Exercises: OT provided education re: role of OT, OT POC, post acute recs, sitting up for all meals, EOB/OOB mobility with assistance, home/fall safety.     Shoulder Instructions      Home Living Family/patient expects to be discharged to:: Private residence Living Arrangements: Alone Available Help at Discharge: Family;Available PRN/intermittently;Friend(s) Type of Home: Apartment Home Access: Level entry;Ramped entrance     Home Layout: One level     Bathroom Shower/Tub: Producer, television/film/video: Standard     Home Equipment: Shower seat - built in;Rollator (4 wheels)   Additional Comments: O2 at night (3L)      Prior Functioning/Environment Prior Level of Function : Independent/Modified Independent;History of Falls (last six months)             Mobility Comments: Mod I using rollator, >4 falls since Feb 2024. ADLs Comments: Pt reports IND for ADLs/IADLs. Pt not driving, receives medical transport to appointments and friend takes him to grocery store.        OT Problem List: Decreased strength;Decreased activity tolerance;Impaired balance (sitting and/or standing)      OT Treatment/Interventions: Self-care/ADL training;Therapeutic exercise;Energy conservation;DME and/or AE instruction;Therapeutic activities;Patient/family education;Balance training    OT Goals(Current goals can be found in the care plan section) Acute Rehab OT Goals Patient Stated Goal: get stronger OT Goal Formulation: With patient Time For Goal Achievement: 04/11/23 Potential to Achieve Goals: Good   OT Frequency: Min 1X/week    Co-evaluation              AM-PAC OT "6 Clicks" Daily Activity     Outcome Measure Help from another person eating meals?: None Help from another  person taking care of personal grooming?: A Little Help from another person toileting, which includes using toliet, bedpan, or urinal?: A Lot Help from another person bathing (including washing, rinsing, drying)?: A Lot Help from another person to put on and taking off regular upper body clothing?: A Little Help from another person to put on and taking off regular lower body clothing?: A Lot 6 Click Score: 16   End of Session Equipment Utilized During Treatment: Gait belt;Rolling walker (2 wheels) Nurse Communication: Mobility status;Other (comment) (BP)  Activity Tolerance: Patient tolerated treatment well;Other (comment) (elevated BP) Patient left: in bed;with call bell/phone within reach  OT Visit Diagnosis: Unsteadiness on feet (R26.81);Repeated falls (R29.6);Muscle weakness (generalized) (M62.81)                Time: 1610-9604 OT Time Calculation (min): 12 min Charges:  OT General Charges $OT Visit: 1 Visit OT Evaluation $OT Eval Low Complexity: 1 Low  [facility] MS, OTR/L ascom [phone]  03/28/23, 2:32 PM

## 2023-03-28 NOTE — Assessment & Plan Note (Signed)
SSI

## 2023-03-28 NOTE — Evaluation (Signed)
Physical Therapy Evaluation Patient Details Name: Lawrence [name] MRN: [id number] DOB: [birth year] Today's Date: 03/28/2023  History of Present Illness  Pt admitted for syncope with fall at home and being unconscious for several minutes. History includes obesity, CAD s/p CABG x 2, HLD, HTN, and COPD. Cleared for mobility per RN.  Clinical Impression  Pt is a pleasant 67 year old male who was admitted for syncope. Pt performs bed mobility with mod I and transfers with min assist and HHA. Unable to further ambulate this date. Pt reports +dizziness with standing and increased chest pain. RN notified. Orthostatics performed. Pt reports multiple recent falls. Anticipate pt will be able to mobilize with RW, however appears unsafe in home environment without supervision at this time. Pt demonstrates deficits with strength/mobility/balance. Would benefit from skilled PT to address above deficits and promote optimal return to PLOF. Pt will continue to receive skilled PT services while admitted and will defer to TOC/care team for updates regarding disposition planning.  Orthostatic VS for the past 24 hrs:  BP- Lying BP- Sitting BP- Standing at 0 minutes  03/28/23 1017 (!) 160/97 (!) 155/105 149/90         Recommendations for follow up therapy are one component of a multi-disciplinary discharge planning process, led by the attending physician.  Recommendations may be updated based on patient status, additional functional criteria and insurance authorization.  Follow Up Recommendations Can patient physically be transported by private vehicle: No     Assistance Recommended at Discharge Intermittent Supervision/Assistance  Patient can return home with the following  A lot of help with walking and/or transfers;A lot of help with bathing/dressing/bathroom;Help with stairs or ramp for entrance;Assist for transportation    Equipment Recommendations  (TBD)  Recommendations for Other Services        Functional Status Assessment Patient has had a recent decline in their functional status and demonstrates the ability to make significant improvements in function in a reasonable and predictable amount of time.     Precautions / Restrictions Precautions Precautions: Fall Restrictions Weight Bearing Restrictions: No      Mobility  Bed Mobility Overal bed mobility: Modified Independent             General bed mobility comments: safe technique with ease of mobility    Transfers Overall transfer level: Needs assistance Equipment used: None Transfers: Sit to/from Stand Sit to Stand: Min assist           General transfer comment: shaky and unsteady during standing attempts. Unable to locate RW for further mobility trial. Orthostatics performed with negative results. MRI tech entered room, assisted pt back on stretcher    Ambulation/Gait               General Gait Details: unable to fully access this date  Stairs            Wheelchair Mobility    Modified Rankin (Stroke Patients Only)       Balance Overall balance assessment: History of Falls, Needs assistance Sitting-balance support: Feet supported Sitting balance-Leahy Scale: Good     Standing balance support: No upper extremity supported Standing balance-Leahy Scale: Poor                               Pertinent Vitals/Pain Pain Assessment Pain Assessment: 0-10 Pain Score: 8  Pain Location: chest pain- RN notified Pain Descriptors / Indicators: Aching Pain Intervention(s): Limited activity within patient's tolerance,  Premedicated before session    Home Living Family/patient expects to be discharged to:: Private residence Living Arrangements: Alone Available Help at Discharge: Family;Available PRN/intermittently Type of Home: Apartment Home Access: Level entry;Ramped entrance       Home Layout: One level Home Equipment: Shower seat - built in;Rollator (4 wheels)       Prior Function Prior Level of Function : Independent/Modified Independent;Driving;History of Falls (last six months)             Mobility Comments: reports >4 falls since Feb 2024. Uses rollator for all mobility ADLs Comments: indep     Hand Dominance        Extremity/Trunk Assessment   Upper Extremity Assessment Upper Extremity Assessment: Overall WFL for tasks assessed (reports L hand tingling)    Lower Extremity Assessment Lower Extremity Assessment: Generalized weakness (B LE grossly 3+/5)       Communication   Communication: No difficulties  Cognition Arousal/Alertness: Awake/alert Behavior During Therapy: WFL for tasks assessed/performed Overall Cognitive Status: Within Functional Limits for tasks assessed                                 General Comments: pleasant and follows commands well        General Comments      Exercises     Assessment/Plan    PT Assessment Patient needs continued PT services  PT Problem List Decreased strength;Decreased balance;Decreased mobility;Pain       PT Treatment Interventions DME instruction;Gait training;Therapeutic activities;Therapeutic exercise    PT Goals (Current goals can be found in the Care Plan section)  Acute Rehab PT Goals Patient Stated Goal: to stop falling PT Goal Formulation: With patient Time For Goal Achievement: 04/11/23 Potential to Achieve Goals: Good    Frequency Min 3X/week     Co-evaluation               AM-PAC PT "6 Clicks" Mobility  Outcome Measure Help needed turning from your back to your side while in a flat bed without using bedrails?: A Little Help needed moving from lying on your back to sitting on the side of a flat bed without using bedrails?: A Little Help needed moving to and from a bed to a chair (including a wheelchair)?: A Lot Help needed standing up from a chair using your arms (e.g., wheelchair or bedside chair)?: A Little Help needed to walk in  hospital room?: A Lot Help needed climbing 3-5 steps with a railing? : A Lot 6 Click Score: 15    End of Session Equipment Utilized During Treatment: Gait belt Activity Tolerance: Patient tolerated treatment well Patient left: in bed Nurse Communication: Mobility status PT Visit Diagnosis: Unsteadiness on feet (R26.81);Muscle weakness (generalized) (M62.81);Repeated falls (R29.6);Difficulty in walking, not elsewhere classified (R26.2);Pain Pain - Right/Left: Right Pain - part of body:  (chest)    Time: 1610-9604 PT Time Calculation (min) (ACUTE ONLY): 12 min   Charges:   PT Evaluation $PT Eval Low Complexity: 1 Low          Elizabeth Palau, PT, DPT, [facility] [phone]   Chimamanda Siegfried 03/28/2023, 1:23 PM

## 2023-03-28 NOTE — Assessment & Plan Note (Signed)
PPI ?

## 2023-03-28 NOTE — Assessment & Plan Note (Addendum)
Recurring issue with prior admissions for weakness, dizziness and falls in the setting of extensive cardiac history including systolic heart failure, atrial fibrillation, coronary artery disease, diastolic CHF, hypertension Blood pressure 180s to 200s over 100s ?hypertensive urgency as confounding issue ? L hand tingling/numbness-check MRI brain to correlate   2D ECHO 10/2022 w/ EF 50-55% and normal valvular function  Check orthostatics  Will also formally consult cardiology as cardiogenic/cardiovascular source likely predominant etiology of symptoms

## 2023-03-28 NOTE — Assessment & Plan Note (Signed)
Cont allopurinol 

## 2023-03-28 NOTE — Assessment & Plan Note (Deleted)
Blood pressure 180s-200s/90s-100s  Continue home regimen including lisinopril and metoprolol As needed IV hydralazine Follow-up cardiology recommendations

## 2023-03-28 NOTE — Assessment & Plan Note (Signed)
Baseline ischemic cardiomyopathy/ Chronic diastolic CHF 2D echo December 2023 with EF 50 to 55% Appears euvolemic Continue home regimen

## 2023-03-28 NOTE — Assessment & Plan Note (Signed)
Baseline CAD s/p CABG x 2  Trop in 20s which appears to be chronic  Will trend trop  Cont home regimen

## 2023-03-28 NOTE — Assessment & Plan Note (Signed)
Cr 0.9  At baseline

## 2023-03-28 NOTE — Assessment & Plan Note (Signed)
Blood pressure 180s-200s/90s-100s  Continue home regimen including lisinopril and metoprolol As needed IV hydralazine Follow-up cardiology recommendations 

## 2023-03-28 NOTE — ED Notes (Signed)
Sent blue top
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
## 2023-03-28 NOTE — ED Notes (Signed)
PT wanting his nighttime meds and something for pain. He states he is ready to rest. Drink and snack also provided. No needs verbalized or signs of distress.

## 2023-03-28 NOTE — ED Notes (Signed)
Pt resting denies needs at this time.

## 2023-03-29 ENCOUNTER — Ambulatory Visit: Payer: Medicare HMO | Admitting: Family Medicine

## 2023-03-29 DIAGNOSIS — R55 Syncope and collapse: Secondary | ICD-10-CM | POA: Diagnosis not present

## 2023-03-29 LAB — COMPREHENSIVE METABOLIC PANEL
ALT: 20 U/L (ref 0–44)
AST: 22 U/L (ref 15–41)
Albumin: 3.2 g/dL — ABNORMAL LOW (ref 3.5–5.0)
Alkaline Phosphatase: 49 U/L (ref 38–126)
Anion gap: 9 (ref 5–15)
BUN: 31 mg/dL — ABNORMAL HIGH (ref 8–23)
CO2: 34 mmol/L — ABNORMAL HIGH (ref 22–32)
Calcium: 8.6 mg/dL — ABNORMAL LOW (ref 8.9–10.3)
Chloride: 94 mmol/L — ABNORMAL LOW (ref 98–111)
Creatinine, Ser: 1.21 mg/dL (ref 0.61–1.24)
GFR, Estimated: >60 mL/min (ref >60)
Glucose, Bld: 156 mg/dL — ABNORMAL HIGH (ref 70–99)
Potassium: 4.9 mmol/L (ref 3.5–5.1)
Sodium: 137 mmol/L (ref 135–145)
Total Bilirubin: 1.3 mg/dL — ABNORMAL HIGH (ref 0.3–1.2)
Total Protein: 5.8 g/dL — ABNORMAL LOW (ref 6.5–8.1)

## 2023-03-29 LAB — CBC
HCT: 50.7 % (ref 39.0–52.0)
Hemoglobin: 15.7 g/dL (ref 13.0–17.0)
MCH: 27.4 pg (ref 26.0–34.0)
MCHC: 31 g/dL (ref 30.0–36.0)
MCV: 88.3 fL (ref 80.0–100.0)
Platelets: 244 10*3/uL (ref 150–400)
RBC: 5.74 MIL/uL (ref 4.22–5.81)
RDW: 16.5 % — ABNORMAL HIGH (ref 11.5–15.5)
WBC: 12 10*3/uL — ABNORMAL HIGH (ref 4.0–10.5)
nRBC: 0 % (ref 0.0–0.2)

## 2023-03-29 MED ORDER — AMIODARONE HCL 200 MG PO TABS
200.0000 mg | ORAL_TABLET | Freq: Every day | ORAL | Status: DC
  Administered 2023-03-30 – 2023-04-05 (×7): 200 mg via ORAL
  Filled 2023-03-29 (×7): qty 1

## 2023-03-29 MED ORDER — ALPRAZOLAM 0.5 MG PO TABS
0.7500 mg | ORAL_TABLET | Freq: Once | ORAL | Status: AC
  Administered 2023-03-29: 0.75 mg via ORAL
  Filled 2023-03-29: qty 1

## 2023-03-29 MED ORDER — ALBUTEROL SULFATE (2.5 MG/3ML) 0.083% IN NEBU
2.5000 mg | INHALATION_SOLUTION | Freq: Four times a day (QID) | RESPIRATORY_TRACT | Status: DC | PRN
  Administered 2023-03-29: 2.5 mg via RESPIRATORY_TRACT
  Filled 2023-03-29: qty 3

## 2023-03-29 MED ORDER — ALPRAZOLAM 1 MG PO TABS
1.0000 mg | ORAL_TABLET | Freq: Three times a day (TID) | ORAL | Status: DC
  Administered 2023-03-29 – 2023-04-05 (×17): 1 mg via ORAL
  Filled 2023-03-29 (×2): qty 1
  Filled 2023-03-29: qty 2
  Filled 2023-03-29: qty 1
  Filled 2023-03-29: qty 2
  Filled 2023-03-29 (×2): qty 1
  Filled 2023-03-29: qty 2
  Filled 2023-03-29 (×2): qty 1
  Filled 2023-03-29: qty 2
  Filled 2023-03-29 (×4): qty 1
  Filled 2023-03-29: qty 2
  Filled 2023-03-29: qty 1

## 2023-03-29 NOTE — ED Notes (Signed)
Pt has sleep apnea and wears 3 L [place] at nightime.
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
## 2023-03-29 NOTE — ED Notes (Addendum)
Pt called out for medicine for back pain. No further needs. Asked for xanax. Explained not time. Pt in agreement.

## 2023-03-29 NOTE — ED Notes (Signed)
First encounter with pt. Pt calling out asking for diet gingerale. Pt ate breakfast tray already. Pt speaking on phone. Respirations are unlabored.

## 2023-03-29 NOTE — ED Notes (Signed)
Overdue meds will be pulled from pyxis and given once pharmacy tech is finished stocking pyxis.

## 2023-03-29 NOTE — Progress Notes (Signed)
Progress Note   Patient: Lawrence [name] ZOX:[id number] DOB: 07-18-1956 DOA: 03/28/2023     0 DOS: the patient was seen and examined on 03/29/2023   Brief hospital course: Taken from H&P and prior notes  Lawrence [name] is a 79XBJ with a PMH of CAD (h/o STEMI s/p CABG x2 LIMA-LAD and SVG-OM1 2002 c/b coronary artery dissection with multiple subsequent PCI and stents), chronic stable angina, HFpEF (50-55%, mild LVH 10/2022), permanent atrial fibrillation s/p unsuccessful DCCV 06/29/2022, COPD (PRN 2L), hx tobacco use, DM2 who presented to [facility] ED 03/28/2023 with syncope and chest pain.    Patient was complaining of left-sided chest pain, increased with exertion and reports that morphine is the only thing that helps his chest pain. He was also complaining of multiple episodes of syncope over the last month.  Endorses prodromal symptoms with dizziness, nausea.  Patient has multiple ED visits, 11 in March and 8 in April.  Multiple falls.  Most recent admission on 4/7 till 4/8 with chest pain.  ED course.  On presentation blood pressure was elevated at 170s to 200s over 100s to 120s.  Labs seems stable, troponin remain negative.  UDS positive for opioids.  UA with mild protein urea and significant glucosuria.  Patient is on [place]. CTA negative for PE, noted 6 mm pulmonary nodule in the right middle lobe which need follow-up as outpatient.  Cardiology was also consulted.  5/13: Blood pressure improved, vital stable.  Labs with mildly elevated bicarb at 34, T. bili 1.3, mild leukocytosis at 12.  MRI brain was checked by admitting provider due to his complaint of tingling and numbness and it was negative for any acute abnormality. PT and OT are recommending SNF.   Assessment and Plan: * Syncope Recurring issue with prior admissions for weakness, dizziness and falls in the setting of extensive cardiac history including systolic heart failure, atrial fibrillation, coronary artery disease, diastolic  CHF, hypertension 2D ECHO 10/2022 w/ EF 50-55% and normal valvular function  Check orthostatics  Cardiology was also consulted-no further workup needed. PT is recommending SNF   Paroxysmal atrial fibrillation with RVR (HCC) Recently restarted on Eliquis May 1 in the setting of resolved thigh hematoma Continue Eliquis and amiodarone Follow  Primary hypertension Blood pressure was significantly elevated on admission and now has been improved. -Continue home meds.  Hypertensive urgency Blood pressure 180s-200s/90s-100s -improved Continue home regimen including lisinopril and metoprolol As needed IV hydralazine Follow-up cardiology recommendations  GERD without esophagitis PPI  Chronic diastolic CHF (congestive heart failure) (HCC) Baseline ischemic cardiomyopathy/ Chronic diastolic CHF 2D echo December 2023 with EF 50 to 55% Appears euvolemic Continue home regimen  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HCC) SSI    Gout Cont allopurinol   Coronary artery disease Baseline CAD s/p CABG x 2  Trop in 20s which appears to be chronic  Will trend trop  Cont home regimen    Dyslipidemia Continue statin  CKD (chronic kidney disease) stage 2, GFR 60-89 ml/min Cr 0.9  At baseline    Chronic obstructive pulmonary disease (COPD) (HCC) Stable from respiratory standpoint Continue home inhalers   Subjective: Patient was complaining of right lateral chest wall pain.  Increased with ambulation, most likely musculoskeletal. He was very concerned about these multiple syncopal episodes and do feel some dizziness before those episodes. Agrees to go to rehab.  Physical Exam: Vitals:   03/29/23 1115 03/29/23 1124 03/29/23 1200 03/29/23 1215  BP:  131/73 120/72 120/78  Pulse:  73 65  72  Resp:    14  Temp:    97.8 F (36.6 C)  TempSrc:    Oral  SpO2: 93% 98% (!) 89% 94%  Weight:      Height:       General.  Well-developed gentleman, in no acute  distress. Pulmonary.  Lungs clear bilaterally, normal respiratory effort. CV.  Irregular with normal rate. Abdomen.  Soft, nontender, nondistended, BS positive. CNS.  Alert and oriented .  No focal neurologic deficit. Extremities.  No edema, no cyanosis, pulses intact and symmetrical. Psychiatry.  Judgment and insight appears normal.   Data Reviewed: Prior data reviewed  Family Communication: Discussed with patient  Disposition: Status is: Observation The patient will require care spanning > 2 midnights and should be moved to inpatient because: Severity of illness  Planned Discharge Destination: Skilled nursing facility  DVT prophylaxis.  Eliquis Time spent: 45 minutes  This record has been created using Conservation officer, historic buildings. Errors have been sought and corrected,but may not always be located. Such creation errors do not reflect on the standard of care.   Author: Arnetha Courser, MD 03/29/2023 1:36 PM  For on call review [email or web address].

## 2023-03-29 NOTE — Progress Notes (Signed)
Physical Therapy Treatment Patient Details Name: Lawrence [name] MRN: [id number] DOB: 02/15/1956 Today's Date: 03/29/2023   History of Present Illness Pt admitted for syncope with fall at home and being unconscious for several minutes. History includes obesity, CAD s/p CABG x 2, HLD, HTN, and COPD. Cleared for mobility per RN.    PT Comments    Patient is agreeable to PT. He reports feeling mild dizziness with upright activity. Standing tolerance of 2-3 minutes, performed x 2 bouts with seated rest break required due to fatigue with activity. Patient continues to require assistance with mobility.  Recommend to continue PT to maximize independence and facilitate return to prior level of function.   Orthostatic vitals during session:  BP- Lying Pulse- Lying BP- Sitting Pulse- Sitting BP- Standing at 0 minutes Pulse- Standing at 0 minutes  03/29/23 1220 116/85 69 120/78 78 109/68 75      Recommendations for follow up therapy are one component of a multi-disciplinary discharge planning process, led by the attending physician.  Recommendations may be updated based on patient status, additional functional criteria and insurance authorization.  Follow Up Recommendations  Can patient physically be transported by private vehicle: No    Assistance Recommended at Discharge Intermittent Supervision/Assistance  Patient can return home with the following A lot of help with walking and/or transfers;A lot of help with bathing/dressing/bathroom;Help with stairs or ramp for entrance;Assist for transportation   Equipment Recommendations   (to be determined at next venue of care)    Recommendations for Other Services       Precautions / Restrictions Precautions Precautions: Fall Restrictions Weight Bearing Restrictions: No     Mobility  Bed Mobility Overal bed mobility: Needs Assistance Bed Mobility: Supine to Sit, Sit to Supine     Supine to sit: Min assist Sit to supine: Min assist    General bed mobility comments: assistance for trunk to sit upright. assistance for LE support to return to bed    Transfers Overall transfer level: Needs assistance Equipment used: Rolling walker (2 wheels) Transfers: Sit to/from Stand Sit to Stand: Min guard           General transfer comment: 2 standing bouts performed with cues for safety    Ambulation/Gait             Pre-gait activities: progressive ambulation deferred due to dizziness with standing. standing tolerance of 2-3 minutes x 2 bouts. patient is able to take 4 small side steps to the right with rolling walker and Min guard for safety     Stairs             Wheelchair Mobility    Modified Rankin (Stroke Patients Only)       Balance Overall balance assessment: History of Falls, Needs assistance Sitting-balance support: Feet supported Sitting balance-Leahy Scale: Good     Standing balance support: Bilateral upper extremity supported, During functional activity Standing balance-Leahy Scale: Fair                              Cognition Arousal/Alertness: Awake/alert Behavior During Therapy: WFL for tasks assessed/performed Overall Cognitive Status: Within Functional Limits for tasks assessed                                          Exercises      General Comments General  comments (skin integrity, edema, etc.): orthostatic vitals taken during session. Sp02 90% on room air with standing. replaced the supplemetal 02 at 2 L Sp02 in the high 90's      Pertinent Vitals/Pain Pain Assessment Pain Assessment: No/denies pain    Home Living                          Prior Function            PT Goals (current goals can now be found in the care plan section) Acute Rehab PT Goals Patient Stated Goal: to go to rehab PT Goal Formulation: With patient Time For Goal Achievement: 04/11/23 Potential to Achieve Goals: Good Progress towards PT goals:  Progressing toward goals    Frequency    Min 3X/week      PT Plan Current plan remains appropriate    Co-evaluation              AM-PAC PT "6 Clicks" Mobility   Outcome Measure  Help needed turning from your back to your side while in a flat bed without using bedrails?: A Little Help needed moving from lying on your back to sitting on the side of a flat bed without using bedrails?: A Little Help needed moving to and from a bed to a chair (including a wheelchair)?: A Lot Help needed standing up from a chair using your arms (e.g., wheelchair or bedside chair)?: A Little Help needed to walk in hospital room?: A Lot Help needed climbing 3-5 steps with a railing? : A Lot 6 Click Score: 15    End of Session   Activity Tolerance: Patient tolerated treatment well Patient left: in bed;with call bell/phone within reach Nurse Communication: Mobility status PT Visit Diagnosis: Unsteadiness on feet (R26.81);Muscle weakness (generalized) (M62.81);Repeated falls (R29.6);Difficulty in walking, not elsewhere classified (R26.2);Pain     Time: 0981-1914 PT Time Calculation (min) (ACUTE ONLY): 38 min  Charges:  $Therapeutic Activity: 38-52 mins                     Donna Bernard, PT, MPT    Ina Homes 03/29/2023, 1:50 PM

## 2023-03-29 NOTE — Assessment & Plan Note (Signed)
Blood pressure 180s-200s/90s-100s -improved Continue home regimen including lisinopril and metoprolol As needed IV hydralazine Follow-up cardiology recommendations

## 2023-03-29 NOTE — ED Notes (Signed)
PT working with patient.

## 2023-03-29 NOTE — Assessment & Plan Note (Signed)
Recurring issue with prior admissions for weakness, dizziness and falls in the setting of extensive cardiac history including systolic heart failure, atrial fibrillation, coronary artery disease, diastolic CHF, hypertension 2D ECHO 10/2022 w/ EF 50-55% and normal valvular function  Check orthostatics  Cardiology was also consulted-no further workup needed. PT is recommending SNF

## 2023-03-29 NOTE — Assessment & Plan Note (Signed)
Blood pressure was significantly elevated on admission and now has been improved. -Continue home meds.

## 2023-03-29 NOTE — Hospital Course (Addendum)
Taken from H&P and prior notes  Lawrence [name] is a 14yoM with a PMH of CAD (h/o STEMI s/p CABG x2 LIMA-LAD and SVG-OM1 2002 c/b coronary artery dissection with multiple subsequent PCI and stents), chronic stable angina, HFpEF (50-55%, mild LVH 10/2022), permanent atrial fibrillation s/p unsuccessful DCCV 06/29/2022, COPD (PRN 2L), hx tobacco use, DM2 who presented to [facility] ED 03/28/2023 with syncope and chest pain.    Patient was complaining of left-sided chest pain, increased with exertion and reports that morphine is the only thing that helps his chest pain. He was also complaining of multiple episodes of syncope over the last month.  Endorses prodromal symptoms with dizziness, nausea.  Patient has multiple ED visits, 11 in March and 8 in April.  Multiple falls.  Most recent admission on 4/7 till 4/8 with chest pain.  ED course.  On presentation blood pressure was elevated at 170s to 200s over 100s to 120s.  Labs seems stable, troponin remain negative.  UDS positive for opioids.  UA with mild protein urea and significant glucosuria.  Patient is on [place]. CTA negative for PE, noted 6 mm pulmonary nodule in the right middle lobe which need follow-up as outpatient.  Cardiology was also consulted.  5/13: Blood pressure improved, vital stable.  Labs with mildly elevated bicarb at 34, T. bili 1.3, mild leukocytosis at 12.  MRI brain was checked by admitting provider due to his complaint of tingling and numbness and it was negative for any acute abnormality. PT and OT are recommending SNF.
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
## 2023-03-30 ENCOUNTER — Encounter: Payer: Self-pay | Admitting: Internal Medicine

## 2023-03-30 DIAGNOSIS — I480 Paroxysmal atrial fibrillation: Secondary | ICD-10-CM

## 2023-03-30 DIAGNOSIS — I160 Hypertensive urgency: Secondary | ICD-10-CM | POA: Diagnosis not present

## 2023-03-30 DIAGNOSIS — R55 Syncope and collapse: Secondary | ICD-10-CM | POA: Diagnosis not present

## 2023-03-30 DIAGNOSIS — I5032 Chronic diastolic (congestive) heart failure: Secondary | ICD-10-CM

## 2023-03-30 DIAGNOSIS — E1121 Type 2 diabetes mellitus with diabetic nephropathy: Secondary | ICD-10-CM

## 2023-03-30 DIAGNOSIS — I25118 Atherosclerotic heart disease of native coronary artery with other forms of angina pectoris: Secondary | ICD-10-CM

## 2023-03-30 DIAGNOSIS — N182 Chronic kidney disease, stage 2 (mild): Secondary | ICD-10-CM

## 2023-03-30 DIAGNOSIS — I10 Essential (primary) hypertension: Secondary | ICD-10-CM

## 2023-03-30 DIAGNOSIS — E785 Hyperlipidemia, unspecified: Secondary | ICD-10-CM

## 2023-03-30 DIAGNOSIS — K219 Gastro-esophageal reflux disease without esophagitis: Secondary | ICD-10-CM

## 2023-03-30 DIAGNOSIS — I4891 Unspecified atrial fibrillation: Secondary | ICD-10-CM

## 2023-03-30 LAB — TROPONIN I (HIGH SENSITIVITY)
Troponin I (High Sensitivity): 15 ng/L (ref <18)
Troponin I (High Sensitivity): 16 ng/L (ref <18)

## 2023-03-30 LAB — CBG MONITORING, ED: Glucose-Capillary: 183 mg/dL — ABNORMAL HIGH (ref 70–99)

## 2023-03-30 MED ORDER — MORPHINE SULFATE (PF) 2 MG/ML IV SOLN
2.0000 mg | INTRAVENOUS | Status: AC | PRN
  Administered 2023-03-30 – 2023-03-31 (×3): 2 mg via INTRAVENOUS
  Filled 2023-03-30 (×3): qty 1

## 2023-03-30 NOTE — Progress Notes (Addendum)
Progress Note   Patient: Lawrence [name] ZOX:[id number] DOB: [birth year] DOA: 03/28/2023     0 DOS: the patient was seen and examined on 03/30/2023   Brief hospital course: OSEIAS REGENOLD is a 79XBJ with a PMH of CAD (h/o STEMI s/p CABG x2 LIMA-LAD and SVG-OM1 2002 c/b coronary artery dissection with multiple subsequent PCI and stents), chronic stable angina, HFpEF (50-55%, mild LVH 10/2022), permanent atrial fibrillation s/p unsuccessful DCCV 06/29/2022, COPD (PRN 2L), hx tobacco use, DM2 who presented to [facility] ED 03/28/2023 with syncope and chest pain. Patient was complaining of left-sided chest pain, increased with exertion and reports that morphine is the only thing that helps his chest pain. He was also complaining of multiple episodes of syncope over the last month.  Endorses prodromal symptoms with dizziness, nausea. Patient has multiple ED visits, 11 in March and 8 in April.  Multiple falls.  Most recent admission on 4/7 till 4/8 with chest pain.  5/13-5/14: MRI negative for any acute findings, vitals improving.  Clinically patient remains profoundly weak, unable to ambulate without max assist, high fall risk.  Assessment and Plan:  Syncope, recurrent Acute ambulatory dysfunction Recurring issue with prior admissions for weakness, dizziness and falls in the setting of extensive cardiac history including systolic heart failure, atrial fibrillation, coronary artery disease, diastolic CHF, hypertension 2D ECHO 10/2022 w/ EF 50-55% and normal valvular function  Orthostatics unremarkable Cardiology was also consulted-no further inpatient workup warranted PT is recommending SNF given patient's profound instability and falls  Paroxysmal atrial fibrillation with RVR (HCC) Recently restarted on Eliquis May 1 in the setting of resolved thigh hematoma Continue Eliquis and amiodarone Follow clinically no signs or symptoms of bleeding at this time  Hypertensive urgency, resolved Primary  hypertension Blood pressure elevated to 200/100 at intake, improved with resumption of home medications including lisinopril and metoprolol  GERD without esophagitis PPI  Chronic diastolic CHF (congestive heart failure) (HCC) Baseline ischemic cardiomyopathy/ Chronic diastolic CHF 2D echo December 2023 with EF 50 to 55% Appears euvolemic Continue home regimen  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HCC) SSI, hypoglycemic protocol ongoing A1c 6.20 February 2023  Gout Cont allopurinol  Coronary artery disease Baseline CAD s/p CABG x 2  Minimally elevated troponin, chronic Continue core measures as above(metoprolol, lisinopril, statin)  Dyslipidemia Continue statin  CKD (chronic kidney disease) stage 2, GFR 60-89 ml/min Cr 0.9  At baseline   Chronic obstructive pulmonary disease (COPD) (HCC) Stable from respiratory standpoint Continue home inhalers  Subjective: No acute issues or events overnight, patient tolerating breakfast well, denies nausea vomiting diarrhea constipation headache fevers chills or chest pain.  Physical Exam: Vitals:   03/30/23 0552 03/30/23 0700 03/30/23 0730 03/30/23 0745  BP: (!) 99/49 112/80 107/72   Pulse: 63 66  63  Resp: 14 13 11 11   Temp: 98.4 F (36.9 C)     TempSrc: Oral     SpO2: 95% 96% 97% 99%  Weight:      Height:       General.  Well-developed gentleman, in no acute distress. Pulmonary.  Lungs clear bilaterally, normal respiratory effort. CV.  Irregular with normal rate. Abdomen.  Soft, nontender, nondistended, BS positive. CNS.  Alert and oriented .  No focal neurologic deficit. Extremities.  No edema, no cyanosis, pulses intact and symmetrical. Psychiatry.  Judgment and insight appears normal.   Data Reviewed: Prior data reviewed Code status DNR - confirmed with nurse present at bedside Family Communication: Discussed with patient  DVT prophylaxis.  Eliquis Time spent: 45 minutes  Author: Carma Leaven  DO 03/30/2023 7:48 AM  For on call review [email or web address].

## 2023-03-30 NOTE — TOC Progression Note (Signed)
Transition of Care [facility]) - Progression Note    Patient Details  Name: Lawrence [name] MRN: [id number] Date of Birth: 07-13-1956  Transition of Care [facility]) CM/SW Contact  Darolyn Rua, [place] Phone Number: 03/30/2023, 9:27 AM  Clinical Narrative:     Patient has no bed offers yet, [facility] decision pending (patient's preference)  CSW has reached out to [place] with admissions at [facility] to see if they are able to accept patient, pending response at this time.   Expected Discharge Plan: Skilled Nursing Facility Barriers to Discharge: Continued Medical Work up  Expected Discharge Plan and Services       Living arrangements for the past 2 months: Single Family Home                                       Social Determinants of Health (SDOH) Interventions SDOH Screenings   Food Insecurity: No Food Insecurity (03/10/2023)  Housing: Low Risk  (03/10/2023)  Transportation Needs: No Transportation Needs (03/10/2023)  Utilities: Not At Risk (03/10/2023)  Alcohol Screen: Low Risk  (03/10/2023)  Depression (PHQ2-9): Low Risk  (03/10/2023)  Financial Resource Strain: Low Risk  (03/10/2023)  Physical Activity: Inactive (03/10/2023)  Social Connections: Moderately Isolated (03/10/2023)  Stress: No Stress Concern Present (03/10/2023)  Tobacco Use: Medium Risk (03/27/2023)    Readmission Risk Interventions     No data to display

## 2023-03-30 NOTE — Progress Notes (Signed)
Occupational Therapy Treatment Patient Details Name: Lawrence [name] MRN: [id number] DOB: May 29, 1956 Today's Date: 03/30/2023   History of present illness Pt admitted for syncope with fall at home and being unconscious for several minutes. History includes obesity, CAD s/p CABG x 2, HLD, HTN, and COPD. Cleared for mobility per RN.   OT comments  Patient received supine in bed and agreeable to OT. Tx session targeted increasing activity tolerance for improved ADL completion. Pt required Min A to come to EOB this date. Pt then endorsed needing to use the urinal and was able to do so in standing with CGA for safety. Pt c/o dizziness in standing but was able to take several lateral steps along EOB before returning to supine using RW. Pt left as received with all needs in reach. Pt is making progress toward goal completion. D/C recommendation remains appropriate. OT will continue to follow acutely.     Recommendations for follow up therapy are one component of a multi-disciplinary discharge planning process, led by the attending physician.  Recommendations may be updated based on patient status, additional functional criteria and insurance authorization.    Assistance Recommended at Discharge Intermittent Supervision/Assistance  Patient can return home with the following  A lot of help with walking and/or transfers;A lot of help with bathing/dressing/bathroom;Assistance with cooking/housework;Assist for transportation;Help with stairs or ramp for entrance   Equipment Recommendations  Other (comment) (defer to next venue of care)    Recommendations for Other Services      Precautions / Restrictions Precautions Precautions: Fall Restrictions Weight Bearing Restrictions: No       Mobility Bed Mobility Overal bed mobility: Needs Assistance Bed Mobility: Supine to Sit, Sit to Supine     Supine to sit: HOB elevated, Min assist Sit to supine: Supervision        Transfers Overall  transfer level: Needs assistance Equipment used: Rolling walker (2 wheels) Transfers: Sit to/from Stand Sit to Stand: Min guard           General transfer comment: STS from EOB     Balance Overall balance assessment: History of Falls, Needs assistance Sitting-balance support: Feet supported Sitting balance-Leahy Scale: Good     Standing balance support: Bilateral upper extremity supported, During functional activity Standing balance-Leahy Scale: Fair         ADL either performed or assessed with clinical judgement   ADL Overall ADL's : Needs assistance/impaired       Toilet Transfer: Rolling walker (2 wheels);Min guard Toilet Transfer Details (indicate cue type and reason): simulated with STS from EOB Toileting- Clothing Manipulation and Hygiene: Min guard;Sit to/from stand Toileting - Clothing Manipulation Details (indicate cue type and reason): use of urinal in standing, continent void     Functional mobility during ADLs: Min guard;Rolling walker (2 wheels) (pt able to take several side steps (~28ft) along EOB)      Extremity/Trunk Assessment              Vision Baseline Vision/History: 1 Wears glasses Patient Visual Report: No change from baseline     Perception     Praxis      Cognition Arousal/Alertness: Awake/alert Behavior During Therapy: WFL for tasks assessed/performed Overall Cognitive Status: Within Functional Limits for tasks assessed            Exercises      Shoulder Instructions       General Comments HR and SpO2 WNL during activity. Pt endorsed dizziness with standing, BP 126/88 (101).  Pertinent Vitals/ Pain       Pain Assessment Pain Assessment: Faces Faces Pain Scale: Hurts a little bit Pain Location: chest and lower back Pain Descriptors / Indicators: Discomfort, Aching Pain Intervention(s): Limited activity within patient's tolerance, Monitored during session, Repositioned  Home Living        Prior  Functioning/Environment              Frequency  Min 1X/week        Progress Toward Goals  OT Goals(current goals can now be found in the care plan section)  Progress towards OT goals: Progressing toward goals  Acute Rehab OT Goals Patient Stated Goal: get stronger OT Goal Formulation: With patient Time For Goal Achievement: 04/11/23 Potential to Achieve Goals: Good  Plan Discharge plan remains appropriate;Frequency remains appropriate    Co-evaluation                 AM-PAC OT "6 Clicks" Daily Activity     Outcome Measure   Help from another person eating meals?: None Help from another person taking care of personal grooming?: A Little Help from another person toileting, which includes using toliet, bedpan, or urinal?: A Lot Help from another person bathing (including washing, rinsing, drying)?: A Lot Help from another person to put on and taking off regular upper body clothing?: A Little Help from another person to put on and taking off regular lower body clothing?: A Lot 6 Click Score: 16    End of Session Equipment Utilized During Treatment: Gait belt;Rolling walker (2 wheels)  OT Visit Diagnosis: Unsteadiness on feet (R26.81);Repeated falls (R29.6);Muscle weakness (generalized) (M62.81)   Activity Tolerance Patient tolerated treatment well;Other (comment) (limited 2/2 dizziness)   Patient Left in bed;with call bell/phone within reach;with bed alarm set   Nurse Communication Mobility status        Time: 1049-1106 OT Time Calculation (min): 17 min  Charges: OT General Charges $OT Visit: 1 Visit OT Treatments $Self Care/Home Management : 8-22 mins  [facility] MS, OTR/L ascom [phone]  03/30/23, 2:15 PM

## 2023-03-30 NOTE — Progress Notes (Signed)
Physical Therapy Treatment Patient Details Name: Lawrence [name] MRN: [id number] DOB: [birth year] Today's Date: 03/30/2023   History of Present Illness Pt admitted for syncope with fall at home and being unconscious for several minutes. History includes obesity, CAD s/p CABG x 2, HLD, HTN, and COPD. Cleared for mobility per RN.    PT Comments    Pt received supine in bed agreeable to PT. Reports continued chest pain as documented but also lower back pain. Pt remains dizzy with supine to sit and STS but is able to easily transfer this date from EOB to recliner via SPT.  Reports significant dizziness with standing deferring gait attempts. Pt remains limited due to dizziness. May consider abdominal binder and/or ted hose to assist in BP elevation. Reinforced importance of upright sitting to improve tolerance for upright activities. D/c recs remain appropriate.    Recommendations for follow up therapy are one component of a multi-disciplinary discharge planning process, led by the attending physician.  Recommendations may be updated based on patient status, additional functional criteria and insurance authorization.  Follow Up Recommendations  Can patient physically be transported by private vehicle: No    Assistance Recommended at Discharge Intermittent Supervision/Assistance  Patient can return home with the following A lot of help with walking and/or transfers;A lot of help with bathing/dressing/bathroom;Help with stairs or ramp for entrance;Assist for transportation   Equipment Recommendations  Other (comment) (TBD by next venue of care)    Recommendations for Other Services       Precautions / Restrictions Precautions Precautions: Fall Restrictions Weight Bearing Restrictions: No     Mobility  Bed Mobility Overal bed mobility: Needs Assistance Bed Mobility: Supine to Sit     Supine to sit: HOB elevated, Min assist     General bed mobility comments: HHA+1 and HOB mildly  elevated Patient Response: Cooperative  Transfers Overall transfer level: Needs assistance Equipment used: Rolling walker (2 wheels) Transfers: Sit to/from Stand, Bed to chair/wheelchair/BSC Sit to Stand: Min guard   Step pivot transfers: Min guard       General transfer comment: safe use of RW to sequences steps to recliner.    Ambulation/Gait               General Gait Details: unable due to dizziness   Stairs             Wheelchair Mobility    Modified Rankin (Stroke Patients Only)       Balance Overall balance assessment: History of Falls, Needs assistance Sitting-balance support: Feet supported Sitting balance-Leahy Scale: Good     Standing balance support: Bilateral upper extremity supported, During functional activity Standing balance-Leahy Scale: Fair Standing balance comment: relies on RW at baseline                            Cognition Arousal/Alertness: Awake/alert Behavior During Therapy: WFL for tasks assessed/performed Overall Cognitive Status: Within Functional Limits for tasks assessed                                          Exercises Other Exercises Other Exercises: reinforced on upright sitting to tolerance. Benefits of ted hose or abdominal binder.    General Comments General comments (skin integrity, edema, etc.): HR and SPO2 normal throughout mobility      Pertinent Vitals/Pain Pain Assessment Pain Assessment: Faces  Faces Pain Scale: Hurts even more Pain Location: chest and lower back Pain Descriptors / Indicators: Discomfort, Aching Pain Intervention(s): Limited activity within patient's tolerance, Monitored during session, Repositioned    Home Living                          Prior Function            PT Goals (current goals can now be found in the care plan section) Acute Rehab PT Goals Patient Stated Goal: to go to rehab PT Goal Formulation: With patient Time For Goal  Achievement: 04/11/23 Potential to Achieve Goals: Good Additional Goals Additional Goal #1: Pt will be able to perform bed mobility/transfers with mod I and safe technique in order to improve functional independence Progress towards PT goals: Progressing toward goals    Frequency    Min 3X/week      PT Plan Current plan remains appropriate    Co-evaluation              AM-PAC PT "6 Clicks" Mobility   Outcome Measure  Help needed turning from your back to your side while in a flat bed without using bedrails?: A Little Help needed moving from lying on your back to sitting on the side of a flat bed without using bedrails?: A Little Help needed moving to and from a bed to a chair (including a wheelchair)?: A Little Help needed standing up from a chair using your arms (e.g., wheelchair or bedside chair)?: A Little Help needed to walk in hospital room?: A Lot Help needed climbing 3-5 steps with a railing? : A Lot 6 Click Score: 16    End of Session Equipment Utilized During Treatment: Gait belt Activity Tolerance: Patient tolerated treatment well Patient left: in chair;with call bell/phone within reach Nurse Communication: Mobility status PT Visit Diagnosis: Unsteadiness on feet (R26.81);Muscle weakness (generalized) (M62.81);Repeated falls (R29.6);Difficulty in walking, not elsewhere classified (R26.2);Pain Pain - Right/Left: Right Pain - part of body:  (chest and lower back)     Time: 1610-9604 PT Time Calculation (min) (ACUTE ONLY): 20 min  Charges:  $Therapeutic Activity: 8-22 mins                    Delphia Grates. Fairly IV, PT, DPT Physical Therapist- [facility]  03/30/2023, 12:44 PM

## 2023-03-30 NOTE — Progress Notes (Signed)
         CROSS COVER NOTE  NAME: Lawrence [name] MRN: [id number] DOB : 08/09/1956    HPI/Events of Note   Report: Chest pain and back pain (chronic) not responding to oxycodone.  Previously reported only response to morphine  On review of chart: Evaluated by cardiology on 5/12 # Chest Pain # Coronary Artery Disease s/p CABG x2 in 2002  # Hypertensive Urgency The patient has a long history of CAD with prior CABG and subsequent stenting, chronic pain. Troponins borderline and flat. EKG without evidence of new ischemia. Inconsistent with ACS. Will continue home anti-anginal therapy.  -Troponins trended 18>23, chest pain likely 2/2 hypertensive urgency -Continue Aspirin 81 mg daily and Atorvastatin 80 mg daily.  -Restart home Imdur 60 mg daily. ?Patient may be taking two tablets of 60 mg at home.  -Continue Metoprolol 100 mg twice daily and Ranexa 1000 mg twice daily.  -minimize use of IV opioids -consider referral to pain management -defer additional cardiac diagnostics at this time.   Assessment and  Interventions   Assessment: Unrelenting chest pain, likely noncardiac, ruled out for ACS on admission  Plan: Per cardiologist, will continue antianginal meds Morphine for breakthrough Will get an EKG

## 2023-03-30 NOTE — Telephone Encounter (Signed)
Lawrence [name] from [employer] stated she received Rx for incontinence supplies about 3-4 weeks ago. Confirmation of order needs to be signed and needs clinical notes.  Lawrence [name] is requesting a callback.  Please advise.

## 2023-03-31 DIAGNOSIS — E669 Obesity, unspecified: Secondary | ICD-10-CM | POA: Diagnosis present

## 2023-03-31 DIAGNOSIS — Z820 Family history of epilepsy and other diseases of the nervous system: Secondary | ICD-10-CM | POA: Diagnosis not present

## 2023-03-31 DIAGNOSIS — I5042 Chronic combined systolic (congestive) and diastolic (congestive) heart failure: Secondary | ICD-10-CM | POA: Diagnosis present

## 2023-03-31 DIAGNOSIS — Z86010 Personal history of colonic polyps: Secondary | ICD-10-CM | POA: Diagnosis not present

## 2023-03-31 DIAGNOSIS — E1122 Type 2 diabetes mellitus with diabetic chronic kidney disease: Secondary | ICD-10-CM | POA: Diagnosis present

## 2023-03-31 DIAGNOSIS — I255 Ischemic cardiomyopathy: Secondary | ICD-10-CM | POA: Diagnosis present

## 2023-03-31 DIAGNOSIS — D72829 Elevated white blood cell count, unspecified: Secondary | ICD-10-CM | POA: Diagnosis present

## 2023-03-31 DIAGNOSIS — I160 Hypertensive urgency: Secondary | ICD-10-CM | POA: Diagnosis present

## 2023-03-31 DIAGNOSIS — K219 Gastro-esophageal reflux disease without esophagitis: Secondary | ICD-10-CM | POA: Diagnosis present

## 2023-03-31 DIAGNOSIS — I2510 Atherosclerotic heart disease of native coronary artery without angina pectoris: Secondary | ICD-10-CM | POA: Diagnosis present

## 2023-03-31 DIAGNOSIS — K900 Celiac disease: Secondary | ICD-10-CM | POA: Diagnosis present

## 2023-03-31 DIAGNOSIS — Z818 Family history of other mental and behavioral disorders: Secondary | ICD-10-CM | POA: Diagnosis not present

## 2023-03-31 DIAGNOSIS — R55 Syncope and collapse: Secondary | ICD-10-CM | POA: Diagnosis present

## 2023-03-31 DIAGNOSIS — G8929 Other chronic pain: Secondary | ICD-10-CM | POA: Diagnosis present

## 2023-03-31 DIAGNOSIS — G4733 Obstructive sleep apnea (adult) (pediatric): Secondary | ICD-10-CM | POA: Diagnosis present

## 2023-03-31 DIAGNOSIS — M1990 Unspecified osteoarthritis, unspecified site: Secondary | ICD-10-CM | POA: Diagnosis present

## 2023-03-31 DIAGNOSIS — M5136 Other intervertebral disc degeneration, lumbar region: Secondary | ICD-10-CM | POA: Diagnosis present

## 2023-03-31 DIAGNOSIS — Z66 Do not resuscitate: Secondary | ICD-10-CM | POA: Diagnosis not present

## 2023-03-31 DIAGNOSIS — M109 Gout, unspecified: Secondary | ICD-10-CM | POA: Diagnosis present

## 2023-03-31 DIAGNOSIS — J4489 Other specified chronic obstructive pulmonary disease: Secondary | ICD-10-CM | POA: Diagnosis present

## 2023-03-31 DIAGNOSIS — E785 Hyperlipidemia, unspecified: Secondary | ICD-10-CM | POA: Diagnosis present

## 2023-03-31 DIAGNOSIS — I959 Hypotension, unspecified: Secondary | ICD-10-CM | POA: Diagnosis not present

## 2023-03-31 DIAGNOSIS — N182 Chronic kidney disease, stage 2 (mild): Secondary | ICD-10-CM | POA: Diagnosis present

## 2023-03-31 DIAGNOSIS — I4821 Permanent atrial fibrillation: Secondary | ICD-10-CM | POA: Diagnosis present

## 2023-03-31 DIAGNOSIS — I130 Hypertensive heart and chronic kidney disease with heart failure and stage 1 through stage 4 chronic kidney disease, or unspecified chronic kidney disease: Secondary | ICD-10-CM | POA: Diagnosis present

## 2023-03-31 LAB — GLUCOSE, CAPILLARY: Glucose-Capillary: 174 mg/dL — ABNORMAL HIGH (ref 70–99)

## 2023-03-31 MED ORDER — MORPHINE SULFATE (PF) 2 MG/ML IV SOLN
2.0000 mg | INTRAVENOUS | Status: DC | PRN
  Administered 2023-03-31 – 2023-04-05 (×12): 2 mg via INTRAVENOUS
  Filled 2023-03-31 (×12): qty 1

## 2023-03-31 NOTE — Telephone Encounter (Signed)
Orders completed and sent to medical records with instructions to fax notes from 03-01-2023

## 2023-03-31 NOTE — Progress Notes (Signed)
PROGRESS NOTE    Lawrence [name]  WUJ:[id number] DOB: Mar 19, 1956 DOA: 03/28/2023 PCP: Malva Limes, MD    Brief Narrative:   Lawrence [name] is a [phone] with a PMH of CAD (h/o STEMI s/p CABG x2 LIMA-LAD and SVG-OM1 2002 c/b coronary artery dissection with multiple subsequent PCI and stents), chronic stable angina, HFpEF (50-55%, mild LVH 10/2022), permanent atrial fibrillation s/p unsuccessful DCCV 06/29/2022, COPD (PRN 2L), hx tobacco use, DM2 who presented to [facility] ED 03/28/2023 with syncope and chest pain. Patient was complaining of left-sided chest pain, increased with exertion and reports that morphine is the only thing that helps his chest pain. He was also complaining of multiple episodes of syncope over the last month.  Endorses prodromal symptoms with dizziness, nausea. Patient has multiple ED visits, 11 in March and 8 in April.  Multiple falls.  Most recent admission on 4/7 till 4/8 with chest pain.   5/13-5/14: MRI negative for any acute findings, vitals improving.  Clinically patient remains profoundly weak, unable to ambulate without max assist, high fall risk.  5/15: Patient remains orthostatic.  Pending bed offers for skilled nursing facility placement.  Assessment & Plan:   Principal Problem:   Syncope Active Problems:   Chest pain   Primary hypertension   Paroxysmal atrial fibrillation with RVR (HCC)   Hypertensive urgency   Chronic diastolic CHF (congestive heart failure) (HCC)   GERD without esophagitis   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HCC)   Chronic obstructive pulmonary disease (COPD) (HCC)   CKD (chronic kidney disease) stage 2, GFR 60-89 ml/min   Dyslipidemia   Coronary artery disease   Gout  Syncope, recurrent Acute ambulatory dysfunction Recurring issue with prior admissions for weakness, dizziness and falls in the setting of extensive cardiac history including systolic heart failure, atrial fibrillation, coronary artery disease, diastolic  CHF, hypertension 2D ECHO 10/2022 w/ EF 50-55% and normal valvular function  Orthostatics unremarkable Cardiology was also consulted-no further inpatient workup warranted PT is recommending SNF given patient's profound instability and falls Pursue medically stable for discharge to skilled nursing facility.  Placing abdominal binder and TED hose today to minimize orthostatic drops.   Paroxysmal atrial fibrillation with RVR (HCC) Recently restarted on Eliquis May 1 in the setting of resolved thigh hematoma Continue Eliquis and amiodarone Follow clinically no signs or symptoms of bleeding at this time   Hypertensive urgency, resolved Primary hypertension Blood pressure elevated to 200/100 at intake, improved with resumption of home medications including lisinopril and metoprolol.  Improved control over interval.   GERD without esophagitis PPI   Chronic diastolic CHF (congestive heart failure) (HCC) Baseline ischemic cardiomyopathy/ Chronic diastolic CHF 2D echo December 2023 with EF 50 to 55% Appears euvolemic Continue home regimen   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HCC) SSI, hypoglycemic protocol ongoing A1c 6.20 February 2023   Gout Cont allopurinol   Coronary artery disease Baseline CAD s/p CABG x 2  Minimally elevated troponin, chronic Continue core measures as above(metoprolol, lisinopril, statin)   Dyslipidemia Continue statin   CKD (chronic kidney disease) stage 2, GFR 60-89 ml/min Cr 0.9  At baseline    Chronic obstructive pulmonary disease (COPD) (HCC) Stable from respiratory standpoint Continue home inhalers   DVT prophylaxis: Apixaban Code Status: DNR Family Communication: None Disposition Plan: Status is: Inpatient Remains inpatient appropriate because: Unsafe discharge plan.  Need skilled nursing facility placement.   Level of care: Med-Surg  Consultants:  None  Procedures:  None  Antimicrobials: None  Subjective: Seen and  examined.  Resting comfortably in bed.  Endorses pain in back, chest, legs  Objective: Vitals:   03/31/23 0500 03/31/23 0549 03/31/23 0755 03/31/23 1117  BP:  105/66 97/63 115/78  Pulse:  (!) 56 62 65  Resp:  20 16 18   Temp:   98.2 F (36.8 C) (!) 97.5 F (36.4 C)  TempSrc:   Oral   SpO2:  96% 97% 98%  Weight: 85.5 kg     Height:        Intake/Output Summary (Last 24 hours) at 03/31/2023 1337 Last data filed at 03/31/2023 1311 Gross per 24 hour  Intake 240 ml  Output 2175 ml  Net -1935 ml   Filed Weights   03/27/23 2042 03/31/23 0500  Weight: 89.4 kg 85.5 kg    Examination:  General exam: Appears calm and comfortable  Respiratory system: Clear to auscultation. Respiratory effort normal. Cardiovascular system: S1-S2, RRR, no murmurs, no pedal edema Gastrointestinal system: Soft, NT/ND, normal bowel sounds Central nervous system: Alert and oriented. No focal neurological deficits. Extremities: Decreased power bilateral lower extremities.  Gait not assessed. Skin: No rashes, lesions or ulcers Psychiatry: Judgement and insight appear normal. Mood & affect appropriate.     Data Reviewed: I have personally reviewed following labs and imaging studies  CBC: Recent Labs  Lab 03/27/23 2036 03/29/23 0500  WBC 9.0 12.0*  HGB 14.1 15.7  HCT 46.1 50.7  MCV 89.3 88.3  PLT 220 244   Basic Metabolic Panel: Recent Labs  Lab 03/27/23 2036 03/29/23 0617  NA 137 137  K 4.4 4.9  CL 104 94*  CO2 26 34*  GLUCOSE 206* 156*  BUN 20 31*  CREATININE 0.91 1.21  CALCIUM 8.6* 8.6*   GFR: Estimated Creatinine Clearance: 60.2 mL/min (by C-G formula based on SCr of 1.21 mg/dL). Liver Function Tests: Recent Labs  Lab 03/29/23 0617  AST 22  ALT 20  ALKPHOS 49  BILITOT 1.3*  PROT 5.8*  ALBUMIN 3.2*   No results for input(s): "LIPASE", "AMYLASE" in the last 168 hours. No results for input(s): "AMMONIA" in the last 168 hours. Coagulation Profile: No results for input(s):  "INR", "PROTIME" in the last 168 hours. Cardiac Enzymes: No results for input(s): "CKTOTAL", "CKMB", "CKMBINDEX", "TROPONINI" in the last 168 hours. BNP (last 3 results) No results for input(s): "PROBNP" in the last 8760 hours. HbA1C: No results for input(s): "HGBA1C" in the last 72 hours. CBG: Recent Labs  Lab 03/27/23 2048 03/30/23 0745 03/31/23 0501  GLUCAP 213* 183* 174*   Lipid Profile: No results for input(s): "CHOL", "HDL", "LDLCALC", "TRIG", "CHOLHDL", "LDLDIRECT" in the last 72 hours. Thyroid Function Tests: No results for input(s): "TSH", "T4TOTAL", "FREET4", "T3FREE", "THYROIDAB" in the last 72 hours. Anemia Panel: No results for input(s): "VITAMINB12", "FOLATE", "FERRITIN", "TIBC", "IRON", "RETICCTPCT" in the last 72 hours. Sepsis Labs: No results for input(s): "PROCALCITON", "LATICACIDVEN" in the last 168 hours.  No results found for this or any previous visit (from the past 240 hour(s)).       Radiology Studies: No results found.      Scheduled Meds:  allopurinol  300 mg Oral BID   ALPRAZolam  1 mg Oral TID   amiodarone  200 mg Oral Daily   apixaban  5 mg Oral BID   aspirin  81 mg Oral Daily   atorvastatin  80 mg Oral Daily   dapagliflozin propanediol  10 mg Oral Daily   isosorbide mononitrate  60 mg Oral Daily  lisinopril  20 mg Oral Daily   metoprolol succinate  100 mg Oral Daily   pantoprazole  40 mg Oral Daily   ranolazine  1,000 mg Oral BID   sodium chloride flush  3 mL Intravenous Q12H   torsemide  50 mg Oral Daily   Continuous Infusions:   LOS: 0 days     Lawrence Moore, MD [facility]   If 7PM-7AM, please contact night-coverage  03/31/2023, 1:37 PM

## 2023-03-31 NOTE — Progress Notes (Signed)
Occupational Therapy Treatment Patient Details Name: CHANZ NEISWONGER MRN: [id number] DOB: [birth year] Today's Date: 03/31/2023   History of present illness Pt admitted for syncope with fall at home and being unconscious for several minutes. History includes obesity, CAD s/p CABG x 2, HLD, HTN, and COPD. Cleared for mobility per RN.   OT comments  Pt received semi-reclined in bed. Appearing alert, but closing eyes intermittently (states he just got morphine from RN); willing to work with OT on t/f to chair. T/f CGA with RW. See flowsheet below for further details of session. Left seated in recliner with all needs in reach. Continues to appear hypotensive; see below for details. Patient will benefit from continued OT while in acute care.    Recommendations for follow up therapy are one component of a multi-disciplinary discharge planning process, led by the attending physician.  Recommendations may be updated based on patient status, additional functional criteria and insurance authorization.    Assistance Recommended at Discharge Intermittent Supervision/Assistance  Patient can return home with the following  A lot of help with walking and/or transfers;A lot of help with bathing/dressing/bathroom;Assistance with cooking/housework;Assist for transportation;Help with stairs or ramp for entrance   Equipment Recommendations  Other (comment) (defer to next venue of care)    Recommendations for Other Services      Precautions / Restrictions Precautions Precautions: Fall Restrictions Weight Bearing Restrictions: No       Mobility Bed Mobility Overal bed mobility: Needs Assistance Bed Mobility: Supine to Sit     Supine to sit: Min guard     General bed mobility comments: Cues to reach for bed rail.    Transfers Overall transfer level: Needs assistance Equipment used: Rolling walker (2 wheels) Transfers: Sit to/from Stand Sit to Stand: Min guard           General transfer  comment: Pt took sidesteps towards recliner. Reports being dizzy while standing; BP taken after pt safely seated in recliner chair.     Balance Overall balance assessment: History of Falls, Needs assistance Sitting-balance support: Feet supported Sitting balance-Leahy Scale: Good     Standing balance support: Bilateral upper extremity supported, During functional activity Standing balance-Leahy Scale: Fair Standing balance comment: relies on RW at baseline                           ADL either performed or assessed with clinical judgement   ADL Overall ADL's : Needs assistance/impaired                     Lower Body Dressing: Set up Lower Body Dressing Details (indicate cue type and reason): donned socks while in bed               General ADL Comments: Pt requiring assistance to pull down pants in standing today 2/2 pt feeling dizzy and needing BIL hands on RW to maintain standing balance. Pants/underwear soiled with urine. Pt declines grooming at this time; states he needs his denture brush rather than toothbrush. OT assisted pt to change gown (also wet; pt stating that he was sweating earlier while in bed); OT changed pt's bed linens as well.    Extremity/Trunk Assessment Upper Extremity Assessment Upper Extremity Assessment: Overall WFL for tasks assessed   Lower Extremity Assessment Lower Extremity Assessment: Generalized weakness (pt stating that legs feel "wobbly" with mobility)        Vision       Perception  Praxis      Cognition Arousal/Alertness: Awake/alert (although closing eyes frequently) Behavior During Therapy: WFL for tasks assessed/performed Overall Cognitive Status: Within Functional Limits for tasks assessed                                 General Comments: pleasant and follows commands well        Exercises      Shoulder Instructions       General Comments Pt without complaint at baseline. On 2L O2  throughout session with saturation 95%. BP in semi-reclined in R upper arm: 88/57. T/f to seated and BP 102/69. Pt stood and sidestepped, reported dizziness, sat in chair, BP then 97/67 with feet on floor. OT elevated pt's feet; he continued to feel dizzy; BP 99/66. Dizziness slowly subsiding.    Pertinent Vitals/ Pain       Pain Assessment Pain Assessment: No/denies pain  Home Living                                          Prior Functioning/Environment              Frequency  Min 1X/week        Progress Toward Goals  OT Goals(current goals can now be found in the care plan section)  Progress towards OT goals: Progressing toward goals  Acute Rehab OT Goals Patient Stated Goal: feel better OT Goal Formulation: With patient Time For Goal Achievement: 04/11/23 Potential to Achieve Goals: Good ADL Goals Pt Will Perform Lower Body Dressing: with modified independence;sit to/from stand Pt Will Transfer to Toilet: with modified independence;ambulating Pt Will Perform Toileting - Clothing Manipulation and hygiene: with modified independence;sit to/from stand  Plan Discharge plan remains appropriate;Frequency remains appropriate    Co-evaluation                 AM-PAC OT "6 Clicks" Daily Activity     Outcome Measure   Help from another person eating meals?: None Help from another person taking care of personal grooming?: A Little Help from another person toileting, which includes using toliet, bedpan, or urinal?: A Lot Help from another person bathing (including washing, rinsing, drying)?: A Lot Help from another person to put on and taking off regular upper body clothing?: A Little Help from another person to put on and taking off regular lower body clothing?: A Lot 6 Click Score: 16    End of Session Equipment Utilized During Treatment: Rolling walker (2 wheels)  OT Visit Diagnosis: Unsteadiness on feet (R26.81);Repeated falls (R29.6);Muscle  weakness (generalized) (M62.81)   Activity Tolerance Other (comment) (limited by dizziness)   Patient Left in chair;with call bell/phone within reach;with chair alarm set   Nurse Communication Mobility status        Time: 1610-9604 OT Time Calculation (min): 32 min  Charges: OT General Charges $OT Visit: 1 Visit OT Treatments $Self Care/Home Management : 23-37 mins  Linward Foster, MS, OTR/L   Alvester Morin 03/31/2023, 1:32 PM

## 2023-03-31 NOTE — TOC Progression Note (Signed)
Transition of Care [facility]) - Progression Note    Patient Details  Name: Lawrence [name] MRN: [id number] Date of Birth: 1956-09-24  [facility] CM/SW Contact  Truddie Hidden, RN Phone Number: 03/31/2023, 10:45 AM  Clinical Narrative:    Patient does not have any confirmed bed offers at this time. Requests are still pending.   Expected Discharge Plan: Skilled Nursing Facility Barriers to Discharge: Continued Medical Work up  Expected Discharge Plan and Services       Living arrangements for the past 2 months: Single Family Home                                       Social Determinants of Health (SDOH) Interventions SDOH Screenings   Food Insecurity: No Food Insecurity (03/30/2023)  Housing: Low Risk  (03/30/2023)  Transportation Needs: No Transportation Needs (03/30/2023)  Utilities: Not At Risk (03/30/2023)  Alcohol Screen: Low Risk  (03/10/2023)  Depression (PHQ2-9): Low Risk  (03/10/2023)  Financial Resource Strain: Low Risk  (03/10/2023)  Physical Activity: Inactive (03/10/2023)  Social Connections: Moderately Isolated (03/10/2023)  Stress: No Stress Concern Present (03/10/2023)  Tobacco Use: Medium Risk (03/30/2023)    Readmission Risk Interventions     No data to display

## 2023-03-31 NOTE — Progress Notes (Signed)
Physical Therapy Treatment Patient Details Name: Lawrence [name] MRN: [id number] DOB: Apr 11, 1956 Today's Date: 03/31/2023   History of Present Illness Pt admitted for syncope with fall at home and being unconscious for several minutes. History includes obesity, CAD s/p CABG x 2, HLD, HTN, and COPD. Cleared for mobility per RN.    PT Comments    Pt sleepy but eager to try walking and ultimately did well.  He continues to have low BP that stayed consistent with positional changes, no c/o dizziness initially in sitting, standing or first ~100 ft of ambulation; however he did have symptoms with increased distance and needed to sit rather abruptly.  BP again staying in the ~90/~50 range during ambulation, O2 in the high 90s on 2L and HR 60-70 at rest and up to 100-110 by end of ambulation.  Pt with no overt LOBs but dizziness, fatigue and continuing to be far from his baseline are all safety concerns.  Continue with POC per pt tolerance.     Recommendations for follow up therapy are one component of a multi-disciplinary discharge planning process, led by the attending physician.  Recommendations may be updated based on patient status, additional functional criteria and insurance authorization.  Follow Up Recommendations  Can patient physically be transported by private vehicle: No    Assistance Recommended at Discharge Intermittent Supervision/Assistance  Patient can return home with the following Help with stairs or ramp for entrance;Assist for transportation;A little help with walking and/or transfers;A little help with bathing/dressing/bathroom;Assistance with cooking/housework   Equipment Recommendations   (TBD)    Recommendations for Other Services       Precautions / Restrictions Precautions Precautions: Fall Restrictions Weight Bearing Restrictions: No     Mobility  Bed Mobility               General bed mobility comments: in recliner pre/post session     Transfers Overall transfer level: Needs assistance Equipment used: Rolling walker (2 wheels) Transfers: Sit to/from Stand Sit to Stand: Min guard   Step pivot transfers: Min guard       General transfer comment: Pt was able to rise to standing w/o direct assist, heavy UE use but w/o safety issue    Ambulation/Gait Ambulation/Gait assistance: Min guard, Min assist Gait Distance (Feet): 125 Feet Assistive device: Rolling walker (2 wheels)         General Gait Details: Pt reports no dizziness for first ~100 ft, then increasing dizziness and fatigue and finally need to sit.  He was able to maintain a relatively consistent cadence with appropriate UE use on walker (uses 4WW at home) but reports feeling much slower and less confident than his baseline.  Pt's BP checked multiple times during session (no c/o dizziness until the end) generally staying in the 90+-/50+- range   Stairs             Wheelchair Mobility    Modified Rankin (Stroke Patients Only)       Balance Overall balance assessment: History of Falls, Needs assistance Sitting-balance support: Feet supported Sitting balance-Leahy Scale: Good     Standing balance support: Bilateral upper extremity supported, During functional activity Standing balance-Leahy Scale: Fair Standing balance comment: relies on RW at baseline                            Cognition Arousal/Alertness: Lethargic, Suspect due to medications (sleeping soundly on arrival, eyes closed intermittently t/o the session) Behavior During  Therapy: WFL for tasks assessed/performed Overall Cognitive Status: Within Functional Limits for tasks assessed                                 General Comments: pleasant and follows commands well        Exercises      General Comments General comments (skin integrity, edema, etc.): Pt soundly asleep on arrival.  BP 97/63 in sitting, 87/51 on standing w/o sympoms on standing,  able to ambulate ~100 ft w/o issue then some dizziness (BP 90/48 standing at end of ambulation).  On return to sitting after ambulation BP 114/101.      Pertinent Vitals/Pain Pain Assessment Pain Assessment: Faces Faces Pain Scale: Hurts little more Pain Location: chest and lower back    Home Living                          Prior Function            PT Goals (current goals can now be found in the care plan section) Progress towards PT goals: Progressing toward goals    Frequency    Min 3X/week      PT Plan Current plan remains appropriate    Co-evaluation              AM-PAC PT "6 Clicks" Mobility   Outcome Measure  Help needed turning from your back to your side while in a flat bed without using bedrails?: A Little Help needed moving from lying on your back to sitting on the side of a flat bed without using bedrails?: A Little Help needed moving to and from a bed to a chair (including a wheelchair)?: A Little Help needed standing up from a chair using your arms (e.g., wheelchair or bedside chair)?: A Little Help needed to walk in hospital room?: A Lot Help needed climbing 3-5 steps with a railing? : A Lot 6 Click Score: 16    End of Session Equipment Utilized During Treatment: Gait belt Activity Tolerance: Patient tolerated treatment well Patient left: in chair;with call bell/phone within reach Nurse Communication: Mobility status PT Visit Diagnosis: Unsteadiness on feet (R26.81);Muscle weakness (generalized) (M62.81);Repeated falls (R29.6);Difficulty in walking, not elsewhere classified (R26.2);Pain Pain - Right/Left: Right Pain - part of body:  (chest and low back)     Time: 1734-1750 PT Time Calculation (min) (ACUTE ONLY): 16 min  Charges:  $Gait Training: 8-22 mins                     Malachi Pro, DPT 03/31/2023, 6:08 PM

## 2023-04-01 ENCOUNTER — Encounter: Payer: Self-pay | Admitting: Oncology

## 2023-04-01 DIAGNOSIS — R55 Syncope and collapse: Secondary | ICD-10-CM | POA: Diagnosis not present

## 2023-04-01 LAB — GLUCOSE, CAPILLARY
Glucose-Capillary: 158 mg/dL — ABNORMAL HIGH (ref 70–99)
Glucose-Capillary: 187 mg/dL — ABNORMAL HIGH (ref 70–99)
Glucose-Capillary: 202 mg/dL — ABNORMAL HIGH (ref 70–99)
Glucose-Capillary: 196 mg/dL — ABNORMAL HIGH (ref 70–99)
Glucose-Capillary: 170 mg/dL — ABNORMAL HIGH (ref 70–99)

## 2023-04-01 LAB — BASIC METABOLIC PANEL
Anion gap: 10 (ref 5–15)
BUN: 40 mg/dL — ABNORMAL HIGH (ref 8–23)
CO2: 35 mmol/L — ABNORMAL HIGH (ref 22–32)
Calcium: 9.3 mg/dL (ref 8.9–10.3)
Chloride: 89 mmol/L — ABNORMAL LOW (ref 98–111)
Creatinine, Ser: 1.27 mg/dL — ABNORMAL HIGH (ref 0.61–1.24)
GFR, Estimated: >60 mL/min (ref >60)
Glucose, Bld: 173 mg/dL — ABNORMAL HIGH (ref 70–99)
Potassium: 4 mmol/L (ref 3.5–5.1)
Sodium: 134 mmol/L — ABNORMAL LOW (ref 135–145)

## 2023-04-01 LAB — CBC WITH DIFFERENTIAL/PLATELET
Abs Immature Granulocytes: 0.13 10*3/uL — ABNORMAL HIGH (ref 0.00–0.07)
Basophils Absolute: 0 10*3/uL (ref 0.0–0.1)
Basophils Relative: 0 %
Eosinophils Absolute: 0.2 10*3/uL (ref 0.0–0.5)
Eosinophils Relative: 2 %
HCT: 51.8 % (ref 39.0–52.0)
Hemoglobin: 15.6 g/dL (ref 13.0–17.0)
Immature Granulocytes: 1 %
Lymphocytes Relative: 12 %
Lymphs Abs: 1.3 10*3/uL (ref 0.7–4.0)
MCH: 27 pg (ref 26.0–34.0)
MCHC: 30.1 g/dL (ref 30.0–36.0)
MCV: 89.8 fL (ref 80.0–100.0)
Monocytes Absolute: 1.2 10*3/uL — ABNORMAL HIGH (ref 0.1–1.0)
Monocytes Relative: 11 %
Neutro Abs: 8.1 10*3/uL — ABNORMAL HIGH (ref 1.7–7.7)
Neutrophils Relative %: 74 %
Platelets: 183 10*3/uL (ref 150–400)
RBC: 5.77 MIL/uL (ref 4.22–5.81)
RDW: 15.9 % — ABNORMAL HIGH (ref 11.5–15.5)
WBC: 10.9 10*3/uL — ABNORMAL HIGH (ref 4.0–10.5)
nRBC: 0 % (ref 0.0–0.2)

## 2023-04-01 LAB — URINALYSIS, COMPLETE (UACMP) WITH MICROSCOPIC
Bacteria, UA: NONE SEEN
Bilirubin Urine: NEGATIVE
Glucose, UA: >=500 mg/dL — AB
Hgb urine dipstick: NEGATIVE
Ketones, ur: NEGATIVE mg/dL
Leukocytes,Ua: NEGATIVE
Nitrite: NEGATIVE
Protein, ur: NEGATIVE mg/dL
Specific Gravity, Urine: 1.022 (ref 1.005–1.030)
pH: 5 (ref 5.0–8.0)

## 2023-04-01 LAB — AMMONIA: Ammonia: 10 umol/L (ref 9–35)

## 2023-04-01 MED ORDER — VENLAFAXINE HCL ER 75 MG PO CP24
75.0000 mg | ORAL_CAPSULE | Freq: Every day | ORAL | Status: DC
  Administered 2023-04-02 – 2023-04-05 (×4): 75 mg via ORAL
  Filled 2023-04-01 (×4): qty 1

## 2023-04-01 NOTE — Progress Notes (Signed)
PROGRESS NOTE    SUDAIS WIDMEYER  WJX:[id number] DOB: Jun 06, 1956 DOA: 03/28/2023 PCP: Malva Limes, MD    Brief Narrative:   Lawrence [name] is a [phone] with a PMH of CAD (h/o STEMI s/p CABG x2 LIMA-LAD and SVG-OM1 2002 c/b coronary artery dissection with multiple subsequent PCI and stents), chronic stable angina, HFpEF (50-55%, mild LVH 10/2022), permanent atrial fibrillation s/p unsuccessful DCCV 06/29/2022, COPD (PRN 2L), hx tobacco use, DM2 who presented to [facility] ED 03/28/2023 with syncope and chest pain. Patient was complaining of left-sided chest pain, increased with exertion and reports that morphine is the only thing that helps his chest pain. He was also complaining of multiple episodes of syncope over the last month.  Endorses prodromal symptoms with dizziness, nausea. Patient has multiple ED visits, 11 in March and 8 in April.  Multiple falls.  Most recent admission on 4/7 till 4/8 with chest pain.   5/13-5/14: MRI negative for any acute findings, vitals improving.  Clinically patient remains profoundly weak, unable to ambulate without max assist, high fall risk.  5/15: Patient remains orthostatic.  Pending bed offers for skilled nursing facility placement.  Assessment & Plan:   Principal Problem:   Syncope Active Problems:   Chest pain   Primary hypertension   Paroxysmal atrial fibrillation with RVR (HCC)   Hypertensive urgency   Chronic diastolic CHF (congestive heart failure) (HCC)   GERD without esophagitis   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HCC)   Chronic obstructive pulmonary disease (COPD) (HCC)   CKD (chronic kidney disease) stage 2, GFR 60-89 ml/min   Dyslipidemia   Coronary artery disease   Gout  Syncope, recurrent Acute ambulatory dysfunction Recurring issue with prior admissions for weakness, dizziness and falls in the setting of extensive cardiac history including systolic heart failure, atrial fibrillation, coronary artery disease, diastolic  CHF, hypertension 2D ECHO 10/2022 w/ EF 50-55% and normal valvular function  Orthostatics unremarkable Cardiology was also consulted-no further inpatient workup warranted PT is recommending SNF given patient's profound instability and falls Patient medically stable for discharge to skilled nursing facility.   Placing abdominal binder and TED hose today to minimize orthostatic drops.   Paroxysmal atrial fibrillation with RVR (HCC) Recently restarted on Eliquis May 1 in the setting of resolved thigh hematoma Continue Eliquis and amiodarone Follow clinically no signs or symptoms of bleeding at this time   Hypertensive urgency, resolved Primary hypertension Blood pressure elevated to 200/100 at intake, improved with resumption of home medications including lisinopril and metoprolol.  Improved control over interval.   GERD without esophagitis PPI   Chronic diastolic CHF (congestive heart failure) (HCC) Baseline ischemic cardiomyopathy/ Chronic diastolic CHF 2D echo December 2023 with EF 50 to 55% Appears euvolemic Continue home regimen   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HCC) SSI, hypoglycemic protocol ongoing A1c 6.20 February 2023   Gout Cont allopurinol   Coronary artery disease Baseline CAD s/p CABG x 2  Minimally elevated troponin, chronic Continue core measures as above(metoprolol, lisinopril, statin)   Dyslipidemia Continue statin   CKD (chronic kidney disease) stage 2, GFR 60-89 ml/min Cr 0.9  At baseline    Chronic obstructive pulmonary disease (COPD) (HCC) Stable from respiratory standpoint Continue home inhalers   DVT prophylaxis: Apixaban Code Status: DNR Family Communication: None Disposition Plan: Status is: Inpatient Remains inpatient appropriate because: Unsafe discharge plan.  Need skilled nursing facility placement.   Level of care: Telemetry Medical  Consultants:  None  Procedures:   None  Antimicrobials:  None   Subjective: Seen and examined.  Resting comfortably in bed.  No visible distress  Objective: Vitals:   04/01/23 0109 04/01/23 0435 04/01/23 0500 04/01/23 0744  BP:  (!) 92/57  96/67  Pulse: 62 69  70  Resp:  18  18  Temp:  98.6 F (37 C)  98 F (36.7 C)  TempSrc:  Oral    SpO2: 92% 100%  94%  Weight:   84.5 kg   Height:        Intake/Output Summary (Last 24 hours) at 04/01/2023 1023 Last data filed at 04/01/2023 0648 Gross per 24 hour  Intake 600 ml  Output 1325 ml  Net -725 ml   Filed Weights   03/27/23 2042 03/31/23 0500 04/01/23 0500  Weight: 89.4 kg 85.5 kg 84.5 kg    Examination:  General exam: NAD Respiratory system: Clear to auscultation. Respiratory effort normal. Cardiovascular system: S1-S2, RRR, no murmurs, no pedal edema Gastrointestinal system: Soft, NT/ND, normal bowel sounds Central nervous system: Alert and oriented. No focal neurological deficits. Extremities: Decreased power bilateral lower extremities.  Gait not assessed. Skin: No rashes, lesions or ulcers Psychiatry: Judgement and insight appear normal. Mood & affect appropriate.     Data Reviewed: I have personally reviewed following labs and imaging studies  CBC: Recent Labs  Lab 03/27/23 2036 03/29/23 0500  WBC 9.0 12.0*  HGB 14.1 15.7  HCT 46.1 50.7  MCV 89.3 88.3  PLT 220 244   Basic Metabolic Panel: Recent Labs  Lab 03/27/23 2036 03/29/23 0617  NA 137 137  K 4.4 4.9  CL 104 94*  CO2 26 34*  GLUCOSE 206* 156*  BUN 20 31*  CREATININE 0.91 1.21  CALCIUM 8.6* 8.6*   GFR: Estimated Creatinine Clearance: 59.9 mL/min (by C-G formula based on SCr of 1.21 mg/dL). Liver Function Tests: Recent Labs  Lab 03/29/23 0617  AST 22  ALT 20  ALKPHOS 49  BILITOT 1.3*  PROT 5.8*  ALBUMIN 3.2*   No results for input(s): "LIPASE", "AMYLASE" in the last 168 hours. No results for input(s): "AMMONIA" in the last 168 hours. Coagulation Profile: No  results for input(s): "INR", "PROTIME" in the last 168 hours. Cardiac Enzymes: No results for input(s): "CKTOTAL", "CKMB", "CKMBINDEX", "TROPONINI" in the last 168 hours. BNP (last 3 results) No results for input(s): "PROBNP" in the last 8760 hours. HbA1C: No results for input(s): "HGBA1C" in the last 72 hours. CBG: Recent Labs  Lab 03/27/23 2048 03/30/23 0745 03/31/23 0501 04/01/23 0453 04/01/23 0745  GLUCAP 213* 183* 174* 158* 187*   Lipid Profile: No results for input(s): "CHOL", "HDL", "LDLCALC", "TRIG", "CHOLHDL", "LDLDIRECT" in the last 72 hours. Thyroid Function Tests: No results for input(s): "TSH", "T4TOTAL", "FREET4", "T3FREE", "THYROIDAB" in the last 72 hours. Anemia Panel: No results for input(s): "VITAMINB12", "FOLATE", "FERRITIN", "TIBC", "IRON", "RETICCTPCT" in the last 72 hours. Sepsis Labs: No results for input(s): "PROCALCITON", "LATICACIDVEN" in the last 168 hours.  No results found for this or any previous visit (from the past 240 hour(s)).       Radiology Studies: No results found.      Scheduled Meds:  allopurinol  300 mg Oral BID   ALPRAZolam  1 mg Oral TID   amiodarone  200 mg Oral Daily   apixaban  5 mg Oral BID   aspirin  81 mg Oral Daily   atorvastatin  80 mg Oral Daily   dapagliflozin propanediol  10 mg Oral Daily   isosorbide mononitrate  60  mg Oral Daily   lisinopril  20 mg Oral Daily   metoprolol succinate  100 mg Oral Daily   pantoprazole  40 mg Oral Daily   ranolazine  1,000 mg Oral BID   sodium chloride flush  3 mL Intravenous Q12H   torsemide  50 mg Oral Daily   Continuous Infusions:   LOS: 1 day     Tresa Moore, MD [facility]   If 7PM-7AM, please contact night-coverage  04/01/2023, 10:23 AM

## 2023-04-01 NOTE — Progress Notes (Signed)
Physical Therapy Treatment Patient Details Name: Lawrence [name] MRN: [id number] DOB: [birth year] Today's Date: 04/01/2023   History of Present Illness Pt admitted for syncope with fall at home and being unconscious for several minutes. History includes obesity, CAD s/p CABG x 2, HLD, HTN, and COPD. Cleared for mobility per RN.    PT Comments    Pt received supine in bed appearing lethargic but agreeable to PT services. Pt relying on physical assist with all levels of mobility today appearing weaker and more deconditioned compared to prior sessions only being able to stand long enough to get standing BP reading and side step 3-4 steps to the R towards HOB before returning to supine.  In sitting pt was educated on abdominal binder and is indications, when/how to don/doff. Pt with negative orthostatics today but pt reports being dizzy with limited capacity to stand up tall needing frequent multimodal cuing for upright posture. Pt placed upright in bed to assist in BP regulation and pulmonary toileting. Re-educated on importance of upright sitting. D/c recs remain appropriate.   *See flowsheets for orthostatics*    Recommendations for follow up therapy are one component of a multi-disciplinary discharge planning process, led by the attending physician.  Recommendations may be updated based on patient status, additional functional criteria and insurance authorization.  Follow Up Recommendations  Can patient physically be transported by private vehicle: No    Assistance Recommended at Discharge Intermittent Supervision/Assistance  Patient can return home with the following Help with stairs or ramp for entrance;Assist for transportation;A little help with walking and/or transfers;A little help with bathing/dressing/bathroom;Assistance with cooking/housework   Equipment Recommendations  Other (comment) (TBD)    Recommendations for Other Services       Precautions / Restrictions  Precautions Precautions: Fall Restrictions Weight Bearing Restrictions: No     Mobility  Bed Mobility Overal bed mobility: Needs Assistance Bed Mobility: Supine to Sit, Sit to Supine     Supine to sit: Min assist, HOB elevated Sit to supine: Min assist     Patient Response: Cooperative  Transfers Overall transfer level: Needs assistance Equipment used: Rolling walker (2 wheels) Transfers: Sit to/from Stand Sit to Stand: Min assist                Ambulation/Gait                   Stairs             Wheelchair Mobility    Modified Rankin (Stroke Patients Only)       Balance Overall balance assessment: History of Falls, Needs assistance Sitting-balance support: Feet supported Sitting balance-Leahy Scale: Good     Standing balance support: Bilateral upper extremity supported, During functional activity Standing balance-Leahy Scale: Fair Standing balance comment: relies on RW at baseline                            Cognition Arousal/Alertness: Lethargic Behavior During Therapy: WFL for tasks assessed/performed Overall Cognitive Status: Within Functional Limits for tasks assessed                                 General Comments: appears lethargic        Exercises Other Exercises Other Exercises: provided abdominal binder instructing on its purpose, when/how to don.    General Comments        Pertinent Vitals/Pain Pain  Assessment Pain Assessment: Faces Faces Pain Scale: Hurts little more Pain Location: lower back Pain Descriptors / Indicators: Discomfort, Aching Pain Intervention(s): Limited activity within patient's tolerance, Monitored during session, Repositioned    Home Living                          Prior Function            PT Goals (current goals can now be found in the care plan section) Acute Rehab PT Goals Patient Stated Goal: to go to rehab PT Goal Formulation: With  patient Time For Goal Achievement: 04/11/23 Potential to Achieve Goals: Good Additional Goals Additional Goal #1: Pt will be able to perform bed mobility/transfers with mod I and safe technique in order to improve functional independence Progress towards PT goals: Progressing toward goals    Frequency    Min 3X/week      PT Plan Current plan remains appropriate    Co-evaluation              AM-PAC PT "6 Clicks" Mobility   Outcome Measure  Help needed turning from your back to your side while in a flat bed without using bedrails?: A Little Help needed moving from lying on your back to sitting on the side of a flat bed without using bedrails?: A Little Help needed moving to and from a bed to a chair (including a wheelchair)?: A Little Help needed standing up from a chair using your arms (e.g., wheelchair or bedside chair)?: A Lot Help needed to walk in hospital room?: A Lot Help needed climbing 3-5 steps with a railing? : A Lot 6 Click Score: 15    End of Session Equipment Utilized During Treatment: Gait belt (abdominal binder) Activity Tolerance: Patient tolerated treatment well Patient left: in bed;with call bell/phone within reach;with bed alarm set Nurse Communication: Mobility status PT Visit Diagnosis: Unsteadiness on feet (R26.81);Muscle weakness (generalized) (M62.81);Repeated falls (R29.6);Difficulty in walking, not elsewhere classified (R26.2);Pain     Time: 4098-1191 PT Time Calculation (min) (ACUTE ONLY): 21 min  Charges:  $Therapeutic Activity: 8-22 mins                     Delphia Grates. Fairly IV, PT, DPT Physical Therapist-   [facility]  04/01/2023, 3:38 PM

## 2023-04-01 NOTE — TOC Progression Note (Signed)
Transition of Care [facility]) - Progression Note    Patient Details  Name: Lawrence [name] MRN: [id number] Date of Birth: [birth year]  [facility] CM/SW Contact  Allena Katz, LCSW Phone Number: 04/01/2023, 3:02 PM  Clinical Narrative:   Gavin Pound at [facility] looking at pt. Gavin Pound states she has to see if patient can come with the current meds that pt is on as she reports they are expensive.     Expected Discharge Plan: Skilled Nursing Facility Barriers to Discharge: Continued Medical Work up  Expected Discharge Plan and Services       Living arrangements for the past 2 months: Single Family Home                                       Social Determinants of Health (SDOH) Interventions SDOH Screenings   Food Insecurity: No Food Insecurity (03/30/2023)  Housing: Low Risk  (03/30/2023)  Transportation Needs: No Transportation Needs (03/30/2023)  Utilities: Not At Risk (03/30/2023)  Alcohol Screen: Low Risk  (03/10/2023)  Depression (PHQ2-9): Low Risk  (03/10/2023)  Financial Resource Strain: Low Risk  (03/10/2023)  Physical Activity: Inactive (03/10/2023)  Social Connections: Moderately Isolated (03/10/2023)  Stress: No Stress Concern Present (03/10/2023)  Tobacco Use: Medium Risk (03/30/2023)    Readmission Risk Interventions     No data to display

## 2023-04-01 NOTE — Progress Notes (Signed)
PT Cancellation Note  Patient Details Name: Lawrence [name] MRN: [id number] DOB: Feb 05, 1956   Cancelled Treatment:    Reason Eval/Treat Not Completed: Other (comment). Pt received supine in bed agreeable to PT this afternoon. Chartered loss adjuster discussed with unit secretary on getting abdominal binder for pt due to assist in orthostatics. PT to hold at this time until abdominal binder is present.    Delphia Grates. Fairly IV, PT, DPT Physical Therapist- [facility]  [facility]  04/01/2023, 11:37 AM

## 2023-04-02 DIAGNOSIS — R55 Syncope and collapse: Secondary | ICD-10-CM | POA: Diagnosis not present

## 2023-04-02 LAB — GLUCOSE, CAPILLARY
Glucose-Capillary: 133 mg/dL — ABNORMAL HIGH (ref 70–99)
Glucose-Capillary: 139 mg/dL — ABNORMAL HIGH (ref 70–99)
Glucose-Capillary: 140 mg/dL — ABNORMAL HIGH (ref 70–99)
Glucose-Capillary: 162 mg/dL — ABNORMAL HIGH (ref 70–99)

## 2023-04-02 NOTE — Consult Note (Signed)
[facility] Customer service manager [facility]) Accountable Care Organization (ACO) [facility] Liaison Note  04/02/2023  [name] 1956-08-27 [id number]  Location: [facility] RN Hospital Liaison screened the patient remotely at [facility].  Insurance: [employer]   Lawrence [name] is a 67 y.o. male who is a Primary Care Patient of Sherrie Mustache, Demetrios Isaacs, MD. The patient was screened for readmission hospitalization with noted extreme risk score for unplanned readmission risk with 3 IP/17 ED in 6 months.  The patient was assessed for potential [facility]) Care Management service needs for post hospital transition for care coordination. Review of patient's electronic medical record reveals patient was admitted with Syncope. Current plans for SNF discharge. Facility will continue to manage pt's needs. [facility] PAC-RN does not follow [employer] facility SNF patients.  [facility] Care Management/Population Health does not replace or interfere with any arrangements made by the Inpatient Transition of Care team.   For questions contact:   Elliot Cousin, RN, BSN [facility] Liaison [place]   [facility]  Population Health Office Hours MTWF  8:00 am-6:00 pm Off on Thursday [phone] mobile [phone] [Office toll free line][facility] Office Hours are M-F 8:30 - 5 pm 24 hour nurse advise line [phone] Concierge  [email or web address]

## 2023-04-02 NOTE — Progress Notes (Signed)
PROGRESS NOTE    Lawrence [name]  ZOX:[id number] DOB: 02/14/1956 DOA: 03/28/2023 PCP: Malva Limes, MD    Brief Narrative:   Lawrence [name] is a [phone] with a PMH of CAD (h/o STEMI s/p CABG x2 LIMA-LAD and SVG-OM1 2002 c/b coronary artery dissection with multiple subsequent PCI and stents), chronic stable angina, HFpEF (50-55%, mild LVH 10/2022), permanent atrial fibrillation s/p unsuccessful DCCV 06/29/2022, COPD (PRN 2L), hx tobacco use, DM2 who presented to [facility] ED 03/28/2023 with syncope and chest pain. Patient was complaining of left-sided chest pain, increased with exertion and reports that morphine is the only thing that helps his chest pain. He was also complaining of multiple episodes of syncope over the last month.  Endorses prodromal symptoms with dizziness, nausea. Patient has multiple ED visits, 11 in March and 8 in April.  Multiple falls.  Most recent admission on 4/7 till 4/8 with chest pain.   5/13-5/14: MRI negative for any acute findings, vitals improving.  Clinically patient remains profoundly weak, unable to ambulate without max assist, high fall risk.  5/15: Patient remains orthostatic.  Pending bed offers for skilled nursing facility placement.  5/17: Patient remains medically stable for discharge.  Notified by [facility] that skilled nursing facility was citing increased cost of 3 medications that the patient was on.  Medications sided were Linzess, Marcelline Deist, Belsomra.  The patient does not take Linzess.  Belsomra can be substituted for another agent.  Marcelline Deist is goal-directed medical therapy for heart failure.  Assessment & Plan:   Principal Problem:   Syncope Active Problems:   Chest pain   Primary hypertension   Paroxysmal atrial fibrillation with RVR (HCC)   Hypertensive urgency   Chronic diastolic CHF (congestive heart failure) (HCC)   GERD without esophagitis   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HCC)   Chronic obstructive pulmonary disease (COPD) (HCC)    CKD (chronic kidney disease) stage 2, GFR 60-89 ml/min   Dyslipidemia   Coronary artery disease   Gout  Syncope, recurrent Acute ambulatory dysfunction Recurring issue with prior admissions for weakness, dizziness and falls in the setting of extensive cardiac history including systolic heart failure, atrial fibrillation, coronary artery disease, diastolic CHF, hypertension 2D ECHO 10/2022 w/ EF 50-55% and normal valvular function  Orthostatics unremarkable Cardiology was also consulted-no further inpatient workup warranted PT is recommending SNF given patient's profound instability and falls Patient medically stable for discharge to skilled nursing facility.   Placing abdominal binder and TED hose today to minimize orthostatic drops.   Paroxysmal atrial fibrillation with RVR (HCC) Recently restarted on Eliquis May 1 in the setting of resolved thigh hematoma Continue Eliquis and amiodarone Follow clinically no signs or symptoms of bleeding at this time   Hypertensive urgency, resolved Primary hypertension Blood pressure elevated to 200/100 at intake, improved with resumption of home medications including lisinopril and metoprolol.  Improved control over interval.   GERD without esophagitis PPI   Chronic diastolic CHF (congestive heart failure) (HCC) Baseline ischemic cardiomyopathy/ Chronic diastolic CHF 2D echo December 2023 with EF 50 to 55% Appears euvolemic Continue home regimen Farxiga to be continued.  This is goal-directed medical therapy   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HCC) SSI, hypoglycemic protocol ongoing A1c 6.20 February 2023   Gout Cont allopurinol   Coronary artery disease Baseline CAD s/p CABG x 2  Minimally elevated troponin, chronic Continue core measures as above(metoprolol, lisinopril, statin)   Dyslipidemia Continue statin   CKD (chronic kidney disease) stage 2, GFR 60-89  ml/min Cr 0.9  At baseline    Chronic obstructive pulmonary  disease (COPD) (HCC) Stable from respiratory standpoint Continue home inhalers     DVT prophylaxis: Apixaban Code Status: DNR Family Communication: None Disposition Plan: Status is: Inpatient Remains inpatient appropriate because: Unsafe discharge plan.  Need skilled nursing facility placement.  Medically stable   Level of care: Telemetry Medical  Consultants:  None  Procedures:  None  Antimicrobials: None   Subjective: Seen and examined.  Resting comfortably in bed.  No visible distress  Objective: Vitals:   04/01/23 2016 04/01/23 2325 04/02/23 0518 04/02/23 0906  BP: 132/87 124/86 118/75 (!) 164/88  Pulse: 69 (!) 43 74 62  Resp: 12 16 16 20   Temp: 97.6 F (36.4 C) 98.2 F (36.8 C) 98.7 F (37.1 C) 98.1 F (36.7 C)  TempSrc:   Oral   SpO2: 96% 94% 91% 93%  Weight:      Height:        Intake/Output Summary (Last 24 hours) at 04/02/2023 1044 Last data filed at 04/02/2023 1000 Gross per 24 hour  Intake 240 ml  Output 525 ml  Net -285 ml   Filed Weights   03/27/23 2042 03/31/23 0500 04/01/23 0500  Weight: 89.4 kg 85.5 kg 84.5 kg    Examination:  General exam: No acute distress Respiratory system: Lungs clear.  Normal work of breathing.  Room air Cardiovascular system: S1-S2, RRR, no murmurs, no pedal edema Gastrointestinal system: Soft, NT/ND, normal bowel sounds Central nervous system: Alert and oriented. No focal neurological deficits. Extremities: Decreased power bilateral lower extremities.  Gait not assessed. Skin: No rashes, lesions or ulcers Psychiatry: Judgement and insight appear normal. Mood & affect appropriate.     Data Reviewed: I have personally reviewed following labs and imaging studies  CBC: Recent Labs  Lab 03/27/23 2036 03/29/23 0500 04/01/23 1813  WBC 9.0 12.0* 10.9*  NEUTROABS  --   --  8.1*  HGB 14.1 15.7 15.6  HCT 46.1 50.7 51.8  MCV 89.3 88.3 89.8  PLT 220 244 183   Basic Metabolic Panel: Recent Labs  Lab  03/27/23 2036 03/29/23 0617 04/01/23 1813  NA 137 137 134*  K 4.4 4.9 4.0  CL 104 94* 89*  CO2 26 34* 35*  GLUCOSE 206* 156* 173*  BUN 20 31* 40*  CREATININE 0.91 1.21 1.27*  CALCIUM 8.6* 8.6* 9.3   GFR: Estimated Creatinine Clearance: 57.1 mL/min (A) (by C-G formula based on SCr of 1.27 mg/dL (H)). Liver Function Tests: Recent Labs  Lab 03/29/23 0617  AST 22  ALT 20  ALKPHOS 49  BILITOT 1.3*  PROT 5.8*  ALBUMIN 3.2*   No results for input(s): "LIPASE", "AMYLASE" in the last 168 hours. Recent Labs  Lab 04/01/23 1813  AMMONIA 10   Coagulation Profile: No results for input(s): "INR", "PROTIME" in the last 168 hours. Cardiac Enzymes: No results for input(s): "CKTOTAL", "CKMB", "CKMBINDEX", "TROPONINI" in the last 168 hours. BNP (last 3 results) No results for input(s): "PROBNP" in the last 8760 hours. HbA1C: No results for input(s): "HGBA1C" in the last 72 hours. CBG: Recent Labs  Lab 04/01/23 1127 04/01/23 1621 04/01/23 2115 04/02/23 0613 04/02/23 0908  GLUCAP 202* 196* 170* 133* 139*   Lipid Profile: No results for input(s): "CHOL", "HDL", "LDLCALC", "TRIG", "CHOLHDL", "LDLDIRECT" in the last 72 hours. Thyroid Function Tests: No results for input(s): "TSH", "T4TOTAL", "FREET4", "T3FREE", "THYROIDAB" in the last 72 hours. Anemia Panel: No results for input(s): "VITAMINB12", "FOLATE", "FERRITIN", "TIBC", "  IRON", "RETICCTPCT" in the last 72 hours. Sepsis Labs: No results for input(s): "PROCALCITON", "LATICACIDVEN" in the last 168 hours.  No results found for this or any previous visit (from the past 240 hour(s)).       Radiology Studies: No results found.      Scheduled Meds:  allopurinol  300 mg Oral BID   ALPRAZolam  1 mg Oral TID   amiodarone  200 mg Oral Daily   apixaban  5 mg Oral BID   aspirin  81 mg Oral Daily   atorvastatin  80 mg Oral Daily   dapagliflozin propanediol  10 mg Oral Daily   isosorbide mononitrate  60 mg Oral Daily    lisinopril  20 mg Oral Daily   metoprolol succinate  100 mg Oral Daily   pantoprazole  40 mg Oral Daily   ranolazine  1,000 mg Oral BID   sodium chloride flush  3 mL Intravenous Q12H   torsemide  50 mg Oral Daily   venlafaxine XR  75 mg Oral Q breakfast   Continuous Infusions:   LOS: 2 days     Tresa Moore, MD [facility]   If 7PM-7AM, please contact night-coverage  04/02/2023, 10:44 AM

## 2023-04-02 NOTE — Progress Notes (Signed)
Patient has been medically ready for discharge since 5/15.  Discharge delayed by SNF claiming medications are too expensive.  Reviewed perspective discharge medications closely.  Medication specifically reference for Ranexa, Marcelline Deist, Linzess, Belsomra.  Patient no longer takes Linzess.  Marcelline Deist can be held for the duration of the skilled nursing facility stay, Belsomra can be substituted for less expensive option, Ranexa will need to be continued.  For some reason the skilled nursing facility is claiming that the cost of these medications is excessive despite me being able to find a cash price for significantly less then they are claiming.  Discharge has been delayed 5 days for reasons outside of our control.  Skilled nursing facility reportedly will accept patient on 5/20 stating that is when they can acquire the medications.

## 2023-04-02 NOTE — TOC Progression Note (Signed)
Transition of Care [facility]) - Progression Note    Patient Details  Name: Lawrence [name] MRN: [id number] Date of Birth: 1956/07/30  Transition of Care [facility]) CM/SW Contact  Allena Katz, LCSW Phone Number: 04/02/2023, 11:36 AM  Clinical Narrative:   [facility] has declined patient due to medication costs. CSW will send referral out to additional facilities and communicate to patient and family.     Expected Discharge Plan: Skilled Nursing Facility Barriers to Discharge: Continued Medical Work up  Expected Discharge Plan and Services       Living arrangements for the past 2 months: Single Family Home                                       Social Determinants of Health (SDOH) Interventions SDOH Screenings   Food Insecurity: No Food Insecurity (03/30/2023)  Housing: Low Risk  (03/30/2023)  Transportation Needs: No Transportation Needs (03/30/2023)  Utilities: Not At Risk (03/30/2023)  Alcohol Screen: Low Risk  (03/10/2023)  Depression (PHQ2-9): Low Risk  (03/10/2023)  Financial Resource Strain: Low Risk  (03/10/2023)  Physical Activity: Inactive (03/10/2023)  Social Connections: Moderately Isolated (03/10/2023)  Stress: No Stress Concern Present (03/10/2023)  Tobacco Use: Medium Risk (03/30/2023)    Readmission Risk Interventions     No data to display

## 2023-04-02 NOTE — Progress Notes (Signed)
Initial Nutrition Assessment  DOCUMENTATION CODES:   Not applicable  INTERVENTION:  - Continue heart healthy/CHO mod diet.   NUTRITION DIAGNOSIS:  No nutrition diagnosis at this time.   REASON FOR ASSESSMENT:   Malnutrition Screening Tool    ASSESSMENT:   67 y.o. male admits related to loss of consciousness. PMH includes: afib, anemia, anginal pain, CAD, CHF, COPD, GERD, MI, pancreatitis, T2DM. Pt is currently receiving medical management related to syncope.  Meds reviewed: lipitor. Labs reviewed: Na low, BUN/Creatinine high.   Pt reports that he has a good appetite and has been eating well since admission. Pt also reports that he was eating well PTA as well. He denies any wt loss. RD will continue to monitor PO intakes.  NUTRITION - FOCUSED PHYSICAL EXAM:  WDL - no wasting noted.   Diet Order:   Diet Order             Diet heart healthy/carb modified Room service appropriate? Yes; Fluid consistency: Thin  Diet effective now                   EDUCATION NEEDS:      Skin:  Skin Assessment: Reviewed RN Assessment  Last BM:  5/14  Height:   Ht Readings from Last 1 Encounters:  03/27/23 5\' 7"  (1.702 m)    Weight:   Wt Readings from Last 1 Encounters:  04/01/23 84.5 kg    Ideal Body Weight:     BMI:  Body mass index is 29.18 kg/m.  Estimated Nutritional Needs:   Kcal:  2115-2535 kcals  Protein:  105-125 gm  Fluid:  2 L/day  Bethann Humble, RD, LDN, CNSC.

## 2023-04-02 NOTE — Significant Event (Deleted)
[facility] had initially declined due to medication cost.  I carefully reviewed the patient's discharge medications.  Medications specifically referenced by [facility] were Ranexa, Marcelline Deist, Belsomra, Linzess.  On my review of the medical record the patient is no longer taking Linzess so this will be removed.  Belsomra can be substituted for a lower cost agent.  Ranexa is for peripheral arterial disease, is available as a generic and needs to be given.  Marcelline Deist will be held for the duration of his skilled nursing facility stay.  Informed by [facility] that [facility] willing to accept patient but cannot until Monday 5/20.  Patient has been medically stable for discharge from the hospital since 5/15.  We will document this as a delayed discharge due to unsafe disposition plan.  Lolita Patella MD  No charge

## 2023-04-02 NOTE — TOC Progression Note (Signed)
Transition of Care [facility]) - Progression Note    Patient Details  Name: Lawrence [name] MRN: [id number] Date of Birth: Oct 25, 1956  Transition [facility]) CM/SW Contact  Allena Katz, LCSW Phone Number: 04/02/2023, 2:46 PM  Clinical Narrative:   Facility unable to accept until Monday, CSW to start auth for Monday.     Expected Discharge Plan: Skilled Nursing Facility Barriers to Discharge: Continued Medical Work up  Expected Discharge Plan and Services       Living arrangements for the past 2 months: Single Family Home                                       Social Determinants of Health (SDOH) Interventions SDOH Screenings   Food Insecurity: No Food Insecurity (03/30/2023)  Housing: Low Risk  (03/30/2023)  Transportation Needs: No Transportation Needs (03/30/2023)  Utilities: Not At Risk (03/30/2023)  Alcohol Screen: Low Risk  (03/10/2023)  Depression (PHQ2-9): Low Risk  (03/10/2023)  Financial Resource Strain: Low Risk  (03/10/2023)  Physical Activity: Inactive (03/10/2023)  Social Connections: Moderately Isolated (03/10/2023)  Stress: No Stress Concern Present (03/10/2023)  Tobacco Use: Medium Risk (03/30/2023)    Readmission Risk Interventions     No data to display

## 2023-04-03 DIAGNOSIS — R55 Syncope and collapse: Secondary | ICD-10-CM | POA: Diagnosis not present

## 2023-04-03 LAB — GLUCOSE, CAPILLARY
Glucose-Capillary: 105 mg/dL — ABNORMAL HIGH (ref 70–99)
Glucose-Capillary: 120 mg/dL — ABNORMAL HIGH (ref 70–99)
Glucose-Capillary: 175 mg/dL — ABNORMAL HIGH (ref 70–99)

## 2023-04-03 MED ORDER — SODIUM CHLORIDE 0.9 % IV BOLUS
250.0000 mL | Freq: Once | INTRAVENOUS | Status: AC
  Administered 2023-04-03: 250 mL via INTRAVENOUS

## 2023-04-03 NOTE — Progress Notes (Signed)
       CROSS COVER NOTE  NAME: Lawrence [name] MRN: [id number] DOB : [birth year]    HPI/Events of Note   Report:soft pressures and dizziness with turning  On review of chart:on multiple narcotics and sedating meds    Assessment and  Interventions   Assessment:  Plan: Hold sedating meds  500 ml NS bolus       Donnie Mesa NP [facility] Cross Cover 7pm-7am - check amion for availability Pager [phone]

## 2023-04-03 NOTE — Progress Notes (Signed)
PROGRESS NOTE    Lawrence [name]  ZOX:[id number] DOB: [birth year] DOA: 03/28/2023 PCP: Malva Limes, MD    Brief Narrative:   Lawrence [name] is a [phone] with a PMH of CAD (h/o STEMI s/p CABG x2 LIMA-LAD and SVG-OM1 2002 c/b coronary artery dissection with multiple subsequent PCI and stents), chronic stable angina, HFpEF (50-55%, mild LVH 10/2022), permanent atrial fibrillation s/p unsuccessful DCCV 06/29/2022, COPD (PRN 2L), hx tobacco use, DM2 who presented to [facility] ED 03/28/2023 with syncope and chest pain. Patient was complaining of left-sided chest pain, increased with exertion and reports that morphine is the only thing that helps his chest pain. He was also complaining of multiple episodes of syncope over the last month.  Endorses prodromal symptoms with dizziness, nausea. Patient has multiple ED visits, 11 in March and 8 in April.  Multiple falls.  Most recent admission on 4/7 till 4/8 with chest pain.   5/13-5/14: MRI negative for any acute findings, vitals improving.  Clinically patient remains profoundly weak, unable to ambulate without max assist, high fall risk.  5/15: Patient remains orthostatic.  Pending bed offers for skilled nursing facility placement.  5/17: Patient remains medically stable for discharge.  Notified by [facility] that skilled nursing facility was citing increased cost of 3 medications that the patient was on.  Medications sided were Linzess, Marcelline Deist, Belsomra.  The patient does not take Linzess.  Belsomra can be substituted for another agent.  Marcelline Deist is goal-directed medical therapy for heart failure.  5/18: SNF will take patient Monday 5/20  Assessment & Plan:   Principal Problem:   Syncope Active Problems:   Chest pain   Primary hypertension   Paroxysmal atrial fibrillation with RVR (HCC)   Hypertensive urgency   Chronic diastolic CHF (congestive heart failure) (HCC)   GERD without esophagitis   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HCC)   Chronic  obstructive pulmonary disease (COPD) (HCC)   CKD (chronic kidney disease) stage 2, GFR 60-89 ml/min   Dyslipidemia   Coronary artery disease   Gout  Syncope, recurrent Acute ambulatory dysfunction Recurring issue with prior admissions for weakness, dizziness and falls in the setting of extensive cardiac history including systolic heart failure, atrial fibrillation, coronary artery disease, diastolic CHF, hypertension 2D ECHO 10/2022 w/ EF 50-55% and normal valvular function  Orthostatics unremarkable Cardiology was also consulted-no further inpatient workup warranted PT is recommending SNF given patient's profound instability and falls Patient medically stable for discharge to skilled nursing facility.   Placing abdominal binder and TED hose today to minimize orthostatic drops.   Paroxysmal atrial fibrillation with RVR (HCC) Recently restarted on Eliquis May 1 in the setting of resolved thigh hematoma Continue Eliquis and amiodarone Follow clinically no signs or symptoms of bleeding at this time   Hypertensive urgency, resolved Primary hypertension Blood pressure elevated to 200/100 at intake, improved with resumption of home medications including lisinopril and metoprolol.  Improved control over interval.  Patient has been running hypotensive, likely due to sedating meds   GERD without esophagitis PPI   Chronic diastolic CHF (congestive heart failure) (HCC) Baseline ischemic cardiomyopathy/ Chronic diastolic CHF 2D echo December 2023 with EF 50 to 55% Appears euvolemic Continue home regimen Marcelline Deist ok to hold while patient at SNF   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HCC) SSI, hypoglycemic protocol ongoing A1c 6.20 February 2023   Gout Cont allopurinol   Coronary artery disease Baseline CAD s/p CABG x 2  Minimally elevated troponin, chronic Continue core measures as above(metoprolol,  lisinopril, statin)   Dyslipidemia Continue statin   CKD (chronic kidney  disease) stage 2, GFR 60-89 ml/min Cr 0.9  At baseline    Chronic obstructive pulmonary disease (COPD) (HCC) Stable from respiratory standpoint Continue home inhalers     DVT prophylaxis: Apixaban Code Status: DNR Family Communication: None Disposition Plan: Status is: Inpatient Remains inpatient appropriate because: Unsafe discharge plan.  Need skilled nursing facility placement.  Medically stable   Level of care: Telemetry Medical  Consultants:  None  Procedures:  None  Antimicrobials: None   Subjective: Seen and examined.  Resting comfortably in bed.  No visible distress  Objective: Vitals:   04/03/23 0617 04/03/23 0625 04/03/23 0642 04/03/23 0806  BP: (!) 81/46 91/64 99/69  (!) 78/59  Pulse:  (!) 56 (!) 54 83  Resp:    18  Temp:    97.9 F (36.6 C)  TempSrc:      SpO2:    (!) 83%  Weight:      Height:        Intake/Output Summary (Last 24 hours) at 04/03/2023 1048 Last data filed at 04/03/2023 0900 Gross per 24 hour  Intake 960 ml  Output 1800 ml  Net -840 ml   Filed Weights   03/31/23 0500 04/01/23 0500 04/03/23 0500  Weight: 85.5 kg 84.5 kg 84.1 kg    Examination:  General exam: NAD Respiratory system: Lungs clear.  Normal work of breathing.  Room air Cardiovascular system: S1-S2, RRR, no murmurs, no pedal edema Gastrointestinal system: Soft, NT/ND, normal bowel sounds Central nervous system: Alert and oriented. No focal neurological deficits. Extremities: Decreased power bilateral lower extremities.  Gait not assessed. Skin: No rashes, lesions or ulcers Psychiatry: Judgement and insight appear normal. Mood & affect appropriate.     Data Reviewed: I have personally reviewed following labs and imaging studies  CBC: Recent Labs  Lab 03/27/23 2036 03/29/23 0500 04/01/23 1813  WBC 9.0 12.0* 10.9*  NEUTROABS  --   --  8.1*  HGB 14.1 15.7 15.6  HCT 46.1 50.7 51.8  MCV 89.3 88.3 89.8  PLT 220 244 183   Basic Metabolic Panel: Recent  Labs  Lab 03/27/23 2036 03/29/23 0617 04/01/23 1813  NA 137 137 134*  K 4.4 4.9 4.0  CL 104 94* 89*  CO2 26 34* 35*  GLUCOSE 206* 156* 173*  BUN 20 31* 40*  CREATININE 0.91 1.21 1.27*  CALCIUM 8.6* 8.6* 9.3   GFR: Estimated Creatinine Clearance: 56.9 mL/min (A) (by C-G formula based on SCr of 1.27 mg/dL (H)). Liver Function Tests: Recent Labs  Lab 03/29/23 0617  AST 22  ALT 20  ALKPHOS 49  BILITOT 1.3*  PROT 5.8*  ALBUMIN 3.2*   No results for input(s): "LIPASE", "AMYLASE" in the last 168 hours. Recent Labs  Lab 04/01/23 1813  AMMONIA 10   Coagulation Profile: No results for input(s): "INR", "PROTIME" in the last 168 hours. Cardiac Enzymes: No results for input(s): "CKTOTAL", "CKMB", "CKMBINDEX", "TROPONINI" in the last 168 hours. BNP (last 3 results) No results for input(s): "PROBNP" in the last 8760 hours. HbA1C: No results for input(s): "HGBA1C" in the last 72 hours. CBG: Recent Labs  Lab 04/02/23 0908 04/02/23 1140 04/02/23 1659 04/03/23 0518 04/03/23 0804  GLUCAP 139* 140* 162* 105* 120*   Lipid Profile: No results for input(s): "CHOL", "HDL", "LDLCALC", "TRIG", "CHOLHDL", "LDLDIRECT" in the last 72 hours. Thyroid Function Tests: No results for input(s): "TSH", "T4TOTAL", "FREET4", "T3FREE", "THYROIDAB" in the last 72 hours. Anemia  Panel: No results for input(s): "VITAMINB12", "FOLATE", "FERRITIN", "TIBC", "IRON", "RETICCTPCT" in the last 72 hours. Sepsis Labs: No results for input(s): "PROCALCITON", "LATICACIDVEN" in the last 168 hours.  No results found for this or any previous visit (from the past 240 hour(s)).       Radiology Studies: No results found.      Scheduled Meds:  allopurinol  300 mg Oral BID   ALPRAZolam  1 mg Oral TID   amiodarone  200 mg Oral Daily   apixaban  5 mg Oral BID   aspirin  81 mg Oral Daily   atorvastatin  80 mg Oral Daily   dapagliflozin propanediol  10 mg Oral Daily   isosorbide mononitrate  60 mg Oral  Daily   lisinopril  20 mg Oral Daily   metoprolol succinate  100 mg Oral Daily   pantoprazole  40 mg Oral Daily   ranolazine  1,000 mg Oral BID   sodium chloride flush  3 mL Intravenous Q12H   torsemide  50 mg Oral Daily   venlafaxine XR  75 mg Oral Q breakfast   Continuous Infusions:   LOS: 3 days     Tresa Moore, MD [facility]   If 7PM-7AM, please contact night-coverage  04/03/2023, 10:48 AM

## 2023-04-04 DIAGNOSIS — R55 Syncope and collapse: Secondary | ICD-10-CM | POA: Diagnosis not present

## 2023-04-04 LAB — GLUCOSE, CAPILLARY
Glucose-Capillary: 120 mg/dL — ABNORMAL HIGH (ref 70–99)
Glucose-Capillary: 168 mg/dL — ABNORMAL HIGH (ref 70–99)

## 2023-04-04 NOTE — Progress Notes (Signed)
Mobility Specialist - Progress Note   Othostatic: Laying down: BP(121/67), SpO2(94) on 2L Sitting EOB: BP(113/75), Standing for 45 seconds: BP(108/62)      04/04/23 1000  Mobility  Activity Ambulated with assistance in room  Level of Assistance Minimal assist, patient does 75% or more  Assistive Device Front wheel walker  Range of Motion/Exercises Active  Activity Response Tolerated well  Mobility Referral Yes  $Mobility charge 1 Mobility  Mobility Specialist Start Time (ACUTE ONLY) 0900  Mobility Specialist Stop Time (ACUTE ONLY) 1005  Mobility Specialist Time Calculation (min) (ACUTE ONLY) 65 min   Pt resting in bed on 2L upon entry. Pt performed bed mobility indep. Pt STS x2 with orthostatic bp's taken during session. Pt endorses dizziness in standing. Pt returned to sitting and performed assisted hygiene clean (blood on chin from excessive scratching; bordered foam applied per RN to minimize further bleeding). Pt left with needs in reach and bed alarm activated.   Johnathan Hausen Mobility Specialist 04/04/23, 10:34 AM

## 2023-04-04 NOTE — Progress Notes (Addendum)
Physical Therapy Treatment Patient Details Name: Lawrence [name] MRN: [id number] DOB: 12/22/1955 Today's Date: 04/04/2023   History of Present Illness Pt admitted for syncope with fall at home and being unconscious for several minutes. History includes obesity, CAD s/p CABG x 2, HLD, HTN, and COPD. Cleared for mobility per RN.    PT Comments    Pt seen for PT tx with pt agreeable. Pt endorsing L sided stabbing chest pain but notes this has been ongoing since arrival - nurse notified. Pt is able to complete supine<>sit with supervision with bed rails, HOB slightly elevated. Pt transfers STS with min assist & engages in marching while standing EOB with BUE on RW & min assist as pt reports he doesn't feel he can walk today. Pt with c/o dizziness sitting & standing but BP relatively stable even without ted hose & abdominal binder in room (unable to locate either in pt's room). Pt notes significant fatigue at end of session & assisted back to bed. Notified nursing staff of pt's need for recliner so he can sit up throughout the day.  BP checked in LUE  BP HR  Supine     Sitting 109/62 mmHg MAP 76   Standing at 0 105/63 mmHg MAP 75   Standing at 3 minutes    Pt on 2L/min via nasal cannula with SpO2 >90%     Recommendations for follow up therapy are one component of a multi-disciplinary discharge planning process, led by the attending physician.  Recommendations may be updated based on patient status, additional functional criteria and insurance authorization.  Follow Up Recommendations  Can patient physically be transported by private vehicle: No    Assistance Recommended at Discharge Intermittent Supervision/Assistance  Patient can return home with the following Help with stairs or ramp for entrance;Assist for transportation;A little help with walking and/or transfers;A little help with bathing/dressing/bathroom;Assistance with cooking/housework   Equipment Recommendations  None recommended  by PT (TBD in next venue)    Recommendations for Other Services       Precautions / Restrictions Precautions Precautions: Fall Restrictions Weight Bearing Restrictions: No     Mobility  Bed Mobility Overal bed mobility: Needs Assistance Bed Mobility: Supine to Sit, Sit to Supine     Supine to sit: Supervision, HOB elevated Sit to supine: Supervision, HOB elevated        Transfers Overall transfer level: Needs assistance Equipment used: Rolling walker (2 wheels) Transfers: Sit to/from Stand Sit to Stand: Min assist, Min guard           General transfer comment: STS from EOB multiple times with min cuing to push up to standing with 1UE    Ambulation/Gait                   Stairs             Wheelchair Mobility    Modified Rankin (Stroke Patients Only)       Balance Overall balance assessment: History of Falls, Needs assistance   Sitting balance-Leahy Scale: Fair     Standing balance support: Bilateral upper extremity supported, During functional activity, Reliant on assistive device for balance Standing balance-Leahy Scale: Fair                              Cognition Arousal/Alertness: Awake/alert Behavior During Therapy: WFL for tasks assessed/performed Overall Cognitive Status: Within Functional Limits for tasks assessed  General Comments: appears fatigued        Exercises General Exercises - Lower Extremity Long Arc Quad: AROM, Seated, Strengthening, Both, 10 reps Hip Flexion/Marching:  (Pt performed 2 rounds of standing marching with cuing for increased ROM & BUE support on RW, min assist for balance. Pt performed until fatigued & had to return to sitting.)    General Comments        Pertinent Vitals/Pain Pain Assessment Pain Assessment: 0-10 Pain Score: 9  Pain Location: L sided stabbing chest pain (nurse notified) but pt reports this has been ongoing since  admission, also c/o unrated back pain Pain Descriptors / Indicators: Stabbing, Discomfort Pain Intervention(s): Monitored during session (nurse notified)    Home Living                          Prior Function            PT Goals (current goals can now be found in the care plan section) Acute Rehab PT Goals Patient Stated Goal: to go to rehab PT Goal Formulation: With patient Time For Goal Achievement: 04/11/23 Potential to Achieve Goals: Good Additional Goals Additional Goal #1: Pt will be able to perform bed mobility/transfers with mod I and safe technique in order to improve functional independence Progress towards PT goals: Progressing toward goals    Frequency    Min 3X/week      PT Plan Current plan remains appropriate    Co-evaluation              AM-PAC PT "6 Clicks" Mobility   Outcome Measure  Help needed turning from your back to your side while in a flat bed without using bedrails?: A Little Help needed moving from lying on your back to sitting on the side of a flat bed without using bedrails?: A Little Help needed moving to and from a bed to a chair (including a wheelchair)?: A Little Help needed standing up from a chair using your arms (e.g., wheelchair or bedside chair)?: A Lot Help needed to walk in hospital room?: A Lot Help needed climbing 3-5 steps with a railing? : A Lot 6 Click Score: 15    End of Session   Activity Tolerance: Patient tolerated treatment well;Patient limited by fatigue Patient left: in bed;with call bell/phone within reach;with bed alarm set Nurse Communication:  (notified nurse of pt's c/o L sided chest pain 9/10; need for recliner in room so pt can sit OOB) PT Visit Diagnosis: Unsteadiness on feet (R26.81);Muscle weakness (generalized) (M62.81);Repeated falls (R29.6);Difficulty in walking, not elsewhere classified (R26.2);Pain Pain - part of body:  (L side of chest, back)     Time: 1610-9604 PT Time  Calculation (min) (ACUTE ONLY): 16 min  Charges:  $Therapeutic Activity: 8-22 mins                     Aleda Grana, PT, DPT 04/04/23, 1:36 PM   Sandi Mariscal 04/04/2023, 1:28 PM

## 2023-04-04 NOTE — Progress Notes (Signed)
PROGRESS NOTE    [name]  IHK:[id number] DOB: January 23, 1956 DOA: 03/28/2023 PCP: Malva Limes, MD    Brief Narrative:   Lawrence [name] is a [phone] with a PMH of CAD (h/o STEMI s/p CABG x2 LIMA-LAD and SVG-OM1 2002 c/b coronary artery dissection with multiple subsequent PCI and stents), chronic stable angina, HFpEF (50-55%, mild LVH 10/2022), permanent atrial fibrillation s/p unsuccessful DCCV 06/29/2022, COPD (PRN 2L), hx tobacco use, DM2 who presented to [facility] ED 03/28/2023 with syncope and chest pain. Patient was complaining of left-sided chest pain, increased with exertion and reports that morphine is the only thing that helps his chest pain. He was also complaining of multiple episodes of syncope over the last month.  Endorses prodromal symptoms with dizziness, nausea. Patient has multiple ED visits, 11 in March and 8 in April.  Multiple falls.  Most recent admission on 4/7 till 4/8 with chest pain.   5/13-5/14: MRI negative for any acute findings, vitals improving.  Clinically patient remains profoundly weak, unable to ambulate without max assist, high fall risk.  5/15: Patient remains orthostatic.  Pending bed offers for skilled nursing facility placement.  5/17: Patient remains medically stable for discharge.  Notified by [facility] that skilled nursing facility was citing increased cost of 3 medications that the patient was on.  Medications sided were Linzess, Marcelline Deist, Belsomra.  The patient does not take Linzess.  Belsomra can be substituted for another agent.  Marcelline Deist is goal-directed medical therapy for heart failure.  5/18: SNF will take patient Monday 5/20  Assessment & Plan:   Principal Problem:   Syncope Active Problems:   Chest pain   Primary hypertension   Paroxysmal atrial fibrillation with RVR (HCC)   Hypertensive urgency   Chronic diastolic CHF (congestive heart failure) (HCC)   GERD without esophagitis   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HCC)   Chronic  obstructive pulmonary disease (COPD) (HCC)   CKD (chronic kidney disease) stage 2, GFR 60-89 ml/min   Dyslipidemia   Coronary artery disease   Gout  Syncope, recurrent Acute ambulatory dysfunction Recurring issue with prior admissions for weakness, dizziness and falls in the setting of extensive cardiac history including systolic heart failure, atrial fibrillation, coronary artery disease, diastolic CHF, hypertension 2D ECHO 10/2022 w/ EF 50-55% and normal valvular function  Orthostatics unremarkable Cardiology was also consulted-no further inpatient workup warranted PT is recommending SNF given patient's profound instability and falls Patient medically stable for discharge to skilled nursing facility.   Placing abdominal binder and TED hose today to minimize orthostatic drops.   Paroxysmal atrial fibrillation with RVR (HCC) Recently restarted on Eliquis May 1 in the setting of resolved thigh hematoma Continue Eliquis and amiodarone Follow clinically no signs or symptoms of bleeding at this time   Hypertensive urgency, resolved Primary hypertension Blood pressure elevated to 200/100 at intake, improved with resumption of home medications including lisinopril and metoprolol.  Improved control over interval.  Patient has been running hypotensive, likely due to sedating meds.  Monitor vitals and dose BP meds as permitted.   GERD without esophagitis PPI   Chronic diastolic CHF (congestive heart failure) (HCC) Baseline ischemic cardiomyopathy/ Chronic diastolic CHF 2D echo December 2023 with EF 50 to 55% Appears euvolemic Continue home regimen Marcelline Deist ok to hold while patient at SNF   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HCC) SSI, hypoglycemic protocol ongoing A1c 6.20 February 2023   Gout Cont allopurinol   Coronary artery disease Baseline CAD s/p CABG x 2  Minimally elevated troponin, chronic Continue core measures as above(metoprolol, lisinopril, statin)    Dyslipidemia Continue statin   CKD (chronic kidney disease) stage 2, GFR 60-89 ml/min Cr 0.9  At baseline    Chronic obstructive pulmonary disease (COPD) (HCC) Stable from respiratory standpoint Continue home inhalers     DVT prophylaxis: Apixaban Code Status: DNR Family Communication: None Disposition Plan: Status is: Inpatient Remains inpatient appropriate because: Unsafe discharge plan.  Need skilled nursing facility placement.  Medically stable   Level of care: Telemetry Medical  Consultants:  None  Procedures:  None  Antimicrobials: None   Subjective: Seen and examined.  Resting comfortably in bed.  No visible distress  Objective: Vitals:   04/04/23 0304 04/04/23 0500 04/04/23 0706 04/04/23 0905  BP: 130/64  115/73 108/64  Pulse: 64   68  Resp: 18   17  Temp: 98.1 F (36.7 C)   98.2 F (36.8 C)  TempSrc:    Oral  SpO2: 96%   97%  Weight:  84.3 kg    Height:        Intake/Output Summary (Last 24 hours) at 04/04/2023 1005 Last data filed at 04/04/2023 0653 Gross per 24 hour  Intake 480 ml  Output 2400 ml  Net -1920 ml   Filed Weights   04/01/23 0500 04/03/23 0500 04/04/23 0500  Weight: 84.5 kg 84.1 kg 84.3 kg    Examination:  General exam: No acute distress Respiratory system: Lungs clear.  Normal work of breathing.  Room air Cardiovascular system: S1-S2, RRR, no murmurs, no pedal edema Gastrointestinal system: Soft, NT/ND, normal bowel sounds Central nervous system: Alert and oriented. No focal neurological deficits. Extremities: Decreased power bilateral lower extremities.  Gait not assessed. Skin: No rashes, lesions or ulcers Psychiatry: Judgement and insight appear normal. Mood & affect appropriate.     Data Reviewed: I have personally reviewed following labs and imaging studies  CBC: Recent Labs  Lab 03/29/23 0500 04/01/23 1813  WBC 12.0* 10.9*  NEUTROABS  --  8.1*  HGB 15.7 15.6  HCT 50.7 51.8  MCV 88.3 89.8  PLT 244 183    Basic Metabolic Panel: Recent Labs  Lab 03/29/23 0617 04/01/23 1813  NA 137 134*  K 4.9 4.0  CL 94* 89*  CO2 34* 35*  GLUCOSE 156* 173*  BUN 31* 40*  CREATININE 1.21 1.27*  CALCIUM 8.6* 9.3   GFR: Estimated Creatinine Clearance: 57 mL/min (A) (by C-G formula based on SCr of 1.27 mg/dL (H)). Liver Function Tests: Recent Labs  Lab 03/29/23 0617  AST 22  ALT 20  ALKPHOS 49  BILITOT 1.3*  PROT 5.8*  ALBUMIN 3.2*   No results for input(s): "LIPASE", "AMYLASE" in the last 168 hours. Recent Labs  Lab 04/01/23 1813  AMMONIA 10   Coagulation Profile: No results for input(s): "INR", "PROTIME" in the last 168 hours. Cardiac Enzymes: No results for input(s): "CKTOTAL", "CKMB", "CKMBINDEX", "TROPONINI" in the last 168 hours. BNP (last 3 results) No results for input(s): "PROBNP" in the last 8760 hours. HbA1C: No results for input(s): "HGBA1C" in the last 72 hours. CBG: Recent Labs  Lab 04/02/23 1659 04/03/23 0518 04/03/23 0804 04/03/23 2108 04/04/23 0520  GLUCAP 162* 105* 120* 175* 120*   Lipid Profile: No results for input(s): "CHOL", "HDL", "LDLCALC", "TRIG", "CHOLHDL", "LDLDIRECT" in the last 72 hours. Thyroid Function Tests: No results for input(s): "TSH", "T4TOTAL", "FREET4", "T3FREE", "THYROIDAB" in the last 72 hours. Anemia Panel: No results for input(s): "VITAMINB12", "FOLATE", "FERRITIN", "TIBC", "IRON", "  RETICCTPCT" in the last 72 hours. Sepsis Labs: No results for input(s): "PROCALCITON", "LATICACIDVEN" in the last 168 hours.  No results found for this or any previous visit (from the past 240 hour(s)).       Radiology Studies: No results found.      Scheduled Meds:  allopurinol  300 mg Oral BID   ALPRAZolam  1 mg Oral TID   amiodarone  200 mg Oral Daily   apixaban  5 mg Oral BID   aspirin  81 mg Oral Daily   atorvastatin  80 mg Oral Daily   dapagliflozin propanediol  10 mg Oral Daily   isosorbide mononitrate  60 mg Oral Daily    lisinopril  20 mg Oral Daily   metoprolol succinate  100 mg Oral Daily   pantoprazole  40 mg Oral Daily   ranolazine  1,000 mg Oral BID   sodium chloride flush  3 mL Intravenous Q12H   torsemide  50 mg Oral Daily   venlafaxine XR  75 mg Oral Q breakfast   Continuous Infusions:   LOS: 4 days     Tresa Moore, MD [facility]   If 7PM-7AM, please contact night-coverage  04/04/2023, 10:05 AM

## 2023-04-05 DIAGNOSIS — I480 Paroxysmal atrial fibrillation: Secondary | ICD-10-CM | POA: Diagnosis not present

## 2023-04-05 DIAGNOSIS — M109 Gout, unspecified: Secondary | ICD-10-CM | POA: Diagnosis not present

## 2023-04-05 DIAGNOSIS — Z951 Presence of aortocoronary bypass graft: Secondary | ICD-10-CM | POA: Diagnosis not present

## 2023-04-05 DIAGNOSIS — R55 Syncope and collapse: Secondary | ICD-10-CM | POA: Diagnosis not present

## 2023-04-05 DIAGNOSIS — R531 Weakness: Secondary | ICD-10-CM | POA: Diagnosis not present

## 2023-04-05 DIAGNOSIS — E669 Obesity, unspecified: Secondary | ICD-10-CM | POA: Diagnosis not present

## 2023-04-05 DIAGNOSIS — Z79899 Other long term (current) drug therapy: Secondary | ICD-10-CM | POA: Diagnosis not present

## 2023-04-05 DIAGNOSIS — F418 Other specified anxiety disorders: Secondary | ICD-10-CM | POA: Diagnosis not present

## 2023-04-05 DIAGNOSIS — I5032 Chronic diastolic (congestive) heart failure: Secondary | ICD-10-CM | POA: Diagnosis not present

## 2023-04-05 DIAGNOSIS — I10 Essential (primary) hypertension: Secondary | ICD-10-CM | POA: Diagnosis not present

## 2023-04-05 DIAGNOSIS — G8921 Chronic pain due to trauma: Secondary | ICD-10-CM | POA: Diagnosis not present

## 2023-04-05 DIAGNOSIS — N182 Chronic kidney disease, stage 2 (mild): Secondary | ICD-10-CM | POA: Diagnosis not present

## 2023-04-05 DIAGNOSIS — E119 Type 2 diabetes mellitus without complications: Secondary | ICD-10-CM | POA: Diagnosis not present

## 2023-04-05 DIAGNOSIS — I25110 Atherosclerotic heart disease of native coronary artery with unstable angina pectoris: Secondary | ICD-10-CM | POA: Diagnosis not present

## 2023-04-05 DIAGNOSIS — E785 Hyperlipidemia, unspecified: Secondary | ICD-10-CM | POA: Diagnosis not present

## 2023-04-05 DIAGNOSIS — Z79891 Long term (current) use of opiate analgesic: Secondary | ICD-10-CM | POA: Diagnosis not present

## 2023-04-05 DIAGNOSIS — E782 Mixed hyperlipidemia: Secondary | ICD-10-CM | POA: Diagnosis not present

## 2023-04-05 DIAGNOSIS — Z7401 Bed confinement status: Secondary | ICD-10-CM | POA: Diagnosis not present

## 2023-04-05 DIAGNOSIS — J449 Chronic obstructive pulmonary disease, unspecified: Secondary | ICD-10-CM | POA: Diagnosis not present

## 2023-04-05 DIAGNOSIS — Z955 Presence of coronary angioplasty implant and graft: Secondary | ICD-10-CM | POA: Diagnosis not present

## 2023-04-05 DIAGNOSIS — I2510 Atherosclerotic heart disease of native coronary artery without angina pectoris: Secondary | ICD-10-CM | POA: Diagnosis not present

## 2023-04-05 DIAGNOSIS — G9009 Other idiopathic peripheral autonomic neuropathy: Secondary | ICD-10-CM | POA: Diagnosis not present

## 2023-04-05 DIAGNOSIS — I255 Ischemic cardiomyopathy: Secondary | ICD-10-CM | POA: Diagnosis not present

## 2023-04-05 DIAGNOSIS — K219 Gastro-esophageal reflux disease without esophagitis: Secondary | ICD-10-CM | POA: Diagnosis not present

## 2023-04-05 DIAGNOSIS — T7840XA Allergy, unspecified, initial encounter: Secondary | ICD-10-CM | POA: Diagnosis not present

## 2023-04-05 LAB — GLUCOSE, CAPILLARY: Glucose-Capillary: 120 mg/dL — ABNORMAL HIGH (ref 70–99)

## 2023-04-05 MED ORDER — ALPRAZOLAM 1 MG PO TABS
1.0000 mg | ORAL_TABLET | Freq: Three times a day (TID) | ORAL | 0 refills | Status: AC
Start: 2023-04-05 — End: 2023-04-08

## 2023-04-05 MED ORDER — ZOLPIDEM TARTRATE 5 MG PO TABS: 5.0000 mg | ORAL_TABLET | Freq: Every evening | ORAL | 0 refills | Status: DC | PRN

## 2023-04-05 MED ORDER — OXYCODONE-ACETAMINOPHEN 10-325 MG PO TABS
1.0000 | ORAL_TABLET | ORAL | 0 refills | Status: AC | PRN
Start: 2023-04-05 — End: 2023-04-08

## 2023-04-05 NOTE — Progress Notes (Signed)
Patient being discharged to [facility]. PIV removed. Called report to nurse Crystal at [facility]. All paper work in discharge packet, including DNR paper and prescriptions. Patient being transported via EMS.

## 2023-04-05 NOTE — Care Management Important Message (Signed)
Important Message  Patient Details  Name: Lawrence [name] MRN: [id number] Date of Birth: [birth year]   Medicare Important Message Given:  N/A - LOS <3 / Initial given by admissions     [name] A Nicoletta Hush 04/05/2023, 8:12 AM

## 2023-04-05 NOTE — Discharge Summary (Signed)
Physician Discharge Summary  Lawrence [name]:[id number] DOB: 06-Apr-1956 DOA: 03/28/2023  PCP: Malva Limes, MD  Admit date: 03/28/2023 Discharge date: 04/05/2023  Admitted From: Home Disposition:  SNF  Recommendations for Outpatient Follow-up:  Follow up with PCP in 1-2 weeks   Home Health:No Equipment/Devices:None   Discharge Condition:Stable  CODE STATUS:DNR  Diet recommendation: Carb mod  Brief/Interim Summary:  Lawrence [name] is a 44yoM with a PMH of CAD (h/o STEMI s/p CABG x2 LIMA-LAD and SVG-OM1 2002 c/b coronary artery dissection with multiple subsequent PCI and stents), chronic stable angina, HFpEF (50-55%, mild LVH 10/2022), permanent atrial fibrillation s/p unsuccessful DCCV 06/29/2022, COPD (PRN 2L), hx tobacco use, DM2 who presented to [facility] ED 03/28/2023 with syncope and chest pain. Patient was complaining of left-sided chest pain, increased with exertion and reports that morphine is the only thing that helps his chest pain. He was also complaining of multiple episodes of syncope over the last month.  Endorses prodromal symptoms with dizziness, nausea. Patient has multiple ED visits, 11 in March and 8 in April.  Multiple falls.  Most recent admission on 4/7 till 4/8 with chest pain.   5/13-5/14: MRI negative for any acute findings, vitals improving.  Clinically patient remains profoundly weak, unable to ambulate without max assist, high fall risk.   5/15: Patient remains orthostatic.  Pending bed offers for skilled nursing facility placement.   5/17: Patient remains medically stable for discharge.  Notified by [facility] that skilled nursing facility was citing increased cost of 3 medications that the patient was on.  Medications sided were Linzess, Marcelline Deist, Belsomra.  The patient does not take Linzess.  Belsomra can be substituted for another agent.  Marcelline Deist is goal-directed medical therapy for heart failure.   5/18: SNF will take patient Monday 5/20 5/20: Patient stable for  discharge.  Marcelline Deist has been discontinued.  Linzess has been discontinued.  Belsomra substituted for Ambien.  Ranexa to be continued.   Discharge Diagnoses:  Principal Problem:   Syncope Active Problems:   Chest pain   Primary hypertension   Paroxysmal atrial fibrillation with RVR (HCC)   Hypertensive urgency   Chronic diastolic CHF (congestive heart failure) (HCC)   GERD without esophagitis   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HCC)   Chronic obstructive pulmonary disease (COPD) (HCC)   CKD (chronic kidney disease) stage 2, GFR 60-89 ml/min   Dyslipidemia   Coronary artery disease   Gout  Syncope, recurrent Acute ambulatory dysfunction Recurring issue with prior admissions for weakness, dizziness and falls in the setting of extensive cardiac history including systolic heart failure, atrial fibrillation, coronary artery disease, diastolic CHF, hypertension 2D ECHO 10/2022 w/ EF 50-55% and normal valvular function  Orthostatics unremarkable Cardiology was also consulted-no further inpatient workup warranted PT is recommending SNF given patient's profound instability and falls Patient medically stable for discharge to skilled nursing facility.   Placing abdominal binder and TED hose to minimize orthostatic drops.   Paroxysmal atrial fibrillation with RVR (HCC) Recently restarted on Eliquis May 1 in the setting of resolved thigh hematoma Continue Eliquis and amiodarone Follow clinically no signs or symptoms of bleeding at this time   Hypertensive urgency, resolved Primary hypertension Blood pressure elevated to 200/100 at intake, improved with resumption of home medications including lisinopril and metoprolol.  Improved control over interval.  Patient has been running hypotensive, likely due to sedating meds.  Monitor vitals and dose BP meds as permitted.   GERD without esophagitis PPI   Chronic diastolic  CHF (congestive heart failure) (HCC) Baseline ischemic  cardiomyopathy/ Chronic diastolic CHF 2D echo December 2023 with EF 50 to 55% Appears euvolemic Continue home regimen Marcelline Deist ok to hold while patient at SNF   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HCC) SSI, hypoglycemic protocol ongoing A1c 6.20 February 2023 Can resume p.o. diabetic regimen at time of discharge   Gout Cont allopurinol   Coronary artery disease Baseline CAD s/p CABG x 2  Minimally elevated troponin, chronic Continue core measures as above(metoprolol, lisinopril, statin)   Dyslipidemia Continue statin   CKD (chronic kidney disease) stage 2, GFR 60-89 ml/min Cr 0.9  At baseline    Chronic obstructive pulmonary disease (COPD) (HCC) Stable from respiratory standpoint Continue home inhalers  Discharge Instructions   Allergies as of 04/05/2023       Reactions   Prednisone Other (See Comments), Hypertension   Pt states that this medication puts him in A-fib    Demerol  [meperidine Hcl]    Demerol [meperidine] Hives   Jardiance [empagliflozin] Other (See Comments)   Perineal pain   Sulfa Antibiotics Hives   Albuterol Sulfate [albuterol] Palpitations, Other (See Comments)   Pt currently uses this medication.     Morphine Sulfate Nausea And Vomiting, Rash, Other (See Comments)   Pt states that he is only allergic to the tablet form of this medication.          Medication List     STOP taking these medications    Belsomra 10 MG Tabs Generic drug: Suvorexant   Farxiga 10 MG Tabs tablet Generic drug: dapagliflozin propanediol   linaclotide 72 MCG capsule Commonly known as: LINZESS   metroNIDAZOLE 1 % gel Commonly known as: METROGEL   predniSONE 10 MG tablet Commonly known as: DELTASONE       TAKE these medications    Accu-Chek Guide Control Liqd Use with blood glucose monitor as directed   Accu-Chek Guide test strip Generic drug: glucose blood Use as instructed to check sugar daily for type 2 diabetes.   Accu-Chek Guide  w/Device Kit Use to check blood sugars as directed   Accu-Chek Softclix Lancets lancets Use as instructed to check sugar daily for type 2 diabetes.   acetaminophen 325 MG tablet Commonly known as: TYLENOL Take 2 tablets (650 mg total) by mouth every 4 (four) hours as needed for headache or mild pain.   albuterol 108 (90 Base) MCG/ACT inhaler Commonly known as: VENTOLIN HFA INHALE 2 PUFFS BY MOUTH EVERY 6 HOURS AS NEEDED FOR SHORTNESS OF BREATH   allopurinol 300 MG tablet Commonly known as: ZYLOPRIM TAKE 1 TABLET BY MOUTH TWICE A DAY   ALPRAZolam 1 MG tablet Commonly known as: XANAX Take 1 tablet (1 mg total) by mouth 3 (three) times daily for 3 days. SNF use only.  Refills per SNF MD What changed: additional instructions   amiodarone 200 MG tablet Commonly known as: PACERONE Take 200 mg by mouth 2 (two) times daily.   aspirin 81 MG chewable tablet Chew 81 mg by mouth daily. Swallows whole   atorvastatin 80 MG tablet Commonly known as: LIPITOR Take 1 tablet (80 mg total) by mouth daily. Please schedule office visit before any future refill.   Breztri Aerosphere 160-9-4.8 MCG/ACT Aero Generic drug: Budeson-Glycopyrrol-Formoterol Inhale 2 puffs into the lungs in the morning and at bedtime.   celecoxib 200 MG capsule Commonly known as: CELEBREX Take 1 capsule (200 mg total) by mouth 2 (two) times daily as needed. Home med.   cetirizine  10 MG tablet Commonly known as: ZYRTEC TAKE 1 TABLET BY MOUTH AT BEDTIME   Eliquis 5 MG Tabs tablet Generic drug: apixaban TAKE 1 TABLET BY MOUTH TWICE A DAY What changed: how much to take   glipiZIDE 5 MG tablet Commonly known as: GLUCOTROL Take 1 tablet (5 mg total) by mouth daily as needed (sugar over 300).   isosorbide mononitrate 60 MG 24 hr tablet Commonly known as: IMDUR Take 1-2 tablets (60-120 mg total) by mouth daily. Home med.   lisinopril 20 MG tablet Commonly known as: ZESTRIL Take 20 mg by mouth daily.    meclizine 25 MG tablet Commonly known as: ANTIVERT TAKE ONE TABLET BY THREE TIMES A DAY AS NEEDED FOR DIZZINESS   metFORMIN 500 MG 24 hr tablet Commonly known as: GLUCOPHAGE-XR TAKE 1 TABLET BY MOUTH TWICE A DAY What changed: when to take this   methocarbamol 500 MG tablet Commonly known as: ROBAXIN Take 500 mg by mouth every 8 (eight) hours as needed for muscle spasms. Take 1 tablet by mouth every hour as needed for muscle spasms   metoprolol succinate 100 MG 24 hr tablet Commonly known as: TOPROL-XL Take 100 mg by mouth daily.   naloxone 4 MG/0.1ML Liqd nasal spray kit Commonly known as: NARCAN 1 spray into nostril x1 and may repeat every 2-3 minutes until patient is responsive or EMS arrives   nitroGLYCERIN 0.4 MG SL tablet Commonly known as: NITROSTAT Place 1 tablet (0.4 mg total) under the tongue every 5 (five) minutes as needed for chest pain.   omega-3 acid ethyl esters 1 g capsule Commonly known as: LOVAZA Take 4 capsules (4 g total) by mouth daily.   omeprazole 40 MG capsule Commonly known as: PRILOSEC TAKE 1 CAPSULE BY MOUTH 2 TIMES DAILY 30 MINUTES BEFORE BREAKFAST AND DINNER What changed: See the new instructions.   oxyCODONE-acetaminophen 10-325 MG tablet Commonly known as: PERCOCET Take 1 tablet by mouth every 4 (four) hours as needed for up to 3 days for pain. SNF use only.  Refills per SNF MD What changed:  reasons to take this additional instructions   ranolazine 1000 MG SR tablet Commonly known as: RANEXA Take 1 tablet (1,000 mg total) by mouth 2 (two) times daily.   torsemide 100 MG tablet Commonly known as: DEMADEX Take 0.5 tablets (50 mg total) by mouth daily.   traZODone 150 MG tablet Commonly known as: DESYREL TAKE 1 TABLET BY MOUTH AT BEDTIME   venlafaxine XR 75 MG 24 hr capsule Commonly known as: EFFEXOR-XR TAKE 1 CAPSULE BY MOUTH DAILY WITH BREAKFAST   zolpidem 5 MG tablet Commonly known as: Ambien Take 1 tablet (5 mg total) by  mouth at bedtime as needed for up to 3 days for sleep. SNF use only.  Refills per SNF MD        Contact information for follow-up providers     Dalia Heading, MD. Schedule an appointment as soon as possible for a visit in 1 week(s).   Specialty: Cardiology        Malva Limes, MD .   Specialty: [facility] Medicine Contact information: [address] [place] [address] [phone]              Contact information for after-discharge care     Destination     [facility] [place] [facility] .   Service: Skilled Paramedic information: [address] [place] [address] [phone]  Allergies  Allergen Reactions   Prednisone Other (See Comments) and Hypertension    Pt states that this medication puts him in A-fib    Demerol  [Meperidine Hcl]    Demerol [Meperidine] Hives   Jardiance [Empagliflozin] Other (See Comments)    Perineal pain   Sulfa Antibiotics Hives   Albuterol Sulfate [Albuterol] Palpitations and Other (See Comments)    Pt currently uses this medication.     Morphine Sulfate Nausea And Vomiting, Rash and Other (See Comments)    Pt states that he is only allergic to the tablet form of this medication.      Consultations: None   Procedures/Studies: MR BRAIN WO CONTRAST  Result Date: 03/28/2023 CLINICAL DATA:  Loss of consciousness. EXAM: MRI HEAD WITHOUT CONTRAST TECHNIQUE: Multiplanar, multiecho pulse sequences of the brain and surrounding structures were obtained without intravenous contrast. COMPARISON:  Head CT from yesterday FINDINGS: Brain: No acute infarction, hemorrhage, hydrocephalus, extra-axial collection or mass lesion. Mild chronic small vessel ischemia in the deep cerebral white matter. Vascular: Normal flow voids Skull and upper cervical spine: Normal marrow signal Sinuses/Orbits: Retention cyst in the left nasopharynx. No acute finding. IMPRESSION: No  acute or reversible finding. Electronically Signed   By: Tiburcio Pea M.D.   On: 03/28/2023 11:04   CT Angio Chest PE W/Cm &/Or Wo Cm  Result Date: 03/28/2023 CLINICAL DATA:  Pulmonary embolism suspected EXAM: CT ANGIOGRAPHY CHEST WITH CONTRAST TECHNIQUE: Multidetector CT imaging of the chest was performed using the standard protocol during bolus administration of intravenous contrast. Multiplanar CT image reconstructions and MIPs were obtained to evaluate the vascular anatomy. RADIATION DOSE REDUCTION: This exam was performed according to the departmental dose-optimization program which includes automated exposure control, adjustment of the mA and/or kV according to patient size and/or use of iterative reconstruction technique. CONTRAST:  75mL OMNIPAQUE IOHEXOL 350 MG/ML SOLN COMPARISON:  01/27/2023 FINDINGS: Cardiovascular: Satisfactory opacification of the pulmonary arteries to the segmental level. No evidence of pulmonary embolism. Normal heart size. No pericardial effusion. Atherosclerosis including the coronary arteries. CABG. Due to timing there is no systemic arterial enhancement. Mediastinum/Nodes: Negative for mass or adenopathy Lungs/Pleura: There is no edema, consolidation, effusion, or pneumothorax. 6 mm average diameter pulmonary nodule in the right middle lobe on 6:77, stable from scan 2 months ago but larger than on a 2021 comparison. Upper Abdomen: Cholecystectomy. 16 mm nodule in the left adrenal body that is stable from 2022 and consistent with adenoma. Musculoskeletal: Generalized spondylitic spurring. No acute finding. Review of the MIP images confirms the above findings. IMPRESSION: 1. Negative for pulmonary embolism or other acute finding. 2. 6 mm pulmonary nodule in the right middle lobe. Non-contrast chest CT at 6-12 months is recommended. If the nodule is stable at time of repeat CT, then future CT at 18-24 months (from today's scan) is considered optional for low-risk patients, but  is recommended for high-risk patients. This recommendation follows the consensus statement: Guidelines for Management of Incidental Pulmonary Nodules Detected on CT Images: From the Fleischner Society 2017; Radiology 2017; 284:228-243. 3. Atherosclerosis including the coronary arteries. Electronically Signed   By: Tiburcio Pea M.D.   On: 03/28/2023 04:14   CT HEAD WO CONTRAST  Result Date: 03/27/2023 CLINICAL DATA:  Syncopal episode EXAM: CT HEAD WITHOUT CONTRAST TECHNIQUE: Contiguous axial images were obtained from the base of the skull through the vertex without intravenous contrast. RADIATION DOSE REDUCTION: This exam was performed according to the departmental dose-optimization program which includes automated exposure control, adjustment  of the mA and/or kV according to patient size and/or use of iterative reconstruction technique. COMPARISON:  02/26/2023 FINDINGS: Brain: No evidence of acute infarction, hemorrhage, hydrocephalus, extra-axial collection or mass lesion/mass effect. Mild atrophic and ischemic changes are again seen and stable. Vascular: No hyperdense vessel or unexpected calcification. Skull: Normal. Negative for fracture or focal lesion. Sinuses/Orbits: No acute finding. Other: None. IMPRESSION: Chronic atrophic and ischemic changes without acute abnormality. Electronically Signed   By: Alcide Clever M.D.   On: 03/27/2023 21:23   DG Lumbar Spine Complete  Addendum Date: 03/13/2023   ADDENDUM REPORT: 03/13/2023 05:39 ADDENDUM: The inferior endplate in question is L4, not L1. The L1 vertebral body is normal in appearance. Electronically Signed   By: Marin Roberts M.D.   On: 03/13/2023 05:39   Result Date: 03/13/2023 CLINICAL DATA:  Fall.  Low back pain. EXAM: LUMBAR SPINE - COMPLETE 4+ VIEW COMPARISON:  Lumbar spine radiographs 02/26/2023, 02/04/2023, 01/18/2023 and 12/26/2022. FINDINGS: 5 non rib-bearing lumbar type vertebral bodies are present. Grade 1 anterolisthesis at L4-5  is stable. No other significant listhesis is present. Subtle inferior endplate fracture is stable since the prior exam but appears to be new since exams earlier in March in February. Vertebral body heights are otherwise maintained. Atherosclerotic calcifications are present in the aorta without aneurysm. IMPRESSION: 1. Subtle inferior endplate fracture at L1 appears to be new since March in February. Recommend MRI of the lumbar spine without contrast for further evaluation. If patient cannot tolerate MRI, CT could be used. 2. No acute fracture or significant change since 02/26/2023. 3. Stable grade 1 anterolisthesis at L4-5. 4. Aortic atherosclerosis. Electronically Signed: By: Marin Roberts M.D. On: 03/13/2023 04:55   CT Lumbar Spine Wo Contrast  Result Date: 03/13/2023 CLINICAL DATA:  Abnormal x-ray.  Possible L4 compression fracture. EXAM: CT LUMBAR SPINE WITHOUT CONTRAST TECHNIQUE: Multidetector CT imaging of the lumbar spine was performed without intravenous contrast administration. Multiplanar CT image reconstructions were also generated. RADIATION DOSE REDUCTION: This exam was performed according to the departmental dose-optimization program which includes automated exposure control, adjustment of the mA and/or kV according to patient size and/or use of iterative reconstruction technique. COMPARISON:  Lumbar spine radiographs 03/13/2023. CT of the abdomen and pelvis 05/26/2018 FINDINGS: Segmentation: 5 non rib-bearing lumbar type vertebral bodies are present. The lowest fully formed vertebral body is L5. Alignment: Slight anterolisthesis present at L4-5. No other significant listhesis is present. Lumbar lordosis is preserved. Vertebrae: Inferior endplate changes at L4 are chronic, not significantly changed from the CT of 2019. The appearance on lumbar radiographs 03/13/2023 and 02/26/2023 is likely due to obliquity. A remote inferior endplate fracture at T12 is stable. No acute fractures are  present. Chronic endplate sclerotic changes are noted posteriorly at L5-S1. Paraspinal and other soft tissues: Atherosclerotic calcifications are present in the aorta and branch vessels without aneurysm. No solid organ lesions are present. No significant adenopathy is present. Disc levels: L1-2: Mild facet hypertrophy is present bilaterally. No significant disc protrusion or stenosis is present. L2-3: Mild bilateral facet hypertrophy is present. No focal disc protrusion or stenosis is present. L3-4: Mild facet hypertrophy is present bilaterally. A mild broad-based disc protrusion is present. No significant stenosis is present. L4-5: Moderate facet hypertrophy present bilaterally. Gas is present in the facet joints. A broad-based disc protrusion is present. This results in moderate central canal stenosis. Mild foraminal narrowing bilaterally is worse on the right. L5-S1: Mild facet hypertrophy is present bilaterally. Mild disc bulging is present  without significant focal stenosis. IMPRESSION: 1. Inferior endplate changes at L4 are chronic, not significantly changed from the CT of 2019. The appearance on lumbar radiographs 03/13/2023 and 02/26/2023 is likely due to obliquity. 2. Remote inferior endplate fracture at T12 is stable. 3. No acute or healing fractures. 4. Moderate central canal stenosis and mild foraminal narrowing bilaterally at L4-5 is worse on the right. 5. Mild facet hypertrophy at L3-4 and L5-S1 without significant focal disc protrusion or stenosis. 6.  Aortic Atherosclerosis (ICD10-I70.0). Electronically Signed   By: Marin Roberts M.D.   On: 03/13/2023 05:38   DG Chest 2 View  Result Date: 03/12/2023 CLINICAL DATA:  Chest pain EXAM: CHEST - 2 VIEW COMPARISON:  Chest x-ray 02/21/2023 FINDINGS: Patient is status post cardiac surgery. The heart is enlarged. There central pulmonary vascular congestion. There is no lung consolidation, pleural effusion or pneumothorax. No acute fractures are  seen. IMPRESSION: Cardiomegaly with central pulmonary vascular congestion. Electronically Signed   By: Darliss Cheney M.D.   On: 03/12/2023 23:19      Subjective: Seen and examined on the day of discharge.  Stable no distress.  Appropriate for discharge to skilled nursing facility.  Discharge Exam: Vitals:   04/04/23 2009 04/05/23 0441  BP: 119/79 123/66  Pulse: 68 60  Resp: 18 16  Temp: 98.2 F (36.8 C) 97.7 F (36.5 C)  SpO2: 90% 95%   Vitals:   04/04/23 0905 04/04/23 2009 04/05/23 0209 04/05/23 0441  BP: 108/64 119/79  123/66  Pulse: 68 68  60  Resp: 17 18  16   Temp: 98.2 F (36.8 C) 98.2 F (36.8 C)  97.7 F (36.5 C)  TempSrc: Oral Oral    SpO2: 97% 90%  95%  Weight:   82.5 kg   Height:        General: Pt is alert, awake, not in acute distress Cardiovascular: RRR, S1/S2 +, no rubs, no gallops Respiratory: CTA bilaterally, no wheezing, no rhonchi Abdominal: Soft, NT, ND, bowel sounds + Extremities: no edema, no cyanosis    The results of significant diagnostics from this hospitalization (including imaging, microbiology, ancillary and laboratory) are listed below for reference.     Microbiology: No results found for this or any previous visit (from the past 240 hour(s)).   Labs: BNP (last 3 results) Recent Labs    12/28/22 0120 01/31/23 0143 02/06/23 2045  BNP 215.0* 100.5* 152.6*   Basic Metabolic Panel: Recent Labs  Lab 04/01/23 1813  NA 134*  K 4.0  CL 89*  CO2 35*  GLUCOSE 173*  BUN 40*  CREATININE 1.27*  CALCIUM 9.3   Liver Function Tests: No results for input(s): "AST", "ALT", "ALKPHOS", "BILITOT", "PROT", "ALBUMIN" in the last 168 hours. No results for input(s): "LIPASE", "AMYLASE" in the last 168 hours. Recent Labs  Lab 04/01/23 1813  AMMONIA 10   CBC: Recent Labs  Lab 04/01/23 1813  WBC 10.9*  NEUTROABS 8.1*  HGB 15.6  HCT 51.8  MCV 89.8  PLT 183   Cardiac Enzymes: No results for input(s): "CKTOTAL", "CKMB",  "CKMBINDEX", "TROPONINI" in the last 168 hours. BNP: Invalid input(s): "POCBNP" CBG: Recent Labs  Lab 04/03/23 0804 04/03/23 2108 04/04/23 0520 04/04/23 2013 04/05/23 0450  GLUCAP 120* 175* 120* 168* 120*   D-Dimer No results for input(s): "DDIMER" in the last 72 hours. Hgb A1c No results for input(s): "HGBA1C" in the last 72 hours. Lipid Profile No results for input(s): "CHOL", "HDL", "LDLCALC", "TRIG", "CHOLHDL", "LDLDIRECT" in the last 72  hours. Thyroid function studies No results for input(s): "TSH", "T4TOTAL", "T3FREE", "THYROIDAB" in the last 72 hours.  Invalid input(s): "FREET3" Anemia work up No results for input(s): "VITAMINB12", "FOLATE", "FERRITIN", "TIBC", "IRON", "RETICCTPCT" in the last 72 hours. Urinalysis    Component Value Date/Time   COLORURINE YELLOW (A) 04/01/2023 1625   APPEARANCEUR CLEAR (A) 04/01/2023 1625   APPEARANCEUR Clear 05/25/2019 1554   LABSPEC 1.022 04/01/2023 1625   LABSPEC 1.012 03/07/2015 2143   PHURINE 5.0 04/01/2023 1625   GLUCOSEU >=500 (A) 04/01/2023 1625   GLUCOSEU Negative 03/07/2015 2143   HGBUR NEGATIVE 04/01/2023 1625   BILIRUBINUR NEGATIVE 04/01/2023 1625   BILIRUBINUR Negative 05/25/2019 1554   BILIRUBINUR Negative 03/07/2015 2143   KETONESUR NEGATIVE 04/01/2023 1625   PROTEINUR NEGATIVE 04/01/2023 1625   UROBILINOGEN 0.2 03/26/2017 0907   NITRITE NEGATIVE 04/01/2023 1625   LEUKOCYTESUR NEGATIVE 04/01/2023 1625   LEUKOCYTESUR Negative 03/07/2015 2143   Sepsis Labs Recent Labs  Lab 04/01/23 1813  WBC 10.9*   Microbiology No results found for this or any previous visit (from the past 240 hour(s)).   Time coordinating discharge: Over 30 minutes  SIGNED:   Tresa Moore, MD  [facility] 04/05/2023, 8:27 AM Pager   If 7PM-7AM, please contact night-coverage

## 2023-04-05 NOTE — TOC Progression Note (Signed)
Transition of Care [facility]) - Progression Note    Patient Details  Name: Lawrence [name] MRN: [id number] Date of Birth: 1956-03-24  Transition of Care [facility]) CM/SW Contact  Allena Katz, LCSW Phone Number: 04/05/2023, 9:17 AM  Clinical Narrative:   Berkley Harvey started today for [facility].     Expected Discharge Plan: Skilled Nursing Facility Barriers to Discharge: Continued Medical Work up  Expected Discharge Plan and Services       Living arrangements for the past 2 months: Single Family Home                                       Social Determinants of Health (SDOH) Interventions SDOH Screenings   Food Insecurity: No Food Insecurity (03/30/2023)  Housing: Low Risk  (03/30/2023)  Transportation Needs: No Transportation Needs (03/30/2023)  Utilities: Not At Risk (03/30/2023)  Alcohol Screen: Low Risk  (03/10/2023)  Depression (PHQ2-9): Low Risk  (03/10/2023)  Financial Resource Strain: Low Risk  (03/10/2023)  Physical Activity: Inactive (03/10/2023)  Social Connections: Moderately Isolated (03/10/2023)  Stress: No Stress Concern Present (03/10/2023)  Tobacco Use: Medium Risk (03/30/2023)    Readmission Risk Interventions     No data to display

## 2023-04-05 NOTE — TOC Transition Note (Signed)
Transition of Care [facility]) - CM/SW Discharge Note   Patient Details  Name: Lawrence [name] MRN: [id number] Date of Birth: 08-Mar-1956  Transition of Care [facility]) CM/SW Contact:  Allena Katz, LCSW Phone Number: 04/05/2023, 11:28 AM   Clinical Narrative:   Pt has Auth to discharge to [facility]. . RN given number for report. DC summary sent to [facility]. Gavin Pound with [facility] notified. Medical Necessity printed to unit. DNR on the chart.     Final next level of care: Skilled Nursing Facility Barriers to Discharge: Barriers Resolved   Patient Goals and CMS Choice CMS Medicare.gov Compare Post Acute Care list provided to:: Patient Choice offered to / list presented to : Patient  Discharge Placement                Patient chooses bed at: [facility] Patient to be transferred to facility by: [facility] Additional resources added to the After Visit Summary for                                       Social Determinants of Health (SDOH) Interventions SDOH Screenings   Food Insecurity: No Food Insecurity (03/30/2023)  Housing: Low Risk  (03/30/2023)  Transportation Needs: No Transportation Needs (03/30/2023)  Utilities: Not At Risk (03/30/2023)  Alcohol Screen: Low Risk  (03/10/2023)  Depression (PHQ2-9): Low Risk  (03/10/2023)  Financial Resource Strain: Low Risk  (03/10/2023)  Physical Activity: Inactive (03/10/2023)  Social Connections: Moderately Isolated (03/10/2023)  Stress: No Stress Concern Present (03/10/2023)  Tobacco Use: Medium Risk (03/30/2023)     Readmission Risk Interventions     No data to display

## 2023-04-06 ENCOUNTER — Ambulatory Visit: Payer: Medicare HMO | Admitting: Pulmonary Disease

## 2023-04-06 DIAGNOSIS — I5032 Chronic diastolic (congestive) heart failure: Secondary | ICD-10-CM | POA: Diagnosis not present

## 2023-04-06 DIAGNOSIS — R55 Syncope and collapse: Secondary | ICD-10-CM | POA: Diagnosis not present

## 2023-04-06 DIAGNOSIS — I10 Essential (primary) hypertension: Secondary | ICD-10-CM | POA: Diagnosis not present

## 2023-04-06 DIAGNOSIS — G8921 Chronic pain due to trauma: Secondary | ICD-10-CM | POA: Diagnosis not present

## 2023-04-06 DIAGNOSIS — E119 Type 2 diabetes mellitus without complications: Secondary | ICD-10-CM | POA: Diagnosis not present

## 2023-04-06 DIAGNOSIS — M109 Gout, unspecified: Secondary | ICD-10-CM | POA: Diagnosis not present

## 2023-04-06 DIAGNOSIS — I480 Paroxysmal atrial fibrillation: Secondary | ICD-10-CM | POA: Diagnosis not present

## 2023-04-06 DIAGNOSIS — N182 Chronic kidney disease, stage 2 (mild): Secondary | ICD-10-CM | POA: Diagnosis not present

## 2023-04-07 DIAGNOSIS — I10 Essential (primary) hypertension: Secondary | ICD-10-CM | POA: Diagnosis not present

## 2023-04-07 DIAGNOSIS — M109 Gout, unspecified: Secondary | ICD-10-CM | POA: Diagnosis not present

## 2023-04-07 DIAGNOSIS — I5032 Chronic diastolic (congestive) heart failure: Secondary | ICD-10-CM | POA: Diagnosis not present

## 2023-04-07 DIAGNOSIS — I480 Paroxysmal atrial fibrillation: Secondary | ICD-10-CM | POA: Diagnosis not present

## 2023-04-07 DIAGNOSIS — R55 Syncope and collapse: Secondary | ICD-10-CM | POA: Diagnosis not present

## 2023-04-07 DIAGNOSIS — I2510 Atherosclerotic heart disease of native coronary artery without angina pectoris: Secondary | ICD-10-CM | POA: Diagnosis not present

## 2023-04-07 DIAGNOSIS — K219 Gastro-esophageal reflux disease without esophagitis: Secondary | ICD-10-CM | POA: Diagnosis not present

## 2023-04-07 DIAGNOSIS — E119 Type 2 diabetes mellitus without complications: Secondary | ICD-10-CM | POA: Diagnosis not present

## 2023-04-08 DIAGNOSIS — G8921 Chronic pain due to trauma: Secondary | ICD-10-CM | POA: Diagnosis not present

## 2023-04-08 DIAGNOSIS — Z79891 Long term (current) use of opiate analgesic: Secondary | ICD-10-CM | POA: Diagnosis not present

## 2023-04-08 DIAGNOSIS — Z79899 Other long term (current) drug therapy: Secondary | ICD-10-CM | POA: Diagnosis not present

## 2023-04-08 DIAGNOSIS — F418 Other specified anxiety disorders: Secondary | ICD-10-CM | POA: Diagnosis not present

## 2023-04-09 ENCOUNTER — Encounter: Payer: Self-pay | Admitting: Emergency Medicine

## 2023-04-12 DIAGNOSIS — Z79891 Long term (current) use of opiate analgesic: Secondary | ICD-10-CM | POA: Diagnosis not present

## 2023-04-12 DIAGNOSIS — G8921 Chronic pain due to trauma: Secondary | ICD-10-CM | POA: Diagnosis not present

## 2023-04-12 DIAGNOSIS — F418 Other specified anxiety disorders: Secondary | ICD-10-CM | POA: Diagnosis not present

## 2023-04-12 DIAGNOSIS — Z79899 Other long term (current) drug therapy: Secondary | ICD-10-CM | POA: Diagnosis not present

## 2023-04-16 DIAGNOSIS — Z955 Presence of coronary angioplasty implant and graft: Secondary | ICD-10-CM | POA: Diagnosis not present

## 2023-04-16 DIAGNOSIS — E782 Mixed hyperlipidemia: Secondary | ICD-10-CM | POA: Diagnosis not present

## 2023-04-16 DIAGNOSIS — I25110 Atherosclerotic heart disease of native coronary artery with unstable angina pectoris: Secondary | ICD-10-CM | POA: Diagnosis not present

## 2023-04-16 DIAGNOSIS — E669 Obesity, unspecified: Secondary | ICD-10-CM | POA: Diagnosis not present

## 2023-04-16 DIAGNOSIS — Z951 Presence of aortocoronary bypass graft: Secondary | ICD-10-CM | POA: Diagnosis not present

## 2023-04-16 DIAGNOSIS — I255 Ischemic cardiomyopathy: Secondary | ICD-10-CM | POA: Diagnosis not present

## 2023-04-16 DIAGNOSIS — I480 Paroxysmal atrial fibrillation: Secondary | ICD-10-CM | POA: Diagnosis not present

## 2023-04-16 DIAGNOSIS — I5032 Chronic diastolic (congestive) heart failure: Secondary | ICD-10-CM | POA: Diagnosis not present

## 2023-04-16 DIAGNOSIS — I10 Essential (primary) hypertension: Secondary | ICD-10-CM | POA: Diagnosis not present

## 2023-04-19 ENCOUNTER — Other Ambulatory Visit: Payer: Self-pay | Admitting: Family Medicine

## 2023-04-19 DIAGNOSIS — E785 Hyperlipidemia, unspecified: Secondary | ICD-10-CM | POA: Diagnosis not present

## 2023-04-19 DIAGNOSIS — I480 Paroxysmal atrial fibrillation: Secondary | ICD-10-CM | POA: Diagnosis not present

## 2023-04-19 DIAGNOSIS — F418 Other specified anxiety disorders: Secondary | ICD-10-CM | POA: Diagnosis not present

## 2023-04-19 DIAGNOSIS — J449 Chronic obstructive pulmonary disease, unspecified: Secondary | ICD-10-CM | POA: Diagnosis not present

## 2023-04-19 DIAGNOSIS — M109 Gout, unspecified: Secondary | ICD-10-CM | POA: Diagnosis not present

## 2023-04-19 DIAGNOSIS — G8921 Chronic pain due to trauma: Secondary | ICD-10-CM | POA: Diagnosis not present

## 2023-04-19 DIAGNOSIS — I2510 Atherosclerotic heart disease of native coronary artery without angina pectoris: Secondary | ICD-10-CM | POA: Diagnosis not present

## 2023-04-19 DIAGNOSIS — T7840XA Allergy, unspecified, initial encounter: Secondary | ICD-10-CM | POA: Diagnosis not present

## 2023-04-20 ENCOUNTER — Other Ambulatory Visit (INDEPENDENT_AMBULATORY_CARE_PROVIDER_SITE_OTHER): Payer: Self-pay | Admitting: Nurse Practitioner

## 2023-04-20 DIAGNOSIS — I739 Peripheral vascular disease, unspecified: Secondary | ICD-10-CM

## 2023-04-22 ENCOUNTER — Encounter (INDEPENDENT_AMBULATORY_CARE_PROVIDER_SITE_OTHER): Payer: Medicare HMO | Admitting: Nurse Practitioner

## 2023-04-22 ENCOUNTER — Telehealth: Payer: Self-pay

## 2023-04-22 ENCOUNTER — Encounter (INDEPENDENT_AMBULATORY_CARE_PROVIDER_SITE_OTHER): Payer: Medicare HMO

## 2023-04-22 DIAGNOSIS — I5032 Chronic diastolic (congestive) heart failure: Secondary | ICD-10-CM

## 2023-04-22 DIAGNOSIS — I5023 Acute on chronic systolic (congestive) heart failure: Secondary | ICD-10-CM

## 2023-04-22 DIAGNOSIS — J9611 Chronic respiratory failure with hypoxia: Secondary | ICD-10-CM

## 2023-04-22 DIAGNOSIS — N182 Chronic kidney disease, stage 2 (mild): Secondary | ICD-10-CM

## 2023-04-22 DIAGNOSIS — I480 Paroxysmal atrial fibrillation: Secondary | ICD-10-CM

## 2023-04-22 DIAGNOSIS — E1121 Type 2 diabetes mellitus with diabetic nephropathy: Secondary | ICD-10-CM

## 2023-04-22 NOTE — Telephone Encounter (Signed)
Copied from CRM [id number]. Topic: General - Other >> Apr 22, 2023 10:28 AM Turkey B wrote: Reason for CRM: [place] Nurse called in requesting if pt has home health ordered for nurse at home since he has congestive heart failure and pt is also requesting a bed side commode and toilet seat lift. Please call back pt with updates to this.

## 2023-04-23 NOTE — Addendum Note (Signed)
Addended by: Malva Limes on: 04/23/2023 08:08 AM   Modules accepted: Orders

## 2023-04-27 ENCOUNTER — Emergency Department: Payer: Medicare HMO

## 2023-04-27 ENCOUNTER — Encounter: Payer: Self-pay | Admitting: Internal Medicine

## 2023-04-27 ENCOUNTER — Inpatient Hospital Stay
Admission: EM | Admit: 2023-04-27 | Discharge: 2023-05-04 | DRG: 280 | Disposition: A | Discharge status: Home / Self Care | Payer: Medicare HMO | Attending: Internal Medicine | Admitting: Internal Medicine

## 2023-04-27 DIAGNOSIS — M5136 Other intervertebral disc degeneration, lumbar region: Secondary | ICD-10-CM | POA: Diagnosis present

## 2023-04-27 DIAGNOSIS — I482 Chronic atrial fibrillation, unspecified: Secondary | ICD-10-CM | POA: Diagnosis not present

## 2023-04-27 DIAGNOSIS — Z888 Allergy status to other drugs, medicaments and biological substances status: Secondary | ICD-10-CM

## 2023-04-27 DIAGNOSIS — K922 Gastrointestinal hemorrhage, unspecified: Secondary | ICD-10-CM

## 2023-04-27 DIAGNOSIS — R1084 Generalized abdominal pain: Secondary | ICD-10-CM | POA: Diagnosis not present

## 2023-04-27 DIAGNOSIS — N400 Enlarged prostate without lower urinary tract symptoms: Secondary | ICD-10-CM | POA: Diagnosis present

## 2023-04-27 DIAGNOSIS — I252 Old myocardial infarction: Secondary | ICD-10-CM

## 2023-04-27 DIAGNOSIS — D6832 Hemorrhagic disorder due to extrinsic circulating anticoagulants: Secondary | ICD-10-CM | POA: Diagnosis present

## 2023-04-27 DIAGNOSIS — Z85828 Personal history of other malignant neoplasm of skin: Secondary | ICD-10-CM

## 2023-04-27 DIAGNOSIS — I25709 Atherosclerosis of coronary artery bypass graft(s), unspecified, with unspecified angina pectoris: Secondary | ICD-10-CM | POA: Diagnosis not present

## 2023-04-27 DIAGNOSIS — Z818 Family history of other mental and behavioral disorders: Secondary | ICD-10-CM

## 2023-04-27 DIAGNOSIS — F3340 Major depressive disorder, recurrent, in remission, unspecified: Secondary | ICD-10-CM | POA: Diagnosis present

## 2023-04-27 DIAGNOSIS — E785 Hyperlipidemia, unspecified: Secondary | ICD-10-CM | POA: Diagnosis present

## 2023-04-27 DIAGNOSIS — R531 Weakness: Secondary | ICD-10-CM | POA: Diagnosis present

## 2023-04-27 DIAGNOSIS — I110 Hypertensive heart disease with heart failure: Principal | ICD-10-CM | POA: Diagnosis present

## 2023-04-27 DIAGNOSIS — F419 Anxiety disorder, unspecified: Secondary | ICD-10-CM | POA: Diagnosis present

## 2023-04-27 DIAGNOSIS — E1121 Type 2 diabetes mellitus with diabetic nephropathy: Secondary | ICD-10-CM | POA: Diagnosis present

## 2023-04-27 DIAGNOSIS — E782 Mixed hyperlipidemia: Secondary | ICD-10-CM | POA: Diagnosis present

## 2023-04-27 DIAGNOSIS — I480 Paroxysmal atrial fibrillation: Secondary | ICD-10-CM | POA: Diagnosis not present

## 2023-04-27 DIAGNOSIS — I214 Non-ST elevation (NSTEMI) myocardial infarction: Secondary | ICD-10-CM | POA: Diagnosis not present

## 2023-04-27 DIAGNOSIS — F418 Other specified anxiety disorders: Secondary | ICD-10-CM | POA: Diagnosis present

## 2023-04-27 DIAGNOSIS — J90 Pleural effusion, not elsewhere classified: Secondary | ICD-10-CM | POA: Diagnosis not present

## 2023-04-27 DIAGNOSIS — I6782 Cerebral ischemia: Secondary | ICD-10-CM | POA: Diagnosis not present

## 2023-04-27 DIAGNOSIS — K3189 Other diseases of stomach and duodenum: Secondary | ICD-10-CM | POA: Diagnosis not present

## 2023-04-27 DIAGNOSIS — Y92129 Unspecified place in nursing home as the place of occurrence of the external cause: Secondary | ICD-10-CM | POA: Diagnosis not present

## 2023-04-27 DIAGNOSIS — I5021 Acute systolic (congestive) heart failure: Secondary | ICD-10-CM | POA: Diagnosis not present

## 2023-04-27 DIAGNOSIS — J449 Chronic obstructive pulmonary disease, unspecified: Secondary | ICD-10-CM | POA: Diagnosis present

## 2023-04-27 DIAGNOSIS — Z794 Long term (current) use of insulin: Secondary | ICD-10-CM | POA: Diagnosis not present

## 2023-04-27 DIAGNOSIS — S3991XA Unspecified injury of abdomen, initial encounter: Secondary | ICD-10-CM | POA: Diagnosis present

## 2023-04-27 DIAGNOSIS — Z66 Do not resuscitate: Secondary | ICD-10-CM | POA: Diagnosis not present

## 2023-04-27 DIAGNOSIS — Z7901 Long term (current) use of anticoagulants: Secondary | ICD-10-CM

## 2023-04-27 DIAGNOSIS — I2510 Atherosclerotic heart disease of native coronary artery without angina pectoris: Secondary | ICD-10-CM | POA: Diagnosis present

## 2023-04-27 DIAGNOSIS — D649 Anemia, unspecified: Secondary | ICD-10-CM | POA: Diagnosis present

## 2023-04-27 DIAGNOSIS — W19XXXA Unspecified fall, initial encounter: Secondary | ICD-10-CM | POA: Diagnosis present

## 2023-04-27 DIAGNOSIS — I2119 ST elevation (STEMI) myocardial infarction involving other coronary artery of inferior wall: Secondary | ICD-10-CM | POA: Diagnosis not present

## 2023-04-27 DIAGNOSIS — Z8249 Family history of ischemic heart disease and other diseases of the circulatory system: Secondary | ICD-10-CM

## 2023-04-27 DIAGNOSIS — Z7982 Long term (current) use of aspirin: Secondary | ICD-10-CM

## 2023-04-27 DIAGNOSIS — I4891 Unspecified atrial fibrillation: Secondary | ICD-10-CM | POA: Diagnosis not present

## 2023-04-27 DIAGNOSIS — I509 Heart failure, unspecified: Secondary | ICD-10-CM

## 2023-04-27 DIAGNOSIS — Z7984 Long term (current) use of oral hypoglycemic drugs: Secondary | ICD-10-CM

## 2023-04-27 DIAGNOSIS — D696 Thrombocytopenia, unspecified: Secondary | ICD-10-CM | POA: Diagnosis not present

## 2023-04-27 DIAGNOSIS — G4730 Sleep apnea, unspecified: Secondary | ICD-10-CM | POA: Diagnosis present

## 2023-04-27 DIAGNOSIS — K219 Gastro-esophageal reflux disease without esophagitis: Secondary | ICD-10-CM | POA: Diagnosis present

## 2023-04-27 DIAGNOSIS — I5030 Unspecified diastolic (congestive) heart failure: Secondary | ICD-10-CM | POA: Diagnosis not present

## 2023-04-27 DIAGNOSIS — I10 Essential (primary) hypertension: Secondary | ICD-10-CM | POA: Diagnosis not present

## 2023-04-27 DIAGNOSIS — Z951 Presence of aortocoronary bypass graft: Secondary | ICD-10-CM

## 2023-04-27 DIAGNOSIS — E860 Dehydration: Secondary | ICD-10-CM | POA: Diagnosis not present

## 2023-04-27 DIAGNOSIS — R109 Unspecified abdominal pain: Secondary | ICD-10-CM | POA: Diagnosis not present

## 2023-04-27 DIAGNOSIS — Z79899 Other long term (current) drug therapy: Secondary | ICD-10-CM

## 2023-04-27 DIAGNOSIS — T45515A Adverse effect of anticoagulants, initial encounter: Secondary | ICD-10-CM | POA: Diagnosis present

## 2023-04-27 DIAGNOSIS — F5101 Primary insomnia: Secondary | ICD-10-CM | POA: Diagnosis present

## 2023-04-27 DIAGNOSIS — Z87891 Personal history of nicotine dependence: Secondary | ICD-10-CM

## 2023-04-27 DIAGNOSIS — I209 Angina pectoris, unspecified: Secondary | ICD-10-CM | POA: Diagnosis not present

## 2023-04-27 DIAGNOSIS — Z882 Allergy status to sulfonamides status: Secondary | ICD-10-CM

## 2023-04-27 DIAGNOSIS — Z955 Presence of coronary angioplasty implant and graft: Secondary | ICD-10-CM

## 2023-04-27 DIAGNOSIS — D62 Acute posthemorrhagic anemia: Secondary | ICD-10-CM

## 2023-04-27 DIAGNOSIS — K2960 Other gastritis without bleeding: Secondary | ICD-10-CM | POA: Diagnosis not present

## 2023-04-27 DIAGNOSIS — I5033 Acute on chronic diastolic (congestive) heart failure: Secondary | ICD-10-CM | POA: Diagnosis not present

## 2023-04-27 DIAGNOSIS — Z833 Family history of diabetes mellitus: Secondary | ICD-10-CM

## 2023-04-27 DIAGNOSIS — Z825 Family history of asthma and other chronic lower respiratory diseases: Secondary | ICD-10-CM

## 2023-04-27 DIAGNOSIS — Z885 Allergy status to narcotic agent status: Secondary | ICD-10-CM

## 2023-04-27 DIAGNOSIS — Z7951 Long term (current) use of inhaled steroids: Secondary | ICD-10-CM

## 2023-04-27 DIAGNOSIS — K900 Celiac disease: Secondary | ICD-10-CM | POA: Diagnosis present

## 2023-04-27 DIAGNOSIS — Z87442 Personal history of urinary calculi: Secondary | ICD-10-CM

## 2023-04-27 DIAGNOSIS — F339 Major depressive disorder, recurrent, unspecified: Secondary | ICD-10-CM | POA: Diagnosis present

## 2023-04-27 DIAGNOSIS — I4821 Permanent atrial fibrillation: Secondary | ICD-10-CM | POA: Diagnosis present

## 2023-04-27 DIAGNOSIS — J4489 Other specified chronic obstructive pulmonary disease: Secondary | ICD-10-CM | POA: Diagnosis present

## 2023-04-27 DIAGNOSIS — D509 Iron deficiency anemia, unspecified: Secondary | ICD-10-CM | POA: Diagnosis present

## 2023-04-27 DIAGNOSIS — Z9981 Dependence on supplemental oxygen: Secondary | ICD-10-CM

## 2023-04-27 DIAGNOSIS — R4182 Altered mental status, unspecified: Secondary | ICD-10-CM | POA: Diagnosis not present

## 2023-04-27 LAB — HEPATIC FUNCTION PANEL
ALT: 18 U/L (ref 0–44)
AST: 18 U/L (ref 15–41)
Albumin: 3 g/dL — ABNORMAL LOW (ref 3.5–5.0)
Alkaline Phosphatase: 53 U/L (ref 38–126)
Bilirubin, Direct: 0.2 mg/dL (ref 0.0–0.2)
Indirect Bilirubin: 0.6 mg/dL (ref 0.3–0.9)
Total Bilirubin: 0.8 mg/dL (ref 0.3–1.2)
Total Protein: 6 g/dL — ABNORMAL LOW (ref 6.5–8.1)

## 2023-04-27 LAB — BASIC METABOLIC PANEL
Anion gap: 9 (ref 5–15)
BUN: 16 mg/dL (ref 8–23)
CO2: 21 mmol/L — ABNORMAL LOW (ref 22–32)
Calcium: 7.6 mg/dL — ABNORMAL LOW (ref 8.9–10.3)
Chloride: 110 mmol/L (ref 98–111)
Creatinine, Ser: 0.89 mg/dL (ref 0.61–1.24)
GFR, Estimated: >60 mL/min (ref >60)
Glucose, Bld: 112 mg/dL — ABNORMAL HIGH (ref 70–99)
Potassium: 3.6 mmol/L (ref 3.5–5.1)
Sodium: 140 mmol/L (ref 135–145)

## 2023-04-27 LAB — LACTIC ACID, PLASMA
Lactic Acid, Venous: 1.7 mmol/L (ref 0.5–1.9)
Lactic Acid, Venous: 1.3 mmol/L (ref 0.5–1.9)

## 2023-04-27 LAB — CBC
HCT: 38.3 % — ABNORMAL LOW (ref 39.0–52.0)
Hemoglobin: 11.7 g/dL — ABNORMAL LOW (ref 13.0–17.0)
MCH: 27 pg (ref 26.0–34.0)
MCHC: 30.5 g/dL (ref 30.0–36.0)
MCV: 88.5 fL (ref 80.0–100.0)
Platelets: 118 10*3/uL — ABNORMAL LOW (ref 150–400)
RBC: 4.33 MIL/uL (ref 4.22–5.81)
RDW: 16.5 % — ABNORMAL HIGH (ref 11.5–15.5)
WBC: 6.9 10*3/uL (ref 4.0–10.5)
nRBC: 0 % (ref 0.0–0.2)

## 2023-04-27 LAB — FERRITIN: Ferritin: 54 ng/mL (ref 24–336)

## 2023-04-27 LAB — LIPASE, BLOOD: Lipase: 21 U/L (ref 11–51)

## 2023-04-27 LAB — IRON AND TIBC
Iron: 22 ug/dL — ABNORMAL LOW (ref 45–182)
Saturation Ratios: 7 % — ABNORMAL LOW (ref 17.9–39.5)
TIBC: 316 ug/dL (ref 250–450)
UIBC: 294 ug/dL

## 2023-04-27 LAB — URINALYSIS, ROUTINE W REFLEX MICROSCOPIC
Bacteria, UA: NONE SEEN
Bilirubin Urine: NEGATIVE
Glucose, UA: >=500 mg/dL — AB
Hgb urine dipstick: NEGATIVE
Ketones, ur: 5 mg/dL — AB
Leukocytes,Ua: NEGATIVE
Nitrite: NEGATIVE
Protein, ur: 30 mg/dL — AB
Specific Gravity, Urine: 1.025 (ref 1.005–1.030)
Squamous Epithelial / HPF: NONE SEEN /HPF (ref 0–5)
pH: 5 (ref 5.0–8.0)

## 2023-04-27 LAB — TROPONIN I (HIGH SENSITIVITY)
Troponin I (High Sensitivity): 17 ng/L (ref <18)
Troponin I (High Sensitivity): 19 ng/L — ABNORMAL HIGH (ref <18)

## 2023-04-27 LAB — RETICULOCYTES
Immature Retic Fract: 29.2 % — ABNORMAL HIGH (ref 2.3–15.9)
RBC.: 4.87 MIL/uL (ref 4.22–5.81)
Retic Count, Absolute: 79.9 10*3/uL (ref 19.0–186.0)
Retic Ct Pct: 1.6 % (ref 0.4–3.1)

## 2023-04-27 LAB — BRAIN NATRIURETIC PEPTIDE: B Natriuretic Peptide: 1093.5 pg/mL — ABNORMAL HIGH (ref 0.0–100.0)

## 2023-04-27 LAB — VITAMIN B12: Vitamin B-12: 625 pg/mL (ref 180–914)

## 2023-04-27 LAB — CBG MONITORING, ED: Glucose-Capillary: 107 mg/dL — ABNORMAL HIGH (ref 70–99)

## 2023-04-27 LAB — ETHANOL: Alcohol, Ethyl (B): <10 mg/dL (ref <10)

## 2023-04-27 LAB — FOLATE: Folate: 14.7 ng/mL (ref >5.9)

## 2023-04-27 MED ORDER — ACETAMINOPHEN 650 MG RE SUPP: 650.0000 mg | Freq: Four times a day (QID) | RECTAL | Status: AC | PRN

## 2023-04-27 MED ORDER — UMECLIDINIUM BROMIDE 62.5 MCG/ACT IN AEPB
1.0000 | INHALATION_SPRAY | Freq: Every day | RESPIRATORY_TRACT | Status: DC
  Administered 2023-04-28 – 2023-05-04 (×6): 1 via RESPIRATORY_TRACT
  Filled 2023-04-27: qty 7

## 2023-04-27 MED ORDER — IPRATROPIUM BROMIDE 0.02 % IN SOLN: 0.5000 mg | Freq: Four times a day (QID) | RESPIRATORY_TRACT | Status: DC | PRN

## 2023-04-27 MED ORDER — FENTANYL CITRATE PF 50 MCG/ML IJ SOSY
50.0000 ug | PREFILLED_SYRINGE | Freq: Once | INTRAMUSCULAR | Status: AC
  Administered 2023-04-27: 50 ug via INTRAVENOUS
  Filled 2023-04-27: qty 1

## 2023-04-27 MED ORDER — MELATONIN 5 MG PO TABS
5.0000 mg | ORAL_TABLET | Freq: Every evening | ORAL | Status: DC | PRN
  Administered 2023-05-03: 5 mg via ORAL
  Filled 2023-04-27: qty 1

## 2023-04-27 MED ORDER — PANTOPRAZOLE INFUSION (NEW) - SIMPLE MED: 8.0000 mg/h | INTRAVENOUS | Status: DC

## 2023-04-27 MED ORDER — BUDESON-GLYCOPYRROL-FORMOTEROL 160-9-4.8 MCG/ACT IN AERO: 2.0000 | INHALATION_SPRAY | RESPIRATORY_TRACT | Status: DC

## 2023-04-27 MED ORDER — ALPRAZOLAM 0.5 MG PO TABS: 1.0000 mg | ORAL_TABLET | Freq: Three times a day (TID) | ORAL | Status: DC | PRN

## 2023-04-27 MED ORDER — BUDESONIDE 0.5 MG/2ML IN SUSP
2.0000 mL | Freq: Two times a day (BID) | RESPIRATORY_TRACT | Status: DC
  Administered 2023-04-27 – 2023-05-04 (×14): 0.5 mg via RESPIRATORY_TRACT
  Filled 2023-04-27 (×14): qty 2

## 2023-04-27 MED ORDER — PANTOPRAZOLE SODIUM 40 MG IV SOLR: 40.0000 mg | Freq: Two times a day (BID) | INTRAVENOUS | Status: DC

## 2023-04-27 MED ORDER — PANTOPRAZOLE SODIUM 40 MG IV SOLR
40.0000 mg | Freq: Two times a day (BID) | INTRAVENOUS | Status: DC
  Administered 2023-05-01 – 2023-05-02 (×3): 40 mg via INTRAVENOUS
  Filled 2023-04-27 (×3): qty 10

## 2023-04-27 MED ORDER — OXYCODONE-ACETAMINOPHEN 10-325 MG PO TABS: 1.0000 | ORAL_TABLET | ORAL | Status: DC | PRN

## 2023-04-27 MED ORDER — IOHEXOL 300 MG/ML  SOLN
100.0000 mL | Freq: Once | INTRAMUSCULAR | Status: AC | PRN
  Administered 2023-04-27: 100 mL via INTRAVENOUS

## 2023-04-27 MED ORDER — ONDANSETRON HCL 4 MG PO TABS: 4.0000 mg | ORAL_TABLET | Freq: Four times a day (QID) | ORAL | Status: AC | PRN

## 2023-04-27 MED ORDER — PANTOPRAZOLE 80MG IVPB - SIMPLE MED
80.0000 mg | Freq: Once | INTRAVENOUS | Status: AC
  Administered 2023-04-27: 80 mg via INTRAVENOUS
  Filled 2023-04-27: qty 100

## 2023-04-27 MED ORDER — ACETAMINOPHEN 325 MG PO TABS
650.0000 mg | ORAL_TABLET | Freq: Four times a day (QID) | ORAL | Status: AC | PRN
  Administered 2023-04-30: 650 mg via ORAL
  Filled 2023-04-27: qty 2

## 2023-04-27 MED ORDER — ARFORMOTEROL TARTRATE 15 MCG/2ML IN NEBU
15.0000 ug | INHALATION_SOLUTION | Freq: Two times a day (BID) | RESPIRATORY_TRACT | Status: DC
  Administered 2023-04-27 – 2023-05-04 (×13): 15 ug via RESPIRATORY_TRACT
  Filled 2023-04-27 (×16): qty 2

## 2023-04-27 MED ORDER — OXYCODONE HCL 5 MG PO TABS
5.0000 mg | ORAL_TABLET | ORAL | Status: DC | PRN
  Administered 2023-04-29 – 2023-05-04 (×14): 5 mg via ORAL
  Filled 2023-04-27 (×15): qty 1

## 2023-04-27 MED ORDER — VENLAFAXINE HCL ER 75 MG PO CP24
75.0000 mg | ORAL_CAPSULE | Freq: Every day | ORAL | Status: DC
  Administered 2023-04-28 – 2023-05-04 (×7): 75 mg via ORAL
  Filled 2023-04-27 (×7): qty 1

## 2023-04-27 MED ORDER — PANTOPRAZOLE 80MG IVPB - SIMPLE MED: 80.0000 mg | Freq: Once | INTRAVENOUS | Status: DC

## 2023-04-27 MED ORDER — ONDANSETRON HCL 4 MG/2ML IJ SOLN
4.0000 mg | Freq: Four times a day (QID) | INTRAMUSCULAR | Status: AC | PRN
  Administered 2023-04-30 – 2023-05-02 (×4): 4 mg via INTRAVENOUS
  Filled 2023-04-27 (×4): qty 2

## 2023-04-27 MED ORDER — HYDROMORPHONE HCL 1 MG/ML IJ SOLN
0.5000 mg | Freq: Once | INTRAMUSCULAR | Status: AC
  Administered 2023-04-27: 0.5 mg via INTRAVENOUS
  Filled 2023-04-27: qty 0.5

## 2023-04-27 MED ORDER — OXYCODONE-ACETAMINOPHEN 5-325 MG PO TABS
1.0000 | ORAL_TABLET | ORAL | Status: DC | PRN
  Administered 2023-04-27 – 2023-05-04 (×20): 1 via ORAL
  Filled 2023-04-27 (×21): qty 1

## 2023-04-27 MED ORDER — SENNOSIDES-DOCUSATE SODIUM 8.6-50 MG PO TABS
1.0000 | ORAL_TABLET | Freq: Every evening | ORAL | Status: DC | PRN
  Administered 2023-04-29: 1 via ORAL
  Filled 2023-04-27: qty 1

## 2023-04-27 MED ORDER — NITROGLYCERIN 0.4 MG SL SUBL
0.4000 mg | SUBLINGUAL_TABLET | SUBLINGUAL | Status: DC | PRN
  Administered 2023-05-01: 0.4 mg via SUBLINGUAL
  Filled 2023-04-27: qty 1

## 2023-04-27 MED ORDER — MECLIZINE HCL 25 MG PO TABS: 25.0000 mg | ORAL_TABLET | Freq: Three times a day (TID) | ORAL | Status: DC | PRN

## 2023-04-27 MED ORDER — FUROSEMIDE 10 MG/ML IJ SOLN
80.0000 mg | Freq: Once | INTRAMUSCULAR | Status: AC
  Administered 2023-04-27: 80 mg via INTRAVENOUS
  Filled 2023-04-27: qty 8

## 2023-04-27 MED ORDER — ATORVASTATIN CALCIUM 80 MG PO TABS
80.0000 mg | ORAL_TABLET | Freq: Every day | ORAL | Status: DC
  Administered 2023-04-28 – 2023-05-04 (×7): 80 mg via ORAL
  Filled 2023-04-27 (×2): qty 1
  Filled 2023-04-27: qty 4
  Filled 2023-04-27 (×2): qty 1
  Filled 2023-04-27: qty 4
  Filled 2023-04-27: qty 1

## 2023-04-27 MED ORDER — RANOLAZINE ER 500 MG PO TB12
1000.0000 mg | ORAL_TABLET | Freq: Two times a day (BID) | ORAL | Status: DC
  Administered 2023-04-27 – 2023-05-04 (×14): 1000 mg via ORAL
  Filled 2023-04-27 (×15): qty 2

## 2023-04-27 MED ORDER — INSULIN ASPART 100 UNIT/ML IJ SOLN
0.0000 [IU] | Freq: Three times a day (TID) | INTRAMUSCULAR | Status: DC
  Administered 2023-04-28 – 2023-04-29 (×2): 3 [IU] via SUBCUTANEOUS
  Administered 2023-04-30: 5 [IU] via SUBCUTANEOUS
  Administered 2023-05-02 (×2): 3 [IU] via SUBCUTANEOUS
  Administered 2023-05-02 – 2023-05-04 (×4): 2 [IU] via SUBCUTANEOUS
  Filled 2023-04-27 (×8): qty 1

## 2023-04-27 MED ORDER — ALBUTEROL SULFATE (2.5 MG/3ML) 0.083% IN NEBU: 3.0000 mL | INHALATION_SOLUTION | Freq: Four times a day (QID) | RESPIRATORY_TRACT | Status: AC | PRN

## 2023-04-27 MED ORDER — ONDANSETRON HCL 4 MG/2ML IJ SOLN
4.0000 mg | Freq: Once | INTRAMUSCULAR | Status: AC
  Administered 2023-04-27: 4 mg via INTRAVENOUS
  Filled 2023-04-27: qty 2

## 2023-04-27 MED ORDER — TRAZODONE HCL 50 MG PO TABS
150.0000 mg | ORAL_TABLET | Freq: Every day | ORAL | Status: DC
  Administered 2023-04-27 – 2023-05-03 (×7): 150 mg via ORAL
  Filled 2023-04-27 (×7): qty 1

## 2023-04-27 MED ORDER — INSULIN ASPART 100 UNIT/ML IJ SOLN: 0.0000 [IU] | Freq: Every day | INTRAMUSCULAR | Status: DC

## 2023-04-27 MED ORDER — LORAZEPAM 0.5 MG PO TABS
0.5000 mg | ORAL_TABLET | Freq: Two times a day (BID) | ORAL | Status: AC | PRN
  Administered 2023-04-27: 0.5 mg via ORAL
  Filled 2023-04-27: qty 1

## 2023-04-27 MED ORDER — PANTOPRAZOLE INFUSION (NEW) - SIMPLE MED
8.0000 mg/h | INTRAVENOUS | Status: AC
  Administered 2023-04-27 – 2023-04-30 (×6): 8 mg/h via INTRAVENOUS
  Filled 2023-04-27 (×6): qty 100

## 2023-04-27 MED ORDER — AMIODARONE HCL 200 MG PO TABS
200.0000 mg | ORAL_TABLET | Freq: Two times a day (BID) | ORAL | Status: DC
  Administered 2023-04-27 – 2023-05-03 (×12): 200 mg via ORAL
  Filled 2023-04-27 (×12): qty 1

## 2023-04-27 MED ORDER — HYDRALAZINE HCL 20 MG/ML IJ SOLN: 5.0000 mg | Freq: Three times a day (TID) | INTRAMUSCULAR | Status: AC | PRN

## 2023-04-27 NOTE — Assessment & Plan Note (Addendum)
Check anemia panel Suspect secondary to upper GI bleed Continue Protonix GGT, home Eliquis not resumed on admission Gastroenterology service has been consulted via secure chat and Epic order to Dr. Allegra Lai Admit to telemetry cardiac, inpatient

## 2023-04-27 NOTE — Assessment & Plan Note (Signed)
-   Does not appear to be in acute exacerbation at this time 

## 2023-04-27 NOTE — Assessment & Plan Note (Signed)
-   Atorvastatin 80 mg daily resumed 

## 2023-04-27 NOTE — Assessment & Plan Note (Signed)
-   Melatonin 5 mg nightly as needed for sleep ordered ?

## 2023-04-27 NOTE — ED Notes (Signed)
Pt transported to CT ?

## 2023-04-27 NOTE — ED Notes (Signed)
Patient requesting something to help him sleep.  States he normally takes Ativan.  Patient medicated per PRN order

## 2023-04-27 NOTE — Assessment & Plan Note (Signed)
Home atorvastatin 80 mg daily, Ranexa 1000 mg p.o. twice daily Home aspirin and lisinopril were not resumed on admission due to low normal tensive and patient presenting with concerns of upper GI bleed

## 2023-04-27 NOTE — ED Provider Notes (Signed)
[facility] Provider Note    Event Date/Time   First MD Initiated Contact with Patient 04/27/23 1441     (approximate)   History   Fall and Weakness   HPI  Lawrence [name] is a 67 y.o. male with past medical history of CAD status post STEMI, chronic angina, heart failure with preserved EF, A-fib, COPD, here with epigastric discomfort and generalized weakness.  The patient states that today, he woke up, and essentially is unable to support himself due to weakness.  Has history of multiple previous admissions for similar weakness.  He states he has had some mild nausea.  No vomiting.  No fevers or chills.  He states that he felt overall well yesterday.  No recent medication changes.  He ate and drink normally.     Physical Exam   Triage Vital Signs: ED Triage Vitals [04/27/23 1159]  Enc Vitals Group     BP (!) 125/101     Pulse Rate 71     Resp 18     Temp 97.8 F (36.6 C)     Temp Source Oral     SpO2 94 %     Weight 180 lb (81.6 kg)     Height      Head Circumference      Peak Flow      Pain Score 0     Pain Loc      Pain Edu?      Excl. in GC?     Most recent vital signs: Vitals:   04/27/23 1417 04/27/23 1430  BP: (!) 189/113 (!) 183/97  Pulse: 61 77  Resp:  14  Temp:    SpO2: 100% 100%     General: Awake, no distress.  CV:  Good peripheral perfusion.  Regular and rhythm.  No murmurs. Resp:  Normal work of breathing.  Lungs clear to auscultation bilaterally. Abd:  No distention.  Moderate epigastric tenderness, no rebound or guarding. Other:  Mildly dry mucous membranes.   ED Results / Procedures / Treatments   Labs (all labs ordered are listed, but only abnormal results are displayed) Labs Reviewed  BASIC METABOLIC PANEL - Abnormal; Notable for the following components:      Result Value   CO2 21 (*)    Glucose, Bld 112 (*)    Calcium 7.6 (*)    All other components within normal limits  CBC - Abnormal; Notable for the  following components:   Hemoglobin 11.7 (*)    HCT 38.3 (*)    RDW 16.5 (*)    Platelets 118 (*)    All other components within normal limits  HEPATIC FUNCTION PANEL  LIPASE, BLOOD  LACTIC ACID, PLASMA  LACTIC ACID, PLASMA  ETHANOL  URINALYSIS, ROUTINE W REFLEX MICROSCOPIC  BRAIN NATRIURETIC PEPTIDE  TROPONIN I (HIGH SENSITIVITY)     EKG Atrial fibrillation, trickle rate 71.  QRS 90, QTc [address] elevations or depression or acute evidence of acute ischemia or infarct.   RADIOLOGY CT head: Pending Chest x-ray: Pending   I also independently reviewed and agree with radiologist interpretations.   PROCEDURES:  Critical Care performed: No   MEDICATIONS ORDERED IN ED: Medications - No data to display   IMPRESSION / MDM / ASSESSMENT AND PLAN / ED COURSE  I reviewed the triage vital signs and the nursing notes.  Differential diagnosis includes, but is not limited to, occult infection (UTI, PNA), gastritis, pancreatitis, dehydration, CHF, ACS   Patient's presentation is most consistent with acute presentation with potential threat to life or bodily function.  The patient is on the cardiac monitor to evaluate for evidence of arrhythmia and/or significant heart rate changes  67 yo M with PMHx CAD, HFpEF, COPD, here with generalized weakness and epigastric/abd pain. Initial labs overall reassuring -  mild dehydration noted on UA and BMP. CBC with moderate drop in Hgb, however, fro 15.6 to 11.7. Reported h/o hiatal hernia. Pt is on Eliquis. Reports he has had some dark stools. Will check CT, reassess.   FINAL CLINICAL IMPRESSION(S) / ED DIAGNOSES   Final diagnoses:  Generalized weakness  Gastrointestinal hemorrhage, unspecified gastrointestinal hemorrhage type  Acute on chronic congestive heart failure, unspecified heart failure type [facility])     Shaune Pollack, MD 04/27/23 1935

## 2023-04-27 NOTE — Assessment & Plan Note (Signed)
PPI ?

## 2023-04-27 NOTE — ED Triage Notes (Signed)
Arrives from home via [facility]. Slipped off of couch and onto floor.  Did not hit head.  Denies LOC.  Patient takes eliquis.  Also c/o abdominal pain.    180/100 Afib:  80-100 88% RA--3l/ [name] palced on patient.  20g FRA - 500cc NS given PTA 15 mcg fentanyl for abd pain.  Last pressure 150/100

## 2023-04-27 NOTE — ED Notes (Signed)
Patient awake and reports pain is currently 9/10

## 2023-04-27 NOTE — Assessment & Plan Note (Signed)
Home metformin and glipizide not resumed on admission Insulin SSI with agents coverage ordered

## 2023-04-27 NOTE — ED Notes (Signed)
Patient noted to be in wet shorts.  Patient reports he is having trouble using a urinal and is unable to ambulate to bathroom.  Sheets changed and pad placed under patient.  Offered external catheter and patient in agreement.

## 2023-04-27 NOTE — Assessment & Plan Note (Signed)
Ativan 0.5 mg p.o. twice daily as needed for anxiety, 1 day ordered

## 2023-04-27 NOTE — H&P (Signed)
History and Physical   Lawrence [name] ZOX:[id number] DOB: 04-20-1956 DOA: 04/27/2023  PCP: Malva Limes, MD  Patient coming from: [name] via EMS  I have personally briefly reviewed patient's old medical records in [facility] EMR.  Chief Concern: Weakness  HPI: Lawrence [name] is a 67 year old male with history of hyperlipidemia, non-insulin-dependent diabetes mellitus type 2, hypertension, CAD status post PCI, paroxysmal atrial fibrillation on Eliquis, heart failure preserved ejection fraction, who presents to the emergency department for chief concerns of generalized weakness.  Vitals in the ED showed temperature of 97.8, respiration rate of 18, heart rate of 71, blood pressure 125/101, SpO2 of 94% on 3 L nasal cannula.  Serum sodium is 140, potassium 3.6, chloride 110, bicarb 21, BUN of 16, serum creatinine of 0.89, EGFR greater than 60, nonfasting blood glucose 112, WBC 6.9, hemoglobin 11.7, platelets of 118.  BNP was elevated at 1093.5, high sensitive troponin was 17, lactic acid was 1.7 on repeat was 1.3.  UA was negative for leukocytes and nitrates.  ED treatment: Fentanyl 50 mcg one-time dose, furosemide 80 mg IV one-time dose, Dilaudid 0.5 mg IV one-time dose, ondansetron 4 mg IV one-time dose, Protonix bolus and GGT ordered. -------------------------- At bedside, patient was able to tell me his name, age, location, current calendar year.  He reports today he was sitting on his couch watching TV when he tried to get up, he slid down the couch and was not able to stand despite multiple attempts.  He denies trauma to his person. He states that he has not noticed the color of his stool. He denies NSAID use, alcohol use. He endorses compliance with his having Eliquis.  He denies chest pain, abdominal pain, dysuria, hematuria, diarrhea, swelling of his lower extremities.  Social history: He is currently at [facility] for rehab. He denies tobacco, etoh, and recreational  drug use.   ROS: Constitutional: no weight change, no fever ENT/Mouth: no sore throat, no rhinorrhea Eyes: no eye pain, no vision changes Cardiovascular: no chest pain, no dyspnea,  no edema, no palpitations Respiratory: no cough, no sputum, no wheezing Gastrointestinal: no nausea, no vomiting, no diarrhea, no constipation Genitourinary: no urinary incontinence, no dysuria, no hematuria Musculoskeletal: no arthralgias, no myalgias Skin: no skin lesions, no pruritus, Neuro: + weakness, no loss of consciousness, no syncope Psych: no anxiety, no depression, + decrease appetite Heme/Lymph: no bruising, no bleeding  ED Course: Discussed with emergency medicine provider, patient requiring hospitalization for chief concerns of symptomatic anemia concerning for upper GI bleed.  Assessment/Plan  Principal Problem:   Symptomatic anemia Active Problems:   BPH (benign prostatic hyperplasia)   Paroxysmal atrial fibrillation (HCC)   GERD without esophagitis   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HCC)   Former tobacco use   Chronic obstructive pulmonary disease (COPD) (HCC)   Anxiety   Recurrent major depressive disorder, remission status unspecified (HCC)   Primary insomnia   Dyslipidemia   Hx of CABG   Coronary artery disease   Assessment and Plan:  * Symptomatic anemia Check anemia panel Suspect secondary to upper GI bleed Continue Protonix GGT, home Eliquis not resumed on admission Gastroenterology service has been consulted via secure chat and Epic order to Dr. Allegra Lai Admit to telemetry cardiac, inpatient  Paroxysmal atrial fibrillation (HCC) Resumed home amiodarone 200 mg p.o. twice daily Home Eliquis not resumed on admission due to concerns of upper GI bleed AM team to resume home Eliquis when the benefits outweigh the risk  GERD without esophagitis PPI  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HCC) Home metformin and glipizide not resumed on  admission Insulin SSI with agents coverage ordered  Coronary artery disease Home atorvastatin 80 mg daily, Ranexa 1000 mg p.o. twice daily Home aspirin and lisinopril were not resumed on admission due to low normal tensive and patient presenting with concerns of upper GI bleed  Dyslipidemia Atorvastatin 80 mg daily resumed  Primary insomnia Melatonin 5 mg nightly as needed for sleep ordered  Anxiety Ativan 0.5 mg p.o. twice daily as needed for anxiety, 1 day ordered  Chronic obstructive pulmonary disease (COPD) (HCC) Does not appear to be in acute exacerbation at this time  Chart reviewed.   DVT prophylaxis: TED hose; AM team to initiate pharmacologic DVT prophylaxis when the benefits outweigh the risk Code Status: full code Diet: Clear liquids Family Communication: Attempted to call son [name] at patient's request.  I called [phone], no pickup and the voicemail box was full Disposition Plan: pending GI evaluation Consults called: Gastroenterology Admission status: Telemetry cardiac, inpatient  Past Medical History:  Diagnosis Date   A-fib (HCC)    Anemia    Anginal pain (HCC)    Anxiety    Arthritis    Asthma    CAD (coronary artery disease)    a. 2002 CABGx2 (LIMA->LAD, VG->VG->OM1);  b. 09/2012 DES->OM;  c. 03/2015 PTCA of LAD [facility]) in setting of atretic LIMA; d. 05/2015 Cath [facility]): nonobs dzs; e. 06/2015 Cath ([facility]): LM nl, LAD 45p/d ISR, 50d, D1/2 small, LCX 50p/d ISR, OM1 70ost, 30 ISR, VG->OM1 50ost, 27m, LIMA->LAD 99p/d - atretic, RCA dom, nl; f.cath 10/16: 40-50%(FFR 0.90) pLAD, 75% (FFR 0.77) mLAD s/p PCI/DES, oRCA 40% (FFR0.95)   Cancer (HCC)    SKIN CANCER ON BACK   Celiac disease    Chronic diastolic CHF (congestive heart failure) (HCC)    a. 06/2009 Echo: EF 60-65%, Gr 1 DD, triv AI, mildly dil LA, nl RV.   COPD (chronic obstructive pulmonary disease) (HCC)    a. Chronic bronchitis and emphysema.   DDD (degenerative disc disease), lumbar     Diverticulosis    Dysrhythmia    Essential hypertension    GERD (gastroesophageal reflux disease)    History of hiatal hernia    History of kidney stones    H/O   History of tobacco abuse    a. Quit 2014.   Myocardial infarction [facility]) 2002   4 STENTS   Pancreatitis    PSVT (paroxysmal supraventricular tachycardia)    a. 10/2012 Noted on Zio Patch.   Sleep apnea    LOST CORD TO CPAP -ONLY 02 @ BEDTIME   Tubular adenoma of colon    Type II diabetes mellitus (HCC)    Past Surgical History:  Procedure Laterality Date   BYPASS GRAFT     CARDIAC CATHETERIZATION N/A 07/12/2015   rocedure: Left Heart Cath and Cors/Grafts Angiography;  Surgeon: Lyn Records, MD;  Location: [facility] INVASIVE CV LAB;  Service: Cardiovascular;  Laterality: N/A;   CARDIAC CATHETERIZATION Right 10/07/2015   Procedure: Left Heart Cath and Cors/Grafts Angiography;  Surgeon: Laurier Nancy, MD;  Location: [facility] INVASIVE CV LAB;  Service: Cardiovascular;  Laterality: Right;   CARDIAC CATHETERIZATION N/A 04/06/2016   Procedure: Left Heart Cath and Coronary Angiography;  Surgeon: Alwyn Pea, MD;  Location: [facility] INVASIVE CV LAB;  Service: Cardiovascular;  Laterality: N/A;   CARDIAC CATHETERIZATION  04/06/2016   Procedure: Bypass Graft Angiography;  Surgeon: Alwyn Pea,  MD;  Location: [facility] INVASIVE CV LAB;  Service: Cardiovascular;;   CARDIAC CATHETERIZATION N/A 11/02/2016   Procedure: Left Heart Cath and Cors/Grafts Angiography and possible PCI;  Surgeon: Alwyn Pea, MD;  Location: [facility] INVASIVE CV LAB;  Service: Cardiovascular;  Laterality: N/A;   CARDIAC CATHETERIZATION N/A 11/02/2016   Procedure: Coronary Stent Intervention;  Surgeon: Alwyn Pea, MD;  Location: [facility] INVASIVE CV LAB;  Service: Cardiovascular;  Laterality: N/A;   CARDIOVERSION N/A 06/29/2022   Procedure: CARDIOVERSION;  Surgeon: Lamar Blinks, MD;  Location: [facility] ORS;  Service: Cardiovascular;  Laterality: N/A;   CHOLECYSTECTOMY      CIRCUMCISION N/A 06/09/2019   Procedure: CIRCUMCISION ADULT;  Surgeon: Sondra Come, MD;  Location: [facility] ORS;  Service: Urology;  Laterality: N/A;   COLONOSCOPY WITH PROPOFOL N/A 04/01/2018   Procedure: COLONOSCOPY WITH PROPOFOL;  Surgeon: Scot Jun, MD;  Location: [facility] ENDOSCOPY;  Service: Endoscopy;  Laterality: N/A;   ESOPHAGEAL DILATION     ESOPHAGOGASTRODUODENOSCOPY (EGD) WITH PROPOFOL N/A 04/01/2018   Procedure: ESOPHAGOGASTRODUODENOSCOPY (EGD) WITH PROPOFOL;  Surgeon: Scot Jun, MD;  Location: [facility] ENDOSCOPY;  Service: Endoscopy;  Laterality: N/A;   LEFT HEART CATH AND CORS/GRAFTS ANGIOGRAPHY N/A 06/12/2019   Procedure: LEFT HEART CATH AND CORS/GRAFTS ANGIOGRAPHY;  Surgeon: Dalia Heading, MD;  Location: [facility] INVASIVE CV LAB;  Service: Cardiovascular;  Laterality: N/A;   LEFT HEART CATH AND CORS/GRAFTS ANGIOGRAPHY N/A 03/11/2020   Procedure: LEFT HEART CATH AND CORS/GRAFTS ANGIOGRAPHY;  Surgeon: Marcina Millard, MD;  Location: [facility] INVASIVE CV LAB;  Service: Cardiovascular;  Laterality: N/A;   LEFT HEART CATH AND CORS/GRAFTS ANGIOGRAPHY N/A 05/01/2021   Procedure: LEFT HEART CATH AND CORS/GRAFTS ANGIOGRAPHY;  Surgeon: Lamar Blinks, MD;  Location: [facility] INVASIVE CV LAB;  Service: Cardiovascular;  Laterality: N/A;   LEFT HEART CATH AND CORS/GRAFTS ANGIOGRAPHY N/A 11/02/2022   Procedure: LEFT HEART CATH AND CORS/GRAFTS ANGIOGRAPHY;  Surgeon: Alwyn Pea, MD;  Location: [facility] INVASIVE CV LAB;  Service: Cardiovascular;  Laterality: N/A;   TONSILLECTOMY     VASCULAR SURGERY     Social History:  reports that he quit smoking about 10 years ago. His smoking use included cigarettes. He has a 150.00 pack-year smoking history. He has never used smokeless tobacco. He reports current drug use. Drug: Marijuana. He reports that he does not drink alcohol.  Allergies  Allergen Reactions   Prednisone Other (See Comments) and Hypertension    Pt states that this medication  puts him in A-fib    Demerol  [Meperidine Hcl]    Demerol [Meperidine] Hives   Jardiance [Empagliflozin] Other (See Comments)    Perineal pain   Sulfa Antibiotics Hives   Albuterol Sulfate [Albuterol] Palpitations and Other (See Comments)    Pt currently uses this medication.     Morphine Sulfate Nausea And Vomiting, Rash and Other (See Comments)    Pt states that he is only allergic to the tablet form of this medication.     Family History  Problem Relation Age of Onset   Heart attack Mother    Depression Mother    Heart disease Mother    COPD Mother    Hypertension Mother    Heart attack Father    Diabetes Father    Depression Father    Heart disease Father    Cirrhosis Father    Parkinson's disease Brother    Family history: Family history reviewed and not pertinent  Prior to Admission medications   Medication Sig  Start Date End Date Taking? Authorizing Provider  Accu-Chek Softclix Lancets lancets Use as instructed to check sugar daily for type 2 diabetes. 03/03/21   Malva Limes, MD  acetaminophen (TYLENOL) 325 MG tablet Take 2 tablets (650 mg total) by mouth every 4 (four) hours as needed for headache or mild pain. 08/07/22   Rolly Salter, MD  albuterol (VENTOLIN HFA) 108 (90 Base) MCG/ACT inhaler INHALE 2 PUFFS BY MOUTH EVERY 6 HOURS AS NEEDED FOR SHORTNESS OF BREATH Patient not taking: Reported on 03/28/2023 12/02/21   Malva Limes, MD  allopurinol (ZYLOPRIM) 300 MG tablet TAKE 1 TABLET BY MOUTH TWICE A DAY 03/13/23   Malva Limes, MD  amiodarone (PACERONE) 200 MG tablet Take 200 mg by mouth 2 (two) times daily.    [provider]  aspirin 81 MG chewable tablet Chew 81 mg by mouth daily. Swallows whole    [provider]  atorvastatin (LIPITOR) 80 MG tablet Take 1 tablet (80 mg total) by mouth daily. Please schedule office visit before any future refill. 11/19/22   Malva Limes, MD  Blood Glucose Calibration (ACCU-CHEK GUIDE CONTROL) LIQD Use  with blood glucose monitor as directed 02/20/21   Malva Limes, MD  Blood Glucose Monitoring Suppl (ACCU-CHEK GUIDE) w/Device KIT Use to check blood sugars as directed 02/20/21   Malva Limes, MD  Budeson-Glycopyrrol-Formoterol (BREZTRI AEROSPHERE) 160-9-4.8 MCG/ACT AERO Inhale 2 puffs into the lungs in the morning and at bedtime. 02/02/23   [name], MD  celecoxib (CELEBREX) 200 MG capsule Take 1 capsule (200 mg total) by mouth 2 (two) times daily as needed. Home med. 11/03/22   [name], MD  cetirizine (ZYRTEC) 10 MG tablet TAKE 1 TABLET BY MOUTH AT BEDTIME Patient taking differently: Take 10 mg by mouth at bedtime. 03/12/23   Malva Limes, MD  ELIQUIS 5 MG TABS tablet TAKE 1 TABLET BY MOUTH TWICE A DAY Patient taking differently: Take 5 mg by mouth 2 (two) times daily. 07/30/22   Malva Limes, MD  glipiZIDE (GLUCOTROL) 5 MG tablet Take 1 tablet (5 mg total) by mouth daily as needed (sugar over 300). 03/16/23   Malva Limes, MD  glucose blood (ACCU-CHEK GUIDE) test strip Use as instructed to check sugar daily for type 2 diabetes. 03/03/21   Malva Limes, MD  isosorbide mononitrate (IMDUR) 60 MG 24 hr tablet Take 1-2 tablets (60-120 mg total) by mouth daily. Home med. 11/03/22   [name], MD  lisinopril (ZESTRIL) 20 MG tablet Take 20 mg by mouth daily. 03/17/23   [provider]  meclizine (ANTIVERT) 25 MG tablet TAKE ONE TABLET BY THREE TIMES A DAY AS NEEDED FOR DIZZINESS 08/13/22   Malva Limes, MD  metFORMIN (GLUCOPHAGE-XR) 500 MG 24 hr tablet TAKE 1 TABLET BY MOUTH TWICE A DAY Patient taking differently: Take 500 mg by mouth 2 (two) times daily with a meal. 10/05/22   Malva Limes, MD  methocarbamol (ROBAXIN) 500 MG tablet Take 500 mg by mouth every 8 (eight) hours as needed for muscle spasms. Take 1 tablet by mouth every hour as needed for muscle spasms    [name], Demetrios Isaacs, MD  metoprolol succinate (TOPROL-XL) 100 MG 24 hr tablet Take 100 mg by mouth  daily. 03/17/23   [provider]  naloxone [facility]) nasal spray 4 mg/0.1 mL 1 spray into nostril x1 and may repeat every 2-3 minutes until patient is responsive or EMS arrives 08/21/20  Malva Limes, MD  nitroGLYCERIN (NITROSTAT) 0.4 MG SL tablet Place 1 tablet (0.4 mg total) under the tongue every 5 (five) minutes as needed for chest pain. 09/10/22   Alfredia Ferguson, PA-C  omega-3 acid ethyl esters (LOVAZA) 1 g capsule Take 4 capsules (4 g total) by mouth daily. 09/10/22   Drubel, Lillia Abed, PA-C  omeprazole (PRILOSEC) 40 MG capsule TAKE 1 CAPSULE BY MOUTH 2 TIMES DAILY 30 MINUTES BEFORE BREAKFAST AND DINNER Patient taking differently: Take 40 mg by mouth 2 (two) times daily. 07/15/22   Malva Limes, MD  oxyCODONE-acetaminophen (PERCOCET) 10-325 MG tablet TAKE 1 TABLET BY MOUTH EVERY 4 HOURS AS NEEDED FOR PAIN 04/20/23   Malva Limes, MD  ranolazine (RANEXA) 1000 MG SR tablet Take 1 tablet (1,000 mg total) by mouth 2 (two) times daily. 02/22/23   Arnetha Courser, MD  torsemide (DEMADEX) 100 MG tablet Take 0.5 tablets (50 mg total) by mouth daily. 09/10/22   Alfredia Ferguson, PA-C  traZODone (DESYREL) 150 MG tablet TAKE 1 TABLET BY MOUTH AT BEDTIME Patient taking differently: Take 150 mg by mouth at bedtime. 11/24/22   Malva Limes, MD  venlafaxine XR (EFFEXOR-XR) 75 MG 24 hr capsule TAKE 1 CAPSULE BY MOUTH DAILY WITH BREAKFAST Patient taking differently: Take 75 mg by mouth daily with breakfast. 02/23/23   Malva Limes, MD  zolpidem (AMBIEN) 5 MG tablet Take 1 tablet (5 mg total) by mouth at bedtime as needed for up to 3 days for sleep. SNF use only.  Refills per SNF MD 04/05/23 04/08/23  Tresa Moore, MD   Physical Exam: Vitals:   04/27/23 1500 04/27/23 1700 04/27/23 1730 04/27/23 1800  BP: (!) 182/110 (!) 152/84 (!) 175/89   Pulse: 65 73    Resp: 16     Temp:    (!) 97.5 F (36.4 C)  TempSrc:      SpO2: 98% 96% 95%   Weight:       Constitutional: appears  age-appropriate, frail Eyes: PERRL, lids and conjunctivae normal ENMT: Mucous membranes are moist. Posterior pharynx clear of any exudate or lesions. Age-appropriate dentition. Hearing appropriate Neck: normal, supple, no masses, no thyromegaly Respiratory: clear to auscultation bilaterally, no wheezing, no crackles. Normal respiratory effort. No accessory muscle use.  Cardiovascular: Regular rate and rhythm, no murmurs / rubs / gallops. No extremity edema. 2+ pedal pulses. No carotid bruits.  Abdomen: Obese abdomen, no tenderness, no masses palpated, no hepatosplenomegaly. Bowel sounds positive.  Musculoskeletal: no clubbing / cyanosis. No joint deformity upper and lower extremities. Good ROM, no contractures, no atrophy. Normal muscle tone.  Skin: no rashes, lesions, ulcers. No induration Neurologic: Sensation intact. Strength 5/5 in all 4.  Psychiatric: Normal judgment and insight. Alert and oriented x 3. Normal mood.   EKG: independently reviewed, showing atrial fibrillation with rate of 71, QTc 445  Chest x-ray on Admission: I personally reviewed and I agree with radiologist reading as below.  CT ABDOMEN PELVIS W CONTRAST  Result Date: 04/27/2023 CLINICAL DATA:  Fall at home today. Blunt abdominal trauma. Abdominal pain. On Eliquis. EXAM: CT ABDOMEN AND PELVIS WITH CONTRAST TECHNIQUE: Multidetector CT imaging of the abdomen and pelvis was performed using the standard protocol following bolus administration of intravenous contrast. RADIATION DOSE REDUCTION: This exam was performed according to the departmental dose-optimization program which includes automated exposure control, adjustment of the mA and/or kV according to patient size and/or use of iterative reconstruction technique. CONTRAST:  OMNIPAQUE IOHEXOL 300 MG/ML  SOLN COMPARISON:  01/27/2023 FINDINGS: Lower chest: New multifocal areas of airspace opacity are seen in both lower lobes. New tiny bilateral pleural effusions also seen  bilaterally. Hepatobiliary: No hepatic laceration or mass identified. Prior cholecystectomy. No evidence of biliary obstruction. Pancreas: No parenchymal laceration, mass, or inflammatory changes identified. Spleen: No evidence of splenic laceration. Adrenal/Urinary Tract: No hemorrhage or parenchymal lacerations identified. No evidence of suspicious renal masses or hydronephrosis. Unremarkable unopacified urinary bladder. Stomach/Bowel: Unopacified bowel loops are unremarkable in appearance. No evidence of hemoperitoneum. Vascular/Lymphatic: No evidence of abdominal aortic injury or retroperitoneal hemorrhage. Aortic atherosclerotic calcification incidentally noted. No pathologically enlarged lymph nodes identified. Reproductive:  No mass or other significant abnormality identified. Other:  Stable small left inguinal hernia, which contains only fat. Musculoskeletal: No acute fractures or suspicious bone lesions identified. IMPRESSION: No evidence of visceral injury, hemorrhage, or other acute findings within the abdomen or pelvis. New multifocal airspace opacities in both lower lobes, and tiny bilateral pleural effusions. Differential diagnosis includes pulmonary contusion, aspiration, and pneumonia. Stable small left inguinal hernia, which contains only fat. Electronically Signed   By: Danae Orleans M.D.   On: 04/27/2023 16:53   DG Chest 2 View  Result Date: 04/27/2023 CLINICAL DATA:  Weakness EXAM: CHEST - 2 VIEW COMPARISON:  Chest x-ray dated March 12, 2023 FINDINGS: Cardiac and mediastinal contours are unchanged status post median sternotomy. Increased diffuse interstitial opacities. Trace bilateral pleural effusions. No evidence of pneumothorax. IMPRESSION: 1. Increased diffuse interstitial opacities, consistent with worsened pulmonary edema. 2. Trace bilateral pleural effusions. Electronically Signed   By: Allegra Lai M.D.   On: 04/27/2023 16:50   CT HEAD WO CONTRAST ( )  Result Date:  04/27/2023 CLINICAL DATA:  Mental status change, unknown cause.  Fall. EXAM: CT HEAD WITHOUT CONTRAST TECHNIQUE: Contiguous axial images were obtained from the base of the skull through the vertex without intravenous contrast. RADIATION DOSE REDUCTION: This exam was performed according to the departmental dose-optimization program which includes automated exposure control, adjustment of the mA and/or kV according to patient size and/or use of iterative reconstruction technique. COMPARISON:  Head CT 03/27/2023 and MRI 03/28/2023 FINDINGS: Brain: There is no evidence of an acute infarct, intracranial hemorrhage, mass, midline shift, or extra-axial fluid collection. Mild cerebral atrophy is within normal limits for age. Cerebral white matter hypodensities are unchanged and nonspecific but compatible with mild chronic small vessel ischemic disease. Vascular: No hyperdense vessel. Skull: No acute fracture or suspicious osseous lesion. Sinuses/Orbits: Mild mucosal thickening in the paranasal sinuses. No significant mastoid fluid. Unremarkable orbits. Other: None. IMPRESSION: 1. No evidence of acute intracranial abnormality. 2. Mild chronic small vessel ischemic disease. Electronically Signed   By: Sebastian Ache M.D.   On: 04/27/2023 16:35    Labs on Admission: I have personally reviewed following labs  CBC: Recent Labs  Lab 04/27/23 1201  WBC 6.9  HGB 11.7*  HCT 38.3*  MCV 88.5  PLT 118*   Basic Metabolic Panel: Recent Labs  Lab 04/27/23 1201  NA 140  K 3.6  CL 110  CO2 21*  GLUCOSE 112*  BUN 16  CREATININE 0.89  CALCIUM 7.6*   GFR: Estimated Creatinine Clearance: 80.1 mL/min (by C-G formula based on SCr of 0.89 mg/dL).  Liver Function Tests: Recent Labs  Lab 04/27/23 1507  AST 18  ALT 18  ALKPHOS 53  BILITOT 0.8  PROT 6.0*  ALBUMIN 3.0*   Recent Labs  Lab 04/27/23 1507  LIPASE 21   Urine analysis:  Component Value Date/Time   COLORURINE YELLOW (A) 04/27/2023 1507    APPEARANCEUR CLEAR (A) 04/27/2023 1507   APPEARANCEUR Clear 05/25/2019 1554   LABSPEC 1.025 04/27/2023 1507   LABSPEC 1.012 03/07/2015 2143   PHURINE 5.0 04/27/2023 1507   GLUCOSEU >=500 (A) 04/27/2023 1507   GLUCOSEU Negative 03/07/2015 2143   HGBUR NEGATIVE 04/27/2023 1507   BILIRUBINUR NEGATIVE 04/27/2023 1507   BILIRUBINUR Negative 05/25/2019 1554   BILIRUBINUR Negative 03/07/2015 2143   KETONESUR 5 (A) 04/27/2023 1507   PROTEINUR 30 (A) 04/27/2023 1507   UROBILINOGEN 0.2 03/26/2017 0907   NITRITE NEGATIVE 04/27/2023 1507   LEUKOCYTESUR NEGATIVE 04/27/2023 1507   LEUKOCYTESUR Negative 03/07/2015 2143   This document was prepared using Dragon Voice Recognition software and may include unintentional dictation errors.  Dr. Sedalia Muta [facility]  If 7PM-7AM, please contact overnight-coverage provider If 7AM-7PM, please contact day attending provider [email or web address]  04/27/2023, 9:26 PM

## 2023-04-27 NOTE — Hospital Course (Signed)
[name] is a 67 year old male with history of hyperlipidemia, non-insulin-dependent diabetes mellitus type 2, hypertension, CAD status post PCI, paroxysmal atrial fibrillation on Eliquis, heart failure preserved ejection fraction, who presents to the emergency department for chief concerns of generalized weakness.  Vitals in the ED showed temperature of 97.8, respiration rate of 18, heart rate of 71, blood pressure 125/101, SpO2 of 94% on 3 L nasal cannula.  Serum sodium is 140, potassium 3.6, chloride 110, bicarb 21, BUN of 16, serum creatinine of 0.89, EGFR greater than 60, nonfasting blood glucose 112, WBC 6.9, hemoglobin 11.7, platelets of 118.  BNP was elevated at 1093.5, high sensitive troponin was 17, lactic acid was 1.7 on repeat was 1.3.  UA was negative for leukocytes and nitrates.  ED treatment: Fentanyl 50 mcg one-time dose, furosemide 80 mg IV one-time dose, Dilaudid 0.5 mg IV one-time dose, ondansetron 4 mg IV one-time dose, Protonix bolus and GGT ordered.

## 2023-04-27 NOTE — ED Provider Notes (Signed)
-----------------------------------------   3:57 PM on 04/27/2023 -----------------------------------------  Blood pressure (!) 183/97, pulse 77, temperature 97.8 F (36.6 C), temperature source Oral, resp. rate 14, weight 81.6 kg, SpO2 100 %.  Assuming care from Dr. Erma Heritage.  In short, Lawrence [name] is a 67 y.o. male with a chief complaint of Fall and Weakness .  Refer to the original H&P for additional details.  The current plan of care is to follow-up CT results for weakness and abdominal pain, concern for GI bleeding given drop in hemoglobin.  ----------------------------------------- 5:09 PM on 04/27/2023 ----------------------------------------- CT head is negative for acute process, CT abdomen/pelvis negative for acute intra-abdominal process but does note multifocal infiltrates in the bilateral lower lobes with small effusions.  Infection considered but seems less likely given no fever or leukocytosis.  I would favor CHF exacerbation, especially given significant elevation in his BNP.  We will diurese with IV Lasix, patient will require admission for this as well as GI bleed.  Case discussed with hospitalist for admission.    [name], MD 04/27/23 [phone]

## 2023-04-27 NOTE — Assessment & Plan Note (Addendum)
Resumed home amiodarone 200 mg p.o. twice daily Home Eliquis not resumed on admission due to concerns of upper GI bleed AM team to resume home Eliquis when the benefits outweigh the risk

## 2023-04-27 NOTE — ED Triage Notes (Signed)
Pt arrived via EMS from home. Pt sts that he was not able to get off of the couch this AM and when he did he fell to the floor. Pt sts that he recently got out of rehab on 04/20/23. Pt sts that he was at [facility].

## 2023-04-28 ENCOUNTER — Encounter: Payer: Self-pay | Admitting: Internal Medicine

## 2023-04-28 ENCOUNTER — Other Ambulatory Visit: Payer: Self-pay

## 2023-04-28 DIAGNOSIS — D649 Anemia, unspecified: Secondary | ICD-10-CM | POA: Diagnosis not present

## 2023-04-28 DIAGNOSIS — K922 Gastrointestinal hemorrhage, unspecified: Secondary | ICD-10-CM | POA: Diagnosis not present

## 2023-04-28 DIAGNOSIS — I509 Heart failure, unspecified: Secondary | ICD-10-CM | POA: Diagnosis not present

## 2023-04-28 LAB — BASIC METABOLIC PANEL
Anion gap: 9 (ref 5–15)
BUN: 16 mg/dL (ref 8–23)
CO2: 30 mmol/L (ref 22–32)
Calcium: 7.9 mg/dL — ABNORMAL LOW (ref 8.9–10.3)
Chloride: 102 mmol/L (ref 98–111)
Creatinine, Ser: 0.88 mg/dL (ref 0.61–1.24)
GFR, Estimated: >60 mL/min (ref >60)
Glucose, Bld: 88 mg/dL (ref 70–99)
Potassium: 3.4 mmol/L — ABNORMAL LOW (ref 3.5–5.1)
Sodium: 141 mmol/L (ref 135–145)

## 2023-04-28 LAB — CBC
HCT: 37.7 % — ABNORMAL LOW (ref 39.0–52.0)
Hemoglobin: 11.7 g/dL — ABNORMAL LOW (ref 13.0–17.0)
MCH: 27.1 pg (ref 26.0–34.0)
MCHC: 31 g/dL (ref 30.0–36.0)
MCV: 87.5 fL (ref 80.0–100.0)
Platelets: 135 10*3/uL — ABNORMAL LOW (ref 150–400)
RBC: 4.31 MIL/uL (ref 4.22–5.81)
RDW: 16.6 % — ABNORMAL HIGH (ref 11.5–15.5)
WBC: 6.3 10*3/uL (ref 4.0–10.5)
nRBC: 0 % (ref 0.0–0.2)

## 2023-04-28 LAB — CBG MONITORING, ED
Glucose-Capillary: 102 mg/dL — ABNORMAL HIGH (ref 70–99)
Glucose-Capillary: 179 mg/dL — ABNORMAL HIGH (ref 70–99)
Glucose-Capillary: 103 mg/dL — ABNORMAL HIGH (ref 70–99)
Glucose-Capillary: 105 mg/dL — ABNORMAL HIGH (ref 70–99)

## 2023-04-28 MED ORDER — ALPRAZOLAM 0.25 MG PO TABS
0.2500 mg | ORAL_TABLET | Freq: Three times a day (TID) | ORAL | Status: DC | PRN
  Administered 2023-04-28 – 2023-05-04 (×11): 0.25 mg via ORAL
  Filled 2023-04-28 (×11): qty 1

## 2023-04-28 MED ORDER — FUROSEMIDE 10 MG/ML IJ SOLN
40.0000 mg | Freq: Two times a day (BID) | INTRAMUSCULAR | Status: DC
  Administered 2023-04-28 – 2023-04-29 (×3): 40 mg via INTRAVENOUS
  Filled 2023-04-28 (×3): qty 4

## 2023-04-28 NOTE — Progress Notes (Signed)
[facility]  - [place] [facility]   PATIENT NAME: Lawrence [name]    MR#:  [id number]  DATE OF BIRTH:  June 30, 1956  SUBJECTIVE:  no family at bedside patient not the best historian. Tells me came in because of weakness. He's from rehab facility. Apparently was not able to get around started feeling short of breath brought to the emergency room. Patient himself does not know if he isn't passing any blood however tells me staff told him he is having dark stools.    VITALS:  Blood pressure 118/80, pulse 70, temperature 98.4 F (36.9 C), temperature source Oral, resp. rate 13, height 5\' 7"  (1.702 m), weight 81.6 kg, SpO2 97 %.  PHYSICAL EXAMINATION:   GENERAL:  67 y.o.-year-old patient with no acute distress.  LUNGS decrease breath sounds bilaterally, no wheezing CARDIOVASCULAR: S1, S2 normal. No murmur   ABDOMEN: Soft, nontender, nondistended. Bowel sounds present.  EXTREMITIES: No  edema b/l.    NEUROLOGIC: nonfocal  patient is alert and awake SKIN: No obvious rash, lesion, or ulcer.   LABORATORY PANEL:  CBC Recent Labs  Lab 04/28/23 0640  WBC 6.3  HGB 11.7*  HCT 37.7*  PLT 135*    Chemistries  Recent Labs  Lab 04/27/23 1507 04/28/23 0640  NA  --  141  K  --  3.4*  CL  --  102  CO2  --  30  GLUCOSE  --  88  BUN  --  16  CREATININE  --  0.88  CALCIUM  --  7.9*  AST 18  --   ALT 18  --   ALKPHOS 53  --   BILITOT 0.8  --    Cardiac Enzymes No results for input(s): "TROPONINI" in the last 168 hours. RADIOLOGY:  CT ABDOMEN PELVIS W CONTRAST  Result Date: 04/27/2023 CLINICAL DATA:  Fall at home today. Blunt abdominal trauma. Abdominal pain. On Eliquis. EXAM: CT ABDOMEN AND PELVIS WITH CONTRAST TECHNIQUE: Multidetector CT imaging of the abdomen and pelvis was performed using the standard protocol following bolus administration of intravenous contrast. RADIATION DOSE REDUCTION: This exam was performed according to the departmental dose-optimization  program which includes automated exposure control, adjustment of the mA and/or kV according to patient size and/or use of iterative reconstruction technique. CONTRAST:  OMNIPAQUE IOHEXOL 300 MG/ML  SOLN COMPARISON:  01/27/2023 FINDINGS: Lower chest: New multifocal areas of airspace opacity are seen in both lower lobes. New tiny bilateral pleural effusions also seen bilaterally. Hepatobiliary: No hepatic laceration or mass identified. Prior cholecystectomy. No evidence of biliary obstruction. Pancreas: No parenchymal laceration, mass, or inflammatory changes identified. Spleen: No evidence of splenic laceration. Adrenal/Urinary Tract: No hemorrhage or parenchymal lacerations identified. No evidence of suspicious renal masses or hydronephrosis. Unremarkable unopacified urinary bladder. Stomach/Bowel: Unopacified bowel loops are unremarkable in appearance. No evidence of hemoperitoneum. Vascular/Lymphatic: No evidence of abdominal aortic injury or retroperitoneal hemorrhage. Aortic atherosclerotic calcification incidentally noted. No pathologically enlarged lymph nodes identified. Reproductive:  No mass or other significant abnormality identified. Other:  Stable small left inguinal hernia, which contains only fat. Musculoskeletal: No acute fractures or suspicious bone lesions identified. IMPRESSION: No evidence of visceral injury, hemorrhage, or other acute findings within the abdomen or pelvis. New multifocal airspace opacities in both lower lobes, and tiny bilateral pleural effusions. Differential diagnosis includes pulmonary contusion, aspiration, and pneumonia. Stable small left inguinal hernia, which contains only fat. Electronically Signed   By: Danae Orleans M.D.   On: 04/27/2023 16:53  DG Chest 2 View  Result Date: 04/27/2023 CLINICAL DATA:  Weakness EXAM: CHEST - 2 VIEW COMPARISON:  Chest x-ray dated March 12, 2023 FINDINGS: Cardiac and mediastinal contours are unchanged status post median  sternotomy. Increased diffuse interstitial opacities. Trace bilateral pleural effusions. No evidence of pneumothorax. IMPRESSION: 1. Increased diffuse interstitial opacities, consistent with worsened pulmonary edema. 2. Trace bilateral pleural effusions. Electronically Signed   By: Allegra Lai M.D.   On: 04/27/2023 16:50   CT HEAD WO CONTRAST ( )  Result Date: 04/27/2023 CLINICAL DATA:  Mental status change, unknown cause.  Fall. EXAM: CT HEAD WITHOUT CONTRAST TECHNIQUE: Contiguous axial images were obtained from the base of the skull through the vertex without intravenous contrast. RADIATION DOSE REDUCTION: This exam was performed according to the departmental dose-optimization program which includes automated exposure control, adjustment of the mA and/or kV according to patient size and/or use of iterative reconstruction technique. COMPARISON:  Head CT 03/27/2023 and MRI 03/28/2023 FINDINGS: Brain: There is no evidence of an acute infarct, intracranial hemorrhage, mass, midline shift, or extra-axial fluid collection. Mild cerebral atrophy is within normal limits for age. Cerebral white matter hypodensities are unchanged and nonspecific but compatible with mild chronic small vessel ischemic disease. Vascular: No hyperdense vessel. Skull: No acute fracture or suspicious osseous lesion. Sinuses/Orbits: Mild mucosal thickening in the paranasal sinuses. No significant mastoid fluid. Unremarkable orbits. Other: None. IMPRESSION: 1. No evidence of acute intracranial abnormality. 2. Mild chronic small vessel ischemic disease. Electronically Signed   By: Sebastian Ache M.D.   On: 04/27/2023 16:35    Assessment and Plan  Lawrence [name] is a 67 y.o. male with past medical history of CAD status post STEMI, chronic angina, heart failure with preserved EF, A-fib, COPD, here with epigastric discomfort and generalized weakness.  The patient states that today, he woke up, and essentially is unable to support himself  due to weakness.   BNP was elevated at 1093.5, high sensitive troponin was 17, lactic acid was 1.7 on repeat was 1.3.   Acute on chronic congestive heart failure diastolic -- came in with nonspecific symptoms of generalized weakness not able to get around elevated BNP --  IV Lasix and monitor urine output -- patient on oxygen -- continue cardiac meds -- patient follows with [facility] cardiology  Acute on chronic anemia -- hemoglobin 14.8--15.2 -- came in with hemoglobin of 11.2 -- patient unable to give any history of dark stools -- on eliquis for a fib--- now holding -- G.I. consultation with Dr. Allegra Lai -- transfuse as needed  Paroxysmal a fib -- resumed amiodarone -- hold eliquis for now  CAD -- continue statins,ranexa -- hold aspirin and lisinopril-- soft BP  COPD -- stable  PT OT to see patient due to generalized weakness  Family communication :none today Consults :GI CODE STATUS: DNR DVT Prophylaxis :SCD Level of care: Telemetry Cardiac Status is: Inpatient Remains inpatient appropriate because: CHF, ?GI bleed    TOTAL TIME TAKING CARE OF THIS PATIENT: 35 minutes.  >50% time spent on counselling and coordination of care  Note: This dictation was prepared with Dragon dictation along with smaller phrase technology. Any transcriptional errors that result from this process are unintentional.  Enedina Finner M.D    [facility]   CC: Primary care physician; Malva Limes, MD

## 2023-04-28 NOTE — ED Notes (Signed)
Patient requesting another "Xanax."  Informed patient that he had one less than 4 hours ago and will need to wait.  States "I can take them 3 times a day."  Pt educated on importance of waiting at least 8 hours in between doses.  Understanding voiced

## 2023-04-28 NOTE — Consult Note (Signed)
Arlyss Repress, MD [address]  [address]  [place], [place] [address]  Main: [phone]  Fax: [phone] Pager: [phone]   Consultation  Referring Provider:     No ref. provider found Primary Care Physician:  Malva Limes, MD Primary Gastroenterologist: Gentry Fitz        Reason for Consultation: Symptomatic anemia  Date of Admission:  04/27/2023 Date of Consultation:  04/28/2023         HPI:   Lawrence [name] is a 67 y.o. male history of diabetes, hypertension, coronary artery disease, history of MI s/p PCI, paroxysmal A-fib on Eliquis, hyperlipidemia, diastolic heart failure presented with generalized weakness.  Patient was hemodynamically stable in the ER, he was found to have hemoglobin of 11.7, elevated BNP 1093, normal lactic acid.  Chest x-ray revealed pulmonary venous congestion and bilateral pleural effusions.  He is currently being treated for CHF exacerbation.  His hemoglobin dropped from 15.6 since 04/01/23 to 11.7 on current admission.  Also, with thrombocytopenia, normal MCV.  Patient does have history of iron deficiency anemia.  Nursing staff reported dark stools, his BUN/creatinine was elevated about a month ago.  He underwent upper endoscopy and colonoscopy in 2019. Patient had a fall at nursing home, had blunt abdominal trauma, complaining of abdominal pain Underwent CT abdomen pelvis with contrast which did not reveal any acute intra-abdominal pathology GI is consulted for new onset of symptomatic anemia   NSAIDs: None  Antiplts/Anticoagulants/Anti thrombotics: Eliquis, last dose on 6:11 AM  GI Procedures:  EGD and colonoscopy 04/01/2018 Surgical anastomosis at the gastroesophageal junction Gastric erosions Duodenal erosions  Adequate bowel prep Several diminutive polyps in the colon were resected  A.  STOMACH, ANTRUM AND BODY; COLD BIOPSY: - ANTRAL AND OXYNTIC MUCOSA WITH SUPERFICIAL VASCULAR CONGESTION. - OXYNTIC MUCOSA WITH FEATURES OF  PROTON PUMP INHIBITOR EFFECT. - NEGATIVE FOR H. PYLORI, DYSPLASIA AND MALIGNANCY.  B.  COLON POLYP, TRANSVERSE; COLD BIOPSY: - NO PATHOLOGIC CHANGE. - MULTIPLE DEEPER LEVELS WERE EXAMINED.  C.  COLON POLYP, TRANSVERSE; COLD BIOPSY: - TUBULAR ADENOMA. - NEGATIVE FOR HIGH-GRADE DYSPLASIA AND MALIGNANCY.  D.  COLON POLYP, DESCENDING; COLD BIOPSY/HOT SNARE: - POLYPOID COLONIC MUCOSA WITHOUT DYSPLASIA AND MALIGNANCY (3). - MULTIPLE DEEPER LEVELS WERE EXAMINED.  E.  RECTUM POLYP X2; COLD BIOPSY: - HYPERPLASTIC POLYPS (2). - NEGATIVE FOR DYSPLASIA AND MALIGNANCY.    Past Medical History:  Diagnosis Date   A-fib (HCC)    Anemia    Anginal pain (HCC)    Anxiety    Arthritis    Asthma    CAD (coronary artery disease)    a. 2002 CABGx2 (LIMA->LAD, VG->VG->OM1);  b. 09/2012 DES->OM;  c. 03/2015 PTCA of LAD [facility]) in setting of atretic LIMA; d. 05/2015 [facility]): nonobs dzs; e. 06/2015 Cath ([facility]): LM nl, LAD 45p/d ISR, 50d, D1/2 small, LCX 50p/d ISR, OM1 70ost, 30 ISR, VG->OM1 50ost, 52m, LIMA->LAD 99p/d - atretic, RCA dom, nl; f.cath 10/16: 40-50%(FFR 0.90) pLAD, 75% (FFR 0.77) mLAD s/p PCI/DES, oRCA 40% (FFR0.95)   Cancer (HCC)    SKIN CANCER ON BACK   Celiac disease    Chronic diastolic CHF (congestive heart failure) (HCC)    a. 06/2009 Echo: EF 60-65%, Gr 1 DD, triv AI, mildly dil LA, nl RV.   COPD (chronic obstructive pulmonary disease) (HCC)    a. Chronic bronchitis and emphysema.   DDD (degenerative disc disease), lumbar    Diverticulosis    Dysrhythmia    Essential hypertension  GERD (gastroesophageal reflux disease)    History of hiatal hernia    History of kidney stones    H/O   History of tobacco abuse    a. Quit 2014.   Myocardial infarction [facility]) 2002   4 STENTS   Pancreatitis    PSVT (paroxysmal supraventricular tachycardia)    a. 10/2012 Noted on Zio Patch.   Sleep apnea    LOST CORD TO CPAP -ONLY 02 @ BEDTIME   Tubular adenoma of colon    Type II diabetes  mellitus (HCC)     Past Surgical History:  Procedure Laterality Date   BYPASS GRAFT     CARDIAC CATHETERIZATION N/A 07/12/2015   rocedure: Left Heart Cath and Cors/Grafts Angiography;  Surgeon: Lyn Records, MD;  Location[facility] INVASIVE CV LAB;  Service: Cardiovascular;  Laterality: N/A;   CARDIAC CATHETERIZATION Right 10/07/2015   Procedure: Left Heart Cath and Cors/Grafts Angiography;  Surgeon: Laurier Nancy, MD;  Location: [facility] INVASIVE CV LAB;  Service: Cardiovascular;  Laterality: Right;   CARDIAC CATHETERIZATION N/A 04/06/2016   Procedure: Left Heart Cath and Coronary Angiography;  Surgeon: Alwyn Pea, MD;  Location: [facility] INVASIVE CV LAB;  Service: Cardiovascular;  Laterality: N/A;   CARDIAC CATHETERIZATION  04/06/2016   Procedure: Bypass Graft Angiography;  Surgeon: Alwyn Pea, MD;  Location: [facility] INVASIVE CV LAB;  Service: Cardiovascular;;   CARDIAC CATHETERIZATION N/A 11/02/2016   Procedure: Left Heart Cath and Cors/Grafts Angiography and possible PCI;  Surgeon: Alwyn Pea, MD;  Location: [facility] INVASIVE CV LAB;  Service: Cardiovascular;  Laterality: N/A;   CARDIAC CATHETERIZATION N/A 11/02/2016   Procedure: Coronary Stent Intervention;  Surgeon: Alwyn Pea, MD;  Location: [facility] INVASIVE CV LAB;  Service: Cardiovascular;  Laterality: N/A;   CARDIOVERSION N/A 06/29/2022   Procedure: CARDIOVERSION;  Surgeon: Lamar Blinks, MD;  Location: [facility] ORS;  Service: Cardiovascular;  Laterality: N/A;   CHOLECYSTECTOMY     CIRCUMCISION N/A 06/09/2019   Procedure: CIRCUMCISION ADULT;  Surgeon: Sondra Come, MD;  Location: [facility] ORS;  Service: Urology;  Laterality: N/A;   COLONOSCOPY WITH PROPOFOL N/A 04/01/2018   Procedure: COLONOSCOPY WITH PROPOFOL;  Surgeon: Scot Jun, MD;  Location: [facility] ENDOSCOPY;  Service: Endoscopy;  Laterality: N/A;   ESOPHAGEAL DILATION     ESOPHAGOGASTRODUODENOSCOPY (EGD) WITH PROPOFOL N/A 04/01/2018   Procedure:  ESOPHAGOGASTRODUODENOSCOPY (EGD) WITH PROPOFOL;  Surgeon: Scot Jun, MD;  Location: [facility] ENDOSCOPY;  Service: Endoscopy;  Laterality: N/A;   LEFT HEART CATH AND CORS/GRAFTS ANGIOGRAPHY N/A 06/12/2019   Procedure: LEFT HEART CATH AND CORS/GRAFTS ANGIOGRAPHY;  Surgeon: Dalia Heading, MD;  Location: [facility] INVASIVE CV LAB;  Service: Cardiovascular;  Laterality: N/A;   LEFT HEART CATH AND CORS/GRAFTS ANGIOGRAPHY N/A 03/11/2020   Procedure: LEFT HEART CATH AND CORS/GRAFTS ANGIOGRAPHY;  Surgeon: Marcina Millard, MD;  Location: [facility] INVASIVE CV LAB;  Service: Cardiovascular;  Laterality: N/A;   LEFT HEART CATH AND CORS/GRAFTS ANGIOGRAPHY N/A 05/01/2021   Procedure: LEFT HEART CATH AND CORS/GRAFTS ANGIOGRAPHY;  Surgeon: Lamar Blinks, MD;  Location: [facility] INVASIVE CV LAB;  Service: Cardiovascular;  Laterality: N/A;   LEFT HEART CATH AND CORS/GRAFTS ANGIOGRAPHY N/A 11/02/2022   Procedure: LEFT HEART CATH AND CORS/GRAFTS ANGIOGRAPHY;  Surgeon: Alwyn Pea, MD;  Location: [facility] INVASIVE CV LAB;  Service: Cardiovascular;  Laterality: N/A;   TONSILLECTOMY     VASCULAR SURGERY       Current Facility-Administered Medications:    acetaminophen (TYLENOL) tablet 650 mg, 650 mg,  Oral, Q6H PRN **OR** acetaminophen (TYLENOL) suppository 650 mg, 650 mg, Rectal, Q6H PRN, Cox, Amy N, DO   albuterol (PROVENTIL) (2.5 MG/3ML) 0.083% nebulizer solution 3 mL, 3 mL, Inhalation, Q6H PRN, Cox, Amy N, DO   ALPRAZolam Prudy Feeler) tablet 0.25 mg, 0.25 mg, Oral, TID PRN, Mansy, Jan A, MD, 0.25 mg at 04/28/23 1947   amiodarone (PACERONE) tablet 200 mg, 200 mg, Oral, BID, Cox, Amy N, DO, 200 mg at 04/28/23 2301   arformoterol (BROVANA) nebulizer solution 15 mcg, 15 mcg, Nebulization, BID, 15 mcg at 04/28/23 1950 **AND** umeclidinium bromide (INCRUSE ELLIPTA) 62.5 MCG/ACT 1 puff, 1 puff, Inhalation, Daily, Jaynie Bream, RPH, 1 puff at 04/28/23 1012   atorvastatin (LIPITOR) tablet 80 mg, 80 mg, Oral, Daily, Cox,  Amy N, DO, 80 mg at 04/28/23 0932   budesonide (PULMICORT) nebulizer solution 0.5 mg, 2 mL, Inhalation, BID, Jaynie Bream, RPH, 0.5 mg at 04/28/23 1950   furosemide (LASIX) injection 40 mg, 40 mg, Intravenous, BID, Enedina Finner, MD, 40 mg at 04/28/23 1634   hydrALAZINE (APRESOLINE) injection 5 mg, 5 mg, Intravenous, Q8H PRN, Cox, Amy N, DO   insulin aspart (novoLOG) injection 0-15 Units, 0-15 Units, Subcutaneous, TID WC, Cox, Amy N, DO, 3 Units at 04/28/23 1247   insulin aspart (novoLOG) injection 0-5 Units, 0-5 Units, Subcutaneous, QHS, Cox, Amy N, DO   meclizine (ANTIVERT) tablet 25 mg, 25 mg, Oral, TID PRN, Cox, Amy N, DO   melatonin tablet 5 mg, 5 mg, Oral, QHS PRN, Cox, Amy N, DO   nitroGLYCERIN (NITROSTAT) SL tablet 0.4 mg, 0.4 mg, Sublingual, Q5 min PRN, Cox, Amy N, DO   ondansetron (ZOFRAN) tablet 4 mg, 4 mg, Oral, Q6H PRN **OR** ondansetron (ZOFRAN) injection 4 mg, 4 mg, Intravenous, Q6H PRN, Cox, Amy N, DO   oxyCODONE-acetaminophen (PERCOCET/ROXICET) 5-325 MG per tablet 1 tablet, 1 tablet, Oral, Q4H PRN, 1 tablet at 04/28/23 1809 **AND** oxyCODONE (Oxy IR/ROXICODONE) immediate release tablet 5 mg, 5 mg, Oral, Q4H PRN, Jaynie Bream, RPH   [START ON 05/01/2023] pantoprazole (PROTONIX) injection 40 mg, 40 mg, Intravenous, Q12H, Cox, Amy N, DO   pantoprozole (PROTONIX) 80 mg /NS 100 mL infusion, 8 mg/hr, Intravenous, Continuous, Cox, Amy N, DO, Last Rate: 10 mL/hr at 04/28/23 1923, 8 mg/hr at 04/28/23 1923   ranolazine (RANEXA) 12 hr tablet 1,000 mg, 1,000 mg, Oral, BID, Cox, Amy N, DO, 1,000 mg at 04/28/23 2301   senna-docusate (Senokot-S) tablet 1 tablet, 1 tablet, Oral, QHS PRN, Cox, Amy N, DO   traZODone (DESYREL) tablet 150 mg, 150 mg, Oral, QHS, Cox, Amy N, DO, 150 mg at 04/28/23 2300   venlafaxine XR (EFFEXOR-XR) 24 hr capsule 75 mg, 75 mg, Oral, Q breakfast, Cox, Amy N, DO, 75 mg at 04/28/23 1012  Current Outpatient Medications:    acetaminophen (TYLENOL) 325 MG tablet,  Take 2 tablets (650 mg total) by mouth every 4 (four) hours as needed for headache or mild pain., Disp: , Rfl:    albuterol (VENTOLIN HFA) 108 (90 Base) MCG/ACT inhaler, INHALE 2 PUFFS BY MOUTH EVERY 6 HOURS AS NEEDED FOR SHORTNESS OF BREATH, Disp: 17 g, Rfl: 5   allopurinol (ZYLOPRIM) 300 MG tablet, TAKE 1 TABLET BY MOUTH TWICE A DAY, Disp: 180 tablet, Rfl: 4   ALPRAZolam (XANAX) 1 MG tablet, Take 1 mg by mouth 3 (three) times daily as needed., Disp: , Rfl:    amiodarone (PACERONE) 200 MG tablet, Take 200 mg by mouth 2 (two) times daily.,  Disp: , Rfl:    aspirin 81 MG chewable tablet, Chew 81 mg by mouth daily. Swallows whole, Disp: , Rfl:    atorvastatin (LIPITOR) 80 MG tablet, Take 1 tablet (80 mg total) by mouth daily. Please schedule office visit before any future refill., Disp: 90 tablet, Rfl: 0   Budeson-Glycopyrrol-Formoterol (BREZTRI AEROSPHERE) 160-9-4.8 MCG/ACT AERO, Inhale 2 puffs into the lungs in the morning and at bedtime., Disp: 11.8 g, Rfl: 0   celecoxib (CELEBREX) 200 MG capsule, Take 1 capsule (200 mg total) by mouth 2 (two) times daily as needed. Home med., Disp: , Rfl:    cetirizine (ZYRTEC) 10 MG tablet, TAKE 1 TABLET BY MOUTH AT BEDTIME, Disp: 90 tablet, Rfl: 1   cetirizine (ZYRTEC) 5 MG tablet, Take 5 mg by mouth daily., Disp: , Rfl:    glipiZIDE (GLUCOTROL) 5 MG tablet, Take 1 tablet (5 mg total) by mouth daily as needed (sugar over 300)., Disp: 30 tablet, Rfl: 0   isosorbide mononitrate (IMDUR) 60 MG 24 hr tablet, Take 1-2 tablets (60-120 mg total) by mouth daily. Home med., Disp: , Rfl:    lisinopril (ZESTRIL) 20 MG tablet, Take 20 mg by mouth daily., Disp: , Rfl:    meclizine (ANTIVERT) 25 MG tablet, TAKE ONE TABLET BY THREE TIMES A DAY AS NEEDED FOR DIZZINESS, Disp: 30 tablet, Rfl: 5   metFORMIN (GLUCOPHAGE-XR) 500 MG 24 hr tablet, TAKE 1 TABLET BY MOUTH TWICE A DAY, Disp: 60 tablet, Rfl: 3   methocarbamol (ROBAXIN) 500 MG tablet, Take 500 mg by mouth every 8 (eight)  hours as needed for muscle spasms. Take 1 tablet by mouth every hour as needed for muscle spasms, Disp: , Rfl:    metoprolol succinate (TOPROL-XL) 100 MG 24 hr tablet, Take 100 mg by mouth daily., Disp: , Rfl:    naloxone (NARCAN) nasal spray 4 mg/0.1 mL, 1 spray into nostril x1 and may repeat every 2-3 minutes until patient is responsive or EMS arrives, Disp: 2 each, Rfl: 2   nitroGLYCERIN (NITROSTAT) 0.4 MG SL tablet, Place 1 tablet (0.4 mg total) under the tongue every 5 (five) minutes as needed for chest pain., Disp: 30 tablet, Rfl: 0   omega-3 acid ethyl esters (LOVAZA) 1 g capsule, Take 4 capsules (4 g total) by mouth daily., Disp: 120 capsule, Rfl: 3   omeprazole (PRILOSEC) 40 MG capsule, TAKE 1 CAPSULE BY MOUTH 2 TIMES DAILY 30 MINUTES BEFORE BREAKFAST AND DINNER, Disp: 180 capsule, Rfl: 4   oxyCODONE-acetaminophen (PERCOCET) 10-325 MG tablet, TAKE 1 TABLET BY MOUTH EVERY 4 HOURS AS NEEDED FOR PAIN, Disp: 180 tablet, Rfl: 0   ranolazine (RANEXA) 1000 MG SR tablet, Take 1 tablet (1,000 mg total) by mouth 2 (two) times daily., Disp: 60 tablet, Rfl: 0   torsemide (DEMADEX) 100 MG tablet, Take 0.5 tablets (50 mg total) by mouth daily., Disp: 30 tablet, Rfl: 0   traZODone (DESYREL) 150 MG tablet, TAKE 1 TABLET BY MOUTH AT BEDTIME, Disp: 90 tablet, Rfl: 4   venlafaxine XR (EFFEXOR-XR) 75 MG 24 hr capsule, TAKE 1 CAPSULE BY MOUTH DAILY WITH BREAKFAST, Disp: 90 capsule, Rfl: 4   zolpidem (AMBIEN) 5 MG tablet, Take 1 tablet (5 mg total) by mouth at bedtime as needed for up to 3 days for sleep. SNF use only.  Refills per SNF MD, Disp: 3 tablet, Rfl: 0   Accu-Chek Softclix Lancets lancets, Use as instructed to check sugar daily for type 2 diabetes., Disp: 100 each, Rfl: 4  Blood Glucose Calibration (ACCU-CHEK GUIDE CONTROL) LIQD, Use with blood glucose monitor as directed, Disp: 1 each, Rfl: 3   Blood Glucose Monitoring Suppl (ACCU-CHEK GUIDE) w/Device KIT, Use to check blood sugars as directed, Disp: 1  kit, Rfl: 0   ELIQUIS 5 MG TABS tablet, TAKE 1 TABLET BY MOUTH TWICE A DAY, Disp: 180 tablet, Rfl: 3   glucose blood (ACCU-CHEK GUIDE) test strip, Use as instructed to check sugar daily for type 2 diabetes., Disp: 100 strip, Rfl: 4   Family History  Problem Relation Age of Onset   Heart attack Mother    Depression Mother    Heart disease Mother    COPD Mother    Hypertension Mother    Heart attack Father    Diabetes Father    Depression Father    Heart disease Father    Cirrhosis Father    Parkinson's disease Brother      Social History   Tobacco Use   Smoking status: Former    Packs/day: 3.00    Years: 50.00    Additional pack years: 0.00    Total pack years: 150.00    Types: Cigarettes    Quit date: 04/22/2013    Years since quitting: 10.0   Smokeless tobacco: Never   Tobacco comments:    Reports not smoking for approx 8 years.  Vaping Use   Vaping Use: Never used  Substance Use Topics   Alcohol use: No    Comment: remotely quit alcohol use. Hx of heavy alcohol use.   Drug use: Yes    Types: Marijuana    Comment: occasionally    Allergies as of 04/27/2023 - Review Complete 04/27/2023  Allergen Reaction Noted   Prednisone Other (See Comments) and Hypertension 10/20/2015   Demerol  [meperidine hcl]  05/25/2019   Demerol [meperidine] Hives 03/22/2015   Jardiance [empagliflozin] Other (See Comments) 04/26/2018   Sulfa antibiotics Hives 03/22/2015   Albuterol sulfate [albuterol] Palpitations and Other (See Comments) 07/03/2015   Morphine sulfate Nausea And Vomiting, Rash, and Other (See Comments) 07/03/2015    Review of Systems:    All systems reviewed and negative except where noted in HPI.   Physical Exam:  Vital signs in last 24 hours: Temp:  [97.5 F (36.4 C)-98.2 F (36.8 C)] 98.2 F (36.8 C) (06/12 0943) Pulse Rate:  [54-77] 68 (06/12 0943) Resp:  [14-20] 20 (06/12 0943) BP: (107-189)/(66-113) 116/75 (06/12 0943) SpO2:  [94 %-100 %] 98 % (06/12  0943) Weight:  [81.6 kg] 81.6 kg (06/11 1159)   General:   Pleasant, cooperative in NAD Head:  Normocephalic and atraumatic. Eyes:   No icterus.   Conjunctiva pink. PERRLA. Ears:  Normal auditory acuity. Neck:  Supple; no masses or thyroidomegaly Lungs: Respirations even and unlabored. Lungs Rales to auscultation bilaterally.   No wheezes, crackles, or rhonchi.  Heart:  Regular rate and rhythm;  Without murmur, clicks, rubs or gallops Abdomen:  Soft, nondistended, nontender. Normal bowel sounds. No appreciable masses or hepatomegaly.  No rebound or guarding.  Rectal:  Not performed. Msk:  Symmetrical without gross deformities.  Strength generalized weakness Extremities:  Without edema, cyanosis or clubbing. Neurologic:  Alert and oriented x3;  grossly normal neurologically. Skin:  Intact without significant lesions or rashes. Psych:  Alert and cooperative. Normal affect.  LAB RESULTS:    Latest Ref Rng & Units 04/28/2023    6:40 AM 04/27/2023   12:01 PM 04/01/2023    6:13 PM  CBC  WBC 4.0 -  10.5 K/uL 6.3  6.9  10.9   Hemoglobin 13.0 - 17.0 g/dL 16.1  09.6  04.5   Hematocrit 39.0 - 52.0 % 37.7  38.3  51.8   Platelets 150 - 400 K/uL 135  118  183     BMET    Latest Ref Rng & Units 04/28/2023    6:40 AM 04/27/2023   12:01 PM 04/01/2023    6:13 PM  BMP  Glucose 70 - 99 mg/dL 88  409  811   BUN 8 - 23 mg/dL 16  16  40   Creatinine 0.61 - 1.24 mg/dL 9.14  7.82  9.56   Sodium 135 - 145 mmol/L 141  140  134   Potassium 3.5 - 5.1 mmol/L 3.4  3.6  4.0   Chloride 98 - 111 mmol/L 102  110  89   CO2 22 - 32 mmol/L 30  21  35   Calcium 8.9 - 10.3 mg/dL 7.9  7.6  9.3     LFT    Latest Ref Rng & Units 04/27/2023    3:07 PM 03/29/2023    6:17 AM 03/12/2023   10:57 PM  Hepatic Function  Total Protein 6.5 - 8.1 g/dL 6.0  5.8  6.7   Albumin 3.5 - 5.0 g/dL 3.0  3.2  3.6   AST 15 - 41 U/L 18  22  14    ALT 0 - 44 U/L 18  20  15    Alk Phosphatase 38 - 126 U/L 53  49  66   Total Bilirubin  0.3 - 1.2 mg/dL 0.8  1.3  0.9   Bilirubin, Direct 0.0 - 0.2 mg/dL 0.2        STUDIES: CT ABDOMEN PELVIS W CONTRAST  Result Date: 04/27/2023 CLINICAL DATA:  Fall at home today. Blunt abdominal trauma. Abdominal pain. On Eliquis. EXAM: CT ABDOMEN AND PELVIS WITH CONTRAST TECHNIQUE: Multidetector CT imaging of the abdomen and pelvis was performed using the standard protocol following bolus administration of intravenous contrast. RADIATION DOSE REDUCTION: This exam was performed according to the departmental dose-optimization program which includes automated exposure control, adjustment of the mA and/or kV according to patient size and/or use of iterative reconstruction technique. CONTRAST:  OMNIPAQUE IOHEXOL 300 MG/ML  SOLN COMPARISON:  01/27/2023 FINDINGS: Lower chest: New multifocal areas of airspace opacity are seen in both lower lobes. New tiny bilateral pleural effusions also seen bilaterally. Hepatobiliary: No hepatic laceration or mass identified. Prior cholecystectomy. No evidence of biliary obstruction. Pancreas: No parenchymal laceration, mass, or inflammatory changes identified. Spleen: No evidence of splenic laceration. Adrenal/Urinary Tract: No hemorrhage or parenchymal lacerations identified. No evidence of suspicious renal masses or hydronephrosis. Unremarkable unopacified urinary bladder. Stomach/Bowel: Unopacified bowel loops are unremarkable in appearance. No evidence of hemoperitoneum. Vascular/Lymphatic: No evidence of abdominal aortic injury or retroperitoneal hemorrhage. Aortic atherosclerotic calcification incidentally noted. No pathologically enlarged lymph nodes identified. Reproductive:  No mass or other significant abnormality identified. Other:  Stable small left inguinal hernia, which contains only fat. Musculoskeletal: No acute fractures or suspicious bone lesions identified. IMPRESSION: No evidence of visceral injury, hemorrhage, or other acute findings within the abdomen or  pelvis. New multifocal airspace opacities in both lower lobes, and tiny bilateral pleural effusions. Differential diagnosis includes pulmonary contusion, aspiration, and pneumonia. Stable small left inguinal hernia, which contains only fat. Electronically Signed   By: Danae Orleans M.D.   On: 04/27/2023 16:53   DG Chest 2 View  Result Date: 04/27/2023 CLINICAL DATA:  Weakness EXAM: CHEST - 2 VIEW COMPARISON:  Chest x-ray dated March 12, 2023 FINDINGS: Cardiac and mediastinal contours are unchanged status post median sternotomy. Increased diffuse interstitial opacities. Trace bilateral pleural effusions. No evidence of pneumothorax. IMPRESSION: 1. Increased diffuse interstitial opacities, consistent with worsened pulmonary edema. 2. Trace bilateral pleural effusions. Electronically Signed   By: Allegra Lai M.D.   On: 04/27/2023 16:50   CT HEAD WO CONTRAST ( )  Result Date: 04/27/2023 CLINICAL DATA:  Mental status change, unknown cause.  Fall. EXAM: CT HEAD WITHOUT CONTRAST TECHNIQUE: Contiguous axial images were obtained from the base of the skull through the vertex without intravenous contrast. RADIATION DOSE REDUCTION: This exam was performed according to the departmental dose-optimization program which includes automated exposure control, adjustment of the mA and/or kV according to patient size and/or use of iterative reconstruction technique. COMPARISON:  Head CT 03/27/2023 and MRI 03/28/2023 FINDINGS: Brain: There is no evidence of an acute infarct, intracranial hemorrhage, mass, midline shift, or extra-axial fluid collection. Mild cerebral atrophy is within normal limits for age. Cerebral white matter hypodensities are unchanged and nonspecific but compatible with mild chronic small vessel ischemic disease. Vascular: No hyperdense vessel. Skull: No acute fracture or suspicious osseous lesion. Sinuses/Orbits: Mild mucosal thickening in the paranasal sinuses. No significant mastoid fluid.  Unremarkable orbits. Other: None. IMPRESSION: 1. No evidence of acute intracranial abnormality. 2. Mild chronic small vessel ischemic disease. Electronically Signed   By: Sebastian Ache M.D.   On: 04/27/2023 16:35      Impression / Plan:   Lawrence [name] is a 67 y.o. male with history of diabetes, coronary artery disease, s/p PCI, CHF, history of iron deficiency anemia is admitted with shortness of breath secondary to anemia and CHF exacerbation, ?  Dark stools concern for upper GI bleed  Acute on chronic iron deficiency anemia, ?  Dark stools Elevated BUN/creatinine about a month ago Continue pantoprazole drip for 72 hours Patient is currently being treated for CHF exacerbation Defer endoscopic evaluation until he recovers from acute on chronic heart failure Eliquis has been held since admission due to concern for GI bleed, last dose on 6/11 7:30 AM Monitor CBC closely, maintain hemoglobin above 8  Thrombocytopenia: New onset No evidence of splenomegaly or chronic liver disease Monitor for now, maintain platelets above 50  Thank you for involving me in the care of this patient.      LOS: 1 day   Lannette Donath, MD  04/28/2023, 11:52 AM    Note: This dictation was prepared with Dragon dictation along with smaller phrase technology. Any transcriptional errors that result from this process are unintentional.

## 2023-04-29 DIAGNOSIS — I5033 Acute on chronic diastolic (congestive) heart failure: Secondary | ICD-10-CM | POA: Diagnosis not present

## 2023-04-29 DIAGNOSIS — E1121 Type 2 diabetes mellitus with diabetic nephropathy: Secondary | ICD-10-CM

## 2023-04-29 DIAGNOSIS — I480 Paroxysmal atrial fibrillation: Secondary | ICD-10-CM

## 2023-04-29 DIAGNOSIS — D649 Anemia, unspecified: Secondary | ICD-10-CM | POA: Diagnosis not present

## 2023-04-29 LAB — CBG MONITORING, ED
Glucose-Capillary: 109 mg/dL — ABNORMAL HIGH (ref 70–99)
Glucose-Capillary: 173 mg/dL — ABNORMAL HIGH (ref 70–99)
Glucose-Capillary: 91 mg/dL (ref 70–99)

## 2023-04-29 LAB — GLUCOSE, CAPILLARY: Glucose-Capillary: 190 mg/dL — ABNORMAL HIGH (ref 70–99)

## 2023-04-29 MED ORDER — POLYSACCHARIDE IRON COMPLEX 150 MG PO CAPS
150.0000 mg | ORAL_CAPSULE | Freq: Every day | ORAL | Status: DC
  Administered 2023-04-30 – 2023-05-04 (×5): 150 mg via ORAL
  Filled 2023-04-29 (×6): qty 1

## 2023-04-29 MED ORDER — POLYETHYLENE GLYCOL 3350 17 GM/SCOOP PO POWD
1.0000 | Freq: Once | ORAL | Status: AC
  Administered 2023-04-29: 255 g via ORAL
  Filled 2023-04-29: qty 255

## 2023-04-29 MED ORDER — TORSEMIDE 20 MG PO TABS
50.0000 mg | ORAL_TABLET | Freq: Every day | ORAL | Status: DC
  Administered 2023-04-30 – 2023-05-04 (×5): 50 mg via ORAL
  Filled 2023-04-29 (×5): qty 3

## 2023-04-29 NOTE — Progress Notes (Signed)
[facility]  - [place] [facility]   PATIENT NAME: Lawrence [name]    MR#:  [id number]  DATE OF BIRTH:  06/03/1956  SUBJECTIVE:  no family at bedside patient not the best historian. Tells me came in because of weakness. Per Rn asking for pain meds and xanax freq   VITALS:  Blood pressure 126/73, pulse 79, temperature 98.1 F (36.7 C), temperature source Oral, resp. rate 17, height 5\' 7"  (1.702 m), weight 81.6 kg, SpO2 96 %.  PHYSICAL EXAMINATION:   GENERAL:  67 y.o.-year-old patient with no acute distress.  LUNGS decrease breath sounds bilaterally CARDIOVASCULAR: S1, S2 normal. No murmur   ABDOMEN: Soft, nontender, nondistended. Bowel sounds present.  EXTREMITIES: No  edema b/l.    NEUROLOGIC: nonfocal  patient is alert and awake   LABORATORY PANEL:  CBC Recent Labs  Lab 04/28/23 0640  WBC 6.3  HGB 11.7*  HCT 37.7*  PLT 135*     Chemistries  Recent Labs  Lab 04/27/23 1507 04/28/23 0640  NA  --  141  K  --  3.4*  CL  --  102  CO2  --  30  GLUCOSE  --  88  BUN  --  16  CREATININE  --  0.88  CALCIUM  --  7.9*  AST 18  --   ALT 18  --   ALKPHOS 53  --   BILITOT 0.8  --     Cardiac Enzymes No results for input(s): "TROPONINI" in the last 168 hours. RADIOLOGY:  CT ABDOMEN PELVIS W CONTRAST  Result Date: 04/27/2023 CLINICAL DATA:  Fall at home today. Blunt abdominal trauma. Abdominal pain. On Eliquis. EXAM: CT ABDOMEN AND PELVIS WITH CONTRAST TECHNIQUE: Multidetector CT imaging of the abdomen and pelvis was performed using the standard protocol following bolus administration of intravenous contrast. RADIATION DOSE REDUCTION: This exam was performed according to the departmental dose-optimization program which includes automated exposure control, adjustment of the mA and/or kV according to patient size and/or use of iterative reconstruction technique. CONTRAST:  OMNIPAQUE IOHEXOL 300 MG/ML  SOLN COMPARISON:  01/27/2023 FINDINGS: Lower  chest: New multifocal areas of airspace opacity are seen in both lower lobes. New tiny bilateral pleural effusions also seen bilaterally. Hepatobiliary: No hepatic laceration or mass identified. Prior cholecystectomy. No evidence of biliary obstruction. Pancreas: No parenchymal laceration, mass, or inflammatory changes identified. Spleen: No evidence of splenic laceration. Adrenal/Urinary Tract: No hemorrhage or parenchymal lacerations identified. No evidence of suspicious renal masses or hydronephrosis. Unremarkable unopacified urinary bladder. Stomach/Bowel: Unopacified bowel loops are unremarkable in appearance. No evidence of hemoperitoneum. Vascular/Lymphatic: No evidence of abdominal aortic injury or retroperitoneal hemorrhage. Aortic atherosclerotic calcification incidentally noted. No pathologically enlarged lymph nodes identified. Reproductive:  No mass or other significant abnormality identified. Other:  Stable small left inguinal hernia, which contains only fat. Musculoskeletal: No acute fractures or suspicious bone lesions identified. IMPRESSION: No evidence of visceral injury, hemorrhage, or other acute findings within the abdomen or pelvis. New multifocal airspace opacities in both lower lobes, and tiny bilateral pleural effusions. Differential diagnosis includes pulmonary contusion, aspiration, and pneumonia. Stable small left inguinal hernia, which contains only fat. Electronically Signed   By: Danae Orleans M.D.   On: 04/27/2023 16:53   DG Chest 2 View  Result Date: 04/27/2023 CLINICAL DATA:  Weakness EXAM: CHEST - 2 VIEW COMPARISON:  Chest x-ray dated March 12, 2023 FINDINGS: Cardiac and mediastinal contours are unchanged status post median sternotomy. Increased diffuse interstitial opacities. Trace  bilateral pleural effusions. No evidence of pneumothorax. IMPRESSION: 1. Increased diffuse interstitial opacities, consistent with worsened pulmonary edema. 2. Trace bilateral pleural effusions.  Electronically Signed   By: Allegra Lai M.D.   On: 04/27/2023 16:50   CT HEAD WO CONTRAST ( )  Result Date: 04/27/2023 CLINICAL DATA:  Mental status change, unknown cause.  Fall. EXAM: CT HEAD WITHOUT CONTRAST TECHNIQUE: Contiguous axial images were obtained from the base of the skull through the vertex without intravenous contrast. RADIATION DOSE REDUCTION: This exam was performed according to the departmental dose-optimization program which includes automated exposure control, adjustment of the mA and/or kV according to patient size and/or use of iterative reconstruction technique. COMPARISON:  Head CT 03/27/2023 and MRI 03/28/2023 FINDINGS: Brain: There is no evidence of an acute infarct, intracranial hemorrhage, mass, midline shift, or extra-axial fluid collection. Mild cerebral atrophy is within normal limits for age. Cerebral white matter hypodensities are unchanged and nonspecific but compatible with mild chronic small vessel ischemic disease. Vascular: No hyperdense vessel. Skull: No acute fracture or suspicious osseous lesion. Sinuses/Orbits: Mild mucosal thickening in the paranasal sinuses. No significant mastoid fluid. Unremarkable orbits. Other: None. IMPRESSION: 1. No evidence of acute intracranial abnormality. 2. Mild chronic small vessel ischemic disease. Electronically Signed   By: Sebastian Ache M.D.   On: 04/27/2023 16:35    Assessment and Plan  Lawrence [name] is a 67 y.o. male with past medical history of CAD status post STEMI, chronic angina, heart failure with preserved EF, A-fib, COPD, here with epigastric discomfort and generalized weakness.  The patient states that today, he woke up, and essentially is unable to support himself due to weakness.   BNP was elevated at 1093.5, high sensitive troponin was 17, lactic acid was 1.7 on repeat was 1.3.   Acute on chronic congestive heart failure diastolic -- came in with nonspecific symptoms of generalized weakness not able to get  around elevated BNP --  IV Lasix 40 mg bid-->8L UOP--change to po torsemide from am -- patient on oxygen--wean as tolerated -- continue cardiac meds -- patient follows with [facility] cardiology  Acute on chronic anemia -- hemoglobin 14.8--15.2 -- came in with hemoglobin of 11.2 -- patient unable to give any history of dark stools -- on eliquis for a fib--- now holding -- G.I. consultation with Dr. Vanga--EGD/Colonscopy tomorrow 6/14 -- transfuse as needed  Paroxysmal a fib -- resumed amiodarone -- hold eliquis for now  CAD -- continue statins,ranexa -- hold aspirin and lisinopril-- soft BP  COPD -- stable  PT OT to see patient due to generalized weakness  Family communication :none today Consults :GI CODE STATUS: DNR DVT Prophylaxis :SCD Level of care: Telemetry Cardiac Status is: Inpatient Remains inpatient appropriate because: CHF, ?GI bleed  Pt is from [facility] and will return there at discharge    TOTAL TIME TAKING CARE OF THIS PATIENT: 35 minutes.  >50% time spent on counselling and coordination of care  Note: This dictation was prepared with Dragon dictation along with smaller phrase technology. Any transcriptional errors that result from this process are unintentional.  Enedina Finner M.D    [facility]   CC: Primary care physician; Malva Limes, MD

## 2023-04-29 NOTE — ED Notes (Signed)
 drained out of urinary bag at this time.

## 2023-04-29 NOTE — ED Notes (Signed)
Pt called out for the bedside commode. Pt ambulated to bedside commode x1 assistance. Tolerated well. C/o not being able to have a BM but felt the need to. RN made aware. Pt linens changed, new chux pad placed. New gown donned. Upon inspection of condom cath, this NT found it to be completely displace and tangled in pubic hair. Condom cath carefully removed. Male Purewick placed.

## 2023-04-29 NOTE — ED Notes (Signed)
Patient requesting "my other Xanax."  States "it has been a little bit since I asked.  Can I have it now?"  Patient informed that he would need to wait for an additional dose at this time

## 2023-04-29 NOTE — ED Notes (Signed)
Pt up to bedside commode

## 2023-04-29 NOTE — ED Notes (Signed)
Patient currently resting with eyes closed.  Respirations even and unlabored.  External catheter remains in place

## 2023-04-29 NOTE — Evaluation (Addendum)
Physical Therapy Evaluation Patient Details Name: Lawrence [name] MRN: [id number] DOB: 1956-02-09 Today's Date: 04/29/2023  History of Present Illness  Lawrence [name] is a 67yoM who comes to [facility] on 6/11 after sudden weakness at home, unable to rise from couch as in previous days, ended up on floor, unable to rise. On arrival, BNP >1000, Hb down from 14 to 11, being looked at for possible ABLA. Pt reports being home from STR about 1 week, feels to have made good progress there, returned to household AMB ad lib with RW. Pt was unable to make his first HHPT appointment.  Clinical Impression  Pt seen still in ED, agreeable to evaluation. Pt given interval history since DC back to home. Pt requires minA to standing, but tolerates standing for orthostatic vitals check. Pt has weakness in legs with standing still, not sure he would tolerate very much AMB. Pt not orthostatic this date, a major ongoing limitation during last admission. Pt hopes to regain strength and independent mobility so he can return to home and start HHPT. His function is not quite there at this time.  Orthostatic VS for the past 24 hrs:  BP- Lying Pulse- Lying BP- Sitting Pulse- Sitting BP- Standing at 0 minutes Pulse- Standing at 0 minutes  04/29/23 1429 143/73 70 (!) 151/101 69 (!) 152/100 70         Recommendations for follow up therapy are one component of a multi-disciplinary discharge planning process, led by the attending physician.  Recommendations may be updated based on patient status, additional functional criteria and insurance authorization.  Follow Up Recommendations       Assistance Recommended at Discharge Set up Supervision/Assistance  Patient can return home with the following  Assist for transportation;Assistance with cooking/housework;Help with stairs or ramp for entrance    Equipment Recommendations None recommended by PT  Recommendations for Other Services       Functional Status Assessment Patient  has had a recent decline in their functional status and demonstrates the ability to make significant improvements in function in a reasonable and predictable amount of time.     Precautions / Restrictions Precautions Precautions: Fall Restrictions Weight Bearing Restrictions: No      Mobility  Bed Mobility Overal bed mobility: Needs Assistance Bed Mobility: Supine to Sit, Sit to Supine     Supine to sit: Modified independent (Device/Increase time), HOB elevated Sit to supine: Modified independent (Device/Increase time), HOB elevated        Transfers Overall transfer level: Needs assistance Equipment used: Rolling walker (2 wheels) Transfers: Sit to/from Stand Sit to Stand: Min assist           General transfer comment: legs still weak, but better compared to immediately PTA    Ambulation/Gait Ambulation/Gait assistance:  (deferred to later session)                Stairs            Wheelchair Mobility    Modified Rankin (Stroke Patients Only)       Balance                                             Pertinent Vitals/Pain Pain Assessment Pain Assessment: No/denies pain    Home Living Family/patient expects to be discharged to:: Private residence Living Arrangements: Alone Available Help at Discharge: Available PRN/intermittently;Friend(s) Type of Home:  Apartment Home Access: Level entry;Ramped entrance       Home Layout: One level Home Equipment: Shower seat - built in;Rollator (4 wheels) Additional Comments: O2 at night (3L)    Prior Function Prior Level of Function : Independent/Modified Independent;History of Falls (last six months)             Mobility Comments: Mod I using rollator, >4 falls since Feb 2024. ADLs Comments: Pt reports IND for ADLs/IADLs. Pt not driving, receives medical transport to appointments and friend takes him to grocery store.     Hand Dominance   Dominant Hand: Right     Extremity/Trunk Assessment                Communication   Communication: No difficulties  Cognition Arousal/Alertness: Awake/alert Behavior During Therapy: WFL for tasks assessed/performed Overall Cognitive Status: Within Functional Limits for tasks assessed                                          General Comments      Exercises     Assessment/Plan    PT Assessment Patient needs continued PT services  PT Problem List Decreased strength;Decreased range of motion;Decreased activity tolerance;Decreased balance;Decreased knowledge of use of DME;Decreased knowledge of precautions;Decreased mobility       PT Treatment Interventions DME instruction;Gait training;Functional mobility training;Therapeutic activities;Therapeutic exercise;Balance training;Patient/family education    PT Goals (Current goals can be found in the Care Plan section)  Acute Rehab PT Goals Patient Stated Goal: regain strength for independent mobility PT Goal Formulation: With patient Time For Goal Achievement: 05/13/23 Potential to Achieve Goals: Good    Frequency Min 3X/week     Co-evaluation               AM-PAC PT "6 Clicks" Mobility  Outcome Measure Help needed turning from your back to your side while in a flat bed without using bedrails?: None Help needed moving from lying on your back to sitting on the side of a flat bed without using bedrails?: None Help needed moving to and from a bed to a chair (including a wheelchair)?: A Lot Help needed standing up from a chair using your arms (e.g., wheelchair or bedside chair)?: A Lot Help needed to walk in hospital room?: A Little Help needed climbing 3-5 steps with a railing? : A Lot 6 Click Score: 17    End of Session Equipment Utilized During Treatment: Oxygen Activity Tolerance: Patient limited by fatigue;No increased pain;Patient tolerated treatment well Patient left: in bed;with call bell/phone within reach Nurse  Communication: Mobility status PT Visit Diagnosis: Difficulty in walking, not elsewhere classified (R26.2);Muscle weakness (generalized) (M62.81)    Time: 1610-9604 PT Time Calculation (min) (ACUTE ONLY): 25 min   Charges:   PT Evaluation $PT Eval Moderate Complexity: 1 Mod PT Treatments $Therapeutic Activity: 8-22 mins       3:28 PM, 04/29/23 Rosamaria Lints, PT, DPT Physical Therapist - [facility]  [phone] (ASCOM)     Torrence Branagan C 04/29/2023, 3:25 PM

## 2023-04-30 DIAGNOSIS — D649 Anemia, unspecified: Secondary | ICD-10-CM | POA: Diagnosis not present

## 2023-04-30 LAB — GLUCOSE, CAPILLARY
Glucose-Capillary: 102 mg/dL — ABNORMAL HIGH (ref 70–99)
Glucose-Capillary: 226 mg/dL — ABNORMAL HIGH (ref 70–99)
Glucose-Capillary: 104 mg/dL — ABNORMAL HIGH (ref 70–99)
Glucose-Capillary: 141 mg/dL — ABNORMAL HIGH (ref 70–99)

## 2023-04-30 MED ORDER — PEG 3350-KCL-NA BICARB-NACL 420 G PO SOLR
4000.0000 mL | Freq: Once | ORAL | Status: AC
  Administered 2023-04-30: 4000 mL via ORAL
  Filled 2023-04-30: qty 4000

## 2023-04-30 MED ORDER — SODIUM CHLORIDE 0.9 % IV SOLN: INTRAVENOUS | Status: DC

## 2023-04-30 NOTE — Plan of Care (Signed)
Patient is participating in goals of care to meet goals for discharge.  Terrilyn Saver, RN    Problem: Education: Goal: Ability to describe self-care measures that may prevent or decrease complications (Diabetes Survival Skills Education) will improve Outcome: Progressing Goal: Individualized Educational Video(s) Outcome: Progressing   Problem: Coping: Goal: Ability to adjust to condition or change in health will improve Outcome: Progressing   Problem: Fluid Volume: Goal: Ability to maintain a balanced intake and output will improve Outcome: Progressing   Problem: Health Behavior/Discharge Planning: Goal: Ability to identify and utilize available resources and services will improve Outcome: Progressing Goal: Ability to manage health-related needs will improve Outcome: Progressing   Problem: Metabolic: Goal: Ability to maintain appropriate glucose levels will improve Outcome: Progressing   Problem: Nutritional: Goal: Maintenance of adequate nutrition will improve Outcome: Progressing Goal: Progress toward achieving an optimal weight will improve Outcome: Progressing   Problem: Skin Integrity: Goal: Risk for impaired skin integrity will decrease Outcome: Progressing   Problem: Tissue Perfusion: Goal: Adequacy of tissue perfusion will improve Outcome: Progressing   Problem: Education: Goal: Knowledge of General Education information will improve Description: Including pain rating scale, medication(s)/side effects and non-pharmacologic comfort measures Outcome: Progressing   Problem: Health Behavior/Discharge Planning: Goal: Ability to manage health-related needs will improve Outcome: Progressing   Problem: Clinical Measurements: Goal: Ability to maintain clinical measurements within normal limits will improve Outcome: Progressing Goal: Will remain free from infection Outcome: Progressing Goal: Diagnostic test results will improve Outcome: Progressing Goal:  Respiratory complications will improve Outcome: Progressing Goal: Cardiovascular complication will be avoided Outcome: Progressing   Problem: Activity: Goal: Risk for activity intolerance will decrease Outcome: Progressing   Problem: Nutrition: Goal: Adequate nutrition will be maintained Outcome: Progressing   Problem: Coping: Goal: Level of anxiety will decrease Outcome: Progressing   Problem: Elimination: Goal: Will not experience complications related to bowel motility Outcome: Progressing Goal: Will not experience complications related to urinary retention Outcome: Progressing   Problem: Pain Managment: Goal: General experience of comfort will improve Outcome: Progressing   Problem: Safety: Goal: Ability to remain free from injury will improve Outcome: Progressing   Problem: Skin Integrity: Goal: Risk for impaired skin integrity will decrease Outcome: Progressing   Problem: Education: Goal: Ability to identify signs and symptoms of gastrointestinal bleeding will improve Outcome: Progressing   Problem: Bowel/Gastric: Goal: Will show no signs and symptoms of gastrointestinal bleeding Outcome: Progressing   Problem: Fluid Volume: Goal: Will show no signs and symptoms of excessive bleeding Outcome: Progressing   Problem: Clinical Measurements: Goal: Complications related to the disease process, condition or treatment will be avoided or minimized Outcome: Progressing

## 2023-04-30 NOTE — Progress Notes (Signed)
Physical Therapy Treatment Patient Details Name: Lawrence [name] MRN: [id number] DOB: 1956/10/19 Today's Date: 04/30/2023   History of Present Illness Lawrence [name] is a 67yoM who comes to [facility] on 6/11 after sudden weakness at home, unable to rise from couch as in previous days, ended up on floor, unable to rise. On arrival, BNP >1000, Hb down from 14 to 11, being looked at for possible ABLA. Pt reports being home from STR about 1 week, feels to have made good progress there, returned to household AMB ad lib with RW. Pt was unable to make his first HHPT appointment.    PT Comments    Pt in bed, NA at bedside, toilet transfer just completed. Pt agreeable to PT session. Pt able to partake in 19ft AMB today, unlikely previous day. Pt's transfers are just a smidgen better, but remain weak. Pt reports ot feel generally much better than previous day. Will continue to follow in hopes to optimize for return to home at DC.     Recommendations for follow up therapy are one component of a multi-disciplinary discharge planning process, led by the attending physician.  Recommendations may be updated based on patient status, additional functional criteria and insurance authorization.  Follow Up Recommendations       Assistance Recommended at Discharge Set up Supervision/Assistance  Patient can return home with the following Assist for transportation;Assistance with cooking/housework;Help with stairs or ramp for entrance   Equipment Recommendations  None recommended by PT    Recommendations for Other Services       Precautions / Restrictions Precautions Precautions: Fall Restrictions Weight Bearing Restrictions: No     Mobility  Bed Mobility Overal bed mobility: Modified Independent Bed Mobility: Supine to Sit, Sit to Supine     Supine to sit: Modified independent (Device/Increase time) Sit to supine: Modified independent (Device/Increase time)        Transfers Overall transfer level:  Needs assistance Equipment used: Rolling walker (2 wheels) Transfers: Sit to/from Stand Sit to Stand: Min guard, From elevated surface           General transfer comment: somewhat stronger than previous day, but still requires max effort    Ambulation/Gait Ambulation/Gait assistance: Min guard Gait Distance (Feet): 48 Feet Assistive device: Rolling walker (2 wheels)         General Gait Details: alternating fwd, backward with RW, 2L O2; fatigued, unable to go farther, 1 LOB on final lap.   Stairs             Wheelchair Mobility    Modified Rankin (Stroke Patients Only)       Balance                                            Cognition                                                Exercises      General Comments General comments (skin integrity, edema, etc.): Pt on 2L O2 throughout session, 91-92% saturation throughout session.      Pertinent Vitals/Pain Pain Assessment Pain Assessment: No/denies pain    Home Living Family/patient expects to be discharged to:: Private residence Living Arrangements: Alone Available Help at Discharge: Available  PRN/intermittently;Friend(s) Type of Home: Apartment Home Access: Level entry;Ramped entrance       Home Layout: One level Home Equipment: Shower seat - built in;Rollator (4 wheels) Additional Comments: O2 2L    Prior Function            PT Goals (current goals can now be found in the care plan section) Acute Rehab PT Goals Patient Stated Goal: regain strength for independent mobility PT Goal Formulation: With patient Time For Goal Achievement: 05/13/23 Potential to Achieve Goals: Good Progress towards PT goals: Progressing toward goals    Frequency    Min 3X/week      PT Plan Current plan remains appropriate    Co-evaluation              AM-PAC PT "6 Clicks" Mobility   Outcome Measure  Help needed turning from your back to your side while  in a flat bed without using bedrails?: None Help needed moving from lying on your back to sitting on the side of a flat bed without using bedrails?: None Help needed moving to and from a bed to a chair (including a wheelchair)?: A Little Help needed standing up from a chair using your arms (e.g., wheelchair or bedside chair)?: A Little Help needed to walk in hospital room?: A Little Help needed climbing 3-5 steps with a railing? : A Lot 6 Click Score: 19    End of Session Equipment Utilized During Treatment: Oxygen Activity Tolerance: Patient limited by fatigue;No increased pain;Patient tolerated treatment well Patient left: in bed;with call bell/phone within reach Nurse Communication: Mobility status PT Visit Diagnosis: Difficulty in walking, not elsewhere classified (R26.2);Muscle weakness (generalized) (M62.81)     Time: 1610-9604 PT Time Calculation (min) (ACUTE ONLY): 16 min  Charges:  $Therapeutic Activity: 8-22 mins                    12:58 PM, 04/30/23 Rosamaria Lints, PT, DPT Physical Therapist - [facility]  [phone] (ASCOM)    Lawrence [name] 04/30/2023, 12:56 PM

## 2023-04-30 NOTE — Progress Notes (Signed)
Patient's stool is lighter brown and thinner in consistancy

## 2023-04-30 NOTE — Care Management Important Message (Signed)
Important Message  Patient Details  Name: Lawrence [name] MRN: [id number] Date of Birth: 1956/11/06   Medicare Important Message Given:  N/A - LOS <3 / Initial given by admissions     [name] [name] 04/30/2023, 10:21 AM

## 2023-04-30 NOTE — Evaluation (Signed)
Occupational Therapy Evaluation Patient Details Name: Lawrence [name] MRN: [id number] DOB: [birth year] Today's Date: 04/30/2023   History of Present Illness Lawrence [name] is a 67yoM who comes to [facility] on 6/11 after sudden weakness at home, unable to rise from couch as in previous days, ended up on floor, unable to rise. On arrival, BNP >1000, Hb down from 14 to 11, being looked at for possible ABLA. Pt reports being home from STR about 1 week, feels to have made good progress there, returned to household AMB ad lib with RW. Pt was unable to make his first HHPT appointment.   Clinical Impression   Patient received for OT evaluation. See flowsheet below for details of function. Generally, patient requiring MOD (I) for bed mobility, CGA with RW for short functional mobility (walked 3 feet to chair), and set up-MIN A for ADLs. Patient will benefit from continued OT while in acute care.   Recommendations for follow up therapy are one component of a multi-disciplinary discharge planning process, led by the attending physician.  Recommendations may be updated based on patient status, additional functional criteria and insurance authorization.   Assistance Recommended at Discharge Intermittent Supervision/Assistance  Patient can return home with the following Assistance with cooking/housework;Assist for transportation    Functional Status Assessment  Patient has had a recent decline in their functional status and demonstrates the ability to make significant improvements in function in a reasonable and predictable amount of time.  Equipment Recommendations  BSC/3in1    Recommendations for Other Services       Precautions / Restrictions Precautions Precautions: Fall Restrictions Weight Bearing Restrictions: No      Mobility Bed Mobility Overal bed mobility: Modified Independent Bed Mobility: Supine to Sit     Supine to sit: Modified independent (Device/Increase time)           Transfers Overall transfer level: Needs assistance Equipment used: 1 person hand held assist Transfers: Sit to/from Stand Sit to Stand: Min assist                  Balance Overall balance assessment: Needs assistance Sitting-balance support: Feet supported Sitting balance-Leahy Scale: Good     Standing balance support: Reliant on assistive device for balance Standing balance-Leahy Scale: Fair                             ADL either performed or assessed with clinical judgement   ADL Overall ADL's : Needs assistance/impaired Eating/Feeding: Independent     Grooming Details (indicate cue type and reason): not tested (O2 not reaching to sink, but pt able to stand with RW to t/f to recliner with CGA)   Upper Body Bathing Details (indicate cue type and reason): anticipate set up from seated   Lower Body Bathing Details (indicate cue type and reason): anticipate set up from seated; pt able to make figure four position in seated in recliner   Upper Body Dressing Details (indicate cue type and reason): anticipate set up   Lower Body Dressing Details (indicate cue type and reason): anticipate set up; pt able to demonstrate figure four position while seated in recliner and states that he typically sits for LB dressing mostly Toilet Transfer: Minimal assistance;Stand-pivot (with handheld assist)   Toileting- Clothing Manipulation and Hygiene: Set up;Sitting/lateral lean (for toilet hygiene)     Tub/Shower Transfer Details (indicate cue type and reason): anticipate SBA with shower chair and RW/rollator Functional mobility during ADLs: Minimal  assistance;Min guard General ADL Comments: pt using HHA x1 MIN A to t/f to [facility] next to bed; pt using RW with CGA x1 from [facility] to recliner (approx 3 feet away).     Vision Baseline Vision/History: 1 Wears glasses Ability to See in Adequate Light: 0 Adequate Patient Visual Report: No change from baseline Vision Assessment?: No  apparent visual deficits     Perception     Praxis      Pertinent Vitals/Pain Pain Assessment Pain Assessment: No/denies pain     Hand Dominance Right   Extremity/Trunk Assessment Upper Extremity Assessment Upper Extremity Assessment: Generalized weakness   Lower Extremity Assessment Lower Extremity Assessment: Defer to PT evaluation   Cervical / Trunk Assessment Cervical / Trunk Assessment: Normal   Communication Communication Communication: No difficulties   Cognition Arousal/Alertness: Awake/alert Behavior During Therapy: WFL for tasks assessed/performed Overall Cognitive Status: Within Functional Limits for tasks assessed                                 General Comments: Alert and oriented, pleasant. Able to follow all commands. Able to tell OT about when his wife died and provided details/dates and amount of time since then that is correct.     General Comments  Pt on 2L O2 throughout session, 91-92% saturation throughout session.    Exercises     Shoulder Instructions      Home Living Family/patient expects to be discharged to:: Private residence Living Arrangements: Alone Available Help at Discharge: Available PRN/intermittently;Friend(s) Type of Home: Apartment Home Access: Level entry;Ramped entrance     Home Layout: One level     Bathroom Shower/Tub: Producer, television/film/video: Standard     Home Equipment: Shower seat - built in;Rollator (4 wheels)   Additional Comments: O2 2L      Prior Functioning/Environment Prior Level of Function : Independent/Modified Independent;History of Falls (last six months)             Mobility Comments: Mod I using rollator, >4 falls since Feb 2024. ADLs Comments: Pt reports IND for ADLs/IADLs. Pt not driving, receives medical transport to appointments and friend takes him to grocery store. States he walks to [employer] and watches TV.        OT Problem List: Decreased activity  tolerance;Impaired balance (sitting and/or standing)      OT Treatment/Interventions: Self-care/ADL training;Therapeutic exercise;Therapeutic activities;Patient/family education    OT Goals(Current goals can be found in the care plan section) Acute Rehab OT Goals Patient Stated Goal: Get better OT Goal Formulation: With patient Time For Goal Achievement: 05/14/23 Potential to Achieve Goals: Good ADL Goals Pt Will Perform Grooming: with modified independence;standing Pt Will Perform Lower Body Bathing: with modified independence;sit to/from stand Pt Will Perform Lower Body Dressing: with modified independence;sit to/from stand Pt Will Transfer to Toilet: with modified independence;bedside commode  OT Frequency: Min 2X/week    Co-evaluation              AM-PAC OT "6 Clicks" Daily Activity     Outcome Measure Help from another person eating meals?: None Help from another person taking care of personal grooming?: A Little Help from another person toileting, which includes using toliet, bedpan, or urinal?: A Little Help from another person bathing (including washing, rinsing, drying)?: None Help from another person to put on and taking off regular upper body clothing?: None Help from another person to put on  and taking off regular lower body clothing?: A Little 6 Click Score: 21   End of Session Equipment Utilized During Treatment: Rolling walker (2 wheels);Gait belt Nurse Communication: Mobility status;Other (comment) (RN aware that call bell on but not connected to nurse call)  Activity Tolerance: Patient tolerated treatment well Patient left: in chair;with chair alarm set;with call bell/phone within reach  OT Visit Diagnosis: Unsteadiness on feet (R26.81);Repeated falls (R29.6)                Time: 1610-9604 OT Time Calculation (min): 42 min Charges:  OT General Charges $OT Visit: 1 Visit OT Evaluation $OT Eval Moderate Complexity: 1 Mod OT Treatments $Self Care/Home  Management : 23-37 mins  Linward Foster, MS, OTR/L  Alvester Morin 04/30/2023, 12:40 PM

## 2023-04-30 NOTE — Progress Notes (Signed)
Patient beginning prep now

## 2023-04-30 NOTE — Progress Notes (Signed)
Pt completed Miralax at 2345.
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
## 2023-04-30 NOTE — Progress Notes (Signed)
Pt arrived to 2A 231, alert and oriented x 4, identified appropriately. VS stable, denied chest pain, SOB, no signs of acute distress. Cardiac monitor in place and CCMD notified. Pt oriented to room and equipment, instructed to use call bell for assistance. Call bell left within pt reach.

## 2023-04-30 NOTE — Progress Notes (Signed)
[facility] Progress Note  Patient: Lawrence [name]    WUJ:[id number]  DOA: 04/27/2023     Date of Service: the patient was seen and examined on 04/30/2023  Chief Complaint  Patient presents with   Fall   Weakness   Brief hospital course:  Lawrence [name] is a 67 y.o. male with past medical history of CAD status post STEMI, chronic angina, heart failure with preserved EF, A-fib, COPD, here with epigastric discomfort and generalized weakness.  The patient states that today, he woke up, and essentially is unable to support himself due to weakness.    BNP was elevated at 1093.5, high sensitive troponin was 17, lactic acid was 1.7 on repeat was 1.3.   Assessment and Plan: Acute on chronic congestive heart failure diastolic -- came in with nonspecific symptoms of generalized weakness not able to get around elevated BNP --  IV Lasix 40 mg bid-->8L UOP--change to po torsemide  -- patient on oxygen--wean as tolerated -- continue cardiac meds -- patient follows with [facility] cardiology   Acute on chronic anemia -- hemoglobin 14.8--15.2 -- came in with hemoglobin of 11.2 -- patient unable to give any history of dark stools -- on eliquis for a fib--- now holding -- G.I. consultation with Dr. Vanga--EGD/Colonscopy tomorrow 6/15 -- transfuse as needed Continue PPI IV infusion for 72 hours, followed by 40 mg IV twice daily    Iron deficiency, transferrin saturation 7% Continue oral iron supplement  Paroxysmal a fib -- resumed amiodarone -- hold eliquis for now   CAD -- continue statins,ranexa -- hold aspirin and lisinopril-- soft BP   COPD -- stable   PT OT to see patient due to generalized weakness  Body mass index is 29.42 kg/m.  Interventions:     Diet: Clear liquid diet DVT Prophylaxis: SCD, pharmacological prophylaxis contraindicated due to anemia possible GI bleeding    Advance goals of care discussion: DNR  Family Communication: family was not present at bedside,  at the time of interview.  The pt provided permission to discuss medical plan with the family. Opportunity was given to ask question and all questions were answered satisfactorily.   Disposition:  Pt is from [facility], admitted with symptomatic anemia, GI consulted, EGD and colonoscopy pending, which precludes a safe discharge. Discharge to [facility], when cleared by GI.  Subjective: No significant events overnight, patient denies any worsening of shortness of breath, no chest pain or palpitation, no abdominal pain. Patient is trying to drink GoLytely for GI prep, patient is aware that colonoscopy will be done tomorrow a.m. as he is not ready for scope yet.   Physical Exam: General: NAD, lying comfortably Appear in no distress, affect appropriate Eyes: PERRLA ENT: Oral Mucosa Clear, moist  Neck: no JVD,  Cardiovascular: S1 and S2 Present, no Murmur,  Respiratory: good respiratory effort, Bilateral Air entry equal and Decreased, no Crackles, no wheezes Abdomen: Bowel Sound present, Soft and no tenderness,  Skin: no rashes Extremities: no Pedal edema, no calf tenderness Neurologic: without any new focal findings Gait not checked due to patient safety concerns  Vitals:   04/30/23 0744 04/30/23 0855 04/30/23 1144 04/30/23 1614  BP:  (!) 144/92 116/68 (!) 122/91  Pulse:  67 85 74  Resp:  16 16 16   Temp:  97.9 F (36.6 C) 97.7 F (36.5 C) 97.7 F (36.5 C)  TempSrc:      SpO2: 95% 94% 100% (!) 82%  Weight:      Height:  Intake/Output Summary (Last 24 hours) at 04/30/2023 1640 Last data filed at 04/30/2023 1522 Gross per 24 hour  Intake 2051.81 ml  Output 2902 ml  Net -850.19 ml   Filed Weights   04/27/23 1159 04/30/23 0342  Weight: 81.6 kg 85.2 kg    Data Reviewed: I have personally reviewed and interpreted daily labs, tele strips, imagings as discussed above. I reviewed all nursing notes, pharmacy notes, vitals, pertinent old records I have discussed plan of  care as described above with RN and patient/family.  CBC: Recent Labs  Lab 04/27/23 1201 04/28/23 0640  WBC 6.9 6.3  HGB 11.7* 11.7*  HCT 38.3* 37.7*  MCV 88.5 87.5  PLT 118* 135*   Basic Metabolic Panel: Recent Labs  Lab 04/27/23 1201 04/28/23 0640  NA 140 141  K 3.6 3.4*  CL 110 102  CO2 21* 30  GLUCOSE 112* 88  BUN 16 16  CREATININE 0.89 0.88  CALCIUM 7.6* 7.9*    Studies: No results found.  Scheduled Meds:  amiodarone  200 mg Oral BID   arformoterol  15 mcg Nebulization BID   And   umeclidinium bromide  1 puff Inhalation Daily   atorvastatin  80 mg Oral Daily   budesonide  2 mL Inhalation BID   insulin aspart  0-15 Units Subcutaneous TID WC   insulin aspart  0-5 Units Subcutaneous QHS   iron polysaccharides  150 mg Oral Daily   [START ON 05/01/2023] pantoprazole  40 mg Intravenous Q12H   ranolazine  1,000 mg Oral BID   torsemide  50 mg Oral Daily   traZODone  150 mg Oral QHS   venlafaxine XR  75 mg Oral Q breakfast   Continuous Infusions:  pantoprazole 8 mg/hr (04/30/23 1404)   PRN Meds: acetaminophen **OR** acetaminophen, albuterol, ALPRAZolam, hydrALAZINE, meclizine, melatonin, nitroGLYCERIN, ondansetron **OR** ondansetron (ZOFRAN) IV, oxyCODONE-acetaminophen **AND** oxyCODONE, senna-docusate  Time spent: 35 minutes  Author: Gillis Santa. MD [facility] 04/30/2023 4:40 PM  To reach On-call, see care teams to locate the attending and reach out to them via [email or web address]. If 7PM-7AM, please contact night-coverage If you still have difficulty reaching the attending provider, please page the [facility] (Director on Call) for [facility] on amion for assistance.

## 2023-04-30 NOTE — Progress Notes (Signed)
Patient tolerated respiratory interventions well.  SVN given per order.

## 2023-05-01 ENCOUNTER — Inpatient Hospital Stay
Admit: 2023-05-01 | Discharge: 2023-05-01 | Disposition: A | Discharge status: Home / Self Care | Payer: Medicare HMO | Attending: Student | Admitting: Student

## 2023-05-01 ENCOUNTER — Inpatient Hospital Stay: Payer: Medicare HMO | Admitting: Anesthesiology

## 2023-05-01 ENCOUNTER — Encounter
Admission: EM | Disposition: A | Discharge status: Home / Self Care | Payer: Self-pay | Source: Home / Self Care | Attending: Student

## 2023-05-01 DIAGNOSIS — D649 Anemia, unspecified: Secondary | ICD-10-CM | POA: Diagnosis not present

## 2023-05-01 HISTORY — PX: ESOPHAGOGASTRODUODENOSCOPY (EGD) WITH PROPOFOL: SHX5813

## 2023-05-01 HISTORY — PX: COLONOSCOPY WITH PROPOFOL: SHX5780

## 2023-05-01 HISTORY — PX: GIVENS CAPSULE STUDY: SHX5432

## 2023-05-01 LAB — BASIC METABOLIC PANEL
Anion gap: 9 (ref 5–15)
BUN: 8 mg/dL (ref 8–23)
CO2: 35 mmol/L — ABNORMAL HIGH (ref 22–32)
Calcium: 8.9 mg/dL (ref 8.9–10.3)
Chloride: 100 mmol/L (ref 98–111)
Creatinine, Ser: 0.88 mg/dL (ref 0.61–1.24)
GFR, Estimated: >60 mL/min (ref >60)
Glucose, Bld: 114 mg/dL — ABNORMAL HIGH (ref 70–99)
Potassium: 3.7 mmol/L (ref 3.5–5.1)
Sodium: 144 mmol/L (ref 135–145)

## 2023-05-01 LAB — ECHOCARDIOGRAM COMPLETE
AR max vel: 2.94 cm2
AV Peak grad: 4.9 mmHg
Ao pk vel: 1.11 m/s
Area-P 1/2: 4.36 cm2
Calc EF: 55.7 %
Height: 67 in
S' Lateral: 3.4 cm
Single Plane A2C EF: 58.3 %
Single Plane A4C EF: 56.1 %
Weight: 2931.24 oz

## 2023-05-01 LAB — MAGNESIUM: Magnesium: 1.8 mg/dL (ref 1.7–2.4)

## 2023-05-01 LAB — CBC
HCT: 44.3 % (ref 39.0–52.0)
Hemoglobin: 13.9 g/dL (ref 13.0–17.0)
MCH: 27.4 pg (ref 26.0–34.0)
MCHC: 31.4 g/dL (ref 30.0–36.0)
MCV: 87.4 fL (ref 80.0–100.0)
Platelets: 173 10*3/uL (ref 150–400)
RBC: 5.07 MIL/uL (ref 4.22–5.81)
RDW: 16.5 % — ABNORMAL HIGH (ref 11.5–15.5)
WBC: 5.3 10*3/uL (ref 4.0–10.5)
nRBC: 0 % (ref 0.0–0.2)

## 2023-05-01 LAB — GLUCOSE, CAPILLARY
Glucose-Capillary: 111 mg/dL — ABNORMAL HIGH (ref 70–99)
Glucose-Capillary: 101 mg/dL — ABNORMAL HIGH (ref 70–99)
Glucose-Capillary: 116 mg/dL — ABNORMAL HIGH (ref 70–99)
Glucose-Capillary: 164 mg/dL — ABNORMAL HIGH (ref 70–99)

## 2023-05-01 LAB — PHOSPHORUS: Phosphorus: 3.8 mg/dL (ref 2.5–4.6)

## 2023-05-01 LAB — TROPONIN I (HIGH SENSITIVITY): Troponin I (High Sensitivity): 10494 ng/L (ref <18)

## 2023-05-01 SURGERY — ESOPHAGOGASTRODUODENOSCOPY (EGD) WITH PROPOFOL
Anesthesia: General

## 2023-05-01 MED ORDER — MORPHINE SULFATE (PF) 2 MG/ML IV SOLN
1.0000 mg | Freq: Once | INTRAVENOUS | Status: AC
  Administered 2023-05-02: 1 mg via INTRAVENOUS
  Filled 2023-05-01: qty 1

## 2023-05-01 MED ORDER — PROPOFOL 500 MG/50ML IV EMUL
INTRAVENOUS | Status: DC | PRN
  Administered 2023-05-01: 150 ug/kg/min via INTRAVENOUS

## 2023-05-01 MED ORDER — PROPOFOL 10 MG/ML IV BOLUS
INTRAVENOUS | Status: DC | PRN
  Administered 2023-05-01: 60 mg via INTRAVENOUS

## 2023-05-01 MED ORDER — MORPHINE SULFATE (PF) 2 MG/ML IV SOLN
1.0000 mg | Freq: Once | INTRAVENOUS | Status: AC
  Administered 2023-05-01: 1 mg via INTRAVENOUS
  Filled 2023-05-01: qty 1

## 2023-05-01 MED ORDER — PROPOFOL 1000 MG/100ML IV EMUL
INTRAVENOUS | Status: AC
  Filled 2023-05-01: qty 100

## 2023-05-01 MED ORDER — SODIUM CHLORIDE 0.9 % IV SOLN: INTRAVENOUS | Status: DC

## 2023-05-01 MED ORDER — LIDOCAINE HCL (CARDIAC) PF 100 MG/5ML IV SOSY
PREFILLED_SYRINGE | INTRAVENOUS | Status: DC | PRN
  Administered 2023-05-01: 40 mg via INTRAVENOUS

## 2023-05-01 MED ORDER — IPRATROPIUM-ALBUTEROL 0.5-2.5 (3) MG/3ML IN SOLN: 3.0000 mL | RESPIRATORY_TRACT | Status: DC | PRN

## 2023-05-01 MED ORDER — EPHEDRINE SULFATE (PRESSORS) 50 MG/ML IJ SOLN
INTRAMUSCULAR | Status: DC | PRN
  Administered 2023-05-01 (×2): 10 mg via INTRAVENOUS

## 2023-05-01 MED ORDER — PERFLUTREN LIPID MICROSPHERE
1.0000 mL | INTRAVENOUS | Status: AC | PRN
  Administered 2023-05-01: 4 mL via INTRAVENOUS

## 2023-05-01 NOTE — Progress Notes (Signed)
Patient c/o chest pain 9/10. PRN nitroglycerin and Oxy/Percocet combo given. However, per pt, nitroglycerin provides little relief for him during these episodes and states that morphine IV works better. Standing orders for chest pain initiated and NP [facility] notified, see new orders.  Lamonte Richer, RN

## 2023-05-01 NOTE — Anesthesia Preprocedure Evaluation (Signed)
Anesthesia Evaluation  Patient identified by MRN, date of birth, ID band Patient awake  General Assessment Comment:  Presented with lethargy  Reviewed: Allergy & Precautions, NPO status , Patient's Chart, lab work & pertinent test results  History of Anesthesia Complications Negative for: history of anesthetic complications  Airway Mallampati: III  TM Distance: <3 FB Neck ROM: full    Dental  (+) Edentulous Upper, Edentulous Lower   Pulmonary asthma , sleep apnea , COPD,  COPD inhaler and oxygen dependent, Patient abstained from smoking., former smoker   Pulmonary exam normal        Cardiovascular Exercise Tolerance: Poor hypertension, + CAD, + Past MI, + Cardiac Stents, + CABG and +CHF  + dysrhythmias Atrial Fibrillation  Rhythm:irregular Rate:Tachycardia  Cath in 2023 without significant coronary disease TTE 6 months ago fairly unremarkable   Neuro/Psych  PSYCHIATRIC DISORDERS Anxiety Depression     Neuromuscular disease    GI/Hepatic Neg liver ROS, hiatal hernia,GERD  Controlled,,  Endo/Other  negative endocrine ROSdiabetes    Renal/GU Renal disease  negative genitourinary   Musculoskeletal   Abdominal   Peds  Hematology negative hematology ROS (+)   Anesthesia Other Findings Past Medical History: No date: A-fib (HCC) No date: Anemia No date: Anginal pain (HCC) No date: Anxiety No date: Arthritis No date: Asthma No date: CAD (coronary artery disease)     Comment:  a. 2002 CABGx2 (LIMA->LAD, VG->VG->OM1);  b. 09/2012               DES->OM;  c. 03/2015 PTCA of LAD [facility]) in setting of               atretic LIMA; d. 05/2015 Cath [facility]): nonobs dzs; e. 06/2015              Cath ([facility]): LM nl, LAD 45p/d ISR, 50d, D1/2 small, LCX               50p/d ISR, OM1 70ost, 30 ISR, VG->OM1 50ost, 61m,               LIMA->LAD 99p/d - atretic, RCA dom, nl; f.cath 10/16:               40-50%(FFR 0.90) pLAD, 75% (FFR 0.77) mLAD  s/p PCI/DES,               oRCA 40% (FFR0.95) No date: Cancer (HCC)     Comment:  SKIN CANCER ON BACK No date: Celiac disease No date: Chronic diastolic CHF (congestive heart failure) (HCC)     Comment:  a. 06/2009 Echo: EF 60-65%, Gr 1 DD, triv AI, mildly dil               LA, nl RV. No date: COPD (chronic obstructive pulmonary disease) (HCC)     Comment:  a. Chronic bronchitis and emphysema. No date: DDD (degenerative disc disease), lumbar No date: Diverticulosis No date: Dysrhythmia No date: Essential hypertension No date: GERD (gastroesophageal reflux disease) No date: History of hiatal hernia No date: History of kidney stones     Comment:  H/O No date: History of tobacco abuse     Comment:  a. Quit 2014. 2002: Myocardial infarction (HCC)     Comment:  4 STENTS No date: Pancreatitis No date: PSVT (paroxysmal supraventricular tachycardia) (HCC)     Comment:  a. 10/2012 Noted on Zio Patch. No date: Sleep apnea     Comment:  LOST CORD TO CPAP -ONLY 02 @ BEDTIME No date: Tubular  adenoma of colon No date: Type II diabetes mellitus (HCC)  Past Surgical History: No date: BYPASS GRAFT 07/12/2015: CARDIAC CATHETERIZATION; N/A     Comment:  rocedure: Left Heart Cath and Cors/Grafts Angiography;                Surgeon: Lyn Records, MD;  Location: [facility] INVASIVE CV               LAB;  Service: Cardiovascular;  Laterality: N/A; 10/07/2015: CARDIAC CATHETERIZATION; Right     Comment:  Procedure: Left Heart Cath and Cors/Grafts Angiography;               Surgeon: Laurier Nancy, MD;  Location: [facility] INVASIVE CV               LAB;  Service: Cardiovascular;  Laterality: Right; 04/06/2016: CARDIAC CATHETERIZATION; N/A     Comment:  Procedure: Left Heart Cath and Coronary Angiography;                Surgeon: Alwyn Pea, MD;  Location: [facility] INVASIVE               CV LAB;  Service: Cardiovascular;  Laterality: N/A; 04/06/2016: CARDIAC CATHETERIZATION     Comment:  Procedure: Bypass Graft  Angiography;  Surgeon: Alwyn Pea, MD;  Location: [facility] INVASIVE CV LAB;  Service:               Cardiovascular;; 11/02/2016: CARDIAC CATHETERIZATION; N/A     Comment:  Procedure: Left Heart Cath and Cors/Grafts Angiography               and possible PCI;  Surgeon: Alwyn Pea, MD;                Location: [facility] INVASIVE CV LAB;  Service: Cardiovascular;              Laterality: N/A; 11/02/2016: CARDIAC CATHETERIZATION; N/A     Comment:  Procedure: Coronary Stent Intervention;  Surgeon: Alwyn Pea, MD;  Location: [facility] INVASIVE CV LAB;                Service: Cardiovascular;  Laterality: N/A; No date: CHOLECYSTECTOMY 06/09/2019: CIRCUMCISION; N/A     Comment:  Procedure: CIRCUMCISION ADULT;  Surgeon: Sondra Come, MD;  Location: [facility] ORS;  Service: Urology;                Laterality: N/A; 04/01/2018: COLONOSCOPY WITH PROPOFOL; N/A     Comment:  Procedure: COLONOSCOPY WITH PROPOFOL;  Surgeon: Scot Jun, MD;  Location: [facility] ENDOSCOPY;  Service:               Endoscopy;  Laterality: N/A; No date: ESOPHAGEAL DILATION 04/01/2018: ESOPHAGOGASTRODUODENOSCOPY (EGD) WITH PROPOFOL; N/A     Comment:  Procedure: ESOPHAGOGASTRODUODENOSCOPY (EGD) WITH               PROPOFOL;  Surgeon: Scot Jun, MD;  Location:               [facility] ENDOSCOPY;  Service: Endoscopy;  Laterality: N/A; 06/12/2019: LEFT HEART CATH AND CORS/GRAFTS ANGIOGRAPHY; N/A     Comment:  Procedure: LEFT HEART CATH  AND CORS/GRAFTS ANGIOGRAPHY;               Surgeon: Dalia Heading, MD;  Location: [facility] INVASIVE CV              LAB;  Service: Cardiovascular;  Laterality: N/A; 03/11/2020: LEFT HEART CATH AND CORS/GRAFTS ANGIOGRAPHY; N/A     Comment:  Procedure: LEFT HEART CATH AND CORS/GRAFTS ANGIOGRAPHY;               Surgeon: Marcina Millard, MD;  Location: [facility]               INVASIVE CV LAB;  Service: Cardiovascular;  Laterality:                N/A; 05/01/2021: LEFT HEART CATH AND CORS/GRAFTS ANGIOGRAPHY; N/A     Comment:  Procedure: LEFT HEART CATH AND CORS/GRAFTS ANGIOGRAPHY;               Surgeon: Lamar Blinks, MD;  Location: [facility] INVASIVE               CV LAB;  Service: Cardiovascular;  Laterality: N/A; No date: TONSILLECTOMY No date: VASCULAR SURGERY  BMI    Body Mass Index: 29.48 kg/m      Reproductive/Obstetrics negative OB ROS                              Anesthesia Physical Anesthesia Plan  ASA: 4  Anesthesia Plan: General   Post-op Pain Management: Minimal or no pain anticipated   Induction: Intravenous  PONV Risk Score and Plan: 2 and Propofol infusion, TIVA and Ondansetron  Airway Management Planned: Nasal Cannula  Additional Equipment: None  Intra-op Plan:   Post-operative Plan:   Informed Consent: I have reviewed the patients History and Physical, chart, labs and discussed the procedure including the risks, benefits and alternatives for the proposed anesthesia with the patient or authorized representative who has indicated his/her understanding and acceptance.   Patient has DNR.  Discussed DNR with patient and Suspend DNR.   Dental advisory given  Plan Discussed with: CRNA and Surgeon  Anesthesia Plan Comments: (Patient denies any vomiting. Presented with lethargy.  Discussed risks of anesthesia with patient, including possibility of difficulty with spontaneous ventilation under anesthesia necessitating airway intervention, PONV, and rare risks such as cardiac or respiratory or neurological events, and allergic reactions. Discussed the role of CRNA in patient's perioperative care. Patient understands. Discussed DNR, patient wishes to "bring me back if it looks like you can". Will suspend DNR perioperatively.)         Anesthesia Quick Evaluation

## 2023-05-01 NOTE — Anesthesia Postprocedure Evaluation (Signed)
Anesthesia Post Note  Patient: Lawrence [name]  Procedure(s) Performed: ESOPHAGOGASTRODUODENOSCOPY (EGD) WITH PROPOFOL COLONOSCOPY WITH PROPOFOL  Patient location during evaluation: Endoscopy Anesthesia Type: General Level of consciousness: awake and alert Pain management: pain level controlled Vital Signs Assessment: post-procedure vital signs reviewed and stable Respiratory status: spontaneous breathing, nonlabored ventilation, respiratory function stable and patient connected to nasal cannula oxygen Cardiovascular status: blood pressure returned to baseline and stable Postop Assessment: no apparent nausea or vomiting Anesthetic complications: no   No notable events documented.   Last Vitals:  Vitals:   05/01/23 1409 05/01/23 1415  BP: 96/73 111/80  Pulse: 82 74  Resp: (!) 24 17  Temp:  (!) 36.3 C  SpO2: 97% 96%    Last Pain:  Vitals:   05/01/23 1405  TempSrc:   PainSc: Asleep                 Corinda Gubler

## 2023-05-01 NOTE — Progress Notes (Signed)
[facility] Progress Note  Patient: Lawrence [name]    ZOX:[id number]  DOA: 04/27/2023     Date of Service: the patient was seen and examined on 05/01/2023  Chief Complaint  Patient presents with   Fall   Weakness   Brief hospital course:  Lawrence [name] is a 67 y.o. male with past medical history of CAD status post STEMI, chronic angina, heart failure with preserved EF, A-fib, COPD, here with epigastric discomfort and generalized weakness.  The patient states that today, he woke up, and essentially is unable to support himself due to weakness.    BNP was elevated at 1093.5, high sensitive troponin was 17, lactic acid was 1.7 on repeat was 1.3.   Assessment and Plan: Acute on chronic congestive heart failure diastolic -- came in with nonspecific symptoms of generalized weakness not able to get around elevated BNP --  IV Lasix 40 mg bid-->8L UOP--change to po torsemide  -- patient on oxygen--wean as tolerated -- continue cardiac meds -- patient follows with [facility] cardiology BNP 1093, elevated Follow repeat 2D echocardiogram   Acute on chronic anemia -- hemoglobin 14.8--15.2 -- came in with hemoglobin of 11.2 -- patient unable to give any history of dark stools -- on eliquis for a fib--- now holding -- G.I. consultation with Dr. Vanga--EGD/Colonscopy tomorrow 6/15 -- transfuse as needed Continue PPI IV infusion for 72 hours, followed by 40 mg IV twice daily 6/15 Hb 13.9     Iron deficiency, transferrin saturation 7% Continue oral iron supplement  Paroxysmal a fib -- resumed amiodarone -- hold eliquis for now   CAD -- continue statins,ranexa -- hold aspirin and lisinopril-- soft BP   COPD -- stable   PT OT to see patient due to generalized weakness  Body mass index is 29.42 kg/m.  Interventions:     Diet: Clear liquid diet DVT Prophylaxis: SCD, pharmacological prophylaxis contraindicated due to anemia possible GI bleeding    Advance goals of care  discussion: DNR  Family Communication: family was not present at bedside, at the time of interview.  The pt provided permission to discuss medical plan with the family. Opportunity was given to ask question and all questions were answered satisfactorily.   Disposition:  Pt is from Home recently discharged from [facility], admitted with symptomatic anemia, GI consulted, EGD and colonoscopy scheduled today on 6/15, which precludes a safe discharge. Discharge to Home w/HH vs SNF [facility] TBD after PT/OT eval, when cleared by GI. Follow TOC for discharge planning   Subjective: No significant events overnight, patient was laying comfortably, he is aware for the scope around 1 PM today.  Denied any abdominal pain, patient is feeling little bit nauseous, no vomiting.  Denies any chest pain or palpitation, no shortness of breath.   Physical Exam: General: NAD, lying comfortably Appear in no distress, affect appropriate Eyes: PERRLA ENT: Oral Mucosa Clear, moist  Neck: no JVD,  Cardiovascular: Irregular rhythm, no Murmur,  Respiratory: good respiratory effort, Bilateral Air entry equal and Decreased, no Crackles, no wheezes Abdomen: Bowel Sound present, Soft and no tenderness,  Skin: no rashes Extremities: no Pedal edema, no calf tenderness Neurologic: without any new focal findings Gait not checked due to patient safety concerns  Vitals:   05/01/23 0404 05/01/23 0405 05/01/23 0752 05/01/23 0820  BP: 135/86   (!) 143/93  Pulse: 71   65  Resp: 18   18  Temp: 97.7 F (36.5 C)   97.8 F (36.6 C)  TempSrc:    Axillary  SpO2: 99%  97% 96%  Weight:  83.1 kg    Height:        Intake/Output Summary (Last 24 hours) at 05/01/2023 1226 Last data filed at 05/01/2023 0406 Gross per 24 hour  Intake 1911.81 ml  Output 100 ml  Net 1811.81 ml   Filed Weights   04/27/23 1159 04/30/23 0342 05/01/23 0405  Weight: 81.6 kg 85.2 kg 83.1 kg    Data Reviewed: I have personally reviewed and  interpreted daily labs, tele strips, imagings as discussed above. I reviewed all nursing notes, pharmacy notes, vitals, pertinent old records I have discussed plan of care as described above with RN and patient/family.  CBC: Recent Labs  Lab 04/27/23 1201 04/28/23 0640 05/01/23 0357  WBC 6.9 6.3 5.3  HGB 11.7* 11.7* 13.9  HCT 38.3* 37.7* 44.3  MCV 88.5 87.5 87.4  PLT 118* 135* 173   Basic Metabolic Panel: Recent Labs  Lab 04/27/23 1201 04/28/23 0640 05/01/23 0357  NA 140 141 144  K 3.6 3.4* 3.7  CL 110 102 100  CO2 21* 30 35*  GLUCOSE 112* 88 114*  BUN 16 16 8   CREATININE 0.89 0.88 0.88  CALCIUM 7.6* 7.9* 8.9  MG  --   --  1.8  PHOS  --   --  3.8    Studies: No results found.  Scheduled Meds:  amiodarone  200 mg Oral BID   arformoterol  15 mcg Nebulization BID   And   umeclidinium bromide  1 puff Inhalation Daily   atorvastatin  80 mg Oral Daily   budesonide  2 mL Inhalation BID   insulin aspart  0-15 Units Subcutaneous TID WC   insulin aspart  0-5 Units Subcutaneous QHS   iron polysaccharides  150 mg Oral Daily   pantoprazole  40 mg Intravenous Q12H   ranolazine  1,000 mg Oral BID   torsemide  50 mg Oral Daily   traZODone  150 mg Oral QHS   venlafaxine XR  75 mg Oral Q breakfast   Continuous Infusions:  sodium chloride     PRN Meds: acetaminophen **OR** acetaminophen, ALPRAZolam, hydrALAZINE, meclizine, melatonin, nitroGLYCERIN, ondansetron **OR** ondansetron (ZOFRAN) IV, oxyCODONE-acetaminophen **AND** oxyCODONE, senna-docusate  Time spent: 35 minutes  Author: Gillis Santa. MD [facility] 05/01/2023 12:26 PM  To reach On-call, see care teams to locate the attending and reach out to them via [email or web address]. If 7PM-7AM, please contact night-coverage If you still have difficulty reaching the attending provider, please page the [facility] (Director on Call) for [facility] on amion for assistance.

## 2023-05-01 NOTE — Anesthesia Procedure Notes (Signed)
Date/Time: 05/01/2023 1:37 PM  Performed by: Stormy Fabian, CRNAPre-anesthesia Checklist: Patient identified, Emergency Drugs available, Suction available and Patient being monitored Patient Re-evaluated:Patient Re-evaluated prior to induction Oxygen Delivery Method: Nasal cannula Induction Type: IV induction Dental Injury: Teeth and Oropharynx as per pre-operative assessment  Comments: Nasal cannula with etCO2 monitoring

## 2023-05-01 NOTE — Op Note (Signed)
[facility] Gastroenterology Patient Name: Lawrence [name] Procedure Date: 05/01/2023 1:09 PM MRN: [id number] Account #: [id number] Date of Birth: Feb 02, 1956 Admit Type: Outpatient Age: 67 Room: [facility] ENDO ROOM 4 Gender: Male Note Status: Finalized Instrument Name: Laurette Schimke [id number] Procedure:             Upper GI endoscopy Indications:           Unexplained iron deficiency anemia Providers:             Toney Reil MD, MD Medicines:             General Anesthesia Complications:         No immediate complications. Estimated blood loss: None. Procedure:             Pre-Anesthesia Assessment:                        - Prior to the procedure, a History and Physical was                         performed, and patient medications and allergies were                         reviewed. The risks and benefits of the procedure and                         the sedation options and risks were discussed with the                         patient. All questions were answered and informed                         consent was obtained. Patient identification and                         proposed procedure were verified by the physician, the                         nurse, the anesthesiologist, the anesthetist and the                         technician in the pre-procedure area in the procedure                         room in the endoscopy suite. Mental Status                         Examination: alert and oriented. Airway Examination:                         normal oropharyngeal airway and neck mobility.                         Respiratory Examination: clear to auscultation. CV                         Examination: normal. Prophylactic Antibiotics: The                         patient does not  require prophylactic antibiotics.                         Prior Anticoagulants: The patient has taken Eliquis                         (apixaban), last dose was 4 days prior to procedure.                          ASA Grade Assessment: III - A patient with severe                         systemic disease. After reviewing the risks and                         benefits, the patient was deemed in satisfactory                         condition to undergo the procedure. The anesthesia                         plan was to use general anesthesia. Immediately prior                         to administration of medications, the patient was                         re-assessed for adequacy to receive sedatives. The                         heart rate, respiratory rate, oxygen saturations,                         blood pressure, adequacy of pulmonary ventilation, and                         response to care were monitored throughout the                         procedure. The physical status of the patient was                         re-assessed after the procedure.                        After obtaining informed consent, the endoscope was                         passed under direct vision. Throughout the procedure,                         the patient's blood pressure, pulse, and oxygen                         saturations were monitored continuously. The Endoscope                         was introduced through the mouth, and advanced to the  second part of duodenum. The upper GI endoscopy was                         accomplished without difficulty. The patient tolerated                         the procedure well. Findings:      The duodenal bulb and second portion of the duodenum were normal.      Striped mildly erythematous mucosa without bleeding was found in the       gastric body and in the gastric antrum. Biopsies were taken with a cold       forceps for histology.      The incisura was normal. Biopsies were taken with a cold forceps for       histology.      The cardia and gastric fundus were normal on retroflexion.      Esophagogastric landmarks were identified: the  gastroesophageal junction       was found at 40 cm from the incisors.      The gastroesophageal junction and examined esophagus were normal. Impression:            - Normal duodenal bulb and second portion of the                         duodenum.                        - Erythematous mucosa in the gastric body and antrum.                         Biopsied.                        - Normal incisura. Biopsied.                        - Esophagogastric landmarks identified.                        - Normal gastroesophageal junction and esophagus. Recommendation:        - Await pathology results.                        - Proceed with colonoscopy as scheduled                        See colonoscopy report Dr. Libby Maw Toney Reil MD, MD 05/01/2023 1:42:22 PM This report has been signed electronically. Number of Addenda: 0 Note Initiated On: 05/01/2023 1:09 PM Estimated Blood Loss:  Estimated blood loss: none.      [facility]

## 2023-05-01 NOTE — Transfer of Care (Signed)
Immediate Anesthesia Transfer of Care Note  Patient: Lawrence [name]  Procedure(s) Performed: ESOPHAGOGASTRODUODENOSCOPY (EGD) WITH PROPOFOL COLONOSCOPY WITH PROPOFOL  Patient Location: PACU  Anesthesia Type:General  Level of Consciousness: drowsy  Airway & Oxygen Therapy: Patient Spontanous Breathing and Patient connected to nasal cannula oxygen  Post-op Assessment: Report given to RN  Post vital signs: Reviewed and stable  Last Vitals:  Vitals Value Taken Time  BP 96/73 05/01/23 1408  Temp 36.4 C 05/01/23 1405  Pulse 73 05/01/23 1411  Resp 13 05/01/23 1411  SpO2 98 % 05/01/23 1411  Vitals shown include unvalidated device data.  Last Pain:  Vitals:   05/01/23 1405  TempSrc:   PainSc: Asleep      Patients Stated Pain Goal: 2 (05/01/23 0640)  Complications: No notable events documented.

## 2023-05-01 NOTE — Progress Notes (Signed)
  Echocardiogram 2D Echocardiogram has been performed. Definity IV ultrasound imaging agent used on this study.  Lenor Coffin 05/01/2023, 11:58 AM

## 2023-05-01 NOTE — Op Note (Signed)
[facility] Gastroenterology Patient Name: Lawrence [name] Procedure Date: 05/01/2023 1:09 PM MRN: [id number] Account #: [id number] Date of Birth: 02-21-1956 Admit Type: Outpatient Age: 67 Room: [facility] ENDO ROOM 4 Gender: Male Note Status: Finalized Instrument Name: Colonoscope [id number] Procedure:             Colonoscopy Indications:           Last colonoscopy: May 2019, Unexplained iron                         deficiency anemia Providers:             Toney Reil MD, MD Medicines:             General Anesthesia Complications:         No immediate complications. Estimated blood loss: None. Procedure:             Pre-Anesthesia Assessment:                        - Prior to the procedure, a History and Physical was                         performed, and patient medications and allergies were                         reviewed. The patient is competent. The risks and                         benefits of the procedure and the sedation options and                         risks were discussed with the patient. All questions                         were answered and informed consent was obtained.                         Patient identification and proposed procedure were                         verified by the physician, the nurse, the                         anesthesiologist, the anesthetist and the technician                         in the pre-procedure area in the procedure room in the                         endoscopy suite. Mental Status Examination: alert and                         oriented. Airway Examination: normal oropharyngeal                         airway and neck mobility. Respiratory Examination:                         clear to auscultation. CV Examination: normal.  Prophylactic Antibiotics: The patient does not require                         prophylactic antibiotics. Prior Anticoagulants: The                         patient has  taken Eliquis (apixaban), last dose was 4                         days prior to procedure. ASA Grade Assessment: III - A                         patient with severe systemic disease. After reviewing                         the risks and benefits, the patient was deemed in                         satisfactory condition to undergo the procedure. The                         anesthesia plan was to use general anesthesia.                         Immediately prior to administration of medications,                         the patient was re-assessed for adequacy to receive                         sedatives. The heart rate, respiratory rate, oxygen                         saturations, blood pressure, adequacy of pulmonary                         ventilation, and response to care were monitored                         throughout the procedure. The physical status of the                         patient was re-assessed after the procedure.                        After obtaining informed consent, the colonoscope was                         passed under direct vision. Throughout the procedure,                         the patient's blood pressure, pulse, and oxygen                         saturations were monitored continuously. The                         Colonoscope was introduced through the anus and  advanced to the the cecum, identified by appendiceal                         orifice and ileocecal valve. The colonoscopy was                         performed with moderate difficulty due to inadequate                         bowel prep, significant looping and the patient's body                         habitus. Successful completion of the procedure was                         aided by applying abdominal pressure. The patient                         tolerated the procedure well. The quality of the bowel                         preparation was poor. The ileocecal valve, appendiceal                          orifice, and rectum were photographed. Findings:      The perianal and digital rectal examinations were normal. Pertinent       negatives include normal sphincter tone and no palpable rectal lesions.      Copious quantities of semi-liquid stool was found in the transverse       colon, in the ascending colon and in the cecum, precluding visualization.      Normal mucosa was found in the entire examined colon. Impression:            - Preparation of the colon was poor.                        - Stool in the transverse colon, in the ascending                         colon and in the cecum.                        - Normal mucosa in the entire examined colon.                        - No specimens collected. Recommendation:        - Return patient to hospital ward for ongoing care.                        - Cardiac diet today.                        - Continue present medications.                        - To visualize the small bowel, perform video capsule  endoscopy today.                        - Resume Eliquis (apixaban) at prior dose tomorrow.                         Refer to managing physician for further adjustment of                         therapy. Dr. Libby Maw Toney Reil MD, MD 05/01/2023 2:00:55 PM This report has been signed electronically. Number of Addenda: 0 Note Initiated On: 05/01/2023 1:09 PM Scope Withdrawal Time: 0 hours 3 minutes 32 seconds  Total Procedure Duration: 0 hours 11 minutes 33 seconds  Estimated Blood Loss:  Estimated blood loss: none.      [facility]

## 2023-05-02 DIAGNOSIS — D62 Acute posthemorrhagic anemia: Secondary | ICD-10-CM | POA: Diagnosis not present

## 2023-05-02 DIAGNOSIS — D649 Anemia, unspecified: Secondary | ICD-10-CM | POA: Diagnosis not present

## 2023-05-02 LAB — GLUCOSE, CAPILLARY
Glucose-Capillary: 129 mg/dL — ABNORMAL HIGH (ref 70–99)
Glucose-Capillary: 168 mg/dL — ABNORMAL HIGH (ref 70–99)
Glucose-Capillary: 153 mg/dL — ABNORMAL HIGH (ref 70–99)
Glucose-Capillary: 181 mg/dL — ABNORMAL HIGH (ref 70–99)

## 2023-05-02 LAB — BASIC METABOLIC PANEL
Anion gap: 9 (ref 5–15)
BUN: 11 mg/dL (ref 8–23)
CO2: 36 mmol/L — ABNORMAL HIGH (ref 22–32)
Calcium: 8.8 mg/dL — ABNORMAL LOW (ref 8.9–10.3)
Chloride: 92 mmol/L — ABNORMAL LOW (ref 98–111)
Creatinine, Ser: 1 mg/dL (ref 0.61–1.24)
GFR, Estimated: >60 mL/min (ref >60)
Glucose, Bld: 137 mg/dL — ABNORMAL HIGH (ref 70–99)
Potassium: 3.7 mmol/L (ref 3.5–5.1)
Sodium: 137 mmol/L (ref 135–145)

## 2023-05-02 LAB — CBC
HCT: 45.1 % (ref 39.0–52.0)
Hemoglobin: 13.6 g/dL (ref 13.0–17.0)
MCH: 26.7 pg (ref 26.0–34.0)
MCHC: 30.2 g/dL (ref 30.0–36.0)
MCV: 88.4 fL (ref 80.0–100.0)
Platelets: 161 10*3/uL (ref 150–400)
RBC: 5.1 MIL/uL (ref 4.22–5.81)
RDW: 16.4 % — ABNORMAL HIGH (ref 11.5–15.5)
WBC: 6.1 10*3/uL (ref 4.0–10.5)
nRBC: 0 % (ref 0.0–0.2)

## 2023-05-02 LAB — APTT: aPTT: 34 seconds (ref 24–36)

## 2023-05-02 LAB — HEPARIN LEVEL (UNFRACTIONATED)
Heparin Unfractionated: <0.1 IU/mL — ABNORMAL LOW (ref 0.30–0.70)
Heparin Unfractionated: 0.22 IU/mL — ABNORMAL LOW (ref 0.30–0.70)
Heparin Unfractionated: 0.32 IU/mL (ref 0.30–0.70)
Heparin Unfractionated: 0.24 IU/mL — ABNORMAL LOW (ref 0.30–0.70)

## 2023-05-02 LAB — MAGNESIUM: Magnesium: 1.6 mg/dL — ABNORMAL LOW (ref 1.7–2.4)

## 2023-05-02 LAB — VITAMIN D 25 HYDROXY (VIT D DEFICIENCY, FRACTURES): Vit D, 25-Hydroxy: 33.07 ng/mL (ref 30–100)

## 2023-05-02 LAB — PROTIME-INR
INR: 1.2 (ref 0.8–1.2)
Prothrombin Time: 15.6 seconds — ABNORMAL HIGH (ref 11.4–15.2)

## 2023-05-02 LAB — TROPONIN I (HIGH SENSITIVITY): Troponin I (High Sensitivity): 9192 ng/L (ref <18)

## 2023-05-02 LAB — PHOSPHORUS: Phosphorus: 3.8 mg/dL (ref 2.5–4.6)

## 2023-05-02 MED ORDER — HEPARIN BOLUS VIA INFUSION
4000.0000 [IU] | Freq: Once | INTRAVENOUS | Status: AC
  Administered 2023-05-02: 4000 [IU] via INTRAVENOUS
  Filled 2023-05-02: qty 4000

## 2023-05-02 MED ORDER — HEPARIN (PORCINE) 25000 UT/250ML-% IV SOLN
1400.0000 [IU]/h | INTRAVENOUS | Status: DC
  Administered 2023-05-02: 1250 [IU]/h via INTRAVENOUS
  Administered 2023-05-02: 1100 [IU]/h via INTRAVENOUS
  Filled 2023-05-02 (×2): qty 250

## 2023-05-02 MED ORDER — HEPARIN BOLUS VIA INFUSION
1200.0000 [IU] | Freq: Once | INTRAVENOUS | Status: AC
  Administered 2023-05-02: 1200 [IU] via INTRAVENOUS
  Filled 2023-05-02: qty 1200

## 2023-05-02 MED ORDER — NITROGLYCERIN IN D5W 200-5 MCG/ML-% IV SOLN
0.0000 ug/min | INTRAVENOUS | Status: DC
  Administered 2023-05-02: 10 ug/min via INTRAVENOUS
  Administered 2023-05-02: 5 ug/min via INTRAVENOUS
  Filled 2023-05-02: qty 250

## 2023-05-02 MED ORDER — MORPHINE SULFATE (PF) 2 MG/ML IV SOLN
2.0000 mg | INTRAVENOUS | Status: DC | PRN
  Administered 2023-05-02 – 2023-05-03 (×6): 2 mg via INTRAVENOUS
  Filled 2023-05-02 (×6): qty 1

## 2023-05-02 MED ORDER — PANTOPRAZOLE SODIUM 40 MG PO TBEC
40.0000 mg | DELAYED_RELEASE_TABLET | Freq: Every day | ORAL | Status: DC
  Administered 2023-05-03 – 2023-05-04 (×2): 40 mg via ORAL
  Filled 2023-05-02 (×2): qty 1

## 2023-05-02 MED ORDER — MAGNESIUM SULFATE 2 GM/50ML IV SOLN
2.0000 g | Freq: Once | INTRAVENOUS | Status: AC
  Administered 2023-05-02: 2 g via INTRAVENOUS
  Filled 2023-05-02: qty 50

## 2023-05-02 NOTE — Progress Notes (Signed)
       CROSS COVER NOTE  NAME: Lawrence [name] MRN: [id number] DOB : 1956-01-28    Concern as stated by nurse / staff    Patient with chest pain onset approximately 2243     Pertinent findings on chart review: Patient in with weakness and CHF exacerbation. BNP elevation on admit 1093 with normal 17, 19 Chest pain not resolved with SL nitroglycerin and only slight improvement with morphine reported by patient.  Work up for GI bleed - no bleeding found with upper GI, poor prep for colonoscopy to be diagnostic and eliquis was safe to resume on 6/16 per Dr Allegra Lai note   Assessment and  Interventions   Assessment: Chest pain left chest described as shooting sharp and radiating down left arm Troponin - 40981, 9192 EKG reviewed by me - atrial fib, no ST elevation or ischemic changes Plan: Heparin drip nitroglycerin drip  Consult cardiology - chat message sent to DR [facility] (sees Dr Juliann Pares outpaitent also)       Donnie Mesa NP [facility] Cross Cover 7pm-7am - check amion for availability Pager [phone]

## 2023-05-02 NOTE — Progress Notes (Signed)
[facility] Progress Note  Patient: Lawrence [name]    ZOX:[id number]  DOA: 04/27/2023     Date of Service: the patient was seen and examined on 05/02/2023  Chief Complaint  Patient presents with   Fall   Weakness   Brief hospital course:  Lawrence [name] is a 67 y.o. male with past medical history of CAD status post STEMI, chronic angina, heart failure with preserved EF, A-fib, COPD, here with epigastric discomfort and generalized weakness.  The patient states that today, he woke up, and essentially is unable to support himself due to weakness.    BNP was elevated at 1093.5, high sensitive troponin was 17, lactic acid was 1.7 on repeat was 1.3.   Assessment and Plan:  # Non-STEMI developed chest pain at night on 6/15 H/o CAD, continue statins and ranexa -- hold aspirin and lisinopril-- soft BP No significant EKG changes Troponin peaked 81191 -- 9192 trending down Continue to monitor on telemetry Continue heparin IV infusion for 48 to 72 hours  continue nitro infusion for now and transition to Imdur after improvement Continue morphine IV as needed Cardiology consult appreciated, recommended medical management, if no resolution of chest pain then patient may need cardiac cath TTE: LVEF 50 to 55%, grade 3 diastolic dysfunction.  No regional wall motion abnormality.   # Acute on chronic diastolic heart failure  Presented with nonspecific symptoms of generalized weakness not able to get around elevated BNP 1093 S/p IV Lasix 40 mg bid-->8L UOP--change to po torsemide  -- patient on oxygen--wean as tolerated -- continue cardiac meds   # Acute on chronic anemia Baseline hemoglobin 14.8--15.2 Presented with hemoglobin of 11.2 patient unable to give any history of dark stools on eliquis for a fib--- now holding S/p PPI IV infusion for 72 hours. Started PPI 40 pod,  H&H remained stable GI consulted s/p EGD normal, biopsy taken, s/p colonoscopy, poor prep, normal mucosa, no  biopsy taken. S/p capsule endoscopy: Study is complete, capsule reached cecum Normal small bowel transit time. 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 Ok to resume eliquis Push enteroscopy to be scheduled as outpt 6/16 Hb 13.6 stable   # Hypomagnesemia, mag repleted. Monitor electrolytes   # iron deficiency, transferrin saturation 7% Continue oral iron supplement  # Paroxysmal a fib -- resumed amiodarone -- hold eliquis for now, resume when heparin infusion will be discontinued    # COPD, stable   PT OT to see patient due to generalized weakness  Body mass index is 29.42 kg/m.  Interventions:     Diet: Carb modified diet DVT Prophylaxis: Therapeutic Anticoagulation with heparin IV infusion    Advance goals of care discussion: DNR  Family Communication: family was not present at bedside, at the time of interview.  The pt provided permission to discuss medical plan with the family. Opportunity was given to ask question and all questions were answered satisfactorily.   Disposition:  Pt is from Home recently discharged from [facility], admitted with symptomatic anemia, GI consulted, EGD and colonoscopy scheduled today on 6/15, which precludes a safe discharge. Discharge to Home w/HH vs SNF [facility] TBD after PT/OT eval, when cleared by GI. Follow TOC for discharge planning   Subjective: Overnight patient had chest pain, which is gradually improving but is still patient has significant pain 7/10 requesting morphine IV.  Denies any  palpitations.  Denies any abdominal pain, no GI bleeding.   Physical Exam: General: NAD, lying comfortably Appear in no distress, affect appropriate Eyes: PERRLA ENT: Oral Mucosa Clear, moist  Neck: no JVD,  Cardiovascular: Irregular rhythm, no Murmur,  Respiratory: good respiratory effort, Bilateral Air  entry equal and Decreased, no Crackles, no wheezes Abdomen: Bowel Sound present, Soft and no tenderness,  Skin: no rashes Extremities: no Pedal edema, no calf tenderness Neurologic: without any new focal findings Gait not checked due to patient safety concerns  Vitals:   05/02/23 1000 05/02/23 1100 05/02/23 1249 05/02/23 1429  BP:      Pulse:   68   Resp: (!) 37 13    Temp:   97.7 F (36.5 C) (!) 97.5 F (36.4 C)  TempSrc:   Oral Oral  SpO2:   97%   Weight:      Height:        Intake/Output Summary (Last 24 hours) at 05/02/2023 1538 Last data filed at 05/02/2023 1345 Gross per 24 hour  Intake 786.37 ml  Output 2200 ml  Net -1413.63 ml   Filed Weights   04/27/23 1159 04/30/23 0342 05/01/23 0405  Weight: 81.6 kg 85.2 kg 83.1 kg    Data Reviewed: I have personally reviewed and interpreted daily labs, tele strips, imagings as discussed above. I reviewed all nursing notes, pharmacy notes, vitals, pertinent old records I have discussed plan of care as described above with RN and patient/family.  CBC: Recent Labs  Lab 04/27/23 1201 04/28/23 0640 05/01/23 0357 05/02/23 0718  WBC 6.9 6.3 5.3 6.1  HGB 11.7* 11.7* 13.9 13.6  HCT 38.3* 37.7* 44.3 45.1  MCV 88.5 87.5 87.4 88.4  PLT 118* 135* 173 161   Basic Metabolic Panel: Recent Labs  Lab 04/27/23 1201 04/28/23 0640 05/01/23 0357 05/02/23 0718  NA 140 141 144 137  K 3.6 3.4* 3.7 3.7  CL 110 102 100 92*  CO2 21* 30 35* 36*  GLUCOSE 112* 88 114* 137*  BUN 16 16 8 11   CREATININE 0.89 0.88 0.88 1.00  CALCIUM 7.6* 7.9* 8.9 8.8*  MG  --   --  1.8 1.6*  PHOS  --   --  3.8 3.8    Studies: No results found.  Scheduled Meds:  amiodarone  200 mg Oral BID   arformoterol  15 mcg Nebulization BID   And   umeclidinium bromide  1 puff Inhalation Daily   atorvastatin  80 mg Oral Daily   budesonide  2 mL Inhalation BID   insulin aspart  0-15 Units Subcutaneous TID WC   insulin aspart  0-5 Units Subcutaneous QHS    iron polysaccharides  150 mg Oral Daily   pantoprazole  40 mg Intravenous Q12H   ranolazine  1,000 mg Oral BID   torsemide  50 mg Oral Daily   traZODone  150 mg Oral QHS   venlafaxine XR  75 mg Oral Q breakfast   Continuous Infusions:  heparin 1,250 Units/hr (05/02/23 0906)   nitroGLYCERIN 10 mcg/min (05/02/23 1426)   PRN Meds: acetaminophen **OR** acetaminophen, ALPRAZolam, ipratropium-albuterol, meclizine, melatonin, morphine injection, nitroGLYCERIN, ondansetron **OR** ondansetron (ZOFRAN) IV, oxyCODONE-acetaminophen **AND** oxyCODONE, senna-docusate  Time spent: 50 minutes  Author: Gillis Santa. MD [facility] 05/02/2023 3:38 PM  To reach On-call, see care teams to locate the attending and reach out to them via [email or web address]. If 7PM-7AM, please contact night-coverage If you still have difficulty reaching the attending provider, please page the [facility] (Director on Call)  for [facility] on amion for assistance.

## 2023-05-02 NOTE — Progress Notes (Signed)
ANTICOAGULATION CONSULT NOTE  Pharmacy Consult for heparin infusion Indication: ACS/STEMI  Allergies  Allergen Reactions   Prednisone Other (See Comments) and Hypertension    Pt states that this medication puts him in A-fib    Demerol  [Meperidine Hcl]    Demerol [Meperidine] Hives   Jardiance [Empagliflozin] Other (See Comments)    Perineal pain   Sulfa Antibiotics Hives   Albuterol Sulfate [Albuterol] Palpitations and Other (See Comments)    Pt currently uses this medication.     Morphine Sulfate Nausea And Vomiting, Rash and Other (See Comments)    Pt states that he is only allergic to the tablet form of this medication.      Patient Measurements: Height: 5\' 7"  (170.2 cm) Weight: 83.1 kg (183 lb 3.2 oz) IBW/kg (Calculated) : 66.1 Heparin Dosing Weight: 83 kg  Vital Signs: Temp: 97.9 F (36.6 C) (06/15 2304) Temp Source: Oral (06/15 2304) BP: 165/95 (06/15 2304) Pulse Rate: 78 (06/15 2304)  Labs: Recent Labs    05/01/23 0357 05/01/23 2307  HGB 13.9  --   HCT 44.3  --   PLT 173  --   CREATININE 0.88  --   TROPONINIHS  --  10,494*    Estimated Creatinine Clearance: 81.7 mL/min (by C-G formula based on SCr of 0.88 mg/dL).   Medical History: Past Medical History:  Diagnosis Date   A-fib (HCC)    Anemia    Anginal pain (HCC)    Anxiety    Arthritis    Asthma    CAD (coronary artery disease)    a. 2002 CABGx2 (LIMA->LAD, VG->VG->OM1);  b. 09/2012 DES->OM;  c. 03/2015 PTCA of LAD [facility]) in setting of atretic LIMA; d. 05/2015 Cath [facility]): nonobs dzs; e. 06/2015 Cath ([facility]): LM nl, LAD 45p/d ISR, 50d, D1/2 small, LCX 50p/d ISR, OM1 70ost, 30 ISR, VG->OM1 50ost, 16m, LIMA->LAD 99p/d - atretic, RCA dom, nl; f.cath 10/16: 40-50%(FFR 0.90) pLAD, 75% (FFR 0.77) mLAD s/p PCI/DES, oRCA 40% (FFR0.95)   Cancer (HCC)    SKIN CANCER ON BACK   Celiac disease    Chronic diastolic CHF (congestive heart failure) (HCC)    a. 06/2009 Echo: EF 60-65%, Gr 1 DD, triv AI, mildly dil LA,  nl RV.   COPD (chronic obstructive pulmonary disease) (HCC)    a. Chronic bronchitis and emphysema.   DDD (degenerative disc disease), lumbar    Diverticulosis    Dysrhythmia    Essential hypertension    GERD (gastroesophageal reflux disease)    History of hiatal hernia    History of kidney stones    H/O   History of tobacco abuse    a. Quit 2014.   Myocardial infarction [facility]) 2002   4 STENTS   Pancreatitis    PSVT (paroxysmal supraventricular tachycardia)    a. 10/2012 Noted on [facility] Patch.   Sleep apnea    LOST CORD TO CPAP -ONLY 02 @ BEDTIME   Tubular adenoma of colon    Type II diabetes mellitus (HCC)     Medications:  PTA Meds: Apixaban 5 mg BID, last dose unknown  Assessment: Pt is a 67 yo male admitted 6/11 for symptomatic anemia now c/o onset of chest pain, found with elevated Troponin I level.  Goal of Therapy:  Heparin level 0.3-0.7 units/ml Monitor platelets by anticoagulation protocol: Yes   Plan:  Bolus 4000 units x 1 Start heparin infusion at 1100 units/hr Will follow aPTT until correlation w/ HL confirmed Will check aPTT in 6 hr  after start of infusion HL & CBC daily while on heparin  Otelia Sergeant, PharmD, [facility] 05/02/2023 12:42 AM

## 2023-05-02 NOTE — Consult Note (Signed)
CARDIOLOGY CONSULT NOTE               Patient ID: Lawrence [name] MRN: [id number] DOB/AGE: 1956/03/05 67 y.o.  Admit date: 04/27/2023 Referring Physician Gillis Santa MD hospitalist Primary Physician [name], MD primary Primary Cardiologist [facility] Reason for Consultation generalized weakness possible non-STEMI  HPI: 67 year old male multiple medical problems multivessel coronary disease coronary bypass surgery chronic angina diabetes insulin-dependent hypertension history of PCI and stent paroxysmal atrial fibrillation on Eliquis presented with generalized weakness and fatigue was just discharged from rehab facility.  While at [facility] when he got home he felt weak and was unable to stand denied any pain initially has been seen and evaluated for anemia found to have significant upper GI bleeding but has had his recurrent anginal symptoms but troponins elevated to 10,000 chest pain is better now no significant shortness of breath or leg edema  Review of systems complete and found to be negative unless listed above     Past Medical History:  Diagnosis Date   A-fib (HCC)    Anemia    Anginal pain (HCC)    Anxiety    Arthritis    Asthma    CAD (coronary artery disease)    a. 2002 CABGx2 (LIMA->LAD, VG->VG->OM1);  b. 09/2012 DES->OM;  c. 03/2015 PTCA of LAD [facility]) in setting of atretic LIMA; d. 05/2015 Cath [facility]): nonobs dzs; e. 06/2015 Cath ([facility]): LM nl, LAD 45p/d ISR, 50d, D1/2 small, LCX 50p/d ISR, OM1 70ost, 30 ISR, VG->OM1 50ost, 38m, LIMA->LAD 99p/d - atretic, RCA dom, nl; f.cath 10/16: 40-50%(FFR 0.90) pLAD, 75% (FFR 0.77) mLAD s/p PCI/DES, oRCA 40% (FFR0.95)   Cancer (HCC)    SKIN CANCER ON BACK   Celiac disease    Chronic diastolic CHF (congestive heart failure) (HCC)    a. 06/2009 Echo: EF 60-65%, Gr 1 DD, triv AI, mildly dil LA, nl RV.   COPD (chronic obstructive pulmonary disease) (HCC)    a. Chronic bronchitis and emphysema.   DDD (degenerative disc disease), lumbar     Diverticulosis    Dysrhythmia    Essential hypertension    GERD (gastroesophageal reflux disease)    History of hiatal hernia    History of kidney stones    H/O   History of tobacco abuse    a. Quit 2014.   Myocardial infarction [facility]) 2002   4 STENTS   Pancreatitis    PSVT (paroxysmal supraventricular tachycardia)    a. 10/2012 Noted on Zio Patch.   Sleep apnea    LOST CORD TO CPAP -ONLY 02 @ BEDTIME   Tubular adenoma of colon    Type II diabetes mellitus (HCC)     Past Surgical History:  Procedure Laterality Date   BYPASS GRAFT     CARDIAC CATHETERIZATION N/A 07/12/2015   rocedure: Left Heart Cath and Cors/Grafts Angiography;  Surgeon: Lyn Records, MD;  Location: [facility] INVASIVE CV LAB;  Service: Cardiovascular;  Laterality: N/A;   CARDIAC CATHETERIZATION Right 10/07/2015   Procedure: Left Heart Cath and Cors/Grafts Angiography;  Surgeon: Laurier Nancy, MD;  Location: [facility] INVASIVE CV LAB;  Service: Cardiovascular;  Laterality: Right;   CARDIAC CATHETERIZATION N/A 04/06/2016   Procedure: Left Heart Cath and Coronary Angiography;  Surgeon: Alwyn Pea, MD;  Location: [facility] INVASIVE CV LAB;  Service: Cardiovascular;  Laterality: N/A;   CARDIAC CATHETERIZATION  04/06/2016   Procedure: Bypass Graft Angiography;  Surgeon: Alwyn Pea, MD;  Location: [facility] INVASIVE CV LAB;  Service: Cardiovascular;;  CARDIAC CATHETERIZATION N/A 11/02/2016   Procedure: Left Heart Cath and Cors/Grafts Angiography and possible PCI;  Surgeon: Alwyn Pea, MD;  Location: [facility] INVASIVE CV LAB;  Service: Cardiovascular;  Laterality: N/A;   CARDIAC CATHETERIZATION N/A 11/02/2016   Procedure: Coronary Stent Intervention;  Surgeon: Alwyn Pea, MD;  Location: [facility] INVASIVE CV LAB;  Service: Cardiovascular;  Laterality: N/A;   CARDIOVERSION N/A 06/29/2022   Procedure: CARDIOVERSION;  Surgeon: Lamar Blinks, MD;  Location: [facility] ORS;  Service: Cardiovascular;  Laterality: N/A;    CHOLECYSTECTOMY     CIRCUMCISION N/A 06/09/2019   Procedure: CIRCUMCISION ADULT;  Surgeon: Sondra Come, MD;  Location: [facility] ORS;  Service: Urology;  Laterality: N/A;   COLONOSCOPY WITH PROPOFOL N/A 04/01/2018   Procedure: COLONOSCOPY WITH PROPOFOL;  Surgeon: Scot Jun, MD;  Location: [facility] ENDOSCOPY;  Service: Endoscopy;  Laterality: N/A;   ESOPHAGEAL DILATION     ESOPHAGOGASTRODUODENOSCOPY (EGD) WITH PROPOFOL N/A 04/01/2018   Procedure: ESOPHAGOGASTRODUODENOSCOPY (EGD) WITH PROPOFOL;  Surgeon: Scot Jun, MD;  Location: [facility] ENDOSCOPY;  Service: Endoscopy;  Laterality: N/A;   LEFT HEART CATH AND CORS/GRAFTS ANGIOGRAPHY N/A 06/12/2019   Procedure: LEFT HEART CATH AND CORS/GRAFTS ANGIOGRAPHY;  Surgeon: Dalia Heading, MD;  Location: [facility] INVASIVE CV LAB;  Service: Cardiovascular;  Laterality: N/A;   LEFT HEART CATH AND CORS/GRAFTS ANGIOGRAPHY N/A 03/11/2020   Procedure: LEFT HEART CATH AND CORS/GRAFTS ANGIOGRAPHY;  Surgeon: Marcina Millard, MD;  Location: [facility] INVASIVE CV LAB;  Service: Cardiovascular;  Laterality: N/A;   LEFT HEART CATH AND CORS/GRAFTS ANGIOGRAPHY N/A 05/01/2021   Procedure: LEFT HEART CATH AND CORS/GRAFTS ANGIOGRAPHY;  Surgeon: Lamar Blinks, MD;  Location: [facility] INVASIVE CV LAB;  Service: Cardiovascular;  Laterality: N/A;   LEFT HEART CATH AND CORS/GRAFTS ANGIOGRAPHY N/A 11/02/2022   Procedure: LEFT HEART CATH AND CORS/GRAFTS ANGIOGRAPHY;  Surgeon: Alwyn Pea, MD;  Location: [facility] INVASIVE CV LAB;  Service: Cardiovascular;  Laterality: N/A;   TONSILLECTOMY     VASCULAR SURGERY      Medications Prior to Admission  Medication Sig Dispense Refill Last Dose   acetaminophen (TYLENOL) 325 MG tablet Take 2 tablets (650 mg total) by mouth every 4 (four) hours as needed for headache or mild pain.   prn at unk   albuterol (VENTOLIN HFA) 108 (90 Base) MCG/ACT inhaler INHALE 2 PUFFS BY MOUTH EVERY 6 HOURS AS NEEDED FOR SHORTNESS OF BREATH 17 g 5 prn at unk    allopurinol (ZYLOPRIM) 300 MG tablet TAKE 1 TABLET BY MOUTH TWICE A DAY 180 tablet 4 Past Week   ALPRAZolam (XANAX) 1 MG tablet Take 1 mg by mouth 3 (three) times daily as needed.   04/27/2023   amiodarone (PACERONE) 200 MG tablet Take 200 mg by mouth 2 (two) times daily.   04/27/2023   aspirin 81 MG chewable tablet Chew 81 mg by mouth daily. Swallows whole   04/27/2023   atorvastatin (LIPITOR) 80 MG tablet Take 1 tablet (80 mg total) by mouth daily. Please schedule office visit before any future refill. 90 tablet 0 04/27/2023   Budeson-Glycopyrrol-Formoterol (BREZTRI AEROSPHERE) 160-9-4.8 MCG/ACT AERO Inhale 2 puffs into the lungs in the morning and at bedtime. 11.8 g 0 04/26/2023   celecoxib (CELEBREX) 200 MG capsule Take 1 capsule (200 mg total) by mouth 2 (two) times daily as needed. Home med.   Past Week   cetirizine (ZYRTEC) 10 MG tablet TAKE 1 TABLET BY MOUTH AT BEDTIME 90 tablet 1 04/26/2023   cetirizine (ZYRTEC)  5 MG tablet Take 5 mg by mouth daily.      glipiZIDE (GLUCOTROL) 5 MG tablet Take 1 tablet (5 mg total) by mouth daily as needed (sugar over 300). 30 tablet 0 Past Week   isosorbide mononitrate (IMDUR) 60 MG 24 hr tablet Take 1-2 tablets (60-120 mg total) by mouth daily. Home med.   Past Week   lisinopril (ZESTRIL) 20 MG tablet Take 20 mg by mouth daily.   Past Week   meclizine (ANTIVERT) 25 MG tablet TAKE ONE TABLET BY THREE TIMES A DAY AS NEEDED FOR DIZZINESS 30 tablet 5 Past Week   metFORMIN (GLUCOPHAGE-XR) 500 MG 24 hr tablet TAKE 1 TABLET BY MOUTH TWICE A DAY 60 tablet 3 Past Week   methocarbamol (ROBAXIN) 500 MG tablet Take 500 mg by mouth every 8 (eight) hours as needed for muscle spasms. Take 1 tablet by mouth every hour as needed for muscle spasms   Past Week   metoprolol succinate (TOPROL-XL) 100 MG 24 hr tablet Take 100 mg by mouth daily.   Past Week   naloxone (NARCAN) nasal spray 4 mg/0.1 mL 1 spray into nostril x1 and may repeat every 2-3 minutes until patient is responsive  or EMS arrives 2 each 2 prn at unk   nitroGLYCERIN (NITROSTAT) 0.4 MG SL tablet Place 1 tablet (0.4 mg total) under the tongue every 5 (five) minutes as needed for chest pain. 30 tablet 0 prn at unk   omega-3 acid ethyl esters (LOVAZA) 1 g capsule Take 4 capsules (4 g total) by mouth daily. 120 capsule 3 Past Week   omeprazole (PRILOSEC) 40 MG capsule TAKE 1 CAPSULE BY MOUTH 2 TIMES DAILY 30 MINUTES BEFORE BREAKFAST AND DINNER 180 capsule 4 Past Week   oxyCODONE-acetaminophen (PERCOCET) 10-325 MG tablet TAKE 1 TABLET BY MOUTH EVERY 4 HOURS AS NEEDED FOR PAIN 180 tablet 0 04/27/2023   ranolazine (RANEXA) 1000 MG SR tablet Take 1 tablet (1,000 mg total) by mouth 2 (two) times daily. 60 tablet 0 Past Week   torsemide (DEMADEX) 100 MG tablet Take 0.5 tablets (50 mg total) by mouth daily. 30 tablet 0 Past Week   traZODone (DESYREL) 150 MG tablet TAKE 1 TABLET BY MOUTH AT BEDTIME 90 tablet 4 Past Week   venlafaxine XR (EFFEXOR-XR) 75 MG 24 hr capsule TAKE 1 CAPSULE BY MOUTH DAILY WITH BREAKFAST 90 capsule 4 Past Week   zolpidem (AMBIEN) 5 MG tablet Take 1 tablet (5 mg total) by mouth at bedtime as needed for up to 3 days for sleep. SNF use only.  Refills per SNF MD 3 tablet 0 Past Week   Accu-Chek Softclix Lancets lancets Use as instructed to check sugar daily for type 2 diabetes. 100 each 4    Blood Glucose Calibration (ACCU-CHEK GUIDE CONTROL) LIQD Use with blood glucose monitor as directed 1 each 3    Blood Glucose Monitoring Suppl (ACCU-CHEK GUIDE) w/Device KIT Use to check blood sugars as directed 1 kit 0    ELIQUIS 5 MG TABS tablet TAKE 1 TABLET BY MOUTH TWICE A DAY 180 tablet 3    glucose blood (ACCU-CHEK GUIDE) test strip Use as instructed to check sugar daily for type 2 diabetes. 100 strip 4    Social History   Socioeconomic History   Marital status: Widowed    Spouse name: Not on file   Number of children: 1   Years of education: Not on file   Highest education level: 10th grade   Occupational History  Occupation: Disabled   Occupation: on Tree surgeon  Tobacco Use   Smoking status: Former    Packs/day: 3.00    Years: 50.00    Additional pack years: 0.00    Total pack years: 150.00    Types: Cigarettes    Quit date: 04/22/2013    Years since quitting: 10.0   Smokeless tobacco: Never   Tobacco comments:    Reports not smoking for approx 8 years.  Vaping Use   Vaping Use: Never used  Substance and Sexual Activity   Alcohol use: No    Comment: remotely quit alcohol use. Hx of heavy alcohol use.   Drug use: Yes    Types: Marijuana    Comment: occasionally   Sexual activity: Not Currently  Other Topics Concern   Not on file  Social History Narrative   Pt lives in [place] with wife.  Does not routinely exercise.   Social Determinants of Health   Financial Resource Strain: Low Risk  (03/10/2023)   Overall Financial Resource Strain (CARDIA)    Difficulty of Paying Living Expenses: Not hard at all  Food Insecurity: No Food Insecurity (04/28/2023)   Hunger Vital Sign    Worried About Running Out of Food in the Last Year: Never true    Ran Out of Food in the Last Year: Never true  Transportation Needs: No Transportation Needs (04/28/2023)   PRAPARE - Administrator, Civil Service (Medical): No    Lack of Transportation (Non-Medical): No  Physical Activity: Inactive (03/10/2023)   Exercise Vital Sign    Days of Exercise per Week: 0 days    Minutes of Exercise per Session: 0 min  Stress: No Stress Concern Present (03/10/2023)   [employer] of Occupational Health - Occupational Stress Questionnaire    Feeling of Stress : Not at all  Social Connections: Moderately Isolated (03/10/2023)   Social Connection and Isolation Panel [NHANES]    Frequency of Communication with Friends and Family: More than three times a week    Frequency of Social Gatherings with Friends and Family: More than three times a week    Attends Religious Services: More  than 4 times per year    Active Member of [employer] or Organizations: No    Attends Banker Meetings: Not on file    Marital Status: Widowed  Intimate Partner Violence: Not At Risk (04/28/2023)   Humiliation, Afraid, Rape, and Kick questionnaire    Fear of Current or Ex-Partner: No    Emotionally Abused: No    Physically Abused: No    Sexually Abused: No    Family History  Problem Relation Age of Onset   Heart attack Mother    Depression Mother    Heart disease Mother    COPD Mother    Hypertension Mother    Heart attack Father    Diabetes Father    Depression Father    Heart disease Father    Cirrhosis Father    Parkinson's disease Brother       Review of systems complete and found to be negative unless listed above      PHYSICAL EXAM  General: Well developed, well nourished, in no acute distress HEENT:  Normocephalic and atramatic Neck:  No JVD.  Lungs: Clear bilaterally to auscultation and percussion. Heart: HRRR . Normal S1 and S2 without gallops or murmurs.  Abdomen: Bowel sounds are positive, abdomen soft and non-tender  Msk:  Back normal, normal gait. Normal strength and tone  for age. Extremities: No clubbing, cyanosis or edema.   Neuro: Alert and oriented X 3. Psych:  Good affect, responds appropriately  Labs:   Lab Results  Component Value Date   WBC 6.1 05/02/2023   HGB 13.6 05/02/2023   HCT 45.1 05/02/2023   MCV 88.4 05/02/2023   PLT 161 05/02/2023    Recent Labs  Lab 04/27/23 1507 04/28/23 0640 05/02/23 0718  NA  --    < > 137  K  --    < > 3.7  CL  --    < > 92*  CO2  --    < > 36*  BUN  --    < > 11  CREATININE  --    < > 1.00  CALCIUM  --    < > 8.8*  PROT 6.0*  --   --   BILITOT 0.8  --   --   ALKPHOS 53  --   --   ALT 18  --   --   AST 18  --   --   GLUCOSE  --    < > 137*   < > = values in this interval not displayed.   Lab Results  Component Value Date   CKTOTAL 93 05/23/2014   CKMB 1.7 05/23/2014   TROPONINI  <0.03 12/15/2018    Lab Results  Component Value Date   CHOL 100 03/08/2023   CHOL 117 12/23/2021   CHOL 146 04/27/2021   Lab Results  Component Value Date   HDL 51 03/08/2023   HDL 29 (L) 12/23/2021   HDL 32 (L) 04/27/2021   Lab Results  Component Value Date   LDLCALC 35 03/08/2023   LDLCALC 63 12/23/2021   LDLCALC 35 04/27/2021   Lab Results  Component Value Date   TRIG 65 03/08/2023   TRIG 144 12/23/2021   TRIG 397 (H) 04/27/2021   Lab Results  Component Value Date   CHOLHDL 2.0 03/08/2023   CHOLHDL 4.0 12/23/2021   CHOLHDL 4.6 04/27/2021   Lab Results  Component Value Date   LDLDIRECT 122.1 (H) 09/27/2020   LDLDIRECT 84.1 03/08/2020   LDLDIRECT 58 05/29/2019      Radiology: ECHOCARDIOGRAM COMPLETE  Result Date: 05/01/2023    ECHOCARDIOGRAM REPORT   Patient Name:   DILIN BROSEY [facility] Date of Exam: 05/01/2023 Medical Rec #:  [id number]       Height:       67.0 in Accession #:    [id number]      Weight:       183.2 lb Date of Birth:  [birth year]       BSA:          1.948 m Patient Age:    67 years        BP:           143/93 mmHg Patient Gender: M               HR:           72 bpm. Exam Location:  [facility] Procedure: 2D Echo and Intracardiac Opacification Agent Indications:     CHF I50.21  History:         Patient has prior history of Echocardiogram examinations, most                  recent 11/02/2022.  Sonographer:     Overton Mam RDCS, FASE Referring Phys:  [id number] Diagnosing Phys: Alwyn Pea MD  Sonographer Comments: Technically difficult study due to poor echo windows and suboptimal apical window. Image acquisition challenging due to respiratory motion. IMPRESSIONS  1. Left ventricular ejection fraction, by estimation, is 50 to 55%. The left ventricle has low normal function. The left ventricle has no regional wall motion abnormalities. There is mild concentric left ventricular hypertrophy. Left ventricular diastolic parameters are consistent with Grade  III diastolic dysfunction (restrictive).  2. Right ventricular systolic function is normal. The right ventricular size is normal.  3. Left atrial size was mild to moderately dilated.  4. Right atrial size was mildly dilated.  5. The mitral valve is normal in structure. Trivial mitral valve regurgitation.  6. The aortic valve is normal in structure. Aortic valve regurgitation is trivial. Aortic valve sclerosis is present, with no evidence of aortic valve stenosis. FINDINGS  Left Ventricle: Left ventricular ejection fraction, by estimation, is 50 to 55%. The left ventricle has low normal function. The left ventricle has no regional wall motion abnormalities. Definity contrast agent was given IV to delineate the left ventricular endocardial borders. The left ventricular internal cavity size was normal in size. There is mild concentric left ventricular hypertrophy. Left ventricular diastolic parameters are consistent with Grade III diastolic dysfunction (restrictive). Right Ventricle: The right ventricular size is normal. No increase in right ventricular wall thickness. Right ventricular systolic function is normal. Left Atrium: Left atrial size was mild to moderately dilated. Right Atrium: Right atrial size was mildly dilated. Pericardium: There is no evidence of pericardial effusion. Mitral Valve: The mitral valve is normal in structure. Trivial mitral valve regurgitation. Tricuspid Valve: The tricuspid valve is normal in structure. Tricuspid valve regurgitation is mild. Aortic Valve: The aortic valve is normal in structure. Aortic valve regurgitation is trivial. Aortic valve sclerosis is present, with no evidence of aortic valve stenosis. Aortic valve peak gradient measures 4.9 mmHg. Pulmonic Valve: The pulmonic valve was normal in structure. Pulmonic valve regurgitation is not visualized. Aorta: The ascending aorta was not well visualized. IAS/Shunts: No atrial level shunt detected by color flow Doppler.  LEFT  VENTRICLE PLAX 2D LVIDd:         4.60 cm      Diastology LVIDs:         3.40 cm      LV e' medial:    3.59 cm/s LV PW:         1.30 cm      LV E/e' medial:  27.9 LV IVS:        1.30 cm      LV e' lateral:   13.90 cm/s LVOT diam:     2.00 cm      LV E/e' lateral: 7.2 LV SV:         58 LV SV Index:   30 LVOT Area:     3.14 cm  LV Volumes (MOD) LV vol d, MOD A2C: 139.0 ml LV vol d, MOD A4C: 95.8 ml LV vol s, MOD A2C: 58.0 ml LV vol s, MOD A4C: 42.1 ml LV SV MOD A2C:     81.0 ml LV SV MOD A4C:     95.8 ml LV SV MOD BP:      67.0 ml RIGHT VENTRICLE RV Basal diam:  3.30 cm RV S prime:     9.68 cm/s TAPSE (M-mode): 1.4 cm LEFT ATRIUM             Index        RIGHT ATRIUM  Index LA diam:        5.10 cm 2.62 cm/m   RA Area:     16.10 cm LA Vol (A2C):   55.9 ml 28.69 ml/m  RA Volume:   39.10 ml  20.07 ml/m LA Vol (A4C):   82.0 ml 42.09 ml/m LA Biplane Vol: 74.5 ml 38.24 ml/m  AORTIC VALVE                 PULMONIC VALVE AV Area (Vmax): 2.94 cm     PV Vmax:        0.79 m/s AV Vmax:        111.00 cm/s  PV Peak grad:   2.5 mmHg AV Peak Grad:   4.9 mmHg     RVOT Peak grad: 2 mmHg LVOT Vmax:      104.00 cm/s LVOT Vmean:     67.200 cm/s LVOT VTI:       0.185 m  AORTA Ao Root diam: 3.70 cm Ao Asc diam:  3.30 cm MITRAL VALVE MV Area (PHT): 4.36 cm     SHUNTS MV Decel Time: 174 msec     Systemic VTI:  0.18 m MV E velocity: 100.00 cm/s  Systemic Diam: 2.00 cm MV A velocity: 29.30 cm/s MV E/A ratio:  3.41 Alwyn Pea MD Electronically signed by Alwyn Pea MD Signature Date/Time: 05/01/2023/4:54:24 PM    Final    CT ABDOMEN PELVIS W CONTRAST  Result Date: 04/27/2023 CLINICAL DATA:  Fall at home today. Blunt abdominal trauma. Abdominal pain. On Eliquis. EXAM: CT ABDOMEN AND PELVIS WITH CONTRAST TECHNIQUE: Multidetector CT imaging of the abdomen and pelvis was performed using the standard protocol following bolus administration of intravenous contrast. RADIATION DOSE REDUCTION: This exam was performed  according to the departmental dose-optimization program which includes automated exposure control, adjustment of the mA and/or kV according to patient size and/or use of iterative reconstruction technique. CONTRAST:  OMNIPAQUE IOHEXOL 300 MG/ML  SOLN COMPARISON:  01/27/2023 FINDINGS: Lower chest: New multifocal areas of airspace opacity are seen in both lower lobes. New tiny bilateral pleural effusions also seen bilaterally. Hepatobiliary: No hepatic laceration or mass identified. Prior cholecystectomy. No evidence of biliary obstruction. Pancreas: No parenchymal laceration, mass, or inflammatory changes identified. Spleen: No evidence of splenic laceration. Adrenal/Urinary Tract: No hemorrhage or parenchymal lacerations identified. No evidence of suspicious renal masses or hydronephrosis. Unremarkable unopacified urinary bladder. Stomach/Bowel: Unopacified bowel loops are unremarkable in appearance. No evidence of hemoperitoneum. Vascular/Lymphatic: No evidence of abdominal aortic injury or retroperitoneal hemorrhage. Aortic atherosclerotic calcification incidentally noted. No pathologically enlarged lymph nodes identified. Reproductive:  No mass or other significant abnormality identified. Other:  Stable small left inguinal hernia, which contains only fat. Musculoskeletal: No acute fractures or suspicious bone lesions identified. IMPRESSION: No evidence of visceral injury, hemorrhage, or other acute findings within the abdomen or pelvis. New multifocal airspace opacities in both lower lobes, and tiny bilateral pleural effusions. Differential diagnosis includes pulmonary contusion, aspiration, and pneumonia. Stable small left inguinal hernia, which contains only fat. Electronically Signed   By: Danae Orleans M.D.   On: 04/27/2023 16:53   DG Chest 2 View  Result Date: 04/27/2023 CLINICAL DATA:  Weakness EXAM: CHEST - 2 VIEW COMPARISON:  Chest x-ray dated March 12, 2023 FINDINGS: Cardiac and mediastinal  contours are unchanged status post median sternotomy. Increased diffuse interstitial opacities. Trace bilateral pleural effusions. No evidence of pneumothorax. IMPRESSION: 1. Increased diffuse interstitial opacities, consistent with worsened pulmonary edema. 2. Trace bilateral  pleural effusions. Electronically Signed   By: Allegra Lai M.D.   On: 04/27/2023 16:50   CT HEAD WO CONTRAST ( )  Result Date: 04/27/2023 CLINICAL DATA:  Mental status change, unknown cause.  Fall. EXAM: CT HEAD WITHOUT CONTRAST TECHNIQUE: Contiguous axial images were obtained from the base of the skull through the vertex without intravenous contrast. RADIATION DOSE REDUCTION: This exam was performed according to the departmental dose-optimization program which includes automated exposure control, adjustment of the mA and/or kV according to patient size and/or use of iterative reconstruction technique. COMPARISON:  Head CT 03/27/2023 and MRI 03/28/2023 FINDINGS: Brain: There is no evidence of an acute infarct, intracranial hemorrhage, mass, midline shift, or extra-axial fluid collection. Mild cerebral atrophy is within normal limits for age. Cerebral white matter hypodensities are unchanged and nonspecific but compatible with mild chronic small vessel ischemic disease. Vascular: No hyperdense vessel. Skull: No acute fracture or suspicious osseous lesion. Sinuses/Orbits: Mild mucosal thickening in the paranasal sinuses. No significant mastoid fluid. Unremarkable orbits. Other: None. IMPRESSION: 1. No evidence of acute intracranial abnormality. 2. Mild chronic small vessel ischemic disease. Electronically Signed   By: Sebastian Ache M.D.   On: 04/27/2023 16:35    EKG: Atrial fibrillation rightward axis nonspecific ST-T wave changes anterior septal infarct old no acute findings  ASSESSMENT AND PLAN:  Possible non-STEMI Generalized weakness Acute on chronic congestive heart failure Acute on chronic anemia Upper GI  bleed Paroxysmal atrial fibrillation Multivessel coronary disease Chronic angina Moderate COPD Diabetes History of smoking History of coronary bypass surgery GERD Hyperlipidemia . Plan Agree admit to telemetry follow-up EKGs troponins IV heparin for 48 to 72 hours if okay with GI Continue antianginals nitrates IV transition to p.o. Imdur Continue Ranexa for chronic angina Continue statin therapy for hyperlipidemia Agree with diabetes management and control as per primary Will resume anticoagulation with atrial fibrillation when cleared by GI will continue heparin for now  Inhalers as necessary for COPD Continue Protonix therapy for reflux type symptoms Possible non-STEMI with elevated troponins up to 10,000 we will have to try to treat medically but if symptoms persist will consider repeat cardiac cath Aggressive medical therapy GDMT   Signed: Alwyn Pea MD 05/02/2023, 1:48 PM

## 2023-05-02 NOTE — Progress Notes (Signed)
ANTICOAGULATION CONSULT NOTE  Pharmacy Consult for heparin infusion Indication: ACS/STEMI  Allergies  Allergen Reactions   Prednisone Other (See Comments) and Hypertension    Pt states that this medication puts him in A-fib    Demerol  [Meperidine Hcl]    Demerol [Meperidine] Hives   Jardiance [Empagliflozin] Other (See Comments)    Perineal pain   Sulfa Antibiotics Hives   Albuterol Sulfate [Albuterol] Palpitations and Other (See Comments)    Pt currently uses this medication.     Morphine Sulfate Nausea And Vomiting, Rash and Other (See Comments)    Pt states that he is only allergic to the tablet form of this medication.      Patient Measurements: Height: 5\' 7"  (170.2 cm) Weight: 83.1 kg (183 lb 3.2 oz) IBW/kg (Calculated) : 66.1 Heparin Dosing Weight: 83 kg  Vital Signs: Temp: 98.2 F (36.8 C) (06/16 1939) Temp Source: Oral (06/16 1939) BP: 138/80 (06/16 1939) Pulse Rate: 65 (06/16 1939)  Labs: Recent Labs    05/01/23 0357 05/01/23 2307 05/02/23 0048 05/02/23 0048 05/02/23 0718 05/02/23 1458 05/02/23 2108  HGB 13.9  --   --   --  13.6  --   --   HCT 44.3  --   --   --  45.1  --   --   PLT 173  --   --   --  161  --   --   APTT  --   --  34  --   --   --   --   LABPROT  --   --  15.6*  --   --   --   --   INR  --   --  1.2  --   --   --   --   HEPARINUNFRC  --   --  <0.10*   < > 0.22* 0.32 0.24*  CREATININE 0.88  --   --   --  1.00  --   --   TROPONINIHS  --  10,494* 9,192*  --   --   --   --    < > = values in this interval not displayed.     Estimated Creatinine Clearance: 71.9 mL/min (by C-G formula based on SCr of 1 mg/dL).   Medical History: Past Medical History:  Diagnosis Date   A-fib (HCC)    Anemia    Anginal pain (HCC)    Anxiety    Arthritis    Asthma    CAD (coronary artery disease)    a. 2002 CABGx2 (LIMA->LAD, VG->VG->OM1);  b. 09/2012 DES->OM;  c. 03/2015 PTCA of LAD [facility]) in setting of atretic LIMA; d. 05/2015 Cath [facility]): nonobs  dzs; e. 06/2015 Cath ([facility]): LM nl, LAD 45p/d ISR, 50d, D1/2 small, LCX 50p/d ISR, OM1 70ost, 30 ISR, VG->OM1 50ost, 4m, LIMA->LAD 99p/d - atretic, RCA dom, nl; f.cath 10/16: 40-50%(FFR 0.90) pLAD, 75% (FFR 0.77) mLAD s/p PCI/DES, oRCA 40% (FFR0.95)   Cancer (HCC)    SKIN CANCER ON BACK   Celiac disease    Chronic diastolic CHF (congestive heart failure) (HCC)    a. 06/2009 Echo: EF 60-65%, Gr 1 DD, triv AI, mildly dil LA, nl RV.   COPD (chronic obstructive pulmonary disease) (HCC)    a. Chronic bronchitis and emphysema.   DDD (degenerative disc disease), lumbar    Diverticulosis    Dysrhythmia    Essential hypertension    GERD (gastroesophageal reflux disease)    History of hiatal  hernia    History of kidney stones    H/O   History of tobacco abuse    a. Quit 2014.   Myocardial infarction [facility]) 2002   4 STENTS   Pancreatitis    PSVT (paroxysmal supraventricular tachycardia)    a. 10/2012 Noted on Zio Patch.   Sleep apnea    LOST CORD TO CPAP -ONLY 02 @ BEDTIME   Tubular adenoma of colon    Type II diabetes mellitus (HCC)     Medications:  PTA Meds: Apixaban 5 mg BID, last dose unknown (PTA, admitted 04/27/23)  Assessment: Pt is a 67 yo male admitted 6/11 for symptomatic anemia now c/o onset of chest pain, found with elevated Troponin I level. Baseline labs: aPTT 34,  Hgb 13.9  INR 1.2  HL<0.10  6/16 0718 HL= 0.22   Subtherapeutic 1100>1250 6/16 1458 HL=0.32    Therapeutic x 1 6/16 2108 HL=0.24    Subtherapeutic 1250>1400   Goal of Therapy:  Heparin level 0.3-0.7 units/ml Monitor platelets by anticoagulation protocol: Yes   Plan:  HL = 0.24 Subtherapeutic Heparin 1200 units bolus Increase heparin infusion to 1410 units/hr Will check HL 6 hr after rate change CBC daily while on heparin   Clovia Cuff, PharmD, BCPS 05/02/2023 10:01 PM  9:59 PM

## 2023-05-02 NOTE — Progress Notes (Addendum)
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

## 2023-05-02 NOTE — Progress Notes (Signed)
ANTICOAGULATION CONSULT NOTE  Pharmacy Consult for heparin infusion Indication: ACS/STEMI  Allergies  Allergen Reactions   Prednisone Other (See Comments) and Hypertension    Pt states that this medication puts him in A-fib    Demerol  [Meperidine Hcl]    Demerol [Meperidine] Hives   Jardiance [Empagliflozin] Other (See Comments)    Perineal pain   Sulfa Antibiotics Hives   Albuterol Sulfate [Albuterol] Palpitations and Other (See Comments)    Pt currently uses this medication.     Morphine Sulfate Nausea And Vomiting, Rash and Other (See Comments)    Pt states that he is only allergic to the tablet form of this medication.      Patient Measurements: Height: 5\' 7"  (170.2 cm) Weight: 83.1 kg (183 lb 3.2 oz) IBW/kg (Calculated) : 66.1 Heparin Dosing Weight: 83 kg  Vital Signs: Temp: 97.8 F (36.6 C) (06/16 0740) Temp Source: Oral (06/16 0740) BP: 137/97 (06/16 0730) Pulse Rate: 62 (06/16 0740)  Labs: Recent Labs    05/01/23 0357 05/01/23 2307 05/02/23 0048 05/02/23 0718  HGB 13.9  --   --  13.6  HCT 44.3  --   --  45.1  PLT 173  --   --  161  APTT  --   --  34  --   LABPROT  --   --  15.6*  --   INR  --   --  1.2  --   HEPARINUNFRC  --   --  <0.10* 0.22*  CREATININE 0.88  --   --  1.00  TROPONINIHS  --  10,494* 9,192*  --      Estimated Creatinine Clearance: 71.9 mL/min (by C-G formula based on SCr of 1 mg/dL).   Medical History: Past Medical History:  Diagnosis Date   A-fib (HCC)    Anemia    Anginal pain (HCC)    Anxiety    Arthritis    Asthma    CAD (coronary artery disease)    a. 2002 CABGx2 (LIMA->LAD, VG->VG->OM1);  b. 09/2012 DES->OM;  c. 03/2015 PTCA of LAD [facility]) in setting of atretic LIMA; d. 05/2015 Cath [facility]): nonobs dzs; e. 06/2015 Cath ([facility]): LM nl, LAD 45p/d ISR, 50d, D1/2 small, LCX 50p/d ISR, OM1 70ost, 30 ISR, VG->OM1 50ost, 55m, LIMA->LAD 99p/d - atretic, RCA dom, nl; f.cath 10/16: 40-50%(FFR 0.90) pLAD, 75% (FFR 0.77) mLAD s/p PCI/DES,  oRCA 40% (FFR0.95)   Cancer (HCC)    SKIN CANCER ON BACK   Celiac disease    Chronic diastolic CHF (congestive heart failure) (HCC)    a. 06/2009 Echo: EF 60-65%, Gr 1 DD, triv AI, mildly dil LA, nl RV.   COPD (chronic obstructive pulmonary disease) (HCC)    a. Chronic bronchitis and emphysema.   DDD (degenerative disc disease), lumbar    Diverticulosis    Dysrhythmia    Essential hypertension    GERD (gastroesophageal reflux disease)    History of hiatal hernia    History of kidney stones    H/O   History of tobacco abuse    a. Quit 2014.   Myocardial infarction [facility]) 2002   4 STENTS   Pancreatitis    PSVT (paroxysmal supraventricular tachycardia)    a. 10/2012 Noted on Zio Patch.   Sleep apnea    LOST CORD TO CPAP -ONLY 02 @ BEDTIME   Tubular adenoma of colon    Type II diabetes mellitus (HCC)     Medications:  PTA Meds: Apixaban 5 mg BID, last  dose unknown (PTA, admitted 04/27/23)  Assessment: Pt is a 67 yo male admitted 6/11 for symptomatic anemia now c/o onset of chest pain, found with elevated Troponin I level. Baseline labs: aPTT 34,  Hgb 13.9  INR 1.2  HL<0.10  6/16 0718 HL= 0.22   Subtherapeutic 1100>1250   Goal of Therapy:  Heparin level 0.3-0.7 units/ml Monitor platelets by anticoagulation protocol: Yes   Plan:  6/16 0718  HL= 0.22   Subtherapeutic  Bolus 1200 units x 1 Increase heparin infusion from 1100 units/hr to 1250 units/hr Will check HL 6 hr after rate change CBC daily while on heparin  Bari Mantis PharmD Clinical Pharmacist 05/02/2023

## 2023-05-02 NOTE — Progress Notes (Signed)
ANTICOAGULATION CONSULT NOTE  Pharmacy Consult for heparin infusion Indication: ACS/STEMI  Allergies  Allergen Reactions   Prednisone Other (See Comments) and Hypertension    Pt states that this medication puts him in A-fib    Demerol  [Meperidine Hcl]    Demerol [Meperidine] Hives   Jardiance [Empagliflozin] Other (See Comments)    Perineal pain   Sulfa Antibiotics Hives   Albuterol Sulfate [Albuterol] Palpitations and Other (See Comments)    Pt currently uses this medication.     Morphine Sulfate Nausea And Vomiting, Rash and Other (See Comments)    Pt states that he is only allergic to the tablet form of this medication.      Patient Measurements: Height: 5\' 7"  (170.2 cm) Weight: 83.1 kg (183 lb 3.2 oz) IBW/kg (Calculated) : 66.1 Heparin Dosing Weight: 83 kg  Vital Signs: Temp: 97.5 F (36.4 C) (06/16 1429) Temp Source: Oral (06/16 1429) BP: 138/85 (06/16 0900) Pulse Rate: 68 (06/16 1249)  Labs: Recent Labs    05/01/23 0357 05/01/23 2307 05/02/23 0048 05/02/23 0718 05/02/23 1458  HGB 13.9  --   --  13.6  --   HCT 44.3  --   --  45.1  --   PLT 173  --   --  161  --   APTT  --   --  34  --   --   LABPROT  --   --  15.6*  --   --   INR  --   --  1.2  --   --   HEPARINUNFRC  --   --  <0.10* 0.22* 0.32  CREATININE 0.88  --   --  1.00  --   TROPONINIHS  --  10,494* 9,192*  --   --      Estimated Creatinine Clearance: 71.9 mL/min (by C-G formula based on SCr of 1 mg/dL).   Medical History: Past Medical History:  Diagnosis Date   A-fib (HCC)    Anemia    Anginal pain (HCC)    Anxiety    Arthritis    Asthma    CAD (coronary artery disease)    a. 2002 CABGx2 (LIMA->LAD, VG->VG->OM1);  b. 09/2012 DES->OM;  c. 03/2015 PTCA of LAD [facility]) in setting of atretic LIMA; d. 05/2015 Cath [facility]): nonobs dzs; e. 06/2015 Cath ([facility]): LM nl, LAD 45p/d ISR, 50d, D1/2 small, LCX 50p/d ISR, OM1 70ost, 30 ISR, VG->OM1 50ost, 75m, LIMA->LAD 99p/d - atretic, RCA dom, nl; f.cath  10/16: 40-50%(FFR 0.90) pLAD, 75% (FFR 0.77) mLAD s/p PCI/DES, oRCA 40% (FFR0.95)   Cancer (HCC)    SKIN CANCER ON BACK   Celiac disease    Chronic diastolic CHF (congestive heart failure) (HCC)    a. 06/2009 Echo: EF 60-65%, Gr 1 DD, triv AI, mildly dil LA, nl RV.   COPD (chronic obstructive pulmonary disease) (HCC)    a. Chronic bronchitis and emphysema.   DDD (degenerative disc disease), lumbar    Diverticulosis    Dysrhythmia    Essential hypertension    GERD (gastroesophageal reflux disease)    History of hiatal hernia    History of kidney stones    H/O   History of tobacco abuse    a. Quit 2014.   Myocardial infarction [facility]) 2002   4 STENTS   Pancreatitis    PSVT (paroxysmal supraventricular tachycardia)    a. 10/2012 Noted on Zio Patch.   Sleep apnea    LOST CORD TO CPAP -ONLY 02 @ BEDTIME  Tubular adenoma of colon    Type II diabetes mellitus (HCC)     Medications:  PTA Meds: Apixaban 5 mg BID, last dose unknown (PTA, admitted 04/27/23)  Assessment: Pt is a 67 yo male admitted 6/11 for symptomatic anemia now c/o onset of chest pain, found with elevated Troponin I level. Baseline labs: aPTT 34,  Hgb 13.9  INR 1.2  HL<0.10  6/16 0718 HL= 0.22   Subtherapeutic 1100>1250   Goal of Therapy:  Heparin level 0.3-0.7 units/ml Monitor platelets by anticoagulation protocol: Yes   Plan: HL = 0.32. Therapeutic x 1. Continue heparin infusion at 1250 units/hr Will check HL 6 hr after previous level CBC daily while on heparin   Elliot Gurney, PharmD, BCPS Clinical Pharmacist  05/02/2023 3:31 PM

## 2023-05-03 ENCOUNTER — Encounter: Payer: Self-pay | Admitting: Gastroenterology

## 2023-05-03 ENCOUNTER — Telehealth: Payer: Self-pay

## 2023-05-03 ENCOUNTER — Ambulatory Visit: Payer: Medicare HMO | Admitting: Family Medicine

## 2023-05-03 DIAGNOSIS — D649 Anemia, unspecified: Secondary | ICD-10-CM | POA: Diagnosis not present

## 2023-05-03 LAB — BASIC METABOLIC PANEL
Anion gap: 8 (ref 5–15)
BUN: 12 mg/dL (ref 8–23)
CO2: 37 mmol/L — ABNORMAL HIGH (ref 22–32)
Calcium: 8.7 mg/dL — ABNORMAL LOW (ref 8.9–10.3)
Chloride: 93 mmol/L — ABNORMAL LOW (ref 98–111)
Creatinine, Ser: 1.04 mg/dL (ref 0.61–1.24)
GFR, Estimated: >60 mL/min (ref >60)
Glucose, Bld: 186 mg/dL — ABNORMAL HIGH (ref 70–99)
Potassium: 3.8 mmol/L (ref 3.5–5.1)
Sodium: 138 mmol/L (ref 135–145)

## 2023-05-03 LAB — PHOSPHORUS: Phosphorus: 3.4 mg/dL (ref 2.5–4.6)

## 2023-05-03 LAB — CBC
HCT: 41.8 % (ref 39.0–52.0)
Hemoglobin: 12.9 g/dL — ABNORMAL LOW (ref 13.0–17.0)
MCH: 27 pg (ref 26.0–34.0)
MCHC: 30.9 g/dL (ref 30.0–36.0)
MCV: 87.6 fL (ref 80.0–100.0)
Platelets: 166 10*3/uL (ref 150–400)
RBC: 4.77 MIL/uL (ref 4.22–5.81)
RDW: 16 % — ABNORMAL HIGH (ref 11.5–15.5)
WBC: 6.8 10*3/uL (ref 4.0–10.5)
nRBC: 0 % (ref 0.0–0.2)

## 2023-05-03 LAB — HEPARIN LEVEL (UNFRACTIONATED)
Heparin Unfractionated: 0.42 IU/mL (ref 0.30–0.70)
Heparin Unfractionated: 0.43 IU/mL (ref 0.30–0.70)

## 2023-05-03 LAB — GLUCOSE, CAPILLARY
Glucose-Capillary: 147 mg/dL — ABNORMAL HIGH (ref 70–99)
Glucose-Capillary: 136 mg/dL — ABNORMAL HIGH (ref 70–99)
Glucose-Capillary: 109 mg/dL — ABNORMAL HIGH (ref 70–99)
Glucose-Capillary: 152 mg/dL — ABNORMAL HIGH (ref 70–99)

## 2023-05-03 LAB — MAGNESIUM: Magnesium: 2.1 mg/dL (ref 1.7–2.4)

## 2023-05-03 MED ORDER — ISOSORBIDE MONONITRATE ER 60 MG PO TB24: 120.0000 mg | ORAL_TABLET | Freq: Every day | ORAL | Status: DC

## 2023-05-03 MED ORDER — ISOSORBIDE MONONITRATE ER 60 MG PO TB24: 60.0000 mg | ORAL_TABLET | Freq: Every day | ORAL | Status: DC

## 2023-05-03 MED ORDER — ISOSORBIDE MONONITRATE ER 60 MG PO TB24
120.0000 mg | ORAL_TABLET | Freq: Every day | ORAL | Status: DC
  Administered 2023-05-04: 120 mg via ORAL
  Filled 2023-05-03: qty 2

## 2023-05-03 MED ORDER — APIXABAN 5 MG PO TABS
5.0000 mg | ORAL_TABLET | Freq: Two times a day (BID) | ORAL | Status: DC
  Administered 2023-05-04: 5 mg via ORAL
  Filled 2023-05-03: qty 1

## 2023-05-03 MED ORDER — CLOPIDOGREL BISULFATE 75 MG PO TABS
75.0000 mg | ORAL_TABLET | Freq: Every day | ORAL | Status: DC
  Administered 2023-05-04: 75 mg via ORAL
  Filled 2023-05-03: qty 1

## 2023-05-03 MED ORDER — ISOSORBIDE MONONITRATE ER 60 MG PO TB24
60.0000 mg | ORAL_TABLET | Freq: Once | ORAL | Status: AC
  Administered 2023-05-03: 60 mg via ORAL
  Filled 2023-05-03: qty 1

## 2023-05-03 MED ORDER — ONDANSETRON 4 MG PO TBDP
4.0000 mg | ORAL_TABLET | Freq: Four times a day (QID) | ORAL | Status: DC | PRN
  Administered 2023-05-03: 4 mg via ORAL
  Filled 2023-05-03 (×4): qty 1

## 2023-05-03 MED ORDER — AMIODARONE HCL 200 MG PO TABS
200.0000 mg | ORAL_TABLET | Freq: Every day | ORAL | Status: DC
  Administered 2023-05-04: 200 mg via ORAL
  Filled 2023-05-03: qty 1

## 2023-05-03 MED ORDER — ISOSORBIDE MONONITRATE ER 30 MG PO TB24
30.0000 mg | ORAL_TABLET | Freq: Every day | ORAL | Status: DC
  Administered 2023-05-03: 30 mg via ORAL
  Filled 2023-05-03: qty 1

## 2023-05-03 MED ORDER — LISINOPRIL 20 MG PO TABS
20.0000 mg | ORAL_TABLET | Freq: Every day | ORAL | Status: DC
  Administered 2023-05-03 – 2023-05-04 (×2): 20 mg via ORAL
  Filled 2023-05-03 (×2): qty 1

## 2023-05-03 MED ORDER — HEPARIN (PORCINE) 25000 UT/250ML-% IV SOLN
1400.0000 [IU]/h | INTRAVENOUS | Status: AC
  Administered 2023-05-03: 1400 [IU]/h via INTRAVENOUS
  Filled 2023-05-03: qty 250

## 2023-05-03 NOTE — Progress Notes (Signed)
ANTICOAGULATION CONSULT NOTE  Pharmacy Consult for heparin infusion Indication: ACS/STEMI  Allergies  Allergen Reactions   Prednisone Other (See Comments) and Hypertension    Pt states that this medication puts him in A-fib    Demerol  [Meperidine Hcl]    Demerol [Meperidine] Hives   Jardiance [Empagliflozin] Other (See Comments)    Perineal pain   Sulfa Antibiotics Hives   Albuterol Sulfate [Albuterol] Palpitations and Other (See Comments)    Pt currently uses this medication.     Morphine Sulfate Nausea And Vomiting, Rash and Other (See Comments)    Pt states that he is only allergic to the tablet form of this medication.      Patient Measurements: Height: 5\' 7"  (170.2 cm) Weight: 83.1 kg (183 lb 3.2 oz) IBW/kg (Calculated) : 66.1 Heparin Dosing Weight: 83 kg  Vital Signs: Temp: 98 F (36.7 C) (06/17 0350) Temp Source: Oral (06/17 0350) BP: 135/78 (06/17 0350) Pulse Rate: 70 (06/17 0350)  Labs: Recent Labs    05/01/23 0357 05/01/23 2307 05/02/23 0048 05/02/23 0048 05/02/23 0718 05/02/23 1458 05/02/23 2108 05/03/23 0345  HGB 13.9  --   --   --  13.6  --   --  12.9*  HCT 44.3  --   --   --  45.1  --   --  41.8  PLT 173  --   --   --  161  --   --  166  APTT  --   --  34  --   --   --   --   --   LABPROT  --   --  15.6*  --   --   --   --   --   INR  --   --  1.2  --   --   --   --   --   HEPARINUNFRC  --   --  <0.10*   < > 0.22* 0.32 0.24* 0.42  CREATININE 0.88  --   --   --  1.00  --   --   --   TROPONINIHS  --  10,494* 9,192*  --   --   --   --   --    < > = values in this interval not displayed.     Estimated Creatinine Clearance: 71.9 mL/min (by C-G formula based on SCr of 1 mg/dL).   Medical History: Past Medical History:  Diagnosis Date   A-fib (HCC)    Anemia    Anginal pain (HCC)    Anxiety    Arthritis    Asthma    CAD (coronary artery disease)    a. 2002 CABGx2 (LIMA->LAD, VG->VG->OM1);  b. 09/2012 DES->OM;  c. 03/2015 PTCA of LAD [facility])  in setting of atretic LIMA; d. 05/2015 Cath [facility]): nonobs dzs; e. 06/2015 Cath ([facility]): LM nl, LAD 45p/d ISR, 50d, D1/2 small, LCX 50p/d ISR, OM1 70ost, 30 ISR, VG->OM1 50ost, 55m, LIMA->LAD 99p/d - atretic, RCA dom, nl; f.cath 10/16: 40-50%(FFR 0.90) pLAD, 75% (FFR 0.77) mLAD s/p PCI/DES, oRCA 40% (FFR0.95)   Cancer (HCC)    SKIN CANCER ON BACK   Celiac disease    Chronic diastolic CHF (congestive heart failure) (HCC)    a. 06/2009 Echo: EF 60-65%, Gr 1 DD, triv AI, mildly dil LA, nl RV.   COPD (chronic obstructive pulmonary disease) (HCC)    a. Chronic bronchitis and emphysema.   DDD (degenerative disc disease), lumbar    Diverticulosis  Dysrhythmia    Essential hypertension    GERD (gastroesophageal reflux disease)    History of hiatal hernia    History of kidney stones    H/O   History of tobacco abuse    a. Quit 2014.   Myocardial infarction [facility]) 2002   4 STENTS   Pancreatitis    PSVT (paroxysmal supraventricular tachycardia)    a. 10/2012 Noted on Zio Patch.   Sleep apnea    LOST CORD TO CPAP -ONLY 02 @ BEDTIME   Tubular adenoma of colon    Type II diabetes mellitus (HCC)     Medications:  PTA Meds: Apixaban 5 mg BID, last dose unknown (PTA, admitted 04/27/23)  Assessment: Pt is a 67 yo male admitted 6/11 for symptomatic anemia now c/o onset of chest pain, found with elevated Troponin I level. Baseline labs: aPTT 34,  Hgb 13.9  INR 1.2  HL<0.10  6/16 0718 HL= 0.22   Subtherapeutic 1100>1250 6/16 1458 HL=0.32    Therapeutic x 1 6/16 2108 HL=0.24    Subtherapeutic 1250>1400 6/17 0345 HL=0.42    Therapeutic X 1    Goal of Therapy:  Heparin level 0.3-0.7 units/ml Monitor platelets by anticoagulation protocol: Yes   Plan:  6/17:  HL @ 0345 = 0.42, therapeutic X 1 - will continue pt on current rate and draw confirmation HL in 6 hrs on 6/17 @ 1000.  CBC daily while on heparin   Dymond Gutt D 05/03/2023 4:56 AM  4:56 AM

## 2023-05-03 NOTE — TOC CM/SW Note (Signed)
Patient is not able to walk the distance required to go the bathroom, or he/she is unable to safely negotiate stairs required to access the bathroom.  A 3in1 BSC will alleviate this problem  

## 2023-05-03 NOTE — Progress Notes (Signed)
Mobility Specialist - Progress Note   Pre-mobility: SpO2(96) During mobility: SpO2(95) Post-mobility: HR, BP, SPO2     05/03/23 1143  Mobility  Activity Ambulated with assistance in hallway  Level of Assistance Standby assist, set-up cues, supervision of patient - no hands on  Assistive Device Front wheel walker  Distance Ambulated (ft) 180 ft  Range of Motion/Exercises Active  Activity Response Tolerated well  Mobility Referral Yes  $Mobility charge 1 Mobility  Mobility Specialist Start Time (ACUTE ONLY) 1113  Mobility Specialist Stop Time (ACUTE ONLY) 1130  Mobility Specialist Time Calculation (min) (ACUTE ONLY) 17 min   Pt resting in bed on 2L upon entry. Pt STS and ambulates to hallway around NS with RW SBA (CGA for lines). Pt endorses no pain or fatigue during ambulation but said "his heart hurts when he walks" he told me his RN is aware. Pt gait is moderately steady and O2 remains arounds 94-95% during session. Pt handed off to PT during second lap. Author continues to monitor lines until pt returns to room. Pt left with needs in reach and PT present at bedside.   Johnathan Hausen Mobility Specialist 05/03/23, 11:52 AM

## 2023-05-03 NOTE — Progress Notes (Signed)
Occupational Therapy Treatment Patient Details Name: Lawrence [name] MRN: [id number] DOB: 03-Jul-1956 Today's Date: 05/03/2023   History of present illness Demonte Wright is a 67yoM who comes [facility] on 6/11 after sudden weakness at home, unable to rise from couch as in previous days, ended up on floor, unable to rise. On arrival, BNP >1000, Hb down from 14 to 11, being looked at for possible ABLA. Pt reports being home from STR about 1 week, feels to have made good progress there, returned to household AMB ad lib with RW. Pt was unable to make his first HHPT appointment.   OT comments  Mr. Hardiman was seen for OT tx session this date. Upon arrival to room pt supine in bed, agreeable to OT Tx session. OT facilitated ADL management as described below. See ADL section for additional details regarding occupational performance. Pt continues to be functionally limited by generalized weakness, increased pain, and decreased balance. Pt return verbalizes understanding of education provided t/o session. Pt is progressing toward OT goals and continues to benefit from skilled OT services to maximize return to PLOF and minimize risk of future falls, injury, caregiver burden, and readmission. Will continue to follow POC as written. Discharge recommendation remains appropriate.     Recommendations for follow up therapy are one component of a multi-disciplinary discharge planning process, led by the attending physician.  Recommendations may be updated based on patient status, additional functional criteria and insurance authorization.    Assistance Recommended at Discharge Intermittent Supervision/Assistance  Patient can return home with the following  Assistance with cooking/housework;Assist for transportation;Help with stairs or ramp for entrance   Equipment Recommendations  BSC/3in1    Recommendations for Other Services      Precautions / Restrictions Precautions Precautions: Fall Restrictions Weight  Bearing Restrictions: No       Mobility Bed Mobility Overal bed mobility: Modified Independent Bed Mobility: Supine to Sit, Sit to Supine     Supine to sit: Modified independent (Device/Increase time) Sit to supine: Modified independent (Device/Increase time), HOB elevated        Transfers Overall transfer level: Needs assistance Equipment used: Rolling walker (2 wheels) Transfers: Sit to/from Stand Sit to Stand: Supervision           General transfer comment: Min cueing for hand/foot placement     Balance Overall balance assessment: Needs assistance Sitting-balance support: Feet supported Sitting balance-Leahy Scale: Good     Standing balance support: Reliant on assistive device for balance, During functional activity Standing balance-Leahy Scale: Good                             ADL either performed or assessed with clinical judgement   ADL Overall ADL's : Needs assistance/impaired     Grooming: Wash/dry face;Oral care;Applying deodorant;Sitting;Set up;Supervision/safety Grooming Details (indicate cue type and reason): Educated on energy conservation strategies during functional activity including PLB and activity pacing.                             Functional mobility during ADLs: Supervision/safety;Set up;Cueing for safety;Rolling walker (2 wheels) General ADL Comments: Min cueing for hand placement during STS and funcitonal mobility this date. Pt able to perform side stepping at EOB.    Extremity/Trunk Assessment Upper Extremity Assessment Upper Extremity Assessment: Generalized weakness   Lower Extremity Assessment Lower Extremity Assessment: Generalized weakness   Cervical / Trunk Assessment Cervical /  Trunk Assessment: Normal    Vision Baseline Vision/History: 1 Wears glasses Patient Visual Report: No change from baseline Vision Assessment?: No apparent visual deficits   Perception     Praxis      Cognition  Arousal/Alertness: Awake/alert Behavior During Therapy: WFL for tasks assessed/performed Overall Cognitive Status: Within Functional Limits for tasks assessed                                 General Comments: Pleasant, conversational, A&O x4 t/o session.        Exercises Other Exercises Other Exercises: Pt educated on ECS, falls prevention, and DC recs with assist for ADL management as described above.    Shoulder Instructions       General Comments encouraged energy conservation strategies. patient is eager to return home at time of discharge    Pertinent Vitals/ Pain       Pain Assessment Pain Assessment: Faces Pain Score: 7  Pain Location: chest pain, nausea. Pain Descriptors / Indicators: Discomfort Pain Intervention(s): Limited activity within patient's tolerance, Monitored during session, Repositioned, Patient requesting pain meds-RN notified  Home Living                                          Prior Functioning/Environment              Frequency  Min 2X/week        Progress Toward Goals  OT Goals(current goals can now be found in the care plan section)  Progress towards OT goals: Progressing toward goals  Acute Rehab OT Goals Patient Stated Goal: to feel better OT Goal Formulation: With patient Time For Goal Achievement: 05/14/23 Potential to Achieve Goals: Good  Plan Discharge plan remains appropriate;Frequency remains appropriate    Co-evaluation                 AM-PAC OT "6 Clicks" Daily Activity     Outcome Measure   Help from another person eating meals?: None Help from another person taking care of personal grooming?: A Little Help from another person toileting, which includes using toliet, bedpan, or urinal?: A Little Help from another person bathing (including washing, rinsing, drying)?: A Little Help from another person to put on and taking off regular upper body clothing?: None Help from another  person to put on and taking off regular lower body clothing?: A Little 6 Click Score: 20    End of Session Equipment Utilized During Treatment: Rolling walker (2 wheels);Gait belt  OT Visit Diagnosis: Unsteadiness on feet (R26.81);Repeated falls (R29.6)   Activity Tolerance Patient tolerated treatment well   Patient Left in chair;with chair alarm set;with call bell/phone within reach   Nurse Communication Patient requests pain meds        Time: 1610-9604 OT Time Calculation (min): 21 min  Charges: OT General Charges $OT Visit: 1 Visit OT Treatments $Self Care/Home Management : 8-22 mins  Rockney Ghee, M.S., OTR/L 05/03/23, 3:28 PM

## 2023-05-03 NOTE — Progress Notes (Signed)
ANTICOAGULATION CONSULT NOTE  Pharmacy Consult for heparin infusion Indication: ACS/STEMI  Allergies  Allergen Reactions   Prednisone Other (See Comments) and Hypertension    Pt states that this medication puts him in A-fib    Demerol  [Meperidine Hcl]    Demerol [Meperidine] Hives   Jardiance [Empagliflozin] Other (See Comments)    Perineal pain   Sulfa Antibiotics Hives   Albuterol Sulfate [Albuterol] Palpitations and Other (See Comments)    Pt currently uses this medication.     Morphine Sulfate Nausea And Vomiting, Rash and Other (See Comments)    Pt states that he is only allergic to the tablet form of this medication.      Patient Measurements: Height: 5\' 7"  (170.2 cm) Weight: 83.1 kg (183 lb 3.2 oz) IBW/kg (Calculated) : 66.1 Heparin Dosing Weight: 83 kg  Vital Signs: Temp: 97.7 F (36.5 C) (06/17 0731) Temp Source: Oral (06/17 0731) BP: 138/87 (06/17 0731) Pulse Rate: 71 (06/17 0731)  Labs: Recent Labs    05/01/23 0357 05/01/23 2307 05/02/23 0048 05/02/23 0048 05/02/23 0718 05/02/23 1458 05/02/23 2108 05/03/23 0345 05/03/23 1036  HGB 13.9  --   --   --  13.6  --   --  12.9*  --   HCT 44.3  --   --   --  45.1  --   --  41.8  --   PLT 173  --   --   --  161  --   --  166  --   APTT  --   --  34  --   --   --   --   --   --   LABPROT  --   --  15.6*  --   --   --   --   --   --   INR  --   --  1.2  --   --   --   --   --   --   HEPARINUNFRC  --   --  <0.10*   < > 0.22*   < > 0.24* 0.42 0.43  CREATININE 0.88  --   --   --  1.00  --   --  1.04  --   TROPONINIHS  --  10,494* 9,192*  --   --   --   --   --   --    < > = values in this interval not displayed.     Estimated Creatinine Clearance: 69.1 mL/min (by C-G formula based on SCr of 1.04 mg/dL).   Medical History: Past Medical History:  Diagnosis Date   A-fib (HCC)    Anemia    Anginal pain (HCC)    Anxiety    Arthritis    Asthma    CAD (coronary artery disease)    a. 2002 CABGx2 (LIMA->LAD,  VG->VG->OM1);  b. 09/2012 DES->OM;  c. 03/2015 PTCA of LAD [facility]) in setting of atretic LIMA; d. 05/2015 Cath [facility]): nonobs dzs; e. 06/2015 Cath ([facility]): LM nl, LAD 45p/d ISR, 50d, D1/2 small, LCX 50p/d ISR, OM1 70ost, 30 ISR, VG->OM1 50ost, 38m, LIMA->LAD 99p/d - atretic, RCA dom, nl; f.cath 10/16: 40-50%(FFR 0.90) pLAD, 75% (FFR 0.77) mLAD s/p PCI/DES, oRCA 40% (FFR0.95)   Cancer (HCC)    SKIN CANCER ON BACK   Celiac disease    Chronic diastolic CHF (congestive heart failure) (HCC)    a. 06/2009 Echo: EF 60-65%, Gr 1 DD, triv AI, mildly dil LA, nl RV.  COPD (chronic obstructive pulmonary disease) (HCC)    a. Chronic bronchitis and emphysema.   DDD (degenerative disc disease), lumbar    Diverticulosis    Dysrhythmia    Essential hypertension    GERD (gastroesophageal reflux disease)    History of hiatal hernia    History of kidney stones    H/O   History of tobacco abuse    a. Quit 2014.   Myocardial infarction [facility]) 2002   4 STENTS   Pancreatitis    PSVT (paroxysmal supraventricular tachycardia)    a. 10/2012 Noted on Zio Patch.   Sleep apnea    LOST CORD TO CPAP -ONLY 02 @ BEDTIME   Tubular adenoma of colon    Type II diabetes mellitus (HCC)     Medications:  PTA Meds: Apixaban 5 mg BID, last dose unknown (PTA, admitted 04/27/23)  Assessment: Pt is a 67 yo male admitted 6/11 for symptomatic anemia now c/o onset of chest pain, found with elevated Troponin I level. Baseline labs: aPTT 34,  Hgb 13.9  INR 1.2  HL<0.10  6/16 0718 HL= 0.22   Subtherapeutic 1100>1250 6/16 1458 HL=0.32    Therapeutic x 1 6/16 2108 HL=0.24    Subtherapeutic 1250>1400 6/17 0345 HL=0.42    Therapeutic X 1  6/17 1036 HL=0.43   Goal of Therapy:  Heparin level 0.3-0.7 units/ml Monitor platelets by anticoagulation protocol: Yes   Plan:  Heparin level is therapeutic. Plan to stop heparin at 0100 on 6/18. Continue heparin at 1400 units/hr. NO levels needed.    Ronnald Ramp, PharmD,  BCPS 05/03/2023 11:13 AM  11:13 AM

## 2023-05-03 NOTE — Telephone Encounter (Signed)
-----   Message from Toney Reil, MD sent at 05/02/2023 12:09 PM EDT ----- Regarding: push enteroscopy Morrie Sheldon  Please schedule push enteroscopy negative EGD and colon, IDA He is on eliquis, needs clearance to stop  Thanks RV

## 2023-05-03 NOTE — Care Management Important Message (Signed)
Important Message  Patient Details  Name: Lawrence [name] MRN: [id number] Date of Birth: Apr 13, 1956   Medicare Important Message Given:  Yes     [name] 05/03/2023, 1:41 PM

## 2023-05-03 NOTE — Progress Notes (Signed)
[facility] Progress Note  Patient: Lawrence [name]    YQI:[id number]  DOA: 04/27/2023     Date of Service: the patient was seen and examined on 05/03/2023  Chief Complaint  Patient presents with   Fall   Weakness   Brief hospital course:  JAMIESON CONCHA is a 67 y.o. male with past medical history of CAD status post STEMI, chronic angina, heart failure with preserved EF, A-fib, COPD, here with epigastric discomfort and generalized weakness.  The patient states that today, he woke up, and essentially is unable to support himself due to weakness.    BNP was elevated at 1093.5, high sensitive troponin was 17, lactic acid was 1.7 on repeat was 1.3.   Assessment and Plan:  # Non-STEMI developed chest pain at night on 6/15 H/o CAD,  continue statins and ranexa No significant EKG changes Troponin peaked 38756 -- 9192 trending down Continue to monitor on telemetry Continue heparin IV infusion through 6/18 per cardiology TTE: LVEF 50 to 55%, grade 3 diastolic dysfunction.  No regional wall motion abnormality. Ongoing exertional chest pain Aspirin converted to plavix, imdur re-started, lisinopril restarted Per cardiology not planning on cath at this time  # Acute on chronic diastolic heart failure  Presented with nonspecific symptoms of generalized weakness not able to get around elevated BNP 1093 S/p IV Lasix 40 mg bid-->8L UOP--changed to po torsemide Now appears euvolemic  - continue home torsemide  # Acute on chronic anemia Baseline hemoglobin 14.8--15.2 Presented with hemoglobin of 11.2 patient unable to give any history of dark stools on eliquis for a fib--- now holding S/p PPI IV infusion for 72 hours. Started PPI 40 pod,  H&H remained stable GI consulted s/p EGD normal, biopsy taken, s/p colonoscopy, poor prep, normal mucosa, no biopsy taken. S/p capsule endoscopy: Study is complete, capsule reached cecum Normal small bowel transit time. Possible 2 non-bleeding AVMs in  proximal to mid small bowel, appeared like tiny red spots, non specific finding GI recommended: Possible AVMs in proximal to mid small bowel. Ok to resume eliquis Push enteroscopy to be scheduled as outpt Hgb has been stable  # iron deficiency, transferrin saturation 7% Continue oral iron supplement  # Paroxysmal a fib -- resumed amiodarone -- hold eliquis for now, resume when heparin infusion will be discontinued    # COPD, stable   PT OT advising home health which has been ordered  Body mass index is 29.42 kg/m.  Interventions:     Diet: Carb modified diet DVT Prophylaxis: Therapeutic Anticoagulation with heparin IV infusion    Advance goals of care discussion: DNR  Family Communication: none at bedside Disposition:  Plan for home with home health and 3-in-1, all orders have been placed   Subjective: this morning chest pain with ambulation now resolved, tolerating diet   Physical Exam: General: NAD, lying comfortably Appear in no distress, affect appropriate Eyes: PERRLA ENT: Oral Mucosa Clear, moist  Neck: no JVD,  Cardiovascular: Irregular rhythm, distant heart sounds Respiratory: good respiratory effort, Bilateral Air entry equal and Decreased, no Crackles, no wheezes Abdomen: Bowel Sound present, Soft and no tenderness,  Skin: no rashes Extremities: no Pedal edema, no calf tenderness Neurologic: without any new focal findings    Vitals:   05/03/23 0350 05/03/23 0731 05/03/23 0737 05/03/23 1147  BP: 135/78 138/87  (!) 150/99  Pulse: 70 71  80  Resp: 18 14  16   Temp: 98 F (36.7 C) 97.7 F (36.5 C)  97.9 F (36.6  C)  TempSrc: Oral Oral  Oral  SpO2: 97% 92% 94% 99%  Weight:      Height:        Intake/Output Summary (Last 24 hours) at 05/03/2023 1302 Last data filed at 05/03/2023 1052 Gross per 24 hour  Intake 1366.47 ml  Output 2750 ml  Net -1383.53 ml   Filed Weights   04/27/23 1159 04/30/23 0342 05/01/23 0405  Weight: 81.6 kg 85.2 kg 83.1 kg     Data Reviewed: I have personally reviewed and interpreted daily labs, tele strips, imagings as discussed above. I reviewed all nursing notes, pharmacy notes, vitals, pertinent old records I have discussed plan of care as described above with RN and patient/family.  CBC: Recent Labs  Lab 04/27/23 1201 04/28/23 0640 05/01/23 0357 05/02/23 0718 05/03/23 0345  WBC 6.9 6.3 5.3 6.1 6.8  HGB 11.7* 11.7* 13.9 13.6 12.9*  HCT 38.3* 37.7* 44.3 45.1 41.8  MCV 88.5 87.5 87.4 88.4 87.6  PLT 118* 135* 173 161 166   Basic Metabolic Panel: Recent Labs  Lab 04/27/23 1201 04/28/23 0640 05/01/23 0357 05/02/23 0718 05/03/23 0345  NA 140 141 144 137 138  K 3.6 3.4* 3.7 3.7 3.8  CL 110 102 100 92* 93*  CO2 21* 30 35* 36* 37*  GLUCOSE 112* 88 114* 137* 186*  BUN 16 16 8 11 12   CREATININE 0.89 0.88 0.88 1.00 1.04  CALCIUM 7.6* 7.9* 8.9 8.8* 8.7*  MG  --   --  1.8 1.6* 2.1  PHOS  --   --  3.8 3.8 3.4    Studies: No results found.  Scheduled Meds:  [START ON 05/04/2023] amiodarone  200 mg Oral Daily   [START ON 05/04/2023] apixaban  5 mg Oral BID   arformoterol  15 mcg Nebulization BID   And   umeclidinium bromide  1 puff Inhalation Daily   atorvastatin  80 mg Oral Daily   budesonide  2 mL Inhalation BID   [START ON 05/04/2023] clopidogrel  75 mg Oral Daily   insulin aspart  0-15 Units Subcutaneous TID WC   insulin aspart  0-5 Units Subcutaneous QHS   iron polysaccharides  150 mg Oral Daily   [START ON 05/04/2023] isosorbide mononitrate  120 mg Oral Daily   isosorbide mononitrate  60 mg Oral Once   lisinopril  20 mg Oral Daily   pantoprazole  40 mg Oral Daily   ranolazine  1,000 mg Oral BID   torsemide  50 mg Oral Daily   traZODone  150 mg Oral QHS   venlafaxine XR  75 mg Oral Q breakfast   Continuous Infusions:  heparin 1,400 Units/hr (05/03/23 1209)   PRN Meds: ALPRAZolam, ipratropium-albuterol, meclizine, melatonin, morphine injection, nitroGLYCERIN, ondansetron,  oxyCODONE-acetaminophen **AND** oxyCODONE, senna-docusate  Time spent: 35 minutes  Author: Shonna Chock, MD [facility] 05/03/2023 1:02 PM  To reach On-call, see care teams to locate the attending and reach out to them via [email or web address]. If 7PM-7AM, please contact night-coverage If you still have difficulty reaching the attending provider, please page the [facility] (Director on Call) for [facility] on amion for assistance.

## 2023-05-03 NOTE — Progress Notes (Signed)
Physical Therapy Treatment Patient Details Name: Lawrence [name] MRN: [id number] DOB: January 17, 1956 Today's Date: 05/03/2023   History of Present Illness Lawrence [name] is a 67yoM who comes [facility] on [facility] after sudden weakness at home, unable to rise from couch as in previous days, ended up on floor, unable to rise. On arrival, BNP >1000, Hb down from 14 to 11, being looked at for possible ABLA. Pt reports being home from STR about 1 week, feels to have made good progress there, returned to household AMB ad lib with RW. Pt was unable to make his first HHPT appointment.    PT Comments    Patient is agreeable to PT. Reinforcement of rolling walker use as well as energy conservation techniques. Sp02 remained in the low 90's with mobility on 2 L02. Recommend to continue PT to maximize independence in preparation for home discharge. Patient is eager to return home soon.    Recommendations for follow up therapy are one component of a multi-disciplinary discharge planning process, led by the attending physician.  Recommendations may be updated based on patient status, additional functional criteria and insurance authorization.  Follow Up Recommendations       Assistance Recommended at Discharge Set up Supervision/Assistance  Patient can return home with the following Assist for transportation;Assistance with cooking/housework;Help with stairs or ramp for entrance   Equipment Recommendations  None recommended by PT    Recommendations for Other Services       Precautions / Restrictions Precautions Precautions: Fall Restrictions Weight Bearing Restrictions: No     Mobility  Bed Mobility Overal bed mobility: Modified Independent         Sit to supine: Modified independent (Device/Increase time), HOB elevated        Transfers Overall transfer level: Needs assistance Equipment used: Rolling walker (2 wheels) Transfers: Sit to/from Stand Sit to Stand: Supervision                 Ambulation/Gait Ambulation/Gait assistance: Min guard Gait Distance (Feet): 120 Feet Assistive device: Rolling walker (2 wheels) Gait Pattern/deviations: Step-through pattern, Trunk flexed Gait velocity: decreased     General Gait Details: patient in hallway with mobility team at start of session. reinforced use of rolling walker, energy conservation with taking breaks as needed. Sp02 92% on 2 L02 after walking with no significant shortness of breath noted with activity   Stairs             Wheelchair Mobility    Modified Rankin (Stroke Patients Only)       Balance Overall balance assessment: Needs assistance Sitting-balance support: Feet supported Sitting balance-Leahy Scale: Good     Standing balance support: Reliant on assistive device for balance Standing balance-Leahy Scale: Fair                              Cognition Arousal/Alertness: Awake/alert Behavior During Therapy: WFL for tasks assessed/performed Overall Cognitive Status: Within Functional Limits for tasks assessed                                          Exercises      General Comments General comments (skin integrity, edema, etc.): encouraged energy conservation strategies. patient is eager to return home at time of discharge      Pertinent Vitals/Pain Pain Assessment Pain Assessment: Faces Faces Pain Scale:  Hurts a little bit Pain Location: generalized Pain Descriptors / Indicators: Discomfort Pain Intervention(s):  (patient requesting pain meds via call bell towards end of session)    Home Living                          Prior Function            PT Goals (current goals can now be found in the care plan section) Acute Rehab PT Goals Patient Stated Goal: return home PT Goal Formulation: With patient Time For Goal Achievement: 05/13/23 Potential to Achieve Goals: Good Progress towards PT goals: Progressing toward goals     Frequency    Min 3X/week      PT Plan Current plan remains appropriate    Co-evaluation              AM-PAC PT "6 Clicks" Mobility   Outcome Measure  Help needed turning from your back to your side while in a flat bed without using bedrails?: None Help needed moving from lying on your back to sitting on the side of a flat bed without using bedrails?: None Help needed moving to and from a bed to a chair (including a wheelchair)?: A Little Help needed standing up from a chair using your arms (e.g., wheelchair or bedside chair)?: A Little Help needed to walk in hospital room?: A Little Help needed climbing 3-5 steps with a railing? : A Lot 6 Click Score: 19    End of Session Equipment Utilized During Treatment: Oxygen;Gait belt Activity Tolerance: Patient tolerated treatment well Patient left: in bed;with call bell/phone within reach;with bed alarm set Nurse Communication: Mobility status PT Visit Diagnosis: Difficulty in walking, not elsewhere classified (R26.2);Muscle weakness (generalized) (M62.81)     Time: 1308-6578 PT Time Calculation (min) (ACUTE ONLY): 34 min  Charges:  $Therapeutic Activity: 23-37 mins                     Donna Bernard, PT, MPT   Ina Homes 05/03/2023, 1:06 PM

## 2023-05-03 NOTE — Progress Notes (Signed)
[facility] CLINIC CARDIOLOGY PROGRESS NOTE   Patient ID: Lawrence [name] MRN: [id number] DOB/AGE: Apr 03, 1956 67 y.o.  Admit date: 04/27/2023 Referring Physician Dr. Gillis Santa Primary Physician Dr. [name] Primary Cardiologist Dr. Dorothyann Peng Reason for Consultation NSTEMI  HPI: Lawrence [name] is a 67 y.o. male with a past medical history of CAD (h/o STEMI s/p CABG x2 LIMA-LAD and SVG-OM1 2002 c/b coronary artery dissection with multiple subsequent PCI and stents), chronic stable angina, HFpEF (50-55%, mild LVH 10/2022), permanent atrial fibrillation s/p unsuccessful DCCV 06/29/2022, COPD (PRN 2L), hx tobacco use, DM2 who presented to the ED on 04/27/2023 for fall and weakness. He was initially worked up and treated for aymptomatic anemia. On the evening of 6/15 he began complaining of 9/10 chest pain, troponin at that time was elevated to 10,494. Cardiology was consulted at that time for further assistance.   Interval History:  -Patient reports he is doing ok this AM, resting comfortably in bed upon my examination.  -Continues to report CP, states this is about the same as yesterday. He reports it does not improve with nitroglycerin, only Morphine.   Review of systems complete and found to be negative unless listed above    Vitals:   05/03/23 0350 05/03/23 0731 05/03/23 0737 05/03/23 1147  BP: 135/78 138/87  (!) 150/99  Pulse: 70 71  80  Resp: 18 14  16   Temp: 98 F (36.7 C) 97.7 F (36.5 C)  97.9 F (36.6 C)  TempSrc: Oral Oral  Oral  SpO2: 97% 92% 94% 99%  Weight:      Height:         Intake/Output Summary (Last 24 hours) at 05/03/2023 1259 Last data filed at 05/03/2023 1052 Gross per 24 hour  Intake 1366.47 ml  Output 2750 ml  Net -1383.53 ml     PHYSICAL EXAM General: Chronically ill-appearing male, well nourished, in no acute distress resting comfortably in hospital bed. HEENT: Normocephalic and atraumatic. Neck: No JVD.  Lungs: Normal respiratory effort  on 2L (baseline). Clear bilaterally to auscultation. No wheezes, crackles, rhonchi.  Heart: Irregularly irregular. Normal S1 and S2 without gallops or murmurs. Radial & DP pulses 2+ bilaterally. Abdomen: Non-distended appearing.  Msk: Normal strength and tone for age. Extremities: No clubbing, cyanosis or edema.   Neuro: Alert and oriented X 3. Psych: Mood appropriate, affect congruent.    LABS: Basic Metabolic Panel: Recent Labs    05/02/23 0718 05/03/23 0345  NA 137 138  K 3.7 3.8  CL 92* 93*  CO2 36* 37*  GLUCOSE 137* 186*  BUN 11 12  CREATININE 1.00 1.04  CALCIUM 8.8* 8.7*  MG 1.6* 2.1  PHOS 3.8 3.4   Liver Function Tests: No results for input(s): "AST", "ALT", "ALKPHOS", "BILITOT", "PROT", "ALBUMIN" in the last 72 hours. No results for input(s): "LIPASE", "AMYLASE" in the last 72 hours. CBC: Recent Labs    05/02/23 0718 05/03/23 0345  WBC 6.1 6.8  HGB 13.6 12.9*  HCT 45.1 41.8  MCV 88.4 87.6  PLT 161 166   Cardiac Enzymes: Recent Labs    05/01/23 2307 05/02/23 0048  TROPONINIHS 10,494* 9,192*   BNP: No results for input(s): "BNP" in the last 72 hours. D-Dimer: No results for input(s): "DDIMER" in the last 72 hours. Hemoglobin A1C: No results for input(s): "HGBA1C" in the last 72 hours. Fasting Lipid Panel: No results for input(s): "CHOL", "HDL", "LDLCALC", "TRIG", "CHOLHDL", "LDLDIRECT" in the last 72 hours. Thyroid Function Tests: No results for input(s): "  TSH", "T4TOTAL", "T3FREE", "THYROIDAB" in the last 72 hours.  Invalid input(s): "FREET3" Anemia Panel: No results for input(s): "VITAMINB12", "FOLATE", "FERRITIN", "TIBC", "IRON", "RETICCTPCT" in the last 72 hours.  No results found.   ECHO 05/01/2023:  1. Left ventricular ejection fraction, by estimation, is 50 to 55%. The left ventricle has low normal function. The left ventricle has no regional wall motion abnormalities. There is mild concentric left ventricular hypertrophy. Left ventricular  diastolic parameters are consistent with Grade III diastolic dysfunction (restrictive).   2. Right ventricular systolic function is normal. The right ventricular size is normal.   3. Left atrial size was mild to moderately dilated.   4. Right atrial size was mildly dilated.   5. The mitral valve is normal in structure. Trivial mitral valve regurgitation.   6. The aortic valve is normal in structure. Aortic valve regurgitation is trivial. Aortic valve sclerosis is present, with no evidence of aortic valve stenosis.   TELEMETRY reviewed by me 05/03/23: rate controlled atrial fibrillation HR 60-70s  EKG reviewed by me 05/03/23: atrial fibrillation rate 70s  DATA reviewed by me 05/03/23: ED provider note, admission H&P, GI note, hospitalist progress note, last 24h vitals tele labs imaging I/O   ASSESSMENT AND PLAN: Lawrence [name] is a 67 y.o. male with a past medical history of CAD (h/o STEMI s/p CABG x2 LIMA-LAD and SVG-OM1 2002 c/b coronary artery dissection with multiple subsequent PCI and stents), chronic stable angina, HFpEF (50-55%, mild LVH 10/2022), permanent atrial fibrillation s/p unsuccessful DCCV 06/29/2022, COPD (PRN 2L), hx tobacco use, DM2 who presented to the ED on 04/27/2023 for fall and weakness. He was initially worked up and treated for aymptomatic anemia. On the evening of 6/15 he began complaining of 9/10 chest pain, troponin at that time was elevated to 10,494. Cardiology was consulted at that time for further assistance.   # NSTEMI # CAD s/p CABG x2 in 2002  # Hypertension Patient with onset of CP 6/15, troponins 10,494 > 9,192. CP remains about the same today per patient. Has not received SL nitroglycerin since 6/15. BP this afternoon 150/99. -Heparin infusion x48 hours to end at 0100 on 6/18.  -Will switch from aspirin to Plavix for antiplatelet therapy to begin tomorrow.  -Home imdur restarted today, will uptitrate dose for improved antianginal therapy. Continue ranexa  1000 mg bid. -Restart Lisinopril today. Restart metoprolol as BP and HR allow.  -Optimize medical therapy for anginal symptoms. No plan for cardiac diagnostics at this time.   # Symptomatic Anemia # Acute on Chronic Iron Deficiency Anemia # Small bowel AVMs S/p EGD and colonoscopy on 6/15, no active GI bleeding identified.  -Appreciate GI recommendations. Ok to restart Eliquis.  -Per GI, if bleeding occurs on Eliquis and Plavix will need push enteroscopy.   # Chronic HFpEF (50-55%, mild LVH, grade I diastolic dysfunction 04/2023)  -Appears euvolemic on exam. Renal function stable -Lisinopril and metoprolol held currently. Will restart Lisinopril today. -Continue torsemide 50 mg daily.  #Permanent Atrial Fibrillation Rate controlled on tele, lows in the 50-60s overnight.  -Decrease amiodarone to 200 mg once daily for maintenance therapy.  -Metoprolol held on admission given BP and HR. Plan to restart as BP and HR allow.  -Restart eliquis 5mg  BID for stroke prevention tomorrow. Was held for concern for GI bleed.    Principal Problem:   Symptomatic anemia Active Problems:   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HCC)   Former tobacco use   Chronic obstructive pulmonary disease (COPD) (  HCC)   BPH (benign prostatic hyperplasia)   Anxiety   Acute on chronic congestive heart failure (HCC)   Recurrent major depressive disorder, remission status unspecified (HCC)   Primary insomnia   Dyslipidemia   Hx of CABG   Coronary artery disease   GERD without esophagitis   Paroxysmal atrial fibrillation (HCC)   Gastrointestinal hemorrhage   Acute on chronic blood loss anemia    This patient's case was discussed and created with Dr. Juliann Pares and he is in agreement.  Thanvi Blincoe Burke, PA-C 05/03/2023, 12:59 PM [facility] Cardiology

## 2023-05-03 NOTE — TOC Progression Note (Signed)
Transition of Care [facility]) - Progression Note    Patient Details  Name: Lawrence [name] MRN: [id number] Date of Birth: [birth year]  Transition of Care [facility]) CM/SW Contact  Margarito Liner, LCSW Phone Number: 05/03/2023, 11:31 AM  Clinical Narrative:   Patient was active with [facility] for RN and social worker prior to admission. They can add PT and OT. Per [facility] hospital liaison, patient is agreeable to 3-in-1. CSW sent secure chat to MD requesting DME order and that he cosign narrative.  Expected Discharge Plan: Home w Home Health Services Barriers to Discharge: Continued Medical Work up  Expected Discharge Plan and Services       Living arrangements for the past 2 months: Single Family Home                           HH Arranged: PT [facility] Agency: [facility] Date [facility] Agency Contacted: 04/30/23   Representative spoke with at [facility] Agency: Adelina Mings   Social Determinants of Health (SDOH) Interventions SDOH Screenings   Food Insecurity: No Food Insecurity (04/28/2023)  Housing: Low Risk  (04/28/2023)  Transportation Needs: No Transportation Needs (04/28/2023)  Utilities: Not At Risk (04/28/2023)  Alcohol Screen: Low Risk  (03/10/2023)  Depression (PHQ2-9): Low Risk  (03/10/2023)  Financial Resource Strain: Low Risk  (03/10/2023)  Physical Activity: Inactive (03/10/2023)  Social Connections: Moderately Isolated (03/10/2023)  Stress: No Stress Concern Present (03/10/2023)  Tobacco Use: Medium Risk (05/03/2023)    Readmission Risk Interventions     No data to display

## 2023-05-03 NOTE — Consult Note (Signed)
[facility] Customer service manager [employer]) Accountable Care Organization (ACO) [facility] Liaison Note  05/03/2023  Lawrence [name] 28-Sep-1956 [id number]  Location: [facility] RN Hospital Liaison met patient at bedside at [facility].  Insurance: [employer]   Lawrence [name] is a 67 y.o. male who is a Primary Care Patient of [name], Demetrios Isaacs, MD [facility]). The patient was screened with 30 days readmission hospitalization with noted extreme high risk score for unplanned readmission risk with 4 IP/17 ED  in 6 months.  The patient was assessed for potential [facility]) Care Management service needs for post hospital transition for care coordination. Review of patient's electronic medical record reveals patient was admitted with Symptomatic anemia. [facility] liaison visited pt at bedside and introduced [facility] services and available benefits.. Pt receptive to a referral for [facility] RN care coordinator post discharged. For prevention readmission and ongoing management of care due to his high risk.  Also collaborated with TOC as pt requesting DME ([facility]) upon his discharge. Provided appointment care for post hospital follow up with his provider.   Plan: [facility] Liaison will continue to follow progress and disposition to asess for post hospital community care coordination/management needs.  Referral request for community care coordination: pending disposition.   [facility] Care Management/Population Health does not replace or interfere with any arrangements made by the Inpatient Transition of Care team.   For questions contact:   Elliot Cousin, RN, BSN [facility] Liaison [place]   [facility]  Population Health Office Hours MTWF  8:00 am-6:00 pm Off on Thursday [phone] mobile [phone] [Office toll free line][facility] Office Hours are M-F 8:30 - 5 pm 24 hour nurse advise line [phone] Concierge   [email or web address]

## 2023-05-03 NOTE — Telephone Encounter (Signed)
Patient is still admitted in the hospital. Tried to call patient but voicemail Is full unable to leave a message

## 2023-05-03 NOTE — Plan of Care (Signed)
  Problem: Education: Goal: Ability to describe self-care measures that may prevent or decrease complications (Diabetes Survival Skills Education) will improve Outcome: Progressing   

## 2023-05-04 ENCOUNTER — Other Ambulatory Visit: Payer: Self-pay | Admitting: *Deleted

## 2023-05-04 DIAGNOSIS — D649 Anemia, unspecified: Secondary | ICD-10-CM | POA: Diagnosis not present

## 2023-05-04 LAB — CBC
HCT: 41.5 % (ref 39.0–52.0)
Hemoglobin: 12.5 g/dL — ABNORMAL LOW (ref 13.0–17.0)
MCH: 26.8 pg (ref 26.0–34.0)
MCHC: 30.1 g/dL (ref 30.0–36.0)
MCV: 88.9 fL (ref 80.0–100.0)
Platelets: 172 10*3/uL (ref 150–400)
RBC: 4.67 MIL/uL (ref 4.22–5.81)
RDW: 16.5 % — ABNORMAL HIGH (ref 11.5–15.5)
WBC: 6.6 10*3/uL (ref 4.0–10.5)
nRBC: 0 % (ref 0.0–0.2)

## 2023-05-04 LAB — BASIC METABOLIC PANEL
Anion gap: 7 (ref 5–15)
BUN: 12 mg/dL (ref 8–23)
CO2: 37 mmol/L — ABNORMAL HIGH (ref 22–32)
Calcium: 8.8 mg/dL — ABNORMAL LOW (ref 8.9–10.3)
Chloride: 90 mmol/L — ABNORMAL LOW (ref 98–111)
Creatinine, Ser: 1.01 mg/dL (ref 0.61–1.24)
GFR, Estimated: >60 mL/min (ref >60)
Glucose, Bld: 125 mg/dL — ABNORMAL HIGH (ref 70–99)
Potassium: 3.7 mmol/L (ref 3.5–5.1)
Sodium: 134 mmol/L — ABNORMAL LOW (ref 135–145)

## 2023-05-04 LAB — GLUCOSE, CAPILLARY: Glucose-Capillary: 141 mg/dL — ABNORMAL HIGH (ref 70–99)

## 2023-05-04 LAB — SURGICAL PATHOLOGY

## 2023-05-04 MED ORDER — CLOPIDOGREL BISULFATE 75 MG PO TABS: 75.0000 mg | ORAL_TABLET | Freq: Every day | ORAL | 0 refills | Status: DC

## 2023-05-04 MED ORDER — AMIODARONE HCL 200 MG PO TABS: 200.0000 mg | ORAL_TABLET | Freq: Every day | ORAL | 0 refills | Status: DC

## 2023-05-04 MED ORDER — METOPROLOL SUCCINATE ER 50 MG PO TB24: 100.0000 mg | ORAL_TABLET | Freq: Every day | ORAL | 0 refills | Status: DC

## 2023-05-04 MED ORDER — POLYSACCHARIDE IRON COMPLEX 150 MG PO CAPS: 150.0000 mg | ORAL_CAPSULE | Freq: Every day | ORAL | 1 refills | Status: DC

## 2023-05-04 MED ORDER — ISOSORBIDE MONONITRATE ER 120 MG PO TB24: 120.0000 mg | ORAL_TABLET | Freq: Every day | ORAL | 0 refills | Status: DC

## 2023-05-04 MED ORDER — METOPROLOL SUCCINATE ER 50 MG PO TB24: 50.0000 mg | ORAL_TABLET | Freq: Every day | ORAL | 11 refills | Status: DC

## 2023-05-04 MED ORDER — PANTOPRAZOLE SODIUM 40 MG PO TBEC: 40.0000 mg | DELAYED_RELEASE_TABLET | Freq: Every day | ORAL | 1 refills | Status: DC

## 2023-05-04 NOTE — Progress Notes (Signed)
[facility] CLINIC CARDIOLOGY PROGRESS NOTE   Patient ID: Lawrence [name] MRN: [id number] DOB/AGE: Mar 14, 1956 67 y.o.  Admit date: 04/27/2023 Referring Physician Dr. Gillis Santa Primary Physician Dr. [name] Primary Cardiologist Dr. Dorothyann Peng Reason for Consultation NSTEMI  HPI: Lawrence [name] is a 67 y.o. male with a past medical history of CAD (h/o STEMI s/p CABG x2 LIMA-LAD and SVG-OM1 2002 c/b coronary artery dissection with multiple subsequent PCI and stents), chronic stable angina, HFpEF (50-55%, mild LVH 10/2022), permanent atrial fibrillation s/p unsuccessful DCCV 06/29/2022, COPD (PRN 2L), hx tobacco use, DM2 who presented to the ED on 04/27/2023 for fall and weakness. He was initially worked up and treated for aymptomatic anemia. On the evening of 6/15 he began complaining of 9/10 chest pain, troponin at that time was elevated to 10,494. Cardiology was consulted at that time for further assistance.   Interval History:  -Patient reports he is feeling much better this AM. States he is ready to go home.  -Denies CP this AM, tolerating Ranexa and Imdur well.  -Remains in rate controlled atrial fibrillation on tele.   Review of systems complete and found to be negative unless listed above    Vitals:   05/03/23 2313 05/04/23 0352 05/04/23 0740 05/04/23 0805  BP: 103/65 105/66 118/73   Pulse: 65 61 (!) 58   Resp: 18 18 16    Temp: 98 F (36.7 C) 97.6 F (36.4 C) (!) 97.5 F (36.4 C)   TempSrc:      SpO2: 94% 97% 98% 98%  Weight:      Height:         Intake/Output Summary (Last 24 hours) at 05/04/2023 0945 Last data filed at 05/04/2023 7829 Gross per 24 hour  Intake 572.1 ml  Output 2325 ml  Net -1752.9 ml     PHYSICAL EXAM General: Chronically ill-appearing male, well nourished, in no acute distress sitting upright in bedside chair. HEENT: Normocephalic and atraumatic. Neck: No JVD.  Lungs: Normal respiratory effort on 2L (baseline). Clear bilaterally to  auscultation. No wheezes, crackles, rhonchi.  Heart: Irregularly irregular, controlled rate. Normal S1 and S2 without gallops or murmurs. Radial & DP pulses 2+ bilaterally. Abdomen: Non-distended appearing.  Msk: Normal strength and tone for age. Extremities: No clubbing, cyanosis or edema.   Neuro: Alert and oriented X 3. Psych: Mood appropriate, affect congruent.    LABS: Basic Metabolic Panel: Recent Labs    05/02/23 0718 05/03/23 0345 05/04/23 0428  NA 137 138 134*  K 3.7 3.8 3.7  CL 92* 93* 90*  CO2 36* 37* 37*  GLUCOSE 137* 186* 125*  BUN 11 12 12   CREATININE 1.00 1.04 1.01  CALCIUM 8.8* 8.7* 8.8*  MG 1.6* 2.1  --   PHOS 3.8 3.4  --    Liver Function Tests: No results for input(s): "AST", "ALT", "ALKPHOS", "BILITOT", "PROT", "ALBUMIN" in the last 72 hours. No results for input(s): "LIPASE", "AMYLASE" in the last 72 hours. CBC: Recent Labs    05/03/23 0345 05/04/23 0428  WBC 6.8 6.6  HGB 12.9* 12.5*  HCT 41.8 41.5  MCV 87.6 88.9  PLT 166 172   Cardiac Enzymes: Recent Labs    05/01/23 2307 05/02/23 0048  TROPONINIHS 10,494* 9,192*   BNP: No results for input(s): "BNP" in the last 72 hours. D-Dimer: No results for input(s): "DDIMER" in the last 72 hours. Hemoglobin A1C: No results for input(s): "HGBA1C" in the last 72 hours. Fasting Lipid Panel: No results for input(s): "CHOL", "HDL", "  LDLCALC", "TRIG", "CHOLHDL", "LDLDIRECT" in the last 72 hours. Thyroid Function Tests: No results for input(s): "TSH", "T4TOTAL", "T3FREE", "THYROIDAB" in the last 72 hours.  Invalid input(s): "FREET3" Anemia Panel: No results for input(s): "VITAMINB12", "FOLATE", "FERRITIN", "TIBC", "IRON", "RETICCTPCT" in the last 72 hours.  No results found.   ECHO 05/01/2023:  1. Left ventricular ejection fraction, by estimation, is 50 to 55%. The left ventricle has low normal function. The left ventricle has no regional wall motion abnormalities. There is mild concentric left  ventricular hypertrophy. Left ventricular diastolic parameters are consistent with Grade III diastolic dysfunction (restrictive).   2. Right ventricular systolic function is normal. The right ventricular size is normal.   3. Left atrial size was mild to moderately dilated.   4. Right atrial size was mildly dilated.   5. The mitral valve is normal in structure. Trivial mitral valve regurgitation.   6. The aortic valve is normal in structure. Aortic valve regurgitation is trivial. Aortic valve sclerosis is present, with no evidence of aortic valve stenosis.   TELEMETRY reviewed by me 05/04/23: atrial fibrillation rate 60s, rates to 40-50s overnight.   EKG reviewed by me 05/04/23: atrial fibrillation rate 70s  DATA reviewed by me 05/04/23: hospitalist progress note, PT/OT notes, last 24h vitals tele labs imaging I/O   ASSESSMENT AND PLAN: Lawrence [name] is a 67 y.o. male with a past medical history of CAD (h/o STEMI s/p CABG x2 LIMA-LAD and SVG-OM1 2002 c/b coronary artery dissection with multiple subsequent PCI and stents), chronic stable angina, HFpEF (50-55%, mild LVH 10/2022), permanent atrial fibrillation s/p unsuccessful DCCV 06/29/2022, COPD (PRN 2L), hx tobacco use, DM2 who presented to the ED on 04/27/2023 for fall and weakness. He was initially worked up and treated for aymptomatic anemia. On the evening of 6/15 he began complaining of 9/10 chest pain, troponin at that time was elevated to 10,494. Cardiology was consulted at that time for further assistance.   # NSTEMI # CAD s/p CABG x2 in 2002  # Hypertension Patient with onset of CP 6/15, troponins 10,494 > 9,192. CP remains about the same today per patient. Has not received SL nitroglycerin since 6/15. BP this afternoon 150/99. -S/p heparin infusion x 48 hours (ended 0100 6/18).  -Switch from aspirin to Plavix for antiplatelet therapy starting today.  -Uptitrate Imdur to 120 mg today. Continue ranexa 1000 mg bid. -Continue lisinopril  20 mg daily. Restart metoprolol succinate at 50 mg daily on discharge.  -Patient to follow up in 1 week with Dr. Juliann Pares.   # Symptomatic Anemia # Acute on Chronic Iron Deficiency Anemia # Small bowel AVMs S/p EGD and colonoscopy on 6/15, no active GI bleeding identified.  -Appreciate GI recommendations. Ok to restart Eliquis today.  -Per GI, if bleeding occurs on Eliquis and Plavix will need push enteroscopy.   # Chronic HFpEF (50-55%, mild LVH, grade I diastolic dysfunction 04/2023)  -Appears euvolemic on exam. Renal function stable -Continue lisinopril 20 mg daily. -Continue torsemide 50 mg daily.  # Permanent Atrial Fibrillation # Bradycardia Rate controlled on tele, lows in the 40-50s overnight.  -Continue amiodarone 200 mg daily.  -Restart metoprolol succinate 50 mg daily on discharge. -Restart eliquis 5mg  BID for stroke prevention today. Was held for concern for GI bleed.    Principal Problem:   Symptomatic anemia Active Problems:   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HCC)   Former tobacco use   Chronic obstructive pulmonary disease (COPD) (HCC)   BPH (benign prostatic hyperplasia)  Anxiety   Acute on chronic congestive heart failure (HCC)   Recurrent major depressive disorder, remission status unspecified (HCC)   Primary insomnia   Dyslipidemia   Hx of CABG   Coronary artery disease   GERD without esophagitis   Paroxysmal atrial fibrillation (HCC)   Gastrointestinal hemorrhage   Acute on chronic blood loss anemia    Stable for discharge today from cardiac perspective.   This patient's case was discussed and created with Dr. Juliann Pares and he is in agreement.  Adrean Heitz [place], PA-C 05/04/2023, 9:45 AM [facility]

## 2023-05-04 NOTE — Plan of Care (Signed)
  Problem: Education: Goal: Ability to describe self-care measures that may prevent or decrease complications (Diabetes Survival Skills Education) will improve Outcome: Progressing Goal: Individualized Educational Video(s) Outcome: Progressing   Problem: Coping: Goal: Ability to adjust to condition or change in health will improve Outcome: Progressing   Problem: Fluid Volume: Goal: Ability to maintain a balanced intake and output will improve Outcome: Progressing   Problem: Health Behavior/Discharge Planning: Goal: Ability to identify and utilize available resources and services will improve Outcome: Progressing Goal: Ability to manage health-related needs will improve Outcome: Progressing   Problem: Metabolic: Goal: Ability to maintain appropriate glucose levels will improve Outcome: Progressing   Problem: Nutritional: Goal: Maintenance of adequate nutrition will improve Outcome: Progressing Goal: Progress toward achieving an optimal weight will improve Outcome: Progressing   Problem: Skin Integrity: Goal: Risk for impaired skin integrity will decrease Outcome: Progressing   Problem: Tissue Perfusion: Goal: Adequacy of tissue perfusion will improve Outcome: Progressing   Problem: Education: Goal: Knowledge of General Education information will improve Description: Including pain rating scale, medication(s)/side effects and non-pharmacologic comfort measures Outcome: Progressing   Problem: Health Behavior/Discharge Planning: Goal: Ability to manage health-related needs will improve Outcome: Progressing   Problem: Clinical Measurements: Goal: Ability to maintain clinical measurements within normal limits will improve Outcome: Progressing Goal: Will remain free from infection Outcome: Progressing Goal: Diagnostic test results will improve Outcome: Progressing Goal: Respiratory complications will improve Outcome: Progressing Goal: Cardiovascular complication will  be avoided Outcome: Progressing   Problem: Activity: Goal: Risk for activity intolerance will decrease Outcome: Progressing   Problem: Nutrition: Goal: Adequate nutrition will be maintained Outcome: Progressing   Problem: Coping: Goal: Level of anxiety will decrease Outcome: Progressing   Problem: Elimination: Goal: Will not experience complications related to bowel motility Outcome: Progressing Goal: Will not experience complications related to urinary retention Outcome: Progressing   Problem: Pain Managment: Goal: General experience of comfort will improve Outcome: Progressing   Problem: Safety: Goal: Ability to remain free from injury will improve Outcome: Progressing   Problem: Skin Integrity: Goal: Risk for impaired skin integrity will decrease Outcome: Progressing   Problem: Education: Goal: Ability to identify signs and symptoms of gastrointestinal bleeding will improve Outcome: Progressing   Problem: Bowel/Gastric: Goal: Will show no signs and symptoms of gastrointestinal bleeding Outcome: Progressing   Problem: Fluid Volume: Goal: Will show no signs and symptoms of excessive bleeding Outcome: Progressing   Problem: Clinical Measurements: Goal: Complications related to the disease process, condition or treatment will be avoided or minimized Outcome: Progressing   

## 2023-05-04 NOTE — Discharge Instructions (Signed)
Keep log of sugars at home 

## 2023-05-04 NOTE — TOC Transition Note (Signed)
Transition of Care [facility]) - CM/SW Discharge Note   Patient Details  Name: Lawrence [name] MRN: [id number] Date of Birth: 1956-01-23  Transition of Care [facility]) CM/SW Contact:  Margarito Liner, LCSW Phone Number: 05/04/2023, 9:46 AM   Clinical Narrative:  Patient has orders to discharge home today. [facility] liaison is aware. Ordered 3-in-1 through [employer]. No further concerns. CSW signing off.   Final next level of care: Home w Home Health Services Barriers to Discharge: Barriers Resolved   Patient Goals and CMS Choice CMS Medicare.gov Compare Post Acute Care list provided to:: Patient Choice offered to / list presented to : Patient  Discharge Placement                      Patient and family notified of of transfer: 05/04/23  Discharge Plan and Services Additional resources added to the After Visit Summary for                  DME Arranged: 3-N-1 DME Agency: [employer] Date DME Agency Contacted: 05/04/23   Representative spoke with at DME Agency: Yvone Neu HH Arranged: RN, PT, OT, Social Work [facility] Agency: [facility] Date [facility] Agency Contacted: 05/04/23   Representative spoke with at [facility] Agency: Alfonzo Beers  Social Determinants of Health (SDOH) Interventions SDOH Screenings   Food Insecurity: No Food Insecurity (04/28/2023)  Housing: Low Risk  (04/28/2023)  Transportation Needs: No Transportation Needs (04/28/2023)  Utilities: Not At Risk (04/28/2023)  Alcohol Screen: Low Risk  (03/10/2023)  Depression (PHQ2-9): Low Risk  (03/10/2023)  Financial Resource Strain: Low Risk  (03/10/2023)  Physical Activity: Inactive (03/10/2023)  Social Connections: Moderately Isolated (03/10/2023)  Stress: No Stress Concern Present (03/10/2023)  Tobacco Use: Medium Risk (05/03/2023)     Readmission Risk Interventions     No data to display

## 2023-05-04 NOTE — Discharge Summary (Signed)
Physician Discharge Summary   Patient: Lawrence [name] MRN: [id number] DOB: 05-30-1956  Admit date:     04/27/2023  Discharge date: 05/04/23  Discharge Physician: Enedina Finner   PCP: Malva Limes, MD   Recommendations at discharge:    Keep log of your sugars at home Use your oxygen as before F/u Dr Juliann Pares in 1-2 weeks F/u PCP in 1-2 weeks  Discharge Diagnoses: Principal Problem:   Symptomatic anemia Active Problems:   BPH (benign prostatic hyperplasia)   Paroxysmal atrial fibrillation (HCC)   GERD without esophagitis   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HCC)   Former tobacco use   Chronic obstructive pulmonary disease (COPD) (HCC)   Anxiety   Acute on chronic congestive heart failure (HCC)   Recurrent major depressive disorder, remission status unspecified (HCC)   Primary insomnia   Dyslipidemia   Hx of CABG   Coronary artery disease   Gastrointestinal hemorrhage   Acute on chronic blood loss anemia  Lawrence [name] is a 67 y.o. male with past medical history of CAD status post STEMI, chronic angina, heart failure with preserved EF, A-fib, COPD, here with epigastric discomfort and generalized weakness.  The patient states that today, he woke up, and essentially is unable to support himself due to weakness.    BNP was elevated at 1093.5, high sensitive troponin was 17, lactic acid was 1.7 on repeat was 1.3.   Non-STEMI developed chest pain at night on 6/15 H/o CAD,  continue statins and ranexa No significant EKG changes Troponin peaked 86578 -- 9192 trending down. Pt was on heparin gtt--now back on eliqius (home med) NO cp today. Ambulating to BR in the room [facility] from [facility] cardiology standpoint for d/c. Cardiac meds adjusted TTE: LVEF 50 to 55%, grade 3 diastolic dysfunction.  No regional wall motion abnormality. Aspirin converted to plavix, imdur re-started, lisinopril restarted Per cardiology not planning on cath at this time   Acute on chronic diastolic  heart failure  Presented with nonspecific symptoms of generalized weakness not able to get around elevated BNP 1093 S/p IV Lasix 40 mg bid-->8L UOP--changed to po torsemide Now appears euvolemic  - continue home torsemide --pt on chronic oxygen at home--will resume using it once he reaches back home.    Acute on chronic anemia Baseline hemoglobin 14.8--15.2 Presented with hemoglobin of 11.2 patient unable to give any history of dark stools on eliquis for a fib--- now holding S/p PPI IV infusion for 72 hours. Started PPI 40 pod,  H&H remained stable GI consulted s/p EGD normal, biopsy taken, s/p colonoscopy, poor prep, normal mucosa, no biopsy taken. S/p capsule endoscopy: Study is complete, capsule reached cecum Normal small bowel transit time. 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 Ok to resume eliquis Push enteroscopy to be scheduled as outpt Hgb has been stable F/u GI as out pt    iron deficiency, transferrin saturation 7% Continue oral iron supplement   Paroxysmal a fib resumed amiodarone -resumed eliquis at dishcharge     COPD, stable   PT OT advising home health which has been ordered   Body mass index is 29.42 kg/m.        Diet: Carb modified diet DVT Prophylaxis: Eliquis   Advance goals of care discussion: DNR   Family Communication: Spoke with Tobi Bastos (son's fiance) Disposition:  Plan for home with home health and  3-in-1, all orders have been placed    Pain control - [facility] [place] Controlled Substance Reporting System database was reviewed. and patient was instructed, not to drive, operate heavy machinery, perform activities at heights, swimming or participation in water activities or provide baby-sitting services while on Pain, Sleep and Anxiety Medications; until their outpatient Physician has advised to do so again. Also  recommended to not to take more than prescribed Pain, Sleep and Anxiety Medications.  Consultants: Cardiology, GI Procedures performed: EGD,Cap endoscopy  Disposition: Home health Diet recommendation:  Discharge Diet Orders (From admission, onward)     Start     Ordered   05/04/23 0000  Diet - low sodium heart healthy        05/04/23 0922           Cardiac and Carb modified diet DISCHARGE MEDICATION: Allergies as of 05/04/2023       Reactions   Prednisone Other (See Comments), Hypertension   Pt states that this medication puts him in A-fib    Demerol  [meperidine Hcl]    Demerol [meperidine] Hives   Jardiance [empagliflozin] Other (See Comments)   Perineal pain   Sulfa Antibiotics Hives   Albuterol Sulfate [albuterol] Palpitations, Other (See Comments)   Pt currently uses this medication.     Morphine Sulfate Nausea And Vomiting, Rash, Other (See Comments)   Pt states that he is only allergic to the tablet form of this medication.          Medication List     STOP taking these medications    aspirin 81 MG chewable tablet   zolpidem 5 MG tablet Commonly known as: Ambien       TAKE these medications    Accu-Chek Guide Control Liqd Use with blood glucose monitor as directed   Accu-Chek Guide test strip Generic drug: glucose blood Use as instructed to check sugar daily for type 2 diabetes.   Accu-Chek Guide w/Device Kit Use to check blood sugars as directed   Accu-Chek Softclix Lancets lancets Use as instructed to check sugar daily for type 2 diabetes.   acetaminophen 325 MG tablet Commonly known as: TYLENOL Take 2 tablets (650 mg total) by mouth every 4 (four) hours as needed for headache or mild pain.   albuterol 108 (90 Base) MCG/ACT inhaler Commonly known as: VENTOLIN HFA INHALE 2 PUFFS BY MOUTH EVERY 6 HOURS AS NEEDED FOR SHORTNESS OF BREATH   allopurinol 300 MG tablet Commonly known as: ZYLOPRIM TAKE 1 TABLET BY MOUTH TWICE A DAY    ALPRAZolam 1 MG tablet Commonly known as: XANAX Take 1 mg by mouth 3 (three) times daily as needed.   amiodarone 200 MG tablet Commonly known as: PACERONE Take 1 tablet (200 mg total) by mouth daily. What changed: when to take this   atorvastatin 80 MG tablet Commonly known as: LIPITOR Take 1 tablet (80 mg total) by mouth daily. Please schedule office visit before any future refill.   Breztri Aerosphere 160-9-4.8 MCG/ACT Aero Generic drug: Budeson-Glycopyrrol-Formoterol Inhale 2 puffs into the lungs in the morning and at bedtime.   celecoxib 200 MG capsule Commonly known as: CELEBREX Take 1 capsule (200 mg total) by mouth 2 (two) times daily as needed. Home med.   cetirizine 10 MG tablet Commonly known as: ZYRTEC TAKE 1 TABLET BY MOUTH AT BEDTIME What changed: Another medication with the same name was removed. Continue taking this medication, and follow the directions you see here.   clopidogrel 75 MG tablet Commonly  known as: PLAVIX Take 1 tablet (75 mg total) by mouth daily.   Eliquis 5 MG Tabs tablet Generic drug: apixaban TAKE 1 TABLET BY MOUTH TWICE A DAY   glipiZIDE 5 MG tablet Commonly known as: GLUCOTROL Take 1 tablet (5 mg total) by mouth daily as needed (sugar over 300).   iron polysaccharides 150 MG capsule Commonly known as: NIFEREX Take 1 capsule (150 mg total) by mouth daily.   isosorbide mononitrate 120 MG 24 hr tablet Commonly known as: IMDUR Take 1 tablet (120 mg total) by mouth daily. What changed:  medication strength how much to take additional instructions   lisinopril 20 MG tablet Commonly known as: ZESTRIL Take 20 mg by mouth daily.   meclizine 25 MG tablet Commonly known as: ANTIVERT TAKE ONE TABLET BY THREE TIMES A DAY AS NEEDED FOR DIZZINESS   metFORMIN 500 MG 24 hr tablet Commonly known as: GLUCOPHAGE-XR TAKE 1 TABLET BY MOUTH TWICE A DAY   methocarbamol 500 MG tablet Commonly known as: ROBAXIN Take 500 mg by mouth every 8  (eight) hours as needed for muscle spasms. Take 1 tablet by mouth every hour as needed for muscle spasms   metoprolol succinate 50 MG 24 hr tablet Commonly known as: Toprol XL Take 1 tablet (50 mg total) by mouth daily. Take with or immediately following a meal. What changed:  medication strength how much to take additional instructions   naloxone 4 MG/0.1ML Liqd nasal spray kit Commonly known as: NARCAN 1 spray into nostril x1 and may repeat every 2-3 minutes until patient is responsive or EMS arrives   nitroGLYCERIN 0.4 MG SL tablet Commonly known as: NITROSTAT Place 1 tablet (0.4 mg total) under the tongue every 5 (five) minutes as needed for chest pain.   omega-3 acid ethyl esters 1 g capsule Commonly known as: LOVAZA Take 4 capsules (4 g total) by mouth daily.   omeprazole 40 MG capsule Commonly known as: PRILOSEC TAKE 1 CAPSULE BY MOUTH 2 TIMES DAILY 30 MINUTES BEFORE BREAKFAST AND DINNER   oxyCODONE-acetaminophen 10-325 MG tablet Commonly known as: PERCOCET TAKE 1 TABLET BY MOUTH EVERY 4 HOURS AS NEEDED FOR PAIN   pantoprazole 40 MG tablet Commonly known as: PROTONIX Take 1 tablet (40 mg total) by mouth daily.   ranolazine 1000 MG SR tablet Commonly known as: RANEXA Take 1 tablet (1,000 mg total) by mouth 2 (two) times daily.   torsemide 100 MG tablet Commonly known as: DEMADEX Take 0.5 tablets (50 mg total) by mouth daily.   traZODone 150 MG tablet Commonly known as: DESYREL TAKE 1 TABLET BY MOUTH AT BEDTIME   venlafaxine XR 75 MG 24 hr capsule Commonly known as: EFFEXOR-XR TAKE 1 CAPSULE BY MOUTH DAILY WITH BREAKFAST               Durable Medical Equipment  (From admission, onward)           Start     Ordered   05/03/23 1146  For home use only DME 3 n 1  Once        05/03/23 1145            Follow-up Information     Callwood, Dwayne D, MD. Go in 1 week(s).   Specialties: Cardiology, Internal Medicine Contact information: [address] [place] [address] [phone]         Malva Limes, MD. Schedule an appointment as soon as possible for a visit.   Specialty: Family Medicine  Why: hospital f/u Contact information: [address] [place] [address] [place] [address] [phone]                Discharge Exam: Ceasar Mons Weights   04/27/23 1159 04/30/23 0342 05/01/23 0405  Weight: 81.6 kg 85.2 kg 83.1 kg   GENERAL:  67 y.o.-year-old patient with no acute distress.  LUNGS decrease breath sounds bilaterally. No resp distress CARDIOVASCULAR: S1, S2 normal. No murmur   ABDOMEN: Soft, nontender, nondistended. EXTREMITIES: No  edema b/l.    NEUROLOGIC: nonfocal  patient is alert and awake  Condition at discharge: fair  The results of significant diagnostics from this hospitalization (including imaging, microbiology, ancillary and laboratory) are listed below for reference.   Imaging Studies: ECHOCARDIOGRAM COMPLETE  Result Date: 05/01/2023    ECHOCARDIOGRAM REPORT   Patient Name:   AYO DEJARDIN [facility] Date of Exam: 05/01/2023 Medical Rec #:  [id number]       Height:       67.0 in Accession #:    [id number]      Weight:       183.2 lb Date of Birth:  08-23-1956       BSA:          1.948 m Patient Age:    67 years        BP:           143/93 mmHg Patient Gender: M               HR:           72 bpm. Exam Location:  [facility] Procedure: 2D Echo and Intracardiac Opacification Agent Indications:     CHF I50.21  History:         Patient has prior history of Echocardiogram examinations, most                  recent 11/02/2022.  Sonographer:     Overton Mam RDCS, FASE Referring Phys:  [id number] Gillis Santa Diagnosing Phys: Alwyn Pea MD  Sonographer Comments: Technically difficult study due to poor echo windows and suboptimal apical window. Image acquisition challenging due to respiratory motion. IMPRESSIONS  1. Left ventricular ejection fraction, by estimation, is 50 to 55%. The left ventricle has low  normal function. The left ventricle has no regional wall motion abnormalities. There is mild concentric left ventricular hypertrophy. Left ventricular diastolic parameters are consistent with Grade III diastolic dysfunction (restrictive).  2. Right ventricular systolic function is normal. The right ventricular size is normal.  3. Left atrial size was mild to moderately dilated.  4. Right atrial size was mildly dilated.  5. The mitral valve is normal in structure. Trivial mitral valve regurgitation.  6. The aortic valve is normal in structure. Aortic valve regurgitation is trivial. Aortic valve sclerosis is present, with no evidence of aortic valve stenosis. FINDINGS  Left Ventricle: Left ventricular ejection fraction, by estimation, is 50 to 55%. The left ventricle has low normal function. The left ventricle has no regional wall motion abnormalities. Definity contrast agent was given IV to delineate the left ventricular endocardial borders. The left ventricular internal cavity size was normal in size. There is mild concentric left ventricular hypertrophy. Left ventricular diastolic parameters are consistent with Grade III diastolic dysfunction (restrictive). Right Ventricle: The right ventricular size is normal. No increase in right ventricular wall thickness. Right ventricular systolic function is normal. Left Atrium: Left atrial size was mild to moderately dilated. Right Atrium: Right atrial size was mildly dilated.  Pericardium: There is no evidence of pericardial effusion. Mitral Valve: The mitral valve is normal in structure. Trivial mitral valve regurgitation. Tricuspid Valve: The tricuspid valve is normal in structure. Tricuspid valve regurgitation is mild. Aortic Valve: The aortic valve is normal in structure. Aortic valve regurgitation is trivial. Aortic valve sclerosis is present, with no evidence of aortic valve stenosis. Aortic valve peak gradient measures 4.9 mmHg. Pulmonic Valve: The pulmonic valve was  normal in structure. Pulmonic valve regurgitation is not visualized. Aorta: The ascending aorta was not well visualized. IAS/Shunts: No atrial level shunt detected by color flow Doppler.  LEFT VENTRICLE PLAX 2D LVIDd:         4.60 cm      Diastology LVIDs:         3.40 cm      LV e' medial:    3.59 cm/s LV PW:         1.30 cm      LV E/e' medial:  27.9 LV IVS:        1.30 cm      LV e' lateral:   13.90 cm/s LVOT diam:     2.00 cm      LV E/e' lateral: 7.2 LV SV:         58 LV SV Index:   30 LVOT Area:     3.14 cm  LV Volumes (MOD) LV vol d, MOD A2C: 139.0 ml LV vol d, MOD A4C: 95.8 ml LV vol s, MOD A2C: 58.0 ml LV vol s, MOD A4C: 42.1 ml LV SV MOD A2C:     81.0 ml LV SV MOD A4C:     95.8 ml LV SV MOD BP:      67.0 ml RIGHT VENTRICLE RV Basal diam:  3.30 cm RV S prime:     9.68 cm/s TAPSE (M-mode): 1.4 cm LEFT ATRIUM             Index        RIGHT ATRIUM           Index LA diam:        5.10 cm 2.62 cm/m   RA Area:     16.10 cm LA Vol (A2C):   55.9 ml 28.69 ml/m  RA Volume:   39.10 ml  20.07 ml/m LA Vol (A4C):   82.0 ml 42.09 ml/m LA Biplane Vol: 74.5 ml 38.24 ml/m  AORTIC VALVE                 PULMONIC VALVE AV Area (Vmax): 2.94 cm     PV Vmax:        0.79 m/s AV Vmax:        111.00 cm/s  PV Peak grad:   2.5 mmHg AV Peak Grad:   4.9 mmHg     RVOT Peak grad: 2 mmHg LVOT Vmax:      104.00 cm/s LVOT Vmean:     67.200 cm/s LVOT VTI:       0.185 m  AORTA Ao Root diam: 3.70 cm Ao Asc diam:  3.30 cm MITRAL VALVE MV Area (PHT): 4.36 cm     SHUNTS MV Decel Time: 174 msec     Systemic VTI:  0.18 m MV E velocity: 100.00 cm/s  Systemic Diam: 2.00 cm MV A velocity: 29.30 cm/s MV E/A ratio:  3.41 Dwayne Salome Arnt MD Electronically signed by Alwyn Pea MD Signature Date/Time: 05/01/2023/4:54:24 PM    Final    CT ABDOMEN PELVIS W  CONTRAST  Result Date: 04/27/2023 CLINICAL DATA:  Fall at home today. Blunt abdominal trauma. Abdominal pain. On Eliquis. EXAM: CT ABDOMEN AND PELVIS WITH CONTRAST TECHNIQUE:  Multidetector CT imaging of the abdomen and pelvis was performed using the standard protocol following bolus administration of intravenous contrast. RADIATION DOSE REDUCTION: This exam was performed according to the departmental dose-optimization program which includes automated exposure control, adjustment of the mA and/or kV according to patient size and/or use of iterative reconstruction technique. CONTRAST:  OMNIPAQUE IOHEXOL 300 MG/ML  SOLN COMPARISON:  01/27/2023 FINDINGS: Lower chest: New multifocal areas of airspace opacity are seen in both lower lobes. New tiny bilateral pleural effusions also seen bilaterally. Hepatobiliary: No hepatic laceration or mass identified. Prior cholecystectomy. No evidence of biliary obstruction. Pancreas: No parenchymal laceration, mass, or inflammatory changes identified. Spleen: No evidence of splenic laceration. Adrenal/Urinary Tract: No hemorrhage or parenchymal lacerations identified. No evidence of suspicious renal masses or hydronephrosis. Unremarkable unopacified urinary bladder. Stomach/Bowel: Unopacified bowel loops are unremarkable in appearance. No evidence of hemoperitoneum. Vascular/Lymphatic: No evidence of abdominal aortic injury or retroperitoneal hemorrhage. Aortic atherosclerotic calcification incidentally noted. No pathologically enlarged lymph nodes identified. Reproductive:  No mass or other significant abnormality identified. Other:  Stable small left inguinal hernia, which contains only fat. Musculoskeletal: No acute fractures or suspicious bone lesions identified. IMPRESSION: No evidence of visceral injury, hemorrhage, or other acute findings within the abdomen or pelvis. New multifocal airspace opacities in both lower lobes, and tiny bilateral pleural effusions. Differential diagnosis includes pulmonary contusion, aspiration, and pneumonia. Stable small left inguinal hernia, which contains only fat. Electronically Signed   By: Danae Orleans M.D.    On: 04/27/2023 16:53   DG Chest 2 View  Result Date: 04/27/2023 CLINICAL DATA:  Weakness EXAM: CHEST - 2 VIEW COMPARISON:  Chest x-ray dated March 12, 2023 FINDINGS: Cardiac and mediastinal contours are unchanged status post median sternotomy. Increased diffuse interstitial opacities. Trace bilateral pleural effusions. No evidence of pneumothorax. IMPRESSION: 1. Increased diffuse interstitial opacities, consistent with worsened pulmonary edema. 2. Trace bilateral pleural effusions. Electronically Signed   By: Allegra Lai M.D.   On: 04/27/2023 16:50   CT HEAD WO CONTRAST ( )  Result Date: 04/27/2023 CLINICAL DATA:  Mental status change, unknown cause.  Fall. EXAM: CT HEAD WITHOUT CONTRAST TECHNIQUE: Contiguous axial images were obtained from the base of the skull through the vertex without intravenous contrast. RADIATION DOSE REDUCTION: This exam was performed according to the departmental dose-optimization program which includes automated exposure control, adjustment of the mA and/or kV according to patient size and/or use of iterative reconstruction technique. COMPARISON:  Head CT 03/27/2023 and MRI 03/28/2023 FINDINGS: Brain: There is no evidence of an acute infarct, intracranial hemorrhage, mass, midline shift, or extra-axial fluid collection. Mild cerebral atrophy is within normal limits for age. Cerebral white matter hypodensities are unchanged and nonspecific but compatible with mild chronic small vessel ischemic disease. Vascular: No hyperdense vessel. Skull: No acute fracture or suspicious osseous lesion. Sinuses/Orbits: Mild mucosal thickening in the paranasal sinuses. No significant mastoid fluid. Unremarkable orbits. Other: None. IMPRESSION: 1. No evidence of acute intracranial abnormality. 2. Mild chronic small vessel ischemic disease. Electronically Signed   By: Sebastian Ache M.D.   On: 04/27/2023 16:35    Microbiology: Results for orders placed or performed during the hospital  encounter of 12/26/22  MRSA Next Gen by PCR, Nasal     Status: None   Collection Time: 12/28/22 12:59 AM   Specimen: Nasal Mucosa; Nasal  Swab  Result Value Ref Range Status   MRSA by PCR Next Gen NOT DETECTED NOT DETECTED Final    Comment: (NOTE) The GeneXpert MRSA Assay (FDA approved for NASAL specimens only), is one component of a comprehensive MRSA colonization surveillance program. It is not intended to diagnose MRSA infection nor to guide or monitor treatment for MRSA infections. Test performance is not FDA approved in patients less than 29 years old. Performed at [facility], [address] [place], [place] [address]    *Note: Due to a large number of results and/or encounters for the requested time period, some results have not been displayed. A complete set of results can be found in Results Review.    Labs: CBC: Recent Labs  Lab 04/28/23 0640 05/01/23 0357 05/02/23 0718 05/03/23 0345 05/04/23 0428  WBC 6.3 5.3 6.1 6.8 6.6  HGB 11.7* 13.9 13.6 12.9* 12.5*  HCT 37.7* 44.3 45.1 41.8 41.5  MCV 87.5 87.4 88.4 87.6 88.9  PLT 135* 173 161 166 172   Basic Metabolic Panel: Recent Labs  Lab 04/28/23 0640 05/01/23 0357 05/02/23 0718 05/03/23 0345 05/04/23 0428  NA 141 144 137 138 134*  K 3.4* 3.7 3.7 3.8 3.7  CL 102 100 92* 93* 90*  CO2 30 35* 36* 37* 37*  GLUCOSE 88 114* 137* 186* 125*  BUN 16 8 11 12 12   CREATININE 0.88 0.88 1.00 1.04 1.01  CALCIUM 7.9* 8.9 8.8* 8.7* 8.8*  MG  --  1.8 1.6* 2.1  --   PHOS  --  3.8 3.8 3.4  --    Liver Function Tests: Recent Labs  Lab 04/27/23 1507  AST 18  ALT 18  ALKPHOS 53  BILITOT 0.8  PROT 6.0*  ALBUMIN 3.0*   CBG: Recent Labs  Lab 05/03/23 0752 05/03/23 1147 05/03/23 1511 05/03/23 1947 05/04/23 0742  GLUCAP 147* 136* 109* 152* 141*    Discharge time spent: greater than 30 minutes.  Signed: Enedina Finner, MD [facility] 05/04/2023

## 2023-05-05 ENCOUNTER — Telehealth: Payer: Self-pay

## 2023-05-05 ENCOUNTER — Telehealth: Payer: Self-pay | Admitting: *Deleted

## 2023-05-05 NOTE — Transitions of Care (Post Inpatient/ED Visit) (Signed)
05/05/2023  Name: Lawrence [name] MRN: [id number] DOB: [birth year]  Today's TOC FU Call Status: Today's TOC FU Call Status:: Successful TOC FU Call Competed  Transition Care Management Follow-up Telephone Call Date of Discharge: 05/04/23 Discharge Facility: [facility]) Type of Discharge: Inpatient Admission Primary Inpatient Discharge Diagnosis:: Symptomatic anemia How have you been since you were released from the hospital?: Better Any questions or concerns?: Yes Patient Questions/Concerns:: they were going to deliver a bsc yesterdy but I wasn't available. I need to get in touch with them. Patient Questions/Concerns Addressed: Other: (RN contact [employer])  Items Reviewed: Did you receive and understand the discharge instructions provided?: Yes Medications obtained,verified, and reconciled?: Yes (Medications Reviewed) (I will have to contact the pharmacy and they will have to deliver it for me.) Any new allergies since your discharge?: No Dietary orders reviewed?: No Do you have support at home?: Yes People in Home: alone Name of Support/Comfort Primary Source: Son [name]  Medications Reviewed Today: Medications Reviewed Today     Reviewed by Luella Cook, RN (Case Manager) on 05/05/23 at 1215  Med List Status: <None>   Medication Order Taking? Sig Documenting Provider Last Dose Status Informant  Accu-Chek Softclix Lancets lancets [id number]  Use as instructed to check sugar daily for type 2 diabetes. Malva Limes, MD  Active Self, Pharmacy Records, Multiple Informants  acetaminophen (TYLENOL) 325 MG tablet [id number] Yes Take 2 tablets (650 mg total) by mouth every 4 (four) hours as needed for headache or mild pain. Rolly Salter, MD Taking Active Self, Pharmacy Records, Multiple Informants  albuterol (VENTOLIN HFA) 108 (90 Base) MCG/ACT inhaler [id number] Yes INHALE 2 PUFFS BY MOUTH EVERY 6 HOURS AS NEEDED FOR SHORTNESS OF [name], MD Taking Active Self, Pharmacy Records, Multiple Informants  allopurinol (ZYLOPRIM) 300 MG tablet [id number] Yes TAKE 1 TABLET BY MOUTH TWICE A DAY Malva Limes, MD Taking Active Self, Pharmacy Records, Multiple Informants  ALPRAZolam Prudy Feeler) 1 MG tablet [id number] Yes Take 1 mg by mouth 3 (three) times daily as needed. [provider] Taking Active Self, Pharmacy Records, Multiple Informants  amiodarone (PACERONE) 200 MG tablet [id number] Yes Take 1 tablet (200 mg total) by mouth daily. Enedina Finner, MD Taking Active   atorvastatin (LIPITOR) 80 MG tablet [id number] Yes Take 1 tablet (80 mg total) by mouth daily. Please schedule office visit before any future refill. Malva Limes, MD Taking Active Self, Pharmacy Records, Multiple Informants  Blood Glucose Calibration (ACCU-CHEK GUIDE CONTROL) [name] [id number]  Use with blood glucose monitor as directed [name], Demetrios Isaacs, MD  Active Self, Pharmacy Records, Multiple Informants  Blood Glucose Monitoring Suppl (ACCU-CHEK GUIDE) w/Device KIT [id number]  Use to check blood sugars as directed [name], Demetrios Isaacs, MD  Active Self, Pharmacy Records, Multiple Informants  Budeson-Glycopyrrol-Formoterol (BREZTRI AEROSPHERE) 160-9-4.8 MCG/ACT [name] [id number] Yes Inhale 2 puffs into the lungs in the morning and at bedtime. [name], MD Taking Active Self, Pharmacy Records, Multiple Informants  celecoxib (CELEBREX) 200 MG capsule [id number] Yes Take 1 capsule (200 mg total) by mouth 2 (two) times daily as needed. Home med. [name], MD Taking Active Self, Pharmacy Records, Multiple Informants  cetirizine (ZYRTEC) 10 MG tablet [id number] Yes TAKE 1 TABLET BY MOUTH AT BEDTIME Malva Limes, MD Taking Active Self, Pharmacy Records, Multiple Informants  clopidogrel (PLAVIX) 75 MG tablet [id number] Yes Take 1 tablet (75 mg total) by mouth daily. Enedina Finner, MD Taking Active   [facility]  5 MG TABS tablet [id number] Yes TAKE 1 TABLET BY MOUTH TWICE A DAY  [name], Demetrios Isaacs, MD Taking Active Self, Pharmacy Records, Multiple Informants           Med Note [name] Mar 28, 2023  9:02 AM)    glipiZIDE (GLUCOTROL) 5 MG tablet [id number] Yes Take 1 tablet (5 mg total) by mouth daily as needed (sugar over 300). Malva Limes, MD Taking Active Self, Pharmacy Records, Multiple Informants  glucose blood (ACCU-CHEK GUIDE) test strip [id number] Yes Use as instructed to check sugar daily for type 2 diabetes. Malva Limes, MD Taking Active Self, Pharmacy Records, Multiple Informants  iron polysaccharides (NIFEREX) 150 MG capsule [id number] Yes Take 1 capsule (150 mg total) by mouth daily. Enedina Finner, MD Taking Active   isosorbide mononitrate (IMDUR) 120 MG 24 hr tablet [id number] Yes Take 1 tablet (120 mg total) by mouth daily. Enedina Finner, MD Taking Active   lisinopril (ZESTRIL) 20 MG tablet [id number] Yes Take 20 mg by mouth daily. [provider] Taking Active Self, Pharmacy Records, Multiple Informants  meclizine (ANTIVERT) 25 MG tablet [id number] Yes TAKE ONE TABLET BY THREE TIMES A DAY AS NEEDED FOR DIZZINESS Malva Limes, MD Taking Active Self, Pharmacy Records, Multiple Informants  metFORMIN (GLUCOPHAGE-XR) 500 MG 24 hr tablet [id number] Yes TAKE 1 TABLET BY MOUTH TWICE A DAY Malva Limes, MD Taking Active Self, Pharmacy Records, Multiple Informants  methocarbamol (ROBAXIN) 500 MG tablet [id number] Yes Take 500 mg by mouth every 8 (eight) hours as needed for muscle spasms. Take 1 tablet by mouth every hour as needed for muscle spasms [name], Demetrios Isaacs, MD Taking Active Self, Pharmacy Records, Multiple Informants  metoprolol succinate (TOPROL XL) 50 MG 24 hr tablet [id number] Yes Take 1 tablet (50 mg total) by mouth daily. Take with or immediately following a meal. Enedina Finner, MD Taking Active   naloxone [facility]) nasal spray 4 mg/0.1 mL [id number] Yes 1 spray into nostril x1 and may repeat every 2-3 minutes until patient is  responsive or EMS arrives Malva Limes, MD Taking Active Self, Pharmacy Records, Multiple Informants  nitroGLYCERIN (NITROSTAT) 0.4 MG SL tablet [id number] Yes Place 1 tablet (0.4 mg total) under the tongue every 5 (five) minutes as needed for chest pain. Alfredia Ferguson, PA-C Taking Active Self, Pharmacy Records, Multiple Informants  omega-3 acid ethyl esters (LOVAZA) 1 g capsule [id number] Yes Take 4 capsules (4 g total) by mouth daily. Alfredia Ferguson, PA-C Taking Active Self, Pharmacy Records, Multiple Informants  omeprazole (PRILOSEC) 40 MG capsule [id number] Yes TAKE 1 CAPSULE BY MOUTH 2 TIMES DAILY 30 MINUTES BEFORE BREAKFAST AND DINNER Malva Limes, MD Taking Active Self, Pharmacy Records, Multiple Informants  oxyCODONE-acetaminophen (PERCOCET) 10-325 MG tablet [id number] Yes TAKE 1 TABLET BY MOUTH EVERY 4 HOURS AS NEEDED FOR PAIN Malva Limes, MD Taking Active Self, Pharmacy Records, Multiple Informants  pantoprazole (PROTONIX) 40 MG tablet [id number]  Take 1 tablet (40 mg total) by mouth daily. Enedina Finner, MD  Active   ranolazine (RANEXA) 1000 MG SR tablet [id number]  Take 1 tablet (1,000 mg total) by mouth 2 (two) times daily. Arnetha Courser, MD  Active Self, Pharmacy Records, Multiple Informants  torsemide [facility]) 100 MG tablet [id number]  Take 0.5 tablets (50 mg total) by mouth daily. Alfredia Ferguson, PA-C  Active Self, Pharmacy Records, Multiple Informants  traZODone (DESYREL) 150 MG tablet [id number] Yes TAKE 1 TABLET BY MOUTH AT BEDTIME Sherrie Mustache Demetrios Isaacs, MD  Taking Active Self, Pharmacy Records, Multiple Informants  venlafaxine XR (EFFEXOR-XR) 75 MG 24 hr capsule [id number] Yes TAKE 1 CAPSULE BY MOUTH DAILY WITH BREAKFAST [name], Demetrios Isaacs, MD Taking Active Self, Pharmacy Records, Multiple Informants            Home Care and Equipment/Supplies: Were Home Health Services Ordered?: Yes Name of Home Health Agency:: [facility] Has Agency set up a time to come to your home?: No EMR  reviewed for Home Health Orders: Orders present/patient has not received call (refer to CM for follow-up) Any new equipment or medical supplies ordered?: Yes Name of Medical supply agency?: [employer] Were you able to get the equipment/medical supplies?: No (They came and pt was unavailable. RN called [employer] for delivery today) Do you have any questions related to the use of the equipment/supplies?: No  Functional Questionnaire: Do you need assistance with bathing/showering or dressing?: Yes Do you need assistance with meal preparation?: Yes Do you need assistance with eating?: No Do you have difficulty maintaining continence: No Do you need assistance with getting out of bed/getting out of a chair/moving?: Yes Do you have difficulty managing or taking your medications?: No  Follow up appointments reviewed: PCP Follow-up appointment confirmed?: Yes Date of PCP follow-up appointment?: 05/07/23 Follow-up Provider: Merita Norton Specialist [facility] Follow-up appointment confirmed?: Yes Date of Specialist follow-up appointment?: 05/14/23 Follow-Up Specialty Provider:: [id number] Dr Juliann Pares, [id number] [place], [id number] Rad[place] Pax Do you need transportation to your follow-up appointment?: No Do you understand care options if your condition(s) worsen?: Yes-patient verbalized understanding  SDOH Interventions Today    Flowsheet Row Most Recent Value  SDOH Interventions   Food Insecurity Interventions Intervention Not Indicated  Housing Interventions Intervention Not Indicated  Transportation Interventions Intervention Not Indicated, Patient Resources (Friends/Family)      Interventions Today    Flowsheet Row Most Recent Value  General Interventions   General Interventions Discussed/Reviewed General Interventions Discussed, General Interventions Reviewed, Doctor Visits, Referral to Nurse  Freada Bergeron to [facility]]  Doctor Visits Discussed/Reviewed Doctor Visits Discussed, Doctor Visits  Reviewed  Pharmacy Interventions   Pharmacy Dicussed/Reviewed Pharmacy Topics Discussed, Pharmacy Topics Reviewed  [Patient will contact his pharmacy to deliver his prescriptions since he is unable to go and get them himself]      TOC Interventions Today    Flowsheet Row Most Recent Value  TOC Interventions   TOC Interventions Discussed/Reviewed Arranged PCP follow up within 7 days/Care Guide scheduled, TOC Interventions Discussed, TOC Interventions Reviewed, Contact DME company for patient use of equipment  [contacted [employer] to delive BSC]       Gean Maidens BSN RN [facility] [phone]

## 2023-05-05 NOTE — Telephone Encounter (Signed)
Called and left a message for call back  

## 2023-05-05 NOTE — Telephone Encounter (Signed)
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  Patient should be off prilosec and H2 blocker atleast for 2weeks before the test  Thanks RV

## 2023-05-05 NOTE — Telephone Encounter (Signed)
Copied from CRM [id number]. Topic: General - Other >> May 05, 2023  2:20 PM Dondra Prader E wrote: Reason for CRM: Pt called requesting to speak to a nurse regarding getting orders for a portable lightweight oxygen machine, please advise

## 2023-05-06 ENCOUNTER — Telehealth: Payer: Self-pay

## 2023-05-06 NOTE — Telephone Encounter (Signed)
Copied from CRM [id number]. Topic: General - Inquiry >> May 06, 2023 12:57 PM De Blanch wrote: Reason for CRM: Dewana from [facility] is calling to let Dr. Sherrie Mustache know they will be out Saturday, the 22nd, to begin in-home skilled nursing and therapy services.  Please advise.

## 2023-05-06 NOTE — Telephone Encounter (Signed)
Called and left a message for call back  

## 2023-05-07 ENCOUNTER — Ambulatory Visit (INDEPENDENT_AMBULATORY_CARE_PROVIDER_SITE_OTHER): Payer: Self-pay | Admitting: Family Medicine

## 2023-05-07 DIAGNOSIS — Z91199 Patient's noncompliance with other medical treatment and regimen due to unspecified reason: Secondary | ICD-10-CM

## 2023-05-07 NOTE — Progress Notes (Signed)
Patient was not seen for appt d/t no call, no show, or late arrival >10 mins past appt time.   Zoii Florer T Chelsea Nusz, FNP  [facility] [address] [place], [place] [address] [phone] (phone) [phone] (fax) [facility]  

## 2023-05-07 NOTE — Telephone Encounter (Signed)
Called and left a message for call back.  Sent letter to patient  Please advise if anything else you recommends

## 2023-05-07 NOTE — Telephone Encounter (Signed)
Called and left a message for call back.  Sent letter to patient  Please advise if anything else you recommends  

## 2023-05-08 DIAGNOSIS — E1151 Type 2 diabetes mellitus with diabetic peripheral angiopathy without gangrene: Secondary | ICD-10-CM | POA: Diagnosis not present

## 2023-05-08 DIAGNOSIS — I480 Paroxysmal atrial fibrillation: Secondary | ICD-10-CM | POA: Diagnosis not present

## 2023-05-08 DIAGNOSIS — I25110 Atherosclerotic heart disease of native coronary artery with unstable angina pectoris: Secondary | ICD-10-CM | POA: Diagnosis not present

## 2023-05-08 DIAGNOSIS — N182 Chronic kidney disease, stage 2 (mild): Secondary | ICD-10-CM | POA: Diagnosis not present

## 2023-05-08 DIAGNOSIS — I5023 Acute on chronic systolic (congestive) heart failure: Secondary | ICD-10-CM | POA: Diagnosis not present

## 2023-05-08 DIAGNOSIS — E1122 Type 2 diabetes mellitus with diabetic chronic kidney disease: Secondary | ICD-10-CM | POA: Diagnosis not present

## 2023-05-08 DIAGNOSIS — I5032 Chronic diastolic (congestive) heart failure: Secondary | ICD-10-CM | POA: Diagnosis not present

## 2023-05-08 DIAGNOSIS — I130 Hypertensive heart and chronic kidney disease with heart failure and stage 1 through stage 4 chronic kidney disease, or unspecified chronic kidney disease: Secondary | ICD-10-CM | POA: Diagnosis not present

## 2023-05-08 DIAGNOSIS — J449 Chronic obstructive pulmonary disease, unspecified: Secondary | ICD-10-CM | POA: Diagnosis not present

## 2023-05-10 ENCOUNTER — Ambulatory Visit: Payer: Self-pay | Admitting: *Deleted

## 2023-05-10 ENCOUNTER — Telehealth: Payer: Self-pay | Admitting: Family Medicine

## 2023-05-10 DIAGNOSIS — I480 Paroxysmal atrial fibrillation: Secondary | ICD-10-CM | POA: Diagnosis not present

## 2023-05-10 DIAGNOSIS — I25110 Atherosclerotic heart disease of native coronary artery with unstable angina pectoris: Secondary | ICD-10-CM | POA: Diagnosis not present

## 2023-05-10 DIAGNOSIS — I130 Hypertensive heart and chronic kidney disease with heart failure and stage 1 through stage 4 chronic kidney disease, or unspecified chronic kidney disease: Secondary | ICD-10-CM | POA: Diagnosis not present

## 2023-05-10 DIAGNOSIS — I5023 Acute on chronic systolic (congestive) heart failure: Secondary | ICD-10-CM | POA: Diagnosis not present

## 2023-05-10 DIAGNOSIS — J449 Chronic obstructive pulmonary disease, unspecified: Secondary | ICD-10-CM | POA: Diagnosis not present

## 2023-05-10 DIAGNOSIS — I5032 Chronic diastolic (congestive) heart failure: Secondary | ICD-10-CM | POA: Diagnosis not present

## 2023-05-10 DIAGNOSIS — E1122 Type 2 diabetes mellitus with diabetic chronic kidney disease: Secondary | ICD-10-CM | POA: Diagnosis not present

## 2023-05-10 DIAGNOSIS — E1151 Type 2 diabetes mellitus with diabetic peripheral angiopathy without gangrene: Secondary | ICD-10-CM | POA: Diagnosis not present

## 2023-05-10 DIAGNOSIS — N182 Chronic kidney disease, stage 2 (mild): Secondary | ICD-10-CM | POA: Diagnosis not present

## 2023-05-10 NOTE — Telephone Encounter (Signed)
  Chief Complaint: Having black stools that he noticed this morning being very black.   He was started on iron pills while in the hospital last week.   Blood was found in his stool during the rectal exam.   Was not having bloody or black stools prior to going to the hospital/ED Symptoms: Black stool this morning Frequency: This morning X2 Pertinent Negatives: Patient denies abd pain, vomiting, constipation, diarrhea or dizziness.    Is having nausea Disposition: [] ED /[] Urgent Care (no appt availability in office) / [] Appointment(In office/virtual)/ []  [place] [facility]/ [] Home Care/ [] Refused Recommended Disposition /[] [place] [facility]/ [x]  Follow-up with PCP Additional Notes: He is wanting to know if the iron pills are what is causing his black stools and nausea.   Denies any other symptoms.     Message sent to Dr. Sherrie Mustache.   Pt agreeable to someone calling him back if Dr. Sherrie Mustache wants him to be seen.    He had an endoscopy and colonoscopy done last week in the hospital and "they didn't find anything".

## 2023-05-10 NOTE — Telephone Encounter (Signed)
Home Health Verbal Orders - Caller/Agency: Judeth Cornfield from [facility] Callback Number: [phone] Requesting OT Frequency: 1x6

## 2023-05-10 NOTE — Telephone Encounter (Signed)
Reason for Disposition  Unusual stool color probably from food or medicine  Answer Assessment - Initial Assessment Questions 1. SYMPTOM:  "What's the main symptom you're concerned about?" (e.g., pain, itching, swelling, rash)     I'm having black stools.   I was in the hospital a week ago.   I had blood in my stool when they checked me in the ED.   I fell and could not get up.   The ambulance came and took me to the hospital.   That's when they found I had blood in my stool when they did the rectal exam.  Did a colonoscopy and did not see anything.   If I'm on a iron pill they started me on.   Could that be causing the black stool?    Yes iron can cause black stools I let him know.    I'm weak but not dizzy.    I was dizzy in the past that's why  I'm on oxygen and a walker now.  2. ONSET: "When did the black stool  start?"     Today is the first time I've noticed it being black black.     They found blood in my stool in the ED and then I got admitted to the hospital.   They did the scope down my throat and the colonoscopy.    Nothing was found. 3. RECTAL PAIN: "Do you have any pain around your rectum?" "How bad is the pain?"  (Scale 0-10; or mild, moderate, severe)   - NONE (0): no pain   - MILD (1-3): doesn't interfere with normal activities    - MODERATE (4-7): interferes with normal activities or awakens from sleep, limping    - SEVERE (8-10): excruciating pain, unable to have a bowel movement      No abd pain or rectal pain.    I've been to the bathroom twice without pain this morning.   4. RECTAL ITCHING: "Do you have any itching in this area?" "How bad is the itching?"  (Scale 0-10; or mild, moderate, severe)   - NONE: no itching   - MILD: doesn't interfere with normal activities    - MODERATE-SEVERE: interferes with normal activities or awakens from sleep     No  5. CONSTIPATION: "Do you have constipation?" If Yes, ask: "How bad is it?"     No 6. CAUSE: "What do you think is causing the  anus symptoms?"     The iron pill 7. OTHER SYMPTOMS: "Do you have any other symptoms?"  (e.g., abdomen pain, fever, rectal bleeding, vomiting)     I'm having nausea.   No diarrhea or vomiting. 8. PREGNANCY: "Is there any chance you are pregnant?" "When was your last menstrual period?"     N/A  Answer Assessment - Initial Assessment Questions 1. COLOR: "What color is it?" "Is that color in part or all of the stool?"     I'm having black stools.   This morning is the first time I've noticed it being black black.   I was in the hospital a week ago because they found blood in my stool while I was in the ED.   They found the blood during the rectal exam.   I had fallen and could not get up.    The ambulance brought me to the ED is the reason I was there. 2. ONSET: "When was the unusual color first noted?"     I just noticed it being  black black this morning.    They started me on iron pills while I was in the hospital.   Can iron pills make my stool black?   I'm also having nausea.    I let him know that iron pills can cause black stools and nausea.    Also constipation.   He denies having constipation, diarrhea or vomiting or abd pain. 3. CAUSE: "Have you eaten any food or taken any medicine of this color?" Note: See listing in Background Information section.      Yes iron pills 4. OTHER SYMPTOMS: "Do you have any other symptoms?" (e.g., abdomen pain, diarrhea, jaundice, fever).     nausea  Protocols used: Rectal Symptoms-A-AH, Stools - Unusual Color-A-AH

## 2023-05-11 ENCOUNTER — Telehealth: Payer: Self-pay | Admitting: Family Medicine

## 2023-05-11 DIAGNOSIS — J9611 Chronic respiratory failure with hypoxia: Secondary | ICD-10-CM

## 2023-05-11 NOTE — Telephone Encounter (Signed)
Orders given.  

## 2023-05-11 NOTE — Telephone Encounter (Signed)
Pt is calling in because he needs a Regulator so he doesn't have to haul a large tank around with him. Pt spoke with [facility] and they advised pt to contact his doctor to change his order to a regulator instead of the tanks for oxygen. Please advise. Pt is requesting a call back.

## 2023-05-11 NOTE — Telephone Encounter (Signed)
LVMTCB. CRM created. Ok for [name] to advise 

## 2023-05-14 ENCOUNTER — Ambulatory Visit: Payer: Self-pay

## 2023-05-14 ENCOUNTER — Emergency Department: Payer: Medicare HMO

## 2023-05-14 ENCOUNTER — Emergency Department
Admission: EM | Admit: 2023-05-14 | Discharge: 2023-05-15 | Disposition: A | Discharge status: Home / Self Care | Payer: Medicare HMO | Source: Home / Self Care | Attending: Emergency Medicine | Admitting: Emergency Medicine

## 2023-05-14 ENCOUNTER — Other Ambulatory Visit: Payer: Self-pay

## 2023-05-14 DIAGNOSIS — I5033 Acute on chronic diastolic (congestive) heart failure: Secondary | ICD-10-CM | POA: Diagnosis not present

## 2023-05-14 DIAGNOSIS — E119 Type 2 diabetes mellitus without complications: Secondary | ICD-10-CM | POA: Diagnosis not present

## 2023-05-14 DIAGNOSIS — N182 Chronic kidney disease, stage 2 (mild): Secondary | ICD-10-CM | POA: Diagnosis not present

## 2023-05-14 DIAGNOSIS — E782 Mixed hyperlipidemia: Secondary | ICD-10-CM | POA: Diagnosis present

## 2023-05-14 DIAGNOSIS — A044 Other intestinal Escherichia coli infections: Secondary | ICD-10-CM | POA: Insufficient documentation

## 2023-05-14 DIAGNOSIS — J449 Chronic obstructive pulmonary disease, unspecified: Secondary | ICD-10-CM | POA: Diagnosis not present

## 2023-05-14 DIAGNOSIS — R109 Unspecified abdominal pain: Secondary | ICD-10-CM | POA: Insufficient documentation

## 2023-05-14 DIAGNOSIS — I25709 Atherosclerosis of coronary artery bypass graft(s), unspecified, with unspecified angina pectoris: Secondary | ICD-10-CM | POA: Diagnosis not present

## 2023-05-14 DIAGNOSIS — Z8249 Family history of ischemic heart disease and other diseases of the circulatory system: Secondary | ICD-10-CM | POA: Diagnosis not present

## 2023-05-14 DIAGNOSIS — K529 Noninfective gastroenteritis and colitis, unspecified: Secondary | ICD-10-CM | POA: Diagnosis not present

## 2023-05-14 DIAGNOSIS — Z7984 Long term (current) use of oral hypoglycemic drugs: Secondary | ICD-10-CM | POA: Diagnosis not present

## 2023-05-14 DIAGNOSIS — R0789 Other chest pain: Secondary | ICD-10-CM | POA: Diagnosis not present

## 2023-05-14 DIAGNOSIS — R079 Chest pain, unspecified: Secondary | ICD-10-CM | POA: Diagnosis not present

## 2023-05-14 DIAGNOSIS — I340 Nonrheumatic mitral (valve) insufficiency: Secondary | ICD-10-CM | POA: Diagnosis present

## 2023-05-14 DIAGNOSIS — Z87891 Personal history of nicotine dependence: Secondary | ICD-10-CM | POA: Diagnosis not present

## 2023-05-14 DIAGNOSIS — Z7901 Long term (current) use of anticoagulants: Secondary | ICD-10-CM | POA: Diagnosis not present

## 2023-05-14 DIAGNOSIS — J811 Chronic pulmonary edema: Secondary | ICD-10-CM | POA: Diagnosis not present

## 2023-05-14 DIAGNOSIS — Z85828 Personal history of other malignant neoplasm of skin: Secondary | ICD-10-CM | POA: Diagnosis not present

## 2023-05-14 DIAGNOSIS — I5023 Acute on chronic systolic (congestive) heart failure: Secondary | ICD-10-CM | POA: Diagnosis not present

## 2023-05-14 DIAGNOSIS — J4489 Other specified chronic obstructive pulmonary disease: Secondary | ICD-10-CM | POA: Diagnosis not present

## 2023-05-14 DIAGNOSIS — Z79899 Other long term (current) drug therapy: Secondary | ICD-10-CM | POA: Diagnosis not present

## 2023-05-14 DIAGNOSIS — I110 Hypertensive heart disease with heart failure: Secondary | ICD-10-CM | POA: Diagnosis not present

## 2023-05-14 DIAGNOSIS — R1084 Generalized abdominal pain: Secondary | ICD-10-CM | POA: Diagnosis not present

## 2023-05-14 DIAGNOSIS — I4891 Unspecified atrial fibrillation: Secondary | ICD-10-CM | POA: Diagnosis not present

## 2023-05-14 DIAGNOSIS — G8929 Other chronic pain: Secondary | ICD-10-CM | POA: Diagnosis present

## 2023-05-14 DIAGNOSIS — J90 Pleural effusion, not elsewhere classified: Secondary | ICD-10-CM | POA: Diagnosis not present

## 2023-05-14 DIAGNOSIS — I480 Paroxysmal atrial fibrillation: Secondary | ICD-10-CM | POA: Diagnosis not present

## 2023-05-14 DIAGNOSIS — K219 Gastro-esophageal reflux disease without esophagitis: Secondary | ICD-10-CM | POA: Diagnosis not present

## 2023-05-14 DIAGNOSIS — I2489 Other forms of acute ischemic heart disease: Secondary | ICD-10-CM | POA: Diagnosis not present

## 2023-05-14 DIAGNOSIS — I10 Essential (primary) hypertension: Secondary | ICD-10-CM | POA: Diagnosis not present

## 2023-05-14 DIAGNOSIS — Z66 Do not resuscitate: Secondary | ICD-10-CM | POA: Diagnosis not present

## 2023-05-14 DIAGNOSIS — I25118 Atherosclerotic heart disease of native coronary artery with other forms of angina pectoris: Secondary | ICD-10-CM | POA: Diagnosis not present

## 2023-05-14 DIAGNOSIS — Z951 Presence of aortocoronary bypass graft: Secondary | ICD-10-CM | POA: Diagnosis not present

## 2023-05-14 DIAGNOSIS — Z955 Presence of coronary angioplasty implant and graft: Secondary | ICD-10-CM | POA: Diagnosis not present

## 2023-05-14 DIAGNOSIS — J9611 Chronic respiratory failure with hypoxia: Secondary | ICD-10-CM | POA: Diagnosis not present

## 2023-05-14 DIAGNOSIS — K4090 Unilateral inguinal hernia, without obstruction or gangrene, not specified as recurrent: Secondary | ICD-10-CM | POA: Diagnosis not present

## 2023-05-14 DIAGNOSIS — R001 Bradycardia, unspecified: Secondary | ICD-10-CM | POA: Diagnosis present

## 2023-05-14 DIAGNOSIS — R0602 Shortness of breath: Secondary | ICD-10-CM | POA: Diagnosis not present

## 2023-05-14 DIAGNOSIS — I214 Non-ST elevation (NSTEMI) myocardial infarction: Secondary | ICD-10-CM | POA: Diagnosis not present

## 2023-05-14 DIAGNOSIS — Z743 Need for continuous supervision: Secondary | ICD-10-CM | POA: Diagnosis not present

## 2023-05-14 DIAGNOSIS — E1121 Type 2 diabetes mellitus with diabetic nephropathy: Secondary | ICD-10-CM | POA: Diagnosis not present

## 2023-05-14 DIAGNOSIS — Z87442 Personal history of urinary calculi: Secondary | ICD-10-CM | POA: Diagnosis not present

## 2023-05-14 DIAGNOSIS — K5730 Diverticulosis of large intestine without perforation or abscess without bleeding: Secondary | ICD-10-CM | POA: Diagnosis not present

## 2023-05-14 LAB — COMPREHENSIVE METABOLIC PANEL
ALT: 16 U/L (ref 0–44)
AST: 18 U/L (ref 15–41)
Albumin: 3.8 g/dL (ref 3.5–5.0)
Alkaline Phosphatase: 54 U/L (ref 38–126)
Anion gap: 8 (ref 5–15)
BUN: 14 mg/dL (ref 8–23)
CO2: 28 mmol/L (ref 22–32)
Calcium: 9.7 mg/dL (ref 8.9–10.3)
Chloride: 103 mmol/L (ref 98–111)
Creatinine, Ser: 1.04 mg/dL (ref 0.61–1.24)
GFR, Estimated: >60 mL/min (ref >60)
Glucose, Bld: 106 mg/dL — ABNORMAL HIGH (ref 70–99)
Potassium: 5.2 mmol/L — ABNORMAL HIGH (ref 3.5–5.1)
Sodium: 139 mmol/L (ref 135–145)
Total Bilirubin: 0.6 mg/dL (ref 0.3–1.2)
Total Protein: 7 g/dL (ref 6.5–8.1)

## 2023-05-14 LAB — URINALYSIS, ROUTINE W REFLEX MICROSCOPIC
Bilirubin Urine: NEGATIVE
Glucose, UA: >=500 mg/dL — AB
Hgb urine dipstick: NEGATIVE
Ketones, ur: NEGATIVE mg/dL
Leukocytes,Ua: NEGATIVE
Nitrite: NEGATIVE
Protein, ur: NEGATIVE mg/dL
Specific Gravity, Urine: 1.003 — ABNORMAL LOW (ref 1.005–1.030)
Squamous Epithelial / HPF: NONE SEEN /HPF (ref 0–5)
pH: 5 (ref 5.0–8.0)

## 2023-05-14 LAB — GASTROINTESTINAL PANEL BY PCR, STOOL (REPLACES STOOL CULTURE)
Adenovirus F40/41: NOT DETECTED
Astrovirus: NOT DETECTED
Campylobacter species: NOT DETECTED
Cryptosporidium: NOT DETECTED
Cyclospora cayetanensis: NOT DETECTED
Entamoeba histolytica: NOT DETECTED
Enteroaggregative E coli (EAEC): DETECTED — AB
Enteropathogenic E coli (EPEC): NOT DETECTED
Enterotoxigenic E coli (ETEC): NOT DETECTED
Giardia lamblia: NOT DETECTED
Norovirus GI/GII: NOT DETECTED
Plesimonas shigelloides: NOT DETECTED
Rotavirus A: NOT DETECTED
Salmonella species: NOT DETECTED
Sapovirus (I, II, IV, and V): NOT DETECTED
Shiga like toxin producing E coli (STEC): NOT DETECTED
Shigella/Enteroinvasive E coli (EIEC): NOT DETECTED
Vibrio cholerae: NOT DETECTED
Vibrio species: NOT DETECTED
Yersinia enterocolitica: NOT DETECTED

## 2023-05-14 LAB — CBC
HCT: 46.1 % (ref 39.0–52.0)
Hemoglobin: 14 g/dL (ref 13.0–17.0)
MCH: 27.8 pg (ref 26.0–34.0)
MCHC: 30.4 g/dL (ref 30.0–36.0)
MCV: 91.5 fL (ref 80.0–100.0)
Platelets: 200 10*3/uL (ref 150–400)
RBC: 5.04 MIL/uL (ref 4.22–5.81)
RDW: 19 % — ABNORMAL HIGH (ref 11.5–15.5)
WBC: 6.5 10*3/uL (ref 4.0–10.5)
nRBC: 0 % (ref 0.0–0.2)

## 2023-05-14 LAB — C DIFFICILE QUICK SCREEN W PCR REFLEX
C Diff antigen: NEGATIVE
C Diff interpretation: NOT DETECTED
C Diff toxin: NEGATIVE

## 2023-05-14 LAB — LIPASE, BLOOD: Lipase: 28 U/L (ref 11–51)

## 2023-05-14 LAB — TROPONIN I (HIGH SENSITIVITY): Troponin I (High Sensitivity): 37 ng/L — ABNORMAL HIGH (ref <18)

## 2023-05-14 MED ORDER — ONDANSETRON HCL 4 MG PO TABS: 4.0000 mg | ORAL_TABLET | Freq: Three times a day (TID) | ORAL | 0 refills | Status: DC | PRN

## 2023-05-14 MED ORDER — SODIUM CHLORIDE 0.9 % IV BOLUS
1000.0000 mL | Freq: Once | INTRAVENOUS | Status: AC
  Administered 2023-05-14: 1000 mL via INTRAVENOUS

## 2023-05-14 MED ORDER — DICYCLOMINE HCL 10 MG PO CAPS: 10.0000 mg | ORAL_CAPSULE | Freq: Three times a day (TID) | ORAL | 0 refills | Status: DC | PRN

## 2023-05-14 MED ORDER — IOHEXOL 300 MG/ML  SOLN
100.0000 mL | Freq: Once | INTRAMUSCULAR | Status: AC | PRN
  Administered 2023-05-14: 100 mL via INTRAVENOUS

## 2023-05-14 MED ORDER — AZITHROMYCIN 500 MG PO TABS: 500.0000 mg | ORAL_TABLET | Freq: Every day | ORAL | 0 refills | Status: DC

## 2023-05-14 MED ORDER — HALOPERIDOL LACTATE 5 MG/ML IJ SOLN
5.0000 mg | Freq: Once | INTRAMUSCULAR | Status: AC
  Administered 2023-05-14: 5 mg via INTRAVENOUS
  Filled 2023-05-14: qty 1

## 2023-05-14 MED ORDER — MORPHINE SULFATE (PF) 4 MG/ML IV SOLN
4.0000 mg | Freq: Once | INTRAVENOUS | Status: AC
  Administered 2023-05-14: 4 mg via INTRAVENOUS
  Filled 2023-05-14: qty 1

## 2023-05-14 MED ORDER — AZITHROMYCIN 500 MG PO TABS
500.0000 mg | ORAL_TABLET | Freq: Once | ORAL | Status: AC
  Administered 2023-05-14: 500 mg via ORAL
  Filled 2023-05-14: qty 1

## 2023-05-14 MED ORDER — ONDANSETRON HCL 4 MG/2ML IJ SOLN
4.0000 mg | Freq: Once | INTRAMUSCULAR | Status: AC
  Administered 2023-05-14: 4 mg via INTRAVENOUS
  Filled 2023-05-14: qty 2

## 2023-05-14 NOTE — ED Provider Notes (Signed)
[facility] Provider Note    Event Date/Time   First MD Initiated Contact with Patient 05/14/23 1806     (approximate)   History   Diarrhea   HPI  Lawrence [name] is a 67 y.o. male who presents to the emergency department today because of concerns for diarrhea.  Diarrhea started this morning.  Has had roughly 20 episodes since then.  Has noticed some blood in it.  This has been accompanied by abdominal pain.  Patient has had similar symptoms in the past which has caused anemia.  Patient denies any fevers.  Denies any unusual ingestions.  No known sick contacts.     Physical Exam   Triage Vital Signs: ED Triage Vitals  Enc Vitals Group     BP 05/14/23 1716 (!) 192/93     Pulse Rate 05/14/23 1716 (!) 59     Resp 05/14/23 1716 17     Temp 05/14/23 1716 97.9 F (36.6 C)     Temp Source 05/14/23 1716 Oral     SpO2 05/14/23 1716 94 %     Weight --      Height --      Head Circumference --      Peak Flow --      Pain Score 05/14/23 1717 9     Pain Loc --      Pain Edu? --      Excl. in GC? --     Most recent vital signs: Vitals:   05/14/23 1812 05/14/23 1820  BP: (!) 169/117 (!) 160/95  Pulse: (!) 57 (!) 56  Resp:  16  Temp: 97.6 F (36.4 C)   SpO2: 98% 98%   General: Awake, alert, oriented. CV:  Good peripheral perfusion. Bradycardia. Resp:  Normal effort. Lungs clear. Abd:  No distention. Tender to palpation diffusely.   ED Results / Procedures / Treatments   Labs (all labs ordered are listed, but only abnormal results are displayed) Labs Reviewed  GASTROINTESTINAL PANEL BY PCR, STOOL (REPLACES STOOL CULTURE) - Abnormal; Notable for the following components:      Result Value   Enteroaggregative E coli (EAEC) DETECTED (*)    All other components within normal limits  COMPREHENSIVE METABOLIC PANEL - Abnormal; Notable for the following components:   Potassium 5.2 (*)    Glucose, Bld 106 (*)    All other components within normal  limits  CBC - Abnormal; Notable for the following components:   RDW 19.0 (*)    All other components within normal limits  URINALYSIS, ROUTINE W REFLEX MICROSCOPIC - Abnormal; Notable for the following components:   Color, Urine STRAW (*)    APPearance CLEAR (*)    Specific Gravity, Urine 1.003 (*)    Glucose, UA >=500 (*)    Bacteria, UA RARE (*)    All other components within normal limits  TROPONIN I (HIGH SENSITIVITY) - Abnormal; Notable for the following components:   Troponin I (High Sensitivity) 37 (*)    All other components within normal limits  C DIFFICILE QUICK SCREEN W PCR REFLEX    LIPASE, BLOOD     EKG  None   RADIOLOGY I independently interpreted and visualized the CT abd/pel. My interpretation: No free air Radiology interpretation:  IMPRESSION:  1. Distal esophageal and gastroesophageal junction wall thickening.  Underlying malignancy or ulceration with inflammation not excluded.  Recommend direct visualization.  2. Colonic diverticulosis with no acute diverticulitis.  3. Tiny left fat containing inguinal  hernia.  4.  Aortic Atherosclerosis (ICD10-I70.0).      PROCEDURES:  Critical Care performed: No   MEDICATIONS ORDERED IN ED: Medications - No data to display   IMPRESSION / MDM / ASSESSMENT AND PLAN / ED COURSE  I reviewed the triage vital signs and the nursing notes.                              Differential diagnosis includes, but is not limited to, gastroenteritis, diverticulitis  Patient's presentation is most consistent with acute presentation with potential threat to life or bodily function.   The patient is on the cardiac monitor to evaluate for evidence of arrhythmia and/or significant heart rate changes.  Patient presented to the emergency department today because of concerns for diarrhea.  On exam patient is neither hypotensive nor tachycardic.  Blood work without any concerning anemia.  Did obtain a CT scan which did not show any  obvious etiology of the patient's diarrhea.  Did question some thickening of the esophageal mucosa.  Patient did undergo upper endoscopy earlier this month.  Patient's GI panel is positive for E. coli.  I discussed this with the patient.  Do feel patient would benefit from course of antibiotics.  Additionally will give patient nausea medication.      FINAL CLINICAL IMPRESSION(S) / ED DIAGNOSES   Final diagnoses:  E coli enteritis     Note:  This document was prepared using Dragon voice recognition software and may include unintentional dictation errors.    Phineas Semen, MD 05/14/23 2351

## 2023-05-14 NOTE — ED Triage Notes (Signed)
Pt presents to ED with c/o of diarrhea, pt states HX of ulcers, pt states recently admitted for same. Pt states possible blood in stool. NAD noted.

## 2023-05-14 NOTE — Telephone Encounter (Signed)
Message from Valora Piccolo sent at 05/14/2023  4:15 PM EDT  Summary: Diarrhea Advice   Pt is calling to report he was discharged from the hospital on 05/04/23. Pt reports nausea with diarrhea (since early this morning)         Chief Complaint: diarrhea and nausea Symptoms: severe diarrhea brownish red in color, dizziness and epigastric pain that is severe and constant Frequency: this morning  Pertinent Negatives: Patient denies fever Disposition: [x] ED /[] Urgent Care (no appt availability in office) / [] Appointment(In office/virtual)/ []  [place] [facility]/ [] Home Care/ [] Refused Recommended Disposition /[] [place] [facility]/ []  Follow-up with PCP Additional Notes:   Reason for Disposition  [1] Blood in the stool AND [2] moderate or large amount of blood  Answer Assessment - Initial Assessment Questions 1. DIARRHEA SEVERITY: "How bad is the diarrhea?" "How many more stools have you had in the past 24 hours than normal?"    - NO DIARRHEA (SCALE 0)   - MILD (SCALE 1-3): Few loose or mushy BMs; increase of 1-3 stools over normal daily number of stools; mild increase in ostomy output.   -  MODERATE (SCALE 4-7): Increase of 4-6 stools daily over normal; moderate increase in ostomy output.   -  SEVERE (SCALE 8-10; OR "WORST POSSIBLE"): Increase of 7 or more stools daily over normal; moderate increase in ostomy output; incontinence.     severe 2. ONSET: "When did the diarrhea begin?"      This am  3. BM CONSISTENCY: "How loose or watery is the diarrhea?"      Runny  4. VOMITING: "Are you also vomiting?" If Yes, ask: "How many times in the past 24 hours?"      no 5. ABDOMEN PAIN: "Are you having any abdomen pain?" If Yes, ask: "What does it feel like?" (e.g., crampy, dull, intermittent, constant)      Yes- pressure  6. ABDOMEN PAIN SEVERITY: If present, ask: "How bad is the pain?"  (e.g., Scale 1-10; mild, moderate, or severe)   - MILD (1-3): doesn't interfere with normal  activities, abdomen soft and not tender to touch    - MODERATE (4-7): interferes with normal activities or awakens from sleep, abdomen tender to touch    - SEVERE (8-10): excruciating pain, doubled over, unable to do any normal activities       Severe- 7. ORAL INTAKE: If vomiting, "Have you been able to drink liquids?" "How much liquids have you had in the past 24 hours?"     N/a 8. HYDRATION: "Any signs of dehydration?" (e.g., dry mouth [not just dry lips], too weak to stand, dizziness, new weight loss) "When did you last urinate?"     Dizziness 15 minutes before call decreased mount and dark in color    11. OTHER SYMPTOMS: "Do you have any other symptoms?" (e.g., fever, blood in stool)       Brownish red in color, dizziness, pain to bottom of ribs at the epigastric area  Protocols used: [facility]

## 2023-05-14 NOTE — ED Notes (Signed)
GI panel +E.coli EAEC  Bryan from lab to inform of lab

## 2023-05-15 DIAGNOSIS — R1084 Generalized abdominal pain: Secondary | ICD-10-CM | POA: Diagnosis not present

## 2023-05-15 DIAGNOSIS — Z743 Need for continuous supervision: Secondary | ICD-10-CM | POA: Diagnosis not present

## 2023-05-15 NOTE — ED Notes (Signed)
EMS transport requested upon patient's d/c due to oxygen dependence and patient having no portable oxygen concentrator at this time

## 2023-05-16 ENCOUNTER — Emergency Department: Payer: Medicare HMO

## 2023-05-16 ENCOUNTER — Inpatient Hospital Stay
Admission: EM | Admit: 2023-05-16 | Discharge: 2023-05-18 | DRG: 280 | Disposition: A | Discharge status: Home Health | Payer: Medicare HMO | Attending: Osteopathic Medicine | Admitting: Osteopathic Medicine

## 2023-05-16 DIAGNOSIS — J4489 Other specified chronic obstructive pulmonary disease: Secondary | ICD-10-CM | POA: Diagnosis present

## 2023-05-16 DIAGNOSIS — R0789 Other chest pain: Principal | ICD-10-CM

## 2023-05-16 DIAGNOSIS — I25709 Atherosclerosis of coronary artery bypass graft(s), unspecified, with unspecified angina pectoris: Principal | ICD-10-CM | POA: Diagnosis present

## 2023-05-16 DIAGNOSIS — I5033 Acute on chronic diastolic (congestive) heart failure: Secondary | ICD-10-CM | POA: Diagnosis present

## 2023-05-16 DIAGNOSIS — Z818 Family history of other mental and behavioral disorders: Secondary | ICD-10-CM

## 2023-05-16 DIAGNOSIS — Z7901 Long term (current) use of anticoagulants: Secondary | ICD-10-CM

## 2023-05-16 DIAGNOSIS — I110 Hypertensive heart disease with heart failure: Secondary | ICD-10-CM | POA: Diagnosis present

## 2023-05-16 DIAGNOSIS — Z888 Allergy status to other drugs, medicaments and biological substances status: Secondary | ICD-10-CM

## 2023-05-16 DIAGNOSIS — E119 Type 2 diabetes mellitus without complications: Secondary | ICD-10-CM

## 2023-05-16 DIAGNOSIS — Z882 Allergy status to sulfonamides status: Secondary | ICD-10-CM

## 2023-05-16 DIAGNOSIS — I10 Essential (primary) hypertension: Secondary | ICD-10-CM | POA: Diagnosis not present

## 2023-05-16 DIAGNOSIS — Z85828 Personal history of other malignant neoplasm of skin: Secondary | ICD-10-CM

## 2023-05-16 DIAGNOSIS — I340 Nonrheumatic mitral (valve) insufficiency: Secondary | ICD-10-CM | POA: Diagnosis present

## 2023-05-16 DIAGNOSIS — E782 Mixed hyperlipidemia: Secondary | ICD-10-CM | POA: Insufficient documentation

## 2023-05-16 DIAGNOSIS — R079 Chest pain, unspecified: Secondary | ICD-10-CM | POA: Diagnosis present

## 2023-05-16 DIAGNOSIS — K219 Gastro-esophageal reflux disease without esophagitis: Secondary | ICD-10-CM | POA: Diagnosis present

## 2023-05-16 DIAGNOSIS — A044 Other intestinal Escherichia coli infections: Secondary | ICD-10-CM | POA: Diagnosis present

## 2023-05-16 DIAGNOSIS — Z7902 Long term (current) use of antithrombotics/antiplatelets: Secondary | ICD-10-CM

## 2023-05-16 DIAGNOSIS — Z7984 Long term (current) use of oral hypoglycemic drugs: Secondary | ICD-10-CM

## 2023-05-16 DIAGNOSIS — Z87891 Personal history of nicotine dependence: Secondary | ICD-10-CM

## 2023-05-16 DIAGNOSIS — I2510 Atherosclerotic heart disease of native coronary artery without angina pectoris: Secondary | ICD-10-CM | POA: Diagnosis present

## 2023-05-16 DIAGNOSIS — I214 Non-ST elevation (NSTEMI) myocardial infarction: Secondary | ICD-10-CM | POA: Diagnosis present

## 2023-05-16 DIAGNOSIS — F419 Anxiety disorder, unspecified: Secondary | ICD-10-CM | POA: Diagnosis present

## 2023-05-16 DIAGNOSIS — Z955 Presence of coronary angioplasty implant and graft: Secondary | ICD-10-CM

## 2023-05-16 DIAGNOSIS — R001 Bradycardia, unspecified: Secondary | ICD-10-CM | POA: Diagnosis present

## 2023-05-16 DIAGNOSIS — Z825 Family history of asthma and other chronic lower respiratory diseases: Secondary | ICD-10-CM

## 2023-05-16 DIAGNOSIS — Z555 Less than a high school diploma: Secondary | ICD-10-CM

## 2023-05-16 DIAGNOSIS — Z833 Family history of diabetes mellitus: Secondary | ICD-10-CM

## 2023-05-16 DIAGNOSIS — Z951 Presence of aortocoronary bypass graft: Secondary | ICD-10-CM

## 2023-05-16 DIAGNOSIS — I2489 Other forms of acute ischemic heart disease: Secondary | ICD-10-CM | POA: Diagnosis present

## 2023-05-16 DIAGNOSIS — Z66 Do not resuscitate: Secondary | ICD-10-CM | POA: Diagnosis present

## 2023-05-16 DIAGNOSIS — Z86010 Personal history of colonic polyps: Secondary | ICD-10-CM

## 2023-05-16 DIAGNOSIS — K900 Celiac disease: Secondary | ICD-10-CM | POA: Diagnosis present

## 2023-05-16 DIAGNOSIS — Z8249 Family history of ischemic heart disease and other diseases of the circulatory system: Secondary | ICD-10-CM

## 2023-05-16 DIAGNOSIS — Z9049 Acquired absence of other specified parts of digestive tract: Secondary | ICD-10-CM

## 2023-05-16 DIAGNOSIS — Z87442 Personal history of urinary calculi: Secondary | ICD-10-CM

## 2023-05-16 DIAGNOSIS — G4733 Obstructive sleep apnea (adult) (pediatric): Secondary | ICD-10-CM | POA: Diagnosis present

## 2023-05-16 DIAGNOSIS — Z820 Family history of epilepsy and other diseases of the nervous system: Secondary | ICD-10-CM

## 2023-05-16 DIAGNOSIS — Z79899 Other long term (current) drug therapy: Secondary | ICD-10-CM

## 2023-05-16 DIAGNOSIS — Z885 Allergy status to narcotic agent status: Secondary | ICD-10-CM

## 2023-05-16 DIAGNOSIS — G8929 Other chronic pain: Secondary | ICD-10-CM | POA: Diagnosis present

## 2023-05-16 DIAGNOSIS — I480 Paroxysmal atrial fibrillation: Secondary | ICD-10-CM | POA: Diagnosis present

## 2023-05-16 LAB — CBC WITH DIFFERENTIAL/PLATELET
Abs Immature Granulocytes: 0.04 10*3/uL (ref 0.00–0.07)
Basophils Absolute: 0 10*3/uL (ref 0.0–0.1)
Basophils Relative: 0 %
Eosinophils Absolute: 0.1 10*3/uL (ref 0.0–0.5)
Eosinophils Relative: 2 %
HCT: 40.4 % (ref 39.0–52.0)
Hemoglobin: 12.6 g/dL — ABNORMAL LOW (ref 13.0–17.0)
Immature Granulocytes: 1 %
Lymphocytes Relative: 14 %
Lymphs Abs: 1 10*3/uL (ref 0.7–4.0)
MCH: 27.7 pg (ref 26.0–34.0)
MCHC: 31.2 g/dL (ref 30.0–36.0)
MCV: 88.8 fL (ref 80.0–100.0)
Monocytes Absolute: 0.5 10*3/uL (ref 0.1–1.0)
Monocytes Relative: 7 %
Neutro Abs: 5.5 10*3/uL (ref 1.7–7.7)
Neutrophils Relative %: 76 %
Platelets: 164 10*3/uL (ref 150–400)
RBC: 4.55 MIL/uL (ref 4.22–5.81)
RDW: 19.2 % — ABNORMAL HIGH (ref 11.5–15.5)
WBC: 7.1 10*3/uL (ref 4.0–10.5)
nRBC: 0 % (ref 0.0–0.2)

## 2023-05-16 MED ORDER — IPRATROPIUM-ALBUTEROL 0.5-2.5 (3) MG/3ML IN SOLN
3.0000 mL | Freq: Once | RESPIRATORY_TRACT | Status: AC
  Administered 2023-05-16: 3 mL via RESPIRATORY_TRACT
  Filled 2023-05-16: qty 3

## 2023-05-16 MED ORDER — PREDNISONE 20 MG PO TABS
60.0000 mg | ORAL_TABLET | Freq: Once | ORAL | Status: AC
  Administered 2023-05-16: 60 mg via ORAL
  Filled 2023-05-16: qty 3

## 2023-05-16 NOTE — ED Provider Notes (Signed)
[facility] Provider Note    Event Date/Time   First MD Initiated Contact with Patient 05/16/23 2348     (approximate)   History   Chest Pain (Chest pain L side x 2 hrs. Hx of COPD, states he felt he needed to call EMS for help)   HPI  Lawrence [name] is a 67 y.o. male who presents to the ED for evaluation of Chest Pain (Chest pain L side x 2 hrs. Hx of COPD, states he felt he needed to call EMS for help)   I reviewed ED visit from 2 days ago where patient was evaluated for diarrhea, GI panel with evidence of E. coli, discharged with azithromycin, Bentyl and Zofran. 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  Quit smoking 10 years ago.  COPD and CHF.  A-fib.  Anticoagulated on Eliquis.  Patient presents for evaluation of a couple hours of left-sided chest pain.  Reports his breathing feels "off" alongside this same timeframe.  No cough, syncope, abdominal pain, emesis, fever   Physical Exam   Triage Vital Signs: ED Triage Vitals [05/16/23 2350]  Enc Vitals Group     BP (!) 179/87     Pulse Rate (!) 57     Resp 16     Temp 97.8 F (36.6 C)     Temp Source Oral     SpO2 97 %     Weight      Height      Head Circumference      Peak Flow      Pain Score      Pain Loc      Pain Edu?      Excl. in GC?     Most recent vital signs: Vitals:   05/17/23 0330 05/17/23 0333  BP: (!) 175/98   Pulse: (!) 55   Resp: 20   Temp:  97.8 F (36.6 C)  SpO2: 99%     General: Awake, no distress.  Pleasant and conversational, on chronic 2 L CV:  Good peripheral perfusion.  Resp:  Normal effort.  Mild diffuse expiratory wheezes, good airflow throughout Abd:  No distention.  Soft MSK:  No deformity noted.  No significant swelling to the lower extremities Neuro:  No focal deficits appreciated. Other:     ED Results / Procedures / Treatments   Labs (all labs ordered are listed, but only abnormal results  are displayed) Labs Reviewed  CBC WITH DIFFERENTIAL/PLATELET - Abnormal; Notable for the following components:      Result Value   Hemoglobin 12.6 (*)    RDW 19.2 (*)    All other components within normal limits  BRAIN NATRIURETIC PEPTIDE - Abnormal; Notable for the following components:   B Natriuretic Peptide 826.8 (*)    All other components within normal limits  COMPREHENSIVE METABOLIC PANEL - Abnormal; Notable for the following components:   Glucose, Bld 119 (*)    All other components within normal limits  TROPONIN I (HIGH SENSITIVITY) - Abnormal; Notable for the following components:   Troponin I (High Sensitivity) 34 (*)    All other components within normal limits  TROPONIN I (HIGH SENSITIVITY) - Abnormal; Notable for the following components:   Troponin I (High Sensitivity) 31 (*)    All other components within normal limits  TROPONIN I (HIGH SENSITIVITY) - Abnormal; Notable for the following components:   Troponin I (High Sensitivity) 30 (*)  All other components within normal limits  MAGNESIUM  BASIC METABOLIC PANEL  CBC    EKG Sinus rhythm with a rate of 58 bpm.  Normal axis and intervals.  No clear signs of acute ischemia.  RADIOLOGY 2 view CXR interpreted by me with mild pulmonary vascular congestion  Official radiology report(s): DG Chest 2 View  Result Date: 05/17/2023 CLINICAL DATA:  Chest pain and shortness of breath. 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 There is a stable mediastinum. There is aortic atherosclerosis and slight tortuosity. There is mild central vascular prominence with improvement. Mild interstitial edema is noted in the bases and minimal pleural effusions but both findings were greater previously. The lungs are clear of focal infiltrates. Osteopenia and degenerative change thoracic spine. Mild thoracic kyphodextroscoliosis.  IMPRESSION: 1. Mild interstitial edema and minimal pleural effusions. Findings are improved from 04/27/2023. 2. 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 3. Aortic atherosclerosis. 4. No other acute chest findings. Electronically Signed   By: Almira Bar M.D.   On: 05/17/2023 01:02    PROCEDURES and INTERVENTIONS:  .1-3 Lead EKG Interpretation  Performed by: Delton Prairie, MD Authorized by: Delton Prairie, MD     Interpretation: normal     ECG rate:  60   ECG rate assessment: normal     Rhythm: sinus rhythm     Ectopy: none     Conduction: normal     Medications  lidocaine (LIDODERM) 5 % 1 patch (1 patch Transdermal Patch Applied 05/17/23 0109)  allopurinol (ZYLOPRIM) tablet 300 mg (has no administration in time range)  oxyCODONE-acetaminophen (PERCOCET) 10-325 MG per tablet 1 tablet (has no administration in time range)  azithromycin (ZITHROMAX) tablet 500 mg (has no administration in time range)  amiodarone (PACERONE) tablet 200 mg (has no administration in time range)  atorvastatin (LIPITOR) tablet 80 mg (has no administration in time range)  isosorbide mononitrate (IMDUR) 24 hr tablet 120 mg (has no administration in time range)  lisinopril (ZESTRIL) tablet 20 mg (has no administration in time range)  metoprolol succinate (TOPROL-XL) 24 hr tablet 50 mg (has no administration in time range)  nitroGLYCERIN (NITROSTAT) SL tablet 0.4 mg (has no administration in time range)  omega-3 acid ethyl esters (LOVAZA) capsule 4 g (has no administration in time range)  ranolazine (RANEXA) 12 hr tablet 1,000 mg (has no administration in time range)  torsemide (DEMADEX) tablet 50 mg (has no administration in time range)  ALPRAZolam (XANAX) tablet 1 mg (has no administration in time range)  traZODone (DESYREL) tablet 150 mg (has no administration in time range)  venlafaxine XR (EFFEXOR-XR) 24 hr capsule 75 mg (has no administration in time range)  glipiZIDE (GLUCOTROL) tablet 5 mg (has no  administration in time range)  dicyclomine (BENTYL) capsule 10 mg (has no administration in time range)  meclizine (ANTIVERT) tablet 25 mg (has no administration in time range)  pantoprazole (PROTONIX) EC tablet 40 mg (has no administration in time range)  ondansetron (ZOFRAN) tablet 4 mg (has no administration in time range)  pantoprazole (PROTONIX) EC tablet 40 mg (has no administration in time range)  clopidogrel (PLAVIX) tablet 75 mg (has no administration in time range)  apixaban (ELIQUIS) tablet 5 mg (has no administration in time range)  iron polysaccharides (NIFEREX) capsule 150 mg (has no administration in time range)  methocarbamol (ROBAXIN) tablet 500 mg (has no administration in time range)  ipratropium-albuterol (DUONEB) 0.5-2.5 (3) MG/3ML nebulizer solution 3 mL (has no administration in time range)  furosemide (LASIX) injection 40 mg (has no administration in time range)  acetaminophen (TYLENOL) tablet 650 mg (has no administration in time range)    Or  acetaminophen (TYLENOL) suppository 650 mg (has no administration in time range)  traZODone (DESYREL) tablet 25 mg (has no administration in time range)  magnesium hydroxide (MILK OF MAGNESIA) suspension 30 mL (has no administration in time range)  fentaNYL (SUBLIMAZE) injection 25 mcg (has no administration in time range)  guaiFENesin (MUCINEX) 12 hr tablet 600 mg (has no administration in time range)  methylPREDNISolone sodium succinate (SOLU-MEDROL) 40 mg/mL injection 40 mg (has no administration in time range)  ipratropium-albuterol (DUONEB) 0.5-2.5 (3) MG/3ML nebulizer solution 3 mL (3 mLs Nebulization Given 05/16/23 2355)  predniSONE (DELTASONE) tablet 60 mg (60 mg Oral Given 05/16/23 2355)  fentaNYL (SUBLIMAZE) injection 50 mcg (50 mcg Intravenous Given 05/17/23 0109)  furosemide (LASIX) injection 40 mg (40 mg Intravenous Given 05/17/23 0209)  fentaNYL (SUBLIMAZE) injection 50 mcg (50 mcg Intravenous Given 05/17/23 0331)      IMPRESSION / MDM / ASSESSMENT AND PLAN / ED COURSE  I reviewed the triage vital signs and the nursing notes.  Differential diagnosis includes, but is not limited to, ACS, PTX, PNA, muscle strain/spasm, PE, dissection, anxiety, pleural effusion  {Patient presents with symptoms of an acute illness or injury that is potentially life-threatening.  Patient presents with persistent atypical chest discomfort in the setting of high risk nature and signs of mild CHF and COPD exacerbation, requiring medical admission.  EKG without STEMI, 3 flat troponins.  CXR congested and BNP is elevated.  Provide IV Lasix.  No hypoxia.  Normal electrolytes and CBC.  Due to his risk alongside persistent symptoms we will consult medicine for admission  Clinical Course as of 05/17/23 0457  [facility] May 17, 2023  0204 reassessed [DS]  0457 Reassessed.  Still with chest discomfort.  We will admit for observation [DS]    Clinical Course User Index [DS] Delton Prairie, MD     FINAL CLINICAL IMPRESSION(S) / ED DIAGNOSES   Final diagnoses:  Other chest pain  Acute on chronic diastolic CHF (congestive heart failure) (HCC)     Rx / DC Orders   ED Discharge Orders     None        Note:  This document was prepared using Dragon voice recognition software and may include unintentional dictation errors.   Delton Prairie, MD 05/17/23 [phone]

## 2023-05-17 ENCOUNTER — Ambulatory Visit (INDEPENDENT_AMBULATORY_CARE_PROVIDER_SITE_OTHER): Payer: Medicare HMO | Admitting: Family Medicine

## 2023-05-17 ENCOUNTER — Encounter: Payer: Self-pay | Admitting: Family Medicine

## 2023-05-17 ENCOUNTER — Other Ambulatory Visit: Payer: Self-pay

## 2023-05-17 ENCOUNTER — Other Ambulatory Visit: Payer: Self-pay | Admitting: Family Medicine

## 2023-05-17 DIAGNOSIS — E1121 Type 2 diabetes mellitus with diabetic nephropathy: Secondary | ICD-10-CM

## 2023-05-17 DIAGNOSIS — J449 Chronic obstructive pulmonary disease, unspecified: Secondary | ICD-10-CM

## 2023-05-17 DIAGNOSIS — I480 Paroxysmal atrial fibrillation: Secondary | ICD-10-CM | POA: Diagnosis present

## 2023-05-17 DIAGNOSIS — J9611 Chronic respiratory failure with hypoxia: Secondary | ICD-10-CM | POA: Diagnosis not present

## 2023-05-17 DIAGNOSIS — J4489 Other specified chronic obstructive pulmonary disease: Secondary | ICD-10-CM | POA: Diagnosis present

## 2023-05-17 DIAGNOSIS — R001 Bradycardia, unspecified: Secondary | ICD-10-CM | POA: Diagnosis present

## 2023-05-17 DIAGNOSIS — E119 Type 2 diabetes mellitus without complications: Secondary | ICD-10-CM | POA: Diagnosis present

## 2023-05-17 DIAGNOSIS — Z955 Presence of coronary angioplasty implant and graft: Secondary | ICD-10-CM | POA: Diagnosis not present

## 2023-05-17 DIAGNOSIS — N182 Chronic kidney disease, stage 2 (mild): Secondary | ICD-10-CM

## 2023-05-17 DIAGNOSIS — K219 Gastro-esophageal reflux disease without esophagitis: Secondary | ICD-10-CM

## 2023-05-17 DIAGNOSIS — Z87442 Personal history of urinary calculi: Secondary | ICD-10-CM | POA: Diagnosis not present

## 2023-05-17 DIAGNOSIS — I5023 Acute on chronic systolic (congestive) heart failure: Secondary | ICD-10-CM | POA: Diagnosis not present

## 2023-05-17 DIAGNOSIS — I110 Hypertensive heart disease with heart failure: Secondary | ICD-10-CM | POA: Diagnosis present

## 2023-05-17 DIAGNOSIS — R0789 Other chest pain: Secondary | ICD-10-CM | POA: Diagnosis present

## 2023-05-17 DIAGNOSIS — I25709 Atherosclerosis of coronary artery bypass graft(s), unspecified, with unspecified angina pectoris: Secondary | ICD-10-CM | POA: Diagnosis present

## 2023-05-17 DIAGNOSIS — I10 Essential (primary) hypertension: Secondary | ICD-10-CM | POA: Diagnosis not present

## 2023-05-17 DIAGNOSIS — Z7901 Long term (current) use of anticoagulants: Secondary | ICD-10-CM | POA: Diagnosis not present

## 2023-05-17 DIAGNOSIS — E782 Mixed hyperlipidemia: Secondary | ICD-10-CM | POA: Insufficient documentation

## 2023-05-17 DIAGNOSIS — Z79899 Other long term (current) drug therapy: Secondary | ICD-10-CM | POA: Diagnosis not present

## 2023-05-17 DIAGNOSIS — Z7984 Long term (current) use of oral hypoglycemic drugs: Secondary | ICD-10-CM | POA: Diagnosis not present

## 2023-05-17 DIAGNOSIS — Z8249 Family history of ischemic heart disease and other diseases of the circulatory system: Secondary | ICD-10-CM | POA: Diagnosis not present

## 2023-05-17 DIAGNOSIS — I25118 Atherosclerotic heart disease of native coronary artery with other forms of angina pectoris: Secondary | ICD-10-CM

## 2023-05-17 DIAGNOSIS — R079 Chest pain, unspecified: Secondary | ICD-10-CM

## 2023-05-17 DIAGNOSIS — J811 Chronic pulmonary edema: Secondary | ICD-10-CM | POA: Diagnosis not present

## 2023-05-17 DIAGNOSIS — J90 Pleural effusion, not elsewhere classified: Secondary | ICD-10-CM | POA: Diagnosis not present

## 2023-05-17 DIAGNOSIS — I2489 Other forms of acute ischemic heart disease: Secondary | ICD-10-CM | POA: Diagnosis present

## 2023-05-17 DIAGNOSIS — Z87891 Personal history of nicotine dependence: Secondary | ICD-10-CM | POA: Diagnosis not present

## 2023-05-17 DIAGNOSIS — Z951 Presence of aortocoronary bypass graft: Secondary | ICD-10-CM | POA: Diagnosis not present

## 2023-05-17 DIAGNOSIS — R0602 Shortness of breath: Secondary | ICD-10-CM | POA: Diagnosis not present

## 2023-05-17 DIAGNOSIS — I214 Non-ST elevation (NSTEMI) myocardial infarction: Secondary | ICD-10-CM | POA: Diagnosis present

## 2023-05-17 DIAGNOSIS — I340 Nonrheumatic mitral (valve) insufficiency: Secondary | ICD-10-CM | POA: Diagnosis present

## 2023-05-17 DIAGNOSIS — I4891 Unspecified atrial fibrillation: Secondary | ICD-10-CM | POA: Diagnosis not present

## 2023-05-17 DIAGNOSIS — Z85828 Personal history of other malignant neoplasm of skin: Secondary | ICD-10-CM | POA: Diagnosis not present

## 2023-05-17 DIAGNOSIS — I5033 Acute on chronic diastolic (congestive) heart failure: Secondary | ICD-10-CM | POA: Diagnosis present

## 2023-05-17 DIAGNOSIS — G8929 Other chronic pain: Secondary | ICD-10-CM | POA: Diagnosis present

## 2023-05-17 DIAGNOSIS — A044 Other intestinal Escherichia coli infections: Secondary | ICD-10-CM | POA: Diagnosis present

## 2023-05-17 DIAGNOSIS — Z66 Do not resuscitate: Secondary | ICD-10-CM | POA: Diagnosis present

## 2023-05-17 LAB — COMPREHENSIVE METABOLIC PANEL
ALT: 13 U/L (ref 0–44)
AST: 15 U/L (ref 15–41)
Albumin: 3.5 g/dL (ref 3.5–5.0)
Alkaline Phosphatase: 51 U/L (ref 38–126)
Anion gap: 7 (ref 5–15)
BUN: 13 mg/dL (ref 8–23)
CO2: 25 mmol/L (ref 22–32)
Calcium: 9 mg/dL (ref 8.9–10.3)
Chloride: 107 mmol/L (ref 98–111)
Creatinine, Ser: 0.83 mg/dL (ref 0.61–1.24)
GFR, Estimated: >60 mL/min (ref >60)
Glucose, Bld: 119 mg/dL — ABNORMAL HIGH (ref 70–99)
Potassium: 4 mmol/L (ref 3.5–5.1)
Sodium: 139 mmol/L (ref 135–145)
Total Bilirubin: 1 mg/dL (ref 0.3–1.2)
Total Protein: 6.6 g/dL (ref 6.5–8.1)

## 2023-05-17 LAB — CBC
HCT: 43 % (ref 39.0–52.0)
Hemoglobin: 13.4 g/dL (ref 13.0–17.0)
MCH: 27.9 pg (ref 26.0–34.0)
MCHC: 31.2 g/dL (ref 30.0–36.0)
MCV: 89.6 fL (ref 80.0–100.0)
Platelets: 165 10*3/uL (ref 150–400)
RBC: 4.8 MIL/uL (ref 4.22–5.81)
RDW: 19.3 % — ABNORMAL HIGH (ref 11.5–15.5)
WBC: 6.1 10*3/uL (ref 4.0–10.5)
nRBC: 0 % (ref 0.0–0.2)

## 2023-05-17 LAB — BASIC METABOLIC PANEL
Anion gap: 9 (ref 5–15)
BUN: 17 mg/dL (ref 8–23)
CO2: 26 mmol/L (ref 22–32)
Calcium: 9.1 mg/dL (ref 8.9–10.3)
Chloride: 102 mmol/L (ref 98–111)
Creatinine, Ser: 0.8 mg/dL (ref 0.61–1.24)
GFR, Estimated: >60 mL/min (ref >60)
Glucose, Bld: 174 mg/dL — ABNORMAL HIGH (ref 70–99)
Potassium: 4.3 mmol/L (ref 3.5–5.1)
Sodium: 137 mmol/L (ref 135–145)

## 2023-05-17 LAB — TROPONIN I (HIGH SENSITIVITY)
Troponin I (High Sensitivity): 30 ng/L — ABNORMAL HIGH (ref <18)
Troponin I (High Sensitivity): 31 ng/L — ABNORMAL HIGH (ref <18)
Troponin I (High Sensitivity): 34 ng/L — ABNORMAL HIGH (ref <18)

## 2023-05-17 LAB — BRAIN NATRIURETIC PEPTIDE: B Natriuretic Peptide: 826.8 pg/mL — ABNORMAL HIGH (ref 0.0–100.0)

## 2023-05-17 LAB — MAGNESIUM: Magnesium: 2 mg/dL (ref 1.7–2.4)

## 2023-05-17 LAB — CBG MONITORING, ED
Glucose-Capillary: 225 mg/dL — ABNORMAL HIGH (ref 70–99)
Glucose-Capillary: 205 mg/dL — ABNORMAL HIGH (ref 70–99)

## 2023-05-17 MED ORDER — OMEGA-3-ACID ETHYL ESTERS 1 G PO CAPS
4.0000 | ORAL_CAPSULE | Freq: Every day | ORAL | Status: DC
  Administered 2023-05-17 – 2023-05-18 (×2): 4 g via ORAL
  Filled 2023-05-17 (×2): qty 4

## 2023-05-17 MED ORDER — ISOSORBIDE MONONITRATE ER 60 MG PO TB24
120.0000 mg | ORAL_TABLET | Freq: Every day | ORAL | Status: DC
  Administered 2023-05-17 – 2023-05-18 (×2): 120 mg via ORAL
  Filled 2023-05-17 (×2): qty 2

## 2023-05-17 MED ORDER — ACETAMINOPHEN 650 MG RE SUPP: 650.0000 mg | Freq: Four times a day (QID) | RECTAL | Status: DC | PRN

## 2023-05-17 MED ORDER — POLYSACCHARIDE IRON COMPLEX 150 MG PO CAPS
150.0000 mg | ORAL_CAPSULE | Freq: Every day | ORAL | Status: DC
  Administered 2023-05-17 – 2023-05-18 (×2): 150 mg via ORAL
  Filled 2023-05-17 (×2): qty 1

## 2023-05-17 MED ORDER — PANTOPRAZOLE SODIUM 40 MG PO TBEC
40.0000 mg | DELAYED_RELEASE_TABLET | Freq: Every day | ORAL | Status: DC
  Administered 2023-05-17 – 2023-05-18 (×2): 40 mg via ORAL
  Filled 2023-05-17 (×2): qty 1

## 2023-05-17 MED ORDER — ALPRAZOLAM 0.5 MG PO TABS
1.0000 mg | ORAL_TABLET | Freq: Three times a day (TID) | ORAL | Status: DC | PRN
  Administered 2023-05-17 (×2): 1 mg via ORAL
  Filled 2023-05-17 (×2): qty 2

## 2023-05-17 MED ORDER — LIDOCAINE 5 % EX PTCH
1.0000 | MEDICATED_PATCH | CUTANEOUS | Status: DC
  Administered 2023-05-17 – 2023-05-18 (×2): 1 via TRANSDERMAL
  Filled 2023-05-17 (×2): qty 1

## 2023-05-17 MED ORDER — MAGNESIUM HYDROXIDE 400 MG/5ML PO SUSP: 30.0000 mL | Freq: Every day | ORAL | Status: DC | PRN

## 2023-05-17 MED ORDER — LORATADINE 10 MG PO TABS
10.0000 mg | ORAL_TABLET | Freq: Every day | ORAL | Status: DC
  Administered 2023-05-17 – 2023-05-18 (×2): 10 mg via ORAL
  Filled 2023-05-17 (×2): qty 1

## 2023-05-17 MED ORDER — FENTANYL CITRATE PF 50 MCG/ML IJ SOSY
50.0000 ug | PREFILLED_SYRINGE | Freq: Once | INTRAMUSCULAR | Status: AC
  Administered 2023-05-17: 50 ug via INTRAVENOUS
  Filled 2023-05-17: qty 1

## 2023-05-17 MED ORDER — METHOCARBAMOL 500 MG PO TABS: 500.0000 mg | ORAL_TABLET | Freq: Three times a day (TID) | ORAL | Status: DC | PRN

## 2023-05-17 MED ORDER — VENLAFAXINE HCL ER 75 MG PO CP24
75.0000 mg | ORAL_CAPSULE | Freq: Every day | ORAL | Status: DC
  Administered 2023-05-17 – 2023-05-18 (×2): 75 mg via ORAL
  Filled 2023-05-17 (×3): qty 1

## 2023-05-17 MED ORDER — CLOPIDOGREL BISULFATE 75 MG PO TABS
75.0000 mg | ORAL_TABLET | Freq: Every day | ORAL | Status: DC
  Administered 2023-05-17 – 2023-05-18 (×2): 75 mg via ORAL
  Filled 2023-05-17 (×2): qty 1

## 2023-05-17 MED ORDER — AMIODARONE HCL 200 MG PO TABS
200.0000 mg | ORAL_TABLET | Freq: Every day | ORAL | Status: DC
  Administered 2023-05-17 – 2023-05-18 (×2): 200 mg via ORAL
  Filled 2023-05-17 (×2): qty 1

## 2023-05-17 MED ORDER — TORSEMIDE 20 MG PO TABS: 50.0000 mg | ORAL_TABLET | Freq: Every day | ORAL | Status: DC

## 2023-05-17 MED ORDER — FENTANYL CITRATE PF 50 MCG/ML IJ SOSY
25.0000 ug | PREFILLED_SYRINGE | INTRAMUSCULAR | Status: DC | PRN
  Administered 2023-05-17 – 2023-05-18 (×3): 25 ug via INTRAVENOUS
  Filled 2023-05-17 (×4): qty 1

## 2023-05-17 MED ORDER — ACETAMINOPHEN 325 MG PO TABS
650.0000 mg | ORAL_TABLET | Freq: Four times a day (QID) | ORAL | Status: DC | PRN
  Administered 2023-05-17 (×2): 650 mg via ORAL
  Filled 2023-05-17 (×2): qty 2

## 2023-05-17 MED ORDER — UMECLIDINIUM BROMIDE 62.5 MCG/ACT IN AEPB
1.0000 | INHALATION_SPRAY | Freq: Every day | RESPIRATORY_TRACT | Status: DC
  Administered 2023-05-18: 1 via RESPIRATORY_TRACT
  Filled 2023-05-17 (×2): qty 7

## 2023-05-17 MED ORDER — TRAZODONE HCL 50 MG PO TABS
150.0000 mg | ORAL_TABLET | Freq: Every day | ORAL | Status: DC
  Administered 2023-05-17: 150 mg via ORAL
  Filled 2023-05-17: qty 1

## 2023-05-17 MED ORDER — MOMETASONE FURO-FORMOTEROL FUM 100-5 MCG/ACT IN AERO
2.0000 | INHALATION_SPRAY | Freq: Two times a day (BID) | RESPIRATORY_TRACT | Status: DC
  Administered 2023-05-18: 2 via RESPIRATORY_TRACT
  Filled 2023-05-17 (×2): qty 8.8

## 2023-05-17 MED ORDER — AZITHROMYCIN 500 MG PO TABS
500.0000 mg | ORAL_TABLET | Freq: Every day | ORAL | Status: AC
  Administered 2023-05-17: 500 mg via ORAL
  Filled 2023-05-17: qty 1

## 2023-05-17 MED ORDER — MECLIZINE HCL 25 MG PO TABS: 25.0000 mg | ORAL_TABLET | Freq: Three times a day (TID) | ORAL | Status: DC | PRN

## 2023-05-17 MED ORDER — BUDESON-GLYCOPYRROL-FORMOTEROL 160-9-4.8 MCG/ACT IN AERO: 2.0000 | INHALATION_SPRAY | Freq: Two times a day (BID) | RESPIRATORY_TRACT | Status: DC

## 2023-05-17 MED ORDER — ALLOPURINOL 100 MG PO TABS
300.0000 mg | ORAL_TABLET | Freq: Two times a day (BID) | ORAL | Status: DC
  Administered 2023-05-17 – 2023-05-18 (×3): 300 mg via ORAL
  Filled 2023-05-17: qty 3
  Filled 2023-05-17 (×2): qty 1

## 2023-05-17 MED ORDER — ATORVASTATIN CALCIUM 80 MG PO TABS
80.0000 mg | ORAL_TABLET | Freq: Every day | ORAL | Status: DC
  Administered 2023-05-17 – 2023-05-18 (×2): 80 mg via ORAL
  Filled 2023-05-17: qty 4
  Filled 2023-05-17: qty 1

## 2023-05-17 MED ORDER — IPRATROPIUM-ALBUTEROL 0.5-2.5 (3) MG/3ML IN SOLN: 3.0000 mL | RESPIRATORY_TRACT | Status: DC | PRN

## 2023-05-17 MED ORDER — NITROGLYCERIN 0.4 MG SL SUBL: 0.4000 mg | SUBLINGUAL_TABLET | SUBLINGUAL | Status: DC | PRN

## 2023-05-17 MED ORDER — APIXABAN 5 MG PO TABS
5.0000 mg | ORAL_TABLET | Freq: Two times a day (BID) | ORAL | Status: DC
  Administered 2023-05-17 – 2023-05-18 (×3): 5 mg via ORAL
  Filled 2023-05-17 (×3): qty 1

## 2023-05-17 MED ORDER — METOPROLOL SUCCINATE ER 50 MG PO TB24
50.0000 mg | ORAL_TABLET | Freq: Every day | ORAL | Status: DC
  Administered 2023-05-17: 50 mg via ORAL
  Filled 2023-05-17 (×2): qty 1

## 2023-05-17 MED ORDER — IPRATROPIUM-ALBUTEROL 0.5-2.5 (3) MG/3ML IN SOLN
3.0000 mL | Freq: Four times a day (QID) | RESPIRATORY_TRACT | Status: DC
  Administered 2023-05-17 – 2023-05-18 (×5): 3 mL via RESPIRATORY_TRACT
  Filled 2023-05-17 (×5): qty 3

## 2023-05-17 MED ORDER — PANTOPRAZOLE SODIUM 40 MG PO TBEC: 40.0000 mg | DELAYED_RELEASE_TABLET | Freq: Every day | ORAL | Status: DC

## 2023-05-17 MED ORDER — GUAIFENESIN ER 600 MG PO TB12
600.0000 mg | ORAL_TABLET | Freq: Two times a day (BID) | ORAL | Status: DC
  Administered 2023-05-17 – 2023-05-18 (×3): 600 mg via ORAL
  Filled 2023-05-17 (×3): qty 1

## 2023-05-17 MED ORDER — OXYCODONE HCL 5 MG PO TABS
10.0000 mg | ORAL_TABLET | ORAL | Status: DC | PRN
  Administered 2023-05-17 (×2): 10 mg via ORAL
  Filled 2023-05-17 (×2): qty 2

## 2023-05-17 MED ORDER — RANOLAZINE ER 500 MG PO TB12
1000.0000 mg | ORAL_TABLET | Freq: Two times a day (BID) | ORAL | Status: DC
  Administered 2023-05-17 – 2023-05-18 (×3): 1000 mg via ORAL
  Filled 2023-05-17 (×3): qty 2

## 2023-05-17 MED ORDER — FUROSEMIDE 10 MG/ML IJ SOLN
40.0000 mg | Freq: Two times a day (BID) | INTRAMUSCULAR | Status: DC
  Administered 2023-05-17 – 2023-05-18 (×2): 40 mg via INTRAVENOUS
  Filled 2023-05-17 (×2): qty 4

## 2023-05-17 MED ORDER — GLIPIZIDE 5 MG PO TABS: 5.0000 mg | ORAL_TABLET | Freq: Every day | ORAL | Status: DC | PRN

## 2023-05-17 MED ORDER — DICYCLOMINE HCL 10 MG PO CAPS: 10.0000 mg | ORAL_CAPSULE | Freq: Three times a day (TID) | ORAL | Status: DC | PRN

## 2023-05-17 MED ORDER — ONDANSETRON HCL 4 MG PO TABS
4.0000 mg | ORAL_TABLET | Freq: Three times a day (TID) | ORAL | Status: DC | PRN
  Administered 2023-05-17: 4 mg via ORAL
  Filled 2023-05-17: qty 1

## 2023-05-17 MED ORDER — LISINOPRIL 20 MG PO TABS
20.0000 mg | ORAL_TABLET | Freq: Every day | ORAL | Status: DC
  Administered 2023-05-17 – 2023-05-18 (×2): 20 mg via ORAL
  Filled 2023-05-17: qty 1
  Filled 2023-05-17: qty 2

## 2023-05-17 MED ORDER — INSULIN ASPART 100 UNIT/ML IJ SOLN
0.0000 [IU] | Freq: Three times a day (TID) | INTRAMUSCULAR | Status: DC
  Administered 2023-05-17 (×2): 3 [IU] via SUBCUTANEOUS
  Administered 2023-05-18: 2 [IU] via SUBCUTANEOUS
  Administered 2023-05-18: 3 [IU] via SUBCUTANEOUS
  Filled 2023-05-17 (×4): qty 1

## 2023-05-17 MED ORDER — METHYLPREDNISOLONE SODIUM SUCC 40 MG IJ SOLR
40.0000 mg | Freq: Two times a day (BID) | INTRAMUSCULAR | Status: DC
  Administered 2023-05-17 – 2023-05-18 (×3): 40 mg via INTRAVENOUS
  Filled 2023-05-17 (×3): qty 1

## 2023-05-17 MED ORDER — TRAZODONE HCL 50 MG PO TABS: 25.0000 mg | ORAL_TABLET | Freq: Every evening | ORAL | Status: DC | PRN

## 2023-05-17 MED ORDER — FUROSEMIDE 10 MG/ML IJ SOLN
40.0000 mg | Freq: Once | INTRAMUSCULAR | Status: AC
  Administered 2023-05-17: 40 mg via INTRAVENOUS
  Filled 2023-05-17: qty 4

## 2023-05-17 MED ORDER — OXYCODONE HCL 5 MG PO TABS
10.0000 mg | ORAL_TABLET | ORAL | Status: DC
  Administered 2023-05-17 – 2023-05-18 (×4): 10 mg via ORAL
  Filled 2023-05-17 (×5): qty 2

## 2023-05-17 NOTE — ED Notes (Signed)
Report given to Johnna Acosta, RN. Pt's nebulizer treatment given to RN to administer after transport to cpod.

## 2023-05-17 NOTE — Assessment & Plan Note (Signed)
-   We will continue statin therapy, Toprol-XL, Imdur and Plavix as well as ACE inhibitor therapy.

## 2023-05-17 NOTE — Progress Notes (Signed)
Reviewed and agree with patient's detailed plan of care. Home Health Certification and Plan of Care from 05/08/2023 through 07/06/2023 signed and sent to medical records to be faxed to home health agency.

## 2023-05-17 NOTE — ED Notes (Signed)
Sent secure chat to overnight MD to ensure I could administer Pts trazadone and ativan as Pt requested with his HR ranging from 48-52. MD stated it was safe and to administer trazadone first and ativan later if it is needed

## 2023-05-17 NOTE — Hospital Course (Signed)
Lawrence [name] is a 67 y.o. Caucasian male with medical history significant for atrial fibrillation, coronary artery disease, asthma, osteoarthritis, anxiety, DDD, diverticulosis, essential hypertension and PSVT, type II diabetes mellitus and OSA, who presented to the emergency room with acute onset of midsternal and left-sided chest pain felt as tightness and graded 9/10 in severity with no radiation however with associated nausea as well as dyspnea and palpitations.  06/30-early 07/01: to ED and admission to hospitalist service for CHF exacerbation and chest pain eval.   Consultants:  ***  Procedures: ***      ASSESSMENT & PLAN:   Principal Problem:   Chest pain Active Problems:   Acute on chronic diastolic CHF (congestive heart failure) (HCC)   Essential hypertension   GERD without esophagitis   Paroxysmal atrial fibrillation (HCC)   Type 2 diabetes mellitus without complications (HCC)   Coronary artery disease   Mixed dyslipidemia   Chest pain - The patient will be admitted to an observation cardiac telemetry bed. - Will follow serial troponins and EKGs. - The patient will be placed on aspirin as well as p.r.n. sublingual nitroglycerin and morphine sulfate for pain. - We will obtain a cardiology consult in a.m. for further cardiac risk stratification. - I notified Dr. Darrold Junker about the patient    Acute on chronic diastolic CHF (congestive heart failure) (HCC) - The patient had a 2D echo on 6/15 revealing EF of 50 to 55% and grade 3 diastolic dysfunction with mild to moderate left atrial dilatation and mild right atrial dilatation, trivial mitral regurgitation and trivial aortic regurgitation. - He will be diuresed with IV Lasix and will follow serial troponins. - Cardiology consult will be obtained as mentioned above. - We will continue Toprol-XL.  Type 2 diabetes mellitus without complications (HCC) - The patient will be placed on supplemental coverage with  NovoLog. - Will continue  glipizide at and hold off metformin  Essential hypertension - We will continue his antihypertensives.  Paroxysmal atrial fibrillation (HCC) - We will continue amiodarone and Eliquis.  Coronary artery disease - We will continue statin therapy, Toprol-XL, Imdur and Plavix as well as ACE inhibitor therapy.  Mixed dyslipidemia Will continue statin therapy and Lovaza  GERD without esophagitis - We will continue PPI therapy.    DVT prophylaxis: *** Pertinent IV fluids/nutrition: *** Central lines / invasive devices: ***  Code Status: *** ACP documentation reviewed: ***  Current Admission Status: ***  TOC needs / Dispo plan: *** Barriers to discharge / significant pending items: ***

## 2023-05-17 NOTE — Assessment & Plan Note (Signed)
-   We will continue PPI therapy 

## 2023-05-17 NOTE — Patient Instructions (Signed)
.   Please review the attached list of medications and notify my office if there are any errors.   . Please bring all of your medications to every appointment so we can make sure that our medication list is the same as yours.   

## 2023-05-17 NOTE — Assessment & Plan Note (Signed)
-   We will continue his antihypertensives. 

## 2023-05-17 NOTE — Assessment & Plan Note (Signed)
-   The patient had a 2D echo on 6/15 revealing EF of 50 to 55% and grade 3 diastolic dysfunction with mild to moderate left atrial dilatation and mild right atrial dilatation, trivial mitral regurgitation and trivial aortic regurgitation. - He will be diuresed with IV Lasix and will follow serial troponins. - Cardiology consult will be obtained as mentioned above. - We will continue Toprol-XL.

## 2023-05-17 NOTE — Assessment & Plan Note (Signed)
-   The patient will be placed on supplemental coverage with NovoLog. - Will continue  glipizide at and hold off metformin

## 2023-05-17 NOTE — Assessment & Plan Note (Signed)
-   The patient will be admitted to an observation cardiac telemetry bed. - Will follow serial troponins and EKGs. - The patient will be placed on aspirin as well as p.r.n. sublingual nitroglycerin and morphine sulfate for pain. - We will obtain a cardiology consult in a.m. for further cardiac risk stratification. - I notified Dr. Darrold Junker about the patient

## 2023-05-17 NOTE — Progress Notes (Addendum)
Brief Progress Note (See full H&P from earlier today)   Subjective: Pt feeling chest pain about the same as when he came to the ED   Objective: Relevant new results:  See below - reviewed all results this hospitalization  Physical Exam:  BP 113/74   Pulse 69   Temp 98.1 F (36.7 C) (Oral)   Resp 18   Ht 5\' 7"  (1.702 m)   SpO2 97%   BMI 28.69 kg/m  Physical Exam Constitutional:      General: He is not in acute distress. Cardiovascular:     Rate and Rhythm: Normal rate and regular rhythm.  Pulmonary:     Breath sounds: Examination of the right-lower field reveals rales. Examination of the left-lower field reveals rales. Decreased breath sounds and rales present.  Abdominal:     General: Bowel sounds are normal.     Palpations: Abdomen is soft.  Musculoskeletal:     Right lower leg: No edema.     Left lower leg: No edema.  Neurological:     Mental Status: He is alert.      Assessment/Plan changes or updates compared to H&P: Cardiology has seen pt - no invasive diagnostics, continue IV lasix, noted chronic supply/demand mismatch and no concern w/ slightly elevated troponins         Results for orders placed or performed during the hospital encounter of 05/16/23 (from the past 48 hour(s))  CBC with Differential/Platelet     Status: Abnormal   Collection Time: 05/16/23 11:52 PM  Result Value Ref Range   WBC 7.1 4.0 - 10.5 K/uL   RBC 4.55 4.22 - 5.81 MIL/uL   Hemoglobin 12.6 (L) 13.0 - 17.0 g/dL   HCT 82.9 56.2 - 13.0 %   MCV 88.8 80.0 - 100.0 fL   MCH 27.7 26.0 - 34.0 pg   MCHC 31.2 30.0 - 36.0 g/dL   RDW 86.5 (H) 78.4 - 69.6 %   Platelets 164 150 - 400 K/uL   nRBC 0.0 0.0 - 0.2 %   Neutrophils Relative % 76 %   Neutro Abs 5.5 1.7 - 7.7 K/uL   Lymphocytes Relative 14 %   Lymphs Abs 1.0 0.7 - 4.0 K/uL   Monocytes Relative 7 %   Monocytes Absolute 0.5 0.1 - 1.0 K/uL   Eosinophils Relative 2 %   Eosinophils Absolute 0.1 0.0 - 0.5 K/uL   Basophils  Relative 0 %   Basophils Absolute 0.0 0.0 - 0.1 K/uL   Immature Granulocytes 1 %   Abs Immature Granulocytes 0.04 0.00 - 0.07 K/uL    Comment: Performed at [facility], [address] [place], [place] [address]  Brain natriuretic peptide     Status: Abnormal   Collection Time: 05/16/23 11:52 PM  Result Value Ref Range   B Natriuretic Peptide 826.8 (H) 0.0 - 100.0 pg/mL    Comment: Performed at [facility], [address] [place], [place] [address]  Comprehensive metabolic panel     Status: Abnormal   Collection Time: 05/16/23 11:52 PM  Result Value Ref Range   Sodium 139 135 - 145 mmol/L   Potassium 4.0 3.5 - 5.1 mmol/L   Chloride 107 98 - 111 mmol/L   CO2 25 22 - 32 mmol/L   Glucose, Bld 119 (H) 70 - 99 mg/dL    Comment: Glucose reference range applies only to samples taken after fasting for at least 8 hours.   BUN 13 8 - 23 mg/dL  Creatinine, Ser 0.83 0.61 - 1.24 mg/dL   Calcium 9.0 8.9 - 78.2 mg/dL   Total Protein 6.6 6.5 - 8.1 g/dL   Albumin 3.5 3.5 - 5.0 g/dL   AST 15 15 - 41 U/L   ALT 13 0 - 44 U/L   Alkaline Phosphatase 51 38 - 126 U/L   Total Bilirubin 1.0 0.3 - 1.2 mg/dL   GFR, Estimated >95 >62 mL/min    Comment: (NOTE) Calculated using the CKD-EPI Creatinine Equation (2021)    Anion gap 7 5 - 15    Comment: Performed at [facility], [address], [place], [place] [address]  Troponin I (High Sensitivity)     Status: Abnormal   Collection Time: 05/16/23 11:52 PM  Result Value Ref Range   Troponin I (High Sensitivity) 34 (H) <18 ng/L    Comment: (NOTE) Elevated high sensitivity troponin I (hsTnI) values and significant  changes across serial measurements may suggest ACS but many other  chronic and acute conditions are known to elevate hsTnI results.  Refer to the "Links" section for chest pain algorithms and additional  guidance. Performed at [facility], [address] [place], [place] [address]   Magnesium      Status: None   Collection Time: 05/16/23 11:52 PM  Result Value Ref Range   Magnesium 2.0 1.7 - 2.4 mg/dL    Comment: Performed at [facility], [address] [place], [place] [address]  Troponin I (High Sensitivity)     Status: Abnormal   Collection Time: 05/17/23  1:15 AM  Result Value Ref Range   Troponin I (High Sensitivity) 31 (H) <18 ng/L    Comment: (NOTE) Elevated high sensitivity troponin I (hsTnI) values and significant  changes across serial measurements may suggest ACS but many other  chronic and acute conditions are known to elevate hsTnI results.  Refer to the "Links" section for chest pain algorithms and additional  guidance. Performed at [facility], [address] [place], [place] [address]   Troponin I (High Sensitivity)     Status: Abnormal   Collection Time: 05/17/23  3:30 AM  Result Value Ref Range   Troponin I (High Sensitivity) 30 (H) <18 ng/L    Comment: (NOTE) Elevated high sensitivity troponin I (hsTnI) values and significant  changes across serial measurements may suggest ACS but many other  chronic and acute conditions are known to elevate hsTnI results.  Refer to the "Links" section for chest pain algorithms and additional  guidance. Performed at [facility], [address] [place], [place] [address]   Basic metabolic panel     Status: Abnormal   Collection Time: 05/17/23  7:46 AM  Result Value Ref Range   Sodium 137 135 - 145 mmol/L   Potassium 4.3 3.5 - 5.1 mmol/L   Chloride 102 98 - 111 mmol/L   CO2 26 22 - 32 mmol/L   Glucose, Bld 174 (H) 70 - 99 mg/dL    Comment: Glucose reference range applies only to samples taken after fasting for at least 8 hours.   BUN 17 8 - 23 mg/dL   Creatinine, Ser 0.27 0.61 - 1.24 mg/dL   Calcium 9.1 8.9 - 25.3 mg/dL   GFR, Estimated >66 >44 mL/min    Comment: (NOTE) Calculated using the CKD-EPI Creatinine Equation (2021)    Anion gap 9 5 - 15    Comment: Performed at  [facility], [address] [place], [place] [address]  CBC     Status: Abnormal   Collection Time: 05/17/23  7:46 AM  Result Value Ref Range   WBC 6.1 4.0 - 10.5 K/uL   RBC 4.80 4.22 - 5.81 MIL/uL   Hemoglobin 13.4 13.0 - 17.0 g/dL   HCT 16.1 09.6 - 04.5 %   MCV 89.6 80.0 - 100.0 fL   MCH 27.9 26.0 - 34.0 pg   MCHC 31.2 30.0 - 36.0 g/dL   RDW 40.9 (H) 81.1 - 91.4 %   Platelets 165 150 - 400 K/uL   nRBC 0.0 0.0 - 0.2 %    Comment: Performed at [facility], [address] [place], [place] [address]  CBG monitoring, ED     Status: Abnormal   Collection Time: 05/17/23 12:21 PM  Result Value Ref Range   Glucose-Capillary 225 (H) 70 - 99 mg/dL    Comment: Glucose reference range applies only to samples taken after fasting for at least 8 hours.  CBG monitoring, ED     Status: Abnormal   Collection Time: 05/17/23  4:10 PM  Result Value Ref Range   Glucose-Capillary 205 (H) 70 - 99 mg/dL    Comment: Glucose reference range applies only to samples taken after fasting for at least 8 hours.   *Note: Due to a large number of results and/or encounters for the requested time period, some results have not been displayed. A complete set of results can be found in Results Review.   DG Chest 2 View  Result Date: 05/17/2023 CLINICAL DATA:  Chest pain and shortness of breath. 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 There is a stable mediastinum. There is aortic atherosclerosis and slight tortuosity. There is mild central vascular prominence with improvement. Mild interstitial edema is noted in the bases and minimal pleural effusions but both findings were greater previously. The lungs are clear of focal infiltrates. Osteopenia and degenerative change thoracic spine. Mild thoracic kyphodextroscoliosis. IMPRESSION: 1. Mild interstitial edema and minimal pleural effusions.  Findings are improved from 04/27/2023. 2. 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 3. Aortic atherosclerosis. 4. No other acute chest findings. Electronically Signed   By: Almira Bar M.D.   On: 05/17/2023 01:02

## 2023-05-17 NOTE — ED Notes (Signed)
Pt requesting xanax 

## 2023-05-17 NOTE — Assessment & Plan Note (Signed)
Will continue statin therapy and Lovaza

## 2023-05-17 NOTE — Assessment & Plan Note (Signed)
-   We will continue amiodarone and Eliquis. 

## 2023-05-17 NOTE — Consult Note (Signed)
[facility] CARDIOLOGY CONSULT NOTE       Patient ID: Lawrence [name] MRN: [id number] DOB/AGE: 05-24-1956 67 y.o.  Admit date: 05/16/2023 Referring Physician Dr. Valente David  Primary Physician Dr. [name] Primary Cardiologist Dr. Juliann Pares Reason for Consultation chest pain   HPI: Lawrence [name] is a 67 y.o. male with a past medical history of CAD (h/o STEMI s/p CABG x2 LIMA-LAD and SVG-OM1 2002 c/b coronary artery dissection with multiple subsequent PCI and stents), chronic stable angina, HFpEF (50-55%, mild LVH 10/2022) paroxysmal atrial fibrillation s/p unsuccessful DCCV 06/29/2022, COPD (PRN 2L), hx tobacco use, DM2 who presented to the ED 05/16/2023 with chest pain. Cardiology is consulted for further assistance.  The patient is well-known to our service from many prior admissions, most recently 2 weeks ago he was admitted with 9/10 chest pain and troponins were elevated to a peak of 10,494 C/W NSTEMI which was medically managed with 48 hours of heparin, switching aspirin to Plavix to take with Eliquis 5 mg twice daily with his history of paroxysmal AF.   Since his last hospital discharge on 6/18 he missed an appointment at his PCPs office 6/21 and was seen in the emergency department on 6/28 for profound diarrhea.  His GI panel was positive for E. coli and was started on a course of antibiotics and discharged from the ED without admission.  He presents today feeling "rough" and states that he was sitting on the couch when he started having chest pain similar to his prior angina before he had his last heart catheterization.  He characterizes his chest pain in multiple ways as a pressure, sharp, and stabbing on the left side without radiation.  He did not trial any medications occasions at home to help his chest pain.  He has occasional palpitations and occasional leg swelling but none presently.  He said his breathing felt "off" and this got better with the nebulizer.  He denies  orthopnea or PND.  He cannot tell me any of the medications he takes on a daily basis at home, but talks about how morphine and fentanyl seem to help his chest pain.  He was extremely hypertensive when he first presented with blood pressure 175/98, heart rate was in the 50s and sinus bradycardia on telemetry.  He is saturating well on oxygen by nasal cannula.  Labs are notable for high-sensitivity troponin borderline elevated and flat trending since 6/28 when it was checked at 37, first check today was 34, 31, and 30.  BNP slightly elevated at 826.  BUN/creatinine within normal limits at 17/0.8 and GFR >60.  H&H also within normal limits at 13.4/43.0 and platelets 165.   Review of systems complete and found to be negative unless listed above     Past Medical History:  Diagnosis Date   A-fib (HCC)    Anemia    Anginal pain (HCC)    Anxiety    Arthritis    Asthma    CAD (coronary artery disease)    a. 2002 CABGx2 (LIMA->LAD, VG->VG->OM1);  b. 09/2012 DES->OM;  c. 03/2015 PTCA of LAD [facility]) in setting of atretic LIMA; d. 05/2015 Cath [facility]): nonobs dzs; e. 06/2015 Cath ([facility]): LM nl, LAD 45p/d ISR, 50d, D1/2 small, LCX 50p/d ISR, OM1 70ost, 30 ISR, VG->OM1 50ost, 9m, LIMA->LAD 99p/d - atretic, RCA dom, nl; f.cath 10/16: 40-50%(FFR 0.90) pLAD, 75% (FFR 0.77) mLAD s/p PCI/DES, oRCA 40% (FFR0.95)   Cancer (HCC)    SKIN CANCER ON BACK  Celiac disease    Chronic diastolic CHF (congestive heart failure) (HCC)    a. 06/2009 Echo: EF 60-65%, Gr 1 DD, triv AI, mildly dil LA, nl RV.   COPD (chronic obstructive pulmonary disease) (HCC)    a. Chronic bronchitis and emphysema.   DDD (degenerative disc disease), lumbar    Diverticulosis    Dysrhythmia    Essential hypertension    GERD (gastroesophageal reflux disease)    History of hiatal hernia    History of kidney stones    H/O   History of tobacco abuse    a. Quit 2014.   Myocardial infarction [facility]) 2002   4 STENTS   Pancreatitis    PSVT  (paroxysmal supraventricular tachycardia)    a. 10/2012 Noted on Zio Patch.   Sleep apnea    LOST CORD TO CPAP -ONLY 02 @ BEDTIME   Tubular adenoma of colon    Type II diabetes mellitus (HCC)     Past Surgical History:  Procedure Laterality Date   BYPASS GRAFT     CARDIAC CATHETERIZATION N/A 07/12/2015   rocedure: Left Heart Cath and Cors/Grafts Angiography;  Surgeon: Lyn Records, MD;  Location: [facility] INVASIVE CV LAB;  Service: Cardiovascular;  Laterality: N/A;   CARDIAC CATHETERIZATION Right 10/07/2015   Procedure: Left Heart Cath and Cors/Grafts Angiography;  Surgeon: Laurier Nancy, MD;  Location: [facility] INVASIVE CV LAB;  Service: Cardiovascular;  Laterality: Right;   CARDIAC CATHETERIZATION N/A 04/06/2016   Procedure: Left Heart Cath and Coronary Angiography;  Surgeon: Alwyn Pea, MD;  Location: [facility] INVASIVE CV LAB;  Service: Cardiovascular;  Laterality: N/A;   CARDIAC CATHETERIZATION  04/06/2016   Procedure: Bypass Graft Angiography;  Surgeon: Alwyn Pea, MD;  Location: [facility] INVASIVE CV LAB;  Service: Cardiovascular;;   CARDIAC CATHETERIZATION N/A 11/02/2016   Procedure: Left Heart Cath and Cors/Grafts Angiography and possible PCI;  Surgeon: Alwyn Pea, MD;  Location: [facility] INVASIVE CV LAB;  Service: Cardiovascular;  Laterality: N/A;   CARDIAC CATHETERIZATION N/A 11/02/2016   Procedure: Coronary Stent Intervention;  Surgeon: Alwyn Pea, MD;  Location: [facility] INVASIVE CV LAB;  Service: Cardiovascular;  Laterality: N/A;   CARDIOVERSION N/A 06/29/2022   Procedure: CARDIOVERSION;  Surgeon: Lamar Blinks, MD;  Location: [facility] ORS;  Service: Cardiovascular;  Laterality: N/A;   CHOLECYSTECTOMY     CIRCUMCISION N/A 06/09/2019   Procedure: CIRCUMCISION ADULT;  Surgeon: Sondra Come, MD;  Location: [facility] ORS;  Service: Urology;  Laterality: N/A;   COLONOSCOPY WITH PROPOFOL N/A 04/01/2018   Procedure: COLONOSCOPY WITH PROPOFOL;  Surgeon: Scot Jun, MD;  Location:  [facility] ENDOSCOPY;  Service: Endoscopy;  Laterality: N/A;   COLONOSCOPY WITH PROPOFOL N/A 05/01/2023   Procedure: COLONOSCOPY WITH PROPOFOL;  Surgeon: Toney Reil, MD;  Location: [facility] ENDOSCOPY;  Service: Gastroenterology;  Laterality: N/A;   ESOPHAGEAL DILATION     ESOPHAGOGASTRODUODENOSCOPY (EGD) WITH PROPOFOL N/A 04/01/2018   Procedure: ESOPHAGOGASTRODUODENOSCOPY (EGD) WITH PROPOFOL;  Surgeon: Scot Jun, MD;  Location: [facility] ENDOSCOPY;  Service: Endoscopy;  Laterality: N/A;   ESOPHAGOGASTRODUODENOSCOPY (EGD) WITH PROPOFOL N/A 05/01/2023   Procedure: ESOPHAGOGASTRODUODENOSCOPY (EGD) WITH PROPOFOL;  Surgeon: Toney Reil, MD;  Location: [facility] ENDOSCOPY;  Service: Gastroenterology;  Laterality: N/A;   GIVENS CAPSULE STUDY  05/01/2023   Procedure: GIVENS CAPSULE STUDY;  Surgeon: Toney Reil, MD;  Location: [facility] ENDOSCOPY;  Service: Gastroenterology;;   LEFT HEART CATH AND CORS/GRAFTS ANGIOGRAPHY N/A 06/12/2019   Procedure: LEFT HEART CATH AND CORS/GRAFTS ANGIOGRAPHY;  Surgeon: Dalia Heading, MD;  Location: [facility] INVASIVE CV LAB;  Service: Cardiovascular;  Laterality: N/A;   LEFT HEART CATH AND CORS/GRAFTS ANGIOGRAPHY N/A 03/11/2020   Procedure: LEFT HEART CATH AND CORS/GRAFTS ANGIOGRAPHY;  Surgeon: Marcina Millard, MD;  Location: [facility] INVASIVE CV LAB;  Service: Cardiovascular;  Laterality: N/A;   LEFT HEART CATH AND CORS/GRAFTS ANGIOGRAPHY N/A 05/01/2021   Procedure: LEFT HEART CATH AND CORS/GRAFTS ANGIOGRAPHY;  Surgeon: Lamar Blinks, MD;  Location: [facility] INVASIVE CV LAB;  Service: Cardiovascular;  Laterality: N/A;   LEFT HEART CATH AND CORS/GRAFTS ANGIOGRAPHY N/A 11/02/2022   Procedure: LEFT HEART CATH AND CORS/GRAFTS ANGIOGRAPHY;  Surgeon: Alwyn Pea, MD;  Location: [facility] INVASIVE CV LAB;  Service: Cardiovascular;  Laterality: N/A;   TONSILLECTOMY     VASCULAR SURGERY      (Not in a hospital admission)  Social History   Socioeconomic History   Marital  status: Widowed    Spouse name: Not on file   Number of children: 1   Years of education: Not on file   Highest education level: 10th grade  Occupational History   Occupation: Disabled   Occupation: on Tree surgeon  Tobacco Use   Smoking status: Former    Packs/day: 3.00    Years: 50.00    Additional pack years: 0.00    Total pack years: 150.00    Types: Cigarettes    Quit date: 04/22/2013    Years since quitting: 10.0   Smokeless tobacco: Never   Tobacco comments:    Reports not smoking for approx 8 years.  Vaping Use   Vaping Use: Never used  Substance and Sexual Activity   Alcohol use: No    Comment: remotely quit alcohol use. Hx of heavy alcohol use.   Drug use: Yes    Types: Marijuana    Comment: occasionally   Sexual activity: Not Currently  Other Topics Concern   Not on file  Social History Narrative   Pt lives in [place] with wife.  Does not routinely exercise.   Social Determinants of Health   Financial Resource Strain: Low Risk  (03/10/2023)   Overall Financial Resource Strain (CARDIA)    Difficulty of Paying Living Expenses: Not hard at all  Food Insecurity: No Food Insecurity (05/17/2023)   Hunger Vital Sign    Worried About Running Out of Food in the Last Year: Never true    Ran Out of Food in the Last Year: Never true  Transportation Needs: No Transportation Needs (05/17/2023)   PRAPARE - Administrator, Civil Service (Medical): No    Lack of Transportation (Non-Medical): No  Physical Activity: Inactive (03/10/2023)   Exercise Vital Sign    Days of Exercise per Week: 0 days    Minutes of Exercise per Session: 0 min  Stress: No Stress Concern Present (03/10/2023)   [employer] of Occupational Health - Occupational Stress Questionnaire    Feeling of Stress : Not at all  Social Connections: Moderately Isolated (03/10/2023)   Social Connection and Isolation Panel [NHANES]    Frequency of Communication with Friends and Family: More than  three times a week    Frequency of Social Gatherings with Friends and Family: More than three times a week    Attends Religious Services: More than 4 times per year    Active Member of [employer] or Organizations: No    Attends Banker Meetings: Not on file    Marital Status: Widowed  Intimate Partner Violence:  Not At Risk (05/17/2023)   Humiliation, Afraid, Rape, and Kick questionnaire    Fear of Current or Ex-Partner: No    Emotionally Abused: No    Physically Abused: No    Sexually Abused: No    Family History  Problem Relation Age of Onset   Heart attack Mother    Depression Mother    Heart disease Mother    COPD Mother    Hypertension Mother    Heart attack Father    Diabetes Father    Depression Father    Heart disease Father    Cirrhosis Father    Parkinson's disease Brother      No intake or output data in the 24 hours ending 05/17/23 1055  Vitals:   05/17/23 0530 05/17/23 0740 05/17/23 0745 05/17/23 0900  BP: (!) 166/87   (!) 143/86  Pulse: 70   (!) 49  Resp: 17   14  Temp:  97.7 F (36.5 C)    TempSrc:  Oral    SpO2: 95%   95%  Height:   5\' 7"  (1.702 m)     PHYSICAL EXAM General: Chronically ill-appearing Caucasian male, well nourished, in no acute distress.  Laying at low incline in ED stretcher HEENT:  Normocephalic and atraumatic. Neck:  No JVD.  Lungs: Normal respiratory effort on room air.  Decreased breath sounds without appreciable crackles or wheezes.   Heart: HRRR . Normal S1 and S2 without gallops or murmurs.  Distant heart sounds. Abdomen: Non-distended appearing.  Msk: Normal strength and tone for age. Extremities: Warm and well perfused. No clubbing, cyanosis.  No peripheral edema.  Neuro: Alert and oriented X 3. Psych:  Answers questions appropriately.   Labs: Basic Metabolic Panel: Recent Labs    05/16/23 2352 05/17/23 0746  NA 139 137  K 4.0 4.3  CL 107 102  CO2 25 26  GLUCOSE 119* 174*  BUN 13 17  CREATININE 0.83 0.80   CALCIUM 9.0 9.1  MG 2.0  --    Liver Function Tests: Recent Labs    05/14/23 1725 05/16/23 2352  AST 18 15  ALT 16 13  ALKPHOS 54 51  BILITOT 0.6 1.0  PROT 7.0 6.6  ALBUMIN 3.8 3.5   Recent Labs    05/14/23 1725  LIPASE 28   CBC: Recent Labs    05/16/23 2352 05/17/23 0746  WBC 7.1 6.1  NEUTROABS 5.5  --   HGB 12.6* 13.4  HCT 40.4 43.0  MCV 88.8 89.6  PLT 164 165   Cardiac Enzymes: Recent Labs    05/16/23 2352 05/17/23 0115 05/17/23 0330  TROPONINIHS 34* 31* 30*   BNP: Recent Labs    05/16/23 2352  BNP 826.8*   D-Dimer: No results for input(s): "DDIMER" in the last 72 hours. Hemoglobin A1C: No results for input(s): "HGBA1C" in the last 72 hours. Fasting Lipid Panel: No results for input(s): "CHOL", "HDL", "LDLCALC", "TRIG", "CHOLHDL", "LDLDIRECT" in the last 72 hours. Thyroid Function Tests: No results for input(s): "TSH", "T4TOTAL", "T3FREE", "THYROIDAB" in the last 72 hours.  Invalid input(s): "FREET3" Anemia Panel: No results for input(s): "VITAMINB12", "FOLATE", "FERRITIN", "TIBC", "IRON", "RETICCTPCT" in the last 72 hours.   Radiology: DG Chest 2 View  Result Date: 05/17/2023 CLINICAL DATA:  Chest pain and shortness of breath. 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 There is a stable mediastinum. There is aortic  atherosclerosis and slight tortuosity. There is mild central vascular prominence with improvement. Mild interstitial edema is noted in the bases and minimal pleural effusions but both findings were greater previously. The lungs are clear of focal infiltrates. Osteopenia and degenerative change thoracic spine. Mild thoracic kyphodextroscoliosis. IMPRESSION: 1. Mild interstitial edema and minimal pleural effusions. Findings are improved from 04/27/2023. 2. 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 3. Aortic atherosclerosis. 4. No  other acute chest findings. Electronically Signed   By: Almira Bar M.D.   On: 05/17/2023 01:02   CT ABDOMEN PELVIS W CONTRAST  Result Date: 05/14/2023 CLINICAL DATA:  abdominal pain Pt presents to ED with c/o of diarrhea, pt states HX of ulcers, pt states recently admitted for same. Pt states possible blood in stool. EXAM: CT ABDOMEN AND PELVIS WITH CONTRAST TECHNIQUE: Multidetector CT imaging of the abdomen and pelvis was performed using the standard protocol following bolus administration of intravenous contrast. RADIATION DOSE REDUCTION: This exam was performed according to the departmental dose-optimization program which includes automated exposure control, adjustment of the mA and/or kV according to patient size and/or use of iterative reconstruction technique. CONTRAST:  OMNIPAQUE IOHEXOL 300 MG/ML  SOLN COMPARISON:  CT abdomen pelvis 04/27/2023 FINDINGS: Lower chest: Diffuse bronchial wall thickening. Trace left pleural effusion. Distal esophageal wall thickening. Left base atelectasis. Hepatobiliary: No focal liver abnormality. Status post cholecystectomy. No biliary dilatation. Pancreas: No focal lesion. Normal pancreatic contour. No surrounding inflammatory changes. No main pancreatic ductal dilatation. Spleen: Normal in size without focal abnormality. Adrenals/Urinary Tract: A chronic stable 1.5 cm left adrenal gland nodule with a density of 58 Hounsfield units consistent with an adrenal adenoma-no further follow-up indicated. No right adrenal gland nodule. Bilateral kidneys enhance symmetrically. No hydronephrosis. No hydroureter. The urinary bladder is unremarkable. Stomach/Bowel: Gastroesophageal junction wall thickening (2:16). No evidence of bowel wall thickening or dilatation. Colonic diverticulosis. Appendix appears normal. Vascular/Lymphatic: No abdominal aorta or iliac aneurysm. Moderate atherosclerotic plaque of the aorta and its branches. No abdominal, pelvic, or inguinal  lymphadenopathy. Reproductive: Prostate is unremarkable. Other: No intraperitoneal free fluid. No intraperitoneal free gas. No organized fluid collection. Musculoskeletal: Tiny left fat containing inguinal hernia. No suspicious lytic or blastic osseous lesions. No acute displaced fracture. IMPRESSION: 1. Distal esophageal and gastroesophageal junction wall thickening. Underlying malignancy or ulceration with inflammation not excluded. Recommend direct visualization. 2. Colonic diverticulosis with no acute diverticulitis. 3. Tiny left fat containing inguinal hernia. 4.  Aortic Atherosclerosis (ICD10-I70.0). Electronically Signed   By: Tish Frederickson M.D.   On: 05/14/2023 20:37   ECHOCARDIOGRAM COMPLETE  Result Date: 05/01/2023    ECHOCARDIOGRAM REPORT   Patient Name:   [facility] Date of Exam: 05/01/2023 Medical Rec #:  [id number]       Height:       67.0 in Accession #:    [id number]      Weight:       183.2 lb Date of Birth:  11/11/1956       BSA:          1.948 m Patient Age:    67 years        BP:           143/93 mmHg Patient Gender: M               HR:           72 bpm. Exam Location:  [facility] Procedure: 2D Echo and Intracardiac Opacification Agent Indications:     CHF I50.21  History:         Patient has prior history of Echocardiogram examinations, most                  recent 11/02/2022.  Sonographer:     Overton Mam RDCS, FASE Referring Phys:  [id number] Gillis Santa Diagnosing Phys: Alwyn Pea MD  Sonographer Comments: Technically difficult study due to poor echo windows and suboptimal apical window. Image acquisition challenging due to respiratory motion. IMPRESSIONS  1. Left ventricular ejection fraction, by estimation, is 50 to 55%. The left ventricle has low normal function. The left ventricle has no regional wall motion abnormalities. There is mild concentric left ventricular hypertrophy. Left ventricular diastolic parameters are consistent with Grade III diastolic dysfunction  (restrictive).  2. Right ventricular systolic function is normal. The right ventricular size is normal.  3. Left atrial size was mild to moderately dilated.  4. Right atrial size was mildly dilated.  5. The mitral valve is normal in structure. Trivial mitral valve regurgitation.  6. The aortic valve is normal in structure. Aortic valve regurgitation is trivial. Aortic valve sclerosis is present, with no evidence of aortic valve stenosis. FINDINGS  Left Ventricle: Left ventricular ejection fraction, by estimation, is 50 to 55%. The left ventricle has low normal function. The left ventricle has no regional wall motion abnormalities. Definity contrast agent was given IV to delineate the left ventricular endocardial borders. The left ventricular internal cavity size was normal in size. There is mild concentric left ventricular hypertrophy. Left ventricular diastolic parameters are consistent with Grade III diastolic dysfunction (restrictive). Right Ventricle: The right ventricular size is normal. No increase in right ventricular wall thickness. Right ventricular systolic function is normal. Left Atrium: Left atrial size was mild to moderately dilated. Right Atrium: Right atrial size was mildly dilated. Pericardium: There is no evidence of pericardial effusion. Mitral Valve: The mitral valve is normal in structure. Trivial mitral valve regurgitation. Tricuspid Valve: The tricuspid valve is normal in structure. Tricuspid valve regurgitation is mild. Aortic Valve: The aortic valve is normal in structure. Aortic valve regurgitation is trivial. Aortic valve sclerosis is present, with no evidence of aortic valve stenosis. Aortic valve peak gradient measures 4.9 mmHg. Pulmonic Valve: The pulmonic valve was normal in structure. Pulmonic valve regurgitation is not visualized. Aorta: The ascending aorta was not well visualized. IAS/Shunts: No atrial level shunt detected by color flow Doppler.  LEFT VENTRICLE PLAX 2D LVIDd:          4.60 cm      Diastology LVIDs:         3.40 cm      LV e' medial:    3.59 cm/s LV PW:         1.30 cm      LV E/e' medial:  27.9 LV IVS:        1.30 cm      LV e' lateral:   13.90 cm/s LVOT diam:     2.00 cm      LV E/e' lateral: 7.2 LV SV:         58 LV SV Index:   30 LVOT Area:     3.14 cm  LV Volumes (MOD) LV vol d, MOD A2C: 139.0 ml LV vol d, MOD A4C: 95.8 ml LV vol s, MOD A2C: 58.0 ml LV vol s, MOD A4C: 42.1 ml LV SV MOD A2C:     81.0 ml LV SV MOD A4C:     95.8 ml LV SV  MOD BP:      67.0 ml RIGHT VENTRICLE RV Basal diam:  3.30 cm RV S prime:     9.68 cm/s TAPSE (M-mode): 1.4 cm LEFT ATRIUM             Index        RIGHT ATRIUM           Index LA diam:        5.10 cm 2.62 cm/m   RA Area:     16.10 cm LA Vol (A2C):   55.9 ml 28.69 ml/m  RA Volume:   39.10 ml  20.07 ml/m LA Vol (A4C):   82.0 ml 42.09 ml/m LA Biplane Vol: 74.5 ml 38.24 ml/m  AORTIC VALVE                 PULMONIC VALVE AV Area (Vmax): 2.94 cm     PV Vmax:        0.79 m/s AV Vmax:        111.00 cm/s  PV Peak grad:   2.5 mmHg AV Peak Grad:   4.9 mmHg     RVOT Peak grad: 2 mmHg LVOT Vmax:      104.00 cm/s LVOT Vmean:     67.200 cm/s LVOT VTI:       0.185 m  AORTA Ao Root diam: 3.70 cm Ao Asc diam:  3.30 cm MITRAL VALVE MV Area (PHT): 4.36 cm     SHUNTS MV Decel Time: 174 msec     Systemic VTI:  0.18 m MV E velocity: 100.00 cm/s  Systemic Diam: 2.00 cm MV A velocity: 29.30 cm/s MV E/A ratio:  3.41 Alwyn Pea MD Electronically signed by Alwyn Pea MD Signature Date/Time: 05/01/2023/4:54:24 PM    Final    CT ABDOMEN PELVIS W CONTRAST  Result Date: 04/27/2023 CLINICAL DATA:  Fall at home today. Blunt abdominal trauma. Abdominal pain. On Eliquis. EXAM: CT ABDOMEN AND PELVIS WITH CONTRAST TECHNIQUE: Multidetector CT imaging of the abdomen and pelvis was performed using the standard protocol following bolus administration of intravenous contrast. RADIATION DOSE REDUCTION: This exam was performed according to the departmental  dose-optimization program which includes automated exposure control, adjustment of the mA and/or kV according to patient size and/or use of iterative reconstruction technique. CONTRAST:  OMNIPAQUE IOHEXOL 300 MG/ML  SOLN COMPARISON:  01/27/2023 FINDINGS: Lower chest: New multifocal areas of airspace opacity are seen in both lower lobes. New tiny bilateral pleural effusions also seen bilaterally. Hepatobiliary: No hepatic laceration or mass identified. Prior cholecystectomy. No evidence of biliary obstruction. Pancreas: No parenchymal laceration, mass, or inflammatory changes identified. Spleen: No evidence of splenic laceration. Adrenal/Urinary Tract: No hemorrhage or parenchymal lacerations identified. No evidence of suspicious renal masses or hydronephrosis. Unremarkable unopacified urinary bladder. Stomach/Bowel: Unopacified bowel loops are unremarkable in appearance. No evidence of hemoperitoneum. Vascular/Lymphatic: No evidence of abdominal aortic injury or retroperitoneal hemorrhage. Aortic atherosclerotic calcification incidentally noted. No pathologically enlarged lymph nodes identified. Reproductive:  No mass or other significant abnormality identified. Other:  Stable small left inguinal hernia, which contains only fat. Musculoskeletal: No acute fractures or suspicious bone lesions identified. IMPRESSION: No evidence of visceral injury, hemorrhage, or other acute findings within the abdomen or pelvis. New multifocal airspace opacities in both lower lobes, and tiny bilateral pleural effusions. Differential diagnosis includes pulmonary contusion, aspiration, and pneumonia. Stable small left inguinal hernia, which contains only fat. Electronically Signed   By: Danae Orleans M.D.   On: 04/27/2023 16:53  DG Chest 2 View  Result Date: 04/27/2023 CLINICAL DATA:  Weakness EXAM: CHEST - 2 VIEW COMPARISON:  Chest x-ray dated March 12, 2023 FINDINGS: Cardiac and mediastinal contours are unchanged status post  median sternotomy. Increased diffuse interstitial opacities. Trace bilateral pleural effusions. No evidence of pneumothorax. IMPRESSION: 1. Increased diffuse interstitial opacities, consistent with worsened pulmonary edema. 2. Trace bilateral pleural effusions. Electronically Signed   By: Allegra Lai M.D.   On: 04/27/2023 16:50   CT HEAD WO CONTRAST ( )  Result Date: 04/27/2023 CLINICAL DATA:  Mental status change, unknown cause.  Fall. EXAM: CT HEAD WITHOUT CONTRAST TECHNIQUE: Contiguous axial images were obtained from the base of the skull through the vertex without intravenous contrast. RADIATION DOSE REDUCTION: This exam was performed according to the departmental dose-optimization program which includes automated exposure control, adjustment of the mA and/or kV according to patient size and/or use of iterative reconstruction technique. COMPARISON:  Head CT 03/27/2023 and MRI 03/28/2023 FINDINGS: Brain: There is no evidence of an acute infarct, intracranial hemorrhage, mass, midline shift, or extra-axial fluid collection. Mild cerebral atrophy is within normal limits for age. Cerebral white matter hypodensities are unchanged and nonspecific but compatible with mild chronic small vessel ischemic disease. Vascular: No hyperdense vessel. Skull: No acute fracture or suspicious osseous lesion. Sinuses/Orbits: Mild mucosal thickening in the paranasal sinuses. No significant mastoid fluid. Unremarkable orbits. Other: None. IMPRESSION: 1. No evidence of acute intracranial abnormality. 2. Mild chronic small vessel ischemic disease. Electronically Signed   By: Sebastian Ache M.D.   On: 04/27/2023 16:35    ECHO  1. Left ventricular ejection fraction, by estimation, is 50 to 55%. The  left ventricle has low normal function. The left ventricle has no regional  wall motion abnormalities. There is mild concentric left ventricular  hypertrophy. Left ventricular  diastolic parameters are consistent with Grade III  diastolic dysfunction  (restrictive).   2. Right ventricular systolic function is normal. The right ventricular  size is normal.   3. Left atrial size was mild to moderately dilated.   4. Right atrial size was mildly dilated.   5. The mitral valve is normal in structure. Trivial mitral valve  regurgitation.   6. The aortic valve is normal in structure. Aortic valve regurgitation is  trivial. Aortic valve sclerosis is present, with no evidence of aortic  valve stenosis.   TELEMETRY reviewed by me (LT) 05/17/2023 : Sinus bradycardia to sinus rhythm rate 48 to 60s  EKG reviewed by me: sinus brady 58 nonspecific TW changes similar to prior from 05/01/23  Data reviewed by me (LT) 05/17/2023: ED note, admission H&P, last discharge summary from admission 04/2023 last 24h vitals tele labs imaging I/O   Principal Problem:   Chest pain Active Problems:   Coronary artery disease   GERD without esophagitis   Paroxysmal atrial fibrillation (HCC)   Acute on chronic diastolic CHF (congestive heart failure) (HCC)   Type 2 diabetes mellitus without complications (HCC)   Essential hypertension   Mixed dyslipidemia    ASSESSMENT AND PLAN:  RUSH BRUST is a 67 y.o. 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 Cardiology is consulted for further assistance.  # CAD s/p  CABG with chronic angina # Demand ischemia Presents with acute on chronic chest pain that started at rest and relieved somewhat with opiates in the emergency department, he characterizes as a sharp, stabbing, and pressure c/w his chronic angina.  Unclear if the patient has been taking his medications at home as he is  very confused about the multiple changes that his have been made after his recent admissions.  Fortunately, his EKG is without acute/new ST or T wave changes and troponins are only borderline elevated and flat trending in the 30s, which is consistent with his chronic troponin elevation/demand supply mismatch and not ACS.. -Continue Plavix 75 mg daily and Eliquis 5 mg twice daily -Continue atorvastatin 80 mg daily -Continue metoprolol XL 50 mg daily -Continue lisinopril 20mg  daily  -Continue Imdur 120 mg daily -Continue Ranexa 1000 mg twice daily -No plan for invasive cardiac diagnostics at this time  # Acute on chronic HFpEF Presents with his breathing feeling "off" and some peripheral edema, BNP elevated in the 800s, chest x-ray with mild interstitial edema and minimal pleural effusions.  Clinically euvolemic. -Continue diuresis with 40 mg of IV Lasix twice daily for now.  # hx COPD Using his as needed 2 L at present, DuoNebs, management per primary.  # Paroxysmal AF In sinus rhythm to sinus bradycardia today, continue amiodarone 200 mg daily,, metoprolol XL 50 mg daily, and Eliquis 5 mg twice daily.  This patient's plan of care was discussed and created with Dr. Darrold Junker and he is in agreement.  Signed: Rebeca Allegra , PA-C 05/17/2023, 10:55 AM [facility]

## 2023-05-17 NOTE — H&P (Signed)
[place]   PATIENT NAME: Lawrence [name]    MR#:  [id number]  DATE OF BIRTH:  1956/07/30  DATE OF ADMISSION:  05/16/2023  PRIMARY CARE PHYSICIAN: Malva Limes, MD   Patient is coming from: Home  REQUESTING/REFERRING PHYSICIAN: Delton Prairie, MD  CHIEF COMPLAINT:   Chief Complaint  Patient presents with   Chest Pain    Chest pain L side x 2 hrs. Hx of COPD, states he felt he needed to call EMS for help    HISTORY OF PRESENT ILLNESS:  Lawrence [name] is a 67 y.o. Caucasian male with medical history significant for atrial fibrillation, coronary artery disease, asthma, osteoarthritis, anxiety, DDD, diverticulosis, essential hypertension and PSVT, type II diabetes mellitus and OSA, who presented to the emergency room with acute onset of midsternal and left-sided chest pain felt as tightness and graded 9/10 in severity with no radiation however with associated nausea as well as dyspnea and palpitations.  He admitted to orthopnea and paroxysmal nocturnal dyspnea as well as mild lower extremity edema.  He denied any fever or chills.  No dysuria, oliguria or hematuria or flank pain.  ED Course: When he came to the ER, BP was 179/87 with heart rate of 57 and pulse oximetry of 97% on 2 L O2 by nasal cannula.  Labs revealed unremarkable CMP.  BN P was 826.8.  High sensitive troponin I was 34 then 31 and later 30.  CBC was unremarkable. EKG as reviewed by me : EKG showed sinus rhythm with a rate of 58 with a anteroseptal Q waves Imaging: Two-view chest x-ray showed mild cardiomegaly and mild interstitial pulmonary edema with minimal pleural effusions, improved from 04/27/2023, CABG changes and aortic atherosclerosis.  The patient was given fentanyl 50 mcg IV twice, 40 mg of IV Lasix, DuoNeb and 60 mg p.o. prednisone.  He will be admitted to an observation cardiac telemetry bed for further evaluation and management. PAST MEDICAL HISTORY:   Past Medical History:  Diagnosis Date   A-fib  (HCC)    Anemia    Anginal pain (HCC)    Anxiety    Arthritis    Asthma    CAD (coronary artery disease)    a. 2002 CABGx2 (LIMA->LAD, VG->VG->OM1);  b. 09/2012 DES->OM;  c. 03/2015 PTCA of LAD [facility]) in setting of atretic LIMA; d. 05/2015 Cath [facility]): nonobs dzs; e. 06/2015 Cath ([facility]): LM nl, LAD 45p/d ISR, 50d, D1/2 small, LCX 50p/d ISR, OM1 70ost, 30 ISR, VG->OM1 50ost, 55m, LIMA->LAD 99p/d - atretic, RCA dom, nl; f.cath 10/16: 40-50%(FFR 0.90) pLAD, 75% (FFR 0.77) mLAD s/p PCI/DES, oRCA 40% (FFR0.95)   Cancer (HCC)    SKIN CANCER ON BACK   Celiac disease    Chronic diastolic CHF (congestive heart failure) (HCC)    a. 06/2009 Echo: EF 60-65%, Gr 1 DD, triv AI, mildly dil LA, nl RV.   COPD (chronic obstructive pulmonary disease) (HCC)    a. Chronic bronchitis and emphysema.   DDD (degenerative disc disease), lumbar    Diverticulosis    Dysrhythmia    Essential hypertension    GERD (gastroesophageal reflux disease)    History of hiatal hernia    History of kidney stones    H/O   History of tobacco abuse    a. Quit 2014.   Myocardial infarction [facility]) 2002   4 STENTS   Pancreatitis    PSVT (paroxysmal supraventricular tachycardia)    a. 10/2012 Noted on Zio Patch.  Sleep apnea    LOST CORD TO CPAP -ONLY 02 @ BEDTIME   Tubular adenoma of colon    Type II diabetes mellitus (HCC)     PAST SURGICAL HISTORY:   Past Surgical History:  Procedure Laterality Date   BYPASS GRAFT     CARDIAC CATHETERIZATION N/A 07/12/2015   rocedure: Left Heart Cath and Cors/Grafts Angiography;  Surgeon: Lyn Records, MD;  Location[facility] INVASIVE CV LAB;  Service: Cardiovascular;  Laterality: N/A;   CARDIAC CATHETERIZATION Right 10/07/2015   Procedure: Left Heart Cath and Cors/Grafts Angiography;  Surgeon: Laurier Nancy, MD;  Location: [facility] INVASIVE CV LAB;  Service: Cardiovascular;  Laterality: Right;   CARDIAC CATHETERIZATION N/A 04/06/2016   Procedure: Left Heart Cath and Coronary Angiography;   Surgeon: Alwyn Pea, MD;  Location: [facility] INVASIVE CV LAB;  Service: Cardiovascular;  Laterality: N/A;   CARDIAC CATHETERIZATION  04/06/2016   Procedure: Bypass Graft Angiography;  Surgeon: Alwyn Pea, MD;  Location: [facility] INVASIVE CV LAB;  Service: Cardiovascular;;   CARDIAC CATHETERIZATION N/A 11/02/2016   Procedure: Left Heart Cath and Cors/Grafts Angiography and possible PCI;  Surgeon: Alwyn Pea, MD;  Location: [facility] INVASIVE CV LAB;  Service: Cardiovascular;  Laterality: N/A;   CARDIAC CATHETERIZATION N/A 11/02/2016   Procedure: Coronary Stent Intervention;  Surgeon: Alwyn Pea, MD;  Location: [facility] INVASIVE CV LAB;  Service: Cardiovascular;  Laterality: N/A;   CARDIOVERSION N/A 06/29/2022   Procedure: CARDIOVERSION;  Surgeon: Lamar Blinks, MD;  Location: [facility] ORS;  Service: Cardiovascular;  Laterality: N/A;   CHOLECYSTECTOMY     CIRCUMCISION N/A 06/09/2019   Procedure: CIRCUMCISION ADULT;  Surgeon: Sondra Come, MD;  Location: [facility] ORS;  Service: Urology;  Laterality: N/A;   COLONOSCOPY WITH PROPOFOL N/A 04/01/2018   Procedure: COLONOSCOPY WITH PROPOFOL;  Surgeon: Scot Jun, MD;  Location: [facility] ENDOSCOPY;  Service: Endoscopy;  Laterality: N/A;   COLONOSCOPY WITH PROPOFOL N/A 05/01/2023   Procedure: COLONOSCOPY WITH PROPOFOL;  Surgeon: Toney Reil, MD;  Location: [facility] ENDOSCOPY;  Service: Gastroenterology;  Laterality: N/A;   ESOPHAGEAL DILATION     ESOPHAGOGASTRODUODENOSCOPY (EGD) WITH PROPOFOL N/A 04/01/2018   Procedure: ESOPHAGOGASTRODUODENOSCOPY (EGD) WITH PROPOFOL;  Surgeon: Scot Jun, MD;  Location: [facility] ENDOSCOPY;  Service: Endoscopy;  Laterality: N/A;   ESOPHAGOGASTRODUODENOSCOPY (EGD) WITH PROPOFOL N/A 05/01/2023   Procedure: ESOPHAGOGASTRODUODENOSCOPY (EGD) WITH PROPOFOL;  Surgeon: Toney Reil, MD;  Location: [facility] ENDOSCOPY;  Service: Gastroenterology;  Laterality: N/A;   GIVENS CAPSULE STUDY  05/01/2023   Procedure:  GIVENS CAPSULE STUDY;  Surgeon: Toney Reil, MD;  Location: [facility] ENDOSCOPY;  Service: Gastroenterology;;   LEFT HEART CATH AND CORS/GRAFTS ANGIOGRAPHY N/A 06/12/2019   Procedure: LEFT HEART CATH AND CORS/GRAFTS ANGIOGRAPHY;  Surgeon: Dalia Heading, MD;  Location: [facility] INVASIVE CV LAB;  Service: Cardiovascular;  Laterality: N/A;   LEFT HEART CATH AND CORS/GRAFTS ANGIOGRAPHY N/A 03/11/2020   Procedure: LEFT HEART CATH AND CORS/GRAFTS ANGIOGRAPHY;  Surgeon: Marcina Millard, MD;  Location: [facility] INVASIVE CV LAB;  Service: Cardiovascular;  Laterality: N/A;   LEFT HEART CATH AND CORS/GRAFTS ANGIOGRAPHY N/A 05/01/2021   Procedure: LEFT HEART CATH AND CORS/GRAFTS ANGIOGRAPHY;  Surgeon: Lamar Blinks, MD;  Location: [facility] INVASIVE CV LAB;  Service: Cardiovascular;  Laterality: N/A;   LEFT HEART CATH AND CORS/GRAFTS ANGIOGRAPHY N/A 11/02/2022   Procedure: LEFT HEART CATH AND CORS/GRAFTS ANGIOGRAPHY;  Surgeon: Alwyn Pea, MD;  Location: [facility] INVASIVE CV LAB;  Service: Cardiovascular;  Laterality: N/A;  TONSILLECTOMY     VASCULAR SURGERY      SOCIAL HISTORY:   Social History   Tobacco Use   Smoking status: Former    Packs/day: 3.00    Years: 50.00    Additional pack years: 0.00    Total pack years: 150.00    Types: Cigarettes    Quit date: 04/22/2013    Years since quitting: 10.0   Smokeless tobacco: Never   Tobacco comments:    Reports not smoking for approx 8 years.  Substance Use Topics   Alcohol use: No    Comment: remotely quit alcohol use. Hx of heavy alcohol use.    FAMILY HISTORY:   Family History  Problem Relation Age of Onset   Heart attack Mother    Depression Mother    Heart disease Mother    COPD Mother    Hypertension Mother    Heart attack Father    Diabetes Father    Depression Father    Heart disease Father    Cirrhosis Father    Parkinson's disease Brother     DRUG ALLERGIES:   Allergies  Allergen Reactions   Prednisone Other (See  Comments) and Hypertension    Pt states that this medication puts him in A-fib    Demerol  [Meperidine Hcl]    Demerol [Meperidine] Hives   Jardiance [Empagliflozin] Other (See Comments)    Perineal pain   Sulfa Antibiotics Hives   Albuterol Sulfate [Albuterol] Palpitations and Other (See Comments)    Pt currently uses this medication.     Morphine Sulfate Nausea And Vomiting, Rash and Other (See Comments)    Pt states that he is only allergic to the tablet form of this medication.      REVIEW OF SYSTEMS:   ROS As per history of present illness. All pertinent systems were reviewed above. Constitutional, HEENT, cardiovascular, respiratory, GI, GU, musculoskeletal, neuro, psychiatric, endocrine, integumentary and hematologic systems were reviewed and are otherwise negative/unremarkable except for positive findings mentioned above in the HPI.   MEDICATIONS AT HOME:   Prior to Admission medications   Medication Sig Start Date End Date Taking? Authorizing Provider  Accu-Chek Softclix Lancets lancets Use as instructed to check sugar daily for type 2 diabetes. 03/03/21  Yes Malva Limes, MD  acetaminophen (TYLENOL) 325 MG tablet Take 2 tablets (650 mg total) by mouth every 4 (four) hours as needed for headache or mild pain. 08/07/22  Yes Rolly Salter, MD  albuterol (VENTOLIN HFA) 108 (90 Base) MCG/ACT inhaler INHALE 2 PUFFS BY MOUTH EVERY 6 HOURS AS NEEDED FOR SHORTNESS OF BREATH 12/02/21  Yes Malva Limes, MD  allopurinol (ZYLOPRIM) 300 MG tablet TAKE 1 TABLET BY MOUTH TWICE A DAY 03/13/23  Yes Malva Limes, MD  ALPRAZolam Prudy Feeler) 1 MG tablet Take 1 mg by mouth 3 (three) times daily as needed. 04/19/23  Yes [provider]  amiodarone (PACERONE) 200 MG tablet Take 1 tablet (200 mg total) by mouth daily. 05/04/23  Yes Enedina Finner, MD  atorvastatin (LIPITOR) 80 MG tablet Take 1 tablet (80 mg total) by mouth daily. Please schedule office visit before any future refill. 11/19/22   Yes Malva Limes, MD  azithromycin (ZITHROMAX) 500 MG tablet Take 1 tablet (500 mg total) by mouth daily for 3 days. 05/14/23 05/17/23 Yes Phineas Semen, MD  Blood Glucose Calibration (ACCU-CHEK GUIDE CONTROL) LIQD Use with blood glucose monitor as directed 02/20/21  Yes Malva Limes, MD  Blood Glucose  Monitoring Suppl (ACCU-CHEK GUIDE) w/Device KIT Use to check blood sugars as directed 02/20/21  Yes [name], Demetrios Isaacs, MD  Budeson-Glycopyrrol-Formoterol (BREZTRI AEROSPHERE) 160-9-4.8 MCG/ACT AERO Inhale 2 puffs into the lungs in the morning and at bedtime. 02/02/23  Yes [name], MD  celecoxib (CELEBREX) 200 MG capsule Take 1 capsule (200 mg total) by mouth 2 (two) times daily as needed. Home med. 11/03/22  Yes [name], MD  cetirizine (ZYRTEC) 10 MG tablet TAKE 1 TABLET BY MOUTH AT BEDTIME 03/12/23  Yes Malva Limes, MD  clopidogrel (PLAVIX) 75 MG tablet Take 1 tablet (75 mg total) by mouth daily. 05/04/23  Yes Enedina Finner, MD  dicyclomine (BENTYL) 10 MG capsule Take 1 capsule (10 mg total) by mouth 3 (three) times daily as needed (abdominal pain). 05/14/23  Yes Phineas Semen, MD  ELIQUIS 5 MG TABS tablet TAKE 1 TABLET BY MOUTH TWICE A DAY 07/30/22  Yes Malva Limes, MD  glipiZIDE (GLUCOTROL) 5 MG tablet Take 1 tablet (5 mg total) by mouth daily as needed (sugar over 300). 03/16/23  Yes Malva Limes, MD  glucose blood (ACCU-CHEK GUIDE) test strip Use as instructed to check sugar daily for type 2 diabetes. 03/03/21  Yes Malva Limes, MD  iron polysaccharides (NIFEREX) 150 MG capsule Take 1 capsule (150 mg total) by mouth daily. 05/04/23  Yes Enedina Finner, MD  isosorbide mononitrate (IMDUR) 120 MG 24 hr tablet Take 1 tablet (120 mg total) by mouth daily. 05/04/23  Yes Enedina Finner, MD  lisinopril (ZESTRIL) 20 MG tablet Take 20 mg by mouth daily. 03/17/23  Yes [provider]  meclizine (ANTIVERT) 25 MG tablet TAKE ONE TABLET BY THREE TIMES A DAY AS NEEDED FOR DIZZINESS  08/13/22  Yes Malva Limes, MD  metFORMIN (GLUCOPHAGE-XR) 500 MG 24 hr tablet TAKE 1 TABLET BY MOUTH TWICE A DAY 10/05/22  Yes Malva Limes, MD  methocarbamol (ROBAXIN) 500 MG tablet Take 500 mg by mouth every 8 (eight) hours as needed for muscle spasms. Take 1 tablet by mouth every hour as needed for muscle spasms   Yes [name], Demetrios Isaacs, MD  metoprolol succinate (TOPROL XL) 50 MG 24 hr tablet Take 1 tablet (50 mg total) by mouth daily. Take with or immediately following a meal. 05/04/23 05/03/24 Yes Enedina Finner, MD  nitroGLYCERIN (NITROSTAT) 0.4 MG SL tablet Place 1 tablet (0.4 mg total) under the tongue every 5 (five) minutes as needed for chest pain. 09/10/22  Yes Drubel, Lillia Abed, PA-C  omega-3 acid ethyl esters (LOVAZA) 1 g capsule Take 4 capsules (4 g total) by mouth daily. 09/10/22  Yes Drubel, Lillia Abed, PA-C  omeprazole (PRILOSEC) 40 MG capsule TAKE 1 CAPSULE BY MOUTH 2 TIMES DAILY 30 MINUTES BEFORE BREAKFAST AND DINNER 07/15/22  Yes Malva Limes, MD  ondansetron (ZOFRAN) 4 MG tablet Take 1 tablet (4 mg total) by mouth every 8 (eight) hours as needed. 05/14/23  Yes Phineas Semen, MD  oxyCODONE-acetaminophen (PERCOCET) 10-325 MG tablet TAKE 1 TABLET BY MOUTH EVERY 4 HOURS AS NEEDED FOR PAIN 04/20/23  Yes Malva Limes, MD  pantoprazole (PROTONIX) 40 MG tablet Take 1 tablet (40 mg total) by mouth daily. 05/04/23  Yes Enedina Finner, MD  ranolazine (RANEXA) 1000 MG SR tablet Take 1 tablet (1,000 mg total) by mouth 2 (two) times daily. 02/22/23  Yes Arnetha Courser, MD  torsemide (DEMADEX) 100 MG tablet Take 0.5 tablets (50 mg total) by mouth daily. 09/10/22  Yes Alfredia Ferguson, PA-C  traZODone (DESYREL) 150 MG tablet TAKE 1 TABLET BY MOUTH AT BEDTIME 11/24/22  Yes Malva Limes, MD  venlafaxine XR (EFFEXOR-XR) 75 MG 24 hr capsule TAKE 1 CAPSULE BY MOUTH DAILY WITH BREAKFAST 02/23/23  Yes Malva Limes, MD  naloxone [facility]) nasal spray 4 mg/0.1 mL 1 spray into nostril x1 and may repeat every  2-3 minutes until patient is responsive or EMS arrives Patient not taking: Reported on 05/14/2023 08/21/20   Malva Limes, MD      VITAL SIGNS:  Blood pressure (!) 166/87, pulse 70, temperature 97.8 F (36.6 C), temperature source Oral, resp. rate 17, SpO2 95 %.  PHYSICAL EXAMINATION:  Physical Exam  GENERAL:  67 y.o.-year-old Caucasian male patient lying in the bed with moderate distress with conversational dyspnea.  EYES: Pupils equal, round, reactive to light and accommodation. No scleral icterus. Extraocular muscles intact.  HEENT: Head atraumatic, normocephalic. Oropharynx and nasopharynx clear.  NECK:  Supple, no jugular venous distention. No thyroid enlargement, no tenderness.  LUNGS: Diminished bibasal breath sounds with bibasal rales as well as residual minimal expiratory wheezes and diminished expiratory airflow with harsh vesicular breathing.  No use of accessory muscles of respiration.  CARDIOVASCULAR: Regular rate and rhythm, S1, S2 normal. No murmurs, rubs, or gallops.  ABDOMEN: Soft, nondistended, nontender. Bowel sounds present. No organomegaly or mass.  EXTREMITIES: Trace bilateral lower extremity pitting edema with no cyanosis or clubbing.  NEUROLOGIC: Cranial nerves II through XII are intact. Muscle strength 5/5 in all extremities. Sensation intact. Gait not checked.  PSYCHIATRIC: The patient is alert and oriented x 3.  Normal affect and good eye contact. SKIN: No obvious rash, lesion, or ulcer.   LABORATORY PANEL:   CBC Recent Labs  Lab 05/16/23 2352  WBC 7.1  HGB 12.6*  HCT 40.4  PLT 164   ------------------------------------------------------------------------------------------------------------------  Chemistries  Recent Labs  Lab 05/16/23 2352  NA 139  K 4.0  CL 107  CO2 25  GLUCOSE 119*  BUN 13  CREATININE 0.83  CALCIUM 9.0  MG 2.0  AST 15  ALT 13  ALKPHOS 51  BILITOT 1.0    ------------------------------------------------------------------------------------------------------------------  Cardiac Enzymes No results for input(s): "TROPONINI" in the last 168 hours. ------------------------------------------------------------------------------------------------------------------  RADIOLOGY:  DG Chest 2 View  Result Date: 05/17/2023 CLINICAL DATA:  Chest pain and shortness of breath. 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 There is a stable mediastinum. There is aortic atherosclerosis and slight tortuosity. There is mild central vascular prominence with improvement. Mild interstitial edema is noted in the bases and minimal pleural effusions but both findings were greater previously. The lungs are clear of focal infiltrates. Osteopenia and degenerative change thoracic spine. Mild thoracic kyphodextroscoliosis. IMPRESSION: 1. Mild interstitial edema and minimal pleural effusions. Findings are improved from 04/27/2023. 2. 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 3. Aortic atherosclerosis. 4. No other acute chest findings. Electronically Signed   By: Almira Bar M.D.   On: 05/17/2023 01:02      IMPRESSION AND PLAN:  Assessment and Plan: * Chest pain - The patient will be admitted to an observation cardiac telemetry bed. - Will follow serial troponins and EKGs. - The patient will be placed on aspirin as well as p.r.n. sublingual nitroglycerin and morphine sulfate for pain. - We will obtain a cardiology consult in a.m. for further cardiac risk stratification. - I notified Dr. Darrold Junker about the  patient    Acute on chronic diastolic CHF (congestive heart failure) (HCC) - The patient had a 2D echo on 6/15 revealing EF of 50 to 55% and grade 3 diastolic dysfunction with mild to moderate left atrial dilatation and mild right atrial dilatation, trivial  mitral regurgitation and trivial aortic regurgitation. - He will be diuresed with IV Lasix and will follow serial troponins. - Cardiology consult will be obtained as mentioned above. - We will continue Toprol-XL.  Essential hypertension - We will continue his antihypertensives.  Paroxysmal atrial fibrillation (HCC) - We will continue amiodarone and Eliquis.  GERD without esophagitis - We will continue PPI therapy.  Type 2 diabetes mellitus without complications (HCC) - The patient will be placed on supplemental coverage with NovoLog. - Will continue  glipizide at and hold off metformin  Mixed dyslipidemia Will continue statin therapy and Lovaza  Coronary artery disease - We will continue statin therapy, Toprol-XL, Imdur and Plavix as well as ACE inhibitor therapy.       DVT prophylaxis: Eliquis. Advanced Care Planning:  Code Status: full code. Family Communication:  The plan of care was discussed in details with the patient (and family). I answered all questions. The patient agreed to proceed with the above mentioned plan. Further management will depend upon hospital course. Disposition Plan: Back to previous home environment Consults called: Cardiology.. All the records are reviewed and case discussed with ED provider.  Status is: Inpatient   At the time of the admission, it appears that the appropriate admission status for this patient is inpatient.  This is judged to be reasonable and necessary in order to provide the required intensity of service to ensure the patient's safety given the presenting symptoms, physical exam findings and initial radiographic and laboratory data in the context of comorbid conditions.  The patient requires inpatient status due to high intensity of service, high risk of further deterioration and high frequency of surveillance required.  I certify that at the time of admission, it is my clinical judgment that the patient will require inpatient  hospital care extending more than 2 midnights.                            Dispo: The patient is from: Home              Anticipated d/c is to: Home              Patient currently is not medically stable to d/c.              Difficult to place patient: No  Hannah Beat M.D on 05/17/2023 at 6:51 AM  [facility]   From 7 PM-7 AM, contact night-coverage [email or web address]  CC: Primary care physician; Malva Limes, MD

## 2023-05-17 NOTE — ED Notes (Signed)
Patient given a breakfast tray.

## 2023-05-18 ENCOUNTER — Telehealth: Payer: Self-pay

## 2023-05-18 ENCOUNTER — Encounter: Payer: Medicare HMO | Admitting: Family

## 2023-05-18 DIAGNOSIS — I25118 Atherosclerotic heart disease of native coronary artery with other forms of angina pectoris: Secondary | ICD-10-CM | POA: Diagnosis not present

## 2023-05-18 LAB — BASIC METABOLIC PANEL
Anion gap: 9 (ref 5–15)
BUN: 25 mg/dL — ABNORMAL HIGH (ref 8–23)
CO2: 29 mmol/L (ref 22–32)
Calcium: 9.5 mg/dL (ref 8.9–10.3)
Chloride: 99 mmol/L (ref 98–111)
Creatinine, Ser: 1.03 mg/dL (ref 0.61–1.24)
GFR, Estimated: >60 mL/min (ref >60)
Glucose, Bld: 234 mg/dL — ABNORMAL HIGH (ref 70–99)
Potassium: 4.3 mmol/L (ref 3.5–5.1)
Sodium: 137 mmol/L (ref 135–145)

## 2023-05-18 LAB — GLUCOSE, CAPILLARY
Glucose-Capillary: 193 mg/dL — ABNORMAL HIGH (ref 70–99)
Glucose-Capillary: 233 mg/dL — ABNORMAL HIGH (ref 70–99)

## 2023-05-18 MED ORDER — METOPROLOL SUCCINATE ER 25 MG PO TB24: 12.5000 mg | ORAL_TABLET | Freq: Every day | ORAL | 0 refills | Status: DC

## 2023-05-18 MED ORDER — METOPROLOL SUCCINATE ER 25 MG PO TB24
12.5000 mg | ORAL_TABLET | Freq: Every day | ORAL | Status: DC
  Filled 2023-05-18: qty 1

## 2023-05-18 MED ORDER — UMECLIDINIUM BROMIDE 62.5 MCG/ACT IN AEPB: 1.0000 | INHALATION_SPRAY | Freq: Every day | RESPIRATORY_TRACT | Status: DC

## 2023-05-18 MED ORDER — MOMETASONE FURO-FORMOTEROL FUM 100-5 MCG/ACT IN AERO: 2.0000 | INHALATION_SPRAY | Freq: Two times a day (BID) | RESPIRATORY_TRACT | Status: DC
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
## 2023-05-18 NOTE — Progress Notes (Signed)
[facility] CARDIOLOGY CONSULT NOTE       Patient ID: Lawrence [name] MRN: [id number] DOB/AGE: [birth year] 67 y.o.  Admit date: 05/16/2023 Referring Physician Dr. Valente David  Primary Physician Dr. [name] Primary Cardiologist Dr. Juliann Pares Reason for Consultation chest pain   HPI: Lawrence [name] is a 67 y.o. male with a past medical history of CAD (h/o STEMI s/p CABG x2 LIMA-LAD and SVG-OM1 2002 c/b coronary artery dissection with multiple subsequent PCI and stents), chronic stable angina, HFpEF (50-55%, mild LVH 10/2022) paroxysmal atrial fibrillation s/p unsuccessful DCCV 06/29/2022, COPD (PRN 2L), hx tobacco use, DM2 who presented to the ED 05/16/2023 with chest pain. Cardiology is consulted for further assistance.  Interval history:  -feels a little better today - chest pain is a 7/10, baseline is a 3-4/10, clinically appears comfortable, conversational, and in no distress - less dyspnea, no peripheral edema. Some transient positional dizziness.  - eager to go home    Review of systems complete and found to be negative unless listed above     Past Medical History:  Diagnosis Date   A-fib (HCC)    Anemia    Anginal pain (HCC)    Anxiety    Arthritis    Asthma    CAD (coronary artery disease)    a. 2002 CABGx2 (LIMA->LAD, VG->VG->OM1);  b. 09/2012 DES->OM;  c. 03/2015 PTCA of LAD [facility]) in setting of atretic LIMA; d. 05/2015 Cath [facility]): nonobs dzs; e. 06/2015 Cath ([facility]): LM nl, LAD 45p/d ISR, 50d, D1/2 small, LCX 50p/d ISR, OM1 70ost, 30 ISR, VG->OM1 50ost, 23m, LIMA->LAD 99p/d - atretic, RCA dom, nl; f.cath 10/16: 40-50%(FFR 0.90) pLAD, 75% (FFR 0.77) mLAD s/p PCI/DES, oRCA 40% (FFR0.95)   Cancer (HCC)    SKIN CANCER ON BACK   Celiac disease    Chronic diastolic CHF (congestive heart failure) (HCC)    a. 06/2009 Echo: EF 60-65%, Gr 1 DD, triv AI, mildly dil LA, nl RV.   COPD (chronic obstructive pulmonary disease) (HCC)    a. Chronic bronchitis and emphysema.   DDD  (degenerative disc disease), lumbar    Diverticulosis    Dysrhythmia    Essential hypertension    GERD (gastroesophageal reflux disease)    History of hiatal hernia    History of kidney stones    H/O   History of tobacco abuse    a. Quit 2014.   Myocardial infarction [facility]) 2002   4 STENTS   Pancreatitis    PSVT (paroxysmal supraventricular tachycardia)    a. 10/2012 Noted on Zio Patch.   Sleep apnea    LOST CORD TO CPAP -ONLY 02 @ BEDTIME   Tubular adenoma of colon    Type II diabetes mellitus (HCC)     Past Surgical History:  Procedure Laterality Date   BYPASS GRAFT     CARDIAC CATHETERIZATION N/A 07/12/2015   rocedure: Left Heart Cath and Cors/Grafts Angiography;  Surgeon: Lyn Records, MD;  Location: [facility] INVASIVE CV LAB;  Service: Cardiovascular;  Laterality: N/A;   CARDIAC CATHETERIZATION Right 10/07/2015   Procedure: Left Heart Cath and Cors/Grafts Angiography;  Surgeon: Laurier Nancy, MD;  Location: [facility] INVASIVE CV LAB;  Service: Cardiovascular;  Laterality: Right;   CARDIAC CATHETERIZATION N/A 04/06/2016   Procedure: Left Heart Cath and Coronary Angiography;  Surgeon: Alwyn Pea, MD;  Location: [facility] INVASIVE CV LAB;  Service: Cardiovascular;  Laterality: N/A;   CARDIAC CATHETERIZATION  04/06/2016   Procedure: Bypass Graft Angiography;  Surgeon: Gerda Diss  Salome Arnt, MD;  Location: [facility] INVASIVE CV LAB;  Service: Cardiovascular;;   CARDIAC CATHETERIZATION N/A 11/02/2016   Procedure: Left Heart Cath and Cors/Grafts Angiography and possible PCI;  Surgeon: Alwyn Pea, MD;  Location: [facility] INVASIVE CV LAB;  Service: Cardiovascular;  Laterality: N/A;   CARDIAC CATHETERIZATION N/A 11/02/2016   Procedure: Coronary Stent Intervention;  Surgeon: Alwyn Pea, MD;  Location: [facility] INVASIVE CV LAB;  Service: Cardiovascular;  Laterality: N/A;   CARDIOVERSION N/A 06/29/2022   Procedure: CARDIOVERSION;  Surgeon: Lamar Blinks, MD;  Location: [facility] ORS;  Service:  Cardiovascular;  Laterality: N/A;   CHOLECYSTECTOMY     CIRCUMCISION N/A 06/09/2019   Procedure: CIRCUMCISION ADULT;  Surgeon: Sondra Come, MD;  Location: [facility] ORS;  Service: Urology;  Laterality: N/A;   COLONOSCOPY WITH PROPOFOL N/A 04/01/2018   Procedure: COLONOSCOPY WITH PROPOFOL;  Surgeon: Scot Jun, MD;  Location: [facility] ENDOSCOPY;  Service: Endoscopy;  Laterality: N/A;   COLONOSCOPY WITH PROPOFOL N/A 05/01/2023   Procedure: COLONOSCOPY WITH PROPOFOL;  Surgeon: Toney Reil, MD;  Location: [facility] ENDOSCOPY;  Service: Gastroenterology;  Laterality: N/A;   ESOPHAGEAL DILATION     ESOPHAGOGASTRODUODENOSCOPY (EGD) WITH PROPOFOL N/A 04/01/2018   Procedure: ESOPHAGOGASTRODUODENOSCOPY (EGD) WITH PROPOFOL;  Surgeon: Scot Jun, MD;  Location: [facility] ENDOSCOPY;  Service: Endoscopy;  Laterality: N/A;   ESOPHAGOGASTRODUODENOSCOPY (EGD) WITH PROPOFOL N/A 05/01/2023   Procedure: ESOPHAGOGASTRODUODENOSCOPY (EGD) WITH PROPOFOL;  Surgeon: Toney Reil, MD;  Location[facility] ENDOSCOPY;  Service: Gastroenterology;  Laterality: N/A;   GIVENS CAPSULE STUDY  05/01/2023   Procedure: GIVENS CAPSULE STUDY;  Surgeon: Toney Reil, MD;  Location: [facility] ENDOSCOPY;  Service: Gastroenterology;;   LEFT HEART CATH AND CORS/GRAFTS ANGIOGRAPHY N/A 06/12/2019   Procedure: LEFT HEART CATH AND CORS/GRAFTS ANGIOGRAPHY;  Surgeon: Dalia Heading, MD;  Location: [facility] INVASIVE CV LAB;  Service: Cardiovascular;  Laterality: N/A;   LEFT HEART CATH AND CORS/GRAFTS ANGIOGRAPHY N/A 03/11/2020   Procedure: LEFT HEART CATH AND CORS/GRAFTS ANGIOGRAPHY;  Surgeon: Marcina Millard, MD;  Location: [facility] INVASIVE CV LAB;  Service: Cardiovascular;  Laterality: N/A;   LEFT HEART CATH AND CORS/GRAFTS ANGIOGRAPHY N/A 05/01/2021   Procedure: LEFT HEART CATH AND CORS/GRAFTS ANGIOGRAPHY;  Surgeon: Lamar Blinks, MD;  Location: [facility] INVASIVE CV LAB;  Service: Cardiovascular;  Laterality: N/A;   LEFT HEART CATH AND  CORS/GRAFTS ANGIOGRAPHY N/A 11/02/2022   Procedure: LEFT HEART CATH AND CORS/GRAFTS ANGIOGRAPHY;  Surgeon: Alwyn Pea, MD;  Location: [facility] INVASIVE CV LAB;  Service: Cardiovascular;  Laterality: N/A;   TONSILLECTOMY     VASCULAR SURGERY      Medications Prior to Admission  Medication Sig Dispense Refill Last Dose   Accu-Chek Softclix Lancets lancets Use as instructed to check sugar daily for type 2 diabetes. 100 each 4 Past Week   acetaminophen (TYLENOL) 325 MG tablet Take 2 tablets (650 mg total) by mouth every 4 (four) hours as needed for headache or mild pain.   unk at unk   albuterol (VENTOLIN HFA) 108 (90 Base) MCG/ACT inhaler INHALE 2 PUFFS BY MOUTH EVERY 6 HOURS AS NEEDED FOR SHORTNESS OF BREATH 17 g 5 unk at unk   allopurinol (ZYLOPRIM) 300 MG tablet TAKE 1 TABLET BY MOUTH TWICE A DAY 180 tablet 4 Past Week   ALPRAZolam (XANAX) 1 MG tablet Take 1 mg by mouth 3 (three) times daily as needed.   unk at unk   amiodarone (PACERONE) 200 MG tablet Take 1 tablet (200 mg total) by  mouth daily. 30 tablet 0 Past Week   atorvastatin (LIPITOR) 80 MG tablet Take 1 tablet (80 mg total) by mouth daily. Please schedule office visit before any future refill. 90 tablet 0 Past Week   [EXPIRED] azithromycin (ZITHROMAX) 500 MG tablet Take 1 tablet (500 mg total) by mouth daily for 3 days. 3 tablet 0 Past Week   Blood Glucose Calibration (ACCU-CHEK GUIDE CONTROL) LIQD Use with blood glucose monitor as directed 1 each 3 Past Week   Blood Glucose Monitoring Suppl (ACCU-CHEK GUIDE) w/Device KIT Use to check blood sugars as directed 1 kit 0 Past Week   Budeson-Glycopyrrol-Formoterol (BREZTRI AEROSPHERE) 160-9-4.8 MCG/ACT AERO Inhale 2 puffs into the lungs in the morning and at bedtime. 11.8 g 0 Past Week   celecoxib (CELEBREX) 200 MG capsule Take 1 capsule (200 mg total) by mouth 2 (two) times daily as needed. Home med.   Past Week   cetirizine (ZYRTEC) 10 MG tablet TAKE 1 TABLET BY MOUTH AT BEDTIME 90  tablet 1 Past Week   clopidogrel (PLAVIX) 75 MG tablet Take 1 tablet (75 mg total) by mouth daily. 30 tablet 0 Past Week   dicyclomine (BENTYL) 10 MG capsule Take 1 capsule (10 mg total) by mouth 3 (three) times daily as needed (abdominal pain). 15 capsule 0 unk at unk   ELIQUIS 5 MG TABS tablet TAKE 1 TABLET BY MOUTH TWICE A DAY 180 tablet 3 Past Week   glipiZIDE (GLUCOTROL) 5 MG tablet Take 1 tablet (5 mg total) by mouth daily as needed (sugar over 300). 30 tablet 0 unk at unk   glucose blood (ACCU-CHEK GUIDE) test strip Use as instructed to check sugar daily for type 2 diabetes. 100 strip 4 Past Week   iron polysaccharides (NIFEREX) 150 MG capsule Take 1 capsule (150 mg total) by mouth daily. 30 capsule 1 Past Week   isosorbide mononitrate (IMDUR) 120 MG 24 hr tablet Take 1 tablet (120 mg total) by mouth daily. 30 tablet 0 Past Week   lisinopril (ZESTRIL) 20 MG tablet Take 20 mg by mouth daily.   Past Week   meclizine (ANTIVERT) 25 MG tablet TAKE ONE TABLET BY THREE TIMES A DAY AS NEEDED FOR DIZZINESS 30 tablet 5 unk at unk   metFORMIN (GLUCOPHAGE-XR) 500 MG 24 hr tablet TAKE 1 TABLET BY MOUTH TWICE A DAY 60 tablet 3 Past Week   methocarbamol (ROBAXIN) 500 MG tablet Take 500 mg by mouth every 8 (eight) hours as needed for muscle spasms. Take 1 tablet by mouth every hour as needed for muscle spasms   unk at unk   metoprolol succinate (TOPROL XL) 50 MG 24 hr tablet Take 1 tablet (50 mg total) by mouth daily. Take with or immediately following a meal. 30 tablet 11 Past Week   nitroGLYCERIN (NITROSTAT) 0.4 MG SL tablet Place 1 tablet (0.4 mg total) under the tongue every 5 (five) minutes as needed for chest pain. 30 tablet 0 unk at unk   omega-3 acid ethyl esters (LOVAZA) 1 g capsule Take 4 capsules (4 g total) by mouth daily. 120 capsule 3 Past Week   omeprazole (PRILOSEC) 40 MG capsule TAKE 1 CAPSULE BY MOUTH 2 TIMES DAILY 30 MINUTES BEFORE BREAKFAST AND DINNER 180 capsule 4 Past Week   ondansetron  (ZOFRAN) 4 MG tablet Take 1 tablet (4 mg total) by mouth every 8 (eight) hours as needed. 20 tablet 0 unk at unk   oxyCODONE-acetaminophen (PERCOCET) 10-325 MG tablet TAKE 1 TABLET BY MOUTH EVERY  4 HOURS AS NEEDED FOR PAIN 180 tablet 0 unk at unk   pantoprazole (PROTONIX) 40 MG tablet Take 1 tablet (40 mg total) by mouth daily. 30 tablet 1 Past Week   ranolazine (RANEXA) 1000 MG SR tablet Take 1 tablet (1,000 mg total) by mouth 2 (two) times daily. 60 tablet 0 Past Week   torsemide (DEMADEX) 100 MG tablet Take 0.5 tablets (50 mg total) by mouth daily. 30 tablet 0 Past Week   traZODone (DESYREL) 150 MG tablet TAKE 1 TABLET BY MOUTH AT BEDTIME 90 tablet 4 Past Week   venlafaxine XR (EFFEXOR-XR) 75 MG 24 hr capsule TAKE 1 CAPSULE BY MOUTH DAILY WITH BREAKFAST 90 capsule 4 Past Week    Social History   Socioeconomic History   Marital status: Widowed    Spouse name: Not on file   Number of children: 1   Years of education: Not on file   Highest education level: 10th grade  Occupational History   Occupation: Disabled   Occupation: on Tree surgeon  Tobacco Use   Smoking status: Former    Packs/day: 3.00    Years: 50.00    Additional pack years: 0.00    Total pack years: 150.00    Types: Cigarettes    Quit date: 04/22/2013    Years since quitting: 10.0   Smokeless tobacco: Never   Tobacco comments:    Reports not smoking for approx 8 years.  Vaping Use   Vaping Use: Never used  Substance and Sexual Activity   Alcohol use: No    Comment: remotely quit alcohol use. Hx of heavy alcohol use.   Drug use: Yes    Types: Marijuana    Comment: occasionally   Sexual activity: Not Currently  Other Topics Concern   Not on file  Social History Narrative   Pt lives in [place] with wife.  Does not routinely exercise.   Social Determinants of Health   Financial Resource Strain: Low Risk  (03/10/2023)   Overall Financial Resource Strain (CARDIA)    Difficulty of Paying Living Expenses:  Not hard at all  Food Insecurity: No Food Insecurity (05/17/2023)   Hunger Vital Sign    Worried About Running Out of Food in the Last Year: Never true    Ran Out of Food in the Last Year: Never true  Transportation Needs: No Transportation Needs (05/17/2023)   PRAPARE - Administrator, Civil Service (Medical): No    Lack of Transportation (Non-Medical): No  Physical Activity: Inactive (03/10/2023)   Exercise Vital Sign    Days of Exercise per Week: 0 days    Minutes of Exercise per Session: 0 min  Stress: No Stress Concern Present (03/10/2023)   [employer] of Occupational Health - Occupational Stress Questionnaire    Feeling of Stress : Not at all  Social Connections: Moderately Isolated (03/10/2023)   Social Connection and Isolation Panel [NHANES]    Frequency of Communication with Friends and Family: More than three times a week    Frequency of Social Gatherings with Friends and Family: More than three times a week    Attends Religious Services: More than 4 times per year    Active Member of [employer] or Organizations: No    Attends Banker Meetings: Not on file    Marital Status: Widowed  Intimate Partner Violence: Not At Risk (05/17/2023)   Humiliation, Afraid, Rape, and Kick questionnaire    Fear of Current or Ex-Partner: No  Emotionally Abused: No    Physically Abused: No    Sexually Abused: No    Family History  Problem Relation Age of Onset   Heart attack Mother    Depression Mother    Heart disease Mother    COPD Mother    Hypertension Mother    Heart attack Father    Diabetes Father    Depression Father    Heart disease Father    Cirrhosis Father    Parkinson's disease Brother       Intake/Output Summary (Last 24 hours) at 05/18/2023 0753 Last data filed at 05/18/2023 0600 Gross per 24 hour  Intake 720 ml  Output 1400 ml  Net -680 ml    Vitals:   05/17/23 2330 05/18/23 0015 05/18/23 0030 05/18/23 0406  BP: 130/66 117/76  (!) 118/56   Pulse: (!) 57 (!) 53  (!) 51  Resp: 14 16  16   Temp:  (!) 97.5 F (36.4 C)  97.6 F (36.4 C)  TempSrc:  Oral  Oral  SpO2: 96% 96%  98%  Weight:   84 kg   Height:   5\' 7"  (1.702 m)     PHYSICAL EXAM General: Chronically ill-appearing Caucasian male, well nourished, in no acute distress.  Laying at incline in bed HEENT:  Normocephalic and atraumatic. Neck:  No JVD.  Lungs: Normal respiratory effort on room air.  Decreased breath sounds without appreciable crackles or wheezes.   Heart: HRRR . Normal S1 and S2 without gallops or murmurs.  Distant heart sounds. Abdomen: Non-distended appearing.  Msk: Normal strength and tone for age. Extremities: Warm and well perfused. No clubbing, cyanosis.  No peripheral edema.  Neuro: Alert and oriented X 3. Psych:  Answers questions appropriately.   Labs: Basic Metabolic Panel: Recent Labs    05/16/23 2352 05/17/23 0746  NA 139 137  K 4.0 4.3  CL 107 102  CO2 25 26  GLUCOSE 119* 174*  BUN 13 17  CREATININE 0.83 0.80  CALCIUM 9.0 9.1  MG 2.0  --     Liver Function Tests: Recent Labs    05/16/23 2352  AST 15  ALT 13  ALKPHOS 51  BILITOT 1.0  PROT 6.6  ALBUMIN 3.5    No results for input(s): "LIPASE", "AMYLASE" in the last 72 hours.  CBC: Recent Labs    05/16/23 2352 05/17/23 0746  WBC 7.1 6.1  NEUTROABS 5.5  --   HGB 12.6* 13.4  HCT 40.4 43.0  MCV 88.8 89.6  PLT 164 165    Cardiac Enzymes: Recent Labs    05/16/23 2352 05/17/23 0115 05/17/23 0330  TROPONINIHS 34* 31* 30*    BNP: Recent Labs    05/16/23 2352  BNP 826.8*    D-Dimer: No results for input(s): "DDIMER" in the last 72 hours. Hemoglobin A1C: No results for input(s): "HGBA1C" in the last 72 hours. Fasting Lipid Panel: No results for input(s): "CHOL", "HDL", "LDLCALC", "TRIG", "CHOLHDL", "LDLDIRECT" in the last 72 hours. Thyroid Function Tests: No results for input(s): "TSH", "T4TOTAL", "T3FREE", "THYROIDAB" in the last 72  hours.  Invalid input(s): "FREET3" Anemia Panel: No results for input(s): "VITAMINB12", "FOLATE", "FERRITIN", "TIBC", "IRON", "RETICCTPCT" in the last 72 hours.   Radiology: DG Chest 2 View  Result Date: 05/17/2023 CLINICAL DATA:  Chest pain and shortness of breath. 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 There is a  stable mediastinum. There is aortic atherosclerosis and slight tortuosity. There is mild central vascular prominence with improvement. Mild interstitial edema is noted in the bases and minimal pleural effusions but both findings were greater previously. The lungs are clear of focal infiltrates. Osteopenia and degenerative change thoracic spine. Mild thoracic kyphodextroscoliosis. IMPRESSION: 1. Mild interstitial edema and minimal pleural effusions. Findings are improved from 04/27/2023. 2. 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 3. Aortic atherosclerosis. 4. No other acute chest findings. Electronically Signed   By: Almira Bar M.D.   On: 05/17/2023 01:02   CT ABDOMEN PELVIS W CONTRAST  Result Date: 05/14/2023 CLINICAL DATA:  abdominal pain Pt presents to ED with c/o of diarrhea, pt states HX of ulcers, pt states recently admitted for same. Pt states possible blood in stool. EXAM: CT ABDOMEN AND PELVIS WITH CONTRAST TECHNIQUE: Multidetector CT imaging of the abdomen and pelvis was performed using the standard protocol following bolus administration of intravenous contrast. RADIATION DOSE REDUCTION: This exam was performed according to the departmental dose-optimization program which includes automated exposure control, adjustment of the mA and/or kV according to patient size and/or use of iterative reconstruction technique. CONTRAST:  OMNIPAQUE IOHEXOL 300 MG/ML  SOLN COMPARISON:  CT abdomen pelvis 04/27/2023 FINDINGS: Lower chest: Diffuse bronchial wall thickening.  Trace left pleural effusion. Distal esophageal wall thickening. Left base atelectasis. Hepatobiliary: No focal liver abnormality. Status post cholecystectomy. No biliary dilatation. Pancreas: No focal lesion. Normal pancreatic contour. No surrounding inflammatory changes. No main pancreatic ductal dilatation. Spleen: Normal in size without focal abnormality. Adrenals/Urinary Tract: A chronic stable 1.5 cm left adrenal gland nodule with a density of 58 Hounsfield units consistent with an adrenal adenoma-no further follow-up indicated. No right adrenal gland nodule. Bilateral kidneys enhance symmetrically. No hydronephrosis. No hydroureter. The urinary bladder is unremarkable. Stomach/Bowel: Gastroesophageal junction wall thickening (2:16). No evidence of bowel wall thickening or dilatation. Colonic diverticulosis. Appendix appears normal. Vascular/Lymphatic: No abdominal aorta or iliac aneurysm. Moderate atherosclerotic plaque of the aorta and its branches. No abdominal, pelvic, or inguinal lymphadenopathy. Reproductive: Prostate is unremarkable. Other: No intraperitoneal free fluid. No intraperitoneal free gas. No organized fluid collection. Musculoskeletal: Tiny left fat containing inguinal hernia. No suspicious lytic or blastic osseous lesions. No acute displaced fracture. IMPRESSION: 1. Distal esophageal and gastroesophageal junction wall thickening. Underlying malignancy or ulceration with inflammation not excluded. Recommend direct visualization. 2. Colonic diverticulosis with no acute diverticulitis. 3. Tiny left fat containing inguinal hernia. 4.  Aortic Atherosclerosis (ICD10-I70.0). Electronically Signed   By: Tish Frederickson M.D.   On: 05/14/2023 20:37   ECHOCARDIOGRAM COMPLETE  Result Date: 05/01/2023    ECHOCARDIOGRAM REPORT   Patient Name:   Lawrence [name] [facility] Date of Exam: 05/01/2023 Medical Rec #:  [id number]       Height:       67.0 in Accession #:    [id number]      Weight:       183.2 lb Date of  Birth:  Aug 13, 1956       BSA:          1.948 m Patient Age:    67 years        BP:           143/93 mmHg Patient Gender: M               HR:           72 bpm. Exam Location:  [facility] Procedure: 2D Echo and Intracardiac Opacification Agent Indications:  CHF I50.21  History:         Patient has prior history of Echocardiogram examinations, most                  recent 11/02/2022.  Sonographer:     Overton Mam RDCS, FASE Referring Phys:  [id number] Gillis Santa Diagnosing Phys: Alwyn Pea MD  Sonographer Comments: Technically difficult study due to poor echo windows and suboptimal apical window. Image acquisition challenging due to respiratory motion. IMPRESSIONS  1. Left ventricular ejection fraction, by estimation, is 50 to 55%. The left ventricle has low normal function. The left ventricle has no regional wall motion abnormalities. There is mild concentric left ventricular hypertrophy. Left ventricular diastolic parameters are consistent with Grade III diastolic dysfunction (restrictive).  2. Right ventricular systolic function is normal. The right ventricular size is normal.  3. Left atrial size was mild to moderately dilated.  4. Right atrial size was mildly dilated.  5. The mitral valve is normal in structure. Trivial mitral valve regurgitation.  6. The aortic valve is normal in structure. Aortic valve regurgitation is trivial. Aortic valve sclerosis is present, with no evidence of aortic valve stenosis. FINDINGS  Left Ventricle: Left ventricular ejection fraction, by estimation, is 50 to 55%. The left ventricle has low normal function. The left ventricle has no regional wall motion abnormalities. Definity contrast agent was given IV to delineate the left ventricular endocardial borders. The left ventricular internal cavity size was normal in size. There is mild concentric left ventricular hypertrophy. Left ventricular diastolic parameters are consistent with Grade III diastolic dysfunction  (restrictive). Right Ventricle: The right ventricular size is normal. No increase in right ventricular wall thickness. Right ventricular systolic function is normal. Left Atrium: Left atrial size was mild to moderately dilated. Right Atrium: Right atrial size was mildly dilated. Pericardium: There is no evidence of pericardial effusion. Mitral Valve: The mitral valve is normal in structure. Trivial mitral valve regurgitation. Tricuspid Valve: The tricuspid valve is normal in structure. Tricuspid valve regurgitation is mild. Aortic Valve: The aortic valve is normal in structure. Aortic valve regurgitation is trivial. Aortic valve sclerosis is present, with no evidence of aortic valve stenosis. Aortic valve peak gradient measures 4.9 mmHg. Pulmonic Valve: The pulmonic valve was normal in structure. Pulmonic valve regurgitation is not visualized. Aorta: The ascending aorta was not well visualized. IAS/Shunts: No atrial level shunt detected by color flow Doppler.  LEFT VENTRICLE PLAX 2D LVIDd:         4.60 cm      Diastology LVIDs:         3.40 cm      LV e' medial:    3.59 cm/s LV PW:         1.30 cm      LV E/e' medial:  27.9 LV IVS:        1.30 cm      LV e' lateral:   13.90 cm/s LVOT diam:     2.00 cm      LV E/e' lateral: 7.2 LV SV:         58 LV SV Index:   30 LVOT Area:     3.14 cm  LV Volumes (MOD) LV vol d, MOD A2C: 139.0 ml LV vol d, MOD A4C: 95.8 ml LV vol s, MOD A2C: 58.0 ml LV vol s, MOD A4C: 42.1 ml LV SV MOD A2C:     81.0 ml LV SV MOD A4C:     95.8 ml  LV SV MOD BP:      67.0 ml RIGHT VENTRICLE RV Basal diam:  3.30 cm RV S prime:     9.68 cm/s TAPSE (M-mode): 1.4 cm LEFT ATRIUM             Index        RIGHT ATRIUM           Index LA diam:        5.10 cm 2.62 cm/m   RA Area:     16.10 cm LA Vol (A2C):   55.9 ml 28.69 ml/m  RA Volume:   39.10 ml  20.07 ml/m LA Vol (A4C):   82.0 ml 42.09 ml/m LA Biplane Vol: 74.5 ml 38.24 ml/m  AORTIC VALVE                 PULMONIC VALVE AV Area (Vmax): 2.94 cm      PV Vmax:        0.79 m/s AV Vmax:        111.00 cm/s  PV Peak grad:   2.5 mmHg AV Peak Grad:   4.9 mmHg     RVOT Peak grad: 2 mmHg LVOT Vmax:      104.00 cm/s LVOT Vmean:     67.200 cm/s LVOT VTI:       0.185 m  AORTA Ao Root diam: 3.70 cm Ao Asc diam:  3.30 cm MITRAL VALVE MV Area (PHT): 4.36 cm     SHUNTS MV Decel Time: 174 msec     Systemic VTI:  0.18 m MV E velocity: 100.00 cm/s  Systemic Diam: 2.00 cm MV A velocity: 29.30 cm/s MV E/A ratio:  3.41 Alwyn Pea MD Electronically signed by Alwyn Pea MD Signature Date/Time: 05/01/2023/4:54:24 PM    Final    CT ABDOMEN PELVIS W CONTRAST  Result Date: 04/27/2023 CLINICAL DATA:  Fall at home today. Blunt abdominal trauma. Abdominal pain. On Eliquis. EXAM: CT ABDOMEN AND PELVIS WITH CONTRAST TECHNIQUE: Multidetector CT imaging of the abdomen and pelvis was performed using the standard protocol following bolus administration of intravenous contrast. RADIATION DOSE REDUCTION: This exam was performed according to the departmental dose-optimization program which includes automated exposure control, adjustment of the mA and/or kV according to patient size and/or use of iterative reconstruction technique. CONTRAST:  OMNIPAQUE IOHEXOL 300 MG/ML  SOLN COMPARISON:  01/27/2023 FINDINGS: Lower chest: New multifocal areas of airspace opacity are seen in both lower lobes. New tiny bilateral pleural effusions also seen bilaterally. Hepatobiliary: No hepatic laceration or mass identified. Prior cholecystectomy. No evidence of biliary obstruction. Pancreas: No parenchymal laceration, mass, or inflammatory changes identified. Spleen: No evidence of splenic laceration. Adrenal/Urinary Tract: No hemorrhage or parenchymal lacerations identified. No evidence of suspicious renal masses or hydronephrosis. Unremarkable unopacified urinary bladder. Stomach/Bowel: Unopacified bowel loops are unremarkable in appearance. No evidence of hemoperitoneum. Vascular/Lymphatic: No  evidence of abdominal aortic injury or retroperitoneal hemorrhage. Aortic atherosclerotic calcification incidentally noted. No pathologically enlarged lymph nodes identified. Reproductive:  No mass or other significant abnormality identified. Other:  Stable small left inguinal hernia, which contains only fat. Musculoskeletal: No acute fractures or suspicious bone lesions identified. IMPRESSION: No evidence of visceral injury, hemorrhage, or other acute findings within the abdomen or pelvis. New multifocal airspace opacities in both lower lobes, and tiny bilateral pleural effusions. Differential diagnosis includes pulmonary contusion, aspiration, and pneumonia. Stable small left inguinal hernia, which contains only fat. Electronically Signed   By: Danae Orleans M.D.   On:  04/27/2023 16:53   DG Chest 2 View  Result Date: 04/27/2023 CLINICAL DATA:  Weakness EXAM: CHEST - 2 VIEW COMPARISON:  Chest x-ray dated March 12, 2023 FINDINGS: Cardiac and mediastinal contours are unchanged status post median sternotomy. Increased diffuse interstitial opacities. Trace bilateral pleural effusions. No evidence of pneumothorax. IMPRESSION: 1. Increased diffuse interstitial opacities, consistent with worsened pulmonary edema. 2. Trace bilateral pleural effusions. Electronically Signed   By: Allegra Lai M.D.   On: 04/27/2023 16:50   CT HEAD WO CONTRAST ( )  Result Date: 04/27/2023 CLINICAL DATA:  Mental status change, unknown cause.  Fall. EXAM: CT HEAD WITHOUT CONTRAST TECHNIQUE: Contiguous axial images were obtained from the base of the skull through the vertex without intravenous contrast. RADIATION DOSE REDUCTION: This exam was performed according to the departmental dose-optimization program which includes automated exposure control, adjustment of the mA and/or kV according to patient size and/or use of iterative reconstruction technique. COMPARISON:  Head CT 03/27/2023 and MRI 03/28/2023 FINDINGS: Brain: There is no  evidence of an acute infarct, intracranial hemorrhage, mass, midline shift, or extra-axial fluid collection. Mild cerebral atrophy is within normal limits for age. Cerebral white matter hypodensities are unchanged and nonspecific but compatible with mild chronic small vessel ischemic disease. Vascular: No hyperdense vessel. Skull: No acute fracture or suspicious osseous lesion. Sinuses/Orbits: Mild mucosal thickening in the paranasal sinuses. No significant mastoid fluid. Unremarkable orbits. Other: None. IMPRESSION: 1. No evidence of acute intracranial abnormality. 2. Mild chronic small vessel ischemic disease. Electronically Signed   By: Sebastian Ache M.D.   On: 04/27/2023 16:35    ECHO  1. Left ventricular ejection fraction, by estimation, is 50 to 55%. The  left ventricle has low normal function. The left ventricle has no regional  wall motion abnormalities. There is mild concentric left ventricular  hypertrophy. Left ventricular  diastolic parameters are consistent with Grade III diastolic dysfunction  (restrictive).   2. Right ventricular systolic function is normal. The right ventricular  size is normal.   3. Left atrial size was mild to moderately dilated.   4. Right atrial size was mildly dilated.   5. The mitral valve is normal in structure. Trivial mitral valve  regurgitation.   6. The aortic valve is normal in structure. Aortic valve regurgitation is  trivial. Aortic valve sclerosis is present, with no evidence of aortic  valve stenosis.   TELEMETRY reviewed by me (LT) 05/18/2023 : Sinus bradycardia to sinus rhythm rate 48 to 60s  EKG reviewed by me: sinus brady 58 nonspecific TW changes similar to prior from 05/01/23  Data reviewed by me (LT) 05/18/2023: ED note, admission H&P, last discharge summary from admission 04/2023 last 24h vitals tele labs imaging I/O   Principal Problem:   Chest pain Active Problems:   Coronary artery disease   GERD without esophagitis   Paroxysmal  atrial fibrillation (HCC)   Acute on chronic diastolic CHF (congestive heart failure) (HCC)   Type 2 diabetes mellitus without complications (HCC)   Essential hypertension   Mixed dyslipidemia    ASSESSMENT AND PLAN:  Lawrence [name] is a 67 y.o. 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 Cardiology is consulted for further assistance.  #  CAD s/p CABG with chronic angina # Demand ischemia Presents with acute on chronic chest pain that started at rest and relieved somewhat with opiates in the emergency department, he characterizes as a sharp, stabbing, and pressure c/w his chronic angina.  Unclear if the patient has been taking his medications at home as he is very confused about the multiple changes that his have been made after his recent admissions.  Fortunately, his EKG is without acute/new ST or T wave changes and troponins are only borderline elevated and flat trending in the 30s, which is consistent with his chronic troponin elevation/demand supply mismatch and not ACS.. -Continue Plavix 75 mg daily and Eliquis 5 mg twice daily -Continue atorvastatin 80 mg daily -Continue metoprolol XL but decrease dose from 50 mg to 12.5mg  daily with baseline bradycardia -Continue lisinopril 20mg  daily  -Continue Imdur 120 mg daily -Continue Ranexa 1000 mg twice daily -No plan for invasive cardiac diagnostics, per Dr. Darrold Junker, likely that one of the patient's bypass grafts has occluded with his recent NSTEMI (per his review of films, unlikely to be amenable to an intervention.   # Acute on chronic HFpEF Presents with his breathing feeling "off" and some peripheral edema, BNP  elevated in the 800s, chest x-ray with mild interstitial edema and minimal pleural effusions.  Clinically euvolemic. -s/p IV diruesis   # hx COPD Using 2 L at present which he is on , DuoNebs, management per primary.  # Paroxysmal AF In sinus rhythm to sinus bradycardia today, continue amiodarone 200 mg daily,, metoprolol XL 50 mg daily, and Eliquis 5 mg twice daily.  Ok for discharge today from a cardiac perspective. Will arrange for follow up in clinic with Dr. Juliann Pares in 1-2 weeks.    This patient's plan of care was discussed and created with Dr. Darrold Junker and he is in agreement.  Signed: Rebeca Allegra , PA-C 05/18/2023, 7:53 AM [facility]

## 2023-05-18 NOTE — TOC Transition Note (Signed)
Transition of Care [facility]) - CM/SW Discharge Note   Patient Details  Name: Lawrence [name] MRN: [id number] Date of Birth: 1956-08-31  Transition of [facility]) CM/SW Contact:  Margarito Liner, LCSW Phone Number: 05/18/2023, 12:00 PM   Clinical Narrative:   Patient has orders to discharge home today. Left message for [facility] liaison to notify. No further concerns. CSW signing off.  Final next level of care: Home w Home Health Services Barriers to Discharge: No Barriers Identified   Patient Goals and CMS Choice   Choice offered to / list presented to : Patient  Discharge Placement                  Patient to be transferred to facility by: Friend   Patient and family notified of of transfer: 05/18/23  Discharge Plan and Services Additional resources added to the After Visit Summary for       Post Acute Care Choice: Resumption of Svcs/PTA Provider                    HH Arranged: RN, PT, OT, Social Work [facility] Agency: [facility] Date [facility] Agency Contacted: 05/18/23   Representative spoke with at [facility] Agency: Alfonzo Beers  Social Determinants of Health (SDOH) Interventions SDOH Screenings   Food Insecurity: No Food Insecurity (05/17/2023)  Housing: Low Risk  (05/17/2023)  Transportation Needs: No Transportation Needs (05/17/2023)  Utilities: Not At Risk (05/17/2023)  Alcohol Screen: Low Risk  (03/10/2023)  Depression (PHQ2-9): Low Risk  (03/10/2023)  Financial Resource Strain: Low Risk  (03/10/2023)  Physical Activity: Inactive (03/10/2023)  Social Connections: Moderately Isolated (03/10/2023)  Stress: No Stress Concern Present (03/10/2023)  Tobacco Use: Medium Risk (05/17/2023)     Readmission Risk Interventions    05/18/2023   10:56 AM  Readmission Risk Prevention Plan  Transportation Screening Complete  Medication Review (RN Care Manager) Complete  PCP or Specialist appointment within 3-5 days of discharge Complete  HRI or Home Care Consult Complete  SW  Recovery Care/Counseling Consult Complete  Palliative Care Screening Not Applicable  Skilled Nursing Facility Not Applicable

## 2023-05-18 NOTE — Discharge Summary (Signed)
Physician Discharge Summary   Patient: Lawrence [name] MRN: [id number]  DOB: [birth year]   Admit:     Date of Admission: 05/16/2023 Admitted from: home   Discharge: Date of discharge: 05/18/23 Disposition: Home w/ home health  Condition at discharge: good  CODE STATUS: DNR     Discharge Physician: Sunnie Nielsen, DO [facility]     PCP: Malva Limes, MD  Recommendations for Outpatient Follow-up:  Follow up with PCP Malva Limes, MD in 1-2 weeks Please obtain labs/tests: CBC, BMP  in 1-2 weeks Please follow up on the following pending results: none PCP AND OTHER OUTPATIENT PROVIDERS: SEE BELOW FOR SPECIFIC DISCHARGE INSTRUCTIONS PRINTED FOR PATIENT IN ADDITION TO GENERIC AVS PATIENT INFO    Discharge Instructions     (HEART FAILURE PATIENTS) Call MD:  Anytime you have any of the following symptoms: 1) 3 pound weight gain in 24 hours or 5 pounds in 1 week 2) shortness of breath, with or without a dry hacking cough 3) swelling in the hands, feet or stomach 4) if you have to sleep on extra pillows at night in order to breathe.   Complete by: As directed    Diet - low sodium heart healthy   Complete by: As directed    Increase activity slowly   Complete by: As directed          Discharge Diagnoses: Principal Problem:   Chest pain Active Problems:   Acute on chronic diastolic CHF (congestive heart failure) (HCC)   Essential hypertension   GERD without esophagitis   Paroxysmal atrial fibrillation (HCC)   Type 2 diabetes mellitus without complications (HCC)   Coronary artery disease   Mixed dyslipidemia       Hospital Course: GIA HEMP is a 67 y.o. Caucasian male with medical history significant for atrial fibrillation, coronary artery disease, asthma, osteoarthritis, anxiety, DDD, diverticulosis, essential hypertension and PSVT, type II diabetes mellitus and OSA, who presented to the emergency room with acute onset of midsternal and  left-sided chest pain felt as tightness and graded 9/10 in severity with no radiation however with associated nausea as well as dyspnea and palpitations.  06/30-early 07/01: to ED and admission to hospitalist service for CHF exacerbation and chest pain eval.  07/02: CP improved, no further interventions by cardiology   Consultants:  Cardiology   Procedures: None       ASSESSMENT & PLAN:   Chest pain CAD s/p CABG w/ chronic angina  Demand ischemia  per Dr. Darrold Junker, likely that one of the patient's bypass grafts has occluded with his recent NSTEMI (per his review of films, unlikely to be amenable to an intervention.  Plavix 75 mg daily  Eliquis 5 mg twice daily Atorvastatin 80 mg daily Metoprolol XL decrease dose from 50 mg to 12.5mg  daily with baseline bradycardia lisinopril 20mg  daily  Imdur 120 mg daily Ranexa 1000 mg twice daily No plan for invasive cardiac diagnostics,  Acute on chronic diastolic CHF (congestive heart failure) (HCC) The patient had a 2D echo on 6/15 revealing EF of 50 to 55% and grade 3 diastolic dysfunction with mild to moderate left atrial dilatation and mild right atrial dilatation, trivial mitral regurgitation and trivial aortic regurgitation. S/p IV diuresis Follow w/ cardiology   COPD Continue home O2 F/u w/ pulmonary, given inhalers now to tie him over until pulm f/u   Type 2 diabetes mellitus without complications (HCC) Resume home glipizide and metformin  Essential hypertension Meds as  above  Paroxysmal atrial fibrillation (HCC) Eliquis + amiodarone  Mixed dyslipidemia continue statin therapy and Lovaza  GERD without esophagitis PPI             Discharge Instructions  Allergies as of 05/18/2023       Reactions   Prednisone Other (See Comments), Hypertension   Pt states that this medication puts him in A-fib    Demerol  [meperidine Hcl]    Demerol [meperidine] Hives   Jardiance [empagliflozin] Other (See Comments)    Perineal pain   Sulfa Antibiotics Hives   Albuterol Sulfate [albuterol] Palpitations, Other (See Comments)   Pt currently uses this medication.     Morphine Sulfate Nausea And Vomiting, Rash, Other (See Comments)   Pt states that he is only allergic to the tablet form of this medication.          Medication List     STOP taking these medications    azithromycin 500 MG tablet Commonly known as: Zithromax   Breztri Aerosphere 160-9-4.8 MCG/ACT Aero Generic drug: Budeson-Glycopyrrol-Formoterol       TAKE these medications    Accu-Chek Guide Control Liqd Use with blood glucose monitor as directed   Accu-Chek Guide test strip Generic drug: glucose blood Use as instructed to check sugar daily for type 2 diabetes.   Accu-Chek Guide w/Device Kit Use to check blood sugars as directed   Accu-Chek Softclix Lancets lancets Use as instructed to check sugar daily for type 2 diabetes.   acetaminophen 325 MG tablet Commonly known as: TYLENOL Take 2 tablets (650 mg total) by mouth every 4 (four) hours as needed for headache or mild pain.   albuterol 108 (90 Base) MCG/ACT inhaler Commonly known as: VENTOLIN HFA INHALE 2 PUFFS BY MOUTH EVERY 6 HOURS AS NEEDED FOR SHORTNESS OF BREATH   allopurinol 300 MG tablet Commonly known as: ZYLOPRIM TAKE 1 TABLET BY MOUTH TWICE A DAY   ALPRAZolam 1 MG tablet Commonly known as: XANAX Take 1 mg by mouth 3 (three) times daily as needed.   amiodarone 200 MG tablet Commonly known as: PACERONE Take 1 tablet (200 mg total) by mouth daily.   atorvastatin 80 MG tablet Commonly known as: LIPITOR Take 1 tablet (80 mg total) by mouth daily. Please schedule office visit before any future refill.   celecoxib 200 MG capsule Commonly known as: CELEBREX Take 1 capsule (200 mg total) by mouth 2 (two) times daily as needed. Home med.   cetirizine 10 MG tablet Commonly known as: ZYRTEC TAKE 1 TABLET BY MOUTH AT BEDTIME   clopidogrel 75 MG  tablet Commonly known as: PLAVIX Take 1 tablet (75 mg total) by mouth daily.   dicyclomine 10 MG capsule Commonly known as: BENTYL Take 1 capsule (10 mg total) by mouth 3 (three) times daily as needed (abdominal pain).   Eliquis 5 MG Tabs tablet Generic drug: apixaban TAKE 1 TABLET BY MOUTH TWICE A DAY   glipiZIDE 5 MG tablet Commonly known as: GLUCOTROL Take 1 tablet (5 mg total) by mouth daily as needed (sugar over 300).   iron polysaccharides 150 MG capsule Commonly known as: NIFEREX Take 1 capsule (150 mg total) by mouth daily.   isosorbide mononitrate 120 MG 24 hr tablet Commonly known as: IMDUR Take 1 tablet (120 mg total) by mouth daily.   lisinopril 20 MG tablet Commonly known as: ZESTRIL Take 20 mg by mouth daily.   meclizine 25 MG tablet Commonly known as: ANTIVERT TAKE ONE TABLET BY THREE  TIMES A DAY AS NEEDED FOR DIZZINESS   metFORMIN 500 MG 24 hr tablet Commonly known as: GLUCOPHAGE-XR TAKE 1 TABLET BY MOUTH TWICE A DAY   methocarbamol 500 MG tablet Commonly known as: ROBAXIN Take 500 mg by mouth every 8 (eight) hours as needed for muscle spasms. Take 1 tablet by mouth every hour as needed for muscle spasms   metoprolol succinate 25 MG 24 hr tablet Commonly known as: TOPROL-XL Take 0.5 tablets (12.5 mg total) by mouth daily. Start taking on: May 19, 2023 What changed:  medication strength how much to take additional instructions   mometasone-formoterol 100-5 MCG/ACT Aero Commonly known as: DULERA Inhale 2 puffs into the lungs 2 (two) times daily.   nitroGLYCERIN 0.4 MG SL tablet Commonly known as: NITROSTAT Place 1 tablet (0.4 mg total) under the tongue every 5 (five) minutes as needed for chest pain.   omega-3 acid ethyl esters 1 g capsule Commonly known as: LOVAZA Take 4 capsules (4 g total) by mouth daily.   omeprazole 40 MG capsule Commonly known as: PRILOSEC TAKE 1 CAPSULE BY MOUTH 2 TIMES DAILY 30 MINUTES BEFORE BREAKFAST AND DINNER    ondansetron 4 MG tablet Commonly known as: Zofran Take 1 tablet (4 mg total) by mouth every 8 (eight) hours as needed.   oxyCODONE-acetaminophen 10-325 MG tablet Commonly known as: PERCOCET TAKE 1 TABLET BY MOUTH EVERY 4 HOURS AS NEEDED FOR PAIN   pantoprazole 40 MG tablet Commonly known as: PROTONIX Take 1 tablet (40 mg total) by mouth daily.   ranolazine 1000 MG SR tablet Commonly known as: RANEXA Take 1 tablet (1,000 mg total) by mouth 2 (two) times daily.   torsemide 100 MG tablet Commonly known as: DEMADEX Take 0.5 tablets (50 mg total) by mouth daily.   traZODone 150 MG tablet Commonly known as: DESYREL TAKE 1 TABLET BY MOUTH AT BEDTIME   umeclidinium bromide 62.5 MCG/ACT Aepb Commonly known as: INCRUSE ELLIPTA Inhale 1 puff into the lungs daily. Start taking on: May 19, 2023   venlafaxine XR 75 MG 24 hr capsule Commonly known as: EFFEXOR-XR TAKE 1 CAPSULE BY MOUTH DAILY WITH BREAKFAST         Follow-up Information     [facility] Follow [facility].   Specialty: Home Health Services Why: They will resume home health services at discharge. Contact information: [address] [place] [address] [phone]                 Allergies  Allergen Reactions   Prednisone Other (See Comments) and Hypertension    Pt states that this medication puts him in A-fib    Demerol  [Meperidine Hcl]    Demerol [Meperidine] Hives   Jardiance [Empagliflozin] Other (See Comments)    Perineal pain   Sulfa Antibiotics Hives   Albuterol Sulfate [Albuterol] Palpitations and Other (See Comments)    Pt currently uses this medication.     Morphine Sulfate Nausea And Vomiting, Rash and Other (See Comments)    Pt states that he is only allergic to the tablet form of this medication.       Subjective: pt reports breathign is at baseline, chest pain is improved    Discharge Exam: BP 107/60 (BP Location: Right Arm)   Pulse (!) 51   Temp 97.7 F (36.5 C)    Resp 20   Ht 5\' 7"  (1.702 m)   Wt 84 kg   SpO2 94%   BMI 29.00 kg/m  General: Pt is alert, awake, not in acute distress Cardiovascular: RRR, S1/S2 +murmur, no rubs, no gallops Respiratory: CTA bilaterally Abdominal: Soft, NT, ND Extremities: no edema, no cyanosis     The results of significant diagnostics from this hospitalization (including imaging, microbiology, ancillary and laboratory) are listed below for reference.     Microbiology: Recent Results (from the past 240 hour(s))  Gastrointestinal Panel by PCR , Stool     Status: Abnormal   Collection Time: 05/14/23  7:23 PM   Specimen: Stool  Result Value Ref Range Status   Campylobacter species NOT DETECTED NOT DETECTED Final   Plesimonas shigelloides NOT DETECTED NOT DETECTED Final   Salmonella species NOT DETECTED NOT DETECTED Final   Yersinia enterocolitica NOT DETECTED NOT DETECTED Final   Vibrio species NOT DETECTED NOT DETECTED Final   Vibrio cholerae NOT DETECTED NOT DETECTED Final   Enteroaggregative E coli (EAEC) DETECTED (A) NOT DETECTED Final    Comment: CRITICAL RESULT CALLED TO, READ BACK BY AND VERIFIED WITH: Myra Rude RN @ 2208 05/14/23 [facility]    Enteropathogenic E coli (EPEC) NOT DETECTED NOT DETECTED Final   Enterotoxigenic E coli (ETEC) NOT DETECTED NOT DETECTED Final   Shiga like toxin producing E coli (STEC) NOT DETECTED NOT DETECTED Final   Shigella/Enteroinvasive E coli (EIEC) NOT DETECTED NOT DETECTED Final   Cryptosporidium NOT DETECTED NOT DETECTED Final   Cyclospora cayetanensis NOT DETECTED NOT DETECTED Final   Entamoeba histolytica NOT DETECTED NOT DETECTED Final   Giardia lamblia NOT DETECTED NOT DETECTED Final   Adenovirus F40/41 NOT DETECTED NOT DETECTED Final   Astrovirus NOT DETECTED NOT DETECTED Final   Norovirus GI/GII NOT DETECTED NOT DETECTED Final   Rotavirus A NOT DETECTED NOT DETECTED Final   Sapovirus (I, II, IV, and V) NOT DETECTED NOT DETECTED Final    Comment: Performed at  [facility], [address] [place], [place] [address]  C Difficile Quick Screen w PCR reflex     Status: None   Collection Time: 05/14/23  7:23 PM   Specimen: Stool  Result Value Ref Range Status   C Diff antigen NEGATIVE NEGATIVE Final   C Diff toxin NEGATIVE NEGATIVE Final   C Diff interpretation No C. difficile detected.  Final    Comment: Performed at [facility], [address] [place], [place] [address]     Labs: BNP (last 3 results) Recent Labs    02/06/23 2045 04/27/23 1507 05/16/23 2352  BNP 152.6* 1,093.5* 826.8*   Basic Metabolic Panel: Recent Labs  Lab 05/14/23 1725 05/16/23 2352 05/17/23 0746 05/18/23 0826  NA 139 139 137 137  K 5.2* 4.0 4.3 4.3  CL 103 107 102 99  CO2 28 25 26 29   GLUCOSE 106* 119* 174* 234*  BUN 14 13 17  25*  CREATININE 1.04 0.83 0.80 1.03  CALCIUM 9.7 9.0 9.1 9.5  MG  --  2.0  --   --    Liver Function Tests: Recent Labs  Lab 05/14/23 1725 05/16/23 2352  AST 18 15  ALT 16 13  ALKPHOS 54 51  BILITOT 0.6 1.0  PROT 7.0 6.6  ALBUMIN 3.8 3.5   Recent Labs  Lab 05/14/23 1725  LIPASE 28   No results for input(s): "AMMONIA" in the last 168 hours. CBC: Recent Labs  Lab 05/14/23 1725 05/16/23 2352 05/17/23 0746  WBC 6.5 7.1 6.1  NEUTROABS  --  5.5  --   HGB 14.0 12.6* 13.4  HCT 46.1 40.4  43.0  MCV 91.5 88.8 89.6  PLT 200 164 165   Cardiac Enzymes: No results for input(s): "CKTOTAL", "CKMB", "CKMBINDEX", "TROPONINI" in the last 168 hours. BNP: Invalid input(s): "POCBNP" CBG: Recent Labs  Lab 05/17/23 1221 05/17/23 1610 05/18/23 0758 05/18/23 1215  GLUCAP 225* 205* 193* 233*   D-Dimer No results for input(s): "DDIMER" in the last 72 hours. Hgb A1c No results for input(s): "HGBA1C" in the last 72 hours. Lipid Profile No results for input(s): "CHOL", "HDL", "LDLCALC", "TRIG", "CHOLHDL", "LDLDIRECT" in the last 72 hours. Thyroid function studies No results for input(s): "TSH", "T4TOTAL",  "T3FREE", "THYROIDAB" in the last 72 hours.  Invalid input(s): "FREET3" Anemia work up No results for input(s): "VITAMINB12", "FOLATE", "FERRITIN", "TIBC", "IRON", "RETICCTPCT" in the last 72 hours. Urinalysis    Component Value Date/Time   COLORURINE STRAW (A) 05/14/2023 1937   APPEARANCEUR CLEAR (A) 05/14/2023 1937   APPEARANCEUR Clear 05/25/2019 1554   LABSPEC 1.003 (L) 05/14/2023 1937   LABSPEC 1.012 03/07/2015 2143   PHURINE 5.0 05/14/2023 1937   GLUCOSEU >=500 (A) 05/14/2023 1937   GLUCOSEU Negative 03/07/2015 2143   HGBUR NEGATIVE 05/14/2023 1937   BILIRUBINUR NEGATIVE 05/14/2023 1937   BILIRUBINUR Negative 05/25/2019 1554   BILIRUBINUR Negative 03/07/2015 2143   KETONESUR NEGATIVE 05/14/2023 1937   PROTEINUR NEGATIVE 05/14/2023 1937   UROBILINOGEN 0.2 03/26/2017 0907   NITRITE NEGATIVE 05/14/2023 1937   LEUKOCYTESUR NEGATIVE 05/14/2023 1937   LEUKOCYTESUR Negative 03/07/2015 2143   Sepsis Labs Recent Labs  Lab 05/14/23 1725 05/16/23 2352 05/17/23 0746  WBC 6.5 7.1 6.1   Microbiology Recent Results (from the past 240 hour(s))  Gastrointestinal Panel by PCR , Stool     Status: Abnormal   Collection Time: 05/14/23  7:23 PM   Specimen: Stool  Result Value Ref Range Status   Campylobacter species NOT DETECTED NOT DETECTED Final   Plesimonas shigelloides NOT DETECTED NOT DETECTED Final   Salmonella species NOT DETECTED NOT DETECTED Final   Yersinia enterocolitica NOT DETECTED NOT DETECTED Final   Vibrio species NOT DETECTED NOT DETECTED Final   Vibrio cholerae NOT DETECTED NOT DETECTED Final   Enteroaggregative E coli (EAEC) DETECTED (A) NOT DETECTED Final    Comment: CRITICAL RESULT CALLED TO, READ BACK BY AND VERIFIED WITH: Myra Rude RN @ 2208 05/14/23 [facility]    Enteropathogenic E coli (EPEC) NOT DETECTED NOT DETECTED Final   Enterotoxigenic E coli (ETEC) NOT DETECTED NOT DETECTED Final   Shiga like toxin producing E coli (STEC) NOT DETECTED NOT DETECTED Final    Shigella/Enteroinvasive E coli (EIEC) NOT DETECTED NOT DETECTED Final   Cryptosporidium NOT DETECTED NOT DETECTED Final   Cyclospora cayetanensis NOT DETECTED NOT DETECTED Final   Entamoeba histolytica NOT DETECTED NOT DETECTED Final   Giardia lamblia NOT DETECTED NOT DETECTED Final   Adenovirus F40/41 NOT DETECTED NOT DETECTED Final   Astrovirus NOT DETECTED NOT DETECTED Final   Norovirus GI/GII NOT DETECTED NOT DETECTED Final   Rotavirus A NOT DETECTED NOT DETECTED Final   Sapovirus (I, II, IV, and V) NOT DETECTED NOT DETECTED Final    Comment: Performed at [facility], [address] [place], [place] [address]  C Difficile Quick Screen w PCR reflex     Status: None   Collection Time: 05/14/23  7:23 PM   Specimen: Stool  Result Value Ref Range Status   C Diff antigen NEGATIVE NEGATIVE Final   C Diff toxin NEGATIVE NEGATIVE Final   C Diff interpretation No C.  difficile detected.  Final    Comment: Performed at [facility], [address] [place], [place] [address]   Imaging DG Chest 2 View  Result Date: 05/17/2023 CLINICAL DATA:  Chest pain and shortness of breath. 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 There is a stable mediastinum. There is aortic atherosclerosis and slight tortuosity. There is mild central vascular prominence with improvement. Mild interstitial edema is noted in the bases and minimal pleural effusions but both findings were greater previously. The lungs are clear of focal infiltrates. Osteopenia and degenerative change thoracic spine. Mild thoracic kyphodextroscoliosis. IMPRESSION: 1. Mild interstitial edema and minimal pleural effusions. Findings are improved from 04/27/2023. 2. 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 3. Aortic atherosclerosis. 4. No other acute chest findings. Electronically Signed   By: Almira Bar M.D.   On:  05/17/2023 01:02      Time coordinating discharge: opver 30 minutes  SIGNED:  Sunnie Nielsen DO [facility]

## 2023-05-19 ENCOUNTER — Telehealth: Payer: Self-pay | Admitting: *Deleted

## 2023-05-19 ENCOUNTER — Ambulatory Visit: Payer: Self-pay | Admitting: *Deleted

## 2023-05-19 NOTE — Patient Outreach (Signed)
  Care Coordination   Follow Up Visit Note   05/19/2023 Name: Lawrence [name] MRN: [id number] DOB: June 08, 1956  Lawrence [name] is a 67 y.o. year old male who sees [name], Demetrios Isaacs, MD for primary care.   Patient scheduled for outreach today however noted that he was readmitted to hospital 6/30-7/2 and will receive TOC call today.  This RNCM will follow up with patient next week.    SDOH assessments and interventions completed:  No     Care Coordination Interventions:  No, not indicated   Follow up plan: Follow up call scheduled for 7/8    Encounter Outcome:  Pt. Visit Completed   Kemper Durie, RN, MSN, [facility] Care Management Care Management Coordinator [phone]

## 2023-05-19 NOTE — Transitions of Care (Post Inpatient/ED Visit) (Signed)
05/19/2023  Name: Lawrence [name] MRN: [id number] DOB: [birth year]  Today's TOC FU Call Status: Today's TOC FU Call Status:: Successful TOC FU Call Competed TOC FU Call Complete Date: 05/19/23  Transition Care Management Follow-up Telephone Call Date of Discharge: 05/18/23 Discharge Facility: [facility]) Type of Discharge: Inpatient Admission Primary Inpatient Discharge Diagnosis:: chest paain How have you been since you were released from the hospital?: Better Any questions or concerns?: No  Items Reviewed: Did you receive and understand the discharge instructions provided?: Yes Medications obtained,verified, and reconciled?: Yes (Medications Reviewed) Any new allergies since your discharge?: No Dietary orders reviewed?: No Do you have support at home?: Yes People in Home: alone Name of Support/Comfort Primary Source: son russell  Medications Reviewed Today: Medications Reviewed Today     Reviewed by Luella Cook, RN (Case Manager) on 05/19/23 at 1510  Med List Status: <None>   Medication Order Taking? Sig Documenting Provider Last Dose Status Informant  Accu-Chek Softclix Lancets lancets [id number]  Use as instructed to check sugar daily for type 2 diabetes. Malva Limes, MD  Active Self, Pharmacy Records, Multiple Informants  acetaminophen (TYLENOL) 325 MG tablet [id number] Yes Take 2 tablets (650 mg total) by mouth every 4 (four) hours as needed for headache or mild pain. Rolly Salter, MD Taking Active Self, Pharmacy Records, Multiple Informants  albuterol (VENTOLIN HFA) 108 (90 Base) MCG/ACT inhaler [id number] Yes INHALE 2 PUFFS BY MOUTH EVERY 6 HOURS AS NEEDED FOR SHORTNESS OF [name], MD Taking Active Self, Pharmacy Records, Multiple Informants  allopurinol (ZYLOPRIM) 300 MG tablet [id number] Yes TAKE 1 TABLET BY MOUTH TWICE A DAY Malva Limes, MD Taking Active Self, Pharmacy Records, Multiple Informants  ALPRAZolam  Prudy Feeler) 1 MG tablet [id number] Yes Take 1 mg by mouth 3 (three) times daily as needed. [provider] Taking Active Self, Pharmacy Records, Multiple Informants  amiodarone (PACERONE) 200 MG tablet [id number] Yes Take 1 tablet (200 mg total) by mouth daily. Enedina Finner, MD Taking Active   atorvastatin (LIPITOR) 80 MG tablet [id number] Yes Take 1 tablet (80 mg total) by mouth daily. Please schedule office visit before any future refill. Malva Limes, MD Taking Active Self, Pharmacy Records, Multiple Informants  Blood Glucose Calibration (ACCU-CHEK GUIDE CONTROL) [name] [id number] Yes Use with blood glucose monitor as directed [name], Demetrios Isaacs, MD Taking Active Self, Pharmacy Records, Multiple Informants  Blood Glucose Monitoring Suppl (ACCU-CHEK GUIDE) w/Device KIT [id number] Yes Use to check blood sugars as directed Malva Limes, MD Taking Active Self, Pharmacy Records, Multiple Informants  celecoxib (CELEBREX) 200 MG capsule [id number] No Take 1 capsule (200 mg total) by mouth 2 (two) times daily as needed. Home med.  Patient not taking: Reported on 05/19/2023   [name], MD Not Taking Active Self, Pharmacy Records, Multiple Informants  cetirizine (ZYRTEC) 10 MG tablet [id number] Yes TAKE 1 TABLET BY MOUTH AT BEDTIME Malva Limes, MD Taking Active Self, Pharmacy Records, Multiple Informants  clopidogrel (PLAVIX) 75 MG tablet [id number] Yes Take 1 tablet (75 mg total) by mouth daily. Enedina Finner, MD Taking Active   dicyclomine (BENTYL) 10 MG capsule [id number] Yes Take 1 capsule (10 mg total) by mouth 3 (three) times daily as needed (abdominal pain). Phineas Semen, MD Taking Active   ELIQUIS 5 MG TABS tablet [id number] No TAKE 1 TABLET BY MOUTH TWICE A DAY  Patient not taking: Reported on 05/19/2023   Malva Limes, MD Not Taking Active Self,  Pharmacy Records, Multiple Informants           Med Note [name] Mar 28, 2023  9:02 AM)    glipiZIDE (GLUCOTROL) 5 MG tablet  [id number] Yes Take 1 tablet (5 mg total) by mouth daily as needed (sugar over 300). Malva Limes, MD Taking Active Self, Pharmacy Records, Multiple Informants  glucose blood (ACCU-CHEK GUIDE) test strip [id number] Yes Use as instructed to check sugar daily for type 2 diabetes. Malva Limes, MD Taking Active Self, Pharmacy Records, Multiple Informants  iron polysaccharides (NIFEREX) 150 MG capsule [id number] Yes Take 1 capsule (150 mg total) by mouth daily. Enedina Finner, MD Taking Active   isosorbide mononitrate (IMDUR) 120 MG 24 hr tablet [id number] Yes Take 1 tablet (120 mg total) by mouth daily. Enedina Finner, MD Taking Active   lisinopril (ZESTRIL) 20 MG tablet [id number] Yes Take 20 mg by mouth daily. [provider] Taking Active Self, Pharmacy Records, Multiple Informants  meclizine (ANTIVERT) 25 MG tablet [id number] Yes TAKE ONE TABLET BY THREE TIMES A DAY AS NEEDED FOR DIZZINESS Malva Limes, MD Taking Active Self, Pharmacy Records, Multiple Informants  metFORMIN (GLUCOPHAGE-XR) 500 MG 24 hr tablet [id number] Yes TAKE 1 TABLET BY MOUTH TWICE A DAY Malva Limes, MD Taking Active Self, Pharmacy Records, Multiple Informants  methocarbamol (ROBAXIN) 500 MG tablet [id number] Yes Take 500 mg by mouth every 8 (eight) hours as needed for muscle spasms. Take 1 tablet by mouth every hour as needed for muscle spasms Malva Limes, MD Taking Active Self, Pharmacy Records, Multiple Informants  metoprolol succinate (TOPROL-XL) 25 MG 24 hr tablet [id number] Yes Take 0.5 tablets (12.5 mg total) by mouth daily. Sunnie Nielsen, DO Taking Active   mometasone-formoterol [facility]) 100-5 MCG/ACT [name] [id number] Yes Inhale 2 puffs into the lungs 2 (two) times daily. Sunnie Nielsen, DO Taking Active   nitroGLYCERIN (NITROSTAT) 0.4 MG SL tablet [id number] Yes Place 1 tablet (0.4 mg total) under the tongue every 5 (five) minutes as needed for chest pain. Alfredia Ferguson, PA-C Taking Active Self,  Pharmacy Records, Multiple Informants  omega-3 acid ethyl esters (LOVAZA) 1 g capsule [id number] Yes Take 4 capsules (4 g total) by mouth daily. Alfredia Ferguson, PA-C Taking Active Self, Pharmacy Records, Multiple Informants  omeprazole (PRILOSEC) 40 MG capsule [id number] Yes TAKE 1 CAPSULE BY MOUTH 2 TIMES DAILY 30 MINUTES BEFORE BREAKFAST AND DINNER Malva Limes, MD Taking Active Self, Pharmacy Records, Multiple Informants  ondansetron (ZOFRAN) 4 MG tablet [id number] Yes Take 1 tablet (4 mg total) by mouth every 8 (eight) hours as needed. Phineas Semen, MD Taking Active   oxyCODONE-acetaminophen (PERCOCET) 10-325 MG tablet [id number] Yes TAKE 1 TABLET BY MOUTH EVERY 4 HOURS AS NEEDED FOR PAIN Malva Limes, MD Taking Active   pantoprazole (PROTONIX) 40 MG tablet [id number] Yes Take 1 tablet (40 mg total) by mouth daily. Enedina Finner, MD Taking Active   ranolazine (RANEXA) 1000 MG SR tablet [id number] Yes Take 1 tablet (1,000 mg total) by mouth 2 (two) times daily. Arnetha Courser, MD Taking Active Self, Pharmacy Records, Multiple Informants  torsemide [facility]) 100 MG tablet [id number] Yes Take 0.5 tablets (50 mg total) by mouth daily. Alfredia Ferguson, PA-C Taking Active Self, Pharmacy Records, Multiple Informants  traZODone (DESYREL) 150 MG tablet [id number] Yes TAKE 1 TABLET BY MOUTH AT BEDTIME Malva Limes, MD Taking Active Self, Pharmacy Records, Multiple Informants  umeclidinium bromide (INCRUSE ELLIPTA) 62.5 MCG/ACT AEPB [id number] Yes Inhale 1 puff  into the lungs daily. Sunnie Nielsen, DO Taking Active   venlafaxine XR (EFFEXOR-XR) 75 MG 24 hr capsule [id number] Yes TAKE 1 CAPSULE BY MOUTH DAILY WITH BREAKFAST [name], Demetrios Isaacs, MD Taking Active Self, Pharmacy Records, Multiple Informants            Home Care and Equipment/Supplies: Were Home Health Services Ordered?: Yes Name of Home Health Agency:: [facility] Has Agency set up a time to come to your home?: Yes [facility]  Visit Date: 05/19/23 Any new equipment or medical supplies ordered?: No  Functional Questionnaire: Do you need assistance with bathing/showering or dressing?: Yes Do you need assistance with meal preparation?: Yes Do you need assistance with eating?: No Do you have difficulty maintaining continence: No Do you need assistance with getting out of bed/getting out of a chair/moving?: No Do you have difficulty managing or taking your medications?: No  Follow up appointments reviewed: PCP Follow-up appointment confirmed?: Yes Date of PCP follow-up appointment?: 06/02/23 Follow-up Provider: Dr Sherrie Mustache [facility] Follow-up appointment confirmed?: Yes Date of Specialist follow-up appointment?: 05/21/23 Follow-Up Specialty Provider:: [id number] Dr Juliann Pares, [id number] Clarisa Kindred Do you need transportation to your follow-up appointment?: No Do you understand care options if your condition(s) worsen?: Yes-patient verbalized understanding  SDOH Interventions Today    Flowsheet Row Most Recent Value  SDOH Interventions   Food Insecurity Interventions Intervention Not Indicated  Housing Interventions Intervention Not Indicated  Transportation Interventions Intervention Not Indicated      Interventions Today    Flowsheet Row Most Recent Value  General Interventions   General Interventions Discussed/Reviewed Communication with, General Interventions Discussed, General Interventions Reviewed  [facility] Coordination Nurse Kemper Durie to reschedule appt]  Doctor Visits Discussed/Reviewed Doctor Visits Discussed, Doctor Visits Reviewed  Pharmacy Interventions   Pharmacy Dicussed/Reviewed Pharmacy Topics Discussed, Pharmacy Topics Reviewed      TOC Interventions Today    Flowsheet Row Most Recent Value  TOC Interventions   TOC Interventions Discussed/Reviewed TOC Interventions Discussed, TOC Interventions Reviewed, Arranged PCP follow up less than 12 days/Care Guide scheduled         Gean Maidens BSN RN [facility] Care Management [phone]

## 2023-05-21 DIAGNOSIS — N182 Chronic kidney disease, stage 2 (mild): Secondary | ICD-10-CM | POA: Diagnosis not present

## 2023-05-21 DIAGNOSIS — I480 Paroxysmal atrial fibrillation: Secondary | ICD-10-CM | POA: Diagnosis not present

## 2023-05-21 DIAGNOSIS — E1151 Type 2 diabetes mellitus with diabetic peripheral angiopathy without gangrene: Secondary | ICD-10-CM | POA: Diagnosis not present

## 2023-05-21 DIAGNOSIS — I5032 Chronic diastolic (congestive) heart failure: Secondary | ICD-10-CM | POA: Diagnosis not present

## 2023-05-21 DIAGNOSIS — J449 Chronic obstructive pulmonary disease, unspecified: Secondary | ICD-10-CM | POA: Diagnosis not present

## 2023-05-21 DIAGNOSIS — I25110 Atherosclerotic heart disease of native coronary artery with unstable angina pectoris: Secondary | ICD-10-CM | POA: Diagnosis not present

## 2023-05-21 DIAGNOSIS — I130 Hypertensive heart and chronic kidney disease with heart failure and stage 1 through stage 4 chronic kidney disease, or unspecified chronic kidney disease: Secondary | ICD-10-CM | POA: Diagnosis not present

## 2023-05-21 DIAGNOSIS — E1122 Type 2 diabetes mellitus with diabetic chronic kidney disease: Secondary | ICD-10-CM | POA: Diagnosis not present

## 2023-05-21 DIAGNOSIS — I5023 Acute on chronic systolic (congestive) heart failure: Secondary | ICD-10-CM | POA: Diagnosis not present

## 2023-05-24 ENCOUNTER — Encounter: Payer: Self-pay | Admitting: *Deleted

## 2023-05-24 ENCOUNTER — Telehealth: Payer: Self-pay | Admitting: Family Medicine

## 2023-05-24 DIAGNOSIS — I480 Paroxysmal atrial fibrillation: Secondary | ICD-10-CM | POA: Diagnosis not present

## 2023-05-24 DIAGNOSIS — E1151 Type 2 diabetes mellitus with diabetic peripheral angiopathy without gangrene: Secondary | ICD-10-CM | POA: Diagnosis not present

## 2023-05-24 DIAGNOSIS — I5023 Acute on chronic systolic (congestive) heart failure: Secondary | ICD-10-CM | POA: Diagnosis not present

## 2023-05-24 DIAGNOSIS — J449 Chronic obstructive pulmonary disease, unspecified: Secondary | ICD-10-CM | POA: Diagnosis not present

## 2023-05-24 DIAGNOSIS — I5032 Chronic diastolic (congestive) heart failure: Secondary | ICD-10-CM | POA: Diagnosis not present

## 2023-05-24 DIAGNOSIS — I130 Hypertensive heart and chronic kidney disease with heart failure and stage 1 through stage 4 chronic kidney disease, or unspecified chronic kidney disease: Secondary | ICD-10-CM | POA: Diagnosis not present

## 2023-05-24 DIAGNOSIS — E1122 Type 2 diabetes mellitus with diabetic chronic kidney disease: Secondary | ICD-10-CM | POA: Diagnosis not present

## 2023-05-24 DIAGNOSIS — N182 Chronic kidney disease, stage 2 (mild): Secondary | ICD-10-CM | POA: Diagnosis not present

## 2023-05-24 DIAGNOSIS — I25110 Atherosclerotic heart disease of native coronary artery with unstable angina pectoris: Secondary | ICD-10-CM | POA: Diagnosis not present

## 2023-05-24 NOTE — Telephone Encounter (Signed)
Home Health Verbal Orders - Caller/Agency: Judeth Cornfield with [facility]  Callback Number: [phone] Requesting: OT Frequency: 1X5

## 2023-05-24 NOTE — Telephone Encounter (Signed)
That's fine

## 2023-05-25 ENCOUNTER — Ambulatory Visit: Payer: Self-pay | Admitting: *Deleted

## 2023-05-25 NOTE — Patient Outreach (Signed)
  Care Coordination   05/25/2023 Name: Lawrence [name] MRN: [id number] DOB: 23-Aug-1956   Care Coordination Outreach Attempts:  An unsuccessful telephone outreach was attempted for a scheduled appointment today.  Follow Up Plan:  Additional outreach attempts will be made to offer the patient care coordination information and services.   Encounter Outcome:  No Answer   Care Coordination Interventions:  No, not indicated    Kemper Durie, RN, MSN, [facility] Care Management Care Management Coordinator [phone]

## 2023-05-25 NOTE — Telephone Encounter (Signed)
Left detailed message for Lawrence [name] advising of approved orders.

## 2023-05-27 DIAGNOSIS — I480 Paroxysmal atrial fibrillation: Secondary | ICD-10-CM | POA: Diagnosis not present

## 2023-05-27 DIAGNOSIS — I130 Hypertensive heart and chronic kidney disease with heart failure and stage 1 through stage 4 chronic kidney disease, or unspecified chronic kidney disease: Secondary | ICD-10-CM | POA: Diagnosis not present

## 2023-05-27 DIAGNOSIS — I5032 Chronic diastolic (congestive) heart failure: Secondary | ICD-10-CM | POA: Diagnosis not present

## 2023-05-27 DIAGNOSIS — E1151 Type 2 diabetes mellitus with diabetic peripheral angiopathy without gangrene: Secondary | ICD-10-CM | POA: Diagnosis not present

## 2023-05-27 DIAGNOSIS — N182 Chronic kidney disease, stage 2 (mild): Secondary | ICD-10-CM | POA: Diagnosis not present

## 2023-05-27 DIAGNOSIS — J449 Chronic obstructive pulmonary disease, unspecified: Secondary | ICD-10-CM | POA: Diagnosis not present

## 2023-05-27 DIAGNOSIS — I25110 Atherosclerotic heart disease of native coronary artery with unstable angina pectoris: Secondary | ICD-10-CM | POA: Diagnosis not present

## 2023-05-27 DIAGNOSIS — I5023 Acute on chronic systolic (congestive) heart failure: Secondary | ICD-10-CM | POA: Diagnosis not present

## 2023-05-27 DIAGNOSIS — E1122 Type 2 diabetes mellitus with diabetic chronic kidney disease: Secondary | ICD-10-CM | POA: Diagnosis not present

## 2023-05-27 NOTE — Telephone Encounter (Signed)
Called and spoke w/ pt to reschedule his appt since he was just discharged from hosp. Rescheduled pt for 7/16 @ 2PM w/ Clarisa Kindred, FNP for f/u.

## 2023-05-31 DIAGNOSIS — R0789 Other chest pain: Secondary | ICD-10-CM | POA: Insufficient documentation

## 2023-05-31 DIAGNOSIS — Z7901 Long term (current) use of anticoagulants: Secondary | ICD-10-CM | POA: Diagnosis not present

## 2023-05-31 DIAGNOSIS — I2510 Atherosclerotic heart disease of native coronary artery without angina pectoris: Secondary | ICD-10-CM | POA: Diagnosis not present

## 2023-05-31 DIAGNOSIS — G8929 Other chronic pain: Secondary | ICD-10-CM | POA: Diagnosis not present

## 2023-05-31 DIAGNOSIS — J984 Other disorders of lung: Secondary | ICD-10-CM | POA: Diagnosis not present

## 2023-05-31 DIAGNOSIS — I4891 Unspecified atrial fibrillation: Secondary | ICD-10-CM | POA: Diagnosis not present

## 2023-05-31 DIAGNOSIS — Z7902 Long term (current) use of antithrombotics/antiplatelets: Secondary | ICD-10-CM | POA: Insufficient documentation

## 2023-05-31 DIAGNOSIS — R079 Chest pain, unspecified: Secondary | ICD-10-CM | POA: Diagnosis not present

## 2023-06-01 ENCOUNTER — Encounter: Payer: Self-pay | Admitting: Family

## 2023-06-01 ENCOUNTER — Telehealth: Payer: Self-pay | Admitting: Physician Assistant

## 2023-06-01 ENCOUNTER — Encounter: Payer: Self-pay | Admitting: Emergency Medicine

## 2023-06-01 ENCOUNTER — Emergency Department: Payer: Medicare HMO

## 2023-06-01 ENCOUNTER — Emergency Department
Admission: EM | Admit: 2023-06-01 | Discharge: 2023-06-01 | Disposition: A | Discharge status: Home / Self Care | Payer: Medicare HMO | Attending: Emergency Medicine | Admitting: Emergency Medicine

## 2023-06-01 ENCOUNTER — Other Ambulatory Visit: Payer: Self-pay

## 2023-06-01 ENCOUNTER — Ambulatory Visit (HOSPITAL_BASED_OUTPATIENT_CLINIC_OR_DEPARTMENT_OTHER): Payer: Medicare HMO | Admitting: Family

## 2023-06-01 VITALS — BP 122/80 | HR 73 | Wt 193.6 lb

## 2023-06-01 DIAGNOSIS — I480 Paroxysmal atrial fibrillation: Secondary | ICD-10-CM | POA: Diagnosis not present

## 2023-06-01 DIAGNOSIS — I10 Essential (primary) hypertension: Secondary | ICD-10-CM | POA: Diagnosis not present

## 2023-06-01 DIAGNOSIS — G8929 Other chronic pain: Secondary | ICD-10-CM

## 2023-06-01 DIAGNOSIS — I25118 Atherosclerotic heart disease of native coronary artery with other forms of angina pectoris: Secondary | ICD-10-CM

## 2023-06-01 DIAGNOSIS — R079 Chest pain, unspecified: Secondary | ICD-10-CM | POA: Diagnosis not present

## 2023-06-01 DIAGNOSIS — I5032 Chronic diastolic (congestive) heart failure: Secondary | ICD-10-CM

## 2023-06-01 DIAGNOSIS — R0789 Other chest pain: Secondary | ICD-10-CM

## 2023-06-01 DIAGNOSIS — E1121 Type 2 diabetes mellitus with diabetic nephropathy: Secondary | ICD-10-CM

## 2023-06-01 DIAGNOSIS — J984 Other disorders of lung: Secondary | ICD-10-CM | POA: Diagnosis not present

## 2023-06-01 LAB — CBC
HCT: 45 % (ref 39.0–52.0)
Hemoglobin: 14.2 g/dL (ref 13.0–17.0)
MCH: 28 pg (ref 26.0–34.0)
MCHC: 31.6 g/dL (ref 30.0–36.0)
MCV: 88.8 fL (ref 80.0–100.0)
Platelets: 186 10*3/uL (ref 150–400)
RBC: 5.07 MIL/uL (ref 4.22–5.81)
RDW: 19.2 % — ABNORMAL HIGH (ref 11.5–15.5)
WBC: 8.2 10*3/uL (ref 4.0–10.5)
nRBC: 0 % (ref 0.0–0.2)

## 2023-06-01 LAB — BASIC METABOLIC PANEL
Anion gap: 10 (ref 5–15)
BUN: 19 mg/dL (ref 8–23)
CO2: 28 mmol/L (ref 22–32)
Calcium: 8.9 mg/dL (ref 8.9–10.3)
Chloride: 101 mmol/L (ref 98–111)
Creatinine, Ser: 1.39 mg/dL — ABNORMAL HIGH (ref 0.61–1.24)
GFR, Estimated: 55 mL/min — ABNORMAL LOW (ref >60)
Glucose, Bld: 169 mg/dL — ABNORMAL HIGH (ref 70–99)
Potassium: 3.3 mmol/L — ABNORMAL LOW (ref 3.5–5.1)
Sodium: 139 mmol/L (ref 135–145)

## 2023-06-01 LAB — TROPONIN I (HIGH SENSITIVITY)
Troponin I (High Sensitivity): 15 ng/L (ref <18)
Troponin I (High Sensitivity): 17 ng/L (ref <18)

## 2023-06-01 MED ORDER — OXYCODONE HCL 5 MG PO TABS
10.0000 mg | ORAL_TABLET | Freq: Once | ORAL | Status: AC
  Administered 2023-06-01: 10 mg via ORAL
  Filled 2023-06-01: qty 2

## 2023-06-01 MED ORDER — POTASSIUM CHLORIDE CRYS ER 20 MEQ PO TBCR
40.0000 meq | EXTENDED_RELEASE_TABLET | Freq: Once | ORAL | Status: AC
  Administered 2023-06-01: 40 meq via ORAL
  Filled 2023-06-01: qty 2

## 2023-06-01 NOTE — Patient Instructions (Signed)
Call Dr. Glennis Brink office to get an appointment scheduled

## 2023-06-01 NOTE — Progress Notes (Signed)
Advanced Heart Failure Clinic Note   Referring Physician: PCP: Malva Limes, MD (last seen 05/24) PCP-Cardiologist: Dorothyann Peng, MD (last seen 05/24; returns 08/24)  HPI:  Mr Casique is a 67 y/o male with a history of paroxysmal atrial fibrillation, coronary artery disease s/p CABG X2 with LIMA to the LAD and SVG to OM (2002); procedure was complicated by coronary artery dissection, asthma, osteoarthritis, hyperlipidemia, GERD, anxiety, DDD, diverticulosis, COPD, CKD essential hypertension and PSVT, type II diabetes mellitus, OSA & chronic heart failure. S/p PCI/DES to mid LAD with subsequent PTCA (2016), proximal to mid LAD stents (2014), distal LAD stent (unknown date), stent in the ostium SVG (unknown date) & circumflex/OM 1 bifucation stent (2013).   Has had 11 caths since 2014  Was in the ED 05/14/23 due to 20 episodes of diarrhea. Abdominal CT negative and negative for e coli. Antibiotics provided. Admitted 05/16/23 due to acute onset of midsternal and left-sided chest pain felt as tightness and graded 9/10 in severity with no radiation however with associated nausea as well as dyspnea and palpitations. Per Dr. Darrold Junker, likely that one of the patient's bypass grafts has occluded with his recent NSTEMI (per his review of films, unlikely to be amenable to an intervention. IV diuresed. Was in the ED earlier today 06/01/23 due to chest pain. Work up negative and he was released.   Echo 06/26/15 EF 60-65% along with mild LVH and trivial AR.  Echo 04/19/18: EF 50-55% Echo 03/08/20: EF 45-50% along with mild MR Echo 11/02/22: EF 50-55% with mild LVH, moderate LAE Echo 05/01/23: EF 50-55% with mild LVH and mild/ moderate LAE  LHC 11/02/22:   Prox LAD to Mid LAD lesion is 30% stenosed.   Mid LAD lesion is 40% stenosed.   Prox RCA lesion is 15% stenosed.   Dist RCA lesion is 25% stenosed.   1st Mrg lesion is 70% stenosed.   RPAV-2 lesion is 60% stenosed.   RPAV-1 lesion is 85% stenosed  with 85% stenosed side branch in 1st RPL.   RPDA-1 lesion is 90% stenosed.   RPDA-2 lesion is 85% stenosed.   Non-stenotic Mid LAD to Dist LAD lesion was previously treated.   Non-stenotic Prox Cx to Mid Cx lesion was previously treated.   Non-stenotic Origin to Prox Graft lesion was previously treated.   LIMA and is small.   There is mild left ventricular systolic dysfunction.   LV end diastolic pressure is mildly elevated.   The left ventricular ejection fraction is 45-50% by visual estimate.  Plan LHC from right  groin because of his persistent anginal symptoms.  Chronic angina Left ventriculogram shows mildly dilated LV with apical hypokinesis EF around 45-50 Coronaries Left main mild irregularities LAD mild to moderate disease.  Diagonal 1 Circumflex large minor irregularities occluded OM1 RCA large minor irregularities moderate disease TIMI-3 flow right dominant No significant collaterals  Grafts LIMA to mid LAD atretic SVG to OM1 widely patent  Patient cath essentially improved from previous procedure done 04/2021.  No areas of significant obstructive disease requiring intervention. Recommend aggressive medical therapy for chronic chest pain appears to mostly be noncardiac  He presents today for his initial visit with a chief complaint of moderate SOB with minimal exertion. Chronic in nature. Wears oxygen at 2L when at home and at bedtime. Has associated fatigue, chest pain, palpitations, cough, intermittent dizziness and chronic difficulty sleeping along with this. Denies pedal edema, abdominal distention or weight gain. Biggest complaint is of his chronic intermittent chest  pain.    Unable to tolerate CPAP due to feeling like he was being choked.   Off eliquis for the month of April due to hematoma in right thigh. Resumed taking this early May.   Review of Systems: [y] = yes, [ ]  = no   General: Weight gain [ ] ; Weight loss [ ] ; Anorexia [ ] ; Fatigue Cove.Etienne ]; Fever [ ] ; Chills [  ]; Weakness [ ]   Cardiac: Chest pain/pressure Cove.Etienne ]; Resting SOB [ ] ; Exertional SOB [ y]; Orthopnea [ ] ; Pedal Edema [ ] ; Palpitations [ y]; Syncope [ ] ; Presyncope [ ] ; Paroxysmal nocturnal dyspnea[ ]   Pulmonary: Cough [ y]; Wheezing[ ] ; Hemoptysis[ ] ; Sputum [ ] ; Snoring [ ]   GI: Vomiting[ ] ; Dysphagia[ ] ; Melena[ ] ; Hematochezia [ ] ; Heartburn[ ] ; Abdominal pain [ ] ; Constipation [ ] ; Diarrhea [ ] ; BRBPR [ ]   GU: Hematuria[ ] ; Dysuria [ ] ; Nocturia[ ]   Vascular: Pain in legs with walking [ ] ; Pain in feet with lying flat [ ] ; Non-healing sores [ ] ; Stroke [ ] ; TIA [ ] ; Slurred speech [ ] ;  Neuro: Headaches[ ] ; Vertigo[ ] ; Seizures[ ] ; Paresthesias[ ] ;Blurred vision [ ] ; Diplopia [ ] ; Vision changes [ ]   Ortho/Skin: Arthritis [ ] ; Joint pain [ ] ; Muscle pain [ ] ; Joint swelling [ ] ; Back Pain [ ] ; Rash [ ]   Psych: Depression[ ] ; Anxiety[ ]   Heme: Bleeding problems [ ] ; Clotting disorders [ ] ; Anemia [ ]   Endocrine: Diabetes [ y]; Thyroid dysfunction[ ]    Past Medical History:  Diagnosis Date   A-fib (HCC)    Anemia    Anginal pain (HCC)    Anxiety    Arthritis    Asthma    CAD (coronary artery disease)    a. 2002 CABGx2 (LIMA->LAD, VG->VG->OM1);  b. 09/2012 DES->OM;  c. 03/2015 PTCA of LAD [facility]) in setting of atretic LIMA; d. 05/2015 Cath [facility]): nonobs dzs; e. 06/2015 Cath ([facility]): LM nl, LAD 45p/d ISR, 50d, D1/2 small, LCX 50p/d ISR, OM1 70ost, 30 ISR, VG->OM1 50ost, 67m, LIMA->LAD 99p/d - atretic, RCA dom, nl; f.cath 10/16: 40-50%(FFR 0.90) pLAD, 75% (FFR 0.77) mLAD s/p PCI/DES, oRCA 40% (FFR0.95)   Cancer (HCC)    SKIN CANCER ON BACK   Celiac disease    Chronic diastolic CHF (congestive heart failure) (HCC)    a. 06/2009 Echo: EF 60-65%, Gr 1 DD, triv AI, mildly dil LA, nl RV.   COPD (chronic obstructive pulmonary disease) (HCC)    a. Chronic bronchitis and emphysema.   DDD (degenerative disc disease), lumbar    Diverticulosis    Dysrhythmia    Essential hypertension    GERD  (gastroesophageal reflux disease)    History of hiatal hernia    History of kidney stones    H/O   History of tobacco abuse    a. Quit 2014.   Myocardial infarction [facility]) 2002   4 STENTS   Pancreatitis    PSVT (paroxysmal supraventricular tachycardia)    a. 10/2012 Noted on Zio Patch.   Sleep apnea    LOST CORD TO CPAP -ONLY 02 @ BEDTIME   Tubular adenoma of colon    Type II diabetes mellitus (HCC)     Current Outpatient Medications  Medication Sig Dispense Refill   Accu-Chek Softclix Lancets lancets Use as instructed to check sugar daily for type 2 diabetes. 100 each 4   acetaminophen (TYLENOL) 325 MG tablet Take 2 tablets (650 mg total) by mouth every 4 (four)  hours as needed for headache or mild pain.     albuterol (VENTOLIN HFA) 108 (90 Base) MCG/ACT inhaler INHALE 2 PUFFS BY MOUTH EVERY 6 HOURS AS NEEDED FOR SHORTNESS OF BREATH 17 g 5   allopurinol (ZYLOPRIM) 300 MG tablet TAKE 1 TABLET BY MOUTH TWICE A DAY 180 tablet 4   ALPRAZolam (XANAX) 1 MG tablet Take 1 mg by mouth 3 (three) times daily as needed.     amiodarone (PACERONE) 200 MG tablet Take 1 tablet (200 mg total) by mouth daily. 30 tablet 0   atorvastatin (LIPITOR) 80 MG tablet Take 1 tablet (80 mg total) by mouth daily. Please schedule office visit before any future refill. 90 tablet 0   Blood Glucose Calibration (ACCU-CHEK GUIDE CONTROL) LIQD Use with blood glucose monitor as directed 1 each 3   Blood Glucose Monitoring Suppl (ACCU-CHEK GUIDE) w/Device KIT Use to check blood sugars as directed 1 kit 0   celecoxib (CELEBREX) 200 MG capsule Take 1 capsule (200 mg total) by mouth 2 (two) times daily as needed. Home med. (Patient not taking: Reported on 05/19/2023)     cetirizine (ZYRTEC) 10 MG tablet TAKE 1 TABLET BY MOUTH AT BEDTIME 90 tablet 1   clopidogrel (PLAVIX) 75 MG tablet Take 1 tablet (75 mg total) by mouth daily. 30 tablet 0   dicyclomine (BENTYL) 10 MG capsule Take 1 capsule (10 mg total) by mouth 3 (three) times  daily as needed (abdominal pain). 15 capsule 0   ELIQUIS 5 MG TABS tablet TAKE 1 TABLET BY MOUTH TWICE A DAY (Patient not taking: Reported on 05/19/2023) 180 tablet 3   glipiZIDE (GLUCOTROL) 5 MG tablet Take 1 tablet (5 mg total) by mouth daily as needed (sugar over 300). 30 tablet 0   glucose blood (ACCU-CHEK GUIDE) test strip Use as instructed to check sugar daily for type 2 diabetes. 100 strip 4   iron polysaccharides (NIFEREX) 150 MG capsule Take 1 capsule (150 mg total) by mouth daily. 30 capsule 1   isosorbide mononitrate (IMDUR) 120 MG 24 hr tablet Take 1 tablet (120 mg total) by mouth daily. 30 tablet 0   lisinopril (ZESTRIL) 20 MG tablet Take 20 mg by mouth daily.     meclizine (ANTIVERT) 25 MG tablet TAKE ONE TABLET BY THREE TIMES A DAY AS NEEDED FOR DIZZINESS 30 tablet 5   metFORMIN (GLUCOPHAGE-XR) 500 MG 24 hr tablet TAKE 1 TABLET BY MOUTH TWICE A DAY 60 tablet 3   methocarbamol (ROBAXIN) 500 MG tablet Take 500 mg by mouth every 8 (eight) hours as needed for muscle spasms. Take 1 tablet by mouth every hour as needed for muscle spasms     metoprolol succinate (TOPROL-XL) 25 MG 24 hr tablet Take 0.5 tablets (12.5 mg total) by mouth daily. 30 tablet 0   mometasone-formoterol (DULERA) 100-5 MCG/ACT AERO Inhale 2 puffs into the lungs 2 (two) times daily. 1 each    nitroGLYCERIN (NITROSTAT) 0.4 MG SL tablet Place 1 tablet (0.4 mg total) under the tongue every 5 (five) minutes as needed for chest pain. 30 tablet 0   omega-3 acid ethyl esters (LOVAZA) 1 g capsule Take 4 capsules (4 g total) by mouth daily. 120 capsule 3   omeprazole (PRILOSEC) 40 MG capsule TAKE 1 CAPSULE BY MOUTH 2 TIMES DAILY 30 MINUTES BEFORE BREAKFAST AND DINNER 180 capsule 4   ondansetron (ZOFRAN) 4 MG tablet Take 1 tablet (4 mg total) by mouth every 8 (eight) hours as needed. 20 tablet 0  oxyCODONE-acetaminophen (PERCOCET) 10-325 MG tablet TAKE 1 TABLET BY MOUTH EVERY 4 HOURS AS NEEDED FOR PAIN 180 tablet 0   pantoprazole  (PROTONIX) 40 MG tablet Take 1 tablet (40 mg total) by mouth daily. 30 tablet 1   ranolazine (RANEXA) 1000 MG SR tablet Take 1 tablet (1,000 mg total) by mouth 2 (two) times daily. 60 tablet 0   torsemide (DEMADEX) 100 MG tablet Take 0.5 tablets (50 mg total) by mouth daily. 30 tablet 0   traZODone (DESYREL) 150 MG tablet TAKE 1 TABLET BY MOUTH AT BEDTIME 90 tablet 4   umeclidinium bromide (INCRUSE ELLIPTA) 62.5 MCG/ACT AEPB Inhale 1 puff into the lungs daily.     venlafaxine XR (EFFEXOR-XR) 75 MG 24 hr capsule TAKE 1 CAPSULE BY MOUTH DAILY WITH BREAKFAST 90 capsule 4   No current facility-administered medications for this visit.    Allergies  Allergen Reactions   Prednisone Other (See Comments) and Hypertension    Pt states that this medication puts him in A-fib    Demerol  [Meperidine Hcl]    Demerol [Meperidine] Hives   Jardiance [Empagliflozin] Other (See Comments)    Perineal pain   Sulfa Antibiotics Hives   Albuterol Sulfate [Albuterol] Palpitations and Other (See Comments)    Pt currently uses this medication.     Morphine Sulfate Nausea And Vomiting, Rash and Other (See Comments)    Pt states that he is only allergic to the tablet form of this medication.        Social History   Socioeconomic History   Marital status: Widowed    Spouse name: Not on file   Number of children: 1   Years of education: Not on file   Highest education level: 10th grade  Occupational History   Occupation: Disabled   Occupation: on Tree surgeon  Tobacco Use   Smoking status: Former    Current packs/day: 0.00    Average packs/day: 3.0 packs/day for 50.0 years (150.0 ttl pk-yrs)    Types: Cigarettes    Start date: 04/23/1963    Quit date: 04/22/2013    Years since quitting: 10.1   Smokeless tobacco: Never   Tobacco comments:    Reports not smoking for approx 8 years.  Vaping Use   Vaping status: Never Used  Substance and Sexual Activity   Alcohol use: No    Comment: remotely quit  alcohol use. Hx of heavy alcohol use.   Drug use: Yes    Types: Marijuana    Comment: occasionally   Sexual activity: Not Currently  Other Topics Concern   Not on file  Social History Narrative   Pt lives in [place] with wife.  Does not routinely exercise.   Social Determinants of Health   Financial Resource Strain: Low Risk  (03/10/2023)   Overall Financial Resource Strain (CARDIA)    Difficulty of Paying Living Expenses: Not hard at all  Food Insecurity: No Food Insecurity (05/19/2023)   Hunger Vital Sign    Worried About Running Out of Food in the Last Year: Never true    Ran Out of Food in the Last Year: Never true  Transportation Needs: No Transportation Needs (05/19/2023)   PRAPARE - Administrator, Civil Service (Medical): No    Lack of Transportation (Non-Medical): No  Physical Activity: Inactive (03/10/2023)   Exercise Vital Sign    Days of Exercise per Week: 0 days    Minutes of Exercise per Session: 0 min  Stress: No Stress Concern  Present (03/10/2023)   [employer] of Occupational Health - Occupational Stress Questionnaire    Feeling of Stress : Not at all  Social Connections: Moderately Isolated (03/10/2023)   Social Connection and Isolation Panel [NHANES]    Frequency of Communication with Friends and Family: More than three times a week    Frequency of Social Gatherings with Friends and Family: More than three times a week    Attends Religious Services: More than 4 times per year    Active Member of [employer] or Organizations: No    Attends Banker Meetings: Not on file    Marital Status: Widowed  Intimate Partner Violence: Not At Risk (05/17/2023)   Humiliation, Afraid, Rape, and Kick questionnaire    Fear of Current or Ex-Partner: No    Emotionally Abused: No    Physically Abused: No    Sexually Abused: No      Family History  Problem Relation Age of Onset   Heart attack Mother    Depression Mother    Heart disease Mother    COPD  Mother    Hypertension Mother    Heart attack Father    Diabetes Father    Depression Father    Heart disease Father    Cirrhosis Father    Parkinson's disease Brother    Vitals:   06/01/23 1407  BP: 122/80  Pulse: 73  SpO2: 94%  Weight: 193 lb 9.6 oz (87.8 kg)   Wt Readings from Last 3 Encounters:  06/01/23 193 lb 9.6 oz (87.8 kg)  06/01/23 190 lb (86.2 kg)  05/18/23 185 lb 3 oz (84 kg)   Lab Results  Component Value Date   CREATININE 1.39 (H) 06/01/2023   CREATININE 1.03 05/18/2023   CREATININE 0.80 05/17/2023   PHYSICAL EXAM: General:  Well appearing. No respiratory difficulty HEENT: normal Neck: supple. no JVD.  No lymphadenopathy or thyromegaly appreciated. Cor: PMI nondisplaced. Regular rate , irregular rhythm. No rubs, gallops or murmurs. Lungs: clear Abdomen: soft, nontender, nondistended. No hepatosplenomegaly. No bruits or masses.  Extremities: no cyanosis, clubbing, rash, edema Neuro: alert & oriented x 3, cranial nerves grossly intact. moves all 4 extremities w/o difficulty. Affect pleasant.  ECG: 06/01/23 showed AF   ASSESSMENT & PLAN:  1: Chronic ischemic heart failure with preserved ejection fraction- - CABG in addition to PCI/DES to mid LAD with subsequent PTCA (2016), proximal to mid LAD stents (2014), distal LAD stent (unknown date), stent in the ostium SVG (unknown date) & circumflex/OM 1 bifucation stent (2013).  - NYHA class III - euvolemic - weighing daily; reminded to call for an overnight weight gain of > 2 pounds or a weekly weight gain of >5 pounds - Echo 06/26/15 EF 60-65% along with mild LVH and trivial AR.  - Echo 04/19/18: EF 50-55% - Echo 03/08/20: EF 45-50% along with mild MR - Echo 11/02/22: EF 50-55% with mild LVH, moderate LAE - Echo 05/01/23: EF 50-55% with mild LVH and mild/ moderate LAE - continue lisinopril 20mg  daily; consider transition to entresto - continue metoprolol succinate 12.5mg  daily - continue torsemide 50mg  daily -  jardiance caused perineal pain - BNP 05/16/23 was 826.8  2: HTN- - BP 122/80 - saw PCP Sherrie Mustache) 05/24 - BMP 06/01/23 showed sodium 139, potassium 3.4, creatinine 1.39 & GFR 55  3: CAD- - saw cardiology Juliann Pares) 05/24; reminded to call his office to get f/u scheduled - continue atorvastatin 80mg  daily - continue plavix 75mg  daily - continue isosorbide [place] 120mg   daily - continue ranexa 1000mg  BID - LHC 11/02/22:   Prox LAD to Mid LAD lesion is 30% stenosed.   Mid LAD lesion is 40% stenosed.   Prox RCA lesion is 15% stenosed.   Dist RCA lesion is 25% stenosed.   1st Mrg lesion is 70% stenosed.   RPAV-2 lesion is 60% stenosed.   RPAV-1 lesion is 85% stenosed with 85% stenosed side branch in 1st RPL.   RPDA-1 lesion is 90% stenosed.   RPDA-2 lesion is 85% stenosed.   Non-stenotic Mid LAD to Dist LAD lesion was previously treated.   Non-stenotic Prox Cx to Mid Cx lesion was previously treated.   Non-stenotic Origin to Prox Graft lesion was previously treated.   LIMA and is small.   There is mild left ventricular systolic dysfunction.   LV end diastolic pressure is mildly elevated.   The left ventricular ejection fraction is 45-50% by visual estimate.   4: Atrial fibrillation- - continue amiodarone 200mg  daily - continue eliquis 5mg  BID - irregular rhythm today  5: DM- - home glucose in the 130's usually, occasional 80's  Return in 6 weeks, sooner if needed  Delma Freeze, FNP 06/01/23

## 2023-06-01 NOTE — ED Provider Notes (Signed)
[facility] Provider Note    Event Date/Time   First MD Initiated Contact with Patient 06/01/23 0405     (approximate)   History   Chest Pain   HPI  Lawrence [name] is a 67 y.o. male who presents to the ED for evaluation of Chest Pain   I review of medical DC summary from 7/2.  History of A-fib, CAD, anxiety.  Admitted for CHF exacerbation and chest pain arms.  He has history of CABG with chronic angina.  Plavix and Eliquis.  Imdur and Ranexa.  Patient presents to the ED for evaluation of an episode of chest pain since about 7 or 8 PM.  Started while seated on the couch and has been persistent since then.  Pressure.  Reports his breathing is "normal", no fevers, cough, abdominal pain   Physical Exam   Triage Vital Signs: ED Triage Vitals  Encounter Vitals Group     BP 06/01/23 0001 106/66     Systolic BP Percentile --      Diastolic BP Percentile --      Pulse Rate 06/01/23 0001 86     Resp 06/01/23 0001 20     Temp 06/01/23 0001 97.8 F (36.6 C)     Temp Source 06/01/23 0001 Oral     SpO2 06/01/23 0001 93 %     Weight 06/01/23 0001 190 lb (86.2 kg)     Height 06/01/23 0001 5\' 7"  (1.702 m)     Head Circumference --      Peak Flow --      Pain Score 06/01/23 0000 9     Pain Loc --      Pain Education --      Exclude from Growth Chart --     Most recent vital signs: Vitals:   06/01/23 0220 06/01/23 0500  BP: 108/73 106/75  Pulse: 76 71  Resp: 19 18  Temp: 97.8 F (36.6 C)   SpO2: 99% 95%    General: Awake, no distress.  CV:  Good peripheral perfusion.  Resp:  Normal effort.  Abd:  No distention.  MSK:  No deformity noted.  Neuro:  No focal deficits appreciated. Other:     ED Results / Procedures / Treatments   Labs (all labs ordered are listed, but only abnormal results are displayed) Labs Reviewed  CBC - Abnormal; Notable for the following components:      Result Value   RDW 19.2 (*)    All other components within  normal limits  BASIC METABOLIC PANEL - Abnormal; Notable for the following components:   Potassium 3.3 (*)    Glucose, Bld 169 (*)    Creatinine, Ser 1.39 (*)    GFR, Estimated 55 (*)    All other components within normal limits  TROPONIN I (HIGH SENSITIVITY)  TROPONIN I (HIGH SENSITIVITY)    EKG A-fib with a rate of 83 bpm.  Normal axis and intervals.  Tremulous baseline.  No clear signs of acute ischemia.  RADIOLOGY CXR interpreted by me without evidence of acute cardiopulmonary pathology.  Official radiology report(s): DG Chest 2 View  Result Date: 06/01/2023 CLINICAL DATA:  Chest pain EXAM: CHEST - 2 VIEW COMPARISON:  05/17/2023 FINDINGS: Left basilar scarring. Lungs are otherwise clear. No pneumothorax or pleural effusion. Coronary artery bypass grafting has been performed. Cardiac size within normal limits. Pulmonary vascularity is normal. No acute bone abnormality. IMPRESSION: 1. Left basilar scarring. No active cardiopulmonary disease. Electronically Signed  By: Helyn Numbers M.D.   On: 06/01/2023 00:25    PROCEDURES and INTERVENTIONS:  .1-3 Lead EKG Interpretation  Performed by: Delton Prairie, MD Authorized by: Delton Prairie, MD     Interpretation: normal     ECG rate:  70   ECG rate assessment: normal     Rhythm: sinus rhythm     Ectopy: none     Conduction: normal     Medications  potassium chloride SA (KLOR-CON M) CR tablet 40 mEq (40 mEq Oral Given 06/01/23 0517)  oxyCODONE (Oxy IR/ROXICODONE) immediate release tablet 10 mg (10 mg Oral Given 06/01/23 0517)     IMPRESSION / MDM / ASSESSMENT AND PLAN / ED COURSE  I reviewed the triage vital signs and the nursing notes.  Differential diagnosis includes, but is not limited to, ACS, PTX, PNA, muscle strain/spasm, PE, dissection, anxiety, pleural effusion  {Patient presents with symptoms of an acute illness or injury that is potentially life-threatening.  Patient presents with chronic intermittent chest  discomfort without evidence of acute pathology and suitable for outpatient management.  EKG without concerning features and he has 2 negative troponins.  Potassium is somewhat low and this is replaced orally.  CXR is clear and normal CBC.  Pain could be related to his chronic discomfort and opiate dependence, low potassium.  No signs of CHF or volume overload.  I considered observation admission for this patient, but symptoms are improving and he looks well with benign workup.  Close follow-up tomorrow.  I suspect outpatient management is reasonable.  We discussed return precautions.  Clinical Course as of 06/01/23 0537  Tue Jun 01, 2023  0532 Reassessed, looks well. Discussed workup and plan of care. He tells me he has a routine pcp visit tomorrow. We discussed appropriate ED return precautions [DS]    Clinical Course User Index [DS] Delton Prairie, MD     FINAL CLINICAL IMPRESSION(S) / ED DIAGNOSES   Final diagnoses:  Other chest pain  Chronic chest pain     Rx / DC Orders   ED Discharge Orders     None        Note:  This document was prepared using Dragon voice recognition software and may include unintentional dictation errors.   Delton Prairie, MD 06/01/23 [phone]

## 2023-06-01 NOTE — ED Triage Notes (Signed)
EMS brings pt in from home; reports onset mid CP while lying down tonight; denies radiating pain, denies accomp symptoms

## 2023-06-01 NOTE — Telephone Encounter (Signed)
Misty Stanley Apple from [facility] called to verify the patient appointment

## 2023-06-02 ENCOUNTER — Ambulatory Visit (INDEPENDENT_AMBULATORY_CARE_PROVIDER_SITE_OTHER): Payer: Medicare HMO | Admitting: Family Medicine

## 2023-06-02 ENCOUNTER — Encounter: Payer: Self-pay | Admitting: Family Medicine

## 2023-06-02 VITALS — BP 157/92 | HR 71 | Temp 98.1°F | Resp 12 | Wt 192.1 lb

## 2023-06-02 DIAGNOSIS — J449 Chronic obstructive pulmonary disease, unspecified: Secondary | ICD-10-CM

## 2023-06-02 DIAGNOSIS — J9611 Chronic respiratory failure with hypoxia: Secondary | ICD-10-CM

## 2023-06-02 DIAGNOSIS — I25118 Atherosclerotic heart disease of native coronary artery with other forms of angina pectoris: Secondary | ICD-10-CM | POA: Diagnosis not present

## 2023-06-02 DIAGNOSIS — I5032 Chronic diastolic (congestive) heart failure: Secondary | ICD-10-CM | POA: Diagnosis not present

## 2023-06-02 DIAGNOSIS — I160 Hypertensive urgency: Secondary | ICD-10-CM

## 2023-06-02 DIAGNOSIS — I10 Essential (primary) hypertension: Secondary | ICD-10-CM

## 2023-06-02 MED ORDER — BREZTRI AEROSPHERE 160-9-4.8 MCG/ACT IN AERO: 2.0000 | INHALATION_SPRAY | Freq: Two times a day (BID) | RESPIRATORY_TRACT | 11 refills | Status: DC

## 2023-06-02 NOTE — Patient Instructions (Signed)
 .   Please review the attached list of medications and notify my office if there are any errors.   . Please bring all of your medications to every appointment so we can make sure that our medication list is the same as yours.   

## 2023-06-03 DIAGNOSIS — N182 Chronic kidney disease, stage 2 (mild): Secondary | ICD-10-CM | POA: Diagnosis not present

## 2023-06-03 DIAGNOSIS — I25110 Atherosclerotic heart disease of native coronary artery with unstable angina pectoris: Secondary | ICD-10-CM | POA: Diagnosis not present

## 2023-06-03 DIAGNOSIS — E1122 Type 2 diabetes mellitus with diabetic chronic kidney disease: Secondary | ICD-10-CM | POA: Diagnosis not present

## 2023-06-03 DIAGNOSIS — E1151 Type 2 diabetes mellitus with diabetic peripheral angiopathy without gangrene: Secondary | ICD-10-CM | POA: Diagnosis not present

## 2023-06-03 DIAGNOSIS — I5023 Acute on chronic systolic (congestive) heart failure: Secondary | ICD-10-CM | POA: Diagnosis not present

## 2023-06-03 DIAGNOSIS — I5032 Chronic diastolic (congestive) heart failure: Secondary | ICD-10-CM | POA: Diagnosis not present

## 2023-06-03 DIAGNOSIS — J449 Chronic obstructive pulmonary disease, unspecified: Secondary | ICD-10-CM | POA: Diagnosis not present

## 2023-06-03 DIAGNOSIS — I480 Paroxysmal atrial fibrillation: Secondary | ICD-10-CM | POA: Diagnosis not present

## 2023-06-03 DIAGNOSIS — I130 Hypertensive heart and chronic kidney disease with heart failure and stage 1 through stage 4 chronic kidney disease, or unspecified chronic kidney disease: Secondary | ICD-10-CM | POA: Diagnosis not present

## 2023-06-04 ENCOUNTER — Emergency Department: Payer: Medicare HMO

## 2023-06-04 ENCOUNTER — Other Ambulatory Visit: Payer: Self-pay

## 2023-06-04 DIAGNOSIS — I480 Paroxysmal atrial fibrillation: Secondary | ICD-10-CM | POA: Diagnosis not present

## 2023-06-04 DIAGNOSIS — J449 Chronic obstructive pulmonary disease, unspecified: Secondary | ICD-10-CM | POA: Diagnosis not present

## 2023-06-04 DIAGNOSIS — I130 Hypertensive heart and chronic kidney disease with heart failure and stage 1 through stage 4 chronic kidney disease, or unspecified chronic kidney disease: Secondary | ICD-10-CM | POA: Diagnosis not present

## 2023-06-04 DIAGNOSIS — N182 Chronic kidney disease, stage 2 (mild): Secondary | ICD-10-CM | POA: Diagnosis not present

## 2023-06-04 DIAGNOSIS — I25110 Atherosclerotic heart disease of native coronary artery with unstable angina pectoris: Secondary | ICD-10-CM | POA: Diagnosis not present

## 2023-06-04 DIAGNOSIS — I5032 Chronic diastolic (congestive) heart failure: Secondary | ICD-10-CM | POA: Diagnosis not present

## 2023-06-04 DIAGNOSIS — M5442 Lumbago with sciatica, left side: Secondary | ICD-10-CM | POA: Diagnosis not present

## 2023-06-04 DIAGNOSIS — M5136 Other intervertebral disc degeneration, lumbar region: Secondary | ICD-10-CM | POA: Diagnosis not present

## 2023-06-04 DIAGNOSIS — E1122 Type 2 diabetes mellitus with diabetic chronic kidney disease: Secondary | ICD-10-CM | POA: Diagnosis not present

## 2023-06-04 DIAGNOSIS — E1151 Type 2 diabetes mellitus with diabetic peripheral angiopathy without gangrene: Secondary | ICD-10-CM | POA: Diagnosis not present

## 2023-06-04 DIAGNOSIS — M549 Dorsalgia, unspecified: Secondary | ICD-10-CM | POA: Diagnosis not present

## 2023-06-04 DIAGNOSIS — M545 Low back pain, unspecified: Secondary | ICD-10-CM | POA: Diagnosis not present

## 2023-06-04 DIAGNOSIS — I5023 Acute on chronic systolic (congestive) heart failure: Secondary | ICD-10-CM | POA: Diagnosis not present

## 2023-06-04 DIAGNOSIS — M47816 Spondylosis without myelopathy or radiculopathy, lumbar region: Secondary | ICD-10-CM | POA: Diagnosis not present

## 2023-06-04 NOTE — ED Triage Notes (Signed)
Pt to ed from home via [facility] for back pain. Pt was just seen 7/16 and was prescribed oxy for his pain and was told to ice it. Pt is caox4, in no acute distress in triage.  Vitals for EMS: 148/98 78

## 2023-06-05 ENCOUNTER — Emergency Department
Admission: EM | Admit: 2023-06-05 | Discharge: 2023-06-05 | Disposition: A | Discharge status: Home / Self Care | Payer: Medicare HMO | Attending: Student in an Organized Health Care Education/Training Program | Admitting: Student in an Organized Health Care Education/Training Program

## 2023-06-05 DIAGNOSIS — M5442 Lumbago with sciatica, left side: Secondary | ICD-10-CM | POA: Diagnosis not present

## 2023-06-05 LAB — URINALYSIS, ROUTINE W REFLEX MICROSCOPIC
Bacteria, UA: NONE SEEN
Bilirubin Urine: NEGATIVE
Glucose, UA: >=500 mg/dL — AB
Hgb urine dipstick: NEGATIVE
Ketones, ur: NEGATIVE mg/dL
Leukocytes,Ua: NEGATIVE
Nitrite: NEGATIVE
Protein, ur: NEGATIVE mg/dL
Specific Gravity, Urine: 1.024 (ref 1.005–1.030)
Squamous Epithelial / HPF: NONE SEEN /HPF (ref 0–5)
pH: 5 (ref 5.0–8.0)

## 2023-06-05 MED ORDER — KETOROLAC TROMETHAMINE 30 MG/ML IJ SOLN
30.0000 mg | Freq: Once | INTRAMUSCULAR | Status: AC
  Administered 2023-06-05: 30 mg via INTRAMUSCULAR
  Filled 2023-06-05: qty 1

## 2023-06-05 MED ORDER — OXYCODONE-ACETAMINOPHEN 5-325 MG PO TABS
2.0000 | ORAL_TABLET | Freq: Once | ORAL | Status: AC
  Administered 2023-06-05: 2 via ORAL
  Filled 2023-06-05: qty 2

## 2023-06-05 MED ORDER — LIDOCAINE 5 % EX PTCH
1.0000 | MEDICATED_PATCH | CUTANEOUS | Status: DC
  Administered 2023-06-05: 1 via TRANSDERMAL
  Filled 2023-06-05: qty 1

## 2023-06-05 NOTE — ED Provider Notes (Signed)
[facility] Provider Note    Event Date/Time   First MD Initiated Contact with Patient 06/05/23 [phone]     (approximate)   History   Back Pain (chronic)   HPI  Lawrence [name] is a 67 y.o. male extensive past medical history presents to the ER for evaluation of "chronic low back pain.  Patient states he has had this for many years will have intermittent flares of it going down his left leg.  He is on chronic narcotic medication.  Took 1 of those this evening without much improvement.  Denies any new associated symptoms.  No abdominal pain.  No dysuria.  Just had CT imaging without any evidence of trauma or AAA.  Denies any interval trauma since that imaging.     Physical Exam   Triage Vital Signs: ED Triage Vitals  Encounter Vitals Group     BP 06/04/23 2310 (!) 160/84     Systolic BP Percentile --      Diastolic BP Percentile --      Pulse Rate 06/04/23 2310 75     Resp 06/04/23 2310 16     Temp 06/04/23 2310 98 F (36.7 C)     Temp Source 06/04/23 2310 Oral     SpO2 06/04/23 2310 95 %     Weight 06/04/23 2308 191 lb 12.8 oz (87 kg)     Height 06/04/23 2308 5\' 7"  (1.702 m)     Head Circumference --      Peak Flow --      Pain Score 06/04/23 2308 10     Pain Loc --      Pain Education --      Exclude from Growth Chart --     Most recent vital signs: Vitals:   06/04/23 2310  BP: (!) 160/84  Pulse: 75  Resp: 16  Temp: 98 F (36.7 C)  SpO2: 95%     Constitutional: Alert  Eyes: Conjunctivae are normal.  Head: Atraumatic. Nose: No congestion/rhinnorhea. Mouth/Throat: Mucous membranes are moist.   Neck: Painless ROM.  Cardiovascular:   Good peripheral circulation. Respiratory: Normal respiratory effort.  No retractions.  Gastrointestinal: Soft and nontender.  Musculoskeletal:  no deformity Neurologic:  MAE spontaneously. No gross focal neurologic deficits are appreciated.  Skin:  Skin is warm, dry and intact. No rash  noted. Psychiatric: Mood and affect are normal. Speech and behavior are normal.    ED Results / Procedures / Treatments   Labs (all labs ordered are listed, but only abnormal results are displayed) Labs Reviewed  URINALYSIS, ROUTINE W REFLEX MICROSCOPIC     EKG     RADIOLOGY Please see ED Course for my review and interpretation.  I personally reviewed all radiographic images ordered to evaluate for the above acute complaints and reviewed radiology reports and findings.  These findings were personally discussed with the patient.  Please see medical record for radiology report.    PROCEDURES:  Critical Care performed: No  Procedures   MEDICATIONS ORDERED IN ED: Medications  lidocaine (LIDODERM) 5 % 1 patch (1 patch Transdermal Patch Applied 06/05/23 0115)  ketorolac (TORADOL) 30 MG/ML injection 30 mg (30 mg Intramuscular Given 06/05/23 0115)     IMPRESSION / MDM / ASSESSMENT AND PLAN / ED COURSE  I reviewed the triage vital signs and the nursing notes.  Differential diagnosis includes, but is not limited to, lumbago, sciatica, stone, UTI, musculoskeletal strain, cauda equina, spinal stenosis  No history of injury or trauma. No recent back instrumentation/procedures. No fevers. Denies cord compression symptoms. No bowel/bladder incontinence or retention, no LE weakness. VSS in ED. Exam with no LE weakness bilat., no sensory deficits, normal DTRs, no clonus, no saddle anesthesia. Pain with palpation of back and with trunk movement. History and physical exam less consistent with kidney stone or pyelonephritis. Treatments will include observation, analgesia, and arrange appropriate follow up for recheck.  Clinical picture is not consistent with epidural abscess , fracture, or cauda equina syndrome. Plan supportive care, follow up for recheck        FINAL CLINICAL IMPRESSION(S) / ED DIAGNOSES   Final diagnoses:  Acute left-sided low back  pain with left-sided sciatica     Rx / DC Orders   ED Discharge Orders     None        Note:  This document was prepared using Dragon voice recognition software and may include unintentional dictation errors.    Willy Eddy, MD 06/05/23 [phone]

## 2023-06-07 ENCOUNTER — Emergency Department: Payer: Medicare HMO

## 2023-06-07 ENCOUNTER — Encounter: Payer: Self-pay | Admitting: Emergency Medicine

## 2023-06-07 ENCOUNTER — Other Ambulatory Visit: Payer: Self-pay

## 2023-06-07 ENCOUNTER — Emergency Department
Admission: EM | Admit: 2023-06-07 | Discharge: 2023-06-07 | Disposition: A | Discharge status: Home / Self Care | Payer: Medicare HMO | Attending: Emergency Medicine | Admitting: Emergency Medicine

## 2023-06-07 DIAGNOSIS — M545 Low back pain, unspecified: Secondary | ICD-10-CM | POA: Insufficient documentation

## 2023-06-07 DIAGNOSIS — R0789 Other chest pain: Secondary | ICD-10-CM | POA: Diagnosis not present

## 2023-06-07 DIAGNOSIS — N182 Chronic kidney disease, stage 2 (mild): Secondary | ICD-10-CM | POA: Diagnosis not present

## 2023-06-07 DIAGNOSIS — J4489 Other specified chronic obstructive pulmonary disease: Secondary | ICD-10-CM | POA: Diagnosis not present

## 2023-06-07 DIAGNOSIS — W19XXXA Unspecified fall, initial encounter: Secondary | ICD-10-CM

## 2023-06-07 DIAGNOSIS — M4316 Spondylolisthesis, lumbar region: Secondary | ICD-10-CM | POA: Diagnosis not present

## 2023-06-07 DIAGNOSIS — W010XXA Fall on same level from slipping, tripping and stumbling without subsequent striking against object, initial encounter: Secondary | ICD-10-CM | POA: Diagnosis not present

## 2023-06-07 DIAGNOSIS — M47814 Spondylosis without myelopathy or radiculopathy, thoracic region: Secondary | ICD-10-CM | POA: Diagnosis not present

## 2023-06-07 DIAGNOSIS — M549 Dorsalgia, unspecified: Secondary | ICD-10-CM | POA: Diagnosis not present

## 2023-06-07 DIAGNOSIS — M5136 Other intervertebral disc degeneration, lumbar region: Secondary | ICD-10-CM | POA: Diagnosis not present

## 2023-06-07 DIAGNOSIS — E1151 Type 2 diabetes mellitus with diabetic peripheral angiopathy without gangrene: Secondary | ICD-10-CM | POA: Diagnosis not present

## 2023-06-07 DIAGNOSIS — E1122 Type 2 diabetes mellitus with diabetic chronic kidney disease: Secondary | ICD-10-CM | POA: Diagnosis not present

## 2023-06-07 DIAGNOSIS — I10 Essential (primary) hypertension: Secondary | ICD-10-CM | POA: Diagnosis not present

## 2023-06-07 DIAGNOSIS — I480 Paroxysmal atrial fibrillation: Secondary | ICD-10-CM | POA: Diagnosis not present

## 2023-06-07 DIAGNOSIS — I130 Hypertensive heart and chronic kidney disease with heart failure and stage 1 through stage 4 chronic kidney disease, or unspecified chronic kidney disease: Secondary | ICD-10-CM | POA: Diagnosis not present

## 2023-06-07 DIAGNOSIS — I5023 Acute on chronic systolic (congestive) heart failure: Secondary | ICD-10-CM | POA: Diagnosis not present

## 2023-06-07 DIAGNOSIS — I5033 Acute on chronic diastolic (congestive) heart failure: Secondary | ICD-10-CM | POA: Diagnosis not present

## 2023-06-07 DIAGNOSIS — R110 Nausea: Secondary | ICD-10-CM | POA: Diagnosis not present

## 2023-06-07 DIAGNOSIS — I25110 Atherosclerotic heart disease of native coronary artery with unstable angina pectoris: Secondary | ICD-10-CM | POA: Diagnosis not present

## 2023-06-07 NOTE — ED Provider Notes (Signed)
[facility] Provider Note  Patient Contact: 9:29 PM (approximate)   History   Fall   HPI  Lawrence [name] is a 67 y.o. male presents to the emergency department with low back pain after patient had a mechanical fall tonight.  Patient reports that his feet came out from underneath of him.  He localizes his pain primarily to the lumbar spine.  He denies bowel or bladder incontinence or saddle anesthesia.  He takes oxycodone chronically for pain.      Physical Exam   Triage Vital Signs: ED Triage Vitals [06/07/23 2033]  Encounter Vitals Group     BP (!) 150/77     Systolic BP Percentile      Diastolic BP Percentile      Pulse Rate 66     Resp 20     Temp 98.3 F (36.8 C)     Temp Source Oral     SpO2 100 %     Weight      Height      Head Circumference      Peak Flow      Pain Score 7     Pain Loc      Pain Education      Exclude from Growth Chart     Most recent vital signs: Vitals:   06/07/23 2033  BP: (!) 150/77  Pulse: 66  Resp: 20  Temp: 98.3 F (36.8 C)  SpO2: 100%     General: Alert and in no acute distress. Eyes:  PERRL. EOMI. Head: No acute traumatic findings ENT:      Nose: No congestion/rhinnorhea.      Mouth/Throat: Mucous membranes are moist. Neck: No stridor. No cervical spine tenderness to palpation. Cardiovascular:  Good peripheral perfusion Respiratory: Normal respiratory effort without tachypnea or retractions. Lungs CTAB. Good air entry to the bases with no decreased or absent breath sounds. Gastrointestinal: Bowel sounds 4 quadrants. Soft and nontender to palpation. No guarding or rigidity. No palpable masses. No distention. No CVA tenderness. Musculoskeletal: Full range of motion to all extremities. Patient has midline lumbar spine tenderness.  Neurologic:  No gross focal neurologic deficits are appreciated.  Skin:   No rash noted Other:   ED Results / Procedures / Treatments   Labs (all labs ordered  are listed, but only abnormal results are displayed) Labs Reviewed - No data to display     RADIOLOGY  I personally viewed and evaluated these images as part of my medical decision making, as well as reviewing the written report by the radiologist.  ED Provider Interpretation: X-rays of the thoracic and lumbar spine are unremarkable.   PROCEDURES:  Critical Care performed: No  Procedures   MEDICATIONS ORDERED IN ED: Medications - No data to display   IMPRESSION / MDM / ASSESSMENT AND PLAN / ED COURSE  I reviewed the triage vital signs and the nursing notes.                              Assessment and plan:  Fall:   67 year old male presents to the emergency department after mechanical fall.  X-rays of the thoracic and lumbar spine are unremarkable.  I recommended continuing to take Percocet as prescribed patient chronically.  Return precautions were given to return with new or worsening symptoms.  All patient questions were answered.   FINAL CLINICAL IMPRESSION(S) / ED DIAGNOSES   Final diagnoses:  Fall,  initial encounter     Rx / DC Orders   ED Discharge Orders     None        Note:  This document was prepared using Dragon voice recognition software and may include unintentional dictation errors.   Pia Mau [place], PA-C 06/07/23 2301    Jene Every, MD 06/10/23 [phone]

## 2023-06-07 NOTE — Progress Notes (Unsigned)
Celso Amy, PA-C [address]  [address]  [place], [place] [address]  Main: [phone]  Fax: [phone]   Gastroenterology Consultation  Referring Provider:     Malva Limes, MD Primary Care Physician:  Malva Limes, MD Primary Gastroenterologist:  Celso Amy, PA-C / Dr. Lannette Donath   Reason for Consultation:     Hospital Followup IDA        HPI:   Lawrence [name] is a 67 y.o. y/o male referred for consultation & management  by Malva Limes, MD.    Here for Hospital F/U symptomatic Iron Deficiency Anemia.  Hospitalized 6/11 until 05/04/23 for Anemia and weakness.  Med hx of Afib (takes Eliquis), CAD s/p STEMI, CHF, COPD, Diabetes.  Lab 04/27/23 showed Mild Iron Deficiency Anemia with Hgb 11.7g, Iron 22, Iron Sat 7%.  Normal B12 and folate. Last lab 06/01/23 showed Improved Hgb 14.2g.  EGD done 05/01/23 by Dr. Allegra Lai showed mild gastritis, otherwise normal.  Gastric biopsies positive for H. Pylori (few organisms).  Colonoscopy 05/01/23 showed poor prep.  No polyps or biopsies.  Capsule Endoscopy 05/01/23 showed possible 2 small AVM's in proximal to mid small bowel.  It was recommend to schedule push enteroscopy as outpatient.  Current Symptoms: Supraumbilical abdominal pain, Constipation, GERD.  Ran out of PPI and needs refill.  He has not taken treatment for H. pylori.  Curently on iron tablet once daily.  Stools are brown and dark.  He has not had any bright red rectal bleeding or melena.  Currently having bowel movement every 2 or 3 days with hard stools.  Abdomen is obese, and swollen.  He is having moderate shortness of breath.  Currently on oxygen.  He cannot carry portable oxygen.  Walks with a walker.  Very deconditioned.  CT Abd / Pelvis 05/14/23: IMPRESSION: 1. Distal esophageal and gastroesophageal junction wall thickening. Underlying malignancy or ulceration with inflammation not excluded. Recommend direct visualization. 2. Colonic diverticulosis  with no acute diverticulitis. 3. Tiny left fat containing inguinal hernia. 4.  Aortic Atherosclerosis (ICD10-I70.0).  Past Medical History:  Diagnosis Date   A-fib (HCC)    Anemia    Anginal pain (HCC)    Anxiety    Arthritis    Asthma    CAD (coronary artery disease)    a. 2002 CABGx2 (LIMA->LAD, VG->VG->OM1);  b. 09/2012 DES->OM;  c. 03/2015 PTCA of LAD [facility]) in setting of atretic LIMA; d. 05/2015 Cath [facility]): nonobs dzs; e. 06/2015 Cath ([facility]): LM nl, LAD 45p/d ISR, 50d, D1/2 small, LCX 50p/d ISR, OM1 70ost, 30 ISR, VG->OM1 50ost, 32m, LIMA->LAD 99p/d - atretic, RCA dom, nl; f.cath 10/16: 40-50%(FFR 0.90) pLAD, 75% (FFR 0.77) mLAD s/p PCI/DES, oRCA 40% (FFR0.95)   Cancer (HCC)    SKIN CANCER ON BACK   Celiac disease    Chronic diastolic CHF (congestive heart failure) (HCC)    a. 06/2009 Echo: EF 60-65%, Gr 1 DD, triv AI, mildly dil LA, nl RV.   COPD (chronic obstructive pulmonary disease) (HCC)    a. Chronic bronchitis and emphysema.   DDD (degenerative disc disease), lumbar    Diverticulosis    Dysrhythmia    Essential hypertension    GERD (gastroesophageal reflux disease)    History of hiatal hernia    History of kidney stones    H/O   History of tobacco abuse    a. Quit 2014.   Myocardial infarction [facility]) 2002   4 STENTS   Pancreatitis  PSVT (paroxysmal supraventricular tachycardia)    a. 10/2012 Noted on Zio Patch.   Sleep apnea    LOST CORD TO CPAP -ONLY 02 @ BEDTIME   Tubular adenoma of colon    Type II diabetes mellitus (HCC)     Past Surgical History:  Procedure Laterality Date   BYPASS GRAFT     CARDIAC CATHETERIZATION N/A 07/12/2015   rocedure: Left Heart Cath and Cors/Grafts Angiography;  Surgeon: Lyn Records, MD;  Location: [facility] INVASIVE CV LAB;  Service: Cardiovascular;  Laterality: N/A;   CARDIAC CATHETERIZATION Right 10/07/2015   Procedure: Left Heart Cath and Cors/Grafts Angiography;  Surgeon: Laurier Nancy, MD;  Location: [facility] INVASIVE CV LAB;   Service: Cardiovascular;  Laterality: Right;   CARDIAC CATHETERIZATION N/A 04/06/2016   Procedure: Left Heart Cath and Coronary Angiography;  Surgeon: Alwyn Pea, MD;  Location: [facility] INVASIVE CV LAB;  Service: Cardiovascular;  Laterality: N/A;   CARDIAC CATHETERIZATION  04/06/2016   Procedure: Bypass Graft Angiography;  Surgeon: Alwyn Pea, MD;  Location: [facility] INVASIVE CV LAB;  Service: Cardiovascular;;   CARDIAC CATHETERIZATION N/A 11/02/2016   Procedure: Left Heart Cath and Cors/Grafts Angiography and possible PCI;  Surgeon: Alwyn Pea, MD;  Location: [facility] INVASIVE CV LAB;  Service: Cardiovascular;  Laterality: N/A;   CARDIAC CATHETERIZATION N/A 11/02/2016   Procedure: Coronary Stent Intervention;  Surgeon: Alwyn Pea, MD;  Location: [facility] INVASIVE CV LAB;  Service: Cardiovascular;  Laterality: N/A;   CARDIOVERSION N/A 06/29/2022   Procedure: CARDIOVERSION;  Surgeon: Lamar Blinks, MD;  Location: [facility] ORS;  Service: Cardiovascular;  Laterality: N/A;   CHOLECYSTECTOMY     CIRCUMCISION N/A 06/09/2019   Procedure: CIRCUMCISION ADULT;  Surgeon: Sondra Come, MD;  Location: [facility] ORS;  Service: Urology;  Laterality: N/A;   COLONOSCOPY WITH PROPOFOL N/A 04/01/2018   Procedure: COLONOSCOPY WITH PROPOFOL;  Surgeon: Scot Jun, MD;  Location: [facility] ENDOSCOPY;  Service: Endoscopy;  Laterality: N/A;   COLONOSCOPY WITH PROPOFOL N/A 05/01/2023   Procedure: COLONOSCOPY WITH PROPOFOL;  Surgeon: Toney Reil, MD;  Location: [facility] ENDOSCOPY;  Service: Gastroenterology;  Laterality: N/A;   ESOPHAGEAL DILATION     ESOPHAGOGASTRODUODENOSCOPY (EGD) WITH PROPOFOL N/A 04/01/2018   Procedure: ESOPHAGOGASTRODUODENOSCOPY (EGD) WITH PROPOFOL;  Surgeon: Scot Jun, MD;  Location: [facility] ENDOSCOPY;  Service: Endoscopy;  Laterality: N/A;   ESOPHAGOGASTRODUODENOSCOPY (EGD) WITH PROPOFOL N/A 05/01/2023   Procedure: ESOPHAGOGASTRODUODENOSCOPY (EGD) WITH PROPOFOL;  Surgeon: Toney Reil, MD;  Location: [facility] ENDOSCOPY;  Service: Gastroenterology;  Laterality: N/A;   GIVENS CAPSULE STUDY  05/01/2023   Procedure: GIVENS CAPSULE STUDY;  Surgeon: Toney Reil, MD;  Location: [facility] ENDOSCOPY;  Service: Gastroenterology;;   LEFT HEART CATH AND CORS/GRAFTS ANGIOGRAPHY N/A 06/12/2019   Procedure: LEFT HEART CATH AND CORS/GRAFTS ANGIOGRAPHY;  Surgeon: Dalia Heading, MD;  Location: [facility] INVASIVE CV LAB;  Service: Cardiovascular;  Laterality: N/A;   LEFT HEART CATH AND CORS/GRAFTS ANGIOGRAPHY N/A 03/11/2020   Procedure: LEFT HEART CATH AND CORS/GRAFTS ANGIOGRAPHY;  Surgeon: Marcina Millard, MD;  Location: [facility] INVASIVE CV LAB;  Service: Cardiovascular;  Laterality: N/A;   LEFT HEART CATH AND CORS/GRAFTS ANGIOGRAPHY N/A 05/01/2021   Procedure: LEFT HEART CATH AND CORS/GRAFTS ANGIOGRAPHY;  Surgeon: Lamar Blinks, MD;  Location: [facility] INVASIVE CV LAB;  Service: Cardiovascular;  Laterality: N/A;   LEFT HEART CATH AND CORS/GRAFTS ANGIOGRAPHY N/A 11/02/2022   Procedure: LEFT HEART CATH AND CORS/GRAFTS ANGIOGRAPHY;  Surgeon: Alwyn Pea, MD;  Location: [facility] INVASIVE  CV LAB;  Service: Cardiovascular;  Laterality: N/A;   TONSILLECTOMY     VASCULAR SURGERY      Prior to Admission medications   Medication Sig Start Date End Date Taking? Authorizing Provider  Accu-Chek Softclix Lancets lancets Use as instructed to check sugar daily for type 2 diabetes. 03/03/21   Malva Limes, MD  acetaminophen (TYLENOL) 325 MG tablet Take 2 tablets (650 mg total) by mouth every 4 (four) hours as needed for headache or mild pain. 08/07/22   Rolly Salter, MD  albuterol (VENTOLIN HFA) 108 (90 Base) MCG/ACT inhaler INHALE 2 PUFFS BY MOUTH EVERY 6 HOURS AS NEEDED FOR SHORTNESS OF BREATH 12/02/21   Malva Limes, MD  allopurinol (ZYLOPRIM) 300 MG tablet TAKE 1 TABLET BY MOUTH TWICE A DAY 03/13/23   Malva Limes, MD  ALPRAZolam Prudy Feeler) 1 MG tablet Take 1 mg by mouth 3 (three) times  daily as needed. 04/19/23   [provider]  amiodarone (PACERONE) 200 MG tablet Take 1 tablet (200 mg total) by mouth daily. 05/04/23   Enedina Finner, MD  atorvastatin (LIPITOR) 80 MG tablet Take 1 tablet (80 mg total) by mouth daily. Please schedule office visit before any future refill. 11/19/22   Malva Limes, MD  Blood Glucose Calibration (ACCU-CHEK GUIDE CONTROL) LIQD Use with blood glucose monitor as directed 02/20/21   Malva Limes, MD  Blood Glucose Monitoring Suppl (ACCU-CHEK GUIDE) w/Device KIT Use to check blood sugars as directed 02/20/21   Malva Limes, MD  Budeson-Glycopyrrol-Formoterol (BREZTRI AEROSPHERE) 160-9-4.8 MCG/ACT AERO Inhale 2 puffs into the lungs 2 (two) times daily. 06/02/23   Malva Limes, MD  celecoxib (CELEBREX) 200 MG capsule Take 1 capsule (200 mg total) by mouth 2 (two) times daily as needed. Home med. 11/03/22   [name], MD  cetirizine (ZYRTEC) 10 MG tablet TAKE 1 TABLET BY MOUTH AT BEDTIME 03/12/23   Malva Limes, MD  clopidogrel (PLAVIX) 75 MG tablet Take 1 tablet (75 mg total) by mouth daily. 05/04/23   Enedina Finner, MD  dicyclomine (BENTYL) 10 MG capsule Take 1 capsule (10 mg total) by mouth 3 (three) times daily as needed (abdominal pain). 05/14/23   Phineas Semen, MD  ELIQUIS 5 MG TABS tablet TAKE 1 TABLET BY MOUTH TWICE A DAY 07/30/22   Malva Limes, MD  FARXIGA 10 MG TABS tablet Take 10 mg by mouth daily. 05/25/23   [provider]  glipiZIDE (GLUCOTROL) 5 MG tablet Take 1 tablet (5 mg total) by mouth daily as needed (sugar over 300). 03/16/23   Malva Limes, MD  glucose blood (ACCU-CHEK GUIDE) test strip Use as instructed to check sugar daily for type 2 diabetes. 03/03/21   Malva Limes, MD  iron polysaccharides (NIFEREX) 150 MG capsule Take 1 capsule (150 mg total) by mouth daily. 05/04/23   Enedina Finner, MD  isosorbide mononitrate (IMDUR) 120 MG 24 hr tablet Take 1 tablet (120 mg total) by mouth daily. 05/04/23   Enedina Finner, MD  lisinopril (ZESTRIL) 20 MG tablet Take 20 mg by mouth daily. 03/17/23   [provider]  meclizine (ANTIVERT) 25 MG tablet TAKE ONE TABLET BY THREE TIMES A DAY AS NEEDED FOR DIZZINESS 08/13/22   Malva Limes, MD  metFORMIN (GLUCOPHAGE-XR) 500 MG 24 hr tablet TAKE 1 TABLET BY MOUTH TWICE A DAY 10/05/22   Malva Limes, MD  methocarbamol (ROBAXIN) 500 MG tablet Take 500 mg by mouth every  8 (eight) hours as needed for muscle spasms. Take 1 tablet by mouth every hour as needed for muscle spasms    [name], Demetrios Isaacs, MD  metoprolol succinate (TOPROL-XL) 25 MG 24 hr tablet Take 0.5 tablets (12.5 mg total) by mouth daily. 05/19/23   Sunnie Nielsen, DO  nitroGLYCERIN (NITROSTAT) 0.4 MG SL tablet Place 1 tablet (0.4 mg total) under the tongue every 5 (five) minutes as needed for chest pain. 09/10/22   Alfredia Ferguson, PA-C  omega-3 acid ethyl esters (LOVAZA) 1 g capsule Take 4 capsules (4 g total) by mouth daily. 09/10/22   Drubel, Lillia Abed, PA-C  ondansetron (ZOFRAN) 4 MG tablet Take 1 tablet (4 mg total) by mouth every 8 (eight) hours as needed. 05/14/23   Phineas Semen, MD  oxyCODONE-acetaminophen (PERCOCET) 10-325 MG tablet TAKE 1 TABLET BY MOUTH EVERY 4 HOURS AS NEEDED FOR PAIN 05/18/23   Malva Limes, MD  pantoprazole (PROTONIX) 40 MG tablet Take 1 tablet (40 mg total) by mouth daily. 05/04/23   Enedina Finner, MD  ranolazine (RANEXA) 1000 MG SR tablet Take 1 tablet (1,000 mg total) by mouth 2 (two) times daily. 02/22/23   Arnetha Courser, MD  torsemide (DEMADEX) 100 MG tablet Take 0.5 tablets (50 mg total) by mouth daily. 09/10/22   Alfredia Ferguson, PA-C  traZODone (DESYREL) 150 MG tablet TAKE 1 TABLET BY MOUTH AT BEDTIME 11/24/22   Malva Limes, MD  venlafaxine XR (EFFEXOR-XR) 75 MG 24 hr capsule TAKE 1 CAPSULE BY MOUTH DAILY WITH BREAKFAST 02/23/23   Malva Limes, MD    Family History  Problem Relation Age of Onset   Heart attack Mother    Depression Mother    Heart  disease Mother    COPD Mother    Hypertension Mother    Heart attack Father    Diabetes Father    Depression Father    Heart disease Father    Cirrhosis Father    Parkinson's disease Brother      Social History   Tobacco Use   Smoking status: Former    Current packs/day: 0.00    Average packs/day: 3.0 packs/day for 50.0 years (150.0 ttl pk-yrs)    Types: Cigarettes    Start date: 04/23/1963    Quit date: 04/22/2013    Years since quitting: 10.1   Smokeless tobacco: Never   Tobacco comments:    Reports not smoking for approx 8 years.  Vaping Use   Vaping status: Never Used  Substance Use Topics   Alcohol use: No    Comment: remotely quit alcohol use. Hx of heavy alcohol use.   Drug use: Yes    Types: Marijuana    Comment: occasionally    Allergies as of 06/08/2023 - Review Complete 06/08/2023  Allergen Reaction Noted   Prednisone Other (See Comments) and Hypertension 10/20/2015   Demerol  [meperidine hcl]  05/25/2019   Demerol [meperidine] Hives 03/22/2015   Jardiance [empagliflozin] Other (See Comments) 04/26/2018   Sulfa antibiotics Hives 03/22/2015   Albuterol sulfate [albuterol] Palpitations and Other (See Comments) 07/03/2015   Morphine sulfate Nausea And Vomiting, Rash, and Other (See Comments) 07/03/2015    Review of Systems:    All systems reviewed and negative except where noted in HPI.   Physical Exam:  BP (!) 176/82   Pulse 60   Temp 98.7 F (37.1 C)   Ht 5\' 7"  (1.702 m)   Wt 206 lb (93.4 kg)   BMI 32.26 kg/m  No LMP for  male patient. Psych:  Alert and cooperative. Normal mood and affect. General:   Alert,  Well-developed, well-nourished, pleasant and cooperative in NAD Head:  Normocephalic and atraumatic. Eyes:  Sclera clear, no icterus.   Conjunctiva pink. Neck:  Supple; no masses or thyromegaly. Lungs:  Respirations even and unlabored.  Clear throughout to auscultation.   No wheezes, crackles, or rhonchi. No acute distress. Heart:  Regular rate  and rhythm; no murmurs, clicks, rubs, or gallops. Abdomen:  Normal bowel sounds.  No bruits.  Soft, and non-distended without masses, hepatosplenomegaly or hernias noted.  No Tenderness.  No guarding or rebound tenderness.    Neurologic:  Alert and oriented x3;  grossly normal neurologically. Psych:  Alert and cooperative. Normal mood and affect.  Imaging Studies: DG Lumbar Spine 2-3 Views  Result Date: 06/07/2023 CLINICAL DATA:  Fall, back pain EXAM: LUMBAR SPINE - 2-3 VIEW COMPARISON:  None Available. FINDINGS: Degenerative facet disease in the mid to lower lumbar spine. Slight anterolisthesis of L4 on L5. Disc spaces maintained. No fracture. SI joints symmetric and unremarkable. 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 No acute bony abnormality. Electronically Signed   By: Charlett Nose M.D.   On: 06/07/2023 22:13   DG Thoracic Spine 2 View  Result Date: 06/07/2023 CLINICAL DATA:  Back pain EXAM: THORACIC SPINE 2 VIEWS COMPARISON:  Chest x-ray 02/21/2023 FINDINGS: Thoracic alignment within normal limits. Moderate mid to lower thoracic degenerative osteophyte. Stable vertebral body heights. IMPRESSION: Moderate degenerative changes. Electronically Signed   By: Jasmine Pang M.D.   On: 06/07/2023 21:18   DG Lumbar Spine 2-3 Views  Result Date: 06/04/2023 CLINICAL DATA:  Pain. Back pain. Seen on 07/16 for back pain and was prescribed OxyContin and told to ice it. EXAM: LUMBAR SPINE - 2-3 VIEW COMPARISON:  Lumbar spine radiographs 03/13/2023. CT abdomen and pelvis 05/14/2023 FINDINGS: Five lumbar type vertebral bodies. Normal alignment of the lumbar spine. No vertebral compression deformities. No focal bone lesion or bone destruction. Degenerative changes with mild disc space narrowing and small osteophyte formation throughout. Visualized sacrum appears intact. Vascular calcifications. Surgical clips in the right upper quadrant. IMPRESSION: Mild degenerative changes  in the lumbar spine. Normal alignment. No acute displaced fractures identified. Electronically Signed   By: Burman Nieves M.D.   On: 06/04/2023 23:49   DG Chest 2 View  Result Date: 06/01/2023 CLINICAL DATA:  Chest pain EXAM: CHEST - 2 VIEW COMPARISON:  05/17/2023 FINDINGS: Left basilar scarring. Lungs are otherwise clear. No pneumothorax or pleural effusion. Coronary artery bypass grafting has been performed. Cardiac size within normal limits. Pulmonary vascularity is normal. No acute bone abnormality. IMPRESSION: 1. Left basilar scarring. No active cardiopulmonary disease. Electronically Signed   By: Helyn Numbers M.D.   On: 06/01/2023 00:25   DG Chest 2 View  Result Date: 05/17/2023 CLINICAL DATA:  Chest pain and shortness of breath. 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 There is a stable mediastinum. There is aortic atherosclerosis and slight tortuosity. There is mild central vascular prominence with improvement. Mild interstitial edema is noted in the bases and minimal pleural effusions but both findings were greater previously. The lungs are clear of focal infiltrates. Osteopenia and degenerative change thoracic spine. Mild thoracic kyphodextroscoliosis. IMPRESSION: 1. Mild interstitial edema and minimal pleural effusions. Findings are improved from 04/27/2023. 2. 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  3. Aortic atherosclerosis. 4. No other acute chest findings. Electronically Signed   By: Almira Bar M.D.   On: 05/17/2023 01:02   CT ABDOMEN PELVIS W CONTRAST  Result Date: 05/14/2023 CLINICAL DATA:  abdominal pain Pt presents to ED with c/o of diarrhea, pt states HX of ulcers, pt states recently admitted for same. Pt states possible blood in stool. EXAM: CT ABDOMEN AND PELVIS WITH CONTRAST TECHNIQUE: Multidetector CT imaging of the abdomen and pelvis was performed using the  standard protocol following bolus administration of intravenous contrast. RADIATION DOSE REDUCTION: This exam was performed according to the departmental dose-optimization program which includes automated exposure control, adjustment of the mA and/or kV according to patient size and/or use of iterative reconstruction technique. CONTRAST:  OMNIPAQUE IOHEXOL 300 MG/ML  SOLN COMPARISON:  CT abdomen pelvis 04/27/2023 FINDINGS: Lower chest: Diffuse bronchial wall thickening. Trace left pleural effusion. Distal esophageal wall thickening. Left base atelectasis. Hepatobiliary: No focal liver abnormality. Status post cholecystectomy. No biliary dilatation. Pancreas: No focal lesion. Normal pancreatic contour. No surrounding inflammatory changes. No main pancreatic ductal dilatation. Spleen: Normal in size without focal abnormality. Adrenals/Urinary Tract: A chronic stable 1.5 cm left adrenal gland nodule with a density of 58 Hounsfield units consistent with an adrenal adenoma-no further follow-up indicated. No right adrenal gland nodule. Bilateral kidneys enhance symmetrically. No hydronephrosis. No hydroureter. The urinary bladder is unremarkable. Stomach/Bowel: Gastroesophageal junction wall thickening (2:16). No evidence of bowel wall thickening or dilatation. Colonic diverticulosis. Appendix appears normal. Vascular/Lymphatic: No abdominal aorta or iliac aneurysm. Moderate atherosclerotic plaque of the aorta and its branches. No abdominal, pelvic, or inguinal lymphadenopathy. Reproductive: Prostate is unremarkable. Other: No intraperitoneal free fluid. No intraperitoneal free gas. No organized fluid collection. Musculoskeletal: Tiny left fat containing inguinal hernia. No suspicious lytic or blastic osseous lesions. No acute displaced fracture. IMPRESSION: 1. Distal esophageal and gastroesophageal junction wall thickening. Underlying malignancy or ulceration with inflammation not excluded. Recommend direct  visualization. 2. Colonic diverticulosis with no acute diverticulitis. 3. Tiny left fat containing inguinal hernia. 4.  Aortic Atherosclerosis (ICD10-I70.0). Electronically Signed   By: Tish Frederickson M.D.   On: 05/14/2023 20:37    Assessment and Plan:   ALANZO LAMB is a 67 y.o. y/o male has been referred for:   1.  Follow-up iron deficiency anemia and GI bleed thought secondary to small bowel AVMs  Recent hemoglobin checked 06/01/2023 showed improved to 14 g.  Continue oral iron once daily.  Follow-up OV in 1 month and plan to recheck CBC and iron panel in 1 month.  2.  GERD with gastritis and esophagitis -recently ran out of PPI and needs refill  Restart pantoprazole.  3.  H. pylori gastritis  Treat with amoxicillin 1000mg  BID, Levaquin 500mg  QD, Pantoprazole 40mg  BID for 14 days.  4.  Chronic constipation  Start MiraLAX 1 capful in a drink once daily.  5.  Supraumbilical abdominal pain  Recent abdominal pelvic CT showed no abnormality to explain his pain except esophagitis.  6.  Multiple comorbidities with severe COPD and CAD.  Oxygen dependent.  Follow up 4 weeks with TG  Celso Amy, PA-C

## 2023-06-07 NOTE — ED Triage Notes (Signed)
Pt presents to triage via [facility] for back pain following a fall tonight. Pt slipped and landed on his back causing an exarbnaction of his chronic pain. He received of fentanyl and 4mg  of zofran PTA which helped his pain decreased from a 10/10 to an 8/10. Pt states his pain level is 7/10 baseline. A&Ox4 at this time. Denies hitting his head, LOC, CP or SOB.

## 2023-06-08 ENCOUNTER — Other Ambulatory Visit: Payer: Self-pay

## 2023-06-08 ENCOUNTER — Encounter: Payer: Self-pay | Admitting: Physician Assistant

## 2023-06-08 ENCOUNTER — Emergency Department: Payer: Medicare HMO

## 2023-06-08 ENCOUNTER — Emergency Department
Admission: EM | Admit: 2023-06-08 | Discharge: 2023-06-09 | Disposition: A | Discharge status: Home / Self Care | Payer: Medicare HMO | Attending: Emergency Medicine | Admitting: Emergency Medicine

## 2023-06-08 ENCOUNTER — Ambulatory Visit (INDEPENDENT_AMBULATORY_CARE_PROVIDER_SITE_OTHER): Payer: Medicare HMO | Admitting: Physician Assistant

## 2023-06-08 VITALS — BP 176/82 | HR 60 | Temp 98.7°F | Ht 67.0 in | Wt 206.0 lb

## 2023-06-08 DIAGNOSIS — N189 Chronic kidney disease, unspecified: Secondary | ICD-10-CM | POA: Insufficient documentation

## 2023-06-08 DIAGNOSIS — W19XXXA Unspecified fall, initial encounter: Secondary | ICD-10-CM | POA: Diagnosis not present

## 2023-06-08 DIAGNOSIS — I4891 Unspecified atrial fibrillation: Secondary | ICD-10-CM | POA: Diagnosis not present

## 2023-06-08 DIAGNOSIS — I10 Essential (primary) hypertension: Secondary | ICD-10-CM | POA: Diagnosis not present

## 2023-06-08 DIAGNOSIS — I509 Heart failure, unspecified: Secondary | ICD-10-CM | POA: Insufficient documentation

## 2023-06-08 DIAGNOSIS — K429 Umbilical hernia without obstruction or gangrene: Secondary | ICD-10-CM | POA: Diagnosis not present

## 2023-06-08 DIAGNOSIS — J90 Pleural effusion, not elsewhere classified: Secondary | ICD-10-CM | POA: Diagnosis not present

## 2023-06-08 DIAGNOSIS — K2100 Gastro-esophageal reflux disease with esophagitis, without bleeding: Secondary | ICD-10-CM | POA: Diagnosis not present

## 2023-06-08 DIAGNOSIS — R1013 Epigastric pain: Secondary | ICD-10-CM | POA: Diagnosis not present

## 2023-06-08 DIAGNOSIS — A048 Other specified bacterial intestinal infections: Secondary | ICD-10-CM | POA: Diagnosis not present

## 2023-06-08 DIAGNOSIS — I2510 Atherosclerotic heart disease of native coronary artery without angina pectoris: Secondary | ICD-10-CM | POA: Insufficient documentation

## 2023-06-08 DIAGNOSIS — J45909 Unspecified asthma, uncomplicated: Secondary | ICD-10-CM | POA: Insufficient documentation

## 2023-06-08 DIAGNOSIS — J811 Chronic pulmonary edema: Secondary | ICD-10-CM | POA: Diagnosis not present

## 2023-06-08 DIAGNOSIS — R0789 Other chest pain: Secondary | ICD-10-CM | POA: Diagnosis not present

## 2023-06-08 DIAGNOSIS — R079 Chest pain, unspecified: Secondary | ICD-10-CM | POA: Diagnosis not present

## 2023-06-08 DIAGNOSIS — E119 Type 2 diabetes mellitus without complications: Secondary | ICD-10-CM | POA: Diagnosis not present

## 2023-06-08 DIAGNOSIS — R61 Generalized hyperhidrosis: Secondary | ICD-10-CM | POA: Insufficient documentation

## 2023-06-08 DIAGNOSIS — R0602 Shortness of breath: Secondary | ICD-10-CM | POA: Insufficient documentation

## 2023-06-08 DIAGNOSIS — K5904 Chronic idiopathic constipation: Secondary | ICD-10-CM

## 2023-06-08 DIAGNOSIS — D500 Iron deficiency anemia secondary to blood loss (chronic): Secondary | ICD-10-CM | POA: Diagnosis not present

## 2023-06-08 DIAGNOSIS — Z955 Presence of coronary angioplasty implant and graft: Secondary | ICD-10-CM | POA: Diagnosis not present

## 2023-06-08 DIAGNOSIS — R0902 Hypoxemia: Secondary | ICD-10-CM | POA: Diagnosis not present

## 2023-06-08 DIAGNOSIS — R0989 Other specified symptoms and signs involving the circulatory and respiratory systems: Secondary | ICD-10-CM | POA: Diagnosis not present

## 2023-06-08 DIAGNOSIS — R590 Localized enlarged lymph nodes: Secondary | ICD-10-CM | POA: Diagnosis not present

## 2023-06-08 DIAGNOSIS — Z7901 Long term (current) use of anticoagulants: Secondary | ICD-10-CM | POA: Insufficient documentation

## 2023-06-08 DIAGNOSIS — N400 Enlarged prostate without lower urinary tract symptoms: Secondary | ICD-10-CM | POA: Diagnosis not present

## 2023-06-08 LAB — COMPREHENSIVE METABOLIC PANEL
ALT: 33 U/L (ref 0–44)
AST: 31 U/L (ref 15–41)
Albumin: 4.1 g/dL (ref 3.5–5.0)
Alkaline Phosphatase: 62 U/L (ref 38–126)
Anion gap: 6 (ref 5–15)
BUN: 12 mg/dL (ref 8–23)
CO2: 30 mmol/L (ref 22–32)
Calcium: 9.2 mg/dL (ref 8.9–10.3)
Chloride: 104 mmol/L (ref 98–111)
Creatinine, Ser: 0.94 mg/dL (ref 0.61–1.24)
GFR, Estimated: >60 mL/min (ref >60)
Glucose, Bld: 160 mg/dL — ABNORMAL HIGH (ref 70–99)
Potassium: 4.6 mmol/L (ref 3.5–5.1)
Sodium: 140 mmol/L (ref 135–145)
Total Bilirubin: 0.9 mg/dL (ref 0.3–1.2)
Total Protein: 6.9 g/dL (ref 6.5–8.1)

## 2023-06-08 LAB — CBC
HCT: 44.4 % (ref 39.0–52.0)
Hemoglobin: 13.8 g/dL (ref 13.0–17.0)
MCH: 27.9 pg (ref 26.0–34.0)
MCHC: 31.1 g/dL (ref 30.0–36.0)
MCV: 89.9 fL (ref 80.0–100.0)
Platelets: 165 10*3/uL (ref 150–400)
RBC: 4.94 MIL/uL (ref 4.22–5.81)
RDW: 20.2 % — ABNORMAL HIGH (ref 11.5–15.5)
WBC: 8.6 10*3/uL (ref 4.0–10.5)
nRBC: 0 % (ref 0.0–0.2)

## 2023-06-08 LAB — TROPONIN I (HIGH SENSITIVITY): Troponin I (High Sensitivity): 33 ng/L — ABNORMAL HIGH (ref <18)

## 2023-06-08 LAB — BRAIN NATRIURETIC PEPTIDE: B Natriuretic Peptide: 677.3 pg/mL — ABNORMAL HIGH (ref 0.0–100.0)

## 2023-06-08 LAB — LIPASE, BLOOD: Lipase: 29 U/L (ref 11–51)

## 2023-06-08 MED ORDER — IOHEXOL 300 MG/ML  SOLN
100.0000 mL | Freq: Once | INTRAMUSCULAR | Status: AC | PRN
  Administered 2023-06-08: 100 mL via INTRAVENOUS

## 2023-06-08 MED ORDER — FENTANYL CITRATE PF 50 MCG/ML IJ SOSY
50.0000 ug | PREFILLED_SYRINGE | Freq: Once | INTRAMUSCULAR | Status: AC
  Administered 2023-06-08: 50 ug via INTRAVENOUS
  Filled 2023-06-08: qty 1

## 2023-06-08 MED ORDER — LEVOFLOXACIN 500 MG PO TABS
500.0000 mg | ORAL_TABLET | Freq: Every day | ORAL | 0 refills | Status: AC
Start: 2023-06-08 — End: 2023-06-18

## 2023-06-08 MED ORDER — AMOXICILLIN 500 MG PO TABS
1000.0000 mg | ORAL_TABLET | Freq: Two times a day (BID) | ORAL | 0 refills | Status: AC
Start: 2023-06-08 — End: 2023-06-22

## 2023-06-08 MED ORDER — HYDROMORPHONE HCL 1 MG/ML IJ SOLN
1.0000 mg | Freq: Once | INTRAMUSCULAR | Status: AC
  Administered 2023-06-08: 1 mg via INTRAVENOUS
  Filled 2023-06-08: qty 1

## 2023-06-08 MED ORDER — HYDROMORPHONE HCL 1 MG/ML IJ SOLN
0.5000 mg | Freq: Once | INTRAMUSCULAR | Status: AC
  Administered 2023-06-09: 0.5 mg via INTRAVENOUS
  Filled 2023-06-08: qty 0.5

## 2023-06-08 MED ORDER — PANTOPRAZOLE SODIUM 40 MG PO TBEC
40.0000 mg | DELAYED_RELEASE_TABLET | Freq: Two times a day (BID) | ORAL | 0 refills | Status: DC
Start: 2023-06-08 — End: 2024-01-12

## 2023-06-08 MED ORDER — FAMOTIDINE IN NACL 20-0.9 MG/50ML-% IV SOLN
20.0000 mg | Freq: Once | INTRAVENOUS | Status: AC
  Administered 2023-06-09: 20 mg via INTRAVENOUS
  Filled 2023-06-08: qty 50

## 2023-06-08 MED ORDER — NITROGLYCERIN 0.4 MG SL SUBL
0.4000 mg | SUBLINGUAL_TABLET | SUBLINGUAL | Status: DC | PRN
  Administered 2023-06-08: 0.4 mg via SUBLINGUAL
  Filled 2023-06-08: qty 1

## 2023-06-08 MED ORDER — FUROSEMIDE 10 MG/ML IJ SOLN
40.0000 mg | Freq: Once | INTRAMUSCULAR | Status: AC
  Administered 2023-06-09: 40 mg via INTRAVENOUS
  Filled 2023-06-08: qty 4

## 2023-06-08 MED ORDER — HYDROMORPHONE HCL 1 MG/ML IJ SOLN
0.5000 mg | Freq: Once | INTRAMUSCULAR | Status: AC
  Administered 2023-06-08: 0.5 mg via INTRAVENOUS
  Filled 2023-06-08: qty 0.5

## 2023-06-08 NOTE — ED Provider Notes (Signed)
[facility] Provider Note    Event Date/Time   First MD Initiated Contact with Patient 06/08/23 1821     (approximate)   History   Chest Pain   HPI  Lawrence [name] is a 67 y.o. male past medical history significant for GERD, H. pylori, paroxysmal atrial fibrillation on Eliquis, CAD status post CABG, asthma, CKD, diabetes, OSA, who presents to the emergency department with chest pain.  Patient had a mechanical fall yesterday and was evaluated in the emergency department for back pain.  States that when he woke up this morning was having significant chest pain and having difficulty getting out of bed secondary to his chest pain.  His neighbor called his son and told him to come to the emergency department.  Received aspirin and nitroglycerin with EMS without significant improvement of his pain.  Currently rates his pain as 9/10.  Dull burning, pressure sensation that radiates from his left chest to his left shoulder.  Associate with some mild shortness of breath.  Denies any nausea vomiting but does endorse diaphoresis.  Significantly hypertensive with EMS and initially was in the 200s, improved to the 180s following nitroglycerin.  Endorses compliance with all of his home medications including Eliquis and his antihypertensive medications.  Does not believe that he hit his head or neck or his chest whenever he fell yesterday.  Denies history of DVT or PE.     Physical Exam   Triage Vital Signs: ED Triage Vitals [06/08/23 1822]  Encounter Vitals Group     BP      Systolic BP Percentile      Diastolic BP Percentile      Pulse      Resp      Temp      Temp src      SpO2      Weight 205 lb (93 kg)     Height 5\' 7"  (1.702 m)     Head Circumference      Peak Flow      Pain Score 9     Pain Loc      Pain Education      Exclude from Growth Chart     Most recent vital signs: Vitals:   06/08/23 2115 06/08/23 2120  BP: (!) 140/84   Pulse: 66   Resp: (!) 23    Temp:    SpO2: 92% 96%    Physical Exam Constitutional:      General: He is in acute distress.     Appearance: He is well-developed.  HENT:     Head: Atraumatic.  Eyes:     Conjunctiva/sclera: Conjunctivae normal.  Cardiovascular:     Rate and Rhythm: Regular rhythm.     Heart sounds: Normal heart sounds.  Pulmonary:     Effort: Tachypnea and respiratory distress present.     Comments: Faint crackles with end expiratory wheeze to bilateral lower lung fields.  No focal rhonchi or rales. Musculoskeletal:     Cervical back: Normal range of motion.     Right lower leg: No edema.     Left lower leg: No edema.  Skin:    General: Skin is warm.     Capillary Refill: Capillary refill takes less than 2 seconds.  Neurological:     General: No focal deficit present.     Mental Status: He is alert. Mental status is at baseline.     IMPRESSION / MDM / ASSESSMENT AND PLAN / ED  COURSE  I reviewed the triage vital signs and the nursing notes.  Differential diagnosis including rib fracture, pneumothorax, ACS, pneumonia  EKG  I, Corena Herter, the attending physician, personally viewed and interpreted this ECG.   Rate: Normal  Rhythm: Normal sinus  Axis: Normal  Intervals: Normal  ST&T Change: Isolated T wave that is inverted to aVL.  No significant change when compared to prior EKG.  Repeat EKG obtained given ongoing chest pressure.  No significant change when compared to initial EKG.  No significant ST elevation or depression and no signs of acute ischemia or dysrhythmia.  No tachycardic or bradycardic dysrhythmias while on cardiac telemetry.  RADIOLOGY I independently reviewed imaging, my interpretation of imaging: Chest x-ray with no signs of pneumothorax  CT chest abdomen pelvis with contrast given his recent fall with worsening pain with no acute fracture or dislocation.  Read as no acute findings.  LABS (all labs ordered are listed, but only abnormal results are  displayed) Labs interpreted as -    Labs Reviewed  CBC - Abnormal; Notable for the following components:      Result Value   RDW 20.2 (*)    All other components within normal limits  COMPREHENSIVE METABOLIC PANEL - Abnormal; Notable for the following components:   Glucose, Bld 160 (*)    All other components within normal limits  BRAIN NATRIURETIC PEPTIDE - Abnormal; Notable for the following components:   B Natriuretic Peptide 677.3 (*)    All other components within normal limits  TROPONIN I (HIGH SENSITIVITY) - Abnormal; Notable for the following components:   Troponin I (High Sensitivity) 33 (*)    All other components within normal limits  LIPASE, BLOOD  TROPONIN I (HIGH SENSITIVITY)     MDM  After chart review of last hospitalization for acute on chronic heart failure exacerbation patient was evaluated by cardiology with Dr. Darrold Junker at that time who felt that the patient likely had a chronically occluded bypass graft after his recent NSTEMI but did not feel that it was amenable to any further intervention and had an elevated troponin remained stable at 30 at that time.  Initial troponin is elevated at 33 which is consistent with his last acute on chronic heart failure exacerbation and admission for demand ischemia.  Plan for serial troponins.  Ongoing chest pain and received multiple doses of IV narcotic pain medications after nitroglycerin did not improve his pain.  Chronically has pain is 3/4.  On chart review this appears very similar to his last hospitalization where he was rating his pain as 9/10.  Did have some mild epigastric abdominal pain so ordered IV Pepcid for possible gastritis.  On reevaluation patient states that he had significant improvement of his pain.  Did ask for another dose of IV pain medication.  Repeat EKG without findings of acute ischemia or dysrhythmia.  Given IV Lasix given mild acute on chronic heart failure exacerbation.  Good diuresis in the  emergency department.  Serial troponins remained stable at 30 feel that the patient can be discharged home with close follow-up with cardiology.  Otherwise will need admitted for further management.     PROCEDURES:  Critical Care performed: No  Procedures  Patient's presentation is most consistent with acute presentation with potential threat to life or bodily function.   MEDICATIONS ORDERED IN ED: Medications  nitroGLYCERIN (NITROSTAT) SL tablet 0.4 mg (0.4 mg Sublingual Given 06/08/23 2108)  famotidine (PEPCID) IVPB 20 mg premix (has no administration in time  range)  furosemide (LASIX) injection 40 mg (has no administration in time range)  HYDROmorphone (DILAUDID) injection 0.5 mg (has no administration in time range)  fentaNYL (SUBLIMAZE) injection 50 mcg (50 mcg Intravenous Given 06/08/23 1926)  iohexol (OMNIPAQUE) 300 MG/ML solution 100 mL (100 mLs Intravenous Contrast Given 06/08/23 1931)  HYDROmorphone (DILAUDID) injection 0.5 mg (0.5 mg Intravenous Given 06/08/23 2117)  HYDROmorphone (DILAUDID) injection 1 mg (1 mg Intravenous Given 06/08/23 2215)    FINAL CLINICAL IMPRESSION(S) / ED DIAGNOSES   Final diagnoses:  None     Rx / DC Orders   ED Discharge Orders     None        Note:  This document was prepared using Dragon voice recognition software and may include unintentional dictation errors.   Corena Herter, MD 06/09/23 0005

## 2023-06-08 NOTE — ED Triage Notes (Signed)
PT arrives via EMS from home. Pt reports constant chest pain since 1000 this morning. Pt was given 324mg  of asa and 1 nitroglycerin without relief. Pt is AxOx4. PT reports associated sob and nausea. Pt is on eliquis. Was seen last night after falling at home. Denies hitting his head last night. Pt is hypertensive during triage.

## 2023-06-08 NOTE — Patient Instructions (Addendum)
To treat constipation: Start OTC MiraLAX powder, mix 1 capful in a drink once daily every day.

## 2023-06-09 DIAGNOSIS — R079 Chest pain, unspecified: Secondary | ICD-10-CM | POA: Diagnosis not present

## 2023-06-09 LAB — TROPONIN I (HIGH SENSITIVITY): Troponin I (High Sensitivity): 34 ng/L — ABNORMAL HIGH (ref <18)

## 2023-06-09 NOTE — ED Provider Notes (Signed)
Patient resting comfortably.  Resting, arouses easily to voice.  In no distress at this time, repeat troponin shows no significant rise or change.  Hemodynamics reassuring, and patient without distress.  Discussed with the patient is comfortable plan to follow-up with cardiologist Dr. Juliann Pares, and I placed urgent follow-up referral request to [facility] cardiology  Return precautions and treatment recommendations and follow-up discussed with the patient who is agreeable with the plan.     Sharyn Creamer, MD 06/09/23 [phone]

## 2023-06-10 ENCOUNTER — Telehealth: Payer: Self-pay

## 2023-06-10 NOTE — Telephone Encounter (Signed)
Transition Care Management Unsuccessful Follow-up Telephone Call  Date of discharge and from where:  [place] 7/20  Attempts:  1st Attempt  Reason for unsuccessful TCM follow-up call:  No answer/busy   Lenard Forth [facility] Guide, [facility] [phone] [address] [place], [place], [place] [address] Phone: [phone] Email: [email or web address]

## 2023-06-11 ENCOUNTER — Telehealth: Payer: Self-pay

## 2023-06-11 NOTE — Telephone Encounter (Signed)
Transition Care Management Follow-up Telephone Call Date of discharge and from where: [place]  7/20 How have you been since you were released from the hospital? Doing ok but till having hip pain  Any questions or concerns? No  Items Reviewed: Did the pt receive and understand the discharge instructions provided? Yes  Medications obtained and verified? Yes  Other? No  Any new allergies since your discharge? No  Dietary orders reviewed? No Do you have support at home? Yes    Follow up appointments reviewed:  PCP Hospital f/u appt confirmed? No  Scheduled to see  on  @ . Specialist Hospital f/u appt confirmed? Yes  Scheduled to see Cardi on 7/29 @ . Are transportation arrangements needed? No  If their condition worsens, is the pt aware to call PCP or go to the Emergency Dept.? Yes Was the patient provided with contact information for the PCP's office or ED? Yes Was to pt encouraged to call back with questions or concerns? Yes

## 2023-06-12 ENCOUNTER — Emergency Department: Payer: Medicare HMO

## 2023-06-12 ENCOUNTER — Other Ambulatory Visit: Payer: Self-pay

## 2023-06-12 ENCOUNTER — Emergency Department
Admission: EM | Admit: 2023-06-12 | Discharge: 2023-06-12 | Disposition: A | Discharge status: Home / Self Care | Payer: Medicare HMO | Attending: Emergency Medicine | Admitting: Emergency Medicine

## 2023-06-12 ENCOUNTER — Encounter: Payer: Self-pay | Admitting: Oncology

## 2023-06-12 DIAGNOSIS — N189 Chronic kidney disease, unspecified: Secondary | ICD-10-CM | POA: Insufficient documentation

## 2023-06-12 DIAGNOSIS — R0602 Shortness of breath: Secondary | ICD-10-CM | POA: Insufficient documentation

## 2023-06-12 DIAGNOSIS — I2510 Atherosclerotic heart disease of native coronary artery without angina pectoris: Secondary | ICD-10-CM | POA: Insufficient documentation

## 2023-06-12 DIAGNOSIS — Z955 Presence of coronary angioplasty implant and graft: Secondary | ICD-10-CM | POA: Diagnosis not present

## 2023-06-12 DIAGNOSIS — J45909 Unspecified asthma, uncomplicated: Secondary | ICD-10-CM | POA: Diagnosis not present

## 2023-06-12 DIAGNOSIS — Z7901 Long term (current) use of anticoagulants: Secondary | ICD-10-CM | POA: Diagnosis not present

## 2023-06-12 DIAGNOSIS — R0789 Other chest pain: Secondary | ICD-10-CM | POA: Diagnosis not present

## 2023-06-12 DIAGNOSIS — E119 Type 2 diabetes mellitus without complications: Secondary | ICD-10-CM | POA: Diagnosis not present

## 2023-06-12 DIAGNOSIS — I10 Essential (primary) hypertension: Secondary | ICD-10-CM | POA: Diagnosis not present

## 2023-06-12 DIAGNOSIS — R079 Chest pain, unspecified: Secondary | ICD-10-CM | POA: Diagnosis not present

## 2023-06-12 DIAGNOSIS — I4891 Unspecified atrial fibrillation: Secondary | ICD-10-CM | POA: Insufficient documentation

## 2023-06-12 DIAGNOSIS — R918 Other nonspecific abnormal finding of lung field: Secondary | ICD-10-CM | POA: Diagnosis not present

## 2023-06-12 DIAGNOSIS — I700 Atherosclerosis of aorta: Secondary | ICD-10-CM | POA: Diagnosis not present

## 2023-06-12 LAB — COMPREHENSIVE METABOLIC PANEL
ALT: 14 U/L (ref 0–44)
AST: 13 U/L — ABNORMAL LOW (ref 15–41)
Albumin: 3.4 g/dL — ABNORMAL LOW (ref 3.5–5.0)
Alkaline Phosphatase: 49 U/L (ref 38–126)
Anion gap: 9 (ref 5–15)
BUN: 12 mg/dL (ref 8–23)
CO2: 27 mmol/L (ref 22–32)
Calcium: 8.9 mg/dL (ref 8.9–10.3)
Chloride: 104 mmol/L (ref 98–111)
Creatinine, Ser: 0.87 mg/dL (ref 0.61–1.24)
GFR, Estimated: >60 mL/min (ref >60)
Glucose, Bld: 92 mg/dL (ref 70–99)
Potassium: 3.4 mmol/L — ABNORMAL LOW (ref 3.5–5.1)
Sodium: 140 mmol/L (ref 135–145)
Total Bilirubin: 0.9 mg/dL (ref 0.3–1.2)
Total Protein: 6.1 g/dL — ABNORMAL LOW (ref 6.5–8.1)

## 2023-06-12 LAB — CBC
HCT: 40.8 % (ref 39.0–52.0)
Hemoglobin: 12.8 g/dL — ABNORMAL LOW (ref 13.0–17.0)
MCH: 28 pg (ref 26.0–34.0)
MCHC: 31.4 g/dL (ref 30.0–36.0)
MCV: 89.3 fL (ref 80.0–100.0)
Platelets: 175 10*3/uL (ref 150–400)
RBC: 4.57 MIL/uL (ref 4.22–5.81)
RDW: 19.4 % — ABNORMAL HIGH (ref 11.5–15.5)
WBC: 6.3 10*3/uL (ref 4.0–10.5)
nRBC: 0 % (ref 0.0–0.2)

## 2023-06-12 LAB — BRAIN NATRIURETIC PEPTIDE: B Natriuretic Peptide: 498 pg/mL — ABNORMAL HIGH (ref 0.0–100.0)

## 2023-06-12 LAB — TROPONIN I (HIGH SENSITIVITY)
Troponin I (High Sensitivity): 23 ng/L — ABNORMAL HIGH (ref <18)
Troponin I (High Sensitivity): 23 ng/L — ABNORMAL HIGH (ref <18)

## 2023-06-12 MED ORDER — NITROGLYCERIN 0.4 MG SL SUBL
0.4000 mg | SUBLINGUAL_TABLET | SUBLINGUAL | Status: DC | PRN
  Administered 2023-06-12 (×3): 0.4 mg via SUBLINGUAL
  Filled 2023-06-12 (×2): qty 1

## 2023-06-12 MED ORDER — FAMOTIDINE IN NACL 20-0.9 MG/50ML-% IV SOLN
20.0000 mg | Freq: Once | INTRAVENOUS | Status: AC
  Administered 2023-06-12: 20 mg via INTRAVENOUS
  Filled 2023-06-12: qty 50

## 2023-06-12 MED ORDER — ONDANSETRON HCL 4 MG/2ML IJ SOLN
4.0000 mg | Freq: Once | INTRAMUSCULAR | Status: AC
  Administered 2023-06-12: 4 mg via INTRAVENOUS
  Filled 2023-06-12: qty 2

## 2023-06-12 MED ORDER — HYDROMORPHONE HCL 1 MG/ML IJ SOLN
0.5000 mg | Freq: Once | INTRAMUSCULAR | Status: AC
  Administered 2023-06-12: 0.5 mg via INTRAVENOUS
  Filled 2023-06-12: qty 0.5

## 2023-06-12 NOTE — Discharge Instructions (Signed)
You are seen in the emergency department for chest pain.  You had no findings of a heart attack today.  It is important that you continue to take your medication as prescribed including your Imdur.  Follow-up with your cardiologist on Monday for your scheduled appointment.  Return to the emergency department for any worsening symptoms.

## 2023-06-12 NOTE — ED Provider Notes (Signed)
[facility] Provider Note    Event Date/Time   First MD Initiated Contact with Patient 06/12/23 [phone]     (approximate)   History   Chest Pain (Pt in via EMS from home c/o CP that woke him from sleep at 2am, HX of MI, AFIB, COPD, previously had MI with 4 stents placed, EMS gave 324 aspirin, 1 spray of nitroglycerin, on 2L O2 at home at all times)   HPI  Lawrence [name] is a 67 y.o. male past medical history significant for CAD, asthma, CKD, diabetes, paroxysmal atrial fibrillation on Eliquis, GERD, OSA, presents to the emergency department with chest pain.  Patient states that he has been having chest pain that has been ongoing since his last hospitalization.  Complaining of chest pain that is radiating down his left arm and feeling like a tingling sensation.  Received aspirin and nitroglycerin with EMS prior to arrival without significant improvement of his chest pain.  He states that nitroglycerin does not work for him.  Endorses shortness of breath only with exertion.  No new cough.  Denies any nausea vomiting or diaphoresis.  Denies any swelling to his lower extremities.  States that he has been compliant with all of his home medications.  No history of DVT or PE.     Physical Exam   Triage Vital Signs: ED Triage Vitals  Encounter Vitals Group     BP      Systolic BP Percentile      Diastolic BP Percentile      Pulse      Resp      Temp      Temp src      SpO2      Weight      Height      Head Circumference      Peak Flow      Pain Score      Pain Loc      Pain Education      Exclude from Growth Chart     Most recent vital signs: Vitals:   06/12/23 0800 06/12/23 0900  BP: (!) 163/94 (!) 167/96  Pulse: 62 64  Resp: 20 12  Temp:    SpO2: 95% 100%    Physical Exam Constitutional:      Appearance: He is well-developed.  HENT:     Head: Atraumatic.  Eyes:     Conjunctiva/sclera: Conjunctivae normal.  Cardiovascular:     Rate and  Rhythm: Regular rhythm.     Heart sounds: No murmur heard. Pulmonary:     Effort: No respiratory distress.  Abdominal:     Tenderness: There is no abdominal tenderness.  Musculoskeletal:        General: Normal range of motion.     Cervical back: Normal range of motion.     Right lower leg: No edema.     Left lower leg: No edema.  Skin:    General: Skin is warm.     Comments: +2 radial and DP pulses that are equal bilaterally  Neurological:     Mental Status: He is alert. Mental status is at baseline.     IMPRESSION / MDM / ASSESSMENT AND PLAN / ED COURSE  I reviewed the triage vital signs and the nursing notes.  On chart review patient had a recent hospitalization earlier this month and was evaluated by cardiology who felt likely secondary to chronic occluded bypass graft that they did not feel was amenable to  further intervention including catheterization.  Troponins elevated in the 30s at that time.  Differential diagnosis including ACS, pneumonia, CHF exacerbation, viral illness including COVID.  Low suspicion for dissection, no tearing chest pain, equal pulses.  Low suspicion for pulmonary embolism, no pleuritic chest pain and feels similar to prior episodes of chest pain.  EKG  I, Corena Herter, the attending physician, personally viewed and interpreted this ECG.   Rate: Normal  Rhythm: Normal sinus  Axis: Normal  Intervals: Normal  ST&T Change: None No significant change when compared to prior EKG  No tachycardic or bradycardic dysrhythmias while on cardiac telemetry.  RADIOLOGY I independently reviewed imaging, my interpretation of imaging: Chest x-ray -no signs of pneumonia  LABS (all labs ordered are listed, but only abnormal results are displayed) Labs interpreted as -    Labs Reviewed  CBC - Abnormal; Notable for the following components:      Result Value   Hemoglobin 12.8 (*)    RDW 19.4 (*)    All other components within normal limits  COMPREHENSIVE  METABOLIC PANEL - Abnormal; Notable for the following components:   Potassium 3.4 (*)    Total Protein 6.1 (*)    Albumin 3.4 (*)    AST 13 (*)    All other components within normal limits  BRAIN NATRIURETIC PEPTIDE - Abnormal; Notable for the following components:   B Natriuretic Peptide 498.0 (*)    All other components within normal limits  TROPONIN I (HIGH SENSITIVITY) - Abnormal; Notable for the following components:   Troponin I (High Sensitivity) 23 (*)    All other components within normal limits  TROPONIN I (HIGH SENSITIVITY) - Abnormal; Notable for the following components:   Troponin I (High Sensitivity) 23 (*)    All other components within normal limits     MDM  Patient was given aspirin and nitroglycerin with EMS.  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  Patient does states that he is on Imdur and has been compliant with these medications.   On reevaluation patient had significant improvement of his chest pain and is now closer to his baseline pain that he rates as 3/4.  Has a follow-up appointment with cardiology on Monday.  Clinical picture is not consistent with dissection.  Have low suspicion for pulmonary embolism.  Not consistent with a pericarditis.  Possible some component of acid reflux.  Encouraged to take his Protonix, he is uncertain if he has been taking it or not since he has had multiple medication changes but he will check when he gets home.     PROCEDURES:  Critical Care performed: No  Procedures  Patient's presentation is most consistent with acute presentation with potential threat to life or bodily function.   MEDICATIONS ORDERED IN ED: Medications  nitroGLYCERIN (NITROSTAT) SL  tablet 0.4 mg (0.4 mg Sublingual Given 06/12/23 0825)  famotidine (PEPCID) IVPB 20 mg premix (has no administration in time range)  ondansetron (ZOFRAN) injection 4 mg (4 mg Intravenous Given 06/12/23 0939)  HYDROmorphone (DILAUDID) injection 0.5 mg (0.5 mg Intravenous Given 06/12/23 0939)    FINAL CLINICAL IMPRESSION(S) / ED DIAGNOSES   Final diagnoses:  Chest pain, unspecified type     Rx / DC Orders  ED Discharge Orders     None        Note:  This document was prepared using Dragon voice recognition software and may include unintentional dictation errors.   Corena Herter, MD 06/12/23 1052

## 2023-06-14 ENCOUNTER — Ambulatory Visit: Payer: Self-pay

## 2023-06-14 ENCOUNTER — Telehealth: Payer: Self-pay

## 2023-06-14 ENCOUNTER — Other Ambulatory Visit: Payer: Self-pay | Admitting: Family Medicine

## 2023-06-14 DIAGNOSIS — N182 Chronic kidney disease, stage 2 (mild): Secondary | ICD-10-CM | POA: Diagnosis not present

## 2023-06-14 DIAGNOSIS — I130 Hypertensive heart and chronic kidney disease with heart failure and stage 1 through stage 4 chronic kidney disease, or unspecified chronic kidney disease: Secondary | ICD-10-CM | POA: Diagnosis not present

## 2023-06-14 DIAGNOSIS — E1151 Type 2 diabetes mellitus with diabetic peripheral angiopathy without gangrene: Secondary | ICD-10-CM | POA: Diagnosis not present

## 2023-06-14 DIAGNOSIS — M545 Low back pain, unspecified: Secondary | ICD-10-CM

## 2023-06-14 DIAGNOSIS — J4489 Other specified chronic obstructive pulmonary disease: Secondary | ICD-10-CM | POA: Diagnosis not present

## 2023-06-14 DIAGNOSIS — I5033 Acute on chronic diastolic (congestive) heart failure: Secondary | ICD-10-CM | POA: Diagnosis not present

## 2023-06-14 DIAGNOSIS — I5023 Acute on chronic systolic (congestive) heart failure: Secondary | ICD-10-CM | POA: Diagnosis not present

## 2023-06-14 DIAGNOSIS — I480 Paroxysmal atrial fibrillation: Secondary | ICD-10-CM | POA: Diagnosis not present

## 2023-06-14 DIAGNOSIS — E1122 Type 2 diabetes mellitus with diabetic chronic kidney disease: Secondary | ICD-10-CM | POA: Diagnosis not present

## 2023-06-14 DIAGNOSIS — I25110 Atherosclerotic heart disease of native coronary artery with unstable angina pectoris: Secondary | ICD-10-CM | POA: Diagnosis not present

## 2023-06-14 NOTE — Telephone Encounter (Signed)
Call from [place] with [facility] regarding pt's medications.  Pt is taking 2 abx. Amoxicillin and Levofloxacin.   Archie Patten reports that there are drug interactions between  Levofloxacin with both Trazodone and Amiodarone.  Please advise.

## 2023-06-14 NOTE — Telephone Encounter (Signed)
The patient would like to speak with a member of clinical staff about their increasing back discomfort   The patient shares that their lower left side of their back has been sore which has now transferred to their lower right side   The patient shares that the discomfort has gone on for roughly 1 week   The patient would like to speak with a member of staff when possible   Please contact further when available    Chief Complaint: Left lower back pain. Asking for referral to orthopedic doctor and a refill on oxycodone. Declines OV. Symptoms: Above Frequency: 2 days Pertinent Negatives: Patient denies  Disposition: [] ED /[] Urgent Care (no appt availability in office) / [] Appointment(In office/virtual)/ []  [facility]/ [] Home Care/ [x] Refused Recommended Disposition /[] [facility]/ []  Follow-up with PCP Additional Notes: Please advise pt.  Reason for Disposition  [1] MODERATE back pain (e.g., interferes with normal activities) AND [2] present > 3 days  Answer Assessment - Initial Assessment Questions 1. ONSET: "When did the pain begin?"      15 years 2. LOCATION: "Where does it hurt?" (upper, mid or lower back)     Lower left 3. SEVERITY: "How bad is the pain?"  (e.g., Scale 1-10; mild, moderate, or severe)   - MILD (1-3): Doesn't interfere with normal activities.    - MODERATE (4-7): Interferes with normal activities or awakens from sleep.    - SEVERE (8-10): Excruciating pain, unable to do any normal activities.      10 4. PATTERN: "Is the pain constant?" (e.g., yes, no; constant, intermittent)      Constant 5. RADIATION: "Does the pain shoot into your legs or somewhere else?"     No 6. CAUSE:  "What do you think is causing the back pain?"      Fell 2 days ago 7. BACK OVERUSE:  "Any recent lifting of heavy objects, strenuous work or exercise?"     No 8. MEDICINES: "What have you taken so far for the pain?" (e.g., nothing, acetaminophen, NSAIDS)      Oxycodone 9. NEUROLOGIC SYMPTOMS: "Do you have any weakness, numbness, or problems with bowel/bladder control?"     No 10. OTHER SYMPTOMS: "Do you have any other symptoms?" (e.g., fever, abdomen pain, burning with urination, blood in urine)       No 11. PREGNANCY: "Is there any chance you are pregnant?" "When was your last menstrual period?"       N/A  Protocols used: Back Pain-A-AH

## 2023-06-15 ENCOUNTER — Emergency Department
Admission: EM | Admit: 2023-06-15 | Discharge: 2023-06-18 | Disposition: A | Discharge status: Home / Self Care | Payer: Medicare HMO | Attending: Emergency Medicine | Admitting: Emergency Medicine

## 2023-06-15 ENCOUNTER — Emergency Department: Payer: Medicare HMO

## 2023-06-15 ENCOUNTER — Encounter: Payer: Self-pay | Admitting: Emergency Medicine

## 2023-06-15 ENCOUNTER — Other Ambulatory Visit: Payer: Self-pay

## 2023-06-15 DIAGNOSIS — Z9861 Coronary angioplasty status: Secondary | ICD-10-CM | POA: Insufficient documentation

## 2023-06-15 DIAGNOSIS — N182 Chronic kidney disease, stage 2 (mild): Secondary | ICD-10-CM | POA: Insufficient documentation

## 2023-06-15 DIAGNOSIS — W19XXXA Unspecified fall, initial encounter: Secondary | ICD-10-CM | POA: Diagnosis not present

## 2023-06-15 DIAGNOSIS — M47812 Spondylosis without myelopathy or radiculopathy, cervical region: Secondary | ICD-10-CM | POA: Diagnosis not present

## 2023-06-15 DIAGNOSIS — Z043 Encounter for examination and observation following other accident: Secondary | ICD-10-CM | POA: Diagnosis not present

## 2023-06-15 DIAGNOSIS — I10 Essential (primary) hypertension: Secondary | ICD-10-CM | POA: Diagnosis not present

## 2023-06-15 DIAGNOSIS — I25110 Atherosclerotic heart disease of native coronary artery with unstable angina pectoris: Secondary | ICD-10-CM | POA: Diagnosis not present

## 2023-06-15 DIAGNOSIS — I480 Paroxysmal atrial fibrillation: Secondary | ICD-10-CM | POA: Diagnosis not present

## 2023-06-15 DIAGNOSIS — M4801 Spinal stenosis, occipito-atlanto-axial region: Secondary | ICD-10-CM | POA: Diagnosis not present

## 2023-06-15 DIAGNOSIS — R0989 Other specified symptoms and signs involving the circulatory and respiratory systems: Secondary | ICD-10-CM | POA: Diagnosis not present

## 2023-06-15 DIAGNOSIS — I517 Cardiomegaly: Secondary | ICD-10-CM | POA: Diagnosis not present

## 2023-06-15 DIAGNOSIS — M549 Dorsalgia, unspecified: Secondary | ICD-10-CM | POA: Diagnosis not present

## 2023-06-15 DIAGNOSIS — J45909 Unspecified asthma, uncomplicated: Secondary | ICD-10-CM | POA: Insufficient documentation

## 2023-06-15 DIAGNOSIS — Z7902 Long term (current) use of antithrombotics/antiplatelets: Secondary | ICD-10-CM | POA: Diagnosis not present

## 2023-06-15 DIAGNOSIS — E1122 Type 2 diabetes mellitus with diabetic chronic kidney disease: Secondary | ICD-10-CM | POA: Insufficient documentation

## 2023-06-15 DIAGNOSIS — M5030 Other cervical disc degeneration, unspecified cervical region: Secondary | ICD-10-CM | POA: Diagnosis not present

## 2023-06-15 DIAGNOSIS — Z79899 Other long term (current) drug therapy: Secondary | ICD-10-CM | POA: Insufficient documentation

## 2023-06-15 DIAGNOSIS — I5032 Chronic diastolic (congestive) heart failure: Secondary | ICD-10-CM | POA: Diagnosis not present

## 2023-06-15 DIAGNOSIS — G4489 Other headache syndrome: Secondary | ICD-10-CM | POA: Diagnosis not present

## 2023-06-15 DIAGNOSIS — M85851 Other specified disorders of bone density and structure, right thigh: Secondary | ICD-10-CM | POA: Diagnosis not present

## 2023-06-15 DIAGNOSIS — M160 Bilateral primary osteoarthritis of hip: Secondary | ICD-10-CM | POA: Diagnosis not present

## 2023-06-15 DIAGNOSIS — I5023 Acute on chronic systolic (congestive) heart failure: Secondary | ICD-10-CM | POA: Diagnosis not present

## 2023-06-15 DIAGNOSIS — I130 Hypertensive heart and chronic kidney disease with heart failure and stage 1 through stage 4 chronic kidney disease, or unspecified chronic kidney disease: Secondary | ICD-10-CM | POA: Diagnosis not present

## 2023-06-15 DIAGNOSIS — Z7901 Long term (current) use of anticoagulants: Secondary | ICD-10-CM | POA: Insufficient documentation

## 2023-06-15 DIAGNOSIS — J449 Chronic obstructive pulmonary disease, unspecified: Secondary | ICD-10-CM | POA: Insufficient documentation

## 2023-06-15 DIAGNOSIS — S0081XA Abrasion of other part of head, initial encounter: Secondary | ICD-10-CM | POA: Diagnosis not present

## 2023-06-15 DIAGNOSIS — Z87891 Personal history of nicotine dependence: Secondary | ICD-10-CM | POA: Diagnosis not present

## 2023-06-15 DIAGNOSIS — I5033 Acute on chronic diastolic (congestive) heart failure: Secondary | ICD-10-CM | POA: Diagnosis not present

## 2023-06-15 DIAGNOSIS — J418 Mixed simple and mucopurulent chronic bronchitis: Secondary | ICD-10-CM | POA: Diagnosis not present

## 2023-06-15 DIAGNOSIS — R531 Weakness: Secondary | ICD-10-CM | POA: Insufficient documentation

## 2023-06-15 DIAGNOSIS — E782 Mixed hyperlipidemia: Secondary | ICD-10-CM | POA: Diagnosis not present

## 2023-06-15 DIAGNOSIS — Y92512 Supermarket, store or market as the place of occurrence of the external cause: Secondary | ICD-10-CM | POA: Insufficient documentation

## 2023-06-15 DIAGNOSIS — W01198A Fall on same level from slipping, tripping and stumbling with subsequent striking against other object, initial encounter: Secondary | ICD-10-CM | POA: Diagnosis not present

## 2023-06-15 DIAGNOSIS — Z7984 Long term (current) use of oral hypoglycemic drugs: Secondary | ICD-10-CM | POA: Insufficient documentation

## 2023-06-15 DIAGNOSIS — I255 Ischemic cardiomyopathy: Secondary | ICD-10-CM | POA: Diagnosis not present

## 2023-06-15 DIAGNOSIS — I2510 Atherosclerotic heart disease of native coronary artery without angina pectoris: Secondary | ICD-10-CM | POA: Diagnosis not present

## 2023-06-15 DIAGNOSIS — E669 Obesity, unspecified: Secondary | ICD-10-CM | POA: Diagnosis not present

## 2023-06-15 DIAGNOSIS — Z951 Presence of aortocoronary bypass graft: Secondary | ICD-10-CM | POA: Insufficient documentation

## 2023-06-15 DIAGNOSIS — S60812A Abrasion of left wrist, initial encounter: Secondary | ICD-10-CM | POA: Insufficient documentation

## 2023-06-15 DIAGNOSIS — I700 Atherosclerosis of aorta: Secondary | ICD-10-CM | POA: Diagnosis not present

## 2023-06-15 DIAGNOSIS — J4489 Other specified chronic obstructive pulmonary disease: Secondary | ICD-10-CM | POA: Diagnosis not present

## 2023-06-15 DIAGNOSIS — R079 Chest pain, unspecified: Secondary | ICD-10-CM | POA: Diagnosis not present

## 2023-06-15 DIAGNOSIS — I6782 Cerebral ischemia: Secondary | ICD-10-CM | POA: Diagnosis not present

## 2023-06-15 DIAGNOSIS — G9389 Other specified disorders of brain: Secondary | ICD-10-CM | POA: Diagnosis not present

## 2023-06-15 DIAGNOSIS — Z955 Presence of coronary angioplasty implant and graft: Secondary | ICD-10-CM | POA: Diagnosis not present

## 2023-06-15 DIAGNOSIS — M85852 Other specified disorders of bone density and structure, left thigh: Secondary | ICD-10-CM | POA: Diagnosis not present

## 2023-06-15 DIAGNOSIS — E1151 Type 2 diabetes mellitus with diabetic peripheral angiopathy without gangrene: Secondary | ICD-10-CM | POA: Diagnosis not present

## 2023-06-15 LAB — CBC
HCT: 40.5 % (ref 39.0–52.0)
Hemoglobin: 12.6 g/dL — ABNORMAL LOW (ref 13.0–17.0)
MCH: 27.8 pg (ref 26.0–34.0)
MCHC: 31.1 g/dL (ref 30.0–36.0)
MCV: 89.2 fL (ref 80.0–100.0)
Platelets: 150 10*3/uL (ref 150–400)
RBC: 4.54 MIL/uL (ref 4.22–5.81)
RDW: 18.6 % — ABNORMAL HIGH (ref 11.5–15.5)
WBC: 7.2 10*3/uL (ref 4.0–10.5)
nRBC: 0 % (ref 0.0–0.2)

## 2023-06-15 LAB — BASIC METABOLIC PANEL
Anion gap: 6 (ref 5–15)
BUN: 23 mg/dL (ref 8–23)
CO2: 27 mmol/L (ref 22–32)
Calcium: 8.7 mg/dL — ABNORMAL LOW (ref 8.9–10.3)
Chloride: 102 mmol/L (ref 98–111)
Creatinine, Ser: 1.2 mg/dL (ref 0.61–1.24)
GFR, Estimated: >60 mL/min (ref >60)
Glucose, Bld: 147 mg/dL — ABNORMAL HIGH (ref 70–99)
Potassium: 3.9 mmol/L (ref 3.5–5.1)
Sodium: 135 mmol/L (ref 135–145)

## 2023-06-15 LAB — URINALYSIS, ROUTINE W REFLEX MICROSCOPIC
Bacteria, UA: NONE SEEN
Bilirubin Urine: NEGATIVE
Glucose, UA: >=500 mg/dL — AB
Hgb urine dipstick: NEGATIVE
Ketones, ur: NEGATIVE mg/dL
Leukocytes,Ua: NEGATIVE
Nitrite: NEGATIVE
Protein, ur: 30 mg/dL — AB
Specific Gravity, Urine: 1.03 (ref 1.005–1.030)
pH: 5 (ref 5.0–8.0)

## 2023-06-15 LAB — TROPONIN I (HIGH SENSITIVITY): Troponin I (High Sensitivity): 21 ng/L — ABNORMAL HIGH (ref <18)
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
## 2023-06-15 NOTE — ED Notes (Signed)
Patient transported to CT 

## 2023-06-15 NOTE — Telephone Encounter (Signed)
He  needs to stop the levofloxacin, can continue amoxilcillin

## 2023-06-15 NOTE — ED Triage Notes (Signed)
Pt arrives via [facility] from home with c/o fall x2 today. Fall at 1430 at [facility] and 1900 at home. Per pt, he lost balance and his feet keep giving out. Pt hit floor forward both times with no LOC. Pt c/o frontal head pain, bilateral hip pain, lower back pain and central chest pain. Pt with hx/o A-Fib with current rhythm on monitor at rate of 70-100bpm. Pt seen by his cardiologist this AM. New meds started 5 days prior for unknown infection and unknown name of meds. Pt on 2L chronically but wears PRN. Abrasions noted to the chin, right forehead and left wrist.

## 2023-06-16 ENCOUNTER — Ambulatory Visit: Payer: Self-pay | Admitting: *Deleted

## 2023-06-16 DIAGNOSIS — S0081XA Abrasion of other part of head, initial encounter: Secondary | ICD-10-CM | POA: Diagnosis not present

## 2023-06-16 MED ORDER — ATORVASTATIN CALCIUM 20 MG PO TABS
80.0000 mg | ORAL_TABLET | Freq: Every day | ORAL | Status: DC
  Administered 2023-06-16 – 2023-06-18 (×3): 80 mg via ORAL
  Filled 2023-06-16 (×3): qty 4

## 2023-06-16 MED ORDER — ONDANSETRON HCL 4 MG PO TABS: 4.0000 mg | ORAL_TABLET | Freq: Three times a day (TID) | ORAL | Status: DC | PRN

## 2023-06-16 MED ORDER — LEVOFLOXACIN 500 MG PO TABS
500.0000 mg | ORAL_TABLET | Freq: Every day | ORAL | Status: AC
  Administered 2023-06-16 – 2023-06-18 (×3): 500 mg via ORAL
  Filled 2023-06-16 (×3): qty 1

## 2023-06-16 MED ORDER — LORATADINE 10 MG PO TABS
10.0000 mg | ORAL_TABLET | Freq: Every day | ORAL | Status: DC
  Administered 2023-06-16 – 2023-06-18 (×3): 10 mg via ORAL
  Filled 2023-06-16 (×3): qty 1

## 2023-06-16 MED ORDER — PANTOPRAZOLE SODIUM 40 MG PO TBEC
40.0000 mg | DELAYED_RELEASE_TABLET | Freq: Two times a day (BID) | ORAL | Status: DC
  Administered 2023-06-16 – 2023-06-18 (×5): 40 mg via ORAL
  Filled 2023-06-16 (×5): qty 1

## 2023-06-16 MED ORDER — METOPROLOL SUCCINATE ER 25 MG PO TB24
12.5000 mg | ORAL_TABLET | Freq: Every day | ORAL | Status: DC
  Administered 2023-06-16: 12.5 mg via ORAL
  Filled 2023-06-16 (×3): qty 1

## 2023-06-16 MED ORDER — TRAZODONE HCL 50 MG PO TABS: 150.0000 mg | ORAL_TABLET | Freq: Every day | ORAL | Status: DC

## 2023-06-16 MED ORDER — APIXABAN 5 MG PO TABS
5.0000 mg | ORAL_TABLET | Freq: Two times a day (BID) | ORAL | Status: DC
  Administered 2023-06-16 – 2023-06-18 (×5): 5 mg via ORAL
  Filled 2023-06-16 (×5): qty 1

## 2023-06-16 MED ORDER — METFORMIN HCL ER 500 MG PO TB24
500.0000 mg | ORAL_TABLET | Freq: Two times a day (BID) | ORAL | Status: DC
  Administered 2023-06-16 – 2023-06-18 (×4): 500 mg via ORAL
  Filled 2023-06-16 (×6): qty 1

## 2023-06-16 MED ORDER — CELECOXIB 200 MG PO CAPS
200.0000 mg | ORAL_CAPSULE | Freq: Two times a day (BID) | ORAL | Status: DC | PRN
  Administered 2023-06-17: 200 mg via ORAL
  Filled 2023-06-16 (×2): qty 1

## 2023-06-16 MED ORDER — OXYCODONE-ACETAMINOPHEN 10-325 MG PO TABS: 1.0000 | ORAL_TABLET | ORAL | Status: DC | PRN

## 2023-06-16 MED ORDER — TRAZODONE HCL 50 MG PO TABS
150.0000 mg | ORAL_TABLET | Freq: Every day | ORAL | Status: DC
  Administered 2023-06-16 – 2023-06-17 (×2): 150 mg via ORAL
  Filled 2023-06-16 (×2): qty 1

## 2023-06-16 MED ORDER — LISINOPRIL 10 MG PO TABS
20.0000 mg | ORAL_TABLET | Freq: Every day | ORAL | Status: DC
  Administered 2023-06-16 – 2023-06-18 (×2): 20 mg via ORAL
  Filled 2023-06-16 (×3): qty 2

## 2023-06-16 MED ORDER — RANOLAZINE ER 500 MG PO TB12
1000.0000 mg | ORAL_TABLET | Freq: Two times a day (BID) | ORAL | Status: DC
  Administered 2023-06-16 – 2023-06-18 (×4): 1000 mg via ORAL
  Filled 2023-06-16 (×4): qty 2

## 2023-06-16 MED ORDER — TORSEMIDE 20 MG PO TABS
50.0000 mg | ORAL_TABLET | Freq: Every day | ORAL | Status: DC
  Administered 2023-06-16 – 2023-06-18 (×3): 50 mg via ORAL
  Filled 2023-06-16 (×3): qty 3

## 2023-06-16 MED ORDER — POLYSACCHARIDE IRON COMPLEX 150 MG PO CAPS
150.0000 mg | ORAL_CAPSULE | Freq: Every day | ORAL | Status: DC
  Administered 2023-06-16 – 2023-06-18 (×3): 150 mg via ORAL
  Filled 2023-06-16 (×3): qty 1

## 2023-06-16 MED ORDER — ALPRAZOLAM 0.5 MG PO TABS
1.0000 mg | ORAL_TABLET | Freq: Three times a day (TID) | ORAL | Status: DC | PRN
  Administered 2023-06-16 – 2023-06-17 (×3): 1 mg via ORAL
  Filled 2023-06-16 (×3): qty 2

## 2023-06-16 MED ORDER — UMECLIDINIUM BROMIDE 62.5 MCG/ACT IN AEPB
1.0000 | INHALATION_SPRAY | Freq: Every day | RESPIRATORY_TRACT | Status: DC
  Administered 2023-06-17 – 2023-06-18 (×2): 1 via RESPIRATORY_TRACT
  Filled 2023-06-16: qty 7

## 2023-06-16 MED ORDER — OXYCODONE-ACETAMINOPHEN 5-325 MG PO TABS
1.0000 | ORAL_TABLET | ORAL | Status: DC | PRN
  Administered 2023-06-16 – 2023-06-17 (×4): 1 via ORAL
  Filled 2023-06-16 (×6): qty 1

## 2023-06-16 MED ORDER — OXYCODONE HCL 5 MG PO TABS
5.0000 mg | ORAL_TABLET | ORAL | Status: DC | PRN
  Administered 2023-06-17 – 2023-06-18 (×4): 5 mg via ORAL
  Filled 2023-06-16 (×4): qty 1

## 2023-06-16 MED ORDER — FLUTICASONE FUROATE-VILANTEROL 200-25 MCG/ACT IN AEPB
1.0000 | INHALATION_SPRAY | Freq: Every day | RESPIRATORY_TRACT | Status: DC
  Administered 2023-06-17 – 2023-06-18 (×2): 1 via RESPIRATORY_TRACT
  Filled 2023-06-16: qty 28

## 2023-06-16 MED ORDER — GLIPIZIDE 5 MG PO TABS: 5.0000 mg | ORAL_TABLET | Freq: Every day | ORAL | Status: DC | PRN

## 2023-06-16 MED ORDER — TRAZODONE HCL 50 MG PO TABS
150.0000 mg | ORAL_TABLET | ORAL | Status: AC
  Administered 2023-06-16: 150 mg via ORAL
  Filled 2023-06-16: qty 1

## 2023-06-16 MED ORDER — DAPAGLIFLOZIN PROPANEDIOL 10 MG PO TABS
10.0000 mg | ORAL_TABLET | Freq: Every day | ORAL | Status: DC
  Administered 2023-06-16 – 2023-06-18 (×3): 10 mg via ORAL
  Filled 2023-06-16 (×3): qty 1

## 2023-06-16 MED ORDER — AMIODARONE HCL 200 MG PO TABS
200.0000 mg | ORAL_TABLET | Freq: Every day | ORAL | Status: DC
  Administered 2023-06-16 – 2023-06-18 (×3): 200 mg via ORAL
  Filled 2023-06-16 (×3): qty 1

## 2023-06-16 MED ORDER — ALLOPURINOL 300 MG PO TABS
300.0000 mg | ORAL_TABLET | Freq: Two times a day (BID) | ORAL | Status: DC
  Administered 2023-06-16 – 2023-06-18 (×4): 300 mg via ORAL
  Filled 2023-06-16 (×4): qty 1

## 2023-06-16 MED ORDER — OXYCODONE-ACETAMINOPHEN 5-325 MG PO TABS
2.0000 | ORAL_TABLET | Freq: Once | ORAL | Status: AC
  Administered 2023-06-16: 2 via ORAL
  Filled 2023-06-16: qty 2

## 2023-06-16 MED ORDER — VENLAFAXINE HCL ER 75 MG PO CP24
75.0000 mg | ORAL_CAPSULE | Freq: Every day | ORAL | Status: DC
  Administered 2023-06-17 – 2023-06-18 (×2): 75 mg via ORAL
  Filled 2023-06-16 (×3): qty 1

## 2023-06-16 MED ORDER — BUDESON-GLYCOPYRROL-FORMOTEROL 160-9-4.8 MCG/ACT IN AERO: 2.0000 | INHALATION_SPRAY | Freq: Two times a day (BID) | RESPIRATORY_TRACT | Status: DC

## 2023-06-16 MED ORDER — ISOSORBIDE MONONITRATE ER 60 MG PO TB24
120.0000 mg | ORAL_TABLET | Freq: Every day | ORAL | Status: DC
  Administered 2023-06-16 – 2023-06-18 (×3): 120 mg via ORAL
  Filled 2023-06-16 (×3): qty 2

## 2023-06-16 MED ORDER — OMEGA-3-ACID ETHYL ESTERS 1 G PO CAPS
4.0000 | ORAL_CAPSULE | Freq: Every day | ORAL | Status: DC
  Administered 2023-06-17 – 2023-06-18 (×2): 4 g via ORAL
  Filled 2023-06-16 (×2): qty 4

## 2023-06-16 MED ORDER — ACETAMINOPHEN 325 MG PO TABS: 650.0000 mg | ORAL_TABLET | ORAL | Status: DC | PRN

## 2023-06-16 MED ORDER — ALPRAZOLAM 0.5 MG PO TABS
1.0000 mg | ORAL_TABLET | Freq: Once | ORAL | Status: AC
  Administered 2023-06-16: 1 mg via ORAL
  Filled 2023-06-16: qty 2

## 2023-06-16 MED ORDER — METHOCARBAMOL 500 MG PO TABS
500.0000 mg | ORAL_TABLET | Freq: Three times a day (TID) | ORAL | Status: DC | PRN
  Administered 2023-06-16 – 2023-06-18 (×3): 500 mg via ORAL
  Filled 2023-06-16 (×6): qty 1
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
## 2023-06-16 NOTE — TOC Progression Note (Signed)
Transition of Care [facility]) - Progression Note    Patient Details  Name: Lawrence [name] MRN: [id number] Date of Birth: 10/23/1956  Transition of Care [facility] CM/SW Contact  Kreg Shropshire, RN Phone Number: 06/16/2023, 3:37 PM  Clinical Narrative:    Cm spoke with [facility] admissions with [facility]. [facility] confirmed that they take pts from the ED. Cm spoke with pt and he agreed for [facility]. Cm started authorization via [employer] portal. As of 1543, auth is pending     Barriers to Discharge: Continued Medical Work up  Expected Discharge Plan and Services     Post Acute Care Choice: Home Health, Skilled Nursing Facility Living arrangements for the past 2 months: Single Family Home                                       Social Determinants of Health (SDOH) Interventions SDOH Screenings   Food Insecurity: No Food Insecurity (05/19/2023)  Housing: Low Risk  (05/19/2023)  Transportation Needs: No Transportation Needs (05/19/2023)  Utilities: Not At Risk (05/17/2023)  Alcohol Screen: Low Risk  (03/10/2023)  Depression (PHQ2-9): Medium Risk (06/02/2023)  Financial Resource Strain: Low Risk  (03/10/2023)  Physical Activity: Inactive (03/10/2023)  Social Connections: Moderately Isolated (03/10/2023)  Stress: No Stress Concern Present (03/10/2023)  Tobacco Use: Medium Risk (06/15/2023)    Readmission Risk Interventions    05/18/2023   10:56 AM  Readmission Risk Prevention Plan  Transportation Screening Complete  Medication Review (RN Care Manager) Complete  PCP or Specialist appointment within 3-5 days of discharge Complete  HRI or Home Care Consult Complete  SW Recovery Care/Counseling Consult Complete  Palliative Care Screening Not Applicable  Skilled Nursing Facility Not Applicable

## 2023-06-16 NOTE — ED Notes (Signed)
Pt transitioned to a hospital bed to promote comfort.  Call bell within reach. No additional concerns at this time.

## 2023-06-16 NOTE — ED Notes (Signed)
Provided urine incontinent care, changed bed linen, bed pad, and changed pt into hospital gown. Placed pt belongings in bags and kept at bed side. Belongings: smartphone, reading glasses, pair of shoes, pants, wallet, belt, and t shirt.

## 2023-06-16 NOTE — Addendum Note (Signed)
Addended by: Malva Limes on: 06/16/2023 01:35 PM   Modules accepted: Orders

## 2023-06-16 NOTE — Progress Notes (Addendum)
  [facility] Customer service manager [facility]) Accountable Care Organization (ACO) [facility] ED Hospital Liaison Note  06/16/2023  [name] 1956/07/31  Location: met patient at bedside at [facility] ED.  Insurance: [facility] HMO Medicare   Lawrence [name] is a 67 y.o. male who is a Primary Care Patient of Sherrie Mustache, Demetrios Isaacs, MD The patient was screened for 7 and 30 day readmission hospitalization with noted extreme risk score for unplanned readmission risk with 1 inpatient admission & 7 ED visits in previous 30 days. Prior score for 30 day risk for unplanned readmission was documented at 72.31 on 05/18/23. Current score is not available in ED record at this time.   The patient was assessed for potential [facility]) Care Management service needs for post hospital transition for care coordination. Review of patient's electronic medical record reveals patient is currently being followed by [facility] Care Management, who has already acknowledged patient location in ED and the plan for disposition to STR/SNF pending. Patient states he is going to [facility].   Plan: Disposition pending to STR/SNF per [facility] TOC progress notes. [facility] Care Management aware of location and follow up plan for STR/SNF placement that is pending final disposition as of 06/16/23.   NOTE: [facility] Care Management/Population Health does not replace or interfere with any arrangements made by the Inpatient Transition of Care team.    Gabriel Cirri MSN RN [facility] ED liaison [email or web address] Main Phone: [phone]

## 2023-06-16 NOTE — Patient Outreach (Signed)
  Care Coordination   Follow Up Visit Note   06/16/2023 Name: Lawrence [name] MRN: [id number] DOB: 1956/03/17  CONN BUNDICK is a 67 y.o. year old male who sees [name], Demetrios Isaacs, MD for primary care.   Patient scheduled for outreach, noted currently in the ED.     SDOH assessments and interventions completed:  No     Care Coordination Interventions:  No, not indicated   Follow up plan:  Pending disposition.  Per TOC note, recommendations are for SNF for short term rehab.     Encounter Outcome:  Pt. Visit Completed   Kemper Durie, RN, MSN, [facility] Care Management Care Management Coordinator [phone]
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
## 2023-06-16 NOTE — Evaluation (Signed)
Physical Therapy Evaluation Patient Details Name: Lawrence [name] MRN: [id number] DOB: 1956/10/14 Today's Date: 06/16/2023  History of Present Illness  Lawrence [name] is a 67yoM who was here in early June after sudden weakness at home, unable to rise from couch as in previous days, ended up on floor, unable to rise after being home from [facility] about 1 week.  He now returns with similar weakness, difficulty with mobility and 2 falls.  Clinical Impression  Pt laying in bed c/o significant low back pain at rest, but willing to do some limited mobility/ambulation per PT request.  He was guarded and functionally limited did did not need excessive assist with mobility/transfer/ambulation tasks.  Pt has had numerous recent falls, generalized weakness and difficulty managing at home.  He will benefit from continued PT to address these limitations and work towards a safe transition at discharge.       If plan is discharge home, recommend the following:     Can travel by private vehicle   Yes    Equipment Recommendations None recommended by PT  Recommendations for Other Services       Functional Status Assessment Patient has had a recent decline in their functional status and demonstrates the ability to make significant improvements in function in a reasonable and predictable amount of time.     Precautions / Restrictions Precautions Precautions: Fall Restrictions Weight Bearing Restrictions: No      Mobility  Bed Mobility Overal bed mobility: Needs Assistance Bed Mobility: Sidelying to Sit, Supine to Sit   Sidelying to sit: Min assist Supine to sit: Min assist     General bed mobility comments: pt showed good effort with getting to/from supine.  c/o pain, slow guarded movements, light assist with LEs during transitions    Transfers Overall transfer level: Needs assistance Equipment used: Rolling walker (2 wheels) Transfers: Sit to/from Stand Sit to Stand: Min assist            General transfer comment: Pt c/o severe LBP during mobility, cuing for set up, light assist to actually attain standing    Ambulation/Gait Ambulation/Gait assistance: Min guard Gait Distance (Feet): 15 Feet Assistive device: Rolling walker (2 wheels)         General Gait Details: Once upright pt was able to take a few slow, guarded steps with heavy walker reliance.  Pt quick to fatigue and c/o acute on chronic back pain necessitating need to sit.  2L O2 t/o the session with SpO2 remaining in the 90s.  Stairs            Wheelchair Mobility     Tilt Bed    Modified Rankin (Stroke Patients Only)       Balance Overall balance assessment: Needs assistance Sitting-balance support: Bilateral upper extremity supported Sitting balance-Leahy Scale: Good     Standing balance support: Bilateral upper extremity supported Standing balance-Leahy Scale: Fair                               Pertinent Vitals/Pain Pain Assessment Pain Assessment: 0-10 Pain Score: 9  Pain Location: chronic back pain    Home Living Family/patient expects to be discharged to:: Skilled nursing facility                   Additional Comments: O2 2L    Prior Function Prior Level of Function : Independent/Modified Independent;History of Falls (last six months)  Mobility Comments: Mod I using rollator, has had numerous falls - does not drive but neighbor takes him to run errands 1-2x/wk - limited OOH ambulator ADLs Comments: Pt reports IND for ADLs/IADLs. Pt not driving, receives medical transport to appointments and friend takes him to grocery store.     Hand Dominance        Extremity/Trunk Assessment   Upper Extremity Assessment Upper Extremity Assessment: Generalized weakness    Lower Extremity Assessment Lower Extremity Assessment: Generalized weakness       Communication   Communication: No difficulties  Cognition Arousal/Alertness:  Awake/alert Behavior During Therapy: WFL for tasks assessed/performed Overall Cognitive Status: Within Functional Limits for tasks assessed                                          General Comments General comments (skin integrity, edema, etc.): Pt reliant on walker, generally weak and pain limited    Exercises     Assessment/Plan    PT Assessment Patient needs continued PT services  PT Problem List Decreased strength;Decreased range of motion;Decreased activity tolerance;Decreased balance;Decreased knowledge of use of DME;Decreased safety awareness;Decreased mobility;Pain       PT Treatment Interventions DME instruction;Gait training;Functional mobility training;Therapeutic activities;Therapeutic exercise;Balance training;Patient/family education    PT Goals (Current goals can be found in the Care Plan section)  Acute Rehab PT Goals Patient Stated Goal: go to rehab to get stronger PT Goal Formulation: With patient Time For Goal Achievement: 06/29/23 Potential to Achieve Goals: Fair    Frequency Min 1X/week     Co-evaluation               AM-PAC PT "6 Clicks" Mobility  Outcome Measure Help needed turning from your back to your side while in a flat bed without using bedrails?: A Little Help needed moving from lying on your back to sitting on the side of a flat bed without using bedrails?: A Little Help needed moving to and from a bed to a chair (including a wheelchair)?: A Little Help needed standing up from a chair using your arms (e.g., wheelchair or bedside chair)?: A Little Help needed to walk in hospital room?: A Little Help needed climbing 3-5 steps with a railing? : A Lot 6 Click Score: 17    End of Session Equipment Utilized During Treatment: Gait belt;Oxygen Activity Tolerance: Patient limited by pain;Patient limited by fatigue Patient left: in bed;with call bell/phone within reach   PT Visit Diagnosis: Muscle weakness (generalized)  (M62.81);Difficulty in walking, not elsewhere classified (R26.2);Pain Pain - part of body:  (lumbago)    Time: 8657-8469 PT Time Calculation (min) (ACUTE ONLY): 16 min   Charges:   PT Evaluation $PT Eval Low Complexity: 1 Low   PT General Charges $$ ACUTE PT VISIT: 1 Visit         Malachi Pro, DPT 06/16/2023, 11:40 AM

## 2023-06-16 NOTE — TOC CM/SW Note (Signed)
Cm received consult for SNF Placement. Cm awaiting PT/OT evaluations. Cm will continue to follow for toc needs.

## 2023-06-16 NOTE — Telephone Encounter (Signed)
Lawrence [name] advised and states she will make the patient aware

## 2023-06-16 NOTE — ED Notes (Signed)
ED Provider at bedside. 

## 2023-06-16 NOTE — ED Provider Notes (Signed)
-----------------------------------------   2:01 PM on 06/16/2023 -----------------------------------------   Blood pressure (!) 141/93, pulse 76, temperature 97.6 F (36.4 C), temperature source Oral, resp. rate 13, height 5\' 7"  (1.702 m), weight 93 kg, SpO2 97%.  The patient is calm and cooperative at this time.  There have been no acute events since the last update.   Home medications reordered.  Patient was evaluated by PT.   Awaiting disposition plan from case management/social work.    Corena Herter, MD 06/16/23 1401

## 2023-06-17 ENCOUNTER — Telehealth: Payer: Self-pay

## 2023-06-17 DIAGNOSIS — S0081XA Abrasion of other part of head, initial encounter: Secondary | ICD-10-CM | POA: Diagnosis not present

## 2023-06-17 MED ORDER — LIDOCAINE 5 % EX PTCH
1.0000 | MEDICATED_PATCH | CUTANEOUS | Status: DC
  Administered 2023-06-17 – 2023-06-18 (×2): 1 via TRANSDERMAL
  Filled 2023-06-17 (×2): qty 1

## 2023-06-17 NOTE — ED Notes (Signed)
Pt requesting pain meds

## 2023-06-17 NOTE — ED Notes (Signed)
Pt requesting to speak with provider Dolores Frame in regards to ordered PRN pain meds no being adequate for pain relief. MD Dolores Frame made aware of pt request.

## 2023-06-17 NOTE — ED Notes (Signed)
Pt given food tray per request.

## 2023-06-17 NOTE — ED Notes (Signed)
Pt now requesting prn robaxin and oxycodone and a diet ginger ale.

## 2023-06-17 NOTE — Progress Notes (Addendum)
Physical Therapy Treatment Patient Details Name: Lawrence [name] MRN: [id number] DOB: 1956/09/21 Today's Date: 06/17/2023   History of Present Illness Lawrence [name] is a 67yoM who was here in early June after sudden weakness at home, unable to rise from couch as in previous days, ended up on floor, unable to rise after being home from [facility] about 1 week.  He now returns with similar weakness, difficulty with mobility and 2 falls.    PT Comments  Pt was pleasant and motivated to participate during the session and put forth good effort throughout. Pt continuing to present with guarded motions and hesitance throughout d/t severe 10/10 sharp pack pain (Pt was premedicated ~1 hr prior to session). Initial session readings 98% SpO2, 61 BPM. Pt slow with bed mobility, having difficult time adjusting self in bed without use of hand rails/bed tilt, ultimately requiring bed rail to attain sitting. Provided cues for safe and proper transition positioning to/from standing at RW. Ambulated with slow speed (not community appropriate) with RW on 2L O2 for 35 feet, rigid and guarded posture throughout distance limited by back pain, SpO2 96% and HR 74 BPM after bout. Pt notes distance ambulated is less than what he typically can walk. Pt will benefit from continued PT services upon discharge to safely address deficits listed in patient problem list for decreased caregiver assistance and eventual return to PLOF.      If plan is discharge home, recommend the following:     Can travel by private vehicle     Yes  Equipment Recommendations  Other (comment) (TBD at next venue) has 4WW, will likely need FWW   Recommendations for Other Services       Precautions / Restrictions Precautions Precautions: Fall Restrictions Weight Bearing Restrictions: No     Mobility  Bed Mobility Overal bed mobility: Needs Assistance Bed Mobility: Supine to Sit, Sidelying to Sit, Sit to Supine   Sidelying to sit: Min  assist Supine to sit: Min assist Sit to supine: Min assist   General bed mobility comments: pt showed good effort but needed Min guarding. pt grimacing throughout, guarded motion thoughout with poor form, needed bed tilt and bed rail assit for adjustment and positioning self in bed.    Transfers Overall transfer level: Needs assistance Equipment used: Rolling walker (2 wheels) Transfers: Sit to/from Stand Sit to Stand: Min guard           General transfer comment: Pt having severe LBP during movement, guarded and hesitant effort. Needs assist/cues for setup, proper hand positioning, and safety. Pt fails at initial attempt to stand with self selected strategy, pt cued for UE placement in following attempt with success    Ambulation/Gait Ambulation/Gait assistance: Min guard Gait Distance (Feet): 35 Feet Assistive device: Rolling walker (2 wheels) Gait Pattern/deviations: Step-through pattern, Decreased step length - right, Decreased step length - left, Decreased weight shift to right, Antalgic       General Gait Details: Pt able to ambulate longer distance compared to evaluation; on 2L O2 throughout. Continued reliance on walker. pt limited by pain throughout, exhibiting rigid/guarding posture. Motivated to walk longer distances but unable this session secondary to pain.   Stairs             Wheelchair Mobility     Tilt Bed    Modified Rankin (Stroke Patients Only)       Balance Overall balance assessment: Needs assistance Sitting-balance support: Feet supported, Bilateral upper extremity supported Sitting balance-Leahy Scale: Good  Standing balance support: Bilateral upper extremity supported, UE reliant on AD  Standing balance-Leahy Scale: Fair                              Cognition Arousal/Alertness: Awake/alert Behavior During Therapy: WFL for tasks assessed/performed Overall Cognitive Status: Within Functional Limits for tasks  assessed                                          Exercises      General Comments        Pertinent Vitals/Pain Pain Assessment Pain Assessment: 0-10 Pain Score: 10-Worst pain ever Pain Location: chronic back pain Pain Descriptors / Indicators: Sharp, Aching. Hypersensitivity response to mild tactile palpation of back. Pain Intervention(s): Monitored during session, Premedicated before session    Home Living                          Prior Function            PT Goals (current goals can now be found in the care plan section) Progress towards PT goals: Progressing toward goals    Frequency    Min 1X/week      PT Plan Current plan remains appropriate    Co-evaluation              AM-PAC PT "6 Clicks" Mobility   Outcome Measure  Help needed turning from your back to your side while in a flat bed without using bedrails?: A Little Help needed moving from lying on your back to sitting on the side of a flat bed without using bedrails?: A Little Help needed moving to and from a bed to a chair (including a wheelchair)?: A Little Help needed standing up from a chair using your arms (e.g., wheelchair or bedside chair)?: A Little Help needed to walk in hospital room?: A Little Help needed climbing 3-5 steps with a railing? : A Lot 6 Click Score: 17    End of Session Equipment Utilized During Treatment: Gait belt;Oxygen Activity Tolerance: Patient limited by pain;Patient limited by fatigue Patient left: in bed;with call bell/phone within reach;with bed alarm set   PT Visit Diagnosis: Muscle weakness (generalized) (M62.81);Difficulty in walking, not elsewhere classified (R26.2);Pain Pain - part of body:  (Lumbago)     Time: 7846-9629 PT Time Calculation (min) (ACUTE ONLY): 29 min  Charges:                            Lawrence [name], SPT 06/17/23, 2:52 PM  This entire session was performed under direct supervision and direction of  a licensed therapist/therapist assistant. I have personally read, edited and approve of the note as written.  Lawrence [name], DPT

## 2023-06-17 NOTE — TOC Progression Note (Addendum)
Transition of Care [facility]) - Progression Note    Patient Details  Name: Lawrence [name] MRN: [id number] Date of Birth: 1956/10/30  [facility]) CM/SW Contact  Darleene Cleaver, [place] Phone Number: 06/17/2023, 4:15 PM  Clinical Narrative:     Insurance authorization is still pending, reference number [id number].  Per [employer] they are requesting updated clinicals.  CSW sent clinicals to insurance company.  5:15pm Patient has been approved by insurance company for SNF placement at [facility] [place] Plan auth ID [id number].  Patient has been approved for 5 days, next review 06/21/2023.  CSW updated bedside nurse, SNF admissions worker Revonda Standard and CSW who will be covering tomorrow.  Per Revonda Standard, patient can discharge tomorrow, will patient will need hard scripts for controlled meds that patient will be discharging on.    Barriers to Discharge: Continued Medical Work up  Expected Discharge Plan and Services     Post Acute Care Choice: Home Health, Skilled Nursing Facility Living arrangements for the past 2 months: Single Family Home                                       Social Determinants of Health (SDOH) Interventions SDOH Screenings   Food Insecurity: No Food Insecurity (05/19/2023)  Housing: Low Risk  (05/19/2023)  Transportation Needs: No Transportation Needs (05/19/2023)  Utilities: Not At Risk (05/17/2023)  Alcohol Screen: Low Risk  (03/10/2023)  Depression (PHQ2-9): Medium Risk (06/02/2023)  Financial Resource Strain: Low Risk  (03/10/2023)  Physical Activity: Inactive (03/10/2023)  Social Connections: Moderately Isolated (03/10/2023)  Stress: No Stress Concern Present (03/10/2023)  Tobacco Use: Medium Risk (06/15/2023)    Readmission Risk Interventions    05/18/2023   10:56 AM  Readmission Risk Prevention Plan  Transportation Screening Complete  Medication Review (RN Care Manager) Complete  PCP or Specialist appointment within 3-5 days  of discharge Complete  HRI or Home Care Consult Complete  SW Recovery Care/Counseling Consult Complete  Palliative Care Screening Not Applicable  Skilled Nursing Facility Not Applicable

## 2023-06-17 NOTE — ED Notes (Addendum)
Informed from SW that pt is going to [facility] tomorrow morning 06/18/23. We will need to have controlled substance scripts printed to go with d/c paperwork.

## 2023-06-17 NOTE — ED Provider Notes (Signed)
-----------------------------------------   6:22 AM on 06/17/2023 -----------------------------------------   Blood pressure 124/72, pulse 72, temperature 98.2 F (36.8 C), temperature source Oral, resp. rate 14, height 5\' 7"  (1.702 m), weight 93 kg, SpO2 96%.  The patient is calm and cooperative at this time.  There have been no acute events since the last update.  Awaiting disposition plan from Social Work team.   Irean Hong, MD 06/17/23 [id number]

## 2023-06-17 NOTE — Telephone Encounter (Signed)
Copied from CRM [id number]. Topic: General - Inquiry >> Jun 16, 2023  4:54 PM Marlow Baars wrote: Reason for CRM: Soni with [facility] called in to report the patient has had a couple of falls and he is going to go to a short term rehab. He is currently in [facility]. Please assist further if necessary

## 2023-06-17 NOTE — ED Notes (Signed)
Pt requesting medication for back pain, per PRN orders offered tylenol or celebrex. Pt amicable to take a dose of celebrex.

## 2023-06-17 NOTE — ED Notes (Signed)
PT requested diet ginger ale; provided.

## 2023-06-17 NOTE — ED Notes (Signed)
Provided the pt with meal tray and drink as requested.

## 2023-06-17 NOTE — ED Notes (Signed)
Offered pt PRN percocet or tylenol. Pt refused at this time, states that percocet has not been effective for his back pan.

## 2023-06-17 NOTE — ED Notes (Signed)
PT working with pt.

## 2023-06-18 DIAGNOSIS — S60812A Abrasion of left wrist, initial encounter: Secondary | ICD-10-CM | POA: Diagnosis not present

## 2023-06-18 DIAGNOSIS — Z951 Presence of aortocoronary bypass graft: Secondary | ICD-10-CM | POA: Diagnosis not present

## 2023-06-18 DIAGNOSIS — J45909 Unspecified asthma, uncomplicated: Secondary | ICD-10-CM | POA: Diagnosis not present

## 2023-06-18 DIAGNOSIS — I252 Old myocardial infarction: Secondary | ICD-10-CM | POA: Diagnosis not present

## 2023-06-18 DIAGNOSIS — F32A Depression, unspecified: Secondary | ICD-10-CM | POA: Diagnosis not present

## 2023-06-18 DIAGNOSIS — S0081XA Abrasion of other part of head, initial encounter: Secondary | ICD-10-CM | POA: Diagnosis not present

## 2023-06-18 DIAGNOSIS — Z743 Need for continuous supervision: Secondary | ICD-10-CM | POA: Diagnosis not present

## 2023-06-18 DIAGNOSIS — R079 Chest pain, unspecified: Secondary | ICD-10-CM | POA: Diagnosis not present

## 2023-06-18 DIAGNOSIS — I130 Hypertensive heart and chronic kidney disease with heart failure and stage 1 through stage 4 chronic kidney disease, or unspecified chronic kidney disease: Secondary | ICD-10-CM | POA: Diagnosis not present

## 2023-06-18 DIAGNOSIS — E782 Mixed hyperlipidemia: Secondary | ICD-10-CM | POA: Diagnosis not present

## 2023-06-18 DIAGNOSIS — Z87891 Personal history of nicotine dependence: Secondary | ICD-10-CM | POA: Diagnosis not present

## 2023-06-18 DIAGNOSIS — Z7401 Bed confinement status: Secondary | ICD-10-CM | POA: Diagnosis not present

## 2023-06-18 DIAGNOSIS — Z955 Presence of coronary angioplasty implant and graft: Secondary | ICD-10-CM | POA: Diagnosis not present

## 2023-06-18 DIAGNOSIS — R1010 Upper abdominal pain, unspecified: Secondary | ICD-10-CM | POA: Diagnosis not present

## 2023-06-18 DIAGNOSIS — I5033 Acute on chronic diastolic (congestive) heart failure: Secondary | ICD-10-CM | POA: Diagnosis not present

## 2023-06-18 DIAGNOSIS — Z7901 Long term (current) use of anticoagulants: Secondary | ICD-10-CM | POA: Diagnosis not present

## 2023-06-18 DIAGNOSIS — I509 Heart failure, unspecified: Secondary | ICD-10-CM | POA: Diagnosis not present

## 2023-06-18 DIAGNOSIS — I2510 Atherosclerotic heart disease of native coronary artery without angina pectoris: Secondary | ICD-10-CM | POA: Diagnosis not present

## 2023-06-18 DIAGNOSIS — R0789 Other chest pain: Secondary | ICD-10-CM | POA: Diagnosis not present

## 2023-06-18 DIAGNOSIS — I10 Essential (primary) hypertension: Secondary | ICD-10-CM | POA: Diagnosis not present

## 2023-06-18 DIAGNOSIS — I700 Atherosclerosis of aorta: Secondary | ICD-10-CM | POA: Diagnosis not present

## 2023-06-18 DIAGNOSIS — W1830XD Fall on same level, unspecified, subsequent encounter: Secondary | ICD-10-CM | POA: Diagnosis not present

## 2023-06-18 DIAGNOSIS — R5381 Other malaise: Secondary | ICD-10-CM | POA: Diagnosis not present

## 2023-06-18 DIAGNOSIS — R531 Weakness: Secondary | ICD-10-CM | POA: Diagnosis not present

## 2023-06-18 DIAGNOSIS — R0902 Hypoxemia: Secondary | ICD-10-CM | POA: Diagnosis not present

## 2023-06-18 DIAGNOSIS — M62838 Other muscle spasm: Secondary | ICD-10-CM | POA: Diagnosis not present

## 2023-06-18 DIAGNOSIS — E119 Type 2 diabetes mellitus without complications: Secondary | ICD-10-CM | POA: Diagnosis not present

## 2023-06-18 DIAGNOSIS — J449 Chronic obstructive pulmonary disease, unspecified: Secondary | ICD-10-CM | POA: Diagnosis not present

## 2023-06-18 MED ORDER — OXYCODONE-ACETAMINOPHEN 10-325 MG PO TABS: 1.0000 | ORAL_TABLET | ORAL | 0 refills | Status: DC | PRN

## 2023-06-18 MED ORDER — OXYCODONE HCL 5 MG PO TABS: 5.0000 mg | ORAL_TABLET | Freq: Three times a day (TID) | ORAL | 0 refills | Status: DC | PRN

## 2023-06-18 MED ORDER — ALPRAZOLAM 1 MG PO TABS: 1.0000 mg | ORAL_TABLET | Freq: Three times a day (TID) | ORAL | 0 refills | Status: DC | PRN

## 2023-06-18 NOTE — ED Notes (Signed)
[facility] CALLED FOR TRANSPORT TO [facility]
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
## 2023-06-18 NOTE — ED Provider Notes (Signed)
TOC has been able to arrange placement.  Patient medically cleared and stable for discharge.   Willy Eddy, MD 06/18/23 [phone]

## 2023-06-18 NOTE — TOC Transition Note (Addendum)
Transition of Care [facility]) - CM/SW Discharge Note   Patient Details  Name: Lawrence [name] MRN: [id number] Date of Birth: Jul 31, 1956  [facility]) CM/SW Contact:  Liliana Cline, LCSW Phone Number: 06/18/2023, 10:19 AM   Clinical Narrative:    Discharge to [facility] today. Room 507A. Confirmed with Admissions Worker Revonda Standard. Updated MD, RN. Asked RN to call report. ED Secretary to arrange EMS transport.   11:45- Notified by RN that [facility] is asking for all orders to be faxed to them. Called Bourbon at [place] who states she can pull everything from Epic, no need to fax anything. Updated RN.  1:30[facility] states they need to know if patient is on Amoxicillin. Per MD patient is on no antibiotics. Had MD sign AVS and sent to [facility] by secure encrypted email.      Final next level of care: Skilled Nursing Facility Barriers to Discharge: Barriers Resolved   Patient Goals and CMS Choice CMS Medicare.gov Compare Post Acute Care list provided to:: Patient Choice offered to / list presented to : Patient  Discharge Placement                Patient chooses bed at: [facility] Patient to be transferred to facility by: [facility]      Discharge Plan and Services Additional resources added to the After Visit Summary for       Post Acute Care Choice: Home Health, Skilled Nursing Facility                               Social Determinants of Health (SDOH) Interventions SDOH Screenings   Food Insecurity: No Food Insecurity (05/19/2023)  Housing: Low Risk  (05/19/2023)  Transportation Needs: No Transportation Needs (05/19/2023)  Utilities: Not At Risk (05/17/2023)  Alcohol Screen: Low Risk  (03/10/2023)  Depression (PHQ2-9): Medium Risk (06/02/2023)  Financial Resource Strain: Low Risk  (03/10/2023)  Physical Activity: Inactive (03/10/2023)  Social Connections: Moderately Isolated (03/10/2023)  Stress: No Stress Concern  Present (03/10/2023)  Tobacco Use: Medium Risk (06/15/2023)     Readmission Risk Interventions    05/18/2023   10:56 AM  Readmission Risk Prevention Plan  Transportation Screening Complete  Medication Review (RN Care Manager) Complete  PCP or Specialist appointment within 3-5 days of discharge Complete  HRI or Home Care Consult Complete  SW Recovery Care/Counseling Consult Complete  Palliative Care Screening Not Applicable  Skilled Nursing Facility Not Applicable

## 2023-06-20 DIAGNOSIS — Z87891 Personal history of nicotine dependence: Secondary | ICD-10-CM | POA: Diagnosis not present

## 2023-06-20 DIAGNOSIS — R0902 Hypoxemia: Secondary | ICD-10-CM | POA: Diagnosis not present

## 2023-06-20 DIAGNOSIS — Z951 Presence of aortocoronary bypass graft: Secondary | ICD-10-CM | POA: Diagnosis not present

## 2023-06-20 DIAGNOSIS — R079 Chest pain, unspecified: Secondary | ICD-10-CM | POA: Diagnosis not present

## 2023-06-20 DIAGNOSIS — I252 Old myocardial infarction: Secondary | ICD-10-CM | POA: Diagnosis not present

## 2023-06-20 DIAGNOSIS — Z7401 Bed confinement status: Secondary | ICD-10-CM | POA: Diagnosis not present

## 2023-06-20 DIAGNOSIS — I10 Essential (primary) hypertension: Secondary | ICD-10-CM | POA: Diagnosis not present

## 2023-06-20 DIAGNOSIS — R0789 Other chest pain: Secondary | ICD-10-CM | POA: Diagnosis not present

## 2023-06-20 DIAGNOSIS — Z955 Presence of coronary angioplasty implant and graft: Secondary | ICD-10-CM | POA: Diagnosis not present

## 2023-06-21 DIAGNOSIS — I509 Heart failure, unspecified: Secondary | ICD-10-CM | POA: Diagnosis not present

## 2023-06-21 DIAGNOSIS — R5381 Other malaise: Secondary | ICD-10-CM | POA: Diagnosis not present

## 2023-06-21 DIAGNOSIS — E119 Type 2 diabetes mellitus without complications: Secondary | ICD-10-CM | POA: Diagnosis not present

## 2023-06-21 NOTE — Progress Notes (Signed)
Established patient visit   Patient: Lawrence [name]   DOB: 30-Nov-1955   67 y.o. Male  MRN: [id number] Visit Date: 06/02/2023  Today's healthcare provider: [name], MD   Chief Complaint  Patient presents with   Follow-up    Patient was seen at [facility] ED on 06/01/23 early morning due to chest pain. Patient did see cardio yesterday in the afternoon after ED visit. Patient advised to continue current medications. Patient denies any chest pain, does feel more tired. He reports persistent shortness of breath. Does use oxygen at home.    Subjective    Discussed the use of AI scribe software for clinical note transcription with the patient, who gave verbal consent to proceed.  History of Present Illness   The patient, with a history of emphysema and COPD, was recently hospitalized due to chest pain. The chest pain has since resolved and a recent consultation with a cardiologist did not indicate any cardiac issues. However, the patient reports increased difficulty breathing over the past month, more so than usual. This is accompanied by a cough, but no wheezing. The patient also reports constant fatigue and a lack of energy.  The patient was previously on Bretri inhaler, which was changed to [place]. Since the change, the patient reports more difficulty breathing and expresses a preference for the previous inhaler, Teresa Pelton, which seemed to provide better relief. The patient has not yet obtained the [facility] from a pharmacy, only having used samples provided by the pulmonologist.  Additionally, the patient reports persistent pain in the hip area, which started two days prior after hitting it on a hospital bed. The patient denies any fall or injury associated with the onset of the pain. The pain is localized and tender to touch.       Medications: Outpatient Medications Prior to Visit  Medication Sig   Accu-Chek Softclix Lancets lancets Use as instructed to check sugar  daily for type 2 diabetes.   acetaminophen (TYLENOL) 325 MG tablet Take 2 tablets (650 mg total) by mouth every 4 (four) hours as needed for headache or mild pain.   albuterol (VENTOLIN HFA) 108 (90 Base) MCG/ACT inhaler INHALE 2 PUFFS BY MOUTH EVERY 6 HOURS AS NEEDED FOR SHORTNESS OF BREATH (Patient not taking: Reported on 06/16/2023)   allopurinol (ZYLOPRIM) 300 MG tablet TAKE 1 TABLET BY MOUTH TWICE A DAY   amiodarone (PACERONE) 200 MG tablet Take 1 tablet (200 mg total) by mouth daily.   atorvastatin (LIPITOR) 80 MG tablet Take 1 tablet (80 mg total) by mouth daily. Please schedule office visit before any future refill.   Blood Glucose Calibration (ACCU-CHEK GUIDE CONTROL) LIQD Use with blood glucose monitor as directed   Blood Glucose Monitoring Suppl (ACCU-CHEK GUIDE) w/Device KIT Use to check blood sugars as directed   celecoxib (CELEBREX) 200 MG capsule Take 1 capsule (200 mg total) by mouth 2 (two) times daily as needed. Home med.   cetirizine (ZYRTEC) 10 MG tablet TAKE 1 TABLET BY MOUTH AT BEDTIME   clopidogrel (PLAVIX) 75 MG tablet Take 1 tablet (75 mg total) by mouth daily. (Patient not taking: Reported on 06/16/2023)   dicyclomine (BENTYL) 10 MG capsule Take 1 capsule (10 mg total) by mouth 3 (three) times daily as needed (abdominal pain). (Patient not taking: Reported on 06/16/2023)   ELIQUIS 5 MG TABS tablet TAKE 1 TABLET BY MOUTH TWICE A DAY   glipiZIDE (GLUCOTROL) 5 MG tablet Take 1 tablet (5  mg total) by mouth daily as needed (sugar over 300).   glucose blood (ACCU-CHEK GUIDE) test strip Use as instructed to check sugar daily for type 2 diabetes.   iron polysaccharides (NIFEREX) 150 MG capsule Take 1 capsule (150 mg total) by mouth daily.   isosorbide mononitrate (IMDUR) 120 MG 24 hr tablet Take 1 tablet (120 mg total) by mouth daily.   lisinopril (ZESTRIL) 20 MG tablet Take 20 mg by mouth daily.   meclizine (ANTIVERT) 25 MG tablet TAKE ONE TABLET BY THREE TIMES A DAY AS NEEDED FOR  DIZZINESS   metFORMIN (GLUCOPHAGE-XR) 500 MG 24 hr tablet TAKE 1 TABLET BY MOUTH TWICE A DAY   methocarbamol (ROBAXIN) 500 MG tablet Take 500 mg by mouth every 8 (eight) hours as needed for muscle spasms. Take 1 tablet by mouth every hour as needed for muscle spasms   metoprolol succinate (TOPROL-XL) 25 MG 24 hr tablet Take 0.5 tablets (12.5 mg total) by mouth daily.   nitroGLYCERIN (NITROSTAT) 0.4 MG SL tablet Place 1 tablet (0.4 mg total) under the tongue every 5 (five) minutes as needed for chest pain.   omega-3 acid ethyl esters (LOVAZA) 1 g capsule Take 4 capsules (4 g total) by mouth daily.   ondansetron (ZOFRAN) 4 MG tablet Take 1 tablet (4 mg total) by mouth every 8 (eight) hours as needed.   ranolazine (RANEXA) 1000 MG SR tablet Take 1 tablet (1,000 mg total) by mouth 2 (two) times daily.   torsemide (DEMADEX) 100 MG tablet Take 0.5 tablets (50 mg total) by mouth daily.   traZODone (DESYREL) 150 MG tablet TAKE 1 TABLET BY MOUTH AT BEDTIME   venlafaxine XR (EFFEXOR-XR) 75 MG 24 hr capsule TAKE 1 CAPSULE BY MOUTH DAILY WITH BREAKFAST   [DISCONTINUED] ALPRAZolam (XANAX) 1 MG tablet Take 1 mg by mouth 3 (three) times daily as needed.   [DISCONTINUED] mometasone-formoterol (DULERA) 100-5 MCG/ACT AERO Inhale 2 puffs into the lungs 2 (two) times daily.   [DISCONTINUED] omeprazole (PRILOSEC) 40 MG capsule TAKE 1 CAPSULE BY MOUTH 2 TIMES DAILY 30 MINUTES BEFORE BREAKFAST AND DINNER   [DISCONTINUED] oxyCODONE-acetaminophen (PERCOCET) 10-325 MG tablet TAKE 1 TABLET BY MOUTH EVERY 4 HOURS AS NEEDED FOR PAIN   [DISCONTINUED] pantoprazole (PROTONIX) 40 MG tablet Take 1 tablet (40 mg total) by mouth daily.   [DISCONTINUED] umeclidinium bromide (INCRUSE ELLIPTA) 62.5 MCG/ACT AEPB Inhale 1 puff into the lungs daily.   No facility-administered medications prior to visit.   Review of Systems     Objective    BP (!) 157/92 (BP Location: Left Arm, Patient Position: Sitting, Cuff Size: Large)   Pulse 71    Temp 98.1 F (36.7 C) (Temporal)   Resp 12   Wt 192 lb 1.6 oz (87.1 kg)   SpO2 93%   BMI 30.09 kg/m   Physical Exam  Physical Exam   CHEST: Lungs clear to auscultation, no signs of infection, some emphysema COPD noted. MUSCULOSKELETAL: Hip tenderness upon palpation.    No results found for any visits on 06/02/23.  Assessment & Plan     Assessment and Plan    COPD: Increased shortness of breath and fatigue over the past month. No signs of infection on examination. Patient reports better symptom control with Breztri compared to current regimen of Incruse and Dulera. -Discontinue Incruse and Dulera. -Prescribe Warden/ranger. -Advise patient to continue current inhalers until available at the pharmacy. -Schedule follow-up appointment with pulmonologist.  Hip Bursitis: New onset of hip pain  after hitting it on a hospital bed. No fall or injury reported. Pain on palpation. -Advise patient to apply ice to the area every 3-4 hours. -Consider cortisone injection if symptoms persist, acknowledging potential side effects.  Follow-up: Schedule a follow-up appointment in three months.       Return in about 3 months (around 09/02/2023).      [name], MD  [facility] [phone] (phone) [phone] (fax)  [facility]

## 2023-07-01 ENCOUNTER — Telehealth: Payer: Self-pay | Admitting: Family Medicine

## 2023-07-01 NOTE — Telephone Encounter (Signed)
Tiffany from [facility] called to f/u on orders that were faxed on 7/31 and 8/14 [id number]. Advised to give more time but she said she would refax again today.

## 2023-07-02 ENCOUNTER — Other Ambulatory Visit: Payer: Self-pay | Admitting: Family Medicine

## 2023-07-02 ENCOUNTER — Telehealth: Payer: Self-pay | Admitting: Family Medicine

## 2023-07-02 DIAGNOSIS — I5033 Acute on chronic diastolic (congestive) heart failure: Secondary | ICD-10-CM | POA: Diagnosis not present

## 2023-07-02 DIAGNOSIS — J4489 Other specified chronic obstructive pulmonary disease: Secondary | ICD-10-CM | POA: Diagnosis not present

## 2023-07-02 DIAGNOSIS — I25110 Atherosclerotic heart disease of native coronary artery with unstable angina pectoris: Secondary | ICD-10-CM | POA: Diagnosis not present

## 2023-07-02 DIAGNOSIS — E1151 Type 2 diabetes mellitus with diabetic peripheral angiopathy without gangrene: Secondary | ICD-10-CM | POA: Diagnosis not present

## 2023-07-02 DIAGNOSIS — I480 Paroxysmal atrial fibrillation: Secondary | ICD-10-CM | POA: Diagnosis not present

## 2023-07-02 DIAGNOSIS — N182 Chronic kidney disease, stage 2 (mild): Secondary | ICD-10-CM | POA: Diagnosis not present

## 2023-07-02 DIAGNOSIS — I5023 Acute on chronic systolic (congestive) heart failure: Secondary | ICD-10-CM | POA: Diagnosis not present

## 2023-07-02 DIAGNOSIS — E1122 Type 2 diabetes mellitus with diabetic chronic kidney disease: Secondary | ICD-10-CM | POA: Diagnosis not present

## 2023-07-02 DIAGNOSIS — I130 Hypertensive heart and chronic kidney disease with heart failure and stage 1 through stage 4 chronic kidney disease, or unspecified chronic kidney disease: Secondary | ICD-10-CM | POA: Diagnosis not present

## 2023-07-02 NOTE — Telephone Encounter (Signed)
That's fine

## 2023-07-02 NOTE — Telephone Encounter (Signed)
Verbals given  

## 2023-07-02 NOTE — Telephone Encounter (Signed)
Copied from CRM [id number]. Topic: Quick Communication - Home Health Verbal Orders >> Jul 02, 2023 12:23 PM Macon Large wrote: Caller/Agency: Soni with [facility] Callback Number: [phone] Requesting OT/PT/Skilled Nursing/Social Work/Speech Therapy: PT, OT and nursing evaluation Frequency: PT = 1 x 5

## 2023-07-05 ENCOUNTER — Encounter (INDEPENDENT_AMBULATORY_CARE_PROVIDER_SITE_OTHER): Payer: Self-pay | Admitting: Vascular Surgery

## 2023-07-05 ENCOUNTER — Ambulatory Visit (INDEPENDENT_AMBULATORY_CARE_PROVIDER_SITE_OTHER): Payer: Medicare HMO | Admitting: Vascular Surgery

## 2023-07-05 ENCOUNTER — Ambulatory Visit (INDEPENDENT_AMBULATORY_CARE_PROVIDER_SITE_OTHER): Payer: Medicare HMO

## 2023-07-05 VITALS — BP 154/101 | HR 82 | Resp 16 | Wt 195.0 lb

## 2023-07-05 DIAGNOSIS — J449 Chronic obstructive pulmonary disease, unspecified: Secondary | ICD-10-CM

## 2023-07-05 DIAGNOSIS — I25110 Atherosclerotic heart disease of native coronary artery with unstable angina pectoris: Secondary | ICD-10-CM

## 2023-07-05 DIAGNOSIS — I480 Paroxysmal atrial fibrillation: Secondary | ICD-10-CM | POA: Diagnosis not present

## 2023-07-05 DIAGNOSIS — I739 Peripheral vascular disease, unspecified: Secondary | ICD-10-CM

## 2023-07-05 DIAGNOSIS — I110 Hypertensive heart disease with heart failure: Secondary | ICD-10-CM

## 2023-07-05 DIAGNOSIS — I10 Essential (primary) hypertension: Secondary | ICD-10-CM | POA: Diagnosis not present

## 2023-07-05 DIAGNOSIS — K219 Gastro-esophageal reflux disease without esophagitis: Secondary | ICD-10-CM

## 2023-07-05 DIAGNOSIS — E1151 Type 2 diabetes mellitus with diabetic peripheral angiopathy without gangrene: Secondary | ICD-10-CM

## 2023-07-05 DIAGNOSIS — M1030 Gout due to renal impairment, unspecified site: Secondary | ICD-10-CM

## 2023-07-05 DIAGNOSIS — E1122 Type 2 diabetes mellitus with diabetic chronic kidney disease: Secondary | ICD-10-CM

## 2023-07-05 DIAGNOSIS — I25118 Atherosclerotic heart disease of native coronary artery with other forms of angina pectoris: Secondary | ICD-10-CM

## 2023-07-05 DIAGNOSIS — I5043 Acute on chronic combined systolic (congestive) and diastolic (congestive) heart failure: Secondary | ICD-10-CM

## 2023-07-05 DIAGNOSIS — J4489 Other specified chronic obstructive pulmonary disease: Secondary | ICD-10-CM

## 2023-07-05 DIAGNOSIS — N182 Chronic kidney disease, stage 2 (mild): Secondary | ICD-10-CM

## 2023-07-05 DIAGNOSIS — D631 Anemia in chronic kidney disease: Secondary | ICD-10-CM

## 2023-07-05 DIAGNOSIS — E1121 Type 2 diabetes mellitus with diabetic nephropathy: Secondary | ICD-10-CM

## 2023-07-05 DIAGNOSIS — I255 Ischemic cardiomyopathy: Secondary | ICD-10-CM

## 2023-07-05 NOTE — Progress Notes (Signed)
MRN : [id number]  Lawrence [name] is a 66 y.o. ([birth year]) male who presents with chief complaint of check circulation.  History of Present Illness:   The patient is seen for evaluation of painful lower extremities. Patient notes the pain is variable and not always associated with activity.  The pain is somewhat consistent day to day occurring on most days. The patient notes the pain also occurs with standing and routinely seems worse as the day wears on. The pain has been progressive over the past several years. The patient states these symptoms are causing  a negative impact on quality of life and daily activities which was a factor in the referral.  The patient has a  history of back problems and DJD of the lumbar and sacral spine.   The patient denies rest pain or dangling of an extremity off the side of the bed during the night for relief. No open wounds or sores at this time. No history of DVT or phlebitis. No prior vascular interventions or surgeries.    ABI's Rt=1.13 and Lt=1.10  Current Meds  Medication Sig   Accu-Chek Softclix Lancets lancets Use as instructed to check sugar daily for type 2 diabetes.   acetaminophen (TYLENOL) 325 MG tablet Take 2 tablets (650 mg total) by mouth every 4 (four) hours as needed for headache or mild pain.   allopurinol (ZYLOPRIM) 300 MG tablet TAKE 1 TABLET BY MOUTH TWICE A DAY   ALPRAZolam (XANAX) 1 MG tablet Take 1 tablet (1 mg total) by mouth 3 (three) times daily as needed.   amiodarone (PACERONE) 200 MG tablet Take 1 tablet (200 mg total) by mouth daily.   atorvastatin (LIPITOR) 80 MG tablet Take 1 tablet (80 mg total) by mouth daily. Please schedule office visit before any future refill.   Blood Glucose Calibration (ACCU-CHEK GUIDE CONTROL) LIQD Use with blood glucose monitor as directed   Blood Glucose Monitoring Suppl (ACCU-CHEK GUIDE) w/Device KIT Use to check blood  sugars as directed   Budeson-Glycopyrrol-Formoterol (BREZTRI AEROSPHERE) 160-9-4.8 MCG/ACT AERO Inhale 2 puffs into the lungs 2 (two) times daily.   celecoxib (CELEBREX) 200 MG capsule Take 1 capsule (200 mg total) by mouth 2 (two) times daily as needed. Home med.   cetirizine (ZYRTEC) 10 MG tablet TAKE 1 TABLET BY MOUTH AT BEDTIME   ELIQUIS 5 MG TABS tablet TAKE 1 TABLET BY MOUTH TWICE A DAY   FARXIGA 10 MG TABS tablet Take 10 mg by mouth daily.   glipiZIDE (GLUCOTROL) 5 MG tablet Take 1 tablet (5 mg total) by mouth daily as needed (sugar over 300).   glucose blood (ACCU-CHEK GUIDE) test strip Use as instructed to check sugar daily for type 2 diabetes.   iron polysaccharides (NIFEREX) 150 MG capsule Take 1 capsule (150 mg total) by mouth daily.   isosorbide mononitrate (IMDUR) 120 MG 24 hr tablet Take 1 tablet (120 mg total) by mouth daily.   lisinopril (ZESTRIL) 20 MG tablet Take 20 mg by mouth daily.   meclizine (ANTIVERT) 25 MG tablet TAKE ONE TABLET BY THREE TIMES A DAY AS  NEEDED FOR DIZZINESS   metFORMIN (GLUCOPHAGE-XR) 500 MG 24 hr tablet TAKE 1 TABLET BY MOUTH TWICE A DAY   methocarbamol (ROBAXIN) 500 MG tablet Take 500 mg by mouth every 8 (eight) hours as needed for muscle spasms. Take 1 tablet by mouth every hour as needed for muscle spasms   metoprolol succinate (TOPROL-XL) 25 MG 24 hr tablet Take 0.5 tablets (12.5 mg total) by mouth daily.   nitroGLYCERIN (NITROSTAT) 0.4 MG SL tablet Place 1 tablet (0.4 mg total) under the tongue every 5 (five) minutes as needed for chest pain.   omega-3 acid ethyl esters (LOVAZA) 1 g capsule Take 4 capsules (4 g total) by mouth daily.   ondansetron (ZOFRAN) 4 MG tablet Take 1 tablet (4 mg total) by mouth every 8 (eight) hours as needed.   oxyCODONE (ROXICODONE) 5 MG immediate release tablet Take 1 tablet (5 mg total) by mouth every 8 (eight) hours as needed.   oxyCODONE-acetaminophen (PERCOCET) 10-325 MG tablet Take 1 tablet by mouth every 4 (four)  hours as needed. for pain   ranolazine (RANEXA) 1000 MG SR tablet Take 1 tablet (1,000 mg total) by mouth 2 (two) times daily.   torsemide (DEMADEX) 100 MG tablet Take 0.5 tablets (50 mg total) by mouth daily.   traZODone (DESYREL) 150 MG tablet TAKE 1 TABLET BY MOUTH AT BEDTIME   venlafaxine XR (EFFEXOR-XR) 75 MG 24 hr capsule TAKE 1 CAPSULE BY MOUTH DAILY WITH BREAKFAST    Past Medical History:  Diagnosis Date   A-fib (HCC)    Anemia    Anginal pain (HCC)    Anxiety    Arthritis    Asthma    CAD (coronary artery disease)    a. 2002 CABGx2 (LIMA->LAD, VG->VG->OM1);  b. 09/2012 DES->OM;  c. 03/2015 PTCA of LAD [facility]) in setting of atretic LIMA; d. 05/2015 Cath [facility]): nonobs dzs; e. 06/2015 Cath ([facility]): LM nl, LAD 45p/d ISR, 50d, D1/2 small, LCX 50p/d ISR, OM1 70ost, 30 ISR, VG->OM1 50ost, 36m, LIMA->LAD 99p/d - atretic, RCA dom, nl; f.cath 10/16: 40-50%(FFR 0.90) pLAD, 75% (FFR 0.77) mLAD s/p PCI/DES, oRCA 40% (FFR0.95)   Cancer (HCC)    SKIN CANCER ON BACK   Celiac disease    Chronic diastolic CHF (congestive heart failure) (HCC)    a. 06/2009 Echo: EF 60-65%, Gr 1 DD, triv AI, mildly dil LA, nl RV.   COPD (chronic obstructive pulmonary disease) (HCC)    a. Chronic bronchitis and emphysema.   DDD (degenerative disc disease), lumbar    Diverticulosis    Dysrhythmia    Essential hypertension    GERD (gastroesophageal reflux disease)    History of hiatal hernia    History of kidney stones    H/O   History of tobacco abuse    a. Quit 2014.   Myocardial infarction [facility]) 2002   4 STENTS   Pancreatitis    PSVT (paroxysmal supraventricular tachycardia)    a. 10/2012 Noted on Zio Patch.   Sleep apnea    LOST CORD TO CPAP -ONLY 02 @ BEDTIME   Tubular adenoma of colon    Type II diabetes mellitus (HCC)     Past Surgical History:  Procedure Laterality Date   BYPASS GRAFT     CARDIAC CATHETERIZATION N/A 07/12/2015   rocedure: Left Heart Cath and Cors/Grafts Angiography;  Surgeon: Lyn Records, MD;  Location: [facility] INVASIVE CV LAB;  Service: Cardiovascular;  Laterality: N/A;   CARDIAC CATHETERIZATION Right 10/07/2015   Procedure: Left  Heart Cath and Cors/Grafts Angiography;  Surgeon: Laurier Nancy, MD;  Location: [facility] INVASIVE CV LAB;  Service: Cardiovascular;  Laterality: Right;   CARDIAC CATHETERIZATION N/A 04/06/2016   Procedure: Left Heart Cath and Coronary Angiography;  Surgeon: Alwyn Pea, MD;  Location: [facility] INVASIVE CV LAB;  Service: Cardiovascular;  Laterality: N/A;   CARDIAC CATHETERIZATION  04/06/2016   Procedure: Bypass Graft Angiography;  Surgeon: Alwyn Pea, MD;  Location: [facility] INVASIVE CV LAB;  Service: Cardiovascular;;   CARDIAC CATHETERIZATION N/A 11/02/2016   Procedure: Left Heart Cath and Cors/Grafts Angiography and possible PCI;  Surgeon: Alwyn Pea, MD;  Location: [facility] INVASIVE CV LAB;  Service: Cardiovascular;  Laterality: N/A;   CARDIAC CATHETERIZATION N/A 11/02/2016   Procedure: Coronary Stent Intervention;  Surgeon: Alwyn Pea, MD;  Location: [facility] INVASIVE CV LAB;  Service: Cardiovascular;  Laterality: N/A;   CARDIOVERSION N/A 06/29/2022   Procedure: CARDIOVERSION;  Surgeon: Lamar Blinks, MD;  Location: [facility] ORS;  Service: Cardiovascular;  Laterality: N/A;   CHOLECYSTECTOMY     CIRCUMCISION N/A 06/09/2019   Procedure: CIRCUMCISION ADULT;  Surgeon: Sondra Come, MD;  Location: [facility] ORS;  Service: Urology;  Laterality: N/A;   COLONOSCOPY WITH PROPOFOL N/A 04/01/2018   Procedure: COLONOSCOPY WITH PROPOFOL;  Surgeon: Scot Jun, MD;  Location[facility] ENDOSCOPY;  Service: Endoscopy;  Laterality: N/A;   COLONOSCOPY WITH PROPOFOL N/A 05/01/2023   Procedure: COLONOSCOPY WITH PROPOFOL;  Surgeon: Toney Reil, MD;  Location: [facility] ENDOSCOPY;  Service: Gastroenterology;  Laterality: N/A;   ESOPHAGEAL DILATION     ESOPHAGOGASTRODUODENOSCOPY (EGD) WITH PROPOFOL N/A 04/01/2018   Procedure: ESOPHAGOGASTRODUODENOSCOPY (EGD) WITH  PROPOFOL;  Surgeon: Scot Jun, MD;  Location: [facility] ENDOSCOPY;  Service: Endoscopy;  Laterality: N/A;   ESOPHAGOGASTRODUODENOSCOPY (EGD) WITH PROPOFOL N/A 05/01/2023   Procedure: ESOPHAGOGASTRODUODENOSCOPY (EGD) WITH PROPOFOL;  Surgeon: Toney Reil, MD;  Location: [facility] ENDOSCOPY;  Service: Gastroenterology;  Laterality: N/A;   GIVENS CAPSULE STUDY  05/01/2023   Procedure: GIVENS CAPSULE STUDY;  Surgeon: Toney Reil, MD;  Location: [facility] ENDOSCOPY;  Service: Gastroenterology;;   LEFT HEART CATH AND CORS/GRAFTS ANGIOGRAPHY N/A 06/12/2019   Procedure: LEFT HEART CATH AND CORS/GRAFTS ANGIOGRAPHY;  Surgeon: Dalia Heading, MD;  Location: [facility] INVASIVE CV LAB;  Service: Cardiovascular;  Laterality: N/A;   LEFT HEART CATH AND CORS/GRAFTS ANGIOGRAPHY N/A 03/11/2020   Procedure: LEFT HEART CATH AND CORS/GRAFTS ANGIOGRAPHY;  Surgeon: Marcina Millard, MD;  Location: [facility] INVASIVE CV LAB;  Service: Cardiovascular;  Laterality: N/A;   LEFT HEART CATH AND CORS/GRAFTS ANGIOGRAPHY N/A 05/01/2021   Procedure: LEFT HEART CATH AND CORS/GRAFTS ANGIOGRAPHY;  Surgeon: Lamar Blinks, MD;  Location: [facility] INVASIVE CV LAB;  Service: Cardiovascular;  Laterality: N/A;   LEFT HEART CATH AND CORS/GRAFTS ANGIOGRAPHY N/A 11/02/2022   Procedure: LEFT HEART CATH AND CORS/GRAFTS ANGIOGRAPHY;  Surgeon: Alwyn Pea, MD;  Location: [facility] INVASIVE CV LAB;  Service: Cardiovascular;  Laterality: N/A;   TONSILLECTOMY     VASCULAR SURGERY      Social History Social History   Tobacco Use   Smoking status: Former    Current packs/day: 0.00    Average packs/day: 3.0 packs/day for 50.0 years (150.0 ttl pk-yrs)    Types: Cigarettes    Start date: 04/23/1963    Quit date: 04/22/2013    Years since quitting: 10.2   Smokeless tobacco: Never   Tobacco comments:    Reports not smoking for approx 8 years.  Vaping Use   Vaping  status: Never Used  Substance Use Topics   Alcohol use: No    Comment: remotely  quit alcohol use. Hx of heavy alcohol use.   Drug use: Not Currently    Types: Marijuana    Family History Family History  Problem Relation Age of Onset   Heart attack Mother    Depression Mother    Heart disease Mother    COPD Mother    Hypertension Mother    Heart attack Father    Diabetes Father    Depression Father    Heart disease Father    Cirrhosis Father    Parkinson's disease Brother     Allergies  Allergen Reactions   Prednisone Other (See Comments) and Hypertension    Pt states that this medication puts him in A-fib    Demerol  [Meperidine Hcl]    Demerol [Meperidine] Hives   Jardiance [Empagliflozin] Other (See Comments)    Perineal pain   Sulfa Antibiotics Hives   Albuterol Sulfate [Albuterol] Palpitations and Other (See Comments)    Pt currently uses this medication.     Morphine Sulfate Nausea And Vomiting, Rash and Other (See Comments)    Pt states that he is only allergic to the tablet form of this medication.       REVIEW OF SYSTEMS (Negative unless checked)  Constitutional: [] Weight loss  [] Fever  [] Chills Cardiac: [] Chest pain   [] Chest pressure   [] Palpitations   [] Shortness of breath when laying flat   [] Shortness of breath with exertion. Vascular:  [x] Pain in legs with walking   [] Pain in legs at rest  [] History of DVT   [] Phlebitis   [] Swelling in legs   [] Varicose veins   [] Non-healing ulcers Pulmonary:   [] Uses home oxygen   [] Productive cough   [] Hemoptysis   [] Wheeze  [] COPD   [] Asthma Neurologic:  [] Dizziness   [] Seizures   [] History of stroke   [] History of TIA  [] Aphasia   [] Vissual changes   [] Weakness or numbness in arm   [] Weakness or numbness in leg Musculoskeletal:   [] Joint swelling   [] Joint pain   [] Low back pain Hematologic:  [] Easy bruising  [] Easy bleeding   [] Hypercoagulable state   [] Anemic Gastrointestinal:  [] Diarrhea   [] Vomiting  [] Gastroesophageal reflux/heartburn   [] Difficulty swallowing. Genitourinary:  [] Chronic  kidney disease   [] Difficult urination  [] Frequent urination   [] Blood in urine Skin:  [] Rashes   [] Ulcers  Psychological:  [] History of anxiety   []  History of major depression.  Physical Examination  Vitals:   07/05/23 1442  BP: (!) 154/101  Pulse: 82  Resp: 16  Weight: 195 lb (88.5 kg)   Body mass index is 30.54 kg/m. Gen: WD/WN, NAD Head: [place]/AT, No temporalis wasting.  Ear/Nose/Throat: Hearing grossly intact, nares w/o erythema or drainage Eyes: PER, EOMI, sclera nonicteric.  Neck: Supple, no masses.  No bruit or JVD.  Pulmonary:  Good air movement, no audible wheezing, no use of accessory muscles.  Cardiac: RRR, normal S1, S2, no Murmurs. Vascular:  mild trophic changes, no open wounds Vessel Right Left  Radial Palpable Palpable  PT Palpable Palpable  DP Palpable Palpable  Gastrointestinal: soft, non-distended. No guarding/no peritoneal signs.  Musculoskeletal: M/S 5/5 throughout.  No visible deformity.  Neurologic: CN 2-12 intact. Pain and light touch intact in extremities.  Symmetrical.  Speech is fluent. Motor exam as listed above. Psychiatric: Judgment intact, Mood & affect appropriate for pt's clinical situation. Dermatologic: No rashes or ulcers noted.  No  changes consistent with cellulitis.   CBC Lab Results  Component Value Date   WBC 7.2 06/15/2023   HGB 12.6 (L) 06/15/2023   HCT 40.5 06/15/2023   MCV 89.2 06/15/2023   PLT 150 06/15/2023    BMET    Component Value Date/Time   NA 135 06/15/2023 2215   NA 145 (H) 12/23/2021 0834   NA 143 03/07/2015 1851   K 3.9 06/15/2023 2215   K 3.9 03/07/2015 1851   CL 102 06/15/2023 2215   CL 107 03/07/2015 1851   CO2 27 06/15/2023 2215   CO2 29 03/07/2015 1851   GLUCOSE 147 (H) 06/15/2023 2215   GLUCOSE 99 03/07/2015 1851   BUN 23 06/15/2023 2215   BUN 13 12/23/2021 0834   BUN 9 03/07/2015 1851   CREATININE 1.20 06/15/2023 2215   CREATININE 0.84 03/07/2015 1851   CALCIUM 8.7 (L) 06/15/2023 2215    CALCIUM 9.6 03/07/2015 1851   GFRNONAA >60 06/15/2023 2215   GFRNONAA >60 03/07/2015 1851   GFRAA 97 09/24/2020 1105   GFRAA >60 03/07/2015 1851   Estimated Creatinine Clearance: 61.7 mL/min (by C-G formula based on SCr of 1.2 mg/dL).  COAG Lab Results  Component Value Date   INR 1.2 05/02/2023   INR 1.3 (H) 10/31/2022   INR 1.4 (H) 06/28/2022    Radiology DG Pelvis 1-2 Views  Result Date: 06/15/2023 CLINICAL DATA:  Fall. EXAM: PELVIS - 1-2 VIEW COMPARISON:  Pelvic radiograph dated 11/02/2005. FINDINGS: No acute fracture or dislocation. Mild osteopenia and mild arthritic changes of the hips. Soft tissues are unremarkable. Vascular calcifications noted. IMPRESSION: No acute fracture or dislocation. Electronically Signed   By: Elgie Collard M.D.   On: 06/15/2023 22:55   DG Chest 1 View  Result Date: 06/15/2023 CLINICAL DATA:  Chest pain. EXAM: CHEST  1 VIEW COMPARISON:  Chest radiograph dated 06/12/2023. FINDINGS: Mild cardiomegaly with mild central vascular congestion. No focal consolidation, pleural effusion, or pneumothorax. Atherosclerotic calcification of the aorta. Median sternotomy wires and CABG vascular clips. No acute osseous pathology. IMPRESSION: Mild cardiomegaly with mild central vascular congestion. No focal consolidation. Electronically Signed   By: Elgie Collard M.D.   On: 06/15/2023 22:54   CT Maxillofacial Wo Contrast  Result Date: 06/15/2023 CLINICAL DATA:  Status post fall. EXAM: CT MAXILLOFACIAL WITHOUT CONTRAST TECHNIQUE: Multidetector CT imaging of the maxillofacial structures was performed. Multiplanar CT image reconstructions were also generated. RADIATION DOSE REDUCTION: This exam was performed according to the departmental dose-optimization program which includes automated exposure control, adjustment of the mA and/or kV according to patient size and/or use of iterative reconstruction technique. COMPARISON:  None Available. FINDINGS: Osseous: No fracture or  mandibular dislocation. No destructive process. Orbits: Negative. No traumatic or inflammatory finding. Sinuses: Clear. Soft tissues: Negative. Limited intracranial: No significant or unexpected finding. IMPRESSION: No acute facial fracture. Electronically Signed   By: Aram Candela M.D.   On: 06/15/2023 22:46   CT Cervical Spine Wo Contrast  Result Date: 06/15/2023 CLINICAL DATA:  Status post fall. EXAM: CT CERVICAL SPINE WITHOUT CONTRAST TECHNIQUE: Multidetector CT imaging of the cervical spine was performed without intravenous contrast. Multiplanar CT image reconstructions were also generated. RADIATION DOSE REDUCTION: This exam was performed according to the departmental dose-optimization program which includes automated exposure control, adjustment of the mA and/or kV according to patient size and/or use of iterative reconstruction technique. COMPARISON:  February 26, 2023 FINDINGS: Alignment: There is straightening of the normal cervical spine lordosis. Skull base and vertebrae: No  acute fracture. No primary bone lesion or focal pathologic process. Soft tissues and spinal canal: No prevertebral fluid or swelling. No visible canal hematoma. Disc levels:  Mild multilevel endplate sclerosis is seen. There is marked severity narrowing of the anterior atlantoaxial articulation. Mild multilevel intervertebral disc space narrowing is noted throughout the remainder of the cervical spine. Bilateral mild-to-moderate severity multilevel facet joint hypertrophy is noted. Upper chest: Negative. Other: Multiple sternal wires are seen. IMPRESSION: 1. No acute cervical spine fracture. 2. Multilevel degenerative changes, as described above. Electronically Signed   By: Aram Candela M.D.   On: 06/15/2023 22:43   CT Head Wo Contrast  Result Date: 06/15/2023 CLINICAL DATA:  Status post fall. EXAM: CT HEAD WITHOUT CONTRAST TECHNIQUE: Contiguous axial images were obtained from the base of the skull through the vertex  without intravenous contrast. RADIATION DOSE REDUCTION: This exam was performed according to the departmental dose-optimization program which includes automated exposure control, adjustment of the mA and/or kV according to patient size and/or use of iterative reconstruction technique. COMPARISON:  None Available. FINDINGS: Brain: There is mild cerebral atrophy with widening of the extra-axial spaces and ventricular dilatation. There are areas of decreased attenuation within the white matter tracts of the supratentorial brain, consistent with microvascular disease changes. Vascular: No hyperdense vessel or unexpected calcification. Skull: Normal. Negative for fracture or focal lesion. Sinuses/Orbits: No acute finding. Other: None. IMPRESSION: 1. Generalized cerebral atrophy with mild, chronic white matter small vessel ischemic changes. 2. No acute intracranial abnormality. Electronically Signed   By: Aram Candela M.D.   On: 06/15/2023 22:41   DG Chest Portable 1 View  Result Date: 06/12/2023 CLINICAL DATA:  67 year old male with history of sharp chest pain. EXAM: PORTABLE CHEST 1 VIEW COMPARISON:  Chest x-ray 06/08/2023. FINDINGS: Lung volumes are normal. Widespread interstitial prominence and diffuse peribronchial cuffing, similar to prior studies, likely chronic. Patchy opacities in the lung bases bilaterally (left-greater-than-right), also similar to prior studies, favored to reflect areas of chronic scarring or subsegmental atelectasis. No definite acute consolidative airspace disease. No pleural effusions. No pneumothorax. No evidence of pulmonary edema. Heart size is upper limits of normal. Upper mediastinal contours are within normal limits. Atherosclerosis in the thoracic aorta. Status post median sternotomy for CABG. Coronary artery stent also noted, likely in the region of the left anterior descending coronary artery. IMPRESSION: 1. The appearance of the lungs suggests probable chronic bronchitis.  No definite acute findings are noted. 2. Bibasilar (left-greater-than-right) areas of subsegmental atelectasis and/or scarring. 3. Aortic atherosclerosis. Electronically Signed   By: [name] M.D.   On: 06/12/2023 07:38   CT CHEST ABDOMEN PELVIS W CONTRAST  Result Date: 06/08/2023 CLINICAL DATA:  Chest pain EXAM: CT CHEST, ABDOMEN, AND PELVIS WITH CONTRAST TECHNIQUE: Multidetector CT imaging of the chest, abdomen and pelvis was performed following the standard protocol during bolus administration of intravenous contrast. RADIATION DOSE REDUCTION: This exam was performed according to the departmental dose-optimization program which includes automated exposure control, adjustment of the mA and/or kV according to patient size and/or use of iterative reconstruction technique. CONTRAST:  OMNIPAQUE IOHEXOL 300 MG/ML  SOLN COMPARISON:  Chest x-ray 06/08/2023, CT 05/14/2023, chest CT 03/28/2023, 10/07/2020 FINDINGS: CT CHEST FINDINGS Cardiovascular: Moderate aortic atherosclerosis. No aneurysm. Post CABG changes. Coronary vascular calcification. Cardiac size within normal limits. No pericardial effusion Mediastinum/Nodes: Midline trachea. No thyroid mass.mildly prominent mediastinal lymph nodes, right paratracheal lymph node measures 11 mm on series 2, image 13. Low right paratracheal lymph node measuring 17  mm on series 2, image 21. Subcarinal lymph node measuring 16 mm. Esophagus within normal limits. Lungs/Pleura: Small bilateral pleural effusions. Mild bilateral hazy pulmonary density. No consolidative airspace disease Musculoskeletal: Post sternotomy changes. No acute osseous abnormality CT ABDOMEN PELVIS FINDINGS Hepatobiliary: No focal liver abnormality is seen. Status post cholecystectomy. No biliary dilatation. Pancreas: Unremarkable. No pancreatic ductal dilatation or surrounding inflammatory changes. Spleen: Normal in size without focal abnormality. Adrenals/Urinary Tract: Adrenal glands are  unremarkable. Kidneys are normal, without renal calculi, focal lesion, or hydronephrosis. Bladder is unremarkable. Stomach/Bowel: Stomach is within normal limits. Appendix appears normal. No evidence of bowel wall thickening, distention, or inflammatory changes. Vascular/Lymphatic: Moderate aortic atherosclerosis. No aneurysm. No suspicious lymph nodes. Reproductive: Slightly enlarged prostate Other: Negative for pelvic effusion or free air. Small fat containing umbilical hernia. Small fat containing left inguinal hernia. Musculoskeletal: No acute osseous abnormality IMPRESSION: 1. Small bilateral pleural effusions. Mild bilateral hazy pulmonary density, question mild edema. 2. Mildly enlarged mediastinal lymph nodes stable compared with recent priors, increased in size compared with 2021. These could be infectious, inflammatory, or neoplastic in etiology if there is appropriate clinical history. 3. No CT evidence for acute intra-abdominal or pelvic abnormality. 4. Aortic atherosclerosis. Aortic Atherosclerosis (ICD10-I70.0). Electronically Signed   By: Jasmine Pang M.D.   On: 06/08/2023 20:27   DG Chest Portable 1 View  Result Date: 06/08/2023 CLINICAL DATA:  Chest pain. EXAM: PORTABLE CHEST 1 VIEW COMPARISON:  Chest radiograph dated 06/01/2023. FINDINGS: There is cardiomegaly with vascular congestion and interstitial edema. No focal consolidation, pleural effusion or pneumothorax. Median sternotomy wires and CABG vascular clips. Atherosclerotic calcification of the aorta. No acute osseous pathology. IMPRESSION: Cardiomegaly with findings of CHF. Electronically Signed   By: Elgie Collard M.D.   On: 06/08/2023 19:32   DG Lumbar Spine 2-3 Views  Result Date: 06/07/2023 CLINICAL DATA:  Fall, back pain EXAM: LUMBAR SPINE - 2-3 VIEW COMPARISON:  None Available. FINDINGS: Degenerative facet disease in the mid to lower lumbar spine. Slight anterolisthesis of L4 on L5. Disc spaces maintained. No fracture. SI  joints symmetric and unremarkable. 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 No acute bony abnormality. Electronically Signed   By: Charlett Nose M.D.   On: 06/07/2023 22:13   DG Thoracic Spine 2 View  Result Date: 06/07/2023 CLINICAL DATA:  Back pain EXAM: THORACIC SPINE 2 VIEWS COMPARISON:  Chest x-ray 02/21/2023 FINDINGS: Thoracic alignment within normal limits. Moderate mid to lower thoracic degenerative osteophyte. Stable vertebral body heights. IMPRESSION: Moderate degenerative changes. Electronically Signed   By: Jasmine Pang M.D.   On: 06/07/2023 21:18     Assessment/Plan 1. PAD (peripheral artery disease) (HCC) Recommend:  I do not find evidence of life style limiting vascular disease. The patient specifically denies life style limitation.  Previous noninvasive studies including ABI's of the legs do not identify critical vascular problems.  The patient should continue walking and begin a more formal exercise program. The patient should continue his antiplatelet therapy and aggressive treatment of the lipid abnormalities.  The patient is instructed to call the office if there is a significant change in the lower extremity symptoms, particularly if a wound develops or there is an abrupt increase in leg pain.  Patient will follow-up with me in one year  - VAS [place] ABI WITH/WO TBI; Future  2. Primary hypertension Continue antihypertensive medications as already ordered, these medications have been reviewed and there are no changes at this time.  3.  Coronary artery disease of native artery of native heart with stable angina pectoris (HCC) Continue cardiac and antihypertensive medications as already ordered and reviewed, no changes at this time.  Continue statin as ordered and reviewed, no changes at this time  Nitrates PRN for chest pain  4. Paroxysmal atrial fibrillation (HCC) Continue antiarrhythmia medications as already ordered, these  medications have been reviewed and there are no changes at this time.  Continue anticoagulation as ordered by Cardiology Service  5. Chronic obstructive pulmonary disease, unspecified COPD type (HCC) Continue pulmonary medications and aerosols as already ordered, these medications have been reviewed and there are no changes at this time.   6. Type 2 diabetes mellitus with diabetic nephropathy, without long-term current use of insulin (HCC) Continue hypoglycemic medications as already ordered, these medications have been reviewed and there are no changes at this time.  Hgb A1C to be monitored as already arranged by primary service    Levora Dredge, MD  07/05/2023 3:01 PM

## 2023-07-07 NOTE — Progress Notes (Deleted)
Celso Amy, PA-C [address]  [address]  [place], [place] [address]  Main: [phone]  Fax: [phone]   Primary Care Physician: Malva Limes, MD  Primary Gastroenterologist:  ***  CC: F/U H. Pylori, small bowel AVMs, constipation, GERD  HPI: Lawrence [name] is a 67 y.o. male Here for 1 month F/U symptomatic Iron Deficiency Anemia.  Hospitalized 6/11 until 05/04/23 for Anemia and weakness.  Med hx of Afib (takes Eliquis), CAD s/p STEMI, CHF, COPD, Diabetes, oxygen dependent.  1 month ago he was started on treatment for H. pylori.  Also started on MiraLAX for constipation.  Continues oral iron and pantoprazole daily.  Current Symptoms: Unfortunately, he has had 3 ED visits in the past month for chest pain and falls.  Troponin has been elevated.  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  Managed with medication.  Not candidate for any further procedures.  He was encouraged to continue pantoprazole for acid reflux.  Last labs 06/15/2023 showed stable hemoglobin 12.6, hematocrit 40, MCV 89, platelet 150.   EGD done 05/01/23 by Dr. Allegra Lai showed mild gastritis, otherwise normal.  Gastric biopsies positive for H. Pylori (few organisms).  He was started on treatment for H. pylori 06/08/2023: Amoxicillin 1 g twice daily, Levaquin 500 mg daily, pantoprazole 40 Mg twice daily for 14 days.   Colonoscopy 05/01/23 showed poor prep.  No polyps or biopsies.   Capsule Endoscopy 05/01/23 showed possible 2 small AVM's in proximal to mid small bowel.    CT Abd / Pelvis 05/14/23: 1. Distal esophageal and gastroesophageal junction wall thickening, diverticulosis but no diverticulitis.  Tiny inguinal hernia.      Current Outpatient Medications  Medication Sig Dispense Refill   Accu-Chek Softclix Lancets lancets Use as instructed to check sugar daily for type 2 diabetes. 100 each 4   acetaminophen (TYLENOL)  325 MG tablet Take 2 tablets (650 mg total) by mouth every 4 (four) hours as needed for headache or mild pain.     albuterol (VENTOLIN HFA) 108 (90 Base) MCG/ACT inhaler INHALE 2 PUFFS BY MOUTH EVERY 6 HOURS AS NEEDED FOR SHORTNESS OF BREATH (Patient not taking: Reported on 06/16/2023) 17 g 5   allopurinol (ZYLOPRIM) 300 MG tablet TAKE 1 TABLET BY MOUTH TWICE A DAY 180 tablet 4   ALPRAZolam (XANAX) 1 MG tablet Take 1 tablet (1 mg total) by mouth 3 (three) times daily as needed. 30 tablet 0   amiodarone (PACERONE) 200 MG tablet Take 1 tablet (200 mg total) by mouth daily. 30 tablet 0   atorvastatin (LIPITOR) 80 MG tablet Take 1 tablet (80 mg total) by mouth daily. Please schedule office visit before any future refill. 90 tablet 0   Blood Glucose Calibration (ACCU-CHEK GUIDE CONTROL) LIQD Use with blood glucose monitor as directed 1 each 3   Blood Glucose Monitoring Suppl (ACCU-CHEK GUIDE) w/Device KIT Use to check blood sugars as directed 1 kit 0   Budeson-Glycopyrrol-Formoterol (BREZTRI AEROSPHERE) 160-9-4.8 MCG/ACT AERO Inhale 2 puffs into the lungs 2 (two) times daily. 10.7 g 11   celecoxib (CELEBREX) 200 MG capsule Take 1 capsule (200 mg total) by mouth 2 (two) times daily as needed. Home med.     cetirizine (ZYRTEC) 10 MG tablet TAKE 1 TABLET BY MOUTH AT BEDTIME 90 tablet 1   clopidogrel (PLAVIX) 75 MG tablet Take 1 tablet (75 mg total) by mouth daily. (Patient  not taking: Reported on 06/16/2023) 30 tablet 0   dicyclomine (BENTYL) 10 MG capsule Take 1 capsule (10 mg total) by mouth 3 (three) times daily as needed (abdominal pain). (Patient not taking: Reported on 06/16/2023) 15 capsule 0   ELIQUIS 5 MG TABS tablet TAKE 1 TABLET BY MOUTH TWICE A DAY 180 tablet 3   FARXIGA 10 MG TABS tablet Take 10 mg by mouth daily.     glipiZIDE (GLUCOTROL) 5 MG tablet Take 1 tablet (5 mg total) by mouth daily as needed (sugar over 300). 30 tablet 0   glucose blood (ACCU-CHEK GUIDE) test strip Use as instructed to  check sugar daily for type 2 diabetes. 100 strip 4   iron polysaccharides (NIFEREX) 150 MG capsule Take 1 capsule (150 mg total) by mouth daily. 30 capsule 1   isosorbide mononitrate (IMDUR) 120 MG 24 hr tablet Take 1 tablet (120 mg total) by mouth daily. 30 tablet 0   lisinopril (ZESTRIL) 20 MG tablet Take 20 mg by mouth daily.     meclizine (ANTIVERT) 25 MG tablet TAKE ONE TABLET BY THREE TIMES A DAY AS NEEDED FOR DIZZINESS 30 tablet 5   metFORMIN (GLUCOPHAGE-XR) 500 MG 24 hr tablet TAKE 1 TABLET BY MOUTH TWICE A DAY 60 tablet 3   methocarbamol (ROBAXIN) 500 MG tablet Take 500 mg by mouth every 8 (eight) hours as needed for muscle spasms. Take 1 tablet by mouth every hour as needed for muscle spasms     metoprolol succinate (TOPROL-XL) 25 MG 24 hr tablet Take 0.5 tablets (12.5 mg total) by mouth daily. 30 tablet 0   nitroGLYCERIN (NITROSTAT) 0.4 MG SL tablet Place 1 tablet (0.4 mg total) under the tongue every 5 (five) minutes as needed for chest pain. 30 tablet 0   omega-3 acid ethyl esters (LOVAZA) 1 g capsule Take 4 capsules (4 g total) by mouth daily. 120 capsule 3   ondansetron (ZOFRAN) 4 MG tablet Take 1 tablet (4 mg total) by mouth every 8 (eight) hours as needed. 20 tablet 0   oxyCODONE (ROXICODONE) 5 MG immediate release tablet Take 1 tablet (5 mg total) by mouth every 8 (eight) hours as needed. 18 tablet 0   oxyCODONE-acetaminophen (PERCOCET) 10-325 MG tablet Take 1 tablet by mouth every 4 (four) hours as needed. for pain 12 tablet 0   pantoprazole (PROTONIX) 40 MG tablet Take 1 tablet (40 mg total) by mouth 2 (two) times daily for 14 days. 28 tablet 0   ranolazine (RANEXA) 1000 MG SR tablet Take 1 tablet (1,000 mg total) by mouth 2 (two) times daily. 60 tablet 0   torsemide (DEMADEX) 100 MG tablet Take 0.5 tablets (50 mg total) by mouth daily. 30 tablet 0   traZODone (DESYREL) 150 MG tablet TAKE 1 TABLET BY MOUTH AT BEDTIME 90 tablet 4   venlafaxine XR (EFFEXOR-XR) 75 MG 24 hr capsule  TAKE 1 CAPSULE BY MOUTH DAILY WITH BREAKFAST 90 capsule 4   No current facility-administered medications for this visit.    Allergies as of 07/08/2023 - Review Complete 07/05/2023  Allergen Reaction Noted   Prednisone Other (See Comments) and Hypertension 10/20/2015   Demerol  [meperidine hcl]  05/25/2019   Demerol [meperidine] Hives 03/22/2015   Jardiance [empagliflozin] Other (See Comments) 04/26/2018   Sulfa antibiotics Hives 03/22/2015   Albuterol sulfate [albuterol] Palpitations and Other (See Comments) 07/03/2015   Morphine sulfate Nausea And Vomiting, Rash, and Other (See Comments) 07/03/2015    Past Medical History:  Diagnosis Date  A-fib (HCC)    Anemia    Anginal pain (HCC)    Anxiety    Arthritis    Asthma    CAD (coronary artery disease)    a. 2002 CABGx2 (LIMA->LAD, VG->VG->OM1);  b. 09/2012 DES->OM;  c. 03/2015 PTCA of LAD [facility]) in setting of atretic LIMA; d. 05/2015 Cath [facility]): nonobs dzs; e. 06/2015 Cath ([facility]): LM nl, LAD 45p/d ISR, 50d, D1/2 small, LCX 50p/d ISR, OM1 70ost, 30 ISR, VG->OM1 50ost, 93m, LIMA->LAD 99p/d - atretic, RCA dom, nl; f.cath 10/16: 40-50%(FFR 0.90) pLAD, 75% (FFR 0.77) mLAD s/p PCI/DES, oRCA 40% (FFR0.95)   Cancer (HCC)    SKIN CANCER ON BACK   Celiac disease    Chronic diastolic CHF (congestive heart failure) (HCC)    a. 06/2009 Echo: EF 60-65%, Gr 1 DD, triv AI, mildly dil LA, nl RV.   COPD (chronic obstructive pulmonary disease) (HCC)    a. Chronic bronchitis and emphysema.   DDD (degenerative disc disease), lumbar    Diverticulosis    Dysrhythmia    Essential hypertension    GERD (gastroesophageal reflux disease)    History of hiatal hernia    History of kidney stones    H/O   History of tobacco abuse    a. Quit 2014.   Myocardial infarction [facility]) 2002   4 STENTS   Pancreatitis    PSVT (paroxysmal supraventricular tachycardia)    a. 10/2012 Noted on Zio Patch.   Sleep apnea    LOST CORD TO CPAP -ONLY 02 @ BEDTIME   Tubular  adenoma of colon    Type II diabetes mellitus (HCC)     Past Surgical History:  Procedure Laterality Date   BYPASS GRAFT     CARDIAC CATHETERIZATION N/A 07/12/2015   rocedure: Left Heart Cath and Cors/Grafts Angiography;  Surgeon: Lyn Records, MD;  Location: [facility] INVASIVE CV LAB;  Service: Cardiovascular;  Laterality: N/A;   CARDIAC CATHETERIZATION Right 10/07/2015   Procedure: Left Heart Cath and Cors/Grafts Angiography;  Surgeon: Laurier Nancy, MD;  Location: [facility] INVASIVE CV LAB;  Service: Cardiovascular;  Laterality: Right;   CARDIAC CATHETERIZATION N/A 04/06/2016   Procedure: Left Heart Cath and Coronary Angiography;  Surgeon: Alwyn Pea, MD;  Location: [facility] INVASIVE CV LAB;  Service: Cardiovascular;  Laterality: N/A;   CARDIAC CATHETERIZATION  04/06/2016   Procedure: Bypass Graft Angiography;  Surgeon: Alwyn Pea, MD;  Location: [facility] INVASIVE CV LAB;  Service: Cardiovascular;;   CARDIAC CATHETERIZATION N/A 11/02/2016   Procedure: Left Heart Cath and Cors/Grafts Angiography and possible PCI;  Surgeon: Alwyn Pea, MD;  Location: [facility] INVASIVE CV LAB;  Service: Cardiovascular;  Laterality: N/A;   CARDIAC CATHETERIZATION N/A 11/02/2016   Procedure: Coronary Stent Intervention;  Surgeon: Alwyn Pea, MD;  Location: [facility] INVASIVE CV LAB;  Service: Cardiovascular;  Laterality: N/A;   CARDIOVERSION N/A 06/29/2022   Procedure: CARDIOVERSION;  Surgeon: Lamar Blinks, MD;  Location: [facility] ORS;  Service: Cardiovascular;  Laterality: N/A;   CHOLECYSTECTOMY     CIRCUMCISION N/A 06/09/2019   Procedure: CIRCUMCISION ADULT;  Surgeon: Sondra Come, MD;  Location: [facility] ORS;  Service: Urology;  Laterality: N/A;   COLONOSCOPY WITH PROPOFOL N/A 04/01/2018   Procedure: COLONOSCOPY WITH PROPOFOL;  Surgeon: Scot Jun, MD;  Location: [facility] ENDOSCOPY;  Service: Endoscopy;  Laterality: N/A;   COLONOSCOPY WITH PROPOFOL N/A 05/01/2023   Procedure: COLONOSCOPY WITH PROPOFOL;   Surgeon: Toney Reil, MD;  Location: [facility] ENDOSCOPY;  Service: Gastroenterology;  Laterality: N/A;   ESOPHAGEAL DILATION     ESOPHAGOGASTRODUODENOSCOPY (EGD) WITH PROPOFOL N/A 04/01/2018   Procedure: ESOPHAGOGASTRODUODENOSCOPY (EGD) WITH PROPOFOL;  Surgeon: Scot Jun, MD;  Location: [facility] ENDOSCOPY;  Service: Endoscopy;  Laterality: N/A;   ESOPHAGOGASTRODUODENOSCOPY (EGD) WITH PROPOFOL N/A 05/01/2023   Procedure: ESOPHAGOGASTRODUODENOSCOPY (EGD) WITH PROPOFOL;  Surgeon: Toney Reil, MD;  Location: [facility] ENDOSCOPY;  Service: Gastroenterology;  Laterality: N/A;   GIVENS CAPSULE STUDY  05/01/2023   Procedure: GIVENS CAPSULE STUDY;  Surgeon: Toney Reil, MD;  Location: [facility] ENDOSCOPY;  Service: Gastroenterology;;   LEFT HEART CATH AND CORS/GRAFTS ANGIOGRAPHY N/A 06/12/2019   Procedure: LEFT HEART CATH AND CORS/GRAFTS ANGIOGRAPHY;  Surgeon: Dalia Heading, MD;  Location: [facility] INVASIVE CV LAB;  Service: Cardiovascular;  Laterality: N/A;   LEFT HEART CATH AND CORS/GRAFTS ANGIOGRAPHY N/A 03/11/2020   Procedure: LEFT HEART CATH AND CORS/GRAFTS ANGIOGRAPHY;  Surgeon: Marcina Millard, MD;  Location: [facility] INVASIVE CV LAB;  Service: Cardiovascular;  Laterality: N/A;   LEFT HEART CATH AND CORS/GRAFTS ANGIOGRAPHY N/A 05/01/2021   Procedure: LEFT HEART CATH AND CORS/GRAFTS ANGIOGRAPHY;  Surgeon: Lamar Blinks, MD;  Location: [facility] INVASIVE CV LAB;  Service: Cardiovascular;  Laterality: N/A;   LEFT HEART CATH AND CORS/GRAFTS ANGIOGRAPHY N/A 11/02/2022   Procedure: LEFT HEART CATH AND CORS/GRAFTS ANGIOGRAPHY;  Surgeon: Alwyn Pea, MD;  Location: [facility] INVASIVE CV LAB;  Service: Cardiovascular;  Laterality: N/A;   TONSILLECTOMY     VASCULAR SURGERY      Review of Systems:    All systems reviewed and negative except where noted in HPI.   Physical Examination:   There were no vitals taken for this visit.  General: Well-nourished, well-developed in no acute distress.   Lungs: Clear to auscultation bilaterally. Non-labored. Heart: Regular rate and rhythm, no murmurs rubs or gallops.  Abdomen: Bowel sounds are normal; Abdomen is Soft; No hepatosplenomegaly, masses or hernias;  No Abdominal Tenderness; No guarding or rebound tenderness. Neuro: Alert and oriented x 3.  Grossly intact.  Psych: Alert and cooperative, normal mood and affect.   Imaging Studies: VAS [place] ABI WITH/WO TBI  Result Date: 07/05/2023  LOWER EXTREMITY DOPPLER STUDY Patient Name:  Lawrence [name]  Date of Exam:   07/05/2023 Medical Rec #: [id number]        Accession #:    [id number] Date of Birth: 1956/04/10        Patient Gender: M Patient Age:   30 years Exam Location:  [facility] Procedure:      VAS [place] ABI WITH/WO TBI Referring Phys: --------------------------------------------------------------------------------  Indications: Rest pain. High Risk Factors: Diabetes.  Comparison Study: Screening at PCP suggested abnormal flow left leg Performing Technologist: Salvadore Farber RVT  Examination Guidelines: A complete evaluation includes at minimum, Doppler waveform signals and systolic blood pressure reading at the level of bilateral brachial, anterior tibial, and posterior tibial arteries, when vessel segments are accessible. Bilateral testing is considered an integral part of a complete examination. Photoelectric Plethysmograph (PPG) waveforms and toe systolic pressure readings are included as required and additional duplex testing as needed. Limited examinations for reoccurring indications may be performed as noted.  ABI Findings: +---------+------------------+-----+---------+--------+ Right    Rt Pressure (mmHg)IndexWaveform Comment  +---------+------------------+-----+---------+--------+ Brachial 167                                      +---------+------------------+-----+---------+--------+ ATA  189               1.13 triphasic          +---------+------------------+-----+---------+--------+ PTA      184               1.10 triphasic         +---------+------------------+-----+---------+--------+ Great Toe137               0.82 Normal            +---------+------------------+-----+---------+--------+ +---------+------------------+-----+---------+-------+ Left     Lt Pressure (mmHg)IndexWaveform Comment +---------+------------------+-----+---------+-------+ Brachial 165                                     +---------+------------------+-----+---------+-------+ ATA      184               1.10 biphasic         +---------+------------------+-----+---------+-------+ PTA      173               1.04 triphasic        +---------+------------------+-----+---------+-------+ Great Toe146               0.87 Normal           +---------+------------------+-----+---------+-------+  Summary: Right: Resting right ankle-brachial index is within normal range. The right toe-brachial index is normal. Left: Resting left ankle-brachial index is within normal range. The left toe-brachial index is normal. *See table(s) above for measurements and observations.  Electronically signed by Levora Dredge MD on 07/05/2023 at 5:47:27 PM.    Final    DG Pelvis 1-2 Views  Result Date: 06/15/2023 CLINICAL DATA:  Fall. EXAM: PELVIS - 1-2 VIEW COMPARISON:  Pelvic radiograph dated 11/02/2005. FINDINGS: No acute fracture or dislocation. Mild osteopenia and mild arthritic changes of the hips. Soft tissues are unremarkable. Vascular calcifications noted. IMPRESSION: No acute fracture or dislocation. Electronically Signed   By: Elgie Collard M.D.   On: 06/15/2023 22:55   DG Chest 1 View  Result Date: 06/15/2023 CLINICAL DATA:  Chest pain. EXAM: CHEST  1 VIEW COMPARISON:  Chest radiograph dated 06/12/2023. FINDINGS: Mild cardiomegaly with mild central vascular congestion. No focal consolidation, pleural effusion, or pneumothorax.  Atherosclerotic calcification of the aorta. Median sternotomy wires and CABG vascular clips. No acute osseous pathology. IMPRESSION: Mild cardiomegaly with mild central vascular congestion. No focal consolidation. Electronically Signed   By: Elgie Collard M.D.   On: 06/15/2023 22:54   CT Maxillofacial Wo Contrast  Result Date: 06/15/2023 CLINICAL DATA:  Status post fall. EXAM: CT MAXILLOFACIAL WITHOUT CONTRAST TECHNIQUE: Multidetector CT imaging of the maxillofacial structures was performed. Multiplanar CT image reconstructions were also generated. RADIATION DOSE REDUCTION: This exam was performed according to the departmental dose-optimization program which includes automated exposure control, adjustment of the mA and/or kV according to patient size and/or use of iterative reconstruction technique. COMPARISON:  None Available. FINDINGS: Osseous: No fracture or mandibular dislocation. No destructive process. Orbits: Negative. No traumatic or inflammatory finding. Sinuses: Clear. Soft tissues: Negative. Limited intracranial: No significant or unexpected finding. IMPRESSION: No acute facial fracture. Electronically Signed   By: Aram Candela M.D.   On: 06/15/2023 22:46   CT Cervical Spine Wo Contrast  Result Date: 06/15/2023 CLINICAL DATA:  Status post fall. EXAM: CT CERVICAL SPINE WITHOUT CONTRAST TECHNIQUE: Multidetector CT imaging of the cervical spine was performed without intravenous contrast. Multiplanar CT image  reconstructions were also generated. RADIATION DOSE REDUCTION: This exam was performed according to the departmental dose-optimization program which includes automated exposure control, adjustment of the mA and/or kV according to patient size and/or use of iterative reconstruction technique. COMPARISON:  February 26, 2023 FINDINGS: Alignment: There is straightening of the normal cervical spine lordosis. Skull base and vertebrae: No acute fracture. No primary bone lesion or focal pathologic  process. Soft tissues and spinal canal: No prevertebral fluid or swelling. No visible canal hematoma. Disc levels:  Mild multilevel endplate sclerosis is seen. There is marked severity narrowing of the anterior atlantoaxial articulation. Mild multilevel intervertebral disc space narrowing is noted throughout the remainder of the cervical spine. Bilateral mild-to-moderate severity multilevel facet joint hypertrophy is noted. Upper chest: Negative. Other: Multiple sternal wires are seen. IMPRESSION: 1. No acute cervical spine fracture. 2. Multilevel degenerative changes, as described above. Electronically Signed   By: Aram Candela M.D.   On: 06/15/2023 22:43   CT Head Wo Contrast  Result Date: 06/15/2023 CLINICAL DATA:  Status post fall. EXAM: CT HEAD WITHOUT CONTRAST TECHNIQUE: Contiguous axial images were obtained from the base of the skull through the vertex without intravenous contrast. RADIATION DOSE REDUCTION: This exam was performed according to the departmental dose-optimization program which includes automated exposure control, adjustment of the mA and/or kV according to patient size and/or use of iterative reconstruction technique. COMPARISON:  None Available. FINDINGS: Brain: There is mild cerebral atrophy with widening of the extra-axial spaces and ventricular dilatation. There are areas of decreased attenuation within the white matter tracts of the supratentorial brain, consistent with microvascular disease changes. Vascular: No hyperdense vessel or unexpected calcification. Skull: Normal. Negative for fracture or focal lesion. Sinuses/Orbits: No acute finding. Other: None. IMPRESSION: 1. Generalized cerebral atrophy with mild, chronic white matter small vessel ischemic changes. 2. No acute intracranial abnormality. Electronically Signed   By: Aram Candela M.D.   On: 06/15/2023 22:41   DG Chest Portable 1 View  Result Date: 06/12/2023 CLINICAL DATA:  67 year old male with history of  sharp chest pain. EXAM: PORTABLE CHEST 1 VIEW COMPARISON:  Chest x-ray 06/08/2023. FINDINGS: Lung volumes are normal. Widespread interstitial prominence and diffuse peribronchial cuffing, similar to prior studies, likely chronic. Patchy opacities in the lung bases bilaterally (left-greater-than-right), also similar to prior studies, favored to reflect areas of chronic scarring or subsegmental atelectasis. No definite acute consolidative airspace disease. No pleural effusions. No pneumothorax. No evidence of pulmonary edema. Heart size is upper limits of normal. Upper mediastinal contours are within normal limits. Atherosclerosis in the thoracic aorta. Status post median sternotomy for CABG. Coronary artery stent also noted, likely in the region of the left anterior descending coronary artery. IMPRESSION: 1. The appearance of the lungs suggests probable chronic bronchitis. No definite acute findings are noted. 2. Bibasilar (left-greater-than-right) areas of subsegmental atelectasis and/or scarring. 3. Aortic atherosclerosis. Electronically Signed   By: [name] M.D.   On: 06/12/2023 07:38   CT CHEST ABDOMEN PELVIS W CONTRAST  Result Date: 06/08/2023 CLINICAL DATA:  Chest pain EXAM: CT CHEST, ABDOMEN, AND PELVIS WITH CONTRAST TECHNIQUE: Multidetector CT imaging of the chest, abdomen and pelvis was performed following the standard protocol during bolus administration of intravenous contrast. RADIATION DOSE REDUCTION: This exam was performed according to the departmental dose-optimization program which includes automated exposure control, adjustment of the mA and/or kV according to patient size and/or use of iterative reconstruction technique. CONTRAST:  OMNIPAQUE IOHEXOL 300 MG/ML  SOLN COMPARISON:  Chest x-ray 06/08/2023,  CT 05/14/2023, chest CT 03/28/2023, 10/07/2020 FINDINGS: CT CHEST FINDINGS Cardiovascular: Moderate aortic atherosclerosis. No aneurysm. Post CABG changes. Coronary vascular  calcification. Cardiac size within normal limits. No pericardial effusion Mediastinum/Nodes: Midline trachea. No thyroid mass.mildly prominent mediastinal lymph nodes, right paratracheal lymph node measures 11 mm on series 2, image 13. Low right paratracheal lymph node measuring 17 mm on series 2, image 21. Subcarinal lymph node measuring 16 mm. Esophagus within normal limits. Lungs/Pleura: Small bilateral pleural effusions. Mild bilateral hazy pulmonary density. No consolidative airspace disease Musculoskeletal: Post sternotomy changes. No acute osseous abnormality CT ABDOMEN PELVIS FINDINGS Hepatobiliary: No focal liver abnormality is seen. Status post cholecystectomy. No biliary dilatation. Pancreas: Unremarkable. No pancreatic ductal dilatation or surrounding inflammatory changes. Spleen: Normal in size without focal abnormality. Adrenals/Urinary Tract: Adrenal glands are unremarkable. Kidneys are normal, without renal calculi, focal lesion, or hydronephrosis. Bladder is unremarkable. Stomach/Bowel: Stomach is within normal limits. Appendix appears normal. No evidence of bowel wall thickening, distention, or inflammatory changes. Vascular/Lymphatic: Moderate aortic atherosclerosis. No aneurysm. No suspicious lymph nodes. Reproductive: Slightly enlarged prostate Other: Negative for pelvic effusion or free air. Small fat containing umbilical hernia. Small fat containing left inguinal hernia. Musculoskeletal: No acute osseous abnormality IMPRESSION: 1. Small bilateral pleural effusions. Mild bilateral hazy pulmonary density, question mild edema. 2. Mildly enlarged mediastinal lymph nodes stable compared with recent priors, increased in size compared with 2021. These could be infectious, inflammatory, or neoplastic in etiology if there is appropriate clinical history. 3. No CT evidence for acute intra-abdominal or pelvic abnormality. 4. Aortic atherosclerosis. Aortic Atherosclerosis (ICD10-I70.0). Electronically  Signed   By: Jasmine Pang M.D.   On: 06/08/2023 20:27   DG Chest Portable 1 View  Result Date: 06/08/2023 CLINICAL DATA:  Chest pain. EXAM: PORTABLE CHEST 1 VIEW COMPARISON:  Chest radiograph dated 06/01/2023. FINDINGS: There is cardiomegaly with vascular congestion and interstitial edema. No focal consolidation, pleural effusion or pneumothorax. Median sternotomy wires and CABG vascular clips. Atherosclerotic calcification of the aorta. No acute osseous pathology. IMPRESSION: Cardiomegaly with findings of CHF. Electronically Signed   By: Elgie Collard M.D.   On: 06/08/2023 19:32   DG Lumbar Spine 2-3 Views  Result Date: 06/07/2023 CLINICAL DATA:  Fall, back pain EXAM: LUMBAR SPINE - 2-3 VIEW COMPARISON:  None Available. FINDINGS: Degenerative facet disease in the mid to lower lumbar spine. Slight anterolisthesis of L4 on L5. Disc spaces maintained. No fracture. SI joints symmetric and unremarkable. 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 No acute bony abnormality. Electronically Signed   By: Charlett Nose M.D.   On: 06/07/2023 22:13   DG Thoracic Spine 2 View  Result Date: 06/07/2023 CLINICAL DATA:  Back pain EXAM: THORACIC SPINE 2 VIEWS COMPARISON:  Chest x-ray 02/21/2023 FINDINGS: Thoracic alignment within normal limits. Moderate mid to lower thoracic degenerative osteophyte. Stable vertebral body heights. IMPRESSION: Moderate degenerative changes. Electronically Signed   By: Jasmine Pang M.D.   On: 06/07/2023 21:18    Assessment and Plan:   Lawrence [name] is a 67 y.o. y/o male returns for 1 month followup of:  1.  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  Hemoglobin stable.             Lab:   CBC and iron panel             Continue oral iron once daily.     2.  GERD with gastritis and esophagitis  Increase pantoprazole to 40 Mg twice daily?   3.  H. pylori gastritis: Treated with amoxicillin /  Levaquin 500mg  every day / Pantoprazole for 14 days.   F/U H. Pylori Test off PPI x 2 wk?  Patient unable to hold PPI for H. pylori test.  4.  Chronic constipation             Continue MiraLAX 1 capful in a drink once daily.    5.  Multiple comorbidities with severe COPD and CAD.  Oxygen dependent.    Celso Amy, PA-C  Follow up with Dr. Allegra Lai as needed.  BP check ***

## 2023-07-08 ENCOUNTER — Ambulatory Visit: Payer: Medicare HMO | Admitting: Physician Assistant

## 2023-07-08 ENCOUNTER — Other Ambulatory Visit: Payer: Self-pay | Admitting: Physician Assistant

## 2023-07-08 ENCOUNTER — Other Ambulatory Visit: Payer: Self-pay | Admitting: Family Medicine

## 2023-07-09 ENCOUNTER — Emergency Department
Admission: EM | Admit: 2023-07-09 | Discharge: 2023-07-09 | Disposition: A | Discharge status: Home / Self Care | Payer: Medicare HMO | Source: Home / Self Care | Attending: Emergency Medicine | Admitting: Emergency Medicine

## 2023-07-09 ENCOUNTER — Other Ambulatory Visit: Payer: Self-pay

## 2023-07-09 ENCOUNTER — Emergency Department: Payer: Medicare HMO

## 2023-07-09 DIAGNOSIS — R079 Chest pain, unspecified: Secondary | ICD-10-CM | POA: Diagnosis present

## 2023-07-09 DIAGNOSIS — E1122 Type 2 diabetes mellitus with diabetic chronic kidney disease: Secondary | ICD-10-CM | POA: Insufficient documentation

## 2023-07-09 DIAGNOSIS — J449 Chronic obstructive pulmonary disease, unspecified: Secondary | ICD-10-CM | POA: Diagnosis not present

## 2023-07-09 DIAGNOSIS — R0789 Other chest pain: Secondary | ICD-10-CM

## 2023-07-09 DIAGNOSIS — E1165 Type 2 diabetes mellitus with hyperglycemia: Secondary | ICD-10-CM | POA: Diagnosis present

## 2023-07-09 DIAGNOSIS — R918 Other nonspecific abnormal finding of lung field: Secondary | ICD-10-CM | POA: Diagnosis not present

## 2023-07-09 DIAGNOSIS — M109 Gout, unspecified: Secondary | ICD-10-CM | POA: Diagnosis present

## 2023-07-09 DIAGNOSIS — I480 Paroxysmal atrial fibrillation: Secondary | ICD-10-CM | POA: Diagnosis present

## 2023-07-09 DIAGNOSIS — Z79899 Other long term (current) drug therapy: Secondary | ICD-10-CM | POA: Diagnosis not present

## 2023-07-09 DIAGNOSIS — R7989 Other specified abnormal findings of blood chemistry: Secondary | ICD-10-CM | POA: Insufficient documentation

## 2023-07-09 DIAGNOSIS — I130 Hypertensive heart and chronic kidney disease with heart failure and stage 1 through stage 4 chronic kidney disease, or unspecified chronic kidney disease: Secondary | ICD-10-CM | POA: Diagnosis present

## 2023-07-09 DIAGNOSIS — B342 Coronavirus infection, unspecified: Secondary | ICD-10-CM | POA: Diagnosis not present

## 2023-07-09 DIAGNOSIS — I25709 Atherosclerosis of coronary artery bypass graft(s), unspecified, with unspecified angina pectoris: Secondary | ICD-10-CM | POA: Diagnosis not present

## 2023-07-09 DIAGNOSIS — N179 Acute kidney failure, unspecified: Secondary | ICD-10-CM | POA: Diagnosis present

## 2023-07-09 DIAGNOSIS — J9621 Acute and chronic respiratory failure with hypoxia: Secondary | ICD-10-CM | POA: Diagnosis present

## 2023-07-09 DIAGNOSIS — Z7984 Long term (current) use of oral hypoglycemic drugs: Secondary | ICD-10-CM | POA: Diagnosis not present

## 2023-07-09 DIAGNOSIS — E785 Hyperlipidemia, unspecified: Secondary | ICD-10-CM | POA: Diagnosis present

## 2023-07-09 DIAGNOSIS — Z7901 Long term (current) use of anticoagulants: Secondary | ICD-10-CM | POA: Diagnosis not present

## 2023-07-09 DIAGNOSIS — Z951 Presence of aortocoronary bypass graft: Secondary | ICD-10-CM | POA: Insufficient documentation

## 2023-07-09 DIAGNOSIS — I5032 Chronic diastolic (congestive) heart failure: Secondary | ICD-10-CM | POA: Diagnosis present

## 2023-07-09 DIAGNOSIS — J984 Other disorders of lung: Secondary | ICD-10-CM | POA: Diagnosis not present

## 2023-07-09 DIAGNOSIS — I509 Heart failure, unspecified: Secondary | ICD-10-CM | POA: Insufficient documentation

## 2023-07-09 DIAGNOSIS — J1282 Pneumonia due to coronavirus disease 2019: Secondary | ICD-10-CM | POA: Diagnosis present

## 2023-07-09 DIAGNOSIS — E119 Type 2 diabetes mellitus without complications: Secondary | ICD-10-CM | POA: Diagnosis not present

## 2023-07-09 DIAGNOSIS — N182 Chronic kidney disease, stage 2 (mild): Secondary | ICD-10-CM | POA: Diagnosis present

## 2023-07-09 DIAGNOSIS — I2489 Other forms of acute ischemic heart disease: Secondary | ICD-10-CM | POA: Diagnosis present

## 2023-07-09 DIAGNOSIS — I2510 Atherosclerotic heart disease of native coronary artery without angina pectoris: Secondary | ICD-10-CM | POA: Insufficient documentation

## 2023-07-09 DIAGNOSIS — J189 Pneumonia, unspecified organism: Secondary | ICD-10-CM | POA: Diagnosis not present

## 2023-07-09 DIAGNOSIS — I10 Essential (primary) hypertension: Secondary | ICD-10-CM | POA: Diagnosis not present

## 2023-07-09 DIAGNOSIS — R0902 Hypoxemia: Secondary | ICD-10-CM | POA: Diagnosis not present

## 2023-07-09 DIAGNOSIS — U071 COVID-19: Secondary | ICD-10-CM

## 2023-07-09 DIAGNOSIS — Z66 Do not resuscitate: Secondary | ICD-10-CM | POA: Diagnosis present

## 2023-07-09 DIAGNOSIS — J440 Chronic obstructive pulmonary disease with acute lower respiratory infection: Secondary | ICD-10-CM | POA: Diagnosis present

## 2023-07-09 DIAGNOSIS — J441 Chronic obstructive pulmonary disease with (acute) exacerbation: Secondary | ICD-10-CM | POA: Diagnosis present

## 2023-07-09 DIAGNOSIS — R197 Diarrhea, unspecified: Secondary | ICD-10-CM | POA: Diagnosis not present

## 2023-07-09 DIAGNOSIS — R001 Bradycardia, unspecified: Secondary | ICD-10-CM | POA: Insufficient documentation

## 2023-07-09 DIAGNOSIS — F419 Anxiety disorder, unspecified: Secondary | ICD-10-CM | POA: Diagnosis present

## 2023-07-09 DIAGNOSIS — R059 Cough, unspecified: Secondary | ICD-10-CM | POA: Diagnosis not present

## 2023-07-09 DIAGNOSIS — N189 Chronic kidney disease, unspecified: Secondary | ICD-10-CM | POA: Insufficient documentation

## 2023-07-09 DIAGNOSIS — Z9981 Dependence on supplemental oxygen: Secondary | ICD-10-CM | POA: Diagnosis not present

## 2023-07-09 DIAGNOSIS — I252 Old myocardial infarction: Secondary | ICD-10-CM | POA: Diagnosis not present

## 2023-07-09 DIAGNOSIS — K219 Gastro-esophageal reflux disease without esophagitis: Secondary | ICD-10-CM | POA: Diagnosis present

## 2023-07-09 LAB — BASIC METABOLIC PANEL
Anion gap: 8 (ref 5–15)
BUN: 16 mg/dL (ref 8–23)
CO2: 25 mmol/L (ref 22–32)
Calcium: 9.3 mg/dL (ref 8.9–10.3)
Chloride: 109 mmol/L (ref 98–111)
Creatinine, Ser: 0.92 mg/dL (ref 0.61–1.24)
GFR, Estimated: >60 mL/min (ref >60)
Glucose, Bld: 112 mg/dL — ABNORMAL HIGH (ref 70–99)
Potassium: 5 mmol/L (ref 3.5–5.1)
Sodium: 142 mmol/L (ref 135–145)

## 2023-07-09 LAB — CBC
HCT: 40.1 % (ref 39.0–52.0)
Hemoglobin: 12.3 g/dL — ABNORMAL LOW (ref 13.0–17.0)
MCH: 28.3 pg (ref 26.0–34.0)
MCHC: 30.7 g/dL (ref 30.0–36.0)
MCV: 92.4 fL (ref 80.0–100.0)
Platelets: 163 10*3/uL (ref 150–400)
RBC: 4.34 MIL/uL (ref 4.22–5.81)
RDW: 17 % — ABNORMAL HIGH (ref 11.5–15.5)
WBC: 6.4 10*3/uL (ref 4.0–10.5)
nRBC: 0 % (ref 0.0–0.2)

## 2023-07-09 LAB — RESP PANEL BY RT-PCR (RSV, FLU A&B, COVID)  RVPGX2
Influenza A by PCR: NEGATIVE
Influenza B by PCR: NEGATIVE
Resp Syncytial Virus by PCR: NEGATIVE
SARS Coronavirus 2 by RT PCR: POSITIVE — AB

## 2023-07-09 LAB — TROPONIN I (HIGH SENSITIVITY): Troponin I (High Sensitivity): 24 ng/L — ABNORMAL HIGH (ref <18)

## 2023-07-09 MED ORDER — MOLNUPIRAVIR 200 MG PO CAPS: 4.0000 | ORAL_CAPSULE | Freq: Two times a day (BID) | ORAL | 0 refills | Status: DC

## 2023-07-09 MED ORDER — ACETAMINOPHEN 500 MG PO TABS
1000.0000 mg | ORAL_TABLET | Freq: Once | ORAL | Status: AC
  Administered 2023-07-09: 1000 mg via ORAL
  Filled 2023-07-09: qty 2

## 2023-07-09 MED ORDER — LIDOCAINE 5 % EX PTCH
1.0000 | MEDICATED_PATCH | CUTANEOUS | Status: DC
  Administered 2023-07-09: 1 via TRANSDERMAL
  Filled 2023-07-09: qty 1

## 2023-07-09 NOTE — ED Provider Notes (Signed)
[facility] Provider Note    Event Date/Time   First MD Initiated Contact with Patient 07/09/23 1334     (approximate)   History   Chief Complaint covid symptoms   HPI  Lawrence VANDERSCHAAF is a 67 y.o. male with past medical history of hypertension, diabetes, CAD status post CABG, CHF, CKD, and COPD with chronic hypoxic respiratory failure on 2 L nasal cannula who presents to the ED complaining of COVID symptoms.  Patient reports that for the past 2 days he has been dealing with a runny nose, cough, congestion, and some mild difficulty breathing.  He states that he has some difficulty breathing at baseline but this is slightly worse than usual.  He denies any associated fevers, does state that he has had some sharp pain in the left side of his chest since getting up this morning.  He has not had any nausea, vomiting, or diarrhea.  He has not noticed any pain or swelling in his legs.  He does state that he has been around 2 other people who tested positive for COVID.     Physical Exam   Triage Vital Signs: ED Triage Vitals  Encounter Vitals Group     BP 07/09/23 1249 (!) 162/88     Systolic BP Percentile --      Diastolic BP Percentile --      Pulse Rate 07/09/23 1249 (!) 52     Resp 07/09/23 1249 18     Temp 07/09/23 1249 97.9 F (36.6 C)     Temp Source 07/09/23 1249 Oral     SpO2 07/09/23 1249 100 %     Weight --      Height --      Head Circumference --      Peak Flow --      Pain Score 07/09/23 1250 9     Pain Loc --      Pain Education --      Exclude from Growth Chart --     Most recent vital signs: Vitals:   07/09/23 1249 07/09/23 1339  BP: (!) 162/88 (!) 171/93  Pulse: (!) 52 (!) 51  Resp: 18 16  Temp: 97.9 F (36.6 C) 97.8 F (36.6 C)  SpO2: 100% 100%    Constitutional: Alert and oriented. Eyes: Conjunctivae are normal. Head: Atraumatic. Nose: No congestion/rhinnorhea. Mouth/Throat: Mucous membranes are moist.   Cardiovascular: Normal rate, regular rhythm. Grossly normal heart sounds.  2+ radial pulses bilaterally. Respiratory: Normal respiratory effort.  No retractions. Lungs CTAB. Gastrointestinal: Soft and nontender. No distention. Musculoskeletal: No lower extremity tenderness nor edema.  Neurologic:  Normal speech and language. No gross focal neurologic deficits are appreciated.    ED Results / Procedures / Treatments   Labs (all labs ordered are listed, but only abnormal results are displayed) Labs Reviewed  RESP PANEL BY RT-PCR (RSV, FLU A&B, COVID)  RVPGX2 - Abnormal; Notable for the following components:      Result Value   SARS Coronavirus 2 by RT PCR POSITIVE (*)    All other components within normal limits  BASIC METABOLIC PANEL - Abnormal; Notable for the following components:   Glucose, Bld 112 (*)    All other components within normal limits  CBC - Abnormal; Notable for the following components:   Hemoglobin 12.3 (*)    RDW 17.0 (*)    All other components within normal limits  TROPONIN I (HIGH SENSITIVITY) - Abnormal; Notable for the following components:  Troponin I (High Sensitivity) 24 (*)    All other components within normal limits     EKG  ED ECG REPORT I, [name], the attending physician, personally viewed and interpreted this ECG.   Date: 07/09/2023  EKG Time: 12:56  Rate: 53  Rhythm: sinus bradycardia  Axis: Normal  Intervals:none  ST&T Change: Nonspecific T wave abnormality  RADIOLOGY Chest x-ray reviewed and interpreted by me with no infiltrate, edema, or effusion.  PROCEDURES:  Critical Care performed: No  Procedures   MEDICATIONS ORDERED IN ED: Medications  lidocaine (LIDODERM) 5 % 1 patch (has no administration in time range)  acetaminophen (TYLENOL) tablet 1,000 mg (has no administration in time range)     IMPRESSION / MDM / ASSESSMENT AND PLAN / ED COURSE  I reviewed the triage vital signs and the nursing notes.                               67 y.o. male with past medical history of hypertension, diabetes, CAD status post CABG, CHF, CKD, and COPD with chronic hypoxic respiratory failure on 2 L nasal cannula who presents to the ED complaining of cough, congestion, runny nose, chest pain, and some acute on chronic difficulty breathing.  Patient's presentation is most consistent with acute presentation with potential threat to life or bodily function.  Differential diagnosis includes, but is not limited to, ACS, PE, pneumonia, pneumothorax, COVID-19, other viral syndrome, dehydration, electrolyte abnormality, AKI, anemia.  Patient nontoxic-appearing and in no acute distress, vital signs remarkable for bradycardia but otherwise reassuring.  EKG shows sinus bradycardia with no ischemic changes, troponin mildly elevated but similar to prior baseline and I have low suspicion for ACS.  Remainder of labs are reassuring with no significant anemia, leukocytosis, electrolyte abnormality, or AKI.  Chest x-ray and COVID testing pending at this time.  COVID testing is positive, chest x-ray shows no evidence of pneumonia, does show pulmonary vascular congestion but no overt pulmonary edema.  Patient breathing comfortably on his typical 2 L nasal cannula on reassessment.  He is appropriate for discharge home, unfortunately is not a candidate for Paxlovid as this interacts with his amiodarone and Eliquis.  We will start him on molnupiravir and he was counseled to follow-up with his PCP, otherwise return to the ED for new or worsening symptoms.  Patient agrees with plan.      FINAL CLINICAL IMPRESSION(S) / ED DIAGNOSES   Final diagnoses:  COVID-19  Atypical chest pain     Rx / DC Orders   ED Discharge Orders          Ordered    molnupiravir EUA (LAGEVRIO) 200 MG CAPS capsule  2 times daily        07/09/23 1408             Note:  This document was prepared using Dragon voice recognition software and may include  unintentional dictation errors.   [name], MD 07/09/23 1409

## 2023-07-09 NOTE — ED Triage Notes (Signed)
PT here via [facility] with covid like symptoms. Pt having diarrhea and weakness. Pt has been around 2 people that are covid positive. Hx of a fib, MI with 4 stents, gall bladder removal, and DM.   54 99% RA 2L at home 97.6 108-cbg 168/87

## 2023-07-09 NOTE — ED Triage Notes (Signed)
Pt reports his neighbors had covid, he has had N/V/D and temp 99.8 at home since yesterday. Pt took tylenol today. Pt has COPD, chronic on 2 liters. Pt reports mild CP and chronic back pain.

## 2023-07-10 ENCOUNTER — Other Ambulatory Visit: Payer: Self-pay | Admitting: Family Medicine

## 2023-07-11 ENCOUNTER — Encounter (INDEPENDENT_AMBULATORY_CARE_PROVIDER_SITE_OTHER): Payer: Self-pay | Admitting: Vascular Surgery

## 2023-07-11 ENCOUNTER — Encounter: Payer: Self-pay | Admitting: *Deleted

## 2023-07-11 ENCOUNTER — Emergency Department: Payer: Medicare HMO

## 2023-07-11 ENCOUNTER — Other Ambulatory Visit: Payer: Self-pay

## 2023-07-11 ENCOUNTER — Inpatient Hospital Stay
Admission: EM | Admit: 2023-07-11 | Discharge: 2023-07-15 | DRG: 177 | Disposition: A | Discharge status: Home / Self Care | Payer: Medicare HMO | Attending: Internal Medicine | Admitting: Internal Medicine

## 2023-07-11 DIAGNOSIS — J449 Chronic obstructive pulmonary disease, unspecified: Secondary | ICD-10-CM | POA: Diagnosis present

## 2023-07-11 DIAGNOSIS — I2489 Other forms of acute ischemic heart disease: Secondary | ICD-10-CM | POA: Diagnosis present

## 2023-07-11 DIAGNOSIS — Z825 Family history of asthma and other chronic lower respiratory diseases: Secondary | ICD-10-CM

## 2023-07-11 DIAGNOSIS — Z955 Presence of coronary angioplasty implant and graft: Secondary | ICD-10-CM

## 2023-07-11 DIAGNOSIS — Z79899 Other long term (current) drug therapy: Secondary | ICD-10-CM

## 2023-07-11 DIAGNOSIS — J1282 Pneumonia due to coronavirus disease 2019: Secondary | ICD-10-CM | POA: Diagnosis present

## 2023-07-11 DIAGNOSIS — Z882 Allergy status to sulfonamides status: Secondary | ICD-10-CM

## 2023-07-11 DIAGNOSIS — N182 Chronic kidney disease, stage 2 (mild): Secondary | ICD-10-CM | POA: Diagnosis present

## 2023-07-11 DIAGNOSIS — J189 Pneumonia, unspecified organism: Secondary | ICD-10-CM

## 2023-07-11 DIAGNOSIS — Z820 Family history of epilepsy and other diseases of the nervous system: Secondary | ICD-10-CM

## 2023-07-11 DIAGNOSIS — Z8249 Family history of ischemic heart disease and other diseases of the circulatory system: Secondary | ICD-10-CM

## 2023-07-11 DIAGNOSIS — R059 Cough, unspecified: Secondary | ICD-10-CM | POA: Diagnosis not present

## 2023-07-11 DIAGNOSIS — Z87891 Personal history of nicotine dependence: Secondary | ICD-10-CM

## 2023-07-11 DIAGNOSIS — E119 Type 2 diabetes mellitus without complications: Secondary | ICD-10-CM

## 2023-07-11 DIAGNOSIS — I10 Essential (primary) hypertension: Secondary | ICD-10-CM | POA: Diagnosis not present

## 2023-07-11 DIAGNOSIS — J440 Chronic obstructive pulmonary disease with acute lower respiratory infection: Secondary | ICD-10-CM | POA: Diagnosis present

## 2023-07-11 DIAGNOSIS — Z951 Presence of aortocoronary bypass graft: Secondary | ICD-10-CM

## 2023-07-11 DIAGNOSIS — I252 Old myocardial infarction: Secondary | ICD-10-CM

## 2023-07-11 DIAGNOSIS — R0789 Other chest pain: Secondary | ICD-10-CM | POA: Diagnosis not present

## 2023-07-11 DIAGNOSIS — I2510 Atherosclerotic heart disease of native coronary artery without angina pectoris: Secondary | ICD-10-CM | POA: Diagnosis present

## 2023-07-11 DIAGNOSIS — I5032 Chronic diastolic (congestive) heart failure: Secondary | ICD-10-CM | POA: Diagnosis present

## 2023-07-11 DIAGNOSIS — K219 Gastro-esophageal reflux disease without esophagitis: Secondary | ICD-10-CM | POA: Diagnosis present

## 2023-07-11 DIAGNOSIS — I130 Hypertensive heart and chronic kidney disease with heart failure and stage 1 through stage 4 chronic kidney disease, or unspecified chronic kidney disease: Secondary | ICD-10-CM | POA: Diagnosis present

## 2023-07-11 DIAGNOSIS — Z888 Allergy status to other drugs, medicaments and biological substances status: Secondary | ICD-10-CM

## 2023-07-11 DIAGNOSIS — Z9981 Dependence on supplemental oxygen: Secondary | ICD-10-CM

## 2023-07-11 DIAGNOSIS — R079 Chest pain, unspecified: Secondary | ICD-10-CM | POA: Diagnosis present

## 2023-07-11 DIAGNOSIS — E1165 Type 2 diabetes mellitus with hyperglycemia: Secondary | ICD-10-CM | POA: Diagnosis present

## 2023-07-11 DIAGNOSIS — E1122 Type 2 diabetes mellitus with diabetic chronic kidney disease: Secondary | ICD-10-CM | POA: Diagnosis present

## 2023-07-11 DIAGNOSIS — Z8489 Family history of other specified conditions: Secondary | ICD-10-CM

## 2023-07-11 DIAGNOSIS — J441 Chronic obstructive pulmonary disease with (acute) exacerbation: Secondary | ICD-10-CM | POA: Diagnosis present

## 2023-07-11 DIAGNOSIS — R918 Other nonspecific abnormal finding of lung field: Secondary | ICD-10-CM | POA: Diagnosis not present

## 2023-07-11 DIAGNOSIS — Z7901 Long term (current) use of anticoagulants: Secondary | ICD-10-CM

## 2023-07-11 DIAGNOSIS — Z555 Less than a high school diploma: Secondary | ICD-10-CM

## 2023-07-11 DIAGNOSIS — Z7984 Long term (current) use of oral hypoglycemic drugs: Secondary | ICD-10-CM

## 2023-07-11 DIAGNOSIS — E785 Hyperlipidemia, unspecified: Secondary | ICD-10-CM | POA: Diagnosis present

## 2023-07-11 DIAGNOSIS — U071 COVID-19: Principal | ICD-10-CM | POA: Diagnosis present

## 2023-07-11 DIAGNOSIS — Z66 Do not resuscitate: Secondary | ICD-10-CM | POA: Diagnosis present

## 2023-07-11 DIAGNOSIS — Z7902 Long term (current) use of antithrombotics/antiplatelets: Secondary | ICD-10-CM

## 2023-07-11 DIAGNOSIS — J984 Other disorders of lung: Secondary | ICD-10-CM | POA: Diagnosis not present

## 2023-07-11 DIAGNOSIS — M109 Gout, unspecified: Secondary | ICD-10-CM | POA: Diagnosis present

## 2023-07-11 DIAGNOSIS — I480 Paroxysmal atrial fibrillation: Secondary | ICD-10-CM | POA: Diagnosis present

## 2023-07-11 DIAGNOSIS — Z7951 Long term (current) use of inhaled steroids: Secondary | ICD-10-CM

## 2023-07-11 DIAGNOSIS — Z833 Family history of diabetes mellitus: Secondary | ICD-10-CM

## 2023-07-11 DIAGNOSIS — F419 Anxiety disorder, unspecified: Secondary | ICD-10-CM | POA: Diagnosis present

## 2023-07-11 DIAGNOSIS — Z885 Allergy status to narcotic agent status: Secondary | ICD-10-CM

## 2023-07-11 DIAGNOSIS — Z85828 Personal history of other malignant neoplasm of skin: Secondary | ICD-10-CM

## 2023-07-11 DIAGNOSIS — N179 Acute kidney failure, unspecified: Secondary | ICD-10-CM | POA: Diagnosis not present

## 2023-07-11 DIAGNOSIS — I739 Peripheral vascular disease, unspecified: Secondary | ICD-10-CM | POA: Insufficient documentation

## 2023-07-11 DIAGNOSIS — J9621 Acute and chronic respiratory failure with hypoxia: Secondary | ICD-10-CM | POA: Diagnosis present

## 2023-07-11 DIAGNOSIS — Z818 Family history of other mental and behavioral disorders: Secondary | ICD-10-CM

## 2023-07-11 LAB — BASIC METABOLIC PANEL
Anion gap: 6 (ref 5–15)
BUN: 9 mg/dL (ref 8–23)
CO2: 26 mmol/L (ref 22–32)
Calcium: 9.1 mg/dL (ref 8.9–10.3)
Chloride: 106 mmol/L (ref 98–111)
Creatinine, Ser: 0.76 mg/dL (ref 0.61–1.24)
GFR, Estimated: >60 mL/min (ref >60)
Glucose, Bld: 118 mg/dL — ABNORMAL HIGH (ref 70–99)
Potassium: 4.1 mmol/L (ref 3.5–5.1)
Sodium: 138 mmol/L (ref 135–145)

## 2023-07-11 LAB — TROPONIN I (HIGH SENSITIVITY): Troponin I (High Sensitivity): 36 ng/L — ABNORMAL HIGH (ref <18)

## 2023-07-11 LAB — CBC
HCT: 40.1 % (ref 39.0–52.0)
Hemoglobin: 12.6 g/dL — ABNORMAL LOW (ref 13.0–17.0)
MCH: 28.6 pg (ref 26.0–34.0)
MCHC: 31.4 g/dL (ref 30.0–36.0)
MCV: 91.1 fL (ref 80.0–100.0)
Platelets: 159 10*3/uL (ref 150–400)
RBC: 4.4 MIL/uL (ref 4.22–5.81)
RDW: 16.6 % — ABNORMAL HIGH (ref 11.5–15.5)
WBC: 6.7 10*3/uL (ref 4.0–10.5)
nRBC: 0 % (ref 0.0–0.2)

## 2023-07-11 NOTE — ED Triage Notes (Signed)
Pt reports central/left sided cp "sharp, feels like someone is sitting on my chest", increased SOB (baseline COPD, on 2 liters), productive cough. COVID +

## 2023-07-11 NOTE — ED Triage Notes (Signed)
Pt from home via [facility], per their report pt called out with CP that started two hours ago, left sided, 9/10, described as pressure. COVID + on Friday. EKG NSR. Given 324 ASA, 1 nitroglycerin paste on the left chest. IV established in the right forearm (#18) , 188/95, cbg 104, 99.7, 99% 3 liters (baseline 2 liters), HR 60

## 2023-07-12 ENCOUNTER — Telehealth: Payer: Self-pay | Admitting: Family Medicine

## 2023-07-12 DIAGNOSIS — I5032 Chronic diastolic (congestive) heart failure: Secondary | ICD-10-CM | POA: Diagnosis present

## 2023-07-12 DIAGNOSIS — Z7984 Long term (current) use of oral hypoglycemic drugs: Secondary | ICD-10-CM | POA: Diagnosis not present

## 2023-07-12 DIAGNOSIS — I10 Essential (primary) hypertension: Secondary | ICD-10-CM | POA: Diagnosis not present

## 2023-07-12 DIAGNOSIS — I2489 Other forms of acute ischemic heart disease: Secondary | ICD-10-CM | POA: Diagnosis present

## 2023-07-12 DIAGNOSIS — J440 Chronic obstructive pulmonary disease with acute lower respiratory infection: Secondary | ICD-10-CM | POA: Diagnosis present

## 2023-07-12 DIAGNOSIS — E1122 Type 2 diabetes mellitus with diabetic chronic kidney disease: Secondary | ICD-10-CM | POA: Diagnosis present

## 2023-07-12 DIAGNOSIS — E1165 Type 2 diabetes mellitus with hyperglycemia: Secondary | ICD-10-CM | POA: Diagnosis present

## 2023-07-12 DIAGNOSIS — J189 Pneumonia, unspecified organism: Secondary | ICD-10-CM | POA: Diagnosis not present

## 2023-07-12 DIAGNOSIS — E785 Hyperlipidemia, unspecified: Secondary | ICD-10-CM | POA: Diagnosis present

## 2023-07-12 DIAGNOSIS — Z79899 Other long term (current) drug therapy: Secondary | ICD-10-CM | POA: Diagnosis not present

## 2023-07-12 DIAGNOSIS — K219 Gastro-esophageal reflux disease without esophagitis: Secondary | ICD-10-CM | POA: Diagnosis present

## 2023-07-12 DIAGNOSIS — J449 Chronic obstructive pulmonary disease, unspecified: Secondary | ICD-10-CM | POA: Diagnosis not present

## 2023-07-12 DIAGNOSIS — N179 Acute kidney failure, unspecified: Secondary | ICD-10-CM | POA: Diagnosis present

## 2023-07-12 DIAGNOSIS — U071 COVID-19: Secondary | ICD-10-CM | POA: Diagnosis present

## 2023-07-12 DIAGNOSIS — I480 Paroxysmal atrial fibrillation: Secondary | ICD-10-CM | POA: Diagnosis present

## 2023-07-12 DIAGNOSIS — N182 Chronic kidney disease, stage 2 (mild): Secondary | ICD-10-CM | POA: Diagnosis present

## 2023-07-12 DIAGNOSIS — J984 Other disorders of lung: Secondary | ICD-10-CM | POA: Diagnosis not present

## 2023-07-12 DIAGNOSIS — Z66 Do not resuscitate: Secondary | ICD-10-CM | POA: Diagnosis present

## 2023-07-12 DIAGNOSIS — E119 Type 2 diabetes mellitus without complications: Secondary | ICD-10-CM | POA: Diagnosis not present

## 2023-07-12 DIAGNOSIS — R0902 Hypoxemia: Secondary | ICD-10-CM | POA: Diagnosis not present

## 2023-07-12 DIAGNOSIS — J9621 Acute and chronic respiratory failure with hypoxia: Secondary | ICD-10-CM | POA: Diagnosis present

## 2023-07-12 DIAGNOSIS — J1282 Pneumonia due to coronavirus disease 2019: Secondary | ICD-10-CM | POA: Diagnosis present

## 2023-07-12 DIAGNOSIS — I2510 Atherosclerotic heart disease of native coronary artery without angina pectoris: Secondary | ICD-10-CM | POA: Diagnosis present

## 2023-07-12 DIAGNOSIS — F419 Anxiety disorder, unspecified: Secondary | ICD-10-CM | POA: Diagnosis present

## 2023-07-12 DIAGNOSIS — R079 Chest pain, unspecified: Secondary | ICD-10-CM | POA: Diagnosis present

## 2023-07-12 DIAGNOSIS — M109 Gout, unspecified: Secondary | ICD-10-CM | POA: Diagnosis present

## 2023-07-12 DIAGNOSIS — Z9981 Dependence on supplemental oxygen: Secondary | ICD-10-CM | POA: Diagnosis not present

## 2023-07-12 DIAGNOSIS — I130 Hypertensive heart and chronic kidney disease with heart failure and stage 1 through stage 4 chronic kidney disease, or unspecified chronic kidney disease: Secondary | ICD-10-CM | POA: Diagnosis present

## 2023-07-12 DIAGNOSIS — I252 Old myocardial infarction: Secondary | ICD-10-CM | POA: Diagnosis not present

## 2023-07-12 DIAGNOSIS — J441 Chronic obstructive pulmonary disease with (acute) exacerbation: Secondary | ICD-10-CM | POA: Diagnosis present

## 2023-07-12 DIAGNOSIS — Z7901 Long term (current) use of anticoagulants: Secondary | ICD-10-CM | POA: Diagnosis not present

## 2023-07-12 LAB — CBG MONITORING, ED
Glucose-Capillary: 99 mg/dL (ref 70–99)
Glucose-Capillary: 104 mg/dL — ABNORMAL HIGH (ref 70–99)
Glucose-Capillary: 112 mg/dL — ABNORMAL HIGH (ref 70–99)

## 2023-07-12 LAB — TROPONIN I (HIGH SENSITIVITY)
Troponin I (High Sensitivity): 27 ng/L — ABNORMAL HIGH (ref <18)
Troponin I (High Sensitivity): 30 ng/L — ABNORMAL HIGH (ref <18)

## 2023-07-12 LAB — BASIC METABOLIC PANEL
Anion gap: 6 (ref 5–15)
BUN: 10 mg/dL (ref 8–23)
CO2: 26 mmol/L (ref 22–32)
Calcium: 8.6 mg/dL — ABNORMAL LOW (ref 8.9–10.3)
Chloride: 107 mmol/L (ref 98–111)
Creatinine, Ser: 0.7 mg/dL (ref 0.61–1.24)
GFR, Estimated: >60 mL/min (ref >60)
Glucose, Bld: 109 mg/dL — ABNORMAL HIGH (ref 70–99)
Potassium: 4.2 mmol/L (ref 3.5–5.1)
Sodium: 139 mmol/L (ref 135–145)

## 2023-07-12 LAB — CBC
HCT: 38 % — ABNORMAL LOW (ref 39.0–52.0)
Hemoglobin: 11.9 g/dL — ABNORMAL LOW (ref 13.0–17.0)
MCH: 28.8 pg (ref 26.0–34.0)
MCHC: 31.3 g/dL (ref 30.0–36.0)
MCV: 92 fL (ref 80.0–100.0)
Platelets: 142 10*3/uL — ABNORMAL LOW (ref 150–400)
RBC: 4.13 MIL/uL — ABNORMAL LOW (ref 4.22–5.81)
RDW: 16.6 % — ABNORMAL HIGH (ref 11.5–15.5)
WBC: 5.6 10*3/uL (ref 4.0–10.5)
nRBC: 0 % (ref 0.0–0.2)

## 2023-07-12 LAB — HIV ANTIBODY (ROUTINE TESTING W REFLEX): HIV Screen 4th Generation wRfx: NONREACTIVE

## 2023-07-12 LAB — GLUCOSE, CAPILLARY
Glucose-Capillary: 126 mg/dL — ABNORMAL HIGH (ref 70–99)
Glucose-Capillary: 133 mg/dL — ABNORMAL HIGH (ref 70–99)

## 2023-07-12 LAB — LACTIC ACID, PLASMA
Lactic Acid, Venous: 1 mmol/L (ref 0.5–1.9)
Lactic Acid, Venous: 0.9 mmol/L (ref 0.5–1.9)

## 2023-07-12 LAB — PROCALCITONIN: Procalcitonin: <0.1 ng/mL

## 2023-07-12 LAB — D-DIMER, QUANTITATIVE: D-Dimer, Quant: 1.22 ug{FEU}/mL — ABNORMAL HIGH (ref 0.00–0.50)

## 2023-07-12 MED ORDER — HYDROMORPHONE HCL 1 MG/ML IJ SOLN
1.0000 mg | INTRAMUSCULAR | Status: DC | PRN
  Administered 2023-07-12: 1 mg via INTRAVENOUS
  Filled 2023-07-12: qty 1

## 2023-07-12 MED ORDER — KETOROLAC TROMETHAMINE 15 MG/ML IJ SOLN
15.0000 mg | Freq: Four times a day (QID) | INTRAMUSCULAR | Status: DC | PRN
  Administered 2023-07-12 – 2023-07-14 (×8): 15 mg via INTRAVENOUS
  Filled 2023-07-12 (×8): qty 1

## 2023-07-12 MED ORDER — DICYCLOMINE HCL 10 MG PO CAPS: 10.0000 mg | ORAL_CAPSULE | Freq: Three times a day (TID) | ORAL | Status: DC | PRN

## 2023-07-12 MED ORDER — POLYSACCHARIDE IRON COMPLEX 150 MG PO CAPS
150.0000 mg | ORAL_CAPSULE | Freq: Every day | ORAL | Status: DC
  Administered 2023-07-12 – 2023-07-15 (×4): 150 mg via ORAL
  Filled 2023-07-12 (×4): qty 1

## 2023-07-12 MED ORDER — VENLAFAXINE HCL ER 75 MG PO CP24
75.0000 mg | ORAL_CAPSULE | Freq: Every day | ORAL | Status: DC
  Administered 2023-07-12 – 2023-07-15 (×4): 75 mg via ORAL
  Filled 2023-07-12 (×4): qty 1

## 2023-07-12 MED ORDER — ACETAMINOPHEN 325 MG PO TABS
650.0000 mg | ORAL_TABLET | Freq: Once | ORAL | Status: AC
  Administered 2023-07-12: 650 mg via ORAL
  Filled 2023-07-12: qty 2

## 2023-07-12 MED ORDER — INSULIN ASPART 100 UNIT/ML IJ SOLN
0.0000 [IU] | Freq: Three times a day (TID) | INTRAMUSCULAR | Status: DC
  Administered 2023-07-13: 1 [IU] via SUBCUTANEOUS
  Administered 2023-07-14 (×2): 2 [IU] via SUBCUTANEOUS
  Administered 2023-07-14: 1 [IU] via SUBCUTANEOUS
  Administered 2023-07-15 (×2): 2 [IU] via SUBCUTANEOUS
  Filled 2023-07-12 (×6): qty 1

## 2023-07-12 MED ORDER — TRAZODONE HCL 50 MG PO TABS: 25.0000 mg | ORAL_TABLET | Freq: Every evening | ORAL | Status: DC | PRN

## 2023-07-12 MED ORDER — HYDROCOD POLI-CHLORPHE POLI ER 10-8 MG/5ML PO SUER: 5.0000 mL | Freq: Two times a day (BID) | ORAL | Status: DC | PRN

## 2023-07-12 MED ORDER — ALLOPURINOL 100 MG PO TABS
300.0000 mg | ORAL_TABLET | Freq: Two times a day (BID) | ORAL | Status: DC
  Administered 2023-07-12 – 2023-07-15 (×7): 300 mg via ORAL
  Filled 2023-07-12 (×3): qty 3
  Filled 2023-07-12: qty 1
  Filled 2023-07-12: qty 3
  Filled 2023-07-12: qty 1
  Filled 2023-07-12 (×2): qty 3

## 2023-07-12 MED ORDER — IPRATROPIUM-ALBUTEROL 20-100 MCG/ACT IN AERS
1.0000 | INHALATION_SPRAY | Freq: Four times a day (QID) | RESPIRATORY_TRACT | Status: DC
  Administered 2023-07-12 – 2023-07-15 (×11): 1 via RESPIRATORY_TRACT
  Filled 2023-07-12: qty 4

## 2023-07-12 MED ORDER — METHOCARBAMOL 500 MG PO TABS
500.0000 mg | ORAL_TABLET | Freq: Three times a day (TID) | ORAL | Status: DC | PRN
  Administered 2023-07-12 – 2023-07-14 (×3): 500 mg via ORAL
  Filled 2023-07-12 (×3): qty 1

## 2023-07-12 MED ORDER — ACETAMINOPHEN 325 MG PO TABS
650.0000 mg | ORAL_TABLET | ORAL | Status: DC | PRN
  Administered 2023-07-13 – 2023-07-14 (×6): 650 mg via ORAL
  Filled 2023-07-12 (×6): qty 2

## 2023-07-12 MED ORDER — OXYCODONE HCL 5 MG PO TABS: 5.0000 mg | ORAL_TABLET | Freq: Four times a day (QID) | ORAL | Status: DC | PRN

## 2023-07-12 MED ORDER — MECLIZINE HCL 25 MG PO TABS: 12.5000 mg | ORAL_TABLET | Freq: Three times a day (TID) | ORAL | Status: DC | PRN

## 2023-07-12 MED ORDER — INSULIN ASPART 100 UNIT/ML IJ SOLN
0.0000 [IU] | Freq: Every day | INTRAMUSCULAR | Status: DC
  Administered 2023-07-13: 2 [IU] via SUBCUTANEOUS
  Administered 2023-07-14: 3 [IU] via SUBCUTANEOUS
  Filled 2023-07-12 (×2): qty 1

## 2023-07-12 MED ORDER — OXYCODONE HCL 5 MG PO TABS: 5.0000 mg | ORAL_TABLET | ORAL | Status: DC | PRN

## 2023-07-12 MED ORDER — LORATADINE 10 MG PO TABS
10.0000 mg | ORAL_TABLET | Freq: Every day | ORAL | Status: DC
  Administered 2023-07-12 – 2023-07-15 (×4): 10 mg via ORAL
  Filled 2023-07-12 (×4): qty 1

## 2023-07-12 MED ORDER — METOPROLOL SUCCINATE ER 25 MG PO TB24
12.5000 mg | ORAL_TABLET | Freq: Every day | ORAL | Status: DC
  Administered 2023-07-14 – 2023-07-15 (×2): 12.5 mg via ORAL
  Filled 2023-07-12 (×4): qty 1

## 2023-07-12 MED ORDER — TRAZODONE HCL 50 MG PO TABS
150.0000 mg | ORAL_TABLET | Freq: Every day | ORAL | Status: DC
  Administered 2023-07-12 – 2023-07-14 (×3): 150 mg via ORAL
  Filled 2023-07-12 (×3): qty 1

## 2023-07-12 MED ORDER — CLOPIDOGREL BISULFATE 75 MG PO TABS
75.0000 mg | ORAL_TABLET | Freq: Every day | ORAL | Status: DC
  Administered 2023-07-12 – 2023-07-15 (×4): 75 mg via ORAL
  Filled 2023-07-12 (×4): qty 1

## 2023-07-12 MED ORDER — ONDANSETRON HCL 4 MG PO TABS: 4.0000 mg | ORAL_TABLET | Freq: Four times a day (QID) | ORAL | Status: DC | PRN

## 2023-07-12 MED ORDER — OXYCODONE-ACETAMINOPHEN 10-325 MG PO TABS: 1.0000 | ORAL_TABLET | ORAL | Status: DC | PRN

## 2023-07-12 MED ORDER — MOLNUPIRAVIR 200 MG PO CAPS
4.0000 | ORAL_CAPSULE | Freq: Two times a day (BID) | ORAL | Status: DC
  Administered 2023-07-13: 800 mg via ORAL
  Filled 2023-07-12 (×3): qty 4

## 2023-07-12 MED ORDER — FAMOTIDINE 20 MG PO TABS
20.0000 mg | ORAL_TABLET | Freq: Two times a day (BID) | ORAL | Status: DC
  Administered 2023-07-12 – 2023-07-15 (×7): 20 mg via ORAL
  Filled 2023-07-12 (×9): qty 1

## 2023-07-12 MED ORDER — MAGNESIUM HYDROXIDE 400 MG/5ML PO SUSP: 30.0000 mL | Freq: Every day | ORAL | Status: DC | PRN

## 2023-07-12 MED ORDER — ALUM & MAG HYDROXIDE-SIMETH 200-200-20 MG/5ML PO SUSP: 30.0000 mL | ORAL | Status: DC | PRN

## 2023-07-12 MED ORDER — ATORVASTATIN CALCIUM 80 MG PO TABS
80.0000 mg | ORAL_TABLET | Freq: Every day | ORAL | Status: DC
  Administered 2023-07-12 – 2023-07-15 (×4): 80 mg via ORAL
  Filled 2023-07-12: qty 4
  Filled 2023-07-12 (×3): qty 1

## 2023-07-12 MED ORDER — ALPRAZOLAM 0.5 MG PO TABS
1.0000 mg | ORAL_TABLET | Freq: Three times a day (TID) | ORAL | Status: DC | PRN
  Administered 2023-07-14 – 2023-07-15 (×3): 1 mg via ORAL
  Filled 2023-07-12 (×3): qty 2

## 2023-07-12 MED ORDER — IPRATROPIUM-ALBUTEROL 0.5-2.5 (3) MG/3ML IN SOLN
3.0000 mL | Freq: Four times a day (QID) | RESPIRATORY_TRACT | Status: DC
  Administered 2023-07-12 (×2): 3 mL via RESPIRATORY_TRACT
  Filled 2023-07-12 (×2): qty 3

## 2023-07-12 MED ORDER — ONDANSETRON HCL 4 MG/2ML IJ SOLN: 4.0000 mg | Freq: Four times a day (QID) | INTRAMUSCULAR | Status: DC | PRN

## 2023-07-12 MED ORDER — VITAMIN C 500 MG PO TABS
1000.0000 mg | ORAL_TABLET | Freq: Every day | ORAL | Status: DC
  Administered 2023-07-12 – 2023-07-15 (×4): 1000 mg via ORAL
  Filled 2023-07-12 (×4): qty 2

## 2023-07-12 MED ORDER — LISINOPRIL 20 MG PO TABS
20.0000 mg | ORAL_TABLET | Freq: Every day | ORAL | Status: DC
  Administered 2023-07-12 – 2023-07-14 (×3): 20 mg via ORAL
  Filled 2023-07-12: qty 2
  Filled 2023-07-12 (×2): qty 1

## 2023-07-12 MED ORDER — ASPIRIN 81 MG PO CHEW
324.0000 mg | CHEWABLE_TABLET | Freq: Once | ORAL | Status: AC
  Administered 2023-07-12: 324 mg via ORAL
  Filled 2023-07-12: qty 4

## 2023-07-12 MED ORDER — SODIUM CHLORIDE 0.9 % IV SOLN
500.0000 mg | INTRAVENOUS | Status: DC
  Administered 2023-07-12: 500 mg via INTRAVENOUS
  Filled 2023-07-12: qty 5

## 2023-07-12 MED ORDER — ONDANSETRON HCL 4 MG PO TABS: 4.0000 mg | ORAL_TABLET | Freq: Three times a day (TID) | ORAL | Status: DC | PRN

## 2023-07-12 MED ORDER — CELECOXIB 200 MG PO CAPS: 200.0000 mg | ORAL_CAPSULE | Freq: Two times a day (BID) | ORAL | Status: DC | PRN

## 2023-07-12 MED ORDER — NITROGLYCERIN 0.4 MG SL SUBL: 0.4000 mg | SUBLINGUAL_TABLET | SUBLINGUAL | Status: DC | PRN

## 2023-07-12 MED ORDER — OXYCODONE HCL 5 MG PO TABS
5.0000 mg | ORAL_TABLET | ORAL | Status: DC | PRN
  Administered 2023-07-12 (×2): 5 mg via ORAL
  Administered 2023-07-13 – 2023-07-15 (×9): 10 mg via ORAL
  Filled 2023-07-12 (×2): qty 2
  Filled 2023-07-12: qty 1
  Filled 2023-07-12 (×6): qty 2
  Filled 2023-07-12: qty 1
  Filled 2023-07-12: qty 2

## 2023-07-12 MED ORDER — SODIUM CHLORIDE 0.9 % IV SOLN: INTRAVENOUS | Status: DC

## 2023-07-12 MED ORDER — OXYCODONE-ACETAMINOPHEN 5-325 MG PO TABS: 1.0000 | ORAL_TABLET | ORAL | Status: DC | PRN

## 2023-07-12 MED ORDER — DAPAGLIFLOZIN PROPANEDIOL 10 MG PO TABS
10.0000 mg | ORAL_TABLET | Freq: Every day | ORAL | Status: DC
  Administered 2023-07-12 – 2023-07-14 (×3): 10 mg via ORAL
  Filled 2023-07-12 (×3): qty 1

## 2023-07-12 MED ORDER — RANOLAZINE ER 500 MG PO TB12
1000.0000 mg | ORAL_TABLET | Freq: Two times a day (BID) | ORAL | Status: DC
  Administered 2023-07-12 – 2023-07-15 (×7): 1000 mg via ORAL
  Filled 2023-07-12 (×8): qty 2

## 2023-07-12 MED ORDER — TORSEMIDE 20 MG PO TABS
50.0000 mg | ORAL_TABLET | Freq: Every day | ORAL | Status: DC
  Administered 2023-07-12 – 2023-07-13 (×2): 50 mg via ORAL
  Filled 2023-07-12 (×2): qty 3

## 2023-07-12 MED ORDER — ZINC SULFATE 220 (50 ZN) MG PO CAPS
220.0000 mg | ORAL_CAPSULE | Freq: Every day | ORAL | Status: DC
  Administered 2023-07-12 – 2023-07-15 (×4): 220 mg via ORAL
  Filled 2023-07-12 (×4): qty 1

## 2023-07-12 MED ORDER — PANTOPRAZOLE SODIUM 40 MG PO TBEC
40.0000 mg | DELAYED_RELEASE_TABLET | Freq: Two times a day (BID) | ORAL | Status: DC
  Administered 2023-07-12: 40 mg via ORAL
  Filled 2023-07-12 (×2): qty 1

## 2023-07-12 MED ORDER — APIXABAN 5 MG PO TABS
5.0000 mg | ORAL_TABLET | Freq: Two times a day (BID) | ORAL | Status: DC
  Administered 2023-07-12 – 2023-07-15 (×7): 5 mg via ORAL
  Filled 2023-07-12 (×7): qty 1

## 2023-07-12 MED ORDER — OMEGA-3-ACID ETHYL ESTERS 1 G PO CAPS
1.0000 g | ORAL_CAPSULE | Freq: Every day | ORAL | Status: DC
  Administered 2023-07-12 – 2023-07-15 (×4): 1 g via ORAL
  Filled 2023-07-12 (×5): qty 1

## 2023-07-12 MED ORDER — AMIODARONE HCL 200 MG PO TABS
200.0000 mg | ORAL_TABLET | Freq: Every day | ORAL | Status: DC
  Administered 2023-07-12 – 2023-07-15 (×4): 200 mg via ORAL
  Filled 2023-07-12 (×4): qty 1

## 2023-07-12 MED ORDER — OXYCODONE HCL 5 MG PO TABS
5.0000 mg | ORAL_TABLET | Freq: Once | ORAL | Status: AC
  Administered 2023-07-12: 5 mg via ORAL
  Filled 2023-07-12: qty 1

## 2023-07-12 MED ORDER — ISOSORBIDE MONONITRATE ER 60 MG PO TB24
120.0000 mg | ORAL_TABLET | Freq: Every day | ORAL | Status: DC
  Administered 2023-07-12 – 2023-07-15 (×4): 120 mg via ORAL
  Filled 2023-07-12 (×4): qty 2

## 2023-07-12 MED ORDER — GLIPIZIDE 5 MG PO TABS: 5.0000 mg | ORAL_TABLET | Freq: Every day | ORAL | Status: DC | PRN

## 2023-07-12 MED ORDER — SODIUM CHLORIDE 0.9 % IV SOLN
2.0000 g | INTRAVENOUS | Status: DC
  Administered 2023-07-12: 2 g via INTRAVENOUS
  Filled 2023-07-12: qty 20

## 2023-07-12 MED ORDER — ACETAMINOPHEN 325 MG PO TABS: 650.0000 mg | ORAL_TABLET | Freq: Four times a day (QID) | ORAL | Status: DC | PRN

## 2023-07-12 MED ORDER — ACETAMINOPHEN 325 MG RE SUPP: 650.0000 mg | Freq: Four times a day (QID) | RECTAL | Status: DC | PRN

## 2023-07-12 MED ORDER — GUAIFENESIN ER 600 MG PO TB12
600.0000 mg | ORAL_TABLET | Freq: Two times a day (BID) | ORAL | Status: DC
  Administered 2023-07-12 – 2023-07-15 (×8): 600 mg via ORAL
  Filled 2023-07-12 (×9): qty 1

## 2023-07-12 MED ORDER — PANTOPRAZOLE SODIUM 40 MG PO TBEC
40.0000 mg | DELAYED_RELEASE_TABLET | Freq: Two times a day (BID) | ORAL | Status: DC
  Administered 2023-07-12 – 2023-07-15 (×6): 40 mg via ORAL
  Filled 2023-07-12 (×6): qty 1

## 2023-07-12 NOTE — Assessment & Plan Note (Addendum)
-   The patient will be placed on supplement coverage with NovoLog. -Will continue [place]. - We will hold off metformin.

## 2023-07-12 NOTE — Assessment & Plan Note (Signed)
-   The patient will be admitted to an observation cardiac telemetry bed. - Will follow serial troponins and EKGs. - The patient will be placed on aspirin as well as p.r.n. sublingual nitroglycerin and morphine sulfate for pain. - We will obtain a cardiology consult in a.m. for further cardiac risk stratification. - I notified Dr. Melton Alar about the patient.

## 2023-07-12 NOTE — Assessment & Plan Note (Signed)
Will continue allopurinol. 

## 2023-07-12 NOTE — ED Notes (Signed)
Metoprolol held due to heart rate being in 40s

## 2023-07-12 NOTE — Progress Notes (Signed)
Brief hospitalist update note.  This is a nonbillable note.  Please see scanned H&P for full billable details.  Briefly, this is a 67 year old male well-known to the hospital and ED services who presents for evaluation of chest pain.  Troponins elevated and flat.  No significant delta.  Cardiology consulted.  No plans for ischemic evaluation.  In sinus on telemetry.  Ventricular rates in the 40s.  Home dose metoprolol decreased to 12.5 mg daily.  Patient complains of substernal chest pain.  Unclear etiology.  Recent diagnosis of COVID.  No obvious pneumonia.  Procalcitonin negative.  No evidence of bacterial coinfection.  No indication for antibiotics.  Will continue bronchodilators.  Anticipate discharge 8/27.  Lolita Patella MD  No charge

## 2023-07-12 NOTE — Telephone Encounter (Signed)
Selina the clinical manager from [facility] called stated they have denied all home health services going further.

## 2023-07-12 NOTE — ED Provider Notes (Signed)
[facility] Provider Note    Event Date/Time   First MD Initiated Contact with Patient 07/12/23 0002     (approximate)   History   Chest Pain   HPI  Lawrence [name] is a 67 y.o. male   Past medical history of CAD, COPD on 2 L chronically, hypertension, diabetic, atrial fibrillation on Eliquis, who presents emergency department with worsening shortness of breath, cough, chest pain from recent diagnosis of COVID.  Known sick contacts.  Developed new chest pain central nonradiating today along with his respiratory symptoms.  He lives alone.   External Medical Documents Reviewed: Laboratory testing from earlier this week positive for COVID.      Physical Exam   Triage Vital Signs: ED Triage Vitals  Encounter Vitals Group     BP 07/11/23 2255 (!) 193/95     Systolic BP Percentile --      Diastolic BP Percentile --      Pulse Rate 07/11/23 2255 (!) 57     Resp 07/11/23 2255 20     Temp 07/11/23 2255 98.1 F (36.7 C)     Temp Source 07/11/23 2255 Oral     SpO2 07/11/23 2255 100 %     Weight --      Height --      Head Circumference --      Peak Flow --      Pain Score 07/11/23 2253 9     Pain Loc --      Pain Education --      Exclude from Growth Chart --     Most recent vital signs: Vitals:   07/12/23 0000 07/12/23 0030  BP: (!) 142/83 (!) 149/84  Pulse: (!) 56 (!) 56  Resp: 17 14  Temp:    SpO2: 100% 100%    General: Awake, no distress.  CV:  Good peripheral perfusion.  Resp:  Normal effort.  Abd:  No distention.  Other:  Chronically ill-appearing in no respiratory distress with normal vital signs, afebrile, clear lungs without wheezing or focality.  Oxygen levels 100% on his home 2 L O2.  Appears euvolemic.   ED Results / Procedures / Treatments   Labs (all labs ordered are listed, but only abnormal results are displayed) Labs Reviewed  BASIC METABOLIC PANEL - Abnormal; Notable for the following components:      Result Value    Glucose, Bld 118 (*)    All other components within normal limits  CBC - Abnormal; Notable for the following components:   Hemoglobin 12.6 (*)    RDW 16.6 (*)    All other components within normal limits  TROPONIN I (HIGH SENSITIVITY) - Abnormal; Notable for the following components:   Troponin I (High Sensitivity) 36 (*)    All other components within normal limits  TROPONIN I (HIGH SENSITIVITY)     I ordered and reviewed the above labs they are notable for troponins 36 from a baseline of 20s.  EKG  ED ECG REPORT I, Pilar Jarvis, the attending physician, personally viewed and interpreted this ECG.   Date: 07/12/2023  EKG Time: 2257  Rate: 60  Rhythm: nsr  Axis: nl  Intervals:none  ST&T Change: no stemi    RADIOLOGY I independently reviewed and interpreted chest x-ray and see no obvious focality or pneumothorax. I also reviewed radiologist's formal read.   PROCEDURES:  Critical Care performed: No  Procedures   MEDICATIONS ORDERED IN ED: Medications  oxyCODONE (Oxy IR/ROXICODONE) immediate  release tablet 5 mg (has no administration in time range)  acetaminophen (TYLENOL) tablet 650 mg (has no administration in time range)    External physician / consultants:  I spoke with hospitalist for admission and regarding care plan for this patient.   IMPRESSION / MDM / ASSESSMENT AND PLAN / ED COURSE  I reviewed the triage vital signs and the nursing notes.                                Patient's presentation is most consistent with acute presentation with potential threat to life or bodily function.  Differential diagnosis includes, but is not limited to, COVID-pneumonia, bacterial pneumonia, COPD exacerbation, ACS   The patient is on the cardiac monitor to evaluate for evidence of arrhythmia and/or significant heart rate changes.  MDM:    Patient with known COVID and worsening upper respiratory viral infectious symptoms with generalized fatigue, generalized  weakness, cough, shortness of breath and now new chest pain.  EKG without STEMI and initial troponin is not markedly elevated from prior in the 30s compared to baseline of 20s.  Repeat troponin pending.  Questionable developing opacity in the right lung field, no leukocytosis no fever so will defer antibiotics for the time being, hospital admission  Considered sepsis but less likely given normal vital signs, afebrile no leukocytosis.       FINAL CLINICAL IMPRESSION(S) / ED DIAGNOSES   Final diagnoses:  Nonspecific chest pain  COVID     Rx / DC Orders   ED Discharge Orders     None        Note:  This document was prepared using Dragon voice recognition software and may include unintentional dictation errors.    Pilar Jarvis, MD 07/12/23 [phone]

## 2023-07-12 NOTE — H&P (Signed)
[place]   PATIENT NAME: Lawrence [name]    MR#:  [id number]  DATE OF BIRTH:  [birth year]  DATE OF ADMISSION:  07/11/2023  PRIMARY CARE PHYSICIAN: Malva Limes, MD   Patient is coming from: Home  REQUESTING/REFERRING PHYSICIAN: Pilar Jarvis, MD  CHIEF COMPLAINT:   Chief Complaint  Patient presents with   Chest Pain    HISTORY OF PRESENT ILLNESS:  Lawrence [name] is a 67 y.o., Caucasian male with medical history significant for atrial fibrillation, asthma, coronary artery disease, status post two-vessel CABG, status post PCI and stents, type diabetes mellitus, COPD on home O2 at 2 L/min, he osteoarthritis and anxiety, who presented to the emergency room with acute onset of midsternal chest pain felt as an elephant sitting on his chest with associated nausea and diaphoresis.  He graded it 9/10 in severity and it was radiating to his left lower.  He admitted to recent cough productive of yellowish sputum as well as occasional wheezing and dyspnea.  He was seen in the ER 3 days ago and was given molnupiravir when he tested positive for COVID-19.  He stated that it was significantly weak before that and his weakness has improved on.  No fever or chills.  No loss of taste or smell.  No diarrhea or melena or bright red being pregnant.  No dysuria, oliguria or hematuria or flank pain.  ED Course: When he came to the ER, BP was 162/88 with heart rate of 52 with otherwise normal vital signs.  Heart rate is been down to 44.  Labs revealed unremarkable BMP.  I sensitive troponin was 36 and later 27.  Lactic acid was 1 L 0.9.  CBC showed hemoglobin 12.6 hematocrit 40. EKG as reviewed by me : Unremarkable BMP EKG showed normal sinus rhythm with a rate of 60 with Q waves anteroseptally and flattened and inverted T waves laterally. Imaging: 2 view chest x-ray showed increasing patchy airspace disease at the left lung base, atelectasis versus pneumonia as well as chronic peribronchial thickening  with stable cardiomegaly status post CABG.  The patient was given 4 baby aspirin and 5 mg of oxycodone.  He will be admitted to a an observation chronic telemetry bed for further evaluation and management. PAST MEDICAL HISTORY:   Past Medical History:  Diagnosis Date   A-fib (HCC)    Anemia    Anginal pain (HCC)    Anxiety    Arthritis    Asthma    CAD (coronary artery disease)    a. 2002 CABGx2 (LIMA->LAD, VG->VG->OM1);  b. 09/2012 DES->OM;  c. 03/2015 PTCA of LAD [facility]) in setting of atretic LIMA; d. 05/2015 Cath [facility]): nonobs dzs; e. 06/2015 Cath ([facility]): LM nl, LAD 45p/d ISR, 50d, D1/2 small, LCX 50p/d ISR, OM1 70ost, 30 ISR, VG->OM1 50ost, 53m, LIMA->LAD 99p/d - atretic, RCA dom, nl; f.cath 10/16: 40-50%(FFR 0.90) pLAD, 75% (FFR 0.77) mLAD s/p PCI/DES, oRCA 40% (FFR0.95)   Cancer (HCC)    SKIN CANCER ON BACK   Celiac disease    Chronic diastolic CHF (congestive heart failure) (HCC)    a. 06/2009 Echo: EF 60-65%, Gr 1 DD, triv AI, mildly dil LA, nl RV.   COPD (chronic obstructive pulmonary disease) (HCC)    a. Chronic bronchitis and emphysema.   DDD (degenerative disc disease), lumbar    Diverticulosis    Dysrhythmia    Essential hypertension    GERD (gastroesophageal reflux disease)    History of hiatal hernia  History of kidney stones    H/O   History of tobacco abuse    a. Quit 2014.   Myocardial infarction [facility]) 2002   4 STENTS   Pancreatitis    PSVT (paroxysmal supraventricular tachycardia)    a. 10/2012 Noted on Zio Patch.   Sleep apnea    LOST CORD TO CPAP -ONLY 02 @ BEDTIME   Tubular adenoma of colon    Type II diabetes mellitus (HCC)     PAST SURGICAL HISTORY:   Past Surgical History:  Procedure Laterality Date   BYPASS GRAFT     CARDIAC CATHETERIZATION N/A 07/12/2015   rocedure: Left Heart Cath and Cors/Grafts Angiography;  Surgeon: Lyn Records, MD;  Location: [facility] INVASIVE CV LAB;  Service: Cardiovascular;  Laterality: N/A;   CARDIAC CATHETERIZATION Right  10/07/2015   Procedure: Left Heart Cath and Cors/Grafts Angiography;  Surgeon: Laurier Nancy, MD;  Location: [facility] INVASIVE CV LAB;  Service: Cardiovascular;  Laterality: Right;   CARDIAC CATHETERIZATION N/A 04/06/2016   Procedure: Left Heart Cath and Coronary Angiography;  Surgeon: Alwyn Pea, MD;  Location: [facility] INVASIVE CV LAB;  Service: Cardiovascular;  Laterality: N/A;   CARDIAC CATHETERIZATION  04/06/2016   Procedure: Bypass Graft Angiography;  Surgeon: Alwyn Pea, MD;  Location: [facility] INVASIVE CV LAB;  Service: Cardiovascular;;   CARDIAC CATHETERIZATION N/A 11/02/2016   Procedure: Left Heart Cath and Cors/Grafts Angiography and possible PCI;  Surgeon: Alwyn Pea, MD;  Location: [facility] INVASIVE CV LAB;  Service: Cardiovascular;  Laterality: N/A;   CARDIAC CATHETERIZATION N/A 11/02/2016   Procedure: Coronary Stent Intervention;  Surgeon: Alwyn Pea, MD;  Location: [facility] INVASIVE CV LAB;  Service: Cardiovascular;  Laterality: N/A;   CARDIOVERSION N/A 06/29/2022   Procedure: CARDIOVERSION;  Surgeon: Lamar Blinks, MD;  Location: [facility] ORS;  Service: Cardiovascular;  Laterality: N/A;   CHOLECYSTECTOMY     CIRCUMCISION N/A 06/09/2019   Procedure: CIRCUMCISION ADULT;  Surgeon: Sondra Come, MD;  Location: [facility] ORS;  Service: Urology;  Laterality: N/A;   COLONOSCOPY WITH PROPOFOL N/A 04/01/2018   Procedure: COLONOSCOPY WITH PROPOFOL;  Surgeon: Scot Jun, MD;  Location: [facility] ENDOSCOPY;  Service: Endoscopy;  Laterality: N/A;   COLONOSCOPY WITH PROPOFOL N/A 05/01/2023   Procedure: COLONOSCOPY WITH PROPOFOL;  Surgeon: Toney Reil, MD;  Location: [facility] ENDOSCOPY;  Service: Gastroenterology;  Laterality: N/A;   ESOPHAGEAL DILATION     ESOPHAGOGASTRODUODENOSCOPY (EGD) WITH PROPOFOL N/A 04/01/2018   Procedure: ESOPHAGOGASTRODUODENOSCOPY (EGD) WITH PROPOFOL;  Surgeon: Scot Jun, MD;  Location: [facility] ENDOSCOPY;  Service: Endoscopy;  Laterality: N/A;    ESOPHAGOGASTRODUODENOSCOPY (EGD) WITH PROPOFOL N/A 05/01/2023   Procedure: ESOPHAGOGASTRODUODENOSCOPY (EGD) WITH PROPOFOL;  Surgeon: Toney Reil, MD;  Location: [facility] ENDOSCOPY;  Service: Gastroenterology;  Laterality: N/A;   GIVENS CAPSULE STUDY  05/01/2023   Procedure: GIVENS CAPSULE STUDY;  Surgeon: Toney Reil, MD;  Location: [facility] ENDOSCOPY;  Service: Gastroenterology;;   LEFT HEART CATH AND CORS/GRAFTS ANGIOGRAPHY N/A 06/12/2019   Procedure: LEFT HEART CATH AND CORS/GRAFTS ANGIOGRAPHY;  Surgeon: Dalia Heading, MD;  Location: [facility] INVASIVE CV LAB;  Service: Cardiovascular;  Laterality: N/A;   LEFT HEART CATH AND CORS/GRAFTS ANGIOGRAPHY N/A 03/11/2020   Procedure: LEFT HEART CATH AND CORS/GRAFTS ANGIOGRAPHY;  Surgeon: Marcina Millard, MD;  Location: [facility] INVASIVE CV LAB;  Service: Cardiovascular;  Laterality: N/A;   LEFT HEART CATH AND CORS/GRAFTS ANGIOGRAPHY N/A 05/01/2021   Procedure: LEFT HEART CATH AND CORS/GRAFTS ANGIOGRAPHY;  Surgeon: Lamar Blinks, MD;  Location: [facility] INVASIVE CV LAB;  Service: Cardiovascular;  Laterality: N/A;   LEFT HEART CATH AND CORS/GRAFTS ANGIOGRAPHY N/A 11/02/2022   Procedure: LEFT HEART CATH AND CORS/GRAFTS ANGIOGRAPHY;  Surgeon: Alwyn Pea, MD;  Location: [facility] INVASIVE CV LAB;  Service: Cardiovascular;  Laterality: N/A;   TONSILLECTOMY     VASCULAR SURGERY      SOCIAL HISTORY:   Social History   Tobacco Use   Smoking status: Former    Current packs/day: 0.00    Average packs/day: 3.0 packs/day for 50.0 years (150.0 ttl pk-yrs)    Types: Cigarettes    Start date: 04/23/1963    Quit date: 04/22/2013    Years since quitting: 10.2   Smokeless tobacco: Never   Tobacco comments:    Reports not smoking for approx 8 years.  Substance Use Topics   Alcohol use: No    Comment: remotely quit alcohol use. Hx of heavy alcohol use.    FAMILY HISTORY:   Family History  Problem Relation Age of Onset   Heart attack Mother     Depression Mother    Heart disease Mother    COPD Mother    Hypertension Mother    Heart attack Father    Diabetes Father    Depression Father    Heart disease Father    Cirrhosis Father    Parkinson's disease Brother     DRUG ALLERGIES:   Allergies  Allergen Reactions   Prednisone Other (See Comments) and Hypertension    Pt states that this medication puts him in A-fib    Demerol  [Meperidine Hcl]    Demerol [Meperidine] Hives   Jardiance [Empagliflozin] Other (See Comments)    Perineal pain   Sulfa Antibiotics Hives   Albuterol Sulfate [Albuterol] Palpitations and Other (See Comments)    Pt currently uses this medication.     Morphine Sulfate Nausea And Vomiting, Rash and Other (See Comments)    Pt states that he is only allergic to the tablet form of this medication.      REVIEW OF SYSTEMS:   ROS As per history of present illness. All pertinent systems were reviewed above. Constitutional, HEENT, cardiovascular, respiratory, GI, GU, musculoskeletal, neuro, psychiatric, endocrine, integumentary and hematologic systems were reviewed and are otherwise negative/unremarkable except for positive findings mentioned above in the HPI.   MEDICATIONS AT HOME:   Prior to Admission medications   Medication Sig Start Date End Date Taking? Authorizing Provider  Accu-Chek Softclix Lancets lancets Use as instructed to check sugar daily for type 2 diabetes. 03/03/21   Malva Limes, MD  acetaminophen (TYLENOL) 325 MG tablet Take 2 tablets (650 mg total) by mouth every 4 (four) hours as needed for headache or mild pain. 08/07/22   Rolly Salter, MD  albuterol (VENTOLIN HFA) 108 (90 Base) MCG/ACT inhaler INHALE 2 PUFFS BY MOUTH EVERY 6 HOURS AS NEEDED FOR SHORTNESS OF BREATH Patient not taking: Reported on 06/16/2023 12/02/21   Malva Limes, MD  allopurinol (ZYLOPRIM) 300 MG tablet TAKE 1 TABLET BY MOUTH TWICE A DAY 03/13/23   Malva Limes, MD  ALPRAZolam Prudy Feeler) 1 MG tablet Take 1  tablet (1 mg total) by mouth 3 (three) times daily as needed. 06/18/23   Willy Eddy, MD  amiodarone (PACERONE) 200 MG tablet Take 1 tablet (200 mg total) by mouth daily. 05/04/23   Enedina Finner, MD  atorvastatin (LIPITOR) 80 MG tablet Take 1 tablet (80 mg total) by mouth daily. Please schedule office visit  before any future refill. 11/19/22   Malva Limes, MD  Blood Glucose Calibration (ACCU-CHEK GUIDE CONTROL) LIQD Use with blood glucose monitor as directed 02/20/21   Malva Limes, MD  Blood Glucose Monitoring Suppl (ACCU-CHEK GUIDE) w/Device KIT Use to check blood sugars as directed 02/20/21   Malva Limes, MD  Budeson-Glycopyrrol-Formoterol (BREZTRI AEROSPHERE) 160-9-4.8 MCG/ACT AERO Inhale 2 puffs into the lungs 2 (two) times daily. 06/02/23   Malva Limes, MD  celecoxib (CELEBREX) 200 MG capsule Take 1 capsule (200 mg total) by mouth 2 (two) times daily as needed. Home med. 11/03/22   [name], MD  cetirizine (ZYRTEC) 10 MG tablet TAKE 1 TABLET BY MOUTH AT BEDTIME 03/12/23   Malva Limes, MD  clopidogrel (PLAVIX) 75 MG tablet Take 1 tablet (75 mg total) by mouth daily. Patient not taking: Reported on 06/16/2023 05/04/23   Enedina Finner, MD  dicyclomine (BENTYL) 10 MG capsule Take 1 capsule (10 mg total) by mouth 3 (three) times daily as needed (abdominal pain). Patient not taking: Reported on 06/16/2023 05/14/23   Phineas Semen, MD  ELIQUIS 5 MG TABS tablet TAKE 1 TABLET BY MOUTH TWICE A DAY 07/30/22   Malva Limes, MD  FARXIGA 10 MG TABS tablet Take 10 mg by mouth daily. 05/25/23   [provider]  glipiZIDE (GLUCOTROL) 5 MG tablet Take 1 tablet (5 mg total) by mouth daily as needed (sugar over 300). 03/16/23   Malva Limes, MD  glucose blood (ACCU-CHEK GUIDE) test strip Use as instructed to check sugar daily for type 2 diabetes. 03/03/21   Malva Limes, MD  iron polysaccharides (NIFEREX) 150 MG capsule Take 1 capsule (150 mg total) by mouth daily. 05/04/23   Enedina Finner, MD  isosorbide mononitrate (IMDUR) 120 MG 24 hr tablet Take 1 tablet (120 mg total) by mouth daily. 05/04/23   Enedina Finner, MD  lisinopril (ZESTRIL) 20 MG tablet Take 20 mg by mouth daily. 03/17/23   [provider]  meclizine (ANTIVERT) 25 MG tablet TAKE ONE TABLET BY THREE TIMES A DAY AS NEEDED FOR DIZZINESS 08/13/22   Malva Limes, MD  metFORMIN (GLUCOPHAGE-XR) 500 MG 24 hr tablet TAKE 1 TABLET BY MOUTH TWICE A DAY 10/05/22   Malva Limes, MD  methocarbamol (ROBAXIN) 500 MG tablet Take 500 mg by mouth every 8 (eight) hours as needed for muscle spasms. Take 1 tablet by mouth every hour as needed for muscle spasms    [name], Demetrios Isaacs, MD  metoprolol succinate (TOPROL-XL) 25 MG 24 hr tablet Take 0.5 tablets (12.5 mg total) by mouth daily. 05/19/23   Sunnie Nielsen, DO  molnupiravir EUA (LAGEVRIO) 200 MG CAPS capsule Take 4 capsules (800 mg total) by mouth 2 (two) times daily for 5 days. 07/09/23 07/14/23  [name], MD  nitroGLYCERIN (NITROSTAT) 0.4 MG SL tablet Place 1 tablet (0.4 mg total) under the tongue every 5 (five) minutes as needed for chest pain. 09/10/22   Alfredia Ferguson, PA-C  omega-3 acid ethyl esters (LOVAZA) 1 g capsule TAKE 4 CAPSULES (4 GRAMS TOTAL) BY MOUTHDAILY 07/09/23   Malva Limes, MD  ondansetron (ZOFRAN) 4 MG tablet Take 1 tablet (4 mg total) by mouth every 8 (eight) hours as needed. 05/14/23   Phineas Semen, MD  oxyCODONE (ROXICODONE) 5 MG immediate release tablet Take 1 tablet (5 mg total) by mouth every 8 (eight) hours as needed. 06/18/23 06/17/24  Willy Eddy, MD  oxyCODONE-acetaminophen (PERCOCET) 10-325 MG tablet  Take 1 tablet by mouth every 4 (four) hours as needed. for pain 06/18/23   Willy Eddy, MD  pantoprazole (PROTONIX) 40 MG tablet Take 1 tablet (40 mg total) by mouth 2 (two) times daily for 14 days. 06/08/23 06/22/23  Celso Amy, PA-C  ranolazine (RANEXA) 1000 MG SR tablet Take 1 tablet (1,000 mg total) by mouth 2 (two) times  daily. 02/22/23   Arnetha Courser, MD  torsemide (DEMADEX) 100 MG tablet Take 0.5 tablets (50 mg total) by mouth daily. 09/10/22   Alfredia Ferguson, PA-C  traZODone (DESYREL) 150 MG tablet TAKE 1 TABLET BY MOUTH AT BEDTIME 11/24/22   Malva Limes, MD  venlafaxine XR (EFFEXOR-XR) 75 MG 24 hr capsule TAKE 1 CAPSULE BY MOUTH DAILY WITH BREAKFAST 02/23/23   Malva Limes, MD      VITAL SIGNS:  Blood pressure 102/68, pulse (!) 44, temperature 98.1 F (36.7 C), temperature source Oral, resp. rate 13, SpO2 99%.  PHYSICAL EXAMINATION:  Physical Exam  GENERAL:  67 y.o.-year-old patient lying in the bed with no acute distress.  EYES: Pupils equal, round, reactive to light and accommodation. No scleral icterus. Extraocular muscles intact.  HEENT: Head atraumatic, normocephalic. Oropharynx and nasopharynx clear.  NECK:  Supple, no jugular venous distention. No thyroid enlargement, no tenderness.  LUNGS: Diminished left basal breath sounds with left basal crackles.. No use of accessory muscles of respiration.  CARDIOVASCULAR: Regular rate and rhythm, S1, S2 normal. No murmurs, rubs, or gallops.  ABDOMEN: Soft, nondistended, nontender. Bowel sounds present. No organomegaly or mass.  EXTREMITIES: No pedal edema, cyanosis, or clubbing.  NEUROLOGIC: Cranial nerves II through XII are intact. Muscle strength 5/5 in all extremities. Sensation intact. Gait not checked.  PSYCHIATRIC: The patient is alert and oriented x 3.  Normal affect and good eye contact. SKIN: No obvious rash, lesion, or ulcer.   LABORATORY PANEL:   CBC Recent Labs  Lab 07/12/23 0415  WBC 5.6  HGB 11.9*  HCT 38.0*  PLT 142*   ------------------------------------------------------------------------------------------------------------------  Chemistries  Recent Labs  Lab 07/12/23 0415  NA 139  K 4.2  CL 107  CO2 26  GLUCOSE 109*  BUN 10  CREATININE 0.70  CALCIUM 8.6*    ------------------------------------------------------------------------------------------------------------------  Cardiac Enzymes No results for input(s): "TROPONINI" in the last 168 hours. ------------------------------------------------------------------------------------------------------------------  RADIOLOGY:  DG Chest Port 1 View  Result Date: 07/11/2023 CLINICAL DATA:  Left-sided chest pain. Increased shortness of breath. Cough. COVID positive. EXAM: PORTABLE CHEST 1 VIEW COMPARISON:  Radiograph 2 days ago 07/09/2023 FINDINGS: Prior median sternotomy. Stable cardiomegaly post CABG. Chronic peribronchial thickening. Insert increase in patchy airspace disease at the left lung base. No pneumothorax or large pleural effusion. No acute osseous findings. IMPRESSION: 1. Increasing patchy airspace disease at the left lung base, atelectasis versus pneumonia. 2. Chronic peribronchial thickening. Stable cardiomegaly post CABG. Electronically Signed   By: Narda Rutherford M.D.   On: 07/11/2023 23:26      IMPRESSION AND PLAN:  Assessment and Plan: * Chest pain, unspecified - The patient will be admitted to an observation cardiac telemetry bed. - Will follow serial troponins and EKGs. - The patient will be placed on aspirin as well as p.r.n. sublingual nitroglycerin and morphine sulfate for pain. - We will obtain a cardiology consult in a.m. for further cardiac risk stratification. - I notified Dr. Melton Alar about the patient.   Community acquired pneumonia - This is suspicious on the left base. - We will place him on IV Rocephin and  Zithromax patient given his history of COPD and productive purulent cough. - Mucolytic therapy and bronchodilator therapy will be provided. - We will follow blood cultures.  Essential hypertension - We will continue his antihypertensive therapy.  Paroxysmal atrial fibrillation (HCC) - We will continue Eliquis and amiodarone.  Type 2 diabetes mellitus  without complications (HCC) - The patient will be placed on supplement coverage with NovoLog. -Will continue [place]. - We will hold off metformin.  Gout - Will continue allopurinol.  Dyslipidemia - We will continue statin therapy.  Chronic obstructive pulmonary disease (COPD) (HCC) - He will be placed on as needed DuoNebs and will hold off long-acting beta agonist while ruling out ACS.   DVT prophylaxis: Eliquis. Advanced Care Planning:  Code Status: full code the patient is DNR only. Family Communication:  The plan of care was discussed in details with the patient (and family). I answered all questions. The patient agreed to proceed with the above mentioned plan. Further management will depend upon hospital course. Disposition Plan: Back to previous home environment Consults called: Cardiology. All the records are reviewed and case discussed with ED provider.  Status is: Observation  I certify that at the time of admission, it is my clinical judgment that the patient will require  hospital care extending less than 2 midnights.                            Dispo: The patient is from: Home              Anticipated d/c is to: Home              Patient currently is not medically stable to d/c.              Difficult to place patient: No  Hannah Beat M.D on 07/12/2023 at 4:47 AM  [facility]   From 7 PM-7 AM, contact night-coverage [email or web address]  CC: Primary care physician; Malva Limes, MD

## 2023-07-12 NOTE — Assessment & Plan Note (Signed)
-   We will continue statin therapy. 

## 2023-07-12 NOTE — Consult Note (Cosign Needed Addendum)
[facility] CLINIC CARDIOLOGY CONSULT NOTE       Patient ID: Lawrence [name] MRN: [id number] DOB/AGE: 05/18/1956 67 y.o.  Admit date: 07/11/2023 Referring Physician Dr. Valente David Primary Physician Dr. [name] Primary Cardiologist Dr. Dorothyann Peng Reason for Consultation chest pain  HPI: Lawrence [name] is a 67 y.o. male  with a past medical history of CAD (h/o STEMI s/p CABG x2 LIMA-LAD and SVG-OM1 2002 c/b coronary artery dissection with multiple subsequent PCI and stents), chronic stable angina, HFpEF (50-55%, mild LVH 10/2022) paroxysmal atrial fibrillation s/p unsuccessful DCCV 06/29/2022, COPD (PRN 2L), hx tobacco use, DM2  who presented to the ED on 07/11/2023 for worsening SOB, cough, chest pain. He was recently seen in the ED on 8/23 and was diagnosed with COVID at that time. Cardiology consulted for further evaluation of chest pain, rule out ACS.   Patient states that last week he was diagnosed with COVID-19.  Reports that he has noticed worsening shortness of breath, sneezing, runny nose over the last few days.  He reports that last night he began experiencing central chest pain associated with this respiratory symptoms and decided to come to the ED for evaluation.  He reports that the pain feels like "pressure" and is worse with deep breathing and coughing.  Denies any exertional symptoms but overall has been fatigued since he was diagnosed with COVID.  Has worsening shortness of breath as well and did increase his supplemental oxygen at home to 3 L prior to coming to the ED for evaluation which did help his breathing.  Workup in the ED notable for troponins trending 36 > 27 > 30.  EKG nonacute, stable from prior.  Creatinine 0.7, potassium 4.2, hemoglobin 11.9.  He reports that he has been tolerating his home medications well and denies any issues with compliance.  At the time of my evaluation this morning he is resting comfortably in ED stretcher on 1 L of supplemental oxygen by  nasal cannula.  States that his chest pain comes and goes, this is generally worse with coughing.  Patient noted to be bradycardic on telemetry, he denies any dizziness symptoms.  His main complaints are his respiratory symptoms, he is asking for his pain meds at the time of my evaluation.  Review of systems complete and found to be negative unless listed above     Past Medical History:  Diagnosis Date   A-fib (HCC)    Anemia    Anginal pain (HCC)    Anxiety    Arthritis    Asthma    CAD (coronary artery disease)    a. 2002 CABGx2 (LIMA->LAD, VG->VG->OM1);  b. 09/2012 DES->OM;  c. 03/2015 PTCA of LAD [facility]) in setting of atretic LIMA; d. 05/2015 Cath [facility]): nonobs dzs; e. 06/2015 Cath ([facility]): LM nl, LAD 45p/d ISR, 50d, D1/2 small, LCX 50p/d ISR, OM1 70ost, 30 ISR, VG->OM1 50ost, 53m, LIMA->LAD 99p/d - atretic, RCA dom, nl; f.cath 10/16: 40-50%(FFR 0.90) pLAD, 75% (FFR 0.77) mLAD s/p PCI/DES, oRCA 40% (FFR0.95)   Cancer (HCC)    SKIN CANCER ON BACK   Celiac disease    Chronic diastolic CHF (congestive heart failure) (HCC)    a. 06/2009 Echo: EF 60-65%, Gr 1 DD, triv AI, mildly dil LA, nl RV.   COPD (chronic obstructive pulmonary disease) (HCC)    a. Chronic bronchitis and emphysema.   DDD (degenerative disc disease), lumbar    Diverticulosis    Dysrhythmia    Essential hypertension    GERD (  gastroesophageal reflux disease)    History of hiatal hernia    History of kidney stones    H/O   History of tobacco abuse    a. Quit 2014.   Myocardial infarction [facility]) 2002   4 STENTS   Pancreatitis    PSVT (paroxysmal supraventricular tachycardia)    a. 10/2012 Noted on [facility].   Sleep apnea    LOST CORD TO CPAP -ONLY 02 @ BEDTIME   Tubular adenoma of colon    Type II diabetes mellitus (HCC)     Past Surgical History:  Procedure Laterality Date   BYPASS GRAFT     CARDIAC CATHETERIZATION N/A 07/12/2015   rocedure: Left Heart Cath and Cors/Grafts Angiography;  Surgeon: Lyn Records,  MD;  Location: [facility] INVASIVE CV LAB;  Service: Cardiovascular;  Laterality: N/A;   CARDIAC CATHETERIZATION Right 10/07/2015   Procedure: Left Heart Cath and Cors/Grafts Angiography;  Surgeon: Laurier Nancy, MD;  Location: [facility] INVASIVE CV LAB;  Service: Cardiovascular;  Laterality: Right;   CARDIAC CATHETERIZATION N/A 04/06/2016   Procedure: Left Heart Cath and Coronary Angiography;  Surgeon: Alwyn Pea, MD;  Location: [facility] INVASIVE CV LAB;  Service: Cardiovascular;  Laterality: N/A;   CARDIAC CATHETERIZATION  04/06/2016   Procedure: Bypass Graft Angiography;  Surgeon: Alwyn Pea, MD;  Location: [facility] INVASIVE CV LAB;  Service: Cardiovascular;;   CARDIAC CATHETERIZATION N/A 11/02/2016   Procedure: Left Heart Cath and Cors/Grafts Angiography and possible PCI;  Surgeon: Alwyn Pea, MD;  Location: [facility] INVASIVE CV LAB;  Service: Cardiovascular;  Laterality: N/A;   CARDIAC CATHETERIZATION N/A 11/02/2016   Procedure: Coronary Stent Intervention;  Surgeon: Alwyn Pea, MD;  Location: [facility] INVASIVE CV LAB;  Service: Cardiovascular;  Laterality: N/A;   CARDIOVERSION N/A 06/29/2022   Procedure: CARDIOVERSION;  Surgeon: Lamar Blinks, MD;  Location: [facility] ORS;  Service: Cardiovascular;  Laterality: N/A;   CHOLECYSTECTOMY     CIRCUMCISION N/A 06/09/2019   Procedure: CIRCUMCISION ADULT;  Surgeon: Sondra Come, MD;  Location: [facility] ORS;  Service: Urology;  Laterality: N/A;   COLONOSCOPY WITH PROPOFOL N/A 04/01/2018   Procedure: COLONOSCOPY WITH PROPOFOL;  Surgeon: Scot Jun, MD;  Location: [facility] ENDOSCOPY;  Service: Endoscopy;  Laterality: N/A;   COLONOSCOPY WITH PROPOFOL N/A 05/01/2023   Procedure: COLONOSCOPY WITH PROPOFOL;  Surgeon: Toney Reil, MD;  Location: [facility] ENDOSCOPY;  Service: Gastroenterology;  Laterality: N/A;   ESOPHAGEAL DILATION     ESOPHAGOGASTRODUODENOSCOPY (EGD) WITH PROPOFOL N/A 04/01/2018   Procedure: ESOPHAGOGASTRODUODENOSCOPY (EGD) WITH  PROPOFOL;  Surgeon: Scot Jun, MD;  Location: [facility] ENDOSCOPY;  Service: Endoscopy;  Laterality: N/A;   ESOPHAGOGASTRODUODENOSCOPY (EGD) WITH PROPOFOL N/A 05/01/2023   Procedure: ESOPHAGOGASTRODUODENOSCOPY (EGD) WITH PROPOFOL;  Surgeon: Toney Reil, MD;  Location: [facility] ENDOSCOPY;  Service: Gastroenterology;  Laterality: N/A;   GIVENS CAPSULE STUDY  05/01/2023   Procedure: GIVENS CAPSULE STUDY;  Surgeon: Toney Reil, MD;  Location: [facility] ENDOSCOPY;  Service: Gastroenterology;;   LEFT HEART CATH AND CORS/GRAFTS ANGIOGRAPHY N/A 06/12/2019   Procedure: LEFT HEART CATH AND CORS/GRAFTS ANGIOGRAPHY;  Surgeon: Dalia Heading, MD;  Location: [facility] INVASIVE CV LAB;  Service: Cardiovascular;  Laterality: N/A;   LEFT HEART CATH AND CORS/GRAFTS ANGIOGRAPHY N/A 03/11/2020   Procedure: LEFT HEART CATH AND CORS/GRAFTS ANGIOGRAPHY;  Surgeon: Marcina Millard, MD;  Location: [facility] INVASIVE CV LAB;  Service: Cardiovascular;  Laterality: N/A;   LEFT HEART CATH AND CORS/GRAFTS ANGIOGRAPHY N/A 05/01/2021   Procedure: LEFT HEART CATH AND  CORS/GRAFTS ANGIOGRAPHY;  Surgeon: Lamar Blinks, MD;  Location: [facility] INVASIVE CV LAB;  Service: Cardiovascular;  Laterality: N/A;   LEFT HEART CATH AND CORS/GRAFTS ANGIOGRAPHY N/A 11/02/2022   Procedure: LEFT HEART CATH AND CORS/GRAFTS ANGIOGRAPHY;  Surgeon: Alwyn Pea, MD;  Location: [facility] INVASIVE CV LAB;  Service: Cardiovascular;  Laterality: N/A;   TONSILLECTOMY     VASCULAR SURGERY      (Not in a hospital admission)  Social History   Socioeconomic History   Marital status: Widowed    Spouse name: Not on file   Number of children: 1   Years of education: Not on file   Highest education level: 10th grade  Occupational History   Occupation: Disabled   Occupation: on Tree surgeon  Tobacco Use   Smoking status: Former    Current packs/day: 0.00    Average packs/day: 3.0 packs/day for 50.0 years (150.0 ttl pk-yrs)    Types: Cigarettes     Start date: 04/23/1963    Quit date: 04/22/2013    Years since quitting: 10.2   Smokeless tobacco: Never   Tobacco comments:    Reports not smoking for approx 8 years.  Vaping Use   Vaping status: Never Used  Substance and Sexual Activity   Alcohol use: No    Comment: remotely quit alcohol use. Hx of heavy alcohol use.   Drug use: Not Currently    Types: Marijuana   Sexual activity: Not Currently  Other Topics Concern   Not on file  Social History Narrative   Pt lives in [place] with wife.  Does not routinely exercise.   Social Determinants of Health   Financial Resource Strain: Low Risk  (03/10/2023)   Overall Financial Resource Strain (CARDIA)    Difficulty of Paying Living Expenses: Not hard at all  Food Insecurity: No Food Insecurity (05/19/2023)   Hunger Vital Sign    Worried About Running Out of Food in the Last Year: Never true    Ran Out of Food in the Last Year: Never true  Transportation Needs: No Transportation Needs (05/19/2023)   PRAPARE - Administrator, Civil Service (Medical): No    Lack of Transportation (Non-Medical): No  Physical Activity: Inactive (03/10/2023)   Exercise Vital Sign    Days of Exercise per Week: 0 days    Minutes of Exercise per Session: 0 min  Stress: No Stress Concern Present (03/10/2023)   [employer] of Occupational Health - Occupational Stress Questionnaire    Feeling of Stress : Not at all  Social Connections: Moderately Isolated (03/10/2023)   Social Connection and Isolation Panel [NHANES]    Frequency of Communication with Friends and Family: More than three times a week    Frequency of Social Gatherings with Friends and Family: More than three times a week    Attends Religious Services: More than 4 times per year    Active Member of [employer] or Organizations: No    Attends Banker Meetings: Not on file    Marital Status: Widowed  Intimate Partner Violence: Not At Risk (05/17/2023)   Humiliation, Afraid, Rape,  and Kick questionnaire    Fear of Current or Ex-Partner: No    Emotionally Abused: No    Physically Abused: No    Sexually Abused: No    Family History  Problem Relation Age of Onset   Heart attack Mother    Depression Mother    Heart disease Mother    COPD Mother  Hypertension Mother    Heart attack Father    Diabetes Father    Depression Father    Heart disease Father    Cirrhosis Father    Parkinson's disease Brother      Vitals:   07/12/23 0530 07/12/23 0600 07/12/23 0630 07/12/23 0901  BP: 103/68 99/65 107/65 (!) 148/82  Pulse: (!) 46 (!) 44 (!) 43   Resp: 13 12 11    Temp:      TempSrc:      SpO2: 93% 95% 100%     PHYSICAL EXAM General: Chronically ill-appearing male, well nourished, in no acute distress laying nearly flat in ED stretcher. HEENT: Normocephalic and atraumatic. Neck: No JVD.  Lungs: Normal respiratory effort on 1L [place]. Clear bilaterally to auscultation. No wheezes, crackles, rhonchi.  Heart: Slow rate regular rhythm. Normal S1 and S2 without gallops or murmurs.  Abdomen: Non-distended appearing.  Msk: Normal strength and tone for age. Extremities: Warm and well perfused. No clubbing, cyanosis. No edema.  Neuro: Alert and oriented X 3. Psych: Answers questions appropriately.   Labs: Basic Metabolic Panel: Recent Labs    07/11/23 2257 07/12/23 0415  NA 138 139  K 4.1 4.2  CL 106 107  CO2 26 26  GLUCOSE 118* 109*  BUN 9 10  CREATININE 0.76 0.70  CALCIUM 9.1 8.6*   Liver Function Tests: No results for input(s): "AST", "ALT", "ALKPHOS", "BILITOT", "PROT", "ALBUMIN" in the last 72 hours. No results for input(s): "LIPASE", "AMYLASE" in the last 72 hours. CBC: Recent Labs    07/11/23 2257 07/12/23 0415  WBC 6.7 5.6  HGB 12.6* 11.9*  HCT 40.1 38.0*  MCV 91.1 92.0  PLT 159 142*   Cardiac Enzymes: Recent Labs    07/11/23 2257 07/12/23 0048 07/12/23 0415  TROPONINIHS 36* 27* 30*   BNP: No results for input(s): "BNP" in the last  72 hours. D-Dimer: Recent Labs    07/11/23 2257  DDIMER 1.22*   Hemoglobin A1C: No results for input(s): "HGBA1C" in the last 72 hours. Fasting Lipid Panel: No results for input(s): "CHOL", "HDL", "LDLCALC", "TRIG", "CHOLHDL", "LDLDIRECT" in the last 72 hours. Thyroid Function Tests: No results for input(s): "TSH", "T4TOTAL", "T3FREE", "THYROIDAB" in the last 72 hours.  Invalid input(s): "FREET3" Anemia Panel: No results for input(s): "VITAMINB12", "FOLATE", "FERRITIN", "TIBC", "IRON", "RETICCTPCT" in the last 72 hours.   Radiology: [facility] Chest Port 1 View  Result Date: 07/11/2023 CLINICAL DATA:  Left-sided chest pain. Increased shortness of breath. Cough. COVID positive. EXAM: PORTABLE CHEST 1 VIEW COMPARISON:  Radiograph 2 days ago 07/09/2023 FINDINGS: Prior median sternotomy. Stable cardiomegaly post CABG. Chronic peribronchial thickening. Insert increase in patchy airspace disease at the left lung base. No pneumothorax or large pleural effusion. No acute osseous findings. IMPRESSION: 1. Increasing patchy airspace disease at the left lung base, atelectasis versus pneumonia. 2. Chronic peribronchial thickening. Stable cardiomegaly post CABG. Electronically Signed   By: Narda Rutherford M.D.   On: 07/11/2023 23:26   DG Chest 2 View  Result Date: 07/09/2023 CLINICAL DATA:  Chest pain. EXAM: CHEST - 2 VIEW COMPARISON:  Chest radiograph 06/15/2023, 06/12/2023, 04/27/2023 FINDINGS: Status post median sternotomy. Cardiac silhouette is again at the upper limits of normal size. Mediastinal contours are within limits with mild-to-moderate calcification within the aortic arch. There is again flattening of the diaphragms and mild-to-moderate hyperinflation. There is again cephalization of the pulmonary vasculature as can be seen with pulmonary venous hypertension. Minimal bilateral interstitial thickening is similar to 06/15/2023. Chronic left-greater-than-right  basilar linear scarring is unchanged from  multiple prior radiographs. No pleural effusion or pneumothorax. Moderate multilevel degenerative disc changes of the thoracic spine. Cholecystectomy clips. IMPRESSION: 1. No significant interval change. Findings suggestive of pulmonary venous hypertension. No overt pulmonary edema. 2. Chronic hyperinflation compatible with COPD. Electronically Signed   By: Neita Garnet M.D.   On: 07/09/2023 13:52   VAS [place] ABI WITH/WO TBI  Result Date: 07/05/2023  LOWER EXTREMITY DOPPLER STUDY Patient Name:  FAITH BETSILL  Date of Exam:   07/05/2023 Medical Rec #: [id number]        Accession #:    [id number] Date of Birth: 02-12-1956        Patient Gender: M Patient Age:   71 years Exam Location:  [facility] Procedure:      VAS [place] ABI WITH/WO TBI Referring Phys: --------------------------------------------------------------------------------  Indications: Rest pain. High Risk Factors: Diabetes.  Comparison Study: Screening at PCP suggested abnormal flow left leg Performing Technologist: Salvadore Farber RVT  Examination Guidelines: A complete evaluation includes at minimum, Doppler waveform signals and systolic blood pressure reading at the level of bilateral brachial, anterior tibial, and posterior tibial arteries, when vessel segments are accessible. Bilateral testing is considered an integral part of a complete examination. Photoelectric Plethysmograph (PPG) waveforms and toe systolic pressure readings are included as required and additional duplex testing as needed. Limited examinations for reoccurring indications may be performed as noted.  ABI Findings: +---------+------------------+-----+---------+--------+ Right    Rt Pressure (mmHg)IndexWaveform Comment  +---------+------------------+-----+---------+--------+ Brachial 167                                      +---------+------------------+-----+---------+--------+ ATA      189               1.13 triphasic          +---------+------------------+-----+---------+--------+ PTA      184               1.10 triphasic         +---------+------------------+-----+---------+--------+ Great Toe137               0.82 Normal            +---------+------------------+-----+---------+--------+ +---------+------------------+-----+---------+-------+ Left     Lt Pressure (mmHg)IndexWaveform Comment +---------+------------------+-----+---------+-------+ Brachial 165                                     +---------+------------------+-----+---------+-------+ ATA      184               1.10 biphasic         +---------+------------------+-----+---------+-------+ PTA      173               1.04 triphasic        +---------+------------------+-----+---------+-------+ Great Toe146               0.87 Normal           +---------+------------------+-----+---------+-------+  Summary: Right: Resting right ankle-brachial index is within normal range. The right toe-brachial index is normal. Left: Resting left ankle-brachial index is within normal range. The left toe-brachial index is normal. *See table(s) above for measurements and observations.  Electronically signed by Levora Dredge MD on 07/05/2023 at 5:47:27 PM.    Final    DG Pelvis 1-2 Views  Result Date: 06/15/2023 CLINICAL DATA:  Fall. EXAM: PELVIS - 1-2 VIEW COMPARISON:  Pelvic radiograph dated 11/02/2005. FINDINGS: No acute fracture or dislocation. Mild osteopenia and mild arthritic changes of the hips. Soft tissues are unremarkable. Vascular calcifications noted. IMPRESSION: No acute fracture or dislocation. Electronically Signed   By: Elgie Collard M.D.   On: 06/15/2023 22:55   DG Chest 1 View  Result Date: 06/15/2023 CLINICAL DATA:  Chest pain. EXAM: CHEST  1 VIEW COMPARISON:  Chest radiograph dated 06/12/2023. FINDINGS: Mild cardiomegaly with mild central vascular congestion. No focal consolidation, pleural effusion, or pneumothorax.  Atherosclerotic calcification of the aorta. Median sternotomy wires and CABG vascular clips. No acute osseous pathology. IMPRESSION: Mild cardiomegaly with mild central vascular congestion. No focal consolidation. Electronically Signed   By: Elgie Collard M.D.   On: 06/15/2023 22:54   CT Maxillofacial Wo Contrast  Result Date: 06/15/2023 CLINICAL DATA:  Status post fall. EXAM: CT MAXILLOFACIAL WITHOUT CONTRAST TECHNIQUE: Multidetector CT imaging of the maxillofacial structures was performed. Multiplanar CT image reconstructions were also generated. RADIATION DOSE REDUCTION: This exam was performed according to the departmental dose-optimization program which includes automated exposure control, adjustment of the mA and/or kV according to patient size and/or use of iterative reconstruction technique. COMPARISON:  None Available. FINDINGS: Osseous: No fracture or mandibular dislocation. No destructive process. Orbits: Negative. No traumatic or inflammatory finding. Sinuses: Clear. Soft tissues: Negative. Limited intracranial: No significant or unexpected finding. IMPRESSION: No acute facial fracture. Electronically Signed   By: Aram Candela M.D.   On: 06/15/2023 22:46   CT Cervical Spine Wo Contrast  Result Date: 06/15/2023 CLINICAL DATA:  Status post fall. EXAM: CT CERVICAL SPINE WITHOUT CONTRAST TECHNIQUE: Multidetector CT imaging of the cervical spine was performed without intravenous contrast. Multiplanar CT image reconstructions were also generated. RADIATION DOSE REDUCTION: This exam was performed according to the departmental dose-optimization program which includes automated exposure control, adjustment of the mA and/or kV according to patient size and/or use of iterative reconstruction technique. COMPARISON:  February 26, 2023 FINDINGS: Alignment: There is straightening of the normal cervical spine lordosis. Skull base and vertebrae: No acute fracture. No primary bone lesion or focal pathologic  process. Soft tissues and spinal canal: No prevertebral fluid or swelling. No visible canal hematoma. Disc levels:  Mild multilevel endplate sclerosis is seen. There is marked severity narrowing of the anterior atlantoaxial articulation. Mild multilevel intervertebral disc space narrowing is noted throughout the remainder of the cervical spine. Bilateral mild-to-moderate severity multilevel facet joint hypertrophy is noted. Upper chest: Negative. Other: Multiple sternal wires are seen. IMPRESSION: 1. No acute cervical spine fracture. 2. Multilevel degenerative changes, as described above. Electronically Signed   By: Aram Candela M.D.   On: 06/15/2023 22:43   CT Head Wo Contrast  Result Date: 06/15/2023 CLINICAL DATA:  Status post fall. EXAM: CT HEAD WITHOUT CONTRAST TECHNIQUE: Contiguous axial images were obtained from the base of the skull through the vertex without intravenous contrast. RADIATION DOSE REDUCTION: This exam was performed according to the departmental dose-optimization program which includes automated exposure control, adjustment of the mA and/or kV according to patient size and/or use of iterative reconstruction technique. COMPARISON:  None Available. FINDINGS: Brain: There is mild cerebral atrophy with widening of the extra-axial spaces and ventricular dilatation. There are areas of decreased attenuation within the white matter tracts of the supratentorial brain, consistent with microvascular disease changes. Vascular: No hyperdense vessel or unexpected calcification. Skull: Normal. Negative for fracture or focal lesion. Sinuses/Orbits: No  acute finding. Other: None. IMPRESSION: 1. Generalized cerebral atrophy with mild, chronic white matter small vessel ischemic changes. 2. No acute intracranial abnormality. Electronically Signed   By: Aram Candela M.D.   On: 06/15/2023 22:41    ECHO 05/01/2023:  1. Left ventricular ejection fraction, by estimation, is 50 to 55%. The  left  ventricle has low normal function. The left ventricle has no regional  wall motion abnormalities. There is mild concentric left ventricular  hypertrophy. Left ventricular diastolic parameters are consistent with Grade III diastolic dysfunction (restrictive).   2. Right ventricular systolic function is normal. The right ventricular  size is normal.   3. Left atrial size was mild to moderately dilated.   4. Right atrial size was mildly dilated.   5. The mitral valve is normal in structure. Trivial mitral valve regurgitation.   6. The aortic valve is normal in structure. Aortic valve regurgitation is trivial. Aortic valve sclerosis is present, with no evidence of aortic valve stenosis.   TELEMETRY reviewed by me [facility]) 07/12/2023 : sinus bradycardia rate 40s  EKG reviewed by me: NSR rate 60 bpm, nonacute  Data reviewed by me [facility]) 07/12/2023: last 24h vitals tele labs imaging I/O ED provider note, admission H&P  Principal Problem:   Chest pain, unspecified Active Problems:   Chronic obstructive pulmonary disease (COPD) (HCC)   Dyslipidemia   Paroxysmal atrial fibrillation (HCC)   Gout   Type 2 diabetes mellitus without complications (HCC)   Essential hypertension   Community acquired pneumonia    ASSESSMENT AND PLAN:  Lawrence [name] is a 67 y.o. male  with a past medical history of CAD (h/o STEMI s/p CABG x2 LIMA-LAD and SVG-OM1 2002 c/b coronary artery dissection with multiple subsequent PCI and stents), chronic stable angina, HFpEF (50-55%, mild LVH 10/2022) paroxysmal atrial fibrillation s/p unsuccessful DCCV 06/29/2022, COPD (PRN 2L), hx tobacco use, DM2  who presented to the ED on 07/11/2023 for worsening SOB, cough, chest pain. He was recently seen in the ED on 8/23 and was diagnosed with COVID at that time. Cardiology consulted for further evaluation of chest pain, rule out ACS.   # Chest pain # CAD s/p CABG with chronic angina # Demand ischemia Patient with 1 day of worsening CP in  the setting of Covid infection. Pain worse with deep breathing, coughing. Troponins 36 > 27 > 30.  Borderline elevated and flat, most consistent with demand/supply mismatch and not ACS. EKG nonacute. -Continue home ranexa 1000 mg bid, imdur 120 mg daily. -Continue home atorvastatin 80 mg daily and plavix 75 mg daily. -Continue lisinopril 20 mg daily. -No plan for further cardiac diagnostics at this time.  # Paroxysmal atrial fibrillation # Bradycardia In sinus on telemetry this AM with rates in the 40s, home dose of metoprolol was decreased during his last hospitalization for bradycardia.  -Will plan to monitor bradycardia without changes to medications, with hold parameters in place for metoprolol. -Continue home amiodarone 200 mg daily -Hold parameters added for home metoprolol 12.5 mg daily. -Continue eliquis 5 mg twice daily for stroke risk reduction.  # Community acquired pneumonia # Covid infection # Chronic obstructive pulmonary disease Patient diagnosed with Covid on 8/23 now presenting with worsening SOB, cough, chest pain. CXR with questionable LLL consolidation.  -Continue oxygen supplementation as needed, on 1L [place] at the time of my evaluation this AM.  -Antibiotics and nebs per primary   This patient's plan of care was discussed and created with Dr. Darrold Junker and he  is in agreement.  Signed: Cheryl Flash, PA-C 07/12/2023, 10:01 AM [facility]

## 2023-07-12 NOTE — ED Notes (Signed)
Patient's O2 sitting between 89-91% while patient is resting. Patient placed on 2L nasal cannula.

## 2023-07-12 NOTE — Assessment & Plan Note (Signed)
-   We will continue Eliquis and amiodarone. 

## 2023-07-12 NOTE — ED Notes (Signed)
Pt's HR 39, Dr. Georgeann Oppenheim notified.

## 2023-07-12 NOTE — Assessment & Plan Note (Signed)
-   He will be placed on as needed DuoNebs and will hold off long-acting beta agonist while ruling out ACS.

## 2023-07-12 NOTE — Assessment & Plan Note (Signed)
-   This is suspicious on the left base. - We will place him on IV Rocephin and Zithromax patient given his history of COPD and productive purulent cough. - Mucolytic therapy and bronchodilator therapy will be provided. - We will follow blood cultures.

## 2023-07-12 NOTE — Assessment & Plan Note (Signed)
-   We will continue his antihypertensive therapy. 

## 2023-07-13 ENCOUNTER — Encounter: Payer: Medicare HMO | Admitting: Family

## 2023-07-13 ENCOUNTER — Inpatient Hospital Stay: Payer: Medicare HMO

## 2023-07-13 ENCOUNTER — Telehealth: Payer: Self-pay

## 2023-07-13 DIAGNOSIS — R079 Chest pain, unspecified: Secondary | ICD-10-CM | POA: Diagnosis not present

## 2023-07-13 LAB — GLUCOSE, CAPILLARY
Glucose-Capillary: 105 mg/dL — ABNORMAL HIGH (ref 70–99)
Glucose-Capillary: 109 mg/dL — ABNORMAL HIGH (ref 70–99)
Glucose-Capillary: 146 mg/dL — ABNORMAL HIGH (ref 70–99)
Glucose-Capillary: 203 mg/dL — ABNORMAL HIGH (ref 70–99)

## 2023-07-13 MED ORDER — FUROSEMIDE 10 MG/ML IJ SOLN
40.0000 mg | Freq: Once | INTRAMUSCULAR | Status: AC
  Administered 2023-07-13: 40 mg via INTRAVENOUS
  Filled 2023-07-13: qty 4

## 2023-07-13 MED ORDER — FUROSEMIDE 10 MG/ML IJ SOLN
40.0000 mg | Freq: Every day | INTRAMUSCULAR | Status: DC
  Administered 2023-07-14: 40 mg via INTRAVENOUS
  Filled 2023-07-13: qty 4

## 2023-07-13 MED ORDER — METHYLPREDNISOLONE SODIUM SUCC 125 MG IJ SOLR
80.0000 mg | INTRAMUSCULAR | Status: DC
  Administered 2023-07-13 – 2023-07-15 (×3): 80 mg via INTRAVENOUS
  Filled 2023-07-13 (×3): qty 2
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
## 2023-07-13 NOTE — Telephone Encounter (Signed)
Copied from CRM [id number]. Topic: General - Other >> Jul 13, 2023  1:52 PM Phill Myron wrote: Lawrence [name] Patient's insurance denied all home care services [facility])

## 2023-07-13 NOTE — Progress Notes (Signed)
PROGRESS NOTE    Lawrence [name]  ZOX:[id number] DOB: [birth year] DOA: 07/11/2023 PCP: Malva Limes, MD    Brief Narrative:  67 y.o., Caucasian male with medical history significant for atrial fibrillation, asthma, coronary artery disease, status post two-vessel CABG, status post PCI and stents, type diabetes mellitus, COPD on home O2 at 2 L/min, he osteoarthritis and anxiety, who presented to the emergency room with acute onset of midsternal chest pain felt as an elephant sitting on his chest with associated nausea and diaphoresis.  He graded it 9/10 in severity and it was radiating to his left lower.  He admitted to recent cough productive of yellowish sputum as well as occasional wheezing and dyspnea.  He was seen in the ER 3 days ago and was given molnupiravir when he tested positive for COVID-19.  He stated that it was significantly weak before that and his weakness has improved on.  No fever or chills.  No loss of taste or smell.  No diarrhea or melena or bright red being pregnant.  No dysuria, oliguria or hematuria or flank pain.   Patient's oxygen status has deteriorated somewhat.  Continues to endorse chest pain and shortness of breath.  Seen in consultation by cardiology.  No plans for ischemic evaluation during hospital admission.   Assessment & Plan:   Principal Problem:   Chest pain, unspecified Active Problems:   Chest pain   Essential hypertension   Community acquired pneumonia   Paroxysmal atrial fibrillation (HCC)   Type 2 diabetes mellitus without complications (HCC)   Chronic obstructive pulmonary disease (COPD) (HCC)   Dyslipidemia   Gout  Atypical chest pain Unclear etiology.  Not suspected to be cardiac in nature.  Seen in consultation by cardiology.  No plans for ischemic evaluation.  COVID infection Shortness of breath Acute hypoxic respiratory failure Initial concern for concomitant bacterial pneumonia.  This has been ruled out.  No fevers, no white count,  negative procalcitonin Suspect that chest pain and shortness of breath are likely driven by COVID infection Currently requiring 4 L nasal cannula Plan: Initiate IV steroids Continue mucolytic's and bronchodilators Incentive spirometry and flutter valve use Lasix 40 mg once daily Reassess daily for diuretic need and tolerance Monitor vitals and fever curve Wean oxygen as tolerated  Sinus bradycardia Hold parameters with home metoprolol  Paroxysmal atrial fibrillation PTA Eliquis and amiodarone  Essential hypertension Blood pressure controlled, continue current regimen  Diabetes mellitus without complication Sliding-scale coverage Have modified diet Continue Farxiga Hold metformin Consider addition of long-acting insulin should sugars rise  Gout Allopurinol  Hyperlipidemia Statin  COPD No evidence of acute exacerbation As needed bronchodilators  DVT prophylaxis: Eliquis Code Status: DNR Family Communication: None Disposition Plan: Status is: Inpatient Remains inpatient appropriate because: Atypical chest pain, shortness of breath, hypoxia in the setting of recent COVID infection   Level of care: Telemetry Cardiac  Consultants:  Cardiology-[facility]  Procedures:  None  Antimicrobials: None   Subjective: Seen and examined.  Continues to endorse chest pain shortness of breath.  Objective: Vitals:   07/12/23 2141 07/13/23 0525 07/13/23 0852 07/13/23 1000  BP: (!) 151/85 (!) 148/87 (!) 156/83   Pulse: (!) 50 (!) 49 (!) 48   Resp: 20 11  14   Temp: 97.6 F (36.4 C) 97.7 F (36.5 C) (!) 97 F (36.1 C)   TempSrc:  Oral Oral   SpO2: 98% 93% 100%   Weight:      Height:  Intake/Output Summary (Last 24 hours) at 07/13/2023 1130 Last data filed at 07/13/2023 1100 Gross per 24 hour  Intake 180 ml  Output 2400 ml  Net -2220 ml   Filed Weights   07/12/23 1228  Weight: 86.2 kg    Examination:  General exam: NAD Respiratory system: Diminished breath  sounds.  Scattered crackles bilaterally.  Normal work of breathing.  4 L Cardiovascular system: S1-S2, bradycardic, regular rhythm, no murmurs, no pedal edema Gastrointestinal system: Soft, NT/ND, normal bowel sounds Central nervous system: Alert and oriented. No focal neurological deficits. Extremities: Symmetric 5 x 5 power. Skin: No rashes, lesions or ulcers Psychiatry: Judgement and insight appear normal. Mood & affect appropriate.     Data Reviewed: I have personally reviewed following labs and imaging studies  CBC: Recent Labs  Lab 07/09/23 1259 07/11/23 2257 07/12/23 0415  WBC 6.4 6.7 5.6  HGB 12.3* 12.6* 11.9*  HCT 40.1 40.1 38.0*  MCV 92.4 91.1 92.0  PLT 163 159 142*   Basic Metabolic Panel: Recent Labs  Lab 07/09/23 1259 07/11/23 2257 07/12/23 0415  NA 142 138 139  K 5.0 4.1 4.2  CL 109 106 107  CO2 25 26 26   GLUCOSE 112* 118* 109*  BUN 16 9 10   CREATININE 0.92 0.76 0.70  CALCIUM 9.3 9.1 8.6*   GFR: Estimated Creatinine Clearance: 91.3 mL/min (by C-G formula based on SCr of 0.7 mg/dL). Liver Function Tests: No results for input(s): "AST", "ALT", "ALKPHOS", "BILITOT", "PROT", "ALBUMIN" in the last 168 hours. No results for input(s): "LIPASE", "AMYLASE" in the last 168 hours. No results for input(s): "AMMONIA" in the last 168 hours. Coagulation Profile: No results for input(s): "INR", "PROTIME" in the last 168 hours. Cardiac Enzymes: No results for input(s): "CKTOTAL", "CKMB", "CKMBINDEX", "TROPONINI" in the last 168 hours. BNP (last 3 results) No results for input(s): "PROBNP" in the last 8760 hours. HbA1C: No results for input(s): "HGBA1C" in the last 72 hours. CBG: Recent Labs  Lab 07/12/23 1403 07/12/23 1645 07/12/23 1741 07/12/23 2142 07/13/23 0849  GLUCAP 104* 112* 126* 133* 105*   Lipid Profile: No results for input(s): "CHOL", "HDL", "LDLCALC", "TRIG", "CHOLHDL", "LDLDIRECT" in the last 72 hours. Thyroid Function Tests: No results for  input(s): "TSH", "T4TOTAL", "FREET4", "T3FREE", "THYROIDAB" in the last 72 hours. Anemia Panel: No results for input(s): "VITAMINB12", "FOLATE", "FERRITIN", "TIBC", "IRON", "RETICCTPCT" in the last 72 hours. Sepsis Labs: Recent Labs  Lab 07/12/23 0157 07/12/23 0338 07/12/23 0415  PROCALCITON  --   --  <0.10  LATICACIDVEN 1.0 0.9  --     Recent Results (from the past 240 hour(s))  Resp panel by RT-PCR (RSV, Flu A&B, Covid) Anterior Nasal Swab     Status: Abnormal   Collection Time: 07/09/23 12:59 PM   Specimen: Anterior Nasal Swab  Result Value Ref Range Status   SARS Coronavirus 2 by RT PCR POSITIVE (A) NEGATIVE Final    Comment: (NOTE) SARS-CoV-2 target nucleic acids are DETECTED.  The SARS-CoV-2 RNA is generally detectable in upper respiratory specimens during the acute phase of infection. Positive results are indicative of the presence of the identified virus, but do not rule out bacterial infection or co-infection with other pathogens not detected by the test. Clinical correlation with patient history and other diagnostic information is necessary to determine patient infection status. The expected result is Negative.  Fact Sheet for Patients: BloggerCourse.com  Fact Sheet for Healthcare Providers: SeriousBroker.it  This test is not yet approved or cleared by the [place]  FDA and  has been authorized for detection and/or diagnosis of SARS-CoV-2 by FDA under an Emergency Use Authorization (EUA).  This EUA will remain in effect (meaning this test can be used) for the duration of  the COVID-19 declaration under Section 564(b)(1) of the A ct, 21 U.S.C. section 360bbb-3(b)(1), unless the authorization is terminated or revoked sooner.     Influenza A by PCR NEGATIVE NEGATIVE Final   Influenza B by PCR NEGATIVE NEGATIVE Final    Comment: (NOTE) The Xpert Xpress SARS-CoV-2/FLU/RSV plus assay is intended as an aid in the  diagnosis of influenza from Nasopharyngeal swab specimens and should not be used as a sole basis for treatment. Nasal washings and aspirates are unacceptable for Xpert Xpress SARS-CoV-2/FLU/RSV testing.  Fact Sheet for Patients: BloggerCourse.com  Fact Sheet for Healthcare Providers: SeriousBroker.it  This test is not yet approved or cleared by the [place] FDA and has been authorized for detection and/or diagnosis of SARS-CoV-2 by FDA under an Emergency Use Authorization (EUA). This EUA will remain in effect (meaning this test can be used) for the duration of the COVID-19 declaration under Section 564(b)(1) of the Act, 21 U.S.C. section 360bbb-3(b)(1), unless the authorization is terminated or revoked.     Resp Syncytial Virus by PCR NEGATIVE NEGATIVE Final    Comment: (NOTE) Fact Sheet for Patients: BloggerCourse.com  Fact Sheet for Healthcare Providers: SeriousBroker.it  This test is not yet approved or cleared by the [place] FDA and has been authorized for detection and/or diagnosis of SARS-CoV-2 by FDA under an Emergency Use Authorization (EUA). This EUA will remain in effect (meaning this test can be used) for the duration of the COVID-19 declaration under Section 564(b)(1) of the Act, 21 U.S.C. section 360bbb-3(b)(1), unless the authorization is terminated or revoked.  Performed at [facility], [address], [place], [place] [address]          Radiology Studies: [facility] Chest [place] 1 View  Result Date: 07/11/2023 CLINICAL DATA:  Left-sided chest pain. Increased shortness of breath. Cough. COVID positive. EXAM: PORTABLE CHEST 1 VIEW COMPARISON:  Radiograph 2 days ago 07/09/2023 FINDINGS: Prior median sternotomy. Stable cardiomegaly post CABG. Chronic peribronchial thickening. Insert increase in patchy airspace disease at the left lung base. No  pneumothorax or large pleural effusion. No acute osseous findings. IMPRESSION: 1. Increasing patchy airspace disease at the left lung base, atelectasis versus pneumonia. 2. Chronic peribronchial thickening. Stable cardiomegaly post CABG. Electronically Signed   By: Narda Rutherford M.D.   On: 07/11/2023 23:26        Scheduled Meds:  allopurinol  300 mg Oral BID   amiodarone  200 mg Oral Daily   apixaban  5 mg Oral BID   vitamin C  1,000 mg Oral Daily   atorvastatin  80 mg Oral Daily   clopidogrel  75 mg Oral Daily   dapagliflozin propanediol  10 mg Oral Daily   famotidine  20 mg Oral BID   furosemide  40 mg Intravenous Once   guaiFENesin  600 mg Oral BID   insulin aspart  0-5 Units Subcutaneous QHS   insulin aspart  0-9 Units Subcutaneous TID WC   Ipratropium-Albuterol  1 puff Inhalation QID   iron polysaccharides  150 mg Oral Daily   isosorbide mononitrate  120 mg Oral Daily   lisinopril  20 mg Oral Daily   loratadine  10 mg Oral Daily   methylPREDNISolone (SOLU-MEDROL) injection  80 mg Intravenous Q24H   metoprolol succinate  12.5  mg Oral Daily   omega-3 acid ethyl esters  1 g Oral Daily   pantoprazole  40 mg Oral BID   ranolazine  1,000 mg Oral BID   traZODone  150 mg Oral QHS   venlafaxine XR  75 mg Oral Q breakfast   zinc sulfate  220 mg Oral Daily   Continuous Infusions:   LOS: 1 day    Tresa Moore, MD [facility]   If 7PM-7AM, please contact night-coverage  07/13/2023, 11:30 AM

## 2023-07-13 NOTE — Progress Notes (Signed)
PHARMACIST - PHYSICIAN COMMUNICATION   CONCERNING: Methylprednisolone IV    Current order: Methylprednisolone IV 40mg  IV q12h     DESCRIPTION: Per [facility] Protocol:   IV methylprednisolone will be converted to either a q12h or q24h frequency with the same total daily dose (TDD).  Ordered Dose: 1 to 125 mg TDD; convert to: TDD q24h.  Ordered Dose: 126 to 250 mg TDD; convert to: TDD div q12h.  Ordered Dose: >250 mg TDD; DAW.  Order has been adjusted to: Methylprednisolone IV 80 mg IV q24h   Tymere Depuy A , PharmD Clinical Pharmacist  07/13/2023 10:07 AM

## 2023-07-13 NOTE — Plan of Care (Signed)
  Problem: Activity: Goal: Ability to tolerate increased activity will improve Outcome: Progressing   Problem: Clinical Measurements: Goal: Ability to maintain a body temperature in the normal range will improve Outcome: Progressing   Problem: Respiratory: Goal: Ability to maintain adequate ventilation will improve Outcome: Progressing Goal: Ability to maintain a clear airway will improve Outcome: Progressing   Problem: Coping: Goal: Ability to adjust to condition or change in health will improve Outcome: Progressing   Problem: Fluid Volume: Goal: Ability to maintain a balanced intake and output will improve Outcome: Progressing   Problem: Activity: Goal: Risk for activity intolerance will decrease Outcome: Progressing   Problem: Metabolic: Goal: Ability to maintain appropriate glucose levels will improve Outcome: Not Progressing   Problem: Skin Integrity: Goal: Risk for impaired skin integrity will decrease Outcome: Not Progressing

## 2023-07-14 ENCOUNTER — Other Ambulatory Visit: Payer: Self-pay | Admitting: Family Medicine

## 2023-07-14 DIAGNOSIS — R079 Chest pain, unspecified: Secondary | ICD-10-CM | POA: Diagnosis not present

## 2023-07-14 DIAGNOSIS — J441 Chronic obstructive pulmonary disease with (acute) exacerbation: Secondary | ICD-10-CM

## 2023-07-14 LAB — BASIC METABOLIC PANEL
Anion gap: 11 (ref 5–15)
BUN: 31 mg/dL — ABNORMAL HIGH (ref 8–23)
CO2: 31 mmol/L (ref 22–32)
Calcium: 8.7 mg/dL — ABNORMAL LOW (ref 8.9–10.3)
Chloride: 91 mmol/L — ABNORMAL LOW (ref 98–111)
Creatinine, Ser: 1.29 mg/dL — ABNORMAL HIGH (ref 0.61–1.24)
GFR, Estimated: >60 mL/min (ref >60)
Glucose, Bld: 151 mg/dL — ABNORMAL HIGH (ref 70–99)
Potassium: 4.1 mmol/L (ref 3.5–5.1)
Sodium: 133 mmol/L — ABNORMAL LOW (ref 135–145)

## 2023-07-14 LAB — GLUCOSE, CAPILLARY
Glucose-Capillary: 150 mg/dL — ABNORMAL HIGH (ref 70–99)
Glucose-Capillary: 161 mg/dL — ABNORMAL HIGH (ref 70–99)
Glucose-Capillary: 168 mg/dL — ABNORMAL HIGH (ref 70–99)
Glucose-Capillary: 235 mg/dL — ABNORMAL HIGH (ref 70–99)

## 2023-07-14 LAB — MAGNESIUM: Magnesium: 1.9 mg/dL (ref 1.7–2.4)

## 2023-07-14 MED ORDER — LACTATED RINGERS IV SOLN: INTRAVENOUS | Status: AC

## 2023-07-14 NOTE — TOC Progression Note (Signed)
Transition of Care [facility]) - Progression Note    Patient Details  Name: Lawrence [name] MRN: [id number] Date of Birth: [birth year]  Transition of Care [facility]) CM/SW Contact  Margarito Liner, LCSW Phone Number: 07/14/2023, 11:01 AM  Clinical Narrative:  [employer] home oxygen orders are for 3 L. Per flowsheet, patient is back down to this liter flow.   Expected Discharge Plan: Home/Self Care Barriers to Discharge: Continued Medical Work up  Expected Discharge Plan and Services     Post Acute Care Choice: NA Living arrangements for the past 2 months: Apartment                                       Social Determinants of Health (SDOH) Interventions SDOH Screenings   Food Insecurity: No Food Insecurity (07/13/2023)  Housing: Low Risk  (05/19/2023)  Transportation Needs: No Transportation Needs (07/13/2023)  Utilities: Not At Risk (07/13/2023)  Alcohol Screen: Low Risk  (03/10/2023)  Depression (PHQ2-9): Medium Risk (06/02/2023)  Financial Resource Strain: Low Risk  (03/10/2023)  Physical Activity: Inactive (03/10/2023)  Social Connections: Moderately Isolated (03/10/2023)  Stress: No Stress Concern Present (03/10/2023)  Tobacco Use: Medium Risk (07/11/2023)    Readmission Risk Interventions    07/13/2023   10:52 AM 05/18/2023   10:56 AM  Readmission Risk Prevention Plan  Transportation Screening Complete Complete  Medication Review (RN Care Manager) Complete Complete  PCP or Specialist appointment within 3-5 days of discharge Complete Complete  HRI or Home Care Consult  Complete  SW Recovery Care/Counseling Consult Complete Complete  Palliative Care Screening Not Applicable Not Applicable  Skilled Nursing Facility Not Applicable Not Applicable

## 2023-07-14 NOTE — Progress Notes (Addendum)
Progress Note    Lawrence [name]  ZOX:[id number] DOB: 1955-12-14  DOA: 07/11/2023 PCP: Malva Limes, MD      Brief Narrative:    Medical records reviewed and are as summarized below:  Lawrence [name] is a 67 y.o. male with medical history significant for atrial fibrillation, asthma, coronary artery disease, status post two-vessel CABG, status post PCI and stents, type II DM, COPD, chronic hypoxic respiratory failure on 2 L/min oxygen, osteoarthritis, anxiety, recent COVID-19 diagnosis (3 days prior to admission, for which he was given molnupiravir).  He presented to the hospital with acute onset of midsternal chest pain, nausea and diaphoresis.  He described the sensation as an elephant sitting on his chest.  He also complained of cough productive of yellowish sputum and wheezing.      Assessment/Plan:   Principal Problem:   Chest pain, unspecified Active Problems:   Chest pain   Essential hypertension   Community acquired pneumonia   Paroxysmal atrial fibrillation (HCC)   Type 2 diabetes mellitus without complications (HCC)   Chronic obstructive pulmonary disease (COPD) (HCC)   Dyslipidemia   Gout    Body mass index is 29.76 kg/m.   Chest pain, mildly elevated but flat troponins: He was seen by the cardiologist.  Chest pain is noncardiac in etiology.  He also has anxiety which could be contributing to chest pain.   Acute on chronic hypoxic respiratory failure: Improving.  Oxygen has been weaned down from 4 L to 3 L/min.  He is on 2 L oxygen at baseline Discontinue IV Lasix.   COPD exacerbation: Continue steroids and bronchodilators.   Paroxysmal atrial fibrillation, bradycardia: Continue amiodarone and Eliquis   Recent COVID-19 infection: Completed course of molnupiravir on 07/13/2023   Other comorbidities include gout, hyperlipidemia, type II DM, CAD   ADDENDUM   BMP done today showed increasing creatinine (from 0.7 to 1.29) consistent with AKI.   IV Lasix was discontinued earlier this morning prior to obtaining BMP.  Discontinue IV ketorolac, lisinopril and dapagliflozin.  Avoid nephrotoxics.  Start Ringer's lactate infusion at 75 mL/h.  Repeat BMP tomorrow.    Diet Order             Diet Carb Modified Fluid consistency: Thin; Room service appropriate? Yes  Diet effective now                            Consultants: Cardiologist  Procedures: None    Medications:    allopurinol  300 mg Oral BID   amiodarone  200 mg Oral Daily   apixaban  5 mg Oral BID   vitamin C  1,000 mg Oral Daily   atorvastatin  80 mg Oral Daily   clopidogrel  75 mg Oral Daily   dapagliflozin propanediol  10 mg Oral Daily   famotidine  20 mg Oral BID   furosemide  40 mg Intravenous Daily   guaiFENesin  600 mg Oral BID   insulin aspart  0-5 Units Subcutaneous QHS   insulin aspart  0-9 Units Subcutaneous TID WC   Ipratropium-Albuterol  1 puff Inhalation QID   iron polysaccharides  150 mg Oral Daily   isosorbide mononitrate  120 mg Oral Daily   lisinopril  20 mg Oral Daily   loratadine  10 mg Oral Daily   methylPREDNISolone (SOLU-MEDROL) injection  80 mg Intravenous Q24H   metoprolol succinate  12.5 mg Oral Daily   omega-3 acid  ethyl esters  1 g Oral Daily   pantoprazole  40 mg Oral BID   ranolazine  1,000 mg Oral BID   traZODone  150 mg Oral QHS   venlafaxine XR  75 mg Oral Q breakfast   zinc sulfate  220 mg Oral Daily   Continuous Infusions:   Anti-infectives (From admission, onward)    Start     Dose/Rate Route Frequency Ordered Stop   07/12/23 2200  molnupiravir EUA (LAGEVRIO) capsule 800 mg  Status:  Discontinued        4 capsule Oral 2 times daily 07/12/23 0419 07/13/23 0921   07/12/23 0345  cefTRIAXone (ROCEPHIN) 2 g in sodium chloride 0.9 % 100 mL IVPB  Status:  Discontinued        2 g 200 mL/hr over 30 Minutes Intravenous Every 24 hours 07/12/23 0335 07/12/23 0759   07/12/23 0345  azithromycin (ZITHROMAX) 500 mg  in sodium chloride 0.9 % 250 mL IVPB  Status:  Discontinued        500 mg 250 mL/hr over 60 Minutes Intravenous Every 24 hours 07/12/23 0335 07/12/23 1228              Family Communication/Anticipated D/C date and plan/Code Status   DVT prophylaxis:  apixaban (ELIQUIS) tablet 5 mg     Code Status: DNR  Family Communication: None Disposition Plan: Plan to discharge home tomorrow   Status is: Inpatient Remains inpatient appropriate because: COPD exacerbation       Subjective:   Interval events noted.  He complains of chest pain   Objective:    Vitals:   07/13/23 2310 07/14/23 0317 07/14/23 0815 07/14/23 1131  BP: 99/65 119/76 130/83 95/60  Pulse: 88  (!) 53 (!) 59  Resp: 16 19 18 18   Temp: 97.8 F (36.6 C) 97.7 F (36.5 C) 97.8 F (36.6 C) 97.8 F (36.6 C)  TempSrc: Oral Axillary Oral   SpO2: 97% 96% 98% 99%  Weight:      Height:       No data found.   Intake/Output Summary (Last 24 hours) at 07/14/2023 1407 Last data filed at 07/14/2023 1405 Gross per 24 hour  Intake 1460 ml  Output 1450 ml  Net 10 ml   Filed Weights   07/12/23 1228  Weight: 86.2 kg    Exam:  GEN: NAD SKIN: Warm and dry EYES: No pallor or icterus ENT: MMM CV: RRR PULM: Bilateral expiratory wheezing.  No rales heard ABD: soft, ND, NT, +BS CNS: AAO x 3, non focal EXT: No edema or tenderness        Data Reviewed:   I have personally reviewed following labs and imaging studies:  Labs: Labs show the following:   Basic Metabolic Panel: Recent Labs  Lab 07/09/23 1259 07/11/23 2257 07/12/23 0415  NA 142 138 139  K 5.0 4.1 4.2  CL 109 106 107  CO2 25 26 26   GLUCOSE 112* 118* 109*  BUN 16 9 10   CREATININE 0.92 0.76 0.70  CALCIUM 9.3 9.1 8.6*   GFR Estimated Creatinine Clearance: 91.3 mL/min (by C-G formula based on SCr of 0.7 mg/dL). Liver Function Tests: No results for input(s): "AST", "ALT", "ALKPHOS", "BILITOT", "PROT", "ALBUMIN" in the last 168  hours. No results for input(s): "LIPASE", "AMYLASE" in the last 168 hours. No results for input(s): "AMMONIA" in the last 168 hours. Coagulation profile No results for input(s): "INR", "PROTIME" in the last 168 hours.  CBC: Recent Labs  Lab 07/09/23 1259  07/11/23 2257 07/12/23 0415  WBC 6.4 6.7 5.6  HGB 12.3* 12.6* 11.9*  HCT 40.1 40.1 38.0*  MCV 92.4 91.1 92.0  PLT 163 159 142*   Cardiac Enzymes: No results for input(s): "CKTOTAL", "CKMB", "CKMBINDEX", "TROPONINI" in the last 168 hours. BNP (last 3 results) No results for input(s): "PROBNP" in the last 8760 hours. CBG: Recent Labs  Lab 07/13/23 1144 07/13/23 1647 07/13/23 2003 07/14/23 0816 07/14/23 1150  GLUCAP 109* 146* 203* 150* 161*   D-Dimer: Recent Labs    07/11/23 2257  DDIMER 1.22*   Hgb A1c: No results for input(s): "HGBA1C" in the last 72 hours. Lipid Profile: No results for input(s): "CHOL", "HDL", "LDLCALC", "TRIG", "CHOLHDL", "LDLDIRECT" in the last 72 hours. Thyroid function studies: No results for input(s): "TSH", "T4TOTAL", "T3FREE", "THYROIDAB" in the last 72 hours.  Invalid input(s): "FREET3" Anemia work up: No results for input(s): "VITAMINB12", "FOLATE", "FERRITIN", "TIBC", "IRON", "RETICCTPCT" in the last 72 hours. Sepsis Labs: Recent Labs  Lab 07/09/23 1259 07/11/23 2257 07/12/23 0157 07/12/23 0338 07/12/23 0415  PROCALCITON  --   --   --   --  <0.10  WBC 6.4 6.7  --   --  5.6  LATICACIDVEN  --   --  1.0 0.9  --     Microbiology Recent Results (from the past 240 hour(s))  Resp panel by RT-PCR (RSV, Flu A&B, Covid) Anterior Nasal Swab     Status: Abnormal   Collection Time: 07/09/23 12:59 PM   Specimen: Anterior Nasal Swab  Result Value Ref Range Status   SARS Coronavirus 2 by RT PCR POSITIVE (A) NEGATIVE Final    Comment: (NOTE) SARS-CoV-2 target nucleic acids are DETECTED.  The SARS-CoV-2 RNA is generally detectable in upper respiratory specimens during the acute phase of  infection. Positive results are indicative of the presence of the identified virus, but do not rule out bacterial infection or co-infection with other pathogens not detected by the test. Clinical correlation with patient history and other diagnostic information is necessary to determine patient infection status. The expected result is Negative.  Fact Sheet for Patients: BloggerCourse.com  Fact Sheet for Healthcare Providers: SeriousBroker.it  This test is not yet approved or cleared by the [place] FDA and  has been authorized for detection and/or diagnosis of SARS-CoV-2 by FDA under an Emergency Use Authorization (EUA).  This EUA will remain in effect (meaning this test can be used) for the duration of  the COVID-19 declaration under Section 564(b)(1) of the A ct, 21 U.S.C. section 360bbb-3(b)(1), unless the authorization is terminated or revoked sooner.     Influenza A by PCR NEGATIVE NEGATIVE Final   Influenza B by PCR NEGATIVE NEGATIVE Final    Comment: (NOTE) The Xpert Xpress SARS-CoV-2/FLU/RSV plus assay is intended as an aid in the diagnosis of influenza from Nasopharyngeal swab specimens and should not be used as a sole basis for treatment. Nasal washings and aspirates are unacceptable for Xpert Xpress SARS-CoV-2/FLU/RSV testing.  Fact Sheet for Patients: BloggerCourse.com  Fact Sheet for Healthcare Providers: SeriousBroker.it  This test is not yet approved or cleared by the [place] FDA and has been authorized for detection and/or diagnosis of SARS-CoV-2 by FDA under an Emergency Use Authorization (EUA). This EUA will remain in effect (meaning this test can be used) for the duration of the COVID-19 declaration under Section 564(b)(1) of the Act, 21 U.S.C. section 360bbb-3(b)(1), unless the authorization is terminated or revoked.     Resp Syncytial Virus by  PCR NEGATIVE NEGATIVE Final    Comment: (NOTE) Fact Sheet for Patients: BloggerCourse.com  Fact Sheet for Healthcare Providers: SeriousBroker.it  This test is not yet approved or cleared by the [place] FDA and has been authorized for detection and/or diagnosis of SARS-CoV-2 by FDA under an Emergency Use Authorization (EUA). This EUA will remain in effect (meaning this test can be used) for the duration of the COVID-19 declaration under Section 564(b)(1) of the Act, 21 U.S.C. section 360bbb-3(b)(1), unless the authorization is terminated or revoked.  Performed at [facility], [address] [place], [place] [address]     Procedures and diagnostic studies:  DG Chest [facility] 1 View  Result Date: 07/13/2023 CLINICAL DATA:  200808 Hypoxia [id number] EXAM: PORTABLE CHEST - 1 VIEW COMPARISON:  07/11/2023 FINDINGS: Patchy airspace opacities at the left lung base previous minimally improved. Heart size and mediastinal contours are within normal limits. Aortic Atherosclerosis (ICD10-170.0). No effusion. Sternotomy wires. IMPRESSION: Minimally improved left basilar airspace disease. Electronically Signed   By: Corlis Leak M.D.   On: 07/13/2023 15:57               LOS: 2 days   Lawrence [name]  [facility]   Pager on [email or web address]. If 7PM-7AM, please contact night-coverage at [email or web address]     07/14/2023, 2:07 PM

## 2023-07-14 NOTE — Telephone Encounter (Signed)
Medication Refill - Medication: oxyCODONE-acetaminophen (PERCOCET) 10-325 MG tablet, methocarbamol (ROBAXIN) 500 MG tablet, omega-3 acid ethyl esters (LOVAZA) capsule 1 g and ALPRAZolam (XANAX) tablet 1 mg   Has the patient contacted their pharmacy? No.   Preferred Pharmacy (with phone number or street name):  [facility] - [place], [place] - [address] Phone: [phone]  Fax: [phone]     Has the patient been seen for an appointment in the last year OR does the patient have an upcoming appointment? Yes.    Agent: Please be advised that RX refills may take up to 3 business days. We ask that you follow-up with your pharmacy.

## 2023-07-15 ENCOUNTER — Telehealth: Payer: Self-pay | Admitting: Family Medicine

## 2023-07-15 ENCOUNTER — Other Ambulatory Visit: Payer: Self-pay | Admitting: Family Medicine

## 2023-07-15 DIAGNOSIS — R079 Chest pain, unspecified: Secondary | ICD-10-CM | POA: Diagnosis not present

## 2023-07-15 DIAGNOSIS — J441 Chronic obstructive pulmonary disease with (acute) exacerbation: Secondary | ICD-10-CM | POA: Diagnosis not present

## 2023-07-15 DIAGNOSIS — N179 Acute kidney failure, unspecified: Secondary | ICD-10-CM | POA: Diagnosis not present

## 2023-07-15 LAB — BASIC METABOLIC PANEL
Anion gap: 3 — ABNORMAL LOW (ref 5–15)
BUN: 31 mg/dL — ABNORMAL HIGH (ref 8–23)
CO2: 35 mmol/L — ABNORMAL HIGH (ref 22–32)
Calcium: 8.8 mg/dL — ABNORMAL LOW (ref 8.9–10.3)
Chloride: 99 mmol/L (ref 98–111)
Creatinine, Ser: 1.1 mg/dL (ref 0.61–1.24)
GFR, Estimated: >60 mL/min (ref >60)
Glucose, Bld: 177 mg/dL — ABNORMAL HIGH (ref 70–99)
Potassium: 4.7 mmol/L (ref 3.5–5.1)
Sodium: 137 mmol/L (ref 135–145)

## 2023-07-15 LAB — GLUCOSE, CAPILLARY
Glucose-Capillary: 158 mg/dL — ABNORMAL HIGH (ref 70–99)
Glucose-Capillary: 194 mg/dL — ABNORMAL HIGH (ref 70–99)

## 2023-07-15 MED ORDER — ALPRAZOLAM 1 MG PO TABS: 1.0000 mg | ORAL_TABLET | Freq: Three times a day (TID) | ORAL | 3 refills | Status: DC | PRN

## 2023-07-15 NOTE — Telephone Encounter (Signed)
Patient requesting refills. Called pharmacy and lovaza already signed 07/09/23. Waiting for remaining medications to be refilled before delivery of medications.

## 2023-07-15 NOTE — TOC Transition Note (Signed)
Transition of Care [facility]) - CM/SW Discharge Note   Patient Details  Name: Lawrence [name] MRN: [id number] Date of Birth: 02/08/1956  Transition of Care [facility] [place] CM/SW Contact:  Margarito Liner, LCSW Phone Number: 07/15/2023, 11:45 AM   Clinical Narrative:  Patient has orders to discharge home today. No further concerns. CSW signing off.   Final next level of care: Home/Self Care Barriers to Discharge: Barriers Resolved   Patient Goals and CMS Choice      Discharge Placement                  Patient to be transferred to facility by: Friend   Patient and family notified of of transfer: 07/15/23  Discharge Plan and Services Additional resources added to the After Visit Summary for       Post Acute Care Choice: NA                               Social Determinants of Health (SDOH) Interventions SDOH Screenings   Food Insecurity: No Food Insecurity (07/13/2023)  Housing: Low Risk  (05/19/2023)  Transportation Needs: No Transportation Needs (07/13/2023)  Utilities: Not At Risk (07/13/2023)  Alcohol Screen: Low Risk  (03/10/2023)  Depression (PHQ2-9): Medium Risk (06/02/2023)  Financial Resource Strain: Low Risk  (03/10/2023)  Physical Activity: Inactive (03/10/2023)  Social Connections: Moderately Isolated (03/10/2023)  Stress: No Stress Concern Present (03/10/2023)  Tobacco Use: Medium Risk (07/11/2023)     Readmission Risk Interventions    07/13/2023   10:52 AM 05/18/2023   10:56 AM  Readmission Risk Prevention Plan  Transportation Screening Complete Complete  Medication Review (RN Care Manager) Complete Complete  PCP or Specialist appointment within 3-5 days of discharge Complete Complete  HRI or Home Care Consult  Complete  SW Recovery Care/Counseling Consult Complete Complete  Palliative Care Screening Not Applicable Not Applicable  Skilled Nursing Facility Not Applicable Not Applicable

## 2023-07-15 NOTE — Telephone Encounter (Signed)
Medication Refill - Medication: oxyCODONE-acetaminophen (PERCOCET) 10-325 MG tablet   Has the patient contacted their pharmacy? Yes.    Preferred Pharmacy (with phone number or street name):  [facility] - [place], [place] - [address] Phone: [phone]  Fax: [phone]     Has the patient been seen for an appointment in the last year OR does the patient have an upcoming appointment? Yes.    Agent: Please be advised that RX refills may take up to 3 business days. We ask that you follow-up with your pharmacy.

## 2023-07-15 NOTE — Telephone Encounter (Signed)
Patient called to f/u on his request to get his Oxycodone. He stated the pharmacy closes at 6pm and he needs it delivered. Please f/u with patient

## 2023-07-15 NOTE — Discharge Summary (Signed)
Physician Discharge Summary   Patient: Lawrence [name] MRN: [id number] DOB: 07-06-1956  Admit date:     07/11/2023  Discharge date: 07/15/23  Discharge Physician: Lurene Shadow   PCP: Malva Limes, MD   Recommendations at discharge:   Follow-up with PCP in 1 week  Discharge Diagnoses: Principal Problem:   Chest pain, unspecified Active Problems:   Chest pain   Essential hypertension   Community acquired pneumonia   Paroxysmal atrial fibrillation (HCC)   Type 2 diabetes mellitus without complications (HCC)   Chronic obstructive pulmonary disease (COPD) (HCC)   Dyslipidemia   Gout   AKI (acute kidney injury) (HCC)  Resolved Problems:   * No resolved hospital problems. *  Hospital Course:  Lawrence [name] is a 67 y.o. male with medical history significant for atrial fibrillation, asthma, coronary artery disease, status post two-vessel CABG, status post PCI and stents, type II DM, COPD, chronic hypoxic respiratory failure on 2 L/min oxygen, osteoarthritis, anxiety, recent COVID-19 diagnosis (3 days prior to admission, for which he was given molnupiravir).  He presented to the hospital with acute onset of midsternal chest pain, nausea and diaphoresis.  He described the sensation as an elephant sitting on his chest.  He also complained of cough productive of yellowish sputum and wheezing.    Assessment and Plan:  Chest pain, mildly elevated but flat troponins: He was seen by the cardiologist.  Chest pain is noncardiac in etiology.  He also has anxiety which could be contributing to chest pain.     Acute on chronic hypoxic respiratory failure: Improved.  He is tolerating room air.  Of note, he uses 2 L/min oxygen at home.  Continue oxygen as needed.    COPD exacerbation: Improved after treatment with steroids and bronchodilators.  Continue bronchodilators discharge     Paroxysmal atrial fibrillation, bradycardia: Continue amiodarone, metoprolol and Eliquis     Recent  COVID-19 infection: Completed course of molnupiravir on 07/13/2023   AKI: Improved   Chronic diastolic CHF, continue torsemide at discharge     Other comorbidities include gout, hyperlipidemia, type II DM, CAD     His condition has improved and he is deemed stable for discharge to home today.        Consultants: Cardiologist Procedures performed: None Disposition: Home Diet recommendation:  Discharge Diet Orders (From admission, onward)     Start     Ordered   07/15/23 0000  Diet - low sodium heart healthy        07/15/23 1133   07/15/23 0000  Diet Carb Modified        07/15/23 1133           Cardiac diet DISCHARGE MEDICATION: Allergies as of 07/15/2023       Reactions   Prednisone Other (See Comments), Hypertension   Pt states that this medication puts him in A-fib    Demerol  [meperidine Hcl]    Demerol [meperidine] Hives   Jardiance [empagliflozin] Other (See Comments)   Perineal pain   Sulfa Antibiotics Hives   Albuterol Sulfate [albuterol] Palpitations, Other (See Comments)   Pt currently uses this medication.     Morphine Sulfate Nausea And Vomiting, Rash, Other (See Comments)   Pt states that he is only allergic to the tablet form of this medication.          Medication List     STOP taking these medications    molnupiravir EUA 200 MG Caps capsule Commonly known as: LAGEVRIO  oxyCODONE 5 MG immediate release tablet Commonly known as: Roxicodone       TAKE these medications    Accu-Chek Guide Control Liqd Use with blood glucose monitor as directed   Accu-Chek Guide test strip Generic drug: glucose blood Use as instructed to check sugar daily for type 2 diabetes.   Accu-Chek Guide w/Device Kit Use to check blood sugars as directed   Accu-Chek Softclix Lancets lancets Use as instructed to check sugar daily for type 2 diabetes.   acetaminophen 325 MG tablet Commonly known as: TYLENOL Take 2 tablets (650 mg total) by mouth every  4 (four) hours as needed for headache or mild pain.   albuterol 108 (90 Base) MCG/ACT inhaler Commonly known as: VENTOLIN HFA INHALE 2 PUFFS BY MOUTH EVERY 6 HOURS AS NEEDED FOR SHORTNESS OF BREATH   allopurinol 300 MG tablet Commonly known as: ZYLOPRIM TAKE 1 TABLET BY MOUTH TWICE A DAY   ALPRAZolam 1 MG tablet Commonly known as: XANAX Take 1 tablet (1 mg total) by mouth 3 (three) times daily as needed.   amiodarone 200 MG tablet Commonly known as: PACERONE Take 1 tablet (200 mg total) by mouth daily.   atorvastatin 80 MG tablet Commonly known as: LIPITOR Take 1 tablet (80 mg total) by mouth daily. Please schedule office visit before any future refill.   Breztri Aerosphere 160-9-4.8 MCG/ACT Aero Generic drug: Budeson-Glycopyrrol-Formoterol Inhale 2 puffs into the lungs 2 (two) times daily.   celecoxib 200 MG capsule Commonly known as: CELEBREX Take 1 capsule (200 mg total) by mouth 2 (two) times daily as needed. Home med.   cetirizine 10 MG tablet Commonly known as: ZYRTEC TAKE 1 TABLET BY MOUTH AT BEDTIME   clopidogrel 75 MG tablet Commonly known as: PLAVIX Take 1 tablet (75 mg total) by mouth daily.   dicyclomine 10 MG capsule Commonly known as: BENTYL Take 1 capsule (10 mg total) by mouth 3 (three) times daily as needed (abdominal pain).   Eliquis 5 MG Tabs tablet Generic drug: apixaban TAKE 1 TABLET BY MOUTH TWICE A DAY   Farxiga 10 MG Tabs tablet Generic drug: dapagliflozin propanediol Take 10 mg by mouth daily.   glipiZIDE 5 MG tablet Commonly known as: GLUCOTROL Take 1 tablet (5 mg total) by mouth daily as needed (sugar over 300).   iron polysaccharides 150 MG capsule Commonly known as: NIFEREX Take 1 capsule (150 mg total) by mouth daily.   isosorbide mononitrate 120 MG 24 hr tablet Commonly known as: IMDUR Take 1 tablet (120 mg total) by mouth daily.   lisinopril 20 MG tablet Commonly known as: ZESTRIL Take 20 mg by mouth daily.   meclizine  25 MG tablet Commonly known as: ANTIVERT TAKE ONE TABLET BY THREE TIMES A DAY AS NEEDED FOR DIZZINESS   metFORMIN 500 MG 24 hr tablet Commonly known as: GLUCOPHAGE-XR TAKE 1 TABLET BY MOUTH TWICE A DAY   methocarbamol 500 MG tablet Commonly known as: ROBAXIN Take 500 mg by mouth every 8 (eight) hours as needed for muscle spasms. Take 1 tablet by mouth every hour as needed for muscle spasms   metoprolol succinate 25 MG 24 hr tablet Commonly known as: TOPROL-XL Take 0.5 tablets (12.5 mg total) by mouth daily.   nitroGLYCERIN 0.4 MG SL tablet Commonly known as: NITROSTAT Place 1 tablet (0.4 mg total) under the tongue every 5 (five) minutes as needed for chest pain.   omega-3 acid ethyl esters 1 g capsule Commonly known as: LOVAZA TAKE 4 CAPSULES (  4 GRAMS TOTAL) BY MOUTHDAILY   ondansetron 4 MG tablet Commonly known as: Zofran Take 1 tablet (4 mg total) by mouth every 8 (eight) hours as needed.   oxyCODONE-acetaminophen 10-325 MG tablet Commonly known as: PERCOCET Take 1 tablet by mouth every 4 (four) hours as needed. for pain   pantoprazole 40 MG tablet Commonly known as: PROTONIX Take 1 tablet (40 mg total) by mouth 2 (two) times daily for 14 days.   ranolazine 1000 MG SR tablet Commonly known as: RANEXA Take 1 tablet (1,000 mg total) by mouth 2 (two) times daily.   torsemide 100 MG tablet Commonly known as: DEMADEX Take 0.5 tablets (50 mg total) by mouth daily.   traZODone 150 MG tablet Commonly known as: DESYREL TAKE 1 TABLET BY MOUTH AT BEDTIME   venlafaxine XR 75 MG 24 hr capsule Commonly known as: EFFEXOR-XR TAKE 1 CAPSULE BY MOUTH DAILY WITH BREAKFAST        Follow-up Information     Callwood, Gerda Diss D, MD. Go in 1 week(s).   Specialties: Cardiology, Internal Medicine Contact information: [address] [place] [address] [phone]                Discharge Exam: Ceasar Mons Weights   07/12/23 1228  Weight: 86.2 kg   GEN:  NAD SKIN: Warm and dry EYES: No pallor or icterus ENT: MMM CV: RRR PULM: No wheezing or rales heard ABD: soft, ND, NT, +BS CNS: AAO x 3, non focal EXT: No edema or tenderness   Condition at discharge: good  The results of significant diagnostics from this hospitalization (including imaging, microbiology, ancillary and laboratory) are listed below for reference.   Imaging Studies: DG Chest Port 1 View  Result Date: 07/13/2023 CLINICAL DATA:  200808 Hypoxia [id number] EXAM: PORTABLE CHEST - 1 VIEW COMPARISON:  07/11/2023 FINDINGS: Patchy airspace opacities at the left lung base previous minimally improved. Heart size and mediastinal contours are within normal limits. Aortic Atherosclerosis (ICD10-170.0). No effusion. Sternotomy wires. IMPRESSION: Minimally improved left basilar airspace disease. Electronically Signed   By: Corlis Leak M.D.   On: 07/13/2023 15:57   DG Chest Port 1 View  Result Date: 07/11/2023 CLINICAL DATA:  Left-sided chest pain. Increased shortness of breath. Cough. COVID positive. EXAM: PORTABLE CHEST 1 VIEW COMPARISON:  Radiograph 2 days ago 07/09/2023 FINDINGS: Prior median sternotomy. Stable cardiomegaly post CABG. Chronic peribronchial thickening. Insert increase in patchy airspace disease at the left lung base. No pneumothorax or large pleural effusion. No acute osseous findings. IMPRESSION: 1. Increasing patchy airspace disease at the left lung base, atelectasis versus pneumonia. 2. Chronic peribronchial thickening. Stable cardiomegaly post CABG. Electronically Signed   By: Narda Rutherford M.D.   On: 07/11/2023 23:26   DG Chest 2 View  Result Date: 07/09/2023 CLINICAL DATA:  Chest pain. EXAM: CHEST - 2 VIEW COMPARISON:  Chest radiograph 06/15/2023, 06/12/2023, 04/27/2023 FINDINGS: Status post median sternotomy. Cardiac silhouette is again at the upper limits of normal size. Mediastinal contours are within limits with mild-to-moderate calcification within the aortic arch.  There is again flattening of the diaphragms and mild-to-moderate hyperinflation. There is again cephalization of the pulmonary vasculature as can be seen with pulmonary venous hypertension. Minimal bilateral interstitial thickening is similar to 06/15/2023. Chronic left-greater-than-right basilar linear scarring is unchanged from multiple prior radiographs. No pleural effusion or pneumothorax. Moderate multilevel degenerative disc changes of the thoracic spine. Cholecystectomy clips. IMPRESSION: 1. No significant interval change. Findings suggestive of pulmonary venous hypertension. No overt pulmonary  edema. 2. Chronic hyperinflation compatible with COPD. Electronically Signed   By: Neita Garnet M.D.   On: 07/09/2023 13:52   VAS [place] ABI WITH/WO TBI  Result Date: 07/05/2023  LOWER EXTREMITY DOPPLER STUDY Patient Name:  Lawrence [name]  Date of Exam:   07/05/2023 Medical Rec #: [id number]        Accession #:    [id number] Date of Birth: [birth year]        Patient Gender: M Patient Age:   50 years Exam Location:  [facility] Procedure:      VAS [place] ABI WITH/WO TBI Referring Phys: --------------------------------------------------------------------------------  Indications: Rest pain. High Risk Factors: Diabetes.  Comparison Study: Screening at PCP suggested abnormal flow left leg Performing Technologist: Salvadore Farber RVT  Examination Guidelines: A complete evaluation includes at minimum, Doppler waveform signals and systolic blood pressure reading at the level of bilateral brachial, anterior tibial, and posterior tibial arteries, when vessel segments are accessible. Bilateral testing is considered an integral part of a complete examination. Photoelectric Plethysmograph (PPG) waveforms and toe systolic pressure readings are included as required and additional duplex testing as needed. Limited examinations for reoccurring indications may be performed as noted.  ABI Findings:  +---------+------------------+-----+---------+--------+ Right    Rt Pressure (mmHg)IndexWaveform Comment  +---------+------------------+-----+---------+--------+ Brachial 167                                      +---------+------------------+-----+---------+--------+ ATA      189               1.13 triphasic         +---------+------------------+-----+---------+--------+ PTA      184               1.10 triphasic         +---------+------------------+-----+---------+--------+ Great Toe137               0.82 Normal            +---------+------------------+-----+---------+--------+ +---------+------------------+-----+---------+-------+ Left     Lt Pressure (mmHg)IndexWaveform Comment +---------+------------------+-----+---------+-------+ Brachial 165                                     +---------+------------------+-----+---------+-------+ ATA      184               1.10 biphasic         +---------+------------------+-----+---------+-------+ PTA      173               1.04 triphasic        +---------+------------------+-----+---------+-------+ Great Toe146               0.87 Normal           +---------+------------------+-----+---------+-------+  Summary: Right: Resting right ankle-brachial index is within normal range. The right toe-brachial index is normal. Left: Resting left ankle-brachial index is within normal range. The left toe-brachial index is normal. *See table(s) above for measurements and observations.  Electronically signed by Levora Dredge MD on 07/05/2023 at 5:47:27 PM.    Final    DG Pelvis 1-2 Views  Result Date: 06/15/2023 CLINICAL DATA:  Fall. EXAM: PELVIS - 1-2 VIEW COMPARISON:  Pelvic radiograph dated 11/02/2005. FINDINGS: No acute fracture or dislocation. Mild osteopenia and mild arthritic changes of the hips. Soft tissues are unremarkable. Vascular calcifications noted.  IMPRESSION: No acute fracture or dislocation. Electronically  Signed   By: Elgie Collard M.D.   On: 06/15/2023 22:55   DG Chest 1 View  Result Date: 06/15/2023 CLINICAL DATA:  Chest pain. EXAM: CHEST  1 VIEW COMPARISON:  Chest radiograph dated 06/12/2023. FINDINGS: Mild cardiomegaly with mild central vascular congestion. No focal consolidation, pleural effusion, or pneumothorax. Atherosclerotic calcification of the aorta. Median sternotomy wires and CABG vascular clips. No acute osseous pathology. IMPRESSION: Mild cardiomegaly with mild central vascular congestion. No focal consolidation. Electronically Signed   By: Elgie Collard M.D.   On: 06/15/2023 22:54   CT Maxillofacial Wo Contrast  Result Date: 06/15/2023 CLINICAL DATA:  Status post fall. EXAM: CT MAXILLOFACIAL WITHOUT CONTRAST TECHNIQUE: Multidetector CT imaging of the maxillofacial structures was performed. Multiplanar CT image reconstructions were also generated. RADIATION DOSE REDUCTION: This exam was performed according to the departmental dose-optimization program which includes automated exposure control, adjustment of the mA and/or kV according to patient size and/or use of iterative reconstruction technique. COMPARISON:  None Available. FINDINGS: Osseous: No fracture or mandibular dislocation. No destructive process. Orbits: Negative. No traumatic or inflammatory finding. Sinuses: Clear. Soft tissues: Negative. Limited intracranial: No significant or unexpected finding. IMPRESSION: No acute facial fracture. Electronically Signed   By: Aram Candela M.D.   On: 06/15/2023 22:46   CT Cervical Spine Wo Contrast  Result Date: 06/15/2023 CLINICAL DATA:  Status post fall. EXAM: CT CERVICAL SPINE WITHOUT CONTRAST TECHNIQUE: Multidetector CT imaging of the cervical spine was performed without intravenous contrast. Multiplanar CT image reconstructions were also generated. RADIATION DOSE REDUCTION: This exam was performed according to the departmental dose-optimization program which includes  automated exposure control, adjustment of the mA and/or kV according to patient size and/or use of iterative reconstruction technique. COMPARISON:  February 26, 2023 FINDINGS: Alignment: There is straightening of the normal cervical spine lordosis. Skull base and vertebrae: No acute fracture. No primary bone lesion or focal pathologic process. Soft tissues and spinal canal: No prevertebral fluid or swelling. No visible canal hematoma. Disc levels:  Mild multilevel endplate sclerosis is seen. There is marked severity narrowing of the anterior atlantoaxial articulation. Mild multilevel intervertebral disc space narrowing is noted throughout the remainder of the cervical spine. Bilateral mild-to-moderate severity multilevel facet joint hypertrophy is noted. Upper chest: Negative. Other: Multiple sternal wires are seen. IMPRESSION: 1. No acute cervical spine fracture. 2. Multilevel degenerative changes, as described above. Electronically Signed   By: Aram Candela M.D.   On: 06/15/2023 22:43   CT Head Wo Contrast  Result Date: 06/15/2023 CLINICAL DATA:  Status post fall. EXAM: CT HEAD WITHOUT CONTRAST TECHNIQUE: Contiguous axial images were obtained from the base of the skull through the vertex without intravenous contrast. RADIATION DOSE REDUCTION: This exam was performed according to the departmental dose-optimization program which includes automated exposure control, adjustment of the mA and/or kV according to patient size and/or use of iterative reconstruction technique. COMPARISON:  None Available. FINDINGS: Brain: There is mild cerebral atrophy with widening of the extra-axial spaces and ventricular dilatation. There are areas of decreased attenuation within the white matter tracts of the supratentorial brain, consistent with microvascular disease changes. Vascular: No hyperdense vessel or unexpected calcification. Skull: Normal. Negative for fracture or focal lesion. Sinuses/Orbits: No acute finding. Other:  None. IMPRESSION: 1. Generalized cerebral atrophy with mild, chronic white matter small vessel ischemic changes. 2. No acute intracranial abnormality. Electronically Signed   By: Aram Candela M.D.   On: 06/15/2023 22:41  Microbiology: Results for orders placed or performed during the hospital encounter of 07/09/23  Resp panel by RT-PCR (RSV, Flu A&B, Covid) Anterior Nasal Swab     Status: Abnormal   Collection Time: 07/09/23 12:59 PM   Specimen: Anterior Nasal Swab  Result Value Ref Range Status   SARS Coronavirus 2 by RT PCR POSITIVE (A) NEGATIVE Final    Comment: (NOTE) SARS-CoV-2 target nucleic acids are DETECTED.  The SARS-CoV-2 RNA is generally detectable in upper respiratory specimens during the acute phase of infection. Positive results are indicative of the presence of the identified virus, but do not rule out bacterial infection or co-infection with other pathogens not detected by the test. Clinical correlation with patient history and other diagnostic information is necessary to determine patient infection status. The expected result is Negative.  Fact Sheet for Patients: BloggerCourse.com  Fact Sheet for Healthcare Providers: SeriousBroker.it  This test is not yet approved or cleared by the [place] FDA and  has been authorized for detection and/or diagnosis of SARS-CoV-2 by FDA under an Emergency Use Authorization (EUA).  This EUA will remain in effect (meaning this test can be used) for the duration of  the COVID-19 declaration under Section 564(b)(1) of the A ct, 21 U.S.C. section 360bbb-3(b)(1), unless the authorization is terminated or revoked sooner.     Influenza A by PCR NEGATIVE NEGATIVE Final   Influenza B by PCR NEGATIVE NEGATIVE Final    Comment: (NOTE) The Xpert Xpress SARS-CoV-2/FLU/RSV plus assay is intended as an aid in the diagnosis of influenza from Nasopharyngeal swab specimens  and should not be used as a sole basis for treatment. Nasal washings and aspirates are unacceptable for Xpert Xpress SARS-CoV-2/FLU/RSV testing.  Fact Sheet for Patients: BloggerCourse.com  Fact Sheet for Healthcare Providers: SeriousBroker.it  This test is not yet approved or cleared by the [place] FDA and has been authorized for detection and/or diagnosis of SARS-CoV-2 by FDA under an Emergency Use Authorization (EUA). This EUA will remain in effect (meaning this test can be used) for the duration of the COVID-19 declaration under Section 564(b)(1) of the Act, 21 U.S.C. section 360bbb-3(b)(1), unless the authorization is terminated or revoked.     Resp Syncytial Virus by PCR NEGATIVE NEGATIVE Final    Comment: (NOTE) Fact Sheet for Patients: BloggerCourse.com  Fact Sheet for Healthcare Providers: SeriousBroker.it  This test is not yet approved or cleared by the [place] FDA and has been authorized for detection and/or diagnosis of SARS-CoV-2 by FDA under an Emergency Use Authorization (EUA). This EUA will remain in effect (meaning this test can be used) for the duration of the COVID-19 declaration under Section 564(b)(1) of the Act, 21 U.S.C. section 360bbb-3(b)(1), unless the authorization is terminated or revoked.  Performed at [facility], [address] [place], [place] [address]    *Note: Due to a large number of results and/or encounters for the requested time period, some results have not been displayed. A complete set of results can be found in Results Review.    Labs: CBC: Recent Labs  Lab 07/09/23 1259 07/11/23 2257 07/12/23 0415  WBC 6.4 6.7 5.6  HGB 12.3* 12.6* 11.9*  HCT 40.1 40.1 38.0*  MCV 92.4 91.1 92.0  PLT 163 159 142*   Basic Metabolic Panel: Recent Labs  Lab 07/09/23 1259 07/11/23 2257 07/12/23 0415 07/14/23 1459  07/15/23 0455  NA 142 138 139 133* 137  K 5.0 4.1 4.2 4.1 4.7  CL 109 106 107 91* 99  CO2 25 26 26 31  35*  GLUCOSE 112* 118* 109* 151* 177*  BUN 16 9 10  31* 31*  CREATININE 0.92 0.76 0.70 1.29* 1.10  CALCIUM 9.3 9.1 8.6* 8.7* 8.8*  MG  --   --   --  1.9  --    Liver Function Tests: No results for input(s): "AST", "ALT", "ALKPHOS", "BILITOT", "PROT", "ALBUMIN" in the last 168 hours. CBG: Recent Labs  Lab 07/14/23 0816 07/14/23 1150 07/14/23 1602 07/14/23 2209 07/15/23 0827  GLUCAP 150* 161* 168* 235* 158*    Discharge time spent: greater than 30 minutes.  Signed: Lurene Shadow, MD [facility] 07/15/2023

## 2023-07-15 NOTE — Plan of Care (Signed)
  Problem: Activity: Goal: Ability to tolerate increased activity will improve Outcome: Adequate for Discharge   Problem: Clinical Measurements: Goal: Ability to maintain a body temperature in the normal range will improve Outcome: Adequate for Discharge   Problem: Respiratory: Goal: Ability to maintain adequate ventilation will improve Outcome: Adequate for Discharge Goal: Ability to maintain a clear airway will improve Outcome: Adequate for Discharge   Problem: Education: Goal: Ability to describe self-care measures that may prevent or decrease complications (Diabetes Survival Skills Education) will improve Outcome: Adequate for Discharge Goal: Individualized Educational Video(s) Outcome: Adequate for Discharge   Problem: Coping: Goal: Ability to adjust to condition or change in health will improve Outcome: Adequate for Discharge   Problem: Fluid Volume: Goal: Ability to maintain a balanced intake and output will improve Outcome: Adequate for Discharge   Problem: Health Behavior/Discharge Planning: Goal: Ability to identify and utilize available resources and services will improve Outcome: Adequate for Discharge Goal: Ability to manage health-related needs will improve Outcome: Adequate for Discharge   Problem: Metabolic: Goal: Ability to maintain appropriate glucose levels will improve Outcome: Adequate for Discharge   Problem: Nutritional: Goal: Maintenance of adequate nutrition will improve Outcome: Adequate for Discharge Goal: Progress toward achieving an optimal weight will improve Outcome: Adequate for Discharge   Problem: Skin Integrity: Goal: Risk for impaired skin integrity will decrease Outcome: Adequate for Discharge   Problem: Tissue Perfusion: Goal: Adequacy of tissue perfusion will improve Outcome: Adequate for Discharge   Problem: Education: Goal: Knowledge of General Education information will improve Description: Including pain rating scale,  medication(s)/side effects and non-pharmacologic comfort measures Outcome: Adequate for Discharge   Problem: Health Behavior/Discharge Planning: Goal: Ability to manage health-related needs will improve Outcome: Adequate for Discharge   Problem: Clinical Measurements: Goal: Ability to maintain clinical measurements within normal limits will improve Outcome: Adequate for Discharge Goal: Will remain free from infection Outcome: Adequate for Discharge Goal: Diagnostic test results will improve Outcome: Adequate for Discharge Goal: Respiratory complications will improve Outcome: Adequate for Discharge Goal: Cardiovascular complication will be avoided Outcome: Adequate for Discharge   Problem: Activity: Goal: Risk for activity intolerance will decrease Outcome: Adequate for Discharge   Problem: Nutrition: Goal: Adequate nutrition will be maintained Outcome: Adequate for Discharge   Problem: Coping: Goal: Level of anxiety will decrease Outcome: Adequate for Discharge   Problem: Elimination: Goal: Will not experience complications related to bowel motility Outcome: Adequate for Discharge Goal: Will not experience complications related to urinary retention Outcome: Adequate for Discharge   Problem: Pain Managment: Goal: General experience of comfort will improve Outcome: Adequate for Discharge   Problem: Safety: Goal: Ability to remain free from injury will improve Outcome: Adequate for Discharge   Problem: Skin Integrity: Goal: Risk for impaired skin integrity will decrease Outcome: Adequate for Discharge   

## 2023-07-15 NOTE — Telephone Encounter (Signed)
Requested medication (s) are due for refill today: yes   Requested medication (s) are on the active medication list: yes   Last refill:  oxycodone 5 mg - 06/18/23 #18 0 refills, robaxin historical medication, xanax- 06/18/23 #30 refills   Future visit scheduled: yes in 1 month  Notes to clinic:  robaxin- historical medication, not delegated per protocol. Oxycodone , xanax last ordered 06/18/23 by Willy Eddy, MD. Do you want to order Rxs?     Requested Prescriptions  Pending Prescriptions Disp Refills   oxyCODONE (ROXICODONE) 5 MG immediate release tablet 18 tablet 0    Sig: Take 1 tablet (5 mg total) by mouth every 8 (eight) hours as needed.     Not Delegated - Analgesics:  Opioid Agonists Failed - 07/14/2023 11:23 AM      Failed - This refill cannot be delegated      Passed - Urine Drug Screen completed in last 360 days      Passed - Valid encounter within last 3 months    Recent Outpatient Visits           1 month ago Coronary artery disease of native artery of native heart with stable angina pectoris (HCC)   [place] [facility] Malva Limes, MD   2 months ago No-show for appointment   [facility] Merita Norton T, FNP   3 months ago Pleuritic chest pain   [place] [facility] Malva Limes, MD   4 months ago Pleuritic chest pain   [place] [facility] Malva Limes, MD   5 months ago Pleuritic chest pain   [place] [facility] Malva Limes, MD       Future Appointments             In 1 month [name], Demetrios Isaacs, MD [name]   In 1 month Vanga, Loel Dubonnet, MD [facility] [place] [facility] at [facility]             methocarbamol (ROBAXIN) 500 MG tablet      Sig: Take 1 tablet (500 mg total) by mouth every 8 (eight) hours as needed for muscle spasms. Take 1 tablet by mouth every hour as needed for muscle spasms      Not Delegated - Analgesics:  Muscle Relaxants Failed - 07/14/2023 11:23 AM      Failed - This refill cannot be delegated      Passed - Valid encounter within last 6 months    Recent Outpatient Visits           1 month ago Coronary artery disease of native artery of native heart with stable angina pectoris [facility])   [place] [facility] Malva Limes, MD   2 months ago No-show for appointment   [facility] T, FNP   3 months ago Pleuritic chest pain   [place] [facility] Malva Limes, MD   4 months ago Pleuritic chest pain   [place] [facility] Malva Limes, MD   5 months ago Pleuritic chest pain    [facility] L[facility], MD       Future Appointments             In 1 month [name], Demetrios Isaacs, MD [facility], [name]   In 1 month Vanga, Loel Dubonnet, MD [facility]  [facility] [place] [facility] at [facility]             ALPRAZolam Prudy Feeler) 1 MG tablet 30 tablet 0    Sig: Take 1 tablet (1 mg total) by mouth 3 (three) times daily as needed.     Not Delegated - Psychiatry: Anxiolytics/Hypnotics 2 Failed - 07/14/2023 11:23 AM      Failed - This refill cannot be delegated      Passed - Urine Drug Screen completed in last 360 days      Passed - Patient is not pregnant      Passed - Valid encounter within last 6 months    Recent Outpatient Visits           1 month ago Coronary artery disease of native artery of native heart with stable angina pectoris (HCC)   [place] [facility] Malva Limes, MD   2 months ago No-show for appointment   [facility] T, FNP   3 months ago Pleuritic chest pain   [place] [facility] Malva Limes, MD   4 months ago Pleuritic chest pain   [place] [facility] Malva Limes, MD   5 months  ago Pleuritic chest pain   [place] [facility] Malva Limes, MD       Future Appointments             In 1 month [name], Demetrios Isaacs, MD [facility], [name]   In 1 month Vanga, Loel Dubonnet, MD [facility] [place] [facility] at [facility] Prescriptions Disp Refills   omega-3 acid ethyl esters (LOVAZA) 1 g capsule 120 capsule 3     Endocrinology:  Nutritional Agents - omega-3 acid ethyl esters Failed - 07/14/2023 11:23 AM      Failed - Lipid Panel in normal range within the last 12 months    Cholesterol, Total  Date Value Ref Range Status  03/08/2023 100 100 - 199 mg/dL Final   Cholesterol  Date Value Ref Range Status  05/23/2014 92 0 - 200 mg/dL Final   Ldl Cholesterol, Calc  Date Value Ref Range Status  05/23/2014 42 0 - 100 mg/dL Final   LDL Chol Calc (NIH)  Date Value Ref Range Status  03/08/2023 35 0 - 99 mg/dL Final   Direct LDL  Date Value Ref Range Status  09/27/2020 122.1 (H) 0 - 99 mg/dL Final    Comment:    Performed at [facility], [address] [place], [place] [address]   HDL Cholesterol  Date Value Ref Range Status  05/23/2014 28 (L) 40 - 60 mg/dL Final   HDL  Date Value Ref Range Status  03/08/2023 51 >39 mg/dL Final   Triglycerides  Date Value Ref Range Status  03/08/2023 65 0 - 149 mg/dL Final  36/64/4034 742 0 - 200 mg/dL Final         Passed - Valid encounter within last 12 months    Recent Outpatient Visits           1 month ago Coronary artery disease of native artery of native heart with stable angina pectoris [facility])   [place] [facility] Malva Limes, MD   2 months ago No-show for appointment   [facility] Merita Norton T, FNP   3 months ago Pleuritic chest  pain   [facility] Malva Limes, MD   4 months ago Pleuritic chest pain   [place] [facility]  Malva Limes, MD   5 months ago Pleuritic chest pain   [place] [facility] Malva Limes, MD       Future Appointments             In 1 month [name], Demetrios Isaacs, MD [facility], [name]   In 1 month Vanga, Loel Dubonnet, MD [facility] [place] [facility] at [facility]

## 2023-07-15 NOTE — Care Management Important Message (Signed)
Important Message  Patient Details  Name: Lawrence [name] MRN: [id number] Date of Birth: 1955/11/18   Medicare Important Message Given:  Yes  Reviewed Medicare IM with patient via room phone [phone]). In agreement with discharge.  Copy of Medicare IM placed in mail via Patient Access staff, no further copy needed at this time.   [name] 07/15/2023, 1:18 PM

## 2023-07-15 NOTE — Telephone Encounter (Signed)
Pt is calling in requesting to speak with one of Dr. Theodis Aguas nurses regarding his Oxycodone. Please follow up with pt.

## 2023-07-15 NOTE — Plan of Care (Signed)
  Problem: Skin Integrity: Goal: Risk for impaired skin integrity will decrease Outcome: Progressing   Problem: Safety: Goal: Ability to remain free from injury will improve Outcome: Progressing   Problem: Pain Managment: Goal: General experience of comfort will improve Outcome: Progressing   Problem: Elimination: Goal: Will not experience complications related to bowel motility Outcome: Progressing   Problem: Coping: Goal: Level of anxiety will decrease Outcome: Progressing   Problem: Clinical Measurements: Goal: Ability to maintain clinical measurements within normal limits will improve Outcome: Progressing

## 2023-07-15 NOTE — Telephone Encounter (Signed)
Called pharmacy  to clarify medication request for lovaza. Pharmacy staff reports lovaza Rx ready for pick up and patient waiting for delivery of medications once all medications refilled.

## 2023-07-15 NOTE — Telephone Encounter (Signed)
Pt is calling in because he has been trying to get his medication oxyCODONE-acetaminophen (PERCOCET) 10-325 MG tablet [[id number]] since earlier today. Pt says it is critical he gets this medication because he is completely out and would need it delivered because he doesn't drive. Pt says pharmacy is able to deliver it if the prescription can be put in prior to the pharmacy closing at 6. Pt wants to know what the issue is regarding this medication and why has it been difficult to obtain. Please follow up with pt.

## 2023-07-16 ENCOUNTER — Telehealth: Payer: Self-pay | Admitting: *Deleted

## 2023-07-16 ENCOUNTER — Encounter: Payer: Self-pay | Admitting: *Deleted

## 2023-07-16 NOTE — Telephone Encounter (Signed)
Duplicate

## 2023-07-16 NOTE — Consult Note (Signed)
   [facility] Inpatient Consult   07/16/2023  Lawrence [name] 10-22-1956 [id number]  Primary Care Provider:    Patient is currently active with Care Management for chronic disease management services.  Patient has been engaged by a  Charity fundraiser.  Our community based plan of care has focused on disease management and community resource support.   Patient will receive a post hospital call and will be evaluated for assessments and disease process education.   Plan: Pt discharged home on 07/15/2023  Of note, Care Management services does not replace or interfere with any services that are needed or arranged by inpatient [facility] care management team.   For additional questions or referrals please contact:  Elliot Cousin, RN, [facility] Liaison [place]   Population Health Office Hours MTWF  8:00 am-6:00 pm [phone] mobile [phone] [Office toll free line] Office Hours are M-F 8:30 - 5 pm [email or web address]

## 2023-07-16 NOTE — Telephone Encounter (Signed)
Requested medication (s) are due for refill today: no, last refilled 07/15/23  Requested medication (s) are on the active medication list: yes  Last refill:  07/15/23  Future visit scheduled: yes  Notes to clinic:  Unable to refill per protocol, cannot delegate. Unable to refuse, medication was refilled 07/15/23      Requested Prescriptions  Pending Prescriptions Disp Refills   oxyCODONE-acetaminophen (PERCOCET) 10-325 MG tablet 12 tablet 0    Sig: Take 1 tablet by mouth every 4 (four) hours as needed. for pain     Not Delegated - Analgesics:  Opioid Agonist Combinations Failed - 07/15/2023 10:55 AM      Failed - This refill cannot be delegated      Passed - Urine Drug Screen completed in last 360 days      Passed - Valid encounter within last 3 months    Recent Outpatient Visits           1 month ago Coronary artery disease of native artery of native heart with stable angina pectoris [facility])   [place] [facility] Mal[facility], [facility]   2 months ago No-show for appointment   [facility] Mer[facility] T, FNP   3 months ago Pleuritic chest pain   [place] [facility] Malva Limes, MD   4 months ago Pleuritic chest pain   [place] [facility] Malva Limes, MD   5 months ago Pleuritic chest pain    [facility] Malva Limes, MD       Future Appointments             In 1 month [name], Demetrios Isaacs, MD [facility], [name]   In 1 month Vanga, Loel Dubonnet, MD [facility] [place] [facility] at [facility]

## 2023-07-16 NOTE — Transitions of Care (Post Inpatient/ED Visit) (Signed)
07/16/2023  Name: Lawrence [name] MRN: [id number] DOB: 1956-09-28  Today's TOC FU Call Status: Today's TOC FU Call Status:: Successful TOC FU Call Completed TOC FU Call Complete Date: 07/16/23 Patient's Name and Date of Birth confirmed.  Transition Care Management Follow-up Telephone Call Date of Discharge: 07/15/23 Discharge Facility: [facility]) Type of Discharge: Inpatient Admission Primary Inpatient Discharge Diagnosis:: non-cardiac chest pain How have you been since you were released from the hospital?: Better ("I am better and doing okay today.  I am weighing myself every day and my weight today was 185 lbs.  I will make arrangements today with my transportation to go to all of these appointments next week?) Any questions or concerns?: No  Items Reviewed: Did you receive and understand the discharge instructions provided?: Yes Medications obtained,verified, and reconciled?: Partial Review Completed (Partial review completed; self-manages medications and denies questions/ concerns around medications today- however he is on an extensive list of medications and could potentially benefit from pharmacy referral-- referral placed today) Reason for Partial Mediation Review: time constraints-- extensive medication list in patient who requires extra time Any new allergies since your discharge?: No Dietary orders reviewed?: Yes Type of Diet Ordered:: Low salt; heart healthy, low carb Do you have support at home?: Yes People in Home: alone Name of Support/Comfort Primary Source: Reports resides alone; independent in self-care activities; supportive neighbor assists as/ if needed/ indicated  Medications Reviewed Today: Medications Reviewed Today     Reviewed by Michaela Corner, RN (Registered Nurse) on 07/16/23 at 1236  Med List Status: <None>   Medication Order Taking? Sig Documenting Provider Last Dose Status Informant  Accu-Chek Softclix Lancets lancets  [id number]  Use as instructed to check sugar daily for type 2 diabetes. Malva Limes, MD  Active Self  acetaminophen (TYLENOL) 325 MG tablet [id number]  Take 2 tablets (650 mg total) by mouth every 4 (four) hours as needed for headache or mild pain. Rolly Salter, MD  Active Self  albuterol (VENTOLIN HFA) 108 (90 Base) MCG/ACT inhaler [id number]  INHALE 2 PUFFS BY MOUTH EVERY 6 HOURS AS NEEDED FOR SHORTNESS OF [name], MD  Active Self  allopurinol (ZYLOPRIM) 300 MG tablet [id number]  TAKE 1 TABLET BY MOUTH TWICE A DAY Malva Limes, MD  Active Self  ALPRAZolam Prudy Feeler) 1 MG tablet [id number]  Take 1 tablet (1 mg total) by mouth 3 (three) times daily as needed. Malva Limes, MD  Active   amiodarone (PACERONE) 200 MG tablet [id number]  Take 1 tablet (200 mg total) by mouth daily. Enedina Finner, MD  Active Self  atorvastatin (LIPITOR) 80 MG tablet [id number]  TAKE 1 TABLET BY MOUTH AT BEDTIME Malva Limes, MD  Active   Blood Glucose Calibration (ACCU-CHEK GUIDE CONTROL) [name] [id number]  Use with blood glucose monitor as directed Malva Limes, MD  Active Self  Blood Glucose Monitoring Suppl (ACCU-CHEK GUIDE) w/Device KIT [id number]  Use to check blood sugars as directed Malva Limes, MD  Active Self  Budeson-Glycopyrrol-Formoterol (BREZTRI AEROSPHERE) 160-9-4.8 MCG/ACT [name] [id number]  Inhale 2 puffs into the lungs 2 (two) times daily. Malva Limes, MD  Active Self  celecoxib (CELEBREX) 200 MG capsule [id number]  Take 1 capsule (200 mg total) by mouth 2 (two) times daily as needed. Home med. [name], MD  Active Self  cetirizine (ZYRTEC) 10 MG tablet [id number]  TAKE 1 TABLET BY MOUTH AT BEDTIME Malva Limes, MD  Active Self  clopidogrel (PLAVIX) 75 MG tablet [id number]  Take 1 tablet (75 mg total) by mouth daily.  Patient not taking: Reported on 06/16/2023   Enedina Finner, MD  Active Self  dicyclomine (BENTYL) 10 MG capsule [id number]  Take 1 capsule (10 mg total) by mouth 3  (three) times daily as needed (abdominal pain). Phineas Semen, MD  Active Self  ELIQUIS 5 MG TABS tablet [id number]  TAKE 1 TABLET BY MOUTH TWICE A DAY Malva Limes, MD  Active Self           Med Note [name] Mar 28, 2023  9:02 AM)    FARXIGA 10 MG TABS tablet [id number]  Take 10 mg by mouth daily. [provider]  Active Self  glipiZIDE (GLUCOTROL) 5 MG tablet [id number]  Take 1 tablet (5 mg total) by mouth daily as needed (sugar over 300). Malva Limes, MD  Active Self  glucose blood (ACCU-CHEK GUIDE) test strip [id number]  Use as instructed to check sugar daily for type 2 diabetes. Malva Limes, MD  Active Self  iron polysaccharides (NIFEREX) 150 MG capsule [id number]  Take 1 capsule (150 mg total) by mouth daily. Enedina Finner, MD  Active Self  isosorbide mononitrate (IMDUR) 120 MG 24 hr tablet [id number]  Take 1 tablet (120 mg total) by mouth daily. Enedina Finner, MD  Active Self  lisinopril (ZESTRIL) 20 MG tablet [id number]  Take 20 mg by mouth daily. [provider]  Active Self  meclizine (ANTIVERT) 25 MG tablet [id number]  TAKE ONE TABLET BY THREE TIMES A DAY AS NEEDED FOR DIZZINESS Malva Limes, MD  Active Self  metFORMIN (GLUCOPHAGE-XR) 500 MG 24 hr tablet [id number]  TAKE 1 TABLET BY MOUTH TWICE A DAY Malva Limes, MD  Active Self  methocarbamol (ROBAXIN) 500 MG tablet [id number]  TAKE 1 TABLET BY MOUTH EVERY 8 HOURS AS NEEDED FOR MUSCLE SPASMS Malva Limes, MD  Active   metoprolol succinate (TOPROL-XL) 25 MG 24 hr tablet [id number]  Take 0.5 tablets (12.5 mg total) by mouth daily. Sunnie Nielsen, DO  Active Self           Med Note Michaela Corner   Fri Jul 16, 2023 12:36 PM) 07/16/23: reports during [facility] call today that he thinks "one of my doctors told me to stop this medicine awhile ago, I don't know why"  Pharmacy referral placed due to patient's need for polypharmacy with need for extensive review outside of scope of TOC call   nitroGLYCERIN (NITROSTAT) 0.4 MG SL tablet [id number]  Place 1 tablet (0.4 mg total) under the tongue every 5 (five) minutes as needed for chest pain. Alfredia Ferguson, PA-C  Active Self  omega-3 acid ethyl esters (LOVAZA) 1 g capsule [id number]  TAKE 4 CAPSULES (4 GRAMS TOTAL) BY Miachel Roux, MD  Active Self  ondansetron (ZOFRAN) 4 MG tablet [id number]  Take 1 tablet (4 mg total) by mouth every 8 (eight) hours as needed. Phineas Semen, MD  Active Self  oxyCODONE-acetaminophen (PERCOCET) 10-325 MG tablet [id number] Yes TAKE 1 TABLET BY MOUTH EVERY 4 HOURS AS NEEDED FOR PAIN Malva Limes, MD Taking Active   pantoprazole (PROTONIX) 40 MG tablet [id number]  Take 1 tablet (40 mg total) by mouth 2 (two) times daily for 14 days. Celso Amy, PA-C  Expired 07/12/23 2359 Self  ranolazine (RANEXA) 1000 MG SR tablet [id number]  Take 1 tablet (1,000 mg total) by mouth 2 (two) times daily. Nelson Chimes,  Tilman Neat, MD  Active Self  torsemide (DEMADEX) 100 MG tablet [id number] Yes Take 0.5 tablets (50 mg total) by mouth daily. Alfredia Ferguson, PA-C Taking Active Self           Med Note Michaela Corner   Fri Jul 16, 2023 12:28 PM) 07/16/23: Reports during [facility] call he is taking this medication as prescribed: patient is unable to review all of medications during [facility] call-- he reports he is not ready to review all today  traZODone (DESYREL) 150 MG tablet [id number]  TAKE 1 TABLET BY MOUTH AT BEDTIME Malva Limes, MD  Active Self  venlafaxine XR (EFFEXOR-XR) 75 MG 24 hr capsule [id number]  TAKE 1 CAPSULE BY MOUTH DAILY WITH BREAKFAST Malva Limes, MD  Active Self           Home Care and Equipment/Supplies: Were Home Health Services Ordered?: No Any new equipment or medical supplies ordered?: No  Functional Questionnaire: Do you need assistance with bathing/showering or dressing?: No Do you need assistance with meal preparation?: No Do you need assistance with eating?: No Do you have difficulty  maintaining continence: No Do you need assistance with getting out of bed/getting out of a chair/moving?: No Do you have difficulty managing or taking your medications?: No (reports independently manages medications, but appears very exasperated/ confused during TOC call attenmpt to review medications: pharmacy referral placed)  Follow up appointments reviewed: PCP Follow-up appointment confirmed?: Yes (care coordination outreach in real-time with scheduling care guide to successfully schedule hospital follow up PCP appointment 07/22/23) Date of PCP follow-up appointment?: 07/21/23 Follow-up Provider: PCP- covering provider/ APP Specialist Hospital Follow-up appointment confirmed?: Yes Date of Specialist follow-up appointment?: 07/20/23 Follow-Up Specialty Provider:: CHF clinic; cardiology provider visit scheduled 07/22/23 Do you need transportation to your follow-up appointment?: No Do you understand care options if your condition(s) worsen?: Yes-patient verbalized understanding  SDOH Interventions Today    Flowsheet Row Most Recent Value  SDOH Interventions   Food Insecurity Interventions Intervention Not Indicated  Transportation Interventions Intervention Not Indicated  [reports uses medicaid transportation for provider appointments]      TOC Interventions Today    Flowsheet Row Most Recent Value  TOC Interventions   TOC Interventions Discussed/Reviewed TOC Interventions Discussed, Arranged PCP follow up within 7 days/Care Guide scheduled      Interventions Today    Flowsheet Row Most Recent Value  Chronic Disease   Chronic disease during today's visit Other, Chronic Obstructive Pulmonary Disease (COPD), Diabetes  [non-cardiac chest pain]  General Interventions   General Interventions Discussed/Reviewed General Interventions Discussed, Durable Medical Equipment (DME), Doctor Visits, Referral to Nurse, Communication with  [scheduled with RN CM Care Coordinator for follow up  telephone visit on 07/27/23]  Doctor Visits Discussed/Reviewed Doctor Visits Discussed, PCP, Specialist  [Patient required extensive explanation/ repetition/ reinforcement around his upcoming scheduled appointments]  Durable Medical Equipment (DME) Oxygen, Walker, Glucomoter  [confirmed currently requiring/ using assistive devices - walker/ rollator]  PCP/Specialist Visits Compliance with follow-up visit  Communication with RN  Education Interventions   Education Provided Provided Education  [reinforced rationale for daily weight monitoring at home along with weight gain guidelines/ action plan for weight gain,  importance of taking diuretic as prescribed,  reviewed recent weights at home: reports "185 lbs" today, "lower than I normally am"]  Provided Verbal Education On Medication, Other  [Significant time spent ensuring understanding of upcoming appointments/ need to contact Medicaid transport services to schedule ride,  attempted medication review, but is outside scope  of TOC call due to patient's need for extensive review- pharm referral]  Nutrition Interventions   Nutrition Discussed/Reviewed Nutrition Discussed  Pharmacy Interventions   Pharmacy Dicussed/Reviewed Pharmacy Topics Discussed  Safety Interventions   Safety Discussed/Reviewed Safety Discussed, Fall Risk      Caryl Pina, RN, BSN, CCRN Alumnus RN CM Care Coordination/ Transition of Care- [facility] Care Management [phone]: direct office

## 2023-07-16 NOTE — Telephone Encounter (Signed)
oxyCODONE-acetaminophen (PERCOCET) 10-325 MG tablet 180 tablet 0 07/15/2023 --  Sig - Route: TAKE 1 TABLET BY MOUTH EVERY 4 HOURS AS NEEDED FOR PAIN - Oral  Sent to pharmacy as: oxyCODONE-acetaminophen (PERCOCET) 10-325 MG tablet  Earliest Fill Date: 07/15/2023  E-Prescribing Status: Receipt confirmed by pharmacy (07/15/2023  4:52 PM EDT)

## 2023-07-20 ENCOUNTER — Ambulatory Visit: Payer: Medicare HMO | Attending: Family | Admitting: Family

## 2023-07-20 ENCOUNTER — Encounter: Payer: Self-pay | Admitting: Family

## 2023-07-20 VITALS — BP 156/78 | HR 56 | Wt 194.0 lb

## 2023-07-20 DIAGNOSIS — G4733 Obstructive sleep apnea (adult) (pediatric): Secondary | ICD-10-CM | POA: Insufficient documentation

## 2023-07-20 DIAGNOSIS — E1121 Type 2 diabetes mellitus with diabetic nephropathy: Secondary | ICD-10-CM | POA: Diagnosis not present

## 2023-07-20 DIAGNOSIS — Z8616 Personal history of COVID-19: Secondary | ICD-10-CM | POA: Insufficient documentation

## 2023-07-20 DIAGNOSIS — Z7902 Long term (current) use of antithrombotics/antiplatelets: Secondary | ICD-10-CM | POA: Insufficient documentation

## 2023-07-20 DIAGNOSIS — Z8249 Family history of ischemic heart disease and other diseases of the circulatory system: Secondary | ICD-10-CM | POA: Insufficient documentation

## 2023-07-20 DIAGNOSIS — Z87891 Personal history of nicotine dependence: Secondary | ICD-10-CM | POA: Diagnosis not present

## 2023-07-20 DIAGNOSIS — I130 Hypertensive heart and chronic kidney disease with heart failure and stage 1 through stage 4 chronic kidney disease, or unspecified chronic kidney disease: Secondary | ICD-10-CM | POA: Insufficient documentation

## 2023-07-20 DIAGNOSIS — J4489 Other specified chronic obstructive pulmonary disease: Secondary | ICD-10-CM | POA: Diagnosis not present

## 2023-07-20 DIAGNOSIS — I25119 Atherosclerotic heart disease of native coronary artery with unspecified angina pectoris: Secondary | ICD-10-CM | POA: Insufficient documentation

## 2023-07-20 DIAGNOSIS — Z833 Family history of diabetes mellitus: Secondary | ICD-10-CM | POA: Diagnosis not present

## 2023-07-20 DIAGNOSIS — N189 Chronic kidney disease, unspecified: Secondary | ICD-10-CM | POA: Insufficient documentation

## 2023-07-20 DIAGNOSIS — I252 Old myocardial infarction: Secondary | ICD-10-CM | POA: Diagnosis not present

## 2023-07-20 DIAGNOSIS — Z7901 Long term (current) use of anticoagulants: Secondary | ICD-10-CM | POA: Insufficient documentation

## 2023-07-20 DIAGNOSIS — I480 Paroxysmal atrial fibrillation: Secondary | ICD-10-CM | POA: Diagnosis not present

## 2023-07-20 DIAGNOSIS — E1122 Type 2 diabetes mellitus with diabetic chronic kidney disease: Secondary | ICD-10-CM | POA: Insufficient documentation

## 2023-07-20 DIAGNOSIS — Z951 Presence of aortocoronary bypass graft: Secondary | ICD-10-CM | POA: Diagnosis not present

## 2023-07-20 DIAGNOSIS — Z955 Presence of coronary angioplasty implant and graft: Secondary | ICD-10-CM | POA: Insufficient documentation

## 2023-07-20 DIAGNOSIS — Z79899 Other long term (current) drug therapy: Secondary | ICD-10-CM | POA: Diagnosis not present

## 2023-07-20 DIAGNOSIS — I10 Essential (primary) hypertension: Secondary | ICD-10-CM

## 2023-07-20 DIAGNOSIS — I5032 Chronic diastolic (congestive) heart failure: Secondary | ICD-10-CM | POA: Diagnosis not present

## 2023-07-20 DIAGNOSIS — E785 Hyperlipidemia, unspecified: Secondary | ICD-10-CM | POA: Diagnosis not present

## 2023-07-20 DIAGNOSIS — I25118 Atherosclerotic heart disease of native coronary artery with other forms of angina pectoris: Secondary | ICD-10-CM

## 2023-07-20 NOTE — Progress Notes (Signed)
Advanced Heart Failure Clinic Note    PCP: Malva Limes, MD (last seen 07/24) Cardiologist: Dorothyann Peng, MD (last seen 07/24)  HPI:  Lawrence [name] is a 67 y/o male with a history of paroxysmal atrial fibrillation, coronary artery disease s/p CABG X2 with LIMA to the LAD and SVG to OM (2002); procedure was complicated by coronary artery dissection, asthma, osteoarthritis, hyperlipidemia, GERD, anxiety, DDD, diverticulosis, COPD, CKD essential hypertension and PSVT, type II diabetes mellitus, OSA & chronic heart failure. S/p PCI/DES to mid LAD with subsequent PTCA (2016), proximal to mid LAD stents (2014), distal LAD stent (unknown date), stent in the ostium SVG (unknown date) & circumflex/OM 1 bifucation stent (2013).   Has had 11 caths since 2014  Was in the ED 05/14/23 due to 20 episodes of diarrhea. Abdominal CT negative and negative for e coli. Antibiotics provided. Admitted 05/16/23 due to acute onset of midsternal and left-sided chest pain felt as tightness and graded 9/10 in severity with no radiation however with associated nausea as well as dyspnea and palpitations. Per Dr. Darrold Junker, likely that one of the patient's bypass grafts has occluded with his recent NSTEMI (per his review of films, unlikely to be amenable to an intervention. IV diuresed. Has had 7 ED visits July 2024, 2 ED visits Aug 2024. Most recently was admitted 07/11/23 due to acute onset of midsternal chest pain, nausea and diaphoresis. He described the sensation as an elephant sitting on his chest. He also complained of cough productive of yellowish sputum and wheezing. Was recently treated for covid for which he was given molnupiravir & completed 07/13/23. Chest pain, mildly elevated but flat troponins: He was seen by the cardiologist. Chest pain thought to be noncardiac in etiology. Cardiology consult done.    Echo 06/26/15 EF 60-65% along with mild LVH and trivial AR.  Echo 04/19/18: EF 50-55% Echo 03/08/20: EF 45-50%  along with mild Lawrence Echo 11/02/22: EF 50-55% with mild LVH, moderate LAE Echo 05/01/23: EF 50-55% with mild LVH and mild/ moderate LAE  LHC 11/02/22:   Prox LAD to Mid LAD lesion is 30% stenosed.   Mid LAD lesion is 40% stenosed.   Prox RCA lesion is 15% stenosed.   Dist RCA lesion is 25% stenosed.   1st Mrg lesion is 70% stenosed.   RPAV-2 lesion is 60% stenosed.   RPAV-1 lesion is 85% stenosed with 85% stenosed side branch in 1st RPL.   RPDA-1 lesion is 90% stenosed.   RPDA-2 lesion is 85% stenosed.   Non-stenotic Mid LAD to Dist LAD lesion was previously treated.   Non-stenotic Prox Cx to Mid Cx lesion was previously treated.   Non-stenotic Origin to Prox Graft lesion was previously treated.   LIMA and is small.   There is mild left ventricular systolic dysfunction.   LV end diastolic pressure is mildly elevated.   The left ventricular ejection fraction is 45-50% by visual estimate.  Plan LHC from right  groin because of his persistent anginal symptoms.  Chronic angina Left ventriculogram shows mildly dilated LV with apical hypokinesis EF around 45-50 Coronaries Left main mild irregularities LAD mild to moderate disease.  Diagonal 1 Circumflex large minor irregularities occluded OM1 RCA large minor irregularities moderate disease TIMI-3 flow right dominant No significant collaterals  Grafts LIMA to mid LAD atretic SVG to OM1 widely patent  Patient cath essentially improved from previous procedure done 04/2021.  No areas of significant obstructive disease requiring intervention. Recommend aggressive medical therapy for chronic  chest pain appears to mostly be noncardiac  He presents today for a HF f/u visit with a chief complaint of moderate fatigue with minimal exertion. Chronic in nature although feels like it has worsened since recently being treated for covid. Has associated minimal SOB, dizziness, palpitations and stable chest pain along with this. Denies cough, abdominal  distention, pedal edema or difficulty sleeping. In fact, he feels like he sleeps "all the time" and last night slept from 9pm until noon today. Reports having decreased appetite but feels like he's hydrating well.   Unable to tolerate CPAP due to feeling like he was being choked.   ROS: All systems negative except as listed in HPI, PMH and Problem List.  SH:  Social History   Socioeconomic History   Marital status: Widowed    Spouse name: Not on file   Number of children: 1   Years of education: Not on file   Highest education level: 10th grade  Occupational History   Occupation: Disabled   Occupation: on Tree surgeon  Tobacco Use   Smoking status: Former    Current packs/day: 0.00    Average packs/day: 3.0 packs/day for 50.0 years (150.0 ttl pk-yrs)    Types: Cigarettes    Start date: 04/23/1963    Quit date: 04/22/2013    Years since quitting: 10.2   Smokeless tobacco: Never   Tobacco comments:    Reports not smoking for approx 8 years.  Vaping Use   Vaping status: Never Used  Substance and Sexual Activity   Alcohol use: No    Comment: remotely quit alcohol use. Hx of heavy alcohol use.   Drug use: Not Currently    Types: Marijuana   Sexual activity: Not Currently  Other Topics Concern   Not on file  Social History Narrative   Pt lives in [place] with wife.  Does not routinely exercise.   Social Determinants of Health   Financial Resource Strain: Low Risk  (03/10/2023)   Overall Financial Resource Strain (CARDIA)    Difficulty of Paying Living Expenses: Not hard at all  Food Insecurity: No Food Insecurity (07/16/2023)   Hunger Vital Sign    Worried About Running Out of Food in the Last Year: Never true    Ran Out of Food in the Last Year: Never true  Transportation Needs: No Transportation Needs (07/16/2023)   PRAPARE - Administrator, Civil Service (Medical): No    Lack of Transportation (Non-Medical): No  Physical Activity: Inactive (03/10/2023)    Exercise Vital Sign    Days of Exercise per Week: 0 days    Minutes of Exercise per Session: 0 min  Stress: No Stress Concern Present (03/10/2023)   [employer] of Occupational Health - Occupational Stress Questionnaire    Feeling of Stress : Not at all  Social Connections: Moderately Isolated (03/10/2023)   Social Connection and Isolation Panel [NHANES]    Frequency of Communication with Friends and Family: More than three times a week    Frequency of Social Gatherings with Friends and Family: More than three times a week    Attends Religious Services: More than 4 times per year    Active Member of [employer] or Organizations: No    Attends Banker Meetings: Not on file    Marital Status: Widowed  Intimate Partner Violence: Not At Risk (07/13/2023)   Humiliation, Afraid, Rape, and Kick questionnaire    Fear of Current or Ex-Partner: No    Emotionally Abused:  No    Physically Abused: No    Sexually Abused: No    FH:  Family History  Problem Relation Age of Onset   Heart attack Mother    Depression Mother    Heart disease Mother    COPD Mother    Hypertension Mother    Heart attack Father    Diabetes Father    Depression Father    Heart disease Father    Cirrhosis Father    Parkinson's disease Brother     Past Medical History:  Diagnosis Date   A-fib (HCC)    Anemia    Anginal pain (HCC)    Anxiety    Arthritis    Asthma    CAD (coronary artery disease)    a. 2002 CABGx2 (LIMA->LAD, VG->VG->OM1);  b. 09/2012 DES->OM;  c. 03/2015 PTCA of LAD [facility]) in setting of atretic LIMA; d. 05/2015 Cath [facility]): nonobs dzs; e. 06/2015 Cath ([facility]): LM nl, LAD 45p/d ISR, 50d, D1/2 small, LCX 50p/d ISR, OM1 70ost, 30 ISR, VG->OM1 50ost, 27m, LIMA->LAD 99p/d - atretic, RCA dom, nl; f.cath 10/16: 40-50%(FFR 0.90) pLAD, 75% (FFR 0.77) mLAD s/p PCI/DES, oRCA 40% (FFR0.95)   Cancer (HCC)    SKIN CANCER ON BACK   Celiac disease    Chronic diastolic CHF (congestive heart failure)  (HCC)    a. 06/2009 Echo: EF 60-65%, Gr 1 DD, triv AI, mildly dil LA, nl RV.   COPD (chronic obstructive pulmonary disease) (HCC)    a. Chronic bronchitis and emphysema.   DDD (degenerative disc disease), lumbar    Diverticulosis    Dysrhythmia    Essential hypertension    GERD (gastroesophageal reflux disease)    History of hiatal hernia    History of kidney stones    H/O   History of tobacco abuse    a. Quit 2014.   Myocardial infarction [facility]) 2002   4 STENTS   Pancreatitis    PSVT (paroxysmal supraventricular tachycardia)    a. 10/2012 Noted on Zio Patch.   Sleep apnea    LOST CORD TO CPAP -ONLY 02 @ BEDTIME   Tubular adenoma of colon    Type II diabetes mellitus (HCC)    Vitals:   07/20/23 1446  BP: (!) 156/78  Pulse: (!) 56  SpO2: 90%  Weight: 194 lb (88 kg)   Wt Readings from Last 3 Encounters:  07/20/23 194 lb (88 kg)  07/12/23 190 lb (86.2 kg)  07/05/23 195 lb (88.5 kg)   Lab Results  Component Value Date   CREATININE 1.10 07/15/2023   CREATININE 1.29 (H) 07/14/2023   CREATININE 0.70 07/12/2023    PHYSICAL EXAM:  General:  Well appearing. No resp difficulty HEENT: normal Neck: supple. JVP flat.  No lymphadenopathy or thryomegaly appreciated. Cor: PMI normal. Irregular rhythm, bradycardic. No rubs, gallops or murmurs. Lungs: clear Abdomen: soft, nontender, nondistended. No hepatosplenomegaly. No bruits or masses.  Extremities: no cyanosis, clubbing, rash, edema Neuro: alert & orientedx3, cranial nerves grossly intact. Moves all 4 extremities w/o difficulty. Affect pleasant.   ECG: not done   ASSESSMENT & PLAN:  1: Chronic ischemic heart failure with preserved ejection fraction- - CABG in addition to PCI/DES to mid LAD with subsequent PTCA (2016), proximal to mid LAD stents (2014), distal LAD stent (unknown date), stent in the ostium SVG (unknown date) & circumflex/OM 1 bifucation stent (2013).  - NYHA class III - euvolemic - weighing daily;  reminded to call for an overnight weight gain of >  2 pounds or a weekly weight gain of >5 pounds - weight stable from last visit here 6 weeks ago - Echo 06/26/15 EF 60-65% along with mild LVH and trivial AR.  - Echo 04/19/18: EF 50-55% - Echo 03/08/20: EF 45-50% along with mild Lawrence - Echo 11/02/22: EF 50-55% with mild LVH, moderate LAE - Echo 05/01/23: EF 50-55% with mild LVH and mild/ moderate LAE - continue lisinopril 20mg  daily; consider transition to entresto - continue metoprolol succinate 12.5mg  daily - continue torsemide 50mg  daily - jardiance caused perineal pain - discussed the importance of rest and adequate hydration to aid in recovering from his recent covid infection - BNP 06/12/23 was 498.0  2: HTN- - BP 156/78 - saw PCP Sherrie Mustache) 07/24 - BMP 07/15/23 showed sodium 137, potassium 4.7, creatinine 1.1 & GFR >60  3: CAD- - saw cardiology Juliann Pares) 07/24; has upcoming f/u appt - continue atorvastatin 80mg  daily - continue plavix 75mg  daily - continue isosorbide [place] 120mg  daily - continue ranexa 1000mg  BID - LHC 11/02/22:   Prox LAD to Mid LAD lesion is 30% stenosed.   Mid LAD lesion is 40% stenosed.   Prox RCA lesion is 15% stenosed.   Dist RCA lesion is 25% stenosed.   1st Mrg lesion is 70% stenosed.   RPAV-2 lesion is 60% stenosed.   RPAV-1 lesion is 85% stenosed with 85% stenosed side branch in 1st RPL.   RPDA-1 lesion is 90% stenosed.   RPDA-2 lesion is 85% stenosed.   Non-stenotic Mid LAD to Dist LAD lesion was previously treated.   Non-stenotic Prox Cx to Mid Cx lesion was previously treated.   Non-stenotic Origin to Prox Graft lesion was previously treated.   LIMA and is small.   There is mild left ventricular systolic dysfunction.   LV end diastolic pressure is mildly elevated.   The left ventricular ejection fraction is 45-50% by visual estimate.   4: Atrial fibrillation- - continue amiodarone 200mg  daily - continue eliquis 5mg  BID - irregular rhythm  today  5: DM- - A1c 03/08/23 was 6.6%   Return in 3 months, sooner if needed.

## 2023-07-20 NOTE — Progress Notes (Deleted)
.  h 

## 2023-07-21 ENCOUNTER — Other Ambulatory Visit: Payer: Self-pay

## 2023-07-21 ENCOUNTER — Other Ambulatory Visit: Payer: Self-pay | Admitting: Pharmacist

## 2023-07-21 ENCOUNTER — Encounter: Payer: Self-pay | Admitting: *Deleted

## 2023-07-21 ENCOUNTER — Other Ambulatory Visit: Payer: Medicare HMO

## 2023-07-21 ENCOUNTER — Emergency Department
Admission: EM | Admit: 2023-07-21 | Discharge: 2023-07-21 | Disposition: A | Discharge status: Home / Self Care | Payer: Medicare HMO | Attending: Emergency Medicine | Admitting: Emergency Medicine

## 2023-07-21 ENCOUNTER — Encounter: Payer: Self-pay | Admitting: Family Medicine

## 2023-07-21 ENCOUNTER — Emergency Department: Payer: Medicare HMO

## 2023-07-21 ENCOUNTER — Ambulatory Visit (INDEPENDENT_AMBULATORY_CARE_PROVIDER_SITE_OTHER): Payer: Medicare HMO | Admitting: Family Medicine

## 2023-07-21 VITALS — BP 156/79 | HR 47 | Temp 97.5°F | Resp 16 | Ht 67.0 in | Wt 193.7 lb

## 2023-07-21 DIAGNOSIS — J209 Acute bronchitis, unspecified: Secondary | ICD-10-CM | POA: Insufficient documentation

## 2023-07-21 DIAGNOSIS — Z9981 Dependence on supplemental oxygen: Secondary | ICD-10-CM | POA: Diagnosis not present

## 2023-07-21 DIAGNOSIS — Z7984 Long term (current) use of oral hypoglycemic drugs: Secondary | ICD-10-CM

## 2023-07-21 DIAGNOSIS — I700 Atherosclerosis of aorta: Secondary | ICD-10-CM | POA: Diagnosis not present

## 2023-07-21 DIAGNOSIS — Z23 Encounter for immunization: Secondary | ICD-10-CM | POA: Diagnosis not present

## 2023-07-21 DIAGNOSIS — J441 Chronic obstructive pulmonary disease with (acute) exacerbation: Secondary | ICD-10-CM | POA: Insufficient documentation

## 2023-07-21 DIAGNOSIS — R079 Chest pain, unspecified: Secondary | ICD-10-CM

## 2023-07-21 DIAGNOSIS — J984 Other disorders of lung: Secondary | ICD-10-CM | POA: Diagnosis not present

## 2023-07-21 DIAGNOSIS — I2510 Atherosclerotic heart disease of native coronary artery without angina pectoris: Secondary | ICD-10-CM | POA: Diagnosis not present

## 2023-07-21 DIAGNOSIS — E119 Type 2 diabetes mellitus without complications: Secondary | ICD-10-CM | POA: Insufficient documentation

## 2023-07-21 DIAGNOSIS — R918 Other nonspecific abnormal finding of lung field: Secondary | ICD-10-CM | POA: Diagnosis not present

## 2023-07-21 DIAGNOSIS — I110 Hypertensive heart disease with heart failure: Secondary | ICD-10-CM | POA: Diagnosis not present

## 2023-07-21 DIAGNOSIS — Z09 Encounter for follow-up examination after completed treatment for conditions other than malignant neoplasm: Secondary | ICD-10-CM | POA: Insufficient documentation

## 2023-07-21 DIAGNOSIS — R5383 Other fatigue: Secondary | ICD-10-CM | POA: Diagnosis not present

## 2023-07-21 DIAGNOSIS — E1159 Type 2 diabetes mellitus with other circulatory complications: Secondary | ICD-10-CM

## 2023-07-21 DIAGNOSIS — J440 Chronic obstructive pulmonary disease with acute lower respiratory infection: Secondary | ICD-10-CM

## 2023-07-21 DIAGNOSIS — I509 Heart failure, unspecified: Secondary | ICD-10-CM | POA: Insufficient documentation

## 2023-07-21 DIAGNOSIS — R627 Adult failure to thrive: Secondary | ICD-10-CM | POA: Diagnosis not present

## 2023-07-21 DIAGNOSIS — R0602 Shortness of breath: Secondary | ICD-10-CM | POA: Diagnosis not present

## 2023-07-21 DIAGNOSIS — I5043 Acute on chronic combined systolic (congestive) and diastolic (congestive) heart failure: Secondary | ICD-10-CM | POA: Diagnosis not present

## 2023-07-21 DIAGNOSIS — R531 Weakness: Secondary | ICD-10-CM | POA: Diagnosis not present

## 2023-07-21 DIAGNOSIS — R0789 Other chest pain: Secondary | ICD-10-CM | POA: Diagnosis not present

## 2023-07-21 DIAGNOSIS — R110 Nausea: Secondary | ICD-10-CM | POA: Diagnosis not present

## 2023-07-21 LAB — CBC
HCT: 46 % (ref 39.0–52.0)
Hemoglobin: 14.3 g/dL (ref 13.0–17.0)
MCH: 28.9 pg (ref 26.0–34.0)
MCHC: 31.1 g/dL (ref 30.0–36.0)
MCV: 92.9 fL (ref 80.0–100.0)
Platelets: 161 10*3/uL (ref 150–400)
RBC: 4.95 MIL/uL (ref 4.22–5.81)
RDW: 17 % — ABNORMAL HIGH (ref 11.5–15.5)
WBC: 5.5 10*3/uL (ref 4.0–10.5)
nRBC: 0 % (ref 0.0–0.2)

## 2023-07-21 LAB — BASIC METABOLIC PANEL
Anion gap: 7 (ref 5–15)
BUN: 18 mg/dL (ref 8–23)
CO2: 32 mmol/L (ref 22–32)
Calcium: 9.3 mg/dL (ref 8.9–10.3)
Chloride: 100 mmol/L (ref 98–111)
Creatinine, Ser: 0.9 mg/dL (ref 0.61–1.24)
GFR, Estimated: >60 mL/min (ref >60)
Glucose, Bld: 114 mg/dL — ABNORMAL HIGH (ref 70–99)
Potassium: 3.9 mmol/L (ref 3.5–5.1)
Sodium: 139 mmol/L (ref 135–145)

## 2023-07-21 LAB — TROPONIN I (HIGH SENSITIVITY)
Troponin I (High Sensitivity): 19 ng/L — ABNORMAL HIGH (ref <18)
Troponin I (High Sensitivity): 22 ng/L — ABNORMAL HIGH (ref <18)

## 2023-07-21 MED ORDER — CLOPIDOGREL BISULFATE 75 MG PO TABS: 75.0000 mg | ORAL_TABLET | Freq: Every day | ORAL | 0 refills | Status: DC

## 2023-07-21 MED ORDER — IPRATROPIUM-ALBUTEROL 0.5-2.5 (3) MG/3ML IN SOLN
3.0000 mL | Freq: Once | RESPIRATORY_TRACT | Status: AC
  Administered 2023-07-21: 3 mL via RESPIRATORY_TRACT
  Filled 2023-07-21: qty 3

## 2023-07-21 MED ORDER — METHYLPREDNISOLONE SODIUM SUCC 125 MG IJ SOLR
125.0000 mg | Freq: Once | INTRAMUSCULAR | Status: AC
  Administered 2023-07-21: 125 mg via INTRAVENOUS
  Filled 2023-07-21: qty 2

## 2023-07-21 MED ORDER — DOXYCYCLINE HYCLATE 100 MG PO CAPS: 100.0000 mg | ORAL_CAPSULE | Freq: Two times a day (BID) | ORAL | 0 refills | Status: AC

## 2023-07-21 MED ORDER — PREDNISONE 20 MG PO TABS: 60.0000 mg | ORAL_TABLET | Freq: Every day | ORAL | 0 refills | Status: AC

## 2023-07-21 MED ORDER — APIXABAN 5 MG PO TABS: 5.0000 mg | ORAL_TABLET | Freq: Two times a day (BID) | ORAL | 0 refills | Status: DC

## 2023-07-21 MED ORDER — ISOSORBIDE MONONITRATE ER 120 MG PO TB24: 120.0000 mg | ORAL_TABLET | Freq: Every day | ORAL | 0 refills | Status: DC

## 2023-07-21 MED ORDER — OXYCODONE-ACETAMINOPHEN 5-325 MG PO TABS
1.0000 | ORAL_TABLET | Freq: Once | ORAL | Status: AC
  Administered 2023-07-21: 1 via ORAL
  Filled 2023-07-21: qty 1

## 2023-07-21 MED ORDER — METFORMIN HCL ER 500 MG PO TB24: 500.0000 mg | ORAL_TABLET | Freq: Two times a day (BID) | ORAL | 0 refills | Status: DC

## 2023-07-21 MED ORDER — METOPROLOL SUCCINATE ER 25 MG PO TB24: 12.5000 mg | ORAL_TABLET | Freq: Every day | ORAL | 0 refills | Status: DC

## 2023-07-21 NOTE — Patient Instructions (Signed)
It was a pleasure talking with you today!  As we discussed, I encourage you to speak to your pharmacy about the possibility of medication packaging services they offer. Another good option is using a pill box/organizer to fill weekly.   If you have any questions or concerns regarding your medications, feel free to reach out.  Arbutus Leas, PharmD, BCPS [facility] [phone]

## 2023-07-21 NOTE — Telephone Encounter (Signed)
Contacted patient to review medication list and discussed adherence. See note 07/21/23.

## 2023-07-21 NOTE — Assessment & Plan Note (Addendum)
Continues to have chronic chest pain given acute on chronic heart failure and demand ischemia with known troponin leak.   Followed by heart failure and cardiology.

## 2023-07-21 NOTE — Patient Outreach (Addendum)
  Care Coordination   Collaboration  Visit Note   07/21/2023 Name: Lawrence [name] MRN: [id number] DOB: 24-May-1956  Lawrence [name] is a 67 y.o. year old male who sees [name], Demetrios Isaacs, MD for primary care. Collaboration with provider and RN regarding patient care needs.  What matters to the patients health and wellness today?  Level of Care concerns, possible SNF placement. Patient currently in the ED being evaluated    Goals Addressed             This Visit's Progress    Care Coordination Activities       Interventions Today    Flowsheet Row Most Recent Value  Chronic Disease   Chronic disease during today's visit Chronic Obstructive Pulmonary Disease (COPD)  General Interventions   General Interventions Discussed/Reviewed Communication with, Level of Care  Communication with PCP/Specialists, RN  [PCP concerned that patient needs a higher level of care. Did not know the date/month- presented to appt  without O2 today Patient to be referred to para-medicine-per RN, patient agreeable to SNF-[facility]2 to be completed for placement purposes]  Level of Care Skilled Nursing Facility  [CSW to contact patient to discuss level of care needs and any additional communty resource needs]              SDOH assessments and interventions completed:  No     Care Coordination Interventions:  Yes, provided   Follow up plan:  Follow up to be scheduled post discharge from the hospital    Encounter Outcome:  Patient Visit Completed

## 2023-07-21 NOTE — Patient Outreach (Signed)
Care Management   Visit Note  07/21/2023 Name: Lawrence [name] MRN: [id number] DOB: 1955-12-23  Subjective: Lawrence [name] is a 67 y.o. year old male who is a primary care patient of [name], Demetrios Isaacs, MD. The Care Management team was consulted for assistance.      Engaged with patient spoke with patient by telephone.   Assessment:  Outpatient Encounter Medications as of 07/21/2023  Medication Sig   Accu-Chek Softclix Lancets lancets Use as instructed to check sugar daily for type 2 diabetes.   allopurinol (ZYLOPRIM) 300 MG tablet TAKE 1 TABLET BY MOUTH TWICE A DAY (Patient not taking: Reported on 08/23/2023)   ALPRAZolam (XANAX) 1 MG tablet Take 1 tablet (1 mg total) by mouth 3 (three) times daily as needed. (Patient taking differently: Take 1 mg by mouth 3 (three) times daily.)   atorvastatin (LIPITOR) 80 MG tablet TAKE 1 TABLET BY MOUTH AT BEDTIME   Blood Glucose Calibration (ACCU-CHEK GUIDE CONTROL) LIQD Use with blood glucose monitor as directed   Blood Glucose Monitoring Suppl (ACCU-CHEK GUIDE) w/Device KIT Use to check blood sugars as directed   Budeson-Glycopyrrol-Formoterol (BREZTRI AEROSPHERE) 160-9-4.8 MCG/ACT AERO Inhale 2 puffs into the lungs 2 (two) times daily.   cetirizine (ZYRTEC) 10 MG tablet TAKE 1 TABLET BY MOUTH AT BEDTIME (Patient not taking: Reported on 08/23/2023)   dicyclomine (BENTYL) 10 MG capsule Take 1 capsule (10 mg total) by mouth 3 (three) times daily as needed (abdominal pain). (Patient not taking: Reported on 08/23/2023)   FARXIGA 10 MG TABS tablet Take 10 mg by mouth daily.   glipiZIDE (GLUCOTROL) 5 MG tablet Take 1 tablet (5 mg total) by mouth daily as needed (sugar over 300). (Patient not taking: Reported on 08/23/2023)   glucose blood (ACCU-CHEK GUIDE) test strip Use as instructed to check sugar daily for type 2 diabetes.   iron polysaccharides (NIFEREX) 150 MG capsule Take 1 capsule (150 mg total) by mouth daily.   metFORMIN (GLUCOPHAGE-XR) 500 MG 24 hr  tablet Take 1 tablet (500 mg total) by mouth 2 (two) times daily. (Patient taking differently: Take 500 mg by mouth daily.)   methocarbamol (ROBAXIN) 500 MG tablet TAKE 1 TABLET BY MOUTH EVERY 8 HOURS AS NEEDED FOR MUSCLE SPASMS   omega-3 acid ethyl esters (LOVAZA) 1 g capsule TAKE 4 CAPSULES (4 GRAMS TOTAL) BY MOUTHDAILY   pantoprazole (PROTONIX) 40 MG tablet Take 1 tablet (40 mg total) by mouth 2 (two) times daily for 14 days.   traZODone (DESYREL) 150 MG tablet TAKE 1 TABLET BY MOUTH AT BEDTIME (Patient taking differently: Take 100 mg by mouth at bedtime.)   [DISCONTINUED] acetaminophen (TYLENOL) 325 MG tablet Take 2 tablets (650 mg total) by mouth every 4 (four) hours as needed for headache or mild pain.   [DISCONTINUED] albuterol (VENTOLIN HFA) 108 (90 Base) MCG/ACT inhaler INHALE 2 PUFFS BY MOUTH EVERY 6 HOURS AS NEEDED FOR SHORTNESS OF BREATH (Patient not taking: Reported on 08/05/2023)   [DISCONTINUED] amiodarone (PACERONE) 200 MG tablet Take 1 tablet (200 mg total) by mouth daily.   [DISCONTINUED] apixaban (ELIQUIS) 5 MG TABS tablet Take 1 tablet (5 mg total) by mouth 2 (two) times daily.   [DISCONTINUED] celecoxib (CELEBREX) 200 MG capsule Take 1 capsule (200 mg total) by mouth 2 (two) times daily as needed. Home med.   [DISCONTINUED] clopidogrel (PLAVIX) 75 MG tablet Take 1 tablet (75 mg total) by mouth daily.   [DISCONTINUED] isosorbide mononitrate (IMDUR) 120 MG 24 hr tablet Take 1 tablet (  120 mg total) by mouth daily.   [DISCONTINUED] lisinopril (ZESTRIL) 20 MG tablet Take 20 mg by mouth daily.   [DISCONTINUED] meclizine (ANTIVERT) 25 MG tablet TAKE ONE TABLET BY THREE TIMES A DAY AS NEEDED FOR DIZZINESS   [DISCONTINUED] metoprolol succinate (TOPROL-XL) 25 MG 24 hr tablet Take 0.5 tablets (12.5 mg total) by mouth daily.   [DISCONTINUED] nitroGLYCERIN (NITROSTAT) 0.4 MG SL tablet Place 1 tablet (0.4 mg total) under the tongue every 5 (five) minutes as needed for chest pain.    [DISCONTINUED] ondansetron (ZOFRAN) 4 MG tablet Take 1 tablet (4 mg total) by mouth every 8 (eight) hours as needed. (Patient not taking: Reported on 08/05/2023)   [DISCONTINUED] oxyCODONE-acetaminophen (PERCOCET) 10-325 MG tablet TAKE 1 TABLET BY MOUTH EVERY 4 HOURS AS NEEDED FOR PAIN   [DISCONTINUED] ranolazine (RANEXA) 1000 MG SR tablet Take 1 tablet (1,000 mg total) by mouth 2 (two) times daily. (Patient not taking: Reported on 08/05/2023)   [DISCONTINUED] torsemide (DEMADEX) 100 MG tablet Take 0.5 tablets (50 mg total) by mouth daily.   [DISCONTINUED] venlafaxine XR (EFFEXOR-XR) 75 MG 24 hr capsule TAKE 1 CAPSULE BY MOUTH DAILY WITH BREAKFAST (Patient not taking: Reported on 08/05/2023)   No facility-administered encounter medications on file as of 07/21/2023.    Interventions:   Goals Addressed             This Visit's Progress    Care Management       Current Barriers:  Care Management support and education needs related to Multiple Chronic Diseases Requires additional Care Giver support in the home   COPD Interventions:   Reviewed plan for COPD management.  Patient was evaluated for recently admitted and evaluated for chest pain, shortness of breath and nausea.  Reviewed recent discharge instructions. Reports receiving call from [facility] He is agreeable to  home health services. Reviewed medications. Reports taking all medications as prescribed. Reports being very "winded" today with minimal activity. Reports using supplemental oxygen at 2L/min as advised.  Discussed ability to complete tasks in the home. Reports his activity tolerance has significantly declined. Patient currently lives alone but reports his son is available to assist as needed. Plan is for his grandson to relocate from [facility] to move in and serve as a caregiver. Agreed to keep the care team updated of in home needs. Provided information regarding infection prevention and increased risk r/t COPD. Advised to utilize  prevention strategies to reduce risk of respiratory infection . Thorough discussion regarding worsening symptoms that require immediate medical attention. Patient is agreeable to calling 911 or having his son transport him to the nearest Emergency Room if his symptoms worsen.  Self-Care Activities/Patient Goals: Take medications and utilize inhalers as prescribed Continue using supplemental oxygen as instructed. Follow recommendations to prevent respiratory infection Follow activity restrictions to prevent overexertion and exacerbation Assess symptoms. Immediately call 911 or return to ED if symptoms return or worsen.  -------------------------------------------------------------------------------------------------------------------------  Heart Failure Interventions:   Discussed current plan for CHF management.  Patient was recently evaluated for non specific chest pain. Reports multiple Emergency Room visits. Notes symptoms/chest pain is only controlled with pain meds via IV or injection. Reports symptoms occur suddenly and without specific triggers.  Reports feeling ok at time of call but has been very "winded" since returning home and has experienced episodes of nausea. Thorough discussion regarding need to call 911 or return to the ER if his symptoms worsen. Reviewed established weight parameters. Reminded of need to notify a provider for weight  gain greater than 3 lbs overnight or weight gain greater than 5 lbs within a week. Reviewed s/sx of fluid overload and indications for notifying a provider. Reports significant decline in activity tolerance. Reports his son is currently assisting. Plan is for his grandson to relocate and serve as his caregiver.  Reviewed nutritional intake. Advised to continue monitoring sodium consumption and avoiding highly processed foods when possible. Reports his son is currently assisting with meal preparation. Thorough discussion regarding worsening s/sx related  to CHF exacerbation and indications for seeking immediate medical attention.    Self-Care Activities/Patient Goals: Take all medications as prescribed Keep scheduled appointments with the Cardiology team Monitor weight and record readings Notify provider for weight gain outside of established parameters Limit sodium intake Monitor symptoms and seek immediate medical attention if symptoms worsen -----------------------------------------------------------------------------------------------------------------  Diabetes Reviewed treatment plan for diabetes management. A1C is currently within goal at 6.1%. Reviewed medications. Reports taking medications as prescribed.  Reviewed blood sugar readings. Reports fasting readings have been within range of 100s to 120s. Reports reading was 120 today. Reviewed instructions to administer glipizide for blood sugar readings over 300. Reports having to use it twice for postprandial readings over the past few weeks.  Advised to continue monitoring blood glucose and maintain a log.  Reviewed s/sx of hyperglycemia and hypoglycemia along with appropriate interventions. Denies recent hypoglycemic episodes. Notes two elevated postprandial readings. Administered glipizide as ordered. Denies symptoms associated with elevated readings.  Reviewed nutritional intake. Reports he is attempting to adhere to a diabetic/modified carb diet. Currently does not require nutritional supplements. Reports his son is assisting with meal preparation.  Reviewed preventive health exams. Advised to keep Optometry appointments as scheduled and complete daily foot care. Advised to notify provider with changes.  Advised to complete labs as ordered.   Self-Care Activities/Patient Goals: Take all medications as prescribed Continue monitoring blood glucose levels and maintain a log. Check feet daily for cuts, sores or redness Wash and dry feet carefully every day Wear comfortable, cotton  socks Wear comfortable, well-fitting shoes Read food labels for fat, fiber, carbohydrates and portion size. Avoid foods with added sugar when possible. Complete labs as ordered Call provider office for new concerns or questions              PLAN:  Will follow up within the next week.  [facility]  [facility], [facility] RN Care Manager Direct Dial: [phone] Website: [email or web address]

## 2023-07-21 NOTE — ED Provider Notes (Signed)
[facility] Provider Note    Event Date/Time   First MD Initiated Contact with Patient 07/21/23 1759     (approximate)   History   Chief Complaint Shortness of Breath and Chest Pain   HPI  Lawrence [name] is a 67 y.o. male with past medical history of hypertension, diabetes, CAD, CHF, atrial fibrillation on Eliquis, and COPD on 2 L who presents to the ED complaining of shortness of breath.  Patient reports that he has had increasing difficulty breathing with cough productive of greenish sputum since yesterday evening.  He describes symptoms as similar to prior COPD exacerbations, but does state he developed pain in his chest about an hour prior to arrival.  He describes this as feeling "like someone is sitting on my chest."  He has not noticed any pain or swelling in his legs.     Physical Exam   Triage Vital Signs: ED Triage Vitals  Encounter Vitals Group     BP 07/21/23 1628 (!) 150/73     Systolic BP Percentile --      Diastolic BP Percentile --      Pulse Rate 07/21/23 1628 (!) 57     Resp 07/21/23 1628 20     Temp 07/21/23 1628 98.2 F (36.8 C)     Temp src --      SpO2 07/21/23 1628 96 %     Weight 07/21/23 1629 190 lb (86.2 kg)     Height 07/21/23 1629 5\' 7"  (1.702 m)     Head Circumference --      Peak Flow --      Pain Score 07/21/23 1629 9     Pain Loc --      Pain Education --      Exclude from Growth Chart --     Most recent vital signs: Vitals:   07/21/23 1628 07/21/23 1815  BP: (!) 150/73 (!) 160/83  Pulse: (!) 57 (!) 51  Resp: 20 18  Temp: 98.2 F (36.8 C)   SpO2: 96% 99%    Constitutional: Alert and oriented. Eyes: Conjunctivae are normal. Head: Atraumatic. Nose: No congestion/rhinnorhea. Mouth/Throat: Mucous membranes are moist.  Cardiovascular: Normal rate, regular rhythm. Grossly normal heart sounds.  2+ radial pulses bilaterally. Respiratory: Normal respiratory effort.  No retractions. Lungs with expiratory  wheezing throughout. Gastrointestinal: Soft and nontender. No distention. Musculoskeletal: No lower extremity tenderness nor edema.  Neurologic:  Normal speech and language. No gross focal neurologic deficits are appreciated.    ED Results / Procedures / Treatments   Labs (all labs ordered are listed, but only abnormal results are displayed) Labs Reviewed  BASIC METABOLIC PANEL - Abnormal; Notable for the following components:      Result Value   Glucose, Bld 114 (*)    All other components within normal limits  CBC - Abnormal; Notable for the following components:   RDW 17.0 (*)    All other components within normal limits  TROPONIN I (HIGH SENSITIVITY) - Abnormal; Notable for the following components:   Troponin I (High Sensitivity) 19 (*)    All other components within normal limits  TROPONIN I (HIGH SENSITIVITY) - Abnormal; Notable for the following components:   Troponin I (High Sensitivity) 22 (*)    All other components within normal limits     EKG  ED ECG REPORT I, [name], the attending physician, personally viewed and interpreted this ECG.   Date: 07/21/2023  EKG Time: 16:32  Rate:  54  Rhythm: sinus bradycardia  Axis: Normal  Intervals:none  ST&T Change: None  RADIOLOGY Chest x-ray reviewed and interpreted by me with no focal infiltrate, edema, or effusion.  PROCEDURES:  Critical Care performed: No  Procedures   MEDICATIONS ORDERED IN ED: Medications  ipratropium-albuterol (DUONEB) 0.5-2.5 (3) MG/3ML nebulizer solution 3 mL (has no administration in time range)  methylPREDNISolone sodium succinate (SOLU-MEDROL) 125 mg/2 mL injection 125 mg (125 mg Intravenous Given 07/21/23 1844)  ipratropium-albuterol (DUONEB) 0.5-2.5 (3) MG/3ML nebulizer solution 3 mL (3 mLs Nebulization Given 07/21/23 1842)  oxyCODONE-acetaminophen (PERCOCET/ROXICET) 5-325 MG per tablet 1 tablet (1 tablet Oral Given 07/21/23 1841)     IMPRESSION / MDM / ASSESSMENT AND PLAN /  ED COURSE  I reviewed the triage vital signs and the nursing notes.                              67 y.o. male with past medical history of hypertension, diabetes, CAD, CHF, atrial fibrillation on Eliquis, and COPD on 2 L who presents to the ED with productive cough with increasing difficulty breathing since last night, developed pressure in his chest 1 hour prior to arrival.  Patient's presentation is most consistent with acute presentation with potential threat to life or bodily function.  Differential diagnosis includes, but is not limited to, COPD exacerbation, pneumonia, bronchitis, ACS, PE, pneumothorax, musculoskeletal pain, GERD, anxiety.  Patient nontoxic-appearing and in no acute distress, vital signs are unremarkable.  He is currently breathing comfortably on his usual 2 L nasal cannula, does have expiratory wheezing consistent with COPD exacerbation.  We will treat with IV Solu-Medrol and DuoNeb, chest x-ray shows likely atelectasis but given his purulent sputum production we will plan to start him on antibiotics.  EKG shows no evidence of arrhythmia or ischemia, troponin mildly elevated but similar to prior baseline, we will check second set given acute onset of chest pain.  Low suspicion for PE at this time, additional labs without significant anemia, leukocytosis, tract abnormality, or AKI.  Repeat troponin within normal limits, low suspicion for ACS at this time.  Patient did have recent admission for nonspecific chest pain with her evaluation by cardiology at that time, thought to have noncardiac chest pain and do not feel further workup indicated at this time.  Wheezing improved on reassessment but still present.  He was given second DuoNeb with resolution of wheezing, breathing comfortably on his usual 2 L here in the ED.  Patient appropriate for outpatient management, will prescribe course of steroids and antibiotics given his purulent sputum production.  He was counseled to follow-up  with his PCP and to return to the ED for new or worsening symptoms, patient agrees with plan.      FINAL CLINICAL IMPRESSION(S) / ED DIAGNOSES   Final diagnoses:  COPD exacerbation (HCC)  Chest pain, unspecified type     Rx / DC Orders   ED Discharge Orders          Ordered    predniSONE (DELTASONE) 20 MG tablet  Daily with breakfast        07/21/23 1938    doxycycline (VIBRAMYCIN) 100 MG capsule  2 times daily        07/21/23 1938             Note:  This document was prepared using Dragon voice recognition software and may include unintentional dictation errors.   [name], MD 07/21/23 [phone]

## 2023-07-21 NOTE — Progress Notes (Signed)
07/21/2023 Name: Lawrence [name] MRN: [id number] DOB: 02-03-1956  Chief Complaint  Patient presents with   Medication review    Lawrence [name] is a 67 y.o. year old male who presented for a telephone visit.   They were referred to the pharmacist by their Case Management Team  for assistance in managing  medication review/polypharmacy .    Subjective:  Care Team: Primary Care Provider: Malva Limes, MD ; Next Scheduled Visit: 09/03/2023 Cardiologist: Mirian Capuchin; Next Scheduled Visit: 07/22/23  Medication Access/Adherence  Current Pharmacy:  [facility] - [place], [place] - [address] [place] [address] Phone: [phone] Fax: [phone]    Patient reports affordability concerns with their medications: No  Patient reports access/transportation concerns to their pharmacy: No  Patient reports adherence concerns with their medications:  No   Patient reports no missed doses, does note having trouble keeping medications organized. Refill history shows frequent late refills across al medications. Notes he is trying to get a pill box to have his son fill weekly. Also discussed possibility of getting medication packaging through his pharmacy.   Diabetes:  Current medications: metformin, Farxiga, glipizide PRN   Current glucose readings: 130 this morning after eating, lowest has been 89   Patient denies hypoglycemic s/sx including dizziness, shakiness, sweating.   Heart Failure:  Current medications:  ACEi/ARB/ARNI: lisinopril 40 mg  SGLT2i: Farxiga 10 mg     Beta blocker: metoprolol succinate 25 mg 1/2 tablet daily Mineralocorticoid Receptor Antagonist: none Diuretic regimen: torsemide 100 mg 1/2 tablet daily  Patient weighs daily.  Patient denies volume overload signs or symptoms including shortness of breath, lower extremity edema, increased use of pillows at night   Current medication access support:  Gets his medications from [employer]. They have a charge account for him where he can pay for the previous month's medication every month. He notes he has no issues affording or getting his medications.    Objective:  Lab Results  Component Value Date   HGBA1C 6.6 (H) 03/08/2023    Lab Results  Component Value Date   CREATININE 1.10 07/15/2023   BUN 31 (H) 07/15/2023   NA 137 07/15/2023   K 4.7 07/15/2023   CL 99 07/15/2023   CO2 35 (H) 07/15/2023    Lab Results  Component Value Date   CHOL 100 03/08/2023   HDL 51 03/08/2023   LDLCALC 35 03/08/2023   LDLDIRECT 122.1 (H) 09/27/2020   TRIG 65 03/08/2023   CHOLHDL 2.0 03/08/2023    Medications Reviewed Today     Reviewed by Bonita Quin, RPH (Pharmacist) on 07/21/23 at 1521  Med List Status: <None>   Medication Order Taking? Sig Documenting Provider Last Dose Status Informant  Accu-Chek Softclix Lancets lancets [id number]  Use as instructed to check sugar daily for type 2 diabetes. Malva Limes, MD  Active Self  acetaminophen (TYLENOL) 325 MG tablet [id number]  Take 2 tablets (650 mg total) by mouth every 4 (four) hours as needed for headache or mild pain. Rolly Salter, MD  Active Self  albuterol (VENTOLIN HFA) 108 (90 Base) MCG/ACT inhaler [id number]  INHALE 2 PUFFS BY MOUTH EVERY 6 HOURS AS NEEDED FOR SHORTNESS OF [name], MD  Active Self  allopurinol (ZYLOPRIM) 300 MG tablet [id number]  TAKE 1 TABLET BY MOUTH TWICE A DAY Malva Limes, MD  Active Self  ALPRAZolam Prudy Feeler) 1 MG tablet [id number]  Take 1  tablet (1 mg total) by mouth 3 (three) times daily as needed. Malva Limes, MD  Active   amiodarone (PACERONE) 200 MG tablet [id number] Yes Take 1 tablet (200 mg total) by mouth daily. Enedina Finner, MD Taking Active Self  apixaban (ELIQUIS) 5 MG TABS tablet [id number] Yes Take 1 tablet (5 mg total) by mouth 2 (two) times daily. Jacky Kindle, FNP Taking Active   atorvastatin  (LIPITOR) 80 MG tablet [id number] Yes TAKE 1 TABLET BY MOUTH AT BEDTIME Malva Limes, MD Taking Active   Blood Glucose Calibration (ACCU-CHEK GUIDE CONTROL) [name] [id number]  Use with blood glucose monitor as directed Malva Limes, MD  Active Self  Blood Glucose Monitoring Suppl (ACCU-CHEK GUIDE) w/Device KIT [id number]  Use to check blood sugars as directed Malva Limes, MD  Active Self  Budeson-Glycopyrrol-Formoterol (BREZTRI AEROSPHERE) 160-9-4.8 MCG/ACT [name] [id number] Yes Inhale 2 puffs into the lungs 2 (two) times daily. Malva Limes, MD Taking Active Self  celecoxib (CELEBREX) 200 MG capsule [id number] Yes Take 1 capsule (200 mg total) by mouth 2 (two) times daily as needed. Home med. [name], MD Taking Active Self  cetirizine (ZYRTEC) 10 MG tablet [id number] Yes TAKE 1 TABLET BY MOUTH AT BEDTIME Malva Limes, MD Taking Active Self  clopidogrel (PLAVIX) 75 MG tablet [id number] Yes Take 1 tablet (75 mg total) by mouth daily. Jacky Kindle, FNP Taking Active   dicyclomine (BENTYL) 10 MG capsule [id number] No Take 1 capsule (10 mg total) by mouth 3 (three) times daily as needed (abdominal pain). Phineas Semen, MD Unknown Active Self  FARXIGA 10 MG TABS tablet [id number] Yes Take 10 mg by mouth daily. [provider] Taking Active Self  glipiZIDE (GLUCOTROL) 5 MG tablet [id number] Yes Take 1 tablet (5 mg total) by mouth daily as needed (sugar over 300). Malva Limes, MD Taking Active Self  glucose blood (ACCU-CHEK GUIDE) test strip [id number]  Use as instructed to check sugar daily for type 2 diabetes. Malva Limes, MD  Active Self  iron polysaccharides (NIFEREX) 150 MG capsule [id number] Yes Take 1 capsule (150 mg total) by mouth daily. Enedina Finner, MD Taking Active Self  isosorbide mononitrate (IMDUR) 120 MG 24 hr tablet [id number] Yes Take 1 tablet (120 mg total) by mouth daily. Jacky Kindle, FNP Taking Active   lisinopril (ZESTRIL) 20 MG tablet [id number] Yes Take 20  mg by mouth daily. [provider] Taking Active Self  meclizine (ANTIVERT) 25 MG tablet [id number]  TAKE ONE TABLET BY THREE TIMES A DAY AS NEEDED FOR DIZZINESS Malva Limes, MD  Active Self  metFORMIN (GLUCOPHAGE-XR) 500 MG 24 hr tablet [id number] Yes Take 1 tablet (500 mg total) by mouth 2 (two) times daily. Jacky Kindle, FNP Taking Active   methocarbamol (ROBAXIN) 500 MG tablet [id number] Yes TAKE 1 TABLET BY MOUTH EVERY 8 HOURS AS NEEDED FOR MUSCLE SPASMS [name], Demetrios Isaacs, MD Taking Active   metoprolol succinate (TOPROL-XL) 25 MG 24 hr tablet [id number] Yes Take 0.5 tablets (12.5 mg total) by mouth daily. Jacky Kindle, FNP Taking Active   nitroGLYCERIN (NITROSTAT) 0.4 MG SL tablet [id number]  Place 1 tablet (0.4 mg total) under the tongue every 5 (five) minutes as needed for chest pain. Alfredia Ferguson, PA-C  Active Self  omega-3 acid ethyl esters (LOVAZA) 1 g capsule [id number] Yes TAKE 4 CAPSULES (4 GRAMS TOTAL) BY Miachel Roux, MD Taking Active Self  ondansetron [facility]) 4 MG tablet [id number]  Take 1 tablet (4 mg total) by mouth every 8 (eight) hours as needed. Phineas Semen, MD  Active Self  oxyCODONE-acetaminophen (PERCOCET) 10-325 MG tablet [id number] Yes TAKE 1 TABLET BY MOUTH EVERY 4 HOURS AS NEEDED FOR PAIN Malva Limes, MD Taking Active   pantoprazole (PROTONIX) 40 MG tablet [id number]  Take 1 tablet (40 mg total) by mouth 2 (two) times daily for 14 days. Celso Amy, PA-C  Expired 07/12/23 2359 Self  ranolazine (RANEXA) 1000 MG SR tablet [id number] No Take 1 tablet (1,000 mg total) by mouth 2 (two) times daily. Arnetha Courser, MD Unknown Active Self  torsemide (DEMADEX) 100 MG tablet [id number] Yes Take 0.5 tablets (50 mg total) by mouth daily. Alfredia Ferguson, PA-C Taking Active Self           Med Note [facility], TINA A   Tue Jul 20, 2023  3:00 PM)    traZODone (DESYREL) 150 MG tablet [id number] Yes TAKE 1 TABLET BY MOUTH AT BEDTIME Malva Limes, MD  Taking Active Self  venlafaxine XR (EFFEXOR-XR) 75 MG 24 hr capsule [id number] Yes TAKE 1 CAPSULE BY MOUTH DAILY WITH BREAKFAST Malva Limes, MD Taking Active Self              Assessment/Plan:   Medication Access/Adherence: Recommended patient speak to his pharmacy regarding getting adherence packaging set up. Pt may also get a pill box to have his son help fill his pill box weekly.  Diabetes: - Currently controlled, at goal A1c <7.0% - Encouraged medication adherence. Continue current regimen.   Heart Failure: - Currently appropriately managed - Reviewed to weigh daily and when to contact cardiology with weight gain - Encouraged medication adherence   Follow Up Plan: Will contact patient in 2-3 weeks to check on plan to set up pill box or adherence packaging.   Arbutus Leas, PharmD, BCPS [facility] [phone]

## 2023-07-21 NOTE — Progress Notes (Signed)
Established patient visit   Patient: Lawrence [name]   DOB: [birth year]   67 y.o. Male  MRN: [id number] Visit Date: 07/21/2023  Today's healthcare provider: Jacky Kindle, FNP  Introduced to nurse practitioner role and practice setting.  All questions answered.  Discussed provider/patient relationship and expectations.  Subjective    HPI HPI     Hospitalization Follow-up    Additional comments: Admitted on 07/11/23-07/15/23. Reports some chest pain. Will see cardio tomorrow.       Last edited by Myles Lipps, CMA on 07/21/2023 10:04 AM.      Follow up Hospitalization  Patient was admitted to [facility] on 8/25 and discharged on 8/29. He was treated for Chest Pain, worsening chronic hypoxic respiratory failure and COPD exacerbation. Treatment for this included steroids, oxygen, continued chronic medications. Telephone follow up was done on 8/30 He reports satisfactory compliance with treatment. He reports this condition is improved.  ----------------------------------------------------------------------------------------- -   Medications: Outpatient Medications Prior to Visit  Medication Sig   Accu-Chek Softclix Lancets lancets Use as instructed to check sugar daily for type 2 diabetes.   acetaminophen (TYLENOL) 325 MG tablet Take 2 tablets (650 mg total) by mouth every 4 (four) hours as needed for headache or mild pain.   albuterol (VENTOLIN HFA) 108 (90 Base) MCG/ACT inhaler INHALE 2 PUFFS BY MOUTH EVERY 6 HOURS AS NEEDED FOR SHORTNESS OF BREATH   allopurinol (ZYLOPRIM) 300 MG tablet TAKE 1 TABLET BY MOUTH TWICE A DAY   ALPRAZolam (XANAX) 1 MG tablet Take 1 tablet (1 mg total) by mouth 3 (three) times daily as needed.   amiodarone (PACERONE) 200 MG tablet Take 1 tablet (200 mg total) by mouth daily.   atorvastatin (LIPITOR) 80 MG tablet TAKE 1 TABLET BY MOUTH AT BEDTIME   Blood Glucose Calibration (ACCU-CHEK GUIDE CONTROL) LIQD Use with blood glucose monitor as directed    Blood Glucose Monitoring Suppl (ACCU-CHEK GUIDE) w/Device KIT Use to check blood sugars as directed   Budeson-Glycopyrrol-Formoterol (BREZTRI AEROSPHERE) 160-9-4.8 MCG/ACT AERO Inhale 2 puffs into the lungs 2 (two) times daily.   celecoxib (CELEBREX) 200 MG capsule Take 1 capsule (200 mg total) by mouth 2 (two) times daily as needed. Home med.   cetirizine (ZYRTEC) 10 MG tablet TAKE 1 TABLET BY MOUTH AT BEDTIME   dicyclomine (BENTYL) 10 MG capsule Take 1 capsule (10 mg total) by mouth 3 (three) times daily as needed (abdominal pain).   FARXIGA 10 MG TABS tablet Take 10 mg by mouth daily.   glipiZIDE (GLUCOTROL) 5 MG tablet Take 1 tablet (5 mg total) by mouth daily as needed (sugar over 300).   glucose blood (ACCU-CHEK GUIDE) test strip Use as instructed to check sugar daily for type 2 diabetes.   iron polysaccharides (NIFEREX) 150 MG capsule Take 1 capsule (150 mg total) by mouth daily.   lisinopril (ZESTRIL) 20 MG tablet Take 20 mg by mouth daily.   meclizine (ANTIVERT) 25 MG tablet TAKE ONE TABLET BY THREE TIMES A DAY AS NEEDED FOR DIZZINESS   methocarbamol (ROBAXIN) 500 MG tablet TAKE 1 TABLET BY MOUTH EVERY 8 HOURS AS NEEDED FOR MUSCLE SPASMS   nitroGLYCERIN (NITROSTAT) 0.4 MG SL tablet Place 1 tablet (0.4 mg total) under the tongue every 5 (five) minutes as needed for chest pain.   omega-3 acid ethyl esters (LOVAZA) 1 g capsule TAKE 4 CAPSULES (4 GRAMS TOTAL) BY MOUTHDAILY   ondansetron (ZOFRAN) 4 MG tablet Take 1 tablet (4 mg total) by  mouth every 8 (eight) hours as needed.   oxyCODONE-acetaminophen (PERCOCET) 10-325 MG tablet TAKE 1 TABLET BY MOUTH EVERY 4 HOURS AS NEEDED FOR PAIN   pantoprazole (PROTONIX) 40 MG tablet Take 1 tablet (40 mg total) by mouth 2 (two) times daily for 14 days.   ranolazine (RANEXA) 1000 MG SR tablet Take 1 tablet (1,000 mg total) by mouth 2 (two) times daily.   torsemide (DEMADEX) 100 MG tablet Take 0.5 tablets (50 mg total) by mouth daily.   traZODone  (DESYREL) 150 MG tablet TAKE 1 TABLET BY MOUTH AT BEDTIME   venlafaxine XR (EFFEXOR-XR) 75 MG 24 hr capsule TAKE 1 CAPSULE BY MOUTH DAILY WITH BREAKFAST   [DISCONTINUED] clopidogrel (PLAVIX) 75 MG tablet Take 1 tablet (75 mg total) by mouth daily.   [DISCONTINUED] ELIQUIS 5 MG TABS tablet TAKE 1 TABLET BY MOUTH TWICE A DAY   [DISCONTINUED] isosorbide mononitrate (IMDUR) 120 MG 24 hr tablet Take 1 tablet (120 mg total) by mouth daily.   [DISCONTINUED] metFORMIN (GLUCOPHAGE-XR) 500 MG 24 hr tablet TAKE 1 TABLET BY MOUTH TWICE A DAY   [DISCONTINUED] metoprolol succinate (TOPROL-XL) 25 MG 24 hr tablet Take 0.5 tablets (12.5 mg total) by mouth daily.   No facility-administered medications prior to visit.   Last CBC Lab Results  Component Value Date   WBC 5.6 07/12/2023   HGB 11.9 (L) 07/12/2023   HCT 38.0 (L) 07/12/2023   MCV 92.0 07/12/2023   MCH 28.8 07/12/2023   RDW 16.6 (H) 07/12/2023   PLT 142 (L) 07/12/2023   Last metabolic panel Lab Results  Component Value Date   GLUCOSE 177 (H) 07/15/2023   NA 137 07/15/2023   K 4.7 07/15/2023   CL 99 07/15/2023   CO2 35 (H) 07/15/2023   BUN 31 (H) 07/15/2023   CREATININE 1.10 07/15/2023   GFRNONAA >60 07/15/2023   CALCIUM 8.8 (L) 07/15/2023   PHOS 3.4 05/03/2023   PROT 6.1 (L) 06/12/2023   ALBUMIN 3.4 (L) 06/12/2023   LABGLOB 2.5 12/23/2021   AGRATIO 1.6 12/23/2021   BILITOT 0.9 06/12/2023   ALKPHOS 49 06/12/2023   AST 13 (L) 06/12/2023   ALT 14 06/12/2023   ANIONGAP 3 (L) 07/15/2023   Last lipids Lab Results  Component Value Date   CHOL 100 03/08/2023   HDL 51 03/08/2023   LDLCALC 35 03/08/2023   LDLDIRECT 122.1 (H) 09/27/2020   TRIG 65 03/08/2023   CHOLHDL 2.0 03/08/2023   Last hemoglobin A1c Lab Results  Component Value Date   HGBA1C 6.6 (H) 03/08/2023   Last thyroid functions Lab Results  Component Value Date   TSH 1.035 12/28/2022     Objective    BP (!) 156/79 (BP Location: Left Arm, Patient Position:  Sitting, Cuff Size: Large)   Pulse (!) 47   Temp (!) 97.5 F (36.4 C) (Temporal)   Resp 16   Ht 5\' 7"  (1.702 m)   Wt 193 lb 11.2 oz (87.9 kg)   SpO2 95%   BMI 30.34 kg/m   BP Readings from Last 3 Encounters:  07/21/23 (!) 156/79  07/20/23 (!) 156/78  07/15/23 116/76   Wt Readings from Last 3 Encounters:  07/21/23 193 lb 11.2 oz (87.9 kg)  07/20/23 194 lb (88 kg)  07/12/23 190 lb (86.2 kg)   Physical Exam Vitals and nursing note reviewed.  Constitutional:      Appearance: Normal appearance. He is obese. He is ill-appearing.  HENT:     Head: Normocephalic and atraumatic.  Cardiovascular:     Rate and Rhythm: Normal rate and regular rhythm.     Pulses: Normal pulses.     Heart sounds: Murmur heard.  Pulmonary:     Effort: Pulmonary effort is normal.     Breath sounds: Normal breath sounds.  Abdominal:     General: Bowel sounds are normal.     Palpations: Abdomen is soft.     Comments: Chronic abdominal fullness iso heart failure  Musculoskeletal:        General: Normal range of motion.     Cervical back: Normal range of motion.  Skin:    General: Skin is warm and dry.     Capillary Refill: Capillary refill takes less than 2 seconds.  Neurological:     Mental Status: He is alert and oriented to person, place, and time.     Motor: Weakness present.     Gait: Gait abnormal.  Psychiatric:        Mood and Affect: Mood normal.        Behavior: Behavior normal.        Thought Content: Thought content normal.        Cognition and Memory: Cognition is impaired. Memory is impaired. He exhibits impaired recent memory and impaired remote memory.        Judgment: Judgment normal.     Comments: Pt unable to correct identify medication, today's date including month and day of week as well as follow up appt schedules.      No results found for any visits on 07/21/23.  Assessment & Plan     Problem List Items Addressed This Visit       Cardiovascular and Mediastinum    Acute on chronic congestive heart failure (HCC)    Continues to have chronic chest pain given acute on chronic heart failure and demand ischemia with known troponin leak.   Followed by heart failure and cardiology.       Relevant Medications   apixaban (ELIQUIS) 5 MG TABS tablet   isosorbide mononitrate (IMDUR) 120 MG 24 hr tablet   metoprolol succinate (TOPROL-XL) 25 MG 24 hr tablet   Other Relevant Orders   Basic Metabolic Panel (BMET)   Hemoglobin A1c   Urine Microalbumin w/creat. ratio   CBC with Differential/Platelet   Iron, TIBC and Ferritin Panel   Vitamin D (25 hydroxy)   B12 and Folate Panel   Uric acid     Respiratory   Acute bronchitis with COPD (HCC)    Acute on chronic; no adventitious lung sounds appreciated Pt without o2 at office visit; notes concern for proper equipment and is trying to receive a portable o2 tank to assist       Relevant Orders   Basic Metabolic Panel (BMET)   Hemoglobin A1c   Urine Microalbumin w/creat. ratio   CBC with Differential/Platelet   Iron, TIBC and Ferritin Panel   Vitamin D (25 hydroxy)   B12 and Folate Panel   Uric acid     Endocrine   Diabetes mellitus (HCC)    Chronic, due for dm eye exam; reports appt [place] 9/24; foot exam completed. Labs repeated Chronic neuropathy with thickened dystrophic nails with concern for fungal infection       Relevant Medications   metFORMIN (GLUCOPHAGE-XR) 500 MG 24 hr tablet   Other Relevant Orders   Ambulatory referral to Podiatry     Other   FTT (failure to thrive) in adult - Primary    Chronic, worsening  Contact with CM and SW team to assist with possible home health vs placement options to assist with concerns for chronic care concerns given poor mobility, lack of assistive equipment (O2) as well as confusion and concern for unintentional self harm given access to controled substances       Hospital discharge follow-up    Chronic multisystem organ involvement; pt notes poor  self care behaviors at home Stabilized at this time; unaware of medications or what needs he has Confusion noted       Other Visit Diagnoses     Encounter for immunization       Relevant Orders   Flu Vaccine Trivalent High Dose (Fluad) (Completed)      No follow-ups on file.     Leilani Merl, FNP, have reviewed all documentation for this visit. The documentation on 07/21/23 for the exam, diagnosis, procedures, and orders are all accurate and complete.  Jacky Kindle, FNP  [facility] [phone] (phone) [phone] (fax)  [facility]

## 2023-07-21 NOTE — ED Triage Notes (Signed)
Pt to ED [facility] from home. Recently had covid. +shob, fatigue.  Pt wears 2 L [place] chronic. Has not been wearing O2 today because he just wanted to take it off. 89% on RA with EMS. Placed on 2 L [place] in triage.  Hx COPD Reports chest pain that just started.

## 2023-07-21 NOTE — Assessment & Plan Note (Signed)
Acute on chronic; no adventitious lung sounds appreciated Pt without o2 at office visit; notes concern for proper equipment and is trying to receive a portable o2 tank to assist

## 2023-07-21 NOTE — Assessment & Plan Note (Signed)
Chronic, worsening Contact with CM and SW team to assist with possible home health vs placement options to assist with concerns for chronic care concerns given poor mobility, lack of assistive equipment (O2) as well as confusion and concern for unintentional self harm given access to controled substances

## 2023-07-21 NOTE — Assessment & Plan Note (Signed)
>>  ASSESSMENT AND PLAN FOR DIABETES MELLITUS (HCC) WRITTEN ON 07/21/2023 11:48 AM BY PAYNE, ELISE T, FNP  Chronic, due for dm eye exam; reports appt [place] 9/24; foot exam completed. Labs repeated Chronic neuropathy with thickened dystrophic nails with concern for fungal infection

## 2023-07-21 NOTE — Assessment & Plan Note (Signed)
Chronic multisystem organ involvement; pt notes poor self care behaviors at home Stabilized at this time; unaware of medications or what needs he has Confusion noted

## 2023-07-21 NOTE — Assessment & Plan Note (Signed)
Chronic, due for dm eye exam; reports appt [place] 9/24; foot exam completed. Labs repeated Chronic neuropathy with thickened dystrophic nails with concern for fungal infection

## 2023-07-22 ENCOUNTER — Telehealth: Payer: Self-pay | Admitting: *Deleted

## 2023-07-22 ENCOUNTER — Other Ambulatory Visit: Payer: Self-pay

## 2023-07-22 ENCOUNTER — Encounter: Payer: Self-pay | Admitting: *Deleted

## 2023-07-22 DIAGNOSIS — R110 Nausea: Secondary | ICD-10-CM | POA: Diagnosis not present

## 2023-07-22 DIAGNOSIS — N179 Acute kidney failure, unspecified: Secondary | ICD-10-CM | POA: Diagnosis not present

## 2023-07-22 DIAGNOSIS — Z951 Presence of aortocoronary bypass graft: Secondary | ICD-10-CM | POA: Diagnosis not present

## 2023-07-22 DIAGNOSIS — G4733 Obstructive sleep apnea (adult) (pediatric): Secondary | ICD-10-CM | POA: Diagnosis not present

## 2023-07-22 DIAGNOSIS — I110 Hypertensive heart disease with heart failure: Secondary | ICD-10-CM | POA: Diagnosis not present

## 2023-07-22 DIAGNOSIS — R9431 Abnormal electrocardiogram [ECG] [EKG]: Secondary | ICD-10-CM | POA: Diagnosis not present

## 2023-07-22 DIAGNOSIS — R001 Bradycardia, unspecified: Secondary | ICD-10-CM | POA: Diagnosis not present

## 2023-07-22 DIAGNOSIS — E119 Type 2 diabetes mellitus without complications: Secondary | ICD-10-CM | POA: Diagnosis not present

## 2023-07-22 DIAGNOSIS — I509 Heart failure, unspecified: Secondary | ICD-10-CM | POA: Diagnosis not present

## 2023-07-22 DIAGNOSIS — I4891 Unspecified atrial fibrillation: Secondary | ICD-10-CM | POA: Diagnosis not present

## 2023-07-22 DIAGNOSIS — I5032 Chronic diastolic (congestive) heart failure: Secondary | ICD-10-CM | POA: Diagnosis not present

## 2023-07-22 DIAGNOSIS — I480 Paroxysmal atrial fibrillation: Secondary | ICD-10-CM | POA: Diagnosis not present

## 2023-07-22 DIAGNOSIS — R112 Nausea with vomiting, unspecified: Secondary | ICD-10-CM | POA: Diagnosis not present

## 2023-07-22 DIAGNOSIS — J449 Chronic obstructive pulmonary disease, unspecified: Secondary | ICD-10-CM | POA: Diagnosis not present

## 2023-07-22 DIAGNOSIS — R5383 Other fatigue: Secondary | ICD-10-CM | POA: Diagnosis not present

## 2023-07-22 DIAGNOSIS — I255 Ischemic cardiomyopathy: Secondary | ICD-10-CM | POA: Diagnosis not present

## 2023-07-22 DIAGNOSIS — R079 Chest pain, unspecified: Secondary | ICD-10-CM | POA: Diagnosis not present

## 2023-07-22 DIAGNOSIS — K760 Fatty (change of) liver, not elsewhere classified: Secondary | ICD-10-CM | POA: Diagnosis not present

## 2023-07-22 DIAGNOSIS — I2510 Atherosclerotic heart disease of native coronary artery without angina pectoris: Secondary | ICD-10-CM | POA: Diagnosis not present

## 2023-07-22 DIAGNOSIS — R531 Weakness: Secondary | ICD-10-CM | POA: Diagnosis not present

## 2023-07-22 DIAGNOSIS — J9811 Atelectasis: Secondary | ICD-10-CM | POA: Diagnosis not present

## 2023-07-22 DIAGNOSIS — R0600 Dyspnea, unspecified: Secondary | ICD-10-CM | POA: Diagnosis not present

## 2023-07-22 DIAGNOSIS — I10 Essential (primary) hypertension: Secondary | ICD-10-CM | POA: Diagnosis not present

## 2023-07-22 LAB — MICROALBUMIN / CREATININE URINE RATIO
Creatinine, Urine: 70.9 mg/dL
Microalb/Creat Ratio: 20 mg/g{creat} (ref 0–29)
Microalbumin, Urine: 14.3 ug/mL

## 2023-07-22 LAB — CBC WITH DIFFERENTIAL/PLATELET
Basophils Absolute: 0 10*3/uL (ref 0.0–0.2)
Basos: 1 %
EOS (ABSOLUTE): 0.2 10*3/uL (ref 0.0–0.4)
Eos: 4 %
Hematocrit: 44.7 % (ref 37.5–51.0)
Hemoglobin: 14.3 g/dL (ref 13.0–17.7)
Immature Grans (Abs): 0.1 10*3/uL (ref 0.0–0.1)
Immature Granulocytes: 2 %
Lymphocytes Absolute: 1.7 10*3/uL (ref 0.7–3.1)
Lymphs: 29 %
MCH: 29 pg (ref 26.6–33.0)
MCHC: 32 g/dL (ref 31.5–35.7)
MCV: 91 fL (ref 79–97)
Monocytes Absolute: 0.5 10*3/uL (ref 0.1–0.9)
Monocytes: 9 %
Neutrophils Absolute: 3.2 10*3/uL (ref 1.4–7.0)
Neutrophils: 55 %
Platelets: 171 10*3/uL (ref 150–450)
RBC: 4.93 x10E6/uL (ref 4.14–5.80)
RDW: 16 % — ABNORMAL HIGH (ref 11.6–15.4)
WBC: 5.7 10*3/uL (ref 3.4–10.8)

## 2023-07-22 LAB — IRON,TIBC AND FERRITIN PANEL
Ferritin: 58 ng/mL (ref 30–400)
Iron Saturation: 21 % (ref 15–55)
Iron: 61 ug/dL (ref 38–169)
Total Iron Binding Capacity: 290 ug/dL (ref 250–450)
UIBC: 229 ug/dL (ref 111–343)

## 2023-07-22 LAB — BASIC METABOLIC PANEL
BUN/Creatinine Ratio: 18 (ref 10–24)
BUN: 16 mg/dL (ref 8–27)
CO2: 32 mmol/L — ABNORMAL HIGH (ref 20–29)
Calcium: 9.9 mg/dL (ref 8.6–10.2)
Chloride: 98 mmol/L (ref 96–106)
Creatinine, Ser: 0.91 mg/dL (ref 0.76–1.27)
Glucose: 114 mg/dL — ABNORMAL HIGH (ref 70–99)
Potassium: 5.2 mmol/L (ref 3.5–5.2)
Sodium: 140 mmol/L (ref 134–144)
eGFR: 91 mL/min/{1.73_m2} (ref >59)

## 2023-07-22 LAB — HEMOGLOBIN A1C
Est. average glucose Bld gHb Est-mCnc: 128 mg/dL
Hgb A1c MFr Bld: 6.1 % — ABNORMAL HIGH (ref 4.8–5.6)

## 2023-07-22 LAB — B12 AND FOLATE PANEL
Folate: 9.9 ng/mL (ref >3.0)
Vitamin B-12: 725 pg/mL (ref 232–1245)

## 2023-07-22 LAB — URIC ACID: Uric Acid: 3 mg/dL — ABNORMAL LOW (ref 3.8–8.4)

## 2023-07-22 LAB — VITAMIN D 25 HYDROXY (VIT D DEFICIENCY, FRACTURES): Vit D, 25-Hydroxy: 22.1 ng/mL — ABNORMAL LOW (ref 30.0–100.0)

## 2023-07-22 NOTE — Patient Outreach (Signed)
  Care Coordination   Collaboration  Visit Note   07/22/2023 Name: Lawrence [name] MRN: [id number] DOB: August 28, 1956  Lawrence [name] is a 67 y.o. year old male who sees [name], Demetrios Isaacs, MD for primary care. I  collaborated with Medical illustrator regarding patient's care needs  What matters to the patients health and wellness today?  In home care options.    Goals Addressed             This Visit's Progress    Care Coordination Activities       Interventions Today    Flowsheet Row Most Recent Value  General Interventions   General Interventions Discussed/Reviewed Communication with  Communication with RN  [collaboration with RNCM re,  pt's care needs-pr agreeable to para-medicine and ST rehab if needed following hospitalization, declining LTC-pt's son agreeable to assisting pt in the home, CSW to follow up to complete assessment for community resource needs]              SDOH assessments and interventions completed:  No     Care Coordination Interventions:  Yes, provided   Follow up plan:  Follow up appointment to be scheduled with patient to complete assessment for community resource needs    Encounter Outcome:  Patient Visit Completed

## 2023-07-22 NOTE — Progress Notes (Signed)
Improved diabetes numbers; Continue to recommend balanced, lower carb meals. Smaller meal size, adding snacks. Choosing water as drink of choice and increasing purposeful exercise.  Improved anemia. Stable iron stores. Vit D remains low; recommend 5000 IU Vit D 3 supplement OTC daily. B12 and folate stable.  Urine pending.

## 2023-07-22 NOTE — Patient Outreach (Signed)
  Care Coordination   07/22/2023 Name: Lawrence [name] MRN: [id number] DOB: [birth year]   Care Coordination Outreach Attempts:  An unsuccessful telephone outreach was attempted today to offer the patient information about available care coordination services.  Follow Up Plan:  Additional outreach attempts will be made to offer the patient care coordination information and services.   Encounter Outcome:  No Answer   Care Coordination Interventions:  No, not indicated    Jonisha Kindig, LCSW [place]  [facility], [facility] Licensed Clinical Social Worker Care Coordinator  Direct Dial: [phone]

## 2023-07-23 ENCOUNTER — Other Ambulatory Visit: Payer: Self-pay

## 2023-07-23 DIAGNOSIS — R001 Bradycardia, unspecified: Secondary | ICD-10-CM | POA: Diagnosis not present

## 2023-07-23 DIAGNOSIS — I2510 Atherosclerotic heart disease of native coronary artery without angina pectoris: Secondary | ICD-10-CM | POA: Diagnosis not present

## 2023-07-23 DIAGNOSIS — R112 Nausea with vomiting, unspecified: Secondary | ICD-10-CM | POA: Diagnosis not present

## 2023-07-23 DIAGNOSIS — I10 Essential (primary) hypertension: Secondary | ICD-10-CM | POA: Diagnosis not present

## 2023-07-23 DIAGNOSIS — R9431 Abnormal electrocardiogram [ECG] [EKG]: Secondary | ICD-10-CM | POA: Diagnosis not present

## 2023-07-23 DIAGNOSIS — R531 Weakness: Secondary | ICD-10-CM | POA: Diagnosis not present

## 2023-07-23 DIAGNOSIS — J449 Chronic obstructive pulmonary disease, unspecified: Secondary | ICD-10-CM | POA: Diagnosis not present

## 2023-07-23 DIAGNOSIS — I4891 Unspecified atrial fibrillation: Secondary | ICD-10-CM | POA: Diagnosis not present

## 2023-07-23 NOTE — Progress Notes (Signed)
Stable urine micro; repeat every 6-12 months.

## 2023-07-23 NOTE — Patient Outreach (Signed)
Care Management   Visit Note  07/23/2023 Name: Lawrence [name] MRN: [id number] DOB: 1956/07/30  Subjective: Lawrence [name] is a 67 y.o. year old male who is a primary care patient of [name], Demetrios Isaacs, MD. The Care Management team was consulted for assistance.      Engaged with patient by telephone.  Assessment:  Outpatient Encounter Medications as of 07/23/2023  Medication Sig   Accu-Chek Softclix Lancets lancets Use as instructed to check sugar daily for type 2 diabetes.   allopurinol (ZYLOPRIM) 300 MG tablet TAKE 1 TABLET BY MOUTH TWICE A DAY (Patient not taking: Reported on 08/23/2023)   ALPRAZolam (XANAX) 1 MG tablet Take 1 tablet (1 mg total) by mouth 3 (three) times daily as needed. (Patient taking differently: Take 1 mg by mouth 3 (three) times daily.)   atorvastatin (LIPITOR) 80 MG tablet TAKE 1 TABLET BY MOUTH AT BEDTIME   Blood Glucose Calibration (ACCU-CHEK GUIDE CONTROL) LIQD Use with blood glucose monitor as directed   Blood Glucose Monitoring Suppl (ACCU-CHEK GUIDE) w/Device KIT Use to check blood sugars as directed   Budeson-Glycopyrrol-Formoterol (BREZTRI AEROSPHERE) 160-9-4.8 MCG/ACT AERO Inhale 2 puffs into the lungs 2 (two) times daily.   cetirizine (ZYRTEC) 10 MG tablet TAKE 1 TABLET BY MOUTH AT BEDTIME (Patient not taking: Reported on 08/23/2023)   dicyclomine (BENTYL) 10 MG capsule Take 1 capsule (10 mg total) by mouth 3 (three) times daily as needed (abdominal pain). (Patient not taking: Reported on 08/23/2023)   [EXPIRED] doxycycline (VIBRAMYCIN) 100 MG capsule Take 1 capsule (100 mg total) by mouth 2 (two) times daily for 7 days.   FARXIGA 10 MG TABS tablet Take 10 mg by mouth daily.   glipiZIDE (GLUCOTROL) 5 MG tablet Take 1 tablet (5 mg total) by mouth daily as needed (sugar over 300). (Patient not taking: Reported on 08/23/2023)   glucose blood (ACCU-CHEK GUIDE) test strip Use as instructed to check sugar daily for type 2 diabetes.   iron polysaccharides (NIFEREX) 150  MG capsule Take 1 capsule (150 mg total) by mouth daily.   metFORMIN (GLUCOPHAGE-XR) 500 MG 24 hr tablet Take 1 tablet (500 mg total) by mouth 2 (two) times daily. (Patient taking differently: Take 500 mg by mouth daily.)   methocarbamol (ROBAXIN) 500 MG tablet TAKE 1 TABLET BY MOUTH EVERY 8 HOURS AS NEEDED FOR MUSCLE SPASMS   omega-3 acid ethyl esters (LOVAZA) 1 g capsule TAKE 4 CAPSULES (4 GRAMS TOTAL) BY MOUTHDAILY   pantoprazole (PROTONIX) 40 MG tablet Take 1 tablet (40 mg total) by mouth 2 (two) times daily for 14 days.   [EXPIRED] predniSONE (DELTASONE) 20 MG tablet Take 3 tablets (60 mg total) by mouth daily with breakfast for 5 days.   traZODone (DESYREL) 150 MG tablet TAKE 1 TABLET BY MOUTH AT BEDTIME (Patient taking differently: Take 100 mg by mouth at bedtime.)   [DISCONTINUED] acetaminophen (TYLENOL) 325 MG tablet Take 2 tablets (650 mg total) by mouth every 4 (four) hours as needed for headache or mild pain.   [DISCONTINUED] albuterol (VENTOLIN HFA) 108 (90 Base) MCG/ACT inhaler INHALE 2 PUFFS BY MOUTH EVERY 6 HOURS AS NEEDED FOR SHORTNESS OF BREATH (Patient not taking: Reported on 08/05/2023)   [DISCONTINUED] amiodarone (PACERONE) 200 MG tablet Take 1 tablet (200 mg total) by mouth daily.   [DISCONTINUED] apixaban (ELIQUIS) 5 MG TABS tablet Take 1 tablet (5 mg total) by mouth 2 (two) times daily.   [DISCONTINUED] celecoxib (CELEBREX) 200 MG capsule Take 1 capsule (200 mg total)  by mouth 2 (two) times daily as needed. Home med.   [DISCONTINUED] clopidogrel (PLAVIX) 75 MG tablet Take 1 tablet (75 mg total) by mouth daily.   [DISCONTINUED] isosorbide mononitrate (IMDUR) 120 MG 24 hr tablet Take 1 tablet (120 mg total) by mouth daily.   [DISCONTINUED] lisinopril (ZESTRIL) 20 MG tablet Take 20 mg by mouth daily.   [DISCONTINUED] meclizine (ANTIVERT) 25 MG tablet TAKE ONE TABLET BY THREE TIMES A DAY AS NEEDED FOR DIZZINESS   [DISCONTINUED] metoprolol succinate (TOPROL-XL) 25 MG 24 hr tablet  Take 0.5 tablets (12.5 mg total) by mouth daily.   [DISCONTINUED] nitroGLYCERIN (NITROSTAT) 0.4 MG SL tablet Place 1 tablet (0.4 mg total) under the tongue every 5 (five) minutes as needed for chest pain.   [DISCONTINUED] ondansetron (ZOFRAN) 4 MG tablet Take 1 tablet (4 mg total) by mouth every 8 (eight) hours as needed. (Patient not taking: Reported on 08/05/2023)   [DISCONTINUED] oxyCODONE-acetaminophen (PERCOCET) 10-325 MG tablet TAKE 1 TABLET BY MOUTH EVERY 4 HOURS AS NEEDED FOR PAIN   [DISCONTINUED] ranolazine (RANEXA) 1000 MG SR tablet Take 1 tablet (1,000 mg total) by mouth 2 (two) times daily. (Patient not taking: Reported on 08/05/2023)   [DISCONTINUED] torsemide (DEMADEX) 100 MG tablet Take 0.5 tablets (50 mg total) by mouth daily.   [DISCONTINUED] venlafaxine XR (EFFEXOR-XR) 75 MG 24 hr capsule TAKE 1 CAPSULE BY MOUTH DAILY WITH BREAKFAST (Patient not taking: Reported on 08/05/2023)   [DISCONTINUED] ALPRAZolam (XANAX) tablet    [DISCONTINUED] amLODipine (NORVASC) tablet    [DISCONTINUED] celecoxib (CELEBREX) capsule    [DISCONTINUED] GENERIC EXTERNAL MEDICATION    [DISCONTINUED] GENERIC EXTERNAL MEDICATION    [DISCONTINUED] GENERIC EXTERNAL MEDICATION    [DISCONTINUED] GENERIC EXTERNAL MEDICATION    [DISCONTINUED] GENERIC EXTERNAL MEDICATION    [DISCONTINUED] GENERIC EXTERNAL MEDICATION    [DISCONTINUED] GENERIC EXTERNAL MEDICATION    [DISCONTINUED] GENERIC EXTERNAL MEDICATION    [DISCONTINUED] isosorbide mononitrate (IMDUR) 24 hr tablet    [DISCONTINUED] lidocaine 4 %    [DISCONTINUED] lisinopril (ZESTRIL) tablet    No facility-administered encounter medications on file as of 07/23/2023.    Interventions:   Goals Addressed             This Visit's Progress    Care Management       Current Barriers:  Care Management support and education needs related to Multiple Chronic Diseases Requires additional Care Giver support in the home   COPD Interventions:   Reviewed plan for  COPD management.  Patient was evaluated for recently admitted and evaluated for chest pain, shortness of breath and nausea.  Reviewed recent discharge instructions. Reports receiving call from [facility] He is agreeable to  home health services. Reviewed medications. Reports taking all medications as prescribed. Reports being very "winded" today with minimal activity. Reports using supplemental oxygen at 2L/min as advised.  Discussed ability to complete tasks in the home. Reports his activity tolerance has significantly declined. Patient currently lives alone but reports his son is available to assist as needed. Plan is for his grandson to relocate from [facility] to move in and serve as a caregiver. Agreed to keep the care team updated of in home needs. Provided information regarding infection prevention and increased risk r/t COPD. Advised to utilize prevention strategies to reduce risk of respiratory infection . Thorough discussion regarding worsening symptoms that require immediate medical attention. Patient is agreeable to calling 911 or having his son transport him to the nearest Emergency Room if his symptoms worsen. Update 9/5: Patient was  evaluated at [facility] Emergency Room for COPD exacerbation. Reports continued symptoms of chest pain, shortness of breath and nausea. Discussed need to return to ER. Patient is agreeable. Will call 911. Update 9/6: Patient reports he is currently at [facility] being evaluated. Reports plan is for admission and possible rehab following discharge. The care team will continue to collaborate regarding in home needs after discharge d/t multiple hospitalizations and significant decline in physical abilities. Reports his son [name] is still available to assist but works during the day. Confirmed that his grandson still plans to serve as a caregiver in the home. He prefers this option to long term care in a facility.                PLAN:  Pending patient's discharge from  [facility]  [facility] RN Care Manager Direct Dial: [phone] Website: [email or web address]

## 2023-07-24 DIAGNOSIS — I2510 Atherosclerotic heart disease of native coronary artery without angina pectoris: Secondary | ICD-10-CM | POA: Diagnosis not present

## 2023-07-24 DIAGNOSIS — J449 Chronic obstructive pulmonary disease, unspecified: Secondary | ICD-10-CM | POA: Diagnosis not present

## 2023-07-24 DIAGNOSIS — R112 Nausea with vomiting, unspecified: Secondary | ICD-10-CM | POA: Diagnosis not present

## 2023-07-24 DIAGNOSIS — I10 Essential (primary) hypertension: Secondary | ICD-10-CM | POA: Diagnosis not present

## 2023-07-24 DIAGNOSIS — I4891 Unspecified atrial fibrillation: Secondary | ICD-10-CM | POA: Diagnosis not present

## 2023-07-24 DIAGNOSIS — R079 Chest pain, unspecified: Secondary | ICD-10-CM | POA: Diagnosis not present

## 2023-07-25 DIAGNOSIS — R112 Nausea with vomiting, unspecified: Secondary | ICD-10-CM | POA: Diagnosis not present

## 2023-07-25 DIAGNOSIS — I2510 Atherosclerotic heart disease of native coronary artery without angina pectoris: Secondary | ICD-10-CM | POA: Diagnosis not present

## 2023-07-25 DIAGNOSIS — J449 Chronic obstructive pulmonary disease, unspecified: Secondary | ICD-10-CM | POA: Diagnosis not present

## 2023-07-25 DIAGNOSIS — N179 Acute kidney failure, unspecified: Secondary | ICD-10-CM | POA: Diagnosis not present

## 2023-07-25 DIAGNOSIS — R079 Chest pain, unspecified: Secondary | ICD-10-CM | POA: Diagnosis not present

## 2023-07-25 DIAGNOSIS — I4891 Unspecified atrial fibrillation: Secondary | ICD-10-CM | POA: Diagnosis not present

## 2023-07-25 DIAGNOSIS — I10 Essential (primary) hypertension: Secondary | ICD-10-CM | POA: Diagnosis not present

## 2023-07-26 DIAGNOSIS — N179 Acute kidney failure, unspecified: Secondary | ICD-10-CM | POA: Diagnosis not present

## 2023-07-26 DIAGNOSIS — I2510 Atherosclerotic heart disease of native coronary artery without angina pectoris: Secondary | ICD-10-CM | POA: Diagnosis not present

## 2023-07-26 DIAGNOSIS — J449 Chronic obstructive pulmonary disease, unspecified: Secondary | ICD-10-CM | POA: Diagnosis not present

## 2023-07-26 DIAGNOSIS — I10 Essential (primary) hypertension: Secondary | ICD-10-CM | POA: Diagnosis not present

## 2023-07-26 DIAGNOSIS — R079 Chest pain, unspecified: Secondary | ICD-10-CM | POA: Diagnosis not present

## 2023-07-26 DIAGNOSIS — I4891 Unspecified atrial fibrillation: Secondary | ICD-10-CM | POA: Diagnosis not present

## 2023-07-27 ENCOUNTER — Encounter: Payer: Medicare HMO | Admitting: *Deleted

## 2023-07-28 DIAGNOSIS — R079 Chest pain, unspecified: Secondary | ICD-10-CM | POA: Diagnosis not present

## 2023-07-28 DIAGNOSIS — J9811 Atelectasis: Secondary | ICD-10-CM | POA: Diagnosis not present

## 2023-07-29 ENCOUNTER — Telehealth: Payer: Self-pay

## 2023-07-29 NOTE — Telephone Encounter (Signed)
Copied from CRM [id number]. Topic: Appointment Scheduling - Scheduling Inquiry for Clinic >> Jul 29, 2023 12:55 PM Payton Doughty wrote: Reason for CRM: Dr Shirlee Latch called from [facility] hospital called b/c pt had appt tomorrow but needs 3 days for transportation.  So she asked if I could schedule something for next week.  The only way I could was to change to a SAME DAY appt on Tues next week.  This call was during your lunch hour.   If not ok, I can call pt back w/ a different time/date.  Thank you

## 2023-07-30 ENCOUNTER — Inpatient Hospital Stay: Payer: Medicare HMO | Admitting: Family Medicine

## 2023-08-02 ENCOUNTER — Other Ambulatory Visit: Payer: Self-pay

## 2023-08-02 ENCOUNTER — Encounter: Payer: Self-pay | Admitting: Emergency Medicine

## 2023-08-02 ENCOUNTER — Emergency Department: Payer: Medicare HMO

## 2023-08-02 ENCOUNTER — Telehealth: Payer: Self-pay

## 2023-08-02 ENCOUNTER — Ambulatory Visit: Payer: Medicare HMO | Attending: Family Medicine

## 2023-08-02 DIAGNOSIS — Z7901 Long term (current) use of anticoagulants: Secondary | ICD-10-CM | POA: Insufficient documentation

## 2023-08-02 DIAGNOSIS — I4891 Unspecified atrial fibrillation: Secondary | ICD-10-CM | POA: Insufficient documentation

## 2023-08-02 DIAGNOSIS — E119 Type 2 diabetes mellitus without complications: Secondary | ICD-10-CM | POA: Insufficient documentation

## 2023-08-02 DIAGNOSIS — R0789 Other chest pain: Secondary | ICD-10-CM | POA: Diagnosis not present

## 2023-08-02 DIAGNOSIS — I10 Essential (primary) hypertension: Secondary | ICD-10-CM | POA: Diagnosis not present

## 2023-08-02 DIAGNOSIS — Z87891 Personal history of nicotine dependence: Secondary | ICD-10-CM | POA: Diagnosis not present

## 2023-08-02 DIAGNOSIS — R079 Chest pain, unspecified: Secondary | ICD-10-CM | POA: Insufficient documentation

## 2023-08-02 DIAGNOSIS — J45909 Unspecified asthma, uncomplicated: Secondary | ICD-10-CM | POA: Insufficient documentation

## 2023-08-02 DIAGNOSIS — Z7984 Long term (current) use of oral hypoglycemic drugs: Secondary | ICD-10-CM | POA: Insufficient documentation

## 2023-08-02 DIAGNOSIS — I5032 Chronic diastolic (congestive) heart failure: Secondary | ICD-10-CM | POA: Diagnosis not present

## 2023-08-02 DIAGNOSIS — I110 Hypertensive heart disease with heart failure: Secondary | ICD-10-CM | POA: Diagnosis not present

## 2023-08-02 DIAGNOSIS — J449 Chronic obstructive pulmonary disease, unspecified: Secondary | ICD-10-CM | POA: Insufficient documentation

## 2023-08-02 DIAGNOSIS — G8929 Other chronic pain: Secondary | ICD-10-CM | POA: Diagnosis not present

## 2023-08-02 DIAGNOSIS — I2510 Atherosclerotic heart disease of native coronary artery without angina pectoris: Secondary | ICD-10-CM | POA: Diagnosis not present

## 2023-08-02 DIAGNOSIS — Z85828 Personal history of other malignant neoplasm of skin: Secondary | ICD-10-CM | POA: Diagnosis not present

## 2023-08-02 DIAGNOSIS — W19XXXA Unspecified fall, initial encounter: Secondary | ICD-10-CM | POA: Diagnosis not present

## 2023-08-02 NOTE — Telephone Encounter (Signed)
Copied from CRM [id number]. Topic: General - Other >> Aug 02, 2023  1:19 PM Turkey B wrote: Reason for CRM: Dewana from [facility], states will be out ot pt for PT therapy tomorrow. Ext for her phone# is [phone]

## 2023-08-02 NOTE — ED Triage Notes (Addendum)
EMS brings pt in from home for c/o mid CP tonight, radiating to left side with no known symptoms

## 2023-08-03 ENCOUNTER — Emergency Department
Admission: EM | Admit: 2023-08-03 | Discharge: 2023-08-03 | Disposition: A | Discharge status: Home / Self Care | Payer: Medicare HMO | Attending: Emergency Medicine | Admitting: Emergency Medicine

## 2023-08-03 ENCOUNTER — Ambulatory Visit: Payer: Medicare HMO | Admitting: Family Medicine

## 2023-08-03 DIAGNOSIS — G8929 Other chronic pain: Secondary | ICD-10-CM

## 2023-08-03 DIAGNOSIS — Z7401 Bed confinement status: Secondary | ICD-10-CM | POA: Diagnosis not present

## 2023-08-03 DIAGNOSIS — R079 Chest pain, unspecified: Secondary | ICD-10-CM | POA: Diagnosis not present

## 2023-08-03 DIAGNOSIS — I10 Essential (primary) hypertension: Secondary | ICD-10-CM | POA: Diagnosis not present

## 2023-08-03 LAB — CBC WITH DIFFERENTIAL/PLATELET
Abs Immature Granulocytes: 0.04 10*3/uL (ref 0.00–0.07)
Basophils Absolute: 0 10*3/uL (ref 0.0–0.1)
Basophils Relative: 0 %
Eosinophils Absolute: 0.1 10*3/uL (ref 0.0–0.5)
Eosinophils Relative: 1 %
HCT: 45.6 % (ref 39.0–52.0)
Hemoglobin: 14.4 g/dL (ref 13.0–17.0)
Immature Granulocytes: 1 %
Lymphocytes Relative: 18 %
Lymphs Abs: 1.3 10*3/uL (ref 0.7–4.0)
MCH: 29 pg (ref 26.0–34.0)
MCHC: 31.6 g/dL (ref 30.0–36.0)
MCV: 91.9 fL (ref 80.0–100.0)
Monocytes Absolute: 0.7 10*3/uL (ref 0.1–1.0)
Monocytes Relative: 9 %
Neutro Abs: 5.1 10*3/uL (ref 1.7–7.7)
Neutrophils Relative %: 71 %
Platelets: 177 10*3/uL (ref 150–400)
RBC: 4.96 MIL/uL (ref 4.22–5.81)
RDW: 16.4 % — ABNORMAL HIGH (ref 11.5–15.5)
WBC: 7.2 10*3/uL (ref 4.0–10.5)
nRBC: 0 % (ref 0.0–0.2)

## 2023-08-03 LAB — COMPREHENSIVE METABOLIC PANEL
ALT: 15 U/L (ref 0–44)
AST: 16 U/L (ref 15–41)
Albumin: 3.8 g/dL (ref 3.5–5.0)
Alkaline Phosphatase: 64 U/L (ref 38–126)
Anion gap: 12 (ref 5–15)
BUN: 22 mg/dL (ref 8–23)
CO2: 28 mmol/L (ref 22–32)
Calcium: 9.3 mg/dL (ref 8.9–10.3)
Chloride: 100 mmol/L (ref 98–111)
Creatinine, Ser: 1.1 mg/dL (ref 0.61–1.24)
GFR, Estimated: >60 mL/min (ref >60)
Glucose, Bld: 100 mg/dL — ABNORMAL HIGH (ref 70–99)
Potassium: 3.8 mmol/L (ref 3.5–5.1)
Sodium: 140 mmol/L (ref 135–145)
Total Bilirubin: 0.8 mg/dL (ref 0.3–1.2)
Total Protein: 6.7 g/dL (ref 6.5–8.1)

## 2023-08-03 LAB — TROPONIN I (HIGH SENSITIVITY)
Troponin I (High Sensitivity): 20 ng/L — ABNORMAL HIGH (ref <18)
Troponin I (High Sensitivity): 27 ng/L — ABNORMAL HIGH (ref <18)

## 2023-08-03 MED ORDER — ONDANSETRON HCL 4 MG/2ML IJ SOLN
4.0000 mg | Freq: Once | INTRAMUSCULAR | Status: AC
  Administered 2023-08-03: 4 mg via INTRAVENOUS
  Filled 2023-08-03: qty 2

## 2023-08-03 MED ORDER — OXYCODONE-ACETAMINOPHEN 5-325 MG PO TABS
1.0000 | ORAL_TABLET | Freq: Once | ORAL | Status: AC
  Administered 2023-08-03: 1 via ORAL
  Filled 2023-08-03: qty 1

## 2023-08-03 MED ORDER — IPRATROPIUM-ALBUTEROL 0.5-2.5 (3) MG/3ML IN SOLN
3.0000 mL | Freq: Once | RESPIRATORY_TRACT | Status: AC
  Administered 2023-08-03: 3 mL via RESPIRATORY_TRACT
  Filled 2023-08-03: qty 3

## 2023-08-03 MED ORDER — FENTANYL CITRATE PF 50 MCG/ML IJ SOSY
50.0000 ug | PREFILLED_SYRINGE | Freq: Once | INTRAMUSCULAR | Status: AC
  Administered 2023-08-03: 50 ug via INTRAVENOUS
  Filled 2023-08-03: qty 1

## 2023-08-03 NOTE — ED Provider Notes (Signed)
[facility] Provider Note    Event Date/Time   First MD Initiated Contact with Patient 08/03/23 0142     (approximate)   History   Chest Pain   HPI  Lawrence [name] is a 67 y.o. male with history of CAD, A-fib, CHF, COPD, hypertension, diabetes who presents to the emergency department with chest pain.  States it is left-sided going down the left arm.  States this is similar to his chronic chest pain.  Has had shortness of breath, nausea.  No vomiting.  No fever.  Reports he has chronic cough that is unchanged.  No lower extremity swelling or pain.   History provided by patient.    Past Medical History:  Diagnosis Date   A-fib (HCC)    Anemia    Anginal pain (HCC)    Anxiety    Arthritis    Asthma    CAD (coronary artery disease)    a. 2002 CABGx2 (LIMA->LAD, VG->VG->OM1);  b. 09/2012 DES->OM;  c. 03/2015 PTCA of LAD [facility]) in setting of atretic LIMA; d. 05/2015 Cath [facility]): nonobs dzs; e. 06/2015 Cath ([facility]): LM nl, LAD 45p/d ISR, 50d, D1/2 small, LCX 50p/d ISR, OM1 70ost, 30 ISR, VG->OM1 50ost, 84m, LIMA->LAD 99p/d - atretic, RCA dom, nl; f.cath 10/16: 40-50%(FFR 0.90) pLAD, 75% (FFR 0.77) mLAD s/p PCI/DES, oRCA 40% (FFR0.95)   Cancer (HCC)    SKIN CANCER ON BACK   Celiac disease    Chronic diastolic CHF (congestive heart failure) (HCC)    a. 06/2009 Echo: EF 60-65%, Gr 1 DD, triv AI, mildly dil LA, nl RV.   COPD (chronic obstructive pulmonary disease) (HCC)    a. Chronic bronchitis and emphysema.   DDD (degenerative disc disease), lumbar    Diverticulosis    Dysrhythmia    Essential hypertension    GERD (gastroesophageal reflux disease)    History of hiatal hernia    History of kidney stones    H/O   History of tobacco abuse    a. Quit 2014.   Myocardial infarction [facility]) 2002   4 STENTS   Pancreatitis    PSVT (paroxysmal supraventricular tachycardia)    a. 10/2012 Noted on Zio Patch.   Sleep apnea    LOST CORD TO CPAP -ONLY 02 @ BEDTIME    Tubular adenoma of colon    Type II diabetes mellitus (HCC)     Past Surgical History:  Procedure Laterality Date   BYPASS GRAFT     CARDIAC CATHETERIZATION N/A 07/12/2015   rocedure: Left Heart Cath and Cors/Grafts Angiography;  Surgeon: Lyn Records, MD;  Location: [facility] INVASIVE CV LAB;  Service: Cardiovascular;  Laterality: N/A;   CARDIAC CATHETERIZATION Right 10/07/2015   Procedure: Left Heart Cath and Cors/Grafts Angiography;  Surgeon: Laurier Nancy, MD;  Location: [facility] INVASIVE CV LAB;  Service: Cardiovascular;  Laterality: Right;   CARDIAC CATHETERIZATION N/A 04/06/2016   Procedure: Left Heart Cath and Coronary Angiography;  Surgeon: Alwyn Pea, MD;  Location: [facility] INVASIVE CV LAB;  Service: Cardiovascular;  Laterality: N/A;   CARDIAC CATHETERIZATION  04/06/2016   Procedure: Bypass Graft Angiography;  Surgeon: Alwyn Pea, MD;  Location: [facility] INVASIVE CV LAB;  Service: Cardiovascular;;   CARDIAC CATHETERIZATION N/A 11/02/2016   Procedure: Left Heart Cath and Cors/Grafts Angiography and possible PCI;  Surgeon: Alwyn Pea, MD;  Location: [facility] INVASIVE CV LAB;  Service: Cardiovascular;  Laterality: N/A;   CARDIAC CATHETERIZATION N/A 11/02/2016   Procedure: Coronary Stent Intervention;  Surgeon: Alwyn Pea, MD;  Location: [facility] INVASIVE CV LAB;  Service: Cardiovascular;  Laterality: N/A;   CARDIOVERSION N/A 06/29/2022   Procedure: CARDIOVERSION;  Surgeon: Lamar Blinks, MD;  Location: [facility] ORS;  Service: Cardiovascular;  Laterality: N/A;   CHOLECYSTECTOMY     CIRCUMCISION N/A 06/09/2019   Procedure: CIRCUMCISION ADULT;  Surgeon: Sondra Come, MD;  Location: [facility] ORS;  Service: Urology;  Laterality: N/A;   COLONOSCOPY WITH PROPOFOL N/A 04/01/2018   Procedure: COLONOSCOPY WITH PROPOFOL;  Surgeon: Scot Jun, MD;  Location: [facility] ENDOSCOPY;  Service: Endoscopy;  Laterality: N/A;   COLONOSCOPY WITH PROPOFOL N/A 05/01/2023   Procedure: COLONOSCOPY WITH  PROPOFOL;  Surgeon: Toney Reil, MD;  Location: [facility] ENDOSCOPY;  Service: Gastroenterology;  Laterality: N/A;   ESOPHAGEAL DILATION     ESOPHAGOGASTRODUODENOSCOPY (EGD) WITH PROPOFOL N/A 04/01/2018   Procedure: ESOPHAGOGASTRODUODENOSCOPY (EGD) WITH PROPOFOL;  Surgeon: Scot Jun, MD;  Location: [facility] ENDOSCOPY;  Service: Endoscopy;  Laterality: N/A;   ESOPHAGOGASTRODUODENOSCOPY (EGD) WITH PROPOFOL N/A 05/01/2023   Procedure: ESOPHAGOGASTRODUODENOSCOPY (EGD) WITH PROPOFOL;  Surgeon: Toney Reil, MD;  Location: [facility] ENDOSCOPY;  Service: Gastroenterology;  Laterality: N/A;   GIVENS CAPSULE STUDY  05/01/2023   Procedure: GIVENS CAPSULE STUDY;  Surgeon: Toney Reil, MD;  Location: [facility] ENDOSCOPY;  Service: Gastroenterology;;   LEFT HEART CATH AND CORS/GRAFTS ANGIOGRAPHY N/A 06/12/2019   Procedure: LEFT HEART CATH AND CORS/GRAFTS ANGIOGRAPHY;  Surgeon: Dalia Heading, MD;  Location: [facility] INVASIVE CV LAB;  Service: Cardiovascular;  Laterality: N/A;   LEFT HEART CATH AND CORS/GRAFTS ANGIOGRAPHY N/A 03/11/2020   Procedure: LEFT HEART CATH AND CORS/GRAFTS ANGIOGRAPHY;  Surgeon: Marcina Millard, MD;  Location: [facility] INVASIVE CV LAB;  Service: Cardiovascular;  Laterality: N/A;   LEFT HEART CATH AND CORS/GRAFTS ANGIOGRAPHY N/A 05/01/2021   Procedure: LEFT HEART CATH AND CORS/GRAFTS ANGIOGRAPHY;  Surgeon: Lamar Blinks, MD;  Location: [facility] INVASIVE CV LAB;  Service: Cardiovascular;  Laterality: N/A;   LEFT HEART CATH AND CORS/GRAFTS ANGIOGRAPHY N/A 11/02/2022   Procedure: LEFT HEART CATH AND CORS/GRAFTS ANGIOGRAPHY;  Surgeon: Alwyn Pea, MD;  Location: [facility] INVASIVE CV LAB;  Service: Cardiovascular;  Laterality: N/A;   TONSILLECTOMY     VASCULAR SURGERY      MEDICATIONS:  Prior to Admission medications   Medication Sig Start Date End Date Taking? Authorizing Provider  Accu-Chek Softclix Lancets lancets Use as instructed to check sugar daily for type 2 diabetes.  03/03/21   Malva Limes, MD  acetaminophen (TYLENOL) 325 MG tablet Take 2 tablets (650 mg total) by mouth every 4 (four) hours as needed for headache or mild pain. 08/07/22   Rolly Salter, MD  albuterol (VENTOLIN HFA) 108 (90 Base) MCG/ACT inhaler INHALE 2 PUFFS BY MOUTH EVERY 6 HOURS AS NEEDED FOR SHORTNESS OF BREATH 12/02/21   Malva Limes, MD  allopurinol (ZYLOPRIM) 300 MG tablet TAKE 1 TABLET BY MOUTH TWICE A DAY 03/13/23   Malva Limes, MD  ALPRAZolam Prudy Feeler) 1 MG tablet Take 1 tablet (1 mg total) by mouth 3 (three) times daily as needed. 07/15/23   Malva Limes, MD  amiodarone (PACERONE) 200 MG tablet Take 1 tablet (200 mg total) by mouth daily. 05/04/23   Enedina Finner, MD  apixaban (ELIQUIS) 5 MG TABS tablet Take 1 tablet (5 mg total) by mouth 2 (two) times daily. 07/21/23   Jacky Kindle, FNP  atorvastatin (LIPITOR) 80 MG tablet TAKE 1 TABLET BY MOUTH AT BEDTIME 07/15/23  Malva Limes, MD  Blood Glucose Calibration (ACCU-CHEK GUIDE CONTROL) LIQD Use with blood glucose monitor as directed 02/20/21   Malva Limes, MD  Blood Glucose Monitoring Suppl (ACCU-CHEK GUIDE) w/Device KIT Use to check blood sugars as directed 02/20/21   Malva Limes, MD  Budeson-Glycopyrrol-Formoterol (BREZTRI AEROSPHERE) 160-9-4.8 MCG/ACT AERO Inhale 2 puffs into the lungs 2 (two) times daily. 06/02/23   Malva Limes, MD  celecoxib (CELEBREX) 200 MG capsule Take 1 capsule (200 mg total) by mouth 2 (two) times daily as needed. Home med. 11/03/22   [name], MD  cetirizine (ZYRTEC) 10 MG tablet TAKE 1 TABLET BY MOUTH AT BEDTIME 03/12/23   Malva Limes, MD  clopidogrel (PLAVIX) 75 MG tablet Take 1 tablet (75 mg total) by mouth daily. 07/21/23   Jacky Kindle, FNP  dicyclomine (BENTYL) 10 MG capsule Take 1 capsule (10 mg total) by mouth 3 (three) times daily as needed (abdominal pain). 05/14/23   Phineas Semen, MD  FARXIGA 10 MG TABS tablet Take 10 mg by mouth daily. 05/25/23   [provider]  glipiZIDE (GLUCOTROL) 5 MG tablet Take 1 tablet (5 mg total) by mouth daily as needed (sugar over 300). 03/16/23   Malva Limes, MD  glucose blood (ACCU-CHEK GUIDE) test strip Use as instructed to check sugar daily for type 2 diabetes. 03/03/21   Malva Limes, MD  iron polysaccharides (NIFEREX) 150 MG capsule Take 1 capsule (150 mg total) by mouth daily. 05/04/23   Enedina Finner, MD  isosorbide mononitrate (IMDUR) 120 MG 24 hr tablet Take 1 tablet (120 mg total) by mouth daily. 07/21/23   Jacky Kindle, FNP  lisinopril (ZESTRIL) 20 MG tablet Take 20 mg by mouth daily. 03/17/23   [provider]  meclizine (ANTIVERT) 25 MG tablet TAKE ONE TABLET BY THREE TIMES A DAY AS NEEDED FOR DIZZINESS 08/13/22   Malva Limes, MD  metFORMIN (GLUCOPHAGE-XR) 500 MG 24 hr tablet Take 1 tablet (500 mg total) by mouth 2 (two) times daily. 07/21/23   Jacky Kindle, FNP  methocarbamol (ROBAXIN) 500 MG tablet TAKE 1 TABLET BY MOUTH EVERY 8 HOURS AS NEEDED FOR MUSCLE SPASMS 07/15/23   Malva Limes, MD  metoprolol succinate (TOPROL-XL) 25 MG 24 hr tablet Take 0.5 tablets (12.5 mg total) by mouth daily. 07/21/23   Jacky Kindle, FNP  nitroGLYCERIN (NITROSTAT) 0.4 MG SL tablet Place 1 tablet (0.4 mg total) under the tongue every 5 (five) minutes as needed for chest pain. 09/10/22   Alfredia Ferguson, PA-C  omega-3 acid ethyl esters (LOVAZA) 1 g capsule TAKE 4 CAPSULES (4 GRAMS TOTAL) BY MOUTHDAILY 07/09/23   Malva Limes, MD  ondansetron (ZOFRAN) 4 MG tablet Take 1 tablet (4 mg total) by mouth every 8 (eight) hours as needed. 05/14/23   Phineas Semen, MD  oxyCODONE-acetaminophen (PERCOCET) 10-325 MG tablet TAKE 1 TABLET BY MOUTH EVERY 4 HOURS AS NEEDED FOR PAIN 07/15/23   Malva Limes, MD  pantoprazole (PROTONIX) 40 MG tablet Take 1 tablet (40 mg total) by mouth 2 (two) times daily for 14 days. 06/08/23 07/12/23  Celso Amy, PA-C  ranolazine (RANEXA) 1000 MG SR tablet Take 1 tablet (1,000 mg total) by  mouth 2 (two) times daily. 02/22/23   Arnetha Courser, MD  torsemide (DEMADEX) 100 MG tablet Take 0.5 tablets (50 mg total) by mouth daily. 09/10/22   Alfredia Ferguson, PA-C  traZODone (DESYREL) 150 MG tablet TAKE 1 TABLET BY  MOUTH AT BEDTIME 11/24/22   Malva Limes, MD  venlafaxine XR (EFFEXOR-XR) 75 MG 24 hr capsule TAKE 1 CAPSULE BY MOUTH DAILY WITH BREAKFAST 02/23/23   Malva Limes, MD    Physical Exam   Triage Vital Signs: ED Triage Vitals  Encounter Vitals Group     BP 08/02/23 2357 (!) 160/83     Systolic BP Percentile --      Diastolic BP Percentile --      Pulse Rate 08/02/23 2357 (!) 58     Resp 08/02/23 2357 (!) 22     Temp 08/02/23 2357 98.5 F (36.9 C)     Temp Source 08/02/23 2357 Oral     SpO2 08/02/23 2357 98 %     Weight 08/02/23 2357 190 lb (86.2 kg)     Height 08/02/23 2357 5\' 7"  (1.702 m)     Head Circumference --      Peak Flow --      Pain Score 08/02/23 2339 9     Pain Loc --      Pain Education --      Exclude from Growth Chart --     Most recent vital signs: Vitals:   08/02/23 2357 08/03/23 0201  BP: (!) 160/83 (!) 157/78  Pulse: (!) 58 (!) 56  Resp: (!) 22 18  Temp: 98.5 F (36.9 C) 98.4 F (36.9 C)  SpO2: 98% 100%    CONSTITUTIONAL: Alert, responds appropriately to questions.  Chronically ill-appearing HEAD: Normocephalic, atraumatic EYES: Conjunctivae clear, pupils appear equal, sclera nonicteric ENT: normal nose; moist mucous membranes NECK: Supple, normal ROM CARD: RRR; S1 and S2 appreciated RESP: Normal chest excursion without splinting or tachypnea; breath sounds equal bilaterally, scattered expiratory wheezes, no rhonchi or rales, no hypoxia on his normal oxygen ABD/GI: Non-distended; soft, non-tender, no rebound, no guarding, no peritoneal signs BACK: The back appears normal EXT: Normal ROM in all joints; no deformity noted, no edema, no calf tenderness or calf swelling SKIN: Normal color for age and race; warm; no rash on exposed  skin NEURO: Moves all extremities equally, normal speech PSYCH: The patient's mood and manner are appropriate.   ED Results / Procedures / Treatments   LABS: (all labs ordered are listed, but only abnormal results are displayed) Labs Reviewed  CBC WITH DIFFERENTIAL/PLATELET - Abnormal; Notable for the following components:      Result Value   RDW 16.4 (*)    All other components within normal limits  COMPREHENSIVE METABOLIC PANEL - Abnormal; Notable for the following components:   Glucose, Bld 100 (*)    All other components within normal limits  TROPONIN I (HIGH SENSITIVITY) - Abnormal; Notable for the following components:   Troponin I (High Sensitivity) 27 (*)    All other components within normal limits  TROPONIN I (HIGH SENSITIVITY) - Abnormal; Notable for the following components:   Troponin I (High Sensitivity) 20 (*)    All other components within normal limits     EKG:  EKG Interpretation Date/Time:  Monday August 02 2023 23:49:15 EDT Ventricular Rate:  59 PR Interval:  184 QRS Duration:  94 QT Interval:  478 QTC Calculation: 473 R Axis:   52  Text Interpretation: Sinus bradycardia Possible Inferior infarct (cited on or before 15-Jun-2023) Anterior infarct (cited on or before 15-Jun-2023) Abnormal ECG When compared with ECG of 21-Jul-2023 16:32, QT has lengthened Confirmed by Rochele Raring [id number]) on 08/03/2023 1:43:57 AM         RADIOLOGY:  My personal review and interpretation of imaging: Chest x-ray clear.  I have personally reviewed all radiology reports.   DG Chest 2 View  Result Date: 08/03/2023 CLINICAL DATA:  Chest pain EXAM: CHEST - 2 VIEW COMPARISON:  07/21/2023 FINDINGS: Lungs are clear.  No pleural effusion or pneumothorax. The heart is normal in size. Postsurgical changes related to prior CABG. Median sternotomy.  Mild degenerative changes of the thoracic spine. IMPRESSION: Normal chest radiographs. Electronically Signed   By: Charline Bills  M.D.   On: 08/03/2023 00:35     PROCEDURES:  Critical Care performed: No   CRITICAL CARE Performed by: Baxter Hire Khamia Stambaugh   Total critical care time: 0 minutes  Critical care time was exclusive of separately billable procedures and treating other patients.  Critical care was necessary to treat or prevent imminent or life-threatening deterioration.  Critical care was time spent personally by me on the following activities: development of treatment plan with patient and/or surrogate as well as nursing, discussions with consultants, evaluation of patient's response to treatment, examination of patient, obtaining history from patient or surrogate, ordering and performing treatments and interventions, ordering and review of laboratory studies, ordering and review of radiographic studies, pulse oximetry and re-evaluation of patient's condition.   Marland Kitchen1-3 Lead EKG Interpretation  Performed by: Coila Wardell, Layla Maw, DO Authorized by: Iretha Kirley, Layla Maw, DO     Interpretation: abnormal     ECG rate:  56   ECG rate assessment: bradycardic     Rhythm: sinus bradycardia     Ectopy: none     Conduction: normal       IMPRESSION / MDM / ASSESSMENT AND PLAN / ED COURSE  I reviewed the triage vital signs and the nursing notes.    Patient here for chronic chest pain.  Well-known to our emergency department with 22 visits in the past 6 months.  The patient is on the cardiac monitor to evaluate for evidence of arrhythmia and/or significant heart rate changes.   DIFFERENTIAL DIAGNOSIS (includes but not limited to):   Chronic pain, ACS, CHF, COPD, PE, doubt dissection   Patient's presentation is most consistent with acute presentation with potential threat to life or bodily function.   PLAN: Will obtain cardiac labs, chest x-ray.  EKG nonischemic.  Patient usually requests narcotic pain medication for his chest pain.  He does have some wheezing.  Will give DuoNeb.   MEDICATIONS GIVEN IN  ED: Medications  oxyCODONE-acetaminophen (PERCOCET/ROXICET) 5-325 MG per tablet 1 tablet (has no administration in time range)  fentaNYL (SUBLIMAZE) injection 50 mcg (50 mcg Intravenous Given 08/03/23 0211)  ondansetron (ZOFRAN) injection 4 mg (4 mg Intravenous Given 08/03/23 0211)  ipratropium-albuterol (DUONEB) 0.5-2.5 (3) MG/3ML nebulizer solution 3 mL (3 mLs Nebulization Given 08/03/23 0212)     ED COURSE: Troponin x 2 is flat and at his baseline.  Normal hemoglobin, normal electrolytes and renal function.  Chest x-ray reviewed and interpreted by myself and the radiologist and shows no acute abnormality.  Patient reports some improvement in pain after fentanyl.  Requesting more pain medication.  Will give Percocet.  I feel he is safe to be discharged and follow-up with his outpatient providers.  Lungs are now clear to auscultation.   At this time, I do not feel there is any life-threatening condition present. I reviewed all nursing notes, vitals, pertinent previous records.  All lab and urine results, EKGs, imaging ordered have been independently reviewed and interpreted by myself.  I reviewed all available radiology reports  from any imaging ordered this visit.  Based on my assessment, I feel the patient is safe to be discharged home without further emergent workup and can continue workup as an outpatient as needed. Discussed all findings, treatment plan as well as usual and customary return precautions.  They verbalize understanding and are comfortable with this plan.  Outpatient follow-up has been provided as needed.  All questions have been answered.    CONSULTS:  none   OUTSIDE RECORDS REVIEWED: Reviewed multiple previous cardiology notes.       FINAL CLINICAL IMPRESSION(S) / ED DIAGNOSES   Final diagnoses:  Chronic chest pain     Rx / DC Orders   ED Discharge Orders     None        Note:  This document was prepared using Dragon voice recognition software and may  include unintentional dictation errors.   Hasani Diemer, Layla Maw, DO 08/03/23 [phone]

## 2023-08-03 NOTE — ED Notes (Signed)
ED Provider at bedside. 

## 2023-08-04 ENCOUNTER — Emergency Department: Payer: Medicare HMO

## 2023-08-04 ENCOUNTER — Observation Stay
Admission: EM | Admit: 2023-08-04 | Discharge: 2023-08-05 | Disposition: A | Discharge status: Home / Self Care | Payer: Medicare HMO | Attending: Internal Medicine | Admitting: Internal Medicine

## 2023-08-04 ENCOUNTER — Telehealth: Payer: Self-pay

## 2023-08-04 ENCOUNTER — Other Ambulatory Visit: Payer: Self-pay

## 2023-08-04 ENCOUNTER — Telehealth: Payer: Self-pay | Admitting: Family Medicine

## 2023-08-04 DIAGNOSIS — E119 Type 2 diabetes mellitus without complications: Secondary | ICD-10-CM | POA: Diagnosis not present

## 2023-08-04 DIAGNOSIS — Z951 Presence of aortocoronary bypass graft: Secondary | ICD-10-CM | POA: Diagnosis not present

## 2023-08-04 DIAGNOSIS — F419 Anxiety disorder, unspecified: Secondary | ICD-10-CM

## 2023-08-04 DIAGNOSIS — J449 Chronic obstructive pulmonary disease, unspecified: Secondary | ICD-10-CM | POA: Insufficient documentation

## 2023-08-04 DIAGNOSIS — R42 Dizziness and giddiness: Secondary | ICD-10-CM | POA: Diagnosis not present

## 2023-08-04 DIAGNOSIS — I110 Hypertensive heart disease with heart failure: Secondary | ICD-10-CM | POA: Insufficient documentation

## 2023-08-04 DIAGNOSIS — F418 Other specified anxiety disorders: Secondary | ICD-10-CM | POA: Diagnosis present

## 2023-08-04 DIAGNOSIS — Z87891 Personal history of nicotine dependence: Secondary | ICD-10-CM | POA: Insufficient documentation

## 2023-08-04 DIAGNOSIS — Z7902 Long term (current) use of antithrombotics/antiplatelets: Secondary | ICD-10-CM | POA: Insufficient documentation

## 2023-08-04 DIAGNOSIS — I2510 Atherosclerotic heart disease of native coronary artery without angina pectoris: Secondary | ICD-10-CM | POA: Diagnosis not present

## 2023-08-04 DIAGNOSIS — I5022 Chronic systolic (congestive) heart failure: Secondary | ICD-10-CM | POA: Diagnosis present

## 2023-08-04 DIAGNOSIS — Z85828 Personal history of other malignant neoplasm of skin: Secondary | ICD-10-CM | POA: Insufficient documentation

## 2023-08-04 DIAGNOSIS — I10 Essential (primary) hypertension: Secondary | ICD-10-CM | POA: Diagnosis not present

## 2023-08-04 DIAGNOSIS — R0789 Other chest pain: Secondary | ICD-10-CM | POA: Diagnosis not present

## 2023-08-04 DIAGNOSIS — Z7901 Long term (current) use of anticoagulants: Secondary | ICD-10-CM | POA: Insufficient documentation

## 2023-08-04 DIAGNOSIS — I5032 Chronic diastolic (congestive) heart failure: Secondary | ICD-10-CM | POA: Insufficient documentation

## 2023-08-04 DIAGNOSIS — G4733 Obstructive sleep apnea (adult) (pediatric): Secondary | ICD-10-CM | POA: Diagnosis not present

## 2023-08-04 DIAGNOSIS — J45909 Unspecified asthma, uncomplicated: Secondary | ICD-10-CM | POA: Insufficient documentation

## 2023-08-04 DIAGNOSIS — I5042 Chronic combined systolic (congestive) and diastolic (congestive) heart failure: Secondary | ICD-10-CM | POA: Diagnosis present

## 2023-08-04 DIAGNOSIS — I480 Paroxysmal atrial fibrillation: Secondary | ICD-10-CM | POA: Diagnosis not present

## 2023-08-04 DIAGNOSIS — Z955 Presence of coronary angioplasty implant and graft: Secondary | ICD-10-CM | POA: Diagnosis not present

## 2023-08-04 DIAGNOSIS — R079 Chest pain, unspecified: Secondary | ICD-10-CM

## 2023-08-04 DIAGNOSIS — Z79899 Other long term (current) drug therapy: Secondary | ICD-10-CM | POA: Insufficient documentation

## 2023-08-04 DIAGNOSIS — E785 Hyperlipidemia, unspecified: Secondary | ICD-10-CM | POA: Diagnosis present

## 2023-08-04 DIAGNOSIS — E7801 Familial hypercholesterolemia: Secondary | ICD-10-CM | POA: Diagnosis not present

## 2023-08-04 DIAGNOSIS — E782 Mixed hyperlipidemia: Secondary | ICD-10-CM | POA: Diagnosis present

## 2023-08-04 DIAGNOSIS — I214 Non-ST elevation (NSTEMI) myocardial infarction: Principal | ICD-10-CM

## 2023-08-04 DIAGNOSIS — E1165 Type 2 diabetes mellitus with hyperglycemia: Secondary | ICD-10-CM

## 2023-08-04 DIAGNOSIS — R001 Bradycardia, unspecified: Secondary | ICD-10-CM | POA: Diagnosis not present

## 2023-08-04 DIAGNOSIS — R110 Nausea: Secondary | ICD-10-CM | POA: Diagnosis not present

## 2023-08-04 LAB — COMPREHENSIVE METABOLIC PANEL WITH GFR
ALT: 13 U/L (ref 0–44)
AST: 14 U/L — ABNORMAL LOW (ref 15–41)
Albumin: 3.5 g/dL (ref 3.5–5.0)
Alkaline Phosphatase: 57 U/L (ref 38–126)
Anion gap: 7 (ref 5–15)
BUN: 12 mg/dL (ref 8–23)
CO2: 25 mmol/L (ref 22–32)
Calcium: 8.9 mg/dL (ref 8.9–10.3)
Chloride: 108 mmol/L (ref 98–111)
Creatinine, Ser: 0.77 mg/dL (ref 0.61–1.24)
GFR, Estimated: >60 mL/min (ref >60)
Glucose, Bld: 158 mg/dL — ABNORMAL HIGH (ref 70–99)
Potassium: 3.8 mmol/L (ref 3.5–5.1)
Sodium: 140 mmol/L (ref 135–145)
Total Bilirubin: 0.7 mg/dL (ref 0.3–1.2)
Total Protein: 6.3 g/dL — ABNORMAL LOW (ref 6.5–8.1)

## 2023-08-04 LAB — HEPARIN LEVEL (UNFRACTIONATED): Heparin Unfractionated: >1.1 [IU]/mL — ABNORMAL HIGH (ref 0.30–0.70)

## 2023-08-04 LAB — CBC
HCT: 44.6 % (ref 39.0–52.0)
Hemoglobin: 13.6 g/dL (ref 13.0–17.0)
MCH: 28.9 pg (ref 26.0–34.0)
MCHC: 30.5 g/dL (ref 30.0–36.0)
MCV: 94.9 fL (ref 80.0–100.0)
Platelets: 149 10*3/uL — ABNORMAL LOW (ref 150–400)
RBC: 4.7 MIL/uL (ref 4.22–5.81)
RDW: 15.9 % — ABNORMAL HIGH (ref 11.5–15.5)
WBC: 6.1 10*3/uL (ref 4.0–10.5)
nRBC: 0 % (ref 0.0–0.2)

## 2023-08-04 LAB — PROTIME-INR
INR: 1.2 (ref 0.8–1.2)
Prothrombin Time: 14.9 s (ref 11.4–15.2)

## 2023-08-04 LAB — LIPID PANEL
Cholesterol: 120 mg/dL (ref 0–200)
HDL: 41 mg/dL (ref >40)
LDL Cholesterol: 60 mg/dL (ref 0–99)
Total CHOL/HDL Ratio: 2.9 ratio
Triglycerides: 95 mg/dL (ref <150)
VLDL: 19 mg/dL (ref 0–40)

## 2023-08-04 LAB — URINE DRUG SCREEN, QUALITATIVE (ARMC ONLY)
Amphetamines, Ur Screen: NOT DETECTED
Barbiturates, Ur Screen: NOT DETECTED
Benzodiazepine, Ur Scrn: POSITIVE — AB
Cannabinoid 50 Ng, Ur ~~LOC~~: NOT DETECTED
Cocaine Metabolite,Ur ~~LOC~~: NOT DETECTED
MDMA (Ecstasy)Ur Screen: NOT DETECTED
Methadone Scn, Ur: NOT DETECTED
Opiate, Ur Screen: NOT DETECTED
Phencyclidine (PCP) Ur S: NOT DETECTED
Tricyclic, Ur Screen: NOT DETECTED

## 2023-08-04 LAB — BRAIN NATRIURETIC PEPTIDE: B Natriuretic Peptide: 264.2 pg/mL — ABNORMAL HIGH (ref 0.0–100.0)

## 2023-08-04 LAB — TROPONIN I (HIGH SENSITIVITY)
Troponin I (High Sensitivity): 209 ng/L (ref <18)
Troponin I (High Sensitivity): 207 ng/L (ref <18)

## 2023-08-04 LAB — CBG MONITORING, ED: Glucose-Capillary: 120 mg/dL — ABNORMAL HIGH (ref 70–99)

## 2023-08-04 MED ORDER — INSULIN ASPART 100 UNIT/ML IJ SOLN: 0.0000 [IU] | Freq: Three times a day (TID) | INTRAMUSCULAR | Status: DC

## 2023-08-04 MED ORDER — HYDROMORPHONE HCL 1 MG/ML IJ SOLN
1.0000 mg | INTRAMUSCULAR | Status: DC | PRN
  Administered 2023-08-05 (×2): 1 mg via INTRAVENOUS
  Filled 2023-08-04 (×2): qty 1

## 2023-08-04 MED ORDER — HYDROMORPHONE HCL 1 MG/ML IJ SOLN
1.0000 mg | Freq: Once | INTRAMUSCULAR | Status: AC
  Administered 2023-08-04: 1 mg via INTRAVENOUS
  Filled 2023-08-04: qty 1

## 2023-08-04 MED ORDER — IPRATROPIUM BROMIDE 0.02 % IN SOLN: 2.5000 mL | RESPIRATORY_TRACT | Status: DC | PRN

## 2023-08-04 MED ORDER — ONDANSETRON HCL 4 MG/2ML IJ SOLN
4.0000 mg | Freq: Three times a day (TID) | INTRAMUSCULAR | Status: DC | PRN
  Administered 2023-08-05: 4 mg via INTRAVENOUS
  Filled 2023-08-04: qty 2

## 2023-08-04 MED ORDER — DM-GUAIFENESIN ER 30-600 MG PO TB12: 1.0000 | ORAL_TABLET | Freq: Two times a day (BID) | ORAL | Status: DC | PRN

## 2023-08-04 MED ORDER — INSULIN ASPART 100 UNIT/ML IJ SOLN: 0.0000 [IU] | Freq: Every day | INTRAMUSCULAR | Status: DC

## 2023-08-04 MED ORDER — ONDANSETRON HCL 4 MG/2ML IJ SOLN
4.0000 mg | Freq: Once | INTRAMUSCULAR | Status: AC
  Administered 2023-08-04: 4 mg via INTRAVENOUS
  Filled 2023-08-04: qty 2

## 2023-08-04 MED ORDER — HEPARIN (PORCINE) 25000 UT/250ML-% IV SOLN
1100.0000 [IU]/h | INTRAVENOUS | Status: DC
  Administered 2023-08-05: 1100 [IU]/h via INTRAVENOUS
  Filled 2023-08-04: qty 250

## 2023-08-04 MED ORDER — NITROGLYCERIN 0.4 MG SL SUBL: 0.4000 mg | SUBLINGUAL_TABLET | SUBLINGUAL | Status: DC | PRN

## 2023-08-04 MED ORDER — HYDRALAZINE HCL 20 MG/ML IJ SOLN: 5.0000 mg | INTRAMUSCULAR | Status: DC | PRN

## 2023-08-04 MED ORDER — ACETAMINOPHEN 325 MG PO TABS: 650.0000 mg | ORAL_TABLET | Freq: Four times a day (QID) | ORAL | Status: DC | PRN

## 2023-08-04 MED ORDER — OXYCODONE-ACETAMINOPHEN 5-325 MG PO TABS
1.0000 | ORAL_TABLET | ORAL | Status: DC | PRN
  Administered 2023-08-04: 1 via ORAL
  Filled 2023-08-04: qty 1

## 2023-08-04 NOTE — ED Notes (Signed)
Lab reports troponin results--acuity level changed and charge nurse notified of exam room needed

## 2023-08-04 NOTE — H&P (Signed)
History and Physical    Lawrence [name]:[id number] DOB: 1955-12-26 DOA: 08/04/2023  Referring MD/NP/PA:   PCP: Malva Limes, MD   Patient coming from:  The patient is coming from home.     Chief Complaint: chest pain  HPI: Lawrence [name] is a 67 y.o. male with medical history significant of CAD (s/p of stent x 4 and CBAG 2016), a fib on Eliquis, PSVT,  HTN, HLD, DM, COPD on 2L O2, OSA not using CPAP, dCHF, anxiety, celiac disease, pancreatitis, who presents with chest pain.    Patient had chest pain yesterday and was seen in ED. He had mild troponin elevation 20. Pt was discharged home after his chest pain improved.  He started having chest pain again this morning, which is located in substernal area, initially 9 out of 10 in severity, currently 7 out of 10 in severity, pressure-like, radiating to the left arm, not pleuritic, not aggravated by deep breath. Patient states he has chronic cough and shortness of breath due to COPD which has not changed.  No fever or chills. He nausea, no vomiting, diarrhea or abdominal pain.  No symptoms of UTI.   Data reviewed independently and ED Course: pt was found to have trop 20 (yesterdy) --> 209, GFR> 60, WBC 6.1, temperature normal, blood pressure 169/85, heart rate 52, RR 18, oxygen saturation 100% on room air.  Negative chest x-ray.  Patient is admitted to telemetry bed as inpatient.  Dr. Melton Alar of card is consulted.  EKG: I have personally reviewed.  Sinus rhythm, QTc 428, LAE, Q-wave in inferior leads, poor R wave progression.   Review of Systems:   General: no fevers, chills, no body weight gain, has fatigue HEENT: no blurry vision, hearing changes or sore throat Respiratory: has dyspnea, coughing, no wheezing CV: has chest pain, no palpitations GI: has nausea, no vomiting, abdominal pain, diarrhea, constipation GU: no dysuria, burning on urination, increased urinary frequency, hematuria  Ext: no leg edema Neuro: no unilateral  weakness, numbness, or tingling, no vision change or hearing loss Skin: no rash, no skin tear. MSK: No muscle spasm, no deformity, no limitation of range of movement in spin Heme: No easy bruising.  Travel history: No recent long distant travel.   Allergy:  Allergies  Allergen Reactions   Prednisone Other (See Comments) and Hypertension    Pt states that this medication puts him in A-fib    Demerol  [Meperidine Hcl]    Demerol [Meperidine] Hives   Jardiance [Empagliflozin] Other (See Comments)    Perineal pain   Sulfa Antibiotics Hives   Albuterol Sulfate [Albuterol] Palpitations and Other (See Comments)    Pt currently uses this medication.     Morphine Sulfate Nausea And Vomiting, Rash and Other (See Comments)    Pt states that he is only allergic to the tablet form of this medication.      Past Medical History:  Diagnosis Date   A-fib (HCC)    Anemia    Anginal pain (HCC)    Anxiety    Arthritis    Asthma    CAD (coronary artery disease)    a. 2002 CABGx2 (LIMA->LAD, VG->VG->OM1);  b. 09/2012 DES->OM;  c. 03/2015 PTCA of LAD [facility]) in setting of atretic LIMA; d. 05/2015 Cath [facility]): nonobs dzs; e. 06/2015 Cath ([facility]): LM nl, LAD 45p/d ISR, 50d, D1/2 small, LCX 50p/d ISR, OM1 70ost, 30 ISR, VG->OM1 50ost, 93m, LIMA->LAD 99p/d - atretic, RCA dom, nl; f.cath 10/16: 40-50%(FFR 0.90)  pLAD, 75% (FFR 0.77) mLAD s/p PCI/DES, oRCA 40% (FFR0.95)   Cancer (HCC)    SKIN CANCER ON BACK   Celiac disease    Chronic diastolic CHF (congestive heart failure) (HCC)    a. 06/2009 Echo: EF 60-65%, Gr 1 DD, triv AI, mildly dil LA, nl RV.   COPD (chronic obstructive pulmonary disease) (HCC)    a. Chronic bronchitis and emphysema.   DDD (degenerative disc disease), lumbar    Diverticulosis    Dysrhythmia    Essential hypertension    GERD (gastroesophageal reflux disease)    History of hiatal hernia    History of kidney stones    H/O   History of tobacco abuse    a. Quit 2014.   Myocardial  infarction [facility]) 2002   4 STENTS   Pancreatitis    PSVT (paroxysmal supraventricular tachycardia)    a. 10/2012 Noted on Zio Patch.   Sleep apnea    LOST CORD TO CPAP -ONLY 02 @ BEDTIME   Tubular adenoma of colon    Type II diabetes mellitus (HCC)     Past Surgical History:  Procedure Laterality Date   BYPASS GRAFT     CARDIAC CATHETERIZATION N/A 07/12/2015   rocedure: Left Heart Cath and Cors/Grafts Angiography;  Surgeon: Lyn Records, MD;  Location: [facility] INVASIVE CV LAB;  Service: Cardiovascular;  Laterality: N/A;   CARDIAC CATHETERIZATION Right 10/07/2015   Procedure: Left Heart Cath and Cors/Grafts Angiography;  Surgeon: Laurier Nancy, MD;  Location: [facility] INVASIVE CV LAB;  Service: Cardiovascular;  Laterality: Right;   CARDIAC CATHETERIZATION N/A 04/06/2016   Procedure: Left Heart Cath and Coronary Angiography;  Surgeon: Alwyn Pea, MD;  Location: [facility] INVASIVE CV LAB;  Service: Cardiovascular;  Laterality: N/A;   CARDIAC CATHETERIZATION  04/06/2016   Procedure: Bypass Graft Angiography;  Surgeon: Alwyn Pea, MD;  Location: [facility] INVASIVE CV LAB;  Service: Cardiovascular;;   CARDIAC CATHETERIZATION N/A 11/02/2016   Procedure: Left Heart Cath and Cors/Grafts Angiography and possible PCI;  Surgeon: Alwyn Pea, MD;  Location: [facility] INVASIVE CV LAB;  Service: Cardiovascular;  Laterality: N/A;   CARDIAC CATHETERIZATION N/A 11/02/2016   Procedure: Coronary Stent Intervention;  Surgeon: Alwyn Pea, MD;  Location: [facility] INVASIVE CV LAB;  Service: Cardiovascular;  Laterality: N/A;   CARDIOVERSION N/A 06/29/2022   Procedure: CARDIOVERSION;  Surgeon: Lamar Blinks, MD;  Location: [facility] ORS;  Service: Cardiovascular;  Laterality: N/A;   CHOLECYSTECTOMY     CIRCUMCISION N/A 06/09/2019   Procedure: CIRCUMCISION ADULT;  Surgeon: Sondra Come, MD;  Location: [facility] ORS;  Service: Urology;  Laterality: N/A;   COLONOSCOPY WITH PROPOFOL N/A 04/01/2018   Procedure: COLONOSCOPY  WITH PROPOFOL;  Surgeon: Scot Jun, MD;  Location: [facility] ENDOSCOPY;  Service: Endoscopy;  Laterality: N/A;   COLONOSCOPY WITH PROPOFOL N/A 05/01/2023   Procedure: COLONOSCOPY WITH PROPOFOL;  Surgeon: Toney Reil, MD;  Location: [facility] ENDOSCOPY;  Service: Gastroenterology;  Laterality: N/A;   ESOPHAGEAL DILATION     ESOPHAGOGASTRODUODENOSCOPY (EGD) WITH PROPOFOL N/A 04/01/2018   Procedure: ESOPHAGOGASTRODUODENOSCOPY (EGD) WITH PROPOFOL;  Surgeon: Scot Jun, MD;  Location: [facility] ENDOSCOPY;  Service: Endoscopy;  Laterality: N/A;   ESOPHAGOGASTRODUODENOSCOPY (EGD) WITH PROPOFOL N/A 05/01/2023   Procedure: ESOPHAGOGASTRODUODENOSCOPY (EGD) WITH PROPOFOL;  Surgeon: Toney Reil, MD;  Location: [facility] ENDOSCOPY;  Service: Gastroenterology;  Laterality: N/A;   GIVENS CAPSULE STUDY  05/01/2023   Procedure: GIVENS CAPSULE STUDY;  Surgeon: Toney Reil, MD;  Location: [facility] ENDOSCOPY;  Service: Gastroenterology;;   LEFT HEART CATH AND CORS/GRAFTS ANGIOGRAPHY N/A 06/12/2019   Procedure: LEFT HEART CATH AND CORS/GRAFTS ANGIOGRAPHY;  Surgeon: Dalia Heading, MD;  Location: [facility] INVASIVE CV LAB;  Service: Cardiovascular;  Laterality: N/A;   LEFT HEART CATH AND CORS/GRAFTS ANGIOGRAPHY N/A 03/11/2020   Procedure: LEFT HEART CATH AND CORS/GRAFTS ANGIOGRAPHY;  Surgeon: Marcina Millard, MD;  Location: [facility] INVASIVE CV LAB;  Service: Cardiovascular;  Laterality: N/A;   LEFT HEART CATH AND CORS/GRAFTS ANGIOGRAPHY N/A 05/01/2021   Procedure: LEFT HEART CATH AND CORS/GRAFTS ANGIOGRAPHY;  Surgeon: Lamar Blinks, MD;  Location: [facility] INVASIVE CV LAB;  Service: Cardiovascular;  Laterality: N/A;   LEFT HEART CATH AND CORS/GRAFTS ANGIOGRAPHY N/A 11/02/2022   Procedure: LEFT HEART CATH AND CORS/GRAFTS ANGIOGRAPHY;  Surgeon: Alwyn Pea, MD;  Location: [facility] INVASIVE CV LAB;  Service: Cardiovascular;  Laterality: N/A;   TONSILLECTOMY     VASCULAR SURGERY      Social History:  reports  that he quit smoking about 10 years ago. His smoking use included cigarettes. He started smoking about 60 years ago. He has a 150 pack-year smoking history. He has never used smokeless tobacco. He reports that he does not currently use drugs after having used the following drugs: Marijuana. He reports that he does not drink alcohol.  Family History:  Family History  Problem Relation Age of Onset   Heart attack Mother    Depression Mother    Heart disease Mother    COPD Mother    Hypertension Mother    Heart attack Father    Diabetes Father    Depression Father    Heart disease Father    Cirrhosis Father    Parkinson's disease Brother      Prior to Admission medications   Medication Sig Start Date End Date Taking? Authorizing Provider  amLODipine (NORVASC) 5 MG tablet Take 1 tablet by mouth daily. 07/29/23 08/28/23 Yes [provider]  apixaban (ELIQUIS) 5 MG TABS tablet Take 1 tablet (5 mg total) by mouth 2 (two) times daily. 07/21/23  Yes Jacky Kindle, FNP  doxycycline (VIBRA-TABS) 100 MG tablet Take 100 mg by mouth 2 (two) times daily. 07/22/23  Yes [provider]  Accu-Chek Softclix Lancets lancets Use as instructed to check sugar daily for type 2 diabetes. 03/03/21   Malva Limes, MD  acetaminophen (TYLENOL) 325 MG tablet Take 2 tablets (650 mg total) by mouth every 4 (four) hours as needed for headache or mild pain. 08/07/22   Rolly Salter, MD  albuterol (VENTOLIN HFA) 108 (90 Base) MCG/ACT inhaler INHALE 2 PUFFS BY MOUTH EVERY 6 HOURS AS NEEDED FOR SHORTNESS OF BREATH 12/02/21   Malva Limes, MD  allopurinol (ZYLOPRIM) 300 MG tablet TAKE 1 TABLET BY MOUTH TWICE A DAY 03/13/23   Malva Limes, MD  ALPRAZolam Prudy Feeler) 1 MG tablet Take 1 tablet (1 mg total) by mouth 3 (three) times daily as needed. 07/15/23   Malva Limes, MD  amiodarone (PACERONE) 200 MG tablet Take 1 tablet (200 mg total) by mouth daily. 05/04/23   Enedina Finner, MD  atorvastatin (LIPITOR)  80 MG tablet TAKE 1 TABLET BY MOUTH AT BEDTIME 07/15/23   Malva Limes, MD  Blood Glucose Calibration (ACCU-CHEK GUIDE CONTROL) LIQD Use with blood glucose monitor as directed 02/20/21   Malva Limes, MD  Blood Glucose Monitoring Suppl (ACCU-CHEK GUIDE) w/Device KIT Use to check blood sugars as directed 02/20/21   Malva Limes,  MD  Budeson-Glycopyrrol-Formoterol (BREZTRI AEROSPHERE) 160-9-4.8 MCG/ACT AERO Inhale 2 puffs into the lungs 2 (two) times daily. 06/02/23   Malva Limes, MD  celecoxib (CELEBREX) 200 MG capsule Take 1 capsule (200 mg total) by mouth 2 (two) times daily as needed. Home med. 11/03/22   [name], MD  cetirizine (ZYRTEC) 10 MG tablet TAKE 1 TABLET BY MOUTH AT BEDTIME 03/12/23   Malva Limes, MD  clopidogrel (PLAVIX) 75 MG tablet Take 1 tablet (75 mg total) by mouth daily. 07/21/23   Jacky Kindle, FNP  dicyclomine (BENTYL) 10 MG capsule Take 1 capsule (10 mg total) by mouth 3 (three) times daily as needed (abdominal pain). 05/14/23   Phineas Semen, MD  FARXIGA 10 MG TABS tablet Take 10 mg by mouth daily. 05/25/23   [provider]  glipiZIDE (GLUCOTROL) 5 MG tablet Take 1 tablet (5 mg total) by mouth daily as needed (sugar over 300). 03/16/23   Malva Limes, MD  glucose blood (ACCU-CHEK GUIDE) test strip Use as instructed to check sugar daily for type 2 diabetes. 03/03/21   Malva Limes, MD  iron polysaccharides (NIFEREX) 150 MG capsule Take 1 capsule (150 mg total) by mouth daily. 05/04/23   Enedina Finner, MD  isosorbide mononitrate (IMDUR) 120 MG 24 hr tablet Take 1 tablet (120 mg total) by mouth daily. 07/21/23   Jacky Kindle, FNP  lisinopril (ZESTRIL) 20 MG tablet Take 20 mg by mouth daily. 03/17/23   [provider]  meclizine (ANTIVERT) 25 MG tablet TAKE ONE TABLET BY THREE TIMES A DAY AS NEEDED FOR DIZZINESS 08/13/22   Malva Limes, MD  metFORMIN (GLUCOPHAGE-XR) 500 MG 24 hr tablet Take 1 tablet (500 mg total) by mouth 2 (two) times daily.  07/21/23   Jacky Kindle, FNP  methocarbamol (ROBAXIN) 500 MG tablet TAKE 1 TABLET BY MOUTH EVERY 8 HOURS AS NEEDED FOR MUSCLE SPASMS 07/15/23   Malva Limes, MD  metoprolol succinate (TOPROL-XL) 25 MG 24 hr tablet Take 0.5 tablets (12.5 mg total) by mouth daily. 07/21/23   Jacky Kindle, FNP  nitroGLYCERIN (NITROSTAT) 0.4 MG SL tablet Place 1 tablet (0.4 mg total) under the tongue every 5 (five) minutes as needed for chest pain. 09/10/22   Alfredia Ferguson, PA-C  omega-3 acid ethyl esters (LOVAZA) 1 g capsule TAKE 4 CAPSULES (4 GRAMS TOTAL) BY MOUTHDAILY 07/09/23   Malva Limes, MD  ondansetron (ZOFRAN) 4 MG tablet Take 1 tablet (4 mg total) by mouth every 8 (eight) hours as needed. 05/14/23   Phineas Semen, MD  oxyCODONE-acetaminophen (PERCOCET) 10-325 MG tablet TAKE 1 TABLET BY MOUTH EVERY 4 HOURS AS NEEDED FOR PAIN 07/15/23   Malva Limes, MD  pantoprazole (PROTONIX) 40 MG tablet Take 1 tablet (40 mg total) by mouth 2 (two) times daily for 14 days. 06/08/23 07/12/23  Celso Amy, PA-C  ranolazine (RANEXA) 1000 MG SR tablet Take 1 tablet (1,000 mg total) by mouth 2 (two) times daily. 02/22/23   Arnetha Courser, MD  torsemide (DEMADEX) 100 MG tablet Take 0.5 tablets (50 mg total) by mouth daily. 09/10/22   Alfredia Ferguson, PA-C  traZODone (DESYREL) 150 MG tablet TAKE 1 TABLET BY MOUTH AT BEDTIME 11/24/22   Malva Limes, MD  venlafaxine XR (EFFEXOR-XR) 75 MG 24 hr capsule TAKE 1 CAPSULE BY MOUTH DAILY WITH BREAKFAST 02/23/23   Malva Limes, MD    Physical Exam: Vitals:   08/04/23 2230 08/04/23 2300 08/04/23 2324 08/04/23  2330  BP: (!) 166/89 (!) 144/83  129/75  Pulse: (!) 49 (!) 55  (!) 52  Resp:  16  16  Temp:   98 F (36.7 C)   TempSrc:   Oral   SpO2: 100% 100%  99%  Weight:      Height:       General: Not in acute distress HEENT:       Eyes: PERRL, EOMI, no jaundice       ENT: No discharge from the ears and nose, no pharynx injection, no tonsillar enlargement.        Neck:  No JVD, no bruit, no mass felt. Heme: No neck lymph node enlargement. Cardiac: S1/S2, RRR, No murmurs, No gallops or rubs. Respiratory: No rales, wheezing, rhonchi or rubs. GI: Soft, nondistended, nontender, no rebound pain, no organomegaly, BS present. GU: No hematuria Ext: No pitting leg edema bilaterally. 1+DP/PT pulse bilaterally. Musculoskeletal: No joint deformities, No joint redness or warmth, no limitation of ROM in spin. Skin: No rashes.  Neuro: Alert, oriented X3, cranial nerves II-XII grossly intact, moves all extremities normally.  Psych: Patient is not psychotic, no suicidal or hemocidal ideation.  Labs on Admission: I have personally reviewed following labs and imaging studies  CBC: Recent Labs  Lab 08/02/23 2356 08/04/23 1944  WBC 7.2 6.1  NEUTROABS 5.1  --   HGB 14.4 13.6  HCT 45.6 44.6  MCV 91.9 94.9  PLT 177 149*   Basic Metabolic Panel: Recent Labs  Lab 08/02/23 2356 08/04/23 1944  NA 140 140  K 3.8 3.8  CL 100 108  CO2 28 25  GLUCOSE 100* 158*  BUN 22 12  CREATININE 1.10 0.77  CALCIUM 9.3 8.9   GFR: Estimated Creatinine Clearance: 91.3 mL/min (by C-G formula based on SCr of 0.77 mg/dL). Liver Function Tests: Recent Labs  Lab 08/02/23 2356 08/04/23 1944  AST 16 14*  ALT 15 13  ALKPHOS 64 57  BILITOT 0.8 0.7  PROT 6.7 6.3*  ALBUMIN 3.8 3.5   No results for input(s): "LIPASE", "AMYLASE" in the last 168 hours. No results for input(s): "AMMONIA" in the last 168 hours. Coagulation Profile: Recent Labs  Lab 08/04/23 1944  INR 1.2   Cardiac Enzymes: No results for input(s): "CKTOTAL", "CKMB", "CKMBINDEX", "TROPONINI" in the last 168 hours. BNP (last 3 results) No results for input(s): "PROBNP" in the last 8760 hours. HbA1C: No results for input(s): "HGBA1C" in the last 72 hours. CBG: Recent Labs  Lab 08/04/23 2119  GLUCAP 120*   Lipid Profile: Recent Labs    08/04/23 1944  CHOL 120  HDL 41  LDLCALC 60  TRIG 95  CHOLHDL 2.9    Thyroid Function Tests: No results for input(s): "TSH", "T4TOTAL", "FREET4", "T3FREE", "THYROIDAB" in the last 72 hours. Anemia Panel: No results for input(s): "VITAMINB12", "FOLATE", "FERRITIN", "TIBC", "IRON", "RETICCTPCT" in the last 72 hours. Urine analysis:    Component Value Date/Time   COLORURINE AMBER (A) 06/15/2023 2215   APPEARANCEUR HAZY (A) 06/15/2023 2215   APPEARANCEUR Clear 05/25/2019 1554   LABSPEC 1.030 06/15/2023 2215   LABSPEC 1.012 03/07/2015 2143   PHURINE 5.0 06/15/2023 2215   GLUCOSEU >=500 (A) 06/15/2023 2215   GLUCOSEU Negative 03/07/2015 2143   HGBUR NEGATIVE 06/15/2023 2215   BILIRUBINUR NEGATIVE 06/15/2023 2215   BILIRUBINUR Negative 05/25/2019 1554   BILIRUBINUR Negative 03/07/2015 2143   KETONESUR NEGATIVE 06/15/2023 2215   PROTEINUR 30 (A) 06/15/2023 2215   UROBILINOGEN 0.2 03/26/2017 0907   NITRITE  NEGATIVE 06/15/2023 2215   LEUKOCYTESUR NEGATIVE 06/15/2023 2215   LEUKOCYTESUR Negative 03/07/2015 2143   Sepsis Labs: @LABRCNTIP (procalcitonin:4,lacticidven:4) )No results found for this or any previous visit (from the past 240 hour(s)).   Radiological Exams on Admission: DG Chest 1 View  Result Date: 08/04/2023 CLINICAL DATA:  Central chest pain radiating to left arm, dizziness and nausea EXAM: CHEST  1 VIEW COMPARISON:  08/02/2023 FINDINGS: Single frontal view of the chest demonstrates an unremarkable cardiac silhouette. Postsurgical changes from median sternotomy. Implanted device within the left anterior chest wall unchanged. No acute bony abnormalities. IMPRESSION: 1. Stable chest, no acute intrathoracic process. Electronically Signed   By: Sharlet Salina M.D.   On: 08/04/2023 20:28      Assessment/Plan Active Problems:   NSTEMI (non-ST elevated myocardial infarction) (HCC)   CAD (coronary artery disease)   HLD (hyperlipidemia)   Chronic diastolic CHF (congestive heart failure) (HCC)   Paroxysmal atrial fibrillation (HCC)   Diabetes  mellitus without complication (HCC)   HTN (hypertension)   Depression with anxiety   Assessment and Plan:  NSTEMI (non-ST elevated myocardial infarction) (HCC) and hx of CAD: s/p of stent x 4 and CBAG. Trop 20 --> 209 --> 207 --> 258. Consulted Dr. Magda Kiel of card.  - admit to tele be as inpatient - IV heparin - Trend Trop - Repeat EKG in the am  - prn Nitroglycerin, dilaudid - Imdur and Ranexa - give ASA 324 mg now - lipitor  - Risk factor stratification: will check FLP and A1C  - check UDS  HLD (hyperlipidemia) -Lipitor  Chronic diastolic CHF (congestive heart failure) (HCC): 2D echo on 05/01/2023 showed EF of 50-55% with grade 3 diastolic dysfunction.  Patient does not have leg edema.  No oxygen desaturation or pulm edema on chest x-ray.  CHF seem to be compensated. -continue torsemide -BNP --> 264  Paroxysmal atrial fibrillation (HCC) -Switched Eliquis to IV heparin as above -Metoprolol, amiodarone  Diabetes mellitus without complication [facility]): Recent A1c 6.1, patient is on Farxiga, glipizide and metformin -Sliding scale insulin  HTN (hypertension) -IV hydralazine as needed -Metoprolol, torsemide, lisinopril  Anxiety and anxiety -Continue home medications      DVT ppx: on IV Heparin        Code Status: DNR (I discussed with patient and explained the meaning of CODE STATUS. Patient wants to be DNR)  Family Communication: I offered to call his family, but patient states that he already called his son.  He said I do not need to call his family.  Disposition Plan:  Anticipate discharge back to previous environment  Consults called:   Dr. Melton Alar of card is consulted  Admission status and Level of care: Telemetry Medical:    as inpt      Dispo: The patient is from: Home              Anticipated d/c is to: Home              Anticipated d/c date is: 2 days              Patient currently is not medically stable to d/c.    Severity of Illness:  The  appropriate patient status for this patient is INPATIENT. Inpatient status is judged to be reasonable and necessary in order to provide the required intensity of service to ensure the patient's safety. The patient's presenting symptoms, physical exam findings, and initial radiographic and laboratory data in the context of their chronic comorbidities is felt  to place them at high risk for further clinical deterioration. Furthermore, it is not anticipated that the patient will be medically stable for discharge from the hospital within 2 midnights of admission.   * I certify that at the point of admission it is my clinical judgment that the patient will require inpatient hospital care spanning beyond 2 midnights from the point of admission due to high intensity of service, high risk for further deterioration and high frequency of surveillance required.*       Date of Service 08/05/2023    Lorretta Harp [facility]   If 7PM-7AM, please contact night-coverage [email or web address] 08/05/2023, 1:20 AM

## 2023-08-04 NOTE — Telephone Encounter (Signed)
Patient called stated he needed a form faxed the the [employer] stating Lawrence [name] patients grandson would be caring for him. This letter can be faxed [phone]. Please f/u with patient

## 2023-08-04 NOTE — Telephone Encounter (Signed)
Copied from CRM [id number]. Topic: General - Inquiry >> Aug 04, 2023  3:12 PM De Blanch wrote: Reason for CRM: Pt stated he is a fall risk and needs Dr. Sherrie Mustache to write a letter saying he needs someone to stay with him. The letter is for [employer]. Has a family member that may be able to stay with him.  Please advise.

## 2023-08-04 NOTE — ED Triage Notes (Signed)
Central cp with radiation to L arm. Associated dizziness and nausea. Denies h/a, vision change, parasthesia. Pt alert and oriented. Breathing unlabored. Reports cp and nausea with acute onset 1800

## 2023-08-04 NOTE — ED Provider Notes (Signed)
[facility] Provider Note    Event Date/Time   First MD Initiated Contact with Patient 08/04/23 2026     (approximate)  History   Chief Complaint: Chest Pain  HPI  Lawrence [name] is a 67 y.o. male with a past medical history of atrial fibrillation, CAD, angina, COPD, gastric reflux, MI, stent x 4, presents to the emergency department for chest pain.  Patient states he was seen in the emergency department yesterday for chest pain.  States the chest pain did alleviate yesterday but returned today around 6 PM or so.  Patient describes moderate central chest pain.  Some radiation to the left arm.  Does states some nausea as well.  Patient denies any abdominal pain.  Denies any fever cough.  Physical Exam   Triage Vital Signs: ED Triage Vitals  Encounter Vitals Group     BP 08/04/23 1942 (!) 169/85     Systolic BP Percentile --      Diastolic BP Percentile --      Pulse Rate 08/04/23 1942 (!) 52     Resp 08/04/23 1942 18     Temp 08/04/23 1942 98.2 F (36.8 C)     Temp Source 08/04/23 1942 Oral     SpO2 08/04/23 1942 100 %     Weight 08/04/23 1940 190 lb (86.2 kg)     Height 08/04/23 1940 5\' 7"  (1.702 m)     Head Circumference --      Peak Flow --      Pain Score 08/04/23 1940 9     Pain Loc --      Pain Education --      Exclude from Growth Chart --     Most recent vital signs: Vitals:   08/04/23 1942  BP: (!) 169/85  Pulse: (!) 52  Resp: 18  Temp: 98.2 F (36.8 C)  SpO2: 100%    General: Awake, no distress.  CV:  Good peripheral perfusion.  Regular rate and rhythm  Resp:  Normal effort.  Equal breath sounds bilaterally.  Abd:  No distention.  Soft, nontender.  No rebound or guarding.  ED Results / Procedures / Treatments   EKG  EKG viewed and interpreted by myself shows sinus bradycardia 54 bpm with a narrow QRS, normal axis, normal intervals, no concerning ST changes.  RADIOLOGY  I reviewed and interpreted chest x-ray images.  No  obvious consolidation on my evaluation.  Appears to have a monitor possibly implanted in the left chest. Radiology has read the chest x-ray as stable with no acute process.   MEDICATIONS ORDERED IN ED: Medications  HYDROmorphone (DILAUDID) injection 1 mg (has no administration in time range)  ondansetron (ZOFRAN) injection 4 mg (has no administration in time range)     IMPRESSION / MDM / ASSESSMENT AND PLAN / ED COURSE  I reviewed the triage vital signs and the nursing notes.  Patient's presentation is most consistent with acute presentation with potential threat to life or bodily function.  Patient presents emergency department for chest pain.  Patient was seen in the emergency department yesterday for the same states the chest pain resolved and then returned tonight.  Overall the patient appears well, no distress.  Patient's CBC is reassuring, chemistry is reassuring however the patient's troponin has increased to 209 from 20 yesterday.  Given the patient's significant troponin elevation and ongoing chest pain we will start the patient on heparin for likely NSTEMI, we will dose pain medication and  continue to closely monitor.  CRITICAL CARE Performed by: Minna Antis   Total critical care time: 30 minutes  Critical care time was exclusive of separately billable procedures and treating other patients.  Critical care was necessary to treat or prevent imminent or life-threatening deterioration.  Critical care was time spent personally by me on the following activities: development of treatment plan with patient and/or surrogate as well as nursing, discussions with consultants, evaluation of patient's response to treatment, examination of patient, obtaining history from patient or surrogate, ordering and performing treatments and interventions, ordering and review of laboratory studies, ordering and review of radiographic studies, pulse oximetry and re-evaluation of patient's  condition.   FINAL CLINICAL IMPRESSION(S) / ED DIAGNOSES   Chest pain NSTEMI  Note:  This document was prepared using Dragon voice recognition software and may include unintentional dictation errors.   Minna Antis, MD 08/04/23 2042

## 2023-08-04 NOTE — Consult Note (Signed)
ANTICOAGULATION CONSULT NOTE - Initial Consult  Pharmacy Consult for heparin infusion Indication: chest pain/ACS, apixaban PTA  Allergies  Allergen Reactions   Prednisone Other (See Comments) and Hypertension    Pt states that this medication puts him in A-fib    Demerol  [Meperidine Hcl]    Demerol [Meperidine] Hives   Jardiance [Empagliflozin] Other (See Comments)    Perineal pain   Sulfa Antibiotics Hives   Albuterol Sulfate [Albuterol] Palpitations and Other (See Comments)    Pt currently uses this medication.     Morphine Sulfate Nausea And Vomiting, Rash and Other (See Comments)    Pt states that he is only allergic to the tablet form of this medication.      Patient Measurements: Height: 5\' 7"  (170.2 cm) Weight: 86.2 kg (190 lb) IBW/kg (Calculated) : 66.1 Heparin Dosing Weight: 83.7 kg  Vital Signs: Temp: 98.2 F (36.8 C) (09/18 1942) Temp Source: Oral (09/18 1942) BP: 169/85 (09/18 1942) Pulse Rate: 52 (09/18 1942)  Labs: Recent Labs    08/02/23 2356 08/03/23 0156 08/04/23 1944  HGB 14.4  --  13.6  HCT 45.6  --  44.6  PLT 177  --  149*  LABPROT  --   --  14.9  INR  --   --  1.2  CREATININE 1.10  --  0.77  TROPONINIHS 27* 20* 209*    Estimated Creatinine Clearance: 91.3 mL/min (by C-G formula based on SCr of 0.77 mg/dL).   Medical History: Past Medical History:  Diagnosis Date   A-fib (HCC)    Anemia    Anginal pain (HCC)    Anxiety    Arthritis    Asthma    CAD (coronary artery disease)    a. 2002 CABGx2 (LIMA->LAD, VG->VG->OM1);  b. 09/2012 DES->OM;  c. 03/2015 PTCA of LAD [facility]) in setting of atretic LIMA; d. 05/2015 Cath [facility]): nonobs dzs; e. 06/2015 Cath ([facility]): LM nl, LAD 45p/d ISR, 50d, D1/2 small, LCX 50p/d ISR, OM1 70ost, 30 ISR, VG->OM1 50ost, 39m, LIMA->LAD 99p/d - atretic, RCA dom, nl; f.cath 10/16: 40-50%(FFR 0.90) pLAD, 75% (FFR 0.77) mLAD s/p PCI/DES, oRCA 40% (FFR0.95)   Cancer (HCC)    SKIN CANCER ON BACK   Celiac disease    Chronic  diastolic CHF (congestive heart failure) (HCC)    a. 06/2009 Echo: EF 60-65%, Gr 1 DD, triv AI, mildly dil LA, nl RV.   COPD (chronic obstructive pulmonary disease) (HCC)    a. Chronic bronchitis and emphysema.   DDD (degenerative disc disease), lumbar    Diverticulosis    Dysrhythmia    Essential hypertension    GERD (gastroesophageal reflux disease)    History of hiatal hernia    History of kidney stones    H/O   History of tobacco abuse    a. Quit 2014.   Myocardial infarction [facility]) 2002   4 STENTS   Pancreatitis    PSVT (paroxysmal supraventricular tachycardia)    a. 10/2012 Noted on Zio Patch.   Sleep apnea    LOST CORD TO CPAP -ONLY 02 @ BEDTIME   Tubular adenoma of colon    Type II diabetes mellitus (HCC)     Medications:  PTA: apixaban 5 mg BID- last dose 9/18 ~ 1900 per patient  Assessment: 67 y.o. male with a past medical history of atrial fibrillation, CAD, angina, COPD, gastric reflux, MI, stent x 4, presents to the emergency department for chest pain. Initial troponin I 209. Pharmacy has been consulted to  initiate and manage IV heparin therapy.    Goal of Therapy:  Heparin level 0.3-0.7 units/ml aPTT 66-102 seconds Monitor platelets by anticoagulation protocol: Yes   Plan:  Start heparin infusion at 1100 units/hr at 0500 on 9/19 due to recent apixaban dose given Check aPTT level in 6 hours following infusion start given Will monitor via aPTT given recent DOAC and transition to HL once therapeutic and correlating with aPTT Continue to monitor H&H and platelets  Sharen Hones, PharmD, BCPS Clinical Pharmacist   08/04/2023,8:56 PM

## 2023-08-04 NOTE — ED Notes (Signed)
Pt states he has sleep apnea and uses 2l 02 [place] at home every night for sleep. Pt is currently on 2l [place] per pt request.

## 2023-08-05 ENCOUNTER — Other Ambulatory Visit: Payer: Self-pay

## 2023-08-05 DIAGNOSIS — E782 Mixed hyperlipidemia: Secondary | ICD-10-CM | POA: Diagnosis not present

## 2023-08-05 DIAGNOSIS — I25112 Atherosclerotic heart disease of native coronary artery with refractory angina pectoris: Secondary | ICD-10-CM

## 2023-08-05 DIAGNOSIS — I480 Paroxysmal atrial fibrillation: Secondary | ICD-10-CM | POA: Diagnosis not present

## 2023-08-05 DIAGNOSIS — I5032 Chronic diastolic (congestive) heart failure: Secondary | ICD-10-CM | POA: Diagnosis not present

## 2023-08-05 DIAGNOSIS — R079 Chest pain, unspecified: Secondary | ICD-10-CM | POA: Diagnosis present

## 2023-08-05 DIAGNOSIS — E119 Type 2 diabetes mellitus without complications: Secondary | ICD-10-CM | POA: Diagnosis not present

## 2023-08-05 DIAGNOSIS — I10 Essential (primary) hypertension: Secondary | ICD-10-CM

## 2023-08-05 DIAGNOSIS — I214 Non-ST elevation (NSTEMI) myocardial infarction: Secondary | ICD-10-CM | POA: Diagnosis not present

## 2023-08-05 LAB — CBG MONITORING, ED: Glucose-Capillary: 112 mg/dL — ABNORMAL HIGH (ref 70–99)

## 2023-08-05 LAB — BASIC METABOLIC PANEL
Anion gap: 6 (ref 5–15)
BUN: 10 mg/dL (ref 8–23)
CO2: 26 mmol/L (ref 22–32)
Calcium: 8.9 mg/dL (ref 8.9–10.3)
Chloride: 107 mmol/L (ref 98–111)
Creatinine, Ser: 0.75 mg/dL (ref 0.61–1.24)
GFR, Estimated: >60 mL/min (ref >60)
Glucose, Bld: 102 mg/dL — ABNORMAL HIGH (ref 70–99)
Potassium: 4.6 mmol/L (ref 3.5–5.1)
Sodium: 139 mmol/L (ref 135–145)

## 2023-08-05 LAB — CBC
HCT: 41.6 % (ref 39.0–52.0)
Hemoglobin: 13 g/dL (ref 13.0–17.0)
MCH: 29.6 pg (ref 26.0–34.0)
MCHC: 31.3 g/dL (ref 30.0–36.0)
MCV: 94.8 fL (ref 80.0–100.0)
Platelets: 155 10*3/uL (ref 150–400)
RBC: 4.39 MIL/uL (ref 4.22–5.81)
RDW: 15.9 % — ABNORMAL HIGH (ref 11.5–15.5)
WBC: 5.1 10*3/uL (ref 4.0–10.5)
nRBC: 0 % (ref 0.0–0.2)

## 2023-08-05 LAB — TROPONIN I (HIGH SENSITIVITY)
Troponin I (High Sensitivity): 130 ng/L (ref <18)
Troponin I (High Sensitivity): 169 ng/L (ref <18)
Troponin I (High Sensitivity): 258 ng/L (ref <18)

## 2023-08-05 LAB — HEMOGLOBIN A1C
Hgb A1c MFr Bld: 6.1 % — ABNORMAL HIGH (ref 4.8–5.6)
Mean Plasma Glucose: 128 mg/dL

## 2023-08-05 MED ORDER — ISOSORBIDE MONONITRATE ER 60 MG PO TB24
120.0000 mg | ORAL_TABLET | Freq: Every day | ORAL | Status: DC
  Administered 2023-08-05: 120 mg via ORAL
  Filled 2023-08-05: qty 2

## 2023-08-05 MED ORDER — RANOLAZINE ER 1000 MG PO TB12: 1000.0000 mg | ORAL_TABLET | Freq: Two times a day (BID) | ORAL | 0 refills | Status: DC

## 2023-08-05 MED ORDER — VENLAFAXINE HCL ER 75 MG PO CP24
75.0000 mg | ORAL_CAPSULE | Freq: Every day | ORAL | Status: DC
  Filled 2023-08-05: qty 1

## 2023-08-05 MED ORDER — FLUTICASONE FUROATE-VILANTEROL 200-25 MCG/ACT IN AEPB
1.0000 | INHALATION_SPRAY | Freq: Every day | RESPIRATORY_TRACT | Status: DC
  Administered 2023-08-05: 1 via RESPIRATORY_TRACT
  Filled 2023-08-05 (×2): qty 28

## 2023-08-05 MED ORDER — ALPRAZOLAM 0.5 MG PO TABS: 1.0000 mg | ORAL_TABLET | Freq: Three times a day (TID) | ORAL | Status: DC | PRN

## 2023-08-05 MED ORDER — AMIODARONE HCL 200 MG PO TABS: 200.0000 mg | ORAL_TABLET | Freq: Every day | ORAL | Status: DC

## 2023-08-05 MED ORDER — METOPROLOL SUCCINATE ER 25 MG PO TB24: 12.5000 mg | ORAL_TABLET | Freq: Every day | ORAL | Status: DC

## 2023-08-05 MED ORDER — LISINOPRIL 10 MG PO TABS
20.0000 mg | ORAL_TABLET | Freq: Every day | ORAL | Status: DC
  Administered 2023-08-05: 20 mg via ORAL
  Filled 2023-08-05: qty 2

## 2023-08-05 MED ORDER — AMLODIPINE BESYLATE 5 MG PO TABS
5.0000 mg | ORAL_TABLET | Freq: Every day | ORAL | Status: DC
  Administered 2023-08-05: 5 mg via ORAL
  Filled 2023-08-05: qty 1

## 2023-08-05 MED ORDER — MECLIZINE HCL 25 MG PO TABS: 25.0000 mg | ORAL_TABLET | Freq: Three times a day (TID) | ORAL | Status: DC | PRN

## 2023-08-05 MED ORDER — BUDESON-GLYCOPYRROL-FORMOTEROL 160-9-4.8 MCG/ACT IN AERO: 2.0000 | INHALATION_SPRAY | Freq: Two times a day (BID) | RESPIRATORY_TRACT | Status: DC

## 2023-08-05 MED ORDER — ATORVASTATIN CALCIUM 20 MG PO TABS: 80.0000 mg | ORAL_TABLET | Freq: Every day | ORAL | Status: DC

## 2023-08-05 MED ORDER — TRAZODONE HCL 100 MG PO TABS: 100.0000 mg | ORAL_TABLET | Freq: Every day | ORAL | Status: DC

## 2023-08-05 MED ORDER — PANTOPRAZOLE SODIUM 40 MG PO TBEC
40.0000 mg | DELAYED_RELEASE_TABLET | Freq: Two times a day (BID) | ORAL | Status: DC
  Administered 2023-08-05: 40 mg via ORAL
  Filled 2023-08-05: qty 1

## 2023-08-05 MED ORDER — UMECLIDINIUM BROMIDE 62.5 MCG/ACT IN AEPB
1.0000 | INHALATION_SPRAY | Freq: Every day | RESPIRATORY_TRACT | Status: DC
  Filled 2023-08-05: qty 7

## 2023-08-05 MED ORDER — ASPIRIN 81 MG PO CHEW
324.0000 mg | CHEWABLE_TABLET | Freq: Once | ORAL | Status: AC
  Administered 2023-08-05: 324 mg via ORAL
  Filled 2023-08-05: qty 4

## 2023-08-05 MED ORDER — RANOLAZINE ER 500 MG PO TB12: 1000.0000 mg | ORAL_TABLET | Freq: Two times a day (BID) | ORAL | Status: DC

## 2023-08-05 MED ORDER — TORSEMIDE 20 MG PO TABS: 50.0000 mg | ORAL_TABLET | Freq: Every day | ORAL | Status: DC

## 2023-08-05 NOTE — ED Notes (Signed)
Pt c/o feeling nauseous. Less than 50% breakfast consumed.

## 2023-08-05 NOTE — ED Notes (Signed)
Pt refusing care at this time as per pt, cardiology said that he could go home and that is what I want to do. RN called hopitalist and confirmed that pt is going to be d/c. Per hospitalist, we can stop all meds and test and he will put in d/c order.

## 2023-08-05 NOTE — Discharge Summary (Signed)
Physician Discharge Summary   Patient: Lawrence [name] MRN: [id number] DOB: 1956-04-19  Admit date:     08/04/2023  Discharge date: 08/05/23  Discharge Physician: Marcelino Duster   PCP: Malva Limes, MD   Recommendations at discharge:    Follow up with PCP in 1 week. Cardiology follow up a scheduled.  Discharge Diagnoses: Principal Problem:   NSTEMI (non-ST elevated myocardial infarction) (HCC) Active Problems:   CAD (coronary artery disease)   HLD (hyperlipidemia)   Chronic diastolic CHF (congestive heart failure) (HCC)   Paroxysmal atrial fibrillation (HCC)   Diabetes mellitus without complication (HCC)   HTN (hypertension)   Depression with anxiety   Chest pain  Resolved Problems:   * No resolved hospital problems. *  Hospital Course: STEVON BROWNSON is a 67 y.o. male with medical history significant of CAD (s/p of stent x 4 and CBAG 2016), a fib on Eliquis, PSVT,  HTN, HLD, DM, COPD on 2L O2, OSA not using CPAP, dCHF, anxiety, celiac disease, pancreatitis, presented to ED for evaluation of chest pain.   Patient had chest pain yesterday and was seen in ED. He had mild troponin elevation 20. Pt was discharged home after his chest pain improved.  He started having chest pain again this morning, which is located in substernal area, initially 9 out of 10 in severity, currently 7 out of 10 in severity, pressure-like, radiating to the left arm, not pleuritic, not aggravated by deep breath. Patient states he has chronic cough and shortness of breath due to COPD which has not changed.  In the ED- he remains hemodynamically stable, troponin bump noted from 20 to 209. He is started on Heparin drip, cardiology consulted.  During hospital stay, he has on and off chest discomfort. Troponin trending down. Cardiology team evaluated him, advised elevated troponin most consistent with demand/supply mismatch and not ACS. EKG nonacute. He is advised to continue home ranexa 1000 mg bid,  imdur 120 mg daily, Lisinopril 20mg  daily, atorvastatin 80 mg daily and plavix 75 mg daily.  He is hemodynamically stable to be discharged home.  I advised him to follow-up with PCP, cardiology upon discharge as instructed.  He understands and agrees with the discharge plan.       Consultants: Cardiology Procedures performed: None Disposition: Home Diet recommendation:  Discharge Diet Orders (From admission, onward)     Start     Ordered   08/05/23 0000  Diet - low sodium heart healthy        08/05/23 1217   08/05/23 0000  Diet Carb Modified        08/05/23 1217           Cardiac and Carb modified diet DISCHARGE MEDICATION: Allergies as of 08/05/2023       Reactions   Prednisone Other (See Comments), Hypertension   Pt states that this medication puts him in A-fib    Demerol  [meperidine Hcl]    Demerol [meperidine] Hives   Jardiance [empagliflozin] Other (See Comments)   Perineal pain   Sulfa Antibiotics Hives   Albuterol Sulfate [albuterol] Palpitations, Other (See Comments)   Pt currently uses this medication.     Morphine Sulfate Nausea And Vomiting, Rash, Other (See Comments)   Pt states that he is only allergic to the tablet form of this medication.          Medication List     STOP taking these medications    ondansetron 4 MG tablet Commonly known as: Zofran  TAKE these medications    Accu-Chek Guide Control Liqd Use with blood glucose monitor as directed   Accu-Chek Guide test strip Generic drug: glucose blood Use as instructed to check sugar daily for type 2 diabetes.   Accu-Chek Guide w/Device Kit Use to check blood sugars as directed   Accu-Chek Softclix Lancets lancets Use as instructed to check sugar daily for type 2 diabetes.   albuterol 108 (90 Base) MCG/ACT inhaler Commonly known as: VENTOLIN HFA INHALE 2 PUFFS BY MOUTH EVERY 6 HOURS AS NEEDED FOR SHORTNESS OF BREATH   allopurinol 300 MG tablet Commonly known as:  ZYLOPRIM TAKE 1 TABLET BY MOUTH TWICE A DAY   ALPRAZolam 1 MG tablet Commonly known as: XANAX Take 1 tablet (1 mg total) by mouth 3 (three) times daily as needed. What changed: when to take this   amLODipine 5 MG tablet Commonly known as: NORVASC Take 1 tablet by mouth daily.   apixaban 5 MG Tabs tablet Commonly known as: Eliquis Take 1 tablet (5 mg total) by mouth 2 (two) times daily.   atorvastatin 80 MG tablet Commonly known as: LIPITOR TAKE 1 TABLET BY MOUTH AT BEDTIME   Breztri Aerosphere 160-9-4.8 MCG/ACT Aero Generic drug: Budeson-Glycopyrrol-Formoterol Inhale 2 puffs into the lungs 2 (two) times daily.   cetirizine 10 MG tablet Commonly known as: ZYRTEC TAKE 1 TABLET BY MOUTH AT BEDTIME   clonazePAM 0.5 MG tablet Commonly known as: KLONOPIN Take 0.5 mg by mouth 2 (two) times daily.   clopidogrel 75 MG tablet Commonly known as: PLAVIX Take 1 tablet (75 mg total) by mouth daily.   dicyclomine 10 MG capsule Commonly known as: BENTYL Take 1 capsule (10 mg total) by mouth 3 (three) times daily as needed (abdominal pain).   Farxiga 10 MG Tabs tablet Generic drug: dapagliflozin propanediol Take 10 mg by mouth daily.   glipiZIDE 5 MG tablet Commonly known as: GLUCOTROL Take 1 tablet (5 mg total) by mouth daily as needed (sugar over 300).   iron polysaccharides 150 MG capsule Commonly known as: NIFEREX Take 1 capsule (150 mg total) by mouth daily.   isosorbide mononitrate 120 MG 24 hr tablet Commonly known as: IMDUR Take 1 tablet (120 mg total) by mouth daily.   lisinopril 20 MG tablet Commonly known as: ZESTRIL Take 20 mg by mouth daily.   metFORMIN 500 MG 24 hr tablet Commonly known as: GLUCOPHAGE-XR Take 1 tablet (500 mg total) by mouth 2 (two) times daily. What changed: when to take this   methocarbamol 500 MG tablet Commonly known as: ROBAXIN TAKE 1 TABLET BY MOUTH EVERY 8 HOURS AS NEEDED FOR MUSCLE SPASMS   omega-3 acid ethyl esters 1 g  capsule Commonly known as: LOVAZA TAKE 4 CAPSULES (4 GRAMS TOTAL) BY MOUTHDAILY   oxyCODONE-acetaminophen 10-325 MG tablet Commonly known as: PERCOCET TAKE 1 TABLET BY MOUTH EVERY 4 HOURS AS NEEDED FOR PAIN   pantoprazole 40 MG tablet Commonly known as: PROTONIX Take 1 tablet (40 mg total) by mouth 2 (two) times daily for 14 days.   ranolazine 1000 MG SR tablet Commonly known as: RANEXA Take 1 tablet (1,000 mg total) by mouth 2 (two) times daily.   traZODone 150 MG tablet Commonly known as: DESYREL TAKE 1 TABLET BY MOUTH AT BEDTIME What changed: how much to take   venlafaxine XR 75 MG 24 hr capsule Commonly known as: EFFEXOR-XR TAKE 1 CAPSULE BY MOUTH DAILY WITH BREAKFAST        Discharge Exam: [employer]  08/04/23 1940  Weight: 86.2 kg  General - Elderly Caucasian male, no apparent distress HEENT - PERRLA, EOMI, atraumatic head, non tender sinuses. Lung - Clear, no rales, rhonchi, wheezes. Heart - S1, S2 heard, no murmurs, rubs, trace pedal edema Neuro - Alert, awake and oriented x 3, non focal exam. Skin - Warm and dry.  Condition at discharge: stable  The results of significant diagnostics from this hospitalization (including imaging, microbiology, ancillary and laboratory) are listed below for reference.   Imaging Studies: DG Chest 1 View  Result Date: 08/04/2023 CLINICAL DATA:  Central chest pain radiating to left arm, dizziness and nausea EXAM: CHEST  1 VIEW COMPARISON:  08/02/2023 FINDINGS: Single frontal view of the chest demonstrates an unremarkable cardiac silhouette. Postsurgical changes from median sternotomy. Implanted device within the left anterior chest wall unchanged. No acute bony abnormalities. IMPRESSION: 1. Stable chest, no acute intrathoracic process. Electronically Signed   By: Sharlet Salina M.D.   On: 08/04/2023 20:28   DG Chest 2 View  Result Date: 08/03/2023 CLINICAL DATA:  Chest pain EXAM: CHEST - 2 VIEW COMPARISON:  07/21/2023  FINDINGS: Lungs are clear.  No pleural effusion or pneumothorax. The heart is normal in size. Postsurgical changes related to prior CABG. Median sternotomy.  Mild degenerative changes of the thoracic spine. IMPRESSION: Normal chest radiographs. Electronically Signed   By: Charline Bills M.D.   On: 08/03/2023 00:35   DG Chest 2 View  Result Date: 07/21/2023 CLINICAL DATA:  Shortness of breath EXAM: CHEST - 2 VIEW COMPARISON:  X-ray 07/13/2023 FINDINGS: There is some linear opacity at the left lung base likely scar or atelectasis. No consolidation, pneumothorax or effusion. No edema. Diffuse interstitial changes identified, possibly chronic. Status post median sternotomy with a calcified aorta. Normal cardiopericardial silhouette. Presumed coronary artery stents. Degenerative changes along the spine. Air-fluid level along the stomach beneath the left hemidiaphragm IMPRESSION: Postop chest. Diffuse interstitial changes, possibly chronic. Mild linear opacity left lung base. Atelectasis or scar is favored. Electronically Signed   By: Karen Kays M.D.   On: 07/21/2023 17:54   DG Chest Port 1 View  Result Date: 07/13/2023 CLINICAL DATA:  200808 Hypoxia [id number] EXAM: PORTABLE CHEST - 1 VIEW COMPARISON:  07/11/2023 FINDINGS: Patchy airspace opacities at the left lung base previous minimally improved. Heart size and mediastinal contours are within normal limits. Aortic Atherosclerosis (ICD10-170.0). No effusion. Sternotomy wires. IMPRESSION: Minimally improved left basilar airspace disease. Electronically Signed   By: Corlis Leak M.D.   On: 07/13/2023 15:57   DG Chest Port 1 View  Result Date: 07/11/2023 CLINICAL DATA:  Left-sided chest pain. Increased shortness of breath. Cough. COVID positive. EXAM: PORTABLE CHEST 1 VIEW COMPARISON:  Radiograph 2 days ago 07/09/2023 FINDINGS: Prior median sternotomy. Stable cardiomegaly post CABG. Chronic peribronchial thickening. Insert increase in patchy airspace disease at  the left lung base. No pneumothorax or large pleural effusion. No acute osseous findings. IMPRESSION: 1. Increasing patchy airspace disease at the left lung base, atelectasis versus pneumonia. 2. Chronic peribronchial thickening. Stable cardiomegaly post CABG. Electronically Signed   By: Narda Rutherford M.D.   On: 07/11/2023 23:26   DG Chest 2 View  Result Date: 07/09/2023 CLINICAL DATA:  Chest pain. EXAM: CHEST - 2 VIEW COMPARISON:  Chest radiograph 06/15/2023, 06/12/2023, 04/27/2023 FINDINGS: Status post median sternotomy. Cardiac silhouette is again at the upper limits of normal size. Mediastinal contours are within limits with mild-to-moderate calcification within the aortic arch. There is again flattening of the diaphragms and  mild-to-moderate hyperinflation. There is again cephalization of the pulmonary vasculature as can be seen with pulmonary venous hypertension. Minimal bilateral interstitial thickening is similar to 06/15/2023. Chronic left-greater-than-right basilar linear scarring is unchanged from multiple prior radiographs. No pleural effusion or pneumothorax. Moderate multilevel degenerative disc changes of the thoracic spine. Cholecystectomy clips. IMPRESSION: 1. No significant interval change. Findings suggestive of pulmonary venous hypertension. No overt pulmonary edema. 2. Chronic hyperinflation compatible with COPD. Electronically Signed   By: Neita Garnet M.D.   On: 07/09/2023 13:52    Microbiology: Results for orders placed or performed during the hospital encounter of 07/09/23  Resp panel by RT-PCR (RSV, Flu A&B, Covid) Anterior Nasal Swab     Status: Abnormal   Collection Time: 07/09/23 12:59 PM   Specimen: Anterior Nasal Swab  Result Value Ref Range Status   SARS Coronavirus 2 by RT PCR POSITIVE (A) NEGATIVE Final    Comment: (NOTE) SARS-CoV-2 target nucleic acids are DETECTED.  The SARS-CoV-2 RNA is generally detectable in upper respiratory specimens during the acute  phase of infection. Positive results are indicative of the presence of the identified virus, but do not rule out bacterial infection or co-infection with other pathogens not detected by the test. Clinical correlation with patient history and other diagnostic information is necessary to determine patient infection status. The expected result is Negative.  Fact Sheet for Patients: BloggerCourse.com  Fact Sheet for Healthcare Providers: SeriousBroker.it  This test is not yet approved or cleared by the [place] FDA and  has been authorized for detection and/or diagnosis of SARS-CoV-2 by FDA under an Emergency Use Authorization (EUA).  This EUA will remain in effect (meaning this test can be used) for the duration of  the COVID-19 declaration under Section 564(b)(1) of the A ct, 21 U.S.C. section 360bbb-3(b)(1), unless the authorization is terminated or revoked sooner.     Influenza A by PCR NEGATIVE NEGATIVE Final   Influenza B by PCR NEGATIVE NEGATIVE Final    Comment: (NOTE) The Xpert Xpress SARS-CoV-2/FLU/RSV plus assay is intended as an aid in the diagnosis of influenza from Nasopharyngeal swab specimens and should not be used as a sole basis for treatment. Nasal washings and aspirates are unacceptable for Xpert Xpress SARS-CoV-2/FLU/RSV testing.  Fact Sheet for Patients: BloggerCourse.com  Fact Sheet for Healthcare Providers: SeriousBroker.it  This test is not yet approved or cleared by the [place] FDA and has been authorized for detection and/or diagnosis of SARS-CoV-2 by FDA under an Emergency Use Authorization (EUA). This EUA will remain in effect (meaning this test can be used) for the duration of the COVID-19 declaration under Section 564(b)(1) of the Act, 21 U.S.C. section 360bbb-3(b)(1), unless the authorization is terminated or revoked.     Resp Syncytial  Virus by PCR NEGATIVE NEGATIVE Final    Comment: (NOTE) Fact Sheet for Patients: BloggerCourse.com  Fact Sheet for Healthcare Providers: SeriousBroker.it  This test is not yet approved or cleared by the [place] FDA and has been authorized for detection and/or diagnosis of SARS-CoV-2 by FDA under an Emergency Use Authorization (EUA). This EUA will remain in effect (meaning this test can be used) for the duration of the COVID-19 declaration under Section 564(b)(1) of the Act, 21 U.S.C. section 360bbb-3(b)(1), unless the authorization is terminated or revoked.  Performed at [facility], [address] [place], [place] [address]    *Note: Due to a large number of results and/or encounters for the requested time period, some results have not been  displayed. A complete set of results can be found in Results Review.    Labs: CBC: Recent Labs  Lab 08/02/23 2356 08/04/23 1944 08/05/23 0528  WBC 7.2 6.1 5.1  NEUTROABS 5.1  --   --   HGB 14.4 13.6 13.0  HCT 45.6 44.6 41.6  MCV 91.9 94.9 94.8  PLT 177 149* 155   Basic Metabolic Panel: Recent Labs  Lab 08/02/23 2356 08/04/23 1944 08/05/23 0940  NA 140 140 139  K 3.8 3.8 4.6  CL 100 108 107  CO2 28 25 26   GLUCOSE 100* 158* 102*  BUN 22 12 10   CREATININE 1.10 0.77 0.75  CALCIUM 9.3 8.9 8.9   Liver Function Tests: Recent Labs  Lab 08/02/23 2356 08/04/23 1944  AST 16 14*  ALT 15 13  ALKPHOS 64 57  BILITOT 0.8 0.7  PROT 6.7 6.3*  ALBUMIN 3.8 3.5   CBG: Recent Labs  Lab 08/04/23 2119 08/05/23 0802  GLUCAP 120* 112*    Discharge time spent: 34 minutes.  Signed: Marcelino Duster, MD [facility] 08/05/2023

## 2023-08-05 NOTE — Care Management CC44 (Signed)
Condition Code 44 Documentation Completed  Patient Details  Name: Lawrence [name] MRN: [id number] Date of Birth: October 13, 1956   Condition Code 44 given:  Yes Patient signature on Condition Code 44 notice:  Yes Documentation of 2 MD's agreement:  Yes Code 44 added to claim:  Yes    Margarito Liner, LCSW 08/05/2023, 12:31 PM

## 2023-08-05 NOTE — TOC Transition Note (Signed)
Transition of Care [facility]) - CM/SW Discharge Note   Patient Details  Name: Lawrence [name] MRN: [id number] Date of Birth: [birth year]  Transition of Care [facility]) CM/SW Contact:  Margarito Liner, LCSW Phone Number: 08/05/2023, 12:31 PM   Clinical Narrative:   Patient has orders to discharge home today. No further concerns. CSW signing off.  Final next level of care: Home/Self Care Barriers to Discharge: No Barriers Identified   Patient Goals and CMS Choice      Discharge Placement                      Patient and family notified of of transfer: 08/05/23  Discharge Plan and Services Additional resources added to the After Visit Summary for                                       Social Determinants of Health (SDOH) Interventions SDOH Screenings   Food Insecurity: No Food Insecurity (07/16/2023)  Housing: Low Risk  (05/19/2023)  Transportation Needs: No Transportation Needs (07/16/2023)  Utilities: Not At Risk (07/13/2023)  Alcohol Screen: Low Risk  (03/10/2023)  Depression (PHQ2-9): Low Risk  (07/21/2023)  Recent Concern: Depression (PHQ2-9) - Medium Risk (06/02/2023)  Financial Resource Strain: Low Risk  (03/10/2023)  Physical Activity: Inactive (03/10/2023)  Social Connections: Moderately Isolated (03/10/2023)  Stress: No Stress Concern Present (03/10/2023)  Tobacco Use: Medium Risk (08/04/2023)     Readmission Risk Interventions    08/05/2023   10:11 AM 07/13/2023   10:52 AM 05/18/2023   10:56 AM  Readmission Risk Prevention Plan  Transportation Screening Complete Complete Complete  Medication Review Oceanographer) Complete Complete Complete  PCP or Specialist appointment within 3-5 days of discharge Complete Complete Complete  HRI or Home Care Consult   Complete  SW Recovery Care/Counseling Consult Complete Complete Complete  Palliative Care Screening Not Applicable Not Applicable Not Applicable  Skilled Nursing Facility Not Applicable Not Applicable Not  Applicable

## 2023-08-05 NOTE — Care Management Obs Status (Signed)
MEDICARE OBSERVATION STATUS NOTIFICATION   Patient Details  Name: Lawrence [name] MRN: [id number] Date of Birth: [birth year]   Medicare Observation Status Notification Given:  Yes    Margarito Liner, LCSW 08/05/2023, 12:30 PM

## 2023-08-05 NOTE — TOC Initial Note (Signed)
Transition of Care [facility]) - Initial/Assessment Note    Patient Details  Name: Lawrence [name] MRN: [id number] Date of Birth: July 28, 1956  Transition of Care [name]) CM/SW Contact:    Margarito Liner, LCSW Phone Number: 08/05/2023, 10:17 AM  Clinical Narrative:  This CSW completed patient's readmission prevention screen on 8/27:  "Readmission prevention screen complete. Patient is on airborne isolation precautions so CSW called him in the room, introduced role, and explained that discharge planning would be discussed. PCP is [name], MD. He uses Medicaid Transportation to get to appointments. Pharmacy is [facility]. No issues obtaining medications. Patient lives home alone. He was previously active with [facility] but Francine Graven has stopped paying for visits because he met his goals. Patient was at [facility] 8/2-8/15 (13 days). Patient has a rollator, wheelchair, and 3-in-1 at home. He is on 2 L chronic oxygen at home through [employer]. Patient is currently on 4 L. Will follow to see if he needs updated orders/equipment at discharge for this. No further concerns. CSW encouraged patient to contact CSW as needed. CSW will continue to follow patient for support and facilitate return home once stable. His friend will transport him home at discharge."  Per note on 8/28, [employer] home oxygen orders are for 3 L.                 Expected Discharge Plan: Home/Self Care Barriers to Discharge: Continued Medical Work up   Patient Goals and CMS Choice            Expected Discharge Plan and Services       Living arrangements for the past 2 months: Apartment                                      Prior Living Arrangements/Services Living arrangements for the past 2 months: Apartment Lives with:: Self Patient language and need for interpreter reviewed:: Yes        Need for Family Participation in Patient Care: Yes (Comment)   Current  home services: DME Criminal Activity/Legal Involvement Pertinent to Current Situation/Hospitalization: No - Comment as needed  Activities of Daily Living      Permission Sought/Granted                  Emotional Assessment Appearance:: Appears stated age     Orientation: : Oriented to Self, Oriented to Place, Oriented to  Time, Oriented to Situation Alcohol / Substance Use: Not Applicable Psych Involvement: No (comment)  Admission diagnosis:  NSTEMI (non-ST elevated myocardial infarction) [facility]) [I21.4] Patient Active Problem List   Diagnosis Date Noted   NSTEMI (non-ST elevated myocardial infarction) (HCC) 08/04/2023   Diabetes mellitus without complication (HCC) 08/04/2023   HTN (hypertension) 08/04/2023   CAD (coronary artery disease) 08/04/2023   FTT (failure to thrive) in adult 07/21/2023   Hospital discharge follow-up 07/21/2023   Acute bronchitis with COPD (HCC) 07/21/2023   Diabetes mellitus (HCC) 07/21/2023   AKI (acute kidney injury) (HCC) 07/15/2023   PAD (peripheral artery disease) (HCC) 07/11/2023   Chronic diastolic CHF (congestive heart failure) (HCC)    Hypertensive urgency 04/27/2021   Atherosclerosis of aorta (HCC) 12/16/2020   Polycythemia 10/05/2020   Acute on chronic congestive heart failure (HCC) 04/18/2018   Paroxysmal atrial fibrillation (HCC) 12/23/2015   OSA (obstructive sleep apnea) 12/10/2015   Depression with anxiety 11/07/2015  BPH (benign prostatic hyperplasia) 08/01/2015   Achalasia 07/24/2014   HLD (hyperlipidemia) 04/09/2013   PCP:  Malva Limes, MD Pharmacy:   [facility] - [place], [place] - [address] [place] [address] Phone: [phone] Fax: [phone]  [facility] - [place], [place] - [address] [place] [address] Phone: [phone] Fax: [phone]  [id number] [place], [place] - [place] [address] AT [facility] OF  [address] [place] [address] Phone: [phone] Fax: [phone]     Social Determinants of Health (SDOH) Social History: SDOH Screenings   Food Insecurity: No Food Insecurity (07/16/2023)  Housing: Low Risk  (05/19/2023)  Transportation Needs: No Transportation Needs (07/16/2023)  Utilities: Not At Risk (07/13/2023)  Alcohol Screen: Low Risk  (03/10/2023)  Depression (PHQ2-9): Low Risk  (07/21/2023)  Recent Concern: Depression (PHQ2-9) - Medium Risk (06/02/2023)  Financial Resource Strain: Low Risk  (03/10/2023)  Physical Activity: Inactive (03/10/2023)  Social Connections: Moderately Isolated (03/10/2023)  Stress: No Stress Concern Present (03/10/2023)  Tobacco Use: Medium Risk (08/04/2023)   SDOH Interventions:     Readmission Risk Interventions    08/05/2023   10:11 AM 07/13/2023   10:52 AM 05/18/2023   10:56 AM  Readmission Risk Prevention Plan  Transportation Screening Complete Complete Complete  Medication Review Oceanographer) Complete Complete Complete  PCP or Specialist appointment within 3-5 days of discharge Complete Complete Complete  HRI or Home Care Consult   Complete  SW Recovery Care/Counseling Consult Complete Complete Complete  Palliative Care Screening Not Applicable Not Applicable Not Applicable  Skilled Nursing Facility Not Applicable Not Applicable Not Applicable

## 2023-08-05 NOTE — Consult Note (Signed)
[facility] CLINIC CARDIOLOGY CONSULT NOTE       Patient ID: Lawrence [name] MRN: [id number] DOB/AGE: 69-Aug-1957 69 y.o.  Admit date: 08/04/2023 Referring Physician Dr Clide Dales Primary Physician Dr. [name] Primary Cardiologist Dr. Dorothyann Peng Reason for Consultation chest pain  HPI: Lawrence [name] is a 67 y.o. male  with a past medical history of CAD (h/o STEMI s/p CABG x2 LIMA-LAD and SVG-OM1 2002 c/b coronary artery dissection with multiple subsequent PCI and stents), chronic stable angina, HFpEF (50-55%, mild LVH 10/2022) paroxysmal atrial fibrillation s/p unsuccessful DCCV 06/29/2022, COPD (PRN 2L), hx tobacco use, DM2  who presented to the ED on 08/04/2023 for chest pain. He was recently seen in the ED on 8/23 and was diagnosed with COVID at that time. Cardiology consulted for further evaluation of chest pain, rule out ACS.   Patient seen and examined at bedside, resting comfortably. No events overnight. He states his chest pain is present all the time and does not change with exertion or rest. Of note, patient was here last month with covid infection. I discussed with his primary cardiologist and we will forgo intervention at this time. Denies shortness of breath, palpitations, diaphoresis, syncope, edema, PND, orthopnea.   Review of systems complete and found to be negative unless listed above     Past Medical History:  Diagnosis Date   A-fib (HCC)    Anemia    Anginal pain (HCC)    Anxiety    Arthritis    Asthma    CAD (coronary artery disease)    a. 2002 CABGx2 (LIMA->LAD, VG->VG->OM1);  b. 09/2012 DES->OM;  c. 03/2015 PTCA of LAD [facility]) in setting of atretic LIMA; d. 05/2015 Cath [facility]): nonobs dzs; e. 06/2015 Cath ([facility]): LM nl, LAD 45p/d ISR, 50d, D1/2 small, LCX 50p/d ISR, OM1 70ost, 30 ISR, VG->OM1 50ost, 65m, LIMA->LAD 99p/d - atretic, RCA dom, nl; f.cath 10/16: 40-50%(FFR 0.90) pLAD, 75% (FFR 0.77) mLAD s/p PCI/DES, oRCA 40% (FFR0.95)   Cancer (HCC)    SKIN CANCER ON  BACK   Celiac disease    Chronic diastolic CHF (congestive heart failure) (HCC)    a. 06/2009 Echo: EF 60-65%, Gr 1 DD, triv AI, mildly dil LA, nl RV.   COPD (chronic obstructive pulmonary disease) (HCC)    a. Chronic bronchitis and emphysema.   DDD (degenerative disc disease), lumbar    Diverticulosis    Dysrhythmia    Essential hypertension    GERD (gastroesophageal reflux disease)    History of hiatal hernia    History of kidney stones    H/O   History of tobacco abuse    a. Quit 2014.   Myocardial infarction [facility]) 2002   4 STENTS   Pancreatitis    PSVT (paroxysmal supraventricular tachycardia)    a. 10/2012 Noted on Zio Patch.   Sleep apnea    LOST CORD TO CPAP -ONLY 02 @ BEDTIME   Tubular adenoma of colon    Type II diabetes mellitus (HCC)     Past Surgical History:  Procedure Laterality Date   BYPASS GRAFT     CARDIAC CATHETERIZATION N/A 07/12/2015   rocedure: Left Heart Cath and Cors/Grafts Angiography;  Surgeon: Lyn Records, MD;  Location: [facility] INVASIVE CV LAB;  Service: Cardiovascular;  Laterality: N/A;   CARDIAC CATHETERIZATION Right 10/07/2015   Procedure: Left Heart Cath and Cors/Grafts Angiography;  Surgeon: Laurier Nancy, MD;  Location: [facility] INVASIVE CV LAB;  Service: Cardiovascular;  Laterality: Right;   CARDIAC CATHETERIZATION  N/A 04/06/2016   Procedure: Left Heart Cath and Coronary Angiography;  Surgeon: Alwyn Pea, MD;  Location: [facility] INVASIVE CV LAB;  Service: Cardiovascular;  Laterality: N/A;   CARDIAC CATHETERIZATION  04/06/2016   Procedure: Bypass Graft Angiography;  Surgeon: Alwyn Pea, MD;  Location: [facility] INVASIVE CV LAB;  Service: Cardiovascular;;   CARDIAC CATHETERIZATION N/A 11/02/2016   Procedure: Left Heart Cath and Cors/Grafts Angiography and possible PCI;  Surgeon: Alwyn Pea, MD;  Location: [facility] INVASIVE CV LAB;  Service: Cardiovascular;  Laterality: N/A;   CARDIAC CATHETERIZATION N/A 11/02/2016   Procedure: Coronary Stent  Intervention;  Surgeon: Alwyn Pea, MD;  Location: [facility] INVASIVE CV LAB;  Service: Cardiovascular;  Laterality: N/A;   CARDIOVERSION N/A 06/29/2022   Procedure: CARDIOVERSION;  Surgeon: Lamar Blinks, MD;  Location: [facility] ORS;  Service: Cardiovascular;  Laterality: N/A;   CHOLECYSTECTOMY     CIRCUMCISION N/A 06/09/2019   Procedure: CIRCUMCISION ADULT;  Surgeon: Sondra Come, MD;  Location: [facility] ORS;  Service: Urology;  Laterality: N/A;   COLONOSCOPY WITH PROPOFOL N/A 04/01/2018   Procedure: COLONOSCOPY WITH PROPOFOL;  Surgeon: Scot Jun, MD;  Location: [facility] ENDOSCOPY;  Service: Endoscopy;  Laterality: N/A;   COLONOSCOPY WITH PROPOFOL N/A 05/01/2023   Procedure: COLONOSCOPY WITH PROPOFOL;  Surgeon: Toney Reil, MD;  Location: [facility] ENDOSCOPY;  Service: Gastroenterology;  Laterality: N/A;   ESOPHAGEAL DILATION     ESOPHAGOGASTRODUODENOSCOPY (EGD) WITH PROPOFOL N/A 04/01/2018   Procedure: ESOPHAGOGASTRODUODENOSCOPY (EGD) WITH PROPOFOL;  Surgeon: Scot Jun, MD;  Location: [facility] ENDOSCOPY;  Service: Endoscopy;  Laterality: N/A;   ESOPHAGOGASTRODUODENOSCOPY (EGD) WITH PROPOFOL N/A 05/01/2023   Procedure: ESOPHAGOGASTRODUODENOSCOPY (EGD) WITH PROPOFOL;  Surgeon: Toney Reil, MD;  Location: [facility] ENDOSCOPY;  Service: Gastroenterology;  Laterality: N/A;   GIVENS CAPSULE STUDY  05/01/2023   Procedure: GIVENS CAPSULE STUDY;  Surgeon: Toney Reil, MD;  Location[facility] ENDOSCOPY;  Service: Gastroenterology;;   LEFT HEART CATH AND CORS/GRAFTS ANGIOGRAPHY N/A 06/12/2019   Procedure: LEFT HEART CATH AND CORS/GRAFTS ANGIOGRAPHY;  Surgeon: Dalia Heading, MD;  Location: [facility] INVASIVE CV LAB;  Service: Cardiovascular;  Laterality: N/A;   LEFT HEART CATH AND CORS/GRAFTS ANGIOGRAPHY N/A 03/11/2020   Procedure: LEFT HEART CATH AND CORS/GRAFTS ANGIOGRAPHY;  Surgeon: Marcina Millard, MD;  Location: [facility] INVASIVE CV LAB;  Service: Cardiovascular;  Laterality: N/A;   LEFT  HEART CATH AND CORS/GRAFTS ANGIOGRAPHY N/A 05/01/2021   Procedure: LEFT HEART CATH AND CORS/GRAFTS ANGIOGRAPHY;  Surgeon: Lamar Blinks, MD;  Location: [facility] INVASIVE CV LAB;  Service: Cardiovascular;  Laterality: N/A;   LEFT HEART CATH AND CORS/GRAFTS ANGIOGRAPHY N/A 11/02/2022   Procedure: LEFT HEART CATH AND CORS/GRAFTS ANGIOGRAPHY;  Surgeon: Alwyn Pea, MD;  Location: [facility] INVASIVE CV LAB;  Service: Cardiovascular;  Laterality: N/A;   TONSILLECTOMY     VASCULAR SURGERY      (Not in a hospital admission)  Social History   Socioeconomic History   Marital status: Widowed    Spouse name: Not on file   Number of children: 1   Years of education: Not on file   Highest education level: 10th grade  Occupational History   Occupation: Disabled   Occupation: on Tree surgeon  Tobacco Use   Smoking status: Former    Current packs/day: 0.00    Average packs/day: 3.0 packs/day for 50.0 years (150.0 ttl pk-yrs)    Types: Cigarettes    Start date: 04/23/1963    Quit date: 04/22/2013    Years  since quitting: 10.2   Smokeless tobacco: Never   Tobacco comments:    Reports not smoking for approx 8 years.  Vaping Use   Vaping status: Never Used  Substance and Sexual Activity   Alcohol use: No    Comment: remotely quit alcohol use. Hx of heavy alcohol use.   Drug use: Not Currently    Types: Marijuana   Sexual activity: Not Currently  Other Topics Concern   Not on file  Social History Narrative   Pt lives in [place] with wife.  Does not routinely exercise.   Social Determinants of Health   Financial Resource Strain: Low Risk  (03/10/2023)   Overall Financial Resource Strain (CARDIA)    Difficulty of Paying Living Expenses: Not hard at all  Food Insecurity: No Food Insecurity (07/16/2023)   Hunger Vital Sign    Worried About Running Out of Food in the Last Year: Never true    Ran Out of Food in the Last Year: Never true  Transportation Needs: No Transportation Needs  (07/16/2023)   PRAPARE - Administrator, Civil Service (Medical): No    Lack of Transportation (Non-Medical): No  Physical Activity: Inactive (03/10/2023)   Exercise Vital Sign    Days of Exercise per Week: 0 days    Minutes of Exercise per Session: 0 min  Stress: No Stress Concern Present (03/10/2023)   [employer] of Occupational Health - Occupational Stress Questionnaire    Feeling of Stress : Not at all  Social Connections: Moderately Isolated (03/10/2023)   Social Connection and Isolation Panel [NHANES]    Frequency of Communication with Friends and Family: More than three times a week    Frequency of Social Gatherings with Friends and Family: More than three times a week    Attends Religious Services: More than 4 times per year    Active Member of [employer] or Organizations: No    Attends Banker Meetings: Not on file    Marital Status: Widowed  Intimate Partner Violence: Not At Risk (07/13/2023)   Humiliation, Afraid, Rape, and Kick questionnaire    Fear of Current or Ex-Partner: No    Emotionally Abused: No    Physically Abused: No    Sexually Abused: No    Family History  Problem Relation Age of Onset   Heart attack Mother    Depression Mother    Heart disease Mother    COPD Mother    Hypertension Mother    Heart attack Father    Diabetes Father    Depression Father    Heart disease Father    Cirrhosis Father    Parkinson's disease Brother      Vitals:   08/05/23 0600 08/05/23 0630 08/05/23 0647 08/05/23 0730  BP: 137/80 130/73  (!) 141/80  Pulse: (!) 31 (!) 42  65  Resp: 12 10  11   Temp:   98 F (36.7 C)   TempSrc:   Oral   SpO2: (!) 89% 100%  98%  Weight:      Height:        PHYSICAL EXAM General: alert, well nourished, in no acute distress. HEENT: Normocephalic and atraumatic. Neck: No JVD.  Lungs: Normal respiratory effort. Clear bilaterally to auscultation. No wheezes, crackles, rhonchi.  Heart: HRRR. Normal S1 and S2  without gallops or murmurs.  Abdomen: Non-distended appearing.  Msk: Normal strength and tone for age. Extremities: Warm and well perfused. No clubbing, cyanosis. no edema.  Neuro: Alert and oriented  X 3. Psych: Answers questions appropriately.   Labs: Basic Metabolic Panel: Recent Labs    08/02/23 2356 08/04/23 1944  NA 140 140  K 3.8 3.8  CL 100 108  CO2 28 25  GLUCOSE 100* 158*  BUN 22 12  CREATININE 1.10 0.77  CALCIUM 9.3 8.9   Liver Function Tests: Recent Labs    08/02/23 2356 08/04/23 1944  AST 16 14*  ALT 15 13  ALKPHOS 64 57  BILITOT 0.8 0.7  PROT 6.7 6.3*  ALBUMIN 3.8 3.5   No results for input(s): "LIPASE", "AMYLASE" in the last 72 hours. CBC: Recent Labs    08/02/23 2356 08/04/23 1944 08/05/23 0528  WBC 7.2 6.1 5.1  NEUTROABS 5.1  --   --   HGB 14.4 13.6 13.0  HCT 45.6 44.6 41.6  MCV 91.9 94.9 94.8  PLT 177 149* 155   Cardiac Enzymes: Recent Labs    08/05/23 0011 08/05/23 0528 08/05/23 0643  TROPONINIHS 258* 169* 130*   BNP: Recent Labs    08/04/23 1944  BNP 264.2*   D-Dimer: No results for input(s): "DDIMER" in the last 72 hours. Hemoglobin A1C: No results for input(s): "HGBA1C" in the last 72 hours. Fasting Lipid Panel: Recent Labs    08/04/23 1944  CHOL 120  HDL 41  LDLCALC 60  TRIG 95  CHOLHDL 2.9   Thyroid Function Tests: No results for input(s): "TSH", "T4TOTAL", "T3FREE", "THYROIDAB" in the last 72 hours.  Invalid input(s): "FREET3" Anemia Panel: No results for input(s): "VITAMINB12", "FOLATE", "FERRITIN", "TIBC", "IRON", "RETICCTPCT" in the last 72 hours.   Radiology: DG Chest 1 View  Result Date: 08/04/2023 CLINICAL DATA:  Central chest pain radiating to left arm, dizziness and nausea EXAM: CHEST  1 VIEW COMPARISON:  08/02/2023 FINDINGS: Single frontal view of the chest demonstrates an unremarkable cardiac silhouette. Postsurgical changes from median sternotomy. Implanted device within the left anterior chest  wall unchanged. No acute bony abnormalities. IMPRESSION: 1. Stable chest, no acute intrathoracic process. Electronically Signed   By: Sharlet Salina M.D.   On: 08/04/2023 20:28   DG Chest 2 View  Result Date: 08/03/2023 CLINICAL DATA:  Chest pain EXAM: CHEST - 2 VIEW COMPARISON:  07/21/2023 FINDINGS: Lungs are clear.  No pleural effusion or pneumothorax. The heart is normal in size. Postsurgical changes related to prior CABG. Median sternotomy.  Mild degenerative changes of the thoracic spine. IMPRESSION: Normal chest radiographs. Electronically Signed   By: Charline Bills M.D.   On: 08/03/2023 00:35   DG Chest 2 View  Result Date: 07/21/2023 CLINICAL DATA:  Shortness of breath EXAM: CHEST - 2 VIEW COMPARISON:  X-ray 07/13/2023 FINDINGS: There is some linear opacity at the left lung base likely scar or atelectasis. No consolidation, pneumothorax or effusion. No edema. Diffuse interstitial changes identified, possibly chronic. Status post median sternotomy with a calcified aorta. Normal cardiopericardial silhouette. Presumed coronary artery stents. Degenerative changes along the spine. Air-fluid level along the stomach beneath the left hemidiaphragm IMPRESSION: Postop chest. Diffuse interstitial changes, possibly chronic. Mild linear opacity left lung base. Atelectasis or scar is favored. Electronically Signed   By: Karen Kays M.D.   On: 07/21/2023 17:54   DG Chest Port 1 View  Result Date: 07/13/2023 CLINICAL DATA:  [id number] Hypoxia [id number] EXAM: PORTABLE CHEST - 1 VIEW COMPARISON:  07/11/2023 FINDINGS: Patchy airspace opacities at the left lung base previous minimally improved. Heart size and mediastinal contours are within normal limits. Aortic Atherosclerosis (ICD10-170.0). No effusion. Sternotomy wires. IMPRESSION:  Minimally improved left basilar airspace disease. Electronically Signed   By: Corlis Leak M.D.   On: 07/13/2023 15:57   DG Chest Port 1 View  Result Date: 07/11/2023 CLINICAL DATA:   Left-sided chest pain. Increased shortness of breath. Cough. COVID positive. EXAM: PORTABLE CHEST 1 VIEW COMPARISON:  Radiograph 2 days ago 07/09/2023 FINDINGS: Prior median sternotomy. Stable cardiomegaly post CABG. Chronic peribronchial thickening. Insert increase in patchy airspace disease at the left lung base. No pneumothorax or large pleural effusion. No acute osseous findings. IMPRESSION: 1. Increasing patchy airspace disease at the left lung base, atelectasis versus pneumonia. 2. Chronic peribronchial thickening. Stable cardiomegaly post CABG. Electronically Signed   By: Narda Rutherford M.D.   On: 07/11/2023 23:26   DG Chest 2 View  Result Date: 07/09/2023 CLINICAL DATA:  Chest pain. EXAM: CHEST - 2 VIEW COMPARISON:  Chest radiograph 06/15/2023, 06/12/2023, 04/27/2023 FINDINGS: Status post median sternotomy. Cardiac silhouette is again at the upper limits of normal size. Mediastinal contours are within limits with mild-to-moderate calcification within the aortic arch. There is again flattening of the diaphragms and mild-to-moderate hyperinflation. There is again cephalization of the pulmonary vasculature as can be seen with pulmonary venous hypertension. Minimal bilateral interstitial thickening is similar to 06/15/2023. Chronic left-greater-than-right basilar linear scarring is unchanged from multiple prior radiographs. No pleural effusion or pneumothorax. Moderate multilevel degenerative disc changes of the thoracic spine. Cholecystectomy clips. IMPRESSION: 1. No significant interval change. Findings suggestive of pulmonary venous hypertension. No overt pulmonary edema. 2. Chronic hyperinflation compatible with COPD. Electronically Signed   By: Neita Garnet M.D.   On: 07/09/2023 13:52    ECHO 05/01/2023:  1. Left ventricular ejection fraction, by estimation, is 50 to 55%. The  left ventricle has low normal function. The left ventricle has no regional  wall motion abnormalities. There is mild  concentric left ventricular  hypertrophy. Left ventricular diastolic parameters are consistent with Grade III diastolic dysfunction (restrictive).   2. Right ventricular systolic function is normal. The right ventricular  size is normal.   3. Left atrial size was mild to moderately dilated.   4. Right atrial size was mildly dilated.   5. The mitral valve is normal in structure. Trivial mitral valve regurgitation.   6. The aortic valve is normal in structure. Aortic valve regurgitation is trivial. Aortic valve sclerosis is present, with no evidence of aortic valve stenosis.   TELEMETRY reviewed by me [facility]) 08/05/2023 : sinus bradycardia  EKG reviewed by me: sinus bradycardia, no ischemia  Data reviewed by me [facility]) 08/05/2023: last 24h vitals tele labs imaging I/O provider notes  Active Problems:   HLD (hyperlipidemia)   Depression with anxiety   Chronic diastolic CHF (congestive heart failure) (HCC)   Paroxysmal atrial fibrillation (HCC)   NSTEMI (non-ST elevated myocardial infarction) (HCC)   Diabetes mellitus without complication (HCC)   HTN (hypertension)   CAD (coronary artery disease)    ASSESSMENT AND PLAN:  Lawrence [name] is a 67 y.o. male  with a past medical history of CAD (h/o STEMI s/p CABG x2 LIMA-LAD and SVG-OM1 2002 c/b coronary artery dissection with multiple subsequent PCI and stents), chronic stable angina, HFpEF (50-55%, mild LVH 10/2022) paroxysmal atrial fibrillation s/p unsuccessful DCCV 06/29/2022, COPD (PRN 2L), hx tobacco use, DM2  who presented to the ED on 08/04/2023 for chest pain. He was recently seen in the ED on 8/23 and was diagnosed with COVID at that time. Cardiology consulted for further evaluation of chest pain, rule out  ACS.    # Chest pain # CAD s/p CABG with chronic angina # Demand ischemia Patient with 1 day of worsening CP. Troponins elevated and flat, most consistent with demand/supply mismatch and not ACS. EKG nonacute. -Continue home ranexa  1000 mg bid, imdur 120 mg daily. -Continue home atorvastatin 80 mg daily and plavix 75 mg daily. -Continue lisinopril 20 mg daily. -No plan for further cardiac diagnostics at this time. -Discussed with Dr Juliann Pares, no plan for intervention of further cardiac testing at this time. -Patient is stable for discharge from cardiac standpoint and can follow-up with Dr Juliann Pares in office. Cardiology will sign off at this time.    # Paroxysmal atrial fibrillation -Continue home amiodarone 200 mg daily -Continue home metoprolol 12.5 mg daily. -Continue eliquis 5 mg twice daily for stroke risk reduction.     Signed: Clotilde Dieter, DO 08/05/2023, 8:34 AM [name] Cardiology

## 2023-08-06 ENCOUNTER — Other Ambulatory Visit: Payer: Self-pay

## 2023-08-06 ENCOUNTER — Telehealth: Payer: Self-pay

## 2023-08-06 ENCOUNTER — Ambulatory Visit: Payer: Self-pay | Admitting: *Deleted

## 2023-08-06 NOTE — Telephone Encounter (Signed)
  Chief Complaint: informational call, medication request Symptoms: patient initially called to inform PCP that he is changing cardiologist- he is not happy with his present provider. Patient also states that since he has decreased his xanax from 3/day to 1/day- he has had uncontrollable shaking. Patient is requesting to increase his dose back to 3/day  Disposition: [] ED /[] Urgent Care (no appt availability in office) / [] Appointment(In office/virtual)/ []  [place] [facility]/ [] Home Care/ [] Refused Recommended Disposition /[] [place] [facility]/ [x]  Follow-up with PCP Additional Notes: medication change requested- see notes. Patient advised his provider would get request and office would call him with response.

## 2023-08-06 NOTE — Telephone Encounter (Signed)
Copied from CRM [id number]. Topic: Quick Communication - Home Health Verbal Orders >> Aug 06, 2023 11:23 AM Everette C wrote: Caller/Agency: DeWana Team Asst [facility] has called to notify the practice that the patient will be seen for a PT evaluation on 08/09/23  Please contact further if needed

## 2023-08-06 NOTE — Patient Outreach (Signed)
Care Management   Outreach Note  08/06/2023 Name: Lawrence [name] MRN: [id number] DOB: 1956/04/01  An unsuccessful telephone outreach was attempted today to contact the patient about Care Management needs.     Follow Up Plan:  A HIPAA compliant phone message was left for the patient providing contact information and requesting a return call.   Felec ia [facility]  [facility], [facility] RN Care Manager Direct Dial: [phone] Website: [email or web address]

## 2023-08-06 NOTE — Telephone Encounter (Signed)
Pt states he has some news to tell Dr Sherrie Mustache, but he would prefer to tell his nurse.   Patient would like to let Dr Sherrie Mustache know that he is changing his heart care provider to [facility]. He is calling Monday for an appointment. He is not happy with his cardiologist he has now.  Patient also wants to let PCP know that he is not doing well with the decrease of xanax to only nightly. Patient states he was on 3 pills/day and now only one/hs. Patient states he is shaking all day.  Reason for Disposition  [1] Caller has URGENT medicine question about med that PCP or specialist prescribed AND [2] triager unable to answer question  Answer Assessment - Initial Assessment Questions 1. NAME of MEDICINE: "What medicine(s) are you calling about?"     xanax 2. QUESTION: "What is your question?" (e.g., double dose of medicine, side effect)     Patient would like to discuss increasing his xanax back to 3/day 3. PRESCRIBER: "Who prescribed the medicine?" Reason: if prescribed by specialist, call should be referred to that group.     PCP 4. SYMPTOMS: "Do you have any symptoms?" If Yes, ask: "What symptoms are you having?"  "How bad are the symptoms (e.g., mild, moderate, severe)     Patient has been only taking at night for 2 weeks and he reports he is shaking all day  Protocols used: Medication Question Call-A-AH

## 2023-08-07 ENCOUNTER — Other Ambulatory Visit: Payer: Self-pay

## 2023-08-07 ENCOUNTER — Emergency Department: Payer: Medicare HMO

## 2023-08-07 ENCOUNTER — Observation Stay
Admission: EM | Admit: 2023-08-07 | Discharge: 2023-08-08 | Disposition: A | Discharge status: Home / Self Care | Payer: Medicare HMO | Attending: Emergency Medicine | Admitting: Emergency Medicine

## 2023-08-07 DIAGNOSIS — I5043 Acute on chronic combined systolic (congestive) and diastolic (congestive) heart failure: Secondary | ICD-10-CM | POA: Diagnosis not present

## 2023-08-07 DIAGNOSIS — Z7902 Long term (current) use of antithrombotics/antiplatelets: Secondary | ICD-10-CM | POA: Diagnosis not present

## 2023-08-07 DIAGNOSIS — I10 Essential (primary) hypertension: Secondary | ICD-10-CM | POA: Diagnosis present

## 2023-08-07 DIAGNOSIS — R55 Syncope and collapse: Principal | ICD-10-CM | POA: Diagnosis present

## 2023-08-07 DIAGNOSIS — E119 Type 2 diabetes mellitus without complications: Secondary | ICD-10-CM

## 2023-08-07 DIAGNOSIS — J449 Chronic obstructive pulmonary disease, unspecified: Secondary | ICD-10-CM | POA: Diagnosis not present

## 2023-08-07 DIAGNOSIS — Z951 Presence of aortocoronary bypass graft: Secondary | ICD-10-CM | POA: Diagnosis not present

## 2023-08-07 DIAGNOSIS — Z87891 Personal history of nicotine dependence: Secondary | ICD-10-CM | POA: Diagnosis not present

## 2023-08-07 DIAGNOSIS — R079 Chest pain, unspecified: Secondary | ICD-10-CM | POA: Diagnosis not present

## 2023-08-07 DIAGNOSIS — I110 Hypertensive heart disease with heart failure: Secondary | ICD-10-CM | POA: Diagnosis not present

## 2023-08-07 DIAGNOSIS — I25118 Atherosclerotic heart disease of native coronary artery with other forms of angina pectoris: Secondary | ICD-10-CM | POA: Diagnosis not present

## 2023-08-07 DIAGNOSIS — Z7984 Long term (current) use of oral hypoglycemic drugs: Secondary | ICD-10-CM | POA: Diagnosis not present

## 2023-08-07 DIAGNOSIS — I482 Chronic atrial fibrillation, unspecified: Secondary | ICD-10-CM | POA: Diagnosis not present

## 2023-08-07 DIAGNOSIS — Z955 Presence of coronary angioplasty implant and graft: Secondary | ICD-10-CM | POA: Diagnosis not present

## 2023-08-07 DIAGNOSIS — Z7901 Long term (current) use of anticoagulants: Secondary | ICD-10-CM | POA: Diagnosis not present

## 2023-08-07 DIAGNOSIS — J45909 Unspecified asthma, uncomplicated: Secondary | ICD-10-CM | POA: Diagnosis not present

## 2023-08-07 DIAGNOSIS — R0789 Other chest pain: Secondary | ICD-10-CM | POA: Diagnosis not present

## 2023-08-07 DIAGNOSIS — Z79899 Other long term (current) drug therapy: Secondary | ICD-10-CM | POA: Insufficient documentation

## 2023-08-07 DIAGNOSIS — Z85828 Personal history of other malignant neoplasm of skin: Secondary | ICD-10-CM | POA: Insufficient documentation

## 2023-08-07 DIAGNOSIS — I480 Paroxysmal atrial fibrillation: Secondary | ICD-10-CM | POA: Diagnosis present

## 2023-08-07 DIAGNOSIS — J9811 Atelectasis: Secondary | ICD-10-CM | POA: Diagnosis not present

## 2023-08-07 DIAGNOSIS — I509 Heart failure, unspecified: Secondary | ICD-10-CM

## 2023-08-07 DIAGNOSIS — I4891 Unspecified atrial fibrillation: Secondary | ICD-10-CM | POA: Diagnosis not present

## 2023-08-07 DIAGNOSIS — R0902 Hypoxemia: Secondary | ICD-10-CM | POA: Diagnosis not present

## 2023-08-07 LAB — COMPREHENSIVE METABOLIC PANEL
ALT: 15 U/L (ref 0–44)
AST: 16 U/L (ref 15–41)
Albumin: 3.4 g/dL — ABNORMAL LOW (ref 3.5–5.0)
Alkaline Phosphatase: 58 U/L (ref 38–126)
Anion gap: 12 (ref 5–15)
BUN: 17 mg/dL (ref 8–23)
CO2: 24 mmol/L (ref 22–32)
Calcium: 9.6 mg/dL (ref 8.9–10.3)
Chloride: 103 mmol/L (ref 98–111)
Creatinine, Ser: 0.84 mg/dL (ref 0.61–1.24)
GFR, Estimated: >60 mL/min (ref >60)
Glucose, Bld: 251 mg/dL — ABNORMAL HIGH (ref 70–99)
Potassium: 3.6 mmol/L (ref 3.5–5.1)
Sodium: 139 mmol/L (ref 135–145)
Total Bilirubin: 0.6 mg/dL (ref 0.3–1.2)
Total Protein: 6.3 g/dL — ABNORMAL LOW (ref 6.5–8.1)

## 2023-08-07 LAB — CBC WITH DIFFERENTIAL/PLATELET
Abs Immature Granulocytes: 0.04 10*3/uL (ref 0.00–0.07)
Basophils Absolute: 0 10*3/uL (ref 0.0–0.1)
Basophils Relative: 0 %
Eosinophils Absolute: 0 10*3/uL (ref 0.0–0.5)
Eosinophils Relative: 0 %
HCT: 41 % (ref 39.0–52.0)
Hemoglobin: 13 g/dL (ref 13.0–17.0)
Immature Granulocytes: 0 %
Lymphocytes Relative: 8 %
Lymphs Abs: 0.8 10*3/uL (ref 0.7–4.0)
MCH: 29.5 pg (ref 26.0–34.0)
MCHC: 31.7 g/dL (ref 30.0–36.0)
MCV: 93 fL (ref 80.0–100.0)
Monocytes Absolute: 0.4 10*3/uL (ref 0.1–1.0)
Monocytes Relative: 4 %
Neutro Abs: 8.4 10*3/uL — ABNORMAL HIGH (ref 1.7–7.7)
Neutrophils Relative %: 88 %
Platelets: 164 10*3/uL (ref 150–400)
RBC: 4.41 MIL/uL (ref 4.22–5.81)
RDW: 15.7 % — ABNORMAL HIGH (ref 11.5–15.5)
WBC: 9.7 10*3/uL (ref 4.0–10.5)
nRBC: 0 % (ref 0.0–0.2)

## 2023-08-07 LAB — CBC
HCT: 40.1 % (ref 39.0–52.0)
Hemoglobin: 12.7 g/dL — ABNORMAL LOW (ref 13.0–17.0)
MCH: 29.5 pg (ref 26.0–34.0)
MCHC: 31.7 g/dL (ref 30.0–36.0)
MCV: 93.3 fL (ref 80.0–100.0)
Platelets: 158 10*3/uL (ref 150–400)
RBC: 4.3 MIL/uL (ref 4.22–5.81)
RDW: 15.9 % — ABNORMAL HIGH (ref 11.5–15.5)
WBC: 9.2 10*3/uL (ref 4.0–10.5)
nRBC: 0 % (ref 0.0–0.2)

## 2023-08-07 LAB — TROPONIN I (HIGH SENSITIVITY)
Troponin I (High Sensitivity): 84 ng/L — ABNORMAL HIGH (ref <18)
Troponin I (High Sensitivity): 98 ng/L — ABNORMAL HIGH (ref <18)
Troponin I (High Sensitivity): 94 ng/L — ABNORMAL HIGH (ref <18)
Troponin I (High Sensitivity): 86 ng/L — ABNORMAL HIGH (ref <18)

## 2023-08-07 LAB — GLUCOSE, CAPILLARY
Glucose-Capillary: 109 mg/dL — ABNORMAL HIGH (ref 70–99)
Glucose-Capillary: 111 mg/dL — ABNORMAL HIGH (ref 70–99)
Glucose-Capillary: 115 mg/dL — ABNORMAL HIGH (ref 70–99)

## 2023-08-07 LAB — BRAIN NATRIURETIC PEPTIDE: B Natriuretic Peptide: 398.3 pg/mL — ABNORMAL HIGH (ref 0.0–100.0)

## 2023-08-07 LAB — CREATININE, SERUM
Creatinine, Ser: 0.72 mg/dL (ref 0.61–1.24)
GFR, Estimated: >60 mL/min (ref >60)

## 2023-08-07 LAB — MAGNESIUM: Magnesium: 1.9 mg/dL (ref 1.7–2.4)

## 2023-08-07 MED ORDER — PANTOPRAZOLE SODIUM 40 MG PO TBEC
40.0000 mg | DELAYED_RELEASE_TABLET | Freq: Two times a day (BID) | ORAL | Status: DC
  Administered 2023-08-07 – 2023-08-08 (×3): 40 mg via ORAL
  Filled 2023-08-07 (×3): qty 1

## 2023-08-07 MED ORDER — ALLOPURINOL 100 MG PO TABS
300.0000 mg | ORAL_TABLET | Freq: Two times a day (BID) | ORAL | Status: DC
  Administered 2023-08-07 – 2023-08-08 (×3): 300 mg via ORAL
  Filled 2023-08-07 (×3): qty 3

## 2023-08-07 MED ORDER — BUDESONIDE 0.25 MG/2ML IN SUSP
0.2500 mg | Freq: Two times a day (BID) | RESPIRATORY_TRACT | Status: DC
  Administered 2023-08-07 – 2023-08-08 (×2): 0.25 mg via RESPIRATORY_TRACT
  Filled 2023-08-07 (×2): qty 2

## 2023-08-07 MED ORDER — LORATADINE 10 MG PO TABS
10.0000 mg | ORAL_TABLET | Freq: Every day | ORAL | Status: DC
  Administered 2023-08-07 – 2023-08-08 (×2): 10 mg via ORAL
  Filled 2023-08-07 (×2): qty 1

## 2023-08-07 MED ORDER — ENOXAPARIN SODIUM 40 MG/0.4ML IJ SOSY
40.0000 mg | PREFILLED_SYRINGE | INTRAMUSCULAR | Status: DC
  Administered 2023-08-07: 40 mg via SUBCUTANEOUS
  Filled 2023-08-07: qty 0.4

## 2023-08-07 MED ORDER — NITROGLYCERIN 0.4 MG SL SUBL: 0.4000 mg | SUBLINGUAL_TABLET | SUBLINGUAL | Status: DC | PRN

## 2023-08-07 MED ORDER — MORPHINE SULFATE (PF) 2 MG/ML IV SOLN
2.0000 mg | INTRAVENOUS | Status: DC | PRN
  Administered 2023-08-07 – 2023-08-08 (×11): 2 mg via INTRAVENOUS
  Filled 2023-08-07 (×11): qty 1

## 2023-08-07 MED ORDER — UMECLIDINIUM-VILANTEROL 62.5-25 MCG/ACT IN AEPB
1.0000 | INHALATION_SPRAY | Freq: Every day | RESPIRATORY_TRACT | Status: DC
  Administered 2023-08-08: 1 via RESPIRATORY_TRACT
  Filled 2023-08-07 (×2): qty 14

## 2023-08-07 MED ORDER — TRAZODONE HCL 50 MG PO TABS: 25.0000 mg | ORAL_TABLET | Freq: Every evening | ORAL | Status: DC | PRN

## 2023-08-07 MED ORDER — NITROGLYCERIN 2 % TD OINT
1.0000 [in_us] | TOPICAL_OINTMENT | Freq: Once | TRANSDERMAL | Status: AC
  Administered 2023-08-07: 1 [in_us] via TOPICAL
  Filled 2023-08-07: qty 1

## 2023-08-07 MED ORDER — APIXABAN 5 MG PO TABS
5.0000 mg | ORAL_TABLET | Freq: Two times a day (BID) | ORAL | Status: DC
  Administered 2023-08-07 – 2023-08-08 (×3): 5 mg via ORAL
  Filled 2023-08-07 (×3): qty 1

## 2023-08-07 MED ORDER — INSULIN ASPART 100 UNIT/ML IJ SOLN: 0.0000 [IU] | Freq: Three times a day (TID) | INTRAMUSCULAR | Status: DC

## 2023-08-07 MED ORDER — ATORVASTATIN CALCIUM 80 MG PO TABS
80.0000 mg | ORAL_TABLET | Freq: Every day | ORAL | Status: DC
  Administered 2023-08-07: 80 mg via ORAL
  Filled 2023-08-07: qty 1

## 2023-08-07 MED ORDER — ISOSORBIDE MONONITRATE ER 60 MG PO TB24
120.0000 mg | ORAL_TABLET | Freq: Every day | ORAL | Status: DC
  Administered 2023-08-07 – 2023-08-08 (×2): 120 mg via ORAL
  Filled 2023-08-07 (×2): qty 2

## 2023-08-07 MED ORDER — GLIPIZIDE 5 MG PO TABS: 5.0000 mg | ORAL_TABLET | Freq: Every day | ORAL | Status: DC | PRN

## 2023-08-07 MED ORDER — RANOLAZINE ER 500 MG PO TB12
1000.0000 mg | ORAL_TABLET | Freq: Two times a day (BID) | ORAL | Status: DC
  Administered 2023-08-07 – 2023-08-08 (×3): 1000 mg via ORAL
  Filled 2023-08-07 (×3): qty 2

## 2023-08-07 MED ORDER — CLOPIDOGREL BISULFATE 75 MG PO TABS
75.0000 mg | ORAL_TABLET | Freq: Every day | ORAL | Status: DC
  Administered 2023-08-07 – 2023-08-08 (×2): 75 mg via ORAL
  Filled 2023-08-07 (×2): qty 1

## 2023-08-07 MED ORDER — LISINOPRIL 20 MG PO TABS
20.0000 mg | ORAL_TABLET | Freq: Every day | ORAL | Status: DC
  Administered 2023-08-07 – 2023-08-08 (×2): 20 mg via ORAL
  Filled 2023-08-07 (×2): qty 1

## 2023-08-07 MED ORDER — MORPHINE SULFATE (PF) 4 MG/ML IV SOLN
4.0000 mg | Freq: Once | INTRAVENOUS | Status: DC
  Filled 2023-08-07: qty 1

## 2023-08-07 MED ORDER — POLYSACCHARIDE IRON COMPLEX 150 MG PO CAPS
150.0000 mg | ORAL_CAPSULE | Freq: Every day | ORAL | Status: DC
  Administered 2023-08-07 – 2023-08-08 (×2): 150 mg via ORAL
  Filled 2023-08-07 (×2): qty 1

## 2023-08-07 MED ORDER — METFORMIN HCL ER 500 MG PO TB24
500.0000 mg | ORAL_TABLET | Freq: Two times a day (BID) | ORAL | Status: DC
  Administered 2023-08-08: 500 mg via ORAL
  Filled 2023-08-07 (×2): qty 1

## 2023-08-07 MED ORDER — ASPIRIN 81 MG PO TBEC
81.0000 mg | DELAYED_RELEASE_TABLET | Freq: Every day | ORAL | Status: DC
  Administered 2023-08-07 – 2023-08-08 (×2): 81 mg via ORAL
  Filled 2023-08-07 (×2): qty 1

## 2023-08-07 MED ORDER — OXYCODONE HCL 5 MG PO TABS
5.0000 mg | ORAL_TABLET | Freq: Once | ORAL | Status: AC
  Administered 2023-08-07: 5 mg via ORAL
  Filled 2023-08-07: qty 1

## 2023-08-07 MED ORDER — DICYCLOMINE HCL 10 MG PO CAPS: 10.0000 mg | ORAL_CAPSULE | Freq: Three times a day (TID) | ORAL | Status: DC | PRN

## 2023-08-07 MED ORDER — AMLODIPINE BESYLATE 5 MG PO TABS
5.0000 mg | ORAL_TABLET | Freq: Every day | ORAL | Status: DC
  Administered 2023-08-07 – 2023-08-08 (×2): 5 mg via ORAL
  Filled 2023-08-07 (×2): qty 1

## 2023-08-07 MED ORDER — LIDOCAINE 5 % EX PTCH
1.0000 | MEDICATED_PATCH | CUTANEOUS | Status: DC
  Administered 2023-08-07 – 2023-08-08 (×2): 1 via TRANSDERMAL
  Filled 2023-08-07 (×2): qty 1

## 2023-08-07 MED ORDER — ALPRAZOLAM 0.5 MG PO TABS: 1.0000 mg | ORAL_TABLET | Freq: Three times a day (TID) | ORAL | Status: DC | PRN

## 2023-08-07 MED ORDER — CLONAZEPAM 0.5 MG PO TABS
0.5000 mg | ORAL_TABLET | Freq: Two times a day (BID) | ORAL | Status: DC
  Administered 2023-08-07 – 2023-08-08 (×3): 0.5 mg via ORAL
  Filled 2023-08-07 (×3): qty 1

## 2023-08-07 MED ORDER — MAGNESIUM HYDROXIDE 400 MG/5ML PO SUSP: 30.0000 mL | Freq: Every day | ORAL | Status: DC | PRN

## 2023-08-07 MED ORDER — ALPRAZOLAM 0.25 MG PO TABS: 0.2500 mg | ORAL_TABLET | Freq: Two times a day (BID) | ORAL | Status: DC | PRN

## 2023-08-07 MED ORDER — ACETAMINOPHEN 325 MG PO TABS: 650.0000 mg | ORAL_TABLET | ORAL | Status: DC | PRN

## 2023-08-07 MED ORDER — OXYCODONE-ACETAMINOPHEN 5-325 MG PO TABS
2.0000 | ORAL_TABLET | ORAL | Status: DC | PRN
  Administered 2023-08-07: 2 via ORAL
  Filled 2023-08-07: qty 2

## 2023-08-07 MED ORDER — DAPAGLIFLOZIN PROPANEDIOL 10 MG PO TABS
10.0000 mg | ORAL_TABLET | Freq: Every day | ORAL | Status: DC
  Administered 2023-08-07: 10 mg via ORAL
  Filled 2023-08-07: qty 1

## 2023-08-07 MED ORDER — ACETAMINOPHEN 500 MG PO TABS
1000.0000 mg | ORAL_TABLET | Freq: Once | ORAL | Status: AC
  Administered 2023-08-07: 1000 mg via ORAL
  Filled 2023-08-07: qty 2

## 2023-08-07 MED ORDER — METHOCARBAMOL 500 MG PO TABS
500.0000 mg | ORAL_TABLET | Freq: Three times a day (TID) | ORAL | Status: DC | PRN
  Administered 2023-08-07: 500 mg via ORAL
  Filled 2023-08-07: qty 1

## 2023-08-07 MED ORDER — TRAZODONE HCL 50 MG PO TABS
150.0000 mg | ORAL_TABLET | Freq: Every day | ORAL | Status: DC
  Administered 2023-08-07: 150 mg via ORAL
  Filled 2023-08-07: qty 1

## 2023-08-07 MED ORDER — SODIUM CHLORIDE 0.9% FLUSH
3.0000 mL | Freq: Two times a day (BID) | INTRAVENOUS | Status: DC
  Administered 2023-08-07 – 2023-08-08 (×2): 3 mL via INTRAVENOUS

## 2023-08-07 MED ORDER — ONDANSETRON HCL 4 MG/2ML IJ SOLN: 4.0000 mg | Freq: Four times a day (QID) | INTRAMUSCULAR | Status: DC | PRN

## 2023-08-07 MED ORDER — MORPHINE SULFATE (PF) 4 MG/ML IV SOLN
4.0000 mg | Freq: Once | INTRAVENOUS | Status: AC
  Administered 2023-08-07: 4 mg via INTRAVENOUS

## 2023-08-07 MED ORDER — BUDESON-GLYCOPYRROL-FORMOTEROL 160-9-4.8 MCG/ACT IN AERO: 2.0000 | INHALATION_SPRAY | Freq: Two times a day (BID) | RESPIRATORY_TRACT | Status: DC

## 2023-08-07 MED ORDER — SODIUM CHLORIDE 0.9 % IV SOLN: INTRAVENOUS | Status: DC

## 2023-08-07 NOTE — Consult Note (Addendum)
Lawrence [name] is a 68 y.o. male  [id number]  Primary Cardiologist: [facility] cardiology Reason for Consultation: Chest pain and syncope  HPI: 67 year old white male with a past medical history of chronic atrial fibrillation status post CABG 2002, presented to the hospital with chest pain and shortness of breath.  Patient states last week he passed out and again passed out last night.  He was seen at [facility] and was placed on Zio patch as he had severe bradycardia.  His amiodarone and metoprolol were held.  Now presented with chest pain to the hospital.   Review of Systems: No orthopnea PND leg swelling   Past Medical History:  Diagnosis Date   A-fib (HCC)    Anemia    Anginal pain (HCC)    Anxiety    Arthritis    Asthma    CAD (coronary artery disease)    a. 2002 CABGx2 (LIMA->LAD, VG->VG->OM1);  b. 09/2012 DES->OM;  c. 03/2015 PTCA of LAD [facility]) in setting of atretic LIMA; d. 05/2015 Cath [facility]): nonobs dzs; e. 06/2015 Cath ([facility]): LM nl, LAD 45p/d ISR, 50d, D1/2 small, LCX 50p/d ISR, OM1 70ost, 30 ISR, VG->OM1 50ost, 23m, LIMA->LAD 99p/d - atretic, RCA dom, nl; f.cath 10/16: 40-50%(FFR 0.90) pLAD, 75% (FFR 0.77) mLAD s/p PCI/DES, oRCA 40% (FFR0.95)   Cancer (HCC)    SKIN CANCER ON BACK   Celiac disease    Chronic diastolic CHF (congestive heart failure) (HCC)    a. 06/2009 Echo: EF 60-65%, Gr 1 DD, triv AI, mildly dil LA, nl RV.   COPD (chronic obstructive pulmonary disease) (HCC)    a. Chronic bronchitis and emphysema.   DDD (degenerative disc disease), lumbar    Diverticulosis    Dysrhythmia    Essential hypertension    GERD (gastroesophageal reflux disease)    History of hiatal hernia    History of kidney stones    H/O   History of tobacco abuse    a. Quit 2014.   Myocardial infarction [facility]) 2002   4 STENTS   Pancreatitis    PSVT (paroxysmal supraventricular tachycardia)    a. 10/2012 Noted on Zio Patch.   Sleep apnea    LOST CORD TO CPAP -ONLY 02 @ BEDTIME    Tubular adenoma of colon    Type II diabetes mellitus (HCC)     Medications Prior to Admission  Medication Sig Dispense Refill   allopurinol (ZYLOPRIM) 300 MG tablet TAKE 1 TABLET BY MOUTH TWICE A DAY 180 tablet 4   ALPRAZolam (XANAX) 1 MG tablet Take 1 tablet (1 mg total) by mouth 3 (three) times daily as needed. (Patient taking differently: Take 1 mg by mouth 3 (three) times daily.) 90 tablet 3   amLODipine (NORVASC) 5 MG tablet Take 1 tablet by mouth daily.     apixaban (ELIQUIS) 5 MG TABS tablet Take 1 tablet (5 mg total) by mouth 2 (two) times daily. 60 tablet 0   atorvastatin (LIPITOR) 80 MG tablet TAKE 1 TABLET BY MOUTH AT BEDTIME 90 tablet 4   Budeson-Glycopyrrol-Formoterol (BREZTRI AEROSPHERE) 160-9-4.8 MCG/ACT AERO Inhale 2 puffs into the lungs 2 (two) times daily. 10.7 g 11   cetirizine (ZYRTEC) 10 MG tablet TAKE 1 TABLET BY MOUTH AT BEDTIME 90 tablet 1   clonazePAM (KLONOPIN) 0.5 MG tablet Take 0.5 mg by mouth 2 (two) times daily.     clopidogrel (PLAVIX) 75 MG tablet Take 1 tablet (75 mg total) by mouth daily. 30 tablet 0  FARXIGA 10 MG TABS tablet Take 10 mg by mouth daily.     iron polysaccharides (NIFEREX) 150 MG capsule Take 1 capsule (150 mg total) by mouth daily. 30 capsule 1   isosorbide mononitrate (IMDUR) 120 MG 24 hr tablet Take 1 tablet (120 mg total) by mouth daily. 30 tablet 0   lisinopril (ZESTRIL) 20 MG tablet Take 20 mg by mouth daily.     metFORMIN (GLUCOPHAGE-XR) 500 MG 24 hr tablet Take 1 tablet (500 mg total) by mouth 2 (two) times daily. (Patient taking differently: Take 500 mg by mouth daily.) 60 tablet 0   omega-3 acid ethyl esters (LOVAZA) 1 g capsule TAKE 4 CAPSULES (4 GRAMS TOTAL) BY MOUTHDAILY 120 capsule 3   pantoprazole (PROTONIX) 40 MG tablet Take 1 tablet (40 mg total) by mouth 2 (two) times daily for 14 days. 28 tablet 0   ranolazine (RANEXA) 1000 MG SR tablet Take 1 tablet (1,000 mg total) by mouth 2 (two) times daily. 60 tablet 0   traZODone  (DESYREL) 150 MG tablet TAKE 1 TABLET BY MOUTH AT BEDTIME (Patient taking differently: Take 100 mg by mouth at bedtime.) 90 tablet 4   Accu-Chek Softclix Lancets lancets Use as instructed to check sugar daily for type 2 diabetes. 100 each 4   albuterol (VENTOLIN HFA) 108 (90 Base) MCG/ACT inhaler INHALE 2 PUFFS BY MOUTH EVERY 6 HOURS AS NEEDED FOR SHORTNESS OF BREATH (Patient not taking: Reported on 08/05/2023) 17 g 5   Blood Glucose Calibration (ACCU-CHEK GUIDE CONTROL) LIQD Use with blood glucose monitor as directed 1 each 3   Blood Glucose Monitoring Suppl (ACCU-CHEK GUIDE) w/Device KIT Use to check blood sugars as directed 1 kit 0   dicyclomine (BENTYL) 10 MG capsule Take 1 capsule (10 mg total) by mouth 3 (three) times daily as needed (abdominal pain). 15 capsule 0   glipiZIDE (GLUCOTROL) 5 MG tablet Take 1 tablet (5 mg total) by mouth daily as needed (sugar over 300). 30 tablet 0   glucose blood (ACCU-CHEK GUIDE) test strip Use as instructed to check sugar daily for type 2 diabetes. 100 strip 4   methocarbamol (ROBAXIN) 500 MG tablet TAKE 1 TABLET BY MOUTH EVERY 8 HOURS AS NEEDED FOR MUSCLE SPASMS 90 tablet 1   oxyCODONE-acetaminophen (PERCOCET) 10-325 MG tablet TAKE 1 TABLET BY MOUTH EVERY 4 HOURS AS NEEDED FOR PAIN 180 tablet 0   venlafaxine XR (EFFEXOR-XR) 75 MG 24 hr capsule TAKE 1 CAPSULE BY MOUTH DAILY WITH BREAKFAST (Patient not taking: Reported on 08/05/2023) 90 capsule 4      allopurinol  300 mg Oral BID   amLODipine  5 mg Oral Daily   apixaban  5 mg Oral BID   aspirin EC  81 mg Oral Daily   atorvastatin  80 mg Oral QHS   budesonide (PULMICORT) nebulizer solution  0.25 mg Nebulization BID   clonazePAM  0.5 mg Oral BID   clopidogrel  75 mg Oral Daily   enoxaparin (LOVENOX) injection  40 mg Subcutaneous Q24H   insulin aspart  0-6 Units Subcutaneous TID WC   iron polysaccharides  150 mg Oral Daily   isosorbide mononitrate  120 mg Oral Daily   lidocaine  1 patch Transdermal Q24H    lisinopril  20 mg Oral Daily   loratadine  10 mg Oral Daily   [START ON 08/08/2023] metFORMIN  500 mg Oral BID WC   pantoprazole  40 mg Oral BID   ranolazine  1,000 mg Oral BID  sodium chloride flush  3 mL Intravenous Q12H   traZODone  150 mg Oral QHS   [START ON 08/08/2023] umeclidinium-vilanterol  1 puff Inhalation Daily    Infusions:  sodium chloride      Allergies  Allergen Reactions   Prednisone Other (See Comments) and Hypertension    Pt states that this medication puts him in A-fib    Demerol  [Meperidine Hcl]    Demerol [Meperidine] Hives   Jardiance [Empagliflozin] Other (See Comments)    Perineal pain   Sulfa Antibiotics Hives   Albuterol Sulfate [Albuterol] Palpitations and Other (See Comments)    Pt currently uses this medication.     Morphine Sulfate Nausea And Vomiting, Rash and Other (See Comments)    Pt states that he is only allergic to the tablet form of this medication.      Social History   Socioeconomic History   Marital status: Widowed    Spouse name: Not on file   Number of children: 1   Years of education: Not on file   Highest education level: 10th grade  Occupational History   Occupation: Disabled   Occupation: on Tree surgeon  Tobacco Use   Smoking status: Former    Current packs/day: 0.00    Average packs/day: 3.0 packs/day for 50.0 years (150.0 ttl pk-yrs)    Types: Cigarettes    Start date: 04/23/1963    Quit date: 04/22/2013    Years since quitting: 10.2   Smokeless tobacco: Never   Tobacco comments:    Reports not smoking for approx 8 years.  Vaping Use   Vaping status: Never Used  Substance and Sexual Activity   Alcohol use: No    Comment: remotely quit alcohol use. Hx of heavy alcohol use.   Drug use: Not Currently    Types: Marijuana   Sexual activity: Not Currently  Other Topics Concern   Not on file  Social History Narrative   Pt lives in [place] with wife.  Does not routinely exercise.   Social Determinants of  Health   Financial Resource Strain: Low Risk  (03/10/2023)   Overall Financial Resource Strain (CARDIA)    Difficulty of Paying Living Expenses: Not hard at all  Food Insecurity: No Food Insecurity (08/06/2023)   Hunger Vital Sign    Worried About Running Out of Food in the Last Year: Never true    Ran Out of Food in the Last Year: Never true  Transportation Needs: No Transportation Needs (08/06/2023)   PRAPARE - Administrator, Civil Service (Medical): No    Lack of Transportation (Non-Medical): No  Physical Activity: Inactive (03/10/2023)   Exercise Vital Sign    Days of Exercise per Week: 0 days    Minutes of Exercise per Session: 0 min  Stress: No Stress Concern Present (03/10/2023)   [employer] of Occupational Health - Occupational Stress Questionnaire    Feeling of Stress : Not at all  Social Connections: Moderately Isolated (08/06/2023)   Social Connection and Isolation Panel [NHANES]    Frequency of Communication with Friends and Family: More than three times a week    Frequency of Social Gatherings with Friends and Family: More than three times a week    Attends Religious Services: More than 4 times per year    Active Member of [employer] or Organizations: No    Attends Banker Meetings: Not on file    Marital Status: Widowed  Intimate Partner Violence: Not At Risk (07/13/2023)  Humiliation, Afraid, Rape, and Kick questionnaire    Fear of Current or Ex-Partner: No    Emotionally Abused: No    Physically Abused: No    Sexually Abused: No    Family History  Problem Relation Age of Onset   Heart attack Mother    Depression Mother    Heart disease Mother    COPD Mother    Hypertension Mother    Heart attack Father    Diabetes Father    Depression Father    Heart disease Father    Cirrhosis Father    Parkinson's disease Brother     PHYSICAL EXAM: Vitals:   08/07/23 0812 08/07/23 0812  BP: 138/89 138/89  Pulse: 67 67  Resp: 16 16  Temp:  97.6 F (36.4 C) 97.6 F (36.4 C)  SpO2: 97% 97%     Intake/Output Summary (Last 24 hours) at 08/07/2023 1143 Last data filed at 08/07/2023 0813 Gross per 24 hour  Intake --  Output 225 ml  Net -225 ml    General:  Well appearing. No respiratory difficulty HEENT: normal Neck: supple. no JVD. Carotids 2+ bilat; no bruits. No lymphadenopathy or thryomegaly appreciated. Cor: PMI nondisplaced. Regular rate & rhythm. No rubs, gallops or murmurs. Lungs: clear Abdomen: soft, nontender, nondistended. No hepatosplenomegaly. No bruits or masses. Good bowel sounds. Extremities: no cyanosis, clubbing, rash, edema Neuro: alert & oriented x 3, cranial nerves grossly intact. moves all 4 extremities w/o difficulty. Affect pleasant.  ECG: Atrial fibrillation with controlled ventricular rate about 75/min with old inferior and anterior wall MI and nonspecific ST-T changes  Results for orders placed or performed during the hospital encounter of 08/07/23 (from the past 24 hour(s))  Comprehensive metabolic panel     Status: Abnormal   Collection Time: 08/07/23 12:52 AM  Result Value Ref Range   Sodium 139 135 - 145 mmol/L   Potassium 3.6 3.5 - 5.1 mmol/L   Chloride 103 98 - 111 mmol/L   CO2 24 22 - 32 mmol/L   Glucose, Bld 251 (H) 70 - 99 mg/dL   BUN 17 8 - 23 mg/dL   Creatinine, Ser 0.98 0.61 - 1.24 mg/dL   Calcium 9.6 8.9 - 11.9 mg/dL   Total Protein 6.3 (L) 6.5 - 8.1 g/dL   Albumin 3.4 (L) 3.5 - 5.0 g/dL   AST 16 15 - 41 U/L   ALT 15 0 - 44 U/L   Alkaline Phosphatase 58 38 - 126 U/L   Total Bilirubin 0.6 0.3 - 1.2 mg/dL   GFR, Estimated >14 >78 mL/min   Anion gap 12 5 - 15  CBC with Differential/Platelet     Status: Abnormal   Collection Time: 08/07/23 12:52 AM  Result Value Ref Range   WBC 9.7 4.0 - 10.5 K/uL   RBC 4.41 4.22 - 5.81 MIL/uL   Hemoglobin 13.0 13.0 - 17.0 g/dL   HCT 29.5 62.1 - 30.8 %   MCV 93.0 80.0 - 100.0 fL   MCH 29.5 26.0 - 34.0 pg   MCHC 31.7 30.0 - 36.0 g/dL    RDW 65.7 (H) 84.6 - 15.5 %   Platelets 164 150 - 400 K/uL   nRBC 0.0 0.0 - 0.2 %   Neutrophils Relative % 88 %   Neutro Abs 8.4 (H) 1.7 - 7.7 K/uL   Lymphocytes Relative 8 %   Lymphs Abs 0.8 0.7 - 4.0 K/uL   Monocytes Relative 4 %   Monocytes Absolute 0.4 0.1 - 1.0 K/uL  Eosinophils Relative 0 %   Eosinophils Absolute 0.0 0.0 - 0.5 K/uL   Basophils Relative 0 %   Basophils Absolute 0.0 0.0 - 0.1 K/uL   Immature Granulocytes 0 %   Abs Immature Granulocytes 0.04 0.00 - 0.07 K/uL  Brain natriuretic peptide     Status: Abnormal   Collection Time: 08/07/23 12:52 AM  Result Value Ref Range   B Natriuretic Peptide 398.3 (H) 0.0 - 100.0 pg/mL  Troponin I (High Sensitivity)     Status: Abnormal   Collection Time: 08/07/23 12:52 AM  Result Value Ref Range   Troponin I (High Sensitivity) 84 (H) <18 ng/L  Magnesium     Status: None   Collection Time: 08/07/23 12:52 AM  Result Value Ref Range   Magnesium 1.9 1.7 - 2.4 mg/dL  Troponin I (High Sensitivity)     Status: Abnormal   Collection Time: 08/07/23  2:40 AM  Result Value Ref Range   Troponin I (High Sensitivity) 98 (H) <18 ng/L  CBC     Status: Abnormal   Collection Time: 08/07/23  5:46 AM  Result Value Ref Range   WBC 9.2 4.0 - 10.5 K/uL   RBC 4.30 4.22 - 5.81 MIL/uL   Hemoglobin 12.7 (L) 13.0 - 17.0 g/dL   HCT 47.8 29.5 - 62.1 %   MCV 93.3 80.0 - 100.0 fL   MCH 29.5 26.0 - 34.0 pg   MCHC 31.7 30.0 - 36.0 g/dL   RDW 30.8 (H) 65.7 - 84.6 %   Platelets 158 150 - 400 K/uL   nRBC 0.0 0.0 - 0.2 %  Creatinine, serum     Status: None   Collection Time: 08/07/23  5:46 AM  Result Value Ref Range   Creatinine, Ser 0.72 0.61 - 1.24 mg/dL   GFR, Estimated >96 >29 mL/min  Troponin I (High Sensitivity)     Status: Abnormal   Collection Time: 08/07/23  5:46 AM  Result Value Ref Range   Troponin I (High Sensitivity) 94 (H) <18 ng/L  Troponin I (High Sensitivity)     Status: Abnormal   Collection Time: 08/07/23  7:40 AM  Result Value  Ref Range   Troponin I (High Sensitivity) 86 (H) <18 ng/L   *Note: Due to a large number of results and/or encounters for the requested time period, some results have not been displayed. A complete set of results can be found in Results Review.   DG Chest 1 View  Result Date: 08/07/2023 CLINICAL DATA:  Chest pain. EXAM: CHEST  1 VIEW COMPARISON:  August 04, 2023 FINDINGS: Multiple sternal wires and vascular clips are seen. The heart size and mediastinal contours are within normal limits. Mild atelectasis is seen within the left lung base. No pleural effusion or pneumothorax is identified. Multilevel degenerative changes seen throughout the thoracic spine. IMPRESSION: 1. Evidence of prior median sternotomy/CABG. 2. Mild left basilar atelectasis. Electronically Signed   By: Aram Candela M.D.   On: 08/07/2023 01:30     ASSESSMENT AND PLAN: History of syncope with prior history A-fib with slow ventricular response rate.  He had his metoprolol and amiodarone withheld and was placed on Zio patch.  Will have Zio rep do interrogation to see if there is need for permanent pacemaker implantation or if there is any ventricular arrhythmias.  Patient's chest pain is atypical and had multiple workups in the past with the last catheterization done in December 2023 which showed no significant obstructive disease.  He is being treated medically  and will continue that since troponin is borderline.  Will continue to follow the patient with you.  Thank you very much for referral.  Adrian Blackwater

## 2023-08-07 NOTE — TOC Progression Note (Signed)
Transition of Care [facility]) - Progression Note    Patient Details  Name: Lawrence [name] MRN: [id number] Date of Birth: [birth year]  Transition of [facility]) CM/SW Contact  Bing Quarry, RN Phone Number: 08/07/2023, 2:46 PM  Clinical Narrative:  08/07/23: Presented to [facility] ED on 9/21 and 1243 triage notes indicated complaints of reproducible chest pain associated with SOB while watching TV. Zio cardiac monitor in place on arrival.   Patient discharged to home on 08/05/23 from [facility] after being admitted for . He was also seen in ED 9/16, both times for complaints of central chest pain with noted radiation to LEFT arm.   Baseline of htx of COPD, chronic oxygen at 2L/[place], was wearing a heart monitor on arrival he had been wearing a week per triage notes. Placed by [facility] cardiology. Provider today indicated it was a ZIO cardiac Holter style monitor. Provider had requested if possible to access ZIO monitoring now, but this device is sent back to company before interpretation so is not in real time per data on device.   Consult note also indicates a Retail banker was placed at [facility] for severe bradycardia.   Htx of two falls in last two days d/t lightheadedness/blurry vision.  Reported in consult note patient reported passing out last week and prior to admission.  On Eliquis for chronic A-fib.  Noted non-compliance with CPAP for OSA. Chronic CHF.  PCP: [name] Cardiologist: Recently switched to [facility] cardiology.   Per recent TOC assessment on 9/19: Pt. recently active with [facility]  SNF: [facility] 8/2-8/12/24.  DME: has rollator, wheelchair, and 3-in-1 at home.  DME oxygen via [employer].   Gabriel Cirri MSN RN CM  Transitions of Care Department [facility] [phone] Weekends Only       Expected Discharge Plan and Services                                               Social Determinants of Health (SDOH) Interventions SDOH Screenings   Food Insecurity: No Food  Insecurity (08/06/2023)  Housing: Low Risk  (08/06/2023)  Transportation Needs: No Transportation Needs (08/06/2023)  Utilities: Not At Risk (08/06/2023)  Alcohol Screen: Low Risk  (03/10/2023)  Depression (PHQ2-9): Low Risk  (08/06/2023)  Recent Concern: Depression (PHQ2-9) - Medium Risk (06/02/2023)  Financial Resource Strain: Low Risk  (03/10/2023)  Physical Activity: Inactive (03/10/2023)  Social Connections: Moderately Isolated (08/06/2023)  Stress: No Stress Concern Present (03/10/2023)  Tobacco Use: Medium Risk (08/07/2023)  Health Literacy: Adequate Health Literacy (08/06/2023)    Readmission Risk Interventions    08/05/2023   10:11 AM 07/13/2023   10:52 AM 05/18/2023   10:56 AM  Readmission Risk Prevention Plan  Transportation Screening Complete Complete Complete  Medication Review Oceanographer) Complete Complete Complete  PCP or Specialist appointment within 3-5 days of discharge Complete Complete Complete  HRI or Home Care Consult   Complete  SW Recovery Care/Counseling Consult Complete Complete Complete  Palliative Care Screening Not Applicable Not Applicable Not Applicable  Skilled Nursing Facility Not Applicable Not Applicable Not Applicable

## 2023-08-07 NOTE — ED Notes (Signed)
Pt states nitroglycerin has not relieved his CP, asking if he can get a dose of Vicodin. Hospitalist Mansy messaged and made aware pt requesting pain med.

## 2023-08-07 NOTE — ED Provider Notes (Signed)
[facility] Provider Note    Event Date/Time   First MD Initiated Contact with Patient 08/07/23 [phone]     (approximate)   History   Chest Pain   HPI  Lawrence [name] is a 67 y.o. male brought to the ED via EMS from home with a chief complaint of chest pain.  Patient is well-known to the emergency department with a history of CAD with frequent visits for chest pain.  He was just hospitalized and discharged recently for same.  Reports reproducible central chest pain which began tonight around 8:30 PM associated with mild shortness of breath.  History of COPD, wears 2 L nasal cannula oxygen at baseline.  EMS administered 324 mg baby aspirin.  Heart monitor in place which was placed last week.  Denies fever/chills, cough, abdominal pain, nausea, vomiting or dizziness.     Past Medical History   Past Medical History:  Diagnosis Date   A-fib (HCC)    Anemia    Anginal pain (HCC)    Anxiety    Arthritis    Asthma    CAD (coronary artery disease)    a. 2002 CABGx2 (LIMA->LAD, VG->VG->OM1);  b. 09/2012 DES->OM;  c. 03/2015 PTCA of LAD [facility]) in setting of atretic LIMA; d. 05/2015 Cath [facility]): nonobs dzs; e. 06/2015 Cath ([facility]): LM nl, LAD 45p/d ISR, 50d, D1/2 small, LCX 50p/d ISR, OM1 70ost, 30 ISR, VG->OM1 50ost, 38m, LIMA->LAD 99p/d - atretic, RCA dom, nl; f.cath 10/16: 40-50%(FFR 0.90) pLAD, 75% (FFR 0.77) mLAD s/p PCI/DES, oRCA 40% (FFR0.95)   Cancer (HCC)    SKIN CANCER ON BACK   Celiac disease    Chronic diastolic CHF (congestive heart failure) (HCC)    a. 06/2009 Echo: EF 60-65%, Gr 1 DD, triv AI, mildly dil LA, nl RV.   COPD (chronic obstructive pulmonary disease) (HCC)    a. Chronic bronchitis and emphysema.   DDD (degenerative disc disease), lumbar    Diverticulosis    Dysrhythmia    Essential hypertension    GERD (gastroesophageal reflux disease)    History of hiatal hernia    History of kidney stones    H/O   History of tobacco abuse    a. Quit  2014.   Myocardial infarction [facility]) 2002   4 STENTS   Pancreatitis    PSVT (paroxysmal supraventricular tachycardia)    a. 10/2012 Noted on Zio Patch.   Sleep apnea    LOST CORD TO CPAP -ONLY 02 @ BEDTIME   Tubular adenoma of colon    Type II diabetes mellitus [facility])      Active Problem List   Patient Active Problem List   Diagnosis Date Noted   Chest pain 08/05/2023   NSTEMI (non-ST elevated myocardial infarction) (HCC) 08/04/2023   Diabetes mellitus without complication (HCC) 08/04/2023   HTN (hypertension) 08/04/2023   CAD (coronary artery disease) 08/04/2023   FTT (failure to thrive) in adult 07/21/2023   Hospital discharge follow-up 07/21/2023   Acute bronchitis with COPD (HCC) 07/21/2023   Diabetes mellitus (HCC) 07/21/2023   AKI (acute kidney injury) (HCC) 07/15/2023   PAD (peripheral artery disease) (HCC) 07/11/2023   Chronic diastolic CHF (congestive heart failure) (HCC)    Hypertensive urgency 04/27/2021   Atherosclerosis of aorta (HCC) 12/16/2020   Polycythemia 10/05/2020   Acute on chronic congestive heart failure (HCC) 04/18/2018   Paroxysmal atrial fibrillation (HCC) 12/23/2015   OSA (obstructive sleep apnea) 12/10/2015   Depression with anxiety 11/07/2015   BPH (  benign prostatic hyperplasia) 08/01/2015   Achalasia 07/24/2014   HLD (hyperlipidemia) 04/09/2013     Past Surgical History   Past Surgical History:  Procedure Laterality Date   BYPASS GRAFT     CARDIAC CATHETERIZATION N/A 07/12/2015   rocedure: Left Heart Cath and Cors/Grafts Angiography;  Surgeon: Lyn Records, MD;  Location: [facility] INVASIVE CV LAB;  Service: Cardiovascular;  Laterality: N/A;   CARDIAC CATHETERIZATION Right 10/07/2015   Procedure: Left Heart Cath and Cors/Grafts Angiography;  Surgeon: Laurier Nancy, MD;  Location: [facility] INVASIVE CV LAB;  Service: Cardiovascular;  Laterality: Right;   CARDIAC CATHETERIZATION N/A 04/06/2016   Procedure: Left Heart Cath and Coronary Angiography;   Surgeon: Alwyn Pea, MD;  Location: [facility] INVASIVE CV LAB;  Service: Cardiovascular;  Laterality: N/A;   CARDIAC CATHETERIZATION  04/06/2016   Procedure: Bypass Graft Angiography;  Surgeon: Alwyn Pea, MD;  Location: [facility] INVASIVE CV LAB;  Service: Cardiovascular;;   CARDIAC CATHETERIZATION N/A 11/02/2016   Procedure: Left Heart Cath and Cors/Grafts Angiography and possible PCI;  Surgeon: Alwyn Pea, MD;  Location: [facility] INVASIVE CV LAB;  Service: Cardiovascular;  Laterality: N/A;   CARDIAC CATHETERIZATION N/A 11/02/2016   Procedure: Coronary Stent Intervention;  Surgeon: Alwyn Pea, MD;  Location: [facility] INVASIVE CV LAB;  Service: Cardiovascular;  Laterality: N/A;   CARDIOVERSION N/A 06/29/2022   Procedure: CARDIOVERSION;  Surgeon: Lamar Blinks, MD;  Location: [facility] ORS;  Service: Cardiovascular;  Laterality: N/A;   CHOLECYSTECTOMY     CIRCUMCISION N/A 06/09/2019   Procedure: CIRCUMCISION ADULT;  Surgeon: Sondra Come, MD;  Location: [facility] ORS;  Service: Urology;  Laterality: N/A;   COLONOSCOPY WITH PROPOFOL N/A 04/01/2018   Procedure: COLONOSCOPY WITH PROPOFOL;  Surgeon: Scot Jun, MD;  Location: [facility] ENDOSCOPY;  Service: Endoscopy;  Laterality: N/A;   COLONOSCOPY WITH PROPOFOL N/A 05/01/2023   Procedure: COLONOSCOPY WITH PROPOFOL;  Surgeon: Toney Reil, MD;  Location: [facility] ENDOSCOPY;  Service: Gastroenterology;  Laterality: N/A;   ESOPHAGEAL DILATION     ESOPHAGOGASTRODUODENOSCOPY (EGD) WITH PROPOFOL N/A 04/01/2018   Procedure: ESOPHAGOGASTRODUODENOSCOPY (EGD) WITH PROPOFOL;  Surgeon: Scot Jun, MD;  Location: [facility] ENDOSCOPY;  Service: Endoscopy;  Laterality: N/A;   ESOPHAGOGASTRODUODENOSCOPY (EGD) WITH PROPOFOL N/A 05/01/2023   Procedure: ESOPHAGOGASTRODUODENOSCOPY (EGD) WITH PROPOFOL;  Surgeon: Toney Reil, MD;  Location: [facility] ENDOSCOPY;  Service: Gastroenterology;  Laterality: N/A;   GIVENS CAPSULE STUDY  05/01/2023   Procedure:  GIVENS CAPSULE STUDY;  Surgeon: Toney Reil, MD;  Location: [facility] ENDOSCOPY;  Service: Gastroenterology;;   LEFT HEART CATH AND CORS/GRAFTS ANGIOGRAPHY N/A 06/12/2019   Procedure: LEFT HEART CATH AND CORS/GRAFTS ANGIOGRAPHY;  Surgeon: Dalia Heading, MD;  Location: [facility] INVASIVE CV LAB;  Service: Cardiovascular;  Laterality: N/A;   LEFT HEART CATH AND CORS/GRAFTS ANGIOGRAPHY N/A 03/11/2020   Procedure: LEFT HEART CATH AND CORS/GRAFTS ANGIOGRAPHY;  Surgeon: Marcina Millard, MD;  Location: [facility] INVASIVE CV LAB;  Service: Cardiovascular;  Laterality: N/A;   LEFT HEART CATH AND CORS/GRAFTS ANGIOGRAPHY N/A 05/01/2021   Procedure: LEFT HEART CATH AND CORS/GRAFTS ANGIOGRAPHY;  Surgeon: Lamar Blinks, MD;  Location: [facility] INVASIVE CV LAB;  Service: Cardiovascular;  Laterality: N/A;   LEFT HEART CATH AND CORS/GRAFTS ANGIOGRAPHY N/A 11/02/2022   Procedure: LEFT HEART CATH AND CORS/GRAFTS ANGIOGRAPHY;  Surgeon: Alwyn Pea, MD;  Location: [facility] INVASIVE CV LAB;  Service: Cardiovascular;  Laterality: N/A;   TONSILLECTOMY     VASCULAR SURGERY       Home  Medications   Prior to Admission medications   Medication Sig Start Date End Date Taking? Authorizing Provider  allopurinol (ZYLOPRIM) 300 MG tablet TAKE 1 TABLET BY MOUTH TWICE A DAY 03/13/23  Yes Malva Limes, MD  ALPRAZolam Prudy Feeler) 1 MG tablet Take 1 tablet (1 mg total) by mouth 3 (three) times daily as needed. Patient taking differently: Take 1 mg by mouth 3 (three) times daily. 07/15/23  Yes Malva Limes, MD  amLODipine (NORVASC) 5 MG tablet Take 1 tablet by mouth daily. 07/29/23 08/28/23 Yes [provider]  apixaban (ELIQUIS) 5 MG TABS tablet Take 1 tablet (5 mg total) by mouth 2 (two) times daily. 07/21/23  Yes Merita Norton T, FNP  atorvastatin (LIPITOR) 80 MG tablet TAKE 1 TABLET BY MOUTH AT BEDTIME 07/15/23  Yes Malva Limes, MD  Budeson-Glycopyrrol-Formoterol (BREZTRI AEROSPHERE) 160-9-4.8 MCG/ACT AERO Inhale 2  puffs into the lungs 2 (two) times daily. 06/02/23  Yes Malva Limes, MD  cetirizine (ZYRTEC) 10 MG tablet TAKE 1 TABLET BY MOUTH AT BEDTIME 03/12/23  Yes Malva Limes, MD  clonazePAM (KLONOPIN) 0.5 MG tablet Take 0.5 mg by mouth 2 (two) times daily.   Yes [provider]  clopidogrel (PLAVIX) 75 MG tablet Take 1 tablet (75 mg total) by mouth daily. 07/21/23  Yes Jacky Kindle, FNP  FARXIGA 10 MG TABS tablet Take 10 mg by mouth daily. 05/25/23  Yes [provider]  iron polysaccharides (NIFEREX) 150 MG capsule Take 1 capsule (150 mg total) by mouth daily. 05/04/23  Yes Enedina Finner, MD  isosorbide mononitrate (IMDUR) 120 MG 24 hr tablet Take 1 tablet (120 mg total) by mouth daily. 07/21/23  Yes Merita Norton T, FNP  lisinopril (ZESTRIL) 20 MG tablet Take 20 mg by mouth daily. 03/17/23  Yes [provider]  metFORMIN (GLUCOPHAGE-XR) 500 MG 24 hr tablet Take 1 tablet (500 mg total) by mouth 2 (two) times daily. Patient taking differently: Take 500 mg by mouth daily. 07/21/23  Yes Merita Norton T, FNP  omega-3 acid ethyl esters (LOVAZA) 1 g capsule TAKE 4 CAPSULES (4 GRAMS TOTAL) BY MOUTHDAILY 07/09/23  Yes Malva Limes, MD  pantoprazole (PROTONIX) 40 MG tablet Take 1 tablet (40 mg total) by mouth 2 (two) times daily for 14 days. 06/08/23 08/07/23 Yes Celso Amy, PA-C  ranolazine (RANEXA) 1000 MG SR tablet Take 1 tablet (1,000 mg total) by mouth 2 (two) times daily. 08/05/23  Yes Marcelino Duster, MD  traZODone (DESYREL) 150 MG tablet TAKE 1 TABLET BY MOUTH AT BEDTIME Patient taking differently: Take 100 mg by mouth at bedtime. 11/24/22  Yes Malva Limes, MD  Accu-Chek Softclix Lancets lancets Use as instructed to check sugar daily for type 2 diabetes. 03/03/21   Malva Limes, MD  albuterol (VENTOLIN HFA) 108 (90 Base) MCG/ACT inhaler INHALE 2 PUFFS BY MOUTH EVERY 6 HOURS AS NEEDED FOR SHORTNESS OF BREATH Patient not taking: Reported on 08/05/2023 12/02/21   Malva Limes, MD  Blood Glucose Calibration (ACCU-CHEK GUIDE CONTROL) LIQD Use with blood glucose monitor as directed 02/20/21   Malva Limes, MD  Blood Glucose Monitoring Suppl (ACCU-CHEK GUIDE) w/Device KIT Use to check blood sugars as directed 02/20/21   Malva Limes, MD  dicyclomine (BENTYL) 10 MG capsule Take 1 capsule (10 mg total) by mouth 3 (three) times daily as needed (abdominal pain). 05/14/23   Phineas Semen, MD  glipiZIDE (GLUCOTROL) 5 MG tablet Take 1 tablet (5 mg total)  by mouth daily as needed (sugar over 300). 03/16/23   Malva Limes, MD  glucose blood (ACCU-CHEK GUIDE) test strip Use as instructed to check sugar daily for type 2 diabetes. 03/03/21   Malva Limes, MD  methocarbamol (ROBAXIN) 500 MG tablet TAKE 1 TABLET BY MOUTH EVERY 8 HOURS AS NEEDED FOR MUSCLE SPASMS 07/15/23   Malva Limes, MD  oxyCODONE-acetaminophen (PERCOCET) 10-325 MG tablet TAKE 1 TABLET BY MOUTH EVERY 4 HOURS AS NEEDED FOR PAIN 07/15/23   Malva Limes, MD  venlafaxine XR (EFFEXOR-XR) 75 MG 24 hr capsule TAKE 1 CAPSULE BY MOUTH DAILY WITH BREAKFAST Patient not taking: Reported on 08/05/2023 02/23/23   Malva Limes, MD     Allergies  Prednisone, Demerol  [meperidine hcl], Demerol [meperidine], Jardiance [empagliflozin], Sulfa antibiotics, Albuterol sulfate [albuterol], and Morphine sulfate   Family History   Family History  Problem Relation Age of Onset   Heart attack Mother    Depression Mother    Heart disease Mother    COPD Mother    Hypertension Mother    Heart attack Father    Diabetes Father    Depression Father    Heart disease Father    Cirrhosis Father    Parkinson's disease Brother      Physical Exam  Triage Vital Signs: ED Triage Vitals  Encounter Vitals Group     BP      Systolic BP Percentile      Diastolic BP Percentile      Pulse      Resp      Temp      Temp src      SpO2      Weight      Height      Head Circumference      Peak Flow      Pain  Score      Pain Loc      Pain Education      Exclude from Growth Chart     Updated Vital Signs: BP (!) 128/94   Pulse (!) 58   Temp (!) 97.5 F (36.4 C) (Oral)   Resp 16   Ht 5\' 7"  (1.702 m)   Wt 86.2 kg   SpO2 98%   BMI 29.76 kg/m    General: Awake, no distress.  CV:  RRR.  Good peripheral perfusion.  Resp:  Normal effort.  CTAB.  Reproducible central chest tenderness. Abd:  Nontender.  No distention.  Other:  Bilateral calves are supple and nontender.   ED Results / Procedures / Treatments  Labs (all labs ordered are listed, but only abnormal results are displayed) Labs Reviewed  COMPREHENSIVE METABOLIC PANEL - Abnormal; Notable for the following components:      Result Value   Glucose, Bld 251 (*)    Total Protein 6.3 (*)    Albumin 3.4 (*)    All other components within normal limits  CBC WITH DIFFERENTIAL/PLATELET - Abnormal; Notable for the following components:   RDW 15.7 (*)    Neutro Abs 8.4 (*)    All other components within normal limits  BRAIN NATRIURETIC PEPTIDE - Abnormal; Notable for the following components:   B Natriuretic Peptide 398.3 (*)    All other components within normal limits  TROPONIN I (HIGH SENSITIVITY) - Abnormal; Notable for the following components:   Troponin I (High Sensitivity) 84 (*)    All other components within normal limits  TROPONIN I (HIGH SENSITIVITY) - Abnormal; Notable  for the following components:   Troponin I (High Sensitivity) 98 (*)    All other components within normal limits  MAGNESIUM  CBC  CREATININE, SERUM  TROPONIN I (HIGH SENSITIVITY)     EKG  ED ECG REPORT I, Sed[place] Kitner J, the attending physician, personally viewed and interpreted this ECG.   Date: 08/07/2023  EKG Time: 0045  Rate: 79  Rhythm: atrial fibrillation, rate 79  Axis: Normal  Intervals:none  ST&T Change: Nonspecific    RADIOLOGY I have independently visualized and interpreted patient's imaging study as well as noted the radiology  interpretation:  Chest x-ray: Mild left basilar atelectasis  Official radiology report(s): DG Chest 1 View  Result Date: 08/07/2023 CLINICAL DATA:  Chest pain. EXAM: CHEST  1 VIEW COMPARISON:  August 04, 2023 FINDINGS: Multiple sternal wires and vascular clips are seen. The heart size and mediastinal contours are within normal limits. Mild atelectasis is seen within the left lung base. No pleural effusion or pneumothorax is identified. Multilevel degenerative changes seen throughout the thoracic spine. IMPRESSION: 1. Evidence of prior median sternotomy/CABG. 2. Mild left basilar atelectasis. Electronically Signed   By: Aram Candela M.D.   On: 08/07/2023 01:30     PROCEDURES:  Critical Care performed: No  .1-3 Lead EKG Interpretation  Performed by: Irean Hong, MD Authorized by: Irean Hong, MD     Interpretation: abnormal     ECG rate:  80   ECG rate assessment: normal     Rhythm: atrial fibrillation     Ectopy: none     Conduction: normal   Comments:     Patient placed on cardiac monitor to evaluate for arrhythmias    MEDICATIONS ORDERED IN ED: Medications  lidocaine (LIDODERM) 5 % 1 patch (1 patch Transdermal Patch Applied 08/07/23 0058)  acetaminophen (TYLENOL) tablet 650 mg (has no administration in time range)  ondansetron (ZOFRAN) injection 4 mg (has no administration in time range)  enoxaparin (LOVENOX) injection 40 mg (has no administration in time range)  0.9 %  sodium chloride infusion (has no administration in time range)  aspirin EC tablet 81 mg (has no administration in time range)  ALPRAZolam (XANAX) tablet 0.25 mg (has no administration in time range)  traZODone (DESYREL) tablet 25 mg (has no administration in time range)  magnesium hydroxide (MILK OF MAGNESIA) suspension 30 mL (has no administration in time range)  acetaminophen (TYLENOL) tablet 1,000 mg (1,000 mg Oral Given 08/07/23 0059)  oxyCODONE (Oxy IR/ROXICODONE) immediate release tablet 5 mg (5  mg Oral Given 08/07/23 0059)  morphine (PF) 4 MG/ML injection 4 mg (4 mg Intravenous Given 08/07/23 0108)  nitroGLYCERIN (NITROGLYN) 2 % ointment 1 inch (1 inch Topical Given 08/07/23 0357)     IMPRESSION / MDM / ASSESSMENT AND PLAN / ED COURSE  I reviewed the triage vital signs and the nursing notes.                             67 year old male presenting with reproducible chest pain. Differential diagnosis includes, but is not limited to, ACS, aortic dissection, pulmonary embolism, cardiac tamponade, pneumothorax, pneumonia, pericarditis, myocarditis, GI-related causes including esophagitis/gastritis, and musculoskeletal chest wall pain.   I personally reviewed patient's records and note his recent hospitalization 08/04/2023.  Patient's presentation is most consistent with acute presentation with potential threat to life or bodily function.  The patient is on the cardiac monitor to evaluate for evidence of arrhythmia and/or significant heart  rate changes.  Obtain cardiac panel, continuous monitoring, administer analgesia and reassess.  Clinical Course as of 08/07/23 0541  Sat Aug 07, 2023  5732 Initial troponin 84 which is decreased from prior.  Awaiting repeat troponin.  Patient resting comfortably in no acute distress. [JS]  0349 Uptrending troponin.  Patient still complains of chest pain.  Will apply nitroglycerin paste and consult hospital services for evaluation and admission. [JS]    Clinical Course User Index [JS] Irean Hong, MD     FINAL CLINICAL IMPRESSION(S) / ED DIAGNOSES   Final diagnoses:  Nonspecific chest pain     Rx / DC Orders   ED Discharge Orders     None        Note:  This document was prepared using Dragon voice recognition software and may include unintentional dictation errors.   Irean Hong, MD 08/07/23 [phone]

## 2023-08-07 NOTE — Plan of Care (Signed)
  Problem: Education: Goal: Understanding of cardiac disease, CV risk reduction, and recovery process will improve Outcome: Progressing Goal: Individualized Educational Video(s) Outcome: Progressing   Problem: Activity: Goal: Ability to tolerate increased activity will improve Outcome: Progressing   Problem: Cardiac: Goal: Ability to achieve and maintain adequate cardiovascular perfusion will improve Outcome: Progressing   Problem: Health Behavior/Discharge Planning: Goal: Ability to safely manage health-related needs after discharge will improve Outcome: Progressing   Problem: Education: Goal: Understanding of CV disease, CV risk reduction, and recovery process will improve Outcome: Progressing Goal: Individualized Educational Video(s) Outcome: Progressing   Problem: Activity: Goal: Ability to return to baseline activity level will improve Outcome: Progressing   Problem: Cardiovascular: Goal: Ability to achieve and maintain adequate cardiovascular perfusion will improve Outcome: Progressing Goal: Vascular access site(s) Level 0-1 will be maintained Outcome: Progressing   Problem: Health Behavior/Discharge Planning: Goal: Ability to safely manage health-related needs after discharge will improve Outcome: Progressing   Problem: Education: Goal: Knowledge of condition and prescribed therapy will improve Outcome: Progressing   Problem: Cardiac: Goal: Will achieve and/or maintain adequate cardiac output Outcome: Progressing   Problem: Physical Regulation: Goal: Complications related to the disease process, condition or treatment will be avoided or minimized Outcome: Progressing   Problem: Education: Goal: Knowledge of General Education information will improve Description: Including pain rating scale, medication(s)/side effects and non-pharmacologic comfort measures Outcome: Progressing   Problem: Health Behavior/Discharge Planning: Goal: Ability to manage  health-related needs will improve Outcome: Progressing   Problem: Clinical Measurements: Goal: Ability to maintain clinical measurements within normal limits will improve Outcome: Progressing Goal: Will remain free from infection Outcome: Progressing Goal: Diagnostic test results will improve Outcome: Progressing Goal: Respiratory complications will improve Outcome: Progressing Goal: Cardiovascular complication will be avoided Outcome: Progressing   Problem: Activity: Goal: Risk for activity intolerance will decrease Outcome: Progressing   Problem: Nutrition: Goal: Adequate nutrition will be maintained Outcome: Progressing   Problem: Coping: Goal: Level of anxiety will decrease Outcome: Progressing   Problem: Elimination: Goal: Will not experience complications related to bowel motility Outcome: Progressing Goal: Will not experience complications related to urinary retention Outcome: Progressing   Problem: Pain Managment: Goal: General experience of comfort will improve Outcome: Progressing   Problem: Safety: Goal: Ability to remain free from injury will improve Outcome: Progressing   Problem: Skin Integrity: Goal: Risk for impaired skin integrity will decrease Outcome: Progressing   Problem: Education: Goal: Ability to describe self-care measures that may prevent or decrease complications (Diabetes Survival Skills Education) will improve Outcome: Progressing Goal: Individualized Educational Video(s) Outcome: Progressing   Problem: Coping: Goal: Ability to adjust to condition or change in health will improve Outcome: Progressing   Problem: Fluid Volume: Goal: Ability to maintain a balanced intake and output will improve Outcome: Progressing   Problem: Health Behavior/Discharge Planning: Goal: Ability to identify and utilize available resources and services will improve Outcome: Progressing Goal: Ability to manage health-related needs will  improve Outcome: Progressing   Problem: Metabolic: Goal: Ability to maintain appropriate glucose levels will improve Outcome: Progressing   Problem: Nutritional: Goal: Maintenance of adequate nutrition will improve Outcome: Progressing Goal: Progress toward achieving an optimal weight will improve Outcome: Progressing   Problem: Skin Integrity: Goal: Risk for impaired skin integrity will decrease Outcome: Progressing   Problem: Tissue Perfusion: Goal: Adequacy of tissue perfusion will improve Outcome: Progressing

## 2023-08-07 NOTE — ED Triage Notes (Signed)
PER EMS: pt is from home with reproducible central chest pain that started tonight at 2030 when he was resting and watching television; associated with shortness of breath. Hx of COPD, wears 2L [place] at baseline. He received 324 aspirin. He arrives wearing a heart monitor that he has been wearing a week.  BP- 165/102, HR-78-107 afib, 97% 2L [place], RR-16, CBG-236

## 2023-08-07 NOTE — H&P (Signed)
History and Physical    Lawrence [name] ZOX:[id number] DOB: 05-15-1956 DOA: 08/07/2023  PCP: Malva Limes, MD (Confirm with patient/family/NH records and if not entered, this has to be entered at [facility] point of entry) Patient coming from: Home  I have personally briefly reviewed patient's old medical records in [facility]  Chief Complaint: Chest pain, light headed and fall at home  HPI: Lawrence [name] is a 67 y.o. male with medical history significant of CAD status post stenting x 4 CABG in 2016, chronic A-fib on Eliquis, PSVT, HTN, HLD, IIDM, COPD on 2 L oxygen continuously, OSA noncompliant with CPAP, chronic HFpEF, anxiety/depression, celiac disease, chronic pancreatitis, presented with multiple complaints including recurrent chest pains, lightheadedness and frequent falls at home.  Patient has been recently in the hospital for similar recurrent chest pains, he sustained every day with same features centrally located pressure-like radiated to left arm.  In addition, patient also reported that last 2 days he sustained 2 falls and along with prodromes of lightheadedness and blurry vision.  Denied any LOC, no head or neck injuries denies any arm or leg pain.  Recently patient switch his cardiology from [facility] to [facility] cardiology.  So far he visited [facility] cardiology once 2 weeks ago, " they worked me up for slow heart rate" and patient was put on 8 Zio patch for the last 2 weeks.  Patient has history of insulin-dependent diabetes but denies any history of hypoglycemia.  ED Course: Blood pressure elevated nonhypoxic down bradycardia.  Troponin slightly elevated 94> 86, EKG showed no significant ST changes.  Was given multiple rounds of p.o. medications including oxycodone, and IV morphine x 1 in the ED  Review of Systems: As per HPI otherwise 14 point review of systems negative.    Past Medical History:  Diagnosis Date   A-fib (HCC)    Anemia    Anginal pain (HCC)    Anxiety     Arthritis    Asthma    CAD (coronary artery disease)    a. 2002 CABGx2 (LIMA->LAD, VG->VG->OM1);  b. 09/2012 DES->OM;  c. 03/2015 PTCA of LAD [facility]) in setting of atretic LIMA; d. 05/2015 Cath [facility]): nonobs dzs; e. 06/2015 Cath ([facility]): LM nl, LAD 45p/d ISR, 50d, D1/2 small, LCX 50p/d ISR, OM1 70ost, 30 ISR, VG->OM1 50ost, 36m, LIMA->LAD 99p/d - atretic, RCA dom, nl; f.cath 10/16: 40-50%(FFR 0.90) pLAD, 75% (FFR 0.77) mLAD s/p PCI/DES, oRCA 40% (FFR0.95)   Cancer (HCC)    SKIN CANCER ON BACK   Celiac disease    Chronic diastolic CHF (congestive heart failure) (HCC)    a. 06/2009 Echo: EF 60-65%, Gr 1 DD, triv AI, mildly dil LA, nl RV.   COPD (chronic obstructive pulmonary disease) (HCC)    a. Chronic bronchitis and emphysema.   DDD (degenerative disc disease), lumbar    Diverticulosis    Dysrhythmia    Essential hypertension    GERD (gastroesophageal reflux disease)    History of hiatal hernia    History of kidney stones    H/O   History of tobacco abuse    a. Quit 2014.   Myocardial infarction [facility]) 2002   4 STENTS   Pancreatitis    PSVT (paroxysmal supraventricular tachycardia)    a. 10/2012 Noted on Zio Patch.   Sleep apnea    LOST CORD TO CPAP -ONLY 02 @ BEDTIME   Tubular adenoma of colon    Type II diabetes mellitus (HCC)  Past Surgical History:  Procedure Laterality Date   BYPASS GRAFT     CARDIAC CATHETERIZATION N/A 07/12/2015   rocedure: Left Heart Cath and Cors/Grafts Angiography;  Surgeon: Lyn Records, MD;  Location: [facility] INVASIVE CV LAB;  Service: Cardiovascular;  Laterality: N/A;   CARDIAC CATHETERIZATION Right 10/07/2015   Procedure: Left Heart Cath and Cors/Grafts Angiography;  Surgeon: Laurier Nancy, MD;  Location: [facility] INVASIVE CV LAB;  Service: Cardiovascular;  Laterality: Right;   CARDIAC CATHETERIZATION N/A 04/06/2016   Procedure: Left Heart Cath and Coronary Angiography;  Surgeon: Alwyn Pea, MD;  Location: [facility] INVASIVE CV LAB;  Service:  Cardiovascular;  Laterality: N/A;   CARDIAC CATHETERIZATION  04/06/2016   Procedure: Bypass Graft Angiography;  Surgeon: Alwyn Pea, MD;  Location: [facility] INVASIVE CV LAB;  Service: Cardiovascular;;   CARDIAC CATHETERIZATION N/A 11/02/2016   Procedure: Left Heart Cath and Cors/Grafts Angiography and possible PCI;  Surgeon: Alwyn Pea, MD;  Location: [facility] INVASIVE CV LAB;  Service: Cardiovascular;  Laterality: N/A;   CARDIAC CATHETERIZATION N/A 11/02/2016   Procedure: Coronary Stent Intervention;  Surgeon: Alwyn Pea, MD;  Location: [facility] INVASIVE CV LAB;  Service: Cardiovascular;  Laterality: N/A;   CARDIOVERSION N/A 06/29/2022   Procedure: CARDIOVERSION;  Surgeon: Lamar Blinks, MD;  Location: [facility] ORS;  Service: Cardiovascular;  Laterality: N/A;   CHOLECYSTECTOMY     CIRCUMCISION N/A 06/09/2019   Procedure: CIRCUMCISION ADULT;  Surgeon: Sondra Come, MD;  Location: [facility] ORS;  Service: Urology;  Laterality: N/A;   COLONOSCOPY WITH PROPOFOL N/A 04/01/2018   Procedure: COLONOSCOPY WITH PROPOFOL;  Surgeon: Scot Jun, MD;  Location: [facility] ENDOSCOPY;  Service: Endoscopy;  Laterality: N/A;   COLONOSCOPY WITH PROPOFOL N/A 05/01/2023   Procedure: COLONOSCOPY WITH PROPOFOL;  Surgeon: Toney Reil, MD;  Location: [facility] ENDOSCOPY;  Service: Gastroenterology;  Laterality: N/A;   ESOPHAGEAL DILATION     ESOPHAGOGASTRODUODENOSCOPY (EGD) WITH PROPOFOL N/A 04/01/2018   Procedure: ESOPHAGOGASTRODUODENOSCOPY (EGD) WITH PROPOFOL;  Surgeon: Scot Jun, MD;  Location: [facility] ENDOSCOPY;  Service: Endoscopy;  Laterality: N/A;   ESOPHAGOGASTRODUODENOSCOPY (EGD) WITH PROPOFOL N/A 05/01/2023   Procedure: ESOPHAGOGASTRODUODENOSCOPY (EGD) WITH PROPOFOL;  Surgeon: Toney Reil, MD;  Location: [facility] ENDOSCOPY;  Service: Gastroenterology;  Laterality: N/A;   GIVENS CAPSULE STUDY  05/01/2023   Procedure: GIVENS CAPSULE STUDY;  Surgeon: Toney Reil, MD;  Location: [facility]  ENDOSCOPY;  Service: Gastroenterology;;   LEFT HEART CATH AND CORS/GRAFTS ANGIOGRAPHY N/A 06/12/2019   Procedure: LEFT HEART CATH AND CORS/GRAFTS ANGIOGRAPHY;  Surgeon: Dalia Heading, MD;  Location: [facility] INVASIVE CV LAB;  Service: Cardiovascular;  Laterality: N/A;   LEFT HEART CATH AND CORS/GRAFTS ANGIOGRAPHY N/A 03/11/2020   Procedure: LEFT HEART CATH AND CORS/GRAFTS ANGIOGRAPHY;  Surgeon: Marcina Millard, MD;  Location: [facility] INVASIVE CV LAB;  Service: Cardiovascular;  Laterality: N/A;   LEFT HEART CATH AND CORS/GRAFTS ANGIOGRAPHY N/A 05/01/2021   Procedure: LEFT HEART CATH AND CORS/GRAFTS ANGIOGRAPHY;  Surgeon: Lamar Blinks, MD;  Location: [facility] INVASIVE CV LAB;  Service: Cardiovascular;  Laterality: N/A;   LEFT HEART CATH AND CORS/GRAFTS ANGIOGRAPHY N/A 11/02/2022   Procedure: LEFT HEART CATH AND CORS/GRAFTS ANGIOGRAPHY;  Surgeon: Alwyn Pea, MD;  Location: [facility] INVASIVE CV LAB;  Service: Cardiovascular;  Laterality: N/A;   TONSILLECTOMY     VASCULAR SURGERY       reports that he quit smoking about 10 years ago. His smoking use included cigarettes. He started smoking about 60 years ago. He has  a 150 pack-year smoking history. He has never used smokeless tobacco. He reports that he does not currently use drugs after having used the following drugs: Marijuana. He reports that he does not drink alcohol.  Allergies  Allergen Reactions   Prednisone Other (See Comments) and Hypertension    Pt states that this medication puts him in A-fib    Demerol  [Meperidine Hcl]    Demerol [Meperidine] Hives   Jardiance [Empagliflozin] Other (See Comments)    Perineal pain   Sulfa Antibiotics Hives   Albuterol Sulfate [Albuterol] Palpitations and Other (See Comments)    Pt currently uses this medication.     Morphine Sulfate Nausea And Vomiting, Rash and Other (See Comments)    Pt states that he is only allergic to the tablet form of this medication.      Family History  Problem  Relation Age of Onset   Heart attack Mother    Depression Mother    Heart disease Mother    COPD Mother    Hypertension Mother    Heart attack Father    Diabetes Father    Depression Father    Heart disease Father    Cirrhosis Father    Parkinson's disease Brother     Prior to Admission medications   Medication Sig Start Date End Date Taking? Authorizing Provider  allopurinol (ZYLOPRIM) 300 MG tablet TAKE 1 TABLET BY MOUTH TWICE A DAY 03/13/23  Yes Malva Limes, MD  ALPRAZolam Prudy Feeler) 1 MG tablet Take 1 tablet (1 mg total) by mouth 3 (three) times daily as needed. Patient taking differently: Take 1 mg by mouth 3 (three) times daily. 07/15/23  Yes Malva Limes, MD  amLODipine (NORVASC) 5 MG tablet Take 1 tablet by mouth daily. 07/29/23 08/28/23 Yes [provider]  apixaban (ELIQUIS) 5 MG TABS tablet Take 1 tablet (5 mg total) by mouth 2 (two) times daily. 07/21/23  Yes Merita Norton T, FNP  atorvastatin (LIPITOR) 80 MG tablet TAKE 1 TABLET BY MOUTH AT BEDTIME 07/15/23  Yes Malva Limes, MD  Budeson-Glycopyrrol-Formoterol (BREZTRI AEROSPHERE) 160-9-4.8 MCG/ACT AERO Inhale 2 puffs into the lungs 2 (two) times daily. 06/02/23  Yes Malva Limes, MD  cetirizine (ZYRTEC) 10 MG tablet TAKE 1 TABLET BY MOUTH AT BEDTIME 03/12/23  Yes Malva Limes, MD  clonazePAM (KLONOPIN) 0.5 MG tablet Take 0.5 mg by mouth 2 (two) times daily.   Yes [provider]  clopidogrel (PLAVIX) 75 MG tablet Take 1 tablet (75 mg total) by mouth daily. 07/21/23  Yes Jacky Kindle, FNP  FARXIGA 10 MG TABS tablet Take 10 mg by mouth daily. 05/25/23  Yes [provider]  iron polysaccharides (NIFEREX) 150 MG capsule Take 1 capsule (150 mg total) by mouth daily. 05/04/23  Yes Enedina Finner, MD  isosorbide mononitrate (IMDUR) 120 MG 24 hr tablet Take 1 tablet (120 mg total) by mouth daily. 07/21/23  Yes Merita Norton T, FNP  lisinopril (ZESTRIL) 20 MG tablet Take 20 mg by mouth daily. 03/17/23  Yes  [provider]  metFORMIN (GLUCOPHAGE-XR) 500 MG 24 hr tablet Take 1 tablet (500 mg total) by mouth 2 (two) times daily. Patient taking differently: Take 500 mg by mouth daily. 07/21/23  Yes Merita Norton T, FNP  omega-3 acid ethyl esters (LOVAZA) 1 g capsule TAKE 4 CAPSULES (4 GRAMS TOTAL) BY MOUTHDAILY 07/09/23  Yes Malva Limes, MD  pantoprazole (PROTONIX) 40 MG tablet Take 1 tablet (40 mg total) by mouth  2 (two) times daily for 14 days. 06/08/23 08/07/23 Yes Celso Amy, PA-C  ranolazine (RANEXA) 1000 MG SR tablet Take 1 tablet (1,000 mg total) by mouth 2 (two) times daily. 08/05/23  Yes Marcelino Duster, MD  traZODone (DESYREL) 150 MG tablet TAKE 1 TABLET BY MOUTH AT BEDTIME Patient taking differently: Take 100 mg by mouth at bedtime. 11/24/22  Yes Malva Limes, MD  Accu-Chek Softclix Lancets lancets Use as instructed to check sugar daily for type 2 diabetes. 03/03/21   Malva Limes, MD  albuterol (VENTOLIN HFA) 108 (90 Base) MCG/ACT inhaler INHALE 2 PUFFS BY MOUTH EVERY 6 HOURS AS NEEDED FOR SHORTNESS OF BREATH Patient not taking: Reported on 08/05/2023 12/02/21   Malva Limes, MD  Blood Glucose Calibration (ACCU-CHEK GUIDE CONTROL) LIQD Use with blood glucose monitor as directed 02/20/21   Malva Limes, MD  Blood Glucose Monitoring Suppl (ACCU-CHEK GUIDE) w/Device KIT Use to check blood sugars as directed 02/20/21   Malva Limes, MD  dicyclomine (BENTYL) 10 MG capsule Take 1 capsule (10 mg total) by mouth 3 (three) times daily as needed (abdominal pain). 05/14/23   Phineas Semen, MD  glipiZIDE (GLUCOTROL) 5 MG tablet Take 1 tablet (5 mg total) by mouth daily as needed (sugar over 300). 03/16/23   Malva Limes, MD  glucose blood (ACCU-CHEK GUIDE) test strip Use as instructed to check sugar daily for type 2 diabetes. 03/03/21   Malva Limes, MD  methocarbamol (ROBAXIN) 500 MG tablet TAKE 1 TABLET BY MOUTH EVERY 8 HOURS AS NEEDED FOR MUSCLE SPASMS 07/15/23    Malva Limes, MD  oxyCODONE-acetaminophen (PERCOCET) 10-325 MG tablet TAKE 1 TABLET BY MOUTH EVERY 4 HOURS AS NEEDED FOR PAIN 07/15/23   Malva Limes, MD  venlafaxine XR (EFFEXOR-XR) 75 MG 24 hr capsule TAKE 1 CAPSULE BY MOUTH DAILY WITH BREAKFAST Patient not taking: Reported on 08/05/2023 02/23/23   Malva Limes, MD    Physical Exam: Vitals:   08/07/23 0530 08/07/23 0614 08/07/23 0812 08/07/23 0812  BP: (!) 146/92 139/89 138/89 138/89  Pulse: 75 67 67 67  Resp: 11 20 16 16   Temp:  97.6 F (36.4 C) 97.6 F (36.4 C) 97.6 F (36.4 C)  TempSrc:    Oral  SpO2: 98% 98% 97% 97%  Weight:      Height:        Constitutional: NAD, calm, comfortable Vitals:   08/07/23 0530 08/07/23 0614 08/07/23 0812 08/07/23 0812  BP: (!) 146/92 139/89 138/89 138/89  Pulse: 75 67 67 67  Resp: 11 20 16 16   Temp:  97.6 F (36.4 C) 97.6 F (36.4 C) 97.6 F (36.4 C)  TempSrc:    Oral  SpO2: 98% 98% 97% 97%  Weight:      Height:       Eyes: PERRL, lids and conjunctivae normal ENMT: Mucous membranes are moist. Posterior pharynx clear of any exudate or lesions.Normal dentition.  Neck: normal, supple, no masses, no thyromegaly Respiratory: clear to auscultation bilaterally, no wheezing, no crackles. Normal respiratory effort. No accessory muscle use.  Cardiovascular: Regular rate and rhythm, no murmurs / rubs / gallops. No extremity edema. 2+ pedal pulses. No carotid bruits.  Abdomen: no tenderness, no masses palpated. No hepatosplenomegaly. Bowel sounds positive.  Musculoskeletal: no clubbing / cyanosis. No joint deformity upper and lower extremities. Good ROM, no contractures. Normal muscle tone.  Skin: no rashes, lesions, ulcers. No induration Neurologic: CN 2-12 grossly intact. Sensation intact, DTR normal.  Strength 5/5 in all 4.  Psychiatric: Normal judgment and insight. Alert and oriented x 3. Normal mood.     Labs on Admission: I have personally reviewed following labs and imaging  studies  CBC: Recent Labs  Lab 08/02/23 2356 08/04/23 1944 08/05/23 0528 08/07/23 0052 08/07/23 0546  WBC 7.2 6.1 5.1 9.7 9.2  NEUTROABS 5.1  --   --  8.4*  --   HGB 14.4 13.6 13.0 13.0 12.7*  HCT 45.6 44.6 41.6 41.0 40.1  MCV 91.9 94.9 94.8 93.0 93.3  PLT 177 149* 155 164 158   Basic Metabolic Panel: Recent Labs  Lab 08/02/23 2356 08/04/23 1944 08/05/23 0940 08/07/23 0052 08/07/23 0546  NA 140 140 139 139  --   K 3.8 3.8 4.6 3.6  --   CL 100 108 107 103  --   CO2 28 25 26 24   --   GLUCOSE 100* 158* 102* 251*  --   BUN 22 12 10 17   --   CREATININE 1.10 0.77 0.75 0.84 0.72  CALCIUM 9.3 8.9 8.9 9.6  --   MG  --   --   --  1.9  --    GFR: Estimated Creatinine Clearance: 91.3 mL/min (by C-G formula based on SCr of 0.72 mg/dL). Liver Function Tests: Recent Labs  Lab 08/02/23 2356 08/04/23 1944 08/07/23 0052  AST 16 14* 16  ALT 15 13 15   ALKPHOS 64 57 58  BILITOT 0.8 0.7 0.6  PROT 6.7 6.3* 6.3*  ALBUMIN 3.8 3.5 3.4*   No results for input(s): "LIPASE", "AMYLASE" in the last 168 hours. No results for input(s): "AMMONIA" in the last 168 hours. Coagulation Profile: Recent Labs  Lab 08/04/23 1944  INR 1.2   Cardiac Enzymes: No results for input(s): "CKTOTAL", "CKMB", "CKMBINDEX", "TROPONINI" in the last 168 hours. BNP (last 3 results) No results for input(s): "PROBNP" in the last 8760 hours. HbA1C: Recent Labs    08/05/23 0525  HGBA1C 6.1*   CBG: Recent Labs  Lab 08/04/23 2119 08/05/23 0802  GLUCAP 120* 112*   Lipid Profile: Recent Labs    08/04/23 1944  CHOL 120  HDL 41  LDLCALC 60  TRIG 95  CHOLHDL 2.9   Thyroid Function Tests: No results for input(s): "TSH", "T4TOTAL", "FREET4", "T3FREE", "THYROIDAB" in the last 72 hours. Anemia Panel: No results for input(s): "VITAMINB12", "FOLATE", "FERRITIN", "TIBC", "IRON", "RETICCTPCT" in the last 72 hours. Urine analysis:    Component Value Date/Time   COLORURINE AMBER (A) 06/15/2023 2215    APPEARANCEUR HAZY (A) 06/15/2023 2215   APPEARANCEUR Clear 05/25/2019 1554   LABSPEC 1.030 06/15/2023 2215   LABSPEC 1.012 03/07/2015 2143   PHURINE 5.0 06/15/2023 2215   GLUCOSEU >=500 (A) 06/15/2023 2215   GLUCOSEU Negative 03/07/2015 2143   HGBUR NEGATIVE 06/15/2023 2215   BILIRUBINUR NEGATIVE 06/15/2023 2215   BILIRUBINUR Negative 05/25/2019 1554   BILIRUBINUR Negative 03/07/2015 2143   KETONESUR NEGATIVE 06/15/2023 2215   PROTEINUR 30 (A) 06/15/2023 2215   UROBILINOGEN 0.2 03/26/2017 0907   NITRITE NEGATIVE 06/15/2023 2215   LEUKOCYTESUR NEGATIVE 06/15/2023 2215   LEUKOCYTESUR Negative 03/07/2015 2143    Radiological Exams on Admission: DG Chest 1 View  Result Date: 08/07/2023 CLINICAL DATA:  Chest pain. EXAM: CHEST  1 VIEW COMPARISON:  August 04, 2023 FINDINGS: Multiple sternal wires and vascular clips are seen. The heart size and mediastinal contours are within normal limits. Mild atelectasis is seen within the left lung base. No pleural effusion or pneumothorax  is identified. Multilevel degenerative changes seen throughout the thoracic spine. IMPRESSION: 1. Evidence of prior median sternotomy/CABG. 2. Mild left basilar atelectasis. Electronically Signed   By: Aram Candela M.D.   On: 08/07/2023 01:30    EKG: Independently reviewed.  A-fib, no acute ST changes.  Assessment/Plan Principal Problem:   Syncope Active Problems:   Chest pain  (please populate well all problems here in Problem List. (For example, if patient is on BP meds at home and you resume or decide to hold them, it is a problem that needs to be her. Same for CAD, COPD, HLD and so on)  Near syncope's -Suspect symptomatic bradycardia, discussed the case with on-call cardiology Dr. Welton Flakes covering [place], who recommend we do Zio patch interrogation.  Communicated with nurse who is to contact Zio patch company for further instructions. -Other DDx, A1c came back 6.1, although patient denied any other  symptoms of hypoglycemia such as sweating nausea and palpitations, decided to hold off glimepiride and Farxiga, continue metformin -Check orthostatic vital signs  Chronic angina -Reviewed patient history showed most recent cath in November 2023 showed no significant critical stenosis coronary arteries, and patient already on nitro and ranolazine.  Patient is again reassured about the cardiac workup, patient however is not convinced and I further recommend patient continue to follow-up with [facility] cardiology for further workup. -No indication for escalate CAD management at this point, plan to continue nitrite and ranolazine  Chronic HFrEF and HFpEF -Euvolemic, continue p.o. Lasix  PAF -No acute concerns, continue Eliquis -Continue metoprolol and amiodarone -Reviewed patient history in Care Everywhere: patient had a recent admission at [facility] for nauseous vomiting earlier this month, then patient was found to be bradycardic and on discharge both amiodarone and metoprolol were discontinued.  Plan to continue to hold off metoprolol and amiodarone until patient seen by [facility] cardiology again.   IIDM -Hold off glimepiride and Farxiga -Continue metformin  DVT prophylaxis: Eliquis Code Status: DNR Family Communication: None at bedside Disposition Plan: Expect less than 2 midnight hospital stay Consults called: Curbside consult with Dr.Khan covering [facility] Admission status: Tele obs   Emeline General MD [facility] Pager [phone]  08/07/2023, 11:09 AM

## 2023-08-08 ENCOUNTER — Observation Stay
Admit: 2023-08-08 | Discharge: 2023-08-08 | Disposition: A | Discharge status: Home / Self Care | Payer: Medicare HMO | Attending: Cardiovascular Disease | Admitting: Cardiovascular Disease

## 2023-08-08 DIAGNOSIS — R55 Syncope and collapse: Secondary | ICD-10-CM | POA: Diagnosis not present

## 2023-08-08 DIAGNOSIS — R079 Chest pain, unspecified: Secondary | ICD-10-CM | POA: Diagnosis not present

## 2023-08-08 DIAGNOSIS — I5043 Acute on chronic combined systolic (congestive) and diastolic (congestive) heart failure: Secondary | ICD-10-CM | POA: Diagnosis not present

## 2023-08-08 DIAGNOSIS — I10 Essential (primary) hypertension: Secondary | ICD-10-CM

## 2023-08-08 DIAGNOSIS — E119 Type 2 diabetes mellitus without complications: Secondary | ICD-10-CM

## 2023-08-08 LAB — ECHOCARDIOGRAM COMPLETE
AR max vel: 2.66 cm2
AV Area VTI: 3.08 cm2
AV Area mean vel: 2.63 cm2
AV Mean grad: 2 mmHg
AV Peak grad: 4.2 mmHg
Ao pk vel: 1.02 m/s
Area-P 1/2: 3.68 cm2
Calc EF: 34.8 %
Height: 67 in
MV VTI: 1.47 cm2
P 1/2 time: 610 msec
S' Lateral: 4.4 cm
Single Plane A2C EF: 32.6 %
Single Plane A4C EF: 34.5 %
Weight: 3188.73 oz

## 2023-08-08 LAB — LIPID PANEL
Cholesterol: 105 mg/dL (ref 0–200)
HDL: 43 mg/dL (ref >40)
LDL Cholesterol: 31 mg/dL (ref 0–99)
Total CHOL/HDL Ratio: 2.4 RATIO
Triglycerides: 155 mg/dL — ABNORMAL HIGH (ref <150)
VLDL: 31 mg/dL (ref 0–40)

## 2023-08-08 LAB — GLUCOSE, CAPILLARY
Glucose-Capillary: 118 mg/dL — ABNORMAL HIGH (ref 70–99)
Glucose-Capillary: 124 mg/dL — ABNORMAL HIGH (ref 70–99)
Glucose-Capillary: 117 mg/dL — ABNORMAL HIGH (ref 70–99)

## 2023-08-08 MED ORDER — DAPAGLIFLOZIN PROPANEDIOL 10 MG PO TABS
10.0000 mg | ORAL_TABLET | Freq: Every day | ORAL | Status: DC
  Administered 2023-08-08: 10 mg via ORAL
  Filled 2023-08-08: qty 1

## 2023-08-08 MED ORDER — SPIRONOLACTONE 25 MG PO TABS: 12.5000 mg | ORAL_TABLET | Freq: Every day | ORAL | 1 refills | Status: DC

## 2023-08-08 MED ORDER — SPIRONOLACTONE 12.5 MG HALF TABLET
12.5000 mg | ORAL_TABLET | Freq: Every day | ORAL | Status: DC
  Administered 2023-08-08: 12.5 mg via ORAL
  Filled 2023-08-08: qty 1

## 2023-08-08 MED ORDER — LIDOCAINE 5 % EX PTCH: 1.0000 | MEDICATED_PATCH | CUTANEOUS | 0 refills | Status: DC

## 2023-08-08 MED ORDER — SACUBITRIL-VALSARTAN 24-26 MG PO TABS: 1.0000 | ORAL_TABLET | Freq: Two times a day (BID) | ORAL | 1 refills | Status: DC

## 2023-08-08 MED ORDER — SACUBITRIL-VALSARTAN 24-26 MG PO TABS: 1.0000 | ORAL_TABLET | Freq: Two times a day (BID) | ORAL | Status: DC

## 2023-08-08 NOTE — Assessment & Plan Note (Signed)
>>  ASSESSMENT AND PLAN FOR HTN (HYPERTENSION) WRITTEN ON 08/08/2023  1:29 PM BY AMIN, SUMAYYA, MD  Blood pressure within goal. -Home amlodipine  and lisinopril  has been switched with Entresto  and Aldactone  by cardiology.

## 2023-08-08 NOTE — Assessment & Plan Note (Signed)
Blood pressure within goal. -Home amlodipine and lisinopril has been switched with Entresto and Aldactone by cardiology.

## 2023-08-08 NOTE — Discharge Summary (Signed)
needed (abdominal pain).   Farxiga 10 MG Tabs tablet Generic drug: dapagliflozin propanediol Take 10 mg by mouth daily.   glipiZIDE 5 MG tablet Commonly known as: GLUCOTROL Take 1 tablet (5 mg total) by mouth daily as needed (sugar over 300).   iron polysaccharides 150 MG capsule Commonly known as: NIFEREX Take 1 capsule (150 mg total) by mouth daily.   isosorbide mononitrate 120 MG 24 hr tablet Commonly known as: IMDUR Take 1 tablet (120 mg total) by mouth daily.   lidocaine 5 % Commonly known as: LIDODERM Place 1 patch onto the skin daily. Remove & Discard patch within 12 hours or as directed by MD Start taking on: August 09, 2023   metFORMIN 500 MG 24 hr tablet Commonly known as: GLUCOPHAGE-XR Take 1 tablet (500 mg total) by mouth 2 (two) times daily. What changed: when to take this   methocarbamol 500 MG tablet Commonly known as: ROBAXIN TAKE 1 TABLET BY MOUTH EVERY 8 HOURS AS NEEDED FOR MUSCLE SPASMS   omega-3 acid ethyl esters 1 g capsule Commonly known as: LOVAZA TAKE 4 CAPSULES (4 GRAMS TOTAL) BY MOUTHDAILY   oxyCODONE-acetaminophen 10-325 MG tablet Commonly known as: PERCOCET TAKE 1 TABLET BY MOUTH EVERY 4 HOURS AS NEEDED FOR PAIN   pantoprazole 40 MG tablet Commonly known as: PROTONIX Take 1 tablet (40 mg total) by mouth 2 (two) times daily for 14 days.   ranolazine 1000 MG SR tablet Commonly known as: RANEXA Take 1 tablet (1,000 mg total) by mouth 2 (two) times daily.   sacubitril-valsartan 24-26 MG Commonly known as: ENTRESTO Take 1 tablet by mouth 2 (two) times daily. Start taking on: August 09, 2023   spironolactone 25 MG tablet Commonly known as: ALDACTONE Take 0.5 tablets (12.5 mg total) by mouth daily. Start taking on: August 09, 2023   traZODone 150 MG tablet Commonly known as: DESYREL TAKE 1 TABLET BY MOUTH AT BEDTIME What changed: how much to take        Discharge Exam: Filed Weights   08/07/23 0052 08/08/23 0500  Weight: 86.2 kg 90.4 kg   General.  Well-developed gentleman, in no acute distress. Pulmonary.  Lungs clear bilaterally, normal respiratory effort. CV.  Regular rate and rhythm, no JVD, rub or murmur. Abdomen.  Soft, nontender, nondistended, BS positive. CNS.  Alert and oriented .  No focal neurologic deficit. Extremities.  No edema, no cyanosis, pulses intact and symmetrical. Psychiatry.  Judgment and insight appears normal.   Condition at discharge: stable  The results of significant diagnostics from this hospitalization (including imaging, microbiology, ancillary and laboratory) are listed below for reference.   Imaging  Studies: ECHOCARDIOGRAM COMPLETE  Result Date: 08/08/2023    ECHOCARDIOGRAM REPORT   Patient Name:   Lawrence [name] [facility] Date of Exam: 08/08/2023 Medical Rec #:  [id number]       Height:       67.0 in Accession #:    [id number]      Weight:       199.3 lb Date of Birth:  September 23, 1956       BSA:          2.019 m Patient Age:    67 years        BP:           156/78 mmHg Patient Gender: M               HR:           53 bpm. Exam  needed (abdominal pain).   Farxiga 10 MG Tabs tablet Generic drug: dapagliflozin propanediol Take 10 mg by mouth daily.   glipiZIDE 5 MG tablet Commonly known as: GLUCOTROL Take 1 tablet (5 mg total) by mouth daily as needed (sugar over 300).   iron polysaccharides 150 MG capsule Commonly known as: NIFEREX Take 1 capsule (150 mg total) by mouth daily.   isosorbide mononitrate 120 MG 24 hr tablet Commonly known as: IMDUR Take 1 tablet (120 mg total) by mouth daily.   lidocaine 5 % Commonly known as: LIDODERM Place 1 patch onto the skin daily. Remove & Discard patch within 12 hours or as directed by MD Start taking on: August 09, 2023   metFORMIN 500 MG 24 hr tablet Commonly known as: GLUCOPHAGE-XR Take 1 tablet (500 mg total) by mouth 2 (two) times daily. What changed: when to take this   methocarbamol 500 MG tablet Commonly known as: ROBAXIN TAKE 1 TABLET BY MOUTH EVERY 8 HOURS AS NEEDED FOR MUSCLE SPASMS   omega-3 acid ethyl esters 1 g capsule Commonly known as: LOVAZA TAKE 4 CAPSULES (4 GRAMS TOTAL) BY MOUTHDAILY   oxyCODONE-acetaminophen 10-325 MG tablet Commonly known as: PERCOCET TAKE 1 TABLET BY MOUTH EVERY 4 HOURS AS NEEDED FOR PAIN   pantoprazole 40 MG tablet Commonly known as: PROTONIX Take 1 tablet (40 mg total) by mouth 2 (two) times daily for 14 days.   ranolazine 1000 MG SR tablet Commonly known as: RANEXA Take 1 tablet (1,000 mg total) by mouth 2 (two) times daily.   sacubitril-valsartan 24-26 MG Commonly known as: ENTRESTO Take 1 tablet by mouth 2 (two) times daily. Start taking on: August 09, 2023   spironolactone 25 MG tablet Commonly known as: ALDACTONE Take 0.5 tablets (12.5 mg total) by mouth daily. Start taking on: August 09, 2023   traZODone 150 MG tablet Commonly known as: DESYREL TAKE 1 TABLET BY MOUTH AT BEDTIME What changed: how much to take        Discharge Exam: Filed Weights   08/07/23 0052 08/08/23 0500  Weight: 86.2 kg 90.4 kg   General.  Well-developed gentleman, in no acute distress. Pulmonary.  Lungs clear bilaterally, normal respiratory effort. CV.  Regular rate and rhythm, no JVD, rub or murmur. Abdomen.  Soft, nontender, nondistended, BS positive. CNS.  Alert and oriented .  No focal neurologic deficit. Extremities.  No edema, no cyanosis, pulses intact and symmetrical. Psychiatry.  Judgment and insight appears normal.   Condition at discharge: stable  The results of significant diagnostics from this hospitalization (including imaging, microbiology, ancillary and laboratory) are listed below for reference.   Imaging  Studies: ECHOCARDIOGRAM COMPLETE  Result Date: 08/08/2023    ECHOCARDIOGRAM REPORT   Patient Name:   Lawrence [name] [facility] Date of Exam: 08/08/2023 Medical Rec #:  [id number]       Height:       67.0 in Accession #:    [id number]      Weight:       199.3 lb Date of Birth:  September 23, 1956       BSA:          2.019 m Patient Age:    67 years        BP:           156/78 mmHg Patient Gender: M               HR:           53 bpm. Exam  Physician Discharge Summary   Patient: Lawrence [name] MRN: [id number] DOB: December 22, 1955  Admit date:     08/07/2023  Discharge date: 08/08/23  Discharge Physician: Arnetha Courser   PCP: Malva Limes, MD   Recommendations at discharge:  Please obtain CBC and BMP within a week Follow-up with cardiology within a week Follow-up with primary care provider  Discharge Diagnoses: Principal Problem:   Syncope Active Problems:   Nonspecific chest pain   Acute on chronic congestive heart failure (HCC)   Diabetes mellitus without complication (HCC)   Paroxysmal atrial fibrillation (HCC)   HTN (hypertension)   Hospital Course: Taken from H&P.  Lawrence [name] is a 67 y.o. male with medical history significant of CAD status post stenting x 4 CABG in 2016, chronic A-fib on Eliquis, PSVT, HTN, HLD, IIDM, COPD on 2 L oxygen continuously, OSA noncompliant with CPAP, chronic HFpEF, anxiety/depression, celiac disease, chronic pancreatitis, presented with multiple complaints including recurrent chest pains, lightheadedness and frequent falls at home.   Patient has been recently in the hospital for similar recurrent chest pains, he sustained every day with same features centrally located pressure-like radiated to left arm.  In addition, patient also reported that last 2 days he sustained 2 falls and along with prodromes of lightheadedness and blurry vision.  Denied any LOC, no head or neck injuries denies any arm or leg pain.  Recently patient switch his cardiology from [facility] clinic to [facility] cardiology.  So far he visited [facility] cardiology once 2 weeks ago, " they worked me up for slow heart rate" and patient was put on 8 Zio patch for the last 2 weeks.  Patient has history of insulin-dependent diabetes but denies any history of hypoglycemia.   On presentation to ED patient was hemodynamically stable with mild bradycardia, barely positive troponin with a downward trend.  EKG with no significant  changes.  9/22: Vital stable.  Unable to obtain Zio patch interrogation but heart rate remains stable on monitor.  Repeat echocardiogram with new decreased EF and global hypokinesis, most recent echocardiogram was done at [facility] on 07/26/2023 and it shows normal EF and the report. Cardiology made some changes to his medications with starting on Entresto and Aldactone, stopping lisinopril and amlodipine. Continue to have intermittent chest pain.  Patient later requested discharge.  Discussed with cardiology and he will be discharged on Entresto and Aldactone.  Patient need to have a close follow-up with his cardiologist for further recommendations.  Assessment and Plan: * Syncope Patient presented with near syncopal episode, no loss of consciousness.  Recently admitted at [facility] and multiple other admissions at [facility] with persistent atypical chest pain.  At [facility] there was a concern of bradycardia so he was discharged with Zio patch and his home metoprolol and amiodarone was discontinued. Heart rate within goal during current hospitalization. Cardiology was consulted and a repeat echocardiogram with new HFrEF, it was really normal on the most recent 2 echocardiograms. Negative orthostatic vitals -Continue to monitor  Nonspecific chest pain Patient with persistent intermittent atypical chest pain. Had extensive cardiac evaluation which shows nonobstructive disease on a cath done in December 2023. -Continue with home medications which include Plavix, statin, nitrate and ranolazine  Acute on chronic congestive heart failure (HCC) Repeat echocardiogram with EF of 30 to 35% with global hypokinesis.  It was normal on an echo done at [facility] on 07/26/2023.  Grade 2 diastolic dysfunction which remained unchanged from prior studies.  Clinically appears euvolemic. Cardiology made some changes  needed (abdominal pain).   Farxiga 10 MG Tabs tablet Generic drug: dapagliflozin propanediol Take 10 mg by mouth daily.   glipiZIDE 5 MG tablet Commonly known as: GLUCOTROL Take 1 tablet (5 mg total) by mouth daily as needed (sugar over 300).   iron polysaccharides 150 MG capsule Commonly known as: NIFEREX Take 1 capsule (150 mg total) by mouth daily.   isosorbide mononitrate 120 MG 24 hr tablet Commonly known as: IMDUR Take 1 tablet (120 mg total) by mouth daily.   lidocaine 5 % Commonly known as: LIDODERM Place 1 patch onto the skin daily. Remove & Discard patch within 12 hours or as directed by MD Start taking on: August 09, 2023   metFORMIN 500 MG 24 hr tablet Commonly known as: GLUCOPHAGE-XR Take 1 tablet (500 mg total) by mouth 2 (two) times daily. What changed: when to take this   methocarbamol 500 MG tablet Commonly known as: ROBAXIN TAKE 1 TABLET BY MOUTH EVERY 8 HOURS AS NEEDED FOR MUSCLE SPASMS   omega-3 acid ethyl esters 1 g capsule Commonly known as: LOVAZA TAKE 4 CAPSULES (4 GRAMS TOTAL) BY MOUTHDAILY   oxyCODONE-acetaminophen 10-325 MG tablet Commonly known as: PERCOCET TAKE 1 TABLET BY MOUTH EVERY 4 HOURS AS NEEDED FOR PAIN   pantoprazole 40 MG tablet Commonly known as: PROTONIX Take 1 tablet (40 mg total) by mouth 2 (two) times daily for 14 days.   ranolazine 1000 MG SR tablet Commonly known as: RANEXA Take 1 tablet (1,000 mg total) by mouth 2 (two) times daily.   sacubitril-valsartan 24-26 MG Commonly known as: ENTRESTO Take 1 tablet by mouth 2 (two) times daily. Start taking on: August 09, 2023   spironolactone 25 MG tablet Commonly known as: ALDACTONE Take 0.5 tablets (12.5 mg total) by mouth daily. Start taking on: August 09, 2023   traZODone 150 MG tablet Commonly known as: DESYREL TAKE 1 TABLET BY MOUTH AT BEDTIME What changed: how much to take        Discharge Exam: Filed Weights   08/07/23 0052 08/08/23 0500  Weight: 86.2 kg 90.4 kg   General.  Well-developed gentleman, in no acute distress. Pulmonary.  Lungs clear bilaterally, normal respiratory effort. CV.  Regular rate and rhythm, no JVD, rub or murmur. Abdomen.  Soft, nontender, nondistended, BS positive. CNS.  Alert and oriented .  No focal neurologic deficit. Extremities.  No edema, no cyanosis, pulses intact and symmetrical. Psychiatry.  Judgment and insight appears normal.   Condition at discharge: stable  The results of significant diagnostics from this hospitalization (including imaging, microbiology, ancillary and laboratory) are listed below for reference.   Imaging  Studies: ECHOCARDIOGRAM COMPLETE  Result Date: 08/08/2023    ECHOCARDIOGRAM REPORT   Patient Name:   Lawrence [name] [facility] Date of Exam: 08/08/2023 Medical Rec #:  [id number]       Height:       67.0 in Accession #:    [id number]      Weight:       199.3 lb Date of Birth:  September 23, 1956       BSA:          2.019 m Patient Age:    67 years        BP:           156/78 mmHg Patient Gender: M               HR:           53 bpm. Exam  Physician Discharge Summary   Patient: Lawrence [name] MRN: [id number] DOB: December 22, 1955  Admit date:     08/07/2023  Discharge date: 08/08/23  Discharge Physician: Arnetha Courser   PCP: Malva Limes, MD   Recommendations at discharge:  Please obtain CBC and BMP within a week Follow-up with cardiology within a week Follow-up with primary care provider  Discharge Diagnoses: Principal Problem:   Syncope Active Problems:   Nonspecific chest pain   Acute on chronic congestive heart failure (HCC)   Diabetes mellitus without complication (HCC)   Paroxysmal atrial fibrillation (HCC)   HTN (hypertension)   Hospital Course: Taken from H&P.  Lawrence [name] is a 67 y.o. male with medical history significant of CAD status post stenting x 4 CABG in 2016, chronic A-fib on Eliquis, PSVT, HTN, HLD, IIDM, COPD on 2 L oxygen continuously, OSA noncompliant with CPAP, chronic HFpEF, anxiety/depression, celiac disease, chronic pancreatitis, presented with multiple complaints including recurrent chest pains, lightheadedness and frequent falls at home.   Patient has been recently in the hospital for similar recurrent chest pains, he sustained every day with same features centrally located pressure-like radiated to left arm.  In addition, patient also reported that last 2 days he sustained 2 falls and along with prodromes of lightheadedness and blurry vision.  Denied any LOC, no head or neck injuries denies any arm or leg pain.  Recently patient switch his cardiology from [facility] clinic to [facility] cardiology.  So far he visited [facility] cardiology once 2 weeks ago, " they worked me up for slow heart rate" and patient was put on 8 Zio patch for the last 2 weeks.  Patient has history of insulin-dependent diabetes but denies any history of hypoglycemia.   On presentation to ED patient was hemodynamically stable with mild bradycardia, barely positive troponin with a downward trend.  EKG with no significant  changes.  9/22: Vital stable.  Unable to obtain Zio patch interrogation but heart rate remains stable on monitor.  Repeat echocardiogram with new decreased EF and global hypokinesis, most recent echocardiogram was done at [facility] on 07/26/2023 and it shows normal EF and the report. Cardiology made some changes to his medications with starting on Entresto and Aldactone, stopping lisinopril and amlodipine. Continue to have intermittent chest pain.  Patient later requested discharge.  Discussed with cardiology and he will be discharged on Entresto and Aldactone.  Patient need to have a close follow-up with his cardiologist for further recommendations.  Assessment and Plan: * Syncope Patient presented with near syncopal episode, no loss of consciousness.  Recently admitted at [facility] and multiple other admissions at [facility] with persistent atypical chest pain.  At [facility] there was a concern of bradycardia so he was discharged with Zio patch and his home metoprolol and amiodarone was discontinued. Heart rate within goal during current hospitalization. Cardiology was consulted and a repeat echocardiogram with new HFrEF, it was really normal on the most recent 2 echocardiograms. Negative orthostatic vitals -Continue to monitor  Nonspecific chest pain Patient with persistent intermittent atypical chest pain. Had extensive cardiac evaluation which shows nonobstructive disease on a cath done in December 2023. -Continue with home medications which include Plavix, statin, nitrate and ranolazine  Acute on chronic congestive heart failure (HCC) Repeat echocardiogram with EF of 30 to 35% with global hypokinesis.  It was normal on an echo done at [facility] on 07/26/2023.  Grade 2 diastolic dysfunction which remained unchanged from prior studies.  Clinically appears euvolemic. Cardiology made some changes  needed (abdominal pain).   Farxiga 10 MG Tabs tablet Generic drug: dapagliflozin propanediol Take 10 mg by mouth daily.   glipiZIDE 5 MG tablet Commonly known as: GLUCOTROL Take 1 tablet (5 mg total) by mouth daily as needed (sugar over 300).   iron polysaccharides 150 MG capsule Commonly known as: NIFEREX Take 1 capsule (150 mg total) by mouth daily.   isosorbide mononitrate 120 MG 24 hr tablet Commonly known as: IMDUR Take 1 tablet (120 mg total) by mouth daily.   lidocaine 5 % Commonly known as: LIDODERM Place 1 patch onto the skin daily. Remove & Discard patch within 12 hours or as directed by MD Start taking on: August 09, 2023   metFORMIN 500 MG 24 hr tablet Commonly known as: GLUCOPHAGE-XR Take 1 tablet (500 mg total) by mouth 2 (two) times daily. What changed: when to take this   methocarbamol 500 MG tablet Commonly known as: ROBAXIN TAKE 1 TABLET BY MOUTH EVERY 8 HOURS AS NEEDED FOR MUSCLE SPASMS   omega-3 acid ethyl esters 1 g capsule Commonly known as: LOVAZA TAKE 4 CAPSULES (4 GRAMS TOTAL) BY MOUTHDAILY   oxyCODONE-acetaminophen 10-325 MG tablet Commonly known as: PERCOCET TAKE 1 TABLET BY MOUTH EVERY 4 HOURS AS NEEDED FOR PAIN   pantoprazole 40 MG tablet Commonly known as: PROTONIX Take 1 tablet (40 mg total) by mouth 2 (two) times daily for 14 days.   ranolazine 1000 MG SR tablet Commonly known as: RANEXA Take 1 tablet (1,000 mg total) by mouth 2 (two) times daily.   sacubitril-valsartan 24-26 MG Commonly known as: ENTRESTO Take 1 tablet by mouth 2 (two) times daily. Start taking on: August 09, 2023   spironolactone 25 MG tablet Commonly known as: ALDACTONE Take 0.5 tablets (12.5 mg total) by mouth daily. Start taking on: August 09, 2023   traZODone 150 MG tablet Commonly known as: DESYREL TAKE 1 TABLET BY MOUTH AT BEDTIME What changed: how much to take        Discharge Exam: Filed Weights   08/07/23 0052 08/08/23 0500  Weight: 86.2 kg 90.4 kg   General.  Well-developed gentleman, in no acute distress. Pulmonary.  Lungs clear bilaterally, normal respiratory effort. CV.  Regular rate and rhythm, no JVD, rub or murmur. Abdomen.  Soft, nontender, nondistended, BS positive. CNS.  Alert and oriented .  No focal neurologic deficit. Extremities.  No edema, no cyanosis, pulses intact and symmetrical. Psychiatry.  Judgment and insight appears normal.   Condition at discharge: stable  The results of significant diagnostics from this hospitalization (including imaging, microbiology, ancillary and laboratory) are listed below for reference.   Imaging  Studies: ECHOCARDIOGRAM COMPLETE  Result Date: 08/08/2023    ECHOCARDIOGRAM REPORT   Patient Name:   Lawrence [name] [facility] Date of Exam: 08/08/2023 Medical Rec #:  [id number]       Height:       67.0 in Accession #:    [id number]      Weight:       199.3 lb Date of Birth:  September 23, 1956       BSA:          2.019 m Patient Age:    67 years        BP:           156/78 mmHg Patient Gender: M               HR:           53 bpm. Exam  needed (abdominal pain).   Farxiga 10 MG Tabs tablet Generic drug: dapagliflozin propanediol Take 10 mg by mouth daily.   glipiZIDE 5 MG tablet Commonly known as: GLUCOTROL Take 1 tablet (5 mg total) by mouth daily as needed (sugar over 300).   iron polysaccharides 150 MG capsule Commonly known as: NIFEREX Take 1 capsule (150 mg total) by mouth daily.   isosorbide mononitrate 120 MG 24 hr tablet Commonly known as: IMDUR Take 1 tablet (120 mg total) by mouth daily.   lidocaine 5 % Commonly known as: LIDODERM Place 1 patch onto the skin daily. Remove & Discard patch within 12 hours or as directed by MD Start taking on: August 09, 2023   metFORMIN 500 MG 24 hr tablet Commonly known as: GLUCOPHAGE-XR Take 1 tablet (500 mg total) by mouth 2 (two) times daily. What changed: when to take this   methocarbamol 500 MG tablet Commonly known as: ROBAXIN TAKE 1 TABLET BY MOUTH EVERY 8 HOURS AS NEEDED FOR MUSCLE SPASMS   omega-3 acid ethyl esters 1 g capsule Commonly known as: LOVAZA TAKE 4 CAPSULES (4 GRAMS TOTAL) BY MOUTHDAILY   oxyCODONE-acetaminophen 10-325 MG tablet Commonly known as: PERCOCET TAKE 1 TABLET BY MOUTH EVERY 4 HOURS AS NEEDED FOR PAIN   pantoprazole 40 MG tablet Commonly known as: PROTONIX Take 1 tablet (40 mg total) by mouth 2 (two) times daily for 14 days.   ranolazine 1000 MG SR tablet Commonly known as: RANEXA Take 1 tablet (1,000 mg total) by mouth 2 (two) times daily.   sacubitril-valsartan 24-26 MG Commonly known as: ENTRESTO Take 1 tablet by mouth 2 (two) times daily. Start taking on: August 09, 2023   spironolactone 25 MG tablet Commonly known as: ALDACTONE Take 0.5 tablets (12.5 mg total) by mouth daily. Start taking on: August 09, 2023   traZODone 150 MG tablet Commonly known as: DESYREL TAKE 1 TABLET BY MOUTH AT BEDTIME What changed: how much to take        Discharge Exam: Filed Weights   08/07/23 0052 08/08/23 0500  Weight: 86.2 kg 90.4 kg   General.  Well-developed gentleman, in no acute distress. Pulmonary.  Lungs clear bilaterally, normal respiratory effort. CV.  Regular rate and rhythm, no JVD, rub or murmur. Abdomen.  Soft, nontender, nondistended, BS positive. CNS.  Alert and oriented .  No focal neurologic deficit. Extremities.  No edema, no cyanosis, pulses intact and symmetrical. Psychiatry.  Judgment and insight appears normal.   Condition at discharge: stable  The results of significant diagnostics from this hospitalization (including imaging, microbiology, ancillary and laboratory) are listed below for reference.   Imaging  Studies: ECHOCARDIOGRAM COMPLETE  Result Date: 08/08/2023    ECHOCARDIOGRAM REPORT   Patient Name:   Lawrence [name] [facility] Date of Exam: 08/08/2023 Medical Rec #:  [id number]       Height:       67.0 in Accession #:    [id number]      Weight:       199.3 lb Date of Birth:  September 23, 1956       BSA:          2.019 m Patient Age:    67 years        BP:           156/78 mmHg Patient Gender: M               HR:           53 bpm. Exam  needed (abdominal pain).   Farxiga 10 MG Tabs tablet Generic drug: dapagliflozin propanediol Take 10 mg by mouth daily.   glipiZIDE 5 MG tablet Commonly known as: GLUCOTROL Take 1 tablet (5 mg total) by mouth daily as needed (sugar over 300).   iron polysaccharides 150 MG capsule Commonly known as: NIFEREX Take 1 capsule (150 mg total) by mouth daily.   isosorbide mononitrate 120 MG 24 hr tablet Commonly known as: IMDUR Take 1 tablet (120 mg total) by mouth daily.   lidocaine 5 % Commonly known as: LIDODERM Place 1 patch onto the skin daily. Remove & Discard patch within 12 hours or as directed by MD Start taking on: August 09, 2023   metFORMIN 500 MG 24 hr tablet Commonly known as: GLUCOPHAGE-XR Take 1 tablet (500 mg total) by mouth 2 (two) times daily. What changed: when to take this   methocarbamol 500 MG tablet Commonly known as: ROBAXIN TAKE 1 TABLET BY MOUTH EVERY 8 HOURS AS NEEDED FOR MUSCLE SPASMS   omega-3 acid ethyl esters 1 g capsule Commonly known as: LOVAZA TAKE 4 CAPSULES (4 GRAMS TOTAL) BY MOUTHDAILY   oxyCODONE-acetaminophen 10-325 MG tablet Commonly known as: PERCOCET TAKE 1 TABLET BY MOUTH EVERY 4 HOURS AS NEEDED FOR PAIN   pantoprazole 40 MG tablet Commonly known as: PROTONIX Take 1 tablet (40 mg total) by mouth 2 (two) times daily for 14 days.   ranolazine 1000 MG SR tablet Commonly known as: RANEXA Take 1 tablet (1,000 mg total) by mouth 2 (two) times daily.   sacubitril-valsartan 24-26 MG Commonly known as: ENTRESTO Take 1 tablet by mouth 2 (two) times daily. Start taking on: August 09, 2023   spironolactone 25 MG tablet Commonly known as: ALDACTONE Take 0.5 tablets (12.5 mg total) by mouth daily. Start taking on: August 09, 2023   traZODone 150 MG tablet Commonly known as: DESYREL TAKE 1 TABLET BY MOUTH AT BEDTIME What changed: how much to take        Discharge Exam: Filed Weights   08/07/23 0052 08/08/23 0500  Weight: 86.2 kg 90.4 kg   General.  Well-developed gentleman, in no acute distress. Pulmonary.  Lungs clear bilaterally, normal respiratory effort. CV.  Regular rate and rhythm, no JVD, rub or murmur. Abdomen.  Soft, nontender, nondistended, BS positive. CNS.  Alert and oriented .  No focal neurologic deficit. Extremities.  No edema, no cyanosis, pulses intact and symmetrical. Psychiatry.  Judgment and insight appears normal.   Condition at discharge: stable  The results of significant diagnostics from this hospitalization (including imaging, microbiology, ancillary and laboratory) are listed below for reference.   Imaging  Studies: ECHOCARDIOGRAM COMPLETE  Result Date: 08/08/2023    ECHOCARDIOGRAM REPORT   Patient Name:   Lawrence [name] [facility] Date of Exam: 08/08/2023 Medical Rec #:  [id number]       Height:       67.0 in Accession #:    [id number]      Weight:       199.3 lb Date of Birth:  September 23, 1956       BSA:          2.019 m Patient Age:    67 years        BP:           156/78 mmHg Patient Gender: M               HR:           53 bpm. Exam

## 2023-08-08 NOTE — Assessment & Plan Note (Signed)
Heart rate remained controlled.  Home amiodarone and metoprolol was recently discontinued during [facility] admission few days ago. Patient has a ZIO monitor-unable to access the data. -Continue Eliquis -Patient need to follow-up with his [facility] cardiologist for further recommendation

## 2023-08-08 NOTE — Progress Notes (Signed)
  Echocardiogram 2D Echocardiogram has been performed.  Lawrence [name] 08/08/2023, 9:24 AM

## 2023-08-08 NOTE — Assessment & Plan Note (Signed)
>>  ASSESSMENT AND PLAN FOR DIABETES MELLITUS WITHOUT COMPLICATION (HCC) WRITTEN ON 08/08/2023  1:27 PM BY AMIN, SUMAYYA, MD  Patient was on metformin , glipizide  and Farxiga  at home. -Metformin  and Farxiga  was continued -SSI

## 2023-08-08 NOTE — Progress Notes (Signed)
SUBJECTIVE: Patient continues to have intermittent chest pain.   Vitals:   08/08/23 0500 08/08/23 0723 08/08/23 0748 08/08/23 0752  BP: 123/89  110/69 110/69  Pulse: 63  (!) 51 (!) 51  Resp: 18  17   Temp: 97.6 F (36.4 C)  (!) 97.4 F (36.3 C) (!) 97.4 F (36.3 C)  TempSrc:      SpO2: 98% 98% 100% 100%  Weight: 90.4 kg     Height:        Intake/Output Summary (Last 24 hours) at 08/08/2023 1156 Last data filed at 08/08/2023 1145 Gross per 24 hour  Intake 506.37 ml  Output 4080 ml  Net -3573.63 ml    LABS: Basic Metabolic Panel: Recent Labs    08/07/23 0052 08/07/23 0546  NA 139  --   K 3.6  --   CL 103  --   CO2 24  --   GLUCOSE 251*  --   BUN 17  --   CREATININE 0.84 0.72  CALCIUM 9.6  --   MG 1.9  --    Liver Function Tests: Recent Labs    08/07/23 0052  AST 16  ALT 15  ALKPHOS 58  BILITOT 0.6  PROT 6.3*  ALBUMIN 3.4*   No results for input(s): "LIPASE", "AMYLASE" in the last 72 hours. CBC: Recent Labs    08/07/23 0052 08/07/23 0546  WBC 9.7 9.2  NEUTROABS 8.4*  --   HGB 13.0 12.7*  HCT 41.0 40.1  MCV 93.0 93.3  PLT 164 158   Cardiac Enzymes: No results for input(s): "CKTOTAL", "CKMB", "CKMBINDEX", "TROPONINI" in the last 72 hours. BNP: Invalid input(s): "POCBNP" D-Dimer: No results for input(s): "DDIMER" in the last 72 hours. Hemoglobin A1C: No results for input(s): "HGBA1C" in the last 72 hours. Fasting Lipid Panel: Recent Labs    08/08/23 0519  CHOL 105  HDL 43  LDLCALC 31  TRIG 155*  CHOLHDL 2.4   Thyroid Function Tests: No results for input(s): "TSH", "T4TOTAL", "T3FREE", "THYROIDAB" in the last 72 hours.  Invalid input(s): "FREET3" Anemia Panel: No results for input(s): "VITAMINB12", "FOLATE", "FERRITIN", "TIBC", "IRON", "RETICCTPCT" in the last 72 hours.   PHYSICAL EXAM General: Well developed, well nourished, in no acute distress HEENT:  Normocephalic and atramatic Neck:  No JVD.  Lungs: Clear bilaterally to  auscultation and percussion. Heart: HRRR . Normal S1 and S2 without gallops or murmurs.  Abdomen: Bowel sounds are positive, abdomen soft and non-tender  Msk:  Back normal, normal gait. Normal strength and tone for age. Extremities: No clubbing, cyanosis or edema.   Neuro: Alert and oriented X 3. Psych:  Good affect, responds appropriately  TELEMETRY: A-fib with controlled ventricular rate  ASSESSMENT AND PLAN: Chest pain with peak troponin 258 and BNP 398.  Echocardiogram just done revealed LVEF of 34% and in recent past his EF was 50 to 55%.  He is wearing a Zio patch for which interrogation is not possible and will have to be sent to the company to get information.  [facility] placed the Zio patch because of atrial fibrillation with slow ventricular rate after cessation of amiodarone and metoprolol.  His ventricular rate has been in the 70s while in the hospital right now.  He had extensive workup including cardiac catheterization and December 2023 without any significant obstructive disease.  Will maximize his medical therapy.  Also will add Entresto ,Aldactone and stop amlodipine and lisinopril.   ICD-10-CM   1. Nonspecific chest pain  R07.9  Principal Problem:   Syncope Active Problems:   Chest pain    Adrian Blackwater, MD, [facility] 08/08/2023 11:56 AM

## 2023-08-08 NOTE — Progress Notes (Signed)
Progress Note   Patient: Lawrence [name] VQQ:[id number] DOB: [birth year] DOA: 08/07/2023     0 DOS: the patient was seen and examined on 08/08/2023   Brief hospital course: Taken from H&P.  CRISTIAN CRIGER is a 67 y.o. male with medical history significant of CAD status post stenting x 4 CABG in 2016, chronic A-fib on Eliquis, PSVT, HTN, HLD, IIDM, COPD on 2 L oxygen continuously, OSA noncompliant with CPAP, chronic HFpEF, anxiety/depression, celiac disease, chronic pancreatitis, presented with multiple complaints including recurrent chest pains, lightheadedness and frequent falls at home.   Patient has been recently in the hospital for similar recurrent chest pains, he sustained every day with same features centrally located pressure-like radiated to left arm.  In addition, patient also reported that last 2 days he sustained 2 falls and along with prodromes of lightheadedness and blurry vision.  Denied any LOC, no head or neck injuries denies any arm or leg pain.  Recently patient switch his cardiology from [facility] clinic to [facility] cardiology.  So far he visited [facility] cardiology once 2 weeks ago, " they worked me up for slow heart rate" and patient was put on 8 Zio patch for the last 2 weeks.  Patient has history of insulin-dependent diabetes but denies any history of hypoglycemia.   On presentation to ED patient was hemodynamically stable with mild bradycardia, barely positive troponin with a downward trend.  EKG with no significant changes.  9/22: Vital stable.  Unable to obtain Zio patch interrogation but heart rate remains stable on monitor.  Repeat echocardiogram with new decreased EF and global hypokinesis, most recent echocardiogram was done at [facility] on 07/26/2023 and it shows normal EF and the report. Cardiology made some changes to his medications with starting on Entresto and Aldactone, stopping lisinopril and amlodipine. Continue to have intermittent chest pain.    Assessment and Plan: *  Syncope Patient presented with near syncopal episode, no loss of consciousness.  Recently admitted at [facility] and multiple other admissions at [facility] health with persistent atypical chest pain.  At [facility] there was a concern of bradycardia so he was discharged with Zio patch and his home metoprolol and amiodarone was discontinued. Heart rate within goal during current hospitalization. Cardiology was consulted and a repeat echocardiogram with new HFrEF, it was really normal on the most recent 2 echocardiograms. Negative orthostatic vitals -Continue to monitor  Chest pain Patient with persistent intermittent atypical chest pain. Had extensive cardiac evaluation which shows nonobstructive disease on a cath done in December 2023. -Continue with home medications which include Plavix, statin, nitrate and ranolazine  Acute on chronic congestive heart failure (HCC) Repeat echocardiogram with EF of 30 to 35% with global hypokinesis.  It was normal on an echo done at [facility] on 07/26/2023.  Grade 2 diastolic dysfunction which remained unchanged from prior studies.  Clinically appears euvolemic. Cardiology made some changes to his medications and starting him on Entresto and Aldactone, stopping amlodipine and lisinopril. -Monitor volume status closely  Diabetes mellitus without complication (HCC) Patient was on metformin, glipizide and Farxiga at home. -Metformin and Marcelline Deist was continued -SSI  Paroxysmal atrial fibrillation (HCC) Heart rate remained controlled.  Home amiodarone and metoprolol was recently discontinued during [facility] admission few days ago. Patient has a ZIO monitor-unable to access the data. -Continue Eliquis -Patient need to follow-up with his [facility] cardiologist for further recommendation  HTN (hypertension) Blood pressure within goal. -Home amlodipine and lisinopril has been switched with Entresto and Aldactone by cardiology.  Subjective: Patient continued to have some intermittent nonspecific  chest pain which he is getting for a while.  No shortness of breath or orthopnea.  Physical Exam: Vitals:   08/08/23 0748 08/08/23 0752 08/08/23 1201 08/08/23 1203  BP: 110/69 110/69 127/80 127/80  Pulse: (!) 51 (!) 51 (!) 58 (!) 58  Resp: 17  17   Temp: (!) 97.4 F (36.3 C) (!) 97.4 F (36.3 C) 97.6 F (36.4 C) 97.6 F (36.4 C)  TempSrc:      SpO2: 100% 100% 100% 100%  Weight:      Height:       General.  Well-developed gentleman, in no acute distress. Pulmonary.  Lungs clear bilaterally, normal respiratory effort. CV.  Regular rate and rhythm, no JVD, rub or murmur. Abdomen.  Soft, nontender, nondistended, BS positive. CNS.  Alert and oriented .  No focal neurologic deficit. Extremities.  No edema, no cyanosis, pulses intact and symmetrical. Psychiatry.  Judgment and insight appears normal.   Data Reviewed: Prior data reviewed  Family Communication: Discussed with patient  Disposition: Status is: Observation The patient remains OBS appropriate and will d/c before 2 midnights.  Planned Discharge Destination: Home  DVT prophylaxis.  Eliquis Time spent: 45 minutes  This record has been created using Conservation officer, historic buildings. Errors have been sought and corrected,but may not always be located. Such creation errors do not reflect on the standard of care.   Author: Arnetha Courser, MD 08/08/2023 1:29 PM  For on call review [email or web address].

## 2023-08-08 NOTE — Plan of Care (Signed)
  Problem: Education: Goal: Understanding of cardiac disease, CV risk reduction, and recovery process will improve Outcome: Progressing Goal: Individualized Educational Video(s) Outcome: Progressing   Problem: Activity: Goal: Ability to tolerate increased activity will improve Outcome: Progressing   Problem: Cardiac: Goal: Ability to achieve and maintain adequate cardiovascular perfusion will improve Outcome: Progressing

## 2023-08-08 NOTE — Assessment & Plan Note (Signed)
Patient presented with near syncopal episode, no loss of consciousness.  Recently admitted at [facility] and multiple other admissions at [facility] health with persistent atypical chest pain.  At [facility] there was a concern of bradycardia so he was discharged with Zio patch and his home metoprolol and amiodarone was discontinued. Heart rate within goal during current hospitalization. Cardiology was consulted and a repeat echocardiogram with new HFrEF, it was really normal on the most recent 2 echocardiograms. Negative orthostatic vitals -Continue to monitor

## 2023-08-08 NOTE — Assessment & Plan Note (Signed)
Patient with persistent intermittent atypical chest pain. Had extensive cardiac evaluation which shows nonobstructive disease on a cath done in December 2023. -Continue with home medications which include Plavix, statin, nitrate and ranolazine

## 2023-08-08 NOTE — Hospital Course (Addendum)
Taken from H&P.  Lawrence [name] is a 67 y.o. male with medical history significant of CAD status post stenting x 4 CABG in 2016, chronic A-fib on Eliquis, PSVT, HTN, HLD, IIDM, COPD on 2 L oxygen continuously, OSA noncompliant with CPAP, chronic HFpEF, anxiety/depression, celiac disease, chronic pancreatitis, presented with multiple complaints including recurrent chest pains, lightheadedness and frequent falls at home.   Patient has been recently in the hospital for similar recurrent chest pains, he sustained every day with same features centrally located pressure-like radiated to left arm.  In addition, patient also reported that last 2 days he sustained 2 falls and along with prodromes of lightheadedness and blurry vision.  Denied any LOC, no head or neck injuries denies any arm or leg pain.  Recently patient switch his cardiology from [facility] clinic to [facility] cardiology.  So far he visited [facility] cardiology once 2 weeks ago, " they worked me up for slow heart rate" and patient was put on 8 Zio patch for the last 2 weeks.  Patient has history of insulin-dependent diabetes but denies any history of hypoglycemia.   On presentation to ED patient was hemodynamically stable with mild bradycardia, barely positive troponin with a downward trend.  EKG with no significant changes.  9/22: Vital stable.  Unable to obtain Zio patch interrogation but heart rate remains stable on monitor.  Repeat echocardiogram with new decreased EF and global hypokinesis, most recent echocardiogram was done at [facility] on 07/26/2023 and it shows normal EF and the report. Cardiology made some changes to his medications with starting on Entresto and Aldactone, stopping lisinopril and amlodipine. Continue to have intermittent chest pain.  Patient later requested discharge.  Discussed with cardiology and he will be discharged on Entresto and Aldactone.  Patient need to have a close follow-up with his cardiologist for further recommendations.

## 2023-08-08 NOTE — Assessment & Plan Note (Signed)
Repeat echocardiogram with EF of 30 to 35% with global hypokinesis.  It was normal on an echo done at [facility] on 07/26/2023.  Grade 2 diastolic dysfunction which remained unchanged from prior studies.  Clinically appears euvolemic. Cardiology made some changes to his medications and starting him on Entresto and Aldactone, stopping amlodipine and lisinopril. -Monitor volume status closely

## 2023-08-08 NOTE — Assessment & Plan Note (Signed)
Patient was on metformin, glipizide and Farxiga at home. -Metformin and Marcelline Deist was continued -SSI

## 2023-08-09 ENCOUNTER — Telehealth: Payer: Self-pay

## 2023-08-09 ENCOUNTER — Encounter: Payer: Self-pay | Admitting: Oncology

## 2023-08-09 DIAGNOSIS — I5032 Chronic diastolic (congestive) heart failure: Secondary | ICD-10-CM | POA: Diagnosis not present

## 2023-08-09 DIAGNOSIS — I255 Ischemic cardiomyopathy: Secondary | ICD-10-CM | POA: Diagnosis not present

## 2023-08-09 DIAGNOSIS — I252 Old myocardial infarction: Secondary | ICD-10-CM | POA: Diagnosis not present

## 2023-08-09 DIAGNOSIS — E119 Type 2 diabetes mellitus without complications: Secondary | ICD-10-CM | POA: Diagnosis not present

## 2023-08-09 DIAGNOSIS — I480 Paroxysmal atrial fibrillation: Secondary | ICD-10-CM | POA: Diagnosis not present

## 2023-08-09 DIAGNOSIS — K219 Gastro-esophageal reflux disease without esophagitis: Secondary | ICD-10-CM | POA: Diagnosis not present

## 2023-08-09 DIAGNOSIS — J449 Chronic obstructive pulmonary disease, unspecified: Secondary | ICD-10-CM | POA: Diagnosis not present

## 2023-08-09 DIAGNOSIS — I110 Hypertensive heart disease with heart failure: Secondary | ICD-10-CM | POA: Diagnosis not present

## 2023-08-09 DIAGNOSIS — I2510 Atherosclerotic heart disease of native coronary artery without angina pectoris: Secondary | ICD-10-CM | POA: Diagnosis not present

## 2023-08-09 NOTE — Telephone Encounter (Signed)
Copied from CRM [id number]. Topic: General - Other >> Aug 09, 2023 10:46 AM Everette C wrote: Jaquita Rector for CRM: The patient has called to request a letter for their grandson to be their in home caregiver and needs letters from their PCP allowing for their grandson Nysir Krishnamoorthy to remain in home with them.   Please fax letter to Ms. Rinaldo Ratel at [phone] with [facility]  The patient has been told the document is needed today 08/09/23  Please contact the patient further when possible

## 2023-08-10 ENCOUNTER — Telehealth: Payer: Self-pay

## 2023-08-10 ENCOUNTER — Telehealth: Payer: Self-pay | Admitting: Family Medicine

## 2023-08-10 DIAGNOSIS — K219 Gastro-esophageal reflux disease without esophagitis: Secondary | ICD-10-CM | POA: Diagnosis not present

## 2023-08-10 DIAGNOSIS — E119 Type 2 diabetes mellitus without complications: Secondary | ICD-10-CM | POA: Diagnosis not present

## 2023-08-10 DIAGNOSIS — J449 Chronic obstructive pulmonary disease, unspecified: Secondary | ICD-10-CM | POA: Diagnosis not present

## 2023-08-10 DIAGNOSIS — M25552 Pain in left hip: Secondary | ICD-10-CM | POA: Diagnosis not present

## 2023-08-10 DIAGNOSIS — I110 Hypertensive heart disease with heart failure: Secondary | ICD-10-CM | POA: Diagnosis not present

## 2023-08-10 DIAGNOSIS — I5032 Chronic diastolic (congestive) heart failure: Secondary | ICD-10-CM | POA: Diagnosis not present

## 2023-08-10 DIAGNOSIS — R0789 Other chest pain: Secondary | ICD-10-CM | POA: Diagnosis not present

## 2023-08-10 DIAGNOSIS — R110 Nausea: Secondary | ICD-10-CM | POA: Diagnosis not present

## 2023-08-10 DIAGNOSIS — I509 Heart failure, unspecified: Secondary | ICD-10-CM | POA: Diagnosis not present

## 2023-08-10 DIAGNOSIS — I252 Old myocardial infarction: Secondary | ICD-10-CM | POA: Diagnosis not present

## 2023-08-10 DIAGNOSIS — I4891 Unspecified atrial fibrillation: Secondary | ICD-10-CM | POA: Diagnosis not present

## 2023-08-10 DIAGNOSIS — I480 Paroxysmal atrial fibrillation: Secondary | ICD-10-CM | POA: Diagnosis not present

## 2023-08-10 DIAGNOSIS — I2510 Atherosclerotic heart disease of native coronary artery without angina pectoris: Secondary | ICD-10-CM | POA: Diagnosis not present

## 2023-08-10 DIAGNOSIS — R079 Chest pain, unspecified: Secondary | ICD-10-CM | POA: Diagnosis not present

## 2023-08-10 DIAGNOSIS — I255 Ischemic cardiomyopathy: Secondary | ICD-10-CM | POA: Diagnosis not present

## 2023-08-10 DIAGNOSIS — R0602 Shortness of breath: Secondary | ICD-10-CM | POA: Diagnosis not present

## 2023-08-10 NOTE — Telephone Encounter (Unsigned)
Copied from CRM [id number]. Topic: Appointment Scheduling - Scheduling Inquiry for Clinic >> Aug 10, 2023 11:14 AM Marlow Baars wrote: Reason for CRM: Vernona Rieger RN with [facility] called in stating the patient has a hospital follow up next week with his provider on Oct 2. She states he would like to request a Glucometer, have all nebulizer and respiratory treatments at home, and a portable oxygen concentrator at home. He has one at home but it is not portable and would like one to take to appts. Please asst patient further

## 2023-08-10 NOTE — Telephone Encounter (Signed)
Home Health Verbal Orders - Caller/Agency: Judeth Cornfield with [facility] Callback Number: [phone]  Requesting OT Frequency: 1 x week for 6 weeks  Please assist further

## 2023-08-11 ENCOUNTER — Ambulatory Visit: Payer: Self-pay

## 2023-08-11 ENCOUNTER — Other Ambulatory Visit: Payer: Self-pay

## 2023-08-11 ENCOUNTER — Emergency Department
Admission: EM | Admit: 2023-08-11 | Discharge: 2023-08-11 | Disposition: A | Discharge status: Home / Self Care | Payer: Medicare HMO | Attending: Emergency Medicine | Admitting: Emergency Medicine

## 2023-08-11 ENCOUNTER — Other Ambulatory Visit: Payer: Self-pay | Admitting: Family Medicine

## 2023-08-11 ENCOUNTER — Emergency Department: Payer: Medicare HMO

## 2023-08-11 ENCOUNTER — Encounter: Payer: Self-pay | Admitting: *Deleted

## 2023-08-11 DIAGNOSIS — R42 Dizziness and giddiness: Secondary | ICD-10-CM | POA: Diagnosis not present

## 2023-08-11 DIAGNOSIS — I493 Ventricular premature depolarization: Secondary | ICD-10-CM | POA: Diagnosis not present

## 2023-08-11 DIAGNOSIS — I4891 Unspecified atrial fibrillation: Secondary | ICD-10-CM | POA: Diagnosis not present

## 2023-08-11 DIAGNOSIS — I700 Atherosclerosis of aorta: Secondary | ICD-10-CM | POA: Insufficient documentation

## 2023-08-11 DIAGNOSIS — R9431 Abnormal electrocardiogram [ECG] [EKG]: Secondary | ICD-10-CM | POA: Insufficient documentation

## 2023-08-11 DIAGNOSIS — R0602 Shortness of breath: Secondary | ICD-10-CM | POA: Diagnosis not present

## 2023-08-11 DIAGNOSIS — Z951 Presence of aortocoronary bypass graft: Secondary | ICD-10-CM | POA: Insufficient documentation

## 2023-08-11 DIAGNOSIS — R079 Chest pain, unspecified: Secondary | ICD-10-CM | POA: Diagnosis not present

## 2023-08-11 DIAGNOSIS — Z7901 Long term (current) use of anticoagulants: Secondary | ICD-10-CM | POA: Diagnosis not present

## 2023-08-11 DIAGNOSIS — Z955 Presence of coronary angioplasty implant and graft: Secondary | ICD-10-CM | POA: Diagnosis not present

## 2023-08-11 DIAGNOSIS — E119 Type 2 diabetes mellitus without complications: Secondary | ICD-10-CM | POA: Insufficient documentation

## 2023-08-11 DIAGNOSIS — I2510 Atherosclerotic heart disease of native coronary artery without angina pectoris: Secondary | ICD-10-CM | POA: Diagnosis not present

## 2023-08-11 DIAGNOSIS — K900 Celiac disease: Secondary | ICD-10-CM | POA: Insufficient documentation

## 2023-08-11 DIAGNOSIS — G4733 Obstructive sleep apnea (adult) (pediatric): Secondary | ICD-10-CM | POA: Diagnosis not present

## 2023-08-11 DIAGNOSIS — I10 Essential (primary) hypertension: Secondary | ICD-10-CM | POA: Insufficient documentation

## 2023-08-11 DIAGNOSIS — J449 Chronic obstructive pulmonary disease, unspecified: Secondary | ICD-10-CM | POA: Diagnosis not present

## 2023-08-11 DIAGNOSIS — E785 Hyperlipidemia, unspecified: Secondary | ICD-10-CM | POA: Insufficient documentation

## 2023-08-11 DIAGNOSIS — I5032 Chronic diastolic (congestive) heart failure: Secondary | ICD-10-CM | POA: Insufficient documentation

## 2023-08-11 DIAGNOSIS — K861 Other chronic pancreatitis: Secondary | ICD-10-CM | POA: Diagnosis not present

## 2023-08-11 DIAGNOSIS — Z1152 Encounter for screening for COVID-19: Secondary | ICD-10-CM | POA: Insufficient documentation

## 2023-08-11 DIAGNOSIS — R0789 Other chest pain: Secondary | ICD-10-CM | POA: Diagnosis not present

## 2023-08-11 DIAGNOSIS — I110 Hypertensive heart disease with heart failure: Secondary | ICD-10-CM | POA: Insufficient documentation

## 2023-08-11 DIAGNOSIS — Z7401 Bed confinement status: Secondary | ICD-10-CM | POA: Diagnosis not present

## 2023-08-11 LAB — RESP PANEL BY RT-PCR (RSV, FLU A&B, COVID)  RVPGX2
Influenza A by PCR: NEGATIVE
Influenza B by PCR: NEGATIVE
Resp Syncytial Virus by PCR: NEGATIVE
SARS Coronavirus 2 by RT PCR: NEGATIVE

## 2023-08-11 LAB — CBC
HCT: 47.9 % (ref 39.0–52.0)
Hemoglobin: 15.3 g/dL (ref 13.0–17.0)
MCH: 29.8 pg (ref 26.0–34.0)
MCHC: 31.9 g/dL (ref 30.0–36.0)
MCV: 93.4 fL (ref 80.0–100.0)
Platelets: 194 10*3/uL (ref 150–400)
RBC: 5.13 MIL/uL (ref 4.22–5.81)
RDW: 15.7 % — ABNORMAL HIGH (ref 11.5–15.5)
WBC: 7.2 10*3/uL (ref 4.0–10.5)
nRBC: 0 % (ref 0.0–0.2)

## 2023-08-11 LAB — BASIC METABOLIC PANEL
Anion gap: 11 (ref 5–15)
BUN: 11 mg/dL (ref 8–23)
CO2: 23 mmol/L (ref 22–32)
Calcium: 9.3 mg/dL (ref 8.9–10.3)
Chloride: 104 mmol/L (ref 98–111)
Creatinine, Ser: 0.83 mg/dL (ref 0.61–1.24)
GFR, Estimated: >60 mL/min (ref >60)
Glucose, Bld: 167 mg/dL — ABNORMAL HIGH (ref 70–99)
Potassium: 3.1 mmol/L — ABNORMAL LOW (ref 3.5–5.1)
Sodium: 138 mmol/L (ref 135–145)

## 2023-08-11 LAB — TROPONIN I (HIGH SENSITIVITY)
Troponin I (High Sensitivity): 25 ng/L — ABNORMAL HIGH (ref <18)
Troponin I (High Sensitivity): 33 ng/L — ABNORMAL HIGH (ref <18)

## 2023-08-11 MED ORDER — FENTANYL CITRATE PF 50 MCG/ML IJ SOSY
50.0000 ug | PREFILLED_SYRINGE | Freq: Once | INTRAMUSCULAR | Status: AC
  Administered 2023-08-11: 50 ug via INTRAVENOUS
  Filled 2023-08-11: qty 1

## 2023-08-11 NOTE — ED Notes (Signed)
Pt wears oxygen and requires transport--secretary has called EMS awaiting ride back home. Pt given po fluids/food while he waits

## 2023-08-11 NOTE — ED Triage Notes (Signed)
First nurse note: Pt to ED [facility] from home for chest pain woke him up last night. Radiating to left shoulder. Pt in controlled a fib per ems. Wears 2 L [place] at home.  324 asa PTA 20g to R FA Refused nitroglycerin with EMS

## 2023-08-11 NOTE — ED Provider Notes (Signed)
[facility] Provider Note    Event Date/Time   First MD Initiated Contact with Patient 08/11/23 1635     (approximate)   History   Chest Pain   HPI  Lawrence [name] is a 67 y.o. male with a history of CAD status post stents and CABG, A-fib on Eliquis, PSVT, hypertension, hyperlipidemia, diabetes, COPD, OSA, HFpEF, celiac disease, and chronic pancreatitis who presents with chest pain, acute onset today, described as both pressure-like and sharp, rating to his left arm, and similar to pain that he has come to the hospital for previously.  He reports associated shortness of breath and lightheadedness.  He states it is slightly worse than the last time he came to the hospital.  He denies any leg swelling.  He has a cough that also started today, but no fever.  I reviewed the past medical records.  The patient was just admitted from 9/21 to 9/22 for chest pain, lightheadedness, and frequent falls.  He recently switched his cardiology practice to [facility].  He was put on a Zio patch and medications were changed at that time.  He was stable during his admission last week.  He was also admitted from 9/18 to 9/19, and seen in the ED on 9/4 and 9/17.   Physical Exam   Triage Vital Signs: ED Triage Vitals  Encounter Vitals Group     BP 08/11/23 1535 106/84     Systolic BP Percentile --      Diastolic BP Percentile --      Pulse Rate 08/11/23 1535 69     Resp 08/11/23 1535 15     Temp 08/11/23 1535 98.7 F (37.1 C)     Temp Source 08/11/23 1535 Oral     SpO2 08/11/23 1535 97 %     Weight --      Height --      Head Circumference --      Peak Flow --      Pain Score 08/11/23 1535 10     Pain Loc --      Pain Education --      Exclude from Growth Chart --     Most recent vital signs: Vitals:   08/11/23 1910 08/11/23 2011  BP: 131/74 (!) 131/91  Pulse: 71 81  Resp: 18 18  Temp:    SpO2: 96% 98%    General: Awake, no distress.  CV:  Good peripheral  perfusion.  Normal heart sounds. Resp:  Normal effort. Lungs CTAB. Abd:  No distention.  Other:  No peripheral edema.   ED Results / Procedures / Treatments   Labs (all labs ordered are listed, but only abnormal results are displayed) Labs Reviewed  BASIC METABOLIC PANEL - Abnormal; Notable for the following components:      Result Value   Potassium 3.1 (*)    Glucose, Bld 167 (*)    All other components within normal limits  CBC - Abnormal; Notable for the following components:   RDW 15.7 (*)    All other components within normal limits  TROPONIN I (HIGH SENSITIVITY) - Abnormal; Notable for the following components:   Troponin I (High Sensitivity) 25 (*)    All other components within normal limits  TROPONIN I (HIGH SENSITIVITY) - Abnormal; Notable for the following components:   Troponin I (High Sensitivity) 33 (*)    All other components within normal limits  RESP PANEL BY RT-PCR (RSV, FLU A&B, COVID)  [id number]  EKG  ED ECG REPORT I, Dionne Bucy, the attending physician, personally viewed and interpreted this ECG.  Date: 08/11/2023 EKG Time: 1536 Rate: 100 Rhythm: Atrial fibrillation with PVCs QRS Axis: normal Intervals: Prolonged QTc ST/T Wave abnormalities: Nonspecific T wave abnormalities Narrative Interpretation: no evidence of acute ischemia    RADIOLOGY  Chest x-ray: I independently viewed and interpreted the images; there is no focal consolidation or edema  PROCEDURES:  Critical Care performed: No  Procedures   MEDICATIONS ORDERED IN ED: Medications  fentaNYL (SUBLIMAZE) injection 50 mcg (50 mcg Intravenous Given 08/11/23 1840)  fentaNYL (SUBLIMAZE) injection 50 mcg (50 mcg Intravenous Given 08/11/23 2011)     IMPRESSION / MDM / ASSESSMENT AND PLAN / ED COURSE  I reviewed the triage vital signs and the nursing notes.  67 year old male with significant CAD history and other PMH as noted above, including numerous prior ED visits and  admissions for chest pain workups, presents with chest pain today with associated shortness of breath, lightheadedness, and a cough.  Physical exam is unremarkable for acute findings.  EKG is nonischemic.  Initial troponin is 25 which is improved from his recent baseline.  BMP and CBC show no acute findings.  Differential diagnosis includes, but is not limited to, exacerbation of chronic chest pain, unstable angina, NSTEMI, musculoskeletal chest wall pain, GERD, COVID, pneumonia, other infection.  Given that this is a recurrence of a chronic symptom and with his normal vital signs and lack of DVT symptoms, there is no evidence of PE.  There is also no clinical evidence to suggest aortic dissection or other vascular cause.  We will obtain repeat troponin, chest x-ray, COVID swab, and reassess.  Patient's presentation is most consistent with acute presentation with potential threat to life or bodily function.  The patient is on the cardiac monitor to evaluate for evidence of arrhythmia and/or significant heart rate changes.  ----------------------------------------- 9:36 PM on 08/11/2023 -----------------------------------------  Initial and repeat troponin are 25 and 33 respectively, below the patient's recent baseline.  His vital signs are stable.  On reassessment, his pain is improved.  I did consider whether he may benefit from inpatient admission given his significant cardiac history and risk factors.  However, given his multiple recent admissions, chronic pain, and negative enzymes, he is stable for discharge home with close outpatient follow-up.  The patient feels well and is comfortable going home.  I counseled him on the results of workup.  He gave strict return precautions and he expressed understanding.  FINAL CLINICAL IMPRESSION(S) / ED DIAGNOSES   Final diagnoses:  Chest pain, unspecified type     Rx / DC Orders   ED Discharge Orders     None        Note:  This document was  prepared using Dragon voice recognition software and may include unintentional dictation errors.    Dionne Bucy, MD 08/11/23 2355

## 2023-08-11 NOTE — ED Notes (Signed)
called [facility] spoke with rep. Pam she stated a truck is on the way to pick the pt. up to take him home.

## 2023-08-11 NOTE — Discharge Instructions (Signed)
Follow-up with your cardiologist at [facility].  Continue taking your medications as prescribed.  Return to the ER for new, worsening, or persistent severe chest pain, difficulty breathing, weakness or lightheadedness, palpitations, or any other new or worsening symptoms that concern you.

## 2023-08-11 NOTE — ED Triage Notes (Signed)
Pt arrives by [facility] for chest pain.  Pt has hx of multiple visit for same, last one 4 days ago.  Pt states that he feels like an elephant is sitting on his chest with stabbing pain through left arm.

## 2023-08-11 NOTE — Telephone Encounter (Signed)
Chief Complaint: Chest pain to center of chest radiating down left arm Symptoms: SOB, pain 10/10, clammy, nausea, weakness Frequency: Onset last night Pertinent Negatives: Patient denies other symptoms Disposition: [x] ED /[] Urgent Care (no appt availability in office) / [] Appointment(In office/virtual)/ []  [place] [facility]/ [] Home Care/ [] Refused Recommended Disposition /[] [place] [facility]/ []  Follow-up with PCP Additional Notes: Patient called and says he has chest pain to the center of the chest radiating down left arm onset last night, constant 10/10 w/SOB, nausea, clammy. Advised to hold while I call 911. Called and spoke to 911 of [facility] who dispatched ambulance. Patient advised of the dispatch, asked if he wears oxygen, he says at night. Advised him to have his grandson, who is present in the home, to get the oxygen to put on to assist with the breathing. I stayed on the phone with the patient until EMS arrived.    Reason for Disposition  [1] Chest pain lasts > 5 minutes AND [2] history of heart disease (i.e., angina, heart attack, heart failure, bypass surgery, takes nitroglycerin)  Answer Assessment - Initial Assessment Questions 1. LOCATION: "Where does it hurt?"       Center of chest 2. RADIATION: "Does the pain go anywhere else?" (e.g., into neck, jaw, arms, back)     Down left arm 3. ONSET: "When did the chest pain begin?" (Minutes, hours or days)      Last night 4. PATTERN: "Does the pain come and go, or has it been constant since it started?"  "Does it get worse with exertion?"      Constant 6. SEVERITY: "How bad is the pain?"  (e.g., Scale 1-10; mild, moderate, or severe)    - MILD (1-3): doesn't interfere with normal activities     - MODERATE (4-7): interferes with normal activities or awakens from sleep    - SEVERE (8-10): excruciating pain, unable to do any normal activities       10 7. CARDIAC RISK FACTORS: "Do you have any history of heart problems  or risk factors for heart disease?" (e.g., angina, prior heart attack; diabetes, high blood pressure, high cholesterol, smoker, or strong family history of heart disease)     Heart surgery    10. OTHER SYMPTOMS: "Do you have any other symptoms?" (e.g., dizziness, nausea, vomiting, sweating, fever, difficulty breathing, cough)       SOB  Protocols used: Chest Pain-A-AH

## 2023-08-12 DIAGNOSIS — M549 Dorsalgia, unspecified: Secondary | ICD-10-CM | POA: Diagnosis not present

## 2023-08-12 DIAGNOSIS — G8929 Other chronic pain: Secondary | ICD-10-CM | POA: Diagnosis not present

## 2023-08-12 DIAGNOSIS — N400 Enlarged prostate without lower urinary tract symptoms: Secondary | ICD-10-CM | POA: Diagnosis not present

## 2023-08-12 DIAGNOSIS — K219 Gastro-esophageal reflux disease without esophagitis: Secondary | ICD-10-CM | POA: Diagnosis not present

## 2023-08-12 DIAGNOSIS — J449 Chronic obstructive pulmonary disease, unspecified: Secondary | ICD-10-CM | POA: Diagnosis not present

## 2023-08-12 DIAGNOSIS — K760 Fatty (change of) liver, not elsewhere classified: Secondary | ICD-10-CM | POA: Diagnosis not present

## 2023-08-12 DIAGNOSIS — R079 Chest pain, unspecified: Secondary | ICD-10-CM | POA: Diagnosis not present

## 2023-08-12 DIAGNOSIS — Z955 Presence of coronary angioplasty implant and graft: Secondary | ICD-10-CM | POA: Diagnosis not present

## 2023-08-12 DIAGNOSIS — R9431 Abnormal electrocardiogram [ECG] [EKG]: Secondary | ICD-10-CM | POA: Diagnosis not present

## 2023-08-12 DIAGNOSIS — E119 Type 2 diabetes mellitus without complications: Secondary | ICD-10-CM | POA: Diagnosis not present

## 2023-08-12 DIAGNOSIS — R0789 Other chest pain: Secondary | ICD-10-CM | POA: Diagnosis not present

## 2023-08-12 DIAGNOSIS — I25110 Atherosclerotic heart disease of native coronary artery with unstable angina pectoris: Secondary | ICD-10-CM | POA: Diagnosis not present

## 2023-08-13 ENCOUNTER — Other Ambulatory Visit: Payer: Medicare HMO

## 2023-08-13 ENCOUNTER — Inpatient Hospital Stay: Payer: Medicare HMO | Admitting: Family Medicine

## 2023-08-13 DIAGNOSIS — R0789 Other chest pain: Secondary | ICD-10-CM | POA: Diagnosis not present

## 2023-08-13 DIAGNOSIS — R7989 Other specified abnormal findings of blood chemistry: Secondary | ICD-10-CM | POA: Diagnosis not present

## 2023-08-13 DIAGNOSIS — I21A1 Myocardial infarction type 2: Secondary | ICD-10-CM | POA: Diagnosis not present

## 2023-08-13 DIAGNOSIS — K219 Gastro-esophageal reflux disease without esophagitis: Secondary | ICD-10-CM | POA: Diagnosis not present

## 2023-08-13 DIAGNOSIS — I5020 Unspecified systolic (congestive) heart failure: Secondary | ICD-10-CM | POA: Diagnosis not present

## 2023-08-13 DIAGNOSIS — I214 Non-ST elevation (NSTEMI) myocardial infarction: Secondary | ICD-10-CM | POA: Diagnosis not present

## 2023-08-13 DIAGNOSIS — I255 Ischemic cardiomyopathy: Secondary | ICD-10-CM | POA: Diagnosis not present

## 2023-08-13 DIAGNOSIS — I25118 Atherosclerotic heart disease of native coronary artery with other forms of angina pectoris: Secondary | ICD-10-CM | POA: Diagnosis not present

## 2023-08-13 DIAGNOSIS — E119 Type 2 diabetes mellitus without complications: Secondary | ICD-10-CM | POA: Diagnosis not present

## 2023-08-13 DIAGNOSIS — I480 Paroxysmal atrial fibrillation: Secondary | ICD-10-CM | POA: Diagnosis not present

## 2023-08-13 DIAGNOSIS — I5032 Chronic diastolic (congestive) heart failure: Secondary | ICD-10-CM | POA: Diagnosis not present

## 2023-08-13 DIAGNOSIS — I5022 Chronic systolic (congestive) heart failure: Secondary | ICD-10-CM | POA: Diagnosis not present

## 2023-08-13 DIAGNOSIS — I110 Hypertensive heart disease with heart failure: Secondary | ICD-10-CM | POA: Diagnosis not present

## 2023-08-13 DIAGNOSIS — Z951 Presence of aortocoronary bypass graft: Secondary | ICD-10-CM | POA: Diagnosis not present

## 2023-08-13 DIAGNOSIS — R0602 Shortness of breath: Secondary | ICD-10-CM | POA: Diagnosis not present

## 2023-08-13 DIAGNOSIS — I2510 Atherosclerotic heart disease of native coronary artery without angina pectoris: Secondary | ICD-10-CM | POA: Diagnosis not present

## 2023-08-13 DIAGNOSIS — I2089 Other forms of angina pectoris: Secondary | ICD-10-CM | POA: Diagnosis not present

## 2023-08-13 DIAGNOSIS — R110 Nausea: Secondary | ICD-10-CM | POA: Diagnosis not present

## 2023-08-13 DIAGNOSIS — R9431 Abnormal electrocardiogram [ECG] [EKG]: Secondary | ICD-10-CM | POA: Diagnosis not present

## 2023-08-13 DIAGNOSIS — Z955 Presence of coronary angioplasty implant and graft: Secondary | ICD-10-CM | POA: Diagnosis not present

## 2023-08-13 DIAGNOSIS — Z66 Do not resuscitate: Secondary | ICD-10-CM | POA: Diagnosis not present

## 2023-08-13 DIAGNOSIS — E440 Moderate protein-calorie malnutrition: Secondary | ICD-10-CM | POA: Diagnosis not present

## 2023-08-13 DIAGNOSIS — J449 Chronic obstructive pulmonary disease, unspecified: Secondary | ICD-10-CM | POA: Diagnosis not present

## 2023-08-13 DIAGNOSIS — R079 Chest pain, unspecified: Secondary | ICD-10-CM | POA: Diagnosis not present

## 2023-08-13 DIAGNOSIS — E785 Hyperlipidemia, unspecified: Secondary | ICD-10-CM | POA: Diagnosis not present

## 2023-08-13 DIAGNOSIS — I252 Old myocardial infarction: Secondary | ICD-10-CM | POA: Diagnosis not present

## 2023-08-13 NOTE — Patient Outreach (Signed)
Care Management   Visit Note  08/13/2023 Name: Lawrence [name] MRN: [id number] DOB: [birth year]  Subjective: Lawrence [name] is a 67 y.o. year old male who is a primary care patient of [name], Demetrios Isaacs, MD. The Care Management team was consulted for assistance.      Engaged with patient spoke with patient by telephone.  Patient was evaluated for chest pain at [facility] Emergency Department today.  Assessment:  Outpatient Encounter Medications as of 08/13/2023  Medication Sig   Accu-Chek Softclix Lancets lancets Use as instructed to check sugar daily for type 2 diabetes.   allopurinol (ZYLOPRIM) 300 MG tablet TAKE 1 TABLET BY MOUTH TWICE A DAY (Patient not taking: Reported on 08/23/2023)   ALPRAZolam (XANAX) 1 MG tablet Take 1 tablet (1 mg total) by mouth 3 (three) times daily as needed. (Patient taking differently: Take 1 mg by mouth 3 (three) times daily.)   atorvastatin (LIPITOR) 80 MG tablet TAKE 1 TABLET BY MOUTH AT BEDTIME   Blood Glucose Calibration (ACCU-CHEK GUIDE CONTROL) LIQD Use with blood glucose monitor as directed   Blood Glucose Monitoring Suppl (ACCU-CHEK GUIDE) w/Device KIT Use to check blood sugars as directed   Budeson-Glycopyrrol-Formoterol (BREZTRI AEROSPHERE) 160-9-4.8 MCG/ACT AERO Inhale 2 puffs into the lungs 2 (two) times daily.   cetirizine (ZYRTEC) 10 MG tablet TAKE 1 TABLET BY MOUTH AT BEDTIME (Patient not taking: Reported on 08/23/2023)   clonazePAM (KLONOPIN) 0.5 MG tablet Take 0.5 mg by mouth 2 (two) times daily. (Patient not taking: Reported on 08/23/2023)   dicyclomine (BENTYL) 10 MG capsule Take 1 capsule (10 mg total) by mouth 3 (three) times daily as needed (abdominal pain). (Patient not taking: Reported on 08/23/2023)   FARXIGA 10 MG TABS tablet Take 10 mg by mouth daily.   glipiZIDE (GLUCOTROL) 5 MG tablet Take 1 tablet (5 mg total) by mouth daily as needed (sugar over 300). (Patient not taking: Reported on 08/23/2023)   glucose blood (ACCU-CHEK GUIDE) test strip  Use as instructed to check sugar daily for type 2 diabetes.   iron polysaccharides (NIFEREX) 150 MG capsule Take 1 capsule (150 mg total) by mouth daily.   lidocaine (LIDODERM) 5 % Place 1 patch onto the skin daily. Remove & Discard patch within 12 hours or as directed by MD (Patient not taking: Reported on 08/23/2023)   metFORMIN (GLUCOPHAGE-XR) 500 MG 24 hr tablet Take 1 tablet (500 mg total) by mouth 2 (two) times daily. (Patient taking differently: Take 500 mg by mouth daily.)   methocarbamol (ROBAXIN) 500 MG tablet TAKE 1 TABLET BY MOUTH EVERY 8 HOURS AS NEEDED FOR MUSCLE SPASMS   omega-3 acid ethyl esters (LOVAZA) 1 g capsule TAKE 4 CAPSULES (4 GRAMS TOTAL) BY MOUTHDAILY   oxyCODONE-acetaminophen (PERCOCET) 10-325 MG tablet TAKE 1 TABLET BY MOUTH EVERY 4 HOURS AS NEEDED FOR PAIN   pantoprazole (PROTONIX) 40 MG tablet Take 1 tablet (40 mg total) by mouth 2 (two) times daily for 14 days.   ranolazine (RANEXA) 1000 MG SR tablet Take 1 tablet (1,000 mg total) by mouth 2 (two) times daily.   sacubitril-valsartan (ENTRESTO) 24-26 MG Take 1 tablet by mouth 2 (two) times daily.   spironolactone (ALDACTONE) 25 MG tablet Take 0.5 tablets (12.5 mg total) by mouth daily. (Patient taking differently: Take 25 mg by mouth daily.)   traZODone (DESYREL) 150 MG tablet TAKE 1 TABLET BY MOUTH AT BEDTIME (Patient taking differently: Take 100 mg by mouth at bedtime.)   [DISCONTINUED] apixaban (ELIQUIS) 5 MG TABS  tablet Take 1 tablet (5 mg total) by mouth 2 (two) times daily.   [DISCONTINUED] clopidogrel (PLAVIX) 75 MG tablet Take 1 tablet (75 mg total) by mouth daily.   [DISCONTINUED] isosorbide mononitrate (IMDUR) 120 MG 24 hr tablet Take 1 tablet (120 mg total) by mouth daily.   No facility-administered encounter medications on file as of 08/13/2023.     Interventions:   Goals Addressed             This Visit's Progress    Care Management       Current Barriers:  Care Management support and education  needs related to Multiple Chronic Diseases Requires additional Care Giver support in the home     Heart Failure Interventions:   Patient was evaluated for recently hospitalized at [facility]. He returned to the [facility] Emergency Room on 9/18 and 9/19 due to chest pain. Reports he has transitioned heart care to [facility] Cardiology. He was being followed by [facility] Cardiology.  Reports completing extensive work ups r/t cause of non specific chest pain. he is currently wearing a heart monitor/Zio. Anticipates it being removed when he returns to [facility] Cardiology. Reviewed medications. Reports taking as prescribed. Reports his daughter in law is currently assisting with medication management via pillbox preparation.  Discussed symptoms since last Emergency Room visit. He has experienced episodes of chest pain but reports doing well today. Thorough discussion regarding need to call 911 or return to the ER if his symptoms worsen. Reviewed established weight parameters. Reminded of need to notify a provider for weight gain greater than 3 lbs overnight or weight gain greater than 5 lbs within a week. Reports weight today was 184 lbs. Reviewed s/sx of fluid overload and indications for notifying a provider. Activity tolerance remains limited and he experiences dyspnea with minimal exertion. Using supplemental O2 at 2L/min. Denies increased abdominal or lower extremity edema.  Anticipates his grandson arriving to provide caregiver services. Reports significant decline in activity tolerance. Reports his son is currently assisting. Plan is for his grandson to relocate and serve as his caregiver.  Reports receiving call from [facility] to start Home Health Nursing and Physical therapy.  Thorough discussion regarding worsening s/sx related to CHF exacerbation and indications for seeking immediate medical attention. Update 08/13/23: [name] was admitted for chest pain since outreach last week. Reports being admitted on 9/21 and  returning to the ER 9/24, 9/25, 9/26 and again earlier today. Reports chest pain is only relieved with IV or injectable pain medications. He will continue close follow up with the [facility] Cardiology team. Confirmed that his grandson is currently at the apartment to assist. Anticipates returning to [facility] per Cardiology request.  Reports speaking with [employer]. A message was left for housing manager to contact RNCM regarding needed documentation from the Primary Care Provider. He was unable to attend scheduled PCP visit today. Pending hospital follow up on 08/18/23. Thorough discussion with Mr. Sein regarding worsening s/sx and indications for calling 911 or returning to the Emergency Room.    Self-Care Activities/Patient Goals: Take all medications as prescribed Keep scheduled appointments with the Cardiology team Monitor weight and record readings Notify provider for weight gain outside of established parameters Limit sodium intake Monitor symptoms and seek immediate medical attention if symptoms worsen             PLAN Will follow up next week.   Katina Degree Health  [facility], [facility] RN Care Manager Direct Dial: [phone] Website: [email or web address]

## 2023-08-13 NOTE — Patient Instructions (Addendum)
Thank you for allowing the  Care Management team to participate in your care.   Patient Instructions: You are scheduled for a follow up appointment with Dr. Sherrie Mustache on Wednesday 08/18/23 at 1120.  Please return to the Emergency Room or call 911 if your symptoms return and/or worsen over the weekend. I will contact [employer] regarding their request for documentation related to you requiring a caregiver.    Please do not hesitate to call if you have questions or require assistance prior to your clinic visit.    Katina Degree Health  [facility], [facility] RN Care Manager Direct Dial: [phone] Website: [email or web address]

## 2023-08-15 DIAGNOSIS — I480 Paroxysmal atrial fibrillation: Secondary | ICD-10-CM | POA: Diagnosis not present

## 2023-08-16 ENCOUNTER — Other Ambulatory Visit: Payer: Medicare HMO

## 2023-08-16 ENCOUNTER — Telehealth: Payer: Self-pay

## 2023-08-16 ENCOUNTER — Other Ambulatory Visit: Payer: Self-pay | Admitting: Family Medicine

## 2023-08-16 DIAGNOSIS — I5032 Chronic diastolic (congestive) heart failure: Secondary | ICD-10-CM | POA: Diagnosis not present

## 2023-08-16 DIAGNOSIS — J449 Chronic obstructive pulmonary disease, unspecified: Secondary | ICD-10-CM | POA: Diagnosis not present

## 2023-08-16 DIAGNOSIS — R079 Chest pain, unspecified: Secondary | ICD-10-CM | POA: Diagnosis not present

## 2023-08-16 DIAGNOSIS — I252 Old myocardial infarction: Secondary | ICD-10-CM | POA: Diagnosis not present

## 2023-08-16 DIAGNOSIS — R7989 Other specified abnormal findings of blood chemistry: Secondary | ICD-10-CM | POA: Diagnosis not present

## 2023-08-16 DIAGNOSIS — R0602 Shortness of breath: Secondary | ICD-10-CM | POA: Diagnosis not present

## 2023-08-16 DIAGNOSIS — I110 Hypertensive heart disease with heart failure: Secondary | ICD-10-CM | POA: Diagnosis not present

## 2023-08-16 DIAGNOSIS — R110 Nausea: Secondary | ICD-10-CM | POA: Diagnosis not present

## 2023-08-16 DIAGNOSIS — E119 Type 2 diabetes mellitus without complications: Secondary | ICD-10-CM | POA: Diagnosis not present

## 2023-08-16 DIAGNOSIS — I255 Ischemic cardiomyopathy: Secondary | ICD-10-CM | POA: Diagnosis not present

## 2023-08-16 DIAGNOSIS — I2510 Atherosclerotic heart disease of native coronary artery without angina pectoris: Secondary | ICD-10-CM | POA: Diagnosis not present

## 2023-08-16 DIAGNOSIS — I480 Paroxysmal atrial fibrillation: Secondary | ICD-10-CM | POA: Diagnosis not present

## 2023-08-16 DIAGNOSIS — K219 Gastro-esophageal reflux disease without esophagitis: Secondary | ICD-10-CM | POA: Diagnosis not present

## 2023-08-16 NOTE — Patient Outreach (Signed)
Care Management   Visit Note  08/16/2023 Name: Lawrence [name] MRN: [id number] DOB: 02/18/1956  Subjective: Lawrence [name] is a 67 y.o. year old male who is a primary care patient of [name], Demetrios Isaacs, MD. The Care Management team was consulted for assistance.      Engaged with patient by telephone.  Assessment:  Outpatient Encounter Medications as of 08/16/2023  Medication Sig   Accu-Chek Softclix Lancets lancets Use as instructed to check sugar daily for type 2 diabetes.   allopurinol (ZYLOPRIM) 300 MG tablet TAKE 1 TABLET BY MOUTH TWICE A DAY (Patient not taking: Reported on 08/23/2023)   ALPRAZolam (XANAX) 1 MG tablet Take 1 tablet (1 mg total) by mouth 3 (three) times daily as needed. (Patient taking differently: Take 1 mg by mouth 3 (three) times daily.)   atorvastatin (LIPITOR) 80 MG tablet TAKE 1 TABLET BY MOUTH AT BEDTIME   Blood Glucose Calibration (ACCU-CHEK GUIDE CONTROL) LIQD Use with blood glucose monitor as directed   Blood Glucose Monitoring Suppl (ACCU-CHEK GUIDE) w/Device KIT Use to check blood sugars as directed   Budeson-Glycopyrrol-Formoterol (BREZTRI AEROSPHERE) 160-9-4.8 MCG/ACT AERO Inhale 2 puffs into the lungs 2 (two) times daily.   cetirizine (ZYRTEC) 10 MG tablet TAKE 1 TABLET BY MOUTH AT BEDTIME (Patient not taking: Reported on 08/23/2023)   clonazePAM (KLONOPIN) 0.5 MG tablet Take 0.5 mg by mouth 2 (two) times daily. (Patient not taking: Reported on 08/23/2023)   clopidogrel (PLAVIX) 75 MG tablet TAKE 1 TABLET BY MOUTH DAILY   dicyclomine (BENTYL) 10 MG capsule Take 1 capsule (10 mg total) by mouth 3 (three) times daily as needed (abdominal pain). (Patient not taking: Reported on 08/23/2023)   ELIQUIS 5 MG TABS tablet TAKE 1 TABLET BY MOUTH TWICE A DAY   FARXIGA 10 MG TABS tablet Take 10 mg by mouth daily.   glipiZIDE (GLUCOTROL) 5 MG tablet Take 1 tablet (5 mg total) by mouth daily as needed (sugar over 300). (Patient not taking: Reported on 08/23/2023)   glucose  blood (ACCU-CHEK GUIDE) test strip Use as instructed to check sugar daily for type 2 diabetes.   iron polysaccharides (NIFEREX) 150 MG capsule Take 1 capsule (150 mg total) by mouth daily.   isosorbide mononitrate (IMDUR) 120 MG 24 hr tablet TAKE 1 TABLET BY MOUTH DAILY   lidocaine (LIDODERM) 5 % Place 1 patch onto the skin daily. Remove & Discard patch within 12 hours or as directed by MD (Patient not taking: Reported on 08/23/2023)   metFORMIN (GLUCOPHAGE-XR) 500 MG 24 hr tablet Take 1 tablet (500 mg total) by mouth 2 (two) times daily. (Patient taking differently: Take 500 mg by mouth daily.)   methocarbamol (ROBAXIN) 500 MG tablet TAKE 1 TABLET BY MOUTH EVERY 8 HOURS AS NEEDED FOR MUSCLE SPASMS   omega-3 acid ethyl esters (LOVAZA) 1 g capsule TAKE 4 CAPSULES (4 GRAMS TOTAL) BY MOUTHDAILY   oxyCODONE-acetaminophen (PERCOCET) 10-325 MG tablet TAKE 1 TABLET BY MOUTH EVERY 4 HOURS AS NEEDED FOR PAIN   ranolazine (RANEXA) 1000 MG SR tablet Take 1 tablet (1,000 mg total) by mouth 2 (two) times daily.   sacubitril-valsartan (ENTRESTO) 24-26 MG Take 1 tablet by mouth 2 (two) times daily.   spironolactone (ALDACTONE) 25 MG tablet Take 0.5 tablets (12.5 mg total) by mouth daily. (Patient taking differently: Take 25 mg by mouth daily.)   traZODone (DESYREL) 150 MG tablet TAKE 1 TABLET BY MOUTH AT BEDTIME (Patient taking differently: Take 100 mg by mouth at bedtime.)  No facility-administered encounter medications on file as of 08/16/2023.      Interventions:   Goals Addressed             This Visit's Progress    Care Management       Current Barriers:  Care Management support and education needs related to Multiple Chronic Diseases Requires additional Care Giver support in the home   Heart Failure Interventions:   Patient was evaluated for recently hospitalized at [facility]. He returned to the [facility] Emergency Room on 9/18 and 9/19 due to chest pain. Reports he has transitioned heart care to [facility]  Cardiology. He was being followed by [facility] Cardiology.  Reports completing extensive work ups r/t cause of non specific chest pain. he is currently wearing a heart monitor/Zio. Anticipates it being removed when he returns to [facility] Cardiology. Reviewed medications. Reports taking as prescribed. Reports his daughter in law is currently assisting with medication management via pillbox preparation.  Discussed symptoms since last Emergency Room visit. He has experienced episodes of chest pain but reports doing well today. Thorough discussion regarding need to call 911 or return to the ER if his symptoms worsen. Reviewed established weight parameters. Reminded of need to notify a provider for weight gain greater than 3 lbs overnight or weight gain greater than 5 lbs within a week. Reports weight today was 184 lbs. Reviewed s/sx of fluid overload and indications for notifying a provider. Activity tolerance remains limited and he experiences dyspnea with minimal exertion. Using supplemental O2 at 2L/min. Denies increased abdominal or lower extremity edema.  Anticipates his grandson arriving to provide caregiver services. Reports significant decline in activity tolerance. Reports his son is currently assisting. Plan is for his grandson to relocate and serve as his caregiver.  Reports receiving call from [facility] to start Home Health Nursing and Physical therapy.  Thorough discussion regarding worsening s/sx related to CHF exacerbation and indications for seeking immediate medical attention. Update 08/13/23: Lawrence [name] was admitted for chest pain since outreach last week. Reports being admitted on 9/21 and returning to the ER 9/24, 9/25, 9/26 and again earlier today. Reports chest pain is only relieved with IV or injectable pain medications. He will continue close follow up with the [facility] Cardiology team. Confirmed that his grandson is currently at the apartment to assist. Anticipates returning to [facility] per Cardiology  request.  Reports speaking with [employer]. A message was left for housing manager to contact RNCM regarding needed documentation from the Primary Care Provider. He was unable to attend scheduled PCP visit today. Pending hospital follow up on 08/18/23. Thorough discussion with Lawrence [name] regarding worsening s/sx and indications for calling 911 or returning to the Emergency Room. Update 9/30: Patient reports being readmitted to [facility]. Reports pending plan for surgical intervention.  Provided verbal consent to contact son [name] if additional information is needed regarding services that will be required in the home. Patient reports being informed that he would receive delivered meals following hospital discharge. Recall [employer] would be providing the deliveries. Unsure if representative he spoke with was from his insurance provider. Reports meals were scheduled to be delivered on Saturday 08/14/23 however delivery was not received. Will contact staff at [facility] to confirm that he is on the roster for meal delivery. Discussed information provided by [employer]. The Housing Authority requires documentation regarding the need for an additional resident in the home. Anticipates a waiver being granted for Reasonable Accommodations due to patient's multiple chronic illnesses and  need for assistance in the home. Will follow up with PCP to confirm documents are received. Will follow up with [employer] this week.          PLAN  Will follow up this week.   Katina Degree Health  [facility], [facility] RN Care Manager Direct Dial: [phone] Website: [email or web address]

## 2023-08-16 NOTE — Telephone Encounter (Signed)
Please review.  The original prescription was discontinued on 08/05/2023 by Otelia Sergeant, [facility] for the following reason: Discontinued by provider. Renewing this prescription may not be appropriate.

## 2023-08-16 NOTE — Telephone Encounter (Signed)
Copied from CRM [id number]. Topic: General - Other >> Aug 16, 2023  2:23 PM Clide Dales wrote: Patient states that he needs a form faxed stating that his grandson will be living with him as his caregiver to [phone]

## 2023-08-16 NOTE — Telephone Encounter (Signed)
Called patient and asked exactly what he is needing. Per patient is a form stating that his grandson needs to stay with him 24/7. I asked the patient if he has a specific form for Dr.[name] to fill out to bring it with him to the appointment on Wednesday.

## 2023-08-17 DIAGNOSIS — Z955 Presence of coronary angioplasty implant and graft: Secondary | ICD-10-CM | POA: Diagnosis not present

## 2023-08-17 DIAGNOSIS — I2510 Atherosclerotic heart disease of native coronary artery without angina pectoris: Secondary | ICD-10-CM | POA: Diagnosis not present

## 2023-08-17 DIAGNOSIS — I214 Non-ST elevation (NSTEMI) myocardial infarction: Secondary | ICD-10-CM | POA: Diagnosis not present

## 2023-08-17 DIAGNOSIS — R9431 Abnormal electrocardiogram [ECG] [EKG]: Secondary | ICD-10-CM | POA: Diagnosis not present

## 2023-08-18 ENCOUNTER — Inpatient Hospital Stay: Payer: Medicare HMO | Admitting: Family Medicine

## 2023-08-18 ENCOUNTER — Other Ambulatory Visit: Payer: Self-pay

## 2023-08-18 DIAGNOSIS — R9431 Abnormal electrocardiogram [ECG] [EKG]: Secondary | ICD-10-CM | POA: Diagnosis not present

## 2023-08-18 DIAGNOSIS — R7989 Other specified abnormal findings of blood chemistry: Secondary | ICD-10-CM | POA: Diagnosis not present

## 2023-08-20 ENCOUNTER — Other Ambulatory Visit: Payer: Self-pay

## 2023-08-23 ENCOUNTER — Telehealth: Payer: Self-pay

## 2023-08-23 ENCOUNTER — Other Ambulatory Visit: Payer: Self-pay | Admitting: Pharmacist

## 2023-08-23 ENCOUNTER — Telehealth: Payer: Self-pay | Admitting: Family Medicine

## 2023-08-23 DIAGNOSIS — I4891 Unspecified atrial fibrillation: Secondary | ICD-10-CM | POA: Diagnosis not present

## 2023-08-23 DIAGNOSIS — E119 Type 2 diabetes mellitus without complications: Secondary | ICD-10-CM | POA: Diagnosis not present

## 2023-08-23 DIAGNOSIS — I440 Atrioventricular block, first degree: Secondary | ICD-10-CM | POA: Diagnosis not present

## 2023-08-23 DIAGNOSIS — A048 Other specified bacterial intestinal infections: Secondary | ICD-10-CM

## 2023-08-23 DIAGNOSIS — J449 Chronic obstructive pulmonary disease, unspecified: Secondary | ICD-10-CM | POA: Diagnosis not present

## 2023-08-23 DIAGNOSIS — K219 Gastro-esophageal reflux disease without esophagitis: Secondary | ICD-10-CM | POA: Diagnosis not present

## 2023-08-23 DIAGNOSIS — I252 Old myocardial infarction: Secondary | ICD-10-CM | POA: Diagnosis not present

## 2023-08-23 DIAGNOSIS — I471 Supraventricular tachycardia, unspecified: Secondary | ICD-10-CM | POA: Diagnosis not present

## 2023-08-23 DIAGNOSIS — I255 Ischemic cardiomyopathy: Secondary | ICD-10-CM | POA: Diagnosis not present

## 2023-08-23 DIAGNOSIS — I2510 Atherosclerotic heart disease of native coronary artery without angina pectoris: Secondary | ICD-10-CM | POA: Diagnosis not present

## 2023-08-23 DIAGNOSIS — I480 Paroxysmal atrial fibrillation: Secondary | ICD-10-CM | POA: Diagnosis not present

## 2023-08-23 DIAGNOSIS — I5032 Chronic diastolic (congestive) heart failure: Secondary | ICD-10-CM | POA: Diagnosis not present

## 2023-08-23 DIAGNOSIS — I110 Hypertensive heart disease with heart failure: Secondary | ICD-10-CM | POA: Diagnosis not present

## 2023-08-23 NOTE — Progress Notes (Addendum)
08/23/2023 Name: Lawrence [name] MRN: [id number] DOB: 1956-07-25  Chief Complaint  Patient presents with   Medication Management    Lawrence [name] is a 67 y.o. year old male who presented for a telephone visit.   They were referred to the pharmacist by their PCP for assistance in managing medication access.    Subjective:  Care Team: Primary Care Provider: Malva Limes, MD ; Next Scheduled Visit: 09/03/23 Clinical Pharmacist: Marlowe Aschoff, PharmD  Medication Access/Adherence  Current Pharmacy:  [facility] - [place], [place] - [address] [place] [address] Phone: [phone] Fax: [phone]  [facility] - [place], [place] - [address] [place] [address] Phone: [phone] Fax: [phone]  [id number] - [place], [place] - [address] AT [facility] [address] [place] [address] Phone: [phone] Fax: [phone]   Patient reports affordability concerns with their medications: No  Patient reports access/transportation concerns to their pharmacy: No - mailed Patient reports adherence concerns with their medications:  No    *Patient has experienced several hospital discharges lately   Heart Failure:  Current medications:  ACEi/ARB/ARNI: Entresto 24-26mg  SGLT2i: Farxiga 10mg  Beta blocker: Toprol XL 12.5mg  daily (need to confirm) Mineralocorticoid Receptor Antagonist: Spironolactone 25mg  (updated dosing) Diuretic regimen: Bumetanide 2mg  for 7 days  Current home blood pressure readings: Unable to get today Current home weights: 181.8 lb today  Patient denies volume overload signs or symptoms including shortness of breath, lower extremity edema, increased use of pillows at night  Current medication access support: [employer] D-SNP   COPD:  Current medications: Breztri 2 puffs twice a day; rinses mouth out Medications  tried in the past: Albuterol on allergy list (palpitations); Xopenex (ineffective)  Reports chronic shortness of breath, but not chest pain currently   *DM, HLD, and HTN well controlled currently- unable to get reading today but says the levels have been "right on the button" when his son checks it  Objective:  Lab Results  Component Value Date   HGBA1C 6.1 (H) 08/05/2023    Lab Results  Component Value Date   CREATININE 0.83 08/11/2023   BUN 11 08/11/2023   NA 138 08/11/2023   K 3.1 (L) 08/11/2023   CL 104 08/11/2023   CO2 23 08/11/2023    Lab Results  Component Value Date   CHOL 105 08/08/2023   HDL 43 08/08/2023   LDLCALC 31 08/08/2023   LDLDIRECT 122.1 (H) 09/27/2020   TRIG 155 (H) 08/08/2023   CHOLHDL 2.4 08/08/2023    Medications Reviewed Today     Reviewed by Pollie Friar, RPH (Pharmacist) on 08/23/23 at 1453  Med List Status: <None>   Medication Order Taking? Sig Documenting Provider Last Dose Status Informant  Accu-Chek Softclix Lancets lancets [id number] Yes Use as instructed to check sugar daily for type 2 diabetes. Malva Limes, MD Taking Active Self, Pharmacy Records  allopurinol (ZYLOPRIM) 300 MG tablet [id number] No TAKE 1 TABLET BY MOUTH TWICE A DAY  Patient not taking: Reported on 08/23/2023   Malva Limes, MD Not Taking Active Self, Pharmacy Records  ALPRAZolam Prudy Feeler) 1 MG tablet [id number] Yes Take 1 tablet (1 mg total) by mouth 3 (three) times daily as needed.  Patient taking differently: Take 1 mg by mouth 3 (three) times daily.   Malva Limes, MD Taking Active Pharmacy Records, Self  amLODipine (  NORVASC) 5 MG tablet [id number] Yes Take 5 mg by mouth daily. [provider] Taking Active   atorvastatin (LIPITOR) 80 MG tablet [id number] Yes TAKE 1 TABLET BY MOUTH AT BEDTIME [name], Demetrios Isaacs, MD Taking Active Self, Pharmacy Records  Blood Glucose Calibration (ACCU-CHEK GUIDE CONTROL) [name] [id number] Yes Use with blood glucose monitor  as directed [name], Demetrios Isaacs, MD Taking Active Self, Pharmacy Records  Blood Glucose Monitoring Suppl (ACCU-CHEK GUIDE) w/Device Andria Rhein [id number] Yes Use to check blood sugars as directed [name], Demetrios Isaacs, MD Taking Active Self, Pharmacy Records  Budeson-Glycopyrrol-Formoterol [facility] AEROSPHERE) 160-9-4.8 MCG/ACT [name] [id number] Yes Inhale 2 puffs into the lungs 2 (two) times daily. Malva Limes, MD Taking Active Self, Pharmacy Records  bumetanide [facility]) 2 MG tablet [id number] Yes Take 2 mg by mouth daily. As needed for swelling for 7 days [provider] Taking Active   cetirizine (ZYRTEC) 10 MG tablet [id number] No TAKE 1 TABLET BY MOUTH AT BEDTIME  Patient not taking: Reported on 08/23/2023   Malva Limes, MD Not Taking Active Self, Pharmacy Records  clonazePAM [facility]) 0.5 MG tablet [id number] No Take 0.5 mg by mouth 2 (two) times daily.  Patient not taking: Reported on 08/23/2023   [provider] Not Taking Active Pharmacy Records, Self  clopidogrel (PLAVIX) 75 MG tablet [id number] Yes TAKE 1 TABLET BY MOUTH DAILY [name], Demetrios Isaacs, MD Taking Active   dicyclomine (BENTYL) 10 MG capsule [id number] No Take 1 capsule (10 mg total) by mouth 3 (three) times daily as needed (abdominal pain).  Patient not taking: Reported on 08/23/2023   Phineas Semen, MD Not Taking Active Self, Pharmacy Records  ELIQUIS 5 MG TABS tablet [id number] Yes TAKE 1 TABLET BY MOUTH TWICE A DAY Malva Limes, MD Taking Active   FARXIGA 10 MG TABS tablet [id number] Yes Take 10 mg by mouth daily. [provider] Taking Active Self, Pharmacy Records  glipiZIDE (GLUCOTROL) 5 MG tablet [id number] No Take 1 tablet (5 mg total) by mouth daily as needed (sugar over 300).  Patient not taking: Reported on 08/23/2023   Malva Limes, MD Not Taking Active Self, Pharmacy Records  glucose blood (ACCU-CHEK GUIDE) test strip [id number] Yes Use as instructed to check sugar daily for type 2 diabetes. Malva Limes, MD Taking Active Self, Pharmacy Records  iron polysaccharides (NIFEREX) 150 MG capsule [id number] Yes Take 1 capsule (150 mg total) by mouth daily. Enedina Finner, MD Taking Active Self, Pharmacy Records  isosorbide mononitrate (IMDUR) 120 MG 24 hr tablet [id number] Yes TAKE 1 TABLET BY MOUTH DAILY Malva Limes, MD Taking Active   lidocaine (LIDODERM) 5 % [id number] No Place 1 patch onto the skin daily. Remove & Discard patch within 12 hours or as directed by MD  Patient not taking: Reported on 08/23/2023   Arnetha Courser, MD Not Taking Active   metFORMIN (GLUCOPHAGE-XR) 500 MG 24 hr tablet [id number] Yes Take 1 tablet (500 mg total) by mouth 2 (two) times daily.  Patient taking differently: Take 500 mg by mouth daily.   Jacky Kindle, FNP Taking Active Self, Pharmacy Records  methocarbamol (ROBAXIN) 500 MG tablet [id number] Yes TAKE 1 TABLET BY MOUTH EVERY 8 HOURS AS NEEDED FOR MUSCLE SPASMS Malva Limes, MD Taking Active Self, Pharmacy Records  omega-3 acid ethyl esters (LOVAZA) 1 g capsule [id number] Yes TAKE 4 CAPSULES (4 GRAMS TOTAL) BY Miachel Roux, MD Taking Active Self, Pharmacy Records  oxyCODONE-acetaminophen [facility]) 10-325 MG tablet [id number] Yes  TAKE 1 TABLET BY MOUTH EVERY 4 HOURS AS NEEDED FOR PAIN Malva Limes, MD Taking Active   pantoprazole (PROTONIX) 40 MG tablet [id number]  Take 1 tablet (40 mg total) by mouth 2 (two) times daily for 14 days. Celso Amy, PA-C  Expired 08/07/23 2359 Self, Pharmacy Records  ranolazine (RANEXA) 1000 MG SR tablet [id number] Yes Take 1 tablet (1,000 mg total) by mouth 2 (two) times daily. Marcelino Duster, MD Taking Active Self  sacubitril-valsartan (ENTRESTO) 24-26 [place] [id number] Yes Take 1 tablet by mouth 2 (two) times daily. Arnetha Courser, MD Taking Active   spironolactone (ALDACTONE) 25 MG tablet [id number] Yes Take 0.5 tablets (12.5 mg total) by mouth daily.  Patient taking differently: Take 25 mg by mouth daily.    Arnetha Courser, MD Taking Active   traZODone (DESYREL) 150 MG tablet [id number] Yes TAKE 1 TABLET BY MOUTH AT BEDTIME  Patient taking differently: Take 100 mg by mouth at bedtime.   Malva Limes, MD Taking Active Self, Pharmacy Records              Assessment/Plan:   Heart Failure: - Currently appropriately managed - Reviewed appropriate blood pressure monitoring technique and reviewed goal blood pressure - Reviewed to weigh daily and when to contact cardiology with weight gain - Reviewed dietary modifications including limiting salty foods - Recommend to check daily weights and continue monitoring swelling     COPD: - Currently controlled.  - Reviewed appropriate inhaler technique. - Recommend to get new nebulizer as recommended  - Need to get nebulizer and new O2 tank potentially (mentioned within chart notes)   **Follow Up Plan:**  - Transfer Spironolactone and Bumetanide from Outpatient Pharmacy to [facility] to be mailed - Currently taking Torsemide 100mg  half tablet each morning for swelling currently while he does NOT have access to Bumetanide prescribed - Needs new nebulizer- says someone is helping him with access; continues using Breztri twice a day as prescribed - Call daughter-in-law for full medication review as she puts them in a weekly pillbox   Questions for daughter-in-law/son (same phone number provided & unable to reach today): - Taking Trazodone 150mg  (says 100mg  on list) - Started Toprol XL 25mg  1/2 tablet daily?  - Taking Amlodipine 5mg  daily?  - Need a Lovaza refill soon? - Aspirin stopped? - Taking Entresto and stopped Lisinopril appropriately? - Any new OTC supplements?  Marlowe Aschoff, PharmD [facility] Phone Number: [phone]

## 2023-08-23 NOTE — Transitions of Care (Post Inpatient/ED Visit) (Signed)
08/23/2023  Name: Lawrence [name] MRN: [id number] DOB: 02/17/1956  Today's TOC FU Call Status: Today's TOC FU Call Status:: Successful TOC FU Call Completed TOC FU Call Complete Date: 08/23/23 Patient's Name and Date of Birth confirmed.  Transition Care Management Follow-up Telephone Call Date of Discharge: 08/19/23 Discharge Facility: Other ([facility]) Name of Other ([facility]) Discharge Facility: [facility] Type of Discharge: Inpatient Admission Primary Inpatient Discharge Diagnosis:: NSTEMI How have you been since you were released from the hospital?: Better (Denies any chest pain.  [facility] came to see patient today.) Any questions or concerns?: No  Items Reviewed: Did you receive and understand the discharge instructions provided?: Yes Medications obtained,verified, and reconciled?: Yes (Medications Reviewed) Any new allergies since your discharge?: No Dietary orders reviewed?: Yes Type of Diet Ordered:: low salt heart healtjy diet Do you have support at home?: Yes People in Home: child(ren), adult Name of Support/Comfort Primary Source: Welford Guzek.  Medications Reviewed Today: Medications Reviewed Today     Reviewed by Earlie Server, RN (Registered Nurse) on 08/23/23 at 1519  Med List Status: <None>   Medication Order Taking? Sig Documenting Provider Last Dose Status Informant  Accu-Chek Softclix Lancets lancets [id number] No Use as instructed to check sugar daily for type 2 diabetes. Malva Limes, MD Taking Active Self, Pharmacy Records  allopurinol (ZYLOPRIM) 300 MG tablet [id number] No TAKE 1 TABLET BY MOUTH TWICE A DAY  Patient not taking: Reported on 08/23/2023   Malva Limes, MD Not Taking Active Self, Pharmacy Records  ALPRAZolam Prudy Feeler) 1 MG tablet [id number] No Take 1 tablet (1 mg total) by mouth 3 (three) times daily as needed.  Patient taking differently: Take 1 mg by mouth 3 (three) times daily.   Malva Limes, MD Taking Active Pharmacy Records,  Self  amLODipine [facility]) 5 MG tablet [id number] No Take 5 mg by mouth daily. [provider] Taking Active   atorvastatin (LIPITOR) 80 MG tablet [id number] No TAKE 1 TABLET BY MOUTH AT BEDTIME [name], Demetrios Isaacs, MD Taking Active Self, Pharmacy Records  Blood Glucose Calibration (ACCU-CHEK GUIDE CONTROL) [name] [id number] No Use with blood glucose monitor as directed [name], Demetrios Isaacs, MD Taking Active Self, Pharmacy Records  Blood Glucose Monitoring Suppl (ACCU-CHEK GUIDE) w/Device KIT [id number] No Use to check blood sugars as directed Malva Limes, MD Taking Active Self, Pharmacy Records  Budeson-Glycopyrrol-Formoterol [facility] AEROSPHERE) 160-9-4.8 MCG/ACT AERO [id number] No Inhale 2 puffs into the lungs 2 (two) times daily. Malva Limes, MD Taking Active Self, Pharmacy Records  bumetanide [facility]) 2 MG tablet [id number] No Take 2 mg by mouth daily. As needed for swelling for 7 days [provider] Taking Active   cetirizine (ZYRTEC) 10 MG tablet [id number] No TAKE 1 TABLET BY MOUTH AT BEDTIME  Patient not taking: Reported on 08/23/2023   Malva Limes, MD Not Taking Active Self, Pharmacy Records  clonazePAM [facility] 0.5 MG tablet [id number] No Take 0.5 mg by mouth 2 (two) times daily.  Patient not taking: Reported on 08/23/2023   [provider] Not Taking Active Pharmacy Records, Self  clopidogrel (PLAVIX) 75 MG tablet [id number] No TAKE 1 TABLET BY MOUTH DAILY [name], Demetrios Isaacs, MD Taking Active   dicyclomine (BENTYL) 10 MG capsule [id number] No Take 1 capsule (10 mg total) by mouth 3 (three) times daily as needed (abdominal pain).  Patient not taking: Reported on 08/23/2023   Phineas Semen, MD Not Taking Active Self, Pharmacy Records  ELIQUIS 5 MG TABS tablet [id number]  No TAKE 1 TABLET BY MOUTH TWICE A DAY Malva Limes, MD Taking Active   FARXIGA 10 MG TABS tablet [id number] No Take 10 mg by mouth daily. [provider] Taking Active Self, Pharmacy  Records  glipiZIDE (GLUCOTROL) 5 MG tablet [id number] No Take 1 tablet (5 mg total) by mouth daily as needed (sugar over 300).  Patient not taking: Reported on 08/23/2023   Malva Limes, MD Not Taking Active Self, Pharmacy Records  glucose blood (ACCU-CHEK GUIDE) test strip [id number] No Use as instructed to check sugar daily for type 2 diabetes. Malva Limes, MD Taking Active Self, Pharmacy Records  iron polysaccharides (NIFEREX) 150 MG capsule [id number] No Take 1 capsule (150 mg total) by mouth daily. Enedina Finner, MD Taking Active Self, Pharmacy Records  isosorbide mononitrate (IMDUR) 120 MG 24 hr tablet [id number] No TAKE 1 TABLET BY MOUTH DAILY Malva Limes, MD Taking Active   lidocaine (LIDODERM) 5 % [id number] No Place 1 patch onto the skin daily. Remove & Discard patch within 12 hours or as directed by MD  Patient not taking: Reported on 08/23/2023   Arnetha Courser, MD Not Taking Active   metFORMIN (GLUCOPHAGE-XR) 500 MG 24 hr tablet [id number] No Take 1 tablet (500 mg total) by mouth 2 (two) times daily.  Patient taking differently: Take 500 mg by mouth daily.   Jacky Kindle, FNP Taking Active Self, Pharmacy Records  methocarbamol (ROBAXIN) 500 MG tablet [id number] No TAKE 1 TABLET BY MOUTH EVERY 8 HOURS AS NEEDED FOR MUSCLE SPASMS Malva Limes, MD Taking Active Self, Pharmacy Records  omega-3 acid ethyl esters (LOVAZA) 1 g capsule [id number] No TAKE 4 CAPSULES (4 GRAMS TOTAL) BY Miachel Roux, MD Taking Active Self, Pharmacy Records  oxyCODONE-acetaminophen [facility]) 10-325 MG tablet [id number] No TAKE 1 TABLET BY MOUTH EVERY 4 HOURS AS NEEDED FOR PAIN Malva Limes, MD Taking Active   pantoprazole (PROTONIX) 40 MG tablet [id number] No Take 1 tablet (40 mg total) by mouth 2 (two) times daily for 14 days. Celso Amy, PA-C Past Week Expired 08/07/23 2359 Self, Pharmacy Records  ranolazine (RANEXA) 1000 MG SR tablet [id number] No Take 1 tablet (1,000 mg total) by  mouth 2 (two) times daily. Marcelino Duster, MD Taking Active Self  sacubitril-valsartan (ENTRESTO) 24-26 MG [id number] No Take 1 tablet by mouth 2 (two) times daily. Arnetha Courser, MD Taking Active   spironolactone (ALDACTONE) 25 MG tablet [id number] No Take 0.5 tablets (12.5 mg total) by mouth daily.  Patient taking differently: Take 25 mg by mouth daily.   Arnetha Courser, MD Taking Active   traZODone (DESYREL) 150 MG tablet [id number] No TAKE 1 TABLET BY MOUTH AT BEDTIME  Patient taking differently: Take 100 mg by mouth at bedtime.   Malva Limes, MD Taking Active Self, Pharmacy Records            Home Care and Equipment/Supplies: Were Home Health Services Ordered?: Yes Name of Home Health Agency:: [facility] Has Agency set up a time to come to your home?: Yes First Home Health Visit Date: 08/23/23 Any new equipment or medical supplies ordered?: No  Functional Questionnaire: Do you need assistance with bathing/showering or dressing?: No Do you need assistance with meal preparation?: No Do you need assistance with eating?: No Do you have difficulty maintaining continence: No Do you need assistance with getting out of bed/getting out of a chair/moving?: Yes (grandson assist with getting in and out of bed.) Do you have difficulty  managing or taking your medications?: No (daughter in law does a pill planner)  Follow up appointments reviewed: PCP Follow-up appointment confirmed?: Yes Date of PCP follow-up appointment?: 09/03/23 Follow-up Provider: Dr. Sherrie Mustache Specialist [facility] Follow-up appointment confirmed?: Yes Date of Specialist follow-up appointment?: 09/15/23 Follow-Up Specialty Provider:: cardiologist Do you need transportation to your follow-up appointment?: No Do you understand care options if your condition(s) worsen?: Yes-patient verbalized understanding  SDOH Interventions Today    Flowsheet Row Most Recent Value  SDOH Interventions   Food Insecurity  Interventions Other (Comment)  [Referral to social worker to inquire about MOMs meals previously set up.]        Goals Addressed               This Visit's Progress     Patient will report no readmissions to the hospital in the next 30 days. (pt-stated)        Current Barriers:  Diet/Nutrition/Food Resources patient reports no food.  MOMS meals have not shown up.  Provider appointments   Understanding of low salt diet  RNCM Clinical Goal(s):  Patient will work with the Care Management team over the next 30 days to address Transition of Care Barriers: Medication Management Diet/Nutrition/Food Resources Home Health services take all medications exactly as prescribed and will call provider for medication related questions as evidenced by patient report.   attend all scheduled medical appointments: PCP and specialist as evidenced by patient report and review of EMR. work with [employer] care guide to address needs related to  Limited access to food as evidenced by patient and/or community resource care guide support through collaboration with Medical illustrator, provider, and care team.   Interventions: Evaluation of current treatment plan related to  self management and patient's adherence to plan as established by provider Reviewed when to call the MD Reviewed importance of taking all medications as prescribed. Reviewed chest pain and when to call 911. Placed order for social work regarding food insecurity. Encouraged patient to follow his low salt diet.   Transitions of Care:  New goal. Doctor Visits  - discussed the importance of doctor visits Communication with social worker re: referral placed as patient reports MOMS meals has not shown up and "he has no food"    Patient Goals/Self-Care Activities: Participate in Transition of Care Program/Attend TOC scheduled calls Notify RN Care Manager of TOC call rescheduling needs Take all medications as prescribed Attend all  scheduled provider appointments Call provider office for new concerns or questions  Work with the social worker to address care coordination needs and will continue to work with the clinical team to address health care and disease management related needs  Follow Up Plan:  Telephone follow up appointment with care management team member scheduled for:  transition of care nurse on 08/31/2023         Patient reports that he was told that he would get MOMS meals. But no meals have shown up.  Patient reports that he has no food. Reports that his grandson lives with him and that the grandson will get some food later today.  Patient reports that he ate chicken and a hot pocket today.  Reviewed concern for salt intake.   Referral to Child psychotherapist.  Reviewed TOC program with patient and patient has consented.  Follow up appointment scheduled with Scarlette Calico Pleasant for 08/31/2023   Lonia Chimera, RN, BSN, CEN [facility] Coordinator [phone]

## 2023-08-23 NOTE — Patient Outreach (Signed)
Care Management   Visit Note   Name: Lawrence [name] MRN: [id number] DOB: 1955/11/22  Subjective: Lawrence [name] is a 67 y.o. year old male who is a primary care patient of [name], Demetrios Isaacs, MD. The Care Management team was consulted for assistance.      Care Coordination: Engaged with Anne Fu, [employer].    Interventions:      PLAN Will follow up within the next week.

## 2023-08-23 NOTE — Telephone Encounter (Signed)
That fine.

## 2023-08-23 NOTE — Telephone Encounter (Signed)
Home Health Verbal Orders - Caller/Agency: Foni from [facility] Callback Number: [phone] Requesting PT Frequency: 1x6w and nurse evaluation

## 2023-08-24 ENCOUNTER — Other Ambulatory Visit: Payer: Self-pay

## 2023-08-24 ENCOUNTER — Telehealth: Payer: Self-pay | Admitting: Family Medicine

## 2023-08-24 ENCOUNTER — Telehealth: Payer: Self-pay

## 2023-08-24 DIAGNOSIS — E119 Type 2 diabetes mellitus without complications: Secondary | ICD-10-CM | POA: Diagnosis not present

## 2023-08-24 DIAGNOSIS — I110 Hypertensive heart disease with heart failure: Secondary | ICD-10-CM | POA: Diagnosis not present

## 2023-08-24 DIAGNOSIS — J449 Chronic obstructive pulmonary disease, unspecified: Secondary | ICD-10-CM | POA: Diagnosis not present

## 2023-08-24 DIAGNOSIS — K219 Gastro-esophageal reflux disease without esophagitis: Secondary | ICD-10-CM | POA: Diagnosis not present

## 2023-08-24 DIAGNOSIS — I2510 Atherosclerotic heart disease of native coronary artery without angina pectoris: Secondary | ICD-10-CM | POA: Diagnosis not present

## 2023-08-24 DIAGNOSIS — I5032 Chronic diastolic (congestive) heart failure: Secondary | ICD-10-CM | POA: Diagnosis not present

## 2023-08-24 DIAGNOSIS — I480 Paroxysmal atrial fibrillation: Secondary | ICD-10-CM | POA: Diagnosis not present

## 2023-08-24 DIAGNOSIS — I252 Old myocardial infarction: Secondary | ICD-10-CM | POA: Diagnosis not present

## 2023-08-24 DIAGNOSIS — I255 Ischemic cardiomyopathy: Secondary | ICD-10-CM | POA: Diagnosis not present

## 2023-08-24 NOTE — Telephone Encounter (Signed)
Soni with [facility] was advised of okay orders

## 2023-08-24 NOTE — Telephone Encounter (Signed)
Home Health Verbal Orders - Caller/Agency: [facility]  Callback Number: [phone]  Requesting OT/PT/Skilled Nursing/Social Work/Speech Therapy: OT Frequency: 1w5

## 2023-08-25 ENCOUNTER — Telehealth: Payer: Self-pay | Admitting: *Deleted

## 2023-08-25 ENCOUNTER — Other Ambulatory Visit: Payer: Medicare HMO

## 2023-08-25 DIAGNOSIS — I252 Old myocardial infarction: Secondary | ICD-10-CM | POA: Diagnosis not present

## 2023-08-25 DIAGNOSIS — E119 Type 2 diabetes mellitus without complications: Secondary | ICD-10-CM | POA: Diagnosis not present

## 2023-08-25 DIAGNOSIS — I5032 Chronic diastolic (congestive) heart failure: Secondary | ICD-10-CM | POA: Diagnosis not present

## 2023-08-25 DIAGNOSIS — I110 Hypertensive heart disease with heart failure: Secondary | ICD-10-CM | POA: Diagnosis not present

## 2023-08-25 DIAGNOSIS — J449 Chronic obstructive pulmonary disease, unspecified: Secondary | ICD-10-CM | POA: Diagnosis not present

## 2023-08-25 DIAGNOSIS — I2510 Atherosclerotic heart disease of native coronary artery without angina pectoris: Secondary | ICD-10-CM | POA: Diagnosis not present

## 2023-08-25 DIAGNOSIS — I480 Paroxysmal atrial fibrillation: Secondary | ICD-10-CM | POA: Diagnosis not present

## 2023-08-25 DIAGNOSIS — K219 Gastro-esophageal reflux disease without esophagitis: Secondary | ICD-10-CM | POA: Diagnosis not present

## 2023-08-25 DIAGNOSIS — I255 Ischemic cardiomyopathy: Secondary | ICD-10-CM | POA: Diagnosis not present

## 2023-08-25 NOTE — Progress Notes (Signed)
Care Coordination   Note   08/25/2023 Name: Lawrence [name] MRN: [id number] DOB: [birth year]  MARK GAVIA is a 67 y.o. year old male who sees [name], Demetrios Isaacs, MD for primary care. I reached out to [name] by phone today to offer care coordination services.  Mr. Bayles was given information about Care Coordination services today including:   The Care Coordination services include support from the care team which includes your Nurse Coordinator, Clinical Social Worker, or Pharmacist.  The Care Coordination team is here to help remove barriers to the health concerns and goals most important to you. Care Coordination services are voluntary, and the patient may decline or stop services at any time by request to their care team member.   Care Coordination Consent Status: Patient agreed to services and verbal consent obtained.   Follow up plan:  Telephone appointment with care coordination team member scheduled for:  09/02/2023  Encounter Outcome:  Patient Scheduled from referral   Burman Nieves, [facility] Care Coordination Care Guide Direct Dial: [phone]

## 2023-08-25 NOTE — Patient Outreach (Signed)
Care Management   Visit Note   Name: Lawrence [name] MRN: [id number] DOB: [birth year]  Subjective: Lawrence [name] is a 67 y.o. year old male who is a primary care patient of [name], Demetrios Isaacs, MD. The Care Management team was consulted for assistance.      Engaged with patient via telephone.  Assessment:  Outpatient Encounter Medications as of 08/06/2023  Medication Sig   Accu-Chek Softclix Lancets lancets Use as instructed to check sugar daily for type 2 diabetes.   allopurinol (ZYLOPRIM) 300 MG tablet TAKE 1 TABLET BY MOUTH TWICE A DAY (Patient not taking: Reported on 08/23/2023)   ALPRAZolam (XANAX) 1 MG tablet Take 1 tablet (1 mg total) by mouth 3 (three) times daily as needed. (Patient taking differently: Take 1 mg by mouth 3 (three) times daily.)   atorvastatin (LIPITOR) 80 MG tablet TAKE 1 TABLET BY MOUTH AT BEDTIME   Blood Glucose Calibration (ACCU-CHEK GUIDE CONTROL) LIQD Use with blood glucose monitor as directed   Blood Glucose Monitoring Suppl (ACCU-CHEK GUIDE) w/Device KIT Use to check blood sugars as directed   Budeson-Glycopyrrol-Formoterol (BREZTRI AEROSPHERE) 160-9-4.8 MCG/ACT AERO Inhale 2 puffs into the lungs 2 (two) times daily.   cetirizine (ZYRTEC) 10 MG tablet TAKE 1 TABLET BY MOUTH AT BEDTIME (Patient not taking: Reported on 08/23/2023)   clonazePAM (KLONOPIN) 0.5 MG tablet Take 0.5 mg by mouth 2 (two) times daily. (Patient not taking: Reported on 08/23/2023)   dicyclomine (BENTYL) 10 MG capsule Take 1 capsule (10 mg total) by mouth 3 (three) times daily as needed (abdominal pain). (Patient not taking: Reported on 08/23/2023)   FARXIGA 10 MG TABS tablet Take 10 mg by mouth daily.   glipiZIDE (GLUCOTROL) 5 MG tablet Take 1 tablet (5 mg total) by mouth daily as needed (sugar over 300). (Patient not taking: Reported on 08/23/2023)   glucose blood (ACCU-CHEK GUIDE) test strip Use as instructed to check sugar daily for type 2 diabetes.   iron polysaccharides (NIFEREX) 150 MG  capsule Take 1 capsule (150 mg total) by mouth daily.   metFORMIN (GLUCOPHAGE-XR) 500 MG 24 hr tablet Take 1 tablet (500 mg total) by mouth 2 (two) times daily. (Patient taking differently: Take 500 mg by mouth daily.)   methocarbamol (ROBAXIN) 500 MG tablet TAKE 1 TABLET BY MOUTH EVERY 8 HOURS AS NEEDED FOR MUSCLE SPASMS   omega-3 acid ethyl esters (LOVAZA) 1 g capsule TAKE 4 CAPSULES (4 GRAMS TOTAL) BY MOUTHDAILY   pantoprazole (PROTONIX) 40 MG tablet Take 1 tablet (40 mg total) by mouth 2 (two) times daily for 14 days.   ranolazine (RANEXA) 1000 MG SR tablet Take 1 tablet (1,000 mg total) by mouth 2 (two) times daily.   traZODone (DESYREL) 150 MG tablet TAKE 1 TABLET BY MOUTH AT BEDTIME (Patient taking differently: Take 100 mg by mouth at bedtime.)   [DISCONTINUED] albuterol (VENTOLIN HFA) 108 (90 Base) MCG/ACT inhaler INHALE 2 PUFFS BY MOUTH EVERY 6 HOURS AS NEEDED FOR SHORTNESS OF BREATH (Patient not taking: Reported on 08/05/2023)   [DISCONTINUED] amLODipine (NORVASC) 5 MG tablet Take 1 tablet by mouth daily.   [DISCONTINUED] apixaban (ELIQUIS) 5 MG TABS tablet Take 1 tablet (5 mg total) by mouth 2 (two) times daily.   [DISCONTINUED] clopidogrel (PLAVIX) 75 MG tablet Take 1 tablet (75 mg total) by mouth daily.   [DISCONTINUED] isosorbide mononitrate (IMDUR) 120 MG 24 hr tablet Take 1 tablet (120 mg total) by mouth daily.   [DISCONTINUED] lisinopril (ZESTRIL) 20 MG tablet Take 20  mg by mouth daily.   [DISCONTINUED] oxyCODONE-acetaminophen (PERCOCET) 10-325 MG tablet TAKE 1 TABLET BY MOUTH EVERY 4 HOURS AS NEEDED FOR PAIN   [DISCONTINUED] venlafaxine XR (EFFEXOR-XR) 75 MG 24 hr capsule TAKE 1 CAPSULE BY MOUTH DAILY WITH BREAKFAST (Patient not taking: Reported on 08/05/2023)   No facility-administered encounter medications on file as of 08/06/2023.    Interventions:   Goals Addressed             This Visit's Progress    Care Management       Current Barriers:  Care Management support and  education needs related to Multiple Chronic Diseases Requires additional Care Giver support in the home   COPD Interventions:  Reviewed plan for COPD management.  Patient was evaluated for recently hospitalized at [facility] He returned to the [facility] Emergency Room on 9/18 and 9/19 due to chest pain. Reports respiratory symptoms have been controlled. He continues using supplemental oxygen at 2L/min. Denies symptoms at time of call but notes activity level is very limited. He experiences dyspnea with minimal exertion. Discussed plan to engage with [facility] services. Reports receiving a call to schedule nursing and physical therapy visit.  Reports his grandson will be arriving to serve as his caregiver. Reports he has informed the property Barrister's clerk. The care team will collaborate with [facility] to ensure his grandson is able to reside in the apartment until the patient relocates. Reports taking all medications as prescribed. Confirmed that his daughter in law is assisting with medication management and preparing pill box.  Provided information regarding infection prevention and increased risk r/t COPD. Advised to utilize prevention strategies to reduce risk of respiratory infection . Thorough discussion regarding worsening symptoms that require immediate medical attention.    Self-Care Activities/Patient Goals: Take medications and utilize inhalers as prescribed Continue using supplemental oxygen as instructed. Follow recommendations to prevent respiratory infection Follow activity restrictions to prevent overexertion and exacerbation Assess symptoms. Immediately call 911 or return to ED if symptoms return or worsen.  ----------------------------------------------------------------------------------------------------------------  Heart Failure Interventions:   Discussed current plan for CHF management.  Patient was evaluated for recently  hospitalized at [facility]. He returned to the [facility] Emergency Room on 9/18 and 9/19 due to chest pain. Reports he has transitioned heart care to [facility] Cardiology. He was being followed by [facility].  Reports completing extensive work ups r/t cause of non specific chest pain. he is currently wearing a heart monitor/Zio. Anticipates it being removed when he returns to [facility] Cardiology. Reviewed medications. Reports taking as prescribed. Reports his daughter in law is currently assisting with medication management via pillbox preparation.  Discussed symptoms since last Emergency Room visit. He has experienced episodes of chest pain but reports doing well today. Thorough discussion regarding need to call 911 or return to the ER if his symptoms worsen. Reviewed established weight parameters. Reminded of need to notify a provider for weight gain greater than 3 lbs overnight or weight gain greater than 5 lbs within a week. Reports weight today was 184 lbs. Reviewed s/sx of fluid overload and indications for notifying a provider. Activity tolerance remains limited and he experiences dyspnea with minimal exertion. Using supplemental O2 at 2L/min. Denies increased abdominal or lower extremity edema.  Anticipates his grandson arriving to provide caregiver services. Reports significant decline in activity tolerance. Reports his son is currently assisting. Plan is for his grandson to relocate and serve as his caregiver.  Reports receiving call from [facility] to start Home  Health Nursing and Physical therapy.  Thorough discussion regarding worsening s/sx related to CHF exacerbation and indications for seeking immediate medical attention.     Self-Care Activities/Patient Goals: Take all medications as prescribed Keep scheduled appointments with the Cardiology team Monitor weight and record readings Notify provider for weight gain outside of established parameters Limit sodium intake Monitor symptoms and seek  immediate medical attention if symptoms worsen              PLAN:  Will follow up next week.   [facility]  [facility], [facility] RN Care Manager Direct Dial: [phone] Website: [email or web address]

## 2023-08-25 NOTE — Patient Outreach (Signed)
Care Management   Visit Note   Name: Lawrence [name] MRN: [id number] DOB: 02/08/1956  Subjective: Lawrence [name] is a 67 y.o. year old male who is a primary care patient of Lawrence [name]. The Care Management team was consulted for assistance.      Engaged with patient by telephone.  Assessment:  Outpatient Encounter Medications as of 07/22/2023  Medication Sig   Accu-Chek Softclix Lancets lancets Use as instructed to check sugar daily for type 2 diabetes.   allopurinol (ZYLOPRIM) 300 MG tablet TAKE 1 TABLET BY MOUTH TWICE A DAY (Patient not taking: Reported on 08/23/2023)   ALPRAZolam (XANAX) 1 MG tablet Take 1 tablet (1 mg total) by mouth 3 (three) times daily as needed. (Patient taking differently: Take 1 mg by mouth 3 (three) times daily.)   atorvastatin (LIPITOR) 80 MG tablet TAKE 1 TABLET BY MOUTH AT BEDTIME   Blood Glucose Calibration (ACCU-CHEK GUIDE CONTROL) LIQD Use with blood glucose monitor as directed   Blood Glucose Monitoring Suppl (ACCU-CHEK GUIDE) w/Device KIT Use to check blood sugars as directed   Budeson-Glycopyrrol-Formoterol (BREZTRI AEROSPHERE) 160-9-4.8 MCG/ACT AERO Inhale 2 puffs into the lungs 2 (two) times daily.   cetirizine (ZYRTEC) 10 MG tablet TAKE 1 TABLET BY MOUTH AT BEDTIME (Patient not taking: Reported on 08/23/2023)   dicyclomine (BENTYL) 10 MG capsule Take 1 capsule (10 mg total) by mouth 3 (three) times daily as needed (abdominal pain). (Patient not taking: Reported on 08/23/2023)   [EXPIRED] doxycycline (VIBRAMYCIN) 100 MG capsule Take 1 capsule (100 mg total) by mouth 2 (two) times daily for 7 days.   FARXIGA 10 MG TABS tablet Take 10 mg by mouth daily.   glipiZIDE (GLUCOTROL) 5 MG tablet Take 1 tablet (5 mg total) by mouth daily as needed (sugar over 300). (Patient not taking: Reported on 08/23/2023)   glucose blood (ACCU-CHEK GUIDE) test strip Use as instructed to check sugar daily for type 2 diabetes.   iron polysaccharides (NIFEREX) 150 MG  capsule Take 1 capsule (150 mg total) by mouth daily.   metFORMIN (GLUCOPHAGE-XR) 500 MG 24 hr tablet Take 1 tablet (500 mg total) by mouth 2 (two) times daily. (Patient taking differently: Take 500 mg by mouth daily.)   methocarbamol (ROBAXIN) 500 MG tablet TAKE 1 TABLET BY MOUTH EVERY 8 HOURS AS NEEDED FOR MUSCLE SPASMS   omega-3 acid ethyl esters (LOVAZA) 1 g capsule TAKE 4 CAPSULES (4 GRAMS TOTAL) BY MOUTHDAILY   pantoprazole (PROTONIX) 40 MG tablet Take 1 tablet (40 mg total) by mouth 2 (two) times daily for 14 days.   [EXPIRED] predniSONE (DELTASONE) 20 MG tablet Take 3 tablets (60 mg total) by mouth daily with breakfast for 5 days.   traZODone (DESYREL) 150 MG tablet TAKE 1 TABLET BY MOUTH AT BEDTIME (Patient taking differently: Take 100 mg by mouth at bedtime.)   [DISCONTINUED] acetaminophen (TYLENOL) 325 MG tablet Take 2 tablets (650 mg total) by mouth every 4 (four) hours as needed for headache or mild pain.   [DISCONTINUED] albuterol (VENTOLIN HFA) 108 (90 Base) MCG/ACT inhaler INHALE 2 PUFFS BY MOUTH EVERY 6 HOURS AS NEEDED FOR SHORTNESS OF BREATH (Patient not taking: Reported on 08/05/2023)   [DISCONTINUED] amiodarone (PACERONE) 200 MG tablet Take 1 tablet (200 mg total) by mouth daily.   [DISCONTINUED] apixaban (ELIQUIS) 5 MG TABS tablet Take 1 tablet (5 mg total) by mouth 2 (two) times daily.   [DISCONTINUED] celecoxib (CELEBREX) 200 MG capsule Take 1 capsule (200 mg total)  by mouth 2 (two) times daily as needed. Home med.   [DISCONTINUED] clopidogrel (PLAVIX) 75 MG tablet Take 1 tablet (75 mg total) by mouth daily.   [DISCONTINUED] isosorbide mononitrate (IMDUR) 120 MG 24 hr tablet Take 1 tablet (120 mg total) by mouth daily.   [DISCONTINUED] lisinopril (ZESTRIL) 20 MG tablet Take 20 mg by mouth daily.   [DISCONTINUED] meclizine (ANTIVERT) 25 MG tablet TAKE ONE TABLET BY THREE TIMES A DAY AS NEEDED FOR DIZZINESS   [DISCONTINUED] metoprolol succinate (TOPROL-XL) 25 MG 24 hr tablet Take  0.5 tablets (12.5 mg total) by mouth daily.   [DISCONTINUED] nitroGLYCERIN (NITROSTAT) 0.4 MG SL tablet Place 1 tablet (0.4 mg total) under the tongue every 5 (five) minutes as needed for chest pain.   [DISCONTINUED] ondansetron (ZOFRAN) 4 MG tablet Take 1 tablet (4 mg total) by mouth every 8 (eight) hours as needed. (Patient not taking: Reported on 08/05/2023)   [DISCONTINUED] oxyCODONE-acetaminophen (PERCOCET) 10-325 MG tablet TAKE 1 TABLET BY MOUTH EVERY 4 HOURS AS NEEDED FOR PAIN   [DISCONTINUED] ranolazine (RANEXA) 1000 MG SR tablet Take 1 tablet (1,000 mg total) by mouth 2 (two) times daily. (Patient not taking: Reported on 08/05/2023)   [DISCONTINUED] torsemide (DEMADEX) 100 MG tablet Take 0.5 tablets (50 mg total) by mouth daily.   [DISCONTINUED] venlafaxine XR (EFFEXOR-XR) 75 MG 24 hr capsule TAKE 1 CAPSULE BY MOUTH DAILY WITH BREAKFAST (Patient not taking: Reported on 08/05/2023)   No facility-administered encounter medications on file as of 07/22/2023.    Interventions:   Goals Addressed             This Visit's Progress    Care Management       Current Barriers:  Care Management support and education needs related to Multiple Chronic Diseases Requires additional Care Giver support in the home   COPD Interventions:   Reviewed plan for COPD management.  Patient was evaluated for recently admitted and evaluated for chest pain, shortness of breath and nausea.  Reviewed recent discharge instructions. Reports receiving call from [facility] He is agreeable to  home health services. Reviewed medications. Reports taking all medications as prescribed. Reports being very "winded" today with minimal activity. Reports using supplemental oxygen at 2L/min as advised.  Discussed ability to complete tasks in the home. Reports his activity tolerance has significantly declined. Patient currently lives alone but reports his son is available to assist as needed. Plan is for his grandson to relocate  from [facility] to move in and serve as a caregiver. Agreed to keep the care team updated of in home needs. Provided information regarding infection prevention and increased risk r/t COPD. Advised to utilize prevention strategies to reduce risk of respiratory infection . Thorough discussion regarding worsening symptoms that require immediate medical attention. Patient is agreeable to calling 911 or having his son transport him to the nearest Emergency Room if his symptoms worsen. Update 9/5: Patient was evaluated at [facility] Emergency Room for COPD exacerbation. Reports continued symptoms of chest pain, shortness of breath and nausea. Discussed need to return to ER. Patient is agreeable. Will call 911.   Self-Care Activities/Patient Goals: Take medications and utilize inhalers as prescribed Continue using supplemental oxygen as instructed. Follow recommendations to prevent respiratory infection Follow activity restrictions to prevent overexertion and exacerbation Assess symptoms. Immediately call 911 or return to ED if symptoms return or worsen.           PLAN Will follow up tomorrow.   Katina [facility]  [facility], [employer] [facility] RN Care Manager Direct Dial: [phone] Website: [email or web address]

## 2023-08-25 NOTE — Patient Outreach (Signed)
Care Management   Visit Note   Name: Lawrence [name] MRN: [id number] DOB: 05/20/1956  Subjective: Lawrence [name] is a 67 y.o. year old male who is a primary care patient of [name], Lawrence Isaacs, MD. [facility] Care Management team was consulted for assistance.      Collaboration with [facility].   Interventions:   Goals Addressed             This Visit's Progress    Care Management       Current Barriers:  Care Management support and education needs related to Multiple Chronic Diseases Requires additional Care Giver support in [place] home   Heart Failure Interventions:   Patient was evaluated for recently hospitalized at [name]. He returned to [name] on 9/18 and 9/19 due to chest pain. Reports he has transitioned heart care to [facility] Cardiology. He was being followed by [name].  Reports completing extensive work ups r/t cause of non specific chest pain. he is currently wearing a heart monitor/Zio. Anticipates it being removed when he returns to [name]. Reviewed medications. Reports taking as prescribed. Reports his daughter in law is currently assisting with medication management via pillbox preparation.  Discussed symptoms since last Emergency Room visit. He has experienced episodes of chest pain but reports doing well today. Thorough discussion regarding need to call 911 or return to [facility] ER if his symptoms worsen. Reviewed established weight parameters. Reminded of need to notify a provider for weight gain greater than 3 lbs overnight or weight gain greater than 5 lbs within a week. Reports weight today was 184 lbs. Reviewed s/sx of fluid overload and indications for notifying a provider. Activity tolerance remains limited and he experiences dyspnea with minimal exertion. Using supplemental O2 at 2L/min. Denies increased abdominal or lower extremity edema.  Anticipates his grandson arriving to provide caregiver services. Reports significant  decline in activity tolerance. Reports his son is currently assisting. Plan is for his grandson to relocate and serve as his caregiver.  Reports receiving call from [name] to start Home Health Nursing and Physical therapy.  Thorough discussion regarding worsening s/sx related to CHF exacerbation and indications for seeking immediate medical attention. Update 08/13/23: Lawrence [name] was admitted for chest pain since outreach last week. Reports being admitted on 9/21 and returning to [facility] ER 9/24, 9/25, 9/26 and again earlier today. Reports chest pain is only relieved with IV or injectable pain medications. He will continue close follow up with [name] team. Confirmed that his grandson is currently at [place] apartment to assist. Anticipates returning to [name] per Cardiology request.  Reports speaking with [facility]. A message was left for housing manager to contact RNCM regarding needed documentation from [facility] Primary Care Provider. He was unable to attend scheduled PCP visit today. Pending [name] follow up on 08/18/23. Thorough discussion with Lawrence [name] regarding worsening s/sx and indications for calling 911 or returning to [facility] Emergency Room. Update 9/30: Patient reports being readmitted to [name]. Reports pending plan for surgical intervention.  Provided verbal consent to contact son Lawrence [name] if additional information is needed regarding services that will be required in Lawrence home. Patient reports being informed that he would receive delivered meals following [name] discharge. Recall [employer] would be providing Lawrence deliveries. Unsure if representative he spoke with was from his insurance provider. Reports meals were scheduled to be delivered on Saturday 08/14/23 however delivery was not received. Will contact staff at [name] to confirm that he  is on Lawrence roster for meal delivery. Discussed information provided by [employer]. [place] [employer] requires  documentation regarding Lawrence need for an additional resident in [facility] home. Anticipates a waiver being granted for Reasonable Accommodations due to patient's multiple chronic illnesses and need for assistance in [facility] home. Will follow up with PCP to confirm documents are received. Will follow up with [facility] this week. 08/18/23: Collaboration with [employer]. Contact and [facility] email provided for receipt of documents requiring provider signature. Will forward to PCP when documents are received.            PLAN:  Will follow up this week.   [facility]ina [facility]  [facility]rence [name], [facility] RN Care Manager Direct Dial: [phone] Website: [email or web address]

## 2023-08-25 NOTE — Telephone Encounter (Signed)
Verbal order given  

## 2023-08-25 NOTE — Telephone Encounter (Signed)
That's fine

## 2023-08-26 NOTE — Addendum Note (Signed)
Addended by: Pollie Friar on: 08/26/2023 02:09 PM   Modules accepted: Orders

## 2023-08-27 ENCOUNTER — Telehealth: Payer: Self-pay | Admitting: Pharmacist

## 2023-08-27 NOTE — Progress Notes (Signed)
08/27/2023  Patient ID: Lawrence [name], male   DOB: [birth year], 66 y.o.   MRN: [id number]  Tried calling new Cardiologist's office yesterday for Dr. Nedra Hai as patient has continued the Plavix as it was not listed on the discharge summary and discontinued the aspirin. They responded in the morning saying they could not provide advice as they have not established with the patient yet.  [facility]iology who performed heart catheterization on 08/17/23 for advice regarding as to what the patient is to be taking during this time. Cardiology doctor informed that he did not receive any stents recently, so as long as there is no new bleeding concerns, the patient can continue both the Plavix and Eliquis together until 09/15/23 visit. Continue to hold aspirin.  Called Tobi Bastos and confirmed that this is what was placed in the patient's pillboxes. Aware of how to proceed and verified upcoming appt dates and times.    Marlowe Aschoff, PharmD [facility] Phone Number: [phone]

## 2023-08-27 NOTE — Patient Outreach (Signed)
Care Management   Visit Note   Name: Lawrence [name] MRN: [id number] DOB: [birth year]  Subjective: Lawrence [name] is a 67 y.o. year old male who is a primary care patient of [name], Demetrios Isaacs, MD. The Care Management team was consulted for assistance.      Engaged with patient via telephone.  Assessment:  Outpatient Encounter Medications as of 08/24/2023  Medication Sig Note   Accu-Chek Softclix Lancets lancets Use as instructed to check sugar daily for type 2 diabetes.    allopurinol (ZYLOPRIM) 300 MG tablet TAKE 1 TABLET BY MOUTH TWICE A DAY    ALPRAZolam (XANAX) 1 MG tablet Take 1 tablet (1 mg total) by mouth 3 (three) times daily as needed. (Patient taking differently: Take 1 mg by mouth 3 (three) times daily.)    amLODipine (NORVASC) 5 MG tablet Take 5 mg by mouth daily.    atorvastatin (LIPITOR) 80 MG tablet TAKE 1 TABLET BY MOUTH AT BEDTIME    Blood Glucose Calibration (ACCU-CHEK GUIDE CONTROL) LIQD Use with blood glucose monitor as directed    Blood Glucose Monitoring Suppl (ACCU-CHEK GUIDE) w/Device KIT Use to check blood sugars as directed    Budeson-Glycopyrrol-Formoterol (BREZTRI AEROSPHERE) 160-9-4.8 MCG/ACT AERO Inhale 2 puffs into the lungs 2 (two) times daily.    bumetanide (BUMEX) 2 MG tablet Take 2 mg by mouth daily. As needed for swelling for 7 days    cetirizine (ZYRTEC) 10 MG tablet TAKE 1 TABLET BY MOUTH AT BEDTIME    clonazePAM (KLONOPIN) 0.5 MG tablet Take 0.5 mg by mouth 2 (two) times daily. (Patient not taking: Reported on 08/23/2023)    clopidogrel (PLAVIX) 75 MG tablet TAKE 1 TABLET BY MOUTH DAILY    dicyclomine (BENTYL) 10 MG capsule Take 1 capsule (10 mg total) by mouth 3 (three) times daily as needed (abdominal pain). (Patient not taking: Reported on 08/23/2023)    ELIQUIS 5 MG TABS tablet TAKE 1 TABLET BY MOUTH TWICE A DAY    FARXIGA 10 MG TABS tablet Take 10 mg by mouth daily.    glipiZIDE (GLUCOTROL) 5 MG tablet Take 1 tablet (5 mg total) by mouth daily as  needed (sugar over 300). (Patient not taking: Reported on 08/23/2023)    glucose blood (ACCU-CHEK GUIDE) test strip Use as instructed to check sugar daily for type 2 diabetes.    iron polysaccharides (NIFEREX) 150 MG capsule Take 1 capsule (150 mg total) by mouth daily.    isosorbide mononitrate (IMDUR) 120 MG 24 hr tablet TAKE 1 TABLET BY MOUTH DAILY    lidocaine (LIDODERM) 5 % Place 1 patch onto the skin daily. Remove & Discard patch within 12 hours or as directed by MD    metFORMIN (GLUCOPHAGE-XR) 500 MG 24 hr tablet Take 1 tablet (500 mg total) by mouth 2 (two) times daily. (Patient taking differently: Take 500 mg by mouth daily.)    methocarbamol (ROBAXIN) 500 MG tablet TAKE 1 TABLET BY MOUTH EVERY 8 HOURS AS NEEDED FOR MUSCLE SPASMS 08/26/2023: Up to 4x a day   omega-3 acid ethyl esters (LOVAZA) 1 g capsule TAKE 4 CAPSULES (4 GRAMS TOTAL) BY MOUTHDAILY    oxyCODONE-acetaminophen (PERCOCET) 10-325 MG tablet TAKE 1 TABLET BY MOUTH EVERY 4 HOURS AS NEEDED FOR PAIN    pantoprazole (PROTONIX) 40 MG tablet Take 1 tablet (40 mg total) by mouth 2 (two) times daily for 14 days.    ranolazine (RANEXA) 1000 MG SR tablet Take 1 tablet (1,000 mg total) by mouth 2 (  two) times daily.    sacubitril-valsartan (ENTRESTO) 24-26 MG Take 1 tablet by mouth 2 (two) times daily. (Patient not taking: Reported on 08/26/2023)    spironolactone (ALDACTONE) 25 MG tablet Take 0.5 tablets (12.5 mg total) by mouth daily. (Patient taking differently: Take 25 mg by mouth daily.)    traZODone (DESYREL) 150 MG tablet TAKE 1 TABLET BY MOUTH AT BEDTIME (Patient taking differently: Take 100 mg by mouth at bedtime.)    No facility-administered encounter medications on file as of 08/24/2023.    Interventions:   Goals Addressed             This Visit's Progress    Care Management       Current Barriers:  Care Management support and education needs related to Multiple Chronic Diseases Requires additional Care Giver support in  the home  Planned Interventions: Patient was recently hospitalized at [facility]. Heart Catheterization was performed.  Reviewed medications and symptoms since discharge. Reports doing well today. Confirmed grandson [name] is available to assist in the home. Reviewed Cardiologist instructions to take Bumex for 7 days and start spironolactone 25 mg daily. Reports taking as prescribed. Reports BP is being monitored by [facility] staff and readings have been within range. Denies chest pain, palpitations, dizziness or headaches since discharge. Reviewed weight parameters. Reports weighing as advised. Reports weight today was 182 lbs. Verbalized need to notify the Cardiology team for weight gain greater than 2 lbs overnight or 5 lbs in a week. Reports minimal swelling to lower extremities. He notes this is an significant improvement. Continues to experience shortness of breath and fatigue with minimal exertion but feels that he is at his baseline. Reports not requiring supplemental oxygen during the day since hospital discharge. Currently using 2L/min at night. Reviewed nutritional needs. Reports pending meal delivery. Reports he initially thought the meals would be delivered by [facility] however meals will be delivered from a different agency. Reports receiving a home visit today from the local agency and being informed that the meals were delayed. Unable to recall the name of the agency. Anticipates start of meal delivery within the next two weeks. Reports grandson [name] is currently available to assist with meal preparation if needed. Thorough discussion regarding worsening s/sx  and indications for seeking immediate medical attention.   Self-Care Activities/Patient Goals: Take all medications as prescribed Keep scheduled appointments with the Dr. Lee/Cardiology on 09/15/23 Keep appointment with Dr. Sherrie Mustache on 09/03/23. Monitor weight and record readings Notify provider for weight gain outside of  established parameters Limit sodium intake Follow recommended safety and fall prevention measures Monitor symptoms and seek immediate medical attention if symptoms worsen          PLAN  Will follow up this week.   [facility]  [facility], [facility] RN Care Manager Direct Dial: [phone] Website: [email or web address]

## 2023-08-27 NOTE — Patient Outreach (Addendum)
Care Management   Visit Note   Name: Lawrence [name] MRN: [id number] DOB: 1956/10/03  Subjective: Lawrence [name] is a 67 y.o. year old male who is a primary care patient of [name], Demetrios Isaacs, MD. The Care Management team was consulted for assistance.      Engaged with patient via telephone.  Assessment:  Outpatient Encounter Medications as of 08/25/2023  Medication Sig Note   Accu-Chek Softclix Lancets lancets Use as instructed to check sugar daily for type 2 diabetes.    allopurinol (ZYLOPRIM) 300 MG tablet TAKE 1 TABLET BY MOUTH TWICE A DAY    ALPRAZolam (XANAX) 1 MG tablet Take 1 tablet (1 mg total) by mouth 3 (three) times daily as needed. (Patient taking differently: Take 1 mg by mouth 3 (three) times daily.)    amLODipine (NORVASC) 5 MG tablet Take 5 mg by mouth daily.    atorvastatin (LIPITOR) 80 MG tablet TAKE 1 TABLET BY MOUTH AT BEDTIME    Blood Glucose Calibration (ACCU-CHEK GUIDE CONTROL) LIQD Use with blood glucose monitor as directed    Blood Glucose Monitoring Suppl (ACCU-CHEK GUIDE) w/Device KIT Use to check blood sugars as directed    Budeson-Glycopyrrol-Formoterol (BREZTRI AEROSPHERE) 160-9-4.8 MCG/ACT AERO Inhale 2 puffs into the lungs 2 (two) times daily.    bumetanide (BUMEX) 2 MG tablet Take 2 mg by mouth daily. As needed for swelling for 7 days    cetirizine (ZYRTEC) 10 MG tablet TAKE 1 TABLET BY MOUTH AT BEDTIME    clonazePAM (KLONOPIN) 0.5 MG tablet Take 0.5 mg by mouth 2 (two) times daily. (Patient not taking: Reported on 08/23/2023)    clopidogrel (PLAVIX) 75 MG tablet TAKE 1 TABLET BY MOUTH DAILY    Cyanocobalamin (VITAMIN B-12) 1000 MCG SUBL Place 1 tablet under the tongue daily.    dicyclomine (BENTYL) 10 MG capsule Take 1 capsule (10 mg total) by mouth 3 (three) times daily as needed (abdominal pain). (Patient not taking: Reported on 08/23/2023)    ELIQUIS 5 MG TABS tablet TAKE 1 TABLET BY MOUTH TWICE A DAY    FARXIGA 10 MG TABS tablet Take 10 mg by mouth  daily.    glipiZIDE (GLUCOTROL) 5 MG tablet Take 1 tablet (5 mg total) by mouth daily as needed (sugar over 300). (Patient not taking: Reported on 08/23/2023)    glucose blood (ACCU-CHEK GUIDE) test strip Use as instructed to check sugar daily for type 2 diabetes.    iron polysaccharides (NIFEREX) 150 MG capsule Take 1 capsule (150 mg total) by mouth daily.    isosorbide mononitrate (IMDUR) 120 MG 24 hr tablet TAKE 1 TABLET BY MOUTH DAILY    lidocaine (LIDODERM) 5 % Place 1 patch onto the skin daily. Remove & Discard patch within 12 hours or as directed by MD    lisinopril (ZESTRIL) 20 MG tablet Take 20 mg by mouth daily.    metFORMIN (GLUCOPHAGE-XR) 500 MG 24 hr tablet Take 1 tablet (500 mg total) by mouth 2 (two) times daily. (Patient taking differently: Take 500 mg by mouth daily.)    methocarbamol (ROBAXIN) 500 MG tablet TAKE 1 TABLET BY MOUTH EVERY 8 HOURS AS NEEDED FOR MUSCLE SPASMS 08/26/2023: Up to 4x a day   metoprolol succinate (TOPROL-XL) 25 MG 24 hr tablet TAKE 1/2 TABLET (12.5 MG TOTAL) BY MOUTHDAILY    omega-3 acid ethyl esters (LOVAZA) 1 g capsule TAKE 4 CAPSULES (4 GRAMS TOTAL) BY MOUTHDAILY    oxyCODONE-acetaminophen (PERCOCET) 10-325 MG tablet TAKE 1 TABLET BY  MOUTH EVERY 4 HOURS AS NEEDED FOR PAIN    OXYGEN Inhale 2 L into the lungs at bedtime as needed (for shortness of breath).    ranolazine (RANEXA) 1000 MG SR tablet Take 1 tablet (1,000 mg total) by mouth 2 (two) times daily.    sacubitril-valsartan (ENTRESTO) 24-26 MG Take 1 tablet by mouth 2 (two) times daily. (Patient not taking: Reported on 08/26/2023)    spironolactone (ALDACTONE) 25 MG tablet Take 0.5 tablets (12.5 mg total) by mouth daily. (Patient taking differently: Take 25 mg by mouth daily.)    traZODone (DESYREL) 150 MG tablet TAKE 1 TABLET BY MOUTH AT BEDTIME (Patient taking differently: Take 100 mg by mouth at bedtime.)    venlafaxine XR (EFFEXOR-XR) 75 MG 24 hr capsule Take 75 mg by mouth daily with breakfast.     No facility-administered encounter medications on file as of 08/25/2023.    Interventions:    Goals Addressed             This Visit's Progress    Care Management       Current Barriers:  Care Management support and education needs related to Multiple Chronic Diseases Requires additional Care Giver support in the home  Planned Interventions: Patient was recently hospitalized at [facility]. Heart Catheterization was performed.  Reviewed medications and symptoms since discharge. Reports doing well today. Confirmed grandson [name] is available to assist in the home. Reviewed Cardiologist instructions to take Bumex for 7 days and start spironolactone 25 mg daily. Reports taking as prescribed. Reports BP is being monitored by [facility] staff and readings have been within range. Denies chest pain, palpitations, dizziness or headaches since discharge. Reviewed weight parameters. Reports weighing as advised. Reports weight today was 182 lbs. Verbalized need to notify the Cardiology team for weight gain greater than 2 lbs overnight or 5 lbs in a week. Reports minimal swelling to lower extremities. He notes this is an significant improvement. Continues to experience shortness of breath and fatigue with minimal exertion but feels that he is at his baseline. Reports not requiring supplemental oxygen during the day since hospital discharge. Currently using 2L/min at night. Reviewed nutritional needs. Reports pending meal delivery. Reports he initially thought the meals would be delivered by [facility] however meals will be delivered from a different agency. Reports receiving a home visit today from the local agency and being informed that the meals were delayed. Unable to recall the name of the agency. Anticipates start of meal delivery within the next two weeks. Reports grandson [name] is currently available to assist with meal preparation if needed. Thorough discussion regarding worsening s/sx  and  indications for seeking immediate medical attention. Update 08/25/23 Provided update regarding discussion with [employer]. Staff aware that Mr. Bucholz will require in-home assistance. His grandson Kaaden Bencosme will be staying at the residence to assist. Franklyn Lor will resend the form for [employer]. Will forward to PCP for review and signature once form is received.  Thorough discussion regarding worsening s/sx related to CHF exacerbation and indications for seeking immediate medical attention.   Self-Care Activities/Patient Goals: Take all medications as prescribed Keep scheduled appointments with the Dr. Lee/Cardiology on 09/15/23 Keep appointment with Dr. Sherrie Mustache on 09/03/23. Monitor weight and record readings Notify provider for weight gain outside of established parameters Limit sodium intake Follow recommended safety and fall prevention measures Monitor symptoms and seek immediate medical attention if symptoms worsen            PLAN  Will follow up within the next week.   Katina [facility]  [facility], [facility] RN Care Manager Direct Dial: [phone] Website: [email or web address]

## 2023-08-30 DIAGNOSIS — J449 Chronic obstructive pulmonary disease, unspecified: Secondary | ICD-10-CM | POA: Diagnosis not present

## 2023-08-30 DIAGNOSIS — K219 Gastro-esophageal reflux disease without esophagitis: Secondary | ICD-10-CM | POA: Diagnosis not present

## 2023-08-30 DIAGNOSIS — I5032 Chronic diastolic (congestive) heart failure: Secondary | ICD-10-CM | POA: Diagnosis not present

## 2023-08-30 DIAGNOSIS — I2510 Atherosclerotic heart disease of native coronary artery without angina pectoris: Secondary | ICD-10-CM | POA: Diagnosis not present

## 2023-08-30 DIAGNOSIS — I110 Hypertensive heart disease with heart failure: Secondary | ICD-10-CM | POA: Diagnosis not present

## 2023-08-30 DIAGNOSIS — I480 Paroxysmal atrial fibrillation: Secondary | ICD-10-CM | POA: Diagnosis not present

## 2023-08-30 DIAGNOSIS — I252 Old myocardial infarction: Secondary | ICD-10-CM | POA: Diagnosis not present

## 2023-08-30 DIAGNOSIS — E119 Type 2 diabetes mellitus without complications: Secondary | ICD-10-CM | POA: Diagnosis not present

## 2023-08-30 DIAGNOSIS — I255 Ischemic cardiomyopathy: Secondary | ICD-10-CM | POA: Diagnosis not present

## 2023-08-31 ENCOUNTER — Telehealth: Payer: Self-pay | Admitting: *Deleted

## 2023-08-31 ENCOUNTER — Ambulatory Visit: Payer: Self-pay | Admitting: *Deleted

## 2023-08-31 DIAGNOSIS — I255 Ischemic cardiomyopathy: Secondary | ICD-10-CM | POA: Diagnosis not present

## 2023-08-31 DIAGNOSIS — J449 Chronic obstructive pulmonary disease, unspecified: Secondary | ICD-10-CM | POA: Diagnosis not present

## 2023-08-31 DIAGNOSIS — E119 Type 2 diabetes mellitus without complications: Secondary | ICD-10-CM | POA: Diagnosis not present

## 2023-08-31 DIAGNOSIS — I480 Paroxysmal atrial fibrillation: Secondary | ICD-10-CM | POA: Diagnosis not present

## 2023-08-31 DIAGNOSIS — I5032 Chronic diastolic (congestive) heart failure: Secondary | ICD-10-CM | POA: Diagnosis not present

## 2023-08-31 DIAGNOSIS — I252 Old myocardial infarction: Secondary | ICD-10-CM | POA: Diagnosis not present

## 2023-08-31 DIAGNOSIS — K219 Gastro-esophageal reflux disease without esophagitis: Secondary | ICD-10-CM | POA: Diagnosis not present

## 2023-08-31 DIAGNOSIS — I2510 Atherosclerotic heart disease of native coronary artery without angina pectoris: Secondary | ICD-10-CM | POA: Diagnosis not present

## 2023-08-31 DIAGNOSIS — I110 Hypertensive heart disease with heart failure: Secondary | ICD-10-CM | POA: Diagnosis not present

## 2023-08-31 NOTE — Telephone Encounter (Signed)
Telephone encounter was:  Successful.  08/31/2023 Name: [name] MRN: [id number] DOB: Oct 11, 1956  Lawrence [name] is a 67 y.o. year old male who is a primary care patient of Sherrie Mustache, Demetrios Isaacs, MD . The community resource team was consulted for assistance with meals [facility] with patient also submitted a meals on wheels for hospital dc plan called [employer] for food plan to be delivered on 09/01/2023 by 10 pm   Care guide performed the following interventions: Follow up call placed to the patient to discuss status of referral.  Follow Up Plan:  No further follow up planned at this time. The patient has been provided with needed resources. Dione Booze [facility]  Population Health Careguide  Direct Dial: [phone] Website: [email or web address]

## 2023-08-31 NOTE — Patient Outreach (Signed)
Care Management  Transitions of Care Program Transitions of Care Post-discharge week 2   08/31/2023 Name: Lawrence [name] MRN: [id number] DOB: June 12, 1956  Subjective: Lawrence [name] is a 67 y.o. year old male who is a primary care patient of [name], Demetrios Isaacs, MD. The Care Management team Engaged with patient Engaged with patient by telephone to assess and address transitions of care needs.   Consent to Services:  Patient was given information about care management services, agreed to services, and gave verbal consent to participate.   Assessment:     Patient had not received the food from [facility]. [facility] with patient also submitted a meals on wheels for hospital dc plan called [employer] for food plan to be delivered on 09/01/2023 by 10 p     SDOH Interventions    Flowsheet Row Telephone from 08/23/2023 in [facility] Care Coordination Patient Outreach from 08/13/2023 in [place] POPULATION HEALTH DEPARTMENT Patient Outreach from 08/06/2023 in [place] POPULATION HEALTH DEPARTMENT Telephone from 07/16/2023 in [facility] Community Care Coordination Telephone from 05/19/2023 in [facility] [facility] Care Coordination Telephone from 05/05/2023 in [facility] Care Coordination  SDOH Interventions        Food Insecurity Interventions Other (Comment)  Research officer, trade union to Child psychotherapist to inquire about [employer] meals previously set up.] -- Intervention Not Indicated Intervention Not Indicated Intervention Not Indicated Intervention Not Indicated  Housing Interventions -- Intervention Not Indicated Intervention Not Indicated -- Intervention Not Indicated Intervention Not Indicated  Transportation Interventions -- -- Intervention Not Indicated Intervention Not Indicated  [reports uses medicaid transportation for provider appointments] Intervention Not Indicated Intervention Not Indicated, Patient Resources  (Friends/Family)  Utilities Interventions -- -- Intervention Not Indicated -- -- --  Alcohol Usage Interventions -- -- Intervention Not Indicated (Score <7) -- -- --  Social Connections Interventions -- -- Intervention Not Indicated -- -- --  Health Literacy Interventions -- -- Intervention Not Indicated -- -- --        Goals Addressed               This Visit's Progress     Patient will report no readmissions to the hospital in the next 30 days. (pt-stated)        Current Barriers:  Diet/Nutrition/Food Resources patient reports no food.  [employer] meals have not shown up.  Provider appointments   Understanding of low salt diet  RNCM Clinical Goal(s):  Patient will work with the Care Management team over the next 30 days to address Transition of Care Barriers: Medication Management Diet/Nutrition/Food Resources Home Health services take all medications exactly as prescribed and will call provider for medication related questions as evidenced by patient report.   attend all scheduled medical appointments: PCP and specialist as evidenced by patient report and review of EMR. work with [employer] care guide to address needs related to  Limited access to food as evidenced by patient and/or community resource care guide support through collaboration with Medical illustrator, provider, and care team.   Interventions: Evaluation of current treatment plan related to  self management and patient's adherence to plan as established by provider Reviewed when to call the MD Reviewed importance of taking all medications as prescribed. Reviewed chest pain and when to call 911. Placed order for social work regarding food insecurity. Encouraged patient to follow his low salt diet.   Transitions of Care:  New goal. Doctor Visits  - discussed the importance of  doctor visits Communication with social worker re: referral placed as patient reports [employer] meals has not shown up and "he has no  food"    Patient Goals/Self-Care Activities: Participate in Transition of Care Program/Attend TOC scheduled calls Notify RN Care Manager of TOC call rescheduling needs Take all medications as prescribed Attend all scheduled provider appointments Call provider office for new concerns or questions  Work with the social worker to address care coordination needs and will continue to work with the clinical team to address health care and disease management related needs  Follow Up Plan:    Transition of Care nurse will follow up on [id number] 1 pm to make sure patient received food.         Plan: Telephone follow up appointment with care management team member scheduled for: [id number] 1 PM  Gean Maidens BSN RN [facility] Care Management [phone]

## 2023-09-01 ENCOUNTER — Encounter: Payer: Self-pay | Admitting: *Deleted

## 2023-09-01 ENCOUNTER — Other Ambulatory Visit: Payer: Self-pay

## 2023-09-01 DIAGNOSIS — J449 Chronic obstructive pulmonary disease, unspecified: Secondary | ICD-10-CM | POA: Diagnosis not present

## 2023-09-01 DIAGNOSIS — E119 Type 2 diabetes mellitus without complications: Secondary | ICD-10-CM | POA: Diagnosis not present

## 2023-09-01 DIAGNOSIS — I480 Paroxysmal atrial fibrillation: Secondary | ICD-10-CM | POA: Diagnosis not present

## 2023-09-01 DIAGNOSIS — I252 Old myocardial infarction: Secondary | ICD-10-CM | POA: Diagnosis not present

## 2023-09-01 DIAGNOSIS — I255 Ischemic cardiomyopathy: Secondary | ICD-10-CM | POA: Diagnosis not present

## 2023-09-01 DIAGNOSIS — K219 Gastro-esophageal reflux disease without esophagitis: Secondary | ICD-10-CM | POA: Diagnosis not present

## 2023-09-01 DIAGNOSIS — I5032 Chronic diastolic (congestive) heart failure: Secondary | ICD-10-CM | POA: Diagnosis not present

## 2023-09-01 DIAGNOSIS — I110 Hypertensive heart disease with heart failure: Secondary | ICD-10-CM | POA: Diagnosis not present

## 2023-09-01 DIAGNOSIS — I2510 Atherosclerotic heart disease of native coronary artery without angina pectoris: Secondary | ICD-10-CM | POA: Diagnosis not present

## 2023-09-01 NOTE — Patient Outreach (Signed)
Care Coordination   Multidisciplinary Case Review Note    09/01/2023 Name: MUSTAPHA SALADIN MRN: [id number] DOB: [birth year]  DONTREL SPRAKER is a 67 y.o. year old male who sees [name], Demetrios Isaacs, MD for primary care.  The  multidisciplinary care team met today to review patient care needs and barriers.     Goals Addressed               This Visit's Progress     Patient will report no readmissions to the hospital in the next 30 days. (pt-stated)   On track     Current Barriers:  Diet/Nutrition/Food Resources patient reports no food.  [employer] meals have not shown up.  Provider appointments   Understanding of low salt diet  RNCM Clinical Goal(s):  Patient will work with the Care Management team over the next 30 days to address Transition of Care Barriers: Medication Management Diet/Nutrition/Food Resources Home Health services take all medications exactly as prescribed and will call provider for medication related questions as evidenced by patient report.   attend all scheduled medical appointments: PCP and specialist as evidenced by patient report and review of EMR. work with [employer] care guide to address needs related to  Limited access to food as evidenced by patient and/or community resource care guide support through collaboration with Medical illustrator, provider, and care team.   Interventions: Evaluation of current treatment plan related to  self management and patient's adherence to plan as established by provider Reviewed when to call the MD Reviewed importance of taking all medications as prescribed. Reviewed chest pain and when to call 911. Placed order for social work regarding food insecurity. Encouraged patient to follow his low salt diet.   Transitions of Care:  New goal. Doctor Visits  - discussed the importance of doctor visits Communication with social worker re: referral placed as patient reports [employer] meals has not shown up and "he has no  food"    Patient Goals/Self-Care Activities: Participate in Transition of Care Program/Attend TOC scheduled calls Notify RN Care Manager of TOC call rescheduling needs Take all medications as prescribed Attend all scheduled provider appointments Call provider office for new concerns or questions  Work with the social worker to address care coordination needs and will continue to work with the clinical team to address health care and disease management related needs  Follow Up Plan:    Transition of Care nurse will follow up on [id number] 1 pm to make sure patient received food.           SDOH assessments and interventions completed:  Yes     Care Coordination Interventions Activated:  Yes   Care Coordination Interventions:  Yes, provided   Follow up plan: Follow up call scheduled for 10/172024 Social worker and [phone] [facility] nurse .  Multidisciplinary Team Attendees:   [facility] Mick Sell Clemons Davina Green  Chrystal Land  Interventions Today    Flowsheet Row Most Recent Value  General Interventions   General Interventions Discussed/Reviewed General Interventions Discussed, General Interventions Reviewed, Referral to Nurse, Communication with  [RN referred to Care Coordinator and will reassess patient and  disease management]  Nutrition Interventions   Nutrition Discussed/Reviewed Nutrition Discussed, Nutrition Reviewed  [Patient nutritional needs was reassessed and patient will receive 1st meal plan [id number].]        Scribe for Multidisciplinary Case Review:  Gean Maidens

## 2023-09-02 ENCOUNTER — Ambulatory Visit: Payer: Self-pay

## 2023-09-02 ENCOUNTER — Telehealth: Payer: Self-pay

## 2023-09-02 NOTE — Patient Outreach (Signed)
Care Coordination   Follow Up Visit Note   09/02/2023 Name: Lawrence [name] MRN: [id number] DOB: 01/28/1956  Lawrence [name] is a 67 y.o. year old male who sees [name], Demetrios Isaacs, MD for primary care. I spoke with  Lawrence [name] by phone today.  What matters to the patients health and wellness today?  Patient needs food assistance.    Goals Addressed             This Visit's Progress    Care Coordination Activities       Interventions Today    Flowsheet Row Most Recent Value  General Interventions   General Interventions Discussed/Reviewed General Interventions Discussed, General Interventions Reviewed  [Pt reports he received food but only 7 day supply.  SW will follow up next week to confirm the second week supply was delivered.]                SDOH assessments and interventions completed:  No     Care Coordination Interventions:  Yes, provided   Follow up plan: Follow up call scheduled for 09/09/23 at 1pm.    Encounter Outcome:  Patient Visit Completed

## 2023-09-02 NOTE — Telephone Encounter (Signed)
Copied from CRM [id number]. Topic: General - Other >> Sep 02, 2023 12:41 PM Epimenio Foot F wrote: Reason for CRM: Pt is calling in requesting to speak with [facility]. Pt says it's regarding his grandson being able to stay with him as his caregiver. Please follow up with pt.

## 2023-09-02 NOTE — Patient Instructions (Signed)
Visit Information  Thank you for taking time to visit with me today. Please don't hesitate to contact me if I can be of assistance to you.   Following are the goals we discussed today:   SW will follow up on 2nd week food delivery.   Our next appointment is by telephone on 09/09/23 at 1pm  Please call the care guide team at [phone] if you need to cancel or reschedule your appointment.   If you are experiencing a Mental Health or Behavioral Health Crisis or need someone to talk to, please call 911  Patient verbalizes understanding of instructions and care plan provided today and agrees to view in MyChart. Active MyChart status and patient understanding of how to access instructions and care plan via MyChart confirmed with patient.     Telephone follow up appointment with care management team member scheduled for: 09/09/23 at 1pm.   Lysle Morales, BSW Social Worker [phone]

## 2023-09-03 ENCOUNTER — Ambulatory Visit (INDEPENDENT_AMBULATORY_CARE_PROVIDER_SITE_OTHER): Payer: Medicare HMO | Admitting: Family Medicine

## 2023-09-03 ENCOUNTER — Encounter: Payer: Self-pay | Admitting: Family Medicine

## 2023-09-03 ENCOUNTER — Other Ambulatory Visit: Payer: Self-pay

## 2023-09-03 ENCOUNTER — Other Ambulatory Visit (INDEPENDENT_AMBULATORY_CARE_PROVIDER_SITE_OTHER): Payer: Self-pay

## 2023-09-03 VITALS — BP 107/62 | HR 51 | Temp 97.7°F | Ht 67.0 in | Wt 195.9 lb

## 2023-09-03 DIAGNOSIS — I25112 Atherosclerotic heart disease of native coronary artery with refractory angina pectoris: Secondary | ICD-10-CM | POA: Diagnosis not present

## 2023-09-03 DIAGNOSIS — E119 Type 2 diabetes mellitus without complications: Secondary | ICD-10-CM | POA: Diagnosis not present

## 2023-09-03 DIAGNOSIS — R079 Chest pain, unspecified: Secondary | ICD-10-CM | POA: Diagnosis not present

## 2023-09-03 DIAGNOSIS — E782 Mixed hyperlipidemia: Secondary | ICD-10-CM | POA: Diagnosis not present

## 2023-09-03 DIAGNOSIS — I700 Atherosclerosis of aorta: Secondary | ICD-10-CM

## 2023-09-03 DIAGNOSIS — I10 Essential (primary) hypertension: Secondary | ICD-10-CM

## 2023-09-03 DIAGNOSIS — I5043 Acute on chronic combined systolic (congestive) and diastolic (congestive) heart failure: Secondary | ICD-10-CM

## 2023-09-03 MED ORDER — NITROGLYCERIN 0.4 MG SL SUBL
0.4000 mg | SUBLINGUAL_TABLET | SUBLINGUAL | 0 refills | Status: DC | PRN
Start: 2023-09-03 — End: 2023-10-21

## 2023-09-03 NOTE — Patient Outreach (Signed)
Care Management   Visit Note  09/03/2023 Name: Lawrence [name] MRN: [id number] DOB: September 23, 1956  Subjective: Lawrence [name] is a 67 y.o. year old male who is a primary care patient of [name], Lawrence Isaacs, MD. The Care Management team was consulted for assistance.      Engaged with patient via telephone.    Goals Addressed             This Visit's Progress    Care Management       Current Barriers:  Care Management support and education needs related to Multiple Chronic Diseases Requires additional Care Giver support in the home  Planned Interventions: Patient was recently hospitalized at [facility]. Heart Catheterization was performed.  Reviewed medications and symptoms since discharge. Reports doing well today. Confirmed grandson Lawrence [name] is available to assist in the home. Reviewed Cardiologist instructions to take Bumex for 7 days and start spironolactone 25 mg daily. Reports taking as prescribed. Reports BP is being monitored by [facility] staff and readings have been within range. Denies chest pain, palpitations, dizziness or headaches since discharge. Reviewed weight parameters. Reports weighing as advised. Reports weight today was 182 lbs. Verbalized need to notify the Cardiology team for weight gain greater than 2 lbs overnight or 5 lbs in a week. Reports minimal swelling to lower extremities. He notes this is an significant improvement. Continues to experience shortness of breath and fatigue with minimal exertion but feels that he is at his baseline. Reports not requiring supplemental oxygen during the day since hospital discharge. Currently using 2L/min at night. Reviewed nutritional needs. Reports pending meal delivery. Reports he initially thought the meals would be delivered by [facility] however meals will be delivered from a different agency. Reports receiving a home visit today from the local agency and being informed that the meals were delayed. Unable to recall the name  of the agency. Anticipates start of meal delivery within the next two weeks. Reports grandson Lawrence [name] is currently available to assist with meal preparation if needed. Thorough discussion regarding worsening s/sx  and indications for seeking immediate medical attention. Update 08/25/23 Provided update regarding discussion with [employer]. Staff aware that Lawrence [name] will require in-home assistance. His grandson Lawrence [name] will be staying at the residence to assist. Lawrence [name] will resend the form for [employer]. Will forward to PCP for review and signature once form is received.  Thorough discussion regarding worsening s/sx related to CHF exacerbation and indications for seeking immediate medical attention. Updated 09/03/23: Lawrence [name] confirmed receiving second delivery of meals on 09/02/23. [employer] forms and letter forwarded. PCP to review and sign during the clinic visit today. Lawrence [name] agreed to hand delivery forms to [facility] following his clinic visit.    Self-Care Activities/Patient Goals: Take all medications as prescribed Keep scheduled appointments with the Lawrence [name]/Cardiology on 09/15/23 Keep appointment with Lawrence [name] on 09/03/23. Monitor weight and record readings Notify provider for weight gain outside of established parameters Limit sodium intake Follow recommended safety and fall prevention measures Monitor symptoms and seek immediate medical attention if symptoms worsen          PLAN:  Will follow up on 09/06/23.    Katina Degree [facility]  [facility], [facility] RN Care Manager Direct Dial: [phone] Website: [email or web address]

## 2023-09-03 NOTE — Patient Instructions (Addendum)
Thank you for allowing the Care Management team to participate in your care. It was great speaking with you today!   The [employer] Accommod[employer] forms along with the letter from Dr. Sherrie Mustache will be provided at your clinic appointment today. Please be sure to take these documents to your Investment banker, corporate. Your point of contact/Alesha Garrision indicated that these forms can be hand delivered to any staff member at the main office. The documents will be placed in her box if she is not available.  We will follow up via telephone on September 06, 2023 at 1230.  Please do not hesitate to call if you require assistance prior to our next outreach.   Katina Degree [facility]  [facility], [facility] RN Care Manager Direct Dial: [phone] Website: [email or web address]

## 2023-09-03 NOTE — Patient Instructions (Signed)
.   Please review the attached list of medications and notify my office if there are any errors.   . Please bring all of your medications to every appointment so we can make sure that our medication list is the same as yours.   

## 2023-09-04 LAB — COMPREHENSIVE METABOLIC PANEL
ALT: 9 [IU]/L (ref 0–44)
AST: 7 [IU]/L (ref 0–40)
Albumin: 4.3 g/dL (ref 3.9–4.9)
Alkaline Phosphatase: 72 [IU]/L (ref 44–121)
BUN/Creatinine Ratio: 19 (ref 10–24)
BUN: 19 mg/dL (ref 8–27)
Bilirubin Total: 0.2 mg/dL (ref 0.0–1.2)
CO2: 30 mmol/L — ABNORMAL HIGH (ref 20–29)
Calcium: 10.2 mg/dL (ref 8.6–10.2)
Chloride: 100 mmol/L (ref 96–106)
Creatinine, Ser: 1.02 mg/dL (ref 0.76–1.27)
Globulin, Total: 2 g/dL (ref 1.5–4.5)
Glucose: 137 mg/dL — ABNORMAL HIGH (ref 70–99)
Potassium: 5.2 mmol/L (ref 3.5–5.2)
Sodium: 143 mmol/L (ref 134–144)
Total Protein: 6.3 g/dL (ref 6.0–8.5)
eGFR: 80 mL/min/{1.73_m2} (ref >59)

## 2023-09-04 LAB — CBC
Hematocrit: 47.1 % (ref 37.5–51.0)
Hemoglobin: 15 g/dL (ref 13.0–17.7)
MCH: 29.5 pg (ref 26.6–33.0)
MCHC: 31.8 g/dL (ref 31.5–35.7)
MCV: 93 fL (ref 79–97)
Platelets: 200 10*3/uL (ref 150–450)
RBC: 5.09 x10E6/uL (ref 4.14–5.80)
RDW: 14.3 % (ref 11.6–15.4)
WBC: 5.6 10*3/uL (ref 3.4–10.8)

## 2023-09-06 ENCOUNTER — Other Ambulatory Visit: Payer: Self-pay | Admitting: Family Medicine

## 2023-09-06 ENCOUNTER — Other Ambulatory Visit: Payer: Self-pay

## 2023-09-06 DIAGNOSIS — M5442 Lumbago with sciatica, left side: Secondary | ICD-10-CM | POA: Diagnosis not present

## 2023-09-06 DIAGNOSIS — I110 Hypertensive heart disease with heart failure: Secondary | ICD-10-CM | POA: Insufficient documentation

## 2023-09-06 DIAGNOSIS — Z7984 Long term (current) use of oral hypoglycemic drugs: Secondary | ICD-10-CM | POA: Diagnosis not present

## 2023-09-06 DIAGNOSIS — I5032 Chronic diastolic (congestive) heart failure: Secondary | ICD-10-CM | POA: Insufficient documentation

## 2023-09-06 DIAGNOSIS — E119 Type 2 diabetes mellitus without complications: Secondary | ICD-10-CM | POA: Insufficient documentation

## 2023-09-06 DIAGNOSIS — I2510 Atherosclerotic heart disease of native coronary artery without angina pectoris: Secondary | ICD-10-CM | POA: Diagnosis not present

## 2023-09-06 DIAGNOSIS — J449 Chronic obstructive pulmonary disease, unspecified: Secondary | ICD-10-CM | POA: Diagnosis not present

## 2023-09-06 DIAGNOSIS — Z7901 Long term (current) use of anticoagulants: Secondary | ICD-10-CM | POA: Diagnosis not present

## 2023-09-06 DIAGNOSIS — Z79899 Other long term (current) drug therapy: Secondary | ICD-10-CM | POA: Insufficient documentation

## 2023-09-06 DIAGNOSIS — Z85828 Personal history of other malignant neoplasm of skin: Secondary | ICD-10-CM | POA: Insufficient documentation

## 2023-09-06 DIAGNOSIS — R1084 Generalized abdominal pain: Secondary | ICD-10-CM | POA: Diagnosis not present

## 2023-09-06 DIAGNOSIS — M545 Low back pain, unspecified: Secondary | ICD-10-CM | POA: Diagnosis present

## 2023-09-06 NOTE — Patient Outreach (Unsigned)
Care Management   Visit Note  09/06/2023 Name: Lawrence [name] MRN: [id number] DOB: [birth year]  Subjective: Lawrence [name] is a 67 y.o. year old male who is a primary care patient of [name], Demetrios Isaacs, MD. The Care Management team was consulted for assistance.      Engaged with patient via telephone.  Assessment:  Outpatient Encounter Medications as of 09/06/2023  Medication Sig Note   methocarbamol (ROBAXIN) 500 MG tablet TAKE 1 TABLET BY MOUTH EVERY 8 HOURS AS NEEDED FOR MUSCLE SPASMS 08/26/2023: Up to 4x a day   metoprolol succinate (TOPROL-XL) 25 MG 24 hr tablet TAKE 1/2 TABLET (12.5 MG TOTAL) BY MOUTHDAILY    Accu-Chek Softclix Lancets lancets Use as instructed to check sugar daily for type 2 diabetes.    allopurinol (ZYLOPRIM) 300 MG tablet TAKE 1 TABLET BY MOUTH TWICE A DAY    ALPRAZolam (XANAX) 1 MG tablet Take 1 tablet (1 mg total) by mouth 3 (three) times daily as needed. (Patient taking differently: Take 1 mg by mouth 3 (three) times daily.)    amLODipine (NORVASC) 5 MG tablet Take 5 mg by mouth daily.    atorvastatin (LIPITOR) 80 MG tablet TAKE 1 TABLET BY MOUTH AT BEDTIME    Blood Glucose Calibration (ACCU-CHEK GUIDE CONTROL) LIQD Use with blood glucose monitor as directed    Blood Glucose Monitoring Suppl (ACCU-CHEK GUIDE) w/Device KIT Use to check blood sugars as directed    Budeson-Glycopyrrol-Formoterol (BREZTRI AEROSPHERE) 160-9-4.8 MCG/ACT AERO Inhale 2 puffs into the lungs 2 (two) times daily.    bumetanide (BUMEX) 2 MG tablet Take 2 mg by mouth daily. As needed for swelling for 7 days (Patient not taking: Reported on 09/06/2023)    cetirizine (ZYRTEC) 10 MG tablet TAKE 1 TABLET BY MOUTH AT BEDTIME    clonazePAM (KLONOPIN) 0.5 MG tablet Take 0.5 mg by mouth 2 (two) times daily.    clopidogrel (PLAVIX) 75 MG tablet TAKE 1 TABLET BY MOUTH DAILY (Patient not taking: Reported on 09/03/2023)    Cyanocobalamin (VITAMIN B-12) 1000 MCG SUBL Place 1 tablet under the tongue  daily.    dicyclomine (BENTYL) 10 MG capsule Take 1 capsule (10 mg total) by mouth 3 (three) times daily as needed (abdominal pain).    ELIQUIS 5 MG TABS tablet TAKE 1 TABLET BY MOUTH TWICE A DAY    FARXIGA 10 MG TABS tablet Take 10 mg by mouth daily.    glipiZIDE (GLUCOTROL) 5 MG tablet Take 1 tablet (5 mg total) by mouth daily as needed (sugar over 300). (Patient not taking: Reported on 08/23/2023)    glucose blood (ACCU-CHEK GUIDE) test strip Use as instructed to check sugar daily for type 2 diabetes.    iron polysaccharides (NIFEREX) 150 MG capsule Take 1 capsule (150 mg total) by mouth daily.    isosorbide mononitrate (IMDUR) 120 MG 24 hr tablet TAKE 1 TABLET BY MOUTH DAILY    lidocaine (LIDODERM) 5 % Place 1 patch onto the skin daily. Remove & Discard patch within 12 hours or as directed by MD    lisinopril (ZESTRIL) 20 MG tablet Take 20 mg by mouth daily.    metFORMIN (GLUCOPHAGE-XR) 500 MG 24 hr tablet Take 1 tablet (500 mg total) by mouth 2 (two) times daily. (Patient taking differently: Take 500 mg by mouth daily.) 09/06/2023: Taking differently. Reports taking once tablet daily   nitroGLYCERIN (NITROSTAT) 0.4 MG SL tablet Place 1 tablet (0.4 mg total) under the tongue every 5 (five) minutes as needed  for chest pain.    omega-3 acid ethyl esters (LOVAZA) 1 g capsule TAKE 4 CAPSULES (4 GRAMS TOTAL) BY MOUTHDAILY    oxyCODONE-acetaminophen (PERCOCET) 10-325 MG tablet TAKE 1 TABLET BY MOUTH EVERY 4 HOURS AS NEEDED FOR PAIN    OXYGEN Inhale 2 L into the lungs at bedtime as needed (for shortness of breath).    pantoprazole (PROTONIX) 40 MG tablet Take 1 tablet (40 mg total) by mouth 2 (two) times daily for 14 days. 09/06/2023: Reports currently not taking. Will discuss further during GI follow up on 09/08/23.   ranolazine (RANEXA) 1000 MG SR tablet Take 1 tablet (1,000 mg total) by mouth 2 (two) times daily.    sacubitril-valsartan (ENTRESTO) 24-26 MG Take 1 tablet by mouth 2 (two) times daily.  (Patient not taking: Reported on 08/26/2023)    spironolactone (ALDACTONE) 25 MG tablet Take 1 tablet by mouth daily.    traZODone (DESYREL) 150 MG tablet TAKE 1 TABLET BY MOUTH AT BEDTIME (Patient taking differently: Take 100 mg by mouth at bedtime.)    venlafaxine XR (EFFEXOR-XR) 75 MG 24 hr capsule Take 75 mg by mouth daily with breakfast.    No facility-administered encounter medications on file as of 09/06/2023.    Interventions:   Goals Addressed             This Visit's Progress    Care Management       Current Barriers:  Care Management support and education needs related to Multiple Chronic Diseases Requires additional Care Giver support in the home  Planned Interventions: Patient was recently hospitalized at [facility]. Heart Catheterization was performed.  Reviewed medications and symptoms since discharge. Reports doing well today. Confirmed grandson [name] is available to assist in the home. Reviewed Cardiologist instructions to take Bumex for 7 days and start spironolactone 25 mg daily. Reports taking as prescribed. Reports BP is being monitored by [facility] staff and readings have been within range. Denies chest pain, palpitations, dizziness or headaches since discharge. Reviewed weight parameters. Reports weighing as advised. Reports weight today was 182 lbs. Verbalized need to notify the Cardiology team for weight gain greater than 2 lbs overnight or 5 lbs in a week. Reports minimal swelling to lower extremities. He notes this is an significant improvement. Continues to experience shortness of breath and fatigue with minimal exertion but feels that he is at his baseline. Reports not requiring supplemental oxygen during the day since hospital discharge. Currently using 2L/min at night. Reviewed nutritional needs. Reports pending meal delivery. Reports he initially thought the meals would be delivered by [facility] however meals will be delivered from a different agency.  Reports receiving a home visit today from the local agency and being informed that the meals were delayed. Unable to recall the name of the agency. Anticipates start of meal delivery within the next two weeks. Reports grandson [name] is currently available to assist with meal preparation if needed. Thorough discussion regarding worsening s/sx  and indications for seeking immediate medical attention. Update 08/25/23 Provided update regarding discussion with [facility]. Staff aware that Mr. Weisenbach will require in-home assistance. His grandson Odyn Siam will be staying at the residence to assist. Franklyn Lor will resend the form for [employer]. Will forward to PCP for review and signature once form is received.  Thorough discussion regarding worsening s/sx related to CHF exacerbation and indications for seeking immediate medical attention. Updated 09/03/23: Mr. Luhrsen confirmed receiving second delivery of meals on 09/02/23. [employer] forms  and letter forwarded. PCP to review and sign during the clinic visit today. Mr. Soderberg agreed to hand delivery forms to [facility] following his clinic visit. Update 09/06/23: Reports doing well today. Reports weight has remained stable. Reports some episodes of palpitations over the weekend. Reports decreased activity over the weekend d/t increased hip pain. Reports the pain is currently being controlled with oxycodone and tylenol. He is pending a call from the Home Health team regarding PT visit later today. Reports minimal swelling to his lower extremities. Denies abdominal swelling. Continues to experience dyspnea with activity. Denies symptoms at rest. Advised to continue using supplemental oxygen as needed. Reports currently only requiring at night with CPAP. Confirmed receipt of two week supply of frozen meals.  Confirmed Investment banker, corporate received the documents required for his  grandson [name] to remain in the apartment and serve as a caregiver.  Completed conference call with the [facility] Cardiology scheduling staff today. Mr. Giovino reports two appointments with Cardiology-on 10/23 and 10/30. Staff reported a scheduling conflict on 09/08/23. He will be evaluated by Dr. Nedra Hai on 09/15/23 at the [facility] office. Mr. Sama confirmed that his grandson will be available. Declined need for transportation assistance. Reviewed worsening s/sx that require immediate medical attention.    Self-Care Activities/Patient Goals: Take all medications as prescribed Keep scheduled appointments with the Dr. Lee/Cardiology on 09/15/23 Monitor weight and record readings Notify provider for weight gain outside of established parameters Limit sodium intake Follow recommended safety and fall prevention measures Monitor symptoms and seek immediate medical attention if symptoms worsen           PLAN Will follow up on 09/10/23.   Katina [facility]  [facility], [facility] RN Care Manager Direct Dial: [phone] Website: [email or web address]

## 2023-09-07 ENCOUNTER — Emergency Department
Admission: EM | Admit: 2023-09-07 | Discharge: 2023-09-07 | Disposition: A | Discharge status: Home / Self Care | Payer: Medicare HMO | Attending: Emergency Medicine | Admitting: Emergency Medicine

## 2023-09-07 ENCOUNTER — Other Ambulatory Visit: Payer: Self-pay

## 2023-09-07 DIAGNOSIS — M5432 Sciatica, left side: Secondary | ICD-10-CM

## 2023-09-07 DIAGNOSIS — I480 Paroxysmal atrial fibrillation: Secondary | ICD-10-CM | POA: Diagnosis not present

## 2023-09-07 DIAGNOSIS — I110 Hypertensive heart disease with heart failure: Secondary | ICD-10-CM | POA: Diagnosis not present

## 2023-09-07 DIAGNOSIS — M5442 Lumbago with sciatica, left side: Secondary | ICD-10-CM | POA: Diagnosis not present

## 2023-09-07 DIAGNOSIS — I255 Ischemic cardiomyopathy: Secondary | ICD-10-CM | POA: Diagnosis not present

## 2023-09-07 DIAGNOSIS — K219 Gastro-esophageal reflux disease without esophagitis: Secondary | ICD-10-CM | POA: Diagnosis not present

## 2023-09-07 DIAGNOSIS — I5032 Chronic diastolic (congestive) heart failure: Secondary | ICD-10-CM | POA: Diagnosis not present

## 2023-09-07 DIAGNOSIS — I2510 Atherosclerotic heart disease of native coronary artery without angina pectoris: Secondary | ICD-10-CM | POA: Diagnosis not present

## 2023-09-07 DIAGNOSIS — I252 Old myocardial infarction: Secondary | ICD-10-CM | POA: Diagnosis not present

## 2023-09-07 DIAGNOSIS — E119 Type 2 diabetes mellitus without complications: Secondary | ICD-10-CM | POA: Diagnosis not present

## 2023-09-07 DIAGNOSIS — J449 Chronic obstructive pulmonary disease, unspecified: Secondary | ICD-10-CM | POA: Diagnosis not present

## 2023-09-07 LAB — COMPREHENSIVE METABOLIC PANEL
ALT: 13 U/L (ref 0–44)
AST: 11 U/L — ABNORMAL LOW (ref 15–41)
Albumin: 3.9 g/dL (ref 3.5–5.0)
Alkaline Phosphatase: 56 U/L (ref 38–126)
Anion gap: 7 (ref 5–15)
BUN: 24 mg/dL — ABNORMAL HIGH (ref 8–23)
CO2: 28 mmol/L (ref 22–32)
Calcium: 9.8 mg/dL (ref 8.9–10.3)
Chloride: 104 mmol/L (ref 98–111)
Creatinine, Ser: 0.94 mg/dL (ref 0.61–1.24)
GFR, Estimated: >60 mL/min (ref >60)
Glucose, Bld: 131 mg/dL — ABNORMAL HIGH (ref 70–99)
Potassium: 4.8 mmol/L (ref 3.5–5.1)
Sodium: 139 mmol/L (ref 135–145)
Total Bilirubin: 0.5 mg/dL (ref 0.3–1.2)
Total Protein: 6.7 g/dL (ref 6.5–8.1)

## 2023-09-07 LAB — CBC
HCT: 50 % (ref 39.0–52.0)
Hemoglobin: 16 g/dL (ref 13.0–17.0)
MCH: 29.5 pg (ref 26.0–34.0)
MCHC: 32 g/dL (ref 30.0–36.0)
MCV: 92.3 fL (ref 80.0–100.0)
Platelets: 202 10*3/uL (ref 150–400)
RBC: 5.42 MIL/uL (ref 4.22–5.81)
RDW: 15.1 % (ref 11.5–15.5)
WBC: 7.2 10*3/uL (ref 4.0–10.5)
nRBC: 0 % (ref 0.0–0.2)

## 2023-09-07 LAB — TYPE AND SCREEN
ABO/RH(D): A POS
Antibody Screen: NEGATIVE

## 2023-09-07 MED ORDER — OXYCODONE-ACETAMINOPHEN 5-325 MG PO TABS
2.0000 | ORAL_TABLET | Freq: Once | ORAL | Status: AC
  Administered 2023-09-07: 2 via ORAL
  Filled 2023-09-07: qty 2

## 2023-09-07 MED ORDER — HYDROMORPHONE HCL 1 MG/ML IJ SOLN: 1.0000 mg | Freq: Once | INTRAMUSCULAR | Status: DC

## 2023-09-07 NOTE — ED Notes (Signed)
Patient medicated and provided with discharge instructions including importance of follow up appt with PCP with stated understanding. Patient stable and wheeled out to lobby to await ride from grandson.

## 2023-09-07 NOTE — ED Triage Notes (Signed)
Patient wheeled into triage with complaints of left sided back and hip pain. Patient denies traumatic injury including fall, he states unless he injured it at a previous fall. States this started hurting pretty bad last night.  Denies loss of bowel or bladder. Denies urinary symptoms. Patient does endorse one dark tarry stool PTA and is currently on plavix.

## 2023-09-07 NOTE — ED Provider Notes (Signed)
[facility] Provider Note    Event Date/Time   First MD Initiated Contact with Patient 09/07/23 [phone]     (approximate)   History   Back Pain   HPI  Lawrence [name] is a 67 y.o. male with history of CAD, CHF, COPD, hypertension, diabetes, chronic pain on oxycodone who presents to the emergency department complaints of left lower back pain that radiates down the leg.  Denies any injury to the back.  No numbness, tingling, weakness, bowel or bladder incontinence.  He is able to ambulate.  States his home Percocet has not been helping him.  He also told nursing staff that he had 1 dark-colored stool today.  This was earlier this morning.  No abdominal pain, vomiting.  No chest pain or shortness of breath.   History provided by patient.    Past Medical History:  Diagnosis Date   A-fib (HCC)    Anemia    Anginal pain (HCC)    Anxiety    Arthritis    Asthma    CAD (coronary artery disease)    a. 2002 CABGx2 (LIMA->LAD, VG->VG->OM1);  b. 09/2012 DES->OM;  c. 03/2015 [facility]CA of LAD [facility]) in setting of atretic LIMA; d. 05/2015 Cath [facility]): nonobs dzs; e. 06/2015 Cath ([facility]): LM nl, LAD 45p/d ISR, 50d, D1/2 small, LCX 50p/d ISR, OM1 70ost, 30 ISR, VG->OM1 50ost, 8m, LIMA->LAD 99p/d - atretic, RCA dom, nl; f.cath 10/16: 40-50%(FFR 0.90) pLAD, 75% (FFR 0.77) mLAD s/p PCI/DES, oRCA 40% (FFR0.95)   Cancer (HCC)    SKIN CANCER ON BACK   Celiac disease    Chronic diastolic CHF (congestive heart failure) (HCC)    a. 06/2009 Echo: EF 60-65%, Gr 1 DD, triv AI, mildly dil LA, nl RV.   COPD (chronic obstructive pulmonary disease) (HCC)    a. Chronic bronchitis and emphysema.   DDD (degenerative disc disease), lumbar    Diverticulosis    Dysrhythmia    Essential hypertension    GERD (gastroesophageal reflux disease)    History of hiatal hernia    History of kidney stones    H/O   History of tobacco abuse    a. Quit 2014.   Myocardial infarction [facility]) 2002   4 STENTS    Pancreatitis    PSVT (paroxysmal supraventricular tachycardia) (HCC)    a. 10/2012 Noted on Zio Patch.   Sleep apnea    LOST CORD TO CPAP -ONLY 02 @ BEDTIME   Tubular adenoma of colon    Type II diabetes mellitus (HCC)     Past Surgical History:  Procedure Laterality Date   BYPASS GRAFT     CARDIAC CATHETERIZATION N/A 07/12/2015   rocedure: Left Heart Cath and Cors/Grafts Angiography;  Surgeon: Lyn Records, MD;  Location: [facility] INVASIVE CV LAB;  Service: Cardiovascular;  Laterality: N/A;   CARDIAC CATHETERIZATION Right 10/07/2015   Procedure: Left Heart Cath and Cors/Grafts Angiography;  Surgeon: Laurier Nancy, MD;  Location: [facility] INVASIVE CV LAB;  Service: Cardiovascular;  Laterality: Right;   CARDIAC CATHETERIZATION N/A 04/06/2016   Procedure: Left Heart Cath and Coronary Angiography;  Surgeon: Alwyn Pea, MD;  Location: [facility] INVASIVE CV LAB;  Service: Cardiovascular;  Laterality: N/A;   CARDIAC CATHETERIZATION  04/06/2016   Procedure: Bypass Graft Angiography;  Surgeon: Alwyn Pea, MD;  Location: [facility] INVASIVE CV LAB;  Service: Cardiovascular;;   CARDIAC CATHETERIZATION N/A 11/02/2016   Procedure: Left Heart Cath and Cors/Grafts Angiography and possible PCI;  Surgeon: Gerda Diss  Salome Arnt, MD;  Location: [facility] INVASIVE CV LAB;  Service: Cardiovascular;  Laterality: N/A;   CARDIAC CATHETERIZATION N/A 11/02/2016   Procedure: Coronary Stent Intervention;  Surgeon: Alwyn Pea, MD;  Location: [facility] INVASIVE CV LAB;  Service: Cardiovascular;  Laterality: N/A;   CARDIOVERSION N/A 06/29/2022   Procedure: CARDIOVERSION;  Surgeon: Lamar Blinks, MD;  Location: [facility] ORS;  Service: Cardiovascular;  Laterality: N/A;   CHOLECYSTECTOMY     CIRCUMCISION N/A 06/09/2019   Procedure: CIRCUMCISION ADULT;  Surgeon: Sondra Come, MD;  Location: [facility] ORS;  Service: Urology;  Laterality: N/A;   COLONOSCOPY WITH PROPOFOL N/A 04/01/2018   Procedure: COLONOSCOPY WITH PROPOFOL;  Surgeon:  Scot Jun, MD;  Location: [facility] ENDOSCOPY;  Service: Endoscopy;  Laterality: N/A;   COLONOSCOPY WITH PROPOFOL N/A 05/01/2023   Procedure: COLONOSCOPY WITH PROPOFOL;  Surgeon: Toney Reil, MD;  Location: [facility] ENDOSCOPY;  Service: Gastroenterology;  Laterality: N/A;   ESOPHAGEAL DILATION     ESOPHAGOGASTRODUODENOSCOPY (EGD) WITH PROPOFOL N/A 04/01/2018   Procedure: ESOPHAGOGASTRODUODENOSCOPY (EGD) WITH PROPOFOL;  Surgeon: Scot Jun, MD;  Location: [facility] ENDOSCOPY;  Service: Endoscopy;  Laterality: N/A;   ESOPHAGOGASTRODUODENOSCOPY (EGD) WITH PROPOFOL N/A 05/01/2023   Procedure: ESOPHAGOGASTRODUODENOSCOPY (EGD) WITH PROPOFOL;  Surgeon: Toney Reil, MD;  Location: [facility] ENDOSCOPY;  Service: Gastroenterology;  Laterality: N/A;   GIVENS CAPSULE STUDY  05/01/2023   Procedure: GIVENS CAPSULE STUDY;  Surgeon: Toney Reil, MD;  Location: [facility] ENDOSCOPY;  Service: Gastroenterology;;   LEFT HEART CATH AND CORS/GRAFTS ANGIOGRAPHY N/A 06/12/2019   Procedure: LEFT HEART CATH AND CORS/GRAFTS ANGIOGRAPHY;  Surgeon: Dalia Heading, MD;  Location: [facility] INVASIVE CV LAB;  Service: Cardiovascular;  Laterality: N/A;   LEFT HEART CATH AND CORS/GRAFTS ANGIOGRAPHY N/A 03/11/2020   Procedure: LEFT HEART CATH AND CORS/GRAFTS ANGIOGRAPHY;  Surgeon: Marcina Millard, MD;  Location: [facility] INVASIVE CV LAB;  Service: Cardiovascular;  Laterality: N/A;   LEFT HEART CATH AND CORS/GRAFTS ANGIOGRAPHY N/A 05/01/2021   Procedure: LEFT HEART CATH AND CORS/GRAFTS ANGIOGRAPHY;  Surgeon: Lamar Blinks, MD;  Location: [facility] INVASIVE CV LAB;  Service: Cardiovascular;  Laterality: N/A;   LEFT HEART CATH AND CORS/GRAFTS ANGIOGRAPHY N/A 11/02/2022   Procedure: LEFT HEART CATH AND CORS/GRAFTS ANGIOGRAPHY;  Surgeon: Alwyn Pea, MD;  Location: [facility] INVASIVE CV LAB;  Service: Cardiovascular;  Laterality: N/A;   TONSILLECTOMY     VASCULAR SURGERY      MEDICATIONS:  Prior to Admission medications    Medication Sig Start Date End Date Taking? Authorizing Provider  Accu-Chek Softclix Lancets lancets Use as instructed to check sugar daily for type 2 diabetes. 03/03/21   Malva Limes, MD  allopurinol (ZYLOPRIM) 300 MG tablet TAKE 1 TABLET BY MOUTH TWICE A DAY 03/13/23   Malva Limes, MD  ALPRAZolam Prudy Feeler) 1 MG tablet Take 1 tablet (1 mg total) by mouth 3 (three) times daily as needed. Patient taking differently: Take 1 mg by mouth 3 (three) times daily. 07/15/23   Malva Limes, MD  amLODipine (NORVASC) 5 MG tablet Take 5 mg by mouth daily.    [provider]  atorvastatin (LIPITOR) 80 MG tablet TAKE 1 TABLET BY MOUTH AT BEDTIME 07/15/23   Malva Limes, MD  Blood Glucose Calibration (ACCU-CHEK GUIDE CONTROL) LIQD Use with blood glucose monitor as directed 02/20/21   Malva Limes, MD  Blood Glucose Monitoring Suppl (ACCU-CHEK GUIDE) w/Device KIT Use to check blood sugars as directed 02/20/21   Malva Limes, MD  Budeson-Glycopyrrol-Formoterol (BREZTRI AEROSPHERE) 160-9-4.8 MCG/ACT AERO Inhale 2 puffs into the lungs 2 (two) times daily. 06/02/23   Malva Limes, MD  bumetanide (BUMEX) 2 MG tablet Take 2 mg by mouth daily. As needed for swelling for 7 days Patient not taking: Reported on 09/06/2023    [provider]  cetirizine (ZYRTEC) 10 MG tablet TAKE 1 TABLET BY MOUTH AT BEDTIME 03/12/23   Malva Limes, MD  clonazePAM (KLONOPIN) 0.5 MG tablet Take 0.5 mg by mouth 2 (two) times daily.    [provider]  clopidogrel (PLAVIX) 75 MG tablet TAKE 1 TABLET BY MOUTH DAILY Patient not taking: Reported on 09/03/2023 08/16/23   Malva Limes, MD  Cyanocobalamin (VITAMIN B-12) 1000 MCG SUBL Place 1 tablet under the tongue daily.    [provider]  dicyclomine (BENTYL) 10 MG capsule Take 1 capsule (10 mg total) by mouth 3 (three) times daily as needed (abdominal pain). 05/14/23   Phineas Semen, MD  ELIQUIS 5 MG TABS tablet TAKE 1 TABLET BY MOUTH  TWICE A DAY 08/16/23   Malva Limes, MD  FARXIGA 10 MG TABS tablet Take 10 mg by mouth daily. 05/25/23   [provider]  glipiZIDE (GLUCOTROL) 5 MG tablet Take 1 tablet (5 mg total) by mouth daily as needed (sugar over 300). Patient not taking: Reported on 08/23/2023 03/16/23   Malva Limes, MD  glucose blood (ACCU-CHEK GUIDE) test strip Use as instructed to check sugar daily for type 2 diabetes. 03/03/21   Malva Limes, MD  iron polysaccharides (NIFEREX) 150 MG capsule Take 1 capsule (150 mg total) by mouth daily. 05/04/23   Enedina Finner, MD  isosorbide mononitrate (IMDUR) 120 MG 24 hr tablet TAKE 1 TABLET BY MOUTH DAILY 08/16/23   Malva Limes, MD  lidocaine (LIDODERM) 5 % Place 1 patch onto the skin daily. Remove & Discard patch within 12 hours or as directed by MD 08/09/23   Arnetha Courser, MD  lisinopril (ZESTRIL) 20 MG tablet Take 20 mg by mouth daily.    [provider]  metFORMIN (GLUCOPHAGE-XR) 500 MG 24 hr tablet Take 1 tablet (500 mg total) by mouth 2 (two) times daily. Patient taking differently: Take 500 mg by mouth daily. 07/21/23   Jacky Kindle, FNP  methocarbamol (ROBAXIN) 500 MG tablet TAKE 1 TABLET BY MOUTH EVERY 8 HOURS AS NEEDED FOR MUSCLE SPASMS 07/15/23   Malva Limes, MD  metoprolol succinate (TOPROL-XL) 25 MG 24 hr tablet TAKE 1/2 TABLET (12.5 MG TOTAL) BY MOUTHDAILY 08/26/23   Malva Limes, MD  nitroGLYCERIN (NITROSTAT) 0.4 MG SL tablet Place 1 tablet (0.4 mg total) under the tongue every 5 (five) minutes as needed for chest pain. 09/03/23   Malva Limes, MD  omega-3 acid ethyl esters (LOVAZA) 1 g capsule TAKE 4 CAPSULES (4 GRAMS TOTAL) BY MOUTHDAILY 07/09/23   Malva Limes, MD  oxyCODONE-acetaminophen (PERCOCET) 10-325 MG tablet TAKE 1 TABLET BY MOUTH EVERY 4 HOURS AS NEEDED FOR PAIN 08/12/23   Malva Limes, MD  OXYGEN Inhale 2 L into the lungs at bedtime as needed (for shortness of breath).    [provider]  pantoprazole  (PROTONIX) 40 MG tablet Take 1 tablet (40 mg total) by mouth 2 (two) times daily for 14 days. 06/08/23 08/07/23  Celso Amy, PA-C  ranolazine (RANEXA) 1000 MG SR tablet Take 1 tablet (1,000 mg total) by mouth 2 (two) times daily. 08/05/23   Marcelino Duster, MD  sacubitril-valsartan (ENTRESTO) 24-26 MG Take 1 tablet by mouth 2 (two) times daily. Patient not taking: Reported on 08/26/2023 08/09/23   Arnetha Courser, MD  spironolactone (ALDACTONE) 25 MG tablet Take 1 tablet by mouth daily. 08/19/23 11/17/23  [provider]  traZODone (DESYREL) 150 MG tablet TAKE 1 TABLET BY MOUTH AT BEDTIME Patient taking differently: Take 100 mg by mouth at bedtime. 11/24/22   Malva Limes, MD  venlafaxine XR (EFFEXOR-XR) 75 MG 24 hr capsule Take 75 mg by mouth daily with breakfast.    [provider]    Physical Exam   Triage Vital Signs: ED Triage Vitals  Encounter Vitals Group     BP 09/07/23 0003 120/77     Systolic BP Percentile --      Diastolic BP Percentile --      Pulse Rate 09/07/23 0003 63     Resp 09/07/23 0003 18     Temp 09/07/23 0003 97.8 F (36.6 C)     Temp Source 09/07/23 0003 Oral     SpO2 09/07/23 0003 97 %     Weight 09/07/23 0010 183 lb (83 kg)     Height 09/07/23 0010 5\' 7"  (1.702 m)     Head Circumference --      Peak Flow --      Pain Score 09/07/23 0010 9     Pain Loc --      Pain Education --      Exclude from Growth Chart --     Most recent vital signs: Vitals:   09/07/23 0522 09/07/23 0522  BP: (!) 162/82   Pulse: (!) 59   Resp:    Temp:  (!) 97.5 F (36.4 C)  SpO2: 97%     CONSTITUTIONAL: Alert, responds appropriately to questions.  Chronically ill-appearing, appears older than stated age HEAD: Normocephalic, atraumatic EYES: Conjunctivae clear, pupils appear equal, sclera nonicteric ENT: normal nose; moist mucous membranes NECK: Supple, normal ROM CARD: RRR; S1 and S2 appreciated RESP: Normal chest excursion without splinting or  tachypnea; breath sounds clear and equal bilaterally; no wheezes, no rhonchi, no rales, no hypoxia or respiratory distress, speaking full sentences ABD/GI: Non-distended; soft, non-tender, no rebound, no guarding, no peritoneal signs RECTAL:  Normal rectal tone, no gross blood or melena, guaiac NEGATIVE, no hemorrhoids appreciated, nontender rectal exam, no fecal impaction.  BACK: The back appears normal EXT: Normal ROM in all joints; no deformity noted, no edema SKIN: Normal color for age and race; warm; no rash on exposed skin NEURO: Moves all extremities equally, normal speech PSYCH: The patient's mood and manner are appropriate.   ED Results / Procedures / Treatments   LABS: (all labs ordered are listed, but only abnormal results are displayed) Labs Reviewed  COMPREHENSIVE METABOLIC PANEL - Abnormal; Notable for the following components:      Result Value   Glucose, Bld 131 (*)    BUN 24 (*)    AST 11 (*)    All other components within normal limits  CBC  POC OCCULT BLOOD, ED  TYPE AND SCREEN     EKG:   RADIOLOGY: My personal review and interpretation of imaging:    I have personally reviewed all radiology reports.   No results found.   PROCEDURES:  Critical Care performed: No    Procedures    IMPRESSION / MDM / ASSESSMENT AND PLAN / ED COURSE  I reviewed the triage vital signs and the nursing notes.    Patient here with  complaints of left-sided sciatica without focal neurologic deficits.  Also complains of 1 episode of dark-colored stool.  He is on Plavix.  The patient is on the cardiac monitor to evaluate for evidence of arrhythmia and/or significant heart rate changes.   DIFFERENTIAL DIAGNOSIS (includes but not limited to):   Sciatica, doubt cauda equina, epidural abscess or hematoma, discitis or osteomyelitis, transverse myelitis, fracture  Differential also includes possible upper versus lower GI bleed on Plavix, anemia.  Doubt colitis,  diverticulitis, bowel obstruction based on benign abdominal exam.   Patient's presentation is most consistent with acute presentation with potential threat to life or bodily function.   PLAN: Labs obtained from triage show normal hemoglobin.  Normal creatinine and LFTs.  No leukocytosis.  He has normal brown appearing stool on rectal exam that is guaiac negative without melena or hematochezia.  His abdominal exam is benign.  No indication for admission at this time.  As for his back pain, this seems to be likely lumbar radiculopathy.  Have offered him IM Dilaudid however patient does not have a safe ride home.  Will provide him with his home dose of oxycodone and have discussed with patient if he does not feel this is helping his pain he will need to talk to his pain management doctor.  No indication for emergent imaging of the back today.   MEDICATIONS GIVEN IN ED: Medications  oxyCODONE-acetaminophen (PERCOCET/ROXICET) 5-325 MG per tablet 2 tablet (2 tablets Oral Given 09/07/23 0521)     ED COURSE:  At this time, I do not feel there is any life-threatening condition present. I reviewed all nursing notes, vitals, pertinent previous records.  All lab and urine results, EKGs, imaging ordered have been independently reviewed and interpreted by myself.  I reviewed all available radiology reports from any imaging ordered this visit.  Based on my assessment, I feel the patient is safe to be discharged home without further emergent workup and can continue workup as an outpatient as needed. Discussed all findings, treatment plan as well as usual and customary return precautions.  They verbalize understanding and are comfortable with this plan.  Outpatient follow-up has been provided as needed.  All questions have been answered.    CONSULTS:  none   OUTSIDE RECORDS REVIEWED: Reviewed multiple previous cardiology notes.       FINAL CLINICAL IMPRESSION(S) / ED DIAGNOSES   Final diagnoses:   Left sided sciatica     Rx / DC Orders   ED Discharge Orders     None        Note:  This document was prepared using Dragon voice recognition software and may include unintentional dictation errors.   Gage Treiber, Layla Maw, DO 09/07/23 [phone]

## 2023-09-07 NOTE — Telephone Encounter (Signed)
Last refill by historical provider  Please advise

## 2023-09-08 ENCOUNTER — Ambulatory Visit: Payer: Medicare HMO | Admitting: Family Medicine

## 2023-09-08 ENCOUNTER — Ambulatory Visit: Payer: Self-pay | Admitting: *Deleted

## 2023-09-08 ENCOUNTER — Ambulatory Visit: Payer: Medicare HMO | Admitting: Gastroenterology

## 2023-09-08 NOTE — Patient Outreach (Signed)
Care Coordination   Follow Up Visit Note   09/08/2023 Name: Lawrence [name] MRN: [id number] DOB: 11/04/1956  Lawrence [name] is a 67 y.o. year old male who sees [name], Demetrios Isaacs, MD for primary care. I spoke with  Lawrence [name] by phone today.  What matters to the patients health and wellness today? RN followed up with the patient to make sure he received the post inpatient meal plan.  Patient has received the 14 meals from the post hospital admission from [facility] meal plan.    Goals Addressed   None     SDOH assessments and interventions completed:  Yes     Care Coordination Interventions:  Yes, provided   Follow up plan: No further intervention required.   Encounter Outcome:  Patient Visit Completed \  Lawrence [name] BSN RN [facility] Care [facility] [phone]

## 2023-09-09 ENCOUNTER — Other Ambulatory Visit: Payer: Self-pay

## 2023-09-09 ENCOUNTER — Ambulatory Visit: Payer: Self-pay

## 2023-09-09 ENCOUNTER — Encounter: Payer: Self-pay | Admitting: Emergency Medicine

## 2023-09-09 DIAGNOSIS — I255 Ischemic cardiomyopathy: Secondary | ICD-10-CM | POA: Diagnosis not present

## 2023-09-09 DIAGNOSIS — G8929 Other chronic pain: Secondary | ICD-10-CM | POA: Diagnosis not present

## 2023-09-09 DIAGNOSIS — Z1152 Encounter for screening for COVID-19: Secondary | ICD-10-CM | POA: Insufficient documentation

## 2023-09-09 DIAGNOSIS — I509 Heart failure, unspecified: Secondary | ICD-10-CM | POA: Diagnosis not present

## 2023-09-09 DIAGNOSIS — R079 Chest pain, unspecified: Secondary | ICD-10-CM | POA: Insufficient documentation

## 2023-09-09 DIAGNOSIS — I5042 Chronic combined systolic (congestive) and diastolic (congestive) heart failure: Secondary | ICD-10-CM | POA: Diagnosis not present

## 2023-09-09 DIAGNOSIS — R42 Dizziness and giddiness: Secondary | ICD-10-CM | POA: Insufficient documentation

## 2023-09-09 DIAGNOSIS — I25110 Atherosclerotic heart disease of native coronary artery with unstable angina pectoris: Secondary | ICD-10-CM | POA: Diagnosis not present

## 2023-09-09 DIAGNOSIS — I2510 Atherosclerotic heart disease of native coronary artery without angina pectoris: Secondary | ICD-10-CM | POA: Insufficient documentation

## 2023-09-09 DIAGNOSIS — I21A1 Myocardial infarction type 2: Secondary | ICD-10-CM | POA: Diagnosis not present

## 2023-09-09 DIAGNOSIS — R002 Palpitations: Secondary | ICD-10-CM | POA: Diagnosis not present

## 2023-09-09 DIAGNOSIS — E119 Type 2 diabetes mellitus without complications: Secondary | ICD-10-CM | POA: Insufficient documentation

## 2023-09-09 DIAGNOSIS — J449 Chronic obstructive pulmonary disease, unspecified: Secondary | ICD-10-CM | POA: Diagnosis not present

## 2023-09-09 DIAGNOSIS — R0602 Shortness of breath: Secondary | ICD-10-CM | POA: Diagnosis not present

## 2023-09-09 DIAGNOSIS — M5442 Lumbago with sciatica, left side: Secondary | ICD-10-CM | POA: Insufficient documentation

## 2023-09-09 DIAGNOSIS — I110 Hypertensive heart disease with heart failure: Secondary | ICD-10-CM | POA: Diagnosis not present

## 2023-09-09 DIAGNOSIS — R0789 Other chest pain: Secondary | ICD-10-CM | POA: Diagnosis not present

## 2023-09-09 DIAGNOSIS — K219 Gastro-esophageal reflux disease without esophagitis: Secondary | ICD-10-CM | POA: Diagnosis not present

## 2023-09-09 DIAGNOSIS — I480 Paroxysmal atrial fibrillation: Secondary | ICD-10-CM | POA: Diagnosis not present

## 2023-09-09 DIAGNOSIS — M5440 Lumbago with sciatica, unspecified side: Secondary | ICD-10-CM | POA: Insufficient documentation

## 2023-09-09 LAB — URINALYSIS, ROUTINE W REFLEX MICROSCOPIC
Bacteria, UA: NONE SEEN
Bilirubin Urine: NEGATIVE
Glucose, UA: >=500 mg/dL — AB
Hgb urine dipstick: NEGATIVE
Ketones, ur: NEGATIVE mg/dL
Leukocytes,Ua: NEGATIVE
Nitrite: NEGATIVE
Protein, ur: NEGATIVE mg/dL
Specific Gravity, Urine: 1.016 (ref 1.005–1.030)
pH: 5 (ref 5.0–8.0)

## 2023-09-09 LAB — CBC
HCT: 49 % (ref 39.0–52.0)
Hemoglobin: 16 g/dL (ref 13.0–17.0)
MCH: 29.5 pg (ref 26.0–34.0)
MCHC: 32.7 g/dL (ref 30.0–36.0)
MCV: 90.2 fL (ref 80.0–100.0)
Platelets: 190 10*3/uL (ref 150–400)
RBC: 5.43 MIL/uL (ref 4.22–5.81)
RDW: 15.2 % (ref 11.5–15.5)
WBC: 6.7 10*3/uL (ref 4.0–10.5)
nRBC: 0 % (ref 0.0–0.2)

## 2023-09-09 LAB — BASIC METABOLIC PANEL
Anion gap: 10 (ref 5–15)
BUN: 18 mg/dL (ref 8–23)
CO2: 25 mmol/L (ref 22–32)
Calcium: 9.7 mg/dL (ref 8.9–10.3)
Chloride: 99 mmol/L (ref 98–111)
Creatinine, Ser: 1.1 mg/dL (ref 0.61–1.24)
GFR, Estimated: >60 mL/min (ref >60)
Glucose, Bld: 127 mg/dL — ABNORMAL HIGH (ref 70–99)
Potassium: 4.5 mmol/L (ref 3.5–5.1)
Sodium: 134 mmol/L — ABNORMAL LOW (ref 135–145)

## 2023-09-09 NOTE — Patient Outreach (Signed)
Care Coordination   Follow Up Visit Note   09/09/2023 Name: Lawrence [name] MRN: [id number] DOB: February 21, 1956  Lawrence [name] is a 67 y.o. year old male who sees [name], Demetrios Isaacs, MD for primary care. I spoke with  Lawrence [name] by phone today.  What matters to the patients health and wellness today?  Patient received food assistance.  Patient has no other needs at this time and does not request a follow up.    Goals Addressed             This Visit's Progress    Care Coordination Activities       Interventions Today    Flowsheet Row Most Recent Value  General Interventions   General Interventions Discussed/Reviewed General Interventions Discussed, General Interventions Reviewed  [Pt reports he received his 2nd delivery of meals.  Pt has no other concerns.]                SDOH assessments and interventions completed:  No     Care Coordination Interventions:  Yes, provided   Follow up plan: No further intervention required.   Encounter Outcome:  Patient Visit Completed

## 2023-09-09 NOTE — Patient Instructions (Signed)
Visit Information  Thank you for taking time to visit with me today. Please don't hesitate to contact me if I can be of assistance to you.   Following are the goals we discussed today:  Goal achieved. Patient received food.  If you are experiencing a Mental Health or Behavioral Health Crisis or need someone to talk to, please call 911  Patient verbalizes understanding of instructions and care plan provided today and agrees to view in MyChart. Active MyChart status and patient understanding of how to access instructions and care plan via MyChart confirmed with patient.     No further follow up required: Patient does not request a follow up visit. Lysle Morales, BSW Social Worker [phone]

## 2023-09-09 NOTE — ED Triage Notes (Signed)
Pt reports dizziness since last night.  Had home health nurses out today who told him he may be dehydrated. Pt reports no falls or LOC

## 2023-09-10 ENCOUNTER — Telehealth: Payer: Self-pay

## 2023-09-10 ENCOUNTER — Emergency Department
Admission: EM | Admit: 2023-09-10 | Discharge: 2023-09-10 | Disposition: A | Discharge status: Home / Self Care | Payer: Medicare HMO | Attending: Emergency Medicine | Admitting: Emergency Medicine

## 2023-09-10 ENCOUNTER — Telehealth: Payer: Self-pay | Admitting: Family Medicine

## 2023-09-10 ENCOUNTER — Emergency Department: Payer: Medicare HMO

## 2023-09-10 ENCOUNTER — Other Ambulatory Visit: Payer: Self-pay

## 2023-09-10 ENCOUNTER — Other Ambulatory Visit: Payer: Self-pay | Admitting: Family Medicine

## 2023-09-10 ENCOUNTER — Encounter: Payer: Self-pay | Admitting: Emergency Medicine

## 2023-09-10 DIAGNOSIS — R0602 Shortness of breath: Secondary | ICD-10-CM | POA: Diagnosis not present

## 2023-09-10 DIAGNOSIS — E119 Type 2 diabetes mellitus without complications: Secondary | ICD-10-CM | POA: Insufficient documentation

## 2023-09-10 DIAGNOSIS — I509 Heart failure, unspecified: Secondary | ICD-10-CM | POA: Insufficient documentation

## 2023-09-10 DIAGNOSIS — R55 Syncope and collapse: Secondary | ICD-10-CM | POA: Diagnosis not present

## 2023-09-10 DIAGNOSIS — R279 Unspecified lack of coordination: Secondary | ICD-10-CM | POA: Diagnosis not present

## 2023-09-10 DIAGNOSIS — G8929 Other chronic pain: Secondary | ICD-10-CM | POA: Insufficient documentation

## 2023-09-10 DIAGNOSIS — I2510 Atherosclerotic heart disease of native coronary artery without angina pectoris: Secondary | ICD-10-CM | POA: Insufficient documentation

## 2023-09-10 DIAGNOSIS — Z743 Need for continuous supervision: Secondary | ICD-10-CM | POA: Diagnosis not present

## 2023-09-10 DIAGNOSIS — R42 Dizziness and giddiness: Secondary | ICD-10-CM | POA: Diagnosis not present

## 2023-09-10 DIAGNOSIS — R079 Chest pain, unspecified: Secondary | ICD-10-CM | POA: Insufficient documentation

## 2023-09-10 DIAGNOSIS — I10 Essential (primary) hypertension: Secondary | ICD-10-CM | POA: Diagnosis not present

## 2023-09-10 DIAGNOSIS — R002 Palpitations: Secondary | ICD-10-CM | POA: Diagnosis not present

## 2023-09-10 DIAGNOSIS — R0789 Other chest pain: Secondary | ICD-10-CM | POA: Diagnosis not present

## 2023-09-10 LAB — TROPONIN I (HIGH SENSITIVITY)
Troponin I (High Sensitivity): 24 ng/L — ABNORMAL HIGH (ref <18)
Troponin I (High Sensitivity): 20 ng/L — ABNORMAL HIGH (ref <18)

## 2023-09-10 LAB — RESP PANEL BY RT-PCR (RSV, FLU A&B, COVID)  RVPGX2
Influenza A by PCR: NEGATIVE
Influenza B by PCR: NEGATIVE
Resp Syncytial Virus by PCR: NEGATIVE
SARS Coronavirus 2 by RT PCR: NEGATIVE

## 2023-09-10 LAB — CBC
HCT: 48.7 % (ref 39.0–52.0)
Hemoglobin: 15.8 g/dL (ref 13.0–17.0)
MCH: 29.1 pg (ref 26.0–34.0)
MCHC: 32.4 g/dL (ref 30.0–36.0)
MCV: 89.7 fL (ref 80.0–100.0)
Platelets: 183 10*3/uL (ref 150–400)
RBC: 5.43 MIL/uL (ref 4.22–5.81)
RDW: 15.1 % (ref 11.5–15.5)
WBC: 7.7 10*3/uL (ref 4.0–10.5)
nRBC: 0 % (ref 0.0–0.2)

## 2023-09-10 LAB — BASIC METABOLIC PANEL
Anion gap: 9 (ref 5–15)
BUN: 19 mg/dL (ref 8–23)
CO2: 28 mmol/L (ref 22–32)
Calcium: 9.6 mg/dL (ref 8.9–10.3)
Chloride: 100 mmol/L (ref 98–111)
Creatinine, Ser: 1.06 mg/dL (ref 0.61–1.24)
GFR, Estimated: >60 mL/min (ref >60)
Glucose, Bld: 106 mg/dL — ABNORMAL HIGH (ref 70–99)
Potassium: 4.9 mmol/L (ref 3.5–5.1)
Sodium: 137 mmol/L (ref 135–145)

## 2023-09-10 MED ORDER — OXYCODONE-ACETAMINOPHEN 5-325 MG PO TABS
1.0000 | ORAL_TABLET | Freq: Once | ORAL | Status: AC
  Administered 2023-09-10: 1 via ORAL
  Filled 2023-09-10: qty 1

## 2023-09-10 MED ORDER — KETOROLAC TROMETHAMINE 15 MG/ML IJ SOLN
15.0000 mg | Freq: Once | INTRAMUSCULAR | Status: AC
  Administered 2023-09-10: 15 mg via INTRAVENOUS
  Filled 2023-09-10: qty 1

## 2023-09-10 MED ORDER — SODIUM CHLORIDE 0.9 % IV BOLUS
500.0000 mL | Freq: Once | INTRAVENOUS | Status: AC
  Administered 2023-09-10: 500 mL via INTRAVENOUS

## 2023-09-10 NOTE — Telephone Encounter (Signed)
Patient requested refill request for the following medications:   ALPRAZolam (XANAX) 1 MG tablet     oxyCODONE-acetaminophen (PERCOCET) 10-325 MG tablet  Please advise

## 2023-09-10 NOTE — ED Triage Notes (Signed)
Pt with substernal CP radiating to left arm and reports of "passing out for 10 min"  Pt took 3 nitroglycerin at home.  Has had nausea, no vomiting.  Pt was seen here last night for similar.

## 2023-09-10 NOTE — Patient Outreach (Signed)
Care Management   Outreach Note  09/10/2023 Name: Lawrence [name] MRN: [id number] DOB: 1956-02-03  An unsuccessful outreach attempt was made today for a scheduled Care Management visit.  Per chart review, patient was evaluated in the [facility] Emergency Department earlier today.  Follow Up Plan:  A HIPAA compliant phone message was left for the patient providing contact information and requesting a return call.    [facility]  [facility], [facility] RN Care Manager Direct Dial: [phone] Website: [email or web address]

## 2023-09-10 NOTE — Patient Outreach (Unsigned)
Care Management   Visit Note  09/10/2023 Name: Lawrence [name] MRN: [id number] DOB: 1956/05/11  Subjective: Lawrence [name] is a 67 y.o. year old male who is a primary care patient of [name], Demetrios Isaacs, MD. The Care Management team was consulted for assistance.      Engaged with patient via telephone.  Assessment:  Outpatient Encounter Medications as of 09/10/2023  Medication Sig Note   Accu-Chek Softclix Lancets lancets Use as instructed to check sugar daily for type 2 diabetes.    allopurinol (ZYLOPRIM) 300 MG tablet TAKE 1 TABLET BY MOUTH TWICE A DAY    ALPRAZolam (XANAX) 1 MG tablet Take 1 tablet (1 mg total) by mouth 3 (three) times daily as needed. (Patient taking differently: Take 1 mg by mouth 3 (three) times daily.) 09/10/2023: Needs new order   amLODipine (NORVASC) 5 MG tablet Take 5 mg by mouth daily.    atorvastatin (LIPITOR) 80 MG tablet TAKE 1 TABLET BY MOUTH AT BEDTIME    Blood Glucose Calibration (ACCU-CHEK GUIDE CONTROL) LIQD Use with blood glucose monitor as directed    Blood Glucose Monitoring Suppl (ACCU-CHEK GUIDE) w/Device KIT Use to check blood sugars as directed    Budeson-Glycopyrrol-Formoterol (BREZTRI AEROSPHERE) 160-9-4.8 MCG/ACT AERO Inhale 2 puffs into the lungs 2 (two) times daily.    bumetanide (BUMEX) 2 MG tablet Take 2 mg by mouth daily. As needed for swelling for 7 days (Patient not taking: Reported on 09/06/2023)    cetirizine (ZYRTEC) 10 MG tablet TAKE 1 TABLET BY MOUTH AT BEDTIME    clonazePAM (KLONOPIN) 0.5 MG tablet Take 0.5 mg by mouth 2 (two) times daily.    clopidogrel (PLAVIX) 75 MG tablet TAKE 1 TABLET BY MOUTH DAILY (Patient not taking: Reported on 09/03/2023)    Cyanocobalamin (VITAMIN B-12) 1000 MCG SUBL Place 1 tablet under the tongue daily.    dicyclomine (BENTYL) 10 MG capsule Take 1 capsule (10 mg total) by mouth 3 (three) times daily as needed (abdominal pain).    ELIQUIS 5 MG TABS tablet TAKE 1 TABLET BY MOUTH TWICE A DAY    FARXIGA  10 MG TABS tablet Take 10 mg by mouth daily.    glipiZIDE (GLUCOTROL) 5 MG tablet Take 1 tablet (5 mg total) by mouth daily as needed (sugar over 300). (Patient not taking: Reported on 08/23/2023)    glucose blood (ACCU-CHEK GUIDE) test strip Use as instructed to check sugar daily for type 2 diabetes.    iron polysaccharides (NIFEREX) 150 MG capsule Take 1 capsule (150 mg total) by mouth daily.    isosorbide mononitrate (IMDUR) 120 MG 24 hr tablet TAKE 1 TABLET BY MOUTH DAILY    lidocaine (LIDODERM) 5 % Place 1 patch onto the skin daily. Remove & Discard patch within 12 hours or as directed by MD    lisinopril (ZESTRIL) 20 MG tablet Take 20 mg by mouth daily.    metFORMIN (GLUCOPHAGE-XR) 500 MG 24 hr tablet Take 1 tablet (500 mg total) by mouth 2 (two) times daily. (Patient taking differently: Take 500 mg by mouth daily.) 09/06/2023: Taking differently. Reports taking once tablet daily   methocarbamol (ROBAXIN) 500 MG tablet TAKE 1 TABLET BY MOUTH EVERY 8 HOURS AS NEEDED FOR MUSCLE SPASMS 08/26/2023: Up to 4x a day   metoprolol succinate (TOPROL-XL) 25 MG 24 hr tablet TAKE 1/2 TABLET (12.5 MG TOTAL) BY MOUTHDAILY    nitroGLYCERIN (NITROSTAT) 0.4 MG SL tablet Place 1 tablet (0.4 mg total) under the tongue every 5 (five)  minutes as needed for chest pain.    omega-3 acid ethyl esters (LOVAZA) 1 g capsule TAKE 4 CAPSULES (4 GRAMS TOTAL) BY MOUTHDAILY    oxyCODONE-acetaminophen (PERCOCET) 10-325 MG tablet TAKE 1 TABLET BY MOUTH EVERY 4 HOURS AS NEEDED FOR PAIN    OXYGEN Inhale 2 L into the lungs at bedtime as needed (for shortness of breath).    pantoprazole (PROTONIX) 40 MG tablet Take 1 tablet (40 mg total) by mouth 2 (two) times daily for 14 days. 09/06/2023: Reports currently not taking. Will discuss further during GI follow up on 09/08/23.   ranolazine (RANEXA) 1000 MG SR tablet Take 1 tablet (1,000 mg total) by mouth 2 (two) times daily.    sacubitril-valsartan (ENTRESTO) 24-26 MG Take 1 tablet by  mouth 2 (two) times daily. (Patient not taking: Reported on 08/26/2023)    spironolactone (ALDACTONE) 25 MG tablet Take 1 tablet by mouth daily.    traZODone (DESYREL) 150 MG tablet TAKE 1 TABLET BY MOUTH AT BEDTIME (Patient taking differently: Take 100 mg by mouth at bedtime.)    venlafaxine XR (EFFEXOR-XR) 75 MG 24 hr capsule Take 75 mg by mouth daily with breakfast.    No facility-administered encounter medications on file as of 09/10/2023.    Interventions:   Goals Addressed             This Visit's Progress    Care Management       Current Barriers:  Care Management support and education needs related to Multiple Chronic Diseases Requires additional Care Giver support in the home  Planned Interventions: Patient was recently hospitalized at [facility]. Heart Catheterization was performed.  Reviewed medications and symptoms since discharge. Reports doing well today. Confirmed grandson [name] is available to assist in the home. Reviewed Cardiologist instructions to take Bumex for 7 days and start spironolactone 25 mg daily. Reports taking as prescribed. Reports BP is being monitored by [facility] staff and readings have been within range. Denies chest pain, palpitations, dizziness or headaches since discharge. Reviewed weight parameters. Reports weighing as advised. Reports weight today was 182 lbs. Verbalized need to notify the Cardiology team for weight gain greater than 2 lbs overnight or 5 lbs in a week. Reports minimal swelling to lower extremities. He notes this is an significant improvement. Continues to experience shortness of breath and fatigue with minimal exertion but feels that he is at his baseline. Reports not requiring supplemental oxygen during the day since hospital discharge. Currently using 2L/min at night. Reviewed nutritional needs. Reports pending meal delivery. Reports he initially thought the meals would be delivered by [facility] however meals will be delivered  from a different agency. Reports receiving a home visit today from the local agency and being informed that the meals were delayed. Unable to recall the name of the agency. Anticipates start of meal delivery within the next two weeks. Reports grandson [name] is currently available to assist with meal preparation if needed. Thorough discussion regarding worsening s/sx  and indications for seeking immediate medical attention. Update 08/25/23 Provided update regarding discussion with [employer]. Staff aware that Mr. Wojnarowski will require in-home assistance. His grandson Katai Alcala will be staying at the residence to assist. Franklyn Lor will resend the form for [employer]. Will forward to PCP for review and signature once form is received.  Thorough discussion regarding worsening s/sx related to CHF exacerbation and indications for seeking immediate medical attention. Updated 09/03/23: Mr. Shong confirmed receiving second delivery of meals on 09/02/23.  [employer] forms and letter forwarded. PCP to review and sign during the clinic visit today. Mr. Stoetzel agreed to hand delivery forms to [facility] following his clinic visit. Update 09/06/23: Reports doing well today. Reports weight has remained stable. Reports some episodes of palpitations over the weekend. Reports decreased activity over the weekend d/t increased hip pain. Reports the pain is currently being controlled with oxycodone and tylenol. He is pending a call from the Home Health team regarding PT visit later today. Reports minimal swelling to his lower extremities. Denies abdominal swelling. Continues to experience dyspnea with activity. Denies symptoms at rest. Advised to continue using supplemental oxygen as needed. Reports currently only requiring at night with CPAP. Confirmed receipt of two week supply of frozen meals.  Confirmed [employer] received the  documents required for his grandson [name] to remain in the apartment and serve as a caregiver.  Completed conference call with the [facility] Cardiology scheduling staff today. Mr. Hollenbach reports two appointments with Cardiology-on 10/23 and 10/30. Staff reported a scheduling conflict on 09/08/23. He will be evaluated by Dr. Nedra Hai on 09/15/23 at the [facility] office. Mr. Deford confirmed that his grandson will be available. Declined need for transportation assistance. Reviewed worsening s/sx that require immediate medical attention. Update 09/10/23: Mr. Dewinter was evaluated and treated in the [facility] Emergency Room due to lightheadedness. Reports contacting 911 after experiencing prolonged dizziness and weakness.  Reviewed symptoms since returning home today. Reports continued weakness and dizziness. BP was 106/66. Heartrate-65. Reports experiencing chest discomfort shortly after returning home. Reports chest pain was relieved after taking nitroglycerin. Reports a headache which he attributes to common after taking nitroglycerin. Denies palpitations. Denies shortness of breath. Reports not attempting to ambulate without assistance. Notes using his walker and having bedside commode available. Reports pending receipt of medications. He messaged PCP prior to call. Reviewed plan and upcoming appointments. He has been contacted by [facility]. Reports the team will be resuming home visits next week. He is scheduled for follow up with Dr. Lee/Cardiology on 09/15/23. Thorough discussion regarding safety, fall prevention measures, and worsening symptoms that require emergent medical attention.    Self-Care Activities/Patient Goals: Take all medications as prescribed Keep scheduled appointments with the Dr. Lee/Cardiology on 09/15/23 Monitor weight and record readings Notify provider for weight gain outside of established parameters Limit sodium intake Follow recommended safety and fall prevention  measures Monitor symptoms and seek immediate medical attention if symptoms worsen          PLAN Will follow up next week   [facility]  [facility] RN Care Manager Direct Dial: [phone] Website: [email or web address]

## 2023-09-10 NOTE — Discharge Instructions (Signed)
Please follow-up with your primary care provider and cardiology team for ongoing management.  Your primary care provider can help with your continued low back pain.

## 2023-09-10 NOTE — ED Notes (Signed)
Called [facility] to request transport back home waiting on [facility]

## 2023-09-10 NOTE — ED Provider Notes (Signed)
[facility] Provider Note    Event Date/Time   First MD Initiated Contact with Patient 09/10/23 0016     (approximate)   History   Dizziness   HPI Lawrence [name] is a 67 y.o. male with history of CAD, CHF, COPD, HTN, DM2, chronic pain on oxycodone who presents today for lightheadedness.  Patient states he has felt slightly more fatigued over the past several days.  He has had intermittent lightheadedness.  No syncopal episodes.  No dizziness or room spinning sensation.  Also associates some intermittent shortness of breath, vague intermittent chest pain, and nausea without vomiting.  Denies specific abdominal pain, diarrhea, constipation, leg pain, or leg swelling.  Patient does note he has chest pain 3-4 times per week every week and that this does not feel different for him at this time.  Reviewed recent chart notes that patient was here several days ago in the ED for chronic low back pain and sent out on oral opioids.     Physical Exam   Triage Vital Signs: ED Triage Vitals [09/09/23 1847]  Encounter Vitals Group     BP 120/76     Systolic BP Percentile      Diastolic BP Percentile      Pulse Rate (!) 57     Resp 16     Temp 98 F (36.7 C)     Temp Source Oral     SpO2 97 %     Weight      Height      Head Circumference      Peak Flow      Pain Score      Pain Loc      Pain Education      Exclude from Growth Chart     Most recent vital signs: Vitals:   09/10/23 0445 09/10/23 0500  BP:  127/80  Pulse: (!) 51 (!) 55  Resp: 12 12  Temp:    SpO2: 99% 97%   Physical Exam: I have reviewed the vital signs and nursing notes. General: Awake, alert, no acute distress.  Nontoxic appearing. Head:  Atraumatic, normocephalic.   ENT:  EOM intact, PERRL. Oral mucosa is pink and moist with no lesions. Neck: Neck is supple with full range of motion, No meningeal signs. Cardiovascular:  RRR, No murmurs. Peripheral pulses palpable and equal  bilaterally. Respiratory:  Symmetrical chest wall expansion.  No rhonchi, rales, or wheezes.  Good air movement throughout.  No use of accessory muscles.   Musculoskeletal:  No cyanosis or edema. Moving extremities with full ROM Abdomen:  Soft, nontender, nondistended. Neuro:  GCS 15, moving all four extremities, interacting appropriately. Speech clear. Psych:  Calm, appropriate.   Skin:  Warm, dry, no rash.    ED Results / Procedures / Treatments   Labs (all labs ordered are listed, but only abnormal results are displayed) Labs Reviewed  BASIC METABOLIC PANEL - Abnormal; Notable for the following components:      Result Value   Sodium 134 (*)    Glucose, Bld 127 (*)    All other components within normal limits  URINALYSIS, ROUTINE W REFLEX MICROSCOPIC - Abnormal; Notable for the following components:   Color, Urine YELLOW (*)    APPearance CLEAR (*)    Glucose, UA >=500 (*)    All other components within normal limits  TROPONIN I (HIGH SENSITIVITY) - Abnormal; Notable for the following components:   Troponin I (High Sensitivity) 24 (*)  All other components within normal limits  RESP PANEL BY RT-PCR (RSV, FLU A&B, COVID)  RVPGX2  CBC  CBG MONITORING, ED     EKG My EKG interpretation: Rate of 58, sinus bradycardia, normal axis, normal intervals.  No acute ST elevations or depressions.   RADIOLOGY Independently interpreted chest x-ray with no acute pathology   PROCEDURES:  Critical Care performed: No  Procedures   MEDICATIONS ORDERED IN ED: Medications  sodium chloride 0.9 % bolus 500 mL (0 mLs Intravenous Stopped 09/10/23 0410)  ketorolac (TORADOL) 15 MG/ML injection 15 mg (15 mg Intravenous Given 09/10/23 0110)  oxyCODONE-acetaminophen (PERCOCET/ROXICET) 5-325 MG per tablet 1 tablet (1 tablet Oral Given 09/10/23 0211)     IMPRESSION / MDM / ASSESSMENT AND PLAN / ED COURSE  I reviewed the triage vital signs and the nursing notes.                               Differential diagnosis includes, but is not limited to, dehydration, viral URI, viral GI infection, less likely ACS, chronic low back pain exacerbation, electrolyte abnormality.  Patient's presentation is most consistent with acute complicated illness / injury requiring diagnostic workup.  Patient is a 67 year old male presenting today for the initial complaint of palpitations.  EKG reassuring and vital signs were stable on arrival.  Physical exam largely unremarkable.  Patient noted at 1 point having chest pain but then stated this is his usual chest pain.  When asked again he said it was no longer present.  Patient had a troponin of 24 which is consistent with his prior baseline levels and with no chest pain at this time in the ED, did not think further was necessitated.  Chest x-ray unremarkable.  BMP and CBC unremarkable.  UA unremarkable aside from glucosuria.  Negative viral panel.  Patient was given Toradol and fluids with resolution of symptoms.  When I went to reassess patient, he stated he was here mostly just for his ongoing low back pain.  He was given additional pain medicine with resolution of this.  At this time, no ongoing symptoms and patient wishing to be discharged.  Patient was discharged with plan to follow-up with his cardiology and primary care provider.  Given strict return precautions.  The patient is on the cardiac monitor to evaluate for evidence of arrhythmia and/or significant heart rate changes. Clinical Course as of 09/10/23 0753  Fri Sep 10, 2023  0128 DG Chest Portable 1 View No acute pathology [DW]  0205 Troponin I (High Sensitivity)(!): 24 Consistent with prior baseline [DW]    Clinical Course User Index [DW] Janith Lima, MD     FINAL CLINICAL IMPRESSION(S) / ED DIAGNOSES   Final diagnoses:  Lightheadedness  Chronic left-sided low back pain with sciatica, sciatica laterality unspecified     Rx / DC Orders   ED Discharge Orders     None         Note:  This document was prepared using Dragon voice recognition software and may include unintentional dictation errors.   Janith Lima, MD 09/10/23 [phone]

## 2023-09-11 ENCOUNTER — Emergency Department
Admission: EM | Admit: 2023-09-11 | Discharge: 2023-09-11 | Disposition: A | Discharge status: Home / Self Care | Payer: Medicare HMO | Source: Home / Self Care | Attending: Emergency Medicine | Admitting: Emergency Medicine

## 2023-09-11 DIAGNOSIS — R42 Dizziness and giddiness: Secondary | ICD-10-CM | POA: Diagnosis not present

## 2023-09-11 DIAGNOSIS — G8929 Other chronic pain: Secondary | ICD-10-CM

## 2023-09-11 LAB — TROPONIN I (HIGH SENSITIVITY): Troponin I (High Sensitivity): 18 ng/L — ABNORMAL HIGH (ref <18)

## 2023-09-11 NOTE — ED Notes (Signed)
This RN went to patient's room to obtain vital signs and patient was not in room, and no where to be found in ED. Patient was waiting to be transported home via [facility]. This RN called patient's cell phone and verified patient's identity; patient stated he "left with a friend" and that he is now home. I advised patient to next time notify staff prior to ED departure in private vehicle. ED charge RN Erskine Squibb notified.

## 2023-09-11 NOTE — Discharge Instructions (Addendum)

## 2023-09-11 NOTE — ED Provider Notes (Signed)
[facility] Provider Note    Event Date/Time   First MD Initiated Contact with Patient 09/11/23 0216     (approximate)   History   Chest Pain   HPI Lawrence [name] is a 67 y.o. male who is well-known to the emergency department with 21 visits in 6 months.  He has a history of chronic chest pain as well as CHF and CAD and diabetes.  He formally saw Dr. Juliann Pares but says he is switched to a new doctor in [place].  He presents tonight with left-sided sharp chest pain that feels the same as his pain usually feels.  He told triage that he passed out at home but he did not mention this to me.  He denies shortness of breath and vomiting although he has had no nausea.  He was seen yesterday for the same symptoms.     Physical Exam   Triage Vital Signs: ED Triage Vitals  Encounter Vitals Group     BP 09/10/23 2254 127/73     Systolic BP Percentile --      Diastolic BP Percentile --      Pulse Rate 09/10/23 2254 65     Resp 09/10/23 2254 14     Temp 09/10/23 2254 97.6 F (36.4 C)     Temp Source 09/10/23 2254 Oral     SpO2 09/10/23 2254 93 %     Weight --      Height --      Head Circumference --      Peak Flow --      Pain Score 09/10/23 2255 9     Pain Loc --      Pain Education --      Exclude from Growth Chart --     Most recent vital signs: Vitals:   09/10/23 2254  BP: 127/73  Pulse: 65  Resp: 14  Temp: 97.6 F (36.4 C)  SpO2: 93%    General: Awake, alert, no obvious distress.  Disheveled.  CV:  Good peripheral perfusion.  Normal heart sounds, regular rate and rhythm. Resp:  Normal effort. Speaking easily and comfortably, no accessory muscle usage nor intercostal retractions.  Lungs clear to auscultation. Abd:  No distention.  No tenderness to palpation of the abdomen.  ED Results / Procedures / Treatments   Labs (all labs ordered are listed, but only abnormal results are displayed) Labs Reviewed  BASIC METABOLIC PANEL - Abnormal;  Notable for the following components:      Result Value   Glucose, Bld 106 (*)    All other components within normal limits  TROPONIN I (HIGH SENSITIVITY) - Abnormal; Notable for the following components:   Troponin I (High Sensitivity) 20 (*)    All other components within normal limits  TROPONIN I (HIGH SENSITIVITY) - Abnormal; Notable for the following components:   Troponin I (High Sensitivity) 18 (*)    All other components within normal limits  CBC     EKG  ED ECG REPORT I, Loleta Rose, the attending physician, personally viewed and interpreted this ECG.  Date: 09/10/2023 EKG Time: 22: 58 Rate: 63 Rhythm: normal sinus rhythm QRS Axis: normal Intervals: normal ST/T Wave abnormalities: Non-specific ST segment / T-wave changes, but no clear evidence of acute ischemia. Narrative Interpretation: no definitive evidence of acute ischemia; does not meet STEMI criteria.    RADIOLOGY I viewed and interpreted the patient's chest x-ray and I see no evidence of pneumonia or pneumothorax.  I also read the radiologist's report, which confirmed no acute findings.   PROCEDURES:  Critical Care performed: No  .1-3 Lead EKG Interpretation  Performed by: Loleta Rose, MD Authorized by: Loleta Rose, MD     Interpretation: normal     ECG rate:  65   ECG rate assessment: normal     Rhythm: sinus rhythm     Ectopy: none     Conduction: normal       IMPRESSION / MDM / ASSESSMENT AND PLAN / ED COURSE  I reviewed the triage vital signs and the nursing notes.                              Differential diagnosis includes, but is not limited to, chronic pain, ACS, PE, pneumonia, pneumothorax  Patient's presentation is most consistent with acute presentation with potential threat to life or bodily function.  Labs/studies ordered: EKG, two-view chest x-ray,High-sensitivity troponin x 2, basic metabolic panel, CBC  Interventions/Medications given:  Medications - No data to  display  (Note:  hospital course my include additional interventions and/or labs/studies not listed above.)   Patient has chronic disease but also chronic chest pain for which she has been seen multiple times including over the last 24 hours.  Chronically and very minimally elevated high-sensitivity troponin that is stable.  No ischemic changes on EKG, normal CBC and basic metabolic panel.  The patient is on the cardiac monitor to evaluate for evidence of arrhythmia and/or significant heart rate changes.   Evaluation is reassuring.  Symptoms appear chronic and not an immediate threat to life or limb.  I provided this information to the patient and he is comfortable with the plan for discharge and outpatient follow-up.  I gave my usual and customary follow-up recommendations and return precautions.     FINAL CLINICAL IMPRESSION(S) / ED DIAGNOSES   Final diagnoses:  Chronic chest pain     Rx / DC Orders   ED Discharge Orders     None        Note:  This document was prepared using Dragon voice recognition software and may include unintentional dictation errors.   Loleta Rose, MD 09/11/23 [phone]

## 2023-09-13 ENCOUNTER — Other Ambulatory Visit: Payer: Self-pay | Admitting: Family Medicine

## 2023-09-13 DIAGNOSIS — I21A1 Myocardial infarction type 2: Secondary | ICD-10-CM | POA: Diagnosis not present

## 2023-09-13 DIAGNOSIS — I5042 Chronic combined systolic (congestive) and diastolic (congestive) heart failure: Secondary | ICD-10-CM | POA: Diagnosis not present

## 2023-09-13 DIAGNOSIS — J449 Chronic obstructive pulmonary disease, unspecified: Secondary | ICD-10-CM | POA: Diagnosis not present

## 2023-09-13 DIAGNOSIS — I160 Hypertensive urgency: Secondary | ICD-10-CM

## 2023-09-13 DIAGNOSIS — E119 Type 2 diabetes mellitus without complications: Secondary | ICD-10-CM | POA: Diagnosis not present

## 2023-09-13 DIAGNOSIS — I255 Ischemic cardiomyopathy: Secondary | ICD-10-CM | POA: Diagnosis not present

## 2023-09-13 DIAGNOSIS — I110 Hypertensive heart disease with heart failure: Secondary | ICD-10-CM | POA: Diagnosis not present

## 2023-09-13 DIAGNOSIS — K219 Gastro-esophageal reflux disease without esophagitis: Secondary | ICD-10-CM | POA: Diagnosis not present

## 2023-09-13 DIAGNOSIS — I480 Paroxysmal atrial fibrillation: Secondary | ICD-10-CM | POA: Diagnosis not present

## 2023-09-13 DIAGNOSIS — I25110 Atherosclerotic heart disease of native coronary artery with unstable angina pectoris: Secondary | ICD-10-CM | POA: Diagnosis not present

## 2023-09-13 MED ORDER — ALPRAZOLAM 1 MG PO TABS: 1.0000 mg | ORAL_TABLET | Freq: Three times a day (TID) | ORAL | 3 refills | Status: DC | PRN

## 2023-09-13 NOTE — Telephone Encounter (Signed)
Medication Refill - Medication:  amLODipine (NORVASC) 5 MG tablet  Patient states that another Dr wrote it and Dr is not responding to the refill for this and he wanted this prescription sent to Dr Sherrie Mustache for refills.  *completely out, took last pill this morning & not sure what to do if he can not get a refill.  Has the patient contacted their pharmacy? Yes.  Preferred Pharmacy (with phone number or street name):  [facility] - [place], [place] - [address]  Phone: [phone] Fax: [phone]  Has the patient been seen for an appointment in the last year OR does the patient have an upcoming appointment? Yes. F/U on 11.8.24 with PCP

## 2023-09-14 DIAGNOSIS — I21A1 Myocardial infarction type 2: Secondary | ICD-10-CM | POA: Diagnosis not present

## 2023-09-14 DIAGNOSIS — I480 Paroxysmal atrial fibrillation: Secondary | ICD-10-CM | POA: Diagnosis not present

## 2023-09-14 DIAGNOSIS — K219 Gastro-esophageal reflux disease without esophagitis: Secondary | ICD-10-CM | POA: Diagnosis not present

## 2023-09-14 DIAGNOSIS — E119 Type 2 diabetes mellitus without complications: Secondary | ICD-10-CM | POA: Diagnosis not present

## 2023-09-14 DIAGNOSIS — J449 Chronic obstructive pulmonary disease, unspecified: Secondary | ICD-10-CM | POA: Diagnosis not present

## 2023-09-14 DIAGNOSIS — I5042 Chronic combined systolic (congestive) and diastolic (congestive) heart failure: Secondary | ICD-10-CM | POA: Diagnosis not present

## 2023-09-14 DIAGNOSIS — I110 Hypertensive heart disease with heart failure: Secondary | ICD-10-CM | POA: Diagnosis not present

## 2023-09-14 DIAGNOSIS — I255 Ischemic cardiomyopathy: Secondary | ICD-10-CM | POA: Diagnosis not present

## 2023-09-14 DIAGNOSIS — I25110 Atherosclerotic heart disease of native coronary artery with unstable angina pectoris: Secondary | ICD-10-CM | POA: Diagnosis not present

## 2023-09-14 NOTE — Telephone Encounter (Signed)
Requested medication (s) are due for refill today:   Not sure  Requested medication (s) are on the active medication list:   As historical med  Future visit scheduled:   Yes 11/8 with Dr. Sherrie Mustache   Last ordered: Historical.   Last prescribed by another provider.    See pt's note.   He is asking if Dr. Sherrie Mustache will refill this for him.   Requested Prescriptions  Pending Prescriptions Disp Refills   amLODipine (NORVASC) 5 MG tablet      Sig: Take 1 tablet (5 mg total) by mouth daily.     Cardiovascular: Calcium Channel Blockers 2 Passed - 09/13/2023 10:33 AM      Passed - Last BP in normal range    BP Readings from Last 1 Encounters:  09/10/23 127/73         Passed - Last Heart Rate in normal range    Pulse Readings from Last 1 Encounters:  09/10/23 65         Passed - Valid encounter within last 6 months    Recent Outpatient Visits           1 week ago Mixed hyperlipidemia   [facility] Malva Limes, MD   1 month ago FTT (failure to thrive) in adult   [facility] Merita Norton T, FNP   3 months ago Coronary artery disease of native artery of native heart with stable angina pectoris [facility])   [name] Malva Limes, MD   4 months ago No-show for appointment   [facility] Merita Norton T, FNP   5 months ago Pleuritic chest pain   [facility] Malva Limes, MD       Future Appointments             In 1 week [name], Demetrios Isaacs, MD [facility], [name]   In 2 months [name], Demetrios Isaacs, MD [facility], [name]

## 2023-09-15 DIAGNOSIS — I4891 Unspecified atrial fibrillation: Secondary | ICD-10-CM | POA: Diagnosis not present

## 2023-09-15 DIAGNOSIS — I5032 Chronic diastolic (congestive) heart failure: Secondary | ICD-10-CM | POA: Diagnosis not present

## 2023-09-15 DIAGNOSIS — E7849 Other hyperlipidemia: Secondary | ICD-10-CM | POA: Diagnosis not present

## 2023-09-15 DIAGNOSIS — E7801 Familial hypercholesterolemia: Secondary | ICD-10-CM | POA: Diagnosis not present

## 2023-09-15 DIAGNOSIS — I110 Hypertensive heart disease with heart failure: Secondary | ICD-10-CM | POA: Diagnosis not present

## 2023-09-15 DIAGNOSIS — J431 Panlobular emphysema: Secondary | ICD-10-CM | POA: Diagnosis not present

## 2023-09-15 DIAGNOSIS — I252 Old myocardial infarction: Secondary | ICD-10-CM | POA: Diagnosis not present

## 2023-09-15 DIAGNOSIS — R079 Chest pain, unspecified: Secondary | ICD-10-CM | POA: Diagnosis not present

## 2023-09-15 DIAGNOSIS — I2510 Atherosclerotic heart disease of native coronary artery without angina pectoris: Secondary | ICD-10-CM | POA: Diagnosis not present

## 2023-09-15 MED ORDER — AMLODIPINE BESYLATE 5 MG PO TABS: 5.0000 mg | ORAL_TABLET | Freq: Every day | ORAL | 2 refills | Status: DC

## 2023-09-17 ENCOUNTER — Other Ambulatory Visit: Payer: Self-pay

## 2023-09-17 ENCOUNTER — Telehealth: Payer: Self-pay

## 2023-09-17 NOTE — Patient Outreach (Signed)
  Care Management   Outreach Note  09/17/2023 Name: Lawrence [name] MRN: [id number] DOB: 08/29/1956  An unsuccessful telephone outreach was attempted today to contact the patient about Care Management needs.    Follow Up Plan:  A HIPAA compliant phone message was left for the patient providing contact information and requesting a return call.    [facility]  [facility], [facility] Health RN Care Manager Direct Dial: [phone] Website: [email or web address]

## 2023-09-18 DIAGNOSIS — E785 Hyperlipidemia, unspecified: Secondary | ICD-10-CM | POA: Diagnosis not present

## 2023-09-18 DIAGNOSIS — I5032 Chronic diastolic (congestive) heart failure: Secondary | ICD-10-CM | POA: Diagnosis not present

## 2023-09-18 DIAGNOSIS — R0602 Shortness of breath: Secondary | ICD-10-CM | POA: Diagnosis not present

## 2023-09-18 DIAGNOSIS — I2510 Atherosclerotic heart disease of native coronary artery without angina pectoris: Secondary | ICD-10-CM | POA: Diagnosis not present

## 2023-09-18 DIAGNOSIS — E119 Type 2 diabetes mellitus without complications: Secondary | ICD-10-CM | POA: Diagnosis not present

## 2023-09-18 DIAGNOSIS — I4891 Unspecified atrial fibrillation: Secondary | ICD-10-CM | POA: Diagnosis not present

## 2023-09-18 DIAGNOSIS — R058 Other specified cough: Secondary | ICD-10-CM | POA: Diagnosis not present

## 2023-09-18 DIAGNOSIS — R079 Chest pain, unspecified: Secondary | ICD-10-CM | POA: Diagnosis not present

## 2023-09-18 DIAGNOSIS — R0789 Other chest pain: Secondary | ICD-10-CM | POA: Diagnosis not present

## 2023-09-18 DIAGNOSIS — Z87891 Personal history of nicotine dependence: Secondary | ICD-10-CM | POA: Diagnosis not present

## 2023-09-18 DIAGNOSIS — R110 Nausea: Secondary | ICD-10-CM | POA: Diagnosis not present

## 2023-09-18 DIAGNOSIS — Z951 Presence of aortocoronary bypass graft: Secondary | ICD-10-CM | POA: Diagnosis not present

## 2023-09-18 DIAGNOSIS — I255 Ischemic cardiomyopathy: Secondary | ICD-10-CM | POA: Diagnosis not present

## 2023-09-18 DIAGNOSIS — I110 Hypertensive heart disease with heart failure: Secondary | ICD-10-CM | POA: Diagnosis not present

## 2023-09-18 DIAGNOSIS — I480 Paroxysmal atrial fibrillation: Secondary | ICD-10-CM | POA: Diagnosis not present

## 2023-09-18 DIAGNOSIS — R197 Diarrhea, unspecified: Secondary | ICD-10-CM | POA: Diagnosis not present

## 2023-09-18 DIAGNOSIS — J449 Chronic obstructive pulmonary disease, unspecified: Secondary | ICD-10-CM | POA: Diagnosis not present

## 2023-09-19 DIAGNOSIS — J449 Chronic obstructive pulmonary disease, unspecified: Secondary | ICD-10-CM | POA: Diagnosis not present

## 2023-09-19 DIAGNOSIS — I5032 Chronic diastolic (congestive) heart failure: Secondary | ICD-10-CM | POA: Diagnosis not present

## 2023-09-19 DIAGNOSIS — I110 Hypertensive heart disease with heart failure: Secondary | ICD-10-CM | POA: Diagnosis not present

## 2023-09-19 DIAGNOSIS — R079 Chest pain, unspecified: Secondary | ICD-10-CM | POA: Diagnosis not present

## 2023-09-20 NOTE — Patient Outreach (Signed)
Care Management   Visit Note   Name: Lawrence [name] MRN: [id number] DOB: December 08, 1955  Subjective: Lawrence [name] is a 67 y.o. year old male who is a primary care patient of [name], Demetrios Isaacs, MD. The Care Management team was consulted for assistance.      Engaged with patient via telephone  Assessment:  Outpatient Encounter Medications as of 09/17/2023  Medication Sig Note   carvedilol (COREG) 3.125 MG tablet Take 3.125 mg by mouth 2 (two) times daily with a meal.    Accu-Chek Softclix Lancets lancets Use as instructed to check sugar daily for type 2 diabetes.    allopurinol (ZYLOPRIM) 300 MG tablet TAKE 1 TABLET BY MOUTH TWICE A DAY    ALPRAZolam (XANAX) 1 MG tablet Take 1 tablet (1 mg total) by mouth 3 (three) times daily as needed.    amLODipine (NORVASC) 5 MG tablet Take 1 tablet (5 mg total) by mouth daily.    atorvastatin (LIPITOR) 80 MG tablet TAKE 1 TABLET BY MOUTH AT BEDTIME    Blood Glucose Calibration (ACCU-CHEK GUIDE CONTROL) LIQD Use with blood glucose monitor as directed    Blood Glucose Monitoring Suppl (ACCU-CHEK GUIDE) w/Device KIT Use to check blood sugars as directed    Budeson-Glycopyrrol-Formoterol (BREZTRI AEROSPHERE) 160-9-4.8 MCG/ACT AERO Inhale 2 puffs into the lungs 2 (two) times daily.    bumetanide (BUMEX) 2 MG tablet Take 2 mg by mouth daily. As needed for swelling for 7 days (Patient not taking: Reported on 09/06/2023)    cetirizine (ZYRTEC) 10 MG tablet TAKE 1 TABLET BY MOUTH AT BEDTIME    clonazePAM (KLONOPIN) 0.5 MG tablet Take 0.5 mg by mouth 2 (two) times daily.    clopidogrel (PLAVIX) 75 MG tablet TAKE 1 TABLET BY MOUTH DAILY (Patient not taking: Reported on 09/03/2023)    Cyanocobalamin (VITAMIN B-12) 1000 MCG SUBL Place 1 tablet under the tongue daily.    dicyclomine (BENTYL) 10 MG capsule Take 1 capsule (10 mg total) by mouth 3 (three) times daily as needed (abdominal pain).    ELIQUIS 5 MG TABS tablet TAKE 1 TABLET BY MOUTH TWICE A DAY    FARXIGA  10 MG TABS tablet Take 10 mg by mouth daily.    glipiZIDE (GLUCOTROL) 5 MG tablet Take 1 tablet (5 mg total) by mouth daily as needed (sugar over 300). (Patient not taking: Reported on 08/23/2023)    glucose blood (ACCU-CHEK GUIDE) test strip Use as instructed to check sugar daily for type 2 diabetes.    iron polysaccharides (FERREX 150) 150 MG capsule TAKE 1 CAPSULE BY MOUTH DAILY    isosorbide mononitrate (IMDUR) 120 MG 24 hr tablet TAKE 1 TABLET BY MOUTH DAILY    lidocaine (LIDODERM) 5 % Place 1 patch onto the skin daily. Remove & Discard patch within 12 hours or as directed by MD    lisinopril (ZESTRIL) 20 MG tablet Take 20 mg by mouth daily.    metFORMIN (GLUCOPHAGE-XR) 500 MG 24 hr tablet Take 1 tablet (500 mg total) by mouth 2 (two) times daily. (Patient taking differently: Take 500 mg by mouth daily.) 09/06/2023: Taking differently. Reports taking once tablet daily   methocarbamol (ROBAXIN) 500 MG tablet TAKE 1 TABLET BY MOUTH EVERY 8 HOURS AS NEEDED FOR MUSCLE SPASMS 08/26/2023: Up to 4x a day   metoprolol succinate (TOPROL-XL) 25 MG 24 hr tablet TAKE 1/2 TABLET (12.5 MG TOTAL) BY MOUTHDAILY    nitroGLYCERIN (NITROSTAT) 0.4 MG SL tablet Place 1 tablet (0.4 mg total)  under the tongue every 5 (five) minutes as needed for chest pain.    omega-3 acid ethyl esters (LOVAZA) 1 g capsule TAKE 4 CAPSULES (4 GRAMS TOTAL) BY MOUTHDAILY    oxyCODONE-acetaminophen (PERCOCET) 10-325 MG tablet TAKE 1 TABLET BY MOUTH EVERY 4 HOURS AS NEEDED FOR PAIN    OXYGEN Inhale 2 L into the lungs at bedtime as needed (for shortness of breath).    pantoprazole (PROTONIX) 40 MG tablet Take 1 tablet (40 mg total) by mouth 2 (two) times daily for 14 days. 09/06/2023: Reports currently not taking. Will discuss further during GI follow up on 09/08/23.   ranolazine (RANEXA) 1000 MG SR tablet Take 1 tablet (1,000 mg total) by mouth 2 (two) times daily.    sacubitril-valsartan (ENTRESTO) 24-26 MG Take 1 tablet by mouth 2 (two)  times daily. (Patient not taking: Reported on 08/26/2023)    spironolactone (ALDACTONE) 25 MG tablet Take 1 tablet by mouth daily.    traZODone (DESYREL) 150 MG tablet TAKE 1 TABLET BY MOUTH AT BEDTIME (Patient taking differently: Take 100 mg by mouth at bedtime.)    venlafaxine XR (EFFEXOR-XR) 75 MG 24 hr capsule Take 75 mg by mouth daily with breakfast.    No facility-administered encounter medications on file as of 09/17/2023.    Interventions:   Goals Addressed             This Visit's Progress    Care Management       Current Barriers:  Care Management support and education needs related to Multiple Chronic Diseases Requires additional Care Giver support in the home  Planned Interventions: Patient was recently hospitalized at [facility]. Heart Catheterization was performed.  Reviewed medications and symptoms since discharge. Reports doing well today. Confirmed grandson Lawrence [name] is available to assist in the home. Reviewed Cardiologist instructions to take Bumex for 7 days and start spironolactone 25 mg daily. Reports taking as prescribed. Reports BP is being monitored by [facility] staff and readings have been within range. Denies chest pain, palpitations, dizziness or headaches since discharge. Reviewed weight parameters. Reports weighing as advised. Reports weight today was 182 lbs. Verbalized need to notify the Cardiology team for weight gain greater than 2 lbs overnight or 5 lbs in a week. Reports minimal swelling to lower extremities. He notes this is an significant improvement. Continues to experience shortness of breath and fatigue with minimal exertion but feels that he is at his baseline. Reports not requiring supplemental oxygen during the day since hospital discharge. Currently using 2L/min at night. Reviewed nutritional needs. Reports pending meal delivery. Reports he initially thought the meals would be delivered by [facility] [employer] however meals will be delivered from a different  agency. Reports receiving a home visit today from the local agency and being informed that the meals were delayed. Unable to recall the name of the agency. Anticipates start of meal delivery within the next two weeks. Reports grandson Lawrence [name] is currently available to assist with meal preparation if needed. Thorough discussion regarding worsening s/sx  and indications for seeking immediate medical attention. Update 08/25/23 Provided update regarding discussion with [employer]. Staff aware that Lawrence [name] will require in-home assistance. His grandson Lawrence [name] will be staying at the residence to assist. Lawrence [name] will resend the form for [employer]. Will forward to PCP for review and signature once form is received.  Thorough discussion regarding worsening s/sx related to CHF exacerbation and indications for seeking immediate medical attention. Updated 09/03/23: Lawrence [name] confirmed receiving  second delivery of meals on 09/02/23. [employer] forms and letter forwarded. PCP to review and sign during the clinic visit today. Lawrence [name] agreed to hand delivery forms to [facility] following his clinic visit. Update 09/06/23: Reports doing well today. Reports weight has remained stable. Reports some episodes of palpitations over the weekend. Reports decreased activity over the weekend d/t increased hip pain. Reports the pain is currently being controlled with oxycodone and tylenol. He is pending a call from the Home Health team regarding PT visit later today. Reports minimal swelling to his lower extremities. Denies abdominal swelling. Continues to experience dyspnea with activity. Denies symptoms at rest. Advised to continue using supplemental oxygen as needed. Reports currently only requiring at night with CPAP. Confirmed receipt of two week supply of frozen meals.  Confirmed [employer], [employer] received the documents required  for his grandson Lawrence [name] to remain in the apartment and serve as a caregiver.  Completed conference call with the [facility] Cardiology scheduling staff today. Lawrence [name] reports two appointments with Cardiology-on 10/23 and 10/30. Staff reported a scheduling conflict on 09/08/23. He will be evaluated by Lawrence [name] on 09/15/23 at the [facility] office. Lawrence [name] confirmed that his grandson will be available. Declined need for transportation assistance. Reviewed worsening s/sx that require immediate medical attention. Update 09/10/23: Lawrence [name] was evaluated and treated in the [facility] Emergency Room due to lightheadedness. Reports contacting 911 after experiencing prolonged dizziness and weakness.  Reviewed symptoms since returning home today. Reports continued weakness and dizziness. BP was 106/66. Heartrate-65. Reports experiencing chest discomfort shortly after returning home. Reports chest pain was relieved after taking nitroglycerin. Reports a headache which he attributes to common after taking nitroglycerin. Denies palpitations. Denies shortness of breath. Reports not attempting to ambulate without assistance. Notes using his walker and having bedside commode available. Reports pending receipt of medications. He messaged PCP prior to call. Reviewed plan and upcoming appointments. He has been contacted by [facility]. Reports the team will be resuming home visits next week. He is scheduled for follow up with Lawrence [name]/Cardiology on 09/15/23. Thorough discussion regarding safety, fall prevention measures, and worsening symptoms that require emergent medical attention. Updated 09/17/23: Lawrence [name] was evaluated for chest pain at [facility] on 09/11/23. He reports feeling well today. Continues to experience fatigue. Completed evaluation with Lawrence [name]/Cardiology on 09/15/23 as scheduled. Reviewed instructions regarding starting carvedilol. Reports not starting this medication. Per dispense report, it was obtained on  09/15/23. He will have his grandson check his prescriptions and start taking twice a day as prescribed. He is aware that the Cardiology Rehab team will contact him regarding start of therapy. Thorough discussion regarding worsening symptoms that require immediate medical attention.    Self-Care Activities/Patient Goals: Take all medications as prescribed Keep scheduled appointments with the Lawrence [name]/Cardiology on 09/15/23 Monitor weight and record readings Notify provider for weight gain outside of established parameters Limit sodium intake Follow recommended safety and fall prevention measures Monitor symptoms and seek immediate medical attention if symptoms worsen          PLAN Will follow up next week   Katina [facility]  [facility], [facility] RN Care Manager Direct Dial: [phone] Website: [email or web address]

## 2023-09-22 DIAGNOSIS — J449 Chronic obstructive pulmonary disease, unspecified: Secondary | ICD-10-CM | POA: Diagnosis not present

## 2023-09-22 DIAGNOSIS — E119 Type 2 diabetes mellitus without complications: Secondary | ICD-10-CM | POA: Diagnosis not present

## 2023-09-22 DIAGNOSIS — I5042 Chronic combined systolic (congestive) and diastolic (congestive) heart failure: Secondary | ICD-10-CM | POA: Diagnosis not present

## 2023-09-22 DIAGNOSIS — I25110 Atherosclerotic heart disease of native coronary artery with unstable angina pectoris: Secondary | ICD-10-CM | POA: Diagnosis not present

## 2023-09-22 DIAGNOSIS — I21A1 Myocardial infarction type 2: Secondary | ICD-10-CM | POA: Diagnosis not present

## 2023-09-22 DIAGNOSIS — K219 Gastro-esophageal reflux disease without esophagitis: Secondary | ICD-10-CM | POA: Diagnosis not present

## 2023-09-22 DIAGNOSIS — I110 Hypertensive heart disease with heart failure: Secondary | ICD-10-CM | POA: Diagnosis not present

## 2023-09-22 DIAGNOSIS — I255 Ischemic cardiomyopathy: Secondary | ICD-10-CM | POA: Diagnosis not present

## 2023-09-22 DIAGNOSIS — I480 Paroxysmal atrial fibrillation: Secondary | ICD-10-CM | POA: Diagnosis not present

## 2023-09-23 ENCOUNTER — Other Ambulatory Visit: Payer: Self-pay

## 2023-09-23 DIAGNOSIS — I255 Ischemic cardiomyopathy: Secondary | ICD-10-CM | POA: Diagnosis not present

## 2023-09-23 DIAGNOSIS — S0990XA Unspecified injury of head, initial encounter: Secondary | ICD-10-CM | POA: Diagnosis not present

## 2023-09-23 DIAGNOSIS — M542 Cervicalgia: Secondary | ICD-10-CM | POA: Insufficient documentation

## 2023-09-23 DIAGNOSIS — I480 Paroxysmal atrial fibrillation: Secondary | ICD-10-CM | POA: Insufficient documentation

## 2023-09-23 DIAGNOSIS — M25552 Pain in left hip: Secondary | ICD-10-CM | POA: Diagnosis not present

## 2023-09-23 DIAGNOSIS — M160 Bilateral primary osteoarthritis of hip: Secondary | ICD-10-CM | POA: Diagnosis not present

## 2023-09-23 DIAGNOSIS — I25110 Atherosclerotic heart disease of native coronary artery with unstable angina pectoris: Secondary | ICD-10-CM | POA: Diagnosis not present

## 2023-09-23 DIAGNOSIS — I6782 Cerebral ischemia: Secondary | ICD-10-CM | POA: Diagnosis not present

## 2023-09-23 DIAGNOSIS — M5030 Other cervical disc degeneration, unspecified cervical region: Secondary | ICD-10-CM | POA: Diagnosis not present

## 2023-09-23 DIAGNOSIS — I10 Essential (primary) hypertension: Secondary | ICD-10-CM | POA: Diagnosis not present

## 2023-09-23 DIAGNOSIS — W010XXA Fall on same level from slipping, tripping and stumbling without subsequent striking against object, initial encounter: Secondary | ICD-10-CM | POA: Diagnosis not present

## 2023-09-23 DIAGNOSIS — J449 Chronic obstructive pulmonary disease, unspecified: Secondary | ICD-10-CM | POA: Diagnosis not present

## 2023-09-23 DIAGNOSIS — I21A1 Myocardial infarction type 2: Secondary | ICD-10-CM | POA: Diagnosis not present

## 2023-09-23 DIAGNOSIS — Y92002 Bathroom of unspecified non-institutional (private) residence single-family (private) house as the place of occurrence of the external cause: Secondary | ICD-10-CM | POA: Insufficient documentation

## 2023-09-23 DIAGNOSIS — I5042 Chronic combined systolic (congestive) and diastolic (congestive) heart failure: Secondary | ICD-10-CM | POA: Diagnosis not present

## 2023-09-23 DIAGNOSIS — K219 Gastro-esophageal reflux disease without esophagitis: Secondary | ICD-10-CM | POA: Diagnosis not present

## 2023-09-23 DIAGNOSIS — M8580 Other specified disorders of bone density and structure, unspecified site: Secondary | ICD-10-CM | POA: Diagnosis not present

## 2023-09-23 DIAGNOSIS — Z7901 Long term (current) use of anticoagulants: Secondary | ICD-10-CM | POA: Diagnosis not present

## 2023-09-23 DIAGNOSIS — R42 Dizziness and giddiness: Secondary | ICD-10-CM | POA: Diagnosis not present

## 2023-09-23 DIAGNOSIS — E119 Type 2 diabetes mellitus without complications: Secondary | ICD-10-CM | POA: Insufficient documentation

## 2023-09-23 DIAGNOSIS — R55 Syncope and collapse: Secondary | ICD-10-CM | POA: Insufficient documentation

## 2023-09-23 DIAGNOSIS — I110 Hypertensive heart disease with heart failure: Secondary | ICD-10-CM | POA: Diagnosis not present

## 2023-09-23 DIAGNOSIS — I509 Heart failure, unspecified: Secondary | ICD-10-CM | POA: Insufficient documentation

## 2023-09-23 DIAGNOSIS — I6529 Occlusion and stenosis of unspecified carotid artery: Secondary | ICD-10-CM | POA: Diagnosis not present

## 2023-09-23 DIAGNOSIS — J439 Emphysema, unspecified: Secondary | ICD-10-CM | POA: Diagnosis not present

## 2023-09-23 DIAGNOSIS — W19XXXA Unspecified fall, initial encounter: Secondary | ICD-10-CM | POA: Diagnosis not present

## 2023-09-23 NOTE — Patient Outreach (Signed)
Care Management   Visit Note  09/23/2023 Name: Lawrence [name] MRN: [id number] DOB: 1956-03-04  Subjective: Lawrence [name] is a 67 y.o. year old male who is a primary care patient of [name], Demetrios Isaacs, MD. The Care Management team was consulted for assistance.      Engaged with patient via telephone  Assessment:  Outpatient Encounter Medications as of 09/23/2023  Medication Sig Note   carvedilol (COREG) 3.125 MG tablet Take 3.125 mg by mouth 2 (two) times daily with a meal.    Accu-Chek Softclix Lancets lancets Use as instructed to check sugar daily for type 2 diabetes.    allopurinol (ZYLOPRIM) 300 MG tablet TAKE 1 TABLET BY MOUTH TWICE A DAY    ALPRAZolam (XANAX) 1 MG tablet Take 1 tablet (1 mg total) by mouth 3 (three) times daily as needed.    amLODipine (NORVASC) 5 MG tablet Take 1 tablet (5 mg total) by mouth daily.    atorvastatin (LIPITOR) 80 MG tablet TAKE 1 TABLET BY MOUTH AT BEDTIME    Blood Glucose Calibration (ACCU-CHEK GUIDE CONTROL) LIQD Use with blood glucose monitor as directed    Blood Glucose Monitoring Suppl (ACCU-CHEK GUIDE) w/Device KIT Use to check blood sugars as directed    Budeson-Glycopyrrol-Formoterol (BREZTRI AEROSPHERE) 160-9-4.8 MCG/ACT AERO Inhale 2 puffs into the lungs 2 (two) times daily.    bumetanide (BUMEX) 2 MG tablet Take 2 mg by mouth daily. As needed for swelling for 7 days (Patient not taking: Reported on 09/06/2023)    cetirizine (ZYRTEC) 10 MG tablet TAKE 1 TABLET BY MOUTH AT BEDTIME    clonazePAM (KLONOPIN) 0.5 MG tablet Take 0.5 mg by mouth 2 (two) times daily.    clopidogrel (PLAVIX) 75 MG tablet TAKE 1 TABLET BY MOUTH DAILY (Patient not taking: Reported on 09/03/2023)    Cyanocobalamin (VITAMIN B-12) 1000 MCG SUBL Place 1 tablet under the tongue daily.    dicyclomine (BENTYL) 10 MG capsule Take 1 capsule (10 mg total) by mouth 3 (three) times daily as needed (abdominal pain).    ELIQUIS 5 MG TABS tablet TAKE 1 TABLET BY MOUTH TWICE A DAY     FARXIGA 10 MG TABS tablet Take 10 mg by mouth daily.    glipiZIDE (GLUCOTROL) 5 MG tablet Take 1 tablet (5 mg total) by mouth daily as needed (sugar over 300). (Patient not taking: Reported on 08/23/2023)    glucose blood (ACCU-CHEK GUIDE) test strip Use as instructed to check sugar daily for type 2 diabetes.    iron polysaccharides (FERREX 150) 150 MG capsule TAKE 1 CAPSULE BY MOUTH DAILY    isosorbide mononitrate (IMDUR) 120 MG 24 hr tablet TAKE 1 TABLET BY MOUTH DAILY    lidocaine (LIDODERM) 5 % Place 1 patch onto the skin daily. Remove & Discard patch within 12 hours or as directed by MD    lisinopril (ZESTRIL) 20 MG tablet Take 20 mg by mouth daily.    metFORMIN (GLUCOPHAGE-XR) 500 MG 24 hr tablet Take 1 tablet (500 mg total) by mouth 2 (two) times daily. (Patient taking differently: Take 500 mg by mouth daily.) 09/06/2023: Taking differently. Reports taking once tablet daily   methocarbamol (ROBAXIN) 500 MG tablet TAKE 1 TABLET BY MOUTH EVERY 8 HOURS AS NEEDED FOR MUSCLE SPASMS 08/26/2023: Up to 4x a day   metoprolol succinate (TOPROL-XL) 25 MG 24 hr tablet TAKE 1/2 TABLET (12.5 MG TOTAL) BY MOUTHDAILY    nitroGLYCERIN (NITROSTAT) 0.4 MG SL tablet Place 1 tablet (0.4 mg total)  under the tongue every 5 (five) minutes as needed for chest pain.    omega-3 acid ethyl esters (LOVAZA) 1 g capsule TAKE 4 CAPSULES (4 GRAMS TOTAL) BY MOUTHDAILY    oxyCODONE-acetaminophen (PERCOCET) 10-325 MG tablet TAKE 1 TABLET BY MOUTH EVERY 4 HOURS AS NEEDED FOR PAIN    OXYGEN Inhale 2 L into the lungs at bedtime as needed (for shortness of breath).    pantoprazole (PROTONIX) 40 MG tablet Take 1 tablet (40 mg total) by mouth 2 (two) times daily for 14 days. 09/06/2023: Reports currently not taking. Will discuss further during GI follow up on 09/08/23.   ranolazine (RANEXA) 1000 MG SR tablet Take 1 tablet (1,000 mg total) by mouth 2 (two) times daily.    sacubitril-valsartan (ENTRESTO) 24-26 MG Take 1 tablet by mouth 2  (two) times daily. (Patient not taking: Reported on 08/26/2023)    spironolactone (ALDACTONE) 25 MG tablet Take 1 tablet by mouth daily.    traZODone (DESYREL) 150 MG tablet TAKE 1 TABLET BY MOUTH AT BEDTIME (Patient taking differently: Take 100 mg by mouth at bedtime.)    venlafaxine XR (EFFEXOR-XR) 75 MG 24 hr capsule Take 75 mg by mouth daily with breakfast.    No facility-administered encounter medications on file as of 09/23/2023.    Interventions:   Goals Addressed             This Visit's Progress    Care Management       Current Barriers:  Care Management support and education needs related to Multiple Chronic Diseases Requires additional Care Giver support in the home  Planned Interventions: Patient was recently hospitalized at [facility]. Heart Catheterization was performed.  Reviewed medications and symptoms since discharge. Reports doing well today. Confirmed grandson [name] is available to assist in the home. Reviewed Cardiologist instructions to take Bumex for 7 days and start spironolactone 25 mg daily. Reports taking as prescribed. Reports BP is being monitored by [facility] staff and readings have been within range. Denies chest pain, palpitations, dizziness or headaches since discharge. Reviewed weight parameters. Reports weighing as advised. Reports weight today was 182 lbs. Verbalized need to notify the Cardiology team for weight gain greater than 2 lbs overnight or 5 lbs in a week. Reports minimal swelling to lower extremities. He notes this is an significant improvement. Continues to experience shortness of breath and fatigue with minimal exertion but feels that he is at his baseline. Reports not requiring supplemental oxygen during the day since hospital discharge. Currently using 2L/min at night. Reviewed nutritional needs. Reports pending meal delivery. Reports he initially thought the meals would be delivered by [facility] however meals will be delivered from a  different agency. Reports receiving a home visit today from the local agency and being informed that the meals were delayed. Unable to recall the name of the agency. Anticipates start of meal delivery within the next two weeks. Reports grandson [name] is currently available to assist with meal preparation if needed. Thorough discussion regarding worsening s/sx  and indications for seeking immediate medical attention. Update 08/25/23 Provided update regarding discussion with [employer]. Staff aware that Mr. Gala will require in-home assistance. His grandson Kavion Hottinger will be staying at the residence to assist. Franklyn Lor will resend the form for [employer]. Will forward to PCP for review and signature once form is received.  Thorough discussion regarding worsening s/sx related to CHF exacerbation and indications for seeking immediate medical attention. Updated 09/03/23: Mr. Hlavac confirmed receiving  second delivery of meals on 09/02/23. [employer] forms and letter forwarded. PCP to review and sign during the clinic visit today. Mr. Cordia agreed to hand delivery forms to [facility] following his clinic visit. Update 09/06/23: Reports doing well today. Reports weight has remained stable. Reports some episodes of palpitations over the weekend. Reports decreased activity over the weekend d/t increased hip pain. Reports the pain is currently being controlled with oxycodone and tylenol. He is pending a call from the Home Health team regarding PT visit later today. Reports minimal swelling to his lower extremities. Denies abdominal swelling. Continues to experience dyspnea with activity. Denies symptoms at rest. Advised to continue using supplemental oxygen as needed. Reports currently only requiring at night with CPAP. Confirmed receipt of two week supply of frozen meals.  Confirmed Investment banker, corporate received the  documents required for his grandson [name] to remain in the apartment and serve as a caregiver.  Completed conference call with the [facility] Cardiology scheduling staff today. Mr. Inlow reports two appointments with Cardiology-on 10/23 and 10/30. Staff reported a scheduling conflict on 09/08/23. He will be evaluated by Dr. Nedra Hai on 09/15/23 at the [facility] office. Mr. Hemme confirmed that his grandson will be available. Declined need for transportation assistance. Reviewed worsening s/sx that require immediate medical attention. Update 09/10/23: Mr. Guadagnino was evaluated and treated in the [facility] Room due to lightheadedness. Reports contacting 911 after experiencing prolonged dizziness and weakness.  Reviewed symptoms since returning home today. Reports continued weakness and dizziness. BP was 106/66. Heartrate-65. Reports experiencing chest discomfort shortly after returning home. Reports chest pain was relieved after taking nitroglycerin. Reports a headache which he attributes to common after taking nitroglycerin. Denies palpitations. Denies shortness of breath. Reports not attempting to ambulate without assistance. Notes using his walker and having bedside commode available. Reports pending receipt of medications. He messaged PCP prior to call. Reviewed plan and upcoming appointments. He has been contacted by [facility]. Reports the team will be resuming home visits next week. He is scheduled for follow up with Dr. Lee/Cardiology on 09/15/23. Thorough discussion regarding safety, fall prevention measures, and worsening symptoms that require emergent medical attention. Updated 09/17/23: Mr. Querry was evaluated for chest pain at [facility] on 09/11/23. He reports feeling well today. Continues to experience fatigue. Completed evaluation with Dr. Lee/Cardiology on 09/15/23 as scheduled. Reviewed instructions regarding starting carvedilol. Reports not starting this medication. Per dispense report, it  was obtained on 09/15/23. He will have his grandson check his prescriptions and start taking twice a day as prescribed. He is aware that the Cardiology Rehab team will contact him regarding start of therapy. Thorough discussion regarding worsening symptoms that require immediate medical attention. Update 09/24/23: Mr. Greiss was evaluated in the Emergency Department for chest pain on 09/18/23. Reports feeling "ok" today. Denies having to use nitroglycerin in approximately a day or so. His mobility remains limited. Denies falls. Reports his activity tolerance has not declined since his last ED visit. Reports still using supplemental oxygen primarily at night. Reviewed medications. Reports he is taking carvedilol as prescribed.  Discussed BP and weights. Reports weighing as advised. Weight-183 lbs. Denies increased abdominal or lower extremity edema. He has not checked his blood pressure. Bps were previously being monitored by the [facility] team. Reports completing a home health visit on Tuesday and being informed that they would not be returning. Placed call to [facility] today. Team confirmed that Tuesday was the last Occupational Therapy  visit. Anticipates the Physical Therapy closing out his case next week. Reviewed indications for seeking immediate medical attention. He is aware of options for Urgent Care or the [facility] in clinic if he requires non emergent evaluation after clinic hours.    Self-Care Activities/Patient Goals: Take all medications as prescribed Keep scheduled appointments with the Dr. Lee/Cardiology on 09/15/23 Monitor weight and record readings Notify provider for weight gain outside of established parameters Limit sodium intake Follow recommended safety and fall prevention measures Monitor symptoms and seek immediate medical attention if symptoms worsen          PLAN Will follow up next week   [facility]  [facility], [facility] RN Care Manager Direct Dial: [phone] Website: [email or web address]

## 2023-09-23 NOTE — ED Triage Notes (Signed)
Patient brought in by [facility] from home tonight due to syncopal episode that caused patient to fall. Patient fell onto wood floor. Denies hitting head but is on eliquis. Complaints of left hip and side pain. Patient is awake, alert, and oriented during triage.

## 2023-09-24 ENCOUNTER — Other Ambulatory Visit: Payer: Self-pay

## 2023-09-24 ENCOUNTER — Emergency Department: Payer: Medicare HMO

## 2023-09-24 ENCOUNTER — Emergency Department
Admission: EM | Admit: 2023-09-24 | Discharge: 2023-09-24 | Disposition: A | Discharge status: Home / Self Care | Payer: Medicare HMO | Attending: Emergency Medicine | Admitting: Emergency Medicine

## 2023-09-24 ENCOUNTER — Ambulatory Visit: Payer: Medicare HMO | Admitting: Family Medicine

## 2023-09-24 DIAGNOSIS — M160 Bilateral primary osteoarthritis of hip: Secondary | ICD-10-CM | POA: Diagnosis not present

## 2023-09-24 DIAGNOSIS — M8580 Other specified disorders of bone density and structure, unspecified site: Secondary | ICD-10-CM | POA: Diagnosis not present

## 2023-09-24 DIAGNOSIS — S0990XA Unspecified injury of head, initial encounter: Secondary | ICD-10-CM | POA: Diagnosis not present

## 2023-09-24 DIAGNOSIS — R55 Syncope and collapse: Secondary | ICD-10-CM

## 2023-09-24 DIAGNOSIS — M5030 Other cervical disc degeneration, unspecified cervical region: Secondary | ICD-10-CM | POA: Diagnosis not present

## 2023-09-24 DIAGNOSIS — I6782 Cerebral ischemia: Secondary | ICD-10-CM | POA: Diagnosis not present

## 2023-09-24 DIAGNOSIS — M25552 Pain in left hip: Secondary | ICD-10-CM | POA: Diagnosis not present

## 2023-09-24 DIAGNOSIS — I6529 Occlusion and stenosis of unspecified carotid artery: Secondary | ICD-10-CM | POA: Diagnosis not present

## 2023-09-24 DIAGNOSIS — J439 Emphysema, unspecified: Secondary | ICD-10-CM | POA: Diagnosis not present

## 2023-09-24 LAB — CBC
HCT: 50.8 % (ref 39.0–52.0)
Hemoglobin: 16.4 g/dL (ref 13.0–17.0)
MCH: 29.2 pg (ref 26.0–34.0)
MCHC: 32.3 g/dL (ref 30.0–36.0)
MCV: 90.4 fL (ref 80.0–100.0)
Platelets: 153 10*3/uL (ref 150–400)
RBC: 5.62 MIL/uL (ref 4.22–5.81)
RDW: 15 % (ref 11.5–15.5)
WBC: 6.4 10*3/uL (ref 4.0–10.5)
nRBC: 0 % (ref 0.0–0.2)

## 2023-09-24 LAB — URINALYSIS, ROUTINE W REFLEX MICROSCOPIC
Bacteria, UA: NONE SEEN
Bilirubin Urine: NEGATIVE
Glucose, UA: >=500 mg/dL — AB
Hgb urine dipstick: NEGATIVE
Ketones, ur: NEGATIVE mg/dL
Leukocytes,Ua: NEGATIVE
Nitrite: NEGATIVE
Protein, ur: NEGATIVE mg/dL
Specific Gravity, Urine: 1.012 (ref 1.005–1.030)
pH: 6 (ref 5.0–8.0)

## 2023-09-24 LAB — TROPONIN I (HIGH SENSITIVITY)
Troponin I (High Sensitivity): 20 ng/L — ABNORMAL HIGH (ref <18)
Troponin I (High Sensitivity): 20 ng/L — ABNORMAL HIGH (ref <18)

## 2023-09-24 LAB — BASIC METABOLIC PANEL
Anion gap: 9 (ref 5–15)
BUN: 11 mg/dL (ref 8–23)
CO2: 29 mmol/L (ref 22–32)
Calcium: 9.4 mg/dL (ref 8.9–10.3)
Chloride: 101 mmol/L (ref 98–111)
Creatinine, Ser: 1.02 mg/dL (ref 0.61–1.24)
GFR, Estimated: >60 mL/min (ref >60)
Glucose, Bld: 100 mg/dL — ABNORMAL HIGH (ref 70–99)
Potassium: 3.6 mmol/L (ref 3.5–5.1)
Sodium: 139 mmol/L (ref 135–145)

## 2023-09-24 LAB — PROTIME-INR
INR: 1.2 (ref 0.8–1.2)
Prothrombin Time: 15.6 s — ABNORMAL HIGH (ref 11.4–15.2)

## 2023-09-24 LAB — CBG MONITORING, ED: Glucose-Capillary: 103 mg/dL — ABNORMAL HIGH (ref 70–99)

## 2023-09-24 MED ORDER — MORPHINE SULFATE (PF) 4 MG/ML IV SOLN
4.0000 mg | Freq: Once | INTRAVENOUS | Status: AC
  Administered 2023-09-24: 4 mg via INTRAVENOUS
  Filled 2023-09-24: qty 1

## 2023-09-24 MED ORDER — KETOROLAC TROMETHAMINE 15 MG/ML IJ SOLN
15.0000 mg | Freq: Once | INTRAMUSCULAR | Status: AC
  Administered 2023-09-24: 15 mg via INTRAVENOUS
  Filled 2023-09-24: qty 1

## 2023-09-24 NOTE — ED Notes (Signed)
Spoke with the Diplomatic Services operational officer, transport placed

## 2023-09-24 NOTE — ED Notes (Signed)
Pt decided he did not want to wait for transport and asked for a taxi to be called. Pt is on 2L [place] baseline. Pt stated he does not have his portable oxygen with him but he will be fine on RA for the taxi ride. Placed Pt in wheelchair with 2L [place] in waiting room awaiting taxi arrival. VSS GCS 15

## 2023-09-24 NOTE — ED Provider Notes (Signed)
[facility] Provider Note    Event Date/Time   First MD Initiated Contact with Patient 09/24/23 [phone]     (approximate)   History   Chief Complaint: Loss of Consciousness and Fall   HPI  Lawrence [name] is a 67 y.o. male with a history of CHF, paroxysmal atrial fibrillation, hypertension, diabetes who is brought to the ED due to a fall at home.  Patient reports that he went to the bathroom, and on standing up from the toilet got lightheaded, vision darkened, passed out to the floor.  Does not think he hit his head, but does complain of pain in the neck and in the left hip.  He is on Eliquis.  No chest pain or shortness of breath.  No preceding symptoms.  Denies significant headache chest pain shortness of breath.          Physical Exam   Triage Vital Signs: ED Triage Vitals  Encounter Vitals Group     BP 09/24/23 0002 (!) 157/110     Systolic BP Percentile --      Diastolic BP Percentile --      Pulse Rate 09/24/23 0002 73     Resp 09/24/23 0002 18     Temp 09/24/23 0002 97.8 F (36.6 C)     Temp Source 09/24/23 0002 Oral     SpO2 09/24/23 0002 100 %     Weight 09/24/23 0000 182 lb 15.7 oz (83 kg)     Height 09/24/23 0000 5\' 7"  (1.702 m)     Head Circumference --      Peak Flow --      Pain Score 09/24/23 0000 10     Pain Loc --      Pain Education --      Exclude from Growth Chart --     Most recent vital signs: Vitals:   09/24/23 0515 09/24/23 0530  BP:  (!) 177/103  Pulse: 64   Resp: 15   Temp:    SpO2:  100%    General: Awake, no distress.  CV:  Good peripheral perfusion.  Irregular rhythm, rate of about 70.  Normal distal pulses Resp:  Normal effort.  Clear to auscultation bilaterally Abd:  No distention.  Soft nontender Other:  Mild midline C-spine tenderness.  Tenderness at the left hip greater trochanter, pain with passive hip flexion.  No deformity or limb shortening   ED Results / Procedures / Treatments    Labs (all labs ordered are listed, but only abnormal results are displayed) Labs Reviewed  BASIC METABOLIC PANEL - Abnormal; Notable for the following components:      Result Value   Glucose, Bld 100 (*)    All other components within normal limits  URINALYSIS, ROUTINE W REFLEX MICROSCOPIC - Abnormal; Notable for the following components:   Color, Urine YELLOW (*)    APPearance CLEAR (*)    Glucose, UA >=500 (*)    All other components within normal limits  PROTIME-INR - Abnormal; Notable for the following components:   Prothrombin Time 15.6 (*)    All other components within normal limits  CBG MONITORING, ED - Abnormal; Notable for the following components:   Glucose-Capillary 103 (*)    All other components within normal limits  TROPONIN I (HIGH SENSITIVITY) - Abnormal; Notable for the following components:   Troponin I (High Sensitivity) 20 (*)    All other components within normal limits  TROPONIN I (HIGH SENSITIVITY) - Abnormal;  Notable for the following components:   Troponin I (High Sensitivity) 20 (*)    All other components within normal limits  CBC     EKG Interpreted by me Atrial fibrillation rate of 73.  Normal axis and intervals.  Poor R wave progression.  No acute ischemic changes.   RADIOLOGY X-ray left hip interpreted by me, negative for fracture. CT head, cervical spine unremarkable. CT left hip negative   PROCEDURES:  Procedures   MEDICATIONS ORDERED IN ED: Medications  ketorolac (TORADOL) 15 MG/ML injection 15 mg (15 mg Intravenous Given 09/24/23 0402)  morphine (PF) 4 MG/ML injection 4 mg (4 mg Intravenous Given 09/24/23 0525)     IMPRESSION / MDM / ASSESSMENT AND PLAN / ED COURSE  I reviewed the triage vital signs and the nursing notes.  DDx: Intracranial hemorrhage, C-spine fracture, hip fracture, dehydration, electrolyte abnormality, anemia, UTI  Patient's presentation is most consistent with acute presentation with potential threat to  life or bodily function.  Patient presents with fall due to syncope, most likely orthostatic or possibly vagal.  Doubt underlying neurologic cardiac or circulatory cause.  Currently asymptomatic other than pain from his blunt trauma.  Exam is reassuring.  Vitals, labs, imaging all unremarkable.  Stable for discharge.       FINAL CLINICAL IMPRESSION(S) / ED DIAGNOSES   Final diagnoses:  Vaso vagal episode     Rx / DC Orders   ED Discharge Orders     None        Note:  This document was prepared using Dragon voice recognition software and may include unintentional dictation errors.   Sharman Cheek, MD 09/24/23 [phone]

## 2023-09-25 DIAGNOSIS — I4891 Unspecified atrial fibrillation: Secondary | ICD-10-CM | POA: Diagnosis not present

## 2023-09-25 DIAGNOSIS — R110 Nausea: Secondary | ICD-10-CM | POA: Diagnosis not present

## 2023-09-26 DIAGNOSIS — R079 Chest pain, unspecified: Secondary | ICD-10-CM | POA: Diagnosis not present

## 2023-09-27 ENCOUNTER — Other Ambulatory Visit: Payer: Medicare HMO | Admitting: Pharmacist

## 2023-09-27 DIAGNOSIS — I21A1 Myocardial infarction type 2: Secondary | ICD-10-CM | POA: Diagnosis not present

## 2023-09-27 DIAGNOSIS — J449 Chronic obstructive pulmonary disease, unspecified: Secondary | ICD-10-CM | POA: Diagnosis not present

## 2023-09-27 DIAGNOSIS — I480 Paroxysmal atrial fibrillation: Secondary | ICD-10-CM | POA: Diagnosis not present

## 2023-09-27 DIAGNOSIS — I255 Ischemic cardiomyopathy: Secondary | ICD-10-CM | POA: Diagnosis not present

## 2023-09-27 DIAGNOSIS — E119 Type 2 diabetes mellitus without complications: Secondary | ICD-10-CM | POA: Diagnosis not present

## 2023-09-27 DIAGNOSIS — I214 Non-ST elevation (NSTEMI) myocardial infarction: Secondary | ICD-10-CM

## 2023-09-27 DIAGNOSIS — I5042 Chronic combined systolic (congestive) and diastolic (congestive) heart failure: Secondary | ICD-10-CM | POA: Diagnosis not present

## 2023-09-27 DIAGNOSIS — I110 Hypertensive heart disease with heart failure: Secondary | ICD-10-CM | POA: Diagnosis not present

## 2023-09-27 DIAGNOSIS — K219 Gastro-esophageal reflux disease without esophagitis: Secondary | ICD-10-CM | POA: Diagnosis not present

## 2023-09-27 DIAGNOSIS — I25110 Atherosclerotic heart disease of native coronary artery with unstable angina pectoris: Secondary | ICD-10-CM | POA: Diagnosis not present

## 2023-09-27 NOTE — Progress Notes (Signed)
09/27/2023 Name: Lawrence [name] MRN: [id number] DOB: Oct 01, 1956  Chief Complaint  Patient presents with   Medication Management    Lawrence [name] is a 67 y.o. year old male who presented for a telephone visit.   They were referred to the pharmacist by their PCP for assistance in managing medication access.    Subjective:  Care Team: Primary Care Provider: Malva Limes, MD ; Next Scheduled Visit: 09/03/23 Clinical Pharmacist: Lawrence [name], PharmD  Medication Access/Adherence  Current Pharmacy:  [facility] - [place], [place] - [address] [place] [address] Phone: [phone] Fax: [phone]  [facility] - [place], [place] - [address] [place] [address] Phone: [phone] Fax: [phone]  [id number] - [place], [place] - [address] AT [facility] [address] [place] [address] Phone: [phone] Fax: [phone]   Patient reports affordability concerns with their medications: No  Patient reports access/transportation concerns to their pharmacy: No  Patient reports adherence concerns with their medications:  No    *Patient has experienced several hospital discharges lately   Heart Failure:  Current medications:  ACEi/ARB/ARNI: Lisinopril 20mg  (D/C notes report Entresto 24-26mg ) SGLT2i: Farxiga 10mg  Beta blocker: Carvedilol 3.125mg  twice a day (switched from Toprol XL 25mg ; tartrate in 2023) Mineralocorticoid Receptor Antagonist: Spironolactone 25mg   Diuretic regimen: None (Lasix, Bumex, Metolazone prior) Amiodarone and Sotalol were used for rate control previously  Current home blood pressure readings: Unable to get today  Patient denies volume overload signs or symptoms including shortness of breath, lower extremity edema, increased use of elevated pillows at night  Current medication access support: [employer] D-SNP    COPD:  Current medications: Breztri 2 puffs twice a day; rinses mouth out Medications tried in the past: Albuterol on allergy list (palpitations); Xopenex (ineffective)  Reports chronic shortness of breath, and chest pain that comes and goes   *DM, HLD, and HTN well controlled currently  Objective:  Lab Results  Component Value Date   HGBA1C 6.1 (H) 08/05/2023    Lab Results  Component Value Date   CREATININE 1.02 09/24/2023   BUN 11 09/24/2023   NA 139 09/24/2023   K 3.6 09/24/2023   CL 101 09/24/2023   CO2 29 09/24/2023    Lab Results  Component Value Date   CHOL 105 08/08/2023   HDL 43 08/08/2023   LDLCALC 31 08/08/2023   LDLDIRECT 122.1 (H) 09/27/2020   TRIG 155 (H) 08/08/2023   CHOLHDL 2.4 08/08/2023    Medications Reviewed Today   Medications were not reviewed in this encounter       Assessment/Plan:   Heart Failure: - Currently appropriately managed - Reviewed appropriate blood pressure monitoring technique and reviewed goal blood pressure - Reviewed to weigh daily and when to contact cardiology with weight gain - Reviewed dietary modifications including limiting salty foods - Recommend to check daily weights and continue monitoring swelling  - Carvedilol is causing severe dizziness currently- caused ED visit for fall on 11/8; counseled to wait a minute when standing and use walker when able - Stopped diuretic as recommended and waiting for cardiac rehab center to call - Staying hydrated with about 5 large glasses of water each day    COPD: - Currently controlled.  - Reviewed appropriate inhaler technique. - Recommend to get new nebulizer as recommended  - Need to get nebulizer and new  O2 tank potentially (mentioned within chart notes)   **Follow Up Plan:**  - Tried calling daughter-in-law for full medication review as she puts them in a weekly pillbox (unable to leave a voicemail) - Today was PT's last visit- advised that patient will  need assessed by Lawrence [name] for continuation of services - Appears patient will need a refill of Eliquis, Imdur, and Lisinopril at the pharmacy - Advised to discuss pain concerns with Lawrence [name] due pain of 9 out of 10 recently  **Of note, called Cardiology about Entresto vs Lisinopril concerns- not on his med list (will need a refill if continuing the Lisinopril)- waiting on call back   - Unable to refill Pantoprazole as patient did not attend 09/08/23 GI visit  Lawrence [name], PharmD [facility] Phone Number: [phone]

## 2023-09-28 ENCOUNTER — Inpatient Hospital Stay
Admission: EM | Admit: 2023-09-28 | Discharge: 2023-09-30 | DRG: 683 | Disposition: A | Discharge status: Home / Self Care | Payer: Medicare HMO | Attending: Internal Medicine | Admitting: Internal Medicine

## 2023-09-28 ENCOUNTER — Encounter: Payer: Self-pay | Admitting: Emergency Medicine

## 2023-09-28 ENCOUNTER — Emergency Department: Payer: Medicare HMO

## 2023-09-28 ENCOUNTER — Other Ambulatory Visit: Payer: Self-pay

## 2023-09-28 DIAGNOSIS — N179 Acute kidney failure, unspecified: Secondary | ICD-10-CM | POA: Diagnosis not present

## 2023-09-28 DIAGNOSIS — E860 Dehydration: Secondary | ICD-10-CM | POA: Diagnosis not present

## 2023-09-28 DIAGNOSIS — I5042 Chronic combined systolic (congestive) and diastolic (congestive) heart failure: Secondary | ICD-10-CM | POA: Diagnosis not present

## 2023-09-28 DIAGNOSIS — Z882 Allergy status to sulfonamides status: Secondary | ICD-10-CM

## 2023-09-28 DIAGNOSIS — M109 Gout, unspecified: Secondary | ICD-10-CM | POA: Diagnosis present

## 2023-09-28 DIAGNOSIS — Z7984 Long term (current) use of oral hypoglycemic drugs: Secondary | ICD-10-CM

## 2023-09-28 DIAGNOSIS — J4489 Other specified chronic obstructive pulmonary disease: Secondary | ICD-10-CM | POA: Diagnosis present

## 2023-09-28 DIAGNOSIS — I2510 Atherosclerotic heart disease of native coronary artery without angina pectoris: Secondary | ICD-10-CM | POA: Insufficient documentation

## 2023-09-28 DIAGNOSIS — R55 Syncope and collapse: Secondary | ICD-10-CM | POA: Diagnosis present

## 2023-09-28 DIAGNOSIS — Z8249 Family history of ischemic heart disease and other diseases of the circulatory system: Secondary | ICD-10-CM

## 2023-09-28 DIAGNOSIS — F419 Anxiety disorder, unspecified: Secondary | ICD-10-CM | POA: Insufficient documentation

## 2023-09-28 DIAGNOSIS — E119 Type 2 diabetes mellitus without complications: Secondary | ICD-10-CM

## 2023-09-28 DIAGNOSIS — Z85828 Personal history of other malignant neoplasm of skin: Secondary | ICD-10-CM

## 2023-09-28 DIAGNOSIS — K219 Gastro-esophageal reflux disease without esophagitis: Secondary | ICD-10-CM | POA: Diagnosis present

## 2023-09-28 DIAGNOSIS — Z87891 Personal history of nicotine dependence: Secondary | ICD-10-CM

## 2023-09-28 DIAGNOSIS — Z885 Allergy status to narcotic agent status: Secondary | ICD-10-CM

## 2023-09-28 DIAGNOSIS — Z860101 Personal history of adenomatous and serrated colon polyps: Secondary | ICD-10-CM

## 2023-09-28 DIAGNOSIS — G4700 Insomnia, unspecified: Secondary | ICD-10-CM

## 2023-09-28 DIAGNOSIS — Z66 Do not resuscitate: Secondary | ICD-10-CM | POA: Diagnosis present

## 2023-09-28 DIAGNOSIS — Z7901 Long term (current) use of anticoagulants: Secondary | ICD-10-CM

## 2023-09-28 DIAGNOSIS — Z818 Family history of other mental and behavioral disorders: Secondary | ICD-10-CM

## 2023-09-28 DIAGNOSIS — K900 Celiac disease: Secondary | ICD-10-CM | POA: Diagnosis present

## 2023-09-28 DIAGNOSIS — I959 Hypotension, unspecified: Secondary | ICD-10-CM | POA: Diagnosis present

## 2023-09-28 DIAGNOSIS — G8929 Other chronic pain: Secondary | ICD-10-CM | POA: Diagnosis present

## 2023-09-28 DIAGNOSIS — J9611 Chronic respiratory failure with hypoxia: Secondary | ICD-10-CM | POA: Diagnosis not present

## 2023-09-28 DIAGNOSIS — E785 Hyperlipidemia, unspecified: Secondary | ICD-10-CM | POA: Insufficient documentation

## 2023-09-28 DIAGNOSIS — I480 Paroxysmal atrial fibrillation: Secondary | ICD-10-CM | POA: Diagnosis not present

## 2023-09-28 DIAGNOSIS — Z888 Allergy status to other drugs, medicaments and biological substances status: Secondary | ICD-10-CM

## 2023-09-28 DIAGNOSIS — Z820 Family history of epilepsy and other diseases of the nervous system: Secondary | ICD-10-CM

## 2023-09-28 DIAGNOSIS — R531 Weakness: Principal | ICD-10-CM

## 2023-09-28 DIAGNOSIS — R5381 Other malaise: Secondary | ICD-10-CM | POA: Diagnosis not present

## 2023-09-28 DIAGNOSIS — I10 Essential (primary) hypertension: Secondary | ICD-10-CM | POA: Diagnosis present

## 2023-09-28 DIAGNOSIS — I252 Old myocardial infarction: Secondary | ICD-10-CM

## 2023-09-28 DIAGNOSIS — Z9181 History of falling: Secondary | ICD-10-CM

## 2023-09-28 DIAGNOSIS — I429 Cardiomyopathy, unspecified: Secondary | ICD-10-CM | POA: Diagnosis present

## 2023-09-28 DIAGNOSIS — Z955 Presence of coronary angioplasty implant and graft: Secondary | ICD-10-CM | POA: Diagnosis not present

## 2023-09-28 DIAGNOSIS — Z825 Family history of asthma and other chronic lower respiratory diseases: Secondary | ICD-10-CM

## 2023-09-28 DIAGNOSIS — Z79899 Other long term (current) drug therapy: Secondary | ICD-10-CM

## 2023-09-28 DIAGNOSIS — R072 Precordial pain: Secondary | ICD-10-CM | POA: Diagnosis present

## 2023-09-28 DIAGNOSIS — I482 Chronic atrial fibrillation, unspecified: Secondary | ICD-10-CM | POA: Diagnosis not present

## 2023-09-28 DIAGNOSIS — R42 Dizziness and giddiness: Secondary | ICD-10-CM | POA: Diagnosis not present

## 2023-09-28 DIAGNOSIS — Z833 Family history of diabetes mellitus: Secondary | ICD-10-CM

## 2023-09-28 DIAGNOSIS — I110 Hypertensive heart disease with heart failure: Secondary | ICD-10-CM | POA: Diagnosis present

## 2023-09-28 DIAGNOSIS — Z87442 Personal history of urinary calculi: Secondary | ICD-10-CM

## 2023-09-28 DIAGNOSIS — Z043 Encounter for examination and observation following other accident: Secondary | ICD-10-CM | POA: Diagnosis not present

## 2023-09-28 DIAGNOSIS — Z7951 Long term (current) use of inhaled steroids: Secondary | ICD-10-CM

## 2023-09-28 LAB — CBC WITH DIFFERENTIAL/PLATELET
Abs Immature Granulocytes: 0.1 10*3/uL — ABNORMAL HIGH (ref 0.00–0.07)
Basophils Absolute: 0.1 10*3/uL (ref 0.0–0.1)
Basophils Relative: 1 %
Eosinophils Absolute: 0.1 10*3/uL (ref 0.0–0.5)
Eosinophils Relative: 2 %
HCT: 53.7 % — ABNORMAL HIGH (ref 39.0–52.0)
Hemoglobin: 17.4 g/dL — ABNORMAL HIGH (ref 13.0–17.0)
Immature Granulocytes: 1 %
Lymphocytes Relative: 22 %
Lymphs Abs: 1.9 10*3/uL (ref 0.7–4.0)
MCH: 29.6 pg (ref 26.0–34.0)
MCHC: 32.4 g/dL (ref 30.0–36.0)
MCV: 91.5 fL (ref 80.0–100.0)
Monocytes Absolute: 0.5 10*3/uL (ref 0.1–1.0)
Monocytes Relative: 6 %
Neutro Abs: 5.9 10*3/uL (ref 1.7–7.7)
Neutrophils Relative %: 68 %
Platelets: 218 10*3/uL (ref 150–400)
RBC: 5.87 MIL/uL — ABNORMAL HIGH (ref 4.22–5.81)
RDW: 15.2 % (ref 11.5–15.5)
WBC: 8.7 10*3/uL (ref 4.0–10.5)
nRBC: 0 % (ref 0.0–0.2)

## 2023-09-28 LAB — COMPREHENSIVE METABOLIC PANEL
ALT: 15 U/L (ref 0–44)
AST: 16 U/L (ref 15–41)
Albumin: 4.2 g/dL (ref 3.5–5.0)
Alkaline Phosphatase: 63 U/L (ref 38–126)
Anion gap: 12 (ref 5–15)
BUN: 17 mg/dL (ref 8–23)
CO2: 30 mmol/L (ref 22–32)
Calcium: 9.8 mg/dL (ref 8.9–10.3)
Chloride: 98 mmol/L (ref 98–111)
Creatinine, Ser: 1.95 mg/dL — ABNORMAL HIGH (ref 0.61–1.24)
GFR, Estimated: 37 mL/min — ABNORMAL LOW (ref >60)
Glucose, Bld: 172 mg/dL — ABNORMAL HIGH (ref 70–99)
Potassium: 3.5 mmol/L (ref 3.5–5.1)
Sodium: 140 mmol/L (ref 135–145)
Total Bilirubin: 1.3 mg/dL — ABNORMAL HIGH (ref <1.2)
Total Protein: 7.1 g/dL (ref 6.5–8.1)

## 2023-09-28 LAB — TROPONIN I (HIGH SENSITIVITY): Troponin I (High Sensitivity): 23 ng/L — ABNORMAL HIGH (ref <18)

## 2023-09-28 MED ORDER — SODIUM CHLORIDE 0.9 % IV BOLUS
500.0000 mL | Freq: Once | INTRAVENOUS | Status: AC
  Administered 2023-09-28: 500 mL via INTRAVENOUS

## 2023-09-28 MED ORDER — SODIUM CHLORIDE 0.9 % IV BOLUS
1000.0000 mL | Freq: Once | INTRAVENOUS | Status: AC
  Administered 2023-09-28: 1000 mL via INTRAVENOUS

## 2023-09-28 MED ORDER — FENTANYL CITRATE PF 50 MCG/ML IJ SOSY
50.0000 ug | PREFILLED_SYRINGE | Freq: Once | INTRAMUSCULAR | Status: AC
  Administered 2023-09-28: 50 ug via INTRAVENOUS
  Filled 2023-09-28: qty 1

## 2023-09-28 NOTE — ED Triage Notes (Signed)
Increased dizziness, hypotensive at home, stated started new medication carvedilol and spironolactone

## 2023-09-28 NOTE — ED Provider Notes (Signed)
[facility] Provider Note    Event Date/Time   First MD Initiated Contact with Patient 09/28/23 2134     (approximate)   History   Weakness (hypotension)   HPI Lawrence [name] is a 67 y.o. male with 64 Emergency Department visits in the past 6 months.  History of chronic chest pain, CHF, CAD, DM2.  Presenting today for weakness and lightheadedness.  Patient states he was reportedly started on new medication including carvedilol and spironolactone.  Today he had an episode of lightheadedness and home blood pressure readings were low.  Denies any true syncope.  Also notes new chest pain today but does have a chronic history of chest pain.  Denies any new shortness of breath, abdominal pain, nausea, vomiting, leg pain, leg swelling.  States that he did have a fall within the past 24 hours where he had the front of his chest and his hip.  Has been able to ambulate still.  Extensively reviewed prior ED visits often times with chest pain and weakness as well.     Physical Exam   Triage Vital Signs: ED Triage Vitals  Encounter Vitals Group     BP --      Systolic BP Percentile --      Diastolic BP Percentile --      Pulse --      Resp --      Temp --      Temp src --      SpO2 09/28/23 2138 91 %     Weight --      Height --      Head Circumference --      Peak Flow --      Pain Score 09/28/23 2139 9     Pain Loc --      Pain Education --      Exclude from Growth Chart --     Most recent vital signs: Vitals:   09/28/23 2140 09/28/23 2205  BP: (!) 125/111 (!) 81/58  Pulse: (!) 51 83  Resp: 20 19  Temp: 97.6 F (36.4 C)   SpO2: 91% 96%   Physical Exam: I have reviewed the vital signs and nursing notes. General: Awake, alert, no acute distress.  Chronically ill appearing. Head:  Atraumatic, normocephalic.   ENT:  EOM intact, PERRL. Oral mucosa is pink and moist with no lesions. Neck: Neck is supple with full range of motion, No meningeal  signs. Cardiovascular:  RRR, No murmurs. Peripheral pulses palpable and equal bilaterally. Respiratory:  Symmetrical chest wall expansion.  No rhonchi, rales, or wheezes.  Good air movement throughout.  No use of accessory muscles.   Musculoskeletal:  No cyanosis or edema. Moving extremities with full ROM Abdomen:  Soft, nontender, nondistended. Neuro:  GCS 15, moving all four extremities, interacting appropriately. Speech clear. Psych:  Calm, appropriate.   Skin:  Warm, dry, no rash.    ED Results / Procedures / Treatments   Labs (all labs ordered are listed, but only abnormal results are displayed) Labs Reviewed  CBC WITH DIFFERENTIAL/PLATELET - Abnormal; Notable for the following components:      Result Value   RBC 5.87 (*)    Hemoglobin 17.4 (*)    HCT 53.7 (*)    Abs Immature Granulocytes 0.10 (*)    All other components within normal limits  COMPREHENSIVE METABOLIC PANEL  TROPONIN I (HIGH SENSITIVITY)     EKG My EKG interpretation: Rate of 88, A-fib.  Normal  axis, QTc 47.  No acute ST elevation or depression   RADIOLOGY X-rays pending at time of signout   PROCEDURES:  Critical Care performed: No  Procedures   MEDICATIONS ORDERED IN ED: Medications  fentaNYL (SUBLIMAZE) injection 50 mcg (50 mcg Intravenous Given 09/28/23 2213)  sodium chloride 0.9 % bolus 500 mL (500 mLs Intravenous New Bag/Given 09/28/23 2213)  sodium chloride 0.9 % bolus 1,000 mL (1,000 mLs Intravenous New Bag/Given 09/28/23 2301)     IMPRESSION / MDM / ASSESSMENT AND PLAN / ED COURSE  I reviewed the triage vital signs and the nursing notes.                              Differential diagnosis includes, but is not limited to, medication induced hypotension, vasovagal syncope, orthostatic hypotension  Patient's presentation is most consistent with acute complicated illness / injury requiring diagnostic workup.  Patient is a 67 year old male presenting today for weakness and hypotension.   Recently had medication adjustment with carvedilol added.  Had episode of weakness and hypotension at home and is hypotensive on arrival here at 81/58.  Vague chest pain symptoms which are probably consistent with his baseline chest pain.  Otherwise no acute symptoms.  Chest x-ray and pelvis x-ray were ordered given recent fall.  Separately, patient was given fluids to attempt to help with blood pressure.  Patient was signed out pending results of laboratory workup and reassessment.  If he remains hypotensive after fluids, likely needs admission for medication adjustment.  The patient is on the cardiac monitor to evaluate for evidence of arrhythmia and/or significant heart rate changes. Clinical Course as of 09/28/23 2320  Tue Sep 28, 2023  2319 Creatinine(!): 1.95 New AKI [DW]    Clinical Course User Index [DW] Janith Lima, MD     FINAL CLINICAL IMPRESSION(S) / ED DIAGNOSES   Final diagnoses:  Weakness  Hypotension, unspecified hypotension type     Rx / DC Orders   ED Discharge Orders     None        Note:  This document was prepared using Dragon voice recognition software and may include unintentional dictation errors.   Janith Lima, MD 09/28/23 [phone]

## 2023-09-29 ENCOUNTER — Other Ambulatory Visit: Payer: Self-pay

## 2023-09-29 DIAGNOSIS — I2510 Atherosclerotic heart disease of native coronary artery without angina pectoris: Secondary | ICD-10-CM | POA: Insufficient documentation

## 2023-09-29 DIAGNOSIS — Z7984 Long term (current) use of oral hypoglycemic drugs: Secondary | ICD-10-CM | POA: Diagnosis not present

## 2023-09-29 DIAGNOSIS — R531 Weakness: Secondary | ICD-10-CM | POA: Diagnosis present

## 2023-09-29 DIAGNOSIS — I10 Essential (primary) hypertension: Secondary | ICD-10-CM | POA: Diagnosis not present

## 2023-09-29 DIAGNOSIS — N179 Acute kidney failure, unspecified: Secondary | ICD-10-CM

## 2023-09-29 DIAGNOSIS — K219 Gastro-esophageal reflux disease without esophagitis: Secondary | ICD-10-CM | POA: Diagnosis present

## 2023-09-29 DIAGNOSIS — R55 Syncope and collapse: Secondary | ICD-10-CM | POA: Diagnosis present

## 2023-09-29 DIAGNOSIS — J4489 Other specified chronic obstructive pulmonary disease: Secondary | ICD-10-CM | POA: Diagnosis present

## 2023-09-29 DIAGNOSIS — I482 Chronic atrial fibrillation, unspecified: Secondary | ICD-10-CM | POA: Insufficient documentation

## 2023-09-29 DIAGNOSIS — E119 Type 2 diabetes mellitus without complications: Secondary | ICD-10-CM | POA: Diagnosis not present

## 2023-09-29 DIAGNOSIS — I429 Cardiomyopathy, unspecified: Secondary | ICD-10-CM | POA: Diagnosis present

## 2023-09-29 DIAGNOSIS — I959 Hypotension, unspecified: Secondary | ICD-10-CM | POA: Diagnosis present

## 2023-09-29 DIAGNOSIS — M109 Gout, unspecified: Secondary | ICD-10-CM | POA: Diagnosis present

## 2023-09-29 DIAGNOSIS — G8929 Other chronic pain: Secondary | ICD-10-CM | POA: Diagnosis present

## 2023-09-29 DIAGNOSIS — I110 Hypertensive heart disease with heart failure: Secondary | ICD-10-CM | POA: Diagnosis present

## 2023-09-29 DIAGNOSIS — F419 Anxiety disorder, unspecified: Secondary | ICD-10-CM | POA: Insufficient documentation

## 2023-09-29 DIAGNOSIS — I480 Paroxysmal atrial fibrillation: Secondary | ICD-10-CM | POA: Diagnosis present

## 2023-09-29 DIAGNOSIS — Z66 Do not resuscitate: Secondary | ICD-10-CM | POA: Diagnosis present

## 2023-09-29 DIAGNOSIS — J9611 Chronic respiratory failure with hypoxia: Secondary | ICD-10-CM | POA: Diagnosis present

## 2023-09-29 DIAGNOSIS — E860 Dehydration: Secondary | ICD-10-CM | POA: Diagnosis present

## 2023-09-29 DIAGNOSIS — Z8249 Family history of ischemic heart disease and other diseases of the circulatory system: Secondary | ICD-10-CM | POA: Diagnosis not present

## 2023-09-29 DIAGNOSIS — E785 Hyperlipidemia, unspecified: Secondary | ICD-10-CM | POA: Insufficient documentation

## 2023-09-29 DIAGNOSIS — I5042 Chronic combined systolic (congestive) and diastolic (congestive) heart failure: Secondary | ICD-10-CM | POA: Diagnosis present

## 2023-09-29 DIAGNOSIS — Z7901 Long term (current) use of anticoagulants: Secondary | ICD-10-CM | POA: Diagnosis not present

## 2023-09-29 DIAGNOSIS — K900 Celiac disease: Secondary | ICD-10-CM | POA: Diagnosis present

## 2023-09-29 DIAGNOSIS — R072 Precordial pain: Secondary | ICD-10-CM | POA: Diagnosis present

## 2023-09-29 LAB — CBC
HCT: 49.2 % (ref 39.0–52.0)
Hemoglobin: 15.8 g/dL (ref 13.0–17.0)
MCH: 29.6 pg (ref 26.0–34.0)
MCHC: 32.1 g/dL (ref 30.0–36.0)
MCV: 92.1 fL (ref 80.0–100.0)
Platelets: 187 10*3/uL (ref 150–400)
RBC: 5.34 MIL/uL (ref 4.22–5.81)
RDW: 15.1 % (ref 11.5–15.5)
WBC: 8.4 10*3/uL (ref 4.0–10.5)
nRBC: 0 % (ref 0.0–0.2)

## 2023-09-29 LAB — GLUCOSE, CAPILLARY
Glucose-Capillary: 117 mg/dL — ABNORMAL HIGH (ref 70–99)
Glucose-Capillary: 120 mg/dL — ABNORMAL HIGH (ref 70–99)
Glucose-Capillary: 124 mg/dL — ABNORMAL HIGH (ref 70–99)
Glucose-Capillary: 131 mg/dL — ABNORMAL HIGH (ref 70–99)

## 2023-09-29 LAB — BASIC METABOLIC PANEL
Anion gap: 9 (ref 5–15)
BUN: 20 mg/dL (ref 8–23)
CO2: 31 mmol/L (ref 22–32)
Calcium: 9.2 mg/dL (ref 8.9–10.3)
Chloride: 103 mmol/L (ref 98–111)
Creatinine, Ser: 1.77 mg/dL — ABNORMAL HIGH (ref 0.61–1.24)
GFR, Estimated: 41 mL/min — ABNORMAL LOW (ref >60)
Glucose, Bld: 136 mg/dL — ABNORMAL HIGH (ref 70–99)
Potassium: 4.1 mmol/L (ref 3.5–5.1)
Sodium: 143 mmol/L (ref 135–145)

## 2023-09-29 LAB — TROPONIN I (HIGH SENSITIVITY): Troponin I (High Sensitivity): 17 ng/L (ref <18)

## 2023-09-29 MED ORDER — ISOSORBIDE MONONITRATE ER 60 MG PO TB24
120.0000 mg | ORAL_TABLET | Freq: Every day | ORAL | Status: DC
  Administered 2023-09-29: 120 mg via ORAL
  Filled 2023-09-29: qty 2

## 2023-09-29 MED ORDER — VITAMIN B-12 1000 MCG PO TABS
1000.0000 ug | ORAL_TABLET | Freq: Every day | ORAL | Status: DC
  Administered 2023-09-29 – 2023-09-30 (×2): 1000 ug via ORAL
  Filled 2023-09-29 (×2): qty 1

## 2023-09-29 MED ORDER — VENLAFAXINE HCL ER 75 MG PO CP24
75.0000 mg | ORAL_CAPSULE | Freq: Every day | ORAL | Status: DC
  Administered 2023-09-29 – 2023-09-30 (×2): 75 mg via ORAL
  Filled 2023-09-29 (×2): qty 1

## 2023-09-29 MED ORDER — TRAZODONE HCL 100 MG PO TABS
100.0000 mg | ORAL_TABLET | Freq: Every day | ORAL | Status: DC
  Administered 2023-09-29: 100 mg via ORAL
  Filled 2023-09-29: qty 1

## 2023-09-29 MED ORDER — SODIUM CHLORIDE 0.9 % IV BOLUS
250.0000 mL | Freq: Once | INTRAVENOUS | Status: AC
  Administered 2023-09-29: 250 mL via INTRAVENOUS

## 2023-09-29 MED ORDER — INSULIN ASPART 100 UNIT/ML IJ SOLN
0.0000 [IU] | Freq: Three times a day (TID) | INTRAMUSCULAR | Status: DC
  Administered 2023-09-30: 2 [IU] via SUBCUTANEOUS
  Filled 2023-09-29: qty 1

## 2023-09-29 MED ORDER — UMECLIDINIUM BROMIDE 62.5 MCG/ACT IN AEPB
1.0000 | INHALATION_SPRAY | Freq: Every day | RESPIRATORY_TRACT | Status: DC
  Administered 2023-09-29 – 2023-09-30 (×2): 1 via RESPIRATORY_TRACT
  Filled 2023-09-29: qty 7

## 2023-09-29 MED ORDER — MAGNESIUM HYDROXIDE 400 MG/5ML PO SUSP
30.0000 mL | Freq: Every day | ORAL | Status: DC | PRN
Start: 2023-09-29 — End: 2023-09-30

## 2023-09-29 MED ORDER — RANOLAZINE ER 500 MG PO TB12
1000.0000 mg | ORAL_TABLET | Freq: Two times a day (BID) | ORAL | Status: DC
  Administered 2023-09-29 – 2023-09-30 (×3): 1000 mg via ORAL
  Filled 2023-09-29 (×4): qty 2

## 2023-09-29 MED ORDER — ORAL CARE MOUTH RINSE: 15.0000 mL | OROMUCOSAL | Status: DC | PRN

## 2023-09-29 MED ORDER — DICYCLOMINE HCL 10 MG PO CAPS: 10.0000 mg | ORAL_CAPSULE | Freq: Three times a day (TID) | ORAL | Status: DC | PRN

## 2023-09-29 MED ORDER — ADULT MULTIVITAMIN W/MINERALS CH
1.0000 | ORAL_TABLET | Freq: Every day | ORAL | Status: DC
  Administered 2023-09-30: 1 via ORAL
  Filled 2023-09-29: qty 1

## 2023-09-29 MED ORDER — OMEGA-3-ACID ETHYL ESTERS 1 G PO CAPS
4.0000 g | ORAL_CAPSULE | Freq: Every day | ORAL | Status: DC
Start: 2023-09-29 — End: 2023-09-30
  Administered 2023-09-29 – 2023-09-30 (×2): 4 g via ORAL
  Filled 2023-09-29 (×2): qty 4

## 2023-09-29 MED ORDER — ONDANSETRON HCL 4 MG/2ML IJ SOLN: 4.0000 mg | Freq: Four times a day (QID) | INTRAMUSCULAR | Status: DC | PRN

## 2023-09-29 MED ORDER — APIXABAN 5 MG PO TABS
5.0000 mg | ORAL_TABLET | Freq: Two times a day (BID) | ORAL | Status: DC
  Administered 2023-09-29 – 2023-09-30 (×3): 5 mg via ORAL
  Filled 2023-09-29 (×3): qty 1

## 2023-09-29 MED ORDER — ACETAMINOPHEN 650 MG RE SUPP: 650.0000 mg | Freq: Four times a day (QID) | RECTAL | Status: DC | PRN

## 2023-09-29 MED ORDER — ALPRAZOLAM 0.5 MG PO TABS
1.0000 mg | ORAL_TABLET | Freq: Three times a day (TID) | ORAL | Status: DC | PRN
  Administered 2023-09-29 – 2023-09-30 (×4): 1 mg via ORAL
  Filled 2023-09-29 (×4): qty 2

## 2023-09-29 MED ORDER — SODIUM CHLORIDE 0.9 % IV SOLN: INTRAVENOUS | Status: DC

## 2023-09-29 MED ORDER — POLYSACCHARIDE IRON COMPLEX 150 MG PO CAPS
150.0000 mg | ORAL_CAPSULE | Freq: Every day | ORAL | Status: DC
  Administered 2023-09-29 – 2023-09-30 (×2): 150 mg via ORAL
  Filled 2023-09-29 (×2): qty 1

## 2023-09-29 MED ORDER — ATORVASTATIN CALCIUM 20 MG PO TABS
80.0000 mg | ORAL_TABLET | Freq: Every day | ORAL | Status: DC
  Administered 2023-09-29: 80 mg via ORAL
  Filled 2023-09-29: qty 4

## 2023-09-29 MED ORDER — TRAZODONE HCL 50 MG PO TABS
25.0000 mg | ORAL_TABLET | Freq: Every evening | ORAL | Status: DC | PRN
  Administered 2023-09-29: 25 mg via ORAL
  Filled 2023-09-29: qty 1

## 2023-09-29 MED ORDER — NITROGLYCERIN 0.4 MG SL SUBL: 0.4000 mg | SUBLINGUAL_TABLET | SUBLINGUAL | Status: DC | PRN

## 2023-09-29 MED ORDER — MIDODRINE HCL 5 MG PO TABS
10.0000 mg | ORAL_TABLET | ORAL | Status: AC
  Administered 2023-09-29: 10 mg via ORAL
  Filled 2023-09-29: qty 2

## 2023-09-29 MED ORDER — ENSURE ENLIVE PO LIQD
237.0000 mL | Freq: Three times a day (TID) | ORAL | Status: DC
  Administered 2023-09-29 – 2023-09-30 (×3): 237 mL via ORAL

## 2023-09-29 MED ORDER — ONDANSETRON HCL 4 MG PO TABS: 4.0000 mg | ORAL_TABLET | Freq: Four times a day (QID) | ORAL | Status: DC | PRN

## 2023-09-29 MED ORDER — OXYCODONE-ACETAMINOPHEN 5-325 MG PO TABS
2.0000 | ORAL_TABLET | ORAL | Status: DC | PRN
  Administered 2023-09-29 – 2023-09-30 (×6): 2 via ORAL
  Filled 2023-09-29 (×6): qty 2

## 2023-09-29 MED ORDER — THIAMINE HCL 100 MG PO TABS
100.0000 mg | ORAL_TABLET | Freq: Every day | ORAL | Status: DC
  Administered 2023-09-30: 100 mg via ORAL
  Filled 2023-09-29 (×2): qty 1

## 2023-09-29 MED ORDER — FLUTICASONE FUROATE-VILANTEROL 200-25 MCG/ACT IN AEPB
1.0000 | INHALATION_SPRAY | Freq: Every day | RESPIRATORY_TRACT | Status: DC
  Administered 2023-09-29 – 2023-09-30 (×2): 1 via RESPIRATORY_TRACT
  Filled 2023-09-29: qty 28

## 2023-09-29 MED ORDER — DAPAGLIFLOZIN PROPANEDIOL 10 MG PO TABS
10.0000 mg | ORAL_TABLET | Freq: Every day | ORAL | Status: DC
  Filled 2023-09-29: qty 1

## 2023-09-29 MED ORDER — LORATADINE 10 MG PO TABS
10.0000 mg | ORAL_TABLET | Freq: Every day | ORAL | Status: DC
  Administered 2023-09-29: 10 mg via ORAL
  Filled 2023-09-29: qty 1

## 2023-09-29 MED ORDER — ACETAMINOPHEN 325 MG PO TABS: 650.0000 mg | ORAL_TABLET | Freq: Four times a day (QID) | ORAL | Status: DC | PRN

## 2023-09-29 MED ORDER — ENSURE ENLIVE PO LIQD
237.0000 mL | Freq: Two times a day (BID) | ORAL | Status: DC
  Administered 2023-09-29: 237 mL via ORAL

## 2023-09-29 MED ORDER — SODIUM CHLORIDE 0.9 % IV BOLUS
250.0000 mL | INTRAVENOUS | Status: AC
  Administered 2023-09-29: 250 mL via INTRAVENOUS

## 2023-09-29 MED ORDER — BUDESON-GLYCOPYRROL-FORMOTEROL 160-9-4.8 MCG/ACT IN AERO: 2.0000 | INHALATION_SPRAY | Freq: Two times a day (BID) | RESPIRATORY_TRACT | Status: DC

## 2023-09-29 MED ORDER — SODIUM CHLORIDE 0.9 % IV BOLUS
500.0000 mL | Freq: Once | INTRAVENOUS | Status: AC
  Administered 2023-09-29: 500 mL via INTRAVENOUS

## 2023-09-29 MED ORDER — CLONAZEPAM 0.5 MG PO TABS
0.5000 mg | ORAL_TABLET | Freq: Two times a day (BID) | ORAL | Status: DC
  Administered 2023-09-29 – 2023-09-30 (×3): 0.5 mg via ORAL
  Filled 2023-09-29 (×3): qty 1

## 2023-09-29 MED ORDER — LIDOCAINE 5 % EX PTCH
1.0000 | MEDICATED_PATCH | CUTANEOUS | Status: DC
  Administered 2023-09-29 – 2023-09-30 (×2): 1 via TRANSDERMAL
  Filled 2023-09-29 (×2): qty 1

## 2023-09-29 MED ORDER — SODIUM CHLORIDE 0.9% FLUSH
3.0000 mL | Freq: Two times a day (BID) | INTRAVENOUS | Status: DC
  Administered 2023-09-29 – 2023-09-30 (×4): 3 mL via INTRAVENOUS

## 2023-09-29 NOTE — Assessment & Plan Note (Signed)
-   Will obtain continue Eliquis and beta-blocker therapy.

## 2023-09-29 NOTE — Progress Notes (Signed)
Update Dr.Mansy about low blood pressure. New orders placed.

## 2023-09-29 NOTE — Assessment & Plan Note (Signed)
-   This is likely prerenal due to volume depletion and dehydration as well as hypotension. - The patient will be hydrated with IV normal saline. - Will follow BMPs. - We will avoid nephrotoxins.

## 2023-09-29 NOTE — Assessment & Plan Note (Signed)
-   The patient will be placed on supplemental coverage with NovoLog. - We will hold off metformin given AKI. - We will continue [place].

## 2023-09-29 NOTE — Progress Notes (Signed)
   09/29/23 0439  Assess: MEWS Score  BP (!) 69/56  MAP (mmHg) (!) 60  Pulse Rate (!) 48  ECG Heart Rate 73  SpO2 95 %  Assess: MEWS Score  MEWS Temp 0  MEWS Systolic 3  MEWS Pulse 0  MEWS RR 0  MEWS LOC 0  MEWS Score 3  MEWS Score Color Yellow  Assess: SIRS CRITERIA  SIRS Temperature  0  SIRS Pulse 0  SIRS Respirations  0  SIRS WBC 0  SIRS Score Sum  0   Orthostatic vitals, MD aware MEWs not initiated.

## 2023-09-29 NOTE — Evaluation (Signed)
Physical Therapy Evaluation Patient Details Name: Lawrence [name] MRN: [id number] DOB: 1956/03/30 Today's Date: 09/29/2023  History of Present Illness  67 y.o. Caucasian male with medical history significant for atrial fibrillation, anxiety, osteoarthritis, asthma, coronary artery disease, celiac disease, COPD, GERD, hypertension,, coronary artery disease status post PCI and stents placement, who presented to the emergency room with acute onset of generalized weakness and presyncope with lightheadedness.  Hypotensive on arrival.  Clinical Impression  Pt very pleasant and eager to work with PT.  He recalled this PT from prior admission and was pleased to tell me that he now has his grandson living with him and that he has been able to manage well.  Pt reported no dizziness t/o session, was willing to try a prolonged about of ambulation and ultimately was able to circumambulate the nurses' station with relatively confidence and reports feeling close to his baseline.  Pt will not likely need continued PT at discharge (reports he was having HHPT and his grandson has been making sure he does regular exercises and stays active), but will benefit from continued PT in house to insure we maintain activity, fine tune gait and insure safe d/c disposition.          If plan is discharge home, recommend the following: Assistance with cooking/housework;Assist for transportation   Can travel by private vehicle        Equipment Recommendations None recommended by PT  Recommendations for Other Services       Functional Status Assessment Patient has had a recent decline in their functional status and demonstrates the ability to make significant improvements in function in a reasonable and predictable amount of time.     Precautions / Restrictions Precautions Precautions: Fall (watch BP) Restrictions Weight Bearing Restrictions: No      Mobility  Bed Mobility Overal bed mobility: Independent              General bed mobility comments: Pt able to rise to sitting EOB with good confidence, appropriate UE use on rail    Transfers Overall transfer level: Modified independent Equipment used: Rolling walker (2 wheels)               General transfer comment: Minimal cuing, pt showed good self selected technique/strategy    Ambulation/Gait Ambulation/Gait assistance: Supervision Gait Distance (Feet): 350 Feet Assistive device: Rolling walker (2 wheels)         General Gait Details: Pt was able to maintain relatively consistent cadence and showed only minimal fatigue and good safety/stability. 2L O2 t/o the session - SpO2 high 90s with HR 60-80s  Stairs            Wheelchair Mobility     Tilt Bed    Modified Rankin (Stroke Patients Only)       Balance Overall balance assessment: Independent                                           Pertinent Vitals/Pain Pain Assessment Pain Assessment: No/denies pain    Home Living Family/patient expects to be discharged to:: Private residence Living Arrangements: Other relatives (grandson) Available Help at Discharge: Available PRN/intermittently;Friend(s) Type of Home: Apartment Home Access: Level entry;Ramped entrance       Home Layout: One level Home Equipment: Shower seat - built in;Rollator (4 wheels) Additional Comments: O2 2L    Prior Function Prior Level of  Function : Independent/Modified Independent;History of Falls (last six months)             Mobility Comments: Mod I using rollator, has had numerous falls - does not drive but grand-son/neighbor takes him to run errands 1-2x/wk - limited OOH ambulator ADLs Comments: Reports he is essentially independent with basic ADLs, etc     Extremity/Trunk Assessment   Upper Extremity Assessment Upper Extremity Assessment: Overall WFL for tasks assessed;Generalized weakness    Lower Extremity Assessment Lower Extremity Assessment:  Overall WFL for tasks assessed;Generalized weakness       Communication   Communication Communication: No apparent difficulties  Cognition Arousal: Alert Behavior During Therapy: WFL for tasks assessed/performed Overall Cognitive Status: Within Functional Limits for tasks assessed                                          General Comments      Exercises     Assessment/Plan    PT Assessment Patient needs continued PT services  PT Problem List Decreased strength;Decreased range of motion;Decreased activity tolerance;Decreased mobility;Decreased balance;Decreased safety awareness;Cardiopulmonary status limiting activity       PT Treatment Interventions DME instruction;Gait training;Functional mobility training;Therapeutic activities;Therapeutic exercise;Balance training;Neuromuscular re-education;Patient/family education    PT Goals (Current goals can be found in the Care Plan section)  Acute Rehab PT Goals Patient Stated Goal: Go home PT Goal Formulation: With patient Time For Goal Achievement: 10/12/23 Potential to Achieve Goals: Good    Frequency Min 1X/week     Co-evaluation               AM-PAC PT "6 Clicks" Mobility  Outcome Measure Help needed turning from your back to your side while in a flat bed without using bedrails?: None Help needed moving from lying on your back to sitting on the side of a flat bed without using bedrails?: None Help needed moving to and from a bed to a chair (including a wheelchair)?: None Help needed standing up from a chair using your arms (e.g., wheelchair or bedside chair)?: None Help needed to walk in hospital room?: A Little Help needed climbing 3-5 steps with a railing? : A Little 6 Click Score: 22    End of Session Equipment Utilized During Treatment: Gait belt;Oxygen (2L) Activity Tolerance: Patient tolerated treatment well Patient left: with chair alarm set;with call bell/phone within reach Nurse  Communication: Mobility status PT Visit Diagnosis: Muscle weakness (generalized) (M62.81);Dizziness and giddiness (R42);Difficulty in walking, not elsewhere classified (R26.2)    Time: 1610-9604 PT Time Calculation (min) (ACUTE ONLY): 28 min   Charges:   PT Evaluation $PT Eval Low Complexity: 1 Low PT Treatments $Gait Training: 8-22 mins PT General Charges $$ ACUTE PT VISIT: 1 Visit         Malachi Pro, DPT 09/29/2023, 3:48 PM

## 2023-09-29 NOTE — Assessment & Plan Note (Signed)
-   We will continue beta-blocker therapy as well as Imdur, Ranexa and as needed sublingual nitroglycerin.

## 2023-09-29 NOTE — Assessment & Plan Note (Signed)
-   We will continue Xanax. 

## 2023-09-29 NOTE — Progress Notes (Addendum)
Initial Nutrition Assessment  DOCUMENTATION CODES:   Not applicable  INTERVENTION:   Ensure Enlive po TID, each supplement provides 350 kcal and 20 grams of protein.  Magic cup TID with meals, each supplement provides 290 kcal and 9 grams of protein  MVI po daily   Thiamine 100mg  po daily   Pt at high refeed risk; recommend monitor potassium, magnesium and phosphorus labs daily until stable  Daily weights   NUTRITION DIAGNOSIS:   Increased nutrient needs related to catabolic illness as evidenced by estimated needs.  GOAL:   Patient will meet greater than or equal to 90% of their needs  MONITOR:   PO intake, Supplement acceptance, Labs, Weight trends, Skin, I & O's  REASON FOR ASSESSMENT:   Consult Assessment of nutrition requirement/status  ASSESSMENT:   67 y/o male with h/o gout, DM, HTN, HLD, anxiety, depression, CAD, BPH, CHF, PAD, esophageal stenosis s/p dilation, COPD, celiac disease, GERD, MI, DDD, OSA, pancreatitis, etoh abuse, marijuana use and recurrent syncope who is admitted near syncope and with AKI.  RD working remotely.  Spoke with pt via phone. Pt reports good appetite and oral intake at baseline but reports despite eating well, he has lost about 60lbs over the past year. Per chart, pt is down 23lbs(11%) over the past year; this is not significant given the time frame. Pt reports that he does like chocolate Ensure but he does not drink this at home. Pt did drink an Ensure after breakfast this morning. Pt reports eating well in hospital; per chart, pt ate 95% of his breakfast today. RD discussed with pt the importance of adequate nutrition needed to preserve lean muscle. RD will add supplements and vitamins to help pt meet his estimated needs. Pt reports that he does love sherbet ice cream. Pt is likely at refeed risk. RD will obtain nutrition related exam at follow up.    Of note, pt noted to have h/o celiac disease. Pt reports that he does not avoid gluten  and he eats whole wheat bread at home.   Medications reviewed and include: B12, insulin, iron, MVI, lovaza  Labs reviewed: K 4.1 wnl, creat 1.77(H) Cbgs- 120, 131 x 24 hrs  AIC 6.1(H)- 9/24  NUTRITION - FOCUSED PHYSICAL EXAM: Unable to perform at this time   Diet Order:   Diet Order             Diet heart healthy/carb modified Room service appropriate? Yes; Fluid consistency: Thin  Diet effective now                  EDUCATION NEEDS:   Education needs have been addressed  Skin:  Skin Assessment: Reviewed RN Assessment (ecchymosis)  Last BM:  11/12  Height:   Ht Readings from Last 1 Encounters:  09/29/23 5\' 7"  (1.702 m)    Weight:   Wt Readings from Last 1 Encounters:  09/29/23 83.6 kg    Ideal Body Weight:  67.2 kg  BMI:  Body mass index is 28.85 kg/m.  Estimated Nutritional Needs:   Kcal:  1900-2200kcal/day  Protein:  95-110g/day  Fluid:  1.7-2.0L/day  Betsey Holiday MS, RD, LDN Please refer to [facility] for RD and/or RD on-call/weekend/after hours pager

## 2023-09-29 NOTE — Assessment & Plan Note (Signed)
-   The patient will be admitted to an observation medically monitored bed. - Will check orthostatics q 12 hours. - The patient will be gently hydrated with IV normal saline and monitored for arrhythmias. -Differential diagnoses would include likely orthostatic hypotension with volume depletion and dehydration neurally mediated syncope, cardiogenic, arrhythmias related, and less likely hypoglycemia.

## 2023-09-29 NOTE — H&P (Signed)
[place]   PATIENT NAME: Lawrence [name]    MR#:  [id number]  DATE OF BIRTH:  1956/01/24  DATE OF ADMISSION:  09/28/2023  PRIMARY CARE PHYSICIAN: Malva Limes, MD   Patient is coming from: Home  REQUESTING/REFERRING PHYSICIAN: Ward, Layla Maw, DO  CHIEF COMPLAINT:   Chief Complaint  Patient presents with   Weakness    hypotension    HISTORY OF PRESENT ILLNESS:  Lawrence [name] is a 67 y.o. Caucasian male with medical history significant for atrial fibrillation, anxiety, osteoarthritis, asthma, coronary artery disease, celiac disease, COPD, GERD, hypertension,, coronary artery disease status post PCI and stents placement, who presented to the emergency room with acute onset of generalized weakness and presyncope with lightheadedness.  He was recently started on Coreg and Aldactone.  His blood pressure readings were low.  He denied any loss of consciousness though.  He admits to midsternal chest pain without nausea or vomiting or diaphoresis, dyspnea or palpitations.  He has been having chronic chest pain.  He admits to recent diarrhea.  No orthopnea or paroxysmal nocturnal dyspnea or worsening lower extremity edema.  He had a fall within the last 24 hours when he hit the front of his chest and his hip.  No head injuries.  No paresthesias or focal muscle weakness.  No dysuria, oliguria or hematuria or flank pain.  The patient had multiple ER visits over the last 6 months.  ED Course: When patient came to the ER, BP was 78/59 and later 92/72 MAP of 88 from 67, heart rate of 148 and later 75 then 100 with otherwise normal vital signs.  Labs revealed borderline potassium of 3.5 and a creatinine of 1.95 up from previous normal value.  Total bili was 1.3 and high sensitive troponin I was 23.  CBC showed hemoconcentration.  Blood glucose was 172.  Portable chest x-ray showed EKG as reviewed by me : EKG showed atrial fibrillation with controlled ventricular response of 75 with old Q  waves. Imaging: Pelvic x-ray showed no osseous abnormality and portable chest x-ray showed no acute cardiopulmonary disease.  It showed midline sternotomy.  The patient was given 1.5 L of IV normal saline bolus and 50 mcg of IV fentanyl.  He will be admitted to an patient medical telemetry bed for further evaluation and management. PAST MEDICAL HISTORY:   Past Medical History:  Diagnosis Date   A-fib (HCC)    Anemia    Anginal pain (HCC)    Anxiety    Arthritis    Asthma    CAD (coronary artery disease)    a. 2002 CABGx2 (LIMA->LAD, VG->VG->OM1);  b. 09/2012 DES->OM;  c. 03/2015 PTCA of LAD [facility]) in setting of atretic LIMA; d. 05/2015 Cath [facility]): nonobs dzs; e. 06/2015 Cath ([facility]): LM nl, LAD 45p/d ISR, 50d, D1/2 small, LCX 50p/d ISR, OM1 70ost, 30 ISR, VG->OM1 50ost, 60m, LIMA->LAD 99p/d - atretic, RCA dom, nl; f.cath 10/16: 40-50%(FFR 0.90) pLAD, 75% (FFR 0.77) mLAD s/p PCI/DES, oRCA 40% (FFR0.95)   Cancer (HCC)    SKIN CANCER ON BACK   Celiac disease    Chronic diastolic CHF (congestive heart failure) (HCC)    a. 06/2009 Echo: EF 60-65%, Gr 1 DD, triv AI, mildly dil LA, nl RV.   COPD (chronic obstructive pulmonary disease) (HCC)    a. Chronic bronchitis and emphysema.   DDD (degenerative disc disease), lumbar    Diverticulosis    Dysrhythmia    Essential hypertension  GERD (gastroesophageal reflux disease)    History of hiatal hernia    History of kidney stones    H/O   History of tobacco abuse    a. Quit 2014.   Myocardial infarction [facility]) 2002   4 STENTS   Pancreatitis    PSVT (paroxysmal supraventricular tachycardia) (HCC)    a. 10/2012 Noted on Zio Patch.   Sleep apnea    LOST CORD TO CPAP -ONLY 02 @ BEDTIME   Tubular adenoma of colon    Type II diabetes mellitus (HCC)     PAST SURGICAL HISTORY:   Past Surgical History:  Procedure Laterality Date   BYPASS GRAFT     CARDIAC CATHETERIZATION N/A 07/12/2015   rocedure: Left Heart Cath and Cors/Grafts Angiography;   Surgeon: Lyn Records, MD;  Location: [facility] INVASIVE CV LAB;  Service: Cardiovascular;  Laterality: N/A;   CARDIAC CATHETERIZATION Right 10/07/2015   Procedure: Left Heart Cath and Cors/Grafts Angiography;  Surgeon: Laurier Nancy, MD;  Location: [facility] INVASIVE CV LAB;  Service: Cardiovascular;  Laterality: Right;   CARDIAC CATHETERIZATION N/A 04/06/2016   Procedure: Left Heart Cath and Coronary Angiography;  Surgeon: Alwyn Pea, MD;  Location: [facility] INVASIVE CV LAB;  Service: Cardiovascular;  Laterality: N/A;   CARDIAC CATHETERIZATION  04/06/2016   Procedure: Bypass Graft Angiography;  Surgeon: Alwyn Pea, MD;  Location: [facility] INVASIVE CV LAB;  Service: Cardiovascular;;   CARDIAC CATHETERIZATION N/A 11/02/2016   Procedure: Left Heart Cath and Cors/Grafts Angiography and possible PCI;  Surgeon: Alwyn Pea, MD;  Location: [facility] INVASIVE CV LAB;  Service: Cardiovascular;  Laterality: N/A;   CARDIAC CATHETERIZATION N/A 11/02/2016   Procedure: Coronary Stent Intervention;  Surgeon: Alwyn Pea, MD;  Location: [facility] INVASIVE CV LAB;  Service: Cardiovascular;  Laterality: N/A;   CARDIOVERSION N/A 06/29/2022   Procedure: CARDIOVERSION;  Surgeon: Lamar Blinks, MD;  Location: [facility] ORS;  Service: Cardiovascular;  Laterality: N/A;   CHOLECYSTECTOMY     CIRCUMCISION N/A 06/09/2019   Procedure: CIRCUMCISION ADULT;  Surgeon: Sondra Come, MD;  Location: [facility] ORS;  Service: Urology;  Laterality: N/A;   COLONOSCOPY WITH PROPOFOL N/A 04/01/2018   Procedure: COLONOSCOPY WITH PROPOFOL;  Surgeon: Scot Jun, MD;  Location: [facility] ENDOSCOPY;  Service: Endoscopy;  Laterality: N/A;   COLONOSCOPY WITH PROPOFOL N/A 05/01/2023   Procedure: COLONOSCOPY WITH PROPOFOL;  Surgeon: Toney Reil, MD;  Location: [facility] ENDOSCOPY;  Service: Gastroenterology;  Laterality: N/A;   ESOPHAGEAL DILATION     ESOPHAGOGASTRODUODENOSCOPY (EGD) WITH PROPOFOL N/A 04/01/2018   Procedure:  ESOPHAGOGASTRODUODENOSCOPY (EGD) WITH PROPOFOL;  Surgeon: Scot Jun, MD;  Location: [facility] ENDOSCOPY;  Service: Endoscopy;  Laterality: N/A;   ESOPHAGOGASTRODUODENOSCOPY (EGD) WITH PROPOFOL N/A 05/01/2023   Procedure: ESOPHAGOGASTRODUODENOSCOPY (EGD) WITH PROPOFOL;  Surgeon: Toney Reil, MD;  Location: [facility] ENDOSCOPY;  Service: Gastroenterology;  Laterality: N/A;   GIVENS CAPSULE STUDY  05/01/2023   Procedure: GIVENS CAPSULE STUDY;  Surgeon: Toney Reil, MD;  Location: [facility] ENDOSCOPY;  Service: Gastroenterology;;   LEFT HEART CATH AND CORS/GRAFTS ANGIOGRAPHY N/A 06/12/2019   Procedure: LEFT HEART CATH AND CORS/GRAFTS ANGIOGRAPHY;  Surgeon: Dalia Heading, MD;  Location: [facility] INVASIVE CV LAB;  Service: Cardiovascular;  Laterality: N/A;   LEFT HEART CATH AND CORS/GRAFTS ANGIOGRAPHY N/A 03/11/2020   Procedure: LEFT HEART CATH AND CORS/GRAFTS ANGIOGRAPHY;  Surgeon: Marcina Millard, MD;  Location: [facility] INVASIVE CV LAB;  Service: Cardiovascular;  Laterality: N/A;   LEFT HEART CATH AND CORS/GRAFTS ANGIOGRAPHY N/A 05/01/2021  Procedure: LEFT HEART CATH AND CORS/GRAFTS ANGIOGRAPHY;  Surgeon: Lamar Blinks, MD;  Location: [facility] INVASIVE CV LAB;  Service: Cardiovascular;  Laterality: N/A;   LEFT HEART CATH AND CORS/GRAFTS ANGIOGRAPHY N/A 11/02/2022   Procedure: LEFT HEART CATH AND CORS/GRAFTS ANGIOGRAPHY;  Surgeon: Alwyn Pea, MD;  Location: [facility] INVASIVE CV LAB;  Service: Cardiovascular;  Laterality: N/A;   TONSILLECTOMY     VASCULAR SURGERY      SOCIAL HISTORY:   Social History   Tobacco Use   Smoking status: Former    Current packs/day: 0.00    Average packs/day: 3.0 packs/day for 50.0 years (150.0 ttl pk-yrs)    Types: Cigarettes    Start date: 04/23/1963    Quit date: 04/22/2013    Years since quitting: 10.4   Smokeless tobacco: Never   Tobacco comments:    Reports not smoking for approx 8 years.  Substance Use Topics   Alcohol use: No    Comment: remotely  quit alcohol use. Hx of heavy alcohol use.    FAMILY HISTORY:   Family History  Problem Relation Age of Onset   Heart attack Mother    Depression Mother    Heart disease Mother    COPD Mother    Hypertension Mother    Heart attack Father    Diabetes Father    Depression Father    Heart disease Father    Cirrhosis Father    Parkinson's disease Brother     DRUG ALLERGIES:   Allergies  Allergen Reactions   Prednisone Other (See Comments) and Hypertension    Pt states that this medication puts him in A-fib    Demerol  [Meperidine Hcl]    Demerol [Meperidine] Hives   Jardiance [Empagliflozin] Other (See Comments)    Perineal pain   Sulfa Antibiotics Hives   Albuterol Sulfate [Albuterol] Palpitations and Other (See Comments)    Pt currently uses this medication.     Morphine Sulfate Nausea And Vomiting, Rash and Other (See Comments)    Pt states that he is only allergic to the tablet form of this medication.      REVIEW OF SYSTEMS:   ROS As per history of present illness. All pertinent systems were reviewed above. Constitutional, HEENT, cardiovascular, respiratory, GI, GU, musculoskeletal, neuro, psychiatric, endocrine, integumentary and hematologic systems were reviewed and are otherwise negative/unremarkable except for positive findings mentioned above in the HPI.   MEDICATIONS AT HOME:   Prior to Admission medications   Medication Sig Start Date End Date Taking? Authorizing Provider  Accu-Chek Softclix Lancets lancets Use as instructed to check sugar daily for type 2 diabetes. 03/03/21   Malva Limes, MD  allopurinol (ZYLOPRIM) 300 MG tablet TAKE 1 TABLET BY MOUTH TWICE A DAY Patient not taking: Reported on 09/27/2023 03/13/23   Malva Limes, MD  ALPRAZolam Prudy Feeler) 1 MG tablet Take 1 tablet (1 mg total) by mouth 3 (three) times daily as needed. 09/13/23   Malva Limes, MD  amLODipine (NORVASC) 5 MG tablet Take 1 tablet (5 mg total) by mouth daily. 09/15/23    Malva Limes, MD  atorvastatin (LIPITOR) 80 MG tablet TAKE 1 TABLET BY MOUTH AT BEDTIME 07/15/23   Malva Limes, MD  Blood Glucose Calibration (ACCU-CHEK GUIDE CONTROL) LIQD Use with blood glucose monitor as directed 02/20/21   Malva Limes, MD  Blood Glucose Monitoring Suppl (ACCU-CHEK GUIDE) w/Device KIT Use to check blood sugars as directed 02/20/21   Malva Limes, MD  Budeson-Glycopyrrol-Formoterol (BREZTRI AEROSPHERE) 160-9-4.8 MCG/ACT AERO Inhale 2 puffs into the lungs 2 (two) times daily. 06/02/23   Malva Limes, MD  carvedilol (COREG) 3.125 MG tablet Take 3.125 mg by mouth 2 (two) times daily with a meal.    Lovenia Shuck, MD  cetirizine (ZYRTEC) 10 MG tablet TAKE 1 TABLET BY MOUTH AT BEDTIME 03/12/23   Malva Limes, MD  clonazePAM (KLONOPIN) 0.5 MG tablet Take 0.5 mg by mouth 2 (two) times daily.    [provider]  Cyanocobalamin (VITAMIN B-12) 1000 MCG SUBL Place 1 tablet under the tongue daily.    [provider]  dicyclomine (BENTYL) 10 MG capsule Take 1 capsule (10 mg total) by mouth 3 (three) times daily as needed (abdominal pain). 05/14/23   Phineas Semen, MD  ELIQUIS 5 MG TABS tablet TAKE 1 TABLET BY MOUTH TWICE A DAY 08/16/23   Malva Limes, MD  FARXIGA 10 MG TABS tablet Take 10 mg by mouth daily. 05/25/23   [provider]  glucose blood (ACCU-CHEK GUIDE) test strip Use as instructed to check sugar daily for type 2 diabetes. 03/03/21   Malva Limes, MD  iron polysaccharides (FERREX 150) 150 MG capsule TAKE 1 CAPSULE BY MOUTH DAILY 09/15/23   Malva Limes, MD  isosorbide mononitrate (IMDUR) 120 MG 24 hr tablet TAKE 1 TABLET BY MOUTH DAILY 08/16/23   Malva Limes, MD  lidocaine (LIDODERM) 5 % Place 1 patch onto the skin daily. Remove & Discard patch within 12 hours or as directed by MD 08/09/23   Arnetha Courser, MD  lisinopril (ZESTRIL) 20 MG tablet Take 20 mg by mouth daily.    [provider]  metFORMIN (GLUCOPHAGE-XR)  500 MG 24 hr tablet Take 1 tablet (500 mg total) by mouth 2 (two) times daily. Patient taking differently: Take 500 mg by mouth daily. 07/21/23   Jacky Kindle, FNP  methocarbamol (ROBAXIN) 500 MG tablet TAKE 1 TABLET BY MOUTH EVERY 8 HOURS AS NEEDED FOR MUSCLE SPASMS 07/15/23   Malva Limes, MD  metoprolol succinate (TOPROL-XL) 25 MG 24 hr tablet TAKE 1/2 TABLET (12.5 MG TOTAL) BY MOUTHDAILY Patient not taking: Reported on 09/27/2023 08/26/23   Malva Limes, MD  nitroGLYCERIN (NITROSTAT) 0.4 MG SL tablet Place 1 tablet (0.4 mg total) under the tongue every 5 (five) minutes as needed for chest pain. 09/03/23   Malva Limes, MD  omega-3 acid ethyl esters (LOVAZA) 1 g capsule TAKE 4 CAPSULES (4 GRAMS TOTAL) BY MOUTHDAILY 07/09/23   Malva Limes, MD  oxyCODONE-acetaminophen (PERCOCET) 10-325 MG tablet TAKE 1 TABLET BY MOUTH EVERY 4 HOURS AS NEEDED FOR PAIN 09/13/23   Malva Limes, MD  OXYGEN Inhale 2 L into the lungs at bedtime as needed (for shortness of breath).    [provider]  pantoprazole (PROTONIX) 40 MG tablet Take 1 tablet (40 mg total) by mouth 2 (two) times daily for 14 days. 06/08/23 08/07/23  Celso Amy, PA-C  ranolazine (RANEXA) 1000 MG SR tablet Take 1 tablet (1,000 mg total) by mouth 2 (two) times daily. 08/05/23   Marcelino Duster, MD  sacubitril-valsartan (ENTRESTO) 24-26 MG Take 1 tablet by mouth 2 (two) times daily. Patient not taking: Reported on 08/26/2023 08/09/23   Arnetha Courser, MD  spironolactone (ALDACTONE) 25 MG tablet Take 1 tablet by mouth daily. 08/19/23 11/17/23  [provider]  traZODone (DESYREL) 150 MG tablet TAKE 1 TABLET BY MOUTH AT BEDTIME Patient taking differently:  Take 100 mg by mouth at bedtime. 11/24/22   Malva Limes, MD  venlafaxine XR (EFFEXOR-XR) 75 MG 24 hr capsule Take 75 mg by mouth daily with breakfast.    [provider]      VITAL SIGNS:  Blood pressure 91/63, pulse 100, temperature 97.6 F (36.4  C), temperature source Oral, resp. rate 11, height 5\' 7"  (1.702 m), weight 86.6 kg, SpO2 94%.  PHYSICAL EXAMINATION:  Physical Exam  GENERAL:  67 y.o.-year-old Caucasian male patient lying in the bed with no acute distress.  EYES: Pupils equal, round, reactive to light and accommodation. No scleral icterus. Extraocular muscles intact.  HEENT: Head atraumatic, normocephalic. Oropharynx and nasopharynx clear.  NECK:  Supple, no jugular venous distention. No thyroid enlargement, no tenderness.  LUNGS: Normal breath sounds bilaterally, no wheezing, rales,rhonchi or crepitation. No use of accessory muscles of respiration.  CARDIOVASCULAR: Regular rate and rhythm, S1, S2 normal. No murmurs, rubs, or gallops.  ABDOMEN: Soft, nondistended, nontender. Bowel sounds present. No organomegaly or mass.  EXTREMITIES: No pedal edema, cyanosis, or clubbing.  NEUROLOGIC: Cranial nerves II through XII are intact. Muscle strength 5/5 in all extremities. Sensation intact. Gait not checked.  PSYCHIATRIC: The patient is alert and oriented x 3.  Normal affect and good eye contact. SKIN: No obvious rash, lesion, or ulcer.   LABORATORY PANEL:   CBC Recent Labs  Lab 09/28/23 2204  WBC 8.7  HGB 17.4*  HCT 53.7*  PLT 218   ------------------------------------------------------------------------------------------------------------------  Chemistries  Recent Labs  Lab 09/28/23 2204  NA 140  K 3.5  CL 98  CO2 30  GLUCOSE 172*  BUN 17  CREATININE 1.95*  CALCIUM 9.8  AST 16  ALT 15  ALKPHOS 63  BILITOT 1.3*   ------------------------------------------------------------------------------------------------------------------  Cardiac Enzymes No results for input(s): "TROPONINI" in the last 168 hours. ------------------------------------------------------------------------------------------------------------------  RADIOLOGY:  DG Chest Portable 1 View  Result Date: 09/29/2023 CLINICAL DATA:   Status post fall. EXAM: PORTABLE CHEST 1 VIEW COMPARISON:  September 10, 2023 FINDINGS: Multiple sternal wires and vascular clips are seen. The heart size and mediastinal contours are within normal limits. A coronary artery stent is in place. Both lungs are clear. Multilevel degenerative changes seen throughout the thoracic spine. IMPRESSION: 1. Evidence of prior median sternotomy/CABG. 2. No acute cardiopulmonary disease. Electronically Signed   By: Aram Candela M.D.   On: 09/29/2023 00:19   DG Pelvis Portable  Result Date: 09/29/2023 CLINICAL DATA:  Status post fall. EXAM: PORTABLE PELVIS 1-2 VIEWS COMPARISON:  June 15, 2023 FINDINGS: There is no evidence of pelvic fracture or diastasis. No pelvic bone lesions are seen. Moderate to marked severity vascular calcification is noted. IMPRESSION: No acute osseous abnormality. Electronically Signed   By: Aram Candela M.D.   On: 09/29/2023 00:17      IMPRESSION AND PLAN:  Assessment and Plan: * Near syncope - The patient will be admitted to an observation medically monitored bed. - Will check orthostatics q 12 hours. - The patient will be gently hydrated with IV normal saline and monitored for arrhythmias. -Differential diagnoses would include likely orthostatic hypotension with volume depletion and dehydration neurally mediated syncope, cardiogenic, arrhythmias related, and less likely hypoglycemia.    AKI (acute kidney injury) (HCC) - This is likely prerenal due to volume depletion and dehydration as well as hypotension. - The patient will be hydrated with IV normal saline. - Will follow BMPs. - We will avoid nephrotoxins.  Essential hypertension - We will hold off  antihypertensives pending improvement of his BP.  Type 2 diabetes mellitus without complications (HCC) - The patient will be placed on supplemental coverage with NovoLog. - We will hold off metformin given AKI. - We will continue [place].  Coronary artery disease -  We will continue beta-blocker therapy as well as Imdur, Ranexa and as needed sublingual nitroglycerin.  Chronic atrial fibrillation with RVR (HCC) - Will obtain continue Eliquis and beta-blocker therapy.  Anxiety - We will continue Xanax.  Dyslipidemia - We will continue statin therapy.  Gout - We will continue allopurinol.   DVT prophylaxis: Eliquis Advanced Care Planning:  Code Status: The patient is DNR and DNI. Family Communication:  The plan of care was discussed in details with the patient (and family). I answered all questions. The patient agreed to proceed with the above mentioned plan. Further management will depend upon hospital course. Disposition Plan: Back to previous home environment Consults called: none. All the records are reviewed and case discussed with ED provider.  Status is: Observation  I certify that at the time of admission, it is my clinical judgment that the patient will require  hospital care extending less than 2 midnights.                            Dispo: The patient is from: Home              Anticipated d/c is to: Home              Patient currently is not medically stable to d/c.              Difficult to place patient: No  Hannah Beat M.D on 09/29/2023 at 12:28 AM  [facility]   From 7 PM-7 AM, contact night-coverage [email or web address]  CC: Primary care physician; Malva Limes, MD

## 2023-09-29 NOTE — Plan of Care (Signed)
  Problem: Clinical Measurements: Goal: Ability to maintain clinical measurements within normal limits will improve Outcome: Progressing Goal: Will remain free from infection Outcome: Progressing Goal: Diagnostic test results will improve Outcome: Progressing Goal: Respiratory complications will improve Outcome: Progressing Goal: Cardiovascular complication will be avoided Outcome: Progressing   Problem: Pain Management: Goal: General experience of comfort will improve Outcome: Progressing   Problem: Safety: Goal: Ability to remain free from injury will improve Outcome: Progressing

## 2023-09-29 NOTE — Assessment & Plan Note (Signed)
-   We will continue allopurinol 

## 2023-09-29 NOTE — Progress Notes (Signed)
Patient's blood pressure is 84/52 Map of 59. Orthostatic sitting is 69/56. Patient complained of dizziness while sitting. Also he only voided 100cc since 1am. Notified Dr. Arville Care of patient's current condition. Orders received for 250cc normal saline bolus.

## 2023-09-29 NOTE — ED Provider Notes (Addendum)
12:05 AM  Assumed care at shift change.  Patient here for weakness, lightheadedness.  Found to be hypotensive.  Could be secondary to changes in medications.  Labs show new AKI with creatinine of 1.95.  Does appear dry on exam.  Blood pressures improving with IV fluids.  Has chronic chest pain which is unchanged.  First troponin is at his baseline.  Second pending.  Doubt cardiogenic shock.  Will admit for observation, hydration.    Consulted and discussed patient's case with hospitalist, Dr. Arville Care.  I have recommended admission and consulting physician agrees and will place admission orders.  Patient (and family if present) agree with this plan.   I reviewed all nursing notes, vitals, pertinent previous records.  All labs, EKGs, imaging ordered have been independently reviewed and interpreted by myself.    CRITICAL CARE Performed by: Rochele Raring   Total critical care time: 30 minutes  Critical care time was exclusive of separately billable procedures and treating other patients.  Critical care was necessary to treat or prevent imminent or life-threatening deterioration.  Critical care was time spent personally by me on the following activities: development of treatment plan with patient and/or surrogate as well as nursing, discussions with consultants, evaluation of patient's response to treatment, examination of patient, obtaining history from patient or surrogate, ordering and performing treatments and interventions, ordering and review of laboratory studies, ordering and review of radiographic studies, pulse oximetry and re-evaluation of patient's condition.    Hersey Maclellan, Layla Maw, DO 09/29/23 [phone]

## 2023-09-29 NOTE — Assessment & Plan Note (Signed)
-   We will hold off antihypertensives pending improvement of his BP.

## 2023-09-29 NOTE — ED Notes (Signed)
Patient laying in bed no acute distress noted, respirations even and unlabored

## 2023-09-29 NOTE — Plan of Care (Signed)
  Problem: Health Behavior/Discharge Planning: Goal: Ability to manage health-related needs will improve Outcome: Not Progressing   Problem: Clinical Measurements: Goal: Ability to maintain clinical measurements within normal limits will improve Outcome: Progressing Goal: Respiratory complications will improve Outcome: Progressing   Problem: Activity: Goal: Risk for activity intolerance will decrease Outcome: Not Progressing

## 2023-09-29 NOTE — Assessment & Plan Note (Signed)
-   We will continue statin therapy. 

## 2023-09-29 NOTE — Progress Notes (Signed)
Patient is requesting pain medication. Notified Dr. Arville Care of the blood pressure of 99/62 MAP 75 and patient's request after education. MD gave orders to resume home medication of percocet 10-325.

## 2023-09-29 NOTE — Progress Notes (Addendum)
Patient was seen and examined at the bedside.   He complains of general weakness and dizziness.  Overall, he feels better today.  BP is better (90/62).  Lawrence [name] is a 67 y.o. Caucasian male with medical history significant for paroxysmal atrial fibrillation, bradycardia, anxiety, osteoarthritis, asthma, coronary artery disease, chronic chest pain, celiac disease, COPD, chronic hypoxemic respiratory failure, chronic low back pain, GERD, hypertension,, coronary artery disease s/p coronary stent, chronic diastolic and systolic CHF (echo in September 2024 showed EF estimated at 30 to 35%, grade 2 diastolic dysfunction).  He presented to the hospital with chest pain, general weakness and lightheadedness.  He fell about a day prior to admission.  His blood pressure was low at home.  He was recently started on carvedilol spironolactone.  He was found to have hypotension and AKI.  BP was as low as 69/56. Labs reviewed.  Troponin is 23 and 17.  Creatinine was 1.95 on admission.  Creatinine on 09/24/2023 was 1.02. Creatinine is trending down now 1.77. He has received more than 3 L of IV fluids thus far.  Discontinue IV fluids given low EF on recent echo. Continue to hold antihypertensives.  Discontinue isosorbide mononitrate and as needed nitroglycerin.   Repeat BMP tomorrow PT evaluation.

## 2023-09-30 ENCOUNTER — Other Ambulatory Visit: Payer: Self-pay

## 2023-09-30 LAB — PHOSPHORUS: Phosphorus: 4 mg/dL (ref 2.5–4.6)

## 2023-09-30 LAB — BASIC METABOLIC PANEL
Anion gap: 4 — ABNORMAL LOW (ref 5–15)
BUN: 21 mg/dL (ref 8–23)
CO2: 28 mmol/L (ref 22–32)
Calcium: 8.8 mg/dL — ABNORMAL LOW (ref 8.9–10.3)
Chloride: 104 mmol/L (ref 98–111)
Creatinine, Ser: 1.19 mg/dL (ref 0.61–1.24)
GFR, Estimated: >60 mL/min (ref >60)
Glucose, Bld: 111 mg/dL — ABNORMAL HIGH (ref 70–99)
Potassium: 4.2 mmol/L (ref 3.5–5.1)
Sodium: 136 mmol/L (ref 135–145)

## 2023-09-30 LAB — GLUCOSE, CAPILLARY
Glucose-Capillary: 125 mg/dL — ABNORMAL HIGH (ref 70–99)
Glucose-Capillary: 153 mg/dL — ABNORMAL HIGH (ref 70–99)

## 2023-09-30 LAB — MAGNESIUM: Magnesium: 1.8 mg/dL (ref 1.7–2.4)

## 2023-09-30 MED ORDER — CARVEDILOL 6.25 MG PO TABS: 3.1250 mg | ORAL_TABLET | Freq: Two times a day (BID) | ORAL | Status: DC

## 2023-09-30 MED ORDER — TRAZODONE HCL 100 MG PO TABS: 100.0000 mg | ORAL_TABLET | Freq: Every day | ORAL | 0 refills | Status: DC

## 2023-09-30 NOTE — Discharge Instructions (Addendum)
Keep log off BP at home. If you feel dizzy and lightheaded hold your BP meds on that day F/u [facility] on your appt Keep log of your sugars at home Your lisinopril has been discontinued for now due to soft BP

## 2023-09-30 NOTE — Progress Notes (Signed)
IV removed without complications. Discharge education completed. Patient's grandson brought patient clothes. Medications returned to patient from the pharmacy. Patient wheeled to the medical mall exit to be discharged to the care of family, in stable condition.  Lawrence [name]

## 2023-09-30 NOTE — Patient Outreach (Signed)
Duplicate encounter:Please Disregard

## 2023-09-30 NOTE — TOC Initial Note (Signed)
Transition of Care [facility]) - Initial/Assessment Note    Patient Details  Name: Lawrence [name] MRN: [id number] Date of Birth: [birth year]  Transition of Care [facility]) CM/SW Contact:    Margarito Liner, LCSW Phone Number: 09/30/2023, 10:29 AM  Clinical Narrative:  Readmission prevention screen complete. CSW met with patient. No supports at bedside. CSW introduced role and explained that discharge planning would be discussed. PCP is [name], MD. He uses Medicaid Transportation to get to his appts. He uses [facility]. No issues obtaining medications. Patient lives home with his grandson. He was discharged from [facility] on 11/11. PT recommending no follow up at this time. Patient has a rollator, BSC, built-in shower seat, and 2 L chronic oxygen through [employer]. No further concerns. CSW encouraged patient to contact CSW as needed. CSW will continue to follow patient for support and facilitate return home once stable. His friend will drive him home at discharge.                Expected Discharge Plan: Home/Self Care Barriers to Discharge: Continued Medical Work up   Patient Goals and CMS Choice            Expected Discharge Plan and Services     Post Acute Care Choice: NA Living arrangements for the past 2 months: Apartment                                      Prior Living Arrangements/Services Living arrangements for the past 2 months: Apartment Lives with:: Relatives Patient language and need for interpreter reviewed:: Yes Do you feel safe going back to the place where you live?: Yes      Need for Family Participation in Patient Care: Yes (Comment) Care giver support system in place?: Yes (comment) Current home services: DME Criminal Activity/Legal Involvement Pertinent to Current Situation/Hospitalization: No - Comment as needed  Activities of Daily Living   ADL Screening (condition at time of admission) Independently performs ADLs?:  No Does the patient have a NEW difficulty with bathing/dressing/toileting/self-feeding that is expected to last >3 days?: No Does the patient have a NEW difficulty with getting in/out of bed, walking, or climbing stairs that is expected to last >3 days?: Yes (Initiates electronic notice to provider for possible PT consult) Does the patient have a NEW difficulty with communication that is expected to last >3 days?: No Is the patient deaf or have difficulty hearing?: No Does the patient have difficulty seeing, even when wearing glasses/contacts?: No Does the patient have difficulty concentrating, remembering, or making decisions?: No  Permission Sought/Granted                  Emotional Assessment Appearance:: Appears stated age Attitude/Demeanor/Rapport: Engaged, Gracious Affect (typically observed): Accepting, Appropriate, Calm, Pleasant Orientation: : Oriented to Self, Oriented to Place, Oriented to Situation, Oriented to  Time Alcohol / Substance Use: Not Applicable Psych Involvement: No (comment)  Admission diagnosis:  Weakness [R53.1] AKI (acute kidney injury) (HCC) [N17.9] Near syncope [R55] Hypotension, unspecified hypotension type [I95.9] Patient Active Problem List   Diagnosis Date Noted   Near syncope 09/29/2023   Dyslipidemia 09/29/2023   Anxiety 09/29/2023   Chronic atrial fibrillation with RVR (HCC) 09/29/2023   Coronary artery disease 09/29/2023   Nonspecific chest pain 08/05/2023   NSTEMI (non-ST elevated myocardial infarction) (HCC) 08/04/2023   Diabetes mellitus without complication (HCC) 08/04/2023  HTN (hypertension) 08/04/2023   CAD (coronary artery disease) 08/04/2023   FTT (failure to thrive) in adult 07/21/2023   Hospital discharge follow-up 07/21/2023   Acute bronchitis with COPD (HCC) 07/21/2023   Diabetes mellitus (HCC) 07/21/2023   AKI (acute kidney injury) (HCC) 07/15/2023   PAD (peripheral artery disease) (HCC) 07/11/2023   Type 2 diabetes  mellitus without complications (HCC) 05/17/2023   Essential hypertension 05/17/2023   Gout 02/22/2023   Chronic diastolic CHF (congestive heart failure) (HCC)    Hypertensive urgency 04/27/2021   Atherosclerosis of aorta (HCC) 12/16/2020   Syncope 12/11/2020   Polycythemia 10/05/2020   Acute on chronic congestive heart failure (HCC) 04/18/2018   Paroxysmal atrial fibrillation (HCC) 12/23/2015   OSA (obstructive sleep apnea) 12/10/2015   Depression with anxiety 11/07/2015   BPH (benign prostatic hyperplasia) 08/01/2015   Achalasia 07/24/2014   HLD (hyperlipidemia) 04/09/2013   PCP:  Malva Limes, MD Pharmacy:   [facility] - [place], [place] - [address] [place] [address] Phone: [phone] Fax: [phone]  [facility] - [place], [place] - [address] [place] [address] Phone: [phone] Fax: [phone]  [id number] - [place], [place] - [address] AT [facility] [address] [place] [address] Phone: [phone] Fax: [phone]     Social Determinants of Health (SDOH) Social History: SDOH Screenings   Food Insecurity: No Food Insecurity (09/29/2023)  Recent Concern: Food Insecurity - Food Insecurity Present (08/23/2023)  Housing: Low Risk  (09/29/2023)  Recent Concern: Housing - Medium Risk (08/13/2023)  Transportation Needs: No Transportation Needs (09/29/2023)  Utilities: Not At Risk (09/29/2023)  Alcohol Screen: Low Risk  (03/10/2023)  Depression (PHQ2-9): Medium Risk (09/03/2023)  Financial Resource Strain: Low Risk  (08/18/2023)   Received from [facility]  Physical Activity: Inactive (03/10/2023)  Social Connections: Moderately Isolated (08/06/2023)  Stress: No Stress Concern Present (03/10/2023)  Tobacco Use: Medium Risk (09/28/2023)  Health Literacy: Adequate Health Literacy (08/06/2023)   SDOH Interventions: Food  Insecurity Interventions: Intervention Not Indicated   Readmission Risk Interventions    09/30/2023   10:28 AM 08/05/2023   10:11 AM 07/13/2023   10:52 AM  Readmission Risk Prevention Plan  Transportation Screening Complete Complete Complete  Medication Review (RN Care Manager) Complete Complete Complete  PCP or Specialist appointment within 3-5 days of discharge Complete Complete Complete  SW Recovery Care/Counseling Consult Complete Complete Complete  Palliative Care Screening Not Applicable Not Applicable Not Applicable  Skilled Nursing Facility Not Applicable Not Applicable Not Applicable

## 2023-09-30 NOTE — Plan of Care (Signed)
Adequate for discharge.

## 2023-09-30 NOTE — TOC Transition Note (Signed)
Transition of Care [facility]) - CM/SW Discharge Note   Patient Details  Name: Lawrence [name] MRN: [id number] Date of Birth: [birth year]  [facility]) CM/SW Contact:  Margarito Liner, LCSW Phone Number: 09/30/2023, 1:14 PM   Clinical Narrative:  Patient has orders to discharge home today. No further concerns. CSW signing off.   Final next level of care: Home/Self Care Barriers to Discharge: Barriers Resolved   Patient Goals and CMS Choice      Discharge Placement                  Patient to be transferred to facility by: Friend   Patient and family notified of of transfer: 09/30/23  Discharge Plan and Services Additional resources added to the After Visit Summary for       Post Acute Care Choice: NA                               Social Determinants of Health (SDOH) Interventions SDOH Screenings   Food Insecurity: No Food Insecurity (09/29/2023)  Recent Concern: Food Insecurity - Food Insecurity Present (08/23/2023)  Housing: Low Risk  (09/29/2023)  Recent Concern: Housing - Medium Risk (08/13/2023)  Transportation Needs: No Transportation Needs (09/29/2023)  Utilities: Not At Risk (09/29/2023)  Alcohol Screen: Low Risk  (03/10/2023)  Depression (PHQ2-9): Medium Risk (09/03/2023)  Financial Resource Strain: Low Risk  (08/18/2023)   Received from [facility]  Physical Activity: Inactive (03/10/2023)  Social Connections: Moderately Isolated (08/06/2023)  Stress: No Stress Concern Present (03/10/2023)  Tobacco Use: Medium Risk (09/28/2023)  Health Literacy: Adequate Health Literacy (08/06/2023)     Readmission Risk Interventions    09/30/2023   10:28 AM 08/05/2023   10:11 AM 07/13/2023   10:52 AM  Readmission Risk Prevention Plan  Transportation Screening Complete Complete Complete  Medication Review (RN Care Manager) Complete Complete Complete  PCP or Specialist appointment within 3-5 days of discharge Complete Complete Complete  SW Recovery  Care/Counseling Consult Complete Complete Complete  Palliative Care Screening Not Applicable Not Applicable Not Applicable  Skilled Nursing Facility Not Applicable Not Applicable Not Applicable

## 2023-09-30 NOTE — Patient Outreach (Signed)
  Care Management   Visit Note  09/30/2023 Name: Lawrence [name] MRN: [id number] DOB: Jan 05, 1956  Subjective: Lawrence [name] is a 67 y.o. year old male who is a primary care patient of [name], Demetrios Isaacs, MD. The Care Management team was consulted for assistance.      Brief outreach with [name]. He was recently admitted and treated at [facility] due to weakness. He was discharged today. Reports just returning home and settling in at the time of the call. Requested to complete outreach on tomorrow. He is aware of worsening symptoms and indications for seeking immediate medical attention.  PLAN:  Will follow up as requested tomorrow.   Katina [facility]  [facility] RN Care Manager Direct Dial: [phone] Website: [email or web address]

## 2023-10-01 ENCOUNTER — Other Ambulatory Visit: Payer: Self-pay

## 2023-10-01 ENCOUNTER — Emergency Department: Payer: Medicare HMO

## 2023-10-01 ENCOUNTER — Telehealth: Payer: Self-pay

## 2023-10-01 ENCOUNTER — Observation Stay
Admission: EM | Admit: 2023-10-01 | Discharge: 2023-10-03 | Disposition: A | Discharge status: Home / Self Care | Payer: Medicare HMO | Attending: Internal Medicine | Admitting: Internal Medicine

## 2023-10-01 ENCOUNTER — Telehealth: Payer: Self-pay | Admitting: *Deleted

## 2023-10-01 DIAGNOSIS — M109 Gout, unspecified: Secondary | ICD-10-CM | POA: Insufficient documentation

## 2023-10-01 DIAGNOSIS — J4489 Other specified chronic obstructive pulmonary disease: Secondary | ICD-10-CM | POA: Insufficient documentation

## 2023-10-01 DIAGNOSIS — R0789 Other chest pain: Secondary | ICD-10-CM | POA: Diagnosis not present

## 2023-10-01 DIAGNOSIS — J9611 Chronic respiratory failure with hypoxia: Secondary | ICD-10-CM | POA: Diagnosis present

## 2023-10-01 DIAGNOSIS — Z95820 Peripheral vascular angioplasty status with implants and grafts: Secondary | ICD-10-CM | POA: Diagnosis not present

## 2023-10-01 DIAGNOSIS — I5042 Chronic combined systolic (congestive) and diastolic (congestive) heart failure: Secondary | ICD-10-CM | POA: Diagnosis present

## 2023-10-01 DIAGNOSIS — E119 Type 2 diabetes mellitus without complications: Secondary | ICD-10-CM

## 2023-10-01 DIAGNOSIS — I110 Hypertensive heart disease with heart failure: Secondary | ICD-10-CM | POA: Insufficient documentation

## 2023-10-01 DIAGNOSIS — I517 Cardiomegaly: Secondary | ICD-10-CM | POA: Diagnosis not present

## 2023-10-01 DIAGNOSIS — Z6829 Body mass index (BMI) 29.0-29.9, adult: Secondary | ICD-10-CM | POA: Insufficient documentation

## 2023-10-01 DIAGNOSIS — G4733 Obstructive sleep apnea (adult) (pediatric): Secondary | ICD-10-CM | POA: Diagnosis not present

## 2023-10-01 DIAGNOSIS — K449 Diaphragmatic hernia without obstruction or gangrene: Secondary | ICD-10-CM | POA: Insufficient documentation

## 2023-10-01 DIAGNOSIS — R531 Weakness: Secondary | ICD-10-CM | POA: Insufficient documentation

## 2023-10-01 DIAGNOSIS — I482 Chronic atrial fibrillation, unspecified: Secondary | ICD-10-CM | POA: Diagnosis not present

## 2023-10-01 DIAGNOSIS — R778 Other specified abnormalities of plasma proteins: Secondary | ICD-10-CM | POA: Diagnosis not present

## 2023-10-01 DIAGNOSIS — J9811 Atelectasis: Secondary | ICD-10-CM | POA: Diagnosis not present

## 2023-10-01 DIAGNOSIS — Z85828 Personal history of other malignant neoplasm of skin: Secondary | ICD-10-CM | POA: Insufficient documentation

## 2023-10-01 DIAGNOSIS — Z955 Presence of coronary angioplasty implant and graft: Secondary | ICD-10-CM | POA: Insufficient documentation

## 2023-10-01 DIAGNOSIS — Z79899 Other long term (current) drug therapy: Secondary | ICD-10-CM | POA: Diagnosis not present

## 2023-10-01 DIAGNOSIS — Z7984 Long term (current) use of oral hypoglycemic drugs: Secondary | ICD-10-CM | POA: Insufficient documentation

## 2023-10-01 DIAGNOSIS — Z1152 Encounter for screening for COVID-19: Secondary | ICD-10-CM | POA: Diagnosis not present

## 2023-10-01 DIAGNOSIS — E663 Overweight: Secondary | ICD-10-CM | POA: Insufficient documentation

## 2023-10-01 DIAGNOSIS — I10 Essential (primary) hypertension: Secondary | ICD-10-CM | POA: Diagnosis not present

## 2023-10-01 DIAGNOSIS — Z7901 Long term (current) use of anticoagulants: Secondary | ICD-10-CM | POA: Diagnosis not present

## 2023-10-01 DIAGNOSIS — I2510 Atherosclerotic heart disease of native coronary artery without angina pectoris: Secondary | ICD-10-CM

## 2023-10-01 DIAGNOSIS — R079 Chest pain, unspecified: Principal | ICD-10-CM | POA: Diagnosis present

## 2023-10-01 DIAGNOSIS — I5022 Chronic systolic (congestive) heart failure: Secondary | ICD-10-CM | POA: Diagnosis present

## 2023-10-01 DIAGNOSIS — J9601 Acute respiratory failure with hypoxia: Secondary | ICD-10-CM | POA: Diagnosis not present

## 2023-10-01 DIAGNOSIS — Z87891 Personal history of nicotine dependence: Secondary | ICD-10-CM | POA: Diagnosis not present

## 2023-10-01 DIAGNOSIS — R0602 Shortness of breath: Secondary | ICD-10-CM | POA: Diagnosis not present

## 2023-10-01 DIAGNOSIS — F419 Anxiety disorder, unspecified: Secondary | ICD-10-CM

## 2023-10-01 LAB — BASIC METABOLIC PANEL
Anion gap: 5 (ref 5–15)
BUN: 17 mg/dL (ref 8–23)
CO2: 28 mmol/L (ref 22–32)
Calcium: 9.1 mg/dL (ref 8.9–10.3)
Chloride: 105 mmol/L (ref 98–111)
Creatinine, Ser: 0.77 mg/dL (ref 0.61–1.24)
GFR, Estimated: >60 mL/min (ref >60)
Glucose, Bld: 103 mg/dL — ABNORMAL HIGH (ref 70–99)
Potassium: 4.3 mmol/L (ref 3.5–5.1)
Sodium: 138 mmol/L (ref 135–145)

## 2023-10-01 LAB — CBC WITH DIFFERENTIAL/PLATELET
Abs Immature Granulocytes: 0.05 10*3/uL (ref 0.00–0.07)
Basophils Absolute: 0 10*3/uL (ref 0.0–0.1)
Basophils Relative: 1 %
Eosinophils Absolute: 0.2 10*3/uL (ref 0.0–0.5)
Eosinophils Relative: 3 %
HCT: 51.4 % (ref 39.0–52.0)
Hemoglobin: 16.5 g/dL (ref 13.0–17.0)
Immature Granulocytes: 1 %
Lymphocytes Relative: 21 %
Lymphs Abs: 1.3 10*3/uL (ref 0.7–4.0)
MCH: 29.6 pg (ref 26.0–34.0)
MCHC: 32.1 g/dL (ref 30.0–36.0)
MCV: 92.1 fL (ref 80.0–100.0)
Monocytes Absolute: 0.4 10*3/uL (ref 0.1–1.0)
Monocytes Relative: 7 %
Neutro Abs: 4.1 10*3/uL (ref 1.7–7.7)
Neutrophils Relative %: 67 %
Platelets: 175 10*3/uL (ref 150–400)
RBC: 5.58 MIL/uL (ref 4.22–5.81)
RDW: 14.8 % (ref 11.5–15.5)
WBC: 6 10*3/uL (ref 4.0–10.5)
nRBC: 0 % (ref 0.0–0.2)

## 2023-10-01 LAB — RESP PANEL BY RT-PCR (RSV, FLU A&B, COVID)  RVPGX2
Influenza A by PCR: NEGATIVE
Influenza B by PCR: NEGATIVE
Resp Syncytial Virus by PCR: NEGATIVE
SARS Coronavirus 2 by RT PCR: NEGATIVE

## 2023-10-01 LAB — BRAIN NATRIURETIC PEPTIDE: B Natriuretic Peptide: 380.1 pg/mL — ABNORMAL HIGH (ref 0.0–100.0)

## 2023-10-01 LAB — TROPONIN I (HIGH SENSITIVITY)
Troponin I (High Sensitivity): 28 ng/L — ABNORMAL HIGH (ref <18)
Troponin I (High Sensitivity): 34 ng/L — ABNORMAL HIGH (ref <18)

## 2023-10-01 MED ORDER — SPIRONOLACTONE 25 MG PO TABS
25.0000 mg | ORAL_TABLET | Freq: Every day | ORAL | Status: DC
  Administered 2023-10-02 – 2023-10-03 (×2): 25 mg via ORAL
  Filled 2023-10-01 (×2): qty 1

## 2023-10-01 MED ORDER — VENLAFAXINE HCL ER 75 MG PO CP24
75.0000 mg | ORAL_CAPSULE | Freq: Every day | ORAL | Status: DC
  Administered 2023-10-02 – 2023-10-03 (×2): 75 mg via ORAL
  Filled 2023-10-01 (×2): qty 1

## 2023-10-01 MED ORDER — MORPHINE SULFATE (PF) 4 MG/ML IV SOLN
4.0000 mg | Freq: Once | INTRAVENOUS | Status: AC
  Administered 2023-10-01: 4 mg via INTRAVENOUS
  Filled 2023-10-01: qty 1

## 2023-10-01 MED ORDER — ONDANSETRON HCL 4 MG/2ML IJ SOLN
4.0000 mg | Freq: Four times a day (QID) | INTRAMUSCULAR | Status: DC | PRN
  Administered 2023-10-02: 4 mg via INTRAVENOUS
  Filled 2023-10-01: qty 2

## 2023-10-01 MED ORDER — FENTANYL CITRATE PF 50 MCG/ML IJ SOSY
50.0000 ug | PREFILLED_SYRINGE | Freq: Once | INTRAMUSCULAR | Status: AC
  Administered 2023-10-01: 50 ug via INTRAVENOUS
  Filled 2023-10-01: qty 1

## 2023-10-01 MED ORDER — ATORVASTATIN CALCIUM 80 MG PO TABS
80.0000 mg | ORAL_TABLET | Freq: Every day | ORAL | Status: DC
  Administered 2023-10-02: 80 mg via ORAL
  Filled 2023-10-01: qty 1

## 2023-10-01 MED ORDER — LIDOCAINE 5 % EX PTCH
1.0000 | MEDICATED_PATCH | CUTANEOUS | Status: DC
  Administered 2023-10-02 – 2023-10-03 (×2): 1 via TRANSDERMAL
  Filled 2023-10-01 (×2): qty 1

## 2023-10-01 MED ORDER — ACETAMINOPHEN 325 MG PO TABS: 325.0000 mg | ORAL_TABLET | ORAL | Status: DC | PRN

## 2023-10-01 MED ORDER — VITAMIN B-12 1000 MCG PO TABS
1000.0000 ug | ORAL_TABLET | Freq: Every day | ORAL | Status: DC
  Administered 2023-10-02 – 2023-10-03 (×2): 1000 ug via ORAL
  Filled 2023-10-01 (×2): qty 1

## 2023-10-01 MED ORDER — DAPAGLIFLOZIN PROPANEDIOL 10 MG PO TABS
10.0000 mg | ORAL_TABLET | Freq: Every day | ORAL | Status: DC
  Administered 2023-10-02 – 2023-10-03 (×2): 10 mg via ORAL
  Filled 2023-10-01 (×2): qty 1

## 2023-10-01 MED ORDER — NITROGLYCERIN 0.4 MG SL SUBL
0.4000 mg | SUBLINGUAL_TABLET | SUBLINGUAL | Status: DC | PRN
  Administered 2023-10-02: 0.4 mg via SUBLINGUAL
  Filled 2023-10-01: qty 1

## 2023-10-01 MED ORDER — ACETAMINOPHEN 325 MG PO TABS: 650.0000 mg | ORAL_TABLET | ORAL | Status: DC | PRN

## 2023-10-01 MED ORDER — BUDESON-GLYCOPYRROL-FORMOTEROL 160-9-4.8 MCG/ACT IN AERO: 2.0000 | INHALATION_SPRAY | Freq: Two times a day (BID) | RESPIRATORY_TRACT | Status: DC

## 2023-10-01 MED ORDER — MAGNESIUM HYDROXIDE 400 MG/5ML PO SUSP: 30.0000 mL | Freq: Every day | ORAL | Status: DC | PRN

## 2023-10-01 MED ORDER — RANOLAZINE ER 500 MG PO TB12
1000.0000 mg | ORAL_TABLET | Freq: Two times a day (BID) | ORAL | Status: DC
  Administered 2023-10-02 – 2023-10-03 (×3): 1000 mg via ORAL
  Filled 2023-10-01 (×4): qty 2

## 2023-10-01 MED ORDER — TRAZODONE HCL 50 MG PO TABS
25.0000 mg | ORAL_TABLET | Freq: Every evening | ORAL | Status: DC | PRN
  Administered 2023-10-02: 25 mg via ORAL
  Filled 2023-10-01: qty 1

## 2023-10-01 MED ORDER — ISOSORBIDE MONONITRATE ER 60 MG PO TB24
120.0000 mg | ORAL_TABLET | Freq: Every day | ORAL | Status: DC
  Administered 2023-10-02 – 2023-10-03 (×2): 120 mg via ORAL
  Filled 2023-10-01 (×2): qty 2

## 2023-10-01 MED ORDER — CARVEDILOL 3.125 MG PO TABS
3.1250 mg | ORAL_TABLET | Freq: Two times a day (BID) | ORAL | Status: DC
  Administered 2023-10-02 – 2023-10-03 (×3): 3.125 mg via ORAL
  Filled 2023-10-01 (×3): qty 1

## 2023-10-01 MED ORDER — APIXABAN 5 MG PO TABS
5.0000 mg | ORAL_TABLET | Freq: Two times a day (BID) | ORAL | Status: DC
  Administered 2023-10-02 – 2023-10-03 (×3): 5 mg via ORAL
  Filled 2023-10-01 (×3): qty 1

## 2023-10-01 MED ORDER — OMEGA-3-ACID ETHYL ESTERS 1 G PO CAPS
1.0000 g | ORAL_CAPSULE | Freq: Every day | ORAL | Status: DC
  Administered 2023-10-02 – 2023-10-03 (×2): 1 g via ORAL
  Filled 2023-10-01 (×2): qty 1

## 2023-10-01 MED ORDER — ALPRAZOLAM 0.5 MG PO TABS
1.0000 mg | ORAL_TABLET | Freq: Three times a day (TID) | ORAL | Status: DC | PRN
  Administered 2023-10-02: 1 mg via ORAL
  Filled 2023-10-01: qty 2

## 2023-10-01 MED ORDER — CLONAZEPAM 0.5 MG PO TABS
0.5000 mg | ORAL_TABLET | Freq: Two times a day (BID) | ORAL | Status: DC
  Administered 2023-10-02 – 2023-10-03 (×3): 0.5 mg via ORAL
  Filled 2023-10-01 (×3): qty 1

## 2023-10-01 MED ORDER — OXYCODONE-ACETAMINOPHEN 10-325 MG PO TABS: 1.0000 | ORAL_TABLET | ORAL | Status: DC | PRN

## 2023-10-01 MED ORDER — NITROGLYCERIN 2 % TD OINT
1.0000 [in_us] | TOPICAL_OINTMENT | Freq: Once | TRANSDERMAL | Status: AC
  Administered 2023-10-01: 1 [in_us] via TOPICAL
  Filled 2023-10-01: qty 1

## 2023-10-01 MED ORDER — SODIUM CHLORIDE 0.9 % IV SOLN
INTRAVENOUS | Status: DC
Start: 2023-10-01 — End: 2023-10-02

## 2023-10-01 MED ORDER — METHOCARBAMOL 500 MG PO TABS: 500.0000 mg | ORAL_TABLET | Freq: Three times a day (TID) | ORAL | Status: DC | PRN

## 2023-10-01 MED ORDER — DICYCLOMINE HCL 10 MG PO CAPS: 10.0000 mg | ORAL_CAPSULE | Freq: Three times a day (TID) | ORAL | Status: DC | PRN

## 2023-10-01 MED ORDER — POLYSACCHARIDE IRON COMPLEX 150 MG PO CAPS
150.0000 mg | ORAL_CAPSULE | Freq: Every day | ORAL | Status: DC
  Administered 2023-10-02 – 2023-10-03 (×2): 150 mg via ORAL
  Filled 2023-10-01 (×2): qty 1

## 2023-10-01 MED ORDER — MORPHINE SULFATE (PF) 2 MG/ML IV SOLN
2.0000 mg | INTRAVENOUS | Status: DC | PRN
  Administered 2023-10-02: 2 mg via INTRAVENOUS
  Filled 2023-10-01: qty 1

## 2023-10-01 MED ORDER — TRAZODONE HCL 100 MG PO TABS
100.0000 mg | ORAL_TABLET | Freq: Every day | ORAL | Status: DC
  Administered 2023-10-02: 100 mg via ORAL
  Filled 2023-10-01: qty 1

## 2023-10-01 MED ORDER — LORATADINE 10 MG PO TABS
10.0000 mg | ORAL_TABLET | Freq: Every day | ORAL | Status: DC
  Administered 2023-10-02 – 2023-10-03 (×2): 10 mg via ORAL
  Filled 2023-10-01 (×2): qty 1

## 2023-10-01 MED ORDER — PANTOPRAZOLE SODIUM 40 MG PO TBEC
40.0000 mg | DELAYED_RELEASE_TABLET | Freq: Two times a day (BID) | ORAL | Status: DC
  Administered 2023-10-02 – 2023-10-03 (×3): 40 mg via ORAL
  Filled 2023-10-01 (×3): qty 1

## 2023-10-01 MED ORDER — OXYCODONE HCL 5 MG PO TABS
10.0000 mg | ORAL_TABLET | ORAL | Status: DC | PRN
  Administered 2023-10-01 – 2023-10-03 (×2): 10 mg via ORAL
  Filled 2023-10-01 (×3): qty 2

## 2023-10-01 NOTE — Progress Notes (Signed)
  Care Coordination  Outreach Note  10/01/2023 Name: ALFONZO OVERMAN MRN: [id number] DOB: 1955/11/28   Care Coordination Outreach Attempts: An unsuccessful telephone outreach was attempted today to offer the patient information about available care coordination services.  Follow Up Plan:  Additional outreach attempts will be made to offer the patient care coordination information and services.   Encounter Outcome:  No Answer  Burman Nieves, CCMA Care Coordination Care Guide Direct Dial: [phone]

## 2023-10-01 NOTE — ED Triage Notes (Signed)
Pt. To ED for left sided CP/pressure with dizziness and weakness all day, increasing in severity x1 hour. Pt. Denies n/v/d.

## 2023-10-01 NOTE — Patient Outreach (Signed)
Care Management  Transitions of Care Program Transitions of Care Post-discharge week 2   10/01/2023 Name: Lawrence [name] MRN: [id number] DOB: 1956-09-02  Subjective: Lawrence [name] is a 67 y.o. year old male who is a primary care patient of [name], Demetrios Isaacs, MD. The Care Management team Engaged with patient Engaged with patient by telephone to assess and address transitions of care needs.   Consent to Services:  Patient was given information about care management services, agreed to services, and gave verbal consent to participate.   Assessment:  Date of Discharge: 09/30/23 Discharge Facility: [facility]) Type of Discharge: Inpatient Admission Primary Inpatient Discharge Diagnosis:: Chest Pain  SDOH Interventions    Flowsheet Row Telephone from 10/01/2023 in [place] [facility] DEPARTMENT ED to Hosp-Admission (Discharged) from 09/28/2023 in [facility] Telephone from 08/23/2023 in [facility] Community Care Coordination Patient Outreach from 08/13/2023 in [place] [facility] DEPARTMENT Patient Outreach from 08/06/2023 in [place] POP[facility]ATION [facility]ALTH DEPARTMENT Telephone from 07/16/2023 in [facility] Community Care Coordination  SDOH Interventions        Food Insecurity Interventions AMB Referral Intervention Not Indicated Other (Comment)  [Referral to Child psychotherapist to inquire about MOMs meals previously set up.] -- Intervention Not Indicated Intervention Not Indicated  Housing Interventions Intervention Not Indicated -- -- Intervention Not Indicated Intervention Not Indicated --  Transportation Interventions Intervention Not Indicated -- -- -- Intervention Not Indicated Intervention Not Indicated  [reports uses medicaid transportation for provider appointments]  Utilities Interventions Intervention Not Indicated -- -- -- Intervention Not Indicated --  Alcohol Usage Interventions  -- -- -- -- Intervention Not Indicated (Score <7) --  Social Connections Interventions -- -- -- -- Intervention Not Indicated --  Health Literacy Interventions -- -- -- -- Intervention Not Indicated --        Goals Addressed             This Visit's Progress    Patient Stated       Current Barriers:  Knowledge Deficits related to plan of care for management of HTN  Chronic Disease Management support and education needs related to HTN   RNCM Clinical Goal(s):  Patient will work with the Care Management team over the next 30 days to address Transition of Care Barriers: Medication Management Diet/Nutrition/Food Resources Support at home Provider appointments through collaboration with Medical illustrator, provider, and care team.   Interventions: Evaluation of current treatment plan related to  self management and patient's adherence to plan as established by provider   Hypertension Interventions:  (Status:  New goal.) Short Term Goal Last practice recorded BP readings:  BP Readings from Last 3 Encounters:  09/30/23 (!) 100/57  09/24/23 (!) 180/110  09/10/23 127/73   Most recent eGFR/CrCl:  Lab Results  Component Value Date   EGFR 80 09/03/2023    No components found for: "CRCL"  Evaluation of current treatment plan related to hypertension self management and patient's adherence to plan as established by provider Reviewed medications with patient and discussed importance of compliance Discussed plans with patient for ongoing care management follow up and provided patient with direct contact information for care management team Advised patient, providing education and rationale, to monitor blood pressure daily and record, calling PCP for findings outside established parameters Screening for signs and symptoms of depression related to chronic disease state  Assessed social determinant of health barriers  Patient Goals/Self-Care Activities: Participate in Transition of Care  Program/Attend TOC scheduled calls Notify RN Care Manager of TOC call rescheduling needs Take all medications as prescribed Attend all scheduled provider appointments Call provider office for new concerns or questions   Follow Up Plan:  Telephone follow up appointment with care management team member scheduled for:  Wednesday November 20th at [facility] the patient for initial [facility] phone call. The has utilized the ED 23 times this year. The visits are usually related to chest pain and lightheadedness. He takes his blood pressure three times a day or more. While on the call the patient's BP is 155/88. He stated that his blood pressure earlier had been 144/101. The patient had a diagnosis and medication therapy related to anxiety. He admits to some depression and has been anxious for over 15 years. He is receptive to talk therapy and RNCM will make a referral for [facility]. He also reports some food insecurities. Discussed MOW's with him. He reports missing a phone call from them and called them back with no success. He does not drive but uses Medicaid transportation for doctor's appointments and he has a neighbor who takes him to the store. His grandson Lawrence [name] lives with him although Lawrence [name] provides no financial support and he is there to supervise the patient. The patient's benefits were reviewed regarding Transportation and food assistance as the patient reports he has Medicaid. He states he is not eligible for EBT because he makes too much money. Provided the patient with my contact number to call for questions and concerns.  [name], RN Medical illustrator [facility] [phone]

## 2023-10-01 NOTE — ED Notes (Signed)
CCMD called, pt. On cont. Cardiac monitoring.

## 2023-10-01 NOTE — ED Provider Notes (Addendum)
[facility] Provider Note    Event Date/Time   First MD Initiated Contact with Patient 10/01/23 1714     (approximate)   History   Chest Pain   HPI  Lawrence [name] is a 67 y.o. male with a history of paroxysmal A-fib, bradycardia, CAD, chronic chest pain, CHF, COPD, chronic respiratory failure, hypertension, and celiac disease who presents with chest pain and generalized weakness.  The patient was just admitted 2 days ago and discharged yesterday; he states that he feels just as bad as he did when he left the hospital yesterday.  The patient endorses shortness of breath.  He denies any cough or fever.  He has had some chills.  He denies any vomiting or diarrhea and states that he has been able to eat.  I reviewed the past medical records including the hospitalist discharge summary from yesterday; the patient presented with near syncope and lightheadedness.  He was diagnosed with AKI and dehydration and was treated with fluids.  Previously has been seen in the ED numerous times for acute on chronic chest pain, lightheadedness, and falls including on 10/26, 9/25, and an admission on 9/21.   Physical Exam   Triage Vital Signs: ED Triage Vitals  Encounter Vitals Group     BP      Systolic BP Percentile      Diastolic BP Percentile      Pulse      Resp      Temp      Temp src      SpO2      Weight      Height      Head Circumference      Peak Flow      Pain Score      Pain Loc      Pain Education      Exclude from Growth Chart     Most recent vital signs: Vitals:   10/01/23 2230 10/01/23 2300  BP: (!) 147/88 (!) 134/96  Pulse: (!) 40 75  Resp:    Temp:    SpO2: 96% 97%     General: Awake, no distress.  CV:  Good peripheral perfusion.  Resp:  Normal effort.  Lung CTAB. Abd:  No distention.  Other:  Moist mucous membranes.  No peripheral edema.  5/5 motor strength and intact sensation all extremities.  Normal speech.  No facial  droop.   ED Results / Procedures / Treatments   Labs (all labs ordered are listed, but only abnormal results are displayed) Labs Reviewed  BASIC METABOLIC PANEL - Abnormal; Notable for the following components:      Result Value   Glucose, Bld 103 (*)    All other components within normal limits  BRAIN NATRIURETIC PEPTIDE - Abnormal; Notable for the following components:   B Natriuretic Peptide 380.1 (*)    All other components within normal limits  TROPONIN I (HIGH SENSITIVITY) - Abnormal; Notable for the following components:   Troponin I (High Sensitivity) 28 (*)    All other components within normal limits  TROPONIN I (HIGH SENSITIVITY) - Abnormal; Notable for the following components:   Troponin I (High Sensitivity) 34 (*)    All other components within normal limits  RESP PANEL BY RT-PCR (RSV, FLU A&B, COVID)  RVPGX2  CBC WITH DIFFERENTIAL/PLATELET     EKG  ED ECG REPORT I, Dionne Bucy, the attending physician, personally viewed and interpreted this ECG.  Date: 10/01/2023 EKG Time:  1726 Rate: 80 Rhythm: Atrial fibrillation QRS Axis: normal Intervals: normal ST/T Wave abnormalities: Nonspecific ST abnormalities Narrative Interpretation: no evidence of acute ischemia; no significant change when compared to EKG of 09/28/2023    RADIOLOGY  Chest x-ray: I independently viewed and interpreted the images; there is no focal consolidation or edema  PROCEDURES:  Critical Care performed: No  Procedures   MEDICATIONS ORDERED IN ED: Medications  carvedilol (COREG) tablet 3.125 mg (has no administration in time range)  atorvastatin (LIPITOR) tablet 80 mg (has no administration in time range)  isosorbide mononitrate (IMDUR) 24 hr tablet 120 mg (has no administration in time range)  nitroGLYCERIN (NITROSTAT) SL tablet 0.4 mg (has no administration in time range)  omega-3 acid ethyl esters (LOVAZA) capsule 1 g (has no administration in time range)  ranolazine  (RANEXA) 12 hr tablet 1,000 mg (has no administration in time range)  spironolactone (ALDACTONE) tablet 25 mg (has no administration in time range)  ALPRAZolam (XANAX) tablet 1 mg (has no administration in time range)  traZODone (DESYREL) tablet 100 mg (has no administration in time range)  venlafaxine XR (EFFEXOR-XR) 24 hr capsule 75 mg (has no administration in time range)  dapagliflozin propanediol (FARXIGA) tablet 10 mg (has no administration in time range)  dicyclomine (BENTYL) capsule 10 mg (has no administration in time range)  pantoprazole (PROTONIX) EC tablet 40 mg (has no administration in time range)  cyanocobalamin (VITAMIN B12) tablet 1,000 mcg (has no administration in time range)  apixaban (ELIQUIS) tablet 5 mg (has no administration in time range)  iron polysaccharides (NIFEREX) capsule 150 mg (has no administration in time range)  clonazePAM (KLONOPIN) tablet 0.5 mg (has no administration in time range)  methocarbamol (ROBAXIN) tablet 500 mg (has no administration in time range)  Budeson-Glycopyrrol-Formoterol 160-9-4.8 MCG/ACT AERO 2 puff (has no administration in time range)  loratadine (CLARITIN) tablet 10 mg (has no administration in time range)  lidocaine (LIDODERM) 5 % 1 patch (has no administration in time range)  acetaminophen (TYLENOL) tablet 650 mg (has no administration in time range)  ondansetron (ZOFRAN) injection 4 mg (has no administration in time range)  0.9 %  sodium chloride infusion (has no administration in time range)  traZODone (DESYREL) tablet 25 mg (has no administration in time range)  magnesium hydroxide (MILK OF MAGNESIA) suspension 30 mL (has no administration in time range)  oxyCODONE (Oxy IR/ROXICODONE) immediate release tablet 10 mg (10 mg Oral Given 10/01/23 2214)    And  acetaminophen (TYLENOL) tablet 325 mg (has no administration in time range)  morphine (PF) 2 MG/ML injection 2 mg (has no administration in time range)  fentaNYL (SUBLIMAZE)  injection 50 mcg (50 mcg Intravenous Given 10/01/23 1801)  morphine (PF) 4 MG/ML injection 4 mg (4 mg Intravenous Given 10/01/23 1950)  nitroGLYCERIN (NITROGLYN) 2 % ointment 1 inch (1 inch Topical Given 10/01/23 1950)     IMPRESSION / MDM / ASSESSMENT AND PLAN / ED COURSE  I reviewed the triage vital signs and the nursing notes.  67 year old male with PMH as noted above presents with chest pain and generalized weakness.  He was just admitted 2 days ago and discharged yesterday with similar symptoms and was diagnosed with mild AKI at that time.  He has numerous prior ED visits for similar presentations.  On exam he is hypertensive otherwise normal vital signs.  Exam is otherwise unremarkable for acute findings.  EKG is nonischemic.  Differential diagnosis includes, but is not limited to, recurrent, persistent, or worsening AKI,  dehydration, electrolyte abnormality, other metabolic disturbance, exacerbation of chronic chest pain, CHF or COPD exacerbation, viral syndrome or other infection.  We will obtain chest x-ray, lab workup, and reassess.  Patient's presentation is most consistent with acute complicated illness / injury requiring diagnostic workup.  The patient is on the cardiac monitor to evaluate for evidence of arrhythmia and/or significant heart rate changes.  ----------------------------------------- 9:21 PM on 10/01/2023 -----------------------------------------   Chest x-ray shows no acute findings.  The patient's pain and blood pressure were improved with morphine and Nitropaste.  BNP is minimally elevated.  Creatinine is normalized from his recent admission.  Initial troponin was in the 20s, slightly higher than it was a few days ago.  Repeat is uptrending.  The patient states that his pain is starting to return.  Given his elevated CAD risk and the lab results and persistent pain I will admit for observation.  I consulted Dr. Arville Care from the hospitalist service; based on our  discussion he agrees to evaluate the patient for admission.  FINAL CLINICAL IMPRESSION(S) / ED DIAGNOSES   Final diagnoses:  Chest pain, unspecified type     Rx / DC Orders   ED Discharge Orders     None        Note:  This document was prepared using Dragon voice recognition software and may include unintentional dictation errors.    Dionne Bucy, MD 10/01/23 4332    Dionne Bucy, MD 10/01/23 [phone]

## 2023-10-01 NOTE — Patient Outreach (Signed)
  Care Management   Visit Note  10/01/2023 Name: Lawrence [name] MRN: [id number] DOB: 1956-02-08  Subjective: Lawrence [name] is a 67 y.o. year old male who is a primary care patient of [name], Demetrios Isaacs, MD. The Care Management team was consulted for assistance.      Contacted [name] to complete outreach. Call answered by his grandson [name]. Reports [name] was just getting up. Denies urgent concerns. Requested a call back around noon or later.   PLAN Will call back later today as requested.   [facility]  [facility], [facility] RN Care Manager Direct Dial: [phone] Website: [email or web address]

## 2023-10-01 NOTE — H&P (Signed)
[place]   PATIENT NAME: Lawrence [name]    MR#:  [id number]  DATE OF BIRTH:  [birth year]  DATE OF ADMISSION:  10/01/2023  PRIMARY CARE PHYSICIAN: Malva Limes, MD   Patient is coming from: Home  REQUESTING/REFERRING PHYSICIAN: Miki Kins, MD  CHIEF COMPLAINT:   Chief Complaint  Patient presents with   Chest Pain    HISTORY OF PRESENT ILLNESS:  Lawrence [name] is a 67 y.o. male with medical history significant for  atrial fibrillation, anxiety, osteoarthritis, asthma, coronary artery disease, celiac disease, COPD, GERD, hypertension,, coronary artery disease status post PCI and stents placement, who presented to the emergency room with acute onset of   ED Course: *** EKG as reviewed by me : *** Imaging: *** PAST MEDICAL HISTORY:   Past Medical History:  Diagnosis Date   A-fib (HCC)    Anemia    Anginal pain (HCC)    Anxiety    Arthritis    Asthma    CAD (coronary artery disease)    a. 2002 CABGx2 (LIMA->LAD, VG->VG->OM1);  b. 09/2012 DES->OM;  c. 03/2015 PTCA of LAD [facility]) in setting of atretic LIMA; d. 05/2015 Cath [facility]): nonobs dzs; e. 06/2015 Cath ([facility]): LM nl, LAD 45p/d ISR, 50d, D1/2 small, LCX 50p/d ISR, OM1 70ost, 30 ISR, VG->OM1 50ost, 46m, LIMA->LAD 99p/d - atretic, RCA dom, nl; f.cath 10/16: 40-50%(FFR 0.90) pLAD, 75% (FFR 0.77) mLAD s/p PCI/DES, oRCA 40% (FFR0.95)   Cancer (HCC)    SKIN CANCER ON BACK   Celiac disease    Chronic diastolic CHF (congestive heart failure) (HCC)    a. 06/2009 Echo: EF 60-65%, Gr 1 DD, triv AI, mildly dil LA, nl RV.   COPD (chronic obstructive pulmonary disease) (HCC)    a. Chronic bronchitis and emphysema.   DDD (degenerative disc disease), lumbar    Diverticulosis    Dysrhythmia    Essential hypertension    GERD (gastroesophageal reflux disease)    History of hiatal hernia    History of kidney stones    H/O   History of tobacco abuse    a. Quit 2014.   Myocardial infarction [facility]) 2002   4 STENTS    Pancreatitis    PSVT (paroxysmal supraventricular tachycardia) (HCC)    a. 10/2012 Noted on Zio Patch.   Sleep apnea    LOST CORD TO CPAP -ONLY 02 @ BEDTIME   Tubular adenoma of colon    Type II diabetes mellitus (HCC)     PAST SURGICAL HISTORY:   Past Surgical History:  Procedure Laterality Date   BYPASS GRAFT     CARDIAC CATHETERIZATION N/A 07/12/2015   rocedure: Left Heart Cath and Cors/Grafts Angiography;  Surgeon: Lyn Records, MD;  Location: [facility] INVASIVE CV LAB;  Service: Cardiovascular;  Laterality: N/A;   CARDIAC CATHETERIZATION Right 10/07/2015   Procedure: Left Heart Cath and Cors/Grafts Angiography;  Surgeon: Laurier Nancy, MD;  Location: [facility] INVASIVE CV LAB;  Service: Cardiovascular;  Laterality: Right;   CARDIAC CATHETERIZATION N/A 04/06/2016   Procedure: Left Heart Cath and Coronary Angiography;  Surgeon: Alwyn Pea, MD;  Location: [facility] INVASIVE CV LAB;  Service: Cardiovascular;  Laterality: N/A;   CARDIAC CATHETERIZATION  04/06/2016   Procedure: Bypass Graft Angiography;  Surgeon: Alwyn Pea, MD;  Location: [facility] INVASIVE CV LAB;  Service: Cardiovascular;;   CARDIAC CATHETERIZATION N/A 11/02/2016   Procedure: Left Heart Cath and Cors/Grafts Angiography and possible PCI;  Surgeon: Alwyn Pea, MD;  Location: [facility] INVASIVE CV LAB;  Service: Cardiovascular;  Laterality: N/A;   CARDIAC CATHETERIZATION N/A 11/02/2016   Procedure: Coronary Stent Intervention;  Surgeon: Alwyn Pea, MD;  Location: [facility] INVASIVE CV LAB;  Service: Cardiovascular;  Laterality: N/A;   CARDIOVERSION N/A 06/29/2022   Procedure: CARDIOVERSION;  Surgeon: Lamar Blinks, MD;  Location: [facility] ORS;  Service: Cardiovascular;  Laterality: N/A;   CHOLECYSTECTOMY     CIRCUMCISION N/A 06/09/2019   Procedure: CIRCUMCISION ADULT;  Surgeon: Sondra Come, MD;  Location: [facility] ORS;  Service: Urology;  Laterality: N/A;   COLONOSCOPY WITH PROPOFOL N/A 04/01/2018   Procedure: COLONOSCOPY  WITH PROPOFOL;  Surgeon: Scot Jun, MD;  Location: [facility] ENDOSCOPY;  Service: Endoscopy;  Laterality: N/A;   COLONOSCOPY WITH PROPOFOL N/A 05/01/2023   Procedure: COLONOSCOPY WITH PROPOFOL;  Surgeon: Toney Reil, MD;  Location: [facility] ENDOSCOPY;  Service: Gastroenterology;  Laterality: N/A;   ESOPHAGEAL DILATION     ESOPHAGOGASTRODUODENOSCOPY (EGD) WITH PROPOFOL N/A 04/01/2018   Procedure: ESOPHAGOGASTRODUODENOSCOPY (EGD) WITH PROPOFOL;  Surgeon: Scot Jun, MD;  Location: [facility] ENDOSCOPY;  Service: Endoscopy;  Laterality: N/A;   ESOPHAGOGASTRODUODENOSCOPY (EGD) WITH PROPOFOL N/A 05/01/2023   Procedure: ESOPHAGOGASTRODUODENOSCOPY (EGD) WITH PROPOFOL;  Surgeon: Toney Reil, MD;  Location: [facility] ENDOSCOPY;  Service: Gastroenterology;  Laterality: N/A;   GIVENS CAPSULE STUDY  05/01/2023   Procedure: GIVENS CAPSULE STUDY;  Surgeon: Toney Reil, MD;  Location: [facility] ENDOSCOPY;  Service: Gastroenterology;;   LEFT HEART CATH AND CORS/GRAFTS ANGIOGRAPHY N/A 06/12/2019   Procedure: LEFT HEART CATH AND CORS/GRAFTS ANGIOGRAPHY;  Surgeon: Dalia Heading, MD;  Location: [facility] INVASIVE CV LAB;  Service: Cardiovascular;  Laterality: N/A;   LEFT HEART CATH AND CORS/GRAFTS ANGIOGRAPHY N/A 03/11/2020   Procedure: LEFT HEART CATH AND CORS/GRAFTS ANGIOGRAPHY;  Surgeon: Marcina Millard, MD;  Location: [facility] INVASIVE CV LAB;  Service: Cardiovascular;  Laterality: N/A;   LEFT HEART CATH AND CORS/GRAFTS ANGIOGRAPHY N/A 05/01/2021   Procedure: LEFT HEART CATH AND CORS/GRAFTS ANGIOGRAPHY;  Surgeon: Lamar Blinks, MD;  Location: [facility] INVASIVE CV LAB;  Service: Cardiovascular;  Laterality: N/A;   LEFT HEART CATH AND CORS/GRAFTS ANGIOGRAPHY N/A 11/02/2022   Procedure: LEFT HEART CATH AND CORS/GRAFTS ANGIOGRAPHY;  Surgeon: Alwyn Pea, MD;  Location: [facility] INVASIVE CV LAB;  Service: Cardiovascular;  Laterality: N/A;   TONSILLECTOMY     VASCULAR SURGERY      SOCIAL HISTORY:   Social  History   Tobacco Use   Smoking status: Former    Current packs/day: 0.00    Average packs/day: 3.0 packs/day for 50.0 years (150.0 ttl pk-yrs)    Types: Cigarettes    Start date: 04/23/1963    Quit date: 04/22/2013    Years since quitting: 10.4   Smokeless tobacco: Never   Tobacco comments:    Reports not smoking for approx 8 years.  Substance Use Topics   Alcohol use: No    Comment: remotely quit alcohol use. Hx of heavy alcohol use.    FAMILY HISTORY:   Family History  Problem Relation Age of Onset   Heart attack Mother    Depression Mother    Heart disease Mother    COPD Mother    Hypertension Mother    Heart attack Father    Diabetes Father    Depression Father    Heart disease Father    Cirrhosis Father    Parkinson's disease Brother     DRUG ALLERGIES:   Allergies  Allergen Reactions   Prednisone  Other (See Comments) and Hypertension    Pt states that this medication puts him in A-fib    Demerol  [Meperidine Hcl]    Demerol [Meperidine] Hives   Jardiance [Empagliflozin] Other (See Comments)    Perineal pain   Sulfa Antibiotics Hives   Albuterol Sulfate [Albuterol] Palpitations and Other (See Comments)    Pt currently uses this medication.     Morphine Sulfate Nausea And Vomiting, Rash and Other (See Comments)    Pt states that he is only allergic to the tablet form of this medication.      REVIEW OF SYSTEMS:   ROS As per history of present illness. All pertinent systems were reviewed above. Constitutional, HEENT, cardiovascular, respiratory, GI, GU, musculoskeletal, neuro, psychiatric, endocrine, integumentary and hematologic systems were reviewed and are otherwise negative/unremarkable except for positive findings mentioned above in the HPI.   MEDICATIONS AT HOME:   Prior to Admission medications   Medication Sig Start Date End Date Taking? Authorizing Provider  Accu-Chek Softclix Lancets lancets Use as instructed to check sugar daily for type 2  diabetes. 03/03/21   Malva Limes, MD  ALPRAZolam Prudy Feeler) 1 MG tablet Take 1 tablet (1 mg total) by mouth 3 (three) times daily as needed. 09/13/23   Malva Limes, MD  atorvastatin (LIPITOR) 80 MG tablet TAKE 1 TABLET BY MOUTH AT BEDTIME 07/15/23   Malva Limes, MD  Blood Glucose Calibration (ACCU-CHEK GUIDE CONTROL) LIQD Use with blood glucose monitor as directed 02/20/21   Malva Limes, MD  Blood Glucose Monitoring Suppl (ACCU-CHEK GUIDE) w/Device KIT Use to check blood sugars as directed 02/20/21   Malva Limes, MD  Budeson-Glycopyrrol-Formoterol (BREZTRI AEROSPHERE) 160-9-4.8 MCG/ACT AERO Inhale 2 puffs into the lungs 2 (two) times daily. 06/02/23   Malva Limes, MD  carvedilol (COREG) 3.125 MG tablet Take 3.125 mg by mouth 2 (two) times daily with a meal.    Lovenia Shuck, MD  cetirizine (ZYRTEC) 10 MG tablet TAKE 1 TABLET BY MOUTH AT BEDTIME 03/12/23   Malva Limes, MD  clonazePAM (KLONOPIN) 0.5 MG tablet Take 0.5 mg by mouth 2 (two) times daily.    [provider]  Cyanocobalamin (VITAMIN B-12) 1000 MCG SUBL Place 1 tablet under the tongue daily.    [provider]  dicyclomine (BENTYL) 10 MG capsule Take 1 capsule (10 mg total) by mouth 3 (three) times daily as needed (abdominal pain). 05/14/23   Phineas Semen, MD  ELIQUIS 5 MG TABS tablet TAKE 1 TABLET BY MOUTH TWICE A DAY 08/16/23   Malva Limes, MD  FARXIGA 10 MG TABS tablet Take 10 mg by mouth daily. 05/25/23   [provider]  glucose blood (ACCU-CHEK GUIDE) test strip Use as instructed to check sugar daily for type 2 diabetes. 03/03/21   Malva Limes, MD  iron polysaccharides (FERREX 150) 150 MG capsule TAKE 1 CAPSULE BY MOUTH DAILY 09/15/23   Malva Limes, MD  isosorbide mononitrate (IMDUR) 120 MG 24 hr tablet TAKE 1 TABLET BY MOUTH DAILY 08/16/23   Malva Limes, MD  lidocaine (LIDODERM) 5 % Place 1 patch onto the skin daily. Remove & Discard patch within 12 hours or as  directed by MD 08/09/23   Arnetha Courser, MD  metFORMIN (GLUCOPHAGE-XR) 500 MG 24 hr tablet Take 1 tablet (500 mg total) by mouth 2 (two) times daily. Patient taking differently: Take 500 mg by mouth daily. 07/21/23   Jacky Kindle, FNP  methocarbamol (  ROBAXIN) 500 MG tablet TAKE 1 TABLET BY MOUTH EVERY 8 HOURS AS NEEDED FOR MUSCLE SPASMS 07/15/23   Malva Limes, MD  nitroGLYCERIN (NITROSTAT) 0.4 MG SL tablet Place 1 tablet (0.4 mg total) under the tongue every 5 (five) minutes as needed for chest pain. 09/03/23   Malva Limes, MD  omega-3 acid ethyl esters (LOVAZA) 1 g capsule TAKE 4 CAPSULES (4 GRAMS TOTAL) BY MOUTHDAILY 07/09/23   Malva Limes, MD  oxyCODONE-acetaminophen (PERCOCET) 10-325 MG tablet TAKE 1 TABLET BY MOUTH EVERY 4 HOURS AS NEEDED FOR PAIN 09/13/23   Malva Limes, MD  OXYGEN Inhale 2 L into the lungs at bedtime as needed (for shortness of breath).    [provider]  pantoprazole (PROTONIX) 40 MG tablet Take 1 tablet (40 mg total) by mouth 2 (two) times daily for 14 days. 06/08/23 08/07/23  Celso Amy, PA-C  ranolazine (RANEXA) 1000 MG SR tablet Take 1 tablet (1,000 mg total) by mouth 2 (two) times daily. 08/05/23   Marcelino Duster, MD  spironolactone (ALDACTONE) 25 MG tablet Take 1 tablet by mouth daily. 08/19/23 11/17/23  [provider]  traZODone (DESYREL) 100 MG tablet Take 1 tablet (100 mg total) by mouth at bedtime. 09/30/23   Enedina Finner, MD  venlafaxine XR (EFFEXOR-XR) 75 MG 24 hr capsule Take 75 mg by mouth daily with breakfast.    [provider]      VITAL SIGNS:  Blood pressure (!) 159/94, pulse 80, temperature 98.1 F (36.7 C), temperature source Oral, resp. rate 12, height 5\' 7"  (1.702 m), weight 83.5 kg, SpO2 98%.  PHYSICAL EXAMINATION:  Physical Exam  GENERAL:  67 y.o.-year-old patient lying in the bed with no acute distress.  EYES: Pupils equal, round, reactive to light and accommodation. No scleral icterus.  Extraocular muscles intact.  HEENT: Head atraumatic, normocephalic. Oropharynx and nasopharynx clear.  NECK:  Supple, no jugular venous distention. No thyroid enlargement, no tenderness.  LUNGS: Normal breath sounds bilaterally, no wheezing, rales,rhonchi or crepitation. No use of accessory muscles of respiration.  CARDIOVASCULAR: Regular rate and rhythm, S1, S2 normal. No murmurs, rubs, or gallops.  ABDOMEN: Soft, nondistended, nontender. Bowel sounds present. No organomegaly or mass.  EXTREMITIES: No pedal edema, cyanosis, or clubbing.  NEUROLOGIC: Cranial nerves II through XII are intact. Muscle strength 5/5 in all extremities. Sensation intact. Gait not checked.  PSYCHIATRIC: The patient is alert and oriented x 3.  Normal affect and good eye contact. SKIN: No obvious rash, lesion, or ulcer.   LABORATORY PANEL:   CBC Recent Labs  Lab 10/01/23 1722  WBC 6.0  HGB 16.5  HCT 51.4  PLT 175   ------------------------------------------------------------------------------------------------------------------  Chemistries  Recent Labs  Lab 09/28/23 2204 09/29/23 0125 09/30/23 0406 10/01/23 1722  NA 140   < > 136 138  K 3.5   < > 4.2 4.3  CL 98   < > 104 105  CO2 30   < > 28 28  GLUCOSE 172*   < > 111* 103*  BUN 17   < > 21 17  CREATININE 1.95*   < > 1.19 0.77  CALCIUM 9.8   < > 8.8* 9.1  MG  --   --  1.8  --   AST 16  --   --   --   ALT 15  --   --   --   ALKPHOS 63  --   --   --   BILITOT 1.3*  --   --   --    < > =  values in this interval not displayed.   ------------------------------------------------------------------------------------------------------------------  Cardiac Enzymes No results for input(s): "TROPONINI" in the last 168 hours. ------------------------------------------------------------------------------------------------------------------  RADIOLOGY:  DG Chest 2 View  Result Date: 10/01/2023 CLINICAL DATA:  Shortness of breath. EXAM: CHEST - 2 VIEW  COMPARISON:  Chest radiograph dated 09/28/2023. FINDINGS: Minimal bibasilar atelectasis. No focal consolidation, pleural effusion, pneumothorax. Mild cardiomegaly. Median sternotomy wires and CABG vascular clips. No acute osseous pathology. IMPRESSION: 1. No active cardiopulmonary disease. 2. Mild cardiomegaly. Electronically Signed   By: Elgie Collard M.D.   On: 10/01/2023 20:00      IMPRESSION AND PLAN:  Assessment and Plan: Chest pain - The patient will be admitted to an observation cardiac telemetry bed. - Will follow serial troponins and EKGs. - The patient will be placed on aspirin as well as p.r.n. sublingual nitroglycerin and morphine sulfate for pain. - We will obtain a cardiology consult in a.m. for further cardiac risk stratification. - I notified Dr. Welton Flakes about the patient     Essential hypertension - We will hold off antihypertensives pending improvement of his BP.   Type 2 diabetes mellitus without complications (HCC) - The patient will be placed on supplemental coverage with NovoLog. - We will hold off metformin given AKI. - We will continue [place].   Coronary artery disease - We will continue beta-blocker therapy as well as Imdur, Ranexa and as needed sublingual nitroglycerin.   Chronic atrial fibrillation with RVR (HCC) - Will obtain continue Eliquis and beta-blocker therapy.   Anxiety - We will continue Xanax.   Dyslipidemia - We will continue statin therapy.   Gout - We will continue allopurinol.     DVT prophylaxis: Eliquis Advanced Care Planning:  Code Status: The patient is DNR and DNI. Family Communication:  The plan of care was discussed in details with the patient (and family). I answered all questions. The patient agreed to proceed with the above mentioned plan. Further management will depend upon hospital course. Disposition Plan: Back to previous home environment Consults called: Cardiology. All the records are reviewed and case discussed  with ED provider.  Status is: Observation   I certify that at the time of admission, it is my clinical judgment that the patient will require  hospital care extending less than 2 midnights.                            Dispo: The patient is from: Home              Anticipated d/c is to: Home              Patient currently is not medically stable to d/c.              Difficult to place patient: No  Hannah Beat M.D on 10/01/2023 at 10:17 PM  [facility]   From 7 PM-7 AM, contact night-coverage [email or web address]  CC: Primary care physician; Malva Limes, MD

## 2023-10-01 NOTE — Patient Outreach (Unsigned)
Care Management   Visit Note  10/01/2023 Name: Lawrence [name] MRN: [id number] DOB: [birth year]  Subjective: Lawrence [name] is a 67 y.o. year old male who is a primary care patient of [name], Lawrence Isaacs, MD. The Care Management team was consulted for assistance.      Engaged with patient and his grandson/caregiver Lawrence [name] via telephone.  Lawrence [name] was noted to be very weak and dizzy during this outreach. Reports experiencing chest pain all day today. BP was noted to be 160/112 during the call.   911 was called. RNCM remained on line with Lawrence [name] until paramedics arrived at the residence. He was transported to [facility] Emergency Department.   Assessment:  Outpatient Encounter Medications as of 10/01/2023  Medication Sig Note   ALPRAZolam (XANAX) 1 MG tablet Take 1 tablet (1 mg total) by mouth 3 (three) times daily as needed.    atorvastatin (LIPITOR) 80 MG tablet TAKE 1 TABLET BY MOUTH AT BEDTIME    carvedilol (COREG) 3.125 MG tablet Take 3.125 mg by mouth 2 (two) times daily with a meal.    cetirizine (ZYRTEC) 10 MG tablet TAKE 1 TABLET BY MOUTH AT BEDTIME    Cyanocobalamin (VITAMIN B-12) 1000 MCG SUBL Place 1 tablet under the tongue daily.    ELIQUIS 5 MG TABS tablet TAKE 1 TABLET BY MOUTH TWICE A DAY    FARXIGA 10 MG TABS tablet Take 10 mg by mouth daily.    isosorbide mononitrate (IMDUR) 120 MG 24 hr tablet TAKE 1 TABLET BY MOUTH DAILY 10/02/2023: Hold if SBP <100   metFORMIN (GLUCOPHAGE-XR) 500 MG 24 hr tablet Take 1 tablet (500 mg total) by mouth 2 (two) times daily. (Patient taking differently: Take 500 mg by mouth daily.) 10/01/2023: Reports taking one a day   methocarbamol (ROBAXIN) 500 MG tablet TAKE 1 TABLET BY MOUTH EVERY 8 HOURS AS NEEDED FOR MUSCLE SPASMS 08/26/2023: Up to 4x a day   omega-3 acid ethyl esters (LOVAZA) 1 g capsule TAKE 4 CAPSULES (4 GRAMS TOTAL) BY MOUTHDAILY    OXYGEN Inhale 2 L into the lungs at bedtime as needed (for shortness of breath).    ranolazine  (RANEXA) 1000 MG SR tablet Take 1 tablet (1,000 mg total) by mouth 2 (two) times daily.    spironolactone (ALDACTONE) 25 MG tablet Take 1 tablet by mouth daily.    traZODone (DESYREL) 100 MG tablet Take 1 tablet (100 mg total) by mouth at bedtime.    venlafaxine XR (EFFEXOR-XR) 75 MG 24 hr capsule Take 75 mg by mouth daily with breakfast.    [DISCONTINUED] clonazePAM (KLONOPIN) 0.5 MG tablet Take 0.5 mg by mouth 2 (two) times daily. (Patient not taking: Reported on 10/02/2023)    Accu-Chek Softclix Lancets lancets Use as instructed to check sugar daily for type 2 diabetes.    Blood Glucose Calibration (ACCU-CHEK GUIDE CONTROL) LIQD Use with blood glucose monitor as directed    Blood Glucose Monitoring Suppl (ACCU-CHEK GUIDE) w/Device KIT Use to check blood sugars as directed    Budeson-Glycopyrrol-Formoterol (BREZTRI AEROSPHERE) 160-9-4.8 MCG/ACT AERO Inhale 2 puffs into the lungs 2 (two) times daily.    dicyclomine (BENTYL) 10 MG capsule Take 1 capsule (10 mg total) by mouth 3 (three) times daily as needed (abdominal pain).    glucose blood (ACCU-CHEK GUIDE) test strip Use as instructed to check sugar daily for type 2 diabetes.    iron polysaccharides (FERREX 150) 150 MG capsule TAKE 1 CAPSULE BY MOUTH DAILY    lidocaine (  LIDODERM) 5 % Place 1 patch onto the skin daily. Remove & Discard patch within 12 hours or as directed by MD    nitroGLYCERIN (NITROSTAT) 0.4 MG SL tablet Place 1 tablet (0.4 mg total) under the tongue every 5 (five) minutes as needed for chest pain.    oxyCODONE-acetaminophen (PERCOCET) 10-325 MG tablet TAKE 1 TABLET BY MOUTH EVERY 4 HOURS AS NEEDED FOR PAIN    pantoprazole (PROTONIX) 40 MG tablet Take 1 tablet (40 mg total) by mouth 2 (two) times daily for 14 days.    No facility-administered encounter medications on file as of 10/01/2023.    Interventions:   Goals Addressed             This Visit's Progress    Care Management       Current Barriers:  Care Management  support and education needs related to HTN, CHF, CAD, COPD, and DM High Fall Risk Requires additional Care Giver support in the home  Planned Interventions: CAD, HTN, CHF Lawrence [name] was recently evaluated and admitted to [facility] for chest pain on 09/28/23. He was discharged on 09/30/23. Reviewed hospital discharge instructions and medications with Lawrence [name] and his grandson Lawrence [name]. He did not discontinue metoprolol, lisinopril and amlodipine as ordered. Lawrence [name] agreed to remove these medications. Aware that Lawrence [name] should also be discontinued. Lawrence [name] reports that medication had already been removed from the pill bag. Lawrence [name] reported being very weak and dizzy during the outreach. Lawrence [name] confirmed that he has been extremely weak since returning home yesterday. He initially felt it was due to lack of rest but notes the symptoms have not resolved. He has not been able to get up today without assistance. Lawrence [name] also notes constant throbbing chest pain all day today. He has experienced a few episodes of nausea. He denies shortness of breath. Denies headaches or visual changes. Reports checking BP a few times today. One reading was normal. Notes a second reading with a diastolic of 146/102. Agreed to check BP during the call. Reports reading was 160/112. Discussed symptoms and concerns that he needs to be evaluated in the ER. He has a history of chronic intermittent chest pain, however he reports the symptoms have lasted all day. He is agreeable to this plan. 911 was called with patient on the line. RNCM remained on line until paramedics arrived. Per paramedic team, Lawrence [name] appeared very weak, his BP was still elevated. Agreed that he needed further evaluation in the ER. Lawrence [name] was transported to [facility] for further evaluation.            PLAN Lawrence [name] was transported to [facility] Emergency Department for further evaluation RNCM follow up pending discharge/disposition   Katina Degree  Health  [facility], [facility] Health RN Care Manager Direct Dial: [phone] Website: [email or web address]

## 2023-10-01 NOTE — Assessment & Plan Note (Signed)
-   The patient will be admitted to an observation cardiac telemetry bed. - Will follow serial troponins and EKGs. - The patient will be placed on aspirin as well as p.r.n. sublingual nitroglycerin and morphine sulfate for pain. - We will obtain a cardiology consult in a.m. for further cardiac risk stratification. - I notified Dr.Khan about the patient

## 2023-10-01 NOTE — Discharge Summary (Signed)
Physician Discharge Summary   Patient: Lawrence [name] MRN: [id number] DOB: 02-Feb-1956  Admit date:     09/28/2023  Discharge date: 09/30/2023  Discharge Physician: Enedina Finner   PCP: Malva Limes, MD   Recommendations at discharge:    F/u PCP in 1-2 weeks HOLD bp meds if SBP <100  Discharge Diagnoses: Principal Problem:   Near syncope Active Problems:   AKI (acute kidney injury) [facility])   Essential hypertension   Type 2 diabetes mellitus without complications (HCC)   Gout   Dyslipidemia   Anxiety   Chronic atrial fibrillation with RVR (HCC)   Coronary artery disease  Lawrence [name] is a 67 y.o. Caucasian male with medical history significant for paroxysmal atrial fibrillation, bradycardia, anxiety, osteoarthritis, asthma, coronary artery disease, chronic chest pain, celiac disease, COPD, chronic hypoxemic respiratory failure, chronic low back pain, GERD, hypertension,, coronary artery disease s/p coronary stent, chronic diastolic and systolic CHF (echo in September 2024 showed EF estimated at 30 to 35%, grade 2 diastolic dysfunction).  He presented to the hospital with chest pain, general weakness and lightheadedness.  He fell about a day prior to admission.  His blood pressure was low at home.   Near syncope/lightheadedness suspected due to acute kidney injury secondary to dehydration -- patient came in with creatinine of 1.95 received IV fluids creatinine came to baseline 1.1 -- overall blood pressure improved he felt a lot better -- resume home meds and gave holding parameter for BP meds -- did well with PT. No PT needs at home -- patient to follow-up PCP as outpatient  History of hypertension relative hypotension patient carries a low blood pressure -- resumed low-dose Coreg and spironolactone given cardiomyopathy  Type II diabetes -- continue home meds  Coronary artery disease -- stable continue cardiac meds  Overall patient improved. Discussed discharge plan he  is in agreement.     Pain control - [employer] Controlled Substance Reporting System database was reviewed. and patient was instructed, not to drive, operate heavy machinery, perform activities at heights, swimming or participation in water activities or provide baby-sitting services while on Pain, Sleep and Anxiety Medications; until their outpatient Physician has advised to do so again. Also recommended to not to take more than prescribed Pain, Sleep and Anxiety Medications.  Disposition: Home Diet recommendation:  Discharge Diet Orders (From admission, onward)     Start     Ordered   09/30/23 0000  Diet - low sodium heart healthy        09/30/23 1310           Cardiac and Carb modified diet DISCHARGE MEDICATION: Allergies as of 09/30/2023       Reactions   Prednisone Other (See Comments), Hypertension   Pt states that this medication puts him in A-fib    Demerol  [meperidine Hcl]    Demerol [meperidine] Hives   Jardiance [empagliflozin] Other (See Comments)   Perineal pain   Sulfa Antibiotics Hives   Albuterol Sulfate [albuterol] Palpitations, Other (See Comments)   Pt currently uses this medication.     Morphine Sulfate Nausea And Vomiting, Rash, Other (See Comments)   Pt states that he is only allergic to the tablet form of this medication.          Medication List     STOP taking these medications    amLODipine 5 MG tablet Commonly known as: NORVASC   lisinopril 20 MG tablet Commonly known as: ZESTRIL   metoprolol succinate 25  MG 24 hr tablet Commonly known as: TOPROL-XL   sacubitril-valsartan 24-26 MG Commonly known as: ENTRESTO       TAKE these medications    Accu-Chek Guide Control Liqd Use with blood glucose monitor as directed   Accu-Chek Guide test strip Generic drug: glucose blood Use as instructed to check sugar daily for type 2 diabetes.   Accu-Chek Guide w/Device Kit Use to check blood sugars as directed   Accu-Chek Softclix  Lancets lancets Use as instructed to check sugar daily for type 2 diabetes.   ALPRAZolam 1 MG tablet Commonly known as: XANAX Take 1 tablet (1 mg total) by mouth 3 (three) times daily as needed.   atorvastatin 80 MG tablet Commonly known as: LIPITOR TAKE 1 TABLET BY MOUTH AT BEDTIME   Breztri Aerosphere 160-9-4.8 MCG/ACT Aero Generic drug: Budeson-Glycopyrrol-Formoterol Inhale 2 puffs into the lungs 2 (two) times daily.   carvedilol 3.125 MG tablet Commonly known as: COREG Take 3.125 mg by mouth 2 (two) times daily with a meal.   cetirizine 10 MG tablet Commonly known as: ZYRTEC TAKE 1 TABLET BY MOUTH AT BEDTIME   clonazePAM 0.5 MG tablet Commonly known as: KLONOPIN Take 0.5 mg by mouth 2 (two) times daily.   dicyclomine 10 MG capsule Commonly known as: BENTYL Take 1 capsule (10 mg total) by mouth 3 (three) times daily as needed (abdominal pain).   Eliquis 5 MG Tabs tablet Generic drug: apixaban TAKE 1 TABLET BY MOUTH TWICE A DAY   Farxiga 10 MG Tabs tablet Generic drug: dapagliflozin propanediol Take 10 mg by mouth daily.   Ferrex 150 150 MG capsule Generic drug: iron polysaccharides TAKE 1 CAPSULE BY MOUTH DAILY   isosorbide mononitrate 120 MG 24 hr tablet Commonly known as: IMDUR TAKE 1 TABLET BY MOUTH DAILY Notes to patient: Hold if SBP<100   lidocaine 5 % Commonly known as: LIDODERM Place 1 patch onto the skin daily. Remove & Discard patch within 12 hours or as directed by MD   metFORMIN 500 MG 24 hr tablet Commonly known as: GLUCOPHAGE-XR Take 1 tablet (500 mg total) by mouth 2 (two) times daily. What changed: when to take this   methocarbamol 500 MG tablet Commonly known as: ROBAXIN TAKE 1 TABLET BY MOUTH EVERY 8 HOURS AS NEEDED FOR MUSCLE SPASMS   nitroGLYCERIN 0.4 MG SL tablet Commonly known as: NITROSTAT Place 1 tablet (0.4 mg total) under the tongue every 5 (five) minutes as needed for chest pain.   omega-3 acid ethyl esters 1 g  capsule Commonly known as: LOVAZA TAKE 4 CAPSULES (4 GRAMS TOTAL) BY MOUTHDAILY   oxyCODONE-acetaminophen 10-325 MG tablet Commonly known as: PERCOCET TAKE 1 TABLET BY MOUTH EVERY 4 HOURS AS NEEDED FOR PAIN   OXYGEN Inhale 2 L into the lungs at bedtime as needed (for shortness of breath).   pantoprazole 40 MG tablet Commonly known as: PROTONIX Take 1 tablet (40 mg total) by mouth 2 (two) times daily for 14 days.   ranolazine 1000 MG SR tablet Commonly known as: RANEXA Take 1 tablet (1,000 mg total) by mouth 2 (two) times daily.   spironolactone 25 MG tablet Commonly known as: ALDACTONE Take 1 tablet by mouth daily.   traZODone 100 MG tablet Commonly known as: DESYREL Take 1 tablet (100 mg total) by mouth at bedtime. What changed:  medication strength how much to take   venlafaxine XR 75 MG 24 hr capsule Commonly known as: EFFEXOR-XR Take 75 mg by mouth daily with breakfast.  Vitamin B-12 1000 MCG Subl Place 1 tablet under the tongue daily.        Follow-up Information     Malva Limes, MD. Go on 10/04/2023.   Specialty: Family Medicine Why: You already have a appointment 10/04/2023 at  2:00pm. Contact information: [address] [place] [address] [phone]                Filed Weights   09/28/23 2142 09/29/23 0000 09/30/23 0500  Weight: 86.6 kg 83.6 kg 83.4 kg      Condition at discharge: fair  The results of significant diagnostics from this hospitalization (including imaging, microbiology, ancillary and laboratory) are listed below for reference.   Imaging Studies: DG Chest Portable 1 View  Result Date: 09/29/2023 CLINICAL DATA:  Status post fall. EXAM: PORTABLE CHEST 1 VIEW COMPARISON:  September 10, 2023 FINDINGS: Multiple sternal wires and vascular clips are seen. The heart size and mediastinal contours are within normal limits. A coronary artery stent is in place. Both lungs are clear. Multilevel degenerative  changes seen throughout the thoracic spine. IMPRESSION: 1. Evidence of prior median sternotomy/CABG. 2. No acute cardiopulmonary disease. Electronically Signed   By: Aram Candela M.D.   On: 09/29/2023 00:19   DG Pelvis Portable  Result Date: 09/29/2023 CLINICAL DATA:  Status post fall. EXAM: PORTABLE PELVIS 1-2 VIEWS COMPARISON:  June 15, 2023 FINDINGS: There is no evidence of pelvic fracture or diastasis. No pelvic bone lesions are seen. Moderate to marked severity vascular calcification is noted. IMPRESSION: No acute osseous abnormality. Electronically Signed   By: Aram Candela M.D.   On: 09/29/2023 00:17   CT Hip Left Wo Contrast  Result Date: 09/24/2023 CLINICAL DATA:  Left hip pain after a fall. EXAM: CT OF THE LEFT HIP WITHOUT CONTRAST TECHNIQUE: Multidetector CT imaging of the left hip was performed according to the standard protocol. Multiplanar CT image reconstructions were also generated. RADIATION DOSE REDUCTION: This exam was performed according to the departmental dose-optimization program which includes automated exposure control, adjustment of the mA and/or kV according to patient size and/or use of iterative reconstruction technique. COMPARISON:  X-ray from earlier same day FINDINGS: Bones/Joint/Cartilage No left femoral neck fracture. Left acetabulum is intact. The symphysis pubis and left SI joint are unremarkable. No fracture within the visualized left iliac bone. No evidence for acute fracture within the visualized portion of the left sacrum. Ligaments Suboptimally assessed by CT. Muscles and Tendons No evidence for muscle abnormality in the visualized anatomy around the left hip. Soft tissues No evidence for intramuscular or deep soft tissue hematoma in or about the left hip. No free fluid identified within the visualized peritoneal cavity. No substantial subcutaneous contusion. IMPRESSION: No acute bony or soft tissue findings to explain the patient's history of pain.  Electronically Signed   By: Kennith Center M.D.   On: 09/24/2023 05:07   CT Cervical Spine Wo Contrast  Result Date: 09/24/2023 CLINICAL DATA:  67 year old male status post syncope, fall. EXAM: CT CERVICAL SPINE WITHOUT CONTRAST TECHNIQUE: Multidetector CT imaging of the cervical spine was performed without intravenous contrast. Multiplanar CT image reconstructions were also generated. RADIATION DOSE REDUCTION: This exam was performed according to the departmental dose-optimization program which includes automated exposure control, adjustment of the mA and/or kV according to patient size and/or use of iterative reconstruction technique. COMPARISON:  Head CT reported separately today. Previous cervical spine CT 06/15/2023. FINDINGS: Alignment: Straightening but slightly improved cervical lordosis compared to July. Cervicothoracic junction  alignment is within normal limits. Bilateral posterior element alignment is within normal limits. Skull base and vertebrae: Visualized skull base is intact. No atlanto-occipital dissociation. C1 and C2 appear intact and aligned. Stable visualized osseous structures. A circumscribed lucent area in the left C3 body is stable since 12/26/2022 and most likely benign. Some underlying generalized osteopenia. Soft tissues and spinal canal: No prevertebral fluid or swelling. No visible canal hematoma. Chronic calcified cervical carotid atherosclerosis, partially retropharyngeal course of the right carotid. Disc levels: Mild for age cervical spine degeneration appears stable. Upper chest: Partially visible sternotomy. Visible upper thoracic levels appear intact. Upper lobe emphysema. Calcified aortic atherosclerosis. Other: Absent dentition. Visualized paranasal sinuses and mastoids are stable and well aerated. IMPRESSION: 1. No acute traumatic injury identified in the cervical spine. 2. Stable osteopenia and mild for age cervical spine degeneration. 3.  Emphysema (ICD10-J43.9).  Electronically Signed   By: Odessa Fleming M.D.   On: 09/24/2023 04:38   CT HEAD WO CONTRAST ( )  Result Date: 09/24/2023 CLINICAL DATA:  Head trauma EXAM: CT HEAD WITHOUT CONTRAST TECHNIQUE: Contiguous axial images were obtained from the base of the skull through the vertex without intravenous contrast. RADIATION DOSE REDUCTION: This exam was performed according to the departmental dose-optimization program which includes automated exposure control, adjustment of the mA and/or kV according to patient size and/or use of iterative reconstruction technique. COMPARISON:  None Available. FINDINGS: Brain: There is no mass, hemorrhage or extra-axial collection. There is hypoattenuation of the supratentorial white matter. CSF spaces are normal. Vascular: No hyperdense vessel or unexpected calcification. Skull: Normal. Negative for fracture or focal lesion. Sinuses/Orbits: No acute finding. Other: None. IMPRESSION: 1. No acute intracranial abnormality. 2. Findings of chronic small vessel ischemia. Electronically Signed   By: Deatra Robinson M.D.   On: 09/24/2023 01:16   DG Hip Unilat W or Wo Pelvis 2-3 Views Left  Result Date: 09/24/2023 CLINICAL DATA:  Left hip pain after fall. EXAM: DG HIP (WITH OR WITHOUT PELVIS) 2-3V LEFT COMPARISON:  None Available. FINDINGS: No acute fracture of the pelvis or left hip. Femoral head is well seated, no hip dislocation. Pubic rami are intact. No pubic symphyseal or sacroiliac diastasis. Mild bilateral hip degenerative change. There are prominent vascular calcifications. IMPRESSION: 1. No acute fracture or subluxation of the pelvis or left hip. 2. Mild bilateral hip degenerative change. Electronically Signed   By: Narda Rutherford M.D.   On: 09/24/2023 01:13   DG Chest 2 View  Result Date: 09/10/2023 CLINICAL DATA:  Chest pain EXAM: CHEST - 2 VIEW COMPARISON:  09/10/2023 coronary artery bypass FINDINGS: Lungs are well expanded, symmetric, and clear. No pneumothorax or pleural  effusion. Cardiac size within normal limits. Grafting has been performed. Pulmonary vascularity is normal. Osseous structures are age-appropriate. No acute bone abnormality. IMPRESSION: No active cardiopulmonary disease. Electronically Signed   By: Helyn Numbers M.D.   On: 09/10/2023 23:48   DG Chest Portable 1 View  Result Date: 09/10/2023 CLINICAL DATA:  Palpitations and shortness of breath EXAM: PORTABLE CHEST 1 VIEW COMPARISON:  Chest x-ray 08/11/2023 FINDINGS: Sternotomy wires are present. The heart size and mediastinal contours are within normal limits. Both lungs are clear. The visualized skeletal structures are unremarkable. IMPRESSION: No active disease. Electronically Signed   By: Darliss Cheney M.D.   On: 09/10/2023 01:14    Microbiology: Results for orders placed or performed during the hospital encounter of 09/10/23  Resp panel by RT-PCR (RSV, Flu A&B, Covid) Anterior Nasal Swab  Status: None   Collection Time: 09/10/23  1:06 AM   Specimen: Anterior Nasal Swab  Result Value Ref Range Status   SARS Coronavirus 2 by RT PCR NEGATIVE NEGATIVE Final    Comment: (NOTE) SARS-CoV-2 target nucleic acids are NOT DETECTED.  The SARS-CoV-2 RNA is generally detectable in upper respiratory specimens during the acute phase of infection. The lowest concentration of SARS-CoV-2 viral copies this assay can detect is 138 copies/mL. A negative result does not preclude SARS-Cov-2 infection and should not be used as the sole basis for treatment or other patient management decisions. A negative result may occur with  improper specimen collection/handling, submission of specimen other than nasopharyngeal swab, presence of viral mutation(s) within the areas targeted by this assay, and inadequate number of viral copies(<138 copies/mL). A negative result must be combined with clinical observations, patient history, and epidemiological information. The expected result is Negative.  Fact Sheet for  Patients:  BloggerCourse.com  Fact Sheet for Healthcare Providers:  SeriousBroker.it  This test is no t yet approved or cleared by the [place] FDA and  has been authorized for detection and/or diagnosis of SARS-CoV-2 by FDA under an Emergency Use Authorization (EUA). This EUA will remain  in effect (meaning this test can be used) for the duration of the COVID-19 declaration under Section 564(b)(1) of the Act, 21 U.S.C.section 360bbb-3(b)(1), unless the authorization is terminated  or revoked sooner.       Influenza A by PCR NEGATIVE NEGATIVE Final   Influenza B by PCR NEGATIVE NEGATIVE Final    Comment: (NOTE) The Xpert Xpress SARS-CoV-2/FLU/RSV plus assay is intended as an aid in the diagnosis of influenza from Nasopharyngeal swab specimens and should not be used as a sole basis for treatment. Nasal washings and aspirates are unacceptable for Xpert Xpress SARS-CoV-2/FLU/RSV testing.  Fact Sheet for Patients: BloggerCourse.com  Fact Sheet for Healthcare Providers: SeriousBroker.it  This test is not yet approved or cleared by the [place] FDA and has been authorized for detection and/or diagnosis of SARS-CoV-2 by FDA under an Emergency Use Authorization (EUA). This EUA will remain in effect (meaning this test can be used) for the duration of the COVID-19 declaration under Section 564(b)(1) of the Act, 21 U.S.C. section 360bbb-3(b)(1), unless the authorization is terminated or revoked.     Resp Syncytial Virus by PCR NEGATIVE NEGATIVE Final    Comment: (NOTE) Fact Sheet for Patients: BloggerCourse.com  Fact Sheet for Healthcare Providers: SeriousBroker.it  This test is not yet approved or cleared by the [place] FDA and has been authorized for detection and/or diagnosis of SARS-CoV-2 by FDA under an Emergency  Use Authorization (EUA). This EUA will remain in effect (meaning this test can be used) for the duration of the COVID-19 declaration under Section 564(b)(1) of the Act, 21 U.S.C. section 360bbb-3(b)(1), unless the authorization is terminated or revoked.  Performed at [facility], [address] [place], [place] [address]    *Note: Due to a large number of results and/or encounters for the requested time period, some results have not been displayed. A complete set of results can be found in Results Review.    Labs: CBC: Recent Labs  Lab 09/28/23 2204 09/29/23 0125  WBC 8.7 8.4  NEUTROABS 5.9  --   HGB 17.4* 15.8  HCT 53.7* 49.2  MCV 91.5 92.1  PLT 218 187   Basic Metabolic Panel: Recent Labs  Lab 09/28/23 2204 09/29/23 0125 09/30/23 0406  NA 140 143 136  K 3.5  4.1 4.2  CL 98 103 104  CO2 30 31 28   GLUCOSE 172* 136* 111*  BUN 17 20 21   CREATININE 1.95* 1.77* 1.19  CALCIUM 9.8 9.2 8.8*  MG  --   --  1.8  PHOS  --   --  4.0   Liver Function Tests: Recent Labs  Lab 09/28/23 2204  AST 16  ALT 15  ALKPHOS 63  BILITOT 1.3*  PROT 7.1  ALBUMIN 4.2   CBG: Recent Labs  Lab 09/29/23 0422 09/29/23 1633 09/29/23 2028 09/30/23 0757 09/30/23 1109  GLUCAP 120* 117* 124* 125* 153*    Discharge time spent: greater than 30 minutes.  Signed: Enedina Finner, MD [facility] 10/01/2023

## 2023-10-02 ENCOUNTER — Observation Stay: Payer: Medicare HMO

## 2023-10-02 ENCOUNTER — Encounter: Payer: Self-pay | Admitting: Family Medicine

## 2023-10-02 ENCOUNTER — Other Ambulatory Visit: Payer: Self-pay

## 2023-10-02 DIAGNOSIS — I200 Unstable angina: Secondary | ICD-10-CM | POA: Diagnosis not present

## 2023-10-02 DIAGNOSIS — R079 Chest pain, unspecified: Secondary | ICD-10-CM

## 2023-10-02 DIAGNOSIS — I5042 Chronic combined systolic (congestive) and diastolic (congestive) heart failure: Secondary | ICD-10-CM

## 2023-10-02 DIAGNOSIS — E119 Type 2 diabetes mellitus without complications: Secondary | ICD-10-CM | POA: Diagnosis not present

## 2023-10-02 DIAGNOSIS — R0789 Other chest pain: Principal | ICD-10-CM

## 2023-10-02 DIAGNOSIS — I2510 Atherosclerotic heart disease of native coronary artery without angina pectoris: Secondary | ICD-10-CM

## 2023-10-02 DIAGNOSIS — K5730 Diverticulosis of large intestine without perforation or abscess without bleeding: Secondary | ICD-10-CM | POA: Diagnosis not present

## 2023-10-02 DIAGNOSIS — E663 Overweight: Secondary | ICD-10-CM | POA: Insufficient documentation

## 2023-10-02 DIAGNOSIS — J432 Centrilobular emphysema: Secondary | ICD-10-CM | POA: Diagnosis not present

## 2023-10-02 DIAGNOSIS — J9611 Chronic respiratory failure with hypoxia: Secondary | ICD-10-CM | POA: Diagnosis not present

## 2023-10-02 DIAGNOSIS — R911 Solitary pulmonary nodule: Secondary | ICD-10-CM | POA: Diagnosis not present

## 2023-10-02 LAB — GLUCOSE, CAPILLARY
Glucose-Capillary: 104 mg/dL — ABNORMAL HIGH (ref 70–99)
Glucose-Capillary: 158 mg/dL — ABNORMAL HIGH (ref 70–99)

## 2023-10-02 LAB — CBG MONITORING, ED
Glucose-Capillary: 106 mg/dL — ABNORMAL HIGH (ref 70–99)
Glucose-Capillary: 87 mg/dL (ref 70–99)
Glucose-Capillary: 93 mg/dL (ref 70–99)

## 2023-10-02 LAB — TROPONIN I (HIGH SENSITIVITY)
Troponin I (High Sensitivity): 28 ng/L — ABNORMAL HIGH (ref <18)
Troponin I (High Sensitivity): 24 ng/L — ABNORMAL HIGH (ref <18)

## 2023-10-02 LAB — MAGNESIUM: Magnesium: 1.8 mg/dL (ref 1.7–2.4)

## 2023-10-02 MED ORDER — UMECLIDINIUM BROMIDE 62.5 MCG/ACT IN AEPB
1.0000 | INHALATION_SPRAY | Freq: Every day | RESPIRATORY_TRACT | Status: DC
  Filled 2023-10-02 (×2): qty 7

## 2023-10-02 MED ORDER — MORPHINE SULFATE (PF) 2 MG/ML IV SOLN
2.0000 mg | INTRAVENOUS | Status: DC | PRN
  Administered 2023-10-02 – 2023-10-03 (×7): 2 mg via INTRAVENOUS
  Filled 2023-10-02 (×7): qty 1

## 2023-10-02 MED ORDER — INSULIN ASPART 100 UNIT/ML IJ SOLN
0.0000 [IU] | INTRAMUSCULAR | Status: DC
  Administered 2023-10-02: 2 [IU] via SUBCUTANEOUS
  Administered 2023-10-03: 1 [IU] via SUBCUTANEOUS
  Filled 2023-10-02 (×2): qty 1

## 2023-10-02 MED ORDER — FLUTICASONE FUROATE-VILANTEROL 200-25 MCG/ACT IN AEPB
1.0000 | INHALATION_SPRAY | Freq: Every day | RESPIRATORY_TRACT | Status: DC
  Filled 2023-10-02 (×2): qty 28

## 2023-10-02 MED ORDER — IOHEXOL 350 MG/ML SOLN
100.0000 mL | Freq: Once | INTRAVENOUS | Status: AC | PRN
Start: 2023-10-02 — End: 2023-10-02
  Administered 2023-10-02: 100 mL via INTRAVENOUS

## 2023-10-02 NOTE — Hospital Course (Signed)
MCCOY PITZ is a 67 y.o. Caucasian male with medical history significant for  atrial fibrillation, anxiety, osteoarthritis, asthma, coronary artery disease, celiac disease, COPD, GERD, hypertension,, coronary artery disease status post PCI and stents placement, who presented to the emergency room with acute onset of midsternal chest pain.  The pain was severe, mid sternum, radiates to the left shoulder and arm, persistent.  Pain never went away since admission to the hospital on 11/13.  EKG did not show any ST elevation, peak troponin 34, BNP 380. Most recent heart catheter was 11/02/2022 showed 85% stenosis in RPAV1, 90% RPDA1, 85% RPDA2, ejection fraction 45 to 50%. Patient has been treated with Imdur as well as Ranexa. Patient has been seen by cardiology, no options for stents for these smaller vessel occlusion.

## 2023-10-02 NOTE — Consult Note (Signed)
Lawrence [name] is a 67 y.o. male  [id number]  Primary Cardiologist: [facility] cardiology Reason for Consultation: Chest pain  HPI: This is a 67 year old white male patient with past medical history of atrial fibrillation congestive heart failure and coronary artery disease status post CABG and multiple PCI's with last cardiac catheterization done at [facility] in December 2023 and another 1 was done last month at [facility].  Patient presented today with chest pain described as sharp pain in retrosternal area associated with shortness of breath.  He has been given sublingual nitroglycerin and morphine with some relief of chest pain.   Review of Systems: No orthopnea PND or leg swelling   Past Medical History:  Diagnosis Date   A-fib (HCC)    Anemia    Anginal pain (HCC)    Anxiety    Arthritis    Asthma    CAD (coronary artery disease)    a. 2002 CABGx2 (LIMA->LAD, VG->VG->OM1);  b. 09/2012 DES->OM;  c. 03/2015 PTCA of LAD [facility]) in setting of atretic LIMA; d. 05/2015 Cath [facility]): nonobs dzs; e. 06/2015 Cath ([facility]): LM nl, LAD 45p/d ISR, 50d, D1/2 small, LCX 50p/d ISR, OM1 70ost, 30 ISR, VG->OM1 50ost, 16m, LIMA->LAD 99p/d - atretic, RCA dom, nl; f.cath 10/16: 40-50%(FFR 0.90) pLAD, 75% (FFR 0.77) mLAD s/p PCI/DES, oRCA 40% (FFR0.95)   Cancer (HCC)    SKIN CANCER ON BACK   Celiac disease    Chronic diastolic CHF (congestive heart failure) (HCC)    a. 06/2009 Echo: EF 60-65%, Gr 1 DD, triv AI, mildly dil LA, nl RV.   COPD (chronic obstructive pulmonary disease) (HCC)    a. Chronic bronchitis and emphysema.   DDD (degenerative disc disease), lumbar    Diverticulosis    Dysrhythmia    Essential hypertension    GERD (gastroesophageal reflux disease)    History of hiatal hernia    History of kidney stones    H/O   History of tobacco abuse    a. Quit 2014.   Myocardial infarction [facility]) 2002   4 STENTS   Pancreatitis    PSVT (paroxysmal supraventricular tachycardia) (HCC)    a.  10/2012 Noted on Zio Patch.   Sleep apnea    LOST CORD TO CPAP -ONLY 02 @ BEDTIME   Tubular adenoma of colon    Type II diabetes mellitus (HCC)     (Not in a hospital admission)     apixaban  5 mg Oral BID   atorvastatin  80 mg Oral QHS   carvedilol  3.125 mg Oral BID WC   clonazePAM  0.5 mg Oral BID   cyanocobalamin  1,000 mcg Oral Daily   dapagliflozin propanediol  10 mg Oral Daily   fluticasone furoate-vilanterol  1 puff Inhalation Daily   And   umeclidinium bromide  1 puff Inhalation Daily   insulin aspart  0-9 Units Subcutaneous Q4H   iron polysaccharides  150 mg Oral Daily   isosorbide mononitrate  120 mg Oral Daily   lidocaine  1 patch Transdermal Q24H   loratadine  10 mg Oral Daily   omega-3 acid ethyl esters  1 g Oral Daily   pantoprazole  40 mg Oral BID   ranolazine  1,000 mg Oral BID   spironolactone  25 mg Oral Daily   traZODone  100 mg Oral QHS   venlafaxine XR  75 mg Oral Q breakfast    Infusions:  sodium chloride 40 mL/hr at 10/02/23 0016  Allergies  Allergen Reactions   Prednisone Other (See Comments) and Hypertension    Pt states that this medication puts him in A-fib    Demerol  [Meperidine Hcl]    Demerol [Meperidine] Hives   Sulfa Antibiotics Hives   Albuterol Sulfate [Albuterol] Palpitations and Other (See Comments)    Pt currently uses this medication.     Empagliflozin Other (See Comments) and Hives    Perineal pain  Other Reaction(s): Other (See Comments)   Morphine Sulfate Nausea And Vomiting, Rash and Other (See Comments)    Pt states that he is only allergic to the tablet form of this medication.      Social History   Socioeconomic History   Marital status: Widowed    Spouse name: Not on file   Number of children: 1   Years of education: Not on file   Highest education level: 10th grade  Occupational History   Occupation: Disabled   Occupation: on Tree surgeon  Tobacco Use   Smoking status: Former    Current packs/day:  0.00    Average packs/day: 3.0 packs/day for 50.0 years (150.0 ttl pk-yrs)    Types: Cigarettes    Start date: 04/23/1963    Quit date: 04/22/2013    Years since quitting: 10.4   Smokeless tobacco: Never   Tobacco comments:    Reports not smoking for approx 8 years.  Vaping Use   Vaping status: Never Used  Substance and Sexual Activity   Alcohol use: No    Comment: remotely quit alcohol use. Hx of heavy alcohol use.   Drug use: Not Currently    Types: Marijuana   Sexual activity: Not Currently  Other Topics Concern   Not on file  Social History Narrative   Pt lives in [place] with wife.  Does not routinely exercise.   Social Determinants of Health   Financial Resource Strain: Low Risk  (08/18/2023)   Received from [facility]   Overall Financial Resource Strain (CARDIA)    Difficulty of Paying Living Expenses: Not very hard  Food Insecurity: Food Insecurity Present (10/01/2023)   Hunger Vital Sign    Worried About Running Out of Food in the Last Year: Sometimes true    Ran Out of Food in the Last Year: Sometimes true  Transportation Needs: No Transportation Needs (10/01/2023)   PRAPARE - Administrator, Civil Service (Medical): No    Lack of Transportation (Non-Medical): No  Physical Activity: Inactive (03/10/2023)   Exercise Vital Sign    Days of Exercise per Week: 0 days    Minutes of Exercise per Session: 0 min  Stress: No Stress Concern Present (03/10/2023)   [employer] of Occupational Health - Occupational Stress Questionnaire    Feeling of Stress : Not at all  Social Connections: Moderately Isolated (08/06/2023)   Social Connection and Isolation Panel [NHANES]    Frequency of Communication with Friends and Family: More than three times a week    Frequency of Social Gatherings with Friends and Family: More than three times a week    Attends Religious Services: More than 4 times per year    Active Member of [employer] or Organizations: No    Attends  Banker Meetings: Not on file    Marital Status: Widowed  Intimate Partner Violence: Not At Risk (10/01/2023)   Humiliation, Afraid, Rape, and Kick questionnaire    Fear of Current or Ex-Partner: No    Emotionally Abused: No  Physically Abused: No    Sexually Abused: No    Family History  Problem Relation Age of Onset   Heart attack Mother    Depression Mother    Heart disease Mother    COPD Mother    Hypertension Mother    Heart attack Father    Diabetes Father    Depression Father    Heart disease Father    Cirrhosis Father    Parkinson's disease Brother     PHYSICAL EXAM: Vitals:   10/02/23 0930 10/02/23 0952  BP: 115/79   Pulse: 73   Resp: 18   Temp:  97.6 F (36.4 C)  SpO2: 95%      Intake/Output Summary (Last 24 hours) at 10/02/2023 1056 Last data filed at 10/02/2023 0749 Gross per 24 hour  Intake --  Output 325 ml  Net -325 ml    General:  Well appearing. No respiratory difficulty HEENT: normal Neck: supple. no JVD. Carotids 2+ bilat; no bruits. No lymphadenopathy or thryomegaly appreciated. Cor: PMI nondisplaced. Regular rate & rhythm. No rubs, gallops or murmurs. Lungs: clear Abdomen: soft, nontender, nondistended. No hepatosplenomegaly. No bruits or masses. Good bowel sounds. Extremities: no cyanosis, clubbing, rash, edema Neuro: alert & oriented x 3, cranial nerves grossly intact. moves all 4 extremities w/o difficulty. Affect pleasant.  ECG: Atrial fibrillation rate about 60/min with old inferior wall and anteroseptal wall MI with nonspecific ST-T changes no acute changes  Results for orders placed or performed during the hospital encounter of 10/01/23 (from the past 24 hour(s))  Basic metabolic panel     Status: Abnormal   Collection Time: 10/01/23  5:22 PM  Result Value Ref Range   Sodium 138 135 - 145 mmol/L   Potassium 4.3 3.5 - 5.1 mmol/L   Chloride 105 98 - 111 mmol/L   CO2 28 22 - 32 mmol/L   Glucose, Bld 103 (H) 70 -  99 mg/dL   BUN 17 8 - 23 mg/dL   Creatinine, Ser 6.04 0.61 - 1.24 mg/dL   Calcium 9.1 8.9 - 54.0 mg/dL   GFR, Estimated >98 >11 mL/min   Anion gap 5 5 - 15  CBC with Differential     Status: None   Collection Time: 10/01/23  5:22 PM  Result Value Ref Range   WBC 6.0 4.0 - 10.5 K/uL   RBC 5.58 4.22 - 5.81 MIL/uL   Hemoglobin 16.5 13.0 - 17.0 g/dL   HCT 91.4 78.2 - 95.6 %   MCV 92.1 80.0 - 100.0 fL   MCH 29.6 26.0 - 34.0 pg   MCHC 32.1 30.0 - 36.0 g/dL   RDW 21.3 08.6 - 57.8 %   Platelets 175 150 - 400 K/uL   nRBC 0.0 0.0 - 0.2 %   Neutrophils Relative % 67 %   Neutro Abs 4.1 1.7 - 7.7 K/uL   Lymphocytes Relative 21 %   Lymphs Abs 1.3 0.7 - 4.0 K/uL   Monocytes Relative 7 %   Monocytes Absolute 0.4 0.1 - 1.0 K/uL   Eosinophils Relative 3 %   Eosinophils Absolute 0.2 0.0 - 0.5 K/uL   Basophils Relative 1 %   Basophils Absolute 0.0 0.0 - 0.1 K/uL   Immature Granulocytes 1 %   Abs Immature Granulocytes 0.05 0.00 - 0.07 K/uL  Troponin I (High Sensitivity)     Status: Abnormal   Collection Time: 10/01/23  5:22 PM  Result Value Ref Range   Troponin I (High Sensitivity) 28 (H) <18 ng/L  Brain natriuretic peptide     Status: Abnormal   Collection Time: 10/01/23  5:22 PM  Result Value Ref Range   B Natriuretic Peptide 380.1 (H) 0.0 - 100.0 pg/mL  Resp panel by RT-PCR (RSV, Flu A&B, Covid) Anterior Nasal Swab     Status: None   Collection Time: 10/01/23  6:05 PM   Specimen: Anterior Nasal Swab  Result Value Ref Range   SARS Coronavirus 2 by RT PCR NEGATIVE NEGATIVE   Influenza A by PCR NEGATIVE NEGATIVE   Influenza B by PCR NEGATIVE NEGATIVE   Resp Syncytial Virus by PCR NEGATIVE NEGATIVE  Troponin I (High Sensitivity)     Status: Abnormal   Collection Time: 10/01/23  7:29 PM  Result Value Ref Range   Troponin I (High Sensitivity) 34 (H) <18 ng/L  Magnesium     Status: None   Collection Time: 10/01/23  7:29 PM  Result Value Ref Range   Magnesium 1.8 1.7 - 2.4 mg/dL   Troponin I (High Sensitivity)     Status: Abnormal   Collection Time: 10/02/23  4:20 AM  Result Value Ref Range   Troponin I (High Sensitivity) 28 (H) <18 ng/L  Troponin I (High Sensitivity)     Status: Abnormal   Collection Time: 10/02/23  5:35 AM  Result Value Ref Range   Troponin I (High Sensitivity) 24 (H) <18 ng/L  CBG monitoring, ED     Status: Abnormal   Collection Time: 10/02/23  7:35 AM  Result Value Ref Range   Glucose-Capillary 106 (H) 70 - 99 mg/dL   *Note: Due to a large number of results and/or encounters for the requested time period, some results have not been displayed. A complete set of results can be found in Results Review.   DG Chest 2 View  Result Date: 10/01/2023 CLINICAL DATA:  Shortness of breath. EXAM: CHEST - 2 VIEW COMPARISON:  Chest radiograph dated 09/28/2023. FINDINGS: Minimal bibasilar atelectasis. No focal consolidation, pleural effusion, pneumothorax. Mild cardiomegaly. Median sternotomy wires and CABG vascular clips. No acute osseous pathology. IMPRESSION: 1. No active cardiopulmonary disease. 2. Mild cardiomegaly. Electronically Signed   By: Elgie Collard M.D.   On: 10/01/2023 20:00     ASSESSMENT AND PLAN: Chest pain appears to be atypical with no acute EKG changes and insignificant bump in troponin.  Patient is already on maximal medical therapy with isosorbide 120 mg and Ranexa 1000 twice daily.  Patient had unremarkable cardiac cath last month at [facility] and also at [facility] on December 2023.  Patient states he gets this chest pain like this frequently.  I I reviewed the cardiac cath done here in December last year which showed small LIMA with stent in the proximal to mid LAD having no significant restenosis and vein graft to OM having proximal stent without any significant stenosis.  He did have a high-grade lesion in the PLV and PDA branches of right coronary which may be the cause of chest pain but are too small to be treated  interventionally.  Advise medical therapy.  Will observe the patient overnight and do outpatient stress test.  Adrian Blackwater

## 2023-10-02 NOTE — ED Notes (Addendum)
Meal tray given and pt ttansported upstairs on cardiac monitor

## 2023-10-02 NOTE — Plan of Care (Signed)
  Problem: Education: Goal: Understanding of cardiac disease, CV risk reduction, and recovery process will improve Outcome: Progressing   Problem: Cardiac: Goal: Ability to achieve and maintain adequate cardiovascular perfusion will improve Outcome: Progressing   Problem: Education: Goal: Ability to describe self-care measures that may prevent or decrease complications (Diabetes Survival Skills Education) will improve Outcome: Progressing   Problem: Safety: Goal: Ability to remain free from injury will improve Outcome: Progressing

## 2023-10-02 NOTE — Progress Notes (Signed)
Patient arrived to room. VSS. Patient alert and oriented. Dinner tray provided. Call bell within reach and bed alarm on.

## 2023-10-02 NOTE — ED Notes (Signed)
Troponin sent EKG complete

## 2023-10-02 NOTE — ED Notes (Signed)
Family updated as to patient's status.

## 2023-10-02 NOTE — Progress Notes (Addendum)
  Progress Note   Patient: Lawrence [name] BJY:[id number] DOB: [birth year] DOA: 10/01/2023     0 DOS: the patient was seen and examined on 10/02/2023   Brief hospital course: Lawrence [name] is a 67 y.o. Caucasian male with medical history significant for  atrial fibrillation, anxiety, osteoarthritis, asthma, coronary artery disease, celiac disease, COPD, GERD, hypertension,, coronary artery disease status post PCI and stents placement, who presented to the emergency room with acute onset of midsternal chest pain.  The pain was severe, mid sternum, radiates to the left shoulder and arm, persistent.  Pain never went away since admission to the hospital on 11/13.  EKG did not show any ST elevation, peak troponin 34, BNP 380. Most recent heart catheter was 11/02/2022 showed 85% stenosis in RPAV1, 90% RPDA1, 85% RPDA2, ejection fraction 45 to 50%. Patient has been treated with Imdur as well as Ranexa. Patient has been seen by cardiology, no options for stents for these smaller vessel occlusion.   Active Problems:   Chest pain   Assessment and Plan: Chest pain. Coronary artery disease. Minimal elevation of troponin. Patient has significant obstruction in small vessels, could be the source of chest pain.  However it is atypical to have chest pain last for 3 days. Patient on chronic anticoagulation, no concern for PE.  Will obtain CT angiogram to rule out aortic dissection versus aneurysm. Appreciate cardiology consult, continue Imdur and Ranexa.  Chronic combined systolic and diastolic congestive heart failure. Most recent echocardiogram showed ejection fraction 30 to 35%, with grade 2 diastolic dysfunction.  Currently patient does not have volume overload, patient be followed by cardiology as outpatient.  Chronic atrial fibrillation with RVR. Heart rate much better, still in atrial fibrillation, continue Eliquis.  Currently on beta-blocker, heart rate better controlled  today.  COPD. Stable.    Subjective:  Patient still complaining of chest pain, localized in the mid chest, radiates to the left arm.  It is a persistent, has not been getting better.  Pain was not worse with deep breaths.  Physical Exam: Vitals:   10/02/23 0830 10/02/23 0900 10/02/23 0930 10/02/23 0952  BP: 108/84 102/70 115/79   Pulse: (!) 47 (!) 54 73   Resp: (!) 24 12 18    Temp:    97.6 F (36.4 C)  TempSrc:    Oral  SpO2: 95% 98% 95%   Weight:      Height:       General exam: Appears calm and comfortable  Respiratory system: Clear to auscultation. Respiratory effort normal. Cardiovascular system: Irregular, bradycardic. No JVD, murmurs, rubs, gallops or clicks. No pedal edema.  No chest wall tenderness. Gastrointestinal system: Abdomen is nondistended, soft and nontender. No organomegaly or masses felt. Normal bowel sounds heard. Central nervous system: Alert and oriented. No focal neurological deficits. Extremities: Symmetric 5 x 5 power. Skin: No rashes, lesions or ulcers Psychiatry: Judgement and insight appear normal. Mood & affect appropriate.    Data Reviewed:  Reviewed prior echocardiogram, coronary angiogram results, lab results.  X-ray results.  Family Communication: None  Disposition: Status is: Observation      Time spent: 55 minutes  Author: Marrion Coy, MD 10/02/2023 11:23 AM  For on call review [email or web address].

## 2023-10-02 NOTE — ED Notes (Signed)
Per NP Jon Billings hold Coreg for HR <60   Pt remains aox4 speaking in full clear sentences reports CP is "better" post nitroglycerin  sl and morphine ivp

## 2023-10-02 NOTE — ED Notes (Signed)
Multiple ekgs captured printed and stored on pt chart multiple secure chats sent to Dr Joylene Igo regarding significant cardiac changes

## 2023-10-03 DIAGNOSIS — I2510 Atherosclerotic heart disease of native coronary artery without angina pectoris: Secondary | ICD-10-CM | POA: Diagnosis not present

## 2023-10-03 DIAGNOSIS — I5042 Chronic combined systolic (congestive) and diastolic (congestive) heart failure: Secondary | ICD-10-CM | POA: Diagnosis not present

## 2023-10-03 DIAGNOSIS — J9611 Chronic respiratory failure with hypoxia: Secondary | ICD-10-CM | POA: Diagnosis not present

## 2023-10-03 DIAGNOSIS — R079 Chest pain, unspecified: Secondary | ICD-10-CM | POA: Diagnosis not present

## 2023-10-03 DIAGNOSIS — E119 Type 2 diabetes mellitus without complications: Secondary | ICD-10-CM | POA: Diagnosis not present

## 2023-10-03 DIAGNOSIS — I200 Unstable angina: Secondary | ICD-10-CM | POA: Diagnosis not present

## 2023-10-03 DIAGNOSIS — R0789 Other chest pain: Secondary | ICD-10-CM | POA: Diagnosis not present

## 2023-10-03 LAB — GLUCOSE, CAPILLARY
Glucose-Capillary: 138 mg/dL — ABNORMAL HIGH (ref 70–99)
Glucose-Capillary: 106 mg/dL — ABNORMAL HIGH (ref 70–99)
Glucose-Capillary: 108 mg/dL — ABNORMAL HIGH (ref 70–99)

## 2023-10-03 NOTE — Discharge Summary (Signed)
Physician Discharge Summary   Patient: Lawrence [name] MRN: [id number] DOB: 05/30/1956  Admit date:     10/01/2023  Discharge date: 10/03/23  Discharge Physician: Marrion Coy   PCP: Malva Limes, MD   Recommendations at discharge:   Follow-up with PCP in 1 week. Follow-up With cardiology in 1 to 2 weeks.  Discharge Diagnoses: Active Problems:   Chest pain   Chronic combined systolic (congestive) and diastolic (congestive) heart failure (HCC)   Type 2 diabetes mellitus without complications (HCC)   HTN (hypertension)   OSA (obstructive sleep apnea)   Chronic respiratory failure with hypoxia (HCC)   Chronic atrial fibrillation with RVR (HCC)   Overweight (BMI 25.0-29.9) Mild hiatal hernia. Resolved Problems:   * No resolved hospital problems. *  Hospital Course: Lawrence [name] is a 67 y.o. Caucasian male with medical history significant for  atrial fibrillation, anxiety, osteoarthritis, asthma, coronary artery disease, celiac disease, COPD, GERD, hypertension,, coronary artery disease status post PCI and stents placement, who presented to the emergency room with acute onset of midsternal chest pain.  The pain was severe, mid sternum, radiates to the left shoulder and arm, persistent.  Pain never went away since admission to the hospital on 11/13.  EKG did not show any ST elevation, peak troponin 34, BNP 380. Most recent heart catheter was 11/02/2022 showed 85% stenosis in RPAV1, 90% RPDA1, 85% RPDA2, ejection fraction 45 to 50%. Patient has been treated with Imdur as well as Ranexa. Patient has been seen by cardiology, no options for stents for these smaller vessel occlusion.  Assessment and Plan: Other chest pain. Coronary artery disease. Minimal elevation of troponin. Patient has significant obstruction in small vessels, could be the source of chest pain.  However it is atypical to have chest pain last for 3 days. Patient on chronic anticoagulation, no concern for PE.  Appreciate cardiology consult, continue Imdur and Ranexa. Obtain a CT scan of the chest with contrast, no aneurysm or aortic dissection.  But has small hiatal hernia.  Not sure this is a source of chest pain.  Patient has been placed on Protonix twice a day. At this point, patient chest pain appear to be stable/better.  Cardiology has cleared patient for discharge.  He will follow-up with cardiology as outpatient.   Chronic combined systolic and diastolic congestive heart failure. Most recent echocardiogram showed ejection fraction 30 to 35%, with grade 2 diastolic dysfunction.  Currently patient does not have volume overload, patient be followed by cardiology as outpatient.   Chronic atrial fibrillation with RVR. Heart rate much better, still in atrial fibrillation, continue Eliquis.  Currently on beta-blocker, heart rate better controlled today.   COPD. Chronic hypoxemic respiratory failure. Stable.       Consultants: Cardiology. Procedures performed: None  Disposition: Home Diet recommendation:  Discharge Diet Orders (From admission, onward)     Start     Ordered   10/03/23 0000  Diet - low sodium heart healthy        10/03/23 0938           Cardiac diet DISCHARGE MEDICATION: Allergies as of 10/03/2023       Reactions   Prednisone Other (See Comments), Hypertension   Pt states that this medication puts him in A-fib    Demerol  [meperidine Hcl]    Demerol [meperidine] Hives   Sulfa Antibiotics Hives   Albuterol Sulfate [albuterol] Palpitations, Other (See Comments)   Pt currently uses this medication.     Empagliflozin  Other (See Comments), Hives   Perineal pain Other Reaction(s): Other (See Comments)   Morphine Sulfate Nausea And Vomiting, Rash, Other (See Comments)   Pt states that he is only allergic to the tablet form of this medication.          Medication List     STOP taking these medications    clonazePAM 0.5 MG tablet Commonly known as:  KLONOPIN       TAKE these medications    Accu-Chek Guide Control Liqd Use with blood glucose monitor as directed   Accu-Chek Guide test strip Generic drug: glucose blood Use as instructed to check sugar daily for type 2 diabetes.   Accu-Chek Guide w/Device Kit Use to check blood sugars as directed   Accu-Chek Softclix Lancets lancets Use as instructed to check sugar daily for type 2 diabetes.   ALPRAZolam 1 MG tablet Commonly known as: XANAX Take 1 tablet (1 mg total) by mouth 3 (three) times daily as needed.   atorvastatin 80 MG tablet Commonly known as: LIPITOR TAKE 1 TABLET BY MOUTH AT BEDTIME   Breztri Aerosphere 160-9-4.8 MCG/ACT Aero Generic drug: Budeson-Glycopyrrol-Formoterol Inhale 2 puffs into the lungs 2 (two) times daily.   carvedilol 3.125 MG tablet Commonly known as: COREG Take 3.125 mg by mouth 2 (two) times daily with a meal.   cetirizine 10 MG tablet Commonly known as: ZYRTEC TAKE 1 TABLET BY MOUTH AT BEDTIME   dicyclomine 10 MG capsule Commonly known as: BENTYL Take 1 capsule (10 mg total) by mouth 3 (three) times daily as needed (abdominal pain).   Eliquis 5 MG Tabs tablet Generic drug: apixaban TAKE 1 TABLET BY MOUTH TWICE A DAY   Farxiga 10 MG Tabs tablet Generic drug: dapagliflozin propanediol Take 10 mg by mouth daily.   Ferrex 150 150 MG capsule Generic drug: iron polysaccharides TAKE 1 CAPSULE BY MOUTH DAILY   isosorbide mononitrate 120 MG 24 hr tablet Commonly known as: IMDUR TAKE 1 TABLET BY MOUTH DAILY   lidocaine 5 % Commonly known as: LIDODERM Place 1 patch onto the skin daily. Remove & Discard patch within 12 hours or as directed by MD   metFORMIN 500 MG 24 hr tablet Commonly known as: GLUCOPHAGE-XR Take 1 tablet (500 mg total) by mouth 2 (two) times daily. What changed: when to take this   methocarbamol 500 MG tablet Commonly known as: ROBAXIN TAKE 1 TABLET BY MOUTH EVERY 8 HOURS AS NEEDED FOR MUSCLE SPASMS    nitroGLYCERIN 0.4 MG SL tablet Commonly known as: NITROSTAT Place 1 tablet (0.4 mg total) under the tongue every 5 (five) minutes as needed for chest pain.   omega-3 acid ethyl esters 1 g capsule Commonly known as: LOVAZA TAKE 4 CAPSULES (4 GRAMS TOTAL) BY MOUTHDAILY   oxyCODONE-acetaminophen 10-325 MG tablet Commonly known as: PERCOCET TAKE 1 TABLET BY MOUTH EVERY 4 HOURS AS NEEDED FOR PAIN   OXYGEN Inhale 2 L into the lungs at bedtime as needed (for shortness of breath).   pantoprazole 40 MG tablet Commonly known as: PROTONIX Take 1 tablet (40 mg total) by mouth 2 (two) times daily for 14 days.   ranolazine 1000 MG SR tablet Commonly known as: RANEXA Take 1 tablet (1,000 mg total) by mouth 2 (two) times daily.   spironolactone 25 MG tablet Commonly known as: ALDACTONE Take 1 tablet by mouth daily.   traZODone 100 MG tablet Commonly known as: DESYREL Take 1 tablet (100 mg total) by mouth at bedtime.   venlafaxine  XR 75 MG 24 hr capsule Commonly known as: EFFEXOR-XR Take 75 mg by mouth daily with breakfast.   Vitamin B-12 1000 MCG Subl Place 1 tablet under the tongue daily.        Follow-up Information     Malva Limes, MD Follow up in 1 week(s).   Specialty: Family Medicine Contact information: [address] [place] [address] [phone]         Kathryne Gin, MD Follow up in 1 week(s).   Specialty: Cardiology Contact information: [address] [place] [address] [phone]                Discharge Exam: Ceasar Mons Weights   10/01/23 1719 10/02/23 2026  Weight: 83.5 kg 82.9 kg   General exam: Appears calm and comfortable  Respiratory system: Clear to auscultation. Respiratory effort normal. Cardiovascular system: Irregular. No JVD, murmurs, rubs, gallops or clicks. No pedal edema. Gastrointestinal system: Abdomen is nondistended, soft and nontender. No organomegaly or masses felt. Normal bowel sounds  heard. Central nervous system: Alert and oriented. No focal neurological deficits. Extremities: Symmetric 5 x 5 power. Skin: No rashes, lesions or ulcers Psychiatry: Judgement and insight appear normal. Mood & affect appropriate.    Condition at discharge: good  The results of significant diagnostics from this hospitalization (including imaging, microbiology, ancillary and laboratory) are listed below for reference.   Imaging Studies: CT Angio Chest/Abd/Pel for Dissection W and/or W/WO  Result Date: 10/02/2023 CLINICAL DATA:  Aortic aneurysm suspected EXAM: CT ANGIOGRAPHY CHEST, ABDOMEN AND PELVIS TECHNIQUE: Non-contrast CT of the chest was initially obtained. Multidetector CT imaging through the chest, abdomen and pelvis was performed using the standard protocol during bolus administration of intravenous contrast. Multiplanar reconstructed images and MIPs were obtained and reviewed to evaluate the vascular anatomy. RADIATION DOSE REDUCTION: This exam was performed according to the departmental dose-optimization program which includes automated exposure control, adjustment of the mA and/or kV according to patient size and/or use of iterative reconstruction technique. CONTRAST:  OMNIPAQUE IOHEXOL 350 MG/ML SOLN COMPARISON:  CTA CAP, 06/08/2023 and 05/06/2023. FINDINGS: CTA CHEST FINDINGS Cardiovascular: Preferential opacification of the thoracic aorta. No evidence of acute thoracic aortic aneurysm or dissection. 3.6 cm ascending thoracic aorta. No segmental or larger pulmonary embolus. Biventricular cardiac enlargement. No pericardial effusion. Moderate burden of calcified coronary atherosclerosis, greatest within the LAD. Mediastinum/Nodes: No enlarged mediastinal, hilar, or axillary lymph nodes. Thyroid gland, trachea, and esophagus demonstrate no significant findings. Lungs/Pleura: Apically-predominant mild centrilobular emphysematous lung change. 8 mm middle lobe pulmonary nodule. Bibasilar  dependent opacities without discrete consolidation. No pleural effusion or pneumothorax. Musculoskeletal: Median sternotomy change. Increased AP thoracic diameter. No acute chest wall abnormality. Review of the MIP images confirms the above findings. CTA ABDOMEN AND PELVIS FINDINGS VASCULAR Aorta: Moderate burden of atherosclerosis. Normal caliber aorta without aneurysm, dissection, vasculitis or significant stenosis. Celiac: Patent without evidence of aneurysm, dissection, vasculitis or significant stenosis. SMA: Patent without evidence of aneurysm, dissection, vasculitis or significant stenosis. Renals: Single renal arteries are present. Both renal arteries are patent without evidence of aneurysm, dissection, vasculitis, fibromuscular dysplasia or significant stenosis. IMA: Patent without evidence of aneurysm, dissection, vasculitis or significant stenosis. Pelvis: Patent without evidence of aneurysm, dissection, vasculitis or significant stenosis. Veins: No obvious venous abnormality within the limitations of this arterial phase study. Review of the MIP images confirms the above findings. NON-VASCULAR Hepatobiliary: No focal liver abnormality is seen. No gallstones, gallbladder wall thickening, or biliary dilatation. Pancreas: No  pancreatic ductal dilatation or surrounding inflammatory changes. Spleen: Normal in size without focal abnormality. Adrenals/Urinary Tract: Adrenal glands are unremarkable. Kidneys are normal, without renal calculi, focal lesion, or hydronephrosis. Bladder is unremarkable. Stomach/Bowel: Layering fluid within the mid-to-distal esophagus. Small hiatus hernia. Stomach is nondistended. Appendix appears normal. Nonobstructed small bowel. Nondilated colon. Sigmoid diverticulosis. No evidence of bowel wall thickening, distention, or inflammatory changes. Lymphatic: No enlarged abdominal or pelvic lymph nodes. Reproductive: Prostate is unremarkable. Scrotal tissue thickening greater on LEFT.  Testicular calcifications Other: Small fat-containing LEFT inguinal hernia versus cord lipoma. No abdominal wall hernia or abnormality. No abdominopelvic ascites. Musculoskeletal: No acute or significant osseous findings. Review of the MIP images confirms the above findings. IMPRESSION: 1. No acute vascular or nonvascular abnormality within the chest, abdomen or pelvis. 2. Background of mild centrilobular emphysema with a solid 8 mm middle lobe pulmonary nodule. Per Fleischner Society Guidelines, recommend a non-contrast Chest CT at 6-12 months. 3. Asymmetric thickening of the LEFT scrotum, incompletely imaged/assessed on this study. Correlate with examination 4. Aortic Atherosclerosis (ICD10-I70.0) and Emphysema (ICD10-J43.9), small hiatus hernia and sigmoid diverticulosis. Additional incidental, chronic and senescent findings as above. Electronically Signed   By: Roanna Banning M.D.   On: 10/02/2023 12:12   DG Chest 2 View  Result Date: 10/01/2023 CLINICAL DATA:  Shortness of breath. EXAM: CHEST - 2 VIEW COMPARISON:  Chest radiograph dated 09/28/2023. FINDINGS: Minimal bibasilar atelectasis. No focal consolidation, pleural effusion, pneumothorax. Mild cardiomegaly. Median sternotomy wires and CABG vascular clips. No acute osseous pathology. IMPRESSION: 1. No active cardiopulmonary disease. 2. Mild cardiomegaly. Electronically Signed   By: Elgie Collard M.D.   On: 10/01/2023 20:00   DG Chest Portable 1 View  Result Date: 09/29/2023 CLINICAL DATA:  Status post fall. EXAM: PORTABLE CHEST 1 VIEW COMPARISON:  September 10, 2023 FINDINGS: Multiple sternal wires and vascular clips are seen. The heart size and mediastinal contours are within normal limits. A coronary artery stent is in place. Both lungs are clear. Multilevel degenerative changes seen throughout the thoracic spine. IMPRESSION: 1. Evidence of prior median sternotomy/CABG. 2. No acute cardiopulmonary disease. Electronically Signed   By: Aram Candela M.D.   On: 09/29/2023 00:19   DG Pelvis Portable  Result Date: 09/29/2023 CLINICAL DATA:  Status post fall. EXAM: PORTABLE PELVIS 1-2 VIEWS COMPARISON:  June 15, 2023 FINDINGS: There is no evidence of pelvic fracture or diastasis. No pelvic bone lesions are seen. Moderate to marked severity vascular calcification is noted. IMPRESSION: No acute osseous abnormality. Electronically Signed   By: Aram Candela M.D.   On: 09/29/2023 00:17   CT Hip Left Wo Contrast  Result Date: 09/24/2023 CLINICAL DATA:  Left hip pain after a fall. EXAM: CT OF THE LEFT HIP WITHOUT CONTRAST TECHNIQUE: Multidetector CT imaging of the left hip was performed according to the standard protocol. Multiplanar CT image reconstructions were also generated. RADIATION DOSE REDUCTION: This exam was performed according to the departmental dose-optimization program which includes automated exposure control, adjustment of the mA and/or kV according to patient size and/or use of iterative reconstruction technique. COMPARISON:  X-ray from earlier same day FINDINGS: Bones/Joint/Cartilage No left femoral neck fracture. Left acetabulum is intact. The symphysis pubis and left SI joint are unremarkable. No fracture within the visualized left iliac bone. No evidence for acute fracture within the visualized portion of the left sacrum. Ligaments Suboptimally assessed by CT. Muscles and Tendons No evidence for muscle abnormality in the visualized anatomy around the left hip. Soft tissues  No evidence for intramuscular or deep soft tissue hematoma in or about the left hip. No free fluid identified within the visualized peritoneal cavity. No substantial subcutaneous contusion. IMPRESSION: No acute bony or soft tissue findings to explain the patient's history of pain. Electronically Signed   By: Kennith Center M.D.   On: 09/24/2023 05:07   CT Cervical Spine Wo Contrast  Result Date: 09/24/2023 CLINICAL DATA:  67 year old male status post  syncope, fall. EXAM: CT CERVICAL SPINE WITHOUT CONTRAST TECHNIQUE: Multidetector CT imaging of the cervical spine was performed without intravenous contrast. Multiplanar CT image reconstructions were also generated. RADIATION DOSE REDUCTION: This exam was performed according to the departmental dose-optimization program which includes automated exposure control, adjustment of the mA and/or kV according to patient size and/or use of iterative reconstruction technique. COMPARISON:  Head CT reported separately today. Previous cervical spine CT 06/15/2023. FINDINGS: Alignment: Straightening but slightly improved cervical lordosis compared to July. Cervicothoracic junction alignment is within normal limits. Bilateral posterior element alignment is within normal limits. Skull base and vertebrae: Visualized skull base is intact. No atlanto-occipital dissociation. C1 and C2 appear intact and aligned. Stable visualized osseous structures. A circumscribed lucent area in the left C3 body is stable since 12/26/2022 and most likely benign. Some underlying generalized osteopenia. Soft tissues and spinal canal: No prevertebral fluid or swelling. No visible canal hematoma. Chronic calcified cervical carotid atherosclerosis, partially retropharyngeal course of the right carotid. Disc levels: Mild for age cervical spine degeneration appears stable. Upper chest: Partially visible sternotomy. Visible upper thoracic levels appear intact. Upper lobe emphysema. Calcified aortic atherosclerosis. Other: Absent dentition. Visualized paranasal sinuses and mastoids are stable and well aerated. IMPRESSION: 1. No acute traumatic injury identified in the cervical spine. 2. Stable osteopenia and mild for age cervical spine degeneration. 3.  Emphysema (ICD10-J43.9). Electronically Signed   By: Odessa Fleming M.D.   On: 09/24/2023 04:38   CT HEAD WO CONTRAST ( )  Result Date: 09/24/2023 CLINICAL DATA:  Head trauma EXAM: CT HEAD WITHOUT CONTRAST  TECHNIQUE: Contiguous axial images were obtained from the base of the skull through the vertex without intravenous contrast. RADIATION DOSE REDUCTION: This exam was performed according to the departmental dose-optimization program which includes automated exposure control, adjustment of the mA and/or kV according to patient size and/or use of iterative reconstruction technique. COMPARISON:  None Available. FINDINGS: Brain: There is no mass, hemorrhage or extra-axial collection. There is hypoattenuation of the supratentorial white matter. CSF spaces are normal. Vascular: No hyperdense vessel or unexpected calcification. Skull: Normal. Negative for fracture or focal lesion. Sinuses/Orbits: No acute finding. Other: None. IMPRESSION: 1. No acute intracranial abnormality. 2. Findings of chronic small vessel ischemia. Electronically Signed   By: Deatra Robinson M.D.   On: 09/24/2023 01:16   DG Hip Unilat W or Wo Pelvis 2-3 Views Left  Result Date: 09/24/2023 CLINICAL DATA:  Left hip pain after fall. EXAM: DG HIP (WITH OR WITHOUT PELVIS) 2-3V LEFT COMPARISON:  None Available. FINDINGS: No acute fracture of the pelvis or left hip. Femoral head is well seated, no hip dislocation. Pubic rami are intact. No pubic symphyseal or sacroiliac diastasis. Mild bilateral hip degenerative change. There are prominent vascular calcifications. IMPRESSION: 1. No acute fracture or subluxation of the pelvis or left hip. 2. Mild bilateral hip degenerative change. Electronically Signed   By: Narda Rutherford M.D.   On: 09/24/2023 01:13   DG Chest 2 View  Result Date: 09/10/2023 CLINICAL DATA:  Chest pain EXAM: CHEST - 2  VIEW COMPARISON:  09/10/2023 coronary artery bypass FINDINGS: Lungs are well expanded, symmetric, and clear. No pneumothorax or pleural effusion. Cardiac size within normal limits. Grafting has been performed. Pulmonary vascularity is normal. Osseous structures are age-appropriate. No acute bone abnormality. IMPRESSION:  No active cardiopulmonary disease. Electronically Signed   By: Helyn Numbers M.D.   On: 09/10/2023 23:48   DG Chest Portable 1 View  Result Date: 09/10/2023 CLINICAL DATA:  Palpitations and shortness of breath EXAM: PORTABLE CHEST 1 VIEW COMPARISON:  Chest x-ray 08/11/2023 FINDINGS: Sternotomy wires are present. The heart size and mediastinal contours are within normal limits. Both lungs are clear. The visualized skeletal structures are unremarkable. IMPRESSION: No active disease. Electronically Signed   By: Darliss Cheney M.D.   On: 09/10/2023 01:14    Microbiology: Results for orders placed or performed during the hospital encounter of 10/01/23  Resp panel by RT-PCR (RSV, Flu A&B, Covid) Anterior Nasal Swab     Status: None   Collection Time: 10/01/23  6:05 PM   Specimen: Anterior Nasal Swab  Result Value Ref Range Status   SARS Coronavirus 2 by RT PCR NEGATIVE NEGATIVE Final    Comment: (NOTE) SARS-CoV-2 target nucleic acids are NOT DETECTED.  The SARS-CoV-2 RNA is generally detectable in upper respiratory specimens during the acute phase of infection. The lowest concentration of SARS-CoV-2 viral copies this assay can detect is 138 copies/mL. A negative result does not preclude SARS-Cov-2 infection and should not be used as the sole basis for treatment or other patient management decisions. A negative result may occur with  improper specimen collection/handling, submission of specimen other than nasopharyngeal swab, presence of viral mutation(s) within the areas targeted by this assay, and inadequate number of viral copies(<138 copies/mL). A negative result must be combined with clinical observations, patient history, and epidemiological information. The expected result is Negative.  Fact Sheet for Patients:  BloggerCourse.com  Fact Sheet for Healthcare Providers:  SeriousBroker.it  This test is no t yet approved or cleared by  the [place] FDA and  has been authorized for detection and/or diagnosis of SARS-CoV-2 by FDA under an Emergency Use Authorization (EUA). This EUA will remain  in effect (meaning this test can be used) for the duration of the COVID-19 declaration under Section 564(b)(1) of the Act, 21 U.S.C.section 360bbb-3(b)(1), unless the authorization is terminated  or revoked sooner.       Influenza A by PCR NEGATIVE NEGATIVE Final   Influenza B by PCR NEGATIVE NEGATIVE Final    Comment: (NOTE) The Xpert Xpress SARS-CoV-2/FLU/RSV plus assay is intended as an aid in the diagnosis of influenza from Nasopharyngeal swab specimens and should not be used as a sole basis for treatment. Nasal washings and aspirates are unacceptable for Xpert Xpress SARS-CoV-2/FLU/RSV testing.  Fact Sheet for Patients: BloggerCourse.com  Fact Sheet for Healthcare Providers: SeriousBroker.it  This test is not yet approved or cleared by the [place] FDA and has been authorized for detection and/or diagnosis of SARS-CoV-2 by FDA under an Emergency Use Authorization (EUA). This EUA will remain in effect (meaning this test can be used) for the duration of the COVID-19 declaration under Section 564(b)(1) of the Act, 21 U.S.C. section 360bbb-3(b)(1), unless the authorization is terminated or revoked.     Resp Syncytial Virus by PCR NEGATIVE NEGATIVE Final    Comment: (NOTE) Fact Sheet for Patients: BloggerCourse.com  Fact Sheet for Healthcare Providers: SeriousBroker.it  This test is not yet approved or cleared by the [place] FDA  and has been authorized for detection and/or diagnosis of SARS-CoV-2 by FDA under an Emergency Use Authorization (EUA). This EUA will remain in effect (meaning this test can be used) for the duration of the COVID-19 declaration under Section 564(b)(1) of the Act, 21  U.S.C. section 360bbb-3(b)(1), unless the authorization is terminated or revoked.  Performed at [facility], [address] [place], [place] [address]    *Note: Due to a large number of results and/or encounters for the requested time period, some results have not been displayed. A complete set of results can be found in Results Review.    Labs: CBC: Recent Labs  Lab 09/28/23 2204 09/29/23 0125 10/01/23 1722  WBC 8.7 8.4 6.0  NEUTROABS 5.9  --  4.1  HGB 17.4* 15.8 16.5  HCT 53.7* 49.2 51.4  MCV 91.5 92.1 92.1  PLT 218 187 175   Basic Metabolic Panel: Recent Labs  Lab 09/28/23 2204 09/29/23 0125 09/30/23 0406 10/01/23 1722 10/01/23 1929  NA 140 143 136 138  --   K 3.5 4.1 4.2 4.3  --   CL 98 103 104 105  --   CO2 30 31 28 28   --   GLUCOSE 172* 136* 111* 103*  --   BUN 17 20 21 17   --   CREATININE 1.95* 1.77* 1.19 0.77  --   CALCIUM 9.8 9.2 8.8* 9.1  --   MG  --   --  1.8  --  1.8  PHOS  --   --  4.0  --   --    Liver Function Tests: Recent Labs  Lab 09/28/23 2204  AST 16  ALT 15  ALKPHOS 63  BILITOT 1.3*  PROT 7.1  ALBUMIN 4.2   CBG: Recent Labs  Lab 10/02/23 1624 10/02/23 2044 10/02/23 2309 10/03/23 0507 10/03/23 0849  GLUCAP 93 104* 158* 138* 106*    Discharge time spent: greater than 30 minutes.  Signed: Marrion Coy, MD [facility] 10/03/2023

## 2023-10-03 NOTE — Progress Notes (Signed)
SUBJECTIVE: Patient still has occasional chest pain.  Chest pain is sharp and not pressure type.   Vitals:   10/03/23 0009 10/03/23 0346 10/03/23 0513 10/03/23 0738  BP: 97/65 90/60 100/66 93/68  Pulse: 76 62  (!) 56  Resp: 19 20  18   Temp: 97.6 F (36.4 C) 98.3 F (36.8 C)  97.8 F (36.6 C)  TempSrc: Oral Oral  Oral  SpO2: 98% 96%  95%  Weight:      Height:        Intake/Output Summary (Last 24 hours) at 10/03/2023 0841 Last data filed at 10/03/2023 1610 Gross per 24 hour  Intake --  Output 1825 ml  Net -1825 ml    LABS: Basic Metabolic Panel: Recent Labs    10/01/23 1722 10/01/23 1929  NA 138  --   K 4.3  --   CL 105  --   CO2 28  --   GLUCOSE 103*  --   BUN 17  --   CREATININE 0.77  --   CALCIUM 9.1  --   MG  --  1.8   Liver Function Tests: No results for input(s): "AST", "ALT", "ALKPHOS", "BILITOT", "PROT", "ALBUMIN" in the last 72 hours. No results for input(s): "LIPASE", "AMYLASE" in the last 72 hours. CBC: Recent Labs    10/01/23 1722  WBC 6.0  NEUTROABS 4.1  HGB 16.5  HCT 51.4  MCV 92.1  PLT 175   Cardiac Enzymes: No results for input(s): "CKTOTAL", "CKMB", "CKMBINDEX", "TROPONINI" in the last 72 hours. BNP: Invalid input(s): "POCBNP" D-Dimer: No results for input(s): "DDIMER" in the last 72 hours. Hemoglobin A1C: No results for input(s): "HGBA1C" in the last 72 hours. Fasting Lipid Panel: No results for input(s): "CHOL", "HDL", "LDLCALC", "TRIG", "CHOLHDL", "LDLDIRECT" in the last 72 hours. Thyroid Function Tests: No results for input(s): "TSH", "T4TOTAL", "T3FREE", "THYROIDAB" in the last 72 hours.  Invalid input(s): "FREET3" Anemia Panel: No results for input(s): "VITAMINB12", "FOLATE", "FERRITIN", "TIBC", "IRON", "RETICCTPCT" in the last 72 hours.   PHYSICAL EXAM General: Well developed, well nourished, in no acute distress HEENT:  Normocephalic and atramatic Neck:  No JVD.  Lungs: Clear bilaterally to auscultation and  percussion. Heart: HRRR . Normal S1 and S2 without gallops or murmurs.  Abdomen: Bowel sounds are positive, abdomen soft and non-tender  Msk:  Back normal, normal gait. Normal strength and tone for age. Extremities: No clubbing, cyanosis or edema.   Neuro: Alert and oriented X 3. Psych:  Good affect, responds appropriately  TELEMETRY: Normal sinus rhythm  ASSESSMENT AND PLAN: Atypical chest pain with no significant bump in cardiac enzymes and no acute EKG changes.  Patient has a history of CABG and had 2 cardiac catheterization recently with the last 1 a month ago at [facility].  He has small diffusely diseased PLV and PDA branches of RCA which may be cause of his chest pain.  His stent in OM1 graft as well as mid LAD were having no significant restenosis on the last 2 cardiac catheterization.  Advise continue Ranexa 1000 twice daily and isosorbide 120 mg once a day.  Follow-up for possible nuclear stress test at [facility] cardiology in 1 to 2 weeks.  Patient can be discharged.   ICD-10-CM   1. Chest pain, unspecified type  R07.9       Active Problems:   OSA (obstructive sleep apnea)   Chronic combined systolic (congestive) and diastolic (congestive) heart failure (HCC)   Type 2 diabetes mellitus without complications (HCC)  HTN (hypertension)   Chronic atrial fibrillation with RVR (HCC)   Chest pain   Overweight (BMI 25.0-29.9)    Lawrence Blackwater, MD, [facility] 10/03/2023 8:41 AM

## 2023-10-04 ENCOUNTER — Ambulatory Visit: Payer: Medicare HMO | Admitting: Family Medicine

## 2023-10-04 ENCOUNTER — Telehealth: Payer: Self-pay

## 2023-10-04 MED ORDER — APIXABAN 5 MG PO TABS: 5.0000 mg | ORAL_TABLET | Freq: Two times a day (BID) | ORAL | 5 refills | Status: AC

## 2023-10-04 NOTE — Progress Notes (Signed)
  Care Coordination  Outreach Note  10/04/2023 Name: Lawrence [name] MRN: [id number] DOB: August 23, 1956   Care Coordination Outreach Attempts: A second unsuccessful outreach was attempted today to offer the patient with information about available care coordination services.  Follow Up Plan:  Additional outreach attempts will be made to offer the patient care coordination information and services.   Encounter Outcome:  No Answer  Burman Nieves, CCMA Care Coordination Care Guide Direct Dial: [phone]

## 2023-10-04 NOTE — Transitions of Care (Post Inpatient/ED Visit) (Unsigned)
   10/04/2023  Name: Lawrence [name] MRN: [id number] DOB: 1956/01/14  Today's TOC FU Call Status: Today's TOC FU Call Status:: Unsuccessful Call (1st Attempt) Unsuccessful Call (1st Attempt) Date: 10/04/23  Attempted to reach the patient regarding the most recent Inpatient/ED visit.  Follow Up Plan: Additional outreach attempts will be made to reach the patient to complete the Transitions of Care (Post Inpatient/ED visit) call.   Signature Karena Addison, LPN [facility] Nurse Health Advisor Direct Dial [phone]

## 2023-10-05 DIAGNOSIS — E785 Hyperlipidemia, unspecified: Secondary | ICD-10-CM | POA: Diagnosis not present

## 2023-10-05 DIAGNOSIS — I4891 Unspecified atrial fibrillation: Secondary | ICD-10-CM | POA: Diagnosis not present

## 2023-10-05 DIAGNOSIS — R079 Chest pain, unspecified: Secondary | ICD-10-CM | POA: Diagnosis not present

## 2023-10-05 DIAGNOSIS — E119 Type 2 diabetes mellitus without complications: Secondary | ICD-10-CM | POA: Diagnosis not present

## 2023-10-05 DIAGNOSIS — N400 Enlarged prostate without lower urinary tract symptoms: Secondary | ICD-10-CM | POA: Diagnosis not present

## 2023-10-05 DIAGNOSIS — G4733 Obstructive sleep apnea (adult) (pediatric): Secondary | ICD-10-CM | POA: Diagnosis not present

## 2023-10-05 DIAGNOSIS — R0789 Other chest pain: Secondary | ICD-10-CM | POA: Diagnosis not present

## 2023-10-05 DIAGNOSIS — I10 Essential (primary) hypertension: Secondary | ICD-10-CM | POA: Diagnosis not present

## 2023-10-05 DIAGNOSIS — I2510 Atherosclerotic heart disease of native coronary artery without angina pectoris: Secondary | ICD-10-CM | POA: Diagnosis not present

## 2023-10-05 DIAGNOSIS — J449 Chronic obstructive pulmonary disease, unspecified: Secondary | ICD-10-CM | POA: Diagnosis not present

## 2023-10-05 NOTE — Transitions of Care (Post Inpatient/ED Visit) (Signed)
   10/05/2023  Name: Lawrence [name] MRN: [id number] DOB: 05/08/1956  Today's TOC FU Call Status: Today's TOC FU Call Status:: Unsuccessful Call (1st Attempt) Unsuccessful Call (1st Attempt) Date: 10/04/23  Attempted to reach the patient regarding the most recent Inpatient/ED visit. Patient already seen in office Follow Up Plan: No further outreach attempts will be made at this time. We have been unable to contact the patient.  Signature Karena Addison, LPN [facility] Nurse Health Advisor Direct Dial [phone]

## 2023-10-06 ENCOUNTER — Other Ambulatory Visit: Payer: Self-pay

## 2023-10-06 DIAGNOSIS — R079 Chest pain, unspecified: Secondary | ICD-10-CM | POA: Diagnosis not present

## 2023-10-06 NOTE — Patient Outreach (Signed)
  Care Management   Visit Note  10/06/2023 Name: JAQUILLE STICKLE MRN: [id number] DOB: 1956/06/07  Subjective: SOUA IBARRA is a 67 y.o. year old male who is a primary care patient of [name], Demetrios Isaacs, MD. The Care Management team was consulted for assistance.

## 2023-10-07 ENCOUNTER — Telehealth: Payer: Self-pay

## 2023-10-07 ENCOUNTER — Other Ambulatory Visit: Payer: Self-pay

## 2023-10-07 NOTE — Transitions of Care (Post Inpatient/ED Visit) (Signed)
10/07/2023  Name: Lawrence [name] MRN: [id number] DOB: 07-16-1956  Today's TOC FU Call Status: Today's TOC FU Call Status:: Successful TOC FU Call Completed TOC FU Call Complete Date: 10/07/23 Patient's Name and Date of Birth confirmed.  Transition Care Management Follow-up Telephone Call Date of Discharge: 10/06/23 Discharge Facility: Other [facility]) Name of Other (Non[facility]) Discharge Facility: [facility] Type of Discharge: Emergency Department Reason for ED Visit: Other: (chest pain) How have you been since you were released from the hospital?: Better Any questions or concerns?: No  Items Reviewed: Did you receive and understand the discharge instructions provided?: Yes Medications obtained,verified, and reconciled?: Yes (Medications Reviewed) Any new allergies since your discharge?: No Dietary orders reviewed?: Yes Do you have support at home?: Yes People in Home: grandchild(ren)  Medications Reviewed Today: Medications Reviewed Today     Reviewed by Karena Addison, LPN (Licensed Practical Nurse) on 10/07/23 at 1124  Med List Status: <None>   Medication Order Taking? Sig Documenting Provider Last Dose Status Informant  Accu-Chek Softclix Lancets lancets [id number] No Use as instructed to check sugar daily for type 2 diabetes. Malva Limes, MD Taking Active Pharmacy Records, Family Member  ALPRAZolam Prudy Feeler) 1 MG tablet [id number] No Take 1 tablet (1 mg total) by mouth 3 (three) times daily as needed. Malva Limes, MD Past Week Active Family Member, Pharmacy Records  apixaban [facility]) 5 MG TABS tablet [id number]  Take 1 tablet (5 mg total) by mouth 2 (two) times daily. Malva Limes, MD  Active   atorvastatin (LIPITOR) 80 MG tablet [id number] No TAKE 1 TABLET BY MOUTH AT BEDTIME Malva Limes, MD Past Week Active Pharmacy Records, Family Member  Blood Glucose Calibration (ACCU-CHEK GUIDE CONTROL) [name] [id number] No Use with blood glucose monitor as  directed [name], Demetrios Isaacs, MD Taking Active Pharmacy Records, Family Member  Blood Glucose Monitoring Suppl (ACCU-CHEK GUIDE) w/Device Andria Rhein [id number] No Use to check blood sugars as directed [name], Demetrios Isaacs, MD Taking Active Pharmacy Records, Family Member  Budeson-Glycopyrrol-Formoterol [facility] AEROSPHERE) 160-9-4.8 MCG/ACT AERO [id number] No Inhale 2 puffs into the lungs 2 (two) times daily. Malva Limes, MD Past Week Active Pharmacy Records, Family Member  carvedilol (COREG) 3.125 MG tablet [id number] No Take 3.125 mg by mouth 2 (two) times daily with a meal. Lovenia Shuck, MD Past Week Active Family Member, Pharmacy Records  cetirizine (ZYRTEC) 10 MG tablet [id number] No TAKE 1 TABLET BY MOUTH AT BEDTIME Malva Limes, MD Past Week Active Pharmacy Records, Family Member  Cyanocobalamin (VITAMIN B-12) 1000 MCG SUBL [id number] No Place 1 tablet under the tongue daily. [provider] Past Week Active Family Member, Pharmacy Records  dicyclomine (BENTYL) 10 MG capsule [id number] No Take 1 capsule (10 mg total) by mouth 3 (three) times daily as needed (abdominal pain). Phineas Semen, MD prn unknown Active Pharmacy Records, Family Member  FARXIGA 10 MG TABS tablet [id number] No Take 10 mg by mouth daily. [provider] Past Week Active Pharmacy Records, Family Member  glucose blood (ACCU-CHEK GUIDE) test strip [id number] No Use as instructed to check sugar daily for type 2 diabetes. Malva Limes, MD Taking Active Pharmacy Records, Family Member  iron polysaccharides (FERREX 150) 150 MG capsule [id number] No TAKE 1 CAPSULE BY MOUTH DAILY [name], Demetrios Isaacs, MD Past Week Active Family Member, Pharmacy Records  isosorbide mononitrate (IMDUR) 120 MG 24 hr tablet [id number] No TAKE 1 TABLET BY MOUTH DAILY Malva Limes, MD prn unknown Active Family Member,  Pharmacy Records           Med Note Sharia Reeve   WVP Oct 02, 2023  8:42 AM) Hold if SBP <100  lidocaine (LIDODERM) 5 %  [id number] No Place 1 patch onto the skin daily. Remove & Discard patch within 12 hours or as directed by MD Arnetha Courser, MD Taking Active Family Member, Pharmacy Records  metFORMIN (GLUCOPHAGE-XR) 500 MG 24 hr tablet [id number] No Take 1 tablet (500 mg total) by mouth 2 (two) times daily.  Patient taking differently: Take 500 mg by mouth daily.   Jacky Kindle, FNP Past Week Active Pharmacy Records, Family Member           Med Note [facility], Robyn Haber   Fri Oct 01, 2023  4:16 PM) Reports taking one a day  methocarbamol (ROBAXIN) 500 MG tablet [id number] No TAKE 1 TABLET BY MOUTH EVERY 8 HOURS AS NEEDED FOR MUSCLE SPASMS Malva Limes, MD prn unknown Active Pharmacy Records, Family Member           Med Note [place], Marjory Lies   Thu Aug 26, 2023  1:48 PM) Up to 4x a day  nitroGLYCERIN (NITROSTAT) 0.4 MG SL tablet [id number] No Place 1 tablet (0.4 mg total) under the tongue every 5 (five) minutes as needed for chest pain. Malva Limes, MD prn unknown Active Family Member, Pharmacy Records  omega-3 acid ethyl esters (LOVAZA) 1 g capsule [id number] No TAKE 4 CAPSULES (4 GRAMS TOTAL) BY Miachel Roux, MD Past Week Active Pharmacy Records, Family Member  oxyCODONE-acetaminophen [facility]) 10-325 MG tablet [id number] No TAKE 1 TABLET BY MOUTH EVERY 4 HOURS AS NEEDED FOR PAIN Malva Limes, MD 10/01/2023 Active Family Member, Pharmacy Records  OXYGEN [id number] No Inhale 2 L into the lungs at bedtime as needed (for shortness of breath). [provider] Taking Active Family Member, Pharmacy Records  pantoprazole (PROTONIX) 40 MG tablet [id number] No Take 1 tablet (40 mg total) by mouth 2 (two) times daily for 14 days. Celso Amy, PA-C Past Week Expired 08/07/23 2359 Self, Pharmacy Records           Med Note Sharia Reeve   YPP Oct 02, 2023  8:38 AM)    ranolazine (RANEXA) 1000 MG SR tablet [id number] No Take 1 tablet (1,000 mg total) by mouth 2 (two) times daily. Marcelino Duster, MD Past Week Active Family Member, Pharmacy Records  spironolactone (ALDACTONE) 25 MG tablet [id number] No Take 1 tablet by mouth daily. [provider] Past Week Active Family Member, Pharmacy Records  traZODone (DESYREL) 100 MG tablet [id number] No Take 1 tablet (100 mg total) by mouth at bedtime. Enedina Finner, MD Past Week Active Family Member, Pharmacy Records  venlafaxine XR (EFFEXOR-XR) 75 MG 24 hr capsule [id number] No Take 75 mg by mouth daily with breakfast. [provider] Past Week Active Family Member, Pharmacy Records            Home Care and Equipment/Supplies: Were Home Health Services Ordered?: NA Any new equipment or medical supplies ordered?: NA  Functional Questionnaire: Do you need assistance with bathing/showering or dressing?: No Do you need assistance with meal preparation?: No Do you need assistance with eating?: No Do you have difficulty maintaining continence: No Do you need assistance with getting out of bed/getting out of a chair/moving?: No Do you have difficulty managing or taking your medications?: No  Follow up appointments reviewed: PCP Follow-up appointment confirmed?: Yes Date of PCP follow-up  appointment?: 10/11/23 Follow-up Provider: [facility] Follow-up appointment confirmed?: NA Do you need transportation to your follow-up appointment?: No Do you understand care options if your condition(s) worsen?: Yes-patient verbalized understanding    SIGNATURE Karena Addison, LPN [facility] Nurse Health Advisor Direct Dial [phone]

## 2023-10-11 ENCOUNTER — Other Ambulatory Visit: Payer: Self-pay | Admitting: Family Medicine

## 2023-10-11 ENCOUNTER — Ambulatory Visit: Payer: Medicare HMO | Admitting: Family Medicine

## 2023-10-11 NOTE — Patient Outreach (Signed)
Care Management   Visit Note   Name: Lawrence [name] MRN: [id number] DOB: 22-Oct-1956  Subjective: Lawrence [name] is a 67 y.o. year old male who is a primary care patient of [name], Demetrios Isaacs, MD. The Care Management team was consulted for assistance.      Engaged with patient and grandson Lawrence [name] via telephone  Assessment:  Outpatient Encounter Medications as of 10/07/2023  Medication Sig Note   Accu-Chek Softclix Lancets lancets Use as instructed to check sugar daily for type 2 diabetes.    ALPRAZolam (XANAX) 1 MG tablet Take 1 tablet (1 mg total) by mouth 3 (three) times daily as needed.    apixaban (ELIQUIS) 5 MG TABS tablet Take 1 tablet (5 mg total) by mouth 2 (two) times daily.    atorvastatin (LIPITOR) 80 MG tablet TAKE 1 TABLET BY MOUTH AT BEDTIME    Blood Glucose Calibration (ACCU-CHEK GUIDE CONTROL) LIQD Use with blood glucose monitor as directed    Blood Glucose Monitoring Suppl (ACCU-CHEK GUIDE) w/Device KIT Use to check blood sugars as directed    Budeson-Glycopyrrol-Formoterol (BREZTRI AEROSPHERE) 160-9-4.8 MCG/ACT AERO Inhale 2 puffs into the lungs 2 (two) times daily.    carvedilol (COREG) 3.125 MG tablet Take 3.125 mg by mouth 2 (two) times daily with a meal.    cetirizine (ZYRTEC) 10 MG tablet TAKE 1 TABLET BY MOUTH AT BEDTIME    Cyanocobalamin (VITAMIN B-12) 1000 MCG SUBL Place 1 tablet under the tongue daily.    dicyclomine (BENTYL) 10 MG capsule Take 1 capsule (10 mg total) by mouth 3 (three) times daily as needed (abdominal pain).    FARXIGA 10 MG TABS tablet Take 10 mg by mouth daily.    glucose blood (ACCU-CHEK GUIDE) test strip Use as instructed to check sugar daily for type 2 diabetes.    iron polysaccharides (FERREX 150) 150 MG capsule TAKE 1 CAPSULE BY MOUTH DAILY    isosorbide mononitrate (IMDUR) 120 MG 24 hr tablet TAKE 1 TABLET BY MOUTH DAILY 10/02/2023: Hold if SBP <100   lidocaine (LIDODERM) 5 % Place 1 patch onto the skin daily. Remove & Discard patch  within 12 hours or as directed by MD    metFORMIN (GLUCOPHAGE-XR) 500 MG 24 hr tablet Take 1 tablet (500 mg total) by mouth 2 (two) times daily. (Patient taking differently: Take 500 mg by mouth daily.) 10/01/2023: Reports taking one a day   methocarbamol (ROBAXIN) 500 MG tablet TAKE 1 TABLET BY MOUTH EVERY 8 HOURS AS NEEDED FOR MUSCLE SPASMS 08/26/2023: Up to 4x a day   nitroGLYCERIN (NITROSTAT) 0.4 MG SL tablet Place 1 tablet (0.4 mg total) under the tongue every 5 (five) minutes as needed for chest pain.    omega-3 acid ethyl esters (LOVAZA) 1 g capsule TAKE 4 CAPSULES (4 GRAMS TOTAL) BY MOUTHDAILY    oxyCODONE-acetaminophen (PERCOCET) 10-325 MG tablet TAKE 1 TABLET BY MOUTH EVERY 4 HOURS AS NEEDED FOR PAIN    OXYGEN Inhale 2 L into the lungs at bedtime as needed (for shortness of breath).    pantoprazole (PROTONIX) 40 MG tablet Take 1 tablet (40 mg total) by mouth 2 (two) times daily for 14 days.    ranolazine (RANEXA) 1000 MG SR tablet Take 1 tablet (1,000 mg total) by mouth 2 (two) times daily.    spironolactone (ALDACTONE) 25 MG tablet Take 1 tablet by mouth daily.    traZODone (DESYREL) 100 MG tablet Take 1 tablet (100 mg total) by mouth at bedtime.  venlafaxine XR (EFFEXOR-XR) 75 MG 24 hr capsule Take 75 mg by mouth daily with breakfast.    No facility-administered encounter medications on file as of 10/07/2023.    Interventions:   Goals Addressed             This Visit's Progress    Care Management       Current Barriers:  Care Management support and education needs related to HTN, CHF, CAD, COPD, and DM High Fall Risk Requires additional Care Giver support in the home  Planned Interventions: CAD, HTN, CHF Lawrence [name] was recently evaluated and admitted to [facility] for chest pain on 09/28/23. He was discharged on 09/30/23. Reviewed hospital discharge instructions and medications with Lawrence [name] and his grandson Lawrence [name]. He did not discontinue metoprolol, lisinopril and amlodipine  as ordered. Lawrence [name] agreed to remove these medications. Aware that Lawrence [name] should also be discontinued. Lawrence [name] reports that medication had already been removed from the pill bag. Lawrence [name] reported being very weak and dizzy during the outreach. Lawrence [name] confirmed that he has been extremely weak since returning home yesterday. He initially felt it was due to lack of rest but notes the symptoms have not resolved. He has not been able to get up today without assistance. Lawrence [name] also notes constant throbbing chest pain all day today. He has experienced a few episodes of nausea. He denies shortness of breath. Denies headaches or visual changes. Reports checking BP a few times today. One reading was normal. Notes a second reading with a diastolic of 146/102. Agreed to check BP during the call. Reports reading was 160/112. Discussed symptoms and concerns that he needs to be evaluated in the ER. He has a history of chronic intermittent chest pain, however he reports the symptoms have lasted all day. He is agreeable to this plan. 911 was called with patient on the line. RNCM remained on line until paramedics arrived. Per paramedic team, Lawrence [name] appeared very weak, his BP was still elevated. Agreed that he needed further evaluation in the ER. Lawrence [name] was transported to [facility] for further evaluation. Update 10/06/23: Brief outreach today. He was evaluated today at [facility] for chest pain. Denies concerns at time of call but prefers to rest d/t being up for several hours in the ED.  Reminded of pending Cardiology appointment with Lawrence [name] grandson Lawrence [name]. Per chart review he is scheduled for evaluation by Dr. [facility] on 10/07/23. Collaborated with clinic staff. Confirmed appointment was scheduled for the [place] location in [place].  Reviewed indications for seeking immediate medical attention with Lawrence [name] grandson Lawrence [name]. Outreach with RNCM scheduled for 10/07/23 at  4:30 Update 10/07/23: Lawrence [name] was doing well today. Lawrence [name] reports he has not complained of chest discomfort since the ER visit on yesterday. Denies falls or complaints of dizziness or lightheadedness.  Mr. Nealy did not attend the cardiology appointment in [facility] as scheduled. [facility] team will reschedule. Reviewed pending appointments. He is scheduled for outreach with his PCP on 10/11/23. Reviewed worsening s/sx and indications for seeking immediate medical attention.     Self-Care Activities/Patient Goals: Take all medications as prescribed Keep scheduled appointments Monitor BP and record readings Monitor weight and record readings Notify provider for weight gain outside of established parameters Limit sodium intake Follow recommended safety and fall prevention measures Monitor symptoms and seek immediate medical attention if symptoms worsen            PLAN Will follow up next week  Katina [facility]  [facility] RN Care Manager Direct Dial: [phone] Website: [email or web address]

## 2023-10-12 NOTE — Progress Notes (Signed)
  Care Coordination   Note   10/12/2023 Name: Lawrence [name] MRN: [id number] DOB: 04/01/1956  [name] is a 67 y.o. year old male who sees [name], Demetrios Isaacs, MD for primary care. I reached out to [name] by phone today to offer care coordination services.  Mr. Dornbusch was given information about Care Coordination services today including:   The Care Coordination services include support from the care team which includes your Nurse Coordinator, Clinical Social Worker, or Pharmacist.  The Care Coordination team is here to help remove barriers to the health concerns and goals most important to you. Care Coordination services are voluntary, and the patient may decline or stop services at any time by request to their care team member.   Care Coordination Consent Status: Patient agreed to services and verbal consent obtained.   Follow up plan:  Telephone appointment with care coordination team member scheduled for:  10/22/2023  Encounter Outcome:  Patient Scheduled from referral   Burman Nieves, [facility] Care Coordination Care Guide Direct Dial: [phone]

## 2023-10-13 ENCOUNTER — Ambulatory Visit: Payer: Medicare HMO | Admitting: Family Medicine

## 2023-10-13 ENCOUNTER — Encounter: Payer: Self-pay | Admitting: Family Medicine

## 2023-10-13 VITALS — BP 102/61 | HR 75 | Ht 67.0 in | Wt 186.8 lb

## 2023-10-13 DIAGNOSIS — R6889 Other general symptoms and signs: Secondary | ICD-10-CM | POA: Diagnosis not present

## 2023-10-13 DIAGNOSIS — G8929 Other chronic pain: Secondary | ICD-10-CM

## 2023-10-13 DIAGNOSIS — M25552 Pain in left hip: Secondary | ICD-10-CM | POA: Diagnosis not present

## 2023-10-13 DIAGNOSIS — T888XXA Other specified complications of surgical and medical care, not elsewhere classified, initial encounter: Secondary | ICD-10-CM | POA: Diagnosis not present

## 2023-10-13 DIAGNOSIS — G4733 Obstructive sleep apnea (adult) (pediatric): Secondary | ICD-10-CM

## 2023-10-13 NOTE — Assessment & Plan Note (Signed)
Pt's CPAP machine broken, had sleep study in 2016. Given the timeframe since his last assessment for CPAP machine, ordered placed for sleep study at home based on patients preference.   Will await results and CPAP machine recommendations for pt to placed order for new one.

## 2023-10-13 NOTE — Assessment & Plan Note (Signed)
L hip - chornic pain for 10 years per pt. He is happy with pain mgmt provided by Dr. Sherrie Mustache. He does not need any refills or new prescriptions at this time.   Pt interested in joint injections for hip for possible pain mgmt for hip pain. Will refer to orthopedics for pt.

## 2023-10-13 NOTE — Progress Notes (Addendum)
Acute Office Visit  Introduced to nurse practitioner role and practice setting.  All questions answered.  Discussed provider/patient relationship and expectations.   Subjective:     Patient ID: Lawrence [name], male    DOB: 11-19-1955, 67 y.o.   MRN: [id number]  Chief Complaint  Patient presents with   Hip Pain    Left hip discomfort X 5 months and gradually worsening. Patient takes oxycodone 10-325 and reports he just took one within the last 30 minutes.    Obstructive Sleep Apnea    Patient report he is needing a new machine. Last sleep study several years ago. Patient reports previously seeing Dr.Khan    HPI Hip Pain: Patient complains of left hip pain. Onset of the symptoms was several years ago. Inciting event: none. Current symptoms include is worse with weight bearing, is aggravated by walking, and radiates to buttock . Associated symptoms: none. Aggravating symptoms: going up and down stairs, rising after sitting, and walking. Patient's overall course: waxing and waning. Patient has had prior hip problems. Previous visits for this problem:  was seen in ED for hip pain on 09/07/23 . Evaluation to date: x-ray showed mild degenerative changes bilaterally on 09/24/23 & CT of L Hip "IMPRESSION:No acute bony or soft tissue findings to explain the patient's history of pain."   Treatment to date: prescription analgesics, which have been effective.   Pain in hip is helped with current regimen of Percocet 10-325, prescribed and managed by Dr. Sherrie Mustache. He has been on his medication for 10 years and happy with this medication for pain, which bring down pain to a 6/10.  Pt states walker assists with pain mgmt walking, physical therapy has not helped, He is interested in a referral to orthopedics for potential assessment for joint injections.   ROS    Objective:    BP 102/61 (BP Location: Left Arm, Cuff Size: Normal)   Pulse 75   Ht 5\' 7"  (1.702 m)   Wt 186 lb 12.8 oz (84.7 kg)   SpO2 96%    BMI 29.26 kg/m   BP initial assessment was 84/66; Recheck charted: 102/61- pt stated had taken percocet 30 minutes prior to visit and xanax an hour before visit. Denied dizziness, lightheadedness, or faintness. No chest pain or palpitations.  Physical Exam Constitutional:      Appearance: Normal appearance. He is normal weight.  HENT:     Head: Normocephalic.  Eyes:     Pupils: Pupils are equal, round, and reactive to light.  Cardiovascular:     Rate and Rhythm: Normal rate and regular rhythm.     Pulses: Normal pulses.  Pulmonary:     Effort: Pulmonary effort is normal.     Comments: diminished Musculoskeletal:     Cervical back: Normal range of motion.     Right hip: No tenderness.     Left hip: Tenderness present. Decreased range of motion.     Left upper leg: No swelling or tenderness.     Right lower leg: No swelling.     Left lower leg: No swelling.  Skin:    General: Skin is dry.     Comments: flaky  Neurological:     General: No focal deficit present.     Mental Status: He is alert. Mental status is at baseline.       10/13/2023    2:00 PM  Results of the Epworth flowsheet  Sitting and reading 3  Watching TV 3  Sitting, inactive in  a public place (e.g. a theatre or a meeting) 3  As a passenger in a car for an hour without a break 3  Lying down to rest in the afternoon when circumstances permit 3  Sitting and talking to someone 3  Sitting quietly after a lunch without alcohol 3  In a car, while stopped for a few minutes in traffic 3  Total score 24    No results found for any visits on 10/13/23.     Assessment & Plan:    Problem List Items Addressed This Visit       Respiratory   OSA (obstructive sleep apnea)    Pt's CPAP machine broken, had sleep study in 2016. Given the timeframe since his last assessment for CPAP machine, ordered placed for sleep study at home based on patients preference.   Will await results and CPAP machine recommendations  for pt to placed order for new one.       Relevant Orders   PSG Sleep Study     Other   Chronic hip pain, left - Primary    L hip - chornic pain for 10 years per pt. He is happy with pain mgmt provided by Dr. Sherrie Mustache. He does not need any refills or new prescriptions at this time.   Pt interested in joint injections for hip for possible pain mgmt for hip pain. Will refer to orthopedics for pt.       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      Blood pressure alteration    Initial BP check 84/66, pt asymptomatic. Recheck 102/61 - pt continued to be asymptomatic.  Instructed pt to space out timing of administration of his percocet and xanax medications to decrease risk of lowering his BP, which was seen during visit. Pt was asymptomatic otherwise and agreeable to spacing out medications 5-6 hours to reduce risk of lowered BP.        Return in about 3 months (around 01/13/2024) for chronic disease mgmt with Dr. Sherrie Mustache, PCP.  I, Sallee Provencal, FNP, have reviewed all documentation for this visit. The documentation on 10/13/23 for the exam, diagnosis, procedures, and orders are all accurate and complete.   Sallee Provencal, FNP

## 2023-10-13 NOTE — Assessment & Plan Note (Signed)
See OSA plan  - ordered new sleep study for new CPAP machine.

## 2023-10-13 NOTE — Assessment & Plan Note (Addendum)
Initial BP check 84/66, pt asymptomatic. Recheck 102/61 - pt continued to be asymptomatic.  Instructed pt to space out timing of administration of his percocet and xanax medications to decrease risk of lowering his BP, which was seen during visit. Pt was asymptomatic otherwise and agreeable to spacing out medications 5-6 hours to reduce risk of lowered BP.

## 2023-10-15 ENCOUNTER — Telehealth: Payer: Self-pay

## 2023-10-15 NOTE — Patient Outreach (Signed)
  Care Management   Outreach Note  10/15/2023 Name: Lawrence [name] MRN: [id number] DOB: 09-21-1956  An unsuccessful telephone outreach was attempted today to contact the patient about Care Management needs.     Follow Up Plan:  A member of the care team will attempt outreach again next week.   [facility]  [facility], [facility] RN Care Manager Direct Dial: [phone] Website: [email or web address]

## 2023-10-18 ENCOUNTER — Observation Stay: Payer: Medicare HMO

## 2023-10-18 ENCOUNTER — Other Ambulatory Visit: Payer: Self-pay

## 2023-10-18 ENCOUNTER — Observation Stay
Admission: EM | Admit: 2023-10-18 | Discharge: 2023-10-21 | Disposition: A | Discharge status: Home / Self Care | Payer: Medicare HMO | Attending: Internal Medicine | Admitting: Internal Medicine

## 2023-10-18 DIAGNOSIS — R531 Weakness: Secondary | ICD-10-CM | POA: Diagnosis not present

## 2023-10-18 DIAGNOSIS — R0602 Shortness of breath: Secondary | ICD-10-CM | POA: Diagnosis not present

## 2023-10-18 DIAGNOSIS — F418 Other specified anxiety disorders: Secondary | ICD-10-CM | POA: Diagnosis not present

## 2023-10-18 DIAGNOSIS — I5042 Chronic combined systolic (congestive) and diastolic (congestive) heart failure: Secondary | ICD-10-CM | POA: Diagnosis not present

## 2023-10-18 DIAGNOSIS — I2510 Atherosclerotic heart disease of native coronary artery without angina pectoris: Secondary | ICD-10-CM | POA: Diagnosis not present

## 2023-10-18 DIAGNOSIS — Z7984 Long term (current) use of oral hypoglycemic drugs: Secondary | ICD-10-CM | POA: Diagnosis not present

## 2023-10-18 DIAGNOSIS — E663 Overweight: Secondary | ICD-10-CM | POA: Diagnosis present

## 2023-10-18 DIAGNOSIS — G4733 Obstructive sleep apnea (adult) (pediatric): Secondary | ICD-10-CM

## 2023-10-18 DIAGNOSIS — Z87891 Personal history of nicotine dependence: Secondary | ICD-10-CM | POA: Diagnosis not present

## 2023-10-18 DIAGNOSIS — I110 Hypertensive heart disease with heart failure: Secondary | ICD-10-CM | POA: Diagnosis not present

## 2023-10-18 DIAGNOSIS — J449 Chronic obstructive pulmonary disease, unspecified: Secondary | ICD-10-CM | POA: Diagnosis present

## 2023-10-18 DIAGNOSIS — Z79899 Other long term (current) drug therapy: Secondary | ICD-10-CM | POA: Insufficient documentation

## 2023-10-18 DIAGNOSIS — K5730 Diverticulosis of large intestine without perforation or abscess without bleeding: Secondary | ICD-10-CM | POA: Insufficient documentation

## 2023-10-18 DIAGNOSIS — I482 Chronic atrial fibrillation, unspecified: Secondary | ICD-10-CM | POA: Diagnosis present

## 2023-10-18 DIAGNOSIS — I5022 Chronic systolic (congestive) heart failure: Secondary | ICD-10-CM | POA: Diagnosis present

## 2023-10-18 DIAGNOSIS — Z6825 Body mass index (BMI) 25.0-25.9, adult: Secondary | ICD-10-CM | POA: Diagnosis not present

## 2023-10-18 DIAGNOSIS — I700 Atherosclerosis of aorta: Secondary | ICD-10-CM | POA: Diagnosis not present

## 2023-10-18 DIAGNOSIS — I959 Hypotension, unspecified: Principal | ICD-10-CM

## 2023-10-18 DIAGNOSIS — I10 Essential (primary) hypertension: Secondary | ICD-10-CM | POA: Diagnosis not present

## 2023-10-18 DIAGNOSIS — R0689 Other abnormalities of breathing: Secondary | ICD-10-CM | POA: Diagnosis not present

## 2023-10-18 DIAGNOSIS — J9611 Chronic respiratory failure with hypoxia: Secondary | ICD-10-CM | POA: Diagnosis present

## 2023-10-18 DIAGNOSIS — R079 Chest pain, unspecified: Secondary | ICD-10-CM | POA: Diagnosis not present

## 2023-10-18 DIAGNOSIS — E785 Hyperlipidemia, unspecified: Secondary | ICD-10-CM | POA: Diagnosis not present

## 2023-10-18 DIAGNOSIS — M25552 Pain in left hip: Secondary | ICD-10-CM | POA: Diagnosis present

## 2023-10-18 DIAGNOSIS — E119 Type 2 diabetes mellitus without complications: Secondary | ICD-10-CM

## 2023-10-18 DIAGNOSIS — J441 Chronic obstructive pulmonary disease with (acute) exacerbation: Secondary | ICD-10-CM | POA: Diagnosis present

## 2023-10-18 DIAGNOSIS — Z8659 Personal history of other mental and behavioral disorders: Secondary | ICD-10-CM | POA: Insufficient documentation

## 2023-10-18 DIAGNOSIS — I4891 Unspecified atrial fibrillation: Secondary | ICD-10-CM | POA: Diagnosis not present

## 2023-10-18 DIAGNOSIS — E782 Mixed hyperlipidemia: Secondary | ICD-10-CM | POA: Diagnosis present

## 2023-10-18 LAB — URINALYSIS, ROUTINE W REFLEX MICROSCOPIC
Bilirubin Urine: NEGATIVE
Glucose, UA: >=500 mg/dL — AB
Hgb urine dipstick: NEGATIVE
Ketones, ur: NEGATIVE mg/dL
Leukocytes,Ua: NEGATIVE
Nitrite: NEGATIVE
Protein, ur: NEGATIVE mg/dL
Specific Gravity, Urine: 1.028 (ref 1.005–1.030)
Squamous Epithelial / HPF: 0 /[HPF] (ref 0–5)
pH: 5 (ref 5.0–8.0)

## 2023-10-18 LAB — TROPONIN I (HIGH SENSITIVITY)
Troponin I (High Sensitivity): 10 ng/L (ref <18)
Troponin I (High Sensitivity): 8 ng/L (ref <18)

## 2023-10-18 LAB — COMPREHENSIVE METABOLIC PANEL
ALT: 14 U/L (ref 0–44)
AST: 18 U/L (ref 15–41)
Albumin: 3.1 g/dL — ABNORMAL LOW (ref 3.5–5.0)
Alkaline Phosphatase: 44 U/L (ref 38–126)
Anion gap: 6 (ref 5–15)
BUN: 12 mg/dL (ref 8–23)
CO2: 29 mmol/L (ref 22–32)
Calcium: 8.6 mg/dL — ABNORMAL LOW (ref 8.9–10.3)
Chloride: 105 mmol/L (ref 98–111)
Creatinine, Ser: 1.07 mg/dL (ref 0.61–1.24)
GFR, Estimated: >60 mL/min (ref >60)
Glucose, Bld: 133 mg/dL — ABNORMAL HIGH (ref 70–99)
Potassium: 4.6 mmol/L (ref 3.5–5.1)
Sodium: 140 mmol/L (ref 135–145)
Total Bilirubin: 1.1 mg/dL (ref <1.2)
Total Protein: 5.5 g/dL — ABNORMAL LOW (ref 6.5–8.1)

## 2023-10-18 LAB — CBC WITH DIFFERENTIAL/PLATELET
Abs Immature Granulocytes: 0.04 10*3/uL (ref 0.00–0.07)
Basophils Absolute: 0 10*3/uL (ref 0.0–0.1)
Basophils Relative: 1 %
Eosinophils Absolute: 0.2 10*3/uL (ref 0.0–0.5)
Eosinophils Relative: 2 %
HCT: 50.6 % (ref 39.0–52.0)
Hemoglobin: 16.1 g/dL (ref 13.0–17.0)
Immature Granulocytes: 1 %
Lymphocytes Relative: 15 %
Lymphs Abs: 1.3 10*3/uL (ref 0.7–4.0)
MCH: 29.7 pg (ref 26.0–34.0)
MCHC: 31.8 g/dL (ref 30.0–36.0)
MCV: 93.2 fL (ref 80.0–100.0)
Monocytes Absolute: 0.4 10*3/uL (ref 0.1–1.0)
Monocytes Relative: 5 %
Neutro Abs: 6.6 10*3/uL (ref 1.7–7.7)
Neutrophils Relative %: 76 %
Platelets: 151 10*3/uL (ref 150–400)
RBC: 5.43 MIL/uL (ref 4.22–5.81)
RDW: 14.9 % (ref 11.5–15.5)
WBC: 8.6 10*3/uL (ref 4.0–10.5)
nRBC: 0 % (ref 0.0–0.2)

## 2023-10-18 LAB — URINE DRUG SCREEN, QUALITATIVE (ARMC ONLY)
Amphetamines, Ur Screen: NOT DETECTED
Barbiturates, Ur Screen: NOT DETECTED
Benzodiazepine, Ur Scrn: POSITIVE — AB
Cannabinoid 50 Ng, Ur ~~LOC~~: POSITIVE — AB
Cocaine Metabolite,Ur ~~LOC~~: NOT DETECTED
MDMA (Ecstasy)Ur Screen: NOT DETECTED
Methadone Scn, Ur: NOT DETECTED
Opiate, Ur Screen: POSITIVE — AB
Phencyclidine (PCP) Ur S: NOT DETECTED
Tricyclic, Ur Screen: NOT DETECTED

## 2023-10-18 LAB — RESP PANEL BY RT-PCR (RSV, FLU A&B, COVID)  RVPGX2
Influenza A by PCR: NEGATIVE
Influenza B by PCR: NEGATIVE
Resp Syncytial Virus by PCR: NEGATIVE
SARS Coronavirus 2 by RT PCR: NEGATIVE

## 2023-10-18 LAB — CBG MONITORING, ED: Glucose-Capillary: 92 mg/dL (ref 70–99)

## 2023-10-18 LAB — BRAIN NATRIURETIC PEPTIDE: B Natriuretic Peptide: 196.3 pg/mL — ABNORMAL HIGH (ref 0.0–100.0)

## 2023-10-18 MED ORDER — MIDODRINE HCL 5 MG PO TABS: 10.0000 mg | ORAL_TABLET | Freq: Three times a day (TID) | ORAL | Status: DC

## 2023-10-18 MED ORDER — ALPRAZOLAM 0.5 MG PO TABS
1.0000 mg | ORAL_TABLET | Freq: Three times a day (TID) | ORAL | Status: DC | PRN
  Administered 2023-10-19 – 2023-10-21 (×2): 1 mg via ORAL
  Filled 2023-10-18 (×2): qty 2

## 2023-10-18 MED ORDER — SODIUM CHLORIDE 0.9 % IV SOLN: INTRAVENOUS | Status: AC

## 2023-10-18 MED ORDER — INSULIN ASPART 100 UNIT/ML IJ SOLN
0.0000 [IU] | Freq: Three times a day (TID) | INTRAMUSCULAR | Status: DC
  Administered 2023-10-20: 1 [IU] via SUBCUTANEOUS
  Administered 2023-10-21: 2 [IU] via SUBCUTANEOUS
  Filled 2023-10-18 (×2): qty 1

## 2023-10-18 MED ORDER — FENTANYL CITRATE PF 50 MCG/ML IJ SOSY
25.0000 ug | PREFILLED_SYRINGE | Freq: Once | INTRAMUSCULAR | Status: AC
  Administered 2023-10-18: 25 ug via INTRAVENOUS
  Filled 2023-10-18: qty 1

## 2023-10-18 MED ORDER — IPRATROPIUM BROMIDE 0.02 % IN SOLN: 0.5000 mg | RESPIRATORY_TRACT | Status: DC | PRN

## 2023-10-18 MED ORDER — APIXABAN 5 MG PO TABS
5.0000 mg | ORAL_TABLET | Freq: Two times a day (BID) | ORAL | Status: DC
  Administered 2023-10-18 – 2023-10-21 (×6): 5 mg via ORAL
  Filled 2023-10-18 (×6): qty 1

## 2023-10-18 MED ORDER — INSULIN ASPART 100 UNIT/ML IJ SOLN: 0.0000 [IU] | Freq: Every day | INTRAMUSCULAR | Status: DC

## 2023-10-18 MED ORDER — ONDANSETRON 4 MG PO TBDP
4.0000 mg | ORAL_TABLET | Freq: Once | ORAL | Status: AC
  Administered 2023-10-18: 4 mg via ORAL
  Filled 2023-10-18: qty 1

## 2023-10-18 MED ORDER — SODIUM CHLORIDE 0.9 % IV SOLN: INTRAVENOUS | Status: DC

## 2023-10-18 MED ORDER — OXYCODONE-ACETAMINOPHEN 5-325 MG PO TABS
1.0000 | ORAL_TABLET | ORAL | Status: DC | PRN
  Administered 2023-10-19 – 2023-10-21 (×10): 1 via ORAL
  Filled 2023-10-18 (×10): qty 1

## 2023-10-18 MED ORDER — ACETAMINOPHEN 325 MG PO TABS
650.0000 mg | ORAL_TABLET | Freq: Four times a day (QID) | ORAL | Status: DC | PRN
  Administered 2023-10-20 – 2023-10-21 (×2): 650 mg via ORAL
  Filled 2023-10-18 (×2): qty 2

## 2023-10-18 MED ORDER — SODIUM CHLORIDE 0.9 % IV BOLUS
500.0000 mL | Freq: Once | INTRAVENOUS | Status: AC
  Administered 2023-10-18: 500 mL via INTRAVENOUS

## 2023-10-18 MED ORDER — ONDANSETRON HCL 4 MG/2ML IJ SOLN: 4.0000 mg | Freq: Three times a day (TID) | INTRAMUSCULAR | Status: DC | PRN

## 2023-10-18 MED ORDER — ENOXAPARIN SODIUM 40 MG/0.4ML IJ SOSY: 40.0000 mg | PREFILLED_SYRINGE | INTRAMUSCULAR | Status: DC

## 2023-10-18 MED ORDER — DM-GUAIFENESIN ER 30-600 MG PO TB12
1.0000 | ORAL_TABLET | Freq: Two times a day (BID) | ORAL | Status: DC | PRN
  Administered 2023-10-20: 1 via ORAL
  Filled 2023-10-18 (×2): qty 1

## 2023-10-18 MED ORDER — TRAZODONE HCL 100 MG PO TABS
100.0000 mg | ORAL_TABLET | Freq: Every day | ORAL | Status: DC
  Administered 2023-10-18 – 2023-10-20 (×3): 100 mg via ORAL
  Filled 2023-10-18 (×3): qty 1

## 2023-10-18 MED ORDER — ACETAMINOPHEN 500 MG PO TABS
1000.0000 mg | ORAL_TABLET | Freq: Once | ORAL | Status: AC
  Administered 2023-10-18: 1000 mg via ORAL
  Filled 2023-10-18: qty 2

## 2023-10-18 NOTE — ED Notes (Signed)
Pt given warm blanket by this RN. NAD, pt does not express any further needs. Call light within reach.

## 2023-10-18 NOTE — ED Triage Notes (Signed)
Pt bib [facility] from home cx of increased drowsiness. Increased drowsiness started last night during dinner when pt was "dozing off" and had to be physically stimulated to arouse by family per ems. Pt has hx afib, cardiac stents and heart attack. Pt is on plavix  140 cbg 113/76  67-100 hr 97% on 1 L

## 2023-10-18 NOTE — H&P (Signed)
History and Physical    Lawrence [name] NFA:[id number] DOB: 1956-01-10 DOA: 10/18/2023  Referring MD/NP/PA:   PCP: Malva Limes, MD   Patient coming from:  The patient is coming from home.     Chief Complaint: Weakness, chest pain  HPI: Lawrence [name] is a 67 y.o. male with medical history significant of CAD (s/p of stent x 4 and CBAG 2016), sCHF with EF 30-35%, a fib on Eliquis, PSVT,  HTN, HLD, DM, COPD on 2L O2, OSA not using CPAP, dCHF, anxiety, celiac disease, pancreatitis, who presents with weakness, chest pain.  Patient states that he has not been feeling well in the past several days, with fatigue and generalized weakness. He reports chest pain which is a chronic issue.  Patient has chronic chest pain, which is located in the substernal area, intermittent, pressure-like, nonradiating, 8 out of 10 in severity.  Not aggravated or alleviated by any known factors.  Patient has chronic dry cough, and mild shortness of breath due to COPD, which has not changed.  No fever or chills.  Patient has nausea, no vomiting, diarrhea or abdominal pain.  He has poor appetite and decreased oral intake.  No symptoms of UTI.  He has chronic left hip pain and back pain.  No recent fall or injury. Pt is somnolent during the interview, but easily arousable, orientated x 3.  He moves all extremities normally.  No facial droop or slurred speech.  Patient was found to have hypotension with blood pressure 88/61, which improved to 91/68 after giving 500 cc normal saline bolus in ED.  Data reviewed independently and ED Course: pt was found to have troponin 10, WBC 8.6, GFR> 60, negative PCR for COVID, BMP 196.3, temperature normal, heart rate 82, RR 17, oxygen sat 97% on room air. Pt is placed in tele bed for obs.  EKG: I have personally reviewed.  A-fib, QTc 467, poor R wave progression   Review of Systems:   General: no fevers, chills, no body weight gain, has poor appetite, has fatigue HEENT: no blurry  vision, hearing changes or sore throat Respiratory: has dyspnea, coughing, no wheezing CV: has chest pain, no palpitations GI: has nausea, no vomiting, abdominal pain, diarrhea, constipation GU: no dysuria, burning on urination, increased urinary frequency, hematuria  Ext: no leg edema Neuro: no unilateral weakness, numbness, or tingling, no vision change or hearing loss Skin: no rash, no skin tear. MSK: No muscle spasm, no deformity, no limitation of range of movement in spin Heme: No easy bruising.  Travel history: No recent long distant travel.   Allergy:  Allergies  Allergen Reactions   Prednisone Other (See Comments) and Hypertension    Pt states that this medication puts him in A-fib    Demerol  [Meperidine Hcl]    Demerol [Meperidine] Hives   Sulfa Antibiotics Hives   Albuterol Sulfate [Albuterol] Palpitations and Other (See Comments)    Pt currently uses this medication.     Empagliflozin Other (See Comments) and Hives    Perineal pain  Other Reaction(s): Other (See Comments)   Morphine Sulfate Nausea And Vomiting, Rash and Other (See Comments)    Pt states that he is only allergic to the tablet form of this medication.      Past Medical History:  Diagnosis Date   A-fib (HCC)    Anemia    Anginal pain (HCC)    Anxiety    Arthritis    Asthma    CAD (coronary artery  disease)    a. 2002 CABGx2 (LIMA->LAD, VG->VG->OM1);  b. 09/2012 DES->OM;  c. 03/2015 PTCA of LAD [facility]) in setting of atretic LIMA; d. 05/2015 Cath [facility]): nonobs dzs; e. 06/2015 Cath ([facility]): LM nl, LAD 45p/d ISR, 50d, D1/2 small, LCX 50p/d ISR, OM1 70ost, 30 ISR, VG->OM1 50ost, 27m, LIMA->LAD 99p/d - atretic, RCA dom, nl; f.cath 10/16: 40-50%(FFR 0.90) pLAD, 75% (FFR 0.77) mLAD s/p PCI/DES, oRCA 40% (FFR0.95)   Cancer (HCC)    SKIN CANCER ON BACK   Celiac disease    Chronic diastolic CHF (congestive heart failure) (HCC)    a. 06/2009 Echo: EF 60-65%, Gr 1 DD, triv AI, mildly dil LA, nl RV.   COPD (chronic  obstructive pulmonary disease) (HCC)    a. Chronic bronchitis and emphysema.   DDD (degenerative disc disease), lumbar    Diverticulosis    Dysrhythmia    Essential hypertension    GERD (gastroesophageal reflux disease)    History of hiatal hernia    History of kidney stones    H/O   History of tobacco abuse    a. Quit 2014.   Myocardial infarction [facility]) 2002   4 STENTS   Pancreatitis    PSVT (paroxysmal supraventricular tachycardia) (HCC)    a. 10/2012 Noted on Zio Patch.   Sleep apnea    LOST CORD TO CPAP -ONLY 02 @ BEDTIME   Tubular adenoma of colon    Type II diabetes mellitus (HCC)     Past Surgical History:  Procedure Laterality Date   BYPASS GRAFT     CARDIAC CATHETERIZATION N/A 07/12/2015   rocedure: Left Heart Cath and Cors/Grafts Angiography;  Surgeon: Lyn Records, MD;  Location: [facility] INVASIVE CV LAB;  Service: Cardiovascular;  Laterality: N/A;   CARDIAC CATHETERIZATION Right 10/07/2015   Procedure: Left Heart Cath and Cors/Grafts Angiography;  Surgeon: Laurier Nancy, MD;  Location: [facility] INVASIVE CV LAB;  Service: Cardiovascular;  Laterality: Right;   CARDIAC CATHETERIZATION N/A 04/06/2016   Procedure: Left Heart Cath and Coronary Angiography;  Surgeon: Alwyn Pea, MD;  Location: [facility] INVASIVE CV LAB;  Service: Cardiovascular;  Laterality: N/A;   CARDIAC CATHETERIZATION  04/06/2016   Procedure: Bypass Graft Angiography;  Surgeon: Alwyn Pea, MD;  Location: [facility] INVASIVE CV LAB;  Service: Cardiovascular;;   CARDIAC CATHETERIZATION N/A 11/02/2016   Procedure: Left Heart Cath and Cors/Grafts Angiography and possible PCI;  Surgeon: Alwyn Pea, MD;  Location: [facility] INVASIVE CV LAB;  Service: Cardiovascular;  Laterality: N/A;   CARDIAC CATHETERIZATION N/A 11/02/2016   Procedure: Coronary Stent Intervention;  Surgeon: Alwyn Pea, MD;  Location: [facility] INVASIVE CV LAB;  Service: Cardiovascular;  Laterality: N/A;   CARDIOVERSION N/A 06/29/2022    Procedure: CARDIOVERSION;  Surgeon: Lamar Blinks, MD;  Location: [facility] ORS;  Service: Cardiovascular;  Laterality: N/A;   CHOLECYSTECTOMY     CIRCUMCISION N/A 06/09/2019   Procedure: CIRCUMCISION ADULT;  Surgeon: Sondra Come, MD;  Location: [facility] ORS;  Service: Urology;  Laterality: N/A;   COLONOSCOPY WITH PROPOFOL N/A 04/01/2018   Procedure: COLONOSCOPY WITH PROPOFOL;  Surgeon: Scot Jun, MD;  Location: [facility] ENDOSCOPY;  Service: Endoscopy;  Laterality: N/A;   COLONOSCOPY WITH PROPOFOL N/A 05/01/2023   Procedure: COLONOSCOPY WITH PROPOFOL;  Surgeon: Toney Reil, MD;  Location: [facility] ENDOSCOPY;  Service: Gastroenterology;  Laterality: N/A;   ESOPHAGEAL DILATION     ESOPHAGOGASTRODUODENOSCOPY (EGD) WITH PROPOFOL N/A 04/01/2018   Procedure: ESOPHAGOGASTRODUODENOSCOPY (EGD) WITH PROPOFOL;  Surgeon: Scot Jun,  MD;  Location: [facility] ENDOSCOPY;  Service: Endoscopy;  Laterality: N/A;   ESOPHAGOGASTRODUODENOSCOPY (EGD) WITH PROPOFOL N/A 05/01/2023   Procedure: ESOPHAGOGASTRODUODENOSCOPY (EGD) WITH PROPOFOL;  Surgeon: Toney Reil, MD;  Location: [facility] ENDOSCOPY;  Service: Gastroenterology;  Laterality: N/A;   GIVENS CAPSULE STUDY  05/01/2023   Procedure: GIVENS CAPSULE STUDY;  Surgeon: Toney Reil, MD;  Location: [facility] ENDOSCOPY;  Service: Gastroenterology;;   LEFT HEART CATH AND CORS/GRAFTS ANGIOGRAPHY N/A 06/12/2019   Procedure: LEFT HEART CATH AND CORS/GRAFTS ANGIOGRAPHY;  Surgeon: Dalia Heading, MD;  Location: [facility] INVASIVE CV LAB;  Service: Cardiovascular;  Laterality: N/A;   LEFT HEART CATH AND CORS/GRAFTS ANGIOGRAPHY N/A 03/11/2020   Procedure: LEFT HEART CATH AND CORS/GRAFTS ANGIOGRAPHY;  Surgeon: Marcina Millard, MD;  Location: [facility] INVASIVE CV LAB;  Service: Cardiovascular;  Laterality: N/A;   LEFT HEART CATH AND CORS/GRAFTS ANGIOGRAPHY N/A 05/01/2021   Procedure: LEFT HEART CATH AND CORS/GRAFTS ANGIOGRAPHY;  Surgeon: Lamar Blinks, MD;  Location: [facility]  INVASIVE CV LAB;  Service: Cardiovascular;  Laterality: N/A;   LEFT HEART CATH AND CORS/GRAFTS ANGIOGRAPHY N/A 11/02/2022   Procedure: LEFT HEART CATH AND CORS/GRAFTS ANGIOGRAPHY;  Surgeon: Alwyn Pea, MD;  Location: [facility] INVASIVE CV LAB;  Service: Cardiovascular;  Laterality: N/A;   TONSILLECTOMY     VASCULAR SURGERY      Social History:  reports that he quit smoking about 10 years ago. His smoking use included cigarettes. He started smoking about 60 years ago. He has a 150 pack-year smoking history. He has never used smokeless tobacco. He reports that he does not currently use drugs after having used the following drugs: Marijuana. He reports that he does not drink alcohol.  Family History:  Family History  Problem Relation Age of Onset   Heart attack Mother    Depression Mother    Heart disease Mother    COPD Mother    Hypertension Mother    Heart attack Father    Diabetes Father    Depression Father    Heart disease Father    Cirrhosis Father    Parkinson's disease Brother      Prior to Admission medications   Medication Sig Start Date End Date Taking? Authorizing Provider  Accu-Chek Softclix Lancets lancets Use as instructed to check sugar daily for type 2 diabetes. 03/03/21   Malva Limes, MD  ALPRAZolam Prudy Feeler) 1 MG tablet Take 1 tablet (1 mg total) by mouth 3 (three) times daily as needed. 09/13/23   Malva Limes, MD  apixaban (ELIQUIS) 5 MG TABS tablet Take 1 tablet (5 mg total) by mouth 2 (two) times daily. 10/04/23   Malva Limes, MD  atorvastatin (LIPITOR) 80 MG tablet TAKE 1 TABLET BY MOUTH AT BEDTIME 07/15/23   Malva Limes, MD  Blood Glucose Calibration (ACCU-CHEK GUIDE CONTROL) LIQD Use with blood glucose monitor as directed 02/20/21   Malva Limes, MD  Blood Glucose Monitoring Suppl (ACCU-CHEK GUIDE) w/Device KIT Use to check blood sugars as directed 02/20/21   Malva Limes, MD  Budeson-Glycopyrrol-Formoterol (BREZTRI AEROSPHERE) 160-9-4.8  MCG/ACT AERO Inhale 2 puffs into the lungs 2 (two) times daily. 06/02/23   Malva Limes, MD  carvedilol (COREG) 3.125 MG tablet Take 3.125 mg by mouth 2 (two) times daily with a meal.    Lovenia Shuck, MD  cetirizine (ZYRTEC) 10 MG tablet TAKE 1 TABLET BY MOUTH AT BEDTIME 03/12/23   Malva Limes, MD  Cyanocobalamin (VITAMIN B-12) 1000 MCG SUBL  Place 1 tablet under the tongue daily.    [provider]  dicyclomine (BENTYL) 10 MG capsule Take 1 capsule (10 mg total) by mouth 3 (three) times daily as needed (abdominal pain). 05/14/23   Phineas Semen, MD  FARXIGA 10 MG TABS tablet Take 10 mg by mouth daily. 05/25/23   [provider]  glucose blood (ACCU-CHEK GUIDE) test strip Use as instructed to check sugar daily for type 2 diabetes. 03/03/21   Malva Limes, MD  iron polysaccharides (FERREX 150) 150 MG capsule TAKE 1 CAPSULE BY MOUTH DAILY 09/15/23   Malva Limes, MD  isosorbide mononitrate (IMDUR) 120 MG 24 hr tablet TAKE 1 TABLET BY MOUTH DAILY 08/16/23   Malva Limes, MD  lidocaine (LIDODERM) 5 % Place 1 patch onto the skin daily. Remove & Discard patch within 12 hours or as directed by MD 08/09/23   Arnetha Courser, MD  metFORMIN (GLUCOPHAGE-XR) 500 MG 24 hr tablet Take 1 tablet (500 mg total) by mouth 2 (two) times daily. Patient taking differently: Take 500 mg by mouth daily. 07/21/23   Jacky Kindle, FNP  methocarbamol (ROBAXIN) 500 MG tablet TAKE 1 TABLET BY MOUTH EVERY 8 HOURS AS NEEDED FOR MUSCLE SPASMS 07/15/23   Malva Limes, MD  nitroGLYCERIN (NITROSTAT) 0.4 MG SL tablet Place 1 tablet (0.4 mg total) under the tongue every 5 (five) minutes as needed for chest pain. 09/03/23   Malva Limes, MD  omega-3 acid ethyl esters (LOVAZA) 1 g capsule TAKE 4 CAPSULES (4 GRAMS TOTAL) BY MOUTHDAILY 07/09/23   Malva Limes, MD  oxyCODONE-acetaminophen (PERCOCET) 10-325 MG tablet TAKE 1 TABLET BY MOUTH EVERY 4 HOURS AS NEEDED FOR PAIN. 10/12/23   Malva Limes, MD   OXYGEN Inhale 2 L into the lungs at bedtime as needed (for shortness of breath).    [provider]  pantoprazole (PROTONIX) 40 MG tablet Take 1 tablet (40 mg total) by mouth 2 (two) times daily for 14 days. 06/08/23 08/07/23  Celso Amy, PA-C  ranolazine (RANEXA) 1000 MG SR tablet Take 1 tablet (1,000 mg total) by mouth 2 (two) times daily. 08/05/23   Marcelino Duster, MD  spironolactone (ALDACTONE) 25 MG tablet Take 1 tablet by mouth daily. 08/19/23 11/17/23  [provider]  traZODone (DESYREL) 100 MG tablet Take 1 tablet (100 mg total) by mouth at bedtime. 09/30/23   Enedina Finner, MD  venlafaxine XR (EFFEXOR-XR) 75 MG 24 hr capsule Take 75 mg by mouth daily with breakfast.    [provider]    Physical Exam: Vitals:   10/18/23 1900 10/18/23 1930 10/18/23 2156 10/18/23 2230  BP: (!) 88/61 90/64  97/65  Pulse: 82 77  70  Resp: 10 16  11   Temp:   98.4 F (36.9 C)   TempSrc:   Oral   SpO2: 97% 91%  97%  Weight:      Height:       General: Not in acute distress.  Dry mucous membrane HEENT:       Eyes: PERRL, EOMI, no jaundice       ENT: No discharge from the ears and nose, no pharynx injection, no tonsillar enlargement.        Neck: No JVD, no bruit, no mass felt. Heme: No neck lymph node enlargement. Cardiac: S1/S2, irregularly irregular rhythm, No murmurs, No gallops or rubs. Respiratory: No rales, wheezing, rhonchi or rubs. GI: Soft, nondistended, nontender, no rebound pain, no organomegaly, BS present. GU:  No hematuria Ext: No pitting leg edema bilaterally. 1+DP/PT pulse bilaterally. Musculoskeletal: No joint deformities, No joint redness or warmth, no limitation of ROM in spin. Skin: No rashes.  Neuro: Alert, oriented X3, cranial nerves II-XII grossly intact, moves all extremities normally. Psych: Patient is not psychotic, no suicidal or hemocidal ideation.  Labs on Admission: I have personally reviewed following labs and imaging  studies  CBC: Recent Labs  Lab 10/18/23 1549  WBC 8.6  NEUTROABS 6.6  HGB 16.1  HCT 50.6  MCV 93.2  PLT 151   Basic Metabolic Panel: Recent Labs  Lab 10/18/23 1549  NA 140  K 4.6  CL 105  CO2 29  GLUCOSE 133*  BUN 12  CREATININE 1.07  CALCIUM 8.6*   GFR: Estimated Creatinine Clearance: 67.4 mL/min (by C-G formula based on SCr of 1.07 mg/dL). Liver Function Tests: Recent Labs  Lab 10/18/23 1549  AST 18  ALT 14  ALKPHOS 44  BILITOT 1.1  PROT 5.5*  ALBUMIN 3.1*   No results for input(s): "LIPASE", "AMYLASE" in the last 168 hours. No results for input(s): "AMMONIA" in the last 168 hours. Coagulation Profile: No results for input(s): "INR", "PROTIME" in the last 168 hours. Cardiac Enzymes: No results for input(s): "CKTOTAL", "CKMB", "CKMBINDEX", "TROPONINI" in the last 168 hours. BNP (last 3 results) No results for input(s): "PROBNP" in the last 8760 hours. HbA1C: No results for input(s): "HGBA1C" in the last 72 hours. CBG: Recent Labs  Lab 10/18/23 2134  GLUCAP 92   Lipid Profile: No results for input(s): "CHOL", "HDL", "LDLCALC", "TRIG", "CHOLHDL", "LDLDIRECT" in the last 72 hours. Thyroid Function Tests: No results for input(s): "TSH", "T4TOTAL", "FREET4", "T3FREE", "THYROIDAB" in the last 72 hours. Anemia Panel: No results for input(s): "VITAMINB12", "FOLATE", "FERRITIN", "TIBC", "IRON", "RETICCTPCT" in the last 72 hours. Urine analysis:    Component Value Date/Time   COLORURINE YELLOW (A) 10/18/2023 1814   APPEARANCEUR CLEAR (A) 10/18/2023 1814   APPEARANCEUR Clear 05/25/2019 1554   LABSPEC 1.028 10/18/2023 1814   LABSPEC 1.012 03/07/2015 2143   PHURINE 5.0 10/18/2023 1814   GLUCOSEU >=500 (A) 10/18/2023 1814   GLUCOSEU Negative 03/07/2015 2143   HGBUR NEGATIVE 10/18/2023 1814   BILIRUBINUR NEGATIVE 10/18/2023 1814   BILIRUBINUR Negative 05/25/2019 1554   BILIRUBINUR Negative 03/07/2015 2143   KETONESUR NEGATIVE 10/18/2023 1814   PROTEINUR  NEGATIVE 10/18/2023 1814   UROBILINOGEN 0.2 03/26/2017 0907   NITRITE NEGATIVE 10/18/2023 1814   LEUKOCYTESUR NEGATIVE 10/18/2023 1814   LEUKOCYTESUR Negative 03/07/2015 2143   Sepsis Labs: @LABRCNTIP (procalcitonin:4,lacticidven:4) ) Recent Results (from the past 240 hour(s))  Resp panel by RT-PCR (RSV, Flu A&B, Covid) Anterior Nasal Swab     Status: None   Collection Time: 10/18/23  3:46 PM   Specimen: Anterior Nasal Swab  Result Value Ref Range Status   SARS Coronavirus 2 by RT PCR NEGATIVE NEGATIVE Final    Comment: (NOTE) SARS-CoV-2 target nucleic acids are NOT DETECTED.  The SARS-CoV-2 RNA is generally detectable in upper respiratory specimens during the acute phase of infection. The lowest concentration of SARS-CoV-2 viral copies this assay can detect is 138 copies/mL. A negative result does not preclude SARS-Cov-2 infection and should not be used as the sole basis for treatment or other patient management decisions. A negative result may occur with  improper specimen collection/handling, submission of specimen other than nasopharyngeal swab, presence of viral mutation(s) within the areas targeted by this assay, and inadequate number of viral copies(<138 copies/mL). A negative result must be  combined with clinical observations, patient history, and epidemiological information. The expected result is Negative.  Fact Sheet for Patients:  BloggerCourse.com  Fact Sheet for Healthcare Providers:  SeriousBroker.it  This test is no t yet approved or cleared by the [place] FDA and  has been authorized for detection and/or diagnosis of SARS-CoV-2 by FDA under an Emergency Use Authorization (EUA). This EUA will remain  in effect (meaning this test can be used) for the duration of the COVID-19 declaration under Section 564(b)(1) of the Act, 21 U.S.C.section 360bbb-3(b)(1), unless the authorization is terminated  or revoked  sooner.       Influenza A by PCR NEGATIVE NEGATIVE Final   Influenza B by PCR NEGATIVE NEGATIVE Final    Comment: (NOTE) The Xpert Xpress SARS-CoV-2/FLU/RSV plus assay is intended as an aid in the diagnosis of influenza from Nasopharyngeal swab specimens and should not be used as a sole basis for treatment. Nasal washings and aspirates are unacceptable for Xpert Xpress SARS-CoV-2/FLU/RSV testing.  Fact Sheet for Patients: BloggerCourse.com  Fact Sheet for Healthcare Providers: SeriousBroker.it  This test is not yet approved or cleared by the [place] FDA and has been authorized for detection and/or diagnosis of SARS-CoV-2 by FDA under an Emergency Use Authorization (EUA). This EUA will remain in effect (meaning this test can be used) for the duration of the COVID-19 declaration under Section 564(b)(1) of the Act, 21 U.S.C. section 360bbb-3(b)(1), unless the authorization is terminated or revoked.     Resp Syncytial Virus by PCR NEGATIVE NEGATIVE Final    Comment: (NOTE) Fact Sheet for Patients: BloggerCourse.com  Fact Sheet for Healthcare Providers: SeriousBroker.it  This test is not yet approved or cleared by the [place] FDA and has been authorized for detection and/or diagnosis of SARS-CoV-2 by FDA under an Emergency Use Authorization (EUA). This EUA will remain in effect (meaning this test can be used) for the duration of the COVID-19 declaration under Section 564(b)(1) of the Act, 21 U.S.C. section 360bbb-3(b)(1), unless the authorization is terminated or revoked.  Performed at [facility], [address] [place], [place] [address]      Radiological Exams on Admission: DG Chest [facility] 1 View  Result Date: 10/18/2023 CLINICAL DATA:  Chest pain. EXAM: PORTABLE CHEST 1 VIEW COMPARISON:  Chest radiograph dated 10/01/2023. FINDINGS: No focal  consolidation, pleural effusion, pneumothorax. Stable cardiac silhouette. Median sternotomy wires and CABG vascular clips. Atherosclerotic calcification of the aorta. No acute osseous pathology. IMPRESSION: No active disease. Electronically Signed   By: Elgie Collard M.D.   On: 10/18/2023 23:18      Assessment/Plan Active Problems:   Hypotension   CAD (coronary artery disease)   Chest pain   Chronic combined systolic (congestive) and diastolic (congestive) heart failure (HCC)   HLD (hyperlipidemia)   Diabetes mellitus without complication (HCC)   Chronic respiratory failure with hypoxia (HCC)   Essential hypertension   COPD (chronic obstructive pulmonary disease) (HCC)   Left hip pain   Generalized weakness   Depression with anxiety   Overweight (BMI 25.0-29.9)   Atrial fibrillation, chronic (HCC)   Assessment and Plan:  Hypotension: Blood pressure was 88/61 which improved to 91/68 after giving 500 cc normal saline.  Etiology is not completely clear, but no signs of infection.  No fever or leukocytosis.  Does not seem to have sepsis.  Possibly due to volume depletion since patient is clinically dry, and continuation for multiple blood pressure medications and diuretics.  A potential differential diagnosis is  cardiogenic shock, but blood pressure has responded to IV fluid, this possibility is less likely.  -Placed on PCU for observation -IV fluid: 500 cc normal saline, then 75 cc/h for 6 hours -Monitor blood pressure closely -Hold Coreg, Imdur, spironolactone, nitroglycerin  CAD (coronary artery disease) and chest pain: pt is s/p of CABG and stent placement.  He has chronic chest pain.  Initial troponin 10. -Avoid using IV narcotic and nitroglycerin due to hypotension -As needed Percocet and Tylenol for pain -Trend troponin -Check A1c, FLP -UDS -Lipitor and Ranexa -pt is on Eliquis, will not give ASA -f/u CXR --> negative  Chronic combined systolic (congestive) and  diastolic (congestive) heart failure (HCC): 2D echo on 08/08/2023 showed EF 30-35% with grade 2 diastolic dysfunction.  Patient does not have leg edema and JVD.  Patient is clinically dry on examination.  BNP 196. -Hold spironolactone due to hypotension  HLD (hyperlipidemia) -Lipitor  Diabetes mellitus without complication (HCC): Recent A1c 6.1, well-controlled.  Patient is taking [place] and metformin -Sliding scale insulin  Chronic respiratory failure with hypoxia (HCC) and COPD: Stable.  Patient is on home level 2 L oxygen with 97% of saturation -Bronchodilators and prn Mucinex  Essential hypertension -Hold all blood pressure medications due to hypotension  Left hip pain and low back pain: These are chronic issues. -As needed Percocet, Tylenol -As needed Robaxin  Generalized weakness: Likely multifactorial etiology. -PT/OT -Fall precaution -Frequent neurocheck due to somnolence  Depression with anxiety -Continue home medications  Overweight (BMI 25.0-29.9): Body weight 83.5 kg, BMI 28.82 -Encourage losing weight -Exercise healthy diet  Chronic A-fib: Heart rate 82 -Continue Eliquis -Hold Coreg due to hypotension   DVT ppx:  on Eliquis  Code Status: DNR (I discussed with patient and explained the meaning of CODE STATUS. Patient wants to be DNR)  Family Communication: not done, no family member is at bed side.    Disposition Plan:  Anticipate discharge back to previous environment  Consults called:  none  Admission status and Level of care: Progressive:   for obs     Dispo: The patient is from: Home              Anticipated d/c is to: Home              Anticipated d/c date is: 1 day              Patient currently is not medically stable to d/c.    Severity of Illness:  The appropriate patient status for this patient is OBSERVATION. Observation status is judged to be reasonable and necessary in order to provide the required intensity of service to ensure the  patient's safety. The patient's presenting symptoms, physical exam findings, and initial radiographic and laboratory data in the context of their medical condition is felt to place them at decreased risk for further clinical deterioration. Furthermore, it is anticipated that the patient will be medically stable for discharge from the hospital within 2 midnights of admission.        Date of Service 10/19/2023    Lorretta Harp [facility]   If 7PM-7AM, please contact night-coverage [email or web address] 10/19/2023, 12:37 AM

## 2023-10-18 NOTE — ED Provider Notes (Signed)
[facility] Provider Note    Event Date/Time   First MD Initiated Contact with Patient 10/18/23 1504     (approximate)   History   Fatigue   HPI  Lawrence [name] is a 67 y.o. male with a history of paroxysmal A-fib, bradycardia, CAD, chronic chest pain, CHF, COPD, chronic respiratory failure, hypertension, and celiac disease who presents with generalized weakness over the last few days, primarily manifesting as increased somnolence.  The patient states that he has been feeling very sleepy since yesterday and spent most of the day in bed.  He also reports some chest pain and atraumatic left hip pain.  He denies any significant increase in his shortness of breath.  He states he mainly just feels weak and tired.  He has no vomiting or diarrhea.  He states he has some difficulty initiating urination but no dysuria or hematuria.  I reviewed the past medical records.  The patient was seen by me in the ED with chest pain with borderline elevated troponins on 11/15 and subsequently admitted to the hospitalist service for ACS rule out.  He had a stable hospital course and was discharged on 11/17.  Physical Exam   Triage Vital Signs: ED Triage Vitals  Encounter Vitals Group     BP 10/18/23 1427 (!) 126/91     Systolic BP Percentile --      Diastolic BP Percentile --      Pulse Rate 10/18/23 1427 70     Resp 10/18/23 1427 11     Temp 10/18/23 1427 97.6 F (36.4 C)     Temp Source 10/18/23 1427 Oral     SpO2 10/18/23 1426 99 %     Weight 10/18/23 1429 184 lb (83.5 kg)     Height 10/18/23 1429 5\' 7"  (1.702 m)     Head Circumference --      Peak Flow --      Pain Score 10/18/23 1428 10     Pain Loc --      Pain Education --      Exclude from Growth Chart --     Most recent vital signs: Vitals:   10/18/23 1900 10/18/23 1930  BP: (!) 88/61 90/64  Pulse: 82 77  Resp: 10 16  Temp:    SpO2: 97% 91%     General: Alert and oriented, no distress.  CV:  Good  peripheral perfusion.  Resp:  Normal effort.  Diminished breath sounds with no rales or wheezes. Abd:  Soft with no focal tenderness.  No distention.  Other:  Dry mucous membranes.  EOMI.  PERRLA.  Normal speech.  No facial droop.  Motor intact in all extremities.  No significant peripheral edema.   ED Results / Procedures / Treatments   Labs (all labs ordered are listed, but only abnormal results are displayed) Labs Reviewed  COMPREHENSIVE METABOLIC PANEL - Abnormal; Notable for the following components:      Result Value   Glucose, Bld 133 (*)    Calcium 8.6 (*)    Total Protein 5.5 (*)    Albumin 3.1 (*)    All other components within normal limits  BRAIN NATRIURETIC PEPTIDE - Abnormal; Notable for the following components:   B Natriuretic Peptide 196.3 (*)    All other components within normal limits  URINALYSIS, ROUTINE W REFLEX MICROSCOPIC - Abnormal; Notable for the following components:   Color, Urine YELLOW (*)    APPearance CLEAR (*)  Glucose, UA >=500 (*)    Bacteria, UA RARE (*)    All other components within normal limits  RESP PANEL BY RT-PCR (RSV, FLU A&B, COVID)  [id number]  CBC WITH DIFFERENTIAL/PLATELET  URINE DRUG SCREEN, QUALITATIVE ([facility] ONLY)  BASIC METABOLIC PANEL  CBC  TROPONIN I (HIGH SENSITIVITY)     EKG  ED ECG REPORT I, Dionne Bucy, the attending physician, personally viewed and interpreted this ECG.  Date: 10/18/2023 EKG Time: 1426 Rate: 72 Rhythm: Atrial fibrillation QRS Axis: normal Intervals: normal ST/T Wave abnormalities: Nonspecific ST abnormalities Narrative Interpretation: no evidence of acute ischemia; no significant change when compared to EKG of 11/16    RADIOLOGY    PROCEDURES:  Critical Care performed: No  Procedures   MEDICATIONS ORDERED IN ED: Medications  midodrine (PROAMATINE) tablet 10 mg (has no administration in time range)  0.9 %  sodium chloride infusion (has no administration in time range)   ipratropium (ATROVENT) nebulizer solution 0.5 mg (has no administration in time range)  ondansetron (ZOFRAN) injection 4 mg (has no administration in time range)  acetaminophen (TYLENOL) tablet 650 mg (has no administration in time range)  insulin aspart (novoLOG) injection 0-5 Units (has no administration in time range)  insulin aspart (novoLOG) injection 0-9 Units (has no administration in time range)  enoxaparin (LOVENOX) injection 40 mg (has no administration in time range)  oxyCODONE-acetaminophen (PERCOCET) 10-325 MG per tablet 1 tablet (has no administration in time range)  acetaminophen (TYLENOL) tablet 1,000 mg (1,000 mg Oral Given 10/18/23 1547)  ondansetron (ZOFRAN-ODT) disintegrating tablet 4 mg (4 mg Oral Given 10/18/23 1702)  fentaNYL (SUBLIMAZE) injection 25 mcg (25 mcg Intravenous Given 10/18/23 1907)  sodium chloride 0.9 % bolus 500 mL (500 mLs Intravenous New Bag/Given 10/18/23 2010)     IMPRESSION / MDM / ASSESSMENT AND PLAN / ED COURSE  I reviewed the triage vital signs and the nursing notes.  67 year old male with PMH as noted above presents with generalized weakness and increased somnolence over the last 1 to 2 days.  On exam the patient has normal vital signs.  O2 saturation is 100% on his usual 2 L O2.  Exam is otherwise unremarkable for acute findings.  Differential diagnosis includes, but is not limited to, CHF exacerbation, dehydration, electrolyte abnormality, other metabolic cause, UTI, viral syndrome, or other infection.  We will obtain lab workup and reassess.  Patient's presentation is most consistent with acute presentation with potential threat to life or bodily function.  The patient is on the cardiac monitor to evaluate for evidence of arrhythmia and/or significant heart rate changes.  ----------------------------------------- 8:12 PM on 10/18/2023 -----------------------------------------  Lab workup is reassuring.  CMP shows no AKI.  BNP is not  elevated.  CBC shows no leukocytosis or anemia.  Initial troponin is negative.  Urinalysis is negative for UTI.  However, the patient remains persistently hypotensive.  I suspect that he is dehydrated and/or hypovolemic.  He may be over diuresed.  I have ordered a fluid bolus.  He will need admission for further workup and monitoring.  I consulted Dr. Clyde Lundborg from the hospitalist service; based on our discussion he agrees to evaluate the patient for admission.    FINAL CLINICAL IMPRESSION(S) / ED DIAGNOSES   Final diagnoses:  Generalized weakness  Hypotension, unspecified hypotension type     Rx / DC Orders   ED Discharge Orders     None        Note:  This document was prepared using Dragon voice  recognition software and may include unintentional dictation errors.    Dionne Bucy, MD 10/18/23 2013

## 2023-10-19 DIAGNOSIS — I482 Chronic atrial fibrillation, unspecified: Secondary | ICD-10-CM | POA: Diagnosis present

## 2023-10-19 DIAGNOSIS — I959 Hypotension, unspecified: Secondary | ICD-10-CM | POA: Diagnosis not present

## 2023-10-19 DIAGNOSIS — I9589 Other hypotension: Secondary | ICD-10-CM

## 2023-10-19 HISTORY — DX: Chronic atrial fibrillation, unspecified: I48.20

## 2023-10-19 LAB — BASIC METABOLIC PANEL
Anion gap: 5 (ref 5–15)
BUN: 16 mg/dL (ref 8–23)
CO2: 26 mmol/L (ref 22–32)
Calcium: 8.7 mg/dL — ABNORMAL LOW (ref 8.9–10.3)
Chloride: 106 mmol/L (ref 98–111)
Creatinine, Ser: 1.03 mg/dL (ref 0.61–1.24)
GFR, Estimated: >60 mL/min (ref >60)
Glucose, Bld: 104 mg/dL — ABNORMAL HIGH (ref 70–99)
Potassium: 4.4 mmol/L (ref 3.5–5.1)
Sodium: 137 mmol/L (ref 135–145)

## 2023-10-19 LAB — GLUCOSE, CAPILLARY
Glucose-Capillary: 114 mg/dL — ABNORMAL HIGH (ref 70–99)
Glucose-Capillary: 161 mg/dL — ABNORMAL HIGH (ref 70–99)

## 2023-10-19 LAB — CBC
HCT: 45.5 % (ref 39.0–52.0)
Hemoglobin: 14.9 g/dL (ref 13.0–17.0)
MCH: 29.8 pg (ref 26.0–34.0)
MCHC: 32.7 g/dL (ref 30.0–36.0)
MCV: 91 fL (ref 80.0–100.0)
Platelets: 131 10*3/uL — ABNORMAL LOW (ref 150–400)
RBC: 5 MIL/uL (ref 4.22–5.81)
RDW: 14.8 % (ref 11.5–15.5)
WBC: 6.5 10*3/uL (ref 4.0–10.5)
nRBC: 0 % (ref 0.0–0.2)

## 2023-10-19 LAB — LIPID PANEL
Cholesterol: 140 mg/dL (ref 0–200)
HDL: 29 mg/dL — ABNORMAL LOW (ref >40)
LDL Cholesterol: 80 mg/dL (ref 0–99)
Total CHOL/HDL Ratio: 4.8 {ratio}
Triglycerides: 156 mg/dL — ABNORMAL HIGH (ref <150)
VLDL: 31 mg/dL (ref 0–40)

## 2023-10-19 LAB — TROPONIN I (HIGH SENSITIVITY)
Troponin I (High Sensitivity): 10 ng/L (ref <18)
Troponin I (High Sensitivity): 11 ng/L (ref <18)

## 2023-10-19 LAB — CBG MONITORING, ED
Glucose-Capillary: 94 mg/dL (ref 70–99)
Glucose-Capillary: 86 mg/dL (ref 70–99)

## 2023-10-19 LAB — HEMOGLOBIN A1C
Hgb A1c MFr Bld: 6.2 % — ABNORMAL HIGH (ref 4.8–5.6)
Mean Plasma Glucose: 131.24 mg/dL

## 2023-10-19 MED ORDER — DICYCLOMINE HCL 10 MG PO CAPS: 10.0000 mg | ORAL_CAPSULE | Freq: Three times a day (TID) | ORAL | Status: DC | PRN

## 2023-10-19 MED ORDER — ATORVASTATIN CALCIUM 80 MG PO TABS
80.0000 mg | ORAL_TABLET | Freq: Every day | ORAL | Status: DC
  Administered 2023-10-19 – 2023-10-20 (×3): 80 mg via ORAL
  Filled 2023-10-19: qty 4
  Filled 2023-10-19 (×2): qty 1

## 2023-10-19 MED ORDER — MOMETASONE FURO-FORMOTEROL FUM 200-5 MCG/ACT IN AERO
2.0000 | INHALATION_SPRAY | Freq: Two times a day (BID) | RESPIRATORY_TRACT | Status: DC
  Administered 2023-10-19 – 2023-10-21 (×4): 2 via RESPIRATORY_TRACT
  Filled 2023-10-19 (×2): qty 8.8

## 2023-10-19 MED ORDER — METHOCARBAMOL 500 MG PO TABS
500.0000 mg | ORAL_TABLET | Freq: Three times a day (TID) | ORAL | Status: DC | PRN
  Administered 2023-10-21: 500 mg via ORAL
  Filled 2023-10-19: qty 1

## 2023-10-19 MED ORDER — PANTOPRAZOLE SODIUM 40 MG PO TBEC
40.0000 mg | DELAYED_RELEASE_TABLET | Freq: Two times a day (BID) | ORAL | Status: DC
  Administered 2023-10-19 – 2023-10-21 (×6): 40 mg via ORAL
  Filled 2023-10-19 (×6): qty 1

## 2023-10-19 MED ORDER — HYDROMORPHONE HCL 1 MG/ML IJ SOLN
1.0000 mg | INTRAMUSCULAR | Status: DC | PRN
  Administered 2023-10-19 – 2023-10-20 (×4): 1 mg via INTRAVENOUS
  Filled 2023-10-19 (×4): qty 1

## 2023-10-19 MED ORDER — BUDESON-GLYCOPYRROL-FORMOTEROL 160-9-4.8 MCG/ACT IN AERO: 2.0000 | INHALATION_SPRAY | Freq: Two times a day (BID) | RESPIRATORY_TRACT | Status: DC

## 2023-10-19 MED ORDER — UMECLIDINIUM BROMIDE 62.5 MCG/ACT IN AEPB
1.0000 | INHALATION_SPRAY | Freq: Every day | RESPIRATORY_TRACT | Status: DC
  Administered 2023-10-19 – 2023-10-21 (×3): 1 via RESPIRATORY_TRACT
  Filled 2023-10-19 (×2): qty 7

## 2023-10-19 MED ORDER — LORATADINE 10 MG PO TABS
10.0000 mg | ORAL_TABLET | Freq: Every day | ORAL | Status: DC
  Administered 2023-10-19 – 2023-10-21 (×3): 10 mg via ORAL
  Filled 2023-10-19 (×3): qty 1

## 2023-10-19 MED ORDER — RANOLAZINE ER 500 MG PO TB12
1000.0000 mg | ORAL_TABLET | Freq: Two times a day (BID) | ORAL | Status: DC
  Administered 2023-10-19: 1000 mg via ORAL
  Filled 2023-10-19 (×2): qty 2

## 2023-10-19 MED ORDER — VENLAFAXINE HCL ER 75 MG PO CP24
75.0000 mg | ORAL_CAPSULE | Freq: Every day | ORAL | Status: DC
  Administered 2023-10-19 – 2023-10-21 (×3): 75 mg via ORAL
  Filled 2023-10-19 (×4): qty 1

## 2023-10-19 MED ORDER — VITAMIN B-12 1000 MCG PO TABS
1000.0000 ug | ORAL_TABLET | Freq: Every day | ORAL | Status: DC
  Administered 2023-10-19 – 2023-10-21 (×3): 1000 ug via ORAL
  Filled 2023-10-19 (×3): qty 1

## 2023-10-19 MED ORDER — DAPAGLIFLOZIN PROPANEDIOL 10 MG PO TABS
10.0000 mg | ORAL_TABLET | Freq: Every day | ORAL | Status: DC
  Administered 2023-10-19 – 2023-10-21 (×3): 10 mg via ORAL
  Filled 2023-10-19 (×3): qty 1

## 2023-10-19 NOTE — Progress Notes (Signed)
PROGRESS NOTE   HPI was taken from Dr. Clyde Lundborg:  Lawrence [name] is a 67 y.o. male with medical history significant of CAD (s/p of stent x 4 and CBAG 2016), sCHF with EF 30-35%, a fib on Eliquis, PSVT,  HTN, HLD, DM, COPD on 2L O2, OSA not using CPAP, dCHF, anxiety, celiac disease, pancreatitis, who presents with weakness, chest pain.   Patient states that he has not been feeling well in the past several days, with fatigue and generalized weakness. He reports chest pain which is a chronic issue.  Patient has chronic chest pain, which is located in the substernal area, intermittent, pressure-like, nonradiating, 8 out of 10 in severity.  Not aggravated or alleviated by any known factors.  Patient has chronic dry cough, and mild shortness of breath due to COPD, which has not changed.  No fever or chills.  Patient has nausea, no vomiting, diarrhea or abdominal pain.  He has poor appetite and decreased oral intake.  No symptoms of UTI.  He has chronic left hip pain and back pain.  No recent fall or injury. Pt is somnolent during the interview, but easily arousable, orientated x 3.  He moves all extremities normally.  No facial droop or slurred speech.   Patient was found to have hypotension with blood pressure 88/61, which improved to 91/68 after giving 500 cc normal saline bolus in ED.   Data reviewed independently and ED Course: pt was found to have troponin 10, WBC 8.6, GFR> 60, negative PCR for COVID, BMP 196.3, temperature normal, heart rate 82, RR 17, oxygen sat 97% on room air. Pt is placed in tele bed for obs.     Lawrence [name]  UEA:[id number] DOB: [birth year] DOA: 10/18/2023 PCP: Lawrence Limes, MD   Assessment & Plan:   Active Problems:   Hypotension   CAD (coronary artery disease)   Chest pain   Chronic combined systolic (congestive) and diastolic (congestive) heart failure (HCC)   HLD (hyperlipidemia)   Diabetes mellitus without complication (HCC)   Chronic respiratory failure with  hypoxia (HCC)   Essential hypertension   COPD (chronic obstructive pulmonary disease) (HCC)   Left hip pain   Generalized weakness   Depression with anxiety   Overweight (BMI 25.0-29.9)   Atrial fibrillation, chronic (HCC)  Assessment and Plan: Hypotension: etiology unclear, possibly secondary to volume depletion vs BP meds. Holding all BP meds. Continue on IVFs.   Hx CAD: w/ hx of CABG and stent placement. Continue on home dose of eliquis but holding all BP/cardiac meds.    Chronic combined CHF: (HCC): appears compensated. Monitor I/Os.    HLD: continue on statin    DM2: well controlled. Continue on SSI w/ accuchecks    Chronic hypoxic respiratory failure: continue on supplemental oxygen. Uses 2L [place] at home   HTN: holding all home anti-HTN meds    Left hip pain and low back pain: chronic. Percocet, robaxin prn   Generalized weakness: PT/OT consulted    Depression: severity unknown. Continue on home dose of venlafaxine    Overweight: BMI 28.8    Chronic A-fib: continue on eliquis         DVT prophylaxis: eliquis  Code Status: DNR Family Communication:  Disposition Plan: depends on PT/OT recs   Level of care: Progressive Consultants:    Procedures: (  Antimicrobials:    Subjective: Pt c/o back pain   Objective: Vitals:   10/19/23 0600 10/19/23 0700 10/19/23 0830 10/19/23 0831  BP: 102/67  113/79 109/70   Pulse: 77 (!) 44 73   Resp:  16 12   Temp:    98.3 F (36.8 C)  TempSrc:    Oral  SpO2: 98% 98% 98%   Weight:      Height:        Intake/Output Summary (Last 24 hours) at 10/19/2023 7829 Last data filed at 10/18/2023 2134 Gross per 24 hour  Intake 500 ml  Output --  Net 500 ml   Filed Weights   10/18/23 1429  Weight: 83.5 kg    Examination:  General exam: Appears calm and comfortable  Respiratory system: decreased breath sounds b/l  Cardiovascular system: S1 & S2 +. No rubs, gallops or clicks.  Gastrointestinal system: Abdomen is  nondistended, soft and nontender.Normal bowel sounds heard. Central nervous system: Alert and oriented. Psychiatry: Judgement and insight appear normal. Flat mood and affect    Data Reviewed: I have personally reviewed following labs and imaging studies  CBC: Recent Labs  Lab 10/18/23 1549 10/19/23 0445  WBC 8.6 6.5  NEUTROABS 6.6  --   HGB 16.1 14.9  HCT 50.6 45.5  MCV 93.2 91.0  PLT 151 131*   Basic Metabolic Panel: Recent Labs  Lab 10/18/23 1549 10/19/23 0445  NA 140 137  K 4.6 4.4  CL 105 106  CO2 29 26  GLUCOSE 133* 104*  BUN 12 16  CREATININE 1.07 1.03  CALCIUM 8.6* 8.7*   GFR: Estimated Creatinine Clearance: 70 mL/min (by C-G formula based on SCr of 1.03 mg/dL). Liver Function Tests: Recent Labs  Lab 10/18/23 1549  AST 18  ALT 14  ALKPHOS 44  BILITOT 1.1  PROT 5.5*  ALBUMIN 3.1*   No results for input(s): "LIPASE", "AMYLASE" in the last 168 hours. No results for input(s): "AMMONIA" in the last 168 hours. Coagulation Profile: No results for input(s): "INR", "PROTIME" in the last 168 hours. Cardiac Enzymes: No results for input(s): "CKTOTAL", "CKMB", "CKMBINDEX", "TROPONINI" in the last 168 hours. BNP (last 3 results) No results for input(s): "PROBNP" in the last 8760 hours. HbA1C: Recent Labs    10/18/23 1432  HGBA1C 6.2*   CBG: Recent Labs  Lab 10/18/23 2134 10/19/23 0747  GLUCAP 92 94   Lipid Profile: Recent Labs    10/19/23 0445  CHOL 140  HDL 29*  LDLCALC 80  TRIG 562*  CHOLHDL 4.8   Thyroid Function Tests: No results for input(s): "TSH", "T4TOTAL", "FREET4", "T3FREE", "THYROIDAB" in the last 72 hours. Anemia Panel: No results for input(s): "VITAMINB12", "FOLATE", "FERRITIN", "TIBC", "IRON", "RETICCTPCT" in the last 72 hours. Sepsis Labs: No results for input(s): "PROCALCITON", "LATICACIDVEN" in the last 168 hours.  Recent Results (from the past 240 hour(s))  Resp panel by RT-PCR (RSV, Flu A&B, Covid) Anterior Nasal Swab      Status: None   Collection Time: 10/18/23  3:46 PM   Specimen: Anterior Nasal Swab  Result Value Ref Range Status   SARS Coronavirus 2 by RT PCR NEGATIVE NEGATIVE Final    Comment: (NOTE) SARS-CoV-2 target nucleic acids are NOT DETECTED.  The SARS-CoV-2 RNA is generally detectable in upper respiratory specimens during the acute phase of infection. The lowest concentration of SARS-CoV-2 viral copies this assay can detect is 138 copies/mL. A negative result does not preclude SARS-Cov-2 infection and should not be used as the sole basis for treatment or other patient management decisions. A negative result may occur with  improper specimen collection/handling, submission of specimen other than nasopharyngeal swab, presence  of viral mutation(s) within the areas targeted by this assay, and inadequate number of viral copies(<138 copies/mL). A negative result must be combined with clinical observations, patient history, and epidemiological information. The expected result is Negative.  Fact Sheet for Patients:  BloggerCourse.com  Fact Sheet for Healthcare Providers:  SeriousBroker.it  This test is no t yet approved or cleared by the [place] FDA and  has been authorized for detection and/or diagnosis of SARS-CoV-2 by FDA under an Emergency Use Authorization (EUA). This EUA will remain  in effect (meaning this test can be used) for the duration of the COVID-19 declaration under Section 564(b)(1) of the Act, 21 U.S.C.section 360bbb-3(b)(1), unless the authorization is terminated  or revoked sooner.       Influenza A by PCR NEGATIVE NEGATIVE Final   Influenza B by PCR NEGATIVE NEGATIVE Final    Comment: (NOTE) The Xpert Xpress SARS-CoV-2/FLU/RSV plus assay is intended as an aid in the diagnosis of influenza from Nasopharyngeal swab specimens and should not be used as a sole basis for treatment. Nasal washings and aspirates are  unacceptable for Xpert Xpress SARS-CoV-2/FLU/RSV testing.  Fact Sheet for Patients: BloggerCourse.com  Fact Sheet for Healthcare Providers: SeriousBroker.it  This test is not yet approved or cleared by the [place] FDA and has been authorized for detection and/or diagnosis of SARS-CoV-2 by FDA under an Emergency Use Authorization (EUA). This EUA will remain in effect (meaning this test can be used) for the duration of the COVID-19 declaration under Section 564(b)(1) of the Act, 21 U.S.C. section 360bbb-3(b)(1), unless the authorization is terminated or revoked.     Resp Syncytial Virus by PCR NEGATIVE NEGATIVE Final    Comment: (NOTE) Fact Sheet for Patients: BloggerCourse.com  Fact Sheet for Healthcare Providers: SeriousBroker.it  This test is not yet approved or cleared by the [place] FDA and has been authorized for detection and/or diagnosis of SARS-CoV-2 by FDA under an Emergency Use Authorization (EUA). This EUA will remain in effect (meaning this test can be used) for the duration of the COVID-19 declaration under Section 564(b)(1) of the Act, 21 U.S.C. section 360bbb-3(b)(1), unless the authorization is terminated or revoked.  Performed at [facility], [address] [place], [place] [address]          Radiology Studies: [facility] Chest [place] 1 View  Result Date: 10/18/2023 CLINICAL DATA:  Chest pain. EXAM: PORTABLE CHEST 1 VIEW COMPARISON:  Chest radiograph dated 10/01/2023. FINDINGS: No focal consolidation, pleural effusion, pneumothorax. Stable cardiac silhouette. Median sternotomy wires and CABG vascular clips. Atherosclerotic calcification of the aorta. No acute osseous pathology. IMPRESSION: No active disease. Electronically Signed   By: Elgie Collard M.D.   On: 10/18/2023 23:18        Scheduled Meds:  apixaban  5 mg Oral BID    atorvastatin  80 mg Oral QHS   cyanocobalamin  1,000 mcg Oral Daily   dapagliflozin propanediol  10 mg Oral Daily   insulin aspart  0-5 Units Subcutaneous QHS   insulin aspart  0-9 Units Subcutaneous TID WC   loratadine  10 mg Oral Daily   mometasone-formoterol  2 puff Inhalation BID   pantoprazole  40 mg Oral BID   ranolazine  1,000 mg Oral BID   traZODone  100 mg Oral QHS   umeclidinium bromide  1 puff Inhalation Daily   venlafaxine XR  75 mg Oral Q breakfast   Continuous Infusions:   LOS: 0 days      Verna Hamon M  Mayford Knife, MD [facility] Pager [phone]  If 7PM-7AM, please contact night-coverage [email or web address]  10/19/2023, 9:03 AM

## 2023-10-19 NOTE — ED Notes (Signed)
0800 meds requested from [facility]

## 2023-10-19 NOTE — Evaluation (Signed)
Physical Therapy Evaluation Patient Details Name: Lawrence [name] MRN: [id number] DOB: [birth year] Today's Date: 10/19/2023  History of Present Illness  Pt is a 67 y.o. male presenting to hospital 10/18/23 with c/o generalized weakness and increased somnolence; also c/o some chest pain and atraumatic L hip pain.  Pt admitted with hypotension, CAD, chest pain (pt with chronic chest pain).  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  Clinical Impression  Prior to recent medical concerns, pt was ambulatory with rollator; lives in a 1 level home (ramp to enter); grandson stays with pt and available to assist 24/7.  9/10 L chest, LBP, and L hip pain reported during session (pt reports chronic in general and L chest pain has been consistent for past 2 days)--nurse notified.  Currently pt is modified independent supine to sitting EOB; CGA with transfer; and CGA to sidestep to L along bed with hand hold assist.  Limited activity per pt request (d/t pain and fatigue).  Pt reports he feels he will be able to manage at home with grandson's help when medically cleared to discharge.  Pt would currently benefit from skilled PT to address noted impairments and functional limitations (see below for any additional details).  Upon hospital discharge, pt would benefit from ongoing therapy.     If plan is discharge home, recommend the following: A little help with walking and/or transfers;A little help with bathing/dressing/bathroom;Assistance with cooking/housework;Assist for transportation;Help with stairs or ramp for entrance   Can travel by private vehicle    Yes    Equipment Recommendations Rolling walker (2 wheels)  Recommendations for Other Services       Functional Status Assessment Patient has had a recent decline in  their functional status and demonstrates the ability to make significant improvements in function in a reasonable and predictable amount of time.     Precautions / Restrictions Precautions Precautions: Fall Precaution Comments: Monitor BP Restrictions Weight Bearing Restrictions: No      Mobility  Bed Mobility Overal bed mobility: Modified Independent             General bed mobility comments: Mild increased effort to perform on own (bed flat; supine to sit)    Transfers Overall transfer level: Needs assistance Equipment used: 1 person hand held assist Transfers: Sit to/from Stand             General transfer comment: pt steady standing from ED stretcher bed (CGA)    Ambulation/Gait Ambulation/Gait assistance: Contact guard assist Gait Distance (Feet):  (pt side-stepped to L along ED stretcher bed a few feet) Assistive device: 1 person hand held assist   Gait velocity: decreased     General Gait Details: steady taking steps with single UE support; limited mobility per pt request  Stairs            Wheelchair Mobility     Tilt Bed    Modified Rankin (Stroke Patients Only)       Balance Overall balance assessment: Needs assistance Sitting-balance support: No upper extremity supported, Feet supported Sitting balance-Leahy Scale: Normal Sitting balance - Comments: steady reaching outside BOS   Standing balance support: No upper extremity supported Standing balance-Leahy Scale: Good Standing balance comment: steady reaching within BOS  Pertinent Vitals/Pain Pain Assessment Pain Assessment: 0-10 Pain Score: 9  Pain Location: L chest, L hip, and LBP (chronic) (nurse notified of pt's pain (including L chest pain)) Pain Descriptors / Indicators: Aching, Discomfort, Constant Pain Intervention(s): Limited activity within patient's tolerance, Monitored during session, Repositioned, RN gave pain meds during  session Vitals (HR and SpO2 on 2 L via nasal cannula) stable and WFL throughout treatment session.    Home Living Family/patient expects to be discharged to:: Private residence Living Arrangements: Other relatives (Pt's grandson lives with pt) Available Help at Discharge: Family;Available 24 hours/day (Pt's grandson) Type of Home: Apartment (1st floor) Home Access: Level entry;Ramped entrance       Home Layout: One level Home Equipment: Shower seat - built in;Rollator (4 wheels);BSC/3in1      Prior Function Prior Level of Function : Independent/Modified Independent;History of Falls (last six months)             Mobility Comments: Modified independent ambulating with rollator.  2 L O2 at night (PRN during the day) ADLs Comments: Pt reports that he is essentially independent with basic ADL's but his grandson helps as needed.     Extremity/Trunk Assessment   Upper Extremity Assessment Upper Extremity Assessment: Defer to OT evaluation    Lower Extremity Assessment Lower Extremity Assessment: Generalized weakness    Cervical / Trunk Assessment Cervical / Trunk Assessment: Normal  Communication   Communication Communication: No apparent difficulties Cueing Techniques: Verbal cues  Cognition Arousal: Alert Behavior During Therapy: WFL for tasks assessed/performed Overall Cognitive Status: Within Functional Limits for tasks assessed                                          General Comments  Nursing cleared pt for participation in physical therapy.  Pt agreeable to PT session.    Exercises     Assessment/Plan    PT Assessment Patient needs continued PT services  PT Problem List Decreased strength;Decreased activity tolerance;Decreased balance;Decreased mobility;Pain       PT Treatment Interventions DME instruction;Gait training;Functional mobility training;Therapeutic activities;Therapeutic exercise;Balance training;Patient/family education     PT Goals (Current goals can be found in the Care Plan section)  Acute Rehab PT Goals Patient Stated Goal: to go home PT Goal Formulation: With patient Time For Goal Achievement: 11/02/23 Potential to Achieve Goals: Good    Frequency Min 1X/week     Co-evaluation               AM-PAC PT "6 Clicks" Mobility  Outcome Measure Help needed turning from your back to your side while in a flat bed without using bedrails?: None Help needed moving from lying on your back to sitting on the side of a flat bed without using bedrails?: None Help needed moving to and from a bed to a chair (including a wheelchair)?: A Little Help needed standing up from a chair using your arms (e.g., wheelchair or bedside chair)?: A Little Help needed to walk in hospital room?: A Little Help needed climbing 3-5 steps with a railing? : A Little 6 Click Score: 20    End of Session Equipment Utilized During Treatment: Oxygen (2 L via nasal cannula) Activity Tolerance: Patient tolerated treatment well Patient left:  (sitting on edge of ED stretcher bed with OT present) Nurse Communication: Mobility status;Precautions PT Visit Diagnosis: Other abnormalities of gait and mobility (R26.89);Muscle weakness (generalized) (M62.81)  Time: 8469-6295 PT Time Calculation (min) (ACUTE ONLY): 14 min   Charges:   PT Evaluation $PT Eval Low Complexity: 1 Low   PT General Charges $$ ACUTE PT VISIT: 1 Visit        Hendricks Limes, PT 10/19/23, 12:28 PM

## 2023-10-19 NOTE — Evaluation (Signed)
Occupational Therapy Evaluation Patient Details Name: Lawrence [name] MRN: [id number] DOB: 1956/10/08 Today's Date: 10/19/2023   History of Present Illness Pt is a 67 y.o. male presenting to hospital 10/18/23 with c/o generalized weakness and increased somnolence; also c/o some chest pain and atraumatic L hip pain.  Pt admitted with hypotension, CAD, chest pain (pt with chronic chest pain).  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   Clinical Impression   Lawrence [name] was seen for OT evaluation this date. Prior to hospital admission, pt was generally independent/MOD I for ADL management. He endorses using a 4WW for functional mobility and a BSC placed over his standard toilet in the home. Pt lives with his grandson in a first floor apartment home. Pt presents to acute OT demonstrating impaired ADL performance and functional mobility 2/2 generalized weakness, cardiopulmonary status, and decreased activity tolerance (See OT problem list for additional functional deficits). Pt currently requires SUPERVISION for safety with bed mobility, CGA for safety with brief standing transfer, and SET UP assist for seated UB ADL management at EOB.  Pt would benefit from skilled OT services to address noted impairments and functional limitations (see below for any additional details) in order to maximize safety and independence while minimizing falls risk and caregiver burden. Do not anticipate the need for follow up OT services upon acute hospital DC.        If plan is discharge home, recommend the following: A little help with walking and/or transfers;A little help with bathing/dressing/bathroom;Assist for transportation;Help with stairs or ramp for entrance;Assistance with cooking/housework    Functional Status Assessment   Patient has had a recent decline in their functional status and demonstrates the ability to make significant improvements in function in a reasonable and predictable amount of time.  Equipment Recommendations  None recommended by OT (Pt has necessary equipment)    Recommendations for Other Services       Precautions / Restrictions Precautions Precautions: Fall Precaution Comments: Monitor BP Restrictions Weight Bearing Restrictions: No      Mobility Bed Mobility Overal bed mobility: Modified Independent             General bed mobility comments: Mild increased effort to perform on own (bed flat; sit to supine)    Transfers Overall transfer level: Needs assistance Equipment used: 1 person hand held assist                      Balance Overall balance assessment: Needs assistance Sitting-balance support: No upper extremity supported, Feet supported Sitting balance-Leahy Scale: Normal Sitting balance - Comments: steady reaching outside BOS   Standing balance support: No upper extremity supported Standing balance-Leahy Scale: Good Standing balance comment: steady reaching within BOS                           ADL either performed or assessed with clinical judgement   ADL Overall ADL's : Needs assistance/impaired                                     Functional mobility during ADLs: Cueing for safety;Contact guard assist General ADL Comments: HHA to stand at EOB, pt limited by pain this date declines further functional mobility. Is able to  sit EOB for extended period of time to discuss PLOF and performs UB grooming (face washing) with Set up assist. On 2L [place] t/o session with VSS.     Vision Baseline Vision/History: 1 Wears glasses Ability to See in Adequate Light: 1 Impaired Patient Visual Report: No change from baseline       Perception         Praxis         Pertinent Vitals/Pain Pain Assessment Pain Assessment: 0-10 Pain  Score: 9  Pain Location: L chest, L hip, and LBP (chronic) Pain Descriptors / Indicators: Aching, Discomfort, Constant Pain Intervention(s): Limited activity within patient's tolerance, Monitored during session, Patient requesting pain meds-RN notified     Extremity/Trunk Assessment Upper Extremity Assessment Upper Extremity Assessment: Generalized weakness   Lower Extremity Assessment Lower Extremity Assessment: Generalized weakness   Cervical / Trunk Assessment Cervical / Trunk Assessment: Normal   Communication Communication Communication: No apparent difficulties Cueing Techniques: Verbal cues   Cognition Arousal: Alert Behavior During Therapy: WFL for tasks assessed/performed Overall Cognitive Status: Within Functional Limits for tasks assessed                                       General Comments       Exercises Other Exercises Other Exercises: Pt educated on role of OT in acute setting, safety, falls prevention strategies, and energy conservation techniques for improved safety and functional independence during ADL management in the home and hospital.   Shoulder Instructions      Home Living Family/patient expects to be discharged to:: Private residence Living Arrangements: Other relatives (Gr son) Available Help at Discharge: Family;Available 24 hours/day Type of Home: Apartment Home Access: Level entry;Ramped entrance     Home Layout: One level     Bathroom Shower/Tub: Producer, television/film/video: Standard (BSC over toilet) Bathroom Accessibility: No   Home Equipment: Shower seat - built in;Rollator (4 wheels);BSC/3in1   Additional Comments: O2 2L      Prior Functioning/Environment Prior Level of Function : Independent/Modified Independent;History of Falls (last six months)             Mobility Comments: Modified independent ambulating with rollator.  2 L O2 at night (PRN during the day) ADLs Comments: Pt reports that he  is essentially independent with basic ADL's but his grandson helps as needed.        OT Problem List: Decreased strength;Decreased knowledge of use of DME or AE;Decreased activity tolerance;Cardiopulmonary status limiting activity      OT Treatment/Interventions: Self-care/ADL training;Therapeutic exercise;Energy conservation;DME and/or AE instruction;Patient/family education;Therapeutic activities    OT Goals(Current goals can be found in the care plan section) Acute Rehab OT Goals Patient Stated Goal: To go home OT Goal Formulation: With patient Time For Goal Achievement: 11/02/23 Potential to Achieve Goals: Good ADL Goals Pt Will Perform Grooming: sitting;with modified independence Pt Will Perform Lower Body Dressing: sit to/from stand;with modified independence;with adaptive equipment Pt Will Transfer to Toilet: bedside commode;regular height toilet;with modified independence;ambulating Pt Will Perform Toileting - Clothing Manipulation and hygiene: with modified independence;with adaptive equipment;sit to/from stand  OT Frequency: Min 1X/week    Co-evaluation              AM-PAC OT "6 Clicks" Daily Activity     Outcome Measure Help from another person eating meals?: None Help from another person taking care of personal grooming?: None  Help from another person toileting, which includes using toliet, bedpan, or urinal?: A Little Help from another person bathing (including washing, rinsing, drying)?: A Little Help from another person to put on and taking off regular upper body clothing?: None Help from another person to put on and taking off regular lower body clothing?: A Little 6 Click Score: 21   End of Session Equipment Utilized During Treatment: Oxygen  Activity Tolerance: Patient tolerated treatment well Patient left: in bed;with call bell/phone within reach  OT Visit Diagnosis: Other abnormalities of gait and mobility (R26.89);Muscle weakness (generalized) (M62.81)                 Time: 7564-3329 OT Time Calculation (min): 14 min Charges:  OT General Charges $OT Visit: 1 Visit OT Evaluation $OT Eval Moderate Complexity: 1 Mod  Rockney Ghee, M.S., OTR/L 10/19/23, 12:34 PM

## 2023-10-20 DIAGNOSIS — I952 Hypotension due to drugs: Secondary | ICD-10-CM | POA: Diagnosis not present

## 2023-10-20 DIAGNOSIS — I959 Hypotension, unspecified: Secondary | ICD-10-CM | POA: Diagnosis not present

## 2023-10-20 LAB — BASIC METABOLIC PANEL
Anion gap: 5 (ref 5–15)
BUN: 16 mg/dL (ref 8–23)
CO2: 31 mmol/L (ref 22–32)
Calcium: 9.2 mg/dL (ref 8.9–10.3)
Chloride: 103 mmol/L (ref 98–111)
Creatinine, Ser: 1.15 mg/dL (ref 0.61–1.24)
GFR, Estimated: >60 mL/min (ref >60)
Glucose, Bld: 123 mg/dL — ABNORMAL HIGH (ref 70–99)
Potassium: 4.7 mmol/L (ref 3.5–5.1)
Sodium: 139 mmol/L (ref 135–145)

## 2023-10-20 LAB — CBC
HCT: 47.1 % (ref 39.0–52.0)
Hemoglobin: 15 g/dL (ref 13.0–17.0)
MCH: 29.1 pg (ref 26.0–34.0)
MCHC: 31.8 g/dL (ref 30.0–36.0)
MCV: 91.5 fL (ref 80.0–100.0)
Platelets: 130 10*3/uL — ABNORMAL LOW (ref 150–400)
RBC: 5.15 MIL/uL (ref 4.22–5.81)
RDW: 14.5 % (ref 11.5–15.5)
WBC: 5.9 10*3/uL (ref 4.0–10.5)
nRBC: 0 % (ref 0.0–0.2)

## 2023-10-20 LAB — GLUCOSE, CAPILLARY
Glucose-Capillary: 110 mg/dL — ABNORMAL HIGH (ref 70–99)
Glucose-Capillary: 166 mg/dL — ABNORMAL HIGH (ref 70–99)
Glucose-Capillary: 122 mg/dL — ABNORMAL HIGH (ref 70–99)
Glucose-Capillary: 125 mg/dL — ABNORMAL HIGH (ref 70–99)

## 2023-10-20 MED ORDER — DOCUSATE SODIUM 100 MG PO CAPS
100.0000 mg | ORAL_CAPSULE | Freq: Two times a day (BID) | ORAL | Status: DC
  Administered 2023-10-20 – 2023-10-21 (×3): 100 mg via ORAL
  Filled 2023-10-20 (×3): qty 1

## 2023-10-20 MED ORDER — CARVEDILOL 3.125 MG PO TABS
3.1250 mg | ORAL_TABLET | Freq: Two times a day (BID) | ORAL | Status: DC
  Administered 2023-10-20 – 2023-10-21 (×3): 3.125 mg via ORAL
  Filled 2023-10-20 (×3): qty 1

## 2023-10-20 MED ORDER — ISOSORBIDE MONONITRATE ER 60 MG PO TB24
60.0000 mg | ORAL_TABLET | Freq: Every day | ORAL | Status: DC
  Administered 2023-10-20 – 2023-10-21 (×2): 60 mg via ORAL
  Filled 2023-10-20 (×2): qty 1

## 2023-10-20 NOTE — TOC Progression Note (Addendum)
Transition of Care [facility]) - Progression Note    Patient Details  Name: Lawrence [name] MRN: [id number] Date of Birth: [birth year]  [facility] CM/SW Contact  Truddie Hidden, RN Phone Number: 10/20/2023, 1:57 PM  Clinical Narrative:    Spoke with patient regarding discharge plan and HH recommendation. He is agreeable to [facility] and was previously active with [facility]. He would like HH via [facility]. His grandson will arrange transportation for him at discharge. Patient does not wish to received a RW.  Referral sent and accepted by Adelina Mings at [facility]         Expected Discharge Plan and Services                                               Social Determinants of Health (SDOH) Interventions SDOH Screenings   Food Insecurity: No Food Insecurity (10/19/2023)  Recent Concern: Food Insecurity - Food Insecurity Present (10/01/2023)  Housing: Low Risk  (10/19/2023)  Recent Concern: Housing - Medium Risk (08/13/2023)  Transportation Needs: No Transportation Needs (10/19/2023)  Utilities: Not At Risk (10/19/2023)  Alcohol Screen: Low Risk  (03/10/2023)  Depression (PHQ2-9): Low Risk  (10/01/2023)  Recent Concern: Depression (PHQ2-9) - Medium Risk (09/03/2023)  Financial Resource Strain: Low Risk  (08/18/2023)   Received from [facility]  Physical Activity: Inactive (03/10/2023)  Social Connections: Moderately Isolated (08/06/2023)  Stress: No Stress Concern Present (03/10/2023)  Tobacco Use: Medium Risk (10/13/2023)  Health Literacy: Adequate Health Literacy (08/06/2023)    Readmission Risk Interventions    09/30/2023   10:28 AM 08/05/2023   10:11 AM 07/13/2023   10:52 AM  Readmission Risk Prevention Plan  Transportation Screening Complete Complete Complete  Medication Review (RN Care Manager) Complete Complete Complete  PCP or Specialist appointment within 3-5 days of discharge Complete Complete Complete  SW Recovery Care/Counseling Consult Complete Complete  Complete  Palliative Care Screening Not Applicable Not Applicable Not Applicable  Skilled Nursing Facility Not Applicable Not Applicable Not Applicable

## 2023-10-20 NOTE — Progress Notes (Signed)
Physical Therapy Treatment Patient Details Name: Lawrence [name] MRN: [id number] DOB: 1956/04/11 Today's Date: 10/20/2023   History of Present Illness Pt is a 67 y.o. male presenting to hospital 10/18/23 with c/o generalized weakness and increased somnolence; also c/o some chest pain and atraumatic L hip pain.  Pt admitted with hypotension, CAD, chest pain (pt with chronic chest pain).  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    PT Comments  Pt received in recliner and agreed to PT session. Pt reported pain to be on a scale of 7/10 located in their low back on the left side, left hip, and left chest pain. Pt performed STS with the use of RW (2wheels) CGA, amb ~161ft with RW CGA, and sit>supine ModI to finish session in bed with NT at bedside. VC necessary throughout session for RW management. Vitals at the beginning of session read: 124/93 mmHg and 70 bpm while seated EOB and at the end of session read 177/119 mmHg and 74 bpm while seated EOB. Pt tolerated Tx well and will continue to benefit from skilled PT sessions to improve activity tolerance and functional mobility to maximize safety/return to PLOF following D/C.    If plan is discharge home, recommend the following: A little help with walking and/or transfers;A little help with bathing/dressing/bathroom;Assistance with cooking/housework;Assist for transportation;Help with stairs or ramp for entrance   Can travel by private vehicle        Equipment Recommendations  Rolling walker (2 wheels)    Recommendations for Other Services       Precautions / Restrictions Precautions Precautions: Fall Precaution Comments: Monitor BP Restrictions Weight Bearing Restrictions: No     Mobility  Bed Mobility Overal bed mobility: Modified Independent              General bed mobility comments: Pt performed bed mobility ModI without any s/sx relative to dizziness.    Transfers Overall transfer level: Needs assistance Equipment used: Rolling walker (2 wheels) Transfers: Sit to/from Stand Sit to Stand: Contact guard assist           General transfer comment: Pt performed STS with the use of RW (2wheels) and reported dizziness when standing.    Ambulation/Gait Ambulation/Gait assistance: Contact guard assist Gait Distance (Feet): 100 Feet Assistive device: Rolling walker (2 wheels) Gait Pattern/deviations: Step-through pattern, Antalgic Gait velocity: decreased     General Gait Details: Pt amb in hallway with the use of RW (2wheels) CGA. Pt reported continued pain during amb and returned back to room with fatigue. VC necessary for RW management.   Stairs             Wheelchair Mobility     Tilt Bed    Modified Rankin (Stroke Patients Only)       Balance Overall balance assessment: Needs assistance Sitting-balance support: No upper extremity supported, Feet supported Sitting balance-Leahy Scale: Normal     Standing balance support: No upper extremity supported Standing balance-Leahy Scale: Good                              Cognition Arousal: Alert Behavior During Therapy: WFL for tasks assessed/performed Overall Cognitive Status: Within Functional Limits for tasks assessed  General Comments: AOx4. Pt pleasant and willing to participate in PT session.        Exercises      General Comments        Pertinent Vitals/Pain Pain Assessment Pain Assessment: 0-10 Pain Score: 7  Pain Location: L chest, L hip, and LBP (chronic) Pain Descriptors / Indicators: Aching, Discomfort, Constant Pain Intervention(s): Monitored during session    Home Living                          Prior Function            PT Goals (current goals can  now be found in the care plan section) Acute Rehab PT Goals Patient Stated Goal: to go home PT Goal Formulation: With patient Time For Goal Achievement: 11/02/23 Potential to Achieve Goals: Good Progress towards PT goals: Progressing toward goals    Frequency    Min 1X/week      PT Plan      Co-evaluation              AM-PAC PT "6 Clicks" Mobility   Outcome Measure  Help needed turning from your back to your side while in a flat bed without using bedrails?: None Help needed moving from lying on your back to sitting on the side of a flat bed without using bedrails?: None Help needed moving to and from a bed to a chair (including a wheelchair)?: A Little Help needed standing up from a chair using your arms (e.g., wheelchair or bedside chair)?: A Little Help needed to walk in hospital room?: A Little Help needed climbing 3-5 steps with a railing? : A Little 6 Click Score: 20    End of Session   Activity Tolerance: Patient tolerated treatment well Patient left: in bed;with nursing/sitter in room;with call bell/phone within reach Nurse Communication: Mobility status PT Visit Diagnosis: Other abnormalities of gait and mobility (R26.89);Muscle weakness (generalized) (M62.81)     Time: 2952-8413 PT Time Calculation (min) (ACUTE ONLY): 20 min  Charges:    $Gait Training: 8-22 mins PT General Charges $$ ACUTE PT VISIT: 1 Visit                     Lawrence [name] SPT, LAT, ATC  Lawrence [name] 10/20/2023, 4:19 PM

## 2023-10-20 NOTE — Patient Outreach (Signed)
Care Management   Visit Note  10/20/2023 Name: LINDSAY GIBEAULT MRN: [id number] DOB: 09/05/1956  Subjective: JAECEON SMETHURST is a 67 y.o. year old male who is a primary care patient of [name], Demetrios Isaacs, MD. The Care Management team was consulted for assistance.      Mr. Winterbottom is currently being evaluated at [facility] for generalized weakness, fatigue and chronic chest pain.  Collaborated with hospital physician regarding Palliative Care evaluation. MD agreeable to placing order.    Goals Addressed             This Visit's Progress    Care Management       Current Barriers:  Care Management support and education needs related to HTN, CHF, CAD, COPD, and DM High Fall Risk Requires additional Care Giver support in the home  Planned Interventions: CAD, HTN, CHF Mr. Barnish was recently evaluated and admitted to [facility] for chest pain on 09/28/23. He was discharged on 09/30/23. Reviewed hospital discharge instructions and medications with Mr. Gagliano and his grandson [name]. He did not discontinue metoprolol, lisinopril and amlodipine as ordered. [name] agreed to remove these medications. Aware that Sherryll Burger should also be discontinued. [name] reports that medication had already been removed from the pill bag. Mr. Marra reported being very weak and dizzy during the outreach. [name] confirmed that he has been extremely weak since returning home yesterday. He initially felt it was due to lack of rest but notes the symptoms have not resolved. He has not been able to get up today without assistance. Mr. Asel also notes constant throbbing chest pain all day today. He has experienced a few episodes of nausea. He denies shortness of breath. Denies headaches or visual changes. Reports checking BP a few times today. One reading was normal. Notes a second reading with a diastolic of 146/102. Agreed to check BP during the call. Reports reading was 160/112. Discussed symptoms and concerns that he  needs to be evaluated in the ER. He has a history of chronic intermittent chest pain, however he reports the symptoms have lasted all day. He is agreeable to this plan. 911 was called with patient on the line. RNCM remained on line until paramedics arrived. Per paramedic team, Mr. Reames appeared very weak, his BP was still elevated. Agreed that he needed further evaluation in the ER. Mr. Furnish was transported to [facility] for further evaluation. Update 10/06/23: Brief outreach today. He was evaluated today at [facility] for chest pain. Denies concerns at time of call but prefers to rest d/t being up for several hours in the ED.  Reminded of pending Cardiology appointment with Mr. Lumbard grandson [name]. Per chart review he is scheduled for evaluation by [facility] Cardiology on 10/07/23. Collaborated with clinic staff. Confirmed appointment was scheduled for the [place] location in [place].  Reviewed indications for seeking immediate medical attention with Mr. Teschendorf grandson [name]. Outreach with RNCM scheduled for 10/07/23 at 4:30 Update 10/07/23: Mr. Zanin was doing well today. [name] reports he has not complained of chest discomfort since the ER visit on yesterday. Denies falls or complaints of dizziness or lightheadedness.  Mr. Brackens did not attend the cardiology appointment in [facility] as scheduled. The [facility] Cardiology team will reschedule. Reviewed pending appointments. He is scheduled for outreach with his PCP on 10/11/23. Reviewed worsening s/sx and indications for seeking immediate medical attention. Update 10/20/23: Mr. Wigginton is currently being evaluated at [facility] for generalized weakness, fatigue and chronic chest pain. Collaborated  with hospital physician regarding Palliative Care evaluation. MD agreeable to placing order. The care management team will continue to follow after discharge.     Self-Care Activities/Patient Goals: Take all  medications as prescribed Keep scheduled appointments Monitor BP and record readings Monitor weight and record readings Notify provider for weight gain outside of established parameters Limit sodium intake Follow recommended safety and fall prevention measures Monitor symptoms and seek immediate medical attention if symptoms worsen           PLAN Will follow up after discharge   [facility]  [facility] RN Care Manager Direct Dial: [phone] Website: [email or web address]

## 2023-10-20 NOTE — Progress Notes (Signed)
Progress Note    Lawrence [name]  YTK:[id number] DOB: 10-16-1956  DOA: 10/18/2023 PCP: Malva Limes, MD      Brief Narrative:    Medical records reviewed and are as summarized below:  Lawrence [name] is a 67 y.o. male  medical history significant for paroxysmal atrial fibrillation, bradycardia, anxiety, osteoarthritis, asthma, coronary artery disease, chronic chest pain, celiac disease, COPD, chronic hypoxemic respiratory failure, chronic low back pain, GERD, hypertension,, coronary artery disease s/p coronary stent, chronic diastolic and systolic CHF (echo in September 2024 showed EF estimated at 30 to 35%, grade 2 diastolic dysfunction), repeat echo in October 2024 showed EF estimated at 55 to 60%, recurrent hospitalization for hypotension.  He presented to the hospital because of general weakness, fatigue and chest pain.  He was found to have hypotension.      Assessment/Plan:   Active Problems:   Hypotension   CAD (coronary artery disease)   Chest pain   Chronic combined systolic (congestive) and diastolic (congestive) heart failure (HCC)   HLD (hyperlipidemia)   Diabetes mellitus without complication (HCC)   Chronic respiratory failure with hypoxia (HCC)   Essential hypertension   COPD (chronic obstructive pulmonary disease) (HCC)   Left hip pain   Generalized weakness   Depression with anxiety   Overweight (BMI 25.0-29.9)   Atrial fibrillation, chronic (HCC)    Body mass index is 28.82 kg/m.   Hypotension: Probably from antihypertensives.  He has been admitted to the hospital multiple times for hypotension.  Plan to discontinue spironolactone and decrease Isosorbide mononitrate from 120 mg daily to 60 mg daily.  Patient agreeable with this plan.  Monitor BP overnight.   Paroxysmal atrial fibrillation, bradycardia: Restart carvedilol and monitor heart rate overnight.  Patient said sometimes heart rate goes into the 150s when he does not take  carvedilol.   CAD s/p CABG and coronary stent, chronic chest pain: Continue Eliquis   History of chronic systolic and diastolic CHF: Stable.  Most recent 2D echo in October 2024 showed recovered EF estimated at 55 to 60%.   COPD with chronic hypoxemic respiratory failure: Continue bronchodilators and 2 L/min oxygen via nasal cannula.   General weakness: PT recommended home health therapy   Comorbidities include hyperlipidemia, type II DM, chronic left hip pain and low back pain, depression, anxiety   Patient has multiple comorbidities with recurrent admissions.  Consult palliative care team.   Diet Order             Diet heart healthy/carb modified Room service appropriate? Yes; Fluid consistency: Thin  Diet effective now                            Consultants: None  Procedures: None    Medications:    apixaban  5 mg Oral BID   atorvastatin  80 mg Oral QHS   cyanocobalamin  1,000 mcg Oral Daily   dapagliflozin propanediol  10 mg Oral Daily   insulin aspart  0-5 Units Subcutaneous QHS   insulin aspart  0-9 Units Subcutaneous TID WC   loratadine  10 mg Oral Daily   mometasone-formoterol  2 puff Inhalation BID   pantoprazole  40 mg Oral BID   traZODone  100 mg Oral QHS   umeclidinium bromide  1 puff Inhalation Daily   venlafaxine XR  75 mg Oral Q breakfast   Continuous Infusions:   Anti-infectives (From admission, onward)  None              Family Communication/Anticipated D/C date and plan/Code Status   DVT prophylaxis:  apixaban (ELIQUIS) tablet 5 mg     Code Status: Limited: Do not attempt resuscitation (DNR) -DNR-LIMITED -Do Not Intubate/DNI   Family Communication: None Disposition Plan: Plan to discharge home tomorrow   Status is: Observation The patient will require care spanning > 2 midnights and should be moved to inpatient because: Hypotension, bradycardia       Subjective:   Interval events noted.  He  complains of dizziness, general weakness, back pain and chest pain which is chronic.  Objective:    Vitals:   10/19/23 2357 10/20/23 0413 10/20/23 0752 10/20/23 0959  BP: 121/76 122/78 136/69   Pulse: 100 76 66   Resp: 18 18  18   Temp: 97.6 F (36.4 C) 97.6 F (36.4 C) (!) 97.5 F (36.4 C)   TempSrc: Oral Oral    SpO2: 96% 95% 100%   Weight:      Height:       No data found.   Intake/Output Summary (Last 24 hours) at 10/20/2023 1007 Last data filed at 10/19/2023 2100 Gross per 24 hour  Intake 476 ml  Output 1125 ml  Net -649 ml   Filed Weights   10/18/23 1429  Weight: 83.5 kg    Exam:  GEN: NAD SKIN: Warm and dry EYES: No pallor or icterus ENT: MMM CV: RRR PULM: CTA B ABD: soft, ND, NT, +BS CNS: AAO x 3, non focal EXT: No edema or tenderness        Data Reviewed:   I have personally reviewed following labs and imaging studies:  Labs: Labs show the following:   Basic Metabolic Panel: Recent Labs  Lab 10/18/23 1549 10/19/23 0445 10/20/23 0530  NA 140 137 139  K 4.6 4.4 4.7  CL 105 106 103  CO2 29 26 31   GLUCOSE 133* 104* 123*  BUN 12 16 16   CREATININE 1.07 1.03 1.15  CALCIUM 8.6* 8.7* 9.2   GFR Estimated Creatinine Clearance: 62.7 mL/min (by C-G formula based on SCr of 1.15 mg/dL). Liver Function Tests: Recent Labs  Lab 10/18/23 1549  AST 18  ALT 14  ALKPHOS 44  BILITOT 1.1  PROT 5.5*  ALBUMIN 3.1*   No results for input(s): "LIPASE", "AMYLASE" in the last 168 hours. No results for input(s): "AMMONIA" in the last 168 hours. Coagulation profile No results for input(s): "INR", "PROTIME" in the last 168 hours.  CBC: Recent Labs  Lab 10/18/23 1549 10/19/23 0445 10/20/23 0530  WBC 8.6 6.5 5.9  NEUTROABS 6.6  --   --   HGB 16.1 14.9 15.0  HCT 50.6 45.5 47.1  MCV 93.2 91.0 91.5  PLT 151 131* 130*   Cardiac Enzymes: No results for input(s): "CKTOTAL", "CKMB", "CKMBINDEX", "TROPONINI" in the last 168 hours. BNP (last 3  results) No results for input(s): "PROBNP" in the last 8760 hours. CBG: Recent Labs  Lab 10/19/23 0747 10/19/23 1203 10/19/23 1741 10/19/23 2129 10/20/23 0753  GLUCAP 94 86 114* 161* 110*   D-Dimer: No results for input(s): "DDIMER" in the last 72 hours. Hgb A1c: Recent Labs    10/18/23 1432  HGBA1C 6.2*   Lipid Profile: Recent Labs    10/19/23 0445  CHOL 140  HDL 29*  LDLCALC 80  TRIG 161*  CHOLHDL 4.8   Thyroid function studies: No results for input(s): "TSH", "T4TOTAL", "T3FREE", "THYROIDAB" in the last  72 hours.  Invalid input(s): "FREET3" Anemia work up: No results for input(s): "VITAMINB12", "FOLATE", "FERRITIN", "TIBC", "IRON", "RETICCTPCT" in the last 72 hours. Sepsis Labs: Recent Labs  Lab 10/18/23 1549 10/19/23 0445 10/20/23 0530  WBC 8.6 6.5 5.9    Microbiology Recent Results (from the past 240 hour(s))  Resp panel by RT-PCR (RSV, Flu A&B, Covid) Anterior Nasal Swab     Status: None   Collection Time: 10/18/23  3:46 PM   Specimen: Anterior Nasal Swab  Result Value Ref Range Status   SARS Coronavirus 2 by RT PCR NEGATIVE NEGATIVE Final    Comment: (NOTE) SARS-CoV-2 target nucleic acids are NOT DETECTED.  The SARS-CoV-2 RNA is generally detectable in upper respiratory specimens during the acute phase of infection. The lowest concentration of SARS-CoV-2 viral copies this assay can detect is 138 copies/mL. A negative result does not preclude SARS-Cov-2 infection and should not be used as the sole basis for treatment or other patient management decisions. A negative result may occur with  improper specimen collection/handling, submission of specimen other than nasopharyngeal swab, presence of viral mutation(s) within the areas targeted by this assay, and inadequate number of viral copies(<138 copies/mL). A negative result must be combined with clinical observations, patient history, and epidemiological information. The expected result is  Negative.  Fact Sheet for Patients:  BloggerCourse.com  Fact Sheet for Healthcare Providers:  SeriousBroker.it  This test is no t yet approved or cleared by the [place] FDA and  has been authorized for detection and/or diagnosis of SARS-CoV-2 by FDA under an Emergency Use Authorization (EUA). This EUA will remain  in effect (meaning this test can be used) for the duration of the COVID-19 declaration under Section 564(b)(1) of the Act, 21 U.S.C.section 360bbb-3(b)(1), unless the authorization is terminated  or revoked sooner.       Influenza A by PCR NEGATIVE NEGATIVE Final   Influenza B by PCR NEGATIVE NEGATIVE Final    Comment: (NOTE) The Xpert Xpress SARS-CoV-2/FLU/RSV plus assay is intended as an aid in the diagnosis of influenza from Nasopharyngeal swab specimens and should not be used as a sole basis for treatment. Nasal washings and aspirates are unacceptable for Xpert Xpress SARS-CoV-2/FLU/RSV testing.  Fact Sheet for Patients: BloggerCourse.com  Fact Sheet for Healthcare Providers: SeriousBroker.it  This test is not yet approved or cleared by the [place] FDA and has been authorized for detection and/or diagnosis of SARS-CoV-2 by FDA under an Emergency Use Authorization (EUA). This EUA will remain in effect (meaning this test can be used) for the duration of the COVID-19 declaration under Section 564(b)(1) of the Act, 21 U.S.C. section 360bbb-3(b)(1), unless the authorization is terminated or revoked.     Resp Syncytial Virus by PCR NEGATIVE NEGATIVE Final    Comment: (NOTE) Fact Sheet for Patients: BloggerCourse.com  Fact Sheet for Healthcare Providers: SeriousBroker.it  This test is not yet approved or cleared by the [place] FDA and has been authorized for detection and/or diagnosis of  SARS-CoV-2 by FDA under an Emergency Use Authorization (EUA). This EUA will remain in effect (meaning this test can be used) for the duration of the COVID-19 declaration under Section 564(b)(1) of the Act, 21 U.S.C. section 360bbb-3(b)(1), unless the authorization is terminated or revoked.  Performed at [facility], [address] [place], [place] [address]     Procedures and diagnostic studies:  DG Chest [facility] 1 View  Result Date: 10/18/2023 CLINICAL DATA:  Chest pain. EXAM: PORTABLE CHEST 1 VIEW COMPARISON:  Chest radiograph dated 10/01/2023. FINDINGS: No focal consolidation, pleural effusion, pneumothorax. Stable cardiac silhouette. Median sternotomy wires and CABG vascular clips. Atherosclerotic calcification of the aorta. No acute osseous pathology. IMPRESSION: No active disease. Electronically Signed   By: Elgie Collard M.D.   On: 10/18/2023 23:18               LOS: 0 days   Cortez Flippen  [facility]   Pager on [email or web address]. If 7PM-7AM, please contact night-coverage at [email or web address]     10/20/2023, 10:07 AM

## 2023-10-21 ENCOUNTER — Encounter: Payer: Medicare HMO | Admitting: Family

## 2023-10-21 DIAGNOSIS — Z515 Encounter for palliative care: Secondary | ICD-10-CM

## 2023-10-21 DIAGNOSIS — I959 Hypotension, unspecified: Secondary | ICD-10-CM | POA: Diagnosis not present

## 2023-10-21 DIAGNOSIS — R531 Weakness: Secondary | ICD-10-CM

## 2023-10-21 DIAGNOSIS — I952 Hypotension due to drugs: Secondary | ICD-10-CM | POA: Diagnosis not present

## 2023-10-21 LAB — GLUCOSE, CAPILLARY
Glucose-Capillary: 153 mg/dL — ABNORMAL HIGH (ref 70–99)
Glucose-Capillary: 104 mg/dL — ABNORMAL HIGH (ref 70–99)

## 2023-10-21 MED ORDER — NITROGLYCERIN 0.4 MG SL SUBL: 0.4000 mg | SUBLINGUAL_TABLET | SUBLINGUAL | Status: AC | PRN

## 2023-10-21 MED ORDER — ISOSORBIDE MONONITRATE ER 60 MG PO TB24: 60.0000 mg | ORAL_TABLET | Freq: Every day | ORAL | 0 refills | Status: DC

## 2023-10-21 NOTE — Discharge Summary (Addendum)
Physician Discharge Summary   Patient: Lawrence [name] MRN: [id number] DOB: 10-Sep-1956  Admit date:     10/18/2023  Discharge date: 10/22/23  Discharge Physician: Lurene Shadow   PCP: Malva Limes, MD   Recommendations at discharge:   Follow-up with PCP in 1 week Follow-up with Dr. Juliann Pares, cardiologist, in 1 week Follow-up at the [facility]F clinic on 10/27/2023  Discharge Diagnoses: Principal Problem:   Hypotension Active Problems:   CAD (coronary artery disease)   Chest pain   Chronic combined systolic (congestive) and diastolic (congestive) heart failure (HCC)   HLD (hyperlipidemia)   Diabetes mellitus without complication (HCC)   Chronic respiratory failure with hypoxia (HCC)   Essential hypertension   COPD (chronic obstructive pulmonary disease) (HCC)   Left hip pain   Generalized weakness   Depression with anxiety   Overweight (BMI 25.0-29.9)   Atrial fibrillation, chronic (HCC)  Resolved Problems:   * No resolved hospital problems. *  Hospital Course:  Lawrence [name] is a 67 y.o. male  medical history significant for paroxysmal atrial fibrillation, bradycardia, anxiety, osteoarthritis, asthma, coronary artery disease, chronic chest pain, celiac disease, COPD, chronic hypoxemic respiratory failure, chronic low back pain, GERD, hypertension,, coronary artery disease s/p coronary stent, chronic diastolic and systolic CHF (echo in September 2024 showed EF estimated at 30 to 35%, grade 2 diastolic dysfunction), repeat echo in October 2024 showed EF estimated at 55 to 60%, recurrent hospitalization for hypotension.  He presented to the hospital because of general weakness, fatigue and chest pain.   He was found to have hypotension.     Assessment and Plan:   Hypotension: Probably from antihypertensives. BP is better.  He has been admitted to the hospital multiple times for hypotension.  Discontinue spironolactone.  Decrease isosorbide mononitrate from 120 mg daily to 60  mg daily.  Patient agreeable with this plan.       Paroxysmal atrial fibrillation, bradycardia: Heart rate is stable.  Continue  low dose Carvedilol. Patient said sometimes heart rate goes into the 150s when he does not take carvedilol.     CAD s/p CABG and coronary stent, chronic chest pain: Continue Eliquis     History of chronic systolic and diastolic CHF: Stable.  Most recent 2D echo in October 2024 showed recovered EF estimated at 55 to 60%.     COPD with chronic hypoxemic respiratory failure: Continue bronchodilators and 2 L/min oxygen via nasal cannula.     General weakness: PT recommended home health therapy     Comorbidities include hyperlipidemia, type II DM, chronic left hip pain and low back pain, depression, anxiety     His condition has improved and is deemed stable for discharge. Patient has multiple comorbidities with recurrent admissions.  Palliative care team was consulted but he could not be seen prior to discharge.  He can follow-up with palliative care as an outpatient.  PCP can set this up.           Consultants: None Procedures performed: None  Disposition: Home Diet recommendation:  Discharge Diet Orders (From admission, onward)     Start     Ordered   10/21/23 0000  Diet - low sodium heart healthy        10/21/23 0956   10/21/23 0000  Diet Carb Modified        10/21/23 0956           Cardiac and Carb modified diet DISCHARGE MEDICATION: Allergies as of 10/21/2023  Reactions   Prednisone Other (See Comments), Hypertension   Pt states that this medication puts him in A-fib    Demerol  [meperidine Hcl]    Demerol [meperidine] Hives   Sulfa Antibiotics Hives   Albuterol Sulfate [albuterol] Palpitations, Other (See Comments)   Pt currently uses this medication.     Empagliflozin Other (See Comments), Hives   Perineal pain Other Reaction(s): Other (See Comments)   Morphine Sulfate Nausea And Vomiting, Rash, Other (See Comments)   Pt  states that he is only allergic to the tablet form of this medication.          Medication List     STOP taking these medications    dicyclomine 10 MG capsule Commonly known as: BENTYL   spironolactone 25 MG tablet Commonly known as: ALDACTONE       TAKE these medications    Accu-Chek Guide Control Liqd Use with blood glucose monitor as directed   Accu-Chek Guide test strip Generic drug: glucose blood Use as instructed to check sugar daily for type 2 diabetes.   Accu-Chek Guide w/Device Kit Use to check blood sugars as directed   Accu-Chek Softclix Lancets lancets Use as instructed to check sugar daily for type 2 diabetes.   ALPRAZolam 1 MG tablet Commonly known as: XANAX Take 1 tablet (1 mg total) by mouth 3 (three) times daily as needed.   apixaban 5 MG Tabs tablet Commonly known as: Eliquis Take 1 tablet (5 mg total) by mouth 2 (two) times daily.   atorvastatin 80 MG tablet Commonly known as: LIPITOR TAKE 1 TABLET BY MOUTH AT BEDTIME   Breztri Aerosphere 160-9-4.8 MCG/ACT Aero Generic drug: Budeson-Glycopyrrol-Formoterol Inhale 2 puffs into the lungs 2 (two) times daily.   carvedilol 3.125 MG tablet Commonly known as: COREG Take 3.125 mg by mouth 2 (two) times daily with a meal.   cetirizine 10 MG tablet Commonly known as: ZYRTEC TAKE 1 TABLET BY MOUTH AT BEDTIME   Farxiga 10 MG Tabs tablet Generic drug: dapagliflozin propanediol Take 10 mg by mouth daily.   Ferrex 150 150 MG capsule Generic drug: iron polysaccharides TAKE 1 CAPSULE BY MOUTH DAILY   isosorbide mononitrate 60 MG 24 hr tablet Commonly known as: IMDUR Take 1 tablet (60 mg total) by mouth daily. What changed:  medication strength how much to take   lidocaine 5 % Commonly known as: LIDODERM Place 1 patch onto the skin daily. Remove & Discard patch within 12 hours or as directed by MD   metFORMIN 500 MG 24 hr tablet Commonly known as: GLUCOPHAGE-XR Take 1 tablet (500 mg  total) by mouth 2 (two) times daily. What changed: when to take this   methocarbamol 500 MG tablet Commonly known as: ROBAXIN TAKE 1 TABLET BY MOUTH EVERY 8 HOURS AS NEEDED FOR MUSCLE SPASMS   nitroGLYCERIN 0.4 MG SL tablet Commonly known as: NITROSTAT Place 1 tablet (0.4 mg total) under the tongue every 5 (five) minutes as needed for chest pain (Do not take if blood pressure is low, systolic BP<110). What changed: reasons to take this   omega-3 acid ethyl esters 1 g capsule Commonly known as: LOVAZA TAKE 4 CAPSULES (4 GRAMS TOTAL) BY MOUTHDAILY   oxyCODONE-acetaminophen 10-325 MG tablet Commonly known as: PERCOCET TAKE 1 TABLET BY MOUTH EVERY 4 HOURS AS NEEDED FOR PAIN.   OXYGEN Inhale 2 L into the lungs at bedtime as needed (for shortness of breath).   pantoprazole 40 MG tablet Commonly known as: PROTONIX Take 1  tablet (40 mg total) by mouth 2 (two) times daily for 14 days.   ranolazine 1000 MG SR tablet Commonly known as: RANEXA Take 1 tablet (1,000 mg total) by mouth 2 (two) times daily.   traZODone 100 MG tablet Commonly known as: DESYREL Take 1 tablet (100 mg total) by mouth at bedtime.   venlafaxine XR 75 MG 24 hr capsule Commonly known as: EFFEXOR-XR Take 75 mg by mouth daily with breakfast.   Vitamin B-12 1000 MCG Subl Place 1 tablet under the tongue daily.        Follow-up Information     [facility]. Go in 7 day(s).   Specialty: Cardiology Why: Hospital Follow-Up-10/27/23 @ 3:30 Please bring all meds to follow-up appointment [facility], [address] Contact information: [address] [place] [address] [phone]        Alwyn Pea, MD. Schedule an appointment as soon as possible for a visit in 1 week(s).   Specialties: Cardiology, Internal Medicine Contact information: [address] [place] [address] [phone]                 Discharge Exam: Ceasar Mons Weights   10/18/23 1429  Weight: 83.5 kg   GEN: NAD SKIN: Warm and dry EYES: No pallor or icterus ENT: MMM CV: Irregular rate and rhythm PULM: CTA B ABD: soft, ND, NT, +BS CNS: AAO x 3, non focal EXT: No edema or tenderness   Condition at discharge: good  The results of significant diagnostics from this hospitalization (including imaging, microbiology, ancillary and laboratory) are listed below for reference.   Imaging Studies: DG Chest Port 1 View  Result Date: 10/18/2023 CLINICAL DATA:  Chest pain. EXAM: PORTABLE CHEST 1 VIEW COMPARISON:  Chest radiograph dated 10/01/2023. FINDINGS: No focal consolidation, pleural effusion, pneumothorax. Stable cardiac silhouette. Median sternotomy wires and CABG vascular clips. Atherosclerotic calcification of the aorta. No acute osseous pathology. IMPRESSION: No active disease. Electronically Signed   By: Elgie Collard M.D.   On: 10/18/2023 23:18   CT Angio Chest/Abd/Pel for Dissection W and/or W/WO  Result Date: 10/02/2023 CLINICAL DATA:  Aortic aneurysm suspected EXAM: CT ANGIOGRAPHY CHEST, ABDOMEN AND PELVIS TECHNIQUE: Non-contrast CT of the chest was initially obtained. Multidetector CT imaging through the chest, abdomen and pelvis was performed using the standard protocol during bolus administration of intravenous contrast. Multiplanar reconstructed images and MIPs were obtained and reviewed to evaluate the vascular anatomy. RADIATION DOSE REDUCTION: This exam was performed according to the departmental dose-optimization program which includes automated exposure control, adjustment of the mA and/or kV according to patient size and/or use of iterative reconstruction technique. CONTRAST:  OMNIPAQUE IOHEXOL 350 MG/ML SOLN COMPARISON:  CTA CAP, 06/08/2023 and 05/06/2023. FINDINGS: CTA CHEST FINDINGS Cardiovascular: Preferential opacification of the thoracic aorta. No evidence of acute thoracic aortic aneurysm  or dissection. 3.6 cm ascending thoracic aorta. No segmental or larger pulmonary embolus. Biventricular cardiac enlargement. No pericardial effusion. Moderate burden of calcified coronary atherosclerosis, greatest within the LAD. Mediastinum/Nodes: No enlarged mediastinal, hilar, or axillary lymph nodes. Thyroid gland, trachea, and esophagus demonstrate no significant findings. Lungs/Pleura: Apically-predominant mild centrilobular emphysematous lung change. 8 mm middle lobe pulmonary nodule. Bibasilar dependent opacities without discrete consolidation. No pleural effusion or pneumothorax. Musculoskeletal: Median sternotomy change. Increased AP thoracic diameter. No acute chest wall abnormality. Review of the MIP images confirms the above findings. CTA ABDOMEN AND PELVIS FINDINGS VASCULAR Aorta: Moderate burden of atherosclerosis.  Normal caliber aorta without aneurysm, dissection, vasculitis or significant stenosis. Celiac: Patent without evidence of aneurysm, dissection, vasculitis or significant stenosis. SMA: Patent without evidence of aneurysm, dissection, vasculitis or significant stenosis. Renals: Single renal arteries are present. Both renal arteries are patent without evidence of aneurysm, dissection, vasculitis, fibromuscular dysplasia or significant stenosis. IMA: Patent without evidence of aneurysm, dissection, vasculitis or significant stenosis. Pelvis: Patent without evidence of aneurysm, dissection, vasculitis or significant stenosis. Veins: No obvious venous abnormality within the limitations of this arterial phase study. Review of the MIP images confirms the above findings. NON-VASCULAR Hepatobiliary: No focal liver abnormality is seen. No gallstones, gallbladder wall thickening, or biliary dilatation. Pancreas: No pancreatic ductal dilatation or surrounding inflammatory changes. Spleen: Normal in size without focal abnormality. Adrenals/Urinary Tract: Adrenal glands are unremarkable. Kidneys are  normal, without renal calculi, focal lesion, or hydronephrosis. Bladder is unremarkable. Stomach/Bowel: Layering fluid within the mid-to-distal esophagus. Small hiatus hernia. Stomach is nondistended. Appendix appears normal. Nonobstructed small bowel. Nondilated colon. Sigmoid diverticulosis. No evidence of bowel wall thickening, distention, or inflammatory changes. Lymphatic: No enlarged abdominal or pelvic lymph nodes. Reproductive: Prostate is unremarkable. Scrotal tissue thickening greater on LEFT. Testicular calcifications Other: Small fat-containing LEFT inguinal hernia versus cord lipoma. No abdominal wall hernia or abnormality. No abdominopelvic ascites. Musculoskeletal: No acute or significant osseous findings. Review of the MIP images confirms the above findings. IMPRESSION: 1. No acute vascular or nonvascular abnormality within the chest, abdomen or pelvis. 2. Background of mild centrilobular emphysema with a solid 8 mm middle lobe pulmonary nodule. Per Fleischner Society Guidelines, recommend a non-contrast Chest CT at 6-12 months. 3. Asymmetric thickening of the LEFT scrotum, incompletely imaged/assessed on this study. Correlate with examination 4. Aortic Atherosclerosis (ICD10-I70.0) and Emphysema (ICD10-J43.9), small hiatus hernia and sigmoid diverticulosis. Additional incidental, chronic and senescent findings as above. Electronically Signed   By: Roanna Banning M.D.   On: 10/02/2023 12:12   DG Chest 2 View  Result Date: 10/01/2023 CLINICAL DATA:  Shortness of breath. EXAM: CHEST - 2 VIEW COMPARISON:  Chest radiograph dated 09/28/2023. FINDINGS: Minimal bibasilar atelectasis. No focal consolidation, pleural effusion, pneumothorax. Mild cardiomegaly. Median sternotomy wires and CABG vascular clips. No acute osseous pathology. IMPRESSION: 1. No active cardiopulmonary disease. 2. Mild cardiomegaly. Electronically Signed   By: Elgie Collard M.D.   On: 10/01/2023 20:00   DG Chest Portable 1  View  Result Date: 09/29/2023 CLINICAL DATA:  Status post fall. EXAM: PORTABLE CHEST 1 VIEW COMPARISON:  September 10, 2023 FINDINGS: Multiple sternal wires and vascular clips are seen. The heart size and mediastinal contours are within normal limits. A coronary artery stent is in place. Both lungs are clear. Multilevel degenerative changes seen throughout the thoracic spine. IMPRESSION: 1. Evidence of prior median sternotomy/CABG. 2. No acute cardiopulmonary disease. Electronically Signed   By: Aram Candela M.D.   On: 09/29/2023 00:19   DG Pelvis Portable  Result Date: 09/29/2023 CLINICAL DATA:  Status post fall. EXAM: PORTABLE PELVIS 1-2 VIEWS COMPARISON:  June 15, 2023 FINDINGS: There is no evidence of pelvic fracture or diastasis. No pelvic bone lesions are seen. Moderate to marked severity vascular calcification is noted. IMPRESSION: No acute osseous abnormality. Electronically Signed   By: Aram Candela M.D.   On: 09/29/2023 00:17   CT Hip Left Wo Contrast  Result Date: 09/24/2023 CLINICAL DATA:  Left hip pain after a fall. EXAM: CT OF THE LEFT HIP WITHOUT CONTRAST TECHNIQUE: Multidetector CT imaging of the left hip was performed according  to the standard protocol. Multiplanar CT image reconstructions were also generated. RADIATION DOSE REDUCTION: This exam was performed according to the departmental dose-optimization program which includes automated exposure control, adjustment of the mA and/or kV according to patient size and/or use of iterative reconstruction technique. COMPARISON:  X-ray from earlier same day FINDINGS: Bones/Joint/Cartilage No left femoral neck fracture. Left acetabulum is intact. The symphysis pubis and left SI joint are unremarkable. No fracture within the visualized left iliac bone. No evidence for acute fracture within the visualized portion of the left sacrum. Ligaments Suboptimally assessed by CT. Muscles and Tendons No evidence for muscle abnormality in the  visualized anatomy around the left hip. Soft tissues No evidence for intramuscular or deep soft tissue hematoma in or about the left hip. No free fluid identified within the visualized peritoneal cavity. No substantial subcutaneous contusion. IMPRESSION: No acute bony or soft tissue findings to explain the patient's history of pain. Electronically Signed   By: Kennith Center M.D.   On: 09/24/2023 05:07   CT Cervical Spine Wo Contrast  Result Date: 09/24/2023 CLINICAL DATA:  67 year old male status post syncope, fall. EXAM: CT CERVICAL SPINE WITHOUT CONTRAST TECHNIQUE: Multidetector CT imaging of the cervical spine was performed without intravenous contrast. Multiplanar CT image reconstructions were also generated. RADIATION DOSE REDUCTION: This exam was performed according to the departmental dose-optimization program which includes automated exposure control, adjustment of the mA and/or kV according to patient size and/or use of iterative reconstruction technique. COMPARISON:  Head CT reported separately today. Previous cervical spine CT 06/15/2023. FINDINGS: Alignment: Straightening but slightly improved cervical lordosis compared to July. Cervicothoracic junction alignment is within normal limits. Bilateral posterior element alignment is within normal limits. Skull base and vertebrae: Visualized skull base is intact. No atlanto-occipital dissociation. C1 and C2 appear intact and aligned. Stable visualized osseous structures. A circumscribed lucent area in the left C3 body is stable since 12/26/2022 and most likely benign. Some underlying generalized osteopenia. Soft tissues and spinal canal: No prevertebral fluid or swelling. No visible canal hematoma. Chronic calcified cervical carotid atherosclerosis, partially retropharyngeal course of the right carotid. Disc levels: Mild for age cervical spine degeneration appears stable. Upper chest: Partially visible sternotomy. Visible upper thoracic levels appear  intact. Upper lobe emphysema. Calcified aortic atherosclerosis. Other: Absent dentition. Visualized paranasal sinuses and mastoids are stable and well aerated. IMPRESSION: 1. No acute traumatic injury identified in the cervical spine. 2. Stable osteopenia and mild for age cervical spine degeneration. 3.  Emphysema (ICD10-J43.9). Electronically Signed   By: Odessa Fleming M.D.   On: 09/24/2023 04:38   CT HEAD WO CONTRAST ( )  Result Date: 09/24/2023 CLINICAL DATA:  Head trauma EXAM: CT HEAD WITHOUT CONTRAST TECHNIQUE: Contiguous axial images were obtained from the base of the skull through the vertex without intravenous contrast. RADIATION DOSE REDUCTION: This exam was performed according to the departmental dose-optimization program which includes automated exposure control, adjustment of the mA and/or kV according to patient size and/or use of iterative reconstruction technique. COMPARISON:  None Available. FINDINGS: Brain: There is no mass, hemorrhage or extra-axial collection. There is hypoattenuation of the supratentorial white matter. CSF spaces are normal. Vascular: No hyperdense vessel or unexpected calcification. Skull: Normal. Negative for fracture or focal lesion. Sinuses/Orbits: No acute finding. Other: None. IMPRESSION: 1. No acute intracranial abnormality. 2. Findings of chronic small vessel ischemia. Electronically Signed   By: Deatra Robinson M.D.   On: 09/24/2023 01:16   DG Hip Unilat W or Wo Pelvis 2-3 Views  Left  Result Date: 09/24/2023 CLINICAL DATA:  Left hip pain after fall. EXAM: DG HIP (WITH OR WITHOUT PELVIS) 2-3V LEFT COMPARISON:  None Available. FINDINGS: No acute fracture of the pelvis or left hip. Femoral head is well seated, no hip dislocation. Pubic rami are intact. No pubic symphyseal or sacroiliac diastasis. Mild bilateral hip degenerative change. There are prominent vascular calcifications. IMPRESSION: 1. No acute fracture or subluxation of the pelvis or left hip. 2. Mild bilateral  hip degenerative change. Electronically Signed   By: Narda Rutherford M.D.   On: 09/24/2023 01:13    Microbiology: Results for orders placed or performed during the hospital encounter of 10/18/23  Resp panel by RT-PCR (RSV, Flu A&B, Covid) Anterior Nasal Swab     Status: None   Collection Time: 10/18/23  3:46 PM   Specimen: Anterior Nasal Swab  Result Value Ref Range Status   SARS Coronavirus 2 by RT PCR NEGATIVE NEGATIVE Final    Comment: (NOTE) SARS-CoV-2 target nucleic acids are NOT DETECTED.  The SARS-CoV-2 RNA is generally detectable in upper respiratory specimens during the acute phase of infection. The lowest concentration of SARS-CoV-2 viral copies this assay can detect is 138 copies/mL. A negative result does not preclude SARS-Cov-2 infection and should not be used as the sole basis for treatment or other patient management decisions. A negative result may occur with  improper specimen collection/handling, submission of specimen other than nasopharyngeal swab, presence of viral mutation(s) within the areas targeted by this assay, and inadequate number of viral copies(<138 copies/mL). A negative result must be combined with clinical observations, patient history, and epidemiological information. The expected result is Negative.  Fact Sheet for Patients:  BloggerCourse.com  Fact Sheet for Healthcare Providers:  SeriousBroker.it  This test is no t yet approved or cleared by the [place] FDA and  has been authorized for detection and/or diagnosis of SARS-CoV-2 by FDA under an Emergency Use Authorization (EUA). This EUA will remain  in effect (meaning this test can be used) for the duration of the COVID-19 declaration under Section 564(b)(1) of the Act, 21 U.S.C.section 360bbb-3(b)(1), unless the authorization is terminated  or revoked sooner.       Influenza A by PCR NEGATIVE NEGATIVE Final   Influenza B by PCR  NEGATIVE NEGATIVE Final    Comment: (NOTE) The Xpert Xpress SARS-CoV-2/FLU/RSV plus assay is intended as an aid in the diagnosis of influenza from Nasopharyngeal swab specimens and should not be used as a sole basis for treatment. Nasal washings and aspirates are unacceptable for Xpert Xpress SARS-CoV-2/FLU/RSV testing.  Fact Sheet for Patients: BloggerCourse.com  Fact Sheet for Healthcare Providers: SeriousBroker.it  This test is not yet approved or cleared by the [place] FDA and has been authorized for detection and/or diagnosis of SARS-CoV-2 by FDA under an Emergency Use Authorization (EUA). This EUA will remain in effect (meaning this test can be used) for the duration of the COVID-19 declaration under Section 564(b)(1) of the Act, 21 U.S.C. section 360bbb-3(b)(1), unless the authorization is terminated or revoked.     Resp Syncytial Virus by PCR NEGATIVE NEGATIVE Final    Comment: (NOTE) Fact Sheet for Patients: BloggerCourse.com  Fact Sheet for Healthcare Providers: SeriousBroker.it  This test is not yet approved or cleared by the [place] FDA and has been authorized for detection and/or diagnosis of SARS-CoV-2 by FDA under an Emergency Use Authorization (EUA). This EUA will remain in effect (meaning this test can be used) for the duration of the  COVID-19 declaration under Section 564(b)(1) of the Act, 21 U.S.C. section 360bbb-3(b)(1), unless the authorization is terminated or revoked.  Performed at [facility], [address] [place], [place] [address]    *Note: Due to a large number of results and/or encounters for the requested time period, some results have not been displayed. A complete set of results can be found in Results Review.    Labs: CBC: Recent Labs  Lab 10/18/23 1549 10/19/23 0445 10/20/23 0530  WBC 8.6 6.5 5.9  NEUTROABS  6.6  --   --   HGB 16.1 14.9 15.0  HCT 50.6 45.5 47.1  MCV 93.2 91.0 91.5  PLT 151 131* 130*   Basic Metabolic Panel: Recent Labs  Lab 10/18/23 1549 10/19/23 0445 10/20/23 0530  NA 140 137 139  K 4.6 4.4 4.7  CL 105 106 103  CO2 29 26 31   GLUCOSE 133* 104* 123*  BUN 12 16 16   CREATININE 1.07 1.03 1.15  CALCIUM 8.6* 8.7* 9.2   Liver Function Tests: Recent Labs  Lab 10/18/23 1549  AST 18  ALT 14  ALKPHOS 44  BILITOT 1.1  PROT 5.5*  ALBUMIN 3.1*   CBG: Recent Labs  Lab 10/20/23 1303 10/20/23 1605 10/20/23 2238 10/21/23 0812 10/21/23 1211  GLUCAP 166* 122* 125* 153* 104*    Discharge time spent: greater than 30 minutes.  Signed: Lurene Shadow, MD [facility] 10/22/2023

## 2023-10-21 NOTE — TOC Transition Note (Signed)
Transition of Care [facility]) - CM/SW Discharge Note   Patient Details  Name: Lawrence [name] MRN: [id number] Date of Birth: 1956/09/17  Transition of Care [facility]) CM/SW Contact:  Truddie Hidden, RN Phone Number: 10/21/2023, 10:47 AM   Clinical Narrative:    Adelina Mings from [facility] notified of patient's discharge home today.   Patient notified a [employer] representative will call to scheduled his appointment for a SOC.          Patient Goals and CMS Choice      Discharge Placement                         Discharge Plan and Services Additional resources added to the After Visit Summary for                                       Social Determinants of Health (SDOH) Interventions SDOH Screenings   Food Insecurity: No Food Insecurity (10/19/2023)  Recent Concern: Food Insecurity - Food Insecurity Present (10/01/2023)  Housing: Low Risk  (10/19/2023)  Recent Concern: Housing - Medium Risk (08/13/2023)  Transportation Needs: No Transportation Needs (10/19/2023)  Utilities: Not At Risk (10/19/2023)  Alcohol Screen: Low Risk  (03/10/2023)  Depression (PHQ2-9): Low Risk  (10/01/2023)  Recent Concern: Depression (PHQ2-9) - Medium Risk (09/03/2023)  Financial Resource Strain: Low Risk  (08/18/2023)   Received from [facility]  Physical Activity: Inactive (03/10/2023)  Social Connections: Moderately Isolated (08/06/2023)  Stress: No Stress Concern Present (03/10/2023)  Tobacco Use: Medium Risk (10/13/2023)  Health Literacy: Adequate Health Literacy (08/06/2023)     Readmission Risk Interventions    09/30/2023   10:28 AM 08/05/2023   10:11 AM 07/13/2023   10:52 AM  Readmission Risk Prevention Plan  Transportation Screening Complete Complete Complete  Medication Review (RN Care Manager) Complete Complete Complete  PCP or Specialist appointment within 3-5 days of discharge Complete Complete Complete  SW Recovery Care/Counseling Consult Complete Complete Complete   Palliative Care Screening Not Applicable Not Applicable Not Applicable  Skilled Nursing Facility Not Applicable Not Applicable Not Applicable

## 2023-10-21 NOTE — Progress Notes (Signed)
     Referral received for Lawrence [name] re: goals of care discussion. Chart reviewed. Discharge orders are in place. Unable to complete GOC discussions prior to patient's discharge.   Georgiann Cocker, FNP-BC Palliative Medicine Team   NO CHARGE

## 2023-10-22 ENCOUNTER — Telehealth: Payer: Self-pay | Admitting: Family Medicine

## 2023-10-22 ENCOUNTER — Ambulatory Visit: Payer: Self-pay | Admitting: *Deleted

## 2023-10-22 NOTE — Patient Outreach (Signed)
Care Coordination   Initial Visit Note   10/22/2023 Name: Lawrence [name] MRN: [id number] DOB: [birth year]  Lawrence [name] is a 66 y.o. year old male who sees [name], Demetrios Isaacs, MD for primary care. I spoke with  Lawrence [name] by phone today.  What matters to the patients health and wellness today?  Patient denied having any community resource needs at this time. Patient confirmed that his grandson is now residing with him and assisting  with his daily care needs. Patient confirms that he did not qualify for personal care services through Medicaid. Patient currently on waiting list for meals on wheels-confirms receiving a U-card that assists with groceries. Patient to receive HH PT through [facility]. Pt denies having any mental health needs, CSW will continue to assess.    Goals Addressed             This Visit's Progress    Care Coordination Activities       Activities and task to complete in order to accomplish goals.   Please call Dr. Lee-Cardiologist to schedule your next follow up appointment CSW will refer to [facility] expect a call from Charlie Pitter to discuss a start date Keep all upcoming appointment discussed today-please ensure that you are coordinating with medicaid for transportation to all follow up appointments Self Support options  (*please continue to consider mental health follow up manage any anxiety symptoms that may arise)            SDOH assessments and interventions completed:  Yes  SDOH Interventions Today    Flowsheet Row Most Recent Value  SDOH Interventions   Food Insecurity Interventions Intervention Not Indicated  [receives U-card for meals $325.00, currently on waiting list for Meals on Wheels -referral to be completed for [employer] Meals]  Housing Interventions Intervention Not Indicated  Transportation Interventions Intervention Not Indicated  [utilized Medicaid transportation]  Utilities Interventions Intervention Not Indicated         Care Coordination Interventions:  Yes, provided  Interventions Today    Flowsheet Row Most Recent Value  Chronic Disease   Chronic disease during today's visit Congestive Heart Failure (CHF)  General Interventions   General Interventions Discussed/Reviewed General Interventions Discussed, [employer], Level of Care, Doctor Visits  [Needs assessment completed-pt confirms grandson resides with him to help with daily care-PT arranged during inpatient staty and will be calling patient today at 4pm to coordinate initial visit. Pt uses Medicaid transportation for transport to med appts]  Doctor Visits Discussed/Reviewed Doctor Visits Discussed, PCP  [Heart failure clinic 12/11, PCP, 1/24 will call to schedule f/u with Dr. Nedra Hai Cardiologist-would like to discuss options for a pacemaker]  PCP/Specialist Visits Compliance with follow-up visit  [Pt encouraged to call to schedule transportation to upcoming appointments]  Level of Care Personal Care Services  [Pt assessed for personal care services and did not qualify-grandson now assisting with his care for the last 3 months, neighbor assists with housekeeping.]  Mental Health Interventions   Mental Health Discussed/Reviewed Mental Health Discussed, Other  [ED visits addressed, pt denies symptoms of anxiety, stating that ED visits are primarily due to chest pain, Pt would like to follow up with Dr. Lee-cardiologist in [place]  before moving foward with Psychiatry-pt wanting to be considered for a pacemaker]  Nutrition Interventions   Nutrition Discussed/Reviewed Nutrition Discussed  [patient received U-card for measl $325.00 per month , currently in waiting list for Meals on Wheels-agreeable to referral to [employer] Meals.]  Safety  Interventions   Safety Discussed/Reviewed Safety Discussed  [encouraged to use mobility devices while ambulating for safety]       Follow up plan: Follow up call scheduled for 11/12/23    Encounter Outcome:   Patient Visit Completed

## 2023-10-22 NOTE — Patient Instructions (Signed)
Visit Information  Thank you for taking time to visit with me today. Please don't hesitate to contact me if I can be of assistance to you.   Following are the goals we discussed today:   Goals Addressed             This Visit's Progress    Care Coordination Activities       Activities and task to complete in order to accomplish goals.   Please call Dr. Lee-Cardiologist to schedule your next follow up appointment CSW will refer to [facility] expect a call from Charlie Pitter to discuss a start date Keep all upcoming appointment discussed today-please ensure that you are coordinating with medicaid for transportation to all follow up appointments Self Support options  (*please continue to consider mental health follow up manage any anxiety symptoms that may arise)            Our next appointment is by telephone on 11/12/23 at 1pm  Please call the care guide team at [phone] if you need to cancel or reschedule your appointment.   If you are experiencing a Mental Health or Behavioral Health Crisis or need someone to talk to, please call 911   Patient verbalizes understanding of instructions and care plan provided today and agrees to view in MyChart. Active MyChart status and patient understanding of how to access instructions and care plan via MyChart confirmed with patient.     Telephone follow up appointment with care management team member scheduled for: 11/12/23  Verna Czech, LCSW [place]  [facility] Licensed Clinical Social Worker Care Coordinator  Direct Dial: [phone]

## 2023-10-22 NOTE — Telephone Encounter (Signed)
Requested medication (s) are due for refill today: yes  Requested medication (s) are on the active medication list: yes  Last refill:  08/05/23 #60/0  Future visit scheduled: yes  Notes to clinic: not delegated.  Unable to refill per protocol, last refill by another provider.      Requested Prescriptions  Pending Prescriptions Disp Refills   ranolazine (RANEXA) 1000 MG SR tablet [Pharmacy Med Name: RANOLAZINE ER 1000 MG TAB] 60 tablet 0    Sig: TAKE 1 TABLET BY MOUTH TWICE A DAY     Not Delegated - Cardiovascular: Ranolazine Failed - 10/22/2023  3:30 PM      Failed - This refill cannot be delegated      Failed - Last BP in normal range    BP Readings from Last 1 Encounters:  10/21/23 (!) 139/97         Passed - K in normal range and within 360 days    Potassium  Date Value Ref Range Status  10/20/2023 4.7 3.5 - 5.1 mmol/L Final  03/07/2015 3.9 mmol/L Final    Comment:    3.5-5.1 NOTE: New Reference Range  01/22/15          Passed - Cr in normal range and within 360 days    Creatinine  Date Value Ref Range Status  03/07/2015 0.84 mg/dL Final    Comment:    4.09-8.11 NOTE: New Reference Range  01/22/15    Creatinine, Ser  Date Value Ref Range Status  10/20/2023 1.15 0.61 - 1.24 mg/dL Final   Creatinine, POC  Date Value Ref Range Status  10/27/2016 n/a mg/dL Final         Passed - Valid encounter within last 12 months    Recent Outpatient Visits           1 week ago Chronic hip pain, left   [facility] [place], Caryl Asp A, FNP   1 month ago Coronary artery disease involving native coronary artery of native heart with refractory angina pectoris [facility])   [facility] Malva Limes, MD   3 months ago FTT (failure to thrive) in adult   [facility] Merita Norton T, FNP   4 months ago Coronary artery disease of native artery of native heart with stable angina pectoris [facility])   [facility] Malva Limes, MD   5 months ago No-show for appointment   [facility] Merita Norton T, FNP       Future Appointments             In 1 month [name], Demetrios Isaacs, MD [name]

## 2023-10-23 DIAGNOSIS — I5042 Chronic combined systolic (congestive) and diastolic (congestive) heart failure: Secondary | ICD-10-CM | POA: Diagnosis not present

## 2023-10-23 DIAGNOSIS — E119 Type 2 diabetes mellitus without complications: Secondary | ICD-10-CM | POA: Diagnosis not present

## 2023-10-23 DIAGNOSIS — I110 Hypertensive heart disease with heart failure: Secondary | ICD-10-CM | POA: Diagnosis not present

## 2023-10-23 DIAGNOSIS — E785 Hyperlipidemia, unspecified: Secondary | ICD-10-CM | POA: Diagnosis not present

## 2023-10-23 DIAGNOSIS — I959 Hypotension, unspecified: Secondary | ICD-10-CM | POA: Diagnosis not present

## 2023-10-23 DIAGNOSIS — I480 Paroxysmal atrial fibrillation: Secondary | ICD-10-CM | POA: Diagnosis not present

## 2023-10-23 DIAGNOSIS — I2510 Atherosclerotic heart disease of native coronary artery without angina pectoris: Secondary | ICD-10-CM | POA: Diagnosis not present

## 2023-10-23 DIAGNOSIS — J4489 Other specified chronic obstructive pulmonary disease: Secondary | ICD-10-CM | POA: Diagnosis not present

## 2023-10-23 DIAGNOSIS — J9611 Chronic respiratory failure with hypoxia: Secondary | ICD-10-CM | POA: Diagnosis not present

## 2023-10-26 ENCOUNTER — Telehealth: Payer: Self-pay | Admitting: Family

## 2023-10-26 ENCOUNTER — Telehealth: Payer: Self-pay

## 2023-10-26 DIAGNOSIS — I2510 Atherosclerotic heart disease of native coronary artery without angina pectoris: Secondary | ICD-10-CM | POA: Diagnosis not present

## 2023-10-26 DIAGNOSIS — E785 Hyperlipidemia, unspecified: Secondary | ICD-10-CM | POA: Diagnosis not present

## 2023-10-26 DIAGNOSIS — J4489 Other specified chronic obstructive pulmonary disease: Secondary | ICD-10-CM | POA: Diagnosis not present

## 2023-10-26 DIAGNOSIS — I480 Paroxysmal atrial fibrillation: Secondary | ICD-10-CM | POA: Diagnosis not present

## 2023-10-26 DIAGNOSIS — I110 Hypertensive heart disease with heart failure: Secondary | ICD-10-CM | POA: Diagnosis not present

## 2023-10-26 DIAGNOSIS — I959 Hypotension, unspecified: Secondary | ICD-10-CM | POA: Diagnosis not present

## 2023-10-26 DIAGNOSIS — E119 Type 2 diabetes mellitus without complications: Secondary | ICD-10-CM | POA: Diagnosis not present

## 2023-10-26 DIAGNOSIS — I5042 Chronic combined systolic (congestive) and diastolic (congestive) heart failure: Secondary | ICD-10-CM | POA: Diagnosis not present

## 2023-10-26 DIAGNOSIS — J9611 Chronic respiratory failure with hypoxia: Secondary | ICD-10-CM | POA: Diagnosis not present

## 2023-10-26 NOTE — Telephone Encounter (Unsigned)
Copied from CRM [id number]. Topic: General - Other >> Oct 26, 2023  3:14 PM Marlow Baars wrote: Reason for CRM: Soni PT with [facility] called in to inform the provider they are discharging the patient per his request.

## 2023-10-26 NOTE — Telephone Encounter (Signed)
Pt confirmed appt for 10/27/23

## 2023-10-27 ENCOUNTER — Encounter: Payer: Medicare HMO | Admitting: Family

## 2023-10-27 NOTE — Progress Notes (Unsigned)
Advanced Heart Failure Clinic Note    PCP: Malva Limes, MD (last seen 11/24) Cardiologist: Dorothyann Peng, MD (last seen 07/24) [facility] cardiology (last seen 10/24)  HPI:  Lawrence [name] is a 67 y/o male with a history of paroxysmal atrial fibrillation, coronary artery disease s/p CABG X2 with LIMA to the LAD and SVG to OM (2002); procedure was complicated by coronary artery dissection, asthma, osteoarthritis, hyperlipidemia, GERD, anxiety, DDD, diverticulosis, COPD, CKD essential hypertension and PSVT, type II diabetes mellitus, OSA & chronic heart failure. S/p PCI/DES to mid LAD with subsequent PTCA (2016), proximal to mid LAD stents (2014), distal LAD stent (unknown date), stent in the ostium SVG (unknown date) & circumflex/OM 1 bifucation stent (2013).   Has had 12 caths since 2014. In the last 3 months, he has had 15 ED visits and 6 admissions most of which are for chest pain/ hypotension with the most recent admission being 10/18/23  Was in the ED 05/14/23 due to 20 episodes of diarrhea. Abdominal CT negative and negative for e coli. Antibiotics provided. Admitted 05/16/23 due to acute onset of midsternal and left-sided chest pain felt as tightness and graded 9/10 in severity with no radiation however with associated nausea as well as dyspnea and palpitations. Per Dr. Darrold Junker, likely that one of the patient's bypass grafts has occluded with his recent NSTEMI (per his review of films, unlikely to be amenable to an intervention. IV diuresed. Has had 7 ED visits July 2024, 2 ED visits Aug 2024. Most recently was admitted 07/11/23 due to acute onset of midsternal chest pain, nausea and diaphoresis. He described the sensation as an elephant sitting on his chest. He also complained of cough productive of yellowish sputum and wheezing. Was recently treated for covid for which he was given molnupiravir & completed 07/13/23. Chest pain, mildly elevated but flat troponins: He was seen by the cardiologist.  Chest pain thought to be noncardiac in etiology. Cardiology consult done.    Admitted 10/18/23 due to weakness, fatigue and chest pain. Found to be hypotensive of 88/61. IVF given. Spironolactone stopped, isosorbide decreased. EKG showed atrial fibrillation.    Echo 05/01/23: EF 50-55% with mild LVH and mild/ moderate LAE Echo 08/08/23: EF 30-35% with Grade II DD, severe LAE, moderate Lawrence, mild/ moderate AR Echo 08/18/23: EF 55-60%  LHC 08/18/23: LM normal; LAD with moderate diffuse disease and patent stent; 50% ostial LCX with competitive flow in OM1; RCA with mild diffuse disease.  1/2 bypass grafts open: LIMA to LAD atretic, SVG to OM1 with moderate stenosis in the first portion of the graft body, similar to previous.  Elevated LVEDP = 24 mmHg.   He presents today for a HF f/u visit with a chief complaint of    Previous cardiac studies:  Echo 06/26/15 EF 60-65% along with mild LVH and trivial AR.  Echo 04/19/18: EF 50-55% Echo 03/08/20: EF 45-50% along with mild Lawrence Echo 11/02/22: EF 50-55% with mild LVH, moderate LAE  LHC 11/02/22:   Prox LAD to Mid LAD lesion is 30% stenosed.   Mid LAD lesion is 40% stenosed.   Prox RCA lesion is 15% stenosed.   Dist RCA lesion is 25% stenosed.   1st Mrg lesion is 70% stenosed.   RPAV-2 lesion is 60% stenosed.   RPAV-1 lesion is 85% stenosed with 85% stenosed side branch in 1st RPL.   RPDA-1 lesion is 90% stenosed.   RPDA-2 lesion is 85% stenosed.   Non-stenotic Mid LAD to [facility]  LAD lesion was previously treated.   Non-stenotic Prox Cx to Mid Cx lesion was previously treated.   Non-stenotic Origin to Prox Graft lesion was previously treated.   LIMA and is small.   There is mild left ventricular systolic dysfunction.   LV end diastolic pressure is mildly elevated.   The left ventricular ejection fraction is 45-50% by visual estimate.  Plan Left ventriculogram shows mildly dilated LV with apical hypokinesis EF around 45-50 Coronaries Left main mild  irregularities LAD mild to moderate disease.  Diagonal 1 Circumflex large minor irregularities occluded OM1 RCA large minor irregularities moderate disease TIMI-3 flow right dominant No significant collaterals  Grafts LIMA to mid LAD atretic SVG to OM1 widely patent  Patient cath essentially improved from previous procedure done 04/2021.  No areas of significant obstructive disease requiring intervention.   ROS: All systems negative except as listed in HPI, PMH and Problem List.  SH:  Social History   Socioeconomic History   Marital status: Widowed    Spouse name: Not on file   Number of children: 1   Years of education: Not on file   Highest education level: 10th grade  Occupational History   Occupation: Disabled   Occupation: on Tree surgeon  Tobacco Use   Smoking status: Former    Current packs/day: 0.00    Average packs/day: 3.0 packs/day for 50.0 years (150.0 ttl pk-yrs)    Types: Cigarettes    Start date: 04/23/1963    Quit date: 04/22/2013    Years since quitting: 10.5   Smokeless tobacco: Never   Tobacco comments:    Reports not smoking for approx 8 years.  Vaping Use   Vaping status: Never Used  Substance and Sexual Activity   Alcohol use: No    Comment: remotely quit alcohol use. Hx of heavy alcohol use.   Drug use: Not Currently    Types: Marijuana   Sexual activity: Not Currently  Other Topics Concern   Not on file  Social History Narrative   Pt lives in [place] with wife.  Does not routinely exercise.   Social Determinants of Health   Financial Resource Strain: Low Risk  (08/18/2023)   Received [facility]   Overall Financial Resource Strain (CARDIA)    Difficulty of Paying Living Expenses: Not very hard  Food Insecurity: No Food Insecurity (10/22/2023)   Hunger Vital Sign    Worried About Running Out of Food in the Last Year: Never true    Ran Out of Food in the Last Year: Never true  Recent Concern: Food Insecurity - Food Insecurity  Present (10/01/2023)   Hunger Vital Sign    Worried About Running Out of Food in the Last Year: Sometimes true    Ran Out of Food in the Last Year: Sometimes true  Transportation Needs: No Transportation Needs (10/22/2023)   PRAPARE - Administrator, Civil Service (Medical): No    Lack of Transportation (Non-Medical): No  Physical Activity: Inactive (03/10/2023)   Exercise Vital Sign    Days of Exercise per Week: 0 days    Minutes of Exercise per Session: 0 min  Stress: No Stress Concern Present (03/10/2023)   [employer] of Occupational Health - Occupational Stress Questionnaire    Feeling of Stress : Not at all  Social Connections: Moderately Isolated (08/06/2023)   Social Connection and Isolation Panel [NHANES]    Frequency of Communication with Friends and Family: More than three times a week  Frequency of Social Gatherings with Friends and Family: More than three times a week    Attends Religious Services: More than 4 times per year    Active Member of [employer] or Organizations: No    Attends Banker Meetings: Not on file    Marital Status: Widowed  Intimate Partner Violence: Not At Risk (10/22/2023)   Humiliation, Afraid, Rape, and Kick questionnaire    Fear of Current or Ex-Partner: No    Emotionally Abused: No    Physically Abused: No    Sexually Abused: No    FH:  Family History  Problem Relation Age of Onset   Heart attack Mother    Depression Mother    Heart disease Mother    COPD Mother    Hypertension Mother    Heart attack Father    Diabetes Father    Depression Father    Heart disease Father    Cirrhosis Father    Parkinson's disease Brother     Past Medical History:  Diagnosis Date   A-fib (HCC)    Anemia    Anginal pain (HCC)    Anxiety    Arthritis    Asthma    CAD (coronary artery disease)    a. 2002 CABGx2 (LIMA->LAD, VG->VG->OM1);  b. 09/2012 DES->OM;  c. 03/2015 PTCA of LAD ([facility]) in setting of atretic LIMA; d. 05/2015  Cath [facility]): nonobs dzs; e. 06/2015 Cath ([facility]): LM nl, LAD 45p/d ISR, 50d, D1/2 small, LCX 50p/d ISR, OM1 70ost, 30 ISR, VG->OM1 50ost, 38m, LIMA->LAD 99p/d - atretic, RCA dom, nl; f.cath 10/16: 40-50%(FFR 0.90) pLAD, 75% (FFR 0.77) mLAD s/p PCI/DES, oRCA 40% (FFR0.95)   Cancer (HCC)    SKIN CANCER ON BACK   Celiac disease    Chronic diastolic CHF (congestive heart failure) (HCC)    a. 06/2009 Echo: EF 60-65%, Gr 1 DD, triv AI, mildly dil LA, nl RV.   COPD (chronic obstructive pulmonary disease) (HCC)    a. Chronic bronchitis and emphysema.   DDD (degenerative disc disease), lumbar    Diverticulosis    Dysrhythmia    Essential hypertension    GERD (gastroesophageal reflux disease)    History of hiatal hernia    History of kidney stones    H/O   History of tobacco abuse    a. Quit 2014.   Myocardial infarction [facility]) 2002   4 STENTS   Pancreatitis    PSVT (paroxysmal supraventricular tachycardia) (HCC)    a. 10/2012 Noted on Zio Patch.   Sleep apnea    LOST CORD TO CPAP -ONLY 02 @ BEDTIME   Tubular adenoma of colon    Type II diabetes mellitus (HCC)       PHYSICAL EXAM:  General:  Well appearing. No resp difficulty HEENT: normal Neck: supple. JVP flat.  No lymphadenopathy or thryomegaly appreciated. Cor: PMI normal. Irregular rhythm, bradycardic. No rubs, gallops or murmurs. Lungs: clear Abdomen: soft, nontender, nondistended. No hepatosplenomegaly. No bruits or masses.  Extremities: no cyanosis, clubbing, rash, edema Neuro: alert & orientedx3, cranial nerves grossly intact. Moves all 4 extremities w/o difficulty. Affect pleasant.   ECG: not done   ASSESSMENT & PLAN:  1: Chronic ischemic heart failure with preserved ejection fraction- - CABG in addition to PCI/DES to mid LAD with subsequent PTCA (2016), proximal to mid LAD stents (2014), distal LAD stent (unknown date), stent in the ostium SVG (unknown date) & circumflex/OM 1 bifucation stent (2013).  - NYHA class III -  euvolemic -  weighing daily; reminded to call for an overnight weight gain of > 2 pounds or a weekly weight gain of >5 pounds - weight 194 from last visit here 3 months ago - Echo 06/26/15 EF 60-65% along with mild LVH and trivial AR.  - Echo 04/19/18: EF 50-55% - Echo 03/08/20: EF 45-50% along with mild Lawrence - Echo 11/02/22: EF 50-55% with mild LVH, moderate LAE - Echo 05/01/23: EF 50-55% with mild LVH and mild/ moderate LAE - Echo 08/08/23: EF 30-35% with Grade II DD, severe LAE, moderate Lawrence, mild/ moderate AR - Echo 08/18/23: EF 55-60% - continue lisinopril 20mg  daily; consider transition to entresto - continue metoprolol succinate 12.5mg  daily - continue torsemide 50mg  daily - jardiance caused perineal pain - discussed the importance of rest and adequate hydration to aid in recovering from his recent covid infection - BNP 10/18/23 was 196.3  2: HTN- - BP  - saw PCP Ephriam Knuckles) 11/24 - BMP 10/20/23 showed sodium 139, potassium 4.7, creatinine 1.15 & GFR >60  3: CAD- - saw cardiology Juliann Pares) 07/24 - saw [facility] cardiology 10/24 - continue atorvastatin 80mg  daily - continue plavix 75mg  daily - continue isosorbide [place] 120mg  daily - continue ranexa 1000mg  BID - LHC 08/18/23: LM normal; LAD with moderate diffuse disease and patent stent; 50% ostial LCX with competitive flow in OM1; RCA with mild diffuse disease. 1/2 bypass grafts open: LIMA to LAD atretic, SVG to OM1 with moderate stenosis in the first portion   of the graft body, similar to previous.  Elevated LVEDP = 24 mmHg.   4: Atrial fibrillation- - continue amiodarone 200mg  daily - continue eliquis 5mg  BID - irregular rhythm today  5: DM- - A1c 07/29/23 was 5.5%

## 2023-10-28 ENCOUNTER — Other Ambulatory Visit: Payer: Self-pay

## 2023-10-28 NOTE — Telephone Encounter (Signed)
I don't prescribed this medication, they need to send to his cardiologist.

## 2023-10-29 ENCOUNTER — Emergency Department
Admission: EM | Admit: 2023-10-29 | Discharge: 2023-10-29 | Disposition: A | Discharge status: Home / Self Care | Payer: Medicare HMO | Attending: Emergency Medicine | Admitting: Emergency Medicine

## 2023-10-29 ENCOUNTER — Emergency Department: Payer: Medicare HMO

## 2023-10-29 ENCOUNTER — Telehealth: Payer: Self-pay | Admitting: Family Medicine

## 2023-10-29 ENCOUNTER — Other Ambulatory Visit: Payer: Self-pay

## 2023-10-29 DIAGNOSIS — J449 Chronic obstructive pulmonary disease, unspecified: Secondary | ICD-10-CM | POA: Insufficient documentation

## 2023-10-29 DIAGNOSIS — R002 Palpitations: Secondary | ICD-10-CM | POA: Insufficient documentation

## 2023-10-29 DIAGNOSIS — I5042 Chronic combined systolic (congestive) and diastolic (congestive) heart failure: Secondary | ICD-10-CM | POA: Diagnosis not present

## 2023-10-29 DIAGNOSIS — J9811 Atelectasis: Secondary | ICD-10-CM | POA: Diagnosis not present

## 2023-10-29 DIAGNOSIS — K5730 Diverticulosis of large intestine without perforation or abscess without bleeding: Secondary | ICD-10-CM | POA: Diagnosis not present

## 2023-10-29 DIAGNOSIS — R079 Chest pain, unspecified: Secondary | ICD-10-CM | POA: Diagnosis not present

## 2023-10-29 DIAGNOSIS — J439 Emphysema, unspecified: Secondary | ICD-10-CM | POA: Diagnosis not present

## 2023-10-29 DIAGNOSIS — I2510 Atherosclerotic heart disease of native coronary artery without angina pectoris: Secondary | ICD-10-CM | POA: Diagnosis not present

## 2023-10-29 DIAGNOSIS — I110 Hypertensive heart disease with heart failure: Secondary | ICD-10-CM | POA: Insufficient documentation

## 2023-10-29 DIAGNOSIS — I4891 Unspecified atrial fibrillation: Secondary | ICD-10-CM | POA: Diagnosis not present

## 2023-10-29 DIAGNOSIS — J45909 Unspecified asthma, uncomplicated: Secondary | ICD-10-CM | POA: Diagnosis not present

## 2023-10-29 DIAGNOSIS — R0789 Other chest pain: Secondary | ICD-10-CM | POA: Diagnosis not present

## 2023-10-29 DIAGNOSIS — E278 Other specified disorders of adrenal gland: Secondary | ICD-10-CM | POA: Diagnosis not present

## 2023-10-29 DIAGNOSIS — I10 Essential (primary) hypertension: Secondary | ICD-10-CM | POA: Diagnosis not present

## 2023-10-29 LAB — BASIC METABOLIC PANEL
Anion gap: 8 (ref 5–15)
BUN: 15 mg/dL (ref 8–23)
CO2: 28 mmol/L (ref 22–32)
Calcium: 9.3 mg/dL (ref 8.9–10.3)
Chloride: 104 mmol/L (ref 98–111)
Creatinine, Ser: 0.89 mg/dL (ref 0.61–1.24)
GFR, Estimated: >60 mL/min (ref >60)
Glucose, Bld: 119 mg/dL — ABNORMAL HIGH (ref 70–99)
Potassium: 4 mmol/L (ref 3.5–5.1)
Sodium: 140 mmol/L (ref 135–145)

## 2023-10-29 LAB — CBC
HCT: 51.5 % (ref 39.0–52.0)
Hemoglobin: 16.5 g/dL (ref 13.0–17.0)
MCH: 29.3 pg (ref 26.0–34.0)
MCHC: 32 g/dL (ref 30.0–36.0)
MCV: 91.3 fL (ref 80.0–100.0)
Platelets: 168 10*3/uL (ref 150–400)
RBC: 5.64 MIL/uL (ref 4.22–5.81)
RDW: 14.4 % (ref 11.5–15.5)
WBC: 6 10*3/uL (ref 4.0–10.5)
nRBC: 0 % (ref 0.0–0.2)

## 2023-10-29 LAB — HEPATIC FUNCTION PANEL
ALT: 29 U/L (ref 0–44)
AST: 28 U/L (ref 15–41)
Albumin: 4 g/dL (ref 3.5–5.0)
Alkaline Phosphatase: 63 U/L (ref 38–126)
Bilirubin, Direct: <0.1 mg/dL (ref 0.0–0.2)
Total Bilirubin: 0.4 mg/dL (ref <1.2)
Total Protein: 6.8 g/dL (ref 6.5–8.1)

## 2023-10-29 LAB — TROPONIN I (HIGH SENSITIVITY)
Troponin I (High Sensitivity): 20 ng/L — ABNORMAL HIGH (ref <18)
Troponin I (High Sensitivity): 18 ng/L — ABNORMAL HIGH (ref <18)

## 2023-10-29 LAB — LIPASE, BLOOD: Lipase: 33 U/L (ref 11–51)

## 2023-10-29 MED ORDER — ONDANSETRON HCL 4 MG/2ML IJ SOLN
4.0000 mg | Freq: Once | INTRAMUSCULAR | Status: AC
  Administered 2023-10-29: 4 mg via INTRAVENOUS
  Filled 2023-10-29: qty 2

## 2023-10-29 MED ORDER — IOHEXOL 350 MG/ML SOLN
100.0000 mL | Freq: Once | INTRAVENOUS | Status: AC | PRN
  Administered 2023-10-29: 100 mL via INTRAVENOUS

## 2023-10-29 MED ORDER — ACETAMINOPHEN 500 MG PO TABS
1000.0000 mg | ORAL_TABLET | Freq: Once | ORAL | Status: AC
  Administered 2023-10-29: 1000 mg via ORAL
  Filled 2023-10-29: qty 2

## 2023-10-29 MED ORDER — MORPHINE SULFATE (PF) 4 MG/ML IV SOLN
4.0000 mg | Freq: Once | INTRAVENOUS | Status: AC
  Administered 2023-10-29: 4 mg via INTRAVENOUS
  Filled 2023-10-29: qty 1

## 2023-10-29 MED ORDER — SUCRALFATE 1 G PO TABS
1.0000 g | ORAL_TABLET | Freq: Once | ORAL | Status: AC
  Administered 2023-10-29: 1 g via ORAL
  Filled 2023-10-29: qty 1

## 2023-10-29 MED ORDER — FAMOTIDINE IN NACL 20-0.9 MG/50ML-% IV SOLN
20.0000 mg | Freq: Once | INTRAVENOUS | Status: AC
  Administered 2023-10-29: 20 mg via INTRAVENOUS
  Filled 2023-10-29: qty 50

## 2023-10-29 MED ORDER — ALUM & MAG HYDROXIDE-SIMETH 200-200-20 MG/5ML PO SUSP
30.0000 mL | Freq: Once | ORAL | Status: AC
Start: 2023-10-29 — End: 2023-10-29
  Administered 2023-10-29: 30 mL via ORAL
  Filled 2023-10-29: qty 30

## 2023-10-29 MED ORDER — PANTOPRAZOLE SODIUM 40 MG IV SOLR
40.0000 mg | Freq: Once | INTRAVENOUS | Status: AC
  Administered 2023-10-29: 40 mg via INTRAVENOUS
  Filled 2023-10-29: qty 10

## 2023-10-29 NOTE — Telephone Encounter (Signed)
[facility] certification paperwork received 10/28/23 front desk portion filled out and placed into provider mailbox12/13/24

## 2023-10-29 NOTE — Discharge Instructions (Addendum)
Please call your cardiologist to make a follow-up appointment but today your numbers and CT imaging were reassuring.  Go back up on your Imdur to help with your chest pain and return to the ER if you develop worsening symptoms or any other concerns.   IMPRESSION: 1. Stable atherosclerotic calcification of the thoracic and aorta but no aneurysm or dissection. 2. No pulmonary embolism. 3. No acute pulmonary findings. 4. Stable 7 mm right middle lobe pulmonary nodule. Recommend follow-up noncontrast chest CT in 6-12 months. 5. No acute abdominal/pelvic findings, mass lesions or adenopathy. 6. Stable bilateral low-attenuation adrenal gland nodules consistent with benign adenomas. 7. Status post cholecystectomy. No biliary dilatation.

## 2023-10-29 NOTE — ED Triage Notes (Signed)
Pt to ED [facility] from home for palpitations started at 0300 with chest pain. Hx a fib NAD noted. RR even and unlabored.  324 asa, 3 nitroglycerin PTA Wears O2 at night

## 2023-10-29 NOTE — ED Provider Notes (Signed)
[facility] Provider Note    None    (approximate)   History   Chest Pain   HPI  Lawrence [name] is a 67 y.o. male  atrial fibrillation, bradycardia, anxiety, osteoarthritis, asthma, coronary artery disease, chronic chest pain, celiac disease, COPD, chronic hypoxemic respiratory failure, chronic low back pain, GERD, hypertension,, coronary artery disease s/p coronary stent, chronic diastolic and systolic CHF (echo in September 2024 showed EF estimated at 30 to 35%, grade 2 diastolic dysfunction), repeat echo in October 2024 showed EF estimated at 55 to 60% who comes in with concerns for chest pain.  Reviewed patient's prior EKGs and he looks like he is in chronic rate controlled A-fib.  I reviewed the hospital discharge summary from 10/21/2023.  Patient is on carvedilol that is low-dose given his history of bradycardia.  He is also on Eliquis.  He has had CT imaging that has shown small hiatal hernia which could be causing some of his chest discomfort.  Prior to the arrival patient took 3 nitro, full aspirin  Patient reports he woke up around 3 AM with some palpitations and chest tightness as a 10 out of 10.  He states that this happened previously.  He reports extensive cardiac history with stents placements, bypass.  He does report being compliant with his Eliquis.  He denies this feeling like a burning sensation.  Denies any new significant COPD symptoms.  Reports that he does have some baseline wheeze  Physical Exam   Triage Vital Signs: ED Triage Vitals  Encounter Vitals Group     BP 10/29/23 0751 (!) 168/109     Systolic BP Percentile --      Diastolic BP Percentile --      Pulse Rate 10/29/23 0751 85     Resp 10/29/23 0751 20     Temp 10/29/23 0751 98 F (36.7 C)     Temp src --      SpO2 10/29/23 0751 96 %     Weight 10/29/23 0748 182 lb 15.7 oz (83 kg)     Height 10/29/23 0748 5\' 7"  (1.702 m)     Head Circumference --      Peak Flow --       Pain Score 10/29/23 0748 10     Pain Loc --      Pain Education --      Exclude from Growth Chart --     Most recent vital signs: Vitals:   10/29/23 0751  BP: (!) 168/109  Pulse: 85  Resp: 20  Temp: 98 F (36.7 C)  SpO2: 96%     General: Awake, no distress.  CV:  Good peripheral perfusion.  Resp:  Normal effort.  Slight wheeze noted Abd:  No distention.  Soft and nontender Other:  No swelling in legs.  No calf tenderness.  Equal pulses DP.  Equal radial pulses.  Sensation intact throughout   ED Results / Procedures / Treatments   Labs (all labs ordered are listed, but only abnormal results are displayed) Labs Reviewed  BASIC METABOLIC PANEL  CBC  TROPONIN I (HIGH SENSITIVITY)     EKG  My interpretation of EKG:  Atrial fibrillation with a rate of 85 without any ST elevation or T wave inversions, normal intervals  RADIOLOGY I have reviewed the xray personally and interpreted and no evidence of pneumonia   PROCEDURES:  Critical Care performed: No  Procedures   MEDICATIONS ORDERED IN ED: Medications  famotidine (PEPCID) IVPB  20 mg premix (has no administration in time range)  acetaminophen (TYLENOL) tablet 1,000 mg (has no administration in time range)  sucralfate (CARAFATE) tablet 1 g (has no administration in time range)  alum & mag hydroxide-simeth (MAALOX/MYLANTA) 200-200-20 MG/5ML suspension 30 mL (has no administration in time range)  morphine (PF) 4 MG/ML injection 4 mg (4 mg Intravenous Given 10/29/23 0837)  ondansetron (ZOFRAN) injection 4 mg (4 mg Intravenous Given 10/29/23 0836)  morphine (PF) 4 MG/ML injection 4 mg (4 mg Intravenous Given 10/29/23 1020)  pantoprazole (PROTONIX) injection 40 mg (40 mg Intravenous Given 10/29/23 1021)  iohexol (OMNIPAQUE) 350 MG/ML injection 100 mL (100 mLs Intravenous Contrast Given 10/29/23 1230)     IMPRESSION / MDM / ASSESSMENT AND PLAN / ED COURSE  I reviewed the triage vital signs and the nursing  notes.   Patient's presentation is most consistent with acute presentation with potential threat to life or bodily function.   Patient comes in with concerns for increasing chest pain in the setting of having his Imdur decreased from 120 mg to 60 mg due to hypotension.  Differential includes ACS, angina, COPD, hiatal hernia.  Doubt dissection given equal radial pulses no numbness, no tingling.  Doubt PE given patient is on a blood thinner.  Patient is currently in A-fib but rate control and it looks like that is chronic for him.  He does report taking his carvedilol this morning.  Initial troponin is 20.  BMP reassuring CBC normal  Repeat troponin is downtrending to 18.  He said similar troponin elevation previously.  I considered admission for him but he has had a catheterization just 2 months ago that did not see any obstructive disease and no stents were placed.  I discussed with patient that I am not sure if his pain is cardiac in nature and that could be related to his hiatal hernia.  He is requesting more pain medication.  I have stated that he is already had 2 doses of morphine he is not finding any improvement I feel we should try some different medications.  Patient states that normally they give him 3 doses of pain medication and then he will start to feel better.  I reviewed some of his prior visits I do not see where he is gotten 3 doses of morphine in the two visits I looked at.  I explained the patient that this time given we ruled him out from a cardiac perspective we would try some other medications.  He expressed understanding.  Given he is significantly more hypertensive today than his baseline I will get a CT dissection just to ensure were not missing a type of dissection however patient understands that this is negative that he would go home.  We discussed going back up on his Imdur in case it could be related to coming down and his blood pressure looks like he could tolerate it in the  setting of him having also discontinued his spironolactone.  IMPRESSION: 1. Stable atherosclerotic calcification of the thoracic and aorta but no aneurysm or dissection. 2. No pulmonary embolism. 3. No acute pulmonary findings. 4. Stable 7 mm right middle lobe pulmonary nodule. Recommend follow-up noncontrast chest CT in 6-12 months. 5. No acute abdominal/pelvic findings, mass lesions or adenopathy. 6. Stable bilateral low-attenuation adrenal gland nodules consistent with benign adenomas. 7. Status post cholecystectomy. No biliary dilatation.   2:18 PM discussed with patient his reassuring workup and recommended following up outpatient for repeat CT imaging in 6  to 12 months.  He expressed understanding.  He reportss continued pain.  I discussed with patient that I do not feel he this is related to his heart and that he can be discharged home and can restart his Imdur.  Patient states that he would like a second opinion at [facility].  Explained to patient that I am clearing him to be discharged and that he would like to go to [facility] that is up to him.  I will discuss with our cardiologist just to ensure there is nothing else they would offer him here but we discussed that we would not be giving any additional narcotic pain medication.  3:01 PM discussed with Dr. Juliann Pares.  Other than going back up on his Imdur no indication for admission he would not cath him again and he agrees with discharge home.  Patient has chronic chest pain and this can be followed up outpatient with his cardiologist.  The patient is on the cardiac monitor to evaluate for evidence of arrhythmia and/or significant heart rate changes.      FINAL CLINICAL IMPRESSION(S) / ED DIAGNOSES   Final diagnoses:  Atypical chest pain     Rx / DC Orders   ED Discharge Orders     None        Note:  This document was prepared using Dragon voice recognition software and may include unintentional dictation errors.   Concha Se, MD 10/29/23 [phone]

## 2023-10-31 ENCOUNTER — Other Ambulatory Visit: Payer: Self-pay

## 2023-10-31 ENCOUNTER — Emergency Department
Admission: EM | Admit: 2023-10-31 | Discharge: 2023-10-31 | Disposition: A | Discharge status: Home / Self Care | Payer: Medicare HMO | Attending: Emergency Medicine | Admitting: Emergency Medicine

## 2023-10-31 ENCOUNTER — Emergency Department: Payer: Medicare HMO

## 2023-10-31 DIAGNOSIS — I10 Essential (primary) hypertension: Secondary | ICD-10-CM | POA: Diagnosis not present

## 2023-10-31 DIAGNOSIS — I480 Paroxysmal atrial fibrillation: Secondary | ICD-10-CM | POA: Diagnosis not present

## 2023-10-31 DIAGNOSIS — G894 Chronic pain syndrome: Secondary | ICD-10-CM | POA: Diagnosis not present

## 2023-10-31 DIAGNOSIS — R0789 Other chest pain: Secondary | ICD-10-CM | POA: Diagnosis not present

## 2023-10-31 DIAGNOSIS — Z7901 Long term (current) use of anticoagulants: Secondary | ICD-10-CM | POA: Insufficient documentation

## 2023-10-31 DIAGNOSIS — R079 Chest pain, unspecified: Secondary | ICD-10-CM | POA: Diagnosis not present

## 2023-10-31 LAB — COMPREHENSIVE METABOLIC PANEL
ALT: 26 U/L (ref 0–44)
AST: 21 U/L (ref 15–41)
Albumin: 3.7 g/dL (ref 3.5–5.0)
Alkaline Phosphatase: 60 U/L (ref 38–126)
Anion gap: 9 (ref 5–15)
BUN: 13 mg/dL (ref 8–23)
CO2: 28 mmol/L (ref 22–32)
Calcium: 8.7 mg/dL — ABNORMAL LOW (ref 8.9–10.3)
Chloride: 103 mmol/L (ref 98–111)
Creatinine, Ser: 0.91 mg/dL (ref 0.61–1.24)
GFR, Estimated: >60 mL/min (ref >60)
Glucose, Bld: 103 mg/dL — ABNORMAL HIGH (ref 70–99)
Potassium: 3.9 mmol/L (ref 3.5–5.1)
Sodium: 140 mmol/L (ref 135–145)
Total Bilirubin: 0.6 mg/dL (ref <1.2)
Total Protein: 6.4 g/dL — ABNORMAL LOW (ref 6.5–8.1)

## 2023-10-31 LAB — CBC
HCT: 48.7 % (ref 39.0–52.0)
Hemoglobin: 15.7 g/dL (ref 13.0–17.0)
MCH: 29.5 pg (ref 26.0–34.0)
MCHC: 32.2 g/dL (ref 30.0–36.0)
MCV: 91.5 fL (ref 80.0–100.0)
Platelets: 178 10*3/uL (ref 150–400)
RBC: 5.32 MIL/uL (ref 4.22–5.81)
RDW: 14.2 % (ref 11.5–15.5)
WBC: 6 10*3/uL (ref 4.0–10.5)
nRBC: 0 % (ref 0.0–0.2)

## 2023-10-31 LAB — TROPONIN I (HIGH SENSITIVITY)
Troponin I (High Sensitivity): 17 ng/L (ref <18)
Troponin I (High Sensitivity): 20 ng/L — ABNORMAL HIGH (ref <18)

## 2023-10-31 MED ORDER — OXYCODONE HCL 5 MG PO TABS
10.0000 mg | ORAL_TABLET | Freq: Once | ORAL | Status: AC
  Administered 2023-10-31: 10 mg via ORAL
  Filled 2023-10-31 (×2): qty 2

## 2023-10-31 MED ORDER — ACETAMINOPHEN 500 MG PO TABS
1000.0000 mg | ORAL_TABLET | Freq: Once | ORAL | Status: AC
  Administered 2023-10-31: 1000 mg via ORAL
  Filled 2023-10-31 (×2): qty 2

## 2023-10-31 NOTE — ED Provider Notes (Signed)
[facility] Provider Note    Event Date/Time   First MD Initiated Contact with Patient 10/31/23 0235     (approximate)   History   Chest Pain   HPI  Lawrence [name] is a 67 y.o. male who presents to the ED for evaluation of Chest Pain   I reviewed ED visit from less than 48 hours ago as well as medical DC summary from 12/5.  History of chronic pain syndrome, CAD, paroxysmal A-fib on Eliquis. During recent ED visit, CTA dissection study performed with no acute features.  Case was discussed with cardiology.  No indication for admission, and can be managed as an outpatient.  Patient presents for evaluation of about 48 hours of continued chest pain.  Reports he had some labile blood pressures at home tonight and so a family member told him he should probably get checked out.  He did restart his Imdur with minimal improvement.  No coexisting symptoms beyond continued pain  Physical Exam   Triage Vital Signs: ED Triage Vitals  Encounter Vitals Group     BP 10/31/23 0101 (!) 151/123     Systolic BP Percentile --      Diastolic BP Percentile --      Pulse Rate 10/31/23 0101 77     Resp 10/31/23 0101 17     Temp 10/31/23 0101 97.7 F (36.5 C)     Temp Source 10/31/23 0101 Oral     SpO2 10/31/23 0101 99 %     Weight 10/31/23 0100 185 lb (83.9 kg)     Height 10/31/23 0100 5\' 7"  (1.702 m)     Head Circumference --      Peak Flow --      Pain Score 10/31/23 0059 10     Pain Loc --      Pain Education --      Exclude from Growth Chart --     Most recent vital signs: Vitals:   10/31/23 0101 10/31/23 0437  BP: (!) 151/123 126/62  Pulse: 77 68  Resp: 17 16  Temp: 97.7 F (36.5 C) 98 F (36.7 C)  SpO2: 99% 99%    General: Awake, no distress.  CV:  Good peripheral perfusion.  Resp:  Normal effort.  Abd:  No distention.  MSK:  No deformity noted.  Neuro:  No focal deficits appreciated. Other:     ED Results / Procedures / Treatments    Labs (all labs ordered are listed, but only abnormal results are displayed) Labs Reviewed  COMPREHENSIVE METABOLIC PANEL - Abnormal; Notable for the following components:      Result Value   Glucose, Bld 103 (*)    Calcium 8.7 (*)    Total Protein 6.4 (*)    All other components within normal limits  TROPONIN I (HIGH SENSITIVITY) - Abnormal; Notable for the following components:   Troponin I (High Sensitivity) 20 (*)    All other components within normal limits  CBC  TROPONIN I (HIGH SENSITIVITY)    EKG A-fib with a rate of 88 bpm.  Normal axis and intervals.  No clear signs of acute ischemia.  RADIOLOGY CXR interpreted by me without evidence of acute cardiopulmonary pathology.  Official radiology report(s): DG Chest 2 View Result Date: 10/31/2023 CLINICAL DATA:  Chest pain EXAM: CHEST - 2 VIEW COMPARISON:  10/29/2023 FINDINGS: Lungs are clear. No pneumothorax or pleural effusion. Coronary artery bypass grafting has been performed. Cardiac size within normal limits. Pulmonary vascularity  is normal. No acute bone abnormality. IMPRESSION: 1. No active cardiopulmonary disease. Electronically Signed   By: Helyn Numbers M.D.   On: 10/31/2023 01:33    PROCEDURES and INTERVENTIONS:  .1-3 Lead EKG Interpretation  Performed by: Delton Prairie, MD Authorized by: Delton Prairie, MD     Interpretation: normal     ECG rate:  70   ECG rate assessment: normal     Rhythm: sinus rhythm     Ectopy: none     Conduction: normal     Medications  oxyCODONE (Oxy IR/ROXICODONE) immediate release tablet 10 mg (10 mg Oral Given 10/31/23 0410)  acetaminophen (TYLENOL) tablet 1,000 mg (1,000 mg Oral Given 10/31/23 0410)     IMPRESSION / MDM / ASSESSMENT AND PLAN / ED COURSE  I reviewed the triage vital signs and the nursing notes.  Differential diagnosis includes, but is not limited to, ACS, PTX, PNA, muscle strain/spasm, PE, dissection, anxiety, pleural effusion  {Patient presents with  symptoms of an acute illness or injury that is potentially life-threatening.  Patient presents with chronic atypical chest discomfort without changes to his workup and suitable for continued outpatient management.  EKG is nonischemic and has 2 flat troponins that are marginally elevated.  Clear CXR, CBC and metabolic panel.  I considered admission for this patient but he has been extensively evaluated and I believe suitable for outpatient management at this point.  Discussed cardiology follow-up and close return precautions      FINAL CLINICAL IMPRESSION(S) / ED DIAGNOSES   Final diagnoses:  Other chest pain  Chronic pain syndrome     Rx / DC Orders   ED Discharge Orders     None        Note:  This document was prepared using Dragon voice recognition software and may include unintentional dictation errors.   Delton Prairie, MD 10/31/23 [phone]

## 2023-10-31 NOTE — ED Triage Notes (Addendum)
BIB [facility] from home. C/o chest pain that continued after discharge from here today. Pt took 324 asa and 3 nitroglycerin prior to EMS arrival.   168/108 HR 86 95% RA CBG 150  20G LFA by EMS  Pt reports he has an appointment with his cardiologist next week. Pt is alert and oriented. Breathing unlabored speaking in full sentences. Symmetric chest rise and fall.

## 2023-11-01 ENCOUNTER — Telehealth: Payer: Self-pay | Admitting: Family

## 2023-11-01 ENCOUNTER — Encounter: Payer: Medicare HMO | Admitting: Family

## 2023-11-01 NOTE — Telephone Encounter (Signed)
Patient did not show for his [facility] appointment on 11/01/23.

## 2023-11-02 DIAGNOSIS — J4489 Other specified chronic obstructive pulmonary disease: Secondary | ICD-10-CM | POA: Diagnosis not present

## 2023-11-02 DIAGNOSIS — K2970 Gastritis, unspecified, without bleeding: Secondary | ICD-10-CM | POA: Diagnosis not present

## 2023-11-02 DIAGNOSIS — I480 Paroxysmal atrial fibrillation: Secondary | ICD-10-CM | POA: Diagnosis not present

## 2023-11-02 DIAGNOSIS — R0789 Other chest pain: Secondary | ICD-10-CM | POA: Diagnosis not present

## 2023-11-02 DIAGNOSIS — I2510 Atherosclerotic heart disease of native coronary artery without angina pectoris: Secondary | ICD-10-CM | POA: Diagnosis not present

## 2023-11-02 DIAGNOSIS — R509 Fever, unspecified: Secondary | ICD-10-CM | POA: Diagnosis not present

## 2023-11-02 DIAGNOSIS — R0602 Shortness of breath: Secondary | ICD-10-CM | POA: Diagnosis not present

## 2023-11-02 DIAGNOSIS — N179 Acute kidney failure, unspecified: Secondary | ICD-10-CM | POA: Diagnosis not present

## 2023-11-02 DIAGNOSIS — J9811 Atelectasis: Secondary | ICD-10-CM | POA: Diagnosis not present

## 2023-11-02 DIAGNOSIS — J449 Chronic obstructive pulmonary disease, unspecified: Secondary | ICD-10-CM | POA: Diagnosis not present

## 2023-11-02 DIAGNOSIS — M6281 Muscle weakness (generalized): Secondary | ICD-10-CM

## 2023-11-02 DIAGNOSIS — J9611 Chronic respiratory failure with hypoxia: Secondary | ICD-10-CM | POA: Diagnosis not present

## 2023-11-02 DIAGNOSIS — M25552 Pain in left hip: Secondary | ICD-10-CM

## 2023-11-02 DIAGNOSIS — G8929 Other chronic pain: Secondary | ICD-10-CM

## 2023-11-02 DIAGNOSIS — I5042 Chronic combined systolic (congestive) and diastolic (congestive) heart failure: Secondary | ICD-10-CM | POA: Diagnosis not present

## 2023-11-02 DIAGNOSIS — E785 Hyperlipidemia, unspecified: Secondary | ICD-10-CM | POA: Diagnosis not present

## 2023-11-02 DIAGNOSIS — I959 Hypotension, unspecified: Secondary | ICD-10-CM | POA: Diagnosis not present

## 2023-11-02 DIAGNOSIS — E119 Type 2 diabetes mellitus without complications: Secondary | ICD-10-CM | POA: Diagnosis not present

## 2023-11-02 DIAGNOSIS — I110 Hypertensive heart disease with heart failure: Secondary | ICD-10-CM | POA: Diagnosis not present

## 2023-11-02 DIAGNOSIS — R059 Cough, unspecified: Secondary | ICD-10-CM | POA: Diagnosis not present

## 2023-11-02 DIAGNOSIS — R079 Chest pain, unspecified: Secondary | ICD-10-CM | POA: Diagnosis not present

## 2023-11-03 DIAGNOSIS — I255 Ischemic cardiomyopathy: Secondary | ICD-10-CM | POA: Diagnosis not present

## 2023-11-03 DIAGNOSIS — I160 Hypertensive urgency: Secondary | ICD-10-CM | POA: Diagnosis not present

## 2023-11-03 DIAGNOSIS — N179 Acute kidney failure, unspecified: Secondary | ICD-10-CM | POA: Diagnosis not present

## 2023-11-03 DIAGNOSIS — R079 Chest pain, unspecified: Secondary | ICD-10-CM | POA: Diagnosis not present

## 2023-11-03 DIAGNOSIS — R0789 Other chest pain: Secondary | ICD-10-CM | POA: Diagnosis not present

## 2023-11-03 DIAGNOSIS — I480 Paroxysmal atrial fibrillation: Secondary | ICD-10-CM | POA: Diagnosis not present

## 2023-11-03 DIAGNOSIS — I25118 Atherosclerotic heart disease of native coronary artery with other forms of angina pectoris: Secondary | ICD-10-CM | POA: Diagnosis not present

## 2023-11-03 DIAGNOSIS — I5020 Unspecified systolic (congestive) heart failure: Secondary | ICD-10-CM | POA: Diagnosis not present

## 2023-11-03 DIAGNOSIS — I10 Essential (primary) hypertension: Secondary | ICD-10-CM | POA: Diagnosis not present

## 2023-11-04 ENCOUNTER — Other Ambulatory Visit: Payer: Self-pay | Admitting: Family Medicine

## 2023-11-04 DIAGNOSIS — I25118 Atherosclerotic heart disease of native coronary artery with other forms of angina pectoris: Secondary | ICD-10-CM | POA: Diagnosis not present

## 2023-11-04 DIAGNOSIS — R079 Chest pain, unspecified: Secondary | ICD-10-CM | POA: Diagnosis not present

## 2023-11-04 DIAGNOSIS — I160 Hypertensive urgency: Secondary | ICD-10-CM | POA: Diagnosis not present

## 2023-11-04 DIAGNOSIS — R0600 Dyspnea, unspecified: Secondary | ICD-10-CM | POA: Diagnosis not present

## 2023-11-04 DIAGNOSIS — Z515 Encounter for palliative care: Secondary | ICD-10-CM | POA: Diagnosis not present

## 2023-11-04 DIAGNOSIS — I5020 Unspecified systolic (congestive) heart failure: Secondary | ICD-10-CM | POA: Diagnosis not present

## 2023-11-04 DIAGNOSIS — R0789 Other chest pain: Secondary | ICD-10-CM | POA: Diagnosis not present

## 2023-11-04 DIAGNOSIS — I480 Paroxysmal atrial fibrillation: Secondary | ICD-10-CM | POA: Diagnosis not present

## 2023-11-04 DIAGNOSIS — Z66 Do not resuscitate: Secondary | ICD-10-CM | POA: Diagnosis not present

## 2023-11-08 DIAGNOSIS — Z7189 Other specified counseling: Secondary | ICD-10-CM | POA: Diagnosis not present

## 2023-11-08 DIAGNOSIS — Z951 Presence of aortocoronary bypass graft: Secondary | ICD-10-CM | POA: Diagnosis not present

## 2023-11-08 DIAGNOSIS — I2510 Atherosclerotic heart disease of native coronary artery without angina pectoris: Secondary | ICD-10-CM | POA: Diagnosis not present

## 2023-11-08 DIAGNOSIS — I4891 Unspecified atrial fibrillation: Secondary | ICD-10-CM | POA: Diagnosis not present

## 2023-11-08 DIAGNOSIS — R079 Chest pain, unspecified: Secondary | ICD-10-CM | POA: Diagnosis not present

## 2023-11-08 DIAGNOSIS — E7849 Other hyperlipidemia: Secondary | ICD-10-CM | POA: Diagnosis not present

## 2023-11-08 DIAGNOSIS — I10 Essential (primary) hypertension: Secondary | ICD-10-CM | POA: Diagnosis not present

## 2023-11-11 ENCOUNTER — Emergency Department: Payer: Medicare HMO

## 2023-11-11 ENCOUNTER — Other Ambulatory Visit: Payer: Self-pay

## 2023-11-11 ENCOUNTER — Emergency Department
Admission: EM | Admit: 2023-11-11 | Discharge: 2023-11-11 | Disposition: A | Discharge status: Home / Self Care | Payer: Medicare HMO | Attending: Emergency Medicine | Admitting: Emergency Medicine

## 2023-11-11 DIAGNOSIS — I4891 Unspecified atrial fibrillation: Secondary | ICD-10-CM | POA: Diagnosis not present

## 2023-11-11 DIAGNOSIS — R918 Other nonspecific abnormal finding of lung field: Secondary | ICD-10-CM | POA: Diagnosis not present

## 2023-11-11 DIAGNOSIS — R079 Chest pain, unspecified: Secondary | ICD-10-CM | POA: Insufficient documentation

## 2023-11-11 DIAGNOSIS — R0602 Shortness of breath: Secondary | ICD-10-CM | POA: Diagnosis not present

## 2023-11-11 DIAGNOSIS — I700 Atherosclerosis of aorta: Secondary | ICD-10-CM | POA: Diagnosis not present

## 2023-11-11 DIAGNOSIS — R0789 Other chest pain: Secondary | ICD-10-CM | POA: Diagnosis not present

## 2023-11-11 DIAGNOSIS — J449 Chronic obstructive pulmonary disease, unspecified: Secondary | ICD-10-CM | POA: Diagnosis not present

## 2023-11-11 LAB — CBC
HCT: 56.5 % — ABNORMAL HIGH (ref 39.0–52.0)
Hemoglobin: 17.9 g/dL — ABNORMAL HIGH (ref 13.0–17.0)
MCH: 28.8 pg (ref 26.0–34.0)
MCHC: 31.7 g/dL (ref 30.0–36.0)
MCV: 90.8 fL (ref 80.0–100.0)
Platelets: 164 10*3/uL (ref 150–400)
RBC: 6.22 MIL/uL — ABNORMAL HIGH (ref 4.22–5.81)
RDW: 14 % (ref 11.5–15.5)
WBC: 6.5 10*3/uL (ref 4.0–10.5)
nRBC: 0 % (ref 0.0–0.2)

## 2023-11-11 LAB — URINALYSIS, ROUTINE W REFLEX MICROSCOPIC
Bacteria, UA: NONE SEEN
Bilirubin Urine: NEGATIVE
Glucose, UA: >=500 mg/dL — AB
Hgb urine dipstick: NEGATIVE
Ketones, ur: NEGATIVE mg/dL
Leukocytes,Ua: NEGATIVE
Nitrite: NEGATIVE
Protein, ur: NEGATIVE mg/dL
RBC / HPF: 0 RBC/hpf (ref 0–5)
Specific Gravity, Urine: 1.008 (ref 1.005–1.030)
Squamous Epithelial / HPF: 0 /[HPF] (ref 0–5)
pH: 6 (ref 5.0–8.0)

## 2023-11-11 LAB — COMPREHENSIVE METABOLIC PANEL
ALT: 19 U/L (ref 0–44)
AST: 23 U/L (ref 15–41)
Albumin: 4 g/dL (ref 3.5–5.0)
Alkaline Phosphatase: 72 U/L (ref 38–126)
Anion gap: 9 (ref 5–15)
BUN: 16 mg/dL (ref 8–23)
CO2: 28 mmol/L (ref 22–32)
Calcium: 9.1 mg/dL (ref 8.9–10.3)
Chloride: 100 mmol/L (ref 98–111)
Creatinine, Ser: 0.84 mg/dL (ref 0.61–1.24)
GFR, Estimated: >60 mL/min (ref >60)
Glucose, Bld: 106 mg/dL — ABNORMAL HIGH (ref 70–99)
Potassium: 3.9 mmol/L (ref 3.5–5.1)
Sodium: 137 mmol/L (ref 135–145)
Total Bilirubin: 0.7 mg/dL (ref <1.2)
Total Protein: 7.3 g/dL (ref 6.5–8.1)

## 2023-11-11 LAB — TROPONIN I (HIGH SENSITIVITY)
Troponin I (High Sensitivity): 17 ng/L (ref <18)
Troponin I (High Sensitivity): 20 ng/L — ABNORMAL HIGH (ref <18)

## 2023-11-11 MED ORDER — OXYCODONE-ACETAMINOPHEN 5-325 MG PO TABS
1.0000 | ORAL_TABLET | Freq: Once | ORAL | Status: DC
  Filled 2023-11-11: qty 1

## 2023-11-11 MED ORDER — OXYCODONE HCL 5 MG PO TABS
5.0000 mg | ORAL_TABLET | Freq: Once | ORAL | Status: DC
  Filled 2023-11-11: qty 1

## 2023-11-11 NOTE — ED Notes (Addendum)
Pt given resources for ride home, given paper scrubs per request. Grandson at home aware of pt d/c, ambulatory w/ steady gait PTD.

## 2023-11-11 NOTE — ED Triage Notes (Signed)
Pt brought in by EMS from home, initial call was confusion but pt presently co chest pain. Pt awake and alert to self. cbg 208,. ASA 324 mg per EMS.

## 2023-11-11 NOTE — ED Provider Notes (Signed)
[facility] Provider Note    Event Date/Time   First MD Initiated Contact with Patient 11/11/23 1516     (approximate)   History   Altered Mental Status   HPI  Lawrence [name] is a 67 y.o. male who presents to the emergency department today with primary concern for chest pain.  The patient states that it was present when he woke up.  Located in the center part of his chest.  Has been persistent throughout the day.  Has been seen for this chest pain in the past and recently had admission to [facility].  Per discharge summary dated 11/04/23:  "Atypical Chest Pain Patient presented reporting 10/10 central chest pain. He had a negative work-up for ACS this admission. He had a recent LHC in October with no acute obstructive lesions. Could have been precipitated by hypertension at admission, but continued to report ongoing pain even with improvement in BP. Cardiology was consulted who recommended additional optimization of his regimen, but did not feel his presenting pain was cardiac in origin. With obtaining additional history over the course of his hospitalization and serial exams, his pain seems to be more epigastric than chest pain. He has pain reproducible with palpation of the abdomen. He had a recent CT of the abdomen which showed no acute findings. His labs including CBC, CMP, and lipase were all unremarkable. He does have a history of GERD and had not been taking a PPI. Through chart review, it appears that he has a prior history of GI bleed, but reassuringly has not had any clinical sings or symptoms of GI bleed this admission. He was started on a PPI as well as short term sucralfate to help alleviate his pain. Overall, his health care literacy is poor. He continued to attribute his pain to his heart and asked for escalating doses of opiates. It is difficult at times to ascertain the causes of his pain. While he certainly does have a significant cardiac history, would caution  repeated cardiac work-ups for presentations for this chronic pain. "  Patient states that he hast tried multiple medications for esophageal pain without any significant relief. Additionally states that he has had some confusion as well for a while.        Physical Exam   Triage Vital Signs: ED Triage Vitals  Encounter Vitals Group     BP 11/11/23 1251 (!) 167/107     Systolic BP Percentile --      Diastolic BP Percentile --      Pulse Rate 11/11/23 1251 78     Resp 11/11/23 1251 18     Temp 11/11/23 1251 98.3 F (36.8 C)     Temp Source 11/11/23 1251 Oral     SpO2 11/11/23 1251 96 %     Weight 11/11/23 1252 185 lb (83.9 kg)     Height 11/11/23 1252 5\' 7"  (1.702 m)     Head Circumference --      Peak Flow --      Pain Score 11/11/23 1252 10     Pain Loc --      Pain Education --      Exclude from Growth Chart --     Most recent vital signs: Vitals:   11/11/23 1251  BP: (!) 167/107  Pulse: 78  Resp: 18  Temp: 98.3 F (36.8 C)  SpO2: 96%   General: Awake, alert, oriented. CV:  Good peripheral perfusion. Regular rate and rhythm. Resp:  Normal  effort. Lungs clear. Abd:  No distention.    ED Results / Procedures / Treatments   Labs (all labs ordered are listed, but only abnormal results are displayed) Labs Reviewed  CBC - Abnormal; Notable for the following components:      Result Value   RBC 6.22 (*)    Hemoglobin 17.9 (*)    HCT 56.5 (*)    All other components within normal limits  COMPREHENSIVE METABOLIC PANEL - Abnormal; Notable for the following components:   Glucose, Bld 106 (*)    All other components within normal limits  URINALYSIS, ROUTINE W REFLEX MICROSCOPIC - Abnormal; Notable for the following components:   Color, Urine STRAW (*)    APPearance CLEAR (*)    Glucose, UA >=500 (*)    All other components within normal limits  TROPONIN I (HIGH SENSITIVITY) - Abnormal; Notable for the following components:   Troponin I (High Sensitivity) 20 (*)     All other components within normal limits  TROPONIN I (HIGH SENSITIVITY)     EKG  I, Phineas Semen, attending physician, personally viewed and interpreted this EKG  EKG Time: 1255 Rate: 76 Rhythm: atrial fibrillation Axis: normal Intervals: qtc 441 QRS: narrow, q waves v1 ST changes: no st elevation Impression: abnormal ekg   RADIOLOGY I independently interpreted and visualized the CXR. My interpretation: No pneumonia Radiology interpretation:  IMPRESSION:  Findings suggest COPD.  Enlarged cardiac silhouette.      PROCEDURES:  Critical Care performed: No    MEDICATIONS ORDERED IN ED: Medications - No data to display   IMPRESSION / MDM / ASSESSMENT AND PLAN / ED COURSE  I reviewed the triage vital signs and the nursing notes.                              Differential diagnosis includes, but is not limited to, chronic pain, ACS, pneumonia, esophagitis.  Patient's presentation is most consistent with acute presentation with potential threat to life or bodily function.   Patient presented to the emergency department today because of concerns for chest pain.  Does appear patient has chronic chest pain.  Had recent hospitalization for the same.  Initial troponin was negative here.  EKG without concerning ST elevation.  Will plan on repeating troponin.  Chest x-ray without concerning abnormalities.  At this time I think likely patient suffering from his chronic pain.  In terms of the confusion will check a UA.  UA without concerning findings. Second troponin without any significant change and per chart review patient had similar troponins in previous ER visit. At this time do think patient's symptoms consistent with chronic pain. Per chart review it appears patient is being set up to see pain specialist through [facility]. Will discharge to continue outpatient work up.     FINAL CLINICAL IMPRESSION(S) / ED DIAGNOSES   Final diagnoses:  Chest pain, unspecified type        Note:  This document was prepared using Dragon voice recognition software and may include unintentional dictation errors.    Phineas Semen, MD 11/11/23 [phone]

## 2023-11-11 NOTE — Discharge Instructions (Signed)
Please seek medical attention for any high fevers, chest pain, shortness of breath, change in behavior, persistent vomiting, bloody stool or any other new or concerning symptoms.  

## 2023-11-11 NOTE — ED Notes (Signed)
Patient states Percocet does not help, refused meds. Derrill Kay, MD, aware.

## 2023-11-12 ENCOUNTER — Ambulatory Visit: Payer: Self-pay | Admitting: *Deleted

## 2023-11-12 ENCOUNTER — Telehealth: Payer: Self-pay

## 2023-11-12 DIAGNOSIS — I110 Hypertensive heart disease with heart failure: Secondary | ICD-10-CM | POA: Diagnosis not present

## 2023-11-12 DIAGNOSIS — I4891 Unspecified atrial fibrillation: Secondary | ICD-10-CM | POA: Diagnosis not present

## 2023-11-12 DIAGNOSIS — R079 Chest pain, unspecified: Secondary | ICD-10-CM | POA: Diagnosis not present

## 2023-11-12 DIAGNOSIS — F419 Anxiety disorder, unspecified: Secondary | ICD-10-CM | POA: Diagnosis not present

## 2023-11-12 DIAGNOSIS — I5022 Chronic systolic (congestive) heart failure: Secondary | ICD-10-CM | POA: Diagnosis not present

## 2023-11-12 DIAGNOSIS — R9439 Abnormal result of other cardiovascular function study: Secondary | ICD-10-CM | POA: Diagnosis not present

## 2023-11-12 DIAGNOSIS — E1159 Type 2 diabetes mellitus with other circulatory complications: Secondary | ICD-10-CM | POA: Diagnosis not present

## 2023-11-12 DIAGNOSIS — R1110 Vomiting, unspecified: Secondary | ICD-10-CM | POA: Diagnosis not present

## 2023-11-12 DIAGNOSIS — R0602 Shortness of breath: Secondary | ICD-10-CM | POA: Diagnosis not present

## 2023-11-12 DIAGNOSIS — I4819 Other persistent atrial fibrillation: Secondary | ICD-10-CM | POA: Diagnosis not present

## 2023-11-12 DIAGNOSIS — I5032 Chronic diastolic (congestive) heart failure: Secondary | ICD-10-CM | POA: Diagnosis not present

## 2023-11-12 DIAGNOSIS — I255 Ischemic cardiomyopathy: Secondary | ICD-10-CM | POA: Diagnosis not present

## 2023-11-12 DIAGNOSIS — E785 Hyperlipidemia, unspecified: Secondary | ICD-10-CM | POA: Diagnosis not present

## 2023-11-12 DIAGNOSIS — Z66 Do not resuscitate: Secondary | ICD-10-CM | POA: Diagnosis not present

## 2023-11-12 DIAGNOSIS — I10 Essential (primary) hypertension: Secondary | ICD-10-CM | POA: Diagnosis not present

## 2023-11-12 DIAGNOSIS — J449 Chronic obstructive pulmonary disease, unspecified: Secondary | ICD-10-CM | POA: Diagnosis not present

## 2023-11-12 DIAGNOSIS — I214 Non-ST elevation (NSTEMI) myocardial infarction: Secondary | ICD-10-CM | POA: Diagnosis not present

## 2023-11-12 DIAGNOSIS — R0789 Other chest pain: Secondary | ICD-10-CM | POA: Diagnosis not present

## 2023-11-12 DIAGNOSIS — D751 Secondary polycythemia: Secondary | ICD-10-CM | POA: Diagnosis not present

## 2023-11-12 DIAGNOSIS — I2510 Atherosclerotic heart disease of native coronary artery without angina pectoris: Secondary | ICD-10-CM | POA: Diagnosis not present

## 2023-11-12 NOTE — Transitions of Care (Post Inpatient/ED Visit) (Unsigned)
   11/12/2023  Name: Lawrence [name] MRN: [id number] DOB: 06-Aug-1956  Today's TOC FU Call Status: Today's TOC FU Call Status:: Unsuccessful Call (1st Attempt) Unsuccessful Call (1st Attempt) Date: 11/12/23  Attempted to reach the patient regarding the most recent Inpatient/ED visit.  Follow Up Plan: Additional outreach attempts will be made to reach the patient to complete the Transitions of Care (Post Inpatient/ED visit) call.    Jarika Robben, CMA  [facility] AWV Team Direct Dial: [phone]

## 2023-11-12 NOTE — Patient Outreach (Signed)
  Care Coordination   Follow Up Visit Note   11/12/2023 Name: Lawrence [name] MRN: [id number] DOB: 12-Nov-1956  Lawrence [name] is a 67 y.o. year old male who sees [name], Demetrios Isaacs, MD for primary care. I spoke with  Lawrence [name] and his grandson by phone today.  What matters to the patients [name] and wellness today?  Mental [name] support, resumption of HH services.    Goals Addressed             This Visit's Progress    Care Coordination Activities       Activities and task to complete in order to accomplish goals.   CSW will refer to [facility] expect a call from Lawrence [name] to discuss a start date Keep all upcoming appointment discussed today-please ensure that you are coordinating with medicaid for transportation to all follow up appointments Self Support options  (grandson to assist with ongoing mental [name] support through Lawrence [name]) CSW to collaborate with providers office regarding Lawrence [name] PT            SDOH assessments and interventions completed:  No     Care Coordination Interventions:  Yes, provided  Interventions Today    Flowsheet Row Most Recent Value  General Interventions   General Interventions Discussed/Reviewed General Interventions Reviewed  [comfirmed recent visit to ED for confusion and chest pain. Grandson confirms that PT came out to assess pt, was doing well at time of visit and stated he no longer needed PT, would like PT to return due to symptoms worsening]  Doctor Visits Discussed/Reviewed --  [Patient seen by Dr. Nedra Hai on 12/23, per grandson pt  did not qualify for a pacemaker]  Mental [name] Interventions   Mental [name] Discussed/Reviewed Mental [name] Discussed, Mental [name] Reviewed  [facility] assesses, recommended follow up with a psychiatrist,  grandson will assist with coordination to [name] in [place], Lawrence [name] for [facility] support and follow up]       Follow up plan: Follow up call scheduled for 11/19/23     Encounter Outcome:  Patient Visit Completed

## 2023-11-12 NOTE — Patient Instructions (Signed)
Visit Information  Thank you for taking time to visit with me today. Please don't hesitate to contact me if I can be of assistance to you.   Following are the goals we discussed today:   Goals Addressed             This Visit's Progress    Care Coordination Activities       Activities and task to complete in order to accomplish goals.   CSW will refer to [facility] expect a call from Charlie Pitter to discuss a start date Keep all upcoming appointment discussed today-please ensure that you are coordinating with medicaid for transportation to all follow up appointments Self Support options  (grandson to assist with ongoing mental health support through [facility]) CSW to collaborate with providers office regarding [facility] PT            Our next appointment is by telephone on 11/19/23 at 9am  Please call the care guide team at [phone] if you need to cancel or reschedule your appointment.   If you are experiencing a Mental Health or Behavioral Health Crisis or need someone to talk to, please call [phone]   Patient verbalizes understanding of instructions and care plan provided today and agrees to view in MyChart. Active MyChart status and patient understanding of how to access instructions and care plan via MyChart confirmed with patient.     Telephone follow up appointment with care management team member scheduled for: 11/19/23  Verna Czech, LCSW [place]  [facility], [facility] Licensed Clinical Social Worker Care Coordinator  Direct Dial: [phone]

## 2023-11-16 ENCOUNTER — Telehealth: Payer: Self-pay | Admitting: *Deleted

## 2023-11-16 ENCOUNTER — Other Ambulatory Visit: Payer: Self-pay

## 2023-11-16 NOTE — Transitions of Care (Post Inpatient/ED Visit) (Signed)
   11/16/2023  Name: Lawrence [name] MRN: [id number] DOB: [birth year]  Today's TOC FU Call Status: Today's TOC FU Call Status:: Unsuccessful Call (1st Attempt) Unsuccessful Call (1st Attempt) Date: 11/16/23  Attempted to reach the patient regarding the most recent Inpatient/ED visit.  Follow Up Plan: Additional outreach attempts will be made to reach the patient to complete the Transitions of Care (Post Inpatient/ED visit) call.   Cathlean Headland BSN RN Population Health- Transition of Care Team.  [facility] [phone]

## 2023-11-16 NOTE — Transitions of Care (Post Inpatient/ED Visit) (Signed)
   11/16/2023  Name: Lawrence [name] MRN: [id number] DOB: 02-22-1956  Today's TOC FU Call Status: Today's TOC FU Call Status:: Unsuccessful Call (2nd Attempt) Unsuccessful Call (2nd Attempt) Date: 11/16/23  Attempted to reach the patient regarding the most recent Inpatient/ED visit.  Follow Up Plan: Additional outreach attempts will be made to reach the patient to complete the Transitions of Care (Post Inpatient/ED visit) call.   Cathlean Headland BSN RN Population Health- Transition of Care Team.  [facility] [phone]

## 2023-11-16 NOTE — Transitions of Care (Post Inpatient/ED Visit) (Signed)
 11/16/2023  Name: Lawrence [name] MRN: [id number] DOB: 11-Oct-1956  Today's TOC FU Call Status: Today's TOC FU Call Status:: Successful TOC FU Call Completed Unsuccessful Call (1st Attempt) Date: 11/12/23 [facility] FU Call Complete Date: 11/16/23 Patient's Name and Date of Birth confirmed.  Transition Care Management Follow-up Telephone Call Date of Discharge: 11/11/23 Discharge Facility: [facility]) Type of Discharge: Emergency Department Reason for ED Visit: Cardiac Conditions Cardiac Conditions Diagnosis: Chest Pain Persisting How have you been since you were released from the hospital?: Better Any questions or concerns?: No  Items Reviewed: Did you receive and understand the discharge instructions provided?: Yes Medications obtained,verified, and reconciled?: Yes (Medications Reviewed) Any new allergies since your discharge?: No Dietary orders reviewed?: NA Do you have support at home?: Yes People in Home: grandchild(ren)  Medications Reviewed Today: Medications Reviewed Today     Reviewed by Masiah Woody, [place] LABOR, CMA (Certified Medical Assistant) on 11/16/23 at 1106  Med List Status: <None>   Medication Order Taking? Sig Documenting Provider Last Dose Status Informant  Accu-Chek Softclix Lancets lancets [id number] No Use as instructed to check sugar daily for type 2 diabetes. [name], MD Taking Active Pharmacy Records, Other  ALPRAZolam  (XANAX ) 1 MG tablet [id number] No Take 1 tablet (1 mg total) by mouth 3 (three) times daily as needed. [name], MD prn prn Active Pharmacy Records, Other  apixaban  (ELIQUIS ) 5 MG TABS tablet [id number] No Take 1 tablet (5 mg total) by mouth 2 (two) times daily. [name], MD 10/18/2023 am Active Pharmacy Records, Other  atorvastatin  (LIPITOR ) 80 MG tablet [id number] No TAKE 1 TABLET BY MOUTH AT BEDTIME [name], MD Unknown Unknown Active Pharmacy Records, Other  Blood Glucose Calibration  (ACCU-CHEK GUIDE CONTROL) LIQD [id number] No Use with blood glucose monitor as directed [name], Nancyann BRAVO, MD Taking Active Pharmacy Records, Other  Blood Glucose Monitoring Suppl (ACCU-CHEK GUIDE) w/Device KIT [id number] No Use to check blood sugars as directed [name], MD Taking Active Pharmacy Records, Other  Budeson-Glycopyrrol-Formoterol  (BREZTRI  AEROSPHERE) 160-9-4.8 MCG/ACT AERO [id number] No Inhale 2 puffs into the lungs 2 (two) times daily. [name], MD Unknown Unknown Active Pharmacy Records, Other  carvedilol  (COREG ) 3.125 MG tablet [id number] No Take 3.125 mg by mouth 2 (two) times daily with a meal. Jama Margery ORN, MD Unknown Unknown Active Pharmacy Records, Other  cetirizine  (ZYRTEC ) 10 MG tablet [id number] No TAKE 1 TABLET BY MOUTH AT BEDTIME [name], MD Unknown Unknown Active Pharmacy Records, Other           Med Note GAYLENE DARVIN JINNY Pablo Oct 18, 2023 10:05 PM) Last filled 4D supply in June  Cyanocobalamin  (VITAMIN B-12) 1000 MCG SUBL [id number] No Place 1 tablet under the tongue daily. [provider] Unknown Unknown Active Pharmacy Records, Other  FARXIGA  10 MG TABS tablet [id number] No Take 10 mg by mouth daily. [provider] Unknown Unknown Active Pharmacy Records, Other           Med Note GAYLENE DARVIN JINNY Pablo Oct 18, 2023 10:09 PM) Last filled 08/16/23 for 30 days.   glucose blood (ACCU-CHEK GUIDE) test strip [id number] No Use as instructed to check sugar daily for type 2 diabetes. [name], MD Taking Active Pharmacy Records, Other  iron  polysaccharides [facility] 150) 150 MG capsule [id number] No TAKE 1 CAPSULE BY MOUTH DAILY [name], Nancyann BRAVO, MD Unknown Unknown Active Pharmacy Records, Other  isosorbide  mononitrate (IMDUR )  60 MG 24 hr tablet [id number]  Take 1 tablet (60 mg total) by mouth daily. [name], MD  Active   lidocaine  (LIDODERM ) 5 % [id number] No Place 1 patch onto the skin daily. Remove & Discard patch within 12 hours or as  directed by MD Caleen Qualia, MD Unknown Unknown Active Pharmacy Records, Other  metFORMIN  (GLUCOPHAGE -XR) 500 MG 24 hr tablet [id number] No Take 1 tablet (500 mg total) by mouth 2 (two) times daily.  Patient taking differently: Take 500 mg by mouth daily.   Emilio Kelly DASEN, FNP Past Week Active Pharmacy Records, Other           Med Note HALLIE, JACKSON SAILOR   Fri Oct 01, 2023  4:16 PM) Reports taking one a day  methocarbamol  (ROBAXIN ) 500 MG tablet [id number] No TAKE 1 TABLET BY MOUTH EVERY 8 HOURS AS NEEDED FOR MUSCLE SPASMS [name], MD prn prn Active Pharmacy Records, Other           Med Note ELENA, RAVEN M   Thu Aug 26, 2023  1:48 PM) Up to 4x a day  nitroGLYCERIN  (NITROSTAT ) 0.4 MG SL tablet [id number]  Place 1 tablet (0.4 mg total) under the tongue every 5 (five) minutes as needed for chest pain (Do not take if blood pressure is low, systolic BP<110). [name], MD  Active   omega-3 acid ethyl esters (LOVAZA ) 1 g capsule [id number] No TAKE 4 CAPSULES (4 GRAMS TOTAL) BY WARDEN [name], MD Unknown Unknown Active Pharmacy Records, Other  oxyCODONE -acetaminophen  (PERCOCET) 10-325 MG tablet [id number]  TAKE 1 TABLET BY MOUTH EVERY 4 HOURS AS NEEDED FOR PAIN. [name], MD  Active   OXYGEN  [id number] No Inhale 2 L into the lungs at bedtime as needed (for shortness of breath). [provider] Taking Active Pharmacy Records, Other  pantoprazole  (PROTONIX ) 40 MG tablet [id number] No Take 1 tablet (40 mg total) by mouth 2 (two) times daily for 14 days. [place], PA-C Past Week Expired 08/07/23 2359 Self, Pharmacy Records           Med Note GAYLENE DARVIN JINNY Pablo Oct 18, 2023 10:01 PM) Last filled in July for 14 days  ranolazine  (RANEXA ) 1000 MG SR tablet [id number] No Take 1 tablet (1,000 mg total) by mouth 2 (two) times daily. Darci Pore, MD Unknown Unknown Active Pharmacy Records, Other  traZODone  (DESYREL ) 100 MG tablet [id number] No Take 1 tablet (100 mg  total) by mouth at bedtime. Tobie Calix, MD Unknown Unknown Active Pharmacy Records, Other  venlafaxine  XR (EFFEXOR -XR) 75 MG 24 hr capsule [id number] No Take 75 mg by mouth daily with breakfast. [provider] Unknown Unknown Active Pharmacy Records, Other            Home Care and Equipment/Supplies: Were Home Health Services Ordered?: NA Any new equipment or medical supplies ordered?: NA  Functional Questionnaire: Do you need assistance with bathing/showering or dressing?: No Do you need assistance with meal preparation?: No Do you need assistance with eating?: No Do you need assistance with getting out of bed/getting out of a chair/moving?: No Do you have difficulty managing or taking your medications?: No  Follow up appointments reviewed: PCP Follow-up appointment confirmed?: Yes Date of PCP follow-up appointment?: 11/22/23 Follow-up Provider: D. Gasper, MD Specialist Hospital Follow-up appointment confirmed?: NA Do you need transportation to your follow-up appointment?: No Do you understand care options if your condition(s) worsen?: Yes-patient verbalized understanding    Tilden Broz, CMA  [facility] AWV Team Direct Dial: [phone]

## 2023-11-18 ENCOUNTER — Telehealth: Payer: Self-pay

## 2023-11-18 DIAGNOSIS — I110 Hypertensive heart disease with heart failure: Secondary | ICD-10-CM | POA: Diagnosis not present

## 2023-11-18 DIAGNOSIS — I5042 Chronic combined systolic (congestive) and diastolic (congestive) heart failure: Secondary | ICD-10-CM | POA: Diagnosis not present

## 2023-11-18 DIAGNOSIS — I480 Paroxysmal atrial fibrillation: Secondary | ICD-10-CM | POA: Diagnosis not present

## 2023-11-18 DIAGNOSIS — I2510 Atherosclerotic heart disease of native coronary artery without angina pectoris: Secondary | ICD-10-CM | POA: Diagnosis not present

## 2023-11-18 DIAGNOSIS — I959 Hypotension, unspecified: Secondary | ICD-10-CM | POA: Diagnosis not present

## 2023-11-18 DIAGNOSIS — J4489 Other specified chronic obstructive pulmonary disease: Secondary | ICD-10-CM | POA: Diagnosis not present

## 2023-11-18 DIAGNOSIS — E119 Type 2 diabetes mellitus without complications: Secondary | ICD-10-CM | POA: Diagnosis not present

## 2023-11-18 DIAGNOSIS — E785 Hyperlipidemia, unspecified: Secondary | ICD-10-CM | POA: Diagnosis not present

## 2023-11-18 DIAGNOSIS — J9611 Chronic respiratory failure with hypoxia: Secondary | ICD-10-CM | POA: Diagnosis not present

## 2023-11-18 NOTE — Transitions of Care (Post Inpatient/ED Visit) (Signed)
 11/18/2023  Name: Lawrence [name] MRN: [id number] DOB: 09/06/1956  Today's TOC FU Call Status: Today's TOC FU Call Status:: Successful TOC FU Call Completed TOC FU Call Complete Date: 11/18/23 Patient's Name and Date of Birth confirmed.  Transition Care Management Follow-up Telephone Call Date of Discharge: 11/15/23 Discharge Facility: Other ([facility]) Name of Other ([facility]) Discharge Facility: [facility] Type of Discharge: Inpatient Admission Primary Inpatient Discharge Diagnosis:: chest pain,unspecified How have you been since you were released from the hospital?: Better (pt voices he is doing better-sleeping & eating good-denies any CP or cardiac sxs-states think I had stomach ulcers but its better-meds really worked-appetite good-no N&V, he has had a BM, up walking & moving around, using oxygen  prn-mainly at night) Any questions or concerns?: No  Items Reviewed: Did you receive and understand the discharge instructions provided?: Yes Medications obtained,verified, and reconciled?: Yes (Medications Reviewed) Any new allergies since your discharge?: No Dietary orders reviewed?: Yes Type of Diet Ordered:: low salt/heart healthy/carb modified Do you have support at home?: Yes People in Home: grandchild(ren) Name of Support/Comfort Primary Source: grandson lives with him  Medications Reviewed Today: Medications Reviewed Today     Reviewed by Rochel Rama Loose, RN (Registered Nurse) on 11/18/23 at 1018  Med List Status: <None>   Medication Order Taking? Sig Documenting Provider Last Dose Status Informant  Accu-Chek Softclix Lancets lancets [id number] Yes Use as instructed to check sugar daily for type 2 diabetes. [name], MD Taking Active Pharmacy Records, Other  allopurinol  (ZYLOPRIM ) 300 MG tablet [id number] Yes Take 300 mg by mouth 2 (two) times daily. [provider] Taking Active   ALPRAZolam  (XANAX ) 1 MG tablet [id number] Yes Take  1 tablet (1 mg total) by mouth 3 (three) times daily as needed. [name], MD Taking Active Pharmacy Records, Other  amLODipine  (NORVASC ) 10 MG tablet [id number] Yes Take 10 mg by mouth daily. [provider] Taking Active Self  apixaban  (ELIQUIS ) 5 MG TABS tablet [id number] Yes Take 1 tablet (5 mg total) by mouth 2 (two) times daily. [name], MD Taking Active Pharmacy Records, Other  atorvastatin  (LIPITOR ) 80 MG tablet [id number] Yes TAKE 1 TABLET BY MOUTH AT BEDTIME [name], MD Taking Active Pharmacy Records, Other  Blood Glucose Calibration (ACCU-CHEK GUIDE CONTROL) [name] [id number]  Use with blood glucose monitor as directed [name], Nancyann BRAVO, MD  Active Pharmacy Records, Other  Blood Glucose Monitoring Suppl (ACCU-CHEK GUIDE) w/Device KIT [id number]  Use to check blood sugars as directed [name], MD  Active Pharmacy Records, Other  Budeson-Glycopyrrol-Formoterol  (BREZTRI  AEROSPHERE) 160-9-4.8 MCG/ACT [name] [id number] Yes Inhale 2 puffs into the lungs 2 (two) times daily. [name], MD Taking Active Pharmacy Records, Other  carvedilol  (COREG ) 3.125 MG tablet [id number] Yes Take 3.125 mg by mouth 2 (two) times daily with a meal. Jama Margery ORN, MD Taking Active Pharmacy Records, Other  cetirizine  (ZYRTEC ) 10 MG tablet [id number] Yes TAKE 1 TABLET BY MOUTH AT BEDTIME [name], MD Taking Active Pharmacy Records, Other           Med Note (BEJOS, ANJA J   Mon Oct 18, 2023 10:05 PM) Last filled 4D supply in June  Cyanocobalamin  (VITAMIN B-12) 1000 MCG SUBL [id number]  Place 1 tablet under the tongue daily. [provider]  Active Pharmacy Records, Other  FARXIGA  10 MG TABS tablet [id number] Yes Take 10 mg by mouth daily. [provider] Taking Active Pharmacy Records, Other  Med Note GAYLENE DARVIN JINNY Pablo Oct 18, 2023 10:09 PM) Last filled 08/16/23 for 30 days.   glucose blood (ACCU-CHEK GUIDE) test strip [id number]  Use as instructed to  check sugar daily for type 2 diabetes. [name], MD  Active Pharmacy Records, Other  iron  polysaccharides [facility] 150) 150 MG capsule [id number]  TAKE 1 CAPSULE BY MOUTH DAILY [name], MD  Active Pharmacy Records, Other  isosorbide  mononitrate (IMDUR ) 60 MG 24 hr tablet [id number] Yes Take 1 tablet (60 mg total) by mouth daily. [name], MD Taking Active   lidocaine  (LIDODERM ) 5 % [id number]  Place 1 patch onto the skin daily. Remove & Discard patch within 12 hours or as directed by MD Caleen Qualia, MD  Active Pharmacy Records, Other  metFORMIN  (GLUCOPHAGE -XR) 500 MG 24 hr tablet [id number] Yes Take 1 tablet (500 mg total) by mouth 2 (two) times daily.  Patient taking differently: Take 500 mg by mouth daily.   Emilio Kelly DASEN, FNP Taking Active Pharmacy Records, Other           Med Note [facility], JACKSON SAILOR   Fri Oct 01, 2023  4:16 PM) Reports taking one a day  methocarbamol  (ROBAXIN ) 500 MG tablet [id number] Yes TAKE 1 TABLET BY MOUTH EVERY 8 HOURS AS NEEDED FOR MUSCLE SPASMS [name], MD Taking Active Pharmacy Records, Other           Med Note ELENA, ALOYSIUS HERO   Thu Aug 26, 2023  1:48 PM) Up to 4x a day  nitroGLYCERIN  (NITROSTAT ) 0.4 MG SL tablet [id number] Yes Place 1 tablet (0.4 mg total) under the tongue every 5 (five) minutes as needed for chest pain (Do not take if blood pressure is low, systolic BP<110). [name], MD Taking Active   omega-3 acid ethyl esters (LOVAZA ) 1 g capsule [id number] Yes TAKE 4 CAPSULES (4 GRAMS TOTAL) BY WARDEN [name], MD Taking Active Pharmacy Records, Other  oxyCODONE -acetaminophen  (PERCOCET) 10-325 MG tablet [id number] Yes TAKE 1 TABLET BY MOUTH EVERY 4 HOURS AS NEEDED FOR PAIN. [name], MD Taking Active   OXYGEN  [id number] Yes Inhale 2 L into the lungs at bedtime as needed (for shortness of breath). [provider] Taking Active Pharmacy Records, Other  pantoprazole  (PROTONIX ) 40 MG tablet [id number]  Take 1  tablet (40 mg total) by mouth 2 (two) times daily for 14 days. [place], PA-C  Expired 08/07/23 2359 Self, Pharmacy Records           Med Note GAYLENE DARVIN JINNY Pablo Oct 18, 2023 10:01 PM) Last filled in July for 14 days  ranolazine  (RANEXA ) 1000 MG SR tablet [id number] Yes Take 1 tablet (1,000 mg total) by mouth 2 (two) times daily. Darci Pore, MD Taking Active Pharmacy Records, Other  sacubitril -valsartan  (ENTRESTO ) 24-26 MG [id number] Yes Take 1 tablet by mouth 2 (two) times daily. [provider] Taking Active Self  sennosides-docusate sodium  (SENOKOT-S) 8.6-50 MG tablet [id number] Yes Take 1 tablet by mouth daily. [provider] Taking Active Self  spironolactone  (ALDACTONE ) 25 MG tablet [id number] Yes Take 25 mg by mouth daily. [provider] Taking Active Self  traZODone  (DESYREL ) 100 MG tablet [id number] Yes Take 1 tablet (100 mg total) by mouth at bedtime. Patel, Sona, MD Taking Active Pharmacy Records, Other  venlafaxine  XR (EFFEXOR -XR) 75 MG 24 hr capsule [id number] Yes Take 75 mg by mouth daily with breakfast. [provider] Taking Active Pharmacy Records, Other  Home Care and Equipment/Supplies: Were Home Health Services Ordered?: No Any new equipment or medical supplies ordered?: No  Functional Questionnaire: Do you need assistance with bathing/showering or dressing?: No Do you need assistance with meal preparation?: No Do you need assistance with eating?: No Do you have difficulty maintaining continence: No Do you need assistance with getting out of bed/getting out of a chair/moving?: No Do you have difficulty managing or taking your medications?: No  Follow up appointments reviewed: PCP Follow-up appointment confirmed?: Yes Date of PCP follow-up appointment?: 11/22/23 Follow-up Provider: Dr. Gasper Specialist [facility] Follow-up appointment confirmed?: NA Do you need transportation to your follow-up appointment?:  No (pt voices he uses [employer] and will call to set up transportation) Do you understand care options if your condition(s) worsen?: Yes-patient verbalized understanding  SDOH Interventions Today    Flowsheet Row Most Recent Value  SDOH Interventions   Food Insecurity Interventions Intervention Not Indicated  [pt already active with SW-working on resources- gets grocery card from [facility] plan monthly, grandson who lives with him in the process of applying for food stamps- pt states he is over income limit to apply, pt also on waiting list for Meals on Wheels]  Housing Interventions Intervention Not Indicated  Transportation Interventions Intervention Not Indicated  Utilities Interventions Intervention Not Indicated      Interventions Today    Flowsheet Row Most Recent Value  Chronic Disease   Chronic disease during today's visit Diabetes, Chronic Obstructive Pulmonary Disease (COPD), Congestive Heart Failure (CHF), Atrial Fibrillation (AFib)  General Interventions   General Interventions Discussed/Reviewed General Interventions Discussed, Durable Medical Equipment (DME), Doctor Visits  Doctor Visits Discussed/Reviewed Doctor Visits Discussed, PCP  Durable Medical Equipment (DME) Oxygen , Glucomoter, Other  [scale]  PCP/Specialist Visits Compliance with follow-up visit  Education Interventions   Education Provided Provided Education  Provided Verbal Education On Nutrition, Blood Sugar Monitoring, Medication, When to see the doctor, Other  Nutrition Interventions   Nutrition Discussed/Reviewed Nutrition Discussed, Carbohydrate meal planning, Adding fruits and vegetables, Portion sizes, Decreasing sugar intake, Increasing proteins, Decreasing fats, Decreasing salt  Pharmacy Interventions   Pharmacy Dicussed/Reviewed Pharmacy Topics Discussed, Medications and their functions  Safety Interventions   Safety Discussed/Reviewed Safety Discussed       TOC Interventions Today    Flowsheet  Row Most Recent Value  TOC Interventions   TOC Interventions Discussed/Reviewed TOC Interventions Discussed       Rama Pilling, RN,BSN,CCM RN Care Manager Transitions of Care  [place]  Direct Phone: [phone] Toll Free: [phone] Fax: [phone]

## 2023-11-19 ENCOUNTER — Ambulatory Visit: Payer: Self-pay | Admitting: *Deleted

## 2023-11-19 NOTE — Patient Outreach (Signed)
  Care Coordination   Follow Up Visit Note   11/19/2023 Name: Lawrence [name] MRN: [id number] DOB: 06/09/1956  Lawrence [name] is a 68 y.o. year old male who sees Lawrence [name], Lawrence BRAVO, Lawrence [name] for primary care. I spoke with  Lawrence [name] by phone today.  What matters to the patients health and wellness today?  Mental Health  follow up as well as Home Health PT   Goals Addressed             This Visit's Progress    Care Coordination Activities       Activities and task to complete in order to accomplish goals.   Keep all upcoming appointment discussed today-please ensure that you are coordinating with medicaid for transportation to all follow up appointments Self Support options  (grandson to continue to assist with ongoing mental health support through [facility]) CSW to collaborate with providers office and RNCM regarding HH PT            SDOH assessments and interventions completed:  No     Care Coordination Interventions:  Yes, provided  Interventions Today    Flowsheet Row Most Recent Value  Chronic Disease   Chronic disease during today's visit Chronic Obstructive Pulmonary Disease (COPD), Congestive Heart Failure (CHF), Atrial Fibrillation (AFib)  General Interventions   General Interventions Discussed/Reviewed General Interventions Reviewed, Doctor Visits, Communication with  Lawrence [name], Mental health and nutrition needs discussed]  Doctor Visits Discussed/Reviewed Doctor Visits Reviewed, PCP  [11/22/23 PCP]  PCP/Specialist Visits Compliance with follow-up visit  Communication with RN  [pt's frequent ED visits discussed as well as options for [facility] PT]  Mental Health Interventions   Mental Health Discussed/Reviewed Mental Health Reviewed, Anxiety  [confirmed with patient's grandson that he has contacted [facility] and is wating to hear back regarding an initial appt for ongoing mental health support]  Nutrition Interventions   Nutrition Discussed/Reviewed Nutrition  Discussed  [discussed referral for [employer], however per grandson patient has diet restrictions(no salt or spicy) cannot guarantee that these restrictions can be accomodated]        Follow up plan: Follow up call scheduled for 12/10/23    Encounter Outcome:  Patient Visit Completed

## 2023-11-19 NOTE — Patient Instructions (Signed)
 Visit Information  Thank you for taking time to visit with me today. Please don't hesitate to contact me if I can be of assistance to you.   Following are the goals we discussed today:   Goals Addressed             This Visit's Progress    Care Coordination Activities       Activities and task to complete in order to accomplish goals.   Keep all upcoming appointment discussed today-please ensure that you are coordinating with medicaid for transportation to all follow up appointments Self Support options  (grandson to continue to assist with ongoing mental health support through [facility]) CSW to collaborate with providers office and RNCM regarding [facility] PT            Our next appointment is by telephone on 12/09/22 at 1pm  Please call the care guide team at [phone] if you need to cancel or reschedule your appointment.   If you are experiencing a Mental Health or Behavioral Health Crisis or need someone to talk to, please call the Suicide and Crisis Lifeline: 988   Patient verbalizes understanding of instructions and care plan provided today and agrees to view in MyChart. Active MyChart status and patient understanding of how to access instructions and care plan via MyChart confirmed with patient.     Telephone follow up appointment with care management team member scheduled for: 12/09/22  Lenn Mean, LCSW [place]  [facility], [facility] Licensed Clinical Social Worker Care Coordinator  Direct Dial: [phone]

## 2023-11-19 NOTE — Telephone Encounter (Signed)
Order printed and sent to medical records to be faxed 

## 2023-11-19 NOTE — Telephone Encounter (Signed)
 This may be a duplicate  Copied from CRM [id number]. Topic: General - Other >> Nov 18, 2023  3:28 PM Macon Large wrote: Reason for CRM: Bonita Quin with [facility] requests Rx for portable oxygen machine be sent to [employer] at fax# [phone]. Cb# [phone]

## 2023-11-22 ENCOUNTER — Inpatient Hospital Stay: Payer: Medicare HMO | Admitting: Family Medicine

## 2023-11-23 ENCOUNTER — Emergency Department
Admission: EM | Admit: 2023-11-23 | Discharge: 2023-11-23 | Disposition: A | Discharge status: Home / Self Care | Payer: Medicare HMO | Attending: Emergency Medicine | Admitting: Emergency Medicine

## 2023-11-23 ENCOUNTER — Encounter: Payer: Self-pay | Admitting: Emergency Medicine

## 2023-11-23 ENCOUNTER — Emergency Department: Payer: Medicare HMO

## 2023-11-23 DIAGNOSIS — R079 Chest pain, unspecified: Secondary | ICD-10-CM | POA: Diagnosis not present

## 2023-11-23 DIAGNOSIS — I4891 Unspecified atrial fibrillation: Secondary | ICD-10-CM | POA: Insufficient documentation

## 2023-11-23 DIAGNOSIS — R1010 Upper abdominal pain, unspecified: Secondary | ICD-10-CM | POA: Diagnosis not present

## 2023-11-23 DIAGNOSIS — R0789 Other chest pain: Secondary | ICD-10-CM | POA: Diagnosis not present

## 2023-11-23 DIAGNOSIS — I509 Heart failure, unspecified: Secondary | ICD-10-CM | POA: Insufficient documentation

## 2023-11-23 DIAGNOSIS — Z7901 Long term (current) use of anticoagulants: Secondary | ICD-10-CM | POA: Insufficient documentation

## 2023-11-23 DIAGNOSIS — I2510 Atherosclerotic heart disease of native coronary artery without angina pectoris: Secondary | ICD-10-CM | POA: Diagnosis not present

## 2023-11-23 DIAGNOSIS — R000 Tachycardia, unspecified: Secondary | ICD-10-CM | POA: Diagnosis not present

## 2023-11-23 DIAGNOSIS — J449 Chronic obstructive pulmonary disease, unspecified: Secondary | ICD-10-CM | POA: Insufficient documentation

## 2023-11-23 LAB — COMPREHENSIVE METABOLIC PANEL
ALT: 11 U/L (ref 0–44)
AST: 14 U/L — ABNORMAL LOW (ref 15–41)
Albumin: 3.8 g/dL (ref 3.5–5.0)
Alkaline Phosphatase: 67 U/L (ref 38–126)
Anion gap: 12 (ref 5–15)
BUN: 10 mg/dL (ref 8–23)
CO2: 23 mmol/L (ref 22–32)
Calcium: 9.3 mg/dL (ref 8.9–10.3)
Chloride: 106 mmol/L (ref 98–111)
Creatinine, Ser: 0.97 mg/dL (ref 0.61–1.24)
GFR, Estimated: >60 mL/min (ref >60)
Glucose, Bld: 169 mg/dL — ABNORMAL HIGH (ref 70–99)
Potassium: 3.5 mmol/L (ref 3.5–5.1)
Sodium: 141 mmol/L (ref 135–145)
Total Bilirubin: 0.7 mg/dL (ref 0.0–1.2)
Total Protein: 6.9 g/dL (ref 6.5–8.1)

## 2023-11-23 LAB — CBC
HCT: 50.8 % (ref 39.0–52.0)
Hemoglobin: 16.6 g/dL (ref 13.0–17.0)
MCH: 29 pg (ref 26.0–34.0)
MCHC: 32.7 g/dL (ref 30.0–36.0)
MCV: 88.7 fL (ref 80.0–100.0)
Platelets: 188 10*3/uL (ref 150–400)
RBC: 5.73 MIL/uL (ref 4.22–5.81)
RDW: 14.2 % (ref 11.5–15.5)
WBC: 5.5 10*3/uL (ref 4.0–10.5)
nRBC: 0 % (ref 0.0–0.2)

## 2023-11-23 LAB — TROPONIN I (HIGH SENSITIVITY)
Troponin I (High Sensitivity): 9 ng/L (ref <18)
Troponin I (High Sensitivity): 12 ng/L (ref <18)

## 2023-11-23 LAB — LIPASE, BLOOD: Lipase: 38 U/L (ref 11–51)

## 2023-11-23 MED ORDER — SUCRALFATE 1 GM/10ML PO SUSP: 1.0000 g | Freq: Four times a day (QID) | ORAL | 3 refills | Status: DC

## 2023-11-23 MED ORDER — ALUM & MAG HYDROXIDE-SIMETH 200-200-20 MG/5ML PO SUSP
30.0000 mL | Freq: Once | ORAL | Status: AC
  Administered 2023-11-23: 30 mL via ORAL
  Filled 2023-11-23: qty 30

## 2023-11-23 MED ORDER — LIDOCAINE VISCOUS HCL 2 % MT SOLN
15.0000 mL | Freq: Once | OROMUCOSAL | Status: AC
  Administered 2023-11-23: 15 mL via ORAL
  Filled 2023-11-23: qty 15

## 2023-11-23 NOTE — Discharge Instructions (Signed)
 Continue taking your antacid and carafate  as I think this is going to help with your pain likely due to acid imbalance in the stomach.   Thank you for choosing us  for your health care today!  Please see your primary doctor this week for a follow up appointment.   If you have any new, worsening, or unexpected symptoms call your doctor right away or come back to the emergency department for reevaluation.  It was my pleasure to care for you today.   Ginnie EDISON Cyrena, MD

## 2023-11-23 NOTE — Progress Notes (Signed)
Established patient visit   Patient: Lawrence [name]   DOB: 09/16/1956   68 y.o. Male  MRN: [id number] Visit Date: 12/10/2023  Today's healthcare provider: [name], MD    Subjective    HPI  Discussed the use of AI scribe software for clinical note transcription with the patient, who gave verbal consent to proceed.  History of Present Illness   The patient, with a history of heart disease and sleep apnea, presented for recent ER visit for severe chest pain, rated as 10 out of 10. The patient was diagnosed with pneumonia and started on antibiotics, which have somewhat alleviated the pain. He ruled out for acute cardiac event and had CTA of chest and abdomen showing no acute abnormalities, although there was an apical lung nodule for which 3 month follow up was recommended. He states the chest pain is now dramatically better. He did see his cardiologist last month and has a follow-up appointment scheduled for the 29th.  The patient's CPAP machine, which is approximately 70 years old, has stopped working, and he has not received a replacement despite attempts to obtain one since September. The patient has not had a sleep study in the intervening years. The patient also uses oxygen, primarily when active, but the current tanks are too heavy to carry, and the patient lacks a wheeled carrier for them. The patient's grandson is currently living with him and providing assistance.       Medications: Outpatient Medications Prior to Visit  Medication Sig   Accu-Chek Softclix Lancets lancets Use as instructed to check sugar daily for type 2 diabetes.   allopurinol (ZYLOPRIM) 300 MG tablet Take 300 mg by mouth 2 (two) times daily.   ALPRAZolam (XANAX) 1 MG tablet Take 1 tablet (1 mg total) by mouth 3 (three) times daily as needed.   amLODipine (NORVASC) 10 MG tablet Take 10 mg by mouth daily.   apixaban (ELIQUIS) 5 MG TABS tablet Take 1 tablet (5 mg total) by mouth 2 (two) times daily.    atorvastatin (LIPITOR) 80 MG tablet TAKE 1 TABLET BY MOUTH AT BEDTIME   Blood Glucose Calibration (ACCU-CHEK GUIDE CONTROL) LIQD Use with blood glucose monitor as directed   Blood Glucose Monitoring Suppl (ACCU-CHEK GUIDE) w/Device KIT Use to check blood sugars as directed   Budeson-Glycopyrrol-Formoterol (BREZTRI AEROSPHERE) 160-9-4.8 MCG/ACT AERO Inhale 2 puffs into the lungs 2 (two) times daily.   carvedilol (COREG) 3.125 MG tablet Take 3.125 mg by mouth 2 (two) times daily with a meal.   cetirizine (ZYRTEC) 10 MG tablet TAKE 1 TABLET BY MOUTH AT BEDTIME   Cyanocobalamin (VITAMIN B-12) 1000 MCG SUBL Place 1 tablet under the tongue daily.   FARXIGA 10 MG TABS tablet Take 10 mg by mouth daily.   glucose blood (ACCU-CHEK GUIDE) test strip Use as instructed to check sugar daily for type 2 diabetes.   iron polysaccharides (FERREX 150) 150 MG capsule TAKE 1 CAPSULE BY MOUTH DAILY   isosorbide mononitrate (IMDUR) 60 MG 24 hr tablet Take 1 tablet (60 mg total) by mouth daily.   lidocaine (LIDODERM) 5 % Place 1 patch onto the skin daily. Remove & Discard patch within 12 hours or as directed by MD   metFORMIN (GLUCOPHAGE-XR) 500 MG 24 hr tablet Take 1 tablet (500 mg total) by mouth 2 (two) times daily. (Patient taking differently: Take 500 mg by mouth daily.)   methocarbamol (ROBAXIN) 500 MG tablet TAKE 1 TABLET BY MOUTH EVERY 8 HOURS  AS NEEDED FOR MUSCLE SPASMS   nitroGLYCERIN (NITROSTAT) 0.4 MG SL tablet Place 1 tablet (0.4 mg total) under the tongue every 5 (five) minutes as needed for chest pain (Do not take if blood pressure is low, systolic BP<110).   omega-3 acid ethyl esters (LOVAZA) 1 g capsule TAKE 4 CAPSULES (4 GRAMS TOTAL) BY MOUTHDAILY   oxyCODONE-acetaminophen (PERCOCET) 10-325 MG tablet TAKE 1 TABLET BY MOUTH EVERY 4 HOURS AS NEEDED FOR PAIN.   OXYGEN Inhale 2 L into the lungs at bedtime as needed (for shortness of breath).   pantoprazole (PROTONIX) 40 MG tablet Take 1 tablet (40 mg total)  by mouth 2 (two) times daily for 14 days.   ranolazine (RANEXA) 1000 MG SR tablet Take 1 tablet (1,000 mg total) by mouth 2 (two) times daily.   sacubitril-valsartan (ENTRESTO) 24-26 MG Take 1 tablet by mouth 2 (two) times daily.   sennosides-docusate sodium (SENOKOT-S) 8.6-50 MG tablet Take 1 tablet by mouth daily.   spironolactone (ALDACTONE) 25 MG tablet Take 25 mg by mouth daily.   sucralfate (CARAFATE) 1 GM/10ML suspension Take 10 mLs (1 g total) by mouth 4 (four) times daily.   traZODone (DESYREL) 100 MG tablet Take 1 tablet (100 mg total) by mouth at bedtime.   venlafaxine XR (EFFEXOR-XR) 75 MG 24 hr capsule Take 75 mg by mouth daily with breakfast.   No facility-administered medications prior to visit.    Review of Systems     Objective     Vitals:   12/10/23 1308 12/10/23 1321 12/10/23 1326  BP: 116/76    Pulse: 79    Resp: 16    Temp: (!) 94.1 F (34.5 C)    SpO2: 96% (!) 84% r/a ambulating 94% O2 2 lpm ambulating  Weight: 188 lb 8 oz (85.5 kg)    Height: 5\' 7"  (1.702 m)      Physical Exam   General: Appearance:    Well developed, well nourished male in no acute distress  Eyes:    PERRL, conjunctiva/corneas clear, EOM's intact       Lungs:     Clear to auscultation bilaterally, respirations unlabored  Heart:    Normal heart rate. Irregularly irregular rhythm. No murmurs, rubs, or gallops.    MS:   All extremities are intact.    Neurologic:   Awake, alert, oriented x 3. No apparent focal neurological defect.         Assessment & Plan        Chest Pain Recent ER visit due to severe chest pain. Workup including chest and abdominal scans were unremarkable. Pain has improved and is not cardiac in origin. -Continue current management.  Pneumonia Recent diagnosis of pneumonia, currently on antibiotics. -Continue antibiotics as prescribed.  Obstructive Sleep Apnea Old CPAP machine is no longer functional. No recent sleep study. -Order home sleep study to assess  current needs. -Plan to replace CPAP machine based on results of sleep study.  Chronic respiratory failure due to combined congestive heart failure and COPD - Patient required supplemental oxygen when ambulating. current tanks are too heavy to carry and he requires the use of portable oxygen concentrator  Follow-up A1c and other routine checks were done recently. Next follow-up with cardiologist scheduled for 12/15/2023. -Schedule next appointment in April 2025 to check A1c and other routine checks.           [name], MD  [facility] [phone] (phone) [phone] (fax)  [facility]

## 2023-11-23 NOTE — ED Triage Notes (Signed)
 Pt arrived via [facility] from home with c/o left sided chest pain that radiates into left shoulder as well as upper epigastric pain. Symptoms reported since 1900 without relief of pt taking 324mg  Aspirin  and 3 Nitroglycerin  tablets. Pt A&O x4 on arrival. 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

## 2023-11-23 NOTE — ED Provider Notes (Signed)
 [facility] Provider Note    Event Date/Time   First MD Initiated Contact with Patient 11/23/23 0408     (approximate)   History   Chest Pain and Abdominal Pain   HPI  Lawrence [name] is a 68 y.o. male   Past medical history of CAD, atrial fibrillation on Eliquis , CHF and COPD, GERD who presents to the emergency department with upper abdominal pain rating up through the chest.  It started tonight after laying down around 8 PM to try to go to bed.  It is nonradiating and nonexertional.  He has no acute respiratory complaints and has some mild baseline shortness of breath and cough associated with his COPD, unchanged.  He has had similar symptoms occur in the past related to his GERD, feels identical at this time.  He has been compliant with his PPI.  He denies any alcohol use or NSAID use.  He has no GI bleeding.   External Medical Documents Reviewed: Upper endoscopy from the summer 2024 showed erythematous changes to the gastric, no other abnormal findings      Physical Exam   Triage Vital Signs: ED Triage Vitals  Encounter Vitals Group     BP 11/23/23 0101 (!) 152/96     Systolic BP Percentile --      Diastolic BP Percentile --      Pulse Rate 11/23/23 0101 (!) 109     Resp 11/23/23 0101 20     Temp 11/23/23 0101 97.8 F (36.6 C)     Temp Source 11/23/23 0101 Oral     SpO2 11/23/23 0054 97 %     Weight 11/23/23 0059 182 lb (82.6 kg)     Height 11/23/23 0059 5' 7 (1.702 m)     Head Circumference --      Peak Flow --      Pain Score 11/23/23 0058 10     Pain Loc --      Pain Education --      Exclude from Growth Chart --     Most recent vital signs: Vitals:   11/23/23 0101 11/23/23 0357  BP: (!) 152/96 (!) 154/88  Pulse: (!) 109 86  Resp: 20 20  Temp: 97.8 F (36.6 C)   SpO2: 95% 98%    General: Awake, no distress.  CV:  Good peripheral perfusion.  Resp:  Normal effort.  Abd:  No distention.  Other:  Mildly hypertensive 150/80  otherwise vital signs normal no respiratory distress and clear lungs.  His radial pulses bilateral equal.  Heart sounds normal without murmurs.  Soft abdomen but he does have epigastric tenderness to palpation without rigidity or guarding.   ED Results / Procedures / Treatments   Labs (all labs ordered are listed, but only abnormal results are displayed) Labs Reviewed  COMPREHENSIVE METABOLIC PANEL - Abnormal; Notable for the following components:      Result Value   Glucose, Bld 169 (*)    AST 14 (*)    All other components within normal limits  LIPASE, BLOOD  CBC  TROPONIN I (HIGH SENSITIVITY)  TROPONIN I (HIGH SENSITIVITY)     I ordered and reviewed the above labs they are notable for cell counts electrolytes LFTs and lipase unremarkable.  Troponins have been flat x 2.  EKG  ED ECG REPORT I, Ginnie Shams, the attending physician, personally viewed and interpreted this ECG.   Date: 11/23/2023  EKG Time: 0104  Rate: 102  Rhythm: AF rvr  Axis: nl  Intervals: none  ST&T Change: no stemi  RADIOLOGY I independently reviewed and interpreted cxr and see no obvious focality or pneumothorax or free air  I also reviewed radiologist's formal read.   PROCEDURES:  Critical Care performed: No  Procedures   MEDICATIONS ORDERED IN ED: Medications  alum & mag hydroxide-simeth (MAALOX/MYLANTA) 200-200-20 MG/5ML suspension 30 mL (30 mLs Oral Given 11/23/23 0432)    And  lidocaine  (XYLOCAINE ) 2 % viscous mouth solution 15 mL (15 mLs Oral Given 11/23/23 0432)    IMPRESSION / MDM / ASSESSMENT AND PLAN / ED COURSE  I reviewed the triage vital signs and the nursing notes.                                Patient's presentation is most consistent with acute presentation with potential threat to life or bodily function.  Differential diagnosis includes, but is not limited to gerd, gastritis, biliary pathology, acs, pe, dissection   MDM: Most likely GERD with a history of the same  and epigastric tenderness rating of the chest when laying down.  However with cardiac history consider ACS, but fortunately EKG is nonischemic and he has serial troponins have been normal.  He is on Eliquis  so I doubt PE.  This is not consistent with dissection.  He has some symptom relief with antacids, will prescribe Carafate  and have him follow-up with PMD/GI as needed.       FINAL CLINICAL IMPRESSION(S) / ED DIAGNOSES   Final diagnoses:  Upper abdominal pain  Nonspecific chest pain     Rx / DC Orders   ED Discharge Orders          Ordered    sucralfate  (CARAFATE ) 1 GM/10ML suspension  4 times daily        11/23/23 0448             Note:  This document was prepared using Dragon voice recognition software and may include unintentional dictation errors.    Cyrena Mylar, MD 11/23/23 [phone]

## 2023-11-24 ENCOUNTER — Telehealth: Payer: Self-pay

## 2023-11-24 DIAGNOSIS — I5043 Acute on chronic combined systolic (congestive) and diastolic (congestive) heart failure: Secondary | ICD-10-CM

## 2023-11-24 DIAGNOSIS — J9611 Chronic respiratory failure with hypoxia: Secondary | ICD-10-CM

## 2023-11-24 NOTE — Telephone Encounter (Signed)
 Please review if the DME I have pended is correct and sign it. Once signed please let me know so that I can send a community message to [employer] about the DME thanks.

## 2023-11-24 NOTE — Telephone Encounter (Signed)
 Copied from CRM [id number]. Topic: General - Other >> Nov 24, 2023 11:03 AM Everette C wrote: Reason for CRM: Shakira with [employer] has called to request an order for a Portable Oxygen  Concentrator evaluation   The poc eval order will be needed in order to process the request of a portable oxygen  concentrator

## 2023-11-24 NOTE — Transitions of Care (Post Inpatient/ED Visit) (Signed)
 11/24/2023  Name: Lawrence [name] MRN: [id number] DOB: July 18, 1956  Today's TOC FU Call Status: Today's TOC FU Call Status:: Successful TOC FU Call Completed TOC FU Call Complete Date: 11/24/23 Patient's Name and Date of Birth confirmed.  Transition Care Management Follow-up Telephone Call Date of Discharge: 11/23/23 Discharge Facility: [facility] Type of Discharge: Emergency Department Primary Inpatient Discharge Diagnosis:: abd pain How have you been since you were released from the hospital?: Better Any questions or concerns?: No  Items Reviewed: Did you receive and understand the discharge instructions provided?: Yes Medications obtained,verified, and reconciled?: Yes (Medications Reviewed) Any new allergies since your discharge?: No Dietary orders reviewed?: Yes Do you have support at home?: Yes People in Home: grandchild(ren)  Medications Reviewed Today: Medications Reviewed Today     Reviewed by Emmitt Pan, LPN (Licensed Practical Nurse) on 11/24/23 at 1134  Med List Status: <None>   Medication Order Taking? Sig Documenting Provider Last Dose Status Informant  Accu-Chek Softclix Lancets lancets [id number] No Use as instructed to check sugar daily for type 2 diabetes. [name], MD Taking Active Pharmacy Records, Other  allopurinol  (ZYLOPRIM ) 300 MG tablet [id number] No Take 300 mg by mouth 2 (two) times daily. [provider] Taking Active   ALPRAZolam  (XANAX ) 1 MG tablet [id number] No Take 1 tablet (1 mg total) by mouth 3 (three) times daily as needed. [name], MD Taking Active Pharmacy Records, Other  amLODipine  (NORVASC ) 10 MG tablet [id number] No Take 10 mg by mouth daily. [provider] Taking Active Self  apixaban  (ELIQUIS ) 5 MG TABS tablet [id number] No Take 1 tablet (5 mg total) by mouth 2 (two) times daily. [name], MD Taking Active Pharmacy Records, Other  atorvastatin  (LIPITOR ) 80 MG tablet  [id number] No TAKE 1 TABLET BY MOUTH AT BEDTIME [name], MD Taking Active Pharmacy Records, Other  Blood Glucose Calibration (ACCU-CHEK GUIDE CONTROL) [name] [id number] No Use with blood glucose monitor as directed [name], Nancyann BRAVO, MD Taking Active Pharmacy Records, Other  Blood Glucose Monitoring Suppl (ACCU-CHEK GUIDE) w/Device KIT [id number] No Use to check blood sugars as directed [name], MD Taking Active Pharmacy Records, Other  Budeson-Glycopyrrol-Formoterol  (BREZTRI  AEROSPHERE) 160-9-4.8 MCG/ACT AERO [id number] No Inhale 2 puffs into the lungs 2 (two) times daily. [name], MD Taking Active Pharmacy Records, Other  carvedilol  (COREG ) 3.125 MG tablet [id number] No Take 3.125 mg by mouth 2 (two) times daily with a meal. Jama Margery ORN, MD Taking Active Pharmacy Records, Other  cetirizine  (ZYRTEC ) 10 MG tablet [id number] No TAKE 1 TABLET BY MOUTH AT BEDTIME [name], MD Taking Active Pharmacy Records, Other           Med Note (BEJOS, ANJA J   Mon Oct 18, 2023 10:05 PM) Last filled 4D supply in June  Cyanocobalamin  (VITAMIN B-12) 1000 MCG SUBL [id number] No Place 1 tablet under the tongue daily. [provider] Unknown Unknown Active Pharmacy Records, Other  FARXIGA  10 MG TABS tablet [id number] No Take 10 mg by mouth daily. [provider] Taking Active Pharmacy Records, Other           Med Note GAYLENE DARVIN JINNY Pablo Oct 18, 2023 10:09 PM) Last filled 08/16/23 for 30 days.   glucose blood (ACCU-CHEK GUIDE) test strip [id number] No Use as instructed to check sugar daily for type 2 diabetes. [name], MD Taking Active Pharmacy Records, Other  iron  polysaccharides (FERREX 150) 150 MG  capsule [id number] No TAKE 1 CAPSULE BY MOUTH DAILY [name], Nancyann BRAVO, MD Unknown Unknown Active Pharmacy Records, Other  isosorbide  mononitrate (IMDUR ) 60 MG 24 hr tablet [id number] No Take 1 tablet (60 mg total) by mouth daily. [name], MD Taking Active   lidocaine   (LIDODERM ) 5 % [id number] No Place 1 patch onto the skin daily. Remove & Discard patch within 12 hours or as directed by MD Caleen Qualia, MD Unknown Unknown Active Pharmacy Records, Other  metFORMIN  (GLUCOPHAGE -XR) 500 MG 24 hr tablet [id number] No Take 1 tablet (500 mg total) by mouth 2 (two) times daily.  Patient taking differently: Take 500 mg by mouth daily.   Emilio Kelly DASEN, FNP Taking Active Pharmacy Records, Other           Med Note [facility], JACKSON SAILOR   Fri Oct 01, 2023  4:16 PM) Reports taking one a day  methocarbamol  (ROBAXIN ) 500 MG tablet [id number] No TAKE 1 TABLET BY MOUTH EVERY 8 HOURS AS NEEDED FOR MUSCLE SPASMS [name], MD Taking Active Pharmacy Records, Other           Med Note ELENA, ALOYSIUS HERO   Thu Aug 26, 2023  1:48 PM) Up to 4x a day  nitroGLYCERIN  (NITROSTAT ) 0.4 MG SL tablet [id number] No Place 1 tablet (0.4 mg total) under the tongue every 5 (five) minutes as needed for chest pain (Do not take if blood pressure is low, systolic BP<110). [name], MD Taking Active   omega-3 acid ethyl esters (LOVAZA ) 1 g capsule [id number] No TAKE 4 CAPSULES (4 GRAMS TOTAL) BY WARDEN [name], MD Taking Active Pharmacy Records, Other  oxyCODONE -acetaminophen  (PERCOCET) 10-325 MG tablet [id number] No TAKE 1 TABLET BY MOUTH EVERY 4 HOURS AS NEEDED FOR PAIN. [name], MD Taking Active   OXYGEN  [id number] No Inhale 2 L into the lungs at bedtime as needed (for shortness of breath). [provider] Taking Active Pharmacy Records, Other  pantoprazole  (PROTONIX ) 40 MG tablet [id number] No Take 1 tablet (40 mg total) by mouth 2 (two) times daily for 14 days. [place], PA-C Past Week Expired 08/07/23 2359 Self, Pharmacy Records           Med Note GAYLENE DARVIN JINNY Pablo Oct 18, 2023 10:01 PM) Last filled in July for 14 days  ranolazine  (RANEXA ) 1000 MG SR tablet [id number] No Take 1 tablet (1,000 mg total) by mouth 2 (two) times daily. Darci Pore, MD Taking  Active Pharmacy Records, Other  sacubitril -valsartan  (ENTRESTO ) 24-26 MG [id number] No Take 1 tablet by mouth 2 (two) times daily. [provider] Taking Active Self  sennosides-docusate sodium  (SENOKOT-S) 8.6-50 MG tablet [id number] No Take 1 tablet by mouth daily. [provider] Taking Active Self  spironolactone  (ALDACTONE ) 25 MG tablet [id number] No Take 25 mg by mouth daily. [provider] Taking Active Self  sucralfate  (CARAFATE ) 1 GM/10ML suspension [id number]  Take 10 mLs (1 g total) by mouth 4 (four) times daily. Cyrena Mylar, MD  Active   traZODone  (DESYREL ) 100 MG tablet [id number] No Take 1 tablet (100 mg total) by mouth at bedtime. Patel, Sona, MD Taking Active Pharmacy Records, Other  venlafaxine  XR (EFFEXOR -XR) 75 MG 24 hr capsule [id number] No Take 75 mg by mouth daily with breakfast. [provider] Taking Active Pharmacy Records, Other            Home Care and Equipment/Supplies: Were Home Health Services Ordered?: NA Any new equipment or medical supplies ordered?:  NA  Functional Questionnaire: Do you need assistance with bathing/showering or dressing?: No Do you need assistance with meal preparation?: No Do you need assistance with eating?: No Do you have difficulty maintaining continence: No Do you need assistance with getting out of bed/getting out of a chair/moving?: No Do you have difficulty managing or taking your medications?: No  Follow up appointments reviewed: PCP Follow-up appointment confirmed?: Yes Date of PCP follow-up appointment?: 12/10/23 Follow-up Provider: [facility] Follow-up appointment confirmed?: NA Do you need transportation to your follow-up appointment?: No Do you understand care options if your condition(s) worsen?: Yes-patient verbalized understanding    SIGNATURE Julian Lemmings, LPN [facility] Nurse Health Advisor Direct Dial [phone]

## 2023-11-25 DIAGNOSIS — J9611 Chronic respiratory failure with hypoxia: Secondary | ICD-10-CM | POA: Diagnosis not present

## 2023-11-25 DIAGNOSIS — J4489 Other specified chronic obstructive pulmonary disease: Secondary | ICD-10-CM | POA: Diagnosis not present

## 2023-11-25 DIAGNOSIS — E119 Type 2 diabetes mellitus without complications: Secondary | ICD-10-CM | POA: Diagnosis not present

## 2023-11-25 DIAGNOSIS — K219 Gastro-esophageal reflux disease without esophagitis: Secondary | ICD-10-CM | POA: Diagnosis not present

## 2023-11-25 DIAGNOSIS — I110 Hypertensive heart disease with heart failure: Secondary | ICD-10-CM | POA: Diagnosis not present

## 2023-11-25 DIAGNOSIS — I480 Paroxysmal atrial fibrillation: Secondary | ICD-10-CM | POA: Diagnosis not present

## 2023-11-25 DIAGNOSIS — I2510 Atherosclerotic heart disease of native coronary artery without angina pectoris: Secondary | ICD-10-CM | POA: Diagnosis not present

## 2023-11-25 DIAGNOSIS — I959 Hypotension, unspecified: Secondary | ICD-10-CM | POA: Diagnosis not present

## 2023-11-25 DIAGNOSIS — I5042 Chronic combined systolic (congestive) and diastolic (congestive) heart failure: Secondary | ICD-10-CM | POA: Diagnosis not present

## 2023-11-26 ENCOUNTER — Telehealth: Payer: Self-pay

## 2023-11-26 ENCOUNTER — Other Ambulatory Visit: Payer: Self-pay

## 2023-11-26 ENCOUNTER — Ambulatory Visit: Payer: Self-pay | Admitting: *Deleted

## 2023-11-26 DIAGNOSIS — I2510 Atherosclerotic heart disease of native coronary artery without angina pectoris: Secondary | ICD-10-CM | POA: Diagnosis not present

## 2023-11-26 DIAGNOSIS — I959 Hypotension, unspecified: Secondary | ICD-10-CM | POA: Diagnosis not present

## 2023-11-26 DIAGNOSIS — E1121 Type 2 diabetes mellitus with diabetic nephropathy: Secondary | ICD-10-CM

## 2023-11-26 DIAGNOSIS — I110 Hypertensive heart disease with heart failure: Secondary | ICD-10-CM | POA: Diagnosis not present

## 2023-11-26 DIAGNOSIS — I5042 Chronic combined systolic (congestive) and diastolic (congestive) heart failure: Secondary | ICD-10-CM | POA: Diagnosis not present

## 2023-11-26 DIAGNOSIS — J4489 Other specified chronic obstructive pulmonary disease: Secondary | ICD-10-CM | POA: Diagnosis not present

## 2023-11-26 DIAGNOSIS — I480 Paroxysmal atrial fibrillation: Secondary | ICD-10-CM | POA: Diagnosis not present

## 2023-11-26 DIAGNOSIS — J9611 Chronic respiratory failure with hypoxia: Secondary | ICD-10-CM | POA: Diagnosis not present

## 2023-11-26 DIAGNOSIS — E119 Type 2 diabetes mellitus without complications: Secondary | ICD-10-CM | POA: Diagnosis not present

## 2023-11-26 DIAGNOSIS — K219 Gastro-esophageal reflux disease without esophagitis: Secondary | ICD-10-CM | POA: Diagnosis not present

## 2023-11-26 NOTE — Telephone Encounter (Signed)
 I received a fax about this earlier week. We have to document on and off oxygen at rest and at walking. I don't have any recent documentation of that so he needs to be seen in the office. He has an appointment scheduled on 1/21

## 2023-11-26 NOTE — Telephone Encounter (Signed)
  Chief Complaint: requesting orders placed for glucometer kit and CPAP machine as soon as possible per Orvin, with [facility].  Symptoms: does not have glucometer , lost after last move.  Frequency: na Pertinent Negatives: Patient denies na Disposition: [] ED /[] Urgent Care (no appt availability in office) / [] Appointment(In office/virtual)/ []  [facility]/ [] Home Care/ [] Refused Recommended Disposition /[] [facility]/ [x]  Follow-up with PCP Additional Notes:    Please advise , HH requesting new orders be placed for home glucometer kit with strips and lancets and order for CPAP machine.        Reason for Disposition  [1] Caller has URGENT medicine question about med that PCP or specialist prescribed AND [2] triager unable to answer question  Answer Assessment - Initial Assessment Questions 1. NAME of MEDICINE: What medicine(s) are you calling about?     Glucometer kit and C PAP machine 2. QUESTION: What is your question? (e.g., double dose of medicine, side effect)     [name], [facility] reports patient needs new order for glucometer and CPAP. 3. PRESCRIBER: Who prescribed the medicine? Reason: if prescribed by specialist, call should be referred to that group.     Na  4. SYMPTOMS: Do you have any symptoms? If Yes, ask: What symptoms are you having?  How bad are the symptoms (e.g., mild, moderate, severe)     Lost old glucometer after moving and needs a CPAP for home use.  5. PREGNANCY:  Is there any chance that you are pregnant? When was your last menstrual period?     na  Protocols used: Medication Question Call-A-AH

## 2023-11-29 NOTE — Patient Outreach (Signed)
  Care Management   Outreach Note   Name: Lawrence [name] MRN: [id number] DOB: 11-Jan-1956  An unsuccessful outreach attempt was made today for a scheduled Care Management visit.   Follow Up Plan:  A HIPAA compliant phone message was left for the patient providing contact information and requesting a return call.    Jackson Karoline [facility] RN Care Manager Direct Dial: [phone] Website: [email or web address]

## 2023-11-29 NOTE — Telephone Encounter (Signed)
 Jennifer with [employer] called in following up on previous request to get information for the portable oxygen concentrator. Please assist further with the previously requested information

## 2023-11-29 NOTE — Telephone Encounter (Signed)
 Lawrence [name] w/ [employer] [facility], they need updated Rx that states they need the POC eval. & liter flow as well as documentation of on and off oxygen. Needing this by 12/03/2023 or order will be denied.   Callback number to [employer] [facility]:  [phone]

## 2023-11-29 NOTE — Telephone Encounter (Signed)
 Left voice mail that we need to see him sooner, before 01/17. Dr. Gasper said ok to schedule in same day appointment. If patient calls back ok for [facility] Nurse to schedule patient in a same day time slot We need to see patient to document on and off oxygen  at rest and at walking. We don't have any recent documentation of that so he needs to be seen in the office.

## 2023-11-30 ENCOUNTER — Other Ambulatory Visit: Payer: Self-pay | Admitting: Family Medicine

## 2023-11-30 DIAGNOSIS — E119 Type 2 diabetes mellitus without complications: Secondary | ICD-10-CM | POA: Diagnosis not present

## 2023-11-30 DIAGNOSIS — I110 Hypertensive heart disease with heart failure: Secondary | ICD-10-CM | POA: Diagnosis not present

## 2023-11-30 DIAGNOSIS — I5042 Chronic combined systolic (congestive) and diastolic (congestive) heart failure: Secondary | ICD-10-CM | POA: Diagnosis not present

## 2023-11-30 DIAGNOSIS — J4489 Other specified chronic obstructive pulmonary disease: Secondary | ICD-10-CM | POA: Diagnosis not present

## 2023-11-30 DIAGNOSIS — J9611 Chronic respiratory failure with hypoxia: Secondary | ICD-10-CM | POA: Diagnosis not present

## 2023-11-30 DIAGNOSIS — K219 Gastro-esophageal reflux disease without esophagitis: Secondary | ICD-10-CM | POA: Diagnosis not present

## 2023-11-30 DIAGNOSIS — I480 Paroxysmal atrial fibrillation: Secondary | ICD-10-CM | POA: Diagnosis not present

## 2023-11-30 DIAGNOSIS — I959 Hypotension, unspecified: Secondary | ICD-10-CM | POA: Diagnosis not present

## 2023-11-30 DIAGNOSIS — G4700 Insomnia, unspecified: Secondary | ICD-10-CM

## 2023-11-30 DIAGNOSIS — I2510 Atherosclerotic heart disease of native coronary artery without angina pectoris: Secondary | ICD-10-CM | POA: Diagnosis not present

## 2023-11-30 NOTE — Telephone Encounter (Signed)
 Medication Refill -  Most Recent Primary Care Visit:  Provider: CLIFTON, KELLIE A  Department: [facility]  Visit Type: OFFICE VISIT  Date: 10/13/2023  Medication: traZODone  (DESYREL ) 100 MG tablet [[id number]]   Has the patient contacted their pharmacy? No (Agent: If no, request that the patient contact the pharmacy for the refill. If patient does not wish to contact the pharmacy document the reason why and proceed with request.) Pt was told to contact Dr. Gasper for refill   Is this the correct pharmacy for this prescription? Yes  This is the patient's preferred pharmacy:  [facility] - [place], [place] - [address] [place] [address] Phone: [phone] Fax: [phone]    Has the prescription been filled recently? Yes  Is the patient out of the medication? Yes  Has the patient been seen for an appointment in the last year OR does the patient have an upcoming appointment? Yes  Can we respond through MyChart? No  Agent: Please be advised that Rx refills may take up to 3 business days. We ask that you follow-up with your pharmacy.

## 2023-12-01 ENCOUNTER — Telehealth: Payer: Self-pay | Admitting: Family Medicine

## 2023-12-01 DIAGNOSIS — G4700 Insomnia, unspecified: Secondary | ICD-10-CM

## 2023-12-01 MED ORDER — ACCU-CHEK GUIDE CONTROL VI LIQD: 3 refills | Status: DC

## 2023-12-01 MED ORDER — ACCU-CHEK GUIDE W/DEVICE KIT: PACK | 0 refills | Status: AC

## 2023-12-01 MED ORDER — TRAZODONE HCL 100 MG PO TABS: 100.0000 mg | ORAL_TABLET | Freq: Every evening | ORAL | 3 refills | Status: DC | PRN

## 2023-12-01 NOTE — Addendum Note (Signed)
 Addended by: Lamon Pillow on: 12/01/2023 01:27 PM   Modules accepted: Orders

## 2023-12-01 NOTE — Telephone Encounter (Signed)
 Glucometer prescription sent to pharmacy. CPAP order printed, signed  and sent to medical records to be faxed to April

## 2023-12-01 NOTE — Telephone Encounter (Signed)
 Lawrence [name] who is  with   [facility] - [place], [place] - [address] [place], [place] [address] Phone: [phone]  Fax: [phone]  Declined to speak with a nurse, she stated she just wants a response of approval or denial of the medication   traZODone (DESYREL) 100 MG tablet [[id number]]      as soon as possible   Lawrence [name] states the patient has called several times regarding this medication.

## 2023-12-01 NOTE — Telephone Encounter (Signed)
 Requested medication (s) are due for refill today: Yes  Requested medication (s) are on the active medication list: Yes  Last refill:  09/30/23  Future visit scheduled:   Notes to clinic:  Last filled by Dr. Lydia Sams.    Requested Prescriptions  Pending Prescriptions Disp Refills   traZODone  (DESYREL ) 100 MG tablet 10 tablet 0    Sig: Take 1 tablet (100 mg total) by mouth at bedtime.     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

## 2023-12-01 NOTE — Telephone Encounter (Signed)
 Dawn from [employer], is asking can refill of trazadone be for 30 pills instead of 10 and he also needs a blood sugar meter

## 2023-12-02 DIAGNOSIS — I5042 Chronic combined systolic (congestive) and diastolic (congestive) heart failure: Secondary | ICD-10-CM | POA: Diagnosis not present

## 2023-12-02 DIAGNOSIS — I480 Paroxysmal atrial fibrillation: Secondary | ICD-10-CM | POA: Diagnosis not present

## 2023-12-02 DIAGNOSIS — I2510 Atherosclerotic heart disease of native coronary artery without angina pectoris: Secondary | ICD-10-CM | POA: Diagnosis not present

## 2023-12-02 DIAGNOSIS — J9611 Chronic respiratory failure with hypoxia: Secondary | ICD-10-CM | POA: Diagnosis not present

## 2023-12-02 DIAGNOSIS — J4489 Other specified chronic obstructive pulmonary disease: Secondary | ICD-10-CM | POA: Diagnosis not present

## 2023-12-02 DIAGNOSIS — E119 Type 2 diabetes mellitus without complications: Secondary | ICD-10-CM | POA: Diagnosis not present

## 2023-12-02 DIAGNOSIS — K219 Gastro-esophageal reflux disease without esophagitis: Secondary | ICD-10-CM | POA: Diagnosis not present

## 2023-12-02 DIAGNOSIS — I110 Hypertensive heart disease with heart failure: Secondary | ICD-10-CM | POA: Diagnosis not present

## 2023-12-02 DIAGNOSIS — I959 Hypotension, unspecified: Secondary | ICD-10-CM | POA: Diagnosis not present

## 2023-12-02 MED ORDER — TRAZODONE HCL 100 MG PO TABS: 100.0000 mg | ORAL_TABLET | Freq: Every evening | ORAL | 1 refills | Status: DC | PRN

## 2023-12-04 ENCOUNTER — Emergency Department
Admission: EM | Admit: 2023-12-04 | Discharge: 2023-12-05 | Disposition: A | Discharge status: Home / Self Care | Payer: Medicare HMO | Attending: Emergency Medicine | Admitting: Emergency Medicine

## 2023-12-04 ENCOUNTER — Emergency Department: Payer: Medicare HMO

## 2023-12-04 ENCOUNTER — Other Ambulatory Visit: Payer: Self-pay

## 2023-12-04 DIAGNOSIS — Z7982 Long term (current) use of aspirin: Secondary | ICD-10-CM | POA: Insufficient documentation

## 2023-12-04 DIAGNOSIS — J449 Chronic obstructive pulmonary disease, unspecified: Secondary | ICD-10-CM | POA: Insufficient documentation

## 2023-12-04 DIAGNOSIS — M79603 Pain in arm, unspecified: Secondary | ICD-10-CM | POA: Diagnosis not present

## 2023-12-04 DIAGNOSIS — I4891 Unspecified atrial fibrillation: Secondary | ICD-10-CM | POA: Diagnosis not present

## 2023-12-04 DIAGNOSIS — E119 Type 2 diabetes mellitus without complications: Secondary | ICD-10-CM | POA: Insufficient documentation

## 2023-12-04 DIAGNOSIS — R079 Chest pain, unspecified: Secondary | ICD-10-CM | POA: Diagnosis not present

## 2023-12-04 DIAGNOSIS — I2510 Atherosclerotic heart disease of native coronary artery without angina pectoris: Secondary | ICD-10-CM | POA: Insufficient documentation

## 2023-12-04 DIAGNOSIS — I509 Heart failure, unspecified: Secondary | ICD-10-CM | POA: Insufficient documentation

## 2023-12-04 DIAGNOSIS — R0789 Other chest pain: Secondary | ICD-10-CM | POA: Diagnosis not present

## 2023-12-04 DIAGNOSIS — I10 Essential (primary) hypertension: Secondary | ICD-10-CM | POA: Diagnosis not present

## 2023-12-04 DIAGNOSIS — Z7901 Long term (current) use of anticoagulants: Secondary | ICD-10-CM | POA: Insufficient documentation

## 2023-12-04 DIAGNOSIS — I110 Hypertensive heart disease with heart failure: Secondary | ICD-10-CM | POA: Diagnosis not present

## 2023-12-04 LAB — TROPONIN I (HIGH SENSITIVITY)
Troponin I (High Sensitivity): 12 ng/L (ref <18)
Troponin I (High Sensitivity): 11 ng/L (ref <18)

## 2023-12-04 LAB — CBC
HCT: 53.5 % — ABNORMAL HIGH (ref 39.0–52.0)
Hemoglobin: 17.2 g/dL — ABNORMAL HIGH (ref 13.0–17.0)
MCH: 29.3 pg (ref 26.0–34.0)
MCHC: 32.1 g/dL (ref 30.0–36.0)
MCV: 91 fL (ref 80.0–100.0)
Platelets: 200 10*3/uL (ref 150–400)
RBC: 5.88 MIL/uL — ABNORMAL HIGH (ref 4.22–5.81)
RDW: 14.8 % (ref 11.5–15.5)
WBC: 9.1 10*3/uL (ref 4.0–10.5)
nRBC: 0 % (ref 0.0–0.2)

## 2023-12-04 LAB — BASIC METABOLIC PANEL
Anion gap: 10 (ref 5–15)
BUN: 9 mg/dL (ref 8–23)
CO2: 29 mmol/L (ref 22–32)
Calcium: 9.4 mg/dL (ref 8.9–10.3)
Chloride: 103 mmol/L (ref 98–111)
Creatinine, Ser: 0.82 mg/dL (ref 0.61–1.24)
GFR, Estimated: >60 mL/min (ref >60)
Glucose, Bld: 124 mg/dL — ABNORMAL HIGH (ref 70–99)
Potassium: 4.2 mmol/L (ref 3.5–5.1)
Sodium: 142 mmol/L (ref 135–145)

## 2023-12-04 MED ORDER — OXYCODONE-ACETAMINOPHEN 5-325 MG PO TABS
2.0000 | ORAL_TABLET | Freq: Once | ORAL | Status: AC
  Administered 2023-12-04: 2 via ORAL
  Filled 2023-12-04: qty 2

## 2023-12-04 NOTE — ED Provider Notes (Signed)
[facility] Provider Note    Event Date/Time   First MD Initiated Contact with Patient 12/04/23 2302     (approximate)   History   Chief Complaint Chest Pain   HPI  Lawrence [name] is a 68 y.o. male with past medical history of hypertension, diabetes, CAD, atrial fibrillation on Eliquis, COPD, CHF, and chronic chest pain who presents to the ED complaining of chest pain.  Patient reports that he had sudden onset of sharp pain in the center of his chest around 5 PM this evening while at rest.  He states that the pain radiates down his left arm, has been associated with some mild difficulty breathing.  He endorses a recent cough, has not had any fevers.  He has not had any pain or swelling in his legs.  He received aspirin and nitroglycerin with EMS without significant relief.     Physical Exam   Triage Vital Signs: ED Triage Vitals  Encounter Vitals Group     BP 12/04/23 1846 (!) 140/83     Systolic BP Percentile --      Diastolic BP Percentile --      Pulse Rate 12/04/23 1846 87     Resp 12/04/23 1846 16     Temp 12/04/23 1846 97.7 F (36.5 C)     Temp Source 12/04/23 1846 Oral     SpO2 12/04/23 1846 95 %     Weight 12/04/23 1847 185 lb (83.9 kg)     Height 12/04/23 1847 5\' 7"  (1.702 m)     Head Circumference --      Peak Flow --      Pain Score 12/04/23 1847 10     Pain Loc --      Pain Education --      Exclude from Growth Chart --     Most recent vital signs: Vitals:   12/04/23 2205 12/04/23 2356  BP: (!) 149/94 (!) 140/93  Pulse:  68  Resp: 14 18  Temp: 98 F (36.7 C) 98.1 F (36.7 C)  SpO2:  97%    Constitutional: Alert and oriented. Eyes: Conjunctivae are normal. Head: Atraumatic. Nose: No congestion/rhinnorhea. Mouth/Throat: Mucous membranes are moist.  Cardiovascular: Normal rate, irregularly irregular rhythm. Grossly normal heart sounds.  2+ radial pulses bilaterally. Respiratory: Normal respiratory effort.  No  retractions. Lungs CTAB.  No chest wall tenderness to palpation. Gastrointestinal: Soft and nontender. No distention. Musculoskeletal: No lower extremity tenderness nor edema.  Neurologic:  Normal speech and language. No gross focal neurologic deficits are appreciated.    ED Results / Procedures / Treatments   Labs (all labs ordered are listed, but only abnormal results are displayed) Labs Reviewed  BASIC METABOLIC PANEL - Abnormal; Notable for the following components:      Result Value   Glucose, Bld 124 (*)    All other components within normal limits  CBC - Abnormal; Notable for the following components:   RBC 5.88 (*)    Hemoglobin 17.2 (*)    HCT 53.5 (*)    All other components within normal limits  TROPONIN I (HIGH SENSITIVITY)  TROPONIN I (HIGH SENSITIVITY)     EKG  ED ECG REPORT I, [name], the attending physician, personally viewed and interpreted this ECG.   Date: 12/04/2023  EKG Time: 18:44  Rate: 78  Rhythm: atrial fibrillation  Axis: Normal  Intervals:none  ST&T Change: None  RADIOLOGY Chest x-ray reviewed and interpreted by me with no infiltrate,  edema, or effusion.  PROCEDURES:  Critical Care performed: No  Procedures   MEDICATIONS ORDERED IN ED: Medications  oxyCODONE-acetaminophen (PERCOCET/ROXICET) 5-325 MG per tablet 2 tablet (2 tablets Oral Given 12/04/23 2355)     IMPRESSION / MDM / ASSESSMENT AND PLAN / ED COURSE  I reviewed the triage vital signs and the nursing notes.                              68 y.o. male with past medical history of hypertension, diabetes, CAD, atrial fibrillation on Eliquis, CHF, COPD, and chronic chest pain who presents to the ED with acute onset sharp pain in his chest radiating down left arm earlier this evening.  Patient's presentation is most consistent with acute presentation with potential threat to life or bodily function.  Differential diagnosis includes, but is not limited to, ACS, PE,  pneumonia, pneumothorax, musculoskeletal pain, GERD, anxiety.  Patient nontoxic-appearing and in no acute distress, vital signs are unremarkable.  He is not in any respiratory distress and maintaining oxygen saturations at 95%, no wheezing noted.  EKG shows atrial fibrillation with no ischemic changes and 2 sets of troponin are within normal limits.  Patient has long history of chronic chest pain, was admitted to [facility] at the end of December for the same, at which time stress testing showed no reversible defect.  Low suspicion for ACS at this time and doubt PE or dissection.  Chest x-ray is unremarkable and remainder of labs are reassuring with no significant anemia, leukocytosis, electrolyte abnormality, or AKI.  Patient is appropriate for discharge home with outpatient cardiology follow-up, was counseled to return to the ED for new or worsening symptoms.  Patient agrees with plan.      FINAL CLINICAL IMPRESSION(S) / ED DIAGNOSES   Final diagnoses:  Nonspecific chest pain     Rx / DC Orders   ED Discharge Orders     None        Note:  This document was prepared using Dragon voice recognition software and may include unintentional dictation errors.   [name], MD 12/04/23 [phone]

## 2023-12-04 NOTE — ED Triage Notes (Signed)
Pt to ER via EMS for chest pain left arm pain hurts more when moving.  CP started 5pm pt took  324mg  asa at home 3 Nitro 5 minutes apart no relief.  137/85, 95%, 94 Oxy for back pain at home, reports he has taken all hos medications

## 2023-12-06 DIAGNOSIS — R0989 Other specified symptoms and signs involving the circulatory and respiratory systems: Secondary | ICD-10-CM | POA: Diagnosis not present

## 2023-12-06 DIAGNOSIS — I5030 Unspecified diastolic (congestive) heart failure: Secondary | ICD-10-CM | POA: Diagnosis not present

## 2023-12-06 DIAGNOSIS — J189 Pneumonia, unspecified organism: Secondary | ICD-10-CM | POA: Diagnosis not present

## 2023-12-06 DIAGNOSIS — R918 Other nonspecific abnormal finding of lung field: Secondary | ICD-10-CM | POA: Diagnosis not present

## 2023-12-06 DIAGNOSIS — I110 Hypertensive heart disease with heart failure: Secondary | ICD-10-CM | POA: Diagnosis not present

## 2023-12-06 DIAGNOSIS — I25810 Atherosclerosis of coronary artery bypass graft(s) without angina pectoris: Secondary | ICD-10-CM | POA: Diagnosis not present

## 2023-12-06 DIAGNOSIS — Z66 Do not resuscitate: Secondary | ICD-10-CM | POA: Diagnosis not present

## 2023-12-06 DIAGNOSIS — I480 Paroxysmal atrial fibrillation: Secondary | ICD-10-CM | POA: Diagnosis not present

## 2023-12-06 DIAGNOSIS — R079 Chest pain, unspecified: Secondary | ICD-10-CM | POA: Diagnosis not present

## 2023-12-06 DIAGNOSIS — E119 Type 2 diabetes mellitus without complications: Secondary | ICD-10-CM | POA: Diagnosis not present

## 2023-12-06 DIAGNOSIS — I5032 Chronic diastolic (congestive) heart failure: Secondary | ICD-10-CM | POA: Diagnosis not present

## 2023-12-06 DIAGNOSIS — D369 Benign neoplasm, unspecified site: Secondary | ICD-10-CM | POA: Diagnosis not present

## 2023-12-06 DIAGNOSIS — E8729 Other acidosis: Secondary | ICD-10-CM | POA: Diagnosis not present

## 2023-12-06 DIAGNOSIS — R0789 Other chest pain: Secondary | ICD-10-CM | POA: Diagnosis not present

## 2023-12-06 DIAGNOSIS — J449 Chronic obstructive pulmonary disease, unspecified: Secondary | ICD-10-CM | POA: Diagnosis not present

## 2023-12-07 ENCOUNTER — Other Ambulatory Visit: Payer: Self-pay | Admitting: Family Medicine

## 2023-12-07 ENCOUNTER — Ambulatory Visit: Payer: Medicare HMO | Admitting: Family Medicine

## 2023-12-07 DIAGNOSIS — I25810 Atherosclerosis of coronary artery bypass graft(s) without angina pectoris: Secondary | ICD-10-CM | POA: Diagnosis not present

## 2023-12-07 DIAGNOSIS — I110 Hypertensive heart disease with heart failure: Secondary | ICD-10-CM | POA: Diagnosis not present

## 2023-12-07 DIAGNOSIS — R079 Chest pain, unspecified: Secondary | ICD-10-CM | POA: Diagnosis not present

## 2023-12-07 DIAGNOSIS — I5030 Unspecified diastolic (congestive) heart failure: Secondary | ICD-10-CM | POA: Diagnosis not present

## 2023-12-07 NOTE — Telephone Encounter (Signed)
Requested medications are due for refill today.  yes  Requested medications are on the active medications list.  yes  Last refill. 11/05/2023 #180 0 rf  Future visit scheduled.   yes  Notes to clinic.  Refill/refusal not delegated.    Requested Prescriptions  Pending Prescriptions Disp Refills   oxyCODONE-acetaminophen (PERCOCET) 10-325 MG tablet [Pharmacy Med Name: OXYCODONE-APAP 10-325 MG TAB] 180 tablet     Sig: TAKE 1 TABLET BY MOUTH EVERY 4 HOURS AS NEEDED FOR PAIN.     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 Malva Limes, MD   7 months ago No-show for appointment   [facility] Jacky Kindle, FNP       Future Appointments             In 3 days [name], Demetrios Isaacs, MD [facility], [name]

## 2023-12-08 ENCOUNTER — Telehealth: Payer: Self-pay

## 2023-12-08 DIAGNOSIS — R0789 Other chest pain: Secondary | ICD-10-CM | POA: Diagnosis not present

## 2023-12-08 NOTE — Transitions of Care (Post Inpatient/ED Visit) (Signed)
12/08/2023  Name: Lawrence [name] MRN: [id number] DOB: [birth year]  Today's TOC FU Call Status: Today's TOC FU Call Status:: Successful TOC FU Call Completed TOC FU Call Complete Date: 12/08/23 Patient's Name and Date of Birth confirmed.  Transition Care Management Follow-up Telephone Call Date of Discharge: 12/07/23 Discharge Facility: Other [facility]) Name of Other ([facility]) Discharge Facility: [facility] Type of Discharge: Emergency Department Primary Inpatient Discharge Diagnosis:: throat and chest pain How have you been since you were released from the hospital?: Better Any questions or concerns?: No  Items Reviewed: Did you receive and understand the discharge instructions provided?: Yes Medications obtained,verified, and reconciled?: Yes (Medications Reviewed) Any new allergies since your discharge?: No Dietary orders reviewed?: Yes Do you have support at home?: Yes People in Home: grandchild(ren)  Medications Reviewed Today: Medications Reviewed Today     Reviewed by Karena Addison, LPN (Licensed Practical Nurse) on 12/08/23 at 1008  Med List Status: <None>   Medication Order Taking? Sig Documenting Provider Last Dose Status Informant  Accu-Chek Softclix Lancets lancets [id number] No Use as instructed to check sugar daily for type 2 diabetes. Malva Limes, MD Taking Active Pharmacy Records, Other  allopurinol (ZYLOPRIM) 300 MG tablet [id number] No Take 300 mg by mouth 2 (two) times daily. [provider] Taking Active   ALPRAZolam Prudy Feeler) 1 MG tablet [id number] No Take 1 tablet (1 mg total) by mouth 3 (three) times daily as needed. Malva Limes, MD Taking Active Pharmacy Records, Other  amLODipine (NORVASC) 10 MG tablet [id number] No Take 10 mg by mouth daily. [provider] Taking Active Self  apixaban (ELIQUIS) 5 MG TABS tablet [id number] No Take 1 tablet (5 mg total) by mouth 2 (two) times daily. Malva Limes, MD Taking Active  Pharmacy Records, Other  atorvastatin (LIPITOR) 80 MG tablet [id number] No TAKE 1 TABLET BY MOUTH AT BEDTIME Malva Limes, MD Taking Active Pharmacy Records, Other  Blood Glucose Calibration (ACCU-CHEK GUIDE CONTROL) LIQD [id number]  Use with blood glucose monitor as directed Malva Limes, MD  Active   Blood Glucose Monitoring Suppl (ACCU-CHEK GUIDE) w/Device KIT [id number]  Use to check blood sugars as directed Malva Limes, MD  Active   Budeson-Glycopyrrol-Formoterol (BREZTRI AEROSPHERE) 160-9-4.8 MCG/ACT AERO [id number] No Inhale 2 puffs into the lungs 2 (two) times daily. Malva Limes, MD Taking Active Pharmacy Records, Other  carvedilol (COREG) 3.125 MG tablet [id number] No Take 3.125 mg by mouth 2 (two) times daily with a meal. Lovenia Shuck, MD Taking Active Pharmacy Records, Other  cetirizine (ZYRTEC) 10 MG tablet [id number] No TAKE 1 TABLET BY MOUTH AT BEDTIME Malva Limes, MD Taking Active Pharmacy Records, Other           Med Note Leandrew Koyanagi Oct 18, 2023 10:05 PM) Last filled 4D supply in June  Cyanocobalamin (VITAMIN B-12) 1000 MCG SUBL [id number] No Place 1 tablet under the tongue daily. [provider] Unknown Unknown Active Pharmacy Records, Other  FARXIGA 10 MG TABS tablet [id number] No Take 10 mg by mouth daily. [provider] Taking Active Pharmacy Records, Other           Med Note Leandrew Koyanagi Oct 18, 2023 10:09 PM) Last filled 08/16/23 for 30 days.   glucose blood (ACCU-CHEK GUIDE) test strip [id number] No Use as instructed to check sugar daily for type 2 diabetes. Malva Limes, MD Taking Active Pharmacy Records, Other  iron polysaccharides (  FERREX 150) 150 MG capsule [id number] No TAKE 1 CAPSULE BY MOUTH DAILY [name], Demetrios Isaacs, MD Unknown Unknown Active Pharmacy Records, Other  isosorbide mononitrate (IMDUR) 60 MG 24 hr tablet [id number] No Take 1 tablet (60 mg total) by mouth daily. Lurene Shadow, MD Taking Active   lidocaine  (LIDODERM) 5 % [id number] No Place 1 patch onto the skin daily. Remove & Discard patch within 12 hours or as directed by MD Arnetha Courser, MD Unknown Unknown Active Pharmacy Records, Other  metFORMIN (GLUCOPHAGE-XR) 500 MG 24 hr tablet [id number] No Take 1 tablet (500 mg total) by mouth 2 (two) times daily.  Patient taking differently: Take 500 mg by mouth daily.   Jacky Kindle, FNP Taking Active Pharmacy Records, Other           Med Note [employer], Robyn Haber   Fri Oct 01, 2023  4:16 PM) Reports taking one a day  methocarbamol (ROBAXIN) 500 MG tablet [id number] No TAKE 1 TABLET BY MOUTH EVERY 8 HOURS AS NEEDED FOR MUSCLE SPASMS Malva Limes, MD Taking Active Pharmacy Records, Other           Med Note Lorenso Courier, Marjory Lies   Thu Aug 26, 2023  1:48 PM) Up to 4x a day  nitroGLYCERIN (NITROSTAT) 0.4 MG SL tablet [id number] No Place 1 tablet (0.4 mg total) under the tongue every 5 (five) minutes as needed for chest pain (Do not take if blood pressure is low, systolic BP<110). Lurene Shadow, MD Taking Active   omega-3 acid ethyl esters (LOVAZA) 1 g capsule [id number] No TAKE 4 CAPSULES (4 GRAMS TOTAL) BY Miachel Roux, MD Taking Active Pharmacy Records, Other  oxyCODONE-acetaminophen (PERCOCET) 10-325 MG tablet [id number]  TAKE 1 TABLET BY MOUTH EVERY 4 HOURS AS NEEDED FOR PAIN. Malva Limes, MD  Active   OXYGEN [id number] No Inhale 2 L into the lungs at bedtime as needed (for shortness of breath). [provider] Taking Active Pharmacy Records, Other  pantoprazole (PROTONIX) 40 MG tablet [id number] No Take 1 tablet (40 mg total) by mouth 2 (two) times daily for 14 days. Celso Amy, PA-C Past Week Expired 08/07/23 2359 Self, Pharmacy Records           Med Note Leandrew Koyanagi Oct 18, 2023 10:01 PM) Last filled in July for 14 days  ranolazine (RANEXA) 1000 MG SR tablet [id number] No Take 1 tablet (1,000 mg total) by mouth 2 (two) times daily. Marcelino Duster, MD Taking Active  Pharmacy Records, Other  sacubitril-valsartan [facility]) 24-26 [place] [id number] No Take 1 tablet by mouth 2 (two) times daily. [provider] Taking Active Self  sennosides-docusate sodium (SENOKOT-S) 8.6-50 MG tablet [id number] No Take 1 tablet by mouth daily. [provider] Taking Active Self  spironolactone (ALDACTONE) 25 MG tablet [id number] No Take 25 mg by mouth daily. [provider] Taking Active Self  sucralfate (CARAFATE) 1 GM/10ML suspension [id number]  Take 10 mLs (1 g total) by mouth 4 (four) times daily. Pilar Jarvis, MD  Active   traZODone (DESYREL) 100 MG tablet [id number]  Take 1 tablet (100 mg total) by mouth at bedtime as needed for sleep. Malva Limes, MD  Active   venlafaxine XR (EFFEXOR-XR) 75 MG 24 hr capsule [id number] No Take 75 mg by mouth daily with breakfast. [provider] Taking Active Pharmacy Records, Other            Home Care and Equipment/Supplies: Were Home Health Services Ordered?: NA  Any new equipment or medical supplies ordered?: NA  Functional Questionnaire: Do you need assistance with bathing/showering or dressing?: Yes Do you need assistance with meal preparation?: Yes Do you need assistance with eating?: No Do you have difficulty maintaining continence: No Do you need assistance with getting out of bed/getting out of a chair/moving?: No Do you have difficulty managing or taking your medications?: No  Follow up appointments reviewed: PCP Follow-up appointment confirmed?: Yes Date of PCP follow-up appointment?: 12/10/23 Follow-up Provider: [facility] Follow-up appointment confirmed?: No Reason Specialist Follow-Up Not Confirmed: Patient has Specialist Provider Number and will Call for Appointment Do you need transportation to your follow-up appointment?: No Do you understand care options if your condition(s) worsen?: Yes-patient verbalized understanding    SIGNATURE Karena Addison,  LPN [facility] Nurse Health Advisor Direct Dial [phone]

## 2023-12-09 ENCOUNTER — Telehealth: Payer: Self-pay | Admitting: Family Medicine

## 2023-12-09 ENCOUNTER — Other Ambulatory Visit: Payer: Medicare HMO

## 2023-12-09 ENCOUNTER — Ambulatory Visit: Payer: Self-pay | Admitting: *Deleted

## 2023-12-09 DIAGNOSIS — E119 Type 2 diabetes mellitus without complications: Secondary | ICD-10-CM | POA: Diagnosis not present

## 2023-12-09 DIAGNOSIS — J9611 Chronic respiratory failure with hypoxia: Secondary | ICD-10-CM | POA: Diagnosis not present

## 2023-12-09 DIAGNOSIS — K219 Gastro-esophageal reflux disease without esophagitis: Secondary | ICD-10-CM | POA: Diagnosis not present

## 2023-12-09 DIAGNOSIS — J4489 Other specified chronic obstructive pulmonary disease: Secondary | ICD-10-CM | POA: Diagnosis not present

## 2023-12-09 DIAGNOSIS — I480 Paroxysmal atrial fibrillation: Secondary | ICD-10-CM | POA: Diagnosis not present

## 2023-12-09 DIAGNOSIS — I959 Hypotension, unspecified: Secondary | ICD-10-CM | POA: Diagnosis not present

## 2023-12-09 DIAGNOSIS — I2510 Atherosclerotic heart disease of native coronary artery without angina pectoris: Secondary | ICD-10-CM | POA: Diagnosis not present

## 2023-12-09 DIAGNOSIS — I5042 Chronic combined systolic (congestive) and diastolic (congestive) heart failure: Secondary | ICD-10-CM | POA: Diagnosis not present

## 2023-12-09 DIAGNOSIS — I110 Hypertensive heart disease with heart failure: Secondary | ICD-10-CM | POA: Diagnosis not present

## 2023-12-09 NOTE — Telephone Encounter (Signed)
[name] with [employer] called on the 10th asking for a sleep study .  She said she visited him today and his afib is uncontrolled.  Not sleeping well.  She is wanting someone to check on the patient.  He lost his cpap and needs to have a new one.    CB#  [phone]

## 2023-12-10 ENCOUNTER — Ambulatory Visit (INDEPENDENT_AMBULATORY_CARE_PROVIDER_SITE_OTHER): Payer: Medicare HMO | Admitting: Family Medicine

## 2023-12-10 VITALS — BP 116/76 | HR 79 | Temp 94.1°F | Resp 16 | Ht 67.0 in | Wt 188.5 lb

## 2023-12-10 DIAGNOSIS — I5042 Chronic combined systolic (congestive) and diastolic (congestive) heart failure: Secondary | ICD-10-CM

## 2023-12-10 DIAGNOSIS — J189 Pneumonia, unspecified organism: Secondary | ICD-10-CM | POA: Diagnosis not present

## 2023-12-10 DIAGNOSIS — J9611 Chronic respiratory failure with hypoxia: Secondary | ICD-10-CM

## 2023-12-10 DIAGNOSIS — G4710 Hypersomnia, unspecified: Secondary | ICD-10-CM

## 2023-12-10 DIAGNOSIS — J449 Chronic obstructive pulmonary disease, unspecified: Secondary | ICD-10-CM

## 2023-12-10 DIAGNOSIS — G4733 Obstructive sleep apnea (adult) (pediatric): Secondary | ICD-10-CM

## 2023-12-10 NOTE — Patient Instructions (Addendum)
Visit Information  Thank you for taking time to visit with me today. Please don't hesitate to contact me if I can be of assistance to you.   Following are the goals we discussed today:   Goals Addressed             This Visit's Progress    Care Coordination Activities       Activities and task to complete in order to accomplish goals.   Keep all upcoming appointment discussed today-please ensure that you are coordinating with medicaid for transportation to all follow up appointments Self Support options  (referral completed for  mental health support through [facility], they will call patient's grandson to schedule initial appointment)             Our next appointment is by telephone on 12/24/23 at 9am  Please call the care guide team at [phone] if you need to cancel or reschedule your appointment.   If you are experiencing a Mental Health or Behavioral Health Crisis or need someone to talk to, please call 911   Patient verbalizes understanding of instructions and care plan provided today and agrees to view in MyChart. Active MyChart status and patient understanding of how to access instructions and care plan via MyChart confirmed with patient.     Telephone follow up appointment with care management team member scheduled for:12/24/23  Deshawnda Acrey, LCSW [place]  [facility] Licensed Clinical Social Worker Care Coordinator  Direct Dial: [phone]

## 2023-12-10 NOTE — Telephone Encounter (Signed)
Ordered up to date sleep test today from [facility].

## 2023-12-10 NOTE — Patient Instructions (Signed)
Marland Kitchen  Please review the attached list of medications and notify my office if there are any errors.   . Please bring all of your medications to every appointment so we can make sure that our medication list is the same as yours.

## 2023-12-10 NOTE — Patient Outreach (Signed)
  Care Coordination   Follow Up Visit Note   12/10/2023 Name: Lawrence [name] MRN: [id number] DOB: [birth year]  Lawrence [name] is a 68 y.o. year old male who sees [name], Demetrios Isaacs, MD for primary care. I spoke with  Lawrence [name] grandson by phone on 12/09/23.  What matters to the patients health and wellness today?  Mental health follow up and support    Goals Addressed             This Visit's Progress    Care Coordination Activities       Activities and task to complete in order to accomplish goals.   Keep all upcoming appointment discussed today-please ensure that you are coordinating with medicaid for transportation to all follow up appointments Self Support options  (referral completed for  mental health support through [facility], they will call patient's grandson to schedule initial appointment)             SDOH assessments and interventions completed:  No     Care Coordination Interventions:  Yes, provided  Interventions Today    Flowsheet Row Most Recent Value  Chronic Disease   Chronic disease during today's visit Chronic Obstructive Pulmonary Disease (COPD), Congestive Heart Failure (CHF), Atrial Fibrillation (AFib)  General Interventions   General Interventions Discussed/Reviewed General Interventions Reviewed, Communication with  [continued assessment of community resource needs-patient's grandson confirmed recent discharge from hospital due to throat and chest pain. Per grandson, [facility] nurse visited today]  Communication with --  [[facility]-referral completed for mental health follow up]  Mental Health Interventions   Mental Health Discussed/Reviewed Mental Health Reviewed, Anxiety  [referral completed with [facility] for ongooing mental health follow up, they will contact patient to schedule initial appointment for anxiety symptom management]       Follow up plan: Follow up call scheduled for 01/13/24    Encounter Outcome:  Patient Visit  Completed

## 2023-12-11 ENCOUNTER — Emergency Department
Admission: EM | Admit: 2023-12-11 | Discharge: 2023-12-11 | Disposition: A | Discharge status: Home / Self Care | Payer: Medicare HMO | Attending: Emergency Medicine | Admitting: Emergency Medicine

## 2023-12-11 ENCOUNTER — Other Ambulatory Visit: Payer: Self-pay

## 2023-12-11 ENCOUNTER — Encounter: Payer: Self-pay | Admitting: Intensive Care

## 2023-12-11 ENCOUNTER — Emergency Department: Payer: Medicare HMO

## 2023-12-11 DIAGNOSIS — R079 Chest pain, unspecified: Secondary | ICD-10-CM | POA: Diagnosis not present

## 2023-12-11 DIAGNOSIS — I10 Essential (primary) hypertension: Secondary | ICD-10-CM | POA: Diagnosis not present

## 2023-12-11 DIAGNOSIS — I2510 Atherosclerotic heart disease of native coronary artery without angina pectoris: Secondary | ICD-10-CM | POA: Insufficient documentation

## 2023-12-11 DIAGNOSIS — J449 Chronic obstructive pulmonary disease, unspecified: Secondary | ICD-10-CM | POA: Diagnosis not present

## 2023-12-11 DIAGNOSIS — E119 Type 2 diabetes mellitus without complications: Secondary | ICD-10-CM | POA: Diagnosis not present

## 2023-12-11 DIAGNOSIS — R0789 Other chest pain: Secondary | ICD-10-CM

## 2023-12-11 DIAGNOSIS — Z7901 Long term (current) use of anticoagulants: Secondary | ICD-10-CM | POA: Diagnosis not present

## 2023-12-11 DIAGNOSIS — I4891 Unspecified atrial fibrillation: Secondary | ICD-10-CM | POA: Diagnosis not present

## 2023-12-11 DIAGNOSIS — Z951 Presence of aortocoronary bypass graft: Secondary | ICD-10-CM | POA: Insufficient documentation

## 2023-12-11 DIAGNOSIS — R0602 Shortness of breath: Secondary | ICD-10-CM | POA: Diagnosis not present

## 2023-12-11 LAB — TROPONIN I (HIGH SENSITIVITY)
Troponin I (High Sensitivity): 20 ng/L — ABNORMAL HIGH (ref <18)
Troponin I (High Sensitivity): 22 ng/L — ABNORMAL HIGH (ref <18)

## 2023-12-11 LAB — CBC WITH DIFFERENTIAL/PLATELET
Abs Immature Granulocytes: 0.02 10*3/uL (ref 0.00–0.07)
Basophils Absolute: 0.1 10*3/uL (ref 0.0–0.1)
Basophils Relative: 1 %
Eosinophils Absolute: 0.2 10*3/uL (ref 0.0–0.5)
Eosinophils Relative: 2 %
HCT: 56.4 % — ABNORMAL HIGH (ref 39.0–52.0)
Hemoglobin: 17.6 g/dL — ABNORMAL HIGH (ref 13.0–17.0)
Immature Granulocytes: 0 %
Lymphocytes Relative: 9 %
Lymphs Abs: 0.8 10*3/uL (ref 0.7–4.0)
MCH: 28.6 pg (ref 26.0–34.0)
MCHC: 31.2 g/dL (ref 30.0–36.0)
MCV: 91.7 fL (ref 80.0–100.0)
Monocytes Absolute: 0.6 10*3/uL (ref 0.1–1.0)
Monocytes Relative: 7 %
Neutro Abs: 7.1 10*3/uL (ref 1.7–7.7)
Neutrophils Relative %: 81 %
Platelets: 148 10*3/uL — ABNORMAL LOW (ref 150–400)
RBC: 6.15 MIL/uL — ABNORMAL HIGH (ref 4.22–5.81)
RDW: 14.6 % (ref 11.5–15.5)
WBC: 8.7 10*3/uL (ref 4.0–10.5)
nRBC: 0 % (ref 0.0–0.2)

## 2023-12-11 LAB — BASIC METABOLIC PANEL
Anion gap: 10 (ref 5–15)
BUN: 14 mg/dL (ref 8–23)
CO2: 31 mmol/L (ref 22–32)
Calcium: 9.6 mg/dL (ref 8.9–10.3)
Chloride: 99 mmol/L (ref 98–111)
Creatinine, Ser: 0.9 mg/dL (ref 0.61–1.24)
GFR, Estimated: >60 mL/min (ref >60)
Glucose, Bld: 153 mg/dL — ABNORMAL HIGH (ref 70–99)
Potassium: 4.3 mmol/L (ref 3.5–5.1)
Sodium: 140 mmol/L (ref 135–145)

## 2023-12-11 LAB — URINALYSIS, ROUTINE W REFLEX MICROSCOPIC
Bacteria, UA: NONE SEEN
Bilirubin Urine: NEGATIVE
Glucose, UA: >=500 mg/dL — AB
Ketones, ur: NEGATIVE mg/dL
Leukocytes,Ua: NEGATIVE
Nitrite: NEGATIVE
Protein, ur: NEGATIVE mg/dL
Specific Gravity, Urine: 1.008 (ref 1.005–1.030)
Squamous Epithelial / HPF: 0 /[HPF] (ref 0–5)
pH: 6 (ref 5.0–8.0)

## 2023-12-11 MED ORDER — METOPROLOL TARTRATE 5 MG/5ML IV SOLN
2.5000 mg | Freq: Once | INTRAVENOUS | Status: AC
  Administered 2023-12-11: 2.5 mg via INTRAVENOUS
  Filled 2023-12-11: qty 5

## 2023-12-11 MED ORDER — OXYCODONE-ACETAMINOPHEN 5-325 MG PO TABS
2.0000 | ORAL_TABLET | Freq: Once | ORAL | Status: AC
  Administered 2023-12-11: 2 via ORAL
  Filled 2023-12-11: qty 2

## 2023-12-11 MED ORDER — MORPHINE SULFATE (PF) 4 MG/ML IV SOLN
4.0000 mg | Freq: Once | INTRAVENOUS | Status: AC
  Administered 2023-12-11: 4 mg via INTRAVENOUS
  Filled 2023-12-11: qty 1

## 2023-12-11 MED ORDER — CARVEDILOL 6.25 MG PO TABS
3.1250 mg | ORAL_TABLET | Freq: Once | ORAL | Status: AC
  Administered 2023-12-11: 3.125 mg via ORAL
  Filled 2023-12-11: qty 1

## 2023-12-11 NOTE — ED Provider Notes (Signed)
[facility] Provider Note    Event Date/Time   First MD Initiated Contact with Patient 12/11/23 2200     (approximate)   History   Chest Pain   HPI  Lawrence [name] is a 68 y.o. male with coronary disease status post CABG, A-fib on Eliquis, COPD, diabetes with mildly elevated troponins in December who comes in with chest pain.  Patient was seen on 1/20 at [facility] emergency room for chest pain.  He had a CTA that was negative for dissection his left heart cath in 2024 showed complex coronary artery disease but no new signs of targets for intervention and then he underwent stress testing in December 2024   -- Continue home methocarbamol 500mg  four times daily -- Continue home oxycodone-acetaminophen 10-325mg  q4h PRN - Continue home Imdur 60 mg daily --Continue home Ranexa 1000 mg BID --Continue home atorvastatin 80 mg daily   Physical Exam   Triage Vital Signs: ED Triage Vitals [12/11/23 1850]  Encounter Vitals Group     BP (!) 144/102     Systolic BP Percentile      Diastolic BP Percentile      Pulse Rate (!) 114     Resp 18     Temp 98.3 F (36.8 C)     Temp Source Oral     SpO2 96 %     Weight 182 lb (82.6 kg)     Height 5\' 7"  (1.702 m)     Head Circumference      Peak Flow      Pain Score 10     Pain Loc      Pain Education      Exclude from Growth Chart     Most recent vital signs: Vitals:   12/11/23 1850 12/11/23 2121  BP: (!) 144/102 (!) 131/107  Pulse: (!) 114 (!) 115  Resp: 18 17  Temp: 98.3 F (36.8 C) 98.1 F (36.7 C)  SpO2: 96% 97%     General: Awake, no distress.  CV:  Good peripheral perfusion.  Resp:  Normal effort.  Abd:  No distention.  Soft nontender Other:  No swelling in legs.  No calf tenderness Sternal pain  ED Results / Procedures / Treatments   Labs (all labs ordered are listed, but only abnormal results are displayed) Labs Reviewed  URINALYSIS, ROUTINE W REFLEX MICROSCOPIC - Abnormal; Notable  for the following components:      Result Value   Color, Urine STRAW (*)    APPearance CLEAR (*)    Glucose, UA >=500 (*)    Hgb urine dipstick SMALL (*)    All other components within normal limits  BASIC METABOLIC PANEL - Abnormal; Notable for the following components:   Glucose, Bld 153 (*)    All other components within normal limits  CBC WITH DIFFERENTIAL/PLATELET - Abnormal; Notable for the following components:   RBC 6.15 (*)    Hemoglobin 17.6 (*)    HCT 56.4 (*)    Platelets 148 (*)    All other components within normal limits  TROPONIN I (HIGH SENSITIVITY) - Abnormal; Notable for the following components:   Troponin I (High Sensitivity) 20 (*)    All other components within normal limits  TROPONIN I (HIGH SENSITIVITY) - Abnormal; Notable for the following components:   Troponin I (High Sensitivity) 22 (*)    All other components within normal limits     EKG  My interpretation of EKG:  Atrial fibrillation with  a rate of 104 with any ST elevation or T wave inversions, normal intervals  RADIOLOGY I have reviewed the xray personally and interpreted and patient has no pneumonia  PROCEDURES:  Critical Care performed: Yes, see critical care procedure note(s)  .Critical Care  Performed by: Concha Se, MD Authorized by: Concha Se, MD   Critical care provider statement:    Critical care time (minutes):  30   Critical care was necessary to treat or prevent imminent or life-threatening deterioration of the following conditions: Afib with rvr.   Critical care was time spent personally by me on the following activities:  Development of treatment plan with patient or surrogate, discussions with consultants, evaluation of patient's response to treatment, examination of patient, ordering and review of laboratory studies, ordering and review of radiographic studies, ordering and performing treatments and interventions, pulse oximetry, re-evaluation of patient's condition  and review of old charts .1-3 Lead EKG Interpretation  Performed by: Concha Se, MD Authorized by: Concha Se, MD     Interpretation: abnormal     ECG rate:  115   ECG rate assessment: tachycardic     Rhythm: atrial fibrillation     Ectopy: none     Conduction: normal      MEDICATIONS ORDERED IN ED: Medications  morphine (PF) 4 MG/ML injection 4 mg (4 mg Intravenous Given 12/11/23 2228)  metoprolol tartrate (LOPRESSOR) injection 2.5 mg (2.5 mg Intravenous Given 12/11/23 2251)  carvedilol (COREG) tablet 3.125 mg (3.125 mg Oral Given 12/11/23 2251)  oxyCODONE-acetaminophen (PERCOCET/ROXICET) 5-325 MG per tablet 2 tablet (2 tablets Oral Given 12/11/23 2251)     IMPRESSION / MDM / ASSESSMENT AND PLAN / ED COURSE  I reviewed the triage vital signs and the nursing notes.   Patient's presentation is most consistent with acute presentation with potential threat to life or bodily function.   Patient comes in with chest pain.  Patient's been seen multiple times with similar chest pain.  Is had extensive cardiac workup both here at [facility].  He is in A-fib with mild RVR with heart rates in the low 110s.  Patient reports that he has forgotten to take his carvedilol today there are 4 I suspect this is related to medication noncompliance.  I will give him a dose of IV metoprolol and oral dose of his carvedilol and if heart rate stay normal patient can be discharged home.  He does have episodes of bradycardia so I do not want to go up on his carvedilol given this seems consistent with missing his home doses.  He denies missing his Eliquis and I do not feel like this is a PE.  He is got no evidence of CHF on examination.  I done the CT dissections for him previously when he is had this pain and they have been negative.  I given 1 dose of IV morphine and then given him his home oxycodone.  Patient understands that once heart rates are controlled that he can follow-up outpatient with cardiology.  His  troponins were slightly elevated but stable and evidence to similar to prior times it is coming to the emergency room and cardiology has not recommended admission given his chronic chest pain.   The patient is on the cardiac monitor to evaluate for evidence of arrhythmia and/or significant heart rate changes.      FINAL CLINICAL IMPRESSION(S) / ED DIAGNOSES   Final diagnoses:  Atypical chest pain  Atrial fibrillation with rapid ventricular response (HCC)  Rx / DC Orders   ED Discharge Orders     None        Note:  This document was prepared using Dragon voice recognition software and may include unintentional dictation errors.   Concha Se, MD 12/11/23 [phone]

## 2023-12-11 NOTE — ED Notes (Addendum)
First nurse note:   To ED [facility] (from home?). EMS left without giving report with written paper only. Recent PNA dx. 98% RA, a fib 113-150, 164/100, 99.8.

## 2023-12-11 NOTE — ED Triage Notes (Signed)
Patient c/o chest pain upon awakening today. Describes pain as an "elephant" on his chest that runs down his left arm. Reports nausea.   Denies SOB

## 2023-12-11 NOTE — ED Provider Triage Note (Signed)
Emergency Medicine Provider Triage Evaluation Note  Lawrence [name] , a 68 y.o. male  was evaluated in triage.  Pt complains of chest pain that he woke up with today.  Reports that it radiates down his left arm.  Reports that he feels nauseated with it but no vomiting.  Reports that he feels short of breath.  He took aspirin and nitro with no improvement.  Described as pressure  Review of Systems  Positive: Chest pain Negative: Ab pain, cough  Physical Exam  BP (!) 144/102 (BP Location: Left Arm)   Pulse (!) 114   Temp 98.3 F (36.8 C) (Oral)   Resp 18   Ht 5\' 7"  (1.702 m)   Wt 82.6 kg   SpO2 96%   BMI 28.51 kg/m  Gen:   Awake, no distress   Resp:  Normal effort  MSK:   Moves extremities without difficulty  Other:    Medical Decision Making  Medically screening exam initiated at 6:54 PM.  Appropriate orders placed.  Lawrence [name] was informed that the remainder of the evaluation will be completed by another provider, this initial triage assessment does not replace that evaluation, and the importance of remaining in the ED until their evaluation is complete.     Jackelyn Hoehn, PA-C 12/11/23 [phone]

## 2023-12-11 NOTE — Discharge Instructions (Signed)
Your workup was reassuring this is most likely related to your chronic chest pain.  Your heart rates are a little bit elevated and this could be related to missing your carvedilol.  Make sure you are taking all of your medications.  You do not need to take your carvedilol tonight given you we have given you your dose and you can resume taking it tomorrow.  Turn to the ER for worsening symptoms or any other concerns

## 2023-12-13 ENCOUNTER — Telehealth: Payer: Self-pay

## 2023-12-13 DIAGNOSIS — E119 Type 2 diabetes mellitus without complications: Secondary | ICD-10-CM | POA: Diagnosis not present

## 2023-12-13 DIAGNOSIS — I2510 Atherosclerotic heart disease of native coronary artery without angina pectoris: Secondary | ICD-10-CM | POA: Diagnosis not present

## 2023-12-13 DIAGNOSIS — K219 Gastro-esophageal reflux disease without esophagitis: Secondary | ICD-10-CM | POA: Diagnosis not present

## 2023-12-13 DIAGNOSIS — J9611 Chronic respiratory failure with hypoxia: Secondary | ICD-10-CM | POA: Diagnosis not present

## 2023-12-13 DIAGNOSIS — I5042 Chronic combined systolic (congestive) and diastolic (congestive) heart failure: Secondary | ICD-10-CM | POA: Diagnosis not present

## 2023-12-13 DIAGNOSIS — I959 Hypotension, unspecified: Secondary | ICD-10-CM | POA: Diagnosis not present

## 2023-12-13 DIAGNOSIS — I480 Paroxysmal atrial fibrillation: Secondary | ICD-10-CM | POA: Diagnosis not present

## 2023-12-13 DIAGNOSIS — I110 Hypertensive heart disease with heart failure: Secondary | ICD-10-CM | POA: Diagnosis not present

## 2023-12-13 DIAGNOSIS — J4489 Other specified chronic obstructive pulmonary disease: Secondary | ICD-10-CM | POA: Diagnosis not present

## 2023-12-13 NOTE — Transitions of Care (Post Inpatient/ED Visit) (Signed)
12/13/2023  Name: Lawrence [name] MRN: [id number] DOB: [birth year]  Today's TOC FU Call Status: Today's TOC FU Call Status:: Successful TOC FU Call Completed Unsuccessful Call (1st Attempt) Date: 12/13/23 [facility] FU Call Complete Date: 12/13/23 Patient's Name and Date of Birth confirmed.  Transition Care Management Follow-up Telephone Call Date of Discharge: 12/11/23 Discharge Facility: [facility]) Type of Discharge: Emergency Department Primary Inpatient Discharge Diagnosis:: other chest pain How have you been since you were released from the hospital?: Better Any questions or concerns?: No  Items Reviewed: Did you receive and understand the discharge instructions provided?: Yes Medications obtained,verified, and reconciled?: Yes (Medications Reviewed) Any new allergies since your discharge?: No Dietary orders reviewed?: Yes Do you have support at home?: Yes People in Home: grandchild(ren)  Medications Reviewed Today: Medications Reviewed Today     Reviewed by Karena Addison, LPN (Licensed Practical Nurse) on 12/13/23 at 1457  Med List Status: <None>   Medication Order Taking? Sig Documenting Provider Last Dose Status Informant  Accu-Chek Softclix Lancets lancets [id number]  Use as instructed to check sugar daily for type 2 diabetes. Malva Limes, MD  Active Pharmacy Records, Other  allopurinol (ZYLOPRIM) 300 MG tablet [id number]  Take 300 mg by mouth 2 (two) times daily. [provider]  Active   ALPRAZolam Prudy Feeler) 1 MG tablet [id number]  Take 1 tablet (1 mg total) by mouth 3 (three) times daily as needed. Malva Limes, MD  Active Pharmacy Records, Other  amLODipine (NORVASC) 10 MG tablet [id number]  Take 10 mg by mouth daily. [provider]  Active Self  amoxicillin-clavulanate (AUGMENTIN) 875-125 MG tablet [id number] Yes Take 1 tablet by mouth 2 (two) times daily. [provider]  Active   apixaban (ELIQUIS) 5 MG TABS  tablet [id number]  Take 1 tablet (5 mg total) by mouth 2 (two) times daily. Malva Limes, MD  Active Pharmacy Records, Other  atorvastatin (LIPITOR) 80 MG tablet [id number]  TAKE 1 TABLET BY MOUTH AT BEDTIME Malva Limes, MD  Active Pharmacy Records, Other  Blood Glucose Calibration (ACCU-CHEK GUIDE CONTROL) [name] [id number]  Use with blood glucose monitor as directed Malva Limes, MD  Active   Blood Glucose Monitoring Suppl (ACCU-CHEK GUIDE) w/Device KIT [id number]  Use to check blood sugars as directed Malva Limes, MD  Active   Budeson-Glycopyrrol-Formoterol (BREZTRI AEROSPHERE) 160-9-4.8 MCG/ACT [name] [id number]  Inhale 2 puffs into the lungs 2 (two) times daily. Malva Limes, MD  Active Pharmacy Records, Other  carvedilol (COREG) 3.125 MG tablet [id number] Yes Take 3.125 mg by mouth 2 (two) times daily with a meal. [provider]  Active   cetirizine (ZYRTEC) 10 MG tablet [id number]  TAKE 1 TABLET BY MOUTH AT BEDTIME Malva Limes, MD  Active Pharmacy Records, Other           Med Note Leandrew Koyanagi Oct 18, 2023 10:05 PM) Last filled 4D supply in June  Cyanocobalamin (VITAMIN B-12) 1000 MCG SUBL [id number]  Place 1 tablet under the tongue daily. [provider]  Active Pharmacy Records, Other  FARXIGA 10 MG TABS tablet [id number]  Take 10 mg by mouth daily. [provider]  Active Pharmacy Records, Other           Med Note Leandrew Koyanagi Oct 18, 2023 10:09 PM) Last filled 08/16/23 for 30 days.   glucose blood (ACCU-CHEK GUIDE) test strip [id number]  Use as instructed to check  sugar daily for type 2 diabetes. Malva Limes, MD  Active Pharmacy Records, Other  iron polysaccharides (FERREX 150) 150 MG capsule [id number]  TAKE 1 CAPSULE BY MOUTH DAILY Malva Limes, MD  Active Pharmacy Records, Other  isosorbide mononitrate (IMDUR) 60 MG 24 hr tablet [id number]  Take 1 tablet (60 mg total) by mouth daily. Lurene Shadow, MD  Active   lidocaine  (LIDODERM) 5 % [id number]  Place 1 patch onto the skin daily. Remove & Discard patch within 12 hours or as directed by MD Arnetha Courser, MD  Active Pharmacy Records, Other  metFORMIN (GLUCOPHAGE-XR) 500 MG 24 hr tablet [id number]  Take 1 tablet (500 mg total) by mouth 2 (two) times daily. Jacky Kindle, FNP  Active Pharmacy Records, Other           Med Note [facility], Robyn Haber   Fri Oct 01, 2023  4:16 PM) Reports taking one a day  methocarbamol (ROBAXIN) 500 MG tablet [id number]  TAKE 1 TABLET BY MOUTH EVERY 8 HOURS AS NEEDED FOR MUSCLE SPASMS Malva Limes, MD  Active Pharmacy Records, Other           Med Note Lorenso Courier, Marjory Lies   Thu Aug 26, 2023  1:48 PM) Up to 4x a day  nitroGLYCERIN (NITROSTAT) 0.4 MG SL tablet [id number]  Place 1 tablet (0.4 mg total) under the tongue every 5 (five) minutes as needed for chest pain (Do not take if blood pressure is low, systolic BP<110). Lurene Shadow, MD  Active   omega-3 acid ethyl esters (LOVAZA) 1 g capsule [id number]  TAKE 4 CAPSULES (4 GRAMS TOTAL) BY Miachel Roux, MD  Active Pharmacy Records, Other  oxyCODONE-acetaminophen (PERCOCET) 10-325 MG tablet [id number]  TAKE 1 TABLET BY MOUTH EVERY 4 HOURS AS NEEDED FOR PAIN. Malva Limes, MD  Active   OXYGEN [id number]  Inhale 2 L into the lungs at bedtime as needed (for shortness of breath). [provider]  Active Pharmacy Records, Other  pantoprazole (PROTONIX) 40 MG tablet [id number]  Take 1 tablet (40 mg total) by mouth 2 (two) times daily for 14 days. Celso Amy, PA-C  Expired 08/07/23 2359 Self, Pharmacy Records           Med Note Leandrew Koyanagi Oct 18, 2023 10:01 PM) Last filled in July for 14 days  ranolazine (RANEXA) 1000 MG SR tablet [id number]  Take 1 tablet (1,000 mg total) by mouth 2 (two) times daily. Marcelino Duster, MD  Active Pharmacy Records, Other  sacubitril-valsartan [facility]) 24-26 [place] [id number]  Take 1 tablet by mouth 2 (two) times daily. [provider]  Active Self  sennosides-docusate sodium (SENOKOT-S) 8.6-50 MG tablet [id number]  Take 1 tablet by mouth daily. [provider]  Active Self  spironolactone (ALDACTONE) 25 MG tablet [id number]  Take 25 mg by mouth daily. [provider]  Active Self  sucralfate (CARAFATE) 1 GM/10ML suspension [id number]  Take 10 mLs (1 g total) by mouth 4 (four) times daily. Pilar Jarvis, MD  Active   traZODone (DESYREL) 100 MG tablet [id number]  Take 1 tablet (100 mg total) by mouth at bedtime as needed for sleep. Malva Limes, MD  Active   venlafaxine XR (EFFEXOR-XR) 75 MG 24 hr capsule [id number]  Take 75 mg by mouth daily with breakfast. [provider]  Active Pharmacy Records, Other            Home Care and Equipment/Supplies: Were Home Health  Services Ordered?: NA Any new equipment or medical supplies ordered?: NA  Functional Questionnaire: Do you need assistance with bathing/showering or dressing?: No Do you need assistance with meal preparation?: No Do you need assistance with eating?: No Do you have difficulty maintaining continence: No Do you need assistance with getting out of bed/getting out of a chair/moving?: No Do you have difficulty managing or taking your medications?: No  Follow up appointments reviewed: PCP Follow-up appointment confirmed?: Yes Date of PCP follow-up appointment?: 12/22/23 Follow-up Provider: [facility] Follow-up appointment confirmed?: NA Do you need transportation to your follow-up appointment?: No Do you understand care options if your condition(s) worsen?: Yes-patient verbalized understanding    SIGNATURE Karena Addison, LPN [facility] Nurse Health Advisor Direct Dial [phone]

## 2023-12-13 NOTE — Transitions of Care (Post Inpatient/ED Visit) (Signed)
   12/13/2023  Name: Lawrence [name] MRN: [id number] DOB: 03-16-1956  Today's TOC FU Call Status: Unsuccessful Call (1st Attempt) Date: 12/13/23  Attempted to reach the patient regarding the most recent Inpatient/ED visit.  Follow Up Plan: Additional outreach attempts will be made to reach the patient to complete the Transitions of Care (Post Inpatient/ED visit) call.   Signature Karena Addison, LPN [facility] Nurse Health Advisor Direct Dial [phone]

## 2023-12-15 ENCOUNTER — Telehealth: Payer: Self-pay

## 2023-12-15 NOTE — Telephone Encounter (Signed)
Copied from CRM [id number]. Topic: General - Other >> Dec 13, 2023  1:24 PM Santiya F wrote: Reason for CRM: Dawn a home health aid with [facility] is calling in because on Saturday morning pt was walking to the bathroom without his walker and he fell. He has marks on his chest, left arm, and left shoulder blade. Pt did not go to the hospital or anything.

## 2023-12-16 ENCOUNTER — Other Ambulatory Visit: Payer: Self-pay

## 2023-12-17 ENCOUNTER — Telehealth: Payer: Self-pay | Admitting: Family Medicine

## 2023-12-17 DIAGNOSIS — I480 Paroxysmal atrial fibrillation: Secondary | ICD-10-CM | POA: Diagnosis not present

## 2023-12-17 DIAGNOSIS — K219 Gastro-esophageal reflux disease without esophagitis: Secondary | ICD-10-CM | POA: Diagnosis not present

## 2023-12-17 DIAGNOSIS — I2510 Atherosclerotic heart disease of native coronary artery without angina pectoris: Secondary | ICD-10-CM | POA: Diagnosis not present

## 2023-12-17 DIAGNOSIS — J9611 Chronic respiratory failure with hypoxia: Secondary | ICD-10-CM | POA: Diagnosis not present

## 2023-12-17 DIAGNOSIS — I5042 Chronic combined systolic (congestive) and diastolic (congestive) heart failure: Secondary | ICD-10-CM | POA: Diagnosis not present

## 2023-12-17 DIAGNOSIS — I110 Hypertensive heart disease with heart failure: Secondary | ICD-10-CM | POA: Diagnosis not present

## 2023-12-17 DIAGNOSIS — I959 Hypotension, unspecified: Secondary | ICD-10-CM | POA: Diagnosis not present

## 2023-12-17 DIAGNOSIS — J4489 Other specified chronic obstructive pulmonary disease: Secondary | ICD-10-CM | POA: Diagnosis not present

## 2023-12-17 DIAGNOSIS — E119 Type 2 diabetes mellitus without complications: Secondary | ICD-10-CM | POA: Diagnosis not present

## 2023-12-17 NOTE — Telephone Encounter (Signed)
Order printed, signed and sent to medical records to be faxed to [facility]

## 2023-12-17 NOTE — Patient Outreach (Signed)
  Care Management   Visit Note   Name: [name] MRN: [id number] DOB: Mar 16, 1956  Subjective: Lawrence [name] is a 68 y.o. year old male who is a primary care patient of [name], Demetrios Isaacs, MD. The Care Management team was consulted for assistance.      Care Coordination: Collaboration with [facility]

## 2023-12-17 NOTE — Telephone Encounter (Signed)
RN Dorene Sorrow is calling stating that pt needs cpap asap. I see that you have a message to have a new sleep study done but RN is stating that he needs help now. She states that pt is not sleeping and oxygen levels are 92 and bp is 88/64. Please advise. Pt is being discharged today.

## 2023-12-20 ENCOUNTER — Emergency Department
Admission: EM | Admit: 2023-12-20 | Discharge: 2023-12-20 | Disposition: A | Discharge status: Home / Self Care | Payer: Medicare HMO | Attending: Emergency Medicine | Admitting: Emergency Medicine

## 2023-12-20 ENCOUNTER — Other Ambulatory Visit: Payer: Self-pay

## 2023-12-20 ENCOUNTER — Emergency Department: Payer: Medicare HMO

## 2023-12-20 DIAGNOSIS — L03113 Cellulitis of right upper limb: Secondary | ICD-10-CM | POA: Diagnosis not present

## 2023-12-20 DIAGNOSIS — L03114 Cellulitis of left upper limb: Secondary | ICD-10-CM | POA: Diagnosis not present

## 2023-12-20 DIAGNOSIS — I2510 Atherosclerotic heart disease of native coronary artery without angina pectoris: Secondary | ICD-10-CM | POA: Insufficient documentation

## 2023-12-20 DIAGNOSIS — I10 Essential (primary) hypertension: Secondary | ICD-10-CM | POA: Diagnosis not present

## 2023-12-20 DIAGNOSIS — Z7901 Long term (current) use of anticoagulants: Secondary | ICD-10-CM | POA: Insufficient documentation

## 2023-12-20 DIAGNOSIS — R21 Rash and other nonspecific skin eruption: Secondary | ICD-10-CM | POA: Diagnosis not present

## 2023-12-20 DIAGNOSIS — R079 Chest pain, unspecified: Secondary | ICD-10-CM | POA: Diagnosis not present

## 2023-12-20 DIAGNOSIS — Z955 Presence of coronary angioplasty implant and graft: Secondary | ICD-10-CM | POA: Diagnosis not present

## 2023-12-20 DIAGNOSIS — L0390 Cellulitis, unspecified: Secondary | ICD-10-CM

## 2023-12-20 DIAGNOSIS — R0789 Other chest pain: Secondary | ICD-10-CM | POA: Diagnosis not present

## 2023-12-20 DIAGNOSIS — L03313 Cellulitis of chest wall: Secondary | ICD-10-CM | POA: Diagnosis not present

## 2023-12-20 LAB — HEPATIC FUNCTION PANEL
ALT: 14 U/L (ref 0–44)
AST: 13 U/L — ABNORMAL LOW (ref 15–41)
Albumin: 3.4 g/dL — ABNORMAL LOW (ref 3.5–5.0)
Alkaline Phosphatase: 57 U/L (ref 38–126)
Bilirubin, Direct: <0.1 mg/dL (ref 0.0–0.2)
Total Bilirubin: 0.5 mg/dL (ref 0.0–1.2)
Total Protein: 6.1 g/dL — ABNORMAL LOW (ref 6.5–8.1)

## 2023-12-20 LAB — TROPONIN I (HIGH SENSITIVITY)
Troponin I (High Sensitivity): 15 ng/L (ref <18)
Troponin I (High Sensitivity): 15 ng/L (ref <18)

## 2023-12-20 LAB — CBC
HCT: 48.1 % (ref 39.0–52.0)
Hemoglobin: 15.5 g/dL (ref 13.0–17.0)
MCH: 28.8 pg (ref 26.0–34.0)
MCHC: 32.2 g/dL (ref 30.0–36.0)
MCV: 89.4 fL (ref 80.0–100.0)
Platelets: 231 10*3/uL (ref 150–400)
RBC: 5.38 MIL/uL (ref 4.22–5.81)
RDW: 14.8 % (ref 11.5–15.5)
WBC: 12 10*3/uL — ABNORMAL HIGH (ref 4.0–10.5)
nRBC: 0 % (ref 0.0–0.2)

## 2023-12-20 LAB — BASIC METABOLIC PANEL
Anion gap: 12 (ref 5–15)
BUN: 11 mg/dL (ref 8–23)
CO2: 29 mmol/L (ref 22–32)
Calcium: 9.3 mg/dL (ref 8.9–10.3)
Chloride: 102 mmol/L (ref 98–111)
Creatinine, Ser: 0.78 mg/dL (ref 0.61–1.24)
GFR, Estimated: >60 mL/min (ref >60)
Glucose, Bld: 137 mg/dL — ABNORMAL HIGH (ref 70–99)
Potassium: 3.8 mmol/L (ref 3.5–5.1)
Sodium: 143 mmol/L (ref 135–145)

## 2023-12-20 MED ORDER — CEPHALEXIN 500 MG PO CAPS: 500.0000 mg | ORAL_CAPSULE | Freq: Two times a day (BID) | ORAL | 0 refills | Status: AC

## 2023-12-20 MED ORDER — CARVEDILOL 6.25 MG PO TABS
3.1250 mg | ORAL_TABLET | Freq: Once | ORAL | Status: AC
  Administered 2023-12-20: 3.125 mg via ORAL
  Filled 2023-12-20: qty 1

## 2023-12-20 MED ORDER — HYDROXYZINE PAMOATE 25 MG PO CAPS: 25.0000 mg | ORAL_CAPSULE | Freq: Three times a day (TID) | ORAL | 0 refills | Status: DC | PRN

## 2023-12-20 MED ORDER — OXYCODONE HCL 5 MG PO TABS
10.0000 mg | ORAL_TABLET | Freq: Once | ORAL | Status: AC
  Administered 2023-12-20: 10 mg via ORAL
  Filled 2023-12-20: qty 2

## 2023-12-20 NOTE — ED Notes (Signed)
Patient has red scattered rash generalized.  Patient complaining of back pain.  Denies SOB.  No signs of distress.  Voiding appropriately.  Alert and oriented. Denies the ability to ambulate.

## 2023-12-20 NOTE — ED Provider Notes (Signed)
Care assumed of patient from outgoing provider.  See their note for initial history, exam and plan.  Past medical history significant for multiple ED visits for chest pain.  Patient has had a history of coronary artery disease.  Echocardiogram with low EF.  No significant shortness of breath.  No signs of heart failure.  Patient arrived with mild tachycardia and hypertension.  Given his home diltiazem.  Patient initially was complaining of a rash however later with complaints of chest pain.  Initial troponin is negative and waiting for serial troponin.  EKG without findings of acute ischemia or dysrhythmia.  Low suspicion for zoster.  Plan for serial troponins and if negative plan to discharge home with cardiology and primary care follow-up.      Corena Herter, MD 12/20/23 [phone]

## 2023-12-20 NOTE — ED Triage Notes (Signed)
Pt now reporting chest pain. Additional orders added for cardiac work-up

## 2023-12-20 NOTE — ED Triage Notes (Addendum)
Patient C/O painful body-wide rash that started today. Patient states that his son told him that he has shingles. Denies any fevers, nausea, or vomiting. Several areas of small healing scabs were noted, but patient states that he always gets those, but the red rash is new.

## 2023-12-20 NOTE — ED Notes (Signed)
Patient takes Augmentin 875/125.

## 2023-12-20 NOTE — Discharge Instructions (Signed)
I prescribed some medication that could help with itching.  Support they do not pick at your skin.  Have also prescribed some Keflex to help with a potential infection of your skin.  I think you should stop taking the Augmentin in case you could have had a reaction to it but this seems less likely.   Return to the ER for worsening symptoms or any other concerns

## 2023-12-20 NOTE — ED Provider Notes (Signed)
[facility] Provider Note    Event Date/Time   First MD Initiated Contact with Patient 12/20/23 [phone]     (approximate)   History   Rash   HPI  Lawrence [name] is a 68 y.o. male with known chronic chest pain, A-fib on Eliquis who comes in with concerns for rash patient has chronic chest pain.  I see him very frequently for this.  He continues to talk about the chest pain although his chief complaint was initially rash.  When asked patient about the rash he states that he has a rash on his arms and parts of his back.  He states that his son thought it was shingles.  When I have seen him previously he has had a rash on his face that he states was from him scratching his face.  He denies any fevers.   Physical Exam   Triage Vital Signs: ED Triage Vitals [12/20/23 0217]  Encounter Vitals Group     BP (!) 166/112     Systolic BP Percentile      Diastolic BP Percentile      Pulse Rate 70     Resp 18     Temp 98.2 F (36.8 C)     Temp Source Oral     SpO2      Weight      Height      Head Circumference      Peak Flow      Pain Score      Pain Loc      Pain Education      Exclude from Growth Chart     Most recent vital signs: Vitals:   12/20/23 0217  BP: (!) 166/112  Pulse: 70  Resp: 18  Temp: 98.2 F (36.8 C)     General: Awake, no distress.  CV:  Good peripheral perfusion.  Resp:  Normal effort.  Abd:  No distention.  Other:  Patient has multiple areas of picked skin and with some overlying potential cellulitis on his arms.  He also has some areas on his upper shoulders.  His legs are mostly clear.  No lesions in his mouth   ED Results / Procedures / Treatments   Labs (all labs ordered are listed, but only abnormal results are displayed) Labs Reviewed  CBC - Abnormal; Notable for the following components:      Result Value   WBC 12.0 (*)    All other components within normal limits  BASIC METABOLIC PANEL - Abnormal; Notable for  the following components:   Glucose, Bld 137 (*)    All other components within normal limits  TROPONIN I (HIGH SENSITIVITY)     EKG  My interpretation of EKG:  Atrial fibrillation with a rate of 109 without any ST elevation, no T wave inversions except for lead III, normal intervals.  Similar to prior  RADIOLOGY I have reviewed the xray personally and interpreted and no pneumonia  PROCEDURES:  Critical Care performed:   Procedures   MEDICATIONS ORDERED IN ED: Medications  oxyCODONE (Oxy IR/ROXICODONE) immediate release tablet 10 mg (10 mg Oral Given 12/20/23 0634)  carvedilol (COREG) tablet 3.125 mg (3.125 mg Oral Given 12/20/23 9604)     IMPRESSION / MDM / ASSESSMENT AND PLAN / ED COURSE  I reviewed the triage vital signs and the nursing notes.   Patient's presentation is most consistent with acute presentation with potential threat to life or bodily function.   Patient comes  in with concerns for chest pain.  Known chronic chest pain patient heart rate slightly elevated on EKG will give him his home carvedilol.  Cardiac markers ordered evaluate for ACS.  Patient does have notable rash on the tops of his arms.  He has had a rashes when I have seen him previously on his face that he reported was from scratching.  I suspect that this is from picking at his skin and potential overlying cellulitis.  Patient will need to be started on antibiotics.  Patient is not septic.  Patient was given his home pain medication. Not consistent with SJS or other more acute rash.   Initial troponin negative CBC shows slightly elevated white count BMP normal\  Cardiac markers are negative x 2  Patient states that he thinks this started when he started Augmentin.  He states he thinks it was related to a pneumonia or cough.  He states that this is resolved.  Continues to have his chronic chest pain.  Will recommend that he discontinue the Augmentin but to me this looks more like a concurrent  cellulitis than a allergic reaction.        FINAL CLINICAL IMPRESSION(S) / ED DIAGNOSES   Final diagnoses:  Cellulitis, unspecified cellulitis site     Rx / DC Orders   ED Discharge Orders     None        Note:  This document was prepared using Dragon voice recognition software and may include unintentional dictation errors.   Concha Se, MD 12/20/23 [phone]

## 2023-12-20 NOTE — ED Triage Notes (Signed)
Pt presents via EMS c/o all over body pain. Pt son reports pt has shingles.   Pt 98% on RA per EMS. Per EMS pt wears 2L 02 at baseline.   A&O x4.

## 2023-12-21 ENCOUNTER — Telehealth: Payer: Self-pay

## 2023-12-21 DIAGNOSIS — F321 Major depressive disorder, single episode, moderate: Secondary | ICD-10-CM | POA: Diagnosis not present

## 2023-12-21 NOTE — Transitions of Care (Post Inpatient/ED Visit) (Signed)
 12/21/2023  Name: Lawrence [name] MRN: [id number] DOB: 12/19/1955  Today's TOC FU Call Status: Today's TOC FU Call Status:: Successful TOC FU Call Completed TOC FU Call Complete Date: 12/21/23 Patient's Name and Date of Birth confirmed.  Transition Care Management Follow-up Telephone Call Date of Discharge: 12/20/23 Discharge Facility: [facility]) Type of Discharge: Emergency Department Primary Inpatient Discharge Diagnosis:: cellulitis How have you been since you were released from the hospital?: Same Any questions or concerns?: No  Items Reviewed: Did you receive and understand the discharge instructions provided?: Yes Medications obtained,verified, and reconciled?: Yes (Medications Reviewed) Any new allergies since your discharge?: No Dietary orders reviewed?: Yes Do you have support at home?: Yes People in Home: grandchild(ren)  Medications Reviewed Today: Medications Reviewed Today     Reviewed by Emmitt Pan, LPN (Licensed Practical Nurse) on 12/21/23 at 1632  Med List Status: <None>   Medication Order Taking? Sig Documenting Provider Last Dose Status Informant  Accu-Chek Softclix Lancets lancets [id number] No Use as instructed to check sugar daily for type 2 diabetes. [name], MD Taking Active Pharmacy Records, Other  allopurinol  (ZYLOPRIM ) 300 MG tablet [id number] No Take 300 mg by mouth 2 (two) times daily. [provider] Taking Active   ALPRAZolam  (XANAX ) 1 MG tablet [id number] No Take 1 tablet (1 mg total) by mouth 3 (three) times daily as needed. [name], MD Taking Active Pharmacy Records, Other  amLODipine  (NORVASC ) 10 MG tablet [id number] No Take 10 mg by mouth daily. [provider] Taking Active Self  apixaban  (ELIQUIS ) 5 MG TABS tablet [id number] No Take 1 tablet (5 mg total) by mouth 2 (two) times daily. [name], MD Taking Active Pharmacy Records, Other  atorvastatin  (LIPITOR ) 80 MG tablet  [id number] No TAKE 1 TABLET BY MOUTH AT BEDTIME [name], MD Taking Active Pharmacy Records, Other  Blood Glucose Calibration (ACCU-CHEK GUIDE CONTROL) LIQD [id number]  Use with blood glucose monitor as directed [name], MD  Active   Blood Glucose Monitoring Suppl (ACCU-CHEK GUIDE) w/Device KIT [id number]  Use to check blood sugars as directed [name], MD  Active   Budeson-Glycopyrrol-Formoterol  (BREZTRI  AEROSPHERE) 160-9-4.8 MCG/ACT AERO [id number] No Inhale 2 puffs into the lungs 2 (two) times daily. [name], MD Taking Active Pharmacy Records, Other  cephALEXin  (KEFLEX ) 500 MG capsule [id number]  Take 1 capsule (500 mg total) by mouth 2 (two) times daily for 7 days. Ernest Ronal BRAVO, MD  Active   cetirizine  (ZYRTEC ) 10 MG tablet [id number] No TAKE 1 TABLET BY MOUTH AT BEDTIME [name], MD Taking Active Pharmacy Records, Other           Med Note (BEJOS, ANJA J   Mon Oct 18, 2023 10:05 PM) Last filled 4D supply in June  Cyanocobalamin  (VITAMIN B-12) 1000 MCG SUBL [id number] No Place 1 tablet under the tongue daily. [provider] Unknown Unknown Active Pharmacy Records, Other  FARXIGA  10 MG TABS tablet [id number] No Take 10 mg by mouth daily. [provider] Taking Active Pharmacy Records, Other           Med Note GAYLENE DARVIN JINNY Pablo Oct 18, 2023 10:09 PM) Last filled 08/16/23 for 30 days.   glucose blood (ACCU-CHEK GUIDE) test strip [id number] No Use as instructed to check sugar daily for type 2 diabetes. [name], MD Taking Active Pharmacy Records, Other  hydrOXYzine  (VISTARIL ) 25 MG capsule [id number]  Take 1 capsule (  25 mg total) by mouth 3 (three) times daily as needed. Ernest Ronal BRAVO, MD  Active   iron  polysaccharides (FERREX 150) 150 MG capsule [id number] No TAKE 1 CAPSULE BY MOUTH DAILY [name], Nancyann BRAVO, MD Unknown Unknown Active Pharmacy Records, Other  isosorbide  mononitrate (IMDUR ) 60 MG 24 hr tablet [id number] No Take 1 tablet (60 mg  total) by mouth daily. [name], MD Taking Active   lidocaine  (LIDODERM ) 5 % [id number] No Place 1 patch onto the skin daily. Remove & Discard patch within 12 hours or as directed by MD Caleen Qualia, MD Unknown Unknown Active Pharmacy Records, Other  metFORMIN  (GLUCOPHAGE -XR) 500 MG 24 hr tablet [id number] No Take 1 tablet (500 mg total) by mouth 2 (two) times daily. Emilio Kelly DASEN, FNP Taking Active Pharmacy Records, Other           Med Note [facility], JACKSON SAILOR   Fri Oct 01, 2023  4:16 PM) Reports taking one a day  methocarbamol  (ROBAXIN ) 500 MG tablet [id number] No TAKE 1 TABLET BY MOUTH EVERY 8 HOURS AS NEEDED FOR MUSCLE SPASMS [name], MD Taking Active Pharmacy Records, Other           Med Note ELENA, ALOYSIUS HERO   Thu Aug 26, 2023  1:48 PM) Up to 4x a day  nitroGLYCERIN  (NITROSTAT ) 0.4 MG SL tablet [id number] No Place 1 tablet (0.4 mg total) under the tongue every 5 (five) minutes as needed for chest pain (Do not take if blood pressure is low, systolic BP<110). [name], MD Taking Active   omega-3 acid ethyl esters (LOVAZA ) 1 g capsule [id number] No TAKE 4 CAPSULES (4 GRAMS TOTAL) BY WARDEN [name], MD Taking Active Pharmacy Records, Other  oxyCODONE -acetaminophen  (PERCOCET) 10-325 MG tablet [id number]  TAKE 1 TABLET BY MOUTH EVERY 4 HOURS AS NEEDED FOR PAIN. [name], MD  Active   OXYGEN  [id number] No Inhale 2 L into the lungs at bedtime as needed (for shortness of breath). [provider] Taking Active Pharmacy Records, Other  pantoprazole  (PROTONIX ) 40 MG tablet [id number] No Take 1 tablet (40 mg total) by mouth 2 (two) times daily for 14 days. [place], PA-C Past Week Expired 08/07/23 2359 Self, Pharmacy Records           Med Note GAYLENE DARVIN JINNY Pablo Oct 18, 2023 10:01 PM) Last filled in July for 14 days  ranolazine  (RANEXA ) 1000 MG SR tablet [id number] No Take 1 tablet (1,000 mg total) by mouth 2 (two) times daily. Darci Pore, MD Taking  Active Pharmacy Records, Other  sacubitril -valsartan  (ENTRESTO ) 24-26 MG [id number] No Take 1 tablet by mouth 2 (two) times daily. [provider] Taking Active Self  sennosides-docusate sodium  (SENOKOT-S) 8.6-50 MG tablet [id number] No Take 1 tablet by mouth daily. [provider] Taking Active Self  spironolactone  (ALDACTONE ) 25 MG tablet [id number] No Take 25 mg by mouth daily. [provider] Taking Active Self  sucralfate  (CARAFATE ) 1 GM/10ML suspension [id number]  Take 10 mLs (1 g total) by mouth 4 (four) times daily. Cyrena Mylar, MD  Active   traZODone  (DESYREL ) 100 MG tablet [id number]  Take 1 tablet (100 mg total) by mouth at bedtime as needed for sleep. [name], MD  Active   venlafaxine  XR (EFFEXOR -XR) 75 MG 24 hr capsule [id number] No Take 75 mg by mouth daily with breakfast. [provider] Taking Active Pharmacy Records, Other            Home  Care and Equipment/Supplies: Were Home Health Services Ordered?: NA Any new equipment or medical supplies ordered?: NA  Functional Questionnaire: Do you need assistance with bathing/showering or dressing?: No Do you need assistance with meal preparation?: No Do you need assistance with eating?: No Do you have difficulty maintaining continence: No Do you need assistance with getting out of bed/getting out of a chair/moving?: No Do you have difficulty managing or taking your medications?: No  Follow up appointments reviewed: PCP Follow-up appointment confirmed?: Yes Date of PCP follow-up appointment?: 12/22/23 Follow-up Provider: [facility] Follow-up appointment confirmed?: NA Do you need transportation to your follow-up appointment?: No Do you understand care options if your condition(s) worsen?: Yes-patient verbalized understanding    SIGNATURE Julian Lemmings, LPN [facility] Nurse Health Advisor Direct Dial [phone]

## 2023-12-22 ENCOUNTER — Inpatient Hospital Stay: Payer: Self-pay | Admitting: Family Medicine

## 2023-12-23 ENCOUNTER — Other Ambulatory Visit: Payer: Self-pay | Admitting: Family Medicine

## 2023-12-23 ENCOUNTER — Other Ambulatory Visit: Payer: Self-pay

## 2023-12-23 NOTE — Patient Instructions (Signed)
 Thank you for allowing the Care Management team to participate in your care.  It was great speaking with you today!

## 2023-12-23 NOTE — Patient Outreach (Signed)
 Care Management   Visit Note  12/23/2023 Name: Lawrence [name] MRN: [id number] DOB: 1956/09/05  Subjective: Lawrence [name] is a 68 y.o. year old male who is a primary care patient of [name], Nancyann BRAVO, MD. The Care Management team was consulted for assistance.      Engaged with Lawrence [name] and his grandson/caregiver Lawrence [name] via telephone.  Assessment:  Review of patient past medical history, allergies, medications, health status, including review of consultants reports, laboratory and other test data, was performed as part of  evaluation and provision of care management services.    Outpatient Encounter Medications as of 12/23/2023  Medication Sig Note   Accu-Chek Softclix Lancets lancets Use as instructed to check sugar daily for type 2 diabetes.    allopurinol  (ZYLOPRIM ) 300 MG tablet Take 300 mg by mouth 2 (two) times daily.    ALPRAZolam  (XANAX ) 1 MG tablet Take 1 tablet (1 mg total) by mouth 3 (three) times daily as needed.    amLODipine  (NORVASC ) 10 MG tablet Take 10 mg by mouth daily.    apixaban  (ELIQUIS ) 5 MG TABS tablet Take 1 tablet (5 mg total) by mouth 2 (two) times daily.    atorvastatin  (LIPITOR ) 80 MG tablet TAKE 1 TABLET BY MOUTH AT BEDTIME    Blood Glucose Calibration (ACCU-CHEK GUIDE CONTROL) LIQD Use with blood glucose monitor as directed    Blood Glucose Monitoring Suppl (ACCU-CHEK GUIDE) w/Device KIT Use to check blood sugars as directed    Budeson-Glycopyrrol-Formoterol  (BREZTRI  AEROSPHERE) 160-9-4.8 MCG/ACT AERO Inhale 2 puffs into the lungs 2 (two) times daily.    cephALEXin  (KEFLEX ) 500 MG capsule Take 1 capsule (500 mg total) by mouth 2 (two) times daily for 7 days.    cetirizine  (ZYRTEC ) 10 MG tablet TAKE 1 TABLET BY MOUTH AT BEDTIME 10/18/2023: Last filled 4D supply in June   Cyanocobalamin  (VITAMIN B-12) 1000 MCG SUBL Place 1 tablet under the tongue daily.    FARXIGA  10 MG TABS tablet Take 10 mg by mouth daily. 10/18/2023: Last filled 08/16/23 for 30 days.     glucose blood (ACCU-CHEK GUIDE) test strip Use as instructed to check sugar daily for type 2 diabetes.    hydrOXYzine  (VISTARIL ) 25 MG capsule Take 1 capsule (25 mg total) by mouth 3 (three) times daily as needed.    iron  polysaccharides (FERREX 150) 150 MG capsule TAKE 1 CAPSULE BY MOUTH DAILY    isosorbide  mononitrate (IMDUR ) 60 MG 24 hr tablet Take 1 tablet (60 mg total) by mouth daily.    lidocaine  (LIDODERM ) 5 % Place 1 patch onto the skin daily. Remove & Discard patch within 12 hours or as directed by MD    metFORMIN  (GLUCOPHAGE -XR) 500 MG 24 hr tablet Take 1 tablet (500 mg total) by mouth 2 (two) times daily. 10/01/2023: Reports taking one a day   methocarbamol  (ROBAXIN ) 500 MG tablet TAKE 1 TABLET BY MOUTH EVERY 8 HOURS AS NEEDED FOR MUSCLE SPASMS 08/26/2023: Up to 4x a day   nitroGLYCERIN  (NITROSTAT ) 0.4 MG SL tablet Place 1 tablet (0.4 mg total) under the tongue every 5 (five) minutes as needed for chest pain (Do not take if blood pressure is low, systolic BP<110).    omega-3 acid ethyl esters (LOVAZA ) 1 g capsule TAKE 4 CAPSULES (4 GRAMS TOTAL) BY MOUTHDAILY    oxyCODONE -acetaminophen  (PERCOCET) 10-325 MG tablet TAKE 1 TABLET BY MOUTH EVERY 4 HOURS AS NEEDED FOR PAIN.    OXYGEN  Inhale 2 L into the lungs at bedtime as needed (  for shortness of breath).    pantoprazole  (PROTONIX ) 40 MG tablet Take 1 tablet (40 mg total) by mouth 2 (two) times daily for 14 days. 10/18/2023: Last filled in July for 14 days   ranolazine  (RANEXA ) 1000 MG SR tablet Take 1 tablet (1,000 mg total) by mouth 2 (two) times daily.    sacubitril -valsartan  (ENTRESTO ) 24-26 MG Take 1 tablet by mouth 2 (two) times daily.    sennosides-docusate sodium  (SENOKOT-S) 8.6-50 MG tablet Take 1 tablet by mouth daily.    spironolactone  (ALDACTONE ) 25 MG tablet Take 25 mg by mouth daily.    sucralfate  (CARAFATE ) 1 GM/10ML suspension Take 10 mLs (1 g total) by mouth 4 (four) times daily.    traZODone  (DESYREL ) 100 MG tablet Take 1  tablet (100 mg total) by mouth at bedtime as needed for sleep.    venlafaxine  XR (EFFEXOR -XR) 75 MG 24 hr capsule Take 75 mg by mouth daily with breakfast.    No facility-administered encounter medications on file as of 12/23/2023.      Goals Addressed             This Visit's Progress    Palliative Care       Planned Interventions:  Reviewed current treatment plans. Lawrence [name] continues to require frequent visits to the Emergency Room for nonspecific and atypical chest pain. He is currently receiving home health services, however plan is to discharge this month. Reviewed current care needs. Lawrence [name] mobility and functional status has significantly declined. His grandson is currently available to assist in the home. RNCM has discussed options for Palliative Care services with Lawrence [name]. He was agreeable. A referral was previously submitted by the inpatient team, however Lawrence [name] was discharged before being completing the evaluation with the Palliative Care team.  Discussed options for in-home provider care due to declining health, functional status and mobility limitations.  Will follow up with the home health team to determine if his services will end or if they will be extended. Will submit request for evaluation by the Palliative Care/In-home provider once his home health case closure is confirmed. Update 12/23/23:  Follow up with Lawrence [name] regarding plan for transition to an in-home provider. He and his grandson Lawrence [name] remain agreeable to this plan. He has been discharged from home health services. Discussed goals of care and functional status. Reports his primary concern is generalized weakness and unresolved chest pain. He has not been able to complete all of the Cardiology appointments and ordered procedures d/t limited mobility and weakness. He prefers to remain in his current apartment until he can secure a larger apartment home. He does not feel a higher acuity of care(assisted  living) in necessary at this time. Thorough review of safety and fall prevention measures along with worsening s/sx that require immediate medical attention.  Referral will be placed for transition to in-home services with [facility]/Dr. Asenso.         PLAN Will follow up within the next week    Jackson Acron [facility] RN Care Manager Direct Dial: [phone]  Fax: [phone] Website: [email or web address]

## 2023-12-23 NOTE — Patient Outreach (Signed)
 Error Please Disregard

## 2023-12-24 ENCOUNTER — Other Ambulatory Visit: Payer: Self-pay

## 2023-12-24 ENCOUNTER — Ambulatory Visit: Payer: Self-pay | Admitting: *Deleted

## 2023-12-24 DIAGNOSIS — E119 Type 2 diabetes mellitus without complications: Secondary | ICD-10-CM | POA: Diagnosis not present

## 2023-12-24 DIAGNOSIS — R0789 Other chest pain: Secondary | ICD-10-CM | POA: Diagnosis not present

## 2023-12-24 DIAGNOSIS — J449 Chronic obstructive pulmonary disease, unspecified: Secondary | ICD-10-CM | POA: Diagnosis not present

## 2023-12-24 DIAGNOSIS — I2510 Atherosclerotic heart disease of native coronary artery without angina pectoris: Secondary | ICD-10-CM | POA: Diagnosis not present

## 2023-12-24 DIAGNOSIS — R079 Chest pain, unspecified: Secondary | ICD-10-CM | POA: Diagnosis not present

## 2023-12-24 DIAGNOSIS — I4891 Unspecified atrial fibrillation: Secondary | ICD-10-CM | POA: Diagnosis not present

## 2023-12-24 DIAGNOSIS — I110 Hypertensive heart disease with heart failure: Secondary | ICD-10-CM | POA: Diagnosis not present

## 2023-12-24 DIAGNOSIS — E785 Hyperlipidemia, unspecified: Secondary | ICD-10-CM | POA: Diagnosis not present

## 2023-12-24 DIAGNOSIS — I509 Heart failure, unspecified: Secondary | ICD-10-CM | POA: Diagnosis not present

## 2023-12-24 DIAGNOSIS — Z66 Do not resuscitate: Secondary | ICD-10-CM | POA: Diagnosis not present

## 2023-12-24 NOTE — Telephone Encounter (Signed)
 Requested medication (s) are due for refill today: yes  Requested medication (s) are on the active medication list: yes  Last refill:  10/21/23   Future visit scheduled: yes  Notes to clinic:  reordered last at hospital discharge   Requested Prescriptions  Pending Prescriptions Disp Refills   isosorbide  mononitrate (IMDUR ) 60 MG 24 hr tablet [Pharmacy Med Name: ISOSORBIDE  MONONITRATE ER 60 MG TAB] 30 tablet 0    Sig: TAKE 1 TABLET BY MOUTH DAILY     There is no refill protocol information for this order

## 2023-12-24 NOTE — Patient Outreach (Signed)
  Care Management   Visit Note  12/24/2023 Name: Lawrence [name] MRN: [id number] DOB: 1956-03-11  Subjective: Lawrence [name] is a 68 y.o. year old male who is a primary care patient of [name], Nancyann BRAVO, MD. The Care Management team was consulted for assistance.      Assessment:  Review of patient past medical history, allergies, medications, health status, including review of consultants reports, laboratory and other test data, was performed as part of  evaluation and provision of care management services.    Interventions:   Goals Addressed             This Visit's Progress    Palliative Care       Planned Interventions:  Reviewed current treatment plans. [name] continues to require frequent visits to the Emergency Room for nonspecific and atypical chest pain. He is currently receiving home health services, however plan is to discharge this month. Reviewed current care needs. [name] mobility and functional status has significantly declined. His grandson is currently available to assist in the home. RNCM has discussed options for Palliative Care services with Mr. Loll. He was agreeable. A referral was previously submitted by the inpatient team, however Mr. Bonzo was discharged before being completing the evaluation with the Palliative Care team.  Discussed options for in-home provider care due to declining health, functional status and mobility limitations.  Will follow up with the home health team to determine if his services will end or if they will be extended. Will submit request for evaluation by the Palliative Care/In-home provider once his home health case closure is confirmed. Update 12/23/23:  Follow up with Ms. Lawrence [name] regarding plan for transition to an in-home provider. He and his grandson [name] remain agreeable to this plan. He has been discharged from home health services. Discussed goals of care and functional status. Reports his primary concern is generalized  weakness and unresolved chest pain. He has not been able to complete all of the Cardiology appointments and ordered procedures d/t limited mobility and weakness. He prefers to remain in his current apartment until he can secure a larger apartment home. He does not feel a higher acuity of care(assisted living) in necessary at this time. Thorough review of safety and fall prevention measures along with worsening s/sx that require immediate medical attention.  Referral will be placed for transition to in-home services with [facility]/Dr. Asenso. Update 12/24/23: Message received from the [facility] Team/M. Manley. Confirmed that the referral was received and the team will contact [name] to schedule in-home visit with Dr. Asenso.          PLAN Will follow up with Mr. Sommers next week to confirm home visit and complete transition to Dr. Asenso.    Jackson Acron [facility] Population Health RN Care Manager Direct Dial: [phone]  Fax: [phone] Website: [email or web address]

## 2023-12-26 ENCOUNTER — Emergency Department (HOSPITAL_COMMUNITY)
Admission: EM | Admit: 2023-12-26 | Discharge: 2023-12-26 | Disposition: A | Discharge status: Home / Self Care | Payer: Medicare HMO | Attending: Emergency Medicine | Admitting: Emergency Medicine

## 2023-12-26 ENCOUNTER — Emergency Department (HOSPITAL_COMMUNITY): Payer: Medicare HMO

## 2023-12-26 ENCOUNTER — Encounter (HOSPITAL_COMMUNITY): Payer: Self-pay

## 2023-12-26 ENCOUNTER — Other Ambulatory Visit: Payer: Self-pay

## 2023-12-26 DIAGNOSIS — E1122 Type 2 diabetes mellitus with diabetic chronic kidney disease: Secondary | ICD-10-CM | POA: Insufficient documentation

## 2023-12-26 DIAGNOSIS — I6782 Cerebral ischemia: Secondary | ICD-10-CM | POA: Diagnosis not present

## 2023-12-26 DIAGNOSIS — N189 Chronic kidney disease, unspecified: Secondary | ICD-10-CM | POA: Diagnosis not present

## 2023-12-26 DIAGNOSIS — R0789 Other chest pain: Secondary | ICD-10-CM | POA: Diagnosis not present

## 2023-12-26 DIAGNOSIS — S0990XA Unspecified injury of head, initial encounter: Secondary | ICD-10-CM | POA: Diagnosis not present

## 2023-12-26 DIAGNOSIS — I509 Heart failure, unspecified: Secondary | ICD-10-CM | POA: Insufficient documentation

## 2023-12-26 DIAGNOSIS — D649 Anemia, unspecified: Secondary | ICD-10-CM | POA: Insufficient documentation

## 2023-12-26 DIAGNOSIS — R55 Syncope and collapse: Secondary | ICD-10-CM | POA: Insufficient documentation

## 2023-12-26 DIAGNOSIS — J45909 Unspecified asthma, uncomplicated: Secondary | ICD-10-CM | POA: Diagnosis not present

## 2023-12-26 DIAGNOSIS — M25552 Pain in left hip: Secondary | ICD-10-CM | POA: Diagnosis not present

## 2023-12-26 DIAGNOSIS — I2510 Atherosclerotic heart disease of native coronary artery without angina pectoris: Secondary | ICD-10-CM | POA: Diagnosis not present

## 2023-12-26 DIAGNOSIS — M545 Low back pain, unspecified: Secondary | ICD-10-CM | POA: Insufficient documentation

## 2023-12-26 DIAGNOSIS — I130 Hypertensive heart and chronic kidney disease with heart failure and stage 1 through stage 4 chronic kidney disease, or unspecified chronic kidney disease: Secondary | ICD-10-CM | POA: Diagnosis not present

## 2023-12-26 DIAGNOSIS — J449 Chronic obstructive pulmonary disease, unspecified: Secondary | ICD-10-CM | POA: Insufficient documentation

## 2023-12-26 DIAGNOSIS — W19XXXA Unspecified fall, initial encounter: Secondary | ICD-10-CM | POA: Insufficient documentation

## 2023-12-26 DIAGNOSIS — S7012XA Contusion of left thigh, initial encounter: Secondary | ICD-10-CM | POA: Diagnosis not present

## 2023-12-26 DIAGNOSIS — S20212A Contusion of left front wall of thorax, initial encounter: Secondary | ICD-10-CM | POA: Diagnosis not present

## 2023-12-26 DIAGNOSIS — R079 Chest pain, unspecified: Secondary | ICD-10-CM | POA: Diagnosis not present

## 2023-12-26 LAB — URINALYSIS, ROUTINE W REFLEX MICROSCOPIC
Bacteria, UA: NONE SEEN
Bilirubin Urine: NEGATIVE
Glucose, UA: >=500 mg/dL — AB
Hgb urine dipstick: NEGATIVE
Ketones, ur: NEGATIVE mg/dL
Leukocytes,Ua: NEGATIVE
Nitrite: NEGATIVE
Protein, ur: NEGATIVE mg/dL
Specific Gravity, Urine: 1.017 (ref 1.005–1.030)
pH: 7 (ref 5.0–8.0)

## 2023-12-26 LAB — CBC WITH DIFFERENTIAL/PLATELET
Abs Immature Granulocytes: 0.03 10*3/uL (ref 0.00–0.07)
Basophils Absolute: 0 10*3/uL (ref 0.0–0.1)
Basophils Relative: 0 %
Eosinophils Absolute: 0.1 10*3/uL (ref 0.0–0.5)
Eosinophils Relative: 1 %
HCT: 45.8 % (ref 39.0–52.0)
Hemoglobin: 14.4 g/dL (ref 13.0–17.0)
Immature Granulocytes: 0 %
Lymphocytes Relative: 13 %
Lymphs Abs: 1.4 10*3/uL (ref 0.7–4.0)
MCH: 28.8 pg (ref 26.0–34.0)
MCHC: 31.4 g/dL (ref 30.0–36.0)
MCV: 91.6 fL (ref 80.0–100.0)
Monocytes Absolute: 0.7 10*3/uL (ref 0.1–1.0)
Monocytes Relative: 7 %
Neutro Abs: 8.4 10*3/uL — ABNORMAL HIGH (ref 1.7–7.7)
Neutrophils Relative %: 79 %
Platelets: 266 10*3/uL (ref 150–400)
RBC: 5 MIL/uL (ref 4.22–5.81)
RDW: 14.8 % (ref 11.5–15.5)
WBC: 10.7 10*3/uL — ABNORMAL HIGH (ref 4.0–10.5)
nRBC: 0 % (ref 0.0–0.2)

## 2023-12-26 LAB — BASIC METABOLIC PANEL
Anion gap: 9 (ref 5–15)
BUN: 8 mg/dL (ref 8–23)
CO2: 28 mmol/L (ref 22–32)
Calcium: 9.3 mg/dL (ref 8.9–10.3)
Chloride: 104 mmol/L (ref 98–111)
Creatinine, Ser: 0.9 mg/dL (ref 0.61–1.24)
GFR, Estimated: >60 mL/min (ref >60)
Glucose, Bld: 129 mg/dL — ABNORMAL HIGH (ref 70–99)
Potassium: 4.1 mmol/L (ref 3.5–5.1)
Sodium: 141 mmol/L (ref 135–145)

## 2023-12-26 LAB — TROPONIN I (HIGH SENSITIVITY)
Troponin I (High Sensitivity): 14 ng/L (ref <18)
Troponin I (High Sensitivity): 12 ng/L (ref <18)

## 2023-12-26 MED ORDER — OXYCODONE-ACETAMINOPHEN 5-325 MG PO TABS
2.0000 | ORAL_TABLET | Freq: Once | ORAL | Status: AC
  Administered 2023-12-26: 2 via ORAL
  Filled 2023-12-26: qty 2

## 2023-12-26 MED ORDER — LIDOCAINE 5 % EX PTCH
1.0000 | MEDICATED_PATCH | CUTANEOUS | Status: DC
  Administered 2023-12-26: 1 via TRANSDERMAL
  Filled 2023-12-26: qty 1

## 2023-12-26 MED ORDER — ACETAMINOPHEN 500 MG PO TABS
1000.0000 mg | ORAL_TABLET | Freq: Once | ORAL | Status: AC
  Administered 2023-12-26: 1000 mg via ORAL
  Filled 2023-12-26: qty 2

## 2023-12-26 NOTE — Discharge Instructions (Addendum)
 You were seen in the emergency department for chest pain after a fall.  Test performed while you are here include EKG, chest x-ray, CT scans, and blood work.  These test showed no emergent or life-threatening causes of your symptoms.  Please contact your cardiologist tomorrow to arrange close follow-up for further workup of your chest pain. You can take Tylenol  as needed for pain as well as continuing your home oxycodone  prescription.  You can also get lidocaine  patches over-the-counter at any grocery store or pharmacy. We have marked the area of bruising on your leg with a skin marker and are placing a compressive Ace bandage around it to help minimize additional spread.  If you experience significantly increased chest pain and shortness of breath, loss of consciousness, new fevers or chills, significantly increased pain and swelling in your leg, or any other concerns, return to the ED for reevaluation.

## 2023-12-26 NOTE — ED Provider Triage Note (Signed)
 Emergency Medicine Provider Triage Evaluation Note  Lawrence [name] , a 68 y.o. male  was evaluated in triage.  Pt complains of CP/fall on thinners last night.  Review of Systems  Positive: L thigh/hip pain, CP (for days) Negative: Cough, congestion, fever, HA, SHOB, deformity, neck pain  Physical Exam  BP (!) 137/104 (BP Location: Right Arm)   Pulse 100   Temp 97.6 F (36.4 C)   Resp 16   SpO2 97%  Gen:   Awake, no distress   Resp:  Normal effort  MSK:   Moves extremities without difficulty  Other:  No TTP back, TTP to L posterior thigh/hip  Medical Decision Making  Medically screening exam initiated at 2:37 PM.  Appropriate orders placed.  Lawrence [name] was informed that the remainder of the evaluation will be completed by another provider, this initial triage assessment does not replace that evaluation, and the importance of remaining in the ED until their evaluation is complete.  Labs and imaging ordered   Lawrence [name], [place] 12/26/23 [id number]

## 2023-12-26 NOTE — Patient Outreach (Addendum)
  Care Coordination   Follow Up Visit Note   12/26/2023 Name: Lawrence [name] MRN: [id number] DOB: 1956-02-22  ARYAN SPARKS is a 68 y.o. year old male who sees [name], Nancyann BRAVO, MD for primary care. I spoke with  TRACKER MANCE grandson by phone on 12/24/23.  What matters to the patients health and wellness today?  Mental health follow up and support    Goals Addressed             This Visit's Progress    Care Coordination Activities       Activities and task to complete in order to accomplish goals.   Keep all upcoming appointment discussed today-please ensure that you are coordinating with medicaid for transportation to all follow up appointments Self Support options  (continue follow up with [facility], to address your mental health needs             SDOH assessments and interventions completed:  No     Care Coordination Interventions:  Yes, provided  Interventions Today    Flowsheet Row Most Recent Value  Chronic Disease   Chronic disease during today's visit Chronic Obstructive Pulmonary Disease (COPD), Congestive Heart Failure (CHF), Atrial Fibrillation (AFib)  General Interventions   General Interventions Discussed/Reviewed General Interventions Reviewed, [employer]  [patient's grandson confirmed that patient is currently in the ED in [place] due to complaints of pain]  Mental Health Interventions   Mental Health Discussed/Reviewed Mental Health Reviewed  [confirmed that patient completed initial appointment with [facility] on 12/21/23 2:15pm for medication management-reinforced the importance of consistent mental health follow up]       Follow up plan: Follow up call scheduled for 01/07/24    Encounter Outcome:  Patient Visit Completed

## 2023-12-26 NOTE — ED Provider Notes (Signed)
 [place] EMERGENC[facility] DEPARTMENT AT [facility] Provider Note  MDM   HPI/ROS:  Lawrence [name] is a 68 y.o. male with pertinent past medical history of paroxysmal atrial fibrillation, coronary artery disease s/p CABG X2 with LIMA to the LAD and SVG to OM (2002); procedure was complicated by coronary artery dissection, asthma, osteoarthritis, hyperlipidemia, GERD, anxiety, DDD, diverticulosis, COPD, CKD essential hypertension and PSVT, type II diabetes mellitus, OSA & chronic heart failure who presents for evaluation of chest pain and fall.  Patient reports a chronic history of chest pain since 2002 that has been significantly worse today since he experienced a mechanical ground-level fall overnight when he tripped over his bedside commode.  He is not sure if he hit his head but does think he lost consciousness.  After the fall he is noted a large bruise over his left posterior thigh but denies any other injuries.  He has been ambulatory since the fall.  He states his pain is overlying his mid sternum CABG scar radiating into his left chest which is the same location as his chronic pain, however currently is much more severe.  He takes oxycodone  chronically for back pain but has not noted any improvement in his chest pain with this medication.  He denies any new dyspnea, leg swelling, abdominal pain, nausea, vomiting.   Physical exam is notable for: - Ecchymosis over left posterior and medial thigh with soft compartments -- Mild lower lumbar midline tenderness to palpation which is baseline -- Tenderness to palpation over mid sternum and left chest wall  On my initial evaluation, patient is:  -Vital signs stable. Patient afebrile, mildly hypertensive but otherwise hemodynamically stable, and non-toxic appearing. -Additional history obtained from review of prior records -EKG reviewed: A fib rate 95, normal axis, otherwise normal intervals, no significant ST-T wave changes   This patient's  current presentation, including their history and physical exam, is most consistent with atypical chest pain.  Patient with numerous ED visits as well as cardiology visits for the same and with overall nonfocal workups.  Per most recent cardiology note from 07/2023, patient's cardiothoracic surgeon believes likely one of his bypass grafts has occluded but is unlikely to be amenable to intervention.  He does have known depressed EF of 30-35% as of 07/2023, however appears grossly euvolemic on my assessment today.  Patient additionally with no wheezing, dyspnea, or other symptoms that would concern for COPD exacerbation.  Labs reviewed mild anemia but normal creatinine and electrolytes, normal troponin x 2.  EKG demonstrating known atrial fibrillation but nonischemic as above.  Low suspicion for ACS, cardiac contusion, myocarditis, pericarditis, PE based on history, exam, and workup.   Regarding patient's fall, CT head was obtained and demonstrated no skull fracture or intracranial hemorrhage.  He did not obtain C-spine imaging, however do not feel this is indicated given prolonged time since initial fall and patient with no midline tenderness as well as full range of motion with baseline strength (mildly weaker in right hemibody at baseline).  Left hip x-ray was additionally obtained and without evidence of fracture.  Does have evidence of soft tissue contusion but with soft compartments, low suspicion for active bleeding.  Will mark area of ecchymosis with marking pen and place compressive dressing over leg.  Patient was given dose of home oxycodone  as well as lidocaine  patch and Tylenol  for his chest pain.  He was subsequently felt appropriate for discharge.  He was advised to follow-up with his PCP for reevaluation.  His  next cardiology appointment is in 01/2024; patient was advised to contact his PCP regarding potential earlier appointment for reevaluation of his chest pain.  Disposition:  I discussed the  plan for discharge with the patient and/or their surrogate at bedside prior to discharge and they were in agreement with the plan and verbalized understanding of the return precautions provided. All questions answered to the best of my ability. Ultimately, the patient was discharged in stable condition with stable vital signs. I am reassured that they are capable of close follow up and good social support at home.   Clinical Impression: No diagnosis found.  Rx / DC Orders ED Discharge Orders     None       The plan for this patient was discussed with Dr. Dasie, who voiced agreement and who oversaw evaluation and treatment of this patient.   Clinical Complexity A medically appropriate history, review of systems, and physical exam was performed.  My independent interpretations of EKG, labs, and radiology are documented in the ED course above.   If decision rules were used in this patient's evaluation, they are listed below.    Patient's presentation is most consistent with acute presentation with potential threat to life or bodily function.  Medical Decision Making Risk OTC drugs. Prescription drug management.    HPI/ROS      See MDM section for pertinent HPI and ROS. A complete ROS was performed with pertinent positives/negatives noted above.   Past Medical History:  Diagnosis Date   A-fib (HCC)    Anemia    Anginal pain (HCC)    Anxiety    Arthritis    Asthma    CAD (coronary artery disease)    a. 2002 CABGx2 (LIMA->LAD, VG->VG->OM1);  b. 09/2012 DES->OM;  c. 03/2015 PTCA of LAD [facility]) in setting of atretic LIMA; d. 05/2015 Cath [facility]): nonobs dzs; e. 06/2015 Cath ([facility]): LM nl, LAD 45p/d ISR, 50d, D1/2 small, LCX 50p/d ISR, OM1 70ost, 30 ISR, VG->OM1 50ost, 55m, LIMA->LAD 99p/d - atretic, RCA dom, nl; f.cath 10/16: 40-50%(FFR 0.90) pLAD, 75% (FFR 0.77) mLAD s/p PCI/DES, oRCA 40% (FFR0.95)   Cancer (HCC)    SKIN CANCER ON BACK   Celiac disease    Chronic diastolic CHF  (congestive heart failure) (HCC)    a. 06/2009 Echo: EF 60-65%, Gr 1 DD, triv AI, mildly dil LA, nl RV.   COPD (chronic obstructive pulmonary disease) (HCC)    a. Chronic bronchitis and emphysema.   DDD (degenerative disc disease), lumbar    Diverticulosis    Dysrhythmia    Essential hypertension    GERD (gastroesophageal reflux disease)    History of hiatal hernia    History of kidney stones    H/O   History of tobacco abuse    a. Quit 2014.   Myocardial infarction [facility]) 2002   4 STENTS   Pancreatitis    PSVT (paroxysmal supraventricular tachycardia) (HCC)    a. 10/2012 Noted on Zio Patch.   Sleep apnea    LOST CORD TO CPAP -ONLY 02 @ BEDTIME   Tubular adenoma of colon    Type II diabetes mellitus (HCC)     Past Surgical History:  Procedure Laterality Date   BYPASS GRAFT     CARDIAC CATHETERIZATION N/A 07/12/2015   rocedure: Left Heart Cath and Cors/Grafts Angiography;  Surgeon: Victory LELON Sharps, MD;  Location: [facility] INVASIVE CV LAB;  Service: Cardiovascular;  Laterality: N/A;   CARDIAC CATHETERIZATION Right 10/07/2015   Procedure: Left Heart  Cath and Cors/Grafts Angiography;  Surgeon: Denyse DELENA Bathe, MD;  Location: [facility] INVASIVE CV LAB;  Service: Cardiovascular;  Laterality: Right;   CARDIAC CATHETERIZATION N/A 04/06/2016   Procedure: Left Heart Cath and Coronary Angiography;  Surgeon: Cara JONETTA Lovelace, MD;  Location: [facility] INVASIVE CV LAB;  Service: Cardiovascular;  Laterality: N/A;   CARDIAC CATHETERIZATION  04/06/2016   Procedure: Bypass Graft Angiography;  Surgeon: Cara JONETTA Lovelace, MD;  Location: [facility] INVASIVE CV LAB;  Service: Cardiovascular;;   CARDIAC CATHETERIZATION N/A 11/02/2016   Procedure: Left Heart Cath and Cors/Grafts Angiography and possible PCI;  Surgeon: Cara JONETTA Lovelace, MD;  Location: [facility] INVASIVE CV LAB;  Service: Cardiovascular;  Laterality: N/A;   CARDIAC CATHETERIZATION N/A 11/02/2016   Procedure: Coronary Stent Intervention;  Surgeon: Cara JONETTA Lovelace, MD;   Location: [facility] INVASIVE CV LAB;  Service: Cardiovascular;  Laterality: N/A;   CARDIOVERSION N/A 06/29/2022   Procedure: CARDIOVERSION;  Surgeon: Hester Wolm PARAS, MD;  Location: [facility] ORS;  Service: Cardiovascular;  Laterality: N/A;   CHOLECYSTECTOMY     CIRCUMCISION N/A 06/09/2019   Procedure: CIRCUMCISION ADULT;  Surgeon: Francisca Redell BROCKS, MD;  Location: [facility] ORS;  Service: Urology;  Laterality: N/A;   COLONOSCOPY WITH PROPOFOL  N/A 04/01/2018   Procedure: COLONOSCOPY WITH PROPOFOL ;  Surgeon: Viktoria Lamar DASEN, MD;  Location: [facility] ENDOSCOPY;  Service: Endoscopy;  Laterality: N/A;   COLONOSCOPY WITH PROPOFOL  N/A 05/01/2023   Procedure: COLONOSCOPY WITH PROPOFOL ;  Surgeon: Unk Corinn Skiff, MD;  Location: [facility] ENDOSCOPY;  Service: Gastroenterology;  Laterality: N/A;   ESOPHAGEAL DILATION     ESOPHAGOGASTRODUODENOSCOPY (EGD) WITH PROPOFOL  N/A 04/01/2018   Procedure: ESOPHAGOGASTRODUODENOSCOPY (EGD) WITH PROPOFOL ;  Surgeon: Viktoria Lamar DASEN, MD;  Location: [facility] ENDOSCOPY;  Service: Endoscopy;  Laterality: N/A;   ESOPHAGOGASTRODUODENOSCOPY (EGD) WITH PROPOFOL  N/A 05/01/2023   Procedure: ESOPHAGOGASTRODUODENOSCOPY (EGD) WITH PROPOFOL ;  Surgeon: Unk Corinn Skiff, MD;  Location: [facility] ENDOSCOPY;  Service: Gastroenterology;  Laterality: N/A;   GIVENS CAPSULE STUDY  05/01/2023   Procedure: GIVENS CAPSULE STUDY;  Surgeon: Unk Corinn Skiff, MD;  Location: [facility] ENDOSCOPY;  Service: Gastroenterology;;   LEFT HEART CATH AND CORS/GRAFTS ANGIOGRAPHY N/A 06/12/2019   Procedure: LEFT HEART CATH AND CORS/GRAFTS ANGIOGRAPHY;  Surgeon: Bosie Vinie DELENA, MD;  Location: [facility] INVASIVE CV LAB;  Service: Cardiovascular;  Laterality: N/A;   LEFT HEART CATH AND CORS/GRAFTS ANGIOGRAPHY N/A 03/11/2020   Procedure: LEFT HEART CATH AND CORS/GRAFTS ANGIOGRAPHY;  Surgeon: Ammon Blunt, MD;  Location: [facility] INVASIVE CV LAB;  Service: Cardiovascular;  Laterality: N/A;   LEFT HEART CATH AND CORS/GRAFTS ANGIOGRAPHY N/A  05/01/2021   Procedure: LEFT HEART CATH AND CORS/GRAFTS ANGIOGRAPHY;  Surgeon: Hester Wolm PARAS, MD;  Location: [facility] INVASIVE CV LAB;  Service: Cardiovascular;  Laterality: N/A;   LEFT HEART CATH AND CORS/GRAFTS ANGIOGRAPHY N/A 11/02/2022   Procedure: LEFT HEART CATH AND CORS/GRAFTS ANGIOGRAPHY;  Surgeon: Lovelace Cara JONETTA, MD;  Location: [facility] INVASIVE CV LAB;  Service: Cardiovascular;  Laterality: N/A;   TONSILLECTOMY     VASCULAR SURGERY        Physical Exam   Vitals:   12/26/23 1431  BP: (!) 137/104  Pulse: 100  Resp: 16  Temp: 97.6 F (36.4 C)  SpO2: 97%    Physical Exam Gen: NAD. Appears comfortable HENT: Conjunctiva clear, PERRL, EOMI. MMM.  CV: RRR. No M/R/G Pulm: Barrel chest.  Prolonged expiratory phase.  Lungs CTAB with no wheezing, rales, rhonchi.  Tenderness to palpation over the midline and left chest wall.  Well-healed midline surgical scar.  Neck: No midline cervical spine tenderness palpation.  Full range of the neck. GI: Abdomen soft, non-tender, non-distended.  MSK/Skin: No lower extremity edema. Extremities warm, well-perfused with 2+ pulses in all 4 extremities.  Ecchymosis present over right posterior and medial thigh.  Compartments soft.  No bony tenderness.  Pelvis is stable to lateral compression. Neuro: A&Ox3. GCS 15. Moves all extremities.  4+/5 strength in right hemibody (baseline).  5/5 strength in left hemibody.  Decreased sensation to light touch distal to mid calves (baseline)    Procedures   If procedures were preformed on this patient, they are listed below:  Procedures   Laverda Rimes, MD Emergency Medicine PGY-2   Please note that this documentation was produced with the assistance of voice-to-text technology and may contain errors.    Rimes Laverda, MD 12/27/23 EULAS    Dasie Faden, MD 12/27/23 [phone]

## 2023-12-26 NOTE — ED Provider Notes (Signed)
 I saw and evaluated the patient, reviewed the resident's note and I agree with the findings and plan.  EKG Interpretation Date/Time:  Sunday December 26 2023 14:52:37 EST Ventricular Rate:  95 PR Interval:    QRS Duration:  92 QT Interval:  364 QTC Calculation: 457 R Axis:   48  Text Interpretation: Atrial fibrillation Cannot rule out Inferior infarct , age undetermined Abnormal ECG When compared with ECG of 20-Dec-2023 04:06, PREVIOUS ECG IS PRESENT No significant change since last tracing Confirmed by Dasie Faden ([id number]) on 12/26/2023 7:36:59 PM   Patient's EKG shows A-fib which is unchanged from prior.  Patient here after mechanical fall few days ago.  His ACS workup as well as chest x-ray without acute findings.  Will discharge home   Dasie Faden, MD 12/26/23 [id number]

## 2023-12-26 NOTE — ED Triage Notes (Signed)
 Pt arrived POV from home c/o falling over his BSC last night and now he has CP. Pt states he has had CP since 2002 but last night after the fall it got worse. Pt denies any pain when takes a deep breath.

## 2023-12-26 NOTE — Patient Instructions (Signed)
 Visit Information  Thank you for taking time to visit with me today. Please don't hesitate to contact me if I can be of assistance to you.   Following are the goals we discussed today:   Goals Addressed             This Visit's Progress    Care Coordination Activities       Activities and task to complete in order to accomplish goals.   Keep all upcoming appointment discussed today-please ensure that you are coordinating with medicaid for transportation to all follow up appointments Self Support options  (continue follow up with [facility], to address your mental health needs             Our next appointment is by telephone on 01/07/24 at 1pm  Please call the care guide team at [phone] if you need to cancel or reschedule your appointment.   If you are experiencing a Mental Health or Behavioral Health Crisis or need someone to talk to, please call [phone]   Patient verbalizes understanding of instructions and care plan provided today and agrees to view in MyChart. Active MyChart status and patient understanding of how to access instructions and care plan via MyChart confirmed with patient.     Telephone follow up appointment with care management team member scheduled for: 01/07/24  Lenn Mean, LCSW [place]  [facility], [facility] Licensed Clinical Social Worker Care Coordinator  Direct Dial: [phone]

## 2023-12-27 ENCOUNTER — Telehealth: Payer: Self-pay

## 2023-12-27 NOTE — Transitions of Care (Post Inpatient/ED Visit) (Signed)
 12/27/2023  Name: Lawrence [name] MRN: [id number] DOB: [birth year]  Today's TOC FU Call Status: Today's TOC FU Call Status:: Successful TOC FU Call Completed TOC FU Call Complete Date: 12/27/23 Patient's Name and Date of Birth confirmed.  Transition Care Management Follow-up Telephone Call Date of Discharge: 12/24/23 Discharge Facility: Other [facility]) Name of Other (Non-[facility]) Discharge Facility: [facility] Reason for ED Visit: Other: (chest pain) How have you been since you were released from the hospital?: Better Any questions or concerns?: No  Items Reviewed: Did you receive and understand the discharge instructions provided?: Yes Medications obtained,verified, and reconciled?: Yes (Medications Reviewed) Any new allergies since your discharge?: No Dietary orders reviewed?: Yes Do you have support at home?: Yes People in Home: grandchild(ren)  Medications Reviewed Today: Medications Reviewed Today     Reviewed by Darrall Ellison, LPN (Licensed Practical Nurse) on 12/27/23 at 1304  Med List Status: <None>   Medication Order Taking? Sig Documenting Provider Last Dose Status Informant  Accu-Chek Softclix Lancets lancets [id number] No Use as instructed to check sugar daily for type 2 diabetes. Lamon Pillow, MD Taking Active Pharmacy Records, Other  allopurinol  (ZYLOPRIM ) 300 MG tablet [id number] No Take 300 mg by mouth 2 (two) times daily. [provider] Taking Active   ALPRAZolam  (XANAX ) 1 MG tablet [id number] No Take 1 tablet (1 mg total) by mouth 3 (three) times daily as needed. Lamon Pillow, MD Taking Active Pharmacy Records, Other  amLODipine  (NORVASC ) 10 MG tablet [id number] No Take 10 mg by mouth daily. [provider] Taking Active Self  apixaban  (ELIQUIS ) 5 MG TABS tablet [id number] No Take 1 tablet (5 mg total) by mouth 2 (two) times daily. Lamon Pillow, MD Taking Active Pharmacy Records, Other  atorvastatin  (LIPITOR ) 80 MG tablet  [id number] No TAKE 1 TABLET BY MOUTH AT BEDTIME Lamon Pillow, MD Taking Active Pharmacy Records, Other  Blood Glucose Calibration (ACCU-CHEK GUIDE CONTROL) LIQD [id number]  Use with blood glucose monitor as directed Lamon Pillow, MD  Active   Blood Glucose Monitoring Suppl (ACCU-CHEK GUIDE) w/Device KIT [id number]  Use to check blood sugars as directed Lamon Pillow, MD  Active   Budeson-Glycopyrrol-Formoterol  (BREZTRI  AEROSPHERE) 160-9-4.8 MCG/ACT AERO [id number] No Inhale 2 puffs into the lungs 2 (two) times daily. Lamon Pillow, MD Taking Active Pharmacy Records, Other  cephALEXin  (KEFLEX ) 500 MG capsule [id number]  Take 1 capsule (500 mg total) by mouth 2 (two) times daily for 7 days. Lubertha Rush, MD  Active   cetirizine  (ZYRTEC ) 10 MG tablet [id number] No TAKE 1 TABLET BY MOUTH AT BEDTIME Lamon Pillow, MD Taking Active Pharmacy Records, Other           Med Note (BEJOS, ANJA J   Mon Oct 18, 2023 10:05 PM) Last filled 4D supply in June  Cyanocobalamin  (VITAMIN B-12) 1000 MCG SUBL [id number] No Place 1 tablet under the tongue daily. [provider] Unknown Unknown Active Pharmacy Records, Other  FARXIGA  10 MG TABS tablet [id number] No Take 10 mg by mouth daily. [provider] Taking Active Pharmacy Records, Other           Med Note Nickola Baron Oct 18, 2023 10:09 PM) Last filled 08/16/23 for 30 days.   glucose blood (ACCU-CHEK GUIDE) test strip [id number] No Use as instructed to check sugar daily for type 2 diabetes. Lamon Pillow, MD Taking Active Pharmacy Records, Other  hydrOXYzine  (VISTARIL ) 25 MG capsule [id number]  Take 1 capsule (25 mg total) by mouth 3 (three) times daily as needed. Lubertha Rush, MD  Active   iron  polysaccharides (FERREX 150) 150 MG capsule [id number] No TAKE 1 CAPSULE BY MOUTH DAILY [name], Erlinda Haws, MD Unknown Unknown Active Pharmacy Records, Other  isosorbide  mononitrate (IMDUR ) 60 MG 24 hr tablet [id number]  TAKE 1 TABLET BY MOUTH  DAILY Lamon Pillow, MD  Active   lidocaine  (LIDODERM ) 5 % [id number] No Place 1 patch onto the skin daily. Remove & Discard patch within 12 hours or as directed by MD Luna Salinas, MD Unknown Unknown Active Pharmacy Records, Other  metFORMIN  (GLUCOPHAGE -XR) 500 MG 24 hr tablet [id number] No Take 1 tablet (500 mg total) by mouth 2 (two) times daily. Normie Becton, FNP Taking Active Pharmacy Records, Other           Med Note [facility], Jonnie Nettles   Fri Oct 01, 2023  4:16 PM) Reports taking one a day  methocarbamol  (ROBAXIN ) 500 MG tablet [id number] No TAKE 1 TABLET BY MOUTH EVERY 8 HOURS AS NEEDED FOR MUSCLE SPASMS Lamon Pillow, MD Taking Active Pharmacy Records, Other           Med Note Laverna Pott, Sue Em   Thu Aug 26, 2023  1:48 PM) Up to 4x a day  nitroGLYCERIN  (NITROSTAT ) 0.4 MG SL tablet [id number] No Place 1 tablet (0.4 mg total) under the tongue every 5 (five) minutes as needed for chest pain (Do not take if blood pressure is low, systolic BP<110). Sheril Dines, MD Taking Active   omega-3 acid ethyl esters (LOVAZA ) 1 g capsule [id number] No TAKE 4 CAPSULES (4 GRAMS TOTAL) BY Suzzanna Estimable, MD Taking Active Pharmacy Records, Other  oxyCODONE -acetaminophen  (PERCOCET) 10-325 MG tablet [id number]  TAKE 1 TABLET BY MOUTH EVERY 4 HOURS AS NEEDED FOR PAIN. Lamon Pillow, MD  Active   OXYGEN  [id number] No Inhale 2 L into the lungs at bedtime as needed (for shortness of breath). [provider] Taking Active Pharmacy Records, Other  pantoprazole  (PROTONIX ) 40 MG tablet [id number] No Take 1 tablet (40 mg total) by mouth 2 (two) times daily for 14 days. Brigitte Canard, PA-C Past Week Expired 08/07/23 2359 Self, Pharmacy Records           Med Note Nickola Baron Oct 18, 2023 10:01 PM) Last filled in July for 14 days  ranolazine  (RANEXA ) 1000 MG SR tablet [id number] No Take 1 tablet (1,000 mg total) by mouth 2 (two) times daily. Aisha Hove, MD Taking Active Pharmacy  Records, Other  sacubitril -valsartan  (ENTRESTO ) 24-26 MG [id number] No Take 1 tablet by mouth 2 (two) times daily. [provider] Taking Active Self  sennosides-docusate sodium  (SENOKOT-S) 8.6-50 MG tablet [id number] No Take 1 tablet by mouth daily. [provider] Taking Active Self  spironolactone  (ALDACTONE ) 25 MG tablet [id number] No Take 25 mg by mouth daily. [provider] Taking Active Self  sucralfate  (CARAFATE ) 1 GM/10ML suspension [id number]  Take 10 mLs (1 g total) by mouth 4 (four) times daily. Buell Carmin, MD  Active   traZODone  (DESYREL ) 100 MG tablet [id number]  Take 1 tablet (100 mg total) by mouth at bedtime as needed for sleep. Lamon Pillow, MD  Active   venlafaxine  XR (EFFEXOR -XR) 75 MG 24 hr capsule [id number] No Take 75 mg by mouth daily with breakfast. [provider] Taking Active Pharmacy Records, Other  Home Care and Equipment/Supplies: Were Home Health Services Ordered?: NA Any new equipment or medical supplies ordered?: NA  Functional Questionnaire: Do you need assistance with bathing/showering or dressing?: No Do you need assistance with meal preparation?: No Do you need assistance with eating?: No Do you have difficulty maintaining continence: No Do you need assistance with getting out of bed/getting out of a chair/moving?: No Do you have difficulty managing or taking your medications?: No  Follow up appointments reviewed: PCP Follow-up appointment confirmed?: Yes Date of PCP follow-up appointment?: 12/29/23 Follow-up Provider: [facility] Follow-up appointment confirmed?: NA Do you need transportation to your follow-up appointment?: No Do you understand care options if your condition(s) worsen?: Yes-patient verbalized understanding    SIGNATURE Darrall Ellison, LPN [facility] Nurse Health Advisor Direct Dial [phone]

## 2023-12-28 DIAGNOSIS — F321 Major depressive disorder, single episode, moderate: Secondary | ICD-10-CM | POA: Diagnosis not present

## 2023-12-29 ENCOUNTER — Other Ambulatory Visit: Payer: Self-pay

## 2023-12-29 ENCOUNTER — Telehealth: Payer: Self-pay

## 2023-12-29 ENCOUNTER — Inpatient Hospital Stay: Payer: Medicare HMO | Admitting: Family Medicine

## 2023-12-29 NOTE — Patient Outreach (Signed)
  Care Management   Outreach Note  12/29/2023 Name: CARLYLE MCELRATH MRN: [id number] DOB: 06/02/1956  An unsuccessful outreach attempt was made today for a scheduled Care Management visit.   Follow Up Plan:  A HIPAA compliant phone message was left for the patient providing contact information and requesting a return call.     Juanell Fairly [facility] Population Health RN Care Manager Direct Dial: [phone]  Fax: [phone] Website: [email or web address]

## 2023-12-31 ENCOUNTER — Encounter: Payer: Self-pay | Admitting: Family Medicine

## 2023-12-31 ENCOUNTER — Ambulatory Visit: Payer: Medicare HMO | Admitting: Family Medicine

## 2023-12-31 VITALS — BP 129/86 | HR 79 | Temp 97.9°F | Resp 16 | Wt 186.0 lb

## 2023-12-31 DIAGNOSIS — S8010XA Contusion of unspecified lower leg, initial encounter: Secondary | ICD-10-CM

## 2023-12-31 DIAGNOSIS — R0789 Other chest pain: Secondary | ICD-10-CM

## 2023-12-31 DIAGNOSIS — I10 Essential (primary) hypertension: Secondary | ICD-10-CM | POA: Diagnosis not present

## 2023-12-31 DIAGNOSIS — B359 Dermatophytosis, unspecified: Secondary | ICD-10-CM | POA: Diagnosis not present

## 2023-12-31 DIAGNOSIS — I25112 Atherosclerotic heart disease of native coronary artery with refractory angina pectoris: Secondary | ICD-10-CM | POA: Diagnosis not present

## 2023-12-31 DIAGNOSIS — S8000XA Contusion of unspecified knee, initial encounter: Secondary | ICD-10-CM

## 2023-12-31 MED ORDER — GABAPENTIN 300 MG PO CAPS: ORAL_CAPSULE | ORAL | 2 refills | Status: DC

## 2023-12-31 NOTE — Patient Instructions (Signed)
Please review the attached list of medications and notify my office if there are any errors.   Start taking gabapentin to help with pain control. Start by taking 1 every day at bedtime for the next week, then increase to 1 pill twice a day

## 2024-01-04 ENCOUNTER — Other Ambulatory Visit: Payer: Self-pay | Admitting: Family Medicine

## 2024-01-04 DIAGNOSIS — F321 Major depressive disorder, single episode, moderate: Secondary | ICD-10-CM | POA: Diagnosis not present

## 2024-01-07 ENCOUNTER — Ambulatory Visit: Payer: Self-pay | Admitting: *Deleted

## 2024-01-07 NOTE — Progress Notes (Signed)
Established patient visit   Patient: Lawrence [name]   DOB: Sep 22, 1956   68 y.o. Male  MRN: [id number] Visit Date: 12/31/2023  Today's healthcare provider: [name], MD   Chief Complaint  Patient presents with   Hospitalization Follow-up   Subjective    Discussed the use of AI scribe software for clinical note transcription with the patient, who gave verbal consent to proceed.  History of Present Illness   Lawrence [name] is a 68 year old male with atrial fibrillation and a history of double bypass surgery who presents with chest pain and recent falls. He has several ER visits since his last appt here, the last being for a fall on 12-24-2023.   He has been experiencing chest pain that radiates from his chest upwards and sometimes extends to his elbow, with consistent numbness in his fingers. He has a history of double bypass surgery in 2002, with one bypass reportedly not functioning. He also has atrial fibrillation, which he describes as causing his heart enzymes to fluctuate.  He has had two recent hospital visits, the most recent following a fall that resulted in a bruise on his left leg. He is on Eliquis, a blood thinner, and uses a walker to assist with mobility. The bruise was marked and bandaged at the emergency room, and it is healing. He mentions experiencing swelling in his left leg, which he manages by elevating it on a pillow.  He reports a skin infection on his face, for which he was initially prescribed an antibiotic he was allergic to, causing redness. He was switched to another antibiotic and has completed the course. He is using a 1% ketoconazole cream on his face and arms, which he received from [facility], and reports improvement.  He describes his breathing as fairly good, although he needs to rest after walking for a bit.       Medications: Outpatient Medications Prior to Visit  Medication Sig   Accu-Chek Softclix Lancets lancets Use as  instructed to check sugar daily for type 2 diabetes.   allopurinol (ZYLOPRIM) 300 MG tablet Take 300 mg by mouth 2 (two) times daily.   ALPRAZolam (XANAX) 1 MG tablet Take 1 tablet (1 mg total) by mouth 3 (three) times daily as needed.   amLODipine (NORVASC) 10 MG tablet Take 10 mg by mouth daily.   apixaban (ELIQUIS) 5 MG TABS tablet Take 1 tablet (5 mg total) by mouth 2 (two) times daily.   atorvastatin (LIPITOR) 80 MG tablet TAKE 1 TABLET BY MOUTH AT BEDTIME   Blood Glucose Calibration (ACCU-CHEK GUIDE CONTROL) LIQD Use with blood glucose monitor as directed   Blood Glucose Monitoring Suppl (ACCU-CHEK GUIDE) w/Device KIT Use to check blood sugars as directed   Budeson-Glycopyrrol-Formoterol (BREZTRI AEROSPHERE) 160-9-4.8 MCG/ACT AERO Inhale 2 puffs into the lungs 2 (two) times daily.   cetirizine (ZYRTEC) 10 MG tablet TAKE 1 TABLET BY MOUTH AT BEDTIME   Cyanocobalamin (VITAMIN B-12) 1000 MCG SUBL Place 1 tablet under the tongue daily.   FARXIGA 10 MG TABS tablet Take 10 mg by mouth daily.   glucose blood (ACCU-CHEK GUIDE) test strip Use as instructed to check sugar daily for type 2 diabetes.   hydrOXYzine (VISTARIL) 25 MG capsule Take 1 capsule (25 mg total) by mouth 3 (three) times daily as needed.   iron polysaccharides (FERREX 150) 150 MG capsule TAKE 1 CAPSULE BY MOUTH DAILY   isosorbide mononitrate (IMDUR) 60 MG 24 hr tablet  TAKE 1 TABLET BY MOUTH DAILY   lidocaine (LIDODERM) 5 % Place 1 patch onto the skin daily. Remove & Discard patch within 12 hours or as directed by MD   metFORMIN (GLUCOPHAGE-XR) 500 MG 24 hr tablet Take 1 tablet (500 mg total) by mouth 2 (two) times daily.   methocarbamol (ROBAXIN) 500 MG tablet TAKE 1 TABLET BY MOUTH EVERY 8 HOURS AS NEEDED FOR MUSCLE SPASMS   nitroGLYCERIN (NITROSTAT) 0.4 MG SL tablet Place 1 tablet (0.4 mg total) under the tongue every 5 (five) minutes as needed for chest pain (Do not take if blood pressure is low, systolic BP<110).   omega-3 acid  ethyl esters (LOVAZA) 1 g capsule TAKE 4 CAPSULES (4 GRAMS TOTAL) BY MOUTHDAILY   OXYGEN Inhale 2 L into the lungs at bedtime as needed (for shortness of breath).   ranolazine (RANEXA) 1000 MG SR tablet Take 1 tablet (1,000 mg total) by mouth 2 (two) times daily.   sacubitril-valsartan (ENTRESTO) 24-26 MG Take 1 tablet by mouth 2 (two) times daily.   sennosides-docusate sodium (SENOKOT-S) 8.6-50 MG tablet Take 1 tablet by mouth daily.   spironolactone (ALDACTONE) 25 MG tablet Take 25 mg by mouth daily.   sucralfate (CARAFATE) 1 GM/10ML suspension Take 10 mLs (1 g total) by mouth 4 (four) times daily.   traZODone (DESYREL) 100 MG tablet Take 1 tablet (100 mg total) by mouth at bedtime as needed for sleep.   venlafaxine XR (EFFEXOR-XR) 75 MG 24 hr capsule Take 75 mg by mouth daily with breakfast.   [DISCONTINUED] oxyCODONE-acetaminophen (PERCOCET) 10-325 MG tablet TAKE 1 TABLET BY MOUTH EVERY 4 HOURS AS NEEDED FOR PAIN.   pantoprazole (PROTONIX) 40 MG tablet Take 1 tablet (40 mg total) by mouth 2 (two) times daily for 14 days.   No facility-administered medications prior to visit.     Objective    BP 129/86 (BP Location: Left Arm, Patient Position: Sitting, Cuff Size: Normal)   Pulse 79   Temp 97.9 F (36.6 C) (Oral)   Resp 16   Wt 186 lb (84.4 kg)   SpO2 99%   BMI 29.13 kg/m    Physical Exam   SKIN: Bruise on left leg healing well. Skin infection on face healing well.    No results found for any visits on 12/31/23.  Assessment & Plan       Fall with Bruising Large bruise on left leg after a fall. Patient on Eliquis. Bruise is healing appropriately. -Continue Eliquis as prescribed.  Chest Pain Recurrent chest pain, radiating to left arm. Has had multiple ER visit for this and ruled out for cardia events.  Suspected nerve-related pain, possibly related to prior CABG -Start Gabapentin for suspected nerve pain. -Continue Oxycodone as needed.  Skin Infection Recent skin infection  treated with antibiotics. Patient had allergic reaction to Augmentin, but completed course of cephalexin. -Continue applying Ketoconazole cream as needed.  Follow-up appointment scheduled for January 24, 2024.         [name], MD  [facility] [phone] (phone) [phone] (fax)  [facility]

## 2024-01-08 NOTE — Patient Outreach (Addendum)
 Care Coordination   Follow Up Visit Note   01/08/2024 Name: Lawrence [name] MRN: [id number] DOB: Nov 07, 1956  Lawrence [name] is a 68 y.o. year old male who sees [name], Demetrios Isaacs, MD for primary care. I spoke with  Lawrence [name] by phone on 01/07/24.  What matters to the patients health and wellness today?  Mental health follow up and community resource support    Goals Addressed             This Visit's Progress    Care Coordination Activities       Activities and task to complete in order to accomplish goals.   Keep all upcoming appointment discussed today-please ensure that you are coordinating with medicaid for transportation to all follow up appointments Self Support options  (continue follow up with [facility], to address your mental health needs-next appointment 01/10/24             SDOH assessments and interventions completed:  No     Care Coordination Interventions:  Yes, provided  Interventions Today    Flowsheet Row Most Recent Value  Chronic Disease   Chronic disease during today's visit Chronic Obstructive Pulmonary Disease (COPD), Congestive Heart Failure (CHF), Atrial Fibrillation (AFib)  General Interventions   General Interventions Discussed/Reviewed General Interventions Reviewed, [facility], Doctor Visits  [Follow up regarding coordination of mental health follow up and any additional community resource needs]  Doctor Visits Discussed/Reviewed Doctor Visits Reviewed, PCP  [2/27 RNCM, 3/10 PCP 4/29 AWV]  Exercise Interventions   Exercise Discussed/Reviewed Exercise Discussed  [walks aroud complex daily-grandson stays with him for safety]  Mental Health Interventions   Mental Health Discussed/Reviewed Mental Health Discussed  [discussed plan to continue follow up with [facility]- Lawrence [name], manages medication, therapy starts 01/10/24-conitnues to work on coping strategies for anxiety-ongoing MH follow up encouraged]  Safety Interventions    Safety Discussed/Reviewed Safety Discussed  [encouraged to use of rollator walker while ambulating to avoid falls]       Follow up plan: Follow up call scheduled for 01/28/24    Encounter Outcome:  Patient Visit Completed

## 2024-01-08 NOTE — Patient Instructions (Signed)
 Visit Information  Thank you for taking time to visit with me today. Please don't hesitate to contact me if I can be of assistance to you.   Following are the goals we discussed today:   Goals Addressed             This Visit's Progress    Care Coordination Activities       Activities and task to complete in order to accomplish goals.   Keep all upcoming appointment discussed today-please ensure that you are coordinating with medicaid for transportation to all follow up appointments Self Support options  (continue follow up with [facility], to address your mental health needs-next appointment 01/10/24             Our next appointment is by telephone on 01/28/24 at 11am  Please call the care guide team at [phone] if you need to cancel or reschedule your appointment.   If you are experiencing a Mental Health or Behavioral Health Crisis or need someone to talk to, please call 911   Patient verbalizes understanding of instructions and care plan provided today and agrees to view in MyChart. Active MyChart status and patient understanding of how to access instructions and care plan via MyChart confirmed with patient.     Telephone follow up appointment with care management team member scheduled for: 01/28/24  Verna Czech, LCSW [place]  [facility], [facility] Licensed Clinical Social Worker Care Coordinator  Direct Dial: [phone]

## 2024-01-10 DIAGNOSIS — R079 Chest pain, unspecified: Secondary | ICD-10-CM | POA: Diagnosis not present

## 2024-01-10 DIAGNOSIS — F4381 Prolonged grief disorder: Secondary | ICD-10-CM | POA: Diagnosis not present

## 2024-01-10 DIAGNOSIS — I10 Essential (primary) hypertension: Secondary | ICD-10-CM | POA: Diagnosis not present

## 2024-01-10 DIAGNOSIS — F321 Major depressive disorder, single episode, moderate: Secondary | ICD-10-CM | POA: Diagnosis not present

## 2024-01-10 DIAGNOSIS — R0902 Hypoxemia: Secondary | ICD-10-CM | POA: Diagnosis not present

## 2024-01-11 ENCOUNTER — Emergency Department
Admission: EM | Admit: 2024-01-11 | Discharge: 2024-01-11 | Disposition: A | Discharge status: Home / Self Care | Payer: Medicare HMO | Attending: Emergency Medicine | Admitting: Emergency Medicine

## 2024-01-11 ENCOUNTER — Other Ambulatory Visit: Payer: Self-pay

## 2024-01-11 ENCOUNTER — Emergency Department: Payer: Medicare HMO

## 2024-01-11 DIAGNOSIS — R079 Chest pain, unspecified: Secondary | ICD-10-CM

## 2024-01-11 DIAGNOSIS — R072 Precordial pain: Secondary | ICD-10-CM | POA: Diagnosis present

## 2024-01-11 DIAGNOSIS — J441 Chronic obstructive pulmonary disease with (acute) exacerbation: Secondary | ICD-10-CM | POA: Diagnosis not present

## 2024-01-11 DIAGNOSIS — Z951 Presence of aortocoronary bypass graft: Secondary | ICD-10-CM | POA: Insufficient documentation

## 2024-01-11 DIAGNOSIS — I509 Heart failure, unspecified: Secondary | ICD-10-CM | POA: Diagnosis not present

## 2024-01-11 DIAGNOSIS — I700 Atherosclerosis of aorta: Secondary | ICD-10-CM | POA: Diagnosis not present

## 2024-01-11 DIAGNOSIS — F321 Major depressive disorder, single episode, moderate: Secondary | ICD-10-CM | POA: Diagnosis not present

## 2024-01-11 DIAGNOSIS — I2510 Atherosclerotic heart disease of native coronary artery without angina pectoris: Secondary | ICD-10-CM | POA: Diagnosis not present

## 2024-01-11 DIAGNOSIS — R0789 Other chest pain: Secondary | ICD-10-CM | POA: Diagnosis not present

## 2024-01-11 LAB — BLOOD GAS, VENOUS
Acid-Base Excess: 6.1 mmol/L — ABNORMAL HIGH (ref 0.0–2.0)
Bicarbonate: 31.8 mmol/L — ABNORMAL HIGH (ref 20.0–28.0)
O2 Saturation: 92.7 %
Patient temperature: 37
pCO2, Ven: 49 mm[Hg] (ref 44–60)
pH, Ven: 7.42 (ref 7.25–7.43)
pO2, Ven: 61 mm[Hg] — ABNORMAL HIGH (ref 32–45)

## 2024-01-11 LAB — COMPREHENSIVE METABOLIC PANEL
ALT: 15 U/L (ref 0–44)
AST: 16 U/L (ref 15–41)
Albumin: 3.6 g/dL (ref 3.5–5.0)
Alkaline Phosphatase: 67 U/L (ref 38–126)
Anion gap: 13 (ref 5–15)
BUN: 13 mg/dL (ref 8–23)
CO2: 26 mmol/L (ref 22–32)
Calcium: 9.6 mg/dL (ref 8.9–10.3)
Chloride: 98 mmol/L (ref 98–111)
Creatinine, Ser: 0.9 mg/dL (ref 0.61–1.24)
GFR, Estimated: >60 mL/min (ref >60)
Glucose, Bld: 135 mg/dL — ABNORMAL HIGH (ref 70–99)
Potassium: 3.6 mmol/L (ref 3.5–5.1)
Sodium: 137 mmol/L (ref 135–145)
Total Bilirubin: 1.4 mg/dL — ABNORMAL HIGH (ref 0.0–1.2)
Total Protein: 6.4 g/dL — ABNORMAL LOW (ref 6.5–8.1)

## 2024-01-11 LAB — CBC WITH DIFFERENTIAL/PLATELET
Abs Immature Granulocytes: 0.01 10*3/uL (ref 0.00–0.07)
Basophils Absolute: 0 10*3/uL (ref 0.0–0.1)
Basophils Relative: 0 %
Eosinophils Absolute: 0.1 10*3/uL (ref 0.0–0.5)
Eosinophils Relative: 2 %
HCT: 46.8 % (ref 39.0–52.0)
Hemoglobin: 15.1 g/dL (ref 13.0–17.0)
Immature Granulocytes: 0 %
Lymphocytes Relative: 23 %
Lymphs Abs: 1.2 10*3/uL (ref 0.7–4.0)
MCH: 28.9 pg (ref 26.0–34.0)
MCHC: 32.3 g/dL (ref 30.0–36.0)
MCV: 89.7 fL (ref 80.0–100.0)
Monocytes Absolute: 0.6 10*3/uL (ref 0.1–1.0)
Monocytes Relative: 11 %
Neutro Abs: 3.2 10*3/uL (ref 1.7–7.7)
Neutrophils Relative %: 64 %
Platelets: 158 10*3/uL (ref 150–400)
RBC: 5.22 MIL/uL (ref 4.22–5.81)
RDW: 14.1 % (ref 11.5–15.5)
WBC: 5.1 10*3/uL (ref 4.0–10.5)
nRBC: 0 % (ref 0.0–0.2)

## 2024-01-11 LAB — RESP PANEL BY RT-PCR (RSV, FLU A&B, COVID)  RVPGX2
Influenza A by PCR: NEGATIVE
Influenza B by PCR: NEGATIVE
Resp Syncytial Virus by PCR: NEGATIVE
SARS Coronavirus 2 by RT PCR: NEGATIVE

## 2024-01-11 LAB — TROPONIN I (HIGH SENSITIVITY)
Troponin I (High Sensitivity): 24 ng/L — ABNORMAL HIGH (ref <18)
Troponin I (High Sensitivity): 17 ng/L (ref <18)

## 2024-01-11 LAB — BRAIN NATRIURETIC PEPTIDE: B Natriuretic Peptide: 608.4 pg/mL — ABNORMAL HIGH (ref 0.0–100.0)

## 2024-01-11 LAB — APTT: aPTT: 38 s — ABNORMAL HIGH (ref 24–36)

## 2024-01-11 LAB — PROTIME-INR
INR: 1.2 (ref 0.8–1.2)
Prothrombin Time: 15.3 s — ABNORMAL HIGH (ref 11.4–15.2)

## 2024-01-11 LAB — LIPASE, BLOOD: Lipase: 32 U/L (ref 11–51)

## 2024-01-11 MED ORDER — PREDNISONE 20 MG PO TABS
40.0000 mg | ORAL_TABLET | Freq: Once | ORAL | Status: AC
  Administered 2024-01-11: 40 mg via ORAL
  Filled 2024-01-11: qty 2

## 2024-01-11 MED ORDER — DOXYCYCLINE HYCLATE 100 MG PO TABS
100.0000 mg | ORAL_TABLET | Freq: Two times a day (BID) | ORAL | 0 refills | Status: AC
Start: 2024-01-11 — End: 2024-01-21

## 2024-01-11 MED ORDER — MORPHINE SULFATE (PF) 2 MG/ML IV SOLN
2.0000 mg | Freq: Once | INTRAVENOUS | Status: AC
  Administered 2024-01-11: 2 mg via INTRAVENOUS
  Filled 2024-01-11: qty 1

## 2024-01-11 MED ORDER — DOXYCYCLINE HYCLATE 100 MG PO TABS
100.0000 mg | ORAL_TABLET | Freq: Once | ORAL | Status: AC
  Administered 2024-01-11: 100 mg via ORAL
  Filled 2024-01-11: qty 1

## 2024-01-11 MED ORDER — ACETAMINOPHEN 500 MG PO TABS: 1000.0000 mg | ORAL_TABLET | Freq: Once | ORAL | Status: DC

## 2024-01-11 MED ORDER — IPRATROPIUM-ALBUTEROL 0.5-2.5 (3) MG/3ML IN SOLN
9.0000 mL | Freq: Once | RESPIRATORY_TRACT | Status: AC
  Administered 2024-01-11: 9 mL via RESPIRATORY_TRACT
  Filled 2024-01-11: qty 3

## 2024-01-11 MED ORDER — PREDNISONE 20 MG PO TABS
40.0000 mg | ORAL_TABLET | Freq: Every day | ORAL | 0 refills | Status: AC
Start: 2024-01-11 — End: 2024-01-15

## 2024-01-11 MED ORDER — NITROGLYCERIN 0.4 MG SL SUBL
0.4000 mg | SUBLINGUAL_TABLET | Freq: Once | SUBLINGUAL | Status: AC
  Administered 2024-01-11: 0.4 mg via SUBLINGUAL
  Filled 2024-01-11: qty 1

## 2024-01-11 NOTE — Discharge Instructions (Signed)
 Please take the medications as prescribed for COPD exacerbation.  Please follow-up with your cardiologist as well as her primary care doctor for further management of your symptoms.

## 2024-01-11 NOTE — ED Provider Notes (Signed)
 Lawrence [name] Provider Note    Event Date/Time   First MD Initiated Contact with Patient 01/11/24 0016     (approximate)   History   Chest Pain   HPI  Lawrence [name] is a 68 y.o. male history of CAD status post cardiac bypass surgery, COPD, CHF presenting with chest pressure that started 2 hours prior to presentation.  States that is in the midsternal section and also on the left.  Does not radiate anywhere else.  Has some mild shortness of breath and cough but does have history of COPD.  States that shortness of breath and cough are little bit worse than prior.  No unilateral calf swelling or tenderness although he states that his feels like his lower extremities are tight bilaterally.  No abdominal pain, no nausea, vomiting, diarrhea, fever.  States he took 3 sublingual nitro's without any improvement of symptoms.  On independent chart review he was seen by his primary care doctor in mid February, has history of recurrent chest pain and multiple ER visits for this.  They suspect this is nerve related pain possibly due to his prior CABG.  Will started on gabapentin for nerve pain as well as oxycodone as needed.      Physical Exam   Triage Vital Signs: ED Triage Vitals  Encounter Vitals Group     BP 01/11/24 0017 (!) 192/117     Systolic BP Percentile --      Diastolic BP Percentile --      Pulse Rate 01/11/24 0019 63     Resp 01/11/24 0017 13     Temp 01/11/24 0021 98.1 F (36.7 C)     Temp Source 01/11/24 0021 Oral     SpO2 01/11/24 0019 93 %     Weight 01/11/24 0019 185 lb (83.9 kg)     Height 01/11/24 0019 5\' 7"  (1.702 m)     Head Circumference --      Peak Flow --      Pain Score --      Pain Loc --      Pain Education --      Exclude from Growth Chart --     Most recent vital signs: Vitals:   01/11/24 0230 01/11/24 0330  BP: (!) 180/132 (!) 157/95  Pulse: 89 93  Resp: (!) 23 16  Temp:    SpO2: 94% 91%     General: Awake, no  distress.  CV:  Good peripheral perfusion.  Resp:  Normal effort.  Wheezing bilaterally, no increased work of breathing, no retractions Abd:  No distention.  Soft nontender Other:  No thoracic cage tenderness, no unilateral calf swelling or tenderness, no obvious lower extremity edema.   ED Results / Procedures / Treatments   Labs (all labs ordered are listed, but only abnormal results are displayed) Labs Reviewed  COMPREHENSIVE METABOLIC PANEL - Abnormal; Notable for the following components:      Result Value   Glucose, Bld 135 (*)    Total Protein 6.4 (*)    Total Bilirubin 1.4 (*)    All other components within normal limits  BRAIN NATRIURETIC PEPTIDE - Abnormal; Notable for the following components:   B Natriuretic Peptide 608.4 (*)    All other components within normal limits  BLOOD GAS, VENOUS - Abnormal; Notable for the following components:   pO2, Ven 61 (*)    Bicarbonate 31.8 (*)    Acid-Base Excess 6.1 (*)    All  other components within normal limits  PROTIME-INR - Abnormal; Notable for the following components:   Prothrombin Time 15.3 (*)    All other components within normal limits  APTT - Abnormal; Notable for the following components:   aPTT 38 (*)    All other components within normal limits  TROPONIN I (HIGH SENSITIVITY) - Abnormal; Notable for the following components:   Troponin I (High Sensitivity) 24 (*)    All other components within normal limits  RESP PANEL BY RT-PCR (RSV, FLU A&B, COVID)  RVPGX2  CBC WITH DIFFERENTIAL/PLATELET  LIPASE, BLOOD  TROPONIN I (HIGH SENSITIVITY)     EKG  Atrial fibrillation rate of 74, normal QRS, normal QTc T wave flattening in 1, aVL, no ischemic ST elevation, not significant change compared to prior   RADIOLOGY Chest x-ray on my interpretation without focal consolidation   PROCEDURES:  Critical Care performed: No  Procedures   MEDICATIONS ORDERED IN ED: Medications  acetaminophen (TYLENOL) tablet 1,000  mg (1,000 mg Oral Patient Refused/Not Given 01/11/24 0134)  nitroGLYCERIN (NITROSTAT) SL tablet 0.4 mg (0.4 mg Sublingual Given 01/11/24 0043)  ipratropium-albuterol (DUONEB) 0.5-2.5 (3) MG/3ML nebulizer solution 9 mL (9 mLs Nebulization Given 01/11/24 0043)  predniSONE (DELTASONE) tablet 40 mg (40 mg Oral Given 01/11/24 0043)  doxycycline (VIBRA-TABS) tablet 100 mg (100 mg Oral Given 01/11/24 0043)  morphine (PF) 2 MG/ML injection 2 mg (2 mg Intravenous Given 01/11/24 0247)     IMPRESSION / MDM / ASSESSMENT AND PLAN / ED COURSE  I reviewed the triage vital signs and the nursing notes.                              Differential diagnosis includes, but is not limited to, ACS, nerve pain, CHF, COPD exacerbation, musculoskeletal pain.  Also considered viral illness such as influenza, COVID, RSV, also considered pneumonia.  He is not hypoxic, with the wheezing, fever CHF versus COPD as opposed to PE.  Will get labs, EKG, troponin, chest x-ray, BNP.  DuoNebs, prednisone, doxycycline.  Will give him a single nitroglycerin to see if that helps with the chest pain here.  Patient's presentation is most consistent with acute presentation with potential threat to life or bodily function.  Independent review of labs and imaging are below.  Second troponin is negative.  Electrolytes not severely deranged.  He is not hypoxic, tachypneic.  He is safe for outpatient management.  Suspect this might be his chronic chest pain.  Considered but no indication for inpatient admission at this time.  Will discharge with 4 more days of prednisone as well as a prescription for doxycycline for COPD exacerbation.  Strict return precautions given.  Clinical Course as of 01/11/24 0417  Tue Jan 11, 2024  0143 DG Chest Portable 1 View IMPRESSION: No active cardiopulmonary disease.   [TT]  0231 Patient states that his chest pain, typically only morphine helps with the pain.  He was seen by his primary care doctor and he states  that he was given oxycodone for his chest pain. [TT]  [id number] Independent review of labs, troponin is downtrending, BNP is mildly elevated.  No leukocytosis, electrolytes not severely deranged, creatinine is normal, respiratory viral panel is negative, his VBG pH and pCO2 is normal. [TT]    Clinical Course User Index [TT] Jodie Echevaria, Franchot Erichsen, MD     FINAL CLINICAL IMPRESSION(S) / ED DIAGNOSES   Final diagnoses:  Chest pain, unspecified type  COPD exacerbation (HCC)     Rx / DC Orders   ED Discharge Orders          Ordered    predniSONE (DELTASONE) 20 MG tablet  Daily with breakfast        01/11/24 0415    doxycycline (VIBRA-TABS) 100 MG tablet  2 times daily        01/11/24 0415             Note:  This document was prepared using Dragon voice recognition software and may include unintentional dictation errors.    Claybon Jabs, MD 01/11/24 [phone]

## 2024-01-11 NOTE — ED Triage Notes (Signed)
 Patient C/O chest pain that began about two hours and has worsened. Patient has extensive cardiac history. Denies shortness of breath at this time. Patient received 324 of aspirin.

## 2024-01-12 ENCOUNTER — Telehealth: Payer: Self-pay

## 2024-01-12 ENCOUNTER — Other Ambulatory Visit: Payer: Self-pay | Admitting: Family Medicine

## 2024-01-12 DIAGNOSIS — A048 Other specified bacterial intestinal infections: Secondary | ICD-10-CM

## 2024-01-12 NOTE — Telephone Encounter (Signed)
 Copied from CRM [id number]. Topic: General - Other >> Jan 12, 2024 12:21 PM Kristie Cowman wrote: Reason for CRM: Patient calling returning Crystal's  phone call from yesterday.  Patient would like to be called back.   Best phone number - [phone].

## 2024-01-13 ENCOUNTER — Other Ambulatory Visit: Payer: Self-pay

## 2024-01-13 NOTE — Patient Instructions (Signed)
 Thank you for allowing the Care Management team to participate in your care.  It was great speaking with you today!

## 2024-01-13 NOTE — Patient Outreach (Unsigned)
  Care Management   Visit Note  01/13/2024 Name: Lawrence [name] MRN: [id number] DOB: [birth year]  Subjective: Lawrence [name] is a 68 y.o. year old male who is a primary care patient of [name], Demetrios Isaacs, MD. The Care Management team was consulted for assistance.      Engaged with patient {CCMTELEPHONEFACETOFACE:24906}.   Assessment:   Interventions: {CCM RNCM INTERVENTIONS:22296}  Recommendations:     Consent to Services:  {CAREMANAGEMENTCONSENTOPTIONS:24923}  Plan: {CM FOLLOW UP PLAN:22241}  SIG

## 2024-01-13 NOTE — Telephone Encounter (Signed)
 Please verify if patient is taking venlafaxine for depression/anxiety. His medical record states that he stopped taking in in September, but dispense history shows he got prescription for 25mg  (instead of 75 mg) at the beginning of February.

## 2024-01-14 NOTE — Telephone Encounter (Signed)
 Patient reports he is taking the Venlafaxine 25 mg takes two tablets (50mg  ) at bedtime for 7 days then take one tablet daily. He started taking it yesterday.

## 2024-01-14 NOTE — Telephone Encounter (Signed)
 Please verify dosage with pt. See message below

## 2024-01-17 ENCOUNTER — Other Ambulatory Visit: Payer: Self-pay | Admitting: Family Medicine

## 2024-01-18 NOTE — Telephone Encounter (Signed)
 Requested medication (s) are due for refill today: Yes  Requested medication (s) are on the active medication list: Yes  Last refill:  06/07/23  Future visit scheduled: Yes  Notes to clinic:  Historical provider.    Requested Prescriptions  Pending Prescriptions Disp Refills   FARXIGA 10 MG TABS tablet [Pharmacy Med Name: FARXIGA 10 MG TAB] 30 tablet     Sig: TAKE 1 TABLET BY MOUTH DAILY     Endocrinology:  Diabetes - SGLT2 Inhibitors Passed - 01/18/2024  2:09 PM      Passed - Cr in normal range and within 360 days    Creatinine  Date Value Ref Range Status  03/07/2015 0.84 mg/dL Final    Comment:    3.08-6.57 NOTE: New Reference Range  01/22/15    Creatinine, Ser  Date Value Ref Range Status  01/11/2024 0.90 0.61 - 1.24 mg/dL Final   Creatinine, POC  Date Value Ref Range Status  10/27/2016 n/a mg/dL Final         Passed - HBA1C is between 0 and 7.9 and within 180 days    Hemoglobin A1C  Date Value Ref Range Status  10/15/2018 6.8  Final   Hgb A1c MFr Bld  Date Value Ref Range Status  10/18/2023 6.2 (H) 4.8 - 5.6 % Final    Comment:    (NOTE) Pre diabetes:          5.7%-6.4%  Diabetes:              >6.4%  Glycemic control for   <7.0% adults with diabetes    A1c  Date Value Ref Range Status  02/19/2016 6.5  Final         Passed - eGFR in normal range and within 360 days    EGFR (African American)  Date Value Ref Range Status  03/07/2015 >60  Final   GFR calc Af Amer  Date Value Ref Range Status  09/24/2020 97 >59 mL/min/1.73 Final    Comment:    **In accordance with recommendations from the NKF-ASN Task force,**   [facility] is in the process of updating its eGFR calculation to the   2021 CKD-EPI creatinine equation that estimates kidney function   without a race variable.    EGFR (Non-African Amer.)  Date Value Ref Range Status  03/07/2015 >60  Final    Comment:    eGFR values <44mL/min/1.73 m2 may be an indication of chronic kidney disease  (CKD). Calculated eGFR is useful in patients with stable renal function. The eGFR calculation will not be reliable in acutely ill patients when serum creatinine is changing rapidly. It is not useful in patients on dialysis. The eGFR calculation may not be applicable to patients at the low and high extremes of body sizes, pregnant women, and vegetarians.    GFR, Estimated  Date Value Ref Range Status  01/11/2024 >60 >60 mL/min Final    Comment:    (NOTE) Calculated using the CKD-EPI Creatinine Equation (2021)    eGFR  Date Value Ref Range Status  09/03/2023 80 >59 mL/min/1.73 Final         Passed - Valid encounter within last 6 months    Recent Outpatient Visits           1 month ago Chronic respiratory failure with hypoxia [facility])   [place] [facility] Malva Limes, MD   3 months ago Chronic hip pain, left   [facility] [place], [place], [place]  4 months ago 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 Malva Limes, MD   6 months ago FTT (failure to thrive) in adult   [facility] Merita Norton T, FNP   7 months ago Coronary artery disease of native artery of native heart with stable angina pectoris [facility])   [place] [facility] Malva Limes, MD       Future Appointments             In 6 days [name], Demetrios Isaacs, MD [name]   In 1 month [name], Demetrios Isaacs, MD [facility], [name]

## 2024-01-21 ENCOUNTER — Emergency Department
Admission: EM | Admit: 2024-01-21 | Discharge: 2024-01-22 | Disposition: A | Discharge status: Home / Self Care | Attending: Emergency Medicine | Admitting: Emergency Medicine

## 2024-01-21 ENCOUNTER — Emergency Department

## 2024-01-21 ENCOUNTER — Other Ambulatory Visit: Payer: Self-pay

## 2024-01-21 DIAGNOSIS — M7989 Other specified soft tissue disorders: Secondary | ICD-10-CM | POA: Diagnosis not present

## 2024-01-21 DIAGNOSIS — R0602 Shortness of breath: Secondary | ICD-10-CM | POA: Insufficient documentation

## 2024-01-21 DIAGNOSIS — E119 Type 2 diabetes mellitus without complications: Secondary | ICD-10-CM | POA: Insufficient documentation

## 2024-01-21 DIAGNOSIS — I110 Hypertensive heart disease with heart failure: Secondary | ICD-10-CM | POA: Insufficient documentation

## 2024-01-21 DIAGNOSIS — I4891 Unspecified atrial fibrillation: Secondary | ICD-10-CM | POA: Insufficient documentation

## 2024-01-21 DIAGNOSIS — Z7901 Long term (current) use of anticoagulants: Secondary | ICD-10-CM | POA: Insufficient documentation

## 2024-01-21 DIAGNOSIS — R072 Precordial pain: Secondary | ICD-10-CM | POA: Diagnosis present

## 2024-01-21 DIAGNOSIS — I5032 Chronic diastolic (congestive) heart failure: Secondary | ICD-10-CM | POA: Diagnosis not present

## 2024-01-21 DIAGNOSIS — J449 Chronic obstructive pulmonary disease, unspecified: Secondary | ICD-10-CM | POA: Insufficient documentation

## 2024-01-21 DIAGNOSIS — I714 Abdominal aortic aneurysm, without rupture, unspecified: Secondary | ICD-10-CM | POA: Insufficient documentation

## 2024-01-21 DIAGNOSIS — R600 Localized edema: Secondary | ICD-10-CM | POA: Insufficient documentation

## 2024-01-21 DIAGNOSIS — R079 Chest pain, unspecified: Secondary | ICD-10-CM

## 2024-01-21 LAB — CBC
HCT: 51.9 % (ref 39.0–52.0)
Hemoglobin: 16.4 g/dL (ref 13.0–17.0)
MCH: 28.6 pg (ref 26.0–34.0)
MCHC: 31.6 g/dL (ref 30.0–36.0)
MCV: 90.6 fL (ref 80.0–100.0)
Platelets: 174 10*3/uL (ref 150–400)
RBC: 5.73 MIL/uL (ref 4.22–5.81)
RDW: 13.8 % (ref 11.5–15.5)
WBC: 7.5 10*3/uL (ref 4.0–10.5)
nRBC: 0 % (ref 0.0–0.2)

## 2024-01-21 LAB — BASIC METABOLIC PANEL
Anion gap: 7 (ref 5–15)
BUN: 15 mg/dL (ref 8–23)
CO2: 33 mmol/L — ABNORMAL HIGH (ref 22–32)
Calcium: 9.4 mg/dL (ref 8.9–10.3)
Chloride: 98 mmol/L (ref 98–111)
Creatinine, Ser: 0.83 mg/dL (ref 0.61–1.24)
GFR, Estimated: >60 mL/min (ref >60)
Glucose, Bld: 105 mg/dL — ABNORMAL HIGH (ref 70–99)
Potassium: 3.8 mmol/L (ref 3.5–5.1)
Sodium: 138 mmol/L (ref 135–145)

## 2024-01-21 LAB — TROPONIN I (HIGH SENSITIVITY): Troponin I (High Sensitivity): 13 ng/L (ref <18)

## 2024-01-21 NOTE — ED Provider Notes (Signed)
 [facility] Provider Note    Event Date/Time   First MD Initiated Contact with Patient 01/21/24 2329     (approximate)   History   Chest Pain   HPI  Lawrence [name] is a 68 y.o. male   Past medical history of extensive cardiac disease, atrial fibrillation on Eliquis, chronic diastolic congestive heart failure, COPD, type II diabetic who presents emerged from with substernal chest pain rating to the left side of chest and left shoulder.  Started tonight around 8:00 when he was doing dishes.  He has been fully compliant with medications including Eliquis.  He has not been sick in any other way and denies respiratory infectious symptoms or preceding chest pain whatsoever in the last several days.  This chest pain is reminiscent of his heart attack in the past.  He also has some mild shortness of breath.  He wonders if he his CHF is acting up as he has had some swelling in his ankles as well.  He has been compliant with his diuretic and his Eliquis.  External Medical Documents Reviewed: Emergency department evaluation from [facility] emergency department on 01/18/2024 just 4 days ago for chest pain, with unremarkable workup ultimately discharged got a single dose of diuretic IV then      Physical Exam   Triage Vital Signs: ED Triage Vitals  Encounter Vitals Group     BP 01/21/24 2258 (!) 181/127     Systolic BP Percentile --      Diastolic BP Percentile --      Pulse Rate 01/21/24 2258 94     Resp 01/21/24 2258 16     Temp 01/21/24 2258 97.9 F (36.6 C)     Temp Source 01/21/24 2258 Oral     SpO2 01/21/24 2258 100 %     Weight 01/21/24 2259 180 lb (81.6 kg)     Height 01/21/24 2259 5\' 7"  (1.702 m)     Head Circumference --      Peak Flow --      Pain Score 01/21/24 2259 9     Pain Loc --      Pain Education --      Exclude from Growth Chart --     Most recent vital signs: Vitals:   01/21/24 2258 01/22/24 0020  BP: (!) 181/127 (!) 164/103  Pulse:  94 88  Resp: 16 11  Temp: 97.9 F (36.6 C)   SpO2: 100% 98%    General: Awake, no distress.  CV:  Good peripheral perfusion.  Resp:  Normal effort.  Abd:  No distention.  Other:  Awake alert comfortable appearing with hypertension otherwise vital signs normal.  Lungs without any obvious focality wheezing or rales.  His abdomen is soft and nontender deep palpation all quadrants.  There is very scant edema to bilateral ankles but not floridly overloaded on my exam.   ED Results / Procedures / Treatments   Labs (all labs ordered are listed, but only abnormal results are displayed) Labs Reviewed  BASIC METABOLIC PANEL - Abnormal; Notable for the following components:      Result Value   CO2 33 (*)    Glucose, Bld 105 (*)    All other components within normal limits  HEPATIC FUNCTION PANEL - Abnormal; Notable for the following components:   AST 13 (*)    All other components within normal limits  BRAIN NATRIURETIC PEPTIDE - Abnormal; Notable for the following components:   B Natriuretic Peptide  235.4 (*)    All other components within normal limits  CBC  LIPASE, BLOOD  TROPONIN I (HIGH SENSITIVITY)  TROPONIN I (HIGH SENSITIVITY)     I ordered and reviewed the above labs they are notable for initial troponin is 13, electrolytes unremarkable.  EKG  ED ECG REPORT I, Pilar Jarvis, the attending physician, personally viewed and interpreted this ECG.   Date: 01/21/2024  EKG Time: 2300  Rate: 87  Rhythm: AF  Axis: nl  Intervals:none  ST&T Change: no stemi    RADIOLOGY I independently reviewed and interpreted chest x-ray see no obvious focality pneumothorax I also reviewed radiologist's formal read.   PROCEDURES:  Critical Care performed: No  Procedures   MEDICATIONS ORDERED IN ED: Medications  alum & mag hydroxide-simeth (MAALOX/MYLANTA) 200-200-20 MG/5ML suspension 30 mL (30 mLs Oral Given 01/22/24 0024)    And  lidocaine (XYLOCAINE) 2 % viscous mouth solution 15  mL (15 mLs Oral Given 01/22/24 0024)  morphine (PF) 4 MG/ML injection 4 mg (4 mg Intravenous Given 01/22/24 0021)  morphine (PF) 4 MG/ML injection 4 mg (4 mg Intravenous Given 01/22/24 0132)  iohexol (OMNIPAQUE) 350 MG/ML injection 100 mL (100 mLs Intravenous Contrast Given 01/22/24 0120)     IMPRESSION / MDM / ASSESSMENT AND PLAN / ED COURSE  I reviewed the triage vital signs and the nursing notes.                                Patient's presentation is most consistent with acute presentation with potential threat to life or bodily function.  Differential diagnosis includes, but is not limited to, ACS, PE, dissection, musculoskeletal pain, respiratory infection, pneumothorax   The patient is on the cardiac monitor to evaluate for evidence of arrhythmia and/or significant heart rate changes.  MDM:    Is a patient with high risk chest pain history of CAD and atrial fibrillation on Eliquis.  He had similar presentation just 4 days ago evaluated outside hospital with normal cardiac workup and was discharged.  However his pain is quite severe and he is back to the emergency department today.  I doubt PE given his compliance with Eliquis.  Considered ACS with his history of the same though his EKG is nonischemic and initial troponin is negative, will follow-up with serial troponin.  I did consider dissection as well given the severity of pain and radiation to the left upper extremity, I did check for pulse deficits there is no obvious pulse deficits and his blood pressure is same in both arms.  Given no obvious ischemic changes on initial workup and his severity of pain I think it prudent to check a CT angiogram for dissection. IV morphine for pain. Got asa prior to arrival.   --  Workup unremarkable patient remains stable serial troponins have been normal and flat and CT angiogram shows no emergency conditions that account for pain.  I reviewed his recent medical chart and there have been multiple  emergency department visits over the last couple of weeks for similar pain that have been unrevealing.  He should follow-up with his primary doctor and cardiologist for further evaluation as an outpatient.  Given strict return precautions       FINAL CLINICAL IMPRESSION(S) / ED DIAGNOSES   Final diagnoses:  Nonspecific chest pain     Rx / DC Orders   ED Discharge Orders     None  Note:  This document was prepared using Dragon voice recognition software and may include unintentional dictation errors.    Pilar Jarvis, MD 01/22/24 [phone]

## 2024-01-21 NOTE — ED Triage Notes (Signed)
 Pt presents to ER from home with complaints of chest pain for the past 2 hrs. Pt reports he was sitting on the cough and got up to do the kitchen and that's when pain started. Pt reports runny nose. Pt talks in complete sentences no respiratory distress noted. Pt reports hx of double by past and 4 stents, and afib

## 2024-01-22 ENCOUNTER — Emergency Department

## 2024-01-22 DIAGNOSIS — R072 Precordial pain: Secondary | ICD-10-CM | POA: Diagnosis not present

## 2024-01-22 LAB — HEPATIC FUNCTION PANEL
ALT: 15 U/L (ref 0–44)
AST: 13 U/L — ABNORMAL LOW (ref 15–41)
Albumin: 3.8 g/dL (ref 3.5–5.0)
Alkaline Phosphatase: 60 U/L (ref 38–126)
Bilirubin, Direct: 0.1 mg/dL (ref 0.0–0.2)
Indirect Bilirubin: 0.8 mg/dL (ref 0.3–0.9)
Total Bilirubin: 0.9 mg/dL (ref 0.0–1.2)
Total Protein: 6.6 g/dL (ref 6.5–8.1)

## 2024-01-22 LAB — TROPONIN I (HIGH SENSITIVITY): Troponin I (High Sensitivity): 12 ng/L (ref <18)

## 2024-01-22 LAB — BRAIN NATRIURETIC PEPTIDE: B Natriuretic Peptide: 235.4 pg/mL — ABNORMAL HIGH (ref 0.0–100.0)

## 2024-01-22 LAB — LIPASE, BLOOD: Lipase: 33 U/L (ref 11–51)

## 2024-01-22 MED ORDER — LIDOCAINE VISCOUS HCL 2 % MT SOLN
15.0000 mL | Freq: Once | OROMUCOSAL | Status: AC
  Administered 2024-01-22: 15 mL via ORAL
  Filled 2024-01-22: qty 15

## 2024-01-22 MED ORDER — ALUM & MAG HYDROXIDE-SIMETH 200-200-20 MG/5ML PO SUSP
30.0000 mL | Freq: Once | ORAL | Status: AC
  Administered 2024-01-22: 30 mL via ORAL
  Filled 2024-01-22: qty 30

## 2024-01-22 MED ORDER — MORPHINE SULFATE (PF) 4 MG/ML IV SOLN
4.0000 mg | Freq: Once | INTRAVENOUS | Status: AC
  Administered 2024-01-22: 4 mg via INTRAVENOUS
  Filled 2024-01-22: qty 1

## 2024-01-22 MED ORDER — IOHEXOL 350 MG/ML SOLN
100.0000 mL | Freq: Once | INTRAVENOUS | Status: AC | PRN
  Administered 2024-01-22: 100 mL via INTRAVENOUS

## 2024-01-22 NOTE — Discharge Instructions (Addendum)
 Fortunately your evaluation in the emergency department did not show any emergency conditions account for your chest pain.  There was a pulmonary nodule (a nodule in your lung) that you should follow-up with your doctor for future evaluations as needed.  It did not appear that you are having a heart attack, blood clot, tear in the major arteries or any other life-threatening emergency at this time, fortunately.  Please call your cardiologist and primary doctor for follow-up appointment this week.  Thank you for choosing [place] for your health care today!  Please see your primary doctor this week for a follow up appointment.   If you have any new, worsening, or unexpected symptoms call your doctor right away or come back to the emergency department for reevaluation.  It was my pleasure to care for you today.   Daneil Dan Modesto Charon, MD

## 2024-01-24 ENCOUNTER — Ambulatory Visit (INDEPENDENT_AMBULATORY_CARE_PROVIDER_SITE_OTHER): Payer: Medicare HMO | Admitting: Family Medicine

## 2024-01-24 ENCOUNTER — Encounter: Payer: Self-pay | Admitting: Family Medicine

## 2024-01-24 VITALS — BP 160/100 | HR 88 | Resp 16 | Ht 67.0 in | Wt 183.0 lb

## 2024-01-24 DIAGNOSIS — I482 Chronic atrial fibrillation, unspecified: Secondary | ICD-10-CM | POA: Diagnosis not present

## 2024-01-24 DIAGNOSIS — I25112 Atherosclerotic heart disease of native coronary artery with refractory angina pectoris: Secondary | ICD-10-CM

## 2024-01-24 DIAGNOSIS — I5042 Chronic combined systolic (congestive) and diastolic (congestive) heart failure: Secondary | ICD-10-CM | POA: Diagnosis not present

## 2024-01-24 DIAGNOSIS — M792 Neuralgia and neuritis, unspecified: Secondary | ICD-10-CM | POA: Diagnosis not present

## 2024-01-24 NOTE — Progress Notes (Unsigned)
 Established patient visit   Patient: Lawrence [name]   DOB: 15-Jan-1956   68 y.o. Male  MRN: [id number] Visit Date: 01/24/2024  Today's healthcare provider: [name], MD   Chief Complaint  Patient presents with   Hospitalization Follow-up    ER follow-up-chest pain   Subjective    HPI Follow up Er visits x 3 over last month for CP. Repeatedly has normal cardiac enzymes. Last visit here 2/14 and started on gabapentin for suspected neuropathic chest pain. Has just been taking at night and states he is sleeping much better and not as sleepy during the day. Has not yet titrated to BID. No new complaints. Has follow up cardiology on 3/24.   Medications: Outpatient Medications Prior to Visit  Medication Sig   Accu-Chek Softclix Lancets lancets Use as instructed to check sugar daily for type 2 diabetes.   allopurinol (ZYLOPRIM) 300 MG tablet Take 300 mg by mouth 2 (two) times daily.   ALPRAZolam (XANAX) 1 MG tablet Take 1 tablet (1 mg total) by mouth 3 (three) times daily as needed.   amLODipine (NORVASC) 10 MG tablet Take 10 mg by mouth daily.   apixaban (ELIQUIS) 5 MG TABS tablet Take 1 tablet (5 mg total) by mouth 2 (two) times daily.   atorvastatin (LIPITOR) 80 MG tablet TAKE 1 TABLET BY MOUTH AT BEDTIME   Blood Glucose Calibration (ACCU-CHEK GUIDE CONTROL) LIQD Use with blood glucose monitor as directed   Blood Glucose Monitoring Suppl (ACCU-CHEK GUIDE) w/Device KIT Use to check blood sugars as directed   Budeson-Glycopyrrol-Formoterol (BREZTRI AEROSPHERE) 160-9-4.8 MCG/ACT AERO Inhale 2 puffs into the lungs 2 (two) times daily.   cetirizine (ZYRTEC) 10 MG tablet TAKE 1 TABLET BY MOUTH AT BEDTIME   Cyanocobalamin (VITAMIN B-12) 1000 MCG SUBL Place 1 tablet under the tongue daily.   dapagliflozin propanediol (FARXIGA) 10 MG TABS tablet TAKE 1 TABLET BY MOUTH DAILY   gabapentin (NEURONTIN) 300 MG capsule Take 1 capsule at bedtime for 1 week, then increase to 1 twice a day    glucose blood (ACCU-CHEK GUIDE) test strip Use as instructed to check sugar daily for type 2 diabetes.   hydrOXYzine (VISTARIL) 25 MG capsule Take 1 capsule (25 mg total) by mouth 3 (three) times daily as needed.   iron polysaccharides (FERREX 150) 150 MG capsule TAKE 1 CAPSULE BY MOUTH DAILY   isosorbide mononitrate (IMDUR) 60 MG 24 hr tablet TAKE 1 TABLET BY MOUTH DAILY   lidocaine (LIDODERM) 5 % Place 1 patch onto the skin daily. Remove & Discard patch within 12 hours or as directed by MD   metFORMIN (GLUCOPHAGE-XR) 500 MG 24 hr tablet TAKE 1 TABLET BY MOUTH DAILY WITH EVENING MEAL   methocarbamol (ROBAXIN) 500 MG tablet TAKE 1 TABLET BY MOUTH EVERY 8 HOURS AS NEEDED FOR MUSCLE SPASMS   nitroGLYCERIN (NITROSTAT) 0.4 MG SL tablet Place 1 tablet (0.4 mg total) under the tongue every 5 (five) minutes as needed for chest pain (Do not take if blood pressure is low, systolic BP<110).   omega-3 acid ethyl esters (LOVAZA) 1 g capsule TAKE 4 CAPSULES (4 GRAMS TOTAL) BY MOUTHDAILY   oxyCODONE-acetaminophen (PERCOCET) 10-325 MG tablet TAKE 1 TABLET BY MOUTH EVERY 4 HOURS AS NEEDED FOR PAIN.   OXYGEN Inhale 2 L into the lungs at bedtime as needed (for shortness of breath).   pantoprazole (PROTONIX) 40 MG tablet TAKE 1 TABLET BY MOUTH 2 TIMES DAILY ( BEFORE A MEAL)   ranolazine (  RANEXA) 1000 MG SR tablet Take 1 tablet (1,000 mg total) by mouth 2 (two) times daily.   sacubitril-valsartan (ENTRESTO) 24-26 MG Take 1 tablet by mouth 2 (two) times daily.   sennosides-docusate sodium (SENOKOT-S) 8.6-50 MG tablet Take 1 tablet by mouth daily.   spironolactone (ALDACTONE) 25 MG tablet Take 25 mg by mouth daily.   sucralfate (CARAFATE) 1 GM/10ML suspension Take 10 mLs (1 g total) by mouth 4 (four) times daily.   traZODone (DESYREL) 100 MG tablet Take 1 tablet (100 mg total) by mouth at bedtime as needed for sleep.   venlafaxine (EFFEXOR) 25 MG tablet Take 1 tablet (25 mg total) by mouth daily.   venlafaxine  XR (EFFEXOR-XR) 75 MG 24 hr capsule Take 75 mg by mouth daily with breakfast.   No facility-administered medications prior to visit.    Review of Systems  Constitutional:  Negative for appetite change, chills and fever.  Respiratory:  Negative for chest tightness, shortness of breath and wheezing.   Cardiovascular:  Negative for chest pain and palpitations.  Gastrointestinal:  Negative for abdominal pain, nausea and vomiting.   {Insert previous labs (optional):23779} {See past labs  Heme  Chem  Endocrine  Serology  Results Review (optional):1}   Objective    BP (!) 160/100 (BP Location: Left Arm, Patient Position: Sitting, Cuff Size: Normal) Comment: manual  Pulse 88   Resp 16   Ht 5\' 7"  (1.702 m)   Wt 183 lb (83 kg)   SpO2 100%   BMI 28.66 kg/m  {Insert last BP/Wt (optional):23777}{See vitals history (optional):1}  Physical Exam   General: Appearance:    Well developed, well nourished male in no acute distress  Eyes:    PERRL, conjunctiva/corneas clear, EOM's intact       Lungs:     Clear to auscultation bilaterally, respirations unlabored  Heart:    Normal heart rate. Regular rhythm. No murmurs, rubs, or gallops.    MS:   All extremities are intact.    Neurologic:   Awake, alert, oriented x 3. No apparent focal neurological defect.         Assessment & Plan     1. Neuropathic pain of chest (Primary) Now on 300 gabapentin hs and sleeping much better, but daytime CP not improved. He is going to go ahead and change to 300 BID and follow up here 4/24  2. 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 Chronic atrial fibrillation with RVR (HCC)   4. Chronic combined systolic (congestive) and diastolic (congestive) heart failure (HCC) Follow up cardiology later this month as scheduled.   Advised we could titrated gabapentin up further prior to April appointment if he calls back and lets me know.          [name], MD  [facility] [phone] (phone) [phone] (fax)  [facility]

## 2024-01-24 NOTE — Patient Instructions (Signed)
 Marland Kitchen  Please review the attached list of medications and notify my office if there are any errors.   . Please bring all of your medications to every appointment so we can make sure that our medication list is the same as yours.

## 2024-01-26 ENCOUNTER — Emergency Department

## 2024-01-26 ENCOUNTER — Encounter: Payer: Self-pay | Admitting: *Deleted

## 2024-01-26 ENCOUNTER — Other Ambulatory Visit: Payer: Self-pay

## 2024-01-26 DIAGNOSIS — E119 Type 2 diabetes mellitus without complications: Secondary | ICD-10-CM | POA: Insufficient documentation

## 2024-01-26 DIAGNOSIS — Z7901 Long term (current) use of anticoagulants: Secondary | ICD-10-CM | POA: Insufficient documentation

## 2024-01-26 DIAGNOSIS — I2510 Atherosclerotic heart disease of native coronary artery without angina pectoris: Secondary | ICD-10-CM | POA: Insufficient documentation

## 2024-01-26 DIAGNOSIS — J449 Chronic obstructive pulmonary disease, unspecified: Secondary | ICD-10-CM | POA: Insufficient documentation

## 2024-01-26 DIAGNOSIS — G8929 Other chronic pain: Secondary | ICD-10-CM | POA: Diagnosis not present

## 2024-01-26 DIAGNOSIS — R079 Chest pain, unspecified: Secondary | ICD-10-CM | POA: Diagnosis present

## 2024-01-26 LAB — CBC
HCT: 49.1 % (ref 39.0–52.0)
Hemoglobin: 15.7 g/dL (ref 13.0–17.0)
MCH: 28.9 pg (ref 26.0–34.0)
MCHC: 32 g/dL (ref 30.0–36.0)
MCV: 90.3 fL (ref 80.0–100.0)
Platelets: 171 10*3/uL (ref 150–400)
RBC: 5.44 MIL/uL (ref 4.22–5.81)
RDW: 14.1 % (ref 11.5–15.5)
WBC: 7.1 10*3/uL (ref 4.0–10.5)
nRBC: 0 % (ref 0.0–0.2)

## 2024-01-26 LAB — BASIC METABOLIC PANEL
Anion gap: 8 (ref 5–15)
BUN: 16 mg/dL (ref 8–23)
CO2: 27 mmol/L (ref 22–32)
Calcium: 9.3 mg/dL (ref 8.9–10.3)
Chloride: 104 mmol/L (ref 98–111)
Creatinine, Ser: 0.86 mg/dL (ref 0.61–1.24)
GFR, Estimated: >60 mL/min (ref >60)
Glucose, Bld: 104 mg/dL — ABNORMAL HIGH (ref 70–99)
Potassium: 3.7 mmol/L (ref 3.5–5.1)
Sodium: 139 mmol/L (ref 135–145)

## 2024-01-26 LAB — TROPONIN I (HIGH SENSITIVITY): Troponin I (High Sensitivity): 13 ng/L (ref <18)

## 2024-01-26 LAB — PROTIME-INR
INR: 1.1 (ref 0.8–1.2)
Prothrombin Time: 14.7 s (ref 11.4–15.2)

## 2024-01-26 NOTE — ED Triage Notes (Signed)
 Pt reports chest pain since 2100 tonight.  Pt has pain in center of chest and into left arm.  Pt has nausea.  Pt also reports sob.  .  No cough  pt alert  speech clear.

## 2024-01-27 ENCOUNTER — Emergency Department
Admission: EM | Admit: 2024-01-27 | Discharge: 2024-01-27 | Disposition: A | Discharge status: Home / Self Care | Attending: Emergency Medicine | Admitting: Emergency Medicine

## 2024-01-27 DIAGNOSIS — G8929 Other chronic pain: Secondary | ICD-10-CM

## 2024-01-27 DIAGNOSIS — R079 Chest pain, unspecified: Secondary | ICD-10-CM | POA: Diagnosis not present

## 2024-01-27 LAB — TROPONIN I (HIGH SENSITIVITY): Troponin I (High Sensitivity): 12 ng/L (ref <18)

## 2024-01-27 MED ORDER — IPRATROPIUM-ALBUTEROL 0.5-2.5 (3) MG/3ML IN SOLN
3.0000 mL | Freq: Once | RESPIRATORY_TRACT | Status: AC
  Administered 2024-01-27: 3 mL via RESPIRATORY_TRACT
  Filled 2024-01-27: qty 3

## 2024-01-27 MED ORDER — PREDNISONE 20 MG PO TABS
60.0000 mg | ORAL_TABLET | Freq: Once | ORAL | Status: AC
Start: 2024-01-27 — End: 2024-01-27
  Administered 2024-01-27: 60 mg via ORAL
  Filled 2024-01-27: qty 3

## 2024-01-27 MED ORDER — HYDROMORPHONE HCL 1 MG/ML IJ SOLN
0.5000 mg | Freq: Once | INTRAMUSCULAR | Status: AC
  Administered 2024-01-27: 0.5 mg via INTRAMUSCULAR
  Filled 2024-01-27: qty 0.5

## 2024-01-27 NOTE — Discharge Instructions (Signed)
 Please follow-up with your cardiology team and primary care provider for ongoing treatment of your chronic chest pain symptoms.  Your workup is otherwise reassuring here today.

## 2024-01-27 NOTE — ED Notes (Signed)
 Pt advised that he would have to sit in waiting room until 6am when he could call a cab. Pt sat in waiting room by window.

## 2024-01-27 NOTE — ED Provider Notes (Signed)
 [facility] Provider Note    Event Date/Time   First MD Initiated Contact with Patient 01/27/24 0231     (approximate)   History   Chest Pain   HPI Lawrence [name] is a 68 y.o. male with history of CAD, prior MIs, A-fib on Eliquis, HFpEF, COPD, DM2 presenting today for chest pain.  Patient states onset of chest pain symptoms around 8 AM yesterday with radiation to his left arm.  He states this feels similar to his chronic chest pain symptoms.  Otherwise denies any difficulty breathing, fever, chills, nausea, vomiting, abdominal pain.  No leg pain or leg swelling.  Otherwise, patient seen frequently in the emergency department for similar symptoms.  Chart review: Patient with many recent ED visits for similar chest pain symptoms.  He has had negative ACS workup multiple times.  He has seen his cardiology and PCP multiple times.  He has also had negative CT imaging for other acute pathology like dissection including 5 days ago.     Physical Exam   Triage Vital Signs: ED Triage Vitals  Encounter Vitals Group     BP 01/26/24 2234 (!) 145/101     Systolic BP Percentile --      Diastolic BP Percentile --      Pulse Rate 01/26/24 2231 90     Resp 01/26/24 2231 20     Temp 01/26/24 2231 98.2 F (36.8 C)     Temp Source 01/27/24 0050 Oral     SpO2 01/26/24 2231 96 %     Weight 01/26/24 2231 185 lb (83.9 kg)     Height 01/26/24 2231 5\' 7"  (1.702 m)     Head Circumference --      Peak Flow --      Pain Score 01/26/24 2231 9     Pain Loc --      Pain Education --      Exclude from Growth Chart --     Most recent vital signs: Vitals:   01/27/24 0050 01/27/24 0235  BP: (!) 167/117 (!) 188/121  Pulse: 83 91  Resp: 18 18  Temp: 98 F (36.7 C) 97.8 F (36.6 C)  SpO2: 95% 99%   Physical Exam: I have reviewed the vital signs and nursing notes. General: Awake, alert, no acute distress.  Nontoxic appearing. Head:  Atraumatic, normocephalic.   ENT:  EOM  intact, PERRL. Oral mucosa is pink and moist with no lesions. Neck: Neck is supple with full range of motion, No meningeal signs. Cardiovascular:  RRR, No murmurs. Peripheral pulses palpable and equal bilaterally. Respiratory:  Symmetrical chest wall expansion.  No rhonchi, rales, or wheezes.  Good air movement throughout.  No use of accessory muscles.   Musculoskeletal:  No cyanosis or edema. Moving extremities with full ROM Abdomen:  Soft, nontender, nondistended. Neuro:  GCS 15, moving all four extremities, interacting appropriately. Speech clear. Psych:  Calm, appropriate.   Skin:  Warm, dry, no rash.     ED Results / Procedures / Treatments   Labs (all labs ordered are listed, but only abnormal results are displayed) Labs Reviewed  BASIC METABOLIC PANEL - Abnormal; Notable for the following components:      Result Value   Glucose, Bld 104 (*)    All other components within normal limits  CBC  PROTIME-INR  TROPONIN I (HIGH SENSITIVITY)  TROPONIN I (HIGH SENSITIVITY)     EKG My EKG interpretation: Rate of 72, A-fib.  Normal axis, normal  intervals.  No acute ST elevations or depressions   RADIOLOGY Independently interpreted chest x-ray with no acute pathology   PROCEDURES:  Critical Care performed: No  Procedures   MEDICATIONS ORDERED IN ED: Medications  HYDROmorphone (DILAUDID) injection 0.5 mg (has no administration in time range)  ipratropium-albuterol (DUONEB) 0.5-2.5 (3) MG/3ML nebulizer solution 3 mL (has no administration in time range)  predniSONE (DELTASONE) tablet 60 mg (has no administration in time range)     IMPRESSION / MDM / ASSESSMENT AND PLAN / ED COURSE  I reviewed the triage vital signs and the nursing notes.                              Differential diagnosis includes, but is not limited to, chronic chest pain, ACS, pneumonia, pneumothorax, COPD exacerbation  Patient's presentation is most consistent with acute complicated illness /  injury requiring diagnostic workup.  Patient is a 68 year old male with extensive prior cardiac medical history but also chronic pain syndrome presenting today for chest pain.  He states this is his usual chest pain in the exact same 1 he is presented to the ED as well as his primary care provider for frequently.  Denied any other acute symptoms.  Exam is unremarkable aside from slight end expiratory wheezing.  Although it does not seem like a true COPD exacerbation at this time.  However, will give DuoNeb, prednisone, and 1 dose of IM pain medicine.  EKG consistent with baseline and troponin negative x 2.  Laboratory workup with BMP, CBC otherwise negative.  Chest x-ray unremarkable.  Reviewed all of his recent ED notes and this does feel similar to all of his prior visits with no obvious abnormalities.  He has even recently had CT imaging of his chest 8 days ago which was unremarkable.  I do not see any benefit of additional imaging this time or admission for cardiac workup.  Patient is agreeable with this and will follow-up outpatient with his cardiology team.  The patient is on the cardiac monitor to evaluate for evidence of arrhythmia and/or significant heart rate changes.     FINAL CLINICAL IMPRESSION(S) / ED DIAGNOSES   Final diagnoses:  Chronic chest pain     Rx / DC Orders   ED Discharge Orders     None        Note:  This document was prepared using Dragon voice recognition software and may include unintentional dictation errors.   Janith Lima, MD 01/27/24 [phone]

## 2024-01-27 NOTE — ED Notes (Signed)
 Pt pending discharge for observation after pain medication administration.

## 2024-01-28 ENCOUNTER — Encounter: Payer: Self-pay | Admitting: *Deleted

## 2024-01-30 NOTE — Patient Outreach (Signed)
 Care Coordination   Collaboration  Visit Note   01/30/2024 Name: Lawrence [name] MRN: [id number] DOB: [birth year]  Lawrence [name] is a 68 y.o. year old male who sees [name], Demetrios Isaacs, MD for primary care. I  contacted patient's RNCM   What matters to the patients health and wellness today?  Home based medical care    Goals Addressed             This Visit's Progress    Care Coordination Activities        Continued plan to collaborate with RNCM regarding status of patient's health condition and referral for home based medical care.            SDOH assessments and interventions completed:  No     Care Coordination Interventions:  Yes, provided  Interventions Today    Flowsheet Row Most Recent Value  Chronic Disease   Chronic disease during today's visit Chronic Obstructive Pulmonary Disease (COPD), Congestive Heart Failure (CHF), Atrial Fibrillation (AFib)  General Interventions   General Interventions Discussed/Reviewed Communication with  Communication with RN  [discussed patient's recent ED visit, current medical status and plan to refer to for home based medical care]       Follow up plan: Follow up call scheduled for 02/11/24    Encounter Outcome:  Patient Visit Completed

## 2024-01-31 ENCOUNTER — Other Ambulatory Visit: Payer: Self-pay | Admitting: Family Medicine

## 2024-01-31 ENCOUNTER — Other Ambulatory Visit: Payer: Self-pay

## 2024-01-31 NOTE — Patient Instructions (Signed)
 Thank you for allowing the Care Management team to participate in your care.    We will follow up on February 03, 2024 at 4:15pm. Please do not hesitate to contact me if you require assistance prior to our next outreach.     Juanell Fairly [facility] Population Health RN Care Manager Direct Dial: [phone]  Fax: [phone] Website: [email or web address]

## 2024-01-31 NOTE — Patient Outreach (Signed)
 Care Management   Visit Note  01/31/2024 Name: Lawrence [name] MRN: [id number] DOB: 17-Dec-1955  Subjective: Lawrence [name] is a 68 y.o. year old male who is a primary care patient of [name], Demetrios Isaacs, MD. Engaged with Mr. Birr grandson/caregiver Lawrence [name] via telephone today.   Assessment:  Review of patient past medical history, allergies, medications, health status, including review of consultants reports, laboratory and other test data, was performed as part of  evaluation and provision of care management services.   Outpatient Encounter Medications as of 01/31/2024  Medication Sig Note   Accu-Chek Softclix Lancets lancets Use as instructed to check sugar daily for type 2 diabetes.    allopurinol (ZYLOPRIM) 300 MG tablet Take 300 mg by mouth 2 (two) times daily.    ALPRAZolam (XANAX) 1 MG tablet Take 1 tablet (1 mg total) by mouth 3 (three) times daily as needed.    amLODipine (NORVASC) 10 MG tablet Take 10 mg by mouth daily.    apixaban (ELIQUIS) 5 MG TABS tablet Take 1 tablet (5 mg total) by mouth 2 (two) times daily.    atorvastatin (LIPITOR) 80 MG tablet TAKE 1 TABLET BY MOUTH AT BEDTIME    Blood Glucose Calibration (ACCU-CHEK GUIDE CONTROL) LIQD Use with blood glucose monitor as directed    Blood Glucose Monitoring Suppl (ACCU-CHEK GUIDE) w/Device KIT Use to check blood sugars as directed    Budeson-Glycopyrrol-Formoterol (BREZTRI AEROSPHERE) 160-9-4.8 MCG/ACT AERO Inhale 2 puffs into the lungs 2 (two) times daily.    cetirizine (ZYRTEC) 10 MG tablet TAKE 1 TABLET BY MOUTH AT BEDTIME 10/18/2023: Last filled 4D supply in June   Cyanocobalamin (VITAMIN B-12) 1000 MCG SUBL Place 1 tablet under the tongue daily.    dapagliflozin propanediol (FARXIGA) 10 MG TABS tablet TAKE 1 TABLET BY MOUTH DAILY    gabapentin (NEURONTIN) 300 MG capsule Take 1 capsule at bedtime for 1 week, then increase to 1 twice a day    glucose blood (ACCU-CHEK GUIDE) test strip Use as instructed to check sugar  daily for type 2 diabetes.    hydrOXYzine (VISTARIL) 25 MG capsule Take 1 capsule (25 mg total) by mouth 3 (three) times daily as needed.    iron polysaccharides (FERREX 150) 150 MG capsule TAKE 1 CAPSULE BY MOUTH DAILY    isosorbide mononitrate (IMDUR) 60 MG 24 hr tablet TAKE 1 TABLET BY MOUTH DAILY    lidocaine (LIDODERM) 5 % Place 1 patch onto the skin daily. Remove & Discard patch within 12 hours or as directed by MD    metFORMIN (GLUCOPHAGE-XR) 500 MG 24 hr tablet TAKE 1 TABLET BY MOUTH DAILY WITH EVENING MEAL    methocarbamol (ROBAXIN) 500 MG tablet TAKE 1 TABLET BY MOUTH EVERY 8 HOURS AS NEEDED FOR MUSCLE SPASMS    nitroGLYCERIN (NITROSTAT) 0.4 MG SL tablet Place 1 tablet (0.4 mg total) under the tongue every 5 (five) minutes as needed for chest pain (Do not take if blood pressure is low, systolic BP<110).    omega-3 acid ethyl esters (LOVAZA) 1 g capsule TAKE 4 CAPSULES (4 GRAMS TOTAL) BY MOUTHDAILY    oxyCODONE-acetaminophen (PERCOCET) 10-325 MG tablet TAKE 1 TABLET BY MOUTH EVERY 4 HOURS AS NEEDED FOR PAIN.    OXYGEN Inhale 2 L into the lungs at bedtime as needed (for shortness of breath).    pantoprazole (PROTONIX) 40 MG tablet TAKE 1 TABLET BY MOUTH 2 TIMES DAILY ( BEFORE A MEAL)    ranolazine (RANEXA) 1000 MG SR tablet Take 1  tablet (1,000 mg total) by mouth 2 (two) times daily.    sacubitril-valsartan (ENTRESTO) 24-26 MG Take 1 tablet by mouth 2 (two) times daily.    sennosides-docusate sodium (SENOKOT-S) 8.6-50 MG tablet Take 1 tablet by mouth daily.    spironolactone (ALDACTONE) 25 MG tablet Take 25 mg by mouth daily.    sucralfate (CARAFATE) 1 GM/10ML suspension Take 10 mLs (1 g total) by mouth 4 (four) times daily.    traZODone (DESYREL) 100 MG tablet Take 1 tablet (100 mg total) by mouth at bedtime as needed for sleep.    venlafaxine (EFFEXOR) 25 MG tablet Take 1 tablet (25 mg total) by mouth daily.    venlafaxine XR (EFFEXOR-XR) 75 MG 24 hr capsule Take 75 mg by mouth  daily with breakfast.    No facility-administered encounter medications on file as of 01/31/2024.    Interventions:   Goals       Chest Pain      Current Barriers:  Care Management support and education needs related to nonspecific atypical chest pain Unable to complete ADLs and IADLs independently  Planned Interventions: Reviewed current treatment plans. Mr. Bursch continues to require frequent visits to the Emergency Room for nonspecific and atypical chest pain. Per grandson, Mr. Ribeiro was awake and oriented at time of call but experiencing chest pain today without relief. He is pending transport to the Emergency Room.  Lawrence [name] agreed to follow up call this week.   BP Readings from Last 3 Encounters:  01/27/24 (!) 174/107  01/24/24 (!) 160/100  01/22/24 (!) 154/106    Interventions Today    Flowsheet Row Most Recent Value  Chronic Disease   Chronic disease during today's visit Congestive Heart Failure (CHF), Atrial Fibrillation (AFib), Other  [Nonspecified Atypical Chest pain]  General Interventions   General Interventions Discussed/Reviewed General Interventions Reviewed  Doctor Visits Discussed/Reviewed Doctor Visits Reviewed, Specialist  PCP/Specialist Visits Compliance with follow-up visit  [Patient will be evaluated in the Emergency Room today due to unresolved chest pain]  Exercise Interventions   Exercise Discussed/Reviewed Assistive device use and maintanence  Education Interventions   Education Provided Provided Education  Provided Verbal Education On When to see the doctor, Medication  [Grandson/Caregiver confirmed patient is being transported to the Emergency Room today for evaluation of chest pain.]  Pharmacy Interventions   Pharmacy Dicussed/Reviewed Medications and their functions  Safety Interventions   Safety Discussed/Reviewed Safety Reviewed              PLAN Mr. Duhe is being transported to the Emergency Room today. Mr. Lenderman  grandson/caregiver Lawrence [name] agreed to follow up outreach this week    Lawrence [name] [facility] RN Care Manager Direct Dial: [phone]  Fax: [phone] Website: [email or web address]

## 2024-02-01 LAB — HEMOGLOBIN A1C: Hemoglobin A1C: 5.8

## 2024-02-02 ENCOUNTER — Telehealth: Payer: Self-pay

## 2024-02-02 NOTE — Transitions of Care (Post Inpatient/ED Visit) (Signed)
   02/02/2024  Name: Lawrence [name] MRN: [id number] DOB: 1956/04/12  Today's TOC FU Call Status: Today's TOC FU Call Status:: Unsuccessful Call (1st Attempt) Unsuccessful Call (1st Attempt) Date: 02/02/24  Attempted to reach the patient regarding the most recent Inpatient/ED visit.  Follow Up Plan: Additional outreach attempts will be made to reach the patient to complete the Transitions of Care (Post Inpatient/ED visit) call.   Signature Karena Addison, LPN [facility] Nurse Health Advisor Direct Dial [phone]

## 2024-02-03 ENCOUNTER — Other Ambulatory Visit: Payer: Self-pay

## 2024-02-07 NOTE — Transitions of Care (Post Inpatient/ED Visit) (Signed)
 02/07/2024  Name: Lawrence [name] MRN: [id number] DOB: [birth year]  Today's TOC FU Call Status: Today's TOC FU Call Status:: Successful TOC FU Call Completed Unsuccessful Call (1st Attempt) Date: 02/02/24 [facility] FU Call Complete Date: 02/07/24 Patient's Name and Date of Birth confirmed.  Transition Care Management Follow-up Telephone Call Date of Discharge: 02/01/24 Discharge Facility: Other [facility]) Name of Other (Non[facility]) Discharge Facility: [facility] Type of Discharge: Emergency Department Reason for ED Visit: Other: (chest pain) How have you been since you were released from the hospital?: Better Any questions or concerns?: No  Items Reviewed: Did you receive and understand the discharge instructions provided?: Yes Medications obtained,verified, and reconciled?: Yes (Medications Reviewed) Any new allergies since your discharge?: No Dietary orders reviewed?: Yes Do you have support at home?: Yes People in Home: grandchild(ren)  Medications Reviewed Today: Medications Reviewed Today     Reviewed by Karena Addison, LPN (Licensed Practical Nurse) on 02/07/24 at 1618  Med List Status: <None>   Medication Order Taking? Sig Documenting Provider Last Dose Status Informant  Accu-Chek Softclix Lancets lancets [id number] No Use as instructed to check sugar daily for type 2 diabetes. Malva Limes, MD Taking Active Pharmacy Records, Other  allopurinol (ZYLOPRIM) 300 MG tablet [id number] No Take 300 mg by mouth 2 (two) times daily. [provider] Taking Active   ALPRAZolam Prudy Feeler) 1 MG tablet [id number] No Take 1 tablet (1 mg total) by mouth 3 (three) times daily as needed. Malva Limes, MD Taking Active Pharmacy Records, Other  amLODipine (NORVASC) 10 MG tablet [id number] No Take 10 mg by mouth daily. [provider] Taking Active Self  apixaban (ELIQUIS) 5 MG TABS tablet [id number] No Take 1 tablet (5 mg total) by mouth 2 (two) times daily. Malva Limes, MD Taking Active Pharmacy Records, Other  atorvastatin (LIPITOR) 80 MG tablet [id number] No TAKE 1 TABLET BY MOUTH AT BEDTIME Malva Limes, MD Taking Active Pharmacy Records, Other  Blood Glucose Calibration (ACCU-CHEK GUIDE CONTROL) LIQD [id number] No Use with blood glucose monitor as directed Malva Limes, MD Taking Active   Blood Glucose Monitoring Suppl (ACCU-CHEK GUIDE) w/Device KIT [id number] No Use to check blood sugars as directed Malva Limes, MD Taking Active   Budeson-Glycopyrrol-Formoterol (BREZTRI AEROSPHERE) 160-9-4.8 MCG/ACT AERO [id number] No Inhale 2 puffs into the lungs 2 (two) times daily. Malva Limes, MD Taking Active Pharmacy Records, Other  cetirizine (ZYRTEC) 10 MG tablet [id number] No TAKE 1 TABLET BY MOUTH AT BEDTIME Malva Limes, MD Taking Active Pharmacy Records, Other           Med Note Leandrew Koyanagi Oct 18, 2023 10:05 PM) Last filled 4D supply in June  Cyanocobalamin (VITAMIN B-12) 1000 MCG SUBL [id number] No Place 1 tablet under the tongue daily. [provider] Taking Active Pharmacy Records, Other  dapagliflozin propanediol (FARXIGA) 10 MG TABS tablet [id number]  TAKE 1 TABLET BY MOUTH DAILY Bacigalupo, Marzella Schlein, MD  Active   gabapentin (NEURONTIN) 300 MG capsule [id number]  Take 1 capsule at bedtime for 1 week, then increase to 1 twice a day Malva Limes, MD  Active   glucose blood (ACCU-CHEK GUIDE) test strip [id number] No Use as instructed to check sugar daily for type 2 diabetes. Malva Limes, MD Taking Active Pharmacy Records, Other  hydrOXYzine (VISTARIL) 25 MG capsule [id number] No Take 1 capsule (25 mg total) by mouth 3 (three) times daily as needed. Concha Se, MD Taking  Active   iron polysaccharides (FERREX 150) 150 MG capsule [id number] No TAKE 1 CAPSULE BY MOUTH DAILY Malva Limes, MD Taking Active Pharmacy Records, Other  isosorbide mononitrate (IMDUR) 60 MG 24 hr tablet [id number] No TAKE 1 TABLET BY MOUTH  DAILY Malva Limes, MD Taking Active   lidocaine (LIDODERM) 5 % [id number] No Place 1 patch onto the skin daily. Remove & Discard patch within 12 hours or as directed by MD Arnetha Courser, MD Taking Active Pharmacy Records, Other  metFORMIN (GLUCOPHAGE-XR) 500 MG 24 hr tablet [id number]  TAKE 1 TABLET BY MOUTH DAILY WITH EVENING MEAL Malva Limes, MD  Active   methocarbamol (ROBAXIN) 500 MG tablet [id number]  TAKE 1 TABLET BY MOUTH EVERY 8 HOURS AS NEEDED FOR MUSCLE SPASMS Malva Limes, MD  Active   nitroGLYCERIN (NITROSTAT) 0.4 MG SL tablet [id number] No Place 1 tablet (0.4 mg total) under the tongue every 5 (five) minutes as needed for chest pain (Do not take if blood pressure is low, systolic BP<110). Lurene Shadow, MD Taking Active   omega-3 acid ethyl esters (LOVAZA) 1 g capsule [id number] No TAKE 4 CAPSULES (4 GRAMS TOTAL) BY Miachel Roux, MD Taking Active Pharmacy Records, Other  oxyCODONE-acetaminophen (PERCOCET) 10-325 MG tablet [id number]  TAKE 1 TABLET BY MOUTH EVERY 4 HOURS AS NEEDED FOR PAIN. Malva Limes, MD  Active   OXYGEN [id number] No Inhale 2 L into the lungs at bedtime as needed (for shortness of breath). [provider] Taking Active Pharmacy Records, Other  pantoprazole (PROTONIX) 40 MG tablet [id number]  TAKE 1 TABLET BY MOUTH 2 TIMES DAILY ( BEFORE A MEAL) Malva Limes, MD  Active   ranolazine (RANEXA) 1000 MG SR tablet [id number] No Take 1 tablet (1,000 mg total) by mouth 2 (two) times daily. Marcelino Duster, MD Taking Active Pharmacy Records, Other  sacubitril-valsartan [facility]) 24-26 [place] [id number] No Take 1 tablet by mouth 2 (two) times daily. [provider] Taking Active Self  sennosides-docusate sodium (SENOKOT-S) 8.6-50 MG tablet [id number] No Take 1 tablet by mouth daily. [provider] Taking Active Self  spironolactone (ALDACTONE) 25 MG tablet [id number] No Take 25 mg by mouth daily. [provider] Taking Active Self  sucralfate (CARAFATE) 1 GM/10ML suspension [id number] No Take 10 mLs (1 g total) by mouth 4 (four) times daily. Pilar Jarvis, MD Taking Active   traZODone (DESYREL) 100 MG tablet [id number] No Take 1 tablet (100 mg total) by mouth at bedtime as needed for sleep. Malva Limes, MD Taking Active   venlafaxine [facility]) 25 MG tablet [id number]  Take 1 tablet (25 mg total) by mouth daily. Malva Limes, MD  Active   venlafaxine XR (EFFEXOR-XR) 75 MG 24 hr capsule [id number] No Take 75 mg by mouth daily with breakfast. [provider] Taking Active Pharmacy Records, Other            Home Care and Equipment/Supplies: Were Home Health Services Ordered?: NA Any new equipment or medical supplies ordered?: NA  Functional Questionnaire: Do you need assistance with bathing/showering or dressing?: No Do you need assistance with meal preparation?: No Do you need assistance with eating?: No Do you have difficulty maintaining continence: No Do you need assistance with getting out of bed/getting out of a chair/moving?: No Do you have difficulty managing or taking your medications?: No  Follow up appointments reviewed: PCP Follow-up appointment confirmed?: No (declined) MD Provider Line Number:[phone] Given: No Specialist Hospital Follow-up appointment confirmed?: Yes  Date of Specialist follow-up appointment?: 02/11/24 Follow-Up Specialty Provider:: cardio Reason Specialist Follow-Up Not Confirmed: Patient has Specialist Provider Number and will Call for Appointment Do you need transportation to your follow-up appointment?: No Do you understand care options if your condition(s) worsen?: Yes-patient verbalized understanding    SIGNATURE Karena Addison, LPN [facility] Nurse Health Advisor Direct Dial [phone]

## 2024-02-08 ENCOUNTER — Observation Stay
Admission: EM | Admit: 2024-02-08 | Discharge: 2024-02-10 | Disposition: A | Discharge status: Home / Self Care | Attending: Student | Admitting: Student

## 2024-02-08 ENCOUNTER — Other Ambulatory Visit: Payer: Self-pay

## 2024-02-08 ENCOUNTER — Emergency Department

## 2024-02-08 DIAGNOSIS — Z87891 Personal history of nicotine dependence: Secondary | ICD-10-CM | POA: Diagnosis not present

## 2024-02-08 DIAGNOSIS — M109 Gout, unspecified: Secondary | ICD-10-CM | POA: Diagnosis present

## 2024-02-08 DIAGNOSIS — I482 Chronic atrial fibrillation, unspecified: Secondary | ICD-10-CM | POA: Diagnosis not present

## 2024-02-08 DIAGNOSIS — I25118 Atherosclerotic heart disease of native coronary artery with other forms of angina pectoris: Secondary | ICD-10-CM | POA: Insufficient documentation

## 2024-02-08 DIAGNOSIS — Z7984 Long term (current) use of oral hypoglycemic drugs: Secondary | ICD-10-CM | POA: Diagnosis not present

## 2024-02-08 DIAGNOSIS — I110 Hypertensive heart disease with heart failure: Secondary | ICD-10-CM | POA: Insufficient documentation

## 2024-02-08 DIAGNOSIS — Z951 Presence of aortocoronary bypass graft: Secondary | ICD-10-CM | POA: Diagnosis not present

## 2024-02-08 DIAGNOSIS — I5042 Chronic combined systolic (congestive) and diastolic (congestive) heart failure: Secondary | ICD-10-CM | POA: Diagnosis present

## 2024-02-08 DIAGNOSIS — Z955 Presence of coronary angioplasty implant and graft: Secondary | ICD-10-CM | POA: Insufficient documentation

## 2024-02-08 DIAGNOSIS — G4733 Obstructive sleep apnea (adult) (pediatric): Secondary | ICD-10-CM | POA: Diagnosis present

## 2024-02-08 DIAGNOSIS — I10 Essential (primary) hypertension: Secondary | ICD-10-CM | POA: Diagnosis present

## 2024-02-08 DIAGNOSIS — Z79899 Other long term (current) drug therapy: Secondary | ICD-10-CM | POA: Diagnosis not present

## 2024-02-08 DIAGNOSIS — J449 Chronic obstructive pulmonary disease, unspecified: Secondary | ICD-10-CM | POA: Diagnosis present

## 2024-02-08 DIAGNOSIS — Z85828 Personal history of other malignant neoplasm of skin: Secondary | ICD-10-CM | POA: Diagnosis not present

## 2024-02-08 DIAGNOSIS — R079 Chest pain, unspecified: Principal | ICD-10-CM | POA: Diagnosis present

## 2024-02-08 DIAGNOSIS — F418 Other specified anxiety disorders: Secondary | ICD-10-CM | POA: Diagnosis not present

## 2024-02-08 DIAGNOSIS — J441 Chronic obstructive pulmonary disease with (acute) exacerbation: Secondary | ICD-10-CM | POA: Diagnosis present

## 2024-02-08 DIAGNOSIS — I5022 Chronic systolic (congestive) heart failure: Secondary | ICD-10-CM | POA: Diagnosis present

## 2024-02-08 DIAGNOSIS — E119 Type 2 diabetes mellitus without complications: Secondary | ICD-10-CM

## 2024-02-08 DIAGNOSIS — Z7901 Long term (current) use of anticoagulants: Secondary | ICD-10-CM | POA: Insufficient documentation

## 2024-02-08 LAB — CBC
HCT: 50.5 % (ref 39.0–52.0)
Hemoglobin: 15.9 g/dL (ref 13.0–17.0)
MCH: 28.1 pg (ref 26.0–34.0)
MCHC: 31.5 g/dL (ref 30.0–36.0)
MCV: 89.2 fL (ref 80.0–100.0)
Platelets: 238 10*3/uL (ref 150–400)
RBC: 5.66 MIL/uL (ref 4.22–5.81)
RDW: 14 % (ref 11.5–15.5)
WBC: 5.6 10*3/uL (ref 4.0–10.5)
nRBC: 0 % (ref 0.0–0.2)

## 2024-02-08 LAB — TROPONIN I (HIGH SENSITIVITY)
Troponin I (High Sensitivity): 13 ng/L (ref <18)
Troponin I (High Sensitivity): 15 ng/L (ref <18)

## 2024-02-08 LAB — BASIC METABOLIC PANEL
Anion gap: 6 (ref 5–15)
BUN: 14 mg/dL (ref 8–23)
CO2: 31 mmol/L (ref 22–32)
Calcium: 9.4 mg/dL (ref 8.9–10.3)
Chloride: 101 mmol/L (ref 98–111)
Creatinine, Ser: 1.02 mg/dL (ref 0.61–1.24)
GFR, Estimated: >60 mL/min (ref >60)
Glucose, Bld: 128 mg/dL — ABNORMAL HIGH (ref 70–99)
Potassium: 3.7 mmol/L (ref 3.5–5.1)
Sodium: 138 mmol/L (ref 135–145)

## 2024-02-08 MED ORDER — IPRATROPIUM-ALBUTEROL 0.5-2.5 (3) MG/3ML IN SOLN
3.0000 mL | Freq: Once | RESPIRATORY_TRACT | Status: AC
  Administered 2024-02-08: 3 mL via RESPIRATORY_TRACT
  Filled 2024-02-08: qty 3

## 2024-02-08 MED ORDER — MORPHINE SULFATE (PF) 4 MG/ML IV SOLN
8.0000 mg | Freq: Once | INTRAVENOUS | Status: AC
  Administered 2024-02-08: 8 mg via INTRAVENOUS
  Filled 2024-02-08: qty 2

## 2024-02-08 MED ORDER — NITROGLYCERIN 0.4 MG SL SUBL
0.4000 mg | SUBLINGUAL_TABLET | SUBLINGUAL | Status: DC | PRN
  Administered 2024-02-08 (×2): 0.4 mg via SUBLINGUAL
  Filled 2024-02-08 (×2): qty 1

## 2024-02-08 NOTE — ED Triage Notes (Signed)
 Pt comes mid sternal cp that radiates up to left arm. Pt states this started hour ago. Pt states sob bc of his copd.

## 2024-02-08 NOTE — ED Provider Notes (Signed)
   [facility] Provider Note    Event Date/Time   First MD Initiated Contact with Patient 02/08/24 1959     (approximate)   History   Chest Pain   HPI  Lawrence [name] is a 68 y.o. male past medical history significant for chronic chest pain, CAD, hypertension, COPD, who presents to the emergency department with chest pain.     Physical Exam   Triage Vital Signs: ED Triage Vitals  Encounter Vitals Group     BP 02/08/24 1739 (!) 142/108     Systolic BP Percentile --      Diastolic BP Percentile --      Pulse Rate 02/08/24 1739 85     Resp 02/08/24 1739 18     Temp 02/08/24 1739 98 F (36.7 C)     Temp src --      SpO2 02/08/24 1739 92 %     Weight 02/08/24 1748 185 lb (83.9 kg)     Height 02/08/24 1748 5\' 7"  (1.702 m)     Head Circumference --      Peak Flow --      Pain Score 02/08/24 1748 10     Pain Loc --      Pain Education --      Exclude from Growth Chart --     Most recent vital signs: Vitals:   02/08/24 1739  BP: (!) 142/108  Pulse: 85  Resp: 18  Temp: 98 F (36.7 C)  SpO2: 92%    Physical Exam  IMPRESSION / MDM / ASSESSMENT AND PLAN / ED COURSE  I reviewed the triage vital signs and the nursing notes.  *** EKG  I, Corena Herter, the attending physician, personally viewed and interpreted this ECG.   Rate: Normal  Rhythm: Normal sinus  Axis: Normal  Intervals: Normal  ST&T Change: None  No tachycardic or bradycardic dysrhythmias while on cardiac telemetry.  RADIOLOGY I independently reviewed imaging, my interpretation of imaging: ***  LABS (all labs ordered are listed, but only abnormal results are displayed) Labs interpreted as -    Labs Reviewed  BASIC METABOLIC PANEL - Abnormal; Notable for the following components:      Result Value   Glucose, Bld 128 (*)    All other components within normal limits  CBC  TROPONIN I (HIGH SENSITIVITY)  TROPONIN I (HIGH SENSITIVITY)     MDM        PROCEDURES:  Critical Care performed: No  Procedures  Patient's presentation is most consistent with {EM COPA:27473}   MEDICATIONS ORDERED IN ED: Medications - No data to display  FINAL CLINICAL IMPRESSION(S) / ED DIAGNOSES   Final diagnoses:  None     Rx / DC Orders   ED Discharge Orders     None        Note:  This document was prepared using Dragon voice recognition software and may include unintentional dictation errors.

## 2024-02-09 ENCOUNTER — Encounter: Payer: Self-pay | Admitting: Family Medicine

## 2024-02-09 ENCOUNTER — Other Ambulatory Visit: Payer: Self-pay

## 2024-02-09 DIAGNOSIS — I2510 Atherosclerotic heart disease of native coronary artery without angina pectoris: Secondary | ICD-10-CM

## 2024-02-09 DIAGNOSIS — I10 Essential (primary) hypertension: Secondary | ICD-10-CM | POA: Diagnosis not present

## 2024-02-09 DIAGNOSIS — R079 Chest pain, unspecified: Secondary | ICD-10-CM

## 2024-02-09 DIAGNOSIS — I5030 Unspecified diastolic (congestive) heart failure: Secondary | ICD-10-CM

## 2024-02-09 DIAGNOSIS — I482 Chronic atrial fibrillation, unspecified: Secondary | ICD-10-CM

## 2024-02-09 DIAGNOSIS — E1169 Type 2 diabetes mellitus with other specified complication: Secondary | ICD-10-CM

## 2024-02-09 LAB — CBG MONITORING, ED
Glucose-Capillary: 107 mg/dL — ABNORMAL HIGH (ref 70–99)
Glucose-Capillary: 102 mg/dL — ABNORMAL HIGH (ref 70–99)
Glucose-Capillary: 92 mg/dL (ref 70–99)
Glucose-Capillary: 89 mg/dL (ref 70–99)

## 2024-02-09 MED ORDER — OMEGA-3-ACID ETHYL ESTERS 1 G PO CAPS
1.0000 g | ORAL_CAPSULE | Freq: Every day | ORAL | Status: DC
  Administered 2024-02-09 – 2024-02-10 (×2): 1 g via ORAL
  Filled 2024-02-09 (×2): qty 1

## 2024-02-09 MED ORDER — ALPRAZOLAM 0.5 MG PO TABS
1.0000 mg | ORAL_TABLET | Freq: Three times a day (TID) | ORAL | Status: DC | PRN
  Administered 2024-02-09: 1 mg via ORAL
  Filled 2024-02-09: qty 2

## 2024-02-09 MED ORDER — ISOSORBIDE MONONITRATE ER 60 MG PO TB24
120.0000 mg | ORAL_TABLET | Freq: Every day | ORAL | Status: DC
  Administered 2024-02-10: 120 mg via ORAL
  Filled 2024-02-09: qty 2

## 2024-02-09 MED ORDER — LORATADINE 10 MG PO TABS
10.0000 mg | ORAL_TABLET | Freq: Every day | ORAL | Status: DC
  Administered 2024-02-09 – 2024-02-10 (×2): 10 mg via ORAL
  Filled 2024-02-09 (×2): qty 1

## 2024-02-09 MED ORDER — SACUBITRIL-VALSARTAN 24-26 MG PO TABS
1.0000 | ORAL_TABLET | Freq: Two times a day (BID) | ORAL | Status: DC
  Administered 2024-02-09 – 2024-02-10 (×3): 1 via ORAL
  Filled 2024-02-09 (×4): qty 1

## 2024-02-09 MED ORDER — ISOSORBIDE MONONITRATE ER 60 MG PO TB24
60.0000 mg | ORAL_TABLET | Freq: Once | ORAL | Status: AC
  Administered 2024-02-09: 60 mg via ORAL
  Filled 2024-02-09: qty 1

## 2024-02-09 MED ORDER — GABAPENTIN 300 MG PO CAPS
300.0000 mg | ORAL_CAPSULE | Freq: Two times a day (BID) | ORAL | Status: DC
  Administered 2024-02-09 – 2024-02-10 (×4): 300 mg via ORAL
  Filled 2024-02-09 (×4): qty 1

## 2024-02-09 MED ORDER — ALLOPURINOL 300 MG PO TABS
300.0000 mg | ORAL_TABLET | Freq: Two times a day (BID) | ORAL | Status: DC
  Administered 2024-02-09 – 2024-02-10 (×3): 300 mg via ORAL
  Filled 2024-02-09 (×4): qty 1

## 2024-02-09 MED ORDER — AMLODIPINE BESYLATE 5 MG PO TABS
10.0000 mg | ORAL_TABLET | Freq: Every day | ORAL | Status: DC
Start: 2024-02-09 — End: 2024-02-10
  Administered 2024-02-09: 10 mg via ORAL
  Filled 2024-02-09: qty 2

## 2024-02-09 MED ORDER — SODIUM CHLORIDE 0.9 % IV SOLN: INTRAVENOUS | Status: AC

## 2024-02-09 MED ORDER — TRAZODONE HCL 100 MG PO TABS: 100.0000 mg | ORAL_TABLET | Freq: Every evening | ORAL | Status: DC | PRN

## 2024-02-09 MED ORDER — ACETAMINOPHEN 325 MG PO TABS: 650.0000 mg | ORAL_TABLET | ORAL | Status: DC | PRN

## 2024-02-09 MED ORDER — NITROGLYCERIN 0.4 MG SL SUBL: 0.4000 mg | SUBLINGUAL_TABLET | SUBLINGUAL | Status: DC | PRN

## 2024-02-09 MED ORDER — HYDROMORPHONE HCL 1 MG/ML IJ SOLN
1.5000 mg | INTRAMUSCULAR | Status: DC | PRN
  Administered 2024-02-09 – 2024-02-10 (×5): 1.5 mg via INTRAVENOUS
  Filled 2024-02-09 (×6): qty 1.5

## 2024-02-09 MED ORDER — OXYCODONE HCL 5 MG PO TABS
5.0000 mg | ORAL_TABLET | ORAL | Status: DC | PRN
  Administered 2024-02-09: 5 mg via ORAL
  Filled 2024-02-09: qty 1

## 2024-02-09 MED ORDER — OXYCODONE-ACETAMINOPHEN 10-325 MG PO TABS: 1.0000 | ORAL_TABLET | ORAL | Status: DC | PRN

## 2024-02-09 MED ORDER — INSULIN ASPART 100 UNIT/ML IJ SOLN
0.0000 [IU] | Freq: Three times a day (TID) | INTRAMUSCULAR | Status: DC
  Administered 2024-02-10: 2 [IU] via SUBCUTANEOUS
  Filled 2024-02-09: qty 1

## 2024-02-09 MED ORDER — ONDANSETRON HCL 4 MG/2ML IJ SOLN
4.0000 mg | Freq: Four times a day (QID) | INTRAMUSCULAR | Status: DC | PRN
  Administered 2024-02-09 – 2024-02-10 (×3): 4 mg via INTRAVENOUS
  Filled 2024-02-09 (×3): qty 2

## 2024-02-09 MED ORDER — APIXABAN 5 MG PO TABS
5.0000 mg | ORAL_TABLET | Freq: Two times a day (BID) | ORAL | Status: DC
  Administered 2024-02-09 – 2024-02-10 (×4): 5 mg via ORAL
  Filled 2024-02-09 (×4): qty 1

## 2024-02-09 MED ORDER — METHOCARBAMOL 500 MG PO TABS: 500.0000 mg | ORAL_TABLET | Freq: Three times a day (TID) | ORAL | Status: DC | PRN

## 2024-02-09 MED ORDER — SUCRALFATE 1 GM/10ML PO SUSP
1.0000 g | Freq: Four times a day (QID) | ORAL | Status: DC
  Administered 2024-02-09 – 2024-02-10 (×6): 1 g via ORAL
  Filled 2024-02-09 (×8): qty 10

## 2024-02-09 MED ORDER — RANOLAZINE ER 500 MG PO TB12
1000.0000 mg | ORAL_TABLET | Freq: Two times a day (BID) | ORAL | Status: DC
  Administered 2024-02-09 – 2024-02-10 (×3): 1000 mg via ORAL
  Filled 2024-02-09 (×4): qty 2

## 2024-02-09 MED ORDER — POLYSACCHARIDE IRON COMPLEX 150 MG PO CAPS
150.0000 mg | ORAL_CAPSULE | Freq: Every day | ORAL | Status: DC
  Administered 2024-02-09 – 2024-02-10 (×2): 150 mg via ORAL
  Filled 2024-02-09 (×2): qty 1

## 2024-02-09 MED ORDER — SPIRONOLACTONE 25 MG PO TABS: 25.0000 mg | ORAL_TABLET | Freq: Every day | ORAL | Status: DC

## 2024-02-09 MED ORDER — HYDROMORPHONE HCL 1 MG/ML IJ SOLN
1.0000 mg | INTRAMUSCULAR | Status: DC | PRN
  Administered 2024-02-09 (×3): 1 mg via INTRAVENOUS
  Filled 2024-02-09 (×3): qty 1

## 2024-02-09 MED ORDER — HYDROXYZINE HCL 25 MG PO TABS: 25.0000 mg | ORAL_TABLET | Freq: Three times a day (TID) | ORAL | Status: DC | PRN

## 2024-02-09 MED ORDER — DAPAGLIFLOZIN PROPANEDIOL 10 MG PO TABS
10.0000 mg | ORAL_TABLET | Freq: Every day | ORAL | Status: DC
  Administered 2024-02-09 – 2024-02-10 (×2): 10 mg via ORAL
  Filled 2024-02-09 (×2): qty 1

## 2024-02-09 MED ORDER — OXYCODONE-ACETAMINOPHEN 5-325 MG PO TABS
1.0000 | ORAL_TABLET | ORAL | Status: DC | PRN
  Administered 2024-02-09: 1 via ORAL
  Filled 2024-02-09: qty 1

## 2024-02-09 MED ORDER — PANTOPRAZOLE SODIUM 40 MG PO TBEC
40.0000 mg | DELAYED_RELEASE_TABLET | Freq: Two times a day (BID) | ORAL | Status: DC
  Administered 2024-02-09 – 2024-02-10 (×4): 40 mg via ORAL
  Filled 2024-02-09 (×4): qty 1

## 2024-02-09 MED ORDER — ISOSORBIDE MONONITRATE ER 60 MG PO TB24
60.0000 mg | ORAL_TABLET | Freq: Every day | ORAL | Status: DC
  Administered 2024-02-09: 60 mg via ORAL
  Filled 2024-02-09: qty 1

## 2024-02-09 MED ORDER — CYANOCOBALAMIN 500 MCG PO TABS
1000.0000 ug | ORAL_TABLET | Freq: Every day | ORAL | Status: DC
  Administered 2024-02-09 – 2024-02-10 (×2): 1000 ug via ORAL
  Filled 2024-02-09 (×2): qty 2

## 2024-02-09 MED ORDER — ATORVASTATIN CALCIUM 20 MG PO TABS
80.0000 mg | ORAL_TABLET | Freq: Every day | ORAL | Status: DC
  Administered 2024-02-09 (×2): 80 mg via ORAL
  Filled 2024-02-09 (×2): qty 4

## 2024-02-09 MED ORDER — SPIRONOLACTONE 12.5 MG HALF TABLET
12.5000 mg | ORAL_TABLET | Freq: Every day | ORAL | Status: DC
  Administered 2024-02-09: 12.5 mg via ORAL
  Filled 2024-02-09 (×2): qty 1

## 2024-02-09 MED ORDER — SENNOSIDES-DOCUSATE SODIUM 8.6-50 MG PO TABS
1.0000 | ORAL_TABLET | Freq: Every day | ORAL | Status: DC
  Administered 2024-02-09 – 2024-02-10 (×2): 1 via ORAL
  Filled 2024-02-09 (×2): qty 1

## 2024-02-09 MED ORDER — VENLAFAXINE HCL ER 75 MG PO CP24
75.0000 mg | ORAL_CAPSULE | Freq: Every day | ORAL | Status: DC
  Administered 2024-02-09 – 2024-02-10 (×2): 75 mg via ORAL
  Filled 2024-02-09 (×2): qty 1

## 2024-02-09 NOTE — Consult Note (Signed)
 [facility] CLINIC CARDIOLOGY CONSULT NOTE       Patient ID: Lawrence [name] MRN: [id number] DOB/AGE: 1955/12/14 68 y.o.  Admit date: 02/08/2024 Referring Physician Dr. Doree Albee Primary Physician Sherrie Mustache, Demetrios Isaacs, MD  Primary Cardiologist Dr. Massie Maroon [facility]) Reason for Consultation chest pain  HPI: Lawrence [name] is a 69 y.o. male  with a past medical history of CAD (h/o STEMI s/p CABG x2 LIMA-LAD and SVG-OM1 2002 c/b coronary artery dissection with multiple subsequent PCI and stents), chronic stable angina, HFpEF (50-55%, mild LVH 10/2022) paroxysmal atrial fibrillation s/p unsuccessful DCCV 06/29/2022, COPD (PRN 2L), hx tobacco use, DM2  who presented to the ED on 02/08/2024 for chest pain. Cardiology was consulted for further evaluation.   Patient reports that yesterday he had onset of chest pain.  Reports this is centrally located in radiates up into his left chest and left elbow.  Also reports associated elbow numbness.  States he previously episodes typically would last hours to days.  He reports that this episode has been persistent since yesterday and he decided to come to the ED for further evaluation.  Workup in the ED notable for creatinine 1.02, potassium 3.7, hemoglobin 15.9, WBC 5.6.  Troponins trended 13 > 15.  EKG nonacute.  Chest x-ray without acute abnormality.  At the time of my evaluation this afternoon he is resting comfortably in hospital bed.  We discussed his symptoms in further detail.  He has a long history of chronic angina but reports that his pain has been worse for the last 1 to 2 days.  We discussed his recent heart catheterization and stress test late last year.  Also reviewed recent echo results which demonstrated preserved EF.  He denies any associated shortness of breath, cough, dizziness, lightheadedness, syncope.  Review of systems complete and found to be negative unless listed above    Past Medical History:  Diagnosis Date   A-fib (HCC)    Anemia     Anginal pain (HCC)    Anxiety    Arthritis    Asthma    CAD (coronary artery disease)    a. 2002 CABGx2 (LIMA->LAD, VG->VG->OM1);  b. 09/2012 DES->OM;  c. 03/2015 PTCA of LAD [facility]) in setting of atretic LIMA; d. 05/2015 Cath [facility]): nonobs dzs; e. 06/2015 Cath ([facility]): LM nl, LAD 45p/d ISR, 50d, D1/2 small, LCX 50p/d ISR, OM1 70ost, 30 ISR, VG->OM1 50ost, 64m, LIMA->LAD 99p/d - atretic, RCA dom, nl; f.cath 10/16: 40-50%(FFR 0.90) pLAD, 75% (FFR 0.77) mLAD s/p PCI/DES, oRCA 40% (FFR0.95)   Cancer (HCC)    SKIN CANCER ON BACK   Celiac disease    Chronic diastolic CHF (congestive heart failure) (HCC)    a. 06/2009 Echo: EF 60-65%, Gr 1 DD, triv AI, mildly dil LA, nl RV.   COPD (chronic obstructive pulmonary disease) (HCC)    a. Chronic bronchitis and emphysema.   DDD (degenerative disc disease), lumbar    Diverticulosis    Dysrhythmia    Essential hypertension    GERD (gastroesophageal reflux disease)    History of hiatal hernia    History of kidney stones    H/O   History of tobacco abuse    a. Quit 2014.   Myocardial infarction [facility]) 2002   4 STENTS   Pancreatitis    PSVT (paroxysmal supraventricular tachycardia) (HCC)    a. 10/2012 Noted on [facility]io Patch.   Sleep apnea    LOST CORD TO CPAP -ONLY 02 @ BEDTIME   Tubular adenoma of  colon    Type II diabetes mellitus (HCC)     Past Surgical History:  Procedure Laterality Date   BYPASS GRAFT     CARDIAC CATHETERIZATION N/A 07/12/2015   rocedure: Left Heart Cath and Cors/Grafts Angiography;  Surgeon: Lyn Records, MD;  Location: [facility] INVASIVE CV LAB;  Service: Cardiovascular;  Laterality: N/A;   CARDIAC CATHETERIZATION Right 10/07/2015   Procedure: Left Heart Cath and Cors/Grafts Angiography;  Surgeon: Laurier Nancy, MD;  Location: [facility] INVASIVE CV LAB;  Service: Cardiovascular;  Laterality: Right;   CARDIAC CATHETERIZATION N/A 04/06/2016   Procedure: Left Heart Cath and Coronary Angiography;  Surgeon: Alwyn Pea, MD;  Location: [facility]  INVASIVE CV LAB;  Service: Cardiovascular;  Laterality: N/A;   CARDIAC CATHETERIZATION  04/06/2016   Procedure: Bypass Graft Angiography;  Surgeon: Alwyn Pea, MD;  Location: [facility] INVASIVE CV LAB;  Service: Cardiovascular;;   CARDIAC CATHETERIZATION N/A 11/02/2016   Procedure: Left Heart Cath and Cors/Grafts Angiography and possible PCI;  Surgeon: Alwyn Pea, MD;  Location: [facility] INVASIVE CV LAB;  Service: Cardiovascular;  Laterality: N/A;   CARDIAC CATHETERIZATION N/A 11/02/2016   Procedure: Coronary Stent Intervention;  Surgeon: Alwyn Pea, MD;  Location: [facility] INVASIVE CV LAB;  Service: Cardiovascular;  Laterality: N/A;   CARDIOVERSION N/A 06/29/2022   Procedure: CARDIOVERSION;  Surgeon: Lamar Blinks, MD;  Location: [facility] ORS;  Service: Cardiovascular;  Laterality: N/A;   CHOLECYSTECTOMY     CIRCUMCISION N/A 06/09/2019   Procedure: CIRCUMCISION ADULT;  Surgeon: Sondra Come, MD;  Location: [facility] ORS;  Service: Urology;  Laterality: N/A;   COLONOSCOPY WITH PROPOFOL N/A 04/01/2018   Procedure: COLONOSCOPY WITH PROPOFOL;  Surgeon: Scot Jun, MD;  Location: [facility] ENDOSCOPY;  Service: Endoscopy;  Laterality: N/A;   COLONOSCOPY WITH PROPOFOL N/A 05/01/2023   Procedure: COLONOSCOPY WITH PROPOFOL;  Surgeon: Toney Reil, MD;  Location: [facility] ENDOSCOPY;  Service: Gastroenterology;  Laterality: N/A;   ESOPHAGEAL DILATION     ESOPHAGOGASTRODUODENOSCOPY (EGD) WITH PROPOFOL N/A 04/01/2018   Procedure: ESOPHAGOGASTRODUODENOSCOPY (EGD) WITH PROPOFOL;  Surgeon: Scot Jun, MD;  Location: [facility] ENDOSCOPY;  Service: Endoscopy;  Laterality: N/A;   ESOPHAGOGASTRODUODENOSCOPY (EGD) WITH PROPOFOL N/A 05/01/2023   Procedure: ESOPHAGOGASTRODUODENOSCOPY (EGD) WITH PROPOFOL;  Surgeon: Toney Reil, MD;  Location: [facility] ENDOSCOPY;  Service: Gastroenterology;  Laterality: N/A;   GIVENS CAPSULE STUDY  05/01/2023   Procedure: GIVENS CAPSULE STUDY;  Surgeon: Toney Reil,  MD;  Location: [facility] ENDOSCOPY;  Service: Gastroenterology;;   LEFT HEART CATH AND CORS/GRAFTS ANGIOGRAPHY N/A 06/12/2019   Procedure: LEFT HEART CATH AND CORS/GRAFTS ANGIOGRAPHY;  Surgeon: Dalia Heading, MD;  Location: [facility] INVASIVE CV LAB;  Service: Cardiovascular;  Laterality: N/A;   LEFT HEART CATH AND CORS/GRAFTS ANGIOGRAPHY N/A 03/11/2020   Procedure: LEFT HEART CATH AND CORS/GRAFTS ANGIOGRAPHY;  Surgeon: Marcina Millard, MD;  Location: [facility] INVASIVE CV LAB;  Service: Cardiovascular;  Laterality: N/A;   LEFT HEART CATH AND CORS/GRAFTS ANGIOGRAPHY N/A 05/01/2021   Procedure: LEFT HEART CATH AND CORS/GRAFTS ANGIOGRAPHY;  Surgeon: Lamar Blinks, MD;  Location: [facility] INVASIVE CV LAB;  Service: Cardiovascular;  Laterality: N/A;   LEFT HEART CATH AND CORS/GRAFTS ANGIOGRAPHY N/A 11/02/2022   Procedure: LEFT HEART CATH AND CORS/GRAFTS ANGIOGRAPHY;  Surgeon: Alwyn Pea, MD;  Location: [facility] INVASIVE CV LAB;  Service: Cardiovascular;  Laterality: N/A;   TONSILLECTOMY     VASCULAR SURGERY      (Not in a hospital admission)  Social History   Socioeconomic  History   Marital status: Widowed    Spouse name: Not on file   Number of children: 1   Years of education: Not on file   Highest education level: 10th grade  Occupational History   Occupation: Disabled   Occupation: on Tree surgeon  Tobacco Use   Smoking status: Former    Current packs/day: 0.00    Average packs/day: 3.0 packs/day for 50.0 years (150.0 ttl pk-yrs)    Types: Cigarettes    Start date: 04/23/1963    Quit date: 04/22/2013    Years since quitting: 10.8   Smokeless tobacco: Never   Tobacco comments:    Reports not smoking for approx 8 years.  Vaping Use   Vaping status: Never Used  Substance and Sexual Activity   Alcohol use: No    Comment: remotely quit alcohol use. Hx of heavy alcohol use.   Drug use: Not Currently    Types: Marijuana   Sexual activity: Not Currently  Other Topics Concern   Not on  file  Social History Narrative   Pt lives in [place] with wife.  Does not routinely exercise.   Social Drivers of Corporate investment banker Strain: Low Risk  (01/13/2024)   Overall Financial Resource Strain (CARDIA)    Difficulty of Paying Living Expenses: Not very hard  Food Insecurity: Food Insecurity Present (02/09/2024)   Hunger Vital Sign    Worried About Running Out of Food in the Last Year: Sometimes true    Ran Out of Food in the Last Year: Sometimes true  Transportation Needs: No Transportation Needs (02/09/2024)   PRAPARE - Administrator, Civil Service (Medical): No    Lack of Transportation (Non-Medical): No  Physical Activity: Inactive (01/13/2024)   Exercise Vital Sign    Days of Exercise per Week: 0 days    Minutes of Exercise per Session: 0 min  Stress: Stress Concern Present (01/13/2024)   [employer] of Occupational Health - Occupational Stress Questionnaire    Feeling of Stress : To some extent  Social Connections: Moderately Isolated (02/09/2024)   Social Connection and Isolation Panel [NHANES]    Frequency of Communication with Friends and Family: More than three times a week    Frequency of Social Gatherings with Friends and Family: More than three times a week    Attends Religious Services: 1 to 4 times per year    Active Member of [employer] or Organizations: No    Attends Banker Meetings: Never    Marital Status: Widowed  Intimate Partner Violence: Not At Risk (02/09/2024)   Humiliation, Afraid, Rape, and Kick questionnaire    Fear of Current or Ex-Partner: No    Emotionally Abused: No    Physically Abused: No    Sexually Abused: No    Family History  Problem Relation Age of Onset   Heart attack Mother    Depression Mother    Heart disease Mother    COPD Mother    Hypertension Mother    Heart attack Father    Diabetes Father    Depression Father    Heart disease Father    Cirrhosis Father    Parkinson's disease  Brother      Vitals:   02/09/24 0815 02/09/24 0830 02/09/24 1100 02/09/24 1155  BP:  115/69 128/73   Pulse: 82 86 70   Resp: 16 12 13    Temp:    (!) 96.7 F (35.9 C)  TempSrc:    Axillary  SpO2: (!) 88% 90% 100%   Weight:      Height:        PHYSICAL EXAM General: Chronically ill-appearing elderly male, well nourished, in no acute distress. HEENT: Normocephalic and atraumatic. Neck: No JVD.  Lungs: Normal respiratory effort on 2L [place].  Diminished bilaterally Heart: Regularly irregular, controlled rate. Normal S1 and S2 without gallops or murmurs.  Abdomen: Non-distended appearing.  Msk: Normal strength and tone for age. Extremities: Warm and well perfused. No clubbing, cyanosis.  No edema.  Neuro: Alert and oriented X 3. Psych: Answers questions appropriately.   Labs: Basic Metabolic Panel: Recent Labs    02/08/24 1859  NA 138  K 3.7  CL 101  CO2 31  GLUCOSE 128*  BUN 14  CREATININE 1.02  CALCIUM 9.4   Liver Function Tests: No results for input(s): "AST", "ALT", "ALKPHOS", "BILITOT", "PROT", "ALBUMIN" in the last 72 hours. No results for input(s): "LIPASE", "AMYLASE" in the last 72 hours. CBC: Recent Labs    02/08/24 1736  WBC 5.6  HGB 15.9  HCT 50.5  MCV 89.2  PLT 238   Cardiac Enzymes: Recent Labs    02/08/24 1736 02/08/24 2014  TROPONINIHS 13 15   BNP: No results for input(s): "BNP" in the last 72 hours. D-Dimer: No results for input(s): "DDIMER" in the last 72 hours. Hemoglobin A1C: No results for input(s): "HGBA1C" in the last 72 hours. Fasting Lipid Panel: No results for input(s): "CHOL", "HDL", "LDLCALC", "TRIG", "CHOLHDL", "LDLDIRECT" in the last 72 hours. Thyroid Function Tests: No results for input(s): "TSH", "T4TOTAL", "T3FREE", "THYROIDAB" in the last 72 hours.  Invalid input(s): "FREET3" Anemia Panel: No results for input(s): "VITAMINB12", "FOLATE", "FERRITIN", "TIBC", "IRON", "RETICCTPCT" in the last 72 hours.   Radiology: DG  Chest 2 View Result Date: 02/08/2024 CLINICAL DATA:  Chest pain. EXAM: CHEST - 2 VIEW COMPARISON:  January 26, 2024. FINDINGS: The heart size and mediastinal contours are within normal limits. Sternotomy wires are noted. Both lungs are clear. The visualized skeletal structures are unremarkable. IMPRESSION: No active cardiopulmonary disease. Electronically Signed   By: Lupita Raider M.D.   On: 02/08/2024 18:11   DG Chest 1 View Result Date: 01/27/2024 CLINICAL DATA:  Chest pain. EXAM: CHEST  1 VIEW COMPARISON:  01/21/2024.  Chest CT 01/22/2024 FINDINGS: The cardiomediastinal contours are stable. Previous median sternotomy and CABG. Pulmonary vasculature is normal. No consolidation, pleural effusion, or pneumothorax. No acute osseous abnormalities are seen. IMPRESSION: No active disease. Electronically Signed   By: Narda Rutherford M.D.   On: 01/27/2024 00:01   CT Angio Chest/Abd/Pel for Dissection W and/or Wo Contrast Result Date: 01/22/2024 CLINICAL DATA:  Acute aortic syndrome suspected. EXAM: CT ANGIOGRAPHY CHEST, ABDOMEN AND PELVIS TECHNIQUE: Non-contrast CT of the chest was initially obtained. Multidetector CT imaging through the chest, abdomen and pelvis was performed using the standard protocol during bolus administration of intravenous contrast. Multiplanar reconstructed images and MIPs were obtained and reviewed to evaluate the vascular anatomy. RADIATION DOSE REDUCTION: This exam was performed according to the departmental dose-optimization program which includes automated exposure control, adjustment of the mA and/or kV according to patient size and/or use of iterative reconstruction technique. CONTRAST:  OMNIPAQUE IOHEXOL 350 MG/ML SOLN COMPARISON:  CT angiogram chest abdomen and pelvis 10/02/2023. FINDINGS: CTA CHEST FINDINGS Cardiovascular: Preferential opacification of the thoracic aorta. No evidence of thoracic aortic aneurysm or dissection. Normal heart size. No pericardial effusion.  There is moderate calcified atherosclerotic disease throughout the aorta. Patient is  status post cardiac surgery. Mediastinum/Nodes: No enlarged mediastinal, hilar, or axillary lymph nodes. Thyroid gland, trachea, and esophagus demonstrate no significant findings. Lungs/Pleura: 8 mm right middle lobe nodular density has decreased in size now measuring 5 mm. There is a new 5 mm right lower lobe nodule image 6/38. There is no focal lung infiltrate, pleural effusion or pneumothorax. Musculoskeletal: Degenerative changes affect the spine. Sternotomy wires are present. Review of the MIP images confirms the above findings. CTA ABDOMEN AND PELVIS FINDINGS VASCULAR Aorta: Normal caliber aorta without aneurysm, dissection, vasculitis or significant stenosis. There is moderate calcified atherosclerotic disease throughout the aorta. Celiac: Patent without evidence of aneurysm, dissection, vasculitis or significant stenosis. SMA: Patent without evidence of aneurysm, dissection, vasculitis or significant stenosis. Renals: Both renal arteries are patent without evidence of aneurysm, dissection, vasculitis, fibromuscular dysplasia or significant stenosis. IMA: Patent without evidence of aneurysm, dissection, vasculitis or significant stenosis. Inflow: Patent without evidence of aneurysm, dissection, vasculitis or significant stenosis. Veins: No obvious venous abnormality within the limitations of this arterial phase study. Review of the MIP images confirms the above findings. NON-VASCULAR Hepatobiliary: No focal liver abnormality is seen. Status post cholecystectomy. No biliary dilatation. Pancreas: Unremarkable. No pancreatic ductal dilatation or surrounding inflammatory changes. Spleen: Normal in size without focal abnormality. Adrenals/Urinary Tract: Adrenal glands are unremarkable. Kidneys are normal, without renal calculi, focal lesion, or hydronephrosis. Bladder is unremarkable. Stomach/Bowel: Stomach is within normal  limits. Appendix appears normal. No evidence of bowel wall thickening, distention, or inflammatory changes. Lymphatic: No enlarged lymph nodes are seen. Reproductive: Prostate is unremarkable. Other: No abdominal wall hernia or abnormality. No abdominopelvic ascites. Musculoskeletal: No acute or significant osseous findings. Review of the MIP images confirms the above findings. IMPRESSION: 1. No evidence for aortic aneurysm or dissection. 2. No acute localizing process in the chest, abdomen or pelvis. 3. New 5 mm right lower lobe pulmonary nodule. No follow-up needed if patient is low-risk.This recommendation follows the consensus statement: Guidelines for Management of Incidental Pulmonary Nodules Detected on CT Images: From the Fleischner Society 2017; Radiology 2017; 284:228-243. 4. Aortic atherosclerosis. Aortic Atherosclerosis (ICD10-I70.0). Electronically Signed   By: Darliss Cheney M.D.   On: 01/22/2024 02:20   DG Chest 2 View Result Date: 01/22/2024 CLINICAL DATA:  Chest pain EXAM: CHEST - 2 VIEW COMPARISON:  01/11/2024 FINDINGS: Sternotomy. No acute airspace disease or effusion. Normal cardiomediastinal silhouette with aortic atherosclerosis. Multilevel degenerative changes of the spine IMPRESSION: No active cardiopulmonary disease. Electronically Signed   By: Jasmine Pang M.D.   On: 01/22/2024 00:45   DG Chest Portable 1 View Result Date: 01/11/2024 CLINICAL DATA:  Chest pain EXAM: PORTABLE CHEST 1 VIEW COMPARISON:  12/26/2023 FINDINGS: Prior CABG. Heart and mediastinal contours are within normal limits. No focal opacities or effusions. No acute bony abnormality. Aortic atherosclerosis. IMPRESSION: No active cardiopulmonary disease. Electronically Signed   By: Charlett Nose M.D.   On: 01/11/2024 01:20    LHC 08/2023 at [facility]: CONCLUSIONS:  Complex coronary artery disease. LM normal; LAD with moderate diffuse  disease and patent stent; 50% ostial LCX with competitive flow in OM1; RCA  with mild  diffuse disease.  1/2 bypass grafts open: LIMA to LAD atretic, SVG to OM1 with moderate  stenosis in the first portion of the graft body, similar to previous.  Elevated LVEDP = 24 mmHg.   Nuclear stress 10/2023 at [facility]: IMPRESSIONS:  -Abnormal myocardial perfusion study  -Fixed decreased counts in the apex, anteroapical wall, and inferoapical wall, most consistent with  scar.  - Left ventricular systolic function is abnormal. Post stress the ejection fraction is 35%. The left ventricle is normal in size.  -Dense atherosclerotic calcifications in the coronary arteries. Changes of prior coronary artery bypass. Atherosclerotic calcifications in the thoracic aorta. Status post cholecystectomy.   ECHO 02/01/2024 at [facility]: Summary   1. Technically difficult study.    2. The left ventricle is normal in size with mildly increased wall  thickness.   3. The left ventricular ejection fraction was quantified (MOD bi-plane) at  57 %.    4. The right ventricle is normal in size, with probably normal systolic  function.   5. The left atrium is mildly dilated in size.    6. There is no pericardial effusion.    7. Compared to study dated 11/15/23, there is no significant change.   TELEMETRY reviewed by me 02/09/2024: atrial fibrillation rate 70s  EKG reviewed by me: Atrial fibrillation rate 70s  Data reviewed by me 02/09/2024: last 24h vitals tele labs imaging I/O ED provider note, admission H&P  Active Problems:   Depression with anxiety   OSA (obstructive sleep apnea)   Chronic combined systolic (congestive) and diastolic (congestive) heart failure (HCC)   Gout   Essential hypertension   Diabetes mellitus without complication (HCC)   Chest pain   COPD (chronic obstructive pulmonary disease) (HCC)   Atrial fibrillation, chronic (HCC)    ASSESSMENT AND PLAN:  Lawrence [name] is a 68 y.o. male  with a past medical history of CAD (h/o STEMI s/p CABG x2 LIMA-LAD and SVG-OM1 2002 c/b coronary artery  dissection with multiple subsequent PCI and stents), chronic stable angina, HFpEF (50-55%, mild LVH 10/2022) paroxysmal atrial fibrillation s/p unsuccessful DCCV 06/29/2022, COPD (PRN 2L), hx tobacco use, DM2  who presented to the ED on 02/08/2024 for chest pain. Cardiology was consulted for further evaluation.   # Chest pain # CAD s/p CABG with chronic angina # Chronic HFrEF, recovered Patient with 1 day of CP which has been persistent.  Opponents normal x 2 at 13, 15. EKG nonacute. -Increase Imdur to 120 mg daily.  He will receive an additional 60 mg dose today.  Continue home ranexa 1000 mg bid. -Continue home atorvastatin 80 mg daily and plavix 75 mg daily. -Continue Entresto 24-26 mg twice daily, spironolactone 12.5 mg daily, Farxiga 10 mg daily. -No plan for further cardiac diagnostics at this time.  # Persistent atrial fibrillation Rates controlled on telemetry. -Continue Eliquis 5 mg twice daily for stroke risk reduction. -Not currently on any rate control medications due to history of bradycardia.   This patient's plan of care was discussed and created with Dr. Corky Sing and he is in agreement.  Signed: Gale Journey, PA-C  02/09/2024, 12:58 PM [facility]

## 2024-02-09 NOTE — Assessment & Plan Note (Signed)
 Stable from a respiratory standpoint Continue home inhalers

## 2024-02-09 NOTE — Assessment & Plan Note (Signed)
 Stable Continue medications including Effexor

## 2024-02-09 NOTE — ED Notes (Signed)
 Reached out to [facility] MD who increased Dilaudid per pt request as he reported his pain only improving at the best a 5/10 after being given doses of pain meds each time. Pt has not appeared to be in any distress and has been able to fall asleep but still c/o pain not being managed. Spoke with some rounding hospital providers earlier in person regarding concern over his "loopiness" being under influence of meds,  and his consistent request for more pain meds as this does not seem like a sustainable solution. Pt says his PCP manages his pain outpatient but it continues to bring him to the hospital since it is so bad.

## 2024-02-09 NOTE — Assessment & Plan Note (Signed)
 2D echo March 2025 in the [facility] system with EF of 57% Appears euvolemic Continue home regimen including spironolactone

## 2024-02-09 NOTE — H&P (Addendum)
 History and Physical    Patient: Lawrence [name] ZOX:[id number] DOB: 01/25/1956 DOA: 02/08/2024 DOS: the patient was seen and examined on 02/09/2024 PCP: Malva Limes, MD  Patient coming from: Home  Chief Complaint:  Chief Complaint  Patient presents with   Chest Pain   HPI: [name] is a 68 y.o. male with medical history significant of paroxysmal atrial fibrillation, bradycardia, anxiety, osteoarthritis, asthma, coronary artery disease, chronic chest pain, celiac disease, COPD, chronic hypoxemic respiratory failure, chronic low back pain, GERD, hypertension,, coronary artery disease s/p coronary stent, chronic diastolic and systolic CHF presented with recurrent chest pain.  Patient reports central chest pain moderate intensity over the past 24 to 48 hours.  Patient reports pain similar to prior episodes of MI in the past.  No shortness of breath.  Minimal nausea or diaphoresis.  Patient reports recurring episodes like this in the past.  Denies any recent alcohol or tobacco use.  Reports compliance with home medications including Eliquis, imdur, Ranexa.  Currently followed by [facility] cardiology.  Had stress testing done in December 2024 that was otherwise normal.  Patient reports being previously evaluated by [facility] cardiology with patient being felt to have otherwise optimized medical workup.  No abdominal pain.  No diarrhea.  No focal hemiparesis or confusion.  Noted admission December 2024 for issues including hypotension.  This is resolved per patient.  Currently denies any active weakness. Presented to the ER afebrile, hemodynamically stable.  Satting well on room air.  CBC and BMP grossly within normal limits.  Troponin negative x 2.  Chest x-ray stable.  EKG stable atrial fibrillation. Review of Systems: As mentioned in the history of present illness. All other systems reviewed and are negative. Past Medical History:  Diagnosis Date   A-fib (HCC)    Anemia    Anginal pain (HCC)     Anxiety    Arthritis    Asthma    CAD (coronary artery disease)    a. 2002 CABGx2 (LIMA->LAD, VG->VG->OM1);  b. 09/2012 DES->OM;  c. 03/2015 PTCA of LAD [facility]) in setting of atretic LIMA; d. 05/2015 Cath [facility]): nonobs dzs; e. 06/2015 Cath ([facility]): LM nl, LAD 45p/d ISR, 50d, D1/2 small, LCX 50p/d ISR, OM1 70ost, 30 ISR, VG->OM1 50ost, 45m, LIMA->LAD 99p/d - atretic, RCA dom, nl; f.cath 10/16: 40-50%(FFR 0.90) pLAD, 75% (FFR 0.77) mLAD s/p PCI/DES, oRCA 40% (FFR0.95)   Cancer (HCC)    SKIN CANCER ON BACK   Celiac disease    Chronic diastolic CHF (congestive heart failure) (HCC)    a. 06/2009 Echo: EF 60-65%, Gr 1 DD, triv AI, mildly dil LA, nl RV.   COPD (chronic obstructive pulmonary disease) (HCC)    a. Chronic bronchitis and emphysema.   DDD (degenerative disc disease), lumbar    Diverticulosis    Dysrhythmia    Essential hypertension    GERD (gastroesophageal reflux disease)    History of hiatal hernia    History of kidney stones    H/O   History of tobacco abuse    a. Quit 2014.   Myocardial infarction [facility]) 2002   4 STENTS   Pancreatitis    PSVT (paroxysmal supraventricular tachycardia) (HCC)    a. 10/2012 Noted on Zio Patch.   Sleep apnea    LOST CORD TO CPAP -ONLY 02 @ BEDTIME   Tubular adenoma of colon    Type II diabetes mellitus (HCC)    Past Surgical History:  Procedure Laterality Date   BYPASS GRAFT  CARDIAC CATHETERIZATION N/A 07/12/2015   rocedure: Left Heart Cath and Cors/Grafts Angiography;  Surgeon: Lyn Records, MD;  Location: [facility] INVASIVE CV LAB;  Service: Cardiovascular;  Laterality: N/A;   CARDIAC CATHETERIZATION Right 10/07/2015   Procedure: Left Heart Cath and Cors/Grafts Angiography;  Surgeon: Laurier Nancy, MD;  Location: [facility] INVASIVE CV LAB;  Service: Cardiovascular;  Laterality: Right;   CARDIAC CATHETERIZATION N/A 04/06/2016   Procedure: Left Heart Cath and Coronary Angiography;  Surgeon: Alwyn Pea, MD;  Location: [facility] INVASIVE CV LAB;  Service:  Cardiovascular;  Laterality: N/A;   CARDIAC CATHETERIZATION  04/06/2016   Procedure: Bypass Graft Angiography;  Surgeon: Alwyn Pea, MD;  Location: [facility] INVASIVE CV LAB;  Service: Cardiovascular;;   CARDIAC CATHETERIZATION N/A 11/02/2016   Procedure: Left Heart Cath and Cors/Grafts Angiography and possible PCI;  Surgeon: Alwyn Pea, MD;  Location: [facility] INVASIVE CV LAB;  Service: Cardiovascular;  Laterality: N/A;   CARDIAC CATHETERIZATION N/A 11/02/2016   Procedure: Coronary Stent Intervention;  Surgeon: Alwyn Pea, MD;  Location: [facility] INVASIVE CV LAB;  Service: Cardiovascular;  Laterality: N/A;   CARDIOVERSION N/A 06/29/2022   Procedure: CARDIOVERSION;  Surgeon: Lamar Blinks, MD;  Location: [facility] ORS;  Service: Cardiovascular;  Laterality: N/A;   CHOLECYSTECTOMY     CIRCUMCISION N/A 06/09/2019   Procedure: CIRCUMCISION ADULT;  Surgeon: Sondra Come, MD;  Location: [facility] ORS;  Service: Urology;  Laterality: N/A;   COLONOSCOPY WITH PROPOFOL N/A 04/01/2018   Procedure: COLONOSCOPY WITH PROPOFOL;  Surgeon: Scot Jun, MD;  Location: [facility] ENDOSCOPY;  Service: Endoscopy;  Laterality: N/A;   COLONOSCOPY WITH PROPOFOL N/A 05/01/2023   Procedure: COLONOSCOPY WITH PROPOFOL;  Surgeon: Toney Reil, MD;  Location: [facility] ENDOSCOPY;  Service: Gastroenterology;  Laterality: N/A;   ESOPHAGEAL DILATION     ESOPHAGOGASTRODUODENOSCOPY (EGD) WITH PROPOFOL N/A 04/01/2018   Procedure: ESOPHAGOGASTRODUODENOSCOPY (EGD) WITH PROPOFOL;  Surgeon: Scot Jun, MD;  Location: [facility] ENDOSCOPY;  Service: Endoscopy;  Laterality: N/A;   ESOPHAGOGASTRODUODENOSCOPY (EGD) WITH PROPOFOL N/A 05/01/2023   Procedure: ESOPHAGOGASTRODUODENOSCOPY (EGD) WITH PROPOFOL;  Surgeon: Toney Reil, MD;  Location: [facility] ENDOSCOPY;  Service: Gastroenterology;  Laterality: N/A;   GIVENS CAPSULE STUDY  05/01/2023   Procedure: GIVENS CAPSULE STUDY;  Surgeon: Toney Reil, MD;  Location: [facility]  ENDOSCOPY;  Service: Gastroenterology;;   LEFT HEART CATH AND CORS/GRAFTS ANGIOGRAPHY N/A 06/12/2019   Procedure: LEFT HEART CATH AND CORS/GRAFTS ANGIOGRAPHY;  Surgeon: Dalia Heading, MD;  Location: [facility] INVASIVE CV LAB;  Service: Cardiovascular;  Laterality: N/A;   LEFT HEART CATH AND CORS/GRAFTS ANGIOGRAPHY N/A 03/11/2020   Procedure: LEFT HEART CATH AND CORS/GRAFTS ANGIOGRAPHY;  Surgeon: Marcina Millard, MD;  Location: [facility] INVASIVE CV LAB;  Service: Cardiovascular;  Laterality: N/A;   LEFT HEART CATH AND CORS/GRAFTS ANGIOGRAPHY N/A 05/01/2021   Procedure: LEFT HEART CATH AND CORS/GRAFTS ANGIOGRAPHY;  Surgeon: Lamar Blinks, MD;  Location: [facility] INVASIVE CV LAB;  Service: Cardiovascular;  Laterality: N/A;   LEFT HEART CATH AND CORS/GRAFTS ANGIOGRAPHY N/A 11/02/2022   Procedure: LEFT HEART CATH AND CORS/GRAFTS ANGIOGRAPHY;  Surgeon: Alwyn Pea, MD;  Location: [facility] INVASIVE CV LAB;  Service: Cardiovascular;  Laterality: N/A;   TONSILLECTOMY     VASCULAR SURGERY     Social History:  reports that he quit smoking about 10 years ago. His smoking use included cigarettes. He started smoking about 60 years ago. He has a 150 pack-year smoking history. He has never used smokeless tobacco. He reports that  he does not currently use drugs after having used the following drugs: Marijuana. He reports that he does not drink alcohol.  Allergies  Allergen Reactions   Prednisone Other (See Comments) and Hypertension    Pt states that this medication puts him in A-fib    Demerol  [Meperidine Hcl]    Demerol [Meperidine] Hives   Sulfa Antibiotics Hives   Albuterol Sulfate [Albuterol] Palpitations and Other (See Comments)    Pt currently uses this medication.     Empagliflozin Other (See Comments) and Hives    Perineal pain  Other Reaction(s): Other (See Comments)   Morphine Sulfate Nausea And Vomiting, Rash and Other (See Comments)    Pt states that he is only allergic to the tablet form of  this medication.      Family History  Problem Relation Age of Onset   Heart attack Mother    Depression Mother    Heart disease Mother    COPD Mother    Hypertension Mother    Heart attack Father    Diabetes Father    Depression Father    Heart disease Father    Cirrhosis Father    Parkinson's disease Brother     Prior to Admission medications   Medication Sig Start Date End Date Taking? Authorizing Provider  Accu-Chek Softclix Lancets lancets Use as instructed to check sugar daily for type 2 diabetes. 03/03/21   Malva Limes, MD  allopurinol (ZYLOPRIM) 300 MG tablet Take 300 mg by mouth 2 (two) times daily.    [provider]  ALPRAZolam Prudy Feeler) 1 MG tablet Take 1 tablet (1 mg total) by mouth 3 (three) times daily as needed. 09/13/23   Malva Limes, MD  amLODipine (NORVASC) 10 MG tablet Take 10 mg by mouth daily.    [provider]  apixaban (ELIQUIS) 5 MG TABS tablet Take 1 tablet (5 mg total) by mouth 2 (two) times daily. 10/04/23   Malva Limes, MD  atorvastatin (LIPITOR) 80 MG tablet TAKE 1 TABLET BY MOUTH AT BEDTIME 07/15/23   Malva Limes, MD  Blood Glucose Calibration (ACCU-CHEK GUIDE CONTROL) LIQD Use with blood glucose monitor as directed 12/01/23   Malva Limes, MD  Blood Glucose Monitoring Suppl (ACCU-CHEK GUIDE) w/Device KIT Use to check blood sugars as directed 12/01/23   Malva Limes, MD  Budeson-Glycopyrrol-Formoterol (BREZTRI AEROSPHERE) 160-9-4.8 MCG/ACT AERO Inhale 2 puffs into the lungs 2 (two) times daily. 06/02/23   Malva Limes, MD  cetirizine (ZYRTEC) 10 MG tablet TAKE 1 TABLET BY MOUTH AT BEDTIME 03/12/23   Malva Limes, MD  Cyanocobalamin (VITAMIN B-12) 1000 MCG SUBL Place 1 tablet under the tongue daily.    [provider]  dapagliflozin propanediol (FARXIGA) 10 MG TABS tablet TAKE 1 TABLET BY MOUTH DAILY 01/20/24   Erasmo Downer, MD  gabapentin (NEURONTIN) 300 MG capsule Take 1 capsule at bedtime for  1 week, then increase to 1 twice a day 12/31/23   Malva Limes, MD  glucose blood (ACCU-CHEK GUIDE) test strip Use as instructed to check sugar daily for type 2 diabetes. 03/03/21   Malva Limes, MD  hydrOXYzine (VISTARIL) 25 MG capsule Take 1 capsule (25 mg total) by mouth 3 (three) times daily as needed. 12/20/23   Concha Se, MD  iron polysaccharides (FERREX 150) 150 MG capsule TAKE 1 CAPSULE BY MOUTH DAILY 09/15/23   Malva Limes, MD  isosorbide mononitrate (IMDUR) 60 MG 24 hr tablet  TAKE 1 TABLET BY MOUTH DAILY 12/25/23   Malva Limes, MD  lidocaine (LIDODERM) 5 % Place 1 patch onto the skin daily. Remove & Discard patch within 12 hours or as directed by MD 08/09/23   Arnetha Courser, MD  metFORMIN (GLUCOPHAGE-XR) 500 MG 24 hr tablet TAKE 1 TABLET BY MOUTH DAILY WITH EVENING MEAL 01/12/24   Malva Limes, MD  methocarbamol (ROBAXIN) 500 MG tablet TAKE 1 TABLET BY MOUTH EVERY 8 HOURS AS NEEDED FOR MUSCLE SPASMS 01/13/24   Malva Limes, MD  nitroGLYCERIN (NITROSTAT) 0.4 MG SL tablet Place 1 tablet (0.4 mg total) under the tongue every 5 (five) minutes as needed for chest pain (Do not take if blood pressure is low, systolic BP<110). 10/21/23   Lurene Shadow, MD  omega-3 acid ethyl esters (LOVAZA) 1 g capsule TAKE 4 CAPSULES (4 GRAMS TOTAL) BY MOUTHDAILY 07/09/23   Malva Limes, MD  oxyCODONE-acetaminophen (PERCOCET) 10-325 MG tablet TAKE 1 TABLET BY MOUTH EVERY 4 HOURS AS NEEDED FOR PAIN. 01/31/24   Malva Limes, MD  OXYGEN Inhale 2 L into the lungs at bedtime as needed (for shortness of breath).    [provider]  pantoprazole (PROTONIX) 40 MG tablet TAKE 1 TABLET BY MOUTH 2 TIMES DAILY ( BEFORE A MEAL) 01/12/24   Malva Limes, MD  ranolazine (RANEXA) 1000 MG SR tablet Take 1 tablet (1,000 mg total) by mouth 2 (two) times daily. 08/05/23   Marcelino Duster, MD  sacubitril-valsartan (ENTRESTO) 24-26 MG Take 1 tablet by mouth 2 (two) times daily.     [provider]  sennosides-docusate sodium (SENOKOT-S) 8.6-50 MG tablet Take 1 tablet by mouth daily.    [provider]  spironolactone (ALDACTONE) 25 MG tablet Take 25 mg by mouth daily.    [provider]  sucralfate (CARAFATE) 1 GM/10ML suspension Take 10 mLs (1 g total) by mouth 4 (four) times daily. 11/23/23 11/22/24  Pilar Jarvis, MD  traZODone (DESYREL) 100 MG tablet Take 1 tablet (100 mg total) by mouth at bedtime as needed for sleep. 12/02/23   Malva Limes, MD  venlafaxine (EFFEXOR) 25 MG tablet Take 1 tablet (25 mg total) by mouth daily. 01/16/24   Malva Limes, MD  venlafaxine XR (EFFEXOR-XR) 75 MG 24 hr capsule Take 75 mg by mouth daily with breakfast.    [provider]    Physical Exam: Vitals:   02/09/24 0440 02/09/24 0444 02/09/24 0500 02/09/24 0744  BP: (!) 150/77  126/75   Pulse: 76  89   Resp:   18   Temp:  97.9 F (36.6 C)  97.9 F (36.6 C)  TempSrc:  Oral  Oral  SpO2: 100%  94%   Weight:      Height:       Physical Exam Constitutional:      Appearance: He is normal weight.  HENT:     Head: Normocephalic and atraumatic.     Nose: Nose normal.     Mouth/Throat:     Mouth: Mucous membranes are moist.  Eyes:     Pupils: Pupils are equal, round, and reactive to light.  Cardiovascular:     Rate and Rhythm: Normal rate and regular rhythm.  Pulmonary:     Effort: Pulmonary effort is normal.  Abdominal:     General: Bowel sounds are normal.  Skin:    General: Skin is warm.  Neurological:     General: No focal deficit present.  Psychiatric:  Mood and Affect: Mood normal.     Data Reviewed:  There are no new results to review at this time.  DG Chest 2 View CLINICAL DATA:  Chest pain.  EXAM: CHEST - 2 VIEW  COMPARISON:  January 26, 2024.  FINDINGS: The heart size and mediastinal contours are within normal limits. Sternotomy wires are noted. Both lungs are clear. The visualized skeletal structures are  unremarkable.  IMPRESSION: No active cardiopulmonary disease.  Electronically Signed   By: Lupita Raider M.D.   On: 02/08/2024 18:11  Lab Results  Component Value Date   WBC 5.6 02/08/2024   HGB 15.9 02/08/2024   HCT 50.5 02/08/2024   MCV 89.2 02/08/2024   PLT 238 02/08/2024   Last metabolic panel Lab Results  Component Value Date   GLUCOSE 128 (H) 02/08/2024   NA 138 02/08/2024   K 3.7 02/08/2024   CL 101 02/08/2024   CO2 31 02/08/2024   BUN 14 02/08/2024   CREATININE 1.02 02/08/2024   GFRNONAA >60 02/08/2024   CALCIUM 9.4 02/08/2024   PHOS 4.0 09/30/2023   PROT 6.6 01/21/2024   ALBUMIN 3.8 01/21/2024   LABGLOB 2.0 09/03/2023   AGRATIO 1.6 12/23/2021   BILITOT 0.9 01/21/2024   ALKPHOS 60 01/21/2024   AST 13 (L) 01/21/2024   ALT 15 01/21/2024   ANIONGAP 6 02/08/2024   Assessment and Plan: Chest pain CAD status post CABG and stenting Chronic chest pain Recurring central chest pain with diaphoresis in the setting of prior history of CAD s/p CABG and coronary stent  Chronic chest pain is a recurring issue  Noted normal stress test in the [facility] system December 2024 Troponin negative x 2. EKG stable atrial fibrillation Will cycle cardiac enzymes Continue home regimen including Ranexa and Eliquis Consult cardiology as appropriate  Chronic combined systolic (congestive) and diastolic (congestive) heart failure (HCC) 2D echo March 2025 in the [facility] system with EF of 57% Appears euvolemic Continue home regimen including spironolactone  Diabetes mellitus without complication (HCC) Blood sugar 120s SSI Monitor  COPD (chronic obstructive pulmonary disease) (HCC) Stable from a respiratory standpoint Continue home inhalers  Essential hypertension BP stable Noted prior admissions for issues including hypotension-resolved Continue home regimen-titrate as appropriate  OSA (obstructive sleep apnea) CPAP  Depression with anxiety Stable Continue medications  including Effexor  Atrial fibrillation, chronic (HCC) Rate controlled at present Continue home regimen including Eliquis Noted prior episodes of labile heart rate from bradycardia to RVR Not on beta-blocker Otherwise monitor  Gout Continue allopurinol      Advance Care Planning:   Code Status: Limited: Do not attempt resuscitation (DNR) -DNR-LIMITED -Do Not Intubate/DNI    Consults: Cardiology consultation as appropriate   Family Communication: No family at the bedside   Severity of Illness: The appropriate patient status for this patient is OBSERVATION. Observation status is judged to be reasonable and necessary in order to provide the required intensity of service to ensure the patient's safety. The patient's presenting symptoms, physical exam findings, and initial radiographic and laboratory data in the context of their medical condition is felt to place them at decreased risk for further clinical deterioration. Furthermore, it is anticipated that the patient will be medically stable for discharge from the hospital within 2 midnights of admission.   Author: Floydene Flock, MD 02/09/2024 8:00 AM  For on call review [email or web address].

## 2024-02-09 NOTE — Assessment & Plan Note (Signed)
 Rate controlled at present Continue home regimen including Eliquis Noted prior episodes of labile heart rate from bradycardia to RVR Not on beta-blocker Otherwise monitor

## 2024-02-09 NOTE — Assessment & Plan Note (Signed)
 Continue allopurinol

## 2024-02-09 NOTE — Assessment & Plan Note (Signed)
>>  ASSESSMENT AND PLAN FOR ATRIAL FIBRILLATION, CHRONIC (HCC) WRITTEN ON 02/09/2024  7:58 AM BY NEWTON, STEVEN J, MD  Rate controlled at present Continue home regimen including Eliquis  Noted prior episodes of labile heart rate from bradycardia to RVR Not on beta-blocker Otherwise monitor

## 2024-02-09 NOTE — ED Notes (Signed)
 First encounter with pt. Pt placed into gown. Pt resting comfortably in bed at this time. Pt is alert and oriented with even and regular respirations. No acute distress noted. Pt denies any needs at this time. Call light within reach.

## 2024-02-09 NOTE — Assessment & Plan Note (Signed)
 CPAP.

## 2024-02-09 NOTE — ED Notes (Signed)
 CCMD called to place pt on monitor

## 2024-02-09 NOTE — Assessment & Plan Note (Signed)
>>  ASSESSMENT AND PLAN FOR DIABETES MELLITUS WITHOUT COMPLICATION (HCC) WRITTEN ON 02/09/2024  7:56 AM BY NEWTON, STEVEN J, MD  Blood sugar 120s SSI Monitor

## 2024-02-09 NOTE — Assessment & Plan Note (Addendum)
 CAD status post CABG and stenting Chronic chest pain Recurring central chest pain with diaphoresis in the setting of prior history of CAD s/p CABG and coronary stent  Chronic chest pain is a recurring issue  Noted normal stress test in the [facility] system December 2024 Troponin negative x 2. EKG stable atrial fibrillation Will cycle cardiac enzymes Continue home regimen including Ranexa and Eliquis Consult cardiology as appropriate

## 2024-02-09 NOTE — ED Notes (Signed)
 Pt brief changed, NAD at this time

## 2024-02-09 NOTE — Assessment & Plan Note (Signed)
 Blood sugar 120s SSI Monitor

## 2024-02-09 NOTE — Assessment & Plan Note (Addendum)
 BP stable Noted prior admissions for issues including hypotension-resolved Continue home regimen-titrate as appropriate

## 2024-02-10 DIAGNOSIS — R079 Chest pain, unspecified: Secondary | ICD-10-CM | POA: Diagnosis not present

## 2024-02-10 LAB — CBG MONITORING, ED
Glucose-Capillary: 155 mg/dL — ABNORMAL HIGH (ref 70–99)
Glucose-Capillary: 111 mg/dL — ABNORMAL HIGH (ref 70–99)

## 2024-02-10 MED ORDER — AMLODIPINE BESYLATE 5 MG PO TABS
10.0000 mg | ORAL_TABLET | Freq: Every day | ORAL | Status: DC
Start: 2024-02-11 — End: 2024-02-10

## 2024-02-10 MED ORDER — SPIRONOLACTONE 25 MG PO TABS: 12.5000 mg | ORAL_TABLET | Freq: Every day | ORAL | 11 refills | Status: DC

## 2024-02-10 MED ORDER — SPIRONOLACTONE 12.5 MG HALF TABLET: 12.5000 mg | ORAL_TABLET | Freq: Every day | ORAL | Status: DC

## 2024-02-10 MED ORDER — ISOSORBIDE MONONITRATE ER 120 MG PO TB24: 120.0000 mg | ORAL_TABLET | Freq: Every day | ORAL | 11 refills | Status: DC

## 2024-02-10 NOTE — Care Management Obs Status (Signed)
 MEDICARE OBSERVATION STATUS NOTIFICATION   Patient Details  Name: Lawrence [name] MRN: [id number] Date of Birth: 24-Mar-1956   Medicare Observation Status Notification Given:  Yes    Kizzie Ide Maysel Mccolm, RN 02/10/2024, 10:26 AM

## 2024-02-10 NOTE — Progress Notes (Signed)
 [facility] CLINIC CARDIOLOGY PROGRESS NOTE       Patient ID: Lawrence [name] MRN: [id number] DOB/AGE: 07-07-1956 68 y.o.  Admit date: 02/08/2024 Referring Physician Dr. Doree Albee Primary Physician Sherrie Mustache, Demetrios Isaacs, MD  Primary Cardiologist Dr. Massie [facility]) Reason for Consultation chest pain  HPI: Lawrence [name] is a 68 y.o. male  with a past medical history of CAD (h/o STEMI s/p CABG x2 LIMA-LAD and SVG-OM1 2002 c/b coronary artery dissection with multiple subsequent PCI and stents), chronic stable angina, HFpEF (50-55%, mild LVH 10/2022) paroxysmal atrial fibrillation s/p unsuccessful DCCV 06/29/2022, COPD (PRN 2L), hx tobacco use, DM2  who presented to the ED on 02/08/2024 for chest pain. Cardiology was consulted for further evaluation.   Interval history: -Patient seen and examined this morning, resting comfortably in hospital bed. -States that he slept well last night, denies any chest pain. -Heart rate remained stable.  BP borderline low but he denies any dizziness.  Review of systems complete and found to be negative unless listed above    Past Medical History:  Diagnosis Date   A-fib (HCC)    Anemia    Anginal pain (HCC)    Anxiety    Arthritis    Asthma    CAD (coronary artery disease)    a. 2002 CABGx2 (LIMA->LAD, VG->VG->OM1);  b. 09/2012 DES->OM;  c. 03/2015 PTCA of LAD [facility]) in setting of atretic LIMA; d. 05/2015 Cath [facility]): nonobs dzs; e. 06/2015 Cath ([facility]): LM nl, LAD 45p/d ISR, 50d, D1/2 small, LCX 50p/d ISR, OM1 70ost, 30 ISR, VG->OM1 50ost, 68m, LIMA->LAD 99p/d - atretic, RCA dom, nl; f.cath 10/16: 40-50%(FFR 0.90) pLAD, 75% (FFR 0.77) mLAD s/p PCI/DES, oRCA 40% (FFR0.95)   Cancer (HCC)    SKIN CANCER ON BACK   Celiac disease    Chronic diastolic CHF (congestive heart failure) (HCC)    a. 06/2009 Echo: EF 60-65%, Gr 1 DD, triv AI, mildly dil LA, nl RV.   COPD (chronic obstructive pulmonary disease) (HCC)    a. Chronic bronchitis and emphysema.   DDD  (degenerative disc disease), lumbar    Diverticulosis    Dysrhythmia    Essential hypertension    GERD (gastroesophageal reflux disease)    History of hiatal hernia    History of kidney stones    H/O   History of tobacco abuse    a. Quit 2014.   Myocardial infarction [facility]) 2002   4 STENTS   Pancreatitis    PSVT (paroxysmal supraventricular tachycardia) (HCC)    a. 10/2012 Noted on Zio Patch.   Sleep apnea    LOST CORD TO CPAP -ONLY 02 @ BEDTIME   Tubular adenoma of colon    Type II diabetes mellitus (HCC)     Past Surgical History:  Procedure Laterality Date   BYPASS GRAFT     CARDIAC CATHETERIZATION N/A 07/12/2015   rocedure: Left Heart Cath and Cors/Grafts Angiography;  Surgeon: Lyn Records, MD;  Location: [facility] INVASIVE CV LAB;  Service: Cardiovascular;  Laterality: N/A;   CARDIAC CATHETERIZATION Right 10/07/2015   Procedure: Left Heart Cath and Cors/Grafts Angiography;  Surgeon: Laurier Nancy, MD;  Location: [facility] INVASIVE CV LAB;  Service: Cardiovascular;  Laterality: Right;   CARDIAC CATHETERIZATION N/A 04/06/2016   Procedure: Left Heart Cath and Coronary Angiography;  Surgeon: Alwyn Pea, MD;  Location: [facility] INVASIVE CV LAB;  Service: Cardiovascular;  Laterality: N/A;   CARDIAC CATHETERIZATION  04/06/2016   Procedure: Bypass Graft Angiography;  Surgeon: Bobbie Stack  Callwood, MD;  Location: [facility] INVASIVE CV LAB;  Service: Cardiovascular;;   CARDIAC CATHETERIZATION N/A 11/02/2016   Procedure: Left Heart Cath and Cors/Grafts Angiography and possible PCI;  Surgeon: Alwyn Pea, MD;  Location: [facility] INVASIVE CV LAB;  Service: Cardiovascular;  Laterality: N/A;   CARDIAC CATHETERIZATION N/A 11/02/2016   Procedure: Coronary Stent Intervention;  Surgeon: Alwyn Pea, MD;  Location: [facility] INVASIVE CV LAB;  Service: Cardiovascular;  Laterality: N/A;   CARDIOVERSION N/A 06/29/2022   Procedure: CARDIOVERSION;  Surgeon: Lamar Blinks, MD;  Location: [facility] ORS;  Service:  Cardiovascular;  Laterality: N/A;   CHOLECYSTECTOMY     CIRCUMCISION N/A 06/09/2019   Procedure: CIRCUMCISION ADULT;  Surgeon: Sondra Come, MD;  Location: [facility] ORS;  Service: Urology;  Laterality: N/A;   COLONOSCOPY WITH PROPOFOL N/A 04/01/2018   Procedure: COLONOSCOPY WITH PROPOFOL;  Surgeon: Scot Jun, MD;  Location: [facility] ENDOSCOPY;  Service: Endoscopy;  Laterality: N/A;   COLONOSCOPY WITH PROPOFOL N/A 05/01/2023   Procedure: COLONOSCOPY WITH PROPOFOL;  Surgeon: Toney Reil, MD;  Location: [facility] ENDOSCOPY;  Service: Gastroenterology;  Laterality: N/A;   ESOPHAGEAL DILATION     ESOPHAGOGASTRODUODENOSCOPY (EGD) WITH PROPOFOL N/A 04/01/2018   Procedure: ESOPHAGOGASTRODUODENOSCOPY (EGD) WITH PROPOFOL;  Surgeon: Scot Jun, MD;  Location: [facility] ENDOSCOPY;  Service: Endoscopy;  Laterality: N/A;   ESOPHAGOGASTRODUODENOSCOPY (EGD) WITH PROPOFOL N/A 05/01/2023   Procedure: ESOPHAGOGASTRODUODENOSCOPY (EGD) WITH PROPOFOL;  Surgeon: Toney Reil, MD;  Location: [facility] ENDOSCOPY;  Service: Gastroenterology;  Laterality: N/A;   GIVENS CAPSULE STUDY  05/01/2023   Procedure: GIVENS CAPSULE STUDY;  Surgeon: Toney Reil, MD;  Location: [facility] ENDOSCOPY;  Service: Gastroenterology;;   LEFT HEART CATH AND CORS/GRAFTS ANGIOGRAPHY N/A 06/12/2019   Procedure: LEFT HEART CATH AND CORS/GRAFTS ANGIOGRAPHY;  Surgeon: Dalia Heading, MD;  Location: [facility] INVASIVE CV LAB;  Service: Cardiovascular;  Laterality: N/A;   LEFT HEART CATH AND CORS/GRAFTS ANGIOGRAPHY N/A 03/11/2020   Procedure: LEFT HEART CATH AND CORS/GRAFTS ANGIOGRAPHY;  Surgeon: Marcina Millard, MD;  Location: [facility] INVASIVE CV LAB;  Service: Cardiovascular;  Laterality: N/A;   LEFT HEART CATH AND CORS/GRAFTS ANGIOGRAPHY N/A 05/01/2021   Procedure: LEFT HEART CATH AND CORS/GRAFTS ANGIOGRAPHY;  Surgeon: Lamar Blinks, MD;  Location: [facility] INVASIVE CV LAB;  Service: Cardiovascular;  Laterality: N/A;   LEFT HEART CATH AND  CORS/GRAFTS ANGIOGRAPHY N/A 11/02/2022   Procedure: LEFT HEART CATH AND CORS/GRAFTS ANGIOGRAPHY;  Surgeon: Alwyn Pea, MD;  Location: [facility] INVASIVE CV LAB;  Service: Cardiovascular;  Laterality: N/A;   TONSILLECTOMY     VASCULAR SURGERY      (Not in a hospital admission)  Social History   Socioeconomic History   Marital status: Widowed    Spouse name: Not on file   Number of children: 1   Years of education: Not on file   Highest education level: 10th grade  Occupational History   Occupation: Disabled   Occupation: on Tree surgeon  Tobacco Use   Smoking status: Former    Current packs/day: 0.00    Average packs/day: 3.0 packs/day for 50.0 years (150.0 ttl pk-yrs)    Types: Cigarettes    Start date: 04/23/1963    Quit date: 04/22/2013    Years since quitting: 10.8   Smokeless tobacco: Never   Tobacco comments:    Reports not smoking for approx 8 years.  Vaping Use   Vaping status: Never Used  Substance and Sexual Activity   Alcohol use: No    Comment:  remotely quit alcohol use. Hx of heavy alcohol use.   Drug use: Not Currently    Types: Marijuana   Sexual activity: Not Currently  Other Topics Concern   Not on file  Social History Narrative   Pt lives in [place] with wife.  Does not routinely exercise.   Social Drivers of Corporate investment banker Strain: Low Risk  (01/13/2024)   Overall Financial Resource Strain (CARDIA)    Difficulty of Paying Living Expenses: Not very hard  Food Insecurity: Food Insecurity Present (02/09/2024)   Hunger Vital Sign    Worried About Running Out of Food in the Last Year: Sometimes true    Ran Out of Food in the Last Year: Sometimes true  Transportation Needs: No Transportation Needs (02/09/2024)   PRAPARE - Administrator, Civil Service (Medical): No    Lack of Transportation (Non-Medical): No  Physical Activity: Inactive (01/13/2024)   Exercise Vital Sign    Days of Exercise per Week: 0 days    Minutes of  Exercise per Session: 0 min  Stress: Stress Concern Present (01/13/2024)   [employer] of Occupational Health - Occupational Stress Questionnaire    Feeling of Stress : To some extent  Social Connections: Moderately Isolated (02/09/2024)   Social Connection and Isolation Panel [NHANES]    Frequency of Communication with Friends and Family: More than three times a week    Frequency of Social Gatherings with Friends and Family: More than three times a week    Attends Religious Services: 1 to 4 times per year    Active Member of [employer] or Organizations: No    Attends Banker Meetings: Never    Marital Status: Widowed  Intimate Partner Violence: Not At Risk (02/09/2024)   Humiliation, Afraid, Rape, and Kick questionnaire    Fear of Current or Ex-Partner: No    Emotionally Abused: No    Physically Abused: No    Sexually Abused: No    Family History  Problem Relation Age of Onset   Heart attack Mother    Depression Mother    Heart disease Mother    COPD Mother    Hypertension Mother    Heart attack Father    Diabetes Father    Depression Father    Heart disease Father    Cirrhosis Father    Parkinson's disease Brother      Vitals:   02/10/24 0300 02/10/24 0400 02/10/24 0700 02/10/24 0745  BP: 107/61 96/60  105/81  Pulse: 84 83 81 84  Resp: 14 14 16 12   Temp:    98.5 F (36.9 C)  TempSrc:    Oral  SpO2: 93% 95%  92%  Weight:      Height:        PHYSICAL EXAM General: Chronically ill-appearing elderly male, well nourished, in no acute distress. HEENT: Normocephalic and atraumatic. Neck: No JVD.  Lungs: Normal respiratory effort on 2L [place].  Diminished bilaterally Heart: Regularly irregular, controlled rate. Normal S1 and S2 without gallops or murmurs.  Abdomen: Non-distended appearing.  Msk: Normal strength and tone for age. Extremities: Warm and well perfused. No clubbing, cyanosis.  No edema.  Neuro: Alert and oriented X 3. Psych: Answers questions  appropriately.   Labs: Basic Metabolic Panel: Recent Labs    02/08/24 1859  NA 138  K 3.7  CL 101  CO2 31  GLUCOSE 128*  BUN 14  CREATININE 1.02  CALCIUM 9.4   Liver Function Tests: No  results for input(s): "AST", "ALT", "ALKPHOS", "BILITOT", "PROT", "ALBUMIN" in the last 72 hours. No results for input(s): "LIPASE", "AMYLASE" in the last 72 hours. CBC: Recent Labs    02/08/24 1736  WBC 5.6  HGB 15.9  HCT 50.5  MCV 89.2  PLT 238   Cardiac Enzymes: Recent Labs    02/08/24 1736 02/08/24 2014  TROPONINIHS 13 15   BNP: No results for input(s): "BNP" in the last 72 hours. D-Dimer: No results for input(s): "DDIMER" in the last 72 hours. Hemoglobin A1C: No results for input(s): "HGBA1C" in the last 72 hours. Fasting Lipid Panel: No results for input(s): "CHOL", "HDL", "LDLCALC", "TRIG", "CHOLHDL", "LDLDIRECT" in the last 72 hours. Thyroid Function Tests: No results for input(s): "TSH", "T4TOTAL", "T3FREE", "THYROIDAB" in the last 72 hours.  Invalid input(s): "FREET3" Anemia Panel: No results for input(s): "VITAMINB12", "FOLATE", "FERRITIN", "TIBC", "IRON", "RETICCTPCT" in the last 72 hours.   Radiology: DG Chest 2 View Result Date: 02/08/2024 CLINICAL DATA:  Chest pain. EXAM: CHEST - 2 VIEW COMPARISON:  January 26, 2024. FINDINGS: The heart size and mediastinal contours are within normal limits. Sternotomy wires are noted. Both lungs are clear. The visualized skeletal structures are unremarkable. IMPRESSION: No active cardiopulmonary disease. Electronically Signed   By: Lupita Raider M.D.   On: 02/08/2024 18:11   DG Chest 1 View Result Date: 01/27/2024 CLINICAL DATA:  Chest pain. EXAM: CHEST  1 VIEW COMPARISON:  01/21/2024.  Chest CT 01/22/2024 FINDINGS: The cardiomediastinal contours are stable. Previous median sternotomy and CABG. Pulmonary vasculature is normal. No consolidation, pleural effusion, or pneumothorax. No acute osseous abnormalities are seen. IMPRESSION:  No active disease. Electronically Signed   By: Narda Rutherford M.D.   On: 01/27/2024 00:01   CT Angio Chest/Abd/Pel for Dissection W and/or Wo Contrast Result Date: 01/22/2024 CLINICAL DATA:  Acute aortic syndrome suspected. EXAM: CT ANGIOGRAPHY CHEST, ABDOMEN AND PELVIS TECHNIQUE: Non-contrast CT of the chest was initially obtained. Multidetector CT imaging through the chest, abdomen and pelvis was performed using the standard protocol during bolus administration of intravenous contrast. Multiplanar reconstructed images and MIPs were obtained and reviewed to evaluate the vascular anatomy. RADIATION DOSE REDUCTION: This exam was performed according to the departmental dose-optimization program which includes automated exposure control, adjustment of the mA and/or kV according to patient size and/or use of iterative reconstruction technique. CONTRAST:  OMNIPAQUE IOHEXOL 350 MG/ML SOLN COMPARISON:  CT angiogram chest abdomen and pelvis 10/02/2023. FINDINGS: CTA CHEST FINDINGS Cardiovascular: Preferential opacification of the thoracic aorta. No evidence of thoracic aortic aneurysm or dissection. Normal heart size. No pericardial effusion. There is moderate calcified atherosclerotic disease throughout the aorta. Patient is status post cardiac surgery. Mediastinum/Nodes: No enlarged mediastinal, hilar, or axillary lymph nodes. Thyroid gland, trachea, and esophagus demonstrate no significant findings. Lungs/Pleura: 8 mm right middle lobe nodular density has decreased in size now measuring 5 mm. There is a new 5 mm right lower lobe nodule image 6/38. There is no focal lung infiltrate, pleural effusion or pneumothorax. Musculoskeletal: Degenerative changes affect the spine. Sternotomy wires are present. Review of the MIP images confirms the above findings. CTA ABDOMEN AND PELVIS FINDINGS VASCULAR Aorta: Normal caliber aorta without aneurysm, dissection, vasculitis or significant stenosis. There is moderate  calcified atherosclerotic disease throughout the aorta. Celiac: Patent without evidence of aneurysm, dissection, vasculitis or significant stenosis. SMA: Patent without evidence of aneurysm, dissection, vasculitis or significant stenosis. Renals: Both renal arteries are patent without evidence of aneurysm, dissection, vasculitis, fibromuscular dysplasia or  significant stenosis. IMA: Patent without evidence of aneurysm, dissection, vasculitis or significant stenosis. Inflow: Patent without evidence of aneurysm, dissection, vasculitis or significant stenosis. Veins: No obvious venous abnormality within the limitations of this arterial phase study. Review of the MIP images confirms the above findings. NON-VASCULAR Hepatobiliary: No focal liver abnormality is seen. Status post cholecystectomy. No biliary dilatation. Pancreas: Unremarkable. No pancreatic ductal dilatation or surrounding inflammatory changes. Spleen: Normal in size without focal abnormality. Adrenals/Urinary Tract: Adrenal glands are unremarkable. Kidneys are normal, without renal calculi, focal lesion, or hydronephrosis. Bladder is unremarkable. Stomach/Bowel: Stomach is within normal limits. Appendix appears normal. No evidence of bowel wall thickening, distention, or inflammatory changes. Lymphatic: No enlarged lymph nodes are seen. Reproductive: Prostate is unremarkable. Other: No abdominal wall hernia or abnormality. No abdominopelvic ascites. Musculoskeletal: No acute or significant osseous findings. Review of the MIP images confirms the above findings. IMPRESSION: 1. No evidence for aortic aneurysm or dissection. 2. No acute localizing process in the chest, abdomen or pelvis. 3. New 5 mm right lower lobe pulmonary nodule. No follow-up needed if patient is low-risk.This recommendation follows the consensus statement: Guidelines for Management of Incidental Pulmonary Nodules Detected on CT Images: From the Fleischner Society 2017; Radiology 2017;  [id number]. 4. Aortic atherosclerosis. Aortic Atherosclerosis (ICD10-I70.0). Electronically Signed   By: Darliss Cheney M.D.   On: 01/22/2024 02:20   DG Chest 2 View Result Date: 01/22/2024 CLINICAL DATA:  Chest pain EXAM: CHEST - 2 VIEW COMPARISON:  01/11/2024 FINDINGS: Sternotomy. No acute airspace disease or effusion. Normal cardiomediastinal silhouette with aortic atherosclerosis. Multilevel degenerative changes of the spine IMPRESSION: No active cardiopulmonary disease. Electronically Signed   By: Jasmine Pang M.D.   On: 01/22/2024 00:45    LHC 08/2023 at [facility]: CONCLUSIONS:  Complex coronary artery disease. LM normal; LAD with moderate diffuse  disease and patent stent; 50% ostial LCX with competitive flow in OM1; RCA  with mild diffuse disease.  1/2 bypass grafts open: LIMA to LAD atretic, SVG to OM1 with moderate  stenosis in the first portion of the graft body, similar to previous.  Elevated LVEDP = 24 mmHg.   Nuclear stress 10/2023 at [facility]: IMPRESSIONS:  -Abnormal myocardial perfusion study  -Fixed decreased counts in the apex, anteroapical wall, and inferoapical wall, most consistent with scar.  - Left ventricular systolic function is abnormal. Post stress the ejection fraction is 35%. The left ventricle is normal in size.  -Dense atherosclerotic calcifications in the coronary arteries. Changes of prior coronary artery bypass. Atherosclerotic calcifications in the thoracic aorta. Status post cholecystectomy.   ECHO 02/01/2024 at [facility]: Summary   1. Technically difficult study.    2. The left ventricle is normal in size with mildly increased wall  thickness.   3. The left ventricular ejection fraction was quantified (MOD bi-plane) at  57 %.    4. The right ventricle is normal in size, with probably normal systolic  function.   5. The left atrium is mildly dilated in size.    6. There is no pericardial effusion.    7. Compared to study dated 11/15/23, there is no significant  change.   TELEMETRY reviewed by me 02/10/2024: atrial fibrillation rate 70s  EKG reviewed by me: Atrial fibrillation rate 70s  Data reviewed by me 02/10/2024: last 24h vitals tele labs imaging I/O hospitalist progress note  Active Problems:   Depression with anxiety   OSA (obstructive sleep apnea)   Chronic combined systolic (congestive) and diastolic (congestive) heart failure (HCC)  Gout   Essential hypertension   Diabetes mellitus without complication (HCC)   Chest pain   COPD (chronic obstructive pulmonary disease) (HCC)   Atrial fibrillation, chronic (HCC)    ASSESSMENT AND PLAN:  Lawrence [name] is a 68 y.o. male  with a past medical history of CAD (h/o STEMI s/p CABG x2 LIMA-LAD and SVG-OM1 2002 c/b coronary artery dissection with multiple subsequent PCI and stents), chronic stable angina, HFpEF (50-55%, mild LVH 10/2022) paroxysmal atrial fibrillation s/p unsuccessful DCCV 06/29/2022, COPD (PRN 2L), hx tobacco use, DM2  who presented to the ED on 02/08/2024 for chest pain. Cardiology was consulted for further evaluation.   # Chest pain # CAD s/p CABG with chronic angina # Chronic HFrEF, recovered Patient with 1 day of CP which has been persistent.  Opponents normal x 2 at 13, 15. EKG nonacute. -Continue Imdur 120 mg daily and home ranexa 1000 mg bid. -Continue home atorvastatin 80 mg daily and plavix 75 mg daily. -Continue Entresto 24-26 mg twice daily, spironolactone 12.5 mg daily, Farxiga 10 mg daily. -No plan for further cardiac diagnostics at this time.  # Persistent atrial fibrillation Rates controlled on telemetry. -Continue Eliquis 5 mg twice daily for stroke risk reduction. -Not currently on any rate control medications due to history of bradycardia.  Ok for discharge today from a cardiac perspective. Will arrange for follow up in clinic with Dr. Massie Maroon [facility]) in 1-2 weeks.    This patient's plan of care was discussed and created with Dr. Corky Sing and he is in  agreement.  Signed: Gale Journey, PA-C  02/10/2024, 9:40 AM [facility] Cardiology

## 2024-02-10 NOTE — ED Notes (Addendum)
 Messaged hospitalist, Jon Billings, NP, regarding morning lab work, awaiting response at this time.   1191- no new orders needed at this time per provider

## 2024-02-10 NOTE — ED Notes (Addendum)
 Patient discharged from hospital by provider. Discharge instructions reviewed with patient and all questions answered. Patient wheeled from ED in NAD.

## 2024-02-10 NOTE — Care Management Obs Status (Signed)
 MEDICARE OBSERVATION STATUS NOTIFICATION   Patient Details  Name: Lawrence [name] MRN: [id number] Date of Birth: 02/26/1956   Medicare Observation Status Notification Given:  Yes    Kizzie Ide Tigran Haynie, RN 02/10/2024, 10:31 AM

## 2024-02-10 NOTE — Discharge Summary (Signed)
 [facility] Discharge Summary   Patient: Lawrence [name]:[id number]  PCP: Malva Limes, MD  Date of admission: 02/08/2024   Date of discharge:  02/10/2024     Discharge Diagnoses:  Active Problems:   Chest pain   Chronic combined systolic (congestive) and diastolic (congestive) heart failure (HCC)   Diabetes mellitus without complication (HCC)   Essential hypertension   COPD (chronic obstructive pulmonary disease) (HCC)   Depression with anxiety   OSA (obstructive sleep apnea)   Gout   Atrial fibrillation, chronic (HCC)   Admitted From: Home Disposition:  Home   Recommendations for Outpatient Follow-up:  PCP: Home Follow up LABS/TEST:  Home   Follow-up Information     Lovenia Shuck, MD. Go in 1 week(s).   Specialty: Cardiology Contact information: [address] [place] [facility] [place] [address] [phone]         Malva Limes, MD Follow up in 1 week(s).   Specialty: Family Medicine Contact information: [address] [place] [place] [address] [phone]                Diet recommendation: Cardiac and Carb modified diet  Activity: The patient is advised to gradually reintroduce usual activities, as tolerated  Discharge Condition: stable  Code Status: Full code   History of present illness: As per the H and P dictated on admission. Hospital Course:  Lawrence [name] is a 68 y.o. male with medical history significant of paroxysmal atrial fibrillation, bradycardia, anxiety, osteoarthritis, asthma, coronary artery disease, chronic chest pain, celiac disease, COPD, chronic hypoxemic respiratory failure, chronic low back pain, GERD, hypertension,, coronary artery disease s/p coronary stent, chronic diastolic and systolic CHF presented with recurrent chest pain.  Patient reports central chest pain moderate intensity over the past 24 to 48 hours.  Patient reports pain similar to prior episodes of MI in the past.  No shortness of  breath.  Minimal nausea or diaphoresis.  Patient reports recurring episodes like this in the past.  Denies any recent alcohol or tobacco use.  Reports compliance with home medications including Eliquis, imdur, Ranexa.  Currently followed by [facility] cardiology.  Had stress testing done in December 2024 that was otherwise normal.  Patient reports being previously evaluated by [facility] cardiology with patient being felt to have otherwise optimized medical workup.  No abdominal pain.  No diarrhea.  No focal hemiparesis or confusion.  Noted admission December 2024 for issues including hypotension.  This is resolved per patient.  Currently denies any active weakness. Presented to the ER afebrile, hemodynamically stable.  Satting well on room air.  CBC and BMP grossly within normal limits.  Troponin negative x 2.  Chest x-ray stable.  EKG stable atrial fibrillation.  Assessment and Plan:  # Chest pain # CAD status post CABG and stenting # Chronic chest pain Recurring central chest pain with diaphoresis in the setting of prior history of CAD s/p CABG and coronary stent  Chronic chest pain is a recurring issue. Noted normal stress test in the [facility] system December 2024 Troponin negative x 2. EKG stable atrial fibrillation Continue home regimen including Ranexa and Eliquis Consulted cardiology, recommended to increase dose of Imdur 120 mg p.o. daily.  No further cardiac workup and follow-up as an outpatient.  Patient remained chest pain-free and agreed with the cardiology plan to discharge home.   # Chronic combined systolic (congestive) and diastolic (congestive) heart failure: 2D echo March 2025 in the [facility] system with EF  of 57% Appears euvolemic. Continue home regimen including spironolactone   #  Diabetes mellitus without complication: Resumed home meds.  Recommended to continue diabetic diet and monitor CBG at home. # COPD (chronic obstructive pulmonary disease): stable, no exacerbation noticed.  Continued  home inhalers. # Essential hypertension: Resumed home meds.  Advised to monitor BP at home and follow with cardiology and PCP as an outpatient. # OSA (obstructive sleep apnea) continue CPAP # Depression with anxiety: Continue home nmeds including Effexor # Atrial fibrillation, chronic: Rate controlled, Continue home regimen including Eliquis. Noted prior episodes of labile heart rate from bradycardia to RVR. Not on beta-blocker # Gout: Continue allopurinol  Body mass index is 28.98 kg/m.  Nutrition Interventions:  - Patient was instructed, not to drive, operate heavy machinery, perform activities at heights, swimming or participation in water activities or provide baby sitting services while on Pain, Sleep and Anxiety Medications; until his outpatient Physician has advised to do so again.  - Also recommended to not to take more than prescribed Pain, Sleep and Anxiety Medications.  Patient was ambulatory without any assistance. On the day of the discharge the patient's vitals were stable, and no other acute medical condition were reported by patient. the patient was felt safe to be discharge at Home.  Consultants: Cardiology Procedures: None  Discharge Exam: General: Appear in no distress, no Rash; Oral Mucosa Clear, moist. Cardiovascular: S1 and S2 Present, no Murmur, Respiratory: normal respiratory effort, Bilateral Air entry present and no Crackles, no wheezes Abdomen: Bowel Sound present, Soft and no tenderness, no hernia Extremities: no Pedal edema, no calf tenderness Neurology: alert and oriented to time, place, and person affect appropriate.  Filed Weights   02/08/24 1748  Weight: 83.9 kg   Vitals:   02/10/24 0700 02/10/24 0745  BP:  105/81  Pulse: 81 84  Resp: 16 12  Temp:  98.5 F (36.9 C)  SpO2:  92%    DISCHARGE MEDICATION: Allergies as of 02/10/2024       Reactions   Prednisone Other (See Comments), Hypertension   Pt states that this medication puts him in  A-fib    Demerol  [meperidine Hcl]    Demerol [meperidine] Hives   Sulfa Antibiotics Hives   Albuterol Sulfate [albuterol] Palpitations, Other (See Comments)   Pt currently uses this medication.     Empagliflozin Other (See Comments), Hives   Perineal pain Other Reaction(s): Other (See Comments)   Morphine Sulfate Nausea And Vomiting, Rash, Other (See Comments)   Pt states that he is only allergic to the tablet form of this medication.          Medication List     TAKE these medications    Accu-Chek Guide Control Liqd Use with blood glucose monitor as directed   Accu-Chek Guide test strip Generic drug: glucose blood Use as instructed to check sugar daily for type 2 diabetes.   Accu-Chek Guide w/Device Kit Use to check blood sugars as directed   Accu-Chek Softclix Lancets lancets Use as instructed to check sugar daily for type 2 diabetes.   allopurinol 300 MG tablet Commonly known as: ZYLOPRIM Take 300 mg by mouth 2 (two) times daily.   ALPRAZolam 1 MG tablet Commonly known as: XANAX Take 1 tablet (1 mg total) by mouth 3 (three) times daily as needed.   amLODipine 10 MG tablet Commonly known as: NORVASC Take 10 mg by mouth daily.   apixaban 5 MG Tabs tablet Commonly known as: Eliquis Take 1 tablet (  5 mg total) by mouth 2 (two) times daily.   atorvastatin 80 MG tablet Commonly known as: LIPITOR TAKE 1 TABLET BY MOUTH AT BEDTIME   Breztri Aerosphere 160-9-4.8 MCG/ACT Aero Generic drug: budeson-glycopyrrolate-formoterol Inhale 2 puffs into the lungs 2 (two) times daily.   cetirizine 10 MG tablet Commonly known as: ZYRTEC TAKE 1 TABLET BY MOUTH AT BEDTIME   Farxiga 10 MG Tabs tablet Generic drug: dapagliflozin propanediol TAKE 1 TABLET BY MOUTH DAILY   Ferrex 150 150 MG capsule Generic drug: iron polysaccharides TAKE 1 CAPSULE BY MOUTH DAILY   gabapentin 300 MG capsule Commonly known as: NEURONTIN Take 1 capsule at bedtime for 1 week, then increase to  1 twice a day   hydrOXYzine 25 MG capsule Commonly known as: VISTARIL Take 1 capsule (25 mg total) by mouth 3 (three) times daily as needed.   isosorbide mononitrate 120 MG 24 hr tablet Commonly known as: IMDUR Take 1 tablet (120 mg total) by mouth daily. What changed:  medication strength how much to take   lidocaine 5 % Commonly known as: LIDODERM Place 1 patch onto the skin daily. Remove & Discard patch within 12 hours or as directed by MD   metFORMIN 500 MG 24 hr tablet Commonly known as: GLUCOPHAGE-XR TAKE 1 TABLET BY MOUTH DAILY WITH EVENING MEAL   methocarbamol 500 MG tablet Commonly known as: ROBAXIN TAKE 1 TABLET BY MOUTH EVERY 8 HOURS AS NEEDED FOR MUSCLE SPASMS   nitroGLYCERIN 0.4 MG SL tablet Commonly known as: NITROSTAT Place 1 tablet (0.4 mg total) under the tongue every 5 (five) minutes as needed for chest pain (Do not take if blood pressure is low, systolic BP<110).   omega-3 acid ethyl esters 1 g capsule Commonly known as: LOVAZA TAKE 4 CAPSULES (4 GRAMS TOTAL) BY MOUTHDAILY   oxyCODONE-acetaminophen 10-325 MG tablet Commonly known as: PERCOCET TAKE 1 TABLET BY MOUTH EVERY 4 HOURS AS NEEDED FOR PAIN.   OXYGEN Inhale 2 L into the lungs at bedtime as needed (for shortness of breath).   pantoprazole 40 MG tablet Commonly known as: PROTONIX TAKE 1 TABLET BY MOUTH 2 TIMES DAILY ( BEFORE A MEAL)   ranolazine 1000 MG SR tablet Commonly known as: RANEXA Take 1 tablet (1,000 mg total) by mouth 2 (two) times daily.   sacubitril-valsartan 24-26 MG Commonly known as: ENTRESTO Take 1 tablet by mouth 2 (two) times daily.   sennosides-docusate sodium 8.6-50 MG tablet Commonly known as: SENOKOT-S Take 1 tablet by mouth daily.   spironolactone 25 MG tablet Commonly known as: ALDACTONE Take 0.5 tablets (12.5 mg total) by mouth daily. What changed: how much to take   sucralfate 1 GM/10ML suspension Commonly known as: Carafate Take 10 mLs (1 g  total) by mouth 4 (four) times daily.   traZODone 100 MG tablet Commonly known as: DESYREL Take 1 tablet (100 mg total) by mouth at bedtime as needed for sleep.   venlafaxine XR 75 MG 24 hr capsule Commonly known as: EFFEXOR-XR Take 75 mg by mouth daily with breakfast.   venlafaxine 25 MG tablet Commonly known as: EFFEXOR Take 1 tablet (25 mg total) by mouth daily.   Vitamin B-12 1000 MCG Subl Place 1 tablet under the tongue daily.       Allergies  Allergen Reactions   Prednisone Other (See Comments) and Hypertension    Pt states that this medication puts him in A-fib    Demerol  [Meperidine Hcl]    Demerol [Meperidine] Hives  Sulfa Antibiotics Hives   Albuterol Sulfate [Albuterol] Palpitations and Other (See Comments)    Pt currently uses this medication.     Empagliflozin Other (See Comments) and Hives    Perineal pain  Other Reaction(s): Other (See Comments)   Morphine Sulfate Nausea And Vomiting, Rash and Other (See Comments)    Pt states that he is only allergic to the tablet form of this medication.     Discharge Instructions     Call MD for:  difficulty breathing, headache or visual disturbances   Complete by: As directed    Call MD for:  extreme fatigue   Complete by: As directed    Call MD for:  persistant dizziness or light-headedness   Complete by: As directed    Call MD for:  persistant nausea and vomiting   Complete by: As directed    Call MD for:  severe uncontrolled pain   Complete by: As directed    Call MD for:  temperature >100.4   Complete by: As directed    Diet - low sodium heart healthy   Complete by: As directed    Discharge instructions   Complete by: As directed    Follow with PCP in 1 week, Follow-up with cardiology in 1 week Follow-up with GI if persistent epigastric pain, patient may need EGD.   Increase activity slowly   Complete by: As directed        The results of significant diagnostics from this hospitalization  (including imaging, microbiology, ancillary and laboratory) are listed below for reference.    Significant Diagnostic Studies: DG Chest 2 View Result Date: 02/08/2024 CLINICAL DATA:  Chest pain. EXAM: CHEST - 2 VIEW COMPARISON:  January 26, 2024. FINDINGS: The heart size and mediastinal contours are within normal limits. Sternotomy wires are noted. Both lungs are clear. The visualized skeletal structures are unremarkable. IMPRESSION: No active cardiopulmonary disease. Electronically Signed   By: Lupita Raider M.D.   On: 02/08/2024 18:11   DG Chest 1 View Result Date: 01/27/2024 CLINICAL DATA:  Chest pain. EXAM: CHEST  1 VIEW COMPARISON:  01/21/2024.  Chest CT 01/22/2024 FINDINGS: The cardiomediastinal contours are stable. Previous median sternotomy and CABG. Pulmonary vasculature is normal. No consolidation, pleural effusion, or pneumothorax. No acute osseous abnormalities are seen. IMPRESSION: No active disease. Electronically Signed   By: Narda Rutherford M.D.   On: 01/27/2024 00:01   CT Angio Chest/Abd/Pel for Dissection W and/or Wo Contrast Result Date: 01/22/2024 CLINICAL DATA:  Acute aortic syndrome suspected. EXAM: CT ANGIOGRAPHY CHEST, ABDOMEN AND PELVIS TECHNIQUE: Non-contrast CT of the chest was initially obtained. Multidetector CT imaging through the chest, abdomen and pelvis was performed using the standard protocol during bolus administration of intravenous contrast. Multiplanar reconstructed images and MIPs were obtained and reviewed to evaluate the vascular anatomy. RADIATION DOSE REDUCTION: This exam was performed according to the departmental dose-optimization program which includes automated exposure control, adjustment of the mA and/or kV according to patient size and/or use of iterative reconstruction technique. CONTRAST:  OMNIPAQUE IOHEXOL 350 MG/ML SOLN COMPARISON:  CT angiogram chest abdomen and pelvis 10/02/2023. FINDINGS: CTA CHEST FINDINGS Cardiovascular: Preferential  opacification of the thoracic aorta. No evidence of thoracic aortic aneurysm or dissection. Normal heart size. No pericardial effusion. There is moderate calcified atherosclerotic disease throughout the aorta. Patient is status post cardiac surgery. Mediastinum/Nodes: No enlarged mediastinal, hilar, or axillary lymph nodes. Thyroid gland, trachea, and esophagus demonstrate no significant findings. Lungs/Pleura: 8 mm right middle lobe nodular  density has decreased in size now measuring 5 mm. There is a new 5 mm right lower lobe nodule image 6/38. There is no focal lung infiltrate, pleural effusion or pneumothorax. Musculoskeletal: Degenerative changes affect the spine. Sternotomy wires are present. Review of the MIP images confirms the above findings. CTA ABDOMEN AND PELVIS FINDINGS VASCULAR Aorta: Normal caliber aorta without aneurysm, dissection, vasculitis or significant stenosis. There is moderate calcified atherosclerotic disease throughout the aorta. Celiac: Patent without evidence of aneurysm, dissection, vasculitis or significant stenosis. SMA: Patent without evidence of aneurysm, dissection, vasculitis or significant stenosis. Renals: Both renal arteries are patent without evidence of aneurysm, dissection, vasculitis, fibromuscular dysplasia or significant stenosis. IMA: Patent without evidence of aneurysm, dissection, vasculitis or significant stenosis. Inflow: Patent without evidence of aneurysm, dissection, vasculitis or significant stenosis. Veins: No obvious venous abnormality within the limitations of this arterial phase study. Review of the MIP images confirms the above findings. NON-VASCULAR Hepatobiliary: No focal liver abnormality is seen. Status post cholecystectomy. No biliary dilatation. Pancreas: Unremarkable. No pancreatic ductal dilatation or surrounding inflammatory changes. Spleen: Normal in size without focal abnormality. Adrenals/Urinary Tract: Adrenal glands are unremarkable. Kidneys are  normal, without renal calculi, focal lesion, or hydronephrosis. Bladder is unremarkable. Stomach/Bowel: Stomach is within normal limits. Appendix appears normal. No evidence of bowel wall thickening, distention, or inflammatory changes. Lymphatic: No enlarged lymph nodes are seen. Reproductive: Prostate is unremarkable. Other: No abdominal wall hernia or abnormality. No abdominopelvic ascites. Musculoskeletal: No acute or significant osseous findings. Review of the MIP images confirms the above findings. IMPRESSION: 1. No evidence for aortic aneurysm or dissection. 2. No acute localizing process in the chest, abdomen or pelvis. 3. New 5 mm right lower lobe pulmonary nodule. No follow-up needed if patient is low-risk.This recommendation follows the consensus statement: Guidelines for Management of Incidental Pulmonary Nodules Detected on CT Images: From the Fleischner Society 2017; Radiology 2017; 284:228-243. 4. Aortic atherosclerosis. Aortic Atherosclerosis (ICD10-I70.0). Electronically Signed   By: Darliss Cheney M.D.   On: 01/22/2024 02:20   DG Chest 2 View Result Date: 01/22/2024 CLINICAL DATA:  Chest pain EXAM: CHEST - 2 VIEW COMPARISON:  01/11/2024 FINDINGS: Sternotomy. No acute airspace disease or effusion. Normal cardiomediastinal silhouette with aortic atherosclerosis. Multilevel degenerative changes of the spine IMPRESSION: No active cardiopulmonary disease. Electronically Signed   By: Jasmine Pang M.D.   On: 01/22/2024 00:45    Microbiology: No results found for this or any previous visit (from the past 240 hours).   Labs: CBC: Recent Labs  Lab 02/08/24 1736  WBC 5.6  HGB 15.9  HCT 50.5  MCV 89.2  PLT 238   Basic Metabolic Panel: Recent Labs  Lab 02/08/24 1859  NA 138  K 3.7  CL 101  CO2 31  GLUCOSE 128*  BUN 14  CREATININE 1.02  CALCIUM 9.4   Liver Function Tests: No results for input(s): "AST", "ALT", "ALKPHOS", "BILITOT", "PROT", "ALBUMIN" in the last 168 hours. No  results for input(s): "LIPASE", "AMYLASE" in the last 168 hours. No results for input(s): "AMMONIA" in the last 168 hours. Cardiac Enzymes: No results for input(s): "CKTOTAL", "CKMB", "CKMBINDEX", "TROPONINI" in the last 168 hours. BNP (last 3 results) Recent Labs    10/18/23 1549 01/11/24 0023 01/21/24 2308  BNP 196.3* 608.4* 235.4*   CBG: Recent Labs  Lab 02/09/24 0728 02/09/24 1139 02/09/24 1702 02/09/24 2229 02/10/24 0723  GLUCAP 107* 102* 92 89 155*    Time spent: 35 minutes  Signed:  Gillis Santa  [facility] 02/10/2024 10:43 AM

## 2024-02-11 ENCOUNTER — Encounter: Payer: Self-pay | Admitting: *Deleted

## 2024-02-11 ENCOUNTER — Telehealth: Payer: Self-pay | Admitting: *Deleted

## 2024-02-11 NOTE — Patient Outreach (Signed)
 Follow up phone call to patient. Patient's grandson answered stating that patient was eating and requested a call at a later date. Patient re-scheduled for 02/17/24 2pm.   Verna Czech, LCSW [place]  [facility], [facility] Licensed Clinical Social Worker Care Coordinator  Direct Dial: [phone]

## 2024-02-16 ENCOUNTER — Other Ambulatory Visit: Payer: Self-pay | Admitting: Family Medicine

## 2024-02-17 ENCOUNTER — Ambulatory Visit: Payer: Self-pay | Admitting: *Deleted

## 2024-02-17 NOTE — Patient Outreach (Signed)
 Care Coordination   Follow Up Visit Note   02/17/2024 Name: Lawrence [name] MRN: [id number] DOB: [birth year]  Lawrence [name] is a 68 y.o. year old male who sees [name], Demetrios Isaacs, MD for primary care. I spoke with  Lawrence [name] by phone today.  What matters to the patients health and wellness today?  Patient declined referral for home based medical care-does not want to change primary doctors, remains active with [facility] for ongoing mental health. Main concern is housing at this time, currently living in a 1 bedroom apt, with caregiver. Presently 4th on waiting list for [employer], checks status daily.   Goals Addressed             This Visit's Progress    Care Coordination Activities       Activities and task to complete in order to accomplish goals.   Follow up with housing resources discussed-continue to follow up on referral with the [employer] all upcoming appointment discussed today Continue with compliance of taking medication prescribed by Doctor Self Support options  (continue ongoing follow up with [facility]-next appt 02/18/24 to manage stress and anxiety) CSW to continue to collaborate with RNCM regarding status of patient's health condition              SDOH assessments and interventions completed:  No     Care Coordination Interventions:  Yes, provided  Interventions Today    Flowsheet Row Most Recent Value  Chronic Disease   Chronic disease during today's visit Congestive Heart Failure (CHF), Atrial Fibrillation (AFib)  [Non specified Atypical chest pain]  General Interventions   General Interventions Discussed/Reviewed General Interventions Reviewed, [employer] DC from the hospital due to chest pain-scope done-found 2 small bleeding ulcers-recommended for another scope-requesting assistance with housing resources-currently on 4th on waiting list for [employer] daily on status([address])]  Doctor  Visits Discussed/Reviewed Doctor Visits Discussed, Annual Wellness Visits  [4/25 PCP 4/29 [facility] denies referral for home based medical care -does not want to lose primary care doctor]  PCP/Specialist Visits Compliance with follow-up visit  Mental Health Interventions   Mental Health Discussed/Reviewed Mental Health Discussed  [continues to follow up with Psychiatrist through [facility] and therapist-next appt 02/18/24-working on anxiety and stress management-close follow up reinforced]  Pharmacy Interventions   Pharmacy Dicussed/Reviewed Pharmacy Topics Discussed, Medication Adherence  Safety Interventions   Safety Discussed/Reviewed Safety Discussed       Follow up plan: Follow up call scheduled for 02/22/24    Encounter Outcome:  Patient Visit Completed

## 2024-02-17 NOTE — Patient Instructions (Signed)
 Visit Information  Thank you for taking time to visit with me today. Please don't hesitate to contact me if I can be of assistance to you.   Following are the goals we discussed today:   Goals Addressed             This Visit's Progress    Care Coordination Activities       Activities and task to complete in order to accomplish goals.   Follow up with housing resources discussed-continue to follow up on referral with the [employer] all upcoming appointment discussed today Continue with compliance of taking medication prescribed by Doctor Self Support options  (continue ongoing follow up with [facility]-next appt 02/18/24 to manage stress and anxiety) CSW to continue to collaborate with [facility] regarding status of patient's health condition              Our next appointment is by telephone on 02/22/24 at 9am  Please call the care guide team at [phone] if you need to cancel or reschedule your appointment.   If you are experiencing a Mental Health or Behavioral Health Crisis or need someone to talk to, please call the Suicide and Crisis Lifeline: 988   Patient verbalizes understanding of instructions and care plan provided today and agrees to view in MyChart. Active MyChart status and patient understanding of how to access instructions and care plan via MyChart confirmed with patient.     Telephone follow up appointment with care management team member scheduled for: 02/22/24  Emmert Roethler, LCSW Transition of Care [phone]

## 2024-02-18 ENCOUNTER — Emergency Department
Admission: EM | Admit: 2024-02-18 | Discharge: 2024-02-18 | Disposition: A | Discharge status: Home / Self Care | Attending: Emergency Medicine | Admitting: Emergency Medicine

## 2024-02-18 ENCOUNTER — Other Ambulatory Visit: Payer: Self-pay

## 2024-02-18 ENCOUNTER — Emergency Department

## 2024-02-18 ENCOUNTER — Encounter: Payer: Self-pay | Admitting: Emergency Medicine

## 2024-02-18 DIAGNOSIS — E875 Hyperkalemia: Secondary | ICD-10-CM | POA: Diagnosis not present

## 2024-02-18 DIAGNOSIS — S0990XA Unspecified injury of head, initial encounter: Secondary | ICD-10-CM | POA: Insufficient documentation

## 2024-02-18 DIAGNOSIS — W228XXA Striking against or struck by other objects, initial encounter: Secondary | ICD-10-CM | POA: Insufficient documentation

## 2024-02-18 LAB — CBC WITH DIFFERENTIAL/PLATELET
Abs Immature Granulocytes: 0.02 10*3/uL (ref 0.00–0.07)
Basophils Absolute: 0 10*3/uL (ref 0.0–0.1)
Basophils Relative: 1 %
Eosinophils Absolute: 0.3 10*3/uL (ref 0.0–0.5)
Eosinophils Relative: 5 %
HCT: 46.8 % (ref 39.0–52.0)
Hemoglobin: 14.7 g/dL (ref 13.0–17.0)
Immature Granulocytes: 0 %
Lymphocytes Relative: 22 %
Lymphs Abs: 1.4 10*3/uL (ref 0.7–4.0)
MCH: 28.3 pg (ref 26.0–34.0)
MCHC: 31.4 g/dL (ref 30.0–36.0)
MCV: 90.2 fL (ref 80.0–100.0)
Monocytes Absolute: 0.6 10*3/uL (ref 0.1–1.0)
Monocytes Relative: 9 %
Neutro Abs: 3.8 10*3/uL (ref 1.7–7.7)
Neutrophils Relative %: 63 %
Platelets: 199 10*3/uL (ref 150–400)
RBC: 5.19 MIL/uL (ref 4.22–5.81)
RDW: 14 % (ref 11.5–15.5)
WBC: 6 10*3/uL (ref 4.0–10.5)
nRBC: 0 % (ref 0.0–0.2)

## 2024-02-18 LAB — BASIC METABOLIC PANEL WITH GFR
Anion gap: 8 (ref 5–15)
BUN: 16 mg/dL (ref 8–23)
CO2: 29 mmol/L (ref 22–32)
Calcium: 9.7 mg/dL (ref 8.9–10.3)
Chloride: 103 mmol/L (ref 98–111)
Creatinine, Ser: 1.03 mg/dL (ref 0.61–1.24)
GFR, Estimated: >60 mL/min (ref >60)
Glucose, Bld: 122 mg/dL — ABNORMAL HIGH (ref 70–99)
Potassium: 5.4 mmol/L — ABNORMAL HIGH (ref 3.5–5.1)
Sodium: 140 mmol/L (ref 135–145)

## 2024-02-18 LAB — TROPONIN I (HIGH SENSITIVITY): Troponin I (High Sensitivity): 11 ng/L (ref <18)

## 2024-02-18 MED ORDER — OXYCODONE-ACETAMINOPHEN 5-325 MG PO TABS
2.0000 | ORAL_TABLET | Freq: Once | ORAL | Status: AC
  Administered 2024-02-18: 2 via ORAL
  Filled 2024-02-18: qty 2

## 2024-02-18 NOTE — ED Provider Notes (Signed)
 [facility] Provider Note    Event Date/Time   First MD Initiated Contact with Patient 02/18/24 [phone]     (approximate)   History   Head Injury   HPI Lawrence [name] is a 68 y.o. male presenting today for head injury.  Patient states earlier this evening he was getting food as a freezer when he lifted his head up and hit the top of his head on the freezer door.  No loss of consciousness.  Noted some slight swelling to the top of his head and a headache.  Denied any neck pain or obvious neck injury.  Initially had no other acute concerns in triage.  After coming back to the room and being told workup was initially reassuring, patient then stated he has also been having his chronic chest pain going on for several days now.  Feels similar to baseline.  No other associated symptoms.     Physical Exam   Triage Vital Signs: ED Triage Vitals [02/18/24 0153]  Encounter Vitals Group     BP (!) 157/99     Systolic BP Percentile      Diastolic BP Percentile      Pulse Rate 89     Resp 18     Temp 97.9 F (36.6 C)     Temp Source Oral     SpO2 95 %     Weight 176 lb (79.8 kg)     Height 5\' 7"  (1.702 m)     Head Circumference      Peak Flow      Pain Score 9     Pain Loc      Pain Education      Exclude from Growth Chart     Most recent vital signs: Vitals:   02/18/24 0153 02/18/24 0450  BP: (!) 157/99 (!) 150/103  Pulse: 89 85  Resp: 18 15  Temp: 97.9 F (36.6 C)   SpO2: 95% 99%   Physical Exam: I have reviewed the vital signs and nursing notes. General: Awake, alert, no acute distress.  Nontoxic appearing. Head:  Atraumatic, normocephalic.   ENT:  EOM intact, PERRL. Oral mucosa is pink and moist with no lesions. Neck: Neck is supple with full range of motion, No meningeal signs. Cardiovascular:  RRR, No murmurs. Peripheral pulses palpable and equal bilaterally. Respiratory:  Symmetrical chest wall expansion.  No rhonchi, rales, or wheezes.   Good air movement throughout.  No use of accessory muscles.   Musculoskeletal:  No cyanosis or edema. Moving extremities with full ROM Abdomen:  Soft, nontender, nondistended. Neuro:  GCS 15, moving all four extremities, interacting appropriately. Speech clear. Psych:  Calm, appropriate.   Skin:  Warm, dry, no rash.    ED Results / Procedures / Treatments   Labs (all labs ordered are listed, but only abnormal results are displayed) Labs Reviewed  BASIC METABOLIC PANEL WITH GFR - Abnormal; Notable for the following components:      Result Value   Potassium 5.4 (*)    Glucose, Bld 122 (*)    All other components within normal limits  CBC WITH DIFFERENTIAL/PLATELET  TROPONIN I (HIGH SENSITIVITY)     EKG My EKG interpretation: Rate of 85, atrial fibrillation.  Normal axis, normal intervals.  No acute ST elevations or depressions.   RADIOLOGY Independently interpreted CT head and C-spine with no acute traumatic pathology   PROCEDURES:  Critical Care performed: No  Procedures   MEDICATIONS ORDERED IN ED:  Medications  oxyCODONE-acetaminophen (PERCOCET/ROXICET) 5-325 MG per tablet 2 tablet (2 tablets Oral Given 02/18/24 0518)     IMPRESSION / MDM / ASSESSMENT AND PLAN / ED COURSE  I reviewed the triage vital signs and the nursing notes.                              Differential diagnosis includes, but is not limited to, ICH, cervical spine injury, soft tissue hematoma, chronic chest pain syndrome  Patient's presentation is most consistent with acute complicated illness / injury requiring diagnostic workup.  Patient is a 68 year old male presenting today for superior head injury without loss of consciousness.  Is on blood thinners will get CT imaging for further evaluation.  Otherwise no acute neurological symptoms.  CT imaging of head and C-spine negative for traumatic pathology.  As we are planning to discharge patient, he then stated he has also been having chest pain  recently for several days.  Unsure if it is his baseline chest pain for which she has been evaluated for many times in the ED.  Denied any other associated symptoms.  EKG was obtained consistent with his baseline.  Laboratory workup with CBC and BMP largely unremarkable aside from slight hyperkalemia but no EKG changes.  Troponin consistent with his baseline and given chronicity of symptoms, no indication to repeat.  Patient was given Percocet and safer discharge and follow-up with his PCP.  The patient is on the cardiac monitor to evaluate for evidence of arrhythmia and/or significant heart rate changes. Clinical Course as of 02/18/24 0526  Fri Feb 18, 2024  0517 Troponin I (High Sensitivity): 11 At his baseline.  Chest pain is chronic.  No indication to repeat [DW]    Clinical Course User Index [DW] Janith Lima, MD     FINAL CLINICAL IMPRESSION(S) / ED DIAGNOSES   Final diagnoses:  Injury of head, initial encounter     Rx / DC Orders   ED Discharge Orders     None        Note:  This document was prepared using Dragon voice recognition software and may include unintentional dictation errors.   Janith Lima, MD 02/18/24 [phone]

## 2024-02-18 NOTE — ED Triage Notes (Signed)
 Pt to triage via w/c with no distress noted; reports approx 9pm he hit the top of his head on the freezer; c/o HA since unrelieved by his oxycodone

## 2024-02-18 NOTE — Discharge Instructions (Signed)
 CT imaging of your head and neck showed no acute traumatic injuries.  You might have some soft tissue swelling on the outside of the head but nothing more concerning.  Your chest pain workup was also reassuring today.  Please follow-up with your cardiology team and primary care provider.

## 2024-02-22 ENCOUNTER — Encounter: Payer: Self-pay | Admitting: *Deleted

## 2024-02-23 ENCOUNTER — Other Ambulatory Visit: Payer: Self-pay | Admitting: Family Medicine

## 2024-02-28 ENCOUNTER — Other Ambulatory Visit: Payer: Self-pay

## 2024-02-28 NOTE — Patient Outreach (Signed)
 Complex Care Management   Visit Note  02/28/2024  Name:  Lawrence [name] MRN: [id number] DOB: 01-04-1956  Situation: Referral received for Complex Care Management related to Heart Failure, COPD, Atrial Fibrillation,HTN, HLD and DM.  I obtained verbal consent from Patient.  Visit completed with Lawrence [name] and his caregiver/grandson Lawrence [name] via telephone.    Background:   Past Medical History:  Diagnosis Date   A-fib (HCC)    Anemia    Anginal pain (HCC)    Anxiety    Arthritis    Asthma    CAD (coronary artery disease)    a. 2002 CABGx2 (LIMA->LAD, VG->VG->OM1);  b. 09/2012 DES->OM;  c. 03/2015 PTCA of LAD [facility]) in setting of atretic LIMA; d. 05/2015 Cath [facility]): nonobs dzs; e. 06/2015 Cath ([facility]): LM nl, LAD 45p/d ISR, 50d, D1/2 small, LCX 50p/d ISR, OM1 70ost, 30 ISR, VG->OM1 50ost, 23m, LIMA->LAD 99p/d - atretic, RCA dom, nl; f.cath 10/16: 40-50%(FFR 0.90) pLAD, 75% (FFR 0.77) mLAD s/p PCI/DES, oRCA 40% (FFR0.95)   Cancer (HCC)    SKIN CANCER ON BACK   Celiac disease    Chronic diastolic CHF (congestive heart failure) (HCC)    a. 06/2009 Echo: EF 60-65%, Gr 1 DD, triv AI, mildly dil LA, nl RV.   COPD (chronic obstructive pulmonary disease) (HCC)    a. Chronic bronchitis and emphysema.   DDD (degenerative disc disease), lumbar    Diverticulosis    Dysrhythmia    Essential hypertension    GERD (gastroesophageal reflux disease)    History of hiatal hernia    History of kidney stones    H/O   History of tobacco abuse    a. Quit 2014.   Myocardial infarction [facility]) 2002   4 STENTS   Pancreatitis    PSVT (paroxysmal supraventricular tachycardia) (HCC)    a. 10/2012 Noted on Zio Patch.   Sleep apnea    LOST CORD TO CPAP -ONLY 02 @ BEDTIME   Tubular adenoma of colon    Type II diabetes mellitus (HCC)     Assessment: Patient Reported Symptoms:  Cognitive Cognitive Status: Alert and oriented to person, place, and time, Normal speech and language skills, Struggling with memory recall  (Requesting referral to Neurology due to changes in short term memory) Cognitive/Intellectual Conditions Management [RPT]: None reported or documented in medical history or problem list   Health Maintenance Behaviors: Annual physical exam Healing Pattern: Slow Health Facilitated by: Rest  Neurological   Neurological Conditions:  (Reports recent changes in short term memory. Requesting referral for Neurology.) Neurological Management Strategies: Adequate rest, Coping strategies, Routine screening Neurological Self-Management Outcome: 3 (uncertain) Neurological Comment: Requesting referral to Neurologist to discuss options for treating short term memory loss  HEENT HEENT Symptoms Reported: No symptoms reported HEENT Conditions: Vision problem(s) Vision Problems: cataract(s) HEENT Management Strategies: Routine screening HEENT Self-Management Outcome: 3 (uncertain) HEENT Comment: Denies urgent need for appointment, but reports episodes of blurred vision for several months. He currently wears reading glasse and due for an eye exam. Pending call to  [facility] to schedule a Eye Exam. Vision problem(s)  Cardiovascular Cardiovascular Symptoms Reported: No symptoms reported Does patient have uncontrolled Hypertension?: No Cardiovascular Conditions: Chest pain, Coronary artery disease, Dysrhythmia, Heart failure, High blood cholesterol, Hypertension Cardiovascular Management Strategies: Adequate rest, Coping strategies, Diet modification, Medical device, Medication therapy, Routine screening, Weight management Do You Have a Working Readable Scale?: Yes Weight: 176 lb (79.8 kg) Cardiovascular Self-Management Outcome: 3 (uncertain)  Respiratory Respiratory Symptoms Reported:  Productive cough (Reports productive cough for several days. Describes sputum as "clear") Respiratory Conditions: COPD, Sleep disordered breathing Respiratory Self-Management Outcome: 4 (good)  Endocrine Patient reports  the following symptoms related to hypoglycemia or hyperglycemia : No symptoms reported (No acute symptoms reported) Is patient diabetic?: Yes Is patient checking blood sugars at home?: Yes Endocrine Conditions: Diabetes Endocrine Management Strategies: Adequate rest, Diet modification, Medical device, Medication therapy, Routine screening, Weight management Endocrine Self-Management Outcome: 3 (uncertain)  Gastrointestinal Gastrointestinal Symptoms Reported: No symptoms reported Gastrointestinal Conditions: Reflux/heartburn Gastrointestinal Management Strategies: Coping strategies, Diet modification Gastrointestinal Self-Management Outcome: 3 (uncertain)    Genitourinary   Genitourinary Conditions: Incontinence, Other (Benign Prostatic Hyperplasia) Genitourinary Management Strategies: Coping strategies, Medication therapy, Incontinence garment/pad Genitourinary Self-Management Outcome: 3 (uncertain)  Integumentary Integumentary Symptoms Reported: No symptoms reported Skin Comment: No Symptoms reported  Musculoskeletal Musculoskelatal Symptoms Reviewed: Difficulty walking Musculoskeletal Conditions: Mobility limited, Unsteady gait Musculoskeletal Management Strategies: Medical device, Routine screening Musculoskeletal Comment: Requires assistive device with all ambulation      Psychosocial Psychosocial Symptoms Reported: No symptoms reported (History or grief and generalized anxiety) Behavioral Health Conditions: Anxiety Behavioral Management Strategies: Coping strategies, Support system, Counseling, Medication therapy Behavioral Health Self-Management Outcome: 3 (uncertain) Major Change/Loss/Stressor/Fears (CP): Death of a loved one (Discussed grief related to loss of his spouse during the Covid pandemic) Techniques to Cope with Loss/Stress/Change: Counseling, Diversional activities Quality of Family Relationships: supportive Do you feel physically threatened by others?: No       02/28/2024    5:22 PM  Depression screen PHQ 2/9  Decreased Interest 0  Down, Depressed, Hopeless 0  PHQ - 2 Score 0    Vitals:    Medications Reviewed Today     Reviewed by Roxie Cord, RN (Registered Nurse) on 02/28/24 at 1718  Med List Status: <None>   Medication Order Taking? Sig Documenting Provider Last Dose Status Informant  Accu-Chek Softclix Lancets lancets [id number] No Use as instructed to check sugar daily for type 2 diabetes. Lamon Pillow, MD Taking Active Pharmacy Records, Other  allopurinol (ZYLOPRIM) 300 MG tablet [id number] No Take 300 mg by mouth 2 (two) times daily. [provider] Taking Active   ALPRAZolam (XANAX) 1 MG tablet [id number] No Take 1 tablet (1 mg total) by mouth 3 (three) times daily as needed. Lamon Pillow, MD Taking Active Pharmacy Records, Other  amLODipine (NORVASC) 10 MG tablet [id number] No Take 10 mg by mouth daily. [provider] Taking Active Self  apixaban (ELIQUIS) 5 MG TABS tablet [id number] No Take 1 tablet (5 mg total) by mouth 2 (two) times daily. Lamon Pillow, MD Taking Active Pharmacy Records, Other  atorvastatin (LIPITOR) 80 MG tablet [id number] No TAKE 1 TABLET BY MOUTH AT BEDTIME Lamon Pillow, MD Taking Active Pharmacy Records, Other  Blood Glucose Calibration (ACCU-CHEK GUIDE CONTROL) LIQD [id number] No Use with blood glucose monitor as directed Lamon Pillow, MD Taking Active   Blood Glucose Monitoring Suppl (ACCU-CHEK GUIDE) w/Device KIT [id number] No Use to check blood sugars as directed Lamon Pillow, MD Taking Active   Budeson-Glycopyrrol-Formoterol (BREZTRI AEROSPHERE) 160-9-4.8 MCG/ACT AERO [id number] No Inhale 2 puffs into the lungs 2 (two) times daily. Lamon Pillow, MD Taking Active Pharmacy Records, Other  carvedilol (COREG) 3.125 MG tablet [id number]  Take 3.125 mg by mouth 2 (two) times daily with a meal. [provider]  Active   carvedilol (COREG) 3.125 MG tablet [id number]   TAKE 1 TABLET BY MOUTH TWICE A DAY Jeralene Mom  E, MD  Active   cetirizine (ZYRTEC) 10 MG tablet [id number] No TAKE 1 TABLET BY MOUTH AT BEDTIME Lamon Pillow, MD Taking Active Pharmacy Records, Other           Med Note (BEJOS, ANJA J   Mon Oct 18, 2023 10:05 PM) Last filled 4D supply in June  Cyanocobalamin (VITAMIN B-12) 1000 MCG SUBL [id number] No Place 1 tablet under the tongue daily. [provider] Taking Active Pharmacy Records, Other  dapagliflozin propanediol (FARXIGA) 10 MG TABS tablet [id number]  TAKE 1 TABLET BY MOUTH DAILY Bacigalupo, Angela M, MD  Active   gabapentin (NEURONTIN) 300 MG capsule [id number]  Take 1 capsule at bedtime for 1 week, then increase to 1 twice a day Lamon Pillow, MD  Active   glucose blood (ACCU-CHEK GUIDE) test strip [id number] No Use as instructed to check sugar daily for type 2 diabetes. Lamon Pillow, MD Taking Active Pharmacy Records, Other  hydrOXYzine (VISTARIL) 25 MG capsule [id number] No Take 1 capsule (25 mg total) by mouth 3 (three) times daily as needed. Lubertha Rush, MD Taking Active   iron polysaccharides (FERREX 150) 150 MG capsule [id number] No TAKE 1 CAPSULE BY MOUTH DAILY [name], Erlinda Haws, MD Taking Active Pharmacy Records, Other  isosorbide mononitrate (IMDUR) 120 MG 24 hr tablet [id number]  Take 1 tablet (120 mg total) by mouth daily. Althia Atlas, MD  Active   lidocaine (LIDODERM) 5 % [id number] No Place 1 patch onto the skin daily. Remove & Discard patch within 12 hours or as directed by MD Luna Salinas, MD Taking Active Pharmacy Records, Other  metFORMIN (GLUCOPHAGE-XR) 500 MG 24 hr tablet [id number]  TAKE 1 TABLET BY MOUTH DAILY WITH EVENING MEAL Lamon Pillow, MD  Active   methocarbamol (ROBAXIN) 500 MG tablet [id number]  TAKE 1 TABLET BY MOUTH EVERY 8 HOURS AS NEEDED FOR MUSCLE SPASMS Lamon Pillow, MD  Active   nitroGLYCERIN (NITROSTAT) 0.4 MG SL tablet [id number] No Place 1 tablet (0.4 mg total) under the tongue every 5  (five) minutes as needed for chest pain (Do not take if blood pressure is low, systolic BP<110). Sheril Dines, MD Taking Active   omega-3 acid ethyl esters (LOVAZA) 1 g capsule [id number] No TAKE 4 CAPSULES (4 GRAMS TOTAL) BY Suzzanna Estimable, MD Taking Active Pharmacy Records, Other  oxyCODONE-acetaminophen (PERCOCET) 10-325 MG tablet [id number]  TAKE 1 TABLET BY MOUTH EVERY 4 HOURS AS NEEDED FOR PAIN. Lamon Pillow, MD  Active   OXYGEN [id number] No Inhale 2 L into the lungs at bedtime as needed (for shortness of breath). [provider] Taking Active Pharmacy Records, Other  pantoprazole (PROTONIX) 40 MG tablet [id number]  TAKE 1 TABLET BY MOUTH 2 TIMES DAILY (30MINUTES BEFORE A MEAL) Lamon Pillow, MD  Active   ranolazine (RANEXA) 1000 MG SR tablet [id number] No Take 1 tablet (1,000 mg total) by mouth 2 (two) times daily. Aisha Hove, MD Taking Active Pharmacy Records, Other  sacubitril-valsartan [facility]) 24-26 [place] [id number] No Take 1 tablet by mouth 2 (two) times daily. [provider] Taking Active Self  sennosides-docusate sodium (SENOKOT-S) 8.6-50 MG tablet [id number] No Take 1 tablet by mouth daily. [provider] Taking Active Self  spironolactone (ALDACTONE) 25 MG tablet [id number]  Take 0.5 tablets (12.5 mg total) by mouth daily. Althia Atlas, MD  Active   sucralfate (CARAFATE) 1 GM/10ML suspension [id number] No Take 10 mLs (1 g total) by mouth 4 (four) times daily. Margery Sheets,  Kittie Perking, MD Taking Active   traZODone (DESYREL) 100 MG tablet [id number] No Take 1 tablet (100 mg total) by mouth at bedtime as needed for sleep. Lamon Pillow, MD Taking Active   venlafaxine [facility]) 25 MG tablet [id number]  TAKE 1 TABLET BY MOUTH DAILY Lamon Pillow, MD  Active   venlafaxine XR (EFFEXOR-XR) 75 MG 24 hr capsule [id number] No Take 75 mg by mouth daily with breakfast. [provider] Taking Active Pharmacy Records, Other             Recommendation:   PCP Follow-up on March 10, 2024. Consider referral to Neurologist to discuss concerns related to memory loss.  Follow Up Plan:   Telephone follow up appointment on Mar 21, 2024 at 1230.    Roxie Cord [facility] Population Health RN Care Manager Direct Dial: [phone]  Fax: [phone] Website: [email or web address]

## 2024-02-28 NOTE — Patient Instructions (Addendum)
 Thank you for allowing the Care Management team to participate in your care. It was great speaking with you today!  Reminders: I will contact [facility] to discuss the status of your Palliative Care referral. A message will be relayed to Dr. Shann Darnel regarding your request for a Neurology referral. Please continue to follow the safety and fall prevention strategies discussed today. Please remember to check your blood pressure and keep a log of the readings. Please remember to check your fasting blood sugars before you eat breakfast and keep a log of the readings.   Our next outreach is scheduled for Mar 21, 2024 at 12:30 pm. Please do not hesitate to contact me if you require assistance prior to our next outreach.   If you are experiencing a Mental Health or Behavioral Health Crisis you may: Call the Suicide and Crisis Lifeline: 988  Call 911    Roxie Cord [facility] Population Health RN Care Manager Direct Dial: [phone]  Fax: [phone] Website: [email or web address]

## 2024-03-01 ENCOUNTER — Other Ambulatory Visit: Payer: Self-pay | Admitting: Family Medicine

## 2024-03-01 ENCOUNTER — Ambulatory Visit: Payer: Self-pay

## 2024-03-01 NOTE — Telephone Encounter (Signed)
 Chief Complaint: nausea, abdominal pain Symptoms: nausea, medial/upper abdominal pain, worsening SOB Frequency: x 3-4 days, gradual and worsening Pertinent Negatives: Patient denies vomiting, bleeding, blood in stool, chest pain. Disposition: [x] ED /[] Urgent Care (no appt availability in office) / [] Appointment(In office/virtual)/ []  [place] [facility]/ [] Home Care/ [] Refused Recommended Disposition /[] [place] [facility]/ []  Follow-up with PCP Additional Notes: He states 4 months ago he was in the hospital and they did an EGD with colonoscopy. He states they found 2 ulcers. He is concerned if they are acting up again. He states he was placed on medication for acid reflux (Carafate) and has been compliant. He states they told him he was supposed to have a repeat EGD but they have not set it up yet. Patients pain is a severe constant abdominal pain accompanied with worsening SOB and nausea; advised ED and patient is agreeable to go.  Copied From CRM [id number]. Reason for Triage: nausea.. he almost thrown up twice.. [phone]  Reason for Disposition  [1] SEVERE pain (e.g., excruciating) AND [2] present > 1 hour  Answer Assessment - Initial Assessment Questions 1. NAUSEA SEVERITY: "How bad is the nausea?" (e.g., mild, moderate, severe; dehydration, weight loss)   - MILD: loss of appetite without change in eating habits   - MODERATE: decreased oral intake without significant weight loss, dehydration, or malnutrition   - SEVERE: inadequate caloric or fluid intake, significant weight loss, symptoms of dehydration     He states he has been able to eat some applesauce and drinking water. Moderate.  2. ONSET: "When did the nausea begin?"     X 2-3 days, worsening.  3. VOMITING: "Any vomiting?" If Yes, ask: "How many times today?"     Denies, states he is close to it.  4. RECURRENT SYMPTOM: "Have you had nausea before?" If Yes, ask: "When was the last time?" "What happened that time?"      Yes, he states last time was about a year ago. He states then he got a hold of Dr Sherrie Mustache who prescribed some nausea pills.  5. CAUSE: "What do you think is causing the nausea?"     He states he has no clue.  6. PREGNANCY: "Is there any chance you are pregnant?" (e.g., unprotected intercourse, missed birth control pill, broken condom)     N/A.  Answer Assessment - Initial Assessment Questions 1. LOCATION: "Where does it hurt?"      He states just under his left ribs but then describes it as "middle".  2. RADIATION: "Does the pain shoot anywhere else?" (e.g., chest, back)     Denies.  3. ONSET: "When did the pain begin?" (e.g., minutes, hours or days ago)      X 3-4 days.  4. SUDDEN: "Gradual or sudden onset?"     Gradual.  5. PATTERN "Does the pain come and go, or is it constant?"    - If it comes and goes: "How long does it last?" "Do you have pain now?"     (Note: Comes and goes means the pain is intermittent. It goes away completely between bouts.)    - If constant: "Is it getting better, staying the same, or getting worse?"      (Note: Constant means the pain never goes away completely; most serious pain is constant and gets worse.)      Constant, getting worse.  6. SEVERITY: "How bad is the pain?"  (e.g., Scale 1-10; mild, moderate, or severe)    - MILD (1-3):  Doesn't interfere with normal activities, abdomen soft and not tender to touch..     - MODERATE (4-7): Interferes with normal activities or awakens from sleep, abdomen tender to touch.     - SEVERE (8-10): Excruciating pain, doubled over, unable to do any normal activities.       9/10.  7. RECURRENT SYMPTOM: "Have you ever had this type of stomach pain before?" If Yes, ask: "When was the last time?" and "What happened that time?"      Yes, last time was stomach ulcers. He was hospitalized for them and placed on medication.  8. AGGRAVATING FACTORS: "Does anything seem to cause this pain?" (e.g., foods, stress, alcohol)      He states when eating regular food that makes it worse.  9. CARDIAC SYMPTOMS: "Do you have any of the following symptoms: chest pain, difficulty breathing, sweating, nausea?"     Nausea, he states he has COPD and baseline has SOB and worsened, he also states his BP was 158/100 this morning.  Protocols used: Nausea-A-AH, Abdominal Pain - Upper-A-AH

## 2024-03-03 ENCOUNTER — Telehealth: Payer: Self-pay

## 2024-03-03 NOTE — Transitions of Care (Post Inpatient/ED Visit) (Signed)
 03/03/2024  Name: Lawrence [name] MRN: [id number] DOB: 01-Jul-1956  Today's TOC FU Call Status: TOC FU Call Complete Date: 03/03/24 Patient's Name and Date of Birth confirmed.  Transition Care Management Follow-up Telephone Call Date of Discharge: 03/02/24 Discharge Facility: Other [facility]) Name of Other ([facility]) Discharge Facility: [facility] Type of Discharge: Emergency Department Reason for ED Visit: Other: (epigastric pain) How have you been since you were released from the hospital?: Better Any questions or concerns?: No  Items Reviewed: Did you receive and understand the discharge instructions provided?: Yes Medications obtained,verified, and reconciled?: Yes (Medications Reviewed) Any new allergies since your discharge?: No Dietary orders reviewed?: Yes Do you have support at home?: Yes People in Home [RPT]: grandchild(ren)  Medications Reviewed Today: Medications Reviewed Today     Reviewed by Darrall Ellison, LPN (Licensed Practical Nurse) on 03/03/24 at [id number]  Med List Status: <None>   Medication Order Taking? Sig Documenting Provider Last Dose Status Informant  Accu-Chek Softclix Lancets lancets [id number] No Use as instructed to check sugar daily for type 2 diabetes. Lamon Pillow, MD Taking Active Pharmacy Records, Other  allopurinol  (ZYLOPRIM ) 300 MG tablet [id number] No Take 300 mg by mouth 2 (two) times daily. [provider] Taking Active   ALPRAZolam  (XANAX ) 1 MG tablet [id number] No Take 1 tablet (1 mg total) by mouth 3 (three) times daily as needed. Lamon Pillow, MD Taking Active Pharmacy Records, Other  amLODipine  (NORVASC ) 10 MG tablet [id number] No Take 10 mg by mouth daily. [provider] Taking Active Self  apixaban  (ELIQUIS ) 5 MG TABS tablet [id number] No Take 1 tablet (5 mg total) by mouth 2 (two) times daily. Lamon Pillow, MD Taking Active Pharmacy Records, Other  atorvastatin  (LIPITOR ) 80 MG tablet [id number] No TAKE 1  TABLET BY MOUTH AT BEDTIME Lamon Pillow, MD Taking Active Pharmacy Records, Other  Blood Glucose Calibration (ACCU-CHEK GUIDE CONTROL) LIQD [id number] No Use with blood glucose monitor as directed Lamon Pillow, MD Taking Active   Blood Glucose Monitoring Suppl (ACCU-CHEK GUIDE) w/Device KIT [id number] No Use to check blood sugars as directed Lamon Pillow, MD Taking Active   Budeson-Glycopyrrol-Formoterol  (BREZTRI  AEROSPHERE) 160-9-4.8 MCG/ACT AERO [id number] No Inhale 2 puffs into the lungs 2 (two) times daily. Lamon Pillow, MD Taking Active Pharmacy Records, Other  carvedilol  (COREG ) 3.125 MG tablet [id number]  Take 3.125 mg by mouth 2 (two) times daily with a meal. [provider]  Active   carvedilol  (COREG ) 3.125 MG tablet [id number]  TAKE 1 TABLET BY MOUTH TWICE A DAY Lamon Pillow, MD  Active   cetirizine  (ZYRTEC ) 10 MG tablet [id number] No TAKE 1 TABLET BY MOUTH AT BEDTIME Lamon Pillow, MD Taking Active Pharmacy Records, Other           Med Note (BEJOS, ANJA J   Mon Oct 18, 2023 10:05 PM) Last filled 4D supply in June  Cyanocobalamin  (VITAMIN B-12) 1000 MCG SUBL [id number] No Place 1 tablet under the tongue daily. [provider] Taking Active Pharmacy Records, Other  FARXIGA  10 MG TABS tablet [id number]  TAKE 1 TABLET BY MOUTH DAILY Lamon Pillow, MD  Active   gabapentin  (NEURONTIN ) 300 MG capsule [id number]  Take 1 capsule at bedtime for 1 week, then increase to 1 twice a day Lamon Pillow, MD  Active   glucose blood (ACCU-CHEK GUIDE) test strip [id number] No Use as instructed to check sugar daily for type 2 diabetes. Lamon Pillow, MD Taking  Active Pharmacy Records, Other  hydrOXYzine  (VISTARIL ) 25 MG capsule [id number] No Take 1 capsule (25 mg total) by mouth 3 (three) times daily as needed. Lubertha Rush, MD Taking Active   iron  polysaccharides (FERREX 150) 150 MG capsule [id number] No TAKE 1 CAPSULE BY MOUTH DAILY Lamon Pillow, MD Taking Active  Pharmacy Records, Other  isosorbide  mononitrate (IMDUR ) 120 MG 24 hr tablet [id number]  Take 1 tablet (120 mg total) by mouth daily. Althia Atlas, MD  Active   lidocaine  (LIDODERM ) 5 % [id number] No Place 1 patch onto the skin daily. Remove & Discard patch within 12 hours or as directed by MD Luna Salinas, MD Taking Active Pharmacy Records, Other  metFORMIN  (GLUCOPHAGE -XR) 500 MG 24 hr tablet [id number]  TAKE 1 TABLET BY MOUTH DAILY WITH EVENING MEAL Lamon Pillow, MD  Active   methocarbamol  (ROBAXIN ) 500 MG tablet [id number]  TAKE 1 TABLET BY MOUTH EVERY 8 HOURS AS NEEDED FOR MUSCLE SPASMS Lamon Pillow, MD  Active   nitroGLYCERIN  (NITROSTAT ) 0.4 MG SL tablet [id number] No Place 1 tablet (0.4 mg total) under the tongue every 5 (five) minutes as needed for chest pain (Do not take if blood pressure is low, systolic BP<110). Sheril Dines, MD Taking Active   omega-3 acid ethyl esters (LOVAZA ) 1 g capsule [id number]  TAKE 4 CAPSULES (4 GRAMS TOTAL) BY Suzzanna Estimable, MD  Active   oxyCODONE -acetaminophen  (PERCOCET) 10-325 MG tablet [id number]  TAKE 1 TABLET BY MOUTH EVERY 4 HOURS AS NEEDED FOR PAIN Lamon Pillow, MD  Active   OXYGEN  [id number] No Inhale 2 L into the lungs at bedtime as needed (for shortness of breath). [provider] Taking Active Pharmacy Records, Other  pantoprazole  (PROTONIX ) 40 MG tablet [id number]  TAKE 1 TABLET BY MOUTH 2 TIMES DAILY (30MINUTES BEFORE A MEAL) Lamon Pillow, MD  Active   ranolazine  (RANEXA ) 1000 MG SR tablet [id number] No Take 1 tablet (1,000 mg total) by mouth 2 (two) times daily. Aisha Hove, MD Taking Active Pharmacy Records, Other  sacubitril -valsartan  (ENTRESTO ) 24-26 MG [id number] No Take 1 tablet by mouth 2 (two) times daily. [provider] Taking Active Self  sennosides-docusate sodium  (SENOKOT-S) 8.6-50 MG tablet [id number] No Take 1 tablet by mouth daily. [provider] Taking Active Self  spironolactone   (ALDACTONE ) 25 MG tablet [id number]  Take 0.5 tablets (12.5 mg total) by mouth daily. Althia Atlas, MD  Active   sucralfate  (CARAFATE ) 1 GM/10ML suspension [id number] No Take 10 mLs (1 g total) by mouth 4 (four) times daily. Buell Carmin, MD Taking Active   traZODone  (DESYREL ) 100 MG tablet [id number] No Take 1 tablet (100 mg total) by mouth at bedtime as needed for sleep. Lamon Pillow, MD Taking Active   venlafaxine  (EFFEXOR ) 25 MG tablet [id number]  TAKE 1 TABLET BY MOUTH DAILY Shann Darnel Erlinda Haws, MD  Active   venlafaxine  XR (EFFEXOR -XR) 75 MG 24 hr capsule [id number] No Take 75 mg by mouth daily with breakfast. [provider] Taking Active Pharmacy Records, Other            Home Care and Equipment/Supplies: Were Home Health Services Ordered?: NA Any new equipment or medical supplies ordered?: NA  Functional Questionnaire: Do you need assistance with bathing/showering or dressing?: No Do you need assistance with meal preparation?: No Do you need assistance with eating?: No Do you have difficulty maintaining continence: No Do you need assistance with getting out of bed/getting out of a chair/moving?: No Do you  have difficulty managing or taking your medications?: No  Follow up appointments reviewed: PCP Follow-up appointment confirmed?: Yes MD Provider Line Number:[phone] Given: No Date of PCP follow-up appointment?: 03/10/24 Follow-up Provider: [facility] Follow-up appointment confirmed?: NA Do you need transportation to your follow-up appointment?: No Do you understand care options if your condition(s) worsen?: Yes-patient verbalized understanding    SIGNATURE Darrall Ellison, LPN [facility] Nurse Health Advisor Direct Dial [phone]

## 2024-03-09 ENCOUNTER — Telehealth: Payer: Self-pay | Admitting: *Deleted

## 2024-03-09 ENCOUNTER — Encounter: Payer: Self-pay | Admitting: *Deleted

## 2024-03-09 ENCOUNTER — Encounter: Payer: Self-pay | Admitting: Family Medicine

## 2024-03-10 ENCOUNTER — Ambulatory Visit (INDEPENDENT_AMBULATORY_CARE_PROVIDER_SITE_OTHER): Payer: Self-pay | Admitting: Family Medicine

## 2024-03-10 ENCOUNTER — Encounter: Payer: Self-pay | Admitting: Family Medicine

## 2024-03-10 VITALS — BP 150/96 | HR 61 | Resp 16 | Wt 174.0 lb

## 2024-03-10 DIAGNOSIS — F324 Major depressive disorder, single episode, in partial remission: Secondary | ICD-10-CM

## 2024-03-10 DIAGNOSIS — M792 Neuralgia and neuritis, unspecified: Secondary | ICD-10-CM

## 2024-03-10 DIAGNOSIS — G4733 Obstructive sleep apnea (adult) (pediatric): Secondary | ICD-10-CM

## 2024-03-10 DIAGNOSIS — R413 Other amnesia: Secondary | ICD-10-CM | POA: Diagnosis not present

## 2024-03-10 DIAGNOSIS — Z125 Encounter for screening for malignant neoplasm of prostate: Secondary | ICD-10-CM

## 2024-03-10 DIAGNOSIS — I5042 Chronic combined systolic (congestive) and diastolic (congestive) heart failure: Secondary | ICD-10-CM | POA: Diagnosis not present

## 2024-03-10 DIAGNOSIS — I10 Essential (primary) hypertension: Secondary | ICD-10-CM | POA: Diagnosis not present

## 2024-03-10 MED ORDER — SPIRONOLACTONE 25 MG PO TABS: 25.0000 mg | ORAL_TABLET | Freq: Every day | ORAL | 2 refills | Status: DC

## 2024-03-10 NOTE — Patient Instructions (Addendum)
 Please review the attached list of medications and notify my office if there are any errors.   Please bring all of your medications to every appointment so we can make sure that our medication list is the same as yours.   You can stop taking venlafaxine 

## 2024-03-11 ENCOUNTER — Emergency Department
Admission: EM | Admit: 2024-03-11 | Discharge: 2024-03-12 | Disposition: A | Discharge status: Home / Self Care | Attending: Emergency Medicine | Admitting: Emergency Medicine

## 2024-03-11 ENCOUNTER — Emergency Department

## 2024-03-11 ENCOUNTER — Other Ambulatory Visit: Payer: Self-pay

## 2024-03-11 DIAGNOSIS — I4891 Unspecified atrial fibrillation: Secondary | ICD-10-CM | POA: Diagnosis not present

## 2024-03-11 DIAGNOSIS — K625 Hemorrhage of anus and rectum: Secondary | ICD-10-CM | POA: Diagnosis not present

## 2024-03-11 DIAGNOSIS — R1032 Left lower quadrant pain: Secondary | ICD-10-CM | POA: Insufficient documentation

## 2024-03-11 DIAGNOSIS — R110 Nausea: Secondary | ICD-10-CM | POA: Insufficient documentation

## 2024-03-11 DIAGNOSIS — R1031 Right lower quadrant pain: Secondary | ICD-10-CM | POA: Diagnosis not present

## 2024-03-11 DIAGNOSIS — Z7901 Long term (current) use of anticoagulants: Secondary | ICD-10-CM | POA: Diagnosis not present

## 2024-03-11 DIAGNOSIS — K2900 Acute gastritis without bleeding: Secondary | ICD-10-CM

## 2024-03-11 DIAGNOSIS — Z955 Presence of coronary angioplasty implant and graft: Secondary | ICD-10-CM | POA: Diagnosis not present

## 2024-03-11 DIAGNOSIS — R1010 Upper abdominal pain, unspecified: Secondary | ICD-10-CM

## 2024-03-11 LAB — COMPREHENSIVE METABOLIC PANEL WITH GFR
ALT: 12 U/L (ref 0–44)
AST: 17 U/L (ref 15–41)
Albumin: 3.9 g/dL (ref 3.5–5.0)
Alkaline Phosphatase: 67 U/L (ref 38–126)
Anion gap: 11 (ref 5–15)
BUN: 23 mg/dL (ref 8–23)
CO2: 25 mmol/L (ref 22–32)
Calcium: 9.5 mg/dL (ref 8.9–10.3)
Chloride: 101 mmol/L (ref 98–111)
Creatinine, Ser: 0.89 mg/dL (ref 0.61–1.24)
GFR, Estimated: >60 mL/min (ref >60)
Glucose, Bld: 154 mg/dL — ABNORMAL HIGH (ref 70–99)
Potassium: 3.6 mmol/L (ref 3.5–5.1)
Sodium: 137 mmol/L (ref 135–145)
Total Bilirubin: 0.9 mg/dL (ref 0.0–1.2)
Total Protein: 6.8 g/dL (ref 6.5–8.1)

## 2024-03-11 LAB — CBC WITH DIFFERENTIAL/PLATELET
Abs Immature Granulocytes: 0.02 10*3/uL (ref 0.00–0.07)
Basophils Absolute: 0 10*3/uL (ref 0.0–0.1)
Basophils Relative: 0 %
Eosinophils Absolute: 0.2 10*3/uL (ref 0.0–0.5)
Eosinophils Relative: 2 %
HCT: 51.6 % (ref 39.0–52.0)
Hemoglobin: 16.6 g/dL (ref 13.0–17.0)
Immature Granulocytes: 0 %
Lymphocytes Relative: 20 %
Lymphs Abs: 1.4 10*3/uL (ref 0.7–4.0)
MCH: 28.1 pg (ref 26.0–34.0)
MCHC: 32.2 g/dL (ref 30.0–36.0)
MCV: 87.3 fL (ref 80.0–100.0)
Monocytes Absolute: 0.4 10*3/uL (ref 0.1–1.0)
Monocytes Relative: 6 %
Neutro Abs: 5.1 10*3/uL (ref 1.7–7.7)
Neutrophils Relative %: 72 %
Platelets: 181 10*3/uL (ref 150–400)
RBC: 5.91 MIL/uL — ABNORMAL HIGH (ref 4.22–5.81)
RDW: 15.2 % (ref 11.5–15.5)
WBC: 7.1 10*3/uL (ref 4.0–10.5)
nRBC: 0 % (ref 0.0–0.2)

## 2024-03-11 LAB — PSA TOTAL (REFLEX TO FREE): Prostate Specific Ag, Serum: 1.6 ng/mL (ref 0.0–4.0)

## 2024-03-11 LAB — TYPE AND SCREEN
ABO/RH(D): A POS
Antibody Screen: NEGATIVE

## 2024-03-11 LAB — LACTIC ACID, PLASMA: Lactic Acid, Venous: 1.9 mmol/L (ref 0.5–1.9)

## 2024-03-11 MED ORDER — IOHEXOL 300 MG/ML  SOLN
100.0000 mL | Freq: Once | INTRAMUSCULAR | Status: AC | PRN
  Administered 2024-03-11: 100 mL via INTRAVENOUS

## 2024-03-11 MED ORDER — ONDANSETRON HCL 4 MG/2ML IJ SOLN
4.0000 mg | Freq: Once | INTRAMUSCULAR | Status: AC
  Administered 2024-03-11: 4 mg via INTRAVENOUS
  Filled 2024-03-11: qty 2

## 2024-03-11 MED ORDER — FENTANYL CITRATE PF 50 MCG/ML IJ SOSY
50.0000 ug | PREFILLED_SYRINGE | Freq: Once | INTRAMUSCULAR | Status: AC
  Administered 2024-03-11: 50 ug via INTRAVENOUS
  Filled 2024-03-11: qty 1

## 2024-03-11 MED ORDER — SODIUM CHLORIDE 0.9 % IV BOLUS
1000.0000 mL | Freq: Once | INTRAVENOUS | Status: AC
  Administered 2024-03-11: 1000 mL via INTRAVENOUS

## 2024-03-11 NOTE — ED Triage Notes (Signed)
 Pt c/o rectal bleeding, abd pain and nause x3 days.  Pt saw PCP and was told to come to ER if symptoms became intolerable.

## 2024-03-11 NOTE — ED Notes (Signed)
 Pt assisted with repositioning in bed, warm blanket, and pillow per his request.

## 2024-03-11 NOTE — ED Provider Notes (Signed)
 [facility] Provider Note   Event Date/Time   First MD Initiated Contact with Patient 03/11/24 2145     (approximate) History  Abdominal Pain and Weakness (Pt c/o rectal bleeding, abd pain and nause x3 days.  Pt saw PCP and was told to come to ER if symptoms were intolerable.)  HPI Lawrence [name] is a 68 y.o. 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  Patient was told by his primary care physician to come into the emergency department if he was unable to wait until Monday for his test results.  Patient is concerned as he is continuing to take his anticoagulation medicine.  Patient also endorses nausea without vomiting over the last 3 days as well. ROS: Patient currently denies any vision changes, tinnitus, difficulty speaking, facial droop, sore throat, chest pain, shortness of breath, vomiting/diarrhea, dysuria, or weakness/numbness/paresthesias in any extremity   Physical Exam  Triage Vital Signs: ED Triage Vitals  Encounter Vitals Group     BP      Systolic BP Percentile      Diastolic BP Percentile      Pulse      Resp      Temp      Temp src      SpO2      Weight      Height      Head Circumference      Peak Flow      Pain Score      Pain Loc      Pain Education      Exclude from Growth Chart    Most recent vital signs: Vitals:   03/11/24 2207  BP: (!) 125/91  Pulse: (!) 104  Resp: 18  Temp: 98.2 F (36.8 C)  SpO2: 93%   General: Awake, oriented x4. CV:  Good peripheral perfusion.  Resp:  Normal effort.  Abd:  No distention.  Other:  Elderly well-developed, well-nourished Caucasian male resting comfortably in no acute distress ED Results / Procedures / Treatments  Labs (all labs ordered are listed, but only abnormal results are displayed) Labs Reviewed  COMPREHENSIVE METABOLIC PANEL WITH GFR - Abnormal;  Notable for the following components:      Result Value   Glucose, Bld 154 (*)    All other components within normal limits  CBC WITH DIFFERENTIAL/PLATELET - Abnormal; Notable for the following components:   RBC 5.91 (*)    All other components within normal limits  LACTIC ACID, PLASMA  LACTIC ACID, PLASMA  TYPE AND SCREEN    RADIOLOGY ED MD interpretation: Pending -Agree with radiology assessment Official radiology report(s): No results found. PROCEDURES: Critical Care performed: No Procedures MEDICATIONS ORDERED IN ED: Medications  ondansetron  (ZOFRAN ) injection 4 mg (4 mg Intravenous Given 03/11/24 2227)  fentaNYL  (SUBLIMAZE ) injection 50 mcg (50 mcg Intravenous Given 03/11/24 2227)  sodium chloride  0.9 % bolus 1,000 mL (1,000 mLs Intravenous New Bag/Given 03/11/24 2228)  iohexol  (OMNIPAQUE ) 300 MG/ML solution 100 mL (100 mLs Intravenous Contrast Given 03/11/24 2309)   IMPRESSION / MDM / ASSESSMENT AND PLAN / ED COURSE  I reviewed the triage vital signs and the nursing notes.                             The patient is on the cardiac monitor to evaluate for evidence  of arrhythmia and/or significant heart rate changes. Patient's presentation is most consistent with acute presentation with potential threat to life or bodily function. Patient presents for abdominal pain.  Differential diagnosis includes appendicitis, abdominal aortic aneurysm, surgical biliary disease, pancreatitis, SBO, mesenteric ischemia, serious intra-abdominal bacterial illness, genital torsion. Doubt atypical ACS. Care of this patient will be signed out to the oncoming physician at the end of my shift.  All pertinent patient information conveyed and all questions answered.  All further care and disposition decisions will be made by the oncoming physician.   FINAL CLINICAL IMPRESSION(S) / ED DIAGNOSES   Final diagnoses:  None   Rx / DC Orders   ED Discharge Orders     None      Note:  This document  was prepared using Dragon voice recognition software and may include unintentional dictation errors.   Samier Jaco K, MD 03/11/24 [phone]

## 2024-03-12 DIAGNOSIS — R1031 Right lower quadrant pain: Secondary | ICD-10-CM | POA: Diagnosis not present

## 2024-03-12 MED ORDER — METOCLOPRAMIDE HCL 10 MG PO TABS
10.0000 mg | ORAL_TABLET | Freq: Three times a day (TID) | ORAL | 0 refills | Status: DC
Start: 2024-03-12 — End: 2024-06-13

## 2024-03-12 MED ORDER — MORPHINE SULFATE (PF) 4 MG/ML IV SOLN
4.0000 mg | Freq: Once | INTRAVENOUS | Status: AC
  Administered 2024-03-12: 4 mg via INTRAVENOUS
  Filled 2024-03-12: qty 1

## 2024-03-12 MED ORDER — ALUM & MAG HYDROXIDE-SIMETH 200-200-20 MG/5ML PO SUSP
30.0000 mL | Freq: Once | ORAL | Status: AC
  Administered 2024-03-12: 30 mL via ORAL
  Filled 2024-03-12: qty 30

## 2024-03-12 MED ORDER — FAMOTIDINE 20 MG PO TABS
40.0000 mg | ORAL_TABLET | Freq: Once | ORAL | Status: AC
  Administered 2024-03-12: 40 mg via ORAL
  Filled 2024-03-12: qty 2

## 2024-03-12 MED ORDER — FAMOTIDINE 20 MG PO TABS: 20.0000 mg | ORAL_TABLET | Freq: Two times a day (BID) | ORAL | 0 refills | Status: DC

## 2024-03-12 MED ORDER — METOCLOPRAMIDE HCL 10 MG PO TABS
10.0000 mg | ORAL_TABLET | Freq: Once | ORAL | Status: AC
  Administered 2024-03-12: 10 mg via ORAL
  Filled 2024-03-12: qty 1

## 2024-03-12 MED ORDER — ALUMINUM-MAGNESIUM-SIMETHICONE 200-200-20 MG/5ML PO SUSP: 30.0000 mL | Freq: Three times a day (TID) | ORAL | 0 refills | Status: DC

## 2024-03-12 NOTE — ED Provider Notes (Signed)
 Procedures     ----------------------------------------- 12:46 AM on 03/12/2024 ----------------------------------------- Labs and CT are unremarkable.  Now reporting epigastric pain, having some tenderness in this area.  He does report a history of peptic ulcer disease along with well-formed black stool.  Performed a rectal exam which is Hemoccult negative, no melena in the rectum.  Will give antacids, if symptoms are manageable I think he can follow-up with gastroenterology outpatient.   ----------------------------------------- 2:22 AM on 03/12/2024 ----------------------------------------- Feeling better after antacids.  Tolerating oral intake.  Stable for discharge    Jacquie Maudlin, MD 03/12/24 Madge Schiller

## 2024-03-13 ENCOUNTER — Telehealth: Payer: Self-pay | Admitting: Family Medicine

## 2024-03-13 NOTE — Telephone Encounter (Signed)
 Copied from CRM [id number]. Topic: General - Other >> Mar 13, 2024  1:05 PM Lawrence [name] S wrote: Reason for CRM: Pt would like a cb [phone] regarding ordering a hospital bed for himself

## 2024-03-14 ENCOUNTER — Ambulatory Visit (INDEPENDENT_AMBULATORY_CARE_PROVIDER_SITE_OTHER): Payer: Medicare HMO

## 2024-03-14 DIAGNOSIS — E119 Type 2 diabetes mellitus without complications: Secondary | ICD-10-CM

## 2024-03-14 DIAGNOSIS — Z0000 Encounter for general adult medical examination without abnormal findings: Secondary | ICD-10-CM

## 2024-03-14 NOTE — Patient Instructions (Addendum)
 [name] , Thank you for taking time to come for your Medicare Wellness Visit. I appreciate your ongoing commitment to your health goals. Please review the following plan we discussed and let me know if I can assist you in the future.   Referrals/Orders/Follow-Ups/Clinician Recommendations: NONE  This is a list of the screening recommended for you and due dates:  Health Maintenance  Topic Date Due   Zoster (Shingles) Vaccine (2 of 2) 10/08/2017   Eye exam for diabetics  02/24/2019   COVID-19 Vaccine (4 - 2024-25 season) 07/18/2023   Flu Shot  06/16/2024   Yearly kidney health urinalysis for diabetes  07/20/2024   Screening for Lung Cancer  07/24/2024   Hemoglobin A1C  08/03/2024   Yearly kidney function blood test for diabetes  03/11/2025   Medicare Annual Wellness Visit  03/14/2025   DTaP/Tdap/Td vaccine (4 - Td or Tdap) 09/01/2027   Colon Cancer Screening  04/30/2028   Pneumonia Vaccine  Completed   Hepatitis C Screening  Completed   HPV Vaccine  Aged Out   Meningitis B Vaccine  Aged Out    Advanced directives: (ACP Link)Information on Advanced Care Planning can be found at [place]  Best boy Advance Health Care Directives Advance Health Care Directives. [email or web address]   Next Medicare Annual Wellness Visit scheduled for next year: Yes  03/20/25 @ 3:50 PM BY PHONE

## 2024-03-14 NOTE — Telephone Encounter (Signed)
 Copied from CRM [id number]. Topic: General - Other >> Mar 13, 2024  4:08 PM Yolanda T wrote: Reason for CRM: patient called back to f/u on his previous call about ordering a hospital bed as he keeps falling out of his bed

## 2024-03-14 NOTE — Progress Notes (Unsigned)
 Subjective:   Lawrence [name] is a 68 y.o. who presents for a Medicare Wellness preventive visit.  Visit Complete: Virtual I connected with  Lawrence [name] on 03/14/24 by a audio enabled telemedicine application and verified that I am speaking with the correct person using two identifiers.  Patient Location: Home  Provider Location: Office/Clinic  I discussed the limitations of evaluation and management by telemedicine. The patient expressed understanding and agreed to proceed.  Vital Signs: Because this visit was a virtual/telehealth visit, some criteria may be missing or patient reported. Any vitals not documented were not able to be obtained and vitals that have been documented are patient reported.  VideoDeclined- This patient declined Librarian, academic. Therefore the visit was completed with audio only.  Persons Participating in Visit: Patient.  AWV Questionnaire: No: Patient Medicare AWV questionnaire was not completed prior to this visit.  Cardiac Risk Factors include: advanced age (>68men, >68 women);diabetes mellitus;dyslipidemia;hypertension;male gender;obesity (BMI >30kg/m2)     Objective:    Today's Vitals   03/14/24 1603  PainSc: 0-No pain   There is no height or weight on file to calculate BMI.     03/14/2024    4:09 PM 03/11/2024   10:14 PM 02/28/2024    6:34 PM 02/18/2024    1:54 AM 02/09/2024    8:36 AM 02/08/2024    5:49 PM 01/26/2024   10:33 PM  Advanced Directives  Does Patient Have a Medical Advance Directive? Yes Yes No No No No No  Type of Estate agent of [place];Living will Out of facility DNR (pink MOST or yellow form)       Does patient want to make changes to medical advance directive?  No - Patient declined       Copy of Healthcare Power of Attorney in Chart? No - copy requested        Would patient like information on creating a medical advance directive? No - Patient declined  No - Patient  declined No - Patient declined No - Patient declined      Current Medications (verified) Outpatient Encounter Medications as of 03/14/2024  Medication Sig   Accu-Chek Softclix Lancets lancets Use as instructed to check sugar daily for type 2 diabetes.   allopurinol  (ZYLOPRIM ) 300 MG tablet Take 300 mg by mouth 2 (two) times daily.   ALPRAZolam  (XANAX ) 1 MG tablet Take 1 tablet (1 mg total) by mouth 3 (three) times daily as needed.   aluminum-magnesium  hydroxide-simethicone  (MAALOX) 200-200-20 MG/5ML SUSP Take 30 mLs by mouth 4 (four) times daily -  before meals and at bedtime.   amLODipine  (NORVASC ) 10 MG tablet Take 10 mg by mouth daily.   apixaban  (ELIQUIS ) 5 MG TABS tablet Take 1 tablet (5 mg total) by mouth 2 (two) times daily.   atorvastatin  (LIPITOR ) 80 MG tablet TAKE 1 TABLET BY MOUTH AT BEDTIME   Blood Glucose Calibration (ACCU-CHEK GUIDE CONTROL) LIQD Use with blood glucose monitor as directed   Blood Glucose Monitoring Suppl (ACCU-CHEK GUIDE) w/Device KIT Use to check blood sugars as directed   Budeson-Glycopyrrol-Formoterol  (BREZTRI  AEROSPHERE) 160-9-4.8 MCG/ACT AERO Inhale 2 puffs into the lungs 2 (two) times daily.   carvedilol  (COREG ) 3.125 MG tablet Take 3.125 mg by mouth 2 (two) times daily with a meal.   carvedilol  (COREG ) 3.125 MG tablet TAKE 1 TABLET BY MOUTH TWICE A DAY   cetirizine  (ZYRTEC ) 10 MG tablet TAKE 1 TABLET BY MOUTH AT BEDTIME   Cyanocobalamin  (VITAMIN B-12)  1000 MCG SUBL Place 1 tablet under the tongue daily.   famotidine  (PEPCID ) 20 MG tablet Take 1 tablet (20 mg total) by mouth 2 (two) times daily for 15 days.   FARXIGA  10 MG TABS tablet TAKE 1 TABLET BY MOUTH DAILY   gabapentin  (NEURONTIN ) 300 MG capsule Take 1 capsule at bedtime for 1 week, then increase to 1 twice a day   glucose blood (ACCU-CHEK GUIDE) test strip Use as instructed to check sugar daily for type 2 diabetes.   hydrOXYzine  (VISTARIL ) 25 MG capsule Take 1 capsule (25 mg total) by mouth 3  (three) times daily as needed.   iron  polysaccharides (FERREX 150) 150 MG capsule TAKE 1 CAPSULE BY MOUTH DAILY   isosorbide  mononitrate (IMDUR ) 120 MG 24 hr tablet Take 1 tablet (120 mg total) by mouth daily.   lidocaine  (LIDODERM ) 5 % Place 1 patch onto the skin daily. Remove & Discard patch within 12 hours or as directed by MD   metFORMIN  (GLUCOPHAGE -XR) 500 MG 24 hr tablet TAKE 1 TABLET BY MOUTH DAILY WITH EVENING MEAL   methocarbamol  (ROBAXIN ) 500 MG tablet TAKE 1 TABLET BY MOUTH EVERY 8 HOURS AS NEEDED FOR MUSCLE SPASMS   metoCLOPramide  (REGLAN ) 10 MG tablet Take 1 tablet (10 mg total) by mouth 4 (four) times daily -  before meals and at bedtime for 15 days.   nitroGLYCERIN  (NITROSTAT ) 0.4 MG SL tablet Place 1 tablet (0.4 mg total) under the tongue every 5 (five) minutes as needed for chest pain (Do not take if blood pressure is low, systolic BP<110).   omega-3 acid ethyl esters (LOVAZA ) 1 g capsule TAKE 4 CAPSULES (4 GRAMS TOTAL) BY MOUTHDAILY   oxyCODONE -acetaminophen  (PERCOCET) 10-325 MG tablet TAKE 1 TABLET BY MOUTH EVERY 4 HOURS AS NEEDED FOR PAIN   OXYGEN  Inhale 2 L into the lungs at bedtime as needed (for shortness of breath).   pantoprazole  (PROTONIX ) 40 MG tablet TAKE 1 TABLET BY MOUTH 2 TIMES DAILY (30MINUTES BEFORE A MEAL)   ranolazine  (RANEXA ) 1000 MG SR tablet Take 1 tablet (1,000 mg total) by mouth 2 (two) times daily.   sacubitril -valsartan  (ENTRESTO ) 24-26 MG Take 1 tablet by mouth 2 (two) times daily.   sennosides-docusate sodium  (SENOKOT-S) 8.6-50 MG tablet Take 1 tablet by mouth daily.   spironolactone  (ALDACTONE ) 25 MG tablet Take 1 tablet (25 mg total) by mouth daily.   sucralfate  (CARAFATE ) 1 GM/10ML suspension Take 10 mLs (1 g total) by mouth 4 (four) times daily.   traZODone  (DESYREL ) 100 MG tablet Take 1 tablet (100 mg total) by mouth at bedtime as needed for sleep.   No facility-administered encounter medications on file as of 03/14/2024.    Allergies  (verified) Prednisone , Demerol  [meperidine hcl], Demerol [meperidine], Sulfa antibiotics, Albuterol  sulfate [albuterol ], Empagliflozin , and Morphine  sulfate   History: Past Medical History:  Diagnosis Date   A-fib (HCC)    Anemia    Anginal pain (HCC)    Anxiety    Arthritis    Asthma    CAD (coronary artery disease)    a. 2002 CABGx2 (LIMA->LAD, VG->VG->OM1);  b. 09/2012 DES->OM;  c. 03/2015 PTCA of LAD [facility] in setting of atretic LIMA; d. 05/2015 Cath [facility]): nonobs dzs; e. 06/2015 Cath ([facility]): LM nl, LAD 45p/d ISR, 50d, D1/2 small, LCX 50p/d ISR, OM1 70ost, 30 ISR, VG->OM1 50ost, 19m, LIMA->LAD 99p/d - atretic, RCA dom, nl; f.cath 10/16: 40-50%(FFR 0.90) pLAD, 75% (FFR 0.77) mLAD s/p PCI/DES, oRCA 40% (FFR0.95)   Cancer (HCC)  SKIN CANCER ON BACK   Celiac disease    Chronic diastolic CHF (congestive heart failure) (HCC)    a. 06/2009 Echo: EF 60-65%, Gr 1 DD, triv AI, mildly dil LA, nl RV.   COPD (chronic obstructive pulmonary disease) (HCC)    a. Chronic bronchitis and emphysema.   DDD (degenerative disc disease), lumbar    Diverticulosis    Dysrhythmia    Essential hypertension    GERD (gastroesophageal reflux disease)    History of hiatal hernia    History of kidney stones    H/O   History of tobacco abuse    a. Quit 2014.   Myocardial infarction [facility]) 2002   4 STENTS   Pancreatitis    PSVT (paroxysmal supraventricular tachycardia) (HCC)    a. 10/2012 Noted on Zio Patch.   Sleep apnea    LOST CORD TO CPAP -ONLY 02 @ BEDTIME   Tubular adenoma of colon    Type II diabetes mellitus (HCC)    Past Surgical History:  Procedure Laterality Date   BYPASS GRAFT     CARDIAC CATHETERIZATION N/A 07/12/2015   rocedure: Left Heart Cath and Cors/Grafts Angiography;  Surgeon: Arty Binning, MD;  Location: [facility] INVASIVE CV LAB;  Service: Cardiovascular;  Laterality: N/A;   CARDIAC CATHETERIZATION Right 10/07/2015   Procedure: Left Heart Cath and Cors/Grafts Angiography;  Surgeon:  Cherrie Cornwall, MD;  Location: [facility] INVASIVE CV LAB;  Service: Cardiovascular;  Laterality: Right;   CARDIAC CATHETERIZATION N/A 04/06/2016   Procedure: Left Heart Cath and Coronary Angiography;  Surgeon: Antonette Batters, MD;  Location: [facility] INVASIVE CV LAB;  Service: Cardiovascular;  Laterality: N/A;   CARDIAC CATHETERIZATION  04/06/2016   Procedure: Bypass Graft Angiography;  Surgeon: Antonette Batters, MD;  Location: [facility] INVASIVE CV LAB;  Service: Cardiovascular;;   CARDIAC CATHETERIZATION N/A 11/02/2016   Procedure: Left Heart Cath and Cors/Grafts Angiography and possible PCI;  Surgeon: Antonette Batters, MD;  Location: [facility] INVASIVE CV LAB;  Service: Cardiovascular;  Laterality: N/A;   CARDIAC CATHETERIZATION N/A 11/02/2016   Procedure: Coronary Stent Intervention;  Surgeon: Antonette Batters, MD;  Location: [facility] INVASIVE CV LAB;  Service: Cardiovascular;  Laterality: N/A;   CARDIOVERSION N/A 06/29/2022   Procedure: CARDIOVERSION;  Surgeon: Michelle Aid, MD;  Location: [facility] ORS;  Service: Cardiovascular;  Laterality: N/A;   CHOLECYSTECTOMY     CIRCUMCISION N/A 06/09/2019   Procedure: CIRCUMCISION ADULT;  Surgeon: Lawerence Pressman, MD;  Location: [facility] ORS;  Service: Urology;  Laterality: N/A;   COLONOSCOPY WITH PROPOFOL  N/A 04/01/2018   Procedure: COLONOSCOPY WITH PROPOFOL ;  Surgeon: Cassie Click, MD;  Location: [facility] ENDOSCOPY;  Service: Endoscopy;  Laterality: N/A;   COLONOSCOPY WITH PROPOFOL  N/A 05/01/2023   Procedure: COLONOSCOPY WITH PROPOFOL ;  Surgeon: Selena Daily, MD;  Location: [facility] ENDOSCOPY;  Service: Gastroenterology;  Laterality: N/A;   ESOPHAGEAL DILATION     ESOPHAGOGASTRODUODENOSCOPY (EGD) WITH PROPOFOL  N/A 04/01/2018   Procedure: ESOPHAGOGASTRODUODENOSCOPY (EGD) WITH PROPOFOL ;  Surgeon: Cassie Click, MD;  Location: [facility] ENDOSCOPY;  Service: Endoscopy;  Laterality: N/A;   ESOPHAGOGASTRODUODENOSCOPY (EGD) WITH PROPOFOL  N/A 05/01/2023   Procedure:  ESOPHAGOGASTRODUODENOSCOPY (EGD) WITH PROPOFOL ;  Surgeon: Selena Daily, MD;  Location: [facility] ENDOSCOPY;  Service: Gastroenterology;  Laterality: N/A;   GIVENS CAPSULE STUDY  05/01/2023   Procedure: GIVENS CAPSULE STUDY;  Surgeon: Selena Daily, MD;  Location: [facility] ENDOSCOPY;  Service: Gastroenterology;;   LEFT HEART CATH AND CORS/GRAFTS ANGIOGRAPHY N/A 06/12/2019   Procedure: LEFT  HEART CATH AND CORS/GRAFTS ANGIOGRAPHY;  Surgeon: Ronney Cola, MD;  Location: [facility] INVASIVE CV LAB;  Service: Cardiovascular;  Laterality: N/A;   LEFT HEART CATH AND CORS/GRAFTS ANGIOGRAPHY N/A 03/11/2020   Procedure: LEFT HEART CATH AND CORS/GRAFTS ANGIOGRAPHY;  Surgeon: Percival Brace, MD;  Location: [facility] INVASIVE CV LAB;  Service: Cardiovascular;  Laterality: N/A;   LEFT HEART CATH AND CORS/GRAFTS ANGIOGRAPHY N/A 05/01/2021   Procedure: LEFT HEART CATH AND CORS/GRAFTS ANGIOGRAPHY;  Surgeon: Michelle Aid, MD;  Location: [facility] INVASIVE CV LAB;  Service: Cardiovascular;  Laterality: N/A;   LEFT HEART CATH AND CORS/GRAFTS ANGIOGRAPHY N/A 11/02/2022   Procedure: LEFT HEART CATH AND CORS/GRAFTS ANGIOGRAPHY;  Surgeon: Antonette Batters, MD;  Location: [facility] INVASIVE CV LAB;  Service: Cardiovascular;  Laterality: N/A;   TONSILLECTOMY     VASCULAR SURGERY     Family History  Problem Relation Age of Onset   Heart attack Mother    Depression Mother    Heart disease Mother    COPD Mother    Hypertension Mother    Heart attack Father    Diabetes Father    Depression Father    Heart disease Father    Cirrhosis Father    Parkinson's disease Brother    Social History   Socioeconomic History   Marital status: Widowed    Spouse name: Not on file   Number of children: 1   Years of education: Not on file   Highest education level: 10th grade  Occupational History   Occupation: Disabled   Occupation: on Tree surgeon  Tobacco Use   Smoking status: Former    Current packs/day: 0.00    Average  packs/day: 3.0 packs/day for 50.0 years (150.0 ttl pk-yrs)    Types: Cigarettes    Start date: 04/23/1963    Quit date: 04/22/2013    Years since quitting: 10.9   Smokeless tobacco: Never   Tobacco comments:    Reports not smoking for approx 8 years.  Vaping Use   Vaping status: Never Used  Substance and Sexual Activity   Alcohol use: No    Comment: remotely quit alcohol use. Hx of heavy alcohol use.   Drug use: Not Currently    Types: Marijuana   Sexual activity: Not Currently  Other Topics Concern   Not on file  Social History Narrative   Pt lives in [place] with wife.  Does not routinely exercise.   Social Drivers of Corporate investment banker Strain: Low Risk  (03/14/2024)   Overall Financial Resource Strain (CARDIA)    Difficulty of Paying Living Expenses: Not hard at all  Food Insecurity: No Food Insecurity (03/14/2024)   Hunger Vital Sign    Worried About Running Out of Food in the Last Year: Never true    Ran Out of Food in the Last Year: Never true  Recent Concern: Food Insecurity - Food Insecurity Present (02/28/2024)   Hunger Vital Sign    Worried About Running Out of Food in the Last Year: Sometimes true    Ran Out of Food in the Last Year: Sometimes true  Transportation Needs: No Transportation Needs (03/14/2024)   PRAPARE - Administrator, Civil Service (Medical): No    Lack of Transportation (Non-Medical): No  Physical Activity: Insufficiently Active (03/14/2024)   Exercise Vital Sign    Days of Exercise per Week: 3 days    Minutes of Exercise per Session: 20 min  Stress: No Stress Concern Present (03/14/2024)  [employer] of Occupational Health - Occupational Stress Questionnaire    Feeling of Stress : Only a little  Recent Concern: Stress - Stress Concern Present (01/13/2024)   [employer] of Occupational Health - Occupational Stress Questionnaire    Feeling of Stress : To some extent  Social Connections: Moderately Isolated  (03/14/2024)   Social Connection and Isolation Panel [NHANES]    Frequency of Communication with Friends and Family: More than three times a week    Frequency of Social Gatherings with Friends and Family: More than three times a week    Attends Religious Services: 1 to 4 times per year    Active Member of [employer] or Organizations: No    Attends Banker Meetings: Never    Marital Status: Widowed    Tobacco Counseling Counseling given: Not Answered Tobacco comments: Reports not smoking for approx 8 years.    Clinical Intake:  Pre-visit preparation completed: Yes  Pain : No/denies pain Pain Score: 0-No pain     BMI - recorded: 28.7 Nutritional Status: BMI 25 -29 Overweight Nutritional Risks: None Diabetes: Yes CBG done?: No Did pt. bring in CBG monitor from home?: No  Lab Results  Component Value Date   HGBA1C 5.8 02/01/2024   HGBA1C 6.2 (H) 10/18/2023   HGBA1C 6.1 (H) 08/05/2023     How often do you need to have someone help you when you read instructions, pamphlets, or other written materials from your doctor or pharmacy?: 3 - Sometimes  Interpreter Needed?: No  Information entered by :: Dellie Fergusson, LPN   Activities of Daily Living    03/14/2024    4:11 PM 02/09/2024    8:36 AM  In your present state of health, do you have any difficulty performing the following activities:  Hearing? 0 0  Vision? 0 0  Difficulty concentrating or making decisions? 0 0  Walking or climbing stairs? 0   Dressing or bathing? 0   Doing errands, shopping? 0 0  Preparing Food and eating ? N   Using the Toilet? N   In the past six months, have you accidently leaked urine? N   Do you have problems with loss of bowel control? N   Managing your Medications? N   Managing your Finances? N   Housekeeping or managing your Housekeeping? Y     Patient Care Team: Lamon Pillow, MD as PCP - General (Family Medicine) Cordie Deters, MD as Consulting Physician (Pulmonary  Disease) Cristino Donna as Physician Assistant Julia Oats, OD as Consulting Physician (Optometry) Adrian Alba Deadra Everts, MD as Consulting Physician (Oncology) Roxie Cord, RN as [facility] Care Management Marcheta Seta, MD as Referring Physician (Cardiology) [address] [place], [place] as [facility] Care Management Brown, Fallon E, NP as Nurse Practitioner (Vascular Surgery) Roxie Cord, RN  Indicate any recent Medical Services you may have received from other than [facility] providers in the past year (date may be approximate).     Assessment:   This is a routine wellness examination for Lawrence [name].  Hearing/Vision screen Hearing Screening - Comments:: NO AIDS Vision Screening - Comments:: WEARS GLASSES ALL DAY- DR.Rizzolo   Goals Addressed             This Visit's Progress    DIET - REDUCE SUGAR INTAKE         Depression Screen     03/14/2024    4:07 PM 03/10/2024    2:00 PM 02/28/2024    5:22 PM 01/13/2024  4:06 PM 12/31/2023    8:56 AM 12/10/2023    1:11 PM 10/22/2023    2:58 PM  PHQ 2/9 Scores  PHQ - 2 Score 2 5 0 0 0  0  PHQ- 9 Score 3 20       Exception Documentation      Patient refusal     Fall Risk     03/14/2024    4:11 PM 09/03/2023    2:00 PM 03/10/2023    3:13 PM 09/10/2022    2:00 PM 01/08/2022    3:51 PM  Fall Risk   Falls in the past year? 1 1 1 1 1   Number falls in past yr: 1 1 1 1 1   Comment   8 falls    Injury with Fall? 0 1 1 0 0  Comment   ER called to house:a couple of weeks ago;took off blood thinners until May 1,2024    Risk for fall due to : History of fall(s);Impaired balance/gait History of fall(s) No Fall Risks History of fall(s) History of fall(s)  Follow up Falls prevention discussed;Falls evaluation completed Falls evaluation completed Education provided;Falls prevention discussed Falls evaluation completed Falls prevention discussed    MEDICARE RISK AT HOME:  Medicare Risk at Home Any stairs in or around the home?: No If so, are  there any without handrails?: No Home free of loose throw rugs in walkways, pet beds, electrical cords, etc?: Yes Adequate lighting in your home to reduce risk of falls?: Yes Life alert?: No Use of a cane, walker or w/c?: Yes (ROLLATOR ALL THE TIME) Grab bars in the bathroom?: Yes Shower chair or bench in shower?: Yes Elevated toilet seat or a handicapped toilet?: No  TIMED UP AND GO:  Was the test performed?  No  Cognitive Function: 6CIT completed        03/14/2024    4:14 PM 03/10/2023    3:33 PM 10/27/2016    1:22 PM  6CIT Screen  What Year? 0 points 0 points 0 points  What month? 0 points 0 points 0 points  What time? 3 points 0 points 0 points  Count back from 20 0 points 0 points 0 points  Months in reverse 4 points 4 points 4 points  Repeat phrase 0 points 0 points 4 points  Total Score 7 points 4 points 8 points    Immunizations Immunization History  Administered Date(s) Administered   Fluad Quad(high Dose 65+) 09/24/2020, 09/10/2022   Fluad Trivalent(High Dose 65+) 07/21/2023   Influenza Inj Mdck Quad Pf 08/25/2019   Influenza, Seasonal, Injecte, Preservative Fre 08/03/2011   Influenza,inj,Quad PF,6+ Mos 08/02/2015, 07/17/2016, 08/13/2017, 07/27/2018   Influenza-Unspecified 07/27/2018   PFIZER(Purple Top)SARS-COV-2 Vaccination 03/13/2020, 04/03/2020, 10/05/2020   PNEUMOCOCCAL CONJUGATE-20 09/10/2022   Pneumococcal Conjugate-13 05/13/2020   Pneumococcal Polysaccharide-23 05/06/2004, 08/22/2015   Td 10/28/2009, 08/31/2017   Tdap 05/13/2011   Zoster Recombinant(Shingrix) 08/13/2017    Screening Tests Health Maintenance  Topic Date Due   Zoster Vaccines- Shingrix (2 of 2) 10/08/2017   OPHTHALMOLOGY EXAM  02/24/2019   COVID-19 Vaccine (4 - 2024-25 season) 07/18/2023   INFLUENZA VACCINE  06/16/2024   Diabetic kidney evaluation - Urine ACR  07/20/2024   Lung Cancer Screening  07/24/2024   HEMOGLOBIN A1C  08/03/2024   Diabetic kidney evaluation - eGFR  measurement  03/11/2025   Medicare Annual Wellness (AWV)  03/14/2025   DTaP/Tdap/Td (4 - Td or Tdap) 09/01/2027   Colonoscopy  04/30/2028   Pneumonia Vaccine 65+ Years  old  Completed   Hepatitis C Screening  Completed   HPV VACCINES  Aged Out   Meningococcal B Vaccine  Aged Out    Health Maintenance  Health Maintenance Due  Topic Date Due   Zoster Vaccines- Shingrix (2 of 2) 10/08/2017   OPHTHALMOLOGY EXAM  02/24/2019   COVID-19 Vaccine (4 - 2024-25 season) 07/18/2023   Health Maintenance Items Addressed: NEEDS EYE EXAM; SHOTS UP TO DATE EXCEPT COVID; COLONOSCOPY UP TO DATE  Additional Screening:  Vision Screening: Recommended annual ophthalmology exams for early detection of glaucoma and other disorders of the eye.  Dental Screening: Recommended annual dental exams for proper oral hygiene  Community Resource Referral / Chronic Care Management: CRR required this visit?  No   CCM required this visit?  No     Plan:     I have personally reviewed and noted the following in the patient's chart:   Medical and social history Use of alcohol, tobacco or illicit drugs  Current medications and supplements including opioid prescriptions. Patient is currently taking opioid prescriptions. Information provided to patient regarding non-opioid alternatives. Patient advised to discuss non-opioid treatment plan with their provider. Functional ability and status Nutritional status Physical activity Advanced directives List of other physicians Hospitalizations, surgeries, and ER visits in previous 12 months Vitals Screenings to include cognitive, depression, and falls Referrals and appointments  In addition, I have reviewed and discussed with patient certain preventive protocols, quality metrics, and best practice recommendations. A written personalized care plan for preventive services as well as general preventive health recommendations were provided to patient.     Pinky Bright, LPN   1/61/0960   After Visit Summary: (MyChart) Due to this being a telephonic visit, the after visit summary with patients personalized plan was offered to patient via MyChart   Notes:  DM LABS ORDERED

## 2024-03-15 NOTE — Telephone Encounter (Signed)
 Copied from CRM [id number]. Topic: Clinical - Medical Advice >> Mar 15, 2024  1:47 PM Lawrence [name] wrote: Patient would like a call back about his request for a medical bed he has not heard back from anyone and states he called multiple times >> Mar 15, 2024  1:54 PM Elgin Grit wrote: Tried to transfer the call to clinical and nobody was available at the time he hung up before anyone come to the phone

## 2024-03-17 ENCOUNTER — Ambulatory Visit: Payer: Self-pay

## 2024-03-17 ENCOUNTER — Other Ambulatory Visit: Payer: Self-pay

## 2024-03-17 ENCOUNTER — Emergency Department

## 2024-03-17 ENCOUNTER — Emergency Department
Admission: EM | Admit: 2024-03-17 | Discharge: 2024-03-17 | Disposition: A | Discharge status: Home / Self Care | Attending: Emergency Medicine | Admitting: Emergency Medicine

## 2024-03-17 DIAGNOSIS — I509 Heart failure, unspecified: Secondary | ICD-10-CM | POA: Insufficient documentation

## 2024-03-17 DIAGNOSIS — Z951 Presence of aortocoronary bypass graft: Secondary | ICD-10-CM | POA: Diagnosis not present

## 2024-03-17 DIAGNOSIS — R079 Chest pain, unspecified: Secondary | ICD-10-CM | POA: Diagnosis present

## 2024-03-17 DIAGNOSIS — Z7901 Long term (current) use of anticoagulants: Secondary | ICD-10-CM | POA: Diagnosis not present

## 2024-03-17 DIAGNOSIS — R0602 Shortness of breath: Secondary | ICD-10-CM | POA: Insufficient documentation

## 2024-03-17 DIAGNOSIS — I2510 Atherosclerotic heart disease of native coronary artery without angina pectoris: Secondary | ICD-10-CM | POA: Insufficient documentation

## 2024-03-17 LAB — COMPREHENSIVE METABOLIC PANEL WITH GFR
ALT: 18 U/L (ref 0–44)
AST: 20 U/L (ref 15–41)
Albumin: 4.1 g/dL (ref 3.5–5.0)
Alkaline Phosphatase: 73 U/L (ref 38–126)
Anion gap: 9 (ref 5–15)
BUN: 16 mg/dL (ref 8–23)
CO2: 23 mmol/L (ref 22–32)
Calcium: 9.2 mg/dL (ref 8.9–10.3)
Chloride: 105 mmol/L (ref 98–111)
Creatinine, Ser: 0.82 mg/dL (ref 0.61–1.24)
GFR, Estimated: >60 mL/min (ref >60)
Glucose, Bld: 109 mg/dL — ABNORMAL HIGH (ref 70–99)
Potassium: 3.8 mmol/L (ref 3.5–5.1)
Sodium: 137 mmol/L (ref 135–145)
Total Bilirubin: 0.8 mg/dL (ref 0.0–1.2)
Total Protein: 6.8 g/dL (ref 6.5–8.1)

## 2024-03-17 LAB — CBC WITH DIFFERENTIAL/PLATELET
Abs Immature Granulocytes: 0.01 10*3/uL (ref 0.00–0.07)
Basophils Absolute: 0 10*3/uL (ref 0.0–0.1)
Basophils Relative: 1 %
Eosinophils Absolute: 0.3 10*3/uL (ref 0.0–0.5)
Eosinophils Relative: 4 %
HCT: 49.9 % (ref 39.0–52.0)
Hemoglobin: 15.6 g/dL (ref 13.0–17.0)
Immature Granulocytes: 0 %
Lymphocytes Relative: 30 %
Lymphs Abs: 1.9 10*3/uL (ref 0.7–4.0)
MCH: 27.4 pg (ref 26.0–34.0)
MCHC: 31.3 g/dL (ref 30.0–36.0)
MCV: 87.7 fL (ref 80.0–100.0)
Monocytes Absolute: 0.6 10*3/uL (ref 0.1–1.0)
Monocytes Relative: 10 %
Neutro Abs: 3.5 10*3/uL (ref 1.7–7.7)
Neutrophils Relative %: 55 %
Platelets: 168 10*3/uL (ref 150–400)
RBC: 5.69 MIL/uL (ref 4.22–5.81)
RDW: 14.7 % (ref 11.5–15.5)
WBC: 6.3 10*3/uL (ref 4.0–10.5)
nRBC: 0 % (ref 0.0–0.2)

## 2024-03-17 LAB — TROPONIN I (HIGH SENSITIVITY)
Troponin I (High Sensitivity): 15 ng/L (ref <18)
Troponin I (High Sensitivity): 16 ng/L (ref <18)

## 2024-03-17 LAB — PROTIME-INR
INR: 1.2 (ref 0.8–1.2)
Prothrombin Time: 15.6 s — ABNORMAL HIGH (ref 11.4–15.2)

## 2024-03-17 LAB — BRAIN NATRIURETIC PEPTIDE: B Natriuretic Peptide: 241.4 pg/mL — ABNORMAL HIGH (ref 0.0–100.0)

## 2024-03-17 LAB — SAMPLE TO BLOOD BANK

## 2024-03-17 MED ORDER — NITROGLYCERIN 0.4 MG SL SUBL: 0.4000 mg | SUBLINGUAL_TABLET | SUBLINGUAL | Status: DC | PRN

## 2024-03-17 MED ORDER — ALUM & MAG HYDROXIDE-SIMETH 200-200-20 MG/5ML PO SUSP
30.0000 mL | Freq: Once | ORAL | Status: AC
  Administered 2024-03-17: 30 mL via ORAL
  Filled 2024-03-17: qty 30

## 2024-03-17 MED ORDER — NITROGLYCERIN 0.4 MG SL SUBL
0.4000 mg | SUBLINGUAL_TABLET | Freq: Once | SUBLINGUAL | Status: AC
  Administered 2024-03-17: 0.4 mg via SUBLINGUAL
  Filled 2024-03-17: qty 1

## 2024-03-17 MED ORDER — SUCRALFATE 1 G PO TABS
1.0000 g | ORAL_TABLET | Freq: Once | ORAL | Status: AC
  Administered 2024-03-17: 1 g via ORAL
  Filled 2024-03-17: qty 1

## 2024-03-17 MED ORDER — NITROGLYCERIN 0.4 MG SL SUBL: 0.4000 mg | SUBLINGUAL_TABLET | Freq: Once | SUBLINGUAL | Status: DC

## 2024-03-17 MED ORDER — HYDROMORPHONE HCL 1 MG/ML IJ SOLN
0.5000 mg | Freq: Once | INTRAMUSCULAR | Status: AC
  Administered 2024-03-17: 0.5 mg via INTRAVENOUS
  Filled 2024-03-17: qty 0.5

## 2024-03-17 MED ORDER — LIDOCAINE VISCOUS HCL 2 % MT SOLN
15.0000 mL | Freq: Once | OROMUCOSAL | Status: AC
  Administered 2024-03-17: 15 mL via ORAL
  Filled 2024-03-17: qty 15

## 2024-03-17 MED ORDER — IOHEXOL 350 MG/ML SOLN
100.0000 mL | Freq: Once | INTRAVENOUS | Status: AC | PRN
Start: 2024-03-17 — End: 2024-03-17
  Administered 2024-03-17: 100 mL via INTRAVENOUS

## 2024-03-17 MED ORDER — ONDANSETRON HCL 4 MG/2ML IJ SOLN
4.0000 mg | Freq: Once | INTRAMUSCULAR | Status: AC
  Administered 2024-03-17: 4 mg via INTRAVENOUS
  Filled 2024-03-17: qty 2

## 2024-03-17 MED ORDER — MORPHINE SULFATE (PF) 4 MG/ML IV SOLN
4.0000 mg | Freq: Once | INTRAVENOUS | Status: AC
  Administered 2024-03-17: 4 mg via INTRAVENOUS
  Filled 2024-03-17: qty 1

## 2024-03-17 NOTE — Telephone Encounter (Signed)
 Please see the message below.

## 2024-03-17 NOTE — ED Triage Notes (Signed)
 [facility] reports pt coming from home c/o chest pain that started this morning around 0800. Pt states pain is sharp and goes into his back, rates it 9/10.

## 2024-03-17 NOTE — ED Notes (Signed)
 Pt aox4 cleared and discharged. All discharge teachings given to pt. Pt verbalizes back understanding of teachings. Departed with family

## 2024-03-17 NOTE — ED Provider Notes (Signed)
 [name] Provider Note    Event Date/Time   First MD Initiated Contact with Patient 03/17/24 1805     (approximate)   History   Chest Pain   HPI  Lawrence [name] is a 68 y.o. male history of atrial fibrillation on Eliquis , CHF, CAD status post CABG, presenting with chest pain.  Patient states that he has been having chest pain intermittently for several days, this episode started around 8:00, got worse over time, states that he radiates to his back and down the back.  He denies any numbness or tingling.  States that his blood pressures have been high, going into the systolics of 190s, states that his physician has been trying to titrate his medications, states that he has been taking his medications as prescribed.  Has mild shortness of breath, no cough or fever.  No leg swelling.  Independent history obtained from EMS, they gave him 324 of aspirin , did not give him any nitroglycerin .   On independent chart review, for his med list he supposed to be on carvedilol , spironolactone , Imdur .  On independent review, he was seen by his cardiologist in March 2025, has history of recurrent chest pain that he describes as similar to previous MI that occurs couple times a day often with sitting, has had multiple ED visits for similar pain without elevation of his troponins, has been started on Carafate  4 times a day for GI issues including chronic reflux and esophageal irritation that was seen in prior CT scans, his last cath was in 2020 for that showed moderate diffuse disease in his LAD with patent stent, 50% ostial left circumflex with competitive flow in OM1 and RCA with mild diffuse disease, his LM is normal.  His cardiologist thinks that the chest pain is not cardiac in nature but related to musculoskeletal versus GI related pain.  Physical Exam   Triage Vital Signs: ED Triage Vitals  Encounter Vitals Group     BP      Systolic BP Percentile      Diastolic BP  Percentile      Pulse      Resp      Temp      Temp src      SpO2      Weight      Height      Head Circumference      Peak Flow      Pain Score      Pain Loc      Pain Education      Exclude from Growth Chart     Most recent vital signs: Vitals:   03/17/24 1814 03/17/24 2124  BP:  (!) 167/98  Pulse:  98  Resp:  12  Temp: 97.6 F (36.4 C) 97.8 F (36.6 C)  SpO2:  98%     General: Awake, no distress.  CV:  Good peripheral perfusion.  Anterior thoracic cage is mildly tender to palpation Resp:  Normal effort.  Clear Abd:  No distention.  Soft nontender Other:  No lower extremity edema, no unilateral calf swelling or tenderness, he has equal radial and DP pulses bilaterally.  No focal weakness or numbness.   ED Results / Procedures / Treatments   Labs (all labs ordered are listed, but only abnormal results are displayed) Labs Reviewed  COMPREHENSIVE METABOLIC PANEL WITH GFR - Abnormal; Notable for the following components:      Result Value   Glucose, Bld 109 (*)  All other components within normal limits  BRAIN NATRIURETIC PEPTIDE - Abnormal; Notable for the following components:   B Natriuretic Peptide 241.4 (*)    All other components within normal limits  PROTIME-INR - Abnormal; Notable for the following components:   Prothrombin Time 15.6 (*)    All other components within normal limits  CBC WITH DIFFERENTIAL/PLATELET  SAMPLE TO BLOOD BANK  TROPONIN I (HIGH SENSITIVITY)  TROPONIN I (HIGH SENSITIVITY)     EKG  EKG showed atrial fibrillation, rate 90, normal QRS, normal QTc, T wave flattening in aVL, no ischemic ST elevation, no significant change compared to prior   RADIOLOGY CT angio on my independent interpretation without obvious thoracic dissection.   PROCEDURES:  Critical Care performed: Yes, see critical care procedure note(s)  .Critical Care  Performed by: Shane Darling, MD Authorized by: Shane Darling, MD   Critical care provider  statement:    Critical care time (minutes):  40   Critical care was necessary to treat or prevent imminent or life-threatening deterioration of the following conditions:  Cardiac failure   Critical care was time spent personally by me on the following activities:  Development of treatment plan with patient or surrogate, discussions with consultants, evaluation of patient's response to treatment, examination of patient, ordering and review of laboratory studies, ordering and review of radiographic studies, ordering and performing treatments and interventions, pulse oximetry, re-evaluation of patient's condition and review of old charts    MEDICATIONS ORDERED IN ED: Medications  nitroGLYCERIN  (NITROSTAT ) SL tablet 0.4 mg (0.4 mg Sublingual Given 03/17/24 1847)  iohexol  (OMNIPAQUE ) 350 MG/ML injection 100 mL (100 mLs Intravenous Contrast Given 03/17/24 1836)  HYDROmorphone  (DILAUDID ) injection 0.5 mg (0.5 mg Intravenous Given 03/17/24 1915)  ondansetron  (ZOFRAN ) injection 4 mg (4 mg Intravenous Given 03/17/24 1915)  alum & mag hydroxide-simeth (MAALOX/MYLANTA) 200-200-20 MG/5ML suspension 30 mL (30 mLs Oral Given 03/17/24 1931)    And  lidocaine  (XYLOCAINE ) 2 % viscous mouth solution 15 mL (15 mLs Oral Given 03/17/24 1931)  sucralfate  (CARAFATE ) tablet 1 g (1 g Oral Given 03/17/24 1931)  morphine  (PF) 4 MG/ML injection 4 mg (4 mg Intravenous Given 03/17/24 2107)     IMPRESSION / MDM / ASSESSMENT AND PLAN / ED COURSE  I reviewed the triage vital signs and the nursing notes.                              Differential diagnosis includes, but is not limited to, ACS, angina, dissection, costochondritis, GERD, esophagitis, musculoskeletal pain, considered PE but he is not hypoxic, not tachycardic, has been taking his Eliquis , no unilateral calf swelling or tenderness, this is lower on the differential.  Will get labs, EKG, troponin, CT dissection protocol.  Waive labs given that his initial blood pressures here were  in the 190s and he is complaining about pain radiating to his back.  Patient's presentation is most consistent with acute presentation with potential threat to life or bodily function.  Independent review of labs imaging below.  When asked to reassess patient, he states that he still is having chest pain, states that the only medications that help his IV morphine .  I asked about the morphine  allergy and he states that he had nausea vomiting with the oral morphine  but tolerates IV morphine .  States that he has not followed up with GI to discuss EGD or titration of his medications.  Discussed with patient about his cardiologist  concerned that this might be related to musculoskeletal pain versus GI issues.  Patient does state that he is taking his PPI as well as Carafate .  Discussed with patient about imaging as well as lab results, will get a repeat troponin on him.  Repeat troponin is negative, patient is asking to leave.  Considered but no indication for inpatient admission at this time, he is safe for outpatient management.  Will discharge with strict return precautions and instructed to follow-up with his primary care doctor, cardiology as well as GI doctor.  The patient is on the cardiac monitor to evaluate for evidence of arrhythmia and/or significant heart rate changes.   Clinical Course as of 03/17/24 2212  Fri Mar 17, 2024  1909 CT Angio Chest/Abd/Pel for Dissection W and/or W/WO IMPRESSION: 1. Aortic atherosclerosis. No evidence of aneurysm or dissection involving the thoracic or abdominal aorta. 2. No evidence of significant pulmonary embolus. 3. Lungs are clear. 4. No acute process demonstrated in the abdomen or pelvis. No evidence of bowel obstruction or inflammation.   [TT]  1909 Independent review of labs, troponin is not elevated, BNP is mildly elevated but patient does not appear volume overloaded this time, no leukocytosis. [TT]  1911 On reassessment patient states that the  nitroglycerin  did not help with the pain, will give him some Dilaudid  and reassess. [TT]    Clinical Course User Index [TT] Drenda Gentle, Richard Champion, MD     FINAL CLINICAL IMPRESSION(S) / ED DIAGNOSES   Final diagnoses:  Chest pain, unspecified type     Rx / DC Orders   ED Discharge Orders     None        Note:  This document was prepared using Dragon voice recognition software and may include unintentional dictation errors.    Shane Darling, MD 03/17/24 [phone]

## 2024-03-17 NOTE — Discharge Instructions (Signed)
 Please make sure to follow-up with your primary care doctor, cardiologist as well as a gastroenterologist for further management of your symptoms.

## 2024-03-17 NOTE — Telephone Encounter (Unsigned)
 Copied from CRM [id number]. Topic: Clinical - Medication Question >> Mar 17, 2024  9:32 AM Baldomero Bone wrote: Reason for CRM: Patient would like to have a hospital bed twin size ordered for home delivery. Callback number is [phone]

## 2024-03-17 NOTE — Telephone Encounter (Signed)
 Copied from CRM [id number]. Topic: Clinical - Red Word Triage >> Mar 17, 2024  9:27 AM Baldomero Bone wrote: Red Word that prompted transfer to Nurse Triage: Sophia with [facility] states that patient's blood pressure is 155/110 this morning; patient has not taken medication yet today, and even with medication, blood pressure does not come down. Patient was not transferred because he was on with Sophia with [facility]. Advised her to tell patient about the callback number a nurse. Callback number is [phone]  Chief Complaint: Elevated BP Symptoms: Headache, chest pain, difficulty breathing, weakness Frequency: A few days Pertinent Negatives: Patient denies vision changes Disposition: [x] ED /[] Urgent Care (no appt availability in office) / [] Appointment(In office/virtual)/ []  [facility]/ [] Home Care/ [] Refused Recommended Disposition /[] [facility]/ []  Follow-up with PCP Additional Notes: Patient reported elevated BP along with other symptoms. Patient stated his most recent BP was 172/124, taken 20 minutes prior to this call. Patient stated he has already taken his BP medications today. Patient stated he is experiencing a "slight" headache, "a little" chest pain last night, "slight" difficulty breathing and noticeably increased weakness from baseline. Advised ED at this time, per protocol. Patient verbalized understanding and stated he would have someone drive him. Provided care advice and instructed patient to call back if symptoms worsen. Patient complied.   Reason for Disposition  [1] Systolic BP  >= 160 OR Diastolic >= 100 AND [2] cardiac (e.g., breathing difficulty, chest pain) or neurologic symptoms (e.g., new-onset blurred or double vision, unsteady gait)  Answer Assessment - Initial Assessment Questions 1. BLOOD PRESSURE: "What is the blood pressure?" "Did you take at least two measurements 5 minutes apart?"     142/110 at 9:45am, 172/124 at 11am 2. ONSET: "When did you take your  blood pressure?"     Today 3. HOW: "How did you take your blood pressure?" (e.g., automatic home BP monitor, visiting nurse)     Automatic BP cuff at home 4. HISTORY: "Do you have a history of high blood pressure?"     Yes 5. MEDICINES: "Are you taking any medicines for blood pressure?" "Have you missed any doses recently?"     States he is taking many BP medications, "they don't seem to be working", states he addressed this at OV on 03/10/24, states he took his BP medications at 8:30am this morning 6. OTHER SYMPTOMS: "Do you have any symptoms?" (e.g., blurred vision, chest pain, difficulty breathing, headache, weakness)     "Slight" headache, "a little" chest pain at night- thinks this is due to Afib, "a little difficulty breathing", denies vision changes, "more weakness" than usual  Protocols used: Blood Pressure - High-A-AH

## 2024-03-20 ENCOUNTER — Telehealth: Payer: Self-pay

## 2024-03-20 NOTE — Transitions of Care (Post Inpatient/ED Visit) (Signed)
 03/20/2024  Name: Lawrence [name] MRN: [id number] DOB: 1956-02-03  Today's TOC FU Call Status: Today's TOC FU Call Status:: Successful TOC FU Call Completed TOC FU Call Complete Date: 03/20/24 Patient's Name and Date of Birth confirmed.  Transition Care Management Follow-up Telephone Call Date of Discharge: 03/17/24 Discharge Facility: [facility]) Type of Discharge: Emergency Department Reason for ED Visit: Other: (chest pain) How have you been since you were released from the hospital?: Better Any questions or concerns?: No  Items Reviewed: Did you receive and understand the discharge instructions provided?: Yes Medications obtained,verified, and reconciled?: Yes (Medications Reviewed) Any new allergies since your discharge?: No Dietary orders reviewed?: Yes Do you have support at home?: Yes People in Home [RPT]: grandchild(ren)  Medications Reviewed Today: Medications Reviewed Today     Reviewed by Darrall Ellison, LPN (Licensed Practical Nurse) on 03/20/24 at 1545  Med List Status: <None>   Medication Order Taking? Sig Documenting Provider Last Dose Status Informant  Accu-Chek Softclix Lancets lancets [id number] No Use as instructed to check sugar daily for type 2 diabetes. Lamon Pillow, MD Taking Active Pharmacy Records, Other  allopurinol  (ZYLOPRIM ) 300 MG tablet [id number] No Take 300 mg by mouth 2 (two) times daily. [provider] Taking Active   ALPRAZolam  (XANAX ) 1 MG tablet [id number] No Take 1 tablet (1 mg total) by mouth 3 (three) times daily as needed. Lamon Pillow, MD Taking Active Pharmacy Records, Other  aluminum -magnesium  hydroxide-simethicone  (MAALOX) 200-200-20 MG/5ML SUSP [id number] No Take 30 mLs by mouth 4 (four) times daily -  before meals and at bedtime. Jacquie Maudlin, MD Taking Active   amLODipine  (NORVASC ) 10 MG tablet [id number] No Take 10 mg by mouth daily. [provider] Taking Active Self   apixaban  (ELIQUIS ) 5 MG TABS tablet [id number] No Take 1 tablet (5 mg total) by mouth 2 (two) times daily. Lamon Pillow, MD Taking Active Pharmacy Records, Other  atorvastatin  (LIPITOR ) 80 MG tablet [id number] No TAKE 1 TABLET BY MOUTH AT BEDTIME Lamon Pillow, MD Taking Active Pharmacy Records, Other  Blood Glucose Calibration (ACCU-CHEK GUIDE CONTROL) LIQD [id number] No Use with blood glucose monitor as directed Lamon Pillow, MD Taking Active   Blood Glucose Monitoring Suppl (ACCU-CHEK GUIDE) w/Device KIT [id number] No Use to check blood sugars as directed Lamon Pillow, MD Taking Active   Budeson-Glycopyrrol-Formoterol  (BREZTRI  AEROSPHERE) 160-9-4.8 MCG/ACT AERO [id number] No Inhale 2 puffs into the lungs 2 (two) times daily. Lamon Pillow, MD Taking Active Pharmacy Records, Other  carvedilol  (COREG ) 3.125 MG tablet [id number] No Take 3.125 mg by mouth 2 (two) times daily with a meal. [provider] Taking Active   carvedilol  (COREG ) 3.125 MG tablet [id number] No TAKE 1 TABLET BY MOUTH TWICE A DAY Lamon Pillow, MD Taking Active   cetirizine  (ZYRTEC ) 10 MG tablet [id number] No TAKE 1 TABLET BY MOUTH AT BEDTIME Lamon Pillow, MD Taking Active Pharmacy Records, Other           Med Note (BEJOS, ANJA J   Mon Oct 18, 2023 10:05 PM) Last filled 4D supply in June  Cyanocobalamin  (VITAMIN B-12) 1000 MCG SUBL [id number] No Place 1 tablet under the tongue daily. [provider] Taking Active Pharmacy Records, Other  famotidine  (PEPCID ) 20 MG tablet [id number] No Take 1 tablet (20 mg total) by mouth 2 (two) times daily for 15 days. Jacquie Maudlin, MD Taking Active   FARXIGA  10 MG TABS tablet [id number] No TAKE 1 TABLET  BY MOUTH DAILY Lamon Pillow, MD Taking Active   gabapentin  (NEURONTIN ) 300 MG capsule [id number] No Take 1 capsule at bedtime for 1 week, then increase to 1 twice a day Lamon Pillow, MD Taking Active   glucose blood (ACCU-CHEK GUIDE) test strip [id number]  No Use as instructed to check sugar daily for type 2 diabetes. Lamon Pillow, MD Taking Active Pharmacy Records, Other  hydrOXYzine  (VISTARIL ) 25 MG capsule [id number] No Take 1 capsule (25 mg total) by mouth 3 (three) times daily as needed. Lubertha Rush, MD Taking Active   iron  polysaccharides (FERREX 150) 150 MG capsule [id number] No TAKE 1 CAPSULE BY MOUTH DAILY Lamon Pillow, MD Taking Active Pharmacy Records, Other  isosorbide  mononitrate (IMDUR ) 120 MG 24 hr tablet [id number] No Take 1 tablet (120 mg total) by mouth daily. Althia Atlas, MD Taking Active   lidocaine  (LIDODERM ) 5 % [id number] No Place 1 patch onto the skin daily. Remove & Discard patch within 12 hours or as directed by MD Luna Salinas, MD Taking Active Pharmacy Records, Other  metFORMIN  (GLUCOPHAGE -XR) 500 MG 24 hr tablet [id number] No TAKE 1 TABLET BY MOUTH DAILY WITH EVENING MEAL Lamon Pillow, MD Taking Active   methocarbamol  (ROBAXIN ) 500 MG tablet [id number] No TAKE 1 TABLET BY MOUTH EVERY 8 HOURS AS NEEDED FOR MUSCLE SPASMS Lamon Pillow, MD Taking Active   metoCLOPramide  (REGLAN ) 10 MG tablet [id number] No Take 1 tablet (10 mg total) by mouth 4 (four) times daily -  before meals and at bedtime for 15 days. Jacquie Maudlin, MD Taking Active   nitroGLYCERIN  (NITROSTAT ) 0.4 MG SL tablet [id number] No Place 1 tablet (0.4 mg total) under the tongue every 5 (five) minutes as needed for chest pain (Do not take if blood pressure is low, systolic BP<110). Sheril Dines, MD Taking Active   omega-3 acid ethyl esters (LOVAZA ) 1 g capsule [id number] No TAKE 4 CAPSULES (4 GRAMS TOTAL) BY Suzzanna Estimable, MD Taking Active   oxyCODONE -acetaminophen  (PERCOCET) 10-325 MG tablet [id number] No TAKE 1 TABLET BY MOUTH EVERY 4 HOURS AS NEEDED FOR PAIN Lamon Pillow, MD Taking Active   OXYGEN  [id number] No Inhale 2 L into the lungs at bedtime as needed (for shortness of breath). [provider] Taking Active Pharmacy  Records, Other  pantoprazole  (PROTONIX ) 40 MG tablet [id number] No TAKE 1 TABLET BY MOUTH 2 TIMES DAILY ( BEFORE A MEAL) Lamon Pillow, MD Taking Active   ranolazine  (RANEXA ) 1000 MG SR tablet [id number] No Take 1 tablet (1,000 mg total) by mouth 2 (two) times daily. Aisha Hove, MD Taking Active Pharmacy Records, Other  sacubitril -valsartan  (ENTRESTO ) 24-26 MG [id number] No Take 1 tablet by mouth 2 (two) times daily. [provider] Taking Active Self  sennosides-docusate sodium  (SENOKOT-S) 8.6-50 MG tablet [id number] No Take 1 tablet by mouth daily. [provider] Taking Active Self  spironolactone  (ALDACTONE ) 25 MG tablet [id number] No Take 1 tablet (25 mg total) by mouth daily. Lamon Pillow, MD Taking Active   sucralfate  (CARAFATE ) 1 GM/10ML suspension [id number] No Take 10 mLs (1 g total) by mouth 4 (four) times daily. Buell Carmin, MD Taking Active   traZODone  (DESYREL ) 100 MG tablet [id number] No Take 1 tablet (100 mg total) by mouth at bedtime as needed for sleep. Lamon Pillow, MD Taking Active             Home Care and Equipment/Supplies: Were Home Health Services Ordered?: NA Any new equipment or  medical supplies ordered?: NA  Functional Questionnaire: Do you need assistance with bathing/showering or dressing?: No Do you need assistance with meal preparation?: No Do you need assistance with eating?: No Do you have difficulty maintaining continence: No Do you need assistance with getting out of bed/getting out of a chair/moving?: No Do you have difficulty managing or taking your medications?: No  Follow up appointments reviewed: PCP Follow-up appointment confirmed?: No (declined) MD Provider Line [phone] Given: No Specialist Hospital Follow-up appointment confirmed?: NA Do you need transportation to your follow-up appointment?: No Do you understand care options if your condition(s) worsen?: Yes-patient verbalized  understanding    SIGNATURE Darrall Ellison, LPN [facility] Nurse Health Advisor Direct Dial [phone]

## 2024-03-20 NOTE — Progress Notes (Signed)
 Established patient visit   Patient: Lawrence [name]   DOB: [birth year]   68 y.o. Male  MRN: [id number] Visit Date: 03/10/2024  Today's healthcare provider: Jeralene Mom, MD   Chief Complaint  Patient presents with   Medical Management of Chronic Issues   Subjective    HPI   Discussed the use of AI scribe software for clinical note transcription with the patient, who gave verbal consent to proceed.  History of Present Illness   Lawrence [name] is a 68 year old male who presents for follow up chronic medical problems with concerns about depression and medication management.  He experiences 'bad thoughts' and is currently taking Effexor  at a dose of 25 mg once daily, reduced from 75 mg after a hospital stay three years ago. He is considering stopping Effexor  to start a new antidepressant regimen as advised by a therapist, Georgeann Kindred, who communicates with him via computer.  He reports chest pain associated with elevated blood pressure, occurring 'every time my blood pressure shoots up.' He has not experienced chest pain in the past couple of weeks. He monitors his blood pressure at home, noting it can reach 200/100 mmHg with a heart rate of 124 beats per minute during episodes of atrial fibrillation. He is currently taking carvedilol  and spironolactone , the latter at a half tablet dose, previously a full tablet.  He experiences difficulty sleeping, with trouble both falling asleep and staying asleep. He mentions sleeping on the couch after waking up early in the morning. He has not yet received a CPAP machine or a nebulizer, which he believes would help with his sleep issues.  He is concerned about potential dementia or Alzheimer's, noting episodes of forgetfulness, such as forgetting conversations with his grandson. He is interested in neuropsychiatric testing for further evaluation.        Medications: Outpatient Medications Prior to Visit  Medication Sig   Accu-Chek  Softclix Lancets lancets Use as instructed to check sugar daily for type 2 diabetes.   allopurinol  (ZYLOPRIM ) 300 MG tablet Take 300 mg by mouth 2 (two) times daily.   ALPRAZolam  (XANAX ) 1 MG tablet Take 1 tablet (1 mg total) by mouth 3 (three) times daily as needed.   amLODipine  (NORVASC ) 10 MG tablet Take 10 mg by mouth daily.   apixaban  (ELIQUIS ) 5 MG TABS tablet Take 1 tablet (5 mg total) by mouth 2 (two) times daily.   atorvastatin  (LIPITOR ) 80 MG tablet TAKE 1 TABLET BY MOUTH AT BEDTIME   Blood Glucose Calibration (ACCU-CHEK GUIDE CONTROL) LIQD Use with blood glucose monitor as directed   Blood Glucose Monitoring Suppl (ACCU-CHEK GUIDE) w/Device KIT Use to check blood sugars as directed   Budeson-Glycopyrrol-Formoterol  (BREZTRI  AEROSPHERE) 160-9-4.8 MCG/ACT AERO Inhale 2 puffs into the lungs 2 (two) times daily.   carvedilol  (COREG ) 3.125 MG tablet Take 3.125 mg by mouth 2 (two) times daily with a meal.   carvedilol  (COREG ) 3.125 MG tablet TAKE 1 TABLET BY MOUTH TWICE A DAY   cetirizine  (ZYRTEC ) 10 MG tablet TAKE 1 TABLET BY MOUTH AT BEDTIME   Cyanocobalamin  (VITAMIN B-12) 1000 MCG SUBL Place 1 tablet under the tongue daily.   FARXIGA  10 MG TABS tablet TAKE 1 TABLET BY MOUTH DAILY   gabapentin  (NEURONTIN ) 300 MG capsule Take 1 capsule at bedtime for 1 week, then increase to 1 twice a day   glucose blood (ACCU-CHEK GUIDE) test strip Use as instructed to check sugar daily for type 2 diabetes.  hydrOXYzine  (VISTARIL ) 25 MG capsule Take 1 capsule (25 mg total) by mouth 3 (three) times daily as needed.   iron  polysaccharides (FERREX 150) 150 MG capsule TAKE 1 CAPSULE BY MOUTH DAILY   isosorbide  mononitrate (IMDUR ) 120 MG 24 hr tablet Take 1 tablet (120 mg total) by mouth daily.   lidocaine  (LIDODERM ) 5 % Place 1 patch onto the skin daily. Remove & Discard patch within 12 hours or as directed by MD   metFORMIN  (GLUCOPHAGE -XR) 500 MG 24 hr tablet TAKE 1 TABLET BY MOUTH DAILY WITH EVENING MEAL    methocarbamol  (ROBAXIN ) 500 MG tablet TAKE 1 TABLET BY MOUTH EVERY 8 HOURS AS NEEDED FOR MUSCLE SPASMS   nitroGLYCERIN  (NITROSTAT ) 0.4 MG SL tablet Place 1 tablet (0.4 mg total) under the tongue every 5 (five) minutes as needed for chest pain (Do not take if blood pressure is low, systolic BP<110).   omega-3 acid ethyl esters (LOVAZA ) 1 g capsule TAKE 4 CAPSULES (4 GRAMS TOTAL) BY MOUTHDAILY   oxyCODONE -acetaminophen  (PERCOCET) 10-325 MG tablet TAKE 1 TABLET BY MOUTH EVERY 4 HOURS AS NEEDED FOR PAIN   OXYGEN  Inhale 2 L into the lungs at bedtime as needed (for shortness of breath).   pantoprazole  (PROTONIX ) 40 MG tablet TAKE 1 TABLET BY MOUTH 2 TIMES DAILY (30MINUTES BEFORE A MEAL)   ranolazine  (RANEXA ) 1000 MG SR tablet Take 1 tablet (1,000 mg total) by mouth 2 (two) times daily.   sacubitril -valsartan  (ENTRESTO ) 24-26 MG Take 1 tablet by mouth 2 (two) times daily.   sennosides-docusate sodium  (SENOKOT-S) 8.6-50 MG tablet Take 1 tablet by mouth daily.   sucralfate  (CARAFATE ) 1 GM/10ML suspension Take 10 mLs (1 g total) by mouth 4 (four) times daily.   traZODone  (DESYREL ) 100 MG tablet Take 1 tablet (100 mg total) by mouth at bedtime as needed for sleep.   spironolactone  (ALDACTONE ) 25 MG tablet Take 0.5 tablets (12.5 mg total) by mouth daily.   [DISCONTINUED] venlafaxine  (EFFEXOR ) 25 MG tablet TAKE 1 TABLET BY MOUTH DAILY   [DISCONTINUED] venlafaxine  XR (EFFEXOR -XR) 75 MG 24 hr capsule Take 75 mg by mouth daily with breakfast.   No facility-administered medications prior to visit.    Review of Systems     Objective    BP (!) 150/96 (BP Location: Left Arm, Patient Position: Sitting, Cuff Size: Normal)   Pulse 61   Resp 16   Wt 174 lb (78.9 kg)   SpO2 98%   BMI 27.25 kg/m    Physical Exam   General: Appearance:    Well developed, well nourished male in no acute distress  Eyes:    PERRL, conjunctiva/corneas clear, EOM's intact       Lungs:     Clear to auscultation bilaterally,  respirations unlabored  Heart:    Normal heart rate. Irregularly irregular rhythm. No murmurs, rubs, or gallops.    MS:   All extremities are intact.    Neurologic:   Awake, alert, oriented x 3. No apparent focal neurological defect.        Assessment & Plan     1. Depression, major, single episode, in partial remission (HCC) (Primary) Previously on 75mg  effexor  which was reduced to 25 in February. He sees Ruta Cousins, NP and states he needs to stop Effexor  before he can try another medication. Advised he can stop Effexor  any time prior to starting new medication.   2. Primary hypertension Increase from 1/2 tablet to spironolactone  (ALDACTONE ) 25 MG tablet; Take 1 tablet (25 mg  total) by mouth daily.  Dispense: 90 tablet; Refill: 2  3. Memory deficits  - Ambulatory referral to Neurology  4. Prostate cancer screening  - PSA Total (Reflex To Free)  5. OSA (obstructive sleep apnea) Is process of getting new CPAP.  6. Neuropathic pain of chest States he now only has pain when his BP runs high. Continue gabapentin , increase spironactone as above which will helpfully keep BP down.   7. Chronic combined systolic (congestive) and diastolic (congestive) heart failure (HCC) Continue current medications.  Increase spironolactone  as above.    Return in about 20 weeks (around 07/28/2024) for Hypertension.         Jeralene Mom, MD  [facility] [phone] (phone) [phone] (fax)  [facility]

## 2024-03-21 ENCOUNTER — Other Ambulatory Visit: Payer: Self-pay

## 2024-03-21 NOTE — Patient Instructions (Signed)
 Thank you for allowing the Chronic Care Management team to participate in your care. It was great speaking with you today! °

## 2024-03-21 NOTE — Patient Outreach (Unsigned)
 Complex Care Management   Visit Note  03/21/2024  Name:  Lawrence [name] MRN: [id number] DOB: 03/02/1956  Situation: Referral received for Complex Care Management related to *** I obtained verbal consent from Patient.  Visit completed with Lawrence [name] via telephone.  Background:   Past Medical History:  Diagnosis Date   A-fib (HCC)    Anemia    Anginal pain (HCC)    Anxiety    Arthritis    Asthma    CAD (coronary artery disease)    a. 2002 CABGx2 (LIMA->LAD, VG->VG->OM1);  b. 09/2012 DES->OM;  c. 03/2015 PTCA of LAD [facility]) in setting of atretic LIMA; d. 05/2015 Cath [facility]): nonobs dzs; e. 06/2015 Cath ([facility]): LM nl, LAD 45p/d ISR, 50d, D1/2 small, LCX 50p/d ISR, OM1 70ost, 30 ISR, VG->OM1 50ost, 25m, LIMA->LAD 99p/d - atretic, RCA dom, nl; f.cath 10/16: 40-50%(FFR 0.90) pLAD, 75% (FFR 0.77) mLAD s/p PCI/DES, oRCA 40% (FFR0.95)   Cancer (HCC)    SKIN CANCER ON BACK   Celiac disease    Chronic diastolic CHF (congestive heart failure) (HCC)    a. 06/2009 Echo: EF 60-65%, Gr 1 DD, triv AI, mildly dil LA, nl RV.   COPD (chronic obstructive pulmonary disease) (HCC)    a. Chronic bronchitis and emphysema.   DDD (degenerative disc disease), lumbar    Diverticulosis    Dysrhythmia    Essential hypertension    GERD (gastroesophageal reflux disease)    History of hiatal hernia    History of kidney stones    H/O   History of tobacco abuse    a. Quit 2014.   Myocardial infarction [facility]) 2002   4 STENTS   Pancreatitis    PSVT (paroxysmal supraventricular tachycardia) (HCC)    a. 10/2012 Noted on Zio Patch.   Sleep apnea    LOST CORD TO CPAP -ONLY 02 @ BEDTIME   Tubular adenoma of colon    Type II diabetes mellitus (HCC)     Assessment: Patient Reported Symptoms:  Cognitive        Neurological      HEENT        Cardiovascular      Respiratory      Endocrine      Gastrointestinal        Genitourinary      Integumentary      Musculoskeletal          Psychosocial               03/14/2024    4:07 PM  Depression screen PHQ 2/9  Decreased Interest 1  Down, Depressed, Hopeless 1  PHQ - 2 Score 2  Altered sleeping 0  Tired, decreased energy 1  Change in appetite 0  Feeling bad or failure about yourself  0  Trouble concentrating 0  Moving slowly or fidgety/restless 0  Suicidal thoughts 0  PHQ-9 Score 3  Difficult doing work/chores Not difficult at all    There were no vitals filed for this visit.  Medications Reviewed Today     Reviewed by Roxie Cord, RN (Registered Nurse) on 03/21/24 at 2320  Med List Status: <None>   Medication Order Taking? Sig Documenting Provider Last Dose Status Informant  Accu-Chek Softclix Lancets lancets [id number]  Use as instructed to check sugar daily for type 2 diabetes. Lamon Pillow, MD  Active Pharmacy Records, Other  allopurinol  (ZYLOPRIM ) 300 MG tablet [id number]  Take 300 mg by mouth 2 (two) times daily. [provider]  Active  ALPRAZolam  (XANAX ) 1 MG tablet [id number] Yes Take 1 tablet (1 mg total) by mouth 3 (three) times daily as needed. Lamon Pillow, MD Taking Active Pharmacy Records, Other  aluminum -magnesium  hydroxide-simethicone  (MAALOX) 200-200-20 MG/5ML SUSP [id number]  Take 30 mLs by mouth 4 (four) times daily -  before meals and at bedtime. Jacquie Maudlin, MD  Active   amLODipine  (NORVASC ) 10 MG tablet [id number] Yes Take 10 mg by mouth daily. [provider] Taking Active Self  apixaban  (ELIQUIS ) 5 MG TABS tablet [id number] Yes Take 1 tablet (5 mg total) by mouth 2 (two) times daily. Lamon Pillow, MD Taking Active Pharmacy Records, Other  atorvastatin  (LIPITOR ) 80 MG tablet [id number] Yes TAKE 1 TABLET BY MOUTH AT BEDTIME Lamon Pillow, MD Taking Active Pharmacy Records, Other  Blood Glucose Calibration (ACCU-CHEK GUIDE CONTROL) LIQD [id number]  Use with blood glucose monitor as directed Lamon Pillow, MD  Active   Blood Glucose Monitoring Suppl (ACCU-CHEK GUIDE)  w/Device KIT [id number]  Use to check blood sugars as directed Lamon Pillow, MD  Active   Budeson-Glycopyrrol-Formoterol  (BREZTRI  AEROSPHERE) 160-9-4.8 MCG/ACT Sudie Ely [id number] Yes Inhale 2 puffs into the lungs 2 (two) times daily. Lamon Pillow, MD Taking Active Pharmacy Records, Other  carvedilol  (COREG ) 3.125 MG tablet [id number]  Take 3.125 mg by mouth 2 (two) times daily with a meal. [provider]  Consider Medication Status and Discontinue (Duplicate)   carvedilol  (COREG ) 3.125 MG tablet [id number] Yes TAKE 1 TABLET BY MOUTH TWICE A DAY Lamon Pillow, MD Taking Active   cetirizine  (ZYRTEC ) 10 MG tablet [id number]  TAKE 1 TABLET BY MOUTH AT BEDTIME Lamon Pillow, MD  Active Pharmacy Records, Other           Med Note Nickola Baron Oct 18, 2023 10:05 PM) Last filled 4D supply in June  Cyanocobalamin  (VITAMIN B-12) 1000 MCG SUBL [id number] Yes Place 1 tablet under the tongue daily. [provider] Taking Active Pharmacy Records, Other  famotidine  (PEPCID ) 20 MG tablet [id number] Yes Take 1 tablet (20 mg total) by mouth 2 (two) times daily for 15 days. Jacquie Maudlin, MD Taking Active   FARXIGA  10 MG TABS tablet [id number] Yes TAKE 1 TABLET BY MOUTH DAILY Lamon Pillow, MD Taking Active   gabapentin  (NEURONTIN ) 300 MG capsule [id number] Yes Take 1 capsule at bedtime for 1 week, then increase to 1 twice a day Lamon Pillow, MD Taking Active   glucose blood (ACCU-CHEK GUIDE) test strip [id number]  Use as instructed to check sugar daily for type 2 diabetes. Lamon Pillow, MD  Active Pharmacy Records, Other  hydrOXYzine  (VISTARIL ) 25 MG capsule [id number]  Take 1 capsule (25 mg total) by mouth 3 (three) times daily as needed. Lubertha Rush, MD  Active   iron  polysaccharides (FERREX 150) 150 MG capsule [id number] Yes TAKE 1 CAPSULE BY MOUTH DAILY Lamon Pillow, MD Taking Active Pharmacy Records, Other  isosorbide  mononitrate (IMDUR ) 120 MG 24 hr tablet [id number] Yes  Take 1 tablet (120 mg total) by mouth daily. Althia Atlas, MD Taking Active   lidocaine  (LIDODERM ) 5 % [id number]  Place 1 patch onto the skin daily. Remove & Discard patch within 12 hours or as directed by MD Luna Salinas, MD  Active Pharmacy Records, Other  metFORMIN  (GLUCOPHAGE -XR) 500 MG 24 hr tablet [id number] Yes TAKE 1 TABLET BY MOUTH DAILY WITH EVENING MEAL Lamon Pillow, MD Taking Active   methocarbamol  (ROBAXIN ) 500 MG tablet  [id number]  TAKE 1 TABLET BY MOUTH EVERY 8 HOURS AS NEEDED FOR MUSCLE SPASMS Lamon Pillow, MD  Active   metoCLOPramide  (REGLAN ) 10 MG tablet [id number] Yes Take 1 tablet (10 mg total) by mouth 4 (four) times daily -  before meals and at bedtime for 15 days. Jacquie Maudlin, MD Taking Active   nitroGLYCERIN  (NITROSTAT ) 0.4 MG SL tablet [id number]  Place 1 tablet (0.4 mg total) under the tongue every 5 (five) minutes as needed for chest pain (Do not take if blood pressure is low, systolic BP<110). Sheril Dines, MD  Active            Med Note Dessa Floss, [facility]   Tue Mar 21, 2024 12:58 PM) Needs new order  omega-3 acid ethyl esters (LOVAZA ) 1 g capsule [id number] Yes TAKE 4 CAPSULES (4 GRAMS TOTAL) BY Suzzanna Estimable, MD Taking Active   oxyCODONE -acetaminophen  (PERCOCET) 10-325 MG tablet [id number]  TAKE 1 TABLET BY MOUTH EVERY 4 HOURS AS NEEDED FOR PAIN Lamon Pillow, MD  Active   OXYGEN  [id number] Yes Inhale 2 L into the lungs at bedtime as needed (for shortness of breath). [provider] Taking Active Pharmacy Records, Other  pantoprazole  (PROTONIX ) 40 MG tablet [id number] Yes TAKE 1 TABLET BY MOUTH 2 TIMES DAILY (30MINUTES BEFORE A MEAL) Lamon Pillow, MD Taking Active   ranolazine  (RANEXA ) 1000 MG SR tablet [id number] Yes Take 1 tablet (1,000 mg total) by mouth 2 (two) times daily. Aisha Hove, MD Taking Active Pharmacy Records, Other  sacubitril -valsartan  (ENTRESTO ) 24-26 MG [id number] Yes Take 1 tablet by mouth 2 (two) times  daily. [provider] Taking Active Self  sennosides-docusate sodium  (SENOKOT-S) 8.6-50 MG tablet [id number] Yes Take 1 tablet by mouth daily. [provider] Taking Active Self           Med Note Dessa Floss, [facility]   Tue Mar 21, 2024 12:59 PM) Reports taking as needed  spironolactone  (ALDACTONE ) 25 MG tablet [id number] Yes Take 1 tablet (25 mg total) by mouth daily. Lamon Pillow, MD Taking Active   sucralfate  (CARAFATE ) 1 GM/10ML suspension [id number] Yes Take 10 mLs (1 g total) by mouth 4 (four) times daily. Buell Carmin, MD Taking Active   traZODone  (DESYREL ) 100 MG tablet [id number] Yes Take 1 tablet (100 mg total) by mouth at bedtime as needed for sleep. Lamon Pillow, MD Taking Active             Recommendation:   {RECOMMENDATONS:32554}  Follow Up Plan:   {FOLLOWUP:32559}  SIG ***

## 2024-03-22 ENCOUNTER — Other Ambulatory Visit: Payer: Self-pay | Admitting: Family Medicine

## 2024-03-22 ENCOUNTER — Other Ambulatory Visit: Payer: Self-pay | Admitting: *Deleted

## 2024-03-22 NOTE — Patient Outreach (Unsigned)
 Complex Care Management   Visit Note  03/22/2024  Name:  Lawrence [name] MRN: [id number] DOB: [birth year]  Situation: Referral received for Complex Care Management related to {Criteria:32550} I obtained verbal consent from {CHL AMB Patient/Caregiver:28184}.  Visit completed with ***  {VISIT LOCATION:32553}  Background:   Past Medical History:  Diagnosis Date   A-fib (HCC)    Anemia    Anginal pain (HCC)    Anxiety    Arthritis    Asthma    CAD (coronary artery disease)    a. 2002 CABGx2 (LIMA->LAD, VG->VG->OM1);  b. 09/2012 DES->OM;  c. 03/2015 PTCA of LAD [facility]) in setting of atretic LIMA; d. 05/2015 Cath [facility]): nonobs dzs; e. 06/2015 Cath ([facility]): LM nl, LAD 45p/d ISR, 50d, D1/2 small, LCX 50p/d ISR, OM1 70ost, 30 ISR, VG->OM1 50ost, 4m, LIMA->LAD 99p/d - atretic, RCA dom, nl; f.cath 10/16: 40-50%(FFR 0.90) pLAD, 75% (FFR 0.77) mLAD s/p PCI/DES, oRCA 40% (FFR0.95)   Cancer (HCC)    SKIN CANCER ON BACK   Celiac disease    Chronic diastolic CHF (congestive heart failure) (HCC)    a. 06/2009 Echo: EF 60-65%, Gr 1 DD, triv AI, mildly dil LA, nl RV.   COPD (chronic obstructive pulmonary disease) (HCC)    a. Chronic bronchitis and emphysema.   DDD (degenerative disc disease), lumbar    Diverticulosis    Dysrhythmia    Essential hypertension    GERD (gastroesophageal reflux disease)    History of hiatal hernia    History of kidney stones    H/O   History of tobacco abuse    a. Quit 2014.   Myocardial infarction [facility]) 2002   4 STENTS   Pancreatitis    PSVT (paroxysmal supraventricular tachycardia) (HCC)    a. 10/2012 Noted on Zio Patch.   Sleep apnea    LOST CORD TO CPAP -ONLY 02 @ BEDTIME   Tubular adenoma of colon    Type II diabetes mellitus (HCC)     Assessment: Patient Reported Symptoms:  Cognitive Cognitive Status: Alert and oriented to person, place, and time, Normal speech and language skills, Struggling with memory recall Cognitive/Intellectual Conditions Management  [RPT]: None reported or documented in medical history or problem list   Health Maintenance Behaviors: Stress management Healing Pattern: Unsure Health Facilitated by: Stress management  Neurological      HEENT        Cardiovascular Cardiovascular Symptoms Reported: Fatigue Does patient have uncontrolled Hypertension?: Yes Is patient checking Blood Pressure at home?: Yes Patient's Recent BP reading at home: 115/80 yesterday Cardiovascular Conditions: Dysrhythmia, Heart failure, High blood cholesterol, Myocardial infarction Cardiovascular Management Strategies: Medical device, Medication therapy, Routine screening, Weight management Do You Have a Working Readable Scale?: Yes Weight: 170 lb (77.1 kg) Cardiovascular Self-Management Outcome: 4 (good)  Respiratory      Endocrine Patient reports the following symptoms related to hypoglycemia or hyperglycemia : No symptoms reported Is patient diabetic?: Yes Is patient checking blood sugars at home?: Yes Endocrine Conditions: Diabetes Endocrine Management Strategies: Medical device, Medication therapy Endocrine Self-Management Outcome: 4 (good)  Gastrointestinal        Genitourinary      Integumentary      Musculoskeletal          Psychosocial Psychosocial Symptoms Reported: Sadness - if selected complete PHQ 2-9, Anxiety - if selected complete GAD Behavioral Health Conditions: Depression, Anxiety Behavioral Management Strategies: Medication therapy, Coping strategies, Counseling, Support system Behavioral Health Comment: Reports that medications are prescribed by Hillary Lowing  through [facility] -reports that was seen by him today-medications continue to be adjusted Major Change/Loss/Stressor/Fears (CP): Death of a loved one, Medical condition, self Behaviors When Feeling Stressed/Fearful: Per patient, he tends to withdraw when depressed and anxious, neighbor/friend checks on him regularly and encourages, takes things "one day  at a time" also has therapist [place] -will call to schedule a follow up appoointment Techniques to Cope with Loss/Stress/Change: Diversional activities, Counseling, Medication Quality of Family Relationships: supportive Do you feel physically threatened by others?: No      03/22/2024    1:51 PM  Depression screen PHQ 2/9  Decreased Interest 1  Down, Depressed, Hopeless 1  PHQ - 2 Score 2  Tired, decreased energy 1  Change in appetite 0  Feeling bad or failure about yourself  0  Trouble concentrating 0  Moving slowly or fidgety/restless 0  Suicidal thoughts 0    There were no vitals filed for this visit.  Medications Reviewed Today     Reviewed by Ave Leisure, LCSW (Social Worker) on 03/22/24 at 1404  Med List Status: <None>   Medication Order Taking? Sig Documenting Provider Last Dose Status Informant  Accu-Chek Softclix Lancets lancets [id number] Yes Use as instructed to check sugar daily for type 2 diabetes. Lamon Pillow, MD Taking Active Pharmacy Records, Other  allopurinol  (ZYLOPRIM ) 300 MG tablet [id number] Yes Take 300 mg by mouth 2 (two) times daily. [provider] Taking Active   ALPRAZolam  (XANAX ) 1 MG tablet [id number] Yes Take 1 tablet (1 mg total) by mouth 3 (three) times daily as needed. Lamon Pillow, MD Taking Active Pharmacy Records, Other  aluminum -magnesium  hydroxide-simethicone  (MAALOX) 200-200-20 MG/5ML SUSP [id number] No Take 30 mLs by mouth 4 (four) times daily -  before meals and at bedtime.  Patient not taking: Reported on 03/22/2024   Jacquie Maudlin, MD Not Taking Active   amLODipine  (NORVASC ) 10 MG tablet [id number] Yes Take 10 mg by mouth daily. [provider] Taking Active Self  apixaban  (ELIQUIS ) 5 MG TABS tablet [id number] Yes Take 1 tablet (5 mg total) by mouth 2 (two) times daily. Lamon Pillow, MD Taking Active Pharmacy Records, Other  atorvastatin  (LIPITOR ) 80 MG tablet [id number] Yes TAKE 1 TABLET BY MOUTH AT  BEDTIME Lamon Pillow, MD Taking Active Pharmacy Records, Other  Blood Glucose Calibration (ACCU-CHEK GUIDE CONTROL) Biagio Bucy [id number] Yes Use with blood glucose monitor as directed Lamon Pillow, MD Taking Active   Blood Glucose Monitoring Suppl (ACCU-CHEK GUIDE) w/Device KIT [id number] Yes Use to check blood sugars as directed Lamon Pillow, MD Taking Active   Budeson-Glycopyrrol-Formoterol  (BREZTRI  AEROSPHERE) 160-9-4.8 MCG/ACT AERO [id number] Yes Inhale 2 puffs into the lungs 2 (two) times daily. Lamon Pillow, MD Taking Active Pharmacy Records, Other  carvedilol  (COREG ) 3.125 MG tablet [id number] Yes Take 3.125 mg by mouth 2 (two) times daily with a meal. [provider] Taking Active   carvedilol  (COREG ) 3.125 MG tablet [id number] Yes TAKE 1 TABLET BY MOUTH TWICE A DAY Lamon Pillow, MD Taking Active   cetirizine  (ZYRTEC ) 10 MG tablet [id number] No TAKE 1 TABLET BY MOUTH AT BEDTIME  Patient not taking: Reported on 03/22/2024   Lamon Pillow, MD Not Taking Active Pharmacy Records, Other           Med Note Nickola Baron Oct 18, 2023 10:05 PM) Last filled 4D supply in June  Cyanocobalamin  (VITAMIN B-12) 1000 MCG SUBL [id number] Yes Place 1 tablet under  the tongue daily. [provider] Taking Active Pharmacy Records, Other  famotidine  (PEPCID ) 20 MG tablet [id number] Yes Take 1 tablet (20 mg total) by mouth 2 (two) times daily for 15 days. Jacquie Maudlin, MD Taking Active   FARXIGA  10 MG TABS tablet [id number] Yes TAKE 1 TABLET BY MOUTH DAILY Lamon Pillow, MD Taking Active   gabapentin  (NEURONTIN ) 300 MG capsule [id number]  Take 1 capsule at bedtime for 1 week, then increase to 1 twice a day Lamon Pillow, MD  Active   glucose blood (ACCU-CHEK GUIDE) test strip [id number] Yes Use as instructed to check sugar daily for type 2 diabetes. Lamon Pillow, MD Taking Active Pharmacy Records, Other  hydrOXYzine  (VISTARIL ) 25 MG capsule [id number] Yes Take 1 capsule  (25 mg total) by mouth 3 (three) times daily as needed. Lubertha Rush, MD Taking Active   iron  polysaccharides (FERREX 150) 150 MG capsule [id number] Yes TAKE 1 CAPSULE BY MOUTH DAILY Lamon Pillow, MD Taking Active Pharmacy Records, Other  isosorbide  mononitrate (IMDUR ) 120 MG 24 hr tablet [id number] Yes Take 1 tablet (120 mg total) by mouth daily. Althia Atlas, MD Taking Active   lidocaine  (LIDODERM ) 5 % [id number] No Place 1 patch onto the skin daily. Remove & Discard patch within 12 hours or as directed by MD  Patient not taking: Reported on 03/22/2024   Luna Salinas, MD Not Taking Active Pharmacy Records, Other  metFORMIN  (GLUCOPHAGE -XR) 500 MG 24 hr tablet [id number] Yes TAKE 1 TABLET BY MOUTH DAILY WITH EVENING MEAL Lamon Pillow, MD Taking Active   methocarbamol  (ROBAXIN ) 500 MG tablet [id number] Yes TAKE 1 TABLET BY MOUTH EVERY 8 HOURS AS NEEDED FOR MUSCLE SPASMS Lamon Pillow, MD Taking Active   metoCLOPramide  (REGLAN ) 10 MG tablet [id number] Yes Take 1 tablet (10 mg total) by mouth 4 (four) times daily -  before meals and at bedtime for 15 days. Jacquie Maudlin, MD Taking Active   nitroGLYCERIN  (NITROSTAT ) 0.4 MG SL tablet [id number] Yes Place 1 tablet (0.4 mg total) under the tongue every 5 (five) minutes as needed for chest pain (Do not take if blood pressure is low, systolic BP<110). Sheril Dines, MD Taking Active            Med Note Dessa Floss, [facility]   Tue Mar 21, 2024 12:58 PM) Needs new order  omega-3 acid ethyl esters (LOVAZA ) 1 g capsule [id number] Yes TAKE 4 CAPSULES (4 GRAMS TOTAL) BY Suzzanna Estimable, MD Taking Active   oxyCODONE -acetaminophen  (PERCOCET) 10-325 MG tablet [id number] Yes TAKE 1 TABLET BY MOUTH EVERY 4 HOURS AS NEEDED FOR PAIN Lamon Pillow, MD Taking Active   OXYGEN  [id number] Yes Inhale 2 L into the lungs at bedtime as needed (for shortness of breath). [provider] Taking Active Pharmacy Records, Other  pantoprazole  (PROTONIX ) 40 MG  tablet [id number] Yes TAKE 1 TABLET BY MOUTH 2 TIMES DAILY (30MINUTES BEFORE A MEAL) Lamon Pillow, MD Taking Active   ranolazine  (RANEXA ) 1000 MG SR tablet [id number] Yes Take 1 tablet (1,000 mg total) by mouth 2 (two) times daily. Aisha Hove, MD Taking Active Pharmacy Records, Other  sacubitril -valsartan  (ENTRESTO ) 24-26 MG [id number] Yes Take 1 tablet by mouth 2 (two) times daily. [provider] Taking Active Self  sennosides-docusate sodium  (SENOKOT-S) 8.6-50 MG tablet [id number] Yes Take 1 tablet by mouth daily. [provider] Taking Active Self           Med Note (Dessa Floss, [facility]  Tue Mar 21, 2024 12:59 PM) Reports taking as needed  spironolactone  (ALDACTONE ) 25 MG tablet [id number] Yes Take 1 tablet (25 mg total) by mouth daily. Lamon Pillow, MD Taking Active   sucralfate  (CARAFATE ) 1 GM/10ML suspension [id number] Yes Take 10 mLs (1 g total) by mouth 4 (four) times daily. Buell Carmin, MD Taking Active   traZODone  (DESYREL ) 100 MG tablet [id number] Yes Take 1 tablet (100 mg total) by mouth at bedtime as needed for sleep. Lamon Pillow, MD Taking Active             Recommendation:   {RECOMMENDATONS:32554}  Follow Up Plan:   {FOLLOWUP:32559}  SIG ***

## 2024-03-23 ENCOUNTER — Other Ambulatory Visit: Payer: Self-pay | Admitting: Family Medicine

## 2024-03-23 NOTE — Telephone Encounter (Signed)
 Copied from CRM [id number]. Topic: Clinical - Medication Refill >> Mar 23, 2024  1:42 PM Jeris Montes S wrote: Medication: famotidine  (PEPCID ) 20 MG tablet  Has the patient contacted their pharmacy? Yes (Agent: If no, request that the patient contact the pharmacy for the refill. If patient does not wish to contact the pharmacy document the reason why and proceed with request.) (Agent: If yes, when and what did the pharmacy advise?)  This is the patient's preferred pharmacy:  [facility] - [place], [place] - [address] [place] [address] Phone: [phone] Fax: [phone]  Is this the correct pharmacy for this prescription? Yes If no, delete pharmacy and type the correct one.   Has the prescription been filled recently? No  Is the patient out of the medication? Yes  Has the patient been seen for an appointment in the last year OR does the patient have an upcoming appointment? Yes  Can we respond through MyChart? Yes  Agent: Please be advised that Rx refills may take up to 3 business days. We ask that you follow-up with your pharmacy.

## 2024-03-23 NOTE — Patient Instructions (Signed)
 Visit Information  Thank you for taking time to visit with me today. Please don't hesitate to contact me if I can be of assistance to you before our next scheduled appointment.  - No follow up appointment required- Goals Met Following is a copy of your care plan:   Goals Addressed             This Visit's Progress    [facility] Social Work Care Plan       Problems:   Disease Management support and education needs related to Anxiety with Excessive Worry, and Depression: depressed mood  CSW Clinical Goal(s):   Over the next 90 days the Patient will demonstrate a reduction in symptoms related to Anxiety with Excessive Worry, Depression: disturbed sleep loss of energy/fatigue panic attacks .  Interventions:  Mental Health:  Evaluation of current treatment plan related to Depression and Anxiety Active listening / Reflection utilized Behavioral Activation reviewed Emotional Support Provided Motivational Interviewing employed PHQ2/PHQ9 completed Solution-Focued Strategies employed: adjusting perspective, use of positive self talk, increasing opportunities for socialization and consistent follow up with mental health providers.  Patient Goals/Self-Care Activities:  Continue taking your medication as prescribed.   Continue active participation with [facility] for ongoing mental health support  Plan:   No further follow up required: patient to contact this social worker with any additional community resource needs        Please call the Suicide and Crisis Lifeline: 988 if you are experiencing a Mental Health or Behavioral Health Crisis or need someone to talk to.  Patient verbalizes understanding of instructions and care plan provided today and agrees to view in MyChart. Active MyChart status and patient understanding of how to access instructions and care plan via MyChart confirmed with patient.     Mikela Senn, LCSW   [facility], [facility]  Health Licensed Clinical Social Worker Care Coordinator  Direct Dial: [phone]

## 2024-03-23 NOTE — Telephone Encounter (Signed)
 Patient advised. Scheduled to have a visit about medical necessity

## 2024-03-23 NOTE — Patient Outreach (Addendum)
 Complex Care Management   Visit Note  03/23/2024 late entry  Name:  Lawrence [name] MRN: [id number] DOB: 21-Feb-1956  Situation: Referral received for Complex Care Management related to SDOH Barriers:  Housing patient currently on waiting list for [facility], currently is #4 on list Depression -patient now active with [facility] for ongoing mental health counseling. I obtained verbal consent from Patient.  Visit completed with patient  on the phone on 03/22/24  Background:   Past Medical History:  Diagnosis Date   A-fib (HCC)    Anemia    Anginal pain (HCC)    Anxiety    Arthritis    Asthma    CAD (coronary artery disease)    a. 2002 CABGx2 (LIMA->LAD, VG->VG->OM1);  b. 09/2012 DES->OM;  c. 03/2015 PTCA of LAD [facility]) in setting of atretic LIMA; d. 05/2015 Cath [facility]): nonobs dzs; e. 06/2015 Cath ([facility]): LM nl, LAD 45p/d ISR, 50d, D1/2 small, LCX 50p/d ISR, OM1 70ost, 30 ISR, VG->OM1 50ost, 97m, LIMA->LAD 99p/d - atretic, RCA dom, nl; f.cath 10/16: 40-50%(FFR 0.90) pLAD, 75% (FFR 0.77) mLAD s/p PCI/DES, oRCA 40% (FFR0.95)   Cancer (HCC)    SKIN CANCER ON BACK   Celiac disease    Chronic diastolic CHF (congestive heart failure) (HCC)    a. 06/2009 Echo: EF 60-65%, Gr 1 DD, triv AI, mildly dil LA, nl RV.   COPD (chronic obstructive pulmonary disease) (HCC)    a. Chronic bronchitis and emphysema.   DDD (degenerative disc disease), lumbar    Diverticulosis    Dysrhythmia    Essential hypertension    GERD (gastroesophageal reflux disease)    History of hiatal hernia    History of kidney stones    H/O   History of tobacco abuse    a. Quit 2014.   Myocardial infarction [facility]) 2002   4 STENTS   Pancreatitis    PSVT (paroxysmal supraventricular tachycardia) (HCC)    a. 10/2012 Noted on Zio Patch.   Sleep apnea    LOST CORD TO CPAP -ONLY 02 @ BEDTIME   Tubular adenoma of colon    Type II diabetes mellitus (HCC)     Assessment: Patient Reported Symptoms:  Cognitive Cognitive Status:  Alert and oriented to person, place, and time, Normal speech and language skills, Struggling with memory recall Cognitive/Intellectual Conditions Management [RPT]: None reported or documented in medical history or problem list   Health Maintenance Behaviors: Stress management Healing Pattern: Unsure Health Facilitated by: Stress management  Neurological Neurological Review of Symptoms: No symptoms reported Neurological Comment: per patient, patient has a follow up appointment with Dr. Mason Sole on 06/07/24 to assress concerns related to memory loss  HEENT HEENT Symptoms Reported: No symptoms reported      Cardiovascular Cardiovascular Symptoms Reported: Fatigue Does patient have uncontrolled Hypertension?: Yes Is patient checking Blood Pressure at home?: Yes Patient's Recent BP reading at home: 115/80 yesterday Cardiovascular Conditions: Dysrhythmia, Heart failure, High blood cholesterol, Myocardial infarction Cardiovascular Management Strategies: Medical device, Medication therapy, Routine screening, Weight management Do You Have a Working Readable Scale?: Yes Weight: 170 lb (77.1 kg) Cardiovascular Self-Management Outcome: 4 (good)  Respiratory Respiratory Symptoms Reported: No symptoms reported    Endocrine Patient reports the following symptoms related to hypoglycemia or hyperglycemia : No symptoms reported Is patient diabetic?: Yes Is patient checking blood sugars at home?: Yes Endocrine Conditions: Diabetes Endocrine Management Strategies: Medical device, Medication therapy Endocrine Self-Management Outcome: 4 (good)  Gastrointestinal Gastrointestinal Symptoms Reported: No symptoms reported  Genitourinary Genitourinary Symptoms Reported: Incontinence Genitourinary Management Strategies: Incontinence garment/pad, Medication therapy Genitourinary Self-Management Outcome: 4 (good)  Integumentary Integumentary Symptoms Reported: No symptoms reported    Musculoskeletal  Musculoskelatal Symptoms Reviewed: Difficulty walking, Unsteady gait Musculoskeletal Conditions: Joint pain, Mobility limited, Unsteady gait Musculoskeletal Management Strategies: Medication therapy, Routine screening, Medical device Musculoskeletal Self-Management Outcome: 3 (uncertain) Falls in the past year?: Yes Number of falls in past year: 2 or more Was there an injury with Fall?: Yes Fall Risk Category Calculator: 3 Patient Fall Risk Level: High Fall Risk Patient at Risk for Falls Due to: Impaired balance/gait, Impaired mobility, History of fall(s) Fall risk Follow up: Falls prevention discussed  Psychosocial Psychosocial Symptoms Reported: Sadness - if selected complete PHQ 2-9, Anxiety - if selected complete GAD Behavioral Health Conditions: Depression, Anxiety Behavioral Management Strategies: Medication therapy, Coping strategies, Counseling, Support system Behavioral Health Comment: Reports that medications are prescribed by Hillary Lowing through [facility] -reports that he was seen by him today-medications continue to be adjusted Major Change/Loss/Stressor/Fears (CP): Death of a loved one, Medical condition, self Behaviors When Feeling Stressed/Fearful: Per patient, he tends to withdraw when depressed and anxious, neighbor/friend checks on him regularly and encourages him to take things "one day at a time" also has therapist [place] -will call to schedule a follow up appoointment Techniques to Cope with Loss/Stress/Change: Diversional activities, Counseling, Medication Quality of Family Relationships: supportive Do you feel physically threatened by others?: No      03/22/2024    1:51 PM  Depression screen PHQ 2/9  Decreased Interest 1  Down, Depressed, Hopeless 1  PHQ - 2 Score 2  Tired, decreased energy 1  Change in appetite 0  Feeling bad or failure about yourself  0  Trouble concentrating 0  Moving slowly or fidgety/restless 0  Suicidal thoughts 0    There  were no vitals filed for this visit.  Medications Reviewed Today     Reviewed by Ave Leisure, LCSW (Social Worker) on 03/22/24 at 1404  Med List Status: <None>   Medication Order Taking? Sig Documenting Provider Last Dose Status Informant  Accu-Chek Softclix Lancets lancets [id number] Yes Use as instructed to check sugar daily for type 2 diabetes. Lamon Pillow, MD Taking Active Pharmacy Records, Other  allopurinol  (ZYLOPRIM ) 300 MG tablet [id number] Yes Take 300 mg by mouth 2 (two) times daily. [provider] Taking Active   ALPRAZolam  (XANAX ) 1 MG tablet [id number] Yes Take 1 tablet (1 mg total) by mouth 3 (three) times daily as needed. Lamon Pillow, MD Taking Active Pharmacy Records, Other  aluminum -magnesium  hydroxide-simethicone  (MAALOX) 200-200-20 MG/5ML SUSP [id number] No Take 30 mLs by mouth 4 (four) times daily -  before meals and at bedtime.  Patient not taking: Reported on 03/22/2024   Jacquie Maudlin, MD Not Taking Active   amLODipine  (NORVASC ) 10 MG tablet [id number] Yes Take 10 mg by mouth daily. [provider] Taking Active Self  apixaban  (ELIQUIS ) 5 MG TABS tablet [id number] Yes Take 1 tablet (5 mg total) by mouth 2 (two) times daily. Lamon Pillow, MD Taking Active Pharmacy Records, Other  atorvastatin  (LIPITOR ) 80 MG tablet [id number] Yes TAKE 1 TABLET BY MOUTH AT BEDTIME Lamon Pillow, MD Taking Active Pharmacy Records, Other  Blood Glucose Calibration (ACCU-CHEK GUIDE CONTROL) Biagio Bucy [id number] Yes Use with blood glucose monitor as directed Lamon Pillow, MD Taking Active   Blood Glucose Monitoring Suppl (ACCU-CHEK GUIDE) w/Device KIT [id number] Yes Use to check blood sugars as directed  Lamon Pillow, MD Taking Active   Budeson-Glycopyrrol-Formoterol  (BREZTRI  AEROSPHERE) 160-9-4.8 MCG/ACT Sudie Ely [id number] Yes Inhale 2 puffs into the lungs 2 (two) times daily. Lamon Pillow, MD Taking Active Pharmacy Records, Other  carvedilol  (COREG ) 3.125 MG  tablet [id number] Yes Take 3.125 mg by mouth 2 (two) times daily with a meal. [provider] Taking Active   carvedilol  (COREG ) 3.125 MG tablet [id number] Yes TAKE 1 TABLET BY MOUTH TWICE A DAY Lamon Pillow, MD Taking Active   cetirizine  (ZYRTEC ) 10 MG tablet [id number] No TAKE 1 TABLET BY MOUTH AT BEDTIME  Patient not taking: Reported on 03/22/2024   Lamon Pillow, MD Not Taking Active Pharmacy Records, Other           Med Note Nickola Baron Oct 18, 2023 10:05 PM) Last filled 4D supply in June  Cyanocobalamin  (VITAMIN B-12) 1000 MCG SUBL [id number] Yes Place 1 tablet under the tongue daily. [provider] Taking Active Pharmacy Records, Other  famotidine  (PEPCID ) 20 MG tablet [id number] Yes Take 1 tablet (20 mg total) by mouth 2 (two) times daily for 15 days. Jacquie Maudlin, MD Taking Active   FARXIGA  10 MG TABS tablet [id number] Yes TAKE 1 TABLET BY MOUTH DAILY Lamon Pillow, MD Taking Active   gabapentin  (NEURONTIN ) 300 MG capsule [id number]  Take 1 capsule at bedtime for 1 week, then increase to 1 twice a day Lamon Pillow, MD  Active   glucose blood (ACCU-CHEK GUIDE) test strip [id number] Yes Use as instructed to check sugar daily for type 2 diabetes. Lamon Pillow, MD Taking Active Pharmacy Records, Other  hydrOXYzine  (VISTARIL ) 25 MG capsule [id number] Yes Take 1 capsule (25 mg total) by mouth 3 (three) times daily as needed. Lubertha Rush, MD Taking Active   iron  polysaccharides (FERREX 150) 150 MG capsule [id number] Yes TAKE 1 CAPSULE BY MOUTH DAILY Lamon Pillow, MD Taking Active Pharmacy Records, Other  isosorbide  mononitrate (IMDUR ) 120 MG 24 hr tablet [id number] Yes Take 1 tablet (120 mg total) by mouth daily. Althia Atlas, MD Taking Active   lidocaine  (LIDODERM ) 5 % [id number] No Place 1 patch onto the skin daily. Remove & Discard patch within 12 hours or as directed by MD  Patient not taking: Reported on 03/22/2024   Luna Salinas, MD Not Taking Active  Pharmacy Records, Other  metFORMIN  (GLUCOPHAGE -XR) 500 MG 24 hr tablet [id number] Yes TAKE 1 TABLET BY MOUTH DAILY WITH EVENING MEAL Lamon Pillow, MD Taking Active   methocarbamol  (ROBAXIN ) 500 MG tablet [id number] Yes TAKE 1 TABLET BY MOUTH EVERY 8 HOURS AS NEEDED FOR MUSCLE SPASMS Lamon Pillow, MD Taking Active   metoCLOPramide  (REGLAN ) 10 MG tablet [id number] Yes Take 1 tablet (10 mg total) by mouth 4 (four) times daily -  before meals and at bedtime for 15 days. Jacquie Maudlin, MD Taking Active   nitroGLYCERIN  (NITROSTAT ) 0.4 MG SL tablet [id number] Yes Place 1 tablet (0.4 mg total) under the tongue every 5 (five) minutes as needed for chest pain (Do not take if blood pressure is low, systolic BP<110). Sheril Dines, MD Taking Active            Med Note Dessa Floss, [facility]   Tue Mar 21, 2024 12:58 PM) Needs new order  omega-3 acid ethyl esters (LOVAZA ) 1 g capsule [id number] Yes TAKE 4 CAPSULES (4 GRAMS TOTAL) BY Suzzanna Estimable, MD Taking Active   oxyCODONE -acetaminophen  (PERCOCET) 10-325 MG tablet [id number] Yes TAKE  1 TABLET BY MOUTH EVERY 4 HOURS AS NEEDED FOR PAIN Lamon Pillow, MD Taking Active   OXYGEN  [id number] Yes Inhale 2 L into the lungs at bedtime as needed (for shortness of breath). [provider] Taking Active Pharmacy Records, Other  pantoprazole  (PROTONIX ) 40 MG tablet [id number] Yes TAKE 1 TABLET BY MOUTH 2 TIMES DAILY (30MINUTES BEFORE A MEAL) Lamon Pillow, MD Taking Active   ranolazine  (RANEXA ) 1000 MG SR tablet [id number] Yes Take 1 tablet (1,000 mg total) by mouth 2 (two) times daily. Aisha Hove, MD Taking Active Pharmacy Records, Other  sacubitril -valsartan  (ENTRESTO ) 24-26 MG [id number] Yes Take 1 tablet by mouth 2 (two) times daily. [provider] Taking Active Self  sennosides-docusate sodium  (SENOKOT-S) 8.6-50 MG tablet [id number] Yes Take 1 tablet by mouth daily. [provider] Taking Active Self           Med  Note Dessa Floss, [facility]   Tue Mar 21, 2024 12:59 PM) Reports taking as needed  spironolactone  (ALDACTONE ) 25 MG tablet [id number] Yes Take 1 tablet (25 mg total) by mouth daily. Lamon Pillow, MD Taking Active   sucralfate  (CARAFATE ) 1 GM/10ML suspension [id number] Yes Take 10 mLs (1 g total) by mouth 4 (four) times daily. Buell Carmin, MD Taking Active   traZODone  (DESYREL ) 100 MG tablet [id number] Yes Take 1 tablet (100 mg total) by mouth at bedtime as needed for sleep. Lamon Pillow, MD Taking Active             Recommendation:   PCP Follow-up  Follow Up Plan:   Patient has met all care management goals. Care Management case will be closed. Patient has been provided contact information should new needs arise.   Crystelle Ferrufino, LCSW [place]  [facility], [facility] Licensed Clinical Social Worker Care Coordinator  Direct Dial: [phone]

## 2024-03-23 NOTE — Telephone Encounter (Signed)
 Those have to be ordered through a medicare certified durable medical equipment supplier and needs to have face-to-face office visit specifically to document medical necessity

## 2024-03-24 ENCOUNTER — Telehealth: Payer: Self-pay | Admitting: *Deleted

## 2024-03-24 ENCOUNTER — Telehealth: Payer: Self-pay | Admitting: Family Medicine

## 2024-03-24 DIAGNOSIS — I5023 Acute on chronic systolic (congestive) heart failure: Secondary | ICD-10-CM

## 2024-03-24 NOTE — Patient Outreach (Signed)
 Phone call from patient requesting food resources. Per patient, he is needing food resources. CSW will make referral for BSW to assess for SDOH need and available resources.   Lawrence Bohanon, LCSW [place]  [facility], [facility] Licensed Clinical Social Worker Care Coordinator  Direct Dial: [phone]

## 2024-03-24 NOTE — Telephone Encounter (Unsigned)
 Copied from CRM [id number]. Topic: Clinical - Medication Refill >> Mar 24, 2024 12:24 PM [facility] T wrote: Medication: metroNIDAZOLE  (METROGEL ) 0.75 % gel    Has the patient contacted their pharmacy? No (Agent: If no, request that the patient contact the pharmacy for the refill. If patient does not wish to contact the pharmacy document the reason why and proceed with request.) (Agent: If yes, when and what did the pharmacy advise?)  This is the patient's preferred pharmacy:  [facility] - [place], [place] - [address] [place] [address] Phone: [phone] Fax: [phone]    Is this the correct pharmacy for this prescription? Yes If no, delete pharmacy and type the correct one.   Has the prescription been filled recently? No  Is the patient out of the medication? Yes  Has the patient been seen for an appointment in the last year OR does the patient have an upcoming appointment? Yes  Can we respond through MyChart? No  Agent: Please be advised that Rx refills may take up to 3 business days. We ask that you follow-up with your pharmacy.

## 2024-03-27 ENCOUNTER — Telehealth: Payer: Self-pay

## 2024-03-27 NOTE — Progress Notes (Signed)
 Complex Care Management Note  Care Guide Note 03/27/2024 Name: Lawrence [name] MRN: [id number] DOB: 1955/11/24  Lawrence [name] is a 68 y.o. year old male who sees [name], Erlinda Haws, MD for primary care. I reached out to Guadlupe Leeds by phone today to offer complex care management services.  [name] was given information about Complex Care Management services today including:   The Complex Care Management services include support from the care team which includes your Nurse Care Manager, Clinical Social Worker, or Pharmacist.  The Complex Care Management team is here to help remove barriers to the health concerns and goals most important to you. Complex Care Management services are voluntary, and the patient may decline or stop services at any time by request to their care team member.   Complex Care Management Consent Status: Patient agreed to services and verbal consent obtained.   Follow up plan:  Telephone appointment with complex care management team member scheduled for:  04/07/2024  Encounter Outcome:  Patient Scheduled  Lenton Rail , RMA     [place]  [facility], [facility] Guide  Direct Dial: [phone]  Website: [email or web address]

## 2024-03-27 NOTE — Telephone Encounter (Signed)
 Requested medications are due for refill today.  Unsure - pt was to take medication for 2 weeks and then stop.  Requested medications are on the active medications list.  yes  Last refill. 03/12/2024 #30 0 rf  Future visit scheduled.   yes  Notes to clinic.  Rx signed by another provider.     Requested Prescriptions  Pending Prescriptions Disp Refills   famotidine  (PEPCID ) 20 MG tablet 30 tablet 0    Sig: Take 1 tablet (20 mg total) by mouth 2 (two) times daily for 15 days.     Gastroenterology:  H2 Antagonists Passed - 03/27/2024 10:08 AM      Passed - Valid encounter within last 12 months    Recent Outpatient Visits           2 weeks ago Depression, major, single episode, in partial remission [facility])    [facility] Lamon Pillow, MD   2 months ago Neuropathic pain of chest   [facility] Lamon Pillow, MD   2 months ago Other chest pain   [facility] Lamon Pillow, MD

## 2024-03-28 ENCOUNTER — Other Ambulatory Visit: Payer: Self-pay | Admitting: Family Medicine

## 2024-03-28 ENCOUNTER — Telehealth: Payer: Self-pay

## 2024-03-28 ENCOUNTER — Emergency Department
Admission: EM | Admit: 2024-03-28 | Discharge: 2024-03-29 | Disposition: A | Discharge status: Home / Self Care | Attending: Emergency Medicine | Admitting: Emergency Medicine

## 2024-03-28 ENCOUNTER — Emergency Department

## 2024-03-28 DIAGNOSIS — J449 Chronic obstructive pulmonary disease, unspecified: Secondary | ICD-10-CM | POA: Diagnosis not present

## 2024-03-28 DIAGNOSIS — R42 Dizziness and giddiness: Secondary | ICD-10-CM | POA: Insufficient documentation

## 2024-03-28 DIAGNOSIS — I2510 Atherosclerotic heart disease of native coronary artery without angina pectoris: Secondary | ICD-10-CM | POA: Diagnosis not present

## 2024-03-28 DIAGNOSIS — Z7901 Long term (current) use of anticoagulants: Secondary | ICD-10-CM | POA: Diagnosis not present

## 2024-03-28 DIAGNOSIS — R051 Acute cough: Secondary | ICD-10-CM | POA: Insufficient documentation

## 2024-03-28 DIAGNOSIS — W01198A Fall on same level from slipping, tripping and stumbling with subsequent striking against other object, initial encounter: Secondary | ICD-10-CM | POA: Insufficient documentation

## 2024-03-28 DIAGNOSIS — W19XXXA Unspecified fall, initial encounter: Secondary | ICD-10-CM

## 2024-03-28 NOTE — ED Triage Notes (Signed)
 Pt presents to the ED via POV with complaints of lower back and r lower leg pain following a fall tonight. He was turning around in the restroom, hitting his leg on the commode and falling backwards hitting his head against the wall and his back. Endorses taking Eliquis . No LOC. A&Ox4 at this time. Denies CP or SOB.

## 2024-03-28 NOTE — Progress Notes (Signed)
 Complex Care Management Note Care Guide Note  03/28/2024 Name: Lawrence [name] MRN: [id number] DOB: 09/18/1956  [name] is a 68 y.o. year old male who is a primary care patient of [name], Erlinda Haws, MD . The community resource team was consulted for assistance with Transportation Needs  and Food Insecurity  SDOH screenings and interventions completed:  Yes  Social Drivers of Health From This Encounter   Food Insecurity: No Food Insecurity (03/28/2024)   Hunger Vital Sign    Worried About Running Out of Food in the Last Year: Never true    Ran Out of Food in the Last Year: Never true  Housing: Low Risk  (03/28/2024)   Housing Stability Vital Sign    Unable to Pay for Housing in the Last Year: No    Number of Times Moved in the Last Year: 0    Homeless in the Last Year: No  Financial Resource Strain: Low Risk  (03/28/2024)   Overall Financial Resource Strain (CARDIA)    Difficulty of Paying Living Expenses: Not hard at all  Transportation Needs: No Transportation Needs (03/28/2024)   PRAPARE - Administrator, Civil Service (Medical): No    Lack of Transportation (Non-Medical): No  Utilities: Not At Risk (03/28/2024)   Utilities    Threatened with loss of utilities: No    SDOH Interventions Today    Flowsheet Row Most Recent Value  SDOH Interventions   Food Insecurity Interventions Other (Comment)  [Patient is receiving Meals on Wheels and is awaiting SNAP benefit approval.Verified patient's home address to mail food pantry list.]  Transportation Interventions Other (Comment), Payor Benefit  [Patient has Medicaid transportation for medical apppointments. Gave number for [employer] transportation.]        Care guide performed the following interventions: Patient provided with information about care guide support team and interviewed to confirm resource needs.  Follow Up Plan:  No further follow up planned at this time. The patient has been provided with needed  resources.  Encounter Outcome:  Patient Visit Completed  Chandell Attridge [facility]  [facility] Guide Direct Dial: [phone]  Fax: [phone] Website: [email or web address]

## 2024-03-29 ENCOUNTER — Encounter: Payer: Self-pay | Admitting: Emergency Medicine

## 2024-03-29 ENCOUNTER — Emergency Department

## 2024-03-29 ENCOUNTER — Other Ambulatory Visit: Payer: Self-pay | Admitting: Family Medicine

## 2024-03-29 DIAGNOSIS — R42 Dizziness and giddiness: Secondary | ICD-10-CM | POA: Diagnosis not present

## 2024-03-29 LAB — CBC WITH DIFFERENTIAL/PLATELET
Abs Immature Granulocytes: 0.02 10*3/uL (ref 0.00–0.07)
Basophils Absolute: 0 10*3/uL (ref 0.0–0.1)
Basophils Relative: 1 %
Eosinophils Absolute: 0.1 10*3/uL (ref 0.0–0.5)
Eosinophils Relative: 2 %
HCT: 49 % (ref 39.0–52.0)
Hemoglobin: 15.5 g/dL (ref 13.0–17.0)
Immature Granulocytes: 0 %
Lymphocytes Relative: 26 %
Lymphs Abs: 1.7 10*3/uL (ref 0.7–4.0)
MCH: 27.3 pg (ref 26.0–34.0)
MCHC: 31.6 g/dL (ref 30.0–36.0)
MCV: 86.3 fL (ref 80.0–100.0)
Monocytes Absolute: 0.5 10*3/uL (ref 0.1–1.0)
Monocytes Relative: 8 %
Neutro Abs: 4.3 10*3/uL (ref 1.7–7.7)
Neutrophils Relative %: 63 %
Platelets: 164 10*3/uL (ref 150–400)
RBC: 5.68 MIL/uL (ref 4.22–5.81)
RDW: 15.6 % — ABNORMAL HIGH (ref 11.5–15.5)
WBC: 6.7 10*3/uL (ref 4.0–10.5)
nRBC: 0 % (ref 0.0–0.2)

## 2024-03-29 LAB — BASIC METABOLIC PANEL WITH GFR
Anion gap: 6 (ref 5–15)
BUN: 22 mg/dL (ref 8–23)
CO2: 25 mmol/L (ref 22–32)
Calcium: 9.2 mg/dL (ref 8.9–10.3)
Chloride: 105 mmol/L (ref 98–111)
Creatinine, Ser: 1.03 mg/dL (ref 0.61–1.24)
GFR, Estimated: >60 mL/min (ref >60)
Glucose, Bld: 114 mg/dL — ABNORMAL HIGH (ref 70–99)
Potassium: 3.8 mmol/L (ref 3.5–5.1)
Sodium: 136 mmol/L (ref 135–145)

## 2024-03-29 LAB — RESP PANEL BY RT-PCR (RSV, FLU A&B, COVID)  RVPGX2
Influenza A by PCR: NEGATIVE
Influenza B by PCR: NEGATIVE
Resp Syncytial Virus by PCR: NEGATIVE
SARS Coronavirus 2 by RT PCR: NEGATIVE

## 2024-03-29 MED ORDER — KETOROLAC TROMETHAMINE 15 MG/ML IJ SOLN
15.0000 mg | Freq: Once | INTRAMUSCULAR | Status: AC
  Administered 2024-03-29: 15 mg via INTRAVENOUS
  Filled 2024-03-29: qty 1

## 2024-03-29 MED ORDER — ACETAMINOPHEN 500 MG PO TABS
1000.0000 mg | ORAL_TABLET | Freq: Once | ORAL | Status: AC
Start: 2024-03-29 — End: 2024-03-29
  Administered 2024-03-29: 1000 mg via ORAL
  Filled 2024-03-29: qty 2

## 2024-03-29 MED ORDER — LIDOCAINE 5 % EX PTCH: 1.0000 | MEDICATED_PATCH | CUTANEOUS | 0 refills | Status: DC

## 2024-03-29 MED ORDER — LIDOCAINE 5 % EX PTCH
1.0000 | MEDICATED_PATCH | CUTANEOUS | 0 refills | Status: AC
Start: 2024-03-29 — End: 2024-04-08

## 2024-03-29 MED ORDER — LIDOCAINE 5 % EX PTCH
1.0000 | MEDICATED_PATCH | CUTANEOUS | Status: DC
  Administered 2024-03-29: 1 via TRANSDERMAL
  Filled 2024-03-29: qty 1

## 2024-03-29 MED ORDER — OXYCODONE HCL 5 MG PO TABS
10.0000 mg | ORAL_TABLET | Freq: Once | ORAL | Status: AC
  Administered 2024-03-29: 10 mg via ORAL
  Filled 2024-03-29: qty 2

## 2024-03-29 NOTE — Discharge Instructions (Addendum)
 Fortunately your evaluation in the emergency department did not show any life-threatening conditions from your fall or from your cough.  There was no sign of bacterial pneumonia so we will hold off on antibiotics for the time being.  Follow-up with your doctor.  Take acetaminophen  650 mg every 6 hours for pain.  Take with food. Use Lidoderm  patches as prescribed. Continue taking your home pain medications as prescribed as well.  Thank you for choosing us  for your health care today!  Please see your primary doctor this week for a follow up appointment.   If you have any new, worsening, or unexpected symptoms call your doctor right away or come back to the emergency department for reevaluation.  It was my pleasure to care for you today.   Arron Large Margery Sheets, MD

## 2024-03-29 NOTE — ED Provider Notes (Signed)
 [facility] Provider Note    Event Date/Time   First MD Initiated Contact with Patient 03/28/24 2358     (approximate)   History   Fall   HPI  Lawrence [name] is a 68 y.o. male   Past medical history of CAD, COPD, celiac's disease, paroxysmal supraventricular tachycardia, atrial fibrillation on Eliquis , degenerative disc disease lumbar who presents to the emergency department with a fall.  He was standing to urinate and upon turning he felt lightheaded lost his balance struck his leg against the commode and fell backward striking his head and lower back.  He did not lose consciousness.  He has been back up on his feet since his injury.  On review of systems he notes that he has had a increase in his cough over the last several days with some yellow sputum production.  No fevers no chills no shortness of breath.  Denies GI or GU symptoms.  His chief complaint now is lower back pain midline where he struck the ground earlier today.   External Medical Documents Reviewed: Family practice note from April 2025 documented past medical history medications      Physical Exam   Triage Vital Signs: ED Triage Vitals  Encounter Vitals Group     BP 03/28/24 2343 (!) 147/105     Systolic BP Percentile --      Diastolic BP Percentile --      Pulse Rate 03/28/24 2343 78     Resp 03/28/24 2343 18     Temp 03/28/24 2343 97.7 F (36.5 C)     Temp Source 03/28/24 2343 Oral     SpO2 03/28/24 2343 98 %     Weight --      Height --      Head Circumference --      Peak Flow --      Pain Score 03/29/24 0044 9     Pain Loc --      Pain Education --      Exclude from Growth Chart --     Most recent vital signs: Vitals:   03/28/24 2343  BP: (!) 147/105  Pulse: 78  Resp: 18  Temp: 97.7 F (36.5 C)  SpO2: 98%    General: Awake, no distress.  CV:  Good peripheral perfusion.  Resp:  Normal effort.  Abd:  No distention.  Other:  Slightly hypertensive  otherwise vital signs normal, well-appearing patient laying in stretcher no acute distress pleasant and answering my questions appropriately.  No obvious signs of head trauma or neck trauma and his neck is supple with full range of motion.  Palpation of the chest wall abdomen elicits no pain there is no external signs of injury there.  Visual examination of the back shows no contusions or external signs of injury but midline tenderness upon palpation of the lumbar spine.  He is able to stand and walk with a walker.  He is able to range upper extremities with full active range of motion in all joints.  Palpation of the bony areas of the upper and lower extremities elicits no pain.  Auscultation of the lungs reveals no wheezing or focalities.  He is breathing comfortably on room air.  He has no facial asymmetry or dysarthria and has a normal motor and sensory examination in his ambulatory with steady gait using a walker, feels at baseline when walking.  He does note transient lightheadedness immediately upon standing that resolves with sitting position.  ED Results / Procedures / Treatments   Labs (all labs ordered are listed, but only abnormal results are displayed) Labs Reviewed  CBC WITH DIFFERENTIAL/PLATELET - Abnormal; Notable for the following components:      Result Value   RDW 15.6 (*)    All other components within normal limits  RESP PANEL BY RT-PCR (RSV, FLU A&B, COVID)  [id number]  BASIC METABOLIC PANEL WITH GFR     I ordered and reviewed the above labs they are notable for white blood cell count is normal.    RADIOLOGY I independently reviewed and interpreted chest x-ray and I see no obvious focality pneumothorax I also reviewed radiologist's formal read.   PROCEDURES:  Critical Care performed: No  Procedures   MEDICATIONS ORDERED IN ED: Medications  lidocaine  (LIDODERM ) 5 % 1 patch (1 patch Transdermal Patch Applied 03/29/24 0045)  oxyCODONE  (Oxy IR/ROXICODONE )  immediate release tablet 10 mg (10 mg Oral Given 03/29/24 0045)  acetaminophen  (TYLENOL ) tablet 1,000 mg (1,000 mg Oral Given 03/29/24 0044)  ketorolac  (TORADOL ) 15 MG/ML injection 15 mg (15 mg Intravenous Given 03/29/24 0046)     IMPRESSION / MDM / ASSESSMENT AND PLAN / ED COURSE  I reviewed the triage vital signs and the nursing notes.                                Patient's presentation is most consistent with acute presentation with potential threat to life or bodily function.  Differential diagnosis includes, but is not limited to, traumatic injury including head fracture, head bleed, C-spine injury, lumbar fracture dislocation, contusion.  Orthostatic lightheadedness in the setting of dehydration or infection.  Considered but less likely stroke   The patient is on the cardiac monitor to evaluate for evidence of arrhythmia and/or significant heart rate changes.  MDM:    He had a fall with an injury to the back of his head and his lower back.  Get head imaging given his Eliquis  use, which fortunately looks normal.  C-spine injury considered as well, fortunately normal imaging and exam.  Lower back pain with midline tenderness so got a stray which fortunately shows no fracture.  Otherwise traumatic survey reveals no other areas of injury and he is ambulatory with steady gait on his walker so I think in terms of traumatic injuries, no life-threatening injuries identified.  He has been having some orthostatic lightheadedness with no other neurologic deficits so I doubt stroke.  I think this may be in the setting of his respiratory infection as he has had acute bronchitis with productive sputum over the last several days.  There is no wheezing on auscultation no increased oxygen  requirement, no fever, no leukocytosis.  Barring any focal opacities on chest x-ray, I will defer antibiotics.  Ultimately the patient looks well with overall reassuring workup as above, and thus I think he will be  discharged with outpatient follow-up.       FINAL CLINICAL IMPRESSION(S) / ED DIAGNOSES   Final diagnoses:  Fall, initial encounter  Orthostatic lightheadedness  Acute cough     Rx / DC Orders   ED Discharge Orders          Ordered    lidocaine  (LIDODERM ) 5 %  Every 24 hours        03/29/24 0108             Note:  This document was prepared using Dragon voice recognition software and  may include unintentional dictation errors.    Buell Carmin, MD 03/29/24 [phone]

## 2024-03-29 NOTE — ED Notes (Signed)
 Pt moved to hallway to await cab/bus in the morning

## 2024-03-30 ENCOUNTER — Telehealth: Payer: Self-pay

## 2024-03-30 NOTE — Transitions of Care (Post Inpatient/ED Visit) (Unsigned)
   03/30/2024  Name: Lawrence [name] MRN: [id number] DOB: 1956-06-10  Today's TOC FU Call Status: Today's TOC FU Call Status:: Unsuccessful Call (1st Attempt) Unsuccessful Call (1st Attempt) Date: 03/30/24  Attempted to reach the patient regarding the most recent Inpatient/ED visit.  Follow Up Plan: Additional outreach attempts will be made to reach the patient to complete the Transitions of Care (Post Inpatient/ED visit) call.   Signature Darrall Ellison, LPN [facility] Nurse Health Advisor Direct Dial [phone]

## 2024-03-31 ENCOUNTER — Encounter: Payer: Self-pay | Admitting: Family Medicine

## 2024-03-31 ENCOUNTER — Ambulatory Visit (INDEPENDENT_AMBULATORY_CARE_PROVIDER_SITE_OTHER): Admitting: Family Medicine

## 2024-03-31 VITALS — BP 103/67 | HR 93 | Temp 97.6°F | Resp 20 | Wt 170.0 lb

## 2024-03-31 DIAGNOSIS — J449 Chronic obstructive pulmonary disease, unspecified: Secondary | ICD-10-CM | POA: Diagnosis not present

## 2024-03-31 DIAGNOSIS — I5042 Chronic combined systolic (congestive) and diastolic (congestive) heart failure: Secondary | ICD-10-CM

## 2024-03-31 DIAGNOSIS — G4700 Insomnia, unspecified: Secondary | ICD-10-CM | POA: Diagnosis not present

## 2024-03-31 DIAGNOSIS — I5043 Acute on chronic combined systolic (congestive) and diastolic (congestive) heart failure: Secondary | ICD-10-CM

## 2024-03-31 DIAGNOSIS — G4733 Obstructive sleep apnea (adult) (pediatric): Secondary | ICD-10-CM

## 2024-03-31 DIAGNOSIS — R296 Repeated falls: Secondary | ICD-10-CM

## 2024-03-31 MED ORDER — TRAZODONE HCL 150 MG PO TABS: 150.0000 mg | ORAL_TABLET | Freq: Every evening | ORAL | 3 refills | Status: DC | PRN

## 2024-03-31 MED ORDER — CETIRIZINE HCL 10 MG PO TABS: 10.0000 mg | ORAL_TABLET | Freq: Every day | ORAL | 1 refills | Status: DC

## 2024-03-31 NOTE — Transitions of Care (Post Inpatient/ED Visit) (Signed)
   03/31/2024  Name: Lawrence [name] MRN: [id number] DOB: 1956-02-28  Today's TOC FU Call Status: Today's TOC FU Call Status:: Unsuccessful Call (1st Attempt) Unsuccessful Call (1st Attempt) Date: 03/30/24  Attempted to reach the patient regarding the most recent Inpatient/ED visit.  Follow Up Plan: No further outreach attempts will be made at this time. We have been unable to contact the patient. Patient already seen in office Signature Darrall Ellison, LPN [facility] Nurse Health Advisor Direct Dial [phone]

## 2024-03-31 NOTE — Patient Instructions (Addendum)
 Please review the attached list of medications and notify my office if there are any errors.   Please call [facility] at   [phone]   to arrange a home sleep study so we can get you back on a CPAP machine

## 2024-04-07 ENCOUNTER — Other Ambulatory Visit: Payer: Self-pay

## 2024-04-07 NOTE — Patient Instructions (Signed)
 Visit Information  Thank you for taking time to visit with me today. Please don't hesitate to contact me if I can be of assistance to you before our next scheduled appointment.  Your next care management appointment is by telephone on 04/21/24 at 12:30  Please call the care guide team at [phone] if you need to cancel, schedule, or reschedule an appointment.   Please call the Suicide and Crisis Lifeline: 988 call the [place]  National Suicide Prevention Lifeline: 617-133-2694 or TTY: 905-528-2229 TTY 4124995375) to talk to a trained counselor call 1-800-273-TALK (toll free, 24 hour hotline) call 911 if you are experiencing a Mental Health or Behavioral Health Crisis or need someone to talk to.  Valora Gear, Florestine Hurl, MHA [place]  [facility] Social Worker, Population Health [phone]

## 2024-04-07 NOTE — Patient Outreach (Signed)
 Complex Care Management   Visit Note  04/07/2024  Name:  Lawrence [name] MRN: [id number] DOB: 08/10/1956  Situation: Referral received for Complex Care Management related to SDOH Barriers:  Transportation Food insecurity I obtained verbal consent from Patient.  Visit completed with patient  on the phone  Background:   Past Medical History:  Diagnosis Date   A-fib (HCC)    Anemia    Anginal pain (HCC)    Anxiety    Arthritis    Asthma    CAD (coronary artery disease)    a. 2002 CABGx2 (LIMA->LAD, VG->VG->OM1);  b. 09/2012 DES->OM;  c. 03/2015 PTCA of LAD [facility]) in setting of atretic LIMA; d. 05/2015 Cath [facility]): nonobs dzs; e. 06/2015 Cath ([facility]): LM nl, LAD 45p/d ISR, 50d, D1/2 small, LCX 50p/d ISR, OM1 70ost, 30 ISR, VG->OM1 50ost, 83m, LIMA->LAD 99p/d - atretic, RCA dom, nl; f.cath 10/16: 40-50%(FFR 0.90) pLAD, 75% (FFR 0.77) mLAD s/p PCI/DES, oRCA 40% (FFR0.95)   Cancer (HCC)    SKIN CANCER ON BACK   Celiac disease    Chronic diastolic CHF (congestive heart failure) (HCC)    a. 06/2009 Echo: EF 60-65%, Gr 1 DD, triv AI, mildly dil LA, nl RV.   COPD (chronic obstructive pulmonary disease) (HCC)    a. Chronic bronchitis and emphysema.   DDD (degenerative disc disease), lumbar    Diverticulosis    Dysrhythmia    Essential hypertension    GERD (gastroesophageal reflux disease)    History of hiatal hernia    History of kidney stones    H/O   History of tobacco abuse    a. Quit 2014.   Myocardial infarction [facility]) 2002   4 STENTS   Pancreatitis    PSVT (paroxysmal supraventricular tachycardia) (HCC)    a. 10/2012 Noted on Zio Patch.   Sleep apnea    LOST CORD TO CPAP -ONLY 02 @ BEDTIME   Tubular adenoma of colon    Type II diabetes mellitus (HCC)     Assessment: SW completed a telephone outreach with patient, he has not applied for foodstamps yet, but will get his grandson to assist with applying online. Patient does have transportation for medical appointments using  Medicaid.  SDOH Interventions    Flowsheet Row Telephone from 03/28/2024 in [place] POPULATION HEALTH DEPARTMENT Patient Outreach Telephone from 03/22/2024 in [place] POPULATION HEALTH DEPARTMENT Patient Outreach Telephone from 03/21/2024 in [place] POPULATION HEALTH DEPARTMENT Clinical Support from 03/14/2024 in [facility] Patient Outreach from 02/28/2024 in [place] POPULATION HEALTH DEPARTMENT Patient Outreach from 01/13/2024 in  POPULATION HEALTH DEPARTMENT  SDOH Interventions        Food Insecurity Interventions Other (Comment)  [Patient is receiving Meals on Wheels and is awaiting SNAP benefit approval.Verified patient's home address to mail food pantry list.] -- -- Intervention Not Indicated Other (Comment)  [Reports currently receiving Meals on Wheels and pending submission of application for SNAP benefits] Other (Comment)  [Reports discussion options with SW. Declines current need for additional resources.]  Housing Interventions -- -- -- Intervention Not Indicated Intervention Not Indicated Intervention Not Indicated  Transportation Interventions Other (Comment), Payor Benefit  [Patient has Medicaid transportation for medical apppointments. Gave number for [employer] transportation.] -- -- Intervention Not Indicated, Patient Resources (Friends/Family) Other (Comment)  [Reports receiving transportation assistance] Intervention Not Indicated  Utilities Interventions -- -- -- Intervention Not Indicated Intervention Not Indicated Intervention Not Indicated  Alcohol Usage Interventions -- -- -- Intervention Not Indicated (Score <  7) Intervention Not Indicated (Score <7) Intervention Not Indicated (Score <7)  Depression Interventions/Treatment  -- Counseling, Currently on Treatment Counseling, Currently on Treatment -- -- --  Financial Strain Interventions -- -- -- Intervention Not Indicated Other (Comment)  [Declined current need for additional financial  resources] Intervention Not Indicated  Physical Activity Interventions -- -- -- Intervention Not Indicated Other (Comments)  [Limited Mobility and exercise tolerance due to advanced heart disease] Other (Comments)  [Ability to exercise is limited due to significant mobility limitations and high fall risk. He has completed Physical Therapy]  Stress Interventions -- -- -- Intervention Not Indicated -- Other (Comment)  [Reports due to frequent episodes/Emergency evaluations due to chronic chest pain]  Social Connections Interventions -- -- -- Intervention Not Indicated Intervention Not Indicated, Other (Comment)  [Patient declines current need for community/social resources] Intervention Not Indicated  [Declined current need for community resources. Agreeable to transition to in-home provider due to limited mobility]  Health Literacy Interventions -- -- -- Intervention Not Indicated -- Intervention Not Indicated       Recommendation:   No recommendations at this time.   Follow Up Plan:   SW will follow up on 04/21/24  Valora Gear, BSW, MHA [place]  [facility] Social Worker, Population Health [phone]

## 2024-04-10 MED ORDER — FAMOTIDINE 20 MG PO TABS: 20.0000 mg | ORAL_TABLET | Freq: Two times a day (BID) | ORAL | 2 refills | Status: DC

## 2024-04-11 ENCOUNTER — Telehealth: Payer: Self-pay

## 2024-04-11 NOTE — Telephone Encounter (Signed)
 Copied from CRM [id number]. Topic: Clinical - Order For Equipment >> Apr 11, 2024  1:22 PM Alysia Jumbo S wrote: Reason for CRM: Patient states that he discussed an order for a hospital bed with provider on 05/16, but has not received any additional information. Patient request a callback at [phone]

## 2024-04-12 ENCOUNTER — Other Ambulatory Visit: Payer: Self-pay | Admitting: Family Medicine

## 2024-04-12 NOTE — Telephone Encounter (Signed)
 Pt is requesting a call back concerning hospital bed

## 2024-04-12 NOTE — Progress Notes (Signed)
 Established patient visit   Patient: Lawrence [name]   DOB: Jul 06, 1956   68 y.o. Male  MRN: [id number] Visit Date: 03/31/2024  Today's healthcare provider: Jeralene Mom, MD   Chief Complaint  Patient presents with   DME for hospital med   Subjective    HPI   Patient with extensive medical history presents reports frequently falling out of his standard bed resulting in injuries. He has also had several falls He has advanced coronary artery disease, systolic and diastolic heart failure and has to prop himself up on at least 3 pillow at night due to dyspnea. He also has chronic LE edema requiring elevating legs to level of his heart. He has also had falls transferring from bed to wheelchair due to height differential. Additionally he has severe sleep apnea requiring oxygen  at night. He is in need of a new CPAP as his current one one is well over 50 years old.   Medications: Outpatient Medications Prior to Visit  Medication Sig   Accu-Chek Softclix Lancets lancets Use as instructed to check sugar daily for type 2 diabetes.   ALPRAZolam  (XANAX ) 1 MG tablet TAKE 1 TABLET BY MOUTH 3 TIMES A DAY AS NEEDED   aluminum -magnesium  hydroxide-simethicone  (MAALOX) 200-200-20 MG/5ML SUSP Take 30 mLs by mouth 4 (four) times daily -  before meals and at bedtime.   amLODipine  (NORVASC ) 10 MG tablet Take 10 mg by mouth daily.   apixaban  (ELIQUIS ) 5 MG TABS tablet Take 1 tablet (5 mg total) by mouth 2 (two) times daily.   atorvastatin  (LIPITOR ) 80 MG tablet TAKE 1 TABLET BY MOUTH AT BEDTIME   Blood Glucose Calibration (ACCU-CHEK GUIDE CONTROL) LIQD Use with blood glucose monitor as directed   Blood Glucose Monitoring Suppl (ACCU-CHEK GUIDE) w/Device KIT Use to check blood sugars as directed   Budeson-Glycopyrrol-Formoterol  (BREZTRI  AEROSPHERE) 160-9-4.8 MCG/ACT AERO Inhale 2 puffs into the lungs 2 (two) times daily.   carvedilol  (COREG ) 3.125 MG tablet Take 3.125 mg by mouth 2 (two) times daily with a  meal.   carvedilol  (COREG ) 3.125 MG tablet TAKE 1 TABLET BY MOUTH TWICE A DAY   carvedilol  (COREG ) 3.125 MG tablet TAKE 1 TABLET BY MOUTH TWICE A DAY   Cyanocobalamin  (VITAMIN B-12) 1000 MCG SUBL Place 1 tablet under the tongue daily.   dapagliflozin  propanediol (FARXIGA ) 10 MG TABS tablet TAKE 1 TABLET BY MOUTH DAILY   gabapentin  (NEURONTIN ) 300 MG capsule Take 1 capsule at bedtime for 1 week, then increase to 1 twice a day   glucose blood (ACCU-CHEK GUIDE) test strip Use as instructed to check sugar daily for type 2 diabetes.   hydrOXYzine  (VISTARIL ) 25 MG capsule Take 1 capsule (25 mg total) by mouth 3 (three) times daily as needed.   iron  polysaccharides (FERREX 150) 150 MG capsule TAKE 1 CAPSULE BY MOUTH DAILY   isosorbide  mononitrate (IMDUR ) 120 MG 24 hr tablet Take 1 tablet (120 mg total) by mouth daily.   [EXPIRED] lidocaine  (LIDODERM ) 5 % Place 1 patch onto the skin daily for 10 days. Remove & Discard patch within 12 hours or as directed by MD   lithium carbonate 150 MG capsule Take by mouth.   metFORMIN  (GLUCOPHAGE -XR) 500 MG 24 hr tablet TAKE 1 TABLET BY MOUTH DAILY WITH EVENING MEAL   methocarbamol  (ROBAXIN ) 500 MG tablet TAKE 1 TABLET BY MOUTH EVERY 8 HOURS AS NEEDED FOR MUSCLE SPASMS   metoprolol  succinate (TOPROL -XL) 25 MG 24 hr tablet Take 12.5 mg by  mouth daily.   nitroGLYCERIN  (NITROSTAT ) 0.4 MG SL tablet Place 1 tablet (0.4 mg total) under the tongue every 5 (five) minutes as needed for chest pain (Do not take if blood pressure is low, systolic BP<110).   omega-3 acid ethyl esters (LOVAZA ) 1 g capsule TAKE 4 CAPSULES (4 GRAMS TOTAL) BY MOUTHDAILY   oxyCODONE -acetaminophen  (PERCOCET) 10-325 MG tablet TAKE 1 TABLET BY MOUTH EVERY 4 HOURS AS NEEDED FOR PAIN   OXYGEN  Inhale 2 L into the lungs at bedtime as needed (for shortness of breath).   pantoprazole  (PROTONIX ) 40 MG tablet TAKE 1 TABLET BY MOUTH 2 TIMES DAILY (30MINUTES BEFORE A MEAL)   ranolazine  (RANEXA ) 1000 MG SR tablet  Take 1 tablet (1,000 mg total) by mouth 2 (two) times daily.   sacubitril -valsartan  (ENTRESTO ) 24-26 MG Take 1 tablet by mouth 2 (two) times daily.   sennosides-docusate sodium  (SENOKOT-S) 8.6-50 MG tablet Take 1 tablet by mouth daily.   spironolactone  (ALDACTONE ) 25 MG tablet Take 1 tablet (25 mg total) by mouth daily.   sucralfate  (CARAFATE ) 1 GM/10ML suspension Take 10 mLs (1 g total) by mouth 4 (four) times daily.   venlafaxine  (EFFEXOR ) 25 MG tablet Take 25 mg by mouth daily.   allopurinol  (ZYLOPRIM ) 300 MG tablet Take 300 mg by mouth 2 (two) times daily.   cetirizine  (ZYRTEC ) 10 MG tablet TAKE 1 TABLET BY MOUTH AT BEDTIME   traZODone  (DESYREL ) 100 MG tablet Take 1 tablet (100 mg total) by mouth at bedtime as needed for sleep.   metoCLOPramide  (REGLAN ) 10 MG tablet Take 1 tablet (10 mg total) by mouth 4 (four) times daily -  before meals and at bedtime for 15 days.   famotidine  (PEPCID ) 20 MG tablet Take 1 tablet (20 mg total) by mouth 2 (two) times daily for 15 days.   No facility-administered medications prior to visit.    Review of Systems     Objective    BP 103/67 (BP Location: Left Arm, Patient Position: Sitting, Cuff Size: Normal)   Pulse 93   Temp 97.6 F (36.4 C) (Oral)   Resp 20   Wt 170 lb (77.1 kg)   SpO2 99%   BMI 26.63 kg/m    Physical Exam   General: Appearance:    Well developed, well nourished male in no acute distress  Eyes:    PERRL, conjunctiva/corneas clear, EOM's intact       Lungs:     Clear to auscultation bilaterally, respirations unlabored  Heart:    Normal heart rate. Irregularly irregular rhythm. No murmurs, rubs, or gallops.    MS:   All extremities are intact.    Neurologic:   Awake, alert, oriented x 3. No apparent focal neurological defect.         Assessment & Plan     1. OSA (obstructive sleep apnea) (Primary)   2. Chronic obstructive pulmonary disease, unspecified COPD type (HCC)  - Ambulatory referral to Sleep Studies  3.  Insomnia, unspecified type  - traZODone  (DESYREL ) 150 MG tablet; Take 1 tablet (150 mg total) by mouth at bedtime as needed for sleep.  Dispense: 30 tablet; Refill: 3  4. Acute on chronic combined systolic and diastolic congestive heart failure (HCC)   5. Chronic combined systolic (congestive) and diastolic (congestive) heart failure (HCC)   6. Generalized weakness  Multiple falls from falling out of bed and transferring to chair requiring bed rails and electric bed to raise or lower to height of wheelchair for safe transfers. Requires  ability to elevate head above bed 30-45 degrees to help control CHF and OSA symptoms. Order for Electric Bed faxed to DME provider.         Jeralene Mom, MD  [facility] [phone] (phone) [phone] (fax)  [facility]

## 2024-04-13 NOTE — Telephone Encounter (Signed)
 Notes and orders signed off earlier this week and sent to Medical Records to be faxed. He should get a call from [facility] within a few days.

## 2024-04-13 NOTE — Telephone Encounter (Signed)
Spoke with patient and made aware. Patient verbalized understanding.

## 2024-04-17 ENCOUNTER — Ambulatory Visit: Payer: Self-pay

## 2024-04-17 NOTE — Telephone Encounter (Signed)
 Copied from CRM [id number]. Topic: Clinical - Red Word Triage >> Apr 17, 2024  9:58 AM Phil Braun wrote: Red Word that prompted transfer to Nurse Triage:   Pt called in and is requesting a fluid pill. He stated that he use to take one and needs to go back on it. Pt is having swelling in legs and is painful.   Chief Complaint: Leg swelling  Symptoms: Bilateral leg swelling, leg pain, some increased shortness of breath  Frequency: Constant  Pertinent Negatives: Patient denies chest pain Disposition: [] ED /[] Urgent Care (no appt availability in office) / [x] Appointment(In office/virtual)/ []  [facility]/ [] Home Care/ [x] Refused Recommended Disposition /[] [facility]/ []  Follow-up with PCP Additional Notes: Patient reports he has had bilateral leg swelling for the last 2 days. He states his swelling is painful as well rating his pain as 7/10. He states he is also experiencing mild increased shortness of breath. He denies any chest pain.   Patient states he would like a prescription for Lasix , which he states Dr. Shann Darnel has written in the past without an appointment. Please contact patient with a response to his request when able.    Reason for Disposition  [1] MODERATE leg swelling (e.g., swelling extends up to knees) AND [2] new-onset or worsening  Answer Assessment - Initial Assessment Questions 1. ONSET: "When did the swelling start?" (e.g., minutes, hours, days)     A couple of days  2. LOCATION: "What part of the leg is swollen?"  "Are both legs swollen or just one leg?"     Bilateral legs  3. SEVERITY: "How bad is the swelling?" (e.g., localized; mild, moderate, severe)   - Localized: Small area of swelling localized to one leg.   - MILD pedal edema: Swelling limited to foot and ankle, pitting edema < 1/4 inch (6 mm) deep, rest and elevation eliminate most or all swelling.   - MODERATE edema: Swelling of lower leg to knee, pitting edema > 1/4 inch (6 mm) deep, rest and  elevation only partially reduce swelling.   - SEVERE edema: Swelling extends above knee, facial or hand swelling present.      Moderate  4. REDNESS: "Does the swelling look red or infected?"     No 5. PAIN: "Is the swelling painful to touch?" If Yes, ask: "How painful is it?"   (Scale 1-10; mild, moderate or severe)     7/10 6. FEVER: "Do you have a fever?" If Yes, ask: "What is it, how was it measured, and when did it start?"      No 7. CAUSE: "What do you think is causing the leg swelling?"     Chronic problem  8. MEDICAL HISTORY: "Do you have a history of blood clots (e.g., DVT), cancer, heart failure, kidney disease, or liver failure?"     History of CHF 9. RECURRENT SYMPTOM: "Have you had leg swelling before?" If Yes, ask: "When was the last time?" "What happened that time?"     Yes, used to take Lasix   10. OTHER SYMPTOMS: "Do you have any other symptoms?" (e.g., chest pain, difficulty breathing)       Some increased shortness of breath  Protocols used: Leg Swelling and Edema-A-AH

## 2024-04-18 ENCOUNTER — Other Ambulatory Visit: Payer: Self-pay | Admitting: Family Medicine

## 2024-04-18 DIAGNOSIS — R600 Localized edema: Secondary | ICD-10-CM

## 2024-04-18 MED ORDER — FUROSEMIDE 20 MG PO TABS: 20.0000 mg | ORAL_TABLET | Freq: Every day | ORAL | 2 refills | Status: DC | PRN

## 2024-04-18 NOTE — Telephone Encounter (Signed)
 [facility]

## 2024-04-19 ENCOUNTER — Other Ambulatory Visit: Payer: Self-pay

## 2024-04-19 ENCOUNTER — Emergency Department
Admission: EM | Admit: 2024-04-19 | Discharge: 2024-04-19 | Disposition: A | Discharge status: Home / Self Care | Attending: Emergency Medicine | Admitting: Emergency Medicine

## 2024-04-19 DIAGNOSIS — T50901A Poisoning by unspecified drugs, medicaments and biological substances, accidental (unintentional), initial encounter: Secondary | ICD-10-CM

## 2024-04-19 DIAGNOSIS — E119 Type 2 diabetes mellitus without complications: Secondary | ICD-10-CM | POA: Insufficient documentation

## 2024-04-19 DIAGNOSIS — Z955 Presence of coronary angioplasty implant and graft: Secondary | ICD-10-CM | POA: Insufficient documentation

## 2024-04-19 DIAGNOSIS — I110 Hypertensive heart disease with heart failure: Secondary | ICD-10-CM | POA: Insufficient documentation

## 2024-04-19 DIAGNOSIS — T43691A Poisoning by other psychostimulants, accidental (unintentional), initial encounter: Secondary | ICD-10-CM | POA: Insufficient documentation

## 2024-04-19 DIAGNOSIS — I509 Heart failure, unspecified: Secondary | ICD-10-CM | POA: Diagnosis not present

## 2024-04-19 DIAGNOSIS — R42 Dizziness and giddiness: Secondary | ICD-10-CM | POA: Insufficient documentation

## 2024-04-19 DIAGNOSIS — Z951 Presence of aortocoronary bypass graft: Secondary | ICD-10-CM | POA: Diagnosis not present

## 2024-04-19 DIAGNOSIS — I2510 Atherosclerotic heart disease of native coronary artery without angina pectoris: Secondary | ICD-10-CM | POA: Insufficient documentation

## 2024-04-19 DIAGNOSIS — E860 Dehydration: Secondary | ICD-10-CM | POA: Insufficient documentation

## 2024-04-19 DIAGNOSIS — R519 Headache, unspecified: Secondary | ICD-10-CM | POA: Diagnosis not present

## 2024-04-19 DIAGNOSIS — J4489 Other specified chronic obstructive pulmonary disease: Secondary | ICD-10-CM | POA: Insufficient documentation

## 2024-04-19 DIAGNOSIS — Z85828 Personal history of other malignant neoplasm of skin: Secondary | ICD-10-CM | POA: Insufficient documentation

## 2024-04-19 DIAGNOSIS — Z720 Tobacco use: Secondary | ICD-10-CM | POA: Diagnosis not present

## 2024-04-19 LAB — CBC WITH DIFFERENTIAL/PLATELET
Abs Immature Granulocytes: 0.04 10*3/uL (ref 0.00–0.07)
Basophils Absolute: 0 10*3/uL (ref 0.0–0.1)
Basophils Relative: 1 %
Eosinophils Absolute: 0.2 10*3/uL (ref 0.0–0.5)
Eosinophils Relative: 2 %
HCT: 48.2 % (ref 39.0–52.0)
Hemoglobin: 15.3 g/dL (ref 13.0–17.0)
Immature Granulocytes: 1 %
Lymphocytes Relative: 28 %
Lymphs Abs: 1.7 10*3/uL (ref 0.7–4.0)
MCH: 27.9 pg (ref 26.0–34.0)
MCHC: 31.7 g/dL (ref 30.0–36.0)
MCV: 88 fL (ref 80.0–100.0)
Monocytes Absolute: 0.6 10*3/uL (ref 0.1–1.0)
Monocytes Relative: 9 %
Neutro Abs: 3.7 10*3/uL (ref 1.7–7.7)
Neutrophils Relative %: 59 %
Platelets: 148 10*3/uL — ABNORMAL LOW (ref 150–400)
RBC: 5.48 MIL/uL (ref 4.22–5.81)
RDW: 17.6 % — ABNORMAL HIGH (ref 11.5–15.5)
WBC: 6.3 10*3/uL (ref 4.0–10.5)
nRBC: 0 % (ref 0.0–0.2)

## 2024-04-19 LAB — COMPREHENSIVE METABOLIC PANEL WITH GFR
ALT: 27 U/L (ref 0–44)
AST: 24 U/L (ref 15–41)
Albumin: 3.6 g/dL (ref 3.5–5.0)
Alkaline Phosphatase: 74 U/L (ref 38–126)
Anion gap: 8 (ref 5–15)
BUN: 14 mg/dL (ref 8–23)
CO2: 27 mmol/L (ref 22–32)
Calcium: 9.3 mg/dL (ref 8.9–10.3)
Chloride: 105 mmol/L (ref 98–111)
Creatinine, Ser: 1.5 mg/dL — ABNORMAL HIGH (ref 0.61–1.24)
GFR, Estimated: 50 mL/min — ABNORMAL LOW (ref >60)
Glucose, Bld: 109 mg/dL — ABNORMAL HIGH (ref 70–99)
Potassium: 3.7 mmol/L (ref 3.5–5.1)
Sodium: 140 mmol/L (ref 135–145)
Total Bilirubin: 0.6 mg/dL (ref 0.0–1.2)
Total Protein: 6.5 g/dL (ref 6.5–8.1)

## 2024-04-19 MED ORDER — SODIUM CHLORIDE 0.9 % IV BOLUS
500.0000 mL | Freq: Once | INTRAVENOUS | Status: AC
  Administered 2024-04-19: 500 mL via INTRAVENOUS

## 2024-04-19 MED ORDER — NAPROXEN 500 MG PO TABS
500.0000 mg | ORAL_TABLET | Freq: Once | ORAL | Status: AC
  Administered 2024-04-19: 500 mg via ORAL
  Filled 2024-04-19: qty 1

## 2024-04-19 NOTE — ED Notes (Signed)
 Pt assisted into clean gown and brief.

## 2024-04-19 NOTE — ED Provider Notes (Signed)
 [facility] Provider Note    Event Date/Time   First MD Initiated Contact with Patient 04/19/24 2128     (approximate)   History   Chief Complaint: No chief complaint on file.   HPI  Lawrence [name] is a 68 y.o. male with a history of atrial fibrillation, CAD, GERD, hypertension, CHF who comes to the ED due to dizziness, fatigue since waking up this morning.  Patient notes he was in his usual state of health until last night around 7:00 PM when he accidentally took some of his grandsons medicine including 1 tablet of Adderall, 1 Lamictal, and 1 Abilify.  By bedtime he was starting to have some dizziness.  In the morning he continued to feel unwell, and reports eating okay today but drinking minimal fluids.  Additionally, he was just started on Lasix  and took his first dose of it today and has been urinating frequently.  Denies chest pain shortness of breath syncope.  Does also complain of generalized headache which is not uncommon for him.        Past Medical History:  Diagnosis Date   A-fib (HCC)    Anemia    Anginal pain (HCC)    Anxiety    Arthritis    Asthma    CAD (coronary artery disease)    a. 2002 CABGx2 (LIMA->LAD, VG->VG->OM1);  b. 09/2012 DES->OM;  c. 03/2015 PTCA of LAD [facility]) in setting of atretic LIMA; d. 05/2015 Cath [facility]): nonobs dzs; e. 06/2015 Cath ([facility]): LM nl, LAD 45p/d ISR, 50d, D1/2 small, LCX 50p/d ISR, OM1 70ost, 30 ISR, VG->OM1 50ost, 44m, LIMA->LAD 99p/d - atretic, RCA dom, nl; f.cath 10/16: 40-50%(FFR 0.90) pLAD, 75% (FFR 0.77) mLAD s/p PCI/DES, oRCA 40% (FFR0.95)   Cancer (HCC)    SKIN CANCER ON BACK   Celiac disease    Chronic diastolic CHF (congestive heart failure) (HCC)    a. 06/2009 Echo: EF 60-65%, Gr 1 DD, triv AI, mildly dil LA, nl RV.   COPD (chronic obstructive pulmonary disease) (HCC)    a. Chronic bronchitis and emphysema.   DDD (degenerative disc disease), lumbar    Diverticulosis    Dysrhythmia    Essential  hypertension    GERD (gastroesophageal reflux disease)    History of hiatal hernia    History of kidney stones    H/O   History of tobacco abuse    a. Quit 2014.   Myocardial infarction [facility]) 2002   4 STENTS   Pancreatitis    PSVT (paroxysmal supraventricular tachycardia) (HCC)    a. 10/2012 Noted on Zio Patch.   Sleep apnea    LOST CORD TO CPAP -ONLY 02 @ BEDTIME   Tubular adenoma of colon    Type II diabetes mellitus [facility])     Current Outpatient Rx   Order #: [id number] Class: Normal   Order #: [id number] Class: Normal   Order #: [id number] Class: Normal   Order #: [id number] Class: Normal   Order #: [id number] Class: Historical Med   Order #: [id number] Class: Normal   Order #: [id number] Class: Normal   Order #: [id number] Class: Normal   Order #: [id number] Class: Normal   Order #: [id number] Class: Normal   Order #: [id number] Class: Normal   Order #: [id number] Class: Normal   Order #: [id number] Class: Historical Med   Order #: [id number] Class: Normal   Order #: [id number] Class: Normal   Order #: [id number] Class: Normal   Order #: [id number] Class: Normal   Order #: [id number] Class: Normal   Order #: [id number] Class: Normal  Order #: [id number] Class: Normal   Order #: [id number] Class: Normal   Order #: [id number] Class: Historical Med   Order #: [id number] Class: Normal   Order #: [id number] Class: Normal   Order #: [id number] Class: Normal   Order #: [id number] Class: Historical Med   Order #: [id number] Class: No Print   Order #: [id number] Class: Normal   Order #: [id number] Class: Normal   Order #: [id number] Class: Historical Med   Order #: [id number] Class: Normal   Order #: [id number] Class: Normal   Order #: [id number] Class: Historical Med   Order #: [id number] Class: Historical Med   Order #: [id number] Class: Normal   Order #: [id number] Class: Normal   Order #: [id number] Class: Normal   Order #: [id number] Class: Historical Med    Past Surgical History:  Procedure Laterality Date   BYPASS GRAFT      CARDIAC CATHETERIZATION N/A 07/12/2015   rocedure: Left Heart Cath and Cors/Grafts Angiography;  Surgeon: Arty Binning, MD;  Location: [facility] INVASIVE CV LAB;  Service: Cardiovascular;  Laterality: N/A;   CARDIAC CATHETERIZATION Right 10/07/2015   Procedure: Left Heart Cath and Cors/Grafts Angiography;  Surgeon: Cherrie Cornwall, MD;  Location: [facility] INVASIVE CV LAB;  Service: Cardiovascular;  Laterality: Right;   CARDIAC CATHETERIZATION N/A 04/06/2016   Procedure: Left Heart Cath and Coronary Angiography;  Surgeon: Antonette Batters, MD;  Location: [facility] INVASIVE CV LAB;  Service: Cardiovascular;  Laterality: N/A;   CARDIAC CATHETERIZATION  04/06/2016   Procedure: Bypass Graft Angiography;  Surgeon: Antonette Batters, MD;  Location: [facility] INVASIVE CV LAB;  Service: Cardiovascular;;   CARDIAC CATHETERIZATION N/A 11/02/2016   Procedure: Left Heart Cath and Cors/Grafts Angiography and possible PCI;  Surgeon: Antonette Batters, MD;  Location: [facility] INVASIVE CV LAB;  Service: Cardiovascular;  Laterality: N/A;   CARDIAC CATHETERIZATION N/A 11/02/2016   Procedure: Coronary Stent Intervention;  Surgeon: Antonette Batters, MD;  Location: [facility] INVASIVE CV LAB;  Service: Cardiovascular;  Laterality: N/A;   CARDIOVERSION N/A 06/29/2022   Procedure: CARDIOVERSION;  Surgeon: Michelle Aid, MD;  Location: [facility] ORS;  Service: Cardiovascular;  Laterality: N/A;   CHOLECYSTECTOMY     CIRCUMCISION N/A 06/09/2019   Procedure: CIRCUMCISION ADULT;  Surgeon: Lawerence Pressman, MD;  Location: [facility] ORS;  Service: Urology;  Laterality: N/A;   COLONOSCOPY WITH PROPOFOL  N/A 04/01/2018   Procedure: COLONOSCOPY WITH PROPOFOL ;  Surgeon: Cassie Click, MD;  Location: [facility] ENDOSCOPY;  Service: Endoscopy;  Laterality: N/A;   COLONOSCOPY WITH PROPOFOL  N/A 05/01/2023   Procedure: COLONOSCOPY WITH PROPOFOL ;  Surgeon: Selena Daily, MD;  Location: [facility] ENDOSCOPY;  Service: Gastroenterology;  Laterality: N/A;   ESOPHAGEAL DILATION      ESOPHAGOGASTRODUODENOSCOPY (EGD) WITH PROPOFOL  N/A 04/01/2018   Procedure: ESOPHAGOGASTRODUODENOSCOPY (EGD) WITH PROPOFOL ;  Surgeon: Cassie Click, MD;  Location: [facility] ENDOSCOPY;  Service: Endoscopy;  Laterality: N/A;   ESOPHAGOGASTRODUODENOSCOPY (EGD) WITH PROPOFOL  N/A 05/01/2023   Procedure: ESOPHAGOGASTRODUODENOSCOPY (EGD) WITH PROPOFOL ;  Surgeon: Selena Daily, MD;  Location: [facility] ENDOSCOPY;  Service: Gastroenterology;  Laterality: N/A;   GIVENS CAPSULE STUDY  05/01/2023   Procedure: GIVENS CAPSULE STUDY;  Surgeon: Selena Daily, MD;  Location: [facility] ENDOSCOPY;  Service: Gastroenterology;;   LEFT HEART CATH AND CORS/GRAFTS ANGIOGRAPHY N/A 06/12/2019   Procedure: LEFT HEART CATH AND CORS/GRAFTS ANGIOGRAPHY;  Surgeon: Ronney Cola, MD;  Location: [facility] INVASIVE CV LAB;  Service: Cardiovascular;  Laterality: N/A;   LEFT HEART CATH AND CORS/GRAFTS ANGIOGRAPHY N/A 03/11/2020   Procedure: LEFT HEART CATH AND CORS/GRAFTS ANGIOGRAPHY;  Surgeon: Percival Brace, MD;  Location:  [facility] INVASIVE CV LAB;  Service: Cardiovascular;  Laterality: N/A;   LEFT HEART CATH AND CORS/GRAFTS ANGIOGRAPHY N/A 05/01/2021   Procedure: LEFT HEART CATH AND CORS/GRAFTS ANGIOGRAPHY;  Surgeon: Michelle Aid, MD;  Location: [facility] INVASIVE CV LAB;  Service: Cardiovascular;  Laterality: N/A;   LEFT HEART CATH AND CORS/GRAFTS ANGIOGRAPHY N/A 11/02/2022   Procedure: LEFT HEART CATH AND CORS/GRAFTS ANGIOGRAPHY;  Surgeon: Antonette Batters, MD;  Location: [facility] INVASIVE CV LAB;  Service: Cardiovascular;  Laterality: N/A;   TONSILLECTOMY     VASCULAR SURGERY      Physical Exam   Triage Vital Signs: ED Triage Vitals  Encounter Vitals Group     BP      Systolic BP Percentile      Diastolic BP Percentile      Pulse      Resp      Temp      Temp src      SpO2      Weight      Height      Head Circumference      Peak Flow      Pain Score      Pain Loc      Pain Education      Exclude from Growth Chart      Most recent vital signs: Vitals:   04/19/24 2200 04/19/24 2230  BP:  101/75  Pulse: 99 91  Resp: 14 17  SpO2: 95% 95%    General: Awake, no distress.  CV:  Good peripheral perfusion.  Regular rate  Resp:  Normal effort.  Clear to auscultation bilaterally Abd:  No distention.  Soft nontender Other:  Cranial nerves III through XII intact.  Dry oral mucosa.  No rash   ED Results / Procedures / Treatments   Labs (all labs ordered are listed, but only abnormal results are displayed) Labs Reviewed  COMPREHENSIVE METABOLIC PANEL WITH GFR - Abnormal; Notable for the following components:      Result Value   Glucose, Bld 109 (*)    Creatinine, Ser 1.50 (*)    GFR, Estimated 50 (*)    All other components within normal limits  CBC WITH DIFFERENTIAL/PLATELET - Abnormal; Notable for the following components:   RDW 17.6 (*)    Platelets 148 (*)    All other components within normal limits     EKG Interpreted by me Atrial fibrillation, rate of 86.  Normal axis, normal intervals.  Poor R wave progression.  No acute ischemic changes.  1 PVC on the strip.   RADIOLOGY    PROCEDURES:  Procedures   MEDICATIONS ORDERED IN ED: Medications  sodium chloride  0.9 % bolus 500 mL (500 mLs Intravenous New Bag/Given 04/19/24 2209)  naproxen  (NAPROSYN ) tablet 500 mg (500 mg Oral Given 04/19/24 2239)     IMPRESSION / MDM / ASSESSMENT AND PLAN / ED COURSE  I reviewed the triage vital signs and the nursing notes.  DDx: Arrhythmia/cardiac toxicity, electrolyte derangement, dehydration, AKI  Patient's presentation is most consistent with acute presentation with potential threat to life or bodily function.  Patient presents with malaise, fatigue throughout the day after accidental ingestion of grandson's medication last night.  It has now been more than 24 hours since ingestion, doubt any ongoing toxic effects from low dose exposure.  I think his symptoms are due to dehydration from  inadequate water intake and then the addition of a diuretic today.  Clinically he appears dehydrated.  Vital signs are normal,  exam is otherwise benign.  Will give IV fluids for hydration while checking EKG and labs.   ----------------------------------------- 11:14 PM on 04/19/2024 ----------------------------------------- Continues to void spontaneously.  Tolerating oral intake.  Labs reassuring.  Stable for discharge and outpatient follow-up.  He will skip his Lasix  dose tomorrow to ensure adequate hydration before resuming diuretic.      FINAL CLINICAL IMPRESSION(S) / ED DIAGNOSES   Final diagnoses:  Accidental overdose, initial encounter  Dehydration     Rx / DC Orders   ED Discharge Orders     None        Note:  This document was prepared using Dragon voice recognition software and may include unintentional dictation errors.   Jacquie Maudlin, MD 04/19/24 2314

## 2024-04-19 NOTE — ED Triage Notes (Signed)
 Pt accidentally took grandsons medication which included Adderall x1, Lamictal 150 mg x1, and Abilify 15 mg. Pt complains of dizziness, general weakness, and abdominal pain.

## 2024-04-19 NOTE — Discharge Instructions (Addendum)
 Weight management is crucial for individuals with congestive heart failure (CHF) due to the risk of fluid retention and potential worsening of heart failure. Weight monitoring and management often involve daily weighing, adhering to a low-sodium diet, and maintaining an active lifestyle.  Weight Monitoring: Daily Weigh-ins: Individuals with CHF should weigh themselves daily, preferably at the same time each morning, after urinating, and before breakfast.  Consistency: Use the same scale, wear the same clothing (or ideally, minimal clothing), and track the weights consistently.  Reporting Changes: Notify your healthcare provider if you gain or lose a significant amount of weight, such as 2-3 pounds in one day or 5 or more pounds in a week.  Purpose: This allows you and your doctor to track potential fluid retention and adjust treatment if needed.  Diet and Lifestyle Modifications: Low-Sodium Diet: Reducing sodium intake helps to reduce fluid retention and manage blood pressure, which is important for heart failure.  Fluid Intake: Your healthcare provider can advise you on how much fluid to drink daily.  Regular Exercise: Engaging in light to moderate exercise, such as brisk walking, can help strengthen heart health and improve overall fitness.  Medication Adherence: Taking prescribed medications as directed is crucial for managing CHF.  Avoidance of High-Sodium Foods: Limit foods high in sodium, such as processed foods, canned goods, and fast food.  Additional Tips: Keep a Weight Chart: . Utilize a weight chart to track your weight changes over time.

## 2024-04-21 ENCOUNTER — Other Ambulatory Visit: Payer: Self-pay

## 2024-04-21 NOTE — Patient Instructions (Signed)
 Visit Information  Thank you for taking time to visit with me today. Please don't hesitate to contact me if I can be of assistance to you before our next scheduled appointment.  Your next care management appointment is by telephone on 05/05/24 at 12:30pm    Please call the care guide team at [phone] if you need to cancel, schedule, or reschedule an appointment.   Please call the Suicide and Crisis Lifeline: 988 call the [place]  National Suicide Prevention Lifeline: (817) 851-5776 or TTY: 5590786429 TTY 737-620-6971) to talk to a trained counselor call 1-800-273-TALK (toll free, 24 hour hotline) call 911 if you are experiencing a Mental Health or Behavioral Health Crisis or need someone to talk to.  Valora Gear, Florestine Hurl, MHA [place]  [facility] Social Worker, Population Health [phone]

## 2024-04-21 NOTE — Patient Outreach (Signed)
 Complex Care Management   Visit Note  04/21/2024  Name:  Lawrence [name] MRN: [id number] DOB: July 21, 1956  Situation: Referral received for Complex Care Management related to SDOH Barriers:  Transportation Food insecurity I obtained verbal consent from Patient.  Visit completed with patient  on the phone  Background:   Past Medical History:  Diagnosis Date   A-fib (HCC)    Anemia    Anginal pain (HCC)    Anxiety    Arthritis    Asthma    CAD (coronary artery disease)    a. 2002 CABGx2 (LIMA->LAD, VG->VG->OM1);  b. 09/2012 DES->OM;  c. 03/2015 PTCA of LAD [facility]) in setting of atretic LIMA; d. 05/2015 Cath [facility]): nonobs dzs; e. 06/2015 Cath ([facility]): LM nl, LAD 45p/d ISR, 50d, D1/2 small, LCX 50p/d ISR, OM1 70ost, 30 ISR, VG->OM1 50ost, 87m, LIMA->LAD 99p/d - atretic, RCA dom, nl; f.cath 10/16: 40-50%(FFR 0.90) pLAD, 75% (FFR 0.77) mLAD s/p PCI/DES, oRCA 40% (FFR0.95)   Cancer (HCC)    SKIN CANCER ON BACK   Celiac disease    Chronic diastolic CHF (congestive heart failure) (HCC)    a. 06/2009 Echo: EF 60-65%, Gr 1 DD, triv AI, mildly dil LA, nl RV.   COPD (chronic obstructive pulmonary disease) (HCC)    a. Chronic bronchitis and emphysema.   DDD (degenerative disc disease), lumbar    Diverticulosis    Dysrhythmia    Essential hypertension    GERD (gastroesophageal reflux disease)    History of hiatal hernia    History of kidney stones    H/O   History of tobacco abuse    a. Quit 2014.   Myocardial infarction [facility]) 2002   4 STENTS   Pancreatitis    PSVT (paroxysmal supraventricular tachycardia) (HCC)    a. 10/2012 Noted on Zio Patch.   Sleep apnea    LOST CORD TO CPAP -ONLY 02 @ BEDTIME   Tubular adenoma of colon    Type II diabetes mellitus (HCC)     Assessment: SW completed a telephone outreach with patient, he states he and his grandson started the application for applying for foodstamps, but he could not remember the answer to a question on the application. Patient does  receive $225 monthly from [facility] and receives MOW. Patient states his neighbor takes them to the grocery store and he uses his Medicaid for appointments. SW and patient agreed for food pantry resources to be mailed to address on file.    SDOH Interventions    Flowsheet Row Patient Outreach from 04/21/2024 in [place] POPULATION HEALTH DEPARTMENT Telephone from 03/28/2024 in [place] POPULATION HEALTH DEPARTMENT Patient Outreach Telephone from 03/22/2024 in [place] POPULATION HEALTH DEPARTMENT Patient Outreach Telephone from 03/21/2024 in [place] POPULATION HEALTH DEPARTMENT Clinical Support from 03/14/2024 in [facility] Patient Outreach from 02/28/2024 in  POPULATION HEALTH DEPARTMENT  SDOH Interventions        Food Insecurity Interventions Community Resources Provided Other (Comment)  [Patient is receiving Meals on Wheels and is awaiting SNAP benefit approval.Verified patient's home address to mail food pantry list.] -- -- Intervention Not Indicated Other (Comment)  [Reports currently receiving Meals on Wheels and Psychologist, occupational for [employer] benefits]  Housing Interventions -- -- -- -- Intervention Not Indicated Intervention Not Indicated  Transportation Interventions -- Other (Comment), Payor Benefit  [Patient has Medicaid transportation for medical apppointments. Gave number for [employer] transportation.] -- -- Intervention Not Indicated, Patient Resources Dietitian) Other (Comment)  [Reports  receiving transportation assistance]  Utilities Interventions -- -- -- -- Intervention Not Indicated Intervention Not Indicated  Alcohol Usage Interventions -- -- -- -- Intervention Not Indicated (Score <7) Intervention Not Indicated (Score <7)  Depression Interventions/Treatment  -- -- Counseling, Currently on Treatment Counseling, Currently on Treatment -- --  Financial Strain Interventions -- -- -- -- Intervention Not Indicated Other (Comment)   [Declined current need for additional financial resources]  Physical Activity Interventions -- -- -- -- Intervention Not Indicated Other (Comments)  [Limited Mobility and exercise tolerance due to advanced heart disease]  Stress Interventions -- -- -- -- Intervention Not Indicated --  Social Connections Interventions -- -- -- -- Intervention Not Indicated Intervention Not Indicated, Other (Comment)  [Patient declines current need for community/social resources]  Health Literacy Interventions -- -- -- -- Intervention Not Indicated --       Recommendation:   No recommendations at this time.  Follow Up Plan:   Telephone follow-up 05/05/24 at 12:30pm  Valora Gear, BSW, MHA [place]  [facility] Social Worker, Population Health [phone]

## 2024-04-23 ENCOUNTER — Other Ambulatory Visit: Payer: Self-pay

## 2024-04-23 ENCOUNTER — Emergency Department
Admission: EM | Admit: 2024-04-23 | Discharge: 2024-04-24 | Disposition: A | Discharge status: Home / Self Care | Attending: Emergency Medicine | Admitting: Emergency Medicine

## 2024-04-23 DIAGNOSIS — I2510 Atherosclerotic heart disease of native coronary artery without angina pectoris: Secondary | ICD-10-CM | POA: Diagnosis not present

## 2024-04-23 DIAGNOSIS — Z7901 Long term (current) use of anticoagulants: Secondary | ICD-10-CM | POA: Insufficient documentation

## 2024-04-23 DIAGNOSIS — J449 Chronic obstructive pulmonary disease, unspecified: Secondary | ICD-10-CM | POA: Insufficient documentation

## 2024-04-23 DIAGNOSIS — E119 Type 2 diabetes mellitus without complications: Secondary | ICD-10-CM | POA: Diagnosis not present

## 2024-04-23 DIAGNOSIS — R079 Chest pain, unspecified: Secondary | ICD-10-CM | POA: Insufficient documentation

## 2024-04-23 LAB — URINALYSIS, ROUTINE W REFLEX MICROSCOPIC
Bacteria, UA: NONE SEEN
Bilirubin Urine: NEGATIVE
Glucose, UA: >=500 mg/dL — AB
Hgb urine dipstick: NEGATIVE
Ketones, ur: NEGATIVE mg/dL
Leukocytes,Ua: NEGATIVE
Nitrite: NEGATIVE
Protein, ur: NEGATIVE mg/dL
Specific Gravity, Urine: 1.023 (ref 1.005–1.030)
Squamous Epithelial / HPF: 0 /HPF (ref 0–5)
pH: 5 (ref 5.0–8.0)

## 2024-04-23 LAB — TROPONIN I (HIGH SENSITIVITY)
Troponin I (High Sensitivity): 10 ng/L (ref <18)
Troponin I (High Sensitivity): 10 ng/L (ref <18)

## 2024-04-23 LAB — MAGNESIUM: Magnesium: 2.2 mg/dL (ref 1.7–2.4)

## 2024-04-23 LAB — CBC
HCT: 50.6 % (ref 39.0–52.0)
Hemoglobin: 16.2 g/dL (ref 13.0–17.0)
MCH: 27.6 pg (ref 26.0–34.0)
MCHC: 32 g/dL (ref 30.0–36.0)
MCV: 86.3 fL (ref 80.0–100.0)
Platelets: 152 10*3/uL (ref 150–400)
RBC: 5.86 MIL/uL — ABNORMAL HIGH (ref 4.22–5.81)
RDW: 18.1 % — ABNORMAL HIGH (ref 11.5–15.5)
WBC: 6.6 10*3/uL (ref 4.0–10.5)
nRBC: 0 % (ref 0.0–0.2)

## 2024-04-23 LAB — BASIC METABOLIC PANEL WITH GFR
Anion gap: 9 (ref 5–15)
BUN: 22 mg/dL (ref 8–23)
CO2: 28 mmol/L (ref 22–32)
Calcium: 9.6 mg/dL (ref 8.9–10.3)
Chloride: 104 mmol/L (ref 98–111)
Creatinine, Ser: 1.57 mg/dL — ABNORMAL HIGH (ref 0.61–1.24)
GFR, Estimated: 47 mL/min — ABNORMAL LOW (ref >60)
Glucose, Bld: 124 mg/dL — ABNORMAL HIGH (ref 70–99)
Potassium: 4.4 mmol/L (ref 3.5–5.1)
Sodium: 141 mmol/L (ref 135–145)

## 2024-04-23 LAB — HEPATIC FUNCTION PANEL
ALT: 18 U/L (ref 0–44)
AST: 19 U/L (ref 15–41)
Albumin: 3.8 g/dL (ref 3.5–5.0)
Alkaline Phosphatase: 61 U/L (ref 38–126)
Bilirubin, Direct: 0.1 mg/dL (ref 0.0–0.2)
Indirect Bilirubin: 0.6 mg/dL (ref 0.3–0.9)
Total Bilirubin: 0.7 mg/dL (ref 0.0–1.2)
Total Protein: 6.8 g/dL (ref 6.5–8.1)

## 2024-04-23 LAB — D-DIMER, QUANTITATIVE: D-Dimer, Quant: <0.27 ug{FEU}/mL (ref 0.00–0.50)

## 2024-04-23 LAB — CBG MONITORING, ED: Glucose-Capillary: 120 mg/dL — ABNORMAL HIGH (ref 70–99)

## 2024-04-23 MED ORDER — MORPHINE SULFATE (PF) 4 MG/ML IV SOLN
4.0000 mg | Freq: Once | INTRAVENOUS | Status: AC
  Administered 2024-04-23: 4 mg via INTRAVENOUS
  Filled 2024-04-23: qty 1

## 2024-04-23 MED ORDER — NALOXONE HCL 2 MG/2ML IJ SOSY
0.4000 mg | PREFILLED_SYRINGE | Freq: Once | INTRAMUSCULAR | Status: DC
  Filled 2024-04-23: qty 2

## 2024-04-23 MED ORDER — ALUM & MAG HYDROXIDE-SIMETH 200-200-20 MG/5ML PO SUSP
30.0000 mL | Freq: Once | ORAL | Status: AC
  Administered 2024-04-23: 30 mL via ORAL
  Filled 2024-04-23: qty 30

## 2024-04-23 MED ORDER — NITROGLYCERIN 0.4 MG SL SUBL: 0.4000 mg | SUBLINGUAL_TABLET | SUBLINGUAL | Status: DC | PRN

## 2024-04-23 MED ORDER — SUCRALFATE 1 G PO TABS: 1.0000 g | ORAL_TABLET | Freq: Three times a day (TID) | ORAL | 0 refills | Status: DC | PRN

## 2024-04-23 MED ORDER — OXYCODONE-ACETAMINOPHEN 5-325 MG PO TABS
1.0000 | ORAL_TABLET | Freq: Once | ORAL | Status: AC
  Administered 2024-04-23: 1 via ORAL
  Filled 2024-04-23: qty 1

## 2024-04-23 NOTE — Discharge Instructions (Signed)
 Follow-up with your primary care doctor for further evaluation of your chest pain.  I sent a prescription for a medicine to help with possible GI symptoms contributing to your chest pain.  Return to the ER for new or worsening symptoms.

## 2024-04-23 NOTE — ED Triage Notes (Signed)
 Pt to ed from home via [facility] for malaise. Pt also called ems for hyperglycemia. Pt is caox4, in no acute distress and in wheel chair from EMS> Per EMS pt ambulatory on scene.    125/76 87 HR  14 RR  95%  RA  111 BGL

## 2024-04-23 NOTE — ED Provider Notes (Signed)
 [facility] Provider Note    Event Date/Time   First MD Initiated Contact with Patient 04/23/24 2000     (approximate)   History   Hyperglycemia   HPI  Lawrence [name] is a 68 year old male with history of T2DM, CAD, A-fib, COPD presenting to the emergency department for evaluation of chest pain.  Did review triage note noting concern for hyperglycemia and malaise, but patient tells me that his chief concern is chest pain that started around 8 PM today.  Does report some generalized weakness.  I do note that patient has had multiple visits for chest pain this year both at our hospital and and others.  I read his discharge summary from 02/10/2024.  At that time he was found to have recurrent chest pain with negative stress test, Imdur  increased at that time with no further planned cardiac workup. Also noted ER visit here from 5/2 with negative workup including negative dissection study.    Physical Exam   Triage Vital Signs: ED Triage Vitals  Encounter Vitals Group     BP 04/23/24 1946 112/78     Systolic BP Percentile --      Diastolic BP Percentile --      Pulse Rate 04/23/24 1946 94     Resp 04/23/24 1946 20     Temp 04/23/24 1946 98.1 F (36.7 C)     Temp Source 04/23/24 1946 Oral     SpO2 04/23/24 1946 96 %     Weight --      Height 04/23/24 1929 5\' 7"  (1.702 m)     Head Circumference --      Peak Flow --      Pain Score 04/23/24 1929 0     Pain Loc --      Pain Education --      Exclude from Growth Chart --     Most recent vital signs: Vitals:   04/23/24 2200 04/23/24 2300  BP: 116/86 (!) 129/93  Pulse: 71 (!) 57  Resp: 12 10  Temp:    SpO2: 95% 95%     General: Awake, interactive  CV:  Regular rate, good peripheral perfusion.  Resp:  Unlabored respirations, lungs clear to auscultation Abd:  Nondistended.  Neuro:  Symmetric facial movement, fluid speech   ED Results / Procedures / Treatments   Labs (all labs ordered are  listed, but only abnormal results are displayed) Labs Reviewed  BASIC METABOLIC PANEL WITH GFR - Abnormal; Notable for the following components:      Result Value   Glucose, Bld 124 (*)    Creatinine, Ser 1.57 (*)    GFR, Estimated 47 (*)    All other components within normal limits  CBC - Abnormal; Notable for the following components:   RBC 5.86 (*)    RDW 18.1 (*)    All other components within normal limits  URINALYSIS, ROUTINE W REFLEX MICROSCOPIC - Abnormal; Notable for the following components:   Color, Urine YELLOW (*)    APPearance CLEAR (*)    Glucose, UA >=500 (*)    All other components within normal limits  CBG MONITORING, ED - Abnormal; Notable for the following components:   Glucose-Capillary 120 (*)    All other components within normal limits  HEPATIC FUNCTION PANEL  MAGNESIUM   D-DIMER, QUANTITATIVE  CBG MONITORING, ED  TROPONIN I (HIGH SENSITIVITY)  TROPONIN I (HIGH SENSITIVITY)     EKG EKG independently reviewed and interpreted by myself demonstrates:  EKG demonstrates A-fib at a rate of 80, QRS 90, QTc 440, no acute ST changes  RADIOLOGY Imaging independently reviewed and interpreted by myself demonstrates:    Formal Radiology Read:  No results found.  PROCEDURES:  Critical Care performed: No  Procedures   MEDICATIONS ORDERED IN ED: Medications  oxyCODONE -acetaminophen  (PERCOCET/ROXICET) 5-325 MG per tablet 1 tablet (1 tablet Oral Given 04/23/24 2048)  morphine  (PF) 4 MG/ML injection 4 mg (4 mg Intravenous Given 04/23/24 2148)  alum & mag hydroxide-simeth (MAALOX/MYLANTA) 200-200-20 MG/5ML suspension 30 mL (30 mLs Oral Given 04/23/24 2313)  morphine  (PF) 4 MG/ML injection 4 mg (4 mg Intravenous Given 04/23/24 2314)     IMPRESSION / MDM / ASSESSMENT AND PLAN / ED COURSE  I reviewed the triage vital signs and the nursing notes.  Differential diagnosis includes, but is not limited to, ACS, anemia, electrolyte abnormality, exacerbation of chronic  pain  Patient's presentation is most consistent with acute presentation with potential threat to life or bodily function.  68 year old male presenting to the emergency department for evaluation of chest pain.  Stable vitals on presentation.  Labs overall reassuring including negative troponin x 2.  Negative D-dimer.  Patient reports his pain is only responsive to morphine  which she did receive here with improvement in his pain.  I do note he has had multiple similar presentations with suspected noncardiac pain.  Recent negative dissection study.  There was some consideration that it may be GI mediated pain.  Patient has been taking his PPI, will DC with prescription for sucralfate .  Strict return precautions provided.  Patient discharged in stable condition.      FINAL CLINICAL IMPRESSION(S) / ED DIAGNOSES   Final diagnoses:  Nonspecific chest pain     Rx / DC Orders   ED Discharge Orders          Ordered    sucralfate  (CARAFATE ) 1 g tablet  Every 8 hours PRN        04/23/24 2344             Note:  This document was prepared using Dragon voice recognition software and may include unintentional dictation errors.   Claria Crofts, MD 04/23/24 [phone]

## 2024-04-23 NOTE — ED Notes (Signed)
 Provider Ray, MD notified pt is requesting additional pain medication

## 2024-04-24 ENCOUNTER — Other Ambulatory Visit: Payer: Self-pay

## 2024-04-24 NOTE — Patient Outreach (Unsigned)
 Complex Care Management   Visit Note  04/24/2024  Name:  Lawrence [name] MRN: [id number] DOB: [birth year]  Situation: Referral received for Complex Care Management related to {Criteria:32550} I obtained verbal consent from {CHL AMB Patient/Caregiver:28184}.  Visit completed with ***  {VISIT LOCATION:32553}  Background:   Past Medical History:  Diagnosis Date   A-fib (HCC)    Anemia    Anginal pain (HCC)    Anxiety    Arthritis    Asthma    CAD (coronary artery disease)    a. 2002 CABGx2 (LIMA->LAD, VG->VG->OM1);  b. 09/2012 DES->OM;  c. 03/2015 PTCA of LAD [facility]) in setting of atretic LIMA; d. 05/2015 Cath [facility]): nonobs dzs; e. 06/2015 Cath ([facility]): LM nl, LAD 45p/d ISR, 50d, D1/2 small, LCX 50p/d ISR, OM1 70ost, 30 ISR, VG->OM1 50ost, 35m, LIMA->LAD 99p/d - atretic, RCA dom, nl; f.cath 10/16: 40-50%(FFR 0.90) pLAD, 75% (FFR 0.77) mLAD s/p PCI/DES, oRCA 40% (FFR0.95)   Cancer (HCC)    SKIN CANCER ON BACK   Celiac disease    Chronic diastolic CHF (congestive heart failure) (HCC)    a. 06/2009 Echo: EF 60-65%, Gr 1 DD, triv AI, mildly dil LA, nl RV.   COPD (chronic obstructive pulmonary disease) (HCC)    a. Chronic bronchitis and emphysema.   DDD (degenerative disc disease), lumbar    Diverticulosis    Dysrhythmia    Essential hypertension    GERD (gastroesophageal reflux disease)    History of hiatal hernia    History of kidney stones    H/O   History of tobacco abuse    a. Quit 2014.   Myocardial infarction [facility]) 2002   4 STENTS   Pancreatitis    PSVT (paroxysmal supraventricular tachycardia) (HCC)    a. 10/2012 Noted on Zio Patch.   Sleep apnea    LOST CORD TO CPAP -ONLY 02 @ BEDTIME   Tubular adenoma of colon    Type II diabetes mellitus (HCC)     Assessment: Patient Reported Symptoms:  Cognitive        Neurological      HEENT        Cardiovascular      Respiratory      Endocrine      Gastrointestinal        Genitourinary      Integumentary       Musculoskeletal          Psychosocial              03/22/2024    1:51 PM  Depression screen PHQ 2/9  Decreased Interest 1  Down, Depressed, Hopeless 1  PHQ - 2 Score 2  Tired, decreased energy 1  Change in appetite 0  Feeling bad or failure about yourself  0  Trouble concentrating 0  Moving slowly or fidgety/restless 0  Suicidal thoughts 0    There were no vitals filed for this visit.  Medications Reviewed Today     Reviewed by Roxie Cord, RN (Registered Nurse) on 04/24/24 at 1505  Med List Status: <None>   Medication Order Taking? Sig Documenting Provider Last Dose Status Informant  Accu-Chek Softclix Lancets lancets [id number] No Use as instructed to check sugar daily for type 2 diabetes. Lamon Pillow, MD Taking Active Pharmacy Records, Other  allopurinol  (ZYLOPRIM ) 300 MG tablet [id number]  TAKE 1 TABLET BY MOUTH TWICE A DAY Lamon Pillow, MD  Active   ALPRAZolam  (XANAX ) 1 MG tablet [id number] No TAKE 1 TABLET BY MOUTH  3 TIMES A DAY AS NEEDED [name], Erlinda Haws, MD Taking Active   aluminum -magnesium  hydroxide-simethicone  (MAALOX) 200-200-20 MG/5ML SUSP [id number] No Take 30 mLs by mouth 4 (four) times daily -  before meals and at bedtime. Jacquie Maudlin, MD Taking Active   amLODipine  (NORVASC ) 10 MG tablet [id number] No Take 10 mg by mouth daily. [provider] Taking Active Self  apixaban  (ELIQUIS ) 5 MG TABS tablet [id number] No Take 1 tablet (5 mg total) by mouth 2 (two) times daily. Lamon Pillow, MD Taking Active Pharmacy Records, Other  atorvastatin  (LIPITOR ) 80 MG tablet [id number] No TAKE 1 TABLET BY MOUTH AT BEDTIME Lamon Pillow, MD Taking Active Pharmacy Records, Other  Blood Glucose Calibration (ACCU-CHEK GUIDE CONTROL) LIQD [id number] No Use with blood glucose monitor as directed Lamon Pillow, MD Taking Active   Blood Glucose Monitoring Suppl (ACCU-CHEK GUIDE) w/Device KIT [id number] No Use to check blood sugars as directed Lamon Pillow, MD Taking Active   Budeson-Glycopyrrol-Formoterol  (BREZTRI  AEROSPHERE) 160-9-4.8 MCG/ACT AERO [id number] No Inhale 2 puffs into the lungs 2 (two) times daily. Lamon Pillow, MD Taking Active Pharmacy Records, Other  carvedilol  (COREG ) 3.125 MG tablet [id number] No TAKE 1 TABLET BY MOUTH TWICE A DAY Lamon Pillow, MD Taking Active   cetirizine  (ZYRTEC ) 10 MG tablet [id number]  Take 1 tablet (10 mg total) by mouth at bedtime. Lamon Pillow, MD  Active   Cyanocobalamin  (VITAMIN B-12) 1000 MCG SUBL [id number] No Place 1 tablet under the tongue daily. [provider] Taking Active Pharmacy Records, Other  dapagliflozin  propanediol (FARXIGA ) 10 MG TABS tablet [id number] No TAKE 1 TABLET BY MOUTH DAILY [name], Erlinda Haws, MD Taking Active   famotidine  (PEPCID ) 20 MG tablet [id number]  Take 1 tablet (20 mg total) by mouth 2 (two) times daily. Lamon Pillow, MD  Active   furosemide  (LASIX ) 20 MG tablet [id number]  Take 1 tablet (20 mg total) by mouth daily as needed. Lamon Pillow, MD  Active   gabapentin  (NEURONTIN ) 300 MG capsule [id number] No Take 1 capsule at bedtime for 1 week, then increase to 1 twice a day Lamon Pillow, MD Taking Active   glucose blood (ACCU-CHEK GUIDE) test strip [id number] No Use as instructed to check sugar daily for type 2 diabetes. Lamon Pillow, MD Taking Active Pharmacy Records, Other  hydrOXYzine  (VISTARIL ) 25 MG capsule [id number] No Take 1 capsule (25 mg total) by mouth 3 (three) times daily as needed. Lubertha Rush, MD Taking Active   iron  polysaccharides (FERREX 150) 150 MG capsule [id number] No TAKE 1 CAPSULE BY MOUTH DAILY Lamon Pillow, MD Taking Active Pharmacy Records, Other  isosorbide  mononitrate (IMDUR ) 120 MG 24 hr tablet [id number] No Take 1 tablet (120 mg total) by mouth daily. Althia Atlas, MD Taking Active   lithium carbonate 150 MG capsule [id number] No Take by mouth. [provider] Taking Active   metFORMIN  (GLUCOPHAGE -XR) 500  MG 24 hr tablet [id number] No TAKE 1 TABLET BY MOUTH DAILY WITH EVENING MEAL Lamon Pillow, MD Taking Active   methocarbamol  (ROBAXIN ) 500 MG tablet [id number] No TAKE 1 TABLET BY MOUTH EVERY 8 HOURS AS NEEDED FOR MUSCLE SPASMS Lamon Pillow, MD Taking Active   metoCLOPramide  (REGLAN ) 10 MG tablet [id number] No Take 1 tablet (10 mg total) by mouth 4 (four) times daily -  before meals and at bedtime for 15 days. Jacquie Maudlin, MD Taking Expired 03/27/24 2359   metoprolol  succinate (TOPROL -XL) 25 MG 24  hr tablet [id number] No Take 12.5 mg by mouth daily. [provider] Taking Active   nitroGLYCERIN  (NITROSTAT ) 0.4 MG SL tablet [id number] No Place 1 tablet (0.4 mg total) under the tongue every 5 (five) minutes as needed for chest pain (Do not take if blood pressure is low, systolic BP<110). Sheril Dines, MD Taking Active            Med Note Dessa Floss, [facility]   Tue Mar 21, 2024 12:58 PM) Needs new order  omega-3 acid ethyl esters (LOVAZA ) 1 g capsule [id number] No TAKE 4 CAPSULES (4 GRAMS TOTAL) BY Suzzanna Estimable, MD Taking Active   oxyCODONE -acetaminophen  (PERCOCET) 10-325 MG tablet [id number] No TAKE 1 TABLET BY MOUTH EVERY 4 HOURS AS NEEDED FOR PAIN Lamon Pillow, MD Taking Active   OXYGEN  [id number] No Inhale 2 L into the lungs at bedtime as needed (for shortness of breath). [provider] Taking Active Pharmacy Records, Other  pantoprazole  (PROTONIX ) 40 MG tablet [id number] No TAKE 1 TABLET BY MOUTH 2 TIMES DAILY ( BEFORE A MEAL) Lamon Pillow, MD Taking Active   ranolazine  (RANEXA ) 1000 MG SR tablet [id number] No Take 1 tablet (1,000 mg total) by mouth 2 (two) times daily. Aisha Hove, MD Taking Active Pharmacy Records, Other  sacubitril -valsartan  (ENTRESTO ) 24-26 MG [id number] No Take 1 tablet by mouth 2 (two) times daily. [provider] Taking Active Self  sennosides-docusate sodium  (SENOKOT-S) 8.6-50 MG tablet [id number] No Take 1  tablet by mouth daily. [provider] Taking Active Self           Med Note Dessa Floss, [facility]   Tue Mar 21, 2024 12:59 PM) Reports taking as needed  spironolactone  (ALDACTONE ) 25 MG tablet [id number] No Take 1 tablet (25 mg total) by mouth daily. Lamon Pillow, MD Taking Active   sucralfate  (CARAFATE ) 1 g tablet [id number]  Take 1 tablet (1 g total) by mouth every 8 (eight) hours as needed for up to 10 days. Claria Crofts, MD  Active   sucralfate  (CARAFATE ) 1 GM/10ML suspension [id number] No Take 10 mLs (1 g total) by mouth 4 (four) times daily. Buell Carmin, MD Taking Active   traZODone  (DESYREL ) 150 MG tablet [id number]  Take 1 tablet (150 mg total) by mouth at bedtime as needed for sleep. Lamon Pillow, MD  Active   venlafaxine  (EFFEXOR ) 25 MG tablet [id number] No Take 25 mg by mouth daily. [provider] Taking Active             Recommendation:   {RECOMMENDATONS:32554}  Follow Up Plan:   {FOLLOWUP:32559}  SIG ***

## 2024-04-24 NOTE — Patient Instructions (Signed)
Thank you for allowing the Chronic Care Management team to participate in your care.  

## 2024-04-26 ENCOUNTER — Other Ambulatory Visit: Payer: Self-pay | Admitting: Family Medicine

## 2024-04-26 ENCOUNTER — Telehealth: Payer: Self-pay | Admitting: *Deleted

## 2024-04-26 NOTE — Patient Outreach (Signed)
 Phone call from patient requesting update on order for hospital bed. Collaboration phone call to [employer], confirmed that they had the incorrect phone number for patient. Phone number corrected-order placed. Patient should hear from the driver this afternoon or in the morning with estimated time of arrival to deliver hospital bed.  It was confirmed that patient has a balance on his account for $38.08 for his  wheelchair and home fill unit. Billing department contacted-Balance of $21.42  for wheelchair transferred to [facility] . The remaining balance of $26.66 (home fill unit) required a  supervisor correction due to a mistake with the secondary insurance. Account balance has now been suspended-patient now has a zero balance.   Braven Wolk, LCSW [place]  [facility], [facility] Licensed Clinical Social Worker  Direct Dial: [phone]

## 2024-04-26 NOTE — Telephone Encounter (Signed)
 Copied from CRM [id number]. Topic: Clinical - Medication Refill >> Apr 26, 2024  9:25 AM Everlene Hobby D wrote: Medication: oxyCODONE -acetaminophen  (PERCOCET) 10-325 MG tablet, ALPRAZolam  (XANAX ) 1 MG tablet  Has the patient contacted their pharmacy? Yes (Agent: If no, request that the patient contact the pharmacy for the refill. If patient does not wish to contact the pharmacy document the reason why and proceed with request.) (Agent: If yes, when and what did the pharmacy advise?)  This is the patient's preferred pharmacy:  [facility] - [place], [place] - [address] [place] [address] Phone: [phone] Fax: [phone]     Is this the correct pharmacy for this prescription? Yes If no, delete pharmacy and type the correct one.   Has the prescription been filled recently? No  Is the patient out of the medication? Yes  Has the patient been seen for an appointment in the last year OR does the patient have an upcoming appointment? Yes  Can we respond through MyChart? No  Agent: Please be advised that Rx refills may take up to 3 business days. We ask that you follow-up with your pharmacy.

## 2024-04-27 NOTE — Telephone Encounter (Signed)
 Requested medication (s) are due for refill today: no  Requested medication (s) are on the active medication list: yes  Last refill:  04/27/24  Future visit scheduled: yes  Notes to clinic:  unable to refuse, no refill needed     Requested Prescriptions  Pending Prescriptions Disp Refills   oxyCODONE -acetaminophen  (PERCOCET) 10-325 MG tablet 180 tablet 0    Sig: Take 1 tablet by mouth every 4 (four) hours as needed. for pain     Not Delegated - Analgesics:  Opioid Agonist Combinations Failed - 04/27/2024  9:53 AM      Failed - This refill cannot be delegated      Passed - Urine Drug Screen completed in last 360 days      Passed - Valid encounter within last 3 months    Recent Outpatient Visits           3 weeks ago OSA (obstructive sleep apnea)   [facility] Lamon Pillow, MD   1 month ago Depression, major, single episode, in partial remission [facility])   [facility] Lamon Pillow, MD   3 months ago Neuropathic pain of chest   [facility] Lamon Pillow, MD   3 months ago Other chest pain   [facility] Lamon Pillow, MD

## 2024-04-28 ENCOUNTER — Other Ambulatory Visit: Payer: Self-pay

## 2024-05-03 ENCOUNTER — Telehealth: Payer: Self-pay

## 2024-05-03 NOTE — Telephone Encounter (Signed)
 Copied from CRM [id number]. Topic: Clinical - Order For Equipment >> May 01, 2024 11:43 AM Corin V wrote: Reason for CRM: [employer] case manager calling and asking if an order and notes for a nebulizer and portable oxygen  concentrator (for 2-2.5L continuous) can be sent to: [facility] P: [phone] F: [phone]

## 2024-05-04 NOTE — Telephone Encounter (Signed)
 That's fine, but I am out of the office until Friday the 27th, so I am not able to sign any orders.

## 2024-05-05 ENCOUNTER — Other Ambulatory Visit: Payer: Self-pay

## 2024-05-05 ENCOUNTER — Telehealth: Payer: Self-pay

## 2024-05-05 NOTE — Patient Instructions (Signed)
 Visit Information  Thank you for taking time to visit with me today. Please don't hesitate to contact me if I can be of assistance to you before our next scheduled appointment.  Your next care management appointment is no further scheduled appointments.      Please call the care guide team at [phone] if you need to cancel, schedule, or reschedule an appointment.   Please call the Suicide and Crisis Lifeline: 988 call the [place]  National Suicide Prevention Lifeline: (205) 661-1128 or TTY: 7576086345 TTY 248-735-9102) to talk to a trained counselor call 1-800-273-TALK (toll free, 24 hour hotline) call 911 if you are experiencing a Mental Health or Behavioral Health Crisis or need someone to talk to.  Valora Gear, Florestine Hurl, MHA [place]  [facility] Social Worker, Population Health [phone]

## 2024-05-05 NOTE — Telephone Encounter (Signed)
 Copied from CRM [id number]. Topic: General - Other >> May 05, 2024 12:39 PM Emylou G wrote: Reason for CRM: Patient called.Aaron Aas adv psych put him on TRINTELLIX and lithium carbonate 150 MG capsule.Aaron Aas and asked him to stop taking venlafaxine  (EFFEXOR ) 25 MG tablet so as to not mix them.  He asked to remove the venlafaxine  off the list. He just wanted to relay the msg

## 2024-05-05 NOTE — Patient Outreach (Signed)
 Complex Care Management   Visit Note  05/05/2024  Name:  Lawrence [name] MRN: [id number] DOB: 1956-05-29  Situation: Referral received for Complex Care Management related to SDOH Barriers:  Food insecurity I obtained verbal consent from Patient.  Visit completed with patient  on the phone  Background:   Past Medical History:  Diagnosis Date   A-fib (HCC)    Anemia    Anginal pain (HCC)    Anxiety    Arthritis    Asthma    CAD (coronary artery disease)    a. 2002 CABGx2 (LIMA->LAD, VG->VG->OM1);  b. 09/2012 DES->OM;  c. 03/2015 PTCA of LAD [facility]) in setting of atretic LIMA; d. 05/2015 Cath [facility]): nonobs dzs; e. 06/2015 Cath ([facility]): LM nl, LAD 45p/d ISR, 50d, D1/2 small, LCX 50p/d ISR, OM1 70ost, 30 ISR, VG->OM1 50ost, 43m, LIMA->LAD 99p/d - atretic, RCA dom, nl; f.cath 10/16: 40-50%(FFR 0.90) pLAD, 75% (FFR 0.77) mLAD s/p PCI/DES, oRCA 40% (FFR0.95)   Cancer (HCC)    SKIN CANCER ON BACK   Celiac disease    Chronic diastolic CHF (congestive heart failure) (HCC)    a. 06/2009 Echo: EF 60-65%, Gr 1 DD, triv AI, mildly dil LA, nl RV.   COPD (chronic obstructive pulmonary disease) (HCC)    a. Chronic bronchitis and emphysema.   DDD (degenerative disc disease), lumbar    Diverticulosis    Dysrhythmia    Essential hypertension    GERD (gastroesophageal reflux disease)    History of hiatal hernia    History of kidney stones    H/O   History of tobacco abuse    a. Quit 2014.   Myocardial infarction [facility]) 2002   4 STENTS   Pancreatitis    PSVT (paroxysmal supraventricular tachycardia) (HCC)    a. 10/2012 Noted on Zio Patch.   Sleep apnea    LOST CORD TO CPAP -ONLY 02 @ BEDTIME   Tubular adenoma of colon    Type II diabetes mellitus (HCC)     Assessment: SW completed a telephone outreach with patient, he did receive the resources SW sent. Patient has not completed his application for foodstamps. No other resources are needed at this time.  SDOH Interventions    Flowsheet Row  Patient Outreach from 04/21/2024 in [place] POPULATION HEALTH DEPARTMENT Telephone from 03/28/2024 in [place] POPULATION HEALTH DEPARTMENT Patient Outreach Telephone from 03/22/2024 in [place] POPULATION HEALTH DEPARTMENT Patient Outreach Telephone from 03/21/2024 in [place] POPULATION HEALTH DEPARTMENT Clinical Support from 03/14/2024 in [facility] Patient Outreach from 02/28/2024 in [place] POPULATION HEALTH DEPARTMENT  SDOH Interventions        Food Insecurity Interventions Community Resources Provided Other (Comment)  [Patient is receiving Meals on Wheels and is awaiting SNAP benefit approval.Verified patient's home address to mail food pantry list.] -- -- Intervention Not Indicated Other (Comment)  [Reports currently receiving Meals on Wheels and Psychologist, occupational for [employer] benefits]  Housing Interventions -- -- -- -- Intervention Not Indicated Intervention Not Indicated  Transportation Interventions -- Other (Comment), Payor Benefit  [Patient has Medicaid transportation for medical apppointments. Gave number for [employer] transportation.] -- -- Intervention Not Indicated, Patient Resources (Friends/Family) Other (Comment)  [Reports receiving transportation assistance]  Utilities Interventions -- -- -- -- Intervention Not Indicated Intervention Not Indicated  Alcohol Usage Interventions -- -- -- -- Intervention Not Indicated (Score <7) Intervention Not Indicated (Score <7)  Depression Interventions/Treatment  -- -- Counseling, Currently on Treatment Counseling, Currently on Treatment -- --  Financial Strain Interventions -- -- -- -- Intervention Not Indicated Other (Comment)  [Declined current need for additional financial resources]  Physical Activity Interventions -- -- -- -- Intervention Not Indicated Other (Comments)  [Limited Mobility and exercise tolerance due to advanced heart disease]  Stress Interventions -- -- -- -- Intervention Not Indicated  --  Social Connections Interventions -- -- -- -- Intervention Not Indicated Intervention Not Indicated, Other (Comment)  [Patient declines current need for community/social resources]  Health Literacy Interventions -- -- -- -- Intervention Not Indicated --    Recommendation:   No recommendations at this time.  Follow Up Plan:   Patient has met all care management goals. Care Management case will be closed. Patient has been provided contact information should new needs arise.   Valora Gear, Florestine Hurl, MHA [place]  [facility] Social Worker, Population Health [phone]

## 2024-05-09 NOTE — Telephone Encounter (Signed)
 I have clinic notes and verbal order at my desk-waiting on Dr.[name] for further information

## 2024-05-10 NOTE — Progress Notes (Signed)
 [facility] at [facility] [address], [place], [place] [address]  Phone: [phone] Fax: [phone]  Date of Service: 05/12/2024  Patient Clinic Note  PCP: Referring Provider:  [name], MD [address] [place] [address] Phone: [phone] Fax: [phone] Billy Hoy Cutting, MD [address] [place],  [place] [address] Phone: [phone] Fax: [phone]    Assessment and Plan:   Mr. Dygert is a 68 y.o. 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  Hospital follow-up, ED visit follow-up Noncardiac chest pain - Multiple ER visits for chest pain since last visit.  Multiple reassuring workups including CTAs and troponins. I continue to think this is musculoskeletal versus GI related.  Patulous esophagus and evidence of reflux noted on his last CT chest.  Continue PPI, recently started.  EGD ordered again, never got done after our last visit.   Heart failure with recovered ejection fraction, likely ischemic History of type II MI Diffuse nonobstructive coronary artery disease History of CABG 2002 History of PCI Hyperlipidemia Hypertension - NSTEMI 08/2023. No culprit lesion identified, consistent with type II MI in the setting of nonobstructive coronary artery disease and atretic LIMA. - Continue GDMT for his heart failure: Entresto , spironolactone , Farxiga .  Continue carvedilol  3.125 twice daily.  Would not uptitrate this due to intermittent bradycardia noted in the hospital  - Continue ranolazine , Imdur , although I continue to believe his chest pain is noncardiac - Cardiac rehab referral to [facility] --> could not make regular appointments due to transportation issues - Recently placed on Lasix  for lower extremity swelling, appears euvolemic and notes  some orthostasis recently.  Will stop daily Lasix  and instructed only use if needed for lower extremity swelling. -We discussed that his chest pain and symptoms of the above are best treated with aggressive medical therapy at this time.  there are no great targets for PCI and would need a very strong indication to repeat an ischemic evaluation.   Paroxysmal atrial fibrillation Intermittent slow ventricular response - With intermittent slow ventricular response noted in the hospital.  No syncope or presyncope at home.  I think it is okay to continue low-dose Coreg  but would not uptitrate - No symptoms or evidence to suggest pacemaker would be indicated for him. -Continue apixaban  twice a day  Type 2 diabetes. - Controlled on current regimen.  Continue   Lab Results  Component Value Date   CHOL 98 01/31/2024   CHOL 111 08/17/2023   CHOL 133 10/16/2018   Lab Results  Component Value Date   HDL 20 (L) 01/31/2024   HDL 48 08/17/2023   HDL 26 (L) 10/16/2018   Lab Results  Component Value Date   LDL 55 01/31/2024   LDL 40 08/17/2023   LDL 48 10/16/2018   Lab Results  Component Value Date   VLDL 23.2 01/31/2024   VLDL 23.4 08/17/2023   VLDL 59 10/16/2018   Lab Results  Component Value Date   CHOLHDLRATIO 4.9 (H) 01/31/2024   CHOLHDLRATIO 2.3 08/17/2023   CHOLHDLRATIO 5.1 10/16/2018   Lab Results  Component Value Date   TRIG 116 01/31/2024   TRIG 117 08/17/2023   TRIG 295 (H) 10/16/2018    The 10-yr ASCVD Risk score (Goff DC Jr., et al., 2013) failed to calculate due to the following reason:  The 2013 10-yr ASCVD risk score is only valid if the patient does not have prior/existing clinical ASCVD (myocardial infarction, stroke, CABG, coronary angioplasty, angina or peripheral arterial disease, coronary atherosclerosis, ischemic heart disease, or cerebrovascular disease). The patient has prior/existing ASCVD. Had CABG  Had Coronary Angioplasty Has Coronary  Atherosclerosis Has Ischemic Heart Disease   Lab Results  Component Value Date   A1C 5.8 (H) 01/31/2024     Return in about 6 months (around 11/11/2024).     Subjective:   Chief Complaint: Chief Complaint  Patient presents with  . Chest Pain  . Follow-up     Referring Provider: Billy Mays Ne*  History of Present Illness:   History of Present Illness He has experienced multiple emergency room visits for chest pain since his last appointment, with negative or reassuring workups. The chest pain is located in the center of the chest, radiating to the back and neck, and began overnight. It worsens with movement, such as walking to the mailbox, and is associated with shortness of breath and lightheadedness. He feels more tired than usual and experiences increased chest pain and difficulty breathing when moving around.  He was last seen in the emergency room at [facility] or [facility], where he was prescribed sucralfate  for presumed gastrointestinal issues. The sucralfate  helps with heartburn but does not alleviate the chest pain. He recalls a past CT scan that showed inflammation and two small ulcers in his intestines, which he suspects may be contributing to his current pain.  He is currently taking his medications, including furosemide  as needed for leg swelling, which he did not take recently as he did not experience swelling. He recalls being on Bumex previously but is now on a generic for Lasix . His history includes coronary artery disease with CABG and multiple PCIs, and ischemic heart failure with a recovered ejection fraction.  He feels tired, experiences some shortness of breath, and lightheadedness when walking. No episodes of syncope, but he mentions coming close. He denies any recent leg swelling.    ----presenting HPI ----  More recently, patient had an admission to [facility] campus for chest pain, and troponin elevation.  Left heart catheter  initiation revealed moderate complex calcified coronary disease, atretic LIMA.  Overall no significant culprit lesion sent, and no obvious PCI targets.  Patient was treated medically.  Echocardiogram that hospitalization revealed recovery of his LVEF.  His LVEDP was high, started on Bumex for 2 weeks.  Patient today notes he still having chest pain intermittently at home both at rest and with exertion.  Happens about 3 times per week, and with radiation to his left arm.  States it last for a few hours typically and is associated with dizziness and lightheadedness.   Cardiovascular History & Procedures: Cardiovascular Problems: Complex diffuse calcified coronary artery disease Hypertension Hyperlipidemia Atrial fibrillation Heart failure with recovered ejection fraction  Cath / PCI: 08/2023-Complex coronary artery disease. LM normal; LAD with moderate diffuse disease and patent stent; 50% ostial LCX with competitive flow in OM1; RCA with mild diffuse disease. 1/2 bypass grafts open: LIMA to LAD atretic, SVG to OM1 with moderate stenosis in the first portion of the graft body, similar to previous. Elevated LVEDP = 24 mmHg.  CV Surgery: CABG 2002 - [facility] - LIMA-LAD , vein graft-OM1. Sx prior to revascularization = elephant on chest, sharp chest pain   EP Procedures and Devices: None.  Non-Invasive Evaluation(s): independently reviewed the most recent study of each modality.  Echocardiogram 08/18/2023-LVEF  55-60%, moderate LVH, RV normal size and function Echocardiogram 08/08/2023-LVEF 30-35%.  Outside hospital. Echocardiogram 02/01/2024-mild LVH normal LVEF normal RV size and function  Medical History: Past Medical History:  Diagnosis Date  . CAD (coronary artery disease)    s/p anterior STEMI (95% LAD) on 04/27/01. Stent x2 to LAD; S/p iatrogenic pLAD dissection 04/27/01 -> CABG x2 (IMA->LAD, SVG->OM1+2) on 04/27/01 ; In 11/13, catheterization revealed 70% OM1 lesion -> PCI with Xience  stent.  . Chronic back pain   . COPD (chronic obstructive pulmonary disease)      . DM (diabetes mellitus)      . Enlarged prostate   . Fatty liver   . GERD (gastroesophageal reflux disease)   . HLD (hyperlipidemia)   . Hypertension   . Myocardial infarction      . OSA (obstructive sleep apnea)   . Pancreatitis (HHS-HCC)    last episode 2013 per pt    Surgical History: Past Surgical History:  Procedure Laterality Date  . ARTERIAL BYPASS SURGERY    . CARDIAC CATHETERIZATION    . CARDIAC SURGERY    . CHOLECYSTECTOMY    . CORONARY STENT PLACEMENT    . PR CATH PLACE/CORON ANGIO, IMG SUPER/INTERP,W LEFT HEART VENTRICULOGRAPHY N/A 08/21/2015   Procedure: Left Heart Catheterization W Intervention;  Surgeon: Zachary Prentice Car, MD;  Location: [facility] CATH;  Service: Cardiology  . PR CATH PLACE/CORON ANGIO, IMG SUPER/INTERP,W LEFT HEART VENTRICULOGRAPHY N/A 06/09/2018   Procedure: Left Heart Catheterization Coronary Angiography Bypass graft angiography;  Surgeon: Donnice Beverley Chester, MD;  Location: [facility] CATH;  Service: Cardiology  . PR CATH PLACE/CORON ANGIO, IMG SUPER/INTERP,W LEFT HEART VENTRICULOGRAPHY N/A 08/17/2023   Procedure: Left Heart Catheterization;  Surgeon: Gil [name] Prentice, MD;  Location: [facility] CATH;  Service: Cardiology  . TONSILLECTOMY AND ADENOIDECTOMY      Social History:  reports that he quit smoking about 11 years ago. His smoking use included cigarettes. He started smoking about 61 years ago. He has a 50 pack-year smoking history. He has never used smokeless tobacco. He reports that he does not currently use drugs. He reports that he does not drink alcohol. Previous smoker   Family History: family history includes COPD in his father and mother; Cirrhosis in his father; Heart attack in his maternal grandfather and paternal grandfather; Heart disease in his father, maternal grandfather, mother, and paternal grandfather; Hypertension in his father; Parkinsonism in  his brother and paternal grandfather.  Review of Systems:  Except as noted in the HPI, the remainder of 10 systems reviewed is negative.   Allergies: Allergies  Allergen Reactions  . Meperidine Hives and Other (See Comments)  . Prednisone  Other (See Comments)    Pt states that this medication puts him in A-fib   Pt states that this medication puts him in A-fib  . Sulfasalazine Hives  . Sulfa (Sulfonamide Antibiotics) Other (See Comments)    Other reaction(s): HIVES  . Albuterol  Other (See Comments) and Palpitations    Pt currently uses this medication.    . Albuterol  Sulfate Other (See Comments) and Palpitations    Speeds heart rate up, but Xopenex  wasn't effective so MD changed to Albuterol  even though it causes palpitations. Pt still currently uses this medication.   Speeds heart rate up, but Xopenex  wasn't effective so MD changed to Albuterol  even though it causes palpitations.  . Empagliflozin  Hives and Other (See Comments)    Perineal pain  Perineal pain    Other Reaction(s): Other (See Comments)  .  Morphine  Itching and Other (See Comments)    Medications:  Prior to Admission medications  Medication Dose, Route, Frequency  allopurinoL  (ZYLOPRIM ) 300 MG tablet 300 mg, 2 times a day (standard)  ALPRAZolam  (XANAX ) 1 MG tablet 1 mg, 3 times a day PRN  budesonide -glycopyr-formoterol  (BREZTRI  AEROSPHERE) 160-9-4.8 mcg/actuation HFAA 1 puff, Inhalation, Once Patient taking differently: Inhale 2 puffs once.  cetirizine  (ZYRTEC ) 10 MG tablet 10 mg, Nightly  cholecalciferol , vitamin D3, (VITAMIN D3) 125 mcg (5,000 unit) tablet 125 mcg, Daily (standard)  dapagliflozin  (FARXIGA ) 10 mg Tab tablet 10 mg, Every morning  gabapentin  (NEURONTIN ) 300 MG capsule 300 mg, Oral, 2 times a day (standard)  lidocaine  (ASPERCREME) 4 % patch 1 patch, Transdermal, Daily (standard)  lithium 150 MG capsule 150 mg, Nightly  metFORMIN  (GLUCOPHAGE -XR) 500 MG 24 hr tablet 500 mg, Oral, Daily before  breakfast  methocarbamoL  (ROBAXIN ) 500 MG tablet 500 mg, Every 8 hours PRN  nitroglycerin  (NITROSTAT ) 0.4 MG SL tablet 0.4 mg, Every 5 min PRN  omega-3 acid ethyl esters (LOVAZA ) 1 gram capsule 4 g, Daily (standard)  oxyCODONE -acetaminophen  (PERCOCET) 10-325 mg per tablet 1 tablet, Every 4 hours PRN  OXYGEN -AIR DELIVERY SYSTEMS MISC 2 L, Nightly  pantoprazole  (PROTONIX ) 40 MG tablet 40 mg, Oral, Daily before breakfast  ranolazine  (RANEXA ) 1,000 mg SR tablet 1,000 mg, Oral, 2 times a day (standard)  sacubitril -valsartan  (ENTRESTO ) 24-26 mg tablet 1 tablet, Oral, 2 times a day (standard)  spironolactone  (ALDACTONE ) 25 MG tablet 25 mg, Oral, Daily (standard)  traZODone  (DESYREL ) 100 MG tablet 100 mg, Nightly  venlafaxine  (EFFEXOR -XR) 75 MG 24 hr capsule 75 mg, Daily  vortioxetine (TRINTELLIX) 10 mg tablet 10 mg, Nightly  amlodipine  (NORVASC ) 10 MG tablet 10 mg, Oral, Daily (standard)  apixaban  (ELIQUIS ) 5 mg Tab 5 mg, Oral, 2 times a day (standard)  atorvastatin  (LIPITOR ) 80 MG tablet 80 mg, Oral, Every evening  carvedilol  (COREG ) 3.125 MG tablet 3.125 mg, Oral, 2 times a day (standard)  isosorbide  mononitrate (IMDUR ) 60 MG 24 hr tablet 60 mg, Oral, Daily (standard)     Objective:   Vitals BP 117/76 (BP Site: L Arm, BP Position: Sitting, BP Cuff Size: Medium)   Pulse 81   Wt 80.7 kg (177 lb 14.4 oz)   SpO2 96%   BMI 27.86 kg/m    Wt Readings from Last 3 Encounters:  05/12/24 80.7 kg (177 lb 14.4 oz)  03/01/24 75.8 kg (167 lb)  02/22/24 79.8 kg (176 lb)    Physical Exam General:  Pleasant male sitting in chair in nad.  Neck: Supple, JVP normal.  Resp:   CTAB bilaterally with normal WOB.  Cardio:  Irregularly irregular.  No significant murmurs rubs or gallops  Abdomen:   Soft, non-distended, non-tender.  Extremities: Warm well-perfused bilaterally. No edema bilaterally.  MSK: No joint swelling or erythema. No gross deformities.  Skin: No rashes  Neuro: CN II-XII grossly  intact. Strength grossly intact.   Psych: Alert and oriented x3. Appropriate mood.    ECG (05/12/24) - independently interpreted most recent ECG in EPIC.  Today - Atrial fibrillation  Most Recent Labs  Lab Results  Component Value Date   NA 142 03/01/2024   K 3.9 03/01/2024   CL 101 03/01/2024   CO2 29.2 03/01/2024   Lab Results  Component Value Date   BUN 14 03/01/2024   BUN 10 02/22/2024   BUN 18 02/02/2015   BUN 14 09/17/2013   Lab Results  Component Value Date   Creatinine 0.88  03/01/2024   Creatinine 0.83 02/22/2024   Creatinine 0.86 02/02/2015   Creatinine 0.93 09/17/2013   Lab Results  Component Value Date   PRO-BNP 1,181.0 (H) 01/31/2024   PRO-BNP 1,716.0 (H) 01/18/2024   PRO-BNP 142 09/16/2013   PRO-BNP 221 (H) 01/21/2013   Lab Results  Component Value Date   Cholesterol, Total 98 01/31/2024   Cholesterol, Total 91 (L) 01/22/2013   Triglycerides 116 01/31/2024   Triglycerides 95 01/22/2013   HDL 27 (L) 01/22/2013   Cholesterol, HDL 20 (L) 01/31/2024   Cholesterol, Non-HDL, Calculated 78 01/31/2024   LDL Direct 45 09/15/2012   LDL Cholesterol, Calculated 45 01/22/2013   Cholesterol, LDL, Calculated 55 01/31/2024

## 2024-05-15 NOTE — Telephone Encounter (Signed)
 [employer] [facility] is calling in to check the status of the nebulizer order. They are requesting updated clinical notes from the last 90 days. They received some notes, but they were out of the time frame. Lawrence [name] says you can follow up with her at [phone]

## 2024-05-16 ENCOUNTER — Telehealth: Payer: Self-pay

## 2024-05-16 NOTE — Telephone Encounter (Signed)
 Copied from CRM [id number]. Topic: General - Other >> May 16, 2024  9:18 AM Myrick T wrote: Reason for CRM: Renee from [facility] called stated they need justification for the nebulizer in the notes. It can be sent to fax#[phone]

## 2024-05-16 NOTE — Telephone Encounter (Signed)
 Notes were faxed Friday to the fax number provided. Dr.[name] is not sure why a nebulizer is being requested? I have updated the information with [employer] [facility].

## 2024-05-17 ENCOUNTER — Telehealth: Payer: Self-pay

## 2024-05-18 ENCOUNTER — Other Ambulatory Visit: Payer: Self-pay

## 2024-05-18 ENCOUNTER — Encounter: Payer: Self-pay | Admitting: *Deleted

## 2024-05-18 ENCOUNTER — Emergency Department

## 2024-05-18 ENCOUNTER — Emergency Department
Admission: EM | Admit: 2024-05-18 | Discharge: 2024-05-19 | Disposition: A | Discharge status: Home / Self Care | Attending: Emergency Medicine | Admitting: Emergency Medicine

## 2024-05-18 DIAGNOSIS — E119 Type 2 diabetes mellitus without complications: Secondary | ICD-10-CM | POA: Insufficient documentation

## 2024-05-18 DIAGNOSIS — M79661 Pain in right lower leg: Secondary | ICD-10-CM | POA: Insufficient documentation

## 2024-05-18 DIAGNOSIS — J189 Pneumonia, unspecified organism: Secondary | ICD-10-CM | POA: Insufficient documentation

## 2024-05-18 DIAGNOSIS — Z7901 Long term (current) use of anticoagulants: Secondary | ICD-10-CM | POA: Diagnosis not present

## 2024-05-18 DIAGNOSIS — M79604 Pain in right leg: Secondary | ICD-10-CM

## 2024-05-18 DIAGNOSIS — R079 Chest pain, unspecified: Secondary | ICD-10-CM

## 2024-05-18 DIAGNOSIS — R051 Acute cough: Secondary | ICD-10-CM

## 2024-05-18 DIAGNOSIS — I2510 Atherosclerotic heart disease of native coronary artery without angina pectoris: Secondary | ICD-10-CM | POA: Insufficient documentation

## 2024-05-18 DIAGNOSIS — R0602 Shortness of breath: Secondary | ICD-10-CM

## 2024-05-18 DIAGNOSIS — J449 Chronic obstructive pulmonary disease, unspecified: Secondary | ICD-10-CM | POA: Diagnosis not present

## 2024-05-18 DIAGNOSIS — I509 Heart failure, unspecified: Secondary | ICD-10-CM | POA: Diagnosis not present

## 2024-05-18 MED ORDER — ACETAMINOPHEN 500 MG PO TABS
1000.0000 mg | ORAL_TABLET | Freq: Once | ORAL | Status: AC
  Administered 2024-05-19: 1000 mg via ORAL
  Filled 2024-05-18: qty 2

## 2024-05-18 NOTE — ED Triage Notes (Addendum)
 Pt brought in via ems from home.  Iv in place  pt on 2 liters oxygen  at night.  Pt has right lower leg pain. No calf pain.  Pain located in shin area.  No swelling noted.   No known injury.   Pt alert

## 2024-05-18 NOTE — ED Provider Notes (Signed)
 Lawrence [name] Provider Note    Event Date/Time   First MD Initiated Contact with Patient 05/18/24 2328     (approximate)   History   Leg Pain   HPI  Lawrence [name] is a 68 y.o. male with history of A-fib on Eliquis , history of GERD, diabetes, CAD, CHF, COPD on 2 L at night, presenting with right shin pain.  He denies any new trauma or falls.  He does note some calf pain on the right, also with some chest pain, shortness of breath and a nonproductive cough.  He denies any fever.  States that he has been compliant with his Eliquis .  He denies any new weakness or numbness although he does state that his notes some tingling to his right shin.  No new back pain.  No his right calf new incontinence.  On independent chart review, he was seen by his cardiologist at the end of June, had multiple ED visits reported for chest pain with reassuring CTAs as well as troponins.  They think this is secondary to musculoskeletal versus GI.     Physical Exam   Triage Vital Signs: ED Triage Vitals [05/18/24 2211]  Encounter Vitals Group     BP 104/72     Girls Systolic BP Percentile      Girls Diastolic BP Percentile      Boys Systolic BP Percentile      Boys Diastolic BP Percentile      Pulse Rate 74     Resp 20     Temp (!) 97.5 F (36.4 C)     Temp Source Oral     SpO2 98 %     Weight 178 lb (80.7 kg)     Height 5' 7 (1.702 m)     Head Circumference      Peak Flow      Pain Score 9     Pain Loc      Pain Education      Exclude from Growth Chart     Most recent vital signs: Vitals:   05/18/24 2211  BP: 104/72  Pulse: 74  Resp: 20  Temp: (!) 97.5 F (36.4 C)  SpO2: 98%     General: Awake, no distress.  CV:  Good peripheral perfusion.  Resp:  Normal effort.  No tachypnea or respiratory distress, clear Abd:  No distention.  Soft nontender Other:  Right lower extremity is slightly larger than the left, no saddle anesthesia, no focal weakness or  numbness, he has palpable DP pulses bilaterally, there is no discoloration to his right lower extremity compared to the left , No open wounds to the shin, no erythema, fluctuance or induration  ED Results / Procedures / Treatments   Labs (all labs ordered are listed, but only abnormal results are displayed) Labs Reviewed  COMPREHENSIVE METABOLIC PANEL WITH GFR - Abnormal; Notable for the following components:      Result Value   Glucose, Bld 123 (*)    BUN 33 (*)    Total Protein 5.9 (*)    All other components within normal limits  CBC WITH DIFFERENTIAL/PLATELET - Abnormal; Notable for the following components:   RDW 19.0 (*)    Platelets 140 (*)    All other components within normal limits  PROTIME-INR - Abnormal; Notable for the following components:   Prothrombin Time 16.1 (*)    All other components within normal limits  BRAIN NATRIURETIC PEPTIDE  TROPONIN I (HIGH SENSITIVITY)  TROPONIN I (HIGH SENSITIVITY)     EKG  EKG shows, atrial fibrillation, rate of 61, normal QRS, normal QTc, no obvious ischemic ST elevation, T wave flattening to inferior lateral leads, not significant for the prior   RADIOLOGY On my independent interpretation, CT angio without obvious PE   PROCEDURES:  Critical Care performed: No  Procedures   MEDICATIONS ORDERED IN ED: Medications  acetaminophen  (TYLENOL ) tablet 1,000 mg (1,000 mg Oral Given 05/19/24 0001)  iohexol  (OMNIPAQUE ) 350 MG/ML injection 75 mL (75 mLs Intravenous Contrast Given 05/19/24 0050)  amoxicillin -clavulanate (AUGMENTIN ) 875-125 MG per tablet 1 tablet (1 tablet Oral Given 05/19/24 0243)  doxycycline  (VIBRA -TABS) tablet 100 mg (100 mg Oral Given 05/19/24 0243)     IMPRESSION / MDM / ASSESSMENT AND PLAN / ED COURSE  I reviewed the triage vital signs and the nursing notes.                              Differential diagnosis includes, but is not limited to, musculoskeletal pain, strain, fracture.  DVT, PE, ACS, angina, viral  illness, pneumonia, COPD, CHF.  X-ray was done at triage, will add on DVT ultrasound, CT PE study, labs, EKG, troponin, chest x-ray.  Tylenol  to start and reassess.  Patient's presentation is most consistent with acute illness / injury with system symptoms.  Independent interpretation of labs and imaging below.  On reassessment patient is stable on his home O2, discussed with him about imaging and lab results including incidental findings, given that the CT showed bronchial thickening, compatible with possible airway infection and given that he has a new cough, will treat for pneumonia with Augmentin  and doxycycline .  I believe that he is safe for outpatient management.  His leg pain is possible musculoskeletal strain.  Shared decision making to patient and he is agreeable plan for discharge, I will send his antibiotics to the pharmacy, instructed him to follow his primary care doctor next week to get reassessed.  Strict return precautions given.  Discharge.    Clinical Course as of 05/19/24 0342  Thu May 18, 2024  2338 DG Tibia/Fibula Right No acute fracture or dislocation.  [TT]  Fri May 19, 2024  0026 DG Chest 1 View 1. Bronchial wall thickening suspicious for bronchitis.  [TT]  0107 US  Venous Img Lower Unilateral Right No evidence of deep venous thrombosis of the RIGHT lower extremity.  [TT]  0157 CT Angio Chest PE W/Cm &/Or Wo Cm IMPRESSION: 1. Negative for pulmonary embolism. 2. Bronchial wall thickening and scattered centrilobular micro nodules compatible with airway infection/inflammation. 3. Advanced coronary artery calcification. 4. Aortic Atherosclerosis (ICD10-I70.0).   [TT]  9660 Troponin I (High Sensitivity) Stable [TT]    Clinical Course User Index [TT] [name], MD     FINAL CLINICAL IMPRESSION(S) / ED DIAGNOSES   Final diagnoses:  Right leg pain  Chest pain, unspecified type  Shortness of breath  Acute cough  Pneumonia due to infectious organism,  unspecified laterality, unspecified part of lung     Rx / DC Orders   ED Discharge Orders          Ordered    amoxicillin -clavulanate (AUGMENTIN ) 875-125 MG tablet  2 times daily        05/19/24 0248    doxycycline  (VIBRA -TABS) 100 MG tablet  2 times daily        05/19/24 0248  Note:  This document was prepared using Dragon voice recognition software and may include unintentional dictation errors.    [name], MD 05/19/24 [phone]

## 2024-05-19 ENCOUNTER — Emergency Department

## 2024-05-19 DIAGNOSIS — M79661 Pain in right lower leg: Secondary | ICD-10-CM | POA: Diagnosis not present

## 2024-05-19 LAB — CBC WITH DIFFERENTIAL/PLATELET
Abs Immature Granulocytes: 0.06 K/uL (ref 0.00–0.07)
Basophils Absolute: 0 K/uL (ref 0.0–0.1)
Basophils Relative: 0 %
Eosinophils Absolute: 0.2 K/uL (ref 0.0–0.5)
Eosinophils Relative: 2 %
HCT: 47 % (ref 39.0–52.0)
Hemoglobin: 14.8 g/dL (ref 13.0–17.0)
Immature Granulocytes: 1 %
Lymphocytes Relative: 23 %
Lymphs Abs: 1.6 K/uL (ref 0.7–4.0)
MCH: 28.7 pg (ref 26.0–34.0)
MCHC: 31.5 g/dL (ref 30.0–36.0)
MCV: 91.1 fL (ref 80.0–100.0)
Monocytes Absolute: 0.6 K/uL (ref 0.1–1.0)
Monocytes Relative: 9 %
Neutro Abs: 4.5 K/uL (ref 1.7–7.7)
Neutrophils Relative %: 65 %
Platelets: 140 K/uL — ABNORMAL LOW (ref 150–400)
RBC: 5.16 MIL/uL (ref 4.22–5.81)
RDW: 19 % — ABNORMAL HIGH (ref 11.5–15.5)
WBC: 6.9 K/uL (ref 4.0–10.5)
nRBC: 0 % (ref 0.0–0.2)

## 2024-05-19 LAB — COMPREHENSIVE METABOLIC PANEL WITH GFR
ALT: 20 U/L (ref 0–44)
AST: 19 U/L (ref 15–41)
Albumin: 3.5 g/dL (ref 3.5–5.0)
Alkaline Phosphatase: 60 U/L (ref 38–126)
Anion gap: 6 (ref 5–15)
BUN: 33 mg/dL — ABNORMAL HIGH (ref 8–23)
CO2: 26 mmol/L (ref 22–32)
Calcium: 9.5 mg/dL (ref 8.9–10.3)
Chloride: 106 mmol/L (ref 98–111)
Creatinine, Ser: 1.23 mg/dL (ref 0.61–1.24)
GFR, Estimated: >60 mL/min (ref >60)
Glucose, Bld: 123 mg/dL — ABNORMAL HIGH (ref 70–99)
Potassium: 4.1 mmol/L (ref 3.5–5.1)
Sodium: 138 mmol/L (ref 135–145)
Total Bilirubin: 0.2 mg/dL (ref 0.0–1.2)
Total Protein: 5.9 g/dL — ABNORMAL LOW (ref 6.5–8.1)

## 2024-05-19 LAB — TROPONIN I (HIGH SENSITIVITY)
Troponin I (High Sensitivity): 11 ng/L (ref <18)
Troponin I (High Sensitivity): 9 ng/L (ref <18)

## 2024-05-19 LAB — PROTIME-INR
INR: 1.2 (ref 0.8–1.2)
Prothrombin Time: 16.1 s — ABNORMAL HIGH (ref 11.4–15.2)

## 2024-05-19 LAB — BRAIN NATRIURETIC PEPTIDE: B Natriuretic Peptide: 230.6 pg/mL — ABNORMAL HIGH (ref 0.0–100.0)

## 2024-05-19 MED ORDER — AMOXICILLIN-POT CLAVULANATE 875-125 MG PO TABS
1.0000 | ORAL_TABLET | Freq: Once | ORAL | Status: AC
  Administered 2024-05-19: 1 via ORAL
  Filled 2024-05-19: qty 1

## 2024-05-19 MED ORDER — DOXYCYCLINE HYCLATE 100 MG PO TABS
100.0000 mg | ORAL_TABLET | Freq: Once | ORAL | Status: AC
  Administered 2024-05-19: 100 mg via ORAL
  Filled 2024-05-19: qty 1

## 2024-05-19 MED ORDER — DOXYCYCLINE HYCLATE 100 MG PO TABS: 100.0000 mg | ORAL_TABLET | Freq: Two times a day (BID) | ORAL | 0 refills | Status: DC

## 2024-05-19 MED ORDER — AMOXICILLIN-POT CLAVULANATE 875-125 MG PO TABS: 1.0000 | ORAL_TABLET | Freq: Two times a day (BID) | ORAL | 0 refills | Status: DC

## 2024-05-19 MED ORDER — IOHEXOL 350 MG/ML SOLN
75.0000 mL | Freq: Once | INTRAVENOUS | Status: AC | PRN
  Administered 2024-05-19: 75 mL via INTRAVENOUS

## 2024-05-19 NOTE — ED Notes (Signed)
 Provider Waymond, MD notified of pt request for pain medication

## 2024-05-19 NOTE — Discharge Instructions (Addendum)
 Please do the antibiotics as prescribed for your pneumonia.

## 2024-05-22 ENCOUNTER — Other Ambulatory Visit: Payer: Self-pay | Admitting: Family Medicine

## 2024-05-22 DIAGNOSIS — M792 Neuralgia and neuritis, unspecified: Secondary | ICD-10-CM

## 2024-05-22 LAB — HM DIABETES EYE EXAM

## 2024-05-24 ENCOUNTER — Telehealth: Payer: Self-pay | Admitting: Family Medicine

## 2024-05-24 ENCOUNTER — Other Ambulatory Visit: Payer: Self-pay | Admitting: Family Medicine

## 2024-05-24 NOTE — Telephone Encounter (Signed)
 Copied from CRM [id number]. Topic: Clinical - Medication Refill >> May 24, 2024  1:32 PM Zebedee SAUNDERS wrote: Medication: ALPRAZolam  (XANAX ) 1 MG tablet  Has the patient contacted their pharmacy? Yes (Agent: If no, request that the patient contact the pharmacy for the refill. If patient does not wish to contact the pharmacy document the reason why and proceed with request.) (Agent: If yes, when and what did the pharmacy advise?)Pharmacy needs PCP Approval.   This is the patient's preferred pharmacy:  [facility] - [place], [place] - [address] [place] [address] Phone: [phone] Fax: [phone]  Is this the correct pharmacy for this prescription? Yes If no, delete pharmacy and type the correct one.   Has the prescription been filled recently? Yes  Is the patient out of the medication? Yes  Has the patient been seen for an appointment in the last year OR does the patient have an upcoming appointment? Yes  Can we respond through MyChart? Yes  Agent: Please be advised that Rx refills may take up to 3 business days. We ask that you follow-up with your pharmacy.

## 2024-05-26 ENCOUNTER — Ambulatory Visit: Admitting: Family Medicine

## 2024-05-26 ENCOUNTER — Encounter: Payer: Self-pay | Admitting: Family Medicine

## 2024-05-26 VITALS — BP 76/50 | HR 56 | Temp 98.0°F | Resp 16 | Wt 192.0 lb

## 2024-05-26 DIAGNOSIS — J189 Pneumonia, unspecified organism: Secondary | ICD-10-CM | POA: Diagnosis not present

## 2024-05-26 DIAGNOSIS — J410 Simple chronic bronchitis: Secondary | ICD-10-CM

## 2024-05-26 DIAGNOSIS — J42 Unspecified chronic bronchitis: Secondary | ICD-10-CM

## 2024-05-26 DIAGNOSIS — L309 Dermatitis, unspecified: Secondary | ICD-10-CM | POA: Diagnosis not present

## 2024-05-26 DIAGNOSIS — J441 Chronic obstructive pulmonary disease with (acute) exacerbation: Secondary | ICD-10-CM | POA: Diagnosis not present

## 2024-05-26 MED ORDER — ALBUTEROL SULFATE (2.5 MG/3ML) 0.083% IN NEBU: 2.5000 mg | INHALATION_SOLUTION | Freq: Four times a day (QID) | RESPIRATORY_TRACT | 2 refills | Status: AC | PRN

## 2024-05-26 MED ORDER — METRONIDAZOLE 1 % EX GEL: Freq: Every day | CUTANEOUS | 3 refills | Status: DC

## 2024-05-26 NOTE — Telephone Encounter (Signed)
 Requested medications are due for refill today.  Seems too soon - pt is requesting refill  Requested medications are on the active medications list.  yes  Last refill. 03/29/2024 #90 3 rf  Future visit scheduled.   yes  Notes to clinic.  Refill not delegated.    Requested Prescriptions  Pending Prescriptions Disp Refills   ALPRAZolam  (XANAX ) 1 MG tablet 90 tablet 3    Sig: Take 1 tablet (1 mg total) by mouth 3 (three) times daily as needed.     Not Delegated - Psychiatry: Anxiolytics/Hypnotics 2 Failed - 05/26/2024 11:48 AM      Failed - This refill cannot be delegated      Passed - Urine Drug Screen completed in last 360 days      Passed - Patient is not pregnant      Passed - Valid encounter within last 6 months    Recent Outpatient Visits           1 month ago OSA (obstructive sleep apnea)   [facility] [name], MD   2 months ago Depression, major, single episode, in partial remission [facility])   [place] [facility] [name], MD   4 months ago Neuropathic pain of chest   [facility] [name], MD   4 months ago Other chest pain   [facility] [name], MD

## 2024-05-27 ENCOUNTER — Other Ambulatory Visit: Payer: Self-pay

## 2024-05-27 ENCOUNTER — Emergency Department

## 2024-05-27 ENCOUNTER — Emergency Department
Admission: EM | Admit: 2024-05-27 | Discharge: 2024-05-27 | Disposition: A | Discharge status: Home / Self Care | Source: Home / Self Care | Attending: Emergency Medicine | Admitting: Emergency Medicine

## 2024-05-27 DIAGNOSIS — J441 Chronic obstructive pulmonary disease with (acute) exacerbation: Secondary | ICD-10-CM | POA: Diagnosis not present

## 2024-05-27 DIAGNOSIS — R079 Chest pain, unspecified: Secondary | ICD-10-CM | POA: Diagnosis not present

## 2024-05-27 DIAGNOSIS — W1809XA Striking against other object with subsequent fall, initial encounter: Secondary | ICD-10-CM | POA: Insufficient documentation

## 2024-05-27 DIAGNOSIS — I509 Heart failure, unspecified: Secondary | ICD-10-CM | POA: Insufficient documentation

## 2024-05-27 DIAGNOSIS — S0031XA Abrasion of nose, initial encounter: Secondary | ICD-10-CM | POA: Insufficient documentation

## 2024-05-27 DIAGNOSIS — R0789 Other chest pain: Secondary | ICD-10-CM | POA: Insufficient documentation

## 2024-05-27 DIAGNOSIS — M25511 Pain in right shoulder: Secondary | ICD-10-CM | POA: Insufficient documentation

## 2024-05-27 DIAGNOSIS — W19XXXA Unspecified fall, initial encounter: Secondary | ICD-10-CM

## 2024-05-27 DIAGNOSIS — Z951 Presence of aortocoronary bypass graft: Secondary | ICD-10-CM | POA: Insufficient documentation

## 2024-05-27 DIAGNOSIS — I2510 Atherosclerotic heart disease of native coronary artery without angina pectoris: Secondary | ICD-10-CM | POA: Insufficient documentation

## 2024-05-27 DIAGNOSIS — Z23 Encounter for immunization: Secondary | ICD-10-CM | POA: Insufficient documentation

## 2024-05-27 LAB — CBC
HCT: 51 % (ref 39.0–52.0)
Hemoglobin: 15.9 g/dL (ref 13.0–17.0)
MCH: 27.9 pg (ref 26.0–34.0)
MCHC: 31.2 g/dL (ref 30.0–36.0)
MCV: 89.6 fL (ref 80.0–100.0)
Platelets: 120 K/uL — ABNORMAL LOW (ref 150–400)
RBC: 5.69 MIL/uL (ref 4.22–5.81)
RDW: 18.6 % — ABNORMAL HIGH (ref 11.5–15.5)
WBC: 10.2 K/uL (ref 4.0–10.5)
nRBC: 0 % (ref 0.0–0.2)

## 2024-05-27 LAB — HEPATIC FUNCTION PANEL
ALT: 21 U/L (ref 0–44)
AST: 24 U/L (ref 15–41)
Albumin: 3.6 g/dL (ref 3.5–5.0)
Alkaline Phosphatase: 53 U/L (ref 38–126)
Bilirubin, Direct: 0.3 mg/dL — ABNORMAL HIGH (ref 0.0–0.2)
Indirect Bilirubin: 1.2 mg/dL — ABNORMAL HIGH (ref 0.3–0.9)
Total Bilirubin: 1.5 mg/dL — ABNORMAL HIGH (ref 0.0–1.2)
Total Protein: 6.5 g/dL (ref 6.5–8.1)

## 2024-05-27 LAB — TROPONIN I (HIGH SENSITIVITY)
Troponin I (High Sensitivity): 18 ng/L — ABNORMAL HIGH (ref <18)
Troponin I (High Sensitivity): 19 ng/L — ABNORMAL HIGH (ref <18)

## 2024-05-27 LAB — BASIC METABOLIC PANEL WITH GFR
Anion gap: 9 (ref 5–15)
BUN: 22 mg/dL (ref 8–23)
CO2: 27 mmol/L (ref 22–32)
Calcium: 9.1 mg/dL (ref 8.9–10.3)
Chloride: 101 mmol/L (ref 98–111)
Creatinine, Ser: 1.18 mg/dL (ref 0.61–1.24)
GFR, Estimated: >60 mL/min (ref >60)
Glucose, Bld: 164 mg/dL — ABNORMAL HIGH (ref 70–99)
Potassium: 4.3 mmol/L (ref 3.5–5.1)
Sodium: 137 mmol/L (ref 135–145)

## 2024-05-27 LAB — CK: Total CK: 143 U/L (ref 49–397)

## 2024-05-27 LAB — URINALYSIS, ROUTINE W REFLEX MICROSCOPIC
Bacteria, UA: NONE SEEN
Bilirubin Urine: NEGATIVE
Glucose, UA: >=500 mg/dL — AB
Ketones, ur: NEGATIVE mg/dL
Leukocytes,Ua: NEGATIVE
Nitrite: NEGATIVE
Protein, ur: NEGATIVE mg/dL
Specific Gravity, Urine: 1.024 (ref 1.005–1.030)
pH: 5 (ref 5.0–8.0)

## 2024-05-27 LAB — BRAIN NATRIURETIC PEPTIDE: B Natriuretic Peptide: 412.1 pg/mL — ABNORMAL HIGH (ref 0.0–100.0)

## 2024-05-27 LAB — LIPASE, BLOOD: Lipase: 36 U/L (ref 11–51)

## 2024-05-27 MED ORDER — LIDOCAINE 5 % EX PTCH
1.0000 | MEDICATED_PATCH | CUTANEOUS | Status: DC
  Administered 2024-05-27: 1 via TRANSDERMAL
  Filled 2024-05-27: qty 1

## 2024-05-27 MED ORDER — IOHEXOL 350 MG/ML SOLN
100.0000 mL | Freq: Once | INTRAVENOUS | Status: AC | PRN
  Administered 2024-05-27: 100 mL via INTRAVENOUS

## 2024-05-27 MED ORDER — HYDROMORPHONE HCL 1 MG/ML IJ SOLN
1.0000 mg | Freq: Once | INTRAMUSCULAR | Status: AC
  Administered 2024-05-27: 1 mg via INTRAVENOUS
  Filled 2024-05-27: qty 1

## 2024-05-27 MED ORDER — TETANUS-DIPHTH-ACELL PERTUSSIS 5-2.5-18.5 LF-MCG/0.5 IM SUSY
0.5000 mL | PREFILLED_SYRINGE | Freq: Once | INTRAMUSCULAR | Status: AC
  Administered 2024-05-27: 0.5 mL via INTRAMUSCULAR
  Filled 2024-05-27: qty 0.5

## 2024-05-27 MED ORDER — ONDANSETRON HCL 4 MG/2ML IJ SOLN
4.0000 mg | Freq: Once | INTRAMUSCULAR | Status: AC
  Administered 2024-05-27: 4 mg via INTRAVENOUS
  Filled 2024-05-27: qty 2

## 2024-05-27 MED ORDER — AMOXICILLIN-POT CLAVULANATE 875-125 MG PO TABS: 1.0000 | ORAL_TABLET | Freq: Two times a day (BID) | ORAL | 0 refills | Status: DC

## 2024-05-27 MED ORDER — AMOXICILLIN-POT CLAVULANATE 875-125 MG PO TABS
1.0000 | ORAL_TABLET | Freq: Once | ORAL | Status: AC
  Administered 2024-05-27: 1 via ORAL
  Filled 2024-05-27: qty 1

## 2024-05-27 NOTE — ED Triage Notes (Signed)
 Patient coming EMS from home for CP after fall last night. Patient with Hx of afib, MI, DM, dementia. Patient on eliquis , lasix  prn, and oxygen  prn. EMS vitasl: 136/92, 96% O2 on RA, 9 of 10 CP. Patient given 324 mg asa in transport

## 2024-05-27 NOTE — ED Provider Notes (Signed)
 [facility] Provider Note    Event Date/Time   First MD Initiated Contact with Patient 05/27/24 1357     (approximate)   History   Chest Pain   HPI  Lawrence [name] is a 68 y.o. male  atrial fibrillation on Eliquis , CHF, CAD status post CABG who comes in with chest pain.  Patient reports having a fall last night.  He states that he was trying to get from his walker into his wheelchair and when he went to go sit down and the chair came out from underneath him.  He reports falling down on his butt and hitting his head he is on a blood thinner.  He reports chest pain as well as pain radiating from his chest into his abdomen.  He states that this all started after the fall.  He did have a low blood pressure when he was seen yesterday for dermatitis of 76/50.  I do not see anything in his notes to explain what happened during this visit other than he was treated for bronchitis, dermatitis.  Appears that patient is on gabapentin  300 twice daily for his chronic chest pain when I did review a note from 05/22/2024.  Patient also reports some right shoulder pain.  Patient already got aspirin  with EMS   Physical Exam   Triage Vital Signs: ED Triage Vitals  Encounter Vitals Group     BP      Girls Systolic BP Percentile      Girls Diastolic BP Percentile      Boys Systolic BP Percentile      Boys Diastolic BP Percentile      Pulse      Resp      Temp      Temp src      SpO2      Weight      Height      Head Circumference      Peak Flow      Pain Score      Pain Loc      Pain Education      Exclude from Growth Chart     Most recent vital signs: Vitals:   05/27/24 1356  BP: (!) 147/85  Pulse: 84  Resp: 17  Temp: 99 F (37.2 C)  SpO2: 94%     General: Awake, no distress.  CV:  Good peripheral perfusion.  Resp:  Normal effort.  Abd:  No distention.  Slight tenderness in the abdomen with palpation.  Some chest wall tenderness. Other:  No swelling in  legs able to lift both legs up off the bed.  Able to move both shoulders but does report some right shoulder pain. Abrasions noted on the nose- some facial tenderness.   ED Results / Procedures / Treatments   Labs (all labs ordered are listed, but only abnormal results are displayed) Labs Reviewed  BASIC METABOLIC PANEL WITH GFR  CBC  HEPATIC FUNCTION PANEL  LIPASE, BLOOD  BRAIN NATRIURETIC PEPTIDE  TROPONIN I (HIGH SENSITIVITY)     EKG  My interpretation of EKG:  Atrial fibrillation with a rate 97 without any ST elevation, T wave inversions in aVF V5 and V6, normal intervals  RADIOLOGY I reviewed the x-ray personally and interpretted and no signs of any dislocated shoulder or fracture.  PROCEDURES:  Critical Care performed: No  .1-3 Lead EKG Interpretation  Performed by: Ernest Ronal BRAVO, MD Authorized by: Ernest Ronal BRAVO, MD  Interpretation: normal     ECG rate:  80   ECG rate assessment: normal     Rhythm: atrial fibrillation     Ectopy: none     Conduction: normal      MEDICATIONS ORDERED IN ED: Medications  Tdap (BOOSTRIX) injection 0.5 mL (has no administration in time range)  HYDROmorphone  (DILAUDID ) injection 1 mg (has no administration in time range)  ondansetron  (ZOFRAN ) injection 4 mg (has no administration in time range)     IMPRESSION / MDM / ASSESSMENT AND PLAN / ED COURSE  I reviewed the triage vital signs and the nursing notes.   Patient's presentation is most consistent with acute presentation with potential threat to life or bodily function.   Patient comes in with his known A-fib but rate controlled currently with concerns for a fall yesterday.  Patient does have chronic chest pain but this seems to have happened after the fall yesterday.  Did noted to have low blood pressures yesterday and he is on a blood thinner therefore I think we should proceed with CT imaging to rule out any trauma from the fall, dissection, will get blood work to  evaluate for ACS.  Will treat patient symptomatically with Tdap, Dilaudid , Zofran .  2:40 PM reevaluated patient and updated patient that I would be leaving and that we are pending the CT scans and troponins that if they are reassuring that patient could be discharged home.  He expressed understanding and is requesting more for the pain.  He reports that pain medicine seem to help but he needs something more.  Will give 1 additional dose while pending workup  The patient is on the cardiac monitor to evaluate for evidence of arrhythmia and/or significant heart rate changes.      FINAL CLINICAL IMPRESSION(S) / ED DIAGNOSES   Final diagnoses:  Atypical chest pain  Fall, initial encounter     Rx / DC Orders   ED Discharge Orders     None        Note:  This document was prepared using Dragon voice recognition software and may include unintentional dictation errors.   Ernest Ronal BRAVO, MD 05/27/24 [phone]

## 2024-05-27 NOTE — ED Notes (Signed)
 Patient transported to CT

## 2024-05-27 NOTE — ED Notes (Signed)
 Informed by imaging tech that waiting on labs to result before CT can be performed

## 2024-05-27 NOTE — ED Notes (Signed)
 ED Provider at bedside.

## 2024-05-27 NOTE — ED Notes (Signed)
Patient transported to CT and X ray 

## 2024-05-27 NOTE — Discharge Instructions (Addendum)
 Your workup is reassuring I suspect is more likely related to your fall and bruising the muscles.  You should return to the ER if you develop worsening symptoms or any other concerns

## 2024-05-27 NOTE — ED Provider Notes (Signed)
 Emergency department handoff note  Care of this patient was signed out to me at the end of the previous provider shift.  All pertinent patient information was conveyed and all questions were answered.  Patient pending radiologic evaluation as well as repeat troponin.  Repeat troponin is only elevated by 1 from previous from 18-19.  I have low suspicion that the patient's chest pain is secondary to ACS.  However imaging has shown the possibility of a right-sided pneumonia.  Patient does endorse cough however denies any fever and patient has no leukocytosis on evaluation.  Despite minimal symptoms, patient does have cough and radiologic evidence of possible pneumonia and therefore we will start patient empirically on Augmentin  for the next 7 days with instructions to follow-up with his primary care physician if symptoms are worsening. The patient has been reexamined and is ready to be discharged.  All diagnostic results have been reviewed and discussed with the patient/family.  Care plan has been outlined and the patient/family understands all current diagnoses, results, and treatment plans.  There are no new complaints, changes, or physical findings at this time.  All questions have been addressed and answered.  Patient was instructed to, and agrees to follow-up with their primary care physician as well as return to the emergency department if any new or worsening symptoms develop.   Jossie Artist POUR, MD 05/27/24 [phone]

## 2024-05-29 ENCOUNTER — Emergency Department

## 2024-05-29 ENCOUNTER — Telehealth: Payer: Self-pay | Admitting: Family Medicine

## 2024-05-29 ENCOUNTER — Inpatient Hospital Stay
Admission: EM | Admit: 2024-05-29 | Discharge: 2024-06-01 | DRG: 190 | Disposition: A | Discharge status: Home / Self Care | Attending: Internal Medicine | Admitting: Internal Medicine

## 2024-05-29 DIAGNOSIS — J441 Chronic obstructive pulmonary disease with (acute) exacerbation: Principal | ICD-10-CM | POA: Diagnosis present

## 2024-05-29 DIAGNOSIS — Z8711 Personal history of peptic ulcer disease: Secondary | ICD-10-CM

## 2024-05-29 DIAGNOSIS — Z825 Family history of asthma and other chronic lower respiratory diseases: Secondary | ICD-10-CM

## 2024-05-29 DIAGNOSIS — Z885 Allergy status to narcotic agent status: Secondary | ICD-10-CM

## 2024-05-29 DIAGNOSIS — L309 Dermatitis, unspecified: Secondary | ICD-10-CM | POA: Diagnosis present

## 2024-05-29 DIAGNOSIS — J440 Chronic obstructive pulmonary disease with acute lower respiratory infection: Secondary | ICD-10-CM | POA: Diagnosis present

## 2024-05-29 DIAGNOSIS — Z66 Do not resuscitate: Secondary | ICD-10-CM | POA: Diagnosis present

## 2024-05-29 DIAGNOSIS — J189 Pneumonia, unspecified organism: Secondary | ICD-10-CM | POA: Diagnosis present

## 2024-05-29 DIAGNOSIS — I110 Hypertensive heart disease with heart failure: Secondary | ICD-10-CM | POA: Diagnosis present

## 2024-05-29 DIAGNOSIS — K219 Gastro-esophageal reflux disease without esophagitis: Secondary | ICD-10-CM | POA: Diagnosis present

## 2024-05-29 DIAGNOSIS — M51369 Other intervertebral disc degeneration, lumbar region without mention of lumbar back pain or lower extremity pain: Secondary | ICD-10-CM | POA: Diagnosis present

## 2024-05-29 DIAGNOSIS — Z9049 Acquired absence of other specified parts of digestive tract: Secondary | ICD-10-CM

## 2024-05-29 DIAGNOSIS — E1165 Type 2 diabetes mellitus with hyperglycemia: Secondary | ICD-10-CM | POA: Diagnosis present

## 2024-05-29 DIAGNOSIS — F1120 Opioid dependence, uncomplicated: Secondary | ICD-10-CM | POA: Diagnosis present

## 2024-05-29 DIAGNOSIS — Z79899 Other long term (current) drug therapy: Secondary | ICD-10-CM

## 2024-05-29 DIAGNOSIS — I252 Old myocardial infarction: Secondary | ICD-10-CM

## 2024-05-29 DIAGNOSIS — I5042 Chronic combined systolic (congestive) and diastolic (congestive) heart failure: Secondary | ICD-10-CM | POA: Diagnosis present

## 2024-05-29 DIAGNOSIS — I5022 Chronic systolic (congestive) heart failure: Secondary | ICD-10-CM | POA: Diagnosis present

## 2024-05-29 DIAGNOSIS — I2510 Atherosclerotic heart disease of native coronary artery without angina pectoris: Secondary | ICD-10-CM | POA: Diagnosis present

## 2024-05-29 DIAGNOSIS — Z7722 Contact with and (suspected) exposure to environmental tobacco smoke (acute) (chronic): Secondary | ICD-10-CM

## 2024-05-29 DIAGNOSIS — I340 Nonrheumatic mitral (valve) insufficiency: Secondary | ICD-10-CM | POA: Diagnosis present

## 2024-05-29 DIAGNOSIS — Z888 Allergy status to other drugs, medicaments and biological substances status: Secondary | ICD-10-CM

## 2024-05-29 DIAGNOSIS — Z833 Family history of diabetes mellitus: Secondary | ICD-10-CM

## 2024-05-29 DIAGNOSIS — Z882 Allergy status to sulfonamides status: Secondary | ICD-10-CM

## 2024-05-29 DIAGNOSIS — J9611 Chronic respiratory failure with hypoxia: Secondary | ICD-10-CM | POA: Diagnosis present

## 2024-05-29 DIAGNOSIS — Z9861 Coronary angioplasty status: Secondary | ICD-10-CM

## 2024-05-29 DIAGNOSIS — Z87442 Personal history of urinary calculi: Secondary | ICD-10-CM

## 2024-05-29 DIAGNOSIS — G4730 Sleep apnea, unspecified: Secondary | ICD-10-CM | POA: Diagnosis present

## 2024-05-29 DIAGNOSIS — Z818 Family history of other mental and behavioral disorders: Secondary | ICD-10-CM

## 2024-05-29 DIAGNOSIS — E119 Type 2 diabetes mellitus without complications: Principal | ICD-10-CM

## 2024-05-29 DIAGNOSIS — Z87891 Personal history of nicotine dependence: Secondary | ICD-10-CM

## 2024-05-29 DIAGNOSIS — Z820 Family history of epilepsy and other diseases of the nervous system: Secondary | ICD-10-CM

## 2024-05-29 DIAGNOSIS — K900 Celiac disease: Secondary | ICD-10-CM | POA: Diagnosis present

## 2024-05-29 DIAGNOSIS — Z7984 Long term (current) use of oral hypoglycemic drugs: Secondary | ICD-10-CM

## 2024-05-29 DIAGNOSIS — Z8379 Family history of other diseases of the digestive system: Secondary | ICD-10-CM

## 2024-05-29 DIAGNOSIS — I4891 Unspecified atrial fibrillation: Principal | ICD-10-CM | POA: Insufficient documentation

## 2024-05-29 DIAGNOSIS — M1990 Unspecified osteoarthritis, unspecified site: Secondary | ICD-10-CM | POA: Diagnosis present

## 2024-05-29 DIAGNOSIS — Z7951 Long term (current) use of inhaled steroids: Secondary | ICD-10-CM

## 2024-05-29 DIAGNOSIS — Z860101 Personal history of adenomatous and serrated colon polyps: Secondary | ICD-10-CM

## 2024-05-29 DIAGNOSIS — Z8249 Family history of ischemic heart disease and other diseases of the circulatory system: Secondary | ICD-10-CM

## 2024-05-29 DIAGNOSIS — Z951 Presence of aortocoronary bypass graft: Secondary | ICD-10-CM

## 2024-05-29 DIAGNOSIS — G8929 Other chronic pain: Secondary | ICD-10-CM | POA: Diagnosis present

## 2024-05-29 DIAGNOSIS — Z85828 Personal history of other malignant neoplasm of skin: Secondary | ICD-10-CM

## 2024-05-29 DIAGNOSIS — Z1152 Encounter for screening for COVID-19: Secondary | ICD-10-CM

## 2024-05-29 DIAGNOSIS — G4733 Obstructive sleep apnea (adult) (pediatric): Secondary | ICD-10-CM

## 2024-05-29 DIAGNOSIS — Z23 Encounter for immunization: Secondary | ICD-10-CM

## 2024-05-29 DIAGNOSIS — Z7901 Long term (current) use of anticoagulants: Secondary | ICD-10-CM

## 2024-05-29 DIAGNOSIS — K920 Hematemesis: Secondary | ICD-10-CM | POA: Diagnosis present

## 2024-05-29 DIAGNOSIS — I480 Paroxysmal atrial fibrillation: Secondary | ICD-10-CM | POA: Diagnosis not present

## 2024-05-29 DIAGNOSIS — K449 Diaphragmatic hernia without obstruction or gangrene: Secondary | ICD-10-CM | POA: Diagnosis present

## 2024-05-29 DIAGNOSIS — I482 Chronic atrial fibrillation, unspecified: Secondary | ICD-10-CM | POA: Diagnosis present

## 2024-05-29 DIAGNOSIS — M109 Gout, unspecified: Secondary | ICD-10-CM | POA: Diagnosis present

## 2024-05-29 LAB — URINALYSIS, ROUTINE W REFLEX MICROSCOPIC
Bacteria, UA: NONE SEEN
Bilirubin Urine: NEGATIVE
Glucose, UA: >=500 mg/dL — AB
Ketones, ur: 20 mg/dL — AB
Leukocytes,Ua: NEGATIVE
Nitrite: NEGATIVE
Protein, ur: NEGATIVE mg/dL
Specific Gravity, Urine: 1.013 (ref 1.005–1.030)
Squamous Epithelial / HPF: 0 /HPF (ref 0–5)
pH: 7 (ref 5.0–8.0)

## 2024-05-29 LAB — COMPREHENSIVE METABOLIC PANEL WITH GFR
ALT: 20 U/L (ref 0–44)
AST: 19 U/L (ref 15–41)
Albumin: 3.7 g/dL (ref 3.5–5.0)
Alkaline Phosphatase: 50 U/L (ref 38–126)
Anion gap: 12 (ref 5–15)
BUN: 15 mg/dL (ref 8–23)
CO2: 27 mmol/L (ref 22–32)
Calcium: 9.5 mg/dL (ref 8.9–10.3)
Chloride: 100 mmol/L (ref 98–111)
Creatinine, Ser: 0.99 mg/dL (ref 0.61–1.24)
GFR, Estimated: >60 mL/min (ref >60)
Glucose, Bld: 137 mg/dL — ABNORMAL HIGH (ref 70–99)
Potassium: 4.1 mmol/L (ref 3.5–5.1)
Sodium: 139 mmol/L (ref 135–145)
Total Bilirubin: 1.8 mg/dL — ABNORMAL HIGH (ref 0.0–1.2)
Total Protein: 6.5 g/dL (ref 6.5–8.1)

## 2024-05-29 LAB — CBC WITH DIFFERENTIAL/PLATELET
Abs Immature Granulocytes: 0.03 K/uL (ref 0.00–0.07)
Basophils Absolute: 0 K/uL (ref 0.0–0.1)
Basophils Relative: 0 %
Eosinophils Absolute: 0.1 K/uL (ref 0.0–0.5)
Eosinophils Relative: 1 %
HCT: 48.3 % (ref 39.0–52.0)
Hemoglobin: 15 g/dL (ref 13.0–17.0)
Immature Granulocytes: 0 %
Lymphocytes Relative: 7 %
Lymphs Abs: 0.6 K/uL — ABNORMAL LOW (ref 0.7–4.0)
MCH: 28 pg (ref 26.0–34.0)
MCHC: 31.1 g/dL (ref 30.0–36.0)
MCV: 90.3 fL (ref 80.0–100.0)
Monocytes Absolute: 0.6 K/uL (ref 0.1–1.0)
Monocytes Relative: 7 %
Neutro Abs: 7.4 K/uL (ref 1.7–7.7)
Neutrophils Relative %: 85 %
Platelets: 106 K/uL — ABNORMAL LOW (ref 150–400)
RBC: 5.35 MIL/uL (ref 4.22–5.81)
RDW: 19 % — ABNORMAL HIGH (ref 11.5–15.5)
WBC: 8.7 K/uL (ref 4.0–10.5)
nRBC: 0 % (ref 0.0–0.2)

## 2024-05-29 LAB — LACTIC ACID, PLASMA
Lactic Acid, Venous: 1.4 mmol/L (ref 0.5–1.9)
Lactic Acid, Venous: 1.6 mmol/L (ref 0.5–1.9)

## 2024-05-29 LAB — HEMOGLOBIN AND HEMATOCRIT, BLOOD
HCT: 43.9 % (ref 39.0–52.0)
Hemoglobin: 13.9 g/dL (ref 13.0–17.0)

## 2024-05-29 LAB — CBG MONITORING, ED: Glucose-Capillary: 160 mg/dL — ABNORMAL HIGH (ref 70–99)

## 2024-05-29 LAB — TROPONIN I (HIGH SENSITIVITY)
Troponin I (High Sensitivity): 12 ng/L (ref <18)
Troponin I (High Sensitivity): 11 ng/L (ref <18)

## 2024-05-29 LAB — GLUCOSE, CAPILLARY: Glucose-Capillary: 182 mg/dL — ABNORMAL HIGH (ref 70–99)

## 2024-05-29 LAB — MAGNESIUM: Magnesium: 1.9 mg/dL (ref 1.7–2.4)

## 2024-05-29 LAB — LIPASE, BLOOD: Lipase: 35 U/L (ref 11–51)

## 2024-05-29 MED ORDER — AMOXICILLIN-POT CLAVULANATE 875-125 MG PO TABS
1.0000 | ORAL_TABLET | Freq: Two times a day (BID) | ORAL | Status: DC
  Administered 2024-05-29: 1 via ORAL
  Filled 2024-05-29 (×2): qty 1

## 2024-05-29 MED ORDER — VORTIOXETINE HBR 5 MG PO TABS
10.0000 mg | ORAL_TABLET | Freq: Every day | ORAL | Status: DC
Start: 2024-05-29 — End: 2024-06-01
  Administered 2024-05-29 – 2024-06-01 (×4): 10 mg via ORAL
  Filled 2024-05-29 (×4): qty 2

## 2024-05-29 MED ORDER — CARVEDILOL 3.125 MG PO TABS
3.1250 mg | ORAL_TABLET | Freq: Two times a day (BID) | ORAL | Status: DC
  Administered 2024-05-29 – 2024-06-01 (×6): 3.125 mg via ORAL
  Filled 2024-05-29 (×6): qty 1

## 2024-05-29 MED ORDER — METOPROLOL TARTRATE 5 MG/5ML IV SOLN
5.0000 mg | INTRAVENOUS | Status: DC | PRN
  Administered 2024-05-29: 5 mg via INTRAVENOUS
  Filled 2024-05-29: qty 5

## 2024-05-29 MED ORDER — PANTOPRAZOLE SODIUM 40 MG IV SOLR
40.0000 mg | Freq: Once | INTRAVENOUS | Status: AC
  Administered 2024-05-29: 40 mg via INTRAVENOUS
  Filled 2024-05-29: qty 10

## 2024-05-29 MED ORDER — METHYLPREDNISOLONE SODIUM SUCC 125 MG IJ SOLR
125.0000 mg | Freq: Once | INTRAMUSCULAR | Status: AC
  Administered 2024-05-29: 125 mg via INTRAVENOUS
  Filled 2024-05-29: qty 2

## 2024-05-29 MED ORDER — LACTATED RINGERS IV BOLUS
1000.0000 mL | Freq: Once | INTRAVENOUS | Status: AC
  Administered 2024-05-29: 1000 mL via INTRAVENOUS

## 2024-05-29 MED ORDER — SUCRALFATE 1 GM/10ML PO SUSP
1.0000 g | Freq: Four times a day (QID) | ORAL | Status: DC
  Administered 2024-05-29 – 2024-06-01 (×11): 1 g via ORAL
  Filled 2024-05-29 (×14): qty 10

## 2024-05-29 MED ORDER — HYDROMORPHONE HCL 1 MG/ML IJ SOLN
0.5000 mg | Freq: Once | INTRAMUSCULAR | Status: AC
  Administered 2024-05-29: 0.5 mg via INTRAVENOUS
  Filled 2024-05-29: qty 0.5

## 2024-05-29 MED ORDER — SACUBITRIL-VALSARTAN 24-26 MG PO TABS
1.0000 | ORAL_TABLET | Freq: Two times a day (BID) | ORAL | Status: DC
  Administered 2024-05-30 – 2024-06-01 (×5): 1 via ORAL
  Filled 2024-05-29 (×5): qty 1

## 2024-05-29 MED ORDER — DILTIAZEM HCL 25 MG/5ML IV SOLN: 10.0000 mg | Freq: Once | INTRAVENOUS | Status: DC

## 2024-05-29 MED ORDER — GUAIFENESIN ER 600 MG PO TB12
1200.0000 mg | ORAL_TABLET | Freq: Two times a day (BID) | ORAL | Status: DC
  Administered 2024-05-29 – 2024-06-01 (×7): 1200 mg via ORAL
  Filled 2024-05-29 (×7): qty 2

## 2024-05-29 MED ORDER — TRAZODONE HCL 50 MG PO TABS
150.0000 mg | ORAL_TABLET | Freq: Every evening | ORAL | Status: DC | PRN
  Administered 2024-05-29 – 2024-05-31 (×3): 150 mg via ORAL
  Filled 2024-05-29 (×3): qty 1

## 2024-05-29 MED ORDER — TRAMADOL HCL 50 MG PO TABS
50.0000 mg | ORAL_TABLET | Freq: Two times a day (BID) | ORAL | Status: DC | PRN
  Administered 2024-05-29 – 2024-06-01 (×6): 50 mg via ORAL
  Filled 2024-05-29 (×6): qty 1

## 2024-05-29 MED ORDER — APIXABAN 5 MG PO TABS: 5.0000 mg | ORAL_TABLET | Freq: Two times a day (BID) | ORAL | Status: DC

## 2024-05-29 MED ORDER — FAMOTIDINE 20 MG PO TABS
20.0000 mg | ORAL_TABLET | Freq: Two times a day (BID) | ORAL | Status: DC
  Administered 2024-05-29 – 2024-06-01 (×6): 20 mg via ORAL
  Filled 2024-05-29 (×6): qty 1

## 2024-05-29 MED ORDER — ONDANSETRON HCL 4 MG PO TABS: 4.0000 mg | ORAL_TABLET | Freq: Four times a day (QID) | ORAL | Status: DC | PRN

## 2024-05-29 MED ORDER — LEVALBUTEROL HCL 0.63 MG/3ML IN NEBU
0.6300 mg | INHALATION_SOLUTION | Freq: Four times a day (QID) | RESPIRATORY_TRACT | Status: DC
  Administered 2024-05-29 – 2024-05-30 (×4): 0.63 mg via RESPIRATORY_TRACT
  Filled 2024-05-29 (×4): qty 3

## 2024-05-29 MED ORDER — METOCLOPRAMIDE HCL 10 MG PO TABS
10.0000 mg | ORAL_TABLET | Freq: Three times a day (TID) | ORAL | Status: DC
  Administered 2024-05-29 – 2024-06-01 (×11): 10 mg via ORAL
  Filled 2024-05-29 (×11): qty 1

## 2024-05-29 MED ORDER — ALUM & MAG HYDROXIDE-SIMETH 200-200-20 MG/5ML PO SUSP
30.0000 mL | Freq: Three times a day (TID) | ORAL | Status: DC
  Administered 2024-05-29 – 2024-06-01 (×11): 30 mL via ORAL
  Filled 2024-05-29 (×11): qty 30

## 2024-05-29 MED ORDER — LITHIUM CARBONATE 150 MG PO CAPS
150.0000 mg | ORAL_CAPSULE | Freq: Two times a day (BID) | ORAL | Status: DC
  Administered 2024-05-29 – 2024-06-01 (×6): 150 mg via ORAL
  Filled 2024-05-29 (×8): qty 1

## 2024-05-29 MED ORDER — ISOSORBIDE MONONITRATE ER 60 MG PO TB24
120.0000 mg | ORAL_TABLET | Freq: Every day | ORAL | Status: DC
  Administered 2024-05-30 – 2024-06-01 (×3): 120 mg via ORAL
  Filled 2024-05-29 (×3): qty 2

## 2024-05-29 MED ORDER — GABAPENTIN 300 MG PO CAPS
300.0000 mg | ORAL_CAPSULE | Freq: Two times a day (BID) | ORAL | Status: DC
  Administered 2024-05-29 – 2024-06-01 (×7): 300 mg via ORAL
  Filled 2024-05-29 (×7): qty 1

## 2024-05-29 MED ORDER — AMOXICILLIN-POT CLAVULANATE 875-125 MG PO TABS
1.0000 | ORAL_TABLET | Freq: Once | ORAL | Status: AC
  Administered 2024-05-29: 1 via ORAL
  Filled 2024-05-29: qty 1

## 2024-05-29 MED ORDER — AZITHROMYCIN 250 MG PO TABS
500.0000 mg | ORAL_TABLET | Freq: Every day | ORAL | Status: DC
  Administered 2024-05-29 – 2024-06-01 (×4): 500 mg via ORAL
  Filled 2024-05-29 (×3): qty 2
  Filled 2024-05-29: qty 1

## 2024-05-29 MED ORDER — ALBUTEROL SULFATE (2.5 MG/3ML) 0.083% IN NEBU: 3.0000 mL | INHALATION_SOLUTION | Freq: Four times a day (QID) | RESPIRATORY_TRACT | Status: DC | PRN

## 2024-05-29 MED ORDER — BUDESON-GLYCOPYRROL-FORMOTEROL 160-9-4.8 MCG/ACT IN AERO
2.0000 | INHALATION_SPRAY | Freq: Two times a day (BID) | RESPIRATORY_TRACT | Status: DC
  Administered 2024-05-29 – 2024-06-01 (×6): 2 via RESPIRATORY_TRACT
  Filled 2024-05-29 (×2): qty 5.9

## 2024-05-29 MED ORDER — ALLOPURINOL 100 MG PO TABS
300.0000 mg | ORAL_TABLET | Freq: Two times a day (BID) | ORAL | Status: DC
  Administered 2024-05-29 – 2024-06-01 (×6): 300 mg via ORAL
  Filled 2024-05-29 (×4): qty 3
  Filled 2024-05-29: qty 1
  Filled 2024-05-29 (×2): qty 3

## 2024-05-29 MED ORDER — IPRATROPIUM-ALBUTEROL 0.5-2.5 (3) MG/3ML IN SOLN
6.0000 mL | Freq: Once | RESPIRATORY_TRACT | Status: AC
  Administered 2024-05-29: 6 mL via RESPIRATORY_TRACT
  Filled 2024-05-29: qty 6

## 2024-05-29 MED ORDER — METHYLPREDNISOLONE SODIUM SUCC 40 MG IJ SOLR
40.0000 mg | Freq: Two times a day (BID) | INTRAMUSCULAR | Status: DC
  Administered 2024-05-29 – 2024-06-01 (×6): 40 mg via INTRAVENOUS
  Filled 2024-05-29 (×6): qty 1

## 2024-05-29 MED ORDER — MORPHINE SULFATE (PF) 4 MG/ML IV SOLN
6.0000 mg | Freq: Once | INTRAVENOUS | Status: AC
  Administered 2024-05-29: 6 mg via INTRAVENOUS
  Filled 2024-05-29: qty 2

## 2024-05-29 MED ORDER — ONDANSETRON HCL 4 MG/2ML IJ SOLN: 4.0000 mg | Freq: Four times a day (QID) | INTRAMUSCULAR | Status: DC | PRN

## 2024-05-29 MED ORDER — LEVALBUTEROL HCL 0.63 MG/3ML IN NEBU: 0.6300 mg | INHALATION_SOLUTION | Freq: Four times a day (QID) | RESPIRATORY_TRACT | Status: DC | PRN

## 2024-05-29 MED ORDER — INSULIN ASPART 100 UNIT/ML IJ SOLN: 0.0000 [IU] | Freq: Every day | INTRAMUSCULAR | Status: DC

## 2024-05-29 MED ORDER — PANTOPRAZOLE SODIUM 40 MG PO TBEC
40.0000 mg | DELAYED_RELEASE_TABLET | Freq: Every day | ORAL | Status: DC
  Administered 2024-05-29: 40 mg via ORAL
  Filled 2024-05-29 (×2): qty 1

## 2024-05-29 MED ORDER — METHOCARBAMOL 500 MG PO TABS
500.0000 mg | ORAL_TABLET | Freq: Three times a day (TID) | ORAL | Status: DC | PRN
  Administered 2024-05-30: 500 mg via ORAL
  Filled 2024-05-29: qty 1

## 2024-05-29 MED ORDER — DILTIAZEM HCL-DEXTROSE 125-5 MG/125ML-% IV SOLN (PREMIX)
5.0000 mg/h | INTRAVENOUS | Status: DC
  Administered 2024-05-29: 5 mg/h via INTRAVENOUS
  Filled 2024-05-29: qty 125

## 2024-05-29 MED ORDER — APIXABAN 5 MG PO TABS
5.0000 mg | ORAL_TABLET | Freq: Two times a day (BID) | ORAL | Status: DC
  Filled 2024-05-29: qty 1

## 2024-05-29 MED ORDER — ATORVASTATIN CALCIUM 80 MG PO TABS
80.0000 mg | ORAL_TABLET | Freq: Every day | ORAL | Status: DC
  Administered 2024-05-29 – 2024-05-31 (×3): 80 mg via ORAL
  Filled 2024-05-29 (×3): qty 1

## 2024-05-29 MED ORDER — SPIRONOLACTONE 25 MG PO TABS
25.0000 mg | ORAL_TABLET | Freq: Every day | ORAL | Status: DC
  Administered 2024-05-30 – 2024-06-01 (×3): 25 mg via ORAL
  Filled 2024-05-29 (×3): qty 1

## 2024-05-29 MED ORDER — IOHEXOL 300 MG/ML  SOLN
100.0000 mL | Freq: Once | INTRAMUSCULAR | Status: AC | PRN
  Administered 2024-05-29: 100 mL via INTRAVENOUS

## 2024-05-29 MED ORDER — ALPRAZOLAM 0.5 MG PO TABS
1.0000 mg | ORAL_TABLET | Freq: Three times a day (TID) | ORAL | Status: DC | PRN
  Administered 2024-05-29: 1 mg via ORAL
  Filled 2024-05-29: qty 2

## 2024-05-29 MED ORDER — LEVALBUTEROL HCL 0.63 MG/3ML IN NEBU: 0.6300 mg | INHALATION_SOLUTION | Freq: Four times a day (QID) | RESPIRATORY_TRACT | Status: DC

## 2024-05-29 MED ORDER — OXYCODONE HCL 5 MG PO TABS
5.0000 mg | ORAL_TABLET | Freq: Once | ORAL | Status: AC
  Administered 2024-05-29: 5 mg via ORAL
  Filled 2024-05-29: qty 1

## 2024-05-29 MED ORDER — ONDANSETRON HCL 4 MG/2ML IJ SOLN
4.0000 mg | Freq: Once | INTRAMUSCULAR | Status: AC
  Administered 2024-05-29: 4 mg via INTRAVENOUS
  Filled 2024-05-29: qty 2

## 2024-05-29 MED ORDER — INSULIN ASPART 100 UNIT/ML IJ SOLN
0.0000 [IU] | Freq: Three times a day (TID) | INTRAMUSCULAR | Status: DC
  Administered 2024-05-29 – 2024-05-30 (×3): 3 [IU] via SUBCUTANEOUS
  Administered 2024-05-30: 5 [IU] via SUBCUTANEOUS
  Administered 2024-05-31 – 2024-06-01 (×4): 3 [IU] via SUBCUTANEOUS
  Filled 2024-05-29 (×9): qty 1

## 2024-05-29 MED ORDER — LORATADINE 10 MG PO TABS
10.0000 mg | ORAL_TABLET | Freq: Every day | ORAL | Status: DC
  Administered 2024-05-29 – 2024-06-01 (×4): 10 mg via ORAL
  Filled 2024-05-29 (×4): qty 1

## 2024-05-29 NOTE — Progress Notes (Signed)
 Patient has $362 in cash in wallet that patient requests to keep at bedside. 2- $50, 13- $20 and 2-$1 bills.

## 2024-05-29 NOTE — ED Notes (Signed)
 Pt gone to CT

## 2024-05-29 NOTE — Telephone Encounter (Signed)
 Sleep study confirms obstructive sleep apnea. Needs to start CPAP autotitrate 4-20cm with heat and humidity.  Are you able to place order through parachute?

## 2024-05-29 NOTE — ED Provider Notes (Signed)
 [facility] Provider Note    Event Date/Time   First MD Initiated Contact with Patient 05/29/24 [phone]     (approximate)   History   Shortness of Breath   HPI  Lawrence [name] is a 68 y.o. male who presents to the ED for evaluation of Shortness of Breath   I review ED visit from 2 days ago where patient was seen for atypical chest discomfort, x-ray imaging of the chest suggestive of pneumonia and he was discharged on 1 week of Augmentin . He otherwise has a history of A-fib on Eliquis , CAD s/p CABG and associated CHF with recovered systolic function.  COPD and DM.  Patient presents to the ED for evaluation of shortness of breath, cough and hematemesis.  Reports 2 days of nausea and vomiting with some mild upper abdominal pain.  2 episodes of emesis yesterday were nonbloody nonbilious, but he had 1 episode this morning with blood streaking within it.  Denies other bleeding diatheses such as hemoptysis, melena.   Reports compliance with his antibiotics, including this morning, but did vomit about 30 minutes after   Physical Exam   Triage Vital Signs: ED Triage Vitals  Encounter Vitals Group     BP      Girls Systolic BP Percentile      Girls Diastolic BP Percentile      Boys Systolic BP Percentile      Boys Diastolic BP Percentile      Pulse      Resp      Temp      Temp src      SpO2      Weight      Height      Head Circumference      Peak Flow      Pain Score      Pain Loc      Pain Education      Exclude from Growth Chart     Most recent vital signs: Vitals:   05/29/24 1200 05/29/24 1300  BP: 118/83 125/77  Pulse: (!) 102 93  Resp: 14 10  Temp:    SpO2: 93% 92%    General: Awake, no distress.  CV:  Good peripheral perfusion.  Resp:  Tachypnea to the low 20s.  No distress.  Speaking in full sentences.  Expiratory wheezes throughout with slightly decreased airflow. Abd:  No distention.  MSK:  No deformity noted.  Neuro:  No  focal deficits appreciated. Other:     ED Results / Procedures / Treatments   Labs (all labs ordered are listed, but only abnormal results are displayed) Labs Reviewed  COMPREHENSIVE METABOLIC PANEL WITH GFR - Abnormal; Notable for the following components:      Result Value   Glucose, Bld 137 (*)    Total Bilirubin 1.8 (*)    All other components within normal limits  CBC WITH DIFFERENTIAL/PLATELET - Abnormal; Notable for the following components:   RDW 19.0 (*)    Platelets 106 (*)    Lymphs Abs 0.6 (*)    All other components within normal limits  URINALYSIS, ROUTINE W REFLEX MICROSCOPIC - Abnormal; Notable for the following components:   Color, Urine YELLOW (*)    APPearance CLEAR (*)    Glucose, UA >=500 (*)    Hgb urine dipstick SMALL (*)    Ketones, ur 20 (*)    All other components within normal limits  CULTURE, BLOOD (SINGLE)  LACTIC ACID, PLASMA  LACTIC  ACID, PLASMA  LIPASE, BLOOD  MAGNESIUM   TROPONIN I (HIGH SENSITIVITY)  TROPONIN I (HIGH SENSITIVITY)    EKG A-fib with RVR, rate of 116 bpm.  Normal axis and intervals without STEMI.  RADIOLOGY 1 view CXR interpreted by me with right sided opacity. CT abdomen/pelvis interpreted by me without evidence of acute pathology  Official radiology report(s): CT ABDOMEN PELVIS W CONTRAST Result Date: 05/29/2024 CLINICAL DATA:  Abdominal pain, emesis, hematemesis EXAM: CT ABDOMEN AND PELVIS WITH CONTRAST TECHNIQUE: Multidetector CT imaging of the abdomen and pelvis was performed using the standard protocol following bolus administration of intravenous contrast. RADIATION DOSE REDUCTION: This exam was performed according to the departmental dose-optimization program which includes automated exposure control, adjustment of the mA and/or kV according to patient size and/or use of iterative reconstruction technique. CONTRAST:  OMNIPAQUE  IOHEXOL  300 MG/ML  SOLN COMPARISON:  May 27, 2024 CTA FINDINGS: lower chest:  Redemonstration of opacities at the visualized right lung base. Hepatobiliary: No suspicious liver lesions. Status post cholecystectomy. No biliary ductal dilation. Pancreas: Unremarkable. Spleen: Unremarkable. Adrenals/Urinary Tract: Unchanged mild bilateral adrenal gland nodularity. No nephrolithiasis or hydronephrosis. Stomach/Bowel: No evidence of bowel obstruction or inflammation. Appendix is unremarkable. Small hiatal hernia. Vascular/Lymphatic: Normal caliber aorta. No lymphadenopathy by size criteria. Reproductive: Unremarkable. Other: No free air or free fluid. Musculoskeletal: No acute osseous findings. IMPRESSION: No acute pathology in the abdomen or pelvis. Redemonstration of likely pneumonia involving the partially imaged right lower lung. Electronically Signed   By: Michaeline Blanch M.D.   On: 05/29/2024 12:37   DG Chest Portable 1 View Result Date: 05/29/2024 CLINICAL DATA:  COPD.  Recent pneumonia.  Shortness of breath EXAM: PORTABLE CHEST 1 VIEW COMPARISON:  X-ray 05/27/2024. FINDINGS: Hyperinflation. Chronic interstitial changes. Normal cardiopericardial silhouette with sternal wires. Coronary artery stents are suggested. Calcifications towards the mitral valve annulus. On the prior there is some hazy ill-defined opacity right upper lung which show some improvement with some residual. Recommend continued follow-up. No pneumothorax or effusion. No edema. IMPRESSION: Slight decrease in the hazy opacity right upper lung with some residual. Recommend continued follow-up to confirm resolution. Electronically Signed   By: Ranell Bring M.D.   On: 05/29/2024 10:14    PROCEDURES and INTERVENTIONS:  .Critical Care  Performed by: Claudene Rover, MD Authorized by: Claudene Rover, MD   Critical care provider statement:    Critical care time (minutes):  30   Critical care time was exclusive of:  Separately billable procedures and treating other patients   Critical care was necessary to treat or prevent  imminent or life-threatening deterioration of the following conditions:  Cardiac failure and circulatory failure   Critical care was time spent personally by me on the following activities:  Development of treatment plan with patient or surrogate, discussions with consultants, evaluation of patient's response to treatment, examination of patient, ordering and review of laboratory studies, ordering and review of radiographic studies, ordering and performing treatments and interventions, pulse oximetry, re-evaluation of patient's condition and review of old charts .1-3 Lead EKG Interpretation  Performed by: Claudene Rover, MD Authorized by: Claudene Rover, MD     Interpretation: abnormal     ECG rate:  121   ECG rate assessment: tachycardic     Rhythm: atrial fibrillation     Ectopy: none     Conduction: normal     Medications  diltiazem  (CARDIZEM ) injection 10 mg (has no administration in time range)  diltiazem  (CARDIZEM ) 125 mg in dextrose  5% 125 mL (1  mg/mL) infusion (has no administration in time range)  lactated ringers  bolus 1,000 mL (0 mLs Intravenous Stopped 05/29/24 1203)  ondansetron  (ZOFRAN ) injection 4 mg (4 mg Intravenous Given 05/29/24 1000)  morphine  (PF) 4 MG/ML injection 6 mg (6 mg Intravenous Given 05/29/24 1003)  ipratropium-albuterol  (DUONEB) 0.5-2.5 (3) MG/3ML nebulizer solution 6 mL (6 mLs Nebulization Given 05/29/24 1019)  methylPREDNISolone  sodium succinate (SOLU-MEDROL ) 125 mg/2 mL injection 125 mg (125 mg Intravenous Given 05/29/24 1003)  pantoprazole  (PROTONIX ) injection 40 mg (40 mg Intravenous Given 05/29/24 1053)  HYDROmorphone  (DILAUDID ) injection 0.5 mg (0.5 mg Intravenous Given 05/29/24 1053)  amoxicillin -clavulanate (AUGMENTIN ) 875-125 MG per tablet 1 tablet (1 tablet Oral Given 05/29/24 1106)  iohexol  (OMNIPAQUE ) 300 MG/ML solution 100 mL (100 mLs Intravenous Contrast Given 05/29/24 1216)     IMPRESSION / MDM / ASSESSMENT AND PLAN / ED COURSE  I reviewed the triage  vital signs and the nursing notes.  Differential diagnosis includes, but is not limited to, ACS, PTX, PNA, muscle strain/spasm, PE, dissection, anxiety, pleural effusion  {Patient presents with symptoms of an acute illness or injury that is potentially life-threatening.  Patient with recently diagnosed pneumonia presents to the ED with malaise, an episode of hematemesis, nausea likely multifactorial.  Has a history of GERD with an upcoming outpatient EGD in the next couple weeks.  Has normal hemoglobin is given Protonix .  No ongoing bleeding here in the ED.  Imaging with known pneumonia.  No evidence of acute intra-abdominal pathology on CT imaging.  Signs of COPD sats are Bashan on exam, improving with breathing treatments and starting on steroids.  Persistently tachycardic in A-fib .  Initiate diltiazem  and consult medicine for admission.  Clinical Course as of 05/29/24 1315  Mon May 29, 2024  1306 Reassessed.  Still in A-fib with RVR.  Reports feeling terrible and uncomfortable going home.  We will admit this patient. [DS]  1315 I consulted medicine who agrees to admit [DS]    Clinical Course User Index [DS] Claudene Rover, MD     FINAL CLINICAL IMPRESSION(S) / ED DIAGNOSES   Final diagnoses:  None     Rx / DC Orders   ED Discharge Orders     None        Note:  This document was prepared using Dragon voice recognition software and may include unintentional dictation errors.   Claudene Rover, MD 05/29/24 1320

## 2024-05-29 NOTE — ED Triage Notes (Signed)
 Pt to ED via [facility] from home c/o shob. Pt was recently prescribed antibiotics for pneumonia 3 days ago. Pt reports coughing up dark red sputum x 3 days. Pt does report taking Eliquis . Hx of COPD, stent x 4, MI, double bypass. A&O x 4. Pt uses walker at baseline.    131/82 70-80 HR 92-99 RA 98.2 oral  98 CBG

## 2024-05-29 NOTE — ED Notes (Signed)
 Called and placed pt on central monitoring.

## 2024-05-29 NOTE — H&P (Signed)
 History and Physical    Lawrence [name] FMW:[id number] DOB: 08/06/1956 DOA: 05/29/2024  PCP: [name], MD (Confirm with patient/family/NH records and if not entered, this has to be entered at [facility] point of entry) Patient coming from: Home  I have personally briefly reviewed patient's old medical records in [facility]  Chief Complaint: Cough wheezing shortness of breath coughing up vs throwing up blood  HPI: Lawrence [name] is a 68 y.o. male with medical history significant of CAD status post CABG and stenting, chronic combined HFrEF and HFpEF with LVEF 30-35%, PAF on Eliquis , COPD, gout, presented with multiple complaints about worsening of cough wheezing shortness of breath as well as coughing versus throwing up blood.  Symptoms started 3 to 5 days ago, patient started develop productive cough with yellow phlegm and wheezing and shortness of breath and went to see PCP on Friday was started on p.o. antibiotics to treat  bronchitis.  Patient continued to experience cough and wheezing and has been using around-the-clock albuterol  for breathing symptoms.  This morning, he woke up and feeling nauseous and then vomited versus coughing up 2 times of sputum mixed with streak of blood.  He admitted some sharp lower chest pain worsening of cough and deep breaths denied any abdominal pain no diarrhea no fever or chills. ED Course: Afebrile, tachycardia EKG showed A-fib with RVR O2 saturation 98% on room air blood pressure 120/77.  Chest x-ray negative for acute infiltrates, blood work showed troponin negative x 2 WBC 8.7 hemoglobin 13 BUN 15 creatinine 0.9 bicarb 27.  Patient was given IV Solu-Medrol  and breathing treatment in the ED.  Review of Systems: As per HPI otherwise 14 point review of systems negative.    Past Medical History:  Diagnosis Date   A-fib (HCC)    Anemia    Anginal pain (HCC)    Anxiety    Arthritis    Asthma    CAD (coronary artery disease)    a. 2002 CABGx2  (LIMA->LAD, VG->VG->OM1);  b. 09/2012 DES->OM;  c. 03/2015 PTCA of LAD [facility]) in setting of atretic LIMA; d. 05/2015 Cath [facility]): nonobs dzs; e. 06/2015 Cath ([facility]): LM nl, LAD 45p/d ISR, 50d, D1/2 small, LCX 50p/d ISR, OM1 70ost, 30 ISR, VG->OM1 50ost, 92m, LIMA->LAD 99p/d - atretic, RCA dom, nl; f.cath 10/16: 40-50%(FFR 0.90) pLAD, 75% (FFR 0.77) mLAD s/p PCI/DES, oRCA 40% (FFR0.95)   Cancer (HCC)    SKIN CANCER ON BACK   Celiac disease    Chronic diastolic CHF (congestive heart failure) (HCC)    a. 06/2009 Echo: EF 60-65%, Gr 1 DD, triv AI, mildly dil LA, nl RV.   COPD (chronic obstructive pulmonary disease) (HCC)    a. Chronic bronchitis and emphysema.   DDD (degenerative disc disease), lumbar    Diverticulosis    Dysrhythmia    Essential hypertension    GERD (gastroesophageal reflux disease)    History of hiatal hernia    History of kidney stones    H/O   History of tobacco abuse    a. Quit 2014.   Myocardial infarction [facility]) 2002   4 STENTS   Pancreatitis    PSVT (paroxysmal supraventricular tachycardia) (HCC)    a. 10/2012 Noted on Zio Patch.   Sleep apnea    LOST CORD TO CPAP -ONLY 02 @ BEDTIME   Tubular adenoma of colon    Type II diabetes mellitus (HCC)     Past Surgical History:  Procedure Laterality Date   BYPASS  GRAFT     CARDIAC CATHETERIZATION N/A 07/12/2015   rocedure: Left Heart Cath and Cors/Grafts Angiography;  Surgeon: Victory LELON Sharps, MD;  Location: [facility] INVASIVE CV LAB;  Service: Cardiovascular;  Laterality: N/A;   CARDIAC CATHETERIZATION Right 10/07/2015   Procedure: Left Heart Cath and Cors/Grafts Angiography;  Surgeon: Denyse DELENA Bathe, MD;  Location: [facility] INVASIVE CV LAB;  Service: Cardiovascular;  Laterality: Right;   CARDIAC CATHETERIZATION N/A 04/06/2016   Procedure: Left Heart Cath and Coronary Angiography;  Surgeon: Cara JONETTA Lovelace, MD;  Location: [facility] INVASIVE CV LAB;  Service: Cardiovascular;  Laterality: N/A;   CARDIAC CATHETERIZATION  04/06/2016    Procedure: Bypass Graft Angiography;  Surgeon: Cara JONETTA Lovelace, MD;  Location: [facility] INVASIVE CV LAB;  Service: Cardiovascular;;   CARDIAC CATHETERIZATION N/A 11/02/2016   Procedure: Left Heart Cath and Cors/Grafts Angiography and possible PCI;  Surgeon: Cara JONETTA Lovelace, MD;  Location: [facility] INVASIVE CV LAB;  Service: Cardiovascular;  Laterality: N/A;   CARDIAC CATHETERIZATION N/A 11/02/2016   Procedure: Coronary Stent Intervention;  Surgeon: Cara JONETTA Lovelace, MD;  Location: [facility] INVASIVE CV LAB;  Service: Cardiovascular;  Laterality: N/A;   CARDIOVERSION N/A 06/29/2022   Procedure: CARDIOVERSION;  Surgeon: Hester Wolm PARAS, MD;  Location: [facility] ORS;  Service: Cardiovascular;  Laterality: N/A;   CHOLECYSTECTOMY     CIRCUMCISION N/A 06/09/2019   Procedure: CIRCUMCISION ADULT;  Surgeon: Francisca Redell BROCKS, MD;  Location: [facility] ORS;  Service: Urology;  Laterality: N/A;   COLONOSCOPY WITH PROPOFOL  N/A 04/01/2018   Procedure: COLONOSCOPY WITH PROPOFOL ;  Surgeon: Viktoria Lamar DASEN, MD;  Location: [facility] ENDOSCOPY;  Service: Endoscopy;  Laterality: N/A;   COLONOSCOPY WITH PROPOFOL  N/A 05/01/2023   Procedure: COLONOSCOPY WITH PROPOFOL ;  Surgeon: Unk Corinn Skiff, MD;  Location: [facility] ENDOSCOPY;  Service: Gastroenterology;  Laterality: N/A;   ESOPHAGEAL DILATION     ESOPHAGOGASTRODUODENOSCOPY (EGD) WITH PROPOFOL  N/A 04/01/2018   Procedure: ESOPHAGOGASTRODUODENOSCOPY (EGD) WITH PROPOFOL ;  Surgeon: Viktoria Lamar DASEN, MD;  Location: [facility] ENDOSCOPY;  Service: Endoscopy;  Laterality: N/A;   ESOPHAGOGASTRODUODENOSCOPY (EGD) WITH PROPOFOL  N/A 05/01/2023   Procedure: ESOPHAGOGASTRODUODENOSCOPY (EGD) WITH PROPOFOL ;  Surgeon: Unk Corinn Skiff, MD;  Location: [facility] ENDOSCOPY;  Service: Gastroenterology;  Laterality: N/A;   GIVENS CAPSULE STUDY  05/01/2023   Procedure: GIVENS CAPSULE STUDY;  Surgeon: Unk Corinn Skiff, MD;  Location: [facility] ENDOSCOPY;  Service: Gastroenterology;;   LEFT HEART CATH AND CORS/GRAFTS  ANGIOGRAPHY N/A 06/12/2019   Procedure: LEFT HEART CATH AND CORS/GRAFTS ANGIOGRAPHY;  Surgeon: Bosie Vinie DELENA, MD;  Location: [facility] INVASIVE CV LAB;  Service: Cardiovascular;  Laterality: N/A;   LEFT HEART CATH AND CORS/GRAFTS ANGIOGRAPHY N/A 03/11/2020   Procedure: LEFT HEART CATH AND CORS/GRAFTS ANGIOGRAPHY;  Surgeon: Ammon Blunt, MD;  Location: [facility] INVASIVE CV LAB;  Service: Cardiovascular;  Laterality: N/A;   LEFT HEART CATH AND CORS/GRAFTS ANGIOGRAPHY N/A 05/01/2021   Procedure: LEFT HEART CATH AND CORS/GRAFTS ANGIOGRAPHY;  Surgeon: Hester Wolm PARAS, MD;  Location: [facility] INVASIVE CV LAB;  Service: Cardiovascular;  Laterality: N/A;   LEFT HEART CATH AND CORS/GRAFTS ANGIOGRAPHY N/A 11/02/2022   Procedure: LEFT HEART CATH AND CORS/GRAFTS ANGIOGRAPHY;  Surgeon: Lovelace Cara JONETTA, MD;  Location: [facility] INVASIVE CV LAB;  Service: Cardiovascular;  Laterality: N/A;   TONSILLECTOMY     VASCULAR SURGERY       reports that he quit smoking about 11 years ago. His smoking use included cigarettes. He started smoking about 61 years ago. He has a 150 pack-year smoking history. He has never used smokeless  tobacco. He reports that he does not currently use drugs after having used the following drugs: Marijuana. He reports that he does not drink alcohol.  Allergies  Allergen Reactions   Prednisone  Other (See Comments) and Hypertension    Pt states that this medication puts him in A-fib    Demerol  [Meperidine Hcl]    Demerol [Meperidine] Hives   Sulfa Antibiotics Hives   Empagliflozin  Other (See Comments) and Hives    Perineal pain  Other Reaction(s): Other (See Comments)   Morphine  Sulfate Nausea And Vomiting, Rash and Other (See Comments)    Pt states that he is only allergic to the tablet form of this medication.      Family History  Problem Relation Age of Onset   Heart attack Mother    Depression Mother    Heart disease Mother    COPD Mother    Hypertension Mother    Heart attack  Father    Diabetes Father    Depression Father    Heart disease Father    Cirrhosis Father    Parkinson's disease Brother     Prior to Admission medications   Medication Sig Start Date End Date Taking? Authorizing Provider  Accu-Chek Softclix Lancets lancets Use as instructed to check sugar daily for type 2 diabetes. 03/03/21   [name], MD  albuterol  (PROVENTIL ) (2.5 MG/3ML) 0.083% nebulizer solution Take 3 mLs (2.5 mg total) by nebulization every 6 (six) hours as needed for wheezing or shortness of breath. 05/26/24   [name], MD  allopurinol  (ZYLOPRIM ) 300 MG tablet TAKE 1 TABLET BY MOUTH TWICE A DAY 04/12/24   [name], MD  ALPRAZolam  (XANAX ) 1 MG tablet TAKE 1 TABLET BY MOUTH 3 TIMES A DAY AS NEEDED 03/29/24   [name], MD  aluminum -magnesium  hydroxide-simethicone  (MAALOX) 200-200-20 MG/5ML SUSP Take 30 mLs by mouth 4 (four) times daily -  before meals and at bedtime. 03/12/24   [name], MD  amLODipine  (NORVASC ) 10 MG tablet Take 10 mg by mouth daily.    [provider]  amoxicillin -clavulanate (AUGMENTIN ) 875-125 MG tablet Take 1 tablet by mouth 2 (two) times daily for 7 days. 05/27/24 06/03/24  Bradler, Evan K, MD  apixaban  (ELIQUIS ) 5 MG TABS tablet Take 1 tablet (5 mg total) by mouth 2 (two) times daily. 10/04/23   [name], MD  atorvastatin  (LIPITOR ) 80 MG tablet TAKE 1 TABLET BY MOUTH AT BEDTIME 07/15/23   [name], MD  Blood Glucose Calibration (ACCU-CHEK GUIDE CONTROL) LIQD Use with blood glucose monitor as directed 12/01/23   [name], MD  Blood Glucose Monitoring Suppl (ACCU-CHEK GUIDE) w/Device KIT Use to check blood sugars as directed 12/01/23   [name], MD  Budeson-Glycopyrrol-Formoterol  (BREZTRI  AEROSPHERE) 160-9-4.8 MCG/ACT AERO Inhale 2 puffs into the lungs 2 (two) times daily. 06/02/23   [name], MD  carvedilol  (COREG ) 3.125 MG tablet TAKE 1 TABLET BY MOUTH TWICE A DAY 03/22/24   [name], MD  cetirizine  (ZYRTEC ) 10 MG tablet Take 1 tablet (10 mg total) by mouth at bedtime. 03/31/24   [name], MD  Cyanocobalamin  (VITAMIN B-12) 1000 MCG SUBL Place 1 tablet under the tongue daily.    [provider]  dapagliflozin  propanediol (FARXIGA ) 10 MG TABS tablet TAKE 1 TABLET BY MOUTH DAILY 03/28/24   [name], MD  doxycycline  (VIBRA -TABS) 100 MG tablet Take 1 tablet (100 mg total) by mouth 2 (two)  times daily for 10 days. 05/19/24 05/29/24  [name], MD  famotidine  (PEPCID ) 20 MG tablet Take 1 tablet (20 mg total) by mouth 2 (two) times daily. 04/10/24   [name], MD  furosemide  (LASIX ) 20 MG tablet Take 1 tablet (20 mg total) by mouth daily as needed. 04/18/24   [name], MD  gabapentin  (NEURONTIN ) 300 MG capsule Take 1 capsule (300 mg total) by mouth 2 (two) times daily. 05/22/24   [name], MD  glucose blood (ACCU-CHEK GUIDE) test strip Use as instructed to check sugar daily for type 2 diabetes. 03/03/21   [name], MD  hydrOXYzine  (VISTARIL ) 25 MG capsule Take 1 capsule (25 mg total) by mouth 3 (three) times daily as needed. Patient not taking: Reported on 05/26/2024 12/20/23   Ernest Ronal BRAVO, MD  iron  polysaccharides (FERREX 150) 150 MG capsule TAKE 1 CAPSULE BY MOUTH DAILY 09/15/23   [name], MD  isosorbide  mononitrate (IMDUR ) 120 MG 24 hr tablet Take 1 tablet (120 mg total) by mouth daily. 02/10/24 02/09/25  Von Bellis, MD  isosorbide  mononitrate (IMDUR ) 60 MG 24 hr tablet Take 60 mg by mouth daily. 05/12/24   [provider]  lithium  carbonate 150 MG capsule Take by mouth. 03/24/24   [provider]  metFORMIN  (GLUCOPHAGE -XR) 500 MG 24 hr tablet TAKE 1 TABLET BY MOUTH DAILY WITH EVENING MEAL 01/12/24   [name], MD  methocarbamol  (ROBAXIN ) 500 MG tablet TAKE 1 TABLET BY MOUTH EVERY 8 HOURS AS NEEDED FOR MUSCLE SPASMS 01/13/24   [name], MD  metoCLOPramide  (REGLAN ) 10 MG tablet Take 1 tablet (10 mg  total) by mouth 4 (four) times daily -  before meals and at bedtime for 15 days. 03/12/24 03/27/24  [name], MD  metoprolol  succinate (TOPROL -XL) 25 MG 24 hr tablet Take 12.5 mg by mouth daily. 11/04/23   [provider]  metroNIDAZOLE  (METROGEL ) 1 % gel Apply topically daily. 05/26/24   [name], MD  nitroGLYCERIN  (NITROSTAT ) 0.4 MG SL tablet Place 1 tablet (0.4 mg total) under the tongue every 5 (five) minutes as needed for chest pain (Do not take if blood pressure is low, systolic BP<110). 10/21/23   [name], MD  omega-3 acid ethyl esters (LOVAZA ) 1 g capsule TAKE 4 CAPSULES (4 GRAMS TOTAL) BY MOUTHDAILY 03/02/24   [name], MD  oxyCODONE -acetaminophen  (PERCOCET) 10-325 MG tablet TAKE 1 TABLET BY MOUTH EVERY 4 HOURS AS NEEDED FOR PAIN 05/24/24   [name], MD  OXYGEN  Inhale 2 L into the lungs at bedtime as needed (for shortness of breath).    [provider]  pantoprazole  (PROTONIX ) 40 MG tablet TAKE 1 TABLET BY MOUTH 2 TIMES DAILY (30MINUTES BEFORE A MEAL) 01/12/24   [name], MD  ranolazine  (RANEXA ) 1000 MG SR tablet Take 1 tablet (1,000 mg total) by mouth 2 (two) times daily. Patient not taking: Reported on 05/26/2024 08/05/23   Darci Pore, MD  sacubitril -valsartan  (ENTRESTO ) 24-26 MG Take 1 tablet by mouth 2 (two) times daily.    [provider]  sennosides-docusate sodium  (SENOKOT-S) 8.6-50 MG tablet Take 1 tablet by mouth daily.    [provider]  spironolactone  (ALDACTONE ) 25 MG tablet Take 1 tablet (25 mg total) by mouth daily. 03/10/24   [name], MD  sucralfate  (CARAFATE ) 1 g tablet Take 1 tablet (1 g total) by mouth every 8 (eight) hours as needed for up to 10  days. 04/23/24 05/03/24  Levander Slate, MD  sucralfate  (CARAFATE ) 1 GM/10ML suspension Take 10 mLs (1 g total) by mouth 4 (four) times daily. 11/23/23 11/22/24  Cyrena Mylar, MD  traZODone  (DESYREL ) 150 MG tablet Take 1 tablet (150 mg total) by mouth  at bedtime as needed for sleep. 03/31/24   [name], MD  TRINTELLIX  10 MG TABS tablet Take 10 mg by mouth daily.    [provider]  venlafaxine  (EFFEXOR ) 25 MG tablet Take 25 mg by mouth daily. Patient not taking: Reported on 05/05/2024 03/22/24   [provider]    Physical Exam: Vitals:   05/29/24 1030 05/29/24 1106 05/29/24 1200 05/29/24 1300  BP: 108/77 136/84 118/83 125/77  Pulse: 92 96 (!) 102 93  Resp: 15 16 14 10   Temp:      TempSrc:      SpO2: 99% 93% 93% 92%  Weight:      Height:        Constitutional: NAD, calm, comfortable Vitals:   05/29/24 1030 05/29/24 1106 05/29/24 1200 05/29/24 1300  BP: 108/77 136/84 118/83 125/77  Pulse: 92 96 (!) 102 93  Resp: 15 16 14 10   Temp:      TempSrc:      SpO2: 99% 93% 93% 92%  Weight:      Height:       Eyes: PERRL, lids and conjunctivae normal ENMT: Mucous membranes are moist. Posterior pharynx clear of any exudate or lesions.Normal dentition.  Neck: normal, supple, no masses, no thyromegaly Respiratory: Diminished breathing sound bilaterally, diffused wheezing, scattered crackles bilaterally, increasing respiratory effort. No accessory muscle use.  Cardiovascular: Regular rate and rhythm, no murmurs / rubs / gallops. No extremity edema. 2+ pedal pulses. No carotid bruits.  Abdomen: no tenderness, no masses palpated. No hepatosplenomegaly. Bowel sounds positive.  Musculoskeletal: no clubbing / cyanosis. No joint deformity upper and lower extremities. Good ROM, no contractures. Normal muscle tone.  Skin: no rashes, lesions, ulcers. No induration Neurologic: CN 2-12 grossly intact. Sensation intact, DTR normal. Strength 5/5 in all 4.  Psychiatric: Normal judgment and insight. Alert and oriented x 3. Normal mood.     Labs on Admission: I have personally reviewed following labs and imaging studies  CBC: Recent Labs  Lab 05/27/24 1403 05/29/24 0937  WBC 10.2 8.7  NEUTROABS  --  7.4  HGB 15.9 15.0   HCT 51.0 48.3  MCV 89.6 90.3  PLT 120* 106*   Basic Metabolic Panel: Recent Labs  Lab 05/27/24 1403 05/29/24 0937  NA 137 139  K 4.3 4.1  CL 101 100  CO2 27 27  GLUCOSE 164* 137*  BUN 22 15  CREATININE 1.18 0.99  CALCIUM  9.1 9.5  MG  --  1.9   GFR: Estimated Creatinine Clearance: 70.6 mL/min (by C-G formula based on SCr of 0.99 mg/dL). Liver Function Tests: Recent Labs  Lab 05/27/24 1403 05/29/24 0937  AST 24 19  ALT 21 20  ALKPHOS 53 50  BILITOT 1.5* 1.8*  PROT 6.5 6.5  ALBUMIN 3.6 3.7   Recent Labs  Lab 05/27/24 1403 05/29/24 0937  LIPASE 36 35   No results for input(s): AMMONIA in the last 168 hours. Coagulation Profile: No results for input(s): INR, PROTIME in the last 168 hours. Cardiac Enzymes: Recent Labs  Lab 05/27/24 1403  CKTOTAL 143   BNP (last 3 results) No results for input(s): PROBNP in the last 8760 hours. HbA1C: No results for input(s): HGBA1C in the last 72  hours. CBG: No results for input(s): GLUCAP in the last 168 hours. Lipid Profile: No results for input(s): CHOL, HDL, LDLCALC, TRIG, CHOLHDL, LDLDIRECT in the last 72 hours. Thyroid  Function Tests: No results for input(s): TSH, T4TOTAL, FREET4, T3FREE, THYROIDAB in the last 72 hours. Anemia Panel: No results for input(s): VITAMINB12, FOLATE, FERRITIN, TIBC, IRON , RETICCTPCT in the last 72 hours. Urine analysis:    Component Value Date/Time   COLORURINE YELLOW (A) 05/29/2024 0942   APPEARANCEUR CLEAR (A) 05/29/2024 0942   APPEARANCEUR Clear 05/25/2019 1554   LABSPEC 1.013 05/29/2024 0942   LABSPEC 1.012 03/07/2015 2143   PHURINE 7.0 05/29/2024 0942   GLUCOSEU >=500 (A) 05/29/2024 0942   GLUCOSEU Negative 03/07/2015 2143   HGBUR SMALL (A) 05/29/2024 0942   BILIRUBINUR NEGATIVE 05/29/2024 0942   BILIRUBINUR Negative 05/25/2019 1554   BILIRUBINUR Negative 03/07/2015 2143   KETONESUR 20 (A) 05/29/2024 0942   PROTEINUR NEGATIVE  05/29/2024 0942   UROBILINOGEN 0.2 03/26/2017 0907   NITRITE NEGATIVE 05/29/2024 0942   LEUKOCYTESUR NEGATIVE 05/29/2024 0942   LEUKOCYTESUR Negative 03/07/2015 2143    Radiological Exams on Admission: CT ABDOMEN PELVIS W CONTRAST Result Date: 05/29/2024 CLINICAL DATA:  Abdominal pain, emesis, hematemesis EXAM: CT ABDOMEN AND PELVIS WITH CONTRAST TECHNIQUE: Multidetector CT imaging of the abdomen and pelvis was performed using the standard protocol following bolus administration of intravenous contrast. RADIATION DOSE REDUCTION: This exam was performed according to the departmental dose-optimization program which includes automated exposure control, adjustment of the mA and/or kV according to patient size and/or use of iterative reconstruction technique. CONTRAST:  OMNIPAQUE  IOHEXOL  300 MG/ML  SOLN COMPARISON:  May 27, 2024 CTA FINDINGS: lower chest: Redemonstration of opacities at the visualized right lung base. Hepatobiliary: No suspicious liver lesions. Status post cholecystectomy. No biliary ductal dilation. Pancreas: Unremarkable. Spleen: Unremarkable. Adrenals/Urinary Tract: Unchanged mild bilateral adrenal gland nodularity. No nephrolithiasis or hydronephrosis. Stomach/Bowel: No evidence of bowel obstruction or inflammation. Appendix is unremarkable. Small hiatal hernia. Vascular/Lymphatic: Normal caliber aorta. No lymphadenopathy by size criteria. Reproductive: Unremarkable. Other: No free air or free fluid. Musculoskeletal: No acute osseous findings. IMPRESSION: No acute pathology in the abdomen or pelvis. Redemonstration of likely pneumonia involving the partially imaged right lower lung. Electronically Signed   By: Michaeline Blanch M.D.   On: 05/29/2024 12:37   DG Chest Portable 1 View Result Date: 05/29/2024 CLINICAL DATA:  COPD.  Recent pneumonia.  Shortness of breath EXAM: PORTABLE CHEST 1 VIEW COMPARISON:  X-ray 05/27/2024. FINDINGS: Hyperinflation. Chronic interstitial changes. Normal  cardiopericardial silhouette with sternal wires. Coronary artery stents are suggested. Calcifications towards the mitral valve annulus. On the prior there is some hazy ill-defined opacity right upper lung which show some improvement with some residual. Recommend continued follow-up. No pneumothorax or effusion. No edema. IMPRESSION: Slight decrease in the hazy opacity right upper lung with some residual. Recommend continued follow-up to confirm resolution. Electronically Signed   By: Ranell Bring M.D.   On: 05/29/2024 10:14   CT Angio Chest/Abd/Pel for Dissection W and/or Wo Contrast Result Date: 05/27/2024 CLINICAL DATA:  Acute aortic syndrome suspected. Chest pain following fall. EXAM: CT ANGIOGRAPHY CHEST, ABDOMEN AND PELVIS TECHNIQUE: Non-contrast CT of the chest was initially obtained. Multidetector CT imaging through the chest, abdomen and pelvis was performed using the standard protocol during bolus administration of intravenous contrast. Multiplanar reconstructed images and MIPs were obtained and reviewed to evaluate the vascular anatomy. RADIATION DOSE REDUCTION: This exam was performed according to the departmental dose-optimization program which includes  automated exposure control, adjustment of the mA and/or kV according to patient size and/or use of iterative reconstruction technique. CONTRAST:  OMNIPAQUE  IOHEXOL  350 MG/ML SOLN COMPARISON:  03/17/2024. FINDINGS: CTA CHEST FINDINGS Cardiovascular: The heart is stable in size and multi-vessel coronary artery calcifications are noted. There is atherosclerotic calcification of the aorta without evidence of aneurysm. No dissection or intramural hematoma is seen. The pulmonary trunk is mildly distended suggesting underlying pulmonary artery hypertension. Mediastinum/Nodes: A prominent lymph node is noted in the subcarinal space measuring up to 1.1 cm. Prominent lymph nodes are present at the right hilum measuring up to 1 cm. No axillary  lymphadenopathy is seen. The thyroid  gland, trachea, and esophagus are within normal limits. There is a small hiatal hernia. Lungs/Pleura: Scattered airspace opacities are noted in the right lung with mild consolidation in the right upper lobe. No pleural effusion or pneumothorax is seen. Musculoskeletal: Sternotomy wires are noted. Degenerative changes are present in the thoracic spine. No acute fracture is seen. Review of the MIP images confirms the above findings. CTA ABDOMEN AND PELVIS FINDINGS VASCULAR Aorta: Normal caliber aorta without aneurysm, dissection, vasculitis or significant stenosis. Aortic atherosclerosis. Celiac: Patent without evidence of aneurysm, dissection, vasculitis or significant stenosis. SMA: Patent without evidence of aneurysm, dissection, vasculitis or significant stenosis. Renals: Both renal arteries are patent without evidence of aneurysm, dissection, vasculitis, fibromuscular dysplasia or significant stenosis. IMA: Patent without evidence of aneurysm, dissection, vasculitis or significant stenosis. Inflow: Patent without evidence of aneurysm, dissection, vasculitis or significant stenosis. Veins: No obvious venous abnormality within the limitations of this arterial phase study. Review of the MIP images confirms the above findings. NON-VASCULAR Hepatobiliary: No focal liver abnormality is seen. Status post cholecystectomy. No biliary dilatation. Pancreas: Unremarkable. No pancreatic ductal dilatation or surrounding inflammatory changes. Spleen: Normal in size without focal abnormality. Adrenals/Urinary Tract: The adrenal glands are within normal limits. The kidneys enhance symmetrically. No renal calculus or hydronephrosis bilaterally. There is diffuse bladder wall thickening. Stomach/Bowel: There is a small hiatal hernia. The stomach is otherwise within normal limits. No bowel obstruction, free air, or pneumatosis. Scattered diverticula are present along the colon without evidence of  diverticulitis. Appendix appears normal. Lymphatic: No abdominal or pelvic lymphadenopathy. Reproductive: Prostate is unremarkable. Other: No abdominopelvic ascites. A fat containing inguinal hernia is noted on the left. Musculoskeletal: Degenerative changes are present in the lumbar spine. No acute fracture is seen. Review of the MIP images confirms the above findings. IMPRESSION: 1. Aortic atherosclerosis with no evidence of aortic aneurysm or dissection. 2. Scattered airspace opacities in the right lung, suspicious for pneumonia. 3. Diffuse bladder wall thickening, possible infectious or inflammatory cystitis. 4. Diverticulosis without diverticulitis. 5. Coronary artery calcifications. Electronically Signed   By: Leita Birmingham M.D.   On: 05/27/2024 16:08   CT Maxillofacial Wo Contrast Result Date: 05/27/2024 CLINICAL DATA:  Facial trauma, blunt EXAM: CT MAXILLOFACIAL WITHOUT CONTRAST TECHNIQUE: Multidetector CT imaging of the maxillofacial structures was performed. Multiplanar CT image reconstructions were also generated. RADIATION DOSE REDUCTION: This exam was performed according to the departmental dose-optimization program which includes automated exposure control, adjustment of the mA and/or kV according to patient size and/or use of iterative reconstruction technique. COMPARISON:  CT the head dated Mar 29, 2024. FINDINGS: Osseous: The facial bones are intact. The patient is edentulous. No bony lesions are present. Orbits: Normal orbits. Sinuses: Minimal mucosal disease within the maxillary and ethmoid sinuses and floor left frontal sinus. Soft tissues: Unremarkable. Limited intracranial: Negative. IMPRESSION: 1. Mild  paranasal sinuses disease. No evidence of acute traumatic injury. Electronically Signed   By: Evalene Coho M.D.   On: 05/27/2024 15:53   CT HEAD WO CONTRAST ( ) Result Date: 05/27/2024 CLINICAL DATA:  Headache, new onset (Age >= 51y) EXAM: CT HEAD WITHOUT CONTRAST TECHNIQUE:  Contiguous axial images were obtained from the base of the skull through the vertex without intravenous contrast. RADIATION DOSE REDUCTION: This exam was performed according to the departmental dose-optimization program which includes automated exposure control, adjustment of the mA and/or kV according to patient size and/or use of iterative reconstruction technique. COMPARISON:  CT the head dated Mar 29, 2024. FINDINGS: Brain: Normal brain. No evidence of hemorrhage, mass, cortical infarct or hydrocephalus. Vascular: Negative. Skull: Negative. Sinuses/Orbits: Minimal mucosal disease within the maxillary sinuses. Normal orbits. Other: None. IMPRESSION: Normal. Electronically Signed   By: Evalene Coho M.D.   On: 05/27/2024 15:51   CT Cervical Spine Wo Contrast Result Date: 05/27/2024 CLINICAL DATA:  Bone lesion, cervical spine, incidental EXAM: CT CERVICAL SPINE WITHOUT CONTRAST TECHNIQUE: Multidetector CT imaging of the cervical spine was performed without intravenous contrast. Multiplanar CT image reconstructions were also generated. RADIATION DOSE REDUCTION: This exam was performed according to the departmental dose-optimization program which includes automated exposure control, adjustment of the mA and/or kV according to patient size and/or use of iterative reconstruction technique. COMPARISON:  CT of the cervical spine dated Mar 29, 2024. FINDINGS: Alignment: Normal. Skull base and vertebrae: Benign lucency within the left lateral aspect of the C3 vertebral body, likely representing cyst or hemangioma. Soft tissues and spinal canal: No paraspinous soft tissue swelling or injury evident. Disc levels: The disc spaces are satisfactory preserved and the spinal canal and neural foramina appear patent. Upper chest: Negative. Other: None. IMPRESSION: 1. Highly likely benign lucent lesion within the C3 vertebral body. Otherwise, negative. Electronically Signed   By: Evalene Coho M.D.   On: 05/27/2024 15:50    DG Chest 2 View Result Date: 05/27/2024 CLINICAL DATA:  Chest pain following fall. EXAM: CHEST - 2 VIEW; RIGHT SHOULDER - 2+ VIEW COMPARISON:  05/18/2024. FINDINGS: Chest: The heart size and mediastinal contours are within normal limits. There is atherosclerotic calcification of the aorta. Scattered airspace opacities are noted in the right lung. No effusion or pneumothorax is seen. Sternotomy wires are present over the midline. Degenerative changes are noted in the thoracic spine. No acute osseous abnormality. Right shoulder: There is no evidence of acute fracture or dislocation. Moderate degenerative changes are noted at the acromioclavicular joint. The humeral head is high riding which may be associated with chronic rotator cuff tear. IMPRESSION: 1. Patchy airspace disease in the right lung, concerning for pneumonia. 2. No acute fracture or dislocation at the right shoulder. 3. Moderate degenerative changes at the glenohumeral and acromioclavicular joints. 4. High riding humeral head on the right which may be associated with chronic rotator cuff tear. Electronically Signed   By: Leita Birmingham M.D.   On: 05/27/2024 14:40   DG Shoulder Right Result Date: 05/27/2024 CLINICAL DATA:  Chest pain following fall. EXAM: CHEST - 2 VIEW; RIGHT SHOULDER - 2+ VIEW COMPARISON:  05/18/2024. FINDINGS: Chest: The heart size and mediastinal contours are within normal limits. There is atherosclerotic calcification of the aorta. Scattered airspace opacities are noted in the right lung. No effusion or pneumothorax is seen. Sternotomy wires are present over the midline. Degenerative changes are noted in the thoracic spine. No acute osseous abnormality. Right shoulder: There is no evidence of acute  fracture or dislocation. Moderate degenerative changes are noted at the acromioclavicular joint. The humeral head is high riding which may be associated with chronic rotator cuff tear. IMPRESSION: 1. Patchy airspace disease in the  right lung, concerning for pneumonia. 2. No acute fracture or dislocation at the right shoulder. 3. Moderate degenerative changes at the glenohumeral and acromioclavicular joints. 4. High riding humeral head on the right which may be associated with chronic rotator cuff tear. Electronically Signed   By: Leita Birmingham M.D.   On: 05/27/2024 14:40    EKG: Independently reviewed.  A-fib with RVR, no acute ST changes.  Assessment/Plan Principal Problem:   Afib (HCC) Active Problems:   Atrial fibrillation, chronic (HCC)  (please populate well all problems here in Problem List. (For example, if patient is on BP meds at home and you resume or decide to hold them, it is a problem that needs to be her. Same for CAD, COPD, HLD and so on)  A-fib with RVR - Discontinue Cardizem  drip, given history of low LVEF - Start as needed Lopressor  - Hold off Eliquis  today, resume Eliquis  tomorrow if H&H stable  Hematemesis versus hemoptysis - Clinically suspect hemoptysis secondary to right lower lobe pneumonia - H&H stable, no indication for transfusion or Eliquis  reversal - Continue to monitor H&H this evening and tomorrow morning - Patient has had flareup of GERD recently and scheduled to have EGD this month with [facility]. - Continue PPI  Acute COPD exacerbation - Continue IV Solu-Medrol  - Change breathing treatment to Xopenex  to avoid tachycardia - ICS in the LABA - Guaifenesin , incentive spirometry and flutter valve  RLL pneumonia - Continue Augmentin , add azithromycin   Chronic combined HFrEF and HFpEF - Euvolemic - Continue diuresis - Continue Entresto   CAD - Chest pain is likely secondary to pneumonia, management as above - Troponin negative x 2 and EKG showed no acute ST changes, ACS ruled out - Continue Imdur  - Continue statin  Chronic narcotic dependence - Tramadol  as needed  IIDM -SSI for now - Hold off metformin   DVT prophylaxis: Eliquis  Code Status: Full code Family  Communication: None at bedside Disposition Plan: Expect less than 2 midnight hospital stay Consults called: None Admission status: PCU observation   Cort ONEIDA Mana MD [facility] Pager [phone]  05/29/2024, 1:39 PM

## 2024-05-29 NOTE — ED Notes (Signed)
 Pt placed on 2L O2 via [place] due to drop in O2 saturation while sleeping. Pt reports hx of sleep apnea.

## 2024-05-30 DIAGNOSIS — Z833 Family history of diabetes mellitus: Secondary | ICD-10-CM | POA: Diagnosis not present

## 2024-05-30 DIAGNOSIS — E1165 Type 2 diabetes mellitus with hyperglycemia: Secondary | ICD-10-CM | POA: Diagnosis present

## 2024-05-30 DIAGNOSIS — Z87891 Personal history of nicotine dependence: Secondary | ICD-10-CM | POA: Diagnosis not present

## 2024-05-30 DIAGNOSIS — J9611 Chronic respiratory failure with hypoxia: Secondary | ICD-10-CM | POA: Diagnosis present

## 2024-05-30 DIAGNOSIS — J189 Pneumonia, unspecified organism: Secondary | ICD-10-CM

## 2024-05-30 DIAGNOSIS — Z23 Encounter for immunization: Secondary | ICD-10-CM | POA: Diagnosis present

## 2024-05-30 DIAGNOSIS — Z8249 Family history of ischemic heart disease and other diseases of the circulatory system: Secondary | ICD-10-CM | POA: Diagnosis not present

## 2024-05-30 DIAGNOSIS — I110 Hypertensive heart disease with heart failure: Secondary | ICD-10-CM | POA: Diagnosis present

## 2024-05-30 DIAGNOSIS — Z7984 Long term (current) use of oral hypoglycemic drugs: Secondary | ICD-10-CM | POA: Diagnosis not present

## 2024-05-30 DIAGNOSIS — Z888 Allergy status to other drugs, medicaments and biological substances status: Secondary | ICD-10-CM | POA: Diagnosis not present

## 2024-05-30 DIAGNOSIS — G8929 Other chronic pain: Secondary | ICD-10-CM | POA: Diagnosis present

## 2024-05-30 DIAGNOSIS — Z66 Do not resuscitate: Secondary | ICD-10-CM | POA: Diagnosis present

## 2024-05-30 DIAGNOSIS — J441 Chronic obstructive pulmonary disease with (acute) exacerbation: Secondary | ICD-10-CM | POA: Diagnosis present

## 2024-05-30 DIAGNOSIS — I480 Paroxysmal atrial fibrillation: Secondary | ICD-10-CM | POA: Diagnosis present

## 2024-05-30 DIAGNOSIS — I5042 Chronic combined systolic (congestive) and diastolic (congestive) heart failure: Secondary | ICD-10-CM | POA: Diagnosis present

## 2024-05-30 DIAGNOSIS — I340 Nonrheumatic mitral (valve) insufficiency: Secondary | ICD-10-CM | POA: Diagnosis present

## 2024-05-30 DIAGNOSIS — Z85828 Personal history of other malignant neoplasm of skin: Secondary | ICD-10-CM | POA: Diagnosis not present

## 2024-05-30 DIAGNOSIS — Z7901 Long term (current) use of anticoagulants: Secondary | ICD-10-CM | POA: Diagnosis not present

## 2024-05-30 DIAGNOSIS — I2510 Atherosclerotic heart disease of native coronary artery without angina pectoris: Secondary | ICD-10-CM | POA: Diagnosis present

## 2024-05-30 DIAGNOSIS — J440 Chronic obstructive pulmonary disease with acute lower respiratory infection: Secondary | ICD-10-CM | POA: Diagnosis present

## 2024-05-30 DIAGNOSIS — K920 Hematemesis: Secondary | ICD-10-CM | POA: Diagnosis present

## 2024-05-30 DIAGNOSIS — R079 Chest pain, unspecified: Secondary | ICD-10-CM | POA: Diagnosis present

## 2024-05-30 DIAGNOSIS — F1120 Opioid dependence, uncomplicated: Secondary | ICD-10-CM | POA: Diagnosis present

## 2024-05-30 DIAGNOSIS — I4891 Unspecified atrial fibrillation: Secondary | ICD-10-CM | POA: Insufficient documentation

## 2024-05-30 DIAGNOSIS — I482 Chronic atrial fibrillation, unspecified: Secondary | ICD-10-CM | POA: Diagnosis present

## 2024-05-30 DIAGNOSIS — Z1152 Encounter for screening for COVID-19: Secondary | ICD-10-CM | POA: Diagnosis not present

## 2024-05-30 HISTORY — DX: Pneumonia, unspecified organism: J18.9

## 2024-05-30 LAB — BASIC METABOLIC PANEL WITH GFR
Anion gap: 10 (ref 5–15)
BUN: 20 mg/dL (ref 8–23)
CO2: 26 mmol/L (ref 22–32)
Calcium: 9.3 mg/dL (ref 8.9–10.3)
Chloride: 104 mmol/L (ref 98–111)
Creatinine, Ser: 1.01 mg/dL (ref 0.61–1.24)
GFR, Estimated: >60 mL/min (ref >60)
Glucose, Bld: 155 mg/dL — ABNORMAL HIGH (ref 70–99)
Potassium: 4.4 mmol/L (ref 3.5–5.1)
Sodium: 140 mmol/L (ref 135–145)

## 2024-05-30 LAB — CBC
HCT: 44 % (ref 39.0–52.0)
Hemoglobin: 14 g/dL (ref 13.0–17.0)
MCH: 28.3 pg (ref 26.0–34.0)
MCHC: 31.8 g/dL (ref 30.0–36.0)
MCV: 88.9 fL (ref 80.0–100.0)
Platelets: 102 K/uL — ABNORMAL LOW (ref 150–400)
RBC: 4.95 MIL/uL (ref 4.22–5.81)
RDW: 18.4 % — ABNORMAL HIGH (ref 11.5–15.5)
WBC: 4.6 K/uL (ref 4.0–10.5)
nRBC: 0 % (ref 0.0–0.2)

## 2024-05-30 LAB — GLUCOSE, CAPILLARY
Glucose-Capillary: 155 mg/dL — ABNORMAL HIGH (ref 70–99)
Glucose-Capillary: 176 mg/dL — ABNORMAL HIGH (ref 70–99)
Glucose-Capillary: 201 mg/dL — ABNORMAL HIGH (ref 70–99)
Glucose-Capillary: 124 mg/dL — ABNORMAL HIGH (ref 70–99)

## 2024-05-30 LAB — BRAIN NATRIURETIC PEPTIDE: B Natriuretic Peptide: 483.4 pg/mL — ABNORMAL HIGH (ref 0.0–100.0)

## 2024-05-30 LAB — HEMOGLOBIN: Hemoglobin: 15.2 g/dL (ref 13.0–17.0)

## 2024-05-30 MED ORDER — SODIUM CHLORIDE 0.9 % IV SOLN
1.0000 g | INTRAVENOUS | Status: DC
  Administered 2024-05-30 – 2024-06-01 (×3): 1 g via INTRAVENOUS
  Filled 2024-05-30 (×3): qty 10

## 2024-05-30 MED ORDER — KETOROLAC TROMETHAMINE 15 MG/ML IJ SOLN
15.0000 mg | Freq: Four times a day (QID) | INTRAMUSCULAR | Status: DC | PRN
  Administered 2024-05-30 – 2024-06-01 (×3): 15 mg via INTRAVENOUS
  Filled 2024-05-30 (×3): qty 1

## 2024-05-30 MED ORDER — PANTOPRAZOLE SODIUM 40 MG IV SOLR
40.0000 mg | Freq: Two times a day (BID) | INTRAVENOUS | Status: DC
  Administered 2024-05-30 – 2024-06-01 (×5): 40 mg via INTRAVENOUS
  Filled 2024-05-30 (×5): qty 10

## 2024-05-30 MED ORDER — ORAL CARE MOUTH RINSE
15.0000 mL | OROMUCOSAL | Status: DC | PRN
Start: 2024-05-30 — End: 2024-06-01

## 2024-05-30 MED ORDER — LEVALBUTEROL HCL 0.63 MG/3ML IN NEBU
0.6300 mg | INHALATION_SOLUTION | Freq: Three times a day (TID) | RESPIRATORY_TRACT | Status: DC
  Administered 2024-05-30 – 2024-05-31 (×3): 0.63 mg via RESPIRATORY_TRACT
  Filled 2024-05-30 (×3): qty 3

## 2024-05-30 NOTE — Plan of Care (Signed)

## 2024-05-30 NOTE — Hospital Course (Signed)
 Lawrence [name] is a 68 y.o. male with medical history significant of CAD status post CABG and stenting, chronic combined HFrEF and HFpEF with LVEF 30-35%, PAF on Eliquis , COPD, gout, presented with multiple complaints about worsening of cough wheezing shortness of breath as well as coughing versus throwing up blood.  Upon arriving to hospital, heart rate was 101, he was placed on 2 L oxygen  for short of breath, not documented hypoxemia.  No leukocytosis. Chest CT scan showed right lower lobe pneumonia.  Patient treated with antibiotics.

## 2024-05-30 NOTE — Progress Notes (Signed)
 Progress Note   Patient: Lawrence [name] FMW:[id number] DOB: 10/15/1956 DOA: 05/29/2024     0 DOS: the patient was seen and examined on 05/30/2024   Brief hospital course: DAKAI BRAITHWAITE is a 68 y.o. male with medical history significant of CAD status post CABG and stenting, chronic combined HFrEF and HFpEF with LVEF 30-35%, PAF on Eliquis , COPD, gout, presented with multiple complaints about worsening of cough wheezing shortness of breath as well as coughing versus throwing up blood.  Upon arriving to hospital, heart rate was 101, he was placed on 2 L oxygen  for short of breath, not documented hypoxemia.  No leukocytosis. Chest CT scan showed right lower lobe pneumonia.  Patient treated with antibiotics.    Principal Problem:   Afib (HCC) Active Problems:   Chronic combined systolic (congestive) and diastolic (congestive) heart failure (HCC)   COPD with acute exacerbation (HCC)   Atrial fibrillation, chronic (HCC)   RLL pneumonia   Paroxysmal atrial fibrillation with RVR (HCC)   Assessment and Plan: COPD exacerbation. Right lower lobe pneumonia. Patient had significant bronchospasm at time of admission, has been treated with steroids.  Chest CT scan showed right lower lobe pneumonia, will continue antibiotics and changed to Rocephin  and azithromycin .  Patient does not have dysphagia, no risk for aspiration. Patient was placed on 2 L oxygen , but no documented hypoxemia. Continue to follow closely.  Paroxysmal atrial fibrillation with RVR. Initially treated with Cardizem  drip, condition has improved.  Resumed beta-blocker. Eliquis  on hold due to bleeding.  Hemoptysis versus hematemesis. Patient had a tablespoon of blood, of his mouth in the early morning.  He states that he felt something in his throat, then he tried to vomit, that is when the blood came out.  No additional bleeding since arriving to hospital. He also has history of peptic ulcer disease, I placed on Protonix .   Patient has not had any black stool. I will hold Eliquis  for now.  May restart if no additional bleeding.  Chronic combined diastolic and systolic congestive heart failure. Coronary artery disease. Patient BNP was moderately elevated on 7/12, patient had shortness of breath, will recheck a BNP before giving Lasix .  Type 2 diabetes Hold metformin , sliding scale insulin .     Subjective:  Some short of breath and wheezing today.  No more bleeding.  No nausea vomiting.  Physical Exam: Vitals:   05/30/24 0358 05/30/24 0743 05/30/24 0816 05/30/24 1139  BP: 127/83  125/73 108/76  Pulse: 74  72 97  Resp: 18  18 16   Temp:   98.2 F (36.8 C) (!) 97.5 F (36.4 C)  TempSrc:      SpO2: 93% 95% 95% 93%  Weight:      Height:       General exam: Appears calm and comfortable  Respiratory system: Clear to auscultation. Respiratory effort normal. Cardiovascular system: Irregular. No JVD, murmurs, rubs, gallops or clicks. No pedal edema. Gastrointestinal system: Abdomen is nondistended, soft and nontender. No organomegaly or masses felt. Normal bowel sounds heard. Central nervous system: Alert and oriented. No focal neurological deficits. Extremities: Symmetric 5 x 5 power. Skin: No rashes, lesions or ulcers Psychiatry:Mood & affect appropriate.    Data Reviewed:  Reviewed CT scan results, chest x-ray results, lab results.  Family Communication: None  Disposition: Status is: Inpatient Remains inpatient appropriate because: Severity of disease, IV treatment.     Time spent: 35 minutes  Author: Murvin Mana, MD 05/30/2024 2:55 PM  For on call review [email or web address].

## 2024-05-31 DIAGNOSIS — J441 Chronic obstructive pulmonary disease with (acute) exacerbation: Secondary | ICD-10-CM | POA: Diagnosis not present

## 2024-05-31 DIAGNOSIS — J189 Pneumonia, unspecified organism: Secondary | ICD-10-CM | POA: Diagnosis not present

## 2024-05-31 LAB — GLUCOSE, CAPILLARY
Glucose-Capillary: 176 mg/dL — ABNORMAL HIGH (ref 70–99)
Glucose-Capillary: 196 mg/dL — ABNORMAL HIGH (ref 70–99)
Glucose-Capillary: 152 mg/dL — ABNORMAL HIGH (ref 70–99)
Glucose-Capillary: 156 mg/dL — ABNORMAL HIGH (ref 70–99)

## 2024-05-31 LAB — HEMOGLOBIN
Hemoglobin: 14.6 g/dL (ref 13.0–17.0)
Hemoglobin: 15.1 g/dL (ref 13.0–17.0)

## 2024-05-31 MED ORDER — LEVALBUTEROL HCL 0.63 MG/3ML IN NEBU
0.6300 mg | INHALATION_SOLUTION | Freq: Two times a day (BID) | RESPIRATORY_TRACT | Status: DC
  Administered 2024-05-31 – 2024-06-01 (×2): 0.63 mg via RESPIRATORY_TRACT
  Filled 2024-05-31 (×2): qty 3

## 2024-05-31 MED ORDER — APIXABAN 5 MG PO TABS
5.0000 mg | ORAL_TABLET | Freq: Two times a day (BID) | ORAL | Status: DC
  Administered 2024-05-31 – 2024-06-01 (×2): 5 mg via ORAL
  Filled 2024-05-31 (×2): qty 1

## 2024-05-31 NOTE — Progress Notes (Addendum)
 Progress Note    VITALI SEIBERT  FMW:[id number] DOB: September 17, 1956  DOA: 05/29/2024 PCP: [name], MD      Brief Narrative:    Medical records reviewed and are as summarized below:  BARAA TUBBS is a 68 y.o. male  with medical history significant of CAD status post CABG and stenting, chronic combined HFrEF and HFpEF with LVEF 30-35%, PAF on Eliquis , COPD, gout, recent visit to the ED on 05/27/2024 for chest pain and cough and was diagnosed with pneumonia and discharged on Augmentin . On 05/29/2024, he presented to the hospital with cough, wheezing, shortness of breath and throwing up blood.  He said he threw up small amount of blood after coughing repeatedly.  In the ED, temperature was 98.3 F, heart rate of 104, BP 108/75, O2 sat 96% on room air. Chest CT scan showed right lower lobe pneumonia.  He was admitted to the hospital for IV antibiotics for community-acquired pneumoniaAnd COPD exacerbation.     Assessment/Plan:   Principal Problem:   Afib (HCC) Active Problems:   Chronic combined systolic (congestive) and diastolic (congestive) heart failure (HCC)   COPD with acute exacerbation (HCC)   Atrial fibrillation, chronic (HCC)   RLL pneumonia   Paroxysmal atrial fibrillation with RVR (HCC)   Body mass index is 27.88 kg/m.   COPD exacerbation. Right lower lobe pneumonia. Continue IV ceftriaxone  and azithromycin . Continue IV Solu-Medrol  Of note, patient was seen in the ED on 05/27/2024, prior to this current admission, for chest pain and cough.  He was discharged on Augmentin  for possible pneumonia.    Paroxysmal atrial fibrillation with RVR. Rate has improved.  Required IV Cardizem  drip ordered for a brief period. Continue metoprolol .  Resume Eliquis     Hemoptysis versus hematemesis. Patient said he coughed up/vomited small amount of blood after coughing repeatedly on the day of admission (05/29/2024). No further bleeding reported Continue  Protonix     Chronic combined diastolic and systolic congestive heart failure. Coronary artery disease. BNP 483.4 2D echo in September 2024 showed EF estimated at 30 to 35%, moderate concentric LVH, grade 2 diastolic dysfunction, severely reduced RV systolic function, severely dilated left atrium, mildly dilated right atrium, moderate mitral regurgitation.    Type 2 diabetes mellitus with hyperglycemia NovoLog  as needed for hyperglycemia.    Chronic hypoxic respiratory failure: Patient says he uses 2 L oxygen  at home as needed          Diet Order             Diet heart healthy/carb modified Room service appropriate? Yes; Fluid consistency: Thin  Diet effective now                            Consultants: None  Procedures: None    Medications:    allopurinol   300 mg Oral BID   alum & mag hydroxide-simeth  30 mL Oral TID AC & HS   atorvastatin   80 mg Oral QHS   azithromycin   500 mg Oral Daily   budesonide -glycopyrrolate -formoterol   2 puff Inhalation BID   carvedilol   3.125 mg Oral BID   famotidine   20 mg Oral BID   gabapentin   300 mg Oral BID   guaiFENesin   1,200 mg Oral BID   insulin  aspart  0-15 Units Subcutaneous TID WC   insulin  aspart  0-5 Units Subcutaneous QHS   isosorbide  mononitrate  120 mg Oral Daily   levalbuterol   0.63  mg Nebulization BID   lithium  carbonate  150 mg Oral BID WC   loratadine   10 mg Oral Daily   methylPREDNISolone  (SOLU-MEDROL ) injection  40 mg Intravenous Q12H   metoCLOPramide   10 mg Oral TID AC & HS   pantoprazole  (PROTONIX ) IV  40 mg Intravenous Q12H   sacubitril -valsartan   1 tablet Oral BID   spironolactone   25 mg Oral Daily   sucralfate   1 g Oral QID   vortioxetine  HBr  10 mg Oral Daily   Continuous Infusions:  cefTRIAXone  (ROCEPHIN )  IV 1 g (05/31/24 0935)     Anti-infectives (From admission, onward)    Start     Dose/Rate Route Frequency Ordered Stop   05/30/24 1000  cefTRIAXone  (ROCEPHIN ) 1 g in sodium  chloride 0.9 % 100 mL IVPB        1 g 200 mL/hr over 30 Minutes Intravenous Every 24 hours 05/30/24 0858     05/29/24 2200  amoxicillin -clavulanate (AUGMENTIN ) 875-125 MG per tablet 1 tablet  Status:  Discontinued        1 tablet Oral 2 times daily 05/29/24 1334 05/30/24 0858   05/29/24 1415  azithromycin  (ZITHROMAX ) tablet 500 mg        500 mg Oral Daily 05/29/24 1412     05/29/24 1115  amoxicillin -clavulanate (AUGMENTIN ) 875-125 MG per tablet 1 tablet        1 tablet Oral  Once 05/29/24 1100 05/29/24 1106              Family Communication/Anticipated D/C date and plan/Code Status   DVT prophylaxis:      Code Status: Limited: Do not attempt resuscitation (DNR) -DNR-LIMITED -Do Not Intubate/DNI   Family Communication: None Disposition Plan: Plans discharge home   Status is: Inpatient Remains inpatient appropriate because: Pneumonia       Subjective:   Interval events noted.  He complains of sternal chest pain, cough but no bloody sputum.  He still feels a little short of breath.  No wheezing  Objective:    Vitals:   05/31/24 0807 05/31/24 0808 05/31/24 0909 05/31/24 1131  BP: (!) 134/92 (!) 134/92 (!) 134/92 107/80  Pulse: 77 80 80 91  Resp:      Temp: 97.7 F (36.5 C) 97.7 F (36.5 C)  97.8 F (36.6 C)  TempSrc: Oral Oral    SpO2: 98% 99%  91%  Weight:      Height:       No data found.   Intake/Output Summary (Last 24 hours) at 05/31/2024 1535 Last data filed at 05/31/2024 1437 Gross per 24 hour  Intake 1380 ml  Output 3925 ml  Net -2545 ml   Filed Weights   05/29/24 0930  Weight: 80.7 kg    Exam:  GEN: NAD SKIN: Warm and dry EYES: No pallor or icterus ENT: MMM CV: Irregular rate and rhythm PULM: Bilateral rhonchi.  No rales heard ABD: soft, ND, NT, +BS CNS: AAO x 3, non focal EXT: No edema or tenderness        Data Reviewed:   I have personally reviewed following labs and imaging studies:  Labs: Labs show the following:    Basic Metabolic Panel: Recent Labs  Lab 05/27/24 1403 05/29/24 0937 05/30/24 0323  NA 137 139 140  K 4.3 4.1 4.4  CL 101 100 104  CO2 27 27 26   GLUCOSE 164* 137* 155*  BUN 22 15 20   CREATININE 1.18 0.99 1.01  CALCIUM  9.1 9.5 9.3  MG  --  1.9  --    GFR Estimated Creatinine Clearance: 69.2 mL/min (by C-G formula based on SCr of 1.01 mg/dL). Liver Function Tests: Recent Labs  Lab 05/27/24 1403 05/29/24 0937  AST 24 19  ALT 21 20  ALKPHOS 53 50  BILITOT 1.5* 1.8*  PROT 6.5 6.5  ALBUMIN 3.6 3.7   Recent Labs  Lab 05/27/24 1403 05/29/24 0937  LIPASE 36 35   No results for input(s): AMMONIA in the last 168 hours. Coagulation profile No results for input(s): INR, PROTIME in the last 168 hours.  CBC: Recent Labs  Lab 05/27/24 1403 05/29/24 0937 05/29/24 2115 05/30/24 0323 05/30/24 1640 05/31/24 0536  WBC 10.2 8.7  --  4.6  --   --   NEUTROABS  --  7.4  --   --   --   --   HGB 15.9 15.0 13.9 14.0 15.2 14.6  HCT 51.0 48.3 43.9 44.0  --   --   MCV 89.6 90.3  --  88.9  --   --   PLT 120* 106*  --  102*  --   --    Cardiac Enzymes: Recent Labs  Lab 05/27/24 1403  CKTOTAL 143   BNP (last 3 results) No results for input(s): PROBNP in the last 8760 hours. CBG: Recent Labs  Lab 05/30/24 1141 05/30/24 1634 05/30/24 2127 05/31/24 0808 05/31/24 1134  GLUCAP 176* 201* 124* 176* 196*   D-Dimer: No results for input(s): DDIMER in the last 72 hours. Hgb A1c: No results for input(s): HGBA1C in the last 72 hours. Lipid Profile: No results for input(s): CHOL, HDL, LDLCALC, TRIG, CHOLHDL, LDLDIRECT in the last 72 hours. Thyroid  function studies: No results for input(s): TSH, T4TOTAL, T3FREE, THYROIDAB in the last 72 hours.  Invalid input(s): FREET3 Anemia work up: No results for input(s): VITAMINB12, FOLATE, FERRITIN, TIBC, IRON , RETICCTPCT in the last 72 hours. Sepsis Labs: Recent Labs  Lab 05/27/24 1403  05/29/24 0937 05/29/24 1143 05/30/24 0323  WBC 10.2 8.7  --  4.6  LATICACIDVEN  --  1.4 1.6  --     Microbiology Recent Results (from the past 240 hours)  Blood culture (single)     Status: None (Preliminary result)   Collection Time: 05/29/24  9:37 AM   Specimen: BLOOD RIGHT FOREARM  Result Value Ref Range Status   Specimen Description BLOOD RIGHT FOREARM  Final   Special Requests   Final    BOTTLES DRAWN AEROBIC AND ANAEROBIC Blood Culture adequate volume   Culture   Final    NO GROWTH 2 DAYS Performed at [facility], [address] [place], [place] [address]    Report Status PENDING  Incomplete    Procedures and diagnostic studies:  No results found.             LOS: 1 day   Perfecto Purdy  [facility]   Pager on [email or web address]. If 7PM-7AM, please contact night-coverage at [email or web address]     05/31/2024, 3:35 PM

## 2024-06-01 ENCOUNTER — Encounter: Payer: Self-pay | Admitting: Oncology

## 2024-06-01 ENCOUNTER — Other Ambulatory Visit: Payer: Self-pay

## 2024-06-01 DIAGNOSIS — J441 Chronic obstructive pulmonary disease with (acute) exacerbation: Secondary | ICD-10-CM | POA: Diagnosis not present

## 2024-06-01 DIAGNOSIS — J189 Pneumonia, unspecified organism: Secondary | ICD-10-CM | POA: Diagnosis not present

## 2024-06-01 LAB — HEMOGLOBIN: Hemoglobin: 15.5 g/dL (ref 13.0–17.0)

## 2024-06-01 LAB — GLUCOSE, CAPILLARY: Glucose-Capillary: 170 mg/dL — ABNORMAL HIGH (ref 70–99)

## 2024-06-01 MED ORDER — DOXYCYCLINE HYCLATE 100 MG PO TABS: 100.0000 mg | ORAL_TABLET | Freq: Two times a day (BID) | ORAL | Status: AC

## 2024-06-01 MED ORDER — PREDNISONE 20 MG PO TABS
40.0000 mg | ORAL_TABLET | Freq: Every day | ORAL | 0 refills | Status: AC
  Filled 2024-06-01: qty 4, 2d supply, fill #0

## 2024-06-01 MED ORDER — SENNA-DOCUSATE SODIUM 8.6-50 MG PO TABS: 1.0000 | ORAL_TABLET | Freq: Every day | ORAL | Status: DC | PRN

## 2024-06-01 MED ORDER — AMOXICILLIN-POT CLAVULANATE 875-125 MG PO TABS: 1.0000 | ORAL_TABLET | Freq: Two times a day (BID) | ORAL | Status: AC

## 2024-06-01 MED ORDER — ALUMINUM-MAGNESIUM-SIMETHICONE 200-200-20 MG/5ML PO SUSP: 30.0000 mL | Freq: Three times a day (TID) | ORAL | Status: DC | PRN

## 2024-06-01 NOTE — Care Management Important Message (Signed)
 Important Message  Patient Details  Name: Lawrence [name] MRN: [id number] Date of Birth: [birth year]   Important Message Given:  Yes - Medicare IM     [name] 06/01/2024, 1:34 PM

## 2024-06-01 NOTE — Progress Notes (Signed)
 1030: Discharge instructions complete and reviewed with patient. Patient verbalizes understanding of instructions given. Discharging to d/c lounge. VSS. No distress.

## 2024-06-01 NOTE — Discharge Summary (Signed)
 Physician Discharge Summary   Patient: Lawrence [name] MRN: [id number] DOB: August 09, 1956  Admit date:     05/29/2024  Discharge date: 06/01/24  Discharge Physician: AIDA CHO   PCP: Lawrence Nancyann BRAVO, MD   Recommendations at discharge:   Follow-up with PCP in 1 week  Discharge Diagnoses: Principal Problem:   Afib (HCC) Active Problems:   Chronic combined systolic (congestive) and diastolic (congestive) heart failure (HCC)   COPD with acute exacerbation (HCC)   Atrial fibrillation, chronic (HCC)   RLL pneumonia   Paroxysmal atrial fibrillation with RVR (HCC)  Resolved Problems:   * No resolved hospital problems. *  Hospital Course:  Lawrence [name] is a 68 y.o. male  with medical history significant of CAD status post CABG and stenting, chronic combined HFrEF and HFpEF with LVEF 30-35%, PAF on Eliquis , COPD, gout, recent visit to the ED on 05/27/2024 for chest pain and cough and was diagnosed with pneumonia and discharged on Augmentin . On 05/29/2024, he presented to the hospital with cough, wheezing, shortness of breath and throwing up blood.  He said he threw up small amount of blood after coughing repeatedly.   In the ED, temperature was 98.3 F, heart rate of 104, BP 108/75, O2 sat 96% on room air. Chest CT scan showed right lower lobe pneumonia.  He was admitted to the hospital for IV antibiotics for community-acquired pneumoniaAnd COPD exacerbation.    Assessment and Plan:  COPD exacerbation. Right lower lobe pneumonia. Improved.  S/p treatment with IV ceftriaxone , azithromycin  and IV Solu-Medrol . He will be discharged on Augmentin  and doxycycline .  He said he only took 2 days worth of Augmentin  and doxycycline  prior to coming to the ED and he still has plenty of pills left.  He has been advised to continue his antibiotics for 4 more days. Will also be discharged on prednisone  for 2 more days.  He said he has tolerated prednisone  in the past. Initially, they thought  prednisone  was putting him in A-fib but he said I stay in A-fib all the time.  He was later discovered that prednisone  was not causing any problems.  Prednisone  allergy has been removed from his allergy list.   Of note, patient was seen in the ED on 05/27/2024, prior to this current admission, for chest pain and cough.  He was discharged on Augmentin  for possible pneumonia.     Paroxysmal atrial fibrillation with RVR. Rate has improved.  Required IV Cardizem  drip ordered for a brief period. Continue metoprolol  and Eliquis .     Hemoptysis versus hematemesis. Patient said he coughed up/vomited small amount of blood after coughing repeatedly on the day of admission (05/29/2024). No further bleeding reported Continue Protonix  Hemoglobin stable at 15.5   Chronic combined diastolic and systolic congestive heart failure. Coronary artery disease. Compensated.  He said he only takes Lasix  as needed at home. BNP 483.4 2D echo in September 2024 showed EF estimated at 30 to 35%, moderate concentric LVH, grade 2 diastolic dysfunction, severely reduced RV systolic function, severely dilated left atrium, mildly dilated right atrium, moderate mitral regurgitation.     Type 2 diabetes mellitus with hyperglycemia Continue metformin  and Farxiga  at discharge     Chronic hypoxic respiratory failure: Patient says he uses 2 L oxygen  at home as needed     His condition has improved and he is deemed stable for discharge to home today.  We went over all his discharge medications.  He was able to tell me that he says  that he still takes and the ones he is no longer taking.       Pain control - [place]  Controlled Substance Reporting System database was reviewed. and patient was instructed, not to drive, operate heavy machinery, perform activities at heights, swimming or participation in water activities or provide baby-sitting services while on Pain, Sleep and Anxiety Medications; until their  outpatient Physician has advised to do so again. Also recommended to not to take more than prescribed Pain, Sleep and Anxiety Medications.  Consultants: None Procedures performed: None  Disposition: Home Diet recommendation:  Discharge Diet Orders (From admission, onward)     Start     Ordered   06/01/24 0000  Diet - low sodium heart healthy        06/01/24 0923   06/01/24 0000  Diet Carb Modified        06/01/24 0923           Cardiac and Carb modified diet DISCHARGE MEDICATION: Allergies as of 06/01/2024       Reactions   Demerol  [meperidine Hcl]    Demerol [meperidine] Hives   Sulfa Antibiotics Hives   Empagliflozin  Other (See Comments), Hives   Perineal pain Other Reaction(s): Other (See Comments)   Morphine  Sulfate Nausea And Vomiting, Rash, Other (See Comments)   Pt states that he is only allergic to the tablet form of this medication.          Medication List     STOP taking these medications    sucralfate  1 g tablet Commonly known as: Carafate    sucralfate  1 GM/10ML suspension Commonly known as: Carafate    venlafaxine  25 MG tablet Commonly known as: EFFEXOR        TAKE these medications    Accu-Chek Guide Control Liqd Use with blood glucose monitor as directed   Accu-Chek Guide test strip Generic drug: glucose blood Use as instructed to check sugar daily for type 2 diabetes.   Accu-Chek Guide w/Device Kit Use to check blood sugars as directed   Accu-Chek Softclix Lancets lancets Use as instructed to check sugar daily for type 2 diabetes.   albuterol  (2.5 MG/3ML) 0.083% nebulizer solution Commonly known as: PROVENTIL  Take 3 mLs (2.5 mg total) by nebulization every 6 (six) hours as needed for wheezing or shortness of breath.   allopurinol  300 MG tablet Commonly known as: ZYLOPRIM  TAKE 1 TABLET BY MOUTH TWICE A DAY   ALPRAZolam  1 MG tablet Commonly known as: XANAX  TAKE 1 TABLET BY MOUTH 3 TIMES A DAY AS NEEDED   aluminum -magnesium   hydroxide-simethicone  200-200-20 MG/5ML Susp Commonly known as: MAALOX Take 30 mLs by mouth 3 (three) times daily as needed. What changed:  when to take this reasons to take this   amLODipine  10 MG tablet Commonly known as: NORVASC  Take 10 mg by mouth daily.   amoxicillin -clavulanate 875-125 MG tablet Commonly known as: AUGMENTIN  Take 1 tablet by mouth 2 (two) times daily for 4 days.   apixaban  5 MG Tabs tablet Commonly known as: Eliquis  Take 1 tablet (5 mg total) by mouth 2 (two) times daily.   atorvastatin  80 MG tablet Commonly known as: LIPITOR  TAKE 1 TABLET BY MOUTH AT BEDTIME   Breztri  Aerosphere 160-9-4.8 MCG/ACT Aero inhaler Generic drug: budesonide -glycopyrrolate -formoterol  Inhale 2 puffs into the lungs 2 (two) times daily.   carvedilol  3.125 MG tablet Commonly known as: COREG  TAKE 1 TABLET BY MOUTH TWICE A DAY   cetirizine  10 MG tablet Commonly known as: ZYRTEC  Take 1 tablet (10 mg  total) by mouth at bedtime.   doxycycline  100 MG tablet Commonly known as: VIBRA -TABS Take 1 tablet (100 mg total) by mouth 2 (two) times daily for 4 days.   famotidine  20 MG tablet Commonly known as: PEPCID  Take 1 tablet (20 mg total) by mouth 2 (two) times daily.   Farxiga  10 MG Tabs tablet Generic drug: dapagliflozin  propanediol TAKE 1 TABLET BY MOUTH DAILY   Ferrex 150 150 MG capsule Generic drug: iron  polysaccharides TAKE 1 CAPSULE BY MOUTH DAILY   furosemide  20 MG tablet Commonly known as: LASIX  Take 1 tablet (20 mg total) by mouth daily as needed.   gabapentin  300 MG capsule Commonly known as: NEURONTIN  Take 1 capsule (300 mg total) by mouth 2 (two) times daily.   hydrOXYzine  25 MG capsule Commonly known as: VISTARIL  Take 1 capsule (25 mg total) by mouth 3 (three) times daily as needed.   isosorbide  mononitrate 120 MG 24 hr tablet Commonly known as: IMDUR  Take 1 tablet (120 mg total) by mouth daily. What changed: Another medication with the same name was  removed. Continue taking this medication, and follow the directions you see here.   lithium  carbonate 150 MG capsule Take 150 mg by mouth 2 (two) times daily.   metFORMIN  500 MG 24 hr tablet Commonly known as: GLUCOPHAGE -XR TAKE 1 TABLET BY MOUTH DAILY WITH EVENING MEAL   methocarbamol  500 MG tablet Commonly known as: ROBAXIN  TAKE 1 TABLET BY MOUTH EVERY 8 HOURS AS NEEDED FOR MUSCLE SPASMS   metoCLOPramide  10 MG tablet Commonly known as: REGLAN  Take 1 tablet (10 mg total) by mouth 4 (four) times daily -  before meals and at bedtime for 15 days.   metoprolol  succinate 25 MG 24 hr tablet Commonly known as: TOPROL -XL Take 12.5 mg by mouth daily.   metroNIDAZOLE  1 % gel Commonly known as: METROGEL  Apply topically daily.   nitroGLYCERIN  0.4 MG SL tablet Commonly known as: NITROSTAT  Place 1 tablet (0.4 mg total) under the tongue every 5 (five) minutes as needed for chest pain (Do not take if blood pressure is low, systolic BP<110).   omega-3 acid ethyl esters 1 g capsule Commonly known as: LOVAZA  TAKE 4 CAPSULES (4 GRAMS TOTAL) BY MOUTHDAILY   oxyCODONE -acetaminophen  10-325 MG tablet Commonly known as: PERCOCET TAKE 1 TABLET BY MOUTH EVERY 4 HOURS AS NEEDED FOR PAIN   OXYGEN  Inhale 2 L into the lungs at bedtime as needed (for shortness of breath).   pantoprazole  40 MG tablet Commonly known as: PROTONIX  TAKE 1 TABLET BY MOUTH 2 TIMES DAILY (30MINUTES BEFORE A MEAL)   predniSONE  20 MG tablet Commonly known as: DELTASONE  Take 2 tablets (40 mg total) by mouth daily for 2 days. Start taking on: June 02, 2024   ranolazine  1000 MG SR tablet Commonly known as: RANEXA  Take 1 tablet (1,000 mg total) by mouth 2 (two) times daily.   sacubitril -valsartan  24-26 MG Commonly known as: ENTRESTO  Take 1 tablet by mouth 2 (two) times daily.   sennosides-docusate sodium  8.6-50 MG tablet Commonly known as: SENOKOT-S Take 1 tablet by mouth daily as needed for constipation. What  changed:  when to take this reasons to take this   spironolactone  25 MG tablet Commonly known as: ALDACTONE  Take 1 tablet (25 mg total) by mouth daily.   traZODone  150 MG tablet Commonly known as: DESYREL  Take 1 tablet (150 mg total) by mouth at bedtime as needed for sleep.   Trintellix  10 MG Tabs tablet Generic drug: vortioxetine  HBr Take 10  mg by mouth daily.   Vitamin B-12 1000 MCG Subl Place 1 tablet under the tongue daily.        Discharge Exam: Filed Weights   05/29/24 0930  Weight: 80.7 kg   GEN: NAD SKIN: Warm and dry EYES: No pallor or icterus ENT: MMM CV: RRR PULM: CTA B ABD: soft, ND, NT, +BS CNS: AAO x 3, non focal EXT: No edema or tenderness   Condition at discharge: good  The results of significant diagnostics from this hospitalization (including imaging, microbiology, ancillary and laboratory) are listed below for reference.   Imaging Studies: CT ABDOMEN PELVIS W CONTRAST Result Date: 05/29/2024 CLINICAL DATA:  Abdominal pain, emesis, hematemesis EXAM: CT ABDOMEN AND PELVIS WITH CONTRAST TECHNIQUE: Multidetector CT imaging of the abdomen and pelvis was performed using the standard protocol following bolus administration of intravenous contrast. RADIATION DOSE REDUCTION: This exam was performed according to the departmental dose-optimization program which includes automated exposure control, adjustment of the mA and/or kV according to patient size and/or use of iterative reconstruction technique. CONTRAST:  OMNIPAQUE  IOHEXOL  300 MG/ML  SOLN COMPARISON:  May 27, 2024 CTA FINDINGS: lower chest: Redemonstration of opacities at the visualized right lung base. Hepatobiliary: No suspicious liver lesions. Status post cholecystectomy. No biliary ductal dilation. Pancreas: Unremarkable. Spleen: Unremarkable. Adrenals/Urinary Tract: Unchanged mild bilateral adrenal gland nodularity. No nephrolithiasis or hydronephrosis. Stomach/Bowel: No evidence of bowel  obstruction or inflammation. Appendix is unremarkable. Small hiatal hernia. Vascular/Lymphatic: Normal caliber aorta. No lymphadenopathy by size criteria. Reproductive: Unremarkable. Other: No free air or free fluid. Musculoskeletal: No acute osseous findings. IMPRESSION: No acute pathology in the abdomen or pelvis. Redemonstration of likely pneumonia involving the partially imaged right lower lung. Electronically Signed   By: Michaeline Blanch M.D.   On: 05/29/2024 12:37   DG Chest Portable 1 View Result Date: 05/29/2024 CLINICAL DATA:  COPD.  Recent pneumonia.  Shortness of breath EXAM: PORTABLE CHEST 1 VIEW COMPARISON:  X-ray 05/27/2024. FINDINGS: Hyperinflation. Chronic interstitial changes. Normal cardiopericardial silhouette with sternal wires. Coronary artery stents are suggested. Calcifications towards the mitral valve annulus. On the prior there is some hazy ill-defined opacity right upper lung which show some improvement with some residual. Recommend continued follow-up. No pneumothorax or effusion. No edema. IMPRESSION: Slight decrease in the hazy opacity right upper lung with some residual. Recommend continued follow-up to confirm resolution. Electronically Signed   By: Ranell Bring M.D.   On: 05/29/2024 10:14   CT Angio Chest/Abd/Pel for Dissection W and/or Wo Contrast Result Date: 05/27/2024 CLINICAL DATA:  Acute aortic syndrome suspected. Chest pain following fall. EXAM: CT ANGIOGRAPHY CHEST, ABDOMEN AND PELVIS TECHNIQUE: Non-contrast CT of the chest was initially obtained. Multidetector CT imaging through the chest, abdomen and pelvis was performed using the standard protocol during bolus administration of intravenous contrast. Multiplanar reconstructed images and MIPs were obtained and reviewed to evaluate the vascular anatomy. RADIATION DOSE REDUCTION: This exam was performed according to the departmental dose-optimization program which includes automated exposure control, adjustment of the mA  and/or kV according to patient size and/or use of iterative reconstruction technique. CONTRAST:  OMNIPAQUE  IOHEXOL  350 MG/ML SOLN COMPARISON:  03/17/2024. FINDINGS: CTA CHEST FINDINGS Cardiovascular: The heart is stable in size and multi-vessel coronary artery calcifications are noted. There is atherosclerotic calcification of the aorta without evidence of aneurysm. No dissection or intramural hematoma is seen. The pulmonary trunk is mildly distended suggesting underlying pulmonary artery hypertension. Mediastinum/Nodes: A prominent lymph node is noted in the subcarinal space  measuring up to 1.1 cm. Prominent lymph nodes are present at the right hilum measuring up to 1 cm. No axillary lymphadenopathy is seen. The thyroid  gland, trachea, and esophagus are within normal limits. There is a small hiatal hernia. Lungs/Pleura: Scattered airspace opacities are noted in the right lung with mild consolidation in the right upper lobe. No pleural effusion or pneumothorax is seen. Musculoskeletal: Sternotomy wires are noted. Degenerative changes are present in the thoracic spine. No acute fracture is seen. Review of the MIP images confirms the above findings. CTA ABDOMEN AND PELVIS FINDINGS VASCULAR Aorta: Normal caliber aorta without aneurysm, dissection, vasculitis or significant stenosis. Aortic atherosclerosis. Celiac: Patent without evidence of aneurysm, dissection, vasculitis or significant stenosis. SMA: Patent without evidence of aneurysm, dissection, vasculitis or significant stenosis. Renals: Both renal arteries are patent without evidence of aneurysm, dissection, vasculitis, fibromuscular dysplasia or significant stenosis. IMA: Patent without evidence of aneurysm, dissection, vasculitis or significant stenosis. Inflow: Patent without evidence of aneurysm, dissection, vasculitis or significant stenosis. Veins: No obvious venous abnormality within the limitations of this arterial phase study. Review of the MIP  images confirms the above findings. NON-VASCULAR Hepatobiliary: No focal liver abnormality is seen. Status post cholecystectomy. No biliary dilatation. Pancreas: Unremarkable. No pancreatic ductal dilatation or surrounding inflammatory changes. Spleen: Normal in size without focal abnormality. Adrenals/Urinary Tract: The adrenal glands are within normal limits. The kidneys enhance symmetrically. No renal calculus or hydronephrosis bilaterally. There is diffuse bladder wall thickening. Stomach/Bowel: There is a small hiatal hernia. The stomach is otherwise within normal limits. No bowel obstruction, free air, or pneumatosis. Scattered diverticula are present along the colon without evidence of diverticulitis. Appendix appears normal. Lymphatic: No abdominal or pelvic lymphadenopathy. Reproductive: Prostate is unremarkable. Other: No abdominopelvic ascites. A fat containing inguinal hernia is noted on the left. Musculoskeletal: Degenerative changes are present in the lumbar spine. No acute fracture is seen. Review of the MIP images confirms the above findings. IMPRESSION: 1. Aortic atherosclerosis with no evidence of aortic aneurysm or dissection. 2. Scattered airspace opacities in the right lung, suspicious for pneumonia. 3. Diffuse bladder wall thickening, possible infectious or inflammatory cystitis. 4. Diverticulosis without diverticulitis. 5. Coronary artery calcifications. Electronically Signed   By: Leita Birmingham M.D.   On: 05/27/2024 16:08   CT Maxillofacial Wo Contrast Result Date: 05/27/2024 CLINICAL DATA:  Facial trauma, blunt EXAM: CT MAXILLOFACIAL WITHOUT CONTRAST TECHNIQUE: Multidetector CT imaging of the maxillofacial structures was performed. Multiplanar CT image reconstructions were also generated. RADIATION DOSE REDUCTION: This exam was performed according to the departmental dose-optimization program which includes automated exposure control, adjustment of the mA and/or kV according to patient  size and/or use of iterative reconstruction technique. COMPARISON:  CT the head dated Mar 29, 2024. FINDINGS: Osseous: The facial bones are intact. The patient is edentulous. No bony lesions are present. Orbits: Normal orbits. Sinuses: Minimal mucosal disease within the maxillary and ethmoid sinuses and floor left frontal sinus. Soft tissues: Unremarkable. Limited intracranial: Negative. IMPRESSION: 1. Mild paranasal sinuses disease. No evidence of acute traumatic injury. Electronically Signed   By: Evalene Coho M.D.   On: 05/27/2024 15:53   CT HEAD WO CONTRAST ( ) Result Date: 05/27/2024 CLINICAL DATA:  Headache, new onset (Age >= 51y) EXAM: CT HEAD WITHOUT CONTRAST TECHNIQUE: Contiguous axial images were obtained from the base of the skull through the vertex without intravenous contrast. RADIATION DOSE REDUCTION: This exam was performed according to the departmental dose-optimization program which includes automated exposure control, adjustment of the mA and/or  kV according to patient size and/or use of iterative reconstruction technique. COMPARISON:  CT the head dated Mar 29, 2024. FINDINGS: Brain: Normal brain. No evidence of hemorrhage, mass, cortical infarct or hydrocephalus. Vascular: Negative. Skull: Negative. Sinuses/Orbits: Minimal mucosal disease within the maxillary sinuses. Normal orbits. Other: None. IMPRESSION: Normal. Electronically Signed   By: Evalene Coho M.D.   On: 05/27/2024 15:51   CT Cervical Spine Wo Contrast Result Date: 05/27/2024 CLINICAL DATA:  Bone lesion, cervical spine, incidental EXAM: CT CERVICAL SPINE WITHOUT CONTRAST TECHNIQUE: Multidetector CT imaging of the cervical spine was performed without intravenous contrast. Multiplanar CT image reconstructions were also generated. RADIATION DOSE REDUCTION: This exam was performed according to the departmental dose-optimization program which includes automated exposure control, adjustment of the mA and/or kV according to  patient size and/or use of iterative reconstruction technique. COMPARISON:  CT of the cervical spine dated Mar 29, 2024. FINDINGS: Alignment: Normal. Skull base and vertebrae: Benign lucency within the left lateral aspect of the C3 vertebral body, likely representing cyst or hemangioma. Soft tissues and spinal canal: No paraspinous soft tissue swelling or injury evident. Disc levels: The disc spaces are satisfactory preserved and the spinal canal and neural foramina appear patent. Upper chest: Negative. Other: None. IMPRESSION: 1. Highly likely benign lucent lesion within the C3 vertebral body. Otherwise, negative. Electronically Signed   By: Evalene Coho M.D.   On: 05/27/2024 15:50   DG Chest 2 View Result Date: 05/27/2024 CLINICAL DATA:  Chest pain following fall. EXAM: CHEST - 2 VIEW; RIGHT SHOULDER - 2+ VIEW COMPARISON:  05/18/2024. FINDINGS: Chest: The heart size and mediastinal contours are within normal limits. There is atherosclerotic calcification of the aorta. Scattered airspace opacities are noted in the right lung. No effusion or pneumothorax is seen. Sternotomy wires are present over the midline. Degenerative changes are noted in the thoracic spine. No acute osseous abnormality. Right shoulder: There is no evidence of acute fracture or dislocation. Moderate degenerative changes are noted at the acromioclavicular joint. The humeral head is high riding which may be associated with chronic rotator cuff tear. IMPRESSION: 1. Patchy airspace disease in the right lung, concerning for pneumonia. 2. No acute fracture or dislocation at the right shoulder. 3. Moderate degenerative changes at the glenohumeral and acromioclavicular joints. 4. High riding humeral head on the right which may be associated with chronic rotator cuff tear. Electronically Signed   By: Leita Birmingham M.D.   On: 05/27/2024 14:40   DG Shoulder Right Result Date: 05/27/2024 CLINICAL DATA:  Chest pain following fall. EXAM: CHEST - 2  VIEW; RIGHT SHOULDER - 2+ VIEW COMPARISON:  05/18/2024. FINDINGS: Chest: The heart size and mediastinal contours are within normal limits. There is atherosclerotic calcification of the aorta. Scattered airspace opacities are noted in the right lung. No effusion or pneumothorax is seen. Sternotomy wires are present over the midline. Degenerative changes are noted in the thoracic spine. No acute osseous abnormality. Right shoulder: There is no evidence of acute fracture or dislocation. Moderate degenerative changes are noted at the acromioclavicular joint. The humeral head is high riding which may be associated with chronic rotator cuff tear. IMPRESSION: 1. Patchy airspace disease in the right lung, concerning for pneumonia. 2. No acute fracture or dislocation at the right shoulder. 3. Moderate degenerative changes at the glenohumeral and acromioclavicular joints. 4. High riding humeral head on the right which may be associated with chronic rotator cuff tear. Electronically Signed   By: Leita Birmingham M.D.   On:  05/27/2024 14:40   CT Angio Chest PE W/Cm &/Or Wo Cm Result Date: 05/19/2024 CLINICAL DATA:  Right lower leg pain. Brought in by EMS from home placed on 2 L of oxygen  at night. PE suspected. Chest pain. EXAM: CT ANGIOGRAPHY CHEST WITH CONTRAST TECHNIQUE: Multidetector CT imaging of the chest was performed using the standard protocol during bolus administration of intravenous contrast. Multiplanar CT image reconstructions and MIPs were obtained to evaluate the vascular anatomy. RADIATION DOSE REDUCTION: This exam was performed according to the departmental dose-optimization program which includes automated exposure control, adjustment of the mA and/or kV according to patient size and/or use of iterative reconstruction technique. CONTRAST:  75mL OMNIPAQUE  IOHEXOL  350 MG/ML SOLN COMPARISON:  Radiographs 05/18/2024 and CT 03/17/2024 FINDINGS: Cardiovascular: Negative for pulmonary embolism. Advanced coronary  artery calcification. Aortic atherosclerotic calcification. No pericardial effusion. Sternotomy and CABG. Mediastinum/Nodes: Trachea is unremarkable. Unremarkable esophagus. No thoracic adenopathy. Lungs/Pleura: Mild diffuse bronchial wall thickening. Scattered centrilobular micro nodules in the right upper lobe. No focal consolidation, pleural effusion, or pneumothorax. Upper Abdomen: No acute abnormality. Musculoskeletal: No acute fracture. Review of the MIP images confirms the above findings. IMPRESSION: 1. Negative for pulmonary embolism. 2. Bronchial wall thickening and scattered centrilobular micro nodules compatible with airway infection/inflammation. 3. Advanced coronary artery calcification. 4. Aortic Atherosclerosis (ICD10-I70.0). Electronically Signed   By: Norman Gatlin M.D.   On: 05/19/2024 01:32   US  Venous Img Lower Unilateral Right Result Date: 05/19/2024 CLINICAL DATA:  Right leg pain. EXAM: RIGHT LOWER EXTREMITY VENOUS DOPPLER ULTRASOUND TECHNIQUE: Gray-scale sonography with graded compression, as well as color Doppler and duplex ultrasound were performed to evaluate the lower extremity deep venous systems from the level of the common femoral vein and including the common femoral, femoral, profunda femoral, popliteal and calf veins including the posterior tibial, peroneal and gastrocnemius veins when visible. The superficial great saphenous vein was also interrogated. Spectral Doppler was utilized to evaluate flow at rest and with distal augmentation maneuvers in the common femoral, femoral and popliteal veins. COMPARISON:  None Available. FINDINGS: Contralateral Common Femoral Vein: Respiratory phasicity is normal and symmetric with the symptomatic side. No evidence of thrombus. Normal compressibility. Common Femoral Vein: No evidence of thrombus. Normal compressibility, respiratory phasicity and response to augmentation. Saphenofemoral Junction: No evidence of thrombus. Normal compressibility  and flow on color Doppler imaging. Profunda Femoral Vein: No evidence of thrombus. Normal compressibility and flow on color Doppler imaging. Femoral Vein: No evidence of thrombus. Normal compressibility, respiratory phasicity and response to augmentation. Popliteal Vein: No evidence of thrombus. Normal compressibility, respiratory phasicity and response to augmentation. Calf Veins: No evidence of thrombus. Normal compressibility and flow on color Doppler imaging. Superficial Great Saphenous Vein: No evidence of thrombus. Normal compressibility. Venous Reflux:  None. Other Findings:  None. IMPRESSION: No evidence of deep venous thrombosis of the RIGHT lower extremity. Electronically Signed   By: Suzen Dials M.D.   On: 05/19/2024 00:37   DG Chest 1 View Result Date: 05/19/2024 CLINICAL DATA:  Chest pain EXAM: CHEST  1 VIEW COMPARISON:  03/29/2024 FINDINGS: Sternotomy and CABG. Stable cardiomediastinal silhouette. Coronary stenting. Bronchial wall thickening suspicious for bronchitis. Left basilar atelectasis. Otherwise no focal consolidation. No pleural effusion or pneumothorax. No displaced rib fractures. IMPRESSION: 1. Bronchial wall thickening suspicious for bronchitis. Electronically Signed   By: Norman Gatlin M.D.   On: 05/19/2024 00:13   DG Tibia/Fibula Right Result Date: 05/18/2024 CLINICAL DATA:  Right lower leg pain EXAM: RIGHT TIBIA AND FIBULA - 2 VIEW  COMPARISON:  None Available. FINDINGS: No acute fracture or dislocation. There is no evidence of arthropathy or other focal bone abnormality. Soft tissues are unremarkable. Peripheral vascular atherosclerosis. IMPRESSION: No acute fracture or dislocation. Electronically Signed   By: Rogelia Myers M.D.   On: 05/18/2024 22:34    Microbiology: Results for orders placed or performed during the hospital encounter of 05/29/24  Blood culture (single)     Status: None (Preliminary result)   Collection Time: 05/29/24  9:37 AM   Specimen: BLOOD  RIGHT FOREARM  Result Value Ref Range Status   Specimen Description BLOOD RIGHT FOREARM  Final   Special Requests   Final    BOTTLES DRAWN AEROBIC AND ANAEROBIC Blood Culture adequate volume   Culture   Final    NO GROWTH 3 DAYS Performed at [facility], [address], [place], [place] [address]    Report Status PENDING  Incomplete   *Note: Due to a large number of results and/or encounters for the requested time period, some results have not been displayed. A complete set of results can be found in Results Review.    Labs: CBC: Recent Labs  Lab 05/27/24 1403 05/29/24 0937 05/29/24 2115 05/30/24 0323 05/30/24 1640 05/31/24 0536 05/31/24 1654 06/01/24 0442  WBC 10.2 8.7  --  4.6  --   --   --   --   NEUTROABS  --  7.4  --   --   --   --   --   --   HGB 15.9 15.0 13.9 14.0 15.2 14.6 15.1 15.5  HCT 51.0 48.3 43.9 44.0  --   --   --   --   MCV 89.6 90.3  --  88.9  --   --   --   --   PLT 120* 106*  --  102*  --   --   --   --    Basic Metabolic Panel: Recent Labs  Lab 05/27/24 1403 05/29/24 0937 05/30/24 0323  NA 137 139 140  K 4.3 4.1 4.4  CL 101 100 104  CO2 27 27 26   GLUCOSE 164* 137* 155*  BUN 22 15 20   CREATININE 1.18 0.99 1.01  CALCIUM  9.1 9.5 9.3  MG  --  1.9  --    Liver Function Tests: Recent Labs  Lab 05/27/24 1403 05/29/24 0937  AST 24 19  ALT 21 20  ALKPHOS 53 50  BILITOT 1.5* 1.8*  PROT 6.5 6.5  ALBUMIN 3.6 3.7   CBG: Recent Labs  Lab 05/31/24 0808 05/31/24 1134 05/31/24 1635 05/31/24 2044 06/01/24 0824  GLUCAP 176* 196* 152* 156* 170*    Discharge time spent: greater than 30 minutes.  Signed: AIDA CHO, MD [facility] 06/01/2024

## 2024-06-02 ENCOUNTER — Telehealth: Payer: Self-pay

## 2024-06-02 NOTE — Transitions of Care (Post Inpatient/ED Visit) (Signed)
 06/02/2024  Name: Lawrence [name] MRN: [id number] DOB: 10/29/1956  Today's TOC FU Call Status: Today's TOC FU Call Status:: Successful TOC FU Call Completed TOC FU Call Complete Date: 06/02/24 Patient's Name and Date of Birth confirmed.  Transition Care Management Follow-up Telephone Call Date of Discharge: 06/01/24 Discharge Facility: [facility]) Type of Discharge: Inpatient Admission Primary Inpatient Discharge Diagnosis:: Pneumonia, HX COPD How have you been since you were released from the hospital?: Better Any questions or concerns?: No  Items Reviewed: Did you receive and understand the discharge instructions provided?: Yes Medications obtained,verified, and reconciled?: Yes (Medications Reviewed) Any new allergies since your discharge?: No Dietary orders reviewed?: Yes Type of Diet Ordered:: Heart Healthy, Low sodium Do you have support at home?: Yes People in Home [RPT]: grandchild(ren) Name of Support/Comfort Primary Source: [name]  Medications Reviewed Today: Medications Reviewed Today     Reviewed by Eilleen Richerd GRADE, RN (Registered Nurse) on 06/02/24 at 1330  Med List Status: <None>   Medication Order Taking? Sig Documenting Provider Last Dose Status Informant  Accu-Chek Softclix Lancets lancets [id number] Yes Use as instructed to check sugar daily for type 2 diabetes. Lawrence Nancyann BRAVO, MD  Active Self  albuterol  (PROVENTIL ) (2.5 MG/3ML) 0.083% nebulizer solution [id number] Yes Take 3 mLs (2.5 mg total) by nebulization every 6 (six) hours as needed for wheezing or shortness of breath. Lawrence Nancyann BRAVO, MD  Active Self  allopurinol  (ZYLOPRIM ) 300 MG tablet [id number] Yes TAKE 1 TABLET BY MOUTH TWICE A DAY Lawrence Nancyann BRAVO, MD  Active Self  ALPRAZolam  (XANAX ) 1 MG tablet [id number] Yes TAKE 1 TABLET BY MOUTH 3 TIMES A DAY AS NEEDED Lawrence Nancyann BRAVO, MD  Active Self  aluminum -magnesium  hydroxide-simethicone  (MAALOX) 200-200-20 MG/5ML SUSP  [id number] Yes Take 30 mLs by mouth 3 (three) times daily as needed. [name], MD  Active   amLODipine  (NORVASC ) 10 MG tablet [id number] Yes Take 10 mg by mouth daily. [provider]  Active Self  amoxicillin -clavulanate (AUGMENTIN ) 875-125 MG tablet [id number] Yes Take 1 tablet by mouth 2 (two) times daily for 4 days. [name], MD  Active   apixaban  (ELIQUIS ) 5 MG TABS tablet [id number] Yes Take 1 tablet (5 mg total) by mouth 2 (two) times daily. Lawrence Nancyann BRAVO, MD  Active Self  atorvastatin  (LIPITOR ) 80 MG tablet [id number] Yes TAKE 1 TABLET BY MOUTH AT BEDTIME Lawrence Nancyann BRAVO, MD  Active Self  Blood Glucose Calibration (ACCU-CHEK GUIDE CONTROL) [name] [id number] Yes Use with blood glucose monitor as directed Lawrence Nancyann BRAVO, MD  Active Self  Blood Glucose Monitoring Suppl (ACCU-CHEK GUIDE) w/Device KIT [id number] Yes Use to check blood sugars as directed Lawrence Nancyann BRAVO, MD  Active Self  Budeson-Glycopyrrol-Formoterol  (BREZTRI  AEROSPHERE) 160-9-4.8 MCG/ACT [name] [id number] Yes Inhale 2 puffs into the lungs 2 (two) times daily. Lawrence Nancyann BRAVO, MD  Active Self  carvedilol  (COREG ) 3.125 MG tablet [id number] Yes TAKE 1 TABLET BY MOUTH TWICE A DAY Lawrence Nancyann BRAVO, MD  Active Self  cetirizine  (ZYRTEC ) 10 MG tablet [id number] Yes Take 1 tablet (10 mg total) by mouth at bedtime. Lawrence Nancyann BRAVO, MD  Active Self  Cyanocobalamin  (VITAMIN B-12) 1000 MCG SUBL [id number] Yes Place 1 tablet under the tongue daily. [provider]  Active Self  dapagliflozin  propanediol (FARXIGA ) 10 MG TABS tablet [id number] Yes TAKE 1 TABLET BY MOUTH DAILY Lawrence Nancyann BRAVO, MD  Active Self  doxycycline  (VIBRA -TABS) 100 MG tablet [id number] Yes Take 1 tablet (100 mg total)  by mouth 2 (two) times daily for 4 days. [name], MD  Active   famotidine  (PEPCID ) 20 MG tablet [id number] Yes Take 1 tablet (20 mg total) by mouth 2 (two) times daily. Lawrence Nancyann BRAVO, MD  Active Self  furosemide  (LASIX ) 20 MG  tablet [id number] Yes Take 1 tablet (20 mg total) by mouth daily as needed. Lawrence Nancyann BRAVO, MD  Active Self  gabapentin  (NEURONTIN ) 300 MG capsule [id number] Yes Take 1 capsule (300 mg total) by mouth 2 (two) times daily. Lawrence Nancyann BRAVO, MD  Active Self  glucose blood (ACCU-CHEK GUIDE) test strip [id number] Yes Use as instructed to check sugar daily for type 2 diabetes. Lawrence Nancyann BRAVO, MD  Active Self  hydrOXYzine  (VISTARIL ) 25 MG capsule [id number]  Take 1 capsule (25 mg total) by mouth 3 (three) times daily as needed.  Patient not taking: Reported on 06/02/2024   Lawrence Ronal BRAVO, MD  Active Self  iron  polysaccharides (FERREX 150) 150 MG capsule [id number] Yes TAKE 1 CAPSULE BY MOUTH DAILY Lawrence Nancyann BRAVO, MD  Active Self  isosorbide  mononitrate (IMDUR ) 120 MG 24 hr tablet [id number] Yes Take 1 tablet (120 mg total) by mouth daily. Von Bellis, MD  Active Self  lithium  carbonate 150 MG capsule [id number] Yes Take 150 mg by mouth 2 (two) times daily. [provider]  Active Self  metFORMIN  (GLUCOPHAGE -XR) 500 MG 24 hr tablet [id number] Yes TAKE 1 TABLET BY MOUTH DAILY WITH EVENING MEAL Lawrence Nancyann BRAVO, MD  Active Self  methocarbamol  (ROBAXIN ) 500 MG tablet [id number] Yes TAKE 1 TABLET BY MOUTH EVERY 8 HOURS AS NEEDED FOR MUSCLE SPASMS Lawrence Nancyann BRAVO, MD  Active Self  metoCLOPramide  (REGLAN ) 10 MG tablet [id number]  Take 1 tablet (10 mg total) by mouth 4 (four) times daily -  before meals and at bedtime for 15 days. Lawrence Pastor, MD  Expired 03/27/24 2359   metoprolol  succinate (TOPROL -XL) 25 MG 24 hr tablet [id number] Yes Take 12.5 mg by mouth daily. [provider]  Active Self  metroNIDAZOLE  (METROGEL ) 1 % gel [id number] Yes Apply topically daily. Lawrence Nancyann BRAVO, MD  Active Self  nitroGLYCERIN  (NITROSTAT ) 0.4 MG SL tablet [id number] Yes Place 1 tablet (0.4 mg total) under the tongue every 5 (five) minutes as needed for chest pain (Do not take if blood pressure is low, systolic  BP<110). [name], MD  Active Self           Med Note HALLIE, [facility]   Tue Mar 21, 2024 12:58 PM) Needs new order  omega-3 acid ethyl esters (LOVAZA ) 1 g capsule [id number] Yes TAKE 4 CAPSULES (4 GRAMS TOTAL) BY WARDEN Lawrence Nancyann BRAVO, MD  Active Self  oxyCODONE -acetaminophen  (PERCOCET) 10-325 MG tablet [id number] Yes TAKE 1 TABLET BY MOUTH EVERY 4 HOURS AS NEEDED FOR PAIN Lawrence Nancyann BRAVO, MD  Active Self  OXYGEN  [id number] Yes Inhale 2 L into the lungs at bedtime as needed (for shortness of breath). [provider]  Active Self  pantoprazole  (PROTONIX ) 40 MG tablet [id number] Yes TAKE 1 TABLET BY MOUTH 2 TIMES DAILY (30MINUTES BEFORE A MEAL) Lawrence Nancyann BRAVO, MD  Active Self  predniSONE  (DELTASONE ) 20 MG tablet [id number] Yes Take 2 tablets (40 mg total) by mouth daily for 2 days. [name], MD  Active   ranolazine  (RANEXA ) 1000 MG SR tablet [id number] Yes Take 1 tablet (1,000 mg total) by mouth 2 (two) times daily. Darci Pore, MD  Active Self  sacubitril -valsartan  (ENTRESTO ) 24-26 MG [id number] Yes  Take 1 tablet by mouth 2 (two) times daily. [provider]  Active Self  sennosides-docusate sodium  (SENOKOT-S) 8.6-50 MG tablet [id number] Yes Take 1 tablet by mouth daily as needed for constipation. [name], MD  Active   spironolactone  (ALDACTONE ) 25 MG tablet [id number] Yes Take 1 tablet (25 mg total) by mouth daily. Lawrence Nancyann BRAVO, MD  Active Self  traZODone  (DESYREL ) 150 MG tablet [id number] Yes Take 1 tablet (150 mg total) by mouth at bedtime as needed for sleep. Lawrence Nancyann BRAVO, MD  Active Self  TRINTELLIX  10 MG TABS tablet [id number] Yes Take 10 mg by mouth daily. [provider]  Active Self            Home Care and Equipment/Supplies: Were Home Health Services Ordered?: No  Functional Questionnaire: Do you need assistance with bathing/showering or dressing?: No Do you need assistance with meal preparation?: No Do you need  assistance with eating?: No Do you have difficulty maintaining continence: No Do you need assistance with getting out of bed/getting out of a chair/moving?: No Do you have difficulty managing or taking your medications?: No  Follow up appointments reviewed: PCP Follow-up appointment confirmed?: Yes Date of PCP follow-up appointment?: 06/09/24 Follow-up Provider: Dr. Nancyann Lawrence Specialist [facility] Follow-up appointment confirmed?: Yes Date of Specialist follow-up appointment?: 06/13/24 Follow-Up Specialty Provider:: [facility] doctors Do you need transportation to your follow-up appointment?: No Do you understand care options if your condition(s) worsen?: Yes-patient verbalized understanding  SDOH Interventions Today    Flowsheet Row Most Recent Value  SDOH Interventions   Food Insecurity Interventions Intervention Not Indicated  Housing Interventions Intervention Not Indicated  Transportation Interventions Intervention Not Indicated  [Usees Medicaid transportation 3 days notice for local and 5 days notice for out of town appointments]  Utilities Interventions Intervention Not Indicated    Goals Addressed             This Visit's Progress    VBCI Transitions of Care (TOC) Care Plan       Problems:  Recent Hospitalization for treatment of COPD, Pulmonary Disease, and Pneumonia   Goal:  Over the next 30 days, the patient will not experience hospital readmission  Interventions:   COPD Interventions: Advised patient to self assesses COPD action plan zone and make appointment with provider if in the yellow zone for 48 hours without improvement Discussed the importance of adequate rest and management of fatigue with COPD Provided education about and advised patient to utilize infection prevention strategies to reduce risk of respiratory infection Use of home oxygen   Patient Self Care Activities:  Attend all scheduled provider appointments Call pharmacy for medication refills 3-7  days in advance of running out of medications Call provider office for new concerns or questions  Notify RN Care Manager of TOC call rescheduling needs Participate in Transition of Care Program/Attend TOC scheduled calls  Plan:  Follow up and participate in 30 day weekly calls for the next 4 weeks. Contact your provider for any worsening symptoms with chills, fever, difficulty breathing        Richerd Fish, RN, Scientist, research (physical sciences), CCM [employer], [facility] RN Care Manager Direct Dial: [phone]

## 2024-06-02 NOTE — Patient Instructions (Signed)
 Visit Information  Thank you for taking time to visit with me today. Please don't hesitate to contact me if I can be of assistance to you before our next scheduled telephone appointment.  Our next appointment is by telephone on 06/09/24 at 3:00PM  Following is a copy of your care plan:   Goals Addressed             This Visit's Progress    VBCI Transitions of Care (TOC) Care Plan       Problems:  Recent Hospitalization for treatment of COPD, Pulmonary Disease, and Pneumonia   Goal:  Over the next 30 days, the patient will not experience hospital readmission  Interventions:   COPD Interventions: Advised patient to self assesses COPD action plan zone and make appointment with provider if in the yellow zone for 48 hours without improvement Discussed the importance of adequate rest and management of fatigue with COPD Provided education about and advised patient to utilize infection prevention strategies to reduce risk of respiratory infection Use of home oxygen   Patient Self Care Activities:  Attend all scheduled provider appointments Call pharmacy for medication refills 3-7 days in advance of running out of medications Call provider office for new concerns or questions  Notify RN Care Manager of TOC call rescheduling needs Participate in Transition of Care Program/Attend TOC scheduled calls  Plan:  Follow up and participate in 30 day weekly calls for the next 4 weeks. Contact your provider for any worsening symptoms with chills, fever, difficulty breathing        The patient verbalized understanding of instructions, educational materials, and care plan provided today and DECLINED offer to receive copy of patient instructions, educational materials, and care plan.   Telephone follow up appointment with care management team member scheduled for: The care management team will reach out to the patient again over the next 7- 10 busiiness days.   Please call the care guide  team at [phone] if you need to cancel or reschedule your appointment.   Please call the [place]  National Suicide Prevention Lifeline: (708)228-6617 or TTY: 401-215-3095 TTY (313)657-6583) to talk to a trained counselor if you are experiencing a Mental Health or Behavioral Health Crisis or need someone to talk to.  Richerd Fish, RN, BSN, CCM [facility], [facility] RN Care Manager Direct Dial: [phone]

## 2024-06-02 NOTE — Transitions of Care (Post Inpatient/ED Visit) (Signed)
   06/02/2024  Name: Lawrence [name] MRN: [id number] DOB: Dec 14, 1955  Today's TOC FU Call Status: Today's TOC FU Call Status:: Unsuccessful Call (1st Attempt) Unsuccessful Call (1st Attempt) Date: 06/02/24  Attempted to reach the patient regarding the most recent Inpatient/ED visit.  Follow Up Plan: Additional outreach attempts will be made to reach the patient to complete the Transitions of Care (Post Inpatient/ED visit) call.   Richerd Fish, RN, BSN, CCM [facility], [facility] RN Care Manager Direct Dial: [phone]

## 2024-06-03 LAB — CULTURE, BLOOD (SINGLE)
Culture: NO GROWTH
Special Requests: ADEQUATE

## 2024-06-03 NOTE — Patient Instructions (Signed)
 Thank you for allowing the Complex Care Management team to participate in your care. It was great speaking with you!  We will follow up on June 22, 2024 at 2pm. Please do not hesitate to contact me if you require assistance prior to our next outreach.    Jackson Acron [facility] Population Health RN Care Manager Direct Dial: [phone]  Fax: [phone] Website: [email or web address]

## 2024-06-03 NOTE — Patient Outreach (Signed)
 Complex Care Management   Visit Note   Name:  Lawrence [name] MRN: [id number] DOB: 1956/02/14  Situation: Referral received for Complex Care Management related to Heart Failure, COPD, Atrial Fibrillation, and Diabetes. I obtained verbal consent from Patient.  Visit completed with Lawrence [name] via telephone.  Background:   Past Medical History:  Diagnosis Date   A-fib (HCC)    Anemia    Anginal pain (HCC)    Anxiety    Arthritis    Asthma    CAD (coronary artery disease)    a. 2002 CABGx2 (LIMA->LAD, VG->VG->OM1);  b. 09/2012 DES->OM;  c. 03/2015 PTCA of LAD [facility]) in setting of atretic LIMA; d. 05/2015 Cath [facility]): nonobs dzs; e. 06/2015 Cath ([facility]): LM nl, LAD 45p/d ISR, 50d, D1/2 small, LCX 50p/d ISR, OM1 70ost, 30 ISR, VG->OM1 50ost, 84m, LIMA->LAD 99p/d - atretic, RCA dom, nl; f.cath 10/16: 40-50%(FFR 0.90) pLAD, 75% (FFR 0.77) mLAD s/p PCI/DES, oRCA 40% (FFR0.95)   Cancer (HCC)    SKIN CANCER ON BACK   Celiac disease    Chronic diastolic CHF (congestive heart failure) (HCC)    a. 06/2009 Echo: EF 60-65%, Gr 1 DD, triv AI, mildly dil LA, nl RV.   COPD (chronic obstructive pulmonary disease) (HCC)    a. Chronic bronchitis and emphysema.   DDD (degenerative disc disease), lumbar    Diverticulosis    Dysrhythmia    Essential hypertension    GERD (gastroesophageal reflux disease)    History of hiatal hernia    History of kidney stones    H/O   History of tobacco abuse    a. Quit 2014.   Myocardial infarction [facility]) 2002   4 STENTS   Pancreatitis    PSVT (paroxysmal supraventricular tachycardia) (HCC)    a. 10/2012 Noted on Zio Patch.   Sleep apnea    LOST CORD TO CPAP -ONLY 02 @ BEDTIME   Tubular adenoma of colon    Type II diabetes mellitus (HCC)     Assessment: Patient Reported Symptoms:  Cognitive Cognitive Status: Alert and oriented to person, place, and time Cognitive/Intellectual Conditions Management [RPT]: Other Other: Experiences episodes of memory decline.  Pending evaluation with Neurologist on June 07, 2024 Health Maintenance Behaviors: Exercise, Healthy diet Healing Pattern: Unsure Health Facilitated by: Stress management, Rest, Pain control  Neurological Neurological Review of Symptoms: Other: [place] Neurological Symptoms/Conditions [RPT]: Pending initial evaluation with Neurologist on June 07, 2024 Neurological Management Strategies: Routine screening Neurological Comment: Patient has experienced increased episodes of memory loss. Pending evaluation with the Neurology team/Dr. Larae on June 07, 2024  HEENT HEENT Symptoms Reported: No symptoms reported HEENT Management Strategies: Routine screening HEENT Self-Management Outcome: 4 (good)    Cardiovascular Cardiovascular Symptoms Reported: No symptoms reported Cardiovascular Management Strategies: Medication therapy, Routine screening  Respiratory Respiratory Symptoms Reported: No symptoms reported, Other: Other Respiratory Symptoms: Hospitalized on July 12th and discharged today. He was treated for COPD exacerbation due to right lower lobe pneumonia. Reports doing well at time of call. Reviewed plan to start prednisone . Confirmed having ICS. Reviewed instructions for use. Reviewed worsening symptoms that require immediate medical attention.  Endocrine Endocrine Symptoms Reported: No symptoms reported Is patient diabetic?: Yes Is patient checking blood sugars at home?: Yes List most recent blood sugar readings, include date and time of day: Has not checked for the past few days due to hospitalization.  Gastrointestinal Gastrointestinal Symptoms Reported: No symptoms reported  Genitourinary Genitourinary Symptoms Reported: Incontinence Genitourinary Management Strategies: Coping strategies, Incontinence garment/pad  Genitourinary Self-Management Outcome: 4 (good)  Integumentary Integumentary Symptoms Reported: Other Other Integumentary Symptoms: Reports some skin discoloration and bruising that  occured while hospitalized. Denies open wounds or rashes. Skin Management Strategies: Routine screening  Musculoskeletal Musculoskelatal Symptoms Reviewed: Unsteady gait Additional Musculoskeletal Details: Unsteady gait. Requires walker with ambulation Musculoskeletal Management Strategies: Medical device, Routine screening, Coping strategies Musculoskeletal Self-Management Outcome: 3 (uncertain) Falls in the past year?: Yes Number of falls in past year: 2 or more Was there an injury with Fall?: No Fall Risk Category Calculator: 2 Patient Fall Risk Level: Moderate Fall Risk Patient at Risk for Falls Due to: History of fall(s), Impaired balance/gait, Medication side effect (Decreased activity tolerance) Fall risk Follow up: Falls prevention discussed  Psychosocial Psychosocial Symptoms Reported: No symptoms reported Quality of Family Relationships: supportive Do you feel physically threatened by others?: No      04/24/2024    3:52 PM  Depression screen PHQ 2/9  Decreased Interest 0  Down, Depressed, Hopeless 0  PHQ - 2 Score 0    There were no vitals filed for this visit.  Medications Reviewed Today     Reviewed by Karoline Lima, RN (Registered Nurse) on 06/03/24 at 1356  Med List Status: <None>   Medication Order Taking? Sig Documenting Provider Last Dose Status Informant  Accu-Chek Softclix Lancets lancets [id number]  Use as instructed to check sugar daily for type 2 diabetes. [name], MD  Active Self  albuterol  (PROVENTIL ) (2.5 MG/3ML) 0.083% nebulizer solution [id number]  Take 3 mLs (2.5 mg total) by nebulization every 6 (six) hours as needed for wheezing or shortness of breath. [name], MD  Active Self  allopurinol  (ZYLOPRIM ) 300 MG tablet [id number]  TAKE 1 TABLET BY MOUTH TWICE A DAY [name], MD  Active Self  ALPRAZolam  (XANAX ) 1 MG tablet [id number]  TAKE 1 TABLET BY MOUTH 3 TIMES A DAY AS NEEDED [name], MD  Active Self  aluminum -magnesium   hydroxide-simethicone  (MAALOX) 200-200-20 MG/5ML SUSP [id number]  Take 30 mLs by mouth 3 (three) times daily as needed. [name], MD  Active   amLODipine  (NORVASC ) 10 MG tablet [id number]  Take 10 mg by mouth daily. [provider]  Active Self  amoxicillin -clavulanate (AUGMENTIN ) 875-125 MG tablet [id number]  Take 1 tablet by mouth 2 (two) times daily for 4 days. [name], MD  Active   apixaban  (ELIQUIS ) 5 MG TABS tablet [id number]  Take 1 tablet (5 mg total) by mouth 2 (two) times daily. [name], MD  Active Self  atorvastatin  (LIPITOR ) 80 MG tablet [id number]  TAKE 1 TABLET BY MOUTH AT BEDTIME [name], MD  Active Self  Blood Glucose Calibration (ACCU-CHEK GUIDE CONTROL) [name] [id number]  Use with blood glucose monitor as directed [name], MD  Active Self  Blood Glucose Monitoring Suppl (ACCU-CHEK GUIDE) w/Device KIT [id number]  Use to check blood sugars as directed [name], MD  Active Self  Budeson-Glycopyrrol-Formoterol  (BREZTRI  AEROSPHERE) 160-9-4.8 MCG/ACT [name] [id number]  Inhale 2 puffs into the lungs 2 (two) times daily. [name], MD  Active Self  carvedilol  (COREG ) 3.125 MG tablet [id number]  TAKE 1 TABLET BY MOUTH TWICE A DAY [name], MD  Active Self  cetirizine  (ZYRTEC ) 10 MG tablet [id number]  Take 1 tablet (10 mg total) by mouth at bedtime. [name], MD  Active Self  Cyanocobalamin  (VITAMIN B-12) 1000 MCG SUBL [id number]  Place 1 tablet under the tongue daily. [provider]  Active Self  dapagliflozin  propanediol (FARXIGA ) 10 MG TABS tablet [id number]  TAKE 1 TABLET BY MOUTH DAILY [name], Nancyann BRAVO, MD  Active Self  doxycycline  (VIBRA -TABS) 100 MG tablet [id number]  Take 1 tablet (100 mg total) by mouth 2 (two) times daily for 4 days. [name], MD  Active   famotidine  (PEPCID ) 20 MG tablet [id number]  Take 1 tablet (20 mg total) by mouth 2 (two) times daily. [name], MD  Active Self  furosemide   (LASIX ) 20 MG tablet [id number]  Take 1 tablet (20 mg total) by mouth daily as needed. [name], MD  Active Self  gabapentin  (NEURONTIN ) 300 MG capsule [id number]  Take 1 capsule (300 mg total) by mouth 2 (two) times daily. [name], MD  Active Self  glucose blood (ACCU-CHEK GUIDE) test strip [id number]  Use as instructed to check sugar daily for type 2 diabetes. [name], MD  Active Self  hydrOXYzine  (VISTARIL ) 25 MG capsule [id number]  Take 1 capsule (25 mg total) by mouth 3 (three) times daily as needed.  Patient not taking: Reported on 06/02/2024   Ernest Ronal BRAVO, MD  Active Self  iron  polysaccharides (FERREX 150) 150 MG capsule [id number]  TAKE 1 CAPSULE BY MOUTH DAILY [name], MD  Active Self  isosorbide  mononitrate (IMDUR ) 120 MG 24 hr tablet [id number]  Take 1 tablet (120 mg total) by mouth daily. Von Bellis, MD  Active Self  lithium  carbonate 150 MG capsule [id number]  Take 150 mg by mouth 2 (two) times daily. [provider]  Active Self  metFORMIN  (GLUCOPHAGE -XR) 500 MG 24 hr tablet [id number]  TAKE 1 TABLET BY MOUTH DAILY WITH EVENING MEAL [name], MD  Active Self  methocarbamol  (ROBAXIN ) 500 MG tablet [id number]  TAKE 1 TABLET BY MOUTH EVERY 8 HOURS AS NEEDED FOR MUSCLE SPASMS [name], MD  Active Self  metoprolol  succinate (TOPROL -XL) 25 MG 24 hr tablet [id number]  Take 12.5 mg by mouth daily. [provider]  Active Self  metroNIDAZOLE  (METROGEL ) 1 % gel [id number]  Apply topically daily. [name], MD  Active Self  nitroGLYCERIN  (NITROSTAT ) 0.4 MG SL tablet [id number]  Place 1 tablet (0.4 mg total) under the tongue every 5 (five) minutes as needed for chest pain (Do not take if blood pressure is low, systolic BP<110). [name], MD  Active Self           Med Note HALLIE, [facility]   Tue Mar 21, 2024 12:58 PM) Needs new order  omega-3 acid ethyl esters (LOVAZA ) 1 g capsule [id number]  TAKE 4 CAPSULES (4 GRAMS TOTAL) BY  WARDEN [name], MD  Active Self  oxyCODONE -acetaminophen  (PERCOCET) 10-325 MG tablet [id number]  TAKE 1 TABLET BY MOUTH EVERY 4 HOURS AS NEEDED FOR PAIN [name], MD  Active Self  OXYGEN  [id number]  Inhale 2 L into the lungs at bedtime as needed (for shortness of breath). [provider]  Active Self  pantoprazole  (PROTONIX ) 40 MG tablet [id number]  TAKE 1 TABLET BY MOUTH 2 TIMES DAILY (30MINUTES BEFORE A MEAL) [name], MD  Active Self  predniSONE  (DELTASONE ) 20 MG tablet [id number]  Take 2 tablets (40 mg total) by mouth daily for 2 days. [name], MD  Active   ranolazine  (RANEXA ) 1000 MG SR tablet [id number]  Take 1 tablet (1,000 mg total) by mouth 2 (two) times daily. Darci Pore, MD  Active Self  sacubitril -valsartan  (ENTRESTO ) 24-26 MG [id number]  Take 1 tablet by mouth 2 (two) times daily. [provider]  Active Self  sennosides-docusate sodium  (SENOKOT-S) 8.6-50 MG tablet [id number]  Take 1 tablet by mouth daily as needed for constipation. [name], MD  Active   spironolactone  (ALDACTONE ) 25 MG tablet [id number]  Take 1 tablet (25 mg total) by mouth daily. [name], MD  Active Self  traZODone  (DESYREL ) 150 MG tablet [id number]  Take 1 tablet (150 mg total) by mouth at bedtime as needed for sleep. [name], MD  Active Self  TRINTELLIX  10 MG TABS tablet [id number]  Take 10 mg by mouth daily. [provider]  Active Self            Recommendation:   PCP Follow-up on June 09, 2024 Specialty provider follow-up: Neurology on June 07, 2024  Follow Up Plan:   Telephone follow up appointment with Nurse Case Manager on June 22, 2024   [facility] RN Care Manager Direct Dial: [phone]  Fax: [phone] Website: [email or web address]

## 2024-06-05 ENCOUNTER — Other Ambulatory Visit: Payer: Self-pay | Admitting: Family Medicine

## 2024-06-09 ENCOUNTER — Encounter: Payer: Self-pay | Admitting: Family Medicine

## 2024-06-09 ENCOUNTER — Emergency Department

## 2024-06-09 ENCOUNTER — Encounter: Payer: Self-pay | Admitting: Emergency Medicine

## 2024-06-09 ENCOUNTER — Other Ambulatory Visit: Payer: Self-pay

## 2024-06-09 ENCOUNTER — Observation Stay
Admission: EM | Admit: 2024-06-09 | Discharge: 2024-06-13 | Disposition: A | Discharge status: Home Health | Attending: Osteopathic Medicine | Admitting: Osteopathic Medicine

## 2024-06-09 ENCOUNTER — Other Ambulatory Visit: Payer: Self-pay | Admitting: Neurology

## 2024-06-09 ENCOUNTER — Ambulatory Visit: Admitting: Family Medicine

## 2024-06-09 VITALS — BP 102/83 | HR 95 | Resp 14 | Wt 175.2 lb

## 2024-06-09 DIAGNOSIS — I10 Essential (primary) hypertension: Secondary | ICD-10-CM | POA: Diagnosis present

## 2024-06-09 DIAGNOSIS — I2510 Atherosclerotic heart disease of native coronary artery without angina pectoris: Secondary | ICD-10-CM | POA: Diagnosis present

## 2024-06-09 DIAGNOSIS — I482 Chronic atrial fibrillation, unspecified: Secondary | ICD-10-CM

## 2024-06-09 DIAGNOSIS — J189 Pneumonia, unspecified organism: Secondary | ICD-10-CM

## 2024-06-09 DIAGNOSIS — R55 Syncope and collapse: Principal | ICD-10-CM | POA: Insufficient documentation

## 2024-06-09 DIAGNOSIS — F32A Depression, unspecified: Secondary | ICD-10-CM

## 2024-06-09 DIAGNOSIS — R079 Chest pain, unspecified: Secondary | ICD-10-CM | POA: Diagnosis not present

## 2024-06-09 DIAGNOSIS — E663 Overweight: Secondary | ICD-10-CM | POA: Diagnosis present

## 2024-06-09 DIAGNOSIS — I4891 Unspecified atrial fibrillation: Secondary | ICD-10-CM

## 2024-06-09 DIAGNOSIS — J449 Chronic obstructive pulmonary disease, unspecified: Secondary | ICD-10-CM | POA: Diagnosis not present

## 2024-06-09 DIAGNOSIS — I5042 Chronic combined systolic (congestive) and diastolic (congestive) heart failure: Secondary | ICD-10-CM | POA: Diagnosis present

## 2024-06-09 DIAGNOSIS — I110 Hypertensive heart disease with heart failure: Secondary | ICD-10-CM | POA: Diagnosis not present

## 2024-06-09 DIAGNOSIS — J42 Unspecified chronic bronchitis: Secondary | ICD-10-CM

## 2024-06-09 DIAGNOSIS — E785 Hyperlipidemia, unspecified: Secondary | ICD-10-CM | POA: Insufficient documentation

## 2024-06-09 DIAGNOSIS — F419 Anxiety disorder, unspecified: Secondary | ICD-10-CM

## 2024-06-09 DIAGNOSIS — F319 Bipolar disorder, unspecified: Secondary | ICD-10-CM | POA: Insufficient documentation

## 2024-06-09 DIAGNOSIS — K219 Gastro-esophageal reflux disease without esophagitis: Secondary | ICD-10-CM | POA: Diagnosis not present

## 2024-06-09 DIAGNOSIS — Z09 Encounter for follow-up examination after completed treatment for conditions other than malignant neoplasm: Secondary | ICD-10-CM

## 2024-06-09 DIAGNOSIS — G3184 Mild cognitive impairment, so stated: Secondary | ICD-10-CM

## 2024-06-09 DIAGNOSIS — E782 Mixed hyperlipidemia: Secondary | ICD-10-CM | POA: Diagnosis present

## 2024-06-09 DIAGNOSIS — I5043 Acute on chronic combined systolic (congestive) and diastolic (congestive) heart failure: Secondary | ICD-10-CM | POA: Insufficient documentation

## 2024-06-09 DIAGNOSIS — I5022 Chronic systolic (congestive) heart failure: Secondary | ICD-10-CM | POA: Diagnosis present

## 2024-06-09 DIAGNOSIS — F418 Other specified anxiety disorders: Secondary | ICD-10-CM | POA: Diagnosis present

## 2024-06-09 LAB — CBC WITH DIFFERENTIAL/PLATELET
Abs Immature Granulocytes: 0.14 K/uL — ABNORMAL HIGH (ref 0.00–0.07)
Basophils Absolute: 0 K/uL (ref 0.0–0.1)
Basophils Relative: 0 %
Eosinophils Absolute: 0 K/uL (ref 0.0–0.5)
Eosinophils Relative: 0 %
HCT: 52.4 % — ABNORMAL HIGH (ref 39.0–52.0)
Hemoglobin: 17.1 g/dL — ABNORMAL HIGH (ref 13.0–17.0)
Immature Granulocytes: 2 %
Lymphocytes Relative: 25 %
Lymphs Abs: 2.1 K/uL (ref 0.7–4.0)
MCH: 28.5 pg (ref 26.0–34.0)
MCHC: 32.6 g/dL (ref 30.0–36.0)
MCV: 87.3 fL (ref 80.0–100.0)
Monocytes Absolute: 0.6 K/uL (ref 0.1–1.0)
Monocytes Relative: 7 %
Neutro Abs: 5.7 K/uL (ref 1.7–7.7)
Neutrophils Relative %: 66 %
Platelets: 180 K/uL (ref 150–400)
RBC: 6 MIL/uL — ABNORMAL HIGH (ref 4.22–5.81)
RDW: 18.6 % — ABNORMAL HIGH (ref 11.5–15.5)
WBC: 8.6 K/uL (ref 4.0–10.5)
nRBC: 0 % (ref 0.0–0.2)

## 2024-06-09 LAB — COMPREHENSIVE METABOLIC PANEL WITH GFR
ALT: 18 U/L (ref 0–44)
AST: 14 U/L — ABNORMAL LOW (ref 15–41)
Albumin: 3.4 g/dL — ABNORMAL LOW (ref 3.5–5.0)
Alkaline Phosphatase: 48 U/L (ref 38–126)
Anion gap: 10 (ref 5–15)
BUN: 28 mg/dL — ABNORMAL HIGH (ref 8–23)
CO2: 28 mmol/L (ref 22–32)
Calcium: 9.7 mg/dL (ref 8.9–10.3)
Chloride: 101 mmol/L (ref 98–111)
Creatinine, Ser: 1.12 mg/dL (ref 0.61–1.24)
GFR, Estimated: >60 mL/min (ref >60)
Glucose, Bld: 113 mg/dL — ABNORMAL HIGH (ref 70–99)
Potassium: 4.6 mmol/L (ref 3.5–5.1)
Sodium: 139 mmol/L (ref 135–145)
Total Bilirubin: 1.4 mg/dL — ABNORMAL HIGH (ref 0.0–1.2)
Total Protein: 6.1 g/dL — ABNORMAL LOW (ref 6.5–8.1)

## 2024-06-09 LAB — TROPONIN I (HIGH SENSITIVITY): Troponin I (High Sensitivity): 12 ng/L (ref <18)

## 2024-06-09 MED ORDER — ONDANSETRON HCL 4 MG/2ML IJ SOLN
4.0000 mg | Freq: Three times a day (TID) | INTRAMUSCULAR | Status: DC | PRN
  Administered 2024-06-10 – 2024-06-13 (×4): 4 mg via INTRAVENOUS
  Filled 2024-06-09 (×5): qty 2

## 2024-06-09 MED ORDER — HYDRALAZINE HCL 20 MG/ML IJ SOLN: 5.0000 mg | INTRAMUSCULAR | Status: DC | PRN

## 2024-06-09 MED ORDER — INSULIN ASPART 100 UNIT/ML IJ SOLN: 0.0000 [IU] | Freq: Every day | INTRAMUSCULAR | Status: DC

## 2024-06-09 MED ORDER — ALBUTEROL SULFATE HFA 108 (90 BASE) MCG/ACT IN AERS: 2.0000 | INHALATION_SPRAY | RESPIRATORY_TRACT | Status: DC | PRN

## 2024-06-09 MED ORDER — OXYCODONE-ACETAMINOPHEN 5-325 MG PO TABS
1.0000 | ORAL_TABLET | ORAL | Status: DC | PRN
  Administered 2024-06-10 (×2): 1 via ORAL
  Filled 2024-06-09 (×2): qty 1

## 2024-06-09 MED ORDER — INSULIN ASPART 100 UNIT/ML IJ SOLN
0.0000 [IU] | Freq: Three times a day (TID) | INTRAMUSCULAR | Status: DC
  Administered 2024-06-10: 2 [IU] via SUBCUTANEOUS
  Administered 2024-06-10: 1 [IU] via SUBCUTANEOUS
  Administered 2024-06-11: 2 [IU] via SUBCUTANEOUS
  Administered 2024-06-11: 5 [IU] via SUBCUTANEOUS
  Administered 2024-06-12: 1 [IU] via SUBCUTANEOUS
  Administered 2024-06-12 – 2024-06-13 (×4): 2 [IU] via SUBCUTANEOUS
  Filled 2024-06-09 (×9): qty 1

## 2024-06-09 MED ORDER — DM-GUAIFENESIN ER 30-600 MG PO TB12: 1.0000 | ORAL_TABLET | Freq: Two times a day (BID) | ORAL | Status: DC | PRN

## 2024-06-09 MED ORDER — OXYCODONE-ACETAMINOPHEN 5-325 MG PO TABS
1.0000 | ORAL_TABLET | Freq: Once | ORAL | Status: AC
  Administered 2024-06-09: 1 via ORAL
  Filled 2024-06-09: qty 1

## 2024-06-09 MED ORDER — POLYSACCHARIDE IRON COMPLEX 150 MG PO CAPS: ORAL_CAPSULE | ORAL | Status: AC

## 2024-06-09 MED ORDER — ACETAMINOPHEN 325 MG PO TABS
650.0000 mg | ORAL_TABLET | Freq: Four times a day (QID) | ORAL | Status: DC | PRN
  Filled 2024-06-09: qty 2

## 2024-06-09 NOTE — Patient Instructions (Addendum)
 Please review the attached list of medications and notify my office if there are any errors.   Only take your iron  pill on Monday Wednesday and Fridays  Go to the [facility] on [address] for chest Xray

## 2024-06-09 NOTE — Patient Instructions (Signed)
 Visit Information  Thank you for taking time to visit with me today. Please don't hesitate to contact me if I can be of assistance to you before our next scheduled telephone appointment.  Our next appointment is by telephone on 06/15/24 at 2:45 pm  Following is a copy of your care plan:   Goals Addressed             This Visit's Progress    VBCI Transitions of Care (TOC) Care Plan       Problems:  Recent Hospitalization for treatment of COPD, Pulmonary Disease, and Pneumonia   Goal:  Over the next 30 days, the patient will not experience hospital readmission  Interventions:   COPD Interventions: Advised patient to self assesses COPD action plan zone and make appointment with provider if in the yellow zone for 48 hours without improvement Discussed the importance of adequate rest and management of fatigue with COPD Provided education about and advised patient to utilize infection prevention strategies to reduce risk of respiratory infection Use of home oxygen  and now uses prn and has CPAP  Patient Self Care Activities:  Attend all scheduled provider appointments Call pharmacy for medication refills 3-7 days in advance of running out of medications Call provider office for new concerns or questions  Notify RN Care Manager of TOC call rescheduling needs Participate in Transition of Care Program/Attend TOC scheduled calls  Plan:  Follow up and participate in 30 day weekly calls for the next 4 weeks. Contact your provider for any worsening symptoms with chills, fever, difficulty breathing        Patient verbalizes understanding of instructions and care plan provided today and agrees to view in MyChart. Active MyChart status and patient understanding of how to access instructions and care plan via MyChart confirmed with patient.     Telephone follow up appointment with care management team member scheduled for: The patient has been provided with contact information for the  care management team and has been advised to call with any health related questions or concerns.   Please call the care guide team at [phone] if you need to cancel or reschedule your appointment.   Please call the Suicide and Crisis Lifeline: 988 call the [place]  National Suicide Prevention Lifeline: 617-727-0143 or TTY: (702) 391-3198 TTY 321-001-6264) to talk to a trained counselor call 1-800-273-TALK (toll free, 24 hour hotline) if you are experiencing a Mental Health or Behavioral Health Crisis or need someone to talk to.  Richerd Fish, RN, BSN, CCM [facility] RN Care Manager Direct Dial: [phone]

## 2024-06-09 NOTE — Progress Notes (Signed)
 Established patient visit   Patient: Lawrence [name]   DOB: 08/19/1956   68 y.o. Male  MRN: [id number] Visit Date: 06/09/2024  Today's healthcare provider: Nancyann Perry, MD   Chief Complaint  Patient presents with   Hospitalization Follow-up    Admit date:05/29/2024 Discharge date: 07/17/225   Subjective    Discussed the use of AI scribe software for clinical note transcription with the patient, who gave verbal consent to proceed.  History of Present Illness   Lawrence [name] is a 68 year old male with atrial fibrillation and heart failure who presents for follow-up after hospitalization for pneumonia.  He was hospitalized from July 14th to July 17th for atrial fibrillation with chronic systolic and diastolic heart failure and right lower lobe pneumonia. During his hospitalization, he was treated with IV ceftriaxone , azithromycin , and solumedrol, and was discharged on Augmentin  and doxycycline , along with two days of outpatient prednisone . He also required IV diltiazem  for rate control of his atrial fibrillation and was discharged on his previous dose of metoprolol  and Eliquis .  Since discharge, he has experienced persistent nausea for the past four days, starting a couple of days after leaving the hospital. He initially vomited for two days but has since remained nauseated. He is not currently taking any medication for nausea. He continues to take iron  pills every morning.  He reports memory issues, such as difficulty remembering TV content during commercials and conversations with his grandson. He is currently taking alprazolam  and is scheduled for a MRI of his brain by Dr. Maree. .  He takes famotidine , pantoprazole  twice a day, and metoclopramide  for heartburn, but reports continues to have heartburn. He was previously followed by [facility], but is now followed at [facility].   He has a cough, sometimes productive of clear or greenish sputum. No recent changes to his medications  aside from the antibiotics prescribed during his hospitalization.       Medications: Outpatient Medications Prior to Visit  Medication Sig   Accu-Chek Softclix Lancets lancets Use as instructed to check sugar daily for type 2 diabetes.   albuterol  (PROVENTIL ) (2.5 MG/3ML) 0.083% nebulizer solution Take 3 mLs (2.5 mg total) by nebulization every 6 (six) hours as needed for wheezing or shortness of breath.   allopurinol  (ZYLOPRIM ) 300 MG tablet TAKE 1 TABLET BY MOUTH TWICE A DAY   aluminum -magnesium  hydroxide-simethicone  (MAALOX) 200-200-20 MG/5ML SUSP Take 30 mLs by mouth 3 (three) times daily as needed.   amLODipine  (NORVASC ) 10 MG tablet Take 10 mg by mouth daily.   apixaban  (ELIQUIS ) 5 MG TABS tablet Take 1 tablet (5 mg total) by mouth 2 (two) times daily.   atorvastatin  (LIPITOR ) 80 MG tablet TAKE 1 TABLET BY MOUTH AT BEDTIME   Blood Glucose Calibration (ACCU-CHEK GUIDE CONTROL) LIQD Use with blood glucose monitor as directed   Blood Glucose Monitoring Suppl (ACCU-CHEK GUIDE) w/Device KIT Use to check blood sugars as directed   BREZTRI  AEROSPHERE 160-9-4.8 MCG/ACT AERO inhaler INHALE 2 PUFFS BY MOUTH INTO THE LUNGS 2TIMES DAILY   carvedilol  (COREG ) 3.125 MG tablet TAKE 1 TABLET BY MOUTH TWICE A DAY   cetirizine  (ZYRTEC ) 10 MG tablet Take 1 tablet (10 mg total) by mouth at bedtime.   Cyanocobalamin  (VITAMIN B-12) 1000 MCG SUBL Place 1 tablet under the tongue daily.   dapagliflozin  propanediol (FARXIGA ) 10 MG TABS tablet TAKE 1 TABLET BY MOUTH DAILY   famotidine  (PEPCID ) 20 MG tablet Take 1 tablet (20 mg total) by mouth  2 (two) times daily.   furosemide  (LASIX ) 20 MG tablet Take 1 tablet (20 mg total) by mouth daily as needed.   gabapentin  (NEURONTIN ) 300 MG capsule Take 1 capsule (300 mg total) by mouth 2 (two) times daily.   glucose blood (ACCU-CHEK GUIDE) test strip Use as instructed to check sugar daily for type 2 diabetes.   isosorbide  mononitrate (IMDUR ) 120 MG 24 hr tablet Take 1  tablet (120 mg total) by mouth daily.   lithium  carbonate 150 MG capsule Take 150 mg by mouth 2 (two) times daily.   metFORMIN  (GLUCOPHAGE -XR) 500 MG 24 hr tablet TAKE 1 TABLET BY MOUTH DAILY WITH EVENING MEAL   metoprolol  succinate (TOPROL -XL) 25 MG 24 hr tablet Take 12.5 mg by mouth daily.   metroNIDAZOLE  (METROGEL ) 1 % gel Apply topically daily.   nitroGLYCERIN  (NITROSTAT ) 0.4 MG SL tablet Place 1 tablet (0.4 mg total) under the tongue every 5 (five) minutes as needed for chest pain (Do not take if blood pressure is low, systolic BP<110).   omega-3 acid ethyl esters (LOVAZA ) 1 g capsule TAKE 4 CAPSULES (4 GRAMS TOTAL) BY MOUTHDAILY   OXYGEN  Inhale 2 L into the lungs at bedtime as needed (for shortness of breath).   pantoprazole  (PROTONIX ) 40 MG tablet TAKE 1 TABLET BY MOUTH 2 TIMES DAILY (30MINUTES BEFORE A MEAL)   ranolazine  (RANEXA ) 1000 MG SR tablet Take 1 tablet (1,000 mg total) by mouth 2 (two) times daily.   sacubitril -valsartan  (ENTRESTO ) 24-26 MG Take 1 tablet by mouth 2 (two) times daily.   sennosides-docusate sodium  (SENOKOT-S) 8.6-50 MG tablet Take 1 tablet by mouth daily as needed for constipation.   spironolactone  (ALDACTONE ) 25 MG tablet Take 1 tablet (25 mg total) by mouth daily.   traZODone  (DESYREL ) 150 MG tablet Take 1 tablet (150 mg total) by mouth at bedtime as needed for sleep.   TRINTELLIX  10 MG TABS tablet Take 10 mg by mouth daily.   iron  polysaccharides (FERREX 150) 150 MG capsule TAKE 1 CAPSULE BY MOUTH DAILY   ALPRAZolam  (XANAX ) 1 MG tablet TAKE 1 TABLET BY MOUTH 3 TIMES A DAY AS NEEDED   methocarbamol  (ROBAXIN ) 500 MG tablet TAKE 1 TABLET BY MOUTH EVERY 8 HOURS AS NEEDED FOR MUSCLE SPASMS   metoCLOPramide  (REGLAN ) 10 MG tablet Take 1 tablet (10 mg total) by mouth 4 (four) times daily -  before meals and at bedtime for 15 days.   oxyCODONE -acetaminophen  (PERCOCET) 10-325 MG tablet TAKE 1 TABLET BY MOUTH EVERY 4 HOURS AS NEEDED FOR PAIN   No facility-administered  medications prior to visit.      Objective    BP 102/83 (BP Location: Left Arm, Patient Position: Sitting, Cuff Size: Normal)   Pulse 95   Resp 14   Wt 175 lb 3.2 oz (79.5 kg)   SpO2 98%   BMI 27.44 kg/m   Physical Exam   General: Appearance:    Well developed, well nourished male in no acute distress  Eyes:    PERRL, conjunctiva/corneas clear, EOM's intact       Lungs:     Clear to auscultation bilaterally, respirations unlabored  Heart:    Normal heart rate. Irregularly irregular rhythm. No murmurs, rubs, or gallops.    MS:   All extremities are intact.    Neurologic:   Awake, alert, oriented x 3. No apparent focal neurological defect.          Assessment & Plan       Pneumonia Recent right  lower lobe pneumonia treated inpatient with IV antibiotics and steroids. Completed outpatient antibiotic and prednisone .  Follow-up imaging needed. - Order follow-up chest x-ray to assess resolution.  Nausea Persistent nausea post-antibiotic and iron  use. - Reduce iron  supplementation to three times a week.  Atrial Fibrillation Managed with metoprolol  and Eliquis . Required IV diltiazem  during hospitalization. Follow cardiology as scheduled.   Gastroesophageal Reflux Disease (GERD) Managed with famotidine , pantoprazole , and metoclopramide . Current regimen may be excessive. No followed [facility].   Cognitive Impairment Memory issues reported. Neurologist ordered MRI scan brain per Dr. Maree. Anticipate weaning alprazolam  dose  Follow-up Follow-up plans discussed for imaging and medication adjustments. - Instruct him to obtain chest x-ray at [facility].         Nancyann Perry, MD  [facility] [phone] (phone) [phone] (fax)  [facility]

## 2024-06-09 NOTE — ED Provider Notes (Signed)
 [facility] Provider Note    Event Date/Time   First MD Initiated Contact with Patient 06/09/24 1828     (approximate)   History   Chief Complaint Fall and Back Pain   HPI  Lawrence [name] is a 68 y.o. male with past medical history of hypertension, diabetes, CAD, HFrEF, atrial fibrillation on Eliquis , and COPD who presents to the ED complaining of fall.  Patient reports that 2 days ago he began feeling dizzy and lightheaded, thinks he then lost consciousness because he remembers waking up on the ground.  He is not sure how long he was unconscious for, complains of headache, neck pain, and acute on chronic lower back pain since the fall.  He does state that he has had sharp discomfort in his chest since being admitted for pneumonia a few weeks ago, denies ongoing cough or difficulty breathing.  He saw his PCP earlier today, was advised to call 911 if he had worsening pain and so did so this evening.     Physical Exam   Triage Vital Signs: ED Triage Vitals  Encounter Vitals Group     BP 06/09/24 1825 (!) 141/96     Girls Systolic BP Percentile --      Girls Diastolic BP Percentile --      Boys Systolic BP Percentile --      Boys Diastolic BP Percentile --      Pulse Rate 06/09/24 1825 84     Resp 06/09/24 1825 16     Temp 06/09/24 1825 97.7 F (36.5 C)     Temp Source 06/09/24 1825 Oral     SpO2 06/09/24 1825 98 %     Weight 06/09/24 1826 176 lb 5.9 oz (80 kg)     Height 06/09/24 1826 5' 7 (1.702 m)     Head Circumference --      Peak Flow --      Pain Score 06/09/24 1826 8     Pain Loc --      Pain Education --      Exclude from Growth Chart --     Most recent vital signs: Vitals:   06/09/24 1825  BP: (!) 141/96  Pulse: 84  Resp: 16  Temp: 97.7 F (36.5 C)  SpO2: 98%    Constitutional: Alert and oriented. Eyes: Conjunctivae are normal. Head: Atraumatic. Nose: No congestion/rhinnorhea. Mouth/Throat: Mucous membranes are moist.   Neck: Midline cervical spine tenderness to palpation noted. Cardiovascular: Normal rate, regular rhythm. Grossly normal heart sounds.  2+ radial pulses bilaterally. Respiratory: Normal respiratory effort.  No retractions. Lungs CTAB.  No chest wall tenderness to palpation. Gastrointestinal: Soft and nontender. No distention. Musculoskeletal: No lower extremity tenderness nor edema.  No upper extremity bony tenderness to palpation.  Midline lumbar spinal tenderness to palpation. Neurologic:  Normal speech and language. No gross focal neurologic deficits are appreciated.    ED Results / Procedures / Treatments   Labs (all labs ordered are listed, but only abnormal results are displayed) Labs Reviewed  CBC WITH DIFFERENTIAL/PLATELET - Abnormal; Notable for the following components:      Result Value   RBC 6.00 (*)    Hemoglobin 17.1 (*)    HCT 52.4 (*)    RDW 18.6 (*)    Abs Immature Granulocytes 0.14 (*)    All other components within normal limits  COMPREHENSIVE METABOLIC PANEL WITH GFR - Abnormal; Notable for the following components:   Glucose, Bld 113 (*)  BUN 28 (*)    Total Protein 6.1 (*)    Albumin 3.4 (*)    AST 14 (*)    Total Bilirubin 1.4 (*)    All other components within normal limits  TROPONIN I (HIGH SENSITIVITY)     EKG  ED ECG REPORT I, Carlin Palin, the attending physician, personally viewed and interpreted this ECG.   Date: 06/09/2024  EKG Time: 18:54  Rate: 80  Rhythm: atrial fibrillation  Axis: Normal  Intervals:none  ST&T Change: None  RADIOLOGY CT head reviewed and interpreted by me with no hemorrhage or midline shift.  PROCEDURES:  Critical Care performed: No  Procedures   MEDICATIONS ORDERED IN ED: Medications  oxyCODONE -acetaminophen  (PERCOCET/ROXICET) 5-325 MG per tablet 1 tablet (1 tablet Oral Given 06/09/24 1935)     IMPRESSION / MDM / ASSESSMENT AND PLAN / ED COURSE  I reviewed the triage vital signs and the nursing  notes.                              68 y.o. male with past medical history of hypertension, diabetes, CAD, CHF, atrial fibrillation on Eliquis , and COPD who presents to the ED complaining of syncopal episode 2 days ago where he fell and struck his head, reports neck and back pain.  Patient's presentation is most consistent with acute presentation with potential threat to life or bodily function.  Differential diagnosis includes, but is not limited to, arrhythmia, ACS, pneumonia, pneumothorax, intracranial injury, cervical spine injury, anemia, electrolyte abnormality, AKI, orthostatic hypotension, vasovagal episode.  Patient nontoxic-appearing and in no acute distress, vital signs are unremarkable.  He has a nonfocal neurologic exam, will check CT head and cervical spine given fall with cervical spine tenderness.  EKG shows atrial fibrillation with controlled rate, no ischemic changes noted.  Given syncopal episode, will observe on cardiac monitor and check troponin.  Chest x-ray and additional labs are pending.  CT head and cervical spine are negative for acute process, chest x-ray and lumbar x-ray are also unremarkable.  Labs without significant anemia, leukocytosis, electrolyte abnormality, or AKI.  LFTs are unremarkable, troponin within normal limits.  Patient high risk for cardiac etiology of syncope and case discussed with hospitalist for admission.      FINAL CLINICAL IMPRESSION(S) / ED DIAGNOSES   Final diagnoses:  Syncope, unspecified syncope type     Rx / DC Orders   ED Discharge Orders     None        Note:  This document was prepared using Dragon voice recognition software and may include unintentional dictation errors.   Palin Carlin, MD 06/09/24 2258

## 2024-06-09 NOTE — Transitions of Care (Post Inpatient/ED Visit) (Signed)
 Transition of Care week 2  Visit Note  06/09/2024  Name: Lawrence [name] MRN: [id number]          DOB: 1955/11/29  Situation: Patient enrolled in [facility] 30-day program. Visit completed with patient by telephone.   Background:   Initial Transition Care Management Follow-up Telephone Call    Past Medical History:  Diagnosis Date   A-fib (HCC)    Anemia    Anginal pain (HCC)    Anxiety    Arthritis    Asthma    CAD (coronary artery disease)    a. 2002 CABGx2 (LIMA->LAD, VG->VG->OM1);  b. 09/2012 DES->OM;  c. 03/2015 PTCA of LAD [facility]) in setting of atretic LIMA; d. 05/2015 Cath [facility]): nonobs dzs; e. 06/2015 Cath ([facility]): LM nl, LAD 45p/d ISR, 50d, D1/2 small, LCX 50p/d ISR, OM1 70ost, 30 ISR, VG->OM1 50ost, 71m, LIMA->LAD 99p/d - atretic, RCA dom, nl; f.cath 10/16: 40-50%(FFR 0.90) pLAD, 75% (FFR 0.77) mLAD s/p PCI/DES, oRCA 40% (FFR0.95)   Cancer (HCC)    SKIN CANCER ON BACK   Celiac disease    Chronic diastolic CHF (congestive heart failure) (HCC)    a. 06/2009 Echo: EF 60-65%, Gr 1 DD, triv AI, mildly dil LA, nl RV.   COPD (chronic obstructive pulmonary disease) (HCC)    a. Chronic bronchitis and emphysema.   DDD (degenerative disc disease), lumbar    Diverticulosis    Dysrhythmia    Essential hypertension    GERD (gastroesophageal reflux disease)    History of hiatal hernia    History of kidney stones    H/O   History of tobacco abuse    a. Quit 2014.   Myocardial infarction [facility]) 2002   4 STENTS   Pancreatitis    PSVT (paroxysmal supraventricular tachycardia) (HCC)    a. 10/2012 Noted on Zio Patch.   Sleep apnea    LOST CORD TO CPAP -ONLY 02 @ BEDTIME   Tubular adenoma of colon    Type II diabetes mellitus (HCC)     Assessment: Patient Reported Symptoms: Cognitive Cognitive Status: Alert and oriented to person, place, and time, Struggling with memory recall Other: 06/14/24 will have MRI of Brain for dementia   Health Maintenance Behaviors: Exercise, Healthy  diet Health Facilitated by: Pain control  Neurological Neurological Review of Symptoms: Other: Neurological Self-Management Outcome: 3 (uncertain) Neurological Comment: Memeory loss  HEENT HEENT Symptoms Reported: No symptoms reported HEENT Self-Management Outcome: 4 (good)    Cardiovascular Cardiovascular Symptoms Reported: Chest pain or discomfort Does patient have uncontrolled Hypertension?: Yes Is patient checking Blood Pressure at home?: Yes Patient's Recent BP reading at home: 117/83 Cardiovascular Management Strategies: Activity, Adequate rest, Medication therapy Weight: 175 lb (79.4 kg) Cardiovascular Self-Management Outcome: 3 (uncertain)  Respiratory Respiratory Symptoms Reported: No symptoms reported Other Respiratory Symptoms: SOB with exertion Additional Respiratory Details: Has CPAP now Respiratory Management Strategies: Oxygen  therapy  Endocrine Endocrine Symptoms Reported: Nausea or vomiting Is patient diabetic?: Yes Is patient checking blood sugars at home?: Yes List most recent blood sugar readings, include date and time of day: 120 Endocrine Self-Management Outcome: 4 (good)  Gastrointestinal Gastrointestinal Symptoms Reported: Nausea Additional Gastrointestinal Details: Spoke with PCP this morning and adjusted Iron  to MWF Gastrointestinal Management Strategies: Medication therapy, Activity, Adequate rest Gastrointestinal Self-Management Outcome: 4 (good)    Genitourinary Genitourinary Symptoms Reported: Incontinence Additional Genitourinary Details: Wear adult garmets Genitourinary Management Strategies: Incontinence garment/pad, Medication therapy, Adequate rest Genitourinary Comment: hospital bed  Integumentary Integumentary Symptoms Reported: Bruising Other Integumentary Symptoms:  skin discoloration, bumps time to time Skin Management Strategies: Medication therapy (creams/gels) Skin Self-Management Outcome: 3 (uncertain)  Musculoskeletal Musculoskelatal  Symptoms Reviewed: No symptoms reported Additional Musculoskeletal Details: unsteady gait Musculoskeletal Management Strategies: Medical device Musculoskeletal Self-Management Outcome: 3 (uncertain) Falls in the past year?: Yes Number of falls in past year: 2 or more Was there an injury with Fall?: No Fall Risk Category Calculator: 2 Patient Fall Risk Level: Moderate Fall Risk Fall risk Follow up: Education provided  Psychosocial Psychosocial Symptoms Reported: No symptoms reported Additional Psychological Details: on medication         Vitals:   06/09/24 1530  BP: 117/83  Pulse: 90    Medications Reviewed Today     Reviewed by Eilleen Richerd GRADE, RN (Registered Nurse) on 06/09/24 at 1527  Med List Status: <None>   Medication Order Taking? Sig Documenting Provider Last Dose Status Informant  Accu-Chek Softclix Lancets lancets [id number] Yes Use as instructed to check sugar daily for type 2 diabetes. [name], MD  Active Self  albuterol  (PROVENTIL ) (2.5 MG/3ML) 0.083% nebulizer solution [id number] Yes Take 3 mLs (2.5 mg total) by nebulization every 6 (six) hours as needed for wheezing or shortness of breath. [name], MD  Active Self  allopurinol  (ZYLOPRIM ) 300 MG tablet [id number] Yes TAKE 1 TABLET BY MOUTH TWICE A DAY [name], MD  Active Self  ALPRAZolam  (XANAX ) 1 MG tablet [id number] Yes TAKE 1 TABLET BY MOUTH 3 TIMES A DAY AS NEEDED [name], MD  Active Self  aluminum -magnesium  hydroxide-simethicone  (MAALOX) 200-200-20 MG/5ML SUSP [id number] Yes Take 30 mLs by mouth 3 (three) times daily as needed. [name], MD  Active   amLODipine  (NORVASC ) 10 MG tablet [id number] Yes Take 10 mg by mouth daily. [provider]  Active Self  apixaban  (ELIQUIS ) 5 MG TABS tablet [id number] Yes Take 1 tablet (5 mg total) by mouth 2 (two) times daily. [name], MD  Active Self  atorvastatin  (LIPITOR ) 80 MG tablet [id number] Yes TAKE 1 TABLET BY MOUTH AT  BEDTIME [name], MD  Active Self  Blood Glucose Calibration (ACCU-CHEK GUIDE CONTROL) [name] [id number] Yes Use with blood glucose monitor as directed [name], MD  Active Self  Blood Glucose Monitoring Suppl (ACCU-CHEK GUIDE) w/Device KIT [id number] Yes Use to check blood sugars as directed [name], MD  Active Self  BREZTRI  AEROSPHERE 160-9-4.8 MCG/ACT AERO inhaler [id number] Yes INHALE 2 PUFFS BY MOUTH INTO THE LUNGS 2TIMES DAILY [name], MD  Active   carvedilol  (COREG ) 3.125 MG tablet [id number] Yes TAKE 1 TABLET BY MOUTH TWICE A DAY [name], MD  Active Self  cetirizine  (ZYRTEC ) 10 MG tablet [id number] Yes Take 1 tablet (10 mg total) by mouth at bedtime. [name], MD  Active Self  Cyanocobalamin  (VITAMIN B-12) 1000 MCG SUBL [id number] Yes Place 1 tablet under the tongue daily. [provider]  Active Self  dapagliflozin  propanediol (FARXIGA ) 10 MG TABS tablet [id number] Yes TAKE 1 TABLET BY MOUTH DAILY [name], MD  Active Self  famotidine  (PEPCID ) 20 MG tablet [id number] Yes Take 1 tablet (20 mg total) by mouth 2 (two) times daily. [name], MD  Active Self  furosemide  (LASIX ) 20 MG tablet [id number] Yes Take 1 tablet (20 mg total) by mouth daily as needed. [name], MD  Active Self  gabapentin  (NEURONTIN ) 300 MG capsule [id number] Yes Take 1 capsule (300 mg total) by mouth 2 (two) times  daily. [name], MD  Active Self  glucose blood (ACCU-CHEK GUIDE) test strip [id number] Yes Use as instructed to check sugar daily for type 2 diabetes. [name], MD  Active Self  iron  polysaccharides [facility] 150) 150 MG capsule [id number] Yes Take one capsul every Monday, Wednesday, and Friday [name], MD  Active   isosorbide  mononitrate (IMDUR ) 120 MG 24 hr tablet [id number] Yes Take 1 tablet (120 mg total) by mouth daily. Von Bellis, MD  Active Self  lithium  carbonate 150 MG capsule [id number] Yes Take 150 mg by mouth  2 (two) times daily. [provider]  Active Self  metFORMIN  (GLUCOPHAGE -XR) 500 MG 24 hr tablet [id number] Yes TAKE 1 TABLET BY MOUTH DAILY WITH EVENING MEAL [name], MD  Active Self  methocarbamol  (ROBAXIN ) 500 MG tablet [id number] Yes TAKE 1 TABLET BY MOUTH EVERY 8 HOURS AS NEEDED FOR MUSCLE SPASMS [name], MD  Active Self  metoCLOPramide  (REGLAN ) 10 MG tablet [id number]  Take 1 tablet (10 mg total) by mouth 4 (four) times daily -  before meals and at bedtime for 15 days.  Patient not taking: Reported on 06/09/2024   [name], MD  Expired 03/27/24 2359   metoprolol  succinate (TOPROL -XL) 25 MG 24 hr tablet [id number] Yes Take 12.5 mg by mouth daily. [provider]  Active Self  metroNIDAZOLE  (METROGEL ) 1 % gel [id number] Yes Apply topically daily. [name], MD  Active Self  nitroGLYCERIN  (NITROSTAT ) 0.4 MG SL tablet [id number] Yes Place 1 tablet (0.4 mg total) under the tongue every 5 (five) minutes as needed for chest pain (Do not take if blood pressure is low, systolic BP<110). [name], MD  Active Self           Med Note HALLIE, [facility]   Tue Mar 21, 2024 12:58 PM) Needs new order  omega-3 acid ethyl esters (LOVAZA ) 1 g capsule [id number] Yes TAKE 4 CAPSULES (4 GRAMS TOTAL) BY WARDEN [name], MD  Active Self  oxyCODONE -acetaminophen  (PERCOCET) 10-325 MG tablet [id number] Yes TAKE 1 TABLET BY MOUTH EVERY 4 HOURS AS NEEDED FOR PAIN [name], MD  Active Self  OXYGEN  [id number] Yes Inhale 2 L into the lungs at bedtime as needed (for shortness of breath). [provider]  Active Self  pantoprazole  (PROTONIX ) 40 MG tablet [id number] Yes TAKE 1 TABLET BY MOUTH 2 TIMES DAILY (30MINUTES BEFORE A MEAL) [name], MD  Active Self  ranolazine  (RANEXA ) 1000 MG SR tablet [id number] Yes Take 1 tablet (1,000 mg total) by mouth 2 (two) times daily. Darci Pore, MD  Active Self  sacubitril -valsartan  (ENTRESTO ) 24-26  MG [id number] Yes Take 1 tablet by mouth 2 (two) times daily. [provider]  Active Self  sennosides-docusate sodium  (SENOKOT-S) 8.6-50 MG tablet [id number] Yes Take 1 tablet by mouth daily as needed for constipation. [name], MD  Active   spironolactone  (ALDACTONE ) 25 MG tablet [id number] Yes Take 1 tablet (25 mg total) by mouth daily. [name], MD  Active Self  traZODone  (DESYREL ) 150 MG tablet [id number] Yes Take 1 tablet (150 mg total) by mouth at bedtime as needed for sleep. [name], MD  Active Self  TRINTELLIX  10 MG TABS tablet [id number] Yes Take 10 mg by mouth daily. [provider]  Active Self            Recommendation:   Continue Current Plan of Care  Richerd Fish, RN, BSN, CCM [employer]  [facility], [facility] RN Care Manager Direct Dial: [phone]

## 2024-06-09 NOTE — ED Triage Notes (Addendum)
 Pt to ER via EMS from home.  States 2 days ago became dizzy while walking and fell, did not hit head but had positive LOC after fall.  Pt reports upper back, head and neck pain since fall.  Pt was seen by PCP this AM and was told to rest at home and if pain got worse to call 911.  Pt was ambulatory with walker for EMS without difficulty. Pt has chronic back pain, take oxycodone  for same.

## 2024-06-09 NOTE — H&P (Signed)
 History and Physical    Lawrence [name] FMW:[id number] DOB: 08/28/1956 DOA: 06/09/2024  Referring MD/NP/PA:   PCP: Lawrence Nancyann BRAVO, MD   Patient coming from:  The patient is coming from home.     Chief Complaint: Syncope, chest pain  HPI: Lawrence [name] is a 68 y.o. male with medical history significant of f CAD (s/p of stent x 4 and CBAG 2016), sCHF with EF 30-35%, a fib on Eliquis , PSVT,  HTN, HLD, DM, COPD on 2L O2, OSA not using CPAP, dCHF, anxiety, celiac disease, pancreatitis, who presents with syncope and chest pain  Patient was recently hospitalized from 7/14 - 7/17 due to pneumonia and COPD exacerbation.  Patient was discharged on Augmentin  and doxycycline .  He states that he completed the course of antibiotics.  Patient states that he started feeling dizzy and lightheaded around 8:00 PM, then passed out briefly.  He denies unilateral numbness or tingling in extremities.  No facial droop or slurred speech.  He reports chest pain, which is located in the front chest, constant, sharp, pleuritic, 9 out of 10 in severity, nonradiating.  Associated with mild SOB and mild dry cough.  He states that this chest pain is new, which is different from the chest pain due to recent pneumonia.  He has nausea, no vomiting, diarrhea or abdominal pain.  No symptoms of UTI.  No fever or chills.  He reports chronic back pain.  Has new neck pain after syncope.  Data reviewed independently and ED Course: pt was found to have WBC 8.6, GFR> 60, troponin 12, temperature normal, blood pressure 141/96, heart rate 95, RR 18, oxygen  saturation 98% on room air.  Chest x-ray negative.  CT of head negative for acute intracranial abnormalities.  CT of C-spine negative for acute injury, did show the degenerative disc disease.  X-ray of her lumbar spine is negative for acute injury, did show the degenerative disc disease.  Patient is placed into any bed for observation.   EKG: I have personally reviewed.  A-fib, QTc  408, Q waves in inferior leads.   Review of Systems:   General: no fevers, chills, no body weight gain, has fatigue HEENT: no blurry vision, hearing changes or sore throat Respiratory: Has mild dyspnea, coughing, no wheezing CV: Has chest pain, no palpitations GI: Has nausea, no vomiting, abdominal pain, diarrhea, constipation GU: no dysuria, burning on urination, increased urinary frequency, hematuria  Ext: no leg edema Neuro: no unilateral weakness, numbness, or tingling, no vision change or hearing loss.  Has dizziness and syncope Skin: no rash, no skin tear. MSK: No muscle spasm, no deformity, no limitation of range of movement in spin Heme: No easy bruising.  Travel history: No recent long distant travel.   Allergy:  Allergies  Allergen Reactions   Demerol  [Meperidine Hcl]    Demerol [Meperidine] Hives   Sulfa Antibiotics Hives   Empagliflozin  Other (See Comments) and Hives    Perineal pain  Other Reaction(s): Other (See Comments)   Morphine  Sulfate Nausea And Vomiting, Rash and Other (See Comments)    Pt states that he is only allergic to the tablet form of this medication.      Past Medical History:  Diagnosis Date   A-fib (HCC)    Anemia    Anginal pain (HCC)    Anxiety    Arthritis    Asthma    CAD (coronary artery disease)    a. 2002 CABGx2 (LIMA->LAD, VG->VG->OM1);  b. 09/2012 DES->OM;  c. 03/2015 PTCA of LAD [facility]) in setting of atretic LIMA; d. 05/2015 Cath [facility]): nonobs dzs; e. 06/2015 Cath ([facility]): LM nl, LAD 45p/d ISR, 50d, D1/2 small, LCX 50p/d ISR, OM1 70ost, 30 ISR, VG->OM1 50ost, 17m, LIMA->LAD 99p/d - atretic, RCA dom, nl; f.cath 10/16: 40-50%(FFR 0.90) pLAD, 75% (FFR 0.77) mLAD s/p PCI/DES, oRCA 40% (FFR0.95)   Cancer (HCC)    SKIN CANCER ON BACK   Celiac disease    Chronic diastolic CHF (congestive heart failure) (HCC)    a. 06/2009 Echo: EF 60-65%, Gr 1 DD, triv AI, mildly dil LA, nl RV.   COPD (chronic obstructive pulmonary disease) (HCC)    a.  Chronic bronchitis and emphysema.   DDD (degenerative disc disease), lumbar    Diverticulosis    Dysrhythmia    Essential hypertension    GERD (gastroesophageal reflux disease)    History of hiatal hernia    History of kidney stones    H/O   History of tobacco abuse    a. Quit 2014.   Myocardial infarction [facility]) 2002   4 STENTS   Pancreatitis    PSVT (paroxysmal supraventricular tachycardia) (HCC)    a. 10/2012 Noted on Zio Patch.   Sleep apnea    LOST CORD TO CPAP -ONLY 02 @ BEDTIME   Tubular adenoma of colon    Type II diabetes mellitus (HCC)     Past Surgical History:  Procedure Laterality Date   BYPASS GRAFT     CARDIAC CATHETERIZATION N/A 07/12/2015   rocedure: Left Heart Cath and Cors/Grafts Angiography;  Surgeon: Victory LELON Sharps, MD;  Location: [facility] INVASIVE CV LAB;  Service: Cardiovascular;  Laterality: N/A;   CARDIAC CATHETERIZATION Right 10/07/2015   Procedure: Left Heart Cath and Cors/Grafts Angiography;  Surgeon: Denyse DELENA Bathe, MD;  Location: [facility] INVASIVE CV LAB;  Service: Cardiovascular;  Laterality: Right;   CARDIAC CATHETERIZATION N/A 04/06/2016   Procedure: Left Heart Cath and Coronary Angiography;  Surgeon: Cara JONETTA Lovelace, MD;  Location: [facility] INVASIVE CV LAB;  Service: Cardiovascular;  Laterality: N/A;   CARDIAC CATHETERIZATION  04/06/2016   Procedure: Bypass Graft Angiography;  Surgeon: Cara JONETTA Lovelace, MD;  Location: [facility] INVASIVE CV LAB;  Service: Cardiovascular;;   CARDIAC CATHETERIZATION N/A 11/02/2016   Procedure: Left Heart Cath and Cors/Grafts Angiography and possible PCI;  Surgeon: Cara JONETTA Lovelace, MD;  Location: [facility] INVASIVE CV LAB;  Service: Cardiovascular;  Laterality: N/A;   CARDIAC CATHETERIZATION N/A 11/02/2016   Procedure: Coronary Stent Intervention;  Surgeon: Cara JONETTA Lovelace, MD;  Location: [facility] INVASIVE CV LAB;  Service: Cardiovascular;  Laterality: N/A;   CARDIOVERSION N/A 06/29/2022   Procedure: CARDIOVERSION;  Surgeon: Hester Wolm PARAS, MD;  Location: [facility] ORS;  Service: Cardiovascular;  Laterality: N/A;   CHOLECYSTECTOMY     CIRCUMCISION N/A 06/09/2019   Procedure: CIRCUMCISION ADULT;  Surgeon: Francisca Redell BROCKS, MD;  Location: [facility] ORS;  Service: Urology;  Laterality: N/A;   COLONOSCOPY WITH PROPOFOL  N/A 04/01/2018   Procedure: COLONOSCOPY WITH PROPOFOL ;  Surgeon: Viktoria Lamar DASEN, MD;  Location: [facility] ENDOSCOPY;  Service: Endoscopy;  Laterality: N/A;   COLONOSCOPY WITH PROPOFOL  N/A 05/01/2023   Procedure: COLONOSCOPY WITH PROPOFOL ;  Surgeon: Unk Corinn Skiff, MD;  Location: [facility] ENDOSCOPY;  Service: Gastroenterology;  Laterality: N/A;   ESOPHAGEAL DILATION     ESOPHAGOGASTRODUODENOSCOPY (EGD) WITH PROPOFOL  N/A 04/01/2018   Procedure: ESOPHAGOGASTRODUODENOSCOPY (EGD) WITH PROPOFOL ;  Surgeon: Viktoria Lamar DASEN, MD;  Location: [facility] ENDOSCOPY;  Service: Endoscopy;  Laterality: N/A;   ESOPHAGOGASTRODUODENOSCOPY (  EGD) WITH PROPOFOL  N/A 05/01/2023   Procedure: ESOPHAGOGASTRODUODENOSCOPY (EGD) WITH PROPOFOL ;  Surgeon: Unk Corinn Skiff, MD;  Location: [facility] ENDOSCOPY;  Service: Gastroenterology;  Laterality: N/A;   GIVENS CAPSULE STUDY  05/01/2023   Procedure: GIVENS CAPSULE STUDY;  Surgeon: Unk Corinn Skiff, MD;  Location: [facility] ENDOSCOPY;  Service: Gastroenterology;;   LEFT HEART CATH AND CORS/GRAFTS ANGIOGRAPHY N/A 06/12/2019   Procedure: LEFT HEART CATH AND CORS/GRAFTS ANGIOGRAPHY;  Surgeon: Bosie Vinie LABOR, MD;  Location: [facility] INVASIVE CV LAB;  Service: Cardiovascular;  Laterality: N/A;   LEFT HEART CATH AND CORS/GRAFTS ANGIOGRAPHY N/A 03/11/2020   Procedure: LEFT HEART CATH AND CORS/GRAFTS ANGIOGRAPHY;  Surgeon: Ammon Blunt, MD;  Location: [facility] INVASIVE CV LAB;  Service: Cardiovascular;  Laterality: N/A;   LEFT HEART CATH AND CORS/GRAFTS ANGIOGRAPHY N/A 05/01/2021   Procedure: LEFT HEART CATH AND CORS/GRAFTS ANGIOGRAPHY;  Surgeon: Hester Wolm PARAS, MD;  Location: [facility] INVASIVE CV LAB;  Service: Cardiovascular;   Laterality: N/A;   LEFT HEART CATH AND CORS/GRAFTS ANGIOGRAPHY N/A 11/02/2022   Procedure: LEFT HEART CATH AND CORS/GRAFTS ANGIOGRAPHY;  Surgeon: Florencio Cara BIRCH, MD;  Location: [facility] INVASIVE CV LAB;  Service: Cardiovascular;  Laterality: N/A;   TONSILLECTOMY     VASCULAR SURGERY      Social History:  reports that he quit smoking about 11 years ago. His smoking use included cigarettes. He started smoking about 61 years ago. He has a 150 pack-year smoking history. He has never used smokeless tobacco. He reports that he does not currently use drugs after having used the following drugs: Marijuana. He reports that he does not drink alcohol.  Family History:  Family History  Problem Relation Age of Onset   Heart attack Mother    Depression Mother    Heart disease Mother    COPD Mother    Hypertension Mother    Heart attack Father    Diabetes Father    Depression Father    Heart disease Father    Cirrhosis Father    Parkinson's disease Brother      Prior to Admission medications   Medication Sig Start Date End Date Taking? Authorizing Provider  Accu-Chek Softclix Lancets lancets Use as instructed to check sugar daily for type 2 diabetes. 03/03/21   Lawrence Nancyann BRAVO, MD  albuterol  (PROVENTIL ) (2.5 MG/3ML) 0.083% nebulizer solution Take 3 mLs (2.5 mg total) by nebulization every 6 (six) hours as needed for wheezing or shortness of breath. 05/26/24   Lawrence Nancyann BRAVO, MD  allopurinol  (ZYLOPRIM ) 300 MG tablet TAKE 1 TABLET BY MOUTH TWICE A DAY 04/12/24   Lawrence Nancyann BRAVO, MD  ALPRAZolam  (XANAX ) 1 MG tablet TAKE 1 TABLET BY MOUTH 3 TIMES A DAY AS NEEDED 03/29/24   Lawrence Nancyann BRAVO, MD  aluminum -magnesium  hydroxide-simethicone  (MAALOX) 200-200-20 MG/5ML SUSP Take 30 mLs by mouth 3 (three) times daily as needed. 06/01/24   [name], MD  amLODipine  (NORVASC ) 10 MG tablet Take 10 mg by mouth daily.    [provider]  apixaban  (ELIQUIS ) 5 MG TABS tablet Take 1 tablet (5 mg total) by  mouth 2 (two) times daily. 10/04/23   Lawrence Nancyann BRAVO, MD  atorvastatin  (LIPITOR ) 80 MG tablet TAKE 1 TABLET BY MOUTH AT BEDTIME 07/15/23   Lawrence Nancyann BRAVO, MD  Blood Glucose Calibration (ACCU-CHEK GUIDE CONTROL) LIQD Use with blood glucose monitor as directed 12/01/23   Lawrence Nancyann BRAVO, MD  Blood Glucose Monitoring Suppl (ACCU-CHEK GUIDE) w/Device KIT Use to check blood sugars as directed 12/01/23  Lawrence Nancyann BRAVO, MD  BREZTRI  AEROSPHERE 160-9-4.8 MCG/ACT AERO inhaler INHALE 2 PUFFS BY MOUTH INTO THE LUNGS 2TIMES DAILY 06/05/24   Lawrence Nancyann BRAVO, MD  carvedilol  (COREG ) 3.125 MG tablet TAKE 1 TABLET BY MOUTH TWICE A DAY 03/22/24   Lawrence Nancyann BRAVO, MD  cetirizine  (ZYRTEC ) 10 MG tablet Take 1 tablet (10 mg total) by mouth at bedtime. 03/31/24   Lawrence Nancyann BRAVO, MD  Cyanocobalamin  (VITAMIN B-12) 1000 MCG SUBL Place 1 tablet under the tongue daily.    [provider]  dapagliflozin  propanediol (FARXIGA ) 10 MG TABS tablet TAKE 1 TABLET BY MOUTH DAILY 03/28/24   Lawrence Nancyann BRAVO, MD  famotidine  (PEPCID ) 20 MG tablet Take 1 tablet (20 mg total) by mouth 2 (two) times daily. 04/10/24   Lawrence Nancyann BRAVO, MD  furosemide  (LASIX ) 20 MG tablet Take 1 tablet (20 mg total) by mouth daily as needed. 04/18/24   Lawrence Nancyann BRAVO, MD  gabapentin  (NEURONTIN ) 300 MG capsule Take 1 capsule (300 mg total) by mouth 2 (two) times daily. 05/22/24   Lawrence Nancyann BRAVO, MD  glucose blood (ACCU-CHEK GUIDE) test strip Use as instructed to check sugar daily for type 2 diabetes. 03/03/21   Lawrence Nancyann BRAVO, MD  iron  polysaccharides (FERREX 150) 150 MG capsule Take one capsul every Monday, Wednesday, and Friday 06/09/24   Lawrence Nancyann BRAVO, MD  isosorbide  mononitrate (IMDUR ) 120 MG 24 hr tablet Take 1 tablet (120 mg total) by mouth daily. 02/10/24 02/09/25  Von Bellis, MD  lithium  carbonate 150 MG capsule Take 150 mg by mouth 2 (two) times daily. 03/24/24   [provider]  metFORMIN  (GLUCOPHAGE -XR) 500 MG 24 hr tablet TAKE 1  TABLET BY MOUTH DAILY WITH EVENING MEAL 01/12/24   Lawrence Nancyann BRAVO, MD  methocarbamol  (ROBAXIN ) 500 MG tablet TAKE 1 TABLET BY MOUTH EVERY 8 HOURS AS NEEDED FOR MUSCLE SPASMS 01/13/24   Lawrence Nancyann BRAVO, MD  metoCLOPramide  (REGLAN ) 10 MG tablet Take 1 tablet (10 mg total) by mouth 4 (four) times daily -  before meals and at bedtime for 15 days. Patient not taking: Reported on 06/09/2024 03/12/24 03/27/24  [name], MD  metoprolol  succinate (TOPROL -XL) 25 MG 24 hr tablet Take 12.5 mg by mouth daily. 11/04/23   [provider]  metroNIDAZOLE  (METROGEL ) 1 % gel Apply topically daily. 05/26/24   Lawrence Nancyann BRAVO, MD  nitroGLYCERIN  (NITROSTAT ) 0.4 MG SL tablet Place 1 tablet (0.4 mg total) under the tongue every 5 (five) minutes as needed for chest pain (Do not take if blood pressure is low, systolic BP<110). 10/21/23   [name], MD  omega-3 acid ethyl esters (LOVAZA ) 1 g capsule TAKE 4 CAPSULES (4 GRAMS TOTAL) BY MOUTHDAILY 03/02/24   Lawrence Nancyann BRAVO, MD  oxyCODONE -acetaminophen  (PERCOCET) 10-325 MG tablet TAKE 1 TABLET BY MOUTH EVERY 4 HOURS AS NEEDED FOR PAIN 05/24/24   Lawrence Nancyann BRAVO, MD  OXYGEN  Inhale 2 L into the lungs at bedtime as needed (for shortness of breath).    [provider]  pantoprazole  (PROTONIX ) 40 MG tablet TAKE 1 TABLET BY MOUTH 2 TIMES DAILY (30MINUTES BEFORE A MEAL) 01/12/24   Lawrence Nancyann BRAVO, MD  ranolazine  (RANEXA ) 1000 MG SR tablet Take 1 tablet (1,000 mg total) by mouth 2 (two) times daily. 08/05/23   Darci Pore, MD  sacubitril -valsartan  (ENTRESTO ) 24-26 MG Take 1 tablet by mouth 2 (two) times daily.    [provider]  sennosides-docusate sodium  (SENOKOT-S) 8.6-50 MG tablet Take 1 tablet  by mouth daily as needed for constipation. 06/01/24   [name], MD  spironolactone  (ALDACTONE ) 25 MG tablet Take 1 tablet (25 mg total) by mouth daily. 03/10/24   Lawrence Nancyann BRAVO, MD  traZODone  (DESYREL ) 150 MG tablet Take 1 tablet (150 mg  total) by mouth at bedtime as needed for sleep. 03/31/24   Lawrence Nancyann BRAVO, MD  TRINTELLIX  10 MG TABS tablet Take 10 mg by mouth daily.    [provider]    Physical Exam: Vitals:   06/10/24 0008 06/10/24 0009 06/10/24 0010 06/10/24 0013  BP: (!) 141/97 134/84 (!) 144/85   Pulse: 80     Resp:      Temp: (!) 97.5 F (36.4 C)     TempSrc: Oral     SpO2:      Weight:    79.5 kg  Height:    5' 7 (1.702 m)   General: Not in acute distress HEENT:       Eyes: PERRL, EOMI, no jaundice       ENT: No discharge from the ears and nose, no pharynx injection, no tonsillar enlargement.        Neck: No JVD, no bruit, no mass felt. Heme: No neck lymph node enlargement. Cardiac: S1/S2, irregularly irregular rhythm, no murmurs, No gallops or rubs. Respiratory: No rales, wheezing, rhonchi or rubs. GI: Soft, nondistended, nontender, no rebound pain, no organomegaly, BS present. GU: No hematuria Ext: No pitting leg edema bilaterally. 1+DP/PT pulse bilaterally. Musculoskeletal: No joint deformities, No joint redness or warmth, no limitation of ROM in spin. Skin: No rashes.  Neuro: Alert, oriented X3, cranial nerves II-XII grossly intact, moves all extremities normally. Muscle strength 5/5 in all extremities, sensation to light touch intact.  Psych: Patient is not psychotic, no suicidal or hemocidal ideation.  Labs on Admission: I have personally reviewed following labs and imaging studies  CBC: Recent Labs  Lab 06/09/24 2025  WBC 8.6  NEUTROABS 5.7  HGB 17.1*  HCT 52.4*  MCV 87.3  PLT 180   Basic Metabolic Panel: Recent Labs  Lab 06/09/24 2215  NA 139  K 4.6  CL 101  CO2 28  GLUCOSE 113*  BUN 28*  CREATININE 1.12  CALCIUM  9.7   GFR: Estimated Creatinine Clearance: 62.1 mL/min (by C-G formula based on SCr of 1.12 mg/dL). Liver Function Tests: Recent Labs  Lab 06/09/24 2215  AST 14*  ALT 18  ALKPHOS 48  BILITOT 1.4*  PROT 6.1*  ALBUMIN 3.4*   No results for  input(s): LIPASE, AMYLASE in the last 168 hours. No results for input(s): AMMONIA in the last 168 hours. Coagulation Profile: No results for input(s): INR, PROTIME in the last 168 hours. Cardiac Enzymes: No results for input(s): CKTOTAL, CKMB, CKMBINDEX, TROPONINI in the last 168 hours. BNP (last 3 results) No results for input(s): PROBNP in the last 8760 hours. HbA1C: No results for input(s): HGBA1C in the last 72 hours. CBG: No results for input(s): GLUCAP in the last 168 hours. Lipid Profile: No results for input(s): CHOL, HDL, LDLCALC, TRIG, CHOLHDL, LDLDIRECT in the last 72 hours. Thyroid  Function Tests: No results for input(s): TSH, T4TOTAL, FREET4, T3FREE, THYROIDAB in the last 72 hours. Anemia Panel: No results for input(s): VITAMINB12, FOLATE, FERRITIN, TIBC, IRON , RETICCTPCT in the last 72 hours. Urine analysis:    Component Value Date/Time   COLORURINE YELLOW (A) 05/29/2024 0942   APPEARANCEUR CLEAR (A) 05/29/2024 0942   APPEARANCEUR Clear 05/25/2019 1554   LABSPEC 1.013 05/29/2024  0942   LABSPEC 1.012 03/07/2015 2143   PHURINE 7.0 05/29/2024 0942   GLUCOSEU >=500 (A) 05/29/2024 0942   GLUCOSEU Negative 03/07/2015 2143   HGBUR SMALL (A) 05/29/2024 0942   BILIRUBINUR NEGATIVE 05/29/2024 0942   BILIRUBINUR Negative 05/25/2019 1554   BILIRUBINUR Negative 03/07/2015 2143   KETONESUR 20 (A) 05/29/2024 0942   PROTEINUR NEGATIVE 05/29/2024 0942   UROBILINOGEN 0.2 03/26/2017 0907   NITRITE NEGATIVE 05/29/2024 0942   LEUKOCYTESUR NEGATIVE 05/29/2024 0942   LEUKOCYTESUR Negative 03/07/2015 2143   Sepsis Labs: @LABRCNTIP (procalcitonin:4,lacticidven:4) )No results found for this or any previous visit (from the past 240 hours).   Radiological Exams on Admission:   Assessment/Plan Principal Problem:   Syncope Active Problems:   CAD (coronary artery disease)   Chest pain   Chronic combined systolic (congestive)  and diastolic (congestive) heart failure (HCC)   HLD (hyperlipidemia)   COPD (chronic obstructive pulmonary disease) (HCC)   HTN (hypertension)   Atrial fibrillation, chronic (HCC)   Anxiety and depression   Bipolar disorder (HCC)   Overweight (BMI 25.0-29.9)   Assessment and Plan:  Syncope: Etiology is not clear.  CT head negative for acute intracranial abnormalities.  No focal neurodeficit on physical examination, low suspicions for stroke.  Patient has very significant cardiac issues, suspecting possible cardiac etiology.  He also reports pleuritic chest pain, will need to rule out PE though patient is on Eliquis  for A-fib.  - Place in tele bed for for obs - Orthostatic vital signs  - 2d echo - Frequent neuro checks  - fall precaution - Follow-up CTA to rule out PE - 2d echo - IVF: NS 50 cc/h - PT/OT eval and treat  CAD (coronary artery disease) and Chest pain: Patient has pleuritic chest pain.  Initial troponin 12. -Follow-up CT to rule out PE as above - Trend troponin - Patient is on Eliquis , will not start aspirin . - As needed Dilaudid , Percocet, Tylenol  for pain - Coreg , Imdur , Lipitor  - As needed nitroglycerin   Chronic combined systolic (congestive) and diastolic (congestive) heart failure (HCC): 2D echo on 08/08/2023 showed EF of 30-35% with grade 2 diastolic dysfunction.  Patient does not have leg edema, no oxygen  desaturation.  No pulm edema on chest x-ray.  CHF seem to be compensated. - Hold Lasix  and spironolactone  due to syncope - Check BNP  HLD (hyperlipidemia) -Lipitor   COPD (chronic obstructive pulmonary disease) (HCC): Stable -Bronchodilators and as needed Mucinex   HTN (hypertension) -IV hydralazine  as needed - Amlodipine , Imdur , Coreg   Atrial fibrillation, chronic (HCC): Heart rate 80-90s. -Continue Eliquis  and Coreg   Anxiety and depression -Continue home medications  Bipolar disorder (HCC) -Continue lithium   Overweight (BMI 25.0-29.9): Body  weight 79.5 kg, BMI 27.45 - Encourage losing weight - Exercise and healthy diet       DVT ppx: on Eliquis   Code Status: DNR per pt  Family Communication:     not done, no family member is at bed side.    Disposition Plan:  Anticipate discharge back to previous environment  Consults called:  none  Admission status and Level of care: Telemetry Medical:    for obs      Dispo: The patient is from: Home              Anticipated d/c is to: Home              Anticipated d/c date is: 1 day              Patient currently is  not medically stable to d/c.    Severity of Illness:  The appropriate patient status for this patient is OBSERVATION. Observation status is judged to be reasonable and necessary in order to provide the required intensity of service to ensure the patient's safety. The patient's presenting symptoms, physical exam findings, and initial radiographic and laboratory data in the context of their medical condition is felt to place them at decreased risk for further clinical deterioration. Furthermore, it is anticipated that the patient will be medically stable for discharge from the hospital within 2 midnights of admission.        Date of Service 06/10/2024    Caleb Exon [facility]   If 7PM-7AM, please contact night-coverage [email or web address] 06/10/2024, 1:32 AM

## 2024-06-10 ENCOUNTER — Observation Stay

## 2024-06-10 ENCOUNTER — Observation Stay
Admit: 2024-06-10 | Discharge: 2024-06-10 | Disposition: A | Discharge status: Home / Self Care | Attending: Internal Medicine | Admitting: Internal Medicine

## 2024-06-10 DIAGNOSIS — F32A Depression, unspecified: Secondary | ICD-10-CM | POA: Diagnosis present

## 2024-06-10 DIAGNOSIS — J449 Chronic obstructive pulmonary disease, unspecified: Secondary | ICD-10-CM | POA: Diagnosis present

## 2024-06-10 DIAGNOSIS — F319 Bipolar disorder, unspecified: Secondary | ICD-10-CM | POA: Diagnosis present

## 2024-06-10 DIAGNOSIS — I25112 Atherosclerotic heart disease of native coronary artery with refractory angina pectoris: Secondary | ICD-10-CM | POA: Diagnosis not present

## 2024-06-10 DIAGNOSIS — R55 Syncope and collapse: Secondary | ICD-10-CM | POA: Diagnosis not present

## 2024-06-10 LAB — BASIC METABOLIC PANEL WITH GFR
Anion gap: 6 (ref 5–15)
BUN: 26 mg/dL — ABNORMAL HIGH (ref 8–23)
CO2: 29 mmol/L (ref 22–32)
Calcium: 9.4 mg/dL (ref 8.9–10.3)
Chloride: 100 mmol/L (ref 98–111)
Creatinine, Ser: 1.15 mg/dL (ref 0.61–1.24)
GFR, Estimated: >60 mL/min (ref >60)
Glucose, Bld: 103 mg/dL — ABNORMAL HIGH (ref 70–99)
Potassium: 4 mmol/L (ref 3.5–5.1)
Sodium: 135 mmol/L (ref 135–145)

## 2024-06-10 LAB — CBC
HCT: 50.8 % (ref 39.0–52.0)
Hemoglobin: 16.5 g/dL (ref 13.0–17.0)
MCH: 28.8 pg (ref 26.0–34.0)
MCHC: 32.5 g/dL (ref 30.0–36.0)
MCV: 88.8 fL (ref 80.0–100.0)
Platelets: 188 K/uL (ref 150–400)
RBC: 5.72 MIL/uL (ref 4.22–5.81)
RDW: 18.8 % — ABNORMAL HIGH (ref 11.5–15.5)
WBC: 7.3 K/uL (ref 4.0–10.5)
nRBC: 0 % (ref 0.0–0.2)

## 2024-06-10 LAB — ECHOCARDIOGRAM COMPLETE
AR max vel: 2.33 cm2
AV Peak grad: 6.2 mmHg
Ao pk vel: 1.24 m/s
Area-P 1/2: 4.29 cm2
Height: 67 in
P 1/2 time: 747 ms
S' Lateral: 2.2 cm
Single Plane A4C EF: 50.1 %
Weight: 2850.11 [oz_av]

## 2024-06-10 LAB — BRAIN NATRIURETIC PEPTIDE: B Natriuretic Peptide: 90.2 pg/mL (ref 0.0–100.0)

## 2024-06-10 LAB — TROPONIN I (HIGH SENSITIVITY)
Troponin I (High Sensitivity): 12 ng/L (ref <18)
Troponin I (High Sensitivity): 11 ng/L (ref <18)
Troponin I (High Sensitivity): 11 ng/L (ref <18)

## 2024-06-10 LAB — GLUCOSE, CAPILLARY
Glucose-Capillary: 91 mg/dL (ref 70–99)
Glucose-Capillary: 123 mg/dL — ABNORMAL HIGH (ref 70–99)
Glucose-Capillary: 166 mg/dL — ABNORMAL HIGH (ref 70–99)
Glucose-Capillary: 143 mg/dL — ABNORMAL HIGH (ref 70–99)

## 2024-06-10 MED ORDER — ATORVASTATIN CALCIUM 20 MG PO TABS
80.0000 mg | ORAL_TABLET | Freq: Every day | ORAL | Status: DC
  Administered 2024-06-10 – 2024-06-12 (×3): 80 mg via ORAL
  Filled 2024-06-10 (×3): qty 4

## 2024-06-10 MED ORDER — BUDESON-GLYCOPYRROL-FORMOTEROL 160-9-4.8 MCG/ACT IN AERO
2.0000 | INHALATION_SPRAY | Freq: Two times a day (BID) | RESPIRATORY_TRACT | Status: DC
  Administered 2024-06-12 – 2024-06-13 (×3): 2 via RESPIRATORY_TRACT
  Filled 2024-06-10 (×2): qty 5.9

## 2024-06-10 MED ORDER — MORPHINE SULFATE (PF) 2 MG/ML IV SOLN: 2.0000 mg | Freq: Once | INTRAVENOUS | Status: DC

## 2024-06-10 MED ORDER — MORPHINE SULFATE (PF) 2 MG/ML IV SOLN
2.0000 mg | INTRAVENOUS | Status: DC | PRN
  Administered 2024-06-10 – 2024-06-11 (×4): 2 mg via INTRAVENOUS
  Filled 2024-06-10 (×4): qty 1

## 2024-06-10 MED ORDER — ISOSORBIDE MONONITRATE ER 30 MG PO TB24
120.0000 mg | ORAL_TABLET | Freq: Every day | ORAL | Status: DC
  Administered 2024-06-10: 120 mg via ORAL
  Filled 2024-06-10: qty 4

## 2024-06-10 MED ORDER — ORAL CARE MOUTH RINSE: 15.0000 mL | OROMUCOSAL | Status: DC | PRN

## 2024-06-10 MED ORDER — AMLODIPINE BESYLATE 10 MG PO TABS
10.0000 mg | ORAL_TABLET | Freq: Every day | ORAL | Status: DC
Start: 2024-06-10 — End: 2024-06-10
  Administered 2024-06-10: 10 mg via ORAL
  Filled 2024-06-10: qty 1

## 2024-06-10 MED ORDER — NITROGLYCERIN 0.4 MG SL SUBL
0.4000 mg | SUBLINGUAL_TABLET | SUBLINGUAL | Status: DC | PRN
  Administered 2024-06-13: 0.4 mg via SUBLINGUAL
  Filled 2024-06-10: qty 1

## 2024-06-10 MED ORDER — PANTOPRAZOLE SODIUM 40 MG PO TBEC
40.0000 mg | DELAYED_RELEASE_TABLET | Freq: Two times a day (BID) | ORAL | Status: DC
Start: 2024-06-10 — End: 2024-06-13
  Administered 2024-06-10 – 2024-06-13 (×7): 40 mg via ORAL
  Filled 2024-06-10 (×7): qty 1

## 2024-06-10 MED ORDER — ALPRAZOLAM 1 MG PO TABS
1.0000 mg | ORAL_TABLET | Freq: Three times a day (TID) | ORAL | Status: DC | PRN
  Administered 2024-06-10 – 2024-06-12 (×3): 1 mg via ORAL
  Filled 2024-06-10 (×3): qty 1

## 2024-06-10 MED ORDER — SENNOSIDES-DOCUSATE SODIUM 8.6-50 MG PO TABS
1.0000 | ORAL_TABLET | Freq: Every day | ORAL | Status: DC | PRN
  Administered 2024-06-12 – 2024-06-13 (×2): 1 via ORAL
  Filled 2024-06-10 (×2): qty 1

## 2024-06-10 MED ORDER — ALUM & MAG HYDROXIDE-SIMETH 200-200-20 MG/5ML PO SUSP: 30.0000 mL | Freq: Three times a day (TID) | ORAL | Status: DC | PRN

## 2024-06-10 MED ORDER — PERFLUTREN LIPID MICROSPHERE
1.0000 mL | INTRAVENOUS | Status: AC | PRN
  Administered 2024-06-10: 5 mL via INTRAVENOUS

## 2024-06-10 MED ORDER — VORTIOXETINE HBR 5 MG PO TABS
10.0000 mg | ORAL_TABLET | Freq: Every day | ORAL | Status: DC
  Administered 2024-06-10 – 2024-06-13 (×4): 10 mg via ORAL
  Filled 2024-06-10 (×4): qty 2

## 2024-06-10 MED ORDER — OMEGA-3-ACID ETHYL ESTERS 1 G PO CAPS
1.0000 g | ORAL_CAPSULE | Freq: Every day | ORAL | Status: DC
  Administered 2024-06-10 – 2024-06-13 (×4): 1 g via ORAL
  Filled 2024-06-10 (×4): qty 1

## 2024-06-10 MED ORDER — OXYCODONE HCL 5 MG PO TABS
10.0000 mg | ORAL_TABLET | ORAL | Status: DC | PRN
  Administered 2024-06-10 – 2024-06-13 (×8): 10 mg via ORAL
  Filled 2024-06-10 (×8): qty 2

## 2024-06-10 MED ORDER — POLYSACCHARIDE IRON COMPLEX 150 MG PO CAPS
150.0000 mg | ORAL_CAPSULE | ORAL | Status: DC
  Administered 2024-06-12: 150 mg via ORAL
  Filled 2024-06-10: qty 1

## 2024-06-10 MED ORDER — ALLOPURINOL 100 MG PO TABS
300.0000 mg | ORAL_TABLET | Freq: Two times a day (BID) | ORAL | Status: DC
  Administered 2024-06-10 – 2024-06-13 (×7): 300 mg via ORAL
  Filled 2024-06-10 (×7): qty 3

## 2024-06-10 MED ORDER — RANOLAZINE ER 500 MG PO TB12
1000.0000 mg | ORAL_TABLET | Freq: Two times a day (BID) | ORAL | Status: DC
  Administered 2024-06-10 – 2024-06-13 (×6): 1000 mg via ORAL
  Filled 2024-06-10 (×6): qty 2

## 2024-06-10 MED ORDER — VITAMIN B-12 1000 MCG PO TABS
1000.0000 ug | ORAL_TABLET | Freq: Every day | ORAL | Status: DC
  Administered 2024-06-10 – 2024-06-13 (×4): 1000 ug via ORAL
  Filled 2024-06-10 (×4): qty 1

## 2024-06-10 MED ORDER — ALBUTEROL SULFATE (2.5 MG/3ML) 0.083% IN NEBU: 2.5000 mg | INHALATION_SOLUTION | RESPIRATORY_TRACT | Status: DC | PRN

## 2024-06-10 MED ORDER — SACUBITRIL-VALSARTAN 24-26 MG PO TABS
1.0000 | ORAL_TABLET | Freq: Two times a day (BID) | ORAL | Status: DC
  Administered 2024-06-10 (×2): 1 via ORAL
  Filled 2024-06-10 (×3): qty 1

## 2024-06-10 MED ORDER — SODIUM CHLORIDE 0.9 % IV SOLN: INTRAVENOUS | Status: DC

## 2024-06-10 MED ORDER — HYDROMORPHONE HCL 1 MG/ML IJ SOLN
0.5000 mg | INTRAMUSCULAR | Status: DC | PRN
  Administered 2024-06-10 (×2): 0.5 mg via INTRAVENOUS
  Filled 2024-06-10 (×2): qty 0.5

## 2024-06-10 MED ORDER — METHOCARBAMOL 500 MG PO TABS
500.0000 mg | ORAL_TABLET | Freq: Three times a day (TID) | ORAL | Status: DC | PRN
  Administered 2024-06-10 – 2024-06-11 (×3): 500 mg via ORAL
  Filled 2024-06-10 (×4): qty 1

## 2024-06-10 MED ORDER — TRAZODONE HCL 50 MG PO TABS: 150.0000 mg | ORAL_TABLET | Freq: Every evening | ORAL | Status: DC | PRN

## 2024-06-10 MED ORDER — FAMOTIDINE 20 MG PO TABS
20.0000 mg | ORAL_TABLET | Freq: Two times a day (BID) | ORAL | Status: DC
  Administered 2024-06-10 – 2024-06-13 (×6): 20 mg via ORAL
  Filled 2024-06-10 (×6): qty 1

## 2024-06-10 MED ORDER — IOHEXOL 350 MG/ML SOLN
100.0000 mL | Freq: Once | INTRAVENOUS | Status: AC | PRN
  Administered 2024-06-10: 100 mL via INTRAVENOUS

## 2024-06-10 MED ORDER — CARVEDILOL 3.125 MG PO TABS
3.1250 mg | ORAL_TABLET | Freq: Two times a day (BID) | ORAL | Status: DC
  Administered 2024-06-10 – 2024-06-12 (×5): 3.125 mg via ORAL
  Filled 2024-06-10 (×5): qty 1

## 2024-06-10 MED ORDER — APIXABAN 5 MG PO TABS
5.0000 mg | ORAL_TABLET | Freq: Two times a day (BID) | ORAL | Status: DC
  Administered 2024-06-10 – 2024-06-13 (×7): 5 mg via ORAL
  Filled 2024-06-10 (×7): qty 1

## 2024-06-10 MED ORDER — LITHIUM CARBONATE 150 MG PO CAPS
150.0000 mg | ORAL_CAPSULE | Freq: Two times a day (BID) | ORAL | Status: DC
  Administered 2024-06-10 – 2024-06-13 (×6): 150 mg via ORAL
  Filled 2024-06-10 (×6): qty 1

## 2024-06-10 NOTE — Evaluation (Signed)
 Occupational Therapy Evaluation Patient Details Name: Lawrence [name] MRN: [id number] DOB: 1956-08-19 Today's Date: 06/10/2024   History of Present Illness   68 y.o. male with medical history significant of f CAD (s/p of stent x 4 and CBAG 2016), sCHF with EF 30-35%, a fib on Eliquis , PSVT,  HTN, HLD, DM, COPD on 2L O2, OSA not using CPAP, dCHF, anxiety, celiac disease, pancreatitis, who presents with syncope and chest pain     Clinical Impressions Patient presenting with decreased Ind in self care,balance, functional mobility/transfers, endurance, and safety awareness. Patient reports being Mod I at baseline with use of rollator for mobility. He does live at home with grandson (caregiver). He is available to assist with anything needed. Pt does endorse dizziness when initially sitting up on EOB but endorses it goes away quickly. Patient ambulates with min guard progressing to close supervision with use of RW. Pt ambulates to sink for hygiene and then returns back to sit in recliner chair at end of session. Call bell and all needed items within reach.  Patient will benefit from acute OT to increase overall independence in the areas of ADLs, functional mobility, and safety awareness in order to safely discharge.     If plan is discharge home, recommend the following:   A little help with walking and/or transfers;A little help with bathing/dressing/bathroom;Help with stairs or ramp for entrance;Assistance with cooking/housework;Assist for transportation     Functional Status Assessment   Patient has had a recent decline in their functional status and demonstrates the ability to make significant improvements in function in a reasonable and predictable amount of time.     Equipment Recommendations   None recommended by OT      Precautions/Restrictions   Precautions Precautions: Fall     Mobility Bed Mobility Overal bed mobility: Modified Independent             General  bed mobility comments: increased time and effort but no physical assistance needed    Transfers Overall transfer level: Needs assistance Equipment used: Rolling walker (2 wheels) Transfers: Sit to/from Stand, Bed to chair/wheelchair/BSC Sit to Stand: Supervision     Step pivot transfers: Contact guard assist            Balance Overall balance assessment: Needs assistance Sitting-balance support: Feet supported Sitting balance-Leahy Scale: Good     Standing balance support: Bilateral upper extremity supported, Reliant on assistive device for balance Standing balance-Leahy Scale: Fair                             ADL either performed or assessed with clinical judgement   ADL Overall ADL's : Needs assistance/impaired                         Toilet Transfer: Contact guard assist           Functional mobility during ADLs: Supervision/safety;Contact guard assist;Rolling walker (2 wheels)       Vision Baseline Vision/History: 1 Wears glasses Patient Visual Report: No change from baseline              Pertinent Vitals/Pain Pain Assessment Pain Assessment: No/denies pain     Extremity/Trunk Assessment Upper Extremity Assessment Upper Extremity Assessment: Generalized weakness   Lower Extremity Assessment Lower Extremity Assessment: Generalized weakness       Communication Communication Communication: No apparent difficulties   Cognition Arousal: Alert Behavior During Therapy: [facility] for  tasks assessed/performed Cognition: No apparent impairments                               Following commands: Intact       Cueing  General Comments   Cueing Techniques: Verbal cues              Home Living Family/patient expects to be discharged to:: Private residence Living Arrangements: Other relatives;Other (Comment) (lives with grandson) Available Help at Discharge: Family;Available 24 hours/day Type of Home:  Apartment Home Access: Level entry;Ramped entrance     Home Layout: One level     Bathroom Shower/Tub: Producer, television/film/video: Standard     Home Equipment: Shower seat - built in;Rollator (4 wheels);BSC/3in1;Hospital bed;Grab bars - toilet;Grab bars - tub/shower          Prior Functioning/Environment Prior Level of Function : Independent/Modified Independent;History of Falls (last six months)             Mobility Comments: Modified independent ambulating with rollator.  2 L O2 at night (PRN during the day) ADLs Comments: Pt endorses being Ind with self care tasks but grandson is available to assist. Lawrence [name] performs most of IADLs.    OT Problem List: Decreased strength;Impaired balance (sitting and/or standing);Pain;Decreased safety awareness;Decreased activity tolerance   OT Treatment/Interventions: Self-care/ADL training;Therapeutic exercise;Patient/family education;Balance training;Energy conservation;Therapeutic activities      OT Goals(Current goals can be found in the care plan section)   Acute Rehab OT Goals Patient Stated Goal: to return home OT Goal Formulation: With patient Time For Goal Achievement: 06/24/24 Potential to Achieve Goals: Fair ADL Goals Pt Will Perform Grooming: with modified independence;standing Pt Will Perform Lower Body Dressing: with modified independence;sit to/from stand Pt Will Transfer to Toilet: with modified independence;ambulating Pt Will Perform Toileting - Clothing Manipulation and hygiene: with modified independence;sit to/from stand   OT Frequency:  Min 2X/week       AM-PAC OT 6 Clicks Daily Activity     Outcome Measure Help from another person eating meals?: None Help from another person taking care of personal grooming?: None Help from another person toileting, which includes using toliet, bedpan, or urinal?: A Little Help from another person bathing (including washing, rinsing, drying)?: A Little Help  from another person to put on and taking off regular upper body clothing?: None Help from another person to put on and taking off regular lower body clothing?: A Little 6 Click Score: 21   End of Session Equipment Utilized During Treatment: Rolling walker (2 wheels) Nurse Communication: Mobility status  Activity Tolerance: Patient tolerated treatment well Patient left: in chair;with call bell/phone within reach;with chair alarm set  OT Visit Diagnosis: Unsteadiness on feet (R26.81);Muscle weakness (generalized) (M62.81)                Time: 9062-9046 OT Time Calculation (min): 16 min Charges:  OT General Charges $OT Visit: 1 Visit OT Evaluation $OT Eval Low Complexity: 1 Low OT Treatments $Self Care/Home Management : 8-22 mins  Izetta Claude, MS, OTR/L , CBIS ascom [phone]  06/10/24, 11:06 AM

## 2024-06-10 NOTE — TOC Initial Note (Signed)
 Transition of Care [facility]) - Initial/Assessment Note    Patient Details  Name: Lawrence [name] MRN: [id number] Date of Birth: 1956-04-13  Transition of Care [facility]) CM/SW Contact:    Edsel DELENA Fischer, LCSW Phone Number: 06/10/2024, 4:15 PM  Clinical Narrative:                      SW met with pt at bedside: SW discussed TOC needs.  Pt stated that he would like assistance with food.  Pt stated that he receives food card from insurance in the amount of $225 and meals on wheel. Pt does not receive food stamps.   SW expressed to pt that sw will locate resources and add to discharge summary.  Pt stated that he wants to use [employer] out of [place], [place] for [facility].  Pt stated that he has CPAP machine and has oxygen  at home. Pt would like for any DME equipment to be delivered to his home.  Pt stated that grandson takes care of busy stuff and provides support to pt.    Patient Goals and CMS Choice            Expected Discharge Plan and Services                                              Prior Living Arrangements/Services                       Activities of Daily Living   ADL Screening (condition at time of admission) Independently performs ADLs?: Yes (appropriate for developmental age) Is the patient deaf or have difficulty hearing?: No Does the patient have difficulty seeing, even when wearing glasses/contacts?: No Does the patient have difficulty concentrating, remembering, or making decisions?: No  Permission Sought/Granted                  Emotional Assessment              Admission diagnosis:  Syncope [R55] Syncope, unspecified syncope type [R55] Pneumonia of right lower lobe due to infectious organism [J18.9] Patient Active Problem List   Diagnosis Date Noted   COPD (chronic obstructive pulmonary disease) (HCC) 06/10/2024   Anxiety and depression 06/10/2024   Bipolar disorder (HCC) 06/10/2024   Mild cognitive impairment 06/09/2024    RLL pneumonia 05/30/2024   Paroxysmal atrial fibrillation with RVR (HCC) 05/30/2024   Afib (HCC) 05/29/2024   Atrial fibrillation, chronic (HCC) 10/19/2023   Hypotension 10/18/2023   COPD with acute exacerbation (HCC) 10/18/2023   Generalized weakness 10/18/2023   Left hip pain 10/13/2023   Malfunction of continuous or biphasic positive airway pressure machine 10/13/2023   Blood pressure alteration 10/13/2023   Overweight (BMI 25.0-29.9) 10/02/2023   Chest pain 10/01/2023   Near syncope 09/29/2023   Dyslipidemia 09/29/2023   Anxiety 09/29/2023   Chronic atrial fibrillation with RVR (HCC) 09/29/2023   Coronary artery disease 09/29/2023   Nonspecific chest pain 08/05/2023   NSTEMI (non-ST elevated myocardial infarction) (HCC) 08/04/2023   Diabetes mellitus without complication (HCC) 08/04/2023   HTN (hypertension) 08/04/2023   CAD (coronary artery disease) 08/04/2023   FTT (failure to thrive) in adult 07/21/2023   Hospital discharge follow-up 07/21/2023   Acute bronchitis with COPD (HCC) 07/21/2023   Diabetes mellitus (HCC) 07/21/2023   AKI (acute kidney injury) (HCC)  07/15/2023   PAD (peripheral artery disease) (HCC) 07/11/2023   Type 2 diabetes mellitus without complications (HCC) 05/17/2023   Essential hypertension 05/17/2023   Gout 02/22/2023   Chronic combined systolic (congestive) and diastolic (congestive) heart failure (HCC)    Hypertensive urgency 04/27/2021   Atherosclerosis of aorta (HCC) 12/16/2020   Syncope 12/11/2020   Polycythemia 10/05/2020   Chronic respiratory failure with hypoxia (HCC) 05/29/2019   Acute on chronic congestive heart failure (HCC) 04/18/2018   OSA (obstructive sleep apnea) 12/10/2015   Depression with anxiety 11/07/2015   BPH (benign prostatic hyperplasia) 08/01/2015   Achalasia 07/24/2014   HLD (hyperlipidemia) 04/09/2013   PCP:  [name], MD Pharmacy:   [facility] - [place], [place] - [address] [place] [address] Phone: [phone] Fax: [phone]  [facility] - [place], [place] - [address] [place] [address] Phone: [phone] Fax: [phone]  [id number] - [place], [place] - [address] AT [facility] OF [address] [place] [address] Phone: [phone] Fax: [phone]  [facility] [address] [place] [address] Phone: [phone] Fax: [phone]     Social Drivers of Health (SDOH) Social History: SDOH Screenings   Food Insecurity: No Food Insecurity (06/10/2024)  Housing: Low Risk  (06/10/2024)  Transportation Needs: No Transportation Needs (06/10/2024)  Utilities: Not At Risk (06/10/2024)  Alcohol Screen: Low Risk  (03/14/2024)  Depression (PHQ2-9): High Risk (06/09/2024)  Financial Resource Strain: Low Risk  (03/28/2024)  Physical Activity: Insufficiently Active (03/14/2024)  Social Connections: Socially Isolated (06/10/2024)  Stress: No Stress Concern Present (03/14/2024)  Recent Concern: Stress - Stress Concern Present (01/13/2024)  Tobacco Use: Medium Risk (06/09/2024)  Health Literacy: Adequate Health Literacy (03/14/2024)   SDOH Interventions:     Readmission Risk Interventions    09/30/2023   10:28 AM 08/05/2023   10:11 AM 07/13/2023   10:52 AM  Readmission Risk Prevention Plan  Transportation Screening Complete Complete Complete  Medication Review (RN Care Manager) Complete Complete Complete  PCP or Specialist appointment within 3-5 days of discharge Complete Complete Complete  SW Recovery Care/Counseling Consult Complete Complete Complete  Palliative Care Screening Not Applicable Not Applicable Not Applicable  Skilled Nursing Facility Not Applicable Not Applicable Not Applicable

## 2024-06-10 NOTE — Progress Notes (Signed)
 PROGRESS NOTE    [name]   FMW:[id number] DOB: 1956/10/19  DOA: 06/09/2024 Date of Service: 06/10/24 which is hospital day 0  PCP: Gasper Lawrence BRAVO, MD    Hospital course / significant events:   HPI: Lawrence [name] is a 67 y.o. male with medical history significant of f CAD (s/p of stent x 4 and CABG 2016), HFrEF with EF 30-35%, a fib on Eliquis , PSVT,  HTN, HLD, DM, COPD on 2L O2, OSA not using CPAP, dCHF, anxiety, celiac disease, pancreatitis, who presents with syncope and chest pain -  Patient states that he started feeling dizzy and lightheaded 07/25 around 8:00 PM, then passed out briefly. He reports chest pain, which is located in the front chest, constant, sharp, pleuritic, 9 out of 10 in severity, nonradiating. Associated with mild SOB and mild dry cough.   recently hospitalized from 7/14 - 7/17 due to pneumonia and COPD exacerbation.   07/25: to ED. Troponin 12. Imaging neg. Admitted to hospitalist.  07/26: (+)orthostatics, Echo shows improved EF from prior, still having dizziness and unsteadiness, will hold entresto  (as well as previously held meds) and see how he is doing tomorrow    Consultants:  none  Procedures/Surgeries: none      ASSESSMENT & PLAN:   Syncope Etiology is not clear but suspect orthostatic/vasovagal even (though orthostatics here initially negative, were positive today), possible Afib RVR then self-converted. Suspect he has concussion also complicating dizziness with position changes   CT head negative for acute intracranial abnormalities.   No focal neurodeficit on physical examination, low suspicions for stroke.   CHF appears compensated PE ruled out  No infection  Troponins neg/flat no ACS Orthostatics positive  Echo is showing improved function compared to alst available echo 07/2023 - EF 45-50% (improved from 30-35% w/ global hypokinesis), grade 1 diastolic dysfunction  fall precaution IVF: NS 50 cc/h PT/OT eval and treat    CAD (coronary artery disease)  Chest pain: ACS ruled out Patient is on Eliquis , will not start aspirin . Percocet, Tylenol  for pain Coreg , Imdur , Lipitor , ranolazine  Home meds - metoprolol  and carvedilol  are on his list wil continue low dose carvedilol  only given orthostatics  As needed nitroglycerin    Paroxysmal Afib Telemetry  Eliquis  Beta blocker - Carvedilol  (metoprolol  also on list, will hold this d/t hypotension / syncope)   Chronic combined systolic (congestive) and diastolic (congestive) heart failure not in acute exacerbation Hold Lasix  and spironolactone  due to syncope Continue entresto  for now, he in on lowest dose    HLD (hyperlipidemia) Lipitor    COPD (chronic obstructive pulmonary disease) (HCC): Stable Breztri  and as needed Mucinex    HTN (hypertension) Holding amlodipine  d/t (+)orthostatics  Imdur , Coreg   Diabetes Type 2 SSI here    Anxiety and depression Trintellix , trazodone  Xanax  prn    Bipolar disorder (HCC) Continue lithium   GERD PPI, H2B    overweight based on BMI: Body mass index is 27.9 kg/m.SABRA Significantly low or high BMI is associated with higher medical risk.  Underweight - under 18  overweight - 25 to 29 obese - 30 or more Class 1 obesity: BMI of 30.0 to 34 Class 2 obesity: BMI of 35.0 to 39 Class 3 obesity: BMI of 40.0 to 49 Super Morbid Obesity: BMI 50-59 Super-super Morbid Obesity: BMI 60+ Healthy nutrition and physical activity advised as adjunct to other disease management and risk reduction treatments    DVT prophylaxis: eliquis  IV fluids: no continuous IV fluids  Nutrition: regular diet (can liberalize salt  intake some to matc pt's home diet and possibly help w/ low BP but will monitor fluid status closely)  Central lines / other devices: none  Code Status: DNR ACP documentation reviewed:  none on file in [facility]  [facility] needs: home health  Medical barriers to dispo: ambulatory dysfunction / dizziness, he lives alone.  Expected medical readiness for discharge tomorrow.              Subjective / Brief ROS:  Patient reports request for pain medications Denies SOB Reports chest pain is sore when he gets up to move around Reports still feeling dizzy / lightheaded when walking.  Denies new weakness.  Tolerating diet .  Reports no concerns w/ urination/defecation.   Family Communication: none at this time     Objective Findings:  Vitals:   06/10/24 0459 06/10/24 0836 06/10/24 1133 06/10/24 1505  BP:  120/84 98/74 95/76   Pulse:  92 86 79  Resp:  17 16 20   Temp:  97.7 F (36.5 C) 97.9 F (36.6 C) 97.6 F (36.4 C)  TempSrc:  Oral    SpO2:  97% 92% 95%  Weight: 80.8 kg     Height:        Intake/Output Summary (Last 24 hours) at 06/10/2024 1714 Last data filed at 06/10/2024 1616 Gross per 24 hour  Intake 1019.19 ml  Output 1200 ml  Net -180.81 ml   Filed Weights   06/09/24 1826 06/10/24 0013 06/10/24 0459  Weight: 80 kg 79.5 kg 80.8 kg    Examination:  Physical Exam Constitutional:      General: He is not in acute distress. Cardiovascular:     Rate and Rhythm: Normal rate and regular rhythm.  Pulmonary:     Effort: Pulmonary effort is normal.     Breath sounds: Normal breath sounds. No rales.  Musculoskeletal:     Right lower leg: No edema.     Left lower leg: No edema.  Skin:    General: Skin is warm and dry.  Neurological:     Mental Status: He is alert and oriented to person, place, and time. Mental status is at baseline.  Psychiatric:        Mood and Affect: Mood normal.        Behavior: Behavior normal.          Scheduled Medications:   allopurinol   300 mg Oral BID   apixaban   5 mg Oral BID   atorvastatin   80 mg Oral QHS   budesonide -glycopyrrolate -formoterol   2 puff Inhalation BID   carvedilol   3.125 mg Oral BID WC   cyanocobalamin   1,000 mcg Oral Daily   famotidine   20 mg Oral BID   insulin  aspart  0-5 Units Subcutaneous QHS   insulin  aspart  0-9  Units Subcutaneous TID WC   [START ON 06/12/2024] iron  polysaccharides  150 mg Oral Once per day on Monday Wednesday Friday   isosorbide  mononitrate  120 mg Oral Daily   lithium  carbonate  150 mg Oral BID    morphine  injection  2 mg Intravenous Once   omega-3 acid ethyl esters  1 g Oral Daily   pantoprazole   40 mg Oral BID   ranolazine   1,000 mg Oral BID   sacubitril -valsartan   1 tablet Oral BID   vortioxetine  HBr  10 mg Oral Daily    Continuous Infusions:    PRN Medications:  acetaminophen , albuterol , ALPRAZolam , alum & mag hydroxide-simeth, dextromethorphan -guaiFENesin , methocarbamol , nitroGLYCERIN , ondansetron  (ZOFRAN ) IV, mouth rinse, oxyCODONE -acetaminophen , senna-docusate, traZODone   Antimicrobials from admission:  Anti-infectives (From admission, onward)    None           Data Reviewed:  I have personally reviewed the following...  CBC: Recent Labs  Lab 06/09/24 2025 06/10/24 0319  WBC 8.6 7.3  NEUTROABS 5.7  --   HGB 17.1* 16.5  HCT 52.4* 50.8  MCV 87.3 88.8  PLT 180 188   Basic Metabolic Panel: Recent Labs  Lab 06/09/24 2215 06/10/24 0319  NA 139 135  K 4.6 4.0  CL 101 100  CO2 28 29  GLUCOSE 113* 103*  BUN 28* 26*  CREATININE 1.12 1.15  CALCIUM  9.7 9.4   GFR: Estimated Creatinine Clearance: 60.9 mL/min (by C-G formula based on SCr of 1.15 mg/dL). Liver Function Tests: Recent Labs  Lab 06/09/24 2215  AST 14*  ALT 18  ALKPHOS 48  BILITOT 1.4*  PROT 6.1*  ALBUMIN 3.4*   No results for input(s): LIPASE, AMYLASE in the last 168 hours. No results for input(s): AMMONIA in the last 168 hours. Coagulation Profile: No results for input(s): INR, PROTIME in the last 168 hours. Cardiac Enzymes: No results for input(s): CKTOTAL, CKMB, CKMBINDEX, TROPONINI in the last 168 hours. BNP (last 3 results) No results for input(s): PROBNP in the last 8760 hours. HbA1C: No results for input(s): HGBA1C in the last 72  hours. CBG: Recent Labs  Lab 06/10/24 0832 06/10/24 1214 06/10/24 1616  GLUCAP 91 123* 166*   Lipid Profile: No results for input(s): CHOL, HDL, LDLCALC, TRIG, CHOLHDL, LDLDIRECT in the last 72 hours. Thyroid  Function Tests: No results for input(s): TSH, T4TOTAL, FREET4, T3FREE, THYROIDAB in the last 72 hours. Anemia Panel: No results for input(s): VITAMINB12, FOLATE, FERRITIN, TIBC, IRON , RETICCTPCT in the last 72 hours. Most Recent Urinalysis On File:     Component Value Date/Time   COLORURINE YELLOW (A) 05/29/2024 0942   APPEARANCEUR CLEAR (A) 05/29/2024 0942   APPEARANCEUR Clear 05/25/2019 1554   LABSPEC 1.013 05/29/2024 0942   LABSPEC 1.012 03/07/2015 2143   PHURINE 7.0 05/29/2024 0942   GLUCOSEU >=500 (A) 05/29/2024 0942   GLUCOSEU Negative 03/07/2015 2143   HGBUR SMALL (A) 05/29/2024 0942   BILIRUBINUR NEGATIVE 05/29/2024 0942   BILIRUBINUR Negative 05/25/2019 1554   BILIRUBINUR Negative 03/07/2015 2143   KETONESUR 20 (A) 05/29/2024 0942   PROTEINUR NEGATIVE 05/29/2024 0942   UROBILINOGEN 0.2 03/26/2017 0907   NITRITE NEGATIVE 05/29/2024 0942   LEUKOCYTESUR NEGATIVE 05/29/2024 0942   LEUKOCYTESUR Negative 03/07/2015 2143   Sepsis Labs: @LABRCNTIP (procalcitonin:4,lacticidven:4) Microbiology: No results found for this or any previous visit (from the past 240 hours).    Radiology Studies last 3 days: ECHOCARDIOGRAM COMPLETE Result Date: 06/10/2024    ECHOCARDIOGRAM REPORT   Patient Name:   Lawrence [name] [facility] Date of Exam: 06/10/2024 Medical Rec #:  [id number]       Height:       67.0 in Accession #:    [id number]      Weight:       178.1 lb Date of Birth:  27-Jan-1956       BSA:          1.925 m Patient Age:    68 years        BP:           133/89 mmHg Patient Gender: M               HR:           76 bpm. Exam Location:  [facility] Procedure: 2D Echo, Cardiac Doppler, Color Doppler and Intracardiac            Opacification Agent (Both Spectral  and Color Flow Doppler were            utilized during procedure). Indications:     Syncope R55  History:         Patient has prior history of Echocardiogram examinations, most                  recent 08/08/2023.  Sonographer:     Thedora Louder RDCS, FASE Referring Phys:  [id number] XILIN NIU Diagnosing Phys: Cara JONETTA Lovelace MD  Sonographer Comments: Technically difficult study due to poor echo windows and suboptimal apical window. Image acquisition challenging due to patient body habitus and Image acquisition challenging due to respiratory motion. IMPRESSIONS  1. Left ventricular ejection fraction, by estimation, is 45 to 50%. The left ventricle has mildly decreased function. The left ventricle demonstrates global hypokinesis. There is mild concentric left ventricular hypertrophy. Left ventricular diastolic parameters are consistent with Grade I diastolic dysfunction (impaired relaxation).  2. Right ventricular systolic function is normal. The right ventricular size is normal.  3. The mitral valve is normal in structure. No evidence of mitral valve regurgitation.  4. The aortic valve is normal in structure. Aortic valve regurgitation is not visualized. FINDINGS  Left Ventricle: Left ventricular ejection fraction, by estimation, is 45 to 50%. The left ventricle has mildly decreased function. The left ventricle demonstrates global hypokinesis. Definity  contrast agent was given IV to delineate the left ventricular  endocardial borders. Strain was performed and the global longitudinal strain is indeterminate. The left ventricular internal cavity size was normal in size. There is mild concentric left ventricular hypertrophy. Left ventricular diastolic parameters are  consistent with Grade I diastolic dysfunction (impaired relaxation). Right Ventricle: The right ventricular size is normal. No increase in right ventricular wall thickness. Right ventricular systolic function is normal. Left Atrium: Left atrial size was  normal in size. Right Atrium: Right atrial size was normal in size. Pericardium: There is no evidence of pericardial effusion. Mitral Valve: The mitral valve is normal in structure. No evidence of mitral valve regurgitation. Tricuspid Valve: The tricuspid valve is normal in structure. Tricuspid valve regurgitation is not demonstrated. Aortic Valve: The aortic valve is normal in structure. Aortic valve regurgitation is not visualized. Aortic regurgitation PHT measures 747 msec. Aortic valve peak gradient measures 6.2 mmHg. Pulmonic Valve: The pulmonic valve was normal in structure. Pulmonic valve regurgitation is not visualized. Aorta: The ascending aorta was not well visualized. IAS/Shunts: No atrial level shunt detected by color flow Doppler. Additional Comments: 3D was performed not requiring image post processing on an independent workstation and was indeterminate.  LEFT VENTRICLE PLAX 2D LVIDd:         2.80 cm     Diastology LVIDs:         2.20 cm     LV e' medial:    10.80 cm/s LV PW:         1.40 cm     LV E/e' medial:  6.3 LV IVS:        1.30 cm     LV e' lateral:   13.10 cm/s LVOT diam:     2.00 cm     LV E/e' lateral: 5.2 LV SV:         53 LV SV Index:   28 LVOT Area:     3.14 cm  LV  Volumes (MOD) LV vol d, MOD A4C: 74.4 ml LV vol s, MOD A4C: 37.1 ml LV SV MOD A4C:     74.4 ml RIGHT VENTRICLE RV Basal diam:  2.90 cm RV S prime:     7.62 cm/s LEFT ATRIUM           Index        RIGHT ATRIUM           Index LA diam:      5.10 cm 2.65 cm/m   RA Area:     13.20 cm LA Vol (A2C): 82.8 ml 43.01 ml/m  RA Volume:   28.10 ml  14.60 ml/m LA Vol (A4C): 47.7 ml 24.78 ml/m  AORTIC VALVE                 PULMONIC VALVE AV Area (Vmax): 2.33 cm     PV Vmax:        0.67 m/s AV Vmax:        124.00 cm/s  PV Peak grad:   1.8 mmHg AV Peak Grad:   6.2 mmHg     RVOT Peak grad: 2 mmHg LVOT Vmax:      91.80 cm/s LVOT Vmean:     60.800 cm/s LVOT VTI:       0.169 m AI PHT:         747 msec  AORTA Ao Root diam: 3.70 cm Ao Asc  diam:  3.20 cm MITRAL VALVE MV Area (PHT): 4.29 cm    SHUNTS MV Decel Time: 177 msec    Systemic VTI:  0.17 m MV E velocity: 68.10 cm/s  Systemic Diam: 2.00 cm Cara JONETTA Lovelace MD Electronically signed by Cara JONETTA Lovelace MD Signature Date/Time: 06/10/2024/3:38:28 PM    Final    CT Angio Chest Pulmonary Embolism (PE) W or WO Contrast Result Date: 06/10/2024 CLINICAL DATA:  Syncope/presyncope, cerebrovascular cause suspected EXAM: CT ANGIOGRAPHY CHEST WITH CONTRAST TECHNIQUE: Multidetector CT imaging of the chest was performed using the standard protocol during bolus administration of intravenous contrast. Multiplanar CT image reconstructions and MIPs were obtained to evaluate the vascular anatomy. RADIATION DOSE REDUCTION: This exam was performed according to the departmental dose-optimization program which includes automated exposure control, adjustment of the mA and/or kV according to patient size and/or use of iterative reconstruction technique. CONTRAST:  OMNIPAQUE  IOHEXOL  350 MG/ML SOLN COMPARISON:  05/27/2024 FINDINGS: Cardiovascular: Adequate opacification of the pulmonary arterial tree. No intraluminal filling defect identified to suggest acute pulmonary embolism. Central pulmonary arteries are mildly enlarged in keeping with changes of pulmonary arterial hypertension. Status post coronary artery bypass grafting. Cardiac size is mildly enlarged, stable. No pericardial effusion. Moderate atherosclerotic calcification within the thoracic aorta. No aortic aneurysm. Mediastinum/Nodes: No enlarged mediastinal, hilar, or axillary lymph nodes. Thyroid  gland, trachea, and esophagus demonstrate no significant findings. Small hiatal hernia Lungs/Pleura: Previously noted extensive pulmonary infiltrate throughout the right lung has significantly improved in the interval with mild residual peribronchial and centrilobular nodular infiltrate. Mild emphysema. Stable bronchial wall thickening in keeping with  airway inflammation. Scattered airway impaction noted within right lower lobe. No central obstructing mass. No pneumothorax or pleural effusion. Upper Abdomen: No acute abnormality. Musculoskeletal: No acute bone abnormality. No lytic or blastic bone lesion. Osseous structures are age appropriate. Review of the MIP images confirms the above findings. IMPRESSION: 1. No pulmonary embolism. 2. Mild enlargement of the central pulmonary arteries in keeping with changes of pulmonary arterial hypertension. 3. Mild cardiomegaly. Status post coronary artery bypass  grafting. 4. Improved airspace infiltrate throughout the right lung in keeping a resolving infectious process 5. Emphysema.  Airway inflammation. Aortic Atherosclerosis (ICD10-I70.0) and Emphysema (ICD10-J43.9). Electronically Signed   By: Dorethia Molt M.D.   On: 06/10/2024 02:56   DG Lumbar Spine 2-3 Views Result Date: 06/09/2024 CLINICAL DATA:  Recent fall with low back pain, initial encounter EXAM: LUMBAR SPINE - 3 VIEW COMPARISON:  03/28/2024 FINDINGS: Five lumbar type vertebral bodies are well visualized. Vertebral body height is well maintained. Mild osteophytic changes are seen. Degenerative change with mild anterolisthesis of L4 on L5 is again noted. Aortic calcifications are seen. IMPRESSION: Multilevel degenerative change without acute abnormality. Electronically Signed   By: Oneil Devonshire M.D.   On: 06/09/2024 19:29   DG Chest 2 View Result Date: 06/09/2024 CLINICAL DATA:  Dizziness and chest pain EXAM: CHEST - 2 VIEW COMPARISON:  05/29/2024 FINDINGS: Cardiac shadow is stable. Postsurgical changes are again noted. Aortic calcifications are seen. The lungs are well aerated bilaterally. No focal infiltrate or effusion is seen. No bony abnormality is noted. IMPRESSION: No active cardiopulmonary disease. Electronically Signed   By: Oneil Devonshire M.D.   On: 06/09/2024 19:28   CT Head Wo Contrast Result Date: 06/09/2024 CLINICAL DATA:  Dizziness fall  LOC EXAM: CT HEAD WITHOUT CONTRAST CT CERVICAL SPINE WITHOUT CONTRAST TECHNIQUE: Multidetector CT imaging of the head and cervical spine was performed following the standard protocol without intravenous contrast. Multiplanar CT image reconstructions of the cervical spine were also generated. RADIATION DOSE REDUCTION: This exam was performed according to the departmental dose-optimization program which includes automated exposure control, adjustment of the mA and/or kV according to patient size and/or use of iterative reconstruction technique. COMPARISON:  CT brain and cervical spine 05/27/2024 FINDINGS: CT HEAD FINDINGS Brain: No acute territorial infarction, hemorrhage or intracranial mass. The ventricles are nonenlarged Vascular: No hyperdense vessel or unexpected calcification. Skull: Normal. Negative for fracture or focal lesion. Sinuses/Orbits: No acute finding. Other: None CT CERVICAL SPINE FINDINGS Alignment: Straightening of the cervical spine. No subluxation. Facet alignment is normal Skull base and vertebrae: No fracture. Chronic lucent lesion at C3, likely benign. Soft tissues and spinal canal: No prevertebral fluid or swelling. No visible canal hematoma. Disc levels: No significant canal stenosis or foraminal narrowing. Minimal disc space narrowing C5-C6 and C6-C7 Upper chest: Negative. Other: None IMPRESSION: Negative non contrasted CT appearance of the brain. Straightening of the cervical spine with minimal degenerative change. No acute osseous abnormality. Electronically Signed   By: Luke Bun M.D.   On: 06/09/2024 19:19   CT Cervical Spine Wo Contrast Result Date: 06/09/2024 CLINICAL DATA:  Dizziness fall LOC EXAM: CT HEAD WITHOUT CONTRAST CT CERVICAL SPINE WITHOUT CONTRAST TECHNIQUE: Multidetector CT imaging of the head and cervical spine was performed following the standard protocol without intravenous contrast. Multiplanar CT image reconstructions of the cervical spine were also generated.  RADIATION DOSE REDUCTION: This exam was performed according to the departmental dose-optimization program which includes automated exposure control, adjustment of the mA and/or kV according to patient size and/or use of iterative reconstruction technique. COMPARISON:  CT brain and cervical spine 05/27/2024 FINDINGS: CT HEAD FINDINGS Brain: No acute territorial infarction, hemorrhage or intracranial mass. The ventricles are nonenlarged Vascular: No hyperdense vessel or unexpected calcification. Skull: Normal. Negative for fracture or focal lesion. Sinuses/Orbits: No acute finding. Other: None CT CERVICAL SPINE FINDINGS Alignment: Straightening of the cervical spine. No subluxation. Facet alignment is normal Skull base and vertebrae: No fracture. Chronic lucent lesion  at C3, likely benign. Soft tissues and spinal canal: No prevertebral fluid or swelling. No visible canal hematoma. Disc levels: No significant canal stenosis or foraminal narrowing. Minimal disc space narrowing C5-C6 and C6-C7 Upper chest: Negative. Other: None IMPRESSION: Negative non contrasted CT appearance of the brain. Straightening of the cervical spine with minimal degenerative change. No acute osseous abnormality. Electronically Signed   By: Luke Bun M.D.   On: 06/09/2024 19:19         Keysean Savino, DO [facility] 06/10/2024, 5:14 PM    Dictation software may have been used to generate the above note. Typos may occur and escape review in typed/dictated notes. Please contact Dr Marsa directly for clarity if needed.  Staff may message me via secure chat in Epic  but this may not receive an immediate response,  please page me for urgent matters!  If 7PM-7AM, please contact night coverage [email or web address]

## 2024-06-10 NOTE — Discharge Instructions (Signed)
 Food resources  1) SolidMeds.hu.php  2) https://www.[email or web address]://www.franklin-[email or web address]/  3) https://[place].salvationarmy.org/[place]/hunger/  4) TattooLocations.ca  5) TanningPads.co.za  6) https://barton-williams.info/   Dial 2-1-1 or [phone] to speak with a call specialist  7) [email or web address].[email or web address]  8)[facility] [address]  [place], [place] [address] Directions Phone: [phone] Fax: [phone] Hours: Monday thru Sunday 1:00PM to 3:00PM, 2nd Monday 2:00PM [address] Thru, 4th Tuesday 4:99EF to 6:00PM Drive Thru [employer]

## 2024-06-10 NOTE — Hospital Course (Addendum)
 Hospital course / significant events:   HPI: Lawrence [name] is a 68 y.o. male with medical history significant of f CAD (s/p of stent x 4 and CABG 2016), HFrEF with EF 30-35%, a fib on Eliquis , PSVT,  HTN, HLD, DM, COPD on 2L O2, OSA not using CPAP, dCHF, anxiety, celiac disease, pancreatitis, who presents with syncope and chest pain -  Patient states that he started feeling dizzy and lightheaded 07/25 around 8:00 PM, then passed out briefly. He reports chest pain, which is located in the front chest, constant, sharp, pleuritic, 9 out of 10 in severity, nonradiating. Associated with mild SOB and mild dry cough.   recently hospitalized from 7/14 - 7/17 due to pneumonia and COPD exacerbation.   07/25: to ED. Troponin 12. Imaging neg. Admitted to hospitalist.  07/26: (+)orthostatics, Echo shows improved EF from prior, still having dizziness and unsteadiness, will hold entresto  (as well as previously held meds) and see how he is doing tomorrow  07/27: still dizzy but some improved from yesterday. Recurrent chest pain this afternoon, repeat ACS w/u. BP soft, starting midodrine  - suspect angina / COPD but if ACS r/o and symptoms persist will engage cardiology team  07/28: ACS r/o, persistent orthostatic hypotension, cardiology consulted - continue statin, imdur  120, ranexa , midodrine  2.5 mg tid wean as able, metoprolol  transitioned to succinate 25 mg daily, and continue hold dapagliflozin , spironolactone , lasix   restart outpatient as able.  07/29: chest pain again this morning, cardiology repeated w/u and cleared for discharge.    Consultants:  Cardiology  Procedures/Surgeries: none      ASSESSMENT & PLAN:   Syncope Etiology is not clear but suspect orthostatic/vasovagal even (though orthostatics here initially negative, were positive today), possible Afib RVR then self-converted. Suspect he has concussion also complicating dizziness with position changes   CT head negative for acute  intracranial abnormalities.   No focal neurodeficit on physical examination, low suspicions for stroke.   CHF appears compensated PE ruled out  No infection  Troponins neg/flat no ACS Orthostatics positive  Echo is showing improved function compared to alst available echo 07/2023 - EF 45-50% (improved from 30-35% w/ global hypokinesis), grade 1 diastolic dysfunction  fall precaution    Chest pain / angina CAD HLD CAD s/p CABG HFrEF not in acute exacerbation  Cardiology consult here - have cleared for discharge  Unable to ideally administer nitroglycerin  d/t low BP Continue meds:  Atorvastatin  80 Imdur  120 daily Ranexa  1000 bid Midodrine  2.5 mg tid wean as able Metoprolol  transitioned to succinate 25 mg daily dapagliflozin  10 mg daily  HOLD Entresto , spironolactone , lasix  to restart outpatient as able.  follow w/ [facility] cardiology outpatient   Chronic pain Opiate dependence/tolerance  PDMP reviewed, Oxy-APAP 10-325 #180 x30d = 6 pills per day = 20 mg q8h or 10 mg q4h  Continue home dose  NO further IV pain medications   Paroxysmal Afib Telemetry  Eliquis  Beta blocker   COPD (chronic obstructive pulmonary disease) (HCC): Stable Breztri  and as needed Mucinex  Albuterol  as needed    HTN (hypertension) Holding amlodipine  and lasix  d/t (+)orthostatics  Imdur , metoprolol   Diabetes Type 2 SSI here    Anxiety and depression Trintellix , trazodone  Xanax  prn    Bipolar disorder (HCC) Continue lithium   GERD PPI, H2B    overweight based on BMI: Body mass index is 27.9 kg/m.SABRA Significantly low or high BMI is associated with higher medical risk.  Underweight - under 18  overweight - 25 to 29 obese - 30  or more Class 1 obesity: BMI of 30.0 to 34 Class 2 obesity: BMI of 35.0 to 39 Class 3 obesity: BMI of 40.0 to 49 Super Morbid Obesity: BMI 50-59 Super-super Morbid Obesity: BMI 60+ Healthy nutrition and physical activity advised as adjunct to other disease management  and risk reduction treatments    DVT prophylaxis: eliquis  IV fluids: no continuous IV fluids  Nutrition: regular diet (can liberalize salt intake some to matc pt's home diet and possibly help w/ low BP but will monitor fluid status closely)  Central lines / other devices: none  Code Status: DNR ACP documentation reviewed:  none on file in [facility]  [facility] needs: home health  Medical barriers to dispo: ambulatory dysfunction / dizziness, he lives alone. Expected medical readiness for discharge tomorrow.

## 2024-06-10 NOTE — Care Management Obs Status (Signed)
 MEDICARE OBSERVATION STATUS NOTIFICATION   Patient Details  Name: Lawrence [name] MRN: [id number] Date of Birth: [birth year]   Medicare Observation Status Notification Given:  No (patient did not want a copy)    [name] 06/10/2024, 10:26 AM

## 2024-06-10 NOTE — Evaluation (Signed)
 Physical Therapy Evaluation Patient Details Name: Lawrence [name] MRN: [id number] DOB: 1956/09/11 Today's Date: 06/10/2024  History of Present Illness  68 y.o. male with medical history significant of f CAD (s/p of stent x 4 and CBAG 2016), sCHF with EF 30-35%, a fib on Eliquis , PSVT,  HTN, HLD, DM, COPD on 2L O2, OSA not using CPAP, dCHF, anxiety, celiac disease, pancreatitis, who presents with syncope and chest pain   Clinical Impression  Pt supine in bed upon entry and is agreeable for PT session. At baseline, pt is mod I with ADL's and ambulation with rollator; family assists with IADL's and transportation.   Pt presents with positive and symptomatic orthostatic hypotention, resulting in impairments in balance, endurance, and overall mobility. Due to deficits, pt required CGA for bed mobility, SBA for transfers, and SBA for gait in RW. Moderate dizziness reported with continued orthostatic testing, and decreased activity tolerance noted by RPE of 7-8/10 indicating vigorous activity.   Deficits limit the pt's ability to safely and independently perform ADL's, transfer, and ambulate. Pt will benefit from acute skilled PT services to address deficits for return to baseline function. Pt will benefit from post acute therapy services to address deficits for return to baseline function.  Encourage OOB mobility with nursing and mobility tech for meals and toileting for continued progress towards goals and maintenance of IND with functional mobility while hospitalized.   06/10/24 1539  Orthostatic Lying   BP- Lying 101/77  Pulse- Lying 76  Orthostatic Sitting  BP- Sitting 91/71  Pulse- Sitting 67 (mild dizziness)  Orthostatic Standing at 0 minutes  BP- Standing at 0 minutes (!) 85/51  Pulse- Standing at 0 minutes 66 (moderate dizziness)  Orthostatic Standing at 3 minutes  BP- Standing at 3 minutes 92/69  Pulse- Standing at 3 minutes 74 (moderate dizziness)       If plan is discharge  home, recommend the following: A little help with bathing/dressing/bathroom;Assist for transportation;Assistance with cooking/housework   Can travel by private vehicle    Yes    Equipment Recommendations None recommended by PT     Functional Status Assessment Patient has had a recent decline in their functional status and demonstrates the ability to make significant improvements in function in a reasonable and predictable amount of time.     Precautions / Restrictions Precautions Precautions: Fall Precaution/Restrictions Comments: orthostatic hypotension      Mobility  Bed Mobility               General bed mobility comments: CGA for UE support to sit EOB, HOB flat, use of BUE for support    Transfers                   General transfer comment: SBA for safety for anterior scooting towards EOB and posteriorly in recliner; SBA for safety to perform STS transfers at EOB and recliner with RW. Demonstrates good safety awareness with hand placement without cueing    Ambulation/Gait   Gait Distance (Feet): 3 Feet           General Gait Details: SBA for safety to ambulate short distance in room with RW, demonstrating slowed cadence and decreased step length/foot clearance bil. RPE of 7-8/10 indicating vigorous activity      Balance Overall balance assessment: Needs assistance Sitting-balance support: Feet supported Sitting balance-Leahy Scale: Good     Standing balance support: Bilateral upper extremity supported, Reliant on assistive device for balance Standing balance-Leahy Scale: Fair  Pertinent Vitals/Pain Pain Assessment Pain Assessment: No/denies pain    Home Living Family/patient expects to be discharged to:: Private residence Living Arrangements: Other relatives;Other (Comment) (lives with grandson) Available Help at Discharge: Family;Available 24 hours/day Type of Home: Apartment Home Access: Level  entry;Ramped entrance       Home Layout: One level Home Equipment: Shower seat - built in;Rollator (4 wheels);BSC/3in1;Hospital bed;Grab bars - toilet;Grab bars - tub/shower      Prior Function Prior Level of Function : Independent/Modified Independent;History of Falls (last six months)             Mobility Comments: Modified independent ambulating with rollator.  2 L O2 at night (PRN during the day) ADLs Comments: Pt endorses being Ind with self care tasks but grandson is available to assist. [name] performs most of IADLs.     Extremity/Trunk Assessment   Upper Extremity Assessment Upper Extremity Assessment: Defer to OT evaluation    Lower Extremity Assessment Lower Extremity Assessment: Overall WFL for tasks assessed (chronic N/T in bil feet)       Communication   Communication Communication: No apparent difficulties    Cognition Arousal: Alert Behavior During Therapy: WFL for tasks assessed/performed                             Following commands: Intact       Cueing Cueing Techniques: Verbal cues     General Comments General comments (skin integrity, edema, etc.): Positive and symptomatic orthostatics with 20pt DBP drop from sit>stand, moderate dizziness    Exercises Other Exercises Other Exercises: Pt educated re: PT role/POC, DC recommendations, orthostatics, call for help, OOB for meals and toileting, activity pacing/endurance/tolerance with DME and breaking down tasks.   Assessment/Plan    PT Assessment Patient needs continued PT services  PT Problem List Decreased activity tolerance;Decreased balance;Decreased mobility;Cardiopulmonary status limiting activity       PT Treatment Interventions Gait training;Functional mobility training;Therapeutic activities;Therapeutic exercise;Balance training;Neuromuscular re-education    PT Goals (Current goals can be found in the Care Plan section)  Acute Rehab PT Goals Patient Stated Goal: go  home PT Goal Formulation: With patient Time For Goal Achievement: 06/24/24 Potential to Achieve Goals: Good    Frequency Min 1X/week        AM-PAC PT 6 Clicks Mobility  Outcome Measure Help needed turning from your back to your side while in a flat bed without using bedrails?: A Little Help needed moving from lying on your back to sitting on the side of a flat bed without using bedrails?: A Little Help needed moving to and from a bed to a chair (including a wheelchair)?: A Little Help needed standing up from a chair using your arms (e.g., wheelchair or bedside chair)?: A Little Help needed to walk in hospital room?: A Little Help needed climbing 3-5 steps with a railing? : A Little 6 Click Score: 18    End of Session Equipment Utilized During Treatment: Gait belt Activity Tolerance: Patient tolerated treatment well Patient left: in chair;with chair alarm set Nurse Communication: Mobility status PT Visit Diagnosis: Unsteadiness on feet (R26.81);Dizziness and giddiness (R42)    Time: 8671-8653 PT Time Calculation (min) (ACUTE ONLY): 18 min   Charges:   PT Evaluation $PT Eval Moderate Complexity: 1 Mod PT Treatments $Therapeutic Activity: 8-22 mins PT General Charges $$ ACUTE PT VISIT: 1 Visit        Camie CHARLENA Kluver, PT, DPT 3:50 PM,06/10/24 Physical  Therapist - [place] [facility]

## 2024-06-10 NOTE — Progress Notes (Signed)
  Echocardiogram 2D Echocardiogram has been performed. Definity  IV ultrasound imaging agent used on this study.  Lawrence [name] 06/10/2024, 1:32 PM

## 2024-06-10 NOTE — Plan of Care (Signed)

## 2024-06-11 DIAGNOSIS — I25112 Atherosclerotic heart disease of native coronary artery with refractory angina pectoris: Secondary | ICD-10-CM

## 2024-06-11 DIAGNOSIS — R55 Syncope and collapse: Secondary | ICD-10-CM | POA: Diagnosis not present

## 2024-06-11 LAB — GLUCOSE, CAPILLARY
Glucose-Capillary: 153 mg/dL — ABNORMAL HIGH (ref 70–99)
Glucose-Capillary: 259 mg/dL — ABNORMAL HIGH (ref 70–99)
Glucose-Capillary: 115 mg/dL — ABNORMAL HIGH (ref 70–99)
Glucose-Capillary: 170 mg/dL — ABNORMAL HIGH (ref 70–99)

## 2024-06-11 LAB — TROPONIN I (HIGH SENSITIVITY)
Troponin I (High Sensitivity): 9 ng/L (ref <18)
Troponin I (High Sensitivity): 9 ng/L (ref <18)
Troponin I (High Sensitivity): 10 ng/L (ref <18)

## 2024-06-11 MED ORDER — MIDODRINE HCL 5 MG PO TABS
2.5000 mg | ORAL_TABLET | Freq: Three times a day (TID) | ORAL | Status: DC
  Administered 2024-06-11 – 2024-06-13 (×6): 2.5 mg via ORAL
  Filled 2024-06-11 (×6): qty 1

## 2024-06-11 MED ORDER — ONDANSETRON 4 MG PO TBDP
8.0000 mg | ORAL_TABLET | Freq: Once | ORAL | Status: AC
  Administered 2024-06-11: 8 mg via ORAL
  Filled 2024-06-11: qty 2

## 2024-06-11 MED ORDER — ISOSORBIDE MONONITRATE ER 30 MG PO TB24
120.0000 mg | ORAL_TABLET | Freq: Every day | ORAL | Status: DC
  Administered 2024-06-12 – 2024-06-13 (×2): 120 mg via ORAL
  Filled 2024-06-11 (×2): qty 4

## 2024-06-11 MED ORDER — MORPHINE SULFATE (PF) 2 MG/ML IV SOLN
2.0000 mg | INTRAVENOUS | Status: DC | PRN
  Administered 2024-06-12 (×2): 2 mg via INTRAVENOUS
  Filled 2024-06-11 (×2): qty 1

## 2024-06-11 MED ORDER — ISOSORBIDE MONONITRATE ER 30 MG PO TB24
60.0000 mg | ORAL_TABLET | Freq: Every day | ORAL | Status: DC
  Administered 2024-06-11: 60 mg via ORAL
  Filled 2024-06-11: qty 2

## 2024-06-11 NOTE — Plan of Care (Signed)
  Problem: Education: Goal: Knowledge of General Education information will improve Description: Including pain rating scale, medication(s)/side effects and non-pharmacologic comfort measures Outcome: Progressing   Problem: Health Behavior/Discharge Planning: Goal: Ability to manage health-related needs will improve Outcome: Progressing   Problem: Clinical Measurements: Goal: Ability to maintain clinical measurements within normal limits will improve Outcome: Progressing   Problem: Pain Managment: Goal: General experience of comfort will improve and/or be controlled Outcome: Not Progressing   Problem: Safety: Goal: Ability to remain free from injury will improve Outcome: Progressing   Problem: Skin Integrity: Goal: Risk for impaired skin integrity will decrease Outcome: Progressing

## 2024-06-11 NOTE — Progress Notes (Signed)
 PROGRESS NOTE    Lawrence [name]   FMW:[id number] DOB: 1956-10-20  DOA: 06/09/2024 Date of Service: 06/11/24 which is hospital day 0  PCP: Lawrence Nancyann BRAVO, MD    Hospital course / significant events:   HPI: Lawrence [name] is a 68 y.o. male with medical history significant of f CAD (s/p of stent x 4 and CABG 2016), HFrEF with EF 30-35%, a fib on Eliquis , PSVT,  HTN, HLD, DM, COPD on 2L O2, OSA not using CPAP, dCHF, anxiety, celiac disease, pancreatitis, who presents with syncope and chest pain -  Patient states that he started feeling dizzy and lightheaded 07/25 around 8:00 PM, then passed out briefly. He reports chest pain, which is located in the front chest, constant, sharp, pleuritic, 9 out of 10 in severity, nonradiating. Associated with mild SOB and mild dry cough.   recently hospitalized from 7/14 - 7/17 due to pneumonia and COPD exacerbation.   07/25: to ED. Troponin 12. Imaging neg. Admitted to hospitalist.  07/26: (+)orthostatics, Echo shows improved EF from prior, still having dizziness and unsteadiness, will hold entresto  (as well as previously held meds) and see how he is doing tomorrow  07/27: still dizzy but some improved from yesterday. Recurrent chest pain this afternoon, repeat ACS w/u. BP soft, starting midodrine  - suspect angina / COPD but if ACS r/o and symptoms persist will engage cardiology team    Consultants:  none  Procedures/Surgeries: none      ASSESSMENT & PLAN:   Syncope Etiology is not clear but suspect orthostatic/vasovagal even (though orthostatics here initially negative, were positive today), possible Afib RVR then self-converted. Suspect he has concussion also complicating dizziness with position changes   CT head negative for acute intracranial abnormalities.   No focal neurodeficit on physical examination, low suspicions for stroke.   CHF appears compensated PE ruled out  No infection  Troponins neg/flat no ACS Orthostatics positive   Echo is showing improved function compared to alst available echo 07/2023 - EF 45-50% (improved from 30-35% w/ global hypokinesis), grade 1 diastolic dysfunction  fall precaution IVF: NS 50 cc/h PT/OT eval and treat   Chest pain likely angina ACS workup negative couple days ago, repeat now: EKG, troponin, telemetry.  Unable to administer nitroglycerin  d/t low BP If CP without ACS persists will consult cardiology tomorrow  Put imdur  back up to previous dose   Chronic pain Opiate dependence/tolerance  PDMP reviewed, Oxy-APAP 10-325 #180 x30d = 6 pills per day = 20 mg q8h or 10 mg q4h  Continue home dose  NO further IV pain medications   CAD (coronary artery disease)  Patient is on Eliquis , will not start aspirin . Oxycodone , Tylenol  for pain Coreg , Imdur , Lipitor , ranolazine  Home meds - metoprolol  and carvedilol  are on his list wil continue low dose carvedilol  only given orthostatics  As needed nitroglycerin  w/ BP holding parameters    Paroxysmal Afib Telemetry  Eliquis  Beta blocker - Carvedilol  (metoprolol  also on list, will hold this d/t hypotension / syncope)   Chronic combined systolic (congestive) and diastolic (congestive) heart failure not in acute exacerbation Hold Lasix  and spironolactone  due to syncope Continue entresto  for now, he in on lowest dose    HLD (hyperlipidemia) Lipitor    COPD (chronic obstructive pulmonary disease) (HCC): Stable Breztri  and as needed Mucinex  Albuterol  as needed    HTN (hypertension) Holding amlodipine  d/t (+)orthostatics  Imdur , Coreg   Diabetes Type 2 SSI here    Anxiety and depression Trintellix , trazodone  Xanax  prn  Bipolar disorder (HCC) Continue lithium   GERD PPI, H2B    overweight based on BMI: Body mass index is 27.9 kg/m.Lawrence [name]    DVT prophylaxis: eliquis  IV fluids: no continuous IV fluids  Nutrition: regular diet (can liberalize salt intake some to matc pt's home diet and possibly help w/ low BP but will monitor fluid status closely)  Central lines / other devices: none  Code Status: DNR ACP documentation reviewed:  none on file in [facility]  [facility] needs: home health  Medical barriers to dispo: ambulatory dysfunction / dizziness, he lives alone. Expected medical readiness for discharge tomorrow.              Subjective / Brief ROS:  Checking in w/ him this morning, reports still feeling dizzy / lightheaded when walking but a bit improved. Later this afternoon I revisited him to see about discharge readiness, at that time he reported initially concern for nausea same as accompanies his usual dizziness, he then complained of chest pain like something is on my chest starting several minutes ago, he did not alert staff to new chest pain at its onset.  Denies new weakness.  Tolerating diet .  Reports no concerns w/ urination/defecation.   Family Communication: none at this time     Objective Findings:  Vitals:   06/11/24 0410 06/11/24 0641 06/11/24 0743 06/11/24 1235  BP:  104/72 91/68 117/79  Pulse:  63 73 79  Resp:  18 18   Temp:  97.7 F (36.5 C) 97.6 F (36.4 C)   TempSrc:      SpO2:  95% 94% 97%  Weight: 79.9 kg     Height:        Intake/Output Summary (Last 24 hours) at 06/11/2024 1532 Last data filed at 06/11/2024 1532 Gross per 24 hour  Intake 120 ml  Output 2450 ml  Net -2330 ml   Filed Weights   06/10/24 0013 06/10/24 0459 06/11/24 0410  Weight: 79.5 kg 80.8 kg 79.9 kg    Examination:  Physical Exam Constitutional:       General: He is not in acute distress. Cardiovascular:     Rate and Rhythm: Normal rate and regular rhythm.  Pulmonary:     Effort: Pulmonary effort is normal.     Breath sounds: Wheezing (faint) present. No rales.  Musculoskeletal:     Right lower leg: No edema.     Left lower leg: No edema.  Skin:    General: Skin is warm and dry.  Neurological:     Mental Status: He is alert and oriented to person, place, and time. Mental status is at baseline.  Psychiatric:        Mood and Affect: Mood normal.        Behavior: Behavior normal.          Scheduled Medications:   allopurinol   300 mg Oral BID   apixaban   5 mg Oral BID   atorvastatin   80 mg Oral QHS   budesonide -glycopyrrolate -formoterol   2 puff Inhalation BID   carvedilol   3.125 mg Oral BID WC   cyanocobalamin   1,000 mcg Oral Daily   famotidine   20 mg Oral BID   insulin  aspart  0-5 Units Subcutaneous QHS   insulin  aspart  0-9 Units Subcutaneous TID WC   [START ON 06/12/2024] iron  polysaccharides  150 mg Oral Once per day on Monday Wednesday Friday   [START ON 06/12/2024] isosorbide  mononitrate  120 mg Oral Daily   lithium  carbonate  150 mg Oral BID   midodrine   2.5 mg Oral TID WC   omega-3 acid ethyl esters  1 g Oral Daily   pantoprazole   40 mg Oral BID   ranolazine   1,000 mg Oral BID   vortioxetine  HBr  10 mg Oral Daily    Continuous Infusions:    PRN Medications:  albuterol , ALPRAZolam , alum & mag hydroxide-simeth, dextromethorphan -guaiFENesin , methocarbamol , nitroGLYCERIN , ondansetron  (ZOFRAN ) IV, mouth rinse, oxyCODONE , senna-docusate, traZODone   Antimicrobials from admission:  Anti-infectives (From admission, onward)    None           Data Reviewed:  I have personally reviewed the following...  CBC: Recent Labs  Lab 06/09/24 2025 06/10/24 0319  WBC 8.6 7.3  NEUTROABS 5.7  --   HGB 17.1* 16.5  HCT 52.4* 50.8  MCV 87.3 88.8  PLT 180 188   Basic Metabolic Panel: Recent Labs  Lab  06/09/24 2215 06/10/24 0319  NA 139 135  K 4.6 4.0  CL 101 100  CO2 28 29  GLUCOSE 113* 103*  BUN 28* 26*  CREATININE 1.12 1.15  CALCIUM  9.7 9.4   GFR: Estimated Creatinine Clearance: 60.5 mL/min (by C-G formula based on SCr of 1.15 mg/dL). Liver Function Tests: Recent Labs  Lab 06/09/24 2215  AST 14*  ALT 18  ALKPHOS 48  BILITOT 1.4*  PROT 6.1*  ALBUMIN 3.4*   No results for input(s): LIPASE, AMYLASE in the last 168 hours. No results for input(s): AMMONIA in the last 168 hours. Coagulation Profile: No results for input(s): INR, PROTIME in the last 168 hours. Cardiac Enzymes: No results for input(s): CKTOTAL, CKMB, CKMBINDEX, TROPONINI in the last 168 hours. BNP (last 3 results) No results for input(s): PROBNP in the last 8760 hours. HbA1C: No results for input(s): HGBA1C in the last 72 hours. CBG: Recent Labs  Lab 06/10/24 1214 06/10/24 1616 06/10/24 2000 06/11/24 0744 06/11/24 1153  GLUCAP 123* 166* 143* 153* 259*   Lipid Profile: No results for input(s): CHOL, HDL, LDLCALC, TRIG, CHOLHDL, LDLDIRECT in the last 72 hours. Thyroid  Function Tests: No results for input(s): TSH, T4TOTAL, FREET4, T3FREE, THYROIDAB in the last 72 hours. Anemia Panel: No results for input(s): VITAMINB12, FOLATE, FERRITIN, TIBC, IRON , RETICCTPCT in the last 72 hours. Most Recent Urinalysis On File:     Component Value Date/Time   COLORURINE YELLOW (A) 05/29/2024 0942   APPEARANCEUR CLEAR (A) 05/29/2024 0942   APPEARANCEUR Clear 05/25/2019 1554   LABSPEC 1.013 05/29/2024 0942   LABSPEC 1.012 03/07/2015 2143   PHURINE 7.0 05/29/2024 0942   GLUCOSEU >=500 (A) 05/29/2024 0942   GLUCOSEU Negative 03/07/2015 2143   HGBUR SMALL (A) 05/29/2024 0942   BILIRUBINUR NEGATIVE 05/29/2024 0942   BILIRUBINUR Negative 05/25/2019 1554   BILIRUBINUR Negative 03/07/2015 2143   KETONESUR 20 (A) 05/29/2024 0942   PROTEINUR NEGATIVE  05/29/2024 0942   UROBILINOGEN 0.2 03/26/2017 0907   NITRITE NEGATIVE 05/29/2024 0942   LEUKOCYTESUR NEGATIVE 05/29/2024 0942   LEUKOCYTESUR Negative 03/07/2015 2143   Sepsis Labs: @LABRCNTIP (procalcitonin:4,lacticidven:4) Microbiology: No results found for this or any  previous visit (from the past 240 hours).    Radiology Studies last 3 days: ECHOCARDIOGRAM COMPLETE Result Date: 06/10/2024    ECHOCARDIOGRAM REPORT   Patient Name:   Lawrence [name] [facility] Date of Exam: 06/10/2024 Medical Rec #:  [id number]       Height:       67.0 in Accession #:    [id number]      Weight:       178.1 lb Date of Birth:  [birth year]       BSA:          1.925 m Patient Age:    68 years        BP:           133/89 mmHg Patient Gender: M               HR:           76 bpm. Exam Location:  [facility] Procedure: 2D Echo, Cardiac Doppler, Color Doppler and Intracardiac            Opacification Agent (Both Spectral and Color Flow Doppler were            utilized during procedure). Indications:     Syncope R55  History:         Patient has prior history of Echocardiogram examinations, most                  recent 08/08/2023.  Sonographer:     Thedora Louder RDCS, FASE Referring Phys:  [id number] XILIN NIU Diagnosing Phys: Cara JONETTA Lovelace MD  Sonographer Comments: Technically difficult study due to poor echo windows and suboptimal apical window. Image acquisition challenging due to patient body habitus and Image acquisition challenging due to respiratory motion. IMPRESSIONS  1. Left ventricular ejection fraction, by estimation, is 45 to 50%. The left ventricle has mildly decreased function. The left ventricle demonstrates global hypokinesis. There is mild concentric left ventricular hypertrophy. Left ventricular diastolic parameters are consistent with Grade I diastolic dysfunction (impaired relaxation).  2. Right ventricular systolic function is normal. The right ventricular size is normal.  3. The mitral valve is normal in structure. No evidence  of mitral valve regurgitation.  4. The aortic valve is normal in structure. Aortic valve regurgitation is not visualized. FINDINGS  Left Ventricle: Left ventricular ejection fraction, by estimation, is 45 to 50%. The left ventricle has mildly decreased function. The left ventricle demonstrates global hypokinesis. Definity  contrast agent was given IV to delineate the left ventricular  endocardial borders. Strain was performed and the global longitudinal strain is indeterminate. The left ventricular internal cavity size was normal in size. There is mild concentric left ventricular hypertrophy. Left ventricular diastolic parameters are  consistent with Grade I diastolic dysfunction (impaired relaxation). Right Ventricle: The right ventricular size is normal. No increase in right ventricular wall thickness. Right ventricular systolic function is normal. Left Atrium: Left atrial size was normal in size. Right Atrium: Right atrial size was normal in size. Pericardium: There is no evidence of pericardial effusion. Mitral Valve: The mitral valve is normal in structure. No evidence of mitral valve regurgitation. Tricuspid Valve: The tricuspid valve is normal in structure. Tricuspid valve regurgitation is not demonstrated. Aortic Valve: The aortic valve is normal in structure. Aortic valve regurgitation is not visualized. Aortic regurgitation PHT measures 747 msec. Aortic valve peak gradient measures 6.2 mmHg. Pulmonic Valve: The pulmonic valve was normal in structure. Pulmonic valve regurgitation is not visualized. Aorta: The ascending aorta was  not well visualized. IAS/Shunts: No atrial level shunt detected by color flow Doppler. Additional Comments: 3D was performed not requiring image post processing on an independent workstation and was indeterminate.  LEFT VENTRICLE PLAX 2D LVIDd:         2.80 cm     Diastology LVIDs:         2.20 cm     LV e' medial:    10.80 cm/s LV PW:         1.40 cm     LV E/e' medial:  6.3 LV IVS:         1.30 cm     LV e' lateral:   13.10 cm/s LVOT diam:     2.00 cm     LV E/e' lateral: 5.2 LV SV:         53 LV SV Index:   28 LVOT Area:     3.14 cm  LV Volumes (MOD) LV vol d, MOD A4C: 74.4 ml LV vol s, MOD A4C: 37.1 ml LV SV MOD A4C:     74.4 ml RIGHT VENTRICLE RV Basal diam:  2.90 cm RV S prime:     7.62 cm/s LEFT ATRIUM           Index        RIGHT ATRIUM           Index LA diam:      5.10 cm 2.65 cm/m   RA Area:     13.20 cm LA Vol (A2C): 82.8 ml 43.01 ml/m  RA Volume:   28.10 ml  14.60 ml/m LA Vol (A4C): 47.7 ml 24.78 ml/m  AORTIC VALVE                 PULMONIC VALVE AV Area (Vmax): 2.33 cm     PV Vmax:        0.67 m/s AV Vmax:        124.00 cm/s  PV Peak grad:   1.8 mmHg AV Peak Grad:   6.2 mmHg     RVOT Peak grad: 2 mmHg LVOT Vmax:      91.80 cm/s LVOT Vmean:     60.800 cm/s LVOT VTI:       0.169 m AI PHT:         747 msec  AORTA Ao Root diam: 3.70 cm Ao Asc diam:  3.20 cm MITRAL VALVE MV Area (PHT): 4.29 cm    SHUNTS MV Decel Time: 177 msec    Systemic VTI:  0.17 m MV E velocity: 68.10 cm/s  Systemic Diam: 2.00 cm Cara JONETTA Lovelace MD Electronically signed by Cara JONETTA Lovelace MD Signature Date/Time: 06/10/2024/3:38:28 PM    Final    CT Angio Chest Pulmonary Embolism (PE) W or WO Contrast Result Date: 06/10/2024 CLINICAL DATA:  Syncope/presyncope, cerebrovascular cause suspected EXAM: CT ANGIOGRAPHY CHEST WITH CONTRAST TECHNIQUE: Multidetector CT imaging of the chest was performed using the standard protocol during bolus administration of intravenous contrast. Multiplanar CT image reconstructions and MIPs were obtained to evaluate the vascular anatomy. RADIATION DOSE REDUCTION: This exam was performed according to the departmental dose-optimization program which includes automated exposure control, adjustment of the mA and/or kV according to patient size and/or use of iterative reconstruction technique. CONTRAST:  OMNIPAQUE  IOHEXOL  350 MG/ML SOLN COMPARISON:  05/27/2024 FINDINGS:  Cardiovascular: Adequate opacification of the pulmonary arterial tree. No intraluminal filling defect identified to suggest acute pulmonary embolism. Central pulmonary arteries are mildly enlarged in keeping with changes of  pulmonary arterial hypertension. Status post coronary artery bypass grafting. Cardiac size is mildly enlarged, stable. No pericardial effusion. Moderate atherosclerotic calcification within the thoracic aorta. No aortic aneurysm. Mediastinum/Nodes: No enlarged mediastinal, hilar, or axillary lymph nodes. Thyroid  gland, trachea, and esophagus demonstrate no significant findings. Small hiatal hernia Lungs/Pleura: Previously noted extensive pulmonary infiltrate throughout the right lung has significantly improved in the interval with mild residual peribronchial and centrilobular nodular infiltrate. Mild emphysema. Stable bronchial wall thickening in keeping with airway inflammation. Scattered airway impaction noted within right lower lobe. No central obstructing mass. No pneumothorax or pleural effusion. Upper Abdomen: No acute abnormality. Musculoskeletal: No acute bone abnormality. No lytic or blastic bone lesion. Osseous structures are age appropriate. Review of the MIP images confirms the above findings. IMPRESSION: 1. No pulmonary embolism. 2. Mild enlargement of the central pulmonary arteries in keeping with changes of pulmonary arterial hypertension. 3. Mild cardiomegaly. Status post coronary artery bypass grafting. 4. Improved airspace infiltrate throughout the right lung in keeping a resolving infectious process 5. Emphysema.  Airway inflammation. Aortic Atherosclerosis (ICD10-I70.0) and Emphysema (ICD10-J43.9). Electronically Signed   By: Dorethia Molt M.D.   On: 06/10/2024 02:56   DG Lumbar Spine 2-3 Views Result Date: 06/09/2024 CLINICAL DATA:  Recent fall with low back pain, initial encounter EXAM: LUMBAR SPINE - 3 VIEW COMPARISON:  03/28/2024 FINDINGS: Five lumbar type vertebral  bodies are well visualized. Vertebral body height is well maintained. Mild osteophytic changes are seen. Degenerative change with mild anterolisthesis of L4 on L5 is again noted. Aortic calcifications are seen. IMPRESSION: Multilevel degenerative change without acute abnormality. Electronically Signed   By: Oneil Devonshire M.D.   On: 06/09/2024 19:29   DG Chest 2 View Result Date: 06/09/2024 CLINICAL DATA:  Dizziness and chest pain EXAM: CHEST - 2 VIEW COMPARISON:  05/29/2024 FINDINGS: Cardiac shadow is stable. Postsurgical changes are again noted. Aortic calcifications are seen. The lungs are well aerated bilaterally. No focal infiltrate or effusion is seen. No bony abnormality is noted. IMPRESSION: No active cardiopulmonary disease. Electronically Signed   By: Oneil Devonshire M.D.   On: 06/09/2024 19:28   CT Head Wo Contrast Result Date: 06/09/2024 CLINICAL DATA:  Dizziness fall LOC EXAM: CT HEAD WITHOUT CONTRAST CT CERVICAL SPINE WITHOUT CONTRAST TECHNIQUE: Multidetector CT imaging of the head and cervical spine was performed following the standard protocol without intravenous contrast. Multiplanar CT image reconstructions of the cervical spine were also generated. RADIATION DOSE REDUCTION: This exam was performed according to the departmental dose-optimization program which includes automated exposure control, adjustment of the mA and/or kV according to patient size and/or use of iterative reconstruction technique. COMPARISON:  CT brain and cervical spine 05/27/2024 FINDINGS: CT HEAD FINDINGS Brain: No acute territorial infarction, hemorrhage or intracranial mass. The ventricles are nonenlarged Vascular: No hyperdense vessel or unexpected calcification. Skull: Normal. Negative for fracture or focal lesion. Sinuses/Orbits: No acute finding. Other: None CT CERVICAL SPINE FINDINGS Alignment: Straightening of the cervical spine. No subluxation. Facet alignment is normal Skull base and vertebrae: No fracture. Chronic  lucent lesion at C3, likely benign. Soft tissues and spinal canal: No prevertebral fluid or swelling. No visible canal hematoma. Disc levels: No significant canal stenosis or foraminal narrowing. Minimal disc space narrowing C5-C6 and C6-C7 Upper chest: Negative. Other: None IMPRESSION: Negative non contrasted CT appearance of the brain. Straightening of the cervical spine with minimal degenerative change. No acute osseous abnormality. Electronically Signed   By: Luke Bun M.D.   On: 06/09/2024 19:19  CT Cervical Spine Wo Contrast Result Date: 06/09/2024 CLINICAL DATA:  Dizziness fall LOC EXAM: CT HEAD WITHOUT CONTRAST CT CERVICAL SPINE WITHOUT CONTRAST TECHNIQUE: Multidetector CT imaging of the head and cervical spine was performed following the standard protocol without intravenous contrast. Multiplanar CT image reconstructions of the cervical spine were also generated. RADIATION DOSE REDUCTION: This exam was performed according to the departmental dose-optimization program which includes automated exposure control, adjustment of the mA and/or kV according to patient size and/or use of iterative reconstruction technique. COMPARISON:  CT brain and cervical spine 05/27/2024 FINDINGS: CT HEAD FINDINGS Brain: No acute territorial infarction, hemorrhage or intracranial mass. The ventricles are nonenlarged Vascular: No hyperdense vessel or unexpected calcification. Skull: Normal. Negative for fracture or focal lesion. Sinuses/Orbits: No acute finding. Other: None CT CERVICAL SPINE FINDINGS Alignment: Straightening of the cervical spine. No subluxation. Facet alignment is normal Skull base and vertebrae: No fracture. Chronic lucent lesion at C3, likely benign. Soft tissues and spinal canal: No prevertebral fluid or swelling. No visible canal hematoma. Disc levels: No significant canal stenosis or foraminal narrowing. Minimal disc space narrowing C5-C6 and C6-C7 Upper chest: Negative. Other: None IMPRESSION:  Negative non contrasted CT appearance of the brain. Straightening of the cervical spine with minimal degenerative change. No acute osseous abnormality. Electronically Signed   By: Luke Bun M.D.   On: 06/09/2024 19:19         Laneta Blunt, DO [facility] 06/11/2024, 3:32 PM    Dictation software may have been used to generate the above note. Typos may occur and escape review in typed/dictated notes. Please contact Dr Blunt directly for clarity if needed.  Staff may message me via secure chat in Epic  but this may not receive an immediate response,  please page me for urgent matters!  If 7PM-7AM, please contact night coverage [email or web address]

## 2024-06-11 NOTE — Plan of Care (Signed)
  Problem: Activity: Goal: Risk for activity intolerance will decrease Outcome: Progressing   Problem: Nutrition: Goal: Adequate nutrition will be maintained Outcome: Progressing   Problem: Pain Managment: Goal: General experience of comfort will improve and/or be controlled Outcome: Progressing   Problem: Safety: Goal: Ability to remain free from injury will improve Outcome: Progressing   Problem: Skin Integrity: Goal: Risk for impaired skin integrity will decrease Outcome: Progressing   Problem: Activity: Goal: Risk for activity intolerance will decrease Outcome: Progressing   Problem: Nutrition: Goal: Adequate nutrition will be maintained Outcome: Progressing   Problem: Pain Managment: Goal: General experience of comfort will improve and/or be controlled Outcome: Progressing   Problem: Safety: Goal: Ability to remain free from injury will improve Outcome: Progressing   Problem: Skin Integrity: Goal: Risk for impaired skin integrity will decrease Outcome: Progressing

## 2024-06-11 NOTE — Progress Notes (Signed)
 Morphine  2 mg iv q4h prn Troponin x2  EKG  To my name please per Dr. Lawence

## 2024-06-12 ENCOUNTER — Encounter: Payer: Self-pay | Admitting: Internal Medicine

## 2024-06-12 DIAGNOSIS — R55 Syncope and collapse: Secondary | ICD-10-CM | POA: Diagnosis not present

## 2024-06-12 LAB — MAGNESIUM: Magnesium: 2.1 mg/dL (ref 1.7–2.4)

## 2024-06-12 LAB — GLUCOSE, CAPILLARY
Glucose-Capillary: 130 mg/dL — ABNORMAL HIGH (ref 70–99)
Glucose-Capillary: 174 mg/dL — ABNORMAL HIGH (ref 70–99)
Glucose-Capillary: 176 mg/dL — ABNORMAL HIGH (ref 70–99)
Glucose-Capillary: 120 mg/dL — ABNORMAL HIGH (ref 70–99)

## 2024-06-12 LAB — POTASSIUM: Potassium: 4.6 mmol/L (ref 3.5–5.1)

## 2024-06-12 LAB — TROPONIN I (HIGH SENSITIVITY)
Troponin I (High Sensitivity): 9 ng/L (ref <18)
Troponin I (High Sensitivity): 9 ng/L (ref <18)

## 2024-06-12 MED ORDER — DAPAGLIFLOZIN PROPANEDIOL 10 MG PO TABS: 10.0000 mg | ORAL_TABLET | Freq: Every day | ORAL | Status: DC

## 2024-06-12 MED ORDER — METOPROLOL SUCCINATE ER 25 MG PO TB24
25.0000 mg | ORAL_TABLET | Freq: Every day | ORAL | Status: DC
  Administered 2024-06-13: 25 mg via ORAL
  Filled 2024-06-12: qty 1

## 2024-06-12 MED ORDER — ENSURE PLUS HIGH PROTEIN PO LIQD
237.0000 mL | Freq: Two times a day (BID) | ORAL | Status: DC
  Administered 2024-06-12 – 2024-06-13 (×4): 237 mL via ORAL

## 2024-06-12 NOTE — Progress Notes (Addendum)
 Heart Failure Nurse Navigator Progress Note  PCP: [name], MD PCP-Cardiologist: [facility] [place], [place]  Advanced Heart Failure Clinic: Micaela Class, FNP Admission Diagnosis: Syncope, unspecified syncope type Admitted from: Home  Presentation:   Lawrence [name] presented with feeling of dizzy and lightheaded, thought he lost consciousness because he remembered waking up on the ground.  Was not sure how long he was unconscious for, complained of headache, back pain and acute on chronic lower back pain since the fall (2 days prior).  He also complained of sharp discomfort in his chest since being admitted for pneumonia a few weeks ago.  Pt with hx of hypertension, diabetes, CAD, CHF, A-fib on Eliquis , and COPD.  BNP 05/30/24-483.4 & 06/10/24-90.2 Chest x-ray: No active cardiopulmonary disease.  ECHO/ LVEF: 45-50%  Clinical Course:  Past Medical History:  Diagnosis Date   A-fib (HCC)    Anemia    Anginal pain (HCC)    Anxiety    Arthritis    Asthma    CAD (coronary artery disease)    a. 2002 CABGx2 (LIMA->LAD, VG->VG->OM1);  b. 09/2012 DES->OM;  c. 03/2015 PTCA of LAD [facility]) in setting of atretic LIMA; d. 05/2015 Cath [facility]): nonobs dzs; e. 06/2015 Cath ([facility]): LM nl, LAD 45p/d ISR, 50d, D1/2 small, LCX 50p/d ISR, OM1 70ost, 30 ISR, VG->OM1 50ost, 63m, LIMA->LAD 99p/d - atretic, RCA dom, nl; f.cath 10/16: 40-50%(FFR 0.90) pLAD, 75% (FFR 0.77) mLAD s/p PCI/DES, oRCA 40% (FFR0.95)   Cancer (HCC)    SKIN CANCER ON BACK   Celiac disease    Chronic diastolic CHF (congestive heart failure) (HCC)    a. 06/2009 Echo: EF 60-65%, Gr 1 DD, triv AI, mildly dil LA, nl RV.   COPD (chronic obstructive pulmonary disease) (HCC)    a. Chronic bronchitis and emphysema.   DDD (degenerative disc disease), lumbar    Diverticulosis    Dysrhythmia    Essential hypertension    GERD (gastroesophageal reflux disease)    History of hiatal hernia    History of kidney stones    H/O   History of  tobacco abuse    a. Quit 2014.   Myocardial infarction [facility]) 2002   4 STENTS   Pancreatitis    PSVT (paroxysmal supraventricular tachycardia) (HCC)    a. 10/2012 Noted on [facility] Patch.   Sleep apnea    LOST CORD TO CPAP -ONLY 02 @ BEDTIME   Tubular adenoma of colon    Type II diabetes mellitus (HCC)      Social History   Socioeconomic History   Marital status: Widowed    Spouse name: Not on file   Number of children: 1   Years of education: Not on file   Highest education level: 10th grade  Occupational History   Occupation: Disabled   Occupation: on Tree surgeon  Tobacco Use   Smoking status: Former    Current packs/day: 0.00    Average packs/day: 3.0 packs/day for 50.0 years (150.0 ttl pk-yrs)    Types: Cigarettes    Start date: 04/23/1963    Quit date: 04/22/2013    Years since quitting: 11.1   Smokeless tobacco: Never   Tobacco comments:    Reports not smoking for approx 8 years.  Vaping Use   Vaping status: Never Used  Substance and Sexual Activity   Alcohol use: No    Comment: remotely quit alcohol use. Hx of heavy alcohol use.   Drug use: Not Currently    Types: Marijuana  Sexual activity: Not Currently  Other Topics Concern   Not on file  Social History Narrative   Pt lives in [place] with wife.  Does not routinely exercise.   Social Drivers of Corporate investment banker Strain: Low Risk  (06/12/2024)   Overall Financial Resource Strain (CARDIA)    Difficulty of Paying Living Expenses: Not hard at all  Food Insecurity: Food Insecurity Present (06/12/2024)   Hunger Vital Sign    Worried About Running Out of Food in the Last Year: Sometimes true    Ran Out of Food in the Last Year: Not on file  Transportation Needs: No Transportation Needs (06/12/2024)   PRAPARE - Administrator, Civil Service (Medical): No    Lack of Transportation (Non-Medical): No  Physical Activity: Insufficiently Active (03/14/2024)   Exercise Vital Sign    Days of  Exercise per Week: 3 days    Minutes of Exercise per Session: 20 min  Stress: No Stress Concern Present (03/14/2024)   [employer] of Occupational Health - Occupational Stress Questionnaire    Feeling of Stress : Only a little  Recent Concern: Stress - Stress Concern Present (01/13/2024)   [employer] of Occupational Health - Occupational Stress Questionnaire    Feeling of Stress : To some extent  Social Connections: Socially Isolated (06/10/2024)   Social Connection and Isolation Panel    Frequency of Communication with Friends and Family: More than three times a week    Frequency of Social Gatherings with Friends and Family: Once a week    Attends Religious Services: Never    Database administrator or Organizations: No    Attends Banker Meetings: Never    Marital Status: Widowed   Education Assessment and Provision:  Detailed education and instructions provided on heart failure disease management including the following:  Signs and symptoms of Heart Failure When to call the physician Importance of daily weights Low sodium diet Fluid restriction Medication management Anticipated future follow-up appointments  Patient education given on each of the above topics.  Patient acknowledges understanding via teach back method and acceptance of all instructions.  Education Materials:  Living Better With Heart Failure Booklet, HF zone tool, & Daily Weight Tracker Tool.  Patient has scale at home: No.  Will provide him with one. Patient has pill box at home: Yes    High Risk Criteria for Readmission and/or Poor Patient Outcomes: Heart failure hospital admissions (last 6 months): 0  No Show rate: 11% Difficult social situation: None Demonstrates medication adherence: Yes Primary Language: English Literacy level: Reading, Writing & Comprehension  Barriers of Care:   Transportation at times because he has to set rides up 3 days prior to scheduled  appointment  Considerations/Referrals:   Referral made to Heart Failure Pharmacist Stewardship: Yes Referral made to Heart Failure CSW/NCM TOC: Yes- for additional food resources. Hospital Follow-Up with Advanced Heart Failure Clinic: 06/19/24 @ 2:00 PM  Items for Follow-up on DC/TOC: Daily Weights Diet & Fluid Restrictions-(Also provided patient with handout for [employer] Handout) Continued Heart Failure Education  Charmaine Pines, Charity fundraiser, BSN [facility] Heart Failure Navigator Secure Chat Only

## 2024-06-12 NOTE — Progress Notes (Signed)
 Heart Failure Stewardship Pharmacy Note  PCP: [name], MD PCP-Cardiologist: None  HPI: Lawrence [name] is a 68 y.o. male with CAD, CHF, AF, PSVT, HTN, HLD, T2DM, COPD on 2L O2, OSA, celiac disease, gout, bipolar disorder, pancreatitis who presented with dizziness, lightheadedness, and chest pain. On admission, BNP was 90.2, HS-troponin was 12. Chest x-ray noted no active disease. Positive symptomatic orthostatics on 06/10/24.   Pertinent cardiac history: CABG in 2002. TTE [id number] with LVEF 60-65%. NSTEMI in 08/2023 with no culprit identified and LIMA to LAD restenosed. Underwent many LHCs. Most recent 10/2022 with patent SVG to OM1 and no culprit lesions. TTE 07/2023 with LVEF 30-35%, G2DD, severe RV dysfunction, moderate MR, mild to moderate AR. TTE 05/2024 with LVEF improved to 45-50%, G1DD.   Pertinent Lab Values: Creatinine  Date Value Ref Range Status  03/07/2015 0.84 mg/dL Final    Comment:    9.38-8.75 NOTE: New Reference Range  01/22/15    Creatinine, Ser  Date Value Ref Range Status  06/10/2024 1.15 0.61 - 1.24 mg/dL Final   BUN  Date Value Ref Range Status  06/10/2024 26 (H) 8 - 23 mg/dL Final  89/81/7975 19 8 - 27 mg/dL Final  95/78/7983 9 mg/dL Final    Comment:    3-79 NOTE: New Reference Range  01/22/15    Potassium  Date Value Ref Range Status  06/12/2024 4.6 3.5 - 5.1 mmol/L Final    Comment:    Performed at [facility], [address] [place], [place] [address]  03/07/2015 3.9 mmol/L Final    Comment:    3.5-5.1 NOTE: New Reference Range  01/22/15    Sodium  Date Value Ref Range Status  06/10/2024 135 135 - 145 mmol/L Final  09/03/2023 143 134 - 144 mmol/L Final  03/07/2015 143 mmol/L Final    Comment:    135-145 NOTE: New Reference Range  01/22/15    B Natriuretic Peptide  Date Value Ref Range Status  06/10/2024 90.2 0.0 - 100.0 pg/mL Final    Comment:    Performed at [facility], [address]  [place], [place] [address]   Magnesium   Date Value Ref Range Status  06/12/2024 2.1 1.7 - 2.4 mg/dL Final    Comment:    Performed at [facility], [address] [place], [place] [address]  05/24/2014 1.9 mg/dL Final    Comment:    8.1-7.5 THERAPEUTIC RANGE: 4-7 mg/dL TOXIC: > 10 mg/dL  -----------------------    Hemoglobin A1C  Date Value Ref Range Status  02/01/2024 5.8  Final    Comment:    eAG 120 @[facility]    TSH  Date Value Ref Range Status  12/28/2022 1.035 0.350 - 4.500 uIU/mL Final    Comment:    Performed by a 3rd Generation assay with a functional sensitivity of <=0.01 uIU/mL. Performed at [facility], [address] [place], [place] [address]     Vital Signs:  Temp:  [97.6 F (36.4 C)-97.9 F (36.6 C)] 97.6 F (36.4 C) (07/28 0828) Pulse Rate:  [50-79] 72 (07/28 0828) Cardiac Rhythm: Atrial fibrillation (07/28 0840) Resp:  [16-18] 16 (07/28 0828) BP: (108-134)/(74-85) 108/79 (07/28 0828) SpO2:  [92 %-97 %] 93 % (07/28 0828) Weight:  [82.8 kg (182 lb 8.7 oz)] 82.8 kg (182 lb 8.7 oz) (07/28 0406)  Intake/Output Summary (Last 24 hours) at 06/12/2024 0901 Last data filed at 06/12/2024 0604 Gross per 24 hour  Intake --  Output 2100 ml  Net -2100 ml    Current Heart Failure Medications:  Loop diuretic: none Beta-Blocker: carvedilol  3.125 mg BID ACEI/ARB/ARNI: none MRA: none SGLT2i: none Other: none  Prior to admission Heart Failure Medications:  Loop diuretic: furosemide  20 mg prn Beta-Blocker: carvedilol  3.125 mg BID ACEI/ARB/ARNI: Entresto  24-26 mg BID MRA: spironolactone  25 mg daily SGLT2i: Farxiga  10 mg daily Other: amlodipine  10 mg daily, isosorbide  ER 30 mg daily  Assessment: 1. Acute on chronic systolic heart failure (LVEF 45-50%)  , due to ICM. NYHA class II symptoms.  -Symptoms: Patient is suffering from orthostasis. Shortness of breath is attributed to COPD/emphysema and is at baseline.  -Volume: Appears to be hypovolemic  on exam, despite holding diuretics. Will follow any lab work up for hypotension. -Hemodynamics: BP is variable but generally WNL after holding BP meds. HR 70s. -BB: Currently on carvedilol  3.125 mg BID. Vasodilating beta blockers are commonly associated with postural hypotension and may require dose reduction. Additionally carvedilol  blocks the alpha 1 receptor agonized by midodrine , so patients should not be taking both. Also, patient has COPD and may benefit from more beta selectivity.  -ACEI/ARB/ARNI: PTA Entresto  held with symptomatic orthostasis. -MRA: Spironolactone  held with symptomatic orthostasis. -SGLT2i: Farxiga  10 mg daily held for orthostasis.  Plan: 1) Medication changes recommended at this time: -Consider holding carvedilol  vs transition to low dose bisoprolol or metoprolol  for orthostasis/COPD  2) Patient assistance: -Pending  3) Education: - Patient has been educated on current HF medications and potential additions to HF medication regimen - Patient verbalizes understanding that over the next few months, these medication doses may change and more medications may be added to optimize HF regimen - Patient has been educated on basic disease state pathophysiology and goals of therapy  Medication Assistance / Insurance Benefits Check: Does the patient have prescription insurance?    Type of insurance plan:  Does the patient qualify for medication assistance through manufacturers or grants? Pending   Outpatient Pharmacy: Prior to admission outpatient pharmacy: [facility]      Please do not hesitate to reach out with questions or concerns,  Jaun Bash, PharmD, CPP, BCPS, [facility] Heart Failure Pharmacist  Phone - [phone] 06/12/2024 10:55 AM

## 2024-06-12 NOTE — Consult Note (Signed)
 [facility] CLINIC CARDIOLOGY CONSULT NOTE       Patient ID: Lawrence [name] MRN: [id number] DOB/AGE: 03/22/1956 68 y.o.  Admit date: 06/09/2024 Referring Physician Dr. Marsa Primary Physician Gasper, Nancyann BRAVO, MD Primary Cardiologist [facility] Cardiology (Dr. [place] Lee) Reason for Consultation Medication regimen with orthostatic   HPI: Lawrence [name] is a 68 y.o. male  with a past medical history of coronary artery disease s/p CABG, multiple stents, ischemic cardiomyopathy, chronic HFrEF (improved EF 30% to 50%), hypertension, hyperlipidemia, paroxsymal atrial fibrillation, OSA, COPD, type 2 diabetes mellitus who presented to the ED on 06/09/2024 for syncope and chest pain. Prior to syncope event patient felt dizzy/lightheaded. Patient's home cardiac medications held due to orthostatic hypotension. Cardiology was consulted for further evaluation of cardiac medication regimen in setting of orthostasis.   Work up in the ED notable for Na 139, K 4.6, Cr 1.12, Hgb 17.1, plts 180. EKG with atrial fibrillation, rate 80s. Trops negative x2. CT head negative. CTA with no pulmonary embolism. CXR with no acute cardiopulmonary disease. Home cardiac medications held due to soft BP and orthostatic. Patient started on PO midodrine .   At the time of my evaluation this afternoon, patient was resting comfortably in hospital bed.  We discussed patient's symptoms in further detail.  Patient states at home as he got up from his couch he felt lightheaded/dizzy and LOC.  Patient states he has had syncopal episodes before due to lower blood pressures.  Patient states he has also had a Holter monitor placed the past due to these syncopal events.  Patient states currently while laying in his hospital bed he does not have any dizziness or lightheadedness.  Patient also denies any chest pain, palpitations, lower extremity swelling or SOB.   Pertinent Cardiac History (Most recent) LHC 10/2022   Prox LAD to Mid LAD lesion is 30%  stenosed.   Mid LAD lesion is 40% stenosed.   Prox RCA lesion is 15% stenosed.   Dist RCA lesion is 25% stenosed.   1st Mrg lesion is 70% stenosed.   RPAV-2 lesion is 60% stenosed.   RPAV-1 lesion is 85% stenosed with 85% stenosed side branch in 1st RPL.   RPDA-1 lesion is 90% stenosed.   RPDA-2 lesion is 85% stenosed.   Non-stenotic Mid LAD to Dist LAD lesion was previously treated.   Non-stenotic Prox Cx to Mid Cx lesion was previously treated.   Non-stenotic Origin to Prox Graft lesion was previously treated.   LIMA and is small.   There is mild left ventricular systolic dysfunction.   LV end diastolic pressure is mildly elevated.   The left ventricular ejection fraction is 45-50% by visual estimate.  08/16/2023 8-15 day Zio Monitor Patient had a min HR of 38 bpm, max HR of 148 bpm, and avg HR of 63 bpm.  Predominant underlying rhythm was Sinus Rhythm. First Degree AV Block was  present. 4 Supraventricular Tachycardia runs occurred, the run with the fastest  interval lasting 16 beats with a max rate of 133 bpm (avg 118 bpm); the run with  the fastest interval was also the longest. Atrial Fibrillation occurred (24% burden),  ranging from 42-148 bpm (avg of 77 bpm), the longest lasting 1 day 11 hours with  an avg rate of 80 bpm. Atrial Fibrillation was present at de-activation of device.  Isolated SVEs were rare (<1.0%, 796), SVE Couplets were rare (<1.0%, 33), and SVE  Triplets were rare (<1.0%, 11). Isolated VEs were rare (<1.0%, 4022), VE Couplets  were rare (<1.0%, 33), and VE Triplets were rare (<1.0%, 2).   The patient was noted to have a 24% atrial fibrillation burden.  He is appropriately anticoagulated with apixaban .   Review of systems complete and found to be negative unless listed above    Past Medical History:  Diagnosis Date   A-fib (HCC)    Anemia    Anginal pain (HCC)    Anxiety    Arthritis    Asthma    CAD (coronary artery disease)    a. 2002 CABGx2  (LIMA->LAD, VG->VG->OM1);  b. 09/2012 DES->OM;  c. 03/2015 PTCA of LAD [facility]) in setting of atretic LIMA; d. 05/2015 Cath [facility]): nonobs dzs; e. 06/2015 Cath ([facility]): LM nl, LAD 45p/d ISR, 50d, D1/2 small, LCX 50p/d ISR, OM1 70ost, 30 ISR, VG->OM1 50ost, 27m, LIMA->LAD 99p/d - atretic, RCA dom, nl; f.cath 10/16: 40-50%(FFR 0.90) pLAD, 75% (FFR 0.77) mLAD s/p PCI/DES, oRCA 40% (FFR0.95)   Cancer (HCC)    SKIN CANCER ON BACK   Celiac disease    Chronic diastolic CHF (congestive heart failure) (HCC)    a. 06/2009 Echo: EF 60-65%, Gr 1 DD, triv AI, mildly dil LA, nl RV.   COPD (chronic obstructive pulmonary disease) (HCC)    a. Chronic bronchitis and emphysema.   DDD (degenerative disc disease), lumbar    Diverticulosis    Dysrhythmia    Essential hypertension    GERD (gastroesophageal reflux disease)    History of hiatal hernia    History of kidney stones    H/O   History of tobacco abuse    a. Quit 2014.   Myocardial infarction [facility]) 2002   4 STENTS   Pancreatitis    PSVT (paroxysmal supraventricular tachycardia) (HCC)    a. 10/2012 Noted on Zio Patch.   Sleep apnea    LOST CORD TO CPAP -ONLY 02 @ BEDTIME   Tubular adenoma of colon    Type II diabetes mellitus (HCC)     Past Surgical History:  Procedure Laterality Date   BYPASS GRAFT     CARDIAC CATHETERIZATION N/A 07/12/2015   rocedure: Left Heart Cath and Cors/Grafts Angiography;  Surgeon: Victory LELON Sharps, MD;  Location: [facility] INVASIVE CV LAB;  Service: Cardiovascular;  Laterality: N/A;   CARDIAC CATHETERIZATION Right 10/07/2015   Procedure: Left Heart Cath and Cors/Grafts Angiography;  Surgeon: Denyse DELENA Bathe, MD;  Location: [facility] INVASIVE CV LAB;  Service: Cardiovascular;  Laterality: Right;   CARDIAC CATHETERIZATION N/A 04/06/2016   Procedure: Left Heart Cath and Coronary Angiography;  Surgeon: Cara JONETTA Lovelace, MD;  Location: [facility] INVASIVE CV LAB;  Service: Cardiovascular;  Laterality: N/A;   CARDIAC CATHETERIZATION  04/06/2016    Procedure: Bypass Graft Angiography;  Surgeon: Cara JONETTA Lovelace, MD;  Location: [facility] INVASIVE CV LAB;  Service: Cardiovascular;;   CARDIAC CATHETERIZATION N/A 11/02/2016   Procedure: Left Heart Cath and Cors/Grafts Angiography and possible PCI;  Surgeon: Cara JONETTA Lovelace, MD;  Location: [facility] INVASIVE CV LAB;  Service: Cardiovascular;  Laterality: N/A;   CARDIAC CATHETERIZATION N/A 11/02/2016   Procedure: Coronary Stent Intervention;  Surgeon: Cara JONETTA Lovelace, MD;  Location: [facility] INVASIVE CV LAB;  Service: Cardiovascular;  Laterality: N/A;   CARDIOVERSION N/A 06/29/2022   Procedure: CARDIOVERSION;  Surgeon: Hester Wolm PARAS, MD;  Location: [facility] ORS;  Service: Cardiovascular;  Laterality: N/A;   CHOLECYSTECTOMY     CIRCUMCISION N/A 06/09/2019   Procedure: CIRCUMCISION ADULT;  Surgeon: Francisca Redell BROCKS, MD;  Location: [facility] ORS;  Service: Urology;  Laterality: N/A;   COLONOSCOPY WITH PROPOFOL  N/A 04/01/2018   Procedure: COLONOSCOPY WITH PROPOFOL ;  Surgeon: Viktoria Lamar DASEN, MD;  Location: [facility] ENDOSCOPY;  Service: Endoscopy;  Laterality: N/A;   COLONOSCOPY WITH PROPOFOL  N/A 05/01/2023   Procedure: COLONOSCOPY WITH PROPOFOL ;  Surgeon: Unk Corinn Skiff, MD;  Location: [facility] ENDOSCOPY;  Service: Gastroenterology;  Laterality: N/A;   ESOPHAGEAL DILATION     ESOPHAGOGASTRODUODENOSCOPY (EGD) WITH PROPOFOL  N/A 04/01/2018   Procedure: ESOPHAGOGASTRODUODENOSCOPY (EGD) WITH PROPOFOL ;  Surgeon: Viktoria Lamar DASEN, MD;  Location: [facility] ENDOSCOPY;  Service: Endoscopy;  Laterality: N/A;   ESOPHAGOGASTRODUODENOSCOPY (EGD) WITH PROPOFOL  N/A 05/01/2023   Procedure: ESOPHAGOGASTRODUODENOSCOPY (EGD) WITH PROPOFOL ;  Surgeon: Unk Corinn Skiff, MD;  Location: [facility] ENDOSCOPY;  Service: Gastroenterology;  Laterality: N/A;   GIVENS CAPSULE STUDY  05/01/2023   Procedure: GIVENS CAPSULE STUDY;  Surgeon: Unk Corinn Skiff, MD;  Location: [facility] ENDOSCOPY;  Service: Gastroenterology;;   LEFT HEART CATH AND CORS/GRAFTS  ANGIOGRAPHY N/A 06/12/2019   Procedure: LEFT HEART CATH AND CORS/GRAFTS ANGIOGRAPHY;  Surgeon: Bosie Vinie LABOR, MD;  Location: [facility] INVASIVE CV LAB;  Service: Cardiovascular;  Laterality: N/A;   LEFT HEART CATH AND CORS/GRAFTS ANGIOGRAPHY N/A 03/11/2020   Procedure: LEFT HEART CATH AND CORS/GRAFTS ANGIOGRAPHY;  Surgeon: Ammon Blunt, MD;  Location: [facility] INVASIVE CV LAB;  Service: Cardiovascular;  Laterality: N/A;   LEFT HEART CATH AND CORS/GRAFTS ANGIOGRAPHY N/A 05/01/2021   Procedure: LEFT HEART CATH AND CORS/GRAFTS ANGIOGRAPHY;  Surgeon: Hester Wolm PARAS, MD;  Location: [facility] INVASIVE CV LAB;  Service: Cardiovascular;  Laterality: N/A;   LEFT HEART CATH AND CORS/GRAFTS ANGIOGRAPHY N/A 11/02/2022   Procedure: LEFT HEART CATH AND CORS/GRAFTS ANGIOGRAPHY;  Surgeon: Florencio Cara BIRCH, MD;  Location: [facility] INVASIVE CV LAB;  Service: Cardiovascular;  Laterality: N/A;   TONSILLECTOMY     VASCULAR SURGERY      Medications Prior to Admission  Medication Sig Dispense Refill Last Dose/Taking   albuterol  (PROVENTIL ) (2.5 MG/3ML) 0.083% nebulizer solution Take 3 mLs (2.5 mg total) by nebulization every 6 (six) hours as needed for wheezing or shortness of breath. 150 mL 2 Taking As Needed   allopurinol  (ZYLOPRIM ) 300 MG tablet TAKE 1 TABLET BY MOUTH TWICE A DAY 180 tablet 1 06/09/2024 Morning   ALPRAZolam  (XANAX ) 1 MG tablet TAKE 1 TABLET BY MOUTH 3 TIMES A DAY AS NEEDED 90 tablet 3 Taking As Needed   aluminum -magnesium  hydroxide-simethicone  (MAALOX) 200-200-20 MG/5ML SUSP Take 30 mLs by mouth 3 (three) times daily as needed.   Taking As Needed   amLODipine  (NORVASC ) 10 MG tablet Take 10 mg by mouth daily.   06/09/2024   apixaban  (ELIQUIS ) 5 MG TABS tablet Take 1 tablet (5 mg total) by mouth 2 (two) times daily. 60 tablet 5 06/09/2024 Morning   atorvastatin  (LIPITOR ) 80 MG tablet TAKE 1 TABLET BY MOUTH AT BEDTIME 90 tablet 4 06/08/2024   BREZTRI  AEROSPHERE 160-9-4.8 MCG/ACT AERO inhaler INHALE 2 PUFFS BY  MOUTH INTO THE LUNGS 2TIMES DAILY 10.7 g 11 06/09/2024   carvedilol  (COREG ) 3.125 MG tablet TAKE 1 TABLET BY MOUTH TWICE A DAY 60 tablet 5 06/09/2024 Morning   cetirizine  (ZYRTEC ) 10 MG tablet Take 1 tablet (10 mg total) by mouth at bedtime. 90 tablet 1 Taking   Cyanocobalamin  (VITAMIN B-12) 1000 MCG SUBL Place 1 tablet under the tongue daily.   06/09/2024   dapagliflozin  propanediol (FARXIGA ) 10 MG TABS tablet TAKE 1 TABLET BY MOUTH DAILY 30 tablet 5 06/09/2024   famotidine  (PEPCID ) 20 MG tablet Take 1 tablet (  20 mg total) by mouth 2 (two) times daily. 60 tablet 2 Taking   furosemide  (LASIX ) 20 MG tablet Take 1 tablet (20 mg total) by mouth daily as needed. 30 tablet 2 Taking As Needed   gabapentin  (NEURONTIN ) 300 MG capsule Take 1 capsule (300 mg total) by mouth 2 (two) times daily. 60 capsule 5 06/09/2024 Morning   iron  polysaccharides (FERREX 150) 150 MG capsule Take one capsul every Monday, Wednesday, and Friday   06/09/2024   isosorbide  mononitrate (IMDUR ) 120 MG 24 hr tablet Take 1 tablet (120 mg total) by mouth daily. 30 tablet 11 06/09/2024   lithium  carbonate 150 MG capsule Take 150 mg by mouth 2 (two) times daily.   06/09/2024 Morning   methocarbamol  (ROBAXIN ) 500 MG tablet TAKE 1 TABLET BY MOUTH EVERY 8 HOURS AS NEEDED FOR MUSCLE SPASMS 90 tablet 3 Taking As Needed   metoprolol  succinate (TOPROL -XL) 25 MG 24 hr tablet Take 12.5 mg by mouth daily.   06/09/2024   nitroGLYCERIN  (NITROSTAT ) 0.4 MG SL tablet Place 1 tablet (0.4 mg total) under the tongue every 5 (five) minutes as needed for chest pain (Do not take if blood pressure is low, systolic BP<110).   Taking As Needed   omega-3 acid ethyl esters (LOVAZA ) 1 g capsule TAKE 4 CAPSULES (4 GRAMS TOTAL) BY MOUTHDAILY 120 capsule 5 06/09/2024   oxyCODONE -acetaminophen  (PERCOCET) 10-325 MG tablet TAKE 1 TABLET BY MOUTH EVERY 4 HOURS AS NEEDED FOR PAIN 180 tablet 0 Taking As Needed   pantoprazole  (PROTONIX ) 40 MG tablet TAKE 1 TABLET BY MOUTH 2 TIMES  DAILY (30MINUTES BEFORE A MEAL) 60 tablet 0 06/09/2024   ranolazine  (RANEXA ) 1000 MG SR tablet Take 1 tablet (1,000 mg total) by mouth 2 (two) times daily. 60 tablet 0 Taking   sacubitril -valsartan  (ENTRESTO ) 24-26 MG Take 1 tablet by mouth 2 (two) times daily.   06/09/2024 Morning   sennosides-docusate sodium  (SENOKOT-S) 8.6-50 MG tablet Take 1 tablet by mouth daily as needed for constipation.   Taking As Needed   spironolactone  (ALDACTONE ) 25 MG tablet Take 1 tablet (25 mg total) by mouth daily. 90 tablet 2 06/09/2024   traZODone  (DESYREL ) 150 MG tablet Take 1 tablet (150 mg total) by mouth at bedtime as needed for sleep. 30 tablet 3 06/08/2024   TRINTELLIX  10 MG TABS tablet Take 10 mg by mouth daily.   06/09/2024   Accu-Chek Softclix Lancets lancets Use as instructed to check sugar daily for type 2 diabetes. 100 each 4    Blood Glucose Calibration (ACCU-CHEK GUIDE CONTROL) LIQD Use with blood glucose monitor as directed 1 each 3    Blood Glucose Monitoring Suppl (ACCU-CHEK GUIDE) w/Device KIT Use to check blood sugars as directed 1 kit 0    glucose blood (ACCU-CHEK GUIDE) test strip Use as instructed to check sugar daily for type 2 diabetes. 100 strip 4    metFORMIN  (GLUCOPHAGE -XR) 500 MG 24 hr tablet TAKE 1 TABLET BY MOUTH DAILY WITH EVENING MEAL 60 tablet 1    metoCLOPramide  (REGLAN ) 10 MG tablet Take 1 tablet (10 mg total) by mouth 4 (four) times daily -  before meals and at bedtime for 15 days. (Patient not taking: Reported on 06/09/2024) 60 tablet 0    OXYGEN  Inhale 2 L into the lungs at bedtime as needed (for shortness of breath).      Social History   Socioeconomic History   Marital status: Widowed    Spouse name: Not on file   Number of children:  1   Years of education: Not on file   Highest education level: 10th grade  Occupational History   Occupation: Disabled   Occupation: on Tree surgeon  Tobacco Use   Smoking status: Former    Current packs/day: 0.00    Average packs/day:  3.0 packs/day for 50.0 years (150.0 ttl pk-yrs)    Types: Cigarettes    Start date: 04/23/1963    Quit date: 04/22/2013    Years since quitting: 11.1   Smokeless tobacco: Never   Tobacco comments:    Reports not smoking for approx 8 years.  Vaping Use   Vaping status: Never Used  Substance and Sexual Activity   Alcohol use: No    Comment: remotely quit alcohol use. Hx of heavy alcohol use.   Drug use: Not Currently    Types: Marijuana   Sexual activity: Not Currently  Other Topics Concern   Not on file  Social History Narrative   Pt lives in [place] with wife.  Does not routinely exercise.   Social Drivers of Corporate investment banker Strain: Low Risk  (06/12/2024)   Overall Financial Resource Strain (CARDIA)    Difficulty of Paying Living Expenses: Not hard at all  Food Insecurity: Food Insecurity Present (06/12/2024)   Hunger Vital Sign    Worried About Running Out of Food in the Last Year: Sometimes true    Ran Out of Food in the Last Year: Not on file  Transportation Needs: No Transportation Needs (06/12/2024)   PRAPARE - Administrator, Civil Service (Medical): No    Lack of Transportation (Non-Medical): No  Physical Activity: Insufficiently Active (03/14/2024)   Exercise Vital Sign    Days of Exercise per Week: 3 days    Minutes of Exercise per Session: 20 min  Stress: No Stress Concern Present (03/14/2024)   [employer] of Occupational Health - Occupational Stress Questionnaire    Feeling of Stress : Only a little  Recent Concern: Stress - Stress Concern Present (01/13/2024)   [employer] of Occupational Health - Occupational Stress Questionnaire    Feeling of Stress : To some extent  Social Connections: Socially Isolated (06/10/2024)   Social Connection and Isolation Panel    Frequency of Communication with Friends and Family: More than three times a week    Frequency of Social Gatherings with Friends and Family: Once a week    Attends  Religious Services: Never    Database administrator or Organizations: No    Attends Banker Meetings: Never    Marital Status: Widowed  Intimate Partner Violence: Not At Risk (06/10/2024)   Humiliation, Afraid, Rape, and Kick questionnaire    Fear of Current or Ex-Partner: No    Emotionally Abused: No    Physically Abused: No    Sexually Abused: No    Family History  Problem Relation Age of Onset   Heart attack Mother    Depression Mother    Heart disease Mother    COPD Mother    Hypertension Mother    Heart attack Father    Diabetes Father    Depression Father    Heart disease Father    Cirrhosis Father    Parkinson's disease Brother      Vitals:   06/11/24 2355 06/12/24 0406 06/12/24 0602 06/12/24 0828  BP: 113/82  115/79 108/79  Pulse: (!) 50  72 72  Resp:   17 16  Temp: 97.9 F (36.6 C)  97.7 F (  36.5 C) 97.6 F (36.4 C)  TempSrc:      SpO2: 93%  94% 93%  Weight:  82.8 kg    Height:        PHYSICAL EXAM General: Chronically ill-appearing elderly male, well nourished, in no acute distress. HEENT: Normocephalic and atraumatic. Neck: No JVD.   Lungs: Normal respiratory effort on room air. Clear bilaterally to auscultation. No wheezes, crackles, rhonchi.  Heart: Irregularly, irregular, controlled rate. Normal S1 and S2 without gallops or murmurs.  Abdomen: Non-distended appearing.  Msk: Normal strength and tone for age. Extremities: Warm and well perfused. No clubbing, cyanosis, edema.  Neuro: Alert and oriented X 3. Psych: Answers questions appropriately.   Labs: Basic Metabolic Panel: Recent Labs    06/09/24 2215 06/10/24 0319 06/12/24 0003  NA 139 135  --   K 4.6 4.0 4.6  CL 101 100  --   CO2 28 29  --   GLUCOSE 113* 103*  --   BUN 28* 26*  --   CREATININE 1.12 1.15  --   CALCIUM  9.7 9.4  --   MG  --   --  2.1   Liver Function Tests: Recent Labs    06/09/24 2215  AST 14*  ALT 18  ALKPHOS 48  BILITOT 1.4*  PROT 6.1*  ALBUMIN  3.4*   No results for input(s): LIPASE, AMYLASE in the last 72 hours. CBC: Recent Labs    06/09/24 2025 06/10/24 0319  WBC 8.6 7.3  NEUTROABS 5.7  --   HGB 17.1* 16.5  HCT 52.4* 50.8  MCV 87.3 88.8  PLT 180 188   Cardiac Enzymes: Recent Labs    06/11/24 1939 06/12/24 0003 06/12/24 0832  TROPONINIHS 10 9 9    BNP: Recent Labs    06/10/24 0319  BNP 90.2   D-Dimer: No results for input(s): DDIMER in the last 72 hours. Hemoglobin A1C: No results for input(s): HGBA1C in the last 72 hours. Fasting Lipid Panel: No results for input(s): CHOL, HDL, LDLCALC, TRIG, CHOLHDL, LDLDIRECT in the last 72 hours. Thyroid  Function Tests: No results for input(s): TSH, T4TOTAL, T3FREE, THYROIDAB in the last 72 hours.  Invalid input(s): FREET3 Anemia Panel: No results for input(s): VITAMINB12, FOLATE, FERRITIN, TIBC, IRON , RETICCTPCT in the last 72 hours.   Radiology: ECHOCARDIOGRAM COMPLETE Result Date: 06/10/2024    ECHOCARDIOGRAM REPORT   Patient Name:   Lawrence [name] [facility] Date of Exam: 06/10/2024 Medical Rec #:  [id number]       Height:       67.0 in Accession #:    [id number]      Weight:       178.1 lb Date of Birth:  06-02-1956       BSA:          1.925 m Patient Age:    68 years        BP:           133/89 mmHg Patient Gender: M               HR:           76 bpm. Exam Location:  [facility] Procedure: 2D Echo, Cardiac Doppler, Color Doppler and Intracardiac            Opacification Agent (Both Spectral and Color Flow Doppler were            utilized during procedure). Indications:     Syncope R55  History:  Patient has prior history of Echocardiogram examinations, most                  recent 08/08/2023.  Sonographer:     Thedora Louder RDCS, FASE Referring Phys:  [id number] XILIN NIU Diagnosing Phys: Cara JONETTA Lovelace MD  Sonographer Comments: Technically difficult study due to poor echo windows and suboptimal apical window. Image acquisition challenging  due to patient body habitus and Image acquisition challenging due to respiratory motion. IMPRESSIONS  1. Left ventricular ejection fraction, by estimation, is 45 to 50%. The left ventricle has mildly decreased function. The left ventricle demonstrates global hypokinesis. There is mild concentric left ventricular hypertrophy. Left ventricular diastolic parameters are consistent with Grade I diastolic dysfunction (impaired relaxation).  2. Right ventricular systolic function is normal. The right ventricular size is normal.  3. The mitral valve is normal in structure. No evidence of mitral valve regurgitation.  4. The aortic valve is normal in structure. Aortic valve regurgitation is not visualized. FINDINGS  Left Ventricle: Left ventricular ejection fraction, by estimation, is 45 to 50%. The left ventricle has mildly decreased function. The left ventricle demonstrates global hypokinesis. Definity  contrast agent was given IV to delineate the left ventricular  endocardial borders. Strain was performed and the global longitudinal strain is indeterminate. The left ventricular internal cavity size was normal in size. There is mild concentric left ventricular hypertrophy. Left ventricular diastolic parameters are  consistent with Grade I diastolic dysfunction (impaired relaxation). Right Ventricle: The right ventricular size is normal. No increase in right ventricular wall thickness. Right ventricular systolic function is normal. Left Atrium: Left atrial size was normal in size. Right Atrium: Right atrial size was normal in size. Pericardium: There is no evidence of pericardial effusion. Mitral Valve: The mitral valve is normal in structure. No evidence of mitral valve regurgitation. Tricuspid Valve: The tricuspid valve is normal in structure. Tricuspid valve regurgitation is not demonstrated. Aortic Valve: The aortic valve is normal in structure. Aortic valve regurgitation is not visualized. Aortic regurgitation PHT  measures 747 msec. Aortic valve peak gradient measures 6.2 mmHg. Pulmonic Valve: The pulmonic valve was normal in structure. Pulmonic valve regurgitation is not visualized. Aorta: The ascending aorta was not well visualized. IAS/Shunts: No atrial level shunt detected by color flow Doppler. Additional Comments: 3D was performed not requiring image post processing on an independent workstation and was indeterminate.  LEFT VENTRICLE PLAX 2D LVIDd:         2.80 cm     Diastology LVIDs:         2.20 cm     LV e' medial:    10.80 cm/s LV PW:         1.40 cm     LV E/e' medial:  6.3 LV IVS:        1.30 cm     LV e' lateral:   13.10 cm/s LVOT diam:     2.00 cm     LV E/e' lateral: 5.2 LV SV:         53 LV SV Index:   28 LVOT Area:     3.14 cm  LV Volumes (MOD) LV vol d, MOD A4C: 74.4 ml LV vol s, MOD A4C: 37.1 ml LV SV MOD A4C:     74.4 ml RIGHT VENTRICLE RV Basal diam:  2.90 cm RV S prime:     7.62 cm/s LEFT ATRIUM           Index  RIGHT ATRIUM           Index LA diam:      5.10 cm 2.65 cm/m   RA Area:     13.20 cm LA Vol (A2C): 82.8 ml 43.01 ml/m  RA Volume:   28.10 ml  14.60 ml/m LA Vol (A4C): 47.7 ml 24.78 ml/m  AORTIC VALVE                 PULMONIC VALVE AV Area (Vmax): 2.33 cm     PV Vmax:        0.67 m/s AV Vmax:        124.00 cm/s  PV Peak grad:   1.8 mmHg AV Peak Grad:   6.2 mmHg     RVOT Peak grad: 2 mmHg LVOT Vmax:      91.80 cm/s LVOT Vmean:     60.800 cm/s LVOT VTI:       0.169 m AI PHT:         747 msec  AORTA Ao Root diam: 3.70 cm Ao Asc diam:  3.20 cm MITRAL VALVE MV Area (PHT): 4.29 cm    SHUNTS MV Decel Time: 177 msec    Systemic VTI:  0.17 m MV E velocity: 68.10 cm/s  Systemic Diam: 2.00 cm Cara JONETTA Lovelace MD Electronically signed by Cara JONETTA Lovelace MD Signature Date/Time: 06/10/2024/3:38:28 PM    Final    CT Angio Chest Pulmonary Embolism (PE) W or WO Contrast Result Date: 06/10/2024 CLINICAL DATA:  Syncope/presyncope, cerebrovascular cause suspected EXAM: CT ANGIOGRAPHY CHEST WITH  CONTRAST TECHNIQUE: Multidetector CT imaging of the chest was performed using the standard protocol during bolus administration of intravenous contrast. Multiplanar CT image reconstructions and MIPs were obtained to evaluate the vascular anatomy. RADIATION DOSE REDUCTION: This exam was performed according to the departmental dose-optimization program which includes automated exposure control, adjustment of the mA and/or kV according to patient size and/or use of iterative reconstruction technique. CONTRAST:  OMNIPAQUE  IOHEXOL  350 MG/ML SOLN COMPARISON:  05/27/2024 FINDINGS: Cardiovascular: Adequate opacification of the pulmonary arterial tree. No intraluminal filling defect identified to suggest acute pulmonary embolism. Central pulmonary arteries are mildly enlarged in keeping with changes of pulmonary arterial hypertension. Status post coronary artery bypass grafting. Cardiac size is mildly enlarged, stable. No pericardial effusion. Moderate atherosclerotic calcification within the thoracic aorta. No aortic aneurysm. Mediastinum/Nodes: No enlarged mediastinal, hilar, or axillary lymph nodes. Thyroid  gland, trachea, and esophagus demonstrate no significant findings. Small hiatal hernia Lungs/Pleura: Previously noted extensive pulmonary infiltrate throughout the right lung has significantly improved in the interval with mild residual peribronchial and centrilobular nodular infiltrate. Mild emphysema. Stable bronchial wall thickening in keeping with airway inflammation. Scattered airway impaction noted within right lower lobe. No central obstructing mass. No pneumothorax or pleural effusion. Upper Abdomen: No acute abnormality. Musculoskeletal: No acute bone abnormality. No lytic or blastic bone lesion. Osseous structures are age appropriate. Review of the MIP images confirms the above findings. IMPRESSION: 1. No pulmonary embolism. 2. Mild enlargement of the central pulmonary arteries in keeping with changes  of pulmonary arterial hypertension. 3. Mild cardiomegaly. Status post coronary artery bypass grafting. 4. Improved airspace infiltrate throughout the right lung in keeping a resolving infectious process 5. Emphysema.  Airway inflammation. Aortic Atherosclerosis (ICD10-I70.0) and Emphysema (ICD10-J43.9). Electronically Signed   By: Dorethia Molt M.D.   On: 06/10/2024 02:56   DG Lumbar Spine 2-3 Views Result Date: 06/09/2024 CLINICAL DATA:  Recent fall with low back pain, initial encounter EXAM: LUMBAR  SPINE - 3 VIEW COMPARISON:  03/28/2024 FINDINGS: Five lumbar type vertebral bodies are well visualized. Vertebral body height is well maintained. Mild osteophytic changes are seen. Degenerative change with mild anterolisthesis of L4 on L5 is again noted. Aortic calcifications are seen. IMPRESSION: Multilevel degenerative change without acute abnormality. Electronically Signed   By: Oneil Devonshire M.D.   On: 06/09/2024 19:29   DG Chest 2 View Result Date: 06/09/2024 CLINICAL DATA:  Dizziness and chest pain EXAM: CHEST - 2 VIEW COMPARISON:  05/29/2024 FINDINGS: Cardiac shadow is stable. Postsurgical changes are again noted. Aortic calcifications are seen. The lungs are well aerated bilaterally. No focal infiltrate or effusion is seen. No bony abnormality is noted. IMPRESSION: No active cardiopulmonary disease. Electronically Signed   By: Oneil Devonshire M.D.   On: 06/09/2024 19:28   CT Head Wo Contrast Result Date: 06/09/2024 CLINICAL DATA:  Dizziness fall LOC EXAM: CT HEAD WITHOUT CONTRAST CT CERVICAL SPINE WITHOUT CONTRAST TECHNIQUE: Multidetector CT imaging of the head and cervical spine was performed following the standard protocol without intravenous contrast. Multiplanar CT image reconstructions of the cervical spine were also generated. RADIATION DOSE REDUCTION: This exam was performed according to the departmental dose-optimization program which includes automated exposure control, adjustment of the mA and/or  kV according to patient size and/or use of iterative reconstruction technique. COMPARISON:  CT brain and cervical spine 05/27/2024 FINDINGS: CT HEAD FINDINGS Brain: No acute territorial infarction, hemorrhage or intracranial mass. The ventricles are nonenlarged Vascular: No hyperdense vessel or unexpected calcification. Skull: Normal. Negative for fracture or focal lesion. Sinuses/Orbits: No acute finding. Other: None CT CERVICAL SPINE FINDINGS Alignment: Straightening of the cervical spine. No subluxation. Facet alignment is normal Skull base and vertebrae: No fracture. Chronic lucent lesion at C3, likely benign. Soft tissues and spinal canal: No prevertebral fluid or swelling. No visible canal hematoma. Disc levels: No significant canal stenosis or foraminal narrowing. Minimal disc space narrowing C5-C6 and C6-C7 Upper chest: Negative. Other: None IMPRESSION: Negative non contrasted CT appearance of the brain. Straightening of the cervical spine with minimal degenerative change. No acute osseous abnormality. Electronically Signed   By: Luke Bun M.D.   On: 06/09/2024 19:19   CT Cervical Spine Wo Contrast Result Date: 06/09/2024 CLINICAL DATA:  Dizziness fall LOC EXAM: CT HEAD WITHOUT CONTRAST CT CERVICAL SPINE WITHOUT CONTRAST TECHNIQUE: Multidetector CT imaging of the head and cervical spine was performed following the standard protocol without intravenous contrast. Multiplanar CT image reconstructions of the cervical spine were also generated. RADIATION DOSE REDUCTION: This exam was performed according to the departmental dose-optimization program which includes automated exposure control, adjustment of the mA and/or kV according to patient size and/or use of iterative reconstruction technique. COMPARISON:  CT brain and cervical spine 05/27/2024 FINDINGS: CT HEAD FINDINGS Brain: No acute territorial infarction, hemorrhage or intracranial mass. The ventricles are nonenlarged Vascular: No hyperdense vessel  or unexpected calcification. Skull: Normal. Negative for fracture or focal lesion. Sinuses/Orbits: No acute finding. Other: None CT CERVICAL SPINE FINDINGS Alignment: Straightening of the cervical spine. No subluxation. Facet alignment is normal Skull base and vertebrae: No fracture. Chronic lucent lesion at C3, likely benign. Soft tissues and spinal canal: No prevertebral fluid or swelling. No visible canal hematoma. Disc levels: No significant canal stenosis or foraminal narrowing. Minimal disc space narrowing C5-C6 and C6-C7 Upper chest: Negative. Other: None IMPRESSION: Negative non contrasted CT appearance of the brain. Straightening of the cervical spine with minimal degenerative change. No acute osseous abnormality. Electronically Signed  By: Luke Bun M.D.   On: 06/09/2024 19:19   CT ABDOMEN PELVIS W CONTRAST Result Date: 05/29/2024 CLINICAL DATA:  Abdominal pain, emesis, hematemesis EXAM: CT ABDOMEN AND PELVIS WITH CONTRAST TECHNIQUE: Multidetector CT imaging of the abdomen and pelvis was performed using the standard protocol following bolus administration of intravenous contrast. RADIATION DOSE REDUCTION: This exam was performed according to the departmental dose-optimization program which includes automated exposure control, adjustment of the mA and/or kV according to patient size and/or use of iterative reconstruction technique. CONTRAST:  OMNIPAQUE  IOHEXOL  300 MG/ML  SOLN COMPARISON:  May 27, 2024 CTA FINDINGS: lower chest: Redemonstration of opacities at the visualized right lung base. Hepatobiliary: No suspicious liver lesions. Status post cholecystectomy. No biliary ductal dilation. Pancreas: Unremarkable. Spleen: Unremarkable. Adrenals/Urinary Tract: Unchanged mild bilateral adrenal gland nodularity. No nephrolithiasis or hydronephrosis. Stomach/Bowel: No evidence of bowel obstruction or inflammation. Appendix is unremarkable. Small hiatal hernia. Vascular/Lymphatic: Normal caliber  aorta. No lymphadenopathy by size criteria. Reproductive: Unremarkable. Other: No free air or free fluid. Musculoskeletal: No acute osseous findings. IMPRESSION: No acute pathology in the abdomen or pelvis. Redemonstration of likely pneumonia involving the partially imaged right lower lung. Electronically Signed   By: Michaeline Blanch M.D.   On: 05/29/2024 12:37   DG Chest Portable 1 View Result Date: 05/29/2024 CLINICAL DATA:  COPD.  Recent pneumonia.  Shortness of breath EXAM: PORTABLE CHEST 1 VIEW COMPARISON:  X-ray 05/27/2024. FINDINGS: Hyperinflation. Chronic interstitial changes. Normal cardiopericardial silhouette with sternal wires. Coronary artery stents are suggested. Calcifications towards the mitral valve annulus. On the prior there is some hazy ill-defined opacity right upper lung which show some improvement with some residual. Recommend continued follow-up. No pneumothorax or effusion. No edema. IMPRESSION: Slight decrease in the hazy opacity right upper lung with some residual. Recommend continued follow-up to confirm resolution. Electronically Signed   By: Ranell Bring M.D.   On: 05/29/2024 10:14   CT Angio Chest/Abd/Pel for Dissection W and/or Wo Contrast Result Date: 05/27/2024 CLINICAL DATA:  Acute aortic syndrome suspected. Chest pain following fall. EXAM: CT ANGIOGRAPHY CHEST, ABDOMEN AND PELVIS TECHNIQUE: Non-contrast CT of the chest was initially obtained. Multidetector CT imaging through the chest, abdomen and pelvis was performed using the standard protocol during bolus administration of intravenous contrast. Multiplanar reconstructed images and MIPs were obtained and reviewed to evaluate the vascular anatomy. RADIATION DOSE REDUCTION: This exam was performed according to the departmental dose-optimization program which includes automated exposure control, adjustment of the mA and/or kV according to patient size and/or use of iterative reconstruction technique. CONTRAST:  OMNIPAQUE   IOHEXOL  350 MG/ML SOLN COMPARISON:  03/17/2024. FINDINGS: CTA CHEST FINDINGS Cardiovascular: The heart is stable in size and multi-vessel coronary artery calcifications are noted. There is atherosclerotic calcification of the aorta without evidence of aneurysm. No dissection or intramural hematoma is seen. The pulmonary trunk is mildly distended suggesting underlying pulmonary artery hypertension. Mediastinum/Nodes: A prominent lymph node is noted in the subcarinal space measuring up to 1.1 cm. Prominent lymph nodes are present at the right hilum measuring up to 1 cm. No axillary lymphadenopathy is seen. The thyroid  gland, trachea, and esophagus are within normal limits. There is a small hiatal hernia. Lungs/Pleura: Scattered airspace opacities are noted in the right lung with mild consolidation in the right upper lobe. No pleural effusion or pneumothorax is seen. Musculoskeletal: Sternotomy wires are noted. Degenerative changes are present in the thoracic spine. No acute fracture is seen. Review of the MIP images confirms the above  findings. CTA ABDOMEN AND PELVIS FINDINGS VASCULAR Aorta: Normal caliber aorta without aneurysm, dissection, vasculitis or significant stenosis. Aortic atherosclerosis. Celiac: Patent without evidence of aneurysm, dissection, vasculitis or significant stenosis. SMA: Patent without evidence of aneurysm, dissection, vasculitis or significant stenosis. Renals: Both renal arteries are patent without evidence of aneurysm, dissection, vasculitis, fibromuscular dysplasia or significant stenosis. IMA: Patent without evidence of aneurysm, dissection, vasculitis or significant stenosis. Inflow: Patent without evidence of aneurysm, dissection, vasculitis or significant stenosis. Veins: No obvious venous abnormality within the limitations of this arterial phase study. Review of the MIP images confirms the above findings. NON-VASCULAR Hepatobiliary: No focal liver abnormality is seen. Status post  cholecystectomy. No biliary dilatation. Pancreas: Unremarkable. No pancreatic ductal dilatation or surrounding inflammatory changes. Spleen: Normal in size without focal abnormality. Adrenals/Urinary Tract: The adrenal glands are within normal limits. The kidneys enhance symmetrically. No renal calculus or hydronephrosis bilaterally. There is diffuse bladder wall thickening. Stomach/Bowel: There is a small hiatal hernia. The stomach is otherwise within normal limits. No bowel obstruction, free air, or pneumatosis. Scattered diverticula are present along the colon without evidence of diverticulitis. Appendix appears normal. Lymphatic: No abdominal or pelvic lymphadenopathy. Reproductive: Prostate is unremarkable. Other: No abdominopelvic ascites. A fat containing inguinal hernia is noted on the left. Musculoskeletal: Degenerative changes are present in the lumbar spine. No acute fracture is seen. Review of the MIP images confirms the above findings. IMPRESSION: 1. Aortic atherosclerosis with no evidence of aortic aneurysm or dissection. 2. Scattered airspace opacities in the right lung, suspicious for pneumonia. 3. Diffuse bladder wall thickening, possible infectious or inflammatory cystitis. 4. Diverticulosis without diverticulitis. 5. Coronary artery calcifications. Electronically Signed   By: Leita Birmingham M.D.   On: 05/27/2024 16:08   CT Maxillofacial Wo Contrast Result Date: 05/27/2024 CLINICAL DATA:  Facial trauma, blunt EXAM: CT MAXILLOFACIAL WITHOUT CONTRAST TECHNIQUE: Multidetector CT imaging of the maxillofacial structures was performed. Multiplanar CT image reconstructions were also generated. RADIATION DOSE REDUCTION: This exam was performed according to the departmental dose-optimization program which includes automated exposure control, adjustment of the mA and/or kV according to patient size and/or use of iterative reconstruction technique. COMPARISON:  CT the head dated Mar 29, 2024. FINDINGS:  Osseous: The facial bones are intact. The patient is edentulous. No bony lesions are present. Orbits: Normal orbits. Sinuses: Minimal mucosal disease within the maxillary and ethmoid sinuses and floor left frontal sinus. Soft tissues: Unremarkable. Limited intracranial: Negative. IMPRESSION: 1. Mild paranasal sinuses disease. No evidence of acute traumatic injury. Electronically Signed   By: Evalene Coho M.D.   On: 05/27/2024 15:53   CT HEAD WO CONTRAST ( ) Result Date: 05/27/2024 CLINICAL DATA:  Headache, new onset (Age >= 51y) EXAM: CT HEAD WITHOUT CONTRAST TECHNIQUE: Contiguous axial images were obtained from the base of the skull through the vertex without intravenous contrast. RADIATION DOSE REDUCTION: This exam was performed according to the departmental dose-optimization program which includes automated exposure control, adjustment of the mA and/or kV according to patient size and/or use of iterative reconstruction technique. COMPARISON:  CT the head dated Mar 29, 2024. FINDINGS: Brain: Normal brain. No evidence of hemorrhage, mass, cortical infarct or hydrocephalus. Vascular: Negative. Skull: Negative. Sinuses/Orbits: Minimal mucosal disease within the maxillary sinuses. Normal orbits. Other: None. IMPRESSION: Normal. Electronically Signed   By: Evalene Coho M.D.   On: 05/27/2024 15:51   CT Cervical Spine Wo Contrast Result Date: 05/27/2024 CLINICAL DATA:  Bone lesion, cervical spine, incidental EXAM: CT CERVICAL SPINE WITHOUT CONTRAST TECHNIQUE: Multidetector  CT imaging of the cervical spine was performed without intravenous contrast. Multiplanar CT image reconstructions were also generated. RADIATION DOSE REDUCTION: This exam was performed according to the departmental dose-optimization program which includes automated exposure control, adjustment of the mA and/or kV according to patient size and/or use of iterative reconstruction technique. COMPARISON:  CT of the cervical spine dated Mar 29, 2024. FINDINGS: Alignment: Normal. Skull base and vertebrae: Benign lucency within the left lateral aspect of the C3 vertebral body, likely representing cyst or hemangioma. Soft tissues and spinal canal: No paraspinous soft tissue swelling or injury evident. Disc levels: The disc spaces are satisfactory preserved and the spinal canal and neural foramina appear patent. Upper chest: Negative. Other: None. IMPRESSION: 1. Highly likely benign lucent lesion within the C3 vertebral body. Otherwise, negative. Electronically Signed   By: Evalene Coho M.D.   On: 05/27/2024 15:50   DG Chest 2 View Result Date: 05/27/2024 CLINICAL DATA:  Chest pain following fall. EXAM: CHEST - 2 VIEW; RIGHT SHOULDER - 2+ VIEW COMPARISON:  05/18/2024. FINDINGS: Chest: The heart size and mediastinal contours are within normal limits. There is atherosclerotic calcification of the aorta. Scattered airspace opacities are noted in the right lung. No effusion or pneumothorax is seen. Sternotomy wires are present over the midline. Degenerative changes are noted in the thoracic spine. No acute osseous abnormality. Right shoulder: There is no evidence of acute fracture or dislocation. Moderate degenerative changes are noted at the acromioclavicular joint. The humeral head is high riding which may be associated with chronic rotator cuff tear. IMPRESSION: 1. Patchy airspace disease in the right lung, concerning for pneumonia. 2. No acute fracture or dislocation at the right shoulder. 3. Moderate degenerative changes at the glenohumeral and acromioclavicular joints. 4. High riding humeral head on the right which may be associated with chronic rotator cuff tear. Electronically Signed   By: Leita Birmingham M.D.   On: 05/27/2024 14:40   DG Shoulder Right Result Date: 05/27/2024 CLINICAL DATA:  Chest pain following fall. EXAM: CHEST - 2 VIEW; RIGHT SHOULDER - 2+ VIEW COMPARISON:  05/18/2024. FINDINGS: Chest: The heart size and mediastinal  contours are within normal limits. There is atherosclerotic calcification of the aorta. Scattered airspace opacities are noted in the right lung. No effusion or pneumothorax is seen. Sternotomy wires are present over the midline. Degenerative changes are noted in the thoracic spine. No acute osseous abnormality. Right shoulder: There is no evidence of acute fracture or dislocation. Moderate degenerative changes are noted at the acromioclavicular joint. The humeral head is high riding which may be associated with chronic rotator cuff tear. IMPRESSION: 1. Patchy airspace disease in the right lung, concerning for pneumonia. 2. No acute fracture or dislocation at the right shoulder. 3. Moderate degenerative changes at the glenohumeral and acromioclavicular joints. 4. High riding humeral head on the right which may be associated with chronic rotator cuff tear. Electronically Signed   By: Leita Birmingham M.D.   On: 05/27/2024 14:40   CT Angio Chest PE W/Cm &/Or Wo Cm Result Date: 05/19/2024 CLINICAL DATA:  Right lower leg pain. Brought in by EMS from home placed on 2 L of oxygen  at night. PE suspected. Chest pain. EXAM: CT ANGIOGRAPHY CHEST WITH CONTRAST TECHNIQUE: Multidetector CT imaging of the chest was performed using the standard protocol during bolus administration of intravenous contrast. Multiplanar CT image reconstructions and MIPs were obtained to evaluate the vascular anatomy. RADIATION DOSE REDUCTION: This exam was performed according to the departmental dose-optimization program  which includes automated exposure control, adjustment of the mA and/or kV according to patient size and/or use of iterative reconstruction technique. CONTRAST:  75mL OMNIPAQUE  IOHEXOL  350 MG/ML SOLN COMPARISON:  Radiographs 05/18/2024 and CT 03/17/2024 FINDINGS: Cardiovascular: Negative for pulmonary embolism. Advanced coronary artery calcification. Aortic atherosclerotic calcification. No pericardial effusion. Sternotomy and CABG.  Mediastinum/Nodes: Trachea is unremarkable. Unremarkable esophagus. No thoracic adenopathy. Lungs/Pleura: Mild diffuse bronchial wall thickening. Scattered centrilobular micro nodules in the right upper lobe. No focal consolidation, pleural effusion, or pneumothorax. Upper Abdomen: No acute abnormality. Musculoskeletal: No acute fracture. Review of the MIP images confirms the above findings. IMPRESSION: 1. Negative for pulmonary embolism. 2. Bronchial wall thickening and scattered centrilobular micro nodules compatible with airway infection/inflammation. 3. Advanced coronary artery calcification. 4. Aortic Atherosclerosis (ICD10-I70.0). Electronically Signed   By: Norman Gatlin M.D.   On: 05/19/2024 01:32   US  Venous Img Lower Unilateral Right Result Date: 05/19/2024 CLINICAL DATA:  Right leg pain. EXAM: RIGHT LOWER EXTREMITY VENOUS DOPPLER ULTRASOUND TECHNIQUE: Gray-scale sonography with graded compression, as well as color Doppler and duplex ultrasound were performed to evaluate the lower extremity deep venous systems from the level of the common femoral vein and including the common femoral, femoral, profunda femoral, popliteal and calf veins including the posterior tibial, peroneal and gastrocnemius veins when visible. The superficial great saphenous vein was also interrogated. Spectral Doppler was utilized to evaluate flow at rest and with distal augmentation maneuvers in the common femoral, femoral and popliteal veins. COMPARISON:  None Available. FINDINGS: Contralateral Common Femoral Vein: Respiratory phasicity is normal and symmetric with the symptomatic side. No evidence of thrombus. Normal compressibility. Common Femoral Vein: No evidence of thrombus. Normal compressibility, respiratory phasicity and response to augmentation. Saphenofemoral Junction: No evidence of thrombus. Normal compressibility and flow on color Doppler imaging. Profunda Femoral Vein: No evidence of thrombus. Normal compressibility  and flow on color Doppler imaging. Femoral Vein: No evidence of thrombus. Normal compressibility, respiratory phasicity and response to augmentation. Popliteal Vein: No evidence of thrombus. Normal compressibility, respiratory phasicity and response to augmentation. Calf Veins: No evidence of thrombus. Normal compressibility and flow on color Doppler imaging. Superficial Great Saphenous Vein: No evidence of thrombus. Normal compressibility. Venous Reflux:  None. Other Findings:  None. IMPRESSION: No evidence of deep venous thrombosis of the RIGHT lower extremity. Electronically Signed   By: Suzen Dials M.D.   On: 05/19/2024 00:37   DG Chest 1 View Result Date: 05/19/2024 CLINICAL DATA:  Chest pain EXAM: CHEST  1 VIEW COMPARISON:  03/29/2024 FINDINGS: Sternotomy and CABG. Stable cardiomediastinal silhouette. Coronary stenting. Bronchial wall thickening suspicious for bronchitis. Left basilar atelectasis. Otherwise no focal consolidation. No pleural effusion or pneumothorax. No displaced rib fractures. IMPRESSION: 1. Bronchial wall thickening suspicious for bronchitis. Electronically Signed   By: Norman Gatlin M.D.   On: 05/19/2024 00:13   DG Tibia/Fibula Right Result Date: 05/18/2024 CLINICAL DATA:  Right lower leg pain EXAM: RIGHT TIBIA AND FIBULA - 2 VIEW COMPARISON:  None Available. FINDINGS: No acute fracture or dislocation. There is no evidence of arthropathy or other focal bone abnormality. Soft tissues are unremarkable. Peripheral vascular atherosclerosis. IMPRESSION: No acute fracture or dislocation. Electronically Signed   By: Rogelia Myers M.D.   On: 05/18/2024 22:34    ECHO as above  TELEMETRY reviewed by me 06/12/2024: atrial fibrillation, rate 60-70s  EKG reviewed by me: atrial fibrillation, rate 80 bpm  Data reviewed by me 06/12/2024: last 24h vitals tele labs imaging I/O ED provider note, admission  H&P.  Principal Problem:   Syncope Active Problems:   HLD (hyperlipidemia)    Chronic combined systolic (congestive) and diastolic (congestive) heart failure (HCC)   HTN (hypertension)   CAD (coronary artery disease)   Chest pain   Overweight (BMI 25.0-29.9)   Atrial fibrillation, chronic (HCC)   COPD (chronic obstructive pulmonary disease) (HCC)   Anxiety and depression   Bipolar disorder (HCC)    ASSESSMENT AND PLAN:  Lawrence [name] is a 68 y.o. male  with a past medical history of coronary artery disease s/p CABG, multiple stents, ischemic cardiomyopathy, chronic HFrEF (improved EF 30% to 50%), hypertension, hyperlipidemia, paroxsymal atrial fibrillation, OSA, COPD (on 2L O2), type 2 diabetes mellitus who presented to the ED on 06/09/2024 for syncope and chest pain. Prior to syncope event patient felt dizzy/lightheaded. Patient's home cardiac medications held due to orthostatic hypotension. Cardiology was consulted for further evaluation of cardiac medication regimen in setting of orthostasis.   # Coronary artery disease s/p CABG, multiple stents # Hyperlipidemia # Orthostatic hypotension, Syncope EKG without acute ischemic changes. Trops negative x2. Patient reports history of orthostatic hypotension and syncope episodes related due to BP. Cardiac monitor on 08/16/23 that showed average HR 63, predominately sinus rhythm, 24% fibrillation burden, no other significant arrhythmias. Patient denies chest pain. -Continue atorvastatin  80 mg daily. -Continue home Imdur  120 mg daily. -Continue home Ranexa  1000 mg BID.  -Continue midodrine  2.5 mg TID. Wean when able. MAP > 65. -Metoprolol  succinate as stated below. -Consider repeat cardiac monitor with primary Cardiology at [facility].   # Chronic HFrEF (improved EF 30% - 50%) # Ischemic cardiomyopathy Patient with hx of reduced EF 30-35% (07/2023), EF improved. Echo this admission with EF 45-50%, global hypokinesis, mild concentric LVH, grade I diastolic dysfunction. Patient appears hypovolemic.  -Transition Coreg  to  metoprolol  succinate 25 mg daily. (Coreg  blocks alpha 1 receptor that is agonized by midodrine ) -Home dapagliflozin , spironolactone , lasix  held due to orthostatic hypotension.  Recomend to resume GDMT at outpatient cardiology follow-up as BP improves.  # Paroxsymal atrial fibrillation  EKG and per tele rate controlled atrial fibrillation -Continue home Eliquis  5 mg BID for stroke risk reduction. -Metoprolol  as stated above.  This patient's plan of care was discussed and created with Dr. Wilburn and he is in agreement.  Signed: Dorene Comfort, PA-C  06/12/2024, 11:25 AM [facility]

## 2024-06-12 NOTE — Progress Notes (Signed)
 PROGRESS NOTE    Lawrence [name]   FMW:[id number] DOB: 1955-11-26  DOA: 06/09/2024 Date of Service: 06/12/24 which is hospital day 0  PCP: [name], MD    Hospital course / significant events:   HPI: Lawrence [name] is a 68 y.o. male with medical history significant of f CAD (s/p of stent x 4 and CABG 2016), HFrEF with EF 30-35%, a fib on Eliquis , PSVT,  HTN, HLD, DM, COPD on 2L O2, OSA not using CPAP, dCHF, anxiety, celiac disease, pancreatitis, who presents with syncope and chest pain -  Patient states that he started feeling dizzy and lightheaded 07/25 around 8:00 PM, then passed out briefly. He reports chest pain, which is located in the front chest, constant, sharp, pleuritic, 9 out of 10 in severity, nonradiating. Associated with mild SOB and mild dry cough.   recently hospitalized from 7/14 - 7/17 due to pneumonia and COPD exacerbation.   07/25: to ED. Troponin 12. Imaging neg. Admitted to hospitalist.  07/26: (+)orthostatics, Echo shows improved EF from prior, still having dizziness and unsteadiness, will hold entresto  (as well as previously held meds) and see how he is doing tomorrow  07/27: still dizzy but some improved from yesterday. Recurrent chest pain this afternoon, repeat ACS w/u. BP soft, starting midodrine  - suspect angina / COPD but if ACS r/o and symptoms persist will engage cardiology team  07/28: ACS r/o, persistent orthostatic hypotension, cardiology consulted - continue statin, imdur  120, ranexa , midodrine  2.5 mg tid wean as able, metoprolol  transitioned to succinate 25 mg daily, and continue hold dapagliflozin , spironolactone , lasix   restart outpatient as able.    Consultants:  Cardiology  Procedures/Surgeries: none      ASSESSMENT & PLAN:   Syncope Etiology is not clear but suspect orthostatic/vasovagal even (though orthostatics here initially negative, were positive today), possible Afib RVR then self-converted. Suspect he has concussion also  complicating dizziness with position changes   CT head negative for acute intracranial abnormalities.   No focal neurodeficit on physical examination, low suspicions for stroke.   CHF appears compensated PE ruled out  No infection  Troponins neg/flat no ACS Orthostatics positive  Echo is showing improved function compared to alst available echo 07/2023 - EF 45-50% (improved from 30-35% w/ global hypokinesis), grade 1 diastolic dysfunction  fall precaution IVF: NS 50 cc/h PT/OT eval and treat   Chest pain / angina CAD Cardiology consult  Unable to administer nitroglycerin  d/t low BP continue statin, imdur  120, ranexa , midodrine  2.5 mg tid wean as able, metoprolol  transitioned to succinate 25 mg daily, and continue hold dapagliflozin , spironolactone , lasix   restart outpatient as able.   Chronic pain Opiate dependence/tolerance  PDMP reviewed, Oxy-APAP 10-325 #180 x30d = 6 pills per day = 20 mg q8h or 10 mg q4h  Continue home dose  NO further IV pain medications   Paroxysmal Afib Telemetry  Eliquis  Beta blocker - per cardiology ok to resume metoprolol  succinate   Chronic combined systolic (congestive) and diastolic (congestive) heart failure not in acute exacerbation Hold Lasix  and spironolactone  due to syncope Continue entresto  for now, he in on lowest dose    HLD (hyperlipidemia) Lipitor    COPD (chronic obstructive pulmonary disease) (HCC): Stable Breztri  and as needed Mucinex  Albuterol  as needed    HTN (hypertension) Holding amlodipine  and lasix  d/t (+)orthostatics  Imdur , metoprolol   Diabetes Type 2 SSI here    Anxiety and depression Trintellix , trazodone  Xanax  prn    Bipolar disorder (HCC) Continue lithium   GERD  PPI, H2B    overweight based on BMI: Body mass index is 27.9 kg/m.SABRA Significantly low or high BMI is associated with higher medical risk.  Underweight - under 18  overweight - 25 to 29 obese - 30 or more Class 1 obesity: BMI of 30.0 to  34 Class 2 obesity: BMI of 35.0 to 39 Class 3 obesity: BMI of 40.0 to 49 Super Morbid Obesity: BMI 50-59 Super-super Morbid Obesity: BMI 60+ Healthy nutrition and physical activity advised as adjunct to other disease management and risk reduction treatments    DVT prophylaxis: eliquis  IV fluids: no continuous IV fluids  Nutrition: regular diet (can liberalize salt intake some to matc pt's home diet and possibly help w/ low BP but will monitor fluid status closely)  Central lines / other devices: none  Code Status: DNR ACP documentation reviewed:  none on file in [facility]  [facility] needs: home health  Medical barriers to dispo: ambulatory dysfunction / dizziness, he lives alone. Expected medical readiness for discharge tomorrow.              Subjective / Brief ROS:  Pt reports persistent dizziness is about the same]would like to go home tomorrow w/ nephew rather than to SNF Denies new weakness.  Tolerating diet .  Reports no concerns w/ urination/defecation.   Family Communication: none at this time     Objective Findings:  Vitals:   06/12/24 0602 06/12/24 0828 06/12/24 1154 06/12/24 1545  BP: 115/79 108/79 126/89 102/67  Pulse: 72 72 79 78  Resp: 17 16    Temp: 97.7 F (36.5 C) 97.6 F (36.4 C) 97.7 F (36.5 C) 97.7 F (36.5 C)  TempSrc:      SpO2: 94% 93% 97% 96%  Weight:      Height:        Intake/Output Summary (Last 24 hours) at 06/12/2024 1732 Last data filed at 06/12/2024 1343 Gross per 24 hour  Intake 720 ml  Output 2000 ml  Net -1280 ml   Filed Weights   06/10/24 0459 06/11/24 0410 06/12/24 0406  Weight: 80.8 kg 79.9 kg 82.8 kg    Examination:  Physical Exam Constitutional:      General: He is not in acute distress. Cardiovascular:     Rate and Rhythm: Normal rate and regular rhythm.  Pulmonary:     Effort: Pulmonary effort is normal.     Breath sounds: Wheezing (faint) present. No rales.  Musculoskeletal:     Right lower leg: No edema.      Left lower leg: No edema.  Skin:    General: Skin is warm and dry.  Neurological:     Mental Status: He is alert and oriented to person, place, and time. Mental status is at baseline.  Psychiatric:        Mood and Affect: Mood normal.        Behavior: Behavior normal.          Scheduled Medications:   allopurinol   300 mg Oral BID   apixaban   5 mg Oral BID   atorvastatin   80 mg Oral QHS   budesonide -glycopyrrolate -formoterol   2 puff Inhalation BID   cyanocobalamin   1,000 mcg Oral Daily   famotidine   20 mg Oral BID   feeding supplement  237 mL Oral BID BM   insulin  aspart  0-5 Units Subcutaneous QHS   insulin  aspart  0-9 Units Subcutaneous TID WC   iron  polysaccharides  150 mg Oral Once per day on Monday Wednesday Friday  isosorbide  mononitrate  120 mg Oral Daily   lithium  carbonate  150 mg Oral BID   [START ON 06/13/2024] metoprolol  succinate  25 mg Oral Daily   midodrine   2.5 mg Oral TID WC   omega-3 acid ethyl esters  1 g Oral Daily   pantoprazole   40 mg Oral BID   ranolazine   1,000 mg Oral BID   vortioxetine  HBr  10 mg Oral Daily    Continuous Infusions:    PRN Medications:  albuterol , ALPRAZolam , alum & mag hydroxide-simeth, dextromethorphan -guaiFENesin , methocarbamol , nitroGLYCERIN , ondansetron  (ZOFRAN ) IV, mouth rinse, oxyCODONE , senna-docusate, traZODone   Antimicrobials from admission:  Anti-infectives (From admission, onward)    None           Data Reviewed:  I have personally reviewed the following...  CBC: Recent Labs  Lab 06/09/24 2025 06/10/24 0319  WBC 8.6 7.3  NEUTROABS 5.7  --   HGB 17.1* 16.5  HCT 52.4* 50.8  MCV 87.3 88.8  PLT 180 188   Basic Metabolic Panel: Recent Labs  Lab 06/09/24 2215 06/10/24 0319 06/12/24 0003  NA 139 135  --   K 4.6 4.0 4.6  CL 101 100  --   CO2 28 29  --   GLUCOSE 113* 103*  --   BUN 28* 26*  --   CREATININE 1.12 1.15  --   CALCIUM  9.7 9.4  --   MG  --   --  2.1   GFR: Estimated  Creatinine Clearance: 61.5 mL/min (by C-G formula based on SCr of 1.15 mg/dL). Liver Function Tests: Recent Labs  Lab 06/09/24 2215  AST 14*  ALT 18  ALKPHOS 48  BILITOT 1.4*  PROT 6.1*  ALBUMIN 3.4*   No results for input(s): LIPASE, AMYLASE in the last 168 hours. No results for input(s): AMMONIA in the last 168 hours. Coagulation Profile: No results for input(s): INR, PROTIME in the last 168 hours. Cardiac Enzymes: No results for input(s): CKTOTAL, CKMB, CKMBINDEX, TROPONINI in the last 168 hours. BNP (last 3 results) No results for input(s): PROBNP in the last 8760 hours. HbA1C: No results for input(s): HGBA1C in the last 72 hours. CBG: Recent Labs  Lab 06/11/24 1634 06/11/24 2005 06/12/24 0825 06/12/24 1145 06/12/24 1542  GLUCAP 115* 170* 130* 174* 176*   Lipid Profile: No results for input(s): CHOL, HDL, LDLCALC, TRIG, CHOLHDL, LDLDIRECT in the last 72 hours. Thyroid  Function Tests: No results for input(s): TSH, T4TOTAL, FREET4, T3FREE, THYROIDAB in the last 72 hours. Anemia Panel: No results for input(s): VITAMINB12, FOLATE, FERRITIN, TIBC, IRON , RETICCTPCT in the last 72 hours. Most Recent Urinalysis On File:     Component Value Date/Time   COLORURINE YELLOW (A) 05/29/2024 0942   APPEARANCEUR CLEAR (A) 05/29/2024 0942   APPEARANCEUR Clear 05/25/2019 1554   LABSPEC 1.013 05/29/2024 0942   LABSPEC 1.012 03/07/2015 2143   PHURINE 7.0 05/29/2024 0942   GLUCOSEU >=500 (A) 05/29/2024 0942   GLUCOSEU Negative 03/07/2015 2143   HGBUR SMALL (A) 05/29/2024 0942   BILIRUBINUR NEGATIVE 05/29/2024 0942   BILIRUBINUR Negative 05/25/2019 1554   BILIRUBINUR Negative 03/07/2015 2143   KETONESUR 20 (A) 05/29/2024 0942   PROTEINUR NEGATIVE 05/29/2024 0942   UROBILINOGEN 0.2 03/26/2017 0907   NITRITE NEGATIVE 05/29/2024 0942   LEUKOCYTESUR NEGATIVE 05/29/2024 0942   LEUKOCYTESUR Negative 03/07/2015 2143   Sepsis  Labs: @LABRCNTIP (procalcitonin:4,lacticidven:4) Microbiology: No results found for this or any previous visit (from the past 240 hours).    Radiology Studies last 3 days: ECHOCARDIOGRAM COMPLETE Result  Date: 06/10/2024    ECHOCARDIOGRAM REPORT   Patient Name:   Lawrence [name] [facility] Date of Exam: 06/10/2024 Medical Rec #:  [id number]       Height:       67.0 in Accession #:    [id number]      Weight:       178.1 lb Date of Birth:  1955/12/25       BSA:          1.925 m Patient Age:    68 years        BP:           133/89 mmHg Patient Gender: M               HR:           76 bpm. Exam Location:  [facility] Procedure: 2D Echo, Cardiac Doppler, Color Doppler and Intracardiac            Opacification Agent (Both Spectral and Color Flow Doppler were            utilized during procedure). Indications:     Syncope R55  History:         Patient has prior history of Echocardiogram examinations, most                  recent 08/08/2023.  Sonographer:     Thedora Louder RDCS, FASE Referring Phys:  [id number] XILIN NIU Diagnosing Phys: Cara JONETTA Lovelace MD  Sonographer Comments: Technically difficult study due to poor echo windows and suboptimal apical window. Image acquisition challenging due to patient body habitus and Image acquisition challenging due to respiratory motion. IMPRESSIONS  1. Left ventricular ejection fraction, by estimation, is 45 to 50%. The left ventricle has mildly decreased function. The left ventricle demonstrates global hypokinesis. There is mild concentric left ventricular hypertrophy. Left ventricular diastolic parameters are consistent with Grade I diastolic dysfunction (impaired relaxation).  2. Right ventricular systolic function is normal. The right ventricular size is normal.  3. The mitral valve is normal in structure. No evidence of mitral valve regurgitation.  4. The aortic valve is normal in structure. Aortic valve regurgitation is not visualized. FINDINGS  Left Ventricle: Left ventricular ejection  fraction, by estimation, is 45 to 50%. The left ventricle has mildly decreased function. The left ventricle demonstrates global hypokinesis. Definity  contrast agent was given IV to delineate the left ventricular  endocardial borders. Strain was performed and the global longitudinal strain is indeterminate. The left ventricular internal cavity size was normal in size. There is mild concentric left ventricular hypertrophy. Left ventricular diastolic parameters are  consistent with Grade I diastolic dysfunction (impaired relaxation). Right Ventricle: The right ventricular size is normal. No increase in right ventricular wall thickness. Right ventricular systolic function is normal. Left Atrium: Left atrial size was normal in size. Right Atrium: Right atrial size was normal in size. Pericardium: There is no evidence of pericardial effusion. Mitral Valve: The mitral valve is normal in structure. No evidence of mitral valve regurgitation. Tricuspid Valve: The tricuspid valve is normal in structure. Tricuspid valve regurgitation is not demonstrated. Aortic Valve: The aortic valve is normal in structure. Aortic valve regurgitation is not visualized. Aortic regurgitation PHT measures 747 msec. Aortic valve peak gradient measures 6.2 mmHg. Pulmonic Valve: The pulmonic valve was normal in structure. Pulmonic valve regurgitation is not visualized. Aorta: The ascending aorta was not well visualized. IAS/Shunts: No atrial level shunt detected by color flow Doppler. Additional Comments: 3D was performed  not requiring image post processing on an independent workstation and was indeterminate.  LEFT VENTRICLE PLAX 2D LVIDd:         2.80 cm     Diastology LVIDs:         2.20 cm     LV e' medial:    10.80 cm/s LV PW:         1.40 cm     LV E/e' medial:  6.3 LV IVS:        1.30 cm     LV e' lateral:   13.10 cm/s LVOT diam:     2.00 cm     LV E/e' lateral: 5.2 LV SV:         53 LV SV Index:   28 LVOT Area:     3.14 cm  LV Volumes (MOD)  LV vol d, MOD A4C: 74.4 ml LV vol s, MOD A4C: 37.1 ml LV SV MOD A4C:     74.4 ml RIGHT VENTRICLE RV Basal diam:  2.90 cm RV S prime:     7.62 cm/s LEFT ATRIUM           Index        RIGHT ATRIUM           Index LA diam:      5.10 cm 2.65 cm/m   RA Area:     13.20 cm LA Vol (A2C): 82.8 ml 43.01 ml/m  RA Volume:   28.10 ml  14.60 ml/m LA Vol (A4C): 47.7 ml 24.78 ml/m  AORTIC VALVE                 PULMONIC VALVE AV Area (Vmax): 2.33 cm     PV Vmax:        0.67 m/s AV Vmax:        124.00 cm/s  PV Peak grad:   1.8 mmHg AV Peak Grad:   6.2 mmHg     RVOT Peak grad: 2 mmHg LVOT Vmax:      91.80 cm/s LVOT Vmean:     60.800 cm/s LVOT VTI:       0.169 m AI PHT:         747 msec  AORTA Ao Root diam: 3.70 cm Ao Asc diam:  3.20 cm MITRAL VALVE MV Area (PHT): 4.29 cm    SHUNTS MV Decel Time: 177 msec    Systemic VTI:  0.17 m MV E velocity: 68.10 cm/s  Systemic Diam: 2.00 cm Cara JONETTA Lovelace MD Electronically signed by Cara JONETTA Lovelace MD Signature Date/Time: 06/10/2024/3:38:28 PM    Final    CT Angio Chest Pulmonary Embolism (PE) W or WO Contrast Result Date: 06/10/2024 CLINICAL DATA:  Syncope/presyncope, cerebrovascular cause suspected EXAM: CT ANGIOGRAPHY CHEST WITH CONTRAST TECHNIQUE: Multidetector CT imaging of the chest was performed using the standard protocol during bolus administration of intravenous contrast. Multiplanar CT image reconstructions and MIPs were obtained to evaluate the vascular anatomy. RADIATION DOSE REDUCTION: This exam was performed according to the departmental dose-optimization program which includes automated exposure control, adjustment of the mA and/or kV according to patient size and/or use of iterative reconstruction technique. CONTRAST:  OMNIPAQUE  IOHEXOL  350 MG/ML SOLN COMPARISON:  05/27/2024 FINDINGS: Cardiovascular: Adequate opacification of the pulmonary arterial tree. No intraluminal filling defect identified to suggest acute pulmonary embolism. Central pulmonary arteries  are mildly enlarged in keeping with changes of pulmonary arterial hypertension. Status post coronary artery bypass grafting. Cardiac size is mildly enlarged, stable. No pericardial effusion.  Moderate atherosclerotic calcification within the thoracic aorta. No aortic aneurysm. Mediastinum/Nodes: No enlarged mediastinal, hilar, or axillary lymph nodes. Thyroid  gland, trachea, and esophagus demonstrate no significant findings. Small hiatal hernia Lungs/Pleura: Previously noted extensive pulmonary infiltrate throughout the right lung has significantly improved in the interval with mild residual peribronchial and centrilobular nodular infiltrate. Mild emphysema. Stable bronchial wall thickening in keeping with airway inflammation. Scattered airway impaction noted within right lower lobe. No central obstructing mass. No pneumothorax or pleural effusion. Upper Abdomen: No acute abnormality. Musculoskeletal: No acute bone abnormality. No lytic or blastic bone lesion. Osseous structures are age appropriate. Review of the MIP images confirms the above findings. IMPRESSION: 1. No pulmonary embolism. 2. Mild enlargement of the central pulmonary arteries in keeping with changes of pulmonary arterial hypertension. 3. Mild cardiomegaly. Status post coronary artery bypass grafting. 4. Improved airspace infiltrate throughout the right lung in keeping a resolving infectious process 5. Emphysema.  Airway inflammation. Aortic Atherosclerosis (ICD10-I70.0) and Emphysema (ICD10-J43.9). Electronically Signed   By: Dorethia Molt M.D.   On: 06/10/2024 02:56   DG Lumbar Spine 2-3 Views Result Date: 06/09/2024 CLINICAL DATA:  Recent fall with low back pain, initial encounter EXAM: LUMBAR SPINE - 3 VIEW COMPARISON:  03/28/2024 FINDINGS: Five lumbar type vertebral bodies are well visualized. Vertebral body height is well maintained. Mild osteophytic changes are seen. Degenerative change with mild anterolisthesis of L4 on L5 is again noted.  Aortic calcifications are seen. IMPRESSION: Multilevel degenerative change without acute abnormality. Electronically Signed   By: Oneil Devonshire M.D.   On: 06/09/2024 19:29   DG Chest 2 View Result Date: 06/09/2024 CLINICAL DATA:  Dizziness and chest pain EXAM: CHEST - 2 VIEW COMPARISON:  05/29/2024 FINDINGS: Cardiac shadow is stable. Postsurgical changes are again noted. Aortic calcifications are seen. The lungs are well aerated bilaterally. No focal infiltrate or effusion is seen. No bony abnormality is noted. IMPRESSION: No active cardiopulmonary disease. Electronically Signed   By: Oneil Devonshire M.D.   On: 06/09/2024 19:28   CT Head Wo Contrast Result Date: 06/09/2024 CLINICAL DATA:  Dizziness fall LOC EXAM: CT HEAD WITHOUT CONTRAST CT CERVICAL SPINE WITHOUT CONTRAST TECHNIQUE: Multidetector CT imaging of the head and cervical spine was performed following the standard protocol without intravenous contrast. Multiplanar CT image reconstructions of the cervical spine were also generated. RADIATION DOSE REDUCTION: This exam was performed according to the departmental dose-optimization program which includes automated exposure control, adjustment of the mA and/or kV according to patient size and/or use of iterative reconstruction technique. COMPARISON:  CT brain and cervical spine 05/27/2024 FINDINGS: CT HEAD FINDINGS Brain: No acute territorial infarction, hemorrhage or intracranial mass. The ventricles are nonenlarged Vascular: No hyperdense vessel or unexpected calcification. Skull: Normal. Negative for fracture or focal lesion. Sinuses/Orbits: No acute finding. Other: None CT CERVICAL SPINE FINDINGS Alignment: Straightening of the cervical spine. No subluxation. Facet alignment is normal Skull base and vertebrae: No fracture. Chronic lucent lesion at C3, likely benign. Soft tissues and spinal canal: No prevertebral fluid or swelling. No visible canal hematoma. Disc levels: No significant canal stenosis or  foraminal narrowing. Minimal disc space narrowing C5-C6 and C6-C7 Upper chest: Negative. Other: None IMPRESSION: Negative non contrasted CT appearance of the brain. Straightening of the cervical spine with minimal degenerative change. No acute osseous abnormality. Electronically Signed   By: Luke Bun M.D.   On: 06/09/2024 19:19   CT Cervical Spine Wo Contrast Result Date: 06/09/2024 CLINICAL DATA:  Dizziness fall LOC EXAM: CT HEAD  WITHOUT CONTRAST CT CERVICAL SPINE WITHOUT CONTRAST TECHNIQUE: Multidetector CT imaging of the head and cervical spine was performed following the standard protocol without intravenous contrast. Multiplanar CT image reconstructions of the cervical spine were also generated. RADIATION DOSE REDUCTION: This exam was performed according to the departmental dose-optimization program which includes automated exposure control, adjustment of the mA and/or kV according to patient size and/or use of iterative reconstruction technique. COMPARISON:  CT brain and cervical spine 05/27/2024 FINDINGS: CT HEAD FINDINGS Brain: No acute territorial infarction, hemorrhage or intracranial mass. The ventricles are nonenlarged Vascular: No hyperdense vessel or unexpected calcification. Skull: Normal. Negative for fracture or focal lesion. Sinuses/Orbits: No acute finding. Other: None CT CERVICAL SPINE FINDINGS Alignment: Straightening of the cervical spine. No subluxation. Facet alignment is normal Skull base and vertebrae: No fracture. Chronic lucent lesion at C3, likely benign. Soft tissues and spinal canal: No prevertebral fluid or swelling. No visible canal hematoma. Disc levels: No significant canal stenosis or foraminal narrowing. Minimal disc space narrowing C5-C6 and C6-C7 Upper chest: Negative. Other: None IMPRESSION: Negative non contrasted CT appearance of the brain. Straightening of the cervical spine with minimal degenerative change. No acute osseous abnormality. Electronically Signed   By:  Luke Bun M.D.   On: 06/09/2024 19:19         Laneta Blunt, DO [facility] 06/12/2024, 5:32 PM    Dictation software may have been used to generate the above note. Typos may occur and escape review in typed/dictated notes. Please contact Dr Blunt directly for clarity if needed.  Staff may message me via secure chat in Epic  but this may not receive an immediate response,  please page me for urgent matters!  If 7PM-7AM, please contact night coverage [email or web address]

## 2024-06-12 NOTE — Plan of Care (Signed)

## 2024-06-12 NOTE — Progress Notes (Signed)
 Per Dr Marsa, dc IV morphine  order

## 2024-06-12 NOTE — Progress Notes (Signed)
 Occupational Therapy Treatment Patient Details Name: Lawrence [name] MRN: [id number] DOB: May 12, 1956 Today's Date: 06/12/2024   History of present illness 68 y.o. male with medical history significant of f CAD (s/p of stent x 4 and CBAG 2016), sCHF with EF 30-35%, a fib on Eliquis , PSVT,  HTN, HLD, DM, COPD on 2L O2, OSA not using CPAP, dCHF, anxiety, celiac disease, pancreatitis, who presents with syncope and chest pain   OT comments  Upon entering the room, pt supine in bed and agreeable to OT intervention. Pt reports feeling better and wanting to get out of bed today. Pt performing supine >sit with increased time and effort. Pt seated on EOB and does report some dizziness. Pt dons hospital gown to cover buttocks with set up A. Pt stands with min guard and ambulates 40' with RW to bathroom and takes seated rest break. Pt ambulating towards room door another 64' and then reports wanting to return to recliner chair. Pt seated with call bell and all needed items within reach. He does endorse chest pain as therapist is exiting the room and RN and MD notified. Call bell and all needed items within reach upon exiting the room.       If plan is discharge home, recommend the following:  A little help with walking and/or transfers;A little help with bathing/dressing/bathroom;Help with stairs or ramp for entrance;Assistance with cooking/housework;Assist for transportation   Equipment Recommendations  None recommended by OT       Precautions / Restrictions Precautions Precautions: Fall Precaution/Restrictions Comments: orthostatic hypotension       Mobility Bed Mobility Overal bed mobility: Modified Independent             General bed mobility comments: increased time and effort but no physical assistance provided    Transfers Overall transfer level: Needs assistance Equipment used: Rolling walker (2 wheels) Transfers: Sit to/from Stand, Bed to chair/wheelchair/BSC Sit to Stand:  Supervision     Step pivot transfers: Contact guard assist     General transfer comment: SBA for safety for anterior scooting towards EOB and posteriorly in recliner; SBA for safety to perform STS transfers at EOB and recliner with RW. Demonstrates good safety awareness with hand placement without cueing     Balance Overall balance assessment: Needs assistance Sitting-balance support: Feet supported Sitting balance-Leahy Scale: Good     Standing balance support: Bilateral upper extremity supported, Reliant on assistive device for balance Standing balance-Leahy Scale: Fair                             ADL either performed or assessed with clinical judgement   ADL Overall ADL's : Needs assistance/impaired                         Toilet Transfer: Ambulation;Contact guard assist;Rolling walker (2 wheels)                  Extremity/Trunk Assessment Upper Extremity Assessment Upper Extremity Assessment: Generalized weakness   Lower Extremity Assessment Lower Extremity Assessment: Generalized weakness        Vision Patient Visual Report: No change from baseline           Communication Communication Communication: No apparent difficulties   Cognition Arousal: Alert Behavior During Therapy: WFL for tasks assessed/performed Cognition: No apparent impairments  Following commands: Intact        Cueing   Cueing Techniques: Verbal cues             Pertinent Vitals/ Pain       Pain Assessment Pain Assessment: No/denies pain         Frequency  Min 2X/week        Progress Toward Goals  OT Goals(current goals can now be found in the care plan section)  Progress towards OT goals: Progressing toward goals      AM-PAC OT 6 Clicks Daily Activity     Outcome Measure   Help from another person eating meals?: None Help from another person taking care of personal grooming?: None Help from  another person toileting, which includes using toliet, bedpan, or urinal?: A Little Help from another person bathing (including washing, rinsing, drying)?: A Little Help from another person to put on and taking off regular upper body clothing?: None Help from another person to put on and taking off regular lower body clothing?: A Little 6 Click Score: 21    End of Session Equipment Utilized During Treatment: Rolling walker (2 wheels)  OT Visit Diagnosis: Unsteadiness on feet (R26.81);Muscle weakness (generalized) (M62.81)   Activity Tolerance Patient tolerated treatment well   Patient Left in chair;with call bell/phone within reach;with chair alarm set   Nurse Communication Mobility status;Patient requests pain meds        Time: 8983-8968 OT Time Calculation (min): 15 min  Charges: OT General Charges $OT Visit: 1 Visit OT Treatments $Self Care/Home Management : 8-22 mins  Izetta Claude, MS, OTR/L , CBIS ascom [phone]  06/12/24, 1:36 PM

## 2024-06-12 NOTE — TOC Progression Note (Addendum)
 Transition of Care [facility]) - Progression Note    Patient Details  Name: Lawrence [name] MRN: [id number] Date of Birth: [birth year]  [facility] CM/SW Contact  Alvaro Louder, [place] Phone Number: 06/12/2024, 1:51 PM  Clinical Narrative:  LCSWA met patient at the bedside to discuss HHPT and OT recommendation. Patient indicated that he wanted to use [facility]. LCSWA informed patient that it may be private pay. LCSWA provided list of HH agencies and patient selected [facility].   LCSWA reached out to [facility] coordinator Larraine to set up HHPT and OT for patient. [facility] coordinator has accepted the patient.   TOC to follow for discharge                      Expected Discharge Plan and Services                                               Social Drivers of Health (SDOH) Interventions SDOH Screenings   Food Insecurity: Food Insecurity Present (06/12/2024)  Housing: Unknown (06/12/2024)  Transportation Needs: No Transportation Needs (06/12/2024)  Utilities: Not At Risk (06/10/2024)  Alcohol Screen: Low Risk  (03/14/2024)  Depression (PHQ2-9): High Risk (06/09/2024)  Financial Resource Strain: Low Risk  (06/12/2024)  Physical Activity: Insufficiently Active (03/14/2024)  Social Connections: Socially Isolated (06/10/2024)  Stress: No Stress Concern Present (03/14/2024)  Recent Concern: Stress - Stress Concern Present (01/13/2024)  Tobacco Use: Medium Risk (06/12/2024)  Health Literacy: Adequate Health Literacy (03/14/2024)    Readmission Risk Interventions    09/30/2023   10:28 AM 08/05/2023   10:11 AM 07/13/2023   10:52 AM  Readmission Risk Prevention Plan  Transportation Screening Complete Complete Complete  Medication Review (RN Care Manager) Complete Complete Complete  PCP or Specialist appointment within 3-5 days of discharge Complete Complete Complete  SW Recovery Care/Counseling Consult Complete Complete Complete  Palliative Care  Screening Not Applicable Not Applicable Not Applicable  Skilled Nursing Facility Not Applicable Not Applicable Not Applicable

## 2024-06-13 ENCOUNTER — Other Ambulatory Visit: Payer: Self-pay

## 2024-06-13 DIAGNOSIS — R55 Syncope and collapse: Secondary | ICD-10-CM | POA: Diagnosis not present

## 2024-06-13 LAB — CBC
HCT: 51 % (ref 39.0–52.0)
Hemoglobin: 16.5 g/dL (ref 13.0–17.0)
MCH: 28.9 pg (ref 26.0–34.0)
MCHC: 32.4 g/dL (ref 30.0–36.0)
MCV: 89.5 fL (ref 80.0–100.0)
Platelets: 172 K/uL (ref 150–400)
RBC: 5.7 MIL/uL (ref 4.22–5.81)
RDW: 17.7 % — ABNORMAL HIGH (ref 11.5–15.5)
WBC: 9.5 K/uL (ref 4.0–10.5)
nRBC: 0 % (ref 0.0–0.2)

## 2024-06-13 LAB — BASIC METABOLIC PANEL WITH GFR
Anion gap: 6 (ref 5–15)
BUN: 24 mg/dL — ABNORMAL HIGH (ref 8–23)
CO2: 32 mmol/L (ref 22–32)
Calcium: 9.8 mg/dL (ref 8.9–10.3)
Chloride: 98 mmol/L (ref 98–111)
Creatinine, Ser: 1.33 mg/dL — ABNORMAL HIGH (ref 0.61–1.24)
GFR, Estimated: 58 mL/min — ABNORMAL LOW (ref >60)
Glucose, Bld: 133 mg/dL — ABNORMAL HIGH (ref 70–99)
Potassium: 4.8 mmol/L (ref 3.5–5.1)
Sodium: 136 mmol/L (ref 135–145)

## 2024-06-13 LAB — GLUCOSE, CAPILLARY
Glucose-Capillary: 151 mg/dL — ABNORMAL HIGH (ref 70–99)
Glucose-Capillary: 172 mg/dL — ABNORMAL HIGH (ref 70–99)

## 2024-06-13 LAB — TROPONIN I (HIGH SENSITIVITY)
Troponin I (High Sensitivity): 9 ng/L (ref <18)
Troponin I (High Sensitivity): 9 ng/L (ref <18)

## 2024-06-13 MED ORDER — DAPAGLIFLOZIN PROPANEDIOL 10 MG PO TABS
10.0000 mg | ORAL_TABLET | Freq: Every day | ORAL | Status: DC
  Administered 2024-06-13: 10 mg via ORAL
  Filled 2024-06-13: qty 1

## 2024-06-13 MED ORDER — MIDODRINE HCL 2.5 MG PO TABS
2.5000 mg | ORAL_TABLET | Freq: Three times a day (TID) | ORAL | 0 refills | Status: DC
  Filled 2024-06-13: qty 90, 30d supply, fill #0

## 2024-06-13 MED ORDER — METOPROLOL SUCCINATE ER 25 MG PO TB24: 25.0000 mg | ORAL_TABLET | Freq: Every day | ORAL | Status: DC

## 2024-06-13 NOTE — Progress Notes (Signed)
 [facility] CLINIC CARDIOLOGY PROGRESS NOTE       Patient ID: Lawrence [name] MRN: [id number] DOB/AGE: 1956-08-03 68 y.o.  Admit date: 06/09/2024 Referring Physician Dr. Marsa Primary Physician Gasper, Nancyann BRAVO, MD Primary Cardiologist [facility] Cardiology (Dr. [place] Lee) Reason for Consultation Medication regimen with orthostatic   HPI: Lawrence [name] is a 68 y.o. male  with a past medical history of coronary artery disease s/p CABG, multiple stents, ischemic cardiomyopathy, chronic HFrEF (improved EF 30% to 50%), hypertension, hyperlipidemia, paroxsymal atrial fibrillation, OSA, COPD, type 2 diabetes mellitus who presented to the ED on 06/09/2024 for syncope and chest pain. Prior to syncope event patient felt dizzy/lightheaded. Patient's home cardiac medications held due to orthostatic hypotension. Cardiology was consulted for further evaluation of cardiac medication regimen in setting of orthostasis.   Interval History: -Patient seen and examined this afternoon and laying comfortably in hospital bed. Patient reports he had constant chest discomfort that started last night while in bed. Imdur  was held during admission for a few days due to softer BP. Imdur  resumed and patient states chest discomfort has resolved this afternoon. Denies palpitations, lightheadedness, diaphoresis or SOB.  -Repeat EKG without acute ischemic changes.  Troponin negative. -Patients BP improving, borderline and HR stable this afternoon. Overnight Tele showed no significant events.  -Patient remains on room air with stable SpO2.    Pertinent Cardiac History (Most recent) LHC 10/2022   Prox LAD to Mid LAD lesion is 30% stenosed.   Mid LAD lesion is 40% stenosed.   Prox RCA lesion is 15% stenosed.   Dist RCA lesion is 25% stenosed.   1st Mrg lesion is 70% stenosed.   RPAV-2 lesion is 60% stenosed.   RPAV-1 lesion is 85% stenosed with 85% stenosed side branch in 1st RPL.   RPDA-1 lesion is 90% stenosed.   RPDA-2  lesion is 85% stenosed.   Non-stenotic Mid LAD to Dist LAD lesion was previously treated.   Non-stenotic Prox Cx to Mid Cx lesion was previously treated.   Non-stenotic Origin to Prox Graft lesion was previously treated.   LIMA and is small.   There is mild left ventricular systolic dysfunction.   LV end diastolic pressure is mildly elevated.   The left ventricular ejection fraction is 45-50% by visual estimate.  08/16/2023 8-15 day Zio Monitor Patient had a min HR of 38 bpm, max HR of 148 bpm, and avg HR of 63 bpm.  Predominant underlying rhythm was Sinus Rhythm. First Degree AV Block was  present. 4 Supraventricular Tachycardia runs occurred, the run with the fastest  interval lasting 16 beats with a max rate of 133 bpm (avg 118 bpm); the run with  the fastest interval was also the longest. Atrial Fibrillation occurred (24% burden),  ranging from 42-148 bpm (avg of 77 bpm), the longest lasting 1 day 11 hours with  an avg rate of 80 bpm. Atrial Fibrillation was present at de-activation of device.  Isolated SVEs were rare (<1.0%, 796), SVE Couplets were rare (<1.0%, 33), and SVE  Triplets were rare (<1.0%, 11). Isolated VEs were rare (<1.0%, 4022), VE Couplets  were rare (<1.0%, 33), and VE Triplets were rare (<1.0%, 2).   The patient was noted to have a 24% atrial fibrillation burden.  He is appropriately anticoagulated with apixaban .   Review of systems complete and found to be negative unless listed above    Past Medical History:  Diagnosis Date   A-fib (HCC)    Anemia    Anginal  pain (HCC)    Anxiety    Arthritis    Asthma    CAD (coronary artery disease)    a. 2002 CABGx2 (LIMA->LAD, VG->VG->OM1);  b. 09/2012 DES->OM;  c. 03/2015 PTCA of LAD [facility]) in setting of atretic LIMA; d. 05/2015 Cath [facility]): nonobs dzs; e. 06/2015 Cath ([facility]): LM nl, LAD 45p/d ISR, 50d, D1/2 small, LCX 50p/d ISR, OM1 70ost, 30 ISR, VG->OM1 50ost, 37m, LIMA->LAD 99p/d - atretic, RCA dom, nl; f.cath 10/16:  40-50%(FFR 0.90) pLAD, 75% (FFR 0.77) mLAD s/p PCI/DES, oRCA 40% (FFR0.95)   Cancer (HCC)    SKIN CANCER ON BACK   Celiac disease    Chronic diastolic CHF (congestive heart failure) (HCC)    a. 06/2009 Echo: EF 60-65%, Gr 1 DD, triv AI, mildly dil LA, nl RV.   COPD (chronic obstructive pulmonary disease) (HCC)    a. Chronic bronchitis and emphysema.   DDD (degenerative disc disease), lumbar    Diverticulosis    Dysrhythmia    Essential hypertension    GERD (gastroesophageal reflux disease)    History of hiatal hernia    History of kidney stones    H/O   History of tobacco abuse    a. Quit 2014.   Myocardial infarction [facility]) 2002   4 STENTS   Pancreatitis    PSVT (paroxysmal supraventricular tachycardia) (HCC)    a. 10/2012 Noted on Zio Patch.   Sleep apnea    LOST CORD TO CPAP -ONLY 02 @ BEDTIME   Tubular adenoma of colon    Type II diabetes mellitus (HCC)     Past Surgical History:  Procedure Laterality Date   BYPASS GRAFT     CARDIAC CATHETERIZATION N/A 07/12/2015   rocedure: Left Heart Cath and Cors/Grafts Angiography;  Surgeon: Victory LELON Sharps, MD;  Location: [facility] INVASIVE CV LAB;  Service: Cardiovascular;  Laterality: N/A;   CARDIAC CATHETERIZATION Right 10/07/2015   Procedure: Left Heart Cath and Cors/Grafts Angiography;  Surgeon: Denyse DELENA Bathe, MD;  Location: [facility] INVASIVE CV LAB;  Service: Cardiovascular;  Laterality: Right;   CARDIAC CATHETERIZATION N/A 04/06/2016   Procedure: Left Heart Cath and Coronary Angiography;  Surgeon: Cara JONETTA Lovelace, MD;  Location: [facility] INVASIVE CV LAB;  Service: Cardiovascular;  Laterality: N/A;   CARDIAC CATHETERIZATION  04/06/2016   Procedure: Bypass Graft Angiography;  Surgeon: Cara JONETTA Lovelace, MD;  Location: [facility] INVASIVE CV LAB;  Service: Cardiovascular;;   CARDIAC CATHETERIZATION N/A 11/02/2016   Procedure: Left Heart Cath and Cors/Grafts Angiography and possible PCI;  Surgeon: Cara JONETTA Lovelace, MD;  Location: [facility] INVASIVE CV LAB;   Service: Cardiovascular;  Laterality: N/A;   CARDIAC CATHETERIZATION N/A 11/02/2016   Procedure: Coronary Stent Intervention;  Surgeon: Cara JONETTA Lovelace, MD;  Location: [facility] INVASIVE CV LAB;  Service: Cardiovascular;  Laterality: N/A;   CARDIOVERSION N/A 06/29/2022   Procedure: CARDIOVERSION;  Surgeon: Hester Wolm PARAS, MD;  Location: [facility] ORS;  Service: Cardiovascular;  Laterality: N/A;   CHOLECYSTECTOMY     CIRCUMCISION N/A 06/09/2019   Procedure: CIRCUMCISION ADULT;  Surgeon: Francisca Redell BROCKS, MD;  Location: [facility] ORS;  Service: Urology;  Laterality: N/A;   COLONOSCOPY WITH PROPOFOL  N/A 04/01/2018   Procedure: COLONOSCOPY WITH PROPOFOL ;  Surgeon: Viktoria Lamar DASEN, MD;  Location: [facility] ENDOSCOPY;  Service: Endoscopy;  Laterality: N/A;   COLONOSCOPY WITH PROPOFOL  N/A 05/01/2023   Procedure: COLONOSCOPY WITH PROPOFOL ;  Surgeon: Unk Corinn Skiff, MD;  Location: [facility] ENDOSCOPY;  Service: Gastroenterology;  Laterality: N/A;   ESOPHAGEAL DILATION  ESOPHAGOGASTRODUODENOSCOPY (EGD) WITH PROPOFOL  N/A 04/01/2018   Procedure: ESOPHAGOGASTRODUODENOSCOPY (EGD) WITH PROPOFOL ;  Surgeon: Viktoria Lamar DASEN, MD;  Location: [facility] ENDOSCOPY;  Service: Endoscopy;  Laterality: N/A;   ESOPHAGOGASTRODUODENOSCOPY (EGD) WITH PROPOFOL  N/A 05/01/2023   Procedure: ESOPHAGOGASTRODUODENOSCOPY (EGD) WITH PROPOFOL ;  Surgeon: Unk Corinn Skiff, MD;  Location: [facility] ENDOSCOPY;  Service: Gastroenterology;  Laterality: N/A;   GIVENS CAPSULE STUDY  05/01/2023   Procedure: GIVENS CAPSULE STUDY;  Surgeon: Unk Corinn Skiff, MD;  Location: [facility] ENDOSCOPY;  Service: Gastroenterology;;   LEFT HEART CATH AND CORS/GRAFTS ANGIOGRAPHY N/A 06/12/2019   Procedure: LEFT HEART CATH AND CORS/GRAFTS ANGIOGRAPHY;  Surgeon: Bosie Vinie LABOR, MD;  Location: [facility] INVASIVE CV LAB;  Service: Cardiovascular;  Laterality: N/A;   LEFT HEART CATH AND CORS/GRAFTS ANGIOGRAPHY N/A 03/11/2020   Procedure: LEFT HEART CATH AND CORS/GRAFTS ANGIOGRAPHY;  Surgeon:  Ammon Blunt, MD;  Location: [facility] INVASIVE CV LAB;  Service: Cardiovascular;  Laterality: N/A;   LEFT HEART CATH AND CORS/GRAFTS ANGIOGRAPHY N/A 05/01/2021   Procedure: LEFT HEART CATH AND CORS/GRAFTS ANGIOGRAPHY;  Surgeon: Hester Wolm PARAS, MD;  Location: [facility] INVASIVE CV LAB;  Service: Cardiovascular;  Laterality: N/A;   LEFT HEART CATH AND CORS/GRAFTS ANGIOGRAPHY N/A 11/02/2022   Procedure: LEFT HEART CATH AND CORS/GRAFTS ANGIOGRAPHY;  Surgeon: Florencio Cara BIRCH, MD;  Location: [facility] INVASIVE CV LAB;  Service: Cardiovascular;  Laterality: N/A;   TONSILLECTOMY     VASCULAR SURGERY      Medications Prior to Admission  Medication Sig Dispense Refill Last Dose/Taking   albuterol  (PROVENTIL ) (2.5 MG/3ML) 0.083% nebulizer solution Take 3 mLs (2.5 mg total) by nebulization every 6 (six) hours as needed for wheezing or shortness of breath. 150 mL 2 Taking As Needed   allopurinol  (ZYLOPRIM ) 300 MG tablet TAKE 1 TABLET BY MOUTH TWICE A DAY 180 tablet 1 06/09/2024 Morning   ALPRAZolam  (XANAX ) 1 MG tablet TAKE 1 TABLET BY MOUTH 3 TIMES A DAY AS NEEDED 90 tablet 3 Taking As Needed   aluminum -magnesium  hydroxide-simethicone  (MAALOX) 200-200-20 MG/5ML SUSP Take 30 mLs by mouth 3 (three) times daily as needed.   Taking As Needed   amLODipine  (NORVASC ) 10 MG tablet Take 10 mg by mouth daily.   06/09/2024   apixaban  (ELIQUIS ) 5 MG TABS tablet Take 1 tablet (5 mg total) by mouth 2 (two) times daily. 60 tablet 5 06/09/2024 Morning   atorvastatin  (LIPITOR ) 80 MG tablet TAKE 1 TABLET BY MOUTH AT BEDTIME 90 tablet 4 06/08/2024   BREZTRI  AEROSPHERE 160-9-4.8 MCG/ACT AERO inhaler INHALE 2 PUFFS BY MOUTH INTO THE LUNGS 2TIMES DAILY 10.7 g 11 06/09/2024   carvedilol  (COREG ) 3.125 MG tablet TAKE 1 TABLET BY MOUTH TWICE A DAY 60 tablet 5 06/09/2024 Morning   cetirizine  (ZYRTEC ) 10 MG tablet Take 1 tablet (10 mg total) by mouth at bedtime. 90 tablet 1 Taking   Cyanocobalamin  (VITAMIN B-12) 1000 MCG SUBL Place 1 tablet  under the tongue daily.   06/09/2024   dapagliflozin  propanediol (FARXIGA ) 10 MG TABS tablet TAKE 1 TABLET BY MOUTH DAILY 30 tablet 5 06/09/2024   famotidine  (PEPCID ) 20 MG tablet Take 1 tablet (20 mg total) by mouth 2 (two) times daily. 60 tablet 2 Taking   furosemide  (LASIX ) 20 MG tablet Take 1 tablet (20 mg total) by mouth daily as needed. 30 tablet 2 Taking As Needed   gabapentin  (NEURONTIN ) 300 MG capsule Take 1 capsule (300 mg total) by mouth 2 (two) times daily. 60 capsule 5 06/09/2024 Morning   iron  polysaccharides (FERREX 150) 150  MG capsule Take one capsul every Monday, Wednesday, and Friday   06/09/2024   isosorbide  mononitrate (IMDUR ) 120 MG 24 hr tablet Take 1 tablet (120 mg total) by mouth daily. 30 tablet 11 06/09/2024   lithium  carbonate 150 MG capsule Take 150 mg by mouth 2 (two) times daily.   06/09/2024 Morning   methocarbamol  (ROBAXIN ) 500 MG tablet TAKE 1 TABLET BY MOUTH EVERY 8 HOURS AS NEEDED FOR MUSCLE SPASMS 90 tablet 3 Taking As Needed   metoprolol  succinate (TOPROL -XL) 25 MG 24 hr tablet Take 12.5 mg by mouth daily.   06/09/2024   nitroGLYCERIN  (NITROSTAT ) 0.4 MG SL tablet Place 1 tablet (0.4 mg total) under the tongue every 5 (five) minutes as needed for chest pain (Do not take if blood pressure is low, systolic BP<110).   Taking As Needed   omega-3 acid ethyl esters (LOVAZA ) 1 g capsule TAKE 4 CAPSULES (4 GRAMS TOTAL) BY MOUTHDAILY 120 capsule 5 06/09/2024   oxyCODONE -acetaminophen  (PERCOCET) 10-325 MG tablet TAKE 1 TABLET BY MOUTH EVERY 4 HOURS AS NEEDED FOR PAIN 180 tablet 0 Taking As Needed   pantoprazole  (PROTONIX ) 40 MG tablet TAKE 1 TABLET BY MOUTH 2 TIMES DAILY (30MINUTES BEFORE A MEAL) 60 tablet 0 06/09/2024   ranolazine  (RANEXA ) 1000 MG SR tablet Take 1 tablet (1,000 mg total) by mouth 2 (two) times daily. 60 tablet 0 Taking   sacubitril -valsartan  (ENTRESTO ) 24-26 MG Take 1 tablet by mouth 2 (two) times daily.   06/09/2024 Morning   sennosides-docusate sodium  (SENOKOT-S)  8.6-50 MG tablet Take 1 tablet by mouth daily as needed for constipation.   Taking As Needed   spironolactone  (ALDACTONE ) 25 MG tablet Take 1 tablet (25 mg total) by mouth daily. 90 tablet 2 06/09/2024   traZODone  (DESYREL ) 150 MG tablet Take 1 tablet (150 mg total) by mouth at bedtime as needed for sleep. 30 tablet 3 06/08/2024   TRINTELLIX  10 MG TABS tablet Take 10 mg by mouth daily.   06/09/2024   Accu-Chek Softclix Lancets lancets Use as instructed to check sugar daily for type 2 diabetes. 100 each 4    Blood Glucose Calibration (ACCU-CHEK GUIDE CONTROL) LIQD Use with blood glucose monitor as directed 1 each 3    Blood Glucose Monitoring Suppl (ACCU-CHEK GUIDE) w/Device KIT Use to check blood sugars as directed 1 kit 0    glucose blood (ACCU-CHEK GUIDE) test strip Use as instructed to check sugar daily for type 2 diabetes. 100 strip 4    metFORMIN  (GLUCOPHAGE -XR) 500 MG 24 hr tablet TAKE 1 TABLET BY MOUTH DAILY WITH EVENING MEAL 60 tablet 1    metoCLOPramide  (REGLAN ) 10 MG tablet Take 1 tablet (10 mg total) by mouth 4 (four) times daily -  before meals and at bedtime for 15 days. (Patient not taking: Reported on 06/09/2024) 60 tablet 0    OXYGEN  Inhale 2 L into the lungs at bedtime as needed (for shortness of breath).      Social History   Socioeconomic History   Marital status: Widowed    Spouse name: Not on file   Number of children: 1   Years of education: Not on file   Highest education level: 10th grade  Occupational History   Occupation: Disabled   Occupation: on Tree surgeon  Tobacco Use   Smoking status: Former    Current packs/day: 0.00    Average packs/day: 3.0 packs/day for 50.0 years (150.0 ttl pk-yrs)    Types: Cigarettes    Start date: 04/23/1963  Quit date: 04/22/2013    Years since quitting: 11.1   Smokeless tobacco: Never   Tobacco comments:    Reports not smoking for approx 8 years.  Vaping Use   Vaping status: Never Used  Substance and Sexual Activity    Alcohol use: No    Comment: remotely quit alcohol use. Hx of heavy alcohol use.   Drug use: Not Currently    Types: Marijuana   Sexual activity: Not Currently  Other Topics Concern   Not on file  Social History Narrative   Pt lives in [place] with wife.  Does not routinely exercise.   Social Drivers of Corporate investment banker Strain: Low Risk  (06/12/2024)   Overall Financial Resource Strain (CARDIA)    Difficulty of Paying Living Expenses: Not hard at all  Food Insecurity: Food Insecurity Present (06/12/2024)   Hunger Vital Sign    Worried About Running Out of Food in the Last Year: Sometimes true    Ran Out of Food in the Last Year: Not on file  Transportation Needs: No Transportation Needs (06/12/2024)   PRAPARE - Administrator, Civil Service (Medical): No    Lack of Transportation (Non-Medical): No  Physical Activity: Insufficiently Active (03/14/2024)   Exercise Vital Sign    Days of Exercise per Week: 3 days    Minutes of Exercise per Session: 20 min  Stress: No Stress Concern Present (03/14/2024)   [employer] of Occupational Health - Occupational Stress Questionnaire    Feeling of Stress : Only a little  Recent Concern: Stress - Stress Concern Present (01/13/2024)   [employer] of Occupational Health - Occupational Stress Questionnaire    Feeling of Stress : To some extent  Social Connections: Socially Isolated (06/10/2024)   Social Connection and Isolation Panel    Frequency of Communication with Friends and Family: More than three times a week    Frequency of Social Gatherings with Friends and Family: Once a week    Attends Religious Services: Never    Database administrator or Organizations: No    Attends Banker Meetings: Never    Marital Status: Widowed  Intimate Partner Violence: Not At Risk (06/10/2024)   Humiliation, Afraid, Rape, and Kick questionnaire    Fear of Current or Ex-Partner: No    Emotionally Abused: No     Physically Abused: No    Sexually Abused: No    Family History  Problem Relation Age of Onset   Heart attack Mother    Depression Mother    Heart disease Mother    COPD Mother    Hypertension Mother    Heart attack Father    Diabetes Father    Depression Father    Heart disease Father    Cirrhosis Father    Parkinson's disease Brother      Vitals:   06/13/24 0500 06/13/24 0751 06/13/24 1027 06/13/24 1130  BP:  117/73 110/77 108/74  Pulse:  (!) 59 78 69  Resp:  18 16 12   Temp:  98.1 F (36.7 C) (!) 97.4 F (36.3 C) (!) 97.4 F (36.3 C)  TempSrc:  Oral Axillary   SpO2:  94%  97%  Weight: 80.8 kg     Height:        PHYSICAL EXAM General: Chronically ill-appearing elderly male, well nourished, in no acute distress. HEENT: Normocephalic and atraumatic. Neck: No JVD.   Lungs: Normal respiratory effort on room air. Clear bilaterally to auscultation. No  wheezes, crackles, rhonchi.  Heart: Irregularly, irregular, controlled rate. Normal S1 and S2 without gallops or murmurs.  Abdomen: Non-distended appearing.  Msk: Normal strength and tone for age. Extremities: Warm and well perfused. No clubbing, cyanosis, edema.  Neuro: Alert and oriented X 3. Psych: Answers questions appropriately.   Labs: Basic Metabolic Panel: Recent Labs    06/12/24 0003 06/13/24 0440  NA  --  136  K 4.6 4.8  CL  --  98  CO2  --  32  GLUCOSE  --  133*  BUN  --  24*  CREATININE  --  1.33*  CALCIUM   --  9.8  MG 2.1  --    Liver Function Tests: No results for input(s): AST, ALT, ALKPHOS, BILITOT, PROT, ALBUMIN in the last 72 hours.  No results for input(s): LIPASE, AMYLASE in the last 72 hours. CBC: Recent Labs    06/13/24 0440  WBC 9.5  HGB 16.5  HCT 51.0  MCV 89.5  PLT 172   Cardiac Enzymes: Recent Labs    06/12/24 0003 06/12/24 0832 06/13/24 0953  TROPONINIHS 9 9 9    BNP: No results for input(s): BNP in the last 72 hours.  D-Dimer: No results for  input(s): DDIMER in the last 72 hours. Hemoglobin A1C: No results for input(s): HGBA1C in the last 72 hours. Fasting Lipid Panel: No results for input(s): CHOL, HDL, LDLCALC, TRIG, CHOLHDL, LDLDIRECT in the last 72 hours. Thyroid  Function Tests: No results for input(s): TSH, T4TOTAL, T3FREE, THYROIDAB in the last 72 hours.  Invalid input(s): FREET3 Anemia Panel: No results for input(s): VITAMINB12, FOLATE, FERRITIN, TIBC, IRON , RETICCTPCT in the last 72 hours.   Radiology: ECHOCARDIOGRAM COMPLETE Result Date: 06/10/2024    ECHOCARDIOGRAM REPORT   Patient Name:   BURTON GAHAN [facility] Date of Exam: 06/10/2024 Medical Rec #:  [id number]       Height:       67.0 in Accession #:    [id number]      Weight:       178.1 lb Date of Birth:  [birth year]       BSA:          1.925 m Patient Age:    68 years        BP:           133/89 mmHg Patient Gender: M               HR:           76 bpm. Exam Location:  [facility] Procedure: 2D Echo, Cardiac Doppler, Color Doppler and Intracardiac            Opacification Agent (Both Spectral and Color Flow Doppler were            utilized during procedure). Indications:     Syncope R55  History:         Patient has prior history of Echocardiogram examinations, most                  recent 08/08/2023.  Sonographer:     Thedora Louder RDCS, FASE Referring Phys:  [id number] XILIN NIU Diagnosing Phys: Cara JONETTA Lovelace MD  Sonographer Comments: Technically difficult study due to poor echo windows and suboptimal apical window. Image acquisition challenging due to patient body habitus and Image acquisition challenging due to respiratory motion. IMPRESSIONS  1. Left ventricular ejection fraction, by estimation, is 45 to 50%. The left ventricle has mildly decreased function. The left ventricle demonstrates global hypokinesis.  There is mild concentric left ventricular hypertrophy. Left ventricular diastolic parameters are consistent with Grade I diastolic dysfunction  (impaired relaxation).  2. Right ventricular systolic function is normal. The right ventricular size is normal.  3. The mitral valve is normal in structure. No evidence of mitral valve regurgitation.  4. The aortic valve is normal in structure. Aortic valve regurgitation is not visualized. FINDINGS  Left Ventricle: Left ventricular ejection fraction, by estimation, is 45 to 50%. The left ventricle has mildly decreased function. The left ventricle demonstrates global hypokinesis. Definity  contrast agent was given IV to delineate the left ventricular  endocardial borders. Strain was performed and the global longitudinal strain is indeterminate. The left ventricular internal cavity size was normal in size. There is mild concentric left ventricular hypertrophy. Left ventricular diastolic parameters are  consistent with Grade I diastolic dysfunction (impaired relaxation). Right Ventricle: The right ventricular size is normal. No increase in right ventricular wall thickness. Right ventricular systolic function is normal. Left Atrium: Left atrial size was normal in size. Right Atrium: Right atrial size was normal in size. Pericardium: There is no evidence of pericardial effusion. Mitral Valve: The mitral valve is normal in structure. No evidence of mitral valve regurgitation. Tricuspid Valve: The tricuspid valve is normal in structure. Tricuspid valve regurgitation is not demonstrated. Aortic Valve: The aortic valve is normal in structure. Aortic valve regurgitation is not visualized. Aortic regurgitation PHT measures 747 msec. Aortic valve peak gradient measures 6.2 mmHg. Pulmonic Valve: The pulmonic valve was normal in structure. Pulmonic valve regurgitation is not visualized. Aorta: The ascending aorta was not well visualized. IAS/Shunts: No atrial level shunt detected by color flow Doppler. Additional Comments: 3D was performed not requiring image post processing on an independent workstation and was indeterminate.   LEFT VENTRICLE PLAX 2D LVIDd:         2.80 cm     Diastology LVIDs:         2.20 cm     LV e' medial:    10.80 cm/s LV PW:         1.40 cm     LV E/e' medial:  6.3 LV IVS:        1.30 cm     LV e' lateral:   13.10 cm/s LVOT diam:     2.00 cm     LV E/e' lateral: 5.2 LV SV:         53 LV SV Index:   28 LVOT Area:     3.14 cm  LV Volumes (MOD) LV vol d, MOD A4C: 74.4 ml LV vol s, MOD A4C: 37.1 ml LV SV MOD A4C:     74.4 ml RIGHT VENTRICLE RV Basal diam:  2.90 cm RV S prime:     7.62 cm/s LEFT ATRIUM           Index        RIGHT ATRIUM           Index LA diam:      5.10 cm 2.65 cm/m   RA Area:     13.20 cm LA Vol (A2C): 82.8 ml 43.01 ml/m  RA Volume:   28.10 ml  14.60 ml/m LA Vol (A4C): 47.7 ml 24.78 ml/m  AORTIC VALVE                 PULMONIC VALVE AV Area (Vmax): 2.33 cm     PV Vmax:        0.67 m/s AV Vmax:  124.00 cm/s  PV Peak grad:   1.8 mmHg AV Peak Grad:   6.2 mmHg     RVOT Peak grad: 2 mmHg LVOT Vmax:      91.80 cm/s LVOT Vmean:     60.800 cm/s LVOT VTI:       0.169 m AI PHT:         747 msec  AORTA Ao Root diam: 3.70 cm Ao Asc diam:  3.20 cm MITRAL VALVE MV Area (PHT): 4.29 cm    SHUNTS MV Decel Time: 177 msec    Systemic VTI:  0.17 m MV E velocity: 68.10 cm/s  Systemic Diam: 2.00 cm Cara JONETTA Lovelace MD Electronically signed by Cara JONETTA Lovelace MD Signature Date/Time: 06/10/2024/3:38:28 PM    Final    CT Angio Chest Pulmonary Embolism (PE) W or WO Contrast Result Date: 06/10/2024 CLINICAL DATA:  Syncope/presyncope, cerebrovascular cause suspected EXAM: CT ANGIOGRAPHY CHEST WITH CONTRAST TECHNIQUE: Multidetector CT imaging of the chest was performed using the standard protocol during bolus administration of intravenous contrast. Multiplanar CT image reconstructions and MIPs were obtained to evaluate the vascular anatomy. RADIATION DOSE REDUCTION: This exam was performed according to the departmental dose-optimization program which includes automated exposure control, adjustment of the mA  and/or kV according to patient size and/or use of iterative reconstruction technique. CONTRAST:  OMNIPAQUE  IOHEXOL  350 MG/ML SOLN COMPARISON:  05/27/2024 FINDINGS: Cardiovascular: Adequate opacification of the pulmonary arterial tree. No intraluminal filling defect identified to suggest acute pulmonary embolism. Central pulmonary arteries are mildly enlarged in keeping with changes of pulmonary arterial hypertension. Status post coronary artery bypass grafting. Cardiac size is mildly enlarged, stable. No pericardial effusion. Moderate atherosclerotic calcification within the thoracic aorta. No aortic aneurysm. Mediastinum/Nodes: No enlarged mediastinal, hilar, or axillary lymph nodes. Thyroid  gland, trachea, and esophagus demonstrate no significant findings. Small hiatal hernia Lungs/Pleura: Previously noted extensive pulmonary infiltrate throughout the right lung has significantly improved in the interval with mild residual peribronchial and centrilobular nodular infiltrate. Mild emphysema. Stable bronchial wall thickening in keeping with airway inflammation. Scattered airway impaction noted within right lower lobe. No central obstructing mass. No pneumothorax or pleural effusion. Upper Abdomen: No acute abnormality. Musculoskeletal: No acute bone abnormality. No lytic or blastic bone lesion. Osseous structures are age appropriate. Review of the MIP images confirms the above findings. IMPRESSION: 1. No pulmonary embolism. 2. Mild enlargement of the central pulmonary arteries in keeping with changes of pulmonary arterial hypertension. 3. Mild cardiomegaly. Status post coronary artery bypass grafting. 4. Improved airspace infiltrate throughout the right lung in keeping a resolving infectious process 5. Emphysema.  Airway inflammation. Aortic Atherosclerosis (ICD10-I70.0) and Emphysema (ICD10-J43.9). Electronically Signed   By: Dorethia Molt M.D.   On: 06/10/2024 02:56   DG Lumbar Spine 2-3 Views Result  Date: 06/09/2024 CLINICAL DATA:  Recent fall with low back pain, initial encounter EXAM: LUMBAR SPINE - 3 VIEW COMPARISON:  03/28/2024 FINDINGS: Five lumbar type vertebral bodies are well visualized. Vertebral body height is well maintained. Mild osteophytic changes are seen. Degenerative change with mild anterolisthesis of L4 on L5 is again noted. Aortic calcifications are seen. IMPRESSION: Multilevel degenerative change without acute abnormality. Electronically Signed   By: Oneil Devonshire M.D.   On: 06/09/2024 19:29   DG Chest 2 View Result Date: 06/09/2024 CLINICAL DATA:  Dizziness and chest pain EXAM: CHEST - 2 VIEW COMPARISON:  05/29/2024 FINDINGS: Cardiac shadow is stable. Postsurgical changes are again noted. Aortic calcifications are seen. The lungs are well aerated bilaterally.  No focal infiltrate or effusion is seen. No bony abnormality is noted. IMPRESSION: No active cardiopulmonary disease. Electronically Signed   By: Oneil Devonshire M.D.   On: 06/09/2024 19:28   CT Head Wo Contrast Result Date: 06/09/2024 CLINICAL DATA:  Dizziness fall LOC EXAM: CT HEAD WITHOUT CONTRAST CT CERVICAL SPINE WITHOUT CONTRAST TECHNIQUE: Multidetector CT imaging of the head and cervical spine was performed following the standard protocol without intravenous contrast. Multiplanar CT image reconstructions of the cervical spine were also generated. RADIATION DOSE REDUCTION: This exam was performed according to the departmental dose-optimization program which includes automated exposure control, adjustment of the mA and/or kV according to patient size and/or use of iterative reconstruction technique. COMPARISON:  CT brain and cervical spine 05/27/2024 FINDINGS: CT HEAD FINDINGS Brain: No acute territorial infarction, hemorrhage or intracranial mass. The ventricles are nonenlarged Vascular: No hyperdense vessel or unexpected calcification. Skull: Normal. Negative for fracture or focal lesion. Sinuses/Orbits: No acute finding.  Other: None CT CERVICAL SPINE FINDINGS Alignment: Straightening of the cervical spine. No subluxation. Facet alignment is normal Skull base and vertebrae: No fracture. Chronic lucent lesion at C3, likely benign. Soft tissues and spinal canal: No prevertebral fluid or swelling. No visible canal hematoma. Disc levels: No significant canal stenosis or foraminal narrowing. Minimal disc space narrowing C5-C6 and C6-C7 Upper chest: Negative. Other: None IMPRESSION: Negative non contrasted CT appearance of the brain. Straightening of the cervical spine with minimal degenerative change. No acute osseous abnormality. Electronically Signed   By: Luke Bun M.D.   On: 06/09/2024 19:19   CT Cervical Spine Wo Contrast Result Date: 06/09/2024 CLINICAL DATA:  Dizziness fall LOC EXAM: CT HEAD WITHOUT CONTRAST CT CERVICAL SPINE WITHOUT CONTRAST TECHNIQUE: Multidetector CT imaging of the head and cervical spine was performed following the standard protocol without intravenous contrast. Multiplanar CT image reconstructions of the cervical spine were also generated. RADIATION DOSE REDUCTION: This exam was performed according to the departmental dose-optimization program which includes automated exposure control, adjustment of the mA and/or kV according to patient size and/or use of iterative reconstruction technique. COMPARISON:  CT brain and cervical spine 05/27/2024 FINDINGS: CT HEAD FINDINGS Brain: No acute territorial infarction, hemorrhage or intracranial mass. The ventricles are nonenlarged Vascular: No hyperdense vessel or unexpected calcification. Skull: Normal. Negative for fracture or focal lesion. Sinuses/Orbits: No acute finding. Other: None CT CERVICAL SPINE FINDINGS Alignment: Straightening of the cervical spine. No subluxation. Facet alignment is normal Skull base and vertebrae: No fracture. Chronic lucent lesion at C3, likely benign. Soft tissues and spinal canal: No prevertebral fluid or swelling. No visible  canal hematoma. Disc levels: No significant canal stenosis or foraminal narrowing. Minimal disc space narrowing C5-C6 and C6-C7 Upper chest: Negative. Other: None IMPRESSION: Negative non contrasted CT appearance of the brain. Straightening of the cervical spine with minimal degenerative change. No acute osseous abnormality. Electronically Signed   By: Luke Bun M.D.   On: 06/09/2024 19:19   CT ABDOMEN PELVIS W CONTRAST Result Date: 05/29/2024 CLINICAL DATA:  Abdominal pain, emesis, hematemesis EXAM: CT ABDOMEN AND PELVIS WITH CONTRAST TECHNIQUE: Multidetector CT imaging of the abdomen and pelvis was performed using the standard protocol following bolus administration of intravenous contrast. RADIATION DOSE REDUCTION: This exam was performed according to the departmental dose-optimization program which includes automated exposure control, adjustment of the mA and/or kV according to patient size and/or use of iterative reconstruction technique. CONTRAST:  OMNIPAQUE  IOHEXOL  300 MG/ML  SOLN COMPARISON:  May 27, 2024 CTA FINDINGS: lower  chest: Redemonstration of opacities at the visualized right lung base. Hepatobiliary: No suspicious liver lesions. Status post cholecystectomy. No biliary ductal dilation. Pancreas: Unremarkable. Spleen: Unremarkable. Adrenals/Urinary Tract: Unchanged mild bilateral adrenal gland nodularity. No nephrolithiasis or hydronephrosis. Stomach/Bowel: No evidence of bowel obstruction or inflammation. Appendix is unremarkable. Small hiatal hernia. Vascular/Lymphatic: Normal caliber aorta. No lymphadenopathy by size criteria. Reproductive: Unremarkable. Other: No free air or free fluid. Musculoskeletal: No acute osseous findings. IMPRESSION: No acute pathology in the abdomen or pelvis. Redemonstration of likely pneumonia involving the partially imaged right lower lung. Electronically Signed   By: Michaeline Blanch M.D.   On: 05/29/2024 12:37   DG Chest Portable 1 View Result Date:  05/29/2024 CLINICAL DATA:  COPD.  Recent pneumonia.  Shortness of breath EXAM: PORTABLE CHEST 1 VIEW COMPARISON:  X-ray 05/27/2024. FINDINGS: Hyperinflation. Chronic interstitial changes. Normal cardiopericardial silhouette with sternal wires. Coronary artery stents are suggested. Calcifications towards the mitral valve annulus. On the prior there is some hazy ill-defined opacity right upper lung which show some improvement with some residual. Recommend continued follow-up. No pneumothorax or effusion. No edema. IMPRESSION: Slight decrease in the hazy opacity right upper lung with some residual. Recommend continued follow-up to confirm resolution. Electronically Signed   By: Ranell Bring M.D.   On: 05/29/2024 10:14   CT Angio Chest/Abd/Pel for Dissection W and/or Wo Contrast Result Date: 05/27/2024 CLINICAL DATA:  Acute aortic syndrome suspected. Chest pain following fall. EXAM: CT ANGIOGRAPHY CHEST, ABDOMEN AND PELVIS TECHNIQUE: Non-contrast CT of the chest was initially obtained. Multidetector CT imaging through the chest, abdomen and pelvis was performed using the standard protocol during bolus administration of intravenous contrast. Multiplanar reconstructed images and MIPs were obtained and reviewed to evaluate the vascular anatomy. RADIATION DOSE REDUCTION: This exam was performed according to the departmental dose-optimization program which includes automated exposure control, adjustment of the mA and/or kV according to patient size and/or use of iterative reconstruction technique. CONTRAST:  OMNIPAQUE  IOHEXOL  350 MG/ML SOLN COMPARISON:  03/17/2024. FINDINGS: CTA CHEST FINDINGS Cardiovascular: The heart is stable in size and multi-vessel coronary artery calcifications are noted. There is atherosclerotic calcification of the aorta without evidence of aneurysm. No dissection or intramural hematoma is seen. The pulmonary trunk is mildly distended suggesting underlying pulmonary artery hypertension.  Mediastinum/Nodes: A prominent lymph node is noted in the subcarinal space measuring up to 1.1 cm. Prominent lymph nodes are present at the right hilum measuring up to 1 cm. No axillary lymphadenopathy is seen. The thyroid  gland, trachea, and esophagus are within normal limits. There is a small hiatal hernia. Lungs/Pleura: Scattered airspace opacities are noted in the right lung with mild consolidation in the right upper lobe. No pleural effusion or pneumothorax is seen. Musculoskeletal: Sternotomy wires are noted. Degenerative changes are present in the thoracic spine. No acute fracture is seen. Review of the MIP images confirms the above findings. CTA ABDOMEN AND PELVIS FINDINGS VASCULAR Aorta: Normal caliber aorta without aneurysm, dissection, vasculitis or significant stenosis. Aortic atherosclerosis. Celiac: Patent without evidence of aneurysm, dissection, vasculitis or significant stenosis. SMA: Patent without evidence of aneurysm, dissection, vasculitis or significant stenosis. Renals: Both renal arteries are patent without evidence of aneurysm, dissection, vasculitis, fibromuscular dysplasia or significant stenosis. IMA: Patent without evidence of aneurysm, dissection, vasculitis or significant stenosis. Inflow: Patent without evidence of aneurysm, dissection, vasculitis or significant stenosis. Veins: No obvious venous abnormality within the limitations of this arterial phase study. Review of the MIP images confirms the above findings. NON-VASCULAR Hepatobiliary: No  focal liver abnormality is seen. Status post cholecystectomy. No biliary dilatation. Pancreas: Unremarkable. No pancreatic ductal dilatation or surrounding inflammatory changes. Spleen: Normal in size without focal abnormality. Adrenals/Urinary Tract: The adrenal glands are within normal limits. The kidneys enhance symmetrically. No renal calculus or hydronephrosis bilaterally. There is diffuse bladder wall thickening. Stomach/Bowel: There is a  small hiatal hernia. The stomach is otherwise within normal limits. No bowel obstruction, free air, or pneumatosis. Scattered diverticula are present along the colon without evidence of diverticulitis. Appendix appears normal. Lymphatic: No abdominal or pelvic lymphadenopathy. Reproductive: Prostate is unremarkable. Other: No abdominopelvic ascites. A fat containing inguinal hernia is noted on the left. Musculoskeletal: Degenerative changes are present in the lumbar spine. No acute fracture is seen. Review of the MIP images confirms the above findings. IMPRESSION: 1. Aortic atherosclerosis with no evidence of aortic aneurysm or dissection. 2. Scattered airspace opacities in the right lung, suspicious for pneumonia. 3. Diffuse bladder wall thickening, possible infectious or inflammatory cystitis. 4. Diverticulosis without diverticulitis. 5. Coronary artery calcifications. Electronically Signed   By: Leita Birmingham M.D.   On: 05/27/2024 16:08   CT Maxillofacial Wo Contrast Result Date: 05/27/2024 CLINICAL DATA:  Facial trauma, blunt EXAM: CT MAXILLOFACIAL WITHOUT CONTRAST TECHNIQUE: Multidetector CT imaging of the maxillofacial structures was performed. Multiplanar CT image reconstructions were also generated. RADIATION DOSE REDUCTION: This exam was performed according to the departmental dose-optimization program which includes automated exposure control, adjustment of the mA and/or kV according to patient size and/or use of iterative reconstruction technique. COMPARISON:  CT the head dated Mar 29, 2024. FINDINGS: Osseous: The facial bones are intact. The patient is edentulous. No bony lesions are present. Orbits: Normal orbits. Sinuses: Minimal mucosal disease within the maxillary and ethmoid sinuses and floor left frontal sinus. Soft tissues: Unremarkable. Limited intracranial: Negative. IMPRESSION: 1. Mild paranasal sinuses disease. No evidence of acute traumatic injury. Electronically Signed   By: Evalene Coho M.D.   On: 05/27/2024 15:53   CT HEAD WO CONTRAST ( ) Result Date: 05/27/2024 CLINICAL DATA:  Headache, new onset (Age >= 51y) EXAM: CT HEAD WITHOUT CONTRAST TECHNIQUE: Contiguous axial images were obtained from the base of the skull through the vertex without intravenous contrast. RADIATION DOSE REDUCTION: This exam was performed according to the departmental dose-optimization program which includes automated exposure control, adjustment of the mA and/or kV according to patient size and/or use of iterative reconstruction technique. COMPARISON:  CT the head dated Mar 29, 2024. FINDINGS: Brain: Normal brain. No evidence of hemorrhage, mass, cortical infarct or hydrocephalus. Vascular: Negative. Skull: Negative. Sinuses/Orbits: Minimal mucosal disease within the maxillary sinuses. Normal orbits. Other: None. IMPRESSION: Normal. Electronically Signed   By: Evalene Coho M.D.   On: 05/27/2024 15:51   CT Cervical Spine Wo Contrast Result Date: 05/27/2024 CLINICAL DATA:  Bone lesion, cervical spine, incidental EXAM: CT CERVICAL SPINE WITHOUT CONTRAST TECHNIQUE: Multidetector CT imaging of the cervical spine was performed without intravenous contrast. Multiplanar CT image reconstructions were also generated. RADIATION DOSE REDUCTION: This exam was performed according to the departmental dose-optimization program which includes automated exposure control, adjustment of the mA and/or kV according to patient size and/or use of iterative reconstruction technique. COMPARISON:  CT of the cervical spine dated Mar 29, 2024. FINDINGS: Alignment: Normal. Skull base and vertebrae: Benign lucency within the left lateral aspect of the C3 vertebral body, likely representing cyst or hemangioma. Soft tissues and spinal canal: No paraspinous soft tissue swelling or injury evident. Disc levels: The disc spaces are  satisfactory preserved and the spinal canal and neural foramina appear patent. Upper chest: Negative.  Other: None. IMPRESSION: 1. Highly likely benign lucent lesion within the C3 vertebral body. Otherwise, negative. Electronically Signed   By: Evalene Coho M.D.   On: 05/27/2024 15:50   DG Chest 2 View Result Date: 05/27/2024 CLINICAL DATA:  Chest pain following fall. EXAM: CHEST - 2 VIEW; RIGHT SHOULDER - 2+ VIEW COMPARISON:  05/18/2024. FINDINGS: Chest: The heart size and mediastinal contours are within normal limits. There is atherosclerotic calcification of the aorta. Scattered airspace opacities are noted in the right lung. No effusion or pneumothorax is seen. Sternotomy wires are present over the midline. Degenerative changes are noted in the thoracic spine. No acute osseous abnormality. Right shoulder: There is no evidence of acute fracture or dislocation. Moderate degenerative changes are noted at the acromioclavicular joint. The humeral head is high riding which may be associated with chronic rotator cuff tear. IMPRESSION: 1. Patchy airspace disease in the right lung, concerning for pneumonia. 2. No acute fracture or dislocation at the right shoulder. 3. Moderate degenerative changes at the glenohumeral and acromioclavicular joints. 4. High riding humeral head on the right which may be associated with chronic rotator cuff tear. Electronically Signed   By: Leita Birmingham M.D.   On: 05/27/2024 14:40   DG Shoulder Right Result Date: 05/27/2024 CLINICAL DATA:  Chest pain following fall. EXAM: CHEST - 2 VIEW; RIGHT SHOULDER - 2+ VIEW COMPARISON:  05/18/2024. FINDINGS: Chest: The heart size and mediastinal contours are within normal limits. There is atherosclerotic calcification of the aorta. Scattered airspace opacities are noted in the right lung. No effusion or pneumothorax is seen. Sternotomy wires are present over the midline. Degenerative changes are noted in the thoracic spine. No acute osseous abnormality. Right shoulder: There is no evidence of acute fracture or dislocation. Moderate  degenerative changes are noted at the acromioclavicular joint. The humeral head is high riding which may be associated with chronic rotator cuff tear. IMPRESSION: 1. Patchy airspace disease in the right lung, concerning for pneumonia. 2. No acute fracture or dislocation at the right shoulder. 3. Moderate degenerative changes at the glenohumeral and acromioclavicular joints. 4. High riding humeral head on the right which may be associated with chronic rotator cuff tear. Electronically Signed   By: Leita Birmingham M.D.   On: 05/27/2024 14:40   CT Angio Chest PE W/Cm &/Or Wo Cm Result Date: 05/19/2024 CLINICAL DATA:  Right lower leg pain. Brought in by EMS from home placed on 2 L of oxygen  at night. PE suspected. Chest pain. EXAM: CT ANGIOGRAPHY CHEST WITH CONTRAST TECHNIQUE: Multidetector CT imaging of the chest was performed using the standard protocol during bolus administration of intravenous contrast. Multiplanar CT image reconstructions and MIPs were obtained to evaluate the vascular anatomy. RADIATION DOSE REDUCTION: This exam was performed according to the departmental dose-optimization program which includes automated exposure control, adjustment of the mA and/or kV according to patient size and/or use of iterative reconstruction technique. CONTRAST:  75mL OMNIPAQUE  IOHEXOL  350 MG/ML SOLN COMPARISON:  Radiographs 05/18/2024 and CT 03/17/2024 FINDINGS: Cardiovascular: Negative for pulmonary embolism. Advanced coronary artery calcification. Aortic atherosclerotic calcification. No pericardial effusion. Sternotomy and CABG. Mediastinum/Nodes: Trachea is unremarkable. Unremarkable esophagus. No thoracic adenopathy. Lungs/Pleura: Mild diffuse bronchial wall thickening. Scattered centrilobular micro nodules in the right upper lobe. No focal consolidation, pleural effusion, or pneumothorax. Upper Abdomen: No acute abnormality. Musculoskeletal: No acute fracture. Review of the MIP images confirms the above findings.  IMPRESSION:  1. Negative for pulmonary embolism. 2. Bronchial wall thickening and scattered centrilobular micro nodules compatible with airway infection/inflammation. 3. Advanced coronary artery calcification. 4. Aortic Atherosclerosis (ICD10-I70.0). Electronically Signed   By: Norman Gatlin M.D.   On: 05/19/2024 01:32   US  Venous Img Lower Unilateral Right Result Date: 05/19/2024 CLINICAL DATA:  Right leg pain. EXAM: RIGHT LOWER EXTREMITY VENOUS DOPPLER ULTRASOUND TECHNIQUE: Gray-scale sonography with graded compression, as well as color Doppler and duplex ultrasound were performed to evaluate the lower extremity deep venous systems from the level of the common femoral vein and including the common femoral, femoral, profunda femoral, popliteal and calf veins including the posterior tibial, peroneal and gastrocnemius veins when visible. The superficial great saphenous vein was also interrogated. Spectral Doppler was utilized to evaluate flow at rest and with distal augmentation maneuvers in the common femoral, femoral and popliteal veins. COMPARISON:  None Available. FINDINGS: Contralateral Common Femoral Vein: Respiratory phasicity is normal and symmetric with the symptomatic side. No evidence of thrombus. Normal compressibility. Common Femoral Vein: No evidence of thrombus. Normal compressibility, respiratory phasicity and response to augmentation. Saphenofemoral Junction: No evidence of thrombus. Normal compressibility and flow on color Doppler imaging. Profunda Femoral Vein: No evidence of thrombus. Normal compressibility and flow on color Doppler imaging. Femoral Vein: No evidence of thrombus. Normal compressibility, respiratory phasicity and response to augmentation. Popliteal Vein: No evidence of thrombus. Normal compressibility, respiratory phasicity and response to augmentation. Calf Veins: No evidence of thrombus. Normal compressibility and flow on color Doppler imaging. Superficial Great Saphenous  Vein: No evidence of thrombus. Normal compressibility. Venous Reflux:  None. Other Findings:  None. IMPRESSION: No evidence of deep venous thrombosis of the RIGHT lower extremity. Electronically Signed   By: Suzen Dials M.D.   On: 05/19/2024 00:37   DG Chest 1 View Result Date: 05/19/2024 CLINICAL DATA:  Chest pain EXAM: CHEST  1 VIEW COMPARISON:  03/29/2024 FINDINGS: Sternotomy and CABG. Stable cardiomediastinal silhouette. Coronary stenting. Bronchial wall thickening suspicious for bronchitis. Left basilar atelectasis. Otherwise no focal consolidation. No pleural effusion or pneumothorax. No displaced rib fractures. IMPRESSION: 1. Bronchial wall thickening suspicious for bronchitis. Electronically Signed   By: Norman Gatlin M.D.   On: 05/19/2024 00:13   DG Tibia/Fibula Right Result Date: 05/18/2024 CLINICAL DATA:  Right lower leg pain EXAM: RIGHT TIBIA AND FIBULA - 2 VIEW COMPARISON:  None Available. FINDINGS: No acute fracture or dislocation. There is no evidence of arthropathy or other focal bone abnormality. Soft tissues are unremarkable. Peripheral vascular atherosclerosis. IMPRESSION: No acute fracture or dislocation. Electronically Signed   By: Rogelia Myers M.D.   On: 05/18/2024 22:34    ECHO as above  TELEMETRY reviewed by me 06/13/2024: atrial fibrillation, rate 60-70s  EKG reviewed by me: atrial fibrillation, rate 80 bpm  Data reviewed by me 06/13/2024: last 24h vitals tele labs imaging I/O ED provider note, admission H&P.  Principal Problem:   Syncope Active Problems:   HLD (hyperlipidemia)   Chronic combined systolic (congestive) and diastolic (congestive) heart failure (HCC)   HTN (hypertension)   CAD (coronary artery disease)   Chest pain   Overweight (BMI 25.0-29.9)   Atrial fibrillation, chronic (HCC)   COPD (chronic obstructive pulmonary disease) (HCC)   Anxiety and depression   Bipolar disorder (HCC)    ASSESSMENT AND PLAN:  Lawrence [name] is a 68 y.o. male   with a past medical history of coronary artery disease s/p CABG, multiple stents, ischemic cardiomyopathy, chronic HFrEF (improved EF 30% to  50%), hypertension, hyperlipidemia, paroxsymal atrial fibrillation, OSA, COPD (on 2L O2), type 2 diabetes mellitus who presented to the ED on 06/09/2024 for syncope and chest pain. Prior to syncope event patient felt dizzy/lightheaded. Patient's home cardiac medications held due to orthostatic hypotension. Cardiology was consulted for further evaluation of cardiac medication regimen in setting of orthostasis.   # Coronary artery disease s/p CABG, multiple stents # Hyperlipidemia # Orthostatic hypotension, Syncope EKG without acute ischemic changes. Trops negative x2. Patient reports history of orthostatic hypotension and syncope episodes related due to BP. Noted to have positive orthostatics. Cardiac monitor on 08/16/23 that showed average HR 63, predominately sinus rhythm, 24% fibrillation burden, no other significant arrhythmias.  -Continue atorvastatin  80 mg daily. -Continue home Imdur  120 mg daily. -Continue home Ranexa  1000 mg BID.  -Continue midodrine  2.5 mg TID. Wean when able. MAP > 65. -Metoprolol  succinate as stated below. -Patient chest pain resolved today, repeat EKG without acute ischemic changes and troponin negative. Continue with optimization of antianginals.  # Chronic HFrEF (improved EF 30% - 50%) # Ischemic cardiomyopathy Patient with hx of reduced EF 30-35% (07/2023), EF improved. Echo this admission with EF 45-50%, global hypokinesis, mild concentric LVH, grade I diastolic dysfunction. Patient appears euvolemic on exam.  -Continue metoprolol  succinate 25 mg daily.  -Resume home dapagliflozin  10 mg daily.  -Home Entresto , spironolactone , lasix  held due to orthostatic hypotension.  Recomend to resume GDMT at outpatient [facility] cardiology follow-up as BP improves.  # Paroxsymal atrial fibrillation  EKG and per tele rate controlled atrial  fibrillation -Continue home Eliquis  5 mg BID for stroke risk reduction. -Metoprolol  as stated above.  No further cardiac recommendations at this time. Cardiology will sign off. Please haiku with questions or re-engage if needed. Schedule follow up with primary cardiologist at [facility] (Dr. Italy Lee).   This patient's plan of care was discussed and created with Dr. Wilburn and he is in agreement.  Signed: Dorene Comfort, PA-C  06/13/2024, 1:00 PM [facility]

## 2024-06-13 NOTE — Plan of Care (Signed)
   Problem: Education: Goal: Knowledge of General Education information will improve Description: Including pain rating scale, medication(s)/side effects and non-pharmacologic comfort measures Outcome: Progressing   Problem: Health Behavior/Discharge Planning: Goal: Ability to manage health-related needs will improve Outcome: Progressing   Problem: Clinical Measurements: Goal: Will remain free from infection Outcome: Progressing Goal: Diagnostic test results will improve Outcome: Progressing Goal: Respiratory complications will improve Outcome: Progressing   Problem: Activity: Goal: Risk for activity intolerance will decrease Outcome: Progressing

## 2024-06-13 NOTE — Progress Notes (Signed)
 Heart Failure Stewardship Pharmacy Note  PCP: [name], MD PCP-Cardiologist: None  HPI: Lawrence [name] is a 68 y.o. male with CAD, CHF, AF, PSVT, HTN, HLD, T2DM, COPD on 2L O2, OSA, celiac disease, gout, bipolar disorder, pancreatitis who presented with dizziness, lightheadedness, and chest pain. On admission, BNP was 90.2, HS-troponin was 12. Chest x-ray noted no active disease. Positive symptomatic orthostatics on 06/10/24.   Pertinent cardiac history: CABG in 2002. TTE [id number] with LVEF 60-65%. NSTEMI in 08/2023 with no culprit identified and LIMA to LAD restenosed. Underwent many LHCs. Most recent 10/2022 with patent SVG to OM1 and no culprit lesions. TTE 07/2023 with LVEF 30-35%, G2DD, severe RV dysfunction, moderate MR, mild to moderate AR. TTE 05/2024 with LVEF improved to 45-50%, G1DD.   Pertinent Lab Values: Creatinine  Date Value Ref Range Status  03/07/2015 0.84 mg/dL Final    Comment:    9.38-8.75 NOTE: New Reference Range  01/22/15    Creatinine, Ser  Date Value Ref Range Status  06/13/2024 1.33 (H) 0.61 - 1.24 mg/dL Final   BUN  Date Value Ref Range Status  06/13/2024 24 (H) 8 - 23 mg/dL Final  89/81/7975 19 8 - 27 mg/dL Final  95/78/7983 9 mg/dL Final    Comment:    3-79 NOTE: New Reference Range  01/22/15    Potassium  Date Value Ref Range Status  06/13/2024 4.8 3.5 - 5.1 mmol/L Final  03/07/2015 3.9 mmol/L Final    Comment:    3.5-5.1 NOTE: New Reference Range  01/22/15    Sodium  Date Value Ref Range Status  06/13/2024 136 135 - 145 mmol/L Final  09/03/2023 143 134 - 144 mmol/L Final  03/07/2015 143 mmol/L Final    Comment:    135-145 NOTE: New Reference Range  01/22/15    B Natriuretic Peptide  Date Value Ref Range Status  06/10/2024 90.2 0.0 - 100.0 pg/mL Final    Comment:    Performed at [facility], [address] [place], [place] [address]   Magnesium   Date Value Ref Range Status  06/12/2024 2.1 1.7 - 2.4 mg/dL  Final    Comment:    Performed at [facility], [address] [place], [place] [address]  05/24/2014 1.9 mg/dL Final    Comment:    8.1-7.5 THERAPEUTIC RANGE: 4-7 mg/dL TOXIC: > 10 mg/dL  -----------------------    Hemoglobin A1C  Date Value Ref Range Status  02/01/2024 5.8  Final    Comment:    eAG 120 @[facility]    TSH  Date Value Ref Range Status  12/28/2022 1.035 0.350 - 4.500 uIU/mL Final    Comment:    Performed by a 3rd Generation assay with a functional sensitivity of <=0.01 uIU/mL. Performed at [facility], [address] [place], [place] [address]     Vital Signs:  Temp:  [97.4 F (36.3 C)-98.2 F (36.8 C)] 97.4 F (36.3 C) (07/29 1027) Pulse Rate:  [59-86] 78 (07/29 1027) Cardiac Rhythm: Atrial fibrillation (07/29 0800) Resp:  [16-18] 16 (07/29 1027) BP: (102-126)/(67-91) 110/77 (07/29 1027) SpO2:  [94 %-97 %] 94 % (07/29 0751) Weight:  [80.8 kg (178 lb 2.1 oz)] 80.8 kg (178 lb 2.1 oz) (07/29 0500)  Intake/Output Summary (Last 24 hours) at 06/13/2024 1031 Last data filed at 06/13/2024 0422 Gross per 24 hour  Intake 240 ml  Output 925 ml  Net -685 ml    Current Heart Failure Medications:  Loop diuretic: none Beta-Blocker: metoprolol  25  mg BID ACEI/ARB/ARNI: none MRA: none SGLT2i: none Other: Imdur  120 mg daily  Prior to admission Heart Failure Medications:  Loop diuretic: furosemide  20 mg prn Beta-Blocker: carvedilol  3.125 mg BID ACEI/ARB/ARNI: Entresto  24-26 mg BID MRA: spironolactone  25 mg daily SGLT2i: Farxiga  10 mg daily Other: amlodipine  10 mg daily, isosorbide  ER 30 mg daily  Assessment: 1. Acute on chronic systolic heart failure (LVEF 45-50%)  , due to ICM. NYHA class II symptoms.  -Symptoms: Patient reported chest pain this morning and MD ordered EKG and troponin, both pending. Reports shortness of breath as well. Will follow results.  -Volume: Appears to be relatively euvolemic on exam. No LEE. JVP mildly elevated.   -Hemodynamics: BP is variable but generally WNL after holding BP meds. HR 60-80s. -BB: Due to orthostasis, COPD, and midodrine  use, PTA carvedilol  was transitioned to metoprolol  succinate 25 mg daily.  -ACEI/ARB/ARNI: PTA Entresto  held with symptomatic orthostasis. Consider resuming outpatient -MRA: Spironolactone  held with symptomatic orthostasis. -SGLT2i: Farxiga  10 mg daily resumed this AM.  Plan: 1) Medication changes recommended at this time: -None. Will follow work-up for chest pain.  2) Patient assistance: -Pending  3) Education: - Patient has been educated on current HF medications and potential additions to HF medication regimen - Patient verbalizes understanding that over the next few months, these medication doses may change and more medications may be added to optimize HF regimen - Patient has been educated on basic disease state pathophysiology and goals of therapy  Medication Assistance / Insurance Benefits Check: Does the patient have prescription insurance?    Type of insurance plan:  Does the patient qualify for medication assistance through manufacturers or grants? Pending   Outpatient Pharmacy: Prior to admission outpatient pharmacy: [facility]      Please do not hesitate to reach out with questions or concerns,  Jaun Bash, PharmD, CPP, BCPS, [facility] Heart Failure Pharmacist  Phone - [phone] 06/13/2024 10:31 AM

## 2024-06-13 NOTE — Discharge Summary (Signed)
 Physician Discharge Summary   Patient: Lawrence [name] MRN: [id number]  DOB: 02-15-1956   Admit:     Date of Admission: 06/09/2024 Admitted from: home   Discharge: Date of discharge: 06/13/24 Disposition: Home health Condition at discharge: fair  CODE STATUS: DNR     Discharge Physician: Laneta Blunt, DO [facility]     PCP: [name], MD  Recommendations for Outpatient Follow-up:  Follow up with PCP [name], MD in 1-2 weeks or with cardiology     Discharge Instructions     Diet - low sodium heart healthy   Complete by: As directed    Increase activity slowly   Complete by: As directed          Discharge Diagnoses: Principal Problem:   Syncope Active Problems:   CAD (coronary artery disease)   Chest pain   Chronic combined systolic (congestive) and diastolic (congestive) heart failure (HCC)   HLD (hyperlipidemia)   COPD (chronic obstructive pulmonary disease) (HCC)   HTN (hypertension)   Atrial fibrillation, chronic (HCC)   Anxiety and depression   Bipolar disorder (HCC)   Overweight (BMI 25.0-29.9)     Hospital course / significant events:   HPI: Lawrence [name] is a 68 y.o. male with medical history significant of f CAD (s/p of stent x 4 and CABG 2016), HFrEF with EF 30-35%, a fib on Eliquis , PSVT,  HTN, HLD, DM, COPD on 2L O2, OSA not using CPAP, dCHF, anxiety, celiac disease, pancreatitis, who presents with syncope and chest pain -  Patient states that he started feeling dizzy and lightheaded 07/25 around 8:00 PM, then passed out briefly. He reports chest pain, which is located in the front chest, constant, sharp, pleuritic, 9 out of 10 in severity, nonradiating. Associated with mild SOB and mild dry cough.   recently hospitalized from 7/14 - 7/17 due to pneumonia and COPD exacerbation.   07/25: to ED. Troponin 12. Imaging neg. Admitted to hospitalist.  07/26: (+)orthostatics, Echo shows improved EF from prior, still  having dizziness and unsteadiness, will hold entresto  (as well as previously held meds) and see how he is doing tomorrow  07/27: still dizzy but some improved from yesterday. Recurrent chest pain this afternoon, repeat ACS w/u. BP soft, starting midodrine  - suspect angina / COPD but if ACS r/o and symptoms persist will engage cardiology team  07/28: ACS r/o, persistent orthostatic hypotension, cardiology consulted - continue statin, imdur  120, ranexa , midodrine  2.5 mg tid wean as able, metoprolol  transitioned to succinate 25 mg daily, and continue hold dapagliflozin , spironolactone , lasix   restart outpatient as able.  07/29: chest pain again this morning, cardiology repeated w/u and cleared for discharge.    Consultants:  Cardiology  Procedures/Surgeries: none      ASSESSMENT & PLAN:   Syncope Etiology is not clear but suspect orthostatic/vasovagal even (though orthostatics here initially negative, were positive today), possible Afib RVR then self-converted. Suspect he has concussion also complicating dizziness with position changes   CT head negative for acute intracranial abnormalities.   No focal neurodeficit on physical examination, low suspicions for stroke.   CHF appears compensated PE ruled out  No infection  Troponins neg/flat no ACS Orthostatics positive  Echo is showing improved function compared to alst available echo 07/2023 - EF 45-50% (improved from 30-35% w/ global hypokinesis), grade 1 diastolic dysfunction  fall precaution    Chest pain / angina CAD Hx essential HTN but has been low here  HLD  CAD s/p CABG HFrEF not in acute exacerbation  Cardiology consult here - have cleared for discharge  Unable to ideally administer nitroglycerin  d/t low BP Continue meds:  Atorvastatin  80 Imdur  120 daily Ranexa  1000 bid Midodrine  2.5 mg tid wean as able Metoprolol  transitioned to succinate 25 mg daily dapagliflozin  10 mg daily  HOLD Entresto , spironolactone , lasix  to  restart outpatient as able.  follow w/ [facility] cardiology outpatient   Chronic pain Opiate dependence/tolerance  PDMP reviewed, Oxy-APAP 10-325 #180 x30d = 6 pills per day = 20 mg q8h or 10 mg q4h  Continue home dose   Paroxysmal Afib Eliquis  Beta blocker   COPD (chronic obstructive pulmonary disease) (HCC): Stable Breztri  and as needed Mucinex  Albuterol  as needed    Diabetes Type 2 Resume home meds   Anxiety and depression Trintellix , trazodone  Xanax  prn    Bipolar disorder (HCC) Continue lithium   GERD PPI, H2B    overweight based on BMI: Body mass index is 27.9 kg/m.Lawrence [name] Significantly low or high BMI is associated with higher medical risk.  Underweight - under 18  overweight - 25 to 29 obese - 30 or more Class 1 obesity: BMI of 30.0 to 34 Class 2 obesity: BMI of 35.0 to 39 Class 3 obesity: BMI of 40.0 to 49 Super Morbid Obesity: BMI 50-59 Super-super Morbid Obesity: BMI 60+ Healthy nutrition and physical activity advised as adjunct to other disease management and risk reduction treatments              Discharge Instructions  Allergies as of 06/13/2024       Reactions   Demerol  [meperidine Hcl]    Demerol [meperidine] Hives   Sulfa Antibiotics Hives   Empagliflozin  Other (See Comments), Hives   Perineal pain Other Reaction(s): Other (See Comments)   Morphine  Sulfate Nausea And Vomiting, Rash, Other (See Comments)   Pt states that he is only allergic to the tablet form of this medication.          Medication List     PAUSE taking these medications    sacubitril -valsartan  24-26 MG Wait to take this until your doctor or other care provider tells you to start again. Commonly known as: ENTRESTO  Take 1 tablet by mouth 2 (two) times daily.   spironolactone  25 MG tablet Wait to take this until your doctor or other care provider tells you to start again. Commonly known as: ALDACTONE  Take 1 tablet (25 mg total) by mouth daily.       STOP  taking these medications    amLODipine  10 MG tablet Commonly known as: NORVASC    carvedilol  3.125 MG tablet Commonly known as: COREG    furosemide  20 MG tablet Commonly known as: LASIX    gabapentin  300 MG capsule Commonly known as: NEURONTIN    metoCLOPramide  10 MG tablet Commonly known as: REGLAN        TAKE these medications    Accu-Chek Guide Control Liqd Use with blood glucose monitor as directed   Accu-Chek Guide test strip Generic drug: glucose blood Use as instructed to check sugar daily for type 2 diabetes.   Accu-Chek Guide w/Device Kit Use to check blood sugars as directed   Accu-Chek Softclix Lancets lancets Use as instructed to check sugar daily for type 2 diabetes.   albuterol  (2.5 MG/3ML) 0.083% nebulizer solution Commonly known as: PROVENTIL  Take 3 mLs (2.5 mg total) by nebulization every 6 (six) hours as needed for wheezing or shortness of breath.   allopurinol  300 MG tablet Commonly known as: ZYLOPRIM   TAKE 1 TABLET BY MOUTH TWICE A DAY   ALPRAZolam  1 MG tablet Commonly known as: XANAX  TAKE 1 TABLET BY MOUTH 3 TIMES A DAY AS NEEDED   aluminum -magnesium  hydroxide-simethicone  200-200-20 MG/5ML Susp Commonly known as: MAALOX Take 30 mLs by mouth 3 (three) times daily as needed.   apixaban  5 MG Tabs tablet Commonly known as: Eliquis  Take 1 tablet (5 mg total) by mouth 2 (two) times daily.   atorvastatin  80 MG tablet Commonly known as: LIPITOR  TAKE 1 TABLET BY MOUTH AT BEDTIME   Breztri  Aerosphere 160-9-4.8 MCG/ACT Aero inhaler Generic drug: budesonide -glycopyrrolate -formoterol  INHALE 2 PUFFS BY MOUTH INTO THE LUNGS 2TIMES DAILY   cetirizine  10 MG tablet Commonly known as: ZYRTEC  Take 1 tablet (10 mg total) by mouth at bedtime.   famotidine  20 MG tablet Commonly known as: PEPCID  Take 1 tablet (20 mg total) by mouth 2 (two) times daily.   Farxiga  10 MG Tabs tablet Generic drug: dapagliflozin  propanediol TAKE 1 TABLET BY MOUTH DAILY    iron  polysaccharides 150 MG capsule Commonly known as: Ferrex 150 Take one capsul every Monday, Wednesday, and Friday   isosorbide  mononitrate 120 MG 24 hr tablet Commonly known as: IMDUR  Take 1 tablet (120 mg total) by mouth daily.   lithium  carbonate 150 MG capsule Take 150 mg by mouth 2 (two) times daily.   metFORMIN  500 MG 24 hr tablet Commonly known as: GLUCOPHAGE -XR TAKE 1 TABLET BY MOUTH DAILY WITH EVENING MEAL   methocarbamol  500 MG tablet Commonly known as: ROBAXIN  TAKE 1 TABLET BY MOUTH EVERY 8 HOURS AS NEEDED FOR MUSCLE SPASMS   metoprolol  succinate 25 MG 24 hr tablet Commonly known as: TOPROL -XL Take 1 tablet (25 mg total) by mouth daily. Start taking on: June 14, 2024 What changed: how much to take   midodrine  2.5 MG tablet Commonly known as: PROAMATINE  Take 1 tablet (2.5 mg total) by mouth 3 (three) times daily with meals.   nitroGLYCERIN  0.4 MG SL tablet Commonly known as: NITROSTAT  Place 1 tablet (0.4 mg total) under the tongue every 5 (five) minutes as needed for chest pain (Do not take if blood pressure is low, systolic BP<110).   omega-3 acid ethyl esters 1 g capsule Commonly known as: LOVAZA  TAKE 4 CAPSULES (4 GRAMS TOTAL) BY MOUTHDAILY   oxyCODONE -acetaminophen  10-325 MG tablet Commonly known as: PERCOCET TAKE 1 TABLET BY MOUTH EVERY 4 HOURS AS NEEDED FOR PAIN   OXYGEN  Inhale 2 L into the lungs at bedtime as needed (for shortness of breath).   pantoprazole  40 MG tablet Commonly known as: PROTONIX  TAKE 1 TABLET BY MOUTH 2 TIMES DAILY (30MINUTES BEFORE A MEAL)   ranolazine  1000 MG SR tablet Commonly known as: RANEXA  Take 1 tablet (1,000 mg total) by mouth 2 (two) times daily.   sennosides-docusate sodium  8.6-50 MG tablet Commonly known as: SENOKOT-S Take 1 tablet by mouth daily as needed for constipation.   traZODone  150 MG tablet Commonly known as: DESYREL  Take 1 tablet (150 mg total) by mouth at bedtime as needed for sleep.    Trintellix  10 MG Tabs tablet Generic drug: vortioxetine  HBr Take 10 mg by mouth daily.   Vitamin B-12 1000 MCG Subl Place 1 tablet under the tongue daily.         Follow-up Information     [name], Nancyann BRAVO, MD Follow up.   Specialty: Family Medicine Why: hospital follow up Contact information: [address] [place] [address] [phone]  Jama Margery ORN, MD. Go in 1 week(s).   Specialty: Cardiology Contact information: [address] [place] [place] [address] [phone]         [facility]. Go on 06/19/2024.   Specialty: Cardiology Why: Hospital Follow-Up 06/19/24 @ 2:00PM Please bring all medications to follow-up appointment with you [facility], [address] Second [address] Free Valet Parking at the [employer] information: [address] [place]  [address] [phone]                Allergies  Allergen Reactions   Demerol  [Meperidine Hcl]    Demerol [Meperidine] Hives   Sulfa Antibiotics Hives   Empagliflozin  Other (See Comments) and Hives    Perineal pain  Other Reaction(s): Other (See Comments)   Morphine  Sulfate Nausea And Vomiting, Rash and Other (See Comments)    Pt states that he is only allergic to the tablet form of this medication.       Subjective: pt reports some chest discomfort this morning at sternum, worse w/ movement. No SOB, still some dizziness   Discharge Exam: BP 108/74 (BP Location: Left Arm)   Pulse 69   Temp (!) 97.4 F (36.3 C)   Resp 12   Ht 5' 7 (1.702 m)   Wt 80.8 kg   SpO2 97%   BMI 27.90 kg/m  General: Pt is alert, awake, not in acute distress Cardiovascular: reg rate, irregular rhythm Chest: reports pain is reproduced by pressure on R side of sternum Respiratory: dimished breath sounds but CTA  Abdominal: Soft, NT, ND, bowel sounds + Extremities: no edema, no cyanosis     The  results of significant diagnostics from this hospitalization (including imaging, microbiology, ancillary and laboratory) are listed below for reference.     Microbiology: No results found for this or any previous visit (from the past 240 hours).   Labs: BNP (last 3 results) Recent Labs    05/27/24 1403 05/30/24 0323 06/10/24 0319  BNP 412.1* 483.4* 90.2   Basic Metabolic Panel: Recent Labs  Lab 06/09/24 2215 06/10/24 0319 06/12/24 0003 06/13/24 0440  NA 139 135  --  136  K 4.6 4.0 4.6 4.8  CL 101 100  --  98  CO2 28 29  --  32  GLUCOSE 113* 103*  --  133*  BUN 28* 26*  --  24*  CREATININE 1.12 1.15  --  1.33*  CALCIUM  9.7 9.4  --  9.8  MG  --   --  2.1  --    Liver Function Tests: Recent Labs  Lab 06/09/24 2215  AST 14*  ALT 18  ALKPHOS 48  BILITOT 1.4*  PROT 6.1*  ALBUMIN 3.4*   No results for input(s): LIPASE, AMYLASE in the last 168 hours. No results for input(s): AMMONIA in the last 168 hours. CBC: Recent Labs  Lab 06/09/24 2025 06/10/24 0319 06/13/24 0440  WBC 8.6 7.3 9.5  NEUTROABS 5.7  --   --   HGB 17.1* 16.5 16.5  HCT 52.4* 50.8 51.0  MCV 87.3 88.8 89.5  PLT 180 188 172   Cardiac Enzymes: No results for input(s): CKTOTAL, CKMB, CKMBINDEX, TROPONINI in the last 168 hours. BNP: Invalid input(s): POCBNP CBG: Recent Labs  Lab 06/12/24 1145 06/12/24 1542 06/12/24 1956 06/13/24 0754 06/13/24 1134  GLUCAP 174* 176* 120* 151* 172*   D-Dimer No results for input(s): DDIMER in the last 72 hours. Hgb  A1c No results for input(s): HGBA1C in the last 72 hours. Lipid Profile No results for input(s): CHOL, HDL, LDLCALC, TRIG, CHOLHDL, LDLDIRECT in the last 72 hours. Thyroid  function studies No results for input(s): TSH, T4TOTAL, T3FREE, THYROIDAB in the last 72 hours.  Invalid input(s): FREET3 Anemia work up No results for input(s): VITAMINB12, FOLATE, FERRITIN, TIBC, IRON , RETICCTPCT in  the last 72 hours. Urinalysis    Component Value Date/Time   COLORURINE YELLOW (A) 05/29/2024 0942   APPEARANCEUR CLEAR (A) 05/29/2024 0942   APPEARANCEUR Clear 05/25/2019 1554   LABSPEC 1.013 05/29/2024 0942   LABSPEC 1.012 03/07/2015 2143   PHURINE 7.0 05/29/2024 0942   GLUCOSEU >=500 (A) 05/29/2024 0942   GLUCOSEU Negative 03/07/2015 2143   HGBUR SMALL (A) 05/29/2024 0942   BILIRUBINUR NEGATIVE 05/29/2024 0942   BILIRUBINUR Negative 05/25/2019 1554   BILIRUBINUR Negative 03/07/2015 2143   KETONESUR 20 (A) 05/29/2024 0942   PROTEINUR NEGATIVE 05/29/2024 0942   UROBILINOGEN 0.2 03/26/2017 0907   NITRITE NEGATIVE 05/29/2024 0942   LEUKOCYTESUR NEGATIVE 05/29/2024 0942   LEUKOCYTESUR Negative 03/07/2015 2143   Sepsis Labs Recent Labs  Lab 06/09/24 2025 06/10/24 0319 06/13/24 0440  WBC 8.6 7.3 9.5   Microbiology No results found for this or any previous visit (from the past 240 hours). Imaging ECHOCARDIOGRAM COMPLETE Result Date: 06/10/2024    ECHOCARDIOGRAM REPORT   Patient Name:   ZAMAR ODWYER [facility] Date of Exam: 06/10/2024 Medical Rec #:  [id number]       Height:       67.0 in Accession #:    [id number]      Weight:       178.1 lb Date of Birth:  [birth year]       BSA:          1.925 m Patient Age:    68 years        BP:           133/89 mmHg Patient Gender: M               HR:           76 bpm. Exam Location:  [facility] Procedure: 2D Echo, Cardiac Doppler, Color Doppler and Intracardiac            Opacification Agent (Both Spectral and Color Flow Doppler were            utilized during procedure). Indications:     Syncope R55  History:         Patient has prior history of Echocardiogram examinations, most                  recent 08/08/2023.  Sonographer:     Thedora Louder RDCS, FASE Referring Phys:  [id number] XILIN NIU Diagnosing Phys: Cara JONETTA Lovelace MD  Sonographer Comments: Technically difficult study due to poor echo windows and suboptimal apical window. Image acquisition challenging due  to patient body habitus and Image acquisition challenging due to respiratory motion. IMPRESSIONS  1. Left ventricular ejection fraction, by estimation, is 45 to 50%. The left ventricle has mildly decreased function. The left ventricle demonstrates global hypokinesis. There is mild concentric left ventricular hypertrophy. Left ventricular diastolic parameters are consistent with Grade I diastolic dysfunction (impaired relaxation).  2. Right ventricular systolic function is normal. The right ventricular size is normal.  3. The mitral valve is normal in structure. No evidence of mitral valve regurgitation.  4. The aortic valve is normal in structure. Aortic valve regurgitation is  not visualized. FINDINGS  Left Ventricle: Left ventricular ejection fraction, by estimation, is 45 to 50%. The left ventricle has mildly decreased function. The left ventricle demonstrates global hypokinesis. Definity  contrast agent was given IV to delineate the left ventricular  endocardial borders. Strain was performed and the global longitudinal strain is indeterminate. The left ventricular internal cavity size was normal in size. There is mild concentric left ventricular hypertrophy. Left ventricular diastolic parameters are  consistent with Grade I diastolic dysfunction (impaired relaxation). Right Ventricle: The right ventricular size is normal. No increase in right ventricular wall thickness. Right ventricular systolic function is normal. Left Atrium: Left atrial size was normal in size. Right Atrium: Right atrial size was normal in size. Pericardium: There is no evidence of pericardial effusion. Mitral Valve: The mitral valve is normal in structure. No evidence of mitral valve regurgitation. Tricuspid Valve: The tricuspid valve is normal in structure. Tricuspid valve regurgitation is not demonstrated. Aortic Valve: The aortic valve is normal in structure. Aortic valve regurgitation is not visualized. Aortic regurgitation PHT measures  747 msec. Aortic valve peak gradient measures 6.2 mmHg. Pulmonic Valve: The pulmonic valve was normal in structure. Pulmonic valve regurgitation is not visualized. Aorta: The ascending aorta was not well visualized. IAS/Shunts: No atrial level shunt detected by color flow Doppler. Additional Comments: 3D was performed not requiring image post processing on an independent workstation and was indeterminate.  LEFT VENTRICLE PLAX 2D LVIDd:         2.80 cm     Diastology LVIDs:         2.20 cm     LV e' medial:    10.80 cm/s LV PW:         1.40 cm     LV E/e' medial:  6.3 LV IVS:        1.30 cm     LV e' lateral:   13.10 cm/s LVOT diam:     2.00 cm     LV E/e' lateral: 5.2 LV SV:         53 LV SV Index:   28 LVOT Area:     3.14 cm  LV Volumes (MOD) LV vol d, MOD A4C: 74.4 ml LV vol s, MOD A4C: 37.1 ml LV SV MOD A4C:     74.4 ml RIGHT VENTRICLE RV Basal diam:  2.90 cm RV S prime:     7.62 cm/s LEFT ATRIUM           Index        RIGHT ATRIUM           Index LA diam:      5.10 cm 2.65 cm/m   RA Area:     13.20 cm LA Vol (A2C): 82.8 ml 43.01 ml/m  RA Volume:   28.10 ml  14.60 ml/m LA Vol (A4C): 47.7 ml 24.78 ml/m  AORTIC VALVE                 PULMONIC VALVE AV Area (Vmax): 2.33 cm     PV Vmax:        0.67 m/s AV Vmax:        124.00 cm/s  PV Peak grad:   1.8 mmHg AV Peak Grad:   6.2 mmHg     RVOT Peak grad: 2 mmHg LVOT Vmax:      91.80 cm/s LVOT Vmean:     60.800 cm/s LVOT VTI:       0.169 m AI PHT:  747 msec  AORTA Ao Root diam: 3.70 cm Ao Asc diam:  3.20 cm MITRAL VALVE MV Area (PHT): 4.29 cm    SHUNTS MV Decel Time: 177 msec    Systemic VTI:  0.17 m MV E velocity: 68.10 cm/s  Systemic Diam: 2.00 cm Cara JONETTA Lovelace MD Electronically signed by Cara JONETTA Lovelace MD Signature Date/Time: 06/10/2024/3:38:28 PM    Final    CT Angio Chest Pulmonary Embolism (PE) W or WO Contrast Result Date: 06/10/2024 CLINICAL DATA:  Syncope/presyncope, cerebrovascular cause suspected EXAM: CT ANGIOGRAPHY CHEST WITH CONTRAST  TECHNIQUE: Multidetector CT imaging of the chest was performed using the standard protocol during bolus administration of intravenous contrast. Multiplanar CT image reconstructions and MIPs were obtained to evaluate the vascular anatomy. RADIATION DOSE REDUCTION: This exam was performed according to the departmental dose-optimization program which includes automated exposure control, adjustment of the mA and/or kV according to patient size and/or use of iterative reconstruction technique. CONTRAST:  OMNIPAQUE  IOHEXOL  350 MG/ML SOLN COMPARISON:  05/27/2024 FINDINGS: Cardiovascular: Adequate opacification of the pulmonary arterial tree. No intraluminal filling defect identified to suggest acute pulmonary embolism. Central pulmonary arteries are mildly enlarged in keeping with changes of pulmonary arterial hypertension. Status post coronary artery bypass grafting. Cardiac size is mildly enlarged, stable. No pericardial effusion. Moderate atherosclerotic calcification within the thoracic aorta. No aortic aneurysm. Mediastinum/Nodes: No enlarged mediastinal, hilar, or axillary lymph nodes. Thyroid  gland, trachea, and esophagus demonstrate no significant findings. Small hiatal hernia Lungs/Pleura: Previously noted extensive pulmonary infiltrate throughout the right lung has significantly improved in the interval with mild residual peribronchial and centrilobular nodular infiltrate. Mild emphysema. Stable bronchial wall thickening in keeping with airway inflammation. Scattered airway impaction noted within right lower lobe. No central obstructing mass. No pneumothorax or pleural effusion. Upper Abdomen: No acute abnormality. Musculoskeletal: No acute bone abnormality. No lytic or blastic bone lesion. Osseous structures are age appropriate. Review of the MIP images confirms the above findings. IMPRESSION: 1. No pulmonary embolism. 2. Mild enlargement of the central pulmonary arteries in keeping with changes of  pulmonary arterial hypertension. 3. Mild cardiomegaly. Status post coronary artery bypass grafting. 4. Improved airspace infiltrate throughout the right lung in keeping a resolving infectious process 5. Emphysema.  Airway inflammation. Aortic Atherosclerosis (ICD10-I70.0) and Emphysema (ICD10-J43.9). Electronically Signed   By: Dorethia Molt M.D.   On: 06/10/2024 02:56      Time coordinating discharge: over 30 minutes  SIGNED:  Ibrahem Volkman DO [facility]

## 2024-06-14 ENCOUNTER — Telehealth: Payer: Self-pay

## 2024-06-14 ENCOUNTER — Ambulatory Visit
Admission: RE | Admit: 2024-06-14 | Discharge: 2024-06-14 | Disposition: A | Discharge status: Home / Self Care | Source: Ambulatory Visit | Attending: Neurology | Admitting: Neurology

## 2024-06-14 DIAGNOSIS — G3184 Mild cognitive impairment, so stated: Secondary | ICD-10-CM | POA: Insufficient documentation

## 2024-06-14 NOTE — Transitions of Care (Post Inpatient/ED Visit) (Unsigned)
   06/14/2024  Name: Lawrence [name] MRN: [id number] DOB: 1956-07-29  Today's TOC FU Call Status: Today's TOC FU Call Status:: Unsuccessful Call (1st Attempt) Unsuccessful Call (1st Attempt) Date: 06/14/24  Attempted to reach the patient regarding the most recent Inpatient/ED visit.  Follow Up Plan: Additional outreach attempts will be made to reach the patient to complete the Transitions of Care (Post Inpatient/ED visit) call.   Signature Julian Lemmings, LPN [facility] Nurse Health Advisor Direct Dial [phone]

## 2024-06-15 ENCOUNTER — Telehealth: Payer: Self-pay

## 2024-06-15 NOTE — Telephone Encounter (Signed)
 That's fine

## 2024-06-15 NOTE — Telephone Encounter (Signed)
 Copied from CRM [id number]. Topic: Clinical - Home Health Verbal Orders >> Jun 15, 2024  3:27 PM Larissa RAMAN wrote: Caller/Agency: Alfredia KLEIN  [facility]  Callback Number: [phone] Service Requested: Physical Therapy. Also requesting a nurse evaluation Frequency: 1 x 7 weeks.   Any new concerns about the patient? No, patient recently discharged hospital

## 2024-06-15 NOTE — Telephone Encounter (Signed)
 Called to notify Lawrence [name] of verbal order approval

## 2024-06-15 NOTE — Transitions of Care (Post Inpatient/ED Visit) (Signed)
 06/15/2024  Name: Lawrence [name] MRN: [id number] DOB: Nov 19, 1955  Today's TOC FU Call Status: Today's TOC FU Call Status:: Successful TOC FU Call Completed Unsuccessful Call (1st Attempt) Date: 06/14/24 [facility] FU Call Complete Date: 06/15/24 Patient's Name and Date of Birth confirmed.  Transition Care Management Follow-up Telephone Call Date of Discharge: 06/13/24 Discharge Facility: [facility]) Type of Discharge: Inpatient Admission Primary Inpatient Discharge Diagnosis:: pneumonia How have you been since you were released from the hospital?: Better Any questions or concerns?: No  Items Reviewed: Did you receive and understand the discharge instructions provided?: Yes Medications obtained,verified, and reconciled?: Yes (Medications Reviewed) Any new allergies since your discharge?: No Dietary orders reviewed?: Yes Do you have support at home?: Yes People in Home [RPT]: grandchild(ren)  Medications Reviewed Today: Medications Reviewed Today     Reviewed by Emmitt Pan, LPN (Licensed Practical Nurse) on 06/15/24 at 1229  Med List Status: <None>   Medication Order Taking? Sig Documenting Provider Last Dose Status Informant  Accu-Chek Softclix Lancets lancets [id number] Yes Use as instructed to check sugar daily for type 2 diabetes. [name], MD  Active Self  albuterol  (PROVENTIL ) (2.5 MG/3ML) 0.083% nebulizer solution [id number] Yes Take 3 mLs (2.5 mg total) by nebulization every 6 (six) hours as needed for wheezing or shortness of breath. [name], MD  Active Self  allopurinol  (ZYLOPRIM ) 300 MG tablet [id number] Yes TAKE 1 TABLET BY MOUTH TWICE A DAY [name], MD  Active Self  ALPRAZolam  (XANAX ) 1 MG tablet [id number] Yes TAKE 1 TABLET BY MOUTH 3 TIMES A DAY AS NEEDED [name], MD  Active Self  aluminum -magnesium  hydroxide-simethicone  (MAALOX) 200-200-20 MG/5ML SUSP [id number] Yes Take 30 mLs by mouth 3 (three) times daily  as needed. [name], MD  Active Self  apixaban  (ELIQUIS ) 5 MG TABS tablet [id number] Yes Take 1 tablet (5 mg total) by mouth 2 (two) times daily. [name], MD  Active Self  atorvastatin  (LIPITOR ) 80 MG tablet [id number] Yes TAKE 1 TABLET BY MOUTH AT BEDTIME [name], MD  Active Self  Blood Glucose Calibration (ACCU-CHEK GUIDE CONTROL) [name] [id number] Yes Use with blood glucose monitor as directed [name], MD  Active Self  Blood Glucose Monitoring Suppl (ACCU-CHEK GUIDE) w/Device KIT [id number] Yes Use to check blood sugars as directed [name], MD  Active Self  BREZTRI  AEROSPHERE 160-9-4.8 MCG/ACT AERO inhaler [id number] Yes INHALE 2 PUFFS BY MOUTH INTO THE LUNGS 2TIMES DAILY [name], MD  Active Self  cetirizine  (ZYRTEC ) 10 MG tablet [id number] Yes Take 1 tablet (10 mg total) by mouth at bedtime. [name], MD  Active Self  Cyanocobalamin  (VITAMIN B-12) 1000 MCG SUBL [id number] Yes Place 1 tablet under the tongue daily. [provider]  Active Self  dapagliflozin  propanediol (FARXIGA ) 10 MG TABS tablet [id number] Yes TAKE 1 TABLET BY MOUTH DAILY [name], MD  Active Self  famotidine  (PEPCID ) 20 MG tablet [id number] Yes Take 1 tablet (20 mg total) by mouth 2 (two) times daily. [name], MD  Active Self  glucose blood (ACCU-CHEK GUIDE) test strip [id number] Yes Use as instructed to check sugar daily for type 2 diabetes. [name], MD  Active Self  iron  polysaccharides [facility] 150) 150 MG capsule [id number] Yes Take one capsul every Monday, Wednesday, and Friday [name], MD  Active Self  isosorbide  mononitrate (IMDUR ) 120 MG 24 hr tablet [id number] Yes Take 1 tablet (  120 mg total) by mouth daily. Von Bellis, MD  Active Self           Med Note BEVERLEE, JOYCE   Sat Jun 10, 2024 12:01 AM) Pt taking 60 mg  lithium  carbonate 150 MG capsule [id number] Yes Take 150 mg by mouth 2 (two) times daily. [provider]   Active Self  metFORMIN  (GLUCOPHAGE -XR) 500 MG 24 hr tablet [id number] Yes TAKE 1 TABLET BY MOUTH DAILY WITH EVENING MEAL [name], MD  Active Self  methocarbamol  (ROBAXIN ) 500 MG tablet [id number] Yes TAKE 1 TABLET BY MOUTH EVERY 8 HOURS AS NEEDED FOR MUSCLE SPASMS [name], MD  Active Self  metoprolol  succinate (TOPROL -XL) 25 MG 24 hr tablet [id number]  Take 1 tablet (25 mg total) by mouth daily.  Patient not taking: Reported on 06/15/2024   Alexander, Natalie, DO  Active   midodrine  (PROAMATINE ) 2.5 MG tablet [id number] Yes Take 1 tablet (2.5 mg total) by mouth 3 (three) times daily with meals. Alexander, Natalie, DO  Active   nitroGLYCERIN  (NITROSTAT ) 0.4 MG SL tablet [id number] Yes Place 1 tablet (0.4 mg total) under the tongue every 5 (five) minutes as needed for chest pain (Do not take if blood pressure is low, systolic BP<110). [name], MD  Active Self           Med Note HALLIE, [facility] N   Tue Mar 21, 2024 12:58 PM) Needs new order  omega-3 acid ethyl esters (LOVAZA ) 1 g capsule [id number] Yes TAKE 4 CAPSULES (4 GRAMS TOTAL) BY WARDEN [name], MD  Active Self  oxyCODONE -acetaminophen  (PERCOCET) 10-325 MG tablet [id number] Yes TAKE 1 TABLET BY MOUTH EVERY 4 HOURS AS NEEDED FOR PAIN [name], MD  Active Self  OXYGEN  [id number] Yes Inhale 2 L into the lungs at bedtime as needed (for shortness of breath). [provider]  Active Self  pantoprazole  (PROTONIX ) 40 MG tablet [id number] Yes TAKE 1 TABLET BY MOUTH 2 TIMES DAILY (30MINUTES BEFORE A MEAL) [name], MD  Active Self  ranolazine  (RANEXA ) 1000 MG SR tablet [id number] Yes Take 1 tablet (1,000 mg total) by mouth 2 (two) times daily. Darci Pore, MD  Active Self  sacubitril -valsartan  (ENTRESTO ) 24-26 MG [id number]  Take 1 tablet by mouth 2 (two) times daily.  Patient not taking: Reported on 06/15/2024   [provider]  Active Self  sennosides-docusate sodium  (SENOKOT-S)  8.6-50 MG tablet [id number] Yes Take 1 tablet by mouth daily as needed for constipation. [name], MD  Active Self  spironolactone  (ALDACTONE ) 25 MG tablet [id number]  Take 1 tablet (25 mg total) by mouth daily.  Patient not taking: Reported on 06/15/2024   [name], MD  Active Self  traZODone  (DESYREL ) 150 MG tablet [id number] Yes Take 1 tablet (150 mg total) by mouth at bedtime as needed for sleep. [name], MD  Active Self  TRINTELLIX  10 MG TABS tablet [id number] Yes Take 10 mg by mouth daily. [provider]  Active Self            Home Care and Equipment/Supplies: Were Home Health Services Ordered?: Yes Name of Home Health Agency:: [facility] Has Agency set up a time to come to your home?: Yes First Home Health Visit Date: 06/15/24 Any new equipment or medical supplies ordered?: NA  Functional Questionnaire: Do you need assistance with bathing/showering or dressing?: No Do you need assistance with meal preparation?: Yes Do you need assistance with eating?: No Do you  have difficulty maintaining continence: No Do you need assistance with getting out of bed/getting out of a chair/moving?: No Do you have difficulty managing or taking your medications?: No  Follow up appointments reviewed: PCP Follow-up appointment confirmed?: Yes Date of PCP follow-up appointment?: 06/23/24 Follow-up Provider: [facility] Follow-up appointment confirmed?: NA Do you need transportation to your follow-up appointment?: No Do you understand care options if your condition(s) worsen?: Yes-patient verbalized understanding    SIGNATURE Julian Lemmings, LPN [facility] Nurse Health Advisor Direct Dial [phone]

## 2024-06-15 NOTE — Progress Notes (Signed)
 This encounter was created in error - please disregard.

## 2024-06-16 ENCOUNTER — Telehealth: Payer: Self-pay

## 2024-06-16 NOTE — Telephone Encounter (Signed)
 Called and advised stephanie of approval.

## 2024-06-16 NOTE — Telephone Encounter (Signed)
 Copied from CRM [id number]. Topic: Clinical - Home Health Verbal Orders >> Jun 16, 2024 10:53 AM Nathanel BROCKS wrote: Caller/Agency: [facility] Callback Number: [phone] Service Requested: Occupational Therapy Frequency: 1 time a week for 6 weeks Any new concerns about the patient? No

## 2024-06-16 NOTE — Telephone Encounter (Signed)
 That's fine

## 2024-06-16 NOTE — Transitions of Care (Post Inpatient/ED Visit) (Signed)
 Transition of Care week 3  Visit Note  06/16/2024  Name: Lawrence [name] MRN: [id number]          DOB: 1956-08-31  Situation: Patient enrolled in [facility] 30-day program. Visit completed with patient by telephone.   Background:   Initial Transition Care Management Follow-up Telephone Call    Past Medical History:  Diagnosis Date   A-fib (HCC)    Anemia    Anginal pain (HCC)    Anxiety    Arthritis    Asthma    CAD (coronary artery disease)    a. 2002 CABGx2 (LIMA->LAD, VG->VG->OM1);  b. 09/2012 DES->OM;  c. 03/2015 PTCA of LAD [facility]) in setting of atretic LIMA; d. 05/2015 Cath [facility]): nonobs dzs; e. 06/2015 Cath ([facility]): LM nl, LAD 45p/d ISR, 50d, D1/2 small, LCX 50p/d ISR, OM1 70ost, 30 ISR, VG->OM1 50ost, 22m, LIMA->LAD 99p/d - atretic, RCA dom, nl; f.cath 10/16: 40-50%(FFR 0.90) pLAD, 75% (FFR 0.77) mLAD s/p PCI/DES, oRCA 40% (FFR0.95)   Cancer (HCC)    SKIN CANCER ON BACK   Celiac disease    Chronic diastolic CHF (congestive heart failure) (HCC)    a. 06/2009 Echo: EF 60-65%, Gr 1 DD, triv AI, mildly dil LA, nl RV.   COPD (chronic obstructive pulmonary disease) (HCC)    a. Chronic bronchitis and emphysema.   DDD (degenerative disc disease), lumbar    Diverticulosis    Dysrhythmia    Essential hypertension    GERD (gastroesophageal reflux disease)    History of hiatal hernia    History of kidney stones    H/O   History of tobacco abuse    a. Quit 2014.   Myocardial infarction [facility]) 2002   4 STENTS   Pancreatitis    PSVT (paroxysmal supraventricular tachycardia) (HCC)    a. 10/2012 Noted on Zio Patch.   Sleep apnea    LOST CORD TO CPAP -ONLY 02 @ BEDTIME   Tubular adenoma of colon    Type II diabetes mellitus (HCC)     Assessment: Patient Reported Symptoms: Cognitive Cognitive Status: Struggling with memory recall, Alert and oriented to person, place, and time Other: MRI was done - waiting on result      Neurological Neurological Review of Symptoms:  Other: Neurological Management Strategies: Routine screening Neurological Self-Management Outcome: 3 (uncertain)  HEENT HEENT Symptoms Reported: No symptoms reported HEENT Management Strategies: Routine screening HEENT Self-Management Outcome: 4 (good)    Cardiovascular Cardiovascular Symptoms Reported: No symptoms reported Does patient have uncontrolled Hypertension?:  (Medication changes) Weight: 174 lb (78.9 kg)  Respiratory Respiratory Symptoms Reported: Shortness of breath Other Respiratory Symptoms: a little with walking about put 02 2L if needed Respiratory Management Strategies: Activity, CPAP, Oxygen  therapy Respiratory Self-Management Outcome: 3 (uncertain)  Endocrine Is patient diabetic?: Yes Is patient checking blood sugars at home?: Yes List most recent blood sugar readings, include date and time of day: 117 Endocrine Self-Management Outcome: 4 (good)  Gastrointestinal Gastrointestinal Symptoms Reported: No symptoms reported Gastrointestinal Management Strategies: Activity, Adequate rest, Diet modification, Medication therapy Gastrointestinal Self-Management Outcome: 4 (good)    Genitourinary Genitourinary Symptoms Reported: Incontinence Additional Genitourinary Details: having better control Genitourinary Management Strategies: Incontinence garment/pad, Medication therapy, Adequate rest Genitourinary Self-Management Outcome: 4 (good) Genitourinary Comment: hospital bed  Integumentary Integumentary Symptoms Reported: Bruising Other Integumentary Symptoms: skin discoloration and bumps from time to time Skin Management Strategies: Medication therapy Skin Self-Management Outcome: 3 (uncertain)  Musculoskeletal Musculoskelatal Symptoms Reviewed: No symptoms reported Additional Musculoskeletal Details: unsteady Musculoskeletal Management Strategies:  Medical device Musculoskeletal Self-Management Outcome: 3 (uncertain) Falls in the past year?: Yes Number of falls in past  year: 2 or more Was there an injury with Fall?: No Fall Risk Category Calculator: 2 Patient Fall Risk Level: Moderate Fall Risk    Psychosocial Psychosocial Symptoms Reported: No symptoms reported Additional Psychological Details: medications         There were no vitals filed for this visit.  Medications Reviewed Today     Reviewed by Eilleen Richerd GRADE, RN (Registered Nurse) on 06/16/24 at 1431  Med List Status: <None>   Medication Order Taking? Sig Documenting Provider Last Dose Status Informant  Accu-Chek Softclix Lancets lancets [id number] Yes Use as instructed to check sugar daily for type 2 diabetes. [name], MD  Active Self  albuterol  (PROVENTIL ) (2.5 MG/3ML) 0.083% nebulizer solution [id number] Yes Take 3 mLs (2.5 mg total) by nebulization every 6 (six) hours as needed for wheezing or shortness of breath. [name], MD  Active Self  allopurinol  (ZYLOPRIM ) 300 MG tablet [id number] Yes TAKE 1 TABLET BY MOUTH TWICE A DAY [name], MD  Active Self  ALPRAZolam  (XANAX ) 1 MG tablet [id number] Yes TAKE 1 TABLET BY MOUTH 3 TIMES A DAY AS NEEDED [name], MD  Active Self  aluminum -magnesium  hydroxide-simethicone  (MAALOX) 200-200-20 MG/5ML SUSP [id number] Yes Take 30 mLs by mouth 3 (three) times daily as needed. [name], MD  Active Self  apixaban  (ELIQUIS ) 5 MG TABS tablet [id number] Yes Take 1 tablet (5 mg total) by mouth 2 (two) times daily. [name], MD  Active Self  atorvastatin  (LIPITOR ) 80 MG tablet [id number] Yes TAKE 1 TABLET BY MOUTH AT BEDTIME [name], MD  Active Self  Blood Glucose Calibration (ACCU-CHEK GUIDE CONTROL) [name] [id number]  Use with blood glucose monitor as directed [name], MD  Active Self  Blood Glucose Monitoring Suppl (ACCU-CHEK GUIDE) w/Device KIT [id number]  Use to check blood sugars as directed [name], MD  Active Self  BREZTRI  AEROSPHERE 160-9-4.8 MCG/ACT AERO inhaler [id number] Yes INHALE 2 PUFFS  BY MOUTH INTO THE LUNGS 2TIMES DAILY [name], MD  Active Self  cetirizine  (ZYRTEC ) 10 MG tablet [id number] Yes Take 1 tablet (10 mg total) by mouth at bedtime. [name], MD  Active Self  Cyanocobalamin  (VITAMIN B-12) 1000 MCG SUBL [id number] Yes Place 1 tablet under the tongue daily. [provider]  Active Self  dapagliflozin  propanediol (FARXIGA ) 10 MG TABS tablet [id number] Yes TAKE 1 TABLET BY MOUTH DAILY [name], MD  Active Self  famotidine  (PEPCID ) 20 MG tablet [id number] Yes Take 1 tablet (20 mg total) by mouth 2 (two) times daily. [name], MD  Active Self  glucose blood (ACCU-CHEK GUIDE) test strip [id number]  Use as instructed to check sugar daily for type 2 diabetes. [name], MD  Active Self  iron  polysaccharides (FERREX 150) 150 MG capsule [id number] Yes Take one capsul every Monday, Wednesday, and Friday [name], MD  Active Self  isosorbide  mononitrate (IMDUR ) 120 MG 24 hr tablet [id number] Yes Take 1 tablet (120 mg total) by mouth daily. Von Bellis, MD  Active Self           Med Note BEVERLEE, JOYCE   Sat Jun 10, 2024 12:01 AM) Pt taking 60 mg  lithium  carbonate 150 MG capsule [id number] Yes Take 150 mg by mouth 2 (two) times daily. [provider]  Active Self  metFORMIN  (GLUCOPHAGE -XR) 500  MG 24 hr tablet [id number] Yes TAKE 1 TABLET BY MOUTH DAILY WITH EVENING MEAL [name], MD  Active Self  methocarbamol  (ROBAXIN ) 500 MG tablet [id number] Yes TAKE 1 TABLET BY MOUTH EVERY 8 HOURS AS NEEDED FOR MUSCLE SPASMS [name], MD  Active Self  metoprolol  succinate (TOPROL -XL) 25 MG 24 hr tablet [id number] Yes Take 1 tablet (25 mg total) by mouth daily. Alexander, Natalie, DO  Active   midodrine  (PROAMATINE ) 2.5 MG tablet [id number] Yes Take 1 tablet (2.5 mg total) by mouth 3 (three) times daily with meals. Alexander, Natalie, DO  Active   nitroGLYCERIN  (NITROSTAT ) 0.4 MG SL tablet [id number] Yes Place 1 tablet (0.4 mg  total) under the tongue every 5 (five) minutes as needed for chest pain (Do not take if blood pressure is low, systolic BP<110). [name], MD  Active Self           Med Note HALLIE, [facility]   Tue Mar 21, 2024 12:58 PM) Needs new order  omega-3 acid ethyl esters (LOVAZA ) 1 g capsule [id number] Yes TAKE 4 CAPSULES (4 GRAMS TOTAL) BY WARDEN [name], MD  Active Self  oxyCODONE -acetaminophen  (PERCOCET) 10-325 MG tablet [id number] Yes TAKE 1 TABLET BY MOUTH EVERY 4 HOURS AS NEEDED FOR PAIN [name], MD  Active Self  OXYGEN  [id number] Yes Inhale 2 L into the lungs at bedtime as needed (for shortness of breath). [provider]  Active Self  pantoprazole  (PROTONIX ) 40 MG tablet [id number] Yes TAKE 1 TABLET BY MOUTH 2 TIMES DAILY (30MINUTES BEFORE A MEAL) [name], MD  Active Self  ranolazine  (RANEXA ) 1000 MG SR tablet [id number] Yes Take 1 tablet (1,000 mg total) by mouth 2 (two) times daily. Darci Pore, MD  Active Self  sacubitril -valsartan  (ENTRESTO ) 24-26 MG [id number]  Take 1 tablet by mouth 2 (two) times daily.  Patient not taking: Reported on 06/16/2024   [provider]  Active Self  sennosides-docusate sodium  (SENOKOT-S) 8.6-50 MG tablet [id number] Yes Take 1 tablet by mouth daily as needed for constipation. [name], MD  Active Self  spironolactone  (ALDACTONE ) 25 MG tablet [id number]  Take 1 tablet (25 mg total) by mouth daily.  Patient not taking: Reported on 06/16/2024   [name], MD  Active Self  traZODone  (DESYREL ) 150 MG tablet [id number] Yes Take 1 tablet (150 mg total) by mouth at bedtime as needed for sleep. [name], MD  Active Self  TRINTELLIX  10 MG TABS tablet [id number] Yes Take 10 mg by mouth daily. [provider]  Active Self            Recommendation:   Continue Current Plan of Care Encouraged patient to sign up for MyChart for information, states his grandson can help him. Patient states he  need to change his appointment for Monday, August 4, with Cardiac clinic due to not notifying Medicaid transportation. He states he will call and cancel the appointment.   Richerd Fish, RN, BSN, CCM [facility], [facility] RN Care Manager Direct Dial: [phone]

## 2024-06-17 NOTE — ED Provider Notes (Signed)
 [facility] Emergency Department Progress Note  June 17, 2024 3:34 PM  ED care from Dr. Ector at 3:00 PM. Lawrence [name] is a 68 y.o. male  with past medical history of A-fib (on Eliquis ), CAD s/p stent x4 and CABG in 2016, HFrEF (EF 30-35%), neuropathic pain of chest, HTN, HLD, NSTEMI, COPD on 2L O2, T2DM, GERD, celiac disease, pancreatitis, and OSA (not on CPAP) presenting to the ED for L sided chest pain, fluctuating blood pressure, dizziness upon standing, nausea, and worsening dyspnea. Pending EKG, MRI, and 2hr and 6 hr trops at sign out.    Medical Decision Making and Progress Notes   On exam the patient is non-toxic appearing and in NAD. Vital signs are within normal limits, hemodynamically stable, and afebrile. Physical exam is overall benign and reassuring. The triage vital signs are notable for tachycardia to 111 BPM and hypotension to 160/140, but were otherwise within normal limits. Further, on initial evaluation, the patient's BP when sitting was 136/92 and while standing was 130/85 with a HR of 120 BPM. PT-INR was notable for elevated PT to 13.3. Basic labs were reassuring.Troponin I 0h was reassuring. CXR was reassuring and showed no evidence of acute airspace disease.  External Records Reviewed: Patient's most recent discharge summary [facility]- For review of patient's past medical history.  Social determinants that significantly affected care: None applicable  History obtained from other sources: None  Additional Progress Notes and Critical Care  3:40 PM: The MRI from [facility] showed no evidence of acute intracranial abnormalities. It was notable for mild chronic ischemic changes within the cerebral white matter. 12 x 4 mm parietal scalp lipoma to the R of the midline.  3:46 PM: EKG is notable for A-fib with RVR, an inferior infarct which was previously cited on 03/01/2024, and an abnormal EKG compared to the  EKG on 03/01/2024. The criteria for anterior infarcts are no longer present. The inverted T waves were replaced with nonspecific T wave abnormalities in the inferior leads. The T wave inversion is evident in the lateral leads.  5:06 PM: Patient bradycardia down to 37.  On monitor, it appears that he has a pause that lasted for 1 to 2 seconds.  On evaluation, patient notes significant fatigue, worsening chest pressure when this occurred.  MAO was paged for the patient's chest pain, lightheadedness, and symptomatic bradycardia.   Portions of this record have been created using Scientist, clinical (histocompatibility and immunogenetics). Dictation errors have been sought, but may not have been identified and corrected.    ED Clinical Impression   Final diagnoses:  None   Documentation assistance was provided by Vernell Derry, Scribe on June 17, 2024 at 3:50 PM for Hoy Slade, MD.  June 17, 2024 6:48 PM. Documentation assistance provided by the scribe. I was present during the time the encounter was recorded. The information recorded by the scribe was done at my direction and has been reviewed and validated by me.   Note has been documented by Vernell B. Choffin on 06/17/2024

## 2024-06-19 ENCOUNTER — Encounter

## 2024-06-19 NOTE — Discharge Summary (Signed)
 Physician Discharge Summary [facility] 4 BT1 [facility] [address] [place] [address] Dept: [phone] Loc: [phone]   Identifying Information:  Lawrence [name] 08/08/1956 [id number]  Primary Care Physician: [name], MD  Code Status: DNR and DNI  Admit Date: 06/17/2024  Discharge Date: 06/19/2024   Discharge To: Home with Home Health and/or PT/OT  Discharge Service: HBR - HBR: Hospitalist [facility]   Discharge Attending Physician: Lynwood Sammie Gosling, MD  Discharge Diagnoses: Principal Problem:   Chest pain at rest (POA: Yes) Active Problems:   Type 2 diabetes mellitus    (POA: Yes)   COPD (chronic obstructive pulmonary disease)    (POA: Yes)   Sciatica of left side (POA: Yes)   Paroxysmal A-fib    (POA: Yes)   GERD (gastroesophageal reflux disease) (POA: Yes) Resolved Problems:   * No resolved hospital problems. *   Outpatient Provider Follow Up Issues:  -Close outpatient follow up with PCP and Cariology . Note patient has had numerous ED visits for similar symptoms that are likely related to chronic angina  -Recheck orthostatics in outpatient setting- consider adding back GDMT therapies as tolerated  Hospital Course:  Saxon Barich is a 68 y.o. male that presented to [facility] with Chest pain at rest.   Chest pain at rest I Dizziness I Afib with episodic bradycardia I CAD: Patient was recently admitted at OSH from 7/25-29/25 for syncope and chest pain. Patient underwent work up, and he was found to have orthostatic hypotension and his medications were adjusted (lasix , spironolactone , dapagliflozin , Entresto  were held). Repeat echo on 7/26 revealed LVEF 45-50%, LV with global hypokinesis, grade 1 diastolic dysfunction. CTA during his admission was fairly unrevealing for acute findings. MRI brain on 7/30 without acute abnormalities.   On presentation here he reported worsening of chronic chest pain and lightheadedness. Troponin negative x 3. EKG  without any acute changes c/w ACS. Patient initially with intermittent episodes of bradycardia, and significant episode int he ED when his Afib slowed to HR of 37 and patient reported worsened fatigue, chest pressure. He was evaluated by cardiology and metoprolol  was held given bradycardia concerns and restarted on Ranexa  for antianginal therapy. Repeat ischemic workup was not recommended given recent cath in 10/24 showing stable complex disease. Pacemaker was initially considered given bradycardia although decision was made to continue monitoring in outpatient setting off BB therapy given improvement in vitals. Will need close outpatient follow up to assess if can restart GDMT therapies.   The patient was continued home home medications for type 2 DM, COPD and his psychiatric conditions.   Procedures:   No admission procedures for hospital encounter. ______________________________________________________________________ Discharge Medications:   Your Medication List     STOP taking these medications    ENTRESTO  24-26 mg tablet Generic drug: sacubitril -valsartan    isosorbide  mononitrate 60 MG 24 hr tablet Commonly known as: IMDUR    metoPROLOL  succinate 25 MG 24 hr tablet Commonly known as: Toprol -XL   midodrine  2.5 MG tablet Commonly known as: PROAMATINE    spironolactone  25 MG tablet Commonly known as: ALDACTONE    venlafaxine  75 MG 24 hr capsule Commonly known as: EFFEXOR -XR       CHANGE how you take these medications    furosemide  20 MG tablet Commonly known as: LASIX  Take 1 tablet (20 mg total) by mouth daily as needed for other (edema). What changed: reasons to take this       CONTINUE taking these medications    albuterol  2.5 mg /3 mL (0.083 %)  nebulizer solution Inhale 3 mL (2.5 mg total) every six (6) hours as needed.   allopurinol  300 MG tablet Commonly known as: ZYLOPRIM  Take 1 tablet (300 mg total) by mouth two (2) times a day.   ALPRAZolam  1 MG  tablet Commonly known as: XANAX  Take 1 tablet (1 mg total) by mouth Three (3) times a day as needed for sleep.   apixaban  5 mg Tab Commonly known as: ELIQUIS  Take 1 tablet (5 mg total) by mouth two (2) times a day.   atorvastatin  80 MG tablet Commonly known as: LIPITOR  Take 1 tablet (80 mg total) by mouth every evening.   BREZTRI  AEROSPHERE 160-9-4.8 mcg/actuation inhaler Generic drug: budesonide -glycopyr-formoterol  Inhale 2 puffs once for 1 dose.   cetirizine  10 MG tablet Commonly known as: ZYRTEC  Take 1 tablet (10 mg total) by mouth daily.   dapagliflozin  propanediol 10 mg Tab tablet Commonly known as: FARXIGA  Take 1 tablet (10 mg total) by mouth every morning.   lidocaine  4 % patch Commonly known as: ASPERCREME Place 1 patch on the skin daily.   lithium  150 MG capsule Take 1 capsule (150 mg total) by mouth nightly.   LOVAZA  1 gram capsule Generic drug: omega-3 acid ethyl esters Take 4 capsules (4 g total) by mouth daily.   metFORMIN  500 MG 24 hr tablet Commonly known as: GLUCOPHAGE -XR Take 1 tablet (500 mg total) by mouth daily before breakfast. Start taking on: June 20, 2024   methocarbamol  500 MG tablet Commonly known as: ROBAXIN  Take 1 tablet (500 mg total) by mouth every eight (8) hours as needed.   nitroglycerin  0.4 MG SL tablet Commonly known as: NITROSTAT  Place 1 tablet (0.4 mg total) under the tongue every five (5) minutes as needed for chest pain. Maximum of 3 doses in 15 minutes.   oxyCODONE -acetaminophen  10-325 mg per tablet Commonly known as: PERCOCET Take 1 tablet by mouth every four (4) hours as needed for pain.   OXYGEN -AIR DELIVERY SYSTEMS MISC Inhale 2 L nightly.   pantoprazole  40 MG tablet Commonly known as: Protonix  Take 1 tablet (40 mg total) by mouth daily before breakfast.   ranolazine  1,000 mg SR tablet Commonly known as: RANEXA  Take 1 tablet (1,000 mg total) by mouth two (2) times a day.   traZODone  100 MG tablet Commonly  known as: DESYREL  Take 1 tablet (100 mg total) by mouth nightly.   TRINTELLIX  10 mg tablet Generic drug: vortioxetine  Take 1 tablet (10 mg total) by mouth nightly.       ASK your doctor about these medications    VITAMIN D3 125 mcg (5,000 unit) tablet Generic drug: cholecalciferol  (vitamin D3-125 mcg (5,000 unit)) Take 1 tablet (125 mcg total) by mouth daily.        Allergies: Meperidine, Prednisone , Sulfasalazine, Sulfa (sulfonamide antibiotics), Albuterol , Albuterol  sulfate, Empagliflozin , and Morphine  ______________________________________________________________________ Pending Test Results (if blank, then none): Pending Labs     Order Current Status   Cortisol In process   Cortisol In process   Cortisol In process       Most Recent Labs: All lab results last 24 hours -  Recent Results (from the past 24 hours)  POCT Glucose   Collection Time: 06/18/24  5:53 PM  Result Value Ref Range   Glucose, POC 116 70 - 179 mg/dL  POCT Glucose   Collection Time: 06/18/24  8:28 PM  Result Value Ref Range   Glucose, POC 121 70 - 179 mg/dL  Magnesium  Level   Collection Time: 06/19/24  7:22  AM  Result Value Ref Range   Magnesium  1.9 1.6 - 2.6 mg/dL  Basic Metabolic Panel   Collection Time: 06/19/24  7:22 AM  Result Value Ref Range   Sodium 140 135 - 145 mmol/L   Potassium 4.5 3.4 - 4.8 mmol/L   Chloride 101 98 - 107 mmol/L   CO2 29.0 20.0 - 31.0 mmol/L   Anion Gap 10 5 - 14 mmol/L   BUN 13 9 - 23 mg/dL   Creatinine 8.86 9.26 - 1.18 mg/dL   BUN/Creatinine Ratio 12    eGFR CKD-EPI (2021) Male 70 >=60 mL/min/1.24m2   Glucose 141 70 - 179 mg/dL   Calcium  9.2 8.7 - 10.4 mg/dL  CBC   Collection Time: 06/19/24  7:22 AM  Result Value Ref Range   WBC 5.0 3.6 - 11.2 10*9/L   RBC 5.25 4.26 - 5.60 10*12/L   HGB 15.6 12.9 - 16.5 g/dL   HCT 52.9 60.9 - 51.9 %   MCV 89.5 77.6 - 95.7 fL   MCH 29.7 25.9 - 32.4 pg   MCHC 33.2 32.0 - 36.0 g/dL   RDW 80.5 (H) 87.7 - 84.7 %    MPV 7.9 6.8 - 10.7 fL   Platelet 111 (L) 150 - 450 10*9/L  POCT Glucose   Collection Time: 06/19/24  7:53 AM  Result Value Ref Range   Glucose, POC 136 70 - 179 mg/dL  POCT Glucose   Collection Time: 06/19/24 12:27 PM  Result Value Ref Range   Glucose, POC 162 70 - 179 mg/dL    Relevant Studies/Radiology (if blank, then none): ECG 12 Lead Result Date: 06/19/2024 ATRIAL FIBRILLATION NON-SPECIFIC ST/T WAVE CHANGES ABNORMAL ECG WHEN COMPARED WITH ECG OF 17-Jun-2024 15:19, NO SIGNIFICANT CHANGE WAS FOUND Confirmed by Cleotilde Moccasin ([id number]) on 06/19/2024 8:27:18 AM  ECG 12 Lead Result Date: 06/17/2024 ATRIAL FIBRILLATION SEPTAL INFARCT  , AGE UNDETERMINED INFERIOR INFARCT (CITED ON OR BEFORE 17-Jun-2024) ABNORMAL ECG WHEN COMPARED WITH ECG OF 17-Jun-2024 13:17, VENT. RATE HAS DECREASED by  52 bpm NONSPECIFIC T WAVE ABNORMALITY HAS REPLACED INVERTED T WAVES IN INFERIOR LEADS Confirmed by Claudene Legions [phone]) on 06/17/2024 9:44:36 PM  ECG 12 Lead Result Date: 06/17/2024 ATRIAL FIBRILLATION WITH RAPID VENTRICULAR RESPONSE INFERIOR INFARCT (CITED ON OR BEFORE 01-Mar-2024) ABNORMAL ECG WHEN COMPARED WITH ECG OF 01-Mar-2024 19:22, CRITERIA FOR ANTERIOR INFARCT  ARE NO LONGER PRESENT INVERTED T WAVES HAVE REPLACED NONSPECIFIC T WAVE ABNORMALITY IN INFERIOR LEADS T WAVE INVERSION NOW EVIDENT IN LATERAL LEADS Confirmed by Claudene Legions ([id number]) on 06/17/2024 3:40:30 PM  XR Chest 2 views Result Date: 06/17/2024 EXAM: XR CHEST 2 VIEWS ACCESSION: [id number] UN REPORT DATE: 06/17/2024 1:59 PM CLINICAL INDICATION: CHEST PAIN ; Chest Pain  TECHNIQUE: PA and Lateral Chest Radiographs COMPARISON: None FINDINGS: S/p sternotomy Lungs hypoinflated. No focal consolidation. No pleural effusion or pneumothorax. Aortic knob calcifications. Normal heart size and mediastinal contours.   No acute airspace disease.   ______________________________________________________________________ Discharge Instructions:      Other  Instructions     Discharge instructions     You were admitted to the hospital with concerns for chest pain and dizziness. You underwent a cardiac work up and were not found to have a specific etiology for your worsening chest pain. Our cardiology team believes the pain is likely related to your underlying coronary artery disease although there were no indications to perform additional catheterizations. We have restarted a medication call Ranexa  to help with anginal symptoms. This medication can cause constipation and I  will also send a prescription of miralax  to take as needed for constipation. Please follow up closely with your primary care provider within a week to ensure you are progressing and you will also need to call and make a follow up appointment with your cardiologist. PLease pay close attention to your medications on discharge as many have changed.       Follow Up instructions and Outpatient Referrals    Ambulatory Referral to Home Health     Reason for referral: home health   Physician to follow patient's care: PCP   Disciplines requested:  Physical Therapy Occupational Therapy     Physical Therapy requested:  Home safety evaluation Evaluate and treat     Occupational Therapy Requested:  Home safety evaluation Evaluate and treat     Requested SOC Date:  Comment - within [facility]   Discharge instructions       Appointments which have been scheduled for you    Jul 05, 2024 UGI ENDOSCOPY; WITH BIOPSY, SINGLE OR MULTIPLE with Vannie JONETTA Landau, MD GI PERIOP [facility] REGION) [address] [place] [address] [phone]   Nov 03, 2024 10:40 AM RETURN CARDIOLOGY with Margery Ruth, MD [facility] CARDIOLOGY AT [facility] REGION) [address] [place] [address] [phone]        ______________________________________________________________________ Discharge Day Services: BP 124/86   Pulse 67   Temp 36.4 C (97.5 F) (Oral)    Resp 18   Ht 170.2 cm (5' 7)   Wt 78.6 kg (173 lb 4.8 oz)   SpO2 95%   BMI 27.14 kg/m  Pt seen on the day of discharge and determined appropriate for discharge.  Condition at Discharge: fair  Length of Discharge: I spent greater than 30 mins in the discharge of this patient.

## 2024-06-19 NOTE — Care Plan (Signed)
  Care Management Final Transition Planning Assessment    06/19/2024  Patient's Post Acute Contact Information: [phone]     Has a PCP appointment been made?: No   Has a specialist appointment been made?: Yes Future Appointments  Date Time Provider Department Center  11/03/2024 10:40 AM Jama Old, MD [facility]      Post Acute Facility needed at discharge?: No        Home Care/ Home Medical Equipment needed at discharge?: Yes Home Care/ Home Medical Equipment: Home Health (specify)  [facility]  - Admitted Since 06/17/2024     Service Provider Services Address Phone Fax Patient Preferred   [facility] [address] [place] [address] [phone] --       Outpatient/Community Referrals needed for discharge?: No     Agency detail (Name/Phone #): PCP Nancyann Perry Transportation Anticipated: family or friend will provide    Currently receiving outpatient dialysis?: No       Discharge Disposition: Home w/ Home Health        Quality data for continuing care services shared with patient and/or representative?: Yes Patient and/or family were provided with choice of facilities / services that are available and appropriate to meet post hospital care needs?: Yes  List choices in order highest to lowest preferred, if applicable. : [facility]     Final Assessment Complete: Yes                     Readmission Risk Score:  Predictive Model Details  No score data available for [facility] Risk of Unplanned Readmission

## 2024-06-19 NOTE — Care Plan (Signed)
 Right to Choose Post-Acute Care Service Providers [facility] Care Management  Patient Patient Name: Lawrence [name]  Date of Birth: [birth year] [facility] Medical Record #: [id number]   Right to Choice [facility] is pleased to provide you with the attached list of post-acute care service providers who are licensed and available to provide the services you need at the zip code of your discharge destination. This list of providers was extracted from the Centers for Medicare and Medicaid Services on-line list of certified agencies or the in-network list supplied by your commercial insurer. You have the freedom to choose the provider which will provide post-discharge services to you.  When possible, [facility] will respect your goals of care, treatment preferences, and other preferences you express. [facility] does not endorse or guarantee the quality of services provided by the agencies listed. [facility] will make reasonable attempts to arrange services with the providers you identify as top choices, however we cannot guarantee service availability from any agency. If services from your choice of provider(s) are not available within timeframes required for your care, we will contact other providers on the attached list to make arrangements.    Financial Disclosure [facility] is a department of [facility]     For Questions or Concerns If you have any questions about your right to choose or the agencies on the list provided, please contact the person listed below, or the Care Management Department at [phone].

## 2024-06-19 NOTE — Consults (Signed)
 Care Management Initial Transition Planning Assessment            General Care Manager / Social Worker assessed the patient by : In person interview with patient, Medical record review, Discussion with Clinical Care team Orientation Level: Oriented X4 Functional level prior to admission: Independent Reason for referral: Discharge Planning  Contact/Decision Maker Extended Emergency Contact Information Primary Emergency Contact: [place] Mobile Phone: [phone] Relation: Grandchild Secondary Emergency Contact: Prince,[name] Mobile Phone: [phone] Relation: Daughter in Arts administrator Next of Kin / Guardian / POA / Advance Directives    HCDM (patient stated preference): Lawrence [name], Lawrence [name] [phone]  Advance Directive (Medical Treatment) Does patient have an advance directive covering medical treatment?: Patient does not have advance directive covering medical treatment. Reason patient does not have an advance directive covering medical treatment:: Patient does not wish to complete one at this time.  Health Care Decision Maker [HCDM] (Medical & Mental Health Treatment) Healthcare Decision Maker: HCDM documented in the HCDM/Contact Info section. Information offered on HCDM, Medical & Mental Health advance directives:: Patient given information.  Advance Directive (Mental Health Treatment) Does patient have an advance directive covering mental health treatment?: Patient does not have advance directive covering mental health treatment. Reason patient does not have an advance directive covering mental health treatment:: Patient does not wish to complete one at this time.  Readmission Information   Patient Information Lives with: Family members  Type of Residence: Private residence     Location/Detail: [address] [address]   Mobile Phone:  [phone]  Support Systems/Concerns: Family Members  Responsibilities/Dependents at home?:  No  Home Care services in place prior to admission?: Yes Type of Home Care services in place prior to admission: Attendant Current Home Care provider (Name/Phone #): [facility]  Outpatient/Community Resources in place prior to admission: Clinic Agency detail (Name/Phone #): PCP Nancyann Perry  Equipment Currently Used at [employer]: commode chair, walker, rolling, grab bar, tub/shower, other (see comments), shower chair Current HME Agency (Name/Phone #): ramped entrance  Currently receiving outpatient dialysis?: No    Financial Information    Need for financial assistance?: No    Social Determinants of Health Social Determinants of Health were addressed in provider documentation.  Please refer to patient history. Social Drivers of Health   Food Insecurity: Food Insecurity Present (06/16/2024)   Received from [facility]   Hunger Vital Sign   . Within the past 12 months, you worried that your food would run out before you got the money to buy more.: Sometimes true   . Within the past 12 months, the food you bought just didn't last and you didn't have money to get more.: Never true  Tobacco Use: Medium Risk (06/18/2024)   Patient History   . Smoking Tobacco Use: Former   . Smokeless Tobacco Use: Never   . Passive Exposure: Not on file  Transportation Needs: No Transportation Needs (06/16/2024)   Received from [facility] - Transportation   . In the past 12 months, has lack of transportation kept you from medical appointments or from getting medications?: No   . In the past 12 months, has lack of transportation kept you from meetings, work, or from getting things needed for daily living?: No  Alcohol Use: Not At Risk (06/07/2024)   Received from [facility]   AUDIT-C   . Q1: How often do you have a drink containing alcohol?: Never   . Q2: How  many drinks containing alcohol do you have on a typical day when you are drinking?: Patient does not drink   . Q3: How  often do you have six or more drinks on one occasion?: Never  Housing: Low Risk  (06/19/2024)   Housing   . Within the past 12 months, have you ever stayed: outside, in a car, in a tent, in an overnight shelter, or temporarily in someone else's home (i.e. couch-surfing)?: No   . Are you worried about losing your housing?: No  Physical Activity: Insufficiently Active (03/14/2024)   Received from [facility]   Exercise Vital Sign   . On average, how many days per week do you engage in moderate to strenuous exercise (like a brisk walk)?: 3 days   . On average, how many minutes do you engage in exercise at this level?: 20 min  Utilities: Not At Risk (06/16/2024)   Received from [facility] Utilities   . In the past 12 months has the electric, gas, oil, or water company threatened to shut off services in your home?: No  Stress: No Stress Concern Present (03/14/2024)   Received from [facility] of Occupational Health - Occupational Stress Questionnaire   . Feeling of Stress : Only a little  Recent Concern: Stress - Stress Concern Present (01/13/2024)   Received from [facility] of Occupational Health - Occupational Stress Questionnaire   . Feeling of Stress : To some extent  Interpersonal Safety: Not At Risk (06/17/2024)   Interpersonal Safety   . Unsafe Where You Currently Live: No   . Physically Hurt by Anyone: No   . Abused by Anyone: No  Substance Use: Low Risk  (07/23/2023)   Substance Use   . In the past year, how often have you used prescription drugs for non-medical reasons?: Never   . In the past year, how often have you used illegal drugs?: Never   . In the past year, have you used any substance for non-medical reasons?: No  Intimate Partner Violence: Not At Risk (06/16/2024)   Received from [facility]   Humiliation, Afraid, Rape, and Kick questionnaire   . Within the last year, have you been afraid of your partner or ex-partner?: No   .  Within the last year, have you been humiliated or emotionally abused in other ways by your partner or ex-partner?: No   . Within the last year, have you been kicked, hit, slapped, or otherwise physically hurt by your partner or ex-partner?: No   . Within the last year, have you been raped or forced to have any kind of sexual activity by your partner or ex-partner?: No  Social Connections: Socially Isolated (06/10/2024)   Received from [facility]   Social Connection and Isolation Panel   . In a typical week, how many times do you talk on the phone with family, friends, or neighbors?: More than three times a week   . How often do you get together with friends or relatives?: Once a week   . How often do you attend church or religious services?: Never   . Do you belong to any clubs or organizations such as church groups, unions, fraternal or athletic groups, or school groups?: No   . How often do you attend meetings of the clubs or organizations you belong to?: Never   . Are you married, widowed, divorced, separated, never married, or living with a partner?: Widowed  Financial Resource Strain: Low Risk  (06/19/2024)   Overall Financial Resource Strain (CARDIA)   . Difficulty of Paying Living Expenses: Not very hard  Health Literacy: Adequate Health Literacy (03/14/2024)   Received from [facility]   B1300 Health Literacy   . Frequency of need for help with medical instructions: Never  Internet Connectivity: Not on file    Complex Discharge Information  Is patient identified as a difficult/complex discharge?: No   Discharge Needs Assessment Concerns to be Addressed: discharge planning, home safety  Clinical Risk Factors: > 65, New Diagnosis, Functional Limitations, Readmission < 30 Days  Barriers to taking medications: No  Prior overnight hospital stay or ED visit in last 90 days: Yes        Anticipated Changes Related to Illness: inability to care for self  Equipment Needed After  Discharge: none  Discharge Facility/Level of Care Needs: other (see comments) (home with home health)  Readmission Risk of Unplanned Readmission Score:  % Predictive Model Details  No score data available for [facility] Risk of Unplanned Readmission   Readmitted Within the Last 30 Days? (No if blank)  Patient at risk for readmission?: Yes  Discharge Plan Screen findings are: Discharge planning needs identified or anticipated (Comment). (choice offered, [facility])  Expected Discharge Date: 06/19/2024  Expected Transfer from Critical Care:    Quality data for continuing care services shared with patient and/or representative?: Yes Patient and/or family were provided with choice of facilities / services that are available and appropriate to meet post hospital care needs?: Yes  List choices in order highest to lowest preferred, if applicable. : [facility]  Initial Assessment complete?: Yes

## 2024-06-19 NOTE — Care Plan (Signed)
 [facility] at [phone].  Spoke w/ Camie. Referral accepted for home health. Home health referral and referral documentation via Epic Lawrence [name] June 19, 2024 1:53 PM

## 2024-06-19 NOTE — Care Plan (Signed)
 [facility] Care Referral Clinical Care Management phone [phone] fax [phone]  Demographics Patient Name: Lawrence [name] Date of Birth: [birth year] Gender: Male [facility] Medical Record #: [id number] Billing Address: [address] [name] [address] Destination Address: Mailing Address:  [address] [name] [address]  Anticipated Discharge Date: 06-19-24 Medicare homebound criteria met? Yes Known concerns about discharge environment? No Right to Choice document & provider list(s) provided and discussed with patient/family? Yes Teachable caregiver identified: Caregiver: Extended Emergency Contact Information Primary Emergency Contact: [place],[name] Mobile Phone: [phone] Relation: Grandchild Secondary Emergency Contact: Prince,[name] Mobile Phone: [phone] Relation: Daughter in Law Additional Information: N/A Language Barrier (if yes, identify language): No  This Document is a Referral Only Additional information, including specific home care orders and instructions, will be provided in the electronic discharge summary.   Clinical Condition []  See attached medical records Admitting Diagnosis:  chest pain, symptomatic bradycardia Past Medical History:   has a past medical history of CAD (coronary artery disease), Chronic back pain, COPD (chronic obstructive pulmonary disease), DM (diabetes mellitus), Enlarged prostate, Fatty liver, GERD (gastroesophageal reflux disease), HLD (hyperlipidemia), Hypertension, Myocardial infarction, OSA (obstructive sleep apnea), and Pancreatitis (HHS-HCC). Past Surgical History:   has a past surgical history that includes Arterial bypass surgry; Coronary stent placement; Tonsillectomy and adenoidectomy; Cholecystectomy; Cardiac surgery; pr cath place/coron angio, img super/interp,w left heart ventriculography (N/A, 08/21/2015); Cardiac catheterization; pr cath place/coron angio,  img super/interp,w left heart ventriculography (N/A, 06/09/2018); and pr cath place/coron angio, img super/interp,w left heart ventriculography (N/A, 08/17/2023).   Name of MD signing discharge orders and contact info: [name] Primary Care Physician: [name], MD  Services Requested [x]  New Referral []  Resumption of Care  [x]  This is patient at risk for readmission  Physical Therapy: Please evaluate and treat. NOTE: Weightbearing: full weightbearing Occupational Therapy: Please evaluate and treat  Additional Order Information:   N/A  Requested Start of Care [facility]):  Within 48H  CCM Case Manager to electronically sign referral when complete.  Katherine O Davis

## 2024-06-21 ENCOUNTER — Other Ambulatory Visit: Payer: Self-pay | Admitting: Family Medicine

## 2024-06-21 ENCOUNTER — Other Ambulatory Visit: Payer: Self-pay

## 2024-06-21 DIAGNOSIS — F419 Anxiety disorder, unspecified: Secondary | ICD-10-CM

## 2024-06-21 DIAGNOSIS — I252 Old myocardial infarction: Secondary | ICD-10-CM

## 2024-06-21 DIAGNOSIS — D649 Anemia, unspecified: Secondary | ICD-10-CM

## 2024-06-21 DIAGNOSIS — E119 Type 2 diabetes mellitus without complications: Secondary | ICD-10-CM

## 2024-06-21 DIAGNOSIS — I5042 Chronic combined systolic (congestive) and diastolic (congestive) heart failure: Secondary | ICD-10-CM | POA: Diagnosis not present

## 2024-06-21 DIAGNOSIS — I2510 Atherosclerotic heart disease of native coronary artery without angina pectoris: Secondary | ICD-10-CM | POA: Diagnosis not present

## 2024-06-21 DIAGNOSIS — J4489 Other specified chronic obstructive pulmonary disease: Secondary | ICD-10-CM

## 2024-06-21 DIAGNOSIS — F319 Bipolar disorder, unspecified: Secondary | ICD-10-CM

## 2024-06-21 DIAGNOSIS — I471 Supraventricular tachycardia, unspecified: Secondary | ICD-10-CM

## 2024-06-21 DIAGNOSIS — I110 Hypertensive heart disease with heart failure: Secondary | ICD-10-CM | POA: Diagnosis not present

## 2024-06-21 DIAGNOSIS — I482 Chronic atrial fibrillation, unspecified: Secondary | ICD-10-CM | POA: Diagnosis not present

## 2024-06-21 DIAGNOSIS — J439 Emphysema, unspecified: Secondary | ICD-10-CM

## 2024-06-21 NOTE — Patient Instructions (Signed)
 Visit Information  Thank you for taking time to visit with me today. Please don't hesitate to contact me if I can be of assistance to you before our next scheduled telephone appointment.  Our next appointment is by telephone on 06/29/24 at 1300  Following is a copy of your care plan:   Goals Addressed             This Visit's Progress    [facility]I Transitions of Care (TOC) Care Plan       Problems: (Reviewd Recent Hospitalization for treatment of COPD, Pulmonary Disease, and Pneumonia Was in hospital observation for a couple of days for  Syncope 06/21/24 Obs status - 8/2 -06/19/24  Returned to hospital for low BP 8/2 25 had medications adjusted again Goal:  Over the next 30 days, the patient will not experience hospital readmission  Interventions:   COPD Interventions: Advised patient to self assesses COPD action plan zone and make appointment with provider if in the yellow zone for 48 hours without improvement Discussed the importance of adequate rest and management of fatigue with COPD Provided education about and advised patient to utilize infection prevention strategies to reduce risk of respiratory infection Use of home oxygen  (06/21/24 not using at night since has CPAP) and now uses prn and has CPAP Medications adjusted while in observation for syncope  Patient Self Care Activities:  Attend all scheduled provider appointments Call pharmacy for medication refills 3-7 days in advance of running out of medications Call provider office for new concerns or questions  Notify RN Care Manager of TOC call rescheduling needs Participate in Transition of Care Program/Attend TOC scheduled calls  Plan:  Follow up and participate in 30 day weekly calls for the next 4 weeks. Contact your provider for any worsening symptoms with chills, fever, difficulty breathing Patient and grandson, [name], to manage medications changes and to follow up with PCP has appointment for 06/23/24 with PCP Dr. Gasper to  review.  06/21/24 Had observation at [facility] with more medications adjusted.  Follow up with PCP on 06/23/24, contacted DSS at [phone] and left message for transportation to appointment 06/23/24 at 10:00 AM.  Called back and have the patient scheduled and will call him prior to appointment for pick up time. Follow up with TOC next week, regarding medications that were paused.         Patient verbalizes understanding of instructions and care plan provided today and agrees to view in MyChart. Active MyChart status and patient understanding of how to access instructions and care plan via MyChart confirmed with patient.   Gave phone number for grandson to call for assistance to [phone] to get back in and referenced to AVS sheet as well.  The patient has been provided with contact information for the care management team and has been advised to call with any health related questions or concerns.  Follow up with provider re: all of the medications changes  Please call the care guide team at [phone] if you need to cancel or reschedule your appointment.   Please call the [place]  National Suicide Prevention Lifeline: 618-579-5344 or TTY: 804-681-3954 TTY (661)486-6782) to talk to a trained counselor call 1-800-273-TALK (toll free, 24 hour hotline) if you are experiencing a Mental Health or Behavioral Health Crisis or need someone to talk to.  Richerd Fish, RN, BSN, CCM [facility], [facility] RN Care Manager Direct Dial: [phone]

## 2024-06-21 NOTE — Transitions of Care (Post Inpatient/ED Visit) (Signed)
 Transition of Care week 4  Visit Note  06/21/2024  Name: Lawrence [name] MRN: [id number]          DOB: March 03, 1956  Situation: Patient enrolled in [facility] 30-day program. Visit completed with patient by telephone.   Background:   Initial Transition Care Management Follow-up Telephone Call    Past Medical History:  Diagnosis Date   A-fib (HCC)    Anemia    Anginal pain (HCC)    Anxiety    Arthritis    Asthma    CAD (coronary artery disease)    a. 2002 CABGx2 (LIMA->LAD, VG->VG->OM1);  b. 09/2012 DES->OM;  c. 03/2015 PTCA of LAD [facility]) in setting of atretic LIMA; d. 05/2015 Cath [facility]): nonobs dzs; e. 06/2015 Cath ([facility]): LM nl, LAD 45p/d ISR, 50d, D1/2 small, LCX 50p/d ISR, OM1 70ost, 30 ISR, VG->OM1 50ost, 5m, LIMA->LAD 99p/d - atretic, RCA dom, nl; f.cath 10/16: 40-50%(FFR 0.90) pLAD, 75% (FFR 0.77) mLAD s/p PCI/DES, oRCA 40% (FFR0.95)   Cancer (HCC)    SKIN CANCER ON BACK   Celiac disease    Chronic diastolic CHF (congestive heart failure) (HCC)    a. 06/2009 Echo: EF 60-65%, Gr 1 DD, triv AI, mildly dil LA, nl RV.   COPD (chronic obstructive pulmonary disease) (HCC)    a. Chronic bronchitis and emphysema.   DDD (degenerative disc disease), lumbar    Diverticulosis    Dysrhythmia    Essential hypertension    GERD (gastroesophageal reflux disease)    History of hiatal hernia    History of kidney stones    H/O   History of tobacco abuse    a. Quit 2014.   Myocardial infarction [facility]) 2002   4 STENTS   Pancreatitis    PSVT (paroxysmal supraventricular tachycardia) (HCC)    a. 10/2012 Noted on [facility].   Sleep apnea    LOST CORD TO CPAP -ONLY 02 @ BEDTIME   Tubular adenoma of colon    Type II diabetes mellitus (HCC)     Assessment: Patient Reported Symptoms: Cognitive Cognitive Status: Able to follow simple commands, Alert and oriented to person, place, and time, Struggling with memory recall Cognitive/Intellectual Conditions Management [RPT]: None reported or documented  in medical history or problem list   Health Maintenance Behaviors: Annual physical exam, Exercise, Stress management Healing Pattern: Unsure Health Facilitated by: Pain control, Stress management  Neurological Neurological Review of Symptoms: Other:, No symptoms reported Neurological Management Strategies: Routine screening Neurological Self-Management Outcome: 3 (uncertain) Neurological Comment: memory loss r/o alzhiemers he states  HEENT HEENT Symptoms Reported: No symptoms reported HEENT Management Strategies: Routine screening HEENT Self-Management Outcome: 4 (good)    Cardiovascular Cardiovascular Symptoms Reported: No symptoms reported (no problem today HR went down in the 30's on Saturday) Does patient have uncontrolled Hypertension?: Yes Is patient checking Blood Pressure at home?: Yes Patient's Recent BP reading at home: 136/77 Cardiovascular Management Strategies: Fluid modification, Medication therapy Weight: 179 lb (81.2 kg) Cardiovascular Self-Management Outcome: 3 (uncertain)  Respiratory Respiratory Symptoms Reported: No symptoms reported Other Respiratory Symptoms: not short of breath today; has oxygen  if he needs it and inhalers, nebulizer but has take his inhaler 2 puffs routinely Additional Respiratory Details: CPAP Respiratory Management Strategies: Activity, CPAP, Fluid modification, Medication therapy, Oxygen  therapy Respiratory Self-Management Outcome: 3 (uncertain)  Endocrine Endocrine Symptoms Reported: No symptoms reported Is patient diabetic?: Yes Is patient checking blood sugars at home?: Yes List most recent blood sugar readings, include date and time of day: 131 Endocrine  Self-Management Outcome: 4 (good)  Gastrointestinal Gastrointestinal Symptoms Reported: No symptoms reported Additional Gastrointestinal Details: no problems Gastrointestinal Management Strategies: Activity, Diet modification, Medication therapy Gastrointestinal Self-Management Outcome:  4 (good)    Genitourinary Genitourinary Symptoms Reported: Incontinence Genitourinary Management Strategies: Incontinence garment/pad Genitourinary Self-Management Outcome: 4 (good)  Integumentary Integumentary Symptoms Reported: Bruising Other Integumentary Symptoms: from labs at hospital ; skin bumps Skin Management Strategies: Medication therapy Skin Self-Management Outcome: 3 (uncertain) Skin Comment: encouraged diabetic foot care observatin  Musculoskeletal Musculoskelatal Symptoms Reviewed: No symptoms reported, Unsteady gait, Weakness Additional Musculoskeletal Details: generalzed weakness Musculoskeletal Management Strategies: Activity, Adequate rest, Medical device, Medication therapy Musculoskeletal Self-Management Outcome: 4 (good) Falls in the past year?: Yes Number of falls in past year: 2 or more Was there an injury with Fall?: No Fall Risk Category Calculator: 2 Patient Fall Risk Level: Moderate Fall Risk Patient at Risk for Falls Due to: History of fall(s)  Psychosocial Psychosocial Symptoms Reported: Anxiety - if selected complete GAD Additional Psychological Details: on medications - keeps me pretty calm Behavioral Management Strategies: Medication therapy Major Change/Loss/Stressor/Fears (CP): Denies Techniques to Cope with Loss/Stress/Change: Medication Quality of Family Relationships: supportive (grandson) Do you feel physically threatened by others?: No   Vitals:   06/21/24 1405  BP: 136/77    Medications Reviewed Today   Medications were not reviewed in this encounter     Recommendation:   PCP Follow-up Continue Current Plan of Care  Follow Up Plan:   Referral to Called DSS today regarding transportation to appointment on Friday 06/23/24 at 10 AM to [phone] and received call back that patient is scheduled and will call him to confirm appointment on 06/22/24.  Richerd Fish, RN, BSN, CCM [facility], [facility]  Health RN Care Manager Direct Dial: [phone]

## 2024-06-22 ENCOUNTER — Telehealth

## 2024-06-22 NOTE — Telephone Encounter (Signed)
 Copied from CRM [id number]. Topic: Clinical - Prescription Issue >> Jun 22, 2024  1:09 PM Mia F wrote: Reason for CRM: PT is calling to follow up on the refill request from pharmacy. Please advise   oxyCODONE -acetaminophen  (PERCOCET) 10-325 MG tablet

## 2024-06-23 ENCOUNTER — Encounter: Payer: Self-pay | Admitting: Family Medicine

## 2024-06-23 ENCOUNTER — Ambulatory Visit (INDEPENDENT_AMBULATORY_CARE_PROVIDER_SITE_OTHER): Admitting: Family Medicine

## 2024-06-23 VITALS — BP 138/98 | HR 98 | Temp 97.7°F | Ht 67.0 in | Wt 177.9 lb

## 2024-06-23 DIAGNOSIS — I951 Orthostatic hypotension: Secondary | ICD-10-CM | POA: Diagnosis not present

## 2024-06-23 DIAGNOSIS — R1114 Bilious vomiting: Secondary | ICD-10-CM

## 2024-06-23 DIAGNOSIS — I482 Chronic atrial fibrillation, unspecified: Secondary | ICD-10-CM | POA: Diagnosis not present

## 2024-06-23 DIAGNOSIS — R6883 Chills (without fever): Secondary | ICD-10-CM

## 2024-06-23 DIAGNOSIS — I5042 Chronic combined systolic (congestive) and diastolic (congestive) heart failure: Secondary | ICD-10-CM

## 2024-06-23 MED ORDER — ONDANSETRON HCL 4 MG PO TABS: 4.0000 mg | ORAL_TABLET | Freq: Three times a day (TID) | ORAL | 0 refills | Status: DC | PRN

## 2024-06-23 MED ORDER — AZITHROMYCIN 250 MG PO TABS: ORAL_TABLET | ORAL | 0 refills | Status: AC

## 2024-06-23 NOTE — Progress Notes (Signed)
 Established patient visit   Patient: Lawrence [name]   DOB: [birth year]   68 y.o. Male  MRN: [id number] Visit Date: 05/26/2024  Today's healthcare provider: Nancyann Perry, MD   Chief Complaint  Patient presents with   Follow-up    Patient here to follow-up from ER Visit. He was seen at the ED 05/18/24.   Subjective    Discussed the use of AI scribe software for clinical note transcription with the patient, who gave verbal consent to proceed.  History of Present Illness   Lawrence [name] is a 68 year old male who presents with symptoms following a recent pneumonia diagnosis.  He recently visited the emergency room on May 18, 2024, where he was diagnosed with pneumonia and prescribed two antibiotics, which he is still taking. He reports wheezing, shortness of breath, and mild chest pain, which seem to be getting better but are not completely gone. No fevers, chills, or sweats are present.  He requires an albuterol  nebulizer as needed but currently lacks medication for it and mentions the need for albuterol  sulfate. He also uses metronidazole  cream intermittently as needed, which he finds helpful.  He is still awaiting a CPAP machine, which he has sent back for adjustments, and has not yet received any updates on its status.       Medications: Outpatient Medications Prior to Visit  Medication Sig   Accu-Chek Softclix Lancets lancets Use as instructed to check sugar daily for type 2 diabetes.   allopurinol  (ZYLOPRIM ) 300 MG tablet TAKE 1 TABLET BY MOUTH TWICE A DAY   ALPRAZolam  (XANAX ) 1 MG tablet TAKE 1 TABLET BY MOUTH 3 TIMES A DAY AS NEEDED   apixaban  (ELIQUIS ) 5 MG TABS tablet Take 1 tablet (5 mg total) by mouth 2 (two) times daily.   atorvastatin  (LIPITOR ) 80 MG tablet TAKE 1 TABLET BY MOUTH AT BEDTIME   Blood Glucose Calibration (ACCU-CHEK GUIDE CONTROL) LIQD Use with blood glucose monitor as directed   Blood Glucose Monitoring Suppl (ACCU-CHEK GUIDE) w/Device KIT Use to  check blood sugars as directed   cetirizine  (ZYRTEC ) 10 MG tablet Take 1 tablet (10 mg total) by mouth at bedtime.   Cyanocobalamin  (VITAMIN B-12) 1000 MCG SUBL Place 1 tablet under the tongue daily.   dapagliflozin  propanediol (FARXIGA ) 10 MG TABS tablet TAKE 1 TABLET BY MOUTH DAILY   famotidine  (PEPCID ) 20 MG tablet Take 1 tablet (20 mg total) by mouth 2 (two) times daily.   glucose blood (ACCU-CHEK GUIDE) test strip Use as instructed to check sugar daily for type 2 diabetes.   lithium  carbonate 150 MG capsule Take 150 mg by mouth 2 (two) times daily.   metFORMIN  (GLUCOPHAGE -XR) 500 MG 24 hr tablet TAKE 1 TABLET BY MOUTH DAILY WITH EVENING MEAL   methocarbamol  (ROBAXIN ) 500 MG tablet TAKE 1 TABLET BY MOUTH EVERY 8 HOURS AS NEEDED FOR MUSCLE SPASMS   nitroGLYCERIN  (NITROSTAT ) 0.4 MG SL tablet Place 1 tablet (0.4 mg total) under the tongue every 5 (five) minutes as needed for chest pain (Do not take if blood pressure is low, systolic BP<110).   omega-3 acid ethyl esters (LOVAZA ) 1 g capsule TAKE 4 CAPSULES (4 GRAMS TOTAL) BY MOUTHDAILY   OXYGEN  Inhale 2 L into the lungs at bedtime as needed (for shortness of breath).   pantoprazole  (PROTONIX ) 40 MG tablet TAKE 1 TABLET BY MOUTH 2 TIMES DAILY (30MINUTES BEFORE A MEAL)   [Paused] sacubitril -valsartan  (ENTRESTO ) 24-26 MG Take 1 tablet by mouth  2 (two) times daily. (Patient not taking: Reported on 06/16/2024)   [Paused] spironolactone  (ALDACTONE ) 25 MG tablet Take 1 tablet (25 mg total) by mouth daily. (Patient not taking: Reported on 06/16/2024)   traZODone  (DESYREL ) 150 MG tablet Take 1 tablet (150 mg total) by mouth at bedtime as needed for sleep.   TRINTELLIX  10 MG TABS tablet Take 10 mg by mouth daily.   amLODipine  (NORVASC ) 10 MG tablet Take 10 mg by mouth daily.   amoxicillin -clavulanate (AUGMENTIN ) 875-125 MG tablet Take 1 tablet by mouth 2 (two) times daily for 10 days.   Budeson-Glycopyrrol-Formoterol  (BREZTRI  AEROSPHERE) 160-9-4.8 MCG/ACT AERO  Inhale 2 puffs into the lungs 2 (two) times daily.   carvedilol  (COREG ) 3.125 MG tablet TAKE 1 TABLET BY MOUTH TWICE A DAY    doxycycline  (VIBRA -TABS) 100 MG tablet Take 1 tablet (100 mg total) by mouth 2 (two) times daily for 10 days.    gabapentin  (NEURONTIN ) 300 MG capsule Take 1 capsule (300 mg total) by mouth 2 (two) times daily.   iron  polysaccharides (FERREX 150) 150 MG capsule TAKE 1 CAPSULE BY MOUTH DAILY   isosorbide  mononitrate (IMDUR ) 60 MG 24 hr tablet Take 60 mg by mouth daily.   oxyCODONE -acetaminophen  (PERCOCET) 10-325 MG tablet TAKE 1 TABLET BY MOUTH EVERY 4 HOURS AS NEEDED FOR PAIN   isosorbide  mononitrate (IMDUR ) 120 MG 24 hr tablet Take 1 tablet (120 mg total) by mouth daily.   sucralfate  (CARAFATE ) 1 g tablet Take 1 tablet (1 g total) by mouth every 8 (eight) hours as needed for up to 10 days.   venlafaxine  (EFFEXOR ) 25 MG tablet Take 25 mg by mouth daily. (Patient not taking: No sig reported)   No facility-administered medications prior to visit.   Review of Systems  Constitutional:  Negative for appetite change, chills and fever.  Respiratory:  Positive for cough, shortness of breath and wheezing. Negative for chest tightness.   Cardiovascular:  Negative for chest pain and palpitations.  Gastrointestinal:  Negative for abdominal pain, nausea and vomiting.       Objective    BP (!) 76/50 (BP Location: Left Arm, Patient Position: Sitting, Cuff Size: Normal)   Pulse (!) 56   Temp 98 F (36.7 C) (Oral)   Resp 16   Wt 192 lb (87.1 kg)   SpO2 98%   BMI 30.07 kg/m   Physical Exam   General: Appearance:    Well developed, well nourished male in no acute distress  Eyes:    PERRL, conjunctiva/corneas clear, EOM's intact       Lungs:     Clear to auscultation bilaterally, respirations unlabored  Heart:    Bradycardic. Irregularly irregular rhythm. No murmurs, rubs, or gallops.    MS:   All extremities are intact.    Neurologic:   Awake, alert, oriented x 3. No  apparent focal neurological defect.           Assessment & Plan      1. Pneumonia due to infectious organism, unspecified laterality, unspecified part of lung (Primary) Recent pneumonia treated with antibiotics. Symptoms improving but not fully resolved. CT scan performed. Lungs clear. - Continue antibiotics as prescribed. - Report any worsening or recurrence of symptoms after antibiotics completion.  2. COPD with acute exacerbation (HCC)   3. Chronic bronchitis, unspecified chronic bronchitis type (HCC) refill albuterol  (PROVENTIL ) (2.5 MG/3ML) 0.083% nebulizer solution; Take 3 mLs (2.5 mg total) by nebulization every 6 (six) hours as needed for wheezing or shortness of breath.  Dispense: 150 mL; Refill: 2  4. Dermatitis Refill metronidazole  cream        Nancyann Perry, MD  [facility] [phone] (phone) [phone] (fax)  [facility]

## 2024-06-23 NOTE — Progress Notes (Signed)
 Established patient visit   Patient: Lawrence [name]   DOB: Mar 19, 1956   67 y.o. Male  MRN: [id number] Visit Date: 06/23/2024  Today's healthcare provider: Nancyann Perry, MD   Chief Complaint  Patient presents with   Follow-up    Hospital follow up, dizziness and chest pain. States he is still having this going on    Emesis    Onset this morning, patient states vomit is a green color. Had nausea as well, some abd pain. States this happened a few weeks ago and he was dx with pneumonia. Patient was taken off lasix  in hospital but accidentally took it this morning    Subjective    Discussed the use of AI scribe software for clinical note transcription with the patient, who gave verbal consent to proceed.  History of Present Illness   Lawrence [name] is a 68 year old male who presents for a hospital follow-up after recent admissions for a syncopal episode and chest pain.  He was admitted to [facility] from July 25th to July 29th, 2025, for a syncopal episode. During this admission, Entresto , spironolactone , and furosemide  were held. He was subsequently admitted to [facility] on August 2nd, 2025, with chest pain, where myocardial infarction was ruled out with normal troponins. Metoprolol  was withheld due to bradycardia with a heart rate of 37.  His blood pressure has improved from 60/40 to 130s over 80s, which he feels is better. He is currently not taking Entresto , metoprolol , or spironolactone . He accidentally took furosemide  this morning, leading to nausea and vomiting of green material. He has not eaten or drunk anything today.  He experiences chills without fever, which started early this morning. He also has a mild cough, primarily upon waking, and uses a CPAP machine at night, which he finds beneficial. He is currently taking midodrine  2.5 mg three times a day.  He has a history of allergic reactions to certain antibiotics, including a severe reaction to a sulfa antibiotic that  caused blistering. He has also taken doxycycline  and amoxicillin  in the past, with a noted reaction to one of these medications causing a rash that required emergency care.  He has experienced nausea and vomiting today, similar to a previous episode associated with pneumonia, but less severe. He states he took furosemide  by accident this morning and N&V started soon thereafter.       Medications: Outpatient Medications Prior to Visit  Medication Sig   Accu-Chek Softclix Lancets lancets Use as instructed to check sugar daily for type 2 diabetes.   albuterol  (PROVENTIL ) (2.5 MG/3ML) 0.083% nebulizer solution Take 3 mLs (2.5 mg total) by nebulization every 6 (six) hours as needed for wheezing or shortness of breath.   allopurinol  (ZYLOPRIM ) 300 MG tablet TAKE 1 TABLET BY MOUTH TWICE A DAY   ALPRAZolam  (XANAX ) 1 MG tablet TAKE 1 TABLET BY MOUTH 3 TIMES A DAY AS NEEDED   aluminum -magnesium  hydroxide-simethicone  (MAALOX) 200-200-20 MG/5ML SUSP Take 30 mLs by mouth 3 (three) times daily as needed.   apixaban  (ELIQUIS ) 5 MG TABS tablet Take 1 tablet (5 mg total) by mouth 2 (two) times daily.   atorvastatin  (LIPITOR ) 80 MG tablet TAKE 1 TABLET BY MOUTH AT BEDTIME   Blood Glucose Calibration (ACCU-CHEK GUIDE CONTROL) LIQD Use with blood glucose monitor as directed   Blood Glucose Monitoring Suppl (ACCU-CHEK GUIDE) w/Device KIT Use to check blood sugars as directed   BREZTRI  AEROSPHERE 160-9-4.8 MCG/ACT AERO inhaler INHALE 2 PUFFS BY MOUTH INTO  THE LUNGS 2TIMES DAILY   cetirizine  (ZYRTEC ) 10 MG tablet Take 1 tablet (10 mg total) by mouth at bedtime.   Cyanocobalamin  (VITAMIN B-12) 1000 MCG SUBL Place 1 tablet under the tongue daily.   dapagliflozin  propanediol (FARXIGA ) 10 MG TABS tablet TAKE 1 TABLET BY MOUTH DAILY   famotidine  (PEPCID ) 20 MG tablet Take 1 tablet (20 mg total) by mouth 2 (two) times daily.   glucose blood (ACCU-CHEK GUIDE) test strip Use as instructed to check sugar daily for type 2  diabetes.   iron  polysaccharides (FERREX 150) 150 MG capsule Take one capsul every Monday, Wednesday, and Friday   isosorbide  mononitrate (IMDUR ) 120 MG 24 hr tablet Take 1 tablet (120 mg total) by mouth daily.   lithium  carbonate 150 MG capsule Take 150 mg by mouth 2 (two) times daily.   metFORMIN  (GLUCOPHAGE -XR) 500 MG 24 hr tablet TAKE 1 TABLET BY MOUTH DAILY WITH EVENING MEAL   methocarbamol  (ROBAXIN ) 500 MG tablet TAKE 1 TABLET BY MOUTH EVERY 8 HOURS AS NEEDED FOR MUSCLE SPASMS   midodrine  (PROAMATINE ) 2.5 MG tablet Take 1 tablet (2.5 mg total) by mouth 3 (three) times daily with meals.   nitroGLYCERIN  (NITROSTAT ) 0.4 MG SL tablet Place 1 tablet (0.4 mg total) under the tongue every 5 (five) minutes as needed for chest pain (Do not take if blood pressure is low, systolic BP<110).   omega-3 acid ethyl esters (LOVAZA ) 1 g capsule TAKE 4 CAPSULES (4 GRAMS TOTAL) BY MOUTHDAILY   oxyCODONE -acetaminophen  (PERCOCET) 10-325 MG tablet TAKE 1 TABLET BY MOUTH EVERY 4 HOURS AS NEEDED FOR PAIN   OXYGEN  Inhale 2 L into the lungs at bedtime as needed (for shortness of breath).   pantoprazole  (PROTONIX ) 40 MG tablet TAKE 1 TABLET BY MOUTH 2 TIMES DAILY (30MINUTES BEFORE A MEAL)   ranolazine  (RANEXA ) 1000 MG SR tablet Take 1 tablet (1,000 mg total) by mouth 2 (two) times daily.   sennosides-docusate sodium  (SENOKOT-S) 8.6-50 MG tablet Take 1 tablet by mouth daily as needed for constipation.   traZODone  (DESYREL ) 150 MG tablet Take 1 tablet (150 mg total) by mouth at bedtime as needed for sleep.   TRINTELLIX  10 MG TABS tablet Take 10 mg by mouth daily.   furosemide  (LASIX ) 20 MG tablet Take 20 mg by mouth. (Patient not taking: Reported on 06/23/2024)   metoprolol  succinate (TOPROL -XL) 25 MG 24 hr tablet Take 1 tablet (25 mg total) by mouth daily. (Patient not taking: Reported on 06/23/2024)   [Paused] sacubitril -valsartan  (ENTRESTO ) 24-26 MG Take 1 tablet by mouth 2 (two) times daily. (Patient not taking: Reported  on 06/23/2024)   [Paused] spironolactone  (ALDACTONE ) 25 MG tablet Take 1 tablet (25 mg total) by mouth daily. (Patient not taking: Reported on 06/23/2024)   No facility-administered medications prior to visit.   Review of Systems     Objective    BP (!) 138/98 (BP Location: Left Arm, Patient Position: Sitting, Cuff Size: Normal)   Pulse 98   Temp 97.7 F (36.5 C) (Oral)   Ht 5' 7 (1.702 m)   Wt 177 lb 14.4 oz (80.7 kg)   SpO2 99%   BMI 27.86 kg/m   Physical Exam   General: Appearance:    Well developed, well nourished male in no acute distress  Eyes:    PERRL, conjunctiva/corneas clear, EOM's intact       Lungs:     Clear to auscultation bilaterally, respirations unlabored  Heart:    Normal heart rate. Irregularly irregular rhythm.  No murmurs, rubs, or gallops.    MS:   All extremities are intact.    Neurologic:   Awake, alert, oriented x 3. No apparent focal neurological defect.         Assessment & Plan     1. Bilious vomiting with nausea (Primary) Onset a few hours ago after taking furosemide . He had one episode of billous vomiting in the office. He felt improved and able to sip water without difficulty after episode. Discussed proceeding to ER for further evaluation, but he felt better by the time he left office. He is to call 911 if symptoms return. He did report having similar sx when he was diagnosed with pneumonia a few months. Ago.   - ondansetron  (ZOFRAN ) 4 MG tablet; Take 1 tablet (4 mg total) by mouth every 8 (eight) hours as needed for nausea or vomiting.  Dispense: 10 tablet; Refill: 0   - CBC - Comprehensive metabolic panel with GFR - DG Chest 2 View; Future  2. Chills  - DG Chest 2 View; Future  - azithromycin  (ZITHROMAX ) 250 MG tablet; Take 2 tablets on day 1, then 1 tablet daily on days 2 through 5  Dispense: 6 tablet; Refill: 0 3. Orthostatic hypotension  Resolved since entresto , spironolactone , and furosemide  put on hold, but BP is now above goal  consider starting back on lower dose of entresto  or spironolactone  after reviweing labs.   4. Atrial fibrillation, chronic (HCC) On DOAC, rate controlled.   5. Chronic combined systolic (congestive) and diastolic (congestive) heart failure (HCC) Well compensated today. No sign of acute hf today.    Addressed extensive list of chronic and acute medical problems today requiring 45 minutes reviewing his medical record, counseling patient regarding his conditions and coordination of care.        Nancyann Perry, MD  [facility] [phone] (phone) [phone] (fax)  [facility]

## 2024-06-24 LAB — CBC
Hematocrit: 54 % — ABNORMAL HIGH (ref 37.5–51.0)
Hemoglobin: 17.5 g/dL (ref 13.0–17.7)
MCH: 30.1 pg (ref 26.6–33.0)
MCHC: 32.4 g/dL (ref 31.5–35.7)
MCV: 93 fL (ref 79–97)
Platelets: 146 x10E3/uL — ABNORMAL LOW (ref 150–450)
RBC: 5.82 x10E6/uL — ABNORMAL HIGH (ref 4.14–5.80)
RDW: 16.9 % — ABNORMAL HIGH (ref 11.6–15.4)
WBC: 6.4 x10E3/uL (ref 3.4–10.8)

## 2024-06-24 LAB — COMPREHENSIVE METABOLIC PANEL WITH GFR
ALT: 20 IU/L (ref 0–44)
AST: 16 IU/L (ref 0–40)
Albumin: 4.3 g/dL (ref 3.9–4.9)
Alkaline Phosphatase: 73 IU/L (ref 44–121)
BUN/Creatinine Ratio: 16 (ref 10–24)
BUN: 20 mg/dL (ref 8–27)
Bilirubin Total: 0.9 mg/dL (ref 0.0–1.2)
CO2: 23 mmol/L (ref 20–29)
Calcium: 10 mg/dL (ref 8.6–10.2)
Chloride: 98 mmol/L (ref 96–106)
Creatinine, Ser: 1.28 mg/dL — ABNORMAL HIGH (ref 0.76–1.27)
Globulin, Total: 2 g/dL (ref 1.5–4.5)
Glucose: 144 mg/dL — ABNORMAL HIGH (ref 70–99)
Potassium: 4.3 mmol/L (ref 3.5–5.2)
Sodium: 140 mmol/L (ref 134–144)
Total Protein: 6.3 g/dL (ref 6.0–8.5)
eGFR: 60 mL/min/1.73 (ref >59)

## 2024-06-26 ENCOUNTER — Telehealth: Payer: Self-pay

## 2024-06-26 NOTE — Telephone Encounter (Signed)
 Copied from CRM [id number]. Topic: Clinical - Home Health Verbal Orders >> Jun 23, 2024  4:28 PM Nathanel BROCKS wrote: Home health called, Tiffany with [facility] stated thatt Dr Gasper would need to sign the verbal orders since he is the provider. Dr Donzella signed off on them but they are going to resend for Dr Gasper to sign. Please. Tiffany [phone]

## 2024-06-26 NOTE — Telephone Encounter (Signed)
 That's fine

## 2024-06-28 ENCOUNTER — Ambulatory Visit
Admission: RE | Admit: 2024-06-28 | Discharge: 2024-06-28 | Disposition: A | Discharge status: Home / Self Care | Source: Ambulatory Visit | Attending: Family Medicine | Admitting: Family Medicine

## 2024-06-28 ENCOUNTER — Encounter: Admitting: Family

## 2024-06-28 ENCOUNTER — Ambulatory Visit: Payer: Self-pay | Admitting: Family Medicine

## 2024-06-28 ENCOUNTER — Ambulatory Visit
Admission: RE | Admit: 2024-06-28 | Discharge: 2024-06-28 | Disposition: A | Discharge status: Home / Self Care | Attending: Family Medicine | Admitting: Family Medicine

## 2024-06-28 DIAGNOSIS — R1114 Bilious vomiting: Secondary | ICD-10-CM

## 2024-06-28 DIAGNOSIS — R6883 Chills (without fever): Secondary | ICD-10-CM

## 2024-06-29 ENCOUNTER — Emergency Department
Admission: EM | Admit: 2024-06-29 | Discharge: 2024-06-30 | Disposition: A | Discharge status: Home / Self Care | Attending: Emergency Medicine | Admitting: Emergency Medicine

## 2024-06-29 ENCOUNTER — Other Ambulatory Visit: Payer: Self-pay

## 2024-06-29 ENCOUNTER — Ambulatory Visit: Payer: Self-pay

## 2024-06-29 ENCOUNTER — Other Ambulatory Visit: Payer: Self-pay | Admitting: Family Medicine

## 2024-06-29 ENCOUNTER — Encounter: Payer: Self-pay | Admitting: Emergency Medicine

## 2024-06-29 DIAGNOSIS — F418 Other specified anxiety disorders: Secondary | ICD-10-CM

## 2024-06-29 DIAGNOSIS — I160 Hypertensive urgency: Secondary | ICD-10-CM

## 2024-06-29 DIAGNOSIS — R600 Localized edema: Secondary | ICD-10-CM | POA: Diagnosis not present

## 2024-06-29 DIAGNOSIS — M7989 Other specified soft tissue disorders: Secondary | ICD-10-CM | POA: Diagnosis present

## 2024-06-29 DIAGNOSIS — R079 Chest pain, unspecified: Secondary | ICD-10-CM | POA: Diagnosis present

## 2024-06-29 DIAGNOSIS — I5043 Acute on chronic combined systolic (congestive) and diastolic (congestive) heart failure: Secondary | ICD-10-CM

## 2024-06-29 DIAGNOSIS — I5042 Chronic combined systolic (congestive) and diastolic (congestive) heart failure: Secondary | ICD-10-CM | POA: Diagnosis not present

## 2024-06-29 DIAGNOSIS — J9611 Chronic respiratory failure with hypoxia: Secondary | ICD-10-CM

## 2024-06-29 DIAGNOSIS — I482 Chronic atrial fibrillation, unspecified: Secondary | ICD-10-CM | POA: Diagnosis not present

## 2024-06-29 DIAGNOSIS — R55 Syncope and collapse: Secondary | ICD-10-CM

## 2024-06-29 DIAGNOSIS — I509 Heart failure, unspecified: Secondary | ICD-10-CM | POA: Insufficient documentation

## 2024-06-29 LAB — COMPREHENSIVE METABOLIC PANEL WITH GFR
ALT: 13 U/L (ref 0–44)
AST: 16 U/L (ref 15–41)
Albumin: 3.2 g/dL — ABNORMAL LOW (ref 3.5–5.0)
Alkaline Phosphatase: 49 U/L (ref 38–126)
Anion gap: 8 (ref 5–15)
BUN: 12 mg/dL (ref 8–23)
CO2: 28 mmol/L (ref 22–32)
Calcium: 9.6 mg/dL (ref 8.9–10.3)
Chloride: 102 mmol/L (ref 98–111)
Creatinine, Ser: 0.99 mg/dL (ref 0.61–1.24)
GFR, Estimated: >60 mL/min (ref >60)
Glucose, Bld: 116 mg/dL — ABNORMAL HIGH (ref 70–99)
Potassium: 3.2 mmol/L — ABNORMAL LOW (ref 3.5–5.1)
Sodium: 138 mmol/L (ref 135–145)
Total Bilirubin: 0.9 mg/dL (ref 0.0–1.2)
Total Protein: 6.2 g/dL — ABNORMAL LOW (ref 6.5–8.1)

## 2024-06-29 LAB — CBC WITH DIFFERENTIAL/PLATELET
Abs Immature Granulocytes: 0.03 K/uL (ref 0.00–0.07)
Basophils Absolute: 0 K/uL (ref 0.0–0.1)
Basophils Relative: 0 %
Eosinophils Absolute: 0.1 K/uL (ref 0.0–0.5)
Eosinophils Relative: 3 %
HCT: 45.7 % (ref 39.0–52.0)
Hemoglobin: 14.8 g/dL (ref 13.0–17.0)
Immature Granulocytes: 1 %
Lymphocytes Relative: 25 %
Lymphs Abs: 1.3 K/uL (ref 0.7–4.0)
MCH: 29.5 pg (ref 26.0–34.0)
MCHC: 32.4 g/dL (ref 30.0–36.0)
MCV: 91.2 fL (ref 80.0–100.0)
Monocytes Absolute: 0.5 K/uL (ref 0.1–1.0)
Monocytes Relative: 9 %
Neutro Abs: 3.2 K/uL (ref 1.7–7.7)
Neutrophils Relative %: 62 %
Platelets: 153 K/uL (ref 150–400)
RBC: 5.01 MIL/uL (ref 4.22–5.81)
RDW: 17.1 % — ABNORMAL HIGH (ref 11.5–15.5)
WBC: 5.1 K/uL (ref 4.0–10.5)
nRBC: 0 % (ref 0.0–0.2)

## 2024-06-29 LAB — URINALYSIS, ROUTINE W REFLEX MICROSCOPIC
Bilirubin Urine: NEGATIVE
Glucose, UA: >=500 mg/dL — AB
Hgb urine dipstick: NEGATIVE
Ketones, ur: NEGATIVE mg/dL
Leukocytes,Ua: NEGATIVE
Nitrite: NEGATIVE
Protein, ur: NEGATIVE mg/dL
Specific Gravity, Urine: 1.012 (ref 1.005–1.030)
Squamous Epithelial / HPF: 0 /HPF (ref 0–5)
pH: 5 (ref 5.0–8.0)

## 2024-06-29 LAB — BRAIN NATRIURETIC PEPTIDE: B Natriuretic Peptide: 466 pg/mL — ABNORMAL HIGH (ref 0.0–100.0)

## 2024-06-29 NOTE — Telephone Encounter (Signed)
-----   Message from [facility], MD sent at 06/29/2024  8:36 AM EDT ----- Patient should increase by or start supplementing with 1000 units of Vitamin D3 once a day. Patient should get Vit D checked in 3 months to make sure levels have improved.  A slightly low Beta-Amyloid 42/40 ratio may be seen in patients with Alzheimer's Disease pathology in their brain. Alzheimer's Disease pathology may start many years before clinical symptoms start.  The presence of pathology may not guarantee that you will develop the disease. Lifestyle Medicine can improve your odds. We will discuss more at the next visit.  APOE genetic lab does not diagnose Alzheimer's Disease.  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   The rest of the labs are ok with minor variations.  ----- Message ----- From: Lab, Background User Sent: 06/07/2024   5:33 PM EDT To: Jannett Mickie Fairly, MD

## 2024-06-29 NOTE — ED Triage Notes (Signed)
 Patient c/o right lower leg swelling and pain that started today.  Patient's leg is not red or warm to the touch.  Patient has non-pitting edema to right lower leg.  Patient gives verbal consent for MSE.

## 2024-06-29 NOTE — Telephone Encounter (Signed)
 Reason for Disposition . [1] Follow-up call to recent contact AND [2] information only call, no triage required  Answer Assessment - Initial Assessment Questions 1. REASON FOR CALL: What is the main reason for your call? or How can I best help you?     TOC nurse calling to confirm patient medication list  2. SYMPTOMS : Do you have any symptoms?      Elevated BP  3. OTHER QUESTIONS: Do you have any other questions?     Pt also need HH orders signed by PCP  Protocols used: Information Only Call - No Triage-A-AH

## 2024-06-29 NOTE — Telephone Encounter (Addendum)
 FYI Only or Action Required?: Action required by provider: Need PCP signature for [facility]. Per chart review, Tiffany with [facility] [phone]) resent orders for PCP to signe .  Patient was last seen in primary care on 06/23/2024 by [name], MD.  Called Nurse Triage reporting Hypertension.  Symptoms began today.  Interventions attempted: Prescription medications: Imdur .  Symptoms are: stable.  Triage Disposition: Information or Advice Only Call  Patient/caregiver understands and will follow disposition?: Yes FYI- Call rec'd [facility] Nurse [place] advising that patient's BP was elevated and that he has not taken any of his BP meds. Per [place], pts family held BP meds based off of DC instruction from 8/2 hospitalization. Pt had not taken Imdur , Spironolactone , Entresto , Metoprolol , or Lasix  since leaving the hospital. Per pts 8/8 visit with PCP patient was to resume taking Imdur , Metoprolol , and Lasix  but had not.  Nurse [place] instructed patient to take Imdur  and Metroprolol as prescribed, but was also needing signed HHC orders for continuous care.   Copied from CRM #[id number]. Topic: Clinical - Red Word Triage >> Jun 29, 2024  2:36 PM Avram MATSU wrote: Red Word that prompted transfer to Nurse Triage: blood pressure is high 167/106 Answer Assessment - Initial Assessment Questions 1. BLOOD PRESSURE: What is your blood pressure? Did you take at least two measurements 5 minutes apart?     Yes, 161/108 and then 167/106  2. ONSET: When did you take your blood pressure?     This after  3. HOW: How did you take your blood pressure? (e.g., automatic home BP monitor, visiting nurse)     Visiting nurse  4. HISTORY: Do you have a history of high blood pressure?     Yes  5. MEDICINES: Are you taking any medicines for blood pressure? Have you missed any doses recently?     No, there was some confusion with DC medications so patient has been off of all BP medication  6. OTHER  SYMPTOMS: Do you have any symptoms? (e.g., blurred vision, chest pain, difficulty breathing, headache, weakness)     nausea  Protocols used: Blood Pressure - High-A-AH

## 2024-06-29 NOTE — Telephone Encounter (Signed)
 Patient informed.

## 2024-06-29 NOTE — Telephone Encounter (Signed)
 Copied from CRM [id number]. Topic: Clinical - Medication Question >> Jun 29, 2024  3:25 PM Delon HERO wrote: Reason for CRM: Patient is calling to report that his blood pressure is elevated. And he was advised to return back to metoprolol  succinate (TOPROL -XL) 25 MG 24 hr tablet [[id number]]. No documentation of who advised him of this. Patient is reporting that he is in need of a new script. Please advise

## 2024-06-29 NOTE — Telephone Encounter (Unsigned)
 Copied from CRM [id number]. Topic: Clinical - Medication Refill >> Jun 29, 2024  3:31 PM Leah C wrote: Medication: metoprolol  succinate (TOPROL -XL) 25 MG 24 hr tablet  Has the patient contacted their pharmacy? Patient stated that he called the pharmacy and they advised patient that he would need to contact clinic for a new script. Patient's blood pressure has been elevated.   This is the patient's preferred pharmacy:  [facility] - [place], [place] - [address] [place] [address] Phone: [phone] Fax: [phone]   Is this the correct pharmacy for this prescription? Yes If no, delete pharmacy and type the correct one.   Has the prescription been filled recently? No  Is the patient out of the medication? Yes  Has the patient been seen for an appointment in the last year OR does the patient have an upcoming appointment? Yes  Can we respond through MyChart? No  Agent: Please be advised that Rx refills may take up to 3 business days. We ask that you follow-up with your pharmacy.

## 2024-06-29 NOTE — Transitions of Care (Post Inpatient/ED Visit) (Addendum)
 Transition of Care Week 5  Visit Note  06/29/2024  Name: Lawrence [name] MRN: [id number]          DOB: 1956-09-18  Situation: Patient enrolled in [facility] 30-day program. Visit completed with patient and grandson by telephone.   Background:   Initial Transition Care Management Follow-up Telephone Call    Past Medical History:  Diagnosis Date   A-fib (HCC)    Anemia    Anginal pain (HCC)    Anxiety    Arthritis    Asthma    CAD (coronary artery disease)    a. 2002 CABGx2 (LIMA->LAD, VG->VG->OM1);  b. 09/2012 DES->OM;  c. 03/2015 PTCA of LAD [facility]) in setting of atretic LIMA; d. 05/2015 Cath [facility]): nonobs dzs; e. 06/2015 Cath ([facility]): LM nl, LAD 45p/d ISR, 50d, D1/2 small, LCX 50p/d ISR, OM1 70ost, 30 ISR, VG->OM1 50ost, 17m, LIMA->LAD 99p/d - atretic, RCA dom, nl; f.cath 10/16: 40-50%(FFR 0.90) pLAD, 75% (FFR 0.77) mLAD s/p PCI/DES, oRCA 40% (FFR0.95)   Cancer (HCC)    SKIN CANCER ON BACK   Celiac disease    Chronic diastolic CHF (congestive heart failure) (HCC)    a. 06/2009 Echo: EF 60-65%, Gr 1 DD, triv AI, mildly dil LA, nl RV.   COPD (chronic obstructive pulmonary disease) (HCC)    a. Chronic bronchitis and emphysema.   DDD (degenerative disc disease), lumbar    Diverticulosis    Dysrhythmia    Essential hypertension    GERD (gastroesophageal reflux disease)    History of hiatal hernia    History of kidney stones    H/O   History of tobacco abuse    a. Quit 2014.   Myocardial infarction [facility]) 2002   4 STENTS   Pancreatitis    PSVT (paroxysmal supraventricular tachycardia) (HCC)    a. 10/2012 Noted on Zio Patch.   Sleep apnea    LOST CORD TO CPAP -ONLY 02 @ BEDTIME   Tubular adenoma of colon    Type II diabetes mellitus (HCC)     Assessment: Patient Reported Symptoms: Cognitive    Alert and oriented to person, place, and time; Normal speech and language skills; Requires Assistance Decision Making; Struggling with memory recall     Neurological Neurological Review of  Symptoms: Weakness Oher Neurological Symptoms/Conditions [RPT]: generalized weakness Neurological Management Strategies: Medical device, Routine screening  HEENT  No symptoms reported       Cardiovascular Patient's Recent BP reading at home:  (Contacted MD office spoke with triage nurse, Cherelle) Home BP monitoring Weight: 179 lb (81.2 kg) BP 161/108 rechecked 167/106  Respiratory    No symptoms reported  Endocrine  No symptoms reported Is patient checking blood sugars at home? Yes 131    Gastrointestinal  Nausea and vomiting      Genitourinary Genitourinary Symptoms Reported: Incontinence (chronic) Additional Genitourinary Details: especially when taking Furosemide  Genitourinary Management Strategies: Incontinence garment/pad Genitourinary Self-Management Outcome: 4 (good)  Integumentary Integumentary Symptoms Reported: No symptoms reported    Musculoskeletal Musculoskelatal Symptoms Reviewed: Limited mobility, Unsteady gait, Weakness Additional Musculoskeletal Details: generalized weakness - chronic Musculoskeletal Management Strategies: Activity, Adequate rest, Medication therapy Musculoskeletal Self-Management Outcome: 4 (good) Falls in the past year?: Yes Number of falls in past year: 2 or more Was there an injury with Fall?: No Fall Risk Category Calculator: 2 Patient Fall Risk Level: Moderate Fall Risk    Psychosocial Psychosocial Symptoms Reported: Anxiety - if selected complete GAD, Depression - if selected complete PHQ 2-9 Additional Psychological Details: Medications  Behavioral Management Strategies: Counseling, Medication therapy, Support system (I have a 3 pm face to face with my therapist today)       Vitals:   06/29/24 1321  BP: (!) 161/108    [name] Drown is assisting patient with medication management however was filling medications from 06/19/24 AVS and patient had updated medications list from PCP on 06/23/24.  Medications Reviewed Today     Reviewed  by Eilleen Richerd GRADE, RN (Registered Nurse) on 06/29/24 at 1851  Med List Status: <None>   Medication Order Taking? Sig Documenting Provider Last Dose Status Informant  Accu-Chek Softclix Lancets lancets [id number]  Use as instructed to check sugar daily for type 2 diabetes. [name], MD  Active Self  albuterol  (PROVENTIL ) (2.5 MG/3ML) 0.083% nebulizer solution [id number]  Take 3 mLs (2.5 mg total) by nebulization every 6 (six) hours as needed for wheezing or shortness of breath. [name], MD  Active Self  allopurinol  (ZYLOPRIM ) 300 MG tablet [id number]  TAKE 1 TABLET BY MOUTH TWICE A DAY [name], MD  Active Self  ALPRAZolam  (XANAX ) 1 MG tablet [id number]  TAKE 1 TABLET BY MOUTH 3 TIMES A DAY AS NEEDED [name], MD  Active Self  aluminum -magnesium  hydroxide-simethicone  (MAALOX) 200-200-20 MG/5ML SUSP [id number]  Take 30 mLs by mouth 3 (three) times daily as needed. [name], MD  Active Self  apixaban  (ELIQUIS ) 5 MG TABS tablet [id number]  Take 1 tablet (5 mg total) by mouth 2 (two) times daily. [name], MD  Active Self  atorvastatin  (LIPITOR ) 80 MG tablet [id number]  TAKE 1 TABLET BY MOUTH AT BEDTIME [name], MD  Active Self  Blood Glucose Calibration (ACCU-CHEK GUIDE CONTROL) [name] [id number]  Use with blood glucose monitor as directed [name], MD  Active Self  Blood Glucose Monitoring Suppl (ACCU-CHEK GUIDE) w/Device KIT [id number]  Use to check blood sugars as directed [name], MD  Active Self  BREZTRI  AEROSPHERE 160-9-4.8 MCG/ACT AERO inhaler [id number]  INHALE 2 PUFFS BY MOUTH INTO THE LUNGS 2TIMES DAILY [name], MD  Active Self  cetirizine  (ZYRTEC ) 10 MG tablet [id number]  Take 1 tablet (10 mg total) by mouth at bedtime. [name], MD  Active Self  cholecalciferol  (VITAMIN D3) 25 MCG (1000 UNIT) tablet [id number] Yes Take 1,000 Units by mouth daily. [provider]  Active   Cyanocobalamin  (VITAMIN B-12) 1000  MCG SUBL [id number]  Place 1 tablet under the tongue daily. [provider]  Active Self  dapagliflozin  propanediol (FARXIGA ) 10 MG TABS tablet [id number]  TAKE 1 TABLET BY MOUTH DAILY [name], MD  Active Self  famotidine  (PEPCID ) 20 MG tablet [id number]  Take 1 tablet (20 mg total) by mouth 2 (two) times daily. [name], MD  Active Self  furosemide  (LASIX ) 20 MG tablet [id number]  Take 20 mg by mouth.  Patient not taking: Reported on 06/23/2024   [provider]  Active   glucose blood (ACCU-CHEK GUIDE) test strip [id number]  Use as instructed to check sugar daily for type 2 diabetes. [name], MD  Active Self  iron  polysaccharides (FERREX 150) 150 MG capsule [id number]  Take one capsul every Monday, Wednesday, and Friday [name], MD  Active Self  isosorbide  mononitrate (IMDUR ) 120 MG 24 hr tablet [id number]  Take 1 tablet (120 mg total) by mouth daily. Von Bellis, MD  Active Self  Med Note BEVERLEE, JOYCE   Sat Jun 10, 2024 12:01 AM) Pt taking 60 mg  lithium  carbonate 150 MG capsule [id number]  Take 150 mg by mouth 2 (two) times daily. [provider]  Active Self  metFORMIN  (GLUCOPHAGE -XR) 500 MG 24 hr tablet [id number]  TAKE 1 TABLET BY MOUTH DAILY WITH EVENING MEAL [name], MD  Active Self  methocarbamol  (ROBAXIN ) 500 MG tablet [id number]  TAKE 1 TABLET BY MOUTH EVERY 8 HOURS AS NEEDED FOR MUSCLE SPASMS [name], MD  Active Self  metoprolol  succinate (TOPROL -XL) 25 MG 24 hr tablet [id number]  Take 1 tablet (25 mg total) by mouth daily.  Patient not taking: Reported on 06/23/2024   Alexander, Natalie, DO  Active   midodrine  (PROAMATINE ) 2.5 MG tablet [id number]  Take 1 tablet (2.5 mg total) by mouth 3 (three) times daily with meals. Alexander, Natalie, DO  Active   nitroGLYCERIN  (NITROSTAT ) 0.4 MG SL tablet [id number]  Place 1 tablet (0.4 mg total) under the tongue every 5 (five) minutes as needed for chest pain (Do not take  if blood pressure is low, systolic BP<110). [name], MD  Active Self           Med Note HALLIE, [facility]   Tue Mar 21, 2024 12:58 PM) Needs new order  omega-3 acid ethyl esters (LOVAZA ) 1 g capsule [id number]  TAKE 4 CAPSULES (4 GRAMS TOTAL) BY WARDEN [name], MD  Active Self  ondansetron  (ZOFRAN ) 4 MG tablet [id number]  Take 1 tablet (4 mg total) by mouth every 8 (eight) hours as needed for nausea or vomiting. [name], MD  Active   oxyCODONE -acetaminophen  (PERCOCET) 10-325 MG tablet [id number]  TAKE 1 TABLET BY MOUTH EVERY 4 HOURS AS NEEDED FOR PAIN [name], MD  Active   OXYGEN  [id number]  Inhale 2 L into the lungs at bedtime as needed (for shortness of breath). [provider]  Active Self  pantoprazole  (PROTONIX ) 40 MG tablet [id number]  TAKE 1 TABLET BY MOUTH 2 TIMES DAILY (30MINUTES BEFORE A MEAL) [name], MD  Active Self  ranolazine  (RANEXA ) 1000 MG SR tablet [id number]  Take 1 tablet (1,000 mg total) by mouth 2 (two) times daily. Darci Pore, MD  Active Self  sacubitril -valsartan  (ENTRESTO ) 24-26 MG [id number]  Take 1 tablet by mouth 2 (two) times daily.  Patient not taking: Reported on 06/23/2024   [provider]  Active Self  sennosides-docusate sodium  (SENOKOT-S) 8.6-50 MG tablet [id number]  Take 1 tablet by mouth daily as needed for constipation. [name], MD  Active Self  spironolactone  (ALDACTONE ) 25 MG tablet [id number]  Take 1 tablet (25 mg total) by mouth daily.  Patient not taking: Reported on 06/23/2024   [name], MD  Active Self  traZODone  (DESYREL ) 150 MG tablet [id number]  Take 1 tablet (150 mg total) by mouth at bedtime as needed for sleep. [name], MD  Active Self  TRINTELLIX  10 MG TABS tablet [id number]  Take 10 mg by mouth daily. [provider]  Active Self  Med List Note [name], Richerd GRADE, RN 06/29/24 1843): 06/16/24 Reviewed with grandson, [name] with patient's permission  today 06/29/24 Medications reviewed with grandson, [name] who assist in managing patient's medications, Patient's medications list from 06/23/24 AVS reviewed and patient to restart Imdur  and Metoprolol . Patient didn't have Metoprolol . He is to check cabinet where his paused medications were.            Recommendation:  PCP Follow-up Home Health requests: Restart visits with [facility] spoke with [facility] Triage regarding orders for [facility]. Called [facility] at [phone] requesting a return call at 1:58 pm to check status and left a voicemail message Continue Current Plan of Care and medications follow up Pharmacy referral for complex medication regimen.  Follow Up Plan:   Telephone follow-up in 1 day To restart longitudinal RN/LCSW for Complex Care Coordination. Patient to see [facility] on 07/03/24.  Richerd Fish, RN, BSN, CCM [facility], [facility] RN Care Manager Direct Dial: [phone]

## 2024-06-29 NOTE — ED Triage Notes (Signed)
 Pt arrives via [facility] from home. Per reports, pt was taken off his lasix  and bp meds 2.5 weeks ago, restarted today. Today, his right side and right leg are swollen140/80, 99% ra, cbg 110, hr 100

## 2024-06-30 ENCOUNTER — Other Ambulatory Visit: Payer: Self-pay

## 2024-06-30 ENCOUNTER — Telehealth: Payer: Self-pay

## 2024-06-30 ENCOUNTER — Emergency Department

## 2024-06-30 ENCOUNTER — Telehealth: Payer: Self-pay | Admitting: Family

## 2024-06-30 ENCOUNTER — Encounter: Payer: Self-pay | Admitting: Emergency Medicine

## 2024-06-30 ENCOUNTER — Encounter: Payer: Self-pay | Admitting: Family Medicine

## 2024-06-30 DIAGNOSIS — I5042 Chronic combined systolic (congestive) and diastolic (congestive) heart failure: Secondary | ICD-10-CM | POA: Insufficient documentation

## 2024-06-30 DIAGNOSIS — I482 Chronic atrial fibrillation, unspecified: Secondary | ICD-10-CM | POA: Insufficient documentation

## 2024-06-30 DIAGNOSIS — R600 Localized edema: Secondary | ICD-10-CM | POA: Diagnosis not present

## 2024-06-30 LAB — BASIC METABOLIC PANEL WITH GFR
Anion gap: 8 (ref 5–15)
BUN: 14 mg/dL (ref 8–23)
CO2: 28 mmol/L (ref 22–32)
Calcium: 9.8 mg/dL (ref 8.9–10.3)
Chloride: 105 mmol/L (ref 98–111)
Creatinine, Ser: 1.21 mg/dL (ref 0.61–1.24)
GFR, Estimated: >60 mL/min (ref >60)
Glucose, Bld: 164 mg/dL — ABNORMAL HIGH (ref 70–99)
Potassium: 3.3 mmol/L — ABNORMAL LOW (ref 3.5–5.1)
Sodium: 141 mmol/L (ref 135–145)

## 2024-06-30 LAB — URINALYSIS, ROUTINE W REFLEX MICROSCOPIC
Bacteria, UA: NONE SEEN
Bilirubin Urine: NEGATIVE
Glucose, UA: >=500 mg/dL — AB
Hgb urine dipstick: NEGATIVE
Ketones, ur: NEGATIVE mg/dL
Leukocytes,Ua: NEGATIVE
Nitrite: NEGATIVE
Protein, ur: NEGATIVE mg/dL
Specific Gravity, Urine: 1.03 (ref 1.005–1.030)
Squamous Epithelial / HPF: 0 /HPF (ref 0–5)
pH: 6 (ref 5.0–8.0)

## 2024-06-30 LAB — CBC
HCT: 48.1 % (ref 39.0–52.0)
Hemoglobin: 15.7 g/dL (ref 13.0–17.0)
MCH: 29.4 pg (ref 26.0–34.0)
MCHC: 32.6 g/dL (ref 30.0–36.0)
MCV: 90.1 fL (ref 80.0–100.0)
Platelets: 172 K/uL (ref 150–400)
RBC: 5.34 MIL/uL (ref 4.22–5.81)
RDW: 16.8 % — ABNORMAL HIGH (ref 11.5–15.5)
WBC: 5.6 K/uL (ref 4.0–10.5)
nRBC: 0 % (ref 0.0–0.2)

## 2024-06-30 LAB — TROPONIN I (HIGH SENSITIVITY): Troponin I (High Sensitivity): 20 ng/L — ABNORMAL HIGH (ref <18)

## 2024-06-30 LAB — BRAIN NATRIURETIC PEPTIDE: B Natriuretic Peptide: 297.6 pg/mL — ABNORMAL HIGH (ref 0.0–100.0)

## 2024-06-30 NOTE — ED Notes (Signed)
 BIB [facility] from home for CP and A-fib acting up on him again per EMS.  120-130 HR w/ pain 18 G LFA 324 ASA Refused NTG 12 lead showed Afib

## 2024-06-30 NOTE — Transitions of Care (Post Inpatient/ED Visit) (Signed)
   06/30/2024  Name: Lawrence [name] MRN: [id number] DOB: Jun 21, 1956  Called patient regarding ED visit last night and medications changes from PCP. Patient states he received papers from ED visit and encouraged patient to follow list given of medications for follow up. Explained that a referral has been made to Pharmacy services to support medication management and understanding. Spoke with patient about possibly have medications pre-packaged and speak with pharmacist about it.  He has confirmation from DSS for transportation to appointment on Monday 07/03/24 with HF clinic and also to have them review it with [name] and obtained the latest copy of the medications.  Patient verbalized understanding. Spoke with [name] about getting the correct listed from patient's visits on 06/29/24 and he verbalized understanding.  Richerd Fish, RN, BSN, CCM [facility], [facility] RN Care Manager Direct Dial: [phone]

## 2024-06-30 NOTE — Progress Notes (Signed)
 This encounter was created in error - please disregard.

## 2024-06-30 NOTE — Addendum Note (Signed)
 Addended by: EILLEEN RICHERD GRADE on: 06/30/2024 03:00 PM   Modules accepted: Orders

## 2024-06-30 NOTE — Telephone Encounter (Signed)
 Called and spoke to the pt grandson Lawrence [name] to explain the medications to stop and start per Dr Gasper. He stated he understood and would make sure the changes has been made

## 2024-06-30 NOTE — ED Triage Notes (Signed)
 BIB [facility]s from home for cp that started around 1800.  Per pt I think my afib has kicked in and I may have some chf.  Pt a&ox4, respirations even and unlabored.

## 2024-06-30 NOTE — Telephone Encounter (Signed)
 Called to confirm/remind patient of their appointment at [facility] on 07/03/24.   Appointment:   [x] Confirmed  [] Left mess   [] No answer/No voice mail  [] VM Full/unable to leave message  [] Phone not in service  Patient reminded to bring all medications and/or complete list.  Confirmed patient has transportation. Gave directions, instructed to utilize valet parking.

## 2024-06-30 NOTE — Discharge Instructions (Signed)
 Your workup in the Emergency Department today was reassuring.  We did not find any specific abnormalities.  The swelling in your right leg is the same as it has been in the past.  We recommend you take your regular medications and follow up with the doctor(s) listed in these documents as recommended.  Return to the Emergency Department if you develop new or worsening symptoms that concern you.

## 2024-06-30 NOTE — ED Provider Notes (Signed)
 [facility] Provider Note    Event Date/Time   First MD Initiated Contact with Patient 06/29/24 2314     (approximate)   History   Leg Swelling   HPI Lawrence [name] is a 68 y.o. male well-known to the emergency department with 15 visits in the last 6 months.  He has extensive chronic medical issues including peripheral edema, CHF, and chronic pain.  He presents tonight for evaluation of right lower leg swelling and pain.  He has no redness or increased warmth.  There is a question about whether he has been taking his medications including his blood pressure medicine and his furosemide  after a recent hospitalization.  He has no chest pain or shortness of breath or abdominal pain.     Physical Exam   Triage Vital Signs: ED Triage Vitals [06/29/24 1946]  Encounter Vitals Group     BP 123/80     Girls Systolic BP Percentile      Girls Diastolic BP Percentile      Boys Systolic BP Percentile      Boys Diastolic BP Percentile      Pulse Rate (!) 103     Resp 18     Temp 98.1 F (36.7 C)     Temp Source Oral     SpO2 95 %     Weight 82 kg (180 lb 12.4 oz)     Height      Head Circumference      Peak Flow      Pain Score 9     Pain Loc      Pain Education      Exclude from Growth Chart     Most recent vital signs: Vitals:   06/29/24 1946 06/30/24 0111  BP: 123/80 126/78  Pulse: (!) 103 94  Resp: 18 17  Temp: 98.1 F (36.7 C) 98 F (36.7 C)  SpO2: 95% 97%    General: Awake, no distress.  CV:  Good peripheral perfusion.  Resp:  Normal effort. Speaking easily and comfortably, no accessory muscle usage nor intercostal retractions.  Lungs are clear to auscultation bilaterally. Abd:  No distention. No tenderness to palpation of the abdomen. Other:  Patient's right lower extremity is slightly larger in circumference than the patient's left lower extremity but neither leg has pitting edema.  There is no erythema or evidence of cellulitis.  No  tenderness to palpation along the venous distribution.   ED Results / Procedures / Treatments   Labs (all labs ordered are listed, but only abnormal results are displayed) Labs Reviewed  CBC WITH DIFFERENTIAL/PLATELET - Abnormal; Notable for the following components:      Result Value   RDW 17.1 (*)    All other components within normal limits  COMPREHENSIVE METABOLIC PANEL WITH GFR - Abnormal; Notable for the following components:   Potassium 3.2 (*)    Glucose, Bld 116 (*)    Total Protein 6.2 (*)    Albumin 3.2 (*)    All other components within normal limits  BRAIN NATRIURETIC PEPTIDE - Abnormal; Notable for the following components:   B Natriuretic Peptide 466.0 (*)    All other components within normal limits  URINALYSIS, ROUTINE W REFLEX MICROSCOPIC - Abnormal; Notable for the following components:   Color, Urine YELLOW (*)    APPearance CLEAR (*)    Glucose, UA >=500 (*)    Bacteria, UA RARE (*)    All other components within normal limits  PROCEDURES:  Critical Care performed: No  Procedures    IMPRESSION / MDM / ASSESSMENT AND PLAN / ED COURSE  I reviewed the triage vital signs and the nursing notes.                              Differential diagnosis includes, but is not limited to, peripheral edema, cellulitis, DVT, chronic pain.  Patient's presentation is most consistent with exacerbation of chronic illness.  Labs/studies ordered: CMP, BNP, urinalysis, CBC with differential  Interventions/Medications given:  Medications - No data to display  (Note:  hospital course my include additional interventions and/or labs/studies not listed above.)   Vital signs are stable within normal limits.  I reviewed the patient's medical record and I see several telephone encounters yesterday between some nurses and medical assistants.  There is apparently some confusion about whether he was supposed to be taking his medications including his furosemide  and  blood pressure medicine as an outpatient but it is clear he has not been since he was discharged from [facility] hospital about a week ago.  Fortunately, his medical screening examination tonight is reassuring with no evidence of an acute or emergent medical condition.  I have verified in the medical record that on one of his recent prior visits to this emergency department, he had exactly the same presentation of his right lower extremity and a reassuring ultrasound with no evidence of DVT.  I questioned him about this and he confirmed that he feels the same now as he did then.  There is no indication that we need to proceed with another ultrasound.  He is comfortable with the plan for discharge and outpatient follow-up and knows to take his medications.         FINAL CLINICAL IMPRESSION(S) / ED DIAGNOSES   Final diagnoses:  Peripheral edema     Rx / DC Orders   ED Discharge Orders     None        Note:  This document was prepared using Dragon voice recognition software and may include unintentional dictation errors.   Gordan Huxley, MD 06/30/24 [phone]

## 2024-06-30 NOTE — Telephone Encounter (Addendum)
 He should stay OFF of metoprolol . He should start back on Imdur  once a day and the furosemide  once a day. He should also start back on Entresto , but only take ONCE a day (instead of twice as day as he had been taking before). Stay OFF of spironolactone  for now).

## 2024-07-01 ENCOUNTER — Emergency Department
Admission: EM | Admit: 2024-07-01 | Discharge: 2024-07-01 | Disposition: A | Discharge status: Home / Self Care | Source: Home / Self Care | Attending: Emergency Medicine | Admitting: Emergency Medicine

## 2024-07-01 DIAGNOSIS — I5042 Chronic combined systolic (congestive) and diastolic (congestive) heart failure: Secondary | ICD-10-CM

## 2024-07-01 DIAGNOSIS — R079 Chest pain, unspecified: Secondary | ICD-10-CM

## 2024-07-01 DIAGNOSIS — R600 Localized edema: Secondary | ICD-10-CM | POA: Diagnosis not present

## 2024-07-01 DIAGNOSIS — I482 Chronic atrial fibrillation, unspecified: Secondary | ICD-10-CM

## 2024-07-01 LAB — TROPONIN I (HIGH SENSITIVITY): Troponin I (High Sensitivity): 17 ng/L (ref <18)

## 2024-07-01 NOTE — Discharge Instructions (Addendum)
 Please use ibuprofen (Motrin) up to 800 mg every 8 hours, naproxen (Naprosyn) up to 500 mg every 12 hours, and/or acetaminophen (Tylenol) up to 4 g/day for any continued pain.  Please do not use this medication regimen for longer than 7 days

## 2024-07-01 NOTE — ED Provider Notes (Signed)
 [facility] Provider Note   Event Date/Time   First MD Initiated Contact with Patient 07/01/24 [phone]     (approximate) History  Chest Pain  HPI Lawrence [name] is a 68 y.o. male with a history of chronic atrial fibrillation, CHF, and chronic pain presents for complaints of chest pain that began at approximately 6 PM this evening and has been stable since onset at 9/10.  Patient denies any exertional worsening of this pain.  Patient states that he has tried nitroglycerin  that has not had any effect.  Patient is concerned that atrial fibrillation has kicked in may have some CHF.  Patient also complains of bilateral lower extremity edema ROS: Patient currently denies any vision changes, tinnitus, difficulty speaking, facial droop, sore throat, shortness of breath, abdominal pain, nausea/vomiting/diarrhea, dysuria, or weakness/numbness/paresthesias in any extremity   Physical Exam  Triage Vital Signs: ED Triage Vitals  Encounter Vitals Group     BP 06/30/24 2113 (!) 133/96     Girls Systolic BP Percentile --      Girls Diastolic BP Percentile --      Boys Systolic BP Percentile --      Boys Diastolic BP Percentile --      Pulse Rate 07/01/24 0151 (!) 101     Resp 06/30/24 2113 18     Temp 06/30/24 2113 98.4 F (36.9 C)     Temp Source 06/30/24 2113 Oral     SpO2 06/30/24 2113 98 %     Weight 06/30/24 2114 181 lb (82.1 kg)     Height 06/30/24 2114 5' 7 (1.702 m)     Head Circumference --      Peak Flow --      Pain Score --      Pain Loc --      Pain Education --      Exclude from Growth Chart --    Most recent vital signs: Vitals:   07/01/24 0151 07/01/24 0347  BP: (!) 153/101   Pulse: (!) 101   Resp: 18   Temp: 98 F (36.7 C)   SpO2: 100% 98%   General: Awake, oriented x4. CV:  Good peripheral perfusion. Resp:  Normal effort. Abd:  No distention. Other:  Disheveled elderly overweight Caucasian male resting comfortably in no acute distress.   Scant edema to bilateral lower extremities ED Results / Procedures / Treatments  Labs (all labs ordered are listed, but only abnormal results are displayed) Labs Reviewed  BASIC METABOLIC PANEL WITH GFR - Abnormal; Notable for the following components:      Result Value   Potassium 3.3 (*)    Glucose, Bld 164 (*)    All other components within normal limits  CBC - Abnormal; Notable for the following components:   RDW 16.8 (*)    All other components within normal limits  BRAIN NATRIURETIC PEPTIDE - Abnormal; Notable for the following components:   B Natriuretic Peptide 297.6 (*)    All other components within normal limits  URINALYSIS, ROUTINE W REFLEX MICROSCOPIC - Abnormal; Notable for the following components:   Color, Urine YELLOW (*)    APPearance CLEAR (*)    Glucose, UA >=500 (*)    All other components within normal limits  TROPONIN I (HIGH SENSITIVITY) - Abnormal; Notable for the following components:   Troponin I (High Sensitivity) 20 (*)    All other components within normal limits  TROPONIN I (HIGH SENSITIVITY)   EKG ED ECG REPORT I,  Brittley Regner K Jermiah Howton, the attending physician, personally viewed and interpreted this ECG. Date: 07/01/2024 EKG Time: 2119 Rate: 109 Rhythm: Atrial fibrillation QRS Axis: normal Intervals: normal ST/T Wave abnormalities: normal Narrative Interpretation: Atrial fibrillation.  No evidence of acute ischemia RADIOLOGY ED MD interpretation: 2 view chest x-ray interpreted by me shows no evidence of acute abnormalities including no pneumonia, pneumothorax, or widened mediastinum.  Chronic bronchitic changes - All radiology independently interpreted and agree with radiology assessment Official radiology report(s): DG Chest 2 View Result Date: 06/30/2024 CLINICAL DATA:  Chest pain EXAM: CHEST - 2 VIEW COMPARISON:  06/28/2024, 06/09/2024, CT chest 06/10/2024 FINDINGS: Sternotomy. Chronic bronchitic changes. No acute airspace disease, pleural  effusion or pneumothorax. Normal cardiac size. Aortic atherosclerosis. IMPRESSION: No active cardiopulmonary disease. Chronic bronchitic changes. Electronically Signed   By: Luke Bun M.D.   On: 06/30/2024 23:15   PROCEDURES: Critical Care performed: No Procedures MEDICATIONS ORDERED IN ED: Medications - No data to display IMPRESSION / MDM / ASSESSMENT AND PLAN / ED COURSE  I reviewed the triage vital signs and the nursing notes.                             The patient is on the cardiac monitor to evaluate for evidence of arrhythmia and/or significant heart rate changes. Patient's presentation is most consistent with acute presentation with potential threat to life or bodily function. Patient is a 68 year old male with above-stated past medical history who presents complaining of chest pain Workup: ECG, CXR, CBC, BMP, Troponin Findings: ECG: No overt evidence of STEMI. No evidence of Brugada's sign, delta wave, epsilon wave, significantly prolonged QTc, or malignant arrhythmia HS Troponin: Negative x1 Other Labs unremarkable for emergent problems. CXR: Without PTX, PNA, or widened mediastinum Last Stress Test:  2023 Last Heart Catheterization:  2023 HEART Score: 4  Given History, Exam, and Workup I have low suspicion for ACS, Pneumothorax, Pneumonia, Pulmonary Embolus, Tamponade, Aortic Dissection or other emergent problem as a cause for this presentation.  Ambulatory referral placed to cardiology Disposition:  Discharge. Strict return precautions discussed with patient with full understanding. Advised patient to follow up promptly with primary care provider/cardiology    FINAL CLINICAL IMPRESSION(S) / ED DIAGNOSES   Final diagnoses:  Chest pain, unspecified type  Chronic atrial fibrillation (HCC)  Chronic combined systolic (congestive) and diastolic (congestive) heart failure (HCC)   Rx / DC Orders   ED Discharge Orders          Ordered    Ambulatory referral to  Cardiology       Comments: If you have not heard from the Cardiology office within the next 72 hours please call [phone].   07/01/24 0347           Note:  This document was prepared using Dragon voice recognition software and may include unintentional dictation errors.   Donica Derouin K, MD 07/01/24 [phone]

## 2024-07-02 NOTE — Progress Notes (Unsigned)
 Advanced Heart Failure Clinic Note    PCP: [name], MD (last seen 07/24) Cardiologist: Florencio Kava, MD (last seen 07/24)  HPI:  [name] is a 68 y/o male with a history of paroxysmal atrial fibrillation, coronary artery disease s/p CABG X2 with LIMA to the LAD and SVG to OM (2002); procedure was complicated by coronary artery dissection, asthma, osteoarthritis, hyperlipidemia, GERD, anxiety, DDD, diverticulosis, COPD, CKD essential hypertension and PSVT, type II diabetes mellitus, OSA & chronic heart failure. S/p PCI/DES to mid LAD with subsequent PTCA (2016), proximal to mid LAD stents (2014), distal LAD stent (unknown date), stent in the ostium SVG (unknown date) & circumflex/OM 1 bifucation stent (2013).   Has had 11 caths since 2014  Was in the ED 05/14/23 due to 20 episodes of diarrhea. Abdominal CT negative and negative for e coli. Antibiotics provided. Admitted 05/16/23 due to acute onset of midsternal and left-sided chest pain felt as tightness and graded 9/10 in severity with no radiation however with associated nausea as well as dyspnea and palpitations. Per Dr. Ammon, likely that one of the patient's bypass grafts has occluded with his recent NSTEMI (per his review of films, unlikely to be amenable to an intervention. IV diuresed. Has had 7 ED visits July 2024, 2 ED visits Aug 2024. Most recently was admitted 07/11/23 due to acute onset of midsternal chest pain, nausea and diaphoresis. He described the sensation as an elephant sitting on his chest. He also complained of cough productive of yellowish sputum and wheezing. Was recently treated for covid for which he was given molnupiravir  & completed 07/13/23. Chest pain, mildly elevated but flat troponins: He was seen by the cardiologist. Chest pain thought to be noncardiac in etiology. Cardiology consult done.    Echo 06/26/15 EF 60-65% along with mild LVH and trivial AR.  Echo 04/19/18: EF 50-55% Echo 03/08/20: EF 45-50%  along with mild MR Echo 11/02/22: EF 50-55% with mild LVH, moderate LAE Echo 05/01/23: EF 50-55% with mild LVH and mild/ moderate LAE  LHC 11/02/22:   Prox LAD to Mid LAD lesion is 30% stenosed.   Mid LAD lesion is 40% stenosed.   Prox RCA lesion is 15% stenosed.   Dist RCA lesion is 25% stenosed.   1st Mrg lesion is 70% stenosed.   RPAV-2 lesion is 60% stenosed.   RPAV-1 lesion is 85% stenosed with 85% stenosed side branch in 1st RPL.   RPDA-1 lesion is 90% stenosed.   RPDA-2 lesion is 85% stenosed.   Non-stenotic Mid LAD to Dist LAD lesion was previously treated.   Non-stenotic Prox Cx to Mid Cx lesion was previously treated.   Non-stenotic Origin to Prox Graft lesion was previously treated.   LIMA and is small.   There is mild left ventricular systolic dysfunction.   LV end diastolic pressure is mildly elevated.   The left ventricular ejection fraction is 45-50% by visual estimate.  Plan LHC from right  groin because of his persistent anginal symptoms.  Chronic angina Left ventriculogram shows mildly dilated LV with apical hypokinesis EF around 45-50 Coronaries Left main mild irregularities LAD mild to moderate disease.  Diagonal 1 Circumflex large minor irregularities occluded OM1 RCA large minor irregularities moderate disease TIMI-3 flow right dominant No significant collaterals  Grafts LIMA to mid LAD atretic SVG to OM1 widely patent  Patient cath essentially improved from previous procedure done 04/2021.  No areas of significant obstructive disease requiring intervention. Recommend aggressive medical therapy for chronic  chest pain appears to mostly be noncardiac  He presents today for a HF f/u visit with a chief complaint of moderate fatigue with minimal exertion. Chronic in nature although feels like it has worsened since recently being treated for covid. Has associated minimal SOB, dizziness, palpitations and stable chest pain along with this. Denies cough, abdominal  distention, pedal edema or difficulty sleeping. In fact, he feels like he sleeps all the time and last night slept from 9pm until noon today. Reports having decreased appetite but feels like he's hydrating well.   Unable to tolerate CPAP due to feeling like he was being choked.   ROS: All systems negative except as listed in HPI, PMH and Problem List.  SH:  Social History   Socioeconomic History   Marital status: Widowed    Spouse name: Not on file   Number of children: 1   Years of education: Not on file   Highest education level: 10th grade  Occupational History   Occupation: Disabled   Occupation: on Tree surgeon  Tobacco Use   Smoking status: Former    Current packs/day: 0.00    Average packs/day: 3.0 packs/day for 50.0 years (150.0 ttl pk-yrs)    Types: Cigarettes    Start date: 04/23/1963    Quit date: 04/22/2013    Years since quitting: 11.2   Smokeless tobacco: Never   Tobacco comments:    Reports not smoking for approx 8 years.  Vaping Use   Vaping status: Never Used  Substance and Sexual Activity   Alcohol use: No    Comment: remotely quit alcohol use. Hx of heavy alcohol use.   Drug use: Not Currently    Types: Marijuana   Sexual activity: Not Currently  Other Topics Concern   Not on file  Social History Narrative   Pt lives in [place] with wife.  Does not routinely exercise.   Social Drivers of Corporate investment banker Strain: Low Risk  (06/19/2024)   Received from [facility]   Overall Financial Resource Strain (CARDIA)    How hard is it for you to pay for the very basics like food, housing, medical care, and heating?: Not very hard  Food Insecurity: Food Insecurity Present (06/16/2024)   Hunger Vital Sign    Worried About Running Out of Food in the Last Year: Sometimes true    Ran Out of Food in the Last Year: Never true  Transportation Needs: No Transportation Needs (06/16/2024)   PRAPARE - Administrator, Civil Service (Medical):  No    Lack of Transportation (Non-Medical): No  Physical Activity: Insufficiently Active (03/14/2024)   Exercise Vital Sign    Days of Exercise per Week: 3 days    Minutes of Exercise per Session: 20 min  Stress: No Stress Concern Present (03/14/2024)   [employer] of Occupational Health - Occupational Stress Questionnaire    Feeling of Stress : Only a little  Recent Concern: Stress - Stress Concern Present (01/13/2024)   [employer] of Occupational Health - Occupational Stress Questionnaire    Feeling of Stress : To some extent  Social Connections: Socially Isolated (06/10/2024)   Social Connection and Isolation Panel    Frequency of Communication with Friends and Family: More than three times a week    Frequency of Social Gatherings with Friends and Family: Once a week    Attends Religious Services: Never    Database administrator or Organizations: No    Attends [employer]  or Organization Meetings: Never    Marital Status: Widowed  Intimate Partner Violence: Not At Risk (06/16/2024)   Humiliation, Afraid, Rape, and Kick questionnaire    Fear of Current or Ex-Partner: No    Emotionally Abused: No    Physically Abused: No    Sexually Abused: No    FH:  Family History  Problem Relation Age of Onset   Heart attack Mother    Depression Mother    Heart disease Mother    COPD Mother    Hypertension Mother    Heart attack Father    Diabetes Father    Depression Father    Heart disease Father    Cirrhosis Father    Parkinson's disease Brother     Past Medical History:  Diagnosis Date   A-fib (HCC)    Anemia    Anginal pain (HCC)    Anxiety    Arthritis    Asthma    CAD (coronary artery disease)    a. 2002 CABGx2 (LIMA->LAD, VG->VG->OM1);  b. 09/2012 DES->OM;  c. 03/2015 PTCA of LAD ([facility]) in setting of atretic LIMA; d. 05/2015 Cath [facility]): nonobs dzs; e. 06/2015 Cath ([facility]): LM nl, LAD 45p/d ISR, 50d, D1/2 small, LCX 50p/d ISR, OM1 70ost, 30 ISR, VG->OM1 50ost, 6m, LIMA->LAD  99p/d - atretic, RCA dom, nl; f.cath 10/16: 40-50%(FFR 0.90) pLAD, 75% (FFR 0.77) mLAD s/p PCI/DES, oRCA 40% (FFR0.95)   Cancer (HCC)    SKIN CANCER ON BACK   Celiac disease    Chronic diastolic CHF (congestive heart failure) (HCC)    a. 06/2009 Echo: EF 60-65%, Gr 1 DD, triv AI, mildly dil LA, nl RV.   COPD (chronic obstructive pulmonary disease) (HCC)    a. Chronic bronchitis and emphysema.   DDD (degenerative disc disease), lumbar    Diverticulosis    Dysrhythmia    Essential hypertension    GERD (gastroesophageal reflux disease)    History of hiatal hernia    History of kidney stones    H/O   History of tobacco abuse    a. Quit 2014.   Myocardial infarction [facility]) 2002   4 STENTS   Pancreatitis    PSVT (paroxysmal supraventricular tachycardia) (HCC)    a. 10/2012 Noted on [facility].   Sleep apnea    LOST CORD TO CPAP -ONLY 02 @ BEDTIME   Tubular adenoma of colon    Type II diabetes mellitus (HCC)    There were no vitals filed for this visit.  Wt Readings from Last 3 Encounters:  06/30/24 181 lb (82.1 kg)  06/29/24 180 lb 12.4 oz (82 kg)  06/29/24 179 lb (81.2 kg)   Lab Results  Component Value Date   CREATININE 1.21 06/30/2024   CREATININE 0.99 06/29/2024   CREATININE 1.28 (H) 06/23/2024    PHYSICAL EXAM:  General:  Well appearing. No resp difficulty HEENT: normal Neck: supple. JVP flat.  No lymphadenopathy or thryomegaly appreciated. Cor: PMI normal. Irregular rhythm, bradycardic. No rubs, gallops or murmurs. Lungs: clear Abdomen: soft, nontender, nondistended. No hepatosplenomegaly. No bruits or masses.  Extremities: no cyanosis, clubbing, rash, edema Neuro: alert & orientedx3, cranial nerves grossly intact. Moves all 4 extremities w/o difficulty. Affect pleasant.   ECG: not done   ASSESSMENT & PLAN:  1: Chronic ischemic heart failure with preserved ejection fraction- - CABG in addition to PCI/DES to mid LAD with subsequent PTCA (2016), proximal to mid  LAD stents (2014), distal LAD stent (unknown date), stent in the ostium SVG (  unknown date) & circumflex/OM 1 bifucation stent (2013).  - NYHA class III - euvolemic - weighing daily; reminded to call for an overnight weight gain of > 2 pounds or a weekly weight gain of >5 pounds - weight stable from last visit here 6 weeks ago - Echo 06/26/15 EF 60-65% along with mild LVH and trivial AR.  - Echo 04/19/18: EF 50-55% - Echo 03/08/20: EF 45-50% along with mild MR - Echo 11/02/22: EF 50-55% with mild LVH, moderate LAE - Echo 05/01/23: EF 50-55% with mild LVH and mild/ moderate LAE - continue lisinopril  20mg  daily; consider transition to entresto  - continue metoprolol  succinate 12.5mg  daily - continue torsemide  50mg  daily - jardiance  caused perineal pain - discussed the importance of rest and adequate hydration to aid in recovering from his recent covid infection - BNP 06/12/23 was 498.0  2: HTN- - BP 156/78 - saw PCP Jeralyn) 07/24 - BMP 07/15/23 showed sodium 137, potassium 4.7, creatinine 1.1 & GFR >60  3: CAD- - saw cardiology Philippe) 07/24; has upcoming f/u appt - continue atorvastatin  80mg  daily - continue plavix  75mg  daily - continue isosorbide  MN 120mg  daily - continue ranexa  1000mg  BID - LHC 11/02/22:   Prox LAD to Mid LAD lesion is 30% stenosed.   Mid LAD lesion is 40% stenosed.   Prox RCA lesion is 15% stenosed.   Dist RCA lesion is 25% stenosed.   1st Mrg lesion is 70% stenosed.   RPAV-2 lesion is 60% stenosed.   RPAV-1 lesion is 85% stenosed with 85% stenosed side branch in 1st RPL.   RPDA-1 lesion is 90% stenosed.   RPDA-2 lesion is 85% stenosed.   Non-stenotic Mid LAD to Dist LAD lesion was previously treated.   Non-stenotic Prox Cx to Mid Cx lesion was previously treated.   Non-stenotic Origin to Prox Graft lesion was previously treated.   LIMA and is small.   There is mild left ventricular systolic dysfunction.   LV end diastolic pressure is mildly elevated.   The  left ventricular ejection fraction is 45-50% by visual estimate.   4: Atrial fibrillation- - continue amiodarone  200mg  daily - continue eliquis  5mg  BID - irregular rhythm today  5: DM- - A1c 03/08/23 was 6.6%   Return in 3 months, sooner if needed.

## 2024-07-03 ENCOUNTER — Ambulatory Visit: Attending: Family | Admitting: Family

## 2024-07-03 ENCOUNTER — Encounter: Payer: Self-pay | Admitting: Family

## 2024-07-03 VITALS — BP 151/105 | HR 89 | Wt 182.2 lb

## 2024-07-03 DIAGNOSIS — I25118 Atherosclerotic heart disease of native coronary artery with other forms of angina pectoris: Secondary | ICD-10-CM | POA: Diagnosis not present

## 2024-07-03 DIAGNOSIS — K5730 Diverticulosis of large intestine without perforation or abscess without bleeding: Secondary | ICD-10-CM | POA: Insufficient documentation

## 2024-07-03 DIAGNOSIS — I480 Paroxysmal atrial fibrillation: Secondary | ICD-10-CM | POA: Insufficient documentation

## 2024-07-03 DIAGNOSIS — I130 Hypertensive heart and chronic kidney disease with heart failure and stage 1 through stage 4 chronic kidney disease, or unspecified chronic kidney disease: Secondary | ICD-10-CM | POA: Insufficient documentation

## 2024-07-03 DIAGNOSIS — Z955 Presence of coronary angioplasty implant and graft: Secondary | ICD-10-CM | POA: Diagnosis not present

## 2024-07-03 DIAGNOSIS — I2510 Atherosclerotic heart disease of native coronary artery without angina pectoris: Secondary | ICD-10-CM | POA: Insufficient documentation

## 2024-07-03 DIAGNOSIS — M1990 Unspecified osteoarthritis, unspecified site: Secondary | ICD-10-CM | POA: Insufficient documentation

## 2024-07-03 DIAGNOSIS — I10 Essential (primary) hypertension: Secondary | ICD-10-CM | POA: Diagnosis not present

## 2024-07-03 DIAGNOSIS — I5022 Chronic systolic (congestive) heart failure: Secondary | ICD-10-CM | POA: Diagnosis not present

## 2024-07-03 DIAGNOSIS — K219 Gastro-esophageal reflux disease without esophagitis: Secondary | ICD-10-CM | POA: Insufficient documentation

## 2024-07-03 DIAGNOSIS — J4489 Other specified chronic obstructive pulmonary disease: Secondary | ICD-10-CM | POA: Diagnosis not present

## 2024-07-03 DIAGNOSIS — F419 Anxiety disorder, unspecified: Secondary | ICD-10-CM | POA: Insufficient documentation

## 2024-07-03 DIAGNOSIS — Z7901 Long term (current) use of anticoagulants: Secondary | ICD-10-CM | POA: Insufficient documentation

## 2024-07-03 DIAGNOSIS — E782 Mixed hyperlipidemia: Secondary | ICD-10-CM

## 2024-07-03 DIAGNOSIS — Z79899 Other long term (current) drug therapy: Secondary | ICD-10-CM | POA: Insufficient documentation

## 2024-07-03 DIAGNOSIS — Z951 Presence of aortocoronary bypass graft: Secondary | ICD-10-CM | POA: Diagnosis not present

## 2024-07-03 DIAGNOSIS — N189 Chronic kidney disease, unspecified: Secondary | ICD-10-CM | POA: Diagnosis not present

## 2024-07-03 DIAGNOSIS — Z87891 Personal history of nicotine dependence: Secondary | ICD-10-CM | POA: Diagnosis not present

## 2024-07-03 DIAGNOSIS — E785 Hyperlipidemia, unspecified: Secondary | ICD-10-CM | POA: Diagnosis not present

## 2024-07-03 DIAGNOSIS — E1122 Type 2 diabetes mellitus with diabetic chronic kidney disease: Secondary | ICD-10-CM | POA: Insufficient documentation

## 2024-07-03 DIAGNOSIS — R0602 Shortness of breath: Secondary | ICD-10-CM | POA: Diagnosis present

## 2024-07-03 DIAGNOSIS — E1121 Type 2 diabetes mellitus with diabetic nephropathy: Secondary | ICD-10-CM

## 2024-07-03 NOTE — Patient Instructions (Signed)
 Medication Changes:  DECREASE Midodrine  to 1 tab daily until Thursday. STOP Midodrine  Thursday. Keep log of blood pressure. The goal is to have the top number between 110-130.  Lab Work:  Please have your lab work done today in [place] at the lower level.    Follow-Up in: Please follow up with the Advanced Heart Failure Clinic in 1 week with Ellouise Class, FNP.   Thank you for choosing [facility].    At the Advanced Heart Failure Clinic, you and your health needs are our priority. We have a designated team specialized in the treatment of Heart Failure. This Care Team includes your primary Heart Failure Specialized Cardiologist (physician), Advanced Practice Providers (APPs- Physician Assistants and Nurse Practitioners), and Pharmacist who all work together to provide you with the care you need, when you need it.   You may see any of the following providers on your designated Care Team at your next follow up:  Dr. Toribio Fuel Dr. Ezra Shuck Dr. Ria Commander Dr. Morene Brownie Ellouise Class, FNP Jaun Bash, RPH-CPP  Please be sure to bring in all your medications bottles to every appointment.   Need to Contact Us :  If you have any questions or concerns before your next appointment please send us  a message through [place] or call our office at [phone].    TO LEAVE A MESSAGE FOR THE NURSE SELECT OPTION 2, PLEASE LEAVE A MESSAGE INCLUDING: YOUR NAME DATE OF BIRTH CALL BACK NUMBER REASON FOR CALL**this is important as we prioritize the call backs  YOU WILL RECEIVE A CALL BACK THE SAME DAY AS LONG AS YOU CALL BEFORE 4:00 PM

## 2024-07-03 NOTE — Telephone Encounter (Signed)
 Requested medication (s) are due for refill today- no  Requested medication (s) are on the active medication list -yes  Future visit scheduled -yes  Last refill: 06/14/24-outside provider  Notes to clinic: See OV note cardiology 07/03/24- states continue but at decreased dosing- not changed in chart- sent for provider review   Requested Prescriptions  Pending Prescriptions Disp Refills   metoprolol  succinate (TOPROL -XL) 25 MG 24 hr tablet      Sig: Take 1 tablet (25 mg total) by mouth daily.     Cardiovascular:  Beta Blockers Failed - 07/03/2024  3:03 PM      Failed - Last BP in normal range    BP Readings from Last 1 Encounters:  07/03/24 (!) 151/105         Passed - Last Heart Rate in normal range    Pulse Readings from Last 1 Encounters:  07/03/24 89         Passed - Valid encounter within last 6 months    Recent Outpatient Visits           1 week ago Bilious vomiting with nausea   [facility] [name], MD   3 weeks ago Pneumonia of right lower lobe due to infectious organism   [facility] [name], MD   1 month ago Pneumonia due to infectious organism, unspecified laterality, unspecified part of lung   [facility] [name], MD   3 months ago OSA (obstructive sleep apnea)   [facility] [name], MD   3 months ago Depression, major, single episode, in partial remission [facility])   [facility] [name], MD                 Requested Prescriptions  Pending Prescriptions Disp Refills   metoprolol  succinate (TOPROL -XL) 25 MG 24 hr tablet      Sig: Take 1 tablet (25 mg total) by mouth daily.     Cardiovascular:  Beta Blockers Failed - 07/03/2024  3:03 PM      Failed - Last BP in normal range    BP Readings from Last 1 Encounters:  07/03/24 (!) 151/105         Passed - Last Heart Rate in normal  range    Pulse Readings from Last 1 Encounters:  07/03/24 89         Passed - Valid encounter within last 6 months    Recent Outpatient Visits           1 week ago Bilious vomiting with nausea   [facility] [name], MD   3 weeks ago Pneumonia of right lower lobe due to infectious organism   [facility] [name], MD   1 month ago Pneumonia due to infectious organism, unspecified laterality, unspecified part of lung   [facility] [name], MD   3 months ago OSA (obstructive sleep apnea)   [facility] [name], MD   3 months ago Depression, major, single episode, in partial remission [facility])   [facility] Gasper, Nancyann BRAVO, MD

## 2024-07-04 ENCOUNTER — Encounter (INDEPENDENT_AMBULATORY_CARE_PROVIDER_SITE_OTHER): Payer: Medicare HMO

## 2024-07-04 ENCOUNTER — Ambulatory Visit: Payer: Self-pay | Admitting: Family

## 2024-07-04 ENCOUNTER — Ambulatory Visit (INDEPENDENT_AMBULATORY_CARE_PROVIDER_SITE_OTHER): Payer: Medicare HMO | Admitting: Nurse Practitioner

## 2024-07-04 LAB — BASIC METABOLIC PANEL WITH GFR
BUN/Creatinine Ratio: 12 (ref 10–24)
BUN: 12 mg/dL (ref 8–27)
CO2: 26 mmol/L (ref 20–29)
Calcium: 9.9 mg/dL (ref 8.6–10.2)
Chloride: 103 mmol/L (ref 96–106)
Creatinine, Ser: 1.02 mg/dL (ref 0.76–1.27)
Glucose: 97 mg/dL (ref 70–99)
Potassium: 5.2 mmol/L (ref 3.5–5.2)
Sodium: 142 mmol/L (ref 134–144)
eGFR: 79 mL/min/1.73 (ref >59)

## 2024-07-04 NOTE — Telephone Encounter (Signed)
-----   Message from Ellouise DELENA Class sent at 07/04/2024  8:24 AM EDT ----- Kidney function looks great. Potassium is normal but is now on the high side. Make sure he's not taking spironolactone , entresto  or using NoSalt. Confirm that he's not taking any potassium  supplements.  ----- Message ----- From: Interface, [facility] Lab Results In Sent: 07/04/2024  12:35 AM EDT To: Ellouise DELENA Class, FNP

## 2024-07-04 NOTE — Telephone Encounter (Signed)
 Spoke to pt about medications. Pt is taking entresto  but not spiro. Per Ellouise Class, FNP, stop entresto  and get blood work at follow up appt. Pt also stated that he is not on any potassium supplements or ms dash.

## 2024-07-05 ENCOUNTER — Telehealth: Payer: Self-pay

## 2024-07-06 ENCOUNTER — Telehealth: Payer: Self-pay

## 2024-07-06 NOTE — Progress Notes (Signed)
 Complex Care Management Note  Care Guide Note 07/06/2024 Name: Lawrence [name] MRN: [id number] DOB: 1956/04/11  Lawrence [name] is a 68 y.o. year old male who sees [name], Nancyann BRAVO, MD for primary care. I reached out to Lawrence [name] by phone today to offer complex care management services.  Lawrence [name] was given information about Complex Care Management services today including:   The Complex Care Management services include support from the care team which includes your Nurse Care Manager, Clinical Social Worker, or Pharmacist.  The Complex Care Management team is here to help remove barriers to the health concerns and goals most important to you. Complex Care Management services are voluntary, and the patient may decline or stop services at any time by request to their care team member.   Complex Care Management Consent Status: Patient agreed to services and verbal consent obtained.   Follow up plan:  Telephone appointment with complex care management team member scheduled for:  07/13/24 at 2:30 p.m.  Encounter Outcome:  Patient Scheduled  [facility], [facility] VBCI Assistant Direct Dial: [phone]  Fax: [phone]

## 2024-07-07 ENCOUNTER — Other Ambulatory Visit: Payer: Self-pay

## 2024-07-07 ENCOUNTER — Emergency Department
Admission: EM | Admit: 2024-07-07 | Discharge: 2024-07-08 | Disposition: A | Discharge status: Home / Self Care | Attending: Emergency Medicine | Admitting: Emergency Medicine

## 2024-07-07 ENCOUNTER — Encounter: Payer: Self-pay | Admitting: Emergency Medicine

## 2024-07-07 DIAGNOSIS — I160 Hypertensive urgency: Secondary | ICD-10-CM | POA: Diagnosis not present

## 2024-07-07 DIAGNOSIS — R079 Chest pain, unspecified: Secondary | ICD-10-CM

## 2024-07-07 DIAGNOSIS — R519 Headache, unspecified: Secondary | ICD-10-CM | POA: Diagnosis present

## 2024-07-07 DIAGNOSIS — I10 Essential (primary) hypertension: Secondary | ICD-10-CM | POA: Insufficient documentation

## 2024-07-07 DIAGNOSIS — J449 Chronic obstructive pulmonary disease, unspecified: Secondary | ICD-10-CM | POA: Diagnosis not present

## 2024-07-07 DIAGNOSIS — I2510 Atherosclerotic heart disease of native coronary artery without angina pectoris: Secondary | ICD-10-CM | POA: Diagnosis not present

## 2024-07-07 LAB — BASIC METABOLIC PANEL WITH GFR
Anion gap: 10 (ref 5–15)
BUN: 19 mg/dL (ref 8–23)
CO2: 26 mmol/L (ref 22–32)
Calcium: 9.6 mg/dL (ref 8.9–10.3)
Chloride: 102 mmol/L (ref 98–111)
Creatinine, Ser: 0.93 mg/dL (ref 0.61–1.24)
GFR, Estimated: >60 mL/min (ref >60)
Glucose, Bld: 90 mg/dL (ref 70–99)
Potassium: 3.6 mmol/L (ref 3.5–5.1)
Sodium: 138 mmol/L (ref 135–145)

## 2024-07-07 LAB — CBC
HCT: 47.8 % (ref 39.0–52.0)
Hemoglobin: 15.6 g/dL (ref 13.0–17.0)
MCH: 29.9 pg (ref 26.0–34.0)
MCHC: 32.6 g/dL (ref 30.0–36.0)
MCV: 91.7 fL (ref 80.0–100.0)
Platelets: 188 K/uL (ref 150–400)
RBC: 5.21 MIL/uL (ref 4.22–5.81)
RDW: 15.9 % — ABNORMAL HIGH (ref 11.5–15.5)
WBC: 7.9 K/uL (ref 4.0–10.5)
nRBC: 0 % (ref 0.0–0.2)

## 2024-07-07 NOTE — ED Triage Notes (Addendum)
 Pt in via [facility] from home, complaining of headache tonight, checked BP at home; 186/110.  Reports being compliant with meds; seen for same recently.  NAD noted at this time.

## 2024-07-08 ENCOUNTER — Emergency Department

## 2024-07-08 ENCOUNTER — Other Ambulatory Visit: Payer: Self-pay

## 2024-07-08 DIAGNOSIS — I160 Hypertensive urgency: Secondary | ICD-10-CM | POA: Diagnosis not present

## 2024-07-08 LAB — TROPONIN I (HIGH SENSITIVITY)
Troponin I (High Sensitivity): 20 ng/L — ABNORMAL HIGH (ref <18)
Troponin I (High Sensitivity): 20 ng/L — ABNORMAL HIGH (ref <18)

## 2024-07-08 MED ORDER — OXYCODONE-ACETAMINOPHEN 5-325 MG PO TABS
1.0000 | ORAL_TABLET | Freq: Once | ORAL | Status: AC
  Administered 2024-07-08: 1 via ORAL
  Filled 2024-07-08: qty 1

## 2024-07-08 MED ORDER — KETOROLAC TROMETHAMINE 30 MG/ML IJ SOLN
15.0000 mg | Freq: Once | INTRAMUSCULAR | Status: AC
  Administered 2024-07-08: 15 mg via INTRAVENOUS
  Filled 2024-07-08: qty 1

## 2024-07-08 MED ORDER — ONDANSETRON HCL 4 MG/2ML IJ SOLN
4.0000 mg | Freq: Once | INTRAMUSCULAR | Status: AC
  Administered 2024-07-08: 4 mg via INTRAVENOUS
  Filled 2024-07-08: qty 2

## 2024-07-08 MED ORDER — IPRATROPIUM-ALBUTEROL 0.5-2.5 (3) MG/3ML IN SOLN
3.0000 mL | Freq: Once | RESPIRATORY_TRACT | Status: AC
  Administered 2024-07-08: 3 mL via RESPIRATORY_TRACT
  Filled 2024-07-08: qty 3

## 2024-07-08 NOTE — ED Notes (Signed)
 Pt given Dc instructions. Pt verbalized understanding of follow up care. Pt taken from ED in wheelchair by this RN. NAD noted at departure.

## 2024-07-08 NOTE — ED Provider Notes (Signed)
 [facility] Provider Note    Event Date/Time   First MD Initiated Contact with Patient 07/07/24 2322     (approximate)   History   Headache and Hypertension   HPI  Lawrence [name] is a 68 year old male with history of A-fib, CAD, COPD presenting to the emergency department for evaluation of high blood pressure.  Patient reports that he has been following up with his heart failure team, but noticed that he had persistent high blood pressure today.  Also reports that this evening he had onset of a headache, generalized, not sudden in onset, but reports this is atypical for him.  No new numbness, tingling or focal weakness.  Additionally reports intermittent chest pain, says this is similar to recent episodes of chest pain that he has been evaluated for.  Reviewed recent discharge summary from [facility] system from 06/17/2024.  At that time, patient presented with chest pain and lightheadedness with associated bradycardia.  Metoprolol  was held, started on Ranexa , repeat ischemic workup was deferred as this had been recently performed.       Physical Exam   Triage Vital Signs: ED Triage Vitals  Encounter Vitals Group     BP 07/07/24 2224 (!) 186/105     Girls Systolic BP Percentile --      Girls Diastolic BP Percentile --      Boys Systolic BP Percentile --      Boys Diastolic BP Percentile --      Pulse Rate 07/07/24 2224 62     Resp 07/07/24 2224 16     Temp 07/07/24 2224 97.8 F (36.6 C)     Temp Source 07/07/24 2224 Oral     SpO2 07/07/24 2224 96 %     Weight 07/07/24 2226 181 lb (82.1 kg)     Height 07/07/24 2226 5' 7 (1.702 m)     Head Circumference --      Peak Flow --      Pain Score 07/07/24 2225 9     Pain Loc --      Pain Education --      Exclude from Growth Chart --     Most recent vital signs: Vitals:   07/08/24 0000 07/08/24 0156  BP: (!) 172/117 (!) 156/105  Pulse: 90 92  Resp:  11  Temp:  97.9 F (36.6 C)  SpO2: 96% 95%      General: Awake, interactive . Head:  No temporal tenderness, atraumatic CV:  Regular rate, good peripheral perfusion.  Resp:  Unlabored respirations, occasional expiratory wheezing, lungs otherwise clear Abd:  Nondistended, soft, nontender Neuro:  Alert and oriented, normal extraocular movements, symmetric facial movement, sensation intact over bilateral upper and lower extremities with 5 out of 5 strength.  Normal finger-to-nose testing.  ED Results / Procedures / Treatments   Labs (all labs ordered are listed, but only abnormal results are displayed) Labs Reviewed  CBC - Abnormal; Notable for the following components:      Result Value   RDW 15.9 (*)    All other components within normal limits  TROPONIN I (HIGH SENSITIVITY) - Abnormal; Notable for the following components:   Troponin I (High Sensitivity) 20 (*)    All other components within normal limits  TROPONIN I (HIGH SENSITIVITY) - Abnormal; Notable for the following components:   Troponin I (High Sensitivity) 20 (*)    All other components within normal limits  BASIC METABOLIC PANEL WITH GFR     EKG EKG  independently reviewed and interpreted by myself demonstrates:  EKG demonstrates A-fib at a rate of 91, QRS 91, QTc 463, no acute ST changes  RADIOLOGY Imaging independently reviewed and interpreted by myself demonstrates:  CXR without focal consolidation CT head without acute bleed  Formal Radiology Read:  CT Head Wo Contrast Result Date: 07/08/2024 CLINICAL DATA:  Headache, new onset (Age >= 51y) Pt complaining of headache tonight, checked BP at home; 186/110. Reports being compliant with meds; seen for same recently. EXAM: CT HEAD WITHOUT CONTRAST TECHNIQUE: Contiguous axial images were obtained from the base of the skull through the vertex without intravenous contrast. RADIATION DOSE REDUCTION: This exam was performed according to the departmental dose-optimization program which includes automated exposure  control, adjustment of the mA and/or kV according to patient size and/or use of iterative reconstruction technique. COMPARISON:  MRI head 06/14/2024, CT head 06/09/2024 FINDINGS: Brain: Trace patchy and confluent areas of decreased attenuation are noted throughout the deep and periventricular white matter of the cerebral hemispheres bilaterally, compatible with chronic microvascular ischemic disease. No evidence of large-territorial acute infarction. No parenchymal hemorrhage. No mass lesion. No extra-axial collection. No mass effect or midline shift. No hydrocephalus. Basilar cisterns are patent. Vascular: No hyperdense vessel. Atherosclerotic calcifications are present within the cavernous internal carotid and vertebral arteries. Skull: No acute fracture or focal lesion. Sinuses/Orbits: Paranasal sinuses and mastoid air cells are clear. The orbits are unremarkable. Other: None. IMPRESSION: No acute intracranial abnormality. Electronically Signed   By: Morgane  Naveau M.D.   On: 07/08/2024 01:02   DG Chest Portable 1 View Result Date: 07/08/2024 CLINICAL DATA:  chest pain EXAM: PORTABLE CHEST 1 VIEW COMPARISON:  Chest x-Laquasia Pincus 06/30/2024, CT chest 06/10/2024 FINDINGS: The heart and mediastinal contours are unchanged. Atherosclerotic plaque. No focal consolidation. Chronic coarsened interstitial markings with no overt pulmonary edema. No pleural effusion. No pneumothorax. No acute osseous abnormality.  Intact sternotomy wires. IMPRESSION: 1. No active disease. 2.  Aortic Atherosclerosis (ICD10-I70.0). Electronically Signed   By: Morgane  Naveau M.D.   On: 07/08/2024 00:34    PROCEDURES:  Critical Care performed: No  Procedures   MEDICATIONS ORDERED IN ED: Medications  oxyCODONE -acetaminophen  (PERCOCET/ROXICET) 5-325 MG per tablet 1 tablet (has no administration in time range)  ipratropium-albuterol  (DUONEB) 0.5-2.5 (3) MG/3ML nebulizer solution 3 mL (3 mLs Nebulization Given 07/08/24 0014)  ondansetron   (ZOFRAN ) injection 4 mg (4 mg Intravenous Given 07/08/24 0223)  ketorolac  (TORADOL ) 30 MG/ML injection 15 mg (15 mg Intravenous Given 07/08/24 0224)     IMPRESSION / MDM / ASSESSMENT AND PLAN / ED COURSE  I reviewed the triage vital signs and the nursing notes.  Differential diagnosis includes, but is not limited to, hypertensive emergency including ACS, pulmonary edema, acute intracranial bleed, anemia, no fever or focal neurodeficit suggestive of meningitis, CVA, no temporal tenderness suggestive of temporal arteritis, very low suspicion aortic dissection given similar chest pain multiple times in the past without change in character today  Patient's presentation is most consistent with acute presentation with potential threat to life or bodily function.  68 year old male presenting with elevated blood pressure with associated headache and chest pain.  Remains hypertensive on presentation here.  Physical exam overall reassuring.  Has had multiple recent chest pain workups, will plan for EKG,troponin x2.  Does report headache is new, will obtain head CT to further evaluate.  CT head without acute bleed.  EKG and troponin x 2 minimally elevated, stable compared to priors.  Patient reassessed.  Reports improved headache.  Does report some ongoing pain, reports he takes Percocet at home for this.  I am able to see this in his PDMP database, given a dose of this here.  Discussed discharge given reassuring workup here.  Patient is comfortable with this plan.  Strict return precautions provided.  Patient discharged in stable condition with plans for outpatient follow-up with his primary care doctor and cardiologist.      FINAL CLINICAL IMPRESSION(S) / ED DIAGNOSES   Final diagnoses:  Acute nonintractable headache, unspecified headache type  Hypertensive urgency  Nonspecific chest pain     Rx / DC Orders   ED Discharge Orders     None        Note:  This document was prepared using  Dragon voice recognition software and may include unintentional dictation errors.   Levander Slate, MD 07/08/24 [phone]

## 2024-07-08 NOTE — ED Notes (Signed)
 Lab called to add on troponin

## 2024-07-08 NOTE — Discharge Instructions (Addendum)
 You are seen in the emergency room today for evaluation of your elevated blood pressure.  Your testing fortunately was overall reassuring.  Please continue to follow-up with your doctors for further evaluation of your symptoms including your blood pressure.  Return to the ER for new or worsening symptoms.

## 2024-07-09 NOTE — Progress Notes (Deleted)
 Advanced Heart Failure Clinic Note    PCP: [name], MD  Cardiologist: [facility] cardiology (last seen 06/25)  Chief Complaint: shortness of breath   HPI:  Mr Skog is a 68 y/o male with a history of paroxysmal atrial fibrillation, coronary artery disease s/p CABG X2 with LIMA to the LAD and SVG to OM (2002); procedure was complicated by coronary artery dissection, asthma, osteoarthritis, hyperlipidemia, GERD, anxiety, DDD, diverticulosis, COPD, CKD essential hypertension and PSVT, type II diabetes mellitus, OSA & chronic heart failure. S/p PCI/DES to mid LAD with subsequent PTCA (2016), proximal to mid LAD stents (2014), distal LAD stent (unknown date), stent in the ostium SVG (unknown date) & circumflex/OM 1 bifucation stent (2013).   Has had 11 caths since 2014  Echo 05/01/23: EF 50-55% with mild LVH and mild/ moderate LAE  Was in the ED 05/14/23 due to 20 episodes of diarrhea. Abdominal CT negative and negative for e coli. Antibiotics provided.   Admitted 05/16/23 due to acute onset of midsternal and left-sided chest pain felt as tightness and graded 9/10 in severity with no radiation however with associated nausea as well as dyspnea and palpitations. Per Dr. Ammon, likely that one of the patient's bypass grafts has occluded with his recent NSTEMI (per his review of films, unlikely to be amenable to an intervention. IV diuresed. Has had 7 ED visits July 2024, 2 ED visits Aug 2024.   Echo 06/10/24: EF 45-50%, mild LVH, G1DD  Admitted 07/11/23 due to acute onset of midsternal chest pain, nausea and diaphoresis. He described the sensation as an elephant sitting on his chest. He also complained of cough productive of yellowish sputum and wheezing. Was recently treated for covid for which he was given molnupiravir  & completed 07/13/23. Chest pain, mildly elevated but flat troponins: He was seen by the cardiologist. Chest pain thought to be noncardiac in etiology. Cardiology consult done.     Since last seen in [facility] 09/24, he has been admitted 12 times and been in the ED 48 times. Majority of times has been for chest pain.   He presents today for a HF f/u visit with a chief complaint of shortness of breath. Has associated fatigue, right ankle edema, chronic chest pain, palpitations, dizziness. Unable to tolerate CPAP due to feeling like he was being choked. Checks his BP twice daily at home and says that the top number stays >140. Continues to take midodrine  TID and entresto / spironolactone  are still paused. He is asking whether he would qualify for a pacemaker or defibrillator for his afib.   Previous cardiac studies:  Echo 06/26/15 EF 60-65% along with mild LVH and trivial AR.  Echo 04/19/18: EF 50-55% Echo 03/08/20: EF 45-50% along with mild MR Echo 11/02/22: EF 50-55% with mild LVH, moderate LAE  LHC 11/02/22:   Prox LAD to Mid LAD lesion is 30% stenosed.   Mid LAD lesion is 40% stenosed.   Prox RCA lesion is 15% stenosed.   Dist RCA lesion is 25% stenosed.   1st Mrg lesion is 70% stenosed.   RPAV-2 lesion is 60% stenosed.   RPAV-1 lesion is 85% stenosed with 85% stenosed side branch in 1st RPL.   RPDA-1 lesion is 90% stenosed.   RPDA-2 lesion is 85% stenosed.   Non-stenotic Mid LAD to Dist LAD lesion was previously treated.   Non-stenotic Prox Cx to Mid Cx lesion was previously treated.   Non-stenotic Origin to Prox Graft lesion was previously treated.   LIMA and  is small.   There is mild left ventricular systolic dysfunction.   LV end diastolic pressure is mildly elevated.   The left ventricular ejection fraction is 45-50% by visual estimate.  Plan LHC from right  groin because of his persistent anginal symptoms.  Chronic angina Left ventriculogram shows mildly dilated LV with apical hypokinesis EF around 45-50 Coronaries Left main mild irregularities LAD mild to moderate disease.  Diagonal 1 Circumflex large minor irregularities occluded OM1 RCA large minor  irregularities moderate disease TIMI-3 flow right dominant No significant collaterals  Grafts LIMA to mid LAD atretic SVG to OM1 widely patent  Patient cath essentially improved from previous procedure done 04/2021.  No areas of significant obstructive disease requiring intervention. Recommend aggressive medical therapy for chronic chest pain appears to mostly be noncardiac   ROS: All systems negative except as listed in HPI, PMH and Problem List.  SH:  Social History   Socioeconomic History   Marital status: Widowed    Spouse name: Not on file   Number of children: 1   Years of education: Not on file   Highest education level: 10th grade  Occupational History   Occupation: Disabled   Occupation: on Tree surgeon  Tobacco Use   Smoking status: Former    Current packs/day: 0.00    Average packs/day: 3.0 packs/day for 50.0 years (150.0 ttl pk-yrs)    Types: Cigarettes    Start date: 04/23/1963    Quit date: 04/22/2013    Years since quitting: 11.2   Smokeless tobacco: Never   Tobacco comments:    Reports not smoking for approx 8 years.  Vaping Use   Vaping status: Never Used  Substance and Sexual Activity   Alcohol use: No    Comment: Hx Alcohol Abuse   Drug use: Not Currently    Types: Marijuana   Sexual activity: Not Currently  Other Topics Concern   Not on file  Social History Narrative   Pt lives in [place] with wife.  Does not routinely exercise.   Social Drivers of Corporate investment banker Strain: Low Risk  (06/19/2024)   Received from [facility]   Overall Financial Resource Strain (CARDIA)    How hard is it for you to pay for the very basics like food, housing, medical care, and heating?: Not very hard  Food Insecurity: Food Insecurity Present (06/16/2024)   Hunger Vital Sign    Worried About Running Out of Food in the Last Year: Sometimes true    Ran Out of Food in the Last Year: Never true  Transportation Needs: No Transportation Needs (06/16/2024)    PRAPARE - Administrator, Civil Service (Medical): No    Lack of Transportation (Non-Medical): No  Physical Activity: Insufficiently Active (03/14/2024)   Exercise Vital Sign    Days of Exercise per Week: 3 days    Minutes of Exercise per Session: 20 min  Stress: No Stress Concern Present (03/14/2024)   [employer] of Occupational Health - Occupational Stress Questionnaire    Feeling of Stress : Only a little  Recent Concern: Stress - Stress Concern Present (01/13/2024)   [employer] of Occupational Health - Occupational Stress Questionnaire    Feeling of Stress : To some extent  Social Connections: Socially Isolated (06/10/2024)   Social Connection and Isolation Panel    Frequency of Communication with Friends and Family: More than three times a week    Frequency of Social Gatherings with Friends and Family: Once  a week    Attends Religious Services: Never    Active Member of Clubs or Organizations: No    Attends Banker Meetings: Never    Marital Status: Widowed  Intimate Partner Violence: Not At Risk (06/16/2024)   Humiliation, Afraid, Rape, and Kick questionnaire    Fear of Current or Ex-Partner: No    Emotionally Abused: No    Physically Abused: No    Sexually Abused: No    FH:  Family History  Problem Relation Age of Onset   Heart attack Mother    Depression Mother    Heart disease Mother    COPD Mother    Hypertension Mother    Heart attack Father    Diabetes Father    Depression Father    Heart disease Father    Cirrhosis Father    Parkinson's disease Brother     Past Medical History:  Diagnosis Date   A-fib (HCC)    Anemia    Anginal pain (HCC)    Anxiety    Arthritis    Asthma    CAD (coronary artery disease)    a. 2002 CABGx2 (LIMA->LAD, VG->VG->OM1);  b. 09/2012 DES->OM;  c. 03/2015 PTCA of LAD ([facility]) in setting of atretic LIMA; d. 05/2015 Cath [facility]): nonobs dzs; e. 06/2015 Cath ([facility]): LM nl, LAD 45p/d ISR, 50d, D1/2 small,  LCX 50p/d ISR, OM1 70ost, 30 ISR, VG->OM1 50ost, 64m, LIMA->LAD 99p/d - atretic, RCA dom, nl; f.cath 10/16: 40-50%(FFR 0.90) pLAD, 75% (FFR 0.77) mLAD s/p PCI/DES, oRCA 40% (FFR0.95)   Cancer (HCC)    SKIN CANCER ON BACK   Celiac disease    Chronic diastolic CHF (congestive heart failure) (HCC)    a. 06/2009 Echo: EF 60-65%, Gr 1 DD, triv AI, mildly dil LA, nl RV.   COPD (chronic obstructive pulmonary disease) (HCC)    a. Chronic bronchitis and emphysema.   DDD (degenerative disc disease), lumbar    Diverticulosis    Dysrhythmia    Essential hypertension    GERD (gastroesophageal reflux disease)    History of hiatal hernia    History of kidney stones    H/O   History of tobacco abuse    a. Quit 2014.   Myocardial infarction [facility]) 2002   4 STENTS   Pancreatitis    PSVT (paroxysmal supraventricular tachycardia) (HCC)    a. 10/2012 Noted on Zio Patch.   Sleep apnea    LOST CORD TO CPAP -ONLY 02 @ BEDTIME   Tubular adenoma of colon    Type II diabetes mellitus (HCC)    There were no vitals filed for this visit.  Wt Readings from Last 3 Encounters:  07/07/24 181 lb (82.1 kg)  07/03/24 182 lb 3.2 oz (82.6 kg)  06/30/24 181 lb (82.1 kg)   Lab Results  Component Value Date   CREATININE 0.93 07/07/2024   CREATININE 1.02 07/03/2024   CREATININE 1.21 06/30/2024    PHYSICAL EXAM:  General: Disheveled appearing.  Cor: No JVD. Irregular rhythm, normal rate.  Lungs: sporadic expiratory wheezing Abdomen: soft, nontender, nondistended. Extremities: trace edema around right ankle Neuro:. Affect pleasant   ECG: not done   ASSESSMENT & PLAN:  1: Chronic ischemic heart failure with mildly reduced ejection fraction- - CABG in addition to PCI/DES to mid LAD with subsequent PTCA (2016), proximal to mid LAD stents (2014), distal LAD stent (unknown date), stent in the ostium SVG (unknown date) & circumflex/OM 1 bifucation stent (2013).  - NYHA class III -  euvolemic - weight down 12  pounds from last visit here 11 months ago - Echo 06/26/15 EF 60-65% along with mild LVH and trivial AR.  - Echo 04/19/18: EF 50-55% - Echo 03/08/20: EF 45-50% along with mild MR - Echo 11/02/22: EF 50-55% with mild LVH, moderate LAE - Echo 05/01/23: EF 50-55% with mild LVH and mild/ moderate LAE - Echo 06/10/24: EF 45-50%, mild LVH, G1DD - continue farxiga  10mg  daily - continue furosemide  20mg  daily - continue metoprolol  succinate 25mg  daily - entresto  and spironolactone  remain on hold as he's had hypotension in the past. Consider resuming low dose spiro after getting lab results back - jardiance  caused perineal pain - BNP 06/30/24 was 297.6  2: HTN- - BP 151/105; decrease midodrine  to 2.5mg  daily for 3 days & then stop as he's getting SBP @ home >140's - BP log given and instructed to continue checking BP at home and bring log to each visit - saw PCP Jeralyn) 08/25 - BMP 06/30/24 reviewed: sodium 141, potassium 3.3, creatinine 1.21 & GFR >60 - BMET today. Could consider resuming low dose spiro after getting labs back  3: CAD- - saw [facility] cardiology 06/25 - continue isosorbide  MN 120mg  daily - continue plavix  75mg  daily - continue ranexa  1000mg  BID - LHC 11/02/22:   Prox LAD to Mid LAD lesion is 30% stenosed.   Mid LAD lesion is 40% stenosed.   Prox RCA lesion is 15% stenosed.   Dist RCA lesion is 25% stenosed.   1st Mrg lesion is 70% stenosed.   RPAV-2 lesion is 60% stenosed.   RPAV-1 lesion is 85% stenosed with 85% stenosed side branch in 1st RPL.   RPDA-1 lesion is 90% stenosed.   RPDA-2 lesion is 85% stenosed.   Non-stenotic Mid LAD to Dist LAD lesion was previously treated.   Non-stenotic Prox Cx to Mid Cx lesion was previously treated.   Non-stenotic Origin to Prox Graft lesion was previously treated.   LIMA and is small.   There is mild left ventricular systolic dysfunction.   LV end diastolic pressure is mildly elevated.   The left ventricular ejection fraction is 45-50% by  visual estimate.   4: Atrial fibrillation- - continue eliquis  5mg  BID - continue amiodarone  200mg  daily - irregular rhythm. EF too high for AICD & HR has been in 80's-90's. He will f/u with [facility] cardiology to discuss further about possible pacemaker  5: DM- - A1c 02/01/24 was 5.8% - continue metformin   6: Hyperlipidemia- - LDL 10/19/23 was 80 with triglycerides of 156 - continue atorvastatin  80mg  daily   Return in 1 week, sooner if needed.   Ellouise Class, FNP-C 07/03/24

## 2024-07-10 ENCOUNTER — Encounter: Admitting: Family

## 2024-07-10 ENCOUNTER — Other Ambulatory Visit: Payer: Self-pay | Admitting: Family Medicine

## 2024-07-12 ENCOUNTER — Telehealth: Payer: Self-pay | Admitting: Family

## 2024-07-12 NOTE — Telephone Encounter (Signed)
 Called to confirm/remind patient of their appointment at the Advanced [facility] Failure Clinic on 07/13/24.   Appointment:   [x] Confirmed  [] Left mess   [] No answer/No voice mail  [] VM Full/unable to leave message  [] Phone not in service  Patient reminded to bring all medications and/or complete list.  Confirmed patient has transportation. Gave directions, instructed to utilize valet parking.

## 2024-07-12 NOTE — ED Provider Notes (Signed)
 [facility] Emergency Department Provider Note   ED Clinical Impression   Final diagnoses:  Chest pain, unspecified type (Primary)    Impression, Medical Decision Making, ED Course   Impression: 68 y.o. male with PMH most significant for HTN, HLD, T2DM, CAD (s/p CABG x2), HFmrEF, A-Fib (on Eliquis ), COPD, chronic bronchitis, osteoarthritis, asthma, DDD, diverticulosis, and GERD who presents with 1 day of chest pressure and hypertension as described below. On exam, patient is chronically ill appearing, in no acute distress. VS here with hypertensive to 192/125 on arrival, afebrile, HR 111, RR 18, 97% on room air. Heart irregularly irregular, no murmurs. Lungs with mild end expiratory wheezing, good air movement, normal work of breathing. Abdomen soft and nontender. No LE edema. Differential diagnosis includes ACS vs electrolyte abnormality vs pneumonia vs pleural effusion vs hypertensive urgency vs arhythmia, among others. Patient has had many ED visits and admissions for evaluation of this chest pain. Low suspicion for aortic pathology given description of pain which feels similar to his typical pain, and he has had several CTAs in the past which were negative for aortic pathology. Regarding his blood pressure, I suspect this is in the setting of many of his medications being held 2 weeks ago after admission for orthostatic hypotension; patient has not taking his entresto  (sacubitril -valsartan ), imdur , metoprolol , midodrine , and spironolactone . He follows up with his Heart Failure doctor tomorrow to discuss medication changes. Will plan for CXR, EKG,labs including serial troponins. Will give IV morphine  for pain as patient reports that is what works best when he has this type of pain.  Orders Placed This Encounter  Procedures  . XR Chest 2 views  . CBC w/ Differential  . Comprehensive Metabolic Panel  . PT-INR  . hsTroponin I (serial 0-2-6H w/ delta)  . hsTroponin I - 2 Hour  . hsTroponin I - 6  Hour  . ECG 12 Lead  . ECG 12 Lead  . ECG 12 Lead    ED Course as of 07/13/24 1051  Wed Jul 12, 2024  1645 CBC w/ Differential(!):   WBC 6.6  RBC 5.61(!)  HGB 17.0(!)  HCT 51.3(!)  MCV 91.3  MCH 30.3  MCHC 33.2  RDW 17.2(!)  MPV 8.0  Platelet 165  nRBC 0  Neutrophils % 69.3  Lymphocytes % 21.2  Monocytes % 7.2  Eosinophils % 1.3  Basophils % 1.0  Absolute Neutrophils 4.6  Absolute Lymphocytes 1.4  Absolute Monocytes  0.5  Absolute Eosinophils 0.1  Absolute Basophils  0.1  Anisocytosis Slight(!) CBC without leukocytosis with WBC 6.6, hemoglobin 17.0. Platelets 165.  1645 XR Chest 2 views IMPRESSION:   Mildly increased interstitial markings can be seen with mild interstitial pulmonary edema or atypical infection, depending on the clinical scenario.   1719 CBC w/ Differential(!):   WBC 6.6  RBC 5.61(!)  HGB 17.0(!)  HCT 51.3(!)  MCV 91.3  MCH 30.3  MCHC 33.2  RDW 17.2(!)  MPV 8.0  Platelet 165  nRBC 0  Neutrophils % 69.3  Lymphocytes % 21.2  Monocytes % 7.2  Eosinophils % 1.3  Basophils % 1.0  Absolute Neutrophils 4.6  Absolute Lymphocytes 1.4  Absolute Monocytes  0.5  Absolute Eosinophils 0.1  Absolute Basophils  0.1  Anisocytosis Slight(!) CBC without leukocytosis with WBC 6.6, hemoglobin 17.0, platelets normal at 165  1719 Comprehensive Metabolic Panel:   Sodium 142  Potassium 3.9  Chloride 102  CO2 27.7  Anion Gap 12  Bun 9  Creatinine 0.81  BUN/Creatinine Ratio  11  eGFR CKD-EPI (2021) Male >90  Glucose 115  Calcium  9.8  Albumin 3.9  Total Protein 6.6  Total Bilirubin 1.1  SGOT (AST) 17  ALT 12  Alkaline Phosphatase 68 CMP without electrolyte abnormalities, normal creatinine, normal LFTS.  1720 hsTroponin I: 24 Initial troponin WNL at 24.   1918 EKG with afib with HR 105 BPM.  2141 hsTroponin I: 36  2141 delta hsTroponin I(!): 12 Repeat troponin (drawn 4 hours after initial troponin) is within normal limits at 36, but does have  elevated delta of 12. Given this, will proceed with 6 hour troponin. Difficult to interpret given drawn at 4 hours from initial instead of 2 hours, but reassured it is still within normal range.   6 hour troponin was WNL at 35, with delta of 1. Overall reassuring trend of troponins. Reassessed patient, his blood pressure is still elevated but has come down to 163/101, closer to his baseline. Discussed discharge planning with patient. He has a follow up appointment with his heart failure team tomorrow, but on further discussion reports that he will not be able to attend this due to transportation problems. Recommended that he call his team in the morning to see if he could do a phone or virtual visit, or get assistance with rescheduling and/or transport. Patient reports that he has been journaling his daily blood pressures on advisement from his doctor; likely for them to determine when to restart medications which were recently paused on recent admission. Given his persistent hypertension here and at home, with atrial fibrillation that has been borderline tachycardic, recommended patient restart his home metroprolol first. Recommended he discuss this with his cardiologist tomorrow. Patient discharged home with strict return precautions discussed. Patient is agreeable with the plan.   MDM Elements   I have reviewed recent and relevant previous record, including: Outpatient notes - 07/03/2024 Cardiology Note for history  Social Determinants that significantly affected care: N/A   ____________________________________________  The case was discussed with the attending physician, who is in agreement with the above assessment and plan.    History   Chief Complaint Chief Complaint  Patient presents with  . Chest Pain    HPI  Lawrence [name] is a 68 y.o. male with past medical history as below who presents with chest pressure. Patient reports constant chest pressure starting this when he woke up  this morning with associated hypertension. He endorses that his current chest pressure feels similar to what he experiences frequently; he estimates 2-3 episodes per week of similar chest pain. He also reports feeling like he is having a pins and needles sensation in his bilateral legs; denies numbness. Patient reports that he checked his bloodpressure this morning and it was elevated higher than his baseline (reports baseline of 150s/80s). He rechecked it several times but it continued to be high.   He has been taking his prescribed medications without missing any doses. Denies shortness of breath or fevers. On chart review, patient was recently hospitalized earlier this month and many of his medications were paused due to concern for orthostatic hypotension: medications that were discontinued were entresto  (sacubitril -valsartan ), imdur , metoprolol , midodrine , spironolactone . He follows up with his heart failure specialist tomorrow to discuss restarting medications. He reports that his cardiologist did tell him to monitor his blood pressure at home and write down the values so they could reassess at next visit. He reports blood pressure has been elevated at home.  Outside Historian(s): I have obtained additional history/collateral from none.  Past Medical History[1]  Past Surgical History[2]  No current facility-administered medications for this encounter.  Current Outpatient Medications:  .  albuterol  2.5 mg /3 mL (0.083 %) nebulizer solution, Inhale 3 mL (2.5 mg total) every six (6) hours as needed., Disp: , Rfl:  .  allopurinoL  (ZYLOPRIM ) 300 MG tablet, Take 1 tablet (300 mg total) by mouth two (2) times a day. (Patient not taking: Reported on 07/05/2024), Disp: , Rfl:  .  ALPRAZolam  (XANAX ) 1 MG tablet, Take 1 tablet (1 mg total) by mouth Three (3) times a day as needed for sleep., Disp: , Rfl:  .  apixaban  (ELIQUIS ) 5 mg Tab, Take 1 tablet (5 mg total) by mouth two (2) times a day., Disp: 90  tablet, Rfl: 3 .  atorvastatin  (LIPITOR ) 80 MG tablet, Take 1 tablet (80 mg total) by mouth every evening., Disp: 90 tablet, Rfl: 3 .  budesonide -glycopyr-formoterol  (BREZTRI  AEROSPHERE) 160-9-4.8 mcg/actuation inhaler, Inhale 2 puffs by mouth two times daily, Disp: 10.7 g, Rfl: 0 .  cetirizine  (ZYRTEC ) 10 MG tablet, Take 1 tablet (10 mg total) by mouth daily., Disp: , Rfl:  .  cholecalciferol , vitamin D3, (VITAMIN D3) 125 mcg (5,000 unit) tablet, Take 1 tablet (125 mcg total) by mouth daily. (Patient not taking: Reported on 07/05/2024), Disp: , Rfl:  .  dapagliflozin  (FARXIGA ) 10 mg Tab tablet, Take 1 tablet (10 mg total) by mouth every morning., Disp: , Rfl:  .  furosemide  (LASIX ) 20 MG tablet, Take 1 tablet (20 mg total) by mouth daily as needed for other (edema)., Disp: 30 tablet, Rfl: 0 .  lidocaine  (ASPERCREME) 4 % patch, Place 1 patch on the skin daily., Disp: 10 patch, Rfl: 0 .  lithium  150 MG capsule, Take 1 capsule (150 mg total) by mouth nightly., Disp: , Rfl:  .  metFORMIN  (GLUCOPHAGE -XR) 500 MG 24 hr tablet, Take 1 tablet (500 mg total) by mouth daily before breakfast., Disp: 90 tablet, Rfl: 0 .  methocarbamoL  (ROBAXIN ) 500 MG tablet, Take 1 tablet (500 mg total) by mouth every eight (8) hours as needed., Disp: , Rfl:  .  metoPROLOL  succinate (TOPROL -XL) 25 MG 24 hr tablet, Take 1 tablet (25 mg total) by mouth daily for 20 days., Disp: 20 tablet, Rfl: 0 .  nitroglycerin  (NITROSTAT ) 0.4 MG SL tablet, Place 1 tablet (0.4 mg total) under the tongue every five (5) minutes as needed for chest pain. Maximum of 3 doses in 15 minutes., Disp: , Rfl:  .  omega-3 acid ethyl esters (LOVAZA ) 1 gram capsule, Take 4 capsules (4 g total) by mouth in the morning., Disp: , Rfl:  .  oxyCODONE -acetaminophen  (PERCOCET) 10-325 mg per tablet, Take 1 tablet by mouth every four (4) hours as needed for pain., Disp: , Rfl:  .  OXYGEN -AIR DELIVERY SYSTEMS MISC, Inhale 2 L nightly., Disp: , Rfl:  .  pantoprazole   (PROTONIX ) 40 MG tablet, Take 1 tablet (40 mg total) by mouth daily before breakfast., Disp: 90 tablet, Rfl: 3 .  polyethylene glycol (GLYCOLAX ) 17 gram/dose powder, Dissolve one capful (17 grams) in 4 to 8 oz of fluid and drink by mouth two (2) times a day as needed (Constipation)., Disp: 510 g, Rfl: 2 .  ranolazine  (RANEXA ) 500 MG 12 hr tablet, Take 2 tablets (1,000 mg total) by mouth two (2) times a day., Disp: 60 tablet, Rfl: 0 .  traZODone  (DESYREL ) 100 MG tablet, Take 1 tablet (100 mg total) by mouth nightly., Disp: , Rfl:  .  vortioxetine  (  TRINTELLIX ) 10 mg tablet, Take 1 tablet (10 mg total) by mouth nightly., Disp: , Rfl:   Allergies Meperidine, Prednisone , Sulfasalazine, Sulfa (sulfonamide antibiotics), Albuterol , Albuterol  sulfate, Empagliflozin , and Morphine   Family History Family History[3]  Social History Short Social History[4]   Physical Exam   VITAL SIGNS:    Vitals:   07/12/24 2116 07/12/24 2200 07/12/24 2309 07/12/24 2310  BP: 172/120 155/114 163/101   Pulse: 97 86 85   Resp: 11 15 12    Temp:    36.5 C (97.7 F)  TempSrc:    Oral  SpO2: 97% 94% 95%   Weight:        Constitutional: Alert and oriented. No acute distress. Eyes: Conjunctivae are normal. HEENT: Normocephalic and atraumatic. Conjunctivae clear. No congestion. Moist mucous membranes.  Cardiovascular: Rate as above, regular rhythm. Normal and symmetric distal pulses. Brisk capillary refill. Normal skin turgor. Respiratory: Normal respiratory effort. Mild end expiratory wheezing. Gastrointestinal: Soft, non-distended, non-tender. Genitourinary: Deferred. Musculoskeletal: Non-tender with normal range of motion in all extremities. Neurologic: Normal speech and language. No gross focal neurologic deficits are appreciated. Patient is moving all extremities equally, face is symmetric at rest and with speech. Skin: Skin is warm, dry and intact. No rash noted. Psychiatric: Mood and affect are normal.  Speech and behavior are normal.   Radiology   XR Chest 2 views  Final Result    Mildly increased interstitial markings can be seen with mild interstitial pulmonary edema or atypical infection, depending on the clinical scenario.            Pertinent labs & imaging results that were available during my care of the patient were independently interpreted by me and considered in my medical decision making (see chart for details).  Portions of this record have been created using Scientist, clinical (histocompatibility and immunogenetics). Dictation errors have been sought, but may not have been identified and corrected.  Documentation assistance was provided by Max Caza, Scribe on July 12, 2024 at 4:05 PM for Mirna Grumbling, MD.  Documentation assistance was provided by the scribe in my presence.  The documentation recorded by the scribe has been reviewed by me and accurately reflects the services I personally performed.        [1] Past Medical History: Diagnosis Date  . CAD (coronary artery disease)    s/p anterior STEMI (95% LAD) on 04/27/01. Stent x2 to LAD; S/p iatrogenic pLAD dissection 04/27/01 -> CABG x2 (IMA->LAD, SVG->OM1+2) on 04/27/01 ; In 11/13, catheterization revealed 70% OM1 lesion -> PCI with Xience stent.  . Chronic back pain   . COPD (chronic obstructive pulmonary disease)       . DM (diabetes mellitus)       . Enlarged prostate   . Fatty liver   . GERD (gastroesophageal reflux disease)   . HLD (hyperlipidemia)   . Hypertension   . Myocardial infarction       . OSA (obstructive sleep apnea)   . Pancreatitis (HHS-HCC)    last episode 2013 per pt  [2] Past Surgical History: Procedure Laterality Date  . ARTERIAL BYPASS SURGERY    . CARDIAC CATHETERIZATION    . CARDIAC SURGERY    . CHOLECYSTECTOMY    . CORONARY STENT PLACEMENT    . PR CATH PLACE/CORON ANGIO, IMG SUPER/INTERP,W LEFT HEART VENTRICULOGRAPHY N/A 08/21/2015   Procedure: Left Heart Catheterization W Intervention;  Surgeon:  Zachary Prentice Car, MD;  Location: [facility] CATH;  Service: Cardiology  . PR CATH PLACE/CORON ANGIO, IMG SUPER/INTERP,W LEFT HEART  VENTRICULOGRAPHY N/A 06/09/2018   Procedure: Left Heart Catheterization Coronary Angiography Bypass graft angiography;  Surgeon: Donnice Beverley Chester, MD;  Location: [facility] CATH;  Service: Cardiology  . PR CATH PLACE/CORON ANGIO, IMG SUPER/INTERP,W LEFT HEART VENTRICULOGRAPHY N/A 08/17/2023   Procedure: Left Heart Catheterization;  Surgeon: Gil [name] Barter, MD;  Location: [facility] CATH;  Service: Cardiology  . PR UPPER GI ENDOSCOPY,DIAGNOSIS N/A 07/05/2024   Procedure: UGI ENDO, INCLUDE ESOPHAGUS, STOMACH, & DUODENUM &/OR JEJUNUM; DX W/WO COLLECTION SPECIMN, BY BRUSH OR WASH;  Surgeon: Brenita Vannie BIRCH, MD;  [facility];  Service: Gastroenterology  . TONSILLECTOMY AND ADENOIDECTOMY    [3] Family History Problem Relation Age of Onset  . Heart disease Maternal Grandfather   . Heart attack Maternal Grandfather   . Heart disease Paternal Grandfather   . Heart attack Paternal Grandfather   . Parkinsonism Paternal Grandfather   . Heart disease Mother   . COPD Mother   . Heart disease Father   . Cirrhosis Father   . Hypertension Father   . COPD Father   . Parkinsonism Brother   [4] Social History Tobacco Use  . Smoking status: Former    Current packs/day: 0.00    Average packs/day: 1 pack/day for 50.0 years (50.0 ttl pk-yrs)    Types: Cigarettes    Start date: 05/03/1963    Quit date: 05/02/2013    Years since quitting: 11.2  . Smokeless tobacco: Never  . Tobacco comments:    Pt states he used to smoke 3.5ppd for about 50 years, but quit 10 years ago.  Vaping Use  . Vaping status: Never Used  Substance Use Topics  . Alcohol use: No    Alcohol/week: 0.0 standard drinks of alcohol  . Drug use: Not Currently   Oakeson, Lyndsay, MD Resident 07/13/24 [id number]

## 2024-07-13 ENCOUNTER — Other Ambulatory Visit

## 2024-07-13 ENCOUNTER — Ambulatory Visit

## 2024-07-13 ENCOUNTER — Encounter: Admitting: Family

## 2024-07-13 NOTE — Progress Notes (Deleted)
 Advanced Heart Failure Clinic Note    PCP: [name], MD  Cardiologist: [facility] cardiology (last seen 06/25)  Chief Complaint:    HPI:  Lawrence [name] is a 67 y/o male with a history of paroxysmal atrial fibrillation, coronary artery disease s/p CABG X2 with LIMA to the LAD and SVG to OM (2002); procedure was complicated by coronary artery dissection, asthma, osteoarthritis, hyperlipidemia, GERD, anxiety, DDD, diverticulosis, COPD, CKD essential hypertension and PSVT, type II diabetes mellitus, OSA & chronic heart failure. S/p PCI/DES to mid LAD with subsequent PTCA (2016), proximal to mid LAD stents (2014), distal LAD stent (unknown date), stent in the ostium SVG (unknown date) & circumflex/OM 1 bifucation stent (2013).   Has had 11 caths since 2014  Echo 05/01/23: EF 50-55% with mild LVH and mild/ moderate LAE  Was in the ED 05/14/23 due to 20 episodes of diarrhea. Abdominal CT negative and negative for e coli. Antibiotics provided.   Admitted 05/16/23 due to acute onset of midsternal and left-sided chest pain felt as tightness and graded 9/10 in severity with no radiation however with associated nausea as well as dyspnea and palpitations. Per Dr. Ammon, likely that one of the patient's bypass grafts has occluded with his recent NSTEMI (per his review of films, unlikely to be amenable to an intervention. IV diuresed. Has had 7 ED visits July 2024, 2 ED visits Aug 2024.   Echo 06/10/24: EF 45-50%, mild LVH, G1DD  Admitted 07/11/23 due to acute onset of midsternal chest pain, nausea and diaphoresis. He described the sensation as an elephant sitting on his chest. He also complained of cough productive of yellowish sputum and wheezing. Was recently treated for covid for which he was given molnupiravir  & completed 07/13/23. Chest pain, mildly elevated but flat troponins: He was seen by the cardiologist. Chest pain thought to be noncardiac in etiology. Cardiology consult done.    Since seen in  [facility] 09/24, he has been admitted 12 times and been in the ED 48 times. Majority of times has been for chest pain.   Seen in [facility] 07/03/24 where midodrine  was decreased for 3 days and then was to be stopped due to elevated BP.   Seen in ED 07/07/24 with HA and elevated BP. CT head without acute bleed. EKG and troponin x 2 minimally elevated, stable compared to priors.   Seen in ED 07/12/24 with chest pain. Troponins stable. Had an episode of slow Afib to 30s, with dec in BP, worsening chest pain, weakness, feeling of doom. Given metoprolol  25mg  daily.   He presents today for a HF f/u visit with a chief complaint of    Previous cardiac studies:  Echo 06/26/15 EF 60-65% along with mild LVH and trivial AR.  Echo 04/19/18: EF 50-55% Echo 03/08/20: EF 45-50% along with mild Lawrence Echo 11/02/22: EF 50-55% with mild LVH, moderate LAE  LHC 11/02/22:   Prox LAD to Mid LAD lesion is 30% stenosed.   Mid LAD lesion is 40% stenosed.   Prox RCA lesion is 15% stenosed.   Dist RCA lesion is 25% stenosed.   1st Mrg lesion is 70% stenosed.   RPAV-2 lesion is 60% stenosed.   RPAV-1 lesion is 85% stenosed with 85% stenosed side branch in 1st RPL.   RPDA-1 lesion is 90% stenosed.   RPDA-2 lesion is 85% stenosed.   Non-stenotic Mid LAD to Dist LAD lesion was previously treated.   Non-stenotic Prox Cx to Mid Cx lesion was previously treated.  Non-stenotic Origin to Prox Graft lesion was previously treated.   LIMA and is small.   There is mild left ventricular systolic dysfunction.   LV end diastolic pressure is mildly elevated.   The left ventricular ejection fraction is 45-50% by visual estimate.  Plan LHC from right  groin because of his persistent anginal symptoms.  Chronic angina Left ventriculogram shows mildly dilated LV with apical hypokinesis EF around 45-50 Coronaries Left main mild irregularities LAD mild to moderate disease.  Diagonal 1 Circumflex large minor irregularities occluded OM1 RCA large  minor irregularities moderate disease TIMI-3 flow right dominant No significant collaterals  Grafts LIMA to mid LAD atretic SVG to OM1 widely patent  Patient cath essentially improved from previous procedure done 04/2021.  No areas of significant obstructive disease requiring intervention. Recommend aggressive medical therapy for chronic chest pain appears to mostly be noncardiac   ROS: All systems negative except as listed in HPI, PMH and Problem List.  SH:  Social History   Socioeconomic History   Marital status: Widowed    Spouse name: Not on file   Number of children: 1   Years of education: Not on file   Highest education level: 10th grade  Occupational History   Occupation: Disabled   Occupation: on Tree surgeon  Tobacco Use   Smoking status: Former    Current packs/day: 0.00    Average packs/day: 3.0 packs/day for 50.0 years (150.0 ttl pk-yrs)    Types: Cigarettes    Start date: 04/23/1963    Quit date: 04/22/2013    Years since quitting: 11.2   Smokeless tobacco: Never   Tobacco comments:    Reports not smoking for approx 8 years.  Vaping Use   Vaping status: Never Used  Substance and Sexual Activity   Alcohol use: No    Comment: Hx Alcohol Abuse   Drug use: Not Currently    Types: Marijuana   Sexual activity: Not Currently  Other Topics Concern   Not on file  Social History Narrative   Pt lives in [place] with wife.  Does not routinely exercise.   Social Drivers of Corporate investment banker Strain: Low Risk  (06/19/2024)   Received from [facility]   Overall Financial Resource Strain (CARDIA)    How hard is it for you to pay for the very basics like food, housing, medical care, and heating?: Not very hard  Food Insecurity: Food Insecurity Present (06/16/2024)   Hunger Vital Sign    Worried About Running Out of Food in the Last Year: Sometimes true    Ran Out of Food in the Last Year: Never true  Transportation Needs: No Transportation Needs  (06/16/2024)   PRAPARE - Administrator, Civil Service (Medical): No    Lack of Transportation (Non-Medical): No  Physical Activity: Insufficiently Active (03/14/2024)   Exercise Vital Sign    Days of Exercise per Week: 3 days    Minutes of Exercise per Session: 20 min  Stress: No Stress Concern Present (03/14/2024)   [employer] of Occupational Health - Occupational Stress Questionnaire    Feeling of Stress : Only a little  Recent Concern: Stress - Stress Concern Present (01/13/2024)   [employer] of Occupational Health - Occupational Stress Questionnaire    Feeling of Stress : To some extent  Social Connections: Socially Isolated (06/10/2024)   Social Connection and Isolation Panel    Frequency of Communication with Friends and Family: More than three times a  week    Frequency of Social Gatherings with Friends and Family: Once a week    Attends Religious Services: Never    Database administrator or Organizations: No    Attends Banker Meetings: Never    Marital Status: Widowed  Intimate Partner Violence: Not At Risk (06/16/2024)   Humiliation, Afraid, Rape, and Kick questionnaire    Fear of Current or Ex-Partner: No    Emotionally Abused: No    Physically Abused: No    Sexually Abused: No    FH:  Family History  Problem Relation Age of Onset   Heart attack Mother    Depression Mother    Heart disease Mother    COPD Mother    Hypertension Mother    Heart attack Father    Diabetes Father    Depression Father    Heart disease Father    Cirrhosis Father    Parkinson's disease Brother     Past Medical History:  Diagnosis Date   A-fib (HCC)    Anemia    Anginal pain (HCC)    Anxiety    Arthritis    Asthma    CAD (coronary artery disease)    a. 2002 CABGx2 (LIMA->LAD, VG->VG->OM1);  b. 09/2012 DES->OM;  c. 03/2015 PTCA of LAD ([facility]) in setting of atretic LIMA; d. 05/2015 Cath [facility]): nonobs dzs; e. 06/2015 Cath ([facility]): LM nl, LAD 45p/d ISR,  50d, D1/2 small, LCX 50p/d ISR, OM1 70ost, 30 ISR, VG->OM1 50ost, 71m, LIMA->LAD 99p/d - atretic, RCA dom, nl; f.cath 10/16: 40-50%(FFR 0.90) pLAD, 75% (FFR 0.77) mLAD s/p PCI/DES, oRCA 40% (FFR0.95)   Cancer (HCC)    SKIN CANCER ON BACK   Celiac disease    Chronic diastolic CHF (congestive heart failure) (HCC)    a. 06/2009 Echo: EF 60-65%, Gr 1 DD, triv AI, mildly dil LA, nl RV.   COPD (chronic obstructive pulmonary disease) (HCC)    a. Chronic bronchitis and emphysema.   DDD (degenerative disc disease), lumbar    Diverticulosis    Dysrhythmia    Essential hypertension    GERD (gastroesophageal reflux disease)    History of hiatal hernia    History of kidney stones    H/O   History of tobacco abuse    a. Quit 2014.   Myocardial infarction [facility]) 2002   4 STENTS   Pancreatitis    PSVT (paroxysmal supraventricular tachycardia) (HCC)    a. 10/2012 Noted on Zio Patch.   Sleep apnea    LOST CORD TO CPAP -ONLY 02 @ BEDTIME   Tubular adenoma of colon    Type II diabetes mellitus (HCC)       PHYSICAL EXAM:  General: Disheveled appearing.  Cor: No JVD. Irregular rhythm, normal rate.  Lungs: sporadic expiratory wheezing Abdomen: soft, nontender, nondistended. Extremities: trace edema around right ankle Neuro:. Affect pleasant   ECG: not done   ASSESSMENT & PLAN:  1: Chronic ischemic heart failure with mildly reduced ejection fraction- - CABG in addition to PCI/DES to mid LAD with subsequent PTCA (2016), proximal to mid LAD stents (2014), distal LAD stent (unknown date), stent in the ostium SVG (unknown date) & circumflex/OM 1 bifucation stent (2013).  - NYHA class III - euvolemic - weight 182.2 pounds from last visit here 10 days ago - Echo 06/26/15 EF 60-65% along with mild LVH and trivial AR.  - Echo 04/19/18: EF 50-55% - Echo 03/08/20: EF 45-50% along with mild Lawrence - Echo 11/02/22: EF 50-55%  with mild LVH, moderate LAE - Echo 05/01/23: EF 50-55% with mild LVH and mild/  moderate LAE - Echo 06/10/24: EF 45-50%, mild LVH, G1DD - continue farxiga  10mg  daily - continue furosemide  20mg  daily - continue metoprolol  succinate 25mg  daily - entresto  and spironolactone  remain on hold as he's had hypotension in the past.  - jardiance  caused perineal pain - BNP 06/30/24 was 297.6  2: HTN- - BP  - BP log given and instructed to continue checking BP at home and bring log to each visit - saw PCP Jeralyn) 08/25 - BMP 07/07/24 reviewed: sodium 138, potassium 3.6, creatinine 0.93 & GFR >60 - BMET today.   3: CAD- - saw [facility] cardiology 06/25 - continue isosorbide  MN 120mg  daily - continue plavix  75mg  daily - continue ranexa  1000mg  BID - LHC 11/02/22:   Prox LAD to Mid LAD lesion is 30% stenosed.   Mid LAD lesion is 40% stenosed.   Prox RCA lesion is 15% stenosed.   Dist RCA lesion is 25% stenosed.   1st Mrg lesion is 70% stenosed.   RPAV-2 lesion is 60% stenosed.   RPAV-1 lesion is 85% stenosed with 85% stenosed side branch in 1st RPL.   RPDA-1 lesion is 90% stenosed.   RPDA-2 lesion is 85% stenosed.   Non-stenotic Mid LAD to Dist LAD lesion was previously treated.   Non-stenotic Prox Cx to Mid Cx lesion was previously treated.   Non-stenotic Origin to Prox Graft lesion was previously treated.   LIMA and is small.   There is mild left ventricular systolic dysfunction.   LV end diastolic pressure is mildly elevated.   The left ventricular ejection fraction is 45-50% by visual estimate.   4: Atrial fibrillation- - continue eliquis  5mg  BID - continue amiodarone  200mg  daily - irregular rhythm. EF too high for AICD & HR has been in 80's-90's. He will f/u with [facility] cardiology to discuss further about possible pacemaker  5: DM- - A1c 02/01/24 was 5.8% - continue metformin   6: Hyperlipidemia- - LDL 10/19/23 was 80 with triglycerides of 156 - continue atorvastatin  80mg  daily     Ellouise Class, FNP-C 07/13/24

## 2024-07-13 NOTE — Care Plan (Signed)
 Voucher for Transportation and [employer] [facility] Emergency Department  Request Date 07/13/2024 Type of Transportation: Discharge Transportation Pick up location: [facility]  Patient Lawrence [name] Patient Facility: [facility] Department Authorizing this voucher: [facility] Emergency Department     Transportation Voucher Info Discharge Voucher Type: Taxi: [address] [place] [address] and Taxi OperatorBETHA Joane Haller [phone]   Amount: $   [facility] Customer ID: [id number] NO ADDITIONAL STOPS

## 2024-07-17 ENCOUNTER — Emergency Department

## 2024-07-17 ENCOUNTER — Other Ambulatory Visit: Payer: Self-pay

## 2024-07-17 ENCOUNTER — Emergency Department
Admission: EM | Admit: 2024-07-17 | Discharge: 2024-07-17 | Disposition: A | Discharge status: Home / Self Care | Attending: Emergency Medicine | Admitting: Emergency Medicine

## 2024-07-17 DIAGNOSIS — N189 Chronic kidney disease, unspecified: Secondary | ICD-10-CM | POA: Diagnosis not present

## 2024-07-17 DIAGNOSIS — E1122 Type 2 diabetes mellitus with diabetic chronic kidney disease: Secondary | ICD-10-CM | POA: Insufficient documentation

## 2024-07-17 DIAGNOSIS — Z951 Presence of aortocoronary bypass graft: Secondary | ICD-10-CM | POA: Insufficient documentation

## 2024-07-17 DIAGNOSIS — I2510 Atherosclerotic heart disease of native coronary artery without angina pectoris: Secondary | ICD-10-CM | POA: Diagnosis not present

## 2024-07-17 DIAGNOSIS — R079 Chest pain, unspecified: Secondary | ICD-10-CM | POA: Diagnosis present

## 2024-07-17 DIAGNOSIS — G8929 Other chronic pain: Secondary | ICD-10-CM | POA: Insufficient documentation

## 2024-07-17 DIAGNOSIS — R0789 Other chest pain: Secondary | ICD-10-CM | POA: Diagnosis not present

## 2024-07-17 LAB — CBC
HCT: 50 % (ref 39.0–52.0)
Hemoglobin: 16.2 g/dL (ref 13.0–17.0)
MCH: 29.7 pg (ref 26.0–34.0)
MCHC: 32.4 g/dL (ref 30.0–36.0)
MCV: 91.7 fL (ref 80.0–100.0)
Platelets: 166 K/uL (ref 150–400)
RBC: 5.45 MIL/uL (ref 4.22–5.81)
RDW: 14.9 % (ref 11.5–15.5)
WBC: 6.4 K/uL (ref 4.0–10.5)
nRBC: 0 % (ref 0.0–0.2)

## 2024-07-17 LAB — URINALYSIS, ROUTINE W REFLEX MICROSCOPIC
Bilirubin Urine: NEGATIVE
Glucose, UA: >=500 mg/dL — AB
Hgb urine dipstick: NEGATIVE
Ketones, ur: NEGATIVE mg/dL
Leukocytes,Ua: NEGATIVE
Nitrite: NEGATIVE
Protein, ur: NEGATIVE mg/dL
Specific Gravity, Urine: 1.025 (ref 1.005–1.030)
Squamous Epithelial / HPF: 0 /HPF (ref 0–5)
pH: 5 (ref 5.0–8.0)

## 2024-07-17 LAB — BASIC METABOLIC PANEL WITH GFR
Anion gap: 8 (ref 5–15)
BUN: 21 mg/dL (ref 8–23)
CO2: 29 mmol/L (ref 22–32)
Calcium: 9.6 mg/dL (ref 8.9–10.3)
Chloride: 103 mmol/L (ref 98–111)
Creatinine, Ser: 1.14 mg/dL (ref 0.61–1.24)
GFR, Estimated: >60 mL/min (ref >60)
Glucose, Bld: 114 mg/dL — ABNORMAL HIGH (ref 70–99)
Potassium: 4.2 mmol/L (ref 3.5–5.1)
Sodium: 140 mmol/L (ref 135–145)

## 2024-07-17 LAB — TROPONIN I (HIGH SENSITIVITY)
Troponin I (High Sensitivity): 23 ng/L — ABNORMAL HIGH (ref <18)
Troponin I (High Sensitivity): 23 ng/L — ABNORMAL HIGH (ref <18)

## 2024-07-17 MED ORDER — OXYCODONE HCL 5 MG PO TABS
5.0000 mg | ORAL_TABLET | Freq: Once | ORAL | Status: AC
  Administered 2024-07-17: 5 mg via ORAL
  Filled 2024-07-17: qty 1

## 2024-07-17 MED ORDER — ONDANSETRON 4 MG PO TBDP
4.0000 mg | ORAL_TABLET | Freq: Once | ORAL | Status: AC
  Administered 2024-07-17: 4 mg via ORAL
  Filled 2024-07-17: qty 1

## 2024-07-17 MED ORDER — ACETAMINOPHEN 500 MG PO TABS
1000.0000 mg | ORAL_TABLET | Freq: Once | ORAL | Status: AC
  Administered 2024-07-17: 1000 mg via ORAL
  Filled 2024-07-17: qty 2

## 2024-07-17 NOTE — ED Provider Notes (Signed)
 [facility] Provider Note    Event Date/Time   First MD Initiated Contact with Patient 07/17/24 1904     (approximate)   History   Chest Pain   HPI  Lawrence [name] is a 68 y.o. male who presents to the ED for evaluation of Chest Pain   I review a CHF clinic visit from 8/18.  History of paroxysmal A-fib, CAD s/p CABG x 2, anxiety, CKD, DM.  Frequent ED visits for chest pain.  Patient presents for evaluation of 2 days of chest pain   Physical Exam   Triage Vital Signs: ED Triage Vitals  Encounter Vitals Group     BP 07/17/24 1904 (!) 178/89     Girls Systolic BP Percentile --      Girls Diastolic BP Percentile --      Boys Systolic BP Percentile --      Boys Diastolic BP Percentile --      Pulse Rate 07/17/24 1904 91     Resp 07/17/24 1904 12     Temp 07/17/24 1904 97.8 F (36.6 C)     Temp Source 07/17/24 1904 Oral     SpO2 07/17/24 1904 99 %     Weight 07/17/24 1902 179 lb (81.2 kg)     Height 07/17/24 1902 5' 7 (1.702 m)     Head Circumference --      Peak Flow --      Pain Score 07/17/24 1901 7     Pain Loc --      Pain Education --      Exclude from Growth Chart --     Most recent vital signs: Vitals:   07/17/24 2200 07/17/24 2230  BP: (!) 162/109 (!) 149/98  Pulse: 86 82  Resp: (!) 21 18  Temp:    SpO2: 100% 96%    General: Awake, no distress.  CV:  Good peripheral perfusion.  Resp:  Normal effort.  Abd:  No distention.  MSK:  No deformity noted.  Neuro:  No focal deficits appreciated. Other:     ED Results / Procedures / Treatments   Labs (all labs ordered are listed, but only abnormal results are displayed) Labs Reviewed  BASIC METABOLIC PANEL WITH GFR - Abnormal; Notable for the following components:      Result Value   Glucose, Bld 114 (*)    All other components within normal limits  URINALYSIS, ROUTINE W REFLEX MICROSCOPIC - Abnormal; Notable for the following components:   Color, Urine YELLOW (*)     APPearance CLEAR (*)    Glucose, UA >=500 (*)    Bacteria, UA RARE (*)    All other components within normal limits  TROPONIN I (HIGH SENSITIVITY) - Abnormal; Notable for the following components:   Troponin I (High Sensitivity) 23 (*)    All other components within normal limits  TROPONIN I (HIGH SENSITIVITY) - Abnormal; Notable for the following components:   Troponin I (High Sensitivity) 23 (*)    All other components within normal limits  CBC    EKG Tremulous baseline clouds fine detail.  Seems to be A-fib with a rate of 80 bpm without STEMI.  RADIOLOGY CXR interpreted by me without evidence of acute cardiopulmonary pathology.  Official radiology report(s): DG Chest Port 1 View Result Date: 07/17/2024 CLINICAL DATA:  Chest pain EXAM: PORTABLE CHEST 1 VIEW COMPARISON:  None Available. FINDINGS: Lungs are well expanded, symmetric, and clear. No pneumothorax or pleural effusion. Coronary artery  bypass grafting has been performed. Cardiac size is within normal limits. Pulmonary vascularity is normal. Osseous structures are age-appropriate. No acute bone abnormality. IMPRESSION: No active disease. Electronically Signed   By: Dorethia Molt M.D.   On: 07/17/2024 20:13    PROCEDURES and INTERVENTIONS:  .1-3 Lead EKG Interpretation  Performed by: Claudene Rover, MD Authorized by: Claudene Rover, MD     Interpretation: normal     ECG rate:  80   ECG rate assessment: normal     Rhythm: sinus rhythm     Ectopy: none     Conduction: normal     Medications  acetaminophen  (TYLENOL ) tablet 1,000 mg (1,000 mg Oral Given 07/17/24 1952)  oxyCODONE  (Oxy IR/ROXICODONE ) immediate release tablet 5 mg (5 mg Oral Given 07/17/24 1952)  ondansetron  (ZOFRAN -ODT) disintegrating tablet 4 mg (4 mg Oral Given 07/17/24 2200)     IMPRESSION / MDM / ASSESSMENT AND PLAN / ED COURSE  I reviewed the triage vital signs and the nursing notes.  Differential diagnosis includes, but is not limited to, ACS, PTX, PNA,  muscle strain/spasm, PE, dissection, anxiety, pleural effusion  {Patient presents with symptoms of an acute illness or injury that is potentially life-threatening.  Patient presents with chronic chest pain without signs of acute pathology, controlled symptoms and suitable for outpatient management.  Normal CBC, metabolic panel and 2 troponins are flat and chronically minimally elevated.  EKG without acute ischemic features.  I considered admission for this patient but ultimately believe he be suitable for outpatient management.  Discussed close return precautions and cardiology follow-up.  Clinical Course as of 07/17/24 2248  Mon Jul 17, 2024  2206 Reassessed, patient reports some mild nausea, pain is improved.  We discussed reassuring workup and plan of care.  He is agreeable. [DS]    Clinical Course User Index [DS] Claudene Rover, MD     FINAL CLINICAL IMPRESSION(S) / ED DIAGNOSES   Final diagnoses:  Other chest pain  Chronic chest pain     Rx / DC Orders   ED Discharge Orders     None        Note:  This document was prepared using Dragon voice recognition software and may include unintentional dictation errors.   Claudene Rover, MD 07/17/24 2249

## 2024-07-17 NOTE — ED Triage Notes (Signed)
 Pt brought in by EMS from home for intermittent mid sternal chest pain x2-3 days. Hx AFIB, CHF, diabetic, double bypass 2002. 324mg  aspirin , 1 inch nitro given by EMS.

## 2024-07-19 ENCOUNTER — Other Ambulatory Visit: Payer: Self-pay | Admitting: Family Medicine

## 2024-07-20 ENCOUNTER — Other Ambulatory Visit: Payer: Self-pay

## 2024-07-20 ENCOUNTER — Telehealth: Payer: Self-pay

## 2024-07-20 NOTE — Patient Outreach (Signed)
 Complex Care Management   Visit Note  07/20/2024  Name:  Lawrence [name] MRN: [id number] DOB: [birth year]  Situation: Referral received for Complex Care Management related to Heart Failure and COPD I obtained verbal consent from Patient.  Visit completed with   Background:   Past Medical History:  Diagnosis Date   A-fib (HCC)    Anemia    Anginal pain (HCC)    Anxiety    Arthritis    Asthma    CAD (coronary artery disease)    a. 2002 CABGx2 (LIMA->LAD, VG->VG->OM1);  b. 09/2012 DES->OM;  c. 03/2015 PTCA of LAD [facility]) in setting of atretic LIMA; d. 05/2015 Cath [facility]): nonobs dzs; e. 06/2015 Cath ([facility]): LM nl, LAD 45p/d ISR, 50d, D1/2 small, LCX 50p/d ISR, OM1 70ost, 30 ISR, VG->OM1 50ost, 50m, LIMA->LAD 99p/d - atretic, RCA dom, nl; f.cath 10/16: 40-50%(FFR 0.90) pLAD, 75% (FFR 0.77) mLAD s/p PCI/DES, oRCA 40% (FFR0.95)   Cancer (HCC)    SKIN CANCER ON BACK   Celiac disease    Chronic diastolic CHF (congestive heart failure) (HCC)    a. 06/2009 Echo: EF 60-65%, Gr 1 DD, triv AI, mildly dil LA, nl RV.   COPD (chronic obstructive pulmonary disease) (HCC)    a. Chronic bronchitis and emphysema.   DDD (degenerative disc disease), lumbar    Diverticulosis    Dysrhythmia    Essential hypertension    GERD (gastroesophageal reflux disease)    History of hiatal hernia    History of kidney stones    H/O   History of tobacco abuse    a. Quit 2014.   Myocardial infarction [facility]) 2002   4 STENTS   Pancreatitis    PSVT (paroxysmal supraventricular tachycardia) (HCC)    a. 10/2012 Noted on Zio Patch.   Sleep apnea    LOST CORD TO CPAP -ONLY 02 @ BEDTIME   Tubular adenoma of colon    Type II diabetes mellitus (HCC)        Assessment: Patient Reported Symptoms: Cognitive Cognitive Status: Alert and oriented to person, place, and time, Normal speech and language skills Cognitive/Intellectual Conditions Management [RPT]: Other Other: Reports episodes of short term memory decline. Will  follow up with the Neurology team in November Health Maintenance Behaviors: Exercise, Healthy diet Healing Pattern: Unsure Health Facilitated by: Stress management, Rest, Pain control  Neurological Neurological Review of Symptoms: Weakness Oher Neurological Symptoms/Conditions [RPT]: Reports generalized weakness which has been a chronic issue Neurological Self-Management Outcome: 4 (good)  HEENT HEENT Symptoms Reported: No symptoms reported HEENT Management Strategies: Routine screening HEENT Self-Management Outcome: 4 (good)  Cardiovascular Cardiovascular Symptoms Reported: No symptoms reported Cardiovascular Management Strategies: Fluid modification, Medication therapy, Medical device, Coping strategies Do You Have a Working Readable Scale?: Yes Weight: 182 lb (82.6 kg) Cardiovascular Self-Management Outcome: 4 (good)  Respiratory Respiratory Symptoms Reported: Other: Other Respiratory Symptoms: Continues to experience dyspnea and wheezing with minimal exertion. Reports episodes of were quickly resolved with rest.  Endocrine Endocrine Symptoms Reported: No symptoms reported  Gastrointestinal Gastrointestinal Symptoms Reported: No symptoms reported  Genitourinary Genitourinary Symptoms Reported: Incontinence Additional Genitourinary Details: Reports episodes of incontinence. Managing with incontinence garments. Genitourinary Self-Management Outcome: 4 (good)  Integumentary Integumentary Symptoms Reported: No symptoms reported Skin Self-Management Outcome: 4 (good)  Musculoskeletal Musculoskelatal Symptoms Reviewed: Unsteady gait, Limited mobility, Weakness Additional Musculoskeletal Details: Reports using walker as instructed with all ambulation. Musculoskeletal Self-Management Outcome: 4 (good)  Psychosocial Psychosocial Symptoms Reported: No symptoms reported, Other Other Psychosocial Conditions: Denies symptoms today,  however reports receiving a notice that his Pyschiatric provider is  no longer in practice and he will need a plan for medication refills until he establishes care with his new provider. His PCP is aware Quality of Family Relationships: supportive, involved Do you feel physically threatened by others?: No   There were no vitals filed for this visit.  Medications Reviewed Today     Reviewed by Karoline Lima, RN (Registered Nurse) on 07/20/24 at 1304  Med List Status: <None>   Medication Order Taking? Sig Documenting Provider Last Dose Status Informant  Accu-Chek Softclix Lancets lancets [id number]  Use as instructed to check sugar daily for type 2 diabetes. [name], MD  Active Self  albuterol  (PROVENTIL ) (2.5 MG/3ML) 0.083% nebulizer solution [id number]  Take 3 mLs (2.5 mg total) by nebulization every 6 (six) hours as needed for wheezing or shortness of breath. [name], MD  Active Self  allopurinol  (ZYLOPRIM ) 300 MG tablet [id number]  TAKE 1 TABLET BY MOUTH TWICE A DAY [name], MD  Active Self  ALPRAZolam  (XANAX ) 1 MG tablet [id number]  TAKE 1 TABLET BY MOUTH 3 TIMES A DAY AS NEEDED [name], MD  Active Self  aluminum -magnesium  hydroxide-simethicone  (MAALOX) 200-200-20 MG/5ML SUSP [id number]  Take 30 mLs by mouth 3 (three) times daily as needed. [name], MD  Active Self  apixaban  (ELIQUIS ) 5 MG TABS tablet [id number]  Take 1 tablet (5 mg total) by mouth 2 (two) times daily. [name], MD  Active Self  atorvastatin  (LIPITOR ) 80 MG tablet [id number]  TAKE 1 TABLET BY MOUTH AT BEDTIME [name], MD  Active Self  Blood Glucose Calibration (ACCU-CHEK GUIDE CONTROL) [name] [id number]  Use with blood glucose monitor as directed [name], MD  Active Self  Blood Glucose Monitoring Suppl (ACCU-CHEK GUIDE) w/Device KIT [id number]  Use to check blood sugars as directed [name], MD  Active Self  BREZTRI  AEROSPHERE 160-9-4.8 MCG/ACT AERO inhaler [id number]  INHALE 2 PUFFS BY MOUTH INTO THE LUNGS 2TIMES DAILY [name], MD  Active Self  cetirizine  (ZYRTEC ) 10 MG tablet [id number]  Take 1 tablet (10 mg total) by mouth at bedtime. [name], MD  Active Self  cholecalciferol  (VITAMIN D3) 25 MCG (1000 UNIT) tablet [id number]  Take 1,000 Units by mouth daily. [provider]  Active   Cyanocobalamin  (VITAMIN B-12) 1000 MCG SUBL [id number]  Place 1 tablet under the tongue daily. [provider]  Active Self  dapagliflozin  propanediol (FARXIGA ) 10 MG TABS tablet [id number]  TAKE 1 TABLET BY MOUTH DAILY [name], MD  Active Self  famotidine  (PEPCID ) 20 MG tablet [id number]  Take 1 tablet (20 mg total) by mouth 2 (two) times daily. [name], MD  Active Self  furosemide  (LASIX ) 20 MG tablet [id number]  Take 20 mg by mouth. [provider]  Active   glucose blood (ACCU-CHEK GUIDE) test strip [id number]  Use as instructed to check sugar daily for type 2 diabetes. [name], MD  Active Self  iron  polysaccharides [facility] 150) 150 MG capsule [id number]  Take one capsul every Monday, Wednesday, and Friday [name], MD  Active Self  isosorbide  mononitrate (IMDUR ) 120 MG 24 hr tablet [id number]  Take 1 tablet (120 mg total) by mouth daily. Von Bellis, MD  Active Self           Med Note BEVERLEE, JOYCE   Sat Jun 10, 2024 12:01 AM) Pt taking  60 mg  lithium  carbonate 150 MG capsule [id number]  Take 150 mg by mouth 2 (two) times daily. [provider]  Active Self  metFORMIN  (GLUCOPHAGE -XR) 500 MG 24 hr tablet [id number]  TAKE 1 TABLET BY MOUTH DAILY WITH EVENING MEAL [name], MD  Active   methocarbamol  (ROBAXIN ) 500 MG tablet [id number]  TAKE 1 TABLET BY MOUTH EVERY 8 HOURS AS NEEDED FOR MUSCLE SPASMS [name], MD  Active Self  metoprolol  succinate (TOPROL -XL) 25 MG 24 hr tablet [id number]  Take 1 tablet (25 mg total) by mouth daily. Alexander, Natalie, DO  Active   nitroGLYCERIN  (NITROSTAT ) 0.4 MG SL tablet [id number]  Place 1 tablet (0.4 mg total)  under the tongue every 5 (five) minutes as needed for chest pain (Do not take if blood pressure is low, systolic BP<110). [name], MD  Active Self           Med Note HALLIE, [facility] N   Tue Mar 21, 2024 12:58 PM) Needs new order  omega-3 acid ethyl esters (LOVAZA ) 1 g capsule [id number]  TAKE 4 CAPSULES (4 GRAMS TOTAL) BY WARDEN [name], MD  Active Self  ondansetron  (ZOFRAN ) 4 MG tablet [id number]  Take 1 tablet (4 mg total) by mouth every 8 (eight) hours as needed for nausea or vomiting. [name], MD  Active   oxyCODONE -acetaminophen  (PERCOCET) 10-325 MG tablet [id number]  TAKE 1 TABLET BY MOUTH EVERY 4 HOURS AS NEEDED FOR PAIN [name], MD  Active   OXYGEN  [id number]  Inhale 2 L into the lungs at bedtime as needed (for shortness of breath). [provider]  Active Self  pantoprazole  (PROTONIX ) 40 MG tablet [id number]  TAKE 1 TABLET BY MOUTH 2 TIMES DAILY (30MINUTES BEFORE A MEAL) [name], MD  Active Self  ranolazine  (RANEXA ) 1000 MG SR tablet [id number]  Take 1 tablet (1,000 mg total) by mouth 2 (two) times daily. Darci Pore, MD  Active Self  sacubitril -valsartan  (ENTRESTO ) 24-26 MG [id number]  Take 1 tablet by mouth 2 (two) times daily. [provider]  Active Self  sennosides-docusate sodium  (SENOKOT-S) 8.6-50 MG tablet [id number]  Take 1 tablet by mouth daily as needed for constipation. [name], MD  Active Self  spironolactone  (ALDACTONE ) 25 MG tablet [id number]  Take 1 tablet (25 mg total) by mouth daily. [name], MD  Active Self  traZODone  (DESYREL ) 150 MG tablet [id number]  Take 1 tablet (150 mg total) by mouth at bedtime as needed for sleep. [name], MD  Active Self  TRINTELLIX  10 MG TABS tablet [id number]  Take 10 mg by mouth daily. [provider]  Active Self  Med List Note [name], Richerd GRADE, RN 06/29/24 1843): 06/16/24 Reviewed with grandson, [name] with patient's permission today 06/29/24  Medications reviewed with grandson, [name] who assist in managing patient's medications, Patient's medications list from 06/23/24 AVS reviewed and patient to restart Imdur  and Metoprolol . Patient didn't have Metoprolol . He is to check cabinet where his paused medications were.            Recommendation:   PCP Follow-up on August 11, 2024 Continue Current Plan of Care  Follow Up Plan:   Telephone follow up appointment with Nurse Case Manager on August 16, 2024   [facility] RN Care Manager Direct Dial: [phone]  Fax: [phone] Website: [email or web address]

## 2024-07-20 NOTE — Telephone Encounter (Signed)
 I don't prescribe or manage either of those medications, it's beyond my scope of practice. I can only provide emergency refills until he establishes with another psychiatrist.

## 2024-07-20 NOTE — Telephone Encounter (Signed)
**Note De-identified  [place] Obfuscation** Please advise 

## 2024-07-20 NOTE — Telephone Encounter (Signed)
 Copied from CRM [id number]. Topic: Clinical - Medication Question >> Jul 20, 2024  1:32 PM Charlet HERO wrote: Reason for CRM: [facility] Team nurse Bobbette  is calling about lithium  carb 100 mg  centrilix 10mg  the patients provider is no longer with [facility] and would like to know if Dr Gasper would refill the meds until a new provider is found for him

## 2024-07-24 NOTE — Patient Instructions (Signed)
 Thank you for allowing the Complex Care Management team to participate in your care. It was great speaking with you!  We will follow up on August 16, 2024 at 2 pm. Please do not hesitate to contact me if you require assistance prior to our next outreach.   Jackson [facility]ron [facility] RN Care Manager Direct Dial: [phone]  Fax: [phone] Website: [email or web address]

## 2024-07-26 ENCOUNTER — Ambulatory Visit

## 2024-07-26 ENCOUNTER — Ambulatory Visit (INDEPENDENT_AMBULATORY_CARE_PROVIDER_SITE_OTHER)

## 2024-07-26 VITALS — BP 159/82 | HR 105

## 2024-07-26 DIAGNOSIS — I5042 Chronic combined systolic (congestive) and diastolic (congestive) heart failure: Secondary | ICD-10-CM

## 2024-07-26 DIAGNOSIS — I10 Essential (primary) hypertension: Secondary | ICD-10-CM

## 2024-07-26 NOTE — Progress Notes (Unsigned)
 S:     Chief Complaint  Patient presents with   Medication Management   68 y.o. male who presents for hypertension evaluation, education, and management.   Patient was referred and last seen by Primary Care Provider, Dr. Gasper, on 06/23/24.   Patient was seen in the ED for chest pain and LEE at both [facility] and [facility] 15+ times in the past 6 months. Patient was instructed was discharged with instructions to follow up with cardiologist.  He was admitted to [facility] on 7/25-7/29/25 for a syncopal episode. GDMT was held at that time, namely Entresto , MRA, and Lasix . He was admitted to [facility] on 06/17/24 with chest pain. ACS ruled out. BB held d/t bradycardia.    At last visit with PCP on 06/23/24, he reported accidentally taking furosmide and experiencing n/v of green material. BP improved at last visit and patient was restarted on Entresto  24/26 mg BID and spironolactone  25 mg daily.    At recent ED visit on 07/12/24, metoprolol  succinate 25 mg daily was restarted.  PMH is significant for T2DM, BPH, HLD, depression, anxiety, COPD, OSA, PAD, paroxysmal A-fib, CAD s/p CABG x 2, and HFimpEF.   Today, patient arrives in good spirits and presents with a walker. Reports issues with dizziness at baseline as well as chest pain. Reports a slight headache today. Denies jaw pain or n/v.    Patient also reports right calf pain. Denies any change in pain with activity.   Medication adherence good . Patient reports taking blood pressure medications today.   Current antihypertensives include: Entresto  24/26 mg BID, Toprol  XL 25 mg daily, spironolactone  25 mg daily  Reported home blood pressure readings: 140-190/90-110 *of note, have one BP reading with a systolic of 149 mmHg last month. Patient reported calling paramedic who verified that BP reading was inaccurate. BP cuff has since stopped worked.   Patient-reported exercise habits: no formal exercise; mainly walks around the house  O:   Physical  exam: -right calf: not warm to the touch, no redness.   Last 3 Office BP readings: BP Readings from Last 3 Encounters:  07/26/24 (!) 159/82  07/17/24 (!) 149/98  07/08/24 (!) 177/105    BMET    Component Value Date/Time   NA 140 07/17/2024 1908   NA 142 07/03/2024 1432   NA 143 03/07/2015 1851   K 4.2 07/17/2024 1908   K 3.9 03/07/2015 1851   CL 103 07/17/2024 1908   CL 107 03/07/2015 1851   CO2 29 07/17/2024 1908   CO2 29 03/07/2015 1851   GLUCOSE 114 (H) 07/17/2024 1908   GLUCOSE 99 03/07/2015 1851   BUN 21 07/17/2024 1908   BUN 12 07/03/2024 1432   BUN 9 03/07/2015 1851   CREATININE 1.14 07/17/2024 1908   CREATININE 0.84 03/07/2015 1851   CALCIUM  9.6 07/17/2024 1908   CALCIUM  9.6 03/07/2015 1851   GFRNONAA >60 07/17/2024 1908   GFRNONAA >60 03/07/2015 1851   GFRAA 97 09/24/2020 1105   GFRAA >60 03/07/2015 1851    Renal function: Estimated Creatinine Clearance: 62 mL/min (by C-G formula based on SCr of 1.14 mg/dL).  Clinical ASCVD: Yes  The ASCVD Risk score (Arnett DK, et al., 2019) failed to calculate for the following reasons:   Risk score cannot be calculated because patient has a medical history suggesting prior/existing ASCVD  A/P: Hypertension diagnosis currently uncontrolled on current medications. BP goal < 130/80 mmHg. Medication adherence appears appropriate. Home BP readings have been elevated, but patient reports  cuff stopped working and was likely reporting inaccurate readings. However, BP in clinic was elevated today and has been for the past ED encounters. Will adjust RAAS agent at this time.  -Increased dose of Entresto  to 49/51 mg BID. -Continue metoprolol  succinate 25 mg daily -Continued  furosemide  20 mg daily -Continued spironolactone  25 mg daily -Counseled on lifestyle modifications for blood pressure control including reduced dietary sodium, increased exercise, adequate sleep. -Encouraged patient to check BP at home and bring log of readings  to next visit. Counseled on proper use of home BP cuff. Will send new prescription for BP cuff. Print and bring to [facility]. Patient to bring cuff to follow up appointment to verify accuracy.  -Discussed for patient to present to ED if chest pain worsens or with any vision changes or new symptoms. Counseled for patient to present to ED if leg pain worsens or any new symptoms present (I.e. redness, warmth, etc)   MISC: Patient is looking to establish mental health care with a new provider as his previous one is no longer practicing. He reached out to the [facility] psychiatrist office in [place], but has not heard back yet. Provided number for the [facility] in [place].  Results reviewed and written information provided.    Written patient instructions provided. Patient verbalized understanding of treatment plan.  Total time in face to face counseling 35 minutes.    Follow-up:  Cardiology: 07/28/24 Pharmacist 08/24/24. PCP clinic visit in 08/11/24.   Ree Alcalde E. Marsh, PharmD Clinical Pharmacist [facility] [phone]

## 2024-07-27 ENCOUNTER — Telehealth: Payer: Self-pay | Admitting: Family

## 2024-07-27 MED ORDER — SACUBITRIL-VALSARTAN 49-51 MG PO TABS: 1.0000 | ORAL_TABLET | Freq: Two times a day (BID) | ORAL | 1 refills | Status: DC

## 2024-07-27 MED ORDER — BLOOD PRESSURE MONITOR DEVI: 0 refills | Status: AC

## 2024-07-27 NOTE — Telephone Encounter (Signed)
 Called to confirm/remind patient of their appointment [facility] on 07/28/24.   Appointment:   [x] Confirmed  [] Left mess   [] No answer/No voice mail  [] VM Full/unable to leave message  [] Phone not in service  Patient reminded to bring all medications and/or complete list.  Confirmed patient has transportation. Gave directions, instructed to utilize valet parking.

## 2024-07-27 NOTE — ED Provider Notes (Signed)
 [facility] Emergency Department Provider Note   ED Clinical Impression   Final diagnoses:  None     Impression, Medical Decision Making, ED Course   Impression: 68 y.o. male w/ PMH of [place], HLD, T2DM, CAD (s/p CABG x2), HFmrEF, A-Fib (on Eliquis ), COPD, chronic bronchitis, osteoarthritis, asthma, DDD, diverticulosis, and GERD  who presents with chest pain as described below.  DDx/MDM:   This patient's most likely diagnosis is likely unstable angina vs CHF exacerbation given lack of improvement after 3 doses of nitroglycerin  at home in setting of COPD exacerbation. Potentially life threatening diagnoses necessary to rule out include ACS, tachyarrhythmia or other dangerous arrhythmias, pulmonary embolism, aortic dissection. Other diagnoses to consider include GERD. Patient takes famotidine  at home.  Patient's ProBNP 2k is slightly above baseline which is typically ~1k.  Will treat patient with Lasix , pain management.  More detailed discussion of differential diagnoses has been generated below based on HPI interview, discussion with patient of possible diagnoses and next steps, and clinical decision-making shared with patient.  Medical Decision Making A 68 year old male with a history of coronary artery disease (status post CABG and stents), COPD with emphysema and chronic bronchitis, presented with four days of chest pain similar to prior cardiac episodes, new bilateral leg swelling, increased wheezing, and new-onset headache and nausea. He reported nitroglycerin  was ineffective for his chest pain, and his home oxygen  requirement is 2L. Exam revealed increased wheezing and elevated blood pressure (180/125), with new lower extremity edema. He also endorsed mild cough and no prior history of headaches.  Differential diagnosis includes, but is not limited to: - Acute Coronary Syndrome (ACS) / Cardiac Ischemia: Given the patient's history of CAD, CABG, and stents, and chest pain similar to  prior ischemic episodes unrelieved by nitroglycerin , ACS is a primary concern and a cardiac workup was planned. - Acute Exacerbation of COPD (possibly infectious): Increased wheezing, cough, and shortness of breath in a patient with COPD and emphysema suggest an acute exacerbation, possibly triggered by infection. - Heart Failure Exacerbation: New bilateral lower extremity swelling and history of reduced ejection fraction raise concern for heart failure exacerbation, potentially precipitated by infection or cardiac ischemia. - Hypertensive Urgency: Markedly elevated blood pressure above baseline with associated symptoms (headache, nausea) suggests hypertensive urgency, possibly secondary to underlying cardiac or pulmonary stress.  Acute Coronary Syndrome / Heart Failure Exacerbation - Ordered cardiac workup to rule out myocardial infarction - Ordered evaluation for potential cardiac or renal causes of swelling - Administered pain relief for chest pain  Acute Exacerbation of COPD (possibly infectious) - Administered nebulized bronchodilators to open airways - Ordered evaluation for possible infection contributing to exacerbation  Hypertensive Urgency - Ensured follow-up with primary care for blood pressure management   Orders Placed This Encounter  Procedures  . Influenza/ RSV/COVID PCR  . XR Chest 2 views  . CBC w/ Differential  . Comprehensive Metabolic Panel  . PT-INR  . hsTroponin I (serial 0-2-6H w/ delta)  . Pro-BNP  . hsTroponin I - 2 Hour  . hsTroponin I - 6 Hour  . Lipase  . Magnesium   . ECG 12 Lead  . ECG 12 Lead  . ECG 12 Lead    ED Course as of 07/28/24 1941  Thu Jul 27, 2024  2043 [ ]  Repeat EKG  2046 [ ]  Obtain third troponin, reassess    Independent Interpretation of Studies: I have independently interpreted the following studies: As above. See ED course for interpretation of studies by resident under  supervision of attending provider attested  separately.  Discussion of Management With Other Providers or Support Staff: I discussed the management of this patient with the: Patient was discussed with provider with attestation signed separately on duty on this date.  Attending agrees with admission.  Considerations Regarding Disposition/Escalation of Care and Critical Care: Patient does not require escalation of care at this time.  ____________________________________________  The case was discussed with the attending physician, who is in agreement with the above assessment and plan.    History   Chief Complaint Chief Complaint  Patient presents with  . Chest Pain    HPI   History of Present Illness Lawrence [name] is a 68 year old male with coronary artery disease and COPD who presents with chest pain.  He has been experiencing chest pain for four days, described as similar to previous episodes related to his history of myocardial infarction and stent placement. The pain is severe, rated eight out of ten, and unrelieved by nitroglycerin , which he took three times at five-minute intervals.  In addition to chest pain, he has new bilateral leg swelling that began four days ago. He also has nausea and a headache, which started a week ago. The headache is located in the middle of his head and is unusual for him.  He has a history of COPD, emphysema, and chronic bronchitis, for which he uses a Breztri  inhaler. He reports increased wheezing and shortness of breath, more severe than usual, and is on two liters of oxygen  at home. He has had previous COPD exacerbations requiring antibiotics.  His current medications include isosorbide  mononitrate and a blood pressure medication, both of which have had recent changes recommended by his primary doctor. He also takes medications for platelet management. His blood pressure at home is usually around 160/100.   Outside Historian(s): I have obtained additional history/collateral from a  friend or family member if present at bedside at time of interview.  External Records Reviewed: I have reviewed external records from outpatient encounters.  Past Medical History[1]  Past Surgical History[2]  No current facility-administered medications for this encounter.  Current Outpatient Medications:  .  albuterol  2.5 mg /3 mL (0.083 %) nebulizer solution, Inhale 3 mL (2.5 mg total) every six (6) hours as needed., Disp: , Rfl:  .  allopurinoL  (ZYLOPRIM ) 300 MG tablet, Take 1 tablet (300 mg total) by mouth two (2) times a day. (Patient not taking: Reported on 07/05/2024), Disp: , Rfl:  .  ALPRAZolam  (XANAX ) 1 MG tablet, Take 1 tablet (1 mg total) by mouth Three (3) times a day as needed for sleep., Disp: , Rfl:  .  apixaban  (ELIQUIS ) 5 mg Tab, Take 1 tablet (5 mg total) by mouth two (2) times a day., Disp: 90 tablet, Rfl: 3 .  atorvastatin  (LIPITOR ) 80 MG tablet, Take 1 tablet (80 mg total) by mouth every evening., Disp: 90 tablet, Rfl: 3 .  budesonide -glycopyr-formoterol  (BREZTRI  AEROSPHERE) 160-9-4.8 mcg/actuation inhaler, Inhale 2 puffs by mouth two times daily, Disp: 10.7 g, Rfl: 0 .  cetirizine  (ZYRTEC ) 10 MG tablet, Take 1 tablet (10 mg total) by mouth daily., Disp: , Rfl:  .  cholecalciferol , vitamin D3, (VITAMIN D3) 125 mcg (5,000 unit) tablet, Take 1 tablet (125 mcg total) by mouth daily. (Patient not taking: Reported on 07/05/2024), Disp: , Rfl:  .  dapagliflozin  (FARXIGA ) 10 mg Tab tablet, Take 1 tablet (10 mg total) by mouth every morning., Disp: , Rfl:  .  furosemide  (LASIX ) 20 MG  tablet, Take 1 tablet (20 mg total) by mouth daily as needed for other (edema)., Disp: 30 tablet, Rfl: 0 .  lidocaine  (ASPERCREME) 4 % patch, Place 1 patch on the skin daily., Disp: 10 patch, Rfl: 0 .  lithium  150 MG capsule, Take 1 capsule (150 mg total) by mouth nightly., Disp: , Rfl:  .  metFORMIN  (GLUCOPHAGE -XR) 500 MG 24 hr tablet, Take 1 tablet (500 mg total) by mouth daily before breakfast.,  Disp: 90 tablet, Rfl: 0 .  methocarbamoL  (ROBAXIN ) 500 MG tablet, Take 1 tablet (500 mg total) by mouth every eight (8) hours as needed., Disp: , Rfl:  .  metoPROLOL  succinate (TOPROL -XL) 25 MG 24 hr tablet, Take 1 tablet (25 mg total) by mouth daily for 20 days., Disp: 20 tablet, Rfl: 0 .  nitroglycerin  (NITROSTAT ) 0.4 MG SL tablet, Place 1 tablet (0.4 mg total) under the tongue every five (5) minutes as needed for chest pain. Maximum of 3 doses in 15 minutes., Disp: , Rfl:  .  omega-3 acid ethyl esters (LOVAZA ) 1 gram capsule, Take 4 capsules (4 g total) by mouth in the morning., Disp: , Rfl:  .  oxyCODONE -acetaminophen  (PERCOCET) 10-325 mg per tablet, Take 1 tablet by mouth every four (4) hours as needed for pain., Disp: , Rfl:  .  OXYGEN -AIR DELIVERY SYSTEMS MISC, Inhale 2 L nightly., Disp: , Rfl:  .  pantoprazole  (PROTONIX ) 40 MG tablet, Take 1 tablet (40 mg total) by mouth daily before breakfast., Disp: 90 tablet, Rfl: 3 .  polyethylene glycol (GLYCOLAX ) 17 gram/dose powder, Dissolve one capful (17 grams) in 4 to 8 oz of fluid and drink by mouth two (2) times a day as needed (Constipation)., Disp: 510 g, Rfl: 2 .  ranolazine  (RANEXA ) 500 MG 12 hr tablet, Take 2 tablets (1,000 mg total) by mouth two (2) times a day., Disp: 60 tablet, Rfl: 0 .  traZODone  (DESYREL ) 100 MG tablet, Take 1 tablet (100 mg total) by mouth nightly., Disp: , Rfl:  .  vortioxetine  (TRINTELLIX ) 10 mg tablet, Take 1 tablet (10 mg total) by mouth nightly., Disp: , Rfl:   Allergies Meperidine, Prednisone , Sulfasalazine, Sulfa (sulfonamide antibiotics), Albuterol , Albuterol  sulfate, Empagliflozin , and Morphine   Family History Family History[3]  Social History Short Social History[4]   Physical Exam   VITAL SIGNS:    Vitals:   07/27/24 2200 07/27/24 2230 07/28/24 0025 07/28/24 0213  BP: 168/106  161/81 164/80  Pulse: 87 86 107 108  Resp: 10 17  16   Temp:    36.7 C (98.1 F)  TempSrc:    Oral  SpO2: 93% 91%   92%  Weight:        Constitutional: Alert and oriented. No acute distress. Eyes: Conjunctivae are normal. HEENT: Normocephalic and atraumatic. Conjunctivae clear. No congestion. Moist mucous membranes.  Cardiovascular: Rate as above, regular rhythm. Normal and symmetric distal pulses. Brisk capillary refill. Normal skin turgor. Respiratory: Normal respiratory effort. Wheezing present in all lung fields. Gastrointestinal: Soft, non-distended, non-tender. Genitourinary: Deferred. Musculoskeletal: Non-tender with normal range of motion in all extremities. Neurologic: Normal speech and language. No gross focal neurologic deficits are appreciated. Patient is moving all extremities equally, face is symmetric at rest and with speech. Skin: Skin is warm, dry and intact. No rash noted. Psychiatric: Mood and affect are normal. Speech and behavior are normal.   Radiology   XR Chest 2 views  Final Result    Prominent interstitial markings may reflect mild pulmonary edema.    Unchanged  cardiomediastinal contour status post median sternotomy.        Pertinent labs & imaging results that were available during my care of the patient were independently interpreted by me and considered in my medical decision making (see chart for details).  Portions of this record have been created using Scientist, clinical (histocompatibility and immunogenetics). Dictation errors have been sought, but may not have been identified and corrected.  Rodrick Sills, MD [facility] Emergency Medicine, PGY-1       [1] Past Medical History: Diagnosis Date  . CAD (coronary artery disease)    s/p anterior STEMI (95% LAD) on 04/27/01. Stent x2 to LAD; S/p iatrogenic pLAD dissection 04/27/01 -> CABG x2 (IMA->LAD, SVG->OM1+2) on 04/27/01 ; In 11/13, catheterization revealed 70% OM1 lesion -> PCI with Xience stent.  . Chronic back pain   . COPD (chronic obstructive pulmonary disease)    (CMS-HCC)   . DM (diabetes mellitus)    (CMS-HCC)   . Enlarged prostate    . Fatty liver   . GERD (gastroesophageal reflux disease)   . HLD (hyperlipidemia)   . Hypertension   . Myocardial infarction    (CMS-HCC)   . OSA (obstructive sleep apnea)   . Pancreatitis (HHS-HCC)    last episode 2013 per pt  [2] Past Surgical History: Procedure Laterality Date  . ARTERIAL BYPASS SURGERY    . CARDIAC CATHETERIZATION    . CARDIAC SURGERY    . CHOLECYSTECTOMY    . CORONARY STENT PLACEMENT    . PR CATH PLACE/CORON ANGIO, IMG SUPER/INTERP,W LEFT HEART VENTRICULOGRAPHY N/A 08/21/2015   Procedure: Left Heart Catheterization W Intervention;  Surgeon: Zachary Prentice Car, MD;  Location: [facility] CATH;  Service: Cardiology  . PR CATH PLACE/CORON ANGIO, IMG SUPER/INTERP,W LEFT HEART VENTRICULOGRAPHY N/A 06/09/2018   Procedure: Left Heart Catheterization Coronary Angiography Bypass graft angiography;  Surgeon: Donnice Beverley Chester, MD;  Location: [facility] CATH;  Service: Cardiology  . PR CATH PLACE/CORON ANGIO, IMG SUPER/INTERP,W LEFT HEART VENTRICULOGRAPHY N/A 08/17/2023   Procedure: Left Heart Catheterization;  Surgeon: Gil [name] Prentice, MD;  Location: [facility] CATH;  Service: Cardiology  . PR UPPER GI ENDOSCOPY,DIAGNOSIS N/A 07/05/2024   Procedure: UGI ENDO, INCLUDE ESOPHAGUS, STOMACH, & DUODENUM &/OR JEJUNUM; DX W/WO COLLECTION SPECIMN, BY BRUSH OR WASH;  Surgeon: Brenita Vannie BIRCH, MD;  [facility];  Service: Gastroenterology  . TONSILLECTOMY AND ADENOIDECTOMY    [3] Family History Problem Relation Age of Onset  . Heart disease Maternal Grandfather   . Heart attack Maternal Grandfather   . Heart disease Paternal Grandfather   . Heart attack Paternal Grandfather   . Parkinsonism Paternal Grandfather   . Heart disease Mother   . COPD Mother   . Heart disease Father   . Cirrhosis Father   . Hypertension Father   . COPD Father   . Parkinsonism Brother   [4] Social History Tobacco Use  . Smoking status: Former    Current packs/day: 0.00     Average packs/day: 1 pack/day for 50.0 years (50.0 ttl pk-yrs)    Types: Cigarettes    Start date: 05/03/1963    Quit date: 05/02/2013    Years since quitting: 11.2  . Smokeless tobacco: Never  . Tobacco comments:    Pt states he used to smoke 3.5ppd for about 50 years, but quit 10 years ago.  Vaping Use  . Vaping status: Never Used  Substance Use Topics  . Alcohol use: No    Alcohol/week: 0.0 standard drinks of alcohol  .  Drug use: Not Currently   Woodrow Rodrick BROCKS, MD Resident 07/28/24 [phone]

## 2024-07-28 ENCOUNTER — Ambulatory Visit: Attending: Family | Admitting: Family

## 2024-07-28 VITALS — BP 188/107 | HR 82 | Wt 186.0 lb

## 2024-07-28 DIAGNOSIS — F419 Anxiety disorder, unspecified: Secondary | ICD-10-CM | POA: Diagnosis not present

## 2024-07-28 DIAGNOSIS — Z7902 Long term (current) use of antithrombotics/antiplatelets: Secondary | ICD-10-CM | POA: Diagnosis not present

## 2024-07-28 DIAGNOSIS — K5730 Diverticulosis of large intestine without perforation or abscess without bleeding: Secondary | ICD-10-CM | POA: Insufficient documentation

## 2024-07-28 DIAGNOSIS — I2510 Atherosclerotic heart disease of native coronary artery without angina pectoris: Secondary | ICD-10-CM | POA: Insufficient documentation

## 2024-07-28 DIAGNOSIS — N189 Chronic kidney disease, unspecified: Secondary | ICD-10-CM | POA: Insufficient documentation

## 2024-07-28 DIAGNOSIS — J4489 Other specified chronic obstructive pulmonary disease: Secondary | ICD-10-CM | POA: Diagnosis not present

## 2024-07-28 DIAGNOSIS — M1990 Unspecified osteoarthritis, unspecified site: Secondary | ICD-10-CM | POA: Diagnosis not present

## 2024-07-28 DIAGNOSIS — Z955 Presence of coronary angioplasty implant and graft: Secondary | ICD-10-CM | POA: Insufficient documentation

## 2024-07-28 DIAGNOSIS — E785 Hyperlipidemia, unspecified: Secondary | ICD-10-CM | POA: Insufficient documentation

## 2024-07-28 DIAGNOSIS — I5022 Chronic systolic (congestive) heart failure: Secondary | ICD-10-CM | POA: Diagnosis not present

## 2024-07-28 DIAGNOSIS — Z951 Presence of aortocoronary bypass graft: Secondary | ICD-10-CM | POA: Insufficient documentation

## 2024-07-28 DIAGNOSIS — I130 Hypertensive heart and chronic kidney disease with heart failure and stage 1 through stage 4 chronic kidney disease, or unspecified chronic kidney disease: Secondary | ICD-10-CM | POA: Insufficient documentation

## 2024-07-28 DIAGNOSIS — I10 Essential (primary) hypertension: Secondary | ICD-10-CM

## 2024-07-28 DIAGNOSIS — M51369 Other intervertebral disc degeneration, lumbar region without mention of lumbar back pain or lower extremity pain: Secondary | ICD-10-CM | POA: Diagnosis not present

## 2024-07-28 DIAGNOSIS — I25118 Atherosclerotic heart disease of native coronary artery with other forms of angina pectoris: Secondary | ICD-10-CM

## 2024-07-28 DIAGNOSIS — I252 Old myocardial infarction: Secondary | ICD-10-CM | POA: Insufficient documentation

## 2024-07-28 DIAGNOSIS — R0602 Shortness of breath: Secondary | ICD-10-CM | POA: Diagnosis present

## 2024-07-28 DIAGNOSIS — Z87891 Personal history of nicotine dependence: Secondary | ICD-10-CM | POA: Insufficient documentation

## 2024-07-28 DIAGNOSIS — E1122 Type 2 diabetes mellitus with diabetic chronic kidney disease: Secondary | ICD-10-CM | POA: Diagnosis not present

## 2024-07-28 DIAGNOSIS — E782 Mixed hyperlipidemia: Secondary | ICD-10-CM

## 2024-07-28 DIAGNOSIS — K219 Gastro-esophageal reflux disease without esophagitis: Secondary | ICD-10-CM | POA: Diagnosis not present

## 2024-07-28 DIAGNOSIS — G4733 Obstructive sleep apnea (adult) (pediatric): Secondary | ICD-10-CM | POA: Insufficient documentation

## 2024-07-28 DIAGNOSIS — I480 Paroxysmal atrial fibrillation: Secondary | ICD-10-CM

## 2024-07-28 DIAGNOSIS — E1121 Type 2 diabetes mellitus with diabetic nephropathy: Secondary | ICD-10-CM

## 2024-07-28 MED ORDER — SPIRONOLACTONE 25 MG PO TABS: 12.5000 mg | ORAL_TABLET | Freq: Every day | ORAL | 3 refills | Status: DC

## 2024-07-28 NOTE — Progress Notes (Signed)
 Advanced Heart Failure Clinic Note    PCP: [name], MD  Cardiologist: [facility] cardiology (last seen 06/25)  Chief Complaint: shortness of breath   HPI:  Lawrence [name] is a 68 y/o male with a history of paroxysmal atrial fibrillation, coronary artery disease s/p CABG X2 with LIMA to the LAD and SVG to OM (2002); procedure was complicated by coronary artery dissection, asthma, osteoarthritis, hyperlipidemia, GERD, anxiety, DDD, diverticulosis, COPD, CKD essential hypertension and PSVT, type II diabetes mellitus, OSA & chronic heart failure. S/p PCI/DES to mid LAD with subsequent PTCA (2016), proximal to mid LAD stents (2014), distal LAD stent (unknown date), stent in the ostium SVG (unknown date) & circumflex/OM 1 bifucation stent (2013).   Has had 11 caths since 2014  Echo 05/01/23: EF 50-55% with mild LVH and mild/ moderate LAE  Was in the ED 05/14/23 due to 20 episodes of diarrhea. Abdominal CT negative and negative for e coli. Antibiotics provided.   Admitted 05/16/23 due to acute onset of midsternal and left-sided chest pain felt as tightness and graded 9/10 in severity with no radiation however with associated nausea as well as dyspnea and palpitations. Per Dr. Ammon, likely that one of the patient's bypass grafts has occluded with his recent NSTEMI (per his review of films, unlikely to be amenable to an intervention. IV diuresed. Has had 7 ED visits July 2024, 2 ED visits Aug 2024.   Echo 06/10/24: EF 45-50%, mild LVH, G1DD  Admitted 07/11/23 due to acute onset of midsternal chest pain, nausea and diaphoresis. He described the sensation as an elephant sitting on his chest. He also complained of cough productive of yellowish sputum and wheezing. Was recently treated for covid for which he was given molnupiravir  & completed 07/13/23. Chest pain, mildly elevated but flat troponins: He was seen by the cardiologist. Chest pain thought to be noncardiac in etiology. Cardiology consult done.     Since seen in [facility] 09/24, he has been admitted 12 times and been in the ED 48 times. Majority of times has been for chest pain.   Seen in [facility] 07/03/24 where midodrine  was decreased for 3 days and then was to be stopped due to elevated BP.   Seen in ED 07/07/24 with HA and elevated BP. CT head without acute bleed. EKG and troponin x 2 minimally elevated, stable compared to priors.   Seen in ED 07/12/24 with chest pain. Troponins stable. Had an episode of slow Afib to 30s, with dec in BP, worsening chest pain, weakness, feeling of doom. Given metoprolol  25mg  daily.   Seen in ED 07/17/24 with chest pain X 2 days. Normal CBC, BMET, troponins chronically elevated. EKG without acute ischemic changes. Symptoms improved and discharged.  Seen in ED 07/27/24 with chest pain. Found to have HF exacerbation and extra lasix  given.     He presents today for a HF f/u visit with a chief complaint of shortness of breath. Has associated fatigue, pedal edema (worsening), chest pain, palpitations, dizziness, frontal headache, not sleeping well due to headache. Has had a few ER visits with chest pain. Has [facility] cardiology appointment scheduled next week. He shows me his med list and does not have entresto / spironolactone  on it although he says that he's pretty sure he's taking entresto  and PCP just increased the dose. He is picking up the new dose of entresto  later today. Says that he's only taking his lasix  as needed and hasn't taken it recently.    Previous cardiac  studies:  Echo 06/26/15 EF 60-65% along with mild LVH and trivial AR.  Echo 04/19/18: EF 50-55% Echo 03/08/20: EF 45-50% along with mild Lawrence Echo 11/02/22: EF 50-55% with mild LVH, moderate LAE  LHC 11/02/22:   Prox LAD to Mid LAD lesion is 30% stenosed.   Mid LAD lesion is 40% stenosed.   Prox RCA lesion is 15% stenosed.   Dist RCA lesion is 25% stenosed.   1st Mrg lesion is 70% stenosed.   RPAV-2 lesion is 60% stenosed.   RPAV-1 lesion is  85% stenosed with 85% stenosed side branch in 1st RPL.   RPDA-1 lesion is 90% stenosed.   RPDA-2 lesion is 85% stenosed.   Non-stenotic Mid LAD to Dist LAD lesion was previously treated.   Non-stenotic Prox Cx to Mid Cx lesion was previously treated.   Non-stenotic Origin to Prox Graft lesion was previously treated.   LIMA and is small.   There is mild left ventricular systolic dysfunction.   LV end diastolic pressure is mildly elevated.   The left ventricular ejection fraction is 45-50% by visual estimate.  Plan LHC from right  groin because of his persistent anginal symptoms.  Chronic angina Left ventriculogram shows mildly dilated LV with apical hypokinesis EF around 45-50 Coronaries Left main mild irregularities LAD mild to moderate disease.  Diagonal 1 Circumflex large minor irregularities occluded OM1 RCA large minor irregularities moderate disease TIMI-3 flow right dominant No significant collaterals  Grafts LIMA to mid LAD atretic SVG to OM1 widely patent  Patient cath essentially improved from previous procedure done 04/2021.  No areas of significant obstructive disease requiring intervention. Recommend aggressive medical therapy for chronic chest pain appears to mostly be noncardiac   ROS: All systems negative except as listed in HPI, PMH and Problem List.  SH:  Social History   Socioeconomic History   Marital status: Widowed    Spouse name: Not on file   Number of children: 1   Years of education: Not on file   Highest education level: 10th grade  Occupational History   Occupation: Disabled   Occupation: on Tree surgeon  Tobacco Use   Smoking status: Former    Current packs/day: 0.00    Average packs/day: 3.0 packs/day for 50.0 years (150.0 ttl pk-yrs)    Types: Cigarettes    Start date: 04/23/1963    Quit date: 04/22/2013    Years since quitting: 11.2   Smokeless tobacco: Never   Tobacco comments:    Reports not smoking for approx 8 years.  Vaping Use    Vaping status: Never Used  Substance and Sexual Activity   Alcohol use: No    Comment: Hx Alcohol Abuse   Drug use: Not Currently    Types: Marijuana   Sexual activity: Not Currently  Other Topics Concern   Not on file  Social History Narrative   Pt lives in [place] with wife.  Does not routinely exercise.   Social Drivers of Corporate investment banker Strain: Low Risk  (06/19/2024)   Received from [facility]   Overall Financial Resource Strain (CARDIA)    How hard is it for you to pay for the very basics like food, housing, medical care, and heating?: Not very hard  Food Insecurity: Food Insecurity Present (06/16/2024)   Hunger Vital Sign    Worried About Running Out of Food in the Last Year: Sometimes true    Ran Out of Food in the Last Year: Never true  Transportation  Needs: No Transportation Needs (06/16/2024)   PRAPARE - Administrator, Civil Service (Medical): No    Lack of Transportation (Non-Medical): No  Physical Activity: Insufficiently Active (03/14/2024)   Exercise Vital Sign    Days of Exercise per Week: 3 days    Minutes of Exercise per Session: 20 min  Stress: No Stress Concern Present (03/14/2024)   [employer] of Occupational Health - Occupational Stress Questionnaire    Feeling of Stress : Only a little  Recent Concern: Stress - Stress Concern Present (01/13/2024)   [employer] of Occupational Health - Occupational Stress Questionnaire    Feeling of Stress : To some extent  Social Connections: Socially Isolated (06/10/2024)   Social Connection and Isolation Panel    Frequency of Communication with Friends and Family: More than three times a week    Frequency of Social Gatherings with Friends and Family: Once a week    Attends Religious Services: Never    Database administrator or Organizations: No    Attends Banker Meetings: Never    Marital Status: Widowed  Intimate Partner Violence: Not At Risk (06/16/2024)    Humiliation, Afraid, Rape, and Kick questionnaire    Fear of Current or Ex-Partner: No    Emotionally Abused: No    Physically Abused: No    Sexually Abused: No    FH:  Family History  Problem Relation Age of Onset   Heart attack Mother    Depression Mother    Heart disease Mother    COPD Mother    Hypertension Mother    Heart attack Father    Diabetes Father    Depression Father    Heart disease Father    Cirrhosis Father    Parkinson's disease Brother     Past Medical History:  Diagnosis Date   A-fib (HCC)    Anemia    Anginal pain (HCC)    Anxiety    Arthritis    Asthma    CAD (coronary artery disease)    a. 2002 CABGx2 (LIMA->LAD, VG->VG->OM1);  b. 09/2012 DES->OM;  c. 03/2015 PTCA of LAD ([facility]) in setting of atretic LIMA; d. 05/2015 Cath [facility]): nonobs dzs; e. 06/2015 Cath ([facility]): LM nl, LAD 45p/d ISR, 50d, D1/2 small, LCX 50p/d ISR, OM1 70ost, 30 ISR, VG->OM1 50ost, 107m, LIMA->LAD 99p/d - atretic, RCA dom, nl; f.cath 10/16: 40-50%(FFR 0.90) pLAD, 75% (FFR 0.77) mLAD s/p PCI/DES, oRCA 40% (FFR0.95)   Cancer (HCC)    SKIN CANCER ON BACK   Celiac disease    Chronic diastolic CHF (congestive heart failure) (HCC)    a. 06/2009 Echo: EF 60-65%, Gr 1 DD, triv AI, mildly dil LA, nl RV.   COPD (chronic obstructive pulmonary disease) (HCC)    a. Chronic bronchitis and emphysema.   DDD (degenerative disc disease), lumbar    Diverticulosis    Dysrhythmia    Essential hypertension    GERD (gastroesophageal reflux disease)    History of hiatal hernia    History of kidney stones    H/O   History of tobacco abuse    a. Quit 2014.   Myocardial infarction [facility]) 2002   4 STENTS   Pancreatitis    PSVT (paroxysmal supraventricular tachycardia) (HCC)    a. 10/2012 Noted on [facility].   Sleep apnea    LOST CORD TO CPAP -ONLY 02 @ BEDTIME   Tubular adenoma of colon    Type II diabetes mellitus (HCC)  Vitals:   07/28/24 1050  BP: (!) 188/107  Pulse: 82  SpO2: 95%  Weight:  186 lb (84.4 kg)   Wt Readings from Last 3 Encounters:  07/28/24 186 lb (84.4 kg)  07/20/24 182 lb (82.6 kg)  07/17/24 179 lb (81.2 kg)   Lab Results  Component Value Date   CREATININE 1.14 07/17/2024   CREATININE 0.93 07/07/2024   CREATININE 1.02 07/03/2024    PHYSICAL EXAM:  General: Disheveled appearing.  Cor: No JVD. Irregular rhythm, rate.  Lungs: expiratory wheezing throughout all lung fields Abdomen: soft, nontender, nondistended. Extremities: 1+ pitting edema bilateral lower legs Neuro:. Affect pleasant   ECG: not done   ASSESSMENT & PLAN:  1: Chronic ischemic heart failure with mildly reduced ejection fraction- - CABG in addition to PCI/DES to mid LAD with subsequent PTCA (2016), proximal to mid LAD stents (2014), distal LAD stent (unknown date), stent in the ostium SVG (unknown date) & circumflex/OM 1 bifucation stent (2013).  - NYHA class III - euvolemic - weight up 4 pounds from last visit here 3 weeks ago - Echo 06/26/15 EF 60-65% along with mild LVH and trivial AR.  - Echo 04/19/18: EF 50-55% - Echo 03/08/20: EF 45-50% along with mild Lawrence - Echo 11/02/22: EF 50-55% with mild LVH, moderate LAE - Echo 05/01/23: EF 50-55% with mild LVH and mild/ moderate LAE - Echo 06/10/24: EF 45-50%, mild LVH, G1DD - continue farxiga  10mg  daily - begin furosemide  20mg  daily, was taking it PRN - continue metoprolol  succinate 25mg  daily - begin entresto  49/51mg  BID (PCP already sent in new RX) - begin spironolactone  12.5mg  daily. BMET next week - jardiance  caused perineal pain - proBNP 07/27/24 was 2063.0  2: HTN- - BP  - saw PCP Jeralyn) 08/25 - BMP 07/27/24 reviewed: sodium 144, potassium 4.7, creatinine 0.84 & GFR >90  3: CAD- - saw [facility] cardiology 06/25 - continue isosorbide  MN 120mg  daily - continue plavix  75mg  daily - continue ranexa  1000mg  BID - LHC 11/02/22:   Prox LAD to Mid LAD lesion is 30% stenosed.   Mid LAD lesion is 40% stenosed.   Prox RCA lesion is 15%  stenosed.   Dist RCA lesion is 25% stenosed.   1st Mrg lesion is 70% stenosed.   RPAV-2 lesion is 60% stenosed.   RPAV-1 lesion is 85% stenosed with 85% stenosed side branch in 1st RPL.   RPDA-1 lesion is 90% stenosed.   RPDA-2 lesion is 85% stenosed.   Non-stenotic Mid LAD to Dist LAD lesion was previously treated.   Non-stenotic Prox Cx to Mid Cx lesion was previously treated.   Non-stenotic Origin to Prox Graft lesion was previously treated.   LIMA and is small.   There is mild left ventricular systolic dysfunction.   LV end diastolic pressure is mildly elevated.   The left ventricular ejection fraction is 45-50% by visual estimate.   4: Atrial fibrillation- - continue eliquis  5mg  BID - continue amiodarone  200mg  daily - irregular rhythm. EF too high for AICD.   5: DM- - A1c 02/01/24 was 5.8% - continue metformin   6: Hyperlipidemia- - LDL 10/19/23 was 80 with triglycerides of 156 - continue atorvastatin  80mg  daily   Return in 1 month, sooner if needed.   Ellouise Class, FNP-C 07/28/24

## 2024-07-28 NOTE — Patient Instructions (Signed)
 Medication Changes:  Start Spironolactone  12.5 MG once daily (1/2 tablet)   Take Lasix  everyday   Follow-Up in: 1 month with Ellouise Class, FNP.   Thank you for choosing [facility].    At the Advanced Heart Failure Clinic, you and your health needs are our priority. We have a designated team specialized in the treatment of Heart Failure. This Care Team includes your primary Heart Failure Specialized Cardiologist (physician), Advanced Practice Providers (APPs- Physician Assistants and Nurse Practitioners), and Pharmacist who all work together to provide you with the care you need, when you need it.   You may see any of the following providers on your designated Care Team at your next follow up:  Dr. Toribio Fuel Dr. Ezra Shuck Dr. Ria Commander Dr. Morene Brownie Ellouise Class, FNP Jaun Bash, RPH-CPP  Please be sure to bring in all your medications bottles to every appointment.   Need to Contact Us :  If you have any questions or concerns before your next appointment please send us  a message through [place] or call our office at [phone].    TO LEAVE A MESSAGE FOR THE NURSE SELECT OPTION 2, PLEASE LEAVE A MESSAGE INCLUDING: YOUR NAME DATE OF BIRTH CALL BACK NUMBER REASON FOR CALL**this is important as we prioritize the call backs  YOU WILL RECEIVE A CALL BACK THE SAME DAY AS LONG AS YOU CALL BEFORE 4:00 PM

## 2024-07-31 NOTE — Progress Notes (Signed)
 Cardiology Follow Up Patient Visit  PRIMARY CARE PROVIDER:   [name], MD [address] [place] [address]  Lawrence [name] 07/31/2024 DOB: 06-28-1956 Age: 68 y.o. [facility]    Chief Complaint  Patient presents with  . Hypertension    History of Present Illness  Lawrence [name] is a 68 y.o. male here for f/u of HF. He has a h/o CAD s/p CABG x2 2002 (LIMA-LAD, SVG-OM), PCI with DES to mLAD 2014 and 2016, stent to SVG ostium and Cx/OM bifurcation 2013, pAF, ICM and HFrEF. Last saw [facility] clinic on 07/28/24. This was following an ED visit on 07/27/24 for acute on chronic HF for which extra dose of lasix  was given. At HF clinic visit, farxiga  10 daily was continued, metoprolol  succinate 25 daily was continued. Mid dose entresto  was started along w/ spiro 12.5 daily. Continues to take lasix  20 mg po daily, eliquis  5 mg po bid, ASA 81 mg po daily, and ISMN 60 mg po daily. Has been doing ok overall, he says. No chest pressure. No bleeding. No PND or orthopnea.    Current Medications and Allergies   Allergies  Allergen Reactions  . Albuterol  Sulfate Other (See Comments)    Speeds heart rate up, but Xopenex  wasn't effective so MD changed to Albuterol  even though it causes palpitations.  . Demerol [Meperidine] Hives  . Empagliflozin  Other (See Comments)    Perineal pain  . Prednisone  Other (See Comments)    Pt states that this medication puts him in A-fib   . Sulfa (Sulfonamide Antibiotics) Hives  . Morphine  Sulfate Nausea And Vomiting, Other (See Comments) and Rash    Patient states he is allergic only to tablet form.    Current Outpatient Medications  Medication Sig Dispense Refill  . acetaminophen  (TYLENOL ) 325 MG tablet Take 650 mg by mouth every 4 (four) hours as needed for Pain    . allopurinoL  (ZYLOPRIM ) 300 MG tablet Take 300 mg by mouth 2 (two) times daily    . ALPRAZolam  (XANAX ) 1 MG tablet Take 1 mg by mouth 3 (three) times daily as needed for  Sleep.     . aspirin  81 MG chewable tablet Take 81 mg by mouth once daily      . atorvastatin  (LIPITOR ) 80 MG tablet Take 80 mg by mouth nightly.     . budesonide -glycopyrrolate -formoterol  (BREZTRI  AEROSPHERE) 160-9-4.8 mcg/actuation inhaler Inhale 2 inhalations into the lungs 2 (two) times daily    . cetirizine  (ZYRTEC ) 10 MG tablet Take 10 mg by mouth once daily.    . cholecalciferol  (VITAMIN D3) 1000 unit tablet Take 1,000 Units by mouth once daily    . cyanocobalamin  (VITAMIN B12) 1,000 mcg SL tablet Place 1 tablet under the tongue once daily    . famotidine  (PEPCID ) 20 MG tablet     . FARXIGA  10 mg tablet     . isosorbide  mononitrate (IMDUR ) 60 MG ER tablet Take 1 tablet (60 mg total) by mouth once daily Take 2 60mg  tablets once a day 60 tablet 0  . metFORMIN  (GLUCOPHAGE ) 500 MG tablet Take 500 mg by mouth 2 (two) times daily with meals      . methocarbamoL  (ROBAXIN ) 500 MG tablet Take 500 mg by mouth every 8 (eight) hours as needed    . metoprolol  SUCCinate (TOPROL -XL) 25 MG XL tablet Take 1 tablet (25 mg total) by mouth once daily    . nitroGLYcerin  (NITRODUR) 0.3 mg/hr patch Place 1 patch onto  the skin once daily Leave patch(es) on for 12-14 hours, then remove for 10-12 hours prior to applying the next patch. 30 patch 11  . omega-3 acid ethyl esters (LOVAZA ) 1 gram capsule Take 4 g by mouth once daily. Last dose 11/27 preop    . oxyCODONE -acetaminophen  (PERCOCET) 10-325 mg tablet Take 1 tablet by mouth every 4 (four) hours as needed for Pain.     . pantoprazole  (PROTONIX ) 40 MG DR tablet Take 40 mg by mouth once daily    . sacubitriL -valsartan  (ENTRESTO ) 49-51 mg tablet Take 1 tablet by mouth 2 (two) times daily    . spironolactone  (ALDACTONE ) 25 MG tablet Take 1 tablet by mouth once daily    . traZODone  (DESYREL ) 150 MG tablet Take 1 tablet by mouth nightly    . apixaban  (ELIQUIS ) 5 mg tablet Take 5 mg by mouth every 12 (twelve) hours.   (Patient not taking: Reported on 07/31/2024)    .  BELSOMRA  10 mg Tab Take 10 mg by mouth at bedtime as needed (Patient not taking: Reported on 07/31/2024)    . BREO ELLIPTA  100-25 mcg/dose DsDv inhaler 1 Puff once daily (Patient not taking: Reported on 07/31/2024)    . FUROsemide  (LASIX ) 20 MG tablet Take 20 mg by mouth once daily as needed (Patient not taking: Reported on 07/31/2024)    . gabapentin  (NEURONTIN ) 300 MG capsule Take 300 mg by mouth 2 (two) times daily (Patient not taking: Reported on 07/31/2024)    . glipiZIDE  (GLUCOTROL ) 5 MG tablet Take by mouth Take 1 tablet is blood sugar is >300 (Patient not taking: Reported on 07/31/2024)    . hydrOXYzine  pamoate (VISTARIL ) 25 MG capsule Take 25 mg by mouth 3 (three) times daily as needed (Patient not taking: Reported on 07/31/2024)    . iron  polysaccharides 150 mg iron  capsule Take 1 capsule by mouth once daily (Patient not taking: Reported on 07/31/2024)    . lidocaine  (LIDODERM ) 5 % patch  (Patient not taking: Reported on 04/16/2023)    . linaCLOtide  (LINZESS ) 72 mcg capsule Take 1 capsule (72 mcg total) by mouth daily with breakfast (Patient not taking: Reported on 07/31/2024) 90 capsule 0  . magnesium  oxide (MAG-OX) 400 mg tablet Take 1 tablet by mouth once daily (Patient not taking: Reported on 07/31/2024)    . meclizine  (ANTIVERT ) 25 mg tablet Take 25 mg by mouth 3 (three) times daily as needed (Patient not taking: Reported on 07/31/2024)    . metaxalone  (SKELAXIN ) 800 mg tablet Take by mouth (Patient not taking: Reported on 07/31/2024)    . metroNIDAZOLE  (METROGEL ) 1 % gel Apply 1 Application topically once daily (Patient not taking: Reported on 07/31/2024)    . omeprazole (PRILOSEC) 40 MG DR capsule TAKE 1 CAPSULE BY MOUTH 2 TIMES DAILY 30 MINUTES BEFORE BREAKFAST AND DINNER (Patient not taking: Reported on 07/31/2024) 180 capsule 0  . predniSONE  (DELTASONE ) 10 MG tablet  (Patient not taking: Reported on 07/31/2024)    . tamsulosin  (FLOMAX ) 0.4 mg capsule Take 0.4 mg by mouth once daily (Patient not  taking: Reported on 07/31/2024)    . venlafaxine  (EFFEXOR -XR) 75 MG XR capsule Take 75 mg by mouth once daily (Patient not taking: Reported on 07/31/2024)    . zolpidem  (AMBIEN ) 5 MG tablet Take 5 mg by mouth at bedtime as needed for Sleep (Patient not taking: Reported on 07/31/2024)     No current facility-administered medications for this visit.    Additional Past Medical, Social, and Family History:  ADDITIONAL PROBLEM LIST: Problem List  Date Reviewed: 07/31/2024        ICD-10-CM Priority Class Noted - Resolved Diagnosed   Bipolar disorder (CMS/HHS-HCC) F31.9   06/10/2024 - Present    Mild cognitive impairment G31.84   06/09/2024 - Present    RLL pneumonia J18.9   05/30/2024 - Present    Blood pressure alteration R68.89   10/13/2023 - Present    Malfunction of continuous or biphasic positive airway pressure machine T88.8XXA   10/13/2023 - Present    FTT (failure to thrive) in adult R62.7   07/21/2023 - Present    AKI (acute kidney injury) () N17.9   07/15/2023 - Present    PAD (peripheral artery disease) () I73.9   07/11/2023 - Present    Abnormal gait R26.9   06/18/2023 - Present    Atherosclerosis of aorta () I70.0   06/18/2023 - Present    Atherosclerosis of coronary artery without angina pectoris I25.10   06/18/2023 - Present    Congestive heart failure (CMS/HHS-HCC) I50.9   06/18/2023 - Present    Diverticular disease K57.90   06/18/2023 - Present    Gout M10.9   02/22/2023 - Present    Acute on chronic systolic CHF (congestive heart failure) (CMS/HHS-HCC) I50.23   07/31/2022 - Present    Overview Signed 07/31/2022  1:03 PM by Evern Meade RAMAN, CMA  Formatting of this note might be different from the original. a. 06/2009 Echo: EF 60-65%, Gr 1 DD, triv AI, mildly dil LA, nl RV. Last Assessment & Plan: Formatting of this note might be different from the original. Shortness of breath with elevated BNP in the 300s and chest x-ray showing possible pulmonary edema IV Lasix  Continue home lisinopril  and  metoprolol  Daily weights with intake and output monitoring Last EF at [facility] 06/15/2022 with EF 50%      Hx of CABG Z95.1   07/31/2022 - Present    Dyslipidemia E78.5   06/28/2022 - Present    Overview Signed 07/31/2022  1:03 PM by Evern Meade RAMAN, CMA  Last Assessment & Plan: Formatting of this note is different from the original. -We will continue statin therapy.      Hypokalemia E87.6   06/28/2022 - Present    Overview Signed 07/31/2022  1:03 PM by Evern Meade RAMAN, CMA  Last Assessment & Plan: Formatting of this note might be different from the original. Resolved.  Potassium at 4.2      Polycythemia D75.1   10/05/2020 - Present    Primary insomnia F51.01   05/13/2020 - Present    Recurrent major depressive disorder, remission status unspecified () F33.9   03/29/2020 - Present    Elevated troponin R79.89   03/07/2020 - Present    Bereavement Z63.4   09/09/2019 - Present    Chronic respiratory acidosis (CMS/HHS-HCC) J96.12   09/07/2019 - Present    Trigger thumb of right hand M65.311   11/28/2018 - Present    History of esophagitis Z87.19   10/15/2018 - Present    Eye pain, left H57.12   08/18/2018 - Present    Nausea R11.0   06/09/2018 - Present    Seborrheic dermatitis L21.9   06/09/2018 - Present    Acute on chronic heart failure (CMS/HHS-HCC) I50.9   04/18/2018 - Present    History of adenomatous polyp of colon Z86.0101   04/05/2018 - Present    Overview Signed 10/02/2020  3:32 PM by Mevelyn Moats, CMA  Formatting of this note  might be different from the original. Tubular adenoma transverse colon 04/01/18 dr geryl      Abdominal pain, chronic, epigastric R10.13, G89.29   11/06/2017 - Present    Non-ST elevation myocardial infarction (NSTEMI), subendocardial infarction, subsequent episode of care (CMS/HHS-HCC) I21.4   11/20/2016 - Present    Paroxysmal A-fib (CMS/HHS-HCC) I48.0   10/14/2016 - Present    Anemia D64.9   04/03/2016 - Present    CKD (chronic kidney disease) stage  2, GFR 60-89 ml/min N18.2   04/03/2016 - Present    Lower extremity edema R60.0   12/05/2015 - Present    Anxiety F41.9   11/07/2015 - Present    Left inguinal hernia K40.90   11/07/2015 - Present    Overview Signed 10/02/2020  3:32 PM by Mevelyn Moats, CMA  Formatting of this note might be different from the original. Very small containing only fat per [facility] 10/20/15      Mixed anxiety depressive disorder F41.8   11/07/2015 - Present    Sciatica of left side M54.32   09/27/2015 - Present    Nocturnal hypoxia G47.34   09/06/2015 - Present    BPH (benign prostatic hyperplasia) N40.0   08/01/2015 - Present    Chest pain R07.9   07/11/2015 - Present    Overview Signed 10/29/2022 10:04 AM by Evern Meade RAMAN, CMA  Last Assessment & Plan: Formatting of this note is different from the original. - The patient will be admitted to an observation cardiac telemetry bed. - We will follow serial troponins. - The patient will be placed on as needed sublingual nitroglycerin  and IV morphine  sulfate for pain as well as aspirin . - Statin therapy will be provided and fasting lipids will be checked. - We will resume beta-blocker therapy. - Cardiology consult will be obtained. - I notified Dr. Hester about patient.      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  68yo man with dysphagia.  Barium Swallow from OSH on 07/18/14-There are moderate changes of presbyesophagus. While there is some delay in propagation of the primary and secondary peristaltic waves through the distal esophagus, no obstruction to passage of the barium pill (12mm) was demonstrated. There is prominence of the mucosal folds in the gastric cardia and proximal gastric body. Direct  visualization is recommended.  Esophageal manometry from OSH on 07/04/14-The LES failed to relax in all swallows. Median IRP of 51 is severely elevated. Pan-esophageal pressurization in all swallows. Normal LES resting pressure. Impression: Type II achalasia  EGD from OSH on 12/06/13-Report and images in paper chart. Erythema at GE junction. Gastritis with hemorrhage. Erythematous duodenopathy. Normal 2nd part of the duodenum.      CAD (coronary artery disease) I25.10   Unknown - Present    Overview Addendum 11/20/2016  1:36 PM by Hester Wolm Heinz, MD  cabg x2 with pci and stent of om1 lad  And PCI and stent of svg to OM1 2017      Essential hypertension I10   Unknown - Present    Mixed hyperlipidemia E78.2   Unknown - Present    Diabetes mellitus type 2, uncomplicated (CMS/HHS-HCC) E11.9   Unknown - Present    COPD (chronic obstructive pulmonary disease) (  CMS/HHS-HCC) J44.9   Unknown - Present    GERD (gastroesophageal reflux disease) K21.9   06/07/2014 - Present    Tobacco abuse Z72.0   04/11/2013 - Present    Abnormal sputum color R09.3   04/09/2013 - Present    Coughing R05.9   04/09/2013 - Present    RESOLVED: Morbid obesity (CMS/HHS-HCC) E66.01   05/29/2019 - 06/05/2022    RESOLVED: Easy bruising R23.3   07/30/2016 - 06/14/2017    RESOLVED: Shortness of breath R06.02   09/11/2014 - 10/11/2017     ADDITIONAL MEDICAL HISTORY: Past Medical History:  Diagnosis Date  . Anemia   . Arthritis   . CAD (coronary artery disease)   . COPD (chronic obstructive pulmonary disease) (CMS/HHS-HCC)   . Diabetes mellitus type 2, uncomplicated (CMS/HHS-HCC)   . Diverticulosis 03/11/2015  . Gastritis 03/11/2015  . GERD (gastroesophageal reflux disease)   . Hyperlipidemia   . Hypertension   . Pancreatitis (HHS-HCC)   . Reflux esophagitis 03/11/2015  . Tubular adenoma of colon 03/11/2015  . Unstable angina (CMS/HHS-HCC)     Social History: See HPI above. Additionally: Social History   Socioeconomic  History  . Marital status: Married  Tobacco Use  . Smoking status: Former    Current packs/day: 0.00    Average packs/day: 4.0 packs/day for 50.0 years (200.0 ttl pk-yrs)    Types: Cigarettes    Start date: 05/03/1963    Quit date: 05/02/2013    Years since quitting: 11.2  . Smokeless tobacco: Never  Vaping Use  . Vaping status: Never Used  Substance and Sexual Activity  . Alcohol use: No  . Drug use: No  . Sexual activity: Defer   Social Drivers of Health   Financial Resource Strain: Low Risk  (06/19/2024)   Received from [facility]   Overall Financial Resource Strain (CARDIA)   . How hard is it for you to pay for the very basics like food, housing, medical care, and heating?: Not very hard  Food Insecurity: Food Insecurity Present (06/16/2024)   Received from [facility]   Hunger Vital Sign   . Within the past 12 months, you worried that your food would run out before you got the money to buy more.: Sometimes true   . Within the past 12 months, the food you bought just didn't last and you didn't have money to get more.: Never true  Transportation Needs: No Transportation Needs (06/16/2024)   Received from [facility] - Transportation   . In the past 12 months, has lack of transportation kept you from medical appointments or from getting medications?: No   . In the past 12 months, has lack of transportation kept you from meetings, work, or from getting things needed for daily living?: No  Physical Activity: Insufficiently Active (03/14/2024)   Received from [facility]   Exercise Vital Sign   . On average, how many days per week do you engage in moderate to strenuous exercise (like a brisk walk)?: 3 days   . On average, how many minutes do you engage in exercise at this level?: 20 min  Stress: No Stress Concern Present (03/14/2024)   Received from [facility] of Occupational Health - Occupational Stress Questionnaire   . Feeling of Stress : Only a  little  Recent Concern: Stress - Stress Concern Present (01/13/2024)   Received from [facility] of Occupational Health - Occupational Stress Questionnaire   .  Feeling of Stress : To some extent  Social Connections: Socially Isolated (06/10/2024)   Received from [facility]   Social Connection and Isolation Panel   . In a typical week, how many times do you talk on the phone with family, friends, or neighbors?: More than three times a week   . How often do you get together with friends or relatives?: Once a week   . How often do you attend church or religious services?: Never   . Do you belong to any clubs or organizations such as church groups, unions, fraternal or athletic groups, or school groups?: No   . How often do you attend meetings of the clubs or organizations you belong to?: Never   . Are you married, widowed, divorced, separated, never married, or living with a partner?: Widowed  Housing Stability: Low Risk  (06/19/2024)   Received from [facility]   . Within the past 12 months, have you ever stayed: outside, in a car, in a tent, in an overnight shelter, or temporarily in someone else's home(i.e.couch-surfing)?: No   . Are you worried about losing your housing?: No    Family History: See HPI above. Additionally: Family History  Problem Relation Name Age of Onset  . COPD Mother    . Diabetes type II Father    . Cirrhosis Father    . Myocardial Infarction (Heart attack) Father    . Parkinsonism Brother    . Colon cancer Neg Hx    . Colon polyps Neg Hx    . Anesthesia problems Neg Hx    . Malignant hyperthermia Neg Hx      Review of Systems: See HPI above. Additionally: Constitutional: no fevers or chills Eyes: no diplopia ENT: no epistaxis Cardiovascular: See HPI above Respiratory: See HPI above Gastrointestinal: no BRBPR Genitourinary: no hematuria Musculoskeletal: no joint swelling Skin: no rash Neurological: no strokelike  sx Psychiatric: no SI Endocrine: no night sweats Hematologic/Lymphatic: no bleeding Allergic/Immunologic: See allergies above   Physical Exam   Vitals:   07/31/24 1406  BP: (!) 144/80  Pulse: 73   Body mass index is 28.94 kg/m.  Wt Readings from Last 3 Encounters:  07/31/24 83.8 kg (184 lb 12.8 oz)  06/15/23 89.3 kg (196 lb 12.8 oz)  04/16/23 81.6 kg (180 lb)   BP Readings from Last 3 Encounters:  07/31/24 (!) 144/80  06/07/24 130/74  06/15/23 (!) 180/90   Pulse Readings from Last 3 Encounters:  07/31/24 73  06/07/24 76  06/15/23 73     Gen: NAD Neck: No JVD CV: nl s1s2, nl rate, irreg irreg, nl s1 s2 Resp: CTAB, nl effort GI: abd soft, NT Skin: warm Edema: trace B LE edema MSK: no obvious deformity Psych: nl affect Neuro: awake and alert Endo: no thyromegaly Lymph: no cervical LAD   Data/Results   No results for input(s): CHOLTOTAL, HDL, LDLCALC, LDLDIRECT, LDL, VLDL, TRIG in the last 73719 hours.  No results for input(s): NA, K, BUN, CREATININE, CO2, GLUCOSE, GLUF, ALT, AST, TBILI, ALB in the last 73719 hours.  No results for input(s): WBC, HGB, HCT, MCV, PLT in the last 73719 hours.  Recent Labs    04/03/22 1008 07/29/23 0700 01/31/24 1632  HGBA1C 7.2* 5.5 5.8*     TTE: I reviewed and personally interpreted the images from most recent [facility] echo 05/2022 REGIONALLY-IMPAIRED LEFT VENTRICLE WITH PRESERVED SYSTOLIC FUNCTION; ESTIMATED EF = 50 % NORMAL RIGHT VENTRICULAR SYSTOLIC FUNCTION MILD TRICUSPID AND  MITRAL VALVE INSUFFICIENCY TRACE AORTIC VALVE INSUFFICIENCY NO VALVULAR STENOSIS MILD RV ENLARGEMENT MILD BIATRIAL ENLARGEMENT MILD LVH APICAL SEPTAL AKINESIS  Outside labs reviewed: Cr 0.84 07/27/24 Hgb 16 07/27/24  Assessment  68 y.o. male patient with:  -chronic HFrEF 2/2 ICM, NYHA II-III -CAD s/p CABG x2 2002 (LIMA-LAD, SVG-OM), multiple subsequent PCIs -paroxysmal AF -chronic  anticoagulation -HTN -HLD -former tobacco use   Plan   -obtain repeat echo; results from this will guide further efforts at GDMT titration -continue current GDMT for HFrEF for now: farxiga  10 daily, metoprolol  succinate 25 qd, mid dose entresto , spiro 12.5 daily -continue lasix  20 po daily -continue eliquis  5 mg po bid -continue Lipitor  -continue ISMN 60 mg po daily - may need to decrease this dose in favor of increasing some of his HF GDMT; will see what echo shows -continue ASA 81 mg po daily - ideally would be on plavix  instead of ASA given coronary hx and need for concomitant DOAC, but can leave him on ASA for now -f/u 2-3 mos   Attestation Statement:   I personally performed the service. (TP)  DEARL LEAVEN, MD   DEARL LEAVEN MD [facility] Assistant Professor of Medicine, Division of Cardiology [facility]

## 2024-08-01 ENCOUNTER — Emergency Department

## 2024-08-01 ENCOUNTER — Other Ambulatory Visit: Payer: Self-pay

## 2024-08-01 ENCOUNTER — Emergency Department
Admission: EM | Admit: 2024-08-01 | Discharge: 2024-08-02 | Disposition: A | Discharge status: Home / Self Care | Attending: Emergency Medicine | Admitting: Emergency Medicine

## 2024-08-01 DIAGNOSIS — R079 Chest pain, unspecified: Secondary | ICD-10-CM

## 2024-08-01 DIAGNOSIS — I4891 Unspecified atrial fibrillation: Secondary | ICD-10-CM | POA: Diagnosis not present

## 2024-08-01 DIAGNOSIS — R0602 Shortness of breath: Secondary | ICD-10-CM

## 2024-08-01 DIAGNOSIS — I509 Heart failure, unspecified: Secondary | ICD-10-CM | POA: Diagnosis not present

## 2024-08-01 DIAGNOSIS — J441 Chronic obstructive pulmonary disease with (acute) exacerbation: Secondary | ICD-10-CM | POA: Diagnosis not present

## 2024-08-01 DIAGNOSIS — E119 Type 2 diabetes mellitus without complications: Secondary | ICD-10-CM | POA: Insufficient documentation

## 2024-08-01 DIAGNOSIS — I2510 Atherosclerotic heart disease of native coronary artery without angina pectoris: Secondary | ICD-10-CM | POA: Insufficient documentation

## 2024-08-01 DIAGNOSIS — Z7901 Long term (current) use of anticoagulants: Secondary | ICD-10-CM | POA: Insufficient documentation

## 2024-08-01 LAB — CBC
HCT: 49 % (ref 39.0–52.0)
Hemoglobin: 15.8 g/dL (ref 13.0–17.0)
MCH: 30 pg (ref 26.0–34.0)
MCHC: 32.2 g/dL (ref 30.0–36.0)
MCV: 93 fL (ref 80.0–100.0)
Platelets: 175 K/uL (ref 150–400)
RBC: 5.27 MIL/uL (ref 4.22–5.81)
RDW: 14.6 % (ref 11.5–15.5)
WBC: 6 K/uL (ref 4.0–10.5)
nRBC: 0 % (ref 0.0–0.2)

## 2024-08-01 LAB — RESP PANEL BY RT-PCR (RSV, FLU A&B, COVID)  RVPGX2
Influenza A by PCR: NEGATIVE
Influenza B by PCR: NEGATIVE
Resp Syncytial Virus by PCR: NEGATIVE
SARS Coronavirus 2 by RT PCR: NEGATIVE

## 2024-08-01 LAB — BASIC METABOLIC PANEL WITH GFR
Anion gap: 12 (ref 5–15)
BUN: 19 mg/dL (ref 8–23)
CO2: 23 mmol/L (ref 22–32)
Calcium: 9.5 mg/dL (ref 8.9–10.3)
Chloride: 106 mmol/L (ref 98–111)
Creatinine, Ser: 0.96 mg/dL (ref 0.61–1.24)
GFR, Estimated: >60 mL/min (ref >60)
Glucose, Bld: 197 mg/dL — ABNORMAL HIGH (ref 70–99)
Potassium: 3.8 mmol/L (ref 3.5–5.1)
Sodium: 141 mmol/L (ref 135–145)

## 2024-08-01 LAB — TROPONIN I (HIGH SENSITIVITY)
Troponin I (High Sensitivity): 15 ng/L (ref <18)
Troponin I (High Sensitivity): 17 ng/L (ref <18)

## 2024-08-01 MED ORDER — IPRATROPIUM-ALBUTEROL 0.5-2.5 (3) MG/3ML IN SOLN
9.0000 mL | Freq: Once | RESPIRATORY_TRACT | Status: AC
  Administered 2024-08-02: 9 mL via RESPIRATORY_TRACT
  Filled 2024-08-01: qty 3

## 2024-08-01 MED ORDER — PREDNISONE 20 MG PO TABS
40.0000 mg | ORAL_TABLET | Freq: Once | ORAL | Status: AC
  Administered 2024-08-02: 40 mg via ORAL
  Filled 2024-08-01: qty 2

## 2024-08-01 MED ORDER — FUROSEMIDE 10 MG/ML IJ SOLN
40.0000 mg | Freq: Once | INTRAMUSCULAR | Status: AC
  Administered 2024-08-02: 40 mg via INTRAVENOUS
  Filled 2024-08-01: qty 4

## 2024-08-01 NOTE — ED Notes (Signed)
 Pt reports chest pain since this am with intermittent sob.  Pt also has htn and reports elevated bp today after taking bp meds.  Iv in place.  Pt alert  speech clear.  A fib on monitor.

## 2024-08-01 NOTE — ED Provider Notes (Signed)
 [facility] Provider Note    Event Date/Time   First MD Initiated Contact with Patient 08/01/24 2300     (approximate)   History   Chest Pain   HPI  Lawrence [name] is a 68 y.o. male   Past medical history of atrial fibrillation on Eliquis , type 2 diabetes, CHF, celiac disease, CAD, COPD who presents to Emergency Department with productive cough over the last couple of days, shortness of breath, chest tightness.  Also notes mild swelling to bilateral legs.  Has been fully compliant with all medications including Eliquis .  Was seen in the emergency department at [facility] last week for CHF exacerbation.  Denies fever.  Denies abdominal pain.  Denies GU symptoms.   External Medical Documents Reviewed: Cardiology note from this week as well as [facility] visit from last week for CHF exacerbation      Physical Exam   Triage Vital Signs: ED Triage Vitals  Encounter Vitals Group     BP 08/01/24 1915 (!) 150/104     Girls Systolic BP Percentile --      Girls Diastolic BP Percentile --      Boys Systolic BP Percentile --      Boys Diastolic BP Percentile --      Pulse Rate 08/01/24 1915 75     Resp 08/01/24 1915 19     Temp 08/01/24 1915 98 F (36.7 C)     Temp Source 08/01/24 2129 Oral     SpO2 08/01/24 1915 97 %     Weight 08/01/24 1914 190 lb (86.2 kg)     Height 08/01/24 1914 5' 7 (1.702 m)     Head Circumference --      Peak Flow --      Pain Score 08/01/24 1914 8     Pain Loc --      Pain Education --      Exclude from Growth Chart --     Most recent vital signs: Vitals:   08/01/24 2303 08/01/24 2305  BP: (!) 168/99 (!) 179/89  Pulse:  75  Resp: 12 12  Temp:    SpO2:  98%    General: Awake, no distress.  CV:  Good peripheral perfusion.  Resp:  Normal effort.  Abd:  No distention.  Other:  No respiratory distress no hypoxemia on room air.  Speaking full sentences.  No accessory muscle use.  Lungs with some mild wheezing throughout  and some fine crackles at bases.  Very mild edema bilateral ankles.   ED Results / Procedures / Treatments   Labs (all labs ordered are listed, but only abnormal results are displayed) Labs Reviewed  BASIC METABOLIC PANEL WITH GFR - Abnormal; Notable for the following components:      Result Value   Glucose, Bld 197 (*)    All other components within normal limits  RESP PANEL BY RT-PCR (RSV, FLU A&B, COVID)  RVPGX2  CBC  BRAIN NATRIURETIC PEPTIDE  TROPONIN I (HIGH SENSITIVITY)  TROPONIN I (HIGH SENSITIVITY)     I ordered and reviewed the above labs they are notable for cell counts electrolytes and serial troponins unremarkable  EKG  ED ECG REPORT I, Ginnie Shams, the attending physician, personally viewed and interpreted this ECG.   Date: 08/01/2024  EKG Time: 1912  Rate: 115  Rhythm: AF RVR  Axis: NL  Intervals:nl  ST&T Change: no stemi    RADIOLOGY I independently reviewed and interpreted chest x-ray and see no  obvious focality pneumothorax I also reviewed radiologist's formal read.   PROCEDURES:  Critical Care performed: No  Procedures   MEDICATIONS ORDERED IN ED: Medications  predniSONE  (DELTASONE ) tablet 40 mg (has no administration in time range)  ipratropium-albuterol  (DUONEB) 0.5-2.5 (3) MG/3ML nebulizer solution 9 mL (has no administration in time range)  furosemide  (LASIX ) injection 40 mg (has no administration in time range)     IMPRESSION / MDM / ASSESSMENT AND PLAN / ED COURSE  I reviewed the triage vital signs and the nursing notes.                                Patient's presentation is most consistent with acute presentation with potential threat to life or bodily function.  Differential diagnosis includes, but is not limited to, CHF exacerbation, COPD exacerbation, viral URI, bacterial pneumonia, ACS, PE, dissection, electrolyte derangements, atrial fibrillation with RVR   The patient is on the cardiac monitor to evaluate for  evidence of arrhythmia and/or significant heart rate changes.  MDM: Patient with respiratory infectious symptoms over the last couple of days with a mixed clinical picture of CHF and COPD exacerbation.  Fortunately no respiratory distress and normal vital signs and normal serial troponins doubt ACS or cardiopulmonary emergency, doubt PE as he is fully compliant with his Eliquis .  Will treat the CHF with a IV dose of Lasix .   treat mild COPD exacerbation with nebulizers, short course of steroids.  No evidence of bacterial infection on my clinical exam of the lungs, no leukocytosis or fever, and clear chest x-ray deferred antiobiotics at this time.  Viral panel negative.  After treatment I anticipate he can be discharged and follow-up with his cardiologist and PMD for further outpatient monitoring and treatment planning as needed.  I considered hospitalization but given his hemodynamic stability, mild exacerbation of both COPD and CHF, I do not see indication for hospitalization at this time.        FINAL CLINICAL IMPRESSION(S) / ED DIAGNOSES   Final diagnoses:  Nonspecific chest pain  Shortness of breath  COPD exacerbation (HCC)  Acute on chronic congestive heart failure, unspecified heart failure type (HCC)     Rx / DC Orders   ED Discharge Orders     None        Note:  This document was prepared using Dragon voice recognition software and may include unintentional dictation errors.    Cyrena Mylar, MD 08/02/24 STANLY

## 2024-08-01 NOTE — ED Triage Notes (Signed)
 Pt reports chest pain since this morning pt has hx afib COPD, pt states pain is heaviness in center of chest. Pt also reports new productive cough

## 2024-08-02 DIAGNOSIS — J441 Chronic obstructive pulmonary disease with (acute) exacerbation: Secondary | ICD-10-CM | POA: Diagnosis not present

## 2024-08-02 LAB — BRAIN NATRIURETIC PEPTIDE: B Natriuretic Peptide: 165.3 pg/mL — ABNORMAL HIGH (ref 0.0–100.0)

## 2024-08-02 MED ORDER — PREDNISONE 20 MG PO TABS: 40.0000 mg | ORAL_TABLET | Freq: Every day | ORAL | 0 refills | Status: AC

## 2024-08-02 MED ORDER — KETOROLAC TROMETHAMINE 15 MG/ML IJ SOLN
15.0000 mg | Freq: Once | INTRAMUSCULAR | Status: AC
  Administered 2024-08-02: 15 mg via INTRAVENOUS
  Filled 2024-08-02: qty 1

## 2024-08-02 MED ORDER — OXYCODONE HCL 5 MG PO TABS
10.0000 mg | ORAL_TABLET | Freq: Once | ORAL | Status: AC
  Administered 2024-08-02: 10 mg via ORAL
  Filled 2024-08-02: qty 2

## 2024-08-02 NOTE — ED Notes (Signed)
 Meds given.

## 2024-08-02 NOTE — ED Notes (Signed)
 Patient given discharge instructions including prescriptions x1 and importance of follow up appt as needed with stated understanding. INT removed, cannula intact, pressure dressing applied. Patient stable and ambulatory to waiting room to await ride.

## 2024-08-02 NOTE — Discharge Instructions (Signed)
 Thank you for choosing us  for your health care today!  Please see your primary doctor this week for a follow up appointment.  Please call your heart doctor as well for an appointment.  If you have any new, worsening, or unexpected symptoms call your doctor right away or come back to the emergency department for reevaluation.  It was my pleasure to care for you today.   Ginnie EDISON Cyrena, MD

## 2024-08-03 ENCOUNTER — Other Ambulatory Visit: Payer: Self-pay | Admitting: Family Medicine

## 2024-08-03 NOTE — Telephone Encounter (Signed)
 Copied from CRM [id number]. Topic: Clinical - Medication Refill >> Aug 03, 2024  3:06 PM Santiya F wrote: Medication: furosemide  (LASIX ) 20 MG tablet [[id number]]  Has the patient contacted their pharmacy? Yes  (Agent: If yes, when and what did the pharmacy advise?) contact office   This is the patient's preferred pharmacy:  [facility] - [place], [place] - [address] [place] [address] Phone: [phone] Fax: [phone]  Is this the correct pharmacy for this prescription? Yes If no, delete pharmacy and type the correct one.   Has the prescription been filled recently? Yes  Is the patient out of the medication? Yes  Has the patient been seen for an appointment in the last year OR does the patient have an upcoming appointment? Yes  Can we respond through MyChart? No  Agent: Please be advised that Rx refills may take up to 3 business days. We ask that you follow-up with your pharmacy.

## 2024-08-04 NOTE — Telephone Encounter (Signed)
 Requested medication (s) are due for refill today: -  Requested medication (s) are on the active medication list: hx med  Last refill:  06/21/24  Future visit scheduled: yes  Notes to clinic:  hx provider   Requested Prescriptions  Pending Prescriptions Disp Refills   furosemide  (LASIX ) 20 MG tablet 30 tablet     Sig: Take 1 tablet (20 mg total) by mouth.     Cardiovascular:  Diuretics - Loop Failed - 08/04/2024  1:24 PM      Failed - Last BP in normal range    BP Readings from Last 1 Encounters:  08/02/24 (!) 178/131         Passed - K in normal range and within 180 days    Potassium  Date Value Ref Range Status  08/01/2024 3.8 3.5 - 5.1 mmol/L Final  03/07/2015 3.9 mmol/L Final    Comment:    3.5-5.1 NOTE: New Reference Range  01/22/15          Passed - Ca in normal range and within 180 days    Calcium   Date Value Ref Range Status  08/01/2024 9.5 8.9 - 10.3 mg/dL Final   Calcium , Total  Date Value Ref Range Status  03/07/2015 9.6 mg/dL Final    Comment:    1.0-89.6 NOTE: New Reference Range  01/22/15          Passed - Na in normal range and within 180 days    Sodium  Date Value Ref Range Status  08/01/2024 141 135 - 145 mmol/L Final  07/03/2024 142 134 - 144 mmol/L Final  03/07/2015 143 mmol/L Final    Comment:    135-145 NOTE: New Reference Range  01/22/15          Passed - Cr in normal range and within 180 days    Creatinine  Date Value Ref Range Status  03/07/2015 0.84 mg/dL Final    Comment:    9.38-8.75 NOTE: New Reference Range  01/22/15    Creatinine, Ser  Date Value Ref Range Status  08/01/2024 0.96 0.61 - 1.24 mg/dL Final   Creatinine, POC  Date Value Ref Range Status  10/27/2016 n/a mg/dL Final         Passed - Cl in normal range and within 180 days    Chloride  Date Value Ref Range Status  08/01/2024 106 98 - 111 mmol/L Final  03/07/2015 107 mmol/L Final    Comment:    101-111 NOTE: New Reference Range  01/22/15           Passed - Mg Level in normal range and within 180 days    Magnesium   Date Value Ref Range Status  06/12/2024 2.1 1.7 - 2.4 mg/dL Final    Comment:    Performed at [facility], [address] [place], [place] [address]  05/24/2014 1.9 mg/dL Final    Comment:    8.1-7.5 THERAPEUTIC RANGE: 4-7 mg/dL TOXIC: > 10 mg/dL  -----------------------          Passed - Valid encounter within last 6 months    Recent Outpatient Visits           1 week ago Chronic combined systolic (congestive) and diastolic (congestive) heart failure [facility]   [place] [facility] Issac Peyton BRAVO, RPH   1 month ago Bilious vomiting with nausea   [facility] [name], MD   1 month ago Pneumonia of right lower lobe due  to infectious organism   [facility] [name], MD   2 months ago Pneumonia due to infectious organism, unspecified laterality, unspecified part of lung    [facility] [name], MD   4 months ago OSA (obstructive sleep apnea)   [facility] [name], MD       Future Appointments             In 2 weeks [facility]

## 2024-08-08 MED ORDER — FUROSEMIDE 20 MG PO TABS: 20.0000 mg | ORAL_TABLET | Freq: Every day | ORAL | 1 refills | Status: DC | PRN

## 2024-08-11 ENCOUNTER — Ambulatory Visit: Admitting: Family Medicine

## 2024-08-11 ENCOUNTER — Encounter: Payer: Self-pay | Admitting: Family Medicine

## 2024-08-11 VITALS — BP 138/98 | HR 93 | Ht 67.0 in | Wt 178.1 lb

## 2024-08-11 DIAGNOSIS — R109 Unspecified abdominal pain: Secondary | ICD-10-CM

## 2024-08-11 DIAGNOSIS — I10 Essential (primary) hypertension: Secondary | ICD-10-CM | POA: Diagnosis not present

## 2024-08-11 DIAGNOSIS — E559 Vitamin D deficiency, unspecified: Secondary | ICD-10-CM | POA: Diagnosis not present

## 2024-08-11 DIAGNOSIS — I482 Chronic atrial fibrillation, unspecified: Secondary | ICD-10-CM

## 2024-08-11 DIAGNOSIS — Z23 Encounter for immunization: Secondary | ICD-10-CM

## 2024-08-11 DIAGNOSIS — I5043 Acute on chronic combined systolic (congestive) and diastolic (congestive) heart failure: Secondary | ICD-10-CM

## 2024-08-11 DIAGNOSIS — R1084 Generalized abdominal pain: Secondary | ICD-10-CM | POA: Diagnosis not present

## 2024-08-11 DIAGNOSIS — I5042 Chronic combined systolic (congestive) and diastolic (congestive) heart failure: Secondary | ICD-10-CM

## 2024-08-11 DIAGNOSIS — E119 Type 2 diabetes mellitus without complications: Secondary | ICD-10-CM

## 2024-08-11 DIAGNOSIS — R531 Weakness: Secondary | ICD-10-CM

## 2024-08-11 NOTE — Patient Instructions (Signed)
 SABRA  Please review the attached list of medications and notify my office if there are any errors.   . Please bring all of your medications to every appointment so we can make sure that our medication list is the same as yours.

## 2024-08-11 NOTE — Progress Notes (Signed)
 Established patient visit   Patient: Lawrence [name]   DOB: 1956/01/28   68 y.o. Male  MRN: [id number] Visit Date: 08/11/2024  Today's healthcare provider: Nancyann Perry, MD   Chief Complaint  Patient presents with   Medical Management of Chronic Issues   Hypertension    Patient reports taking medications as prescribed. Reports he knows his bp is elevated currently because he can feel it due to feeling it through his heart describing it as elevated. States he checked this morning and it was high but has not checked since then.   Subjective    Discussed the use of AI scribe software for clinical note transcription with the patient, who gave verbal consent to proceed.  History of Present Illness   Lawrence [name] is a 68 year old male with hypertension and atrial fibrillation who presents with elevated blood pressure and abdominal pain.  He has elevated blood pressure despite an increased dose of Entresto , with home readings around 165/115 mmHg. He feels fatigued and 'run down'.  He describes a new onset of right-sided abdominal pain persisting for about a week, worsening with movement and eating, and associated with nausea. The pain eases somewhat about an hour after eating but does not completely resolve. It radiates to his back near the kidney area. He has a history of kidney stones and has had his gallbladder removed.  He is currently taking Entresto , spironolactone  (half a pill daily), and metoprolol  (25 mg). He has not experienced bradycardia but notes that his heart rate can be fast due to atrial fibrillation, sometimes affecting the accuracy of oxygen  level readings.     He is also due for follow up A1c for type 2 diabetes. Taking medications consistently with normal home glucose readings.   Lab Results  Component Value Date   HGBA1C 5.8 02/01/2024   HGBA1C 6.2 (H) 10/18/2023   HGBA1C 6.1 (H) 08/05/2023   Lab Results  Component Value Date   NA 141 08/01/2024   K  3.8 08/01/2024   CREATININE 0.96 08/01/2024   GFRNONAA >60 08/01/2024   GLUCOSE 197 (H) 08/01/2024     Medications: Outpatient Medications Prior to Visit  Medication Sig   Accu-Chek Softclix Lancets lancets Use as instructed to check sugar daily for type 2 diabetes.   albuterol  (PROVENTIL ) (2.5 MG/3ML) 0.083% nebulizer solution Take 3 mLs (2.5 mg total) by nebulization every 6 (six) hours as needed for wheezing or shortness of breath.   allopurinol  (ZYLOPRIM ) 300 MG tablet TAKE 1 TABLET BY MOUTH TWICE A DAY   ALPRAZolam  (XANAX ) 1 MG tablet TAKE 1 TABLET BY MOUTH 3 TIMES A DAY AS NEEDED   apixaban  (ELIQUIS ) 5 MG TABS tablet Take 1 tablet (5 mg total) by mouth 2 (two) times daily.   atorvastatin  (LIPITOR ) 80 MG tablet TAKE 1 TABLET BY MOUTH AT BEDTIME   Blood Glucose Monitoring Suppl (ACCU-CHEK GUIDE) w/Device KIT Use to check blood sugars as directed   Blood Pressure Monitor DEVI Use to check blood pressure daily   BREZTRI  AEROSPHERE 160-9-4.8 MCG/ACT AERO inhaler INHALE 2 PUFFS BY MOUTH INTO THE LUNGS 2TIMES DAILY   cetirizine  (ZYRTEC ) 10 MG tablet Take 1 tablet (10 mg total) by mouth at bedtime.   cholecalciferol  (VITAMIN D3) 25 MCG (1000 UNIT) tablet Take 1,000 Units by mouth daily.   Cyanocobalamin  (VITAMIN B-12) 1000 MCG SUBL Place 1 tablet under the tongue daily.   dapagliflozin  propanediol (FARXIGA ) 10 MG TABS tablet TAKE 1 TABLET BY  MOUTH DAILY   famotidine  (PEPCID ) 20 MG tablet Take 1 tablet (20 mg total) by mouth 2 (two) times daily.   furosemide  (LASIX ) 20 MG tablet Take 1 tablet (20 mg total) by mouth daily as needed.   glucose blood (ACCU-CHEK GUIDE) test strip Use as instructed to check sugar daily for type 2 diabetes.   iron  polysaccharides (FERREX 150) 150 MG capsule Take one capsul every Monday, Wednesday, and Friday   isosorbide  mononitrate (IMDUR ) 120 MG 24 hr tablet Take 1 tablet (120 mg total) by mouth daily.   lithium  carbonate 150 MG capsule Take 150 mg by mouth 2  (two) times daily.   metFORMIN  (GLUCOPHAGE -XR) 500 MG 24 hr tablet TAKE 1 TABLET BY MOUTH DAILY WITH EVENING MEAL   methocarbamol  (ROBAXIN ) 500 MG tablet TAKE 1 TABLET BY MOUTH EVERY 8 HOURS AS NEEDED FOR MUSCLE SPASMS   metoprolol  succinate (TOPROL -XL) 25 MG 24 hr tablet Take 1 tablet (25 mg total) by mouth daily.   nitroGLYCERIN  (NITROSTAT ) 0.4 MG SL tablet Place 1 tablet (0.4 mg total) under the tongue every 5 (five) minutes as needed for chest pain (Do not take if blood pressure is low, systolic BP<110).   omega-3 acid ethyl esters (LOVAZA ) 1 g capsule TAKE 4 CAPSULES (4 GRAMS TOTAL) BY MOUTHDAILY   ondansetron  (ZOFRAN ) 4 MG tablet Take 1 tablet (4 mg total) by mouth every 8 (eight) hours as needed for nausea or vomiting.   oxyCODONE -acetaminophen  (PERCOCET) 10-325 MG tablet TAKE 1 TABLET BY MOUTH EVERY 4 HOURS AS NEEDED FOR PAIN   OXYGEN  Inhale 2 L into the lungs at bedtime as needed (for shortness of breath).   pantoprazole  (PROTONIX ) 40 MG tablet TAKE 1 TABLET BY MOUTH 2 TIMES DAILY (30MINUTES BEFORE A MEAL)   ranolazine  (RANEXA ) 1000 MG SR tablet Take 1 tablet (1,000 mg total) by mouth 2 (two) times daily.   sacubitril -valsartan  (ENTRESTO ) 49-51 MG Take 1 tablet by mouth 2 (two) times daily.   spironolactone  (ALDACTONE ) 25 MG tablet Take 0.5 tablets (12.5 mg total) by mouth daily.   traZODone  (DESYREL ) 150 MG tablet Take 1 tablet (150 mg total) by mouth at bedtime as needed for sleep.   TRINTELLIX  10 MG TABS tablet Take 10 mg by mouth daily.   No facility-administered medications prior to visit.   Review of Systems  Constitutional:  Positive for fatigue. Negative for appetite change, chills and fever.  Respiratory:  Negative for chest tightness, shortness of breath and wheezing.   Cardiovascular:  Negative for chest pain and palpitations.  Gastrointestinal:  Negative for abdominal pain, nausea and vomiting.       Objective    BP (!) 150/111 (BP Location: Left Arm, Patient  Position: Sitting, Cuff Size: Normal)   Pulse 96   Ht 5' 7 (1.702 m)   Wt 178 lb 1.6 oz (80.8 kg)   BMI 27.89 kg/m   Physical Exam   General: Appearance:    Well developed, well nourished male in no acute distress  Eyes:    PERRL, conjunctiva/corneas clear, EOM's intact       Lungs:     Clear to auscultation bilaterally, respirations unlabored  Heart:    Normal heart rate. Irregularly irregular rhythm. No murmurs, rubs, or gallops.    Abd:   Tender RUQ and right flank  MS:   All extremities are intact.    Neurologic:   Awake, alert, oriented x 3. No apparent focal neurological defect.        Assessment &  Plan      1. Primary hypertension (Primary) Uncontrolled. Checking electrolytes today and anticipate increasing spironolactone  to 25 mg.   2. Chronic combined systolic (congestive) and diastolic (congestive) heart failure (HCC) Fairly well compensated, tolerating increased dose of Entresto . Anticipate increase spironolactone  to a full pill if electrolytes stable. Consider increasing dose of Entresto  .   3. Generalized abdominal pain Primarily RUQ and right flank with history of kidney stones, but none were seen on CT in July.   - Comprehensive metabolic panel with GFR - US  Abdomen Limited; Future  4. Vitamin D  deficiency  - VITAMIN D  25 Hydroxy (Vit-D Deficiency, Fractures)  5. Type 2 diabetes mellitus without complication, unspecified whether long term insulin  use Doing well on current medications.  - Hemoglobin A1c  6. Chronic atrial fibrillation with RVR (HCC) On DOAC, in a-fib today, rate near goal. Has had trouble with bradycardia on metoprolol  in the past. Will continue current meds for now .   7. Generalized weakness Multifactorial. Expect he will feel better if BP better controlled.   8. Acute right flank pain  - US  Abdomen Limited; Future  Other orders - Flu vaccine HIGH DOSE PF(Fluzone Trivalent)     Nancyann Perry, MD  [facility] [phone] (phone) [phone] (fax)  [facility]

## 2024-08-12 ENCOUNTER — Ambulatory Visit: Payer: Self-pay | Admitting: Family Medicine

## 2024-08-12 DIAGNOSIS — I5042 Chronic combined systolic (congestive) and diastolic (congestive) heart failure: Secondary | ICD-10-CM

## 2024-08-12 DIAGNOSIS — I10 Essential (primary) hypertension: Secondary | ICD-10-CM

## 2024-08-12 LAB — COMPREHENSIVE METABOLIC PANEL WITH GFR
ALT: 21 IU/L (ref 0–44)
AST: 17 IU/L (ref 0–40)
Albumin: 4.5 g/dL (ref 3.9–4.9)
Alkaline Phosphatase: 77 IU/L (ref 47–123)
BUN/Creatinine Ratio: 15 (ref 10–24)
BUN: 17 mg/dL (ref 8–27)
Bilirubin Total: 0.9 mg/dL (ref 0.0–1.2)
CO2: 24 mmol/L (ref 20–29)
Calcium: 10.2 mg/dL (ref 8.6–10.2)
Chloride: 103 mmol/L (ref 96–106)
Creatinine, Ser: 1.14 mg/dL (ref 0.76–1.27)
Globulin, Total: 2 g/dL (ref 1.5–4.5)
Glucose: 105 mg/dL — ABNORMAL HIGH (ref 70–99)
Potassium: 4.8 mmol/L (ref 3.5–5.2)
Sodium: 143 mmol/L (ref 134–144)
Total Protein: 6.5 g/dL (ref 6.0–8.5)
eGFR: 69 mL/min/1.73 (ref >59)

## 2024-08-12 LAB — HEMOGLOBIN A1C
Est. average glucose Bld gHb Est-mCnc: 148 mg/dL
Hgb A1c MFr Bld: 6.8 % — ABNORMAL HIGH (ref 4.8–5.6)

## 2024-08-12 LAB — VITAMIN D 25 HYDROXY (VIT D DEFICIENCY, FRACTURES): Vit D, 25-Hydroxy: 30.5 ng/mL (ref 30.0–100.0)

## 2024-08-12 MED ORDER — SPIRONOLACTONE 25 MG PO TABS: 25.0000 mg | ORAL_TABLET | Freq: Every day | ORAL | Status: DC

## 2024-08-13 ENCOUNTER — Emergency Department

## 2024-08-13 ENCOUNTER — Observation Stay
Admission: EM | Admit: 2024-08-13 | Discharge: 2024-08-15 | Disposition: A | Attending: Emergency Medicine | Admitting: Emergency Medicine

## 2024-08-13 ENCOUNTER — Other Ambulatory Visit: Payer: Self-pay

## 2024-08-13 DIAGNOSIS — G8929 Other chronic pain: Secondary | ICD-10-CM | POA: Insufficient documentation

## 2024-08-13 DIAGNOSIS — R0789 Other chest pain: Principal | ICD-10-CM | POA: Insufficient documentation

## 2024-08-13 DIAGNOSIS — F419 Anxiety disorder, unspecified: Secondary | ICD-10-CM | POA: Diagnosis not present

## 2024-08-13 DIAGNOSIS — E782 Mixed hyperlipidemia: Secondary | ICD-10-CM | POA: Diagnosis present

## 2024-08-13 DIAGNOSIS — E119 Type 2 diabetes mellitus without complications: Secondary | ICD-10-CM | POA: Diagnosis not present

## 2024-08-13 DIAGNOSIS — J449 Chronic obstructive pulmonary disease, unspecified: Secondary | ICD-10-CM | POA: Diagnosis present

## 2024-08-13 DIAGNOSIS — I4811 Longstanding persistent atrial fibrillation: Secondary | ICD-10-CM | POA: Insufficient documentation

## 2024-08-13 DIAGNOSIS — D582 Other hemoglobinopathies: Secondary | ICD-10-CM | POA: Diagnosis not present

## 2024-08-13 DIAGNOSIS — I2510 Atherosclerotic heart disease of native coronary artery without angina pectoris: Secondary | ICD-10-CM | POA: Diagnosis present

## 2024-08-13 DIAGNOSIS — I110 Hypertensive heart disease with heart failure: Secondary | ICD-10-CM | POA: Diagnosis not present

## 2024-08-13 DIAGNOSIS — D72820 Lymphocytosis (symptomatic): Secondary | ICD-10-CM | POA: Diagnosis not present

## 2024-08-13 DIAGNOSIS — Z85828 Personal history of other malignant neoplasm of skin: Secondary | ICD-10-CM | POA: Insufficient documentation

## 2024-08-13 DIAGNOSIS — I25118 Atherosclerotic heart disease of native coronary artery with other forms of angina pectoris: Secondary | ICD-10-CM | POA: Diagnosis not present

## 2024-08-13 DIAGNOSIS — R079 Chest pain, unspecified: Secondary | ICD-10-CM | POA: Diagnosis not present

## 2024-08-13 DIAGNOSIS — I5022 Chronic systolic (congestive) heart failure: Secondary | ICD-10-CM | POA: Diagnosis present

## 2024-08-13 DIAGNOSIS — I482 Chronic atrial fibrillation, unspecified: Secondary | ICD-10-CM | POA: Diagnosis present

## 2024-08-13 DIAGNOSIS — J4489 Other specified chronic obstructive pulmonary disease: Secondary | ICD-10-CM | POA: Diagnosis not present

## 2024-08-13 DIAGNOSIS — K219 Gastro-esophageal reflux disease without esophagitis: Secondary | ICD-10-CM | POA: Diagnosis present

## 2024-08-13 DIAGNOSIS — R1011 Right upper quadrant pain: Secondary | ICD-10-CM | POA: Diagnosis not present

## 2024-08-13 DIAGNOSIS — Z7901 Long term (current) use of anticoagulants: Secondary | ICD-10-CM | POA: Diagnosis not present

## 2024-08-13 DIAGNOSIS — I5042 Chronic combined systolic (congestive) and diastolic (congestive) heart failure: Secondary | ICD-10-CM | POA: Diagnosis not present

## 2024-08-13 DIAGNOSIS — I10 Essential (primary) hypertension: Secondary | ICD-10-CM | POA: Diagnosis present

## 2024-08-13 DIAGNOSIS — Z79899 Other long term (current) drug therapy: Secondary | ICD-10-CM | POA: Diagnosis not present

## 2024-08-13 DIAGNOSIS — I255 Ischemic cardiomyopathy: Secondary | ICD-10-CM | POA: Diagnosis not present

## 2024-08-13 DIAGNOSIS — Z955 Presence of coronary angioplasty implant and graft: Secondary | ICD-10-CM | POA: Diagnosis not present

## 2024-08-13 DIAGNOSIS — Z7984 Long term (current) use of oral hypoglycemic drugs: Secondary | ICD-10-CM | POA: Diagnosis not present

## 2024-08-13 DIAGNOSIS — Z9861 Coronary angioplasty status: Secondary | ICD-10-CM

## 2024-08-13 DIAGNOSIS — Z87891 Personal history of nicotine dependence: Secondary | ICD-10-CM | POA: Insufficient documentation

## 2024-08-13 DIAGNOSIS — E860 Dehydration: Secondary | ICD-10-CM | POA: Diagnosis not present

## 2024-08-13 DIAGNOSIS — F319 Bipolar disorder, unspecified: Secondary | ICD-10-CM | POA: Diagnosis not present

## 2024-08-13 DIAGNOSIS — Z951 Presence of aortocoronary bypass graft: Secondary | ICD-10-CM | POA: Insufficient documentation

## 2024-08-13 DIAGNOSIS — F418 Other specified anxiety disorders: Secondary | ICD-10-CM | POA: Diagnosis present

## 2024-08-13 LAB — CBC
HCT: 52.1 % — ABNORMAL HIGH (ref 39.0–52.0)
Hemoglobin: 17.4 g/dL — ABNORMAL HIGH (ref 13.0–17.0)
MCH: 30.4 pg (ref 26.0–34.0)
MCHC: 33.4 g/dL (ref 30.0–36.0)
MCV: 91.1 fL (ref 80.0–100.0)
Platelets: 182 K/uL (ref 150–400)
RBC: 5.72 MIL/uL (ref 4.22–5.81)
RDW: 14.3 % (ref 11.5–15.5)
WBC: 9.7 K/uL (ref 4.0–10.5)
nRBC: 0 % (ref 0.0–0.2)

## 2024-08-13 LAB — URINALYSIS, ROUTINE W REFLEX MICROSCOPIC
Bacteria, UA: NONE SEEN
Bilirubin Urine: NEGATIVE
Glucose, UA: >=500 mg/dL — AB
Hgb urine dipstick: NEGATIVE
Ketones, ur: 5 mg/dL — AB
Leukocytes,Ua: NEGATIVE
Nitrite: NEGATIVE
Protein, ur: NEGATIVE mg/dL
Specific Gravity, Urine: 1.039 — ABNORMAL HIGH (ref 1.005–1.030)
Squamous Epithelial / HPF: 0 /HPF (ref 0–5)
pH: 5 (ref 5.0–8.0)

## 2024-08-13 LAB — COMPREHENSIVE METABOLIC PANEL WITH GFR
ALT: 22 U/L (ref 0–44)
AST: 24 U/L (ref 15–41)
Albumin: 3.8 g/dL (ref 3.5–5.0)
Alkaline Phosphatase: 54 U/L (ref 38–126)
Anion gap: 12 (ref 5–15)
BUN: 16 mg/dL (ref 8–23)
CO2: 27 mmol/L (ref 22–32)
Calcium: 9.6 mg/dL (ref 8.9–10.3)
Chloride: 100 mmol/L (ref 98–111)
Creatinine, Ser: 1 mg/dL (ref 0.61–1.24)
GFR, Estimated: >60 mL/min (ref >60)
Glucose, Bld: 113 mg/dL — ABNORMAL HIGH (ref 70–99)
Potassium: 4.4 mmol/L (ref 3.5–5.1)
Sodium: 139 mmol/L (ref 135–145)
Total Bilirubin: 1.8 mg/dL — ABNORMAL HIGH (ref 0.0–1.2)
Total Protein: 6.6 g/dL (ref 6.5–8.1)

## 2024-08-13 LAB — BRAIN NATRIURETIC PEPTIDE: B Natriuretic Peptide: 82.8 pg/mL (ref 0.0–100.0)

## 2024-08-13 LAB — LACTIC ACID, PLASMA: Lactic Acid, Venous: 1.8 mmol/L (ref 0.5–1.9)

## 2024-08-13 LAB — TROPONIN I (HIGH SENSITIVITY)
Troponin I (High Sensitivity): 16 ng/L (ref <18)
Troponin I (High Sensitivity): 15 ng/L (ref <18)

## 2024-08-13 LAB — LIPASE, BLOOD: Lipase: 28 U/L (ref 11–51)

## 2024-08-13 MED ORDER — ONDANSETRON HCL 4 MG/2ML IJ SOLN
4.0000 mg | INTRAMUSCULAR | Status: AC
  Administered 2024-08-13: 4 mg via INTRAVENOUS
  Filled 2024-08-13: qty 2

## 2024-08-13 MED ORDER — IOHEXOL 300 MG/ML  SOLN
100.0000 mL | Freq: Once | INTRAMUSCULAR | Status: AC | PRN
  Administered 2024-08-13: 100 mL via INTRAVENOUS

## 2024-08-13 MED ORDER — SODIUM CHLORIDE 0.9 % IV BOLUS
250.0000 mL | Freq: Once | INTRAVENOUS | Status: AC
  Administered 2024-08-13: 250 mL via INTRAVENOUS

## 2024-08-13 MED ORDER — FENTANYL CITRATE PF 50 MCG/ML IJ SOSY
25.0000 ug | PREFILLED_SYRINGE | Freq: Once | INTRAMUSCULAR | Status: AC
  Administered 2024-08-13: 25 ug via INTRAVENOUS
  Filled 2024-08-13: qty 1

## 2024-08-13 MED ORDER — ALBUTEROL SULFATE (2.5 MG/3ML) 0.083% IN NEBU
2.5000 mg | INHALATION_SOLUTION | Freq: Once | RESPIRATORY_TRACT | Status: AC
  Administered 2024-08-13: 2.5 mg via RESPIRATORY_TRACT
  Filled 2024-08-13: qty 3

## 2024-08-13 NOTE — ED Triage Notes (Signed)
 Pt arrives via [facility] from home for chest pain and confusion. EMS states pain radiates into L arm. HX of an MI, 4 stents, and 2 bypasses. EMS gave 324 of aspirin .   EMS vitals: 117/71 BP 106 HR (afib) 82 CBG

## 2024-08-13 NOTE — ED Provider Notes (Signed)
 [facility] Provider Note    Event Date/Time   First MD Initiated Contact with Patient 08/13/24 1611     (approximate)   History   Chest pain  HPI  THIEN BERKA is a 68 y.o. male with type 2 diabetes CHF coronary disease COPD A-fib on Eliquis .  Compliance with his anticoagulant   Last night patient started to feel fatigued, tired.  No weakness in any 1 area.  He started also getting out since about mild chest pressure but not severe but its persisted throughout last night and today.  Does not radiate located in the mid chest.  He also associates 2 weeks of having moderate right upper abdominal pain and slight nausea no vomiting no change in bowel habits.  Saw his doctor and was planning to get an ultrasound possibly to his kidney.  Currently reports some mild sense of chest pressure.  His blood pressure he reports at home started last night running in the 80s, and also this morning.  EMS gave 324 mg aspirin .  Normotensive with EMS.  Patient's reports has been taking Lasix  and had some fluid taken off over the last week or 2 and he is wondering if maybe he is getting dehydrated.  He has not had any worsening of shortness of breath he always has some mild from his COPD and reports that his breathing feels normal to him.  The swelling that was in both of his legs has gone away  Comes in today primarily because of concerns of chest pressure, also low blood pressures at home and relates intermittent pain in the right upper quadrant or right flank for 2-week  Physical Exam   Triage Vital Signs: ED Triage Vitals  Encounter Vitals Group     BP      Girls Systolic BP Percentile      Girls Diastolic BP Percentile      Boys Systolic BP Percentile      Boys Diastolic BP Percentile      Pulse      Resp      Temp      Temp src      SpO2      Weight      Height      Head Circumference      Peak Flow      Pain Score      Pain Loc      Pain Education       Exclude from Growth Chart     Most recent vital signs: Vitals:   08/13/24 2200 08/13/24 2300  BP:  114/88  Pulse: 93 86  Resp: 17   Temp:    SpO2: 98% 97%     General: Awake, no distress.  Pleasant, conversant does not appear in acute distress or extremis. CV:  Good peripheral perfusion.  Normal tones and rate  Resp:  Normal effort.  Slight barrel chest appearance.  No accessory muscle use.  Speaks in full sentences without hypoxia.  Scant expiratory wheezing. [Patient does relate this feels like his typical breathing] Abd:  No distention.  Moderate tenderness in the right flank and right upper quadrant without rebound or guarding.  Patient reports is the area that he has been having discomfort for the last couple weeks Other:  No lower extreme edema venous cords or congestion   ED Results / Procedures / Treatments   Labs (all labs ordered are listed, but only abnormal results are displayed) Labs Reviewed  CBC - Abnormal; Notable for the following components:      Result Value   Hemoglobin 17.4 (*)    HCT 52.1 (*)    All other components within normal limits  COMPREHENSIVE METABOLIC PANEL WITH GFR - Abnormal; Notable for the following components:   Glucose, Bld 113 (*)    Total Bilirubin 1.8 (*)    All other components within normal limits  URINALYSIS, ROUTINE W REFLEX MICROSCOPIC - Abnormal; Notable for the following components:   Color, Urine YELLOW (*)    APPearance CLEAR (*)    Specific Gravity, Urine 1.039 (*)    Glucose, UA >=500 (*)    Ketones, ur 5 (*)    All other components within normal limits  LIPASE, BLOOD  BRAIN NATRIURETIC PEPTIDE  LACTIC ACID, PLASMA  TROPONIN I (HIGH SENSITIVITY)  TROPONIN I (HIGH SENSITIVITY)     EKG  Interpreted by me at 1630 heart rate 80 QRS 90 QTc 430 Atrial fibrillation, no evidence of acute ischemia.  Very mild nonspecific T wave abnormality.  Rate controlled  RADIOLOGY  CT ABDOMEN PELVIS W CONTRAST Result Date:  08/13/2024 CLINICAL DATA:  Right-sided flank pain right upper quadrant pain EXAM: CT ABDOMEN AND PELVIS WITH CONTRAST TECHNIQUE: Multidetector CT imaging of the abdomen and pelvis was performed using the standard protocol following bolus administration of intravenous contrast. RADIATION DOSE REDUCTION: This exam was performed according to the departmental dose-optimization program which includes automated exposure control, adjustment of the mA and/or kV according to patient size and/or use of iterative reconstruction technique. CONTRAST:  OMNIPAQUE  IOHEXOL  300 MG/ML  SOLN COMPARISON:  CT 05/29/2024, 03/11/2024 FINDINGS: Lower chest: Lung bases demonstrate no acute airspace disease. Hepatobiliary: Cholecystectomy. No biliary dilatation. No focal hepatic abnormality. Pancreas: Unremarkable. No pancreatic ductal dilatation or surrounding inflammatory changes. Spleen: Normal in size without focal abnormality. Adrenals/Urinary Tract: Stable small adrenal gland nodules measuring to 14 mm on the left, no imaging follow-up recommended. Kidneys show no hydronephrosis. The bladder is unremarkable Stomach/Bowel: The stomach is within normal limits. There is no dilated small bowel. No acute bowel wall thickening. Negative appendix. Mild diverticular disease of the left colon Vascular/Lymphatic: Moderate aortic atherosclerosis. No aneurysm. No suspicious lymph nodes. Reproductive: Mildly prominent heterogeneous prostate Other: Negative for pelvic effusion or free air Musculoskeletal: No acute or suspicious osseous abnormality. IMPRESSION: 1. No CT evidence for acute intra-abdominal or pelvic abnormality. 2. Mild diverticular disease of the left colon without acute inflammatory process. 3. Aortic atherosclerosis. Aortic Atherosclerosis (ICD10-I70.0). Electronically Signed   By: Luke Bun M.D.   On: 08/13/2024 18:11   DG Chest Portable 1 View Result Date: 08/13/2024 CLINICAL DATA:  Chest pain and confusion. EXAM:  PORTABLE CHEST 1 VIEW COMPARISON:  August 01, 2024 FINDINGS: Multiple sternal wires and vascular clips are seen. The heart size and mediastinal contours are within normal limits. A coronary artery stent is noted. Mild linear atelectasis is seen within the left lung base. No pleural effusion or pneumothorax is identified. Multilevel degenerative changes seen throughout the thoracic spine. IMPRESSION: 1. Evidence of prior median sternotomy/CABG. 2. Mild left basilar linear atelectasis. Electronically Signed   By: Suzen Dials M.D.   On: 08/13/2024 16:59    CT imaging reviewed by me grossly negative for acute intra-abdominal pathology   PROCEDURES:  Critical Care performed: No  Procedures   MEDICATIONS ORDERED IN ED: Medications  albuterol  (PROVENTIL ) (2.5 MG/3ML) 0.083% nebulizer solution 2.5 mg (2.5 mg Nebulization Given 08/13/24 1639)  ondansetron  (ZOFRAN ) injection 4 mg (  4 mg Intravenous Given 08/13/24 1629)  fentaNYL  (SUBLIMAZE ) injection 25 mcg (25 mcg Intravenous Given 08/13/24 1631)  iohexol  (OMNIPAQUE ) 300 MG/ML solution 100 mL (100 mLs Intravenous Contrast Given 08/13/24 1725)  fentaNYL  (SUBLIMAZE ) injection 25 mcg (25 mcg Intravenous Given 08/13/24 1805)  sodium chloride  0.9 % bolus 250 mL (0 mLs Intravenous Stopped 08/13/24 1833)  ondansetron  (ZOFRAN ) injection 4 mg (4 mg Intravenous Given 08/13/24 2128)     IMPRESSION / MDM / ASSESSMENT AND PLAN / ED COURSE  I reviewed the triage vital signs and the nursing notes.                              Differential diagnosis includes, but is not limited to, possible ACS, demand ischemia in the setting of reported hypotension, dehydration in setting of recent diuretic use, also having right upper quadrant right flank discomfort.  Given associated pain 2 weeks duration and tenderness on exam will obtain CT imaging to evaluate for acute gross pathology.  Patient has a known history of CHF COPD.  Appears to be quite low risk for PE he is  not tachycardic he is not hypoxic, he reports compliance with his anticoagulant and does not have any pleuritic type pain.    Patient's presentation is most consistent with acute complicated illness / injury requiring diagnostic workup.    The patient is on the cardiac monitor to evaluate for evidence of arrhythmia and/or significant heart rate changes.  Clinical Course as of 08/13/24 2359  Sun Aug 13, 2024  1755 Renal function preserved.  BNP normalized.  Chest x-ray clear.  Given the patient's report of chest pressure and lightheadedness, also in review of in light of his clinical history of recent Lasix  use I suspect he may be slightly dehydrated.  Will give small fluid bolus continue to observe.  Patient relates ongoing chest discomfort.  Thus far cardiac workup negative.  Awaiting results of CT imaging.  LFTs normal with exception to very minimally elevated bilirubin [MQ]  2203 No further chest pain.  Patient feels better.  Resting comfortably.  Reports still lingering pain in his right upper quadrant but no severe pain.  Reports he has a cardiology appointment on Tuesday.  He would feel comfortable with the plan to discharge to home with return precautions.  At this juncture he appears improved workup very reassuring troponins normal no further chest pain.  I suspect he may have been slightly overdiuresed based on the history of low blood pressures, now improved.  Hydrated mildly here as he does carry a history of congestive heart failure as well. [MQ]  2204 CT without acute departure.  Abdominal workup cardiac workup very reassuring. [MQ]    Clinical Course User Index [MQ] Dicky Anes, MD   ----------------------------------------- 11:56 PM on 08/13/2024 ----------------------------------------- Discussed with the patient's cardiology team Dr. Wilburn, and he recommends patient be observed for chest pain given high risk and history.  He has reviewed his recent records.  Advises  cardiology would like to have an echocardiogram performed tomorrow instead of waiting outpatient Tuesday and [facility] cardiology will provide consult  Discussed with patient understand agreeable with plan for admission.  Acepted Dr. Manfred  FINAL CLINICAL IMPRESSION(S) / ED DIAGNOSES   Final diagnoses:  Chest pain with high risk for cardiac etiology  Dehydration, mild     Rx / DC Orders   ED Discharge Orders     None  Note:  This document was prepared using Dragon voice recognition software and may include unintentional dictation errors.   Dicky Anes, MD 08/13/24 [phone]

## 2024-08-13 NOTE — H&P (Incomplete)
 History and Physical    Patient: Lawrence [name] FMW:[id number] DOB: 26-Apr-1956 DOA: 08/13/2024 DOS: the patient was seen and examined on 08/14/2024 PCP: [name], MD  Patient coming from: Home  Chief Complaint:  Chief Complaint  Patient presents with   Chest Pain   HPI: Lawrence [name] is a 67 y.o. male with medical history significant of hypertension, hyperlipidemia, CAD (s/p stent x 4 and CABG  2016), chronic combined systolic and diastolic CHF,  A- fib on Eliquis , PSVT, type 2 diabetes mellitus, COPD on 2L O2, OSA not using CPAP, dCHF, anxiety, celiac disease, pancreatitis, who presents to the emergency department due to chest pain which started last night.  Chest pain was sharp, midsternal, rated as 8/10 on pain scale and was associated with tiredness and fatigue.  Chest pain started to radiate to left shoulder around 3 AM and this continued to worsen as the day progressed.  Patient also complaining of low BP with SBP in the 80s at home, he thinks that he was possibly dehydrated since he has been compliant with his Lasix  and has had some fluid taken off over the last week or 2.  Patient has extensive cardiac history, so, he activated EMS, on arrival of EMS team, aspirin  325 mg x 1 was given and his BP was noted in normal range.  He was taken to the ED for further evaluation and management.   Patient also complained of right upper abdominal pain which has been ongoing for about 2 weeks and he followed up with his PCP who had plan to have an outpatient ultrasound done.  ED Course:  In the emergency department, he was hemodynamically stable.  Workup in the ED showed normal CBC except for elevated hemoglobin at 17.4, hematocrit 52.1, BMP was normal except for blood glucose of 113, troponin x 2 was normal, lipase 28, BNP 82.8, urinalysis was positive for glycosuria. CT abdomen and pelvis with contrast showed no CT evidence for acute intra-abdominal or pelvic abnormality.  Mild diverticular  disease of the left colon without acute inflammatory process. Chest x-ray showed evidence of prior median sternotomy/CABG.  Mild left basilar linear atelectasis. Breathing treatment was provided, fentanyl  was given IV hydration  250 mL was provided. Cardiologist (Dr. Wilburn) was consulted and recommended observing patient overnight for chest pain, do an echocardiogram in the morning (in leu of pending outpatient echocardiogram on Tuesday -9/30). [facility] was asked to admit patient.  Review of Systems: Review of systems as noted in the HPI. All other systems reviewed and are negative.   Past Medical History:  Diagnosis Date   A-fib (HCC)    Anemia    Anginal pain    Anxiety    Arthritis    Asthma    Atrial fibrillation, chronic (HCC) 10/19/2023   CAD (coronary artery disease)    a. 2002 CABGx2 (LIMA->LAD, VG->VG->OM1);  b. 09/2012 DES->OM;  c. 03/2015 PTCA of LAD [facility]) in setting of atretic LIMA; d. 05/2015 Cath [facility]): nonobs dzs; e. 06/2015 Cath ([facility]): LM nl, LAD 45p/d ISR, 50d, D1/2 small, LCX 50p/d ISR, OM1 70ost, 30 ISR, VG->OM1 50ost, 54m, LIMA->LAD 99p/d - atretic, RCA dom, nl; f.cath 10/16: 40-50%(FFR 0.90) pLAD, 75% (FFR 0.77) mLAD s/p PCI/DES, oRCA 40% (FFR0.95)   Cancer (HCC)    SKIN CANCER ON BACK   Celiac disease    Chronic diastolic CHF (congestive heart failure) (HCC)    a. 06/2009 Echo: EF 60-65%, Gr 1 DD, triv AI, mildly dil LA, nl RV.  COPD (chronic obstructive pulmonary disease) (HCC)    a. Chronic bronchitis and emphysema.   DDD (degenerative disc disease), lumbar    Diverticulosis    Dysrhythmia    Essential hypertension    GERD (gastroesophageal reflux disease)    History of hiatal hernia    History of kidney stones    H/O   History of tobacco abuse    a. Quit 2014.   Myocardial infarction [facility]) 2002   4 STENTS   Pancreatitis    PSVT (paroxysmal supraventricular tachycardia)    a. 10/2012 Noted on Zio Patch.   RLL pneumonia 05/30/2024   Sleep apnea    LOST  CORD TO CPAP -ONLY 02 @ BEDTIME   Tubular adenoma of colon    Type II diabetes mellitus (HCC)    Past Surgical History:  Procedure Laterality Date   BYPASS GRAFT     CARDIAC CATHETERIZATION N/A 07/12/2015   rocedure: Left Heart Cath and Cors/Grafts Angiography;  Surgeon: Victory LELON Sharps, MD;  Location: [facility] INVASIVE CV LAB;  Service: Cardiovascular;  Laterality: N/A;   CARDIAC CATHETERIZATION Right 10/07/2015   Procedure: Left Heart Cath and Cors/Grafts Angiography;  Surgeon: Denyse DELENA Bathe, MD;  Location: [facility] INVASIVE CV LAB;  Service: Cardiovascular;  Laterality: Right;   CARDIAC CATHETERIZATION N/A 04/06/2016   Procedure: Left Heart Cath and Coronary Angiography;  Surgeon: Cara JONETTA Lovelace, MD;  Location: [facility] INVASIVE CV LAB;  Service: Cardiovascular;  Laterality: N/A;   CARDIAC CATHETERIZATION  04/06/2016   Procedure: Bypass Graft Angiography;  Surgeon: Cara JONETTA Lovelace, MD;  Location: [facility] INVASIVE CV LAB;  Service: Cardiovascular;;   CARDIAC CATHETERIZATION N/A 11/02/2016   Procedure: Left Heart Cath and Cors/Grafts Angiography and possible PCI;  Surgeon: Cara JONETTA Lovelace, MD;  Location: [facility] INVASIVE CV LAB;  Service: Cardiovascular;  Laterality: N/A;   CARDIAC CATHETERIZATION N/A 11/02/2016   Procedure: Coronary Stent Intervention;  Surgeon: Cara JONETTA Lovelace, MD;  Location: [facility] INVASIVE CV LAB;  Service: Cardiovascular;  Laterality: N/A;   CARDIOVERSION N/A 06/29/2022   Procedure: CARDIOVERSION;  Surgeon: Hester Wolm PARAS, MD;  Location: [facility] ORS;  Service: Cardiovascular;  Laterality: N/A;   CHOLECYSTECTOMY     CIRCUMCISION N/A 06/09/2019   Procedure: CIRCUMCISION ADULT;  Surgeon: Francisca Redell BROCKS, MD;  Location: [facility] ORS;  Service: Urology;  Laterality: N/A;   COLONOSCOPY WITH PROPOFOL  N/A 04/01/2018   Procedure: COLONOSCOPY WITH PROPOFOL ;  Surgeon: Viktoria Lamar DASEN, MD;  Location[facility] ENDOSCOPY;  Service: Endoscopy;  Laterality: N/A;   COLONOSCOPY WITH PROPOFOL  N/A 05/01/2023    Procedure: COLONOSCOPY WITH PROPOFOL ;  Surgeon: Unk Corinn Skiff, MD;  Location: [facility] ENDOSCOPY;  Service: Gastroenterology;  Laterality: N/A;   ESOPHAGEAL DILATION     ESOPHAGOGASTRODUODENOSCOPY (EGD) WITH PROPOFOL  N/A 04/01/2018   Procedure: ESOPHAGOGASTRODUODENOSCOPY (EGD) WITH PROPOFOL ;  Surgeon: Viktoria Lamar DASEN, MD;  Location: [facility] ENDOSCOPY;  Service: Endoscopy;  Laterality: N/A;   ESOPHAGOGASTRODUODENOSCOPY (EGD) WITH PROPOFOL  N/A 05/01/2023   Procedure: ESOPHAGOGASTRODUODENOSCOPY (EGD) WITH PROPOFOL ;  Surgeon: Unk Corinn Skiff, MD;  Location: [facility] ENDOSCOPY;  Service: Gastroenterology;  Laterality: N/A;   GIVENS CAPSULE STUDY  05/01/2023   Procedure: GIVENS CAPSULE STUDY;  Surgeon: Unk Corinn Skiff, MD;  Location: [facility] ENDOSCOPY;  Service: Gastroenterology;;   LEFT HEART CATH AND CORS/GRAFTS ANGIOGRAPHY N/A 06/12/2019   Procedure: LEFT HEART CATH AND CORS/GRAFTS ANGIOGRAPHY;  Surgeon: Bosie Vinie DELENA, MD;  Location: [facility] INVASIVE CV LAB;  Service: Cardiovascular;  Laterality: N/A;   LEFT HEART CATH AND CORS/GRAFTS ANGIOGRAPHY N/A 03/11/2020  Procedure: LEFT HEART CATH AND CORS/GRAFTS ANGIOGRAPHY;  Surgeon: Ammon Blunt, MD;  Location: [facility] INVASIVE CV LAB;  Service: Cardiovascular;  Laterality: N/A;   LEFT HEART CATH AND CORS/GRAFTS ANGIOGRAPHY N/A 05/01/2021   Procedure: LEFT HEART CATH AND CORS/GRAFTS ANGIOGRAPHY;  Surgeon: Hester Wolm PARAS, MD;  Location: [facility] INVASIVE CV LAB;  Service: Cardiovascular;  Laterality: N/A;   LEFT HEART CATH AND CORS/GRAFTS ANGIOGRAPHY N/A 11/02/2022   Procedure: LEFT HEART CATH AND CORS/GRAFTS ANGIOGRAPHY;  Surgeon: Florencio Cara BIRCH, MD;  Location: [facility] INVASIVE CV LAB;  Service: Cardiovascular;  Laterality: N/A;   TONSILLECTOMY     VASCULAR SURGERY      Social History:  reports that he quit smoking about 11 years ago. His smoking use included cigarettes. He started smoking about 61 years ago. He has a 150 pack-year smoking history. He  has never used smokeless tobacco. He reports that he does not currently use drugs after having used the following drugs: Marijuana. He reports that he does not drink alcohol.   Allergies  Allergen Reactions   Prednisone  Hypertension and Other (See Comments)    Patient said they used to think it put him in A fib but it was discovered that it doesn't. I stay in A fib all the time,. He tolerates Prednisone    Demerol  [Meperidine Hcl]    Demerol [Meperidine] Hives   Morphine     Sulfa Antibiotics Hives   Albuterol  Other (See Comments) and Palpitations    Pt currently uses this medication.   Empagliflozin  Other (See Comments) and Hives    Perineal pain  Other Reaction(s): Other (See Comments)   Morphine  Sulfate Nausea And Vomiting, Rash and Other (See Comments)    Pt states that he is only allergic to the tablet form of this medication.      Family History  Problem Relation Age of Onset   Heart attack Mother    Depression Mother    Heart disease Mother    COPD Mother    Hypertension Mother    Heart attack Father    Diabetes Father    Depression Father    Heart disease Father    Cirrhosis Father    Parkinson's disease Brother      Prior to Admission medications   Medication Sig Start Date End Date Taking? Authorizing Provider  Accu-Chek Softclix Lancets lancets Use as instructed to check sugar daily for type 2 diabetes. 03/03/21   [name], MD  albuterol  (PROVENTIL ) (2.5 MG/3ML) 0.083% nebulizer solution Take 3 mLs (2.5 mg total) by nebulization every 6 (six) hours as needed for wheezing or shortness of breath. 05/26/24   [name], MD  allopurinol  (ZYLOPRIM ) 300 MG tablet TAKE 1 TABLET BY MOUTH TWICE A DAY 04/12/24   [name], MD  ALPRAZolam  (XANAX ) 1 MG tablet TAKE 1 TABLET BY MOUTH 3 TIMES A DAY AS NEEDED 07/21/24   [name], MD  apixaban  (ELIQUIS ) 5 MG TABS tablet Take 1 tablet (5 mg total) by mouth 2 (two) times daily. 10/04/23   [name],  MD  atorvastatin  (LIPITOR ) 80 MG tablet TAKE 1 TABLET BY MOUTH AT BEDTIME 07/15/23   [name], MD  Blood Glucose Monitoring Suppl (ACCU-CHEK GUIDE) w/Device KIT Use to check blood sugars as directed 12/01/23   [name], MD  Blood Pressure Monitor DEVI Use to check blood pressure daily 07/27/24   [name], MD  BREZTRI  AEROSPHERE 160-9-4.8 MCG/ACT AERO inhaler INHALE 2 PUFFS BY  MOUTH INTO THE LUNGS 2TIMES DAILY 06/05/24   [name], MD  cetirizine  (ZYRTEC ) 10 MG tablet Take 1 tablet (10 mg total) by mouth at bedtime. 03/31/24   [name], MD  cholecalciferol  (VITAMIN D3) 25 MCG (1000 UNIT) tablet Take 1,000 Units by mouth daily.    [provider]  Cyanocobalamin  (VITAMIN B-12) 1000 MCG SUBL Place 1 tablet under the tongue daily.    [provider]  dapagliflozin  propanediol (FARXIGA ) 10 MG TABS tablet TAKE 1 TABLET BY MOUTH DAILY 03/28/24   [name], MD  famotidine  (PEPCID ) 20 MG tablet Take 1 tablet (20 mg total) by mouth 2 (two) times daily. 04/10/24   [name], MD  furosemide  (LASIX ) 20 MG tablet Take 1 tablet (20 mg total) by mouth daily as needed. 08/08/24   [name], MD  glucose blood (ACCU-CHEK GUIDE) test strip Use as instructed to check sugar daily for type 2 diabetes. 03/03/21   [name], MD  iron  polysaccharides (FERREX 150) 150 MG capsule Take one capsul every Monday, Wednesday, and Friday 06/09/24   [name], MD  isosorbide  mononitrate (IMDUR ) 120 MG 24 hr tablet Take 1 tablet (120 mg total) by mouth daily. 02/10/24 02/09/25  Von Bellis, MD  lithium  carbonate 150 MG capsule Take 150 mg by mouth 2 (two) times daily. 03/24/24   [provider]  metFORMIN  (GLUCOPHAGE -XR) 500 MG 24 hr tablet TAKE 1 TABLET BY MOUTH DAILY WITH EVENING MEAL 07/11/24   [name], MD  methocarbamol  (ROBAXIN ) 500 MG tablet TAKE 1 TABLET BY MOUTH EVERY 8 HOURS AS NEEDED FOR MUSCLE SPASMS 01/13/24   [name], MD   metoprolol  succinate (TOPROL -XL) 25 MG 24 hr tablet Take 1 tablet (25 mg total) by mouth daily. 06/14/24   Alexander, Natalie, DO  nitroGLYCERIN  (NITROSTAT ) 0.4 MG SL tablet Place 1 tablet (0.4 mg total) under the tongue every 5 (five) minutes as needed for chest pain (Do not take if blood pressure is low, systolic BP<110). 10/21/23   [name], MD  omega-3 acid ethyl esters (LOVAZA ) 1 g capsule TAKE 4 CAPSULES (4 GRAMS TOTAL) BY MOUTHDAILY 03/02/24   [name], MD  ondansetron  (ZOFRAN ) 4 MG tablet Take 1 tablet (4 mg total) by mouth every 8 (eight) hours as needed for nausea or vomiting. 06/23/24   [name], MD  oxyCODONE -acetaminophen  (PERCOCET) 10-325 MG tablet TAKE 1 TABLET BY MOUTH EVERY 4 HOURS AS NEEDED FOR PAIN 07/21/24   [name], MD  OXYGEN  Inhale 2 L into the lungs at bedtime as needed (for shortness of breath).    [provider]  pantoprazole  (PROTONIX ) 40 MG tablet TAKE 1 TABLET BY MOUTH 2 TIMES DAILY (30MINUTES BEFORE A MEAL) 01/12/24   [name], MD  ranolazine  (RANEXA ) 1000 MG SR tablet Take 1 tablet (1,000 mg total) by mouth 2 (two) times daily. 08/05/23   Darci Pore, MD  sacubitril -valsartan  (ENTRESTO ) 49-51 MG Take 1 tablet by mouth 2 (two) times daily. 07/27/24   [name], MD  spironolactone  (ALDACTONE ) 25 MG tablet Take 1 tablet (25 mg total) by mouth daily. 08/12/24   [name], MD  traZODone  (DESYREL ) 150 MG tablet Take 1 tablet (150 mg total) by mouth at bedtime as needed for sleep. 03/31/24   [name], MD  TRINTELLIX  10 MG TABS tablet Take 10 mg by mouth daily.    [provider]    Physical  Exam: BP (!) 117/94 (BP Location: Left Arm)   Pulse 84   Temp 97.8 F (36.6 C) (Oral)   Resp 20   Ht 5' 7 (1.702 m)   Wt 83 kg   SpO2 97%   BMI 28.66 kg/m   General: 68 y.o. year-old male well developed well nourished in no acute distress.  Alert and oriented x3. HEENT: NCAT, EOMI, dry mucous  membranes Neck: Supple, trachea medial Cardiovascular: Regular rate and rhythm with no rubs or gallops.  No thyromegaly or JVD noted.  No lower extremity edema. 2/4 pulses in all 4 extremities. Respiratory: Clear to auscultation with no wheezes or rales. Good inspiratory effort. Abdomen: Soft, mild tenderness to palpation of RUQ and right CVA without guarding.  Nondistended with normal bowel sounds x4 quadrants. Muskuloskeletal: No cyanosis, clubbing or edema noted bilaterally Neuro: CN II-XII intact, strength 5/5 x 4, sensation, reflexes intact Skin: No ulcerative lesions noted or rashes Psychiatry: Judgement and insight appear normal. Mood is appropriate for condition and setting          Labs on Admission:  Basic Metabolic Panel: Recent Labs  Lab 08/11/24 1404 08/13/24 1617  NA 143 139  K 4.8 4.4  CL 103 100  CO2 24 27  GLUCOSE 105* 113*  BUN 17 16  CREATININE 1.14 1.00  CALCIUM  10.2 9.6   Liver Function Tests: Recent Labs  Lab 08/11/24 1404 08/13/24 1617  AST 17 24  ALT 21 22  ALKPHOS 77 54  BILITOT 0.9 1.8*  PROT 6.5 6.6  ALBUMIN 4.5 3.8   Recent Labs  Lab 08/13/24 1617  LIPASE 28   No results for input(s): AMMONIA in the last 168 hours. CBC: Recent Labs  Lab 08/13/24 1617  WBC 9.7  HGB 17.4*  HCT 52.1*  MCV 91.1  PLT 182   Cardiac Enzymes: No results for input(s): CKTOTAL, CKMB, CKMBINDEX, TROPONINI in the last 168 hours.  BNP (last 3 results) Recent Labs    06/30/24 2124 08/01/24 1916 08/13/24 1617  BNP 297.6* 165.3* 82.8    ProBNP (last 3 results) No results for input(s): PROBNP in the last 8760 hours.  CBG: No results for input(s): GLUCAP in the last 168 hours.  Radiological Exams on Admission: CT ABDOMEN PELVIS W CONTRAST Result Date: 08/13/2024 CLINICAL DATA:  Right-sided flank pain right upper quadrant pain EXAM: CT ABDOMEN AND PELVIS WITH CONTRAST TECHNIQUE: Multidetector CT imaging of the abdomen and pelvis was  performed using the standard protocol following bolus administration of intravenous contrast. RADIATION DOSE REDUCTION: This exam was performed according to the departmental dose-optimization program which includes automated exposure control, adjustment of the mA and/or kV according to patient size and/or use of iterative reconstruction technique. CONTRAST:  OMNIPAQUE  IOHEXOL  300 MG/ML  SOLN COMPARISON:  CT 05/29/2024, 03/11/2024 FINDINGS: Lower chest: Lung bases demonstrate no acute airspace disease. Hepatobiliary: Cholecystectomy. No biliary dilatation. No focal hepatic abnormality. Pancreas: Unremarkable. No pancreatic ductal dilatation or surrounding inflammatory changes. Spleen: Normal in size without focal abnormality. Adrenals/Urinary Tract: Stable small adrenal gland nodules measuring to 14 mm on the left, no imaging follow-up recommended. Kidneys show no hydronephrosis. The bladder is unremarkable Stomach/Bowel: The stomach is within normal limits. There is no dilated small bowel. No acute bowel wall thickening. Negative appendix. Mild diverticular disease of the left colon Vascular/Lymphatic: Moderate aortic atherosclerosis. No aneurysm. No suspicious lymph nodes. Reproductive: Mildly prominent heterogeneous prostate Other: Negative for pelvic effusion or free air Musculoskeletal: No acute or suspicious osseous abnormality. IMPRESSION:  1. No CT evidence for acute intra-abdominal or pelvic abnormality. 2. Mild diverticular disease of the left colon without acute inflammatory process. 3. Aortic atherosclerosis. Aortic Atherosclerosis (ICD10-I70.0). Electronically Signed   By: Luke Bun M.D.   On: 08/13/2024 18:11   DG Chest Portable 1 View Result Date: 08/13/2024 CLINICAL DATA:  Chest pain and confusion. EXAM: PORTABLE CHEST 1 VIEW COMPARISON:  August 01, 2024 FINDINGS: Multiple sternal wires and vascular clips are seen. The heart size and mediastinal contours are within normal limits. A  coronary artery stent is noted. Mild linear atelectasis is seen within the left lung base. No pleural effusion or pneumothorax is identified. Multilevel degenerative changes seen throughout the thoracic spine. IMPRESSION: 1. Evidence of prior median sternotomy/CABG. 2. Mild left basilar linear atelectasis. Electronically Signed   By: Suzen Dials M.D.   On: 08/13/2024 16:59    EKG: I independently viewed the EKG done and my findings are as followed: Atrial fibrillation with rate control  Assessment/Plan Present on Admission:  Chest pain  Coronary artery disease  Chronic combined systolic (congestive) and diastolic (congestive) heart failure (HCC)  Mixed hyperlipidemia  COPD (chronic obstructive pulmonary disease) (HCC)  Essential hypertension  Atrial fibrillation, chronic (HCC)  Depression with anxiety  Bipolar disorder (HCC)  Active Problems:   Chest pain   Chronic combined systolic (congestive) and diastolic (congestive) heart failure (HCC)   Mixed hyperlipidemia   COPD (chronic obstructive pulmonary disease) (HCC)   Essential hypertension   Depression with anxiety   Bipolar disorder (HCC)   Type 2 diabetes mellitus without complications (HCC)   Atrial fibrillation, chronic (HCC)   Coronary artery disease   Right upper quadrant abdominal pain   Elevated hemoglobin  Chest pain Continue telemetry  Troponins x 2 -16 > 15 EKG personally reviewed showed atrial fibrillation with rate controlled Echocardiogram in the morning per cardiologist's recommendation N.p.o. pending cardiology consult Cardiology (Dr. Wilburn) was consulted and cardiology team will consult on patient in the morning per EDP  Right upper quadrant abdominal pain CT abdomen and pelvis with contrast showed no intra-abdominal or pelvic abnormality Continue oxycodone  as needed  Elevated hemoglobin/hematocrit Hemoglobin/hematocrit 17.4/52.1, this may be due to hemoconcentration Gentle hydration was provided  in the ED  CAD (coronary artery disease)  Troponin x 2 was normal Continue Eliquis , Imdur , Lipitor  Coreg  will be temporarily held pending cardiologist evaluation of patient's chest pain (in anticipation for possible stress test)  As needed nitroglycerin    Chronic combined systolic (congestive) and diastolic (congestive) heart failure Echocardiogram done on 06/10/2024 showed LVEF of 45 to 50%.  LV with global hypokinesis.  Mild concentric LVH.  G1 DD. BNP 82.8 Patient does not appear to be fluid overloaded Continue total input/output, daily weights and fluid restriction Continue home meds Lasix  and spironolactone  will be temporarily held at this time due to dehydration Echocardiogram in the morning  Mixed hyperlipidemia  Continue Lipitor    COPD (chronic obstructive pulmonary disease)  Continue home meds   Essential hypertension Continue Imdur , Entresto    Atrial fibrillation, chronic  EKG personally reviewed showed A-fib with rate controlled Continue Eliquis  Consider restarting patient's Coreg  after her cardiologist evaluation for chest pain   Anxiety and depression Continue home medications   Bipolar disorder Continue lithium   Type 2 diabetes mellitus Hemoglobin A1c on 08/11/24 was 6.8 Continue Farxiga  Continue ISS and hypoglycemia protocol  GERD Continue Pepcid , Protonix       DVT prophylaxis: Eliquis   Code Status: Full code  Family Communication: None at bedside  Consults:  Cardiology (by EDP)   Severity of Illness: The appropriate patient status for this patient is OBSERVATION. Observation status is judged to be reasonable and necessary in order to provide the required intensity of service to ensure the patient's safety. The patient's presenting symptoms, physical exam findings, and initial radiographic and laboratory data in the context of their medical condition is felt to place them at decreased risk for further clinical deterioration. Furthermore, it is  anticipated that the patient will be medically stable for discharge from the hospital within 2 midnights of admission.   Author: Taegen Delker, DO 08/14/2024 3:02 AM  For on call review [email or web address].

## 2024-08-14 ENCOUNTER — Observation Stay: Admit: 2024-08-14 | Discharge: 2024-08-14 | Disposition: A | Attending: Internal Medicine

## 2024-08-14 DIAGNOSIS — R079 Chest pain, unspecified: Secondary | ICD-10-CM | POA: Diagnosis not present

## 2024-08-14 DIAGNOSIS — D582 Other hemoglobinopathies: Secondary | ICD-10-CM | POA: Insufficient documentation

## 2024-08-14 DIAGNOSIS — I25119 Atherosclerotic heart disease of native coronary artery with unspecified angina pectoris: Secondary | ICD-10-CM

## 2024-08-14 DIAGNOSIS — R1011 Right upper quadrant pain: Secondary | ICD-10-CM | POA: Insufficient documentation

## 2024-08-14 DIAGNOSIS — R0789 Other chest pain: Secondary | ICD-10-CM | POA: Diagnosis not present

## 2024-08-14 LAB — COMPREHENSIVE METABOLIC PANEL WITH GFR
ALT: 24 U/L (ref 0–44)
AST: 26 U/L (ref 15–41)
Albumin: 3.4 g/dL — ABNORMAL LOW (ref 3.5–5.0)
Alkaline Phosphatase: 50 U/L (ref 38–126)
Anion gap: 10 (ref 5–15)
BUN: 17 mg/dL (ref 8–23)
CO2: 27 mmol/L (ref 22–32)
Calcium: 9.3 mg/dL (ref 8.9–10.3)
Chloride: 101 mmol/L (ref 98–111)
Creatinine, Ser: 1.08 mg/dL (ref 0.61–1.24)
GFR, Estimated: >60 mL/min (ref >60)
Glucose, Bld: 178 mg/dL — ABNORMAL HIGH (ref 70–99)
Potassium: 4.3 mmol/L (ref 3.5–5.1)
Sodium: 138 mmol/L (ref 135–145)
Total Bilirubin: 1.5 mg/dL — ABNORMAL HIGH (ref 0.0–1.2)
Total Protein: 6.1 g/dL — ABNORMAL LOW (ref 6.5–8.1)

## 2024-08-14 LAB — CBC
HCT: 50.1 % (ref 39.0–52.0)
Hemoglobin: 16.5 g/dL (ref 13.0–17.0)
MCH: 30 pg (ref 26.0–34.0)
MCHC: 32.9 g/dL (ref 30.0–36.0)
MCV: 91.1 fL (ref 80.0–100.0)
Platelets: 155 K/uL (ref 150–400)
RBC: 5.5 MIL/uL (ref 4.22–5.81)
RDW: 14.4 % (ref 11.5–15.5)
WBC: 6.8 K/uL (ref 4.0–10.5)
nRBC: 0 % (ref 0.0–0.2)

## 2024-08-14 LAB — CBG MONITORING, ED
Glucose-Capillary: 91 mg/dL (ref 70–99)
Glucose-Capillary: 117 mg/dL — ABNORMAL HIGH (ref 70–99)
Glucose-Capillary: 150 mg/dL — ABNORMAL HIGH (ref 70–99)

## 2024-08-14 LAB — GLUCOSE, CAPILLARY
Glucose-Capillary: 109 mg/dL — ABNORMAL HIGH (ref 70–99)
Glucose-Capillary: 179 mg/dL — ABNORMAL HIGH (ref 70–99)

## 2024-08-14 LAB — PHOSPHORUS: Phosphorus: 3.6 mg/dL (ref 2.5–4.6)

## 2024-08-14 LAB — HIV ANTIBODY (ROUTINE TESTING W REFLEX): HIV Screen 4th Generation wRfx: NONREACTIVE

## 2024-08-14 LAB — MAGNESIUM: Magnesium: 2.1 mg/dL (ref 1.7–2.4)

## 2024-08-14 MED ORDER — INSULIN ASPART 100 UNIT/ML IJ SOLN
0.0000 [IU] | INTRAMUSCULAR | Status: DC
  Administered 2024-08-14: 3 [IU] via SUBCUTANEOUS
  Administered 2024-08-14: 2 [IU] via SUBCUTANEOUS
  Administered 2024-08-15: 3 [IU] via SUBCUTANEOUS
  Filled 2024-08-14 (×3): qty 1

## 2024-08-14 MED ORDER — ACETAMINOPHEN 325 MG PO TABS: 650.0000 mg | ORAL_TABLET | Freq: Four times a day (QID) | ORAL | Status: DC | PRN

## 2024-08-14 MED ORDER — BUDESON-GLYCOPYRROL-FORMOTEROL 160-9-4.8 MCG/ACT IN AERO
2.0000 | INHALATION_SPRAY | Freq: Two times a day (BID) | RESPIRATORY_TRACT | Status: DC
  Administered 2024-08-15 (×2): 2 via RESPIRATORY_TRACT
  Filled 2024-08-14 (×2): qty 5.9

## 2024-08-14 MED ORDER — ACETAMINOPHEN 650 MG RE SUPP: 650.0000 mg | Freq: Four times a day (QID) | RECTAL | Status: DC | PRN

## 2024-08-14 MED ORDER — OXYCODONE HCL 5 MG PO TABS
5.0000 mg | ORAL_TABLET | ORAL | Status: DC | PRN
  Administered 2024-08-14 – 2024-08-15 (×6): 5 mg via ORAL
  Filled 2024-08-14 (×6): qty 1

## 2024-08-14 MED ORDER — RANOLAZINE ER 500 MG PO TB12
500.0000 mg | ORAL_TABLET | Freq: Two times a day (BID) | ORAL | Status: DC
Start: 2024-08-14 — End: 2024-08-15
  Administered 2024-08-14 – 2024-08-15 (×3): 500 mg via ORAL
  Filled 2024-08-14 (×4): qty 1

## 2024-08-14 MED ORDER — APIXABAN 5 MG PO TABS
5.0000 mg | ORAL_TABLET | Freq: Two times a day (BID) | ORAL | Status: DC
Start: 2024-08-14 — End: 2024-08-15
  Administered 2024-08-14 – 2024-08-15 (×3): 5 mg via ORAL
  Filled 2024-08-14 (×3): qty 1

## 2024-08-14 MED ORDER — SACUBITRIL-VALSARTAN 49-51 MG PO TABS
1.0000 | ORAL_TABLET | Freq: Two times a day (BID) | ORAL | Status: DC
  Administered 2024-08-14 – 2024-08-15 (×3): 1 via ORAL
  Filled 2024-08-14 (×4): qty 1

## 2024-08-14 MED ORDER — ATORVASTATIN CALCIUM 80 MG PO TABS
80.0000 mg | ORAL_TABLET | Freq: Every day | ORAL | Status: DC
  Administered 2024-08-14: 80 mg via ORAL
  Filled 2024-08-14: qty 1

## 2024-08-14 MED ORDER — ONDANSETRON HCL 4 MG PO TABS: 4.0000 mg | ORAL_TABLET | Freq: Four times a day (QID) | ORAL | Status: DC | PRN

## 2024-08-14 MED ORDER — ALPRAZOLAM 0.5 MG PO TABS
1.0000 mg | ORAL_TABLET | Freq: Three times a day (TID) | ORAL | Status: DC | PRN
  Administered 2024-08-15: 1 mg via ORAL
  Filled 2024-08-14: qty 2

## 2024-08-14 MED ORDER — NITROGLYCERIN 0.4 MG SL SUBL: 0.4000 mg | SUBLINGUAL_TABLET | SUBLINGUAL | Status: DC | PRN

## 2024-08-14 MED ORDER — ONDANSETRON HCL 4 MG/2ML IJ SOLN: 4.0000 mg | Freq: Four times a day (QID) | INTRAMUSCULAR | Status: DC | PRN

## 2024-08-14 MED ORDER — LITHIUM CARBONATE 150 MG PO CAPS
150.0000 mg | ORAL_CAPSULE | Freq: Two times a day (BID) | ORAL | Status: DC
  Administered 2024-08-14 – 2024-08-15 (×3): 150 mg via ORAL
  Filled 2024-08-14 (×4): qty 1

## 2024-08-14 MED ORDER — PERFLUTREN LIPID MICROSPHERE
1.0000 mL | INTRAVENOUS | Status: AC | PRN
  Administered 2024-08-14: 2 mL via INTRAVENOUS

## 2024-08-14 MED ORDER — DAPAGLIFLOZIN PROPANEDIOL 10 MG PO TABS
10.0000 mg | ORAL_TABLET | Freq: Every day | ORAL | Status: DC
  Administered 2024-08-14 – 2024-08-15 (×2): 10 mg via ORAL
  Filled 2024-08-14 (×2): qty 1

## 2024-08-14 MED ORDER — ISOSORBIDE MONONITRATE ER 60 MG PO TB24
120.0000 mg | ORAL_TABLET | Freq: Every day | ORAL | Status: DC
  Administered 2024-08-14 – 2024-08-15 (×2): 120 mg via ORAL
  Filled 2024-08-14 (×2): qty 2

## 2024-08-14 MED ORDER — PANTOPRAZOLE SODIUM 40 MG PO TBEC
40.0000 mg | DELAYED_RELEASE_TABLET | Freq: Two times a day (BID) | ORAL | Status: DC
Start: 2024-08-14 — End: 2024-08-15
  Administered 2024-08-14 – 2024-08-15 (×3): 40 mg via ORAL
  Filled 2024-08-14 (×3): qty 1

## 2024-08-14 MED ORDER — FAMOTIDINE 20 MG PO TABS
20.0000 mg | ORAL_TABLET | Freq: Two times a day (BID) | ORAL | Status: DC
  Administered 2024-08-14 – 2024-08-15 (×3): 20 mg via ORAL
  Filled 2024-08-14 (×3): qty 1

## 2024-08-14 MED ORDER — METOPROLOL SUCCINATE ER 25 MG PO TB24
25.0000 mg | ORAL_TABLET | Freq: Every day | ORAL | Status: DC
  Administered 2024-08-14 – 2024-08-15 (×2): 25 mg via ORAL
  Filled 2024-08-14 (×2): qty 1

## 2024-08-14 NOTE — Progress Notes (Signed)
 Progress Note   Patient: Lawrence [name] DOB: 04/28/1956 DOA: 08/13/2024     0 DOS: the patient was seen and examined on 08/14/2024   Brief hospital course: Lawrence [name] is a 68 y.o. male with medical history significant of hypertension, hyperlipidemia, CAD (s/p stent x 4 and CABG  2016), chronic combined systolic and diastolic CHF,  A- fib on Eliquis , PSVT, type 2 diabetes mellitus, COPD on 2L O2, OSA not using CPAP, dCHF, anxiety, celiac disease, pancreatitis, who presents to the emergency department due to chest pain which started last night.  Chest pain was sharp, midsternal, rated as 8/10 on pain scale and was associated with tiredness and fatigue.  Chest pain started to radiate to left shoulder around 3 AM and this continued to worsen as the day progressed.  Patient also complaining of low BP with SBP in the 80s at home, he thinks that he was possibly dehydrated since he has been compliant with his Lasix  and has had some fluid taken off over the last week or 2.  Patient has extensive cardiac history, so, he activated EMS, on arrival of EMS team, aspirin  325 mg x 1 was given and his BP was noted in normal range.  He was taken to the ED for further evaluation and management.   Patient also complained of right upper abdominal pain which has been ongoing for about 2 weeks and he followed up with his PCP who had plan to have an outpatient ultrasound done.   ED Course:  In the emergency department, he was hemodynamically stable.  Workup in the ED showed normal CBC except for elevated hemoglobin at 17.4, hematocrit 52.1, BMP was normal except for blood glucose of 113, troponin x 2 was normal, lipase 28, BNP 82.8, urinalysis was positive for glycosuria. CT abdomen and pelvis with contrast showed no CT evidence for acute intra-abdominal or pelvic abnormality.  Mild diverticular disease of the left colon without acute inflammatory process. Chest x-ray showed evidence of prior median  sternotomy/CABG.  Mild left basilar linear atelectasis. Breathing treatment was provided, fentanyl  was given IV hydration  250 mL was provided. Cardiologist (Dr. Wilburn) was consulted and recommended observing patient overnight for chest pain, do an echocardiogram in the morning (in leu of pending outpatient echocardiogram on Tuesday -9/30). [facility] was asked to admit patient.    Assessment and Plan:   Chest pain Continue telemetry  Troponins x 2 -16 > 15 EKG personally reviewed showed atrial fibrillation with rate controlled Echocardiogram still pending Patient has been seen by cardiologist and case discussed Diet resumed Continue Imdur , Farxiga , Entresto , Eliquis  and statin therapy as recommended by cardiologist    Right upper quadrant abdominal pain CT abdomen and pelvis with contrast showed no intra-abdominal or pelvic abnormality Continue oxycodone  as needed     CAD (coronary artery disease)  Troponin x 2 was normal Continue Eliquis , Imdur , Lipitor  Continue management as above   Chronic combined systolic (congestive) and diastolic (congestive) heart failure Echocardiogram done on 06/10/2024 showed LVEF of 45 to 50%.  LV with global hypokinesis.  Mild concentric LVH.  G1 DD. BNP 82.8 Patient does not appear to be fluid overloaded Continue total input/output, daily weights and fluid restriction Continue home meds Lasix  and spironolactone  is held due to dehydration Echocardiogram is pending   Mixed hyperlipidemia  Continue Lipitor    COPD (chronic obstructive pulmonary disease)  Continue home meds   Essential hypertension Continue Imdur , Entresto    Atrial fibrillation, chronic  EKG personally reviewed showed  A-fib with rate controlled Continue Eliquis  Consider restarting patient's Coreg  after her cardiologist evaluation for chest pain   Anxiety and depression Continue home medications   Bipolar disorder Continue lithium    Type 2 diabetes mellitus Hemoglobin A1c  on 08/11/24 was 6.8 Continue Farxiga  Continue ISS and hypoglycemia protocol   GERD Continue Pepcid , Protonix        DVT prophylaxis: Eliquis    Code Status: Full code   Family Communication: None at bedside   Consults: Cardiology (by EDP)   Subjective:  Patient admits to improvement in chest pain but still has some pains Echo pending Denies nausea vomiting or abdominal pain  Physical Exam: General: 68 y.o. year-old male well developed well nourished in no acute distress.  Alert and oriented x3. HEENT: NCAT, EOMI, dry mucous membranes Neck: Supple, trachea medial Cardiovascular: Regular rate and rhythm with no rubs or gallops.  No thyromegaly or JVD noted.  No lower extremity edema. 2/4 pulses in all 4 extremities. Respiratory: Clear to auscultation with no wheezes or rales. Good inspiratory effort. Abdomen: Soft, mild tenderness to palpation of RUQ and right CVA without guarding.  Nondistended with normal bowel sounds x4 quadrants. Muskuloskeletal: No cyanosis, clubbing or edema noted bilaterally Neuro: CN II-XII intact, strength 5/5 x 4, sensation, reflexes intact Skin: No ulcerative lesions noted or rashes Psychiatry: Judgement and insight appear normal. Mood is appropriate for condition and setting  Vitals:   08/14/24 1300 08/14/24 1500 08/14/24 1548 08/14/24 1654  BP: 109/79 104/74  (!) 141/91  Pulse: 82 76  80  Resp: 17 (!) 9  18  Temp:   98 F (36.7 C) (!) 97.5 F (36.4 C)  TempSrc:   Oral   SpO2: 98% 97%  93%  Weight:      Height:        Data Reviewed: EKG reviewed as well as below mentioned labs    Latest Ref Rng & Units 08/14/2024    6:15 AM 08/13/2024    4:17 PM 08/11/2024    2:04 PM  BMP  Glucose 70 - 99 mg/dL 821  886  894   BUN 8 - 23 mg/dL 17  16  17    Creatinine 0.61 - 1.24 mg/dL 8.91  8.99  8.85   BUN/Creat Ratio 10 - 24   15   Sodium 135 - 145 mmol/L 138  139  143   Potassium 3.5 - 5.1 mmol/L 4.3  4.4  4.8   Chloride 98 - 111 mmol/L 101  100   103   CO2 22 - 32 mmol/L 27  27  24    Calcium  8.9 - 10.3 mg/dL 9.3  9.6  89.7      Author: Drue ONEIDA Potter, MD 08/14/2024 6:00 PM  For on call review [email or web address].

## 2024-08-14 NOTE — ED Notes (Signed)
Patient medicated for chest pain

## 2024-08-14 NOTE — Consult Note (Signed)
 [facility] CLINIC CARDIOLOGY CONSULT NOTE       Patient ID: Lawrence [name] MRN: [id number] DOB/AGE: 11-17-1955 68 y.o.  Admit date: 08/13/2024 Referring Physician Dr. Oneil Budge Primary Physician Gasper, Nancyann BRAVO, MD  Primary Cardiologist Dr. Hilarie Reason for Consultation chest pain  HPI: Lawrence [name] is a 68 y.o. male  with a past medical history of CAD (h/o STEMI s/p CABG x2 LIMA-LAD and SVG-OM1 2002 c/b coronary artery dissection with multiple subsequent PCI and stents), chronic stable angina, HFpEF (50-55%, mild LVH 10/2022) paroxysmal atrial fibrillation s/p unsuccessful DCCV 06/29/2022, COPD (PRN 2L), hx tobacco use, DM2 who presented to the ED on 08/13/2024 for chest pain. Cardiology was consulted for further evaluation.   Patient states that prior to coming to the hospital he was having some issues with chest pain and decided to come in to be evaluated.  Workup in the ED notable for creatinine 1.00, potassium 4.4, hemoglobin 17.4, WBC 9.7. Troponins normal x 2 at 16, 15, BNP normal at 82. EKG in the ED atrial fibrillation rate 82 bpm.  Chest x-ray without acute abnormality.   At the time my evaluation this morning patient is resting in hospital bed.  He states that overall he is feeling better today than he did yesterday.  States that his pain improved this morning after he received oxycodone .  He reports that his chest pain at home came on when he was just resting.  States that this usually does get worse with activity.  Also endorses cough and shortness of breath which has been slightly worse recently than baseline.  Also endorses occasional episodes of palpitations but denies any recent issues with lower extremity edema.  Has been off of his Lasix  as this recently caused him to become dehydrated but has not had any issues recently with volume overload.  Review of systems complete and found to be negative unless listed above    Past Medical History:  Diagnosis Date   A-fib (HCC)     Anemia    Anginal pain    Anxiety    Arthritis    Asthma    Atrial fibrillation, chronic (HCC) 10/19/2023   CAD (coronary artery disease)    a. 2002 CABGx2 (LIMA->LAD, VG->VG->OM1);  b. 09/2012 DES->OM;  c. 03/2015 PTCA of LAD [facility]) in setting of atretic LIMA; d. 05/2015 Cath [facility]): nonobs dzs; e. 06/2015 Cath ([facility]): LM nl, LAD 45p/d ISR, 50d, D1/2 small, LCX 50p/d ISR, OM1 70ost, 30 ISR, VG->OM1 50ost, 98m, LIMA->LAD 99p/d - atretic, RCA dom, nl; f.cath 10/16: 40-50%(FFR 0.90) pLAD, 75% (FFR 0.77) mLAD s/p PCI/DES, oRCA 40% (FFR0.95)   Cancer (HCC)    SKIN CANCER ON BACK   Celiac disease    Chronic diastolic CHF (congestive heart failure) (HCC)    a. 06/2009 Echo: EF 60-65%, Gr 1 DD, triv AI, mildly dil LA, nl RV.   COPD (chronic obstructive pulmonary disease) (HCC)    a. Chronic bronchitis and emphysema.   DDD (degenerative disc disease), lumbar    Diverticulosis    Dysrhythmia    Essential hypertension    GERD (gastroesophageal reflux disease)    History of hiatal hernia    History of kidney stones    H/O   History of tobacco abuse    a. Quit 2014.   Myocardial infarction [facility]) 2002   4 STENTS   Pancreatitis    PSVT (paroxysmal supraventricular tachycardia)    a. 10/2012 Noted on Zio Patch.   RLL pneumonia 05/30/2024  Sleep apnea    LOST CORD TO CPAP -ONLY 02 @ BEDTIME   Tubular adenoma of colon    Type II diabetes mellitus (HCC)     Past Surgical History:  Procedure Laterality Date   BYPASS GRAFT     CARDIAC CATHETERIZATION N/A 07/12/2015   rocedure: Left Heart Cath and Cors/Grafts Angiography;  Surgeon: Victory LELON Sharps, MD;  Location: [facility] INVASIVE CV LAB;  Service: Cardiovascular;  Laterality: N/A;   CARDIAC CATHETERIZATION Right 10/07/2015   Procedure: Left Heart Cath and Cors/Grafts Angiography;  Surgeon: Denyse DELENA Bathe, MD;  Location: [facility] INVASIVE CV LAB;  Service: Cardiovascular;  Laterality: Right;   CARDIAC CATHETERIZATION N/A 04/06/2016   Procedure: Left Heart  Cath and Coronary Angiography;  Surgeon: Cara JONETTA Lovelace, MD;  Location: [facility] INVASIVE CV LAB;  Service: Cardiovascular;  Laterality: N/A;   CARDIAC CATHETERIZATION  04/06/2016   Procedure: Bypass Graft Angiography;  Surgeon: Cara JONETTA Lovelace, MD;  Location: [facility] INVASIVE CV LAB;  Service: Cardiovascular;;   CARDIAC CATHETERIZATION N/A 11/02/2016   Procedure: Left Heart Cath and Cors/Grafts Angiography and possible PCI;  Surgeon: Cara JONETTA Lovelace, MD;  Location: [facility] INVASIVE CV LAB;  Service: Cardiovascular;  Laterality: N/A;   CARDIAC CATHETERIZATION N/A 11/02/2016   Procedure: Coronary Stent Intervention;  Surgeon: Cara JONETTA Lovelace, MD;  Location: [facility] INVASIVE CV LAB;  Service: Cardiovascular;  Laterality: N/A;   CARDIOVERSION N/A 06/29/2022   Procedure: CARDIOVERSION;  Surgeon: Hester Wolm PARAS, MD;  Location: [facility] ORS;  Service: Cardiovascular;  Laterality: N/A;   CHOLECYSTECTOMY     CIRCUMCISION N/A 06/09/2019   Procedure: CIRCUMCISION ADULT;  Surgeon: Francisca Redell BROCKS, MD;  Location: [facility] ORS;  Service: Urology;  Laterality: N/A;   COLONOSCOPY WITH PROPOFOL  N/A 04/01/2018   Procedure: COLONOSCOPY WITH PROPOFOL ;  Surgeon: Viktoria Lamar DASEN, MD;  Location: [facility] ENDOSCOPY;  Service: Endoscopy;  Laterality: N/A;   COLONOSCOPY WITH PROPOFOL  N/A 05/01/2023   Procedure: COLONOSCOPY WITH PROPOFOL ;  Surgeon: Unk Corinn Skiff, MD;  Location: [facility] ENDOSCOPY;  Service: Gastroenterology;  Laterality: N/A;   ESOPHAGEAL DILATION     ESOPHAGOGASTRODUODENOSCOPY (EGD) WITH PROPOFOL  N/A 04/01/2018   Procedure: ESOPHAGOGASTRODUODENOSCOPY (EGD) WITH PROPOFOL ;  Surgeon: Viktoria Lamar DASEN, MD;  Location: [facility] ENDOSCOPY;  Service: Endoscopy;  Laterality: N/A;   ESOPHAGOGASTRODUODENOSCOPY (EGD) WITH PROPOFOL  N/A 05/01/2023   Procedure: ESOPHAGOGASTRODUODENOSCOPY (EGD) WITH PROPOFOL ;  Surgeon: Unk Corinn Skiff, MD;  Location: [facility] ENDOSCOPY;  Service: Gastroenterology;  Laterality: N/A;   GIVENS CAPSULE  STUDY  05/01/2023   Procedure: GIVENS CAPSULE STUDY;  Surgeon: Unk Corinn Skiff, MD;  Location: [facility] ENDOSCOPY;  Service: Gastroenterology;;   LEFT HEART CATH AND CORS/GRAFTS ANGIOGRAPHY N/A 06/12/2019   Procedure: LEFT HEART CATH AND CORS/GRAFTS ANGIOGRAPHY;  Surgeon: Bosie Vinie DELENA, MD;  Location: [facility] INVASIVE CV LAB;  Service: Cardiovascular;  Laterality: N/A;   LEFT HEART CATH AND CORS/GRAFTS ANGIOGRAPHY N/A 03/11/2020   Procedure: LEFT HEART CATH AND CORS/GRAFTS ANGIOGRAPHY;  Surgeon: Ammon Blunt, MD;  Location: [facility] INVASIVE CV LAB;  Service: Cardiovascular;  Laterality: N/A;   LEFT HEART CATH AND CORS/GRAFTS ANGIOGRAPHY N/A 05/01/2021   Procedure: LEFT HEART CATH AND CORS/GRAFTS ANGIOGRAPHY;  Surgeon: Hester Wolm PARAS, MD;  Location: [facility] INVASIVE CV LAB;  Service: Cardiovascular;  Laterality: N/A;   LEFT HEART CATH AND CORS/GRAFTS ANGIOGRAPHY N/A 11/02/2022   Procedure: LEFT HEART CATH AND CORS/GRAFTS ANGIOGRAPHY;  Surgeon: Lovelace Cara JONETTA, MD;  Location: [facility] INVASIVE CV LAB;  Service: Cardiovascular;  Laterality: N/A;   TONSILLECTOMY  VASCULAR SURGERY      (Not in a hospital admission)  Social History   Socioeconomic History   Marital status: Widowed    Spouse name: Not on file   Number of children: 1   Years of education: Not on file   Highest education level: 10th grade  Occupational History   Occupation: Disabled   Occupation: on Tree surgeon  Tobacco Use   Smoking status: Former    Current packs/day: 0.00    Average packs/day: 3.0 packs/day for 50.0 years (150.0 ttl pk-yrs)    Types: Cigarettes    Start date: 04/23/1963    Quit date: 04/22/2013    Years since quitting: 11.3   Smokeless tobacco: Never   Tobacco comments:    Reports not smoking for approx 8 years.  Vaping Use   Vaping status: Never Used  Substance and Sexual Activity   Alcohol use: No    Comment: Hx Alcohol Abuse   Drug use: Not Currently    Types: Marijuana   Sexual activity:  Not Currently  Other Topics Concern   Not on file  Social History Narrative   Pt lives in [place] with wife.  Does not routinely exercise.   Social Drivers of Corporate investment banker Strain: Low Risk  (06/19/2024)   Received from [facility]   Overall Financial Resource Strain (CARDIA)    How hard is it for you to pay for the very basics like food, housing, medical care, and heating?: Not very hard  Food Insecurity: Food Insecurity Present (06/16/2024)   Hunger Vital Sign    Worried About Running Out of Food in the Last Year: Sometimes true    Ran Out of Food in the Last Year: Never true  Transportation Needs: No Transportation Needs (06/16/2024)   PRAPARE - Administrator, Civil Service (Medical): No    Lack of Transportation (Non-Medical): No  Physical Activity: Insufficiently Active (03/14/2024)   Exercise Vital Sign    Days of Exercise per Week: 3 days    Minutes of Exercise per Session: 20 min  Stress: No Stress Concern Present (03/14/2024)   [employer] of Occupational Health - Occupational Stress Questionnaire    Feeling of Stress : Only a little  Recent Concern: Stress - Stress Concern Present (01/13/2024)   [employer] of Occupational Health - Occupational Stress Questionnaire    Feeling of Stress : To some extent  Social Connections: Socially Isolated (06/10/2024)   Social Connection and Isolation Panel    Frequency of Communication with Friends and Family: More than three times a week    Frequency of Social Gatherings with Friends and Family: Once a week    Attends Religious Services: Never    Database administrator or Organizations: No    Attends Banker Meetings: Never    Marital Status: Widowed  Intimate Partner Violence: Not At Risk (06/16/2024)   Humiliation, Afraid, Rape, and Kick questionnaire    Fear of Current or Ex-Partner: No    Emotionally Abused: No    Physically Abused: No    Sexually Abused: No    Family History   Problem Relation Age of Onset   Heart attack Mother    Depression Mother    Heart disease Mother    COPD Mother    Hypertension Mother    Heart attack Father    Diabetes Father    Depression Father    Heart disease Father    Cirrhosis Father  Parkinson's disease Brother      Vitals:   08/14/24 0801 08/14/24 1000 08/14/24 1100 08/14/24 1201  BP: (!) 146/97 115/84 (!) 124/97   Pulse: 86 87 86   Resp: 18 13 17    Temp: 97.7 F (36.5 C)   97.7 F (36.5 C)  TempSrc: Oral   Oral  SpO2: 100% 94% 91%   Weight:      Height:        PHYSICAL EXAM General: Chronically ill-appearing male, well nourished, in no acute distress. HEENT: Normocephalic and atraumatic. Neck: No JVD.  Lungs: Normal respiratory effort on room air. Clear bilaterally to auscultation. No wheezes, crackles, rhonchi.  Heart: Irregularly irregular, controlled rate. Normal S1 and S2 without gallops or murmurs.  Abdomen: Non-distended appearing.  Msk: Normal strength and tone for age. Extremities: Warm and well perfused. No clubbing, cyanosis.  No edema.  Neuro: Alert and oriented X 3. Psych: Answers questions appropriately.   Labs: Basic Metabolic Panel: Recent Labs    08/13/24 1617 08/14/24 0615  NA 139 138  K 4.4 4.3  CL 100 101  CO2 27 27  GLUCOSE 113* 178*  BUN 16 17  CREATININE 1.00 1.08  CALCIUM  9.6 9.3  MG  --  2.1  PHOS  --  3.6   Liver Function Tests: Recent Labs    08/13/24 1617 08/14/24 0615  AST 24 26  ALT 22 24  ALKPHOS 54 50  BILITOT 1.8* 1.5*  PROT 6.6 6.1*  ALBUMIN 3.8 3.4*   Recent Labs    08/13/24 1617  LIPASE 28   CBC: Recent Labs    08/13/24 1617 08/14/24 0615  WBC 9.7 6.8  HGB 17.4* 16.5  HCT 52.1* 50.1  MCV 91.1 91.1  PLT 182 155   Cardiac Enzymes: Recent Labs    08/13/24 1617 08/13/24 1835  TROPONINIHS 16 15   BNP: Recent Labs    08/13/24 1617  BNP 82.8   D-Dimer: No results for input(s): DDIMER in the last 72 hours. Hemoglobin  A1C: Recent Labs    08/11/24 1404  HGBA1C 6.8*   Fasting Lipid Panel: No results for input(s): CHOL, HDL, LDLCALC, TRIG, CHOLHDL, LDLDIRECT in the last 72 hours. Thyroid  Function Tests: No results for input(s): TSH, T4TOTAL, T3FREE, THYROIDAB in the last 72 hours.  Invalid input(s): FREET3 Anemia Panel: No results for input(s): VITAMINB12, FOLATE, FERRITIN, TIBC, IRON , RETICCTPCT in the last 72 hours.   Radiology: CT ABDOMEN PELVIS W CONTRAST Result Date: 08/13/2024 CLINICAL DATA:  Right-sided flank pain right upper quadrant pain EXAM: CT ABDOMEN AND PELVIS WITH CONTRAST TECHNIQUE: Multidetector CT imaging of the abdomen and pelvis was performed using the standard protocol following bolus administration of intravenous contrast. RADIATION DOSE REDUCTION: This exam was performed according to the departmental dose-optimization program which includes automated exposure control, adjustment of the mA and/or kV according to patient size and/or use of iterative reconstruction technique. CONTRAST:  OMNIPAQUE  IOHEXOL  300 MG/ML  SOLN COMPARISON:  CT 05/29/2024, 03/11/2024 FINDINGS: Lower chest: Lung bases demonstrate no acute airspace disease. Hepatobiliary: Cholecystectomy. No biliary dilatation. No focal hepatic abnormality. Pancreas: Unremarkable. No pancreatic ductal dilatation or surrounding inflammatory changes. Spleen: Normal in size without focal abnormality. Adrenals/Urinary Tract: Stable small adrenal gland nodules measuring to 14 mm on the left, no imaging follow-up recommended. Kidneys show no hydronephrosis. The bladder is unremarkable Stomach/Bowel: The stomach is within normal limits. There is no dilated small bowel. No acute bowel wall thickening. Negative appendix. Mild diverticular disease of the left  colon Vascular/Lymphatic: Moderate aortic atherosclerosis. No aneurysm. No suspicious lymph nodes. Reproductive: Mildly prominent heterogeneous prostate  Other: Negative for pelvic effusion or free air Musculoskeletal: No acute or suspicious osseous abnormality. IMPRESSION: 1. No CT evidence for acute intra-abdominal or pelvic abnormality. 2. Mild diverticular disease of the left colon without acute inflammatory process. 3. Aortic atherosclerosis. Aortic Atherosclerosis (ICD10-I70.0). Electronically Signed   By: Luke Bun M.D.   On: 08/13/2024 18:11   DG Chest Portable 1 View Result Date: 08/13/2024 CLINICAL DATA:  Chest pain and confusion. EXAM: PORTABLE CHEST 1 VIEW COMPARISON:  August 01, 2024 FINDINGS: Multiple sternal wires and vascular clips are seen. The heart size and mediastinal contours are within normal limits. A coronary artery stent is noted. Mild linear atelectasis is seen within the left lung base. No pleural effusion or pneumothorax is identified. Multilevel degenerative changes seen throughout the thoracic spine. IMPRESSION: 1. Evidence of prior median sternotomy/CABG. 2. Mild left basilar linear atelectasis. Electronically Signed   By: Suzen Dials M.D.   On: 08/13/2024 16:59   DG Chest 2 View Result Date: 08/01/2024 CLINICAL DATA:  Chest pain EXAM: CHEST - 2 VIEW COMPARISON:  07/17/2024 FINDINGS: Cardiac shadow is within normal limits. Aortic calcifications are again seen. Postsurgical changes are noted. Lungs are well aerated bilaterally. No focal infiltrate or effusion is seen. No acute bony abnormality is noted. IMPRESSION: No acute abnormality noted. Electronically Signed   By: Oneil Devonshire M.D.   On: 08/01/2024 19:45   DG Chest Port 1 View Result Date: 07/17/2024 CLINICAL DATA:  Chest pain EXAM: PORTABLE CHEST 1 VIEW COMPARISON:  None Available. FINDINGS: Lungs are well expanded, symmetric, and clear. No pneumothorax or pleural effusion. Coronary artery bypass grafting has been performed. Cardiac size is within normal limits. Pulmonary vascularity is normal. Osseous structures are age-appropriate. No acute bone abnormality.  IMPRESSION: No active disease. Electronically Signed   By: Dorethia Molt M.D.   On: 07/17/2024 20:13    ECHO pending  TELEMETRY reviewed by me 08/14/2024: Atrial fibrillation rate 80s  EKG reviewed by me: Atrial fibrillation rate 82 bpm  Data reviewed by me 08/14/2024: last 24h vitals tele labs imaging I/O ED provider note, admission H&P  Active Problems:   Mixed hyperlipidemia   Depression with anxiety   Chronic combined systolic (congestive) and diastolic (congestive) heart failure (HCC)   GERD without esophagitis   Type 2 diabetes mellitus without complications (HCC)   Essential hypertension   Atrial fibrillation, chronic (HCC)   Coronary artery disease   Chest pain   COPD (chronic obstructive pulmonary disease) (HCC)   Bipolar disorder (HCC)   Right upper quadrant abdominal pain   Elevated hemoglobin    ASSESSMENT AND PLAN:  Lawrence [name] is a 68 y.o. male  with a past medical history of CAD (h/o STEMI s/p CABG x2 LIMA-LAD and SVG-OM1 2002 c/b coronary artery dissection with multiple subsequent PCI and stents), chronic stable angina, HFpEF (50-55%, mild LVH 10/2022) paroxysmal atrial fibrillation s/p unsuccessful DCCV 06/29/2022, COPD (PRN 2L), hx tobacco use, DM2 who presented to the ED on 08/13/2024 for chest pain. Cardiology was consulted for further evaluation.   # Chest pain # CAD s/p CABG and multiple stents with chronic angina # Chronic HFrEF # Ischemic cardiomyopathy # Persistent atrial fibrillation Patient with a history of chronic angina presented with complaints of chest discomfort, improved this AM after dose of oxycodone . Troponins normal x2. EKG without acute ischemic changes.  - No plan for further cardiac invasive  evaluation at this time given patient's known coronary disease not amenable to intervention and normal troponins. - Echo pending.  - Reportedly was not taking Ranexa  at home and was only taking reduced dose of Imdur .  Resume home metoprolol  succinate  25 mg daily and Ranexa  500 mg twice daily.  Continue home Imdur  120 mg daily.  Likely will not tolerate higher dose of metoprolol  due to history of bradycardia. - Continue Farxiga  10 mg daily, Entresto  49-51 mg twice daily.  Will likely plan to resume home spironolactone  tomorrow. - Continue Eliquis  5 mg twice daily for stroke risk reduction. - Continue atorvastatin  80 mg daily.   This patient's plan of care was discussed and created with Dr. Florencio and he is in agreement.  Signed: Danita Bloch, PA-C  08/14/2024, 12:29 PM [facility]

## 2024-08-14 NOTE — Progress Notes (Signed)
 Spoke with pt an made aware of results. 1 month appt has been scheduled.

## 2024-08-14 NOTE — ED Notes (Signed)
 Patient requesting pain medication due to chest pain.  Offered nitroglycerin  and Tylenol  and patient reported that the offered medications do not help his pain.

## 2024-08-14 NOTE — Plan of Care (Signed)
  Problem: Skin Integrity: Goal: Risk for impaired skin integrity will decrease Outcome: Progressing   Problem: Activity: Goal: Risk for activity intolerance will decrease Outcome: Progressing   Problem: Pain Managment: Goal: General experience of comfort will improve and/or be controlled Outcome: Progressing

## 2024-08-15 ENCOUNTER — Encounter: Payer: Self-pay | Admitting: Oncology

## 2024-08-15 DIAGNOSIS — R0789 Other chest pain: Secondary | ICD-10-CM | POA: Diagnosis not present

## 2024-08-15 DIAGNOSIS — I25119 Atherosclerotic heart disease of native coronary artery with unspecified angina pectoris: Secondary | ICD-10-CM | POA: Diagnosis not present

## 2024-08-15 LAB — BASIC METABOLIC PANEL WITH GFR
Anion gap: 8 (ref 5–15)
BUN: 18 mg/dL (ref 8–23)
CO2: 31 mmol/L (ref 22–32)
Calcium: 9.3 mg/dL (ref 8.9–10.3)
Chloride: 100 mmol/L (ref 98–111)
Creatinine, Ser: 0.99 mg/dL (ref 0.61–1.24)
GFR, Estimated: >60 mL/min (ref >60)
Glucose, Bld: 103 mg/dL — ABNORMAL HIGH (ref 70–99)
Potassium: 4 mmol/L (ref 3.5–5.1)
Sodium: 139 mmol/L (ref 135–145)

## 2024-08-15 LAB — ECHOCARDIOGRAM COMPLETE
AR max vel: 2.96 cm2
AV Area VTI: 2.87 cm2
AV Area mean vel: 3.36 cm2
AV Mean grad: 1 mmHg
AV Peak grad: 2.8 mmHg
Ao pk vel: 0.84 m/s
Area-P 1/2: 8.34 cm2
Calc EF: 41.3 %
Height: 67 in
MV VTI: 2.62 cm2
S' Lateral: 2.7 cm
Single Plane A2C EF: 39.5 %
Single Plane A4C EF: 42.9 %
Weight: 2928 [oz_av]

## 2024-08-15 LAB — GLUCOSE, CAPILLARY
Glucose-Capillary: 117 mg/dL — ABNORMAL HIGH (ref 70–99)
Glucose-Capillary: 151 mg/dL — ABNORMAL HIGH (ref 70–99)
Glucose-Capillary: 97 mg/dL (ref 70–99)

## 2024-08-15 LAB — CBC
HCT: 51.7 % (ref 39.0–52.0)
Hemoglobin: 16.8 g/dL (ref 13.0–17.0)
MCH: 30.3 pg (ref 26.0–34.0)
MCHC: 32.5 g/dL (ref 30.0–36.0)
MCV: 93.3 fL (ref 80.0–100.0)
Platelets: 161 K/uL (ref 150–400)
RBC: 5.54 MIL/uL (ref 4.22–5.81)
RDW: 14 % (ref 11.5–15.5)
WBC: 7.5 K/uL (ref 4.0–10.5)
nRBC: 0 % (ref 0.0–0.2)

## 2024-08-15 MED ORDER — RANOLAZINE ER 1000 MG PO TB12: 1000.0000 mg | ORAL_TABLET | Freq: Two times a day (BID) | ORAL | 0 refills | Status: DC

## 2024-08-15 MED ORDER — RANOLAZINE ER 500 MG PO TB12: 1000.0000 mg | ORAL_TABLET | Freq: Two times a day (BID) | ORAL | Status: DC

## 2024-08-15 MED ORDER — ACETAMINOPHEN 325 MG PO TABS: 650.0000 mg | ORAL_TABLET | Freq: Four times a day (QID) | ORAL | 0 refills | Status: AC | PRN

## 2024-08-15 MED ORDER — METOPROLOL SUCCINATE ER 25 MG PO TB24: 12.5000 mg | ORAL_TABLET | Freq: Every day | ORAL | Status: DC

## 2024-08-15 MED ORDER — SPIRONOLACTONE 25 MG PO TABS
25.0000 mg | ORAL_TABLET | Freq: Every day | ORAL | Status: DC
  Administered 2024-08-15: 25 mg via ORAL
  Filled 2024-08-15: qty 1

## 2024-08-15 NOTE — Discharge Instructions (Signed)
 Food Resources  Agency Name: [facility] Agency Address: [address], [place], [place] [address] Phone: [phone] Website: [email or web address] Service(s) Offered: Housing services, self-sufficiency, congregate meal program, weatherization program, Event organiser program, emergency food assistance,  housing counseling, home ownership program, wheels - to work program.  [employer] free for 60 and older at various locations from [employer], Monday-Friday:  [employer], [address] [place], [phone] -[facility], [address], [place] [phone]  -[facility], [address], [place] [phone]  -[address] [place], [phone]  Agency Name: [facility] Address: [id number], [place], [place] [address] Phone: [phone] Website: [email or web address] Service(s) Offered: Home delivered hot, frozen, and emergency  meals. Grocery assistance program which matches  volunteers one-on-one with seniors unable to grocery shop  for themselves. Must be 60 years and older; less than 20  hours of in-home aide service, limited or no driving ability;  live alone or with someone with a disability; live in  [place].  Agency Name: [facility] [employer]) Address: [address] [place], [place] [address] Phone: [phone] Service(s) Offered: Food is served to shut-ins, homeless, elderly, and low income people in the community every Saturday (11:30 am-12:30 pm) and Sunday (12:30 pm-1:30pm). Volunteers also offer help and encouragement in seeking employment,  and spiritual guidance.  Agency Name: [employer] Address: [address] [place], [place] [address] Phone: [phone] Service(s) Offered: Child support services; child welfare services; food stamps; Medicaid; work first family assistance; and aid  with fuel,  rent, food and medicine.  Agency Name: [employer] Address: [address], [place], [place] Phone: [phone] Website: [email or web address] Services Offered: Monday 10:00am-12:00, 8:00pm-9:00pm, and Friday 10:00am-12:00.  Agency Name: [employer] [facility] Address: [address] [place], [place] [address] Phone: [phone] Website: [email or web address] Service(s) Offered: Serves weekday meals, open from 11:30 am- 1:00 pm., and 6:30-7:30pm, Monday-Wednesday-Friday distributes food 3:30-6pm, Monday-Wednesday-Friday.  Agency Name: [facility] Address: [address], [place], [place] Phone: [phone] Website: [email or web address] Services Offered: Distributes food the 4th Saturday of the month, starting at 8:00 am  Agency Name: [facility] Address: [phone], [place], [place] [address] Phone: [phone] Website: [email or web address] Service(s) Offered: [employer] life, weekly food pantry. Open Wednesdays from 10:00am-noon.  Agency Name: [facility] [employer] Address: [address] [place], [place], [place] Phone: [phone] Services Offered: Distributes food 9am-1pm, Monday-Thursday. Call for details.  Agency Name: [facility] Address: [address], [place], [place] [address] Phone: [phone] Website: [email or web address] Service(s) Offered: Games developer. Call for assistance.  Agency Name: [facility] Address: [address] [place], [place] [address] Phone: [phone] Service Offered: Emergency Food Pantry. Call for appointment.  Agency Name: [facility] Address: [address], [place], [place] [address] Phone: [phone] Website: [email or web address] Services Offered: Games developer. Call for details  Agency Name: [facility] Address: [address] [place], [place] Phone:  [phone] Website: [email or web address] Service(s) Offered: Emergency Food Pantry. Call for details.  Agency Name: Holiday representative Address: [address] [place], [place] [address] Phone: [phone] or [phone] Website: [email or web address] Service(s) Offered: Distribute food 9am-11:30 am, Tuesday-Friday, and 1-3:30pm, Monday-Friday. Food pantry Monday-Friday 1pm-3pm, fresh items, Mon.-Wed.-Fri.  Agency Name: [facility] Address: [address] [place], [place] [address] Phone: [phone] Website: [email or web address] Services Offered: Distribute food Tues and Sats from 9:00am-noon.  Closed 1st Saturday of each month. Call for details  Agency Name: [employer] Address: Fayrene Boatman [facility] [address] [place] [address] Phone: [phone]  Services Offered: Delivers meals every Thursday   Do you feel isolated?  [facility] offers a [employer] that anyone can call toll free at [phone]. The friendship line is available 24 hours a day  [employer] is a Program of All-inclusive Care for the Elderly (PACE). Their mission is to promote and sustain the independence of seniors wishing to remain in the community. They provide seniors with comprehensive Boehner-term health, social, medical and dietary care. Their program is a safe alternative to nursing home care. [phone]  [facility] Address [facility] [address] [place], [place] [address] Phone: [phone]. . Online zoom yoga class, connect with others without leaving your home [facility] offers [place] dance cardio sessions for individuals via Zoom. This program provides: - Dance fitness activities Please contact program for more information. Servinganyone in need adults 18+ hiv/aids individuals families Call [phone]  Email [email or web address] to get more info  [employer]  offers an online [employer] to individuals where they can receive help to focus on their best health. Whether you're a [employer] member or not, the neighborhood center offers a... Main Serviceshealth education  exercise & fitness  community support services  recreation  virtual support Other Servicessupport groups Servinganyone in need adults young adults teens seniors individuals families [email or web address] to get more info  Schedule on their website  [facility] offers an array of activities for adults age 8 and over. This program provides:- Fitness and health programs- Tech classes- Activity books Main Serviceshealth education  community support services  exercise & fitness  recreation  more education Servingseniors  Call [phone]    For more resources go online to [email or web address] and type in you zipcode  [facility]  POST OPERATIVE INSTRUCTIONS FOR DR. ASHLEY AND DR. BAKER [facility] CLINIC PODIATRY DEPARTMENT   Take your medication as prescribed.  Pain medication should be taken only as needed.  Keep the dressing clean, dry and intact.  Keep your foot elevated above the heart level for the first 48 hours.  Walking to the bathroom and brief periods of walking are acceptable, unless we have instructed you to be non-weight bearing.  Always wear your post-op shoe when walking.  Always use your crutches if you are to be non-weight bearing.  Do not take a shower. Baths are permissible as [place] as the foot is kept out of the water.   Every hour you are awake:  Bend your knee 15 times. Flex foot 15 times Massage calf 15 times  Call [facility] [phone]) if any of the following problems occur: You develop a temperature or fever. The bandage becomes saturated with blood. Medication does not stop your pain. Injury of the foot occurs. Any symptoms of infection including  redness, odor, or red streaks running from wound.

## 2024-08-15 NOTE — Plan of Care (Signed)

## 2024-08-15 NOTE — Progress Notes (Signed)
 [facility] CLINIC CARDIOLOGY PROGRESS NOTE       Patient ID: Lawrence [name] MRN: [id number] DOB/AGE: [birth year] 70 y.o.  Admit date: 08/13/2024 Referring Physician Dr. Oneil Budge Primary Physician Gasper, Nancyann BRAVO, MD  Primary Cardiologist Dr. Hilarie Reason for Consultation chest pain  HPI: Lawrence [name] is a 68 y.o. male  with a past medical history of CAD (h/o STEMI s/p CABG x2 LIMA-LAD and SVG-OM1 2002 c/b coronary artery dissection with multiple subsequent PCI and stents), chronic stable angina, HFpEF (50-55%, mild LVH 10/2022) paroxysmal atrial fibrillation s/p unsuccessful DCCV 06/29/2022, COPD (PRN 2L), hx tobacco use, DM2 who presented to the ED on 08/13/2024 for chest pain. Cardiology was consulted for further evaluation.   Interval history: - Patient seen and examined this AM, resting comfortably in hospital bed.  - States he feels about the same today, reports that his chest pain is stable. - Renal function stable today.  BP and heart rate overall stable.  Did have some slow rates overnight.  Review of systems complete and found to be negative unless listed above    Past Medical History:  Diagnosis Date   A-fib (HCC)    Anemia    Anginal pain    Anxiety    Arthritis    Asthma    Atrial fibrillation, chronic (HCC) 10/19/2023   CAD (coronary artery disease)    a. 2002 CABGx2 (LIMA->LAD, VG->VG->OM1);  b. 09/2012 DES->OM;  c. 03/2015 PTCA of LAD [facility]) in setting of atretic LIMA; d. 05/2015 Cath [facility]): nonobs dzs; e. 06/2015 Cath ([facility]): LM nl, LAD 45p/d ISR, 50d, D1/2 small, LCX 50p/d ISR, OM1 70ost, 30 ISR, VG->OM1 50ost, 68m, LIMA->LAD 99p/d - atretic, RCA dom, nl; f.cath 10/16: 40-50%(FFR 0.90) pLAD, 75% (FFR 0.77) mLAD s/p PCI/DES, oRCA 40% (FFR0.95)   Cancer (HCC)    SKIN CANCER ON BACK   Celiac disease    Chronic diastolic CHF (congestive heart failure) (HCC)    a. 06/2009 Echo: EF 60-65%, Gr 1 DD, triv AI, mildly dil LA, nl RV.   COPD (chronic obstructive pulmonary  disease) (HCC)    a. Chronic bronchitis and emphysema.   DDD (degenerative disc disease), lumbar    Diverticulosis    Dysrhythmia    Essential hypertension    GERD (gastroesophageal reflux disease)    History of hiatal hernia    History of kidney stones    H/O   History of tobacco abuse    a. Quit 2014.   Myocardial infarction [facility]) 2002   4 STENTS   Pancreatitis    PSVT (paroxysmal supraventricular tachycardia)    a. 10/2012 Noted on Zio Patch.   RLL pneumonia 05/30/2024   Sleep apnea    LOST CORD TO CPAP -ONLY 02 @ BEDTIME   Tubular adenoma of colon    Type II diabetes mellitus (HCC)     Past Surgical History:  Procedure Laterality Date   BYPASS GRAFT     CARDIAC CATHETERIZATION N/A 07/12/2015   rocedure: Left Heart Cath and Cors/Grafts Angiography;  Surgeon: Victory LELON Sharps, MD;  Location: [facility] INVASIVE CV LAB;  Service: Cardiovascular;  Laterality: N/A;   CARDIAC CATHETERIZATION Right 10/07/2015   Procedure: Left Heart Cath and Cors/Grafts Angiography;  Surgeon: Denyse DELENA Bathe, MD;  Location: [facility] INVASIVE CV LAB;  Service: Cardiovascular;  Laterality: Right;   CARDIAC CATHETERIZATION N/A 04/06/2016   Procedure: Left Heart Cath and Coronary Angiography;  Surgeon: Cara JONETTA Lovelace, MD;  Location: [facility] INVASIVE CV LAB;  Service: Cardiovascular;  Laterality: N/A;   CARDIAC CATHETERIZATION  04/06/2016   Procedure: Bypass Graft Angiography;  Surgeon: Cara JONETTA Lovelace, MD;  Location: [facility] INVASIVE CV LAB;  Service: Cardiovascular;;   CARDIAC CATHETERIZATION N/A 11/02/2016   Procedure: Left Heart Cath and Cors/Grafts Angiography and possible PCI;  Surgeon: Cara JONETTA Lovelace, MD;  Location: [facility] INVASIVE CV LAB;  Service: Cardiovascular;  Laterality: N/A;   CARDIAC CATHETERIZATION N/A 11/02/2016   Procedure: Coronary Stent Intervention;  Surgeon: Cara JONETTA Lovelace, MD;  Location: [facility] INVASIVE CV LAB;  Service: Cardiovascular;  Laterality: N/A;   CARDIOVERSION N/A 06/29/2022   Procedure:  CARDIOVERSION;  Surgeon: Hester Wolm PARAS, MD;  Location: [facility] ORS;  Service: Cardiovascular;  Laterality: N/A;   CHOLECYSTECTOMY     CIRCUMCISION N/A 06/09/2019   Procedure: CIRCUMCISION ADULT;  Surgeon: Francisca Redell BROCKS, MD;  Location: [facility] ORS;  Service: Urology;  Laterality: N/A;   COLONOSCOPY WITH PROPOFOL  N/A 04/01/2018   Procedure: COLONOSCOPY WITH PROPOFOL ;  Surgeon: Viktoria Lamar DASEN, MD;  Location: [facility] ENDOSCOPY;  Service: Endoscopy;  Laterality: N/A;   COLONOSCOPY WITH PROPOFOL  N/A 05/01/2023   Procedure: COLONOSCOPY WITH PROPOFOL ;  Surgeon: Unk Corinn Skiff, MD;  Location: [facility] ENDOSCOPY;  Service: Gastroenterology;  Laterality: N/A;   ESOPHAGEAL DILATION     ESOPHAGOGASTRODUODENOSCOPY (EGD) WITH PROPOFOL  N/A 04/01/2018   Procedure: ESOPHAGOGASTRODUODENOSCOPY (EGD) WITH PROPOFOL ;  Surgeon: Viktoria Lamar DASEN, MD;  Location: [facility] ENDOSCOPY;  Service: Endoscopy;  Laterality: N/A;   ESOPHAGOGASTRODUODENOSCOPY (EGD) WITH PROPOFOL  N/A 05/01/2023   Procedure: ESOPHAGOGASTRODUODENOSCOPY (EGD) WITH PROPOFOL ;  Surgeon: Unk Corinn Skiff, MD;  Location[facility] ENDOSCOPY;  Service: Gastroenterology;  Laterality: N/A;   GIVENS CAPSULE STUDY  05/01/2023   Procedure: GIVENS CAPSULE STUDY;  Surgeon: Unk Corinn Skiff, MD;  Location: [facility] ENDOSCOPY;  Service: Gastroenterology;;   LEFT HEART CATH AND CORS/GRAFTS ANGIOGRAPHY N/A 06/12/2019   Procedure: LEFT HEART CATH AND CORS/GRAFTS ANGIOGRAPHY;  Surgeon: Bosie Vinie LABOR, MD;  Location: [facility] INVASIVE CV LAB;  Service: Cardiovascular;  Laterality: N/A;   LEFT HEART CATH AND CORS/GRAFTS ANGIOGRAPHY N/A 03/11/2020   Procedure: LEFT HEART CATH AND CORS/GRAFTS ANGIOGRAPHY;  Surgeon: Ammon Blunt, MD;  Location: [facility] INVASIVE CV LAB;  Service: Cardiovascular;  Laterality: N/A;   LEFT HEART CATH AND CORS/GRAFTS ANGIOGRAPHY N/A 05/01/2021   Procedure: LEFT HEART CATH AND CORS/GRAFTS ANGIOGRAPHY;  Surgeon: Hester Wolm PARAS, MD;  Location: [facility] INVASIVE  CV LAB;  Service: Cardiovascular;  Laterality: N/A;   LEFT HEART CATH AND CORS/GRAFTS ANGIOGRAPHY N/A 11/02/2022   Procedure: LEFT HEART CATH AND CORS/GRAFTS ANGIOGRAPHY;  Surgeon: Lovelace Cara JONETTA, MD;  Location: [facility] INVASIVE CV LAB;  Service: Cardiovascular;  Laterality: N/A;   TONSILLECTOMY     VASCULAR SURGERY      Medications Prior to Admission  Medication Sig Dispense Refill Last Dose/Taking   allopurinol  (ZYLOPRIM ) 300 MG tablet TAKE 1 TABLET BY MOUTH TWICE A DAY 180 tablet 1 08/13/2024 Morning   ALPRAZolam  (XANAX ) 1 MG tablet TAKE 1 TABLET BY MOUTH 3 TIMES A DAY AS NEEDED 90 tablet 1 Unknown   apixaban  (ELIQUIS ) 5 MG TABS tablet Take 1 tablet (5 mg total) by mouth 2 (two) times daily. 60 tablet 5 08/13/2024 Morning   atorvastatin  (LIPITOR ) 80 MG tablet TAKE 1 TABLET BY MOUTH AT BEDTIME 90 tablet 4 Past Week   BREZTRI  AEROSPHERE 160-9-4.8 MCG/ACT AERO inhaler INHALE 2 PUFFS BY MOUTH INTO THE LUNGS 2TIMES DAILY 10.7 g 11 08/13/2024 Morning   cetirizine  (ZYRTEC ) 10 MG tablet Take 1 tablet (10 mg total) by  mouth at bedtime. 90 tablet 1 Past Week   cholecalciferol  (VITAMIN D3) 25 MCG (1000 UNIT) tablet Take 1,000 Units by mouth daily.   08/13/2024 Morning   Cyanocobalamin  (VITAMIN B-12) 1000 MCG SUBL Place 1 tablet under the tongue daily.   08/13/2024 Morning   dapagliflozin  propanediol (FARXIGA ) 10 MG TABS tablet TAKE 1 TABLET BY MOUTH DAILY 30 tablet 5 08/13/2024 Morning   famotidine  (PEPCID ) 20 MG tablet Take 1 tablet (20 mg total) by mouth 2 (two) times daily. 60 tablet 2 08/13/2024 Morning   iron  polysaccharides (FERREX 150) 150 MG capsule Take one capsul every Monday, Wednesday, and Friday   Past Week   isosorbide  mononitrate (IMDUR ) 60 MG 24 hr tablet Take 60 mg by mouth daily.   08/13/2024 Morning   metFORMIN  (GLUCOPHAGE -XR) 500 MG 24 hr tablet TAKE 1 TABLET BY MOUTH DAILY WITH EVENING MEAL 60 tablet 3 Past Week   methocarbamol  (ROBAXIN ) 500 MG tablet TAKE 1 TABLET BY MOUTH EVERY 8 HOURS  AS NEEDED FOR MUSCLE SPASMS 90 tablet 3 Unknown   metoprolol  succinate (TOPROL -XL) 25 MG 24 hr tablet Take 1 tablet (25 mg total) by mouth daily.   08/13/2024   omega-3 acid ethyl esters (LOVAZA ) 1 g capsule TAKE 4 CAPSULES (4 GRAMS TOTAL) BY MOUTHDAILY 120 capsule 5 08/13/2024 Morning   oxyCODONE -acetaminophen  (PERCOCET) 10-325 MG tablet TAKE 1 TABLET BY MOUTH EVERY 4 HOURS AS NEEDED FOR PAIN 180 tablet 0 Unknown   pantoprazole  (PROTONIX ) 40 MG tablet TAKE 1 TABLET BY MOUTH 2 TIMES DAILY (30MINUTES BEFORE A MEAL) 60 tablet 0 08/13/2024 Morning   sacubitril -valsartan  (ENTRESTO ) 49-51 MG Take 1 tablet by mouth 2 (two) times daily. 60 tablet 1 08/13/2024 Morning   spironolactone  (ALDACTONE ) 25 MG tablet Take 1 tablet (25 mg total) by mouth daily.   08/13/2024 Morning   traZODone  (DESYREL ) 150 MG tablet Take 1 tablet (150 mg total) by mouth at bedtime as needed for sleep. 30 tablet 3 Past Week   Accu-Chek Softclix Lancets lancets Use as instructed to check sugar daily for type 2 diabetes. 100 each 4    albuterol  (PROVENTIL ) (2.5 MG/3ML) 0.083% nebulizer solution Take 3 mLs (2.5 mg total) by nebulization every 6 (six) hours as needed for wheezing or shortness of breath. (Patient not taking: Reported on 08/14/2024) 150 mL 2 Not Taking   Blood Glucose Monitoring Suppl (ACCU-CHEK GUIDE) w/Device KIT Use to check blood sugars as directed 1 kit 0    Blood Pressure Monitor DEVI Use to check blood pressure daily 1 each 0    furosemide  (LASIX ) 20 MG tablet Take 1 tablet (20 mg total) by mouth daily as needed. (Patient not taking: Reported on 08/14/2024) 90 tablet 1 Not Taking   glucose blood (ACCU-CHEK GUIDE) test strip Use as instructed to check sugar daily for type 2 diabetes. 100 strip 4    isosorbide  mononitrate (IMDUR ) 120 MG 24 hr tablet Take 1 tablet (120 mg total) by mouth daily. (Patient not taking: Reported on 08/14/2024) 30 tablet 11 Not Taking   lithium  carbonate 150 MG capsule Take 150 mg by mouth 2 (two)  times daily. (Patient not taking: Reported on 08/14/2024)   Not Taking   nitroGLYCERIN  (NITROSTAT ) 0.4 MG SL tablet Place 1 tablet (0.4 mg total) under the tongue every 5 (five) minutes as needed for chest pain (Do not take if blood pressure is low, systolic BP<110). (Patient not taking: Reported on 08/14/2024)   Not Taking   ondansetron  (ZOFRAN ) 4 MG tablet Take 1  tablet (4 mg total) by mouth every 8 (eight) hours as needed for nausea or vomiting. (Patient not taking: Reported on 08/14/2024) 10 tablet 0 Not Taking   OXYGEN  Inhale 2 L into the lungs at bedtime as needed (for shortness of breath).      ranolazine  (RANEXA ) 1000 MG SR tablet Take 1 tablet (1,000 mg total) by mouth 2 (two) times daily. (Patient not taking: Reported on 08/14/2024) 60 tablet 0 Not Taking   TRINTELLIX  10 MG TABS tablet Take 10 mg by mouth daily. (Patient not taking: Reported on 08/14/2024)   Not Taking   Social History   Socioeconomic History   Marital status: Widowed    Spouse name: Not on file   Number of children: 1   Years of education: Not on file   Highest education level: 10th grade  Occupational History   Occupation: Disabled   Occupation: on Tree surgeon  Tobacco Use   Smoking status: Former    Current packs/day: 0.00    Average packs/day: 3.0 packs/day for 50.0 years (150.0 ttl pk-yrs)    Types: Cigarettes    Start date: 04/23/1963    Quit date: 04/22/2013    Years since quitting: 11.3   Smokeless tobacco: Never   Tobacco comments:    Reports not smoking for approx 8 years.  Vaping Use   Vaping status: Never Used  Substance and Sexual Activity   Alcohol use: No    Comment: Hx Alcohol Abuse   Drug use: Not Currently    Types: Marijuana   Sexual activity: Not Currently  Other Topics Concern   Not on file  Social History Narrative   Pt lives in [place] with wife.  Does not routinely exercise.   Social Drivers of Corporate investment banker Strain: Low Risk  (06/19/2024)   [facility]   Overall Financial Resource Strain (CARDIA)    How hard is it for you to pay for the very basics like food, housing, medical care, and heating?: Not very hard  Food Insecurity: Food Insecurity Present (08/14/2024)   Hunger Vital Sign    Worried About Running Out of Food in the Last Year: Sometimes true    Ran Out of Food in the Last Year: Never true  Transportation Needs: No Transportation Needs (08/14/2024)   PRAPARE - Administrator, Civil Service (Medical): No    Lack of Transportation (Non-Medical): No  Physical Activity: Insufficiently Active (03/14/2024)   Exercise Vital Sign    Days of Exercise per Week: 3 days    Minutes of Exercise per Session: 20 min  Stress: No Stress Concern Present (03/14/2024)   [employer] of Occupational Health - Occupational Stress Questionnaire    Feeling of Stress : Only a little  Recent Concern: Stress - Stress Concern Present (01/13/2024)   [employer] of Occupational Health - Occupational Stress Questionnaire    Feeling of Stress : To some extent  Social Connections: Socially Isolated (08/14/2024)   Social Connection and Isolation Panel    Frequency of Communication with Friends and Family: More than three times a week    Frequency of Social Gatherings with Friends and Family: Once a week    Attends Religious Services: Never    Database administrator or Organizations: No    Attends Banker Meetings: Never    Marital Status: Widowed  Intimate Partner Violence: Not At Risk (08/14/2024)   Humiliation, Afraid, Rape, and Kick questionnaire  Fear of Current or Ex-Partner: No    Emotionally Abused: No    Physically Abused: No    Sexually Abused: No    Family History  Problem Relation Age of Onset   Heart attack Mother    Depression Mother    Heart disease Mother    COPD Mother    Hypertension Mother    Heart attack Father    Diabetes Father    Depression Father    Heart disease Father     Cirrhosis Father    Parkinson's disease Brother      Vitals:   08/14/24 1906 08/14/24 2235 08/15/24 0319 08/15/24 0754  BP: 105/83 103/66 104/75 113/84  Pulse: 85 86 77 60  Resp: 18 18 18 18   Temp: 97.8 F (36.6 C) 97.9 F (36.6 C) 97.6 F (36.4 C) 97.8 F (36.6 C)  TempSrc: Oral   Oral  SpO2: 100% 94% 92% 94%  Weight:      Height:        PHYSICAL EXAM General: Chronically ill-appearing male, well nourished, in no acute distress. HEENT: Normocephalic and atraumatic. Neck: No JVD.  Lungs: Normal respiratory effort on room air. Clear bilaterally to auscultation. No wheezes, crackles, rhonchi.  Heart: Irregularly irregular, controlled rate. Normal S1 and S2 without gallops or murmurs.  Abdomen: Non-distended appearing.  Msk: Normal strength and tone for age. Extremities: Warm and well perfused. No clubbing, cyanosis.  No edema.  Neuro: Alert and oriented X 3. Psych: Answers questions appropriately.   Labs: Basic Metabolic Panel: Recent Labs    08/14/24 0615 08/15/24 0527  NA 138 139  K 4.3 4.0  CL 101 100  CO2 27 31  GLUCOSE 178* 103*  BUN 17 18  CREATININE 1.08 0.99  CALCIUM  9.3 9.3  MG 2.1  --   PHOS 3.6  --    Liver Function Tests: Recent Labs    08/13/24 1617 08/14/24 0615  AST 24 26  ALT 22 24  ALKPHOS 54 50  BILITOT 1.8* 1.5*  PROT 6.6 6.1*  ALBUMIN 3.8 3.4*   Recent Labs    08/13/24 1617  LIPASE 28   CBC: Recent Labs    08/14/24 0615 08/15/24 0527  WBC 6.8 7.5  HGB 16.5 16.8  HCT 50.1 51.7  MCV 91.1 93.3  PLT 155 161   Cardiac Enzymes: Recent Labs    08/13/24 1617 08/13/24 1835  TROPONINIHS 16 15   BNP: Recent Labs    08/13/24 1617  BNP 82.8   D-Dimer: No results for input(s): DDIMER in the last 72 hours. Hemoglobin A1C: No results for input(s): HGBA1C in the last 72 hours.  Fasting Lipid Panel: No results for input(s): CHOL, HDL, LDLCALC, TRIG, CHOLHDL, LDLDIRECT in the last 72 hours. Thyroid   Function Tests: No results for input(s): TSH, T4TOTAL, T3FREE, THYROIDAB in the last 72 hours.  Invalid input(s): FREET3 Anemia Panel: No results for input(s): VITAMINB12, FOLATE, FERRITIN, TIBC, IRON , RETICCTPCT in the last 72 hours.   Radiology: CT ABDOMEN PELVIS W CONTRAST Result Date: 08/13/2024 CLINICAL DATA:  Right-sided flank pain right upper quadrant pain EXAM: CT ABDOMEN AND PELVIS WITH CONTRAST TECHNIQUE: Multidetector CT imaging of the abdomen and pelvis was performed using the standard protocol following bolus administration of intravenous contrast. RADIATION DOSE REDUCTION: This exam was performed according to the departmental dose-optimization program which includes automated exposure control, adjustment of the mA and/or kV according to patient size and/or use of iterative reconstruction technique. CONTRAST:  OMNIPAQUE  IOHEXOL  300 MG/ML  SOLN COMPARISON:  CT 05/29/2024, 03/11/2024 FINDINGS: Lower chest: Lung bases demonstrate no acute airspace disease. Hepatobiliary: Cholecystectomy. No biliary dilatation. No focal hepatic abnormality. Pancreas: Unremarkable. No pancreatic ductal dilatation or surrounding inflammatory changes. Spleen: Normal in size without focal abnormality. Adrenals/Urinary Tract: Stable small adrenal gland nodules measuring to 14 mm on the left, no imaging follow-up recommended. Kidneys show no hydronephrosis. The bladder is unremarkable Stomach/Bowel: The stomach is within normal limits. There is no dilated small bowel. No acute bowel wall thickening. Negative appendix. Mild diverticular disease of the left colon Vascular/Lymphatic: Moderate aortic atherosclerosis. No aneurysm. No suspicious lymph nodes. Reproductive: Mildly prominent heterogeneous prostate Other: Negative for pelvic effusion or free air Musculoskeletal: No acute or suspicious osseous abnormality. IMPRESSION: 1. No CT evidence for acute intra-abdominal or pelvic abnormality. 2.  Mild diverticular disease of the left colon without acute inflammatory process. 3. Aortic atherosclerosis. Aortic Atherosclerosis (ICD10-I70.0). Electronically Signed   By: Luke Bun M.D.   On: 08/13/2024 18:11   DG Chest Portable 1 View Result Date: 08/13/2024 CLINICAL DATA:  Chest pain and confusion. EXAM: PORTABLE CHEST 1 VIEW COMPARISON:  August 01, 2024 FINDINGS: Multiple sternal wires and vascular clips are seen. The heart size and mediastinal contours are within normal limits. A coronary artery stent is noted. Mild linear atelectasis is seen within the left lung base. No pleural effusion or pneumothorax is identified. Multilevel degenerative changes seen throughout the thoracic spine. IMPRESSION: 1. Evidence of prior median sternotomy/CABG. 2. Mild left basilar linear atelectasis. Electronically Signed   By: Suzen Dials M.D.   On: 08/13/2024 16:59   DG Chest 2 View Result Date: 08/01/2024 CLINICAL DATA:  Chest pain EXAM: CHEST - 2 VIEW COMPARISON:  07/17/2024 FINDINGS: Cardiac shadow is within normal limits. Aortic calcifications are again seen. Postsurgical changes are noted. Lungs are well aerated bilaterally. No focal infiltrate or effusion is seen. No acute bony abnormality is noted. IMPRESSION: No acute abnormality noted. Electronically Signed   By: Oneil Devonshire M.D.   On: 08/01/2024 19:45   DG Chest Port 1 View Result Date: 07/17/2024 CLINICAL DATA:  Chest pain EXAM: PORTABLE CHEST 1 VIEW COMPARISON:  None Available. FINDINGS: Lungs are well expanded, symmetric, and clear. No pneumothorax or pleural effusion. Coronary artery bypass grafting has been performed. Cardiac size is within normal limits. Pulmonary vascularity is normal. Osseous structures are age-appropriate. No acute bone abnormality. IMPRESSION: No active disease. Electronically Signed   By: Dorethia Molt M.D.   On: 07/17/2024 20:13    ECHO pending  TELEMETRY reviewed by me 08/15/2024: Atrial fibrillation rate  70s  EKG reviewed by me: Atrial fibrillation rate 82 bpm  Data reviewed by me 08/15/2024: last 24h vitals tele labs imaging I/O ED provider note, admission H&P, hospitalist progress note  Active Problems:   Mixed hyperlipidemia   Depression with anxiety   Chronic combined systolic (congestive) and diastolic (congestive) heart failure (HCC)   GERD without esophagitis   Type 2 diabetes mellitus without complications (HCC)   Essential hypertension   Atrial fibrillation, chronic (HCC)   Coronary artery disease   Chest pain   COPD (chronic obstructive pulmonary disease) (HCC)   Bipolar disorder (HCC)   Right upper quadrant abdominal pain   Elevated hemoglobin    ASSESSMENT AND PLAN:  Lawrence [name] is a 68 y.o. male  with a past medical history of CAD (h/o STEMI s/p CABG x2 LIMA-LAD and SVG-OM1 2002 c/b coronary artery dissection with multiple subsequent PCI and stents), chronic stable  angina, HFpEF (50-55%, mild LVH 10/2022) paroxysmal atrial fibrillation s/p unsuccessful DCCV 06/29/2022, COPD (PRN 2L), hx tobacco use, DM2 who presented to the ED on 08/13/2024 for chest pain. Cardiology was consulted for further evaluation.   # Chest pain # CAD s/p CABG and multiple stents with chronic angina # Chronic HFrEF # Ischemic cardiomyopathy # Persistent atrial fibrillation Patient with a history of chronic angina presented with complaints of chest discomfort, improved this AM after dose of oxycodone . Troponins normal x2. EKG without acute ischemic changes.  - No plan for further cardiac invasive evaluation at this time given patient's known coronary disease not amenable to intervention and normal troponins. - Echo pending.  - Decrease metoprolol  succinate to 12.5 mg daily given slow heart rates overnight.  Increase Ranexa  to 1000 mg twice daily.  Continue home Imdur  120 mg daily.  Likely will not tolerate higher dose of metoprolol  due to history of bradycardia. - Resume home spironolactone  25 mg  daily.  Continue Farxiga  10 mg daily, Entresto  49-51 mg twice daily.  Has not been taking Lasix  at home as he recently was having issues with dehydration.  We discussed indications for taking this as needed. - Continue Eliquis  5 mg twice daily for stroke risk reduction. - Continue atorvastatin  80 mg daily.  Ok for discharge today from a cardiac perspective. Will arrange for follow up in clinic with Dr. Hilarie in 1-2 weeks.    This patient's plan of care was discussed and created with Dr. Florencio and he is in agreement.  Signed: Danita Bloch, PA-C  08/15/2024, 10:13 AM [facility]

## 2024-08-15 NOTE — Plan of Care (Signed)
  Problem: Education: Goal: Ability to describe self-care measures that may prevent or decrease complications (Diabetes Survival Skills Education) will improve Outcome: Adequate for Discharge Goal: Individualized Educational Video(s) Outcome: Adequate for Discharge   Problem: Coping: Goal: Ability to adjust to condition or change in health will improve Outcome: Adequate for Discharge   Problem: Fluid Volume: Goal: Ability to maintain a balanced intake and output will improve Outcome: Adequate for Discharge   Problem: Health Behavior/Discharge Planning: Goal: Ability to identify and utilize available resources and services will improve 08/15/2024 1133 by Les Delon CROME, RN Outcome: Adequate for Discharge 08/15/2024 1053 by Les Delon CROME, RN Outcome: Progressing Goal: Ability to manage health-related needs will improve Outcome: Adequate for Discharge   Problem: Metabolic: Goal: Ability to maintain appropriate glucose levels will improve Outcome: Adequate for Discharge   Problem: Nutritional: Goal: Maintenance of adequate nutrition will improve 08/15/2024 1133 by Les Delon CROME, RN Outcome: Adequate for Discharge 08/15/2024 1053 by Les Delon CROME, RN Outcome: Progressing Goal: Progress toward achieving an optimal weight will improve Outcome: Adequate for Discharge   Problem: Tissue Perfusion: Goal: Adequacy of tissue perfusion will improve Outcome: Adequate for Discharge   Problem: Education: Goal: Knowledge of General Education information will improve Description: Including pain rating scale, medication(s)/side effects and non-pharmacologic comfort measures 08/15/2024 1133 by Les Delon CROME, RN Outcome: Adequate for Discharge 08/15/2024 1053 by Les Delon CROME, RN Outcome: Progressing   Problem: Health Behavior/Discharge Planning: Goal: Ability to manage health-related needs will improve Outcome: Adequate for Discharge    Problem: Clinical Measurements: Goal: Ability to maintain clinical measurements within normal limits will improve Outcome: Adequate for Discharge Goal: Will remain free from infection Outcome: Adequate for Discharge Goal: Diagnostic test results will improve Outcome: Adequate for Discharge Goal: Respiratory complications will improve Outcome: Adequate for Discharge Goal: Cardiovascular complication will be avoided Outcome: Adequate for Discharge   Problem: Activity: Goal: Risk for activity intolerance will decrease Outcome: Adequate for Discharge   Problem: Nutrition: Goal: Adequate nutrition will be maintained Outcome: Adequate for Discharge   Problem: Coping: Goal: Level of anxiety will decrease Outcome: Adequate for Discharge   Problem: Elimination: Goal: Will not experience complications related to bowel motility Outcome: Adequate for Discharge Goal: Will not experience complications related to urinary retention Outcome: Adequate for Discharge   Problem: Pain Managment: Goal: General experience of comfort will improve and/or be controlled Outcome: Adequate for Discharge   Problem: Safety: Goal: Ability to remain free from injury will improve Outcome: Adequate for Discharge   Problem: Skin Integrity: Goal: Risk for impaired skin integrity will decrease Outcome: Adequate for Discharge

## 2024-08-15 NOTE — Discharge Summary (Signed)
 Physician Discharge Summary   Patient: Lawrence [name] MRN: [id number] DOB: 1956/04/25  Admit date:     08/13/2024  Discharge date: 08/15/24  Discharge Physician: Drue ONEIDA Potter   PCP: [name], MD   Recommendations at discharge:  Follow-up with cardiology  Discharge Diagnoses: Chest pain Right upper quadrant abdominal pain CAD (coronary artery disease)  Chronic combined systolic (congestive) and diastolic (congestive) heart failure Mixed hyperlipidemia  COPD (chronic obstructive pulmonary disease)  Atrial fibrillation, chronic  Anxiety and depression Bipolar disorder Type 2 diabetes mellitus GERD  Hospital Course: Lawrence [name] is a 68 y.o. male with medical history significant of hypertension, hyperlipidemia, CAD (s/p stent x 4 and CABG  2016), chronic combined systolic and diastolic CHF,  A- fib on Eliquis , PSVT, type 2 diabetes mellitus, COPD on 2L O2, OSA not using CPAP, dCHF, anxiety, celiac disease, pancreatitis, who presents to the emergency department due to chest pain which started last night.  Chest pain was sharp, midsternal, rated as 8/10 on pain scale and was associated with tiredness and fatigue.  Chest pain started to radiate to left shoulder around 3 AM and this continued to worsen as the day progressed.  Patient also complaining of low BP with SBP in the 80s at home, he thinks that he was possibly dehydrated since he has been compliant with his Lasix  and has had some fluid taken off over the last week or 2.  Patient has extensive cardiac history, so, he activated EMS, on arrival of EMS team, aspirin  325 mg x 1 was given and his BP was noted in normal range.  He was taken to the ED for further evaluation and management.   Patient also complained of right upper abdominal pain which has been ongoing for about 2 weeks and he followed up with his PCP who had plan to have an outpatient ultrasound done.   ED Course:  In the emergency department, he was hemodynamically  stable.  Workup in the ED showed normal CBC except for elevated hemoglobin at 17.4, hematocrit 52.1, BMP was normal except for blood glucose of 113, troponin x 2 was normal, lipase 28, BNP 82.8, urinalysis was positive for glycosuria. CT abdomen and pelvis with contrast showed no CT evidence for acute intra-abdominal or pelvic abnormality.  Mild diverticular disease of the left colon without acute inflammatory process. Chest x-ray showed evidence of prior median sternotomy/CABG.  Mild left basilar linear atelectasis. Breathing treatment was provided, fentanyl  was given IV hydration  250 mL was provided. Cardiologist (Dr. Wilburn) was consulted and recommended observing patient overnight for chest pain.  Patient was seen in follow-up with cardiologist and currently chest pain is much better medications have been adjusted and patient cleared by cardiologist for discharge today and will follow-up as an outpatient.   Consultants: Cardiology Procedures performed: None Disposition: Home Diet recommendation:  Cardiac diet DISCHARGE MEDICATION: Allergies as of 08/15/2024       Reactions   Prednisone  Hypertension, Other (See Comments)   Patient said they used to think it put him in A fib but it was discovered that it doesn't. I stay in A fib all the time,. He tolerates Prednisone    Demerol  [meperidine Hcl]    Demerol [meperidine] Hives   Morphine     Pill form not tolerated. Can have fluid form   Sulfa Antibiotics Hives   Albuterol  Other (See Comments), Palpitations   Pt currently uses this medication.   Empagliflozin  Other (See Comments), Hives   Perineal pain Other  Reaction(s): Other (See Comments)   Morphine  Sulfate Nausea And Vomiting, Rash, Other (See Comments)   Pt states that he is only allergic to the tablet form of this medication.          Medication List     STOP taking these medications    furosemide  20 MG tablet Commonly known as: LASIX    Trintellix  10 MG Tabs  tablet Generic drug: vortioxetine  HBr       TAKE these medications    Accu-Chek Guide test strip Generic drug: glucose blood Use as instructed to check sugar daily for type 2 diabetes.   Accu-Chek Guide w/Device Kit Use to check blood sugars as directed   Accu-Chek Softclix Lancets lancets Use as instructed to check sugar daily for type 2 diabetes.   acetaminophen  325 MG tablet Commonly known as: TYLENOL  Take 2 tablets (650 mg total) by mouth every 6 (six) hours as needed for mild pain (pain score 1-3) or fever (or Fever >/= 101).   albuterol  (2.5 MG/3ML) 0.083% nebulizer solution Commonly known as: PROVENTIL  Take 3 mLs (2.5 mg total) by nebulization every 6 (six) hours as needed for wheezing or shortness of breath.   allopurinol  300 MG tablet Commonly known as: ZYLOPRIM  TAKE 1 TABLET BY MOUTH TWICE A DAY   ALPRAZolam  1 MG tablet Commonly known as: XANAX  TAKE 1 TABLET BY MOUTH 3 TIMES A DAY AS NEEDED   apixaban  5 MG Tabs tablet Commonly known as: Eliquis  Take 1 tablet (5 mg total) by mouth 2 (two) times daily.   atorvastatin  80 MG tablet Commonly known as: LIPITOR  TAKE 1 TABLET BY MOUTH AT BEDTIME   Blood Pressure Monitor Devi Use to check blood pressure daily   Breztri  Aerosphere 160-9-4.8 MCG/ACT Aero inhaler Generic drug: budesonide -glycopyrrolate -formoterol  INHALE 2 PUFFS BY MOUTH INTO THE LUNGS 2TIMES DAILY   cetirizine  10 MG tablet Commonly known as: ZYRTEC  Take 1 tablet (10 mg total) by mouth at bedtime.   cholecalciferol  25 MCG (1000 UNIT) tablet Commonly known as: VITAMIN D3 Take 1,000 Units by mouth daily.   famotidine  20 MG tablet Commonly known as: PEPCID  Take 1 tablet (20 mg total) by mouth 2 (two) times daily.   Farxiga  10 MG Tabs tablet Generic drug: dapagliflozin  propanediol TAKE 1 TABLET BY MOUTH DAILY   iron  polysaccharides 150 MG capsule Commonly known as: Ferrex 150 Take one capsul every Monday, Wednesday, and Friday    isosorbide  mononitrate 120 MG 24 hr tablet Commonly known as: IMDUR  Take 1 tablet (120 mg total) by mouth daily. What changed: Another medication with the same name was removed. Continue taking this medication, and follow the directions you see here.   lithium  carbonate 150 MG capsule Take 150 mg by mouth 2 (two) times daily.   metFORMIN  500 MG 24 hr tablet Commonly known as: GLUCOPHAGE -XR TAKE 1 TABLET BY MOUTH DAILY WITH EVENING MEAL   methocarbamol  500 MG tablet Commonly known as: ROBAXIN  TAKE 1 TABLET BY MOUTH EVERY 8 HOURS AS NEEDED FOR MUSCLE SPASMS   metoprolol  succinate 25 MG 24 hr tablet Commonly known as: TOPROL -XL Take 1 tablet (25 mg total) by mouth daily.   nitroGLYCERIN  0.4 MG SL tablet Commonly known as: NITROSTAT  Place 1 tablet (0.4 mg total) under the tongue every 5 (five) minutes as needed for chest pain (Do not take if blood pressure is low, systolic BP<110).   omega-3 acid ethyl esters 1 g capsule Commonly known as: LOVAZA  TAKE 4 CAPSULES (4 GRAMS TOTAL) BY MOUTHDAILY  ondansetron  4 MG tablet Commonly known as: Zofran  Take 1 tablet (4 mg total) by mouth every 8 (eight) hours as needed for nausea or vomiting.   oxyCODONE -acetaminophen  10-325 MG tablet Commonly known as: PERCOCET TAKE 1 TABLET BY MOUTH EVERY 4 HOURS AS NEEDED FOR PAIN   OXYGEN  Inhale 2 L into the lungs at bedtime as needed (for shortness of breath).   pantoprazole  40 MG tablet Commonly known as: PROTONIX  TAKE 1 TABLET BY MOUTH 2 TIMES DAILY (30MINUTES BEFORE A MEAL)   ranolazine  1000 MG SR tablet Commonly known as: RANEXA  Take 1 tablet (1,000 mg total) by mouth 2 (two) times daily.   sacubitril -valsartan  49-51 MG Commonly known as: Entresto  Take 1 tablet by mouth 2 (two) times daily.   spironolactone  25 MG tablet Commonly known as: ALDACTONE  Take 1 tablet (25 mg total) by mouth daily.   traZODone  150 MG tablet Commonly known as: DESYREL  Take 1 tablet (150 mg total) by  mouth at bedtime as needed for sleep.   Vitamin B-12 1000 MCG Subl Place 1 tablet under the tongue daily.        Follow-up Information     Hilarie Rocher, MD. Go on 09/07/2024.   Specialty: Cardiology Why: Hospital Follow-Up: October 23rd @ 10:230 AM Contact information: [address] [place] [address] [phone]                Discharge Exam: Lawrence [name]   08/13/24 1621  Weight: 83 kg   Cardiovascular: Regular rate and rhythm with no rubs or gallops.  No thyromegaly or JVD noted.  No lower extremity edema. 2/4 pulses in all 4 extremities. Respiratory: Clear to auscultation with no wheezes or rales. Good inspiratory effort. Abdomen: Soft, mild tenderness to palpation of RUQ and right CVA without guarding.  Nondistended with normal bowel sounds x4 quadrants. Muskuloskeletal: No cyanosis, clubbing or edema noted bilaterally Neuro: CN II-XII intact, strength 5/5 x 4, sensation, reflexes intact Skin: No ulcerative lesions noted or rashes Psychiatry: Judgement and insight appear normal. Mood is appropriate for condition and setting  Condition at discharge: good  The results of significant diagnostics from this hospitalization (including imaging, microbiology, ancillary and laboratory) are listed below for reference.   Imaging Studies: CT ABDOMEN PELVIS W CONTRAST Result Date: 08/13/2024 CLINICAL DATA:  Right-sided flank pain right upper quadrant pain EXAM: CT ABDOMEN AND PELVIS WITH CONTRAST TECHNIQUE: Multidetector CT imaging of the abdomen and pelvis was performed using the standard protocol following bolus administration of intravenous contrast. RADIATION DOSE REDUCTION: This exam was performed according to the departmental dose-optimization program which includes automated exposure control, adjustment of the mA and/or kV according to patient size and/or use of iterative reconstruction technique. CONTRAST:  OMNIPAQUE  IOHEXOL  300 MG/ML  SOLN COMPARISON:   CT 05/29/2024, 03/11/2024 FINDINGS: Lower chest: Lung bases demonstrate no acute airspace disease. Hepatobiliary: Cholecystectomy. No biliary dilatation. No focal hepatic abnormality. Pancreas: Unremarkable. No pancreatic ductal dilatation or surrounding inflammatory changes. Spleen: Normal in size without focal abnormality. Adrenals/Urinary Tract: Stable small adrenal gland nodules measuring to 14 mm on the left, no imaging follow-up recommended. Kidneys show no hydronephrosis. The bladder is unremarkable Stomach/Bowel: The stomach is within normal limits. There is no dilated small bowel. No acute bowel wall thickening. Negative appendix. Mild diverticular disease of the left colon Vascular/Lymphatic: Moderate aortic atherosclerosis. No aneurysm. No suspicious lymph nodes. Reproductive: Mildly prominent heterogeneous prostate Other: Negative for pelvic effusion or free air Musculoskeletal: No acute or suspicious osseous abnormality. IMPRESSION: 1.  No CT evidence for acute intra-abdominal or pelvic abnormality. 2. Mild diverticular disease of the left colon without acute inflammatory process. 3. Aortic atherosclerosis. Aortic Atherosclerosis (ICD10-I70.0). Electronically Signed   By: Luke Bun M.D.   On: 08/13/2024 18:11   DG Chest Portable 1 View Result Date: 08/13/2024 CLINICAL DATA:  Chest pain and confusion. EXAM: PORTABLE CHEST 1 VIEW COMPARISON:  August 01, 2024 FINDINGS: Multiple sternal wires and vascular clips are seen. The heart size and mediastinal contours are within normal limits. A coronary artery stent is noted. Mild linear atelectasis is seen within the left lung base. No pleural effusion or pneumothorax is identified. Multilevel degenerative changes seen throughout the thoracic spine. IMPRESSION: 1. Evidence of prior median sternotomy/CABG. 2. Mild left basilar linear atelectasis. Electronically Signed   By: Suzen Dials M.D.   On: 08/13/2024 16:59   DG Chest 2 View Result Date:  08/01/2024 CLINICAL DATA:  Chest pain EXAM: CHEST - 2 VIEW COMPARISON:  07/17/2024 FINDINGS: Cardiac shadow is within normal limits. Aortic calcifications are again seen. Postsurgical changes are noted. Lungs are well aerated bilaterally. No focal infiltrate or effusion is seen. No acute bony abnormality is noted. IMPRESSION: No acute abnormality noted. Electronically Signed   By: Oneil Devonshire M.D.   On: 08/01/2024 19:45   DG Chest Port 1 View Result Date: 07/17/2024 CLINICAL DATA:  Chest pain EXAM: PORTABLE CHEST 1 VIEW COMPARISON:  None Available. FINDINGS: Lungs are well expanded, symmetric, and clear. No pneumothorax or pleural effusion. Coronary artery bypass grafting has been performed. Cardiac size is within normal limits. Pulmonary vascularity is normal. Osseous structures are age-appropriate. No acute bone abnormality. IMPRESSION: No active disease. Electronically Signed   By: Dorethia Molt M.D.   On: 07/17/2024 20:13    Microbiology: Results for orders placed or performed during the hospital encounter of 08/01/24  Resp panel by RT-PCR (RSV, Flu A&B, Covid) Anterior Nasal Swab     Status: None   Collection Time: 08/01/24  7:16 PM   Specimen: Anterior Nasal Swab  Result Value Ref Range Status   SARS Coronavirus 2 by RT PCR NEGATIVE NEGATIVE Final    Comment: (NOTE) SARS-CoV-2 target nucleic acids are NOT DETECTED.  The SARS-CoV-2 RNA is generally detectable in upper respiratory specimens during the acute phase of infection. The lowest concentration of SARS-CoV-2 viral copies this assay can detect is 138 copies/mL. A negative result does not preclude SARS-Cov-2 infection and should not be used as the sole basis for treatment or other patient management decisions. A negative result may occur with  improper specimen collection/handling, submission of specimen other than nasopharyngeal swab, presence of viral mutation(s) within the areas targeted by this assay, and inadequate number of  viral copies(<138 copies/mL). A negative result must be combined with clinical observations, patient history, and epidemiological information. The expected result is Negative.  Fact Sheet for Patients:  BloggerCourse.com  Fact Sheet for Healthcare Providers:  SeriousBroker.it  This test is no t yet approved or cleared by the [place]  FDA and  has been authorized for detection and/or diagnosis of SARS-CoV-2 by FDA under an Emergency Use Authorization (EUA). This EUA will remain  in effect (meaning this test can be used) for the duration of the COVID-19 declaration under Section 564(b)(1) of the Act, 21 U.S.C.section 360bbb-3(b)(1), unless the authorization is terminated  or revoked sooner.       Influenza A by PCR NEGATIVE NEGATIVE Final   Influenza B by PCR NEGATIVE NEGATIVE Final  Comment: (NOTE) The Xpert Xpress SARS-CoV-2/FLU/RSV plus assay is intended as an aid in the diagnosis of influenza from Nasopharyngeal swab specimens and should not be used as a sole basis for treatment. Nasal washings and aspirates are unacceptable for Xpert Xpress SARS-CoV-2/FLU/RSV testing.  Fact Sheet for Patients: BloggerCourse.com  Fact Sheet for Healthcare Providers: SeriousBroker.it  This test is not yet approved or cleared by the [place]  FDA and has been authorized for detection and/or diagnosis of SARS-CoV-2 by FDA under an Emergency Use Authorization (EUA). This EUA will remain in effect (meaning this test can be used) for the duration of the COVID-19 declaration under Section 564(b)(1) of the Act, 21 U.S.C. section 360bbb-3(b)(1), unless the authorization is terminated or revoked.     Resp Syncytial Virus by PCR NEGATIVE NEGATIVE Final    Comment: (NOTE) Fact Sheet for Patients: BloggerCourse.com  Fact Sheet for Healthcare  Providers: SeriousBroker.it  This test is not yet approved or cleared by the [place]  FDA and has been authorized for detection and/or diagnosis of SARS-CoV-2 by FDA under an Emergency Use Authorization (EUA). This EUA will remain in effect (meaning this test can be used) for the duration of the COVID-19 declaration under Section 564(b)(1) of the Act, 21 U.S.C. section 360bbb-3(b)(1), unless the authorization is terminated or revoked.  Performed at [facility], [address] [place], [place] [address]    *Note: Due to a large number of results and/or encounters for the requested time period, some results have not been displayed. A complete set of results can be found in Results Review.    Labs: CBC: Recent Labs  Lab 08/13/24 1617 08/14/24 0615 08/15/24 0527  WBC 9.7 6.8 7.5  HGB 17.4* 16.5 16.8  HCT 52.1* 50.1 51.7  MCV 91.1 91.1 93.3  PLT 182 155 161   Basic Metabolic Panel: Recent Labs  Lab 08/11/24 1404 08/13/24 1617 08/14/24 0615 08/15/24 0527  NA 143 139 138 139  K 4.8 4.4 4.3 4.0  CL 103 100 101 100  CO2 24 27 27 31   GLUCOSE 105* 113* 178* 103*  BUN 17 16 17 18   CREATININE 1.14 1.00 1.08 0.99  CALCIUM  10.2 9.6 9.3 9.3  MG  --   --  2.1  --   PHOS  --   --  3.6  --    Liver Function Tests: Recent Labs  Lab 08/11/24 1404 08/13/24 1617 08/14/24 0615  AST 17 24 26   ALT 21 22 24   ALKPHOS 77 54 50  BILITOT 0.9 1.8* 1.5*  PROT 6.5 6.6 6.1*  ALBUMIN 4.5 3.8 3.4*   CBG: Recent Labs  Lab 08/14/24 1700 08/14/24 2037 08/15/24 0030 08/15/24 0428 08/15/24 0755  GLUCAP 109* 179* 117* 151* 97    Discharge time spent:  38 minutes.  Signed: Drue ONEIDA Potter, MD [facility] 08/15/2024

## 2024-08-15 NOTE — Care Management Obs Status (Signed)
 MEDICARE OBSERVATION STATUS NOTIFICATION   Patient Details  Name: Lawrence [name] MRN: [id number] Date of Birth: 05/25/1956   Medicare Observation Status Notification Given:  Yes    [name] 08/15/2024, 9:52 AM

## 2024-08-15 NOTE — Plan of Care (Signed)
  Problem: Health Behavior/Discharge Planning: Goal: Ability to identify and utilize available resources and services will improve Outcome: Progressing   Problem: Nutritional: Goal: Maintenance of adequate nutrition will improve Outcome: Progressing   Problem: Education: Goal: Knowledge of General Education information will improve Description: Including pain rating scale, medication(s)/side effects and non-pharmacologic comfort measures Outcome: Progressing

## 2024-08-15 NOTE — TOC CM/SW Note (Signed)
 Transition of Care [facility]) - Inpatient Brief Assessment   Patient Details  Name: Lawrence [name] MRN: [id number] Date of Birth: 12-02-1955  [facility] CM/SW Contact:    Lauraine JAYSON Carpen, LCSW Phone Number: 08/15/2024, 11:20 AM   Clinical Narrative: Patient has orders to discharge home today. Chart reviewed. No TOC needs identified. CSW signing off.  Transition of Care Asessment: Insurance and Status: Insurance coverage has been reviewed Patient has primary care physician: Yes Home environment has been reviewed: Apartment Prior level of function:: Not documented Prior/Current Home Services: No current home services Social Drivers of Health Review: SDOH reviewed interventions complete Readmission risk has been reviewed: Yes Transition of care needs: no transition of care needs at this time

## 2024-08-16 ENCOUNTER — Emergency Department

## 2024-08-16 ENCOUNTER — Emergency Department
Admission: EM | Admit: 2024-08-16 | Discharge: 2024-08-16 | Disposition: A | Discharge status: Home / Self Care | Attending: Emergency Medicine | Admitting: Emergency Medicine

## 2024-08-16 ENCOUNTER — Ambulatory Visit: Payer: Self-pay

## 2024-08-16 ENCOUNTER — Other Ambulatory Visit: Payer: Self-pay

## 2024-08-16 ENCOUNTER — Telehealth

## 2024-08-16 DIAGNOSIS — I110 Hypertensive heart disease with heart failure: Secondary | ICD-10-CM | POA: Insufficient documentation

## 2024-08-16 DIAGNOSIS — R059 Cough, unspecified: Secondary | ICD-10-CM | POA: Insufficient documentation

## 2024-08-16 DIAGNOSIS — I2510 Atherosclerotic heart disease of native coronary artery without angina pectoris: Secondary | ICD-10-CM | POA: Diagnosis not present

## 2024-08-16 DIAGNOSIS — J449 Chronic obstructive pulmonary disease, unspecified: Secondary | ICD-10-CM | POA: Insufficient documentation

## 2024-08-16 DIAGNOSIS — I509 Heart failure, unspecified: Secondary | ICD-10-CM | POA: Insufficient documentation

## 2024-08-16 DIAGNOSIS — R0789 Other chest pain: Secondary | ICD-10-CM | POA: Insufficient documentation

## 2024-08-16 DIAGNOSIS — R079 Chest pain, unspecified: Secondary | ICD-10-CM | POA: Diagnosis present

## 2024-08-16 DIAGNOSIS — R0602 Shortness of breath: Secondary | ICD-10-CM | POA: Insufficient documentation

## 2024-08-16 DIAGNOSIS — E119 Type 2 diabetes mellitus without complications: Secondary | ICD-10-CM | POA: Diagnosis not present

## 2024-08-16 LAB — TROPONIN I (HIGH SENSITIVITY)
Troponin I (High Sensitivity): 13 ng/L (ref <18)
Troponin I (High Sensitivity): 13 ng/L (ref <18)

## 2024-08-16 LAB — CBC
HCT: 49.7 % (ref 39.0–52.0)
Hemoglobin: 16 g/dL (ref 13.0–17.0)
MCH: 30.1 pg (ref 26.0–34.0)
MCHC: 32.2 g/dL (ref 30.0–36.0)
MCV: 93.4 fL (ref 80.0–100.0)
Platelets: 147 K/uL — ABNORMAL LOW (ref 150–400)
RBC: 5.32 MIL/uL (ref 4.22–5.81)
RDW: 14.2 % (ref 11.5–15.5)
WBC: 7.9 K/uL (ref 4.0–10.5)
nRBC: 0 % (ref 0.0–0.2)

## 2024-08-16 LAB — BASIC METABOLIC PANEL WITH GFR
Anion gap: 10 (ref 5–15)
BUN: 18 mg/dL (ref 8–23)
CO2: 27 mmol/L (ref 22–32)
Calcium: 9.2 mg/dL (ref 8.9–10.3)
Chloride: 102 mmol/L (ref 98–111)
Creatinine, Ser: 1.1 mg/dL (ref 0.61–1.24)
GFR, Estimated: >60 mL/min (ref >60)
Glucose, Bld: 161 mg/dL — ABNORMAL HIGH (ref 70–99)
Potassium: 3.7 mmol/L (ref 3.5–5.1)
Sodium: 139 mmol/L (ref 135–145)

## 2024-08-16 MED ORDER — ONDANSETRON HCL 4 MG/2ML IJ SOLN
4.0000 mg | Freq: Once | INTRAMUSCULAR | Status: AC
Start: 2024-08-16 — End: 2024-08-16
  Administered 2024-08-16: 4 mg via INTRAVENOUS
  Filled 2024-08-16: qty 2

## 2024-08-16 MED ORDER — SODIUM CHLORIDE 0.9 % IV BOLUS
500.0000 mL | Freq: Once | INTRAVENOUS | Status: AC
  Administered 2024-08-16: 500 mL via INTRAVENOUS

## 2024-08-16 MED ORDER — FENTANYL CITRATE PF 50 MCG/ML IJ SOSY
50.0000 ug | PREFILLED_SYRINGE | Freq: Once | INTRAMUSCULAR | Status: AC
  Administered 2024-08-16: 50 ug via INTRAVENOUS
  Filled 2024-08-16: qty 1

## 2024-08-16 MED ORDER — LABETALOL HCL 5 MG/ML IV SOLN
10.0000 mg | Freq: Once | INTRAVENOUS | Status: AC
Start: 2024-08-16 — End: 2024-08-16
  Administered 2024-08-16: 10 mg via INTRAVENOUS
  Filled 2024-08-16: qty 4

## 2024-08-16 NOTE — ED Triage Notes (Signed)
 FIRST RN: pt from home- c/o CP/SOB with some wheezing. Took 3NTG pta, was given 125solumedrol and 1 neb. Pt reports d/c from hospital yesterday.  Afib [id number]

## 2024-08-16 NOTE — ED Triage Notes (Signed)
 Patient states chest pain and hypertension; just discharged from here yesterday for the same. Patient took 3 SL NTG prior to arrival.

## 2024-08-16 NOTE — Telephone Encounter (Signed)
 FYI Only or Action Required?: FYI only for provider.  Patient was last seen in primary care on 08/11/2024 by Lawrence Nancyann BRAVO, MD.  Called Nurse Triage reporting URI.  Symptoms began yesterday.  Interventions attempted: Rest, hydration, or home remedies.  Symptoms are: gradually worsening.  Triage Disposition: Go to ED Now (Notify PCP)  Patient/caregiver understands and will follow disposition?: Yes Reason for Disposition  [1] MODERATE difficulty breathing (e.g., speaks in phrases, SOB even at rest, pulse 100-120) AND [2] NEW-onset or WORSE than normal  Answer Assessment - Initial Assessment Questions Patient reports covid negative. Reports nasal congestion and mild cough. Denies extremity edema. Endorses difficulty breathing with and without exertion that is more severe than patient's baseline. Denies wheezing.  Patient's grandson states that transport him from the hospital is problematic d/t the patient not having a wheeled cart for his portable O2 monitor or a portable O2 concentrator.  Patient requires 3 day notice for transportation to PCP office. Patient's grandson stated that they have no one who can transport him to be seen anywhere today.  Due to comorbidities and increased SOB from baseline, he was triaged to the ED via EMS.     1. ONSET: When did the nasal discharge start?      This morning, patient's grandson has the same symptoms  2. COUGH: Do you have a cough? If Yes, ask: Describe the color of your mucus. (e.g., clear, white, yellow, green)     Mild, productive  3. RESPIRATORY DISTRESS: Describe your breathing.      Mild, but states is increased from baseline. Described as difficulty catching his breath  4. FEVER: Do you have a fever? If Yes, ask: What is your temperature, how was it measured, and when did it start?     Denies  5. OTHER SYMPTOMS: Do you have any other symptoms? (e.g., earache, mouth sores, sore throat, wheezing)      Denies  Protocols used: Common Cold-A-AH, Breathing Difficulty-A-AH Copied from CRM #[id number]. Topic: Clinical - Medication Question >> Aug 16, 2024  2:48 PM Lawrence [name] wrote: Reason for CRM: pt is calling because he would like to know what cold medicine he can take. Alkaseltzer plus cold medicine for cough/mucus gels. Please advise. [phone]

## 2024-08-16 NOTE — Discharge Instructions (Signed)
 Continue taking the medications that you are prescribed with the changes made on your last hospitalization.  Follow-up with your primary care doctor and cardiologist.  Return to the ER immediately for new, or worsening chest pain, difficulty breathing, dizziness or lightheadedness, or any other new or worsening symptoms that concern you.

## 2024-08-16 NOTE — ED Provider Notes (Signed)
 [facility] Provider Note    Event Date/Time   First MD Initiated Contact with Patient 08/16/24 1643     (approximate)   History   Hypertension   HPI  Lawrence [name] is a 68 y.o. male with a history of CAD, CHF, atrial fibrillation, hyperlipidemia, COPD type 2 diabetes, anxiety, depression, and bipolar disorder who presents with chest pain, shortness of breath, and cough.  The patient states that the symptoms have persisted since he was recently admitted and are unchanged.  He has no new leg swelling.  He states he took his blood pressure medications today but his blood pressure was elevated.  I reviewed the past medical records.  The patient has numerous prior ED visits and admissions.  He was last admitted to the hospitalist service from 9/28 until he was discharged yesterday with acute on chronic chest pain.  Workup was reassuring but cardiology recommended overnight observation for chest pain.  Previously he was seen in the ED on 9/16 with cough and shortness of breath,  On 9/11 at [facility] with chest pain, and on 9/1 here also with chest pain, all with negative workups.   Physical Exam   Triage Vital Signs: ED Triage Vitals  Encounter Vitals Group     BP 08/16/24 1622 (!) 166/105     Girls Systolic BP Percentile --      Girls Diastolic BP Percentile --      Boys Systolic BP Percentile --      Boys Diastolic BP Percentile --      Pulse Rate 08/16/24 1622 79     Resp 08/16/24 1622 18     Temp 08/16/24 1622 97.8 F (36.6 C)     Temp Source 08/16/24 1622 Oral     SpO2 08/16/24 1622 100 %     Weight 08/16/24 1621 177 lb (80.3 kg)     Height 08/16/24 1621 5' 7 (1.702 m)     Head Circumference --      Peak Flow --      Pain Score 08/16/24 1621 8     Pain Loc --      Pain Education --      Exclude from Growth Chart --     Most recent vital signs: Vitals:   08/16/24 2130 08/16/24 2200  BP: (!) 166/89 (!) 163/103  Pulse: 79 (!) 101  Resp: 10 15   Temp:    SpO2: 100% 100%    General: Alert, comfortable appearing, no distress.  CV:  Good peripheral perfusion.  Normal heart sounds. Resp:  Normal effort.  Somewhat diminished breath sounds bilaterally.  No wheezes or rales. Abd:  No distention.  Other:  No peripheral edema.   ED Results / Procedures / Treatments   Labs (all labs ordered are listed, but only abnormal results are displayed) Labs Reviewed  BASIC METABOLIC PANEL WITH GFR - Abnormal; Notable for the following components:      Result Value   Glucose, Bld 161 (*)    All other components within normal limits  CBC - Abnormal; Notable for the following components:   Platelets 147 (*)    All other components within normal limits  TROPONIN I (HIGH SENSITIVITY)  TROPONIN I (HIGH SENSITIVITY)     EKG  ED ECG REPORT I, Waylon Cassis, the attending physician, personally viewed and interpreted this ECG.  Date: 08/16/2024 EKG Time: 1626 Rate: 85 Rhythm: Atrial fibrillation QRS Axis: normal Intervals: normal ST/T Wave abnormalities: normal Narrative  Interpretation: no evidence of acute ischemia    RADIOLOGY  Chest x-ray: I independently viewed and interpreted the images; there is no focal consolidation or edema  PROCEDURES:  Critical Care performed: No  Procedures   MEDICATIONS ORDERED IN ED: Medications  labetalol  (NORMODYNE ) injection 10 mg (10 mg Intravenous Given 08/16/24 1817)  ondansetron  (ZOFRAN ) injection 4 mg (4 mg Intravenous Given 08/16/24 2036)  fentaNYL  (SUBLIMAZE ) injection 50 mcg (50 mcg Intravenous Given 08/16/24 2105)  sodium chloride  0.9 % bolus 500 mL (0 mLs Intravenous Stopped 08/16/24 2145)     IMPRESSION / MDM / ASSESSMENT AND PLAN / ED COURSE  I reviewed the triage vital signs and the nursing notes.  68 year old male with PMH as noted above including CAD as well as chronic chest pain with multiple ED visits and admissions presents with persistent chest pain, shortness of  breath, and hypertension as well as a cough.  On exam he is overall comfortable appearing.  He is hypertensive.  Other vital signs are normal.  Physical exam is unremarkable for acute abnormalities.  EKG is unchanged from prior.  Differential diagnosis includes, but is not limited to, exacerbation of chronic chest pain, acute bronchitis, bronchospasm, COPD, pneumonia, musculoskeletal pain, GERD, less likely ACS.  Given the patient's elevated risk we will obtain basic labs, cardiac enzymes, chest x-ray, and reassess.  I have ordered IV labetalol  for blood pressure control since his heart rate is in the 90s.  Patient's presentation is most consistent with acute presentation with potential threat to life or bodily function.  The patient is on the cardiac monitor to evaluate for evidence of arrhythmia and/or significant heart rate changes.  ----------------------------------------- 10:51 PM on 08/16/2024 -----------------------------------------  The patient's workup is very reassuring.  Troponins are negative x 2.  BMP and CBC show no acute findings.  Chest x-ray is clear.  His blood pressure improved significantly with the labetalol , going down to as low as the 130s.  When I went to reassess him his blood pressure was in the 150s with a heart rate in the 70s.  The patient stated that his chest pain was somewhat better but then reported some lightheadedness and tingling in his body.  He stated that he wanted to be readmitted to the hospital, although there is no clear indication for admission.  I reviewed the cardiology note from the last admission from [facility], noting in part Patient with a history of chronic angina presented with complaints of chest discomfort, improved this AM after dose of oxycodone . Troponins normal x2. EKG without acute ischemic changes.  - No plan for further cardiac invasive evaluation at this time given patient's known coronary disease not amenable to intervention and normal  troponins.  I consulted Dr. Florencio from cardiology.  He advised that based on the negative workup and the cardiology evaluation just this week, he did not recommend inpatient admission as there would be no additional workup.  I also discussed the case with Dr. Dorinda from the hospitalist service who is similar advised that there would be no specific treatment given in the hospital.  At this time based on the negative workup there is no indication for hospitalization.  There is no indication for further ED workup.  There is no clinical evidence for PE, aortic dissection, or other new or acute cause of the chest pain.  It is consistent with his multiple prior presentations.  I counseled the patient on the results of the workup and on the cardiology recommendations.  He  states he feels comfortable going home.  He is stable for discharge at this time.  I gave strict return precautions, and he expressed understanding.   FINAL CLINICAL IMPRESSION(S) / ED DIAGNOSES   Final diagnoses:  Atypical chest pain  Shortness of breath     Rx / DC Orders   ED Discharge Orders     None        Note:  This document was prepared using Dragon voice recognition software and may include unintentional dictation errors.    Jacolyn Pae, MD 08/16/24 2253

## 2024-08-17 ENCOUNTER — Other Ambulatory Visit: Payer: Self-pay | Admitting: Family Medicine

## 2024-08-17 DIAGNOSIS — I10 Essential (primary) hypertension: Secondary | ICD-10-CM

## 2024-08-18 ENCOUNTER — Ambulatory Visit

## 2024-08-23 ENCOUNTER — Other Ambulatory Visit: Payer: Self-pay | Admitting: Family Medicine

## 2024-08-24 ENCOUNTER — Ambulatory Visit

## 2024-08-24 NOTE — Progress Notes (Deleted)
 S:     No chief complaint on file.  68 y.o. male who presents for hypertension evaluation, education, and management.   Patient was referred and last seen by Primary Care Provider, Dr. Gasper, on 06/23/24.    Since last visit with clinical pharmacist on 07/27/24, patient has has numerous ED visits and hospitalizations.   At last visit with PCP on 08/12/24, spironolactone  was increased to 1 full tablet daily.   PMH is significant for T2DM, BPH, HLD, depression, anxiety, COPD, OSA, PAD, paroxysmal A-fib, CAD s/p CABG x 2, and HFimpEF.   Today, patient arrives in good spirits and presents with a walker. Reports issues with dizziness at baseline as well as chest pain. Reports a slight headache today. Denies jaw pain or n/v.    Patient also reports right calf pain. Denies any change in pain with activity.   Medication adherence good . Patient reports taking blood pressure medications today.   Current antihypertensives include: Entresto  24/26 mg BID, Toprol  XL 25 mg daily, spironolactone  25 mg daily  Reported home blood pressure readings: 140-190/90-110 *of note, have one BP reading with a systolic of 149 mmHg last month. Patient reported calling paramedic who verified that BP reading was inaccurate. BP cuff has since stopped worked.   Patient-reported exercise habits: no formal exercise; mainly walks around the house  O:   Physical exam: -right calf: not warm to the touch, no redness.   Last 3 Office BP readings: BP Readings from Last 3 Encounters:  08/16/24 (!) 166/106  08/15/24 (!) 146/96  08/11/24 (!) 138/98    BMET    Component Value Date/Time   NA 139 08/16/2024 1623   NA 143 08/11/2024 1404   NA 143 03/07/2015 1851   K 3.7 08/16/2024 1623   K 3.9 03/07/2015 1851   CL 102 08/16/2024 1623   CL 107 03/07/2015 1851   CO2 27 08/16/2024 1623   CO2 29 03/07/2015 1851   GLUCOSE 161 (H) 08/16/2024 1623   GLUCOSE 99 03/07/2015 1851   BUN 18 08/16/2024 1623   BUN 17  08/11/2024 1404   BUN 9 03/07/2015 1851   CREATININE 1.10 08/16/2024 1623   CREATININE 0.84 03/07/2015 1851   CALCIUM  9.2 08/16/2024 1623   CALCIUM  9.6 03/07/2015 1851   GFRNONAA >60 08/16/2024 1623   GFRNONAA >60 03/07/2015 1851   GFRAA 97 09/24/2020 1105   GFRAA >60 03/07/2015 1851    Renal function: Estimated Creatinine Clearance: 63.5 mL/min (by C-G formula based on SCr of 1.1 mg/dL).  Clinical ASCVD: Yes  The ASCVD Risk score (Arnett DK, et al., 2019) failed to calculate for the following reasons:   Risk score cannot be calculated because patient has a medical history suggesting prior/existing ASCVD  A/P: Hypertension diagnosis currently uncontrolled on current medications. BP goal < 130/80 mmHg. Medication adherence appears appropriate. Home BP readings have been elevated, but patient reports cuff stopped working and was likely reporting inaccurate readings. However, BP in clinic was elevated today and has been for the past ED encounters. Will adjust RAAS agent at this time.  -Increased dose of Entresto  to 49/51 mg BID. -Continue metoprolol  succinate 25 mg daily -Continued  furosemide  20 mg daily -Continued spironolactone  25 mg daily -Counseled on lifestyle modifications for blood pressure control including reduced dietary sodium, increased exercise, adequate sleep. -Encouraged patient to check BP at home and bring log of readings to next visit. Counseled on proper use of home BP cuff. Will send new prescription for BP cuff.  Print and bring to [facility]. Patient to bring cuff to follow up appointment to verify accuracy.  -Discussed for patient to present to ED if chest pain worsens or with any vision changes or new symptoms. Counseled for patient to present to ED if leg pain worsens or any new symptoms present (I.e. redness, warmth, etc)  Results reviewed and written information provided.    Written patient instructions provided. Patient verbalized understanding of  treatment plan.  Total time in face to face counseling *** minutes.    Follow-up:  Cardiology: *** Pharmacist *** PCP clinic visit ***  Peyton CHARLENA Ferries, PharmD Clinical Pharmacist [facility] [phone]

## 2024-08-24 NOTE — Progress Notes (Deleted)
 Advanced Heart Failure Clinic Note    PCP: [name], MD  Cardiologist: [facility] cardiology (last seen 06/25)  Chief Complaint: shortness of breath   HPI:  Lawrence [name] is a 68 y/o male with a history of paroxysmal atrial fibrillation, coronary artery disease s/p CABG X2 with LIMA to the LAD and SVG to OM (2002); procedure was complicated by coronary artery dissection, asthma, osteoarthritis, hyperlipidemia, GERD, anxiety, DDD, diverticulosis, COPD, CKD essential hypertension and PSVT, type II diabetes mellitus, OSA & chronic heart failure. S/p PCI/DES to mid LAD with subsequent PTCA (2016), proximal to mid LAD stents (2014), distal LAD stent (unknown date), stent in the ostium SVG (unknown date) & circumflex/OM 1 bifucation stent (2013).   Has had 11 caths since 2014  Echo 05/01/23: EF 50-55% with mild LVH and mild/ moderate LAE  Was in the ED 05/14/23 due to 20 episodes of diarrhea. Abdominal CT negative and negative for e coli. Antibiotics provided.   Admitted 05/16/23 due to acute onset of midsternal and left-sided chest pain felt as tightness and graded 9/10 in severity with no radiation however with associated nausea as well as dyspnea and palpitations. Per Dr. Ammon, likely that one of the patient's bypass grafts has occluded with his recent NSTEMI (per his review of films, unlikely to be amenable to an intervention. IV diuresed. Has had 7 ED visits July 2024, 2 ED visits Aug 2024.   Echo 06/10/24: EF 45-50%, mild LVH, G1DD  Admitted 07/11/23 due to acute onset of midsternal chest pain, nausea and diaphoresis. He described the sensation as an elephant sitting on his chest. He also complained of cough productive of yellowish sputum and wheezing. Was recently treated for covid for which he was given molnupiravir  & completed 07/13/23. Chest pain, mildly elevated but flat troponins: He was seen by the cardiologist. Chest pain thought to be noncardiac in etiology. Cardiology consult done.     Since seen in [facility] 09/24, he has been admitted 12 times and been in the ED 48 times. Majority of times has been for chest pain.   Seen in [facility] 07/03/24 where midodrine  was decreased for 3 days and then was to be stopped due to elevated BP.   Seen in ED 07/07/24 with HA and elevated BP. CT head without acute bleed. EKG and troponin x 2 minimally elevated, stable compared to priors.   Seen in ED 07/12/24 with chest pain. Troponins stable. Had an episode of slow Afib to 30s, with dec in BP, worsening chest pain, weakness, feeling of doom. Given metoprolol  25mg  daily.   Seen in ED 07/17/24 with chest pain X 2 days. Normal CBC, BMET, troponins chronically elevated. EKG without acute ischemic changes. Symptoms improved and discharged.  Seen in ED 07/27/24 with chest pain. Found to have HF exacerbation and extra lasix  given.     He presents today for a HF f/u visit with a chief complaint of shortness of breath. Has associated fatigue, pedal edema (worsening), chest pain, palpitations, dizziness, frontal headache, not sleeping well due to headache. Has had a few ER visits with chest pain. Has [facility] cardiology appointment scheduled next week. He shows me his med list and does not have entresto / spironolactone  on it although he says that he's pretty sure he's taking entresto  and PCP just increased the dose. He is picking up the new dose of entresto  later today. Says that he's only taking his lasix  as needed and hasn't taken it recently.    Previous cardiac  studies:  Echo 06/26/15 EF 60-65% along with mild LVH and trivial AR.  Echo 04/19/18: EF 50-55% Echo 03/08/20: EF 45-50% along with mild Lawrence Echo 11/02/22: EF 50-55% with mild LVH, moderate LAE  LHC 11/02/22:   Prox LAD to Mid LAD lesion is 30% stenosed.   Mid LAD lesion is 40% stenosed.   Prox RCA lesion is 15% stenosed.   Dist RCA lesion is 25% stenosed.   1st Mrg lesion is 70% stenosed.   RPAV-2 lesion is 60% stenosed.   RPAV-1 lesion is  85% stenosed with 85% stenosed side branch in 1st RPL.   RPDA-1 lesion is 90% stenosed.   RPDA-2 lesion is 85% stenosed.   Non-stenotic Mid LAD to Dist LAD lesion was previously treated.   Non-stenotic Prox Cx to Mid Cx lesion was previously treated.   Non-stenotic Origin to Prox Graft lesion was previously treated.   LIMA and is small.   There is mild left ventricular systolic dysfunction.   LV end diastolic pressure is mildly elevated.   The left ventricular ejection fraction is 45-50% by visual estimate.  Plan LHC from right  groin because of his persistent anginal symptoms.  Chronic angina Left ventriculogram shows mildly dilated LV with apical hypokinesis EF around 45-50 Coronaries Left main mild irregularities LAD mild to moderate disease.  Diagonal 1 Circumflex large minor irregularities occluded OM1 RCA large minor irregularities moderate disease TIMI-3 flow right dominant No significant collaterals  Grafts LIMA to mid LAD atretic SVG to OM1 widely patent  Patient cath essentially improved from previous procedure done 04/2021.  No areas of significant obstructive disease requiring intervention. Recommend aggressive medical therapy for chronic chest pain appears to mostly be noncardiac   ROS: All systems negative except as listed in HPI, PMH and Problem List.  SH:  Social History   Socioeconomic History   Marital status: Widowed    Spouse name: Not on file   Number of children: 1   Years of education: Not on file   Highest education level: 10th grade  Occupational History   Occupation: Disabled   Occupation: on Tree surgeon  Tobacco Use   Smoking status: Former    Current packs/day: 0.00    Average packs/day: 3.0 packs/day for 50.0 years (150.0 ttl pk-yrs)    Types: Cigarettes    Start date: 04/23/1963    Quit date: 04/22/2013    Years since quitting: 11.3   Smokeless tobacco: Never   Tobacco comments:    Reports not smoking for approx 8 years.  Vaping Use    Vaping status: Never Used  Substance and Sexual Activity   Alcohol use: No    Comment: Hx Alcohol Abuse   Drug use: Not Currently    Types: Marijuana   Sexual activity: Not Currently  Other Topics Concern   Not on file  Social History Narrative   Pt lives in [place] with wife.  Does not routinely exercise.   Social Drivers of Corporate investment banker Strain: Low Risk  (06/19/2024)   Received from [facility]   Overall Financial Resource Strain (CARDIA)    How hard is it for you to pay for the very basics like food, housing, medical care, and heating?: Not very hard  Food Insecurity: Food Insecurity Present (08/14/2024)   Hunger Vital Sign    Worried About Running Out of Food in the Last Year: Sometimes true    Ran Out of Food in the Last Year: Never true  Transportation  Needs: No Transportation Needs (08/14/2024)   PRAPARE - Administrator, Civil Service (Medical): No    Lack of Transportation (Non-Medical): No  Physical Activity: Insufficiently Active (03/14/2024)   Exercise Vital Sign    Days of Exercise per Week: 3 days    Minutes of Exercise per Session: 20 min  Stress: No Stress Concern Present (03/14/2024)   [employer] of Occupational Health - Occupational Stress Questionnaire    Feeling of Stress : Only a little  Recent Concern: Stress - Stress Concern Present (01/13/2024)   [employer] of Occupational Health - Occupational Stress Questionnaire    Feeling of Stress : To some extent  Social Connections: Socially Isolated (08/14/2024)   Social Connection and Isolation Panel    Frequency of Communication with Friends and Family: More than three times a week    Frequency of Social Gatherings with Friends and Family: Once a week    Attends Religious Services: Never    Database administrator or Organizations: No    Attends Banker Meetings: Never    Marital Status: Widowed  Intimate Partner Violence: Not At Risk (08/14/2024)    Humiliation, Afraid, Rape, and Kick questionnaire    Fear of Current or Ex-Partner: No    Emotionally Abused: No    Physically Abused: No    Sexually Abused: No    FH:  Family History  Problem Relation Age of Onset   Heart attack Mother    Depression Mother    Heart disease Mother    COPD Mother    Hypertension Mother    Heart attack Father    Diabetes Father    Depression Father    Heart disease Father    Cirrhosis Father    Parkinson's disease Brother     Past Medical History:  Diagnosis Date   A-fib (HCC)    Anemia    Anginal pain    Anxiety    Arthritis    Asthma    Atrial fibrillation, chronic (HCC) 10/19/2023   CAD (coronary artery disease)    a. 2002 CABGx2 (LIMA->LAD, VG->VG->OM1);  b. 09/2012 DES->OM;  c. 03/2015 PTCA of LAD ([facility]) in setting of atretic LIMA; d. 05/2015 Cath [facility]): nonobs dzs; e. 06/2015 Cath ([facility]): LM nl, LAD 45p/d ISR, 50d, D1/2 small, LCX 50p/d ISR, OM1 70ost, 30 ISR, VG->OM1 50ost, 41m, LIMA->LAD 99p/d - atretic, RCA dom, nl; f.cath 10/16: 40-50%(FFR 0.90) pLAD, 75% (FFR 0.77) mLAD s/p PCI/DES, oRCA 40% (FFR0.95)   Cancer (HCC)    SKIN CANCER ON BACK   Celiac disease    Chronic diastolic CHF (congestive heart failure) (HCC)    a. 06/2009 Echo: EF 60-65%, Gr 1 DD, triv AI, mildly dil LA, nl RV.   COPD (chronic obstructive pulmonary disease) (HCC)    a. Chronic bronchitis and emphysema.   DDD (degenerative disc disease), lumbar    Diverticulosis    Dysrhythmia    Essential hypertension    GERD (gastroesophageal reflux disease)    History of hiatal hernia    History of kidney stones    H/O   History of tobacco abuse    a. Quit 2014.   Myocardial infarction [facility]) 2002   4 STENTS   Pancreatitis    PSVT (paroxysmal supraventricular tachycardia)    a. 10/2012 Noted on Zio Patch.   RLL pneumonia 05/30/2024   Sleep apnea    LOST CORD TO CPAP -ONLY 02 @ BEDTIME   Tubular adenoma of colon  Type II diabetes mellitus (HCC)    There were no  vitals filed for this visit.  Wt Readings from Last 3 Encounters:  08/16/24 177 lb (80.3 kg)  08/13/24 183 lb (83 kg)  08/11/24 178 lb 1.6 oz (80.8 kg)   Lab Results  Component Value Date   CREATININE 1.10 08/16/2024   CREATININE 0.99 08/15/2024   CREATININE 1.08 08/14/2024    PHYSICAL EXAM:  General: Disheveled appearing.  Cor: No JVD. Irregular rhythm, rate.  Lungs: expiratory wheezing throughout all lung fields Abdomen: soft, nontender, nondistended. Extremities: 1+ pitting edema bilateral lower legs Neuro:. Affect pleasant   ECG: not done   ASSESSMENT & PLAN:  1: Chronic ischemic heart failure with mildly reduced ejection fraction- - CABG in addition to PCI/DES to mid LAD with subsequent PTCA (2016), proximal to mid LAD stents (2014), distal LAD stent (unknown date), stent in the ostium SVG (unknown date) & circumflex/OM 1 bifucation stent (2013).  - NYHA class III - euvolemic - weight up 4 pounds from last visit here 3 weeks ago - Echo 06/26/15 EF 60-65% along with mild LVH and trivial AR.  - Echo 04/19/18: EF 50-55% - Echo 03/08/20: EF 45-50% along with mild Lawrence - Echo 11/02/22: EF 50-55% with mild LVH, moderate LAE - Echo 05/01/23: EF 50-55% with mild LVH and mild/ moderate LAE - Echo 06/10/24: EF 45-50%, mild LVH, G1DD - continue farxiga  10mg  daily - begin furosemide  20mg  daily, was taking it PRN - continue metoprolol  succinate 25mg  daily - begin entresto  49/51mg  BID (PCP already sent in new RX) - begin spironolactone  12.5mg  daily. BMET next week - jardiance  caused perineal pain - proBNP 07/27/24 was 2063.0  2: HTN- - BP  - saw PCP Jeralyn) 08/25 - BMP 07/27/24 reviewed: sodium 144, potassium 4.7, creatinine 0.84 & GFR >90  3: CAD- - saw [facility] cardiology 06/25 - continue isosorbide  MN 120mg  daily - continue plavix  75mg  daily - continue ranexa  1000mg  BID - LHC 11/02/22:   Prox LAD to Mid LAD lesion is 30% stenosed.   Mid LAD lesion is 40% stenosed.   Prox RCA  lesion is 15% stenosed.   Dist RCA lesion is 25% stenosed.   1st Mrg lesion is 70% stenosed.   RPAV-2 lesion is 60% stenosed.   RPAV-1 lesion is 85% stenosed with 85% stenosed side branch in 1st RPL.   RPDA-1 lesion is 90% stenosed.   RPDA-2 lesion is 85% stenosed.   Non-stenotic Mid LAD to Dist LAD lesion was previously treated.   Non-stenotic Prox Cx to Mid Cx lesion was previously treated.   Non-stenotic Origin to Prox Graft lesion was previously treated.   LIMA and is small.   There is mild left ventricular systolic dysfunction.   LV end diastolic pressure is mildly elevated.   The left ventricular ejection fraction is 45-50% by visual estimate.   4: Atrial fibrillation- - continue eliquis  5mg  BID - continue amiodarone  200mg  daily - irregular rhythm. EF too high for AICD.   5: DM- - A1c 02/01/24 was 5.8% - continue metformin   6: Hyperlipidemia- - LDL 10/19/23 was 80 with triglycerides of 156 - continue atorvastatin  80mg  daily   Return in 1 month, sooner if needed.   Ellouise Class, FNP-C 07/28/24

## 2024-08-25 ENCOUNTER — Encounter: Admitting: Family

## 2024-08-28 ENCOUNTER — Ambulatory Visit

## 2024-08-31 ENCOUNTER — Telehealth: Payer: Self-pay

## 2024-09-01 ENCOUNTER — Telehealth: Payer: Self-pay | Admitting: Acute Care

## 2024-09-01 DIAGNOSIS — Z122 Encounter for screening for malignant neoplasm of respiratory organs: Secondary | ICD-10-CM

## 2024-09-01 DIAGNOSIS — Z87891 Personal history of nicotine dependence: Secondary | ICD-10-CM

## 2024-09-01 NOTE — Telephone Encounter (Signed)
 Lung Cancer Screening Narrative/Criteria Questionnaire (Cigarette Smokers Only- No Cigars/Pipes/vapes)   ARGEL PABLO   SDMV:09/07/2024 11:00 Natalie       [birth year]   LDCT: 09/12/2024 1:30p  [facility]    68 y.o.   Phone: [phone]  Lung Screening Narrative (confirm age 19-77 yrs Medicare / 50-80 yrs Private pay insurance)   Insurance information:[employer] mcr and mcd   Referring Provider:Dr. Jens   This screening involves an initial phone call with a team member from our program. It is called a shared decision making visit. The initial meeting is required by  insurance and Medicare to make sure you understand the program. This appointment takes about 15-20 minutes to complete. You will complete the screening scan at your scheduled date/time.  This scan takes about 5-10 minutes to complete. You can eat and drink normally before and after the scan.  Criteria questions for Lung Cancer Screening:   Are you a current or former smoker? Former Age began smoking: 68yo   If you are a former smoker, what year did you quit smoking? 2014(within 15 yrs)   To calculate your smoking history, I need an accurate estimate of how many packs of cigarettes you smoked per day and for how many years. (Not just the number of PPD you are now smoking)   Years smoking 51 x Packs per day 2 = Pack years 102   (at least 20 pack yrs)   (Make sure they understand that we need to know how much they have smoked in the past, not just the number of PPD they are smoking now)  Do you have a personal history of cancer?  No    Do you have a family history of cancer? No  Are you coughing up blood?  No  Have you had unexplained weight loss of 15 lbs or more in the last 6 months? No  It looks like you meet all criteria.  When would be a good time for us  to schedule you for this screening?   Additional information: N/A

## 2024-09-05 ENCOUNTER — Ambulatory Visit: Admission: RE | Admit: 2024-09-05 | Source: Ambulatory Visit

## 2024-09-07 ENCOUNTER — Ambulatory Visit (INDEPENDENT_AMBULATORY_CARE_PROVIDER_SITE_OTHER): Admitting: *Deleted

## 2024-09-07 ENCOUNTER — Encounter: Payer: Self-pay | Admitting: *Deleted

## 2024-09-07 DIAGNOSIS — Z125 Encounter for screening for malignant neoplasm of prostate: Secondary | ICD-10-CM

## 2024-09-07 DIAGNOSIS — Z87891 Personal history of nicotine dependence: Secondary | ICD-10-CM

## 2024-09-07 NOTE — Patient Instructions (Signed)

## 2024-09-07 NOTE — Progress Notes (Signed)
 Virtual Visit via Telephone Note  I connected with Lawrence [name] on 09/07/24 at 11:00 AM EDT by telephone and verified that I am speaking with the correct person using two identifiers.  Location: Patient: at home Provider: [address], [place], [place], [address]    I discussed the limitations, risks, security and privacy concerns of performing an evaluation and management service by telephone and the availability of in person appointments. I also discussed with the patient that there may be a patient responsible charge related to this service. The patient expressed understanding and agreed to proceed.   Shared Decision Making Visit Lung Cancer Screening Program [phone])   Eligibility: Age 68 y.o. Pack Years Smoking History Calculation 102 (# packs/per year x # years smoked) Recent History of coughing up blood  no Unexplained weight loss? no ( >Than 15 pounds within the last 6 months ) Prior History Lung / other cancer no (Diagnosis within the last 5 years already requiring surveillance chest CT Scans). Smoking Status Former Smoker Former Smokers: Years since quit: 11 years  Quit Date: 2014  Visit Components: Discussion included one or more decision making aids. yes Discussion included risk/benefits of screening. yes Discussion included potential follow up diagnostic testing for abnormal scans. yes Discussion included meaning and risk of over diagnosis. yes Discussion included meaning and risk of False Positives. yes Discussion included meaning of total radiation exposure. yes  Counseling Included: Importance of adherence to annual lung cancer LDCT screening. yes Impact of comorbidities on ability to participate in the program. yes Ability and willingness to under diagnostic treatment. yes  Smoking Cessation Counseling: Current Smokers:  Discussed importance of smoking cessation. yes Information about tobacco cessation classes and interventions provided to  patient. yes Patient provided with ticket for LDCT Scan. no Symptomatic Patient. no   Diagnosis Code: Tobacco Use Z72.0 Asymptomatic Patient yes   Counseled patient 4 minutes regarding tobacco use.   Former Smokers:  Discussed the importance of maintaining cigarette abstinence. yes Diagnosis Code: Personal History of Nicotine Dependence. S12.108 Information about tobacco cessation classes and interventions provided to patient. Yes Patient provided with ticket for LDCT Scan. no Written Order for Lung Cancer Screening with LDCT placed in Epic. Yes (CT Chest Lung Cancer Screening Low Dose W/O CM) [id number] Z12.2-Screening of respiratory organs Z87.891-Personal history of nicotine dependence   Laneta Speaks, RN

## 2024-09-08 ENCOUNTER — Other Ambulatory Visit: Payer: Self-pay | Admitting: Family Medicine

## 2024-09-08 DIAGNOSIS — G4700 Insomnia, unspecified: Secondary | ICD-10-CM

## 2024-09-09 ENCOUNTER — Emergency Department

## 2024-09-09 ENCOUNTER — Other Ambulatory Visit: Payer: Self-pay

## 2024-09-09 ENCOUNTER — Emergency Department
Admission: EM | Admit: 2024-09-09 | Discharge: 2024-09-09 | Disposition: A | Discharge status: Home / Self Care | Attending: Emergency Medicine | Admitting: Emergency Medicine

## 2024-09-09 DIAGNOSIS — I509 Heart failure, unspecified: Secondary | ICD-10-CM | POA: Diagnosis not present

## 2024-09-09 DIAGNOSIS — I2510 Atherosclerotic heart disease of native coronary artery without angina pectoris: Secondary | ICD-10-CM | POA: Insufficient documentation

## 2024-09-09 DIAGNOSIS — M25532 Pain in left wrist: Secondary | ICD-10-CM | POA: Diagnosis not present

## 2024-09-09 DIAGNOSIS — I4891 Unspecified atrial fibrillation: Secondary | ICD-10-CM | POA: Insufficient documentation

## 2024-09-09 DIAGNOSIS — W01198A Fall on same level from slipping, tripping and stumbling with subsequent striking against other object, initial encounter: Secondary | ICD-10-CM | POA: Insufficient documentation

## 2024-09-09 DIAGNOSIS — Z7901 Long term (current) use of anticoagulants: Secondary | ICD-10-CM | POA: Diagnosis not present

## 2024-09-09 DIAGNOSIS — R1031 Right lower quadrant pain: Secondary | ICD-10-CM | POA: Diagnosis not present

## 2024-09-09 DIAGNOSIS — R0789 Other chest pain: Secondary | ICD-10-CM | POA: Insufficient documentation

## 2024-09-09 DIAGNOSIS — R110 Nausea: Secondary | ICD-10-CM | POA: Diagnosis not present

## 2024-09-09 DIAGNOSIS — M545 Low back pain, unspecified: Secondary | ICD-10-CM | POA: Diagnosis not present

## 2024-09-09 DIAGNOSIS — J449 Chronic obstructive pulmonary disease, unspecified: Secondary | ICD-10-CM | POA: Insufficient documentation

## 2024-09-09 DIAGNOSIS — R197 Diarrhea, unspecified: Secondary | ICD-10-CM | POA: Insufficient documentation

## 2024-09-09 DIAGNOSIS — R42 Dizziness and giddiness: Secondary | ICD-10-CM | POA: Insufficient documentation

## 2024-09-09 DIAGNOSIS — W19XXXA Unspecified fall, initial encounter: Secondary | ICD-10-CM

## 2024-09-09 LAB — URINALYSIS, W/ REFLEX TO CULTURE (INFECTION SUSPECTED)
Bacteria, UA: NONE SEEN
Bilirubin Urine: NEGATIVE
Glucose, UA: >=500 mg/dL — AB
Hgb urine dipstick: NEGATIVE
Ketones, ur: NEGATIVE mg/dL
Leukocytes,Ua: NEGATIVE
Nitrite: NEGATIVE
Protein, ur: NEGATIVE mg/dL
Specific Gravity, Urine: 1.033 — ABNORMAL HIGH (ref 1.005–1.030)
Squamous Epithelial / HPF: 0 /HPF (ref 0–5)
pH: 6 (ref 5.0–8.0)

## 2024-09-09 LAB — CBC
HCT: 50.7 % (ref 39.0–52.0)
Hemoglobin: 16.7 g/dL (ref 13.0–17.0)
MCH: 29.7 pg (ref 26.0–34.0)
MCHC: 32.9 g/dL (ref 30.0–36.0)
MCV: 90.2 fL (ref 80.0–100.0)
Platelets: 189 K/uL (ref 150–400)
RBC: 5.62 MIL/uL (ref 4.22–5.81)
RDW: 14.2 % (ref 11.5–15.5)
WBC: 5.9 K/uL (ref 4.0–10.5)
nRBC: 0 % (ref 0.0–0.2)

## 2024-09-09 LAB — BASIC METABOLIC PANEL WITH GFR
Anion gap: 7 (ref 5–15)
BUN: 17 mg/dL (ref 8–23)
CO2: 27 mmol/L (ref 22–32)
Calcium: 9.2 mg/dL (ref 8.9–10.3)
Chloride: 103 mmol/L (ref 98–111)
Creatinine, Ser: 1.09 mg/dL (ref 0.61–1.24)
GFR, Estimated: >60 mL/min (ref >60)
Glucose, Bld: 145 mg/dL — ABNORMAL HIGH (ref 70–99)
Potassium: 4 mmol/L (ref 3.5–5.1)
Sodium: 137 mmol/L (ref 135–145)

## 2024-09-09 LAB — TROPONIN I (HIGH SENSITIVITY)
Troponin I (High Sensitivity): 14 ng/L (ref <18)
Troponin I (High Sensitivity): 14 ng/L (ref <18)

## 2024-09-09 LAB — PROTIME-INR
INR: 1.1 (ref 0.8–1.2)
Prothrombin Time: 14.3 s (ref 11.4–15.2)

## 2024-09-09 MED ORDER — SODIUM CHLORIDE 0.9% FLUSH: 3.0000 mL | Freq: Once | INTRAVENOUS | Status: DC

## 2024-09-09 MED ORDER — IOHEXOL 300 MG/ML  SOLN
100.0000 mL | Freq: Once | INTRAMUSCULAR | Status: AC | PRN
  Administered 2024-09-09: 100 mL via INTRAVENOUS

## 2024-09-09 MED ORDER — ACETAMINOPHEN 500 MG PO TABS
1000.0000 mg | ORAL_TABLET | Freq: Once | ORAL | Status: AC
  Administered 2024-09-09: 1000 mg via ORAL
  Filled 2024-09-09: qty 2

## 2024-09-09 MED ORDER — SODIUM CHLORIDE 0.9 % IV BOLUS
500.0000 mL | Freq: Once | INTRAVENOUS | Status: AC
  Administered 2024-09-09: 500 mL via INTRAVENOUS

## 2024-09-09 MED ORDER — LIDOCAINE 5 % EX PTCH
1.0000 | MEDICATED_PATCH | CUTANEOUS | Status: DC
  Administered 2024-09-09: 1 via TRANSDERMAL
  Filled 2024-09-09: qty 1

## 2024-09-09 NOTE — ED Provider Notes (Signed)
 [name] Provider Note    Event Date/Time   First MD Initiated Contact with Patient 09/09/24 2044     (approximate)   History   Fall and Near Syncope   HPI  Lawrence [name] is a 68 y.o. male with history of hyperlipidemia, BPH, A-fib on Eliquis , history of COPD, CHF, presenting with lightheadedness.  Clemens and hit his head last night.  States that he fell again today.  Was complaining about left arm and lower back pain.  Left rib pain.  Says that he felt lightheaded prior to falling.  Lives at home with grandson who is his caregiver.  States that has been having some diarrhea and nausea.  No vomiting, no urinary symptoms.  No fever or cough.  On independent chart review, patient was admitted in October for chest pain, he does have history of CAD status post stenting.  At that time he was referred to palliative care for help with chronic pain, cardiology at the time was consulted and patient was deemed not a candidate for invasive interventions for ischemia.  They continued his home meds for anginal pain.     Physical Exam   Triage Vital Signs: ED Triage Vitals  Encounter Vitals Group     BP 09/09/24 1931 (!) 146/89     Girls Systolic BP Percentile --      Girls Diastolic BP Percentile --      Boys Systolic BP Percentile --      Boys Diastolic BP Percentile --      Pulse Rate 09/09/24 1931 84     Resp 09/09/24 1931 18     Temp 09/09/24 1931 97.6 F (36.4 C)     Temp Source 09/09/24 1931 Oral     SpO2 09/09/24 1930 97 %     Weight 09/09/24 1932 177 lb (80.3 kg)     Height 09/09/24 1932 5' 7 (1.702 m)     Head Circumference --      Peak Flow --      Pain Score 09/09/24 1931 8     Pain Loc --      Pain Education --      Exclude from Growth Chart --     Most recent vital signs: Vitals:   09/09/24 1930 09/09/24 1931  BP:  (!) 146/89  Pulse:  84  Resp:  18  Temp:  97.6 F (36.4 C)  SpO2: 97% 97%     General: Awake, no distress.   CV:  Good peripheral perfusion.  Resp:  Normal effort.  Left lateral thoracic cage tenderness Abd:  No distention.  Soft, mildly tender to the right lower quadrant without guarding Other:  Full range of motion of bilateral lower extremities intact without focal tenderness, able to fully range his bilateral upper extremity but he has tenderness to the left wrist.  Grip strength sensation is intact.  No palpable skull deformities or tenderness, no facial deformities or tenderness, no midline spinal tenderness.   ED Results / Procedures / Treatments   Labs (all labs ordered are listed, but only abnormal results are displayed) Labs Reviewed  BASIC METABOLIC PANEL WITH GFR - Abnormal; Notable for the following components:      Result Value   Glucose, Bld 145 (*)    All other components within normal limits  URINALYSIS, W/ REFLEX TO CULTURE (INFECTION SUSPECTED) - Abnormal; Notable for the following components:   Color, Urine YELLOW (*)    APPearance CLEAR (*)  Specific Gravity, Urine 1.033 (*)    Glucose, UA >=500 (*)    All other components within normal limits  CBC  PROTIME-INR  TROPONIN I (HIGH SENSITIVITY)  TROPONIN I (HIGH SENSITIVITY)     EKG  EKG shows, atrial fibrillation, rate 93, normal QS, normal QTc, no obvious ischemic ST elevation, T wave flattening to 1, aVL, T wave version to V5, V6, T wave changes new compared to prior   RADIOLOGY On my independent interpretation, CT without obvious intracranial hemorrhage   PROCEDURES:  Critical Care performed: No  Procedures   MEDICATIONS ORDERED IN ED: Medications  sodium chloride  flush (NS) 0.9 % injection 3 mL (3 mLs Intravenous Not Given 09/09/24 2019)  lidocaine  (LIDODERM ) 5 % 1 patch (1 patch Transdermal Patch Applied 09/09/24 2126)  acetaminophen  (TYLENOL ) tablet 1,000 mg (1,000 mg Oral Given 09/09/24 2125)  sodium chloride  0.9 % bolus 500 mL (0 mLs Intravenous Stopped 09/09/24 2215)  iohexol  (OMNIPAQUE ) 300  MG/ML solution 100 mL (100 mLs Intravenous Contrast Given 09/09/24 2144)     IMPRESSION / MDM / ASSESSMENT AND PLAN / ED COURSE  I reviewed the triage vital signs and the nursing notes.                              Differential diagnosis includes, but is not limited to, viral illness, gastroenteritis, colitis, diverticulitis, appendicitis, UTI, musculoskeletal strain, sprain, costochondritis, intracranial hemorrhage, fracture, also considered arrhythmia, atypical ACS.  Will get labs, EKG, troponin, chest x-ray, x-ray wrist, CT head, cervical spine, CT abdomen pelvis.  Will given some fluids here.  Will given some Tylenol  and Lidoderm  patch.  Patient's presentation is most consistent with acute presentation with potential threat to life or bodily function.  Independent interpretation of labs and imaging below.  On reassessment after fluids, patient is feeling significantly better.  Asymptomatic at this time.  Suspect he might have been mildly dehydrated.  Labs are reassuring, imaging without obvious infection or traumatic injury.  Discussed with him about outpatient follow-up with his primary care doctor next week to get reassessed.  Otherwise considered but no indication for inpatient admission at this time, he safe for outpatient management.  Will discharge with strict precautions.  Shared decision making done with patient and he is agreeable with this plan.  The patient is on the cardiac monitor to evaluate for evidence of arrhythmia and/or significant heart rate changes.   Clinical Course as of 09/09/24 2244  Sat Sep 09, 2024  2050 CT Cervical Spine Wo Contrast 1. No acute abnormality of the cervical spine.  [TT]  2050 DG Chest 2 View 1. No acute cardiopulmonary process.  [TT]  2050 CT Head Wo Contrast 1. No acute intracranial abnormality.  [TT]  2137 DG Wrist Complete Left 1. No significant abnormality.  [TT]  2137 CT Cervical Spine Wo Contrast 1. No acute abnormality of the  cervical spine.  [TT]  2137 DG Chest 2 View 1. No acute cardiopulmonary process.  [TT]  2137 CT Head Wo Contrast 1. No acute intracranial abnormality.  [TT]  2230 CT ABDOMEN PELVIS W CONTRAST IMPRESSION: 1. No acute findings in the abdomen or pelvis. 2. Normal appendix.   [TT]  2233 Urinalysis, w/ Reflex to Culture (Infection Suspected) -Urine, Clean Catch(!) Not consistent with UTI. [TT]  2242 Troponin I (High Sensitivity) [TT]  2243 Troponin I (High Sensitivity) Not elevated. [TT]    Clinical Course User Index [TT] [name],  MD     FINAL CLINICAL IMPRESSION(S) / ED DIAGNOSES   Final diagnoses:  Fall, initial encounter  Rib pain  Left wrist pain  Lightheadedness  Diarrhea, unspecified type  Nausea  Right lower quadrant abdominal pain     Rx / DC Orders   ED Discharge Orders     None        Note:  This document was prepared using Dragon voice recognition software and may include unintentional dictation errors.    [name], MD 09/09/24 [phone]

## 2024-09-09 NOTE — Discharge Instructions (Signed)
 Please make sure to keep her self hydrated.  Please follow-up with your primary care doctor next week to get reassessed.

## 2024-09-09 NOTE — ED Triage Notes (Signed)
 Patient reports getting light headed last night and fell, and then fell again today. Reports pain in left arm and lower back.    Patient reports taking Eliquis  at time. Denies hitting head & denies LOC.   Patient reports left sided chest pain since falling today.

## 2024-09-12 ENCOUNTER — Ambulatory Visit
Admission: RE | Admit: 2024-09-12 | Discharge: 2024-09-12 | Disposition: A | Discharge status: Home / Self Care | Source: Ambulatory Visit | Attending: Acute Care | Admitting: Acute Care

## 2024-09-12 DIAGNOSIS — Z87891 Personal history of nicotine dependence: Secondary | ICD-10-CM | POA: Diagnosis present

## 2024-09-12 DIAGNOSIS — Z122 Encounter for screening for malignant neoplasm of respiratory organs: Secondary | ICD-10-CM | POA: Diagnosis present

## 2024-09-13 ENCOUNTER — Ambulatory Visit: Admitting: Family Medicine

## 2024-09-15 ENCOUNTER — Other Ambulatory Visit: Payer: Self-pay

## 2024-09-15 ENCOUNTER — Other Ambulatory Visit: Payer: Self-pay | Admitting: Family Medicine

## 2024-09-15 NOTE — Patient Outreach (Signed)
 Complex Care Management   Visit Note  09/15/2024  Name:  Lawrence [name] MRN: [id number] DOB: 17-Jul-1956  Situation: Referral received for Complex Care Management related to Heart Failure and COPD I obtained verbal consent from Patient.  Visit completed with Lawrence [name] and his caregiver/grandson Lawrence [name] via telephone.  Background:   Past Medical History:  Diagnosis Date   A-fib (HCC)    Anemia    Anginal pain    Anxiety    Arthritis    Asthma    Atrial fibrillation, chronic (HCC) 10/19/2023   CAD (coronary artery disease)    a. 2002 CABGx2 (LIMA->LAD, VG->VG->OM1);  b. 09/2012 DES->OM;  c. 03/2015 PTCA of LAD [facility]) in setting of atretic LIMA; d. 05/2015 Cath [facility]): nonobs dzs; e. 06/2015 Cath ([facility]): LM nl, LAD 45p/d ISR, 50d, D1/2 small, LCX 50p/d ISR, OM1 70ost, 30 ISR, VG->OM1 50ost, 59m, LIMA->LAD 99p/d - atretic, RCA dom, nl; f.cath 10/16: 40-50%(FFR 0.90) pLAD, 75% (FFR 0.77) mLAD s/p PCI/DES, oRCA 40% (FFR0.95)   Cancer (HCC)    SKIN CANCER ON BACK   Celiac disease    Chronic diastolic CHF (congestive heart failure) (HCC)    a. 06/2009 Echo: EF 60-65%, Gr 1 DD, triv AI, mildly dil LA, nl RV.   COPD (chronic obstructive pulmonary disease) (HCC)    a. Chronic bronchitis and emphysema.   DDD (degenerative disc disease), lumbar    Diverticulosis    Dysrhythmia    Essential hypertension    GERD (gastroesophageal reflux disease)    History of hiatal hernia    History of kidney stones    H/O   History of tobacco abuse    a. Quit 2014.   Myocardial infarction [facility]) 2002   4 STENTS   Pancreatitis    PSVT (paroxysmal supraventricular tachycardia)    a. 10/2012 Noted on Zio Patch.   RLL pneumonia 05/30/2024   Sleep apnea    LOST CORD TO CPAP -ONLY 02 @ BEDTIME   Tubular adenoma of colon    Type II diabetes mellitus (HCC)       Assessment: Patient Reported Symptoms: Cognitive Cognitive Status: Alert and oriented to person, place, and time, Normal speech and language  skills Cognitive/Intellectual Conditions Management [RPT]: Other Other: Reports episodes of short term memory decline. Pending follow up with Neurology on October 09, 2024 Health Maintenance Behaviors: Annual physical exam Healing Pattern: Unsure Health Facilitated by: Stress management, Rest  Neurological Neurological Review of Symptoms: Weakness Oher Neurological Symptoms/Conditions [RPT]: Experiences generalized weakness which is a chronic condition Neurological Management Strategies: Medical device, Routine screening, Coping strategies, Adequate rest Neurological Self-Management Outcome: 3 (uncertain)  HEENT HEENT Symptoms Reported: No symptoms reported HEENT Management Strategies: Routine screening HEENT Self-Management Outcome: 4 (good)  Cardiovascular Cardiovascular Symptoms Reported: No symptoms reported Other Cardiovascular Symptoms: Reports improvement in symptoms over the past few weeks. Reports only requiring use of nitroglycerin  once. He was evaluated in the Emergency Department on September 09, 2024 which was not cardiac related and due to a fall. Reports doing well today. Does patient have uncontrolled Hypertension?: Yes Is patient checking Blood Pressure at home?:  (Reports not recently monitoring but has BP monitor at home.) Do You Have a Working Readable Scale?: Yes Weight: 178 lb (80.7 kg) Cardiovascular Self-Management Outcome: 3 (uncertain)  Respiratory Respiratory Symptoms Reported: Other: Other Respiratory Symptoms: Experiences shortness of breath with minimal exertion which is his baseline. Reports symptoms resolve quickly with rest. Reports using inhalers as advised. Does not require supplemental oxygen  during  the day. Respiratory Management Strategies: Routine screening, Medication therapy, Coping strategies, Breathing techniques, Adequate rest Respiratory Self-Management Outcome: 4 (good)  Endocrine Endocrine Symptoms Reported: No symptoms reported Is patient  diabetic?: Yes Is patient checking blood sugars at home?: No  Gastrointestinal Gastrointestinal Symptoms Reported: No symptoms reported Gastrointestinal Self-Management Outcome: 4 (good)  Genitourinary Genitourinary Symptoms Reported: Incontinence Additional Genitourinary Details: Uses incontinence garments Genitourinary Self-Management Outcome: 4 (good)  Integumentary Integumentary Symptoms Reported: No symptoms reported Skin Management Strategies: Routine screening Skin Self-Management Outcome: 4 (good)  Musculoskeletal Musculoskelatal Symptoms Reviewed: Unsteady gait, Difficulty walking, Weakness Additional Musculoskeletal Details: Requires evalaluation in the Emergency Room on September 09, 2024 due to a fall. Denies falls since that incident. Reports using walker in his apartment as advised and seeking assistance from his grandson when ambulating. Musculoskeletal Management Strategies: Medical device, Routine screening, Coping strategies Falls in the past year?: Yes Number of falls in past year: 2 or more Was there an injury with Fall?: Yes Fall Risk Category Calculator: 3 Patient Fall Risk Level: High Fall Risk Patient at Risk for Falls Due to: History of fall(s), Impaired balance/gait, Impaired mobility, Medication side effect Fall risk Follow up: Falls prevention discussed  Psychosocial Psychosocial Symptoms Reported: No symptoms reported Quality of Family Relationships: supportive, involved Do you feel physically threatened by others?: No    09/18/2024    PHQ2-9 Depression Screening   Little interest or pleasure in doing things Not at all  Feeling down, depressed, or hopeless Not at all  PHQ-2 - Total Score 0    There were no vitals filed for this visit. Outpatient Encounter Medications as of 09/15/2024  Medication Sig Note   albuterol  (PROVENTIL ) (2.5 MG/3ML) 0.083% nebulizer solution Take 3 mLs (2.5 mg total) by nebulization every 6 (six) hours as needed for wheezing or  shortness of breath.    nitroGLYCERIN  (NITROSTAT ) 0.4 MG SL tablet Place 1 tablet (0.4 mg total) under the tongue every 5 (five) minutes as needed for chest pain (Do not take if blood pressure is low, systolic BP<110). 03/21/2024: Needs new order   Accu-Chek Softclix Lancets lancets Use as instructed to check sugar daily for type 2 diabetes.    acetaminophen  (TYLENOL ) 325 MG tablet Take 2 tablets (650 mg total) by mouth every 6 (six) hours as needed for mild pain (pain score 1-3) or fever (or Fever >/= 101).    allopurinol  (ZYLOPRIM ) 300 MG tablet TAKE 1 TABLET BY MOUTH TWICE A DAY    apixaban  (ELIQUIS ) 5 MG TABS tablet Take 1 tablet (5 mg total) by mouth 2 (two) times daily.    atorvastatin  (LIPITOR ) 80 MG tablet TAKE 1 TABLET BY MOUTH AT BEDTIME    Blood Glucose Monitoring Suppl (ACCU-CHEK GUIDE) w/Device KIT Use to check blood sugars as directed    Blood Pressure Monitor DEVI Use to check blood pressure daily    BREZTRI  AEROSPHERE 160-9-4.8 MCG/ACT AERO inhaler INHALE 2 PUFFS BY MOUTH INTO THE LUNGS 2TIMES DAILY    cetirizine  (ZYRTEC ) 10 MG tablet Take 1 tablet (10 mg total) by mouth at bedtime.    cholecalciferol  (VITAMIN D3) 25 MCG (1000 UNIT) tablet Take 1,000 Units by mouth daily.    Cyanocobalamin  (VITAMIN B-12) 1000 MCG SUBL Place 1 tablet under the tongue daily.    dapagliflozin  propanediol (FARXIGA ) 10 MG TABS tablet TAKE 1 TABLET BY MOUTH DAILY    famotidine  (PEPCID ) 20 MG tablet TAKE 1 TABLET BY MOUTH TWICE A DAY    glucose blood (ACCU-CHEK GUIDE) test strip  Use as instructed to check sugar daily for type 2 diabetes.    iron  polysaccharides (FERREX 150) 150 MG capsule Take one capsul every Monday, Wednesday, and Friday    isosorbide  mononitrate (IMDUR ) 120 MG 24 hr tablet Take 1 tablet (120 mg total) by mouth daily. (Patient not taking: Reported on 08/14/2024) 06/10/2024: Pt taking 60 mg   lithium  carbonate 150 MG capsule Take 150 mg by mouth 2 (two) times daily. (Patient not taking:  Reported on 08/14/2024)    metFORMIN  (GLUCOPHAGE -XR) 500 MG 24 hr tablet TAKE 1 TABLET BY MOUTH DAILY WITH EVENING MEAL    methocarbamol  (ROBAXIN ) 500 MG tablet TAKE 1 TABLET BY MOUTH EVERY 8 HOURS AS NEEDED FOR MUSCLE SPASMS    metoprolol  succinate (TOPROL -XL) 25 MG 24 hr tablet TAKE 1 TABLET BY MOUTH DAILY    omega-3 acid ethyl esters (LOVAZA ) 1 g capsule TAKE 4 CAPSULES (4 GRAMS TOTAL) BY MOUTHDAILY    ondansetron  (ZOFRAN ) 4 MG tablet Take 1 tablet (4 mg total) by mouth every 8 (eight) hours as needed for nausea or vomiting. (Patient not taking: Reported on 09/15/2024)    OXYGEN  Inhale 2 L into the lungs at bedtime as needed (for shortness of breath).    pantoprazole  (PROTONIX ) 40 MG tablet TAKE 1 TABLET BY MOUTH 2 TIMES DAILY (30MINUTES BEFORE A MEAL)    ranolazine  (RANEXA ) 1000 MG SR tablet Take 1 tablet (1,000 mg total) by mouth 2 (two) times daily.    sacubitril -valsartan  (ENTRESTO ) 49-51 MG Take 1 tablet by mouth 2 (two) times daily.    spironolactone  (ALDACTONE ) 25 MG tablet Take 1 tablet (25 mg total) by mouth daily.    traZODone  (DESYREL ) 150 MG tablet TAKE 1 TABLET BY MOUTH AT BEDTIME AS NEEDED FOR SLEEP.    [DISCONTINUED] ALPRAZolam  (XANAX ) 1 MG tablet TAKE 1 TABLET BY MOUTH 3 TIMES A DAY AS NEEDED    [DISCONTINUED] oxyCODONE -acetaminophen  (PERCOCET) 10-325 MG tablet TAKE 1 TABLET BY MOUTH EVERY 4 HOURS AS NEEDED FOR PAIN    No facility-administered encounter medications on file as of 09/15/2024.      Recommendation:   Continue Current Plan of Care Schedule follow up with PCP Complete Neurology outreach on October 09, 2024  Follow Up Plan:   Telephone follow up appointment with Nurse Case Manager on October 13, 2024   Jackson Acron [facility] RN Care Manager Direct Dial: [phone]  Fax: [phone] Website: [email or web address]

## 2024-09-18 ENCOUNTER — Other Ambulatory Visit: Payer: Self-pay

## 2024-09-18 DIAGNOSIS — Z122 Encounter for screening for malignant neoplasm of respiratory organs: Secondary | ICD-10-CM

## 2024-09-18 DIAGNOSIS — Z87891 Personal history of nicotine dependence: Secondary | ICD-10-CM

## 2024-09-18 NOTE — Patient Instructions (Signed)
 Thank you for allowing the Complex Care Management team to participate in your care. It was great speaking with you!  We will follow up on October 13, 2024 at 1 pm. Please do not hesitate to contact me if you require assistance prior to our next outreach.   Jackson Acron [facility] Population Health RN Care Manager Direct Dial: [phone]  Fax: [phone] Website: [email or web address]

## 2024-09-20 ENCOUNTER — Telehealth: Payer: Self-pay

## 2024-09-20 NOTE — Telephone Encounter (Signed)
 Called and left message for Leita with [facility] about PT.  I spoke with patient and gave him Dr. Lyle message.

## 2024-09-20 NOTE — Telephone Encounter (Signed)
 Copied from CRM [id number]. Topic: Clinical - Home Health Verbal Orders >> Sep 20, 2024  9:40 AM Rosaria BRAVO wrote: Leita from [facility] called to report that the patient has been seen recently at the ED for falls/chest pain.   Leita wants to know if he can receive home health PT orders  She also has questions about his furosemide , the patient reports that the ED provider told the patient to stop taking this.   Please advise   Leita Paterson contact: [phone] ext [phone]  She wants someone to contact the patient about medication clarification

## 2024-09-20 NOTE — Telephone Encounter (Signed)
 Ok from home PT evaluate and treat. They thought he might be a little dehydrated at the ER, but he needs to take the furosemide  due to CHF. Just encourage him to drink more water.

## 2024-10-03 ENCOUNTER — Emergency Department
Admission: EM | Admit: 2024-10-03 | Discharge: 2024-10-03 | Disposition: A | Discharge status: Home / Self Care | Source: Home / Self Care | Attending: Emergency Medicine | Admitting: Emergency Medicine

## 2024-10-03 ENCOUNTER — Other Ambulatory Visit: Payer: Self-pay

## 2024-10-03 ENCOUNTER — Encounter: Payer: Self-pay | Admitting: Emergency Medicine

## 2024-10-03 DIAGNOSIS — I509 Heart failure, unspecified: Secondary | ICD-10-CM | POA: Insufficient documentation

## 2024-10-03 DIAGNOSIS — I2510 Atherosclerotic heart disease of native coronary artery without angina pectoris: Secondary | ICD-10-CM | POA: Insufficient documentation

## 2024-10-03 DIAGNOSIS — I482 Chronic atrial fibrillation, unspecified: Secondary | ICD-10-CM | POA: Insufficient documentation

## 2024-10-03 DIAGNOSIS — M545 Low back pain, unspecified: Secondary | ICD-10-CM | POA: Diagnosis not present

## 2024-10-03 DIAGNOSIS — M7989 Other specified soft tissue disorders: Secondary | ICD-10-CM | POA: Insufficient documentation

## 2024-10-03 DIAGNOSIS — G8929 Other chronic pain: Secondary | ICD-10-CM | POA: Insufficient documentation

## 2024-10-03 DIAGNOSIS — M79661 Pain in right lower leg: Secondary | ICD-10-CM | POA: Insufficient documentation

## 2024-10-03 DIAGNOSIS — I110 Hypertensive heart disease with heart failure: Secondary | ICD-10-CM | POA: Insufficient documentation

## 2024-10-03 DIAGNOSIS — M79604 Pain in right leg: Secondary | ICD-10-CM

## 2024-10-03 DIAGNOSIS — Z7901 Long term (current) use of anticoagulants: Secondary | ICD-10-CM | POA: Insufficient documentation

## 2024-10-03 DIAGNOSIS — J449 Chronic obstructive pulmonary disease, unspecified: Secondary | ICD-10-CM | POA: Insufficient documentation

## 2024-10-03 MED ORDER — PREDNISONE 20 MG PO TABS
60.0000 mg | ORAL_TABLET | Freq: Once | ORAL | Status: AC
  Administered 2024-10-03: 60 mg via ORAL

## 2024-10-03 MED ORDER — PREDNISONE 20 MG PO TABS
ORAL_TABLET | ORAL | Status: AC
  Filled 2024-10-03: qty 3

## 2024-10-03 NOTE — ED Triage Notes (Signed)
 Pt reports right lower leg pain/swelling x few days, denies injury; unable to get around today. Pt reports lower back pain that radiates to right leg, took oxycodone  10mg -325mg  around 1700, not much relief.

## 2024-10-03 NOTE — ED Provider Notes (Signed)
 [facility] Emergency Department Provider Note     Event Date/Time   First MD Initiated Contact with Patient 10/03/24 2208     (approximate)   History   Back Pain and Leg Pain   HPI  Lawrence [name] is a 68 y.o. male with a history of CAD, DM type II, HTN, COPD, CHF, chronic A-fib on Eliquis , GERD, and DDD, who presents to the ED endorsing right lower leg pain with some subjective swelling for the last few days.  Patient denies any recent injury, trauma, or falls.  He reports pain at originates in his lower back and radiates to his right leg.  He took his oxycodone  10 mg tablet around 1700 and denies any significant benefit.  He denies any bladder or bowel incontinence, foot drop, saddle anesthesia.  Patient reports the pain is consistent with his base pain.  He reports minimal improvement of his pain despite taking regular doses of his chronic pain medicines.  Physical Exam   Triage Vital Signs: ED Triage Vitals  Encounter Vitals Group     BP 10/03/24 2111 (!) 116/93     Girls Systolic BP Percentile --      Girls Diastolic BP Percentile --      Boys Systolic BP Percentile --      Boys Diastolic BP Percentile --      Pulse Rate 10/03/24 2111 76     Resp 10/03/24 2111 18     Temp 10/03/24 2111 (!) 97.5 F (36.4 C)     Temp Source 10/03/24 2111 Oral     SpO2 10/03/24 2111 95 %     Weight --      Height --      Head Circumference --      Peak Flow --      Pain Score 10/03/24 2115 9     Pain Loc --      Pain Education --      Exclude from Growth Chart --     Most recent vital signs: Vitals:   10/03/24 2111 10/03/24 2237  BP: (!) 116/93 120/87  Pulse: 76 79  Resp: 18 18  Temp: (!) 97.5 F (36.4 C) 98.1 F (36.7 C)  SpO2: 95% 96%    General Awake, no distress. NAD HEENT NCAT. PERRL. EOMI. No rhinorrhea. Mucous membranes are moist.  CV:  Good peripheral perfusion. RRR.  No CCE distally.  Normal distal pulses. RESP:  Normal effort.  CTA ABD:  No distention.  MSK:  Normal spinal alignment without midline tenderness, spasm, homely, or step-off.  AROM of all activities. NEURO: Negative seated straight leg raise bilaterally.  Normal LE DTRs bilaterally   ED Results / Procedures / Treatments   Labs (all labs ordered are listed, but only abnormal results are displayed) Labs Reviewed - No data to display   EKG   RADIOLOGY  No results found.   PROCEDURES:  Critical Care performed: No  Procedures   MEDICATIONS ORDERED IN ED: Medications  predniSONE  (DELTASONE ) 20 MG tablet (has no administration in time range)  predniSONE  (DELTASONE ) tablet 60 mg (60 mg Oral Given 10/03/24 2225)     IMPRESSION / MDM / ASSESSMENT AND PLAN / ED COURSE  I reviewed the triage vital signs and the nursing notes.                              Differential diagnosis includes, but is  not limited to, DDD, lumbar radiculopathy, myalgias, chronic pain  Patient's presentation is most consistent with acute, uncomplicated illness.   Patient's diagnosis is consistent with chronic pain related to his underlying DDD.  Patient presents to the ED for evaluation of his baseline pain to the right leg without recent injury, trauma, or fall.  Exam is reassuring without evidence of acute DVT, acute neuromuscular deficit, or recent trauma.  Patient will be discharged home with prescriptions for prednisone . Patient is to follow up with his PCP as needed or otherwise directed. Patient is given ED precautions to return to the ED for any worsening or new symptoms.     FINAL CLINICAL IMPRESSION(S) / ED DIAGNOSES   Final diagnoses:  Chronic right-sided low back pain without sciatica  Right leg pain     Rx / DC Orders   ED Discharge Orders          Ordered    predniSONE  (DELTASONE ) 20 MG tablet  2 times daily with meals        10/04/24 0012             Note:  This document was prepared using Dragon voice recognition software and may  include unintentional dictation errors.    Loyd Candida LULLA Aldona, PA-C 10/04/24 0013    Bradler, Evan K, MD 10/07/24 [phone]

## 2024-10-04 MED ORDER — PREDNISONE 20 MG PO TABS: 20.0000 mg | ORAL_TABLET | Freq: Two times a day (BID) | ORAL | 0 refills | Status: AC

## 2024-10-05 ENCOUNTER — Inpatient Hospital Stay
Admission: EM | Admit: 2024-10-05 | Discharge: 2024-10-09 | DRG: 191 | Disposition: A | Discharge status: Home Health | Attending: Internal Medicine | Admitting: Internal Medicine

## 2024-10-05 ENCOUNTER — Telehealth: Payer: Self-pay | Admitting: Family Medicine

## 2024-10-05 ENCOUNTER — Emergency Department

## 2024-10-05 DIAGNOSIS — Z888 Allergy status to other drugs, medicaments and biological substances status: Secondary | ICD-10-CM

## 2024-10-05 DIAGNOSIS — F418 Other specified anxiety disorders: Secondary | ICD-10-CM | POA: Diagnosis present

## 2024-10-05 DIAGNOSIS — Z7901 Long term (current) use of anticoagulants: Secondary | ICD-10-CM

## 2024-10-05 DIAGNOSIS — R072 Precordial pain: Secondary | ICD-10-CM | POA: Diagnosis present

## 2024-10-05 DIAGNOSIS — Z9049 Acquired absence of other specified parts of digestive tract: Secondary | ICD-10-CM

## 2024-10-05 DIAGNOSIS — Z87891 Personal history of nicotine dependence: Secondary | ICD-10-CM

## 2024-10-05 DIAGNOSIS — Z5941 Food insecurity: Secondary | ICD-10-CM

## 2024-10-05 DIAGNOSIS — Z860101 Personal history of adenomatous and serrated colon polyps: Secondary | ICD-10-CM

## 2024-10-05 DIAGNOSIS — Z79899 Other long term (current) drug therapy: Secondary | ICD-10-CM

## 2024-10-05 DIAGNOSIS — R7989 Other specified abnormal findings of blood chemistry: Secondary | ICD-10-CM | POA: Diagnosis present

## 2024-10-05 DIAGNOSIS — Z818 Family history of other mental and behavioral disorders: Secondary | ICD-10-CM

## 2024-10-05 DIAGNOSIS — Z87442 Personal history of urinary calculi: Secondary | ICD-10-CM

## 2024-10-05 DIAGNOSIS — I255 Ischemic cardiomyopathy: Secondary | ICD-10-CM | POA: Diagnosis present

## 2024-10-05 DIAGNOSIS — R531 Weakness: Secondary | ICD-10-CM | POA: Diagnosis not present

## 2024-10-05 DIAGNOSIS — Z825 Family history of asthma and other chronic lower respiratory diseases: Secondary | ICD-10-CM

## 2024-10-05 DIAGNOSIS — R778 Other specified abnormalities of plasma proteins: Secondary | ICD-10-CM | POA: Diagnosis not present

## 2024-10-05 DIAGNOSIS — Z9981 Dependence on supplemental oxygen: Secondary | ICD-10-CM

## 2024-10-05 DIAGNOSIS — I5022 Chronic systolic (congestive) heart failure: Secondary | ICD-10-CM | POA: Diagnosis present

## 2024-10-05 DIAGNOSIS — R079 Chest pain, unspecified: Secondary | ICD-10-CM

## 2024-10-05 DIAGNOSIS — I10 Essential (primary) hypertension: Secondary | ICD-10-CM

## 2024-10-05 DIAGNOSIS — Z8249 Family history of ischemic heart disease and other diseases of the circulatory system: Secondary | ICD-10-CM

## 2024-10-05 DIAGNOSIS — Z951 Presence of aortocoronary bypass graft: Secondary | ICD-10-CM

## 2024-10-05 DIAGNOSIS — I4819 Other persistent atrial fibrillation: Secondary | ICD-10-CM | POA: Diagnosis present

## 2024-10-05 DIAGNOSIS — F419 Anxiety disorder, unspecified: Secondary | ICD-10-CM | POA: Diagnosis present

## 2024-10-05 DIAGNOSIS — Z6826 Body mass index (BMI) 26.0-26.9, adult: Secondary | ICD-10-CM

## 2024-10-05 DIAGNOSIS — J441 Chronic obstructive pulmonary disease with (acute) exacerbation: Secondary | ICD-10-CM | POA: Diagnosis not present

## 2024-10-05 DIAGNOSIS — Z882 Allergy status to sulfonamides status: Secondary | ICD-10-CM

## 2024-10-05 DIAGNOSIS — E1151 Type 2 diabetes mellitus with diabetic peripheral angiopathy without gangrene: Secondary | ICD-10-CM | POA: Diagnosis present

## 2024-10-05 DIAGNOSIS — J9611 Chronic respiratory failure with hypoxia: Secondary | ICD-10-CM | POA: Diagnosis present

## 2024-10-05 DIAGNOSIS — Z885 Allergy status to narcotic agent status: Secondary | ICD-10-CM

## 2024-10-05 DIAGNOSIS — M79661 Pain in right lower leg: Secondary | ICD-10-CM | POA: Diagnosis present

## 2024-10-05 DIAGNOSIS — M545 Low back pain, unspecified: Secondary | ICD-10-CM | POA: Diagnosis present

## 2024-10-05 DIAGNOSIS — Z833 Family history of diabetes mellitus: Secondary | ICD-10-CM

## 2024-10-05 DIAGNOSIS — I959 Hypotension, unspecified: Secondary | ICD-10-CM | POA: Diagnosis present

## 2024-10-05 DIAGNOSIS — I252 Old myocardial infarction: Secondary | ICD-10-CM

## 2024-10-05 DIAGNOSIS — I2510 Atherosclerotic heart disease of native coronary artery without angina pectoris: Secondary | ICD-10-CM | POA: Diagnosis present

## 2024-10-05 DIAGNOSIS — E119 Type 2 diabetes mellitus without complications: Secondary | ICD-10-CM

## 2024-10-05 DIAGNOSIS — Z820 Family history of epilepsy and other diseases of the nervous system: Secondary | ICD-10-CM

## 2024-10-05 DIAGNOSIS — G4733 Obstructive sleep apnea (adult) (pediatric): Secondary | ICD-10-CM | POA: Diagnosis present

## 2024-10-05 DIAGNOSIS — Z85828 Personal history of other malignant neoplasm of skin: Secondary | ICD-10-CM

## 2024-10-05 DIAGNOSIS — Z555 Less than a high school diploma: Secondary | ICD-10-CM

## 2024-10-05 DIAGNOSIS — Z955 Presence of coronary angioplasty implant and graft: Secondary | ICD-10-CM

## 2024-10-05 DIAGNOSIS — I110 Hypertensive heart disease with heart failure: Secondary | ICD-10-CM | POA: Diagnosis present

## 2024-10-05 DIAGNOSIS — I739 Peripheral vascular disease, unspecified: Secondary | ICD-10-CM | POA: Diagnosis present

## 2024-10-05 DIAGNOSIS — R0789 Other chest pain: Secondary | ICD-10-CM | POA: Diagnosis not present

## 2024-10-05 DIAGNOSIS — G8929 Other chronic pain: Secondary | ICD-10-CM | POA: Diagnosis present

## 2024-10-05 DIAGNOSIS — E43 Unspecified severe protein-calorie malnutrition: Secondary | ICD-10-CM | POA: Diagnosis present

## 2024-10-05 DIAGNOSIS — F32A Depression, unspecified: Secondary | ICD-10-CM | POA: Diagnosis present

## 2024-10-05 DIAGNOSIS — I482 Chronic atrial fibrillation, unspecified: Secondary | ICD-10-CM | POA: Diagnosis present

## 2024-10-05 LAB — BASIC METABOLIC PANEL WITH GFR
Anion gap: 13 (ref 5–15)
BUN: 19 mg/dL (ref 8–23)
CO2: 19 mmol/L — ABNORMAL LOW (ref 22–32)
Calcium: 6.4 mg/dL — CL (ref 8.9–10.3)
Chloride: 110 mmol/L (ref 98–111)
Creatinine, Ser: 0.8 mg/dL (ref 0.61–1.24)
GFR, Estimated: >60 mL/min (ref >60)
Glucose, Bld: 77 mg/dL (ref 70–99)
Potassium: 3.2 mmol/L — ABNORMAL LOW (ref 3.5–5.1)
Sodium: 143 mmol/L (ref 135–145)

## 2024-10-05 LAB — RESP PANEL BY RT-PCR (RSV, FLU A&B, COVID)  RVPGX2
Influenza A by PCR: NEGATIVE
Influenza B by PCR: NEGATIVE
Resp Syncytial Virus by PCR: NEGATIVE
SARS Coronavirus 2 by RT PCR: NEGATIVE

## 2024-10-05 LAB — GLUCOSE, CAPILLARY: Glucose-Capillary: 144 mg/dL — ABNORMAL HIGH (ref 70–99)

## 2024-10-05 LAB — CBC
HCT: 48.9 % (ref 39.0–52.0)
Hemoglobin: 16.4 g/dL (ref 13.0–17.0)
MCH: 30.1 pg (ref 26.0–34.0)
MCHC: 33.5 g/dL (ref 30.0–36.0)
MCV: 89.7 fL (ref 80.0–100.0)
Platelets: 189 K/uL (ref 150–400)
RBC: 5.45 MIL/uL (ref 4.22–5.81)
RDW: 14.7 % (ref 11.5–15.5)
WBC: 7.4 K/uL (ref 4.0–10.5)
nRBC: 0 % (ref 0.0–0.2)

## 2024-10-05 LAB — TROPONIN T, HIGH SENSITIVITY
Troponin T High Sensitivity: 28 ng/L — ABNORMAL HIGH (ref 0–19)
Troponin T High Sensitivity: 27 ng/L — ABNORMAL HIGH (ref 0–19)

## 2024-10-05 LAB — D-DIMER, QUANTITATIVE: D-Dimer, Quant: 0.28 ug{FEU}/mL (ref 0.00–0.50)

## 2024-10-05 LAB — PROTIME-INR
INR: 1.3 — ABNORMAL HIGH (ref 0.8–1.2)
Prothrombin Time: 17.2 s — ABNORMAL HIGH (ref 11.4–15.2)

## 2024-10-05 MED ORDER — OXYCODONE-ACETAMINOPHEN 5-325 MG PO TABS
1.0000 | ORAL_TABLET | ORAL | Status: DC | PRN
  Administered 2024-10-05 – 2024-10-09 (×10): 1 via ORAL
  Filled 2024-10-05 (×10): qty 1

## 2024-10-05 MED ORDER — ALPRAZOLAM 0.5 MG PO TABS
1.0000 mg | ORAL_TABLET | Freq: Three times a day (TID) | ORAL | Status: DC | PRN
Start: 2024-10-05 — End: 2024-10-09
  Administered 2024-10-05 – 2024-10-08 (×3): 1 mg via ORAL
  Filled 2024-10-05 (×5): qty 2

## 2024-10-05 MED ORDER — OXYCODONE-ACETAMINOPHEN 10-325 MG PO TABS: 1.0000 | ORAL_TABLET | ORAL | Status: DC | PRN

## 2024-10-05 MED ORDER — OXYCODONE HCL 5 MG PO TABS
5.0000 mg | ORAL_TABLET | ORAL | Status: DC | PRN
  Administered 2024-10-06 – 2024-10-09 (×8): 5 mg via ORAL
  Filled 2024-10-05 (×8): qty 1

## 2024-10-05 MED ORDER — PREDNISONE 20 MG PO TABS
40.0000 mg | ORAL_TABLET | Freq: Every day | ORAL | Status: DC
  Administered 2024-10-07 – 2024-10-09 (×3): 40 mg via ORAL
  Filled 2024-10-05 (×3): qty 2

## 2024-10-05 MED ORDER — ALLOPURINOL 100 MG PO TABS
300.0000 mg | ORAL_TABLET | Freq: Two times a day (BID) | ORAL | Status: DC
  Administered 2024-10-05 – 2024-10-09 (×8): 300 mg via ORAL
  Filled 2024-10-05: qty 1
  Filled 2024-10-05 (×8): qty 3

## 2024-10-05 MED ORDER — LEVALBUTEROL HCL 1.25 MG/0.5ML IN NEBU
1.2500 mg | INHALATION_SOLUTION | Freq: Four times a day (QID) | RESPIRATORY_TRACT | Status: DC | PRN
  Administered 2024-10-08: 1.25 mg via RESPIRATORY_TRACT
  Filled 2024-10-05: qty 0.5

## 2024-10-05 MED ORDER — SACUBITRIL-VALSARTAN 49-51 MG PO TABS
1.0000 | ORAL_TABLET | Freq: Two times a day (BID) | ORAL | Status: DC
  Administered 2024-10-05 – 2024-10-09 (×8): 1 via ORAL
  Filled 2024-10-05 (×9): qty 1

## 2024-10-05 MED ORDER — FAMOTIDINE 20 MG PO TABS
20.0000 mg | ORAL_TABLET | Freq: Two times a day (BID) | ORAL | Status: DC
  Administered 2024-10-05 – 2024-10-09 (×8): 20 mg via ORAL
  Filled 2024-10-05 (×8): qty 1

## 2024-10-05 MED ORDER — LEVALBUTEROL HCL 1.25 MG/0.5ML IN NEBU
1.2500 mg | INHALATION_SOLUTION | Freq: Once | RESPIRATORY_TRACT | Status: AC
  Administered 2024-10-05: 1.25 mg via RESPIRATORY_TRACT
  Filled 2024-10-05: qty 0.5

## 2024-10-05 MED ORDER — ONDANSETRON HCL 4 MG/2ML IJ SOLN
4.0000 mg | Freq: Four times a day (QID) | INTRAMUSCULAR | Status: DC | PRN
  Administered 2024-10-05 – 2024-10-07 (×3): 4 mg via INTRAVENOUS
  Filled 2024-10-05 (×3): qty 2

## 2024-10-05 MED ORDER — AZITHROMYCIN 500 MG PO TABS
500.0000 mg | ORAL_TABLET | Freq: Once | ORAL | Status: AC
  Administered 2024-10-05: 500 mg via ORAL
  Filled 2024-10-05: qty 1

## 2024-10-05 MED ORDER — INSULIN ASPART 100 UNIT/ML IJ SOLN
0.0000 [IU] | Freq: Three times a day (TID) | INTRAMUSCULAR | Status: DC
  Administered 2024-10-06: 5 [IU] via SUBCUTANEOUS
  Administered 2024-10-06: 8 [IU] via SUBCUTANEOUS
  Administered 2024-10-06: 5 [IU] via SUBCUTANEOUS
  Administered 2024-10-07 (×2): 3 [IU] via SUBCUTANEOUS
  Administered 2024-10-07: 2 [IU] via SUBCUTANEOUS
  Administered 2024-10-08: 8 [IU] via SUBCUTANEOUS
  Administered 2024-10-08: 3 [IU] via SUBCUTANEOUS
  Administered 2024-10-09: 2 [IU] via SUBCUTANEOUS
  Administered 2024-10-09: 3 [IU] via SUBCUTANEOUS
  Filled 2024-10-05: qty 2
  Filled 2024-10-05 (×2): qty 3
  Filled 2024-10-05: qty 5
  Filled 2024-10-05: qty 3
  Filled 2024-10-05: qty 5
  Filled 2024-10-05: qty 3
  Filled 2024-10-05: qty 8
  Filled 2024-10-05: qty 2
  Filled 2024-10-05: qty 8

## 2024-10-05 MED ORDER — ISOSORBIDE MONONITRATE ER 60 MG PO TB24
120.0000 mg | ORAL_TABLET | Freq: Every day | ORAL | Status: DC
  Administered 2024-10-06 – 2024-10-09 (×4): 120 mg via ORAL
  Filled 2024-10-05 (×4): qty 2

## 2024-10-05 MED ORDER — IPRATROPIUM BROMIDE 0.02 % IN SOLN
0.5000 mg | Freq: Once | RESPIRATORY_TRACT | Status: AC
  Administered 2024-10-05: 0.5 mg via RESPIRATORY_TRACT
  Filled 2024-10-05: qty 2.5

## 2024-10-05 MED ORDER — ATORVASTATIN CALCIUM 80 MG PO TABS
80.0000 mg | ORAL_TABLET | Freq: Every day | ORAL | Status: DC
  Administered 2024-10-05 – 2024-10-08 (×4): 80 mg via ORAL
  Filled 2024-10-05 (×4): qty 1

## 2024-10-05 MED ORDER — METHOCARBAMOL 500 MG PO TABS
500.0000 mg | ORAL_TABLET | Freq: Three times a day (TID) | ORAL | Status: DC | PRN
Start: 2024-10-05 — End: 2024-10-09

## 2024-10-05 MED ORDER — ACETAMINOPHEN 325 MG PO TABS: 650.0000 mg | ORAL_TABLET | ORAL | Status: DC | PRN

## 2024-10-05 MED ORDER — CALCIUM GLUCONATE-NACL 1-0.675 GM/50ML-% IV SOLN
1.0000 g | Freq: Once | INTRAVENOUS | Status: AC
  Administered 2024-10-05: 1000 mg via INTRAVENOUS
  Filled 2024-10-05: qty 50

## 2024-10-05 MED ORDER — RANOLAZINE ER 500 MG PO TB12
1000.0000 mg | ORAL_TABLET | Freq: Two times a day (BID) | ORAL | Status: DC
  Administered 2024-10-05 – 2024-10-09 (×8): 1000 mg via ORAL
  Filled 2024-10-05 (×9): qty 2

## 2024-10-05 MED ORDER — ASPIRIN 81 MG PO CHEW
324.0000 mg | CHEWABLE_TABLET | Freq: Once | ORAL | Status: AC
  Administered 2024-10-05: 324 mg via ORAL
  Filled 2024-10-05: qty 4

## 2024-10-05 MED ORDER — GUAIFENESIN ER 600 MG PO TB12
1200.0000 mg | ORAL_TABLET | Freq: Two times a day (BID) | ORAL | Status: DC
  Administered 2024-10-05 – 2024-10-09 (×8): 1200 mg via ORAL
  Filled 2024-10-05 (×8): qty 2

## 2024-10-05 MED ORDER — METHYLPREDNISOLONE SODIUM SUCC 40 MG IJ SOLR
40.0000 mg | Freq: Two times a day (BID) | INTRAMUSCULAR | Status: AC
  Administered 2024-10-06 (×2): 40 mg via INTRAVENOUS
  Filled 2024-10-05 (×2): qty 1

## 2024-10-05 MED ORDER — PANTOPRAZOLE SODIUM 40 MG PO TBEC
40.0000 mg | DELAYED_RELEASE_TABLET | Freq: Every day | ORAL | Status: DC
  Administered 2024-10-06 – 2024-10-09 (×4): 40 mg via ORAL
  Filled 2024-10-05 (×4): qty 1

## 2024-10-05 MED ORDER — SPIRONOLACTONE 25 MG PO TABS
25.0000 mg | ORAL_TABLET | Freq: Every day | ORAL | Status: DC
  Administered 2024-10-06: 25 mg via ORAL
  Filled 2024-10-05: qty 1

## 2024-10-05 MED ORDER — NITROGLYCERIN 0.4 MG SL SUBL: 0.4000 mg | SUBLINGUAL_TABLET | SUBLINGUAL | Status: DC | PRN

## 2024-10-05 MED ORDER — OMEGA-3-ACID ETHYL ESTERS 1 G PO CAPS
2.0000 g | ORAL_CAPSULE | Freq: Two times a day (BID) | ORAL | Status: DC
  Administered 2024-10-05 – 2024-10-09 (×8): 2 g via ORAL
  Filled 2024-10-05 (×9): qty 2

## 2024-10-05 MED ORDER — PREDNISONE 20 MG PO TABS
60.0000 mg | ORAL_TABLET | Freq: Once | ORAL | Status: AC
  Administered 2024-10-05: 60 mg via ORAL
  Filled 2024-10-05: qty 3

## 2024-10-05 MED ORDER — APIXABAN 5 MG PO TABS
5.0000 mg | ORAL_TABLET | Freq: Two times a day (BID) | ORAL | Status: DC
  Administered 2024-10-05 – 2024-10-06 (×2): 5 mg via ORAL
  Filled 2024-10-05 (×2): qty 1

## 2024-10-05 MED ORDER — INSULIN ASPART 100 UNIT/ML IJ SOLN: 0.0000 [IU] | Freq: Every day | INTRAMUSCULAR | Status: DC

## 2024-10-05 MED ORDER — TRAZODONE HCL 100 MG PO TABS
150.0000 mg | ORAL_TABLET | Freq: Every evening | ORAL | Status: DC | PRN
  Administered 2024-10-05 – 2024-10-08 (×2): 150 mg via ORAL
  Filled 2024-10-05 (×2): qty 1

## 2024-10-05 MED ORDER — DAPAGLIFLOZIN PROPANEDIOL 10 MG PO TABS
10.0000 mg | ORAL_TABLET | Freq: Every day | ORAL | Status: DC
  Administered 2024-10-06 – 2024-10-09 (×4): 10 mg via ORAL
  Filled 2024-10-05 (×4): qty 1

## 2024-10-05 NOTE — Telephone Encounter (Unsigned)
 Copied from CRM [id number]. Topic: Clinical - Medication Refill >> Oct 05, 2024  4:39 PM Alfonso HERO wrote: Medication:  oxyCODONE -acetaminophen  (PERCOCET) 10-325 MG tablet  ALPRAZolam  (XANAX ) 1 MG tablet   Has the patient contacted their pharmacy? No (Agent: If no, request that the patient contact the pharmacy for the refill. If patient does not wish to contact the pharmacy document the reason why and proceed with request.) (Agent: If yes, when and what did the pharmacy advise?)  This is the patient's preferred pharmacy:  [facility] - [place], [place] - [address] [place] [address] Phone: [phone] Fax: [phone]   Is this the correct pharmacy for this prescription? No If no, delete pharmacy and type the correct one.   Has the prescription been filled recently? Yes  Is the patient out of the medication? Yes  Has the patient been seen for an appointment in the last year OR does the patient have an upcoming appointment? Yes  Can we respond through MyChart? Yes  Agent: Please be advised that Rx refills may take up to 3 business days. We ask that you follow-up with your pharmacy.

## 2024-10-05 NOTE — H&P (Addendum)
 History and Physical    Patient: Lawrence [name] FMW:[id number] DOB: [birth year] DOA: 10/05/2024 DOS: the patient was seen and examined on 10/05/2024 PCP: Lawrence Nancyann BRAVO, MD  Patient coming from: Home  Chief Complaint:  Chief Complaint  Patient presents with   Shortness of Breath    HPI: Lawrence [name] is a 68 y.o. male with medical history significant for CAD (s/p of stent x 4/CABG 2016), A-fib on Eliquis , HFrEF (EF 35 to 40%, 07/2024), PSVT/frequent PVCs, HTN,DM, COPD on 2L O2, OSA not using CPAP, multiple prior hospitalizations related to chest pain/CHF/COPD, most recently 6 weeks ago (10/5 to 08/22/2024, [facility]), being admitted with chest pain and elevated troponin in the setting of a COPD exacerbation.  Patient describes a several day history of cough and shortness of breath associated with chest pressure which did not resolve with home breathing treatments.  He has had no fever and chills.  Denies leg pain or swelling.  Due to worsening chest pain he presented to the hospital.  Of note, during his recent admission to [facility], he did have chest pain and was evaluated by cardiology. They believed it chronic and attributed to an upper respiratory tract infection . Troponin was normal. Ischemic workup was not indicated per cardiology.SABRA  His symptoms improved with oxycodone . In the ED, he was mildly tachycardic to 115 with otherwise normal vitals, saturating at 100% on his home flow rate of oxygen  at 2 L. Labs notable for first troponin of 28 CBC normal.  BMP notable for potassium 3.2, bicarb 19 and calcium  6.4. Respiratory viral panel negative EKG showed A-fib at 107 with no other concerning findings suggestive of ischemia. Chest x-ray clear  Patient was treated with Xopenex  (albuterol  intolerant) as well as prednisone  and azithromycin .  He was given a chewable aspirin .  Also given calcium  repletion in the form of calcium  gluconate.  Admission requested high risk chest pain rule out and COPD  exacerbation     Past Medical History:  Diagnosis Date   A-fib (HCC)    Anemia    Anginal pain    Anxiety    Arthritis    Asthma    Atrial fibrillation, chronic (HCC) 10/19/2023   CAD (coronary artery disease)    a. 2002 CABGx2 (LIMA->LAD, VG->VG->OM1);  b. 09/2012 DES->OM;  c. 03/2015 PTCA of LAD [facility]) in setting of atretic LIMA; d. 05/2015 Cath [facility]): nonobs dzs; e. 06/2015 Cath ([facility]): LM nl, LAD 45p/d ISR, 50d, D1/2 small, LCX 50p/d ISR, OM1 70ost, 30 ISR, VG->OM1 50ost, 49m, LIMA->LAD 99p/d - atretic, RCA dom, nl; f.cath 10/16: 40-50%(FFR 0.90) pLAD, 75% (FFR 0.77) mLAD s/p PCI/DES, oRCA 40% (FFR0.95)   Cancer (HCC)    SKIN CANCER ON BACK   Celiac disease    Chronic diastolic CHF (congestive heart failure) (HCC)    a. 06/2009 Echo: EF 60-65%, Gr 1 DD, triv AI, mildly dil LA, nl RV.   COPD (chronic obstructive pulmonary disease) (HCC)    a. Chronic bronchitis and emphysema.   DDD (degenerative disc disease), lumbar    Diverticulosis    Dysrhythmia    Essential hypertension    GERD (gastroesophageal reflux disease)    History of hiatal hernia    History of kidney stones    H/O   History of tobacco abuse    a. Quit 2014.   Myocardial infarction [facility]) 2002   4 STENTS   Pancreatitis    PSVT (paroxysmal supraventricular tachycardia)    a. 10/2012 Noted on Zio  Patch.   RLL pneumonia 05/30/2024   Sleep apnea    LOST CORD TO CPAP -ONLY 02 @ BEDTIME   Tubular adenoma of colon    Type II diabetes mellitus (HCC)    Past Surgical History:  Procedure Laterality Date   BYPASS GRAFT     CARDIAC CATHETERIZATION N/A 07/12/2015   rocedure: Left Heart Cath and Cors/Grafts Angiography;  Surgeon: Victory LELON Sharps, MD;  Location: [facility] INVASIVE CV LAB;  Service: Cardiovascular;  Laterality: N/A;   CARDIAC CATHETERIZATION Right 10/07/2015   Procedure: Left Heart Cath and Cors/Grafts Angiography;  Surgeon: Denyse DELENA Bathe, MD;  Location: [facility] INVASIVE CV LAB;  Service: Cardiovascular;  Laterality:  Right;   CARDIAC CATHETERIZATION N/A 04/06/2016   Procedure: Left Heart Cath and Coronary Angiography;  Surgeon: Cara JONETTA Lovelace, MD;  Location: [facility] INVASIVE CV LAB;  Service: Cardiovascular;  Laterality: N/A;   CARDIAC CATHETERIZATION  04/06/2016   Procedure: Bypass Graft Angiography;  Surgeon: Cara JONETTA Lovelace, MD;  Location: [facility] INVASIVE CV LAB;  Service: Cardiovascular;;   CARDIAC CATHETERIZATION N/A 11/02/2016   Procedure: Left Heart Cath and Cors/Grafts Angiography and possible PCI;  Surgeon: Cara JONETTA Lovelace, MD;  Location: [facility] INVASIVE CV LAB;  Service: Cardiovascular;  Laterality: N/A;   CARDIAC CATHETERIZATION N/A 11/02/2016   Procedure: Coronary Stent Intervention;  Surgeon: Cara JONETTA Lovelace, MD;  Location: [facility] INVASIVE CV LAB;  Service: Cardiovascular;  Laterality: N/A;   CARDIOVERSION N/A 06/29/2022   Procedure: CARDIOVERSION;  Surgeon: Hester Wolm PARAS, MD;  Location: [facility] ORS;  Service: Cardiovascular;  Laterality: N/A;   CHOLECYSTECTOMY     CIRCUMCISION N/A 06/09/2019   Procedure: CIRCUMCISION ADULT;  Surgeon: Francisca Redell BROCKS, MD;  Location: [facility] ORS;  Service: Urology;  Laterality: N/A;   COLONOSCOPY WITH PROPOFOL  N/A 04/01/2018   Procedure: COLONOSCOPY WITH PROPOFOL ;  Surgeon: Viktoria Lamar DASEN, MD;  Location: [facility] ENDOSCOPY;  Service: Endoscopy;  Laterality: N/A;   COLONOSCOPY WITH PROPOFOL  N/A 05/01/2023   Procedure: COLONOSCOPY WITH PROPOFOL ;  Surgeon: Unk Corinn Skiff, MD;  Location: [facility] ENDOSCOPY;  Service: Gastroenterology;  Laterality: N/A;   ESOPHAGEAL DILATION     ESOPHAGOGASTRODUODENOSCOPY (EGD) WITH PROPOFOL  N/A 04/01/2018   Procedure: ESOPHAGOGASTRODUODENOSCOPY (EGD) WITH PROPOFOL ;  Surgeon: Viktoria Lamar DASEN, MD;  Location: [facility] ENDOSCOPY;  Service: Endoscopy;  Laterality: N/A;   ESOPHAGOGASTRODUODENOSCOPY (EGD) WITH PROPOFOL  N/A 05/01/2023   Procedure: ESOPHAGOGASTRODUODENOSCOPY (EGD) WITH PROPOFOL ;  Surgeon: Unk Corinn Skiff, MD;  Location: [facility]  ENDOSCOPY;  Service: Gastroenterology;  Laterality: N/A;   GIVENS CAPSULE STUDY  05/01/2023   Procedure: GIVENS CAPSULE STUDY;  Surgeon: Unk Corinn Skiff, MD;  Location: [facility] ENDOSCOPY;  Service: Gastroenterology;;   LEFT HEART CATH AND CORS/GRAFTS ANGIOGRAPHY N/A 06/12/2019   Procedure: LEFT HEART CATH AND CORS/GRAFTS ANGIOGRAPHY;  Surgeon: Bosie Vinie DELENA, MD;  Location: [facility] INVASIVE CV LAB;  Service: Cardiovascular;  Laterality: N/A;   LEFT HEART CATH AND CORS/GRAFTS ANGIOGRAPHY N/A 03/11/2020   Procedure: LEFT HEART CATH AND CORS/GRAFTS ANGIOGRAPHY;  Surgeon: Ammon Blunt, MD;  Location: [facility] INVASIVE CV LAB;  Service: Cardiovascular;  Laterality: N/A;   LEFT HEART CATH AND CORS/GRAFTS ANGIOGRAPHY N/A 05/01/2021   Procedure: LEFT HEART CATH AND CORS/GRAFTS ANGIOGRAPHY;  Surgeon: Hester Wolm PARAS, MD;  Location: [facility] INVASIVE CV LAB;  Service: Cardiovascular;  Laterality: N/A;   LEFT HEART CATH AND CORS/GRAFTS ANGIOGRAPHY N/A 11/02/2022   Procedure: LEFT HEART CATH AND CORS/GRAFTS ANGIOGRAPHY;  Surgeon: Lovelace Cara JONETTA, MD;  Location: [facility] INVASIVE CV LAB;  Service: Cardiovascular;  Laterality: N/A;  TONSILLECTOMY     VASCULAR SURGERY     Social History:  reports that he quit smoking about 11 years ago. His smoking use included cigarettes. He started smoking about 62 years ago. He has a 102 pack-year smoking history. He has never used smokeless tobacco. He reports that he does not currently use drugs after having used the following drugs: Marijuana. He reports that he does not drink alcohol.  Allergies  Allergen Reactions   Demerol  [Meperidine Hcl]    Demerol [Meperidine] Hives   Morphine      Pill form not tolerated. Can have fluid form   Sulfa Antibiotics Hives   Albuterol  Other (See Comments) and Palpitations    Pt currently uses this medication.   Empagliflozin  Other (See Comments) and Hives    Perineal pain  Other Reaction(s): Other (See Comments)   Morphine  Sulfate  Nausea And Vomiting, Rash and Other (See Comments)    Pt states that he is only allergic to the tablet form of this medication.      Family History  Problem Relation Age of Onset   Heart attack Mother    Depression Mother    Heart disease Mother    COPD Mother    Hypertension Mother    Heart attack Father    Diabetes Father    Depression Father    Heart disease Father    Cirrhosis Father    Parkinson's disease Brother     Prior to Admission medications   Medication Sig Start Date End Date Taking? Authorizing Provider  Accu-Chek Softclix Lancets lancets Use as instructed to check sugar daily for type 2 diabetes. 03/03/21   Lawrence Nancyann BRAVO, MD  acetaminophen  (TYLENOL ) 325 MG tablet Take 2 tablets (650 mg total) by mouth every 6 (six) hours as needed for mild pain (pain score 1-3) or fever (or Fever >/= 101). 08/15/24   Dorinda Drue DASEN, MD  albuterol  (PROVENTIL ) (2.5 MG/3ML) 0.083% nebulizer solution Take 3 mLs (2.5 mg total) by nebulization every 6 (six) hours as needed for wheezing or shortness of breath. 05/26/24   Lawrence Nancyann BRAVO, MD  allopurinol  (ZYLOPRIM ) 300 MG tablet TAKE 1 TABLET BY MOUTH TWICE A DAY 04/12/24   Lawrence Nancyann BRAVO, MD  ALPRAZolam  (XANAX ) 1 MG tablet TAKE 1 TABLET BY MOUTH 3 TIMES A DAY AS NEEDED 09/16/24   Lawrence Nancyann BRAVO, MD  apixaban  (ELIQUIS ) 5 MG TABS tablet Take 1 tablet (5 mg total) by mouth 2 (two) times daily. 10/04/23   Lawrence Nancyann BRAVO, MD  atorvastatin  (LIPITOR ) 80 MG tablet TAKE 1 TABLET BY MOUTH AT BEDTIME 07/15/23   Lawrence Nancyann BRAVO, MD  Blood Glucose Monitoring Suppl (ACCU-CHEK GUIDE) w/Device KIT Use to check blood sugars as directed 12/01/23   Lawrence Nancyann BRAVO, MD  Blood Pressure Monitor DEVI Use to check blood pressure daily 07/27/24   Lawrence Nancyann BRAVO, MD  BREZTRI  AEROSPHERE 160-9-4.8 MCG/ACT AERO inhaler INHALE 2 PUFFS BY MOUTH INTO THE LUNGS 2TIMES DAILY 06/05/24   Lawrence Nancyann BRAVO, MD  cetirizine  (ZYRTEC ) 10 MG tablet Take 1 tablet (10 mg total) by  mouth at bedtime. 03/31/24   Lawrence Nancyann BRAVO, MD  cholecalciferol  (VITAMIN D3) 25 MCG (1000 UNIT) tablet Take 1,000 Units by mouth daily.    [provider]  Cyanocobalamin  (VITAMIN B-12) 1000 MCG SUBL Place 1 tablet under the tongue daily.    [provider]  dapagliflozin  propanediol (FARXIGA ) 10 MG TABS tablet TAKE 1 TABLET BY MOUTH DAILY 03/28/24  Lawrence Nancyann BRAVO, MD  famotidine  (PEPCID ) 20 MG tablet TAKE 1 TABLET BY MOUTH TWICE A DAY 08/23/24   Lawrence Nancyann BRAVO, MD  glucose blood (ACCU-CHEK GUIDE) test strip Use as instructed to check sugar daily for type 2 diabetes. 03/03/21   Lawrence Nancyann BRAVO, MD  iron  polysaccharides (FERREX 150) 150 MG capsule Take one capsul every Monday, Wednesday, and Friday 06/09/24   Lawrence Nancyann BRAVO, MD  isosorbide  mononitrate (IMDUR ) 120 MG 24 hr tablet Take 1 tablet (120 mg total) by mouth daily. Patient not taking: Reported on 08/14/2024 02/10/24 02/09/25  Von Bellis, MD  lithium  carbonate 150 MG capsule Take 150 mg by mouth 2 (two) times daily. Patient not taking: Reported on 08/14/2024 03/24/24   [provider]  metFORMIN  (GLUCOPHAGE -XR) 500 MG 24 hr tablet TAKE 1 TABLET BY MOUTH DAILY WITH EVENING MEAL 07/11/24   Lawrence Nancyann BRAVO, MD  methocarbamol  (ROBAXIN ) 500 MG tablet TAKE 1 TABLET BY MOUTH EVERY 8 HOURS AS NEEDED FOR MUSCLE SPASMS 01/13/24   Lawrence Nancyann BRAVO, MD  metoprolol  succinate (TOPROL -XL) 25 MG 24 hr tablet TAKE 1 TABLET BY MOUTH DAILY 08/18/24   Lawrence Nancyann BRAVO, MD  nitroGLYCERIN  (NITROSTAT ) 0.4 MG SL tablet Place 1 tablet (0.4 mg total) under the tongue every 5 (five) minutes as needed for chest pain (Do not take if blood pressure is low, systolic BP<110). 10/21/23   [name], MD  omega-3 acid ethyl esters (LOVAZA ) 1 g capsule TAKE 4 CAPSULES (4 GRAMS TOTAL) BY MOUTHDAILY 03/02/24   Lawrence Nancyann BRAVO, MD  ondansetron  (ZOFRAN ) 4 MG tablet Take 1 tablet (4 mg total) by mouth every 8 (eight) hours as needed for nausea or  vomiting. Patient not taking: Reported on 09/15/2024 06/23/24   Lawrence Nancyann BRAVO, MD  oxyCODONE -acetaminophen  (PERCOCET) 10-325 MG tablet TAKE 1 TABLET BY MOUTH EVERY 4 HOURS AS NEEDED FOR PAIN 09/16/24   Lawrence Nancyann BRAVO, MD  OXYGEN  Inhale 2 L into the lungs at bedtime as needed (for shortness of breath).    [provider]  pantoprazole  (PROTONIX ) 40 MG tablet TAKE 1 TABLET BY MOUTH 2 TIMES DAILY (30MINUTES BEFORE A MEAL) 01/12/24   Lawrence Nancyann BRAVO, MD  predniSONE  (DELTASONE ) 20 MG tablet Take 1 tablet (20 mg total) by mouth 2 (two) times daily with a meal for 5 days. 10/04/24 10/09/24  Menshew, Candida LULLA Kings, PA-C  ranolazine  (RANEXA ) 1000 MG SR tablet Take 1 tablet (1,000 mg total) by mouth 2 (two) times daily. 08/15/24   Dorinda Drue DASEN, MD  sacubitril -valsartan  (ENTRESTO ) 49-51 MG Take 1 tablet by mouth 2 (two) times daily. 07/27/24   Lawrence Nancyann BRAVO, MD  spironolactone  (ALDACTONE ) 25 MG tablet Take 1 tablet (25 mg total) by mouth daily. 08/12/24   Lawrence Nancyann BRAVO, MD  traZODone  (DESYREL ) 150 MG tablet TAKE 1 TABLET BY MOUTH AT BEDTIME AS NEEDED FOR SLEEP. 09/10/24   Lawrence Nancyann BRAVO, MD    Physical Exam: Vitals:   10/05/24 1746  BP: 121/68  Pulse: (!) 115  Resp: 18  Temp: 98 F (36.7 C)  TempSrc: Oral  SpO2: 100%  Weight: 77.6 kg  Height: 5' 7 (1.702 m)   Physical Exam Vitals and nursing note reviewed.  Constitutional:      General: He is not in acute distress. HENT:     Head: Normocephalic and atraumatic.  Cardiovascular:     Rate and Rhythm: Normal rate and regular rhythm.     Heart sounds: Normal heart sounds.  Pulmonary:     Effort: Pulmonary effort is normal.     Breath sounds: Normal breath sounds.  Abdominal:     Palpations: Abdomen is soft.     Tenderness: There is no abdominal tenderness.  Neurological:     Mental Status: Mental status is at baseline.     Labs on Admission: I have personally reviewed following labs and imaging studies  CBC: Recent  Labs  Lab 10/05/24 1750  WBC 7.4  HGB 16.4  HCT 48.9  MCV 89.7  PLT 189   Basic Metabolic Panel: Recent Labs  Lab 10/05/24 1750  NA 143  K 3.2*  CL 110  CO2 19*  GLUCOSE 77  BUN 19  CREATININE 0.80  CALCIUM  6.4*   GFR: Estimated Creatinine Clearance: 80.3 mL/min (by C-G formula based on SCr of 0.8 mg/dL). Liver Function Tests: No results for input(s): AST, ALT, ALKPHOS, BILITOT, PROT, ALBUMIN in the last 168 hours. No results for input(s): LIPASE, AMYLASE in the last 168 hours. No results for input(s): AMMONIA in the last 168 hours. Coagulation Profile: Recent Labs  Lab 10/05/24 1750  INR 1.3*   Cardiac Enzymes: No results for input(s): CKTOTAL, CKMB, CKMBINDEX, TROPONINI in the last 168 hours. BNP (last 3 results) No results for input(s): PROBNP in the last 8760 hours. HbA1C: No results for input(s): HGBA1C in the last 72 hours. CBG: No results for input(s): GLUCAP in the last 168 hours. Lipid Profile: No results for input(s): CHOL, HDL, LDLCALC, TRIG, CHOLHDL, LDLDIRECT in the last 72 hours. Thyroid  Function Tests: No results for input(s): TSH, T4TOTAL, FREET4, T3FREE, THYROIDAB in the last 72 hours. Anemia Panel: No results for input(s): VITAMINB12, FOLATE, FERRITIN, TIBC, IRON , RETICCTPCT in the last 72 hours. Urine analysis:    Component Value Date/Time   COLORURINE YELLOW (A) 09/09/2024 2133   APPEARANCEUR CLEAR (A) 09/09/2024 2133   APPEARANCEUR Clear 05/25/2019 1554   LABSPEC 1.033 (H) 09/09/2024 2133   LABSPEC 1.012 03/07/2015 2143   PHURINE 6.0 09/09/2024 2133   GLUCOSEU >=500 (A) 09/09/2024 2133   GLUCOSEU Negative 03/07/2015 2143   HGBUR NEGATIVE 09/09/2024 2133   BILIRUBINUR NEGATIVE 09/09/2024 2133   BILIRUBINUR Negative 05/25/2019 1554   BILIRUBINUR Negative 03/07/2015 2143   KETONESUR NEGATIVE 09/09/2024 2133   PROTEINUR NEGATIVE 09/09/2024 2133   UROBILINOGEN 0.2  03/26/2017 0907   NITRITE NEGATIVE 09/09/2024 2133   LEUKOCYTESUR NEGATIVE 09/09/2024 2133   LEUKOCYTESUR Negative 03/07/2015 2143    Radiological Exams on Admission: DG Chest 2 View Result Date: 10/05/2024 CLINICAL DATA:  Chest pain and weakness. EXAM: CHEST - 2 VIEW COMPARISON:  September 09, 2024 FINDINGS: Multiple sternal wires and vascular clips are seen. The heart size and mediastinal contours are within normal limits. A coronary artery stent is in place. There is marked severity calcification of the aortic arch. Both lungs are clear. The visualized skeletal structures are unremarkable. IMPRESSION: 1. Evidence of prior median sternotomy/CABG. 2. No active cardiopulmonary disease. Electronically Signed   By: Suzen Dials M.D.   On: 10/05/2024 18:32   Data Reviewed for HPI: Relevant notes from primary care and specialist visits, past discharge summaries as available in EHR, including Care Everywhere. Prior diagnostic testing as pertinent to current admission diagnoses Updated medications and problem lists for reconciliation ED course, including vitals, labs, imaging, treatment and response to treatment Triage notes, nursing and pharmacy notes and ED provider's notes Notable results as noted above in HPI      Assessment and Plan: * Substernal precordial  chest pain Elevated troponin History of CAD s/p PCI x 4 followed by CABG 2016 History of chronic chest pain Patient is at high risk for ischemic chest pain, albeit with underlying chronic chest pain, possible chest pain related to coughing Recently at [facility] with complaints of chest pain but troponin was normal First troponin 28 and EKG nonischemic D-dimer negative Continue to trend troponin Continue home GDMT-metoprolol , Imdur , Ranexa , atorvastatin , apixaban  Cardiology consult for additional recommendations   COPD with acute exacerbation (HCC) Chronic respiratory failure with hypoxia OSA, CPAP intolerant Scheduled and as  needed DuoNebs IV steroids Antitussives, flutter valve Continue supplemental oxygen   Hypocalcemia Calcium  was 6.4 in the ED, etiology uncertain Received calcium  gluconate in the ED Will check mag and albumin Continue to monitor and replete  Hypotension History of hypertension Addendum:-Following admission patient became hypotensive with BP as low as 77/54 - Receiving NS boluses as needed - Will give a dose of midodrine  - Patient reassessed at bedside and clinically appears well without complaints -Continue to monitor closely - Hold antihypertensives    10/06/2024    4:41 AM 10/06/2024    4:40 AM 10/06/2024    3:21 AM  Vitals with BMI  Systolic 77 80 76  Diastolic 54 61 57  Pulse 78  41     Atrial fibrillation, chronic (HCC) Mildly elevated rate likely due to to COPD exacerbation Continue apixaban  and metoprolol   Chronic HFrEF (heart failure with reduced ejection fraction) (HCC) Clinically euvolemic Holding GDMT due to hypotension developing following admission, Toprol , Entresto , Aldactone , Farxiga , Imdur   Type 2 diabetes mellitus without complications (HCC) Sliding scale insulin  coverage  PAD (peripheral artery disease) Continue atorvastatin  and apixaban   Depression with anxiety Continue trazodone  and alprazolam     DVT prophylaxis: Apixaban   Consults: [facility] cardiology  Advance Care Planning:   Code Status: Prior   Family Communication: none  Disposition Plan: Back to previous home environment  Severity of Illness: The appropriate patient status for this patient is OBSERVATION. Observation status is judged to be reasonable and necessary in order to provide the required intensity of service to ensure the patient's safety. The patient's presenting symptoms, physical exam findings, and initial radiographic and laboratory data in the context of their medical condition is felt to place them at decreased risk for further clinical deterioration. Furthermore, it is  anticipated that the patient will be medically stable for discharge from the hospital within 2 midnights of admission.  CRITICAL CARE Performed by: Delayne LULLA Solian   Total critical care time: 60 minutes  Critical care time was exclusive of separately billable procedures and treating other patients.  Critical care was necessary to treat or prevent imminent or life-threatening deterioration.  Critical care was time spent personally by me on the following activities: development of treatment plan with patient and/or surrogate as well as nursing, discussions with consultants, evaluation of patient's response to treatment, examination of patient, obtaining history from patient or surrogate, ordering and performing treatments and interventions, ordering and review of laboratory studies, ordering and review of radiographic studies, pulse oximetry and re-evaluation of patient's condition.  Author: Delayne LULLA Solian, MD 10/05/2024 8:48 PM  For on call review [email or web address].

## 2024-10-05 NOTE — Assessment & Plan Note (Addendum)
 Clinically euvolemic Holding GDMT due to hypotension developing following admission, Toprol , Entresto , Aldactone , Farxiga , Imdur 

## 2024-10-05 NOTE — Assessment & Plan Note (Signed)
 Continue trazodone  and alprazolam 

## 2024-10-05 NOTE — Assessment & Plan Note (Signed)
 Mildly elevated rate likely due to to COPD exacerbation Continue apixaban  and metoprolol 

## 2024-10-05 NOTE — ED Provider Notes (Signed)
 [facility] Provider Note    Event Date/Time   First MD Initiated Contact with Patient 10/05/24 1830     (approximate)   History   Shortness of Breath   HPI  Lawrence [name] is a 68 y.o. male multiple chronic diseases including A-fib, coronary disease COPD.  Patient reports uses 2 L oxygen  only at night but not during the day  Has been having about a day and a half of cough slight achiness or tightness in the anterior chest.  No fever.  Feels like he might be starting to get a little bit of COPD has been using inhaler/nebulizer at home without relief.  Seen in the ER a couple days ago for leg pain reports that has gotten better.  He has not yet filled the prednisone  prescription he was given      Past Medical History:  Diagnosis Date   A-fib (HCC)    Anemia    Anginal pain    Anxiety    Arthritis    Asthma    Atrial fibrillation, chronic (HCC) 10/19/2023   CAD (coronary artery disease)    a. 2002 CABGx2 (LIMA->LAD, VG->VG->OM1);  b. 09/2012 DES->OM;  c. 03/2015 PTCA of LAD [facility]) in setting of atretic LIMA; d. 05/2015 Cath [facility]): nonobs dzs; e. 06/2015 Cath ([facility]): LM nl, LAD 45p/d ISR, 50d, D1/2 small, LCX 50p/d ISR, OM1 70ost, 30 ISR, VG->OM1 50ost, 57m, LIMA->LAD 99p/d - atretic, RCA dom, nl; f.cath 10/16: 40-50%(FFR 0.90) pLAD, 75% (FFR 0.77) mLAD s/p PCI/DES, oRCA 40% (FFR0.95)   Cancer (HCC)    SKIN CANCER ON BACK   Celiac disease    Chronic diastolic CHF (congestive heart failure) (HCC)    a. 06/2009 Echo: EF 60-65%, Gr 1 DD, triv AI, mildly dil LA, nl RV.   COPD (chronic obstructive pulmonary disease) (HCC)    a. Chronic bronchitis and emphysema.   DDD (degenerative disc disease), lumbar    Diverticulosis    Dysrhythmia    Essential hypertension    GERD (gastroesophageal reflux disease)    History of hiatal hernia    History of kidney stones    H/O   History of tobacco abuse    a. Quit 2014.   Myocardial infarction [facility]) 2002   4  STENTS   Pancreatitis    PSVT (paroxysmal supraventricular tachycardia)    a. 10/2012 Noted on Zio Patch.   RLL pneumonia 05/30/2024   Sleep apnea    LOST CORD TO CPAP -ONLY 02 @ BEDTIME   Tubular adenoma of colon    Type II diabetes mellitus [facility])     Physical Exam   Triage Vital Signs: ED Triage Vitals [10/05/24 1746]  Encounter Vitals Group     BP 121/68     Girls Systolic BP Percentile      Girls Diastolic BP Percentile      Boys Systolic BP Percentile      Boys Diastolic BP Percentile      Pulse Rate (!) 115     Resp 18     Temp 98 F (36.7 C)     Temp Source Oral     SpO2 100 %     Weight 171 lb (77.6 kg)     Height 5' 7 (1.702 m)     Head Circumference      Peak Flow      Pain Score 8     Pain Loc      Pain Education  Exclude from Growth Chart     Most recent vital signs: Vitals:   10/05/24 1945 10/05/24 2030  BP: 116/83 125/74  Pulse: 75 93  Resp:  15  Temp:    SpO2: 99% 95%     General: Awake, no distress.  Does appear just slightly dyspneic and barrel chested though.  I am able to help him from wheelchair over to bed without difficulty CV:  Good peripheral perfusion.  Slight irregularity minimally elevated rate. Resp:  Normal effort.  Barrel chested.  Moderate expiratory wheezing occasional cough but overall his work of breathing appears normal Abd:  No distention.  Soft nontender Other:  No noted unilateral edema venous cords or congestion   ED Results / Procedures / Treatments   Labs (all labs ordered are listed, but only abnormal results are displayed) Labs Reviewed  BASIC METABOLIC PANEL WITH GFR - Abnormal; Notable for the following components:      Result Value   Potassium 3.2 (*)    CO2 19 (*)    Calcium  6.4 (*)    All other components within normal limits  PROTIME-INR - Abnormal; Notable for the following components:   Prothrombin Time 17.2 (*)    INR 1.3 (*)    All other components within normal limits  TROPONIN T, HIGH  SENSITIVITY - Abnormal; Notable for the following components:   Troponin T High Sensitivity 28 (*)    All other components within normal limits  TROPONIN T, HIGH SENSITIVITY - Abnormal; Notable for the following components:   Troponin T High Sensitivity 27 (*)    All other components within normal limits  RESP PANEL BY RT-PCR (RSV, FLU A&B, COVID)  RVPGX2  CBC  D-DIMER, QUANTITATIVE  CALCIUM , IONIZED  HIV ANTIBODY (ROUTINE TESTING W REFLEX)     EKG  Interpreted by me at 1800 heart rate 110 QRS 90 QTc 400 atrial fibrillation, slight RVR.  Artifact present.  No obvious acute STEMI   RADIOLOGY  Chest x-ray inter by me is negative for acute  DG Chest 2 View Result Date: 10/05/2024 CLINICAL DATA:  Chest pain and weakness. EXAM: CHEST - 2 VIEW COMPARISON:  September 09, 2024 FINDINGS: Multiple sternal wires and vascular clips are seen. The heart size and mediastinal contours are within normal limits. A coronary artery stent is in place. There is marked severity calcification of the aortic arch. Both lungs are clear. The visualized skeletal structures are unremarkable. IMPRESSION: 1. Evidence of prior median sternotomy/CABG. 2. No active cardiopulmonary disease. Electronically Signed   By: Suzen Dials M.D.   On: 10/05/2024 18:32         PROCEDURES:  Critical Care performed: No  Procedures   MEDICATIONS ORDERED IN ED: Medications  allopurinol  (ZYLOPRIM ) tablet 300 mg (has no administration in time range)  atorvastatin  (LIPITOR ) tablet 80 mg (has no administration in time range)  isosorbide  mononitrate (IMDUR ) 24 hr tablet 120 mg (has no administration in time range)  nitroGLYCERIN  (NITROSTAT ) SL tablet 0.4 mg (has no administration in time range)  omega-3 acid ethyl esters (LOVAZA ) capsule 2 g (has no administration in time range)  ranolazine  (RANEXA ) 12 hr tablet 1,000 mg (has no administration in time range)  sacubitril -valsartan  (ENTRESTO ) 49-51 mg per tablet (has no  administration in time range)  spironolactone  (ALDACTONE ) tablet 25 mg (has no administration in time range)  ALPRAZolam  (XANAX ) tablet 1 mg (has no administration in time range)  traZODone  (DESYREL ) tablet 150 mg (has no administration in time range)  dapagliflozin   propanediol (FARXIGA ) tablet 10 mg (has no administration in time range)  famotidine  (PEPCID ) tablet 20 mg (has no administration in time range)  pantoprazole  (PROTONIX ) EC tablet 40 mg (has no administration in time range)  apixaban  (ELIQUIS ) tablet 5 mg (has no administration in time range)  methocarbamol  (ROBAXIN ) tablet 500 mg (has no administration in time range)  acetaminophen  (TYLENOL ) tablet 650 mg (has no administration in time range)  ondansetron  (ZOFRAN ) injection 4 mg (has no administration in time range)  insulin  aspart (novoLOG ) injection 0-15 Units (has no administration in time range)  insulin  aspart (novoLOG ) injection 0-5 Units (has no administration in time range)  methylPREDNISolone  sodium succinate (SOLU-MEDROL ) 40 mg/mL injection 40 mg (has no administration in time range)    Followed by  predniSONE  (DELTASONE ) tablet 40 mg (has no administration in time range)  guaiFENesin  (MUCINEX ) 12 hr tablet 1,200 mg (has no administration in time range)  levalbuterol  (XOPENEX ) nebulizer solution 1.25 mg (has no administration in time range)  oxyCODONE -acetaminophen  (PERCOCET/ROXICET) 5-325 MG per tablet 1 tablet (has no administration in time range)    And  oxyCODONE  (Oxy IR/ROXICODONE ) immediate release tablet 5 mg (has no administration in time range)  levalbuterol  (XOPENEX ) nebulizer solution 1.25 mg (has no administration in time range)  levalbuterol  (XOPENEX ) nebulizer solution 1.25 mg (1.25 mg Nebulization Given 10/05/24 1908)  ipratropium (ATROVENT ) nebulizer solution 0.5 mg (0.5 mg Nebulization Given 10/05/24 1908)  predniSONE  (DELTASONE ) tablet 60 mg (60 mg Oral Given 10/05/24 1908)  azithromycin  (ZITHROMAX )  tablet 500 mg (500 mg Oral Given 10/05/24 1908)  calcium  gluconate 1 g/ 50 mL sodium chloride  IVPB (1,000 mg Intravenous New Bag/Given 10/05/24 2013)  aspirin  chewable tablet 324 mg (324 mg Oral Given 10/05/24 2012)     IMPRESSION / MDM / ASSESSMENT AND PLAN / ED COURSE  I reviewed the triage vital signs and the nursing notes.                              Differential diagnosis includes, but is not limited to, COPD exacerbation as he is wheezing cough findings consistent with same, less likely based on exam volume overload, CHF, pneumonia, viral syndrome upper respiratory infection, ACS etc.  Surprisingly his initial troponin is very slightly elevated suspect likely demand.  He does have wheezing.  Treating with nebulizer steroids and azithromycin .  He has slight RVR I suspect this is likely in the setting of increased metabolic need and demand due to underlying COPD which I suspect has exacerbation.  He is low risk for thromboembolism as he is currently anticoagulated making PE unlikely.  Will check D-dimer pending at time of admission decision.  Dr. Cleatus aware that this will need to be followed up as the patient is ready to move to the floor now  Given aspirin .  Given his elevated troponin plan to admit for treatment of COPD and observation overnight/cardiac rule out.  Patient's presentation is most consistent with acute presentation with potential threat to life or bodily function.    The patient is on the cardiac monitor to evaluate for evidence of arrhythmia and/or significant heart rate changes.  Clinical Course as of 10/05/24 2126  Thu Oct 05, 2024  1957 Significant low hypocalcemia.  Will check ionized calcium .  Patient has no symptoms that would be suggestive of acute symptomatic hypocalcemia. [MQ]  1957 There is no notable muscle weakness spasms cramps or palpitations.  No abdominal pain [MQ]  2052 Consulted  with patient admitted to the care of Dr. Cleatus in the setting of  chest pain with elevated troponin.  Traditionally does not have elevation in his troponin, and at this juncture think the patient would benefit from observation for chest pain rule out and treatment of COPD [MQ]  2122 Patient agreeable with understand plan for admission.  Reexam still has mild wheezing expiratory. [MQ]    Clinical Course User Index [MQ] Dicky Anes, MD     FINAL CLINICAL IMPRESSION(S) / ED DIAGNOSES   Final diagnoses:  COPD exacerbation (HCC)  Elevated troponin  Atypical chest pain  Moderate risk chest pain     Rx / DC Orders   ED Discharge Orders     None        Note:  This document was prepared using Dragon voice recognition software and may include unintentional dictation errors.   Dicky Anes, MD 10/05/24 2126

## 2024-10-05 NOTE — Assessment & Plan Note (Signed)
-   Continue home meds

## 2024-10-05 NOTE — Assessment & Plan Note (Addendum)
 Chronic respiratory failure with hypoxia OSA, CPAP intolerant Scheduled and as needed DuoNebs IV steroids Antitussives, flutter valve Continue supplemental oxygen 

## 2024-10-05 NOTE — ED Triage Notes (Signed)
 Pt presents to the ED via [facility] from home. Pt reports SHOB x2 days. Reports cough, congestion, and loss of appetite. Pt reports CP. Pt has hx of afib.  EMS gave Duo neb + albueterol neb and 125mg  solumedrol HR112 149/70 CBG 85

## 2024-10-05 NOTE — Assessment & Plan Note (Addendum)
 Elevated troponin History of CAD s/p PCI x 4 followed by CABG 2016 History of chronic chest pain Patient is at high risk for ischemic chest pain, albeit with underlying chronic chest pain, possible chest pain related to coughing Recently at [facility] with complaints of chest pain but troponin was normal First troponin 28 and EKG nonischemic D-dimer negative Continue to trend troponin Continue home GDMT-metoprolol , Imdur , Ranexa , atorvastatin , apixaban  Cardiology consult for additional recommendations

## 2024-10-05 NOTE — Assessment & Plan Note (Signed)
 Sliding scale insulin  coverage

## 2024-10-05 NOTE — Assessment & Plan Note (Signed)
Continue atorvastatin and apixaban

## 2024-10-05 NOTE — Telephone Encounter (Signed)
 Refill due 12-1. Will send in refill end of next week.

## 2024-10-06 ENCOUNTER — Other Ambulatory Visit: Payer: Self-pay

## 2024-10-06 ENCOUNTER — Other Ambulatory Visit: Payer: Self-pay | Admitting: Family Medicine

## 2024-10-06 DIAGNOSIS — I2510 Atherosclerotic heart disease of native coronary artery without angina pectoris: Secondary | ICD-10-CM | POA: Diagnosis not present

## 2024-10-06 DIAGNOSIS — R072 Precordial pain: Secondary | ICD-10-CM

## 2024-10-06 DIAGNOSIS — I255 Ischemic cardiomyopathy: Secondary | ICD-10-CM | POA: Diagnosis not present

## 2024-10-06 DIAGNOSIS — I4891 Unspecified atrial fibrillation: Secondary | ICD-10-CM | POA: Diagnosis not present

## 2024-10-06 DIAGNOSIS — R079 Chest pain, unspecified: Secondary | ICD-10-CM | POA: Diagnosis not present

## 2024-10-06 DIAGNOSIS — I482 Chronic atrial fibrillation, unspecified: Secondary | ICD-10-CM | POA: Diagnosis not present

## 2024-10-06 DIAGNOSIS — E43 Unspecified severe protein-calorie malnutrition: Secondary | ICD-10-CM | POA: Insufficient documentation

## 2024-10-06 DIAGNOSIS — I25810 Atherosclerosis of coronary artery bypass graft(s) without angina pectoris: Secondary | ICD-10-CM | POA: Diagnosis not present

## 2024-10-06 DIAGNOSIS — R0789 Other chest pain: Secondary | ICD-10-CM | POA: Diagnosis not present

## 2024-10-06 DIAGNOSIS — I5020 Unspecified systolic (congestive) heart failure: Secondary | ICD-10-CM | POA: Diagnosis not present

## 2024-10-06 LAB — GLUCOSE, CAPILLARY
Glucose-Capillary: 280 mg/dL — ABNORMAL HIGH (ref 70–99)
Glucose-Capillary: 218 mg/dL — ABNORMAL HIGH (ref 70–99)
Glucose-Capillary: 205 mg/dL — ABNORMAL HIGH (ref 70–99)
Glucose-Capillary: 145 mg/dL — ABNORMAL HIGH (ref 70–99)

## 2024-10-06 LAB — COMPREHENSIVE METABOLIC PANEL WITH GFR
ALT: 10 U/L (ref 0–44)
AST: 17 U/L (ref 15–41)
Albumin: 4.1 g/dL (ref 3.5–5.0)
Alkaline Phosphatase: 71 U/L (ref 38–126)
Anion gap: 18 — ABNORMAL HIGH (ref 5–15)
BUN: 33 mg/dL — ABNORMAL HIGH (ref 8–23)
CO2: 22 mmol/L (ref 22–32)
Calcium: 9.5 mg/dL (ref 8.9–10.3)
Chloride: 94 mmol/L — ABNORMAL LOW (ref 98–111)
Creatinine, Ser: 1.72 mg/dL — ABNORMAL HIGH (ref 0.61–1.24)
GFR, Estimated: 42 mL/min — ABNORMAL LOW (ref >60)
Glucose, Bld: 376 mg/dL — ABNORMAL HIGH (ref 70–99)
Potassium: 4.5 mmol/L (ref 3.5–5.1)
Sodium: 134 mmol/L — ABNORMAL LOW (ref 135–145)
Total Bilirubin: 0.7 mg/dL (ref 0.0–1.2)
Total Protein: 6.5 g/dL (ref 6.5–8.1)

## 2024-10-06 LAB — HIV ANTIBODY (ROUTINE TESTING W REFLEX): HIV Screen 4th Generation wRfx: NONREACTIVE

## 2024-10-06 LAB — MAGNESIUM: Magnesium: 2.3 mg/dL (ref 1.7–2.4)

## 2024-10-06 MED ORDER — HEPARIN (PORCINE) 25000 UT/250ML-% IV SOLN
900.0000 [IU]/h | INTRAVENOUS | Status: DC
  Administered 2024-10-06: 1050 [IU]/h via INTRAVENOUS
  Administered 2024-10-08 – 2024-10-09 (×2): 1000 [IU]/h via INTRAVENOUS
  Filled 2024-10-06 (×3): qty 250

## 2024-10-06 MED ORDER — SODIUM CHLORIDE 0.9 % IV BOLUS
500.0000 mL | Freq: Once | INTRAVENOUS | Status: AC
  Administered 2024-10-06: 500 mL via INTRAVENOUS

## 2024-10-06 MED ORDER — SODIUM CHLORIDE 0.9 % IV SOLN: INTRAVENOUS | Status: AC

## 2024-10-06 MED ORDER — ADULT MULTIVITAMIN W/MINERALS CH
1.0000 | ORAL_TABLET | Freq: Every day | ORAL | Status: DC
  Administered 2024-10-06 – 2024-10-09 (×4): 1 via ORAL
  Filled 2024-10-06 (×4): qty 1

## 2024-10-06 MED ORDER — ORAL CARE MOUTH RINSE: 15.0000 mL | OROMUCOSAL | Status: DC | PRN

## 2024-10-06 MED ORDER — ENSURE PLUS HIGH PROTEIN PO LIQD
237.0000 mL | Freq: Two times a day (BID) | ORAL | Status: DC
  Administered 2024-10-06 – 2024-10-09 (×7): 237 mL via ORAL

## 2024-10-06 MED ORDER — MIDODRINE HCL 5 MG PO TABS
10.0000 mg | ORAL_TABLET | Freq: Once | ORAL | Status: AC
  Administered 2024-10-06: 10 mg via ORAL
  Filled 2024-10-06: qty 2

## 2024-10-06 NOTE — Assessment & Plan Note (Addendum)
 History of hypertension Addendum:-Following admission patient became hypotensive with BP as low as 77/54 - Receiving NS boluses as needed - Will give a dose of midodrine  - Patient reassessed at bedside and clinically appears well without complaints -Continue to monitor closely - Hold antihypertensives    10/06/2024    4:41 AM 10/06/2024    4:40 AM 10/06/2024    3:21 AM  Vitals with BMI  Systolic 77 80 76  Diastolic 54 61 57  Pulse 78  41

## 2024-10-06 NOTE — Progress Notes (Signed)
 Progress Note   Patient: Lawrence [name] FMW:[id number] DOB: [birth year] DOA: 10/05/2024     0 DOS: the patient was seen and examined on 10/06/2024   Brief hospital course:  From HPI Lawrence [name] is a 68 y.o. male with medical history significant for CAD (s/p of stent x 4/CABG 2016), A-fib on Eliquis , HFrEF (EF 35 to 40%, 07/2024), PSVT/frequent PVCs, HTN,DM, COPD on 2L O2, OSA not using CPAP, multiple prior hospitalizations related to chest pain/CHF/COPD, most recently 6 weeks ago (10/5 to 08/22/2024, [facility]), being admitted with chest pain and elevated troponin in the setting of a COPD exacerbation.  Patient describes a several day history of cough and shortness of breath associated with chest pressure which did not resolve with home breathing treatments.  He has had no fever and chills.  Denies leg pain or swelling.  Due to worsening chest pain he presented to the hospital.  Of note, during his recent admission to [facility], he did have chest pain and was evaluated by cardiology. They believed it chronic and attributed to an upper respiratory tract infection . Troponin was normal. Ischemic workup was not indicated per cardiology.SABRA  His symptoms improved with oxycodone . In the ED, he was mildly tachycardic to 115 with otherwise normal vitals, saturating at 100% on his home flow rate of oxygen  at 2 L. Labs notable for first troponin of 28 CBC normal.  BMP notable for potassium 3.2, bicarb 19 and calcium  6.4. Respiratory viral panel negative EKG showed A-fib at 107 with no other concerning findings suggestive of ischemia. Chest x-ray clear   Patient was treated with Xopenex  (albuterol  intolerant) as well as prednisone  and azithromycin .  He was given a chewable aspirin .  Also given calcium  repletion in the form of calcium  gluconate.   Admission requested high risk chest pain rule out and COPD exacerbation      Assessment and Plan: * Substernal precordial chest pain Elevated troponin History of CAD  s/p PCI x 4 followed by CABG 2016 History of chronic chest pain Still has some chest pain Recently at [facility] with complaints of chest pain but troponin was normal First troponin 28 and EKG nonischemic D-dimer negative Continue to trend troponin Continue home GDMT-metoprolol , Imdur , Ranexa , atorvastatin , apixaban  Cardiology consulted and case discussed     COPD with acute exacerbation (HCC) Chronic respiratory failure with hypoxia OSA, CPAP intolerant Scheduled and as needed DuoNebs Continue steroid therapy Continue antitussives, flutter valve Continue supplemental oxygen    Hypocalcemia Calcium  was 6.4 in the ED, etiology uncertain Received calcium  gluconate in the ED Will check mag and albumin Continue to monitor and replete   Hypotension History of hypertension Continue to hold antihypertensives given relative hypotension    Atrial fibrillation, chronic (HCC) Mildly elevated rate likely due to to COPD exacerbation Continue apixaban  and metoprolol    Chronic HFrEF (heart failure with reduced ejection fraction) (HCC) Clinically euvolemic Holding GDMT due to hypotension developing following admission, Toprol , Entresto , Aldactone , Farxiga , Imdur    Type 2 diabetes mellitus without complications (HCC) Sliding scale insulin  coverage   PAD (peripheral artery disease) Continue atorvastatin  and apixaban    Depression with anxiety Continue trazodone  and alprazolam     DVT prophylaxis: Apixaban    Consults: [facility] cardiology   Advance Care Planning:   Code Status: Prior    Family Communication: none   Disposition Plan: Back to previous home environment  Subjective:  Patient seen and examined at bedside this morning Denies nausea vomiting Still has some chest pain I discussed with cardiologist Dr.  Marius Bathe who will continue to see patient closely  Physical Exam: Vitals and nursing note reviewed.  Constitutional:      General: He is not in acute distress. HENT:     Head:  Normocephalic and atraumatic.  Cardiovascular:     Rate and Rhythm: Normal rate and regular rhythm.     Heart sounds: Normal heart sounds.  Pulmonary:     Effort: Pulmonary effort is normal.     Breath sounds: Normal breath sounds.  Abdominal:     Palpations: Abdomen is soft.     Tenderness: There is no abdominal tenderness.  Neurological:     Mental Status: Mental status is at baseline.   Vitals:   10/06/24 0543 10/06/24 0800 10/06/24 1135 10/06/24 1527  BP: (!) 88/62 91/61 93/62  (!) 88/64  Pulse: 71 (!) 103 95 93  Resp: 16  18 18   Temp:  98.2 F (36.8 C) 97.6 F (36.4 C) 97.6 F (36.4 C)  TempSrc:  Axillary    SpO2: 95% 96% 96% 94%  Weight:      Height:        Data Reviewed:     Latest Ref Rng & Units 10/05/2024    5:50 PM 09/09/2024    7:37 PM 08/16/2024    4:23 PM  CBC  WBC 4.0 - 10.5 K/uL 7.4  5.9  7.9   Hemoglobin 13.0 - 17.0 g/dL 83.5  83.2  83.9   Hematocrit 39.0 - 52.0 % 48.9  50.7  49.7   Platelets 150 - 400 K/uL 189  189  147        Latest Ref Rng & Units 10/06/2024    4:11 AM 10/05/2024    5:50 PM 09/09/2024    7:37 PM  BMP  Glucose 70 - 99 mg/dL 623  77  854   BUN 8 - 23 mg/dL 33  19  17   Creatinine 0.61 - 1.24 mg/dL 8.27  9.19  8.90   Sodium 135 - 145 mmol/L 134  143  137   Potassium 3.5 - 5.1 mmol/L 4.5  3.2  4.0   Chloride 98 - 111 mmol/L 94  110  103   CO2 22 - 32 mmol/L 22  19  27    Calcium  8.9 - 10.3 mg/dL 9.5  6.4  9.2     Family Communication: No family at bedside  Disposition: Status is: Inpatient  Time spent: 51 minutes  Author: Drue ONEIDA Potter, MD 10/06/2024 4:06 PM  For on call review [email or web address].

## 2024-10-06 NOTE — Assessment & Plan Note (Signed)
 Calcium  was 6.4 in the ED, etiology uncertain Received calcium  gluconate in the ED Will check mag and albumin Continue to monitor and replete

## 2024-10-06 NOTE — Evaluation (Signed)
 Physical Therapy Evaluation Patient Details Name: Lawrence [name] MRN: [id number] DOB: [birth year] Today's Date: 10/06/2024  History of Present Illness  68 y/o male presented to ED on 10/05/24 for SOB x 2 days along with cough and congestion. Admitted for substernal precordial chest pain and elevated troponin. PMH: CAD (s/p of stent x 4 and CBAG 2016), sCHF with EF 30-35%, a fib on Eliquis , PSVT,  HTN, HLD, DM, COPD on 2L O2, OSA not using CPAP, dCHF, anxiety, celiac disease, pancreatitis.  Clinical Impression  Patient admitted with the above. PTA, patient lives with grandson who manages iADLs. Patient is typically ambulatory with rollator at baseline with 2L O2 PRN during the day but wears it at night. Patient presents with weakness, impaired balance, and decreased activity tolerance. Able to stand from recliner with CGA. Ambulated ~50' with RW and CGA with patient reporting lightheadedness at ~25' and returned to recliner. Patient will benefit from skilled PT services during acute stay to address listed deficits. Patient will benefit from ongoing therapy at discharge to maximize functional independence and safety.         If plan is discharge home, recommend the following: A little help with walking and/or transfers;A little help with bathing/dressing/bathroom;Assistance with cooking/housework;Assist for transportation;Help with stairs or ramp for entrance   Can travel by private vehicle        Equipment Recommendations Rolling Dat Derksen (2 wheels)  Recommendations for Other Services       Functional Status Assessment Patient has had a recent decline in their functional status and demonstrates the ability to make significant improvements in function in a reasonable and predictable amount of time.     Precautions / Restrictions Precautions Precautions: Fall Recall of Precautions/Restrictions: Intact Restrictions Weight Bearing Restrictions Per Provider Order: No      Mobility  Bed  Mobility                    Transfers Overall transfer level: Needs assistance Equipment used: Rolling Bernhard Koskinen (2 wheels) Transfers: Sit to/from Stand Sit to Stand: Contact guard assist                Ambulation/Gait Ambulation/Gait assistance: Contact guard assist Gait Distance (Feet): 50 Feet Assistive device: Rolling Tino Ronan (2 wheels) Gait Pattern/deviations: Step-through pattern, Decreased stride length Gait velocity: decreased     General Gait Details: CGA for safety. Patient reporting feeling lightheaded after short ambulation. Returned to Cabin Crew Bed    Modified Rankin (Stroke Patients Only)       Balance Overall balance assessment: Needs assistance Sitting-balance support: No upper extremity supported, Feet unsupported Sitting balance-Leahy Scale: Normal     Standing balance support: Reliant on assistive device for balance Standing balance-Leahy Scale: Fair                               Pertinent Vitals/Pain Pain Assessment Pain Assessment: Faces Faces Pain Scale: Hurts little more Pain Location: generalized Pain Descriptors / Indicators: Discomfort Pain Intervention(s): Limited activity within patient's tolerance, Monitored during session, Repositioned    Home Living Family/patient expects to be discharged to:: Private residence Living Arrangements: Other relatives Available Help at Discharge: Family;Available 24 hours/day Type of Home: Apartment Home Access: Level entry;Ramped entrance       Home Layout: One level Home Equipment: Shower seat - built in;Rollator (4  wheels);BSC/3in1;Hospital bed;Grab bars - toilet;Grab bars - tub/shower      Prior Function Prior Level of Function : Independent/Modified Independent;History of Falls (last six months)             Mobility Comments: Modified independent ambulating with rollator.  2 L O2 at night (PRN during the  day) ADLs Comments: Pt endorses being Ind with self care tasks but grandson is available to assist. [name] performs most of IADLs.     Extremity/Trunk Assessment   Upper Extremity Assessment Upper Extremity Assessment: Defer to OT evaluation    Lower Extremity Assessment Lower Extremity Assessment: Generalized weakness       Communication   Communication Communication: No apparent difficulties    Cognition Arousal: Alert Behavior During Therapy: WFL for tasks assessed/performed   PT - Cognitive impairments: No apparent impairments                         Following commands: Intact       Cueing Cueing Techniques: Verbal cues     General Comments General comments (skin integrity, edema, etc.): uses 2L during the day PRN for SOB, 97% during OT eval on RA    Exercises     Assessment/Plan    PT Assessment Patient needs continued PT services  PT Problem List Decreased strength;Decreased activity tolerance;Decreased balance;Decreased mobility;Cardiopulmonary status limiting activity       PT Treatment Interventions DME instruction;Gait training;Functional mobility training;Therapeutic activities;Therapeutic exercise;Balance training;Neuromuscular re-education;Patient/family education    PT Goals (Current goals can be found in the Care Plan section)  Acute Rehab PT Goals Patient Stated Goal: to get stronger PT Goal Formulation: With patient Time For Goal Achievement: 10/20/24 Potential to Achieve Goals: Good    Frequency Min 2X/week     Co-evaluation               AM-PAC PT 6 Clicks Mobility  Outcome Measure Help needed turning from your back to your side while in a flat bed without using bedrails?: A Little Help needed moving from lying on your back to sitting on the side of a flat bed without using bedrails?: A Little Help needed moving to and from a bed to a chair (including a wheelchair)?: A Little Help needed standing up from a chair  using your arms (e.g., wheelchair or bedside chair)?: A Little Help needed to walk in hospital room?: A Little Help needed climbing 3-5 steps with a railing? : A Little 6 Click Score: 18    End of Session   Activity Tolerance: Other (comment) (limited by lightheadedness) Patient left: in chair;with call bell/phone within reach;with nursing/sitter in room Nurse Communication: Mobility status PT Visit Diagnosis: Muscle weakness (generalized) (M62.81);Unsteadiness on feet (R26.81)    Time: 8941-8891 PT Time Calculation (min) (ACUTE ONLY): 10 min   Charges:   PT Evaluation $PT Eval Low Complexity: 1 Low   PT General Charges $$ ACUTE PT VISIT: 1 Visit         Maryanne Finder, PT, DPT Physical Therapist - [place]  [facility]   Joseline Mccampbell A Elea Holtzclaw 10/06/2024, 1:35 PM

## 2024-10-06 NOTE — Discharge Instructions (Addendum)
 Nutrition Post Hospital Stay Proper nutrition can help your body recover from illness and injury.   Foods and beverages high in protein, vitamins, and minerals help rebuild muscle loss, promote healing, & reduce fall risk.   In addition to eating healthy foods, a nutrition shake is an easy, delicious way to get the nutrition you need during and after your hospital stay  It is recommended that you continue to drink 2 bottles per day of:       Ensure Plus for at least 1 month (30 days) after your hospital stay   Tips for adding a nutrition shake into your routine: As allowed, drink one with vitamins or medications instead of water or juice Enjoy one as a tasty mid-morning or afternoon snack Drink cold or make a milkshake out of it Drink one instead of milk with cereal or snacks Use as a coffee creamer   Available at the following grocery stores and pharmacies:           * [place] [facility] * [employer] * Costco  * Rite Aid          * Walmart * Sam's Club  * Walgreens      * Target  * BJ's   * CVS  * Lowes Foods   * [facility] [phone]            For COUPONS visit: [email or web address]/join or [email or web address]   Suggested Substitutions Ensure Plus = Boost Plus = Carnation Breakfast Essentials = Boost Compact Ensure Active Clear = Boost Breeze Glucerna Shake = Boost Glucose Control = Carnation Breakfast Essentials SUGAR FREE  Please contact your RN Case Manager Bobbette Acron [phone]

## 2024-10-06 NOTE — TOC Initial Note (Signed)
 Transition of Care [facility]) - Initial/Assessment Note    Patient Details  Name: Lawrence [name] MRN: [id number] Date of Birth: 07-02-1956  [facility]) CM/SW Contact:    Alfonso Rummer, LCSW Phone Number: 10/06/2024, 1:41 PM  Clinical Narrative:                 KEN DELENA Rummer met with patient at bedside in room 240. Pt lives with family and reports adequate support. Pt report pcp is Dr. Sadie. Pharmacy is [facility]. Pt reports hx is [facility]. Pt request assistance with applying for medicaid.      Patient Goals and CMS Choice            Expected Discharge Plan and Services                    Home                           Prior Living Arrangements/Services                       Activities of Daily Living   ADL Screening (condition at time of admission) Independently performs ADLs?: Yes (appropriate for developmental age) Is the patient deaf or have difficulty hearing?: No Does the patient have difficulty seeing, even when wearing glasses/contacts?: No Does the patient have difficulty concentrating, remembering, or making decisions?: No  Permission Sought/Granted                  Emotional Assessment              Admission diagnosis:  Atypical chest pain [R07.89] Elevated troponin [R79.89] COPD exacerbation (HCC) [J44.1] Substernal precordial chest pain [R07.2] Moderate risk chest pain [R07.9] Patient Active Problem List   Diagnosis Date Noted   Hypocalcemia 10/06/2024   Protein-calorie malnutrition, severe 10/06/2024   Substernal precordial chest pain 10/05/2024   Right upper quadrant abdominal pain 08/14/2024   Elevated hemoglobin 08/14/2024   COPD (chronic obstructive pulmonary disease) (HCC) 06/10/2024   Bipolar disorder (HCC) 06/10/2024   Mild cognitive impairment 06/09/2024   Paroxysmal atrial fibrillation with RVR (HCC) 05/30/2024   Hypotension 10/18/2023   COPD  with acute exacerbation (HCC) 10/18/2023   Generalized weakness 10/18/2023   Left hip pain 10/13/2023   Malfunction of continuous or biphasic positive airway pressure machine 10/13/2023   Overweight (BMI 25.0-29.9) 10/02/2023   Chest pain 10/01/2023   Near syncope 09/29/2023   Dyslipidemia 09/29/2023   Anxiety 09/29/2023   Atrial fibrillation, chronic (HCC) 09/29/2023   NSTEMI (non-ST elevated myocardial infarction) (HCC) 08/04/2023   CAD S/P percutaneous coronary angioplasty 08/04/2023   PAD (peripheral artery disease) 07/11/2023   Type 2 diabetes mellitus without complications (HCC) 05/17/2023   Gout 02/22/2023   Hx of CABG    Chronic HFrEF (heart failure with reduced ejection fraction) (HCC)    Atherosclerosis of aorta 12/16/2020   Syncope 12/11/2020   Polycythemia 10/05/2020   Elevated troponin 03/07/2020   Chronic respiratory failure with hypoxia (HCC) 05/29/2019   Acute on chronic congestive heart failure (HCC) 04/18/2018   OSA (obstructive sleep apnea) 12/10/2015   Depression with anxiety 11/07/2015   BPH (benign prostatic hyperplasia) 08/01/2015   Achalasia 07/24/2014   GERD without esophagitis 06/07/2014   Mixed hyperlipidemia 04/09/2013   PCP:  [name], MD Pharmacy:   [facility] - [place], [place] - [address] [place] [address] Phone: [phone] Fax: [phone]  [facility] - [place], [place] - [address] [place] [address] Phone: [phone] Fax: [phone]  [id number] [place], [place] - [address] AT [facility] [address] [place] [address] [place] [address] Phone: [phone] Fax: [phone]  [facility] [address] [place] [address] Phone: [phone] Fax: [phone]     Social Drivers of Health (SDOH) Social History: SDOH Screenings   Food Insecurity:  Food Insecurity Present (10/06/2024)  Housing: Low Risk  (10/06/2024)  Transportation Needs: No Transportation Needs (10/06/2024)  Utilities: Not At Risk (10/06/2024)  Alcohol Screen: Low Risk  (03/14/2024)  Depression (PHQ2-9): Low Risk  (09/15/2024)  Recent Concern: Depression (PHQ2-9) - High Risk (08/11/2024)  Financial Resource Strain: Low Risk (06/19/2024)   Received from [facility]  Physical Activity: Insufficiently Active (03/14/2024)  Social Connections: Socially Isolated (10/06/2024)  Stress: No Stress Concern Present (03/14/2024)  Recent Concern: Stress - Stress Concern Present (01/13/2024)  Tobacco Use: Medium Risk (10/05/2024)  Health Literacy: Adequate Health Literacy (03/14/2024)   SDOH Interventions:     Readmission Risk Interventions    09/30/2023   10:28 AM 08/05/2023   10:11 AM 07/13/2023   10:52 AM  Readmission Risk Prevention Plan  Transportation Screening Complete Complete Complete  Medication Review (RN Care Manager) Complete Complete Complete  PCP or Specialist appointment within 3-5 days of discharge Complete Complete Complete  SW Recovery Care/Counseling Consult Complete Complete Complete  Palliative Care Screening Not Applicable Not Applicable Not Applicable  Skilled Nursing Facility Not Applicable Not Applicable Not Applicable

## 2024-10-06 NOTE — Evaluation (Signed)
 Occupational Therapy Evaluation Patient Details Name: Lawrence [name] MRN: [id number] DOB: 1955-11-29 Today's Date: 10/06/2024   History of Present Illness   Lawrence [name] is a 68 y.o. male with medical history significant for CAD, A-fib on Eliquis , HFrEF , PSVT/frequent PVCs, HTN,DM, COPD on 2L O2, multiple prior hospitalizations related to chest pain/CHF/COPD, most recently 6 weeks ago (10/5 to 08/22/2024, [facility] being admitted with chest pain and elevated troponin in the setting of a COPD exacerbation.     Clinical Impressions Patient was seen for OT evaluation this date. Prior to hospital admission, patient was independent with ADLs, his 41 year old grandson manages IADLs. He ambulates with a rollator at baseline and uses 2L of O2 PRN for SOB. Patient lives in a first floor apartment with 24/7 assist/support from grandson as needed. Patient has been hospitalized due to recurring chest pain. He reports chest pain 8/10 while resting in bed, agreeable to OT. O2 98% on 2L, OT removed and patient remained between 93-97% during eval on RA. Patient has a hospital bed at home, performed bed mobility using bed features with mod I, sit<>stand with CGA with r/w, ambulated 10 feet to recliner. Refused to perform toiletilng/grooming tasks, pain decreased to 6. OT educated on energy conservation and fall prevention strategies to implement into ADL routine, patient able to return demo sitting in figure 4 for LB dressing with reduction in pain.  Patient presents with deficits in standing tolerance/balance and overall activity tolerance, affecting safe and optimal ADL completion. Patient is currently requiring increased time and effort to manage ADLs with CGA while in stance.  Paient would benefit from skilled OT services to address noted impairments and functional limitations (see below for any additional details) in order to maximize safety and independence while minimizing future risk of falls, injury, and  readmission. Anticipate the need for follow up OT services upon acute hospital DC.      If plan is discharge home, recommend the following:   A little help with walking and/or transfers;A little help with bathing/dressing/bathroom     Functional Status Assessment   Patient has had a recent decline in their functional status and demonstrates the ability to make significant improvements in function in a reasonable and predictable amount of time.     Equipment Recommendations   None recommended by OT     Recommendations for Other Services         Precautions/Restrictions   Precautions Precautions: Fall Recall of Precautions/Restrictions: Intact Restrictions Weight Bearing Restrictions Per Provider Order: No     Mobility Bed Mobility Overal bed mobility: Modified Independent             General bed mobility comments: HOB raised, has hospital bed at home    Transfers Overall transfer level: Needs assistance Equipment used: Rolling walker (2 wheels) Transfers: Sit to/from Stand Sit to Stand: Contact guard assist                  Balance Overall balance assessment: Needs assistance Sitting-balance support: No upper extremity supported, Feet unsupported Sitting balance-Leahy Scale: Normal     Standing balance support: Reliant on assistive device for balance Standing balance-Leahy Scale: Fair                             ADL either performed or assessed with clinical judgement   ADL Overall ADL's : Needs assistance/impaired  Lower Body Dressing: Minimal assistance;Contact guard assist;Sit to/from stand Lower Body Dressing Details (indicate cue type and reason): cues for body mechanics Toilet Transfer: Contact guard assist;Rolling walker (2 wheels) Toilet Transfer Details (indicate cue type and reason): simulated Toileting- Clothing Manipulation and Hygiene: Contact guard assist Toileting - Clothing  Manipulation Details (indicate cue type and reason): simulated       General ADL Comments: patient requires increased time and effort to complete basic ADLs, self limiting during eval     Vision         Perception         Praxis         Pertinent Vitals/Pain Pain Assessment Pain Assessment: 0-10 Pain Score: 8  Pain Intervention(s): Premedicated before session, Limited activity within patient's tolerance, Monitored during session     Extremity/Trunk Assessment             Communication Communication Communication: No apparent difficulties   Cognition Arousal: Alert Behavior During Therapy: WFL for tasks assessed/performed Cognition: History of cognitive impairments             OT - Cognition Comments: patient reports my short term memory isn't what it used to be.                 Following commands: Intact       Cueing  General Comments   Cueing Techniques: Verbal cues  uses 2L during the day PRN for SOB, 97% during OT eval on RA   Exercises     Shoulder Instructions      Home Living Family/patient expects to be discharged to:: Private residence Living Arrangements: Other relatives Available Help at Discharge: Family;Available 24 hours/day Type of Home: Apartment Home Access: Level entry;Ramped entrance     Home Layout: One level     Bathroom Shower/Tub: Producer, Television/film/video: Standard Bathroom Accessibility: Yes How Accessible: Accessible via walker Home Equipment: Shower seat - built in;Rollator (4 wheels);BSC/3in1;Hospital bed;Grab bars - toilet;Grab bars - tub/shower          Prior Functioning/Environment Prior Level of Function : Independent/Modified Independent;History of Falls (last six months)             Mobility Comments: Modified independent ambulating with rollator.  2 L O2 at night (PRN during the day) ADLs Comments: Pt endorses being Ind with self care tasks but grandson is available to assist.  [name] performs most of IADLs.    OT Problem List: Decreased strength;Decreased activity tolerance;Impaired balance (sitting and/or standing)   OT Treatment/Interventions: Self-care/ADL training;Therapeutic exercise;Energy conservation;Therapeutic activities;Balance training;Patient/family education      OT Goals(Current goals can be found in the care plan section)   Acute Rehab OT Goals Patient Stated Goal: to go home OT Goal Formulation: With patient Time For Goal Achievement: 10/20/24 Potential to Achieve Goals: Good ADL Goals Pt Will Perform Grooming: with modified independence;standing Pt Will Perform Lower Body Dressing: with modified independence;sit to/from stand Pt Will Transfer to Toilet: with modified independence;grab bars Pt Will Perform Toileting - Clothing Manipulation and hygiene: with modified independence;sit to/from stand   OT Frequency:  Min 2X/week    Co-evaluation              AM-PAC OT 6 Clicks Daily Activity     Outcome Measure Help from another person eating meals?: None Help from another person taking care of personal grooming?: A Little Help from another person toileting, which includes using toliet, bedpan, or urinal?: A Little Help from  another person bathing (including washing, rinsing, drying)?: A Little Help from another person to put on and taking off regular upper body clothing?: A Little Help from another person to put on and taking off regular lower body clothing?: A Little 6 Click Score: 19   End of Session Equipment Utilized During Treatment: Gait belt;Rolling walker (2 wheels) Nurse Communication: Mobility status;Other (comment) (missing posey box in room)  Activity Tolerance: Patient tolerated treatment well;Other (comment) (self limiting) Patient left: in chair;with call bell/phone within reach  OT Visit Diagnosis: Unsteadiness on feet (R26.81);Muscle weakness (generalized) (M62.81);History of falling (Z91.81)                 Time: 8996-8972 OT Time Calculation (min): 24 min Charges:  OT General Charges $OT Visit: 1 Visit OT Evaluation $OT Eval Low Complexity: 1 Low OT Treatments $Self Care/Home Management : 23-37 mins  Rogers Clause, OT/L MSOT, 10/06/2024

## 2024-10-06 NOTE — Plan of Care (Signed)
  Problem: Education: Goal: Ability to describe self-care measures that may prevent or decrease complications (Diabetes Survival Skills Education) will improve Outcome: Progressing Goal: Individualized Educational Video(s) Outcome: Progressing   Problem: Coping: Goal: Ability to adjust to condition or change in health will improve Outcome: Progressing   Problem: Fluid Volume: Goal: Ability to maintain a balanced intake and output will improve Outcome: Progressing   Problem: Health Behavior/Discharge Planning: Goal: Ability to identify and utilize available resources and services will improve Outcome: Progressing Goal: Ability to manage health-related needs will improve Outcome: Progressing   Problem: Metabolic: Goal: Ability to maintain appropriate glucose levels will improve Outcome: Progressing   Problem: Nutritional: Goal: Maintenance of adequate nutrition will improve Outcome: Progressing Goal: Progress toward achieving an optimal weight will improve Outcome: Progressing   Problem: Skin Integrity: Goal: Risk for impaired skin integrity will decrease Outcome: Progressing   Problem: Tissue Perfusion: Goal: Adequacy of tissue perfusion will improve Outcome: Progressing   Problem: Education: Goal: Understanding of cardiac disease, CV risk reduction, and recovery process will improve Outcome: Progressing Goal: Individualized Educational Video(s) Outcome: Progressing   Problem: Activity: Goal: Ability to tolerate increased activity will improve Outcome: Progressing   Problem: Cardiac: Goal: Ability to achieve and maintain adequate cardiovascular perfusion will improve Outcome: Progressing   Problem: Health Behavior/Discharge Planning: Goal: Ability to safely manage health-related needs after discharge will improve Outcome: Progressing   Problem: Education: Goal: Knowledge of General Education information will improve Description: Including pain rating scale,  medication(s)/side effects and non-pharmacologic comfort measures Outcome: Progressing   Problem: Health Behavior/Discharge Planning: Goal: Ability to manage health-related needs will improve Outcome: Progressing   Problem: Clinical Measurements: Goal: Ability to maintain clinical measurements within normal limits will improve Outcome: Progressing Goal: Will remain free from infection Outcome: Progressing Goal: Diagnostic test results will improve Outcome: Progressing Goal: Respiratory complications will improve Outcome: Progressing Goal: Cardiovascular complication will be avoided Outcome: Progressing   Problem: Activity: Goal: Risk for activity intolerance will decrease Outcome: Progressing   Problem: Nutrition: Goal: Adequate nutrition will be maintained Outcome: Progressing   Problem: Coping: Goal: Level of anxiety will decrease Outcome: Progressing   Problem: Elimination: Goal: Will not experience complications related to bowel motility Outcome: Progressing Goal: Will not experience complications related to urinary retention Outcome: Progressing   Problem: Pain Managment: Goal: General experience of comfort will improve and/or be controlled Outcome: Progressing   Problem: Safety: Goal: Ability to remain free from injury will improve Outcome: Progressing   Problem: Skin Integrity: Goal: Risk for impaired skin integrity will decrease Outcome: Progressing   Problem: Education: Goal: Knowledge of disease or condition will improve Outcome: Progressing Goal: Knowledge of the prescribed therapeutic regimen will improve Outcome: Progressing Goal: Individualized Educational Video(s) Outcome: Progressing   Problem: Activity: Goal: Ability to tolerate increased activity will improve Outcome: Progressing Goal: Will verbalize the importance of balancing activity with adequate rest periods Outcome: Progressing   Problem: Respiratory: Goal: Ability to maintain a  clear airway will improve Outcome: Progressing Goal: Levels of oxygenation will improve Outcome: Progressing Goal: Ability to maintain adequate ventilation will improve Outcome: Progressing

## 2024-10-06 NOTE — Consult Note (Signed)
 Lawrence [name] is a 68 y.o. male  [id number]  Primary Cardiologist: [facility] cardiology Reason for Consultation: Chest pain  HPI: This is a 68 year old white male with a past medical history of four-vessel CABG in 2016 and multiple PCI's and stenting presented to the hospital with chest pain and shortness of breath.  Patient normally takes 2 L oxygen  because he has history of's COPD and obstructive severe sleep apnea beside having HFrEF with LVEF 35 to 40% on last echo on 9/25.  Patient now states that he is having a lot of chest pain pressure type associated with shortness of breath.  His symptoms improved in the ED after getting oxycodone  as his troponins were borderline.   Review of Systems: Patient also has some orthopnea and PND   Past Medical History:  Diagnosis Date   A-fib (HCC)    Anemia    Anginal pain    Anxiety    Arthritis    Asthma    Atrial fibrillation, chronic (HCC) 10/19/2023   CAD (coronary artery disease)    a. 2002 CABGx2 (LIMA->LAD, VG->VG->OM1);  b. 09/2012 DES->OM;  c. 03/2015 PTCA of LAD [facility]) in setting of atretic LIMA; d. 05/2015 Cath [facility]): nonobs dzs; e. 06/2015 Cath ([facility]): LM nl, LAD 45p/d ISR, 50d, D1/2 small, LCX 50p/d ISR, OM1 70ost, 30 ISR, VG->OM1 50ost, 45m, LIMA->LAD 99p/d - atretic, RCA dom, nl; f.cath 10/16: 40-50%(FFR 0.90) pLAD, 75% (FFR 0.77) mLAD s/p PCI/DES, oRCA 40% (FFR0.95)   Cancer (HCC)    SKIN CANCER ON BACK   Celiac disease    Chronic diastolic CHF (congestive heart failure) (HCC)    a. 06/2009 Echo: EF 60-65%, Gr 1 DD, triv AI, mildly dil LA, nl RV.   COPD (chronic obstructive pulmonary disease) (HCC)    a. Chronic bronchitis and emphysema.   DDD (degenerative disc disease), lumbar    Diverticulosis    Dysrhythmia    Essential hypertension    GERD (gastroesophageal reflux disease)    History of hiatal hernia    History of kidney stones    H/O   History of tobacco abuse    a. Quit 2014.   Myocardial infarction [facility]) 2002   4  STENTS   Pancreatitis    PSVT (paroxysmal supraventricular tachycardia)    a. 10/2012 Noted on Zio Patch.   RLL pneumonia 05/30/2024   Sleep apnea    LOST CORD TO CPAP -ONLY 02 @ BEDTIME   Tubular adenoma of colon    Type II diabetes mellitus (HCC)     Medications Prior to Admission  Medication Sig Dispense Refill   acetaminophen  (TYLENOL ) 325 MG tablet Take 2 tablets (650 mg total) by mouth every 6 (six) hours as needed for mild pain (pain score 1-3) or fever (or Fever >/= 101). 20 tablet 0   albuterol  (PROVENTIL ) (2.5 MG/3ML) 0.083% nebulizer solution Take 3 mLs (2.5 mg total) by nebulization every 6 (six) hours as needed for wheezing or shortness of breath. 150 mL 2   allopurinol  (ZYLOPRIM ) 300 MG tablet TAKE 1 TABLET BY MOUTH TWICE A DAY 180 tablet 1   ALPRAZolam  (XANAX ) 1 MG tablet TAKE 1 TABLET BY MOUTH 3 TIMES A DAY AS NEEDED 90 tablet 3   apixaban  (ELIQUIS ) 5 MG TABS tablet Take 1 tablet (5 mg total) by mouth 2 (two) times daily. 60 tablet 5   atorvastatin  (LIPITOR ) 80 MG tablet TAKE 1 TABLET BY MOUTH AT BEDTIME 90 tablet 4   BREZTRI  AEROSPHERE 160-9-4.8 MCG/ACT  AERO inhaler INHALE 2 PUFFS BY MOUTH INTO THE LUNGS 2TIMES DAILY 10.7 g 11   cetirizine  (ZYRTEC ) 10 MG tablet Take 1 tablet (10 mg total) by mouth at bedtime. 90 tablet 1   cholecalciferol  (VITAMIN D3) 25 MCG (1000 UNIT) tablet Take 1,000 Units by mouth daily.     Cyanocobalamin  (VITAMIN B-12) 1000 MCG SUBL Place 1 tablet under the tongue daily.     dapagliflozin  propanediol (FARXIGA ) 10 MG TABS tablet TAKE 1 TABLET BY MOUTH DAILY 30 tablet 5   famotidine  (PEPCID ) 20 MG tablet TAKE 1 TABLET BY MOUTH TWICE A DAY 60 tablet 2   iron  polysaccharides (FERREX 150) 150 MG capsule Take one capsul every Monday, Wednesday, and Friday     isosorbide  mononitrate (IMDUR ) 120 MG 24 hr tablet Take 1 tablet (120 mg total) by mouth daily. 30 tablet 11   metFORMIN  (GLUCOPHAGE -XR) 500 MG 24 hr tablet TAKE 1 TABLET BY MOUTH DAILY WITH EVENING  MEAL 60 tablet 3   methocarbamol  (ROBAXIN ) 500 MG tablet TAKE 1 TABLET BY MOUTH EVERY 8 HOURS AS NEEDED FOR MUSCLE SPASMS 90 tablet 3   metoprolol  succinate (TOPROL -XL) 25 MG 24 hr tablet TAKE 1 TABLET BY MOUTH DAILY 90 tablet 1   nitroGLYCERIN  (NITROSTAT ) 0.4 MG SL tablet Place 1 tablet (0.4 mg total) under the tongue every 5 (five) minutes as needed for chest pain (Do not take if blood pressure is low, systolic BP<110).     omega-3 acid ethyl esters (LOVAZA ) 1 g capsule TAKE 4 CAPSULES (4 GRAMS TOTAL) BY MOUTHDAILY 120 capsule 5   ondansetron  (ZOFRAN ) 4 MG tablet Take 1 tablet (4 mg total) by mouth every 8 (eight) hours as needed for nausea or vomiting. 10 tablet 0   oxyCODONE -acetaminophen  (PERCOCET) 10-325 MG tablet TAKE 1 TABLET BY MOUTH EVERY 4 HOURS AS NEEDED FOR PAIN 180 tablet 0   pantoprazole  (PROTONIX ) 40 MG tablet TAKE 1 TABLET BY MOUTH 2 TIMES DAILY (30MINUTES BEFORE A MEAL) 60 tablet 0   predniSONE  (DELTASONE ) 20 MG tablet Take 1 tablet (20 mg total) by mouth 2 (two) times daily with a meal for 5 days. 10 tablet 0   ranolazine  (RANEXA ) 1000 MG SR tablet Take 1 tablet (1,000 mg total) by mouth 2 (two) times daily. 60 tablet 0   sacubitril -valsartan  (ENTRESTO ) 49-51 MG Take 1 tablet by mouth 2 (two) times daily. 60 tablet 1   spironolactone  (ALDACTONE ) 25 MG tablet Take 1 tablet (25 mg total) by mouth daily.     traZODone  (DESYREL ) 150 MG tablet TAKE 1 TABLET BY MOUTH AT BEDTIME AS NEEDED FOR SLEEP. 30 tablet 11   Accu-Chek Softclix Lancets lancets Use as instructed to check sugar daily for type 2 diabetes. 100 each 4   Blood Glucose Monitoring Suppl (ACCU-CHEK GUIDE) w/Device KIT Use to check blood sugars as directed 1 kit 0   Blood Pressure Monitor DEVI Use to check blood pressure daily 1 each 0   glucose blood (ACCU-CHEK GUIDE) test strip Use as instructed to check sugar daily for type 2 diabetes. 100 strip 4   lithium  carbonate 150 MG capsule Take 150 mg by mouth 2 (two) times daily.  (Patient not taking: Reported on 08/14/2024)     OXYGEN  Inhale 2 L into the lungs at bedtime as needed (for shortness of breath).        allopurinol   300 mg Oral BID   atorvastatin   80 mg Oral QHS   dapagliflozin  propanediol  10 mg Oral Daily  famotidine   20 mg Oral BID   feeding supplement  237 mL Oral BID BM   guaiFENesin   1,200 mg Oral BID   insulin  aspart  0-15 Units Subcutaneous TID WC   insulin  aspart  0-5 Units Subcutaneous QHS   isosorbide  mononitrate  120 mg Oral Daily   methylPREDNISolone  (SOLU-MEDROL ) injection  40 mg Intravenous Q12H   Followed by   NOREEN ON 10/07/2024] predniSONE   40 mg Oral Q breakfast   multivitamin with minerals  1 tablet Oral Daily   omega-3 acid ethyl esters  2 g Oral BID   pantoprazole   40 mg Oral Daily   ranolazine   1,000 mg Oral BID   sacubitril -valsartan   1 tablet Oral BID   spironolactone   25 mg Oral Daily    Infusions:  sodium chloride  100 mL/hr at 10/06/24 1110   heparin       Allergies  Allergen Reactions   Demerol  [Meperidine Hcl]    Demerol [Meperidine] Hives   Morphine      Pill form not tolerated. Can have fluid form   Sulfa Antibiotics Hives   Albuterol  Other (See Comments) and Palpitations    Pt currently uses this medication.   Empagliflozin  Other (See Comments) and Hives    Perineal pain  Other Reaction(s): Other (See Comments)   Morphine  Sulfate Nausea And Vomiting, Rash and Other (See Comments)    Pt states that he is only allergic to the tablet form of this medication.      Social History   Socioeconomic History   Marital status: Widowed    Spouse name: Not on file   Number of children: 1   Years of education: Not on file   Highest education level: 10th grade  Occupational History   Occupation: Disabled   Occupation: on tree surgeon  Tobacco Use   Smoking status: Former    Current packs/day: 0.00    Average packs/day: 2.0 packs/day for 51.0 years (102.0 ttl pk-yrs)    Types: Cigarettes    Start  date: 04/22/1962    Quit date: 04/22/2013    Years since quitting: 11.4   Smokeless tobacco: Never   Tobacco comments:    Reports not smoking for approx 8 years.  Vaping Use   Vaping status: Never Used  Substance and Sexual Activity   Alcohol use: No    Comment: Hx Alcohol Abuse   Drug use: Not Currently    Types: Marijuana   Sexual activity: Not Currently  Other Topics Concern   Not on file  Social History Narrative   Pt lives in [place] with wife.  Does not routinely exercise.   Social Drivers of Corporate Investment Banker Strain: Low Risk (06/19/2024)   Received from [facility]   Overall Financial Resource Strain (CARDIA)    How hard is it for you to pay for the very basics like food, housing, medical care, and heating?: Not very hard  Food Insecurity: Food Insecurity Present (10/06/2024)   Hunger Vital Sign    Worried About Running Out of Food in the Last Year: Sometimes true    Ran Out of Food in the Last Year: Never true  Transportation Needs: No Transportation Needs (10/06/2024)   PRAPARE - Administrator, Civil Service (Medical): No    Lack of Transportation (Non-Medical): No  Physical Activity: Insufficiently Active (03/14/2024)   Exercise Vital Sign    Days of Exercise per Week: 3 days    Minutes of Exercise per Session: 20  min  Stress: No Stress Concern Present (03/14/2024)   [employer] of Occupational Health - Occupational Stress Questionnaire    Feeling of Stress : Only a little  Recent Concern: Stress - Stress Concern Present (01/13/2024)   [employer] of Occupational Health - Occupational Stress Questionnaire    Feeling of Stress : To some extent  Social Connections: Socially Isolated (10/06/2024)   Social Connection and Isolation Panel    Frequency of Communication with Friends and Family: More than three times a week    Frequency of Social Gatherings with Friends and Family: Once a week    Attends Religious Services: Never     Database Administrator or Organizations: No    Attends Banker Meetings: Never    Marital Status: Widowed  Intimate Partner Violence: Not At Risk (10/06/2024)   Humiliation, Afraid, Rape, and Kick questionnaire    Fear of Current or Ex-Partner: No    Emotionally Abused: No    Physically Abused: No    Sexually Abused: No    Family History  Problem Relation Age of Onset   Heart attack Mother    Depression Mother    Heart disease Mother    COPD Mother    Hypertension Mother    Heart attack Father    Diabetes Father    Depression Father    Heart disease Father    Cirrhosis Father    Parkinson's disease Brother     PHYSICAL EXAM: Vitals:   10/06/24 1135 10/06/24 1527  BP: 93/62 (!) 88/64  Pulse: 95 93  Resp: 18 18  Temp: 97.6 F (36.4 C) 97.6 F (36.4 C)  SpO2: 96% 94%     Intake/Output Summary (Last 24 hours) at 10/06/2024 1704 Last data filed at 10/06/2024 1338 Gross per 24 hour  Intake 2973 ml  Output 1450 ml  Net 1523 ml    General:  Well appearing. No respiratory difficulty HEENT: normal Neck: supple. no JVD. Carotids 2+ bilat; no bruits. No lymphadenopathy or thryomegaly appreciated. Cor: PMI nondisplaced. Regular rate & rhythm. No rubs, gallops or murmurs. Lungs: clear Abdomen: soft, nontender, nondistended. No hepatosplenomegaly. No bruits or masses. Good bowel sounds. Extremities: no cyanosis, clubbing, rash, edema Neuro: alert & oriented x 3, cranial nerves grossly intact. moves all 4 extremities w/o difficulty. Affect pleasant.  ECG: Atrial fibrillation with ventricular rate 96 bpm, old anteroseptal wall MI and old inferior wall MI nonspecific ST-T changes  Results for orders placed or performed during the hospital encounter of 10/05/24 (from the past 24 hours)  Basic metabolic panel     Status: Abnormal   Collection Time: 10/05/24  5:50 PM  Result Value Ref Range   Sodium 143 135 - 145 mmol/L   Potassium 3.2 (L) 3.5 - 5.1 mmol/L    Chloride 110 98 - 111 mmol/L   CO2 19 (L) 22 - 32 mmol/L   Glucose, Bld 77 70 - 99 mg/dL   BUN 19 8 - 23 mg/dL   Creatinine, Ser 9.19 0.61 - 1.24 mg/dL   Calcium  6.4 (LL) 8.9 - 10.3 mg/dL   GFR, Estimated >39 >39 mL/min   Anion gap 13 5 - 15  CBC     Status: None   Collection Time: 10/05/24  5:50 PM  Result Value Ref Range   WBC 7.4 4.0 - 10.5 K/uL   RBC 5.45 4.22 - 5.81 MIL/uL   Hemoglobin 16.4 13.0 - 17.0 g/dL   HCT 51.0 60.9 - 47.9 %  MCV 89.7 80.0 - 100.0 fL   MCH 30.1 26.0 - 34.0 pg   MCHC 33.5 30.0 - 36.0 g/dL   RDW 85.2 88.4 - 84.4 %   Platelets 189 150 - 400 K/uL   nRBC 0.0 0.0 - 0.2 %  Protime-INR (order if Patient is taking Coumadin / Warfarin)     Status: Abnormal   Collection Time: 10/05/24  5:50 PM  Result Value Ref Range   Prothrombin Time 17.2 (H) 11.4 - 15.2 seconds   INR 1.3 (H) 0.8 - 1.2  D-dimer, quantitative     Status: None   Collection Time: 10/05/24  5:50 PM  Result Value Ref Range   D-Dimer, Quant 0.28 0.00 - 0.50 ug/mL-FEU  Resp panel by RT-PCR (RSV, Flu A&B, Covid) Anterior Nasal Swab     Status: None   Collection Time: 10/05/24  7:13 PM   Specimen: Anterior Nasal Swab  Result Value Ref Range   SARS Coronavirus 2 by RT PCR NEGATIVE NEGATIVE   Influenza A by PCR NEGATIVE NEGATIVE   Influenza B by PCR NEGATIVE NEGATIVE   Resp Syncytial Virus by PCR NEGATIVE NEGATIVE  Troponin T, High Sensitivity     Status: Abnormal   Collection Time: 10/05/24  7:13 PM  Result Value Ref Range   Troponin T High Sensitivity 28 (H) 0 - 19 ng/L  Troponin T, High Sensitivity     Status: Abnormal   Collection Time: 10/05/24  7:42 PM  Result Value Ref Range   Troponin T High Sensitivity 27 (H) 0 - 19 ng/L  Glucose, capillary     Status: Abnormal   Collection Time: 10/05/24 10:41 PM  Result Value Ref Range   Glucose-Capillary 144 (H) 70 - 99 mg/dL  HIV Antibody (routine testing w rflx)     Status: None   Collection Time: 10/06/24 12:26 AM  Result Value Ref Range    HIV Screen 4th Generation wRfx Non Reactive Non Reactive  Magnesium      Status: None   Collection Time: 10/06/24  4:11 AM  Result Value Ref Range   Magnesium  2.3 1.7 - 2.4 mg/dL  Comprehensive metabolic panel     Status: Abnormal   Collection Time: 10/06/24  4:11 AM  Result Value Ref Range   Sodium 134 (L) 135 - 145 mmol/L   Potassium 4.5 3.5 - 5.1 mmol/L   Chloride 94 (L) 98 - 111 mmol/L   CO2 22 22 - 32 mmol/L   Glucose, Bld 376 (H) 70 - 99 mg/dL   BUN 33 (H) 8 - 23 mg/dL   Creatinine, Ser 8.27 (H) 0.61 - 1.24 mg/dL   Calcium  9.5 8.9 - 10.3 mg/dL   Total Protein 6.5 6.5 - 8.1 g/dL   Albumin 4.1 3.5 - 5.0 g/dL   AST 17 15 - 41 U/L   ALT 10 0 - 44 U/L   Alkaline Phosphatase 71 38 - 126 U/L   Total Bilirubin 0.7 0.0 - 1.2 mg/dL   GFR, Estimated 42 (L) >60 mL/min   Anion gap 18 (H) 5 - 15  Glucose, capillary     Status: Abnormal   Collection Time: 10/06/24  8:45 AM  Result Value Ref Range   Glucose-Capillary 280 (H) 70 - 99 mg/dL  Glucose, capillary     Status: Abnormal   Collection Time: 10/06/24 11:39 AM  Result Value Ref Range   Glucose-Capillary 218 (H) 70 - 99 mg/dL  Glucose, capillary     Status: Abnormal   Collection Time:  10/06/24  3:23 PM  Result Value Ref Range   Glucose-Capillary 205 (H) 70 - 99 mg/dL   *Note: Due to a large number of results and/or encounters for the requested time period, some results have not been displayed. A complete set of results can be found in Results Review.   DG Chest 2 View Result Date: 10/05/2024 CLINICAL DATA:  Chest pain and weakness. EXAM: CHEST - 2 VIEW COMPARISON:  September 09, 2024 FINDINGS: Multiple sternal wires and vascular clips are seen. The heart size and mediastinal contours are within normal limits. A coronary artery stent is in place. There is marked severity calcification of the aortic arch. Both lungs are clear. The visualized skeletal structures are unremarkable. IMPRESSION: 1. Evidence of prior median sternotomy/CABG.  2. No active cardiopulmonary disease. Electronically Signed   By: Suzen Dials M.D.   On: 10/05/2024 18:32     ASSESSMENT AND PLAN: Chest pain with borderline troponin and initially creatinine was normal but is elevated to 1.7 right now.  Advise changing Eliquis  to heparin  and get another 2 sets of troponin.  If he continues to have chest pain and if troponins are elevated may consider doing cardiac catheterization.  Explained risk benefit to the patient and patient is in agreement.  Darrin Apodaca Fernand

## 2024-10-06 NOTE — Care Management Obs Status (Signed)
 MEDICARE OBSERVATION STATUS NOTIFICATION   Patient Details  Name: Lawrence [name] MRN: [id number] Date of Birth: 1956-06-28   Medicare Observation Status Notification Given:no patient admitted        [name] 10/06/2024, 2:01 PM

## 2024-10-06 NOTE — Plan of Care (Signed)
  Problem: Fluid Volume: Goal: Ability to maintain a balanced intake and output will improve Outcome: Progressing   Problem: Nutritional: Goal: Maintenance of adequate nutrition will improve Outcome: Progressing   Problem: Clinical Measurements: Goal: Will remain free from infection Outcome: Progressing   Problem: Nutrition: Goal: Adequate nutrition will be maintained Outcome: Progressing

## 2024-10-06 NOTE — Consult Note (Signed)
 PHARMACY - ANTICOAGULATION CONSULT NOTE  Pharmacy Consult for Heparin  Indication: atrial fibrillation  Allergies  Allergen Reactions   Demerol  [Meperidine Hcl]    Demerol [Meperidine] Hives   Morphine      Pill form not tolerated. Can have fluid form   Sulfa Antibiotics Hives   Albuterol  Other (See Comments) and Palpitations    Pt currently uses this medication.   Empagliflozin  Other (See Comments) and Hives    Perineal pain  Other Reaction(s): Other (See Comments)   Morphine  Sulfate Nausea And Vomiting, Rash and Other (See Comments)    Pt states that he is only allergic to the tablet form of this medication.      Patient Measurements: Height: 5' 7 (170.2 cm) Weight: 77.6 kg (171 lb) IBW/kg (Calculated) : 66.1 HEPARIN  DW (KG): 77.6  Vital Signs: Temp: 97.6 F (36.4 C) (11/21 1527) Temp Source: Axillary (11/21 0800) BP: 88/64 (11/21 1527) Pulse Rate: 93 (11/21 1527)  Labs: Recent Labs    10/05/24 1750 10/06/24 0411  HGB 16.4  --   HCT 48.9  --   PLT 189  --   LABPROT 17.2*  --   INR 1.3*  --   CREATININE 0.80 1.72*    Estimated Creatinine Clearance: 37.4 mL/min (A) (by C-G formula based on SCr of 1.72 mg/dL (H)).   Medical History: Past Medical History:  Diagnosis Date   A-fib (HCC)    Anemia    Anginal pain    Anxiety    Arthritis    Asthma    Atrial fibrillation, chronic (HCC) 10/19/2023   CAD (coronary artery disease)    a. 2002 CABGx2 (LIMA->LAD, VG->VG->OM1);  b. 09/2012 DES->OM;  c. 03/2015 PTCA of LAD [facility]) in setting of atretic LIMA; d. 05/2015 Cath [facility]): nonobs dzs; e. 06/2015 Cath ([facility]): LM nl, LAD 45p/d ISR, 50d, D1/2 small, LCX 50p/d ISR, OM1 70ost, 30 ISR, VG->OM1 50ost, 7m, LIMA->LAD 99p/d - atretic, RCA dom, nl; f.cath 10/16: 40-50%(FFR 0.90) pLAD, 75% (FFR 0.77) mLAD s/p PCI/DES, oRCA 40% (FFR0.95)   Cancer (HCC)    SKIN CANCER ON BACK   Celiac disease    Chronic diastolic CHF (congestive heart failure) (HCC)    a. 06/2009 Echo: EF  60-65%, Gr 1 DD, triv AI, mildly dil LA, nl RV.   COPD (chronic obstructive pulmonary disease) (HCC)    a. Chronic bronchitis and emphysema.   DDD (degenerative disc disease), lumbar    Diverticulosis    Dysrhythmia    Essential hypertension    GERD (gastroesophageal reflux disease)    History of hiatal hernia    History of kidney stones    H/O   History of tobacco abuse    a. Quit 2014.   Myocardial infarction [facility]) 2002   4 STENTS   Pancreatitis    PSVT (paroxysmal supraventricular tachycardia)    a. 10/2012 Noted on Zio Patch.   RLL pneumonia 05/30/2024   Sleep apnea    LOST CORD TO CPAP -ONLY 02 @ BEDTIME   Tubular adenoma of colon    Type II diabetes mellitus (HCC)     Medications:  Medications Prior to Admission  Medication Sig Dispense Refill Last Dose/Taking   acetaminophen  (TYLENOL ) 325 MG tablet Take 2 tablets (650 mg total) by mouth every 6 (six) hours as needed for mild pain (pain score 1-3) or fever (or Fever >/= 101). 20 tablet 0 Unknown   albuterol  (PROVENTIL ) (2.5 MG/3ML) 0.083% nebulizer solution Take 3 mLs (2.5 mg total) by nebulization  every 6 (six) hours as needed for wheezing or shortness of breath. 150 mL 2 Unknown   allopurinol  (ZYLOPRIM ) 300 MG tablet TAKE 1 TABLET BY MOUTH TWICE A DAY 180 tablet 1 10/05/2024 Morning   ALPRAZolam  (XANAX ) 1 MG tablet TAKE 1 TABLET BY MOUTH 3 TIMES A DAY AS NEEDED 90 tablet 3 10/05/2024 Morning   apixaban  (ELIQUIS ) 5 MG TABS tablet Take 1 tablet (5 mg total) by mouth 2 (two) times daily. 60 tablet 5 10/05/2024 Morning   atorvastatin  (LIPITOR ) 80 MG tablet TAKE 1 TABLET BY MOUTH AT BEDTIME 90 tablet 4 10/04/2024 Evening   BREZTRI  AEROSPHERE 160-9-4.8 MCG/ACT AERO inhaler INHALE 2 PUFFS BY MOUTH INTO THE LUNGS 2TIMES DAILY 10.7 g 11 10/05/2024 Morning   cetirizine  (ZYRTEC ) 10 MG tablet Take 1 tablet (10 mg total) by mouth at bedtime. 90 tablet 1 10/04/2024 Evening   cholecalciferol  (VITAMIN D3) 25 MCG (1000 UNIT) tablet Take  1,000 Units by mouth daily.   10/05/2024 Morning   Cyanocobalamin  (VITAMIN B-12) 1000 MCG SUBL Place 1 tablet under the tongue daily.   10/05/2024 Morning   dapagliflozin  propanediol (FARXIGA ) 10 MG TABS tablet TAKE 1 TABLET BY MOUTH DAILY 30 tablet 5 10/05/2024 Morning   famotidine  (PEPCID ) 20 MG tablet TAKE 1 TABLET BY MOUTH TWICE A DAY 60 tablet 2 10/05/2024 Morning   iron  polysaccharides (FERREX 150) 150 MG capsule Take one capsul every Monday, Wednesday, and Friday   10/04/2024   isosorbide  mononitrate (IMDUR ) 120 MG 24 hr tablet Take 1 tablet (120 mg total) by mouth daily. 30 tablet 11 10/05/2024 Morning   metFORMIN  (GLUCOPHAGE -XR) 500 MG 24 hr tablet TAKE 1 TABLET BY MOUTH DAILY WITH EVENING MEAL 60 tablet 3 10/04/2024 Evening   methocarbamol  (ROBAXIN ) 500 MG tablet TAKE 1 TABLET BY MOUTH EVERY 8 HOURS AS NEEDED FOR MUSCLE SPASMS 90 tablet 3 10/05/2024 Morning   metoprolol  succinate (TOPROL -XL) 25 MG 24 hr tablet TAKE 1 TABLET BY MOUTH DAILY 90 tablet 1 10/05/2024 Morning   nitroGLYCERIN  (NITROSTAT ) 0.4 MG SL tablet Place 1 tablet (0.4 mg total) under the tongue every 5 (five) minutes as needed for chest pain (Do not take if blood pressure is low, systolic BP<110).   Unknown   omega-3 acid ethyl esters (LOVAZA ) 1 g capsule TAKE 4 CAPSULES (4 GRAMS TOTAL) BY MOUTHDAILY 120 capsule 5 10/05/2024 Morning   ondansetron  (ZOFRAN ) 4 MG tablet Take 1 tablet (4 mg total) by mouth every 8 (eight) hours as needed for nausea or vomiting. 10 tablet 0 Unknown   oxyCODONE -acetaminophen  (PERCOCET) 10-325 MG tablet TAKE 1 TABLET BY MOUTH EVERY 4 HOURS AS NEEDED FOR PAIN 180 tablet 0 10/05/2024 at  2:00 PM   pantoprazole  (PROTONIX ) 40 MG tablet TAKE 1 TABLET BY MOUTH 2 TIMES DAILY (30MINUTES BEFORE A MEAL) 60 tablet 0 10/05/2024 Morning   predniSONE  (DELTASONE ) 20 MG tablet Take 1 tablet (20 mg total) by mouth 2 (two) times daily with a meal for 5 days. 10 tablet 0 10/05/2024 Morning   ranolazine  (RANEXA ) 1000 MG  SR tablet Take 1 tablet (1,000 mg total) by mouth 2 (two) times daily. 60 tablet 0 10/05/2024 Morning   sacubitril -valsartan  (ENTRESTO ) 49-51 MG Take 1 tablet by mouth 2 (two) times daily. 60 tablet 1 10/05/2024 Morning   spironolactone  (ALDACTONE ) 25 MG tablet Take 1 tablet (25 mg total) by mouth daily.   10/05/2024 Morning   traZODone  (DESYREL ) 150 MG tablet TAKE 1 TABLET BY MOUTH AT BEDTIME AS NEEDED FOR SLEEP. 30 tablet 11  10/04/2024 Evening   Accu-Chek Softclix Lancets lancets Use as instructed to check sugar daily for type 2 diabetes. 100 each 4    Blood Glucose Monitoring Suppl (ACCU-CHEK GUIDE) w/Device KIT Use to check blood sugars as directed 1 kit 0    Blood Pressure Monitor DEVI Use to check blood pressure daily 1 each 0    glucose blood (ACCU-CHEK GUIDE) test strip Use as instructed to check sugar daily for type 2 diabetes. 100 strip 4    lithium  carbonate 150 MG capsule Take 150 mg by mouth 2 (two) times daily. (Patient not taking: Reported on 08/14/2024)      OXYGEN  Inhale 2 L into the lungs at bedtime as needed (for shortness of breath).      Scheduled:   allopurinol   300 mg Oral BID   atorvastatin   80 mg Oral QHS   dapagliflozin  propanediol  10 mg Oral Daily   famotidine   20 mg Oral BID   feeding supplement  237 mL Oral BID BM   guaiFENesin   1,200 mg Oral BID   insulin  aspart  0-15 Units Subcutaneous TID WC   insulin  aspart  0-5 Units Subcutaneous QHS   isosorbide  mononitrate  120 mg Oral Daily   methylPREDNISolone  (SOLU-MEDROL ) injection  40 mg Intravenous Q12H   Followed by   NOREEN ON 10/07/2024] predniSONE   40 mg Oral Q breakfast   multivitamin with minerals  1 tablet Oral Daily   omega-3 acid ethyl esters  2 g Oral BID   pantoprazole   40 mg Oral Daily   ranolazine   1,000 mg Oral BID   sacubitril -valsartan   1 tablet Oral BID   spironolactone   25 mg Oral Daily   Infusions:   sodium chloride  100 mL/hr at 10/06/24 1110   PRN: acetaminophen , ALPRAZolam , levalbuterol ,  methocarbamol , nitroGLYCERIN , ondansetron  (ZOFRAN ) IV, mouth rinse, oxyCODONE -acetaminophen  **AND** oxyCODONE , traZODone  Anti-infectives (From admission, onward)    Start     Dose/Rate Route Frequency Ordered Stop   10/05/24 1845  azithromycin  (ZITHROMAX ) tablet 500 mg        500 mg Oral  Once 10/05/24 1840 10/05/24 1908       Assessment: 68 y.o. male with medical history significant for CAD (s/p of stent x 4/CABG 2016), A-fib on Eliquis , HFrEF (EF 35 to 40%, 07/2024), PSVT/frequent PVCs, HTN,DM, COPD on 2L O2, OSA not using CPAP, multiple prior hospitalizations related to chest pain/CHF/COPD, most recently 6 weeks ago (10/5 to 08/22/2024, [facility]), being admitted with chest pain and elevated troponin in the setting of a COPD exacerbation. Pharmacy consulted to start heparin  for afib. Apixaban  will be discontinued. CBC stable. Will use aPTT to monitor heparin  infusion.   Goal of Therapy:  Heparin  level 0.3-0.7 units/ml once heparin  level and aPTT correlate.  aPTT 66-102 seconds Monitor platelets by anticoagulation protocol: Yes   Plan:  NO initial bolus Start heparin  infusion at 1050 units/hr Check aPTT in 8 hours and daily heparin  level and CBC while on heparin  Continue to monitor H&H and platelets Switch to HL monitoring once HL and aPTT correlate.   Cathaleen GORMAN Blanch, PharmD, BCPS 10/06/2024,3:31 PM

## 2024-10-06 NOTE — Progress Notes (Addendum)
 Initial Nutrition Assessment  DOCUMENTATION CODES:   Severe malnutrition in context of chronic illness  INTERVENTION:   -Liberalize diet to carb modified for wider variety of meal selections -MVI with minerals daily -Ensure Plus High Protein po BID, each supplement provides 350 kcal and 20 grams of protein  -Magic cup TID with meals, each supplement provides 290 kcal and 9 grams of protein  -RD discussed importance of supplements and recommendation of use at home to optimize oral intake; RD provided Nutrition Post Hospital Stay to AVS/ Discharge Summary for supplement options for retail purchase at discharge  -If patient experiences persistent hyperglycemia (defined as >=180 mg/dL (89.9 mmol/L on two or more occasions), recommend considering initiation of insulin  therapy and/or consulting diabetes coordinator team for further recommendations   NUTRITION DIAGNOSIS:   Severe Malnutrition related to chronic illness (COPD, CHF) as evidenced by moderate muscle depletion, severe muscle depletion, mild fat depletion, moderate fat depletion, percent weight loss.  GOAL:   Patient will meet greater than or equal to 90% of their needs  MONITOR:   PO intake, Supplement acceptance  REASON FOR ASSESSMENT:   Consult Assessment of nutrition requirement/status  ASSESSMENT:   68 y.o. male with medical history significant for CAD (s/p of stent x 4/CABG 2016), A-fib on Eliquis , HFrEF (EF 35 to 40%, 07/2024), PSVT/frequent PVCs, HTN,DM, COPD on 2L O2, OSA not using CPAP, multiple prior hospitalizations related to chest pain/CHF/COPD (most recently 6 weeks ago) being admitted with chest pain and elevated troponin in the setting of a COPD exacerbation.  Patient admitted with substernal precordial chest pain and COPD exacerbation.   Reviewed I/O's: +2 L x 24 hours  UOP: 500 ml x 24 hours  Per H&P, patient hypocalcemia (received calcium  gluconate in ED). Md plans to check magnesium  and albumin.    Spoke with patient at bedside. Pt was asleep at time of visit, but arose easily to voice and touch. Patient reports not feeling well, however, eager to engage RD in conversation. He reports he started feeling poorly about 3 days PTA due to shortness of breath and cold-like symptoms (he shares that his grandson has just been getting over a cold and feels like he may have also caught it). He states that he currently does not have a taste for food and has little appetite, secondary to respiratory distress and overall feeling poorly. He states he has eating very little over the past 3 days PTA.   Prior to acute illness, patient states he was doing well. He uses a walker at baseline and lives alone in his apartment. He is able to complete all household chores and ADLs without difficulty. He reports that he has a fair appetite at baseline and usually consumes 2 meals per day (he always skips breakfast as he tends to sleep late). Meals consist of chicken, mashed potatoes, and green beans. He also snacks on foods such as potato chips, oranges, and crackers. He drinks only water.   Patient conformed that he has a history of celiac diease, but does not avoid gluten or follow a gluten free diet. He regular consumes nabs and saltines.   Patient endorses weight loss, but suspects that it is acute in nature (over the past few days due to lack of PO intake). Patient shares that his UBW 180# and was 171# when he last weighed this week. Reviewed weight history. Patient has experience a 10.2% weight loss over the past 2 months, which is significant for time frame. Noted weight trends of  80.3 kg -86.2 kg from 07/17/24-09/15/24. Suspect fluid losses due to history of CHF may be accounting for weight fluctuations. No edema noted on exam today.   Discussed importance of good meal and supplement intake to promote healing. Pt states he does not drink supplements at home, but is willing to try here as they have been recommended  to him in the past. He is amenable to Ensure. Pt with poor oral intake and would benefit from nutrient dense supplement. One Ensure Enlive supplement provides 350 kcals, 20 grams protein, and 44-45 grams of carbohydrate vs one Glucerna shake supplement, which provides 220 kcals, 10 grams of protein, and 26 grams of carbohydrate. Given pt's hx of DM, RD will reassess adequacy of PO intake, CBGS, and adjust supplement regimen as appropriate at follow-up.    Medications reviewed and include solu-medrol , lovaza , protonix , and aldactone .   Lab Results  Component Value Date   HGBA1C 6.8 (H) 08/11/2024   PTA DM medications are 10 mg farxiga  daily and 500 mg metformin  BID. Noted patient has been on prednisone  PTA, which may increase blood sugar readings. DM is well controlled at this time.   Labs reviewed: Na: 134, CBGS: 144 (inpatient orders for glycemic control are 0-15 units insulin  aspart TID with meals and 0-5 units insulin  aspart daily at bedtime). No hyperglycemic events noted, however, suspect use of steroids may increase CBGS.    NUTRITION - FOCUSED PHYSICAL EXAM:  Flowsheet Row Most Recent Value  Orbital Region Mild depletion  Upper Arm Region Severe depletion  Thoracic and Lumbar Region Moderate depletion  Buccal Region Mild depletion  Temple Region Mild depletion  Clavicle Bone Region Severe depletion  Clavicle and Acromion Bone Region Severe depletion  Scapular Bone Region Severe depletion  Dorsal Hand Moderate depletion  Patellar Region Severe depletion  Anterior Thigh Region Severe depletion  Posterior Calf Region Severe depletion  Edema (RD Assessment) None  Hair Reviewed  Eyes Reviewed  Mouth Reviewed  Skin Reviewed  Nails Reviewed    Diet Order:   Diet Order             Diet heart healthy/carb modified Room service appropriate? Yes; Fluid consistency: Thin  Diet effective now                   EDUCATION NEEDS:   Education needs have been addressed  Skin:   Skin Assessment: Reviewed RN Assessment  Last BM:  Unknown  Height:   Ht Readings from Last 1 Encounters:  10/05/24 5' 7 (1.702 m)    Weight:   Wt Readings from Last 1 Encounters:  10/05/24 77.6 kg    Ideal Body Weight:  67.3 kg  BMI:  Body mass index is 26.78 kg/m.  Estimated Nutritional Needs:   Kcal:  2000-2200  Protein:  100-115 grams  Fluid:  2.0-2.2 L    Margery ORN, RD, LDN, CDCES Registered Dietitian III Certified Diabetes Care and Education Specialist If unable to reach this RD, please use RD Inpatient group chat on secure chat between hours of 8am-4 pm daily

## 2024-10-07 DIAGNOSIS — I4891 Unspecified atrial fibrillation: Secondary | ICD-10-CM

## 2024-10-07 DIAGNOSIS — R072 Precordial pain: Secondary | ICD-10-CM | POA: Diagnosis not present

## 2024-10-07 DIAGNOSIS — I2510 Atherosclerotic heart disease of native coronary artery without angina pectoris: Secondary | ICD-10-CM | POA: Diagnosis not present

## 2024-10-07 LAB — GLUCOSE, CAPILLARY
Glucose-Capillary: 133 mg/dL — ABNORMAL HIGH (ref 70–99)
Glucose-Capillary: 190 mg/dL — ABNORMAL HIGH (ref 70–99)
Glucose-Capillary: 197 mg/dL — ABNORMAL HIGH (ref 70–99)
Glucose-Capillary: 144 mg/dL — ABNORMAL HIGH (ref 70–99)

## 2024-10-07 LAB — CBC WITH DIFFERENTIAL/PLATELET
Abs Immature Granulocytes: 0.08 K/uL — ABNORMAL HIGH (ref 0.00–0.07)
Basophils Absolute: 0 K/uL (ref 0.0–0.1)
Basophils Relative: 0 %
Eosinophils Absolute: 0 K/uL (ref 0.0–0.5)
Eosinophils Relative: 0 %
HCT: 45.8 % (ref 39.0–52.0)
Hemoglobin: 15.3 g/dL (ref 13.0–17.0)
Immature Granulocytes: 1 %
Lymphocytes Relative: 7 %
Lymphs Abs: 0.9 K/uL (ref 0.7–4.0)
MCH: 30.5 pg (ref 26.0–34.0)
MCHC: 33.4 g/dL (ref 30.0–36.0)
MCV: 91.2 fL (ref 80.0–100.0)
Monocytes Absolute: 1.2 K/uL — ABNORMAL HIGH (ref 0.1–1.0)
Monocytes Relative: 10 %
Neutro Abs: 9.9 K/uL — ABNORMAL HIGH (ref 1.7–7.7)
Neutrophils Relative %: 82 %
Platelets: 166 K/uL (ref 150–400)
RBC: 5.02 MIL/uL (ref 4.22–5.81)
RDW: 14.9 % (ref 11.5–15.5)
WBC: 12.1 K/uL — ABNORMAL HIGH (ref 4.0–10.5)
nRBC: 0 % (ref 0.0–0.2)

## 2024-10-07 LAB — BASIC METABOLIC PANEL WITH GFR
Anion gap: 4 — ABNORMAL LOW (ref 5–15)
BUN: 24 mg/dL — ABNORMAL HIGH (ref 8–23)
CO2: 29 mmol/L (ref 22–32)
Calcium: 9.2 mg/dL (ref 8.9–10.3)
Chloride: 106 mmol/L (ref 98–111)
Creatinine, Ser: 1.02 mg/dL (ref 0.61–1.24)
GFR, Estimated: >60 mL/min (ref >60)
Glucose, Bld: 142 mg/dL — ABNORMAL HIGH (ref 70–99)
Potassium: 5.5 mmol/L — ABNORMAL HIGH (ref 3.5–5.1)
Sodium: 139 mmol/L (ref 135–145)

## 2024-10-07 LAB — TROPONIN T, HIGH SENSITIVITY
Troponin T High Sensitivity: 17 ng/L (ref 0–19)
Troponin T High Sensitivity: 18 ng/L (ref 0–19)
Troponin T High Sensitivity: 18 ng/L (ref 0–19)

## 2024-10-07 LAB — APTT
aPTT: 125 s — ABNORMAL HIGH (ref 24–36)
aPTT: 75 s — ABNORMAL HIGH (ref 24–36)
aPTT: 67 s — ABNORMAL HIGH (ref 24–36)

## 2024-10-07 LAB — CALCIUM, IONIZED: Calcium, Ionized, Serum: 4.6 mg/dL (ref 4.5–5.6)

## 2024-10-07 LAB — HEPARIN LEVEL (UNFRACTIONATED): Heparin Unfractionated: >1.1 [IU]/mL — ABNORMAL HIGH (ref 0.30–0.70)

## 2024-10-07 MED ORDER — MORPHINE SULFATE (PF) 2 MG/ML IV SOLN
2.0000 mg | INTRAVENOUS | Status: DC | PRN
  Administered 2024-10-07 – 2024-10-08 (×5): 2 mg via INTRAVENOUS
  Filled 2024-10-07 (×5): qty 1

## 2024-10-07 MED ORDER — SODIUM ZIRCONIUM CYCLOSILICATE 10 G PO PACK
10.0000 g | PACK | Freq: Once | ORAL | Status: AC
  Administered 2024-10-07: 10 g via ORAL
  Filled 2024-10-07: qty 1

## 2024-10-07 MED ORDER — SPIRONOLACTONE 12.5 MG HALF TABLET
12.5000 mg | ORAL_TABLET | Freq: Every day | ORAL | Status: DC
  Filled 2024-10-07 (×2): qty 1

## 2024-10-07 NOTE — Plan of Care (Signed)
  Problem: Education: Goal: Ability to describe self-care measures that may prevent or decrease complications (Diabetes Survival Skills Education) will improve Outcome: Progressing Goal: Individualized Educational Video(s) Outcome: Progressing   Problem: Coping: Goal: Ability to adjust to condition or change in health will improve Outcome: Progressing   Problem: Fluid Volume: Goal: Ability to maintain a balanced intake and output will improve Outcome: Progressing   Problem: Health Behavior/Discharge Planning: Goal: Ability to identify and utilize available resources and services will improve Outcome: Progressing Goal: Ability to manage health-related needs will improve Outcome: Progressing   Problem: Metabolic: Goal: Ability to maintain appropriate glucose levels will improve Outcome: Progressing   Problem: Nutritional: Goal: Maintenance of adequate nutrition will improve Outcome: Progressing Goal: Progress toward achieving an optimal weight will improve Outcome: Progressing   Problem: Skin Integrity: Goal: Risk for impaired skin integrity will decrease Outcome: Progressing   Problem: Tissue Perfusion: Goal: Adequacy of tissue perfusion will improve Outcome: Progressing   Problem: Education: Goal: Understanding of cardiac disease, CV risk reduction, and recovery process will improve Outcome: Progressing Goal: Individualized Educational Video(s) Outcome: Progressing   Problem: Activity: Goal: Ability to tolerate increased activity will improve Outcome: Progressing   Problem: Cardiac: Goal: Ability to achieve and maintain adequate cardiovascular perfusion will improve Outcome: Progressing   Problem: Health Behavior/Discharge Planning: Goal: Ability to safely manage health-related needs after discharge will improve Outcome: Progressing   Problem: Education: Goal: Knowledge of General Education information will improve Description: Including pain rating scale,  medication(s)/side effects and non-pharmacologic comfort measures Outcome: Progressing   Problem: Health Behavior/Discharge Planning: Goal: Ability to manage health-related needs will improve Outcome: Progressing   Problem: Clinical Measurements: Goal: Ability to maintain clinical measurements within normal limits will improve Outcome: Progressing Goal: Will remain free from infection Outcome: Progressing Goal: Diagnostic test results will improve Outcome: Progressing Goal: Respiratory complications will improve Outcome: Progressing Goal: Cardiovascular complication will be avoided Outcome: Progressing   Problem: Activity: Goal: Risk for activity intolerance will decrease Outcome: Progressing   Problem: Nutrition: Goal: Adequate nutrition will be maintained Outcome: Progressing   Problem: Coping: Goal: Level of anxiety will decrease Outcome: Progressing   Problem: Elimination: Goal: Will not experience complications related to bowel motility Outcome: Progressing Goal: Will not experience complications related to urinary retention Outcome: Progressing   Problem: Pain Managment: Goal: General experience of comfort will improve and/or be controlled Outcome: Progressing   Problem: Safety: Goal: Ability to remain free from injury will improve Outcome: Progressing   Problem: Skin Integrity: Goal: Risk for impaired skin integrity will decrease Outcome: Progressing   Problem: Education: Goal: Knowledge of disease or condition will improve Outcome: Progressing Goal: Knowledge of the prescribed therapeutic regimen will improve Outcome: Progressing Goal: Individualized Educational Video(s) Outcome: Progressing   Problem: Activity: Goal: Ability to tolerate increased activity will improve Outcome: Progressing Goal: Will verbalize the importance of balancing activity with adequate rest periods Outcome: Progressing   Problem: Respiratory: Goal: Ability to maintain a  clear airway will improve Outcome: Progressing Goal: Levels of oxygenation will improve Outcome: Progressing Goal: Ability to maintain adequate ventilation will improve Outcome: Progressing

## 2024-10-07 NOTE — TOC Progression Note (Addendum)
 Transition of Care [facility]) - Progression Note    Patient Details  Name: Lawrence [name] MRN: [id number] Date of Birth: 1956-04-17  [facility] CM/SW Contact  Taevon Aschoff E Merrie Epler, LCSW Phone Number: 10/07/2024, 10:51 AM  Clinical Narrative:    PT recommendations for RW and HH. CSW spoke with patient, he declines a RW due to cost- states he will use his rollator. Patient agreeable to Home Health, used [facility] in the past - CSW accepted [facility] in [place] and notified [employer]. (They have already accepted the patient for [facility]).  Patient states his main concern is needing to reapply for Medicaid - CSW sent referral to [employer].                     Expected Discharge Plan and Services                                               Social Drivers of Health (SDOH) Interventions SDOH Screenings   Food Insecurity: Food Insecurity Present (10/06/2024)  Housing: Low Risk  (10/06/2024)  Transportation Needs: No Transportation Needs (10/06/2024)  Utilities: Not At Risk (10/06/2024)  Alcohol Screen: Low Risk  (03/14/2024)  Depression (PHQ2-9): Low Risk  (09/15/2024)  Recent Concern: Depression (PHQ2-9) - High Risk (08/11/2024)  Financial Resource Strain: Low Risk (06/19/2024)   Received from [facility]  Physical Activity: Insufficiently Active (03/14/2024)  Social Connections: Socially Isolated (10/06/2024)  Stress: No Stress Concern Present (03/14/2024)  Recent Concern: Stress - Stress Concern Present (01/13/2024)  Tobacco Use: Medium Risk (10/05/2024)  Health Literacy: Adequate Health Literacy (03/14/2024)    Readmission Risk Interventions    09/30/2023   10:28 AM 08/05/2023   10:11 AM 07/13/2023   10:52 AM  Readmission Risk Prevention Plan  Transportation Screening Complete Complete Complete  Medication Review (RN Care Manager) Complete Complete Complete  PCP or Specialist appointment within 3-5 days of discharge Complete Complete  Complete  SW Recovery Care/Counseling Consult Complete Complete Complete  Palliative Care Screening Not Applicable Not Applicable Not Applicable  Skilled Nursing Facility Not Applicable Not Applicable Not Applicable

## 2024-10-07 NOTE — Consult Note (Signed)
 PHARMACY - ANTICOAGULATION CONSULT NOTE  Pharmacy Consult for Heparin  Indication: atrial fibrillation  Allergies  Allergen Reactions   Demerol  [Meperidine Hcl]    Demerol [Meperidine] Hives   Morphine      Pill form not tolerated. Can have fluid form   Sulfa Antibiotics Hives   Albuterol  Other (See Comments) and Palpitations    Pt currently uses this medication.   Empagliflozin  Other (See Comments) and Hives    Perineal pain  Other Reaction(s): Other (See Comments)   Morphine  Sulfate Nausea And Vomiting, Rash and Other (See Comments)    Pt states that he is only allergic to the tablet form of this medication.      Patient Measurements: Height: 5' 7 (170.2 cm) Weight: 77.6 kg (171 lb) IBW/kg (Calculated) : 66.1 HEPARIN  DW (KG): 77.6  Vital Signs: Temp: 97.8 F (36.6 C) (11/22 0402) BP: 103/68 (11/22 0402) Pulse Rate: 91 (11/22 0402)  Labs: Recent Labs    10/05/24 1750 10/06/24 0411 10/07/24 0556  HGB 16.4  --  15.3  HCT 48.9  --  45.8  PLT 189  --  166  APTT  --   --  125*  LABPROT 17.2*  --   --   INR 1.3*  --   --   HEPARINUNFRC  --   --  >1.10*  CREATININE 0.80 1.72*  --     Estimated Creatinine Clearance: 37.4 mL/min (A) (by C-G formula based on SCr of 1.72 mg/dL (H)).   Medical History: Past Medical History:  Diagnosis Date   A-fib (HCC)    Anemia    Anginal pain    Anxiety    Arthritis    Asthma    Atrial fibrillation, chronic (HCC) 10/19/2023   CAD (coronary artery disease)    a. 2002 CABGx2 (LIMA->LAD, VG->VG->OM1);  b. 09/2012 DES->OM;  c. 03/2015 PTCA of LAD [facility]) in setting of atretic LIMA; d. 05/2015 Cath [facility]): nonobs dzs; e. 06/2015 Cath ([facility]): LM nl, LAD 45p/d ISR, 50d, D1/2 small, LCX 50p/d ISR, OM1 70ost, 30 ISR, VG->OM1 50ost, 83m, LIMA->LAD 99p/d - atretic, RCA dom, nl; f.cath 10/16: 40-50%(FFR 0.90) pLAD, 75% (FFR 0.77) mLAD s/p PCI/DES, oRCA 40% (FFR0.95)   Cancer (HCC)    SKIN CANCER ON BACK   Celiac disease    Chronic diastolic  CHF (congestive heart failure) (HCC)    a. 06/2009 Echo: EF 60-65%, Gr 1 DD, triv AI, mildly dil LA, nl RV.   COPD (chronic obstructive pulmonary disease) (HCC)    a. Chronic bronchitis and emphysema.   DDD (degenerative disc disease), lumbar    Diverticulosis    Dysrhythmia    Essential hypertension    GERD (gastroesophageal reflux disease)    History of hiatal hernia    History of kidney stones    H/O   History of tobacco abuse    a. Quit 2014.   Myocardial infarction [facility]) 2002   4 STENTS   Pancreatitis    PSVT (paroxysmal supraventricular tachycardia)    a. 10/2012 Noted on Zio Patch.   RLL pneumonia 05/30/2024   Sleep apnea    LOST CORD TO CPAP -ONLY 02 @ BEDTIME   Tubular adenoma of colon    Type II diabetes mellitus (HCC)     Medications:  Medications Prior to Admission  Medication Sig Dispense Refill Last Dose/Taking   acetaminophen  (TYLENOL ) 325 MG tablet Take 2 tablets (650 mg total) by mouth every 6 (six) hours as needed for mild pain (pain score 1-3)  or fever (or Fever >/= 101). 20 tablet 0 Unknown   albuterol  (PROVENTIL ) (2.5 MG/3ML) 0.083% nebulizer solution Take 3 mLs (2.5 mg total) by nebulization every 6 (six) hours as needed for wheezing or shortness of breath. 150 mL 2 Unknown   allopurinol  (ZYLOPRIM ) 300 MG tablet TAKE 1 TABLET BY MOUTH TWICE A DAY 180 tablet 1 10/05/2024 Morning   ALPRAZolam  (XANAX ) 1 MG tablet TAKE 1 TABLET BY MOUTH 3 TIMES A DAY AS NEEDED 90 tablet 3 10/05/2024 Morning   apixaban  (ELIQUIS ) 5 MG TABS tablet Take 1 tablet (5 mg total) by mouth 2 (two) times daily. 60 tablet 5 10/05/2024 Morning   atorvastatin  (LIPITOR ) 80 MG tablet TAKE 1 TABLET BY MOUTH AT BEDTIME 90 tablet 4 10/04/2024 Evening   BREZTRI  AEROSPHERE 160-9-4.8 MCG/ACT AERO inhaler INHALE 2 PUFFS BY MOUTH INTO THE LUNGS 2TIMES DAILY 10.7 g 11 10/05/2024 Morning   cetirizine  (ZYRTEC ) 10 MG tablet Take 1 tablet (10 mg total) by mouth at bedtime. 90 tablet 1 10/04/2024 Evening    cholecalciferol  (VITAMIN D3) 25 MCG (1000 UNIT) tablet Take 1,000 Units by mouth daily.   10/05/2024 Morning   Cyanocobalamin  (VITAMIN B-12) 1000 MCG SUBL Place 1 tablet under the tongue daily.   10/05/2024 Morning   dapagliflozin  propanediol (FARXIGA ) 10 MG TABS tablet TAKE 1 TABLET BY MOUTH DAILY 30 tablet 5 10/05/2024 Morning   famotidine  (PEPCID ) 20 MG tablet TAKE 1 TABLET BY MOUTH TWICE A DAY 60 tablet 2 10/05/2024 Morning   iron  polysaccharides (FERREX 150) 150 MG capsule Take one capsul every Monday, Wednesday, and Friday   10/04/2024   isosorbide  mononitrate (IMDUR ) 120 MG 24 hr tablet Take 1 tablet (120 mg total) by mouth daily. 30 tablet 11 10/05/2024 Morning   metFORMIN  (GLUCOPHAGE -XR) 500 MG 24 hr tablet TAKE 1 TABLET BY MOUTH DAILY WITH EVENING MEAL 60 tablet 3 10/04/2024 Evening   methocarbamol  (ROBAXIN ) 500 MG tablet TAKE 1 TABLET BY MOUTH EVERY 8 HOURS AS NEEDED FOR MUSCLE SPASMS 90 tablet 3 10/05/2024 Morning   metoprolol  succinate (TOPROL -XL) 25 MG 24 hr tablet TAKE 1 TABLET BY MOUTH DAILY 90 tablet 1 10/05/2024 Morning   nitroGLYCERIN  (NITROSTAT ) 0.4 MG SL tablet Place 1 tablet (0.4 mg total) under the tongue every 5 (five) minutes as needed for chest pain (Do not take if blood pressure is low, systolic BP<110).   Unknown   omega-3 acid ethyl esters (LOVAZA ) 1 g capsule TAKE 4 CAPSULES (4 GRAMS TOTAL) BY MOUTHDAILY 120 capsule 5 10/05/2024 Morning   ondansetron  (ZOFRAN ) 4 MG tablet Take 1 tablet (4 mg total) by mouth every 8 (eight) hours as needed for nausea or vomiting. 10 tablet 0 Unknown   oxyCODONE -acetaminophen  (PERCOCET) 10-325 MG tablet TAKE 1 TABLET BY MOUTH EVERY 4 HOURS AS NEEDED FOR PAIN 180 tablet 0 10/05/2024 at  2:00 PM   pantoprazole  (PROTONIX ) 40 MG tablet TAKE 1 TABLET BY MOUTH 2 TIMES DAILY (30MINUTES BEFORE A MEAL) 60 tablet 0 10/05/2024 Morning   predniSONE  (DELTASONE ) 20 MG tablet Take 1 tablet (20 mg total) by mouth 2 (two) times daily with a meal for 5 days.  10 tablet 0 10/05/2024 Morning   ranolazine  (RANEXA ) 1000 MG SR tablet Take 1 tablet (1,000 mg total) by mouth 2 (two) times daily. 60 tablet 0 10/05/2024 Morning   sacubitril -valsartan  (ENTRESTO ) 49-51 MG Take 1 tablet by mouth 2 (two) times daily. 60 tablet 1 10/05/2024 Morning   spironolactone  (ALDACTONE ) 25 MG tablet Take 1 tablet (25 mg total) by  mouth daily.   10/05/2024 Morning   traZODone  (DESYREL ) 150 MG tablet TAKE 1 TABLET BY MOUTH AT BEDTIME AS NEEDED FOR SLEEP. 30 tablet 11 10/04/2024 Evening   Accu-Chek Softclix Lancets lancets Use as instructed to check sugar daily for type 2 diabetes. 100 each 4    Blood Glucose Monitoring Suppl (ACCU-CHEK GUIDE) w/Device KIT Use to check blood sugars as directed 1 kit 0    Blood Pressure Monitor DEVI Use to check blood pressure daily 1 each 0    glucose blood (ACCU-CHEK GUIDE) test strip Use as instructed to check sugar daily for type 2 diabetes. 100 strip 4    lithium  carbonate 150 MG capsule Take 150 mg by mouth 2 (two) times daily. (Patient not taking: Reported on 08/14/2024)      OXYGEN  Inhale 2 L into the lungs at bedtime as needed (for shortness of breath).      Scheduled:   allopurinol   300 mg Oral BID   atorvastatin   80 mg Oral QHS   dapagliflozin  propanediol  10 mg Oral Daily   famotidine   20 mg Oral BID   feeding supplement  237 mL Oral BID BM   guaiFENesin   1,200 mg Oral BID   insulin  aspart  0-15 Units Subcutaneous TID WC   insulin  aspart  0-5 Units Subcutaneous QHS   isosorbide  mononitrate  120 mg Oral Daily   multivitamin with minerals  1 tablet Oral Daily   omega-3 acid ethyl esters  2 g Oral BID   pantoprazole   40 mg Oral Daily   predniSONE   40 mg Oral Q breakfast   ranolazine   1,000 mg Oral BID   sacubitril -valsartan   1 tablet Oral BID   spironolactone   25 mg Oral Daily   Infusions:   sodium chloride  100 mL/hr at 10/06/24 1110   heparin  1,050 Units/hr (10/06/24 2141)   PRN: acetaminophen , ALPRAZolam , levalbuterol ,  methocarbamol , nitroGLYCERIN , ondansetron  (ZOFRAN ) IV, mouth rinse, oxyCODONE -acetaminophen  **AND** oxyCODONE , traZODone  Anti-infectives (From admission, onward)    Start     Dose/Rate Route Frequency Ordered Stop   10/05/24 1845  azithromycin  (ZITHROMAX ) tablet 500 mg        500 mg Oral  Once 10/05/24 1840 10/05/24 1908       Assessment: 68 y.o. male with medical history significant for CAD (s/p of stent x 4/CABG 2016), A-fib on Eliquis , HFrEF (EF 35 to 40%, 07/2024), PSVT/frequent PVCs, HTN,DM, COPD on 2L O2, OSA not using CPAP, multiple prior hospitalizations related to chest pain/CHF/COPD, most recently 6 weeks ago (10/5 to 08/22/2024, [facility]), being admitted with chest pain and elevated troponin in the setting of a COPD exacerbation. Pharmacy consulted to start heparin  for afib. Apixaban  will be discontinued. CBC stable. Will use aPTT to monitor heparin  infusion.   Goal of Therapy:  Heparin  level 0.3-0.7 units/ml once heparin  level and aPTT correlate.  aPTT 66-102 seconds Monitor platelets by anticoagulation protocol: Yes   Plan:  11/22 @ 0556:  aPTT = 125,  HL = > 1.10 - aPTT elevated,   HL falsely elevated from Eliquis  PTA  - will decrease heparin  drip rate to 900 units/hr and recheck aPTT 6 hrs after rate change - will recheck HL on 11/23 with AM labs  - Continue to monitor H&H and platelets - Switch to HL monitoring once HL and aPTT correlate.   Elleah Hemsley D, PharmD 10/07/2024,6:30 AM

## 2024-10-07 NOTE — Progress Notes (Signed)
 SUBJECTIVE: Patient continues to have intermittent chest pain.  His initial 3 sets of cardiac enzymes were unremarkable.  Repeat EKG and troponin done today were unremarkable.  He says he did not get relief from Percocet.   Vitals:   10/07/24 0031 10/07/24 0402 10/07/24 0833 10/07/24 1153  BP: 103/65 103/68 118/78 114/82  Pulse: 96 91 (!) 52 87  Resp: 20 18    Temp: (!) 97.4 F (36.3 C) 97.8 F (36.6 C) 98.1 F (36.7 C) 98 F (36.7 C)  TempSrc:      SpO2: 96% 100% 99% 100%  Weight:      Height:        Intake/Output Summary (Last 24 hours) at 10/07/2024 1352 Last data filed at 10/07/2024 1247 Gross per 24 hour  Intake 3306 ml  Output 2225 ml  Net 1081 ml    LABS: Basic Metabolic Panel: Recent Labs    10/06/24 0411 10/07/24 0556  NA 134* 139  K 4.5 5.5*  CL 94* 106  CO2 22 29  GLUCOSE 376* 142*  BUN 33* 24*  CREATININE 1.72* 1.02  CALCIUM  9.5 9.2  MG 2.3  --    Liver Function Tests: Recent Labs    10/06/24 0411  AST 17  ALT 10  ALKPHOS 71  BILITOT 0.7  PROT 6.5  ALBUMIN 4.1   No results for input(s): LIPASE, AMYLASE in the last 72 hours. CBC: Recent Labs    10/05/24 1750 10/07/24 0556  WBC 7.4 12.1*  NEUTROABS  --  9.9*  HGB 16.4 15.3  HCT 48.9 45.8  MCV 89.7 91.2  PLT 189 166   Cardiac Enzymes: No results for input(s): CKTOTAL, CKMB, CKMBINDEX, TROPONINI in the last 72 hours. BNP: Invalid input(s): POCBNP D-Dimer: Recent Labs    10/05/24 1750  DDIMER 0.28   Hemoglobin A1C: No results for input(s): HGBA1C in the last 72 hours. Fasting Lipid Panel: No results for input(s): CHOL, HDL, LDLCALC, TRIG, CHOLHDL, LDLDIRECT in the last 72 hours. Thyroid  Function Tests: No results for input(s): TSH, T4TOTAL, T3FREE, THYROIDAB in the last 72 hours.  Invalid input(s): FREET3 Anemia Panel: No results for input(s): VITAMINB12, FOLATE, FERRITIN, TIBC, IRON , RETICCTPCT in the last 72  hours.   PHYSICAL EXAM General: Well developed, well nourished, in no acute distress HEENT:  Normocephalic and atramatic Neck:  No JVD.  Lungs: Clear bilaterally to auscultation and percussion. Heart: HRRR . Normal S1 and S2 without gallops or murmurs.  Abdomen: Bowel sounds are positive, abdomen soft and non-tender  Msk:  Back normal, normal gait. Normal strength and tone for age. Extremities: No clubbing, cyanosis or edema.   Neuro: Alert and oriented X 3. Psych:  Good affect, responds appropriately  TELEMETRY: Atrial fibrillation controlled ventricular rate  ASSESSMENT AND PLAN: Chest pain with history of CABG and PCI multiple times with recurrent chest pain without any EKG changes and unremarkable troponin so far.  Advise continuing patient on heparin .  Discussed with the patient that at this time there is no indication for cardiac catheterization because of troponins being negative however if he continues to have symptoms we will decide whether to schedule the cardiac cath on Monday will not tomorrow.   ICD-10-CM   1. COPD exacerbation (HCC)  J44.1     2. Elevated troponin  R79.89     3. Atypical chest pain  R07.89     4. Moderate risk chest pain  R07.9       Principal Problem:   Substernal precordial chest  pain Active Problems:   Depression with anxiety   OSA (obstructive sleep apnea)   Chronic respiratory failure with hypoxia (HCC)   Elevated troponin   Chronic HFrEF (heart failure with reduced ejection fraction) (HCC)   Hx of CABG   Type 2 diabetes mellitus without complications (HCC)   PAD (peripheral artery disease)   Atrial fibrillation, chronic (HCC)   CAD S/P percutaneous coronary angioplasty   Hypotension   COPD with acute exacerbation (HCC)   Hypocalcemia   Protein-calorie malnutrition, severe    Lawrence Bathe, MD, FACC 10/07/2024 1:52 PM

## 2024-10-07 NOTE — Consult Note (Signed)
 PHARMACY - ANTICOAGULATION CONSULT NOTE  Pharmacy Consult for Heparin  Indication: atrial fibrillation  Allergies  Allergen Reactions   Demerol  [Meperidine Hcl]    Demerol [Meperidine] Hives   Morphine      Pill form not tolerated. Can have fluid form   Sulfa Antibiotics Hives   Albuterol  Other (See Comments) and Palpitations    Pt currently uses this medication.   Empagliflozin  Other (See Comments) and Hives    Perineal pain  Other Reaction(s): Other (See Comments)   Morphine  Sulfate Nausea And Vomiting, Rash and Other (See Comments)    Pt states that he is only allergic to the tablet form of this medication.      Patient Measurements: Height: 5' 7 (170.2 cm) Weight: 77.6 kg (171 lb) IBW/kg (Calculated) : 66.1 HEPARIN  DW (KG): 77.6  Vital Signs: Temp: 97.4 F (36.3 C) (11/22 2027) BP: 123/85 (11/22 2027) Pulse Rate: 103 (11/22 2027)  Labs: Recent Labs    10/05/24 1750 10/06/24 0411 10/07/24 0556 10/07/24 1301 10/07/24 1929  HGB 16.4  --  15.3  --   --   HCT 48.9  --  45.8  --   --   PLT 189  --  166  --   --   APTT  --   --  125* 75* 67*  LABPROT 17.2*  --   --   --   --   INR 1.3*  --   --   --   --   HEPARINUNFRC  --   --  >1.10*  --   --   CREATININE 0.80 1.72* 1.02  --   --     Estimated Creatinine Clearance: 63 mL/min (by C-G formula based on SCr of 1.02 mg/dL).   Medical History: Past Medical History:  Diagnosis Date   A-fib (HCC)    Anemia    Anginal pain    Anxiety    Arthritis    Asthma    Atrial fibrillation, chronic (HCC) 10/19/2023   CAD (coronary artery disease)    a. 2002 CABGx2 (LIMA->LAD, VG->VG->OM1);  b. 09/2012 DES->OM;  c. 03/2015 PTCA of LAD [facility]) in setting of atretic LIMA; d. 05/2015 Cath [facility]): nonobs dzs; e. 06/2015 Cath ([facility]): LM nl, LAD 45p/d ISR, 50d, D1/2 small, LCX 50p/d ISR, OM1 70ost, 30 ISR, VG->OM1 50ost, 62m, LIMA->LAD 99p/d - atretic, RCA dom, nl; f.cath 10/16: 40-50%(FFR 0.90) pLAD, 75% (FFR 0.77) mLAD s/p PCI/DES,  oRCA 40% (FFR0.95)   Cancer (HCC)    SKIN CANCER ON BACK   Celiac disease    Chronic diastolic CHF (congestive heart failure) (HCC)    a. 06/2009 Echo: EF 60-65%, Gr 1 DD, triv AI, mildly dil LA, nl RV.   COPD (chronic obstructive pulmonary disease) (HCC)    a. Chronic bronchitis and emphysema.   DDD (degenerative disc disease), lumbar    Diverticulosis    Dysrhythmia    Essential hypertension    GERD (gastroesophageal reflux disease)    History of hiatal hernia    History of kidney stones    H/O   History of tobacco abuse    a. Quit 2014.   Myocardial infarction [facility]) 2002   4 STENTS   Pancreatitis    PSVT (paroxysmal supraventricular tachycardia)    a. 10/2012 Noted on Zio Patch.   RLL pneumonia 05/30/2024   Sleep apnea    LOST CORD TO CPAP -ONLY 02 @ BEDTIME   Tubular adenoma of colon    Type II diabetes mellitus (HCC)  Medications:  Medications Prior to Admission  Medication Sig Dispense Refill Last Dose/Taking   acetaminophen  (TYLENOL ) 325 MG tablet Take 2 tablets (650 mg total) by mouth every 6 (six) hours as needed for mild pain (pain score 1-3) or fever (or Fever >/= 101). 20 tablet 0 Unknown   albuterol  (PROVENTIL ) (2.5 MG/3ML) 0.083% nebulizer solution Take 3 mLs (2.5 mg total) by nebulization every 6 (six) hours as needed for wheezing or shortness of breath. 150 mL 2 Unknown   allopurinol  (ZYLOPRIM ) 300 MG tablet TAKE 1 TABLET BY MOUTH TWICE A DAY 180 tablet 1 10/05/2024 Morning   ALPRAZolam  (XANAX ) 1 MG tablet TAKE 1 TABLET BY MOUTH 3 TIMES A DAY AS NEEDED 90 tablet 3 10/05/2024 Morning   apixaban  (ELIQUIS ) 5 MG TABS tablet Take 1 tablet (5 mg total) by mouth 2 (two) times daily. 60 tablet 5 10/05/2024 Morning   atorvastatin  (LIPITOR ) 80 MG tablet TAKE 1 TABLET BY MOUTH AT BEDTIME 90 tablet 4 10/04/2024 Evening   BREZTRI  AEROSPHERE 160-9-4.8 MCG/ACT AERO inhaler INHALE 2 PUFFS BY MOUTH INTO THE LUNGS 2TIMES DAILY 10.7 g 11 10/05/2024 Morning   cetirizine  (ZYRTEC )  10 MG tablet Take 1 tablet (10 mg total) by mouth at bedtime. 90 tablet 1 10/04/2024 Evening   cholecalciferol  (VITAMIN D3) 25 MCG (1000 UNIT) tablet Take 1,000 Units by mouth daily.   10/05/2024 Morning   Cyanocobalamin  (VITAMIN B-12) 1000 MCG SUBL Place 1 tablet under the tongue daily.   10/05/2024 Morning   dapagliflozin  propanediol (FARXIGA ) 10 MG TABS tablet TAKE 1 TABLET BY MOUTH DAILY 30 tablet 5 10/05/2024 Morning   famotidine  (PEPCID ) 20 MG tablet TAKE 1 TABLET BY MOUTH TWICE A DAY 60 tablet 2 10/05/2024 Morning   iron  polysaccharides (FERREX 150) 150 MG capsule Take one capsul every Monday, Wednesday, and Friday   10/04/2024   isosorbide  mononitrate (IMDUR ) 120 MG 24 hr tablet Take 1 tablet (120 mg total) by mouth daily. 30 tablet 11 10/05/2024 Morning   metFORMIN  (GLUCOPHAGE -XR) 500 MG 24 hr tablet TAKE 1 TABLET BY MOUTH DAILY WITH EVENING MEAL 60 tablet 3 10/04/2024 Evening   methocarbamol  (ROBAXIN ) 500 MG tablet TAKE 1 TABLET BY MOUTH EVERY 8 HOURS AS NEEDED FOR MUSCLE SPASMS 90 tablet 3 10/05/2024 Morning   metoprolol  succinate (TOPROL -XL) 25 MG 24 hr tablet TAKE 1 TABLET BY MOUTH DAILY 90 tablet 1 10/05/2024 Morning   nitroGLYCERIN  (NITROSTAT ) 0.4 MG SL tablet Place 1 tablet (0.4 mg total) under the tongue every 5 (five) minutes as needed for chest pain (Do not take if blood pressure is low, systolic BP<110).   Unknown   omega-3 acid ethyl esters (LOVAZA ) 1 g capsule TAKE 4 CAPSULES (4 GRAMS TOTAL) BY MOUTHDAILY 120 capsule 5 10/05/2024 Morning   ondansetron  (ZOFRAN ) 4 MG tablet Take 1 tablet (4 mg total) by mouth every 8 (eight) hours as needed for nausea or vomiting. 10 tablet 0 Unknown   oxyCODONE -acetaminophen  (PERCOCET) 10-325 MG tablet TAKE 1 TABLET BY MOUTH EVERY 4 HOURS AS NEEDED FOR PAIN 180 tablet 0 10/05/2024 at  2:00 PM   pantoprazole  (PROTONIX ) 40 MG tablet TAKE 1 TABLET BY MOUTH 2 TIMES DAILY (30MINUTES BEFORE A MEAL) 60 tablet 0 10/05/2024 Morning   predniSONE  (DELTASONE )  20 MG tablet Take 1 tablet (20 mg total) by mouth 2 (two) times daily with a meal for 5 days. 10 tablet 0 10/05/2024 Morning   ranolazine  (RANEXA ) 1000 MG SR tablet Take 1 tablet (1,000 mg total) by mouth 2 (two) times  daily. 60 tablet 0 10/05/2024 Morning   sacubitril -valsartan  (ENTRESTO ) 49-51 MG Take 1 tablet by mouth 2 (two) times daily. 60 tablet 1 10/05/2024 Morning   spironolactone  (ALDACTONE ) 25 MG tablet Take 1 tablet (25 mg total) by mouth daily.   10/05/2024 Morning   traZODone  (DESYREL ) 150 MG tablet TAKE 1 TABLET BY MOUTH AT BEDTIME AS NEEDED FOR SLEEP. 30 tablet 11 10/04/2024 Evening   Accu-Chek Softclix Lancets lancets Use as instructed to check sugar daily for type 2 diabetes. 100 each 4    Blood Glucose Monitoring Suppl (ACCU-CHEK GUIDE) w/Device KIT Use to check blood sugars as directed 1 kit 0    Blood Pressure Monitor DEVI Use to check blood pressure daily 1 each 0    glucose blood (ACCU-CHEK GUIDE) test strip Use as instructed to check sugar daily for type 2 diabetes. 100 strip 4    lithium  carbonate 150 MG capsule Take 150 mg by mouth 2 (two) times daily. (Patient not taking: Reported on 08/14/2024)      OXYGEN  Inhale 2 L into the lungs at bedtime as needed (for shortness of breath).      Scheduled:   allopurinol   300 mg Oral BID   atorvastatin   80 mg Oral QHS   dapagliflozin  propanediol  10 mg Oral Daily   famotidine   20 mg Oral BID   feeding supplement  237 mL Oral BID BM   guaiFENesin   1,200 mg Oral BID   insulin  aspart  0-15 Units Subcutaneous TID WC   insulin  aspart  0-5 Units Subcutaneous QHS   isosorbide  mononitrate  120 mg Oral Daily   multivitamin with minerals  1 tablet Oral Daily   omega-3 acid ethyl esters  2 g Oral BID   pantoprazole   40 mg Oral Daily   predniSONE   40 mg Oral Q breakfast   ranolazine   1,000 mg Oral BID   sacubitril -valsartan   1 tablet Oral BID   [START ON 10/08/2024] spironolactone   12.5 mg Oral Daily   Infusions:   heparin  900  Units/hr (10/07/24 1547)   PRN: acetaminophen , ALPRAZolam , levalbuterol , methocarbamol , morphine  injection, nitroGLYCERIN , ondansetron  (ZOFRAN ) IV, mouth rinse, oxyCODONE -acetaminophen  **AND** oxyCODONE , traZODone  Anti-infectives (From admission, onward)    Start     Dose/Rate Route Frequency Ordered Stop   10/05/24 1845  azithromycin  (ZITHROMAX ) tablet 500 mg        500 mg Oral  Once 10/05/24 1840 10/05/24 1908       Assessment: 68 y.o. male with medical history significant for CAD (s/p of stent x 4/CABG 2016), A-fib on Eliquis , HFrEF (EF 35 to 40%, 07/2024), PSVT/frequent PVCs, HTN,DM, COPD on 2L O2, OSA not using CPAP, multiple prior hospitalizations related to chest pain/CHF/COPD, most recently 6 weeks ago (10/5 to 08/22/2024, [facility]), being admitted with chest pain and elevated troponin in the setting of a COPD exacerbation. Pharmacy consulted to start heparin  for afib. Apixaban  will be discontinued. CBC stable. Will use aPTT to monitor heparin  infusion.   Goal of Therapy:  Heparin  level 0.3-0.7 units/ml once heparin  level and aPTT correlate.  aPTT 66-102 seconds Monitor platelets by anticoagulation protocol: Yes   11/22 @ 1301: aPTT 75, therapeutic x 1  11/22 @ 1929: aPTT 67, therapeutic x 2 but lower end of goal   Plan:  - Given aPTT level is down-trending and close to lower end of goal, will slightly increase rate - Increase current heparin  gtt rate to 1000 units/hr  - Will recheck aPTT in 6 hours  - Will  continue to monitor aPTT until correlation with HL - Will recheck HL on 11/23 with AM labs  - Continue to monitor H&H and platelets daily     Lum Mania, PharmD, BCPS 10/07/2024 8:48 PM

## 2024-10-07 NOTE — Progress Notes (Signed)
 Progress Note   Patient: Lawrence [name] FMW:[id number] DOB: [birth year] DOA: 10/05/2024     1 DOS: the patient was seen and examined on 10/07/2024      Brief hospital course:   From HPI Lawrence [name] is a 68 y.o. male with medical history significant for CAD (s/p of stent x 4/CABG 2016), A-fib on Eliquis , HFrEF (EF 35 to 40%, 07/2024), PSVT/frequent PVCs, HTN,DM, COPD on 2L O2, OSA not using CPAP, multiple prior hospitalizations related to chest pain/CHF/COPD, most recently 6 weeks ago (10/5 to 08/22/2024, [facility]), being admitted with chest pain and elevated troponin in the setting of a COPD exacerbation.  Patient describes a several day history of cough and shortness of breath associated with chest pressure which did not resolve with home breathing treatments.  He has had no fever and chills.  Denies leg pain or swelling.  Due to worsening chest pain he presented to the hospital.  Of note, during his recent admission to [facility], he did have chest pain and was evaluated by cardiology. They believed it chronic and attributed to an upper respiratory tract infection . Troponin was normal. Ischemic workup was not indicated per cardiology.SABRA  His symptoms improved with oxycodone . In the ED, he was mildly tachycardic to 115 with otherwise normal vitals, saturating at 100% on his home flow rate of oxygen  at 2 L. Labs notable for first troponin of 28 CBC normal.  BMP notable for potassium 3.2, bicarb 19 and calcium  6.4. Respiratory viral panel negative EKG showed A-fib at 107 with no other concerning findings suggestive of ischemia. Chest x-ray clear   Patient was treated with Xopenex  (albuterol  intolerant) as well as prednisone  and azithromycin .  He was given a chewable aspirin .  Also given calcium  repletion in the form of calcium  gluconate.   Admission requested high risk chest pain rule out and COPD exacerbation      Assessment and Plan: * Substernal precordial chest pain Elevated troponin History  of CAD s/p PCI x 4 followed by CABG 2016 History of chronic chest pain Still has some chest pain Recently at [facility] with complaints of chest pain but troponin was normal First troponin 28 and EKG nonischemic D-dimer negative Continue to trend troponin Continue home GDMT-metoprolol , Imdur , Ranexa , atorvastatin , apixaban  Cardiology consulted and case discussed We will continue to monitor on telemetry     COPD with acute exacerbation (HCC) Chronic respiratory failure with hypoxia OSA, CPAP intolerant Scheduled and as needed DuoNebs Continue steroid therapy Continue antitussives, flutter valve Continue supplemental oxygen    Hypocalcemia Calcium  was 6.4 in the ED, etiology uncertain Received calcium  gluconate in the ED Will check mag and albumin Continue to monitor and replete   Hypotension History of hypertension Continue to hold antihypertensives given relative hypotension     Atrial fibrillation, chronic (HCC) Mildly elevated rate likely due to to COPD exacerbation Continue apixaban  and metoprolol    Chronic HFrEF (heart failure with reduced ejection fraction) (HCC) Clinically euvolemic Holding GDMT due to hypotension developing following admission, Toprol , Entresto , Aldactone , Farxiga , Imdur    Type 2 diabetes mellitus without complications (HCC) Sliding scale insulin  coverage   PAD (peripheral artery disease) Continue atorvastatin  and apixaban    Depression with anxiety Continue trazodone  and alprazolam      DVT prophylaxis: Apixaban    Consults: [facility] cardiology   Advance Care Planning:   Code Status: Prior    Family Communication: none   Disposition Plan: Back to previous home environment   Subjective:  Patient seen and examined at bedside this  morning Denies nausea vomiting Patient still complains of chest pain Another cycle of troponin by cardiologist came back negative   Physical Exam: Vitals and nursing note reviewed.  Constitutional:      General: He is  not in acute distress. HENT:     Head: Normocephalic and atraumatic.  Cardiovascular:     Rate and Rhythm: Normal rate and regular rhythm.     Heart sounds: Normal heart sounds.  Pulmonary:     Effort: Pulmonary effort is normal.     Breath sounds: Normal breath sounds.  Abdominal:     Palpations: Abdomen is soft.     Tenderness: There is no abdominal tenderness.  Neurological:     Mental Status: Mental status is at baseline.   Vitals:   10/07/24 0031 10/07/24 0402 10/07/24 0833 10/07/24 1153  BP: 103/65 103/68 118/78 114/82  Pulse: 96 91 (!) 52 87  Resp: 20 18    Temp: (!) 97.4 F (36.3 C) 97.8 F (36.6 C) 98.1 F (36.7 C) 98 F (36.7 C)  TempSrc:      SpO2: 96% 100% 99% 100%  Weight:      Height:        Data Reviewed:    Latest Ref Rng & Units 10/07/2024    5:56 AM 10/05/2024    5:50 PM 09/09/2024    7:37 PM  CBC  WBC 4.0 - 10.5 K/uL 12.1  7.4  5.9   Hemoglobin 13.0 - 17.0 g/dL 84.6  83.5  83.2   Hematocrit 39.0 - 52.0 % 45.8  48.9  50.7   Platelets 150 - 400 K/uL 166  189  189        Latest Ref Rng & Units 10/07/2024    5:56 AM 10/06/2024    4:11 AM 10/05/2024    5:50 PM  BMP  Glucose 70 - 99 mg/dL 857  623  77   BUN 8 - 23 mg/dL 24  33  19   Creatinine 0.61 - 1.24 mg/dL 8.97  8.27  9.19   Sodium 135 - 145 mmol/L 139  134  143   Potassium 3.5 - 5.1 mmol/L 5.5  4.5  3.2   Chloride 98 - 111 mmol/L 106  94  110   CO2 22 - 32 mmol/L 29  22  19    Calcium  8.9 - 10.3 mg/dL 9.2  9.5  6.4      Author: Drue ONEIDA Potter, MD 10/07/2024 5:02 PM  For on call review [email or web address].

## 2024-10-07 NOTE — Consult Note (Signed)
 PHARMACY - ANTICOAGULATION CONSULT NOTE  Pharmacy Consult for Heparin  Indication: atrial fibrillation  Allergies  Allergen Reactions   Demerol  [Meperidine Hcl]    Demerol [Meperidine] Hives   Morphine      Pill form not tolerated. Can have fluid form   Sulfa Antibiotics Hives   Albuterol  Other (See Comments) and Palpitations    Pt currently uses this medication.   Empagliflozin  Other (See Comments) and Hives    Perineal pain  Other Reaction(s): Other (See Comments)   Morphine  Sulfate Nausea And Vomiting, Rash and Other (See Comments)    Pt states that he is only allergic to the tablet form of this medication.      Patient Measurements: Height: 5' 7 (170.2 cm) Weight: 77.6 kg (171 lb) IBW/kg (Calculated) : 66.1 HEPARIN  DW (KG): 77.6  Vital Signs: Temp: 98 F (36.7 C) (11/22 1153) BP: 114/82 (11/22 1153) Pulse Rate: 87 (11/22 1153)  Labs: Recent Labs    10/05/24 1750 10/06/24 0411 10/07/24 0556 10/07/24 1301  HGB 16.4  --  15.3  --   HCT 48.9  --  45.8  --   PLT 189  --  166  --   APTT  --   --  125* 75*  LABPROT 17.2*  --   --   --   INR 1.3*  --   --   --   HEPARINUNFRC  --   --  >1.10*  --   CREATININE 0.80 1.72* 1.02  --     Estimated Creatinine Clearance: 63 mL/min (by C-G formula based on SCr of 1.02 mg/dL).   Medical History: Past Medical History:  Diagnosis Date   A-fib (HCC)    Anemia    Anginal pain    Anxiety    Arthritis    Asthma    Atrial fibrillation, chronic (HCC) 10/19/2023   CAD (coronary artery disease)    a. 2002 CABGx2 (LIMA->LAD, VG->VG->OM1);  b. 09/2012 DES->OM;  c. 03/2015 PTCA of LAD [facility]) in setting of atretic LIMA; d. 05/2015 Cath [facility]): nonobs dzs; e. 06/2015 Cath ([facility]): LM nl, LAD 45p/d ISR, 50d, D1/2 small, LCX 50p/d ISR, OM1 70ost, 30 ISR, VG->OM1 50ost, 54m, LIMA->LAD 99p/d - atretic, RCA dom, nl; f.cath 10/16: 40-50%(FFR 0.90) pLAD, 75% (FFR 0.77) mLAD s/p PCI/DES, oRCA 40% (FFR0.95)   Cancer (HCC)    SKIN CANCER ON  BACK   Celiac disease    Chronic diastolic CHF (congestive heart failure) (HCC)    a. 06/2009 Echo: EF 60-65%, Gr 1 DD, triv AI, mildly dil LA, nl RV.   COPD (chronic obstructive pulmonary disease) (HCC)    a. Chronic bronchitis and emphysema.   DDD (degenerative disc disease), lumbar    Diverticulosis    Dysrhythmia    Essential hypertension    GERD (gastroesophageal reflux disease)    History of hiatal hernia    History of kidney stones    H/O   History of tobacco abuse    a. Quit 2014.   Myocardial infarction [facility]) 2002   4 STENTS   Pancreatitis    PSVT (paroxysmal supraventricular tachycardia)    a. 10/2012 Noted on Zio Patch.   RLL pneumonia 05/30/2024   Sleep apnea    LOST CORD TO CPAP -ONLY 02 @ BEDTIME   Tubular adenoma of colon    Type II diabetes mellitus (HCC)     Medications:  Medications Prior to Admission  Medication Sig Dispense Refill Last Dose/Taking   acetaminophen  (TYLENOL ) 325 MG tablet  Take 2 tablets (650 mg total) by mouth every 6 (six) hours as needed for mild pain (pain score 1-3) or fever (or Fever >/= 101). 20 tablet 0 Unknown   albuterol  (PROVENTIL ) (2.5 MG/3ML) 0.083% nebulizer solution Take 3 mLs (2.5 mg total) by nebulization every 6 (six) hours as needed for wheezing or shortness of breath. 150 mL 2 Unknown   allopurinol  (ZYLOPRIM ) 300 MG tablet TAKE 1 TABLET BY MOUTH TWICE A DAY 180 tablet 1 10/05/2024 Morning   ALPRAZolam  (XANAX ) 1 MG tablet TAKE 1 TABLET BY MOUTH 3 TIMES A DAY AS NEEDED 90 tablet 3 10/05/2024 Morning   apixaban  (ELIQUIS ) 5 MG TABS tablet Take 1 tablet (5 mg total) by mouth 2 (two) times daily. 60 tablet 5 10/05/2024 Morning   atorvastatin  (LIPITOR ) 80 MG tablet TAKE 1 TABLET BY MOUTH AT BEDTIME 90 tablet 4 10/04/2024 Evening   BREZTRI  AEROSPHERE 160-9-4.8 MCG/ACT AERO inhaler INHALE 2 PUFFS BY MOUTH INTO THE LUNGS 2TIMES DAILY 10.7 g 11 10/05/2024 Morning   cetirizine  (ZYRTEC ) 10 MG tablet Take 1 tablet (10 mg total) by mouth at  bedtime. 90 tablet 1 10/04/2024 Evening   cholecalciferol  (VITAMIN D3) 25 MCG (1000 UNIT) tablet Take 1,000 Units by mouth daily.   10/05/2024 Morning   Cyanocobalamin  (VITAMIN B-12) 1000 MCG SUBL Place 1 tablet under the tongue daily.   10/05/2024 Morning   dapagliflozin  propanediol (FARXIGA ) 10 MG TABS tablet TAKE 1 TABLET BY MOUTH DAILY 30 tablet 5 10/05/2024 Morning   famotidine  (PEPCID ) 20 MG tablet TAKE 1 TABLET BY MOUTH TWICE A DAY 60 tablet 2 10/05/2024 Morning   iron  polysaccharides (FERREX 150) 150 MG capsule Take one capsul every Monday, Wednesday, and Friday   10/04/2024   isosorbide  mononitrate (IMDUR ) 120 MG 24 hr tablet Take 1 tablet (120 mg total) by mouth daily. 30 tablet 11 10/05/2024 Morning   metFORMIN  (GLUCOPHAGE -XR) 500 MG 24 hr tablet TAKE 1 TABLET BY MOUTH DAILY WITH EVENING MEAL 60 tablet 3 10/04/2024 Evening   methocarbamol  (ROBAXIN ) 500 MG tablet TAKE 1 TABLET BY MOUTH EVERY 8 HOURS AS NEEDED FOR MUSCLE SPASMS 90 tablet 3 10/05/2024 Morning   metoprolol  succinate (TOPROL -XL) 25 MG 24 hr tablet TAKE 1 TABLET BY MOUTH DAILY 90 tablet 1 10/05/2024 Morning   nitroGLYCERIN  (NITROSTAT ) 0.4 MG SL tablet Place 1 tablet (0.4 mg total) under the tongue every 5 (five) minutes as needed for chest pain (Do not take if blood pressure is low, systolic BP<110).   Unknown   omega-3 acid ethyl esters (LOVAZA ) 1 g capsule TAKE 4 CAPSULES (4 GRAMS TOTAL) BY MOUTHDAILY 120 capsule 5 10/05/2024 Morning   ondansetron  (ZOFRAN ) 4 MG tablet Take 1 tablet (4 mg total) by mouth every 8 (eight) hours as needed for nausea or vomiting. 10 tablet 0 Unknown   oxyCODONE -acetaminophen  (PERCOCET) 10-325 MG tablet TAKE 1 TABLET BY MOUTH EVERY 4 HOURS AS NEEDED FOR PAIN 180 tablet 0 10/05/2024 at  2:00 PM   pantoprazole  (PROTONIX ) 40 MG tablet TAKE 1 TABLET BY MOUTH 2 TIMES DAILY (30MINUTES BEFORE A MEAL) 60 tablet 0 10/05/2024 Morning   predniSONE  (DELTASONE ) 20 MG tablet Take 1 tablet (20 mg total) by mouth 2  (two) times daily with a meal for 5 days. 10 tablet 0 10/05/2024 Morning   ranolazine  (RANEXA ) 1000 MG SR tablet Take 1 tablet (1,000 mg total) by mouth 2 (two) times daily. 60 tablet 0 10/05/2024 Morning   sacubitril -valsartan  (ENTRESTO ) 49-51 MG Take 1 tablet by mouth 2 (two) times  daily. 60 tablet 1 10/05/2024 Morning   spironolactone  (ALDACTONE ) 25 MG tablet Take 1 tablet (25 mg total) by mouth daily.   10/05/2024 Morning   traZODone  (DESYREL ) 150 MG tablet TAKE 1 TABLET BY MOUTH AT BEDTIME AS NEEDED FOR SLEEP. 30 tablet 11 10/04/2024 Evening   Accu-Chek Softclix Lancets lancets Use as instructed to check sugar daily for type 2 diabetes. 100 each 4    Blood Glucose Monitoring Suppl (ACCU-CHEK GUIDE) w/Device KIT Use to check blood sugars as directed 1 kit 0    Blood Pressure Monitor DEVI Use to check blood pressure daily 1 each 0    glucose blood (ACCU-CHEK GUIDE) test strip Use as instructed to check sugar daily for type 2 diabetes. 100 strip 4    lithium  carbonate 150 MG capsule Take 150 mg by mouth 2 (two) times daily. (Patient not taking: Reported on 08/14/2024)      OXYGEN  Inhale 2 L into the lungs at bedtime as needed (for shortness of breath).      Scheduled:   allopurinol   300 mg Oral BID   atorvastatin   80 mg Oral QHS   dapagliflozin  propanediol  10 mg Oral Daily   famotidine   20 mg Oral BID   feeding supplement  237 mL Oral BID BM   guaiFENesin   1,200 mg Oral BID   insulin  aspart  0-15 Units Subcutaneous TID WC   insulin  aspart  0-5 Units Subcutaneous QHS   isosorbide  mononitrate  120 mg Oral Daily   multivitamin with minerals  1 tablet Oral Daily   omega-3 acid ethyl esters  2 g Oral BID   pantoprazole   40 mg Oral Daily   predniSONE   40 mg Oral Q breakfast   ranolazine   1,000 mg Oral BID   sacubitril -valsartan   1 tablet Oral BID   [START ON 10/08/2024] spironolactone   12.5 mg Oral Daily   Infusions:   heparin  900 Units/hr (10/07/24 0933)   PRN: acetaminophen ,  ALPRAZolam , levalbuterol , methocarbamol , nitroGLYCERIN , ondansetron  (ZOFRAN ) IV, mouth rinse, oxyCODONE -acetaminophen  **AND** oxyCODONE , traZODone  Anti-infectives (From admission, onward)    Start     Dose/Rate Route Frequency Ordered Stop   10/05/24 1845  azithromycin  (ZITHROMAX ) tablet 500 mg        500 mg Oral  Once 10/05/24 1840 10/05/24 1908       Assessment: 68 y.o. male with medical history significant for CAD (s/p of stent x 4/CABG 2016), A-fib on Eliquis , HFrEF (EF 35 to 40%, 07/2024), PSVT/frequent PVCs, HTN,DM, COPD on 2L O2, OSA not using CPAP, multiple prior hospitalizations related to chest pain/CHF/COPD, most recently 6 weeks ago (10/5 to 08/22/2024, [facility]), being admitted with chest pain and elevated troponin in the setting of a COPD exacerbation. Pharmacy consulted to start heparin  for afib. Apixaban  will be discontinued. CBC stable. Will use aPTT to monitor heparin  infusion.   Goal of Therapy:  Heparin  level 0.3-0.7 units/ml once heparin  level and aPTT correlate.  aPTT 66-102 seconds Monitor platelets by anticoagulation protocol: Yes   11/22 @ 1301: aPTT 75, therapeutic x 1    Plan:  - Continue current heparin  gtt rate at 900 units/hr  - Will recheck aPTT in 6 hours  - Will continue to monitor aPTT until correlation with HL - Will recheck HL on 11/23 with AM labs  - Continue to monitor H&H and platelets daily     Ransom Blanch PGY-1 Pharmacy Resident  [place] [facility]  10/07/2024 1:37 PM

## 2024-10-07 NOTE — Progress Notes (Signed)
 Mobility Specialist Progress Note:    10/07/24 1100  Mobility  Activity Refused and notified nurse if applicable   RN at bedside, pt kindly refused mobility. Asked this dino to return at a later time, all needs met.  Sherrilee Ditty Mobility Specialist Please contact via Special Educational Needs Teacher or  Rehab office at [phone]

## 2024-10-08 DIAGNOSIS — R072 Precordial pain: Secondary | ICD-10-CM | POA: Diagnosis not present

## 2024-10-08 DIAGNOSIS — I2510 Atherosclerotic heart disease of native coronary artery without angina pectoris: Secondary | ICD-10-CM | POA: Diagnosis not present

## 2024-10-08 LAB — CBC WITH DIFFERENTIAL/PLATELET
Abs Immature Granulocytes: 0.05 K/uL (ref 0.00–0.07)
Basophils Absolute: 0 K/uL (ref 0.0–0.1)
Basophils Relative: 0 %
Eosinophils Absolute: 0 K/uL (ref 0.0–0.5)
Eosinophils Relative: 0 %
HCT: 49.9 % (ref 39.0–52.0)
Hemoglobin: 16.1 g/dL (ref 13.0–17.0)
Immature Granulocytes: 1 %
Lymphocytes Relative: 9 %
Lymphs Abs: 0.8 K/uL (ref 0.7–4.0)
MCH: 29.8 pg (ref 26.0–34.0)
MCHC: 32.3 g/dL (ref 30.0–36.0)
MCV: 92.4 fL (ref 80.0–100.0)
Monocytes Absolute: 0.8 K/uL (ref 0.1–1.0)
Monocytes Relative: 9 %
Neutro Abs: 6.6 K/uL (ref 1.7–7.7)
Neutrophils Relative %: 81 %
Platelets: 162 K/uL (ref 150–400)
RBC: 5.4 MIL/uL (ref 4.22–5.81)
RDW: 15 % (ref 11.5–15.5)
WBC: 8.2 K/uL (ref 4.0–10.5)
nRBC: 0 % (ref 0.0–0.2)

## 2024-10-08 LAB — HEPARIN LEVEL (UNFRACTIONATED): Heparin Unfractionated: 0.72 [IU]/mL — ABNORMAL HIGH (ref 0.30–0.70)

## 2024-10-08 LAB — GLUCOSE, CAPILLARY
Glucose-Capillary: 113 mg/dL — ABNORMAL HIGH (ref 70–99)
Glucose-Capillary: 190 mg/dL — ABNORMAL HIGH (ref 70–99)
Glucose-Capillary: 298 mg/dL — ABNORMAL HIGH (ref 70–99)
Glucose-Capillary: 125 mg/dL — ABNORMAL HIGH (ref 70–99)

## 2024-10-08 LAB — BASIC METABOLIC PANEL WITH GFR
Anion gap: 5 (ref 5–15)
BUN: 21 mg/dL (ref 8–23)
CO2: 32 mmol/L (ref 22–32)
Calcium: 9.6 mg/dL (ref 8.9–10.3)
Chloride: 101 mmol/L (ref 98–111)
Creatinine, Ser: 0.88 mg/dL (ref 0.61–1.24)
GFR, Estimated: >60 mL/min (ref >60)
Glucose, Bld: 131 mg/dL — ABNORMAL HIGH (ref 70–99)
Potassium: 5.4 mmol/L — ABNORMAL HIGH (ref 3.5–5.1)
Sodium: 138 mmol/L (ref 135–145)

## 2024-10-08 LAB — APTT: aPTT: 92 s — ABNORMAL HIGH (ref 24–36)

## 2024-10-08 MED ORDER — SODIUM ZIRCONIUM CYCLOSILICATE 10 G PO PACK
10.0000 g | PACK | Freq: Once | ORAL | Status: AC
  Administered 2024-10-08: 10 g via ORAL
  Filled 2024-10-08: qty 1

## 2024-10-08 MED ORDER — METOPROLOL SUCCINATE ER 25 MG PO TB24
25.0000 mg | ORAL_TABLET | Freq: Every day | ORAL | Status: DC
  Administered 2024-10-08: 25 mg via ORAL
  Filled 2024-10-08: qty 1

## 2024-10-08 NOTE — Plan of Care (Signed)
  Problem: Education: Goal: Ability to describe self-care measures that may prevent or decrease complications (Diabetes Survival Skills Education) will improve Outcome: Progressing Goal: Individualized Educational Video(s) Outcome: Progressing   Problem: Coping: Goal: Ability to adjust to condition or change in health will improve Outcome: Progressing   Problem: Fluid Volume: Goal: Ability to maintain a balanced intake and output will improve Outcome: Progressing   Problem: Health Behavior/Discharge Planning: Goal: Ability to identify and utilize available resources and services will improve Outcome: Progressing Goal: Ability to manage health-related needs will improve Outcome: Progressing   Problem: Metabolic: Goal: Ability to maintain appropriate glucose levels will improve Outcome: Progressing   Problem: Nutritional: Goal: Maintenance of adequate nutrition will improve Outcome: Progressing Goal: Progress toward achieving an optimal weight will improve Outcome: Progressing   Problem: Skin Integrity: Goal: Risk for impaired skin integrity will decrease Outcome: Progressing   Problem: Tissue Perfusion: Goal: Adequacy of tissue perfusion will improve Outcome: Progressing   Problem: Education: Goal: Understanding of cardiac disease, CV risk reduction, and recovery process will improve Outcome: Progressing Goal: Individualized Educational Video(s) Outcome: Progressing   Problem: Activity: Goal: Ability to tolerate increased activity will improve Outcome: Progressing   Problem: Cardiac: Goal: Ability to achieve and maintain adequate cardiovascular perfusion will improve Outcome: Progressing   Problem: Health Behavior/Discharge Planning: Goal: Ability to safely manage health-related needs after discharge will improve Outcome: Progressing   Problem: Education: Goal: Knowledge of General Education information will improve Description: Including pain rating scale,  medication(s)/side effects and non-pharmacologic comfort measures Outcome: Progressing   Problem: Health Behavior/Discharge Planning: Goal: Ability to manage health-related needs will improve Outcome: Progressing   Problem: Clinical Measurements: Goal: Ability to maintain clinical measurements within normal limits will improve Outcome: Progressing Goal: Will remain free from infection Outcome: Progressing Goal: Diagnostic test results will improve Outcome: Progressing Goal: Respiratory complications will improve Outcome: Progressing Goal: Cardiovascular complication will be avoided Outcome: Progressing   Problem: Activity: Goal: Risk for activity intolerance will decrease Outcome: Progressing   Problem: Nutrition: Goal: Adequate nutrition will be maintained Outcome: Progressing   Problem: Coping: Goal: Level of anxiety will decrease Outcome: Progressing   Problem: Elimination: Goal: Will not experience complications related to bowel motility Outcome: Progressing Goal: Will not experience complications related to urinary retention Outcome: Progressing   Problem: Pain Managment: Goal: General experience of comfort will improve and/or be controlled Outcome: Progressing   Problem: Safety: Goal: Ability to remain free from injury will improve Outcome: Progressing   Problem: Skin Integrity: Goal: Risk for impaired skin integrity will decrease Outcome: Progressing   Problem: Education: Goal: Knowledge of disease or condition will improve Outcome: Progressing Goal: Knowledge of the prescribed therapeutic regimen will improve Outcome: Progressing Goal: Individualized Educational Video(s) Outcome: Progressing   Problem: Activity: Goal: Ability to tolerate increased activity will improve Outcome: Progressing Goal: Will verbalize the importance of balancing activity with adequate rest periods Outcome: Progressing   Problem: Respiratory: Goal: Ability to maintain a  clear airway will improve Outcome: Progressing Goal: Levels of oxygenation will improve Outcome: Progressing Goal: Ability to maintain adequate ventilation will improve Outcome: Progressing

## 2024-10-08 NOTE — Consult Note (Signed)
 PHARMACY - ANTICOAGULATION CONSULT NOTE  Pharmacy Consult for Heparin  Indication: atrial fibrillation  Allergies  Allergen Reactions   Demerol  [Meperidine Hcl]    Demerol [Meperidine] Hives   Morphine      Pill form not tolerated. Can have fluid form   Sulfa Antibiotics Hives   Albuterol  Other (See Comments) and Palpitations    Pt currently uses this medication.   Empagliflozin  Other (See Comments) and Hives    Perineal pain  Other Reaction(s): Other (See Comments)   Morphine  Sulfate Nausea And Vomiting, Rash and Other (See Comments)    Pt states that he is only allergic to the tablet form of this medication.      Patient Measurements: Height: 5' 7 (170.2 cm) Weight: 77.6 kg (171 lb) IBW/kg (Calculated) : 66.1 HEPARIN  DW (KG): 77.6  Vital Signs: Temp: 97.7 F (36.5 C) (11/23 0322) BP: 146/93 (11/23 0322) Pulse Rate: 92 (11/23 0322)  Labs: Recent Labs    10/05/24 1750 10/05/24 1750 10/06/24 0411 10/07/24 0556 10/07/24 1301 10/07/24 1929 10/08/24 0447  HGB 16.4  --   --  15.3  --   --  16.1  HCT 48.9  --   --  45.8  --   --  49.9  PLT 189  --   --  166  --   --  162  APTT  --    < >  --  125* 75* 67* 92*  LABPROT 17.2*  --   --   --   --   --   --   INR 1.3*  --   --   --   --   --   --   HEPARINUNFRC  --   --   --  >1.10*  --   --  0.72*  CREATININE 0.80  --  1.72* 1.02  --   --   --    < > = values in this interval not displayed.    Estimated Creatinine Clearance: 63 mL/min (by C-G formula based on SCr of 1.02 mg/dL).   Medical History: Past Medical History:  Diagnosis Date   A-fib (HCC)    Anemia    Anginal pain    Anxiety    Arthritis    Asthma    Atrial fibrillation, chronic (HCC) 10/19/2023   CAD (coronary artery disease)    a. 2002 CABGx2 (LIMA->LAD, VG->VG->OM1);  b. 09/2012 DES->OM;  c. 03/2015 PTCA of LAD [facility]) in setting of atretic LIMA; d. 05/2015 Cath [facility]): nonobs dzs; e. 06/2015 Cath ([facility]): LM nl, LAD 45p/d ISR, 50d, D1/2 small, LCX  50p/d ISR, OM1 70ost, 30 ISR, VG->OM1 50ost, 64m, LIMA->LAD 99p/d - atretic, RCA dom, nl; f.cath 10/16: 40-50%(FFR 0.90) pLAD, 75% (FFR 0.77) mLAD s/p PCI/DES, oRCA 40% (FFR0.95)   Cancer (HCC)    SKIN CANCER ON BACK   Celiac disease    Chronic diastolic CHF (congestive heart failure) (HCC)    a. 06/2009 Echo: EF 60-65%, Gr 1 DD, triv AI, mildly dil LA, nl RV.   COPD (chronic obstructive pulmonary disease) (HCC)    a. Chronic bronchitis and emphysema.   DDD (degenerative disc disease), lumbar    Diverticulosis    Dysrhythmia    Essential hypertension    GERD (gastroesophageal reflux disease)    History of hiatal hernia    History of kidney stones    H/O   History of tobacco abuse    a. Quit 2014.   Myocardial infarction [facility]) 2002   4  STENTS   Pancreatitis    PSVT (paroxysmal supraventricular tachycardia)    a. 10/2012 Noted on Zio Patch.   RLL pneumonia 05/30/2024   Sleep apnea    LOST CORD TO CPAP -ONLY 02 @ BEDTIME   Tubular adenoma of colon    Type II diabetes mellitus (HCC)     Medications:  Medications Prior to Admission  Medication Sig Dispense Refill Last Dose/Taking   acetaminophen  (TYLENOL ) 325 MG tablet Take 2 tablets (650 mg total) by mouth every 6 (six) hours as needed for mild pain (pain score 1-3) or fever (or Fever >/= 101). 20 tablet 0 Unknown   albuterol  (PROVENTIL ) (2.5 MG/3ML) 0.083% nebulizer solution Take 3 mLs (2.5 mg total) by nebulization every 6 (six) hours as needed for wheezing or shortness of breath. 150 mL 2 Unknown   allopurinol  (ZYLOPRIM ) 300 MG tablet TAKE 1 TABLET BY MOUTH TWICE A DAY 180 tablet 1 10/05/2024 Morning   ALPRAZolam  (XANAX ) 1 MG tablet TAKE 1 TABLET BY MOUTH 3 TIMES A DAY AS NEEDED 90 tablet 3 10/05/2024 Morning   apixaban  (ELIQUIS ) 5 MG TABS tablet Take 1 tablet (5 mg total) by mouth 2 (two) times daily. 60 tablet 5 10/05/2024 Morning   atorvastatin  (LIPITOR ) 80 MG tablet TAKE 1 TABLET BY MOUTH AT BEDTIME 90 tablet 4 10/04/2024  Evening   BREZTRI  AEROSPHERE 160-9-4.8 MCG/ACT AERO inhaler INHALE 2 PUFFS BY MOUTH INTO THE LUNGS 2TIMES DAILY 10.7 g 11 10/05/2024 Morning   cetirizine  (ZYRTEC ) 10 MG tablet Take 1 tablet (10 mg total) by mouth at bedtime. 90 tablet 1 10/04/2024 Evening   cholecalciferol  (VITAMIN D3) 25 MCG (1000 UNIT) tablet Take 1,000 Units by mouth daily.   10/05/2024 Morning   Cyanocobalamin  (VITAMIN B-12) 1000 MCG SUBL Place 1 tablet under the tongue daily.   10/05/2024 Morning   dapagliflozin  propanediol (FARXIGA ) 10 MG TABS tablet TAKE 1 TABLET BY MOUTH DAILY 30 tablet 5 10/05/2024 Morning   famotidine  (PEPCID ) 20 MG tablet TAKE 1 TABLET BY MOUTH TWICE A DAY 60 tablet 2 10/05/2024 Morning   iron  polysaccharides (FERREX 150) 150 MG capsule Take one capsul every Monday, Wednesday, and Friday   10/04/2024   isosorbide  mononitrate (IMDUR ) 120 MG 24 hr tablet Take 1 tablet (120 mg total) by mouth daily. 30 tablet 11 10/05/2024 Morning   metFORMIN  (GLUCOPHAGE -XR) 500 MG 24 hr tablet TAKE 1 TABLET BY MOUTH DAILY WITH EVENING MEAL 60 tablet 3 10/04/2024 Evening   methocarbamol  (ROBAXIN ) 500 MG tablet TAKE 1 TABLET BY MOUTH EVERY 8 HOURS AS NEEDED FOR MUSCLE SPASMS 90 tablet 3 10/05/2024 Morning   metoprolol  succinate (TOPROL -XL) 25 MG 24 hr tablet TAKE 1 TABLET BY MOUTH DAILY 90 tablet 1 10/05/2024 Morning   nitroGLYCERIN  (NITROSTAT ) 0.4 MG SL tablet Place 1 tablet (0.4 mg total) under the tongue every 5 (five) minutes as needed for chest pain (Do not take if blood pressure is low, systolic BP<110).   Unknown   omega-3 acid ethyl esters (LOVAZA ) 1 g capsule TAKE 4 CAPSULES (4 GRAMS TOTAL) BY MOUTHDAILY 120 capsule 5 10/05/2024 Morning   ondansetron  (ZOFRAN ) 4 MG tablet Take 1 tablet (4 mg total) by mouth every 8 (eight) hours as needed for nausea or vomiting. 10 tablet 0 Unknown   oxyCODONE -acetaminophen  (PERCOCET) 10-325 MG tablet TAKE 1 TABLET BY MOUTH EVERY 4 HOURS AS NEEDED FOR PAIN 180 tablet 0 10/05/2024 at   2:00 PM   pantoprazole  (PROTONIX ) 40 MG tablet TAKE 1 TABLET BY MOUTH 2 TIMES DAILY (  30MINUTES BEFORE A MEAL) 60 tablet 0 10/05/2024 Morning   predniSONE  (DELTASONE ) 20 MG tablet Take 1 tablet (20 mg total) by mouth 2 (two) times daily with a meal for 5 days. 10 tablet 0 10/05/2024 Morning   ranolazine  (RANEXA ) 1000 MG SR tablet Take 1 tablet (1,000 mg total) by mouth 2 (two) times daily. 60 tablet 0 10/05/2024 Morning   sacubitril -valsartan  (ENTRESTO ) 49-51 MG Take 1 tablet by mouth 2 (two) times daily. 60 tablet 1 10/05/2024 Morning   spironolactone  (ALDACTONE ) 25 MG tablet Take 1 tablet (25 mg total) by mouth daily.   10/05/2024 Morning   traZODone  (DESYREL ) 150 MG tablet TAKE 1 TABLET BY MOUTH AT BEDTIME AS NEEDED FOR SLEEP. 30 tablet 11 10/04/2024 Evening   Accu-Chek Softclix Lancets lancets Use as instructed to check sugar daily for type 2 diabetes. 100 each 4    Blood Glucose Monitoring Suppl (ACCU-CHEK GUIDE) w/Device KIT Use to check blood sugars as directed 1 kit 0    Blood Pressure Monitor DEVI Use to check blood pressure daily 1 each 0    glucose blood (ACCU-CHEK GUIDE) test strip Use as instructed to check sugar daily for type 2 diabetes. 100 strip 4    lithium  carbonate 150 MG capsule Take 150 mg by mouth 2 (two) times daily. (Patient not taking: Reported on 08/14/2024)      OXYGEN  Inhale 2 L into the lungs at bedtime as needed (for shortness of breath).      Scheduled:   allopurinol   300 mg Oral BID   atorvastatin   80 mg Oral QHS   dapagliflozin  propanediol  10 mg Oral Daily   famotidine   20 mg Oral BID   feeding supplement  237 mL Oral BID BM   guaiFENesin   1,200 mg Oral BID   insulin  aspart  0-15 Units Subcutaneous TID WC   insulin  aspart  0-5 Units Subcutaneous QHS   isosorbide  mononitrate  120 mg Oral Daily   multivitamin with minerals  1 tablet Oral Daily   omega-3 acid ethyl esters  2 g Oral BID   pantoprazole   40 mg Oral Daily   predniSONE   40 mg Oral Q breakfast    ranolazine   1,000 mg Oral BID   sacubitril -valsartan   1 tablet Oral BID   spironolactone   12.5 mg Oral Daily   Infusions:   heparin  1,000 Units/hr (10/08/24 0056)   PRN: acetaminophen , ALPRAZolam , levalbuterol , methocarbamol , morphine  injection, nitroGLYCERIN , ondansetron  (ZOFRAN ) IV, mouth rinse, oxyCODONE -acetaminophen  **AND** oxyCODONE , traZODone  Anti-infectives (From admission, onward)    Start     Dose/Rate Route Frequency Ordered Stop   10/05/24 1845  azithromycin  (ZITHROMAX ) tablet 500 mg        500 mg Oral  Once 10/05/24 1840 10/05/24 1908       Assessment: 68 y.o. male with medical history significant for CAD (s/p of stent x 4/CABG 2016), A-fib on Eliquis , HFrEF (EF 35 to 40%, 07/2024), PSVT/frequent PVCs, HTN,DM, COPD on 2L O2, OSA not using CPAP, multiple prior hospitalizations related to chest pain/CHF/COPD, most recently 6 weeks ago (10/5 to 08/22/2024, [facility]), being admitted with chest pain and elevated troponin in the setting of a COPD exacerbation. Pharmacy consulted to start heparin  for afib. Apixaban  will be discontinued. CBC stable. Will use aPTT to monitor heparin  infusion.   Goal of Therapy:  Heparin  level 0.3-0.7 units/ml once heparin  level and aPTT correlate.  aPTT 66-102 seconds Monitor platelets by anticoagulation protocol: Yes   11/22 @ 1301: aPTT 75, therapeutic x 1  11/22 @ 1929: aPTT 67, therapeutic x 2 but lower end of goal 11/23 @ 0447: aPTT 92,  HL 0.72, therapeutic X 3   Plan:  11/23 @ 0447:   aPTT = 92, HL = 0.72 - aPTT therapeutic X 3, HL still falsely elevated from Eliquis  PTA - continue pt on current rate and recheck aPTT and HL on 11/24 with AM labs - Will continue to monitor aPTT until correlation with HL - Continue to monitor H&H and platelets daily     Sheriann Newmann D 10/08/2024 6:08 AM

## 2024-10-08 NOTE — Progress Notes (Addendum)
 SUBJECTIVE: Patient continues to to have chest pain.  Repeated 3 sets of cardiac enzymes yesterday again which were all negative.  It appears patient may have pain medication seeking behavior.   Vitals:   10/08/24 0009 10/08/24 0322 10/08/24 0722 10/08/24 1219  BP: 125/88 (!) 146/93 (!) 154/98 (!) 132/102  Pulse: (!) 43 92 71 81  Resp: 20 17 16 17   Temp: (!) 97.5 F (36.4 C) 97.7 F (36.5 C) (!) 97.4 F (36.3 C) 97.8 F (36.6 C)  TempSrc:      SpO2: 99% 99% 100% 96%  Weight:      Height:        Intake/Output Summary (Last 24 hours) at 10/08/2024 1242 Last data filed at 10/08/2024 1222 Gross per 24 hour  Intake 411.12 ml  Output 4255 ml  Net -3843.88 ml    LABS: Basic Metabolic Panel: Recent Labs    10/06/24 0411 10/07/24 0556 10/08/24 0447  NA 134* 139 138  K 4.5 5.5* 5.4*  CL 94* 106 101  CO2 22 29 32  GLUCOSE 376* 142* 131*  BUN 33* 24* 21  CREATININE 1.72* 1.02 0.88  CALCIUM  9.5 9.2 9.6  MG 2.3  --   --    Liver Function Tests: Recent Labs    10/06/24 0411  AST 17  ALT 10  ALKPHOS 71  BILITOT 0.7  PROT 6.5  ALBUMIN 4.1   No results for input(s): LIPASE, AMYLASE in the last 72 hours. CBC: Recent Labs    10/07/24 0556 10/08/24 0447  WBC 12.1* 8.2  NEUTROABS 9.9* 6.6  HGB 15.3 16.1  HCT 45.8 49.9  MCV 91.2 92.4  PLT 166 162   Cardiac Enzymes: No results for input(s): CKTOTAL, CKMB, CKMBINDEX, TROPONINI in the last 72 hours. BNP: Invalid input(s): POCBNP D-Dimer: Recent Labs    10/05/24 1750  DDIMER 0.28   Hemoglobin A1C: No results for input(s): HGBA1C in the last 72 hours. Fasting Lipid Panel: No results for input(s): CHOL, HDL, LDLCALC, TRIG, CHOLHDL, LDLDIRECT in the last 72 hours. Thyroid  Function Tests: No results for input(s): TSH, T4TOTAL, T3FREE, THYROIDAB in the last 72 hours.  Invalid input(s): FREET3 Anemia Panel: No results for input(s): VITAMINB12, FOLATE, FERRITIN, TIBC,  IRON , RETICCTPCT in the last 72 hours.   PHYSICAL EXAM General: Well developed, well nourished, in no acute distress HEENT:  Normocephalic and atramatic Neck:  No JVD.  Lungs: Clear bilaterally to auscultation and percussion. Heart: HRRR . Normal S1 and S2 without gallops or murmurs.  Abdomen: Bowel sounds are positive, abdomen soft and non-tender  Msk:  Back normal, normal gait. Normal strength and tone for age. Extremities: No clubbing, cyanosis or edema.   Neuro: Alert and oriented X 3. Psych:  Good affect, responds appropriately  TELEMETRY: Atrial fibrillation with controlled ventricular rate.  ASSESSMENT AND PLAN: #1 atypical chest pain with prior history of CABG and multiple PCI, however ruled out for myocardial infarction with repeat 3 sets of troponins.  Patient can be switched back to Eliquis  and discontinue heparin .  Patient was explained that even though he is high risk for coronary event but the chest pain seems to be atypical and most likely noncardiac.  At this time there is no indication for cardiac catheterization. #2 persistent atrial fibrillation, with good rate control.  Advised changing heparin  to Eliquis .  Add metoprolol  succinate 25 mg once a day. #3 HFrEF with left ventricular ejection fraction on echocardiogram 35 to 40%.  Continue Entresto  Farxiga  and Aldactone  as guideline  directed medical therapy.   ICD-10-CM   1. COPD exacerbation (HCC)  J44.1     2. Elevated troponin  R79.89     3. Atypical chest pain  R07.89     4. Moderate risk chest pain  R07.9       Principal Problem:   Substernal precordial chest pain Active Problems:   Depression with anxiety   OSA (obstructive sleep apnea)   Chronic respiratory failure with hypoxia (HCC)   Elevated troponin   Chronic HFrEF (heart failure with reduced ejection fraction) (HCC)   Hx of CABG   Type 2 diabetes mellitus without complications (HCC)   PAD (peripheral artery disease)   Atrial fibrillation,  chronic (HCC)   CAD S/P percutaneous coronary angioplasty   Hypotension   COPD with acute exacerbation (HCC)   Hypocalcemia   Protein-calorie malnutrition, severe    Denyse Bathe, MD, FACC 10/08/2024 12:42 PM

## 2024-10-08 NOTE — Care Management Important Message (Signed)
 Important Message  Patient Details  Name: Lawrence [name] MRN: [id number] Date of Birth: 30-Jul-1956   Important Message Given:  Yes - Medicare IM     [name] 10/08/2024, 5:32 PM

## 2024-10-08 NOTE — Progress Notes (Signed)
 Progress Note   Patient: Lawrence [name] FMW:[id number] DOB: 1956-02-19 DOA: 10/05/2024     2 DOS: the patient was seen and examined on 10/08/2024     Brief [name] course:   From HPI Lawrence [name] is a 68 y.o. male with medical history significant for CAD (s/p of stent x 4/CABG 2016), A-fib on Eliquis , HFrEF (EF 35 to 40%, 07/2024), PSVT/frequent PVCs, HTN,DM, COPD on 2L O2, OSA not using CPAP, multiple prior hospitalizations related to chest pain/CHF/COPD, most recently 6 weeks ago (10/5 to 08/22/2024, [facility]), being admitted with chest pain and elevated troponin in the setting of a COPD exacerbation.  Patient describes a several day history of cough and shortness of breath associated with chest pressure which did not resolve with home breathing treatments.  He has had no fever and chills.  Denies leg pain or swelling.  Due to worsening chest pain he presented to the [name].  Of note, during his recent admission to [facility], he did have chest pain and was evaluated by cardiology. They believed it chronic and attributed to an upper respiratory tract infection . Troponin was normal. Ischemic workup was not indicated per cardiology.SABRA  His symptoms improved with oxycodone . In the ED, he was mildly tachycardic to 115 with otherwise normal vitals, saturating at 100% on his home flow rate of oxygen  at 2 L. Labs notable for first troponin of 28 CBC normal.  BMP notable for potassium 3.2, bicarb 19 and calcium  6.4. Respiratory viral panel negative EKG showed A-fib at 107 with no other concerning findings suggestive of ischemia. Chest x-ray clear   Patient was treated with Xopenex  (albuterol  intolerant) as well as prednisone  and azithromycin .  He was given a chewable aspirin .  Also given calcium  repletion in the form of calcium  gluconate.   Admission requested high risk chest pain rule out and COPD exacerbation      Assessment and Plan: * Substernal precordial chest pain Elevated troponin History  of CAD s/p PCI x 4 followed by CABG 2016 History of chronic chest pain Still has some chest pain Recently at [name] with complaints of chest pain but troponin was normal First troponin 28 and EKG nonischemic D-dimer negative Troponins negative According to cardiologist acute coronary syndrome ruled out Continue home GDMT-metoprolol , Imdur , Ranexa , atorvastatin , apixaban  Cardiology consulted and case discussed We will continue to monitor on telemetry     COPD with acute exacerbation (HCC) Chronic respiratory failure with hypoxia OSA, CPAP intolerant Scheduled and as needed DuoNebs Continue steroid therapy Continue antitussives, flutter valve Continue supplemental oxygen    Hypocalcemia-improved Calcium  was 6.4 in the ED, etiology uncertain Received calcium  gluconate in the ED Continue to monitor and replete   Hypotension History of hypertension Continue to hold antihypertensives given relative hypotension     Atrial fibrillation, chronic (HCC) Mildly elevated rate likely due to to COPD exacerbation Continue apixaban  and metoprolol    Chronic HFrEF (heart failure with reduced ejection fraction) (HCC) Clinically euvolemic Holding GDMT due to hypotension developing following admission, Toprol , Entresto , Aldactone , Farxiga , Imdur    Type 2 diabetes mellitus without complications (HCC) Sliding scale insulin  coverage   PAD (peripheral artery disease) Continue atorvastatin  and apixaban    Depression with anxiety Continue trazodone  and alprazolam      DVT prophylaxis: Apixaban    Consults: Lawrence [name] cardiology   Advance Care Planning:   Code Status: Prior    Family Communication: none   Disposition Plan: Back to previous home environment   Subjective:  Patient seen and examined at bedside this  morning Patient still has some chest pain and appears to be opioid seeking Another cycle of troponin were all negative Chest pain is most likely noncardiac We will discontinue his  morphine    Physical Exam: Vitals and nursing note reviewed.  Constitutional:      General: He is not in acute distress. HENT:     Head: Normocephalic and atraumatic.  Cardiovascular:     Rate and Rhythm: Normal rate and regular rhythm.     Heart sounds: Normal heart sounds.  Pulmonary:     Effort: Pulmonary effort is normal.     Breath sounds: Normal breath sounds.  Abdominal:     Palpations: Abdomen is soft.     Tenderness: There is no abdominal tenderness.  Neurological:     Mental Status: Mental status is at baseline.    Data Reviewed:    Latest Ref Rng & Units 10/08/2024    4:47 AM 10/07/2024    5:56 AM 10/05/2024    5:50 PM  CBC  WBC 4.0 - 10.5 K/uL 8.2  12.1  7.4   Hemoglobin 13.0 - 17.0 g/dL 83.8  84.6  83.5   Hematocrit 39.0 - 52.0 % 49.9  45.8  48.9   Platelets 150 - 400 K/uL 162  166  189        Latest Ref Rng & Units 10/08/2024    4:47 AM 10/07/2024    5:56 AM 10/06/2024    4:11 AM  BMP  Glucose 70 - 99 mg/dL 868  857  623   BUN 8 - 23 mg/dL 21  24  33   Creatinine 0.61 - 1.24 mg/dL 9.11  8.97  8.27   Sodium 135 - 145 mmol/L 138  139  134   Potassium 3.5 - 5.1 mmol/L 5.4  5.5  4.5   Chloride 98 - 111 mmol/L 101  106  94   CO2 22 - 32 mmol/L 32  29  22   Calcium  8.9 - 10.3 mg/dL 9.6  9.2  9.5     Vitals:   10/08/24 0722 10/08/24 1219 10/08/24 1431 10/08/24 1558  BP: (!) 154/98 (!) 132/102 (!) 152/90 (!) 136/90  Pulse: 71 81 (!) 124 (!) 105  Resp: 16 17  16   Temp: (!) 97.4 F (36.3 C) 97.8 F (36.6 C)  97.8 F (36.6 C)  TempSrc:      SpO2: 100% 96%  97%  Weight:      Height:         Author: Drue ONEIDA Potter, MD 10/08/2024 4:55 PM  For on call review [email or web address].

## 2024-10-09 ENCOUNTER — Encounter: Payer: Self-pay | Admitting: Oncology

## 2024-10-09 LAB — CBC WITH DIFFERENTIAL/PLATELET
Abs Immature Granulocytes: 0.05 K/uL (ref 0.00–0.07)
Basophils Absolute: 0 K/uL (ref 0.0–0.1)
Basophils Relative: 0 %
Eosinophils Absolute: 0 K/uL (ref 0.0–0.5)
Eosinophils Relative: 0 %
HCT: 52.2 % — ABNORMAL HIGH (ref 39.0–52.0)
Hemoglobin: 17.2 g/dL — ABNORMAL HIGH (ref 13.0–17.0)
Immature Granulocytes: 1 %
Lymphocytes Relative: 13 %
Lymphs Abs: 0.8 K/uL (ref 0.7–4.0)
MCH: 30.1 pg (ref 26.0–34.0)
MCHC: 33 g/dL (ref 30.0–36.0)
MCV: 91.3 fL (ref 80.0–100.0)
Monocytes Absolute: 0.8 K/uL (ref 0.1–1.0)
Monocytes Relative: 13 %
Neutro Abs: 4.7 K/uL (ref 1.7–7.7)
Neutrophils Relative %: 73 %
Platelets: 160 K/uL (ref 150–400)
RBC: 5.72 MIL/uL (ref 4.22–5.81)
RDW: 15.2 % (ref 11.5–15.5)
WBC: 6.4 K/uL (ref 4.0–10.5)
nRBC: 0 % (ref 0.0–0.2)

## 2024-10-09 LAB — GLUCOSE, CAPILLARY
Glucose-Capillary: 133 mg/dL — ABNORMAL HIGH (ref 70–99)
Glucose-Capillary: 169 mg/dL — ABNORMAL HIGH (ref 70–99)

## 2024-10-09 LAB — BASIC METABOLIC PANEL WITH GFR
Anion gap: 6 (ref 5–15)
BUN: 24 mg/dL — ABNORMAL HIGH (ref 8–23)
CO2: 33 mmol/L — ABNORMAL HIGH (ref 22–32)
Calcium: 9.7 mg/dL (ref 8.9–10.3)
Chloride: 99 mmol/L (ref 98–111)
Creatinine, Ser: 1.07 mg/dL (ref 0.61–1.24)
GFR, Estimated: >60 mL/min (ref >60)
Glucose, Bld: 158 mg/dL — ABNORMAL HIGH (ref 70–99)
Potassium: 5.3 mmol/L — ABNORMAL HIGH (ref 3.5–5.1)
Sodium: 138 mmol/L (ref 135–145)

## 2024-10-09 LAB — APTT: aPTT: 108 s — ABNORMAL HIGH (ref 24–36)

## 2024-10-09 LAB — HEPARIN LEVEL (UNFRACTIONATED): Heparin Unfractionated: 0.65 [IU]/mL (ref 0.30–0.70)

## 2024-10-09 MED ORDER — METOPROLOL SUCCINATE ER 25 MG PO TB24
37.5000 mg | ORAL_TABLET | Freq: Every day | ORAL | Status: DC
  Administered 2024-10-09: 37.5 mg via ORAL
  Filled 2024-10-09: qty 2

## 2024-10-09 MED ORDER — METOPROLOL SUCCINATE ER 25 MG PO TB24: 37.5000 mg | ORAL_TABLET | Freq: Every day | ORAL | 1 refills | Status: DC

## 2024-10-09 MED ORDER — APIXABAN 5 MG PO TABS
5.0000 mg | ORAL_TABLET | Freq: Two times a day (BID) | ORAL | Status: DC
  Administered 2024-10-09: 5 mg via ORAL
  Filled 2024-10-09: qty 1

## 2024-10-09 NOTE — TOC Transition Note (Signed)
 Transition of Care [facility]) - Discharge Note   Patient Details  Name: Lawrence [name] MRN: [id number] Date of Birth: 07-02-1956  [facility] CM/SW Contact:  Alfonso Rummer, LCSW Phone Number: 10/09/2024, 12:18 PM   Clinical Narrative:    Pt will discharge home. [facility] comfirmed home health services are ready to begin service upon medical discharge. Pt provided with taxi voucher at discharge to address on file [address]    Final next level of care: Home w Home Health Services Barriers to Discharge: Barriers Resolved   Patient Goals and CMS Choice            Discharge Placement  Home with home health                 Name of family member notified: pt contacted son via personal cell phone Patient and family notified of of transfer: 10/09/24  Discharge Plan and Services Additional resources added to the After Visit Summary for                            [facility] Arranged: PT, OT HH Agency: Other - See comment nurse, learning disability) Date HH Agency Contacted: 10/09/24   Representative spoke with at [facility] Agency: [facility]  Social Drivers of Health (SDOH) Interventions SDOH Screenings   Food Insecurity: Food Insecurity Present (10/06/2024)  Housing: Low Risk  (10/06/2024)  Transportation Needs: No Transportation Needs (10/06/2024)  Utilities: Not At Risk (10/06/2024)  Alcohol Screen: Low Risk  (03/14/2024)  Depression (PHQ2-9): Low Risk  (09/15/2024)  Recent Concern: Depression (PHQ2-9) - High Risk (08/11/2024)  Financial Resource Strain: Low Risk (06/19/2024)   Received from [facility]  Physical Activity: Insufficiently Active (03/14/2024)  Social Connections: Socially Isolated (10/06/2024)  Stress: No Stress Concern Present (03/14/2024)  Recent Concern: Stress - Stress Concern Present (01/13/2024)  Tobacco Use: Medium Risk (10/05/2024)  Health Literacy: Adequate Health Literacy (03/14/2024)     Readmission Risk Interventions    09/30/2023   10:28  AM 08/05/2023   10:11 AM 07/13/2023   10:52 AM  Readmission Risk Prevention Plan  Transportation Screening Complete Complete Complete  Medication Review (RN Care Manager) Complete Complete Complete  PCP or Specialist appointment within 3-5 days of discharge Complete Complete Complete  SW Recovery Care/Counseling Consult Complete Complete Complete  Palliative Care Screening Not Applicable Not Applicable Not Applicable  Skilled Nursing Facility Not Applicable Not Applicable Not Applicable

## 2024-10-09 NOTE — Progress Notes (Signed)
 [facility] CLINIC CARDIOLOGY PROGRESS NOTE   Patient ID: Lawrence [name] MRN: [id number] DOB/AGE: 12-12-1955 69 y.o.  Admit date: 10/05/2024 Referring Physician Dr. Delayne Solian Primary Physician Gasper, Nancyann BRAVO, MD  Primary Cardiologist Dr. Hilarie Reason for Consultation chest pain  HPI: Lawrence [name] is a 68 y.o. male  with a past medical history of CAD (h/o STEMI s/p CABG x2 LIMA-LAD and SVG-OM1 2002 c/b coronary artery dissection with multiple subsequent PCI and stents), chronic stable angina, HFpEF (50-55%, mild LVH 10/2022) paroxysmal atrial fibrillation s/p unsuccessful DCCV 06/29/2022, COPD (PRN 2L), hx tobacco use, DM2 who presented to the ED on 08/13/2024 for chest pain, SOB. Cardiology was consulted for further evaluation.   Interval History:  -Patient seen and examined this AM, resting in hospital bed.  -States symptoms are stable.  -BP and HR stable.   Review of systems complete and found to be negative unless listed above   Vitals:   10/08/24 2350 10/09/24 0400 10/09/24 0800 10/09/24 0847  BP: 104/74 112/77 119/80 119/80  Pulse: 69 72 84 84  Resp: 18 20 16    Temp: 97.7 F (36.5 C) 98.1 F (36.7 C) 97.7 F (36.5 C)   TempSrc:  Oral    SpO2: 97% 98% 97%   Weight:      Height:         Intake/Output Summary (Last 24 hours) at 10/09/2024 9061 Last data filed at 10/09/2024 0800 Gross per 24 hour  Intake --  Output 3450 ml  Net -3450 ml     PHYSICAL EXAM General: Chronically ill appearing male, well nourished, in no acute distress. HEENT: Normocephalic and atraumatic. Neck: No JVD.  Lungs: Normal respiratory effort on room air. Clear bilaterally to auscultation. No wheezes, crackles, rhonchi.  Heart: Irregularly irregular, controlled rate. Normal S1 and S2 without gallops or murmurs. Radial & DP pulses 2+ bilaterally. Abdomen: Non-distended appearing.  Msk: Normal strength and tone for age. Extremities: No clubbing, cyanosis or edema.   Neuro: Alert and  oriented X 3. Psych: Mood appropriate, affect congruent.    LABS: Basic Metabolic Panel: Recent Labs    10/08/24 0447 10/09/24 0317  NA 138 138  K 5.4* 5.3*  CL 101 99  CO2 32 33*  GLUCOSE 131* 158*  BUN 21 24*  CREATININE 0.88 1.07  CALCIUM  9.6 9.7   Liver Function Tests: No results for input(s): AST, ALT, ALKPHOS, BILITOT, PROT, ALBUMIN in the last 72 hours. No results for input(s): LIPASE, AMYLASE in the last 72 hours. CBC: Recent Labs    10/08/24 0447 10/09/24 0317  WBC 8.2 6.4  NEUTROABS 6.6 4.7  HGB 16.1 17.2*  HCT 49.9 52.2*  MCV 92.4 91.3  PLT 162 160   Cardiac Enzymes: No results for input(s): CKTOTAL, CKMB, CKMBINDEX, TROPONINIHS in the last 72 hours. BNP: No results for input(s): BNP in the last 72 hours. D-Dimer: No results for input(s): DDIMER in the last 72 hours. Hemoglobin A1C: No results for input(s): HGBA1C in the last 72 hours. Fasting Lipid Panel: No results for input(s): CHOL, HDL, LDLCALC, TRIG, CHOLHDL, LDLDIRECT in the last 72 hours. Thyroid  Function Tests: No results for input(s): TSH, T4TOTAL, T3FREE, THYROIDAB in the last 72 hours.  Invalid input(s): FREET3 Anemia Panel: No results for input(s): VITAMINB12, FOLATE, FERRITIN, TIBC, IRON , RETICCTPCT in the last 72 hours.  No results found.   ECHO 07/2024: 1. Left ventricular ejection fraction, by estimation, is 35 to 40%. The left ventricle has moderately decreased function. The left ventricle  demonstrates regional wall motion abnormalities (see scoring diagram/findings for description). There is mild left ventricular hypertrophy. Left ventricular diastolic parameters are indeterminate.   2. Right ventricular systolic function is mildly reduced. The right  ventricular size is mildly enlarged.   3. Left atrial size was mildly dilated.   4. The mitral valve is normal in structure. Trivial mitral valve  regurgitation. No  evidence of mitral stenosis.   5. The aortic valve is tricuspid. There is mild thickening of the aortic valve. Aortic valve regurgitation is mild. Aortic valve sclerosis is present, with no evidence of aortic valve stenosis.   6. There is borderline dilatation of the ascending aorta, measuring 36 mm.   TELEMETRY (personally reviewed): atrial fibrillation rate 90s  EKG (personally reviewed): atrial fibrillation rate 107 bpm  DATA reviewed by me 10/09/24: last 24h vitals tele labs imaging I/O, hospitalist progress note  Principal Problem:   Substernal precordial chest pain Active Problems:   Depression with anxiety   OSA (obstructive sleep apnea)   Chronic respiratory failure with hypoxia (HCC)   Elevated troponin   Chronic HFrEF (heart failure with reduced ejection fraction) (HCC)   Hx of CABG   Type 2 diabetes mellitus without complications (HCC)   PAD (peripheral artery disease)   Atrial fibrillation, chronic (HCC)   CAD S/P percutaneous coronary angioplasty   Hypotension   COPD with acute exacerbation (HCC)   Hypocalcemia   Protein-calorie malnutrition, severe    ASSESSMENT AND PLAN: Lawrence [name] is a 68 y.o. male  with a past medical history of CAD (h/o STEMI s/p CABG x2 LIMA-LAD and SVG-OM1 2002 c/b coronary artery dissection with multiple subsequent PCI and stents), chronic stable angina, HFpEF (50-55%, mild LVH 10/2022) paroxysmal atrial fibrillation s/p unsuccessful DCCV 06/29/2022, COPD (PRN 2L), hx tobacco use, DM2 who presented to the ED on 08/13/2024 for chest pain, SOB. Cardiology was consulted for further evaluation.   # Chest pain # CAD s/p CABG and multiple stents with chronic angina # Chronic HFrEF # Ischemic cardiomyopathy # Persistent atrial fibrillation Patient with a history of chronic angina presented with complaints of chest discomfort which is atypical, SOB. Troponins normal x2. EKG without acute ischemic changes.  - No plan for further cardiac invasive  evaluation at this time given patient's known coronary disease not amenable to intervention and normal troponins. - Increase metoprolol  succinate to 37.5 mg daily. Continue imdur  120 mg daily and ranexa  1000 mg BID. - Continue Farxiga  10 mg daily, Entresto  49-51 mg twice daily.   - Resume Eliquis  5 mg twice daily for stroke risk reduction. - Continue atorvastatin  80 mg daily.  Ok for discharge today from a cardiac perspective. Will arrange for follow up in clinic with Dr. Hilarie in 1-2 weeks.    This patient's case was discussed and created with Dr. Custovic and she is in agreement.  Signed:  Danita Bloch, PA-C  10/09/2024, 9:38 AM [facility] Cardiology

## 2024-10-09 NOTE — Consult Note (Signed)
 PHARMACY - ANTICOAGULATION CONSULT NOTE  Pharmacy Consult for Heparin  Indication: atrial fibrillation  Allergies  Allergen Reactions   Demerol  [Meperidine Hcl]    Demerol [Meperidine] Hives   Morphine      Pill form not tolerated. Can have fluid form   Sulfa Antibiotics Hives   Albuterol  Other (See Comments) and Palpitations    Pt currently uses this medication.   Empagliflozin  Other (See Comments) and Hives    Perineal pain  Other Reaction(s): Other (See Comments)   Morphine  Sulfate Nausea And Vomiting, Rash and Other (See Comments)    Pt states that he is only allergic to the tablet form of this medication.      Patient Measurements: Height: 5' 7 (170.2 cm) Weight: 77.6 kg (171 lb) IBW/kg (Calculated) : 66.1 HEPARIN  DW (KG): 77.6  Vital Signs: Temp: 97.7 F (36.5 C) (11/23 2350) Temp Source: Oral (11/23 2033) BP: 104/74 (11/23 2350) Pulse Rate: 69 (11/23 2350)  Labs: Recent Labs    10/07/24 0556 10/07/24 1301 10/07/24 1929 10/08/24 0447 10/09/24 0317  HGB 15.3  --   --  16.1 17.2*  HCT 45.8  --   --  49.9 52.2*  PLT 166  --   --  162 160  APTT 125*   < > 67* 92* 108*  HEPARINUNFRC >1.10*  --   --  0.72* 0.65  CREATININE 1.02  --   --  0.88  --    < > = values in this interval not displayed.    Estimated Creatinine Clearance: 73 mL/min (by C-G formula based on SCr of 0.88 mg/dL).   Medical History: Past Medical History:  Diagnosis Date   A-fib (HCC)    Anemia    Anginal pain    Anxiety    Arthritis    Asthma    Atrial fibrillation, chronic (HCC) 10/19/2023   CAD (coronary artery disease)    a. 2002 CABGx2 (LIMA->LAD, VG->VG->OM1);  b. 09/2012 DES->OM;  c. 03/2015 PTCA of LAD [facility]) in setting of atretic LIMA; d. 05/2015 Cath [facility]): nonobs dzs; e. 06/2015 Cath ([facility]): LM nl, LAD 45p/d ISR, 50d, D1/2 small, LCX 50p/d ISR, OM1 70ost, 30 ISR, VG->OM1 50ost, 21m, LIMA->LAD 99p/d - atretic, RCA dom, nl; f.cath 10/16: 40-50%(FFR 0.90) pLAD, 75% (FFR 0.77) mLAD  s/p PCI/DES, oRCA 40% (FFR0.95)   Cancer (HCC)    SKIN CANCER ON BACK   Celiac disease    Chronic diastolic CHF (congestive heart failure) (HCC)    a. 06/2009 Echo: EF 60-65%, Gr 1 DD, triv AI, mildly dil LA, nl RV.   COPD (chronic obstructive pulmonary disease) (HCC)    a. Chronic bronchitis and emphysema.   DDD (degenerative disc disease), lumbar    Diverticulosis    Dysrhythmia    Essential hypertension    GERD (gastroesophageal reflux disease)    History of hiatal hernia    History of kidney stones    H/O   History of tobacco abuse    a. Quit 2014.   Myocardial infarction [facility]) 2002   4 STENTS   Pancreatitis    PSVT (paroxysmal supraventricular tachycardia)    a. 10/2012 Noted on Zio Patch.   RLL pneumonia 05/30/2024   Sleep apnea    LOST CORD TO CPAP -ONLY 02 @ BEDTIME   Tubular adenoma of colon    Type II diabetes mellitus (HCC)     Medications:  Medications Prior to Admission  Medication Sig Dispense Refill Last Dose/Taking   acetaminophen  (TYLENOL ) 325 MG  tablet Take 2 tablets (650 mg total) by mouth every 6 (six) hours as needed for mild pain (pain score 1-3) or fever (or Fever >/= 101). 20 tablet 0 Unknown   albuterol  (PROVENTIL ) (2.5 MG/3ML) 0.083% nebulizer solution Take 3 mLs (2.5 mg total) by nebulization every 6 (six) hours as needed for wheezing or shortness of breath. 150 mL 2 Unknown   allopurinol  (ZYLOPRIM ) 300 MG tablet TAKE 1 TABLET BY MOUTH TWICE A DAY 180 tablet 1 10/05/2024 Morning   ALPRAZolam  (XANAX ) 1 MG tablet TAKE 1 TABLET BY MOUTH 3 TIMES A DAY AS NEEDED 90 tablet 3 10/05/2024 Morning   apixaban  (ELIQUIS ) 5 MG TABS tablet Take 1 tablet (5 mg total) by mouth 2 (two) times daily. 60 tablet 5 10/05/2024 Morning   atorvastatin  (LIPITOR ) 80 MG tablet TAKE 1 TABLET BY MOUTH AT BEDTIME 90 tablet 4 10/04/2024 Evening   BREZTRI  AEROSPHERE 160-9-4.8 MCG/ACT AERO inhaler INHALE 2 PUFFS BY MOUTH INTO THE LUNGS 2TIMES DAILY 10.7 g 11 10/05/2024 Morning    cetirizine  (ZYRTEC ) 10 MG tablet Take 1 tablet (10 mg total) by mouth at bedtime. 90 tablet 1 10/04/2024 Evening   cholecalciferol  (VITAMIN D3) 25 MCG (1000 UNIT) tablet Take 1,000 Units by mouth daily.   10/05/2024 Morning   Cyanocobalamin  (VITAMIN B-12) 1000 MCG SUBL Place 1 tablet under the tongue daily.   10/05/2024 Morning   dapagliflozin  propanediol (FARXIGA ) 10 MG TABS tablet TAKE 1 TABLET BY MOUTH DAILY 30 tablet 5 10/05/2024 Morning   famotidine  (PEPCID ) 20 MG tablet TAKE 1 TABLET BY MOUTH TWICE A DAY 60 tablet 2 10/05/2024 Morning   iron  polysaccharides (FERREX 150) 150 MG capsule Take one capsul every Monday, Wednesday, and Friday   10/04/2024   isosorbide  mononitrate (IMDUR ) 120 MG 24 hr tablet Take 1 tablet (120 mg total) by mouth daily. 30 tablet 11 10/05/2024 Morning   metFORMIN  (GLUCOPHAGE -XR) 500 MG 24 hr tablet TAKE 1 TABLET BY MOUTH DAILY WITH EVENING MEAL 60 tablet 3 10/04/2024 Evening   methocarbamol  (ROBAXIN ) 500 MG tablet TAKE 1 TABLET BY MOUTH EVERY 8 HOURS AS NEEDED FOR MUSCLE SPASMS 90 tablet 3 10/05/2024 Morning   metoprolol  succinate (TOPROL -XL) 25 MG 24 hr tablet TAKE 1 TABLET BY MOUTH DAILY 90 tablet 1 10/05/2024 Morning   nitroGLYCERIN  (NITROSTAT ) 0.4 MG SL tablet Place 1 tablet (0.4 mg total) under the tongue every 5 (five) minutes as needed for chest pain (Do not take if blood pressure is low, systolic BP<110).   Unknown   omega-3 acid ethyl esters (LOVAZA ) 1 g capsule TAKE 4 CAPSULES (4 GRAMS TOTAL) BY MOUTHDAILY 120 capsule 5 10/05/2024 Morning   ondansetron  (ZOFRAN ) 4 MG tablet Take 1 tablet (4 mg total) by mouth every 8 (eight) hours as needed for nausea or vomiting. 10 tablet 0 Unknown   oxyCODONE -acetaminophen  (PERCOCET) 10-325 MG tablet TAKE 1 TABLET BY MOUTH EVERY 4 HOURS AS NEEDED FOR PAIN 180 tablet 0 10/05/2024 at  2:00 PM   pantoprazole  (PROTONIX ) 40 MG tablet TAKE 1 TABLET BY MOUTH 2 TIMES DAILY (30MINUTES BEFORE A MEAL) 60 tablet 0 10/05/2024 Morning    predniSONE  (DELTASONE ) 20 MG tablet Take 1 tablet (20 mg total) by mouth 2 (two) times daily with a meal for 5 days. 10 tablet 0 10/05/2024 Morning   ranolazine  (RANEXA ) 1000 MG SR tablet Take 1 tablet (1,000 mg total) by mouth 2 (two) times daily. 60 tablet 0 10/05/2024 Morning   sacubitril -valsartan  (ENTRESTO ) 49-51 MG Take 1 tablet by mouth 2 (two)  times daily. 60 tablet 1 10/05/2024 Morning   spironolactone  (ALDACTONE ) 25 MG tablet Take 1 tablet (25 mg total) by mouth daily.   10/05/2024 Morning   traZODone  (DESYREL ) 150 MG tablet TAKE 1 TABLET BY MOUTH AT BEDTIME AS NEEDED FOR SLEEP. 30 tablet 11 10/04/2024 Evening   Accu-Chek Softclix Lancets lancets Use as instructed to check sugar daily for type 2 diabetes. 100 each 4    Blood Glucose Monitoring Suppl (ACCU-CHEK GUIDE) w/Device KIT Use to check blood sugars as directed 1 kit 0    Blood Pressure Monitor DEVI Use to check blood pressure daily 1 each 0    glucose blood (ACCU-CHEK GUIDE) test strip Use as instructed to check sugar daily for type 2 diabetes. 100 strip 4    lithium  carbonate 150 MG capsule Take 150 mg by mouth 2 (two) times daily. (Patient not taking: Reported on 08/14/2024)      OXYGEN  Inhale 2 L into the lungs at bedtime as needed (for shortness of breath).      Scheduled:   allopurinol   300 mg Oral BID   atorvastatin   80 mg Oral QHS   dapagliflozin  propanediol  10 mg Oral Daily   famotidine   20 mg Oral BID   feeding supplement  237 mL Oral BID BM   guaiFENesin   1,200 mg Oral BID   insulin  aspart  0-15 Units Subcutaneous TID WC   insulin  aspart  0-5 Units Subcutaneous QHS   isosorbide  mononitrate  120 mg Oral Daily   metoprolol  succinate  25 mg Oral Daily   multivitamin with minerals  1 tablet Oral Daily   omega-3 acid ethyl esters  2 g Oral BID   pantoprazole   40 mg Oral Daily   predniSONE   40 mg Oral Q breakfast   ranolazine   1,000 mg Oral BID   sacubitril -valsartan   1 tablet Oral BID   spironolactone   12.5 mg Oral  Daily   Infusions:   heparin  1,000 Units/hr (10/09/24 0028)   PRN: acetaminophen , ALPRAZolam , levalbuterol , methocarbamol , nitroGLYCERIN , ondansetron  (ZOFRAN ) IV, mouth rinse, oxyCODONE -acetaminophen  **AND** oxyCODONE , traZODone  Anti-infectives (From admission, onward)    Start     Dose/Rate Route Frequency Ordered Stop   10/05/24 1845  azithromycin  (ZITHROMAX ) tablet 500 mg        500 mg Oral  Once 10/05/24 1840 10/05/24 1908       Assessment: 68 y.o. male with medical history significant for CAD (s/p of stent x 4/CABG 2016), A-fib on Eliquis , HFrEF (EF 35 to 40%, 07/2024), PSVT/frequent PVCs, HTN,DM, COPD on 2L O2, OSA not using CPAP, multiple prior hospitalizations related to chest pain/CHF/COPD, most recently 6 weeks ago (10/5 to 08/22/2024, [facility]), being admitted with chest pain and elevated troponin in the setting of a COPD exacerbation. Pharmacy consulted to start heparin  for afib. Apixaban  will be discontinued. CBC stable. Will use aPTT to monitor heparin  infusion.   Goal of Therapy:  Heparin  level 0.3-0.7 units/ml once heparin  level and aPTT correlate.  aPTT 66-102 seconds Monitor platelets by anticoagulation protocol: Yes   11/22 @ 1301: aPTT 75, therapeutic x 1  11/22 @ 1929: aPTT 67, therapeutic x 2 but lower end of goal 11/23 @ 0447: aPTT 92,  HL 0.72, therapeutic X 3 11/24 @ 0317: aPTT 108, HL 0.65,   elevated    Plan:  11/24 @ 0317:   aPTT = 108,   HL = 0.65 - aPTT slightly elevated, not correlating with HL which is therapeutic - decrease heparin  drip rate to  900 units/hr  - recheck aPTT and HL 6 hrs after rate change; anticipate correlation at that time - Will continue to monitor aPTT until correlation with HL - Continue to monitor H&H and platelets daily   Lawrence [name] D 10/09/2024 4:15 AM

## 2024-10-09 NOTE — Discharge Summary (Signed)
 Physician Discharge Summary   Patient: Lawrence [name] MRN: [id number] DOB: 08/01/1956  Admit date:     10/05/2024  Discharge date: 10/09/24  Discharge Physician: Drue ONEIDA Potter   PCP: [name], MD   Recommendations at discharge:  Follow-up with your cardiologist  Discharge Diagnoses: Principal Problem:   Substernal precordial chest pain Active Problems:   Elevated troponin   CAD S/P percutaneous coronary angioplasty   COPD with acute exacerbation (HCC)   OSA (obstructive sleep apnea)   Chronic respiratory failure with hypoxia (HCC)   Hypocalcemia   Chronic HFrEF (heart failure with reduced ejection fraction) (HCC)   Atrial fibrillation, chronic (HCC)   Hypotension   Type 2 diabetes mellitus without complications (HCC)   PAD (peripheral artery disease)   Depression with anxiety   Hx of CABG   Protein-calorie malnutrition, severe  Resolved Problems:   * No resolved hospital problems. [facility] Course:   From HPI Lawrence [name] is a 68 y.o. male with medical history significant for CAD (s/p of stent x 4/CABG 2016), A-fib on Eliquis , HFrEF (EF 35 to 40%, 07/2024), PSVT/frequent PVCs, HTN,DM, COPD on 2L O2, OSA not using CPAP, multiple prior hospitalizations related to chest pain/CHF/COPD, most recently 6 weeks ago (10/5 to 08/22/2024, [facility]), being admitted with chest pain and elevated troponin in the setting of a COPD exacerbation.  Patient describes a several day history of cough and shortness of breath associated with chest pressure which did not resolve with home breathing treatments.  He has had no fever and chills.  Denies leg pain or swelling.  Due to worsening chest pain he presented to the hospital.  Of note, during his recent admission to [facility], he did have chest pain and was evaluated by cardiology. They believed it chronic and attributed to an upper respiratory tract infection . Troponin was normal. Ischemic workup was not indicated per cardiology.SABRA  His symptoms  improved with oxycodone . In the ED, he was mildly tachycardic to 115 with otherwise normal vitals, saturating at 100% on his home flow rate of oxygen  at 2 L. Labs notable for first troponin of 28 CBC normal.  BMP notable for potassium 3.2, bicarb 19 and calcium  6.4. Respiratory viral panel negative EKG showed A-fib at 107 with no other concerning findings suggestive of ischemia. Chest x-ray clear   Patient was treated with Xopenex  (albuterol  intolerant) as well as prednisone  and azithromycin .  He was given a chewable aspirin .  Also given calcium  repletion in the form of calcium  gluconate.   Admission requested high risk chest pain rule out and COPD exacerbation      Assessment and Plan: * Substernal precordial chest pain, acute coronary syndrome ruled out History of CAD s/p PCI x 4 followed by CABG 2016 History of chronic chest pain Chest pain likely musculoskeletal in origin Recently at [facility] with complaints of chest pain but troponin was normal Telemetry and EKG nonischemic D-dimer negative Troponins negative According to cardiologist acute coronary syndrome ruled out Continue home GDMT-metoprolol , Imdur , Ranexa , atorvastatin , apixaban  Spironolactone  on hold given hyperkalemia Patient will follow-up with outpatient cardiology     COPD with acute exacerbation (HCC) Chronic respiratory failure with hypoxia OSA, CPAP intolerant Scheduled and as needed DuoNebs Continue steroid therapy Continue antitussives, flutter valve Continue supplemental oxygen    Hypocalcemia-improved Calcium  was 6.4 in the ED, etiology uncertain Received calcium  gluconate in the ED Continue to monitor and replete   Hypotension History of hypertension Continue home medication   Atrial fibrillation,  chronic (HCC) Mildly elevated rate likely due to to COPD exacerbation Continue apixaban  and metoprolol    Chronic HFrEF (heart failure with reduced ejection fraction) (HCC) Clinically euvolemic     Type 2 diabetes mellitus without complications (HCC) Sliding scale insulin  coverage   PAD (peripheral artery disease) Continue atorvastatin  and apixaban    Depression with anxiety Continue trazodone  and alprazolam    Consultants: Cardiology Procedures performed: None Disposition: Home Diet recommendation:  Cardiac and Carb modified diet DISCHARGE MEDICATION: Allergies as of 10/09/2024       Reactions   Demerol  [meperidine Hcl]    Demerol [meperidine] Hives   Morphine     Pill form not tolerated. Can have fluid form   Sulfa Antibiotics Hives   Albuterol  Other (See Comments), Palpitations   Pt currently uses this medication.   Empagliflozin  Other (See Comments), Hives   Perineal pain Other Reaction(s): Other (See Comments)   Morphine  Sulfate Nausea And Vomiting, Rash, Other (See Comments)   Pt states that he is only allergic to the tablet form of this medication.          Medication List     STOP taking these medications    lithium  carbonate 150 MG capsule   spironolactone  25 MG tablet Commonly known as: ALDACTONE        TAKE these medications    Accu-Chek Guide test strip Generic drug: glucose blood Use as instructed to check sugar daily for type 2 diabetes.   Accu-Chek Guide w/Device Kit Use to check blood sugars as directed   Accu-Chek Softclix Lancets lancets Use as instructed to check sugar daily for type 2 diabetes.   acetaminophen  325 MG tablet Commonly known as: TYLENOL  Take 2 tablets (650 mg total) by mouth every 6 (six) hours as needed for mild pain (pain score 1-3) or fever (or Fever >/= 101).   albuterol  (2.5 MG/3ML) 0.083% nebulizer solution Commonly known as: PROVENTIL  Take 3 mLs (2.5 mg total) by nebulization every 6 (six) hours as needed for wheezing or shortness of breath.   allopurinol  300 MG tablet Commonly known as: ZYLOPRIM  TAKE 1 TABLET BY MOUTH TWICE A DAY   ALPRAZolam  1 MG tablet Commonly known as: XANAX  TAKE 1 TABLET BY  MOUTH 3 TIMES A DAY AS NEEDED   apixaban  5 MG Tabs tablet Commonly known as: Eliquis  Take 1 tablet (5 mg total) by mouth 2 (two) times daily.   atorvastatin  80 MG tablet Commonly known as: LIPITOR  TAKE 1 TABLET BY MOUTH AT BEDTIME   Blood Pressure Monitor Devi Use to check blood pressure daily   Breztri  Aerosphere 160-9-4.8 MCG/ACT Aero inhaler Generic drug: budesonide -glycopyrrolate -formoterol  INHALE 2 PUFFS BY MOUTH INTO THE LUNGS 2TIMES DAILY   cetirizine  10 MG tablet Commonly known as: ZYRTEC  Take 1 tablet (10 mg total) by mouth at bedtime.   cholecalciferol  25 MCG (1000 UNIT) tablet Commonly known as: VITAMIN D3 Take 1,000 Units by mouth daily.   famotidine  20 MG tablet Commonly known as: PEPCID  TAKE 1 TABLET BY MOUTH TWICE A DAY   Farxiga  10 MG Tabs tablet Generic drug: dapagliflozin  propanediol TAKE 1 TABLET BY MOUTH DAILY   iron  polysaccharides 150 MG capsule Commonly known as: Ferrex 150 Take one capsul every Monday, Wednesday, and Friday   isosorbide  mononitrate 120 MG 24 hr tablet Commonly known as: IMDUR  Take 1 tablet (120 mg total) by mouth daily.   metFORMIN  500 MG 24 hr tablet Commonly known as: GLUCOPHAGE -XR TAKE 1 TABLET BY MOUTH DAILY WITH EVENING MEAL   methocarbamol  500  MG tablet Commonly known as: ROBAXIN  TAKE 1 TABLET BY MOUTH EVERY 8 HOURS AS NEEDED FOR MUSCLE SPASMS   metoprolol  succinate 25 MG 24 hr tablet Commonly known as: TOPROL -XL Take 1.5 tablets (37.5 mg total) by mouth daily. What changed: how much to take   nitroGLYCERIN  0.4 MG SL tablet Commonly known as: NITROSTAT  Place 1 tablet (0.4 mg total) under the tongue every 5 (five) minutes as needed for chest pain (Do not take if blood pressure is low, systolic BP<110).   omega-3 acid ethyl esters 1 g capsule Commonly known as: LOVAZA  TAKE 4 CAPSULES (4 GRAMS TOTAL) BY MOUTHDAILY   ondansetron  4 MG tablet Commonly known as: Zofran  Take 1 tablet (4 mg total) by mouth every 8  (eight) hours as needed for nausea or vomiting.   oxyCODONE -acetaminophen  10-325 MG tablet Commonly known as: PERCOCET TAKE 1 TABLET BY MOUTH EVERY 4 HOURS AS NEEDED FOR PAIN   OXYGEN  Inhale 2 L into the lungs at bedtime as needed (for shortness of breath).   pantoprazole  40 MG tablet Commonly known as: PROTONIX  TAKE 1 TABLET BY MOUTH 2 TIMES DAILY (30MINUTES BEFORE A MEAL)   predniSONE  20 MG tablet Commonly known as: DELTASONE  Take 1 tablet (20 mg total) by mouth 2 (two) times daily with a meal for 5 days.   ranolazine  1000 MG SR tablet Commonly known as: RANEXA  Take 1 tablet (1,000 mg total) by mouth 2 (two) times daily.   sacubitril -valsartan  49-51 MG Commonly known as: Entresto  Take 1 tablet by mouth 2 (two) times daily.   traZODone  150 MG tablet Commonly known as: DESYREL  TAKE 1 TABLET BY MOUTH AT BEDTIME AS NEEDED FOR SLEEP.   Vitamin B-12 1000 MCG Subl Place 1 tablet under the tongue daily.        Contact information for follow-up providers     Hilarie Rocher, MD. Go in 1 week(s).   Specialty: Cardiology Contact information: [address] [place] [address] [phone]              Contact information for after-discharge care     Home Medical Care     [facility]) .   Service: Home Health Services Contact information: [address] [place]  [address] [phone]                    Discharge Exam: Lawrence [name]   10/05/24 1746  Weight: 77.6 kg   Constitutional:      General: He is not in acute distress. HENT:     Head: Normocephalic and atraumatic.  Cardiovascular:     Rate and Rhythm: Normal rate and regular rhythm.     Heart sounds: Normal heart sounds.  Pulmonary:     Effort: Pulmonary effort is normal.     Breath sounds: Normal breath sounds.  Abdominal:     Palpations: Abdomen is soft.     Tenderness: There is no abdominal tenderness.  Neurological:      Mental Status: Mental status is at baseline.   Condition at discharge: good  The results of significant diagnostics from this hospitalization (including imaging, microbiology, ancillary and laboratory) are listed below for reference.   Imaging Studies: DG Chest 2 View Result Date: 10/05/2024 CLINICAL DATA:  Chest pain and weakness. EXAM: CHEST - 2 VIEW COMPARISON:  September 09, 2024 FINDINGS: Multiple sternal wires and vascular clips are seen. The heart size and mediastinal contours are within normal limits. A  coronary artery stent is in place. There is marked severity calcification of the aortic arch. Both lungs are clear. The visualized skeletal structures are unremarkable. IMPRESSION: 1. Evidence of prior median sternotomy/CABG. 2. No active cardiopulmonary disease. Electronically Signed   By: Suzen Dials M.D.   On: 10/05/2024 18:32   CT CHEST LUNG CA SCREEN LOW DOSE W/O CM Result Date: 09/18/2024 CLINICAL DATA:  Former 102 pack-year smoker, quit 2014. EXAM: CT CHEST WITHOUT CONTRAST LOW-DOSE FOR LUNG CANCER SCREENING TECHNIQUE: Multidetector CT imaging of the chest was performed following the standard protocol without IV contrast. RADIATION DOSE REDUCTION: This exam was performed according to the departmental dose-optimization program which includes automated exposure control, adjustment of the mA and/or kV according to patient size and/or use of iterative reconstruction technique. COMPARISON:  06/10/2024. FINDINGS: Cardiovascular: Atherosclerotic calcification of the aorta, aortic valve and coronary arteries. Heart is at the upper limits of normal in size. No pericardial effusion. Mediastinum/Nodes: No pathologically enlarged mediastinal or axillary lymph nodes. Hilar regions are difficult to definitively evaluate without IV contrast. Air in the esophagus can be seen with dysmotility. Lungs/Pleura: Centrilobular and paraseptal emphysema. 3.4 mm right lower lobe nodule. No suspicious  pulmonary nodules. No pleural fluid. Airway is unremarkable. Upper Abdomen: Cholecystectomy. Visualized portions of the liver, adrenal glands, kidneys, spleen, pancreas, stomach and bowel are grossly unremarkable. No upper abdominal adenopathy. Musculoskeletal: Degenerative changes in the spine.  Osteopenia. IMPRESSION: 1. Lung-RADS 2, benign appearance or behavior. Continue annual screening with low-dose chest CT without contrast in 12 months. 2. Aortic atherosclerosis (ICD10-I70.0). Coronary artery calcification. 3.  Emphysema (ICD10-J43.9). Electronically Signed   By: Newell Eke M.D.   On: 09/18/2024 09:18   CT ABDOMEN PELVIS W CONTRAST Result Date: 09/09/2024 EXAM: CT ABDOMEN AND PELVIS WITH CONTRAST 09/09/2024 09:54:35 PM TECHNIQUE: CT of the abdomen and pelvis was performed with the administration of 100 mL of iohexol  (OMNIPAQUE ) 300 MG/ML solution. Multiplanar reformatted images are provided for review. Automated exposure control, iterative reconstruction, and/or weight-based adjustment of the mA/kV was utilized to reduce the radiation dose to as low as reasonably achievable. COMPARISON: 08/13/2024 CLINICAL HISTORY: RLQ pain, nausea, diarrhea. Table formatting from the original note was not included.; Images from the original note were not included.; Per ed notes; Patient reports getting light headed last night and fell, and then fell again today. Reports pain in left arm and lower back. ; Patient reports taking Eliquis  at time. Denies hitting head \\T \ denies LOC. ; Patient reports left sided chest pain since falling today. FINDINGS: LOWER CHEST: No acute abnormality. LIVER: The liver is unremarkable. GALLBLADDER AND BILE DUCTS: Post cholecystectomy. No biliary ductal dilatation. SPLEEN: No acute abnormality. PANCREAS: No acute abnormality. ADRENAL GLANDS: Stable 14 mm right adrenal nodule, compatible with an adrenal adenoma. KIDNEYS, URETERS AND BLADDER: 11 mm simple right renal cyst, benign  (Bosniak 1). No follow-up is recommended. No stones in the kidneys or ureters. No hydronephrosis. No perinephric or periureteral stranding. Urinary bladder is unremarkable. GI AND BOWEL: Small hiatal hernia. Stomach demonstrates no acute abnormality. There is no bowel obstruction. Normal appendix (image 60). PERITONEUM AND RETROPERITONEUM: No ascites. No free air. VASCULATURE: Aorta is normal in caliber. Diffuse aortoiliac atherosclerosis. LYMPH NODES: No lymphadenopathy. REPRODUCTIVE ORGANS: Prostate is unremarkable. BONES AND SOFT TISSUES: Multilevel degenerative disc disease. Mild degenerative changes of the lower thoracic spine. Small fat-containing left inguinal hernia. No acute osseous abnormality. No focal soft tissue abnormality. IMPRESSION: 1. No acute findings in the abdomen or pelvis. 2. Normal appendix.  Electronically signed by: Pinkie Pebbles MD 09/09/2024 10:10 PM EDT RP Workstation: [id number]   DG Wrist Complete Left Result Date: 09/09/2024 EXAM: 3 or more VIEW(S) XRAY OF THE LEFT WRIST 09/09/2024 09:24:00 PM COMPARISON: None available. CLINICAL HISTORY: Left wrist pain after multiple falls today. FINDINGS: BONES AND JOINTS: No acute fracture. No focal osseous lesion. No joint dislocation. SOFT TISSUES: The soft tissues are unremarkable. IMPRESSION: 1. No significant abnormality. Electronically signed by: Dorethia Molt MD 09/09/2024 09:26 PM EDT RP Workstation: [id number]   CT Cervical Spine Wo Contrast Result Date: 09/09/2024 EXAM: CT CERVICAL SPINE WITHOUT CONTRAST 09/09/2024 07:53:17 PM TECHNIQUE: CT of the cervical spine was performed without the administration of intravenous contrast. Multiplanar reformatted images are provided for review. Automated exposure control, iterative reconstruction, and/or weight based adjustment of the mA/kV was utilized to reduce the radiation dose to as low as reasonably achievable. COMPARISON: None available. CLINICAL HISTORY: Neck trauma (Age >= 65y).  Table formatting from the original note was not included. Images from the original note were not included. Per ed notes; Patient reports getting light headed last night and fell, and then fell again today. Reports pain in left arm and lower back. Patient reports taking Eliquis  at time. Denies hitting head \\T \ denies LOC. Patient reports left sided chest pain since falling today. FINDINGS: BONES AND ALIGNMENT: No acute fracture or traumatic malalignment. DEGENERATIVE CHANGES: No significant degenerative changes. SOFT TISSUES: No prevertebral soft tissue swelling. IMPRESSION: 1. No acute abnormality of the cervical spine. Electronically signed by: Pinkie Pebbles MD 09/09/2024 08:17 PM EDT RP Workstation: [id number]   DG Chest 2 View Result Date: 09/09/2024 EXAM: 2 VIEW(S) XRAY OF THE CHEST 09/09/2024 08:07:00 PM COMPARISON: 08/16/2024 CLINICAL HISTORY: Chest pain, fall. PER ER NOTE; Patient reports getting light headed last night and fell, and then fell again today. Reports pain in left arm and lower back. ; Patient reports taking Eliquis  at time. Denies hitting head \\T \ denies LOC. ; Patient reports left sided chest pain since falling today. FINDINGS: LUNGS AND PLEURA: No focal pulmonary opacity. No pulmonary edema. No pleural effusion. No pneumothorax. HEART AND MEDIASTINUM: Thoracic aortic atherosclerosis. BONES AND SOFT TISSUES: Median sternotomy. Degenerative changes of the thoracic spine. IMPRESSION: 1. No acute cardiopulmonary process. Electronically signed by: Pinkie Pebbles MD 09/09/2024 08:17 PM EDT RP Workstation: [id number]   CT Head Wo Contrast Result Date: 09/09/2024 EXAM: CT HEAD WITHOUT CONTRAST 09/09/2024 07:53:17 PM TECHNIQUE: CT of the head was performed without the administration of intravenous contrast. Automated exposure control, iterative reconstruction, and/or weight based adjustment of the mA/kV was utilized to reduce the radiation dose to as low as reasonably achievable.  COMPARISON: 07/08/2024 CLINICAL HISTORY: Head trauma, minor (Age >= 65y). Table formatting from the original note was not included.; Images from the original note were not included.; Per ed notes; Patient reports getting light headed last night and fell, and then fell again today. Reports pain in left arm and lower back. ; Patient reports taking Eliquis  at time. Denies hitting head \\T \ denies LOC. ; Patient reports left sided chest pain since falling today. FINDINGS: BRAIN AND VENTRICLES: No acute hemorrhage. No evidence of acute infarct. No hydrocephalus. No extra-axial collection. No mass effect or midline shift. ORBITS: No acute abnormality. SINUSES: No acute abnormality. SOFT TISSUES AND SKULL: No acute soft tissue abnormality. No skull fracture. IMPRESSION: 1. No acute intracranial abnormality. Electronically signed by: Pinkie Pebbles MD 09/09/2024 08:10 PM EDT RP Workstation: [id number]    Microbiology: Results for orders placed or performed  during the hospital encounter of 10/05/24  Resp panel by RT-PCR (RSV, Flu A&B, Covid) Anterior Nasal Swab     Status: None   Collection Time: 10/05/24  7:13 PM   Specimen: Anterior Nasal Swab  Result Value Ref Range Status   SARS Coronavirus 2 by RT PCR NEGATIVE NEGATIVE Final    Comment: (NOTE) SARS-CoV-2 target nucleic acids are NOT DETECTED.  The SARS-CoV-2 RNA is generally detectable in upper respiratory specimens during the acute phase of infection. The lowest concentration of SARS-CoV-2 viral copies this assay can detect is 138 copies/mL. A negative result does not preclude SARS-Cov-2 infection and should not be used as the sole basis for treatment or other patient management decisions. A negative result may occur with  improper specimen collection/handling, submission of specimen other than nasopharyngeal swab, presence of viral mutation(s) within the areas targeted by this assay, and inadequate number of viral copies(<138 copies/mL). A  negative result must be combined with clinical observations, patient history, and epidemiological information. The expected result is Negative.  Fact Sheet for Patients:  bloggercourse.com  Fact Sheet for Healthcare Providers:  seriousbroker.it  This test is no t yet approved or cleared by the [place]  FDA and  has been authorized for detection and/or diagnosis of SARS-CoV-2 by FDA under an Emergency Use Authorization (EUA). This EUA will remain  in effect (meaning this test can be used) for the duration of the COVID-19 declaration under Section 564(b)(1) of the Act, 21 U.S.C.section 360bbb-3(b)(1), unless the authorization is terminated  or revoked sooner.       Influenza A by PCR NEGATIVE NEGATIVE Final   Influenza B by PCR NEGATIVE NEGATIVE Final    Comment: (NOTE) The Xpert Xpress SARS-CoV-2/FLU/RSV plus assay is intended as an aid in the diagnosis of influenza from Nasopharyngeal swab specimens and should not be used as a sole basis for treatment. Nasal washings and aspirates are unacceptable for Xpert Xpress SARS-CoV-2/FLU/RSV testing.  Fact Sheet for Patients: bloggercourse.com  Fact Sheet for Healthcare Providers: seriousbroker.it  This test is not yet approved or cleared by the [place]  FDA and has been authorized for detection and/or diagnosis of SARS-CoV-2 by FDA under an Emergency Use Authorization (EUA). This EUA will remain in effect (meaning this test can be used) for the duration of the COVID-19 declaration under Section 564(b)(1) of the Act, 21 U.S.C. section 360bbb-3(b)(1), unless the authorization is terminated or revoked.     Resp Syncytial Virus by PCR NEGATIVE NEGATIVE Final    Comment: (NOTE) Fact Sheet for Patients: bloggercourse.com  Fact Sheet for Healthcare  Providers: seriousbroker.it  This test is not yet approved or cleared by the [place]  FDA and has been authorized for detection and/or diagnosis of SARS-CoV-2 by FDA under an Emergency Use Authorization (EUA). This EUA will remain in effect (meaning this test can be used) for the duration of the COVID-19 declaration under Section 564(b)(1) of the Act, 21 U.S.C. section 360bbb-3(b)(1), unless the authorization is terminated or revoked.  Performed at [facility], [address] [place], [place] [address]    *Note: Due to a large number of results and/or encounters for the requested time period, some results have not been displayed. A complete set of results can be found in Results Review.    Labs: CBC: Recent Labs  Lab 10/05/24 1750 10/07/24 0556 10/08/24 0447 10/09/24 0317  WBC 7.4 12.1* 8.2 6.4  NEUTROABS  --  9.9* 6.6 4.7  HGB 16.4 15.3 16.1 17.2*  HCT 48.9  45.8 49.9 52.2*  MCV 89.7 91.2 92.4 91.3  PLT 189 166 162 160   Basic Metabolic Panel: Recent Labs  Lab 10/05/24 1750 10/06/24 0411 10/07/24 0556 10/08/24 0447 10/09/24 0317  NA 143 134* 139 138 138  K 3.2* 4.5 5.5* 5.4* 5.3*  CL 110 94* 106 101 99  CO2 19* 22 29 32 33*  GLUCOSE 77 376* 142* 131* 158*  BUN 19 33* 24* 21 24*  CREATININE 0.80 1.72* 1.02 0.88 1.07  CALCIUM  6.4* 9.5 9.2 9.6 9.7  MG  --  2.3  --   --   --    Liver Function Tests: Recent Labs  Lab 10/06/24 0411  AST 17  ALT 10  ALKPHOS 71  BILITOT 0.7  PROT 6.5  ALBUMIN 4.1   CBG: Recent Labs  Lab 10/08/24 1233 10/08/24 1735 10/08/24 2123 10/09/24 0833 10/09/24 1211  GLUCAP 190* 298* 125* 133* 169*    Discharge time spent:  35 minutes.  Signed: Drue ONEIDA Potter, MD [facility] 10/09/2024

## 2024-10-09 NOTE — Progress Notes (Signed)
 Physical Therapy Treatment Patient Details Name: Lawrence [name] MRN: [id number] DOB: [birth year] Today's Date: 10/09/2024   History of Present Illness 68 y/o male presented to ED on 10/05/24 for SOB x 2 days along with cough and congestion. Admitted for substernal precordial chest pain and elevated troponin. PMH: CAD (s/p of stent x 4 and CBAG 2016), sCHF with EF 30-35%, a fib on Eliquis , PSVT,  HTN, HLD, DM, COPD on 2L O2, OSA not using CPAP, dCHF, anxiety, celiac disease, pancreatitis.    PT Comments  Patient seen for PT session focused on functional mobility and balance. Patient required min/CGA for ambulation and used 1+ hand held. Tolerated session well with no signs of exertion or distress. Vitals remained stable during activity. Main limiting factors today were . Interventions aimed at improving. Patient shows good potential to make progress with continued acute level rehab. Continued skilled PT recommended to progress toward functional goals and support discharge readiness. Pt making good progress toward goals, will continue to follow POC. Discharge recommendation remains appropriate     If plan is discharge home, recommend the following: A little help with walking and/or transfers;A little help with bathing/dressing/bathroom;Assistance with cooking/housework;Assist for transportation;Help with stairs or ramp for entrance   Can travel by private vehicle        Equipment Recommendations  Rolling walker (2 wheels)    Recommendations for Other Services       Precautions / Restrictions Precautions Precautions: Fall Recall of Precautions/Restrictions: Intact Restrictions Weight Bearing Restrictions Per Provider Order: No     Mobility  Bed Mobility Overal bed mobility: Modified Independent             General bed mobility comments: HOB raised, has hospital bed at home    Transfers Overall transfer level: Needs assistance Equipment used: Rolling walker (2  wheels) Transfers: Sit to/from Stand Sit to Stand: Contact guard assist                Ambulation/Gait Ambulation/Gait assistance: Contact guard assist, Min assist Gait Distance (Feet): 220 Feet Assistive device: 1 person hand held assist Gait Pattern/deviations: Step-through pattern, Decreased stride length Gait velocity: decreased     General Gait Details: CGA for safety. Patient reporting feeling fatigue after ambulation. recovered well. O2 sat normal. unsteady without AD recommended use of rollator   Stairs             Wheelchair Mobility     Tilt Bed    Modified Rankin (Stroke Patients Only)       Balance Overall balance assessment: Needs assistance Sitting-balance support: No upper extremity supported, Feet unsupported Sitting balance-Leahy Scale: Normal     Standing balance support: Reliant on assistive device for balance Standing balance-Leahy Scale: Fair                              Hotel Manager: No apparent difficulties  Cognition Arousal: Alert Behavior During Therapy: WFL for tasks assessed/performed   PT - Cognitive impairments: No apparent impairments                         Following commands: Intact      Cueing Cueing Techniques: Verbal cues  Exercises      General Comments        Pertinent Vitals/Pain      Home Living Family/patient expects to be discharged to:: Private residence Living Arrangements: Other relatives Available Help  at Discharge: Family;Available 24 hours/day Type of Home: Apartment Home Access: Level entry;Ramped entrance       Home Layout: One level Home Equipment: Shower seat - built in;Rollator (4 wheels);BSC/3in1;Hospital bed;Grab bars - toilet;Grab bars - tub/shower      Prior Function            PT Goals (current goals can now be found in the care plan section) Acute Rehab PT Goals Patient Stated Goal: to get stronger PT Goal  Formulation: With patient Time For Goal Achievement: 10/20/24 Potential to Achieve Goals: Good    Frequency    Min 2X/week      PT Plan      Co-evaluation              AM-PAC PT 6 Clicks Mobility   Outcome Measure  Help needed turning from your back to your side while in a flat bed without using bedrails?: A Little Help needed moving from lying on your back to sitting on the side of a flat bed without using bedrails?: A Little Help needed moving to and from a bed to a chair (including a wheelchair)?: A Little Help needed standing up from a chair using your arms (e.g., wheelchair or bedside chair)?: A Little Help needed to walk in hospital room?: A Little Help needed climbing 3-5 steps with a railing? : A Little 6 Click Score: 18    End of Session Equipment Utilized During Treatment: Gait belt Activity Tolerance: Patient tolerated treatment well;Patient limited by fatigue Patient left: in chair;with call bell/phone within reach;with nursing/sitter in room Nurse Communication: Mobility status PT Visit Diagnosis: Muscle weakness (generalized) (M62.81);Unsteadiness on feet (R26.81)     Time: 8861-8849 PT Time Calculation (min) (ACUTE ONLY): 12 min  Charges:    $Therapeutic Activity: 8-22 mins PT General Charges $$ ACUTE PT VISIT: 1 Visit                     Lawrence Lesches DPT, PT     Lawrence [name] 10/09/2024, 12:07 PM

## 2024-10-09 NOTE — Plan of Care (Signed)
  Problem: Education: Goal: Ability to describe self-care measures that may prevent or decrease complications (Diabetes Survival Skills Education) will improve Outcome: Progressing Goal: Individualized Educational Video(s) Outcome: Progressing   Problem: Coping: Goal: Ability to adjust to condition or change in health will improve Outcome: Progressing   Problem: Fluid Volume: Goal: Ability to maintain a balanced intake and output will improve Outcome: Progressing   Problem: Metabolic: Goal: Ability to maintain appropriate glucose levels will improve Outcome: Progressing   Problem: Health Behavior/Discharge Planning: Goal: Ability to identify and utilize available resources and services will improve Outcome: Progressing Goal: Ability to manage health-related needs will improve Outcome: Progressing   Problem: Nutritional: Goal: Maintenance of adequate nutrition will improve Outcome: Progressing Goal: Progress toward achieving an optimal weight will improve Outcome: Progressing   Problem: Tissue Perfusion: Goal: Adequacy of tissue perfusion will improve Outcome: Progressing   Problem: Education: Goal: Understanding of cardiac disease, CV risk reduction, and recovery process will improve Outcome: Progressing Goal: Individualized Educational Video(s) Outcome: Progressing   Problem: Activity: Goal: Ability to tolerate increased activity will improve Outcome: Progressing   Problem: Cardiac: Goal: Ability to achieve and maintain adequate cardiovascular perfusion will improve Outcome: Progressing   Problem: Health Behavior/Discharge Planning: Goal: Ability to safely manage health-related needs after discharge will improve Outcome: Progressing   Problem: Education: Goal: Knowledge of General Education information will improve Description: Including pain rating scale, medication(s)/side effects and non-pharmacologic comfort measures Outcome: Progressing   Problem: Health  Behavior/Discharge Planning: Goal: Ability to manage health-related needs will improve Outcome: Progressing   Problem: Clinical Measurements: Goal: Ability to maintain clinical measurements within normal limits will improve Outcome: Progressing Goal: Will remain free from infection Outcome: Progressing Goal: Diagnostic test results will improve Outcome: Progressing Goal: Respiratory complications will improve Outcome: Progressing Goal: Cardiovascular complication will be avoided Outcome: Progressing   Problem: Activity: Goal: Risk for activity intolerance will decrease Outcome: Progressing   Problem: Nutrition: Goal: Adequate nutrition will be maintained Outcome: Progressing   Problem: Coping: Goal: Level of anxiety will decrease Outcome: Progressing   Problem: Elimination: Goal: Will not experience complications related to bowel motility Outcome: Progressing Goal: Will not experience complications related to urinary retention Outcome: Progressing   Problem: Skin Integrity: Goal: Risk for impaired skin integrity will decrease Outcome: Progressing   Problem: Safety: Goal: Ability to remain free from injury will improve Outcome: Progressing   Problem: Pain Managment: Goal: General experience of comfort will improve and/or be controlled Outcome: Progressing   Problem: Education: Goal: Knowledge of disease or condition will improve Outcome: Progressing Goal: Knowledge of the prescribed therapeutic regimen will improve Outcome: Progressing Goal: Individualized Educational Video(s) Outcome: Progressing   Problem: Respiratory: Goal: Ability to maintain a clear airway will improve Outcome: Progressing Goal: Levels of oxygenation will improve Outcome: Progressing Goal: Ability to maintain adequate ventilation will improve Outcome: Progressing   Problem: Activity: Goal: Ability to tolerate increased activity will improve Outcome: Progressing Goal: Will verbalize  the importance of balancing activity with adequate rest periods Outcome: Progressing

## 2024-10-10 ENCOUNTER — Telehealth: Payer: Self-pay | Admitting: *Deleted

## 2024-10-10 NOTE — Transitions of Care (Post Inpatient/ED Visit) (Signed)
 10/10/2024  Name: Lawrence [name] MRN: [id number] DOB: 04-Nov-1956  Today's TOC FU Call Status: Today's TOC FU Call Status:: Successful TOC FU Call Completed TOC FU Call Complete Date: 10/10/24  Patient's Name and Date of Birth confirmed. Name, DOB  Transition Care Management Follow-up Telephone Call Date of Discharge: 10/09/24 Discharge Facility: [facility]) Type of Discharge: Inpatient Admission Primary Inpatient Discharge Diagnosis:: Substernal precordial chest pain How have you been since you were released from the hospital?: Better (Doing better but still feeling not feeling all the way) Any questions or concerns?: No  Items Reviewed: Did you receive and understand the discharge instructions provided?: Yes Medications obtained,verified, and reconciled?: Yes (Medications Reviewed) Any new allergies since your discharge?: No Dietary orders reviewed?: No Do you have support at home?: Yes People in Home [RPT]: alone Name of Support/Comfort Primary Source: [name] grandson  Medications Reviewed Today: Medications Reviewed Today     Reviewed by Kennieth Cathlean DEL, RN (Case Manager) on 10/10/24 at 1001  Med List Status: <None>   Medication Order Taking? Sig Documenting Provider Last Dose Status Informant  Accu-Chek Softclix Lancets lancets [id number] Yes Use as instructed to check sugar daily for type 2 diabetes. [name], MD  Active Self  acetaminophen  (TYLENOL ) 325 MG tablet [id number] Yes Take 2 tablets (650 mg total) by mouth every 6 (six) hours as needed for mild pain (pain score 1-3) or fever (or Fever >/= 101). Dorinda Drue DASEN, MD  Active Self  albuterol  (PROVENTIL ) (2.5 MG/3ML) 0.083% nebulizer solution [id number] Yes Take 3 mLs (2.5 mg total) by nebulization every 6 (six) hours as needed for wheezing or shortness of breath. [name], MD  Active Self  allopurinol  (ZYLOPRIM ) 300 MG tablet [id number] Yes TAKE 1 TABLET BY MOUTH TWICE A DAY  [name], MD  Active Self  ALPRAZolam  (XANAX ) 1 MG tablet [id number] Yes TAKE 1 TABLET BY MOUTH 3 TIMES A DAY AS NEEDED [name], MD  Active Self  apixaban  (ELIQUIS ) 5 MG TABS tablet [id number] Yes Take 1 tablet (5 mg total) by mouth 2 (two) times daily. [name], MD  Active Self  atorvastatin  (LIPITOR ) 80 MG tablet [id number] Yes TAKE 1 TABLET BY MOUTH AT BEDTIME [name], MD  Active Self  Blood Glucose Monitoring Suppl (ACCU-CHEK GUIDE) w/Device KIT [id number] Yes Use to check blood sugars as directed [name], MD  Active Self  Blood Pressure Monitor DEVI [id number] Yes Use to check blood pressure daily [name], MD  Active Self  BREZTRI  AEROSPHERE 160-9-4.8 MCG/ACT AERO inhaler [id number] Yes INHALE 2 PUFFS BY MOUTH INTO THE LUNGS 2TIMES DAILY [name], MD  Active Self  cetirizine  (ZYRTEC ) 10 MG tablet [id number] Yes Take 1 tablet (10 mg total) by mouth at bedtime. [name], MD  Active Self  cholecalciferol  (VITAMIN D3) 25 MCG (1000 UNIT) tablet [id number] Yes Take 1,000 Units by mouth daily. [provider]  Active Self  Cyanocobalamin  (VITAMIN B-12) 1000 MCG SUBL [id number] Yes Place 1 tablet under the tongue daily. [provider]  Active Self  dapagliflozin  propanediol (FARXIGA ) 10 MG TABS tablet [id number] Yes TAKE 1 TABLET BY MOUTH DAILY [name], MD  Active Self  famotidine  (PEPCID ) 20 MG tablet [id number] Yes TAKE 1 TABLET BY MOUTH TWICE A DAY [name], MD  Active Self  glucose blood (ACCU-CHEK GUIDE) test strip [id number] Yes Use as instructed to check sugar daily for type 2  diabetes. [name], MD  Active Self  iron  polysaccharides [facility] 150) 150 MG capsule [id number] Yes Take one capsul every Monday, Wednesday, and Friday [name], MD  Active Self  isosorbide  mononitrate (IMDUR ) 120 MG 24 hr tablet [id number] Yes Take 1 tablet (120 mg total) by mouth daily. Von Bellis, MD  Active Self            Med Note BEVERLEE, [place]   Dju Jun 10, 2024 12:01 AM) Pt taking 60 mg  metFORMIN  (GLUCOPHAGE -XR) 500 MG 24 hr tablet [id number] Yes TAKE 1 TABLET BY MOUTH DAILY WITH EVENING MEAL [name], MD  Active Self  methocarbamol  (ROBAXIN ) 500 MG tablet [id number] Yes TAKE 1 TABLET BY MOUTH EVERY 8 HOURS AS NEEDED FOR MUSCLE SPASMS [name], MD  Active Self  metoprolol  succinate (TOPROL -XL) 25 MG 24 hr tablet [id number] Yes Take 1.5 tablets (37.5 mg total) by mouth daily. Dorinda Drue DASEN, MD  Active   nitroGLYCERIN  (NITROSTAT ) 0.4 MG SL tablet [id number] Yes Place 1 tablet (0.4 mg total) under the tongue every 5 (five) minutes as needed for chest pain (Do not take if blood pressure is low, systolic BP<110). [name], MD  Active Self           Med Note HALLIE, [facility]   Tue Mar 21, 2024 12:58 PM) Needs new order  omega-3 acid ethyl esters (LOVAZA ) 1 g capsule [id number] Yes TAKE 4 CAPSULES (4 GRAMS TOTAL) BY WARDEN [name], MD  Active Self  ondansetron  (ZOFRAN ) 4 MG tablet [id number] Yes Take 1 tablet (4 mg total) by mouth every 8 (eight) hours as needed for nausea or vomiting. [name], MD  Active Self  oxyCODONE -acetaminophen  (PERCOCET) 10-325 MG tablet [id number] Yes TAKE 1 TABLET BY MOUTH EVERY 4 HOURS AS NEEDED FOR PAIN [name], MD  Active Self  OXYGEN  [id number] Yes Inhale 2 L into the lungs at bedtime as needed (for shortness of breath). [provider]  Active Self  pantoprazole  (PROTONIX ) 40 MG tablet [id number] Yes TAKE 1 TABLET BY MOUTH 2 TIMES DAILY (30MINUTES BEFORE A MEAL) [name], MD  Active Self  ranolazine  (RANEXA ) 1000 MG SR tablet [id number] Yes Take 1 tablet (1,000 mg total) by mouth 2 (two) times daily. Dorinda Drue DASEN, MD  Active Self  sacubitril -valsartan  (ENTRESTO ) 49-51 MG [id number] Yes Take 1 tablet by mouth 2 (two) times daily. [name], MD  Active Self  traZODone  (DESYREL ) 150 MG tablet [id number] Yes TAKE 1  TABLET BY MOUTH AT BEDTIME AS NEEDED FOR SLEEP. [name], MD  Active Self  Med List Note [name], Richerd GRADE, RN 06/29/24 1843): 06/16/24 Reviewed with grandson, [name] with patient's permission today 06/29/24 Medications reviewed with grandson, [name] who assist in managing patient's medications, Patient's medications list from 06/23/24 AVS reviewed and patient to restart Imdur  and Metoprolol . Patient didn't have Metoprolol . He is to check cabinet where his paused medications were.            Home Care and Equipment/Supplies: Were Home Health Services Ordered?: Yes Name of Home Health Agency:: [facility] Has Agency set up a time to come to your home?: No EMR reviewed for Home Health Orders: Orders present/patient has not received call (refer to CM for follow-up) Any new equipment or medical supplies ordered?: No  Functional Questionnaire: Do you need assistance with bathing/showering or dressing?: No Do you need assistance with meal preparation?: Yes Do you need assistance with eating?:  No Do you have difficulty maintaining continence: No Do you need assistance with getting out of bed/getting out of a chair/moving?: No Do you have difficulty managing or taking your medications?: Yes (Grandson handles all medications)  Follow up appointments reviewed: PCP Follow-up appointment confirmed?: No MD Provider Line [phone] Given: Yes (Per patient he wanted to call and set up his appointment) Specialist Hospital Follow-up appointment confirmed?: Yes Date of Specialist follow-up appointment?: 10/30/24 Follow-Up Specialty Provider:: Aksay Pendyal Do you need transportation to your follow-up appointment?: No Do you understand care options if your condition(s) worsen?: Yes-patient verbalized understanding  SDOH Interventions Today    Flowsheet Row Most Recent Value  SDOH Interventions   Food Insecurity Interventions Inpatient TOC, Intervention Not Indicated  [patient was given  resourses by inpatient TOC]  Housing Interventions Intervention Not Indicated  Transportation Interventions Intervention Not Indicated  Utilities Interventions Intervention Not Indicated   Discussed and offered 30 day TOC program.  Patient   declined.  The patient has been provided with contact information for the care management team and has been advised to call with any health -related questions or concerns.  The patient verbalized understanding with current plan of care.  The patient is directed to their insurance card regarding availability of benefits coverage  Cathlean Headland BSN RN [facility] Health Care Management Coordinator [email or web address] Direct Dial: [phone]  Fax: [phone] Website: [email or web address]

## 2024-10-11 ENCOUNTER — Ambulatory Visit: Payer: Self-pay

## 2024-10-11 NOTE — Telephone Encounter (Signed)
 FYI Only or Action Required?: FYI only for provider: ED advised.  Patient was last seen in primary care on 08/11/2024 by [name], MD.  Called Nurse Triage reporting Numbness.  Symptoms began yesterday.  Interventions attempted: Nothing.  Symptoms are: unchanged.  Triage Disposition: See HCP Within 4 Hours (Or PCP Triage)  Patient/caregiver understands and will follow disposition?: Yes      Copied from CRM #[id number]. Topic: Clinical - Red Word Triage >> Oct 11, 2024  4:08 PM [name] wrote: Red Word that prompted transfer to Nurse Triage: Was put on prednisone  in hospital and now his whole neck is numb and tingling, unsure if allergic reaction.      Reason for Disposition  [1] Tingling (e.g., pins and needles) of the face, arm / hand, or leg / foot on one side of the body AND [2] present now  (Exceptions: Chronic/recurrent symptom lasting > 4 weeks; or from known cause, such as: bumped elbow, carpal tunnel, pinched nerve.)  Answer Assessment - Initial Assessment Questions Due to new onset of numbness in neck, possible reaction to prednisone , and continued shortness of breath, this RN advised the patient to go to the ED for evaluation, which he is agreeable with.      1. SYMPTOM: What is the main symptom you are concerned about? (e.g., weakness, numbness)     Numbness to the front of his neck  2. ONSET: When did this start? (e.g., minutes, hours, days; while sleeping)     Yesterday after starting prednisone   3. LAST NORMAL: When was the last time you (the patient) were normal (no symptoms)?     Yesterday  4. PATTERN Does this come and go, or has it been constant since it started?  Is it present now?     Constant  5. CARDIAC SYMPTOMS: Have you had any of the following symptoms: chest pain, difficulty breathing, palpitations?     No 6. NEUROLOGIC SYMPTOMS: Have you had any of the following symptoms: headache, dizziness, vision loss, double vision,  changes in speech, unsteady on your feet?     No 7. OTHER SYMPTOMS: Do you have any other symptoms?     Shortness of breath that he was seen in the ED for  Protocols used: Neurologic [facility]

## 2024-10-13 ENCOUNTER — Telehealth: Payer: Self-pay

## 2024-10-16 ENCOUNTER — Emergency Department

## 2024-10-16 ENCOUNTER — Observation Stay
Admission: EM | Admit: 2024-10-16 | Discharge: 2024-10-21 | DRG: 191 | Disposition: A | Attending: Internal Medicine | Admitting: Internal Medicine

## 2024-10-16 DIAGNOSIS — E119 Type 2 diabetes mellitus without complications: Secondary | ICD-10-CM

## 2024-10-16 DIAGNOSIS — K219 Gastro-esophageal reflux disease without esophagitis: Secondary | ICD-10-CM | POA: Diagnosis present

## 2024-10-16 DIAGNOSIS — I2510 Atherosclerotic heart disease of native coronary artery without angina pectoris: Secondary | ICD-10-CM | POA: Insufficient documentation

## 2024-10-16 DIAGNOSIS — F419 Anxiety disorder, unspecified: Secondary | ICD-10-CM | POA: Insufficient documentation

## 2024-10-16 DIAGNOSIS — J441 Chronic obstructive pulmonary disease with (acute) exacerbation: Principal | ICD-10-CM | POA: Diagnosis present

## 2024-10-16 DIAGNOSIS — I509 Heart failure, unspecified: Secondary | ICD-10-CM | POA: Diagnosis not present

## 2024-10-16 DIAGNOSIS — R072 Precordial pain: Secondary | ICD-10-CM | POA: Diagnosis not present

## 2024-10-16 DIAGNOSIS — I482 Chronic atrial fibrillation, unspecified: Secondary | ICD-10-CM | POA: Insufficient documentation

## 2024-10-16 DIAGNOSIS — R7989 Other specified abnormal findings of blood chemistry: Secondary | ICD-10-CM | POA: Diagnosis not present

## 2024-10-16 LAB — BASIC METABOLIC PANEL WITH GFR
Anion gap: 14 (ref 5–15)
BUN: 17 mg/dL (ref 8–23)
CO2: 25 mmol/L (ref 22–32)
Calcium: 9.3 mg/dL (ref 8.9–10.3)
Chloride: 102 mmol/L (ref 98–111)
Creatinine, Ser: 0.95 mg/dL (ref 0.61–1.24)
GFR, Estimated: >60 mL/min (ref >60)
Glucose, Bld: 188 mg/dL — ABNORMAL HIGH (ref 70–99)
Potassium: 3.4 mmol/L — ABNORMAL LOW (ref 3.5–5.1)
Sodium: 140 mmol/L (ref 135–145)

## 2024-10-16 LAB — CBC
HCT: 56.2 % — ABNORMAL HIGH (ref 39.0–52.0)
Hemoglobin: 18.3 g/dL — ABNORMAL HIGH (ref 13.0–17.0)
MCH: 29.9 pg (ref 26.0–34.0)
MCHC: 32.6 g/dL (ref 30.0–36.0)
MCV: 91.7 fL (ref 80.0–100.0)
Platelets: 179 K/uL (ref 150–400)
RBC: 6.13 MIL/uL — ABNORMAL HIGH (ref 4.22–5.81)
RDW: 15.3 % (ref 11.5–15.5)
WBC: 10.8 K/uL — ABNORMAL HIGH (ref 4.0–10.5)
nRBC: 0 % (ref 0.0–0.2)

## 2024-10-16 LAB — TROPONIN T, HIGH SENSITIVITY
Troponin T High Sensitivity: 20 ng/L — ABNORMAL HIGH (ref 0–19)
Troponin T High Sensitivity: 21 ng/L — ABNORMAL HIGH (ref 0–19)

## 2024-10-16 MED ORDER — METHYLPREDNISOLONE SODIUM SUCC 125 MG IJ SOLR
125.0000 mg | Freq: Once | INTRAMUSCULAR | Status: AC
  Administered 2024-10-16: 125 mg via INTRAVENOUS
  Filled 2024-10-16: qty 2

## 2024-10-16 MED ORDER — IPRATROPIUM-ALBUTEROL 0.5-2.5 (3) MG/3ML IN SOLN
3.0000 mL | Freq: Once | RESPIRATORY_TRACT | Status: AC
  Administered 2024-10-16: 3 mL via RESPIRATORY_TRACT
  Filled 2024-10-16: qty 3

## 2024-10-16 MED ORDER — SODIUM CHLORIDE 0.9 % IV BOLUS
500.0000 mL | Freq: Once | INTRAVENOUS | Status: AC
  Administered 2024-10-16: 500 mL via INTRAVENOUS

## 2024-10-16 NOTE — ED Triage Notes (Signed)
 Pt presents to the ED via POV from home with mid sternal chest pain x1 hr. Pt has a significant cardiac hx. States that he is always in afib. Pt reports mild nausea.

## 2024-10-16 NOTE — ED Provider Notes (Signed)
 [facility] Provider Note    Event Date/Time   First MD Initiated Contact with Patient 10/16/24 2140     (approximate)   History   Chest Pain  Pt presents to the ED via POV from home with mid sternal chest pain x1 hr. Pt has a significant cardiac hx. States that he is always in afib. Pt reports mild nausea.   HPI Lawrence [name] is a 68 y.o. male PMH CAD with prior CABG, COPD on 2 L nasal cannula, CHF (EF 35-40%), A-fib on Eliquis , T2DM presents for evaluation of redness of breath, chest tightness - Patient states he finished his last dose of prednisone  on Sunday and since then has been having increased work of breathing with some chest tightness and wheezing - No leg swelling - Feels he is having a flare of his COPD - He is taking all of his medications as prescribed - No fever, cough - States he may be dehydrated  Per chart review, patient is very well-known to our emergency department with multiple frequent visits for chest pain.  Last admitted 11/20-11/24/2025 for evaluation of chest pain as well as concern for COPD exacerbation.  Chest pain felt to be likely MSK in origin.      Physical Exam   Triage Vital Signs: ED Triage Vitals  Encounter Vitals Group     BP 10/16/24 1856 (!) 149/108     Girls Systolic BP Percentile --      Girls Diastolic BP Percentile --      Boys Systolic BP Percentile --      Boys Diastolic BP Percentile --      Pulse Rate 10/16/24 1856 (!) 120     Resp 10/16/24 1856 18     Temp 10/16/24 1856 97.6 F (36.4 C)     Temp Source 10/16/24 1856 Oral     SpO2 10/16/24 1856 99 %     Weight 10/16/24 1857 169 lb 12.1 oz (77 kg)     Height 10/16/24 1857 5' 7 (1.702 m)     Head Circumference --      Peak Flow --      Pain Score --      Pain Loc --      Pain Education --      Exclude from Growth Chart --     Most recent vital signs: Vitals:   10/16/24 2300 10/16/24 2307  BP: (!) 159/128   Pulse: (!) 128   Resp: 12    Temp:  97.8 F (36.6 C)  SpO2: 100%      General: Awake, mild respiratory distress CV:  Good peripheral perfusion. RRR, heart rate about 100 at time of my eval Resp:  Mild tachypnea, speaking 5 word sentences, notable in turn expiratory wheezing throughout, no focal coarse breath sounds Abd:  No distention. Nontender to deep palpation throughout Other:  No lower extremity edema   ED Results / Procedures / Treatments   Labs (all labs ordered are listed, but only abnormal results are displayed) Labs Reviewed  BASIC METABOLIC PANEL WITH GFR - Abnormal; Notable for the following components:      Result Value   Potassium 3.4 (*)    Glucose, Bld 188 (*)    All other components within normal limits  CBC - Abnormal; Notable for the following components:   WBC 10.8 (*)    RBC 6.13 (*)    Hemoglobin 18.3 (*)    HCT 56.2 (*)  All other components within normal limits  TROPONIN T, HIGH SENSITIVITY - Abnormal; Notable for the following components:   Troponin T High Sensitivity 20 (*)    All other components within normal limits  TROPONIN T, HIGH SENSITIVITY - Abnormal; Notable for the following components:   Troponin T High Sensitivity 21 (*)    All other components within normal limits  RESP PANEL BY RT-PCR (RSV, FLU A&B, COVID)  [id number]     EKG  Ecg = A-fib versus sinus rhythm with PVCs, rate 122, no gross ST elevation or depression, no significant repolarization abnormality, right axis deviation, normal intervals.  No clear evidence of ischemia nor arrhythmia interpretation.   RADIOLOGY Radiology interpreted by myself and radiology report reviewed.  No acute pathology identified.    PROCEDURES:  Critical Care performed: No  Procedures   MEDICATIONS ORDERED IN ED: Medications  sodium chloride  0.9 % bolus 500 mL (500 mLs Intravenous New Bag/Given 10/16/24 2307)  methylPREDNISolone  sodium succinate (SOLU-MEDROL ) 125 mg/2 mL injection 125 mg (125 mg Intravenous Given  10/16/24 2311)  ipratropium-albuterol  (DUONEB) 0.5-2.5 (3) MG/3ML nebulizer solution 3 mL (3 mLs Nebulization Given 10/16/24 2309)  ipratropium-albuterol  (DUONEB) 0.5-2.5 (3) MG/3ML nebulizer solution 3 mL (3 mLs Nebulization Given 10/16/24 2309)     IMPRESSION / MDM / ASSESSMENT AND PLAN / ED COURSE  I reviewed the triage vital signs and the nursing notes.                              DDX/MDM/AP: Differential diagnosis includes, but is not limited to, COPD exacerbation, consider underlying pneumonia or viral syndrome including influenza or COVID-19, consider ACS, do not clinically suspect CHF.  Do not clinically suspect PE in this anticoagulated patient.  Plan: - DuoNeb - IV Methylpred - Small bolus IV fluid Chest x-ray Labs - Cardiac monitor -  reassess  Patient's presentation is most consistent with acute presentation with potential threat to life or bodily function.  The patient is on the cardiac monitor to evaluate for evidence of arrhythmia and/or significant heart rate changes.  ED course below.  Treating with DuoNebs, steroids, small bolus IV fluid.  Initial troponin very mildly elevated, EKG nonischemic.  Heart rate around 100 at time of my initial eval.  Signed out to oncoming ED provider pending repeat troponin and reevaluation after treatment.  Clinical Course as of 10/17/24 0015  Mon Oct 16, 2024  2222 CXR: IMPRESSION: 1. Evidence of prior median sternotomy/CABG. 2. No active cardiopulmonary disease.   [MM]    Clinical Course User Index [MM] Clarine Ozell LABOR, MD     FINAL CLINICAL IMPRESSION(S) / ED DIAGNOSES   Final diagnoses:  COPD exacerbation (HCC)     Rx / DC Orders   ED Discharge Orders     None        Note:  This document was prepared using Dragon voice recognition software and may include unintentional dictation errors.   Clarine Ozell LABOR, MD 10/17/24 [phone]

## 2024-10-17 ENCOUNTER — Other Ambulatory Visit: Payer: Self-pay

## 2024-10-17 DIAGNOSIS — I2510 Atherosclerotic heart disease of native coronary artery without angina pectoris: Secondary | ICD-10-CM | POA: Insufficient documentation

## 2024-10-17 DIAGNOSIS — E119 Type 2 diabetes mellitus without complications: Secondary | ICD-10-CM | POA: Diagnosis not present

## 2024-10-17 DIAGNOSIS — J441 Chronic obstructive pulmonary disease with (acute) exacerbation: Secondary | ICD-10-CM | POA: Diagnosis not present

## 2024-10-17 DIAGNOSIS — I482 Chronic atrial fibrillation, unspecified: Secondary | ICD-10-CM | POA: Insufficient documentation

## 2024-10-17 DIAGNOSIS — F419 Anxiety disorder, unspecified: Secondary | ICD-10-CM | POA: Diagnosis not present

## 2024-10-17 DIAGNOSIS — F32A Depression, unspecified: Secondary | ICD-10-CM

## 2024-10-17 DIAGNOSIS — K219 Gastro-esophageal reflux disease without esophagitis: Secondary | ICD-10-CM | POA: Diagnosis not present

## 2024-10-17 LAB — BASIC METABOLIC PANEL WITH GFR
Anion gap: 14 (ref 5–15)
BUN: 16 mg/dL (ref 8–23)
CO2: 23 mmol/L (ref 22–32)
Calcium: 8.7 mg/dL — ABNORMAL LOW (ref 8.9–10.3)
Chloride: 105 mmol/L (ref 98–111)
Creatinine, Ser: 0.84 mg/dL (ref 0.61–1.24)
GFR, Estimated: >60 mL/min (ref >60)
Glucose, Bld: 224 mg/dL — ABNORMAL HIGH (ref 70–99)
Potassium: 3.3 mmol/L — ABNORMAL LOW (ref 3.5–5.1)
Sodium: 143 mmol/L (ref 135–145)

## 2024-10-17 LAB — CBC
HCT: 53.8 % — ABNORMAL HIGH (ref 39.0–52.0)
Hemoglobin: 18 g/dL — ABNORMAL HIGH (ref 13.0–17.0)
MCH: 29.9 pg (ref 26.0–34.0)
MCHC: 33.5 g/dL (ref 30.0–36.0)
MCV: 89.2 fL (ref 80.0–100.0)
Platelets: 169 K/uL (ref 150–400)
RBC: 6.03 MIL/uL — ABNORMAL HIGH (ref 4.22–5.81)
RDW: 14.9 % (ref 11.5–15.5)
WBC: 9.6 K/uL (ref 4.0–10.5)
nRBC: 0 % (ref 0.0–0.2)

## 2024-10-17 LAB — GLUCOSE, CAPILLARY
Glucose-Capillary: 276 mg/dL — ABNORMAL HIGH (ref 70–99)
Glucose-Capillary: 427 mg/dL — ABNORMAL HIGH (ref 70–99)
Glucose-Capillary: 107 mg/dL — ABNORMAL HIGH (ref 70–99)
Glucose-Capillary: 274 mg/dL — ABNORMAL HIGH (ref 70–99)

## 2024-10-17 LAB — TROPONIN T, HIGH SENSITIVITY
Troponin T High Sensitivity: 16 ng/L (ref 0–19)
Troponin T High Sensitivity: <15 ng/L (ref 0–19)

## 2024-10-17 LAB — MAGNESIUM: Magnesium: 1.9 mg/dL (ref 1.7–2.4)

## 2024-10-17 LAB — RESP PANEL BY RT-PCR (RSV, FLU A&B, COVID)  RVPGX2
Influenza A by PCR: NEGATIVE
Influenza B by PCR: NEGATIVE
Resp Syncytial Virus by PCR: NEGATIVE
SARS Coronavirus 2 by RT PCR: NEGATIVE

## 2024-10-17 MED ORDER — ALPRAZOLAM 0.5 MG PO TABS
1.0000 mg | ORAL_TABLET | Freq: Three times a day (TID) | ORAL | Status: DC | PRN
  Administered 2024-10-17 – 2024-10-20 (×5): 1 mg via ORAL
  Filled 2024-10-17 (×5): qty 2

## 2024-10-17 MED ORDER — PREDNISONE 20 MG PO TABS
40.0000 mg | ORAL_TABLET | Freq: Every day | ORAL | Status: AC
  Administered 2024-10-18 – 2024-10-21 (×4): 40 mg via ORAL
  Filled 2024-10-17 (×4): qty 2

## 2024-10-17 MED ORDER — ACETAMINOPHEN 650 MG RE SUPP: 650.0000 mg | Freq: Four times a day (QID) | RECTAL | Status: DC | PRN

## 2024-10-17 MED ORDER — ISOSORBIDE MONONITRATE ER 30 MG PO TB24
120.0000 mg | ORAL_TABLET | Freq: Every day | ORAL | Status: DC
  Administered 2024-10-17 – 2024-10-20 (×4): 120 mg via ORAL
  Filled 2024-10-17 (×4): qty 4

## 2024-10-17 MED ORDER — ACETAMINOPHEN 325 MG PO TABS
650.0000 mg | ORAL_TABLET | Freq: Four times a day (QID) | ORAL | Status: DC | PRN
  Filled 2024-10-17: qty 2

## 2024-10-17 MED ORDER — METHOCARBAMOL 500 MG PO TABS
500.0000 mg | ORAL_TABLET | Freq: Three times a day (TID) | ORAL | Status: DC | PRN
  Administered 2024-10-17 – 2024-10-21 (×5): 500 mg via ORAL
  Filled 2024-10-17 (×5): qty 1

## 2024-10-17 MED ORDER — NITROGLYCERIN 0.4 MG SL SUBL
0.4000 mg | SUBLINGUAL_TABLET | SUBLINGUAL | Status: DC | PRN
  Administered 2024-10-18 (×2): 0.4 mg via SUBLINGUAL
  Filled 2024-10-17 (×2): qty 1

## 2024-10-17 MED ORDER — IPRATROPIUM-ALBUTEROL 0.5-2.5 (3) MG/3ML IN SOLN
3.0000 mL | Freq: Four times a day (QID) | RESPIRATORY_TRACT | Status: DC
  Administered 2024-10-17: 3 mL via RESPIRATORY_TRACT
  Filled 2024-10-17: qty 3

## 2024-10-17 MED ORDER — INSULIN GLARGINE-YFGN 100 UNIT/ML ~~LOC~~ SOLN
10.0000 [IU] | Freq: Once | SUBCUTANEOUS | Status: AC
  Administered 2024-10-17: 10 [IU] via SUBCUTANEOUS
  Filled 2024-10-17: qty 0.1

## 2024-10-17 MED ORDER — HYDROMORPHONE HCL 1 MG/ML IJ SOLN
1.0000 mg | INTRAMUSCULAR | Status: DC | PRN
  Administered 2024-10-17 – 2024-10-18 (×4): 1 mg via INTRAVENOUS
  Filled 2024-10-17 (×4): qty 1

## 2024-10-17 MED ORDER — OMEGA-3-ACID ETHYL ESTERS 1 G PO CAPS
1.0000 g | ORAL_CAPSULE | Freq: Every day | ORAL | Status: DC
  Administered 2024-10-17 – 2024-10-21 (×5): 1 g via ORAL
  Filled 2024-10-17 (×5): qty 1

## 2024-10-17 MED ORDER — VITAMIN D 25 MCG (1000 UNIT) PO TABS
1000.0000 [IU] | ORAL_TABLET | Freq: Every day | ORAL | Status: DC
  Administered 2024-10-17 – 2024-10-21 (×5): 1000 [IU] via ORAL
  Filled 2024-10-17 (×5): qty 1

## 2024-10-17 MED ORDER — POTASSIUM CHLORIDE 10 MEQ/100ML IV SOLN
10.0000 meq | INTRAVENOUS | Status: AC
  Administered 2024-10-17 (×3): 10 meq via INTRAVENOUS
  Filled 2024-10-17 (×2): qty 100

## 2024-10-17 MED ORDER — METHYLPREDNISOLONE SODIUM SUCC 40 MG IJ SOLR
40.0000 mg | Freq: Two times a day (BID) | INTRAMUSCULAR | Status: AC
  Administered 2024-10-17 (×2): 40 mg via INTRAVENOUS
  Filled 2024-10-17 (×2): qty 1

## 2024-10-17 MED ORDER — GUAIFENESIN ER 600 MG PO TB12
600.0000 mg | ORAL_TABLET | Freq: Two times a day (BID) | ORAL | Status: DC
  Administered 2024-10-17 – 2024-10-21 (×9): 600 mg via ORAL
  Filled 2024-10-17 (×9): qty 1

## 2024-10-17 MED ORDER — METOPROLOL TARTRATE 5 MG/5ML IV SOLN
5.0000 mg | Freq: Once | INTRAVENOUS | Status: AC
  Administered 2024-10-17: 5 mg via INTRAVENOUS
  Filled 2024-10-17: qty 5

## 2024-10-17 MED ORDER — OXYCODONE-ACETAMINOPHEN 5-325 MG PO TABS
2.0000 | ORAL_TABLET | Freq: Once | ORAL | Status: AC
  Administered 2024-10-17: 2 via ORAL
  Filled 2024-10-17: qty 2

## 2024-10-17 MED ORDER — TRAZODONE HCL 50 MG PO TABS
150.0000 mg | ORAL_TABLET | Freq: Every evening | ORAL | Status: DC | PRN
  Administered 2024-10-17 – 2024-10-19 (×2): 150 mg via ORAL
  Filled 2024-10-17 (×2): qty 1

## 2024-10-17 MED ORDER — INSULIN ASPART 100 UNIT/ML IJ SOLN
0.0000 [IU] | Freq: Three times a day (TID) | INTRAMUSCULAR | Status: DC
  Administered 2024-10-17: 11 [IU] via SUBCUTANEOUS
  Administered 2024-10-17: 20 [IU] via SUBCUTANEOUS
  Administered 2024-10-18: 4 [IU] via SUBCUTANEOUS
  Administered 2024-10-18: 7 [IU] via SUBCUTANEOUS
  Administered 2024-10-18 – 2024-10-19 (×2): 4 [IU] via SUBCUTANEOUS
  Administered 2024-10-19: 3 [IU] via SUBCUTANEOUS
  Administered 2024-10-19: 11 [IU] via SUBCUTANEOUS
  Administered 2024-10-20 – 2024-10-21 (×3): 3 [IU] via SUBCUTANEOUS
  Filled 2024-10-17: qty 3
  Filled 2024-10-17: qty 4
  Filled 2024-10-17: qty 7
  Filled 2024-10-17: qty 1
  Filled 2024-10-17: qty 7
  Filled 2024-10-17: qty 8
  Filled 2024-10-17: qty 3
  Filled 2024-10-17: qty 11
  Filled 2024-10-17: qty 2
  Filled 2024-10-17: qty 11
  Filled 2024-10-17: qty 3
  Filled 2024-10-17: qty 20
  Filled 2024-10-17: qty 3

## 2024-10-17 MED ORDER — METOPROLOL SUCCINATE ER 25 MG PO TB24
37.5000 mg | ORAL_TABLET | Freq: Every day | ORAL | Status: DC
  Administered 2024-10-17 – 2024-10-21 (×5): 37.5 mg via ORAL
  Filled 2024-10-17 (×5): qty 2

## 2024-10-17 MED ORDER — ENOXAPARIN SODIUM 40 MG/0.4ML IJ SOSY: 40.0000 mg | PREFILLED_SYRINGE | INTRAMUSCULAR | Status: DC

## 2024-10-17 MED ORDER — OXYCODONE-ACETAMINOPHEN 5-325 MG PO TABS
1.0000 | ORAL_TABLET | ORAL | Status: DC | PRN
  Administered 2024-10-17 – 2024-10-20 (×12): 1 via ORAL
  Filled 2024-10-17 (×13): qty 1

## 2024-10-17 MED ORDER — FENTANYL CITRATE (PF) 50 MCG/ML IJ SOSY
50.0000 ug | PREFILLED_SYRINGE | Freq: Once | INTRAMUSCULAR | Status: AC
  Administered 2024-10-17: 50 ug via INTRAVENOUS
  Filled 2024-10-17: qty 1

## 2024-10-17 MED ORDER — ATORVASTATIN CALCIUM 20 MG PO TABS
80.0000 mg | ORAL_TABLET | Freq: Every day | ORAL | Status: DC
  Administered 2024-10-17: 80 mg via ORAL
  Filled 2024-10-17: qty 4

## 2024-10-17 MED ORDER — VITAMIN B-12 1000 MCG PO TABS
1000.0000 ug | ORAL_TABLET | Freq: Every day | ORAL | Status: DC
  Administered 2024-10-17 – 2024-10-21 (×5): 1000 ug via ORAL
  Filled 2024-10-17 (×5): qty 1

## 2024-10-17 MED ORDER — LORATADINE 10 MG PO TABS
10.0000 mg | ORAL_TABLET | Freq: Every day | ORAL | Status: DC
  Administered 2024-10-17 – 2024-10-21 (×5): 10 mg via ORAL
  Filled 2024-10-17 (×5): qty 1

## 2024-10-17 MED ORDER — IPRATROPIUM-ALBUTEROL 0.5-2.5 (3) MG/3ML IN SOLN
3.0000 mL | Freq: Two times a day (BID) | RESPIRATORY_TRACT | Status: DC
  Administered 2024-10-17 – 2024-10-21 (×8): 3 mL via RESPIRATORY_TRACT
  Filled 2024-10-17 (×8): qty 3

## 2024-10-17 MED ORDER — ONDANSETRON HCL 4 MG/2ML IJ SOLN
4.0000 mg | Freq: Four times a day (QID) | INTRAMUSCULAR | Status: DC | PRN
  Administered 2024-10-17 – 2024-10-19 (×3): 4 mg via INTRAVENOUS
  Filled 2024-10-17 (×3): qty 2

## 2024-10-17 MED ORDER — DAPAGLIFLOZIN PROPANEDIOL 10 MG PO TABS
10.0000 mg | ORAL_TABLET | Freq: Every day | ORAL | Status: DC
  Administered 2024-10-17 – 2024-10-21 (×5): 10 mg via ORAL
  Filled 2024-10-17 (×5): qty 1

## 2024-10-17 MED ORDER — SACUBITRIL-VALSARTAN 49-51 MG PO TABS
1.0000 | ORAL_TABLET | Freq: Two times a day (BID) | ORAL | Status: DC
  Administered 2024-10-17 – 2024-10-20 (×7): 1 via ORAL
  Filled 2024-10-17 (×7): qty 1

## 2024-10-17 MED ORDER — OXYCODONE HCL 5 MG PO TABS
5.0000 mg | ORAL_TABLET | ORAL | Status: DC | PRN
  Administered 2024-10-17 – 2024-10-20 (×10): 5 mg via ORAL
  Filled 2024-10-17 (×11): qty 1

## 2024-10-17 MED ORDER — SODIUM CHLORIDE 0.9 % IV SOLN
1.0000 g | INTRAVENOUS | Status: AC
  Administered 2024-10-17 – 2024-10-21 (×5): 1 g via INTRAVENOUS
  Filled 2024-10-17 (×5): qty 10

## 2024-10-17 MED ORDER — SODIUM CHLORIDE 0.9 % IV SOLN: INTRAVENOUS | Status: AC

## 2024-10-17 MED ORDER — POLYSACCHARIDE IRON COMPLEX 150 MG PO CAPS
150.0000 mg | ORAL_CAPSULE | Freq: Every day | ORAL | Status: DC
  Administered 2024-10-17 – 2024-10-21 (×5): 150 mg via ORAL
  Filled 2024-10-17 (×5): qty 1

## 2024-10-17 MED ORDER — SODIUM CHLORIDE 0.9 % IV SOLN
500.0000 mg | INTRAVENOUS | Status: DC
  Administered 2024-10-17 – 2024-10-18 (×2): 500 mg via INTRAVENOUS
  Filled 2024-10-17 (×3): qty 5

## 2024-10-17 MED ORDER — RANOLAZINE ER 500 MG PO TB12
1000.0000 mg | ORAL_TABLET | Freq: Two times a day (BID) | ORAL | Status: DC
  Administered 2024-10-17 – 2024-10-21 (×9): 1000 mg via ORAL
  Filled 2024-10-17 (×9): qty 2

## 2024-10-17 MED ORDER — MAGNESIUM HYDROXIDE 400 MG/5ML PO SUSP
30.0000 mL | Freq: Every day | ORAL | Status: DC | PRN
  Administered 2024-10-20: 30 mL via ORAL
  Filled 2024-10-17: qty 30

## 2024-10-17 MED ORDER — ALLOPURINOL 100 MG PO TABS
300.0000 mg | ORAL_TABLET | Freq: Two times a day (BID) | ORAL | Status: DC
  Administered 2024-10-17 – 2024-10-21 (×9): 300 mg via ORAL
  Filled 2024-10-17 (×9): qty 3

## 2024-10-17 MED ORDER — PANTOPRAZOLE SODIUM 40 MG PO TBEC
40.0000 mg | DELAYED_RELEASE_TABLET | Freq: Two times a day (BID) | ORAL | Status: DC
  Administered 2024-10-17 – 2024-10-21 (×9): 40 mg via ORAL
  Filled 2024-10-17 (×9): qty 1

## 2024-10-17 MED ORDER — ONDANSETRON HCL 4 MG PO TABS: 4.0000 mg | ORAL_TABLET | Freq: Four times a day (QID) | ORAL | Status: DC | PRN

## 2024-10-17 MED ORDER — OXYCODONE-ACETAMINOPHEN 10-325 MG PO TABS: 1.0000 | ORAL_TABLET | ORAL | Status: DC | PRN

## 2024-10-17 MED ORDER — FAMOTIDINE 20 MG PO TABS
20.0000 mg | ORAL_TABLET | Freq: Two times a day (BID) | ORAL | Status: DC
  Administered 2024-10-17 – 2024-10-21 (×9): 20 mg via ORAL
  Filled 2024-10-17 (×9): qty 1

## 2024-10-17 MED ORDER — APIXABAN 5 MG PO TABS
5.0000 mg | ORAL_TABLET | Freq: Two times a day (BID) | ORAL | Status: DC
  Administered 2024-10-17 – 2024-10-21 (×9): 5 mg via ORAL
  Filled 2024-10-17 (×9): qty 1

## 2024-10-17 MED ORDER — HYDROCOD POLI-CHLORPHE POLI ER 10-8 MG/5ML PO SUER
5.0000 mL | Freq: Two times a day (BID) | ORAL | Status: DC | PRN
  Administered 2024-10-17 – 2024-10-20 (×4): 5 mL via ORAL
  Filled 2024-10-17 (×5): qty 5

## 2024-10-17 NOTE — Hospital Course (Signed)
 [name] is a 68 year old male with history of hypertension, GERD, insomnia, non-insulin -dependent diabetes mellitus, hyperlipidemia, atrial fibrillation on Eliquis .  12/1: Presents ED for chief concerns of chest pain. 12/2: Admitted to [facility] hospitalist service for COPD exacerbation.  12/2: I assumed care of the patient.  Noted glucose to be over 400.  Insulin  SSI is ordered.  One-time dose of Semglee  10 units ordered once.  AM team tomorrow to determine appropriate long-acting insulin  dosing daily.

## 2024-10-17 NOTE — Assessment & Plan Note (Signed)
-   Will continue Xanax  and trazodone .

## 2024-10-17 NOTE — ED Provider Notes (Signed)
-----------------------------------------   2:56 AM on 10/17/2024 -----------------------------------------  I took over care of this patient from Dr. Clarine.  The patient continues to report shortness of breath and demonstrates increased work of breathing.  Lung exam reveals some wheezing.  He is not requiring oxygen .  In addition, the patient now reports left arm numbness and tingling which she has had previously when he has had an MI.  He denies any weakness in the arm.  He has normal motor strength on my exam.  There is no evidence of acute CVA or TIA, especially given that he has had the symptoms before.  Repeat EKG shows no ischemic findings.  He is in atrial fibrillation his heart rate has been around 105-120, but with periods going up as high as 150.  I have ordered a dose of metoprolol .  Given the persistent shortness of breath and increased work of breathing, the persistent numbness and pain, as well as the intermittent rapid A-fib, he will need admission for further management.  I consulted Dr. Lawence from the hospitalist service; based on our discussion he agrees to evaluate the patient for admission.  ED ECG REPORT I, Waylon Cassis, the attending physician, personally viewed and interpreted this ECG.  Date: 10/17/2024 EKG Time: 0241 Rate: 109 Rhythm: Atrial fibrillation QRS Axis: normal Intervals: normal ST/T Wave abnormalities: Borderline repolarization abnormality Narrative Interpretation: no evidence of acute ischemia    Cassis Waylon, MD 10/17/24 [phone]

## 2024-10-17 NOTE — Assessment & Plan Note (Addendum)
 PPI therapy.

## 2024-10-17 NOTE — Progress Notes (Signed)
 PROGRESS NOTE  Lawrence [name]  FMW:[id number] DOB: March 11, 1956 DOA: 10/16/2024 PCP: [name], MD   [name] is a 68 year old male with history of hypertension, GERD, insomnia, non-insulin -dependent diabetes mellitus, hyperlipidemia, atrial fibrillation on Eliquis .  12/1: Presents ED for chief concerns of chest pain. 12/2: Admitted to [facility] service for COPD exacerbation.  12/2: I assumed care of the patient.  Noted glucose to be over 400.  Insulin  SSI is ordered.  One-time dose of Semglee  10 units ordered once.  AM team tomorrow to determine appropriate long-acting insulin  dosing daily.  Assessment & Plan:   Principal Problem:   COPD exacerbation (HCC) Active Problems:   GERD without esophagitis   Controlled type 2 diabetes mellitus without complication, without long-term current use of insulin  (HCC)   Anxiety and depression   Chronic atrial fibrillation with RVR (HCC)   Coronary artery disease   Assessment and Plan:  * COPD exacerbation (HCC) The patient will be admitted to a medically monitored bed. We will place the patient IV steroid therapy with IV Solu-Medrol  as well as nebulized bronchodilator therapy with duonebs q.i.d. and q.4 hours p.r.n.. Mucolytic therapy will be provided with Mucinex  and antibiotic therapy with IV Rocephin . O2 protocol will be followed. 10/17/2024: Day 2 of antibiotic (ceftriaxone ).  Added azithromycin  for anti-inflammatory benefits  GERD without esophagitis PPI therapy  Controlled type 2 diabetes mellitus without complication, without long-term current use of insulin  (HCC) Insulin  SSI Home metformin  will not be resumed on admission Insulin  10 units one-time dose ordered A.m. team to do calculate insulin  SSI for continued long-acting insulin  requirements  Coronary artery disease - Will continue statin therapy, Ranexa  as well  as beta-blocker therapy with Toprol -XL.  Chronic atrial fibrillation with RVR (HCC) - Will  continue Eliquis  and Toprol -XL.  Anxiety and depression - Will continue Xanax  and trazodone .  DVT prophylaxis: Apixaban  Code Status: DNR/DNI Family Communication: No Disposition Plan: Pending clinical course Level of care: Telemetry  Consultants:  None at this time  Procedures:  None  Antimicrobials: Ceftriaxone , azithromycin   Subjective:  At bedside, patient able to tell me his first last name, age, location, current calendar year.  He reports he feels still terrible.  He states he is not ready to go home or be discharged yet.  Objective: Vitals:   10/17/24 0427 10/17/24 0517 10/17/24 0823 10/17/24 1711  BP:  (!) 152/97 (!) 140/98 (!) 127/99  Pulse:  (!) 43 95 91  Resp:  20 18 18   Temp: 97.8 F (36.6 C) 98 F (36.7 C) (!) 97.3 F (36.3 C) 98.4 F (36.9 C)  TempSrc: Oral Oral Oral   SpO2:  100% 99% 100%  Weight:  83 kg    Height:  5' 7 (1.702 m)      Intake/Output Summary (Last 24 hours) at 10/17/2024 1717 Last data filed at 10/17/2024 1349 Gross per 24 hour  Intake 1641.9 ml  Output 1725 ml  Net -83.1 ml   Filed Weights   10/16/24 1857 10/17/24 0517  Weight: 77 kg 83 kg    Examination:  General exam: Appears calm and comfortable  Respiratory system: Clear to auscultation. Respiratory effort normal.  Mild wheezing on end expiratory effort Cardiovascular system: S1 & S2 heard, RRR. No JVD, murmurs, rubs, gallops or clicks. No pedal edema. Gastrointestinal system: Abdomen is nondistended, soft and nontender. No organomegaly or masses felt. Normal bowel sounds heard. Central nervous system: Alert and oriented. No focal neurological deficits. Extremities: Symmetric 5 x 5 power.  Skin: No rashes, lesions or ulcers Psychiatry: Judgement and insight appear normal. Mood & affect appropriate.   Data Reviewed: I have personally reviewed following labs and imaging studies  CBC: Recent Labs  Lab 10/16/24 1859 10/17/24 0430  WBC 10.8* 9.6  HGB 18.3* 18.0*   HCT 56.2* 53.8*  MCV 91.7 89.2  PLT 179 169   Basic Metabolic Panel: Recent Labs  Lab 10/16/24 1859 10/17/24 0430 10/17/24 0735  NA 140 143  --   K 3.4* 3.3*  --   CL 102 105  --   CO2 25 23  --   GLUCOSE 188* 224*  --   BUN 17 16  --   CREATININE 0.95 0.84  --   CALCIUM  9.3 8.7*  --   MG  --   --  1.9   GFR: Estimated Creatinine Clearance: 84.4 mL/min (by C-G formula based on SCr of 0.84 mg/dL).  CBG: Recent Labs  Lab 10/17/24 0825 10/17/24 1212  GLUCAP 276* 427*   Recent Results (from the past 240 hours)  Resp panel by RT-PCR (RSV, Flu A&B, Covid) Anterior Nasal Swab     Status: None   Collection Time: 10/16/24 10:24 PM   Specimen: Anterior Nasal Swab  Result Value Ref Range Status   SARS Coronavirus 2 by RT PCR NEGATIVE NEGATIVE Final    Comment: (NOTE) SARS-CoV-2 target nucleic acids are NOT DETECTED.  The SARS-CoV-2 RNA is generally detectable in upper respiratory specimens during the acute phase of infection. The lowest concentration of SARS-CoV-2 viral copies this assay can detect is 138 copies/mL. A negative result does not preclude SARS-Cov-2 infection and should not be used as the sole basis for treatment or other patient management decisions. A negative result may occur with  improper specimen collection/handling, submission of specimen other than nasopharyngeal swab, presence of viral mutation(s) within the areas targeted by this assay, and inadequate number of viral copies(<138 copies/mL). A negative result must be combined with clinical observations, patient history, and epidemiological information. The expected result is Negative.  Fact Sheet for Patients:  bloggercourse.com  Fact Sheet for Healthcare Providers:  seriousbroker.it  This test is no t yet approved or cleared by the [place]  FDA and  has been authorized for detection and/or diagnosis of SARS-CoV-2 by FDA under an Emergency  Use Authorization (EUA). This EUA will remain  in effect (meaning this test can be used) for the duration of the COVID-19 declaration under Section 564(b)(1) of the Act, 21 U.S.C.section 360bbb-3(b)(1), unless the authorization is terminated  or revoked sooner.       Influenza A by PCR NEGATIVE NEGATIVE Final   Influenza B by PCR NEGATIVE NEGATIVE Final    Comment: (NOTE) The Xpert Xpress SARS-CoV-2/FLU/RSV plus assay is intended as an aid in the diagnosis of influenza from Nasopharyngeal swab specimens and should not be used as a sole basis for treatment. Nasal washings and aspirates are unacceptable for Xpert Xpress SARS-CoV-2/FLU/RSV testing.  Fact Sheet for Patients: bloggercourse.com  Fact Sheet for Healthcare Providers: seriousbroker.it  This test is not yet approved or cleared by the [place]  FDA and has been authorized for detection and/or diagnosis of SARS-CoV-2 by FDA under an Emergency Use Authorization (EUA). This EUA will remain in effect (meaning this test can be used) for the duration of the COVID-19 declaration under Section 564(b)(1) of the Act, 21 U.S.C. section 360bbb-3(b)(1), unless the authorization is terminated or revoked.     Resp Syncytial Virus by PCR NEGATIVE NEGATIVE Final  Comment: (NOTE) Fact Sheet for Patients: bloggercourse.com  Fact Sheet for Healthcare Providers: seriousbroker.it  This test is not yet approved or cleared by the [place]  FDA and has been authorized for detection and/or diagnosis of SARS-CoV-2 by FDA under an Emergency Use Authorization (EUA). This EUA will remain in effect (meaning this test can be used) for the duration of the COVID-19 declaration under Section 564(b)(1) of the Act, 21 U.S.C. section 360bbb-3(b)(1), unless the authorization is terminated or revoked.  Performed at [facility], [address], [place], [place] [address]     Radiology Studies: DG Chest 2 View Result Date: 10/16/2024 CLINICAL DATA:  Midsternal chest pain. EXAM: CHEST - 2 VIEW COMPARISON:  October 05, 2024 FINDINGS: Multiple sternal wires and vascular clips are noted. The heart size and mediastinal contours are within normal limits. A coronary artery stent is in place. There is marked severity calcification of the aortic arch. Both lungs are clear. The visualized skeletal structures are unremarkable. IMPRESSION: 1. Evidence of prior median sternotomy/CABG. 2. No active cardiopulmonary disease. Electronically Signed   By: Suzen Dials M.D.   On: 10/16/2024 20:19   Scheduled Meds:  allopurinol   300 mg Oral BID   apixaban   5 mg Oral BID   atorvastatin   80 mg Oral QHS   cholecalciferol   1,000 Units Oral Daily   cyanocobalamin   1,000 mcg Oral Daily   dapagliflozin  propanediol  10 mg Oral Daily   famotidine   20 mg Oral BID   guaiFENesin   600 mg Oral BID   insulin  aspart  0-20 Units Subcutaneous TID WC   ipratropium-albuterol   3 mL Nebulization BID   iron  polysaccharides  150 mg Oral Daily   isosorbide  mononitrate  120 mg Oral Daily   loratadine   10 mg Oral Daily   methylPREDNISolone  (SOLU-MEDROL ) injection  40 mg Intravenous Q12H   Followed by   NOREEN ON 10/18/2024] predniSONE   40 mg Oral Q breakfast   metoprolol  succinate  37.5 mg Oral Daily   omega-3 acid ethyl esters  1 g Oral Daily   pantoprazole   40 mg Oral BID   ranolazine   1,000 mg Oral BID   sacubitril -valsartan   1 tablet Oral BID   Continuous Infusions:  sodium chloride  100 mL/hr at 10/17/24 0423   azithromycin      cefTRIAXone  (ROCEPHIN )  IV Stopped (10/17/24 0529)    LOS: 0 days   Time spent: 50 minutes  Dr. Sherre [facility] If 7PM-7AM, please contact night-coverage 10/17/2024, 5:17 PM

## 2024-10-17 NOTE — H&P (Signed)
 [place]   PATIENT NAME: Lawrence [name]    MR#:  [id number]  DATE OF BIRTH:  1956-10-23  DATE OF ADMISSION:  10/16/2024  PRIMARY CARE PHYSICIAN: [name], MD   Patient is coming from: Home  REQUESTING/REFERRING PHYSICIAN: Siadecki, Sebastien, MD  CHIEF COMPLAINT:   Chief Complaint  Patient presents with   Chest Pain    HISTORY OF PRESENT ILLNESS:  Lawrence [name] is a 68 y.o. Caucasian male with medical history significant for  for CAD (s/p of stent x 4/CABG 2016), A-fib on Eliquis , HFrEF (EF 35 to 40%, 07/2024), PSVT/frequent PVCs, HTN,DM, COPD on 2L O2, OSA not using CPAP, multiple prior hospitalizations related to chest pain/CHF/COPD, most recently 6 weeks ago (10/5 to 10/09/2024, [facility], who presented to the emergency room with acute onset of worsening dyspnea with associated cough productive of yellow sputum as well as wheezing without fever or chills.  He has been having diaphoresis though.  Admits to nausea without vomiting or diarrhea.  He has been having chest pain with cough without palpitations.  He has been having abdominal pain with cough.  No dysuria, oliguria or hematuria or flank pain.  No bleeding diathesis.  ED Course: When he came to the ER, BP was 156/100 with heart rate to 68 and later 132 otherwise normal vital signs.  CMP revealed hypokalemia of 3.4 and blood glucose of 188 high sensitive troponin was 20 and later 21.  CBC showed WBCs of 10.8 with hemoconcentration.  Respiratory panel came back negative. EKG as reviewed by me : Atrial fibrillation with a rapid ventricular response with rate of 122, with Q waves anteroseptally and borderline repolarization abnormality. Imaging: Portable chest x-ray showed no acute cardiopulmonary disease.  It showed his median sternotomy/CABG PAST MEDICAL HISTORY:   Past Medical History:  Diagnosis Date   A-fib (HCC)    Anemia    Anginal pain    Anxiety    Arthritis    Asthma    Atrial fibrillation, chronic  (HCC) 10/19/2023   CAD (coronary artery disease)    a. 2002 CABGx2 (LIMA->LAD, VG->VG->OM1);  b. 09/2012 DES->OM;  c. 03/2015 PTCA of LAD [facility]) in setting of atretic LIMA; d. 05/2015 Cath [facility]): nonobs dzs; e. 06/2015 Cath ([facility]): LM nl, LAD 45p/d ISR, 50d, D1/2 small, LCX 50p/d ISR, OM1 70ost, 30 ISR, VG->OM1 50ost, 62m, LIMA->LAD 99p/d - atretic, RCA dom, nl; f.cath 10/16: 40-50%(FFR 0.90) pLAD, 75% (FFR 0.77) mLAD s/p PCI/DES, oRCA 40% (FFR0.95)   Cancer (HCC)    SKIN CANCER ON BACK   Celiac disease    Chronic diastolic CHF (congestive heart failure) (HCC)    a. 06/2009 Echo: EF 60-65%, Gr 1 DD, triv AI, mildly dil LA, nl RV.   COPD (chronic obstructive pulmonary disease) (HCC)    a. Chronic bronchitis and emphysema.   DDD (degenerative disc disease), lumbar    Diverticulosis    Dysrhythmia    Essential hypertension    GERD (gastroesophageal reflux disease)    History of hiatal hernia    History of kidney stones    H/O   History of tobacco abuse    a. Quit 2014.   Myocardial infarction [facility]) 2002   4 STENTS   Pancreatitis    PSVT (paroxysmal supraventricular tachycardia)    a. 10/2012 Noted on Zio Patch.   RLL pneumonia 05/30/2024   Sleep apnea    LOST CORD TO CPAP -ONLY 02 @ BEDTIME   Tubular adenoma of  colon    Type II diabetes mellitus (HCC)     PAST SURGICAL HISTORY:   Past Surgical History:  Procedure Laterality Date   BYPASS GRAFT     CARDIAC CATHETERIZATION N/A 07/12/2015   rocedure: Left Heart Cath and Cors/Grafts Angiography;  Surgeon: Victory LELON Sharps, MD;  Location: [name] INVASIVE CV LAB;  Service: Cardiovascular;  Laterality: N/A;   CARDIAC CATHETERIZATION Right 10/07/2015   Procedure: Left Heart Cath and Cors/Grafts Angiography;  Surgeon: Denyse DELENA Bathe, MD;  Location: [facility] INVASIVE CV LAB;  Service: Cardiovascular;  Laterality: Right;   CARDIAC CATHETERIZATION N/A 04/06/2016   Procedure: Left Heart Cath and Coronary Angiography;  Surgeon: Cara JONETTA Lovelace, MD;   Location: [facility] INVASIVE CV LAB;  Service: Cardiovascular;  Laterality: N/A;   CARDIAC CATHETERIZATION  04/06/2016   Procedure: Bypass Graft Angiography;  Surgeon: Cara JONETTA Lovelace, MD;  Location: [facility] INVASIVE CV LAB;  Service: Cardiovascular;;   CARDIAC CATHETERIZATION N/A 11/02/2016   Procedure: Left Heart Cath and Cors/Grafts Angiography and possible PCI;  Surgeon: Cara JONETTA Lovelace, MD;  Location: [facility] INVASIVE CV LAB;  Service: Cardiovascular;  Laterality: N/A;   CARDIAC CATHETERIZATION N/A 11/02/2016   Procedure: Coronary Stent Intervention;  Surgeon: Cara JONETTA Lovelace, MD;  Location: [facility] INVASIVE CV LAB;  Service: Cardiovascular;  Laterality: N/A;   CARDIOVERSION N/A 06/29/2022   Procedure: CARDIOVERSION;  Surgeon: Hester Wolm PARAS, MD;  Location: [facility] ORS;  Service: Cardiovascular;  Laterality: N/A;   CHOLECYSTECTOMY     CIRCUMCISION N/A 06/09/2019   Procedure: CIRCUMCISION ADULT;  Surgeon: Francisca Redell BROCKS, MD;  Location: [facility] ORS;  Service: Urology;  Laterality: N/A;   COLONOSCOPY WITH PROPOFOL  N/A 04/01/2018   Procedure: COLONOSCOPY WITH PROPOFOL ;  Surgeon: Viktoria Lamar DASEN, MD;  Location: [facility] ENDOSCOPY;  Service: Endoscopy;  Laterality: N/A;   COLONOSCOPY WITH PROPOFOL  N/A 05/01/2023   Procedure: COLONOSCOPY WITH PROPOFOL ;  Surgeon: Unk Corinn Skiff, MD;  Location: [facility] ENDOSCOPY;  Service: Gastroenterology;  Laterality: N/A;   ESOPHAGEAL DILATION     ESOPHAGOGASTRODUODENOSCOPY (EGD) WITH PROPOFOL  N/A 04/01/2018   Procedure: ESOPHAGOGASTRODUODENOSCOPY (EGD) WITH PROPOFOL ;  Surgeon: Viktoria Lamar DASEN, MD;  Location: [facility] ENDOSCOPY;  Service: Endoscopy;  Laterality: N/A;   ESOPHAGOGASTRODUODENOSCOPY (EGD) WITH PROPOFOL  N/A 05/01/2023   Procedure: ESOPHAGOGASTRODUODENOSCOPY (EGD) WITH PROPOFOL ;  Surgeon: Unk Corinn Skiff, MD;  Location: [facility] ENDOSCOPY;  Service: Gastroenterology;  Laterality: N/A;   GIVENS CAPSULE STUDY  05/01/2023   Procedure: GIVENS CAPSULE STUDY;  Surgeon: Unk Corinn Skiff, MD;  Location: [facility] ENDOSCOPY;  Service: Gastroenterology;;   LEFT HEART CATH AND CORS/GRAFTS ANGIOGRAPHY N/A 06/12/2019   Procedure: LEFT HEART CATH AND CORS/GRAFTS ANGIOGRAPHY;  Surgeon: Bosie Vinie DELENA, MD;  Location: [facility] INVASIVE CV LAB;  Service: Cardiovascular;  Laterality: N/A;   LEFT HEART CATH AND CORS/GRAFTS ANGIOGRAPHY N/A 03/11/2020   Procedure: LEFT HEART CATH AND CORS/GRAFTS ANGIOGRAPHY;  Surgeon: Ammon Blunt, MD;  Location: [facility] INVASIVE CV LAB;  Service: Cardiovascular;  Laterality: N/A;   LEFT HEART CATH AND CORS/GRAFTS ANGIOGRAPHY N/A 05/01/2021   Procedure: LEFT HEART CATH AND CORS/GRAFTS ANGIOGRAPHY;  Surgeon: Hester Wolm PARAS, MD;  Location: [facility] INVASIVE CV LAB;  Service: Cardiovascular;  Laterality: N/A;   LEFT HEART CATH AND CORS/GRAFTS ANGIOGRAPHY N/A 11/02/2022   Procedure: LEFT HEART CATH AND CORS/GRAFTS ANGIOGRAPHY;  Surgeon: Lovelace Cara JONETTA, MD;  Location: [facility] INVASIVE CV LAB;  Service: Cardiovascular;  Laterality: N/A;   TONSILLECTOMY     VASCULAR SURGERY      SOCIAL HISTORY:   Social History  Tobacco Use   Smoking status: Former    Current packs/day: 0.00    Average packs/day: 2.0 packs/day for 51.0 years (102.0 ttl pk-yrs)    Types: Cigarettes    Start date: 04/22/1962    Quit date: 04/22/2013    Years since quitting: 11.4   Smokeless tobacco: Never   Tobacco comments:    Reports not smoking for approx 8 years.  Substance Use Topics   Alcohol use: No    Comment: Hx Alcohol Abuse    FAMILY HISTORY:   Family History  Problem Relation Age of Onset   Heart attack Mother    Depression Mother    Heart disease Mother    COPD Mother    Hypertension Mother    Heart attack Father    Diabetes Father    Depression Father    Heart disease Father    Cirrhosis Father    Parkinson's disease Brother     DRUG ALLERGIES:   Allergies  Allergen Reactions   Demerol  [Meperidine Hcl]    Demerol [Meperidine] Hives   Morphine       Pill form not tolerated. Can have fluid form   Sulfa Antibiotics Hives   Albuterol  Other (See Comments) and Palpitations    Pt currently uses this medication.   Empagliflozin  Other (See Comments) and Hives    Perineal pain  Other Reaction(s): Other (See Comments)   Morphine  Sulfate Nausea And Vomiting, Rash and Other (See Comments)    Pt states that he is only allergic to the tablet form of this medication.      REVIEW OF SYSTEMS:   ROS As per history of present illness. All pertinent systems were reviewed above. Constitutional, HEENT, cardiovascular, respiratory, GI, GU, musculoskeletal, neuro, psychiatric, endocrine, integumentary and hematologic systems were reviewed and are otherwise negative/unremarkable except for positive findings mentioned above in the HPI.   MEDICATIONS AT HOME:   Prior to Admission medications   Medication Sig Start Date End Date Taking? Authorizing Provider  Accu-Chek Softclix Lancets lancets Use as instructed to check sugar daily for type 2 diabetes. 03/03/21   [name], MD  acetaminophen  (TYLENOL ) 325 MG tablet Take 2 tablets (650 mg total) by mouth every 6 (six) hours as needed for mild pain (pain score 1-3) or fever (or Fever >/= 101). 08/15/24   Dorinda Drue DASEN, MD  albuterol  (PROVENTIL ) (2.5 MG/3ML) 0.083% nebulizer solution Take 3 mLs (2.5 mg total) by nebulization every 6 (six) hours as needed for wheezing or shortness of breath. 05/26/24   [name], MD  allopurinol  (ZYLOPRIM ) 300 MG tablet TAKE 1 TABLET BY MOUTH TWICE A DAY 04/12/24   [name], MD  ALPRAZolam  (XANAX ) 1 MG tablet TAKE 1 TABLET BY MOUTH 3 TIMES A DAY AS NEEDED 09/16/24   [name], MD  apixaban  (ELIQUIS ) 5 MG TABS tablet Take 1 tablet (5 mg total) by mouth 2 (two) times daily. 10/04/23   [name], MD  atorvastatin  (LIPITOR ) 80 MG tablet TAKE 1 TABLET BY MOUTH AT BEDTIME 07/15/23   [name], MD  Blood Glucose Monitoring Suppl (ACCU-CHEK GUIDE)  w/Device KIT Use to check blood sugars as directed 12/01/23   [name], MD  Blood Pressure Monitor DEVI Use to check blood pressure daily 07/27/24   [name], MD  BREZTRI  AEROSPHERE 160-9-4.8 MCG/ACT AERO inhaler INHALE 2 PUFFS BY MOUTH INTO THE LUNGS 2TIMES DAILY 06/05/24   [name], MD  cetirizine  (ZYRTEC ) 10  MG tablet Take 1 tablet (10 mg total) by mouth at bedtime. 03/31/24   [name], MD  cholecalciferol  (VITAMIN D3) 25 MCG (1000 UNIT) tablet Take 1,000 Units by mouth daily.    [provider]  Cyanocobalamin  (VITAMIN B-12) 1000 MCG SUBL Place 1 tablet under the tongue daily.    [provider]  dapagliflozin  propanediol (FARXIGA ) 10 MG TABS tablet TAKE 1 TABLET BY MOUTH DAILY 03/28/24   [name], MD  famotidine  (PEPCID ) 20 MG tablet TAKE 1 TABLET BY MOUTH TWICE A DAY 08/23/24   [name], MD  glucose blood (ACCU-CHEK GUIDE) test strip Use as instructed to check sugar daily for type 2 diabetes. 03/03/21   [name], MD  iron  polysaccharides (FERREX 150) 150 MG capsule Take one capsul every Monday, Wednesday, and Friday 06/09/24   [name], MD  isosorbide  mononitrate (IMDUR ) 120 MG 24 hr tablet Take 1 tablet (120 mg total) by mouth daily. 02/10/24 02/09/25  Von Bellis, MD  metFORMIN  (GLUCOPHAGE -XR) 500 MG 24 hr tablet TAKE 1 TABLET BY MOUTH DAILY WITH EVENING MEAL 07/11/24   [name], MD  methocarbamol  (ROBAXIN ) 500 MG tablet TAKE 1 TABLET BY MOUTH EVERY 8 HOURS AS NEEDED FOR MUSCLE SPASMS 01/13/24   [name], MD  metoprolol  succinate (TOPROL -XL) 25 MG 24 hr tablet Take 1.5 tablets (37.5 mg total) by mouth daily. 10/09/24   Dorinda Drue DASEN, MD  nitroGLYCERIN  (NITROSTAT ) 0.4 MG SL tablet Place 1 tablet (0.4 mg total) under the tongue every 5 (five) minutes as needed for chest pain (Do not take if blood pressure is low, systolic BP<110). 10/21/23   [name], MD  omega-3 acid ethyl esters (LOVAZA ) 1 g capsule  TAKE 4 CAPSULES (4 GRAMS TOTAL) BY MOUTHDAILY 03/02/24   [name], MD  ondansetron  (ZOFRAN ) 4 MG tablet Take 1 tablet (4 mg total) by mouth every 8 (eight) hours as needed for nausea or vomiting. 06/23/24   [name], MD  oxyCODONE -acetaminophen  (PERCOCET) 10-325 MG tablet TAKE 1 TABLET BY MOUTH EVERY 4 HOURS AS NEEDED FOR PAIN 10/11/24   [name], MD  OXYGEN  Inhale 2 L into the lungs at bedtime as needed (for shortness of breath).    [provider]  pantoprazole  (PROTONIX ) 40 MG tablet TAKE 1 TABLET BY MOUTH 2 TIMES DAILY (30MINUTES BEFORE A MEAL) 01/12/24   [name], MD  ranolazine  (RANEXA ) 1000 MG SR tablet Take 1 tablet (1,000 mg total) by mouth 2 (two) times daily. 08/15/24   Dorinda Drue DASEN, MD  sacubitril -valsartan  (ENTRESTO ) 49-51 MG Take 1 tablet by mouth 2 (two) times daily. 07/27/24   [name], MD  traZODone  (DESYREL ) 150 MG tablet TAKE 1 TABLET BY MOUTH AT BEDTIME AS NEEDED FOR SLEEP. 09/10/24   [name], MD      VITAL SIGNS:  Blood pressure (!) 152/97, pulse (!) 43, temperature 98 F (36.7 C), temperature source Oral, resp. rate 20, height 5' 7 (1.702 m), weight 83 kg, SpO2 100%.  PHYSICAL EXAMINATION:  Physical Exam  GENERAL:  68 y.o.-year-old Caucasian male patient lying in the bed with mild respiratory distress with conversational dyspnea EYES: Pupils equal, round, reactive to light and accommodation. No scleral icterus. Extraocular muscles intact.  HEENT: Head atraumatic, normocephalic. Oropharynx and nasopharynx clear.  NECK:  Supple, no jugular venous distention. No thyroid  enlargement, no tenderness.  LUNGS: Diffuse expiratory airflow wheezes with harsh vesicular breathing. No use of  accessory muscles of respiration.  CARDIOVASCULAR: Regular rate and rhythm, S1, S2 normal. No murmurs, rubs, or gallops.  ABDOMEN: Soft, nondistended, nontender. Bowel sounds present. No organomegaly or mass.  EXTREMITIES: No pedal edema,  cyanosis, or clubbing.  NEUROLOGIC: Cranial nerves II through XII are intact. Muscle strength 5/5 in all extremities. Sensation intact. Gait not checked.  PSYCHIATRIC: The patient is alert and oriented x 3.  Normal affect and good eye contact. SKIN: No obvious rash, lesion, or ulcer.   LABORATORY PANEL:   CBC Recent Labs  Lab 10/17/24 0430  WBC 9.6  HGB 18.0*  HCT 53.8*  PLT 169   ------------------------------------------------------------------------------------------------------------------  Chemistries  Recent Labs  Lab 10/17/24 0430  NA 143  K 3.3*  CL 105  CO2 23  GLUCOSE 224*  BUN 16  CREATININE 0.84  CALCIUM  8.7*   ------------------------------------------------------------------------------------------------------------------  Cardiac Enzymes No results for input(s): TROPONINI in the last 168 hours. ------------------------------------------------------------------------------------------------------------------  RADIOLOGY:  DG Chest 2 View Result Date: 10/16/2024 CLINICAL DATA:  Midsternal chest pain. EXAM: CHEST - 2 VIEW COMPARISON:  October 05, 2024 FINDINGS: Multiple sternal wires and vascular clips are noted. The heart size and mediastinal contours are within normal limits. A coronary artery stent is in place. There is marked severity calcification of the aortic arch. Both lungs are clear. The visualized skeletal structures are unremarkable. IMPRESSION: 1. Evidence of prior median sternotomy/CABG. 2. No active cardiopulmonary disease. Electronically Signed   By: Suzen Dials M.D.   On: 10/16/2024 20:19      IMPRESSION AND PLAN:  Assessment and Plan: * COPD exacerbation (HCC) - The patient will be admitted to a medically monitored bed. - We will place the patient IV steroid therapy with IV Solu-Medrol  as well as nebulized bronchodilator therapy with duonebs q.i.d. and q.4 hours p.r.n.SABRA - Mucolytic therapy will be provided with Mucinex  and  antibiotic therapy with IV Rocephin . - O2 protocol will be followed.   GERD without esophagitis - Will continue PPI therapy.  Controlled type 2 diabetes mellitus without complication, without long-term current use of insulin  (HCC) - The patient will be placed on supplement coverage with NovoLog . - Will hold off metformin .  Coronary artery disease - Will continue statin therapy, Ranexa  as well  as beta-blocker therapy with Toprol -XL.  Chronic atrial fibrillation with RVR (HCC) - Will continue Eliquis  and Toprol -XL.  Anxiety and depression - Will continue Xanax  and trazodone .       DVT prophylaxis: Lovenox .  Advanced Care Planning:  Code Status: The patient is DNR and DNI.  This was discussed with him. Family Communication:  The plan of care was discussed in details with the patient (and family). I answered all questions. The patient agreed to proceed with the above mentioned plan. Further management will depend upon hospital course. Disposition Plan: Back to previous home environment Consults called: none.  All the records are reviewed and case discussed with ED provider.  Status is: Observation  At the time of the admission, it appears that the appropriate admission status for this patient is inpatient.  This is judged to be reasonable and necessary in order to provide the required intensity of service to ensure the patient's safety given the presenting symptoms, physical exam findings and initial radiographic and laboratory data in the context of comorbid conditions.  The patient requires inpatient status due to high intensity of service, high risk of further deterioration and high frequency of surveillance required.  I certify that at the time of admission, it is my  clinical judgment that the patient will require inpatient hospital care extending more than 2 midnights.                            Dispo: The patient is from: Home              Anticipated d/c is to: Home               Patient currently is not medically stable to d/c.              Difficult to place patient: No  Madison DELENA Peaches M.D on 10/17/2024 at 6:45 AM  [facility]   From 7 PM-7 AM, contact night-coverage [email or web address]  CC: Primary care physician; [name], MD

## 2024-10-17 NOTE — Assessment & Plan Note (Addendum)
 The patient will be admitted to a medically monitored bed. We will place the patient IV steroid therapy with IV Solu-Medrol  as well as nebulized bronchodilator therapy with duonebs q.i.d. and q.4 hours p.r.n.. Mucolytic therapy will be provided with Mucinex  and antibiotic therapy with IV Rocephin . O2 protocol will be followed. 10/17/2024: Day 2 of antibiotic (ceftriaxone ).  Added azithromycin  for anti-inflammatory benefits

## 2024-10-17 NOTE — ED Notes (Signed)
 Pt called out stating that his left arm was going numb. MD made aware

## 2024-10-17 NOTE — Assessment & Plan Note (Addendum)
 Insulin  SS[facility] Home metformin  will not be resumed on admission Insulin  10 units one-time dose ordered A.m. team to do calculate insulin  [facility] for continued long-acting insulin  requirements

## 2024-10-17 NOTE — Plan of Care (Signed)

## 2024-10-17 NOTE — Discharge Instructions (Signed)
 Please contact [facility] for additional free/reduced cost medical care [phone] for [place] counties.

## 2024-10-17 NOTE — TOC Initial Note (Signed)
 Transition of Care [facility]) - Initial/Assessment Note    Patient Details  Name: Lawrence [name] MRN: [id number] Date of Birth: 1956-02-25  Transition of Care [facility]) CM/SW Contact:    Alfonso Rummer, LCSW Phone Number: 10/17/2024, 3:53 PM  Clinical Narrative:                 Completed toc readmit assessment. Pt reports he lives with grandson and has adequate family support. Pt reports he uses [facility]. Pt reports grandson provides transportation to medical appts. Pt reports having trouble affording medical bills pt application with medicaid is pending and information for congregational nursing is listed in pt AVS.     Barriers to Discharge: Continued Medical Work up   Patient Goals and CMS Choice            Expected Discharge Plan and Services                                              Prior Living Arrangements/Services     Patient language and need for interpreter reviewed:: No Do you feel safe going back to the place where you live?: Yes      Need for Family Participation in Patient Care: No (Comment) Care giver support system in place?: Yes (comment)   Criminal Activity/Legal Involvement Pertinent to Current Situation/Hospitalization: No - Comment as needed  Activities of Daily Living   ADL Screening (condition at time of admission) Independently performs ADLs?: Yes (appropriate for developmental age) Is the patient deaf or have difficulty hearing?: No Does the patient have difficulty seeing, even when wearing glasses/contacts?: No Does the patient have difficulty concentrating, remembering, or making decisions?: No  Permission Sought/Granted                  Emotional Assessment Appearance:: Appears stated age Attitude/Demeanor/Rapport: Engaged   Orientation: : Oriented to  Time, Oriented to Self, Oriented to Place, Oriented to Situation Alcohol / Substance Use: Never Used Psych Involvement: No  (comment)  Admission diagnosis:  COPD exacerbation (HCC) [J44.1] Patient Active Problem List   Diagnosis Date Noted   COPD exacerbation (HCC) 10/17/2024   Anxiety and depression 10/17/2024   Chronic atrial fibrillation with RVR (HCC) 10/17/2024   Coronary artery disease 10/17/2024   Hypocalcemia 10/06/2024   Protein-calorie malnutrition, severe 10/06/2024   Substernal precordial chest pain 10/05/2024   Right upper quadrant abdominal pain 08/14/2024   Elevated hemoglobin 08/14/2024   COPD (chronic obstructive pulmonary disease) (HCC) 06/10/2024   Bipolar disorder (HCC) 06/10/2024   Mild cognitive impairment 06/09/2024   Paroxysmal atrial fibrillation with RVR (HCC) 05/30/2024   Hypotension 10/18/2023   COPD with acute exacerbation (HCC) 10/18/2023   Generalized weakness 10/18/2023   Left hip pain 10/13/2023   Malfunction of continuous or biphasic positive airway pressure machine 10/13/2023   Overweight (BMI 25.0-29.9) 10/02/2023   Chest pain 10/01/2023   Near syncope 09/29/2023   Dyslipidemia 09/29/2023   Anxiety 09/29/2023   Atrial fibrillation, chronic (HCC) 09/29/2023   NSTEMI (non-ST elevated myocardial infarction) (HCC) 08/04/2023   CAD S/P percutaneous coronary angioplasty 08/04/2023   PAD (peripheral artery disease) 07/11/2023   Controlled type 2 diabetes mellitus without complication, without long-term current use of insulin  (HCC) 05/17/2023   Gout 02/22/2023   Hx of CABG    Chronic HFrEF (heart failure with reduced ejection fraction) (HCC)  Atherosclerosis of aorta 12/16/2020   Syncope 12/11/2020   Polycythemia 10/05/2020   Elevated troponin 03/07/2020   Chronic respiratory failure with hypoxia (HCC) 05/29/2019   Acute on chronic congestive heart failure (HCC) 04/18/2018   OSA (obstructive sleep apnea) 12/10/2015   Depression with anxiety 11/07/2015   BPH (benign prostatic hyperplasia) 08/01/2015   Achalasia 07/24/2014   GERD without esophagitis 06/07/2014    Mixed hyperlipidemia 04/09/2013   PCP:  [name], MD Pharmacy:   [facility] - [place], [place] - [address] [place] [address] Phone: [phone] Fax: [phone]  [facility] - [place], [place] - [address] [place] [address] Phone: [phone] Fax: [phone]  [id number] - [place], [place] - [address] AT [facility] OF [address] [place] [address] Phone: [phone] Fax: [phone]  [facility] - [facility] [address] [place] [address] Phone: [phone] Fax: [phone]     Social Drivers of Health (SDOH) Social History: SDOH Screenings   Food Insecurity: Food Insecurity Present (10/17/2024)  Housing: Low Risk  (10/17/2024)  Transportation Needs: No Transportation Needs (10/17/2024)  Utilities: Not At Risk (10/17/2024)  Alcohol Screen: Low Risk  (03/14/2024)  Depression (PHQ2-9): Low Risk  (09/15/2024)  Recent Concern: Depression (PHQ2-9) - High Risk (08/11/2024)  Financial Resource Strain: Low Risk (06/19/2024)   Received from [facility]  Physical Activity: Insufficiently Active (03/14/2024)  Social Connections: Socially Isolated (10/17/2024)  Stress: No Stress Concern Present (03/14/2024)  Recent Concern: Stress - Stress Concern Present (01/13/2024)  Tobacco Use: Medium Risk (10/16/2024)  Health Literacy: Adequate Health Literacy (03/14/2024)   SDOH Interventions:     Readmission Risk Interventions    09/30/2023   10:28 AM 08/05/2023   10:11 AM 07/13/2023   10:52 AM  Readmission Risk Prevention Plan  Transportation Screening Complete Complete Complete  Medication Review (RN Care Manager) Complete Complete Complete  PCP or Specialist appointment within 3-5 days of discharge Complete Complete Complete  SW Recovery Care/Counseling Consult Complete Complete Complete   Palliative Care Screening Not Applicable Not Applicable Not Applicable  Skilled Nursing Facility Not Applicable Not Applicable Not Applicable

## 2024-10-17 NOTE — Assessment & Plan Note (Addendum)
-   Will continue statin therapy, Ranexa  as well  as beta-blocker therapy with Toprol -XL.

## 2024-10-17 NOTE — Inpatient Diabetes Management (Signed)
 Inpatient Diabetes Program Recommendations  AACE/ADA: New Consensus Statement on Inpatient Glycemic Control  Target Ranges:  Prepandial:   less than 140 mg/dL      Peak postprandial:   less than 180 mg/dL (1-2 hours)      Critically ill patients:  140 - 180 mg/dL    Latest Reference Range & Units 10/17/24 08:25 10/17/24 12:12  Glucose-Capillary 70 - 99 mg/dL 723 (H) 572 (H)    Latest Reference Range & Units 10/16/24 18:59 10/17/24 04:30  Glucose 70 - 99 mg/dL 811 (H) 775 (H)   Review of Glycemic Control  Diabetes history: DM2 Outpatient Diabetes medications: Metformin  XR 500 mg QPM Current orders for Inpatient glycemic control: Novolog  0-20 units TID with meals, Farxiga  10 mg daily, Solumedrol 40 mg Q12H, Prednsione 40 mg QAM (to start 10/18/24)  Inpatient Diabetes Program Recommendations:    Insulin : Patient received Solumedrol 125 mg x1 on 12/1 and Solumedrol 40 mg today. CBG up to 427 mg/dl at 87:87 today.  Please consider ordering Semglee  10 units x1 now. Then follow glucose trends to decide if Semglee  needs to be ordered daily while ordered steroids.  Thanks, Earnie Gainer, RN, MSN, CDCES Diabetes Coordinator Inpatient Diabetes Program [phone] (Team Pager from 8am to 5pm)

## 2024-10-17 NOTE — Assessment & Plan Note (Signed)
-   Will continue Eliquis  and Toprol -XL.

## 2024-10-18 ENCOUNTER — Telehealth: Payer: Self-pay

## 2024-10-18 DIAGNOSIS — M79604 Pain in right leg: Secondary | ICD-10-CM | POA: Diagnosis not present

## 2024-10-18 DIAGNOSIS — J441 Chronic obstructive pulmonary disease with (acute) exacerbation: Secondary | ICD-10-CM | POA: Diagnosis not present

## 2024-10-18 LAB — GLUCOSE, CAPILLARY
Glucose-Capillary: 188 mg/dL — ABNORMAL HIGH (ref 70–99)
Glucose-Capillary: 215 mg/dL — ABNORMAL HIGH (ref 70–99)
Glucose-Capillary: 198 mg/dL — ABNORMAL HIGH (ref 70–99)
Glucose-Capillary: 162 mg/dL — ABNORMAL HIGH (ref 70–99)

## 2024-10-18 LAB — TROPONIN T, HIGH SENSITIVITY: Troponin T High Sensitivity: <15 ng/L (ref 0–19)

## 2024-10-18 LAB — MAGNESIUM: Magnesium: 2.1 mg/dL (ref 1.7–2.4)

## 2024-10-18 LAB — POTASSIUM: Potassium: 4.8 mmol/L (ref 3.5–5.1)

## 2024-10-18 MED ORDER — MORPHINE SULFATE (PF) 2 MG/ML IV SOLN
2.0000 mg | INTRAVENOUS | Status: DC | PRN
  Administered 2024-10-18 – 2024-10-19 (×5): 2 mg via INTRAVENOUS
  Filled 2024-10-18 (×5): qty 1

## 2024-10-18 MED ORDER — ATORVASTATIN CALCIUM 80 MG PO TABS
80.0000 mg | ORAL_TABLET | Freq: Every day | ORAL | Status: DC
  Administered 2024-10-18 – 2024-10-20 (×3): 80 mg via ORAL
  Filled 2024-10-18: qty 4
  Filled 2024-10-18 (×2): qty 1
  Filled 2024-10-18: qty 4
  Filled 2024-10-18: qty 1

## 2024-10-18 NOTE — Evaluation (Signed)
 Physical Therapy Evaluation Patient Details Name: Lawrence [name] MRN: [id number] DOB: [birth year] Today's Date: 10/18/2024  History of Present Illness  Lawrence [name] is a 68 y.o. Caucasian male with medical history significant for  for CAD (s/p of stent x 4/CABG 2016), A-fib on Eliquis , HFrEF (EF 35 to 40%, 07/2024), PSVT/frequent PVCs, HTN,DM, COPD on 2L O2, OSA not using CPAP, multiple prior hospitalizations related to chest pain/CHF/COPD, most recently 6 weeks ago (10/5 to 10/09/2024, [facility], who presented to the emergency room with acute onset of worsening dyspnea with associated cough productive of yellow sputum as well as wheezing without fever or chills.   Clinical Impression  Pt admitted with above diagnosis. Pt currently with functional limitations due to the deficits listed below (see PT Problem List).Pt received supine in bed agreeable to PT. Reports PTA being mod-I with rollator household and short community distances and mod-I with ADL's. Son assists with IADL's.   To date, pt on his baseline 2 L/min maintaining sats > 95% at rest and with mobility today. Pt with minimal participation but willing to t/f to recliner due to bed being soiled with urine. Pt is supervision for bed mobility with rail use, STS to RW with safe hand placement and SPT to recliner without issue. Encouraged gait in room but pt declining. Anticipate pt is close to baseline. Will continue to monitor and treat during admission to prevent further deconditioning and to progress functional mobility. Pt with all needs in recliner. Pt will continue to benefit from skilled PT services to maximize return to PLOF.      If plan is discharge home, recommend the following: A little help with walking and/or transfers;A little help with bathing/dressing/bathroom;Assistance with cooking/housework;Assist for transportation;Help with stairs or ramp for entrance   Can travel by private vehicle        Equipment Recommendations None  recommended by PT  Recommendations for Other Services       Functional Status Assessment Patient has had a recent decline in their functional status and demonstrates the ability to make significant improvements in function in a reasonable and predictable amount of time.     Precautions / Restrictions Precautions Precautions: Fall Recall of Precautions/Restrictions: Intact Restrictions Weight Bearing Restrictions Per Provider Order: No      Mobility  Bed Mobility Overal bed mobility: Modified Independent             General bed mobility comments: used rails Patient Response: Cooperative  Transfers Overall transfer level: Needs assistance Equipment used: Rolling walker (2 wheels) Transfers: Sit to/from Stand, Bed to chair/wheelchair/BSC Sit to Stand: Supervision   Step pivot transfers: Supervision       General transfer comment: Safe standing to RW and safe use during t/f to recliner.    Ambulation/Gait               General Gait Details: Pt declining gait attempts this date despite encouragement.  Stairs            Wheelchair Mobility     Tilt Bed Tilt Bed Patient Response: Cooperative  Modified Rankin (Stroke Patients Only)       Balance Overall balance assessment: Needs assistance Sitting-balance support: No upper extremity supported, Feet unsupported Sitting balance-Leahy Scale: Normal     Standing balance support: Reliant on assistive device for balance Standing balance-Leahy Scale: Fair  Pertinent Vitals/Pain Pain Assessment Pain Assessment: Faces Faces Pain Scale: Hurts little more Pain Location: lower back Pain Descriptors / Indicators: Discomfort Pain Intervention(s): Limited activity within patient's tolerance, Monitored during session    Home Living Family/patient expects to be discharged to:: Private residence Living Arrangements: Other relatives (grandson) Available Help at  Discharge: Family;Available 24 hours/day Type of Home: Apartment Home Access: Level entry       Home Layout: One level Home Equipment: Shower seat - built in;Rollator (4 wheels);BSC/3in1;Hospital bed;Grab bars - toilet;Grab bars - tub/shower Additional Comments: home O2. Reports needing new rollator but plans to pay out of pocket.    Prior Function Prior Level of Function : Independent/Modified Independent;History of Falls (last six months)             Mobility Comments: Modified independent ambulating with rollator.  2 L O2 at night (PRN during the day) ADLs Comments: Grandson assists with IADL's     Extremity/Trunk Assessment   Upper Extremity Assessment Upper Extremity Assessment: Defer to OT evaluation    Lower Extremity Assessment Lower Extremity Assessment: Generalized weakness       Communication   Communication Communication: No apparent difficulties    Cognition Arousal: Alert Behavior During Therapy: WFL for tasks assessed/performed   PT - Cognitive impairments: No apparent impairments                         Following commands: Intact       Cueing Cueing Techniques: Verbal cues     General Comments General comments (skin integrity, edema, etc.): SPo2 > 95% on 2 L/min via [place]    Exercises Other Exercises Other Exercises: Benefits of mobility to prevent deconditioning.   Assessment/Plan    PT Assessment Patient needs continued PT services  PT Problem List Decreased strength;Decreased activity tolerance;Decreased balance;Decreased mobility;Cardiopulmonary status limiting activity       PT Treatment Interventions DME instruction;Gait training;Functional mobility training;Therapeutic activities;Therapeutic exercise;Balance training;Neuromuscular re-education;Patient/family education    PT Goals (Current goals can be found in the Care Plan section)  Acute Rehab PT Goals Patient Stated Goal: to go home PT Goal Formulation: With  patient Time For Goal Achievement: 11/01/24 Potential to Achieve Goals: Good    Frequency Min 2X/week     Co-evaluation               AM-PAC PT 6 Clicks Mobility  Outcome Measure Help needed turning from your back to your side while in a flat bed without using bedrails?: A Little Help needed moving from lying on your back to sitting on the side of a flat bed without using bedrails?: A Little Help needed moving to and from a bed to a chair (including a wheelchair)?: A Little Help needed standing up from a chair using your arms (e.g., wheelchair or bedside chair)?: A Little Help needed to walk in hospital room?: A Little Help needed climbing 3-5 steps with a railing? : A Little 6 Click Score: 18    End of Session Equipment Utilized During Treatment: Gait belt;Oxygen  Activity Tolerance: Patient tolerated treatment well Patient left: in chair;with call bell/phone within reach;with chair alarm set Nurse Communication: Mobility status PT Visit Diagnosis: Muscle weakness (generalized) (M62.81);Unsteadiness on feet (R26.81)    Time: 9152-9096 PT Time Calculation (min) (ACUTE ONLY): 16 min   Charges:   PT Evaluation $PT Eval Low Complexity: 1 Low   PT General Charges $$ ACUTE PT VISIT: 1 Visit  Dorina HERO. Fairly IV, PT, DPT Physical Therapist- [place]  [facility] 10/18/2024, 10:04 AM

## 2024-10-18 NOTE — Progress Notes (Signed)
 Mobility Specialist - Progress Note    10/18/24 1400  Mobility  Activity Placed arms in swimmer's position - left arm up;Placed arms in swimmer's position - right arm up;Moved bed into chair position;Dangled on edge of bed  Level of Assistance Standby assist, set-up cues, supervision of patient - no hands on  Assistive Device None  Range of Motion/Exercises All extremities  Activity Response Tolerated fair  Mobility visit 1 Mobility  Mobility Specialist Start Time (ACUTE ONLY) 1416  Mobility Specialist Stop Time (ACUTE ONLY) 1425  Mobility Specialist Time Calculation (min) (ACUTE ONLY) 9 min   Pt was supine in bed upon entry. Pt initially agreed to mobility. Pt stated he was in extreme discomfort. Pt was able to perform extremities activity. Pt is in bed with bed alarm on and needs in reach.  Clem Rodes Mobility Specialist 10/18/24, 2:53 PM

## 2024-10-18 NOTE — Progress Notes (Addendum)
 Progress Note    Lawrence [name]  FMW:[id number] DOB: 08-May-1956  DOA: 10/16/2024 PCP: [name], MD      Brief Narrative:    Medical records reviewed and are as summarized below:  Lawrence [name] is a 68 y.o. male with medical history significant for hypertension, CAD, insomnia, diabetes mellitus type 2, hyperlipidemia, atrial fibrillation on Eliquis , chronic systolic CHF, who presented to the hospital with chest pain and shortness of breath.  He was found to have COPD exacerbation and rapid A-fib.     Assessment/Plan:   Principal Problem:   COPD exacerbation (HCC) Active Problems:   GERD without esophagitis   Controlled type 2 diabetes mellitus without complication, without long-term current use of insulin  (HCC)   Anxiety and depression   Chronic atrial fibrillation with RVR (HCC)   Coronary artery disease    Body mass index is 28.65 kg/m.   COPD exacerbation: Continue prednisone  and bronchodilators.   Permanent atrial fibrillation with RVR: Heart rate is better.  Continue metoprolol  and Eliquis .   Chronic systolic CHF: Compensated. 2D echo in September 2025 showed EF estimated at 35 to 40%.   CAD: He is on Eliquis , ranolazine , Imdur  and isosorbide  mononitrate.   Type II DM with hyperglycemia: Continue Farxiga  and NovoLog  sliding scale.   Hypokalemia: Improved.   Comorbidities include anxiety, depression, GERD   Diet Order             Diet Carb Modified Fluid consistency: Thin; Room service appropriate? Yes  Diet effective now                                  Consultants: None  Procedures: None    Medications:    allopurinol   300 mg Oral BID   apixaban   5 mg Oral BID   atorvastatin   80 mg Oral QHS   cholecalciferol   1,000 Units Oral Daily   cyanocobalamin   1,000 mcg Oral Daily   dapagliflozin  propanediol  10 mg Oral Daily   famotidine   20 mg Oral BID   guaiFENesin   600 mg Oral BID   insulin  aspart   0-20 Units Subcutaneous TID WC   ipratropium-albuterol   3 mL Nebulization BID   iron  polysaccharides  150 mg Oral Daily   isosorbide  mononitrate  120 mg Oral Daily   loratadine   10 mg Oral Daily   metoprolol  succinate  37.5 mg Oral Daily   omega-3 acid ethyl esters  1 g Oral Daily   pantoprazole   40 mg Oral BID   predniSONE   40 mg Oral Q breakfast   ranolazine   1,000 mg Oral BID   sacubitril -valsartan   1 tablet Oral BID   Continuous Infusions:  azithromycin  500 mg (10/17/24 2123)   cefTRIAXone  (ROCEPHIN )  IV Stopped (10/18/24 0457)     Anti-infectives (From admission, onward)    Start     Dose/Rate Route Frequency Ordered Stop   10/17/24 2000  azithromycin  (ZITHROMAX ) 500 mg in sodium chloride  0.9 % 250 mL IVPB        500 mg 250 mL/hr over 60 Minutes Intravenous Every 24 hours 10/17/24 1717 10/22/24 1959   10/17/24 0345  cefTRIAXone  (ROCEPHIN ) 1 g in sodium chloride  0.9 % 100 mL IVPB        1 g 200 mL/hr over 30 Minutes Intravenous Every 24 hours 10/17/24 0339 10/22/24 0344  Family Communication/Anticipated D/C date and plan/Code Status   DVT prophylaxis:  apixaban  (ELIQUIS ) tablet 5 mg     Code Status: Limited: Do not attempt resuscitation (DNR) -DNR-LIMITED -Do Not Intubate/DNI   Family Communication: None Disposition Plan: Plan to discharge home   Status is: Inpatient Remains inpatient appropriate because: COPD exacerbation       Subjective:   Interval events noted.  He complains of feel rough.  He also complains of cough and shortness of breath.  Objective:    Vitals:   10/17/24 2112 10/18/24 0538 10/18/24 0743 10/18/24 0745  BP: (!) 140/88 (!) 104/51  109/78  Pulse: 89 72  85  Resp: 20 18  14   Temp: 97.6 F (36.4 C) 98.4 F (36.9 C)  (!) 96.6 F (35.9 C)  TempSrc:    Oral  SpO2: 100% 100% 99% 99%  Weight:      Height:       No data found.   Intake/Output Summary (Last 24 hours) at 10/18/2024 1608 Last data filed at  10/18/2024 1537 Gross per 24 hour  Intake 1843.62 ml  Output 2810 ml  Net -966.38 ml   Filed Weights   10/16/24 1857 10/17/24 0517  Weight: 77 kg 83 kg    Exam:  GEN: NAD SKIN: Warm and dry EYES: No pallor or icterus ENT: MMM CV: RRR PULM: CTA B ABD: soft, ND, NT, +BS CNS: AAO x 3, non focal EXT: No edema or tenderness        Data Reviewed:   I have personally reviewed following labs and imaging studies:  Labs: Labs show the following:   Basic Metabolic Panel: Recent Labs  Lab 10/16/24 1859 10/17/24 0430 10/17/24 0735 10/18/24 1031  NA 140 143  --   --   K 3.4* 3.3*  --  4.8  CL 102 105  --   --   CO2 25 23  --   --   GLUCOSE 188* 224*  --   --   BUN 17 16  --   --   CREATININE 0.95 0.84  --   --   CALCIUM  9.3 8.7*  --   --   MG  --   --  1.9 2.1   GFR Estimated Creatinine Clearance: 84.4 mL/min (by C-G formula based on SCr of 0.84 mg/dL). Liver Function Tests: No results for input(s): AST, ALT, ALKPHOS, BILITOT, PROT, ALBUMIN in the last 168 hours. No results for input(s): LIPASE, AMYLASE in the last 168 hours. No results for input(s): AMMONIA in the last 168 hours. Coagulation profile No results for input(s): INR, PROTIME in the last 168 hours.  CBC: Recent Labs  Lab 10/16/24 1859 10/17/24 0430  WBC 10.8* 9.6  HGB 18.3* 18.0*  HCT 56.2* 53.8*  MCV 91.7 89.2  PLT 179 169   Cardiac Enzymes: No results for input(s): CKTOTAL, CKMB, CKMBINDEX, TROPONINI in the last 168 hours. BNP (last 3 results) No results for input(s): PROBNP in the last 8760 hours. CBG: Recent Labs  Lab 10/17/24 1212 10/17/24 1723 10/17/24 2143 10/18/24 0819 10/18/24 1156  GLUCAP 427* 107* 274* 188* 215*   D-Dimer: No results for input(s): DDIMER in the last 72 hours. Hgb A1c: No results for input(s): HGBA1C in the last 72 hours. Lipid Profile: No results for input(s): CHOL, HDL, LDLCALC, TRIG, CHOLHDL, LDLDIRECT  in the last 72 hours. Thyroid  function studies: No results for input(s): TSH, T4TOTAL, T3FREE, THYROIDAB in the last 72 hours.  Invalid input(s): FREET3 Anemia work  up: No results for input(s): VITAMINB12, FOLATE, FERRITIN, TIBC, IRON , RETICCTPCT in the last 72 hours. Sepsis Labs: Recent Labs  Lab 10/16/24 1859 10/17/24 0430  WBC 10.8* 9.6    Microbiology Recent Results (from the past 240 hours)  Resp panel by RT-PCR (RSV, Flu A&B, Covid) Anterior Nasal Swab     Status: None   Collection Time: 10/16/24 10:24 PM   Specimen: Anterior Nasal Swab  Result Value Ref Range Status   SARS Coronavirus 2 by RT PCR NEGATIVE NEGATIVE Final    Comment: (NOTE) SARS-CoV-2 target nucleic acids are NOT DETECTED.  The SARS-CoV-2 RNA is generally detectable in upper respiratory specimens during the acute phase of infection. The lowest concentration of SARS-CoV-2 viral copies this assay can detect is 138 copies/mL. A negative result does not preclude SARS-Cov-2 infection and should not be used as the sole basis for treatment or other patient management decisions. A negative result may occur with  improper specimen collection/handling, submission of specimen other than nasopharyngeal swab, presence of viral mutation(s) within the areas targeted by this assay, and inadequate number of viral copies(<138 copies/mL). A negative result must be combined with clinical observations, patient history, and epidemiological information. The expected result is Negative.  Fact Sheet for Patients:  bloggercourse.com  Fact Sheet for Healthcare Providers:  seriousbroker.it  This test is no t yet approved or cleared by the [place]  FDA and  has been authorized for detection and/or diagnosis of SARS-CoV-2 by FDA under an Emergency Use Authorization (EUA). This EUA will remain  in effect (meaning this test can be used) for the duration  of the COVID-19 declaration under Section 564(b)(1) of the Act, 21 U.S.C.section 360bbb-3(b)(1), unless the authorization is terminated  or revoked sooner.       Influenza A by PCR NEGATIVE NEGATIVE Final   Influenza B by PCR NEGATIVE NEGATIVE Final    Comment: (NOTE) The Xpert Xpress SARS-CoV-2/FLU/RSV plus assay is intended as an aid in the diagnosis of influenza from Nasopharyngeal swab specimens and should not be used as a sole basis for treatment. Nasal washings and aspirates are unacceptable for Xpert Xpress SARS-CoV-2/FLU/RSV testing.  Fact Sheet for Patients: bloggercourse.com  Fact Sheet for Healthcare Providers: seriousbroker.it  This test is not yet approved or cleared by the [place]  FDA and has been authorized for detection and/or diagnosis of SARS-CoV-2 by FDA under an Emergency Use Authorization (EUA). This EUA will remain in effect (meaning this test can be used) for the duration of the COVID-19 declaration under Section 564(b)(1) of the Act, 21 U.S.C. section 360bbb-3(b)(1), unless the authorization is terminated or revoked.     Resp Syncytial Virus by PCR NEGATIVE NEGATIVE Final    Comment: (NOTE) Fact Sheet for Patients: bloggercourse.com  Fact Sheet for Healthcare Providers: seriousbroker.it  This test is not yet approved or cleared by the [place]  FDA and has been authorized for detection and/or diagnosis of SARS-CoV-2 by FDA under an Emergency Use Authorization (EUA). This EUA will remain in effect (meaning this test can be used) for the duration of the COVID-19 declaration under Section 564(b)(1) of the Act, 21 U.S.C. section 360bbb-3(b)(1), unless the authorization is terminated or revoked.  Performed at [facility], [address] [place], [place] [address]     Procedures and diagnostic studies:  DG Chest 2 View Result  Date: 10/16/2024 CLINICAL DATA:  Midsternal chest pain. EXAM: CHEST - 2 VIEW COMPARISON:  October 05, 2024 FINDINGS: Multiple sternal wires and vascular clips are noted.  The heart size and mediastinal contours are within normal limits. A coronary artery stent is in place. There is marked severity calcification of the aortic arch. Both lungs are clear. The visualized skeletal structures are unremarkable. IMPRESSION: 1. Evidence of prior median sternotomy/CABG. 2. No active cardiopulmonary disease. Electronically Signed   By: Suzen Dials M.D.   On: 10/16/2024 20:19               LOS: 0 days   Mabel Unrein  [facility]   Pager on [email or web address]. If 7PM-7AM, please contact night-coverage at [email or web address]     10/18/2024, 4:08 PM

## 2024-10-18 NOTE — Progress Notes (Signed)
       CROSS COVER NOTE  NAME: Lawrence [name] MRN: [id number] DOB : May 03, 1956    Concern as stated by nurse / staff   patient was complaining of chest pain at shift change and had multiple pain medicines through out the shift. patient was given nitro at 1909 by nurse manager because patients stated it felt different than his previous pain today and felt like an elphant was sitting on his chest. Patient has a hx of COPD, Hypertention, A-fib, CHF, diabetes and CAD. Patient had a breathing treatment that said helped just a little but still in pain. He was told they were going to give him 3 nitros and then page the Doctor. I gave him a 2nd nitro at 2004 and he states that his pain is worse now. so I figured I would let you know now before I give a 3rd      Pertinent findings on chart review:   Patient Assessment    10/18/2024   10:13 PM 10/18/2024    8:37 PM 10/18/2024    7:08 PM  Vitals with BMI  Systolic 111 108 878  Diastolic 77 78 90  Pulse 84  92   Troponin <15/<15    Assessment and  Interventions   Assessment:  Chest pain, unlikely cardiac given negative troponins  Plan: Continue to monitor X X

## 2024-10-18 NOTE — Care Management Obs Status (Signed)
 MEDICARE OBSERVATION STATUS NOTIFICATION   Patient Details  Name: MAHAMADOU WELTZ MRN: [id number] Date of Birth: 09-03-1956   Medicare Observation Status Notification Given:  Yes    Remi Lopata W, CMA 10/18/2024, 12:06 PM

## 2024-10-18 NOTE — Inpatient Diabetes Management (Signed)
 Inpatient Diabetes Program Recommendations  AACE/ADA: New Consensus Statement on Inpatient Glycemic Control   Target Ranges:  Prepandial:   less than 140 mg/dL      Peak postprandial:   less than 180 mg/dL (1-2 hours)      Critically ill patients:  140 - 180 mg/dL    Latest Reference Range & Units 10/17/24 08:25 10/17/24 12:12 10/17/24 17:23 10/17/24 21:43  Glucose-Capillary 70 - 99 mg/dL 723 (H) 572 (H) 892 (H) 274 (H)   Review of Glycemic Control  Diabetes history: DM2 Outpatient Diabetes medications: Metformin  XR 500 mg QPM Current orders for Inpatient glycemic control: Novolog  0-20 units TID with meals, Farxiga  10 mg daily, Prednisone  40 mg QAM   Inpatient Diabetes Program Recommendations:    Insulin : Patient was ordered Solumedrol 40 mg Q12H which has changed to Prednisone  40 mg QAM to start today. Patient received Semglee  10 units x1 on 10/17/24.  If steroids are continued, please consider ordering Novolog  4 units TID with meals for meal coverage if patient eats at least 50% of meals.  Thanks, Earnie Gainer, RN, MSN, CDCES Diabetes Coordinator Inpatient Diabetes Program [phone] (Team Pager from 8am to 5pm)

## 2024-10-18 NOTE — Plan of Care (Signed)
  Problem: Education: Goal: Knowledge of General Education information will improve Description: Including pain rating scale, medication(s)/side effects and non-pharmacologic comfort measures Outcome: Progressing   Problem: Health Behavior/Discharge Planning: Goal: Ability to manage health-related needs will improve Outcome: Progressing   Problem: Clinical Measurements: Goal: Ability to maintain clinical measurements within normal limits will improve Outcome: Progressing Goal: Will remain free from infection Outcome: Progressing Goal: Diagnostic test results will improve Outcome: Progressing Goal: Respiratory complications will improve Outcome: Progressing Goal: Cardiovascular complication will be avoided Outcome: Progressing   Problem: Activity: Goal: Risk for activity intolerance will decrease Outcome: Progressing   Problem: Nutrition: Goal: Adequate nutrition will be maintained Outcome: Progressing   Problem: Coping: Goal: Level of anxiety will decrease Outcome: Progressing   Problem: Elimination: Goal: Will not experience complications related to bowel motility Outcome: Progressing Goal: Will not experience complications related to urinary retention Outcome: Progressing   Problem: Pain Managment: Goal: General experience of comfort will improve and/or be controlled Outcome: Progressing   Problem: Safety: Goal: Ability to remain free from injury will improve Outcome: Progressing   Problem: Skin Integrity: Goal: Risk for impaired skin integrity will decrease Outcome: Progressing   Problem: Education: Goal: Knowledge of disease or condition will improve Outcome: Progressing Goal: Knowledge of the prescribed therapeutic regimen will improve Outcome: Progressing Goal: Individualized Educational Video(s) Outcome: Progressing   Problem: Activity: Goal: Ability to tolerate increased activity will improve Outcome: Progressing Goal: Will verbalize the  importance of balancing activity with adequate rest periods Outcome: Progressing   Problem: Respiratory: Goal: Ability to maintain a clear airway will improve Outcome: Progressing Goal: Levels of oxygenation will improve Outcome: Progressing Goal: Ability to maintain adequate ventilation will improve Outcome: Progressing   Problem: Education: Goal: Ability to describe self-care measures that may prevent or decrease complications (Diabetes Survival Skills Education) will improve Outcome: Progressing Goal: Individualized Educational Video(s) Outcome: Progressing   Problem: Coping: Goal: Ability to adjust to condition or change in health will improve Outcome: Progressing   Problem: Fluid Volume: Goal: Ability to maintain a balanced intake and output will improve Outcome: Progressing   Problem: Health Behavior/Discharge Planning: Goal: Ability to identify and utilize available resources and services will improve Outcome: Progressing Goal: Ability to manage health-related needs will improve Outcome: Progressing   Problem: Metabolic: Goal: Ability to maintain appropriate glucose levels will improve Outcome: Progressing   Problem: Nutritional: Goal: Maintenance of adequate nutrition will improve Outcome: Progressing Goal: Progress toward achieving an optimal weight will improve Outcome: Progressing   Problem: Skin Integrity: Goal: Risk for impaired skin integrity will decrease Outcome: Progressing   Problem: Tissue Perfusion: Goal: Adequacy of tissue perfusion will improve Outcome: Progressing

## 2024-10-18 NOTE — Evaluation (Signed)
 Occupational Therapy Evaluation Patient Details Name: Lawrence [name] MRN: [id number] DOB: 1956-08-25 Today's Date: 10/18/2024   History of Present Illness   Lawrence [name] is a 68 y.o. Caucasian male with medical history significant for  for CAD (s/p of stent x 4/CABG 2016), A-fib on Eliquis , HFrEF (EF 35 to 40%, 07/2024), PSVT/frequent PVCs, HTN,DM, COPD on 2L O2, OSA not using CPAP, multiple prior hospitalizations related to chest pain/CHF/COPD, most recently 6 weeks ago (10/5 to 10/09/2024, [facility], who presented to the emergency room with acute onset of worsening dyspnea with associated cough productive of yellow sputum as well as wheezing without fever or chills.     Clinical Impressions Patient presenting with decreased Ind in self care,balance, functional mobility, transfers, endurance, and safety awareness.Patient  reports living at home with grandson and being Mod I with use of rollator for mobility and self care tasks. [name] is caregiver and performs all IADLs. Pt is on 2Ls via [place] at baseline at night and prn during the day at baseline. Patient endorses being very fatigued while seated in recliner chair. Pt needing min HHA to take steps from recliner back to bed. Sit >supine with min guard. Patient will benefit from acute OT to increase overall independence in the areas of ADLs, functional mobility, and safety awareness in order to safely discharge.       If plan is discharge home, recommend the following:   A little help with walking and/or transfers;A little help with bathing/dressing/bathroom;Assistance with cooking/housework;Assist for transportation     Functional Status Assessment   Patient has had a recent decline in their functional status and demonstrates the ability to make significant improvements in function in a reasonable and predictable amount of time.     Equipment Recommendations   None recommended by OT      Precautions/Restrictions    Precautions Precautions: Fall Recall of Precautions/Restrictions: Intact     Mobility Bed Mobility Overal bed mobility: Modified Independent             General bed mobility comments: used rails    Transfers Overall transfer level: Needs assistance Equipment used: Rolling walker (2 wheels) Transfers: Sit to/from Stand, Bed to chair/wheelchair/BSC Sit to Stand: Supervision     Step pivot transfers: Supervision            Balance Overall balance assessment: Needs assistance Sitting-balance support: No upper extremity supported, Feet unsupported Sitting balance-Leahy Scale: Normal     Standing balance support: Reliant on assistive device for balance                               ADL either performed or assessed with clinical judgement   ADL Overall ADL's : Needs assistance/impaired                         Toilet Transfer: Minimal assistance;Ambulation Toilet Transfer Details (indicate cue type and reason): simulated                 Vision Patient Visual Report: No change from baseline              Pertinent Vitals/Pain Pain Assessment Pain Assessment: Faces Faces Pain Scale: Hurts a little bit Pain Location: chronic back pain Pain Descriptors / Indicators: Discomfort Pain Intervention(s): Limited activity within patient's tolerance, Monitored during session     Extremity/Trunk Assessment Upper Extremity Assessment Upper Extremity Assessment: Generalized weakness  Lower Extremity Assessment Lower Extremity Assessment: Generalized weakness   Cervical / Trunk Assessment Cervical / Trunk Assessment: Kyphotic   Communication Communication Communication: No apparent difficulties   Cognition Arousal: Alert Behavior During Therapy: WFL for tasks assessed/performed Cognition: History of cognitive impairments                               Following commands: Intact       Cueing  General Comments    Cueing Techniques: Verbal cues  SPo2 > 95% on 2 L/min via [place]           Home Living Family/patient expects to be discharged to:: Private residence Living Arrangements: Other relatives (grandson) Available Help at Discharge: Family;Available 24 hours/day Type of Home: Apartment Home Access: Level entry     Home Layout: One level     Bathroom Shower/Tub: Producer, Television/film/video: Standard Bathroom Accessibility: Yes How Accessible: Accessible via walker Home Equipment: Shower seat - built in;Rollator (4 wheels);BSC/3in1;Hospital bed;Grab bars - toilet;Grab bars - tub/shower   Additional Comments: home O2. Reports needing new rollator but plans to pay out of pocket. Pt also states he needs batteries for pulse ox to check home O2      Prior Functioning/Environment Prior Level of Function : Independent/Modified Independent;History of Falls (last six months)             Mobility Comments: Modified independent ambulating with rollator.  2 L O2 at night (PRN during the day) ADLs Comments: Grandson assists with IADL's    OT Problem List: Decreased strength;Decreased activity tolerance;Impaired balance (sitting and/or standing)   OT Treatment/Interventions: Self-care/ADL training;Therapeutic exercise;Energy conservation;Therapeutic activities;Balance training;Patient/family education      OT Goals(Current goals can be found in the care plan section)   Acute Rehab OT Goals Patient Stated Goal: to go home OT Goal Formulation: With patient Time For Goal Achievement: 11/01/24 Potential to Achieve Goals: Good ADL Goals Pt Will Perform Grooming: with modified independence;standing Pt Will Perform Lower Body Dressing: with modified independence;sit to/from stand Pt Will Transfer to Toilet: with modified independence;ambulating Pt Will Perform Toileting - Clothing Manipulation and hygiene: with modified independence;sit to/from stand   OT Frequency:  Min 2X/week        AM-PAC OT 6 Clicks Daily Activity     Outcome Measure Help from another person eating meals?: None Help from another person taking care of personal grooming?: A Little Help from another person toileting, which includes using toliet, bedpan, or urinal?: A Little Help from another person bathing (including washing, rinsing, drying)?: A Little Help from another person to put on and taking off regular upper body clothing?: A Little Help from another person to put on and taking off regular lower body clothing?: A Little 6 Click Score: 19   End of Session Equipment Utilized During Treatment: Oxygen  Nurse Communication: Mobility status  Activity Tolerance: Patient tolerated treatment well Patient left: with call bell/phone within reach;in bed;with bed alarm set  OT Visit Diagnosis: Unsteadiness on feet (R26.81);Muscle weakness (generalized) (M62.81);History of falling (Z91.81)                Time: 1029-1050 OT Time Calculation (min): 21 min Charges:  OT General Charges $OT Visit: 1 Visit OT Evaluation $OT Eval Low Complexity: 1 Low OT Treatments $Self Care/Home Management : 8-22 mins  Izetta Claude, MS, OTR/L , CBIS ascom [phone]  10/18/24, 11:04 AM

## 2024-10-19 DIAGNOSIS — J441 Chronic obstructive pulmonary disease with (acute) exacerbation: Secondary | ICD-10-CM | POA: Diagnosis not present

## 2024-10-19 LAB — GLUCOSE, CAPILLARY
Glucose-Capillary: 148 mg/dL — ABNORMAL HIGH (ref 70–99)
Glucose-Capillary: 262 mg/dL — ABNORMAL HIGH (ref 70–99)
Glucose-Capillary: 166 mg/dL — ABNORMAL HIGH (ref 70–99)
Glucose-Capillary: 123 mg/dL — ABNORMAL HIGH (ref 70–99)

## 2024-10-19 LAB — TROPONIN T, HIGH SENSITIVITY: Troponin T High Sensitivity: <15 ng/L (ref 0–19)

## 2024-10-19 MED ORDER — AZITHROMYCIN 250 MG PO TABS
500.0000 mg | ORAL_TABLET | Freq: Every day | ORAL | Status: AC
  Administered 2024-10-19 – 2024-10-21 (×3): 500 mg via ORAL
  Filled 2024-10-19 (×3): qty 2

## 2024-10-19 NOTE — Progress Notes (Addendum)
 Progress Note    Lawrence [name]  FMW:[id number] DOB: 1955/11/30  DOA: 10/16/2024 PCP: [name], MD      Brief Narrative:    Medical records reviewed and are as summarized below:  Lawrence [name] is a 68 y.o. male with medical history significant for hypertension, CAD, insomnia, diabetes mellitus type 2, hyperlipidemia, atrial fibrillation on Eliquis , chronic systolic CHF, who presented to the hospital with chest pain and shortness of breath.  He was found to have COPD exacerbation and rapid A-fib.     Assessment/Plan:   Principal Problem:   COPD exacerbation (HCC) Active Problems:   GERD without esophagitis   Controlled type 2 diabetes mellitus without complication, without long-term current use of insulin  (HCC)   Anxiety and depression   Chronic atrial fibrillation with RVR (HCC)   Coronary artery disease    Body mass index is 28.65 kg/m.   COPD exacerbation: Continue antibiotics, bronchodilators and prednisone .   Permanent atrial fibrillation with RVR: Heart rate is elevated in the 110s.  Continue Eliquis  and metoprolol .   Chronic systolic CHF: Compensated. 2D echo in September 2025 showed EF estimated at 35 to 40%.   CAD, chronic angina: He complained of chest pain overnight.   Troponins x 2 were negative. He has chronic angina and has been having chest pain for several months. Continue Eliquis , Imdur , isosorbide  mononitrate and ranolazine .   Type II DM with hyperglycemia: Continue Farxiga  and NovoLog  sliding scale.   Hypokalemia: Improved.   Comorbidities include anxiety, depression, GERD   Discussed disposition plans.  He does not feel comfortable going home today.  Will plan for discharge to home tomorrow.   Diet Order             Diet Carb Modified Fluid consistency: Thin; Room service appropriate? Yes  Diet effective now                                   Consultants: None  Procedures: None    Medications:    allopurinol   300 mg Oral BID   apixaban   5 mg Oral BID   atorvastatin   80 mg Oral QHS   azithromycin   500 mg Oral Daily   cholecalciferol   1,000 Units Oral Daily   cyanocobalamin   1,000 mcg Oral Daily   dapagliflozin  propanediol  10 mg Oral Daily   famotidine   20 mg Oral BID   guaiFENesin   600 mg Oral BID   insulin  aspart  0-20 Units Subcutaneous TID WC   ipratropium-albuterol   3 mL Nebulization BID   iron  polysaccharides  150 mg Oral Daily   isosorbide  mononitrate  120 mg Oral Daily   loratadine   10 mg Oral Daily   metoprolol  succinate  37.5 mg Oral Daily   omega-3 acid ethyl esters  1 g Oral Daily   pantoprazole   40 mg Oral BID   predniSONE   40 mg Oral Q breakfast   ranolazine   1,000 mg Oral BID   sacubitril -valsartan   1 tablet Oral BID   Continuous Infusions:  cefTRIAXone  (ROCEPHIN )  IV Stopped (10/19/24 0419)     Anti-infectives (From admission, onward)    Start     Dose/Rate Route Frequency Ordered Stop   10/19/24 1000  azithromycin  (ZITHROMAX ) tablet 500 mg        500 mg Oral Daily 10/19/24 0913 10/22/24 0959   10/17/24 2000  azithromycin  (ZITHROMAX ) 500 mg in sodium  chloride 0.9 % 250 mL IVPB  Status:  Discontinued        500 mg 250 mL/hr over 60 Minutes Intravenous Every 24 hours 10/17/24 1717 10/19/24 0913   10/17/24 0345  cefTRIAXone  (ROCEPHIN ) 1 g in sodium chloride  0.9 % 100 mL IVPB        1 g 200 mL/hr over 30 Minutes Intravenous Every 24 hours 10/17/24 0339 10/22/24 0344              Family Communication/Anticipated D/C date and plan/Code Status   DVT prophylaxis:  apixaban  (ELIQUIS ) tablet 5 mg     Code Status: Limited: Do not attempt resuscitation (DNR) -DNR-LIMITED -Do Not Intubate/DNI   Family Communication: None Disposition Plan: Plan to discharge home   Status is: Inpatient Remains inpatient appropriate because: COPD  exacerbation       Subjective:   Interval events noted.  He complains of chest pain.  Breathing is better.  He has chronic chest pain from chronic angina.  Objective:    Vitals:   10/18/24 2037 10/18/24 2213 10/19/24 0418 10/19/24 0823  BP: 108/78 111/77 116/82 112/83  Pulse:  84 87 93  Resp:   17 15  Temp:   (!) 97.5 F (36.4 C) (!) 97.3 F (36.3 C)  TempSrc:    Axillary  SpO2:  100% 98% 100%  Weight:      Height:       No data found.   Intake/Output Summary (Last 24 hours) at 10/19/2024 1525 Last data filed at 10/19/2024 1416 Gross per 24 hour  Intake 1170 ml  Output 4375 ml  Net -3205 ml   Filed Weights   10/16/24 1857 10/17/24 0517  Weight: 77 kg 83 kg    Exam:  GEN: NAD SKIN: Warm and dry EYES: No pallor or icterus ENT: MMM CV: RRR PULM: No wheezing or rales heard ABD: soft, ND, NT, +BS CNS: AAO x 3, non focal EXT: No edema or tenderness         Data Reviewed:   I have personally reviewed following labs and imaging studies:  Labs: Labs show the following:   Basic Metabolic Panel: Recent Labs  Lab 10/16/24 1859 10/17/24 0430 10/17/24 0735 10/18/24 1031  NA 140 143  --   --   K 3.4* 3.3*  --  4.8  CL 102 105  --   --   CO2 25 23  --   --   GLUCOSE 188* 224*  --   --   BUN 17 16  --   --   CREATININE 0.95 0.84  --   --   CALCIUM  9.3 8.7*  --   --   MG  --   --  1.9 2.1   GFR Estimated Creatinine Clearance: 84.4 mL/min (by C-G formula based on SCr of 0.84 mg/dL). Liver Function Tests: No results for input(s): AST, ALT, ALKPHOS, BILITOT, PROT, ALBUMIN in the last 168 hours. No results for input(s): LIPASE, AMYLASE in the last 168 hours. No results for input(s): AMMONIA in the last 168 hours. Coagulation profile No results for input(s): INR, PROTIME in the last 168 hours.  CBC: Recent Labs  Lab 10/16/24 1859 10/17/24 0430  WBC 10.8* 9.6  HGB 18.3* 18.0*  HCT 56.2* 53.8*  MCV 91.7 89.2  PLT 179 169    Cardiac Enzymes: No results for input(s): CKTOTAL, CKMB, CKMBINDEX, TROPONINI in the last 168 hours. BNP (last 3 results) No results for input(s): PROBNP in the last 8760  hours. CBG: Recent Labs  Lab 10/18/24 1156 10/18/24 1617 10/18/24 2116 10/19/24 0821 10/19/24 1143  GLUCAP 215* 198* 162* 148* 262*   D-Dimer: No results for input(s): DDIMER in the last 72 hours. Hgb A1c: No results for input(s): HGBA1C in the last 72 hours. Lipid Profile: No results for input(s): CHOL, HDL, LDLCALC, TRIG, CHOLHDL, LDLDIRECT in the last 72 hours. Thyroid  function studies: No results for input(s): TSH, T4TOTAL, T3FREE, THYROIDAB in the last 72 hours.  Invalid input(s): FREET3 Anemia work up: No results for input(s): VITAMINB12, FOLATE, FERRITIN, TIBC, IRON , RETICCTPCT in the last 72 hours. Sepsis Labs: Recent Labs  Lab 10/16/24 1859 10/17/24 0430  WBC 10.8* 9.6    Microbiology Recent Results (from the past 240 hours)  Resp panel by RT-PCR (RSV, Flu A&B, Covid) Anterior Nasal Swab     Status: None   Collection Time: 10/16/24 10:24 PM   Specimen: Anterior Nasal Swab  Result Value Ref Range Status   SARS Coronavirus 2 by RT PCR NEGATIVE NEGATIVE Final    Comment: (NOTE) SARS-CoV-2 target nucleic acids are NOT DETECTED.  The SARS-CoV-2 RNA is generally detectable in upper respiratory specimens during the acute phase of infection. The lowest concentration of SARS-CoV-2 viral copies this assay can detect is 138 copies/mL. A negative result does not preclude SARS-Cov-2 infection and should not be used as the sole basis for treatment or other patient management decisions. A negative result may occur with  improper specimen collection/handling, submission of specimen other than nasopharyngeal swab, presence of viral mutation(s) within the areas targeted by this assay, and inadequate number of viral copies(<138 copies/mL). A negative  result must be combined with clinical observations, patient history, and epidemiological information. The expected result is Negative.  Fact Sheet for Patients:  bloggercourse.com  Fact Sheet for Healthcare Providers:  seriousbroker.it  This test is no t yet approved or cleared by the [place]  FDA and  has been authorized for detection and/or diagnosis of SARS-CoV-2 by FDA under an Emergency Use Authorization (EUA). This EUA will remain  in effect (meaning this test can be used) for the duration of the COVID-19 declaration under Section 564(b)(1) of the Act, 21 U.S.C.section 360bbb-3(b)(1), unless the authorization is terminated  or revoked sooner.       Influenza A by PCR NEGATIVE NEGATIVE Final   Influenza B by PCR NEGATIVE NEGATIVE Final    Comment: (NOTE) The Xpert Xpress SARS-CoV-2/FLU/RSV plus assay is intended as an aid in the diagnosis of influenza from Nasopharyngeal swab specimens and should not be used as a sole basis for treatment. Nasal washings and aspirates are unacceptable for Xpert Xpress SARS-CoV-2/FLU/RSV testing.  Fact Sheet for Patients: bloggercourse.com  Fact Sheet for Healthcare Providers: seriousbroker.it  This test is not yet approved or cleared by the [place]  FDA and has been authorized for detection and/or diagnosis of SARS-CoV-2 by FDA under an Emergency Use Authorization (EUA). This EUA will remain in effect (meaning this test can be used) for the duration of the COVID-19 declaration under Section 564(b)(1) of the Act, 21 U.S.C. section 360bbb-3(b)(1), unless the authorization is terminated or revoked.     Resp Syncytial Virus by PCR NEGATIVE NEGATIVE Final    Comment: (NOTE) Fact Sheet for Patients: bloggercourse.com  Fact Sheet for Healthcare Providers: seriousbroker.it  This  test is not yet approved or cleared by the [place]  FDA and has been authorized for detection and/or diagnosis of SARS-CoV-2 by FDA under an Emergency Use Authorization (EUA). This EUA will  remain in effect (meaning this test can be used) for the duration of the COVID-19 declaration under Section 564(b)(1) of the Act, 21 U.S.C. section 360bbb-3(b)(1), unless the authorization is terminated or revoked.  Performed at [facility], [address] [place], [place] [address]     Procedures and diagnostic studies:  No results found.              LOS: 1 day   Kennon Encinas  [facility]   Pager on [email or web address]. If 7PM-7AM, please contact night-coverage at [email or web address]     10/19/2024, 3:25 PM

## 2024-10-19 NOTE — Progress Notes (Signed)
 PHARMACIST - PHYSICIAN COMMUNICATION  CONCERNING: Antibiotic IV to Oral Route Change Policy  RECOMMENDATION: This patient is receiving azithromycin  by the intravenous route.  Based on criteria approved by the Pharmacy and Therapeutics Committee, the antibiotic(s) is/are being converted to the equivalent oral dose form(s).  DESCRIPTION: These criteria include: Patient being treated for a respiratory tract infection, urinary tract infection, cellulitis or clostridium difficile associated diarrhea if on metronidazole  The patient is not neutropenic and does not exhibit a GI malabsorption state The patient is eating (either orally or via tube) and/or has been taking other orally administered medications for a least 24 hours The patient is improving clinically and has a Tmax < 100.5  If you have questions about this conversion, please contact the Pharmacy Department  []   [phone] )  Lawrence [name] [x]   [phone] )  [facility] []   [phone] )  Lawrence [name] []   [phone] )  [facility] []   [phone] )  [facility]    Thank you for involving pharmacy in this patient's care.   Damien Napoleon, PharmD Clinical Pharmacist 10/19/2024 9:12 AM

## 2024-10-19 NOTE — Plan of Care (Signed)

## 2024-10-19 NOTE — TOC Progression Note (Signed)
 Transition of Care [facility]) - Progression Note    Patient Details  Name: Lawrence [name] MRN: [id number] Date of Birth: [birth year]  [facility] CM/SW Contact  Alfonso Rummer, LCSW Phone Number: 10/19/2024, 3:00 PM  Clinical Narrative:     Pt already active with well care. Pt lives with grandson has reports he was adequate support     Barriers to Discharge: Continued Medical Work up               Expected Discharge Plan and Services                                               Social Drivers of Health (SDOH) Interventions SDOH Screenings   Food Insecurity: Food Insecurity Present (10/17/2024)  Housing: Low Risk  (10/17/2024)  Transportation Needs: No Transportation Needs (10/17/2024)  Utilities: Not At Risk (10/17/2024)  Alcohol Screen: Low Risk  (03/14/2024)  Depression (PHQ2-9): Low Risk  (09/15/2024)  Recent Concern: Depression (PHQ2-9) - High Risk (08/11/2024)  Financial Resource Strain: Low Risk (06/19/2024)   Received from [facility]  Physical Activity: Insufficiently Active (03/14/2024)  Social Connections: Socially Isolated (10/17/2024)  Stress: No Stress Concern Present (03/14/2024)  Recent Concern: Stress - Stress Concern Present (01/13/2024)  Tobacco Use: Medium Risk (10/16/2024)  Health Literacy: Adequate Health Literacy (03/14/2024)    Readmission Risk Interventions    09/30/2023   10:28 AM 08/05/2023   10:11 AM 07/13/2023   10:52 AM  Readmission Risk Prevention Plan  Transportation Screening Complete Complete Complete  Medication Review (RN Care Manager) Complete Complete Complete  PCP or Specialist appointment within 3-5 days of discharge Complete Complete Complete  SW Recovery Care/Counseling Consult Complete Complete Complete  Palliative Care Screening Not Applicable Not Applicable Not Applicable  Skilled Nursing Facility Not Applicable Not Applicable Not Applicable

## 2024-10-19 NOTE — Progress Notes (Signed)
 PT Cancellation Note  Patient Details Name: Lawrence [name] MRN: [id number] DOB: 1956-11-09   Cancelled Treatment:    Reason Eval/Treat Not Completed: Other (comment). Pt's chart reviewed. Upon entry pt disgruntled due to nausea requesting anti-emetics. Declining PT participation due to nausea. Agreeable to re-attempt tomorrow.     Dorina HERO. Fairly IV, PT, DPT Physical Therapist- [place]  [facility] 10/19/2024, 3:18 PM

## 2024-10-20 ENCOUNTER — Inpatient Hospital Stay

## 2024-10-20 DIAGNOSIS — J441 Chronic obstructive pulmonary disease with (acute) exacerbation: Secondary | ICD-10-CM | POA: Diagnosis not present

## 2024-10-20 LAB — GLUCOSE, CAPILLARY
Glucose-Capillary: 184 mg/dL — ABNORMAL HIGH (ref 70–99)
Glucose-Capillary: 172 mg/dL — ABNORMAL HIGH (ref 70–99)
Glucose-Capillary: 190 mg/dL — ABNORMAL HIGH (ref 70–99)

## 2024-10-20 MED ORDER — ISOSORBIDE MONONITRATE ER 30 MG PO TB24
60.0000 mg | ORAL_TABLET | Freq: Every day | ORAL | Status: DC
  Administered 2024-10-21: 60 mg via ORAL

## 2024-10-20 MED ORDER — LACTATED RINGERS IV BOLUS
250.0000 mL | Freq: Once | INTRAVENOUS | Status: AC
  Administered 2024-10-20: 250 mL via INTRAVENOUS

## 2024-10-20 MED ORDER — SACUBITRIL-VALSARTAN 49-51 MG PO TABS
1.0000 | ORAL_TABLET | Freq: Two times a day (BID) | ORAL | Status: DC
  Administered 2024-10-21: 1 via ORAL
  Filled 2024-10-20: qty 1

## 2024-10-20 NOTE — Progress Notes (Signed)
 Mobility Specialist - Progress Note   10/20/24 1050  Mobility  Activity Ambulated with assistance  Level of Assistance Contact guard assist, steadying assist  Assistive Device Front wheel walker  Distance Ambulated (ft) 12 ft  Activity Response Tolerated well  Mobility visit 1 Mobility  Mobility Specialist Start Time (ACUTE ONLY) 1030  Mobility Specialist Stop Time (ACUTE ONLY) 1041  Mobility Specialist Time Calculation (min) (ACUTE ONLY) 11 min   MS responding to call bell. Pt seated in the recliner upon entry, utilizing RA. Pt required CGA to stand and amb to the bathroom--- extra time required upon standing d/t dizziness. Pt left seated on the commode with instructions to pull string when finished. RN present upon MS exit.  America Silvan Mobility Specialist 10/20/24 10:53 AM

## 2024-10-20 NOTE — Progress Notes (Signed)
 Occupational Therapy Treatment Patient Details Name: RHYDER KOEGEL MRN: [id number] DOB: 1956-09-08 Today's Date: 10/20/2024   History of present illness ESTANISLADO SURGEON is a 68 y.o. Caucasian male with medical history significant for  for CAD (s/p of stent x 4/CABG 2016), A-fib on Eliquis , HFrEF (EF 35 to 40%, 07/2024), PSVT/frequent PVCs, HTN,DM, COPD on 2L O2, OSA not using CPAP, multiple prior hospitalizations related to chest pain/CHF/COPD, most recently 6 weeks ago (10/5 to 10/09/2024, [facility], who presented to the emergency room with acute onset of worsening dyspnea with associated cough productive of yellow sputum as well as wheezing without fever or chills.   OT comments  Chart reviewed to date, pt greeted semi supine in bed, agreeable to OT tx session targeting improving functional activity tolerance in prep for ADL tasks. Pt is making progress towards goals, amb in room with RW approx 10' with CGA. See further details below. Pt does endorse dizziness with mobility changes, BP 108/83 in chair after mobility. Pt is left in chair, all needs met. OT will follow.       If plan is discharge home, recommend the following:  A little help with walking and/or transfers;A little help with bathing/dressing/bathroom;Assistance with cooking/housework;Assist for transportation   Equipment Recommendations  None recommended by OT    Recommendations for Other Services      Precautions / Restrictions Precautions Precautions: Fall Recall of Precautions/Restrictions: Intact Restrictions Weight Bearing Restrictions Per Provider Order: No       Mobility Bed Mobility Overal bed mobility: Modified Independent                  Transfers Overall transfer level: Needs assistance Equipment used: Rolling walker (2 wheels) Transfers: Sit to/from Stand Sit to Stand: Supervision                 Balance Overall balance assessment: Needs assistance Sitting-balance support: No upper  extremity supported, Feet unsupported Sitting balance-Leahy Scale: Normal     Standing balance support: Bilateral upper extremity supported, During functional activity, Reliant on assistive device for balance Standing balance-Leahy Scale: Fair                             ADL either performed or assessed with clinical judgement   ADL Overall ADL's : Needs assistance/impaired Eating/Feeding: Set up   Grooming: Set up;Sitting                   Toilet Transfer: Contact guard assist;Rolling walker (2 wheels);Ambulation Toilet Transfer Details (indicate cue type and reason): simulated         Functional mobility during ADLs: Supervision/safety;Contact guard assist;Rolling walker (2 wheels) (approx 10' in room)      Extremity/Trunk Assessment              Vision       Perception     Praxis     Communication Communication Communication: No apparent difficulties   Cognition Arousal: Alert Behavior During Therapy: WFL for tasks assessed/performed Cognition: History of cognitive impairments             OT - Cognition Comments: alert and oriented x4                 Following commands: Intact        Cueing   Cueing Techniques: Verbal cues  Exercises Other Exercises Other Exercises: edu re role of OT, role of rehab, discharge recommendations, AE/DME  for safe/easier ADLcompletion    Shoulder Instructions       General Comments spo2 >90% on 2L via [place]; pt endorses dizziness with mobility, improved with seated rest break    Pertinent Vitals/ Pain       Pain Assessment Pain Assessment: No/denies pain  Home Living                                          Prior Functioning/Environment              Frequency  Min 2X/week        Progress Toward Goals  OT Goals(current goals can now be found in the care plan section)  Progress towards OT goals: Progressing toward goals  Acute Rehab OT Goals Time For  Goal Achievement: 11/01/24  Plan      Co-evaluation                 AM-PAC OT 6 Clicks Daily Activity     Outcome Measure   Help from another person eating meals?: None Help from another person taking care of personal grooming?: A Little Help from another person toileting, which includes using toliet, bedpan, or urinal?: A Little Help from another person bathing (including washing, rinsing, drying)?: A Little Help from another person to put on and taking off regular upper body clothing?: A Little Help from another person to put on and taking off regular lower body clothing?: A Little 6 Click Score: 19    End of Session Equipment Utilized During Treatment: Oxygen ;Rolling walker (2 wheels)  OT Visit Diagnosis: Unsteadiness on feet (R26.81);Muscle weakness (generalized) (M62.81);History of falling (Z91.81)   Activity Tolerance Patient tolerated treatment well   Patient Left in chair;with call bell/phone within reach   Nurse Communication Mobility status (chair alarm out of batteries, none in supply room, notified nurse and supply chain in supply room ; pt with good adherence to fall precautions)        Time: 9144-9084 OT Time Calculation (min): 20 min  Charges: OT General Charges $OT Visit: 1 Visit OT Treatments $Therapeutic Activity: 8-22 mins  Therisa Sheffield, OTD OTR/L  10/20/24, 9:59 AM

## 2024-10-20 NOTE — Progress Notes (Addendum)
 Progress Note    AZAI GAFFIN  FMW:[id number] DOB: [birth year]  DOA: 10/16/2024 PCP: Lawrence Nancyann BRAVO, MD      Brief Narrative:    Medical records reviewed and are as summarized below:  Lawrence [name] is a 68 y.o. male with medical history significant for hypertension, CAD, insomnia, diabetes mellitus type 2, hyperlipidemia, atrial fibrillation on Eliquis , chronic systolic CHF, who presented to the hospital with chest pain and shortness of breath.  He was found to have COPD exacerbation and rapid A-fib.     Assessment/Plan:   Principal Problem:   COPD exacerbation (HCC) Active Problems:   GERD without esophagitis   Controlled type 2 diabetes mellitus without complication, without long-term current use of insulin  (HCC)   Anxiety and depression   Chronic atrial fibrillation with RVR (HCC)   Coronary artery disease    Body mass index is 28.65 kg/m.   COPD exacerbation: Continue prednisone  and bronchodilators.  Plan to complete 5 days of ceftriaxone  and azithromycin  tomorrow.   Permanent atrial fibrillation with RVR: Heart rate is better.  Continue metoprolol  and Eliquis .    Orthostatic hypotension, dizziness on standing/sitting: Sitting BP was 87/64, standing BP 104/64. He was given "Ringer's"  lactate to 250 mL bolus. Skip evening dose of Entresto .  Oxycodone  has been discontinued. Repeat orthostatic vital signs tomorrow. Reduce Imdur  from 120 mg to 60 mg daily given orthostatic hypotension   Right posterior leg pain: Venous duplex of the right right lower extremity on 10/20/2024 was negative for for DVT.   Drowsiness: This may be due to oxycodone .  Discontinue oxycodone .   Chronic systolic CHF: Compensated. 2D echo in September 2025 showed EF estimated at 35 to 40%.   CAD, chronic angina: He complained of chest pain overnight.   Troponins x 2 were negative. He has chronic angina and has been having chest pain for several months. Continue Eliquis ,  Imdur , isosorbide  mononitrate and ranolazine . It appears patient had not been taking Imdur  for a while.   Type II DM with hyperglycemia: Continue Farxiga  and NovoLog  sliding scale.   Hypokalemia: Improved.   Ringing in the ears/tinnitus: This has been going on for about 5 or 6 months.  Recommended outpatient follow-up with ENT physician.   Comorbidities include anxiety, depression, GERD       Diet Order             Diet Carb Modified Fluid consistency: Thin; Room service appropriate? Yes  Diet effective now                                  Consultants: None  Procedures: None    Medications:    allopurinol   300 mg Oral BID   apixaban   5 mg Oral BID   atorvastatin   80 mg Oral QHS   azithromycin   500 mg Oral Daily   cholecalciferol   1,000 Units Oral Daily   cyanocobalamin   1,000 mcg Oral Daily   dapagliflozin  propanediol  10 mg Oral Daily   famotidine   20 mg Oral BID   guaiFENesin   600 mg Oral BID   insulin  aspart  0-20 Units Subcutaneous TID WC   ipratropium-albuterol   3 mL Nebulization BID   iron  polysaccharides  150 mg Oral Daily   [START ON 10/21/2024] isosorbide  mononitrate  60 mg Oral Daily   loratadine   10 mg Oral Daily   metoprolol  succinate  37.5 mg Oral Daily  omega-3 acid ethyl esters  1 g Oral Daily   pantoprazole   40 mg Oral BID   predniSONE   40 mg Oral Q breakfast   ranolazine   1,000 mg Oral BID   [START ON 10/21/2024] sacubitril -valsartan   1 tablet Oral BID   Continuous Infusions:  cefTRIAXone  (ROCEPHIN )  IV 1 g (10/20/24 0444)     Anti-infectives (From admission, onward)    Start     Dose/Rate Route Frequency Ordered Stop   10/19/24 1000  azithromycin  (ZITHROMAX ) tablet 500 mg        500 mg Oral Daily 10/19/24 0913 10/22/24 0959   10/17/24 2000  azithromycin  (ZITHROMAX ) 500 mg in sodium chloride  0.9 % 250 mL IVPB  Status:  Discontinued        500 mg 250 mL/hr over 60 Minutes Intravenous Every 24 hours 10/17/24  1717 10/19/24 0913   10/17/24 0345  cefTRIAXone  (ROCEPHIN ) 1 g in sodium chloride  0.9 % 100 mL IVPB        1 g 200 mL/hr over 30 Minutes Intravenous Every 24 hours 10/17/24 0339 10/22/24 0344              Family Communication/Anticipated D/C date and plan/Code Status   DVT prophylaxis:  apixaban  (ELIQUIS ) tablet 5 mg     Code Status: Limited: Do not attempt resuscitation (DNR) -DNR-LIMITED -Do Not Intubate/DNI   Family Communication: None Disposition Plan: Plan to discharge home   Status is: Inpatient Remains inpatient appropriate because: COPD exacerbation       Subjective:   Interval events noted.  He complains of dizziness on standing and even while sitting.  He noticed this today.  He also complains of drowsiness and not feeling very well this morning.  He he complained of pain in the right calf.  He reported ringing in both ears that has been going on for about 5 or 6 months.  No report of chest pain today.   No shortness of breath.   Objective:    Vitals:   10/20/24 0840 10/20/24 0953 10/20/24 1045 10/20/24 1048  BP: 110/79 108/83 (!) 87/64 104/64  Pulse:  92 (!) 59 74  Resp:      Temp: 98.1 F (36.7 C)     TempSrc: Oral     SpO2: 97% 98%    Weight:      Height:       No data found.   Intake/Output Summary (Last 24 hours) at 10/20/2024 1503 Last data filed at 10/20/2024 0900 Gross per 24 hour  Intake 720 ml  Output 3150 ml  Net -2430 ml   Filed Weights   10/16/24 1857 10/17/24 0517  Weight: 77 kg 83 kg    Exam:  GEN: NAD SKIN: Warm and dry EYES: No pallor or icterus ENT: MMM CV: RRR PULM: CTA B ABD: soft, ND, NT, +BS CNS: AAO x 3, non focal EXT: Mild right calf tenderness without swelling or erythema in the lower extremities          Data Reviewed:   I have personally reviewed following labs and imaging studies:  Labs: Labs show the following:   Basic Metabolic Panel: Recent Labs  Lab 10/16/24 1859 10/17/24 0430  10/17/24 0735 10/18/24 1031  NA 140 143  --   --   K 3.4* 3.3*  --  4.8  CL 102 105  --   --   CO2 25 23  --   --   GLUCOSE 188* 224*  --   --  BUN 17 16  --   --   CREATININE 0.95 0.84  --   --   CALCIUM  9.3 8.7*  --   --   MG  --   --  1.9 2.1   GFR Estimated Creatinine Clearance: 84.4 mL/min (by C-G formula based on SCr of 0.84 mg/dL). Liver Function Tests: No results for input(s): AST, ALT, ALKPHOS, BILITOT, PROT, ALBUMIN in the last 168 hours. No results for input(s): LIPASE, AMYLASE in the last 168 hours. No results for input(s): AMMONIA in the last 168 hours. Coagulation profile No results for input(s): INR, PROTIME in the last 168 hours.  CBC: Recent Labs  Lab 10/16/24 1859 10/17/24 0430  WBC 10.8* 9.6  HGB 18.3* 18.0*  HCT 56.2* 53.8*  MCV 91.7 89.2  PLT 179 169   Cardiac Enzymes: No results for input(s): CKTOTAL, CKMB, CKMBINDEX, TROPONINI in the last 168 hours. BNP (last 3 results) No results for input(s): PROBNP in the last 8760 hours. CBG: Recent Labs  Lab 10/19/24 0821 10/19/24 1143 10/19/24 1640 10/19/24 2057 10/20/24 0837  GLUCAP 148* 262* 166* 123* 184*   D-Dimer: No results for input(s): DDIMER in the last 72 hours. Hgb A1c: No results for input(s): HGBA1C in the last 72 hours. Lipid Profile: No results for input(s): CHOL, HDL, LDLCALC, TRIG, CHOLHDL, LDLDIRECT in the last 72 hours. Thyroid  function studies: No results for input(s): TSH, T4TOTAL, T3FREE, THYROIDAB in the last 72 hours.  Invalid input(s): FREET3 Anemia work up: No results for input(s): VITAMINB12, FOLATE, FERRITIN, TIBC, IRON , RETICCTPCT in the last 72 hours. Sepsis Labs: Recent Labs  Lab 10/16/24 1859 10/17/24 0430  WBC 10.8* 9.6    Microbiology Recent Results (from the past 240 hours)  Resp panel by RT-PCR (RSV, Flu A&B, Covid) Anterior Nasal Swab     Status: None   Collection Time: 10/16/24  10:24 PM   Specimen: Anterior Nasal Swab  Result Value Ref Range Status   SARS Coronavirus 2 by RT PCR NEGATIVE NEGATIVE Final    Comment: (NOTE) SARS-CoV-2 target nucleic acids are NOT DETECTED.  The SARS-CoV-2 RNA is generally detectable in upper respiratory specimens during the acute phase of infection. The lowest concentration of SARS-CoV-2 viral copies this assay can detect is 138 copies/mL. A negative result does not preclude SARS-Cov-2 infection and should not be used as the sole basis for treatment or other patient management decisions. A negative result may occur with  improper specimen collection/handling, submission of specimen other than nasopharyngeal swab, presence of viral mutation(s) within the areas targeted by this assay, and inadequate number of viral copies(<138 copies/mL). A negative result must be combined with clinical observations, patient history, and epidemiological information. The expected result is Negative.  Fact Sheet for Patients:  bloggercourse.com  Fact Sheet for Healthcare Providers:  seriousbroker.it  This test is no t yet approved or cleared by the [place]  FDA and  has been authorized for detection and/or diagnosis of SARS-CoV-2 by FDA under an Emergency Use Authorization (EUA). This EUA will remain  in effect (meaning this test can be used) for the duration of the COVID-19 declaration under Section 564(b)(1) of the Act, 21 U.S.C.section 360bbb-3(b)(1), unless the authorization is terminated  or revoked sooner.       Influenza A by PCR NEGATIVE NEGATIVE Final   Influenza B by PCR NEGATIVE NEGATIVE Final    Comment: (NOTE) The Xpert Xpress SARS-CoV-2/FLU/RSV plus assay is intended as an aid in the diagnosis of influenza from Nasopharyngeal swab specimens  and should not be used as a sole basis for treatment. Nasal washings and aspirates are unacceptable for Xpert Xpress  SARS-CoV-2/FLU/RSV testing.  Fact Sheet for Patients: bloggercourse.com  Fact Sheet for Healthcare Providers: seriousbroker.it  This test is not yet approved or cleared by the [place]  FDA and has been authorized for detection and/or diagnosis of SARS-CoV-2 by FDA under an Emergency Use Authorization (EUA). This EUA will remain in effect (meaning this test can be used) for the duration of the COVID-19 declaration under Section 564(b)(1) of the Act, 21 U.S.C. section 360bbb-3(b)(1), unless the authorization is terminated or revoked.     Resp Syncytial Virus by PCR NEGATIVE NEGATIVE Final    Comment: (NOTE) Fact Sheet for Patients: bloggercourse.com  Fact Sheet for Healthcare Providers: seriousbroker.it  This test is not yet approved or cleared by the [place]  FDA and has been authorized for detection and/or diagnosis of SARS-CoV-2 by FDA under an Emergency Use Authorization (EUA). This EUA will remain in effect (meaning this test can be used) for the duration of the COVID-19 declaration under Section 564(b)(1) of the Act, 21 U.S.C. section 360bbb-3(b)(1), unless the authorization is terminated or revoked.  Performed at [facility], [address] [place], [place] [address]     Procedures and diagnostic studies:  US  Venous Img Lower Unilateral Right (DVT) Result Date: 10/20/2024 CLINICAL DATA:  Pain in the right lower extremity EXAM: RIGHT LOWER EXTREMITY VENOUS DOPPLER ULTRASOUND TECHNIQUE: Gray-scale sonography with compression, as well as color and duplex ultrasound, were performed to evaluate the deep venous system(s) from the level of the common femoral vein through the popliteal and proximal calf veins. COMPARISON:  None Available. FINDINGS: VENOUS Normal compressibility of the common femoral, superficial femoral, and popliteal veins, as well as the  visualized calf veins. Visualized portions of profunda femoral vein and great saphenous vein unremarkable. No filling defects to suggest DVT on grayscale or color Doppler imaging. Doppler waveforms show normal direction of venous flow, normal respiratory plasticity and response to augmentation. Limited views of the contralateral common femoral vein are unremarkable. OTHER None. Limitations: none IMPRESSION: Negative. Electronically Signed   By: Wilkie Lent M.D.   On: 10/20/2024 13:39                LOS: 2 days   Monie Shere  [facility]   Pager on [email or web address]. If 7PM-7AM, please contact night-coverage at [email or web address]     10/20/2024, 3:03 PM

## 2024-10-20 NOTE — Care Management Important Message (Signed)
 Important Message  Patient Details  Name: Lawrence [name] MRN: [id number] Date of Birth: 1956-08-22   Important Message Given:  Yes - Medicare IM     Siraj Dermody W, CMA 10/20/2024, 12:36 PM

## 2024-10-20 NOTE — Progress Notes (Signed)
 PT Cancellation Note  Patient Details Name: Lawrence [name] MRN: [id number] DOB: 01/20/1956   Cancelled Treatment:    Reason Eval/Treat Not Completed: Medical issues which prohibited therapy. Per pt, pending RLE ultrasound for possible DVT. Holding PT until ultrasound complete.    Dorina HERO. Fairly IV, PT, DPT Physical Therapist- [place]  [facility] 10/20/2024, 11:14 AM

## 2024-10-21 DIAGNOSIS — J441 Chronic obstructive pulmonary disease with (acute) exacerbation: Secondary | ICD-10-CM | POA: Diagnosis not present

## 2024-10-21 LAB — GLUCOSE, CAPILLARY: Glucose-Capillary: 146 mg/dL — ABNORMAL HIGH (ref 70–99)

## 2024-10-21 NOTE — Plan of Care (Signed)

## 2024-10-21 NOTE — Discharge Summary (Signed)
 Physician Discharge Summary   Patient: Lawrence [name] MRN: [id number] DOB: 06/17/1956  Admit date:     10/16/2024  Discharge date: 10/21/24  Discharge Physician: AIDA CHO   PCP: [name], MD   Recommendations at discharge:   Follow-up with PCP in 1 week  Discharge Diagnoses: Principal Problem:   COPD exacerbation (HCC) Active Problems:   GERD without esophagitis   Controlled type 2 diabetes mellitus without complication, without long-term current use of insulin  (HCC)   Anxiety and depression   Chronic atrial fibrillation with RVR (HCC)   Coronary artery disease  Resolved Problems:   * No resolved hospital problems. *  Hospital Course:  [name] is a 68 y.o. male with medical history significant for hypertension, CAD, insomnia, diabetes mellitus type 2, hyperlipidemia, atrial fibrillation on Eliquis , chronic systolic CHF, who presented to the hospital with chest pain and shortness of breath.   He was found to have COPD exacerbation and rapid A-fib.   Assessment and Plan:   COPD exacerbation: Completed 5 days of ceftriaxone , azithromycin  and steroids.     Permanent atrial fibrillation with RVR: Heart rate is better.  Continue metoprolol  and Eliquis .      Orthostatic hypotension, dizziness on standing/sitting: Improved.   Oral BP 142/86, sitting BP 148/95 and another sitting BP 132/82. Patient says he no longer takes Imdur  at home.  He said he was taken off of Imdur  by his physician.  Imdur  will be discontinued at discharge. He was unable to give me the reason why Imdur  was discontinued.     Right posterior leg pain: Improved.  Venous duplex of the right right lower extremity on 10/20/2024 was negative for for DVT.     Drowsiness: Resolved. He takes xanax  and oxycodone  at home.  He was cautioned about using both Xanax  and oxycodone  because of increased risk for drowsiness, dizziness, falls and respiratory failure.  He said oxycodone  as prescribed by  his pain medicine doctor and he prefers to continue these medicines for now.    Chronic systolic CHF: Compensated. 2D echo in September 2025 showed EF estimated at 35 to 40%.     CAD, chronic angina: Chest pain improved. Troponins x 2 were negative. He has chronic angina and has been having chest pain for several months. Continue Eliquis , ranolazine  He said he no longer takes Imdur     Type II DM with hyperglycemia: Continue Farxiga  and metformin .     Hypokalemia: Improved.     Ringing in the ears/tinnitus: This has been going on for about 5 or 6 months.  Recommended outpatient follow-up with ENT physician.     Comorbidities include anxiety, depression, GERD       His condition has improved and he is deemed stable for discharge to home today.           Consultants: None Procedures performed: None Disposition: Home Diet recommendation:  Discharge Diet Orders (From admission, onward)     Start     Ordered   10/21/24 0000  Diet - low sodium heart healthy        10/21/24 0926   10/21/24 0000  Diet Carb Modified        10/21/24 0926           Cardiac and Carb modified diet DISCHARGE MEDICATION: Allergies as of 10/21/2024       Reactions   Demerol  [meperidine Hcl]    Demerol [meperidine] Hives   Morphine     Pill form not tolerated.  Can have fluid form   Sulfa Antibiotics Hives   Albuterol  Other (See Comments), Palpitations   Pt currently uses this medication.   Empagliflozin  Other (See Comments), Hives   Perineal pain Other Reaction(s): Other (See Comments)   Morphine  Sulfate Nausea And Vomiting, Rash, Other (See Comments)   Pt states that he is only allergic to the tablet form of this medication.          Medication List     STOP taking these medications    isosorbide  mononitrate 120 MG 24 hr tablet Commonly known as: IMDUR    ondansetron  4 MG tablet Commonly known as: Zofran        TAKE these medications    Accu-Chek Guide test  strip Generic drug: glucose blood Use as instructed to check sugar daily for type 2 diabetes.   Accu-Chek Guide w/Device Kit Use to check blood sugars as directed   Accu-Chek Softclix Lancets lancets Use as instructed to check sugar daily for type 2 diabetes.   acetaminophen  325 MG tablet Commonly known as: TYLENOL  Take 2 tablets (650 mg total) by mouth every 6 (six) hours as needed for mild pain (pain score 1-3) or fever (or Fever >/= 101).   albuterol  (2.5 MG/3ML) 0.083% nebulizer solution Commonly known as: PROVENTIL  Take 3 mLs (2.5 mg total) by nebulization every 6 (six) hours as needed for wheezing or shortness of breath.   allopurinol  300 MG tablet Commonly known as: ZYLOPRIM  TAKE 1 TABLET BY MOUTH TWICE A DAY   ALPRAZolam  1 MG tablet Commonly known as: XANAX  TAKE 1 TABLET BY MOUTH 3 TIMES A DAY AS NEEDED   apixaban  5 MG Tabs tablet Commonly known as: Eliquis  Take 1 tablet (5 mg total) by mouth 2 (two) times daily.   atorvastatin  80 MG tablet Commonly known as: LIPITOR  TAKE 1 TABLET BY MOUTH AT BEDTIME   Blood Pressure Monitor Devi Use to check blood pressure daily   Breztri  Aerosphere 160-9-4.8 MCG/ACT Aero inhaler Generic drug: budesonide -glycopyrrolate -formoterol  INHALE 2 PUFFS BY MOUTH INTO THE LUNGS 2TIMES DAILY   cetirizine  10 MG tablet Commonly known as: ZYRTEC  Take 1 tablet (10 mg total) by mouth at bedtime.   cholecalciferol  25 MCG (1000 UNIT) tablet Commonly known as: VITAMIN D3 Take 1,000 Units by mouth daily.   famotidine  20 MG tablet Commonly known as: PEPCID  TAKE 1 TABLET BY MOUTH TWICE A DAY   Farxiga  10 MG Tabs tablet Generic drug: dapagliflozin  propanediol TAKE 1 TABLET BY MOUTH DAILY   iron  polysaccharides 150 MG capsule Commonly known as: Ferrex 150 Take one capsul every Monday, Wednesday, and Friday   metFORMIN  500 MG 24 hr tablet Commonly known as: GLUCOPHAGE -XR TAKE 1 TABLET BY MOUTH DAILY WITH EVENING MEAL   methocarbamol   500 MG tablet Commonly known as: ROBAXIN  TAKE 1 TABLET BY MOUTH EVERY 8 HOURS AS NEEDED FOR MUSCLE SPASMS   metoprolol  succinate 25 MG 24 hr tablet Commonly known as: TOPROL -XL Take 1.5 tablets (37.5 mg total) by mouth daily.   nitroGLYCERIN  0.4 MG SL tablet Commonly known as: NITROSTAT  Place 1 tablet (0.4 mg total) under the tongue every 5 (five) minutes as needed for chest pain (Do not take if blood pressure is low, systolic BP<110).   omega-3 acid ethyl esters 1 g capsule Commonly known as: LOVAZA  TAKE 4 CAPSULES (4 GRAMS TOTAL) BY MOUTHDAILY   oxyCODONE -acetaminophen  10-325 MG tablet Commonly known as: PERCOCET TAKE 1 TABLET BY MOUTH EVERY 4 HOURS AS NEEDED FOR PAIN   OXYGEN  Inhale 2 L  into the lungs at bedtime as needed (for shortness of breath).   pantoprazole  40 MG tablet Commonly known as: PROTONIX  TAKE 1 TABLET BY MOUTH 2 TIMES DAILY (30MINUTES BEFORE A MEAL)   ranolazine  1000 MG SR tablet Commonly known as: RANEXA  Take 1 tablet (1,000 mg total) by mouth 2 (two) times daily.   sacubitril -valsartan  49-51 MG Commonly known as: Entresto  Take 1 tablet by mouth 2 (two) times daily.   traZODone  150 MG tablet Commonly known as: DESYREL  TAKE 1 TABLET BY MOUTH AT BEDTIME AS NEEDED FOR SLEEP.   Vitamin B-12 1000 MCG Subl Place 1 tablet under the tongue daily.        Follow-up Information     [facility]. Schedule an appointment as soon as possible for a visit in 1 week(s).   Contact information: [address] [place]  [address] [phone]               Discharge Exam: Fredricka Weights   10/16/24 1857 10/17/24 0517  Weight: 77 kg 83 kg   GEN: NAD SKIN: Warm and dry EYES: No pallor or icterus ENT: MMM CV: RRR PULM: Air entry adequate bilaterally.  Mild bilateral rhonchi ABD: soft, ND, NT, +BS CNS: AAO x 3, non focal EXT: No edema or tenderness   Condition at discharge: good  The results of  significant diagnostics from this hospitalization (including imaging, microbiology, ancillary and laboratory) are listed below for reference.   Imaging Studies: US  Venous Img Lower Unilateral Right (DVT) Result Date: 10/20/2024 CLINICAL DATA:  Pain in the right lower extremity EXAM: RIGHT LOWER EXTREMITY VENOUS DOPPLER ULTRASOUND TECHNIQUE: Gray-scale sonography with compression, as well as color and duplex ultrasound, were performed to evaluate the deep venous system(s) from the level of the common femoral vein through the popliteal and proximal calf veins. COMPARISON:  None Available. FINDINGS: VENOUS Normal compressibility of the common femoral, superficial femoral, and popliteal veins, as well as the visualized calf veins. Visualized portions of profunda femoral vein and great saphenous vein unremarkable. No filling defects to suggest DVT on grayscale or color Doppler imaging. Doppler waveforms show normal direction of venous flow, normal respiratory plasticity and response to augmentation. Limited views of the contralateral common femoral vein are unremarkable. OTHER None. Limitations: none IMPRESSION: Negative. Electronically Signed   By: Wilkie Lent M.D.   On: 10/20/2024 13:39   DG Chest 2 View Result Date: 10/16/2024 CLINICAL DATA:  Midsternal chest pain. EXAM: CHEST - 2 VIEW COMPARISON:  October 05, 2024 FINDINGS: Multiple sternal wires and vascular clips are noted. The heart size and mediastinal contours are within normal limits. A coronary artery stent is in place. There is marked severity calcification of the aortic arch. Both lungs are clear. The visualized skeletal structures are unremarkable. IMPRESSION: 1. Evidence of prior median sternotomy/CABG. 2. No active cardiopulmonary disease. Electronically Signed   By: Suzen Dials M.D.   On: 10/16/2024 20:19   DG Chest 2 View Result Date: 10/05/2024 CLINICAL DATA:  Chest pain and weakness. EXAM: CHEST - 2 VIEW COMPARISON:  September 09, 2024 FINDINGS: Multiple sternal wires and vascular clips are seen. The heart size and mediastinal contours are within normal limits. A coronary artery stent is in place. There is marked severity calcification of the aortic arch. Both lungs are clear. The visualized skeletal structures are unremarkable. IMPRESSION: 1. Evidence of prior median sternotomy/CABG. 2. No active cardiopulmonary disease. Electronically Signed   By: Suzen Dials HERO.D.  On: 10/05/2024 18:32    Microbiology: Results for orders placed or performed during the hospital encounter of 10/16/24  Resp panel by RT-PCR (RSV, Flu A&B, Covid) Anterior Nasal Swab     Status: None   Collection Time: 10/16/24 10:24 PM   Specimen: Anterior Nasal Swab  Result Value Ref Range Status   SARS Coronavirus 2 by RT PCR NEGATIVE NEGATIVE Final    Comment: (NOTE) SARS-CoV-2 target nucleic acids are NOT DETECTED.  The SARS-CoV-2 RNA is generally detectable in upper respiratory specimens during the acute phase of infection. The lowest concentration of SARS-CoV-2 viral copies this assay can detect is 138 copies/mL. A negative result does not preclude SARS-Cov-2 infection and should not be used as the sole basis for treatment or other patient management decisions. A negative result may occur with  improper specimen collection/handling, submission of specimen other than nasopharyngeal swab, presence of viral mutation(s) within the areas targeted by this assay, and inadequate number of viral copies(<138 copies/mL). A negative result must be combined with clinical observations, patient history, and epidemiological information. The expected result is Negative.  Fact Sheet for Patients:  bloggercourse.com  Fact Sheet for Healthcare Providers:  seriousbroker.it  This test is no t yet approved or cleared by the [place]  FDA and  has been authorized for detection and/or diagnosis of  SARS-CoV-2 by FDA under an Emergency Use Authorization (EUA). This EUA will remain  in effect (meaning this test can be used) for the duration of the COVID-19 declaration under Section 564(b)(1) of the Act, 21 U.S.C.section 360bbb-3(b)(1), unless the authorization is terminated  or revoked sooner.       Influenza A by PCR NEGATIVE NEGATIVE Final   Influenza B by PCR NEGATIVE NEGATIVE Final    Comment: (NOTE) The Xpert Xpress SARS-CoV-2/FLU/RSV plus assay is intended as an aid in the diagnosis of influenza from Nasopharyngeal swab specimens and should not be used as a sole basis for treatment. Nasal washings and aspirates are unacceptable for Xpert Xpress SARS-CoV-2/FLU/RSV testing.  Fact Sheet for Patients: bloggercourse.com  Fact Sheet for Healthcare Providers: seriousbroker.it  This test is not yet approved or cleared by the [place]  FDA and has been authorized for detection and/or diagnosis of SARS-CoV-2 by FDA under an Emergency Use Authorization (EUA). This EUA will remain in effect (meaning this test can be used) for the duration of the COVID-19 declaration under Section 564(b)(1) of the Act, 21 U.S.C. section 360bbb-3(b)(1), unless the authorization is terminated or revoked.     Resp Syncytial Virus by PCR NEGATIVE NEGATIVE Final    Comment: (NOTE) Fact Sheet for Patients: bloggercourse.com  Fact Sheet for Healthcare Providers: seriousbroker.it  This test is not yet approved or cleared by the [place]  FDA and has been authorized for detection and/or diagnosis of SARS-CoV-2 by FDA under an Emergency Use Authorization (EUA). This EUA will remain in effect (meaning this test can be used) for the duration of the COVID-19 declaration under Section 564(b)(1) of the Act, 21 U.S.C. section 360bbb-3(b)(1), unless the authorization is terminated  or revoked.  Performed at [facility], [address] [place], [place] [address]    *Note: Due to a large number of results and/or encounters for the requested time period, some results have not been displayed. A complete set of results can be found in Results Review.    Labs: CBC: Recent Labs  Lab 10/16/24 1859 10/17/24 0430  WBC 10.8* 9.6  HGB 18.3* 18.0*  HCT 56.2* 53.8*  MCV 91.7  89.2  PLT 179 169   Basic Metabolic Panel: Recent Labs  Lab 10/16/24 1859 10/17/24 0430 10/17/24 0735 10/18/24 1031  NA 140 143  --   --   K 3.4* 3.3*  --  4.8  CL 102 105  --   --   CO2 25 23  --   --   GLUCOSE 188* 224*  --   --   BUN 17 16  --   --   CREATININE 0.95 0.84  --   --   CALCIUM  9.3 8.7*  --   --   MG  --   --  1.9 2.1   Liver Function Tests: No results for input(s): AST, ALT, ALKPHOS, BILITOT, PROT, ALBUMIN in the last 168 hours. CBG: Recent Labs  Lab 10/19/24 2057 10/20/24 0837 10/20/24 1603 10/20/24 2046 10/21/24 0820  GLUCAP 123* 184* 172* 190* 146*    Discharge time spent: greater than 30 minutes.  Signed: AIDA CHO, MD [facility] 10/21/2024

## 2024-10-21 NOTE — Progress Notes (Signed)
 Occupational Therapy Treatment Patient Details Name: Lawrence [name] MRN: [id number] DOB: 1956/08/11 Today's Date: 10/21/2024   History of present illness Lawrence [name] is a 68 y.o. Caucasian male with medical history significant for  for CAD (s/p of stent x 4/CABG 2016), A-fib on Eliquis , HFrEF (EF 35 to 40%, 07/2024), PSVT/frequent PVCs, HTN,DM, COPD on 2L O2, OSA not using CPAP, multiple prior hospitalizations related to chest pain/CHF/COPD, most recently 6 weeks ago (10/5 to 10/09/2024, [facility], who presented to the emergency room with acute onset of worsening dyspnea with associated cough productive of yellow sputum as well as wheezing without fever or chills.   OT comments  Chart reviewed to date, pt greeted sitting on edge of bed on phone with grandson who is ordering his uber to be picked up. Pt amb in room 10' with RW with supervision, supervision for LB dressing. Hand off to nurse who is getting mwc to transport patient to ride. Pt will benefit from skilled OT to address deficits and to facilitate optimal ADL/functional mobility engagement.       If plan is discharge home, recommend the following:  A little help with walking and/or transfers;A little help with bathing/dressing/bathroom;Assistance with cooking/housework;Assist for transportation   Equipment Recommendations  None recommended by OT    Recommendations for Other Services      Precautions / Restrictions Precautions Precautions: Fall Recall of Precautions/Restrictions: Intact Restrictions Weight Bearing Restrictions Per Provider Order: No       Mobility Bed Mobility               General bed mobility comments: NT sitting on edge of bed pre/post session    Transfers Overall transfer level: Needs assistance Equipment used: Rolling walker (2 wheels) Transfers: Sit to/from Stand Sit to Stand: Supervision                 Balance Overall balance assessment: Needs assistance Sitting-balance support:  No upper extremity supported, Feet unsupported Sitting balance-Leahy Scale: Normal     Standing balance support: Bilateral upper extremity supported, During functional activity, Reliant on assistive device for balance Standing balance-Leahy Scale: Fair                             ADL either performed or assessed with clinical judgement   ADL Overall ADL's : Needs assistance/impaired                     Lower Body Dressing: Supervision/safety Lower Body Dressing Details (indicate cue type and reason): donn shoes             Functional mobility during ADLs: Supervision/safety;Rolling walker (2 wheels) (approx 10' in room)      Extremity/Trunk Assessment              Vision       Perception     Praxis     Communication Communication Communication: No apparent difficulties   Cognition Arousal: Alert Behavior During Therapy: WFL for tasks assessed/performed Cognition: History of cognitive impairments             OT - Cognition Comments: alert and oriented x4                 Following commands: Intact        Cueing   Cueing Techniques: Verbal cues  Exercises      Shoulder Instructions       General Comments  Pertinent Vitals/ Pain       Pain Assessment Pain Assessment: No/denies pain  Home Living                                          Prior Functioning/Environment              Frequency  Min 2X/week        Progress Toward Goals  OT Goals(current goals can now be found in the care plan section)  Progress towards OT goals: Progressing toward goals  Acute Rehab OT Goals Time For Goal Achievement: 11/01/24  Plan      Co-evaluation                 AM-PAC OT 6 Clicks Daily Activity     Outcome Measure   Help from another person eating meals?: None Help from another person taking care of personal grooming?: A Little Help from another person toileting, which includes  using toliet, bedpan, or urinal?: A Little Help from another person bathing (including washing, rinsing, drying)?: A Little Help from another person to put on and taking off regular upper body clothing?: A Little Help from another person to put on and taking off regular lower body clothing?: A Little 6 Click Score: 19    End of Session Equipment Utilized During Treatment: Oxygen ;Rolling walker (2 wheels)  OT Visit Diagnosis: Unsteadiness on feet (R26.81);Muscle weakness (generalized) (M62.81);History of falling (Z91.81)   Activity Tolerance Patient tolerated treatment well   Patient Left  (sitting on edge of bed)   Nurse Communication Mobility status        Time: 1056-1105 OT Time Calculation (min): 9 min  Charges: OT General Charges $OT Visit: 1 Visit OT Treatments $Therapeutic Activity: 8-22 mins  {Lawrence [name], OTD OTR/L  10/21/24, 12:04 PM

## 2024-10-23 ENCOUNTER — Telehealth: Payer: Self-pay

## 2024-10-23 ENCOUNTER — Other Ambulatory Visit: Payer: Self-pay | Admitting: Family Medicine

## 2024-10-23 DIAGNOSIS — J441 Chronic obstructive pulmonary disease with (acute) exacerbation: Secondary | ICD-10-CM

## 2024-10-23 DIAGNOSIS — F418 Other specified anxiety disorders: Secondary | ICD-10-CM

## 2024-10-23 DIAGNOSIS — I5043 Acute on chronic combined systolic (congestive) and diastolic (congestive) heart failure: Secondary | ICD-10-CM

## 2024-10-23 DIAGNOSIS — J9611 Chronic respiratory failure with hypoxia: Secondary | ICD-10-CM

## 2024-10-23 NOTE — Transitions of Care (Post Inpatient/ED Visit) (Signed)
 10/23/2024  Name: Lawrence [name] MRN: [id number] DOB: 10-28-1956  Today's TOC FU Call Status: Today's TOC FU Call Status:: Successful TOC FU Call Completed TOC FU Call Complete Date: 10/23/24  Patient's Name and Date of Birth confirmed. Name, DOB  Transition Care Management Follow-up Telephone Call Date of Discharge: 10/21/24 Discharge Facility: [facility]) Type of Discharge: Inpatient Admission Primary Inpatient Discharge Diagnosis:: COPD exacerbation How have you been since you were released from the hospital?: Better Any questions or concerns?: No  Items Reviewed: Did you receive and understand the discharge instructions provided?: Yes Medications obtained,verified, and reconciled?:  (Medications reviewed with grandson, Terry Bolotin with patient's permission) Any new allergies since your discharge?: No Dietary orders reviewed?: Yes Type of Diet Ordered:: low sodium heart healthy, modified diabetic Do you have support at home?: Yes People in Home [RPT]: grandchild(ren) Name of Support/Comfort Primary Source: [name] grandson  Medications Reviewed Today: Medications Reviewed Today     Reviewed by Eilleen Richerd GRADE, RN (Registered Nurse) on 10/23/24 at 1219  Med List Status: <None>   Medication Order Taking? Sig Documenting Provider Last Dose Status Informant  Accu-Chek Softclix Lancets lancets [id number]  Use as instructed to check sugar daily for type 2 diabetes. [name], MD  Active Self, Other, Pharmacy Records  acetaminophen  (TYLENOL ) 325 MG tablet [id number]  Take 2 tablets (650 mg total) by mouth every 6 (six) hours as needed for mild pain (pain score 1-3) or fever (or Fever >/= 101). Dorinda Drue DASEN, MD  Active Self, Other, Pharmacy Records           Med Note [name], RICHERD GRADE Kitchens Oct 23, 2024  9:32 AM) Needs to pick some up  albuterol  (PROVENTIL ) (2.5 MG/3ML) 0.083% nebulizer solution [id number]  Take 3 mLs (2.5 mg total) by  nebulization every 6 (six) hours as needed for wheezing or shortness of breath. [name], MD  Active Self, Other, Pharmacy Records           Med Note [facility], [facility] A   Wed Oct 18, 2024  2:57 PM) prn  allopurinol  (ZYLOPRIM ) 300 MG tablet [id number] No TAKE 1 TABLET BY MOUTH TWICE A DAY [name], MD Past Week Active Self, Other, Pharmacy Records  ALPRAZolam  (XANAX ) 1 MG tablet [id number]  TAKE 1 TABLET BY MOUTH 3 TIMES A DAY AS NEEDED [name], MD  Active Self, Other, Pharmacy Records           Med Note [facility], [place] A   Wed Oct 18, 2024  2:57 PM) prn  apixaban  (ELIQUIS ) 5 MG TABS tablet [id number] No Take 1 tablet (5 mg total) by mouth 2 (two) times daily. [name], MD Past Week Active Self, Other, Pharmacy Records  atorvastatin  (LIPITOR ) 80 MG tablet [id number] No TAKE 1 TABLET BY MOUTH AT BEDTIME [name], Nancyann BRAVO, MD Past Month Active Self, Other, Pharmacy Records  Blood Glucose Monitoring Suppl (ACCU-CHEK GUIDE) w/Device KIT [id number]  Use to check blood sugars as directed [name], MD  Active Self, Other, Pharmacy Records  Blood Pressure Monitor DEVI [id number]  Use to check blood pressure daily [name], MD  Active Self, Other, Pharmacy Records  BREZTRI  AEROSPHERE 160-9-4.8 MCG/ACT AERO inhaler [id number] No INHALE 2 PUFFS BY MOUTH INTO THE LUNGS 2TIMES DAILY [name], MD Past Week Active Self, Other, Pharmacy Records  cetirizine  (ZYRTEC ) 10 MG tablet [id number] No Take 1 tablet (10 mg total) by mouth at  bedtime. [name], MD Past Week Active Self, Other, Pharmacy Records  cholecalciferol  (VITAMIN D3) 25 MCG (1000 UNIT) tablet [id number] No Take 1,000 Units by mouth daily. [provider] Past Week Active Self, Other, Pharmacy Records  Cyanocobalamin  (VITAMIN B-12) 1000 MCG SUBL [id number] No Place 1 tablet under the tongue daily. [provider] Past Week Active Self, Other, Pharmacy Records  dapagliflozin  propanediol  (FARXIGA ) 10 MG TABS tablet [id number] No TAKE 1 TABLET BY MOUTH DAILY [name], Nancyann BRAVO, MD Past Week Active Self, Other, Pharmacy Records  famotidine  (PEPCID ) 20 MG tablet [id number] No TAKE 1 TABLET BY MOUTH TWICE A DAY [name], Nancyann BRAVO, MD Past Week Active Self, Other, Pharmacy Records  glucose blood (ACCU-CHEK GUIDE) test strip [id number]  Use as instructed to check sugar daily for type 2 diabetes. [name], MD  Active Self, Other, Pharmacy Records  iron  polysaccharides [facility] 150) 150 MG capsule [id number] No Take one capsul every Monday, Wednesday, and Friday [name], MD Past Week Active Self, Other, Pharmacy Records  metFORMIN  (GLUCOPHAGE -XR) 500 MG 24 hr tablet [id number] No TAKE 1 TABLET BY MOUTH DAILY WITH EVENING MEAL [name], MD Past Week Active Self, Other, Pharmacy Records  methocarbamol  (ROBAXIN ) 500 MG tablet [id number]  TAKE 1 TABLET BY MOUTH EVERY 8 HOURS AS NEEDED FOR MUSCLE SPASMS [name], MD  Active Self, Other, Pharmacy Records           Med Note [facility], [place] A   Wed Oct 18, 2024  2:59 PM) prn  metoprolol  succinate (TOPROL -XL) 25 MG 24 hr tablet [id number] No Take 1.5 tablets (37.5 mg total) by mouth daily. Dorinda Drue DASEN, MD Past Week Active Other, Pharmacy Records  nitroGLYCERIN  (NITROSTAT ) 0.4 MG SL tablet [id number]  Place 1 tablet (0.4 mg total) under the tongue every 5 (five) minutes as needed for chest pain (Do not take if blood pressure is low, systolic BP<110). [name], MD  Active Self, Other, Pharmacy Records           Med Note [facility], [facility] A   Wed Oct 18, 2024  3:00 PM) prn  omega-3 acid ethyl esters (LOVAZA ) 1 g capsule [id number] No TAKE 4 CAPSULES (4 GRAMS TOTAL) BY WARDEN [name], MD Past Week Active Self, Other, Pharmacy Records  oxyCODONE -acetaminophen  (PERCOCET) 10-325 MG tablet [id number]  TAKE 1 TABLET BY MOUTH EVERY 4 HOURS AS NEEDED FOR PAIN [name], MD  Active Other, Pharmacy Records           Med  Note [facility], [facility] A   Wed Oct 18, 2024  3:01 PM) prn  OXYGEN  [id number]  Inhale 2 L into the lungs at bedtime as needed (for shortness of breath). [provider]  Active Self, Other, Pharmacy Records  pantoprazole  (PROTONIX ) 40 MG tablet [id number] No TAKE 1 TABLET BY MOUTH 2 TIMES DAILY ( BEFORE A MEAL) [name], MD Past Month Active Self, Other, Pharmacy Records  ranolazine  (RANEXA ) 1000 MG SR tablet [id number] No Take 1 tablet (1,000 mg total) by mouth 2 (two) times daily. Dorinda Drue DASEN, MD Past Month Active Self, Other, Pharmacy Records  sacubitril -valsartan  (ENTRESTO ) 49-51 MG [id number] No Take 1 tablet by mouth 2 (two) times daily. [name], MD Past Week Active Self, Other, Pharmacy Records  traZODone  (DESYREL ) 150 MG tablet [id number]  TAKE 1 TABLET BY MOUTH AT BEDTIME AS NEEDED FOR SLEEP. [name], MD  Active Self, Other, Pharmacy Records  Med Note ([facility], [place] A   Wed Oct 18, 2024  3:02 PM) prn  Med List Note [name], Richerd GRADE, [place] 10/23/24 1218): 10/23/24 Medications reviewed with grandson [name] patient had not discontinued the medications listed to stop on AVS: STOP taking: isosorbide  mononitrate 120 MG 24 hr tablet (IMDUR ) ondansetron  4 MG tablet (Zofran ). [name] state he has removed them at this time  06/16/24 Reviewed with grandson, [name] with patient's permission today 06/29/24 Medications reviewed with grandson, [name] who assist in managing patient's medications, Patient's medications list from 06/23/24 AVS reviewed and patient to restart Imdur  and Metoprolol . Patient didn't have Metoprolol . He is to check cabinet where his paused medications were.            Home Care and Equipment/Supplies: Were Home Health Services Ordered?: No Name of Home Health Agency::  (Patient states, Therapist told me that I was already at my baseline and there was nothing else to do at this time) EMR reviewed for Home Health Orders: Home Health Not  Ordered Any new equipment or medical supplies ordered?: No  Functional Questionnaire: Do you need assistance with bathing/showering or dressing?: No Do you need assistance with meal preparation?: Yes Enrigue and I get Meals on Wheels) Do you need assistance with eating?: No Do you have difficulty maintaining continence: No Do you need assistance with getting out of bed/getting out of a chair/moving?: No Do you have difficulty managing or taking your medications?: Yes Enrigue helps me and handles my medications)  Follow up appointments reviewed: PCP Follow-up appointment confirmed?: No (I will need to set up my transportation and I want to call) Specialist Hospital Follow-up appointment confirmed?: No Follow-Up Specialty Provider:: [place] (Patient wants to set up transporation and needs to confirm somethings with Medicaid cause I had to reapply) Do you need transportation to your follow-up appointment?: No (Uses Medicaid transportation) Do you understand care options if your condition(s) worsen?: Yes-patient verbalized understanding  SDOH Interventions Today    Flowsheet Row Most Recent Value  SDOH Interventions   Food Insecurity Interventions Intervention Not Indicated  [offered [employer] contact for meals patient declines state,oh I already get Meals on Wheels]  Housing Interventions Intervention Not Indicated  Transportation Interventions Intervention Not Indicated    Goals Addressed             This Visit's Progress    VBCI Transitions of Care (TOC) Care Plan       Problems:  Recent Hospitalization for treatment of COPD Knowledge Deficit Related to post hospital care COPD exacerbation, No Hospital Follow Up Provider appointment patient desires to make own appointments, and SDOH barrier: Patient currently declines any needs  Goal:  Over the next 30 days, the patient will not experience hospital readmission  Interventions:   COPD  Interventions: Advised patient to engage in light exercise as tolerated 3-5 days a week to aid in the the management of COPD Advised patient to track and manage COPD triggers Advised patient to self assesses COPD action plan zone and make appointment with provider if in the yellow zone for 48 hours without improvement Assessed social determinant of health barriers Discussed the importance of adequate rest and management of fatigue with COPD Provided education about and advised patient to utilize infection prevention strategies to reduce risk of respiratory infection Screening for signs and symptoms of depression related to chronic disease state  Use of home oxygen   Patient Self Care Activities:  Attend all scheduled provider appointments Call pharmacy  for medication refills 3-7 days in advance of running out of medications Call provider office for new concerns or questions  Notify RN Care Manager of TOC call rescheduling needs Participate in Transition of Care Program/Attend TOC scheduled calls Take medications as prescribed    Plan:  The patient has been provided with contact information for the care management team and has been advised to call with any health related questions or concerns.      TOC Consent to [facility] Information   Following recent discharge, the patient has enrolled in the Transition of Care [facility]) Program and requested that post-discharge communication be directed to a designated support person. The patient has consented for the [facility] team to share information with the individual(s) named below to support care coordination, medication review, and follow-up planning.  Authorized Individual(s): - Name:  Myka Lukins - Relationship to Patient:  grandson - Contact Info: [phone] Patient verbalized understanding of the consent and confirmed their request for the [facility] team to communicate with the above-named individual(s) for the duration of the [facility] program  enrollment. Patient was informed that this consent may be revoked at any time.   10/23/24 Discussed and offered 30 day TOC program.  Patient agrees to weekly calls in 30 day program.  The patient has been provided with contact information for the care management team and has been advised to call with any health -related questions or concerns.  The patient verbalized understanding with current plan of care.  The patient is directed to their insurance card regarding availability of benefits coverage.  Discussion with patient regarding follow up with counseling and states he's awaiting a call with referral made in November. Offered LCSW follow up and patient agrees, that might help till I get to the counselor.  Denies any immediate needs today. Will make referral.        Richerd Fish, RN, BSN, CCM [address]  [facility], [facility] Care Management Coordinator Direct Dial: [phone]

## 2024-10-23 NOTE — Patient Instructions (Signed)
 Visit Information  Thank you for taking time to visit with me today. Please don't hesitate to contact me if I can be of assistance to you before our next scheduled telephone appointment.  Our next appointment is by telephone on 10/31/24 at 11:00 am  Following is a copy of your care plan:   Goals Addressed             This Visit's Progress    VBCI Transitions of Care (TOC) Care Plan       Problems:  Recent Hospitalization for treatment of COPD Knowledge Deficit Related to post hospital care COPD exacerbation, No Hospital Follow Up Provider appointment patient desires to make own appointments, and SDOH barrier: Patient currently declines any needs  Goal:  Over the next 30 days, the patient will not experience hospital readmission  Interventions:   COPD Interventions: Advised patient to engage in light exercise as tolerated 3-5 days a week to aid in the the management of COPD Advised patient to track and manage COPD triggers Advised patient to self assesses COPD action plan zone and make appointment with provider if in the yellow zone for 48 hours without improvement Assessed social determinant of health barriers Discussed the importance of adequate rest and management of fatigue with COPD Provided education about and advised patient to utilize infection prevention strategies to reduce risk of respiratory infection Screening for signs and symptoms of depression related to chronic disease state  Use of home oxygen   Patient Self Care Activities:  Attend all scheduled provider appointments Call pharmacy for medication refills 3-7 days in advance of running out of medications Call provider office for new concerns or questions  Notify RN Care Manager of TOC call rescheduling needs Participate in Transition of Care Program/Attend TOC scheduled calls Take medications as prescribed    Plan:  The patient has been provided with contact information for the care management team and has  been advised to call with any health related questions or concerns.      TOC Consent to [facility] Information   Following recent discharge, the patient has enrolled in the Transition of Care [facility]) Program and requested that post-discharge communication be directed to a designated support person. The patient has consented for the [facility] team to share information with the individual(s) named below to support care coordination, medication review, and follow-up planning.  Authorized Individual(s): - Name:  Thelton Graca - Relationship to Patient:  grandson - Contact Info: [phone] Patient verbalized understanding of the consent and confirmed their request for the [facility] team to communicate with the above-named individual(s) for the duration of the [facility] program enrollment. Patient was informed that this consent may be revoked at any time.   10/23/24 Discussed and offered 30 day TOC program.  Patient agrees to weekly calls in 30 day program.  The patient has been provided with contact information for the care management team and has been advised to call with any health -related questions or concerns.  The patient verbalized understanding with current plan of care.  The patient is directed to their insurance card regarding availability of benefits coverage.  Discussion with patient regarding follow up with counseling and states he's awaiting a call with referral made in November. Offered LCSW follow up and patient agrees, that might help till I get to the counselor.  Denies any immediate needs today. Will make referral.        The patient verbalized understanding of instructions, educational materials, and care plan provided today and DECLINED  offer to receive copy of patient instructions, educational materials, and care plan.   The patient has been provided with contact information for the care management team and has been advised to call with any health related questions or concerns.  The care  management team will reach out to the patient again over the next 5- 10 business days.   Please call the care guide team at [phone] if you need to cancel or reschedule your appointment.   Please call the Suicide and Crisis Lifeline: 988 call the [place]  National Suicide Prevention Lifeline: 762-007-1562 or TTY: 210 195 6208 TTY 662-226-4607) to talk to a trained counselor call 1-800-273-TALK (toll free, 24 hour hotline) call 911 if you are experiencing a Mental Health or Behavioral Health Crisis or need someone to talk to.  Richerd Fish, RN, BSN, CCM [facility], [facility] Management Coordinator Direct Dial: [phone]

## 2024-10-31 ENCOUNTER — Telehealth: Payer: Self-pay

## 2024-10-31 ENCOUNTER — Observation Stay: Admission: EM | Admit: 2024-10-31 | Discharge: 2024-11-02 | Disposition: A

## 2024-10-31 ENCOUNTER — Emergency Department

## 2024-10-31 ENCOUNTER — Other Ambulatory Visit: Payer: Self-pay

## 2024-10-31 ENCOUNTER — Ambulatory Visit: Payer: Self-pay

## 2024-10-31 ENCOUNTER — Other Ambulatory Visit

## 2024-10-31 DIAGNOSIS — Z87891 Personal history of nicotine dependence: Secondary | ICD-10-CM | POA: Diagnosis not present

## 2024-10-31 DIAGNOSIS — R0789 Other chest pain: Secondary | ICD-10-CM | POA: Diagnosis not present

## 2024-10-31 DIAGNOSIS — I4891 Unspecified atrial fibrillation: Secondary | ICD-10-CM

## 2024-10-31 DIAGNOSIS — R079 Chest pain, unspecified: Secondary | ICD-10-CM | POA: Diagnosis present

## 2024-10-31 DIAGNOSIS — E119 Type 2 diabetes mellitus without complications: Secondary | ICD-10-CM | POA: Diagnosis not present

## 2024-10-31 DIAGNOSIS — I110 Hypertensive heart disease with heart failure: Secondary | ICD-10-CM | POA: Diagnosis not present

## 2024-10-31 DIAGNOSIS — Z79899 Other long term (current) drug therapy: Secondary | ICD-10-CM | POA: Diagnosis not present

## 2024-10-31 DIAGNOSIS — F419 Anxiety disorder, unspecified: Secondary | ICD-10-CM | POA: Diagnosis not present

## 2024-10-31 DIAGNOSIS — I5032 Chronic diastolic (congestive) heart failure: Secondary | ICD-10-CM | POA: Diagnosis not present

## 2024-10-31 DIAGNOSIS — F32A Depression, unspecified: Secondary | ICD-10-CM | POA: Diagnosis not present

## 2024-10-31 DIAGNOSIS — I2510 Atherosclerotic heart disease of native coronary artery without angina pectoris: Secondary | ICD-10-CM | POA: Diagnosis not present

## 2024-10-31 DIAGNOSIS — J441 Chronic obstructive pulmonary disease with (acute) exacerbation: Secondary | ICD-10-CM | POA: Diagnosis not present

## 2024-10-31 DIAGNOSIS — K219 Gastro-esophageal reflux disease without esophagitis: Secondary | ICD-10-CM | POA: Diagnosis not present

## 2024-10-31 LAB — COMPREHENSIVE METABOLIC PANEL WITH GFR
ALT: 22 U/L (ref 0–44)
AST: 19 U/L (ref 15–41)
Albumin: 3.9 g/dL (ref 3.5–5.0)
Alkaline Phosphatase: 66 U/L (ref 38–126)
Anion gap: 12 (ref 5–15)
BUN: 24 mg/dL — ABNORMAL HIGH (ref 8–23)
CO2: 27 mmol/L (ref 22–32)
Calcium: 9.8 mg/dL (ref 8.9–10.3)
Chloride: 101 mmol/L (ref 98–111)
Creatinine, Ser: 1.01 mg/dL (ref 0.61–1.24)
GFR, Estimated: >60 mL/min (ref >60)
Glucose, Bld: 82 mg/dL (ref 70–99)
Potassium: 3.8 mmol/L (ref 3.5–5.1)
Sodium: 140 mmol/L (ref 135–145)
Total Bilirubin: 0.7 mg/dL (ref 0.0–1.2)
Total Protein: 6.3 g/dL — ABNORMAL LOW (ref 6.5–8.1)

## 2024-10-31 LAB — CBC WITH DIFFERENTIAL/PLATELET
Abs Immature Granulocytes: 0.04 K/uL (ref 0.00–0.07)
Basophils Absolute: 0 K/uL (ref 0.0–0.1)
Basophils Relative: 0 %
Eosinophils Absolute: 0.1 K/uL (ref 0.0–0.5)
Eosinophils Relative: 1 %
HCT: 52.6 % — ABNORMAL HIGH (ref 39.0–52.0)
Hemoglobin: 17 g/dL (ref 13.0–17.0)
Immature Granulocytes: 1 %
Lymphocytes Relative: 23 %
Lymphs Abs: 1.7 K/uL (ref 0.7–4.0)
MCH: 29.9 pg (ref 26.0–34.0)
MCHC: 32.3 g/dL (ref 30.0–36.0)
MCV: 92.4 fL (ref 80.0–100.0)
Monocytes Absolute: 0.6 K/uL (ref 0.1–1.0)
Monocytes Relative: 8 %
Neutro Abs: 5.2 K/uL (ref 1.7–7.7)
Neutrophils Relative %: 67 %
Platelets: 175 K/uL (ref 150–400)
RBC: 5.69 MIL/uL (ref 4.22–5.81)
RDW: 16.3 % — ABNORMAL HIGH (ref 11.5–15.5)
WBC: 7.7 K/uL (ref 4.0–10.5)
nRBC: 0 % (ref 0.0–0.2)

## 2024-10-31 LAB — RESP PANEL BY RT-PCR (RSV, FLU A&B, COVID)  RVPGX2
Influenza A by PCR: NEGATIVE
Influenza B by PCR: NEGATIVE
Resp Syncytial Virus by PCR: NEGATIVE
SARS Coronavirus 2 by RT PCR: NEGATIVE

## 2024-10-31 LAB — TROPONIN T, HIGH SENSITIVITY
Troponin T High Sensitivity: 32 ng/L — ABNORMAL HIGH (ref 0–19)
Troponin T High Sensitivity: 31 ng/L — ABNORMAL HIGH (ref 0–19)

## 2024-10-31 LAB — PRO BRAIN NATRIURETIC PEPTIDE: Pro Brain Natriuretic Peptide: 524 pg/mL — ABNORMAL HIGH (ref <300.0)

## 2024-10-31 LAB — LIPASE, BLOOD: Lipase: 41 U/L (ref 11–51)

## 2024-10-31 MED ORDER — DILTIAZEM HCL-DEXTROSE 125-5 MG/125ML-% IV SOLN (PREMIX)
5.0000 mg/h | INTRAVENOUS | Status: DC
  Administered 2024-10-31: 21:00:00 5 mg/h via INTRAVENOUS
  Filled 2024-10-31: qty 125

## 2024-10-31 MED ORDER — HYDROMORPHONE HCL 1 MG/ML IJ SOLN
0.5000 mg | Freq: Once | INTRAMUSCULAR | Status: AC
  Administered 2024-10-31: 19:00:00 0.5 mg via INTRAVENOUS
  Filled 2024-10-31: qty 0.5

## 2024-10-31 MED ORDER — PREDNISONE 20 MG PO TABS: 40.0000 mg | ORAL_TABLET | Freq: Every day | ORAL | 0 refills | Status: AC

## 2024-10-31 MED ORDER — LIDOCAINE VISCOUS HCL 2 % MT SOLN
15.0000 mL | Freq: Once | OROMUCOSAL | Status: AC
  Administered 2024-10-31: 20:00:00 15 mL via ORAL
  Filled 2024-10-31: qty 15

## 2024-10-31 MED ORDER — ALUM & MAG HYDROXIDE-SIMETH 200-200-20 MG/5ML PO SUSP
30.0000 mL | Freq: Once | ORAL | Status: AC
  Administered 2024-10-31: 20:00:00 30 mL via ORAL
  Filled 2024-10-31: qty 30

## 2024-10-31 MED ORDER — SODIUM CHLORIDE 0.9 % IV BOLUS
500.0000 mL | Freq: Once | INTRAVENOUS | Status: AC
  Administered 2024-10-31: 19:00:00 500 mL via INTRAVENOUS

## 2024-10-31 MED ORDER — ACETAMINOPHEN 500 MG PO TABS
1000.0000 mg | ORAL_TABLET | Freq: Once | ORAL | Status: AC
  Administered 2024-10-31: 19:00:00 1000 mg via ORAL
  Filled 2024-10-31: qty 2

## 2024-10-31 MED ORDER — HYDROMORPHONE HCL 1 MG/ML IJ SOLN
1.0000 mg | Freq: Once | INTRAMUSCULAR | Status: AC
  Administered 2024-10-31: 20:00:00 1 mg via INTRAVENOUS
  Filled 2024-10-31: qty 1

## 2024-10-31 MED ORDER — PANTOPRAZOLE SODIUM 40 MG IV SOLR
40.0000 mg | Freq: Once | INTRAVENOUS | Status: AC
  Administered 2024-10-31: 20:00:00 40 mg via INTRAVENOUS
  Filled 2024-10-31: qty 10

## 2024-10-31 MED ORDER — IPRATROPIUM-ALBUTEROL 0.5-2.5 (3) MG/3ML IN SOLN
3.0000 mL | Freq: Once | RESPIRATORY_TRACT | Status: AC
  Administered 2024-10-31: 19:00:00 3 mL via RESPIRATORY_TRACT
  Filled 2024-10-31: qty 3

## 2024-10-31 MED ORDER — ONDANSETRON HCL 4 MG/2ML IJ SOLN
4.0000 mg | Freq: Once | INTRAMUSCULAR | Status: AC
  Administered 2024-10-31: 20:00:00 4 mg via INTRAVENOUS
  Filled 2024-10-31: qty 2

## 2024-10-31 MED ORDER — METHYLPREDNISOLONE SODIUM SUCC 125 MG IJ SOLR
125.0000 mg | Freq: Once | INTRAMUSCULAR | Status: AC
  Administered 2024-10-31: 19:00:00 125 mg via INTRAVENOUS
  Filled 2024-10-31: qty 2

## 2024-10-31 MED ORDER — METOPROLOL TARTRATE 25 MG PO TABS
25.0000 mg | ORAL_TABLET | Freq: Once | ORAL | Status: AC
  Administered 2024-10-31: 23:00:00 25 mg via ORAL
  Filled 2024-10-31: qty 1

## 2024-10-31 MED ORDER — ONDANSETRON HCL 4 MG/2ML IJ SOLN
4.0000 mg | Freq: Once | INTRAMUSCULAR | Status: AC
  Administered 2024-10-31: 23:00:00 4 mg via INTRAVENOUS
  Filled 2024-10-31: qty 2

## 2024-10-31 NOTE — Telephone Encounter (Signed)
 FYI Only or Action Required?: FYI only for provider: ED advised.  Patient was last seen in primary care on 08/11/2024 by Lawrence Nancyann BRAVO, MD.  Called Nurse Triage reporting Hypertension (Recent medication changes. ).  Symptoms began several days ago.  Interventions attempted: Prescription medications: Metoprolol  and Rest, hydration, or home remedies.  Symptoms are: gradually worsening- chest pain and headache.   Triage Disposition: Call EMS 911 Now  Patient/caregiver understands and will follow disposition?: Yes    Copied from CRM #[id number]. Topic: Clinical - Red Word Triage >> Oct 31, 2024  2:19 PM Avram MATSU wrote: Red Word that prompted transfer to Nurse Triage: [place] patients case manager stated pt blood pressure has been high 148/101 and recently checked 149/101. Patient has no other symptoms but has been like for days    Reason for Disposition  [1] Chest pain lasts > 5 minutes AND [2] history of heart disease (i.e., heart attack, bypass surgery, angina, angioplasty, CHF)  Answer Assessment - Initial Assessment Questions Patient's case manager made initial call to triage RN to make aware of recent BP readings since medication changes, patient also needs to schedule hospital follow-up visit.  Patient is to follow-up within one week of discharge (or sooner if based on triage).    Patient not available during this initial incoming call with case manager.  This triage RN attempted to call patient directly and left voicemail.  Case manager states patient is alert/oriented and will be able to answer questions appropriately.   Case manager states patient has not reported any other symptoms aside from increasing BP.   BP started to increase 3 days ago.  Was discharged from [facility] on 12/6 after admission for COPD exacerbation.  Case manager states medications often get changed when in hospital and now BP is going up, would like to make Dr. Gasper aware.    Patient reportedly  has been feeling pretty good, slept better last night and has been taking prescribed pain medications.  No headache or vision changes reported to case manager. Patient has has some frustration with paperwork and loss of Medicaid coverage affecting transportation.    BP was still up at 149/105 at end of recent call between patient and case manager prior to this triage call.   Case manager has messaged social work to assist with Medicaid and transportation needs.  Patient has limited options, could possibly Uber in [place].  Son unable to assist due to work commitments.      Patient returns call at 1452 and speaks with this same triage RN, assessment as follows:   1. BLOOD PRESSURE: What is your blood pressure? Did you take at least two measurements 5 minutes apart? 149/105 with case manager;  BP during patient callback at 1455:  183/98, HR 95 (sitting down, arm heart level, not talking during check)  5. MEDICINES: Are you taking any medicines for blood pressure? Have you missed any doses recently?     Metoprolol   6. OTHER SYMPTOMS: Do you have any symptoms? (e.g., blurred vision, chest pain, difficulty breathing, headache, weakness)  Headache- 8/10, constant and during call Chest pain- 8/10, constant and during call  Advised calling 911, pt declines Triage RN call for him, states will call family member to make aware and call 911.   Called Richerd Fish back @ 1500 to make aware, left secure voicemail with patient's first name and 911 Disposition passed along.  Protocols used: Blood Pressure - High-A-AH

## 2024-10-31 NOTE — Patient Instructions (Signed)
 Visit Information  Thank you for taking time to visit with me today. Please don't hesitate to contact me if I can be of assistance to you before our next scheduled appointment.  Our next appointment is by telephone on 11/02/24 at 1130 Please call the care guide team at [phone] if you need to cancel or reschedule your appointment.   Following is a copy of your care plan:   Goals Addressed             This Visit's Progress    BSW Goal       Current SDOH Barriers:  Transportation  Interventions: Provided patient with information about [employer]. SW contacted [employer] [phone] to inquire if they covered [facility]. [name] will contact SW back to inform if they cover [place] or just [employer].  Thersia Hoar, BSW, MHA [place]  [facility] Social Worker, Population Health [phone]           Please call the Suicide and Crisis Lifeline: 988 call the [place]  National Suicide Prevention Lifeline: (925)018-3486 or TTY: 204-201-1973 TTY 913-313-4556) to talk to a trained counselor call 1-800-273-TALK (toll free, 24 hour hotline) call 911 if you are experiencing a Mental Health or Behavioral Health Crisis or need someone to talk to.  Patient verbalized understanding of Care plan and visit instructions communicated this visit  Thersia Hoar, BSW, [facility] [place]  [facility] Social Worker, Population Health [phone]

## 2024-10-31 NOTE — Transitions of Care (Post Inpatient/ED Visit) (Signed)
 Transition of Care week 2  Visit Note  10/31/2024  Name: Lawrence [name] MRN: [id number]          DOB: Jan 02, 1956  Situation: Patient enrolled in [facility] 30-day program. Visit completed with patient by telephone.   Background:   Initial Transition Care Management Follow-up Telephone Call Discharge Date and Diagnosis: 10/21/24, COPD exacerbation   Past Medical History:  Diagnosis Date   A-fib (HCC)    Anemia    Anginal pain    Anxiety    Arthritis    Asthma    Atrial fibrillation, chronic (HCC) 10/19/2023   CAD (coronary artery disease)    a. 2002 CABGx2 (LIMA->LAD, VG->VG->OM1);  b. 09/2012 DES->OM;  c. 03/2015 PTCA of LAD [facility]) in setting of atretic LIMA; d. 05/2015 Cath [facility]): nonobs dzs; e. 06/2015 Cath ([facility]): LM nl, LAD 45p/d ISR, 50d, D1/2 small, LCX 50p/d ISR, OM1 70ost, 30 ISR, VG->OM1 50ost, 32m, LIMA->LAD 99p/d - atretic, RCA dom, nl; f.cath 10/16: 40-50%(FFR 0.90) pLAD, 75% (FFR 0.77) mLAD s/p PCI/DES, oRCA 40% (FFR0.95)   Cancer (HCC)    SKIN CANCER ON BACK   Celiac disease    Chronic diastolic CHF (congestive heart failure) (HCC)    a. 06/2009 Echo: EF 60-65%, Gr 1 DD, triv AI, mildly dil LA, nl RV.   COPD (chronic obstructive pulmonary disease) (HCC)    a. Chronic bronchitis and emphysema.   DDD (degenerative disc disease), lumbar    Diverticulosis    Dysrhythmia    Essential hypertension    GERD (gastroesophageal reflux disease)    History of hiatal hernia    History of kidney stones    H/O   History of tobacco abuse    a. Quit 2014.   Myocardial infarction [facility]) 2002   4 STENTS   Pancreatitis    PSVT (paroxysmal supraventricular tachycardia)    a. 10/2012 Noted on Zio Patch.   RLL pneumonia 05/30/2024   Sleep apnea    LOST CORD TO CPAP -ONLY 02 @ BEDTIME   Tubular adenoma of colon    Type II diabetes mellitus (HCC)     Assessment: Patient Reported Symptoms: Cognitive Cognitive Status: Able to follow simple commands, Alert and oriented to person,  place, and time, Normal speech and language skills, Struggling with memory recall      Neurological Neurological Review of Symptoms: Weakness, Numbness (neuropathy - chronic)    HEENT HEENT Symptoms Reported: No symptoms reported      Cardiovascular Cardiovascular Symptoms Reported: No symptoms reported Does patient have uncontrolled Hypertension?: Yes Is patient checking Blood Pressure at home?: Yes Patient's Recent BP reading at home: 148/101 before meds Cardiovascular Management Strategies: Medication therapy, Routine screening, Weight management Do You Have a Working Readable Scale?: Yes Weight: 171 lb (77.6 kg) Cardiovascular Self-Management Outcome: 3 (uncertain)  Respiratory Respiratory Symptoms Reported: Productive cough, Wheezing, Shortness of breath Other Respiratory Symptoms: clear, a little wheezing Respiratory Management Strategies: Activity, Adequate rest, CPAP, Oxygen  therapy, Routine screening (CPAP mask is broken and I am suppose to send it back) Respiratory Self-Management Outcome: 3 (uncertain) (Patient states, I feel really good today but I am coughing up clear stuff with a little of shortness of breath but not bad today)  Endocrine Endocrine Symptoms Reported: No symptoms reported Is patient checking blood sugars at home?: Yes List most recent blood sugar readings, include date and time of day: 137 Endocrine Self-Management Outcome: 3 (uncertain)  Gastrointestinal Gastrointestinal Symptoms Reported: No symptoms reported Gastrointestinal Self-Management Outcome: 4 (good)  Genitourinary Genitourinary Symptoms Reported: Other, Difficulty initiating stream Other Genitourinary Symptoms: gets better once it gets started    Integumentary Integumentary Symptoms Reported: No symptoms reported Additional Integumentary Details: make sure feet are good    Musculoskeletal Musculoskelatal Symptoms Reviewed: Unsteady gait, Weakness Additional Musculoskeletal Details:  chronic aches and pain        Psychosocial Psychosocial Symptoms Reported: Anxiety - if selected complete GAD, Depression - if selected complete PHQ 2-9         Today's Vitals   10/31/24 1103  BP: (!) 148/101  Pulse: 97  Weight: 171 lb (77.6 kg)   Pain Scale: 0-10 Pain Score: 4  Pain Type: Chronic pain Pain Location: Back Pain Orientation: Lower Pain Descriptors / Indicators: Nagging, Spasm Pain Onset: On-going  Medications Reviewed Today     Reviewed by Eilleen Richerd GRADE, RN (Registered Nurse) on 10/31/24 at 1123  Med List Status: <None>   Medication Order Taking? Sig Documenting Provider Last Dose Status Informant  Accu-Chek Softclix Lancets lancets [id number] Yes Use as instructed to check sugar daily for type 2 diabetes. [name], MD  Active Self, Other, Pharmacy Records  acetaminophen  (TYLENOL ) 325 MG tablet [id number]  Take 2 tablets (650 mg total) by mouth every 6 (six) hours as needed for mild pain (pain score 1-3) or fever (or Fever >/= 101).  Patient not taking: Reported on 10/31/2024   Dorinda Drue DASEN, MD  Active Self, Other, Pharmacy Records           Med Note [name], RICHERD GRADE Kitchens Oct 23, 2024  9:32 AM) Needs to pick some up  albuterol  (PROVENTIL ) (2.5 MG/3ML) 0.083% nebulizer solution [id number] Yes Take 3 mLs (2.5 mg total) by nebulization every 6 (six) hours as needed for wheezing or shortness of breath. [name], MD  Active Self, Other, Pharmacy Records           Med Note [facility], [facility] A   Wed Oct 18, 2024  2:57 PM) prn  allopurinol  (ZYLOPRIM ) 300 MG tablet [id number] Yes TAKE 1 TABLET BY MOUTH TWICE A DAY [name], MD  Active Self, Other, Pharmacy Records  ALPRAZolam  (XANAX ) 1 MG tablet [id number] Yes TAKE 1 TABLET BY MOUTH 3 TIMES A DAY AS NEEDED [name], MD  Active Self, Other, Pharmacy Records           Med Note [facility], [place] A   Wed Oct 18, 2024  2:57 PM) prn  apixaban  (ELIQUIS ) 5 MG TABS tablet [id number] Yes Take 1  tablet (5 mg total) by mouth 2 (two) times daily. [name], MD  Active Self, Other, Pharmacy Records  atorvastatin  (LIPITOR ) 80 MG tablet [id number] Yes TAKE 1 TABLET BY MOUTH AT BEDTIME [name], MD  Active Self, Other, Pharmacy Records  Blood Glucose Monitoring Suppl (ACCU-CHEK GUIDE) w/Device KIT [id number] Yes Use to check blood sugars as directed [name], MD  Active Self, Other, Pharmacy Records  Blood Pressure Monitor DEVI [id number] Yes Use to check blood pressure daily [name], MD  Active Self, Other, Pharmacy Records  BREZTRI  AEROSPHERE 160-9-4.8 MCG/ACT AERO inhaler [id number] Yes INHALE 2 PUFFS BY MOUTH INTO THE LUNGS 2TIMES DAILY [name], MD  Active Self, Other, Pharmacy Records  cetirizine  (ZYRTEC ) 10 MG tablet [id number] Yes Take 1 tablet (10 mg total) by mouth at bedtime. [name], MD  Active Self, Other, Pharmacy Records  cholecalciferol  (VITAMIN D3) 25 MCG (1000 UNIT) tablet [id number] Yes  Take 1,000 Units by mouth daily. [provider]  Active Self, Other, Pharmacy Records  Cyanocobalamin  (VITAMIN B-12) 1000 MCG SUBL [id number] Yes Place 1 tablet under the tongue daily. [provider]  Active Self, Other, Pharmacy Records  dapagliflozin  propanediol (FARXIGA ) 10 MG TABS tablet [id number] Yes TAKE 1 TABLET BY MOUTH DAILY [name], Nancyann BRAVO, MD  Active Self, Other, Pharmacy Records  famotidine  (PEPCID ) 20 MG tablet [id number] Yes TAKE 1 TABLET BY MOUTH TWICE A DAY [name], MD  Active Self, Other, Pharmacy Records  glucose blood (ACCU-CHEK GUIDE) test strip [id number] Yes Use as instructed to check sugar daily for type 2 diabetes. [name], MD  Active Self, Other, Pharmacy Records  iron  polysaccharides [facility] 150) 150 MG capsule [id number] Yes Take one capsul every Monday, Wednesday, and Friday [name], MD  Active Self, Other, Pharmacy Records  metFORMIN  (GLUCOPHAGE -XR) 500 MG 24 hr tablet [id number] Yes  TAKE 1 TABLET BY MOUTH DAILY WITH EVENING MEAL [name], MD  Active Self, Other, Pharmacy Records  methocarbamol  (ROBAXIN ) 500 MG tablet [id number] Yes TAKE 1 TABLET BY MOUTH EVERY 8 HOURS AS NEEDED FOR MUSCLE SPASMS [name], MD  Active   metoprolol  succinate (TOPROL -XL) 25 MG 24 hr tablet [id number] Yes Take 1.5 tablets (37.5 mg total) by mouth daily. Dorinda Drue DASEN, MD  Active Other, Pharmacy Records  nitroGLYCERIN  (NITROSTAT ) 0.4 MG SL tablet [id number] Yes Place 1 tablet (0.4 mg total) under the tongue every 5 (five) minutes as needed for chest pain (Do not take if blood pressure is low, systolic BP<110). [name], MD  Active Self, Other, Pharmacy Records           Med Note [facility], [facility] A   Wed Oct 18, 2024  3:00 PM) prn  omega-3 acid ethyl esters (LOVAZA ) 1 g capsule [id number] Yes TAKE 4 CAPSULES (4 GRAMS TOTAL) BY WARDEN [name], MD  Active Self, Other, Pharmacy Records  oxyCODONE -acetaminophen  (PERCOCET) 10-325 MG tablet [id number] Yes TAKE 1 TABLET BY MOUTH EVERY 4 HOURS AS NEEDED FOR PAIN [name], MD  Active Other, Pharmacy Records           Med Note [facility], [facility] A   Wed Oct 18, 2024  3:01 PM) prn  OXYGEN  [id number] Yes Inhale 2 L into the lungs at bedtime as needed (for shortness of breath). [provider]  Active Self, Other, Pharmacy Records  pantoprazole  (PROTONIX ) 40 MG tablet [id number] Yes TAKE 1 TABLET BY MOUTH 2 TIMES DAILY (30MINUTES BEFORE A MEAL) [name], MD  Active Self, Other, Pharmacy Records  ranolazine  (RANEXA ) 1000 MG SR tablet [id number] Yes Take 1 tablet (1,000 mg total) by mouth 2 (two) times daily. Dorinda Drue DASEN, MD  Active Self, Other, Pharmacy Records  sacubitril -valsartan  (ENTRESTO ) 49-51 MG [id number] Yes Take 1 tablet by mouth 2 (two) times daily. [name], MD  Active Self, Other, Pharmacy Records  traZODone  (DESYREL ) 150 MG tablet [id number] Yes TAKE 1 TABLET BY MOUTH AT BEDTIME AS NEEDED FOR SLEEP.  [name], MD  Active Self, Other, Pharmacy Records           Med Note [facility], [facility] A   Wed Oct 18, 2024  3:02 PM) prn  Med List Note [name], Richerd GRADE, [place] 10/23/24 1218): 10/23/24 Medications reviewed with grandson [name] patient had not discontinued the medications listed to stop on AVS: STOP taking: isosorbide  mononitrate 120 MG 24 hr tablet (IMDUR ) ondansetron  4 MG  tablet (Zofran ). [name] state he has removed them at this time  06/16/24 Reviewed with grandson, [name] with patient's permission today 06/29/24 Medications reviewed with grandson, [name] who assist in managing patient's medications, Patient's medications list from 06/23/24 AVS reviewed and patient to restart Imdur  and Metoprolol . Patient didn't have Metoprolol . He is to check cabinet where his paused medications were.            Recommendation:   PCP Follow-up - Provider office contacted regarding patient stating BP has been elevated for 3 days and denies visual changed, headaches, increased weakness. Stating feels that worrying about getting his Medicaid back and his Medicaid transportation.   Follow Up Plan:   Telephone follow-up in 1 week Referral to BSW assist with transportation follow up with Medicaid and appointments was made  Richerd Fish, RN, SCIENTIST, RESEARCH (PHYSICAL SCIENCES), CCM [place]  [facility], [facility] Management Coordinator Direct Dial: [phone]

## 2024-10-31 NOTE — ED Provider Notes (Signed)
 Lawrence [name] Provider Note    Event Date/Time   First MD Initiated Contact with Patient 10/31/24 1814     (approximate)   History   Chest Pain  Patient arrives from via [facility] with chief complaint of chest pain. Patient states CP that radiates to the shoulder which came on at rest around 1600 today. Patient states he took 4 baby Aspirin , 3 Nitroglycerin , and an Oxycodone . Patient states he still had no relief from pain after medication administration. Patient has history of Afib and open heart surgery in 2002. MD Lawrence [name] at bedside.    HPI Lawrence [name] is a 68 y.o. male PMH COPD on 2 L nasal cannula, CHF, frequent ED visits, CAD with prior CABG, hyperlipidemia, A-fib on Eliquis  presents for evaluation of chest pain - Patient states he started having chest pain around 4 PM.  Notes he took 3 doses of nitroglycerin  and 324 mg of aspirin . - Describes it as substernal chest discomfort.  Says that this radiates into his left arm. - Says he does have some mild ongoing chest discomfort despite his oxycodone  which he took at home - Denies any frank abdominal pain - Does feel somewhat short of breath  Per chart review, last seen in our emergency department by myself on 10/16/2024.  Ultimately admitted for COPD exacerbation and A-fib RVR.     Physical Exam   Triage Vital Signs: BP (!) 129/93   Pulse 93   Temp (!) 97.5 F (36.4 C) (Oral)   Resp 15   Ht 5' 7 (1.702 m)   Wt 78.9 kg   SpO2 98%   BMI 27.25 kg/m    Most recent vital signs: Vitals:   10/31/24 2246 10/31/24 2300  BP:  (!) 129/93  Pulse: (!) 104 93  Resp: (!) 9 15  Temp:    SpO2: 100% 98%     General: Awake, no distress.  CV:  Good peripheral perfusion.  Tachycardic, irregular rhythm, RP 2+ Resp:  Normal effort. CTAB Abd:  No distention. Nontender to deep palpation throughout Other:  No lower extremity edema appreciated   ED Results / Procedures / Treatments   Labs (all labs ordered  are listed, but only abnormal results are displayed) Labs Reviewed  COMPREHENSIVE METABOLIC PANEL WITH GFR - Abnormal; Notable for the following components:      Result Value   BUN 24 (*)    Total Protein 6.3 (*)    All other components within normal limits  CBC WITH DIFFERENTIAL/PLATELET - Abnormal; Notable for the following components:   HCT 52.6 (*)    RDW 16.3 (*)    All other components within normal limits  PRO BRAIN NATRIURETIC PEPTIDE - Abnormal; Notable for the following components:   Pro Brain Natriuretic Peptide 524.0 (*)    All other components within normal limits  TROPONIN T, HIGH SENSITIVITY - Abnormal; Notable for the following components:   Troponin T High Sensitivity 32 (*)    All other components within normal limits  TROPONIN T, HIGH SENSITIVITY - Abnormal; Notable for the following components:   Troponin T High Sensitivity 31 (*)    All other components within normal limits  RESP PANEL BY RT-PCR (RSV, FLU A&B, COVID)  RVPGX2  LIPASE, BLOOD     EKG  ECG INTERPRETATION    RADIOLOGY Radiology interpreted myself and radiology report reviewed.  No acute pathology identified.    PROCEDURES:  Critical Care performed: No  .1-3 Lead EKG Interpretation  Performed  by: Clarine Ozell LABOR, MD Authorized by: Clarine Ozell LABOR, MD     Interpretation: abnormal     ECG rate:  122   ECG rate assessment: tachycardic     Rhythm: atrial fibrillation     Conduction: normal   Comments:     A-fib RVR, rate 122, no gross ST elevation or depression, no significant repolarization abnormality, normal axis, normal levels.  No evidence of ischemia on my interpretation.    MEDICATIONS ORDERED IN ED: Medications  ipratropium-albuterol  (DUONEB) 0.5-2.5 (3) MG/3ML nebulizer solution 3 mL (3 mLs Nebulization Given 10/31/24 1833)  methylPREDNISolone  sodium succinate (SOLU-MEDROL ) 125 mg/2 mL injection 125 mg (125 mg Intravenous Given 10/31/24 1833)  acetaminophen  (TYLENOL )  tablet 1,000 mg (1,000 mg Oral Given 10/31/24 1833)  HYDROmorphone  (DILAUDID ) injection 0.5 mg (0.5 mg Intravenous Given 10/31/24 1834)  sodium chloride  0.9 % bolus 500 mL (0 mLs Intravenous Stopped 10/31/24 1912)  ondansetron  (ZOFRAN ) injection 4 mg (4 mg Intravenous Given 10/31/24 1942)  HYDROmorphone  (DILAUDID ) injection 1 mg (1 mg Intravenous Given 10/31/24 2013)  alum & mag hydroxide-simeth (MAALOX/MYLANTA) 200-200-20 MG/5ML suspension 30 mL (30 mLs Oral Given 10/31/24 2011)    And  lidocaine  (XYLOCAINE ) 2 % viscous mouth solution 15 mL (15 mLs Oral Given 10/31/24 2013)  pantoprazole  (PROTONIX ) injection 40 mg (40 mg Intravenous Given 10/31/24 2013)  metoprolol  tartrate (LOPRESSOR ) tablet 25 mg (25 mg Oral Given 10/31/24 2236)  ondansetron  (ZOFRAN ) injection 4 mg (4 mg Intravenous Given 10/31/24 2241)     IMPRESSION / MDM / ASSESSMENT AND PLAN / ED COURSE  I reviewed the triage vital signs and the nursing notes.                              DDX/MDM/AP: Differential diagnosis includes, but is not limited to, COPD exacerbation, consider ACS, doubt PE in this anticoagulated patient, doubt pneumothorax, do not clinically suspect aortic dissection.  Presentation not consistent with CHF exacerbation.  Considered anxiety and chronic pain contributing.  Plan: - Labs - DuoNeb, methylprednisolone  - Pain control -Small bolus IV fluid, can consider IV rate control medication if tachycardia not improving with fluid  Patient's presentation is most consistent with acute presentation with potential threat to life or bodily function.  The patient is on the cardiac monitor to evaluate for evidence of arrhythmia and/or significant heart rate changes.  ED course below.  Patient with very mild troponin elevation that is stable on repeat.  Nonischemic EKG.  Breathing much better after initial steroids and round of DuoNeb.  Did complain of some persistent chest pain here but has a history of chronic  chest pain.  Rate controlled, loaded with oral metoprolol .  Given multiple problems today including COPD exacerbation, ongoing chest pain, A-fib RVR I do believe is reasonable to observe patient in the hospital --plan for admission to hospitalist service.  Clinical Course as of 10/31/24 2355  Tue Oct 31, 2024  1924 CBC reviewed, unremarkable [MM]  1924 CXR: IMPRESSION: 1. No acute cardiopulmonary pathology. 2. Postoperative changes in the mediastinum.   [MM]  1930 CMP reviewed, unremarkable, lipase normal [MM]  1930 proBNP normal for age [MM]  1930 Troponin very mildly elevated [MM]  1943 Viral swab neg [MM]  2132 Rpt trop stable [MM]    Clinical Course User Index [MM] Clarine Ozell LABOR, MD     FINAL CLINICAL IMPRESSION(S) / ED DIAGNOSES   Final diagnoses:  COPD exacerbation (HCC)  Chest pain,  unspecified type  Atrial fibrillation with RVR (HCC)     Rx / DC Orders   ED Discharge Orders          Ordered    predniSONE  (DELTASONE ) 20 MG tablet  Daily with breakfast        10/31/24 2349             Note:  This document was prepared using Dragon voice recognition software and may include unintentional dictation errors.   Clarine Ozell LABOR, MD 10/31/24 [phone]

## 2024-10-31 NOTE — Patient Outreach (Addendum)
 Social Drivers of Health  Community Resource and Care Coordination Visit Note   10/31/2024  Name: Lawrence [name] MRN: [id number] DOB:01-30-1956  Situation: Referral received for [facility] needs assessment and assistance related to Transportation. I obtained verbal consent from Patient.  Visit completed with Patient on the phone.   Background:      Assessment:   Goals Addressed             This Visit's Progress    BSW Goal       Current SDOH Barriers:  Transportation  Interventions: Provided patient with information about [employer]. SW contacted [employer] [phone] to inquire if they covered [facility]. Lawrence [name] will contact SW back to inform if they cover [place] or just [employer]. Call received from Kevin and they do cover [facility].  Thersia Hoar, HEDWIG, MHA [place]  [facility] Social Worker, Population Health [phone]           Recommendation:   call for transportation assistance at least one week before appointments Continue to work with DSS to get Medicaid started back up.  Follow Up Plan:   Telephone follow-up 11/02/24 at 1130  Thersia Hoar, [place], [place] [facility]  Value Based [facility] Social Worker, Population Health [phone]

## 2024-10-31 NOTE — ED Notes (Signed)
 Called CCMD to initiate cardiac monitoring.

## 2024-10-31 NOTE — Patient Instructions (Signed)
 Visit Information  Thank you for taking time to visit with me today. Please don't hesitate to contact me if I can be of assistance to you before our next scheduled telephone appointment.  Our next appointment is by telephone on 11/07/24 at 10:00 AM  Following is a copy of your care plan:   Goals Addressed             This Visit's Progress    VBCI Transitions of Care (TOC) Care Plan       Problems:  Recent Hospitalization for treatment of COPD Knowledge Deficit Related to post hospital care COPD exacerbation, No Hospital Follow Up Provider appointment patient desires to make own appointments, and SDOH barrier: Patient currently declines any needs 10/31/24 Patient states his transportation needs for appointments that he lost his Medicaid until he gets information back to DSS by 11/06/24 Goal:  Over the next 30 days, the patient will not experience hospital readmission  Interventions:   COPD Interventions: Advised patient to engage in light exercise as tolerated 3-5 days a week to aid in the the management of COPD Advised patient to track and manage COPD triggers Advised patient to self assesses COPD action plan zone and make appointment with provider if in the yellow zone for 48 hours without improvement Assessed social determinant of health barriers Discussed the importance of adequate rest and management of fatigue with COPD Provided education about and advised patient to utilize infection prevention strategies to reduce risk of respiratory infection Screening for signs and symptoms of depression related to chronic disease state  Use of home oxygen   Patient Self Care Activities:  Attend all scheduled provider appointments Call pharmacy for medication refills 3-7 days in advance of running out of medications Call provider office for new concerns or questions  Notify RN Care Manager of TOC call rescheduling needs Participate in Transition of Care Program/Attend TOC scheduled  calls Take medications as prescribed    Plan:  The patient has been provided with contact information for the care management team and has been advised to call with any health related questions or concerns.   TOC Consent to [facility] Information   Following recent discharge, the patient has enrolled in the Transition of Care [facility]) Program and requested that post-discharge communication be directed to a designated support person. The patient has consented for the [facility] team to share information with the individual(s) named below to support care coordination, medication review, and follow-up planning.  Authorized Individual(s): - Name:  Stillman Buenger - Relationship to Patient:  grandson - Contact Info: [phone] Patient verbalized understanding of the consent and confirmed their request for the [facility] team to communicate with the above-named individual(s) for the duration of the [facility] program enrollment. Patient was informed that this consent may be revoked at any time.   10/23/24 Discussed and offered 30 day TOC program.  Patient agrees to weekly calls in 30 day program.  The patient has been provided with contact information for the care management team and has been advised to call with any health -related questions or concerns.  The patient verbalized understanding with current plan of care.  The patient is directed to their insurance card regarding availability of benefits coverage.  Discussion with patient regarding follow up with counseling and states he's awaiting a call with referral made in November. Offered LCSW follow up and patient agrees, that might help till I get to the counselor.  Denies any immediate needs today. Will make referral.  The patient verbalized understanding of instructions, educational materials, and care plan provided today and DECLINED offer to receive copy of patient instructions, educational materials, and care plan.   Telephone follow up appointment  with care management team member scheduled for: The patient has been provided with contact information for the care management team and has been advised to call with any health related questions or concerns.  The patient will call PCP* as advised to arrange transportation with [facility] by calling benefits for follow up appointment.  Follow up with provider re: BP being high on today.  Please call the care guide team at [phone] if you need to cancel or reschedule your appointment.   Please call the Suicide and Crisis Lifeline: 988 call the [place]  National Suicide Prevention Lifeline: 587 349 2525 or TTY: (740)755-9992 TTY 732-493-5581) to talk to a trained counselor call 1-800-273-TALK (toll free, 24 hour hotline) call 911 if you are experiencing a Mental Health or Behavioral Health Crisis or need someone to talk to.  Richerd Fish, RN, BSN, CCM [facility] Management Coordinator Direct Dial: [phone]

## 2024-10-31 NOTE — Discharge Instructions (Addendum)
 Your evaluation in the emergency department was notable for mild COPD exacerbation.  Your chest pain workup was overall reassuring, and your symptoms improved with treatment.  I do recommend you follow-up closely with your cardiologist for reevaluation, and I prescribed you a short course of steroids to help with your breathing.  You should also use your albuterol  inhaler or nebulizer at home for any shortness of breath.

## 2024-10-31 NOTE — ED Triage Notes (Signed)
 Patient arrives from via [facility] with chief complaint of chest pain. Patient states CP that radiates to the shoulder which came on at rest around 1600 today. Patient states he took 4 baby Aspirin , 3 Nitroglycerin , and an Oxycodone . Patient states he still had no relief from pain after medication administration. Patient has history of Afib and open heart surgery in 2002. MD Mian at bedside.

## 2024-11-01 DIAGNOSIS — E119 Type 2 diabetes mellitus without complications: Secondary | ICD-10-CM

## 2024-11-01 DIAGNOSIS — R079 Chest pain, unspecified: Secondary | ICD-10-CM | POA: Diagnosis not present

## 2024-11-01 DIAGNOSIS — I482 Chronic atrial fibrillation, unspecified: Secondary | ICD-10-CM

## 2024-11-01 DIAGNOSIS — J441 Chronic obstructive pulmonary disease with (acute) exacerbation: Secondary | ICD-10-CM

## 2024-11-01 LAB — CBG MONITORING, ED
Glucose-Capillary: 174 mg/dL — ABNORMAL HIGH (ref 70–99)
Glucose-Capillary: 166 mg/dL — ABNORMAL HIGH (ref 70–99)
Glucose-Capillary: 249 mg/dL — ABNORMAL HIGH (ref 70–99)
Glucose-Capillary: 219 mg/dL — ABNORMAL HIGH (ref 70–99)
Glucose-Capillary: 175 mg/dL — ABNORMAL HIGH (ref 70–99)

## 2024-11-01 MED ORDER — ONDANSETRON HCL 4 MG/2ML IJ SOLN: 4.0000 mg | Freq: Four times a day (QID) | INTRAMUSCULAR | Status: DC | PRN

## 2024-11-01 MED ORDER — ASPIRIN 81 MG PO TBEC
81.0000 mg | DELAYED_RELEASE_TABLET | Freq: Every day | ORAL | Status: DC
  Administered 2024-11-01 – 2024-11-02 (×2): 81 mg via ORAL
  Filled 2024-11-01 (×2): qty 1

## 2024-11-01 MED ORDER — METHYLPREDNISOLONE SODIUM SUCC 40 MG IJ SOLR
40.0000 mg | Freq: Two times a day (BID) | INTRAMUSCULAR | Status: AC
  Administered 2024-11-01 (×2): 40 mg via INTRAVENOUS
  Filled 2024-11-01 (×2): qty 1

## 2024-11-01 MED ORDER — SACUBITRIL-VALSARTAN 49-51 MG PO TABS
1.0000 | ORAL_TABLET | Freq: Two times a day (BID) | ORAL | Status: DC
  Administered 2024-11-01 – 2024-11-02 (×3): 1 via ORAL
  Filled 2024-11-01 (×3): qty 1

## 2024-11-01 MED ORDER — ALPRAZOLAM 0.5 MG PO TABS
1.0000 mg | ORAL_TABLET | Freq: Three times a day (TID) | ORAL | Status: DC | PRN
  Administered 2024-11-01 – 2024-11-02 (×4): 1 mg via ORAL
  Filled 2024-11-01 (×4): qty 2

## 2024-11-01 MED ORDER — OXYCODONE HCL 5 MG PO TABS
5.0000 mg | ORAL_TABLET | ORAL | Status: DC | PRN
  Administered 2024-11-01 – 2024-11-02 (×6): 5 mg via ORAL
  Filled 2024-11-01 (×6): qty 1

## 2024-11-01 MED ORDER — MORPHINE SULFATE (PF) 2 MG/ML IV SOLN
2.0000 mg | INTRAVENOUS | Status: AC | PRN
  Administered 2024-11-01 (×2): 2 mg via INTRAVENOUS
  Filled 2024-11-01 (×2): qty 1

## 2024-11-01 MED ORDER — OMEGA-3-ACID ETHYL ESTERS 1 G PO CAPS
1.0000 g | ORAL_CAPSULE | Freq: Every day | ORAL | Status: DC
  Administered 2024-11-01 – 2024-11-02 (×2): 1 g via ORAL
  Filled 2024-11-01 (×2): qty 1

## 2024-11-01 MED ORDER — POLYSACCHARIDE IRON COMPLEX 150 MG PO CAPS
150.0000 mg | ORAL_CAPSULE | Freq: Every day | ORAL | Status: DC
  Administered 2024-11-01 – 2024-11-02 (×2): 150 mg via ORAL
  Filled 2024-11-01 (×2): qty 1

## 2024-11-01 MED ORDER — METHOCARBAMOL 500 MG PO TABS: 500.0000 mg | ORAL_TABLET | Freq: Three times a day (TID) | ORAL | Status: DC | PRN

## 2024-11-01 MED ORDER — VITAMIN D 25 MCG (1000 UNIT) PO TABS
1000.0000 [IU] | ORAL_TABLET | Freq: Every day | ORAL | Status: DC
  Administered 2024-11-01 – 2024-11-02 (×2): 1000 [IU] via ORAL
  Filled 2024-11-01 (×2): qty 1

## 2024-11-01 MED ORDER — METOPROLOL SUCCINATE ER 25 MG PO TB24
37.5000 mg | ORAL_TABLET | Freq: Every day | ORAL | Status: DC
  Administered 2024-11-01 – 2024-11-02 (×2): 37.5 mg via ORAL
  Filled 2024-11-01 (×2): qty 2

## 2024-11-01 MED ORDER — OXYCODONE-ACETAMINOPHEN 10-325 MG PO TABS: 1.0000 | ORAL_TABLET | ORAL | Status: DC | PRN

## 2024-11-01 MED ORDER — SODIUM CHLORIDE 0.9 % IV SOLN: INTRAVENOUS | Status: DC

## 2024-11-01 MED ORDER — LORATADINE 10 MG PO TABS
10.0000 mg | ORAL_TABLET | Freq: Every day | ORAL | Status: DC
  Administered 2024-11-01 – 2024-11-02 (×2): 10 mg via ORAL
  Filled 2024-11-01 (×2): qty 1

## 2024-11-01 MED ORDER — IPRATROPIUM-ALBUTEROL 0.5-2.5 (3) MG/3ML IN SOLN
3.0000 mL | Freq: Four times a day (QID) | RESPIRATORY_TRACT | Status: DC
  Administered 2024-11-01 – 2024-11-02 (×6): 3 mL via RESPIRATORY_TRACT
  Filled 2024-11-01 (×6): qty 3

## 2024-11-01 MED ORDER — ALBUTEROL SULFATE (2.5 MG/3ML) 0.083% IN NEBU: 2.5000 mg | INHALATION_SOLUTION | Freq: Four times a day (QID) | RESPIRATORY_TRACT | Status: DC | PRN

## 2024-11-01 MED ORDER — GUAIFENESIN ER 600 MG PO TB12
600.0000 mg | ORAL_TABLET | Freq: Two times a day (BID) | ORAL | Status: DC
  Administered 2024-11-01 – 2024-11-02 (×4): 600 mg via ORAL
  Filled 2024-11-01 (×4): qty 1

## 2024-11-01 MED ORDER — PREDNISONE 20 MG PO TABS
40.0000 mg | ORAL_TABLET | Freq: Every day | ORAL | Status: DC
  Administered 2024-11-02: 09:00:00 40 mg via ORAL
  Filled 2024-11-01: qty 2

## 2024-11-01 MED ORDER — ACETAMINOPHEN 325 MG PO TABS
650.0000 mg | ORAL_TABLET | ORAL | Status: DC | PRN
  Administered 2024-11-01: 04:00:00 650 mg via ORAL
  Filled 2024-11-01: qty 2

## 2024-11-01 MED ORDER — ATORVASTATIN CALCIUM 20 MG PO TABS
80.0000 mg | ORAL_TABLET | Freq: Every day | ORAL | Status: DC
  Administered 2024-11-01 (×2): 80 mg via ORAL
  Filled 2024-11-01 (×2): qty 4

## 2024-11-01 MED ORDER — OXYCODONE-ACETAMINOPHEN 5-325 MG PO TABS
1.0000 | ORAL_TABLET | ORAL | Status: DC | PRN
  Administered 2024-11-01 – 2024-11-02 (×6): 1 via ORAL
  Filled 2024-11-01 (×6): qty 1

## 2024-11-01 MED ORDER — SODIUM CHLORIDE 0.9 % IV SOLN
1.0000 g | INTRAVENOUS | Status: DC
  Administered 2024-11-01 – 2024-11-02 (×2): 1 g via INTRAVENOUS
  Filled 2024-11-01 (×2): qty 10

## 2024-11-01 MED ORDER — INSULIN ASPART 100 UNIT/ML IJ SOLN: 0.0000 [IU] | Freq: Every day | INTRAMUSCULAR | Status: DC

## 2024-11-01 MED ORDER — TRAZODONE HCL 50 MG PO TABS: 150.0000 mg | ORAL_TABLET | Freq: Every evening | ORAL | Status: DC | PRN

## 2024-11-01 MED ORDER — DAPAGLIFLOZIN PROPANEDIOL 10 MG PO TABS
10.0000 mg | ORAL_TABLET | Freq: Every day | ORAL | Status: DC
  Administered 2024-11-01 – 2024-11-02 (×2): 10 mg via ORAL
  Filled 2024-11-01 (×2): qty 1

## 2024-11-01 MED ORDER — VITAMIN B-12 1000 MCG PO TABS
1000.0000 ug | ORAL_TABLET | Freq: Every day | ORAL | Status: DC
  Administered 2024-11-01 – 2024-11-02 (×2): 1000 ug via ORAL
  Filled 2024-11-01 (×2): qty 1

## 2024-11-01 MED ORDER — NITROGLYCERIN 0.4 MG SL SUBL: 0.4000 mg | SUBLINGUAL_TABLET | SUBLINGUAL | Status: DC | PRN

## 2024-11-01 MED ORDER — HYDROCOD POLI-CHLORPHE POLI ER 10-8 MG/5ML PO SUER
5.0000 mL | Freq: Two times a day (BID) | ORAL | Status: DC | PRN
  Administered 2024-11-02: 01:00:00 5 mL via ORAL
  Filled 2024-11-01: qty 5

## 2024-11-01 MED ORDER — PANTOPRAZOLE SODIUM 40 MG PO TBEC
40.0000 mg | DELAYED_RELEASE_TABLET | Freq: Two times a day (BID) | ORAL | Status: DC
  Administered 2024-11-01 – 2024-11-02 (×3): 40 mg via ORAL
  Filled 2024-11-01 (×3): qty 1

## 2024-11-01 MED ORDER — MAGNESIUM HYDROXIDE 400 MG/5ML PO SUSP: 30.0000 mL | Freq: Every day | ORAL | Status: DC | PRN

## 2024-11-01 MED ORDER — APIXABAN 5 MG PO TABS
5.0000 mg | ORAL_TABLET | Freq: Two times a day (BID) | ORAL | Status: DC
  Administered 2024-11-01 – 2024-11-02 (×3): 5 mg via ORAL
  Filled 2024-11-01 (×3): qty 1

## 2024-11-01 MED ADMIN — Ranolazine Tab ER 12HR 500 MG: 1000 mg | ORAL | @ 10:00:00 | NDC 60687054911

## 2024-11-01 MED ADMIN — Ranolazine Tab ER 12HR 500 MG: 1000 mg | ORAL | @ 22:00:00 | NDC 60687054911

## 2024-11-01 MED ADMIN — Arformoterol Tartrate Soln Nebu 15 MCG/2ML (Base Equiv): 15 ug | RESPIRATORY_TRACT | @ 20:00:00 | NDC 70748017530

## 2024-11-01 MED ADMIN — Methocarbamol Tab 500 MG: 500 mg | ORAL | @ 22:00:00 | NDC 60687055911

## 2024-11-01 MED ADMIN — Insulin Aspart Inj Soln 100 Unit/ML: 5 [IU] | SUBCUTANEOUS | @ 17:00:00 | NDC 73070010011

## 2024-11-01 MED ADMIN — Insulin Aspart Inj Soln 100 Unit/ML: 5 [IU] | SUBCUTANEOUS | @ 11:00:00 | NDC 73070010011

## 2024-11-01 MED ADMIN — Allopurinol Tab 300 MG: 300 mg | ORAL | @ 10:00:00 | NDC 51079020601

## 2024-11-01 MED ADMIN — Methocarbamol Tab 500 MG: 500 mg | ORAL | @ 16:00:00 | NDC 70010075405

## 2024-11-01 MED ADMIN — Arformoterol Tartrate Soln Nebu 15 MCG/2ML (Base Equiv): 15 ug | RESPIRATORY_TRACT | @ 10:00:00 | NDC 70748017530

## 2024-11-01 MED ADMIN — Famotidine Tab 20 MG: 20 mg | ORAL | @ 10:00:00 | NDC 00904719361

## 2024-11-01 MED ADMIN — Insulin Aspart Inj Soln 100 Unit/ML: 3 [IU] | SUBCUTANEOUS | @ 08:00:00 | NDC 73070010011

## 2024-11-01 MED ADMIN — Famotidine Tab 20 MG: 20 mg | ORAL | @ 22:00:00 | NDC 00904719361

## 2024-11-01 MED ADMIN — Methocarbamol Tab 500 MG: 500 mg | ORAL | @ 10:00:00 | NDC 70010075405

## 2024-11-01 MED ADMIN — Allopurinol Tab 300 MG: 300 mg | ORAL | @ 22:00:00 | NDC 51079020601

## 2024-11-01 MED FILL — Arformoterol Tartrate Soln Nebu 15 MCG/2ML (Base Equiv): 15.0000 ug | RESPIRATORY_TRACT | Qty: 2 | Status: AC

## 2024-11-01 MED FILL — Ranolazine Tab ER 12HR 500 MG: 1000.0000 mg | ORAL | Qty: 2 | Status: AC

## 2024-11-01 MED FILL — Methocarbamol Tab 500 MG: 500.0000 mg | ORAL | Qty: 1 | Status: AC

## 2024-11-01 MED FILL — Allopurinol Tab 300 MG: 300.0000 mg | ORAL | Qty: 1 | Status: AC

## 2024-11-01 MED FILL — Insulin Aspart Inj Soln 100 Unit/ML: 0.0000 [IU] | INTRAMUSCULAR | Qty: 5 | Status: AC

## 2024-11-01 MED FILL — Famotidine Tab 20 MG: 20.0000 mg | ORAL | Qty: 1 | Status: AC

## 2024-11-01 MED FILL — Insulin Aspart Inj Soln 100 Unit/ML: 0.0000 [IU] | INTRAMUSCULAR | Qty: 2 | Status: AC

## 2024-11-01 MED FILL — Insulin Aspart Inj Soln 100 Unit/ML: 0.0000 [IU] | INTRAMUSCULAR | Qty: 3 | Status: AC

## 2024-11-01 NOTE — Evaluation (Addendum)
 Physical Therapy Evaluation Patient Details Name: KOTA CIANCIO MRN: [id number] DOB: 11-Jan-1956 Today's Date: 11/01/2024  History of Present Illness  Jameel Quant is a 68 y.o. male who presented to ED on 10/31/24 with c/o chest pain radiating into shoulder. PMH significant for COPD on 2L chronic, CHF, CAD with hx of CABG ande stent 2016, HLD, Afib, HTN, DM.  Clinical Impression  PT/OT cotx on this date. Pt received in ED bed and agreeable to therapy. Pt required MIN A for trunk elevation for supine>sit but otherwise bed mobility is supervision. Able to perform STS to RW with supervision with transient dizziness upon position change--orthostatics negative. Ambulated 30 ft with 2-3 intermittent standing rest breaks to improve O2 sat. O2 dropped to 80%'s with amb but able to recover to 90%'s with cuing for PLB and rest. Pt returned to bed with all needs met. He will continue to benefit from skilled PT services to improve general strength and activity tolerance following his hospital stay.        If plan is discharge home, recommend the following: A little help with walking and/or transfers;A little help with bathing/dressing/bathroom;Assistance with cooking/housework;Assist for transportation;Help with stairs or ramp for entrance   Can travel by private vehicle        Equipment Recommendations None recommended by PT  Recommendations for Other Services       Functional Status Assessment Patient has had a recent decline in their functional status and demonstrates the ability to make significant improvements in function in a reasonable and predictable amount of time.     Precautions / Restrictions Precautions Precautions: Fall Recall of Precautions/Restrictions: Intact Restrictions Weight Bearing Restrictions Per Provider Order: No      Mobility  Bed Mobility Overal bed mobility: Needs Assistance Bed Mobility: Sit to Supine, Supine to Sit     Supine to sit: Min assist Sit to  supine: Supervision   General bed mobility comments: MIN A for trunk elevation for supine>sit    Transfers Overall transfer level: Needs assistance Equipment used: Rolling walker (2 wheels) Transfers: Sit to/from Stand Sit to Stand: Supervision           General transfer comment: no vc for hand placement needed    Ambulation/Gait Ambulation/Gait assistance: Supervision, Contact guard assist Gait Distance (Feet): 30 Feet Assistive device: Rolling walker (2 wheels) Gait Pattern/deviations: Step-through pattern, Decreased stride length, Trunk flexed       General Gait Details: 2-3 standing rest breaks at RW d/t need to catch his breath  Stairs            Wheelchair Mobility     Tilt Bed    Modified Rankin (Stroke Patients Only)       Balance Overall balance assessment: Needs assistance Sitting-balance support: No upper extremity supported, Feet unsupported Sitting balance-Leahy Scale: Good     Standing balance support: Bilateral upper extremity supported Standing balance-Leahy Scale: Fair Standing balance comment: reliant on RW for standing balance                             Pertinent Vitals/Pain Pain Assessment Pain Assessment: 0-10 Pain Score: 8  Pain Location: chest pain and BLE Pain Descriptors / Indicators: Grimacing, Discomfort Pain Intervention(s): Monitored during session, Patient requesting pain meds-RN notified, Limited activity within patient's tolerance    Home Living Family/patient expects to be discharged to:: Private residence Living Arrangements: Other relatives Available Help at Discharge: Family;Available 24 hours/day Type of  Home: Apartment Home Access: Level entry       Home Layout: One level Home Equipment: Shower seat - built in;Rollator (4 wheels);BSC/3in1;Hospital bed;Grab bars - toilet;Grab bars - tub/shower      Prior Function Prior Level of Function : Independent/Modified Independent;History of Falls  (last six months)             Mobility Comments: Modified independent ambulating with rollator.  2 L O2 at night (PRN during the day) ADLs Comments: Grandson assists with IADL's     Extremity/Trunk Assessment   Upper Extremity Assessment Upper Extremity Assessment: Overall WFL for tasks assessed    Lower Extremity Assessment Lower Extremity Assessment: Generalized weakness    Cervical / Trunk Assessment Cervical / Trunk Assessment: Kyphotic  Communication   Communication Communication: No apparent difficulties    Cognition Arousal: Alert Behavior During Therapy: WFL for tasks assessed/performed   PT - Cognitive impairments: No apparent impairments                         Following commands: Intact       Cueing Cueing Techniques: Verbal cues     General Comments General comments (skin integrity, edema, etc.): seated BP: 130/95 (107); standing BP: 124/76 (91) with transiet dizziness upon position change. SpO2 desat to 80%'s but quick recovery with PLB    Exercises     Assessment/Plan    PT Assessment Patient needs continued PT services  PT Problem List Decreased strength;Decreased activity tolerance;Decreased balance;Decreased mobility;Cardiopulmonary status limiting activity       PT Treatment Interventions DME instruction;Gait training;Functional mobility training;Therapeutic activities;Therapeutic exercise;Balance training;Neuromuscular re-education;Patient/family education    PT Goals (Current goals can be found in the Care Plan section)  Acute Rehab PT Goals Patient Stated Goal: to get better PT Goal Formulation: With patient Time For Goal Achievement: 11/15/24 Potential to Achieve Goals: Good    Frequency Min 1X/week     Co-evaluation PT/OT/SLP Co-Evaluation/Treatment: Yes Reason for Co-Treatment: To address functional/ADL transfers PT goals addressed during session: Mobility/safety with mobility OT goals addressed during session:  ADL's and self-care       AM-PAC PT 6 Clicks Mobility  Outcome Measure Help needed turning from your back to your side while in a flat bed without using bedrails?: A Little Help needed moving from lying on your back to sitting on the side of a flat bed without using bedrails?: A Little Help needed moving to and from a bed to a chair (including a wheelchair)?: A Little Help needed standing up from a chair using your arms (e.g., wheelchair or bedside chair)?: None Help needed to walk in hospital room?: None Help needed climbing 3-5 steps with a railing? : A Little 6 Click Score: 20    End of Session Equipment Utilized During Treatment: Gait belt;Oxygen  Activity Tolerance: Patient tolerated treatment well Patient left: in bed;with call bell/phone within reach Nurse Communication: Mobility status PT Visit Diagnosis: Muscle weakness (generalized) (M62.81);Unsteadiness on feet (R26.81)    Time: 9097-9083 PT Time Calculation (min) (ACUTE ONLY): 14 min   Charges:                 Allena Bulls, SPT  Maryanne Finder, PT, DPT Physical Therapist - [facility]  [facility] 11/01/2024, 10:42 AM

## 2024-11-01 NOTE — ED Notes (Signed)
Physical therapy with pt.

## 2024-11-01 NOTE — ED Notes (Signed)
 As a medical pt that is low suicide risk, pt will remain on monitoring and in a medical room

## 2024-11-01 NOTE — ED Notes (Signed)
 Pt reports he is having 9/10 pain to his kidneys and heart. Pt upset regarding only being offered his home dose pain medication which he takes for his back pain. States that does nothing for this pain. Message sent to overnight provider.

## 2024-11-01 NOTE — Progress Notes (Signed)
 Brief hospitalist update note.  This is a nonbillable note.  Please see same-day H&P for full billable details.  Briefly, this is a 68 year old Male with extensive cardiac history, recurrent admissions for chest pain who presents on 1217 for chief complaint of chest pain.  Troponins trended and flat x 2.  Suspect that chest pain is more related to underlying lung physiology as patient does have known COPD.  Wheezing noted on arrival.  I examined patient after breathing treatment with a improvement in breath sounds.  Patient is still requiring supplemental oxygen  however he does have a home requirement.  Plan to keep in observation status.  Optimize COPD treatment.  Monitor on telemetry.  If patient remains stable and chest pain improved anticipate discharge 12/18.  Calvin Robson MD  No charge

## 2024-11-01 NOTE — Assessment & Plan Note (Signed)
-   The patient will be admitted to an observation cardiac telemetry bed. - Will follow serial troponins and EKGs. - The patient will be placed on aspirin  as well as p.r.n. sublingual nitroglycerin  and morphine  sulfate for pain. -This could certainly be associated with COPD exacerbation

## 2024-11-01 NOTE — Evaluation (Signed)
 Occupational Therapy Evaluation Patient Details Name: Lawrence [name] MRN: [id number] DOB: [birth year] Today's Date: 11/01/2024   History of Present Illness   Lawrence [name] is a 68 y.o. male who presented to ED on 10/31/24 with c/o chest pain radiating into shoulder. PMH significant for COPD on 2L chronic, CHF, CAD with hx of CABG ande stent 2016, HLD, Afib, HTN, DM.    Clinical Impressions [name] was seen for OT evaluation this date. Prior to hospital admission, pt was MOD I using rollator for household distances. Pt lives with grandson in ground floor apartment. Pt currently requires CGA + RW for ADL t/f ~40 ft, self-selects 1 standing rest break due to fatigue/shortness of breath. SpO2 99% at rest on 2L [place], desat 84% with activity (questionable pleth), resolved with rest break.  Pt would benefit from skilled OT to address noted impairments and functional limitations (see below for any additional details). Upon hospital discharge, recommend OT follow up.    If plan is discharge home, recommend the following:   A little help with walking and/or transfers;A little help with bathing/dressing/bathroom;Assistance with cooking/housework;Assist for transportation     Functional Status Assessment   Patient has had a recent decline in their functional status and demonstrates the ability to make significant improvements in function in a reasonable and predictable amount of time.     Equipment Recommendations   None recommended by OT     Recommendations for Other Services         Precautions/Restrictions   Precautions Precautions: Fall Recall of Precautions/Restrictions: Intact Restrictions Weight Bearing Restrictions Per Provider Order: No     Mobility Bed Mobility Overal bed mobility: Needs Assistance Bed Mobility: Sit to Supine, Supine to Sit     Supine to sit: Contact guard Sit to supine: Supervision        Transfers Overall transfer level: Needs  assistance Equipment used: Rolling walker (2 wheels) Transfers: Sit to/from Stand Sit to Stand: Supervision           General transfer comment: SBA with good hand placement      Balance Overall balance assessment: Needs assistance Sitting-balance support: No upper extremity supported, Feet unsupported Sitting balance-Leahy Scale: Good     Standing balance support: Bilateral upper extremity supported Standing balance-Leahy Scale: Fair                             ADL either performed or assessed with clinical judgement   ADL Overall ADL's : Needs assistance/impaired                                       General ADL Comments: CGA + RW for ADL t/f ~40 ft, self-selects 1 standing rest break due to fatigue/O2 desat.      Pertinent Vitals/Pain Pain Assessment Pain Assessment: 0-10 Pain Score: 8  Pain Location: chest pain and BLE Pain Descriptors / Indicators: Grimacing, Discomfort Pain Intervention(s): Limited activity within patient's tolerance, Patient requesting pain meds-RN notified     Extremity/Trunk Assessment Upper Extremity Assessment Upper Extremity Assessment: Overall WFL for tasks assessed   Lower Extremity Assessment Lower Extremity Assessment: Generalized weakness       Communication Communication Communication: No apparent difficulties   Cognition Arousal: Alert Behavior During Therapy: WFL for tasks assessed/performed Cognition: History of cognitive impairments  OT - Cognition Comments: alert and oriented x4                 Following commands: Intact       Cueing  General Comments   Cueing Techniques: Verbal cues      Exercises     Shoulder Instructions      Home Living Family/patient expects to be discharged to:: Private residence Living Arrangements: Other relatives (grandson) Available Help at Discharge: Family;Available 24 hours/day Type of Home: Apartment Home Access: Level  entry     Home Layout: One level     Bathroom Shower/Tub: Walk-in shower         Home Equipment: Shower seat - built in;Rollator (4 wheels);BSC/3in1;Hospital bed;Grab bars - toilet;Grab bars - tub/shower          Prior Functioning/Environment Prior Level of Function : Independent/Modified Independent;History of Falls (last six months)             Mobility Comments: Modified independent ambulating with rollator.  2 L O2 at night (PRN during the day) ADLs Comments: Grandson assists with IADL's    OT Problem List: Decreased strength;Decreased activity tolerance;Impaired balance (sitting and/or standing)   OT Treatment/Interventions: Self-care/ADL training;Therapeutic exercise;Energy conservation;Therapeutic activities;Balance training;Patient/family education      OT Goals(Current goals can be found in the care plan section)   Acute Rehab OT Goals Patient Stated Goal: to go home OT Goal Formulation: With patient Time For Goal Achievement: 11/15/24 Potential to Achieve Goals: Good ADL Goals Pt Will Perform Grooming: with modified independence;standing Pt Will Perform Lower Body Dressing: with modified independence;sit to/from stand Pt Will Transfer to Toilet: with modified independence;ambulating;regular height toilet   OT Frequency:  Min 2X/week    Co-evaluation              AM-PAC OT 6 Clicks Daily Activity     Outcome Measure Help from another person eating meals?: None Help from another person taking care of personal grooming?: A Little Help from another person toileting, which includes using toliet, bedpan, or urinal?: A Little Help from another person bathing (including washing, rinsing, drying)?: A Lot Help from another person to put on and taking off regular upper body clothing?: A Little Help from another person to put on and taking off regular lower body clothing?: A Little 6 Click Score: 18   End of Session Equipment Utilized During  Treatment: Oxygen ;Rolling walker (2 wheels);Gait belt Nurse Communication: Mobility status  Activity Tolerance: Patient tolerated treatment well Patient left: in bed;with call bell/phone within reach  OT Visit Diagnosis: Unsteadiness on feet (R26.81);Muscle weakness (generalized) (M62.81);History of falling (Z91.81)                Time: 9097-9083 OT Time Calculation (min): 14 min Charges:  OT General Charges $OT Visit: 1 Visit OT Evaluation $OT Eval Low Complexity: 1 Low  Elston Slot, M.S. OTR/L  11/01/2024, 9:55 AM  ascom [phone]

## 2024-11-01 NOTE — H&P (Signed)
 [place]   PATIENT NAME: Lawrence [name]    MR#:  [id number]  DATE OF BIRTH:  11/22/1955  DATE OF ADMISSION:  10/31/2024  PRIMARY CARE PHYSICIAN: [name], MD   Patient is coming from: Home  REQUESTING/REFERRING PHYSICIAN: Cyrena Mylar, MD  CHIEF COMPLAINT:   Chief Complaint  Patient presents with   Chest Pain    HISTORY OF PRESENT ILLNESS:  Lawrence [name] is a 68 y.o. male with medical history significant for CAD (s/p of stent x 4/CABG 2016), A-fib on Eliquis , HFrEF (EF 35 to 40%, 07/2024), PSVT/frequent PVCs, HTN,DM, COPD on 2L O2, OSA not using CPAP, multiple prior hospitalizations related to chest pain/CHF/COPD, most recently 12/112 10/21/2024, who presented to the emergency room with acute onset of midsternal chest pain that started about an hour prior to coming to the ER, felt as chest tightness and go to the 8/10 severity chronic improvement to 4/, with associated shortness of breath and palpitations as well as nausea without vomiting.  He has been on steroid taper with prednisone  and took his last dose on Sunday.  He feels since then he has been having increased work of breathing with associated wheezing and chest tightness.  He admits to current worsening cough with expectoration of light yellowish sputum since Sunday.  He denied any worsening lower extremity edema.  No fever or chills but he has been diaphoretic.  No nausea or vomiting or abdominal pain.  No dysuria, oliguria or hematuria or flank pain.  He has been compliant with his medications per his report. . ED Course: When he came to the ER BP was 151/84 with heart rate of 112 with pulse currently of 90% on room air and 99% on 2 L O2 by nasal cannula.  CMP revealed a BUN of 24 and troponin of 6.3 and was otherwise unremarkable.  proBNP was 524.  High sensitive troponin T was 32 and later 31.  CBC was within normal.  Respiratory panel came back negative. EKG as reviewed by me : EKG showed atrial fibrillation  with right upper ventricular sponsor of 122 with inferior Q waves as well as anteroseptal Q waves. Imaging: Portable chest x-ray showed no acute cardiopulmonary disease.  It showed postoperative changes in the mediastinum.  The patient was given 500 mL IV normal saline bolus, 40 mg of IV Protonix , 4 mg of IV Zofran  twice and 25 mg of p.o. Lopressor  and 125 mg of IV Solu-Medrol  as well as DuoNeb, and 1.5 mg of IV Dilaudid , as well as GI cocktail and 1 g of p.o. Tylenol .  He will be admitted to an observation telemetry bed for further evaluation and management. PAST MEDICAL HISTORY:   Past Medical History:  Diagnosis Date   A-fib (HCC)    Anemia    Anginal pain    Anxiety    Arthritis    Asthma    Atrial fibrillation, chronic (HCC) 10/19/2023   CAD (coronary artery disease)    a. 2002 CABGx2 (LIMA->LAD, VG->VG->OM1);  b. 09/2012 DES->OM;  c. 03/2015 PTCA of LAD [facility]) in setting of atretic LIMA; d. 05/2015 Cath [facility]): nonobs dzs; e. 06/2015 Cath ([facility]): LM nl, LAD 45p/d ISR, 50d, D1/2 small, LCX 50p/d ISR, OM1 70ost, 30 ISR, VG->OM1 50ost, 25m, LIMA->LAD 99p/d - atretic, RCA dom, nl; f.cath 10/16: 40-50%(FFR 0.90) pLAD, 75% (FFR 0.77) mLAD s/p PCI/DES, oRCA 40% (FFR0.95)   Cancer (HCC)    SKIN CANCER ON BACK   Celiac disease  Chronic diastolic CHF (congestive heart failure) (HCC)    a. 06/2009 Echo: EF 60-65%, Gr 1 DD, triv AI, mildly dil LA, nl RV.   COPD (chronic obstructive pulmonary disease) (HCC)    a. Chronic bronchitis and emphysema.   DDD (degenerative disc disease), lumbar    Diverticulosis    Dysrhythmia    Essential hypertension    GERD (gastroesophageal reflux disease)    History of hiatal hernia    History of kidney stones    H/O   History of tobacco abuse    a. Quit 2014.   Myocardial infarction [facility]) 2002   4 STENTS   Pancreatitis    PSVT (paroxysmal supraventricular tachycardia)    a. 10/2012 Noted on Zio Patch.   RLL pneumonia 05/30/2024   Sleep apnea    LOST CORD  TO CPAP -ONLY 02 @ BEDTIME   Tubular adenoma of colon    Type II diabetes mellitus (HCC)     PAST SURGICAL HISTORY:   Past Surgical History:  Procedure Laterality Date   BYPASS GRAFT     CARDIAC CATHETERIZATION N/A 07/12/2015   rocedure: Left Heart Cath and Cors/Grafts Angiography;  Surgeon: Victory LELON Sharps, MD;  Location: [facility] INVASIVE CV LAB;  Service: Cardiovascular;  Laterality: N/A;   CARDIAC CATHETERIZATION Right 10/07/2015   Procedure: Left Heart Cath and Cors/Grafts Angiography;  Surgeon: Denyse DELENA Bathe, MD;  Location: [facility] INVASIVE CV LAB;  Service: Cardiovascular;  Laterality: Right;   CARDIAC CATHETERIZATION N/A 04/06/2016   Procedure: Left Heart Cath and Coronary Angiography;  Surgeon: Cara JONETTA Lovelace, MD;  Location: [facility] INVASIVE CV LAB;  Service: Cardiovascular;  Laterality: N/A;   CARDIAC CATHETERIZATION  04/06/2016   Procedure: Bypass Graft Angiography;  Surgeon: Cara JONETTA Lovelace, MD;  Location: [facility] INVASIVE CV LAB;  Service: Cardiovascular;;   CARDIAC CATHETERIZATION N/A 11/02/2016   Procedure: Left Heart Cath and Cors/Grafts Angiography and possible PCI;  Surgeon: Cara JONETTA Lovelace, MD;  Location: [facility] INVASIVE CV LAB;  Service: Cardiovascular;  Laterality: N/A;   CARDIAC CATHETERIZATION N/A 11/02/2016   Procedure: Coronary Stent Intervention;  Surgeon: Cara JONETTA Lovelace, MD;  Location: [facility] INVASIVE CV LAB;  Service: Cardiovascular;  Laterality: N/A;   CARDIOVERSION N/A 06/29/2022   Procedure: CARDIOVERSION;  Surgeon: Hester Wolm PARAS, MD;  Location: [facility] ORS;  Service: Cardiovascular;  Laterality: N/A;   CHOLECYSTECTOMY     CIRCUMCISION N/A 06/09/2019   Procedure: CIRCUMCISION ADULT;  Surgeon: Francisca Redell BROCKS, MD;  Location: [facility] ORS;  Service: Urology;  Laterality: N/A;   COLONOSCOPY WITH PROPOFOL  N/A 04/01/2018   Procedure: COLONOSCOPY WITH PROPOFOL ;  Surgeon: Viktoria Lamar DASEN, MD;  Location: [facility] ENDOSCOPY;  Service: Endoscopy;  Laterality: N/A;   COLONOSCOPY WITH  PROPOFOL  N/A 05/01/2023   Procedure: COLONOSCOPY WITH PROPOFOL ;  Surgeon: Unk Corinn Skiff, MD;  Location: [facility] ENDOSCOPY;  Service: Gastroenterology;  Laterality: N/A;   ESOPHAGEAL DILATION     ESOPHAGOGASTRODUODENOSCOPY (EGD) WITH PROPOFOL  N/A 04/01/2018   Procedure: ESOPHAGOGASTRODUODENOSCOPY (EGD) WITH PROPOFOL ;  Surgeon: Viktoria Lamar DASEN, MD;  Location: [facility] ENDOSCOPY;  Service: Endoscopy;  Laterality: N/A;   ESOPHAGOGASTRODUODENOSCOPY (EGD) WITH PROPOFOL  N/A 05/01/2023   Procedure: ESOPHAGOGASTRODUODENOSCOPY (EGD) WITH PROPOFOL ;  Surgeon: Unk Corinn Skiff, MD;  Location: [facility] ENDOSCOPY;  Service: Gastroenterology;  Laterality: N/A;   GIVENS CAPSULE STUDY  05/01/2023   Procedure: GIVENS CAPSULE STUDY;  Surgeon: Unk Corinn Skiff, MD;  Location[facility] ENDOSCOPY;  Service: Gastroenterology;;   LEFT HEART CATH AND CORS/GRAFTS ANGIOGRAPHY N/A 06/12/2019   Procedure: LEFT HEART  CATH AND CORS/GRAFTS ANGIOGRAPHY;  Surgeon: Bosie Vinie LABOR, MD;  Location: [facility] INVASIVE CV LAB;  Service: Cardiovascular;  Laterality: N/A;   LEFT HEART CATH AND CORS/GRAFTS ANGIOGRAPHY N/A 03/11/2020   Procedure: LEFT HEART CATH AND CORS/GRAFTS ANGIOGRAPHY;  Surgeon: Ammon Blunt, MD;  Location: [facility] INVASIVE CV LAB;  Service: Cardiovascular;  Laterality: N/A;   LEFT HEART CATH AND CORS/GRAFTS ANGIOGRAPHY N/A 05/01/2021   Procedure: LEFT HEART CATH AND CORS/GRAFTS ANGIOGRAPHY;  Surgeon: Hester Wolm PARAS, MD;  Location: [facility] INVASIVE CV LAB;  Service: Cardiovascular;  Laterality: N/A;   LEFT HEART CATH AND CORS/GRAFTS ANGIOGRAPHY N/A 11/02/2022   Procedure: LEFT HEART CATH AND CORS/GRAFTS ANGIOGRAPHY;  Surgeon: Florencio Cara BIRCH, MD;  Location: [facility] INVASIVE CV LAB;  Service: Cardiovascular;  Laterality: N/A;   TONSILLECTOMY     VASCULAR SURGERY      SOCIAL HISTORY:   Social History   Tobacco Use   Smoking status: Former    Current packs/day: 0.00    Average packs/day: 2.0 packs/day for 51.0 years  (102.0 ttl pk-yrs)    Types: Cigarettes    Start date: 04/22/1962    Quit date: 04/22/2013    Years since quitting: 11.5   Smokeless tobacco: Never   Tobacco comments:    Reports not smoking for approx 8 years.  Substance Use Topics   Alcohol use: No    Comment: Hx Alcohol Abuse    FAMILY HISTORY:   Family History  Problem Relation Age of Onset   Heart attack Mother    Depression Mother    Heart disease Mother    COPD Mother    Hypertension Mother    Heart attack Father    Diabetes Father    Depression Father    Heart disease Father    Cirrhosis Father    Parkinson's disease Brother     DRUG ALLERGIES:  Allergies[1]  REVIEW OF SYSTEMS:   ROS As per history of present illness. All pertinent systems were reviewed above. Constitutional, HEENT, cardiovascular, respiratory, GI, GU, musculoskeletal, neuro, psychiatric, endocrine, integumentary and hematologic systems were reviewed and are otherwise negative/unremarkable except for positive findings mentioned above in the HPI.   MEDICATIONS AT HOME:   Prior to Admission medications  Medication Sig Start Date End Date Taking? Authorizing Provider  predniSONE  (DELTASONE ) 20 MG tablet Take 2 tablets (40 mg total) by mouth daily with breakfast for 4 days. 11/01/24 11/05/24 Yes Clarine Ozell LABOR, MD  Accu-Chek Softclix Lancets lancets Use as instructed to check sugar daily for type 2 diabetes. 03/03/21   [name], MD  acetaminophen  (TYLENOL ) 325 MG tablet Take 2 tablets (650 mg total) by mouth every 6 (six) hours as needed for mild pain (pain score 1-3) or fever (or Fever >/= 101). Patient not taking: Reported on 10/31/2024 08/15/24   Dorinda Drue DASEN, MD  albuterol  (PROVENTIL ) (2.5 MG/3ML) 0.083% nebulizer solution Take 3 mLs (2.5 mg total) by nebulization every 6 (six) hours as needed for wheezing or shortness of breath. 05/26/24   [name], MD  allopurinol  (ZYLOPRIM ) 300 MG tablet TAKE 1 TABLET BY MOUTH TWICE A DAY 04/12/24    [name], MD  ALPRAZolam  (XANAX ) 1 MG tablet TAKE 1 TABLET BY MOUTH 3 TIMES A DAY AS NEEDED 09/16/24   [name], MD  apixaban  (ELIQUIS ) 5 MG TABS tablet Take 1 tablet (5 mg total) by mouth 2 (two) times daily. 10/04/23   [name], MD  atorvastatin  (LIPITOR ) 80 MG tablet TAKE  1 TABLET BY MOUTH AT BEDTIME 07/15/23   [name], MD  Blood Glucose Monitoring Suppl (ACCU-CHEK GUIDE) w/Device KIT Use to check blood sugars as directed 12/01/23   [name], MD  Blood Pressure Monitor DEVI Use to check blood pressure daily 07/27/24   [name], MD  BREZTRI  AEROSPHERE 160-9-4.8 MCG/ACT AERO inhaler INHALE 2 PUFFS BY MOUTH INTO THE LUNGS 2TIMES DAILY 06/05/24   [name], MD  cetirizine  (ZYRTEC ) 10 MG tablet Take 1 tablet (10 mg total) by mouth at bedtime. 03/31/24   [name], MD  cholecalciferol  (VITAMIN D3) 25 MCG (1000 UNIT) tablet Take 1,000 Units by mouth daily.    [provider]  Cyanocobalamin  (VITAMIN B-12) 1000 MCG SUBL Place 1 tablet under the tongue daily.    [provider]  dapagliflozin  propanediol (FARXIGA ) 10 MG TABS tablet TAKE 1 TABLET BY MOUTH DAILY 03/28/24   [name], MD  famotidine  (PEPCID ) 20 MG tablet TAKE 1 TABLET BY MOUTH TWICE A DAY 08/23/24   [name], MD  glucose blood (ACCU-CHEK GUIDE) test strip Use as instructed to check sugar daily for type 2 diabetes. 03/03/21   [name], MD  iron  polysaccharides (FERREX 150) 150 MG capsule Take one capsul every Monday, Wednesday, and Friday 06/09/24   [name], MD  metFORMIN  (GLUCOPHAGE -XR) 500 MG 24 hr tablet TAKE 1 TABLET BY MOUTH DAILY WITH EVENING MEAL 07/11/24   [name], MD  methocarbamol  (ROBAXIN ) 500 MG tablet TAKE 1 TABLET BY MOUTH EVERY 8 HOURS AS NEEDED FOR MUSCLE SPASMS 10/23/24   [name], MD  metoprolol  succinate (TOPROL -XL) 25 MG 24 hr tablet Take 1.5 tablets (37.5 mg total) by mouth daily. 10/09/24   Dorinda Drue DASEN, MD  nitroGLYCERIN  (NITROSTAT ) 0.4 MG SL tablet Place 1 tablet (0.4 mg total) under the tongue every 5 (five) minutes as needed for chest pain (Do not take if blood pressure is low, systolic BP<110). 10/21/23   [name], MD  omega-3 acid ethyl esters (LOVAZA ) 1 g capsule TAKE 4 CAPSULES (4 GRAMS TOTAL) BY MOUTHDAILY 03/02/24   [name], MD  oxyCODONE -acetaminophen  (PERCOCET) 10-325 MG tablet TAKE 1 TABLET BY MOUTH EVERY 4 HOURS AS NEEDED FOR PAIN 10/11/24   [name], MD  OXYGEN  Inhale 2 L into the lungs at bedtime as needed (for shortness of breath).    [provider]  pantoprazole  (PROTONIX ) 40 MG tablet TAKE 1 TABLET BY MOUTH 2 TIMES DAILY (30MINUTES BEFORE A MEAL) 01/12/24   [name], MD  ranolazine  (RANEXA ) 1000 MG SR tablet Take 1 tablet (1,000 mg total) by mouth 2 (two) times daily. 08/15/24   Dorinda Drue DASEN, MD  sacubitril -valsartan  (ENTRESTO ) 49-51 MG Take 1 tablet by mouth 2 (two) times daily. 07/27/24   [name], MD  traZODone  (DESYREL ) 150 MG tablet TAKE 1 TABLET BY MOUTH AT BEDTIME AS NEEDED FOR SLEEP. 09/10/24   [name], MD      VITAL SIGNS:  Blood pressure (!) 129/93, pulse 93, temperature (!) 97.5 F (36.4 C), temperature source Oral, resp. rate 15, height 5' 7 (1.702 m), weight 78.9 kg, SpO2 98%.  PHYSICAL EXAMINATION:  Physical Exam  GENERAL:  68 y.o.-year-old patient lying in the bed with mild respiratory distress with conversational dyspnea. EYES: Pupils equal, round, reactive to light and accommodation. No scleral icterus. Extraocular muscles intact.  HEENT: Head atraumatic, normocephalic. Oropharynx and nasopharynx clear.  NECK:  Supple, no jugular venous distention. No thyroid  enlargement, no tenderness.  LUNGS: Diffuse expiratory wheezes with tight expiratory airflow with harsh vesicular breathing. No use of accessory muscles of respiration.  CARDIOVASCULAR: Irregularly irregular mildly tachycardic rhythm, S1, S2  normal. No murmurs, rubs, or gallops.  ABDOMEN: Soft, nondistended, nontender. Bowel sounds present. No organomegaly or mass.  EXTREMITIES: No pedal edema, cyanosis, or clubbing.  NEUROLOGIC: Cranial nerves II through XII are intact. Muscle strength 5/5 in all extremities. Sensation intact. Gait not checked.  PSYCHIATRIC: The patient is alert and oriented x 3.  Normal affect and good eye contact. SKIN: No obvious rash, lesion, or ulcer.   LABORATORY PANEL:   CBC Recent Labs  Lab 10/31/24 1840  WBC 7.7  HGB 17.0  HCT 52.6*  PLT 175   ------------------------------------------------------------------------------------------------------------------  Chemistries  Recent Labs  Lab 10/31/24 1840  NA 140  K 3.8  CL 101  CO2 27  GLUCOSE 82  BUN 24*  CREATININE 1.01  CALCIUM  9.8  AST 19  ALT 22  ALKPHOS 66  BILITOT 0.7   ------------------------------------------------------------------------------------------------------------------  Cardiac Enzymes No results for input(s): TROPONINI in the last 168 hours. ------------------------------------------------------------------------------------------------------------------  RADIOLOGY:  DG Chest Portable 1 View Result Date: 10/31/2024 EXAM: 1 VIEW(S) XRAY OF THE CHEST 10/31/2024 06:30:00 PM COMPARISON: 10/16/2024 CLINICAL HISTORY: chest pain FINDINGS: LUNGS AND PLEURA: No focal pulmonary opacity. No pleural effusion. No pneumothorax. HEART AND MEDIASTINUM: Postoperative changes in the mediastinum. Heart size and pulmonary vascularity are normal. Mediastinal contours appear intact. Calcification of the aorta. BONES AND SOFT TISSUES: No acute osseous abnormality. IMPRESSION: 1. No acute cardiopulmonary pathology. 2. Postoperative changes in the mediastinum. Electronically signed by: Elsie Gravely MD 10/31/2024 06:39 PM EST RP Workstation: [id number]      IMPRESSION AND PLAN:  Assessment and Plan: Chest pain - The patient  will be admitted to an observation cardiac telemetry bed. - Will follow serial troponins and EKGs. - The patient will be placed on aspirin  as well as p.r.n. sublingual nitroglycerin  and morphine  sulfate for pain. -This could certainly be associated with COPD exacerbation    COPD exacerbation (HCC) - The patient will be admitted to a medically monitored bed. - We will place the patient IV steroid therapy with IV Solu-Medrol  as well as nebulized bronchodilator therapy with duonebs q.i.d. and q.4 hours p.r.n.SABRA - Mucolytic therapy will be provided with Mucinex  and antibiotic therapy with IV Rocephin . - O2 protocol will be followed.      Chronic atrial fibrillation with RVR (HCC) - Will continue Eliquis  and Toprol -XL.  Heart rate is currently improving.   GERD without esophagitis - Will continue PPI therapy.   Controlled type 2 diabetes mellitus without complication, without long-term current use of insulin  (HCC) - The patient will be placed on supplement coverage with NovoLog . - Will hold off metformin .   Coronary artery disease - Will continue statin therapy, Ranexa  as well  as beta-blocker therapy with Toprol -XL.  Anxiety and depression - Will continue Xanax  and trazodone .  DVT prophylaxis: Eliquis . Advanced Care Planning:  Code Status: The patient is DNR and DNI. Family Communication:  The plan of care was discussed in details with the patient (and family). I answered all questions. The patient agreed to proceed with the above mentioned plan. Further management will depend upon hospital course. Disposition Plan: Back to previous home environment Consults called: Neurology. All the records are reviewed and case discussed with ED provider.  Status is: Observation  I certify that at the time  of admission, it is my clinical judgment that the patient will require hospital care extending less than 2 midnights.                            Dispo: The patient is from: Home               Anticipated d/c is to: Home              Patient currently is not medically stable to d/c.              Difficult to place patient: No  Madison DELENA Peaches M.D on 11/01/2024 at 1:07 AM  [facility]   From 7 PM-7 AM, contact night-coverage [email or web address]  CC: Primary care physician; [name], MD     [1]  Allergies Allergen Reactions   Demerol  [Meperidine Hcl]    Demerol [Meperidine] Hives   Morphine      Pill form not tolerated. Can have fluid form   Sulfa Antibiotics Hives   Albuterol  Other (See Comments) and Palpitations    Pt currently uses this medication.   Empagliflozin  Other (See Comments) and Hives    Perineal pain  Other Reaction(s): Other (See Comments)   Morphine  Sulfate Nausea And Vomiting, Rash and Other (See Comments)    Pt states that he is only allergic to the tablet form of this medication.

## 2024-11-02 ENCOUNTER — Telehealth: Payer: Self-pay

## 2024-11-02 LAB — BASIC METABOLIC PANEL WITH GFR
Anion gap: 9 (ref 5–15)
BUN: 13 mg/dL (ref 8–23)
CO2: 26 mmol/L (ref 22–32)
Calcium: 9.1 mg/dL (ref 8.9–10.3)
Chloride: 103 mmol/L (ref 98–111)
Creatinine, Ser: 0.71 mg/dL (ref 0.61–1.24)
GFR, Estimated: >60 mL/min (ref >60)
Glucose, Bld: 141 mg/dL — ABNORMAL HIGH (ref 70–99)
Potassium: 4.9 mmol/L (ref 3.5–5.1)
Sodium: 138 mmol/L (ref 135–145)

## 2024-11-02 LAB — CBC WITH DIFFERENTIAL/PLATELET
Abs Immature Granulocytes: 0.05 K/uL (ref 0.00–0.07)
Basophils Absolute: 0 K/uL (ref 0.0–0.1)
Basophils Relative: 0 %
Eosinophils Absolute: 0 K/uL (ref 0.0–0.5)
Eosinophils Relative: 0 %
HCT: 49.9 % (ref 39.0–52.0)
Hemoglobin: 16.4 g/dL (ref 13.0–17.0)
Immature Granulocytes: 1 %
Lymphocytes Relative: 5 %
Lymphs Abs: 0.5 K/uL — ABNORMAL LOW (ref 0.7–4.0)
MCH: 30.4 pg (ref 26.0–34.0)
MCHC: 32.9 g/dL (ref 30.0–36.0)
MCV: 92.6 fL (ref 80.0–100.0)
Monocytes Absolute: 0.6 K/uL (ref 0.1–1.0)
Monocytes Relative: 6 %
Neutro Abs: 8.4 K/uL — ABNORMAL HIGH (ref 1.7–7.7)
Neutrophils Relative %: 88 %
Platelets: 158 K/uL (ref 150–400)
RBC: 5.39 MIL/uL (ref 4.22–5.81)
RDW: 16.4 % — ABNORMAL HIGH (ref 11.5–15.5)
WBC: 9.6 K/uL (ref 4.0–10.5)
nRBC: 0 % (ref 0.0–0.2)

## 2024-11-02 LAB — CBG MONITORING, ED: Glucose-Capillary: 173 mg/dL — ABNORMAL HIGH (ref 70–99)

## 2024-11-02 MED ORDER — OXYCODONE-ACETAMINOPHEN 5-325 MG PO TABS: 1.0000 | ORAL_TABLET | ORAL | Status: DC | PRN

## 2024-11-02 MED ORDER — OXYCODONE HCL 5 MG PO TABS: 5.0000 mg | ORAL_TABLET | Freq: Three times a day (TID) | ORAL | 0 refills | Status: DC | PRN

## 2024-11-02 MED ORDER — OXYCODONE HCL 5 MG PO TABS
10.0000 mg | ORAL_TABLET | ORAL | Status: DC | PRN
  Administered 2024-11-02: 12:00:00 10 mg via ORAL
  Filled 2024-11-02: qty 2

## 2024-11-02 MED ADMIN — Insulin Aspart Inj Soln 100 Unit/ML: 3 [IU] | SUBCUTANEOUS | @ 12:00:00 | NDC 73070010011

## 2024-11-02 MED ADMIN — Famotidine Tab 20 MG: 20 mg | ORAL | @ 09:00:00 | NDC 00904719361

## 2024-11-02 MED ADMIN — Ranolazine Tab ER 12HR 500 MG: 1000 mg | ORAL | @ 10:00:00 | NDC 60687054911

## 2024-11-02 MED ADMIN — Methocarbamol Tab 500 MG: 500 mg | ORAL | @ 09:00:00 | NDC 60687055911

## 2024-11-02 MED ADMIN — Arformoterol Tartrate Soln Nebu 15 MCG/2ML (Base Equiv): 15 ug | RESPIRATORY_TRACT | @ 12:00:00 | NDC 70756061212

## 2024-11-02 MED ADMIN — Allopurinol Tab 300 MG: 300 mg | ORAL | @ 10:00:00 | NDC 51079020601

## 2024-11-02 NOTE — Progress Notes (Signed)
 Physical Therapy Treatment Patient Details Name: Lawrence [name] MRN: [id number] DOB: 07-Apr-1956 Today's Date: 11/02/2024   History of Present Illness Lawrence [name] is a 68 y.o. male who presented to ED on 10/31/24 with c/o chest pain radiating into shoulder. PMH significant for COPD on 2L chronic, CHF, CAD with hx of CABG ande stent 2016, HLD, Afib, HTN, DM.    PT Comments  Pt was supine in bed upon arrival. He endorses rough night however was agreeable to session and OOB activity. Pt had I'ly removed O2. Sao2 pleth reading poor however when accurate, sao2 >90 % on rm air. He tolerated getting OOB and ambulating in hallway with supervision. Encouraged pt to return to using his personal rollator at DC. DC recs remain appropriate. Acute PT will continue to follow and progress per current POC.     If plan is discharge home, recommend the following: A little help with walking and/or transfers;A little help with bathing/dressing/bathroom;Assistance with cooking/housework;Assist for transportation;Help with stairs or ramp for entrance     Equipment Recommendations  None recommended by PT       Precautions / Restrictions Precautions Precautions: Fall Recall of Precautions/Restrictions: Intact Restrictions Weight Bearing Restrictions Per Provider Order: No     Mobility  Bed Mobility Overal bed mobility: Needs Assistance Bed Mobility: Sit to Supine, Supine to Sit  Supine to sit: Supervision Sit to supine: Supervision General bed mobility comments: no physical assistance required to exit bed or later to return to bed    Transfers Overall transfer level: Needs assistance Equipment used: Rolling walker (2 wheels) Transfers: Sit to/from Stand Sit to Stand: Supervision    Ambulation/Gait Ambulation/Gait assistance: Supervision, Contact guard assist Gait Distance (Feet): 100 Feet Assistive device: Rolling walker (2 wheels) Gait Pattern/deviations: Step-through pattern, Decreased stride  length, Trunk flexed  General Gait Details: no standing rest required. poor pleth readings however when accurate reading aquired. sao2 > 90% on rm air   Balance Overall balance assessment: Needs assistance Sitting-balance support: No upper extremity supported, Feet unsupported Sitting balance-Leahy Scale: Good     Standing balance support: Bilateral upper extremity supported Standing balance-Leahy Scale: Good Standing balance comment: no LOB with and without UE support. encoouraged pt to use rollator at DC     Communication Communication Communication: No apparent difficulties  Cognition Arousal: Alert Behavior During Therapy: WFL for tasks assessed/performed   PT - Cognitive impairments: No apparent impairments      PT - Cognition Comments: Pt is A and O Following commands: Intact      Cueing Cueing Techniques: Verbal cues         Pertinent Vitals/Pain Pain Assessment Pain Location: back pain Pain Descriptors / Indicators: Discomfort Pain Intervention(s): Monitored during session, Limited activity within patient's tolerance     PT Goals (current goals can now be found in the care plan section) Acute Rehab PT Goals Patient Stated Goal: to get better    Frequency    Min 1X/week       Co-evaluation PT/OT/SLP Co-Evaluation/Treatment: Yes Reason for Co-Treatment: To address functional/ADL transfers PT goals addressed during session: Mobility/safety with mobility OT goals addressed during session: ADL's and self-care      AM-PAC PT 6 Clicks Mobility   Outcome Measure  Help needed turning from your back to your side while in a flat bed without using bedrails?: A Little Help needed moving from lying on your back to sitting on the side of a flat bed without using bedrails?: A Little Help needed moving  to and from a bed to a chair (including a wheelchair)?: A Little Help needed standing up from a chair using your arms (e.g., wheelchair or bedside chair)?:  None Help needed to walk in hospital room?: None Help needed climbing 3-5 steps with a railing? : A Little 6 Click Score: 20    End of Session Equipment Utilized During Treatment: Gait belt;Oxygen  Activity Tolerance: Patient tolerated treatment well Patient left: in bed;with call bell/phone within reach Nurse Communication: Mobility status PT Visit Diagnosis: Muscle weakness (generalized) (M62.81);Unsteadiness on feet (R26.81)     Time: 9153-9089 PT Time Calculation (min) (ACUTE ONLY): 24 min  Charges:    $Gait Training: 8-22 mins $Therapeutic Activity: 8-22 mins PT General Charges $$ ACUTE PT VISIT: 1 Visit                    Rankin Essex PTA 11/02/2024, 9:20 AM

## 2024-11-02 NOTE — Progress Notes (Signed)
° °  Brief Progress Note   _____________________________________________________________________________________________________________  Patient Name: Lawrence [name] Patient DOB: Jul 01, 1956 Date: @TODAY @      Data: Reviewed vital signs, labs, and clinical notes.    Action: No action required at this time.    Response:  Awaiting tele bed.  _____________________________________________________________________________________________________________  [facility] RN Expeditor Duha Abair S Randy Castrejon Please contact us  directly via secure chat (search for [facility]) or by calling us  at [phone] [facility]).

## 2024-11-02 NOTE — Discharge Summary (Signed)
 Physician Discharge Summary  Lawrence [name] FMW:[id number] DOB: 03-02-1956 DOA: 10/31/2024  PCP: [name], MD  Admit date: 10/31/2024 Discharge date: 11/02/2024  Admitted From: Home Disposition: Home  Recommendations for Outpatient Follow-up:  Follow up with PCP in 1-2 weeks   Home Health: None.  Offered PT and OT but patient declined Equipment/Devices: None  Discharge Condition: Stable CODE STATUS: DNR limited Diet recommendation: Heart healthy  Brief/Interim Summary: 68 year old Male with extensive cardiac history, recurrent admissions for chest pain who presents on 1217 for chief complaint of chest pain. Troponins trended and flat x 2. Suspect that chest pain is more related to underlying lung physiology as patient does have known COPD. Wheezing noted on arrival. I examined patient after breathing treatment with a improvement in breath sounds. Patient is still requiring supplemental oxygen  however he does have a home requirement.   Monitor on telemetry overnight.  Chest pain improved the following morning but still persistent.  Unclear etiology.  Suspect musculoskeletal origin.  Offered patient muscle relaxants which he declined.  Patient is chronically on narcotics and states that his home narcotic of Percocet 08/18/2024 is insufficient.  I recommend a short course of increased dose of Percocet.  15/325.  Oxycodone  5 mg prescribed on discharge and patient instructed to take it with his previously scheduled 10/325.  Other changes made home medication regimen.  Patient is discharged home.    Discharge Diagnoses:  Active Problems:   Chest pain  Atypical chest pain Unclear etiology.  ACS ruled out.  Initially concern for COPD but breath sounds are normal without wheezing, rhonchi, shortness of breath.  Suspect musculoskeletal.  Increase home dose of Percocet for a brief period of time.  Stable for discharge home.  Discharge Instructions  Discharge Instructions      Increase activity slowly   Complete by: As directed       Allergies as of 11/02/2024       Reactions   Demerol  [meperidine Hcl]    Demerol [meperidine] Hives   Morphine     Pill form not tolerated. Can have fluid form   Sulfa Antibiotics Hives   Albuterol  Other (See Comments), Palpitations   Pt currently uses this medication.   Empagliflozin  Other (See Comments), Hives   Perineal pain Other Reaction(s): Other (See Comments)   Morphine  Sulfate Nausea And Vomiting, Rash, Other (See Comments)   Pt states that he is only allergic to the tablet form of this medication.          Medication List     TAKE these medications    Accu-Chek Guide test strip Generic drug: glucose blood Use as instructed to check sugar daily for type 2 diabetes.   Accu-Chek Guide w/Device Kit Use to check blood sugars as directed   Accu-Chek Softclix Lancets lancets Use as instructed to check sugar daily for type 2 diabetes.   acetaminophen  325 MG tablet Commonly known as: TYLENOL  Take 2 tablets (650 mg total) by mouth every 6 (six) hours as needed for mild pain (pain score 1-3) or fever (or Fever >/= 101).   albuterol  (2.5 MG/3ML) 0.083% nebulizer solution Commonly known as: PROVENTIL  Take 3 mLs (2.5 mg total) by nebulization every 6 (six) hours as needed for wheezing or shortness of breath.   allopurinol  300 MG tablet Commonly known as: ZYLOPRIM  TAKE 1 TABLET BY MOUTH TWICE A DAY   ALPRAZolam  1 MG tablet Commonly known as: XANAX  TAKE 1 TABLET BY MOUTH 3 TIMES A DAY AS NEEDED  apixaban  5 MG Tabs tablet Commonly known as: Eliquis  Take 1 tablet (5 mg total) by mouth 2 (two) times daily.   atorvastatin  80 MG tablet Commonly known as: LIPITOR  TAKE 1 TABLET BY MOUTH AT BEDTIME   Blood Pressure Monitor Devi Use to check blood pressure daily   Breztri  Aerosphere 160-9-4.8 MCG/ACT Aero inhaler Generic drug: budesonide -glycopyrrolate -formoterol  INHALE 2 PUFFS BY MOUTH INTO THE LUNGS  2TIMES DAILY   cetirizine  10 MG tablet Commonly known as: ZYRTEC  Take 1 tablet (10 mg total) by mouth at bedtime.   cholecalciferol  25 MCG (1000 UNIT) tablet Commonly known as: VITAMIN D3 Take 1,000 Units by mouth daily.   famotidine  20 MG tablet Commonly known as: PEPCID  TAKE 1 TABLET BY MOUTH TWICE A DAY   Farxiga  10 MG Tabs tablet Generic drug: dapagliflozin  propanediol TAKE 1 TABLET BY MOUTH DAILY   iron  polysaccharides 150 MG capsule Commonly known as: Ferrex 150 Take one capsul every Monday, Wednesday, and Friday   metFORMIN  500 MG 24 hr tablet Commonly known as: GLUCOPHAGE -XR TAKE 1 TABLET BY MOUTH DAILY WITH EVENING MEAL   methocarbamol  500 MG tablet Commonly known as: ROBAXIN  TAKE 1 TABLET BY MOUTH EVERY 8 HOURS AS NEEDED FOR MUSCLE SPASMS   metoprolol  succinate 25 MG 24 hr tablet Commonly known as: TOPROL -XL Take 1.5 tablets (37.5 mg total) by mouth daily.   nitroGLYCERIN  0.4 MG SL tablet Commonly known as: NITROSTAT  Place 1 tablet (0.4 mg total) under the tongue every 5 (five) minutes as needed for chest pain (Do not take if blood pressure is low, systolic BP<110).   omega-3 acid ethyl esters 1 g capsule Commonly known as: LOVAZA  TAKE 4 CAPSULES (4 GRAMS TOTAL) BY MOUTHDAILY   oxyCODONE  5 MG immediate release tablet Commonly known as: Roxicodone  Take 1 tablet (5 mg total) by mouth every 8 (eight) hours as needed.   oxyCODONE -acetaminophen  10-325 MG tablet Commonly known as: PERCOCET TAKE 1 TABLET BY MOUTH EVERY 4 HOURS AS NEEDED FOR PAIN   OXYGEN  Inhale 2 L into the lungs at bedtime as needed (for shortness of breath).   pantoprazole  40 MG tablet Commonly known as: PROTONIX  TAKE 1 TABLET BY MOUTH 2 TIMES DAILY (30MINUTES BEFORE A MEAL)   predniSONE  20 MG tablet Commonly known as: DELTASONE  Take 2 tablets (40 mg total) by mouth daily with breakfast for 4 days.   ranolazine  1000 MG SR tablet Commonly known as: RANEXA  Take 1 tablet (1,000 mg  total) by mouth 2 (two) times daily.   sacubitril -valsartan  49-51 MG Commonly known as: Entresto  Take 1 tablet by mouth 2 (two) times daily.   traZODone  150 MG tablet Commonly known as: DESYREL  TAKE 1 TABLET BY MOUTH AT BEDTIME AS NEEDED FOR SLEEP.   Vitamin B-12 1000 MCG Subl Place 1 tablet under the tongue daily.        Follow-up Information     Schedule an appointment as soon as possible for a visit  with [name], MD.   Specialty: Family Medicine Contact information: [address] [place] [address] [place] [address] [phone]         Schedule an appointment as soon as possible for a visit  with Your cardiologist.          Go to  [facility] Emergency Department at [facility]   Specialty: Emergency Medicine Why: As needed, If symptoms worsen Contact information: [address] [place]  [phone]  Allergies[1]  Consultations: None   Procedures/Studies: DG Chest Portable 1 View Result Date: 10/31/2024 EXAM: 1 VIEW(S) XRAY OF THE CHEST 10/31/2024 06:30:00 PM COMPARISON: 10/16/2024 CLINICAL HISTORY: chest pain FINDINGS: LUNGS AND PLEURA: No focal pulmonary opacity. No pleural effusion. No pneumothorax. HEART AND MEDIASTINUM: Postoperative changes in the mediastinum. Heart size and pulmonary vascularity are normal. Mediastinal contours appear intact. Calcification of the aorta. BONES AND SOFT TISSUES: No acute osseous abnormality. IMPRESSION: 1. No acute cardiopulmonary pathology. 2. Postoperative changes in the mediastinum. Electronically signed by: Elsie Gravely MD 10/31/2024 06:39 PM EST RP Workstation: [id number]   US  Venous Img Lower Unilateral Right (DVT) Result Date: 10/20/2024 CLINICAL DATA:  Pain in the right lower extremity EXAM: RIGHT LOWER EXTREMITY VENOUS DOPPLER ULTRASOUND TECHNIQUE: Gray-scale sonography with compression, as well as color and duplex ultrasound, were performed to  evaluate the deep venous system(s) from the level of the common femoral vein through the popliteal and proximal calf veins. COMPARISON:  None Available. FINDINGS: VENOUS Normal compressibility of the common femoral, superficial femoral, and popliteal veins, as well as the visualized calf veins. Visualized portions of profunda femoral vein and great saphenous vein unremarkable. No filling defects to suggest DVT on grayscale or color Doppler imaging. Doppler waveforms show normal direction of venous flow, normal respiratory plasticity and response to augmentation. Limited views of the contralateral common femoral vein are unremarkable. OTHER None. Limitations: none IMPRESSION: Negative. Electronically Signed   By: Wilkie Lent M.D.   On: 10/20/2024 13:39   DG Chest 2 View Result Date: 10/16/2024 CLINICAL DATA:  Midsternal chest pain. EXAM: CHEST - 2 VIEW COMPARISON:  October 05, 2024 FINDINGS: Multiple sternal wires and vascular clips are noted. The heart size and mediastinal contours are within normal limits. A coronary artery stent is in place. There is marked severity calcification of the aortic arch. Both lungs are clear. The visualized skeletal structures are unremarkable. IMPRESSION: 1. Evidence of prior median sternotomy/CABG. 2. No active cardiopulmonary disease. Electronically Signed   By: Suzen Dials M.D.   On: 10/16/2024 20:19   DG Chest 2 View Result Date: 10/05/2024 CLINICAL DATA:  Chest pain and weakness. EXAM: CHEST - 2 VIEW COMPARISON:  September 09, 2024 FINDINGS: Multiple sternal wires and vascular clips are seen. The heart size and mediastinal contours are within normal limits. A coronary artery stent is in place. There is marked severity calcification of the aortic arch. Both lungs are clear. The visualized skeletal structures are unremarkable. IMPRESSION: 1. Evidence of prior median sternotomy/CABG. 2. No active cardiopulmonary disease. Electronically Signed   By: Suzen Dials M.D.   On: 10/05/2024 18:32      Subjective: Seen and examined on the day of discharge.  Stable no distress.  Appropriate discharge home.  Discharge Exam: Vitals:   11/02/24 0508 11/02/24 1154  BP:  (!) 132/93  Pulse:  93  Resp:  19  Temp: (!) 97.5 F (36.4 C) 98.6 F (37 C)  SpO2:  99%   Vitals:   11/02/24 0000 11/02/24 0330 11/02/24 0508 11/02/24 1154  BP: 110/78 125/80  (!) 132/93  Pulse: 95 93  93  Resp: (!) 25 14  19   Temp: 98 F (36.7 C) 98.1 F (36.7 C) (!) 97.5 F (36.4 C) 98.6 F (37 C)  TempSrc: Oral  Oral Oral  SpO2: 99% 98%  99%  Weight:      Height:        General: Pt is alert, awake, not in acute distress Cardiovascular: RRR,  S1/S2 +, no rubs, no gallops Respiratory: CTA bilaterally, no wheezing, no rhonchi Abdominal: Soft, NT, ND, bowel sounds + Extremities: no edema, no cyanosis    The results of significant diagnostics from this hospitalization (including imaging, microbiology, ancillary and laboratory) are listed below for reference.     Microbiology: Recent Results (from the past 240 hours)  Resp panel by RT-PCR (RSV, Flu A&B, Covid) Anterior Nasal Swab     Status: None   Collection Time: 10/31/24  6:40 PM   Specimen: Anterior Nasal Swab  Result Value Ref Range Status   SARS Coronavirus 2 by RT PCR NEGATIVE NEGATIVE Final    Comment: (NOTE) SARS-CoV-2 target nucleic acids are NOT DETECTED.  The SARS-CoV-2 RNA is generally detectable in upper respiratory specimens during the acute phase of infection. The lowest concentration of SARS-CoV-2 viral copies this assay can detect is 138 copies/mL. A negative result does not preclude SARS-Cov-2 infection and should not be used as the sole basis for treatment or other patient management decisions. A negative result may occur with  improper specimen collection/handling, submission of specimen other than nasopharyngeal swab, presence of viral mutation(s) within the areas targeted by this  assay, and inadequate number of viral copies(<138 copies/mL). A negative result must be combined with clinical observations, patient history, and epidemiological information. The expected result is Negative.  Fact Sheet for Patients:  bloggercourse.com  Fact Sheet for Healthcare Providers:  seriousbroker.it  This test is no t yet approved or cleared by the [place]  FDA and  has been authorized for detection and/or diagnosis of SARS-CoV-2 by FDA under an Emergency Use Authorization (EUA). This EUA will remain  in effect (meaning this test can be used) for the duration of the COVID-19 declaration under Section 564(b)(1) of the Act, 21 U.S.C.section 360bbb-3(b)(1), unless the authorization is terminated  or revoked sooner.       Influenza A by PCR NEGATIVE NEGATIVE Final   Influenza B by PCR NEGATIVE NEGATIVE Final    Comment: (NOTE) The Xpert Xpress SARS-CoV-2/FLU/RSV plus assay is intended as an aid in the diagnosis of influenza from Nasopharyngeal swab specimens and should not be used as a sole basis for treatment. Nasal washings and aspirates are unacceptable for Xpert Xpress SARS-CoV-2/FLU/RSV testing.  Fact Sheet for Patients: bloggercourse.com  Fact Sheet for Healthcare Providers: seriousbroker.it  This test is not yet approved or cleared by the [place]  FDA and has been authorized for detection and/or diagnosis of SARS-CoV-2 by FDA under an Emergency Use Authorization (EUA). This EUA will remain in effect (meaning this test can be used) for the duration of the COVID-19 declaration under Section 564(b)(1) of the Act, 21 U.S.C. section 360bbb-3(b)(1), unless the authorization is terminated or revoked.     Resp Syncytial Virus by PCR NEGATIVE NEGATIVE Final    Comment: (NOTE) Fact Sheet for Patients: bloggercourse.com  Fact Sheet for  Healthcare Providers: seriousbroker.it  This test is not yet approved or cleared by the [place]  FDA and has been authorized for detection and/or diagnosis of SARS-CoV-2 by FDA under an Emergency Use Authorization (EUA). This EUA will remain in effect (meaning this test can be used) for the duration of the COVID-19 declaration under Section 564(b)(1) of the Act, 21 U.S.C. section 360bbb-3(b)(1), unless the authorization is terminated or revoked.  Performed at [facility], [address] [place], [place] [address]      Labs: BNP (last 3 results) Recent Labs    06/30/24 2124 08/01/24 1916 08/13/24 1617  BNP 297.6* 165.3* 82.8   Basic Metabolic Panel: Recent Labs  Lab 10/31/24 1840 11/02/24 1200  NA 140 138  K 3.8 4.9  CL 101 103  CO2 27 26  GLUCOSE 82 141*  BUN 24* 13  CREATININE 1.01 0.71  CALCIUM  9.8 9.1   Liver Function Tests: Recent Labs  Lab 10/31/24 1840  AST 19  ALT 22  ALKPHOS 66  BILITOT 0.7  PROT 6.3*  ALBUMIN 3.9   Recent Labs  Lab 10/31/24 1840  LIPASE 41   No results for input(s): AMMONIA in the last 168 hours. CBC: Recent Labs  Lab 10/31/24 1840 11/02/24 1200  WBC 7.7 9.6  NEUTROABS 5.2 8.4*  HGB 17.0 16.4  HCT 52.6* 49.9  MCV 92.4 92.6  PLT 175 158   Cardiac Enzymes: No results for input(s): CKTOTAL, CKMB, CKMBINDEX, TROPONINI in the last 168 hours. BNP: Invalid input(s): POCBNP CBG: Recent Labs  Lab 11/01/24 0748 11/01/24 1122 11/01/24 1557 11/01/24 2129 11/02/24 1134  GLUCAP 166* 249* 219* 175* 173*   D-Dimer No results for input(s): DDIMER in the last 72 hours. Hgb A1c No results for input(s): HGBA1C in the last 72 hours. Lipid Profile No results for input(s): CHOL, HDL, LDLCALC, TRIG, CHOLHDL, LDLDIRECT in the last 72 hours. Thyroid  function studies No results for input(s): TSH, T4TOTAL, T3FREE, THYROIDAB in the last 72  hours.  Invalid input(s): FREET3 Anemia work up No results for input(s): VITAMINB12, FOLATE, FERRITIN, TIBC, IRON , RETICCTPCT in the last 72 hours. Urinalysis    Component Value Date/Time   COLORURINE YELLOW (A) 09/09/2024 2133   APPEARANCEUR CLEAR (A) 09/09/2024 2133   APPEARANCEUR Clear 05/25/2019 1554   LABSPEC 1.033 (H) 09/09/2024 2133   LABSPEC 1.012 03/07/2015 2143   PHURINE 6.0 09/09/2024 2133   GLUCOSEU >=500 (A) 09/09/2024 2133   GLUCOSEU Negative 03/07/2015 2143   HGBUR NEGATIVE 09/09/2024 2133   BILIRUBINUR NEGATIVE 09/09/2024 2133   BILIRUBINUR Negative 05/25/2019 1554   BILIRUBINUR Negative 03/07/2015 2143   KETONESUR NEGATIVE 09/09/2024 2133   PROTEINUR NEGATIVE 09/09/2024 2133   UROBILINOGEN 0.2 03/26/2017 0907   NITRITE NEGATIVE 09/09/2024 2133   LEUKOCYTESUR NEGATIVE 09/09/2024 2133   LEUKOCYTESUR Negative 03/07/2015 2143   Sepsis Labs Recent Labs  Lab 10/31/24 1840 11/02/24 1200  WBC 7.7 9.6   Microbiology Recent Results (from the past 240 hours)  Resp panel by RT-PCR (RSV, Flu A&B, Covid) Anterior Nasal Swab     Status: None   Collection Time: 10/31/24  6:40 PM   Specimen: Anterior Nasal Swab  Result Value Ref Range Status   SARS Coronavirus 2 by RT PCR NEGATIVE NEGATIVE Final    Comment: (NOTE) SARS-CoV-2 target nucleic acids are NOT DETECTED.  The SARS-CoV-2 RNA is generally detectable in upper respiratory specimens during the acute phase of infection. The lowest concentration of SARS-CoV-2 viral copies this assay can detect is 138 copies/mL. A negative result does not preclude SARS-Cov-2 infection and should not be used as the sole basis for treatment or other patient management decisions. A negative result may occur with  improper specimen collection/handling, submission of specimen other than nasopharyngeal swab, presence of viral mutation(s) within the areas targeted by this assay, and inadequate number of viral copies(<138  copies/mL). A negative result must be combined with clinical observations, patient history, and epidemiological information. The expected result is Negative.  Fact Sheet for Patients:  bloggercourse.com  Fact Sheet for Healthcare Providers:  seriousbroker.it  This test is no t yet approved or cleared by  the [place]  FDA and  has been authorized for detection and/or diagnosis of SARS-CoV-2 by FDA under an Emergency Use Authorization (EUA). This EUA will remain  in effect (meaning this test can be used) for the duration of the COVID-19 declaration under Section 564(b)(1) of the Act, 21 U.S.C.section 360bbb-3(b)(1), unless the authorization is terminated  or revoked sooner.       Influenza A by PCR NEGATIVE NEGATIVE Final   Influenza B by PCR NEGATIVE NEGATIVE Final    Comment: (NOTE) The Xpert Xpress SARS-CoV-2/FLU/RSV plus assay is intended as an aid in the diagnosis of influenza from Nasopharyngeal swab specimens and should not be used as a sole basis for treatment. Nasal washings and aspirates are unacceptable for Xpert Xpress SARS-CoV-2/FLU/RSV testing.  Fact Sheet for Patients: bloggercourse.com  Fact Sheet for Healthcare Providers: seriousbroker.it  This test is not yet approved or cleared by the [place]  FDA and has been authorized for detection and/or diagnosis of SARS-CoV-2 by FDA under an Emergency Use Authorization (EUA). This EUA will remain in effect (meaning this test can be used) for the duration of the COVID-19 declaration under Section 564(b)(1) of the Act, 21 U.S.C. section 360bbb-3(b)(1), unless the authorization is terminated or revoked.     Resp Syncytial Virus by PCR NEGATIVE NEGATIVE Final    Comment: (NOTE) Fact Sheet for Patients: bloggercourse.com  Fact Sheet for Healthcare  Providers: seriousbroker.it  This test is not yet approved or cleared by the [place]  FDA and has been authorized for detection and/or diagnosis of SARS-CoV-2 by FDA under an Emergency Use Authorization (EUA). This EUA will remain in effect (meaning this test can be used) for the duration of the COVID-19 declaration under Section 564(b)(1) of the Act, 21 U.S.C. section 360bbb-3(b)(1), unless the authorization is terminated or revoked.  Performed at [facility], [address] [place], [place] [address]      Time coordinating discharge: 40 minutes  SIGNED:   Calvin KATHEE Robson, MD  [facility] 11/02/2024, 1:30 PM Pager   If 7PM-7AM, please contact night-coverage     [1]  Allergies Allergen Reactions   Demerol  [Meperidine Hcl]    Demerol [Meperidine] Hives   Morphine      Pill form not tolerated. Can have fluid form   Sulfa Antibiotics Hives   Albuterol  Other (See Comments) and Palpitations    Pt currently uses this medication.   Empagliflozin  Other (See Comments) and Hives    Perineal pain  Other Reaction(s): Other (See Comments)   Morphine  Sulfate Nausea And Vomiting, Rash and Other (See Comments)    Pt states that he is only allergic to the tablet form of this medication.

## 2024-11-03 ENCOUNTER — Telehealth: Payer: Self-pay

## 2024-11-03 NOTE — Transitions of Care (Post Inpatient/ED Visit) (Signed)
 "  11/03/2024  Name: Lawrence [name] MRN: [id number] DOB: 02-17-1956  Today's TOC FU Call Status: Today's TOC FU Call Status:: Successful TOC FU Call Completed TOC FU Call Complete Date: 11/03/24  Patient's Name and Date of Birth confirmed. Name, DOB  Transition Care Management Follow-up Telephone Call Date of Discharge: 11/02/24 Discharge Facility: [facility]) Type of Discharge: Inpatient Admission Primary Inpatient Discharge Diagnosis:: COPD How have you been since you were released from the hospital?: Better Any questions or concerns?: No  Items Reviewed: Did you receive and understand the discharge instructions provided?: Yes Medications obtained,verified, and reconciled?: Yes (Medications Reviewed) Any new allergies since your discharge?: No Dietary orders reviewed?: Yes Do you have support at home?: Yes People in Home [RPT]: grandchild(ren)  Medications Reviewed Today: Medications Reviewed Today     Reviewed by Emmitt Pan, LPN (Licensed Practical Nurse) on 11/03/24 at 1028  Med List Status: <None>   Medication Order Taking? Sig Documenting Provider Last Dose Status Informant  Accu-Chek Softclix Lancets lancets [id number] Yes Use as instructed to check sugar daily for type 2 diabetes. [name], MD  Active Self, Other, Pharmacy Records  acetaminophen  (TYLENOL ) 325 MG tablet [id number] Yes Take 2 tablets (650 mg total) by mouth every 6 (six) hours as needed for mild pain (pain score 1-3) or fever (or Fever >/= 101). Dorinda Drue DASEN, MD  Active Self, Other, Pharmacy Records           Med Note [name], RICHERD CINDERELLA Kitchens Oct 23, 2024  9:32 AM) Needs to pick some up  albuterol  (PROVENTIL ) (2.5 MG/3ML) 0.083% nebulizer solution [id number] Yes Take 3 mLs (2.5 mg total) by nebulization every 6 (six) hours as needed for wheezing or shortness of breath. [name], MD  Active Self, Other, Pharmacy Records           Med Note [facility], [facility] A    Wed Oct 18, 2024  2:57 PM) prn  allopurinol  (ZYLOPRIM ) 300 MG tablet [id number] Yes TAKE 1 TABLET BY MOUTH TWICE A DAY [name], MD  Active Self, Other, Pharmacy Records  ALPRAZolam  (XANAX ) 1 MG tablet [id number] Yes TAKE 1 TABLET BY MOUTH 3 TIMES A DAY AS NEEDED [name], MD  Active Self, Other, Pharmacy Records           Med Note [facility], TIFF[facility] A   Wed Oct 18, 2024  2:57 PM) prn  apixaban  (ELIQUIS ) 5 MG TABS tablet [id number] Yes Take 1 tablet (5 mg total) by mouth 2 (two) times daily. [name], MD  Active Self, Other, Pharmacy Records  atorvastatin  (LIPITOR ) 80 MG tablet [id number] Yes TAKE 1 TABLET BY MOUTH AT BEDTIME [name], MD  Active Self, Other, Pharmacy Records  Blood Glucose Monitoring Suppl (ACCU-CHEK GUIDE) w/Device KIT [id number] Yes Use to check blood sugars as directed [name], MD  Active Self, Other, Pharmacy Records  Blood Pressure Monitor DEVI [id number] Yes Use to check blood pressure daily [name], MD  Active Self, Other, Pharmacy Records  BREZTRI  AEROSPHERE 160-9-4.8 MCG/ACT AERO inhaler [id number] Yes INHALE 2 PUFFS BY MOUTH INTO THE LUNGS 2TIMES DAILY [name], MD  Active Self, Other, Pharmacy Records  cetirizine  (ZYRTEC ) 10 MG tablet [id number] Yes Take 1 tablet (10 mg total) by mouth at bedtime. [name], MD  Active Self, Other, Pharmacy Records  cholecalciferol  (VITAMIN D3) 25 MCG (1000 UNIT) tablet [id number] Yes Take 1,000 Units by mouth daily.  [provider]  Active Self, Other, Pharmacy Records  Cyanocobalamin  (VITAMIN B-12) 1000 MCG SUBL [id number] Yes Place 1 tablet under the tongue daily. [provider]  Active Self, Other, Pharmacy Records  dapagliflozin  propanediol (FARXIGA ) 10 MG TABS tablet [id number] Yes TAKE 1 TABLET BY MOUTH DAILY [name], Nancyann BRAVO, MD  Active Self, Other, Pharmacy Records  famotidine  (PEPCID ) 20 MG tablet [id number] Yes TAKE 1 TABLET BY MOUTH TWICE A DAY [name],  MD  Active Self, Other, Pharmacy Records  glucose blood (ACCU-CHEK GUIDE) test strip [id number] Yes Use as instructed to check sugar daily for type 2 diabetes. [name], MD  Active Self, Other, Pharmacy Records  iron  polysaccharides [facility] 150) 150 MG capsule [id number] Yes Take one capsul every Monday, Wednesday, and Friday [name], MD  Active Self, Other, Pharmacy Records  metFORMIN  (GLUCOPHAGE -XR) 500 MG 24 hr tablet [id number] Yes TAKE 1 TABLET BY MOUTH DAILY WITH EVENING MEAL [name], MD  Active Self, Other, Pharmacy Records  methocarbamol  (ROBAXIN ) 500 MG tablet [id number] Yes TAKE 1 TABLET BY MOUTH EVERY 8 HOURS AS NEEDED FOR MUSCLE SPASMS [name], MD  Active   metoprolol  succinate (TOPROL -XL) 25 MG 24 hr tablet [id number] Yes Take 1.5 tablets (37.5 mg total) by mouth daily. Dorinda Drue DASEN, MD  Active Other, Pharmacy Records  nitroGLYCERIN  (NITROSTAT ) 0.4 MG SL tablet [id number] Yes Place 1 tablet (0.4 mg total) under the tongue every 5 (five) minutes as needed for chest pain (Do not take if blood pressure is low, systolic BP<110). [name], MD  Active Self, Other, Pharmacy Records           Med Note [facility], [facility] A   Wed Oct 18, 2024  3:00 PM) prn  omega-3 acid ethyl esters (LOVAZA ) 1 g capsule [id number] Yes TAKE 4 CAPSULES (4 GRAMS TOTAL) BY WARDEN [name], MD  Active Self, Other, Pharmacy Records  oxyCODONE  (ROXICODONE ) 5 MG immediate release tablet [id number] Yes Take 1 tablet (5 mg total) by mouth every 8 (eight) hours as needed. Jhonny Calvin NOVAK, MD  Active   oxyCODONE -acetaminophen  (PERCOCET) 10-325 MG tablet [id number] Yes TAKE 1 TABLET BY MOUTH EVERY 4 HOURS AS NEEDED FOR PAIN [name], MD  Active Other, Pharmacy Records           Med Note [facility], [facility] A   Wed Oct 18, 2024  3:01 PM) prn  OXYGEN  [id number] Yes Inhale 2 L into the lungs at bedtime as needed (for shortness of breath). [provider]  Active Self, Other,  Pharmacy Records  pantoprazole  (PROTONIX ) 40 MG tablet [id number] Yes TAKE 1 TABLET BY MOUTH 2 TIMES DAILY (30MINUTES BEFORE A MEAL) [name], MD  Active Self, Other, Pharmacy Records  predniSONE  (DELTASONE ) 20 MG tablet [id number] Yes Take 2 tablets (40 mg total) by mouth daily with breakfast for 4 days. Clarine Ozell LABOR, MD  Active   ranolazine  (RANEXA ) 1000 MG SR tablet [id number] Yes Take 1 tablet (1,000 mg total) by mouth 2 (two) times daily. Dorinda Drue DASEN, MD  Active Self, Other, Pharmacy Records  sacubitril -valsartan  (ENTRESTO ) 49-51 MG [id number] Yes Take 1 tablet by mouth 2 (two) times daily. [name], MD  Active Self, Other, Pharmacy Records  traZODone  (DESYREL ) 150 MG tablet [id number] Yes TAKE 1 TABLET BY MOUTH AT BEDTIME AS NEEDED FOR SLEEP. [name], MD  Active Self, Other, Pharmacy Records           Med  Note [facility], TIFFANY A   Wed Oct 18, 2024  3:02 PM) prn  Med List Note [name], Richerd GRADE, [place] 10/23/24 1218): 10/23/24 Medications reviewed with grandson Lawrence [name] patient had not discontinued the medications listed to stop on AVS: STOP taking: isosorbide  mononitrate 120 MG 24 hr tablet (IMDUR ) ondansetron  4 MG tablet (Zofran ). Lawrence [name] state he has removed them at this time  06/16/24 Reviewed with grandson, Lawrence [name] with patient's permission today 06/29/24 Medications reviewed with grandson, Lawrence [name] who assist in managing patient's medications, Patient's medications list from 06/23/24 AVS reviewed and patient to restart Imdur  and Metoprolol . Patient didn't have Metoprolol . He is to check cabinet where his paused medications were.            Home Care and Equipment/Supplies: Were Home Health Services Ordered?: NA Any new equipment or medical supplies ordered?: NA  Functional Questionnaire: Do you need assistance with bathing/showering or dressing?: No Do you need assistance with meal preparation?: No Do you need assistance with eating?: No Do you have difficulty maintaining  continence: No Do you need assistance with getting out of bed/getting out of a chair/moving?: No Do you have difficulty managing or taking your medications?: No  Follow up appointments reviewed: PCP Follow-up appointment confirmed?: Yes Date of PCP follow-up appointment?: 11/10/24 Follow-up Provider: [facility] Follow-up appointment confirmed?: NA Do you need transportation to your follow-up appointment?: No Do you understand care options if your condition(s) worsen?: Yes-patient verbalized understanding    SIGNATURE Julian Lemmings, LPN [facility] Nurse Health Advisor Direct Dial [phone]  "

## 2024-11-06 ENCOUNTER — Other Ambulatory Visit: Payer: Self-pay

## 2024-11-06 ENCOUNTER — Telehealth: Payer: Self-pay

## 2024-11-06 DIAGNOSIS — I2510 Atherosclerotic heart disease of native coronary artery without angina pectoris: Secondary | ICD-10-CM | POA: Insufficient documentation

## 2024-11-06 DIAGNOSIS — I110 Hypertensive heart disease with heart failure: Secondary | ICD-10-CM | POA: Diagnosis not present

## 2024-11-06 DIAGNOSIS — Z7689 Persons encountering health services in other specified circumstances: Secondary | ICD-10-CM | POA: Diagnosis not present

## 2024-11-06 DIAGNOSIS — E119 Type 2 diabetes mellitus without complications: Secondary | ICD-10-CM | POA: Diagnosis not present

## 2024-11-06 DIAGNOSIS — I5020 Unspecified systolic (congestive) heart failure: Secondary | ICD-10-CM | POA: Diagnosis not present

## 2024-11-06 DIAGNOSIS — R1013 Epigastric pain: Secondary | ICD-10-CM | POA: Diagnosis present

## 2024-11-06 DIAGNOSIS — I482 Chronic atrial fibrillation, unspecified: Secondary | ICD-10-CM | POA: Insufficient documentation

## 2024-11-06 DIAGNOSIS — Z7901 Long term (current) use of anticoagulants: Secondary | ICD-10-CM | POA: Insufficient documentation

## 2024-11-06 DIAGNOSIS — J449 Chronic obstructive pulmonary disease, unspecified: Secondary | ICD-10-CM | POA: Insufficient documentation

## 2024-11-06 LAB — COMPREHENSIVE METABOLIC PANEL WITH GFR
ALT: 21 U/L (ref 0–44)
AST: 15 U/L (ref 15–41)
Albumin: 3.9 g/dL (ref 3.5–5.0)
Alkaline Phosphatase: 66 U/L (ref 38–126)
Anion gap: 10 (ref 5–15)
BUN: 18 mg/dL (ref 8–23)
CO2: 30 mmol/L (ref 22–32)
Calcium: 9.5 mg/dL (ref 8.9–10.3)
Chloride: 101 mmol/L (ref 98–111)
Creatinine, Ser: 0.91 mg/dL (ref 0.61–1.24)
GFR, Estimated: >60 mL/min
Glucose, Bld: 253 mg/dL — ABNORMAL HIGH (ref 70–99)
Potassium: 3.6 mmol/L (ref 3.5–5.1)
Sodium: 141 mmol/L (ref 135–145)
Total Bilirubin: 0.5 mg/dL (ref 0.0–1.2)
Total Protein: 6 g/dL — ABNORMAL LOW (ref 6.5–8.1)

## 2024-11-06 LAB — CBC
HCT: 53.7 % — ABNORMAL HIGH (ref 39.0–52.0)
Hemoglobin: 17.6 g/dL — ABNORMAL HIGH (ref 13.0–17.0)
MCH: 30.2 pg (ref 26.0–34.0)
MCHC: 32.8 g/dL (ref 30.0–36.0)
MCV: 92.3 fL (ref 80.0–100.0)
Platelets: 166 K/uL (ref 150–400)
RBC: 5.82 MIL/uL — ABNORMAL HIGH (ref 4.22–5.81)
RDW: 16.4 % — ABNORMAL HIGH (ref 11.5–15.5)
WBC: 6 K/uL (ref 4.0–10.5)
nRBC: 0 % (ref 0.0–0.2)

## 2024-11-06 LAB — LIPASE, BLOOD: Lipase: 24 U/L (ref 11–51)

## 2024-11-06 NOTE — Transitions of Care (Post Inpatient/ED Visit) (Signed)
 " Transition of Care week 3  Visit Note  11/06/2024  Name: Lawrence [name] MRN: [id number]          DOB: 1956/07/23  Situation: Patient enrolled in [facility] 30-day program. Visit completed with patient by telephone.   Background:   Initial Transition Care Management Follow-up Telephone Call Discharge Date and Diagnosis: 11/02/24, COPD   Past Medical History:  Diagnosis Date   A-fib (HCC)    Anemia    Anginal pain    Anxiety    Arthritis    Asthma    Atrial fibrillation, chronic (HCC) 10/19/2023   CAD (coronary artery disease)    a. 2002 CABGx2 (LIMA->LAD, VG->VG->OM1);  b. 09/2012 DES->OM;  c. 03/2015 PTCA of LAD [facility]) in setting of atretic LIMA; d. 05/2015 Cath [facility]): nonobs dzs; e. 06/2015 Cath ([facility]): LM nl, LAD 45p/d ISR, 50d, D1/2 small, LCX 50p/d ISR, OM1 70ost, 30 ISR, VG->OM1 50ost, 47m, LIMA->LAD 99p/d - atretic, RCA dom, nl; f.cath 10/16: 40-50%(FFR 0.90) pLAD, 75% (FFR 0.77) mLAD s/p PCI/DES, oRCA 40% (FFR0.95)   Cancer (HCC)    SKIN CANCER ON BACK   Celiac disease    Chronic diastolic CHF (congestive heart failure) (HCC)    a. 06/2009 Echo: EF 60-65%, Gr 1 DD, triv AI, mildly dil LA, nl RV.   COPD (chronic obstructive pulmonary disease) (HCC)    a. Chronic bronchitis and emphysema.   DDD (degenerative disc disease), lumbar    Diverticulosis    Dysrhythmia    Essential hypertension    GERD (gastroesophageal reflux disease)    History of hiatal hernia    History of kidney stones    H/O   History of tobacco abuse    a. Quit 2014.   Myocardial infarction [facility] 2002   4 STENTS   Pancreatitis    PSVT (paroxysmal supraventricular tachycardia)    a. 10/2012 Noted on Zio Patch.   RLL pneumonia 05/30/2024   Sleep apnea    LOST CORD TO CPAP -ONLY 02 @ BEDTIME   Tubular adenoma of colon    Type II diabetes mellitus (HCC)     Assessment: Patient Reported Symptoms: Cognitive Cognitive Status: Able to follow simple commands, Alert and oriented to person, place, and time,  Normal speech and language skills      Neurological Neurological Review of Symptoms: No symptoms reported Neurological Management Strategies: Medical device, Medication therapy, Routine screening Neurological Self-Management Outcome: 4 (good)  HEENT HEENT Symptoms Reported: No symptoms reported      Cardiovascular Cardiovascular Symptoms Reported: No symptoms reported Does patient have uncontrolled Hypertension?: Yes Is patient checking Blood Pressure at home?: Yes Patient's Recent BP reading at home: Patient to take his BP cuff to MD appointment Friday 11/10/24 to check calibration Cardiovascular Management Strategies: Medical device, Medication therapy, Routine screening  Respiratory Respiratory Symptoms Reported: No symptoms reported Other Respiratory Symptoms: I've been on Predisone and cleared this up pretty much Respiratory Management Strategies: Activity, Adequate rest, Oxygen  therapy Respiratory Self-Management Outcome: 4 (good)  Endocrine Endocrine Symptoms Reported: No symptoms reported Is patient diabetic?: Yes Is patient checking blood sugars at home?: Yes List most recent blood sugar readings, include date and time of day: 130 Endocrine Self-Management Outcome: 4 (good)  Gastrointestinal Gastrointestinal Symptoms Reported: Cramping, Other      Genitourinary Genitourinary Symptoms Reported: Other (chronic) Genitourinary Self-Management Outcome: 3 (uncertain)  Integumentary Integumentary Symptoms Reported: No symptoms reported Skin Management Strategies: Medication therapy, Routine screening Skin Self-Management Outcome: 3 (uncertain)  Musculoskeletal Musculoskelatal Symptoms Reviewed:  Unsteady gait, Weakness Additional Musculoskeletal Details: chronic aches and pain Musculoskeletal Management Strategies: Activity, Adequate rest, Diet modification, Fluid modification, Medical device, Medication therapy, Routine screening Musculoskeletal Self-Management Outcome: 3  (uncertain)      Psychosocial Psychosocial Symptoms Reported: No symptoms reported (i'm feeling better  since the Prednisone  and my pain meds were adjusted)         There were no vitals filed for this visit. Pain Scale: 0-10 Pain Score: 4  Pain Location: Abdomen (hurts when I eat, like the time when I had an ulcer, but I see Dr. Gasper on this Friday) Pain Descriptors / Indicators: Dull, Aching, Discomfort Pain Onset: On-going Pain Intervention(s): Medication (See eMAR)  Medications Reviewed Today     Reviewed by Eilleen Richerd GRADE, RN (Registered Nurse) on 11/06/24 at 1535  Med List Status: <None>   Medication Order Taking? Sig Documenting Provider Last Dose Status Informant  Accu-Chek Softclix Lancets lancets [id number]  Use as instructed to check sugar daily for type 2 diabetes. [name], MD  Active Self, Other, Pharmacy Records  acetaminophen  (TYLENOL ) 325 MG tablet [id number] Yes Take 2 tablets (650 mg total) by mouth every 6 (six) hours as needed for mild pain (pain score 1-3) or fever (or Fever >/= 101). Dorinda Drue DASEN, MD  Active Self, Other, Pharmacy Records           Med Note [name], RICHERD GRADE Kitchens Oct 23, 2024  9:32 AM) Needs to pick some up  albuterol  (PROVENTIL ) (2.5 MG/3ML) 0.083% nebulizer solution [id number]  Take 3 mLs (2.5 mg total) by nebulization every 6 (six) hours as needed for wheezing or shortness of breath. [name], MD  Active Self, Other, Pharmacy Records           Med Note [facility], [facility] A   Wed Oct 18, 2024  2:57 PM) prn  allopurinol  (ZYLOPRIM ) 300 MG tablet [id number] Yes TAKE 1 TABLET BY MOUTH TWICE A DAY [name], MD  Active Self, Other, Pharmacy Records  ALPRAZolam  (XANAX ) 1 MG tablet [id number] Yes TAKE 1 TABLET BY MOUTH 3 TIMES A DAY AS NEEDED [name], MD  Active Self, Other, Pharmacy Records           Med Note [name], [place] A   Wed Oct 18, 2024  2:57 PM) prn  apixaban  (ELIQUIS ) 5 MG TABS tablet [id number] Yes Take 1  tablet (5 mg total) by mouth 2 (two) times daily. [name], MD  Active Self, Other, Pharmacy Records  atorvastatin  (LIPITOR ) 80 MG tablet [id number] Yes TAKE 1 TABLET BY MOUTH AT BEDTIME [name], MD  Active Self, Other, Pharmacy Records  Blood Glucose Monitoring Suppl (ACCU-CHEK GUIDE) w/Device KIT [id number]  Use to check blood sugars as directed [name], MD  Active Self, Other, Pharmacy Records  Blood Pressure Monitor DEVI [id number]  Use to check blood pressure daily [name], MD  Active Self, Other, Pharmacy Records  BREZTRI  AEROSPHERE 160-9-4.8 MCG/ACT AERO inhaler [id number] Yes INHALE 2 PUFFS BY MOUTH INTO THE LUNGS 2TIMES DAILY [name], MD  Active Self, Other, Pharmacy Records  cetirizine  (ZYRTEC ) 10 MG tablet [id number] Yes Take 1 tablet (10 mg total) by mouth at bedtime. [name], MD  Active Self, Other, Pharmacy Records  cholecalciferol  (VITAMIN D3) 25 MCG (1000 UNIT) tablet [id number] Yes Take 1,000 Units by mouth daily. [provider]  Active Self, Other, Pharmacy Records  Cyanocobalamin  (VITAMIN B-12) 1000 MCG SUBL [id number]  Yes Place 1 tablet under the tongue daily. [provider]  Active Self, Other, Pharmacy Records  dapagliflozin  propanediol (FARXIGA ) 10 MG TABS tablet [id number] Yes TAKE 1 TABLET BY MOUTH DAILY [name], Nancyann BRAVO, MD  Active Self, Other, Pharmacy Records  famotidine  (PEPCID ) 20 MG tablet [id number] Yes TAKE 1 TABLET BY MOUTH TWICE A DAY [name], MD  Active Self, Other, Pharmacy Records  glucose blood (ACCU-CHEK GUIDE) test strip [id number]  Use as instructed to check sugar daily for type 2 diabetes. [name], MD  Active Self, Other, Pharmacy Records  iron  polysaccharides [facility] 150) 150 MG capsule [id number] Yes Take one capsul every Monday, Wednesday, and Friday [name], MD  Active Self, Other, Pharmacy Records  metFORMIN  (GLUCOPHAGE -XR) 500 MG 24 hr tablet [id number] Yes TAKE 1  TABLET BY MOUTH DAILY WITH EVENING MEAL [name], MD  Active Self, Other, Pharmacy Records  methocarbamol  (ROBAXIN ) 500 MG tablet [id number] Yes TAKE 1 TABLET BY MOUTH EVERY 8 HOURS AS NEEDED FOR MUSCLE SPASMS [name], MD  Active   metoprolol  succinate (TOPROL -XL) 25 MG 24 hr tablet [id number] Yes Take 1.5 tablets (37.5 mg total) by mouth daily. Dorinda Drue DASEN, MD  Active Other, Pharmacy Records  nitroGLYCERIN  (NITROSTAT ) 0.4 MG SL tablet [id number] Yes Place 1 tablet (0.4 mg total) under the tongue every 5 (five) minutes as needed for chest pain (Do not take if blood pressure is low, systolic BP<110). [name], MD  Active Self, Other, Pharmacy Records           Med Note [facility], [facility] A   Wed Oct 18, 2024  3:00 PM) prn  omega-3 acid ethyl esters (LOVAZA ) 1 g capsule [id number] Yes TAKE 4 CAPSULES (4 GRAMS TOTAL) BY WARDEN [name], MD  Active Self, Other, Pharmacy Records  oxyCODONE  (ROXICODONE ) 5 MG immediate release tablet [id number] Yes Take 1 tablet (5 mg total) by mouth every 8 (eight) hours as needed. Jhonny Calvin NOVAK, MD  Active   oxyCODONE -acetaminophen  (PERCOCET) 10-325 MG tablet [id number] Yes TAKE 1 TABLET BY MOUTH EVERY 4 HOURS AS NEEDED FOR PAIN [name], MD  Active Other, Pharmacy Records           Med Note [facility], [facility] A   Wed Oct 18, 2024  3:01 PM) prn  OXYGEN  [id number] Yes Inhale 2 L into the lungs at bedtime as needed (for shortness of breath). [provider]  Active Self, Other, Pharmacy Records  pantoprazole  (PROTONIX ) 40 MG tablet [id number] Yes TAKE 1 TABLET BY MOUTH 2 TIMES DAILY (30MINUTES BEFORE A MEAL) [name], MD  Active Self, Other, Pharmacy Records  ranolazine  (RANEXA ) 1000 MG SR tablet [id number] Yes Take 1 tablet (1,000 mg total) by mouth 2 (two) times daily. Dorinda Drue DASEN, MD  Active Self, Other, Pharmacy Records  sacubitril -valsartan  (ENTRESTO ) 49-51 MG [id number] Yes Take 1 tablet by mouth 2 (two) times daily.  [name], MD  Active Self, Other, Pharmacy Records  traZODone  (DESYREL ) 150 MG tablet [id number] Yes TAKE 1 TABLET BY MOUTH AT BEDTIME AS NEEDED FOR SLEEP. [name], MD  Active Self, Other, Pharmacy Records           Med Note [facility], [facility] A   Wed Oct 18, 2024  3:02 PM) prn  Med List Note [name], Richerd GRADE, [place] 10/23/24 1218): 10/23/24 Medications reviewed with grandson [name] patient had not discontinued the medications listed to stop on AVS: STOP taking: isosorbide  mononitrate 120  MG 24 hr tablet (IMDUR ) ondansetron  4 MG tablet (Zofran ). [name] state he has removed them at this time  06/16/24 Reviewed with grandson, [name] with patient's permission today 06/29/24 Medications reviewed with grandson, [name] who assist in managing patient's medications, Patient's medications list from 06/23/24 AVS reviewed and patient to restart Imdur  and Metoprolol . Patient didn't have Metoprolol . He is to check cabinet where his paused medications were.            Recommendation:   Continue Current Plan of Care  Follow Up Plan:   Telephone follow-up in 1 week  Richerd Fish, RN, BSN, CCM [facility], [facility] Management Coordinator Direct Dial: [phone]           "

## 2024-11-06 NOTE — ED Triage Notes (Signed)
 Pt arrives via EMS from home with c/o abd pain

## 2024-11-06 NOTE — ED Triage Notes (Signed)
 Pt to ED via EMS from home, pt reports abd pain that began yesterday with n/v pt reports hx pancreatitis.

## 2024-11-07 ENCOUNTER — Telehealth

## 2024-11-07 ENCOUNTER — Emergency Department: Admission: EM | Admit: 2024-11-07 | Discharge: 2024-11-07 | Disposition: A | Discharge status: Home / Self Care

## 2024-11-07 ENCOUNTER — Emergency Department

## 2024-11-07 DIAGNOSIS — R1013 Epigastric pain: Secondary | ICD-10-CM

## 2024-11-07 DIAGNOSIS — Z7689 Persons encountering health services in other specified circumstances: Secondary | ICD-10-CM

## 2024-11-07 LAB — URINALYSIS, ROUTINE W REFLEX MICROSCOPIC
Bacteria, UA: NONE SEEN
Bilirubin Urine: NEGATIVE
Glucose, UA: >=500 mg/dL — AB
Hgb urine dipstick: NEGATIVE
Ketones, ur: NEGATIVE mg/dL
Leukocytes,Ua: NEGATIVE
Nitrite: NEGATIVE
Protein, ur: NEGATIVE mg/dL
Specific Gravity, Urine: 1.027 (ref 1.005–1.030)
Squamous Epithelial / HPF: 0 /HPF (ref 0–5)
pH: 5 (ref 5.0–8.0)

## 2024-11-07 MED ORDER — OXYCODONE-ACETAMINOPHEN 5-325 MG PO TABS
1.0000 | ORAL_TABLET | Freq: Once | ORAL | Status: AC
  Administered 2024-11-07: 1 via ORAL
  Filled 2024-11-07: qty 1

## 2024-11-07 MED ORDER — IOHEXOL 300 MG/ML  SOLN
100.0000 mL | Freq: Once | INTRAMUSCULAR | Status: AC | PRN
  Administered 2024-11-07: 100 mL via INTRAVENOUS

## 2024-11-07 MED ORDER — SUCRALFATE 1 G PO TABS
1.0000 g | ORAL_TABLET | Freq: Once | ORAL | Status: AC
  Administered 2024-11-07: 1 g via ORAL
  Filled 2024-11-07: qty 1

## 2024-11-07 NOTE — ED Provider Notes (Signed)
 "  [facility] Provider Note    Event Date/Time   First MD Initiated Contact with Patient 11/07/24 [phone]     (approximate)   History   Abdominal Pain   HPI  Lawrence [name] is a 68 y.o. male  history significant for CAD (s/p of stent x 4/CABG 2016), A-fib on Eliquis , HFrEF (EF 35 to 40%, 07/2024), PSVT/frequent PVCs, HTN,DM, COPD on 2L O2, OSA not using CPAP, multiple prior hospitalizations related to chest pain/CHF/COPD who presents today with 6 hours of epigastric abdominal pain.  Patient states that pain is constant, gnawing and associated with nausea.  He states that he has experienced this similar kind of pain with prior episodes of pancreatitis.  Denies any chest pain shortness of breath today.  Reports compliance of all of his medications.   This patient is being monitored by the ED High Utilization Committee for frequent ED visits.  And there is a care plan in place      Physical Exam   Triage Vital Signs: ED Triage Vitals  Encounter Vitals Group     BP 11/06/24 2135 (!) 157/109     Girls Systolic BP Percentile --      Girls Diastolic BP Percentile --      Boys Systolic BP Percentile --      Boys Diastolic BP Percentile --      Pulse Rate 11/06/24 2135 100     Resp 11/06/24 2135 18     Temp 11/06/24 2135 97.7 F (36.5 C)     Temp src --      SpO2 11/06/24 2135 95 %     Weight --      Height --      Head Circumference --      Peak Flow --      Pain Score 11/06/24 2134 7     Pain Loc --      Pain Education --      Exclude from Growth Chart --     Most recent vital signs: Vitals:   11/07/24 0238 11/07/24 0459  BP: 127/82 (!) 152/98  Pulse: 82 95  Resp: 20 14  Temp:    SpO2: 98% 97%    Nursing Triage Note reviewed. Vital signs reviewed and patients oxygen  saturation is normoxic  General: Patient is well nourished, well developed, awake and alert, resting comfortably in no acute distress Head: Normocephalic and atraumatic Eyes:  Normal inspection, extraocular muscles intact, no conjunctival pallor Ear, nose, throat: Normal external exam Neck: Normal range of motion Respiratory: Patient is in no respiratory distress, lungs very mild wheezes Cardiovascular: Patient is not tachycardic, RRR without murmur appreciated GI: Abd soft, tender to palpation in epigastrium with no guarding or rebound  Back: Normal inspection of the back with good strength and range of motion throughout all ext Extremities: pulses intact with good cap refills, no LE pitting edema or calf tenderness Neuro: The patient is alert and oriented to person, place, and time, appropriately conversive, with 5/5 bilat UE/LE strength, no gross motor or sensory defects noted. Coordination appears to be adequate. Skin: Warm, dry, and intact Psych: normal mood and affect, no SI or HI  ED Results / Procedures / Treatments   Labs (all labs ordered are listed, but only abnormal results are displayed) Labs Reviewed  COMPREHENSIVE METABOLIC PANEL WITH GFR - Abnormal; Notable for the following components:      Result Value   Glucose, Bld 253 (*)    Total  Protein 6.0 (*)    All other components within normal limits  CBC - Abnormal; Notable for the following components:   RBC 5.82 (*)    Hemoglobin 17.6 (*)    HCT 53.7 (*)    RDW 16.4 (*)    All other components within normal limits  URINALYSIS, ROUTINE W REFLEX MICROSCOPIC - Abnormal; Notable for the following components:   Color, Urine YELLOW (*)    APPearance CLEAR (*)    Glucose, UA >=500 (*)    All other components within normal limits  LIPASE, BLOOD     EKG EKG and rhythm strip are interpreted by myself:   EKG: afib rhythm] at heart rate of 97, normal QRS duration, QTc 462, nonspecific ST segments and T waves no ectopy EKG not consistent with Acute STEMI Rhythm strip: afib in lead II   RADIOLOGY CT abd and pelvis with iv contrast: No acute abnormality on my independent review interpretation  radiologist agrees    PROCEDURES:  Critical Care performed: No  Procedures   MEDICATIONS ORDERED IN ED: Medications  sucralfate  (CARAFATE ) tablet 1 g (1 g Oral Given 11/07/24 0236)  oxyCODONE -acetaminophen  (PERCOCET/ROXICET) 5-325 MG per tablet 1 tablet (1 tablet Oral Given 11/07/24 0321)  iohexol  (OMNIPAQUE ) 300 MG/ML solution 100 mL (100 mLs Intravenous Contrast Given 11/07/24 0402)     IMPRESSION / MDM / ASSESSMENT AND PLAN / ED COURSE                                Differential diagnosis includes, but is not limited to: Pancreatitis electrolyte derangement anemia, acute on chronic pain, UTI, anemia  ED course: Patient is well-appearing and abdominal exam demonstrates no evidence of peritonitis.  Patient had no leukocytosis no profound anemia urinalysis was not consistent with UTI.  He had no elevated liver function tests or lipase.  Given patient's concerns a CT abdomen pelvis with IV contrast was performed which demonstrated no evidence of pancreatitis.  Patient feels comfortable returning home at this time he has tolerated p.o.  All questions answered and he requested discharge   Clinical Course as of 11/07/24 0738  Tue Nov 07, 2024  0218 CBC(!) No leukocytosis no profound anemia [HD]  0218 Comprehensive metabolic panel(!) No elevated LFTs, l and only mild elevated glucose with no anion gap [HD]  0219 Lipase: 24 Not elevated [HD]  0454 CT ABDOMEN PELVIS W CONTRAST Patient has been able to tolerate p.o. repeat abdominal exam is benign.  Reviewed the CT results and he feels comfortable returning home.  I did give him a copy of the CT results for any incidental findings.  All questions answered and patient voiced understanding and requested discharge [HD]    Clinical Course User Index [HD] Nicholaus Rolland BRAVO, MD   At time of discharge there is no evidence of acute life, limb, vision, or fertility threat. Patient has stable vital signs, pain is well controlled, patient is  ambulatory and p.o. tolerant.  Discharge instructions were completed using the EPIC system. I would refer you to those at this time. All warnings prescriptions follow-up etc. were discussed in detail with the patient. Patient indicates understanding and is agreeable with this plan. All questions answered.  Patient is made aware that they may return to the emergency department for any worsening or new condition or for any other emergency.  -- Risk: 5 This patient has a high risk of morbidity due to further diagnostic  testing or treatment. Rationale: This patients evaluation and management involve a high risk of morbidity due to the potential severity of presenting symptoms, need for diagnostic testing, and/or initiation of treatment that may require close monitoring. The differential includes conditions with potential for significant deterioration or requiring escalation of care. Treatment decisions in the ED, including medication administration, procedural interventions, or disposition planning, reflect this level of risk. COPA: 5 The patient has the following acute or chronic illness/injury that poses a possible threat to life or bodily function: [X] : The patient has a potentially serious acute condition or an acute exacerbation of a chronic illness requiring urgent evaluation and management in the Emergency Department. The clinical presentation necessitates immediate consideration of life-threatening or function-threatening diagnoses, even if they are ultimately ruled out.   FINAL CLINICAL IMPRESSION(S) / ED DIAGNOSES   Final diagnoses:  Epigastric pain  Frequent patient in emergency department     Rx / DC Orders   ED Discharge Orders     None        Note:  This document was prepared using Dragon voice recognition software and may include unintentional dictation errors.   Nicholaus Rolland BRAVO, MD 11/07/24 [phone]  "

## 2024-11-07 NOTE — Discharge Instructions (Signed)
 RETURN PRECAUTIONS & AFTERCARE: (ENGLISH) RETURN PRECAUTIONS: Return immediately to the emergency department or see/call your doctor if you feel worse, weak or have changes in speech or vision, are short of breath, have fever, vomiting, pain, bleeding or dark stool, trouble urinating or any new issues. Return here or see/call your doctor if not improving as expected for your suspected condition. FOLLOW-UP CARE: Call your doctor and/or any doctors we referred you to for more advice and to make an appointment. Do this today, tomorrow or after the weekend. Some doctors only take PPO insurance so if you have HMO insurance you may want to contact your HMO or your regular doctor for referral to a specialist within your plan. Either way tell the doctor's office that it was a referral from the emergency department so you get the soonest possible appointment.  YOUR TEST RESULTS: Take result reports of any blood or urine tests, imaging tests and EKG's to your doctor and any referral doctor. Have any abnormal tests repeated. Your doctor or a referral doctor can let you know when this should be done. Also make sure your doctor contacts this hospital to get any test results that are not currently available such as cultures or special tests for infection and final imaging reports, which are often not available at the time you leave the ER but which may list additional important findings that are not documented on the preliminary report. BLOOD PRESSURE: If your blood pressure was greater than 120/80 have your blood pressure rechecked within 1 to 2 weeks. MEDICATION SIDE EFFECTS: Do not drive, walk, bike, take the bus, etc. if you have received or are being prescribed any sedating medications such as those for pain or anxiety or certain antihistamines like Benadryl . If you have been give one of these here get a taxi home or have a friend drive you home. Ask your pharmacist to counsel you on potential side effects of any new  medication

## 2024-11-10 ENCOUNTER — Inpatient Hospital Stay: Admitting: Family Medicine

## 2024-11-10 ENCOUNTER — Other Ambulatory Visit: Payer: Self-pay | Admitting: Family Medicine

## 2024-11-11 ENCOUNTER — Inpatient Hospital Stay
Admission: EM | Admit: 2024-11-11 | Discharge: 2024-11-13 | DRG: 308 | Disposition: A | Discharge status: Home / Self Care | Attending: Internal Medicine | Admitting: Internal Medicine

## 2024-11-11 ENCOUNTER — Encounter: Payer: Self-pay | Admitting: Oncology

## 2024-11-11 ENCOUNTER — Other Ambulatory Visit: Payer: Self-pay

## 2024-11-11 ENCOUNTER — Emergency Department

## 2024-11-11 DIAGNOSIS — M51369 Other intervertebral disc degeneration, lumbar region without mention of lumbar back pain or lower extremity pain: Secondary | ICD-10-CM | POA: Diagnosis present

## 2024-11-11 DIAGNOSIS — Z66 Do not resuscitate: Secondary | ICD-10-CM | POA: Diagnosis present

## 2024-11-11 DIAGNOSIS — A048 Other specified bacterial intestinal infections: Secondary | ICD-10-CM

## 2024-11-11 DIAGNOSIS — Z8249 Family history of ischemic heart disease and other diseases of the circulatory system: Secondary | ICD-10-CM

## 2024-11-11 DIAGNOSIS — M1990 Unspecified osteoarthritis, unspecified site: Secondary | ICD-10-CM | POA: Diagnosis present

## 2024-11-11 DIAGNOSIS — J209 Acute bronchitis, unspecified: Secondary | ICD-10-CM | POA: Diagnosis present

## 2024-11-11 DIAGNOSIS — J101 Influenza due to other identified influenza virus with other respiratory manifestations: Secondary | ICD-10-CM | POA: Insufficient documentation

## 2024-11-11 DIAGNOSIS — I110 Hypertensive heart disease with heart failure: Secondary | ICD-10-CM | POA: Diagnosis present

## 2024-11-11 DIAGNOSIS — Z85828 Personal history of other malignant neoplasm of skin: Secondary | ICD-10-CM

## 2024-11-11 DIAGNOSIS — J1001 Influenza due to other identified influenza virus with the same other identified influenza virus pneumonia: Secondary | ICD-10-CM | POA: Diagnosis present

## 2024-11-11 DIAGNOSIS — Z9861 Coronary angioplasty status: Secondary | ICD-10-CM

## 2024-11-11 DIAGNOSIS — Z7901 Long term (current) use of anticoagulants: Secondary | ICD-10-CM

## 2024-11-11 DIAGNOSIS — I5042 Chronic combined systolic (congestive) and diastolic (congestive) heart failure: Secondary | ICD-10-CM | POA: Diagnosis present

## 2024-11-11 DIAGNOSIS — Z7984 Long term (current) use of oral hypoglycemic drugs: Secondary | ICD-10-CM

## 2024-11-11 DIAGNOSIS — Z818 Family history of other mental and behavioral disorders: Secondary | ICD-10-CM

## 2024-11-11 DIAGNOSIS — Z9981 Dependence on supplemental oxygen: Secondary | ICD-10-CM

## 2024-11-11 DIAGNOSIS — Z87891 Personal history of nicotine dependence: Secondary | ICD-10-CM

## 2024-11-11 DIAGNOSIS — E119 Type 2 diabetes mellitus without complications: Secondary | ICD-10-CM | POA: Diagnosis present

## 2024-11-11 DIAGNOSIS — I25118 Atherosclerotic heart disease of native coronary artery with other forms of angina pectoris: Secondary | ICD-10-CM | POA: Diagnosis present

## 2024-11-11 DIAGNOSIS — Z79899 Other long term (current) drug therapy: Secondary | ICD-10-CM

## 2024-11-11 DIAGNOSIS — G4700 Insomnia, unspecified: Secondary | ICD-10-CM

## 2024-11-11 DIAGNOSIS — I4891 Unspecified atrial fibrillation: Secondary | ICD-10-CM | POA: Diagnosis present

## 2024-11-11 DIAGNOSIS — Z820 Family history of epilepsy and other diseases of the nervous system: Secondary | ICD-10-CM

## 2024-11-11 DIAGNOSIS — Z9049 Acquired absence of other specified parts of digestive tract: Secondary | ICD-10-CM

## 2024-11-11 DIAGNOSIS — G4730 Sleep apnea, unspecified: Secondary | ICD-10-CM | POA: Diagnosis present

## 2024-11-11 DIAGNOSIS — M25561 Pain in right knee: Principal | ICD-10-CM | POA: Diagnosis present

## 2024-11-11 DIAGNOSIS — Z825 Family history of asthma and other chronic lower respiratory diseases: Secondary | ICD-10-CM

## 2024-11-11 DIAGNOSIS — I480 Paroxysmal atrial fibrillation: Principal | ICD-10-CM | POA: Diagnosis present

## 2024-11-11 DIAGNOSIS — J440 Chronic obstructive pulmonary disease with acute lower respiratory infection: Secondary | ICD-10-CM | POA: Diagnosis present

## 2024-11-11 DIAGNOSIS — Z882 Allergy status to sulfonamides status: Secondary | ICD-10-CM

## 2024-11-11 DIAGNOSIS — I10 Essential (primary) hypertension: Secondary | ICD-10-CM

## 2024-11-11 DIAGNOSIS — Z833 Family history of diabetes mellitus: Secondary | ICD-10-CM

## 2024-11-11 DIAGNOSIS — Z888 Allergy status to other drugs, medicaments and biological substances status: Secondary | ICD-10-CM

## 2024-11-11 DIAGNOSIS — E785 Hyperlipidemia, unspecified: Secondary | ICD-10-CM | POA: Diagnosis present

## 2024-11-11 DIAGNOSIS — Z951 Presence of aortocoronary bypass graft: Secondary | ICD-10-CM

## 2024-11-11 DIAGNOSIS — Z885 Allergy status to narcotic agent status: Secondary | ICD-10-CM

## 2024-11-11 LAB — BASIC METABOLIC PANEL WITH GFR
Anion gap: 14 (ref 5–15)
BUN: 21 mg/dL (ref 8–23)
CO2: 20 mmol/L — ABNORMAL LOW (ref 22–32)
Calcium: 9.1 mg/dL (ref 8.9–10.3)
Chloride: 102 mmol/L (ref 98–111)
Creatinine, Ser: 0.84 mg/dL (ref 0.61–1.24)
GFR, Estimated: >60 mL/min
Glucose, Bld: 166 mg/dL — ABNORMAL HIGH (ref 70–99)
Potassium: 3.8 mmol/L (ref 3.5–5.1)
Sodium: 136 mmol/L (ref 135–145)

## 2024-11-11 LAB — CBC
HCT: 51.9 % (ref 39.0–52.0)
Hemoglobin: 17 g/dL (ref 13.0–17.0)
MCH: 30.4 pg (ref 26.0–34.0)
MCHC: 32.8 g/dL (ref 30.0–36.0)
MCV: 92.8 fL (ref 80.0–100.0)
Platelets: 154 K/uL (ref 150–400)
RBC: 5.59 MIL/uL (ref 4.22–5.81)
RDW: 16.4 % — ABNORMAL HIGH (ref 11.5–15.5)
WBC: 8.5 K/uL (ref 4.0–10.5)
nRBC: 0 % (ref 0.0–0.2)

## 2024-11-11 LAB — RESP PANEL BY RT-PCR (RSV, FLU A&B, COVID)  RVPGX2
Influenza A by PCR: POSITIVE — AB
Influenza B by PCR: NEGATIVE
Resp Syncytial Virus by PCR: NEGATIVE
SARS Coronavirus 2 by RT PCR: NEGATIVE

## 2024-11-11 LAB — CBG MONITORING, ED
Glucose-Capillary: 331 mg/dL — ABNORMAL HIGH (ref 70–99)
Glucose-Capillary: 272 mg/dL — ABNORMAL HIGH (ref 70–99)

## 2024-11-11 LAB — TROPONIN T, HIGH SENSITIVITY: Troponin T High Sensitivity: 23 ng/L — ABNORMAL HIGH (ref 0–19)

## 2024-11-11 MED ORDER — DILTIAZEM HCL-DEXTROSE 125-5 MG/125ML-% IV SOLN (PREMIX)
5.0000 mg/h | INTRAVENOUS | Status: DC
  Filled 2024-11-11: qty 125

## 2024-11-11 MED ORDER — BUDESON-GLYCOPYRROL-FORMOTEROL 160-9-4.8 MCG/ACT IN AERO
2.0000 | INHALATION_SPRAY | Freq: Two times a day (BID) | RESPIRATORY_TRACT | Status: DC
  Administered 2024-11-11 – 2024-11-13 (×4): 2 via RESPIRATORY_TRACT
  Filled 2024-11-11 (×2): qty 5.9

## 2024-11-11 MED ORDER — APIXABAN 5 MG PO TABS
5.0000 mg | ORAL_TABLET | Freq: Two times a day (BID) | ORAL | Status: DC
  Administered 2024-11-11 – 2024-11-13 (×4): 5 mg via ORAL
  Filled 2024-11-11 (×3): qty 1

## 2024-11-11 MED ORDER — METFORMIN HCL ER 500 MG PO TB24
500.0000 mg | ORAL_TABLET | Freq: Two times a day (BID) | ORAL | Status: DC
  Administered 2024-11-11 – 2024-11-13 (×4): 500 mg via ORAL
  Filled 2024-11-11 (×5): qty 1

## 2024-11-11 MED ORDER — DIGOXIN 0.25 MG/ML IJ SOLN
0.1250 mg | Freq: Once | INTRAMUSCULAR | Status: DC
  Filled 2024-11-11: qty 2

## 2024-11-11 MED ORDER — ALLOPURINOL 100 MG PO TABS
300.0000 mg | ORAL_TABLET | Freq: Two times a day (BID) | ORAL | Status: DC
  Administered 2024-11-11 – 2024-11-13 (×4): 300 mg via ORAL
  Filled 2024-11-11: qty 3
  Filled 2024-11-11 (×4): qty 1

## 2024-11-11 MED ORDER — LORATADINE 10 MG PO TABS
10.0000 mg | ORAL_TABLET | Freq: Every day | ORAL | Status: DC
  Administered 2024-11-11 – 2024-11-13 (×3): 10 mg via ORAL
  Filled 2024-11-11 (×2): qty 1

## 2024-11-11 MED ORDER — OSELTAMIVIR PHOSPHATE 75 MG PO CAPS
75.0000 mg | ORAL_CAPSULE | Freq: Once | ORAL | Status: AC
  Administered 2024-11-11: 75 mg via ORAL
  Filled 2024-11-11: qty 1

## 2024-11-11 MED ORDER — OXYCODONE-ACETAMINOPHEN 5-325 MG PO TABS
2.0000 | ORAL_TABLET | Freq: Once | ORAL | Status: AC
  Administered 2024-11-11: 2 via ORAL
  Filled 2024-11-11: qty 2

## 2024-11-11 MED ORDER — ONDANSETRON HCL 4 MG PO TABS: 4.0000 mg | ORAL_TABLET | Freq: Four times a day (QID) | ORAL | Status: DC | PRN

## 2024-11-11 MED ORDER — OXYCODONE HCL 5 MG PO TABS
5.0000 mg | ORAL_TABLET | Freq: Three times a day (TID) | ORAL | Status: DC | PRN
  Administered 2024-11-11: 5 mg via ORAL
  Filled 2024-11-11: qty 1

## 2024-11-11 MED ORDER — DIGOXIN 0.25 MG/ML IJ SOLN
0.1250 mg | Freq: Once | INTRAMUSCULAR | Status: AC
  Administered 2024-11-11: 0.125 mg via INTRAVENOUS
  Filled 2024-11-11 (×2): qty 2

## 2024-11-11 MED ORDER — ACETAMINOPHEN 325 MG PO TABS: 650.0000 mg | ORAL_TABLET | Freq: Four times a day (QID) | ORAL | Status: DC | PRN

## 2024-11-11 MED ORDER — TRAZODONE HCL 50 MG PO TABS
150.0000 mg | ORAL_TABLET | Freq: Every evening | ORAL | Status: DC | PRN
  Administered 2024-11-12: 150 mg via ORAL
  Filled 2024-11-11: qty 1

## 2024-11-11 MED ORDER — METOPROLOL TARTRATE 5 MG/5ML IV SOLN
5.0000 mg | Freq: Once | INTRAVENOUS | Status: AC
  Administered 2024-11-11: 5 mg via INTRAVENOUS
  Filled 2024-11-11: qty 5

## 2024-11-11 MED ORDER — IPRATROPIUM-ALBUTEROL 0.5-2.5 (3) MG/3ML IN SOLN
3.0000 mL | Freq: Once | RESPIRATORY_TRACT | Status: AC
  Administered 2024-11-11: 3 mL via RESPIRATORY_TRACT
  Filled 2024-11-11: qty 3

## 2024-11-11 MED ORDER — PREDNISONE 20 MG PO TABS
60.0000 mg | ORAL_TABLET | Freq: Once | ORAL | Status: AC
  Administered 2024-11-11: 60 mg via ORAL
  Filled 2024-11-11: qty 3

## 2024-11-11 MED ORDER — ONDANSETRON HCL 4 MG/2ML IJ SOLN: 4.0000 mg | Freq: Four times a day (QID) | INTRAMUSCULAR | Status: DC | PRN

## 2024-11-11 MED ORDER — ATORVASTATIN CALCIUM 80 MG PO TABS
80.0000 mg | ORAL_TABLET | Freq: Every day | ORAL | Status: DC
  Administered 2024-11-11 – 2024-11-12 (×2): 80 mg via ORAL
  Filled 2024-11-11: qty 1
  Filled 2024-11-11: qty 4

## 2024-11-11 MED ORDER — RANOLAZINE ER 500 MG PO TB12: 1000.0000 mg | ORAL_TABLET | Freq: Two times a day (BID) | ORAL | Status: DC

## 2024-11-11 MED ORDER — SACUBITRIL-VALSARTAN 49-51 MG PO TABS
1.0000 | ORAL_TABLET | Freq: Two times a day (BID) | ORAL | Status: DC
  Administered 2024-11-12 – 2024-11-13 (×3): 1 via ORAL
  Filled 2024-11-11 (×3): qty 1

## 2024-11-11 MED ORDER — INSULIN ASPART 100 UNIT/ML IJ SOLN
0.0000 [IU] | Freq: Every day | INTRAMUSCULAR | Status: DC
  Administered 2024-11-11: 3 [IU] via SUBCUTANEOUS
  Filled 2024-11-11: qty 3

## 2024-11-11 MED ORDER — METOPROLOL SUCCINATE ER 25 MG PO TB24
37.5000 mg | ORAL_TABLET | Freq: Every day | ORAL | Status: DC
  Administered 2024-11-12 – 2024-11-13 (×2): 37.5 mg via ORAL
  Filled 2024-11-11: qty 2

## 2024-11-11 MED ORDER — ONDANSETRON HCL 4 MG/2ML IJ SOLN
4.0000 mg | Freq: Once | INTRAMUSCULAR | Status: AC
  Administered 2024-11-11: 4 mg via INTRAVENOUS
  Filled 2024-11-11: qty 2

## 2024-11-11 MED ORDER — ACETAMINOPHEN 650 MG RE SUPP: 650.0000 mg | Freq: Four times a day (QID) | RECTAL | Status: DC | PRN

## 2024-11-11 MED ORDER — ALPRAZOLAM 0.5 MG PO TABS
1.0000 mg | ORAL_TABLET | Freq: Three times a day (TID) | ORAL | Status: DC | PRN
  Administered 2024-11-12 – 2024-11-13 (×4): 1 mg via ORAL
  Filled 2024-11-11 (×4): qty 2

## 2024-11-11 MED ORDER — INSULIN ASPART 100 UNIT/ML IJ SOLN
0.0000 [IU] | Freq: Three times a day (TID) | INTRAMUSCULAR | Status: DC
  Administered 2024-11-11: 11 [IU] via SUBCUTANEOUS
  Administered 2024-11-12: 3 [IU] via SUBCUTANEOUS
  Administered 2024-11-12: 5 [IU] via SUBCUTANEOUS
  Administered 2024-11-13: 3 [IU] via SUBCUTANEOUS
  Administered 2024-11-13: 2 [IU] via SUBCUTANEOUS
  Filled 2024-11-11: qty 3
  Filled 2024-11-11: qty 5
  Filled 2024-11-11: qty 11

## 2024-11-11 MED ORDER — IPRATROPIUM BROMIDE 0.02 % IN SOLN
0.5000 mg | Freq: Four times a day (QID) | RESPIRATORY_TRACT | Status: DC
  Administered 2024-11-11 – 2024-11-13 (×7): 0.5 mg via RESPIRATORY_TRACT
  Filled 2024-11-11 (×5): qty 2.5

## 2024-11-11 MED ORDER — LEVALBUTEROL HCL 0.63 MG/3ML IN NEBU: 0.6300 mg | INHALATION_SOLUTION | Freq: Four times a day (QID) | RESPIRATORY_TRACT | Status: DC | PRN

## 2024-11-11 MED ORDER — OXYCODONE-ACETAMINOPHEN 5-325 MG PO TABS
2.0000 | ORAL_TABLET | ORAL | Status: AC | PRN
  Administered 2024-11-11 – 2024-11-12 (×3): 2 via ORAL
  Filled 2024-11-11 (×3): qty 2

## 2024-11-11 MED ORDER — METHOCARBAMOL 500 MG PO TABS
500.0000 mg | ORAL_TABLET | Freq: Three times a day (TID) | ORAL | Status: DC | PRN
  Administered 2024-11-12: 500 mg via ORAL
  Filled 2024-11-11: qty 1

## 2024-11-11 MED ORDER — METOPROLOL TARTRATE 5 MG/5ML IV SOLN
5.0000 mg | INTRAVENOUS | Status: DC | PRN
  Administered 2024-11-11: 5 mg via INTRAVENOUS
  Filled 2024-11-11: qty 5

## 2024-11-11 MED ORDER — PANTOPRAZOLE SODIUM 40 MG PO TBEC
40.0000 mg | DELAYED_RELEASE_TABLET | Freq: Every day | ORAL | Status: DC
  Administered 2024-11-11 – 2024-11-13 (×3): 40 mg via ORAL
  Filled 2024-11-11 (×2): qty 1

## 2024-11-11 MED ORDER — OSELTAMIVIR PHOSPHATE 75 MG PO CAPS
75.0000 mg | ORAL_CAPSULE | Freq: Two times a day (BID) | ORAL | Status: DC
  Administered 2024-11-11 – 2024-11-13 (×4): 75 mg via ORAL
  Filled 2024-11-11 (×4): qty 1

## 2024-11-11 MED ORDER — DAPAGLIFLOZIN PROPANEDIOL 10 MG PO TABS
10.0000 mg | ORAL_TABLET | Freq: Every day | ORAL | Status: DC
  Administered 2024-11-11 – 2024-11-13 (×3): 10 mg via ORAL
  Filled 2024-11-11 (×2): qty 1

## 2024-11-11 MED ORDER — FAMOTIDINE 20 MG PO TABS
20.0000 mg | ORAL_TABLET | Freq: Two times a day (BID) | ORAL | Status: DC
  Administered 2024-11-11 – 2024-11-13 (×4): 20 mg via ORAL
  Filled 2024-11-11 (×3): qty 1

## 2024-11-11 MED ORDER — PREDNISONE 20 MG PO TABS
40.0000 mg | ORAL_TABLET | Freq: Every day | ORAL | Status: DC
  Administered 2024-11-12 – 2024-11-13 (×2): 40 mg via ORAL
  Filled 2024-11-11: qty 2

## 2024-11-11 NOTE — ED Provider Notes (Signed)
 "  [facility] Provider Note    Event Date/Time   First MD Initiated Contact with Patient 11/11/24 1109     (approximate)   History   Shortness of Breath and Knee Pain   HPI  Lawrence [name] is a 68 y.o. male with an extensive cardiac history including CAD with prior CABG, atrial fibrillation on Eliquis , hyperlipidemia, stroke, and COPD on 2 L O2 as needed who presents with shortness of breath, acute onset last night, persistent course, associated with cough and congestion.  He also reports body aches and generalized weakness.  He states that when he tried to stand up this morning, his right knee popped, and since then he has had difficulty bearing weight on it.  He normally walks with a rollator.  He states he is on 2 L oxygen  as needed but has not required it this morning.  He reports some chest pain but this is chronic.  I reviewed the past medical records.  The patient has had numerous prior ED visits as well as numerous admissions for chest pain.  He was most recently admitted to the hospital service between 12/16 and 12/18 due to chest pain.  He was subsequently seen in the ED and discharged on 12/23 with epigastric abdominal pain.   Physical Exam   Triage Vital Signs: ED Triage Vitals  Encounter Vitals Group     BP 11/11/24 1053 (!) 141/88     Girls Systolic BP Percentile --      Girls Diastolic BP Percentile --      Boys Systolic BP Percentile --      Boys Diastolic BP Percentile --      Pulse Rate 11/11/24 1053 (!) 56     Resp 11/11/24 1053 18     Temp 11/11/24 1053 98.6 F (37 C)     Temp Source 11/11/24 1053 Oral     SpO2 11/11/24 1053 97 %     Weight --      Height --      Head Circumference --      Peak Flow --      Pain Score 11/11/24 1052 9     Pain Loc --      Pain Education --      Exclude from Growth Chart --     Most recent vital signs: Vitals:   11/11/24 1400 11/11/24 1430  BP: 119/83 110/88  Pulse: 65 (!) 112  Resp: (!)  22 17  Temp:    SpO2: 91% 92%     General: Alert, relatively well-appearing, no distress.  CV:  Good peripheral perfusion.  Resp:  Normal effort.  Diminished breath sounds bilaterally. Abd:  No distention.  Other:  No peripheral edema.   ED Results / Procedures / Treatments   Labs (all labs ordered are listed, but only abnormal results are displayed) Labs Reviewed  RESP PANEL BY RT-PCR (RSV, FLU A&B, COVID)  RVPGX2 - Abnormal; Notable for the following components:      Result Value   Influenza A by PCR POSITIVE (*)    All other components within normal limits  BASIC METABOLIC PANEL WITH GFR - Abnormal; Notable for the following components:   CO2 20 (*)    Glucose, Bld 166 (*)    All other components within normal limits  CBC - Abnormal; Notable for the following components:   RDW 16.4 (*)    All other components within normal limits  TROPONIN T, HIGH SENSITIVITY -  Abnormal; Notable for the following components:   Troponin T High Sensitivity 23 (*)    All other components within normal limits     EKG  ED ECG REPORT I, Waylon Cassis, the attending physician, personally viewed and interpreted this ECG.  Date: 11/11/2024 EKG Time: 1211 Rate: 128 Rhythm: Interval ablation with RVR QRS Axis: normal Intervals: normal ST/T Wave abnormalities: None specific ST abnormalities Narrative Interpretation: no evidence of acute ischemia; no significant change when compared to EKG of 11/07/2024    RADIOLOGY  Chest x-ray: I independently viewed and interpreted the images; there is no focal consolidation or edema  XR R knee: No acute fracture  PROCEDURES:  Critical Care performed: No  Procedures   MEDICATIONS ORDERED IN ED: Medications  metoprolol  tartrate (LOPRESSOR ) injection 5 mg (has no administration in time range)  allopurinol  (ZYLOPRIM ) tablet 300 mg (has no administration in time range)  oxyCODONE  (Oxy IR/ROXICODONE ) immediate release tablet 5 mg (has no  administration in time range)  atorvastatin  (LIPITOR ) tablet 80 mg (has no administration in time range)  metoprolol  succinate (TOPROL -XL) 24 hr tablet 37.5 mg (has no administration in time range)  ranolazine  (RANEXA ) 12 hr tablet 1,000 mg (has no administration in time range)  sacubitril -valsartan  (ENTRESTO ) 49-51 mg per tablet (has no administration in time range)  ALPRAZolam  (XANAX ) tablet 1 mg (has no administration in time range)  traZODone  (DESYREL ) tablet 150 mg (has no administration in time range)  dapagliflozin  propanediol (FARXIGA ) tablet 10 mg (has no administration in time range)  metFORMIN  (GLUCOPHAGE -XR) 24 hr tablet 500 mg (has no administration in time range)  famotidine  (PEPCID ) tablet 20 mg (has no administration in time range)  pantoprazole  (PROTONIX ) EC tablet 40 mg (has no administration in time range)  apixaban  (ELIQUIS ) tablet 5 mg (has no administration in time range)  methocarbamol  (ROBAXIN ) tablet 500 mg (has no administration in time range)  budesonide -glycopyrrolate -formoterol  (BREZTRI ) 160-9-4.8 MCG/ACT inhaler 2 puff (has no administration in time range)  loratadine  (CLARITIN ) tablet 10 mg (has no administration in time range)  oseltamivir  (TAMIFLU ) capsule 75 mg (has no administration in time range)  digoxin  (LANOXIN ) 0.25 MG/ML injection 0.125 mg (has no administration in time range)  ipratropium (ATROVENT ) nebulizer solution 0.5 mg (has no administration in time range)  levalbuterol  (XOPENEX ) nebulizer solution 0.63 mg (has no administration in time range)  predniSONE  (DELTASONE ) tablet 40 mg (has no administration in time range)  acetaminophen  (TYLENOL ) tablet 650 mg (has no administration in time range)    Or  acetaminophen  (TYLENOL ) suppository 650 mg (has no administration in time range)  ondansetron  (ZOFRAN ) tablet 4 mg (has no administration in time range)    Or  ondansetron  (ZOFRAN ) injection 4 mg (has no administration in time range)  insulin   aspart (novoLOG ) injection 0-15 Units (has no administration in time range)  insulin  aspart (novoLOG ) injection 0-5 Units (has no administration in time range)  oxyCODONE -acetaminophen  (PERCOCET/ROXICET) 5-325 MG per tablet 2 tablet (2 tablets Oral Given 11/11/24 1258)  ipratropium-albuterol  (DUONEB) 0.5-2.5 (3) MG/3ML nebulizer solution 3 mL (3 mLs Nebulization Given 11/11/24 1300)  ipratropium-albuterol  (DUONEB) 0.5-2.5 (3) MG/3ML nebulizer solution 3 mL (3 mLs Nebulization Given 11/11/24 1300)  predniSONE  (DELTASONE ) tablet 60 mg (60 mg Oral Given 11/11/24 1259)  metoprolol  tartrate (LOPRESSOR ) injection 5 mg (5 mg Intravenous Given 11/11/24 1331)  ondansetron  (ZOFRAN ) injection 4 mg (4 mg Intravenous Given 11/11/24 1401)     IMPRESSION / MDM / ASSESSMENT AND PLAN / ED COURSE  I reviewed the triage  vital signs and the nursing notes.  68 year old male with PMH as noted above presents with worsening shortness of breath since last night along with cough, congestion, and bodyaches; he also reports acute onset of right knee pain with no direct trauma.  On exam, his vital signs are normal except for borderline tachycardia.  Physical exam is otherwise unremarkable for acute findings.  EKG shows atrial fibrillation with RVR.    Differential diagnosis includes, but is not limited to, COPD exacerbation, COVID or other viral syndrome, acute bronchitis, pneumonia, new onset CHF, less likely ACS.  We will obtain chest x-ray, basic labs, cardiac enzymes, and reassess.  Patient's presentation is most consistent with acute presentation with potential threat to life or bodily function.  The patient is on the cardiac monitor to evaluate for evidence of arrhythmia and/or significant heart rate changes.  ----------------------------------------- 3:00 PM on 11/11/2024 -----------------------------------------  Respiratory panel was positive for influenza A.  Other labs are unremarkable.  Troponin is  consistent with the patient's baseline.  BMP and CBC showed no acute findings.  X-rays of the chest and right knee are negative for acute findings.  However, the patient went into rapid atrial fibrillation initially with a rate in the 180s.  The patient did take his metoprolol  at home before coming to the hospital.  I gave IV metoprolol  and the rate is now in the 140s to 150s.  I have ordered a Cardizem  infusion.  The patient will need admission for further management.  I consulted Dr. Laurita from the hospitalist service; based on our discussion he agrees to evaluate the patient for admission.  FINAL CLINICAL IMPRESSION(S) / ED DIAGNOSES   Final diagnoses:  Acute pain of right knee  Influenza A  Atrial fibrillation with RVR (HCC)     Rx / DC Orders   ED Discharge Orders     None        Note:  This document was prepared using Dragon voice recognition software and may include unintentional dictation errors.    Jacolyn Pae, MD 11/11/24 1502  "

## 2024-11-11 NOTE — ED Notes (Addendum)
 Notified NP Donati-Garmon of pt chest pain and request for percocet 10-325 Q4 per his home meds.  Also requested PRN nitroglycerin  per pt home med list.

## 2024-11-11 NOTE — ED Notes (Signed)
 Provided pt with 2 packs of graham crackers per his request.

## 2024-11-11 NOTE — H&P (Signed)
 " History and Physical    Lawrence [name] FMW:[id number] DOB: 1955-12-25 DOA: 11/11/2024  PCP: Lawrence Nancyann BRAVO, MD (Confirm with patient/family/NH records and if not entered, this has to be entered at [facility] point of entry) Patient coming from: Home   I have personally briefly reviewed patient's old medical records in [facility]  Chief Complaint: Cough, wheezing, muscle pain, abd pain  HPI: Lawrence [name] is a 68 y.o. male with medical history significant of CAD stable angina, status post stenting, CABG in 2016, PAF on Eliquis , PSVT/frequent PVCs, HTN, IIDM, chronic HFrEF with LVEF 35-40%, presented with multiple complaints including new onset of cycles of fever chills, dry cough wheezing muscle aching and abdominal pain.  Symptoms started 2 days ago when patient started to have sore throat dry cough and wheezing increasing shortness of breath.  Yesterday he also started to feel palpitations and whole body aching and cramping like abdominal pain but denied any nauseous vomiting or diarrhea.  ED Course: Afebrile, heart rate 110-150s, blood pressure 140/88 observation Presented well.  Chest x-ray negative for acute infiltrates, blood work showed WBC 8.5 hemoglobin 11.0.  Bicarb 20 nasal swab positive for influenza A.  Patient was given p.o. prednisone  x 1, DuoNebs x 2 in the ED.  Review of Systems: As per HPI otherwise 14 point review of systems negative.    Past Medical History:  Diagnosis Date   A-fib (HCC)    Anemia    Anginal pain    Anxiety    Arthritis    Asthma    Atrial fibrillation, chronic (HCC) 10/19/2023   CAD (coronary artery disease)    a. 2002 CABGx2 (LIMA->LAD, VG->VG->OM1);  b. 09/2012 DES->OM;  c. 03/2015 PTCA of LAD [facility]) in setting of atretic LIMA; d. 05/2015 Cath [facility]): nonobs dzs; e. 06/2015 Cath ([facility]): LM nl, LAD 45p/d ISR, 50d, D1/2 small, LCX 50p/d ISR, OM1 70ost, 30 ISR, VG->OM1 50ost, 58m, LIMA->LAD 99p/d - atretic, RCA dom, nl; f.cath 10/16: 40-50%(FFR  0.90) pLAD, 75% (FFR 0.77) mLAD s/p PCI/DES, oRCA 40% (FFR0.95)   Cancer (HCC)    SKIN CANCER ON BACK   Celiac disease    Chronic diastolic CHF (congestive heart failure) (HCC)    a. 06/2009 Echo: EF 60-65%, Gr 1 DD, triv AI, mildly dil LA, nl RV.   COPD (chronic obstructive pulmonary disease) (HCC)    a. Chronic bronchitis and emphysema.   DDD (degenerative disc disease), lumbar    Diverticulosis    Dysrhythmia    Essential hypertension    GERD (gastroesophageal reflux disease)    History of hiatal hernia    History of kidney stones    H/O   History of tobacco abuse    a. Quit 2014.   Myocardial infarction [facility]) 2002   4 STENTS   Pancreatitis    PSVT (paroxysmal supraventricular tachycardia)    a. 10/2012 Noted on Zio Patch.   RLL pneumonia 05/30/2024   Sleep apnea    LOST CORD TO CPAP -ONLY 02 @ BEDTIME   Tubular adenoma of colon    Type II diabetes mellitus (HCC)     Past Surgical History:  Procedure Laterality Date   BYPASS GRAFT     CARDIAC CATHETERIZATION N/A 07/12/2015   rocedure: Left Heart Cath and Cors/Grafts Angiography;  Surgeon: Victory LELON Sharps, MD;  Location: [facility] INVASIVE CV LAB;  Service: Cardiovascular;  Laterality: N/A;   CARDIAC CATHETERIZATION Right 10/07/2015   Procedure: Left Heart Cath and Cors/Grafts Angiography;  Surgeon:  Denyse DELENA Bathe, MD;  Location: [facility] INVASIVE CV LAB;  Service: Cardiovascular;  Laterality: Right;   CARDIAC CATHETERIZATION N/A 04/06/2016   Procedure: Left Heart Cath and Coronary Angiography;  Surgeon: Cara JONETTA Lovelace, MD;  Location: [facility] INVASIVE CV LAB;  Service: Cardiovascular;  Laterality: N/A;   CARDIAC CATHETERIZATION  04/06/2016   Procedure: Bypass Graft Angiography;  Surgeon: Cara JONETTA Lovelace, MD;  Location: [facility] INVASIVE CV LAB;  Service: Cardiovascular;;   CARDIAC CATHETERIZATION N/A 11/02/2016   Procedure: Left Heart Cath and Cors/Grafts Angiography and possible PCI;  Surgeon: Cara JONETTA Lovelace, MD;  Location: [facility] INVASIVE  CV LAB;  Service: Cardiovascular;  Laterality: N/A;   CARDIAC CATHETERIZATION N/A 11/02/2016   Procedure: Coronary Stent Intervention;  Surgeon: Cara JONETTA Lovelace, MD;  Location: [facility] INVASIVE CV LAB;  Service: Cardiovascular;  Laterality: N/A;   CARDIOVERSION N/A 06/29/2022   Procedure: CARDIOVERSION;  Surgeon: Hester Wolm PARAS, MD;  Location: [facility] ORS;  Service: Cardiovascular;  Laterality: N/A;   CHOLECYSTECTOMY     CIRCUMCISION N/A 06/09/2019   Procedure: CIRCUMCISION ADULT;  Surgeon: Francisca Redell BROCKS, MD;  Location: [facility] ORS;  Service: Urology;  Laterality: N/A;   COLONOSCOPY WITH PROPOFOL  N/A 04/01/2018   Procedure: COLONOSCOPY WITH PROPOFOL ;  Surgeon: Viktoria Lamar DASEN, MD;  Location: [facility] ENDOSCOPY;  Service: Endoscopy;  Laterality: N/A;   COLONOSCOPY WITH PROPOFOL  N/A 05/01/2023   Procedure: COLONOSCOPY WITH PROPOFOL ;  Surgeon: Unk Corinn Skiff, MD;  Location: [facility] ENDOSCOPY;  Service: Gastroenterology;  Laterality: N/A;   ESOPHAGEAL DILATION     ESOPHAGOGASTRODUODENOSCOPY (EGD) WITH PROPOFOL  N/A 04/01/2018   Procedure: ESOPHAGOGASTRODUODENOSCOPY (EGD) WITH PROPOFOL ;  Surgeon: Viktoria Lamar DASEN, MD;  Location: [facility] ENDOSCOPY;  Service: Endoscopy;  Laterality: N/A;   ESOPHAGOGASTRODUODENOSCOPY (EGD) WITH PROPOFOL  N/A 05/01/2023   Procedure: ESOPHAGOGASTRODUODENOSCOPY (EGD) WITH PROPOFOL ;  Surgeon: Unk Corinn Skiff, MD;  Location: [facility] ENDOSCOPY;  Service: Gastroenterology;  Laterality: N/A;   GIVENS CAPSULE STUDY  05/01/2023   Procedure: GIVENS CAPSULE STUDY;  Surgeon: Unk Corinn Skiff, MD;  Location: [facility] ENDOSCOPY;  Service: Gastroenterology;;   LEFT HEART CATH AND CORS/GRAFTS ANGIOGRAPHY N/A 06/12/2019   Procedure: LEFT HEART CATH AND CORS/GRAFTS ANGIOGRAPHY;  Surgeon: Bosie Vinie DELENA, MD;  Location: [facility] INVASIVE CV LAB;  Service: Cardiovascular;  Laterality: N/A;   LEFT HEART CATH AND CORS/GRAFTS ANGIOGRAPHY N/A 03/11/2020   Procedure: LEFT HEART CATH AND CORS/GRAFTS ANGIOGRAPHY;   Surgeon: Ammon Blunt, MD;  Location: [facility] INVASIVE CV LAB;  Service: Cardiovascular;  Laterality: N/A;   LEFT HEART CATH AND CORS/GRAFTS ANGIOGRAPHY N/A 05/01/2021   Procedure: LEFT HEART CATH AND CORS/GRAFTS ANGIOGRAPHY;  Surgeon: Hester Wolm PARAS, MD;  Location: [facility] INVASIVE CV LAB;  Service: Cardiovascular;  Laterality: N/A;   LEFT HEART CATH AND CORS/GRAFTS ANGIOGRAPHY N/A 11/02/2022   Procedure: LEFT HEART CATH AND CORS/GRAFTS ANGIOGRAPHY;  Surgeon: Lovelace Cara JONETTA, MD;  Location: [facility] INVASIVE CV LAB;  Service: Cardiovascular;  Laterality: N/A;   TONSILLECTOMY     VASCULAR SURGERY       reports that he quit smoking about 11 years ago. His smoking use included cigarettes. He started smoking about 62 years ago. He has a 102 pack-year smoking history. He has never used smokeless tobacco. He reports that he does not currently use drugs after having used the following drugs: Marijuana. He reports that he does not drink alcohol.  Allergies[1]  Family History  Problem Relation Age of Onset   Heart attack Mother    Depression Mother    Heart disease Mother  COPD Mother    Hypertension Mother    Heart attack Father    Diabetes Father    Depression Father    Heart disease Father    Cirrhosis Father    Parkinson's disease Brother      Prior to Admission medications  Medication Sig Start Date End Date Taking? Authorizing Provider  Accu-Chek Softclix Lancets lancets Use as instructed to check sugar daily for type 2 diabetes. 03/03/21   Lawrence Nancyann BRAVO, MD  acetaminophen  (TYLENOL ) 325 MG tablet Take 2 tablets (650 mg total) by mouth every 6 (six) hours as needed for mild pain (pain score 1-3) or fever (or Fever >/= 101). 08/15/24   Dorinda Drue DASEN, MD  albuterol  (PROVENTIL ) (2.5 MG/3ML) 0.083% nebulizer solution Take 3 mLs (2.5 mg total) by nebulization every 6 (six) hours as needed for wheezing or shortness of breath. 05/26/24   Lawrence Nancyann BRAVO, MD  allopurinol  (ZYLOPRIM ) 300  MG tablet TAKE 1 TABLET BY MOUTH TWICE A DAY 04/12/24   Lawrence Nancyann BRAVO, MD  ALPRAZolam  (XANAX ) 1 MG tablet TAKE 1 TABLET BY MOUTH 3 TIMES A DAY AS NEEDED 09/16/24   Lawrence Nancyann BRAVO, MD  apixaban  (ELIQUIS ) 5 MG TABS tablet Take 1 tablet (5 mg total) by mouth 2 (two) times daily. 10/04/23   Lawrence Nancyann BRAVO, MD  atorvastatin  (LIPITOR ) 80 MG tablet TAKE 1 TABLET BY MOUTH AT BEDTIME 07/15/23   Lawrence Nancyann BRAVO, MD  Blood Glucose Monitoring Suppl (ACCU-CHEK GUIDE) w/Device KIT Use to check blood sugars as directed 12/01/23   Lawrence Nancyann BRAVO, MD  Blood Pressure Monitor DEVI Use to check blood pressure daily 07/27/24   Lawrence Nancyann BRAVO, MD  BREZTRI  AEROSPHERE 160-9-4.8 MCG/ACT AERO inhaler INHALE 2 PUFFS BY MOUTH INTO THE LUNGS 2TIMES DAILY 06/05/24   Lawrence Nancyann BRAVO, MD  cetirizine  (ZYRTEC ) 10 MG tablet Take 1 tablet (10 mg total) by mouth at bedtime. 03/31/24   Lawrence Nancyann BRAVO, MD  cholecalciferol  (VITAMIN D3) 25 MCG (1000 UNIT) tablet Take 1,000 Units by mouth daily.    [provider]  Cyanocobalamin  (VITAMIN B-12) 1000 MCG SUBL Place 1 tablet under the tongue daily.    [provider]  dapagliflozin  propanediol (FARXIGA ) 10 MG TABS tablet TAKE 1 TABLET BY MOUTH DAILY 03/28/24   Lawrence Nancyann BRAVO, MD  famotidine  (PEPCID ) 20 MG tablet TAKE 1 TABLET BY MOUTH TWICE A DAY 08/23/24   Lawrence Nancyann BRAVO, MD  glucose blood (ACCU-CHEK GUIDE) test strip Use as instructed to check sugar daily for type 2 diabetes. 03/03/21   Lawrence Nancyann BRAVO, MD  iron  polysaccharides (FERREX 150) 150 MG capsule Take one capsul every Monday, Wednesday, and Friday 06/09/24   Lawrence Nancyann BRAVO, MD  metFORMIN  (GLUCOPHAGE -XR) 500 MG 24 hr tablet TAKE 1 TABLET BY MOUTH DAILY WITH EVENING MEAL 07/11/24   Lawrence Nancyann BRAVO, MD  methocarbamol  (ROBAXIN ) 500 MG tablet TAKE 1 TABLET BY MOUTH EVERY 8 HOURS AS NEEDED FOR MUSCLE SPASMS 10/23/24   Lawrence Nancyann BRAVO, MD  metoprolol  succinate (TOPROL -XL) 25 MG 24 hr tablet Take 1.5 tablets  (37.5 mg total) by mouth daily. 10/09/24   Dorinda Drue DASEN, MD  nitroGLYCERIN  (NITROSTAT ) 0.4 MG SL tablet Place 1 tablet (0.4 mg total) under the tongue every 5 (five) minutes as needed for chest pain (Do not take if blood pressure is low, systolic BP<110). 10/21/23   [name], MD  omega-3 acid ethyl esters (LOVAZA ) 1 g capsule TAKE 4 CAPSULES (4 GRAMS TOTAL) BY  MOUTHDAILY 03/02/24   Lawrence Nancyann BRAVO, MD  oxyCODONE  (ROXICODONE ) 5 MG immediate release tablet Take 1 tablet (5 mg total) by mouth every 8 (eight) hours as needed. 11/02/24 11/02/25  Jhonny Calvin NOVAK, MD  oxyCODONE -acetaminophen  (PERCOCET) 10-325 MG tablet TAKE 1 TABLET BY MOUTH EVERY 4 HOURS AS NEEDED FOR PAIN 11/10/24   Lawrence Nancyann BRAVO, MD  OXYGEN  Inhale 2 L into the lungs at bedtime as needed (for shortness of breath).    [provider]  pantoprazole  (PROTONIX ) 40 MG tablet TAKE 1 TABLET BY MOUTH 2 TIMES DAILY (30MINUTES BEFORE A MEAL) 01/12/24   Lawrence Nancyann BRAVO, MD  ranolazine  (RANEXA ) 1000 MG SR tablet Take 1 tablet (1,000 mg total) by mouth 2 (two) times daily. 08/15/24   Dorinda Drue DASEN, MD  sacubitril -valsartan  (ENTRESTO ) 49-51 MG Take 1 tablet by mouth 2 (two) times daily. 07/27/24   Lawrence Nancyann BRAVO, MD  traZODone  (DESYREL ) 150 MG tablet TAKE 1 TABLET BY MOUTH AT BEDTIME AS NEEDED FOR SLEEP. 09/10/24   Lawrence Nancyann BRAVO, MD    Physical Exam: Vitals:   11/11/24 1300 11/11/24 1330 11/11/24 1400 11/11/24 1430  BP: (!) 149/112 (!) 156/145 119/83 110/88  Pulse: (!) 132 88 65 (!) 112  Resp: 18 (!) 28 (!) 22 17  Temp:      TempSrc:      SpO2: 94% 97% 91% 92%    Constitutional: NAD, calm, comfortable Vitals:   11/11/24 1300 11/11/24 1330 11/11/24 1400 11/11/24 1430  BP: (!) 149/112 (!) 156/145 119/83 110/88  Pulse: (!) 132 88 65 (!) 112  Resp: 18 (!) 28 (!) 22 17  Temp:      TempSrc:      SpO2: 94% 97% 91% 92%   Eyes: PERRL, lids and conjunctivae normal ENMT: Mucous membranes are moist. Posterior pharynx  clear of any exudate or lesions.Normal dentition.  Neck: normal, supple, no masses, no thyromegaly Respiratory: Diminished breathing sound bilaterally, diffuse wheezing, no crackles, increasing respiratory effort. No accessory muscle use.  Cardiovascular: Tachycardia, no murmurs / rubs / gallops. No extremity edema. 2+ pedal pulses. No carotid bruits.  Abdomen: no tenderness, no masses palpated. No hepatosplenomegaly. Bowel sounds positive.  Musculoskeletal: no clubbing / cyanosis. No joint deformity upper and lower extremities. Good ROM, no contractures. Normal muscle tone.  Skin: no rashes, lesions, ulcers. No induration Neurologic: CN 2-12 grossly intact. Sensation intact, DTR normal. Strength 5/5 in all 4.  Psychiatric: Normal judgment and insight. Alert and oriented x 3. Normal mood.     Labs on Admission: I have personally reviewed following labs and imaging studies  CBC: Recent Labs  Lab 11/06/24 2136 11/11/24 1054  WBC 6.0 8.5  HGB 17.6* 17.0  HCT 53.7* 51.9  MCV 92.3 92.8  PLT 166 154   Basic Metabolic Panel: Recent Labs  Lab 11/06/24 2136 11/11/24 1054  NA 141 136  K 3.6 3.8  CL 101 102  CO2 30 20*  GLUCOSE 253* 166*  BUN 18 21  CREATININE 0.91 0.84  CALCIUM  9.5 9.1   GFR: Estimated Creatinine Clearance: 76.5 mL/min (by C-G formula based on SCr of 0.84 mg/dL). Liver Function Tests: Recent Labs  Lab 11/06/24 2136  AST 15  ALT 21  ALKPHOS 66  BILITOT 0.5  PROT 6.0*  ALBUMIN 3.9   Recent Labs  Lab 11/06/24 2136  LIPASE 24   No results for input(s): AMMONIA in the last 168 hours. Coagulation Profile: No results for input(s): INR, PROTIME in the last 168  hours. Cardiac Enzymes: No results for input(s): CKTOTAL, CKMB, CKMBINDEX, TROPONINI in the last 168 hours. BNP (last 3 results) Recent Labs    10/31/24 1840  PROBNP 524.0*   HbA1C: No results for input(s): HGBA1C in the last 72 hours. CBG: No results for input(s): GLUCAP  in the last 168 hours. Lipid Profile: No results for input(s): CHOL, HDL, LDLCALC, TRIG, CHOLHDL, LDLDIRECT in the last 72 hours. Thyroid  Function Tests: No results for input(s): TSH, T4TOTAL, FREET4, T3FREE, THYROIDAB in the last 72 hours. Anemia Panel: No results for input(s): VITAMINB12, FOLATE, FERRITIN, TIBC, IRON , RETICCTPCT in the last 72 hours. Urine analysis:    Component Value Date/Time   COLORURINE YELLOW (A) 11/07/2024 0324   APPEARANCEUR CLEAR (A) 11/07/2024 0324   APPEARANCEUR Clear 05/25/2019 1554   LABSPEC 1.027 11/07/2024 0324   LABSPEC 1.012 03/07/2015 2143   PHURINE 5.0 11/07/2024 0324   GLUCOSEU >=500 (A) 11/07/2024 0324   GLUCOSEU Negative 03/07/2015 2143   HGBUR NEGATIVE 11/07/2024 0324   BILIRUBINUR NEGATIVE 11/07/2024 0324   BILIRUBINUR Negative 05/25/2019 1554   BILIRUBINUR Negative 03/07/2015 2143   KETONESUR NEGATIVE 11/07/2024 0324   PROTEINUR NEGATIVE 11/07/2024 0324   UROBILINOGEN 0.2 03/26/2017 0907   NITRITE NEGATIVE 11/07/2024 0324   LEUKOCYTESUR NEGATIVE 11/07/2024 0324   LEUKOCYTESUR Negative 03/07/2015 2143    Radiological Exams on Admission: DG Chest 2 View Result Date: 11/11/2024 CLINICAL DATA:  Shortness of breath.  Emphysema. EXAM: CHEST - 2 VIEW COMPARISON:  None Available. FINDINGS: The cardiopericardial silhouette is within normal limits for size. The lungs are clear without focal pneumonia, edema, pneumothorax or pleural effusion. No acute bony abnormality. Telemetry leads overlie the chest. IMPRESSION: No active cardiopulmonary disease. Electronically Signed   By: Camellia Candle M.D.   On: 11/11/2024 12:51   DG Knee Complete 4 Views Right Result Date: 11/11/2024 CLINICAL DATA:  Right knee pain EXAM: RIGHT KNEE - COMPLETE 4+ VIEW COMPARISON:  None Available. FINDINGS: No evidence of fracture, dislocation, or joint effusion. No evidence of arthropathy or other focal bone abnormality. Soft tissues are  unremarkable. IMPRESSION: Negative. Electronically Signed   By: Camellia Candle M.D.   On: 11/11/2024 12:50    EKG: Independently reviewed.  A-fib with RVR, no acute ST changes.  Assessment/Plan Principal Problem:   Afib (HCC) Active Problems:   Atrial fibrillation with RVR (HCC)   Influenza A  (please populate well all problems here in Problem List. (For example, if patient is on BP meds at home and you resume or decide to hold them, it is a problem that needs to be her. Same for CAD, COPD, HLD and so on)  A-fib with RVR - For rate control, plan to use 1 dose of IV digoxin , and as needed Lopressor .  Plus home dose of Lopressor  37.5 mg daily.  Avoid Cardizem  for history of reduced LVEF. - Continue Eliquis  - Change breathing treatment to Atrovent  plus Xopenex  to avoid beta-1 stimulation  Influenza A pneumonia Acute bronchitis -Start Tamiflu  given that his symptoms started within 48 hours. - Breathing treatment as above - P.o. prednisone   Chronic HFrEF - Euvolemic, - Resume Entresto  tomorrow  CAD, status post CABG and stenting - Stable angina, no chest pain at this time troponin trending at baseline - Continue ranolazine   IIDM - Continue Jardiance  and metformin  - SSI as patient is on steroid  DVT prophylaxis: Eliquis  Code Status: Full code Family Communication: None at bedside Disposition Plan: Expect less than 2 midnight hospital stay Consults called: None  Admission status: PCU observation   Cort ONEIDA Mana MD [facility] Pager [phone]  11/11/2024, 3:01 PM       [1]  Allergies Allergen Reactions   Demerol  [Meperidine Hcl]    Demerol [Meperidine] Hives   Morphine      Pill form not tolerated. Can have fluid form   Sulfa Antibiotics Hives   Albuterol  Other (See Comments) and Palpitations    Pt currently uses this medication.   Empagliflozin  Other (See Comments) and Hives    Perineal pain  Other Reaction(s): Other (See Comments)   Morphine  Sulfate Nausea  And Vomiting, Rash and Other (See Comments)    Pt states that he is only allergic to the tablet form of this medication.     "

## 2024-11-12 DIAGNOSIS — I4891 Unspecified atrial fibrillation: Secondary | ICD-10-CM | POA: Diagnosis present

## 2024-11-12 DIAGNOSIS — Z9049 Acquired absence of other specified parts of digestive tract: Secondary | ICD-10-CM | POA: Diagnosis not present

## 2024-11-12 DIAGNOSIS — Z9981 Dependence on supplemental oxygen: Secondary | ICD-10-CM | POA: Diagnosis not present

## 2024-11-12 DIAGNOSIS — Z7901 Long term (current) use of anticoagulants: Secondary | ICD-10-CM | POA: Diagnosis not present

## 2024-11-12 DIAGNOSIS — J1001 Influenza due to other identified influenza virus with the same other identified influenza virus pneumonia: Secondary | ICD-10-CM | POA: Diagnosis present

## 2024-11-12 DIAGNOSIS — R0602 Shortness of breath: Secondary | ICD-10-CM | POA: Diagnosis present

## 2024-11-12 DIAGNOSIS — Z87891 Personal history of nicotine dependence: Secondary | ICD-10-CM | POA: Diagnosis not present

## 2024-11-12 DIAGNOSIS — Z8249 Family history of ischemic heart disease and other diseases of the circulatory system: Secondary | ICD-10-CM | POA: Diagnosis not present

## 2024-11-12 DIAGNOSIS — I110 Hypertensive heart disease with heart failure: Secondary | ICD-10-CM | POA: Diagnosis present

## 2024-11-12 DIAGNOSIS — M25561 Pain in right knee: Secondary | ICD-10-CM | POA: Diagnosis present

## 2024-11-12 DIAGNOSIS — Z833 Family history of diabetes mellitus: Secondary | ICD-10-CM | POA: Diagnosis not present

## 2024-11-12 DIAGNOSIS — Z66 Do not resuscitate: Secondary | ICD-10-CM | POA: Diagnosis present

## 2024-11-12 DIAGNOSIS — Z951 Presence of aortocoronary bypass graft: Secondary | ICD-10-CM | POA: Diagnosis not present

## 2024-11-12 DIAGNOSIS — E119 Type 2 diabetes mellitus without complications: Secondary | ICD-10-CM | POA: Diagnosis present

## 2024-11-12 DIAGNOSIS — I5042 Chronic combined systolic (congestive) and diastolic (congestive) heart failure: Secondary | ICD-10-CM | POA: Diagnosis present

## 2024-11-12 DIAGNOSIS — J440 Chronic obstructive pulmonary disease with acute lower respiratory infection: Secondary | ICD-10-CM | POA: Diagnosis present

## 2024-11-12 DIAGNOSIS — M1990 Unspecified osteoarthritis, unspecified site: Secondary | ICD-10-CM | POA: Diagnosis present

## 2024-11-12 DIAGNOSIS — I480 Paroxysmal atrial fibrillation: Secondary | ICD-10-CM | POA: Diagnosis present

## 2024-11-12 DIAGNOSIS — J209 Acute bronchitis, unspecified: Secondary | ICD-10-CM | POA: Diagnosis present

## 2024-11-12 DIAGNOSIS — M51369 Other intervertebral disc degeneration, lumbar region without mention of lumbar back pain or lower extremity pain: Secondary | ICD-10-CM | POA: Diagnosis present

## 2024-11-12 DIAGNOSIS — E785 Hyperlipidemia, unspecified: Secondary | ICD-10-CM | POA: Diagnosis present

## 2024-11-12 DIAGNOSIS — Z7984 Long term (current) use of oral hypoglycemic drugs: Secondary | ICD-10-CM | POA: Diagnosis not present

## 2024-11-12 DIAGNOSIS — G4730 Sleep apnea, unspecified: Secondary | ICD-10-CM | POA: Diagnosis present

## 2024-11-12 DIAGNOSIS — Z9861 Coronary angioplasty status: Secondary | ICD-10-CM | POA: Diagnosis not present

## 2024-11-12 DIAGNOSIS — I25118 Atherosclerotic heart disease of native coronary artery with other forms of angina pectoris: Secondary | ICD-10-CM | POA: Diagnosis present

## 2024-11-12 DIAGNOSIS — Z85828 Personal history of other malignant neoplasm of skin: Secondary | ICD-10-CM | POA: Diagnosis not present

## 2024-11-12 LAB — CBG MONITORING, ED
Glucose-Capillary: 177 mg/dL — ABNORMAL HIGH (ref 70–99)
Glucose-Capillary: 117 mg/dL — ABNORMAL HIGH (ref 70–99)
Glucose-Capillary: 205 mg/dL — ABNORMAL HIGH (ref 70–99)

## 2024-11-12 LAB — GLUCOSE, CAPILLARY: Glucose-Capillary: 117 mg/dL — ABNORMAL HIGH (ref 70–99)

## 2024-11-12 MED ORDER — OXYCODONE HCL 5 MG PO TABS
5.0000 mg | ORAL_TABLET | ORAL | Status: DC | PRN
  Administered 2024-11-12 – 2024-11-13 (×3): 5 mg via ORAL
  Filled 2024-11-12 (×2): qty 1

## 2024-11-12 MED ORDER — OXYCODONE-ACETAMINOPHEN 5-325 MG PO TABS
2.0000 | ORAL_TABLET | ORAL | Status: DC | PRN
  Administered 2024-11-12 – 2024-11-13 (×4): 2 via ORAL
  Filled 2024-11-12 (×2): qty 2

## 2024-11-12 NOTE — ED Notes (Signed)
 RN answered pt call light. Pt sts  I need two italian ice push pops. RN explained to pt that he is a diabetic and that is going to raise his sugar levels and is not apart of his diet order.  Pt sts  Then take my lunch tray away. I dont want it.

## 2024-11-12 NOTE — Progress Notes (Signed)
 " PROGRESS NOTE    Lawrence [name]  FMW:[id number] DOB: 1956/10/23 DOA: 11/11/2024 PCP: [name], MD    Brief Narrative:   68 y.o. male with medical history significant of CAD stable angina, status post stenting, CABG in 2016, PAF on Eliquis , PSVT/frequent PVCs, HTN, IIDM, chronic HFrEF with LVEF 35-40%, presented with multiple complaints including new onset of cycles of fever chills, dry cough wheezing muscle aching and abdominal pain.   Symptoms started 2 days ago when patient started to have sore throat dry cough and wheezing increasing shortness of breath.  Yesterday he also started to feel palpitations and whole body aching and cramping like abdominal pain but denied any nauseous vomiting or diarrhea.   Assessment & Plan:   Principal Problem:   Afib (HCC) Active Problems:   Atrial fibrillation with RVR (HCC)   Influenza A   Atrial fibrillation with rapid ventricular response (HCC)  A-fib with RVR Rate remains uncontrolled Is received 1 dose of IV digoxin  Plan: Avoid CCB given history of reduced EF Continue Lopressor  37.5 mg daily Telemetry monitoring Eliquis   Influenza A pneumonia Acute bronchitis Continue course of Tamiflu  Bronchodilators P.o. prednisone    Chronic HFrEF - Euvolemic, - Resume Entresto    CAD, status post CABG and stenting - Stable angina, no chest pain at this time troponin trending at baseline - Continue ranolazine    IIDM - Continue Jardiance  and metformin  - SSI as patient is on steroid    DVT prophylaxis: Eliquis  Code Status: Full Family Communication: None Disposition Plan: Status is: Observation The patient will require care spanning > 2 midnights and should be moved to inpatient because: Rapid atrial fibrillation requiring rate control   Level of care: Telemetry  Consultants:  None  Procedures:  None  Antimicrobials: Tamiflu    Subjective: Seen and examined.  Reports weakness, shortness of breath, cough, chest  pain  Objective: Vitals:   11/12/24 1444 11/12/24 1456 11/12/24 1600 11/12/24 1623  BP: 120/63  94/77   Pulse: (!) 112 88  94  Resp: (!) 22     Temp: 98 F (36.7 C)   98.1 F (36.7 C)  TempSrc: Oral   Oral  SpO2: 95% 98%  92%    Intake/Output Summary (Last 24 hours) at 11/12/2024 1644 Last data filed at 11/12/2024 1100 Gross per 24 hour  Intake 240 ml  Output 1400 ml  Net -1160 ml   There were no vitals filed for this visit.  Examination:  General exam: Appears fatigued Respiratory system: Clear to auscultation. Respiratory effort normal. Cardiovascular system: S1-S2, RRR, no murmurs, no pedal edema Gastrointestinal system: soft, NT/ND, normal bowel sounds Central nervous system: Alert and oriented. No focal neurological deficits. Extremities: Symmetric 5 x 5 power. Skin: No rashes, lesions or ulcers Psychiatry: Judgement and insight appear normal. Mood & affect appropriate.     Data Reviewed: I have personally reviewed following labs and imaging studies  CBC: Recent Labs  Lab 11/06/24 2136 11/11/24 1054  WBC 6.0 8.5  HGB 17.6* 17.0  HCT 53.7* 51.9  MCV 92.3 92.8  PLT 166 154   Basic Metabolic Panel: Recent Labs  Lab 11/06/24 2136 11/11/24 1054  NA 141 136  K 3.6 3.8  CL 101 102  CO2 30 20*  GLUCOSE 253* 166*  BUN 18 21  CREATININE 0.91 0.84  CALCIUM  9.5 9.1   GFR: Estimated Creatinine Clearance: 76.5 mL/min (by C-G formula based on SCr of 0.84 mg/dL). Liver Function Tests: Recent Labs  Lab 11/06/24 2136  AST 15  ALT 21  ALKPHOS 66  BILITOT 0.5  PROT 6.0*  ALBUMIN 3.9   Recent Labs  Lab 11/06/24 2136  LIPASE 24   No results for input(s): AMMONIA in the last 168 hours. Coagulation Profile: No results for input(s): INR, PROTIME in the last 168 hours. Cardiac Enzymes: No results for input(s): CKTOTAL, CKMB, CKMBINDEX, TROPONINI in the last 168 hours. BNP (last 3 results) Recent Labs    10/31/24 1840  PROBNP 524.0*    HbA1C: No results for input(s): HGBA1C in the last 72 hours. CBG: Recent Labs  Lab 11/11/24 1759 11/11/24 2113 11/12/24 0754 11/12/24 1224 11/12/24 1609  GLUCAP 331* 272* 177* 117* 205*   Lipid Profile: No results for input(s): CHOL, HDL, LDLCALC, TRIG, CHOLHDL, LDLDIRECT in the last 72 hours. Thyroid  Function Tests: No results for input(s): TSH, T4TOTAL, FREET4, T3FREE, THYROIDAB in the last 72 hours. Anemia Panel: No results for input(s): VITAMINB12, FOLATE, FERRITIN, TIBC, IRON , RETICCTPCT in the last 72 hours. Sepsis Labs: No results for input(s): PROCALCITON, LATICACIDVEN in the last 168 hours.  Recent Results (from the past 240 hours)  Resp panel by RT-PCR (RSV, Flu A&B, Covid) Anterior Nasal Swab     Status: Abnormal   Collection Time: 11/11/24 11:02 AM   Specimen: Anterior Nasal Swab  Result Value Ref Range Status   SARS Coronavirus 2 by RT PCR NEGATIVE NEGATIVE Final    Comment: (NOTE) SARS-CoV-2 target nucleic acids are NOT DETECTED.  The SARS-CoV-2 RNA is generally detectable in upper respiratory specimens during the acute phase of infection. The lowest concentration of SARS-CoV-2 viral copies this assay can detect is 138 copies/mL. A negative result does not preclude SARS-Cov-2 infection and should not be used as the sole basis for treatment or other patient management decisions. A negative result may occur with  improper specimen collection/handling, submission of specimen other than nasopharyngeal swab, presence of viral mutation(s) within the areas targeted by this assay, and inadequate number of viral copies(<138 copies/mL). A negative result must be combined with clinical observations, patient history, and epidemiological information. The expected result is Negative.  Fact Sheet for Patients:  bloggercourse.com  Fact Sheet for Healthcare Providers:   seriousbroker.it  This test is no t yet approved or cleared by the [place]  FDA and  has been authorized for detection and/or diagnosis of SARS-CoV-2 by FDA under an Emergency Use Authorization (EUA). This EUA will remain  in effect (meaning this test can be used) for the duration of the COVID-19 declaration under Section 564(b)(1) of the Act, 21 U.S.C.section 360bbb-3(b)(1), unless the authorization is terminated  or revoked sooner.       Influenza A by PCR POSITIVE (A) NEGATIVE Final   Influenza B by PCR NEGATIVE NEGATIVE Final    Comment: (NOTE) The Xpert Xpress SARS-CoV-2/FLU/RSV plus assay is intended as an aid in the diagnosis of influenza from Nasopharyngeal swab specimens and should not be used as a sole basis for treatment. Nasal washings and aspirates are unacceptable for Xpert Xpress SARS-CoV-2/FLU/RSV testing.  Fact Sheet for Patients: bloggercourse.com  Fact Sheet for Healthcare Providers: seriousbroker.it  This test is not yet approved or cleared by the [place]  FDA and has been authorized for detection and/or diagnosis of SARS-CoV-2 by FDA under an Emergency Use Authorization (EUA). This EUA will remain in effect (meaning this test can be used) for the duration of the COVID-19 declaration under Section 564(b)(1) of the Act, 21 U.S.C. section 360bbb-3(b)(1), unless the authorization is terminated or revoked.  Resp Syncytial Virus by PCR NEGATIVE NEGATIVE Final    Comment: (NOTE) Fact Sheet for Patients: bloggercourse.com  Fact Sheet for Healthcare Providers: seriousbroker.it  This test is not yet approved or cleared by the [place]  FDA and has been authorized for detection and/or diagnosis of SARS-CoV-2 by FDA under an Emergency Use Authorization (EUA). This EUA will remain in effect (meaning this test can be used)  for the duration of the COVID-19 declaration under Section 564(b)(1) of the Act, 21 U.S.C. section 360bbb-3(b)(1), unless the authorization is terminated or revoked.  Performed at [facility], [address] [place], [place] [address]          Radiology Studies: DG Chest 2 View Result Date: 11/11/2024 CLINICAL DATA:  Shortness of breath.  Emphysema. EXAM: CHEST - 2 VIEW COMPARISON:  None Available. FINDINGS: The cardiopericardial silhouette is within normal limits for size. The lungs are clear without focal pneumonia, edema, pneumothorax or pleural effusion. No acute bony abnormality. Telemetry leads overlie the chest. IMPRESSION: No active cardiopulmonary disease. Electronically Signed   By: Camellia Candle M.D.   On: 11/11/2024 12:51   DG Knee Complete 4 Views Right Result Date: 11/11/2024 CLINICAL DATA:  Right knee pain EXAM: RIGHT KNEE - COMPLETE 4+ VIEW COMPARISON:  None Available. FINDINGS: No evidence of fracture, dislocation, or joint effusion. No evidence of arthropathy or other focal bone abnormality. Soft tissues are unremarkable. IMPRESSION: Negative. Electronically Signed   By: Camellia Candle M.D.   On: 11/11/2024 12:50        Scheduled Meds:  allopurinol   300 mg Oral BID   apixaban   5 mg Oral BID   atorvastatin   80 mg Oral QHS   budesonide -glycopyrrolate -formoterol   2 puff Inhalation BID   dapagliflozin  propanediol  10 mg Oral Daily   famotidine   20 mg Oral BID   insulin  aspart  0-15 Units Subcutaneous TID WC   insulin  aspart  0-5 Units Subcutaneous QHS   ipratropium  0.5 mg Nebulization Q6H   loratadine   10 mg Oral Daily   metFORMIN   500 mg Oral BID WC   metoprolol  succinate  37.5 mg Oral Daily   oseltamivir   75 mg Oral BID   pantoprazole   40 mg Oral Daily   predniSONE   40 mg Oral Q breakfast   sacubitril -valsartan   1 tablet Oral BID   Continuous Infusions:   LOS: 0 days      Lawrence KATHEE Robson, MD [facility]   If 7PM-7AM,  please contact night-coverage  11/12/2024, 4:44 PM   "

## 2024-11-12 NOTE — Progress Notes (Signed)
 Pt states that he can't sleep but is in fact asleep every time this nurse enters the room to complete a task. At times it takes calling his name 2 times to wake him. Despite this, he was offered his prn trazodone  but declined it.

## 2024-11-12 NOTE — ED Notes (Signed)
 Pt given two packs of graham crackers per pt request.

## 2024-11-12 NOTE — ED Notes (Signed)
 RN answered pt call light. Pt sts that he wants two diet ginger ales and more pain medication. RN advised pt that this RN would let his primary RN know and that he would get his diet ginger ales as requested.

## 2024-11-13 ENCOUNTER — Other Ambulatory Visit: Payer: Self-pay

## 2024-11-13 DIAGNOSIS — I480 Paroxysmal atrial fibrillation: Secondary | ICD-10-CM | POA: Diagnosis not present

## 2024-11-13 LAB — GLUCOSE, CAPILLARY
Glucose-Capillary: 152 mg/dL — ABNORMAL HIGH (ref 70–99)
Glucose-Capillary: 150 mg/dL — ABNORMAL HIGH (ref 70–99)

## 2024-11-13 MED ORDER — HYDROCOD POLI-CHLORPHE POLI ER 10-8 MG/5ML PO SUER
5.0000 mL | Freq: Two times a day (BID) | ORAL | Status: DC
  Administered 2024-11-13: 5 mL via ORAL
  Filled 2024-11-13: qty 5

## 2024-11-13 MED ORDER — PREDNISONE 20 MG PO TABS
40.0000 mg | ORAL_TABLET | Freq: Every day | ORAL | 0 refills | Status: AC
  Filled 2024-11-13: qty 6, 3d supply, fill #0

## 2024-11-13 MED ORDER — BENZONATATE 100 MG PO CAPS
200.0000 mg | ORAL_CAPSULE | Freq: Three times a day (TID) | ORAL | Status: DC
  Administered 2024-11-13: 200 mg via ORAL
  Filled 2024-11-13: qty 2

## 2024-11-13 MED ORDER — OXYCODONE HCL 5 MG PO TABS
5.0000 mg | ORAL_TABLET | ORAL | Status: DC | PRN
  Administered 2024-11-13: 5 mg via ORAL
  Filled 2024-11-13: qty 1

## 2024-11-13 MED ORDER — OSELTAMIVIR PHOSPHATE 75 MG PO CAPS
75.0000 mg | ORAL_CAPSULE | Freq: Two times a day (BID) | ORAL | 0 refills | Status: AC
  Filled 2024-11-13: qty 6, 3d supply, fill #0

## 2024-11-13 NOTE — Progress Notes (Signed)
 Went in to pt room to inform him that the MD was notified of his continued requests for pain medication and complaints of inability to sleep, however, the pt was asleep and snoring. Will not disturb this sleep at this time.

## 2024-11-13 NOTE — TOC Initial Note (Signed)
 Transition of Care [facility]) - Initial/Assessment Note    Patient Details  Name: Lawrence [name] MRN: [id number] Date of Birth: [birth year]  Transition of Care [facility]) CM/SW Contact:    Shasta DELENA Daring, RN Phone Number: 11/13/2024, 4:00 PM  Clinical Narrative:                 RNCM assessed patient. He was alone in the room, sitting up in bed. Appeared alert and oriented. Able to participate in conversation. Patient says he lives in [place] [facility] [place] with his grandson, who is also his caregiver. Patient said they are looking for another place to live.  Patient said his medicaid has expired, but he has done what was required to have it reinstated. Confirmed his PCP. Patient stated he has been getting his grandson's friend to take him to appointments but can also call his son. He plans to ask his grandson to arrange a ride home or call his son for a ride home at discharge.  Called patient to advise him to have his grandson bring his O2 tank for the ride home. Patient says he only uses O2 as needed and that he will be fine for the ride home. Also, grandson is 10 minutes away.  No additional TOC needs.  RNCM signing off.  Expected Discharge Plan: Home/Self Care Barriers to Discharge: Continued Medical Work up   Patient Goals and CMS Choice            Expected Discharge Plan and Services In-house Referral: Clinical Social Work Discharge Planning Services: CM Consult     Expected Discharge Date: 11/13/24                                    Prior Living Arrangements/Services   Lives with:: Other (Comment) (grandson) Patient language and need for interpreter reviewed:: Yes Do you feel safe going back to the place where you live?: Yes      Need for Family Participation in Patient Care: Yes (Comment) Care giver support system in place?: Yes (comment) Current home services: DME (has hospital bed, O2, rollator, wheelchair) Criminal Activity/Legal Involvement Pertinent to Current  Situation/Hospitalization: No - Comment as needed  Activities of Daily Living      Permission Sought/Granted   Permission granted to share information with : Yes, Verbal Permission Granted              Emotional Assessment Appearance:: Appears stated age Attitude/Demeanor/Rapport: Engaged, Gracious Affect (typically observed): Appropriate Orientation: : Oriented to Self, Oriented to Place, Oriented to  Time, Oriented to Situation Alcohol / Substance Use: Not Applicable Psych Involvement: No (comment)  Admission diagnosis:  Paroxysmal atrial fibrillation (HCC) [I48.0] Influenza A [J10.1] Helicobacter pylori (H. pylori) infection [A04.8] Afib (HCC) [I48.91] Atrial fibrillation with rapid ventricular response (HCC) [I48.91] Atrial fibrillation with RVR (HCC) [I48.91] Essential hypertension [I10] Acute pain of right knee [M25.561] Chronic combined systolic (congestive) and diastolic (congestive) heart failure (HCC) [I50.42] Insomnia, unspecified type [G47.00] Patient Active Problem List   Diagnosis Date Noted   Atrial fibrillation with rapid ventricular response (HCC) 11/12/2024   Influenza A 11/11/2024   Afib (HCC) 11/11/2024   COPD exacerbation (HCC) 10/17/2024   Anxiety and depression 10/17/2024   Chronic atrial fibrillation with RVR (HCC) 10/17/2024   Coronary artery disease 10/17/2024   Hypocalcemia 10/06/2024   Protein-calorie malnutrition, severe 10/06/2024   Substernal precordial chest pain 10/05/2024   Right upper quadrant abdominal  pain 08/14/2024   Elevated hemoglobin 08/14/2024   COPD (chronic obstructive pulmonary disease) (HCC) 06/10/2024   Bipolar disorder (HCC) 06/10/2024   Mild cognitive impairment 06/09/2024   Atrial fibrillation with RVR (HCC) 05/30/2024   Hypotension 10/18/2023   COPD with acute exacerbation (HCC) 10/18/2023   Generalized weakness 10/18/2023   Left hip pain 10/13/2023   Malfunction of continuous or biphasic positive airway  pressure machine 10/13/2023   Overweight (BMI 25.0-29.9) 10/02/2023   Chest pain 10/01/2023   Near syncope 09/29/2023   Dyslipidemia 09/29/2023   Anxiety 09/29/2023   Atrial fibrillation, chronic (HCC) 09/29/2023   NSTEMI (non-ST elevated myocardial infarction) (HCC) 08/04/2023   CAD S/P percutaneous coronary angioplasty 08/04/2023   PAD (peripheral artery disease) 07/11/2023   Controlled type 2 diabetes mellitus without complication, without long-term current use of insulin  (HCC) 05/17/2023   Gout 02/22/2023   Hx of CABG    Chronic HFrEF (heart failure with reduced ejection fraction) (HCC)    Atherosclerosis of aorta 12/16/2020   Syncope 12/11/2020   Polycythemia 10/05/2020   Elevated troponin 03/07/2020   Chronic respiratory failure with hypoxia (HCC) 05/29/2019   Acute on chronic congestive heart failure (HCC) 04/18/2018   OSA (obstructive sleep apnea) 12/10/2015   Depression with anxiety 11/07/2015   BPH (benign prostatic hyperplasia) 08/01/2015   Achalasia 07/24/2014   GERD without esophagitis 06/07/2014   Mixed hyperlipidemia 04/09/2013   PCP:  [name], MD Pharmacy:   [facility] - [place], [place] - [address] [place] [address] Phone: [phone] Fax: [phone]  [facility] - [place], [place] - [address] [place] [address] Phone: [phone] Fax: [phone]  [id number] - [place], [place] - [address] AT [facility] OF [address] [place] [address] [place] [address] Phone: [phone] Fax: [phone]  [facility] [address] [place] [address] Phone: [phone] Fax: [phone]     Social Drivers of Health (SDOH) Social History: SDOH Screenings   Food Insecurity: No Food Insecurity (11/13/2024)  Recent Concern: Food Insecurity - Food Insecurity Present (10/17/2024)   Housing: Low Risk (11/13/2024)  Transportation Needs: Unmet Transportation Needs (11/13/2024)  Utilities: Not At Risk (11/13/2024)  Alcohol Screen: Low Risk (03/14/2024)  Depression (PHQ2-9): Medium Risk (10/31/2024)  Financial Resource Strain: Low Risk (06/19/2024)   Received from [facility]  Physical Activity: Insufficiently Active (03/14/2024)  Social Connections: Socially Isolated (11/13/2024)  Stress: No Stress Concern Present (03/14/2024)  Recent Concern: Stress - Stress Concern Present (01/13/2024)  Tobacco Use: Medium Risk (11/11/2024)  Health Literacy: Adequate Health Literacy (03/14/2024)   SDOH Interventions:     Readmission Risk Interventions    11/13/2024    3:56 PM 09/30/2023   10:28 AM 08/05/2023   10:11 AM  Readmission Risk Prevention Plan  Transportation Screening Complete Complete Complete  Medication Review (RN Care Manager) Complete Complete Complete  PCP or Specialist appointment within 3-5 days of discharge Complete Complete Complete  HRI or Home Care Consult Complete    SW Recovery Care/Counseling Consult Complete Complete Complete  Palliative Care Screening Not Applicable Not Applicable Not Applicable  Skilled Nursing Facility Not Applicable Not Applicable Not Applicable

## 2024-11-13 NOTE — TOC Transition Note (Signed)
 Transition of Care [facility]) - Discharge Note   Patient Details  Name: Lawrence [name] MRN: [id number] Date of Birth: 28-Jun-1956  Transition of Care [facility]) CM/SW Contact:  Shasta DELENA Daring, RN Phone Number: 11/13/2024, 4:19 PM   Clinical Narrative:    See initial note.  Patient says he uses O2 only as needed and will not need it for the ride home.   No additional TOC needs  RNCM signing off.   Final next level of care: Home/Self Care Barriers to Discharge: Barriers Resolved   Patient Goals and CMS Choice            Discharge Placement                  Name of family member notified: patient Patient and family notified of of transfer: 11/13/24  Discharge Plan and Services Additional resources added to the After Visit Summary for   In-house Referral: Clinical Social Work Discharge Planning Services: CM Consult                                 Social Drivers of Health (SDOH) Interventions SDOH Screenings   Food Insecurity: No Food Insecurity (11/13/2024)  Recent Concern: Food Insecurity - Food Insecurity Present (10/17/2024)  Housing: Low Risk (11/13/2024)  Transportation Needs: Unmet Transportation Needs (11/13/2024)  Utilities: Not At Risk (11/13/2024)  Alcohol Screen: Low Risk (03/14/2024)  Depression (PHQ2-9): Medium Risk (10/31/2024)  Financial Resource Strain: Low Risk (06/19/2024)   Received from [facility]  Physical Activity: Insufficiently Active (03/14/2024)  Social Connections: Socially Isolated (11/13/2024)  Stress: No Stress Concern Present (03/14/2024)  Recent Concern: Stress - Stress Concern Present (01/13/2024)  Tobacco Use: Medium Risk (11/11/2024)  Health Literacy: Adequate Health Literacy (03/14/2024)     Readmission Risk Interventions    11/13/2024    3:56 PM 09/30/2023   10:28 AM 08/05/2023   10:11 AM  Readmission Risk Prevention Plan  Transportation Screening Complete Complete Complete  Medication Review Oceanographer)  Complete Complete Complete  PCP or Specialist appointment within 3-5 days of discharge Complete Complete Complete  HRI or Home Care Consult Complete    SW Recovery Care/Counseling Consult Complete Complete Complete  Palliative Care Screening Not Applicable Not Applicable Not Applicable  Skilled Nursing Facility Not Applicable Not Applicable Not Applicable

## 2024-11-13 NOTE — Discharge Summary (Signed)
 Physician Discharge Summary  Lawrence [name] FMW:[id number] DOB: Jul 08, 1956 DOA: 11/11/2024  PCP: Lawrence Nancyann BRAVO, MD  Admit date: 11/11/2024 Discharge date: 11/13/2024  Admitted From: Home Disposition:  Home  Recommendations for Outpatient Follow-up:  Follow up with PCP in 1-2 weeks   Home Health:No  Equipment/Devices:None   Discharge Condition:Stable  CODE STATUS:DNR-limited  Diet recommendation: Reg  Brief/Interim Summary:   68 y.o. male with medical history significant of CAD stable angina, status post stenting, CABG in 2016, PAF on Eliquis , PSVT/frequent PVCs, HTN, IIDM, chronic HFrEF with LVEF 35-40%, presented with multiple complaints including new onset of cycles of fever chills, dry cough wheezing muscle aching and abdominal pain.   Symptoms started 2 days ago when patient started to have sore throat dry cough and wheezing increasing shortness of breath.  Yesterday he also started to feel palpitations and whole body aching and cramping like abdominal pain but denied any nauseous vomiting or diarrhea.      Discharge Diagnoses:  Principal Problem:   Afib (HCC) Active Problems:   Atrial fibrillation with RVR (HCC)   Influenza A   Atrial fibrillation with rapid ventricular response (HCC)  A-fib with RVR Rate improved Plan: Resume Lopressor  37.5 mg daily.  Resume Eliquis    Influenza A pneumonia Acute bronchitis Continue course of Tamiflu , remainder prescribed on discharge Short course p.o. prednisone  prescribed   Discharge Instructions  Discharge Instructions     Increase activity slowly   Complete by: As directed       Allergies as of 11/13/2024       Reactions   Demerol  [meperidine Hcl]    Demerol [meperidine] Hives   Morphine     Pill form not tolerated. Can have fluid form   Sulfa Antibiotics Hives   Albuterol  Other (See Comments), Palpitations   Pt currently uses this medication.   Empagliflozin  Other (See Comments), Hives   Perineal  pain Other Reaction(s): Other (See Comments)   Morphine  Sulfate Nausea And Vomiting, Rash, Other (See Comments)   Pt states that he is only allergic to the tablet form of this medication.          Medication List     STOP taking these medications    ranolazine  1000 MG SR tablet Commonly known as: RANEXA        TAKE these medications    Accu-Chek Guide test strip Generic drug: glucose blood Use as instructed to check sugar daily for type 2 diabetes.   Accu-Chek Guide w/Device Kit Use to check blood sugars as directed   Accu-Chek Softclix Lancets lancets Use as instructed to check sugar daily for type 2 diabetes.   acetaminophen  325 MG tablet Commonly known as: TYLENOL  Take 2 tablets (650 mg total) by mouth every 6 (six) hours as needed for mild pain (pain score 1-3) or fever (or Fever >/= 101).   albuterol  (2.5 MG/3ML) 0.083% nebulizer solution Commonly known as: PROVENTIL  Take 3 mLs (2.5 mg total) by nebulization every 6 (six) hours as needed for wheezing or shortness of breath.   allopurinol  300 MG tablet Commonly known as: ZYLOPRIM  TAKE 1 TABLET BY MOUTH TWICE A DAY   ALPRAZolam  1 MG tablet Commonly known as: XANAX  TAKE 1 TABLET BY MOUTH 3 TIMES A DAY AS NEEDED   apixaban  5 MG Tabs tablet Commonly known as: Eliquis  Take 1 tablet (5 mg total) by mouth 2 (two) times daily.   atorvastatin  80 MG tablet Commonly known as: LIPITOR  TAKE 1 TABLET BY MOUTH AT BEDTIME  Blood Pressure Monitor Devi Use to check blood pressure daily   Breztri  Aerosphere 160-9-4.8 MCG/ACT Aero inhaler Generic drug: budesonide -glycopyrrolate -formoterol  INHALE 2 PUFFS BY MOUTH INTO THE LUNGS 2TIMES DAILY   cetirizine  10 MG tablet Commonly known as: ZYRTEC  Take 1 tablet (10 mg total) by mouth at bedtime.   cholecalciferol  25 MCG (1000 UNIT) tablet Commonly known as: VITAMIN D3 Take 1,000 Units by mouth daily.   famotidine  20 MG tablet Commonly known as: PEPCID  TAKE 1 TABLET BY  MOUTH TWICE A DAY   Farxiga  10 MG Tabs tablet Generic drug: dapagliflozin  propanediol TAKE 1 TABLET BY MOUTH DAILY   iron  polysaccharides 150 MG capsule Commonly known as: Ferrex 150 Take one capsul every Monday, Wednesday, and Friday   metFORMIN  500 MG 24 hr tablet Commonly known as: GLUCOPHAGE -XR TAKE 1 TABLET BY MOUTH DAILY WITH EVENING MEAL   methocarbamol  500 MG tablet Commonly known as: ROBAXIN  TAKE 1 TABLET BY MOUTH EVERY 8 HOURS AS NEEDED FOR MUSCLE SPASMS   metoprolol  succinate 25 MG 24 hr tablet Commonly known as: TOPROL -XL Take 1.5 tablets (37.5 mg total) by mouth daily. What changed: how much to take   nitroGLYCERIN  0.4 MG SL tablet Commonly known as: NITROSTAT  Place 1 tablet (0.4 mg total) under the tongue every 5 (five) minutes as needed for chest pain (Do not take if blood pressure is low, systolic BP<110).   omega-3 acid ethyl esters 1 g capsule Commonly known as: LOVAZA  TAKE 4 CAPSULES (4 GRAMS TOTAL) BY MOUTHDAILY   oseltamivir  75 MG capsule Commonly known as: TAMIFLU  Take 1 capsule (75 mg total) by mouth 2 (two) times daily for 6 doses.   oxyCODONE  5 MG immediate release tablet Commonly known as: Roxicodone  Take 1 tablet (5 mg total) by mouth every 8 (eight) hours as needed. What changed: when to take this   oxyCODONE -acetaminophen  10-325 MG tablet Commonly known as: PERCOCET TAKE 1 TABLET BY MOUTH EVERY 4 HOURS AS NEEDED FOR PAIN   OXYGEN  Inhale 2 L into the lungs at bedtime as needed (for shortness of breath).   pantoprazole  40 MG tablet Commonly known as: PROTONIX  TAKE 1 TABLET BY MOUTH 2 TIMES DAILY (30MINUTES BEFORE A MEAL) What changed: See the new instructions.   predniSONE  20 MG tablet Commonly known as: DELTASONE  Take 2 tablets (40 mg total) by mouth daily with breakfast for 3 days. Start taking on: November 14, 2024   sacubitril -valsartan  49-51 MG Commonly known as: Entresto  Take 1 tablet by mouth 2 (two) times daily.    traZODone  150 MG tablet Commonly known as: DESYREL  TAKE 1 TABLET BY MOUTH AT BEDTIME AS NEEDED FOR SLEEP. What changed: when to take this   Vitamin B-12 1000 MCG Subl Place 1 tablet under the tongue daily.        Allergies[1]  Consultations: None   Procedures/Studies: DG Chest 2 View Result Date: 11/11/2024 CLINICAL DATA:  Shortness of breath.  Emphysema. EXAM: CHEST - 2 VIEW COMPARISON:  None Available. FINDINGS: The cardiopericardial silhouette is within normal limits for size. The lungs are clear without focal pneumonia, edema, pneumothorax or pleural effusion. No acute bony abnormality. Telemetry leads overlie the chest. IMPRESSION: No active cardiopulmonary disease. Electronically Signed   By: Camellia Candle M.D.   On: 11/11/2024 12:51   DG Knee Complete 4 Views Right Result Date: 11/11/2024 CLINICAL DATA:  Right knee pain EXAM: RIGHT KNEE - COMPLETE 4+ VIEW COMPARISON:  None Available. FINDINGS: No evidence of fracture, dislocation, or joint effusion. No evidence of  arthropathy or other focal bone abnormality. Soft tissues are unremarkable. IMPRESSION: Negative. Electronically Signed   By: Camellia Candle M.D.   On: 11/11/2024 12:50   CT ABDOMEN PELVIS W CONTRAST Result Date: 11/07/2024 EXAM: CT ABDOMEN AND PELVIS WITH CONTRAST 11/07/2024 04:09:42 AM TECHNIQUE: CT of the abdomen and pelvis was performed with the administration of 100 mL of iohexol  (OMNIPAQUE ) 300 MG/ML solution. Multiplanar reformatted images are provided for review. Automated exposure control, iterative reconstruction, and/or weight-based adjustment of the mA/kV was utilized to reduce the radiation dose to as low as reasonably achievable. COMPARISON: CT with IV contrast 09/09/2024 and 08/13/2024. CLINICAL HISTORY: Epigastric pain, history of pancreatitis. FINDINGS: LOWER CHEST: Lung bases are clear. There is a small hiatal hernia. The cardiac size is normal. There are coronary artery calcifications. LIVER: The  liver is unremarkable. GALLBLADDER AND BILE DUCTS: The gallbladder is surgically absent. There is no biliary dilatation. SPLEEN: No acute abnormality. PANCREAS: There is no pancreatic mass enhancement, ductal dilatation, or adjacent edema. ADRENAL GLANDS: There is bilateral chronic nodular thickening in the adrenal glands consistent with adenomas. KIDNEYS, URETERS AND BLADDER: No stones in the kidneys or ureters. No hydronephrosis. No perinephric or periureteral stranding. There is no renal mass. There is a Bosniak 1 cyst in the anterior hilar cortex of the right kidney, Hounsfield density is 19. No follow-up imaging is recommended. Urinary bladder is unremarkable. GI AND BOWEL: Stomach demonstrates no acute abnormality. The unopacified small bowel is unremarkable. The appendix is normal. There is moderate retained stool increasing in the ascending and transverse colon. No thickening of the large bowel wall. There is sigmoid diverticulosis without evidence of diverticulitis. There is no bowel obstruction. PERITONEUM AND RETROPERITONEUM: No ascites. No free air. There are small umbilical and left inguinal fat hernias. No incarcerated hernia. VASCULATURE: Aorta is normal in caliber. There is moderate to heavy aortic and branch vessel atherosclerosis without aneurysm or dissection. LYMPH NODES: No lymphadenopathy. REPRODUCTIVE ORGANS: No acute abnormality. BONES AND SOFT TISSUES: There is degenerative change of the lumbar spine. There is acquired spinal stenosis at L4-L5. No acute osseous abnormality. No focal soft tissue abnormality. IMPRESSION: 1. No acute CT findings in the abdomen or pelvis. No CT evidence of acute pancreatitis. 2. Acquired spinal stenosis at L4-L5. 3. Increased constipation. Diverticulosis. Electronically signed by: Francis Quam MD 11/07/2024 04:49 AM EST RP Workstation: [id number]   DG Chest Portable 1 View Result Date: 10/31/2024 EXAM: 1 VIEW(S) XRAY OF THE CHEST 10/31/2024 06:30:00 PM  COMPARISON: 10/16/2024 CLINICAL HISTORY: chest pain FINDINGS: LUNGS AND PLEURA: No focal pulmonary opacity. No pleural effusion. No pneumothorax. HEART AND MEDIASTINUM: Postoperative changes in the mediastinum. Heart size and pulmonary vascularity are normal. Mediastinal contours appear intact. Calcification of the aorta. BONES AND SOFT TISSUES: No acute osseous abnormality. IMPRESSION: 1. No acute cardiopulmonary pathology. 2. Postoperative changes in the mediastinum. Electronically signed by: Elsie Gravely MD 10/31/2024 06:39 PM EST RP Workstation: [id number]   US  Venous Img Lower Unilateral Right (DVT) Result Date: 10/20/2024 CLINICAL DATA:  Pain in the right lower extremity EXAM: RIGHT LOWER EXTREMITY VENOUS DOPPLER ULTRASOUND TECHNIQUE: Gray-scale sonography with compression, as well as color and duplex ultrasound, were performed to evaluate the deep venous system(s) from the level of the common femoral vein through the popliteal and proximal calf veins. COMPARISON:  None Available. FINDINGS: VENOUS Normal compressibility of the common femoral, superficial femoral, and popliteal veins, as well as the visualized calf veins. Visualized portions of profunda femoral vein and great saphenous vein unremarkable. No  filling defects to suggest DVT on grayscale or color Doppler imaging. Doppler waveforms show normal direction of venous flow, normal respiratory plasticity and response to augmentation. Limited views of the contralateral common femoral vein are unremarkable. OTHER None. Limitations: none IMPRESSION: Negative. Electronically Signed   By: Wilkie Lent M.D.   On: 10/20/2024 13:39   DG Chest 2 View Result Date: 10/16/2024 CLINICAL DATA:  Midsternal chest pain. EXAM: CHEST - 2 VIEW COMPARISON:  October 05, 2024 FINDINGS: Multiple sternal wires and vascular clips are noted. The heart size and mediastinal contours are within normal limits. A coronary artery stent is in place. There is marked severity  calcification of the aortic arch. Both lungs are clear. The visualized skeletal structures are unremarkable. IMPRESSION: 1. Evidence of prior median sternotomy/CABG. 2. No active cardiopulmonary disease. Electronically Signed   By: Suzen Dials M.D.   On: 10/16/2024 20:19      Subjective: Seen and examined the day of discharge.  Stable no distress.  Appropriate for discharge home.  Discharge Exam: Vitals:   11/13/24 0559 11/13/24 0845  BP:  130/88  Pulse:  87  Resp: 16 18  Temp:  97.8 F (36.6 C)  SpO2:  98%   Vitals:   11/13/24 0452 11/13/24 0518 11/13/24 0559 11/13/24 0845  BP: 114/86   130/88  Pulse: (!) 53   87  Resp: 19 13 16 18   Temp: 97.6 F (36.4 C)   97.8 F (36.6 C)  TempSrc: Oral     SpO2: 90%   98%    General: Pt is alert, awake, not in acute distress Cardiovascular: RRR, S1/S2 +, no rubs, no gallops Respiratory: CTA bilaterally, no wheezing, no rhonchi Abdominal: Soft, NT, ND, bowel sounds + Extremities: no edema, no cyanosis    The results of significant diagnostics from this hospitalization (including imaging, microbiology, ancillary and laboratory) are listed below for reference.     Microbiology: Recent Results (from the past 240 hours)  Resp panel by RT-PCR (RSV, Flu A&B, Covid) Anterior Nasal Swab     Status: Abnormal   Collection Time: 11/11/24 11:02 AM   Specimen: Anterior Nasal Swab  Result Value Ref Range Status   SARS Coronavirus 2 by RT PCR NEGATIVE NEGATIVE Final    Comment: (NOTE) SARS-CoV-2 target nucleic acids are NOT DETECTED.  The SARS-CoV-2 RNA is generally detectable in upper respiratory specimens during the acute phase of infection. The lowest concentration of SARS-CoV-2 viral copies this assay can detect is 138 copies/mL. A negative result does not preclude SARS-Cov-2 infection and should not be used as the sole basis for treatment or other patient management decisions. A negative result may occur with  improper specimen  collection/handling, submission of specimen other than nasopharyngeal swab, presence of viral mutation(s) within the areas targeted by this assay, and inadequate number of viral copies(<138 copies/mL). A negative result must be combined with clinical observations, patient history, and epidemiological information. The expected result is Negative.  Fact Sheet for Patients:  bloggercourse.com  Fact Sheet for Healthcare Providers:  seriousbroker.it  This test is no t yet approved or cleared by the [place]  FDA and  has been authorized for detection and/or diagnosis of SARS-CoV-2 by FDA under an Emergency Use Authorization (EUA). This EUA will remain  in effect (meaning this test can be used) for the duration of the COVID-19 declaration under Section 564(b)(1) of the Act, 21 U.S.C.section 360bbb-3(b)(1), unless the authorization is terminated  or revoked sooner.  Influenza A by PCR POSITIVE (A) NEGATIVE Final   Influenza B by PCR NEGATIVE NEGATIVE Final    Comment: (NOTE) The Xpert Xpress SARS-CoV-2/FLU/RSV plus assay is intended as an aid in the diagnosis of influenza from Nasopharyngeal swab specimens and should not be used as a sole basis for treatment. Nasal washings and aspirates are unacceptable for Xpert Xpress SARS-CoV-2/FLU/RSV testing.  Fact Sheet for Patients: bloggercourse.com  Fact Sheet for Healthcare Providers: seriousbroker.it  This test is not yet approved or cleared by the [place]  FDA and has been authorized for detection and/or diagnosis of SARS-CoV-2 by FDA under an Emergency Use Authorization (EUA). This EUA will remain in effect (meaning this test can be used) for the duration of the COVID-19 declaration under Section 564(b)(1) of the Act, 21 U.S.C. section 360bbb-3(b)(1), unless the authorization is terminated or revoked.     Resp Syncytial  Virus by PCR NEGATIVE NEGATIVE Final    Comment: (NOTE) Fact Sheet for Patients: bloggercourse.com  Fact Sheet for Healthcare Providers: seriousbroker.it  This test is not yet approved or cleared by the [place]  FDA and has been authorized for detection and/or diagnosis of SARS-CoV-2 by FDA under an Emergency Use Authorization (EUA). This EUA will remain in effect (meaning this test can be used) for the duration of the COVID-19 declaration under Section 564(b)(1) of the Act, 21 U.S.C. section 360bbb-3(b)(1), unless the authorization is terminated or revoked.  Performed at [facility], [address] [place], [place] [address]      Labs: BNP (last 3 results) Recent Labs    06/30/24 2124 08/01/24 1916 08/13/24 1617  BNP 297.6* 165.3* 82.8   Basic Metabolic Panel: Recent Labs  Lab 11/06/24 2136 11/11/24 1054  NA 141 136  K 3.6 3.8  CL 101 102  CO2 30 20*  GLUCOSE 253* 166*  BUN 18 21  CREATININE 0.91 0.84  CALCIUM  9.5 9.1   Liver Function Tests: Recent Labs  Lab 11/06/24 2136  AST 15  ALT 21  ALKPHOS 66  BILITOT 0.5  PROT 6.0*  ALBUMIN 3.9   Recent Labs  Lab 11/06/24 2136  LIPASE 24   No results for input(s): AMMONIA in the last 168 hours. CBC: Recent Labs  Lab 11/06/24 2136 11/11/24 1054  WBC 6.0 8.5  HGB 17.6* 17.0  HCT 53.7* 51.9  MCV 92.3 92.8  PLT 166 154   Cardiac Enzymes: No results for input(s): CKTOTAL, CKMB, CKMBINDEX, TROPONINI in the last 168 hours. BNP: Invalid input(s): POCBNP CBG: Recent Labs  Lab 11/12/24 1224 11/12/24 1609 11/12/24 2157 11/13/24 0841 11/13/24 1153  GLUCAP 117* 205* 117* 152* 150*   D-Dimer No results for input(s): DDIMER in the last 72 hours. Hgb A1c No results for input(s): HGBA1C in the last 72 hours. Lipid Profile No results for input(s): CHOL, HDL, LDLCALC, TRIG, CHOLHDL, LDLDIRECT in the last 72  hours. Thyroid  function studies No results for input(s): TSH, T4TOTAL, T3FREE, THYROIDAB in the last 72 hours.  Invalid input(s): FREET3 Anemia work up No results for input(s): VITAMINB12, FOLATE, FERRITIN, TIBC, IRON , RETICCTPCT in the last 72 hours. Urinalysis    Component Value Date/Time   COLORURINE YELLOW (A) 11/07/2024 0324   APPEARANCEUR CLEAR (A) 11/07/2024 0324   APPEARANCEUR Clear 05/25/2019 1554   LABSPEC 1.027 11/07/2024 0324   LABSPEC 1.012 03/07/2015 2143   PHURINE 5.0 11/07/2024 0324   GLUCOSEU >=500 (A) 11/07/2024 0324   GLUCOSEU Negative 03/07/2015 2143   HGBUR NEGATIVE 11/07/2024 0324   BILIRUBINUR NEGATIVE  11/07/2024 0324   BILIRUBINUR Negative 05/25/2019 1554   BILIRUBINUR Negative 03/07/2015 2143   KETONESUR NEGATIVE 11/07/2024 0324   PROTEINUR NEGATIVE 11/07/2024 0324   UROBILINOGEN 0.2 03/26/2017 0907   NITRITE NEGATIVE 11/07/2024 0324   LEUKOCYTESUR NEGATIVE 11/07/2024 0324   LEUKOCYTESUR Negative 03/07/2015 2143   Sepsis Labs Recent Labs  Lab 11/06/24 2136 11/11/24 1054  WBC 6.0 8.5   Microbiology Recent Results (from the past 240 hours)  Resp panel by RT-PCR (RSV, Flu A&B, Covid) Anterior Nasal Swab     Status: Abnormal   Collection Time: 11/11/24 11:02 AM   Specimen: Anterior Nasal Swab  Result Value Ref Range Status   SARS Coronavirus 2 by RT PCR NEGATIVE NEGATIVE Final    Comment: (NOTE) SARS-CoV-2 target nucleic acids are NOT DETECTED.  The SARS-CoV-2 RNA is generally detectable in upper respiratory specimens during the acute phase of infection. The lowest concentration of SARS-CoV-2 viral copies this assay can detect is 138 copies/mL. A negative result does not preclude SARS-Cov-2 infection and should not be used as the sole basis for treatment or other patient management decisions. A negative result may occur with  improper specimen collection/handling, submission of specimen other than nasopharyngeal swab,  presence of viral mutation(s) within the areas targeted by this assay, and inadequate number of viral copies(<138 copies/mL). A negative result must be combined with clinical observations, patient history, and epidemiological information. The expected result is Negative.  Fact Sheet for Patients:  bloggercourse.com  Fact Sheet for Healthcare Providers:  seriousbroker.it  This test is no t yet approved or cleared by the [place]  FDA and  has been authorized for detection and/or diagnosis of SARS-CoV-2 by FDA under an Emergency Use Authorization (EUA). This EUA will remain  in effect (meaning this test can be used) for the duration of the COVID-19 declaration under Section 564(b)(1) of the Act, 21 U.S.C.section 360bbb-3(b)(1), unless the authorization is terminated  or revoked sooner.       Influenza A by PCR POSITIVE (A) NEGATIVE Final   Influenza B by PCR NEGATIVE NEGATIVE Final    Comment: (NOTE) The Xpert Xpress SARS-CoV-2/FLU/RSV plus assay is intended as an aid in the diagnosis of influenza from Nasopharyngeal swab specimens and should not be used as a sole basis for treatment. Nasal washings and aspirates are unacceptable for Xpert Xpress SARS-CoV-2/FLU/RSV testing.  Fact Sheet for Patients: bloggercourse.com  Fact Sheet for Healthcare Providers: seriousbroker.it  This test is not yet approved or cleared by the [place]  FDA and has been authorized for detection and/or diagnosis of SARS-CoV-2 by FDA under an Emergency Use Authorization (EUA). This EUA will remain in effect (meaning this test can be used) for the duration of the COVID-19 declaration under Section 564(b)(1) of the Act, 21 U.S.C. section 360bbb-3(b)(1), unless the authorization is terminated or revoked.     Resp Syncytial Virus by PCR NEGATIVE NEGATIVE Final    Comment: (NOTE) Fact Sheet for  Patients: bloggercourse.com  Fact Sheet for Healthcare Providers: seriousbroker.it  This test is not yet approved or cleared by the [place]  FDA and has been authorized for detection and/or diagnosis of SARS-CoV-2 by FDA under an Emergency Use Authorization (EUA). This EUA will remain in effect (meaning this test can be used) for the duration of the COVID-19 declaration under Section 564(b)(1) of the Act, 21 U.S.C. section 360bbb-3(b)(1), unless the authorization is terminated or revoked.  Performed at [facility], [address], [place], [place] [address]  Time coordinating discharge: 40 minutes  SIGNED:   Calvin KATHEE Robson, MD  [facility] 11/13/2024, 3:03 PM Pager   If 7PM-7AM, please contact night-coverage     [1]  Allergies Allergen Reactions   Demerol  [Meperidine Hcl]    Demerol [Meperidine] Hives   Morphine      Pill form not tolerated. Can have fluid form   Sulfa Antibiotics Hives   Albuterol  Other (See Comments) and Palpitations    Pt currently uses this medication.   Empagliflozin  Other (See Comments) and Hives    Perineal pain  Other Reaction(s): Other (See Comments)   Morphine  Sulfate Nausea And Vomiting, Rash and Other (See Comments)    Pt states that he is only allergic to the tablet form of this medication.

## 2024-11-13 NOTE — Plan of Care (Signed)

## 2024-11-14 ENCOUNTER — Telehealth: Payer: Self-pay

## 2024-11-14 ENCOUNTER — Other Ambulatory Visit: Payer: Self-pay | Admitting: Family Medicine

## 2024-11-14 ENCOUNTER — Other Ambulatory Visit: Payer: Self-pay

## 2024-11-14 DIAGNOSIS — I5042 Chronic combined systolic (congestive) and diastolic (congestive) heart failure: Secondary | ICD-10-CM

## 2024-11-14 NOTE — Patient Outreach (Signed)
 Social Drivers of Health  Community Resource and Care Coordination Visit Note   11/14/2024  Name: GAVON MAJANO MRN: [id number] DOB:22-Aug-1956  Situation: Referral received for [place] needs assessment and assistance related to Transportation. I obtained verbal consent from Patient.  Visit completed with Patient on the phone.   Background:      Assessment:   Goals Addressed             This Visit's Progress    BSW Goal       Current SDOH Barriers:  Transportation  Interventions: Provided patient with information about [employer]. SW contacted [employer] [phone] to inquire if they covered [facility]. [name] will contact SW back to inform if they cover [place] or just [employer]. Call received from Kevin and they do cover [facility]. Patient is waiting for a decision form DSS on Medicaid application.  Thersia Hoar, BSW, [facility] [place]  [facility] Social Worker, Population Health [phone]           Recommendation:   Wait on decision from Medicaid.  Follow Up Plan:   Telephone follow-up 11/23/24 at 11am  Thersia Hoar, BSW, [facility] [place]  [facility] Social Worker, Population Health [phone]

## 2024-11-14 NOTE — Patient Instructions (Signed)
 Visit Information  Thank you for taking time to visit with me today. Please don't hesitate to contact me if I can be of assistance to you before our next scheduled telephone appointment.  Our next appointment is by telephone on 11/22/24 at 11:00 AM  Following is a copy of your care plan:   Goals Addressed             This Visit's Progress    VBCI Transitions of Care [facility]) Care Plan       Problems: Readmitted 11/11/24 through 11/13/24 [facility]) Care plan reactivated 11/14/24 Recent Hospitalization for treatment of COPD; Atrial Fib and Influenza A Knowledge Deficit Related to post hospital care COPD exacerbation, No Hospital Follow Up Provider appointment patient desires to make own appointments, and SDOH barrier: Patient currently declines any needs - 11/14/24 just waiting on DSS to reactivate Medicaid, currently getting rides to appointments 10/31/24 Patient states his transportation needs for appointments that he lost his Medicaid until he gets information back to DSS by 11/06/24 11/06/24 Patient got application sent to DSS today awaiting Medicaid re-enrollment. 11/14/24 Awaiting to hear from DSS, especially regarding restarting tr Goal:  Over the next 30 days, the patient will not experience hospital readmission  Interventions:   COPD Interventions: Advised patient to engage in light exercise as tolerated 3-5 days a week to aid in the the management of COPD Advised patient to track and manage COPD triggers Advised patient to self assesses COPD action plan zone and make appointment with provider if in the yellow zone for 48 hours without improvement Assessed social determinant of health barriers Discussed the importance of adequate rest and management of fatigue with COPD Provided education about and advised patient to utilize infection prevention strategies to reduce risk of respiratory infection Screening for signs and symptoms of depression related to chronic disease state  Use of  home oxygen  prn, states usually at night since CPAP mask is broken 11/14/24  Patient Self Care Activities:  Attend all scheduled provider appointments Call pharmacy for medication refills 3-7 days in advance of running out of medications Call provider office for new concerns or questions  Notify RN Care Manager of [facility] call rescheduling needs Participate in Transition of Care Program/Attend TOC scheduled calls Take medications as prescribed   11/14/24 Reviewed new medications and patient verbalized already started today. [name] continues to assist with medication management as reviewed in AVS.  Plan:  The patient has been provided with contact information for the care management team and has been advised to call with any health related questions or concerns.   TOC Consent to Disclose Health Information (11/14/24 - patient continues to agree)  Following recent discharge, the patient has enrolled in the Transition of Care [facility]) Program and requested that post-discharge communication be directed to a designated support person. The patient has consented for the [facility] team to share information with the individual(s) named below to support care coordination, medication review, and follow-up planning.  Authorized Individual(s): - Name:  Zeshan Sena - Relationship to Patient:  grandson - Contact Info: [phone] Patient verbalized understanding of the consent and confirmed their request for the [facility] team to communicate with the above-named individual(s) for the duration of the [facility] program enrollment. Patient was informed that this consent may be revoked at any time.   10/23/24 Discussed and offered 30 day TOC program.  Patient agrees to weekly calls in 30 day program.  The patient has been provided with contact information for the care management team and  has been advised to call with any health -related questions or concerns.  The patient verbalized understanding with current plan of care.  The patient  is directed to their insurance card regarding availability of benefits coverage.  Discussion with patient regarding follow up with counseling and states he's awaiting a call with referral made in November. Offered LCSW follow up and patient agrees, that might help till I get to the counselor.  Denies any immediate needs today. Will make referral. 10/31/24 Referral to BSW regarding transportation needs due to receiving notice of Medicaid application needed to restart benefits and upcoming appointment sand need for PCP appointment. 10/31/24 at 1416:  Contact PCP office regarding ongoing BP elevated and BP meds changed at last hospitalization. Spoke with Amera and Christine Triage at [facility] regarding BP concerns and appointment needs for hospital follow up issues due to transportation. 11/06/24 Continue to follow up with Medicaid and [name] to help patient get to appointment with Dr. Gasper on 11/10/24 and patient to have BP cuff calibrated. Follow up for possible ulcer with stomach when eating. 11/14/24 Reactivated the 30 day TOC program for weekly calls. Reminded patient of VBCI Social Worker appointment to follow up today around 12 NOON, verbalized understanding.        The patient verbalized understanding of instructions, educational materials, and care plan provided today and DECLINED offer to receive copy of patient instructions, educational materials, and care plan.   Telephone follow up appointment with care management team member scheduled for: The patient has been provided with contact information for the care management team and has been advised to call with any health related questions or concerns.   Please call the care guide team at [phone] if you need to cancel or reschedule your appointment.   Please call the Suicide and Crisis Lifeline: 988 call the [place]  National Suicide Prevention Lifeline: (830) 242-2802 or TTY: 647-804-7852 TTY (949)871-5039) to talk to a  trained counselor call 1-800-273-TALK (toll free, 24 hour hotline) call 911 if you are experiencing a Mental Health or Behavioral Health Crisis or need someone to talk to.  Richerd Fish, RN, BSN, CCM [facility], [facility] Management Coordinator Direct Dial: [phone]

## 2024-11-14 NOTE — Transitions of Care (Post Inpatient/ED Visit) (Addendum)
 "  11/14/2024  Name: Lawrence [name] MRN: [id number] DOB: 06/24/1956  Today's TOC FU Call Status: Today's TOC FU Call Status:: Successful TOC FU Call Completed TOC FU Call Complete Date: 11/14/24  Patient's Name and Date of Birth confirmed. Name, DOB  Transition Care Management Follow-up Telephone Call Date of Discharge: 11/13/24 Discharge Facility: [facility]) Type of Discharge: Inpatient Admission Primary Inpatient Discharge Diagnosis:: COPD; Atrial Fib Any questions or concerns?: No  Items Reviewed: Medications obtained,verified, and reconciled?: Yes (Medications Reviewed) Any new allergies since your discharge?: No Dietary orders reviewed?: Yes Type of Diet Ordered:: Heart Healthy Do you have support at home?: Yes People in Home [RPT]: grandchild(ren) Name of Support/Comfort Primary Source: [name], caregiver  Medications Reviewed Today: Medications Reviewed Today     Reviewed by Eilleen Richerd GRADE, RN (Registered Nurse) on 11/14/24 at 1049  Med List Status: <None>   Medication Order Taking? Sig Documenting Provider Last Dose Status Informant  Accu-Chek Softclix Lancets lancets [id number]  Use as instructed to check sugar daily for type 2 diabetes. [name], MD  Active Self  acetaminophen  (TYLENOL ) 325 MG tablet [id number] Yes Take 2 tablets (650 mg total) by mouth every 6 (six) hours as needed for mild pain (pain score 1-3) or fever (or Fever >/= 101). Dorinda Drue DASEN, MD  Active Self           Med Note [name], RICHERD GRADE Kitchens Oct 23, 2024  9:32 AM) Needs to pick some up  albuterol  (PROVENTIL ) (2.5 MG/3ML) 0.083% nebulizer solution [id number] Yes Take 3 mLs (2.5 mg total) by nebulization every 6 (six) hours as needed for wheezing or shortness of breath. [name], MD  Active Self           Med Note [facility], TIFFANY A   Wed Oct 18, 2024  2:57 PM) prn  allopurinol  (ZYLOPRIM ) 300 MG tablet [id number] Yes TAKE 1 TABLET BY MOUTH TWICE A DAY  [name], MD  Active Self  ALPRAZolam  (XANAX ) 1 MG tablet [id number]  TAKE 1 TABLET BY MOUTH 3 TIMES A DAY AS NEEDED [name], MD  Active Self           Med Note [name]   Sat Nov 11, 2024  3:28 PM) Takes 3 times a day not prn  apixaban  (ELIQUIS ) 5 MG TABS tablet [id number] Yes Take 1 tablet (5 mg total) by mouth 2 (two) times daily. [name], MD  Active Self  atorvastatin  (LIPITOR ) 80 MG tablet [id number] Yes TAKE 1 TABLET BY MOUTH AT BEDTIME [name], MD  Active Self  Blood Glucose Monitoring Suppl (ACCU-CHEK GUIDE) w/Device KIT [id number] Yes Use to check blood sugars as directed [name], MD  Active Self  Blood Pressure Monitor DEVI [id number] Yes Use to check blood pressure daily [name], MD  Active Self  BREZTRI  AEROSPHERE 160-9-4.8 MCG/ACT AERO inhaler [id number] Yes INHALE 2 PUFFS BY MOUTH INTO THE LUNGS 2TIMES DAILY [name], MD  Active Self  cetirizine  (ZYRTEC ) 10 MG tablet [id number] Yes Take 1 tablet (10 mg total) by mouth at bedtime. [name], MD  Active Self  cholecalciferol  (VITAMIN D3) 25 MCG (1000 UNIT) tablet [id number] Yes Take 1,000 Units by mouth daily. [provider]  Active Self  Cyanocobalamin  (VITAMIN B-12) 1000 MCG SUBL [id number] Yes Place 1 tablet under the tongue daily. [provider]  Active Self  dapagliflozin  propanediol (FARXIGA ) 10 MG  TABS tablet [id number] Yes TAKE 1 TABLET BY MOUTH DAILY [name], MD  Active Self  famotidine  (PEPCID ) 20 MG tablet [id number] Yes TAKE 1 TABLET BY MOUTH TWICE A DAY [name], MD  Active Self  glucose blood (ACCU-CHEK GUIDE) test strip [id number]  Use as instructed to check sugar daily for type 2 diabetes. [name], MD  Active Self  iron  polysaccharides [facility] 150) 150 MG capsule [id number] Yes Take one capsul every Monday, Wednesday, and Friday [name], MD  Active Self  metFORMIN  (GLUCOPHAGE -XR) 500 MG 24 hr tablet  [id number] Yes TAKE 1 TABLET BY MOUTH DAILY WITH EVENING MEAL [name], MD  Active Self  methocarbamol  (ROBAXIN ) 500 MG tablet [id number] Yes TAKE 1 TABLET BY MOUTH EVERY 8 HOURS AS NEEDED FOR MUSCLE SPASMS [name], MD  Active Self           Med Note [name]   Sat Nov 11, 2024  3:26 PM) Takes q8h not PRN  metoprolol  succinate (TOPROL -XL) 25 MG 24 hr tablet [id number]  Take 1.5 tablets (37.5 mg total) by mouth daily.  Patient taking differently: Take 25 mg by mouth daily.   Dorinda Drue DASEN, MD  Active Self  nitroGLYCERIN  (NITROSTAT ) 0.4 MG SL tablet [id number] Yes Place 1 tablet (0.4 mg total) under the tongue every 5 (five) minutes as needed for chest pain (Do not take if blood pressure is low, systolic BP<110). [name], MD  Active Self           Med Note [facility], TIFFANY A   Wed Oct 18, 2024  3:00 PM) prn  omega-3 acid ethyl esters (LOVAZA ) 1 g capsule [id number] Yes TAKE 4 CAPSULES (4 GRAMS TOTAL) BY WARDEN [name], MD  Active Self  oseltamivir  (TAMIFLU ) 75 MG capsule [id number] Yes Take 1 capsule (75 mg total) by mouth 2 (two) times daily for 6 doses. Jhonny Calvin NOVAK, MD  Active   oxyCODONE  (ROXICODONE ) 5 MG immediate release tablet [id number]  Take 1 tablet (5 mg total) by mouth every 8 (eight) hours as needed.  Patient taking differently: Take 5 mg by mouth every 6 (six) hours.   Jhonny Calvin NOVAK, MD  Active Self  oxyCODONE -acetaminophen  (PERCOCET) 10-325 MG tablet [id number]  TAKE 1 TABLET BY MOUTH EVERY 4 HOURS AS NEEDED FOR PAIN [name], MD  Active Self           Med Note [name], [name] Nov 14, 2024 10:48 AM) Patient taking every 4 hours scheduled currently  OXYGEN  [id number] Yes Inhale 2 L into the lungs at bedtime as needed (for shortness of breath). [provider]  Active Self  pantoprazole  (PROTONIX ) 40 MG tablet [id number]  TAKE 1 TABLET BY MOUTH 2 TIMES DAILY (30MINUTES BEFORE A MEAL)  Patient taking differently:  40 mg daily.   [name], MD  Active Self  predniSONE  (DELTASONE ) 20 MG tablet [id number] Yes Take 2 tablets (40 mg total) by mouth daily with breakfast for 3 days. Jhonny Calvin NOVAK, MD  Active   sacubitril -valsartan  (ENTRESTO ) 49-51 MG [id number] Yes Take 1 tablet by mouth 2 (two) times daily. [name], MD  Active Self  traZODone  (DESYREL ) 150 MG tablet [id number]  TAKE 1 TABLET BY MOUTH AT BEDTIME AS NEEDED FOR SLEEP.  Patient taking differently: Take 150 mg by mouth at bedtime. for sleep   [name], MD  Active Self  Med Note [name]   Dju Nov 11, 2024  3:23 PM)    Med List Note [name], Richerd GRADE, [place] 10/23/24 1218): 10/23/24 Medications reviewed with grandson [name] patient had not discontinued the medications listed to stop on AVS: STOP taking: isosorbide  mononitrate 120 MG 24 hr tablet (IMDUR ) ondansetron  4 MG tablet (Zofran ). [name] state he has removed them at this time  06/16/24 Reviewed with grandson, [name] with patient's permission today 06/29/24 Medications reviewed with grandson, [name] who assist in managing patient's medications, Patient's medications list from 06/23/24 AVS reviewed and patient to restart Imdur  and Metoprolol . Patient didn't have Metoprolol . He is to check cabinet where his paused medications were.            Home Care and Equipment/Supplies: Were Home Health Services Ordered?: NA Any new equipment or medical supplies ordered?: NA  Functional Questionnaire: Do you need assistance with bathing/showering or dressing?: No Do you need assistance with meal preparation?: No Do you need assistance with eating?: No Do you have difficulty maintaining continence: No Do you need assistance with getting out of bed/getting out of a chair/moving?: No Do you have difficulty managing or taking your medications?: No  Follow up appointments reviewed: PCP Follow-up appointment confirmed?: Yes Date of PCP follow-up appointment?:  11/24/24 Follow-up Provider: Dr. Nancyann Perry Specialist [facility] Follow-up appointment confirmed?: NA Do you need transportation to your follow-up appointment?: No (Awaiting approval from DSS about Medicaid, grandson's friend and son helping get to appointments)  SDOH Interventions Today    Flowsheet Row Most Recent Value  SDOH Interventions   Food Insecurity Interventions Intervention Not Indicated  Housing Interventions Intervention Not Indicated  Transportation Interventions AMB Referral, Intervention Not Indicated  [Referral already in place and patient awaiting to hear back from DSS to see if Medicaid has been reactivated, patient has been getting to appointments currently with family and friends]  Utilities Interventions Intervention Not Indicated    Goals Addressed             This Visit's Progress    VBCI Transitions of Care (TOC) Care Plan       Problems: Readmitted 11/11/24 through 11/13/24 [facility]) Care plan reactivated 11/14/24 Recent Hospitalization for treatment of COPD; Atrial Fib and Influenza A Knowledge Deficit Related to post hospital care COPD exacerbation, No Hospital Follow Up Provider appointment patient desires to make own appointments, and SDOH barrier: Patient currently declines any needs - 11/14/24 just waiting on DSS to reactivate Medicaid, currently getting rides to appointments 10/31/24 Patient states his transportation needs for appointments that he lost his Medicaid until he gets information back to DSS by 11/06/24 11/06/24 Patient got application sent to DSS today awaiting Medicaid re-enrollment. 11/14/24 Awaiting to hear from DSS, especially regarding restarting tr Goal:  Over the next 30 days, the patient will not experience hospital readmission  Interventions:   COPD Interventions: Advised patient to engage in light exercise as tolerated 3-5 days a week to aid in the the management of COPD Advised patient to track and manage COPD triggers Advised  patient to self assesses COPD action plan zone and make appointment with provider if in the yellow zone for 48 hours without improvement Assessed social determinant of health barriers Discussed the importance of adequate rest and management of fatigue with COPD Provided education about and advised patient to utilize infection prevention strategies to reduce risk of respiratory infection Screening for signs and symptoms of depression related to chronic disease state  Use of home oxygen  prn, states  usually at night since CPAP mask is broken 11/14/24  Patient Self Care Activities:  Attend all scheduled provider appointments Call pharmacy for medication refills 3-7 days in advance of running out of medications Call provider office for new concerns or questions  Notify RN Care Manager of [facility] call rescheduling needs Participate in Transition of Care Program/Attend TOC scheduled calls Take medications as prescribed   11/14/24 Reviewed new medications and patient verbalized already started today. [name] continues to assist with medication management as reviewed in AVS.  Plan:  The patient has been provided with contact information for the care management team and has been advised to call with any health related questions or concerns.   TOC Consent to Disclose Health Information (11/14/24 - patient continues to agree)  Following recent discharge, the patient has enrolled in the Transition of Care [facility]) Program and requested that post-discharge communication be directed to a designated support person. The patient has consented for the [facility] team to share information with the individual(s) named below to support care coordination, medication review, and follow-up planning.  Authorized Individual(s): - Name:  Tatsuo Musial - Relationship to Patient:  grandson - Contact Info: [phone] Patient verbalized understanding of the consent and confirmed their request for the [facility] team to communicate with the  above-named individual(s) for the duration of the [facility] program enrollment. Patient was informed that this consent may be revoked at any time.   10/23/24 Discussed and offered 30 day TOC program.  Patient agrees to weekly calls in 30 day program.  The patient has been provided with contact information for the care management team and has been advised to call with any health -related questions or concerns.  The patient verbalized understanding with current plan of care.  The patient is directed to their insurance card regarding availability of benefits coverage.  Discussion with patient regarding follow up with counseling and states he's awaiting a call with referral made in November. Offered LCSW follow up and patient agrees, that might help till I get to the counselor.  Denies any immediate needs today. Will make referral. 10/31/24 Referral to BSW regarding transportation needs due to receiving notice of Medicaid application needed to restart benefits and upcoming appointment sand need for PCP appointment. 10/31/24 at 1416:  Contact PCP office regarding ongoing BP elevated and BP meds changed at last hospitalization. Spoke with Amera and Christine Triage at [facility] regarding BP concerns and appointment needs for hospital follow up issues due to transportation. 11/06/24 Continue to follow up with Medicaid and [name] to help patient get to appointment with Dr. Gasper on 11/10/24 and patient to have BP cuff calibrated. Follow up for possible ulcer with stomach when eating. 11/14/24 Reactivated the 30 day TOC program for weekly calls. Reminded patient of VBCI Social Worker appointment to follow up today around 12 NOON, verbalized understanding.        Richerd Fish, RN, BSN, CCM [facility], [facility] Management Coordinator Direct Dial: [phone]        "

## 2024-11-14 NOTE — Patient Instructions (Signed)
 Visit Information  Thank you for taking time to visit with me today. Please don't hesitate to contact me if I can be of assistance to you before our next scheduled appointment.  Your next care management appointment is by telephone on 11/23/24 at 11am    Please call the care guide team at [phone] if you need to cancel, schedule, or reschedule an appointment.   Please call the Suicide and Crisis Lifeline: 988 call the [place]  National Suicide Prevention Lifeline: 831-179-0550 or TTY: 3204752031 TTY 281-677-3922) to talk to a trained counselor call 1-800-273-TALK (toll free, 24 hour hotline) call 911 if you are experiencing a Mental Health or Behavioral Health Crisis or need someone to talk to.  Thersia Hoar, HEDWIG, MHA [place]  [facility] Social Worker, Population Health [phone]

## 2024-11-15 ENCOUNTER — Encounter: Payer: Self-pay | Admitting: Oncology

## 2024-11-15 ENCOUNTER — Telehealth: Payer: Self-pay

## 2024-11-17 ENCOUNTER — Encounter: Payer: Self-pay | Admitting: Oncology

## 2024-11-17 ENCOUNTER — Emergency Department

## 2024-11-17 ENCOUNTER — Inpatient Hospital Stay
Admission: EM | Admit: 2024-11-17 | Discharge: 2024-11-19 | DRG: 191 | Disposition: A | Discharge status: Home / Self Care | Attending: Student in an Organized Health Care Education/Training Program | Admitting: Student in an Organized Health Care Education/Training Program

## 2024-11-17 ENCOUNTER — Other Ambulatory Visit: Payer: Self-pay

## 2024-11-17 DIAGNOSIS — F32A Depression, unspecified: Secondary | ICD-10-CM | POA: Diagnosis present

## 2024-11-17 DIAGNOSIS — Z818 Family history of other mental and behavioral disorders: Secondary | ICD-10-CM

## 2024-11-17 DIAGNOSIS — Z833 Family history of diabetes mellitus: Secondary | ICD-10-CM

## 2024-11-17 DIAGNOSIS — Z79899 Other long term (current) drug therapy: Secondary | ICD-10-CM

## 2024-11-17 DIAGNOSIS — Z825 Family history of asthma and other chronic lower respiratory diseases: Secondary | ICD-10-CM

## 2024-11-17 DIAGNOSIS — Z951 Presence of aortocoronary bypass graft: Secondary | ICD-10-CM

## 2024-11-17 DIAGNOSIS — Z66 Do not resuscitate: Secondary | ICD-10-CM | POA: Diagnosis present

## 2024-11-17 DIAGNOSIS — E1151 Type 2 diabetes mellitus with diabetic peripheral angiopathy without gangrene: Secondary | ICD-10-CM | POA: Diagnosis present

## 2024-11-17 DIAGNOSIS — J441 Chronic obstructive pulmonary disease with (acute) exacerbation: Secondary | ICD-10-CM | POA: Diagnosis not present

## 2024-11-17 DIAGNOSIS — I2510 Atherosclerotic heart disease of native coronary artery without angina pectoris: Secondary | ICD-10-CM | POA: Diagnosis present

## 2024-11-17 DIAGNOSIS — Z1152 Encounter for screening for COVID-19: Secondary | ICD-10-CM

## 2024-11-17 DIAGNOSIS — Z7951 Long term (current) use of inhaled steroids: Secondary | ICD-10-CM

## 2024-11-17 DIAGNOSIS — Z885 Allergy status to narcotic agent status: Secondary | ICD-10-CM

## 2024-11-17 DIAGNOSIS — G4733 Obstructive sleep apnea (adult) (pediatric): Secondary | ICD-10-CM | POA: Diagnosis present

## 2024-11-17 DIAGNOSIS — Z7901 Long term (current) use of anticoagulants: Secondary | ICD-10-CM

## 2024-11-17 DIAGNOSIS — J9611 Chronic respiratory failure with hypoxia: Secondary | ICD-10-CM | POA: Diagnosis present

## 2024-11-17 DIAGNOSIS — Z8249 Family history of ischemic heart disease and other diseases of the circulatory system: Secondary | ICD-10-CM

## 2024-11-17 DIAGNOSIS — G8929 Other chronic pain: Secondary | ICD-10-CM | POA: Diagnosis present

## 2024-11-17 DIAGNOSIS — I482 Chronic atrial fibrillation, unspecified: Secondary | ICD-10-CM | POA: Diagnosis present

## 2024-11-17 DIAGNOSIS — Z87891 Personal history of nicotine dependence: Secondary | ICD-10-CM

## 2024-11-17 DIAGNOSIS — K219 Gastro-esophageal reflux disease without esophagitis: Secondary | ICD-10-CM | POA: Diagnosis present

## 2024-11-17 DIAGNOSIS — I739 Peripheral vascular disease, unspecified: Secondary | ICD-10-CM | POA: Diagnosis present

## 2024-11-17 DIAGNOSIS — I110 Hypertensive heart disease with heart failure: Secondary | ICD-10-CM | POA: Diagnosis present

## 2024-11-17 DIAGNOSIS — E1165 Type 2 diabetes mellitus with hyperglycemia: Secondary | ICD-10-CM | POA: Diagnosis present

## 2024-11-17 DIAGNOSIS — I252 Old myocardial infarction: Secondary | ICD-10-CM

## 2024-11-17 DIAGNOSIS — Z888 Allergy status to other drugs, medicaments and biological substances status: Secondary | ICD-10-CM

## 2024-11-17 DIAGNOSIS — R072 Precordial pain: Secondary | ICD-10-CM | POA: Diagnosis present

## 2024-11-17 DIAGNOSIS — Z860101 Personal history of adenomatous and serrated colon polyps: Secondary | ICD-10-CM

## 2024-11-17 DIAGNOSIS — Z882 Allergy status to sulfonamides status: Secondary | ICD-10-CM

## 2024-11-17 DIAGNOSIS — Z7984 Long term (current) use of oral hypoglycemic drugs: Secondary | ICD-10-CM

## 2024-11-17 DIAGNOSIS — Z955 Presence of coronary angioplasty implant and graft: Secondary | ICD-10-CM

## 2024-11-17 DIAGNOSIS — F419 Anxiety disorder, unspecified: Secondary | ICD-10-CM | POA: Diagnosis present

## 2024-11-17 DIAGNOSIS — Z85828 Personal history of other malignant neoplasm of skin: Secondary | ICD-10-CM

## 2024-11-17 DIAGNOSIS — I5022 Chronic systolic (congestive) heart failure: Secondary | ICD-10-CM | POA: Diagnosis present

## 2024-11-17 DIAGNOSIS — Z820 Family history of epilepsy and other diseases of the nervous system: Secondary | ICD-10-CM

## 2024-11-17 DIAGNOSIS — R079 Chest pain, unspecified: Secondary | ICD-10-CM | POA: Diagnosis present

## 2024-11-17 DIAGNOSIS — Z9981 Dependence on supplemental oxygen: Secondary | ICD-10-CM

## 2024-11-17 DIAGNOSIS — E119 Type 2 diabetes mellitus without complications: Secondary | ICD-10-CM

## 2024-11-17 LAB — CBC WITH DIFFERENTIAL/PLATELET
Abs Immature Granulocytes: 0.21 K/uL — ABNORMAL HIGH (ref 0.00–0.07)
Basophils Absolute: 0 K/uL (ref 0.0–0.1)
Basophils Relative: 1 %
Eosinophils Absolute: 0 K/uL (ref 0.0–0.5)
Eosinophils Relative: 0 %
HCT: 48.5 % (ref 39.0–52.0)
Hemoglobin: 16.2 g/dL (ref 13.0–17.0)
Immature Granulocytes: 3 %
Lymphocytes Relative: 27 %
Lymphs Abs: 1.8 K/uL (ref 0.7–4.0)
MCH: 30.9 pg (ref 26.0–34.0)
MCHC: 33.4 g/dL (ref 30.0–36.0)
MCV: 92.6 fL (ref 80.0–100.0)
Monocytes Absolute: 0.5 K/uL (ref 0.1–1.0)
Monocytes Relative: 8 %
Neutro Abs: 4.1 K/uL (ref 1.7–7.7)
Neutrophils Relative %: 61 %
Platelets: 173 K/uL (ref 150–400)
RBC: 5.24 MIL/uL (ref 4.22–5.81)
RDW: 15.9 % — ABNORMAL HIGH (ref 11.5–15.5)
WBC: 6.7 K/uL (ref 4.0–10.5)
nRBC: 0 % (ref 0.0–0.2)

## 2024-11-17 LAB — PRO BRAIN NATRIURETIC PEPTIDE: Pro Brain Natriuretic Peptide: 954 pg/mL — ABNORMAL HIGH

## 2024-11-17 LAB — COMPREHENSIVE METABOLIC PANEL WITH GFR
ALT: 26 U/L (ref 0–44)
AST: 21 U/L (ref 15–41)
Albumin: 3.5 g/dL (ref 3.5–5.0)
Alkaline Phosphatase: 61 U/L (ref 38–126)
Anion gap: 9 (ref 5–15)
BUN: 20 mg/dL (ref 8–23)
CO2: 27 mmol/L (ref 22–32)
Calcium: 9.1 mg/dL (ref 8.9–10.3)
Chloride: 103 mmol/L (ref 98–111)
Creatinine, Ser: 0.75 mg/dL (ref 0.61–1.24)
GFR, Estimated: >60 mL/min
Glucose, Bld: 239 mg/dL — ABNORMAL HIGH (ref 70–99)
Potassium: 3.4 mmol/L — ABNORMAL LOW (ref 3.5–5.1)
Sodium: 140 mmol/L (ref 135–145)
Total Bilirubin: 0.4 mg/dL (ref 0.0–1.2)
Total Protein: 5.3 g/dL — ABNORMAL LOW (ref 6.5–8.1)

## 2024-11-17 LAB — TROPONIN T, HIGH SENSITIVITY
Troponin T High Sensitivity: 24 ng/L — ABNORMAL HIGH (ref 0–19)
Troponin T High Sensitivity: 20 ng/L — ABNORMAL HIGH (ref 0–19)

## 2024-11-17 MED ORDER — IOHEXOL 300 MG/ML  SOLN
75.0000 mL | Freq: Once | INTRAMUSCULAR | Status: AC | PRN
  Administered 2024-11-17: 75 mL via INTRAVENOUS

## 2024-11-17 MED ORDER — KETOROLAC TROMETHAMINE 30 MG/ML IJ SOLN
15.0000 mg | Freq: Once | INTRAMUSCULAR | Status: AC
  Administered 2024-11-17: 15 mg via INTRAVENOUS
  Filled 2024-11-17: qty 1

## 2024-11-17 MED ORDER — FENTANYL CITRATE (PF) 50 MCG/ML IJ SOSY
50.0000 ug | PREFILLED_SYRINGE | Freq: Once | INTRAMUSCULAR | Status: AC
  Administered 2024-11-17: 50 ug via INTRAVENOUS
  Filled 2024-11-17: qty 1

## 2024-11-17 MED ORDER — IPRATROPIUM-ALBUTEROL 0.5-2.5 (3) MG/3ML IN SOLN
3.0000 mL | Freq: Once | RESPIRATORY_TRACT | Status: AC
  Administered 2024-11-17: 3 mL via RESPIRATORY_TRACT
  Filled 2024-11-17: qty 3

## 2024-11-17 MED ORDER — FENTANYL CITRATE (PF) 50 MCG/ML IJ SOSY
25.0000 ug | PREFILLED_SYRINGE | Freq: Once | INTRAMUSCULAR | Status: AC
  Administered 2024-11-17: 25 ug via INTRAVENOUS
  Filled 2024-11-17: qty 1

## 2024-11-17 MED ORDER — METHYLPREDNISOLONE SODIUM SUCC 125 MG IJ SOLR
125.0000 mg | Freq: Once | INTRAMUSCULAR | Status: AC
  Administered 2024-11-17: 125 mg via INTRAVENOUS
  Filled 2024-11-17: qty 2

## 2024-11-17 MED ORDER — SODIUM CHLORIDE 0.9 % IV BOLUS
500.0000 mL | Freq: Once | INTRAVENOUS | Status: AC
  Administered 2024-11-17: 500 mL via INTRAVENOUS

## 2024-11-17 MED ORDER — ONDANSETRON HCL 4 MG/2ML IJ SOLN
4.0000 mg | Freq: Once | INTRAMUSCULAR | Status: AC
  Administered 2024-11-17: 4 mg via INTRAVENOUS
  Filled 2024-11-17: qty 2

## 2024-11-17 NOTE — ED Triage Notes (Addendum)
 Pt to ED BIB EMS with c/o 8/10 chest pain that began around 1430 this afternoon. Received 324mg  ASA and 3 sprays of nitroglycerin  with no relief of pain. Pt with cardiac hx including stents. EKG with EMS afib which pt reports is his normal. On 2L [place], also baseline. Of note, pt with recent admission last week for flu and COPD exacerbation

## 2024-11-17 NOTE — ED Provider Notes (Signed)
 "  [facility] Provider Note    Event Date/Time   First MD Initiated Contact with Patient 11/17/24 1940     (approximate)   History   Chest Pain   HPI  Lawrence [name] is a 69 y.o. male with a past medical history of atrial fibrillation, COPD, presenting to the emergency department via EMS complaining of chest pain and shortness of breath which began around 2:30 PM this afternoon.  The patient reports using his inhalers and nebulizers at home with very little relief.  He was given 325 mg of aspirin  and 3 sprays of nitroglycerin  and route but denies any relief of his symptoms.  He is on 2 L nasal cannula which is his baseline.  He reports that he was just admitted here last week for COPD exacerbation with the flu.     Physical Exam   Triage Vital Signs: ED Triage Vitals  Encounter Vitals Group     BP 11/17/24 1940 115/66     Girls Systolic BP Percentile --      Girls Diastolic BP Percentile --      Boys Systolic BP Percentile --      Boys Diastolic BP Percentile --      Pulse Rate 11/17/24 1940 98     Resp 11/17/24 1940 16     Temp 11/17/24 1940 98.1 F (36.7 C)     Temp Source 11/17/24 1940 Oral     SpO2 11/17/24 1934 94 %     Weight 11/17/24 1938 176 lb 2.4 oz (79.9 kg)     Height 11/17/24 1938 5' 7 (1.702 m)     Head Circumference --      Peak Flow --      Pain Score 11/17/24 1937 8     Pain Loc --      Pain Education --      Exclude from Growth Chart --     Most recent vital signs: Vitals:   11/17/24 1934 11/17/24 1940  BP:  115/66  Pulse:  98  Resp:  16  Temp:  98.1 F (36.7 C)  SpO2: 94% 93%     General: Awake, no distress.  CV:  Good peripheral perfusion.  Resp:  Normal effort.  Abd:  No distention.  Other:     ED Results / Procedures / Treatments   Labs (all labs ordered are listed, but only abnormal results are displayed) Labs Reviewed  PRO BRAIN NATRIURETIC PEPTIDE  COMPREHENSIVE METABOLIC PANEL WITH GFR  CBC WITH  DIFFERENTIAL/PLATELET  TROPONIN T, HIGH SENSITIVITY     EKG  ED ECG REPORT I, Reche CHRISTELLA Leventhal, the attending physician, personally viewed and interpreted this ECG.  Date: 11/17/2024 Rate: 101 bpm Rhythm: Atrial fibrillation QRS Axis: normal Intervals: normal ST/T Wave abnormalities: normal Narrative Interpretation: no evidence of acute ischemia    RADIOLOGY CXR: Diffuse increased interstitial opacity compared to prior, possible interstitial inflammatory process.  Chest CT: 1. Mild emphysema. Diffuse bilateral bronchial wall thickening with mild areas of clustered nodularity within the bilateral lungs, findings are suggestive of respiratory infection/bronchitis. 2. Mild distal esophageal thickening, could be due to inflammation or reflux. 3. Aortic atherosclerosis.    PROCEDURES:  Critical Care performed: No  Procedures   MEDICATIONS ORDERED IN ED: Medications  ipratropium-albuterol  (DUONEB) 0.5-2.5 (3) MG/3ML nebulizer solution 3 mL (has no administration in time range)  sodium chloride  0.9 % bolus 500 mL (has no administration in time range)  methylPREDNISolone  sodium succinate (SOLU-MEDROL ) 125  mg/2 mL injection 125 mg (has no administration in time range)     IMPRESSION / MDM / ASSESSMENT AND PLAN / ED COURSE  I reviewed the triage vital signs and the nursing notes.                              Differential diagnosis includes, but is not limited to, ACS, aortic dissection, pulmonary embolism, pleural effusion, COPD exacerbation, CHF exacerbation, cardiac tamponade, pneumothorax, pneumonia, pericarditis, myocarditis, GI-related causes including esophagitis/gastritis, and musculoskeletal chest wall pain.     Patient's presentation is most consistent with exacerbation of chronic illness.  Patient is a 69 year old male with a past medical history of CAD, COPD, atrial fibrillation, presenting to the emergency department via EMS complaining of pain 8 out of  10 chest pain which began around 2:30 PM this afternoon.  He did not have any relief of symptoms with aspirin  or nitroglycerin .  Patient's lab work is overall unremarkable.  Imaging showed diffuse bronchial wall thickening which may be related to a bronchitis/respiratory infection as well as emphysema.  Despite treatment the patient reports that he is still having significant wheezing and he states that he does not feel comfortable going home because he does not think he could handle this on his own.  I discussed the patient's case with the hospitalist who will admit at this time.     FINAL CLINICAL IMPRESSION(S) / ED DIAGNOSES   Final diagnoses:  COPD exacerbation (HCC)     Rx / DC Orders   ED Discharge Orders     None        Note:  This document was prepared using Dragon voice recognition software and may include unintentional dictation errors.   R[place] Reche HERO, MD 11/17/24 2352  "

## 2024-11-17 NOTE — H&P (Signed)
 " History and Physical    Patient: Lawrence [name] FMW:[id number] DOB: May 24, 1956 DOA: 11/17/2024 DOS: the patient was seen and examined on 11/17/2024 PCP: [name], MD  Patient coming from: Home  Chief Complaint:  Chief Complaint  Patient presents with   Chest Pain    HPI: Lawrence [name] is a 69 y.o. male with medical history significant for CAD (s/p of stent x 4/CABG 2016), A-fib on Eliquis , HFrEF (EF 35 to 40%, 07/2024), PSVT/frequent PVCs, HTN,DM, COPD on 2L O2, OSA not using CPAP, multiple prior hospitalizations related to chest pain/CHF/COPD, most recently 12/27-12 /29/2025, , being admitted with chest pain in the setting of a COPD exacerbation.  He presented by EMS with 8 out of 10 chest pain starting at around 2:30 PM on 11/17/2024.  He was treated with aspirin  and nitroglycerin  spray en route with little improvement.  On arrival he was found to be wheezing. Vitals were within normal limits.  Labs notable for troponin at baseline of 20 and proBNP 954 up from 500 a couple weeks prior.  Glucose 239.  EKG showed A-fib at 102 with no acute ischemic changes CT chest with contrast showed mild emphysema and mild distal esophageal thickening which could be due to inflammation or reflux Patient treated with DuoNebs and Solu-Medrol  given Toradol  and fentanyl  for pain and NS bolus Admission requested     Past Medical History:  Diagnosis Date   A-fib (HCC)    Anemia    Anginal pain    Anxiety    Arthritis    Asthma    Atrial fibrillation, chronic (HCC) 10/19/2023   CAD (coronary artery disease)    a. 2002 CABGx2 (LIMA->LAD, VG->VG->OM1);  b. 09/2012 DES->OM;  c. 03/2015 PTCA of LAD [facility]) in setting of atretic LIMA; d. 05/2015 Cath [facility]): nonobs dzs; e. 06/2015 Cath ([facility]): LM nl, LAD 45p/d ISR, 50d, D1/2 small, LCX 50p/d ISR, OM1 70ost, 30 ISR, VG->OM1 50ost, 80m, LIMA->LAD 99p/d - atretic, RCA dom, nl; f.cath 10/16: 40-50%(FFR 0.90) pLAD, 75% (FFR 0.77) mLAD s/p PCI/DES, oRCA 40% (FFR0.95)    Cancer (HCC)    SKIN CANCER ON BACK   Celiac disease    Chronic diastolic CHF (congestive heart failure) (HCC)    a. 06/2009 Echo: EF 60-65%, Gr 1 DD, triv AI, mildly dil LA, nl RV.   COPD (chronic obstructive pulmonary disease) (HCC)    a. Chronic bronchitis and emphysema.   DDD (degenerative disc disease), lumbar    Diverticulosis    Dysrhythmia    Essential hypertension    GERD (gastroesophageal reflux disease)    History of hiatal hernia    History of kidney stones    H/O   History of tobacco abuse    a. Quit 2014.   Myocardial infarction [facility]) 2002   4 STENTS   Pancreatitis    PSVT (paroxysmal supraventricular tachycardia)    a. 10/2012 Noted on Zio Patch.   RLL pneumonia 05/30/2024   Sleep apnea    LOST CORD TO CPAP -ONLY 02 @ BEDTIME   Tubular adenoma of colon    Type II diabetes mellitus (HCC)    Past Surgical History:  Procedure Laterality Date   BYPASS GRAFT     CARDIAC CATHETERIZATION N/A 07/12/2015   rocedure: Left Heart Cath and Cors/Grafts Angiography;  Surgeon: Victory LELON Sharps, MD;  Location: [facility] INVASIVE CV LAB;  Service: Cardiovascular;  Laterality: N/A;   CARDIAC CATHETERIZATION Right 10/07/2015   Procedure: Left Heart Cath and Cors/Grafts Angiography;  Surgeon: Denyse DELENA Bathe, MD;  Location: [facility] INVASIVE CV LAB;  Service: Cardiovascular;  Laterality: Right;   CARDIAC CATHETERIZATION N/A 04/06/2016   Procedure: Left Heart Cath and Coronary Angiography;  Surgeon: Cara JONETTA Lovelace, MD;  Location: [facility] INVASIVE CV LAB;  Service: Cardiovascular;  Laterality: N/A;   CARDIAC CATHETERIZATION  04/06/2016   Procedure: Bypass Graft Angiography;  Surgeon: Cara JONETTA Lovelace, MD;  Location: [facility] INVASIVE CV LAB;  Service: Cardiovascular;;   CARDIAC CATHETERIZATION N/A 11/02/2016   Procedure: Left Heart Cath and Cors/Grafts Angiography and possible PCI;  Surgeon: Cara JONETTA Lovelace, MD;  Location: [facility] INVASIVE CV LAB;  Service: Cardiovascular;  Laterality: N/A;   CARDIAC  CATHETERIZATION N/A 11/02/2016   Procedure: Coronary Stent Intervention;  Surgeon: Cara JONETTA Lovelace, MD;  Location: [facility] INVASIVE CV LAB;  Service: Cardiovascular;  Laterality: N/A;   CARDIOVERSION N/A 06/29/2022   Procedure: CARDIOVERSION;  Surgeon: Hester Wolm PARAS, MD;  Location: [facility] ORS;  Service: Cardiovascular;  Laterality: N/A;   CHOLECYSTECTOMY     CIRCUMCISION N/A 06/09/2019   Procedure: CIRCUMCISION ADULT;  Surgeon: Francisca Redell BROCKS, MD;  Location: [facility] ORS;  Service: Urology;  Laterality: N/A;   COLONOSCOPY WITH PROPOFOL  N/A 04/01/2018   Procedure: COLONOSCOPY WITH PROPOFOL ;  Surgeon: Viktoria Lamar DASEN, MD;  Location: [facility] ENDOSCOPY;  Service: Endoscopy;  Laterality: N/A;   COLONOSCOPY WITH PROPOFOL  N/A 05/01/2023   Procedure: COLONOSCOPY WITH PROPOFOL ;  Surgeon: Unk Corinn Skiff, MD;  Location: [facility] ENDOSCOPY;  Service: Gastroenterology;  Laterality: N/A;   ESOPHAGEAL DILATION     ESOPHAGOGASTRODUODENOSCOPY (EGD) WITH PROPOFOL  N/A 04/01/2018   Procedure: ESOPHAGOGASTRODUODENOSCOPY (EGD) WITH PROPOFOL ;  Surgeon: Viktoria Lamar DASEN, MD;  Location: [facility] ENDOSCOPY;  Service: Endoscopy;  Laterality: N/A;   ESOPHAGOGASTRODUODENOSCOPY (EGD) WITH PROPOFOL  N/A 05/01/2023   Procedure: ESOPHAGOGASTRODUODENOSCOPY (EGD) WITH PROPOFOL ;  Surgeon: Unk Corinn Skiff, MD;  Location: [facility] ENDOSCOPY;  Service: Gastroenterology;  Laterality: N/A;   GIVENS CAPSULE STUDY  05/01/2023   Procedure: GIVENS CAPSULE STUDY;  Surgeon: Unk Corinn Skiff, MD;  Location: [facility] ENDOSCOPY;  Service: Gastroenterology;;   LEFT HEART CATH AND CORS/GRAFTS ANGIOGRAPHY N/A 06/12/2019   Procedure: LEFT HEART CATH AND CORS/GRAFTS ANGIOGRAPHY;  Surgeon: Bosie Vinie DELENA, MD;  Location: [facility] INVASIVE CV LAB;  Service: Cardiovascular;  Laterality: N/A;   LEFT HEART CATH AND CORS/GRAFTS ANGIOGRAPHY N/A 03/11/2020   Procedure: LEFT HEART CATH AND CORS/GRAFTS ANGIOGRAPHY;  Surgeon: Ammon Blunt, MD;  Location: [facility] INVASIVE CV  LAB;  Service: Cardiovascular;  Laterality: N/A;   LEFT HEART CATH AND CORS/GRAFTS ANGIOGRAPHY N/A 05/01/2021   Procedure: LEFT HEART CATH AND CORS/GRAFTS ANGIOGRAPHY;  Surgeon: Hester Wolm PARAS, MD;  Location: [facility] INVASIVE CV LAB;  Service: Cardiovascular;  Laterality: N/A;   LEFT HEART CATH AND CORS/GRAFTS ANGIOGRAPHY N/A 11/02/2022   Procedure: LEFT HEART CATH AND CORS/GRAFTS ANGIOGRAPHY;  Surgeon: Lovelace Cara JONETTA, MD;  Location: [facility] INVASIVE CV LAB;  Service: Cardiovascular;  Laterality: N/A;   TONSILLECTOMY     VASCULAR SURGERY     Social History:  reports that he quit smoking about 11 years ago. His smoking use included cigarettes. He started smoking about 62 years ago. He has a 102 pack-year smoking history. He has never used smokeless tobacco. He reports that he does not currently use drugs after having used the following drugs: Marijuana. He reports that he does not drink alcohol.  Allergies[1]  Family History  Problem Relation Age of Onset   Heart attack Mother    Depression Mother    Heart disease  Mother    COPD Mother    Hypertension Mother    Heart attack Father    Diabetes Father    Depression Father    Heart disease Father    Cirrhosis Father    Parkinson's disease Brother     Prior to Admission medications  Medication Sig Start Date End Date Taking? Authorizing Provider  Accu-Chek Softclix Lancets lancets Use as instructed to check sugar daily for type 2 diabetes. 03/03/21   [name], MD  acetaminophen  (TYLENOL ) 325 MG tablet Take 2 tablets (650 mg total) by mouth every 6 (six) hours as needed for mild pain (pain score 1-3) or fever (or Fever >/= 101). 08/15/24   Dorinda Drue DASEN, MD  albuterol  (PROVENTIL ) (2.5 MG/3ML) 0.083% nebulizer solution Take 3 mLs (2.5 mg total) by nebulization every 6 (six) hours as needed for wheezing or shortness of breath. 05/26/24   [name], MD  allopurinol  (ZYLOPRIM ) 300 MG tablet TAKE 1 TABLET BY MOUTH TWICE A DAY  04/12/24   [name], MD  ALPRAZolam  (XANAX ) 1 MG tablet TAKE 1 TABLET BY MOUTH 3 TIMES A DAY AS NEEDED 09/16/24   [name], MD  apixaban  (ELIQUIS ) 5 MG TABS tablet Take 1 tablet (5 mg total) by mouth 2 (two) times daily. 10/04/23   [name], MD  atorvastatin  (LIPITOR ) 80 MG tablet TAKE 1 TABLET BY MOUTH AT BEDTIME 07/15/23   [name], MD  Blood Glucose Monitoring Suppl (ACCU-CHEK GUIDE) w/Device KIT Use to check blood sugars as directed 12/01/23   [name], MD  Blood Pressure Monitor DEVI Use to check blood pressure daily 07/27/24   [name], MD  BREZTRI  AEROSPHERE 160-9-4.8 MCG/ACT AERO inhaler INHALE 2 PUFFS BY MOUTH INTO THE LUNGS 2TIMES DAILY 06/05/24   [name], MD  cetirizine  (ZYRTEC ) 10 MG tablet TAKE 1 TABLET BY MOUTH AT BEDTIME 11/14/24   [name], MD  cholecalciferol  (VITAMIN D3) 25 MCG (1000 UNIT) tablet Take 1,000 Units by mouth daily.    [provider]  Cyanocobalamin  (VITAMIN B-12) 1000 MCG SUBL Place 1 tablet under the tongue daily.    [provider]  dapagliflozin  propanediol (FARXIGA ) 10 MG TABS tablet TAKE 1 TABLET BY MOUTH DAILY 03/28/24   [name], MD  famotidine  (PEPCID ) 20 MG tablet TAKE 1 TABLET BY MOUTH TWICE A DAY 08/23/24   [name], MD  glucose blood (ACCU-CHEK GUIDE) test strip Use as instructed to check sugar daily for type 2 diabetes. 03/03/21   [name], MD  iron  polysaccharides (FERREX 150) 150 MG capsule Take one capsul every Monday, Wednesday, and Friday 06/09/24   [name], MD  metFORMIN  (GLUCOPHAGE -XR) 500 MG 24 hr tablet TAKE 1 TABLET BY MOUTH DAILY WITH EVENING MEAL 07/11/24   [name], MD  methocarbamol  (ROBAXIN ) 500 MG tablet TAKE 1 TABLET BY MOUTH EVERY 8 HOURS AS NEEDED FOR MUSCLE SPASMS 10/23/24   [name], MD  metoprolol  succinate (TOPROL -XL) 25 MG 24 hr tablet Take 1.5 tablets (37.5 mg total) by mouth daily. Patient taking differently:  Take 25 mg by mouth daily. 10/09/24   Dorinda Drue DASEN, MD  nitroGLYCERIN  (NITROSTAT ) 0.4 MG SL tablet Place 1 tablet (0.4 mg total) under the tongue every 5 (five) minutes as needed for chest pain (Do not take if blood pressure is low, systolic BP<110). 10/21/23   [name], MD  omega-3 acid ethyl esters (LOVAZA ) 1  g capsule TAKE 4 CAPSULES (4 GRAMS TOTAL) BY MOUTHDAILY 03/02/24   [name], MD  oxyCODONE  (ROXICODONE ) 5 MG immediate release tablet Take 1 tablet (5 mg total) by mouth every 8 (eight) hours as needed. Patient taking differently: Take 5 mg by mouth every 6 (six) hours. 11/02/24 11/02/25  Jhonny Calvin NOVAK, MD  oxyCODONE -acetaminophen  (PERCOCET) 10-325 MG tablet TAKE 1 TABLET BY MOUTH EVERY 4 HOURS AS NEEDED FOR PAIN 11/10/24   [name], MD  OXYGEN  Inhale 2 L into the lungs at bedtime as needed (for shortness of breath).    [provider]  pantoprazole  (PROTONIX ) 40 MG tablet TAKE 1 TABLET BY MOUTH 2 TIMES DAILY (30MINUTES BEFORE A MEAL) Patient taking differently: 40 mg daily. 01/12/24   [name], MD  predniSONE  (DELTASONE ) 20 MG tablet Take 2 tablets (40 mg total) by mouth daily with breakfast for 3 days. 11/14/24 11/17/24  Jhonny Calvin NOVAK, MD  sacubitril -valsartan  (ENTRESTO ) 49-51 MG TAKE 1 TABLET BY MOUTH TWICE A DAY 11/14/24   [name], MD  traZODone  (DESYREL ) 150 MG tablet TAKE 1 TABLET BY MOUTH AT BEDTIME AS NEEDED FOR SLEEP. Patient taking differently: Take 150 mg by mouth at bedtime. for sleep 09/10/24   [name], MD    Physical Exam: Vitals:   11/17/24 1938 11/17/24 1940 11/17/24 2000 11/17/24 2100  BP:  115/66 117/81 (!) 134/95  Pulse:  98 98 63  Resp:  16 14 16   Temp:  98.1 F (36.7 C) 98.2 F (36.8 C) 98.1 F (36.7 C)  TempSrc:  Oral Oral Oral  SpO2:  93% 95% 96%  Weight: 79.9 kg     Height: 5' 7 (1.702 m)      Physical Exam Vitals and nursing note reviewed.  Constitutional:      General: He is not in  acute distress. HENT:     Head: Normocephalic and atraumatic.  Cardiovascular:     Rate and Rhythm: Normal rate and regular rhythm.     Heart sounds: Normal heart sounds.  Pulmonary:     Effort: Tachypnea present.     Breath sounds: Wheezing present.  Abdominal:     Palpations: Abdomen is soft.     Tenderness: There is no abdominal tenderness.  Neurological:     Mental Status: Mental status is at baseline.     Labs on Admission: I have personally reviewed following labs and imaging studies  CBC: Recent Labs  Lab 11/11/24 1054 11/17/24 1951  WBC 8.5 6.7  NEUTROABS  --  4.1  HGB 17.0 16.2  HCT 51.9 48.5  MCV 92.8 92.6  PLT 154 173   Basic Metabolic Panel: Recent Labs  Lab 11/11/24 1054 11/17/24 1951  NA 136 140  K 3.8 3.4*  CL 102 103  CO2 20* 27  GLUCOSE 166* 239*  BUN 21 20  CREATININE 0.84 0.75  CALCIUM  9.1 9.1   GFR: Estimated Creatinine Clearance: 87 mL/min (by C-G formula based on SCr of 0.75 mg/dL). Liver Function Tests: Recent Labs  Lab 11/17/24 1951  AST 21  ALT 26  ALKPHOS 61  BILITOT 0.4  PROT 5.3*  ALBUMIN 3.5   No results for input(s): LIPASE, AMYLASE in the last 168 hours. No results for input(s): AMMONIA in the last 168 hours. Coagulation Profile: No results for input(s): INR, PROTIME in the last 168 hours. Cardiac Enzymes: No results for input(s): CKTOTAL, CKMB, CKMBINDEX, TROPONINI in the last 168 hours. BNP (last 3 results) Recent  Labs    10/31/24 1840 11/17/24 1951  PROBNP 524.0* 954.0*   HbA1C: No results for input(s): HGBA1C in the last 72 hours. CBG: Recent Labs  Lab 11/12/24 1224 11/12/24 1609 11/12/24 2157 11/13/24 0841 11/13/24 1153  GLUCAP 117* 205* 117* 152* 150*   Lipid Profile: No results for input(s): CHOL, HDL, LDLCALC, TRIG, CHOLHDL, LDLDIRECT in the last 72 hours. Thyroid  Function Tests: No results for input(s): TSH, T4TOTAL, FREET4, T3FREE, THYROIDAB in the  last 72 hours. Anemia Panel: No results for input(s): VITAMINB12, FOLATE, FERRITIN, TIBC, IRON , RETICCTPCT in the last 72 hours. Urine analysis:    Component Value Date/Time   COLORURINE YELLOW (A) 11/07/2024 0324   APPEARANCEUR CLEAR (A) 11/07/2024 0324   APPEARANCEUR Clear 05/25/2019 1554   LABSPEC 1.027 11/07/2024 0324   LABSPEC 1.012 03/07/2015 2143   PHURINE 5.0 11/07/2024 0324   GLUCOSEU >=500 (A) 11/07/2024 0324   GLUCOSEU Negative 03/07/2015 2143   HGBUR NEGATIVE 11/07/2024 0324   BILIRUBINUR NEGATIVE 11/07/2024 0324   BILIRUBINUR Negative 05/25/2019 1554   BILIRUBINUR Negative 03/07/2015 2143   KETONESUR NEGATIVE 11/07/2024 0324   PROTEINUR NEGATIVE 11/07/2024 0324   UROBILINOGEN 0.2 03/26/2017 0907   NITRITE NEGATIVE 11/07/2024 0324   LEUKOCYTESUR NEGATIVE 11/07/2024 0324   LEUKOCYTESUR Negative 03/07/2015 2143    Radiological Exams on Admission: CT Chest W Contrast Result Date: 11/17/2024 CLINICAL DATA:  Shortness of breath EXAM: CT CHEST WITH CONTRAST TECHNIQUE: Multidetector CT imaging of the chest was performed during intravenous contrast administration. RADIATION DOSE REDUCTION: This exam was performed according to the departmental dose-optimization program which includes automated exposure control, adjustment of the mA and/or kV according to patient size and/or use of iterative reconstruction technique. CONTRAST:  75mL OMNIPAQUE  IOHEXOL  300 MG/ML  SOLN COMPARISON:  Chest x-ray 11/17/2024, CT 09/12/2024 FINDINGS: Cardiovascular: Moderate aortic atherosclerosis. Ectatic ascending aorta up to 3.9 cm. Multi-vessel coronary vascular calcification. No pericardial effusion Mediastinum/Nodes: Patent trachea. No thyroid  mass. No suspicious lymph nodes. Borderline subcarinal node measuring 9 mm. Esophagus shows mild distal esophageal thickening Lungs/Pleura: Mild emphysema. No consolidation, pleural effusion or pneumothorax. Diffuse bilateral bronchial wall thickening.  Mild areas of clustered nodularity within the bilateral lungs, for example right lung base series 3, image 107. Upper Abdomen: No acute finding. Nodularity of the adrenal glands without dominant mass. Musculoskeletal: Sternotomy.  No acute osseous abnormality IMPRESSION: 1. Mild emphysema. Diffuse bilateral bronchial wall thickening with mild areas of clustered nodularity within the bilateral lungs, findings are suggestive of respiratory infection/bronchitis. 2. Mild distal esophageal thickening, could be due to inflammation or reflux. 3. Aortic atherosclerosis. Aortic Atherosclerosis (ICD10-I70.0) and Emphysema (ICD10-J43.9). Electronically Signed   By: Luke Bun M.D.   On: 11/17/2024 22:40   DG Chest Portable 1 View Result Date: 11/17/2024 CLINICAL DATA:  Chest pain EXAM: PORTABLE CHEST 1 VIEW COMPARISON:  11/11/2024, CT 09/12/2024, 09/09/2024 FINDINGS: Sternotomy. Normal cardiomediastinal silhouette with aortic atherosclerosis. Diffuse increased interstitial opacity compared to prior. No focal consolidation IMPRESSION: Diffuse increased interstitial opacity compared to prior, possible interstitial inflammatory process. Electronically Signed   By: Luke Bun M.D.   On: 11/17/2024 20:15   Data Reviewed for HPI: Relevant notes from primary care and specialist visits, past discharge summaries as available in EHR, including Care Everywhere. Prior diagnostic testing as pertinent to current admission diagnoses Updated medications and problem lists for reconciliation ED course, including vitals, labs, imaging, treatment and response to treatment Triage notes, nursing and pharmacy notes and ED provider's notes Notable results as noted above in HPI  Assessment and Plan: * Substernal precordial chest pain History of CAD s/p PCI x 4 followed by CABG 2016 History of chronic chest pain Anginal pain versus chronic chest pain First troponin 20 which is his baseline troponin and EKG  nonischemic Continue home GDMT-metoprolol , Imdur , Ranexa , atorvastatin , apixaban  Can consider cardiology consult     COPD with acute exacerbation (HCC) Chronic respiratory failure with hypoxia OSA, CPAP intolerant Scheduled and as needed DuoNebs IV steroids Antitussives, flutter valve Continue supplemental oxygen   Atrial fibrillation, chronic (HCC) Mildly elevated rate likely due to to COPD exacerbation Continue apixaban  and metoprolol    Chronic HFrEF (heart failure with reduced ejection fraction) (HCC) Clinically euvolemic Holding GDMT due to hypotension developing following admission, Toprol , Entresto , Aldactone , Farxiga , Imdur    Hyperglycemia in type 2 diabetes (HCC) Blood glucose over 200 Sliding scale insulin  coverage   PAD (peripheral artery disease) Continue atorvastatin  and apixaban    Depression with anxiety Continue trazodone  and alprazolam      DVT prophylaxis: Apixaban   Consults: none  Advance Care Planning:   Code Status: Prior   Family Communication: none  Disposition Plan: Back to previous home environment  Severity of Illness: The appropriate patient status for this patient is OBSERVATION. Observation status is judged to be reasonable and necessary in order to provide the required intensity of service to ensure the patient's safety. The patient's presenting symptoms, physical exam findings, and initial radiographic and laboratory data in the context of their medical condition is felt to place them at decreased risk for further clinical deterioration. Furthermore, it is anticipated that the patient will be medically stable for discharge from the hospital within 2 midnights of admission.   Author: Delayne LULLA Solian, MD 11/17/2024 11:57 PM  For on call review [email or web address].      [1]  Allergies Allergen Reactions   Demerol  [Meperidine Hcl]    Demerol [Meperidine] Hives   Morphine      Pill form not tolerated. Can have fluid form   Sulfa Antibiotics  Hives   Albuterol  Other (See Comments) and Palpitations    Pt currently uses this medication.   Empagliflozin  Other (See Comments) and Hives    Perineal pain  Other Reaction(s): Other (See Comments)   Morphine  Sulfate Nausea And Vomiting, Rash and Other (See Comments)    Pt states that he is only allergic to the tablet form of this medication.     "

## 2024-11-18 ENCOUNTER — Other Ambulatory Visit: Payer: Self-pay

## 2024-11-18 DIAGNOSIS — Z1152 Encounter for screening for COVID-19: Secondary | ICD-10-CM | POA: Diagnosis not present

## 2024-11-18 DIAGNOSIS — K219 Gastro-esophageal reflux disease without esophagitis: Secondary | ICD-10-CM | POA: Diagnosis present

## 2024-11-18 DIAGNOSIS — J441 Chronic obstructive pulmonary disease with (acute) exacerbation: Secondary | ICD-10-CM | POA: Diagnosis present

## 2024-11-18 DIAGNOSIS — I5022 Chronic systolic (congestive) heart failure: Secondary | ICD-10-CM | POA: Diagnosis present

## 2024-11-18 DIAGNOSIS — Z951 Presence of aortocoronary bypass graft: Secondary | ICD-10-CM | POA: Diagnosis not present

## 2024-11-18 DIAGNOSIS — R072 Precordial pain: Secondary | ICD-10-CM | POA: Diagnosis present

## 2024-11-18 DIAGNOSIS — Z8249 Family history of ischemic heart disease and other diseases of the circulatory system: Secondary | ICD-10-CM | POA: Diagnosis not present

## 2024-11-18 DIAGNOSIS — Z7984 Long term (current) use of oral hypoglycemic drugs: Secondary | ICD-10-CM | POA: Diagnosis not present

## 2024-11-18 DIAGNOSIS — G8929 Other chronic pain: Secondary | ICD-10-CM | POA: Diagnosis present

## 2024-11-18 DIAGNOSIS — Z833 Family history of diabetes mellitus: Secondary | ICD-10-CM | POA: Diagnosis not present

## 2024-11-18 DIAGNOSIS — I482 Chronic atrial fibrillation, unspecified: Secondary | ICD-10-CM | POA: Diagnosis present

## 2024-11-18 DIAGNOSIS — Z85828 Personal history of other malignant neoplasm of skin: Secondary | ICD-10-CM | POA: Diagnosis not present

## 2024-11-18 DIAGNOSIS — I110 Hypertensive heart disease with heart failure: Secondary | ICD-10-CM | POA: Diagnosis present

## 2024-11-18 DIAGNOSIS — I2510 Atherosclerotic heart disease of native coronary artery without angina pectoris: Secondary | ICD-10-CM | POA: Diagnosis present

## 2024-11-18 DIAGNOSIS — I4891 Unspecified atrial fibrillation: Secondary | ICD-10-CM | POA: Diagnosis not present

## 2024-11-18 DIAGNOSIS — F419 Anxiety disorder, unspecified: Secondary | ICD-10-CM | POA: Diagnosis present

## 2024-11-18 DIAGNOSIS — Z9981 Dependence on supplemental oxygen: Secondary | ICD-10-CM | POA: Diagnosis not present

## 2024-11-18 DIAGNOSIS — E1151 Type 2 diabetes mellitus with diabetic peripheral angiopathy without gangrene: Secondary | ICD-10-CM | POA: Diagnosis present

## 2024-11-18 DIAGNOSIS — Z66 Do not resuscitate: Secondary | ICD-10-CM | POA: Diagnosis present

## 2024-11-18 DIAGNOSIS — Z7901 Long term (current) use of anticoagulants: Secondary | ICD-10-CM | POA: Diagnosis not present

## 2024-11-18 DIAGNOSIS — J9611 Chronic respiratory failure with hypoxia: Secondary | ICD-10-CM | POA: Diagnosis present

## 2024-11-18 DIAGNOSIS — F32A Depression, unspecified: Secondary | ICD-10-CM | POA: Diagnosis present

## 2024-11-18 DIAGNOSIS — G4733 Obstructive sleep apnea (adult) (pediatric): Secondary | ICD-10-CM | POA: Diagnosis present

## 2024-11-18 DIAGNOSIS — Z87891 Personal history of nicotine dependence: Secondary | ICD-10-CM | POA: Diagnosis not present

## 2024-11-18 DIAGNOSIS — E1165 Type 2 diabetes mellitus with hyperglycemia: Secondary | ICD-10-CM | POA: Diagnosis present

## 2024-11-18 LAB — RESP PANEL BY RT-PCR (RSV, FLU A&B, COVID)  RVPGX2
Influenza A by PCR: NEGATIVE
Influenza B by PCR: NEGATIVE
Resp Syncytial Virus by PCR: NEGATIVE
SARS Coronavirus 2 by RT PCR: NEGATIVE

## 2024-11-18 LAB — CBG MONITORING, ED
Glucose-Capillary: 172 mg/dL — ABNORMAL HIGH (ref 70–99)
Glucose-Capillary: 212 mg/dL — ABNORMAL HIGH (ref 70–99)
Glucose-Capillary: 244 mg/dL — ABNORMAL HIGH (ref 70–99)
Glucose-Capillary: 210 mg/dL — ABNORMAL HIGH (ref 70–99)

## 2024-11-18 MED ORDER — PANTOPRAZOLE SODIUM 40 MG PO TBEC
40.0000 mg | DELAYED_RELEASE_TABLET | Freq: Every day | ORAL | Status: DC
  Administered 2024-11-18 – 2024-11-19 (×2): 40 mg via ORAL
  Filled 2024-11-18 (×2): qty 1

## 2024-11-18 MED ORDER — ACETAMINOPHEN 325 MG PO TABS
650.0000 mg | ORAL_TABLET | ORAL | Status: DC | PRN
  Administered 2024-11-18 (×3): 650 mg via ORAL
  Filled 2024-11-18 (×3): qty 2

## 2024-11-18 MED ORDER — OMEGA-3-ACID ETHYL ESTERS 1 G PO CAPS
1.0000 g | ORAL_CAPSULE | Freq: Every day | ORAL | Status: DC
  Administered 2024-11-18 – 2024-11-19 (×2): 1 g via ORAL
  Filled 2024-11-18 (×2): qty 1

## 2024-11-18 MED ORDER — IPRATROPIUM-ALBUTEROL 0.5-2.5 (3) MG/3ML IN SOLN
3.0000 mL | Freq: Four times a day (QID) | RESPIRATORY_TRACT | Status: DC
  Administered 2024-11-18 (×4): 3 mL via RESPIRATORY_TRACT
  Filled 2024-11-18 (×4): qty 3

## 2024-11-18 MED ORDER — OXYCODONE-ACETAMINOPHEN 10-325 MG PO TABS: 1.0000 | ORAL_TABLET | ORAL | Status: DC | PRN

## 2024-11-18 MED ORDER — METOPROLOL TARTRATE 5 MG/5ML IV SOLN
5.0000 mg | Freq: Once | INTRAVENOUS | Status: AC
  Administered 2024-11-18: 5 mg via INTRAVENOUS
  Filled 2024-11-18: qty 5

## 2024-11-18 MED ORDER — INSULIN ASPART 100 UNIT/ML IJ SOLN
0.0000 [IU] | Freq: Three times a day (TID) | INTRAMUSCULAR | Status: DC
  Administered 2024-11-18 (×3): 5 [IU] via SUBCUTANEOUS
  Administered 2024-11-19: 3 [IU] via SUBCUTANEOUS
  Filled 2024-11-18 (×3): qty 5
  Filled 2024-11-18: qty 3

## 2024-11-18 MED ORDER — ATORVASTATIN CALCIUM 80 MG PO TABS
80.0000 mg | ORAL_TABLET | Freq: Every day | ORAL | Status: DC
  Administered 2024-11-18 (×2): 80 mg via ORAL
  Filled 2024-11-18: qty 1
  Filled 2024-11-18: qty 4

## 2024-11-18 MED ORDER — ONDANSETRON HCL 4 MG/2ML IJ SOLN
4.0000 mg | Freq: Four times a day (QID) | INTRAMUSCULAR | Status: DC | PRN
  Administered 2024-11-18: 4 mg via INTRAVENOUS
  Filled 2024-11-18: qty 2

## 2024-11-18 MED ORDER — TRAZODONE HCL 50 MG PO TABS
150.0000 mg | ORAL_TABLET | Freq: Every evening | ORAL | Status: DC | PRN
  Administered 2024-11-18: 150 mg via ORAL
  Filled 2024-11-18: qty 1

## 2024-11-18 MED ORDER — METOPROLOL TARTRATE 5 MG/5ML IV SOLN
5.0000 mg | Freq: Once | INTRAVENOUS | Status: AC | PRN
  Administered 2024-11-18: 5 mg via INTRAVENOUS
  Filled 2024-11-18: qty 5

## 2024-11-18 MED ORDER — ALBUTEROL SULFATE (2.5 MG/3ML) 0.083% IN NEBU: 2.5000 mg | INHALATION_SOLUTION | RESPIRATORY_TRACT | Status: DC | PRN

## 2024-11-18 MED ORDER — METOPROLOL TARTRATE 5 MG/5ML IV SOLN: 5.0000 mg | Freq: Once | INTRAVENOUS | Status: DC

## 2024-11-18 MED ORDER — ALPRAZOLAM 0.5 MG PO TABS
1.0000 mg | ORAL_TABLET | Freq: Three times a day (TID) | ORAL | Status: DC | PRN
  Administered 2024-11-18 (×2): 1 mg via ORAL
  Filled 2024-11-18 (×2): qty 2

## 2024-11-18 MED ORDER — METHOCARBAMOL 500 MG PO TABS
500.0000 mg | ORAL_TABLET | Freq: Three times a day (TID) | ORAL | Status: DC | PRN
  Administered 2024-11-18: 500 mg via ORAL
  Filled 2024-11-18: qty 1

## 2024-11-18 MED ORDER — APIXABAN 5 MG PO TABS
5.0000 mg | ORAL_TABLET | Freq: Two times a day (BID) | ORAL | Status: DC
  Administered 2024-11-18 – 2024-11-19 (×3): 5 mg via ORAL
  Filled 2024-11-18 (×3): qty 1

## 2024-11-18 MED ORDER — METOPROLOL SUCCINATE ER 25 MG PO TB24
25.0000 mg | ORAL_TABLET | Freq: Every day | ORAL | Status: DC
  Administered 2024-11-18: 25 mg via ORAL
  Filled 2024-11-18: qty 1

## 2024-11-18 MED ORDER — IPRATROPIUM-ALBUTEROL 0.5-2.5 (3) MG/3ML IN SOLN
3.0000 mL | Freq: Three times a day (TID) | RESPIRATORY_TRACT | Status: DC
  Administered 2024-11-19: 3 mL via RESPIRATORY_TRACT
  Filled 2024-11-18: qty 3

## 2024-11-18 MED ORDER — PREDNISONE 20 MG PO TABS: 40.0000 mg | ORAL_TABLET | Freq: Every day | ORAL | Status: DC

## 2024-11-18 MED ORDER — DAPAGLIFLOZIN PROPANEDIOL 10 MG PO TABS
10.0000 mg | ORAL_TABLET | Freq: Every day | ORAL | Status: DC
  Administered 2024-11-18 – 2024-11-19 (×2): 10 mg via ORAL
  Filled 2024-11-18 (×2): qty 1

## 2024-11-18 MED ORDER — BUDESONIDE 0.5 MG/2ML IN SUSP
0.5000 mg | Freq: Two times a day (BID) | RESPIRATORY_TRACT | Status: DC
  Administered 2024-11-18 – 2024-11-19 (×3): 0.5 mg via RESPIRATORY_TRACT
  Filled 2024-11-18 (×3): qty 2

## 2024-11-18 MED ORDER — SACUBITRIL-VALSARTAN 49-51 MG PO TABS
1.0000 | ORAL_TABLET | Freq: Two times a day (BID) | ORAL | Status: DC
  Administered 2024-11-18 – 2024-11-19 (×3): 1 via ORAL
  Filled 2024-11-18 (×4): qty 1

## 2024-11-18 MED ORDER — METOPROLOL SUCCINATE ER 50 MG PO TB24
50.0000 mg | ORAL_TABLET | Freq: Every day | ORAL | Status: DC
  Administered 2024-11-19: 50 mg via ORAL
  Filled 2024-11-18: qty 1

## 2024-11-18 MED ORDER — OXYCODONE HCL 5 MG PO TABS
5.0000 mg | ORAL_TABLET | ORAL | Status: DC | PRN
  Administered 2024-11-18 (×5): 5 mg via ORAL
  Filled 2024-11-18 (×5): qty 1

## 2024-11-18 MED ORDER — OXYCODONE-ACETAMINOPHEN 5-325 MG PO TABS
1.0000 | ORAL_TABLET | ORAL | Status: DC | PRN
  Administered 2024-11-18 – 2024-11-19 (×2): 1 via ORAL
  Filled 2024-11-18 (×2): qty 1

## 2024-11-18 MED ORDER — METHYLPREDNISOLONE SODIUM SUCC 40 MG IJ SOLR
40.0000 mg | Freq: Two times a day (BID) | INTRAMUSCULAR | Status: DC
  Administered 2024-11-18: 40 mg via INTRAVENOUS
  Filled 2024-11-18: qty 1

## 2024-11-18 MED ORDER — INSULIN ASPART 100 UNIT/ML IJ SOLN: 0.0000 [IU] | Freq: Every day | INTRAMUSCULAR | Status: DC

## 2024-11-18 MED ORDER — POTASSIUM CHLORIDE CRYS ER 20 MEQ PO TBCR
40.0000 meq | EXTENDED_RELEASE_TABLET | Freq: Two times a day (BID) | ORAL | Status: AC
  Administered 2024-11-18 (×2): 40 meq via ORAL
  Filled 2024-11-18 (×2): qty 2

## 2024-11-18 MED ORDER — ALLOPURINOL 100 MG PO TABS
300.0000 mg | ORAL_TABLET | Freq: Two times a day (BID) | ORAL | Status: DC
  Administered 2024-11-18 – 2024-11-19 (×3): 300 mg via ORAL
  Filled 2024-11-18 (×2): qty 3
  Filled 2024-11-18 (×2): qty 1

## 2024-11-18 NOTE — Progress Notes (Signed)
 " PROGRESS NOTE Lawrence [name]    DOB: 04-Mar-1956, 69 y.o.  FMW:[id number]    Code Status: Limited: Do not attempt resuscitation (DNR) -DNR-LIMITED -Do Not Intubate/DNI    DOA: 11/17/2024   LOS: 0  Brief hospital course  Lawrence [name] is a 69 y.o. male with a PMH significant for CAD (s/p of stent x 4/CABG 2016), A-fib on Eliquis , HFrEF (EF 35 to 40%, 07/2024), PSVT/frequent PVCs, HTN,DM, COPD on 2L O2, OSA not using CPAP, multiple prior hospitalizations related to chest pain/CHF/COPD, most recently 12/27-12 /29/2025. They presented with chest pain and SOB.  He was treated with aspirin  and nitroglycerin  spray en route with little improvement. On arrival he was found to be wheezing. Vitals were within normal limits.  Labs notable for troponin at baseline of 20 and proBNP 954 up from 500 a couple weeks prior.  Glucose 239.   EKG: A-fib at 102 with no acute ischemic changes  CT chest with contrast showed mild emphysema and mild distal esophageal thickening which could be due to inflammation or reflux  Patient treated with DuoNebs and Solu-Medrol  given Toradol  and fentanyl  for pain and NS bolus  11/18/2024 -improved symptoms from COPD perspective. In RVR with HR up to 140s. Hemodynamically stable. Flu + 7 days ago  Assessment & Plan  Principal Problem:   COPD with acute exacerbation (HCC) Active Problems:   CAD S/P percutaneous coronary angioplasty   Substernal precordial chest pain   Chronic respiratory failure with hypoxia (HCC)   Chronic HFrEF (heart failure with reduced ejection fraction) (HCC)   Controlled type 2 diabetes mellitus without complication, without long-term current use of insulin  (HCC)   PAD (peripheral artery disease)  Atrial fibrillation RVR- HR irregular and up to 140 while in room. Hemodynamically stable. Patient is symptomatic of flutter. No chest pain or SOB currently.  - continue home metoprolol , may need increase in setting of recent flu and COPD exacerbation - add on  IV lopressor  and monitor response, will add on or adjust pending this response.  - Continue apixaban   History of CAD s/p PCI x 4 followed by CABG 2016- no pain currently History of chronic chest pain Chronic HFrEF- Clinically euvolemic Anginal pain versus chronic chest pain. Troponins trended down 24>20. Expect that there is some demand mismatch given the RVR. EKG nonischemic Continue home GDMT-metoprolol , Imdur , Ranexa , atorvastatin , apixaban  Can consider cardiology consult   COPD with acute exacerbation (HCC) Chronic respiratory failure with hypoxia- on 2L at baseline OSA- states that his CPAP mask and needs a new one Influenza A positive 7 days ago, residual effects possible Scheduled and as needed DuoNebs inhaled steroids Antitussives, flutter valve Continue supplemental oxygen  - DME CPAP mask at dc if possible. Otherwise CPAP nightly here     Hyperglycemia in type 2 diabetes (HCC) Sliding scale insulin  coverage   PAD (peripheral artery disease) Continue atorvastatin  and apixaban    Depression with anxiety Continue trazodone  and alprazolam    Chronic pain-  Continue home oxycodone  PRN  Body mass index is 27.59 kg/m.  VTE ppx:  apixaban  (ELIQUIS ) tablet 5 mg   Diet:     Diet   Diet heart healthy/carb modified Room service appropriate? Yes; Fluid consistency: Thin   Consultants: None   Subjective 11/18/2024    Pt reports feeling unwell. Has sensation of racing heart. Breathing status improved since admission.    Objective  Blood pressure (!) 127/94, pulse (!) 103, temperature 98.4 F (36.9 C), temperature source Oral, resp. rate 15, height 5'  7 (1.702 m), weight 79.9 kg, SpO2 95%.  Intake/Output Summary (Last 24 hours) at 11/18/2024 0733 Last data filed at 11/18/2024 0144 Gross per 24 hour  Intake 500 ml  Output 550 ml  Net -50 ml   Filed Weights   11/17/24 1938  Weight: 79.9 kg     Physical Exam:  General: awake, alert, NAD HEENT: atraumatic, clear  conjunctiva, anicteric sclera, MMM, hearing grossly normal Respiratory: normal respiratory effort. No wheezing Cardiovascular: extremities well perfused, quick capillary refill, rapid and irregular pulse Nervous: A&O x3. no gross focal neurologic deficits, normal speech Extremities: moves all equally, 1+ pitting edema to knees bilaterally.  normal tone Skin: dry, intact, normal temperature, normal color. No rashes, lesions or ulcers on exposed skin Psychiatry: normal mood, congruent affect  Labs   I have personally reviewed the following labs and imaging studies CBC    Component Value Date/Time   WBC 6.7 11/17/2024 1951   RBC 5.24 11/17/2024 1951   HGB 16.2 11/17/2024 1951   HGB 17.5 06/23/2024 1113   HCT 48.5 11/17/2024 1951   HCT 54.0 (H) 06/23/2024 1113   PLT 173 11/17/2024 1951   PLT 146 (L) 06/23/2024 1113   MCV 92.6 11/17/2024 1951   MCV 93 06/23/2024 1113   MCV 89 03/07/2015 1851   MCH 30.9 11/17/2024 1951   MCHC 33.4 11/17/2024 1951   RDW 15.9 (H) 11/17/2024 1951   RDW 16.9 (H) 06/23/2024 1113   RDW 14.1 03/07/2015 1851   LYMPHSABS 1.8 11/17/2024 1951   LYMPHSABS 1.7 07/21/2023 1038   LYMPHSABS 1.9 03/07/2015 1851   MONOABS 0.5 11/17/2024 1951   MONOABS 0.6 03/07/2015 1851   EOSABS 0.0 11/17/2024 1951   EOSABS 0.2 07/21/2023 1038   EOSABS 0.4 03/07/2015 1851   BASOSABS 0.0 11/17/2024 1951   BASOSABS 0.0 07/21/2023 1038   BASOSABS 0.1 03/07/2015 1851      Latest Ref Rng & Units 11/17/2024    7:51 PM 11/11/2024   10:54 AM 11/06/2024    9:36 PM  BMP  Glucose 70 - 99 mg/dL 760  833  746   BUN 8 - 23 mg/dL 20  21  18    Creatinine 0.61 - 1.24 mg/dL 9.24  9.15  9.08   Sodium 135 - 145 mmol/L 140  136  141   Potassium 3.5 - 5.1 mmol/L 3.4  3.8  3.6   Chloride 98 - 111 mmol/L 103  102  101   CO2 22 - 32 mmol/L 27  20  30    Calcium  8.9 - 10.3 mg/dL 9.1  9.1  9.5     CT Chest W Contrast Result Date: 11/17/2024 CLINICAL DATA:  Shortness of breath EXAM: CT CHEST WITH  CONTRAST TECHNIQUE: Multidetector CT imaging of the chest was performed during intravenous contrast administration. RADIATION DOSE REDUCTION: This exam was performed according to the departmental dose-optimization program which includes automated exposure control, adjustment of the mA and/or kV according to patient size and/or use of iterative reconstruction technique. CONTRAST:  75mL OMNIPAQUE  IOHEXOL  300 MG/ML  SOLN COMPARISON:  Chest x-ray 11/17/2024, CT 09/12/2024 FINDINGS: Cardiovascular: Moderate aortic atherosclerosis. Ectatic ascending aorta up to 3.9 cm. Multi-vessel coronary vascular calcification. No pericardial effusion Mediastinum/Nodes: Patent trachea. No thyroid  mass. No suspicious lymph nodes. Borderline subcarinal node measuring 9 mm. Esophagus shows mild distal esophageal thickening Lungs/Pleura: Mild emphysema. No consolidation, pleural effusion or pneumothorax. Diffuse bilateral bronchial wall thickening. Mild areas of clustered nodularity within the bilateral lungs, for example right lung base series  3, image 107. Upper Abdomen: No acute finding. Nodularity of the adrenal glands without dominant mass. Musculoskeletal: Sternotomy.  No acute osseous abnormality IMPRESSION: 1. Mild emphysema. Diffuse bilateral bronchial wall thickening with mild areas of clustered nodularity within the bilateral lungs, findings are suggestive of respiratory infection/bronchitis. 2. Mild distal esophageal thickening, could be due to inflammation or reflux. 3. Aortic atherosclerosis. Aortic Atherosclerosis (ICD10-I70.0) and Emphysema (ICD10-J43.9). Electronically Signed   By: Luke Bun M.D.   On: 11/17/2024 22:40   DG Chest Portable 1 View Result Date: 11/17/2024 CLINICAL DATA:  Chest pain EXAM: PORTABLE CHEST 1 VIEW COMPARISON:  11/11/2024, CT 09/12/2024, 09/09/2024 FINDINGS: Sternotomy. Normal cardiomediastinal silhouette with aortic atherosclerosis. Diffuse increased interstitial opacity compared to prior.  No focal consolidation IMPRESSION: Diffuse increased interstitial opacity compared to prior, possible interstitial inflammatory process. Electronically Signed   By: Luke Bun M.D.   On: 11/17/2024 20:15   Disposition Plan & Communication  Patient status: Observation  Admitted From: Home Planned disposition location: Home Anticipated discharge date: 1/4 pending RVR and breathing improvement   Family Communication: none at bedside     Author: Marien LITTIE Piety, DO [facility] 11/18/2024, 7:33 AM   Available by Epic secure chat 7AM-7PM. If 7PM-7AM, please contact night-coverage.  [facility] contact information found on [email or web address].  "

## 2024-11-18 NOTE — Evaluation (Signed)
 Physical Therapy Evaluation Patient Details Name: Lawrence [name] MRN: [id number] DOB: [birth year] Today's Date: 11/18/2024  History of Present Illness  Patient is a 69 year old male with chest pain , COPD exacerbation. Recent Flu. PMH of CAD (s/p of stent x 4/CABG 2016), A-fib on Eliquis , HFrEF, PSVT/frequent PVCs, HTN,DM, COPD on 2L O2, OSA not using CPAP, multiple prior hospitalizations related to chest pain/CHF/COPD  Clinical Impression  Patient is agreeable to PT evaluation. He reports he always uses his rolling walker for ambulation. He lives with grandson.  Today the patient is Mod I with bed mobility. He was able to stand without physical assistance required. Short distance ambulation performed with rolling walker with activity tolerance limited by fatigue. Heart rate up to 137bpm briefly with mobility. The patient feels generalized weakness compared to his baseline. Recommend to continue PT to maximize independence and facilitate return to prior level of function.       If plan is discharge home, recommend the following: A little help with walking and/or transfers;A little help with bathing/dressing/bathroom;Assistance with cooking/housework;Help with stairs or ramp for entrance;Assist for transportation   Can travel by private vehicle        Equipment Recommendations None recommended by PT  Recommendations for Other Services       Functional Status Assessment Patient has had a recent decline in their functional status and demonstrates the ability to make significant improvements in function in a reasonable and predictable amount of time.     Precautions / Restrictions Precautions Precautions: Fall Recall of Precautions/Restrictions: Intact Restrictions Weight Bearing Restrictions Per Provider Order: No      Mobility  Bed Mobility Overal bed mobility: Modified Independent             General bed mobility comments: increased time    Transfers Overall transfer  level: Needs assistance Equipment used: Rolling walker (2 wheels) Transfers: Sit to/from Stand Sit to Stand: Supervision           General transfer comment: slow but steady with rolling walker    Ambulation/Gait Ambulation/Gait assistance: Contact guard assist Gait Distance (Feet): 30 Feet Assistive device: Rolling walker (2 wheels) Gait Pattern/deviations: Step-through pattern Gait velocity: decreased     General Gait Details: slow but steady with ambulation using rolling walker. Sp02 in the 90's with variable heart rate briefly up to 137bpm  Stairs            Wheelchair Mobility     Tilt Bed    Modified Rankin (Stroke Patients Only)       Balance Overall balance assessment: Needs assistance Sitting-balance support: No upper extremity supported, Feet unsupported Sitting balance-Leahy Scale: Good     Standing balance support: Bilateral upper extremity supported Standing balance-Leahy Scale: Fair Standing balance comment: relying on rolling walker for support                             Pertinent Vitals/Pain Pain Assessment Pain Assessment: Faces Faces Pain Scale: Hurts a little bit Pain Location: chest tightness Pain Descriptors / Indicators: Discomfort, Tightness Pain Intervention(s): Limited activity within patient's tolerance, Monitored during session, Repositioned    Home Living Family/patient expects to be discharged to:: Private residence Living Arrangements: Other relatives Available Help at Discharge: Family;Available 24 hours/day Type of Home: Apartment Home Access: Level entry       Home Layout: One level Home Equipment: Shower seat - built in;Rollator (4 wheels);BSC/3in1;Hospital bed;Grab bars - toilet;Grab bars -  tub/shower      Prior Function Prior Level of Function : Independent/Modified Independent;History of Falls (last six months)             Mobility Comments: Modified independent ambulating with rollator.  2  L O2 at night (PRN during the day) ADLs Comments: Grandson assists with IADL's, groceries are delivered; uses transportation services to MD appts     Extremity/Trunk Assessment   Upper Extremity Assessment Upper Extremity Assessment: Overall WFL for tasks assessed    Lower Extremity Assessment Lower Extremity Assessment: Generalized weakness       Communication   Communication Communication: No apparent difficulties    Cognition Arousal: Alert Behavior During Therapy: WFL for tasks assessed/performed   PT - Cognitive impairments: No apparent impairments                         Following commands: Intact       Cueing Cueing Techniques: Verbal cues     General Comments General comments (skin integrity, edema, etc.): mild dizziness reported at end of walking bout. patient feels fatigued compared to his baseline    Exercises     Assessment/Plan    PT Assessment Patient needs continued PT services  PT Problem List Decreased strength;Decreased range of motion;Decreased activity tolerance;Decreased balance;Decreased mobility;Cardiopulmonary status limiting activity       PT Treatment Interventions DME instruction;Gait training;Stair training;Functional mobility training;Therapeutic activities;Therapeutic exercise;Balance training;Neuromuscular re-education;Patient/family education    PT Goals (Current goals can be found in the Care Plan section)  Acute Rehab PT Goals Patient Stated Goal: to feel better and return home PT Goal Formulation: With patient Time For Goal Achievement: 12/02/24 Potential to Achieve Goals: Fair    Frequency Min 1X/week     Co-evaluation   Reason for Co-Treatment: To address functional/ADL transfers (low endurance) PT goals addressed during session: Mobility/safety with mobility OT goals addressed during session: ADL's and self-care       AM-PAC PT 6 Clicks Mobility  Outcome Measure Help needed turning from your back to  your side while in a flat bed without using bedrails?: None Help needed moving from lying on your back to sitting on the side of a flat bed without using bedrails?: A Little Help needed moving to and from a bed to a chair (including a wheelchair)?: A Little Help needed standing up from a chair using your arms (e.g., wheelchair or bedside chair)?: A Little Help needed to walk in hospital room?: A Little Help needed climbing 3-5 steps with a railing? : A Little 6 Click Score: 19    End of Session Equipment Utilized During Treatment: Oxygen  Activity Tolerance: Patient tolerated treatment well Patient left: in bed;with call bell/phone within reach   PT Visit Diagnosis: Muscle weakness (generalized) (M62.81);Unsteadiness on feet (R26.81)    Time: 8960-8945 PT Time Calculation (min) (ACUTE ONLY): 15 min   Charges:   PT Evaluation $PT Eval Moderate Complexity: 1 Mod   PT General Charges $$ ACUTE PT VISIT: 1 Visit         Randine Essex, PT, MPT   Randine LULLA Essex 11/18/2024, 1:53 PM

## 2024-11-18 NOTE — Evaluation (Signed)
 Occupational Therapy Evaluation Patient Details Name: Lawrence [name] MRN: [id number] DOB: [birth year] Today's Date: 11/18/2024   History of Present Illness   Pt is a 69 y.o. male being admitted with chest pain in the setting of a COPD exacerbation. PMH of CAD (s/p of stent x 4/CABG 2016), A-fib on Eliquis , HFrEF (EF 35 to 40%, 07/2024), PSVT/frequent PVCs, HTN,DM, COPD on 2L O2, OSA not using CPAP, multiple prior hospitalizations related to chest pain/CHF/COPD, most recently 12/27-12 /29/2025.     Clinical Impressions Pt was seen for OT evaluation this date. PTA, pt resides in a ground level apartment with his grandson who assists with IADLs. Pt is MOD I with ADLs and mobility at baseline using a rollator. He reports use of 02 PRN. Pt presents with deficits in activity tolerance and strength limiting their ability to perform ADL management at baseline level. Pt currently requires MOD I for bed mobility and supervision for transfers. Ambulated ~30 ft using RW with CGA/SBA and good safety with no LOB during session. Limited by HR/chest tightness with HR up to 137 during ambulation bout and mild dizziness reported towards end of ambulation bout. Sp02 stable throughout session on 2L 02 via [place].  Will follow acutely to prevent decline and recommend follow up OT services upon DC.     If plan is discharge home, recommend the following:   A little help with walking and/or transfers;A little help with bathing/dressing/bathroom;Assistance with cooking/housework;Assist for transportation     Functional Status Assessment   Patient has had a recent decline in their functional status and demonstrates the ability to make significant improvements in function in a reasonable and predictable amount of time.     Equipment Recommendations   None recommended by OT;Other (comment) (has needed DME)     Recommendations for Other Services         Precautions/Restrictions   Precautions Precautions:  Fall Recall of Precautions/Restrictions: Intact Restrictions Weight Bearing Restrictions Per Provider Order: No     Mobility Bed Mobility Overal bed mobility: Modified Independent                  Transfers Overall transfer level: Needs assistance Equipment used: Rolling walker (2 wheels) Transfers: Sit to/from Stand Sit to Stand: Supervision           General transfer comment: ADL transfers/mobility using RW with CGA ~30 ft on 2L 02 with sp02 >90% and HR up to 137; good safety, limited endurance d/t HR this session      Balance Overall balance assessment: Needs assistance Sitting-balance support: No upper extremity supported, Feet unsupported Sitting balance-Leahy Scale: Good     Standing balance support: Bilateral upper extremity supported Standing balance-Leahy Scale: Good Standing balance comment: BUE support on RW, no LOB                           ADL either performed or assessed with clinical judgement   ADL Overall ADL's : Needs assistance/impaired                                     Functional mobility during ADLs: Supervision/safety;Contact guard assist;Rolling walker (2 wheels) General ADL Comments: ADL transfers/mobility using RW with CGA ~30 ft on 2L 02 with sp02 >90% and HR up to 137     Vision         Perception  Praxis         Pertinent Vitals/Pain Pain Assessment Pain Assessment: Faces Faces Pain Scale: Hurts a little bit Pain Location: chest tightness Pain Descriptors / Indicators: Discomfort, Tightness Pain Intervention(s): Monitored during session, Repositioned     Extremity/Trunk Assessment Upper Extremity Assessment Upper Extremity Assessment: Overall WFL for tasks assessed   Lower Extremity Assessment Lower Extremity Assessment: Generalized weakness       Communication Communication Communication: No apparent difficulties   Cognition Arousal: Alert Behavior During Therapy: WFL  for tasks assessed/performed                                 Following commands: Intact       Cueing  General Comments   Cueing Techniques: Verbal cues  mild dizziness reported towards end of ambulation bout that subsided upon return to bed; sp02 >90% on 2L throughout session, HR up to 137 with activity, back down to low 100s at rest   Exercises Other Exercises Other Exercises: Edu on role of OT in acute setting, ECS, pacing, and HR limitations during activity.   Shoulder Instructions      Home Living Family/patient expects to be discharged to:: Private residence Living Arrangements: Other relatives Available Help at Discharge: Family;Available 24 hours/day (grandson) Type of Home: Apartment Home Access: Level entry     Home Layout: One level     Bathroom Shower/Tub: Producer, Television/film/video: Standard Bathroom Accessibility: Yes   Home Equipment: Shower seat - built in;Rollator (4 wheels);BSC/3in1;Hospital bed;Grab bars - toilet;Grab bars - tub/shower          Prior Functioning/Environment Prior Level of Function : Independent/Modified Independent;History of Falls (last six months)             Mobility Comments: Modified independent ambulating with rollator.  2 L O2 at night (PRN during the day) ADLs Comments: [name] assists with IADL's, groceries are delivered; uses transportation services to MD appts    OT Problem List: Decreased strength;Decreased activity tolerance;Impaired balance (sitting and/or standing)   OT Treatment/Interventions: Self-care/ADL training;Therapeutic exercise;Energy conservation;Therapeutic activities;Balance training;Patient/family education      OT Goals(Current goals can be found in the care plan section)   Acute Rehab OT Goals Patient Stated Goal: feel better to go home OT Goal Formulation: With patient Time For Goal Achievement: 12/02/24 Potential to Achieve Goals: Good ADL Goals Pt Will Perform  Lower Body Bathing: with set-up;with supervision;with adaptive equipment;sitting/lateral leans;sit to/from stand Pt Will Perform Lower Body Dressing: with modified independence;sit to/from stand Additional ADL Goal #1: Pt will demo implementation of 1 learned ECS during ADL performance 2/2 trials to maximize safety, IND and prevent overexertion.   OT Frequency:  Min 2X/week    Co-evaluation PT/OT/SLP Co-Evaluation/Treatment: Yes Reason for Co-Treatment: To address functional/ADL transfers PT goals addressed during session: Mobility/safety with mobility        AM-PAC OT 6 Clicks Daily Activity     Outcome Measure Help from another person eating meals?: None Help from another person taking care of personal grooming?: A Little Help from another person toileting, which includes using toliet, bedpan, or urinal?: A Little Help from another person bathing (including washing, rinsing, drying)?: A Lot Help from another person to put on and taking off regular upper body clothing?: A Little Help from another person to put on and taking off regular lower body clothing?: A Little 6 Click Score: 18   End of Session  Equipment Utilized During Treatment: Oxygen ;Rolling walker (2 wheels) Nurse Communication: Mobility status  Activity Tolerance: Patient tolerated treatment well Patient left: in bed;with call bell/phone within reach  OT Visit Diagnosis: Unsteadiness on feet (R26.81);Muscle weakness (generalized) (M62.81);History of falling (Z91.81)                Time: 8960-8945 OT Time Calculation (min): 15 min Charges:  OT General Charges $OT Visit: 1 Visit OT Evaluation $OT Eval Low Complexity: 1 Low  Valina Maes Chrismon, OTR/L 11/18/2024, 1:33 PM  Marquesha Robideau E Chrismon 11/18/2024, 1:33 PM

## 2024-11-18 NOTE — ED Notes (Signed)
 NURSE LEXIE RN INFORMED OF ASSIGNED BED

## 2024-11-19 ENCOUNTER — Other Ambulatory Visit: Payer: Self-pay

## 2024-11-19 DIAGNOSIS — I4891 Unspecified atrial fibrillation: Secondary | ICD-10-CM

## 2024-11-19 DIAGNOSIS — J441 Chronic obstructive pulmonary disease with (acute) exacerbation: Secondary | ICD-10-CM | POA: Diagnosis not present

## 2024-11-19 LAB — GLUCOSE, CAPILLARY: Glucose-Capillary: 157 mg/dL — ABNORMAL HIGH (ref 70–99)

## 2024-11-19 LAB — HIV ANTIBODY (ROUTINE TESTING W REFLEX): HIV Screen 4th Generation wRfx: NONREACTIVE

## 2024-11-19 MED ORDER — OXYCODONE-ACETAMINOPHEN 5-325 MG PO TABS
2.0000 | ORAL_TABLET | Freq: Four times a day (QID) | ORAL | Status: DC | PRN
  Administered 2024-11-19: 2 via ORAL
  Filled 2024-11-19: qty 2

## 2024-11-19 MED ORDER — METOPROLOL SUCCINATE ER 50 MG PO TB24: 50.0000 mg | ORAL_TABLET | Freq: Every day | ORAL | 0 refills | Status: AC

## 2024-11-19 NOTE — Progress Notes (Signed)
 OXYGEN  SATURATION AT REST AND WHILE AMBULATING:  Patient's Oxygen  Saturation on Room Air @ rest: 87% Patient's Oxygen  Saturation on Room Air with exertion/ambulation: 84%  Patient's Oxygen  Saturation on 2L O2 @ rest: 97% Patient's Oxygen  Saturation on 2L o2 with exertion/ambulation: 95%

## 2024-11-19 NOTE — Discharge Summary (Signed)
 " Physician Discharge Summary  Patient: Lawrence [name] FMW:[id number] DOB: Aug 18, 1956   Code Status: Limited: Do not attempt resuscitation (DNR) -DNR-LIMITED -Do Not Intubate/DNI  Admit date: 11/17/2024 Discharge date: 11/19/2024 Disposition: Home, No home health services recommended PCP: [name], MD  Recommendations for Outpatient Follow-up:  Follow up with PCP within 1-2 weeks Regarding general hospital follow up and preventative care Recommend monitoring for afib HR control on increased dose of metoprolol  Ensure new CPAP mask as his previous one is not usable, per patient.   Discharge Diagnoses:  Principal Problem:   COPD with acute exacerbation (HCC) Active Problems:   CAD S/P percutaneous coronary angioplasty   Substernal precordial chest pain   Chronic respiratory failure with hypoxia (HCC)   Chronic HFrEF (heart failure with reduced ejection fraction) (HCC)   Controlled type 2 diabetes mellitus without complication, without long-term current use of insulin  (HCC)   PAD (peripheral artery disease)  Brief Hospital Course Summary: DMARCUS DECICCO is a 69 y.o. male with a PMH significant for CAD (s/p of stent x 4/CABG 2016), A-fib on Eliquis , HFrEF (EF 35 to 40%, 07/2024), PSVT/frequent PVCs, HTN,DM, COPD on 2L O2, OSA not using CPAP.  They presented with chest pain and SOB. Received aspirin  and nitroglycerin  spray en route with little improvement.   On arrival he was found to be wheezing. Vitals were within normal limits.  Labs notable for troponin at baseline of 20 and proBNP 954 up from 500 a couple weeks prior.  Glucose 239.   EKG: A-fib at 102 with no acute ischemic changes  CT chest with contrast showed mild emphysema and mild distal esophageal thickening which could be due to inflammation or reflux   Patient admitted and treated for COPD exacerbation with DuoNebs and Solu-Medrol . given Toradol  and fentanyl  for pain and NS bolus. His COPD condition improved quickly back  to home home 2L. Likely in setting of recent flu infection.   Additionally, he was found to be in Afib RVR with HR up to 140s. His home metoprolol  was increased to 50mg  daily and his HR became controled WNL.   PT/OT evaluated and recommended HH.   All other chronic conditions were treated with home medications.    Discharge Condition: Good, improved Recommended discharge diet: Regular healthy diet  Consultations: None   Procedures/Studies: None   Allergies as of 11/19/2024       Reactions   Demerol  [meperidine Hcl]    Demerol [meperidine] Hives   Morphine     Pill form not tolerated. Can have fluid form   Sulfa Antibiotics Hives   Albuterol  Other (See Comments), Palpitations   Pt currently uses this medication.   Empagliflozin  Other (See Comments), Hives   Perineal pain Other Reaction(s): Other (See Comments)   Morphine  Sulfate Nausea And Vomiting, Rash, Other (See Comments)   Pt states that he is only allergic to the tablet form of this medication.          Medication List     STOP taking these medications    oxyCODONE  5 MG immediate release tablet Commonly known as: Roxicodone    predniSONE  20 MG tablet Commonly known as: DELTASONE        TAKE these medications    Accu-Chek Guide test strip Generic drug: glucose blood Use as instructed to check sugar daily for type 2 diabetes.   Accu-Chek Guide w/Device Kit Use to check blood sugars as directed   Accu-Chek Softclix Lancets lancets Use as instructed to check sugar daily  for type 2 diabetes.   acetaminophen  325 MG tablet Commonly known as: TYLENOL  Take 2 tablets (650 mg total) by mouth every 6 (six) hours as needed for mild pain (pain score 1-3) or fever (or Fever >/= 101).   albuterol  (2.5 MG/3ML) 0.083% nebulizer solution Commonly known as: PROVENTIL  Take 3 mLs (2.5 mg total) by nebulization every 6 (six) hours as needed for wheezing or shortness of breath.   allopurinol  300 MG tablet Commonly  known as: ZYLOPRIM  TAKE 1 TABLET BY MOUTH TWICE A DAY   ALPRAZolam  1 MG tablet Commonly known as: XANAX  TAKE 1 TABLET BY MOUTH 3 TIMES A DAY AS NEEDED   apixaban  5 MG Tabs tablet Commonly known as: Eliquis  Take 1 tablet (5 mg total) by mouth 2 (two) times daily.   atorvastatin  80 MG tablet Commonly known as: LIPITOR  TAKE 1 TABLET BY MOUTH AT BEDTIME   Blood Pressure Monitor Devi Use to check blood pressure daily   Breztri  Aerosphere 160-9-4.8 MCG/ACT Aero inhaler Generic drug: budesonide -glycopyrrolate -formoterol  INHALE 2 PUFFS BY MOUTH INTO THE LUNGS 2TIMES DAILY   cetirizine  10 MG tablet Commonly known as: ZYRTEC  TAKE 1 TABLET BY MOUTH AT BEDTIME   cholecalciferol  25 MCG (1000 UNIT) tablet Commonly known as: VITAMIN D3 Take 1,000 Units by mouth daily.   famotidine  20 MG tablet Commonly known as: PEPCID  TAKE 1 TABLET BY MOUTH TWICE A DAY   Farxiga  10 MG Tabs tablet Generic drug: dapagliflozin  propanediol TAKE 1 TABLET BY MOUTH DAILY   iron  polysaccharides 150 MG capsule Commonly known as: Ferrex 150 Take one capsul every Monday, Wednesday, and Friday   metFORMIN  500 MG 24 hr tablet Commonly known as: GLUCOPHAGE -XR TAKE 1 TABLET BY MOUTH DAILY WITH EVENING MEAL   methocarbamol  500 MG tablet Commonly known as: ROBAXIN  TAKE 1 TABLET BY MOUTH EVERY 8 HOURS AS NEEDED FOR MUSCLE SPASMS   metoprolol  succinate 50 MG 24 hr tablet Commonly known as: TOPROL -XL Take 1 tablet (50 mg total) by mouth daily. Take with or immediately following a meal. Start taking on: November 20, 2024 What changed:  medication strength how much to take additional instructions   nitroGLYCERIN  0.4 MG SL tablet Commonly known as: NITROSTAT  Place 1 tablet (0.4 mg total) under the tongue every 5 (five) minutes as needed for chest pain (Do not take if blood pressure is low, systolic BP<110).   omega-3 acid ethyl esters 1 g capsule Commonly known as: LOVAZA  TAKE 4 CAPSULES (4 GRAMS TOTAL) BY  MOUTHDAILY   oxyCODONE -acetaminophen  10-325 MG tablet Commonly known as: PERCOCET TAKE 1 TABLET BY MOUTH EVERY 4 HOURS AS NEEDED FOR PAIN   OXYGEN  Inhale 2 L into the lungs at bedtime as needed (for shortness of breath).   pantoprazole  40 MG tablet Commonly known as: PROTONIX  TAKE 1 TABLET BY MOUTH 2 TIMES DAILY (30MINUTES BEFORE A MEAL) What changed: See the new instructions.   ranolazine  1000 MG SR tablet Commonly known as: RANEXA  Take 1,000 mg by mouth 2 (two) times daily.   sacubitril -valsartan  49-51 MG Commonly known as: ENTRESTO  TAKE 1 TABLET BY MOUTH TWICE A DAY   spironolactone  25 MG tablet Commonly known as: ALDACTONE  Take 25 mg by mouth 2 (two) times daily.   traZODone  150 MG tablet Commonly known as: DESYREL  TAKE 1 TABLET BY MOUTH AT BEDTIME AS NEEDED FOR SLEEP. What changed: when to take this   Vitamin B-12 1000 MCG Subl Place 1 tablet under the tongue daily.  Durable Medical Equipment  (From admission, onward)           Start     Ordered   11/19/24 1022  For home use only DME continuous positive airway pressure (CPAP)  Once       Comments: Patient only needs a new mask for his CPAP machine at home  Question Answer Comment  Length of Need Lifetime   Patient has OSA or probable OSA Yes   Is the patient currently using CPAP in the home Yes   Settings Other see comments   CPAP supplies needed Mask, headgear, cushions, filters, heated tubing and water chamber   Additional equipment included None      11/19/24 1022            Follow-up Information     [name], Nancyann BRAVO, MD. Schedule an appointment as soon as possible for a visit in 1 week(s).   Specialty: Family Medicine Contact information: [address] [place] [address] [phone]                 Subjective   Pt reports feeling improved. Denies CP or palpitations. Breathing is close to baseline with residual cough.   All questions and  concerns were addressed at time of discharge.  Objective  Blood pressure 114/77, pulse 79, temperature 97.6 F (36.4 C), resp. rate 17, height 5' 7 (1.702 m), weight 79.9 kg, SpO2 99%.   General: Pt is alert, awake, not in acute distress Cardiovascular: RRR, S1/S2 +, no rubs, no gallops Respiratory: CTA bilaterally, no wheezing, no rhonchi Abdominal: Soft, NT, ND, bowel sounds + Extremities: no edema, no cyanosis  The results of significant diagnostics from this hospitalization (including imaging, microbiology, ancillary and laboratory) are listed below for reference.   Imaging studies: CT Chest W Contrast Result Date: 11/17/2024 CLINICAL DATA:  Shortness of breath EXAM: CT CHEST WITH CONTRAST TECHNIQUE: Multidetector CT imaging of the chest was performed during intravenous contrast administration. RADIATION DOSE REDUCTION: This exam was performed according to the departmental dose-optimization program which includes automated exposure control, adjustment of the mA and/or kV according to patient size and/or use of iterative reconstruction technique. CONTRAST:  75mL OMNIPAQUE  IOHEXOL  300 MG/ML  SOLN COMPARISON:  Chest x-ray 11/17/2024, CT 09/12/2024 FINDINGS: Cardiovascular: Moderate aortic atherosclerosis. Ectatic ascending aorta up to 3.9 cm. Multi-vessel coronary vascular calcification. No pericardial effusion Mediastinum/Nodes: Patent trachea. No thyroid  mass. No suspicious lymph nodes. Borderline subcarinal node measuring 9 mm. Esophagus shows mild distal esophageal thickening Lungs/Pleura: Mild emphysema. No consolidation, pleural effusion or pneumothorax. Diffuse bilateral bronchial wall thickening. Mild areas of clustered nodularity within the bilateral lungs, for example right lung base series 3, image 107. Upper Abdomen: No acute finding. Nodularity of the adrenal glands without dominant mass. Musculoskeletal: Sternotomy.  No acute osseous abnormality IMPRESSION: 1. Mild emphysema. Diffuse  bilateral bronchial wall thickening with mild areas of clustered nodularity within the bilateral lungs, findings are suggestive of respiratory infection/bronchitis. 2. Mild distal esophageal thickening, could be due to inflammation or reflux. 3. Aortic atherosclerosis. Aortic Atherosclerosis (ICD10-I70.0) and Emphysema (ICD10-J43.9). Electronically Signed   By: Luke Bun M.D.   On: 11/17/2024 22:40   DG Chest Portable 1 View Result Date: 11/17/2024 CLINICAL DATA:  Chest pain EXAM: PORTABLE CHEST 1 VIEW COMPARISON:  11/11/2024, CT 09/12/2024, 09/09/2024 FINDINGS: Sternotomy. Normal cardiomediastinal silhouette with aortic atherosclerosis. Diffuse increased interstitial opacity compared to prior. No focal consolidation IMPRESSION: Diffuse increased interstitial opacity compared to prior, possible interstitial inflammatory process. Electronically  Signed   By: Luke Bun M.D.   On: 11/17/2024 20:15   DG Chest 2 View Result Date: 11/11/2024 CLINICAL DATA:  Shortness of breath.  Emphysema. EXAM: CHEST - 2 VIEW COMPARISON:  None Available. FINDINGS: The cardiopericardial silhouette is within normal limits for size. The lungs are clear without focal pneumonia, edema, pneumothorax or pleural effusion. No acute bony abnormality. Telemetry leads overlie the chest. IMPRESSION: No active cardiopulmonary disease. Electronically Signed   By: Camellia Candle M.D.   On: 11/11/2024 12:51   DG Knee Complete 4 Views Right Result Date: 11/11/2024 CLINICAL DATA:  Right knee pain EXAM: RIGHT KNEE - COMPLETE 4+ VIEW COMPARISON:  None Available. FINDINGS: No evidence of fracture, dislocation, or joint effusion. No evidence of arthropathy or other focal bone abnormality. Soft tissues are unremarkable. IMPRESSION: Negative. Electronically Signed   By: Camellia Candle M.D.   On: 11/11/2024 12:50   CT ABDOMEN PELVIS W CONTRAST Result Date: 11/07/2024 EXAM: CT ABDOMEN AND PELVIS WITH CONTRAST 11/07/2024 04:09:42 AM TECHNIQUE: CT  of the abdomen and pelvis was performed with the administration of 100 mL of iohexol  (OMNIPAQUE ) 300 MG/ML solution. Multiplanar reformatted images are provided for review. Automated exposure control, iterative reconstruction, and/or weight-based adjustment of the mA/kV was utilized to reduce the radiation dose to as low as reasonably achievable. COMPARISON: CT with IV contrast 09/09/2024 and 08/13/2024. CLINICAL HISTORY: Epigastric pain, history of pancreatitis. FINDINGS: LOWER CHEST: Lung bases are clear. There is a small hiatal hernia. The cardiac size is normal. There are coronary artery calcifications. LIVER: The liver is unremarkable. GALLBLADDER AND BILE DUCTS: The gallbladder is surgically absent. There is no biliary dilatation. SPLEEN: No acute abnormality. PANCREAS: There is no pancreatic mass enhancement, ductal dilatation, or adjacent edema. ADRENAL GLANDS: There is bilateral chronic nodular thickening in the adrenal glands consistent with adenomas. KIDNEYS, URETERS AND BLADDER: No stones in the kidneys or ureters. No hydronephrosis. No perinephric or periureteral stranding. There is no renal mass. There is a Bosniak 1 cyst in the anterior hilar cortex of the right kidney, Hounsfield density is 19. No follow-up imaging is recommended. Urinary bladder is unremarkable. GI AND BOWEL: Stomach demonstrates no acute abnormality. The unopacified small bowel is unremarkable. The appendix is normal. There is moderate retained stool increasing in the ascending and transverse colon. No thickening of the large bowel wall. There is sigmoid diverticulosis without evidence of diverticulitis. There is no bowel obstruction. PERITONEUM AND RETROPERITONEUM: No ascites. No free air. There are small umbilical and left inguinal fat hernias. No incarcerated hernia. VASCULATURE: Aorta is normal in caliber. There is moderate to heavy aortic and branch vessel atherosclerosis without aneurysm or dissection. LYMPH NODES: No  lymphadenopathy. REPRODUCTIVE ORGANS: No acute abnormality. BONES AND SOFT TISSUES: There is degenerative change of the lumbar spine. There is acquired spinal stenosis at L4-L5. No acute osseous abnormality. No focal soft tissue abnormality. IMPRESSION: 1. No acute CT findings in the abdomen or pelvis. No CT evidence of acute pancreatitis. 2. Acquired spinal stenosis at L4-L5. 3. Increased constipation. Diverticulosis. Electronically signed by: Francis Quam MD 11/07/2024 04:49 AM EST RP Workstation: [id number]   DG Chest Portable 1 View Result Date: 10/31/2024 EXAM: 1 VIEW(S) XRAY OF THE CHEST 10/31/2024 06:30:00 PM COMPARISON: 10/16/2024 CLINICAL HISTORY: chest pain FINDINGS: LUNGS AND PLEURA: No focal pulmonary opacity. No pleural effusion. No pneumothorax. HEART AND MEDIASTINUM: Postoperative changes in the mediastinum. Heart size and pulmonary vascularity are normal. Mediastinal contours appear intact. Calcification of the aorta. BONES AND SOFT  TISSUES: No acute osseous abnormality. IMPRESSION: 1. No acute cardiopulmonary pathology. 2. Postoperative changes in the mediastinum. Electronically signed by: Elsie Gravely MD 10/31/2024 06:39 PM EST RP Workstation: [id number]   US  Venous Img Lower Unilateral Right (DVT) Result Date: 10/20/2024 CLINICAL DATA:  Pain in the right lower extremity EXAM: RIGHT LOWER EXTREMITY VENOUS DOPPLER ULTRASOUND TECHNIQUE: Gray-scale sonography with compression, as well as color and duplex ultrasound, were performed to evaluate the deep venous system(s) from the level of the common femoral vein through the popliteal and proximal calf veins. COMPARISON:  None Available. FINDINGS: VENOUS Normal compressibility of the common femoral, superficial femoral, and popliteal veins, as well as the visualized calf veins. Visualized portions of profunda femoral vein and great saphenous vein unremarkable. No filling defects to suggest DVT on grayscale or color Doppler imaging. Doppler  waveforms show normal direction of venous flow, normal respiratory plasticity and response to augmentation. Limited views of the contralateral common femoral vein are unremarkable. OTHER None. Limitations: none IMPRESSION: Negative. Electronically Signed   By: Wilkie Lent M.D.   On: 10/20/2024 13:39    Labs: Basic Metabolic Panel: Recent Labs  Lab 11/17/24 1951  NA 140  K 3.4*  CL 103  CO2 27  GLUCOSE 239*  BUN 20  CREATININE 0.75  CALCIUM  9.1   CBC: Recent Labs  Lab 11/17/24 1951  WBC 6.7  NEUTROABS 4.1  HGB 16.2  HCT 48.5  MCV 92.6  PLT 173   Microbiology: Results for orders placed or performed during the hospital encounter of 11/17/24  Resp panel by RT-PCR (RSV, Flu A&B, Covid) Anterior Nasal Swab     Status: None   Collection Time: 11/17/24 11:26 PM   Specimen: Anterior Nasal Swab  Result Value Ref Range Status   SARS Coronavirus 2 by RT PCR NEGATIVE NEGATIVE Final    Comment: (NOTE) SARS-CoV-2 target nucleic acids are NOT DETECTED.  The SARS-CoV-2 RNA is generally detectable in upper respiratory specimens during the acute phase of infection. The lowest concentration of SARS-CoV-2 viral copies this assay can detect is 138 copies/mL. A negative result does not preclude SARS-Cov-2 infection and should not be used as the sole basis for treatment or other patient management decisions. A negative result may occur with  improper specimen collection/handling, submission of specimen other than nasopharyngeal swab, presence of viral mutation(s) within the areas targeted by this assay, and inadequate number of viral copies(<138 copies/mL). A negative result must be combined with clinical observations, patient history, and epidemiological information. The expected result is Negative.  Fact Sheet for Patients:  bloggercourse.com  Fact Sheet for Healthcare Providers:  seriousbroker.it  This test is no t yet  approved or cleared by the [place]  FDA and  has been authorized for detection and/or diagnosis of SARS-CoV-2 by FDA under an Emergency Use Authorization (EUA). This EUA will remain  in effect (meaning this test can be used) for the duration of the COVID-19 declaration under Section 564(b)(1) of the Act, 21 U.S.C.section 360bbb-3(b)(1), unless the authorization is terminated  or revoked sooner.       Influenza A by PCR NEGATIVE NEGATIVE Final   Influenza B by PCR NEGATIVE NEGATIVE Final    Comment: (NOTE) The Xpert Xpress SARS-CoV-2/FLU/RSV plus assay is intended as an aid in the diagnosis of influenza from Nasopharyngeal swab specimens and should not be used as a sole basis for treatment. Nasal washings and aspirates are unacceptable for Xpert Xpress SARS-CoV-2/FLU/RSV testing.  Fact Sheet for Patients: bloggercourse.com  Fact Sheet  for Healthcare Providers: seriousbroker.it  This test is not yet approved or cleared by the [place]  FDA and has been authorized for detection and/or diagnosis of SARS-CoV-2 by FDA under an Emergency Use Authorization (EUA). This EUA will remain in effect (meaning this test can be used) for the duration of the COVID-19 declaration under Section 564(b)(1) of the Act, 21 U.S.C. section 360bbb-3(b)(1), unless the authorization is terminated or revoked.     Resp Syncytial Virus by PCR NEGATIVE NEGATIVE Final    Comment: (NOTE) Fact Sheet for Patients: bloggercourse.com  Fact Sheet for Healthcare Providers: seriousbroker.it  This test is not yet approved or cleared by the [place]  FDA and has been authorized for detection and/or diagnosis of SARS-CoV-2 by FDA under an Emergency Use Authorization (EUA). This EUA will remain in effect (meaning this test can be used) for the duration of the COVID-19 declaration under Section 564(b)(1) of  the Act, 21 U.S.C. section 360bbb-3(b)(1), unless the authorization is terminated or revoked.  Performed at [facility], [address] [place], [place] [address]    *Note: Due to a large number of results and/or encounters for the requested time period, some results have not been displayed. A complete set of results can be found in Results Review.    Time coordinating discharge: Over 30 minutes  Marien LITTIE Piety, MD  [facility] 11/19/2024, 10:28 AM  "

## 2024-11-19 NOTE — Plan of Care (Signed)
  Problem: Education: Goal: Understanding of cardiac disease, CV risk reduction, and recovery process will improve Outcome: Progressing   Problem: Education: Goal: Individualized Educational Video(s) Outcome: Progressing   Problem: Activity: Goal: Ability to tolerate increased activity will improve Outcome: Progressing   Problem: Cardiac: Goal: Ability to achieve and maintain adequate cardiovascular perfusion will improve Outcome: Progressing

## 2024-11-19 NOTE — Progress Notes (Signed)
 This RN provided discharge instructions and teaching to the patient. The patient verbalized and demonstrated understanding of the provided instructions. All outstanding questions resolved. L arm PIV removed. IV catheter intact. Pt tolerated well. All belongings packed and in tow. Pt told this RN that he was not leaving until he ate lunch. Charge RN Lauraine made aware.

## 2024-11-19 NOTE — Progress Notes (Incomplete)
 " PROGRESS NOTE Lawrence [name]    DOB: 03/12/1956, 69 y.o.  FMW:[id number]    Code Status: Limited: Do not attempt resuscitation (DNR) -DNR-LIMITED -Do Not Intubate/DNI    DOA: 11/17/2024   LOS: 1  Brief hospital course  Lawrence [name] is a 69 y.o. male with a PMH significant for CAD (s/p of stent x 4/CABG 2016), A-fib on Eliquis , HFrEF (EF 35 to 40%, 07/2024), PSVT/frequent PVCs, HTN,DM, COPD on 2L O2, OSA not using CPAP, multiple prior hospitalizations related to chest pain/CHF/COPD, most recently 12/27-12 /29/2025. They presented with chest pain and SOB.  He was treated with aspirin  and nitroglycerin  spray en route with little improvement. On arrival he was found to be wheezing. Vitals were within normal limits.  Labs notable for troponin at baseline of 20 and proBNP 954 up from 500 a couple weeks prior.  Glucose 239.   EKG: A-fib at 102 with no acute ischemic changes  CT chest with contrast showed mild emphysema and mild distal esophageal thickening which could be due to inflammation or reflux  Patient treated with DuoNebs and Solu-Medrol  given Toradol  and fentanyl  for pain and NS bolus  11/19/2024 -improved symptoms from COPD perspective. In RVR with HR up to 140s. Hemodynamically stable. Flu + 7 days ago  Assessment & Plan  Principal Problem:   COPD with acute exacerbation (HCC) Active Problems:   CAD S/P percutaneous coronary angioplasty   Substernal precordial chest pain   Chronic respiratory failure with hypoxia (HCC)   Chronic HFrEF (heart failure with reduced ejection fraction) (HCC)   Controlled type 2 diabetes mellitus without complication, without long-term current use of insulin  (HCC)   PAD (peripheral artery disease)  Atrial fibrillation RVR- HR irregular and up to 140 while in room. Hemodynamically stable. Patient is symptomatic of flutter. No chest pain or SOB currently.  - continue home metoprolol , may need increase in setting of recent flu and COPD exacerbation - add on  IV lopressor  and monitor response, will add on or adjust pending this response.  - Continue apixaban   History of CAD s/p PCI x 4 followed by CABG 2016- no pain currently History of chronic chest pain Chronic HFrEF- Clinically euvolemic Anginal pain versus chronic chest pain. Troponins trended down 24>20. Expect that there is some demand mismatch given the RVR. EKG nonischemic Continue home GDMT-metoprolol , Imdur , Ranexa , atorvastatin , apixaban  Can consider cardiology consult   COPD with acute exacerbation (HCC) Chronic respiratory failure with hypoxia- on 2L at baseline OSA- states that his CPAP mask and needs a new one Influenza A positive 7 days ago, residual effects possible Scheduled and as needed DuoNebs inhaled steroids Antitussives, flutter valve Continue supplemental oxygen  - DME CPAP mask at dc if possible. Otherwise CPAP nightly here     Hyperglycemia in type 2 diabetes (HCC) Sliding scale insulin  coverage   PAD (peripheral artery disease) Continue atorvastatin  and apixaban    Depression with anxiety Continue trazodone  and alprazolam    Chronic pain-  Continue home oxycodone  PRN  Body mass index is 27.59 kg/m.  VTE ppx:  apixaban  (ELIQUIS ) tablet 5 mg   Diet:     Diet   Diet heart healthy/carb modified Room service appropriate? Yes; Fluid consistency: Thin   Consultants: None   Subjective 11/19/2024    Pt reports feeling unwell. Has sensation of racing heart. Breathing status improved since admission.    Objective  Blood pressure (!) 127/94, pulse (!) 103, temperature 98.4 F (36.9 C), temperature source Oral, resp. rate 15, height 5'  7 (1.702 m), weight 79.9 kg, SpO2 95%.  Intake/Output Summary (Last 24 hours) at 11/19/2024 0723 Last data filed at 11/19/2024 0500 Gross per 24 hour  Intake 1196 ml  Output 1475 ml  Net -279 ml   Filed Weights   11/17/24 1938  Weight: 79.9 kg     Physical Exam:  General: awake, alert, NAD HEENT: atraumatic, clear  conjunctiva, anicteric sclera, MMM, hearing grossly normal Respiratory: normal respiratory effort. No wheezing Cardiovascular: extremities well perfused, quick capillary refill, rapid and irregular pulse Nervous: A&O x3. no gross focal neurologic deficits, normal speech Extremities: moves all equally, 1+ pitting edema to knees bilaterally.  normal tone Skin: dry, intact, normal temperature, normal color. No rashes, lesions or ulcers on exposed skin Psychiatry: normal mood, congruent affect  Labs   I have personally reviewed the following labs and imaging studies CBC    Component Value Date/Time   WBC 6.7 11/17/2024 1951   RBC 5.24 11/17/2024 1951   HGB 16.2 11/17/2024 1951   HGB 17.5 06/23/2024 1113   HCT 48.5 11/17/2024 1951   HCT 54.0 (H) 06/23/2024 1113   PLT 173 11/17/2024 1951   PLT 146 (L) 06/23/2024 1113   MCV 92.6 11/17/2024 1951   MCV 93 06/23/2024 1113   MCV 89 03/07/2015 1851   MCH 30.9 11/17/2024 1951   MCHC 33.4 11/17/2024 1951   RDW 15.9 (H) 11/17/2024 1951   RDW 16.9 (H) 06/23/2024 1113   RDW 14.1 03/07/2015 1851   LYMPHSABS 1.8 11/17/2024 1951   LYMPHSABS 1.7 07/21/2023 1038   LYMPHSABS 1.9 03/07/2015 1851   MONOABS 0.5 11/17/2024 1951   MONOABS 0.6 03/07/2015 1851   EOSABS 0.0 11/17/2024 1951   EOSABS 0.2 07/21/2023 1038   EOSABS 0.4 03/07/2015 1851   BASOSABS 0.0 11/17/2024 1951   BASOSABS 0.0 07/21/2023 1038   BASOSABS 0.1 03/07/2015 1851      Latest Ref Rng & Units 11/17/2024    7:51 PM 11/11/2024   10:54 AM 11/06/2024    9:36 PM  BMP  Glucose 70 - 99 mg/dL 760  833  746   BUN 8 - 23 mg/dL 20  21  18    Creatinine 0.61 - 1.24 mg/dL 9.24  9.15  9.08   Sodium 135 - 145 mmol/L 140  136  141   Potassium 3.5 - 5.1 mmol/L 3.4  3.8  3.6   Chloride 98 - 111 mmol/L 103  102  101   CO2 22 - 32 mmol/L 27  20  30    Calcium  8.9 - 10.3 mg/dL 9.1  9.1  9.5     CT Chest W Contrast Result Date: 11/17/2024 CLINICAL DATA:  Shortness of breath EXAM: CT CHEST WITH  CONTRAST TECHNIQUE: Multidetector CT imaging of the chest was performed during intravenous contrast administration. RADIATION DOSE REDUCTION: This exam was performed according to the departmental dose-optimization program which includes automated exposure control, adjustment of the mA and/or kV according to patient size and/or use of iterative reconstruction technique. CONTRAST:  75mL OMNIPAQUE  IOHEXOL  300 MG/ML  SOLN COMPARISON:  Chest x-ray 11/17/2024, CT 09/12/2024 FINDINGS: Cardiovascular: Moderate aortic atherosclerosis. Ectatic ascending aorta up to 3.9 cm. Multi-vessel coronary vascular calcification. No pericardial effusion Mediastinum/Nodes: Patent trachea. No thyroid  mass. No suspicious lymph nodes. Borderline subcarinal node measuring 9 mm. Esophagus shows mild distal esophageal thickening Lungs/Pleura: Mild emphysema. No consolidation, pleural effusion or pneumothorax. Diffuse bilateral bronchial wall thickening. Mild areas of clustered nodularity within the bilateral lungs, for example right lung base series  3, image 107. Upper Abdomen: No acute finding. Nodularity of the adrenal glands without dominant mass. Musculoskeletal: Sternotomy.  No acute osseous abnormality IMPRESSION: 1. Mild emphysema. Diffuse bilateral bronchial wall thickening with mild areas of clustered nodularity within the bilateral lungs, findings are suggestive of respiratory infection/bronchitis. 2. Mild distal esophageal thickening, could be due to inflammation or reflux. 3. Aortic atherosclerosis. Aortic Atherosclerosis (ICD10-I70.0) and Emphysema (ICD10-J43.9). Electronically Signed   By: Luke Bun M.D.   On: 11/17/2024 22:40   DG Chest Portable 1 View Result Date: 11/17/2024 CLINICAL DATA:  Chest pain EXAM: PORTABLE CHEST 1 VIEW COMPARISON:  11/11/2024, CT 09/12/2024, 09/09/2024 FINDINGS: Sternotomy. Normal cardiomediastinal silhouette with aortic atherosclerosis. Diffuse increased interstitial opacity compared to prior.  No focal consolidation IMPRESSION: Diffuse increased interstitial opacity compared to prior, possible interstitial inflammatory process. Electronically Signed   By: Luke Bun M.D.   On: 11/17/2024 20:15   Disposition Plan & Communication  Patient status: Inpatient  Admitted From: Home Planned disposition location: Home Anticipated discharge date: 1/4 pending RVR and breathing improvement   Family Communication: none at bedside     Author: Marien LITTIE Piety, DO [facility] 11/19/2024, 7:23 AM   Available by Epic secure chat 7AM-7PM. If 7PM-7AM, please contact night-coverage.  [facility] contact information found on [email or web address].  "

## 2024-11-19 NOTE — Discharge Instructions (Addendum)
 I have prescribed metoprolol  50mg  for your heart rate control as it been elevated during your stay. On the higher dose, your heart rate is well controlled.  Please follow up with your PCP in 1-2 weeks to monitor and adjust as needed.   I checked with the case manager about your CPAP mask and they recommended you get it replaced from the company that supplies your CPAP machine.

## 2024-11-20 ENCOUNTER — Telehealth: Payer: Self-pay

## 2024-11-20 NOTE — Transitions of Care (Post Inpatient/ED Visit) (Signed)
 "  11/20/2024  Name: Lawrence [name] MRN: [id number] DOB: 03/04/1956  Today's TOC FU Call Status: Today's TOC FU Call Status:: Successful TOC FU Call Completed TOC FU Call Complete Date: 11/20/24  Patient's Name and Date of Birth confirmed. Name, DOB  Transition Care Management Follow-up Telephone Call Date of Discharge: 11/19/24 Discharge Facility: [facility]) Type of Discharge: Inpatient Admission Primary Inpatient Discharge Diagnosis:: COPD with AAcute Exacerbation How have you been since you were released from the hospital?: Better Any questions or concerns?: No  Items Reviewed: Did you receive and understand the discharge instructions provided?: Yes Medications obtained,verified, and reconciled?: Yes (Medications Reviewed) Any new allergies since your discharge?: No Dietary orders reviewed?: Yes Type of Diet Ordered:: Heart Healthy low sodium Do you have support at home?: Yes People in Home [RPT]: grandchild(ren) Name of Support/Comfort Primary Source: [name], caregiver  Medications Reviewed Today: Medications Reviewed Today     Reviewed by Eilleen Richerd GRADE, RN (Registered Nurse) on 11/20/24 at 1340  Med List Status: <None>   Medication Order Taking? Sig Documenting Provider Last Dose Status Informant  Accu-Chek Softclix Lancets lancets [id number]  Use as instructed to check sugar daily for type 2 diabetes. [name], MD  Active Self  acetaminophen  (TYLENOL ) 325 MG tablet [id number] Yes Take 2 tablets (650 mg total) by mouth every 6 (six) hours as needed for mild pain (pain score 1-3) or fever (or Fever >/= 101). Dorinda Drue DASEN, MD  Active Self           Med Note VASHTI INOCENTE PARAS   Dju Nov 18, 2024  2:17 AM)    albuterol  (PROVENTIL ) (2.5 MG/3ML) 0.083% nebulizer solution [id number] Yes Take 3 mLs (2.5 mg total) by nebulization every 6 (six) hours as needed for wheezing or shortness of breath. [name], MD  Active Self           Med Note  [facility], TIFFANY A   Wed Oct 18, 2024  2:57 PM) prn  allopurinol  (ZYLOPRIM ) 300 MG tablet [id number] Yes TAKE 1 TABLET BY MOUTH TWICE A DAY [name], MD  Active Self  ALPRAZolam  (XANAX ) 1 MG tablet [id number] Yes TAKE 1 TABLET BY MOUTH 3 TIMES A DAY AS NEEDED [name], MD  Active Self           Med Note [name]   Sat Nov 11, 2024  3:28 PM) Takes 3 times a day not prn  apixaban  (ELIQUIS ) 5 MG TABS tablet [id number] Yes Take 1 tablet (5 mg total) by mouth 2 (two) times daily. [name], MD  Active Self  atorvastatin  (LIPITOR ) 80 MG tablet [id number] Yes TAKE 1 TABLET BY MOUTH AT BEDTIME [name], MD  Active Self  Blood Glucose Monitoring Suppl (ACCU-CHEK GUIDE) w/Device KIT [id number]  Use to check blood sugars as directed [name], MD  Active Self  Blood Pressure Monitor DEVI [id number]  Use to check blood pressure daily [name], MD  Active Self  BREZTRI  AEROSPHERE 160-9-4.8 MCG/ACT AERO inhaler [id number] Yes INHALE 2 PUFFS BY MOUTH INTO THE LUNGS 2TIMES DAILY [name], MD  Active Self  cetirizine  (ZYRTEC ) 10 MG tablet [id number] Yes TAKE 1 TABLET BY MOUTH AT BEDTIME [name], MD  Active Self  cholecalciferol  (VITAMIN D3) 25 MCG (1000 UNIT) tablet [id number] Yes Take 1,000 Units by mouth daily. [provider]  Active Self  Cyanocobalamin  (VITAMIN B-12) 1000 MCG SUBL [id number] Yes  Place 1 tablet under the tongue daily. [provider]  Active Self  dapagliflozin  propanediol (FARXIGA ) 10 MG TABS tablet [id number] Yes TAKE 1 TABLET BY MOUTH DAILY [name], MD  Active Self  famotidine  (PEPCID ) 20 MG tablet [id number] Yes TAKE 1 TABLET BY MOUTH TWICE A DAY [name], MD  Active Self  glucose blood (ACCU-CHEK GUIDE) test strip [id number]  Use as instructed to check sugar daily for type 2 diabetes. [name], MD  Active Self  iron  polysaccharides [facility] 150) 150 MG capsule [id number] Yes Take one capsul  every Monday, Wednesday, and Friday [name], MD  Active Self  metFORMIN  (GLUCOPHAGE -XR) 500 MG 24 hr tablet [id number] Yes TAKE 1 TABLET BY MOUTH DAILY WITH EVENING MEAL [name], MD  Active Self  methocarbamol  (ROBAXIN ) 500 MG tablet [id number] Yes TAKE 1 TABLET BY MOUTH EVERY 8 HOURS AS NEEDED FOR MUSCLE SPASMS [name], MD  Active Self           Med Note [name]   Sat Nov 11, 2024  3:26 PM) Takes q8h not PRN  metoprolol  succinate (TOPROL -XL) 50 MG 24 hr tablet [id number] Yes Take 1 tablet (50 mg total) by mouth daily. Take with or immediately following a meal. [name], MD  Active   nitroGLYCERIN  (NITROSTAT ) 0.4 MG SL tablet [id number] Yes Place 1 tablet (0.4 mg total) under the tongue every 5 (five) minutes as needed for chest pain (Do not take if blood pressure is low, systolic BP<110). [name], MD  Active Self           Med Note [facility], TIFFANY A   Wed Oct 18, 2024  3:00 PM) prn  omega-3 acid ethyl esters (LOVAZA ) 1 g capsule [id number] Yes TAKE 4 CAPSULES (4 GRAMS TOTAL) BY WARDEN [name], MD  Active Self  oxyCODONE -acetaminophen  (PERCOCET) 10-325 MG tablet [id number] Yes TAKE 1 TABLET BY MOUTH EVERY 4 HOURS AS NEEDED FOR PAIN [name], MD  Active Self           Med Note [name], [name] Nov 14, 2024 10:48 AM) Patient taking every 4 hours scheduled currently  OXYGEN  [id number]  Inhale 2 L into the lungs at bedtime as needed (for shortness of breath). [provider]  Active Self  pantoprazole  (PROTONIX ) 40 MG tablet [id number] Yes TAKE 1 TABLET BY MOUTH 2 TIMES DAILY (30MINUTES BEFORE A MEAL) [name], MD  Active Self  ranolazine  (RANEXA ) 1000 MG SR tablet [id number] Yes Take 1,000 mg by mouth 2 (two) times daily. [provider]  Active Pharmacy Records  sacubitril -valsartan  (ENTRESTO ) 49-51 MG [id number] Yes TAKE 1 TABLET BY MOUTH TWICE A DAY [name], MD  Active   spironolactone   (ALDACTONE ) 25 MG tablet [id number] Yes Take 25 mg by mouth 2 (two) times daily. [provider]  Active Pharmacy Records  traZODone  (DESYREL ) 150 MG tablet [id number]  TAKE 1 TABLET BY MOUTH AT BEDTIME AS NEEDED FOR SLEEP.  Patient taking differently: Take 150 mg by mouth at bedtime. for sleep   [name], MD  Active Self           Med Note [name]   Dju Nov 11, 2024  3:23 PM)    Med List Note [name], Richerd CINDERELLA, [place] 10/23/24 1218): 10/23/24 Medications reviewed with grandson [name] patient had not discontinued the medications listed to stop on AVS: STOP taking: isosorbide  mononitrate  120 MG 24 hr tablet (IMDUR ) ondansetron  4 MG tablet (Zofran ). [name] state he has removed them at this time  06/16/24 Reviewed with grandson, [name] with patient's permission today 06/29/24 Medications reviewed with grandson, [name] who assist in managing patient's medications, Patient's medications list from 06/23/24 AVS reviewed and patient to restart Imdur  and Metoprolol . Patient didn't have Metoprolol . He is to check cabinet where his paused medications were.            Home Care and Equipment/Supplies: Were Home Health Services Ordered?: NA Any new equipment or medical supplies ordered?: NA  Functional Questionnaire: Do you need assistance with bathing/showering or dressing?: No Do you need assistance with meal preparation?: No Do you need assistance with eating?: No Do you have difficulty maintaining continence: No Do you need assistance with getting out of bed/getting out of a chair/moving?: No Do you have difficulty managing or taking your medications?: Yes (Sometimes but [name] helps me alot to remember)  Follow up appointments reviewed: PCP Follow-up appointment confirmed?: Yes Date of PCP follow-up appointment?: 11/24/24 Follow-up Provider: Nancyann Perry Specialist [facility] Follow-up appointment confirmed?: NA Do you need transportation to your follow-up appointment?: No (Approved  for Medicaid and transporation will call to set up) Do you understand care options if your condition(s) worsen?: Yes-patient verbalized understanding  SDOH Interventions Today    Flowsheet Row Most Recent Value  SDOH Interventions   Food Insecurity Interventions Intervention Not Indicated  Housing Interventions Intervention Not Indicated  Transportation Interventions Intervention Not Indicated, AMB Referral  [Patient states he has his Medicaid re-instated and will have transportation for appointments. Patient is followed by DESIREE ROBINS as well.]  Utilities Interventions Intervention Not Indicated    Goals Addressed             This Visit's Progress    [facility] Transitions of Care (TOC) Care Plan       Problems: Readmitted 11/17/2024 to 11/19/2024 with COPD exacerbation with recent Influenza and Atrial Fibrillation: reviewed with patient  Readmitted 11/11/24 through 11/13/24 [facility]) Care plan reactivated 11/14/24 Recent Hospitalization for treatment of COPD; Atrial Fib and Influenza A Knowledge Deficit Related to post hospital care COPD exacerbation, No Hospital Follow Up Provider appointment patient desires to make own appointments, and SDOH barrier: Patient currently declines any needs - 11/14/24 just waiting on DSS to reactivate Medicaid, currently getting rides to appointments 10/31/24 Patient states his transportation needs for appointments that he lost his Medicaid until he gets information back to DSS by 11/06/24 11/06/24 Patient got application sent to DSS today awaiting Medicaid re-enrollment. 11/14/24 Awaiting to hear from DSS, especially regarding restarting tr Goal:  Over the next 30 days, the patient will not experience hospital readmission  Interventions:   COPD Interventions: Advised patient to engage in light exercise as tolerated 3-5 days a week to aid in the the management of COPD Advised patient to track and manage COPD triggers Advised patient to self assesses COPD action  plan zone and make appointment with provider if in the yellow zone for 48 hours without improvement Assessed social determinant of health barriers Discussed the importance of adequate rest and management of fatigue with COPD Provided education about and advised patient to utilize infection prevention strategies to reduce risk of respiratory infection Screening for signs and symptoms of depression related to chronic disease state  Use of home oxygen  prn, states usually at night since CPAP mask is broken 11/14/24 patient states he has not sent mask back but didn't like wearing CPAP as well.  Patient Self Care Activities:  Attend all scheduled provider appointments Call pharmacy for medication refills 3-7 days in advance of running out of medications Call provider office for new concerns or questions  Notify RN Care Manager of [facility] call rescheduling needs Participate in Transition of Care Program/Attend TOC scheduled calls Take medications as prescribed   11/14/24 Reviewed new medications and patient verbalized already started today. [name] continues to assist with medication management as reviewed in AVS. 11/20/24 Patient medications reviewed and acknowledges changes see medication review. Patient encouraged to have BP cuff calibrated as he states EMS each time says his BP cuff continues to read much higher. Patient has been encouraged to have BP cuff calibrated or to obtain a new BP cuff.  Plan:  The patient has been provided with contact information for the care management team and has been advised to call with any health related questions or concerns.   TOC Consent to Disclose Health Information (11/14/24 - patient continues to agree)  Reviewed 11/20/24 and continues to agree to [place] as authorized for any follow up Following recent discharge, the patient has enrolled in the Transition of Care [facility]) Program and requested that post-discharge communication be directed to a designated support person. The  patient has consented for the [facility] team to share information with the individual(s) named below to support care coordination, medication review, and follow-up planning.  Authorized Individual(s): - Name:  Katelyn Kohlmeyer - Relationship to Patient:  grandson - Contact Info: [phone] Patient verbalized understanding of the consent and confirmed their request for the [facility] team to communicate with the above-named individual(s) for the duration of the [facility] program enrollment. Patient was informed that this consent may be revoked at any time.   10/23/24 Discussed and offered 30 day TOC program.  Patient agrees to weekly calls in 30 day program.  The patient has been provided with contact information for the care management team and has been advised to call with any health -related questions or concerns.  The patient verbalized understanding with current plan of care.  The patient is directed to their insurance card regarding availability of benefits coverage.  Discussion with patient regarding follow up with counseling and states he's awaiting a call with referral made in November. Offered LCSW follow up and patient agrees, that might help till I get to the counselor.  Denies any immediate needs today. Will make referral. 10/31/24 Referral to BSW regarding transportation needs due to receiving notice of Medicaid application needed to restart benefits and upcoming appointment sand need for PCP appointment. 10/31/24 at 1416:  Contact PCP office regarding ongoing BP elevated and BP meds changed at last hospitalization. Spoke with Amera and Christine Triage at [facility] regarding BP concerns and appointment needs for hospital follow up issues due to transportation. 11/06/24 Continue to follow up with Medicaid and [name] to help patient get to appointment with Dr. Gasper on 11/10/24 and patient to have BP cuff calibrated. Follow up for possible ulcer with stomach when eating.  11/14/24 Reactivated  the 30 day TOC program for weekly calls. Reminded patient of [facility] Social Worker appointment to follow up today around 12 NOON, verbalized understanding. 11/20/24 Patient continues to desire weekly follow up, has PCP appointment for 11/24/24 and states he has Medicaid transportation for the appointment.  Patient reminded of BSW follow up appointment as well for 11/23/24 verbalized understanding of information given today. 11/20/24 Continue care plan          Richerd Fish, RN, BSN, CCM  [facility]  [facility] Management Coordinator Direct Dial: [phone]        "

## 2024-11-20 NOTE — Patient Instructions (Signed)
 Visit Information  Thank you for taking time to visit with me today. Please don't hesitate to contact me if I can be of assistance to you before our next scheduled telephone appointment.  Our next appointment is by telephone on 11/28/24 at 1300  Following is a copy of your care plan:   Goals Addressed             This Visit's Progress    VBCI Transitions of Care [facility]) Care Plan       Problems: Readmitted 11/17/2024 to 11/19/2024 with COPD exacerbation with recent Influenza and Atrial Fibrillation: reviewed with patient  Readmitted 11/11/24 through 11/13/24 [facility]) Care plan reactivated 11/14/24 Recent Hospitalization for treatment of COPD; Atrial Fib and Influenza A Knowledge Deficit Related to post hospital care COPD exacerbation, No Hospital Follow Up Provider appointment patient desires to make own appointments, and SDOH barrier: Patient currently declines any needs - 11/14/24 just waiting on DSS to reactivate Medicaid, currently getting rides to appointments 10/31/24 Patient states his transportation needs for appointments that he lost his Medicaid until he gets information back to DSS by 11/06/24 11/06/24 Patient got application sent to DSS today awaiting Medicaid re-enrollment. 11/14/24 Awaiting to hear from DSS, especially regarding restarting tr Goal:  Over the next 30 days, the patient will not experience hospital readmission  Interventions:   COPD Interventions: Advised patient to engage in light exercise as tolerated 3-5 days a week to aid in the the management of COPD Advised patient to track and manage COPD triggers Advised patient to self assesses COPD action plan zone and make appointment with provider if in the yellow zone for 48 hours without improvement Assessed social determinant of health barriers Discussed the importance of adequate rest and management of fatigue with COPD Provided education about and advised patient to utilize infection prevention strategies to reduce  risk of respiratory infection Screening for signs and symptoms of depression related to chronic disease state  Use of home oxygen  prn, states usually at night since CPAP mask is broken 11/14/24 patient states he has not sent mask back but didn't like wearing CPAP as well.  Patient Self Care Activities:  Attend all scheduled provider appointments Call pharmacy for medication refills 3-7 days in advance of running out of medications Call provider office for new concerns or questions  Notify RN Care Manager of [facility] call rescheduling needs Participate in Transition of Care Program/Attend TOC scheduled calls Take medications as prescribed   11/14/24 Reviewed new medications and patient verbalized already started today. [name] continues to assist with medication management as reviewed in AVS. 11/20/24 Patient medications reviewed and acknowledges changes see medication review. Patient encouraged to have BP cuff calibrated as he states EMS each time says his BP cuff continues to read much higher. Patient has been encouraged to have BP cuff calibrated or to obtain a new BP cuff.  Plan:  The patient has been provided with contact information for the care management team and has been advised to call with any health related questions or concerns.   TOC Consent to Disclose Health Information (11/14/24 - patient continues to agree)  Reviewed 11/20/24 and continues to agree to Henrietta as authorized for any follow up Following recent discharge, the patient has enrolled in the Transition of Care [facility]) Program and requested that post-discharge communication be directed to a designated support person. The patient has consented for the [facility] team to share information with the individual(s) named below to support care coordination, medication review, and follow-up planning.  Authorized Individual(s): - Name:  Fahed Morten - Relationship to Patient:  grandson - Contact Info: [phone] Patient verbalized understanding of  the consent and confirmed their request for the [facility] team to communicate with the above-named individual(s) for the duration of the [facility] program enrollment. Patient was informed that this consent may be revoked at any time.   10/23/24 Discussed and offered 30 day TOC program.  Patient agrees to weekly calls in 30 day program.  The patient has been provided with contact information for the care management team and has been advised to call with any health -related questions or concerns.  The patient verbalized understanding with current plan of care.  The patient is directed to their insurance card regarding availability of benefits coverage.  Discussion with patient regarding follow up with counseling and states he's awaiting a call with referral made in November. Offered LCSW follow up and patient agrees, that might help till I get to the counselor.  Denies any immediate needs today. Will make referral. 10/31/24 Referral to BSW regarding transportation needs due to receiving notice of Medicaid application needed to restart benefits and upcoming appointment sand need for PCP appointment. 10/31/24 at 1416:  Contact PCP office regarding ongoing BP elevated and BP meds changed at last hospitalization. Spoke with Amera and Christine Triage at [facility] regarding BP concerns and appointment needs for hospital follow up issues due to transportation. 11/06/24 Continue to follow up with Medicaid and [name] to help patient get to appointment with Dr. Gasper on 11/10/24 and patient to have BP cuff calibrated. Follow up for possible ulcer with stomach when eating.  11/14/24 Reactivated the 30 day TOC program for weekly calls. Reminded patient of VBCI Social Worker appointment to follow up today around 12 NOON, verbalized understanding. 11/20/24 Patient continues to desire weekly follow up, has PCP appointment for 11/24/24 and states he has Medicaid transportation for the appointment.  Patient reminded of BSW  follow up appointment as well for 11/23/24 verbalized understanding of information given today. 11/20/24 Continue care plan         The patient verbalized understanding of instructions, educational materials, and care plan provided today and DECLINED offer to receive copy of patient instructions, educational materials, and care plan.   The patient has been provided with contact information for the care management team and has been advised to call with any health related questions or concerns.   Please call the care guide team at [phone] if you need to cancel or reschedule your appointment.   Please call the [place]  National Suicide Prevention Lifeline: 207 168 0453 or TTY: 731-212-1006 TTY (630)875-7570) to talk to a trained counselor if you are experiencing a Mental Health or Behavioral Health Crisis or need someone to talk to.  Richerd Fish, RN, BSN, CCM [facility], [facility] Management Coordinator Direct Dial: [phone]

## 2024-11-22 ENCOUNTER — Emergency Department
Admission: EM | Admit: 2024-11-22 | Discharge: 2024-11-22 | Disposition: A | Discharge status: Home / Self Care | Attending: Emergency Medicine | Admitting: Emergency Medicine

## 2024-11-22 ENCOUNTER — Encounter: Payer: Self-pay | Admitting: Emergency Medicine

## 2024-11-22 ENCOUNTER — Ambulatory Visit: Payer: Self-pay

## 2024-11-22 ENCOUNTER — Other Ambulatory Visit: Payer: Self-pay

## 2024-11-22 ENCOUNTER — Emergency Department

## 2024-11-22 ENCOUNTER — Telehealth

## 2024-11-22 DIAGNOSIS — J449 Chronic obstructive pulmonary disease, unspecified: Secondary | ICD-10-CM | POA: Diagnosis not present

## 2024-11-22 DIAGNOSIS — R079 Chest pain, unspecified: Secondary | ICD-10-CM | POA: Diagnosis present

## 2024-11-22 LAB — CBC WITH DIFFERENTIAL/PLATELET
Abs Immature Granulocytes: 0.15 K/uL — ABNORMAL HIGH (ref 0.00–0.07)
Basophils Absolute: 0.1 K/uL (ref 0.0–0.1)
Basophils Relative: 1 %
Eosinophils Absolute: 0 K/uL (ref 0.0–0.5)
Eosinophils Relative: 1 %
HCT: 55.2 % — ABNORMAL HIGH (ref 39.0–52.0)
Hemoglobin: 18.1 g/dL — ABNORMAL HIGH (ref 13.0–17.0)
Immature Granulocytes: 2 %
Lymphocytes Relative: 19 %
Lymphs Abs: 1.6 K/uL (ref 0.7–4.0)
MCH: 30.3 pg (ref 26.0–34.0)
MCHC: 32.8 g/dL (ref 30.0–36.0)
MCV: 92.5 fL (ref 80.0–100.0)
Monocytes Absolute: 0.4 K/uL (ref 0.1–1.0)
Monocytes Relative: 5 %
Neutro Abs: 6.2 K/uL (ref 1.7–7.7)
Neutrophils Relative %: 72 %
Platelets: 205 K/uL (ref 150–400)
RBC: 5.97 MIL/uL — ABNORMAL HIGH (ref 4.22–5.81)
RDW: 15.6 % — ABNORMAL HIGH (ref 11.5–15.5)
WBC: 8.5 K/uL (ref 4.0–10.5)
nRBC: 0 % (ref 0.0–0.2)

## 2024-11-22 LAB — BASIC METABOLIC PANEL WITH GFR
Anion gap: 12 (ref 5–15)
BUN: 13 mg/dL (ref 8–23)
CO2: 28 mmol/L (ref 22–32)
Calcium: 9.7 mg/dL (ref 8.9–10.3)
Chloride: 102 mmol/L (ref 98–111)
Creatinine, Ser: 0.83 mg/dL (ref 0.61–1.24)
GFR, Estimated: >60 mL/min
Glucose, Bld: 131 mg/dL — ABNORMAL HIGH (ref 70–99)
Potassium: 3.8 mmol/L (ref 3.5–5.1)
Sodium: 142 mmol/L (ref 135–145)

## 2024-11-22 LAB — TROPONIN T, HIGH SENSITIVITY
Troponin T High Sensitivity: 23 ng/L — ABNORMAL HIGH (ref 0–19)
Troponin T High Sensitivity: 22 ng/L — ABNORMAL HIGH (ref 0–19)

## 2024-11-22 MED ORDER — IPRATROPIUM-ALBUTEROL 0.5-2.5 (3) MG/3ML IN SOLN
3.0000 mL | Freq: Once | RESPIRATORY_TRACT | Status: AC
  Administered 2024-11-22: 3 mL via RESPIRATORY_TRACT
  Filled 2024-11-22: qty 3

## 2024-11-22 NOTE — ED Notes (Signed)
 X1 set of cultures, blue top, grey top sent to lab as save tubes

## 2024-11-22 NOTE — ED Provider Notes (Signed)
 "  [facility] Provider Note    Event Date/Time   First MD Initiated Contact with Patient 11/22/24 1726     (approximate)   History   Chest pain   HPI  Lawrence [name] is a 69 y.o. male who presents to the emergency department today because concerns for chest pain.  States symptoms started today.  Located in the center chest.  He describes it as sharp.  He says that he did try taking 3 nitroglycerin  without any significant relief.  He received aspirin  via EMS.  The patient says that he also noticed that his blood pressure and sugars were elevated today.  Patient was discharged from the hospital 3 days ago after admission for COPD.     Physical Exam   Triage Vital Signs: ED Triage Vitals [11/22/24 1730]  Encounter Vitals Group     BP (!) 157/114     Girls Systolic BP Percentile      Girls Diastolic BP Percentile      Boys Systolic BP Percentile      Boys Diastolic BP Percentile      Pulse      Resp 20     Temp 98 F (36.7 C)     Temp src      SpO2 99 %     Weight 176 lb 5.9 oz (80 kg)     Height      Head Circumference      Peak Flow      Pain Score      Pain Loc      Pain Education      Exclude from Growth Chart     Most recent vital signs: Vitals:   11/22/24 1730  BP: (!) 157/114  Resp: 20  Temp: 98 F (36.7 C)  SpO2: 99%   General: Awake, alert, oriented. CV:  Good peripheral perfusion. Regular rate, irregular rhythm. Resp:  Normal effort. Slight wheezing to lower lungs, worse on the left lung. Abd:  No distention.    ED Results / Procedures / Treatments   Labs (all labs ordered are listed, but only abnormal results are displayed) Labs Reviewed  CBC WITH DIFFERENTIAL/PLATELET - Abnormal; Notable for the following components:      Result Value   RBC 5.97 (*)    Hemoglobin 18.1 (*)    HCT 55.2 (*)    RDW 15.6 (*)    Abs Immature Granulocytes 0.15 (*)    All other components within normal limits  BASIC METABOLIC PANEL WITH  GFR - Abnormal; Notable for the following components:   Glucose, Bld 131 (*)    All other components within normal limits  TROPONIN T, HIGH SENSITIVITY - Abnormal; Notable for the following components:   Troponin T High Sensitivity 23 (*)    All other components within normal limits  TROPONIN T, HIGH SENSITIVITY - Abnormal; Notable for the following components:   Troponin T High Sensitivity 22 (*)    All other components within normal limits     EKG  I, Guadalupe Eagles, attending physician, personally viewed and interpreted this EKG  EKG Time: 1729 Rate: 93 Rhythm: atrial fibrillation Axis: normal Intervals: qtc 387 QRS: narrow ST changes: no st elevation Impression: abnormal ekg    RADIOLOGY I independently interpreted and visualized the CXR. My interpretation: No pneumonia Radiology interpretation:  IMPRESSION:  1. Chronic coarsened interstitial markings without pulmonary edema.  2. No acute findings.      PROCEDURES:  Critical Care  performed: No   MEDICATIONS ORDERED IN ED: Medications - No data to display   IMPRESSION / MDM / ASSESSMENT AND PLAN / ED COURSE  I reviewed the triage vital signs and the nursing notes.                              Differential diagnosis includes, but is not limited to, ACS, chronic pain, costochondritis, pneumonia  Patient's presentation is most consistent with acute presentation with potential threat to life or bodily function.   Patient presented to the emergency department today with primary concern for chest pain.  EKG without concerning ST elevation.  Initial troponin was slightly elevated, repeat was stable.  Per chart review does appear that this is roughly patient's baseline.  It does appear that patient has issues with chronic pain.  At this time I do think likely the patient's pain is his chronic pain.  Given reassuring workup.  Think is reasonable for patient to be discharged.  Did encourage patient to follow-up with  his cardiologist.     FINAL CLINICAL IMPRESSION(S) / ED DIAGNOSES   Final diagnoses:  Nonspecific chest pain         Note:  This document was prepared using Dragon voice recognition software and may include unintentional dictation errors.    Floy Roberts, MD 11/22/24 2119  "

## 2024-11-22 NOTE — Telephone Encounter (Signed)
 FYI Only or Action Required?: FYI only for provider: ED advised.  Patient was last seen in primary care on 08/11/2024 by [name], MD.  Called Nurse Triage reporting Dizziness and Hypertension.  Symptoms began today.  Interventions attempted: Nothing.  Symptoms are: gradually worsening.  Triage Disposition: Call EMS 911 Now  Patient/caregiver understands and will follow disposition?: Yes             Copied from CRM [id number]. Topic: Clinical - Red Word Triage >> Nov 22, 2024  3:39 PM Lawrence [name] wrote: Red Word that prompted transfer to Nurse Triage: Pt bp is 171/115 and heart rate 109 and feels lite headed. Reason for Disposition  [1] Chest pain lasts > 5 minutes AND [2] history of heart disease (i.e., heart attack, bypass surgery, angina, angioplasty, CHF)  Answer Assessment - Initial Assessment Questions Patient states symptoms started this morning with heart racing, dizzy/lightheaded and checked his vitals this afternoon and BP 171/115, HR 109. He states he has had chest pain for a couple of hours, worsening SOB. Worsening right leg weakness x 3 days. He states he was discharged from the hospital on Sunday. Patient states his grandson is his caregiver and is with him now and can call rescue for him.  Protocols used: Blood Pressure - High-A-AH

## 2024-11-23 ENCOUNTER — Other Ambulatory Visit: Payer: Self-pay

## 2024-11-23 NOTE — Patient Instructions (Signed)
 Visit Information  Thank you for taking time to visit with me today. Please don't hesitate to contact me if I can be of assistance to you before our next scheduled appointment.  Your next care management appointment is by telephone on 12/08/24 at 1145    Please call the care guide team at [phone] if you need to cancel, schedule, or reschedule an appointment.   Please call the Suicide and Crisis Lifeline: 988 call the [place]  National Suicide Prevention Lifeline: (845)337-6746 or TTY: (660)456-5442 TTY 361-805-4429) to talk to a trained counselor call 1-800-273-TALK (toll free, 24 hour hotline) call 911 if you are experiencing a Mental Health or Behavioral Health Crisis or need someone to talk to.  Thersia Hoar, HEDWIG, MHA [place]  [facility] Social Worker, Population Health [phone]

## 2024-11-23 NOTE — Patient Outreach (Signed)
 Social Drivers of Health  Community Resource and Care Coordination Visit Note   11/23/2024  Name: LEEVI CULLARS MRN: [id number] DOB:09/13/1956  Situation: Referral received for [facility] needs assessment and assistance related to Transportation. I obtained verbal consent from Patient.  Visit completed with Patient on the phone.   Background:      Assessment:   Goals Addressed             This Visit's Progress    BSW Goal       Current SDOH Barriers:  Transportation  Interventions: Provided patient with information about [employer]. SW contacted [employer] [phone] to inquire if they covered [facility]. [name] will contact SW back to inform if they cover [place] or just [employer]. Call received from Kevin and they do cover [facility]. Patient is waiting for a decision form DSS on Medicaid application. Patient has Family planning Medicaid. SW emailed [employer] information to patients son.  Thersia Hoar, HEDWIG, MHA [place]  [facility] Social Worker, Population Health [phone]           Recommendation:   call for transportation assistance at least one week before appointments  Follow Up Plan:   Telephone follow-up 12/08/24 at 11:45  Thersia Hoar, BSW, [facility] [place]  [facility] Social Worker, Population Health [phone]

## 2024-11-24 ENCOUNTER — Inpatient Hospital Stay: Admitting: Family Medicine

## 2024-11-25 ENCOUNTER — Emergency Department: Admission: EM | Admit: 2024-11-25 | Discharge: 2024-11-26 | Disposition: A

## 2024-11-25 ENCOUNTER — Emergency Department

## 2024-11-25 ENCOUNTER — Other Ambulatory Visit: Payer: Self-pay

## 2024-11-25 ENCOUNTER — Encounter: Payer: Self-pay | Admitting: Emergency Medicine

## 2024-11-25 DIAGNOSIS — S0990XA Unspecified injury of head, initial encounter: Secondary | ICD-10-CM | POA: Insufficient documentation

## 2024-11-25 DIAGNOSIS — I2510 Atherosclerotic heart disease of native coronary artery without angina pectoris: Secondary | ICD-10-CM | POA: Insufficient documentation

## 2024-11-25 DIAGNOSIS — R7989 Other specified abnormal findings of blood chemistry: Secondary | ICD-10-CM | POA: Diagnosis not present

## 2024-11-25 DIAGNOSIS — E119 Type 2 diabetes mellitus without complications: Secondary | ICD-10-CM | POA: Diagnosis not present

## 2024-11-25 DIAGNOSIS — Z951 Presence of aortocoronary bypass graft: Secondary | ICD-10-CM | POA: Diagnosis not present

## 2024-11-25 DIAGNOSIS — Y92009 Unspecified place in unspecified non-institutional (private) residence as the place of occurrence of the external cause: Secondary | ICD-10-CM | POA: Insufficient documentation

## 2024-11-25 DIAGNOSIS — I4891 Unspecified atrial fibrillation: Secondary | ICD-10-CM | POA: Insufficient documentation

## 2024-11-25 DIAGNOSIS — R079 Chest pain, unspecified: Secondary | ICD-10-CM | POA: Insufficient documentation

## 2024-11-25 DIAGNOSIS — I5020 Unspecified systolic (congestive) heart failure: Secondary | ICD-10-CM | POA: Diagnosis not present

## 2024-11-25 DIAGNOSIS — G8929 Other chronic pain: Secondary | ICD-10-CM | POA: Insufficient documentation

## 2024-11-25 DIAGNOSIS — Z7901 Long term (current) use of anticoagulants: Secondary | ICD-10-CM | POA: Insufficient documentation

## 2024-11-25 DIAGNOSIS — J449 Chronic obstructive pulmonary disease, unspecified: Secondary | ICD-10-CM | POA: Diagnosis not present

## 2024-11-25 DIAGNOSIS — W01198A Fall on same level from slipping, tripping and stumbling with subsequent striking against other object, initial encounter: Secondary | ICD-10-CM | POA: Diagnosis not present

## 2024-11-25 DIAGNOSIS — J9611 Chronic respiratory failure with hypoxia: Secondary | ICD-10-CM

## 2024-11-25 DIAGNOSIS — I110 Hypertensive heart disease with heart failure: Secondary | ICD-10-CM | POA: Diagnosis not present

## 2024-11-25 LAB — CBG MONITORING, ED: Glucose-Capillary: 96 mg/dL (ref 70–99)

## 2024-11-25 MED ORDER — METHOCARBAMOL 500 MG PO TABS: 500.0000 mg | ORAL_TABLET | Freq: Three times a day (TID) | ORAL | Status: DC | PRN

## 2024-11-25 MED ORDER — ACETAMINOPHEN 325 MG PO TABS: 325.0000 mg | ORAL_TABLET | ORAL | Status: DC | PRN

## 2024-11-25 MED ORDER — LIDOCAINE 5 % EX PTCH
1.0000 | MEDICATED_PATCH | CUTANEOUS | Status: DC
  Administered 2024-11-25: 1 via TRANSDERMAL
  Filled 2024-11-25: qty 1

## 2024-11-25 MED ORDER — FAMOTIDINE 20 MG PO TABS
20.0000 mg | ORAL_TABLET | Freq: Two times a day (BID) | ORAL | Status: DC
  Administered 2024-11-26: 20 mg via ORAL
  Filled 2024-11-25: qty 1

## 2024-11-25 MED ORDER — APIXABAN 5 MG PO TABS
5.0000 mg | ORAL_TABLET | Freq: Two times a day (BID) | ORAL | Status: DC
  Administered 2024-11-26: 5 mg via ORAL
  Filled 2024-11-25: qty 1

## 2024-11-25 MED ORDER — BUDESON-GLYCOPYRROL-FORMOTEROL 160-9-4.8 MCG/ACT IN AERO
2.0000 | INHALATION_SPRAY | Freq: Two times a day (BID) | RESPIRATORY_TRACT | Status: DC
  Filled 2024-11-25: qty 5.9

## 2024-11-25 MED ORDER — POLYSACCHARIDE IRON COMPLEX 150 MG PO CAPS
150.0000 mg | ORAL_CAPSULE | Freq: Every day | ORAL | Status: DC
  Administered 2024-11-26: 150 mg via ORAL
  Filled 2024-11-25: qty 1

## 2024-11-25 MED ORDER — TRAZODONE HCL 50 MG PO TABS: 150.0000 mg | ORAL_TABLET | Freq: Every evening | ORAL | Status: DC | PRN

## 2024-11-25 MED ORDER — METOPROLOL SUCCINATE ER 50 MG PO TB24
50.0000 mg | ORAL_TABLET | Freq: Every day | ORAL | Status: DC
  Administered 2024-11-26: 50 mg via ORAL
  Filled 2024-11-25: qty 1

## 2024-11-25 MED ORDER — VITAMIN D 25 MCG (1000 UNIT) PO TABS
1000.0000 [IU] | ORAL_TABLET | Freq: Every day | ORAL | Status: DC
  Administered 2024-11-26: 1000 [IU] via ORAL
  Filled 2024-11-25: qty 1

## 2024-11-25 MED ORDER — ALLOPURINOL 300 MG PO TABS
300.0000 mg | ORAL_TABLET | Freq: Two times a day (BID) | ORAL | Status: DC
  Administered 2024-11-26: 300 mg via ORAL
  Filled 2024-11-25 (×3): qty 1

## 2024-11-25 MED ORDER — INSULIN ASPART 100 UNIT/ML IJ SOLN
0.0000 [IU] | Freq: Three times a day (TID) | INTRAMUSCULAR | Status: DC
  Administered 2024-11-26: 1 [IU] via SUBCUTANEOUS
  Administered 2024-11-26: 2 [IU] via SUBCUTANEOUS
  Filled 2024-11-25: qty 1
  Filled 2024-11-25: qty 2

## 2024-11-25 MED ORDER — ALBUTEROL SULFATE (2.5 MG/3ML) 0.083% IN NEBU: 2.5000 mg | INHALATION_SOLUTION | Freq: Four times a day (QID) | RESPIRATORY_TRACT | Status: DC | PRN

## 2024-11-25 MED ORDER — OXYCODONE-ACETAMINOPHEN 10-325 MG PO TABS: 1.0000 | ORAL_TABLET | ORAL | Status: DC | PRN

## 2024-11-25 MED ORDER — SACUBITRIL-VALSARTAN 49-51 MG PO TABS
1.0000 | ORAL_TABLET | Freq: Two times a day (BID) | ORAL | Status: DC
  Administered 2024-11-26: 1 via ORAL
  Filled 2024-11-25 (×3): qty 1

## 2024-11-25 MED ORDER — SPIRONOLACTONE 25 MG PO TABS
25.0000 mg | ORAL_TABLET | Freq: Two times a day (BID) | ORAL | Status: DC
  Administered 2024-11-26: 25 mg via ORAL
  Filled 2024-11-25: qty 1

## 2024-11-25 MED ORDER — ATORVASTATIN CALCIUM 20 MG PO TABS: 80.0000 mg | ORAL_TABLET | Freq: Every day | ORAL | Status: DC

## 2024-11-25 MED ORDER — VITAMIN B-12 1000 MCG PO TABS
1000.0000 ug | ORAL_TABLET | Freq: Every day | ORAL | Status: DC
  Administered 2024-11-26: 1000 ug via ORAL
  Filled 2024-11-25: qty 1

## 2024-11-25 MED ORDER — ACETAMINOPHEN 325 MG PO TABS: 650.0000 mg | ORAL_TABLET | Freq: Four times a day (QID) | ORAL | Status: DC | PRN

## 2024-11-25 MED ORDER — PANTOPRAZOLE SODIUM 40 MG PO TBEC
40.0000 mg | DELAYED_RELEASE_TABLET | Freq: Two times a day (BID) | ORAL | Status: DC
  Administered 2024-11-26 (×2): 40 mg via ORAL
  Filled 2024-11-25 (×2): qty 1

## 2024-11-25 MED ORDER — LORATADINE 10 MG PO TABS
10.0000 mg | ORAL_TABLET | Freq: Every day | ORAL | Status: DC
  Administered 2024-11-26: 10 mg via ORAL
  Filled 2024-11-25: qty 1

## 2024-11-25 MED ORDER — DIAZEPAM 5 MG/ML IJ SOLN: 2.5000 mg | Freq: Once | INTRAMUSCULAR | Status: DC

## 2024-11-25 MED ORDER — ALPRAZOLAM 0.5 MG PO TABS
1.0000 mg | ORAL_TABLET | Freq: Three times a day (TID) | ORAL | Status: DC | PRN
  Administered 2024-11-26: 1 mg via ORAL
  Filled 2024-11-25: qty 2

## 2024-11-25 MED ORDER — OXYCODONE HCL 5 MG PO TABS
10.0000 mg | ORAL_TABLET | ORAL | Status: DC | PRN
  Administered 2024-11-26 (×2): 10 mg via ORAL
  Filled 2024-11-25 (×2): qty 2

## 2024-11-25 MED ORDER — INSULIN ASPART 100 UNIT/ML IJ SOLN: 0.0000 [IU] | Freq: Every day | INTRAMUSCULAR | Status: DC

## 2024-11-25 MED ORDER — METFORMIN HCL ER 500 MG PO TB24
500.0000 mg | ORAL_TABLET | Freq: Every day | ORAL | Status: DC
  Administered 2024-11-26: 500 mg via ORAL
  Filled 2024-11-25: qty 1

## 2024-11-25 NOTE — ED Provider Notes (Signed)
 "  [facility] Provider Note    Event Date/Time   First MD Initiated Contact with Patient 11/25/24 2102     (approximate)   History   Fall on Eliquis  and Head Injury   HPI  Lawrence [name] is a 69 y.o. male  68 y.o. male with a PMH significant for CAD (s/p of stent x 4/CABG 2016), A-fib on Eliquis , HFrEF (EF 35 to 40%, 07/2024), PSVT/frequent PVCs, HTN,DM, COPD on 2L O2, OSA not using CPAP.        Physical Exam   Triage Vital Signs: ED Triage Vitals  Encounter Vitals Group     BP 11/25/24 2014 110/86     Girls Systolic BP Percentile --      Girls Diastolic BP Percentile --      Boys Systolic BP Percentile --      Boys Diastolic BP Percentile --      Pulse Rate 11/25/24 2014 88     Resp 11/25/24 2014 20     Temp 11/25/24 2014 98.1 F (36.7 C)     Temp Source 11/25/24 2014 Oral     SpO2 11/25/24 2014 97 %     Weight 11/25/24 2017 176 lb 5.9 oz (80 kg)     Height --      Head Circumference --      Peak Flow --      Pain Score 11/25/24 2016 9     Pain Loc --      Pain Education --      Exclude from Growth Chart --     Most recent vital signs: Vitals:   11/25/24 2014  BP: 110/86  Pulse: 88  Resp: 20  Temp: 98.1 F (36.7 C)  SpO2: 97%    Nursing Triage Note reviewed. Vital signs reviewed and patients oxygen  saturation is normoxic***  General: Patient is well nourished, well developed, awake and alert, resting comfortably in no acute distress Head: Normocephalic and atraumatic Eyes: Normal inspection, extraocular muscles intact, no conjunctival pallor Ear, nose, throat: Normal external exam Neck: Normal range of motion Respiratory: Patient is in no respiratory distress, lungs CTAB Cardiovascular: Patient is not tachycardic, RRR without murmur appreciated GI: Abd SNT with no guarding or rebound  Back: Normal inspection of the back with good strength and range of motion throughout all ext Extremities: pulses intact with good cap refills,  no LE pitting edema or calf tenderness Neuro: The patient is alert and oriented to person, place, and time, appropriately conversive, with 5/5 bilat UE/LE strength, no gross motor or sensory defects noted. Coordination appears to be adequate. Skin: Warm, dry, and intact Psych: normal mood and affect, no SI or HI  ED Results / Procedures / Treatments   Labs (all labs ordered are listed, but only abnormal results are displayed) Labs Reviewed - No data to display   EKG   RADIOLOGY ***    PROCEDURES:  Critical Care performed: {CriticalCareYesNo:19197::Yes, see critical care procedure note(s),No}  Procedures   MEDICATIONS ORDERED IN ED: Medications - No data to display   IMPRESSION / MDM / ASSESSMENT AND PLAN / ED COURSE                                Differential diagnosis includes, but is not limited to, ***    ***   Clinical Course as of 11/25/24 2156  Sat Nov 25, 2024  2120 Will ambulate the  patient around the unit and determine whether patient can stay or go [HD]  2122 He was seen by PT OT on 11/18/2024 and is supposed to be using his rolling walker and be on oxygen  which she is not using either [HD]  2151 Chart review states that patient has home health services [HD]    Clinical Course User Index [HD] Nicholaus Rolland BRAVO, MD   -- Risk: 5 This patient has a high risk of morbidity due to further diagnostic testing or treatment. Rationale: This patients evaluation and management involve a high risk of morbidity due to the potential severity of presenting symptoms, need for diagnostic testing, and/or initiation of treatment that may require close monitoring. The differential includes conditions with potential for significant deterioration or requiring escalation of care. Treatment decisions in the ED, including medication administration, procedural interventions, or disposition planning, reflect this level of risk. COPA: 5 The patient has the following acute or  chronic illness/injury that poses a possible threat to life or bodily function: [X] : The patient has a potentially serious acute condition or an acute exacerbation of a chronic illness requiring urgent evaluation and management in the Emergency Department. The clinical presentation necessitates immediate consideration of life-threatening or function-threatening diagnoses, even if they are ultimately ruled out.   FINAL CLINICAL IMPRESSION(S) / ED DIAGNOSES   Final diagnoses:  None     Rx / DC Orders   ED Discharge Orders     None        Note:  This document was prepared using Dragon voice recognition software and may include unintentional dictation errors. "

## 2024-11-25 NOTE — ED Triage Notes (Signed)
 Pt in [employer] independent living via [place] after trip fall tonight. Pt thinks he tripped over some bags on the floor, struck forehead vs wall then floor, landed on his back - reports low midline back pain now. Takes Eliquis , denies LOC.  EMS states pt had caregiver up until recently, assumes pt will need more assistance at home or perhaps placement.  VS w/EMS 131/72 100HR 18RR 97%

## 2024-11-25 NOTE — Discharge Instructions (Signed)
 RETURN PRECAUTIONS & AFTERCARE: (ENGLISH) RETURN PRECAUTIONS: Return immediately to the emergency department or see/call your doctor if you feel worse, weak or have changes in speech or vision, are short of breath, have fever, vomiting, pain, bleeding or dark stool, trouble urinating or any new issues. Return here or see/call your doctor if not improving as expected for your suspected condition. FOLLOW-UP CARE: Call your doctor and/or any doctors we referred you to for more advice and to make an appointment. Do this today, tomorrow or after the weekend. Some doctors only take PPO insurance so if you have HMO insurance you may want to contact your HMO or your regular doctor for referral to a specialist within your plan. Either way tell the doctor's office that it was a referral from the emergency department so you get the soonest possible appointment.  YOUR TEST RESULTS: Take result reports of any blood or urine tests, imaging tests and EKG's to your doctor and any referral doctor. Have any abnormal tests repeated. Your doctor or a referral doctor can let you know when this should be done. Also make sure your doctor contacts this hospital to get any test results that are not currently available such as cultures or special tests for infection and final imaging reports, which are often not available at the time you leave the ER but which may list additional important findings that are not documented on the preliminary report. BLOOD PRESSURE: If your blood pressure was greater than 120/80 have your blood pressure rechecked within 1 to 2 weeks. MEDICATION SIDE EFFECTS: Do not drive, walk, bike, take the bus, etc. if you have received or are being prescribed any sedating medications such as those for pain or anxiety or certain antihistamines like Benadryl . If you have been give one of these here get a taxi home or have a friend drive you home. Ask your pharmacist to counsel you on potential side effects of any new  medication

## 2024-11-25 NOTE — ED Notes (Signed)
 This RN flagged down by MD Nicholaus and advised pt wears 2 liters chronic at home and hasn't been wearing since he was placed in [facility]. I assessed vitals. He is 96% on RA with HR of 88. Pt in no resp distress. Pt lives in an apartment.

## 2024-11-26 ENCOUNTER — Emergency Department

## 2024-11-26 LAB — TROPONIN T, HIGH SENSITIVITY: Troponin T High Sensitivity: 23 ng/L — ABNORMAL HIGH (ref 0–19)

## 2024-11-26 LAB — CBG MONITORING, ED
Glucose-Capillary: 118 mg/dL — ABNORMAL HIGH (ref 70–99)
Glucose-Capillary: 131 mg/dL — ABNORMAL HIGH (ref 70–99)
Glucose-Capillary: 151 mg/dL — ABNORMAL HIGH (ref 70–99)

## 2024-11-26 NOTE — ED Notes (Signed)
 Patient continuing to have chest pain at this time rating it a 9/10. Requesting a docter. EDP Tan was notified.

## 2024-11-26 NOTE — ED Provider Notes (Signed)
 .----------------------------------------- 11:46 AM on 11/26/2024 -----------------------------------------  Blood pressure 135/87, pulse 79, temperature 98.1 F (36.7 C), temperature source Oral, resp. rate 18, weight 80 kg, SpO2 100%.  Patient is a TOC border.  Pending placement.  Complain about chest pain today.   Was getting oxycodone  for pain.  On independent chart review, was seen on 7 January for chest pain and cleared, he was also admitted prior to that for chest pain and shortness of breath thought secondary to COPD exacerbation.  Does have history of chronic chest pain.  Also has history of CAD. Will add on EKG, troponin and chest x-ray.   EKG shows atrial fibrillation, rate 89, normal QS, normal QTc, no obvious ischemic ST elevation, T wave flattening to inferior leads, not significantly changed compared to prior.  Chest x-ray and troponin are reassuring, no acute changes.  On assessment he is not complaining about chest pain at this time.  Did tell him that he has his pain medications scheduled and he understands that.  Will continue to monitor pending social work disposition.   Clinical Course as of 11/26/24 1312  Sat Nov 25, 2024  2120 Will ambulate the patient around the unit and determine whether patient can stay or go [HD]  2122 He was seen by PT OT on 11/18/2024 and is supposed to be using his rolling walker and be on oxygen  which she is not using either [HD]  2151 Chart review states that patient has home health services [HD]  Sun Nov 26, 2024  1235 Troponin T High Sensitivity(!): 23 Mildly elevated, on independent chart review, his troponins are chronically mildly elevated. [TT]    Clinical Course User Index [HD] Nicholaus Rolland BRAVO, MD [TT] [name], MD     Medications  lidocaine  (LIDODERM ) 5 % 1 patch (1 patch Transdermal Patch Removed 11/26/24 0947)  acetaminophen  (TYLENOL ) tablet 650 mg (has no administration in time range)  albuterol  (PROVENTIL ) (2.5 MG/3ML)  0.083% nebulizer solution 2.5 mg (has no administration in time range)  allopurinol  (ZYLOPRIM ) tablet 300 mg (300 mg Oral Given 11/26/24 0943)  ALPRAZolam  (XANAX ) tablet 1 mg (has no administration in time range)  apixaban  (ELIQUIS ) tablet 5 mg (5 mg Oral Given 11/26/24 0942)  atorvastatin  (LIPITOR ) tablet 80 mg (80 mg Oral Not Given 11/25/24 2307)  budesonide -glycopyrrolate -formoterol  (BREZTRI ) 160-9-4.8 MCG/ACT inhaler 2 puff (0 puffs Inhalation Hold 11/26/24 0815)  loratadine  (CLARITIN ) tablet 10 mg (10 mg Oral Given 11/26/24 0942)  cholecalciferol  (VITAMIN D3) 25 MCG (1000 UNIT) tablet 1,000 Units (1,000 Units Oral Given 11/26/24 0942)  cyanocobalamin  (VITAMIN B12) tablet 1,000 mcg (1,000 mcg Oral Given 11/26/24 0943)  famotidine  (PEPCID ) tablet 20 mg (20 mg Oral Given 11/26/24 0942)  iron  polysaccharides (NIFEREX) capsule 150 mg (150 mg Oral Given 11/26/24 0943)  metFORMIN  (GLUCOPHAGE -XR) 24 hr tablet 500 mg (500 mg Oral Given 11/26/24 0815)  metoprolol  succinate (TOPROL -XL) 24 hr tablet 50 mg (50 mg Oral Given 11/26/24 0942)  methocarbamol  (ROBAXIN ) tablet 500 mg (has no administration in time range)  pantoprazole  (PROTONIX ) EC tablet 40 mg (40 mg Oral Given 11/26/24 0814)  spironolactone  (ALDACTONE ) tablet 25 mg (25 mg Oral Given 11/26/24 0814)  traZODone  (DESYREL ) tablet 150 mg (has no administration in time range)  sacubitril -valsartan  (ENTRESTO ) 49-51 mg per tablet (1 tablet Oral Given 11/26/24 0943)  insulin  aspart (novoLOG ) injection 0-9 Units (1 Units Subcutaneous Given 11/26/24 1236)  insulin  aspart (novoLOG ) injection 0-5 Units ( Subcutaneous Not Given 11/25/24 2332)  oxyCODONE  (Oxy IR/ROXICODONE ) immediate release tablet 10  mg (10 mg Oral Given 11/26/24 0814)    And  acetaminophen  (TYLENOL ) tablet 325 mg (has no administration in time range)     ED Discharge Orders     None      Final diagnoses:  Injury of head, initial encounter  Anticoagulated  Chronic chest pain      [name], MD 11/26/24 1312

## 2024-11-26 NOTE — ED Provider Notes (Signed)
 Procedures  Clinical Course as of 11/26/24 1711  Sat Nov 25, 2024  2120 Will ambulate the patient around the unit and determine whether patient can stay or go [HD]  2122 He was seen by PT OT on 11/18/2024 and is supposed to be using his rolling walker and be on oxygen  which she is not using either [HD]  2151 Chart review states that patient has home health services [HD]  Sun Nov 26, 2024  1235 Troponin T High Sensitivity(!): 23 Mildly elevated, on independent chart review, his troponins are chronically mildly elevated. [TT]    Clinical Course User Index [HD] Nicholaus Rolland BRAVO, MD [TT] [name], MD    ----------------------------------------- 5:11 PM on 11/26/2024 ----------------------------------------- Discussed case with social work, reviewed chart including prior admission and social work notes and physical therapy evaluation.  Does not meet SNF criteria.  Fell at home while not using his oxygen  or walker.  Has weekly follow-up calls in place from social work currently.  Had a PCP appointment 2 days ago.  No apparent medical emergency with his chronic ambulatory dysfunction and chronic hypoxic respiratory failure, and is no identifiable benefit to further retaining him in the ED or admitting to the hospital.  Will plan for discharge.     [name], MD 11/26/24 [phone]

## 2024-11-26 NOTE — Evaluation (Signed)
 Physical Therapy Evaluation Patient Details Name: Lawrence [name] MRN: [id number] DOB: 1956-10-27 Today's Date: 11/26/2024  History of Present Illness  Patient is a 69 year old male with chest pain , COPD exacerbation. Recent Flu. PMH of CAD (s/p of stent x 4/CABG 2016), A-fib on Eliquis , HFrEF, PSVT/frequent PVCs, HTN,DM, COPD on 2L O2, OSA not using CPAP, multiple prior hospitalizations related to chest pain/CHF/COPD  Clinical Impression  Patient noted to be in supine position at PT arrival in room, for an initial PT evaluation due to a decline in functional status, with baseline mobility reported as modI with occasional assistance from grandson who is no longer able to provide assistance, and currently requiring min/CGA for hallway ambulation 40' feet with RW. The patient is A&O x 4, presenting with good willingness to work with PT. The patient resides in a apartment and lives alone with limited family/friend support. There are no STE inside the residence.  Vitals are stable with an SpO? of >90% on 2L/min. Normal HR response to mobility although pt. Reports having 9/10 pain in lower back. Gait was assessed with RW. Gait mechanic observations noted shuffled and flexed posture. The overall clinical impression is that the patient presents with mild/moderate mobility limitations. Recommended skilled PT will address safety, mobility, and discharge planning.        If plan is discharge home, recommend the following: A little help with walking and/or transfers;A little help with bathing/dressing/bathroom;Assistance with cooking/housework;Help with stairs or ramp for entrance;Assist for transportation   Can travel by private vehicle        Equipment Recommendations None recommended by PT  Recommendations for Other Services       Functional Status Assessment Patient has had a recent decline in their functional status and demonstrates the ability to make significant improvements in function in a  reasonable and predictable amount of time.     Precautions / Restrictions Precautions Precautions: Fall Recall of Precautions/Restrictions: Intact Restrictions Weight Bearing Restrictions Per Provider Order: No      Mobility  Bed Mobility Overal bed mobility: Modified Independent Bed Mobility: Sit to Supine, Supine to Sit     Supine to sit: Supervision Sit to supine: Supervision   General bed mobility comments: increased time    Transfers Overall transfer level: Needs assistance Equipment used: Rolling walker (2 wheels) Transfers: Sit to/from Stand Sit to Stand: Supervision           General transfer comment: slow but steady with rolling walker    Ambulation/Gait Ambulation/Gait assistance: Contact guard assist, Min assist Gait Distance (Feet): 40 Feet Assistive device: Rolling walker (2 wheels) Gait Pattern/deviations: Step-through pattern Gait velocity: decreased     General Gait Details: limited by pain and pt had multuple reports of chest pain thorughout; pt stopped several times throughout with  Stairs            Wheelchair Mobility     Tilt Bed    Modified Rankin (Stroke Patients Only)       Balance Overall balance assessment: Needs assistance Sitting-balance support: No upper extremity supported, Feet unsupported Sitting balance-Leahy Scale: Good     Standing balance support: Bilateral upper extremity supported Standing balance-Leahy Scale: Fair                               Pertinent Vitals/Pain Pain Assessment Faces Pain Scale: Hurts a little bit Pain Location: chest tightness Pain Descriptors / Indicators: Discomfort, Tightness Pain  Intervention(s): Limited activity within patient's tolerance, Monitored during session, Repositioned    Home Living Family/patient expects to be discharged to:: Private residence Living Arrangements: Other relatives Available Help at Discharge: Family;Available 24 hours/day Type of  Home: Apartment Home Access: Level entry       Home Layout: One level Home Equipment: Shower seat - built in;Rollator (4 wheels);BSC/3in1;Hospital bed;Grab bars - toilet;Grab bars - tub/shower      Prior Function Prior Level of Function : Independent/Modified Independent;History of Falls (last six months)             Mobility Comments: Modified independent ambulating with rollator.  2 L O2 at night (PRN during the day) ADLs Comments: Grandson no longer assists with IADL's, groceries are delivered; uses transportation services to MD appts     Extremity/Trunk Assessment   Upper Extremity Assessment Upper Extremity Assessment: Defer to OT evaluation    Lower Extremity Assessment Lower Extremity Assessment: Generalized weakness    Cervical / Trunk Assessment Cervical / Trunk Assessment: Kyphotic  Communication   Communication Communication: No apparent difficulties    Cognition Arousal: Alert Behavior During Therapy: WFL for tasks assessed/performed   PT - Cognitive impairments: No apparent impairments                         Following commands: Intact       Cueing Cueing Techniques: Verbal cues     General Comments General comments (skin integrity, edema, etc.): pt feels generally fatigue and very weak compared to his normal    Exercises     Assessment/Plan    PT Assessment Patient needs continued PT services  PT Problem List Decreased strength;Decreased range of motion;Decreased activity tolerance;Decreased balance;Decreased mobility;Cardiopulmonary status limiting activity       PT Treatment Interventions DME instruction;Gait training;Stair training;Functional mobility training;Therapeutic activities;Therapeutic exercise;Balance training;Neuromuscular re-education;Patient/family education    PT Goals (Current goals can be found in the Care Plan section)       Frequency Min 1X/week     Co-evaluation PT/OT/SLP Co-Evaluation/Treatment:  Yes Reason for Co-Treatment: To address functional/ADL transfers PT goals addressed during session: Mobility/safety with mobility OT goals addressed during session: ADL's and self-care       AM-PAC PT 6 Clicks Mobility  Outcome Measure Help needed turning from your back to your side while in a flat bed without using bedrails?: None Help needed moving from lying on your back to sitting on the side of a flat bed without using bedrails?: None Help needed moving to and from a bed to a chair (including a wheelchair)?: A Little Help needed standing up from a chair using your arms (e.g., wheelchair or bedside chair)?: A Little Help needed to walk in hospital room?: A Little Help needed climbing 3-5 steps with a railing? : A Little 6 Click Score: 20    End of Session Equipment Utilized During Treatment: Oxygen  Activity Tolerance: Patient tolerated treatment well Patient left: in bed;with call bell/phone within reach Nurse Communication: Mobility status PT Visit Diagnosis: Muscle weakness (generalized) (M62.81);Unsteadiness on feet (R26.81)    Time: 0950-1003 PT Time Calculation (min) (ACUTE ONLY): 13 min   Charges:   PT Evaluation $PT Eval Low Complexity: 1 Low   PT General Charges $$ ACUTE PT VISIT: 1 Visit         Sherlean Lesches DPT, PT    Kishana Battey A Kirin Pastorino 11/26/2024, 10:20 AM

## 2024-11-26 NOTE — ED Notes (Signed)
 RN threw away breakfast, Pt stated they were not hungry at this time.

## 2024-11-26 NOTE — TOC Initial Note (Signed)
 Transition of Care [facility]) - Initial/Assessment Note    Patient Details  Name: Lawrence [name] MRN: [id number] Date of Birth: [birth year]  Transition of Care [facility]) CM/SW Contact:    Aanchal Cope L Fortunato Nordin, LCSW Phone Number: 11/26/2024, 5:25 PM  Clinical Narrative:                  CSW met with patient. No family at bedside. CSW explained role. CSW advised that after chart review, patient does not meet the criteria for SNF. CSW explained that patient does not meet the medical criteria. CSW offered patient choice for home health. He advised that he had it before and they never came out. He couldn't recall the name of the agency. He stated that he will take whoever accepts [facility]. CSW mentioned [facility] in addition to other agencies. He advised that he will try [facility].   [facility] selected in the portal. CSW spoke with the rep, Darleene to confirm service availability for PT/OT/RN and AIDE.   DME discussed. Patient advised that he has a rollator at home. CSW advised that most times a rollator isn't safe because people forget to use the brakes and they end up falling. He agreed. CSW discussed compliance with patient. Patient advised that he needs to follow recommendations provided.   Patient advised that his grandson will provide transportation home.        Patient Goals and CMS Choice            Expected Discharge Plan and Services                                              Prior Living Arrangements/Services                       Activities of Daily Living      Permission Sought/Granted                  Emotional Assessment              Admission diagnosis:  Fall Patient Active Problem List   Diagnosis Date Noted   Atrial fibrillation with rapid ventricular response (HCC) 11/12/2024   Influenza A 11/11/2024   Afib (HCC) 11/11/2024   COPD exacerbation (HCC) 10/17/2024   Anxiety and depression 10/17/2024   Chronic atrial fibrillation with  RVR (HCC) 10/17/2024   Coronary artery disease 10/17/2024   Hypocalcemia 10/06/2024   Protein-calorie malnutrition, severe 10/06/2024   Substernal precordial chest pain 10/05/2024   Right upper quadrant abdominal pain 08/14/2024   Elevated hemoglobin 08/14/2024   COPD (chronic obstructive pulmonary disease) (HCC) 06/10/2024   Bipolar disorder (HCC) 06/10/2024   Mild cognitive impairment 06/09/2024   Atrial fibrillation with RVR (HCC) 05/30/2024   Hypotension 10/18/2023   COPD with acute exacerbation (HCC) 10/18/2023   Generalized weakness 10/18/2023   Left hip pain 10/13/2023   Malfunction of continuous or biphasic positive airway pressure machine 10/13/2023   Overweight (BMI 25.0-29.9) 10/02/2023   Chest pain 10/01/2023   Near syncope 09/29/2023   Dyslipidemia 09/29/2023   Anxiety 09/29/2023   Atrial fibrillation, chronic (HCC) 09/29/2023   NSTEMI (non-ST elevated myocardial infarction) (HCC) 08/04/2023   CAD S/P percutaneous coronary angioplasty 08/04/2023   PAD (peripheral artery disease) 07/11/2023   Controlled type 2 diabetes mellitus without complication, without long-term current use of insulin  (HCC) 05/17/2023  Gout 02/22/2023   Hx of CABG    Chronic HFrEF (heart failure with reduced ejection fraction) (HCC)    Atherosclerosis of aorta 12/16/2020   Syncope 12/11/2020   Polycythemia 10/05/2020   Elevated troponin 03/07/2020   Chronic respiratory failure with hypoxia (HCC) 05/29/2019   Acute on chronic congestive heart failure (HCC) 04/18/2018   OSA (obstructive sleep apnea) 12/10/2015   Depression with anxiety 11/07/2015   BPH (benign prostatic hyperplasia) 08/01/2015   Unstable angina (HCC) 07/11/2015   Achalasia 07/24/2014   GERD without esophagitis 06/07/2014   Mixed hyperlipidemia 04/09/2013   PCP:  [name], MD Pharmacy:   [facility] - [place], [place] - [address] [place] [address] Phone: [phone]  Fax: [phone]  [facility] - [place], [place] - [address] [place] [address] Phone: [phone] Fax: [phone]  [id number] - [place], [place] - [address] AT [facility] [address] [place] [place] [address] Phone: [phone] Fax: [phone]  [facility] - [facility] [address] [place] [address] Phone: [phone] Fax: [phone]     Social Drivers of Health (SDOH) Social History: SDOH Screenings   Food Insecurity: No Food Insecurity (11/20/2024)  Recent Concern: Food Insecurity - Food Insecurity Present (10/17/2024)  Housing: Low Risk (11/20/2024)  Transportation Needs: Unmet Transportation Needs (11/20/2024)  Utilities: Not At Risk (11/20/2024)  Alcohol Screen: Low Risk (03/14/2024)  Depression (PHQ2-9): Medium Risk (10/31/2024)  Financial Resource Strain: Low Risk (06/19/2024)   Received from [facility]  Physical Activity: Insufficiently Active (03/14/2024)  Social Connections: Socially Isolated (11/19/2024)  Stress: No Stress Concern Present (03/14/2024)  Recent Concern: Stress - Stress Concern Present (01/13/2024)  Tobacco Use: Medium Risk (11/25/2024)  Health Literacy: Adequate Health Literacy (03/14/2024)   SDOH Interventions:     Readmission Risk Interventions    11/13/2024    3:56 PM 09/30/2023   10:28 AM 08/05/2023   10:11 AM  Readmission Risk Prevention Plan  Transportation Screening Complete Complete Complete  Medication Review (RN Care Manager) Complete Complete Complete  PCP or Specialist appointment within 3-5 days of discharge Complete Complete Complete  HRI or Home Care Consult Complete    SW Recovery Care/Counseling Consult Complete Complete Complete  Palliative Care Screening Not Applicable Not Applicable Not Applicable  Skilled Nursing Facility Not Applicable Not Applicable Not Applicable

## 2024-11-26 NOTE — TOC Transition Note (Signed)
 Transition of Care [facility]) - Discharge Note   Patient Details  Name: Lawrence [name] MRN: [id number] Date of Birth: 12-Mar-1956  Transition of Care [facility]) CM/SW Contact:  [name] L Sharyon Peitz, LCSW Phone Number: 11/26/2024, 5:31 PM   Clinical Narrative:     Recommendations reviewed with patient. Patient agreeable to discharge home. Patient advised that he has DME at home. No DME ordered this admission. Patient advised that he has the ability to private pay for DME if needed. Home Health options discussed. [facility] will provide services.   Patient will transport home in a taxi.   No further TOC needs. TOC signing off.          Patient Goals and CMS Choice            Discharge Placement                       Discharge Plan and Services Additional resources added to the After Visit Summary for                                       Social Drivers of Health (SDOH) Interventions SDOH Screenings   Food Insecurity: No Food Insecurity (11/20/2024)  Recent Concern: Food Insecurity - Food Insecurity Present (10/17/2024)  Housing: Low Risk (11/20/2024)  Transportation Needs: Unmet Transportation Needs (11/20/2024)  Utilities: Not At Risk (11/20/2024)  Alcohol Screen: Low Risk (03/14/2024)  Depression (PHQ2-9): Medium Risk (10/31/2024)  Financial Resource Strain: Low Risk (06/19/2024)   Received from [facility]  Physical Activity: Insufficiently Active (03/14/2024)  Social Connections: Socially Isolated (11/19/2024)  Stress: No Stress Concern Present (03/14/2024)  Recent Concern: Stress - Stress Concern Present (01/13/2024)  Tobacco Use: Medium Risk (11/25/2024)  Health Literacy: Adequate Health Literacy (03/14/2024)     Readmission Risk Interventions    11/13/2024    3:56 PM 09/30/2023   10:28 AM 08/05/2023   10:11 AM  Readmission Risk Prevention Plan  Transportation Screening Complete Complete Complete  Medication Review Oceanographer) Complete Complete Complete   PCP or Specialist appointment within 3-5 days of discharge Complete Complete Complete  HRI or Home Care Consult Complete    SW Recovery Care/Counseling Consult Complete Complete Complete  Palliative Care Screening Not Applicable Not Applicable Not Applicable  Skilled Nursing Facility Not Applicable Not Applicable Not Applicable

## 2024-11-26 NOTE — ED Notes (Signed)
 Pt ambulated to bathroom with assistance of front-wheel walker. Pt back in bed. Fall alarm on. No other needs at this time.

## 2024-11-26 NOTE — Evaluation (Signed)
 Occupational Therapy Evaluation Patient Details Name: Lawrence [name] MRN: [id number] DOB: Mar 30, 1956 Today's Date: 11/26/2024   History of Present Illness   Patient is a 69 year old male with chest pain , COPD exacerbation. Recent Flu. PMH of CAD (s/p of stent x 4/CABG 2016), A-fib on Eliquis , HFrEF, PSVT/frequent PVCs, HTN,DM, COPD on 2L O2, OSA not using CPAP, multiple prior hospitalizations related to chest pain/CHF/COPD     Clinical Impressions Patient was seen for OT evaluation this date. Prior to hospital admission, patient was living alone in senior apartment, reports his grandson recently had to move out and he has been having a difficult time managing ADLs and recently had a fall which is what brought him to the hospital he is on 2L of O2 at baseline, ambulated 40 feet with PT/OT and complained of 9/10 back pain (speaking normally and HR 80) and continued chest pain (RN aware and gave clearance for therapy eval). Patient feels he needs higher level of care and is open to ALF.  Patient presents with deficits in standing tolerance/balance and overall activity tolerance, affecting safe and optimal ADL completion. Patient is currently requiring min A for ADLs.  Paient would benefit from skilled OT services to address noted impairments and functional limitations (see below for any additional details) in order to maximize safety and independence while minimizing future risk of falls, injury, and readmission. Anticipate the need for follow up OT services upon acute hospital DC.      If plan is discharge home, recommend the following:   A little help with walking and/or transfers;A little help with bathing/dressing/bathroom;Assistance with cooking/housework;Assist for transportation     Functional Status Assessment   Patient has had a recent decline in their functional status and demonstrates the ability to make significant improvements in function in a reasonable and predictable amount of  time.     Equipment Recommendations   None recommended by OT;Other (comment)     Recommendations for Other Services         Precautions/Restrictions   Precautions Precautions: Fall Recall of Precautions/Restrictions: Intact Restrictions Weight Bearing Restrictions Per Provider Order: No     Mobility Bed Mobility Overal bed mobility: Needs Assistance Bed Mobility: Supine to Sit, Sit to Supine     Supine to sit: Min assist Sit to supine: Contact guard assist        Transfers Overall transfer level: Needs assistance Equipment used: Rolling walker (2 wheels) Transfers: Sit to/from Stand Sit to Stand: Contact guard assist           General transfer comment: slow but steady with rolling walker      Balance Overall balance assessment: Needs assistance Sitting-balance support: No upper extremity supported, Feet unsupported Sitting balance-Leahy Scale: Good     Standing balance support: Bilateral upper extremity supported Standing balance-Leahy Scale: Fair Standing balance comment: relying on rolling walker for support                           ADL either performed or assessed with clinical judgement   ADL Overall ADL's : Needs assistance/impaired                                       General ADL Comments: ADL/transfers using r/w with CGA, ambulates 40 feet on 2L, O2 92% and HR 88. anticipate patietn needing min A for ADLs  due to deconditioning     Vision         Perception         Praxis         Pertinent Vitals/Pain Pain Assessment Pain Assessment: 0-10 Pain Score: 9  Pain Location: back Pain Descriptors / Indicators: Aching Pain Intervention(s): Repositioned, Monitored during session, Limited activity within patient's tolerance     Extremity/Trunk Assessment Upper Extremity Assessment Upper Extremity Assessment: Generalized weakness   Lower Extremity Assessment Lower Extremity Assessment: Generalized  weakness   Cervical / Trunk Assessment Cervical / Trunk Assessment: Kyphotic   Communication Communication Communication: No apparent difficulties   Cognition Arousal: Alert Behavior During Therapy: WFL for tasks assessed/performed Cognition: History of cognitive impairments             OT - Cognition Comments: alert and oriented x4                 Following commands: Intact       Cueing  General Comments   Cueing Techniques: Verbal cues  pt feels generally fatigue and very weak compared to his normal   Exercises     Shoulder Instructions      Home Living Family/patient expects to be discharged to:: Private residence Living Arrangements: Other relatives Available Help at Discharge: Family;Available 24 hours/day Type of Home: Apartment Home Access: Level entry     Home Layout: One level     Bathroom Shower/Tub: Producer, Television/film/video: Standard Bathroom Accessibility: Yes How Accessible: Accessible via walker Home Equipment: Shower seat - built in;Rollator (4 wheels);BSC/3in1;Hospital bed;Grab bars - toilet;Grab bars - tub/shower          Prior Functioning/Environment Prior Level of Function : Independent/Modified Independent;History of Falls (last six months)             Mobility Comments: Modified independent ambulating with rollator.  2 L O2 at night (PRN during the day) ADLs Comments: grandson no longer lives with him, patient reports it has been tough doing things on his own.    OT Problem List: Decreased strength;Decreased activity tolerance;Impaired balance (sitting and/or standing)   OT Treatment/Interventions: Self-care/ADL training;Therapeutic exercise;Energy conservation;Therapeutic activities;Balance training;Patient/family education      OT Goals(Current goals can be found in the care plan section)   Acute Rehab OT Goals Patient Stated Goal: to get stronger and go to rehab OT Goal Formulation: With  patient Time For Goal Achievement: 12/10/24 Potential to Achieve Goals: Good ADL Goals Pt Will Perform Grooming: with modified independence;standing Pt Will Perform Lower Body Dressing: with modified independence;sit to/from stand Pt Will Transfer to Toilet: with modified independence;ambulating Pt Will Perform Toileting - Clothing Manipulation and hygiene: with modified independence;sit to/from stand   OT Frequency:  Min 2X/week    Co-evaluation PT/OT/SLP Co-Evaluation/Treatment: Yes Reason for Co-Treatment: To address functional/ADL transfers PT goals addressed during session: Mobility/safety with mobility OT goals addressed during session: ADL's and self-care      AM-PAC OT 6 Clicks Daily Activity     Outcome Measure Help from another person eating meals?: None Help from another person taking care of personal grooming?: A Little Help from another person toileting, which includes using toliet, bedpan, or urinal?: A Little Help from another person bathing (including washing, rinsing, drying)?: A Lot Help from another person to put on and taking off regular upper body clothing?: A Little Help from another person to put on and taking off regular lower body clothing?: A Little 6 Click Score: 18  End of Session Equipment Utilized During Treatment: Oxygen ;Rolling walker (2 wheels) Nurse Communication: Mobility status  Activity Tolerance: Patient tolerated treatment well Patient left: in bed;with call bell/phone within reach  OT Visit Diagnosis: Unsteadiness on feet (R26.81);Muscle weakness (generalized) (M62.81);History of falling (Z91.81)                Time: 0950-1003 OT Time Calculation (min): 13 min Charges:  OT General Charges $OT Visit: 1 Visit OT Evaluation $OT Eval Low Complexity: 1 Low  Rogers Clause, OT/L MSOT, 11/26/2024

## 2024-11-27 ENCOUNTER — Emergency Department (HOSPITAL_COMMUNITY)
Admission: EM | Admit: 2024-11-27 | Discharge: 2024-11-28 | Disposition: A | Attending: Emergency Medicine | Admitting: Emergency Medicine

## 2024-11-27 ENCOUNTER — Other Ambulatory Visit: Payer: Self-pay

## 2024-11-27 ENCOUNTER — Encounter (HOSPITAL_COMMUNITY): Payer: Self-pay

## 2024-11-27 ENCOUNTER — Emergency Department (HOSPITAL_COMMUNITY)

## 2024-11-27 DIAGNOSIS — Z7984 Long term (current) use of oral hypoglycemic drugs: Secondary | ICD-10-CM | POA: Diagnosis not present

## 2024-11-27 DIAGNOSIS — G8929 Other chronic pain: Secondary | ICD-10-CM | POA: Insufficient documentation

## 2024-11-27 DIAGNOSIS — R0789 Other chest pain: Secondary | ICD-10-CM | POA: Insufficient documentation

## 2024-11-27 DIAGNOSIS — I110 Hypertensive heart disease with heart failure: Secondary | ICD-10-CM | POA: Insufficient documentation

## 2024-11-27 DIAGNOSIS — Z7901 Long term (current) use of anticoagulants: Secondary | ICD-10-CM | POA: Insufficient documentation

## 2024-11-27 DIAGNOSIS — Z951 Presence of aortocoronary bypass graft: Secondary | ICD-10-CM | POA: Diagnosis not present

## 2024-11-27 DIAGNOSIS — I2510 Atherosclerotic heart disease of native coronary artery without angina pectoris: Secondary | ICD-10-CM | POA: Insufficient documentation

## 2024-11-27 DIAGNOSIS — M546 Pain in thoracic spine: Secondary | ICD-10-CM | POA: Diagnosis not present

## 2024-11-27 DIAGNOSIS — M542 Cervicalgia: Secondary | ICD-10-CM | POA: Insufficient documentation

## 2024-11-27 DIAGNOSIS — J449 Chronic obstructive pulmonary disease, unspecified: Secondary | ICD-10-CM | POA: Insufficient documentation

## 2024-11-27 DIAGNOSIS — Z85828 Personal history of other malignant neoplasm of skin: Secondary | ICD-10-CM | POA: Diagnosis not present

## 2024-11-27 DIAGNOSIS — E119 Type 2 diabetes mellitus without complications: Secondary | ICD-10-CM | POA: Diagnosis not present

## 2024-11-27 DIAGNOSIS — Z79899 Other long term (current) drug therapy: Secondary | ICD-10-CM | POA: Insufficient documentation

## 2024-11-27 DIAGNOSIS — Z87891 Personal history of nicotine dependence: Secondary | ICD-10-CM | POA: Insufficient documentation

## 2024-11-27 DIAGNOSIS — R079 Chest pain, unspecified: Secondary | ICD-10-CM | POA: Diagnosis present

## 2024-11-27 DIAGNOSIS — I5032 Chronic diastolic (congestive) heart failure: Secondary | ICD-10-CM | POA: Insufficient documentation

## 2024-11-27 LAB — BASIC METABOLIC PANEL WITH GFR
Anion gap: 7 (ref 5–15)
BUN: 11 mg/dL (ref 8–23)
CO2: 31 mmol/L (ref 22–32)
Calcium: 9.2 mg/dL (ref 8.9–10.3)
Chloride: 103 mmol/L (ref 98–111)
Creatinine, Ser: 0.84 mg/dL (ref 0.61–1.24)
GFR, Estimated: >60 mL/min
Glucose, Bld: 97 mg/dL (ref 70–99)
Potassium: 4 mmol/L (ref 3.5–5.1)
Sodium: 141 mmol/L (ref 135–145)

## 2024-11-27 LAB — CBC
HCT: 50.9 % (ref 39.0–52.0)
Hemoglobin: 16.3 g/dL (ref 13.0–17.0)
MCH: 30.6 pg (ref 26.0–34.0)
MCHC: 32 g/dL (ref 30.0–36.0)
MCV: 95.7 fL (ref 80.0–100.0)
Platelets: 207 K/uL (ref 150–400)
RBC: 5.32 MIL/uL (ref 4.22–5.81)
RDW: 15.5 % (ref 11.5–15.5)
WBC: 7.9 K/uL (ref 4.0–10.5)
nRBC: 0 % (ref 0.0–0.2)

## 2024-11-27 LAB — TROPONIN T, HIGH SENSITIVITY
Troponin T High Sensitivity: 19 ng/L (ref 0–19)
Troponin T High Sensitivity: 19 ng/L (ref 0–19)

## 2024-11-27 NOTE — ED Provider Triage Note (Addendum)
 Emergency Medicine Provider Triage Evaluation Note  Lawrence [name] , a 69 y.o. male  was evaluated in triage.  Pt complains of chronic chest pain. Reports he has been having chest pain and lower back pain for the past few days. These are chronic and unchanged. Was seen at [facility] yesterday and was TOC hold for possible SNF placement. Reports compliancy with his medications. Patient reports that he is still in pain prompting his return to the ER  Review of Systems  Positive:  Negative:   Physical Exam  BP (!) 184/117 (BP Location: Right Arm)   Pulse (!) 109   Temp (!) 97.5 F (36.4 C)   Resp 17   Ht 5' 7 (1.702 m)   Wt 79.8 kg   SpO2 97%   BMI 27.57 kg/m  Gen:   Awake, no distress   Resp:  Normal effort  MSK:   Moves extremities without difficulty  Other:  NAD.  Medical Decision Making  Medically screening exam initiated at 9:10 PM.  Appropriate orders placed.  Lawrence [name] was informed that the remainder of the evaluation will be completed by another provider, this initial triage assessment does not replace that evaluation, and the importance of remaining in the ED until their evaluation is complete.  Cardiac workup initiated. Care plan reviewed.    Bernis Ernst, PA-C 11/27/24 2111    Bernis Ernst, PA-C 11/27/24 2112

## 2024-11-27 NOTE — ED Triage Notes (Signed)
 Pt arrived from home via POV c/o chest pain 9/10 described as sharp and stabbing and back pain 9/10 described as aching. Pt also states that he was hypertensive at home 177/114

## 2024-11-28 ENCOUNTER — Telehealth: Payer: Self-pay

## 2024-11-28 ENCOUNTER — Other Ambulatory Visit: Payer: Self-pay

## 2024-11-28 MED ORDER — ACETAMINOPHEN 500 MG PO TABS
1000.0000 mg | ORAL_TABLET | Freq: Once | ORAL | Status: AC
  Administered 2024-11-28: 1000 mg via ORAL
  Filled 2024-11-28: qty 2

## 2024-11-28 MED ORDER — OXYCODONE HCL 5 MG PO TABS
10.0000 mg | ORAL_TABLET | Freq: Once | ORAL | Status: AC
  Administered 2024-11-28: 10 mg via ORAL
  Filled 2024-11-28: qty 2

## 2024-11-28 MED ORDER — KETOROLAC TROMETHAMINE 60 MG/2ML IM SOLN
15.0000 mg | Freq: Once | INTRAMUSCULAR | Status: AC
  Administered 2024-11-28: 15 mg via INTRAMUSCULAR
  Filled 2024-11-28: qty 2

## 2024-11-28 MED ORDER — ONDANSETRON 4 MG PO TBDP
4.0000 mg | ORAL_TABLET | Freq: Once | ORAL | Status: AC
  Administered 2024-11-28: 4 mg via ORAL
  Filled 2024-11-28: qty 1

## 2024-11-28 MED ORDER — LIDOCAINE 5 % EX PTCH
1.0000 | MEDICATED_PATCH | CUTANEOUS | Status: DC
  Administered 2024-11-28: 1 via TRANSDERMAL
  Filled 2024-11-28: qty 1

## 2024-11-28 NOTE — ED Notes (Addendum)
 Pt c/o chest pain getting worse. Pt brought back to triage with repeat ekg completed and taken to MD for review.

## 2024-11-28 NOTE — Transitions of Care (Post Inpatient/ED Visit) (Signed)
 " Transition of Care week 2  Visit Note  11/28/2024  Name: Lawrence [name] MRN: [id number]          DOB: 1956/07/18  Situation: Patient enrolled in [facility] 30-day program. Visit completed with patient by telephone.   Background:   Initial Transition Care Management Follow-up Telephone Call Discharge Date and Diagnosis: 11/19/24, COPD with AAcute Exacerbation   Past Medical History:  Diagnosis Date   A-fib (HCC)    Anemia    Anginal pain    Anxiety    Arthritis    Asthma    Atrial fibrillation, chronic (HCC) 10/19/2023   CAD (coronary artery disease)    a. 2002 CABGx2 (LIMA->LAD, VG->VG->OM1);  b. 09/2012 DES->OM;  c. 03/2015 PTCA of LAD [facility]) in setting of atretic LIMA; d. 05/2015 Cath [facility]): nonobs dzs; e. 06/2015 Cath ([facility]): LM nl, LAD 45p/d ISR, 50d, D1/2 small, LCX 50p/d ISR, OM1 70ost, 30 ISR, VG->OM1 50ost, 64m, LIMA->LAD 99p/d - atretic, RCA dom, nl; f.cath 10/16: 40-50%(FFR 0.90) pLAD, 75% (FFR 0.77) mLAD s/p PCI/DES, oRCA 40% (FFR0.95)   Cancer (HCC)    SKIN CANCER ON BACK   Celiac disease    Chronic diastolic CHF (congestive heart failure) (HCC)    a. 06/2009 Echo: EF 60-65%, Gr 1 DD, triv AI, mildly dil LA, nl RV.   COPD (chronic obstructive pulmonary disease) (HCC)    a. Chronic bronchitis and emphysema.   DDD (degenerative disc disease), lumbar    Diverticulosis    Dysrhythmia    Essential hypertension    GERD (gastroesophageal reflux disease)    History of hiatal hernia    History of kidney stones    H/O   History of tobacco abuse    a. Quit 2014.   Myocardial infarction [facility]) 2002   4 STENTS   Pancreatitis    PSVT (paroxysmal supraventricular tachycardia)    a. 10/2012 Noted on Zio Patch.   RLL pneumonia 05/30/2024   Sleep apnea    LOST CORD TO CPAP -ONLY 02 @ BEDTIME   Tubular adenoma of colon    Type II diabetes mellitus (HCC)     Assessment: Patient Reported Symptoms: Cognitive Cognitive Status: Able to follow simple commands, Alert and oriented to  person, place, and time, Normal speech and language skills      Neurological Neurological Review of Symptoms: Weakness Neurological Management Strategies: Medical device, Medication therapy, Routine screening Neurological Self-Management Outcome: 3 (uncertain) Neurological Comment: Chronic pain  HEENT HEENT Symptoms Reported: No symptoms reported, Nasal discharge, Tinnitus (chronic issues) HEENT Management Strategies: Activity, Medication therapy, Routine screening    Cardiovascular Cardiovascular Symptoms Reported: Chest pain or discomfort (they say it chronic) Does patient have uncontrolled Hypertension?: Yes Is patient checking Blood Pressure at home?: Yes Patient's Recent BP reading at home: no checked at home today was 150/109 at the hospital this morning when I was in the ED Cardiovascular Management Strategies: Medical device, Medication therapy, Routine screening Do You Have a Working Readable Scale?: Yes Weight: 176 lb (79.8 kg) Cardiovascular Self-Management Outcome: 3 (uncertain)  Respiratory Respiratory Symptoms Reported: No symptoms reported Respiratory Management Strategies: Adequate rest, CPAP, Oxygen  therapy, Medication therapy, Routine screening Respiratory Self-Management Outcome: 3 (uncertain)  Endocrine Endocrine Symptoms Reported: No symptoms reported Is patient diabetic?: Yes Is patient checking blood sugars at home?: Yes    Gastrointestinal Gastrointestinal Symptoms Reported: No symptoms reported      Genitourinary Genitourinary Symptoms Reported: Difficulty initiating stream Other Genitourinary Symptoms: chronic, ongoing    Integumentary Integumentary  Symptoms Reported: No symptoms reported Skin Management Strategies: Routine screening Skin Self-Management Outcome: 4 (good)  Musculoskeletal Musculoskelatal Symptoms Reviewed: Back pain, Unsteady gait, Weakness Additional Musculoskeletal Details: Ongoing chronic issues states pain medications just make it  tolerable Musculoskeletal Management Strategies: Activity, Adequate rest, Fluid modification, Routine screening Musculoskeletal Self-Management Outcome: 3 (uncertain)      Psychosocial Additional Psychological Details: Hx of depression Behavioral Management Strategies: Medication therapy Behavioral Health Self-Management Outcome: 3 (uncertain) Behavioral Health Comment: Denies issues       Today's Vitals   11/28/24 1300  Weight: 176 lb (79.8 kg)   Pain Scale: 0-10 Pain Score: 4  Pain Type: Chronic pain Pain Location: Back Pain Orientation: Lower, Mid Pain Descriptors / Indicators: Nagging Pain Onset: On-going  Medications Reviewed Today     Reviewed by Eilleen Richerd GRADE, RN (Registered Nurse) on 11/28/24 at 1316  Med List Status: <None>   Medication Order Taking? Sig Documenting Provider Last Dose Status Informant  Accu-Chek Softclix Lancets lancets [id number]  Use as instructed to check sugar daily for type 2 diabetes. [name], MD  Active Self  acetaminophen  (TYLENOL ) 325 MG tablet [id number] Yes Take 2 tablets (650 mg total) by mouth every 6 (six) hours as needed for mild pain (pain score 1-3) or fever (or Fever >/= 101). Dorinda Drue DASEN, MD  Active Self           Med Note VASHTI INOCENTE PARAS   Dju Nov 18, 2024  2:17 AM)    albuterol  (PROVENTIL ) (2.5 MG/3ML) 0.083% nebulizer solution [id number] Yes Take 3 mLs (2.5 mg total) by nebulization every 6 (six) hours as needed for wheezing or shortness of breath. [name], MD  Active Self           Med Note [name] Nov 22, 2024  8:45 PM)    allopurinol  (ZYLOPRIM ) 300 MG tablet [id number] Yes TAKE 1 TABLET BY MOUTH TWICE A DAY [name], MD  Active Self  ALPRAZolam  (XANAX ) 1 MG tablet [id number]  TAKE 1 TABLET BY MOUTH 3 TIMES A DAY AS NEEDED  Patient taking differently: Take 1 mg by mouth 3 (three) times daily.   [name], MD  Active Self           Med Note VALLERIE, LUCIE CHRISTELLA Stevan Nov 22, 2024   8:57 PM)    apixaban  (ELIQUIS ) 5 MG TABS tablet [id number] Yes Take 1 tablet (5 mg total) by mouth 2 (two) times daily. [name], MD  Active Self  atorvastatin  (LIPITOR ) 80 MG tablet [id number] Yes TAKE 1 TABLET BY MOUTH AT BEDTIME [name], MD  Active Self  Blood Glucose Monitoring Suppl (ACCU-CHEK GUIDE) w/Device KIT [id number]  Use to check blood sugars as directed [name], MD  Active Self  Blood Pressure Monitor DEVI [id number]  Use to check blood pressure daily [name], MD  Active Self  BREZTRI  AEROSPHERE 160-9-4.8 MCG/ACT AERO inhaler [id number] Yes INHALE 2 PUFFS BY MOUTH INTO THE LUNGS 2TIMES DAILY [name], MD  Active Self  cetirizine  (ZYRTEC ) 10 MG tablet [id number] Yes TAKE 1 TABLET BY MOUTH AT BEDTIME [name], MD  Active Self  cholecalciferol  (VITAMIN D3) 25 MCG (1000 UNIT) tablet [id number] Yes Take 1,000 Units by mouth daily. [provider]  Active Self  Cyanocobalamin  (VITAMIN B-12) 1000 MCG SUBL [id number] Yes Place 1 tablet under the tongue daily. [provider]  Active Self  dapagliflozin  propanediol (FARXIGA ) 10 MG TABS tablet [id number] Yes TAKE 1 TABLET BY MOUTH DAILY [name], MD  Active Self  famotidine  (PEPCID ) 20 MG tablet [id number] Yes TAKE 1 TABLET BY MOUTH TWICE A DAY [name], MD  Active Self  furosemide  (LASIX ) 20 MG tablet [id number]  Take 20 mg by mouth as needed for fluid or edema. [provider]  Active Self  glucose blood (ACCU-CHEK GUIDE) test strip [id number]  Use as instructed to check sugar daily for type 2 diabetes. [name], MD  Active Self  iron  polysaccharides [facility] 150) 150 MG capsule [id number] Yes Take one capsul every Monday, Wednesday, and Friday [name], MD  Active Self  metFORMIN  (GLUCOPHAGE -XR) 500 MG 24 hr tablet [id number] Yes TAKE 1 TABLET BY MOUTH DAILY WITH EVENING MEAL [name], MD  Active Self  methocarbamol  (ROBAXIN ) 500 MG tablet  [id number]  TAKE 1 TABLET BY MOUTH EVERY 8 HOURS AS NEEDED FOR MUSCLE SPASMS  Patient taking differently: Take 500 mg by mouth every 8 (eight) hours.   [name], MD  Active Self           Med Note VALLERIE, LUCIE CHRISTELLA [place] Nov 22, 2024  8:56 PM)    metoprolol  succinate (TOPROL -XL) 50 MG 24 hr tablet [id number] Yes Take 1 tablet (50 mg total) by mouth daily. Take with or immediately following a meal. [name], MD  Active Self  nitroGLYCERIN  (NITROSTAT ) 0.4 MG SL tablet [id number] Yes Place 1 tablet (0.4 mg total) under the tongue every 5 (five) minutes as needed for chest pain (Do not take if blood pressure is low, systolic BP<110). [name], MD  Active Self           Med Note VALLERIE LUCIE CHRISTELLA [place] Nov 22, 2024  8:54 PM)    omega-3 acid ethyl esters (LOVAZA ) 1 g capsule [id number] Yes TAKE 4 CAPSULES (4 GRAMS TOTAL) BY WARDEN [name], MD  Active Self  oxyCODONE -acetaminophen  (PERCOCET) 10-325 MG tablet [id number]  TAKE 1 TABLET BY MOUTH EVERY 4 HOURS AS NEEDED FOR PAIN  Patient taking differently: Take 1 tablet by mouth every 4 (four) hours. for pain   [name], MD  Active Self           Med Note VALLERIE LUCIE CHRISTELLA [place] Nov 22, 2024  8:54 PM)    OXYGEN  [id number] Yes Inhale 2 L into the lungs at bedtime as needed (for shortness of breath). [provider]  Active Self  pantoprazole  (PROTONIX ) 40 MG tablet [id number]  TAKE 1 TABLET BY MOUTH 2 TIMES DAILY ( BEFORE A MEAL)  Patient taking differently: Take 40 mg by mouth daily before breakfast.   [name], MD  Active Self  ranolazine  (RANEXA ) 1000 MG SR tablet [id number]  Take 1,000 mg by mouth 2 (two) times daily.  Patient not taking: Reported on 11/22/2024   [provider]  Active Self  sacubitril -valsartan  (ENTRESTO ) 49-51 MG [id number] Yes TAKE 1 TABLET BY MOUTH TWICE A DAY [name], MD  Active Self  spironolactone  (ALDACTONE ) 25 MG tablet [id number]  Take 25 mg  by mouth 2 (two) times daily.  Patient not taking: Reported on 11/26/2024   [provider]  Active Self           Med Note [name], [name] Nov 28, 2024  1:15 PM) To talk to Dr. Gasper on  Friday  traZODone  (DESYREL ) 150 MG tablet [id number]  TAKE 1 TABLET BY MOUTH AT BEDTIME AS NEEDED FOR SLEEP.  Patient taking differently: Take 150 mg by mouth at bedtime. for sleep   [name], MD  Active Self           Med Note [name]   Dju Nov 11, 2024  3:23 PM)              Recommendation:   Continue Current Plan of Care Continue to keep appointment with [facility] for ongoing transportation needs.  Follow Up Plan:   Telephone follow-up in 1 week  Richerd Fish, RN, BSN, CCM [facility], [facility] Management Coordinator Direct Dial: [phone]           "

## 2024-11-28 NOTE — ED Provider Notes (Signed)
 " [place] EMERGENCY DEPARTMENT AT [facility] Provider Note  CSN: [id number] Arrival date & time: 11/27/24 2018  Chief Complaint(s) Chest Pain and Back Pain  History provided by patient. HPI & MDM [name] is a 69 y.o. male with extensive past medical history listed below including atrial fibrillation on chronic anticoagulation, CAD with prior CABG and stenting, COPD, chronic pain.  He presents for exacerbation of chronic back and chest pain.  He reports that the pain became worse around 2 PM yesterday afternoon and has persisted since.  Worse with movement.  Patient denies any recent falls but was seen at [facility] 3 days ago mechanical fall at home.  Pain did not improve after taking a medication.  Chest Pain Associated symptoms: back pain   Back Pain Associated symptoms: chest pain       Medical Decision Making Risk OTC drugs. Prescription drug management.    Chest pain/back pain Chronic pain patient.  Exacerbated. Most consistent with MSK pain Given patient's significant cardiac history, EKG obtained which did not show any acute ischemic changes.  Serial troponins were negative x 2.  More than 7 hours after pain onset.  Given the constant nature of pain, feel this is sufficient to rule out ACS. Low suspicion for aortic dissection, PE, or esophageal perforation. Chest x-ray without evidence of pneumonia, pneumothorax, pulmonary edema, pleural effusions. Labs reassuring without leukocytosis or anemia, no significant electrolyte derangements or renal sufficiency.  Final Clinical Impression(s) / ED Diagnoses Final diagnoses:  Chest wall pain  Chronic bilateral thoracic back pain   The patient appears reasonably screened and/or stabilized for discharge and I doubt any other medical condition or other [facility] requiring further screening, evaluation, or treatment in the ED at this time. I have discussed the findings, Dx and Tx plan with the patient/family who  expressed understanding and agree(s) with the plan. Discharge instructions discussed at length. The patient/family was given strict return precautions who verbalized understanding of the instructions. No further questions at time of discharge.  Disposition: Discharge  Condition: Good  ED Discharge Orders     None       Follow Up: Lawrence Nancyann BRAVO, MD [address] [place] [address] [phone]  Call  to schedule an appointment for close follow up     Past Medical History Past Medical History:  Diagnosis Date   A-fib [facility])    Anemia    Anginal pain    Anxiety    Arthritis    Asthma    Atrial fibrillation, chronic (HCC) 10/19/2023   CAD (coronary artery disease)    a. 2002 CABGx2 (LIMA->LAD, VG->VG->OM1);  b. 09/2012 DES->OM;  c. 03/2015 PTCA of LAD [facility]) in setting of atretic LIMA; d. 05/2015 Cath [facility]): nonobs dzs; e. 06/2015 Cath ([facility]): LM nl, LAD 45p/d ISR, 50d, D1/2 small, LCX 50p/d ISR, OM1 70ost, 30 ISR, VG->OM1 50ost, 44m, LIMA->LAD 99p/d - atretic, RCA dom, nl; f.cath 10/16: 40-50%(FFR 0.90) pLAD, 75% (FFR 0.77) mLAD s/p PCI/DES, oRCA 40% (FFR0.95)   Cancer (HCC)    SKIN CANCER ON BACK   Celiac disease    Chronic diastolic CHF (congestive heart failure) (HCC)    a. 06/2009 Echo: EF 60-65%, Gr 1 DD, triv AI, mildly dil LA, nl RV.   COPD (chronic obstructive pulmonary disease) (HCC)    a. Chronic bronchitis and emphysema.   DDD (degenerative disc disease), lumbar    Diverticulosis    Dysrhythmia    Essential hypertension  GERD (gastroesophageal reflux disease)    History of hiatal hernia    History of kidney stones    H/O   History of tobacco abuse    a. Quit 2014.   Myocardial infarction [facility]) 2002   4 STENTS   Pancreatitis    PSVT (paroxysmal supraventricular tachycardia)    a. 10/2012 Noted on Zio Patch.   RLL pneumonia 05/30/2024   Sleep apnea    LOST CORD TO CPAP -ONLY 02 @ BEDTIME   Tubular adenoma of colon    Type II diabetes  mellitus (HCC)    Patient Active Problem List   Diagnosis Date Noted   Atrial fibrillation with rapid ventricular response (HCC) 11/12/2024   Influenza A 11/11/2024   Afib (HCC) 11/11/2024   COPD exacerbation (HCC) 10/17/2024   Anxiety and depression 10/17/2024   Chronic atrial fibrillation with RVR (HCC) 10/17/2024   Coronary artery disease 10/17/2024   Hypocalcemia 10/06/2024   Protein-calorie malnutrition, severe 10/06/2024   Substernal precordial chest pain 10/05/2024   Right upper quadrant abdominal pain 08/14/2024   Elevated hemoglobin 08/14/2024   COPD (chronic obstructive pulmonary disease) (HCC) 06/10/2024   Bipolar disorder (HCC) 06/10/2024   Mild cognitive impairment 06/09/2024   Atrial fibrillation with RVR (HCC) 05/30/2024   Hypotension 10/18/2023   COPD with acute exacerbation (HCC) 10/18/2023   Generalized weakness 10/18/2023   Left hip pain 10/13/2023   Malfunction of continuous or biphasic positive airway pressure machine 10/13/2023   Overweight (BMI 25.0-29.9) 10/02/2023   Chest pain 10/01/2023   Near syncope 09/29/2023   Dyslipidemia 09/29/2023   Anxiety 09/29/2023   Atrial fibrillation, chronic (HCC) 09/29/2023   NSTEMI (non-ST elevated myocardial infarction) (HCC) 08/04/2023   CAD S/P percutaneous coronary angioplasty 08/04/2023   PAD (peripheral artery disease) 07/11/2023   Controlled type 2 diabetes mellitus without complication, without long-term current use of insulin  (HCC) 05/17/2023   Gout 02/22/2023   Hx of CABG    Chronic HFrEF (heart failure with reduced ejection fraction) (HCC)    Atherosclerosis of aorta 12/16/2020   Syncope 12/11/2020   Polycythemia 10/05/2020   Elevated troponin 03/07/2020   Chronic respiratory failure with hypoxia (HCC) 05/29/2019   Acute on chronic congestive heart failure (HCC) 04/18/2018   OSA (obstructive sleep apnea) 12/10/2015   Depression with anxiety 11/07/2015   BPH (benign prostatic hyperplasia) 08/01/2015    Unstable angina (HCC) 07/11/2015   Achalasia 07/24/2014   GERD without esophagitis 06/07/2014   Mixed hyperlipidemia 04/09/2013   Home Medication(s) Prior to Admission medications  Medication Sig Start Date End Date Taking? Authorizing Provider  Accu-Chek Softclix Lancets lancets Use as instructed to check sugar daily for type 2 diabetes. 03/03/21   Lawrence Nancyann BRAVO, MD  acetaminophen  (TYLENOL ) 325 MG tablet Take 2 tablets (650 mg total) by mouth every 6 (six) hours as needed for mild pain (pain score 1-3) or fever (or Fever >/= 101). 08/15/24   Dorinda Drue DASEN, MD  albuterol  (PROVENTIL ) (2.5 MG/3ML) 0.083% nebulizer solution Take 3 mLs (2.5 mg total) by nebulization every 6 (six) hours as needed for wheezing or shortness of breath. 05/26/24   Lawrence Nancyann BRAVO, MD  allopurinol  (ZYLOPRIM ) 300 MG tablet TAKE 1 TABLET BY MOUTH TWICE A DAY 04/12/24   Lawrence Nancyann BRAVO, MD  ALPRAZolam  (XANAX ) 1 MG tablet TAKE 1 TABLET BY MOUTH 3 TIMES A DAY AS NEEDED Patient taking differently: Take 1 mg by mouth 3 (three) times daily. 09/16/24   Lawrence Nancyann BRAVO, MD  apixaban  (ELIQUIS )  5 MG TABS tablet Take 1 tablet (5 mg total) by mouth 2 (two) times daily. 10/04/23   Lawrence Nancyann BRAVO, MD  atorvastatin  (LIPITOR ) 80 MG tablet TAKE 1 TABLET BY MOUTH AT BEDTIME 07/15/23   Lawrence Nancyann BRAVO, MD  Blood Glucose Monitoring Suppl (ACCU-CHEK GUIDE) w/Device KIT Use to check blood sugars as directed 12/01/23   Lawrence Nancyann BRAVO, MD  Blood Pressure Monitor DEVI Use to check blood pressure daily 07/27/24   Lawrence Nancyann BRAVO, MD  BREZTRI  AEROSPHERE 160-9-4.8 MCG/ACT AERO inhaler INHALE 2 PUFFS BY MOUTH INTO THE LUNGS 2TIMES DAILY 06/05/24   Lawrence Nancyann BRAVO, MD  cetirizine  (ZYRTEC ) 10 MG tablet TAKE 1 TABLET BY MOUTH AT BEDTIME 11/14/24   Lawrence Nancyann BRAVO, MD  cholecalciferol  (VITAMIN D3) 25 MCG (1000 UNIT) tablet Take 1,000 Units by mouth daily.    [provider]  Cyanocobalamin  (VITAMIN B-12) 1000 MCG SUBL Place 1 tablet under  the tongue daily.    [provider]  dapagliflozin  propanediol (FARXIGA ) 10 MG TABS tablet TAKE 1 TABLET BY MOUTH DAILY 03/28/24   Lawrence Nancyann BRAVO, MD  famotidine  (PEPCID ) 20 MG tablet TAKE 1 TABLET BY MOUTH TWICE A DAY 08/23/24   Lawrence Nancyann BRAVO, MD  furosemide  (LASIX ) 20 MG tablet Take 20 mg by mouth as needed for fluid or edema.    [provider]  glucose blood (ACCU-CHEK GUIDE) test strip Use as instructed to check sugar daily for type 2 diabetes. 03/03/21   Lawrence Nancyann BRAVO, MD  iron  polysaccharides (FERREX 150) 150 MG capsule Take one capsul every Monday, Wednesday, and Friday 06/09/24   Lawrence Nancyann BRAVO, MD  metFORMIN  (GLUCOPHAGE -XR) 500 MG 24 hr tablet TAKE 1 TABLET BY MOUTH DAILY WITH EVENING MEAL 07/11/24   Lawrence Nancyann BRAVO, MD  methocarbamol  (ROBAXIN ) 500 MG tablet TAKE 1 TABLET BY MOUTH EVERY 8 HOURS AS NEEDED FOR MUSCLE SPASMS Patient taking differently: Take 500 mg by mouth every 8 (eight) hours. 10/23/24   Lawrence Nancyann BRAVO, MD  metoprolol  succinate (TOPROL -XL) 50 MG 24 hr tablet Take 1 tablet (50 mg total) by mouth daily. Take with or immediately following a meal. 11/20/24 12/20/24  [name], MD  nitroGLYCERIN  (NITROSTAT ) 0.4 MG SL tablet Place 1 tablet (0.4 mg total) under the tongue every 5 (five) minutes as needed for chest pain (Do not take if blood pressure is low, systolic BP<110). 10/21/23   [name], MD  omega-3 acid ethyl esters (LOVAZA ) 1 g capsule TAKE 4 CAPSULES (4 GRAMS TOTAL) BY MOUTHDAILY 03/02/24   Lawrence Nancyann BRAVO, MD  oxyCODONE -acetaminophen  (PERCOCET) 10-325 MG tablet TAKE 1 TABLET BY MOUTH EVERY 4 HOURS AS NEEDED FOR PAIN Patient taking differently: Take 1 tablet by mouth every 4 (four) hours. for pain 11/10/24   Lawrence Nancyann BRAVO, MD  OXYGEN  Inhale 2 L into the lungs at bedtime as needed (for shortness of breath).    [provider]  pantoprazole  (PROTONIX ) 40 MG tablet TAKE 1 TABLET BY MOUTH 2 TIMES DAILY (30MINUTES BEFORE A  MEAL) Patient taking differently: Take 40 mg by mouth daily before breakfast. 01/12/24   Lawrence Nancyann BRAVO, MD  ranolazine  (RANEXA ) 1000 MG SR tablet Take 1,000 mg by mouth 2 (two) times daily. Patient not taking: Reported on 11/22/2024    [provider]  sacubitril -valsartan  (ENTRESTO ) 49-51 MG TAKE 1 TABLET BY MOUTH TWICE A DAY 11/14/24   Lawrence Nancyann BRAVO, MD  spironolactone  (ALDACTONE ) 25 MG tablet Take 25 mg by mouth 2 (  two) times daily. Patient not taking: Reported on 11/26/2024    [provider]  traZODone  (DESYREL ) 150 MG tablet TAKE 1 TABLET BY MOUTH AT BEDTIME AS NEEDED FOR SLEEP. Patient taking differently: Take 150 mg by mouth at bedtime. for sleep 09/10/24   Lawrence Nancyann BRAVO, MD                                                                                                                                    Allergies Demerol  [meperidine hcl], Demerol [meperidine], Morphine , Sulfa antibiotics, Albuterol , Empagliflozin , and Morphine  sulfate  Review of Systems Review of Systems  Cardiovascular:  Positive for chest pain.  Musculoskeletal:  Positive for back pain.   As noted in HPI  Physical Exam Vital Signs  I have reviewed the triage vital signs BP (!) 150/109   Pulse 76   Temp (!) 97.5 F (36.4 C)   Resp 17   Ht 5' 7 (1.702 m)   Wt 79.8 kg   SpO2 100%   BMI 27.57 kg/m   Physical Exam Vitals reviewed.  Constitutional:      General: He is not in acute distress.    Appearance: He is well-developed. He is not diaphoretic.  HENT:     Head: Normocephalic and atraumatic.     Nose: Nose normal.  Eyes:     General: No scleral icterus.       Right eye: No discharge.        Left eye: No discharge.     Conjunctiva/sclera: Conjunctivae normal.     Pupils: Pupils are equal, round, and reactive to light.  Cardiovascular:     Rate and Rhythm: Normal rate and regular rhythm.     Heart sounds: No murmur heard.    No friction rub. No gallop.  Pulmonary:      Effort: Pulmonary effort is normal. No respiratory distress.     Breath sounds: Normal breath sounds. No stridor. No rales.  Chest:     Chest wall: Tenderness present.    Abdominal:     General: There is no distension.     Palpations: Abdomen is soft.     Tenderness: There is no abdominal tenderness.  Musculoskeletal:     Cervical back: Normal range of motion and neck supple. Tenderness present. No bony tenderness.     Thoracic back: Tenderness present. No bony tenderness.  Skin:    General: Skin is warm and dry.     Findings: No erythema or rash.  Neurological:     Mental Status: He is alert and oriented to person, place, and time.     ED Results and Treatments Labs (all labs ordered are listed, but only abnormal results are displayed) Labs Reviewed  BASIC METABOLIC PANEL WITH GFR  CBC  TROPONIN T, HIGH SENSITIVITY  TROPONIN T, HIGH SENSITIVITY  EKG  EKG Interpretation Date/Time:  Tuesday November 28 2024 04:37:00 EST Ventricular Rate:  93 PR Interval:    QRS Duration:  88 QT Interval:  352 QTC Calculation: 437 R Axis:   66  Text Interpretation: Atrial fibrillation Cannot rule out Inferior infarct , age undetermined Anterior infarct , age undetermined Abnormal ECG When compared with ECG of 27-Nov-2024 20:44, PREVIOUS ECG IS PRESENT Confirmed by Trine Likes [phone]) on 11/28/2024 4:55:29 AM       Radiology DG Chest 2 View Result Date: 11/27/2024 EXAM: 2 VIEW(S) XRAY OF THE CHEST 11/27/2024 09:37:07 PM COMPARISON: 11/26/2024 CLINICAL HISTORY: CP FINDINGS: LUNGS AND PLEURA: No focal pulmonary opacity. No pleural effusion. No pneumothorax. HEART AND MEDIASTINUM: Post-CABG changes. Calcified aorta. BONES AND SOFT TISSUES: Sternotomy wires noted. Thoracic degenerative changes. IMPRESSION: 1. No acute cardiopulmonary abnormality. 2. Post-CABG changes.  Electronically signed by: Oneil Devonshire MD MD 11/27/2024 10:03 PM EST RP Workstation: [id number]    Medications Ordered in ED Medications  lidocaine  (LIDODERM ) 5 % 1 patch (1 patch Transdermal Patch Applied 11/28/24 0615)  ketorolac  (TORADOL ) injection 15 mg (15 mg Intramuscular Given 11/28/24 0614)  acetaminophen  (TYLENOL ) tablet 1,000 mg (1,000 mg Oral Given 11/28/24 0614)  oxyCODONE  (Oxy IR/ROXICODONE ) immediate release tablet 10 mg (10 mg Oral Given 11/28/24 0614)  ondansetron  (ZOFRAN -ODT) disintegrating tablet 4 mg (4 mg Oral Given 11/28/24 9287)   Procedures Procedures  (including critical care time)   This chart was dictated using voice recognition software.  Despite best efforts to proofread,  errors can occur which can change the documentation meaning.   Trine Likes Moder, MD 11/28/24 [phone]  "

## 2024-11-28 NOTE — Patient Instructions (Signed)
 Visit Information  Thank you for taking time to visit with me today. Please don't hesitate to contact me if I can be of assistance to you before our next scheduled telephone appointment.  Our next appointment is by telephone on 12/06/24 at 11:00 AM  Following is a copy of your care plan:   Goals Addressed             This Visit's Progress    [facility]BCI Transitions of Care (TOC) Care Plan   No change    Problems: Readmitted 11/17/2024 to 11/19/2024 with COPD exacerbation with recent Influenza and Atrial Fibrillation: reviewed with patient Has been to ED x 2 since last week on going chest discomfort and back pain  Readmitted 11/11/24 through 11/13/24 [facility]) Care plan reactivated 11/14/24 Recent Hospitalization for treatment of COPD; Atrial Fib and Influenza A Knowledge Deficit Related to post hospital care COPD exacerbation, No Hospital Follow Up Provider appointment patient desires to make own appointments, and SDOH barrier: Patient currently declines any needs - 11/14/24 just waiting on DSS to reactivate Medicaid, currently getting rides to appointments 10/31/24 Patient states his transportation needs for appointments that he lost his Medicaid until he gets information back to DSS by 11/06/24 11/06/24 Patient got application sent to DSS today awaiting Medicaid re-enrollment. 11/14/24 Awaiting to hear from DSS, especially regarding restarting transportation 11/28/24 Has family planning Medicaid and needs to submit hospital bills by 12/05/24 to DSS Goal:  Over the next 30 days, the patient will not experience hospital readmission  Interventions:   COPD Interventions: Advised patient to engage in light exercise as tolerated 3-5 days a week to aid in the the management of COPD Advised patient to track and manage COPD triggers Advised patient to self assesses COPD action plan zone and make appointment with provider if in the yellow zone for 48 hours without improvement Assessed social determinant of  health barriers Discussed the importance of adequate rest and management of fatigue with COPD Provided education about and advised patient to utilize infection prevention strategies to reduce risk of respiratory infection Screening for signs and symptoms of depression related to chronic disease state  Use of home oxygen  prn, states usually at night since CPAP mask is broken 11/14/24 patient states he has not sent mask back but didn't like wearing CPAP as well. Transportation needs ongoing, encouraged to follow up with BSW for PCP appointment.  Patient Self Care Activities:  Attend all scheduled provider appointments Call pharmacy for medication refills 3-7 days in advance of running out of medications Call provider office for new concerns or questions  Notify RN Care Manager of [facility] call rescheduling needs Participate in Transition of Care Program/Attend TOC scheduled calls Take medications as prescribed   11/14/24 Reviewed new medications and patient verbalized already started today. [name] continues to assist with medication management as reviewed in AVS. 11/20/24 Patient medications reviewed and acknowledges changes see medication review. Patient encouraged to have BP cuff calibrated as he states EMS each time says his BP cuff continues to read much higher. Patient has been encouraged to have BP cuff calibrated or to obtain a new BP cuff. 11/28/24 Follow up with [facility] BSW, patient states he will ask his son and Joline friend to get to appointment if he has heard from DSS.  Plan:  The patient has been provided with contact information for the care management team and has been advised to call with any health related questions or concerns.   TOC Consent to Disclose Health Information (11/14/24 -  patient continues to agree)  Reviewed 11/20/24 and continues to agree to [place] as authorized for any follow up Following recent discharge, the patient has enrolled in the Transition of Care [facility]) Program and  requested that post-discharge communication be directed to a designated support person. The patient has consented for the [facility] team to share information with the individual(s) named below to support care coordination, medication review, and follow-up planning.  Authorized Individual(s): - Name:  Jaylen Knope - Relationship to Patient:  grandson - Contact Info: [phone] Patient verbalized understanding of the consent and confirmed their request for the [facility] team to communicate with the above-named individual(s) for the duration of the [facility] program enrollment. Patient was informed that this consent may be revoked at any time.   10/23/24 Discussed and offered 30 day TOC program.  Patient agrees to weekly calls in 30 day program.  The patient has been provided with contact information for the care management team and has been advised to call with any health -related questions or concerns.  The patient verbalized understanding with current plan of care.  The patient is directed to their insurance card regarding availability of benefits coverage.  Discussion with patient regarding follow up with counseling and states he's awaiting a call with referral made in November. Offered LCSW follow up and patient agrees, that might help till I get to the counselor.  Denies any immediate needs today. Will make referral. 10/31/24 Referral to BSW regarding transportation needs due to receiving notice of Medicaid application needed to restart benefits and upcoming appointment sand need for PCP appointment. 10/31/24 at 1416:  Contact PCP office regarding ongoing BP elevated and BP meds changed at last hospitalization. Spoke with Amera and Christine Triage at [facility] regarding BP concerns and appointment needs for hospital follow up issues due to transportation. 11/06/24 Continue to follow up with Medicaid and [name] to help patient get to appointment with Dr. Gasper on 11/10/24 and patient to have BP cuff  calibrated. Follow up for possible ulcer with stomach when eating.  11/14/24 Reactivated the 30 day TOC program for weekly calls. Reminded patient of [facility] Social Worker appointment to follow up today around 12 NOON, verbalized understanding. 11/20/24 Patient continues to desire weekly follow up, has PCP appointment for 11/24/24 and states he has Medicaid transportation for the appointment.  Patient reminded of BSW follow up appointment as well for 11/23/24 verbalized understanding of information given today. 11/20/24 Continue care plan  11/28/24 Ongoing follow up        The patient verbalized understanding of instructions, educational materials, and care plan provided today and DECLINED offer to receive copy of patient instructions, educational materials, and care plan.  Patient verbalized understanding as reviewed with patient.  The patient has been provided with contact information for the care management team and has been advised to call with any health related questions or concerns.   Please call the care guide team at [phone] if you need to cancel or reschedule your appointment.   Please call the Suicide and Crisis Lifeline: 988 call the [place]  National Suicide Prevention Lifeline: 919 421 5171 or TTY: (731) 109-1316 TTY 360 702 2670) to talk to a trained counselor call 1-800-273-TALK (toll free, 24 hour hotline) call 911 if you are experiencing a Mental Health or Behavioral Health Crisis or need someone to talk to.  Richerd Fish, RN, BSN, CCM [facility], [facility] Management Coordinator Direct Dial: [phone]

## 2024-11-28 NOTE — ED Notes (Signed)
 ED Provider at bedside.

## 2024-11-30 ENCOUNTER — Other Ambulatory Visit: Payer: Self-pay

## 2024-11-30 ENCOUNTER — Encounter: Payer: Self-pay | Admitting: *Deleted

## 2024-11-30 ENCOUNTER — Emergency Department

## 2024-11-30 ENCOUNTER — Emergency Department
Admission: EM | Admit: 2024-11-30 | Discharge: 2024-11-30 | Disposition: A | Discharge status: Home / Self Care | Attending: Emergency Medicine | Admitting: Emergency Medicine

## 2024-11-30 DIAGNOSIS — Z7901 Long term (current) use of anticoagulants: Secondary | ICD-10-CM | POA: Insufficient documentation

## 2024-11-30 DIAGNOSIS — R0789 Other chest pain: Secondary | ICD-10-CM | POA: Diagnosis present

## 2024-11-30 DIAGNOSIS — J449 Chronic obstructive pulmonary disease, unspecified: Secondary | ICD-10-CM | POA: Insufficient documentation

## 2024-11-30 DIAGNOSIS — R053 Chronic cough: Secondary | ICD-10-CM | POA: Diagnosis not present

## 2024-11-30 DIAGNOSIS — I5020 Unspecified systolic (congestive) heart failure: Secondary | ICD-10-CM | POA: Insufficient documentation

## 2024-11-30 DIAGNOSIS — E119 Type 2 diabetes mellitus without complications: Secondary | ICD-10-CM | POA: Diagnosis not present

## 2024-11-30 DIAGNOSIS — I110 Hypertensive heart disease with heart failure: Secondary | ICD-10-CM | POA: Diagnosis not present

## 2024-11-30 DIAGNOSIS — I2510 Atherosclerotic heart disease of native coronary artery without angina pectoris: Secondary | ICD-10-CM | POA: Diagnosis not present

## 2024-11-30 DIAGNOSIS — I4891 Unspecified atrial fibrillation: Secondary | ICD-10-CM | POA: Diagnosis not present

## 2024-11-30 LAB — CBC
HCT: 48.9 % (ref 39.0–52.0)
Hemoglobin: 15.8 g/dL (ref 13.0–17.0)
MCH: 30.4 pg (ref 26.0–34.0)
MCHC: 32.3 g/dL (ref 30.0–36.0)
MCV: 94 fL (ref 80.0–100.0)
Platelets: 177 K/uL (ref 150–400)
RBC: 5.2 MIL/uL (ref 4.22–5.81)
RDW: 15.2 % (ref 11.5–15.5)
WBC: 6.8 K/uL (ref 4.0–10.5)
nRBC: 0 % (ref 0.0–0.2)

## 2024-11-30 LAB — BASIC METABOLIC PANEL WITH GFR
Anion gap: 9 (ref 5–15)
BUN: 11 mg/dL (ref 8–23)
CO2: 29 mmol/L (ref 22–32)
Calcium: 9.6 mg/dL (ref 8.9–10.3)
Chloride: 105 mmol/L (ref 98–111)
Creatinine, Ser: 0.92 mg/dL (ref 0.61–1.24)
GFR, Estimated: >60 mL/min
Glucose, Bld: 101 mg/dL — ABNORMAL HIGH (ref 70–99)
Potassium: 4.4 mmol/L (ref 3.5–5.1)
Sodium: 142 mmol/L (ref 135–145)

## 2024-11-30 LAB — TROPONIN T, HIGH SENSITIVITY
Troponin T High Sensitivity: 26 ng/L — ABNORMAL HIGH (ref 0–19)
Troponin T High Sensitivity: 26 ng/L — ABNORMAL HIGH (ref 0–19)

## 2024-11-30 MED ORDER — METOPROLOL TARTRATE 5 MG/5ML IV SOLN
5.0000 mg | Freq: Once | INTRAVENOUS | Status: AC
  Administered 2024-11-30: 5 mg via INTRAVENOUS
  Filled 2024-11-30: qty 5

## 2024-11-30 NOTE — ED Notes (Signed)
 Patient declined receipt of discharge paperwork.

## 2024-11-30 NOTE — ED Triage Notes (Addendum)
 Pt brought in via ems from home.  Pt has chest pain. Pt got 3 ntg spray en route per pt.  No relief per pt.  Pt has nausea  pt alert  speech clear. Iv in place

## 2024-11-30 NOTE — ED Provider Triage Note (Signed)
 Emergency Medicine Provider Triage Evaluation Note  JAMARII BANKS , a 69 y.o. male  was evaluated in triage.  Pt complains of CP that began 3 hours ago. Pain is on the left side of the chest, worse with activity, radiates to the left shoulder. No pain with deep breaths.  Hx afib, unstable angina, CHF, CAD, diabetes, COPD  Review of Systems  Positive: CP, SOB, nausea, lightheaded, headache Negative: vomiting  Physical Exam  There were no vitals taken for this visit. Gen:   Awake, no distress   Resp:  Normal effort  MSK:   Moves extremities without difficulty  Other:    Medical Decision Making  Medically screening exam initiated at 5:35 PM.  Appropriate orders placed.  TRASHAWN OQUENDO was informed that the remainder of the evaluation will be completed by another provider, this initial triage assessment does not replace that evaluation, and the importance of remaining in the ED until their evaluation is complete.     Cleaster Tinnie LABOR, PA-C 11/30/24 1738

## 2024-11-30 NOTE — ED Triage Notes (Signed)
 First Nurse Note: Pt via [facility] from home. Ot c/o CP x 2 hours, L sided that radiates down the L arm. Pt has hx of a fib. EMS reports a fib on the monitor with hx, pt does take Eliquis . EMS gave pt 3 spray of Nitroglycerin  and 324 of ASA given. Pt is A&Ox4 and NAD EMS reports: 73 CBG  154/90 BP most recent 80-110 HR afib per EMS  98% on RA

## 2024-11-30 NOTE — ED Provider Notes (Signed)
 "  [facility] Provider Note    Event Date/Time   First MD Initiated Contact with Patient 11/30/24 2010     (approximate)   History   Chief Complaint Chest Pain   HPI  Lawrence [name] is a 69 y.o. male with past medical history of hypertension, diabetes, COPD on 2 L, atrial fibrillation on Eliquis , CAD, and HFrEF who presents to the ED complaining of chest pain.  Patient reports that he has been dealing with tightness in his chest and mild difficulty breathing starting this afternoon.  He has a chronic cough that is unchanged today, denies any fevers, pain or swelling in his legs.  He states he has been taking his medications as prescribed.     Physical Exam   Triage Vital Signs: ED Triage Vitals  Encounter Vitals Group     BP 11/30/24 1800 (!) 141/82     Girls Systolic BP Percentile --      Girls Diastolic BP Percentile --      Boys Systolic BP Percentile --      Boys Diastolic BP Percentile --      Pulse Rate 11/30/24 1800 85     Resp 11/30/24 1800 18     Temp 11/30/24 1800 97.9 F (36.6 C)     Temp Source 11/30/24 1800 Oral     SpO2 11/30/24 1800 95 %     Weight 11/30/24 1739 174 lb 2.6 oz (79 kg)     Height 11/30/24 1739 5' 7 (1.702 m)     Head Circumference --      Peak Flow --      Pain Score 11/30/24 1739 9     Pain Loc --      Pain Education --      Exclude from Growth Chart --     Most recent vital signs: Vitals:   11/30/24 1932 11/30/24 2126  BP: (!) 157/107 (!) 167/123  Pulse: 100 93  Resp: 18 16  Temp: 97.9 F (36.6 C)   SpO2: 93% 93%    Constitutional: Alert and oriented. Eyes: Conjunctivae are normal. Head: Atraumatic. Nose: No congestion/rhinnorhea. Mouth/Throat: Mucous membranes are moist.  Cardiovascular: Tachycardic, irregularly irregular rhythm. Grossly normal heart sounds.  2+ radial pulses bilaterally. Respiratory: Normal respiratory effort.  No retractions. Lungs CTAB.  No chest wall tenderness to palpation  noted. Gastrointestinal: Soft and nontender. No distention. Musculoskeletal: No lower extremity tenderness nor edema.  Neurologic:  Normal speech and language. No gross focal neurologic deficits are appreciated.    ED Results / Procedures / Treatments   Labs (all labs ordered are listed, but only abnormal results are displayed) Labs Reviewed  BASIC METABOLIC PANEL WITH GFR - Abnormal; Notable for the following components:      Result Value   Glucose, Bld 101 (*)    All other components within normal limits  TROPONIN T, HIGH SENSITIVITY - Abnormal; Notable for the following components:   Troponin T High Sensitivity 26 (*)    All other components within normal limits  TROPONIN T, HIGH SENSITIVITY - Abnormal; Notable for the following components:   Troponin T High Sensitivity 26 (*)    All other components within normal limits  CBC     EKG  ED ECG REPORT I, Carlin Palin, the attending physician, personally viewed and interpreted this ECG.   Date: 11/30/2024  EKG Time: 17:37  Rate: 117  Rhythm: atrial fibrillation  Axis: Normal  Intervals:none  ST&T Change: None  RADIOLOGY Chest x-ray reviewed and interpreted by me with no infiltrate, edema, or effusion.  PROCEDURES:  Critical Care performed: No  Procedures   MEDICATIONS ORDERED IN ED: Medications  metoprolol  tartrate (LOPRESSOR ) injection 5 mg (5 mg Intravenous Given 11/30/24 2127)     IMPRESSION / MDM / ASSESSMENT AND PLAN / ED COURSE  I reviewed the triage vital signs and the nursing notes.                              69 y.o. male with past medical history of hypertension, diabetes, COPD on 2 L, atrial fibrillation on Eliquis , CAD, and HF R EF who presents to the ED complaining of chest tightness and mild difficulty breathing over the past few hours.  Patient's presentation is most consistent with acute presentation with potential threat to life or bodily function.  Differential diagnosis includes, but  is not limited to, ACS, PE, arrhythmia, anemia, electrolyte abnormality, AKI, pneumonia, pneumothorax, musculoskeletal pain, GERD, anxiety.  Patient nontoxic-appearing and in no acute distress, vital signs remarkable for hypertension but otherwise reassuring.  EKG shows atrial fibrillation with mildly elevated heart rate at 117, no ischemic changes noted.  Symptoms seem atypical for ACS, troponin mildly elevated but stable on recheck and similar to prior baseline.  Patient does seem to have issues with chronic chest pain, has had multiple visits for similar symptoms already this month.  Chest x-ray is unremarkable and additional labs without significant anemia, leukocytosis, electrolyte abnormality, or AKI.  No significant wheezing noted on exam and he is breathing comfortably on his usual 2 L.  Plan to give rate control medication and reassess.  Heart rate significantly improved on reassessment, now running in the 90s with improvement in his blood pressure as well.  No evidence of hypertensive emergency and patient appropriate for discharge home with outpatient cardiology follow-up, referral provided.  He was counseled to return to the ED for new or worsening symptoms, patient agrees with plan.      FINAL CLINICAL IMPRESSION(S) / ED DIAGNOSES   Final diagnoses:  Atypical chest pain  Atrial fibrillation, unspecified type [facility])     Rx / DC Orders   ED Discharge Orders          Ordered    Ambulatory referral to Cardiology        11/30/24 2157             Note:  This document was prepared using Dragon voice recognition software and may include unintentional dictation errors.   Willo Dunnings, MD 11/30/24 2158  "

## 2024-12-01 ENCOUNTER — Inpatient Hospital Stay: Admitting: Family Medicine

## 2024-12-01 DIAGNOSIS — I200 Unstable angina: Secondary | ICD-10-CM

## 2024-12-01 DIAGNOSIS — D751 Secondary polycythemia: Secondary | ICD-10-CM

## 2024-12-01 DIAGNOSIS — D582 Other hemoglobinopathies: Secondary | ICD-10-CM

## 2024-12-01 DIAGNOSIS — E782 Mixed hyperlipidemia: Secondary | ICD-10-CM

## 2024-12-01 DIAGNOSIS — E119 Type 2 diabetes mellitus without complications: Secondary | ICD-10-CM

## 2024-12-01 DIAGNOSIS — I214 Non-ST elevation (NSTEMI) myocardial infarction: Secondary | ICD-10-CM

## 2024-12-01 DIAGNOSIS — I482 Chronic atrial fibrillation, unspecified: Secondary | ICD-10-CM

## 2024-12-01 DIAGNOSIS — F418 Other specified anxiety disorders: Secondary | ICD-10-CM

## 2024-12-01 DIAGNOSIS — G4733 Obstructive sleep apnea (adult) (pediatric): Secondary | ICD-10-CM

## 2024-12-01 DIAGNOSIS — F319 Bipolar disorder, unspecified: Secondary | ICD-10-CM

## 2024-12-01 DIAGNOSIS — K219 Gastro-esophageal reflux disease without esophagitis: Secondary | ICD-10-CM

## 2024-12-01 DIAGNOSIS — I952 Hypotension due to drugs: Secondary | ICD-10-CM

## 2024-12-01 DIAGNOSIS — E785 Hyperlipidemia, unspecified: Secondary | ICD-10-CM

## 2024-12-04 ENCOUNTER — Encounter: Payer: Self-pay | Admitting: Oncology

## 2024-12-04 ENCOUNTER — Telehealth: Payer: Self-pay

## 2024-12-04 NOTE — Telephone Encounter (Signed)
 That's fine

## 2024-12-04 NOTE — Telephone Encounter (Signed)
 Copied from CRM [id number]. Topic: Clinical - Home Health Verbal Orders >> Dec 04, 2024  9:58 AM Gustabo D wrote: Caller/Agency: Darroll Sacks with [facility] Callback Number: [phone]- secured vm Service Requested: MSW 2 additional Frequency: 1 week 2, 0 week 1, 1 week 1 Any new concerns about the patient? Yes lack of appropriate caregiver

## 2024-12-05 ENCOUNTER — Other Ambulatory Visit: Payer: Self-pay | Admitting: Family Medicine

## 2024-12-05 DIAGNOSIS — E1121 Type 2 diabetes mellitus with diabetic nephropathy: Secondary | ICD-10-CM

## 2024-12-06 ENCOUNTER — Telehealth: Payer: Self-pay

## 2024-12-06 ENCOUNTER — Other Ambulatory Visit: Payer: Self-pay

## 2024-12-06 NOTE — Transitions of Care (Post Inpatient/ED Visit) (Signed)
 " Transition of Care week 3  Visit Note  12/06/2024  Name: Lawrence [name] MRN: [id number]          DOB: 1956-11-05  Situation: Patient enrolled in [facility] 30-day program. Visit completed with patient by telephone.   Background:   Initial Transition Care Management Follow-up Telephone Call Discharge Date and Diagnosis: 11/19/24, COPD with AAcute Exacerbation   Past Medical History:  Diagnosis Date   A-fib (HCC)    Anemia    Anginal pain    Anxiety    Arthritis    Asthma    Atrial fibrillation, chronic (HCC) 10/19/2023   CAD (coronary artery disease)    a. 2002 CABGx2 (LIMA->LAD, VG->VG->OM1);  b. 09/2012 DES->OM;  c. 03/2015 PTCA of LAD [facility]) in setting of atretic LIMA; d. 05/2015 Cath [facility]): nonobs dzs; e. 06/2015 Cath ([facility]): LM nl, LAD 45p/d ISR, 50d, D1/2 small, LCX 50p/d ISR, OM1 70ost, 30 ISR, VG->OM1 50ost, 66m, LIMA->LAD 99p/d - atretic, RCA dom, nl; f.cath 10/16: 40-50%(FFR 0.90) pLAD, 75% (FFR 0.77) mLAD s/p PCI/DES, oRCA 40% (FFR0.95)   Cancer (HCC)    SKIN CANCER ON BACK   Celiac disease    Chronic diastolic CHF (congestive heart failure) (HCC)    a. 06/2009 Echo: EF 60-65%, Gr 1 DD, triv AI, mildly dil LA, nl RV.   COPD (chronic obstructive pulmonary disease) (HCC)    a. Chronic bronchitis and emphysema.   DDD (degenerative disc disease), lumbar    Diverticulosis    Dysrhythmia    Essential hypertension    GERD (gastroesophageal reflux disease)    History of hiatal hernia    History of kidney stones    H/O   History of tobacco abuse    a. Quit 2014.   Myocardial infarction [facility]) 2002   4 STENTS   Pancreatitis    PSVT (paroxysmal supraventricular tachycardia)    a. 10/2012 Noted on Zio Patch.   RLL pneumonia 05/30/2024   Sleep apnea    LOST CORD TO CPAP -ONLY 02 @ BEDTIME   Tubular adenoma of colon    Type II diabetes mellitus (HCC)     Assessment: Patient Reported Symptoms: Cognitive Cognitive Status: Able to follow simple commands, Alert and oriented to  person, place, and time, Normal speech and language skills      Neurological Neurological Review of Symptoms: Weakness Neurological Management Strategies: Medical device, Medication therapy, Routine screening Neurological Self-Management Outcome: 3 (uncertain)  HEENT HEENT Symptoms Reported: No symptoms reported, Nasal discharge, Tinnitus (Chronic issues per patient - for years) HEENT Management Strategies: Activity, Medication therapy, Routine screening HEENT Self-Management Outcome: 3 (uncertain)    Cardiovascular Cardiovascular Symptoms Reported: Chest pain or discomfort Does patient have uncontrolled Hypertension?: Yes Is patient checking Blood Pressure at home?: Yes Patient's Recent BP reading at home: 170/103 home monitor Cardiovascular Management Strategies: Medical device, Medication therapy, Routine screening Do You Have a Working Readable Scale?: Yes Weight: 174 lb (78.9 kg)  Respiratory Respiratory Symptoms Reported: Shortness of breath (only when up and about) Respiratory Management Strategies: Adequate rest, Oxygen  therapy, Medication therapy, Routine screening Respiratory Self-Management Outcome: 3 (uncertain)  Endocrine Endocrine Symptoms Reported: No symptoms reported Is patient diabetic?: Yes Is patient checking blood sugars at home?: Yes List most recent blood sugar readings, include date and time of day: 110 Endocrine Self-Management Outcome: 3 (uncertain)  Gastrointestinal Gastrointestinal Symptoms Reported: No symptoms reported Gastrointestinal Self-Management Outcome: 4 (good)    Genitourinary Genitourinary Symptoms Reported: Difficulty initiating stream Other Genitourinary Symptoms: ongoing, chronic  issues Genitourinary Self-Management Outcome: 3 (uncertain)  Integumentary Integumentary Symptoms Reported: No symptoms reported Skin Management Strategies: Routine screening Skin Self-Management Outcome: 4 (good)  Musculoskeletal Musculoskelatal Symptoms  Reviewed: Back pain, Unsteady gait, Weakness (They say that it's not going to get better that I am at my baseline and therapy won't even help) Additional Musculoskeletal Details: Ongoing chronic issues states pain medications just make it tolerable        Psychosocial Psychosocial Symptoms Reported: No symptoms reported Behavioral Management Strategies: Medication therapy, Counseling Behavioral Health Self-Management Outcome: 3 (uncertain)       Today's Vitals   12/06/24 1103  Weight: 174 lb (78.9 kg)   Pain Scale: 0-10 Pain Score: 4  Pain Type: Chronic pain Pain Location: Head Pain Descriptors / Indicators: Nagging Pain Onset: On-going  Medications Reviewed Today     Reviewed by Eilleen Richerd GRADE, RN (Registered Nurse) on 12/06/24 at 1113  Med List Status: <None>   Medication Order Taking? Sig Documenting Provider Last Dose Status Informant  Accu-Chek Softclix Lancets lancets [id number] Yes USE TO TEST BLOOD SUGAR DAILY AS DIRECTED [name], MD  Active   acetaminophen  (TYLENOL ) 325 MG tablet [id number] Yes Take 2 tablets (650 mg total) by mouth every 6 (six) hours as needed for mild pain (pain score 1-3) or fever (or Fever >/= 101). Dorinda Drue DASEN, MD  Active Self           Med Note VASHTI INOCENTE PARAS   Sat Nov 18, 2024  2:17 AM)    albuterol  (PROVENTIL ) (2.5 MG/3ML) 0.083% nebulizer solution [id number] Yes Take 3 mLs (2.5 mg total) by nebulization every 6 (six) hours as needed for wheezing or shortness of breath. [name], MD  Active Self           Med Note [name] Nov 22, 2024  8:45 PM)    allopurinol  (ZYLOPRIM ) 300 MG tablet [id number] Yes TAKE 1 TABLET BY MOUTH TWICE A DAY [name], MD  Active Self  ALPRAZolam  (XANAX ) 1 MG tablet [id number]  TAKE 1 TABLET BY MOUTH 3 TIMES A DAY AS NEEDED  Patient taking differently: Take 1 mg by mouth 3 (three) times daily.   [name], MD  Active Self           Med Note VALLERIE, LUCIE CHRISTELLA Stevan  Nov 22, 2024  8:57 PM)    apixaban  (ELIQUIS ) 5 MG TABS tablet [id number] Yes Take 1 tablet (5 mg total) by mouth 2 (two) times daily. [name], MD  Active Self  atorvastatin  (LIPITOR ) 80 MG tablet [id number] Yes TAKE 1 TABLET BY MOUTH AT BEDTIME [name], MD  Active Self  Blood Glucose Monitoring Suppl (ACCU-CHEK GUIDE) w/Device KIT [id number]  Use to check blood sugars as directed [name], MD  Active Self  Blood Pressure Monitor DEVI [id number]  Use to check blood pressure daily [name], MD  Active Self  BREZTRI  AEROSPHERE 160-9-4.8 MCG/ACT AERO inhaler [id number] Yes INHALE 2 PUFFS BY MOUTH INTO THE LUNGS 2TIMES DAILY [name], MD  Active Self  cetirizine  (ZYRTEC ) 10 MG tablet [id number] Yes TAKE 1 TABLET BY MOUTH AT BEDTIME [name], MD  Active Self  cholecalciferol  (VITAMIN D3) 25 MCG (1000 UNIT) tablet [id number] Yes Take 1,000 Units by mouth daily. [provider]  Active Self  Cyanocobalamin  (VITAMIN B-12) 1000 MCG SUBL [id number] Yes Place 1 tablet under the tongue daily. [provider]  Active Self  dapagliflozin  propanediol (FARXIGA ) 10 MG TABS tablet [id number] Yes TAKE 1 TABLET BY MOUTH DAILY [name], MD  Active Self  famotidine  (PEPCID ) 20 MG tablet [id number] Yes TAKE 1 TABLET BY MOUTH TWICE A DAY [name], MD  Active Self  furosemide  (LASIX ) 20 MG tablet [id number]  Take 20 mg by mouth as needed for fluid or edema. [provider]  Active Self  glucose blood (ACCU-CHEK GUIDE TEST) test strip [id number] Yes USE TO TEST BLOOD SUGAR DAILY AS DIRECTED [name], MD  Active   iron  polysaccharides (FERREX 150) 150 MG capsule [id number] Yes Take one capsul every Monday, Wednesday, and Friday [name], MD  Active Self  metFORMIN  (GLUCOPHAGE -XR) 500 MG 24 hr tablet [id number] Yes TAKE 1 TABLET BY MOUTH DAILY WITH EVENING MEAL [name], MD  Active Self  methocarbamol  (ROBAXIN ) 500 MG tablet  [id number] Yes TAKE 1 TABLET BY MOUTH EVERY 8 HOURS AS NEEDED FOR MUSCLE SPASMS [name], MD  Active Self           Med Note VALLERIE LUKES M   Wed Nov 22, 2024  8:56 PM)    metoprolol  succinate (TOPROL -XL) 50 MG 24 hr tablet [id number] Yes Take 1 tablet (50 mg total) by mouth daily. Take with or immediately following a meal. [name], MD  Active Self  nitroGLYCERIN  (NITROSTAT ) 0.4 MG SL tablet [id number]  Place 1 tablet (0.4 mg total) under the tongue every 5 (five) minutes as needed for chest pain (Do not take if blood pressure is low, systolic BP<110). [name], MD  Active Self           Med Note VALLERIE LUKES CHRISTELLA Stevan Nov 22, 2024  8:54 PM)    omega-3 acid ethyl esters (LOVAZA ) 1 g capsule [id number] Yes TAKE 4 CAPSULES (4 GRAMS TOTAL) BY WARDEN [name], MD  Active Self  oxyCODONE -acetaminophen  (PERCOCET) 10-325 MG tablet [id number]  TAKE 1 TABLET BY MOUTH EVERY 4 HOURS AS NEEDED FOR PAIN  Patient taking differently: Take 1 tablet by mouth every 4 (four) hours. for pain   [name], MD  Active Self           Med Note VALLERIE LUKES CHRISTELLA Stevan Nov 22, 2024  8:54 PM)    OXYGEN  [id number]  Inhale 2 L into the lungs at bedtime as needed (for shortness of breath). [provider]  Active Self  pantoprazole  (PROTONIX ) 40 MG tablet [id number]  TAKE 1 TABLET BY MOUTH 2 TIMES DAILY ( BEFORE A MEAL)  Patient taking differently: Take 40 mg by mouth daily before breakfast.   [name], MD  Active Self  ranolazine  (RANEXA ) 1000 MG SR tablet [id number]  Take 1,000 mg by mouth 2 (two) times daily.  Patient not taking: Reported on 12/06/2024   [provider]  Active Self  sacubitril -valsartan  (ENTRESTO ) 49-51 MG [id number]  TAKE 1 TABLET BY MOUTH TWICE A DAY [name], MD  Active Self  spironolactone  (ALDACTONE ) 25 MG tablet [id number]  Take 25 mg by mouth 2 (two) times daily.  Patient not taking: Reported on 12/06/2024    [provider]  Active Self           Med Note [name], [name] Nov 28, 2024  1:15 PM) To talk to Dr. Gasper on Friday  traZODone  (DESYREL ) 150 MG tablet [id number]  TAKE 1 TABLET  BY MOUTH AT BEDTIME AS NEEDED FOR SLEEP.  Patient taking differently: Take 150 mg by mouth at bedtime. for sleep   [name], MD  Active Self           Med Note [name]   Dju Nov 11, 2024  3:23 PM)              Recommendation:   Continue Current Plan of Care Patient requesting records for [facility] to [facility] to [facility] Patient has appointment with PCP and checking with PCP to see if this could be a virtual visit due to transportation needs.  Explored with patient about trying to see if his son can take him to appointment. Patient says he will check. Son, Nelwyn, states daughter in law does not drive.  Follow Up Plan:   Telephone follow-up in 1 week Referral to BSW ongoing follow up scheduled 12/08/24  Richerd Fish, RN, BSN, CCM [place]  [facility] RN Care Manager Direct Dial: [phone]           "

## 2024-12-08 ENCOUNTER — Other Ambulatory Visit: Payer: Self-pay | Admitting: Family Medicine

## 2024-12-08 ENCOUNTER — Inpatient Hospital Stay: Admitting: Family Medicine

## 2024-12-08 ENCOUNTER — Other Ambulatory Visit: Payer: Self-pay

## 2024-12-08 MED ORDER — ALPRAZOLAM 1 MG PO TABS: 1.0000 mg | ORAL_TABLET | Freq: Three times a day (TID) | ORAL | 3 refills | Status: AC | PRN

## 2024-12-08 NOTE — Telephone Encounter (Signed)
 Copied from CRM #[id number]. Topic: Clinical - Medication Refill >> Dec 08, 2024  9:42 AM Joesph B wrote: Medication:  oxyCODONE -acetaminophen  (PERCOCET) 10-325 MG tablet   ALPRAZolam  (XANAX ) 1 MG tablet    Has the patient contacted their pharmacy? Yes (Agent: If no, request that the patient contact the pharmacy for the refill. If patient does not wish to contact the pharmacy document the reason why and proceed with request.) (Agent: If yes, when and what did the pharmacy advise?)  This is the patient's preferred pharmacy:  [facility] - [place], [place] - [address] [place] [address] Phone: [phone] Fax: [phone]    Is this the correct pharmacy for this prescription? Yes If no, delete pharmacy and type the correct one.   Has the prescription been filled recently? Yes  Is the patient out of the medication? Yes  Has the patient been seen for an appointment in the last year OR does the patient have an upcoming appointment? Yes  Can we respond through MyChart? Yes  Agent: Please be advised that Rx refills may take up to 3 business days. We ask that you follow-up with your pharmacy.

## 2024-12-08 NOTE — Patient Outreach (Signed)
 Social Drivers of Health  Community Resource and Care Coordination Visit Note   12/08/2024  Name: Lawrence [name] MRN: [id number] DOB:1956-04-23  Situation: Referral received for [facility] needs assessment and assistance related to Transportation. I obtained verbal consent from Patient.  Visit completed with Patient on the phone.   Background:      Assessment:   Goals Addressed             This Visit's Progress    BSW Goal       Current SDOH Barriers:  Transportation  Interventions: Provided patient with information about [employer]. SW contacted [employer] [phone] to inquire if they covered [facility]. Lawrence [name] will contact SW back to inform if they cover [place] or just [employer]. Call received from Lawrence [name] and they do cover [name]. Patient is waiting for a decision form DSS on Medicaid application. Patient has Family planning Medicaid. SW emailed [employer] information to patients son. SW will follow up with patient next week to make sure transportation as able to talk him to his appointment.  Thersia Hoar, HEDWIG, MHA [place]  [facility] Social Worker, Population Health [phone]           Recommendation:   call for transportation assistance at least one week before appointments  Follow Up Plan:   Telephone follow-up 12/14/24 at 10am  Thersia Hoar, BSW, [facility]  [facility] Social Worker, Population Health [phone]

## 2024-12-08 NOTE — Patient Instructions (Signed)
 Visit Information  Thank you for taking time to visit with me today. Please don't hesitate to contact me if I can be of assistance to you before our next scheduled appointment.  Your next care management appointment is by telephone on 12/14/24 at 10am     Please call the care guide team at [phone] if you need to cancel, schedule, or reschedule an appointment.   Please call the Suicide and Crisis Lifeline: 988 call the [place]  National Suicide Prevention Lifeline: 786-043-2559 or TTY: (629)397-9820 TTY 862-315-3650) to talk to a trained counselor call 1-800-273-TALK (toll free, 24 hour hotline) call 911 if you are experiencing a Mental Health or Behavioral Health Crisis or need someone to talk to.  Thersia Hoar, HEDWIG, MHA [place]  [facility] Social Worker, Population Health [phone]

## 2024-12-08 NOTE — Telephone Encounter (Signed)
 Requested medication (s) are due for refill today - yes  Requested medication (s) are on the active medication list -yes  Future visit scheduled -yes  Last refill: Alprazolam - 09/16/24 #90 3RF                 Oxycodone - 11/10/24 #180  Notes to clinic: non delegated Rx  Requested Prescriptions  Pending Prescriptions Disp Refills   oxyCODONE -acetaminophen  (PERCOCET) 10-325 MG tablet 180 tablet 0    Sig: Take 1 tablet by mouth every 4 (four) hours as needed. for pain     Not Delegated - Analgesics:  Opioid Agonist Combinations Failed - 12/08/2024  1:01 PM      Failed - This refill cannot be delegated      Failed - Urine Drug Screen completed in last 360 days      Failed - Valid encounter within last 3 months    Recent Outpatient Visits           3 months ago Primary hypertension   [facility] [name], MD   4 months ago Chronic combined systolic (congestive) and diastolic (congestive) heart failure [facility])   [place] [facility] Issac Peyton BRAVO, RPH   5 months ago Bilious vomiting with nausea   [facility] [name], MD   6 months ago Pneumonia of right lower lobe due to infectious organism   [name], MD   6 months ago Pneumonia due to infectious organism, unspecified laterality, unspecified part of lung   [facility] [name], MD               ALPRAZolam  (XANAX ) 1 MG tablet 90 tablet 3    Sig: Take 1 tablet (1 mg total) by mouth 3 (three) times daily as needed.     Not Delegated - Psychiatry: Anxiolytics/Hypnotics 2 Failed - 12/08/2024  1:01 PM      Failed - This refill cannot be delegated      Failed - Urine Drug Screen completed in last 360 days      Passed - Patient is not pregnant      Passed - Valid encounter within last 6 months    Recent Outpatient Visits           3 months ago Primary  hypertension   [place] [name], MD   4 months ago Chronic combined systolic (congestive) and diastolic (congestive) heart failure [facility])   [place] [facility] Issac Peyton E, RPH   5 months ago Bilious vomiting with nausea   [facility] [name], MD   6 months ago Pneumonia of right lower lobe due to infectious organism   [facility] [name], MD   6 months ago Pneumonia due to infectious organism, unspecified laterality, unspecified part of lung    [facility] [name], MD                 Requested Prescriptions  Pending Prescriptions Disp Refills   oxyCODONE -acetaminophen  (PERCOCET) 10-325 MG tablet 180 tablet 0    Sig: Take 1 tablet by mouth every 4 (four) hours as needed. for pain     Not Delegated - Analgesics:  Opioid Agonist Combinations Failed - 12/08/2024  1:01 PM      Failed - This refill cannot be delegated  Failed - Urine Drug Screen completed in last 360 days      Failed - Valid encounter within last 3 months    Recent Outpatient Visits           3 months ago Primary hypertension   [place] [facility] [name], MD   4 months ago Chronic combined systolic (congestive) and diastolic (congestive) heart failure [facility])   [facility] Issac Peyton BRAVO, RPH   5 months ago Bilious vomiting with nausea   [facility] [name], MD   6 months ago Pneumonia of right lower lobe due to infectious organism   [facility] [name], MD   6 months ago Pneumonia due to infectious organism, unspecified laterality, unspecified part of lung   [facility] [name], MD               ALPRAZolam  (XANAX ) 1 MG tablet 90 tablet 3    Sig: Take 1 tablet (1 mg total)  by mouth 3 (three) times daily as needed.     Not Delegated - Psychiatry: Anxiolytics/Hypnotics 2 Failed - 12/08/2024  1:01 PM      Failed - This refill cannot be delegated      Failed - Urine Drug Screen completed in last 360 days      Passed - Patient is not pregnant      Passed - Valid encounter within last 6 months    Recent Outpatient Visits           3 months ago Primary hypertension   [place] [facility] [name], MD   4 months ago Chronic combined systolic (congestive) and diastolic (congestive) heart failure [facility])   [place] Issac Peyton BRAVO, RPH   5 months ago Bilious vomiting with nausea   [facility] [name], MD   6 months ago Pneumonia of right lower lobe due to infectious organism   [facility] [name], MD   6 months ago Pneumonia due to infectious organism, unspecified laterality, unspecified part of lung   [facility] [name], MD

## 2024-12-12 ENCOUNTER — Other Ambulatory Visit: Payer: Self-pay

## 2024-12-12 ENCOUNTER — Telehealth: Payer: Self-pay

## 2024-12-12 DIAGNOSIS — R531 Weakness: Secondary | ICD-10-CM

## 2024-12-12 DIAGNOSIS — I5043 Acute on chronic combined systolic (congestive) and diastolic (congestive) heart failure: Secondary | ICD-10-CM

## 2024-12-12 DIAGNOSIS — F418 Other specified anxiety disorders: Secondary | ICD-10-CM

## 2024-12-12 NOTE — Patient Instructions (Signed)
 Visit Information  Thank you for taking time to visit with me today. Please don't hesitate to contact me if I can be of assistance to you before our next scheduled telephone appointment.  Our next appointment is by telephone on 12/20/24 at 11:00 AM  Following is a copy of your care plan:   Goals Addressed             This Visit's Progress    [facility]I Transitions of Care [facility]) Care Plan   Improving    Problems: Readmitted 11/17/2024 to 11/19/2024 with COPD exacerbation with recent Influenza and Atrial Fibrillation: reviewed with patient Has been to ED x 2 since last week on going chest discomfort and back pain  Readmitted 11/11/24 through 11/13/24 [facility]) Care plan reactivated 11/14/24 Recent Hospitalization for treatment of COPD; Atrial Fib and Influenza A Knowledge Deficit Related to post hospital care COPD exacerbation, No Hospital Follow Up Provider appointment patient desires to make own appointments, and SDOH barrier: Patient currently declines any needs - 11/14/24 just waiting on DSS to reactivate Medicaid, currently getting rides to appointments 11/28/24 Has family planning Medicaid and needs to submit hospital bills by 12/05/24 to DSS 12/12/24 Ongoing assistance with transportation to appointments, Contacted Dr. Lyle office and spoke with Carlyon regarding patient need for appointment scheduled 12/13/24 at 10:00 Am to be changed to video if possible.  Verified it was changed. 12/12/24 Depression and inability to sleep well, states his grandson, [name], was asked to leave the apartment and this was a 1 BR place, they are looking for a 2 BR place with low rent so that [name] can continue to assist him and live with him. [name] continues to visit and bring food and things needed per patient.  Goal:  Over the next 30 days, the patient will not experience hospital readmission  Interventions:   COPD Interventions: Advised patient to engage in light exercise as tolerated 3-5 days a week to aid in the the  management of COPD Advised patient to track and manage COPD triggers Advised patient to self assesses COPD action plan zone and make appointment with provider if in the yellow zone for 48 hours without improvement Assessed social determinant of health barriers Discussed the importance of adequate rest and management of fatigue with COPD Provided education about and advised patient to utilize infection prevention strategies to reduce risk of respiratory infection Screening for signs and symptoms of depression related to chronic disease state  Use of home oxygen  prn,  12/06/24 Transportation needs ongoing, encouraged to follow up with BSW for PCP appointment.  Patient states he has [facility] and he has been trying to get [facility] to send information for his hospitalizations for DSS to get approval for Medicaid for transportation. FP Medicaid does not cover transportation. Follow up with BSW Alexis for continued follow up and assistance. 12/12/24 Patient to send DSS the hospital bills to assist with decision to get full Medicaid - patient has made request from hospital and awaiting mailing 12/12/24 Referral made to LCSW for depression follow up per patient request for increase stress with grandson asked to move out.  Patient Self Care Activities:  Attend all scheduled provider appointments Call pharmacy for medication refills 3-7 days in advance of running out of medications Call provider office for new concerns or questions  Notify RN Care Manager of [facility] call rescheduling needs Participate in Transition of Care Program/Attend TOC scheduled calls Take medications as prescribed   11/14/24 Reviewed new medications and patient verbalized already  started today. [name] continues to assist with medication management as reviewed in AVS. 11/20/24 Patient medications reviewed and acknowledges changes see medication review. Patient encouraged to have BP cuff calibrated as he states EMS each time says his  BP cuff continues to read much higher. Patient has been encouraged to have BP cuff calibrated or to obtain a new BP cuff. 11/28/24 Follow up with VBCI BSW, patient states he will ask his son and Joline friend to get to appointment if he has heard from DSS.  Plan:  The patient has been provided with contact information for the care management team and has been advised to call with any health related questions or concerns.   TOC Consent to Disclose Health Information (11/14/24 - patient continues to agree)  Reviewed 11/20/24 and continues to agree to [place] as authorized for any follow up Following recent discharge, the patient has enrolled in the Transition of Care [facility]) Program and requested that post-discharge communication be directed to a designated support person. The patient has consented for the [facility] team to share information with the individual(s) named below to support care coordination, medication review, and follow-up planning.  Authorized Individual(s): - Name:  Yochanan Eddleman - Relationship to Patient:  grandson - Contact Info: [phone] Patient verbalized understanding of the consent and confirmed their request for the [facility] team to communicate with the above-named individual(s) for the duration of the [facility] program enrollment. Patient was informed that this consent may be revoked at any time.    11/14/24 Reactivated the 30 day TOC program for weekly calls. Reminded patient of VBCI Social Worker appointment to follow up today around 12 NOON, verbalized understanding. 11/20/24 Patient continues to desire weekly follow up, has PCP appointment for 11/24/24 and states he has Medicaid transportation for the appointment.  Patient reminded of BSW follow up appointment as well for 11/23/24 verbalized understanding of information given today. 1 11/28/24 Ongoing follow up with BSW for transportation is Medicaid needs. Patient needs more information for Medicaid, he is contacting his case worker. 12/06/24 Continue  to follow up for chronic issues, patient has PCP appointment 12/08/24 will reach out to PCP via in basket to alert on follow up needs and to see if a virtual visit would be appropriate. Patient has follow up with BSW also on 12/08/24. Continue to follow. 12/12/24 PCP follow up changed to video appt for 12/13/24 and follow up with this writer next week 12/20/24 at 11:00 am.        The patient verbalized understanding of instructions, educational materials, and care plan provided today and DECLINED offer to receive copy of patient instructions, educational materials, and care plan.   The patient has been provided with contact information for the care management team and has been advised to call with any health related questions or concerns.  Follow up with provider re: changing patient's appointment call tomorrow and spoke with Carlyon at [facility] regarding patient having difficulty making in person appointments due to transportation with Medicaid/[employer] Next PCP appointment scheduled for:  12/13/24 was changed to video appointment, notified to be ready for call 10- 15 minutes prior to 10:00 AM. Patient verbalized understanding a appreciation.   Please call the care guide team at [phone] if you need to cancel or reschedule your appointment.   Please call the Suicide and Crisis Lifeline: 988 call the [place]  National Suicide Prevention Lifeline: (463)747-9212 or TTY: 214 127 2548 TTY 343 698 5956) to talk to a trained counselor call 1-800-273-TALK (toll free, 24 hour hotline) go to  [facility] [address] [place] [phone]) call 911 if you are experiencing a Mental Health or Behavioral Health Crisis or need someone to talk to.  Richerd Fish, RN, BSN, CCM [facility], [facility] Health RN Care Manager Direct Dial: [phone]

## 2024-12-12 NOTE — Transitions of Care (Post Inpatient/ED Visit) (Signed)
 " Transition of Care week 4  Visit Note  12/12/2024  Name: Lawrence [name] MRN: [id number]          DOB: [birth year]  Situation: Patient enrolled in [facility] 30-day program. Visit completed with patient by telephone.   Background:   Initial Transition Care Management Follow-up Telephone Call Discharge Date and Diagnosis: 11/19/24, COPD with AAcute Exacerbation   Past Medical History:  Diagnosis Date   A-fib (HCC)    Anemia    Anginal pain    Anxiety    Arthritis    Asthma    Atrial fibrillation, chronic (HCC) 10/19/2023   CAD (coronary artery disease)    a. 2002 CABGx2 (LIMA->LAD, VG->VG->OM1);  b. 09/2012 DES->OM;  c. 03/2015 PTCA of LAD [facility]) in setting of atretic LIMA; d. 05/2015 [facility]): nonobs dzs; e. 06/2015 Cath ([facility]): LM nl, LAD 45p/d ISR, 50d, D1/2 small, LCX 50p/d ISR, OM1 70ost, 30 ISR, VG->OM1 50ost, 37m, LIMA->LAD 99p/d - atretic, RCA dom, nl; f.cath 10/16: 40-50%(FFR 0.90) pLAD, 75% (FFR 0.77) mLAD s/p PCI/DES, oRCA 40% (FFR0.95)   Cancer (HCC)    SKIN CANCER ON BACK   Celiac disease    Chronic diastolic CHF (congestive heart failure) (HCC)    a. 06/2009 Echo: EF 60-65%, Gr 1 DD, triv AI, mildly dil LA, nl RV.   COPD (chronic obstructive pulmonary disease) (HCC)    a. Chronic bronchitis and emphysema.   DDD (degenerative disc disease), lumbar    Diverticulosis    Dysrhythmia    Essential hypertension    GERD (gastroesophageal reflux disease)    History of hiatal hernia    History of kidney stones    H/O   History of tobacco abuse    a. Quit 2014.   Myocardial infarction [facility]) 2002   4 STENTS   Pancreatitis    PSVT (paroxysmal supraventricular tachycardia)    a. 10/2012 Noted on Zio Patch.   RLL pneumonia 05/30/2024   Sleep apnea    LOST CORD TO CPAP -ONLY 02 @ BEDTIME   Tubular adenoma of colon    Type II diabetes mellitus (HCC)     Assessment: Patient Reported Symptoms: Cognitive Cognitive Status: Able to follow simple commands, Alert and oriented to  person, place, and time, Normal speech and language skills      Neurological Neurological Review of Symptoms: Weakness Neurological Management Strategies: Medical device, Medication therapy, Routine screening Neurological Self-Management Outcome: 3 (uncertain) Neurological Comment: chronic pain  HEENT HEENT Symptoms Reported: No symptoms reported, Nasal discharge HEENT Management Strategies: Activity, Medication therapy, Routine screening HEENT Self-Management Outcome: 3 (uncertain)    Cardiovascular Cardiovascular Symptoms Reported: Chest pain or discomfort Does patient have uncontrolled Hypertension?: Yes Is patient checking Blood Pressure at home?: Yes Patient's Recent BP reading at home: 178/118 before medication then 147/98 after pain medication and meds Cardiovascular Management Strategies: Medical device, Medication therapy, Routine screening Do You Have a Working Readable Scale?:  (Grandson packed it up when they thought they were leaving) Cardiovascular Self-Management Outcome: 3 (uncertain)  Respiratory Respiratory Symptoms Reported: Shortness of breath Other Respiratory Symptoms: with ambulation Respiratory Management Strategies: Adequate rest, Oxygen  therapy, Routine screening, Medication therapy Respiratory Self-Management Outcome: 3 (uncertain)  Endocrine Endocrine Symptoms Reported: No symptoms reported Is patient diabetic?: Yes Is patient checking blood sugars at home?: Yes List most recent blood sugar readings, include date and time of day: 113 Endocrine Self-Management Outcome: 4 (good)  Gastrointestinal Gastrointestinal Symptoms Reported: Abdominal pain or discomfort Additional Gastrointestinal Details: LBM 12/11/24 -  have a bulge near umbilcus Gastrointestinal Self-Management Outcome: 4 (good)    Genitourinary Genitourinary Symptoms Reported: Difficulty initiating stream Other Genitourinary Symptoms: chronic issues Genitourinary Self-Management Outcome: 4 (good)   Integumentary Integumentary Symptoms Reported: No symptoms reported Skin Management Strategies: Routine screening Skin Self-Management Outcome: 4 (good)  Musculoskeletal Musculoskelatal Symptoms Reviewed: Back pain, Unsteady gait, Weakness Additional Musculoskeletal Details: chronic Musculoskeletal Management Strategies: Activity, Adequate rest, Fluid modification, Medication therapy, Routine screening Musculoskeletal Self-Management Outcome: 3 (uncertain)      Psychosocial Psychosocial Symptoms Reported: Depression - if selected complete PHQ 2-9         Today's Vitals   12/12/24 1403  BP: (!) 147/98  Pulse: 80   Pain Scale: 0-10 Pain Score: 7  (Just took Oxycodone ) Pain Type: Chronic pain  Medications Reviewed Today     Reviewed by Eilleen Richerd GRADE, RN (Registered Nurse) on 12/12/24 at 1504  Med List Status: <None>   Medication Order Taking? Sig Documenting Provider Last Dose Status Informant  Accu-Chek Softclix Lancets lancets [id number] Yes USE TO TEST BLOOD SUGAR DAILY AS DIRECTED [name], MD  Active   acetaminophen  (TYLENOL ) 325 MG tablet [id number] Yes Take 2 tablets (650 mg total) by mouth every 6 (six) hours as needed for mild pain (pain score 1-3) or fever (or Fever >/= 101). Dorinda Drue DASEN, MD  Active Self           Med Note VASHTI INOCENTE PARAS   Sat Nov 18, 2024  2:17 AM)    albuterol  (PROVENTIL ) (2.5 MG/3ML) 0.083% nebulizer solution [id number] Yes Take 3 mLs (2.5 mg total) by nebulization every 6 (six) hours as needed for wheezing or shortness of breath. [name], MD  Active Self           Med Note [name] Nov 22, 2024  8:45 PM)    allopurinol  (ZYLOPRIM ) 300 MG tablet [id number] Yes TAKE 1 TABLET BY MOUTH TWICE A DAY [name], MD  Active Self  ALPRAZolam  (XANAX ) 1 MG tablet [id number] Yes Take 1 tablet (1 mg total) by mouth 3 (three) times daily as needed. [name], MD  Active   apixaban  (ELIQUIS ) 5 MG TABS tablet [id number]  Yes Take 1 tablet (5 mg total) by mouth 2 (two) times daily. [name], MD  Active Self  atorvastatin  (LIPITOR ) 80 MG tablet [id number] Yes TAKE 1 TABLET BY MOUTH AT BEDTIME [name], MD  Active Self  Blood Glucose Monitoring Suppl (ACCU-CHEK GUIDE) w/Device KIT [id number] Yes Use to check blood sugars as directed [name], MD  Active Self  Blood Pressure Monitor DEVI [id number] Yes Use to check blood pressure daily [name], MD  Active Self  BREZTRI  AEROSPHERE 160-9-4.8 MCG/ACT AERO inhaler [id number] Yes INHALE 2 PUFFS BY MOUTH INTO THE LUNGS 2TIMES DAILY [name], MD  Active Self  cetirizine  (ZYRTEC ) 10 MG tablet [id number] Yes TAKE 1 TABLET BY MOUTH AT BEDTIME [name], MD  Active Self  cholecalciferol  (VITAMIN D3) 25 MCG (1000 UNIT) tablet [id number] Yes Take 1,000 Units by mouth daily. [provider]  Active Self  Cyanocobalamin  (VITAMIN B-12) 1000 MCG SUBL [id number] Yes Place 1 tablet under the tongue daily. [provider]  Active Self  dapagliflozin  propanediol (FARXIGA ) 10 MG TABS tablet [id number] Yes TAKE 1 TABLET BY MOUTH DAILY [name], MD  Active Self  famotidine  (PEPCID ) 20 MG tablet [id number] Yes TAKE 1 TABLET BY MOUTH TWICE  A DAY [name], MD  Active Self  furosemide  (LASIX ) 20 MG tablet [id number] Yes Take 20 mg by mouth as needed for fluid or edema. [provider]  Active Self  glucose blood (ACCU-CHEK GUIDE TEST) test strip [id number]  USE TO TEST BLOOD SUGAR DAILY AS DIRECTED [name], MD  Active   iron  polysaccharides (FERREX 150) 150 MG capsule [id number] Yes Take one capsul every Monday, Wednesday, and Friday [name], MD  Active Self  metFORMIN  (GLUCOPHAGE -XR) 500 MG 24 hr tablet [id number] Yes TAKE 1 TABLET BY MOUTH DAILY WITH EVENING MEAL [name], MD  Active Self  methocarbamol  (ROBAXIN ) 500 MG tablet [id number] Yes TAKE 1 TABLET BY MOUTH EVERY 8 HOURS AS NEEDED FOR  MUSCLE SPASMS [name], MD  Active Self           Med Note VALLERIE LUKES M   Wed Nov 22, 2024  8:56 PM)    metoprolol  succinate (TOPROL -XL) 50 MG 24 hr tablet [id number] Yes Take 1 tablet (50 mg total) by mouth daily. Take with or immediately following a meal. [name], MD  Active Self  nitroGLYCERIN  (NITROSTAT ) 0.4 MG SL tablet [id number]  Place 1 tablet (0.4 mg total) under the tongue every 5 (five) minutes as needed for chest pain (Do not take if blood pressure is low, systolic BP<110). [name], MD  Active Self           Med Note VALLERIE LUKES CHRISTELLA Stevan Nov 22, 2024  8:54 PM)    omega-3 acid ethyl esters (LOVAZA ) 1 g capsule [id number] Yes TAKE 4 CAPSULES (4 GRAMS TOTAL) BY WARDEN [name], MD  Active Self  oxyCODONE -acetaminophen  (PERCOCET) 10-325 MG tablet [id number] Yes TAKE 1 TABLET BY MOUTH EVERY 4 HOURS AS NEEDED FOR PAIN [name], MD  Active   OXYGEN  [id number] Yes Inhale 2 L into the lungs at bedtime as needed (for shortness of breath). [provider]  Active Self  pantoprazole  (PROTONIX ) 40 MG tablet [id number]  TAKE 1 TABLET BY MOUTH 2 TIMES DAILY ( BEFORE A MEAL)  Patient taking differently: Take 40 mg by mouth daily before breakfast.   [name], MD  Active Self  ranolazine  (RANEXA ) 1000 MG SR tablet [id number]  Take 1,000 mg by mouth 2 (two) times daily.  Patient not taking: Reported on 12/06/2024   [provider]  Active Self  sacubitril -valsartan  (ENTRESTO ) 49-51 MG [id number] Yes TAKE 1 TABLET BY MOUTH TWICE A DAY [name], MD  Active Self  spironolactone  (ALDACTONE ) 25 MG tablet [id number]  Take 25 mg by mouth 2 (two) times daily.  Patient not taking: Reported on 12/06/2024   [provider]  Active Self           Med Note [name], [name] Nov 28, 2024  1:15 PM) To talk to Dr. Gasper on Friday  traZODone  (DESYREL ) 150 MG tablet [id number]  TAKE 1 TABLET BY MOUTH AT BEDTIME AS  NEEDED FOR SLEEP.  Patient taking differently: Take 150 mg by mouth at bedtime. for sleep   [name], MD  Active Self           Med Note [name]   Dju Nov 11, 2024  3:23 PM)              Recommendation:   PCP Follow-up Continue Current Plan of Care  Follow Up Plan:   Telephone  follow-up in 1 week  Richerd Fish, RN, BSN, CCM [facility], [facility] Health RN Care Manager Direct Dial: [phone]           "

## 2024-12-13 ENCOUNTER — Inpatient Hospital Stay: Admitting: Family Medicine

## 2024-12-13 DIAGNOSIS — J441 Chronic obstructive pulmonary disease with (acute) exacerbation: Secondary | ICD-10-CM

## 2024-12-13 DIAGNOSIS — I482 Chronic atrial fibrillation, unspecified: Secondary | ICD-10-CM

## 2024-12-13 DIAGNOSIS — Z7901 Long term (current) use of anticoagulants: Secondary | ICD-10-CM

## 2024-12-13 DIAGNOSIS — Z955 Presence of coronary angioplasty implant and graft: Secondary | ICD-10-CM

## 2024-12-13 DIAGNOSIS — Z951 Presence of aortocoronary bypass graft: Secondary | ICD-10-CM

## 2024-12-13 DIAGNOSIS — J439 Emphysema, unspecified: Secondary | ICD-10-CM

## 2024-12-13 DIAGNOSIS — I214 Non-ST elevation (NSTEMI) myocardial infarction: Secondary | ICD-10-CM

## 2024-12-13 DIAGNOSIS — D751 Secondary polycythemia: Secondary | ICD-10-CM

## 2024-12-13 DIAGNOSIS — F418 Other specified anxiety disorders: Secondary | ICD-10-CM

## 2024-12-13 DIAGNOSIS — Z87891 Personal history of nicotine dependence: Secondary | ICD-10-CM

## 2024-12-13 DIAGNOSIS — I952 Hypotension due to drugs: Secondary | ICD-10-CM

## 2024-12-13 DIAGNOSIS — S0990XD Unspecified injury of head, subsequent encounter: Secondary | ICD-10-CM

## 2024-12-13 DIAGNOSIS — G4733 Obstructive sleep apnea (adult) (pediatric): Secondary | ICD-10-CM

## 2024-12-13 DIAGNOSIS — E1151 Type 2 diabetes mellitus with diabetic peripheral angiopathy without gangrene: Secondary | ICD-10-CM

## 2024-12-13 DIAGNOSIS — J9611 Chronic respiratory failure with hypoxia: Secondary | ICD-10-CM

## 2024-12-13 DIAGNOSIS — E782 Mixed hyperlipidemia: Secondary | ICD-10-CM

## 2024-12-13 DIAGNOSIS — I5042 Chronic combined systolic (congestive) and diastolic (congestive) heart failure: Secondary | ICD-10-CM

## 2024-12-13 DIAGNOSIS — E119 Type 2 diabetes mellitus without complications: Secondary | ICD-10-CM

## 2024-12-13 DIAGNOSIS — G8929 Other chronic pain: Secondary | ICD-10-CM

## 2024-12-13 DIAGNOSIS — F32A Depression, unspecified: Secondary | ICD-10-CM

## 2024-12-14 ENCOUNTER — Other Ambulatory Visit: Payer: Self-pay

## 2024-12-14 ENCOUNTER — Other Ambulatory Visit: Payer: Self-pay | Admitting: Family Medicine

## 2024-12-14 NOTE — Patient Outreach (Signed)
 [employer] [facility] [employer]  Community Resource and Care Coordination Visit Note   12/14/2024  Name: Lawrence [name] MRN: [id number] DOB:Mar 21, 1956  Situation: Referral received for [facility] needs assessment and assistance related to Transportation. I obtained verbal consent from Patient.  Visit completed with Patient on the phone.   Background:      Assessment:   Goals Addressed             This Visit's Progress    COMPLETED: BSW Goal       Current SDOH Barriers:  Transportation  Interventions: Provided patient with information about [employer]. SW contacted [employer] [phone] to inquire if they covered [facility]. [name] will contact SW back to inform if they cover [place] or just [employer]. Call received from Kevin and they do cover [facility]. Patient is waiting for a decision form DSS on Medicaid application. Patient has Family planning Medicaid. SW emailed [employer] information to patients son. SW will follow up with patient next week to make sure transportation as able to talk him to his appointment.  Thersia Hoar, BSW, MHA [place]  [facility] Social Worker, Population Health [phone]           Recommendation:   Contact transportation for appointments when needed.   Follow Up Plan:   Patient has achieved all patient stated goals. [employer] will be closed. Patient has been provided contact information should new needs arise.    Thersia Hoar, HEDWIG, MHA [place]  [facility] Social Worker, Population Health [phone]

## 2024-12-14 NOTE — Patient Instructions (Signed)
 Visit Information  Thank you for taking time to visit with me today. Please don't hesitate to contact me if I can be of assistance to you before our next scheduled appointment.  Your next care management appointment is no further scheduled appointments.      Please call the care guide team at [phone] if you need to cancel, schedule, or reschedule an appointment.   Please call the Suicide and Crisis Lifeline: 988 call the [place]  National Suicide Prevention Lifeline: (205) 661-1128 or TTY: 7576086345 TTY 248-735-9102) to talk to a trained counselor call 1-800-273-TALK (toll free, 24 hour hotline) call 911 if you are experiencing a Mental Health or Behavioral Health Crisis or need someone to talk to.  Valora Gear, Florestine Hurl, MHA [place]  [facility] Social Worker, Population Health [phone]

## 2024-12-15 ENCOUNTER — Other Ambulatory Visit: Payer: Self-pay

## 2024-12-15 DIAGNOSIS — R197 Diarrhea, unspecified: Secondary | ICD-10-CM | POA: Diagnosis present

## 2024-12-15 DIAGNOSIS — R195 Other fecal abnormalities: Secondary | ICD-10-CM | POA: Insufficient documentation

## 2024-12-15 LAB — URINALYSIS, ROUTINE W REFLEX MICROSCOPIC
Bilirubin Urine: NEGATIVE
Glucose, UA: >=500 mg/dL — AB
Hgb urine dipstick: NEGATIVE
Ketones, ur: 20 mg/dL — AB
Leukocytes,Ua: NEGATIVE
Nitrite: NEGATIVE
Protein, ur: NEGATIVE mg/dL
Specific Gravity, Urine: 1.029 (ref 1.005–1.030)
Squamous Epithelial / HPF: 0 /HPF (ref 0–5)
pH: 5 (ref 5.0–8.0)

## 2024-12-15 LAB — COMPREHENSIVE METABOLIC PANEL WITH GFR
ALT: 9 U/L (ref 0–44)
AST: 16 U/L (ref 15–41)
Albumin: 3.9 g/dL (ref 3.5–5.0)
Alkaline Phosphatase: 62 U/L (ref 38–126)
Anion gap: 11 (ref 5–15)
BUN: 11 mg/dL (ref 8–23)
CO2: 29 mmol/L (ref 22–32)
Calcium: 9.8 mg/dL (ref 8.9–10.3)
Chloride: 104 mmol/L (ref 98–111)
Creatinine, Ser: 0.96 mg/dL (ref 0.61–1.24)
GFR, Estimated: >60 mL/min
Glucose, Bld: 100 mg/dL — ABNORMAL HIGH (ref 70–99)
Potassium: 4.2 mmol/L (ref 3.5–5.1)
Sodium: 144 mmol/L (ref 135–145)
Total Bilirubin: 0.9 mg/dL (ref 0.0–1.2)
Total Protein: 6.2 g/dL — ABNORMAL LOW (ref 6.5–8.1)

## 2024-12-15 LAB — CBC
HCT: 50.9 % (ref 39.0–52.0)
Hemoglobin: 16.6 g/dL (ref 13.0–17.0)
MCH: 31 pg (ref 26.0–34.0)
MCHC: 32.6 g/dL (ref 30.0–36.0)
MCV: 95 fL (ref 80.0–100.0)
Platelets: 147 10*3/uL — ABNORMAL LOW (ref 150–400)
RBC: 5.36 MIL/uL (ref 4.22–5.81)
RDW: 14.8 % (ref 11.5–15.5)
WBC: 5.3 10*3/uL (ref 4.0–10.5)
nRBC: 0 % (ref 0.0–0.2)

## 2024-12-15 LAB — LIPASE, BLOOD: Lipase: 44 U/L (ref 11–51)

## 2024-12-15 NOTE — ED Triage Notes (Signed)
 BIB [facility] from home. Reports diarrhea x 5 days that is black. 1 episode emesis. Recent dx aortic aneurysm. Denies daily drinking. Pt alert and oriented on arrival. Breathing unlabored speaking in full sentences with symmetric chest rise and fall   EMS VS: 163/100 HR 88 98% RA

## 2024-12-16 ENCOUNTER — Emergency Department
Admission: EM | Admit: 2024-12-16 | Discharge: 2024-12-16 | Disposition: A | Attending: Emergency Medicine | Admitting: Emergency Medicine

## 2024-12-16 DIAGNOSIS — R195 Other fecal abnormalities: Secondary | ICD-10-CM

## 2024-12-16 DIAGNOSIS — R197 Diarrhea, unspecified: Secondary | ICD-10-CM

## 2024-12-16 MED ORDER — ONDANSETRON HCL 4 MG/2ML IJ SOLN
4.0000 mg | Freq: Once | INTRAMUSCULAR | Status: AC
  Administered 2024-12-16: 4 mg via INTRAVENOUS
  Filled 2024-12-16: qty 2

## 2024-12-16 MED ORDER — ONDANSETRON 4 MG PO TBDP: 4.0000 mg | ORAL_TABLET | Freq: Three times a day (TID) | ORAL | 0 refills | Status: AC | PRN

## 2024-12-16 MED ORDER — OXYCODONE HCL 5 MG PO TABS
10.0000 mg | ORAL_TABLET | Freq: Once | ORAL | Status: AC
  Administered 2024-12-16: 10 mg via ORAL
  Filled 2024-12-16: qty 2

## 2024-12-16 NOTE — ED Notes (Signed)
 Fall risk bundle is currently in place.

## 2024-12-16 NOTE — ED Provider Notes (Signed)
 "  [facility] Provider Note    Event Date/Time   First MD Initiated Contact with Patient 12/16/24 [phone]     (approximate)   History   Diarrhea   HPI  Lawrence [name] is a 69 y.o. male   Past medical history of Multiple medical comorbidities but today dealing with diarrheal illness loose black stools for the last 5 days.  On a blood thinner.  Crampy diffuse abdominal pain on and off and none currently.    No trauma.    No vomiting.  No urinary symptoms.     External Medical Documents Reviewed: Prior hospital notes      Physical Exam   Triage Vital Signs: ED Triage Vitals  Encounter Vitals Group     BP 12/15/24 2319 (!) 172/133     Girls Systolic BP Percentile --      Girls Diastolic BP Percentile --      Boys Systolic BP Percentile --      Boys Diastolic BP Percentile --      Pulse Rate 12/15/24 2319 98     Resp 12/15/24 2319 18     Temp 12/15/24 2319 98.4 F (36.9 C)     Temp Source 12/15/24 2319 Oral     SpO2 12/15/24 2319 96 %     Weight 12/15/24 2317 173 lb 15.1 oz (78.9 kg)     Height 12/15/24 2317 5' 7 (1.702 m)     Head Circumference --      Peak Flow --      Pain Score 12/15/24 2317 7     Pain Loc --      Pain Education --      Exclude from Growth Chart --     Most recent vital signs: Vitals:   12/15/24 2319 12/16/24 0337  BP: (!) 172/133 (!) 172/118  Pulse: 98 94  Resp: 18 17  Temp: 98.4 F (36.9 C) 97.6 F (36.4 C)  SpO2: 96% 98%    General: Awake, no distress.  CV:  Good peripheral perfusion.  Resp:  Normal effort.  Abd:  No distention.  Other:  Nonfocal nonperitoneal abdominal exam.  Comfortable appearing hypertensive otherwise vital signs normal.  Rectal vault was mostly empty with a small amount of brown stool that I obtained was guaiac negative.   ED Results / Procedures / Treatments   Labs (all labs ordered are listed, but only abnormal results are displayed) Labs Reviewed  COMPREHENSIVE METABOLIC  PANEL WITH GFR - Abnormal; Notable for the following components:      Result Value   Glucose, Bld 100 (*)    Total Protein 6.2 (*)    All other components within normal limits  CBC - Abnormal; Notable for the following components:   Platelets 147 (*)    All other components within normal limits  URINALYSIS, ROUTINE W REFLEX MICROSCOPIC - Abnormal; Notable for the following components:   Color, Urine YELLOW (*)    APPearance CLEAR (*)    Glucose, UA >=500 (*)    Ketones, ur 20 (*)    Bacteria, UA RARE (*)    All other components within normal limits  LIPASE, BLOOD     I ordered and reviewed the above labs they are notable for cell count and electrolytes unremarkable.   PROCEDURES:  Critical Care performed: No  Procedures   MEDICATIONS ORDERED IN ED: Medications  ondansetron  (ZOFRAN ) injection 4 mg (4 mg Intravenous Given 12/16/24 0111)  oxyCODONE  (Oxy IR/ROXICODONE ) immediate  release tablet 10 mg (10 mg Oral Given 12/16/24 0433)     IMPRESSION / MDM / ASSESSMENT AND PLAN / ED COURSE  I reviewed the triage vital signs and the nursing notes.                                Patient's presentation is most consistent with acute presentation with potential threat to life or bodily function.  Differential diagnosis includes, but is not limited to, diarrheal illness, gastroenteritis, dehydration, GI bleeding, intra-abdominal infection surgical pathologies considered but less likely   The patient is on the cardiac monitor to evaluate for evidence of arrhythmia and/or significant heart rate changes.  MDM:    Patient is concerned that he might be having GI bleeding with the dark stools and diarrheal illness over the last several days associate with crampy abdominal pain.  Fortunately vital signs are very strong and H&H is normal despite the 5 days of black tarry stools reported by patient and further reassuring clinical exam today with brown stool guaiac negative.  I highly doubt  GI bleeding.  With a nonfocal nonperitoneal largely benign abdominal exam doubt surgical abdominal pathologies defer advanced imaging today.  Appropriate for discharge with anticipatory guidance and strict return precautions given to the patient.  Discharged in stable condition.       FINAL CLINICAL IMPRESSION(S) / ED DIAGNOSES   Final diagnoses:  Diarrhea, unspecified type  Dark stools     Rx / DC Orders   ED Discharge Orders          Ordered    Ambulatory referral to Gastroenterology        12/16/24 0405    ondansetron  (ZOFRAN -ODT) 4 MG disintegrating tablet  Every 8 hours PRN        12/16/24 0424             Note:  This document was prepared using Dragon voice recognition software and may include unintentional dictation errors.    Cyrena Mylar, MD 12/16/24 [phone]  "

## 2024-12-16 NOTE — Discharge Instructions (Addendum)
 Take Zofran  as needed for nausea and vomiting. Drink plenty of fluids to stay well-hydrated.  Find Pedialyte or similar electrolyte rehydration formulas at your local pharmacy.  Fortunately your testing in the emergency department did not show any blood in the stools or signs of blood loss.  Thank you for choosing us  for your health care today!  Please see your primary doctor this week for a follow up appointment.   If you have any new, worsening, or unexpected symptoms call your doctor right away or come back to the emergency department for reevaluation.  It was my pleasure to care for you today.   Ginnie EDISON Cyrena, MD

## 2024-12-17 ENCOUNTER — Emergency Department

## 2024-12-17 ENCOUNTER — Emergency Department
Admission: EM | Admit: 2024-12-17 | Discharge: 2024-12-18 | Disposition: A | Attending: Emergency Medicine | Admitting: Emergency Medicine

## 2024-12-17 ENCOUNTER — Other Ambulatory Visit: Payer: Self-pay

## 2024-12-17 DIAGNOSIS — I2510 Atherosclerotic heart disease of native coronary artery without angina pectoris: Secondary | ICD-10-CM | POA: Insufficient documentation

## 2024-12-17 DIAGNOSIS — J449 Chronic obstructive pulmonary disease, unspecified: Secondary | ICD-10-CM | POA: Insufficient documentation

## 2024-12-17 DIAGNOSIS — R079 Chest pain, unspecified: Secondary | ICD-10-CM | POA: Insufficient documentation

## 2024-12-17 DIAGNOSIS — Z720 Tobacco use: Secondary | ICD-10-CM | POA: Insufficient documentation

## 2024-12-17 DIAGNOSIS — E119 Type 2 diabetes mellitus without complications: Secondary | ICD-10-CM | POA: Insufficient documentation

## 2024-12-17 DIAGNOSIS — M79661 Pain in right lower leg: Secondary | ICD-10-CM | POA: Insufficient documentation

## 2024-12-17 DIAGNOSIS — I509 Heart failure, unspecified: Secondary | ICD-10-CM | POA: Insufficient documentation

## 2024-12-17 DIAGNOSIS — Z955 Presence of coronary angioplasty implant and graft: Secondary | ICD-10-CM | POA: Insufficient documentation

## 2024-12-17 LAB — CBC
HCT: 50.4 % (ref 39.0–52.0)
Hemoglobin: 16.8 g/dL (ref 13.0–17.0)
MCH: 31.3 pg (ref 26.0–34.0)
MCHC: 33.3 g/dL (ref 30.0–36.0)
MCV: 94 fL (ref 80.0–100.0)
Platelets: 146 10*3/uL — ABNORMAL LOW (ref 150–400)
RBC: 5.36 MIL/uL (ref 4.22–5.81)
RDW: 14.6 % (ref 11.5–15.5)
WBC: 6.3 10*3/uL (ref 4.0–10.5)
nRBC: 0 % (ref 0.0–0.2)

## 2024-12-17 LAB — BASIC METABOLIC PANEL WITH GFR
Anion gap: 11 (ref 5–15)
BUN: 11 mg/dL (ref 8–23)
CO2: 32 mmol/L (ref 22–32)
Calcium: 9.7 mg/dL (ref 8.9–10.3)
Chloride: 101 mmol/L (ref 98–111)
Creatinine, Ser: 0.95 mg/dL (ref 0.61–1.24)
GFR, Estimated: >60 mL/min
Glucose, Bld: 94 mg/dL (ref 70–99)
Potassium: 3.3 mmol/L — ABNORMAL LOW (ref 3.5–5.1)
Sodium: 144 mmol/L (ref 135–145)

## 2024-12-17 LAB — TROPONIN T, HIGH SENSITIVITY: Troponin T High Sensitivity: 24 ng/L — ABNORMAL HIGH (ref 0–19)

## 2024-12-17 NOTE — ED Notes (Signed)
"  Urine sent down  "

## 2024-12-17 NOTE — ED Triage Notes (Addendum)
 Pt to ed from home via [facility] for CP. Started today. Pt took 3 NTG at home. 1 SL NTG for EMS and 324 MG ASA by EMS. Pt has 20G L wrist. Pt is caox4, in no acute distress and ambulatory to triage. Pt is ED HU patient.

## 2024-12-18 ENCOUNTER — Encounter: Payer: Self-pay | Admitting: Oncology

## 2024-12-18 ENCOUNTER — Telehealth: Payer: Self-pay

## 2024-12-18 NOTE — ED Provider Notes (Signed)
 "  [facility] Provider Note   Event Date/Time   First MD Initiated Contact with Patient 12/17/24 2359     (approximate) History  Chest Pain  HPI Lawrence [name] is a 69 y.o. male past medical history of CAD status post stent placement, type 2 diabetes, chronic CHF, and COPD with continued tobacco abuse who presents complaining of substernal chest pressure that worsens with exertion.  Patient states that he has had this pain over the last few months however it has worsened over the past few days and brought him to the emergency department.  Patient also endorses a history of atrial fibrillation and is currently taking Eliquis  with no known missed doses.  Patient denies any reproduction of this pain with palpation.  Patient endorses mild associated shortness of breath ROS: Patient currently denies any vision changes, tinnitus, difficulty speaking, facial droop, sore throat, shortness of breath, abdominal pain, nausea/vomiting/diarrhea, dysuria, or weakness/numbness/paresthesias in any extremity   Physical Exam  Triage Vital Signs: ED Triage Vitals [12/17/24 2108]  Encounter Vitals Group     BP (!) 136/97     Girls Systolic BP Percentile      Girls Diastolic BP Percentile      Boys Systolic BP Percentile      Boys Diastolic BP Percentile      Pulse Rate 82     Resp 16     Temp 97.9 F (36.6 C)     Temp Source Oral     SpO2 98 %     Weight      Height 5' 7 (1.702 m)     Head Circumference      Peak Flow      Pain Score 10     Pain Loc      Pain Education      Exclude from Growth Chart    Most recent vital signs: Vitals:   12/17/24 2108 12/18/24 0012  BP: (!) 136/97 120/88  Pulse: 82 75  Resp: 16 18  Temp: 97.9 F (36.6 C)   SpO2: 98% 100%   General: Awake, oriented x4. CV:  Good peripheral perfusion. Resp:  Normal effort. Abd:  No distention. Other:  Elderly overweight Caucasian male resting comfortably in no acute distress ED Results /  Procedures / Treatments  Labs (all labs ordered are listed, but only abnormal results are displayed) Labs Reviewed  BASIC METABOLIC PANEL WITH GFR - Abnormal; Notable for the following components:      Result Value   Potassium 3.3 (*)    All other components within normal limits  CBC - Abnormal; Notable for the following components:   Platelets 146 (*)    All other components within normal limits  TROPONIN T, HIGH SENSITIVITY - Abnormal; Notable for the following components:   Troponin T High Sensitivity 24 (*)    All other components within normal limits  TROPONIN T, HIGH SENSITIVITY   EKG ED ECG REPORT I, Artist MARLA Kerns, the attending physician, personally viewed and interpreted this ECG. Date: 12/18/2024 EKG Time: 2111 Rate: 103 Rhythm: A-fib QRS Axis: normal Intervals: normal ST/T Wave abnormalities: normal Narrative Interpretation: A-fib.  No evidence of acute ischemia RADIOLOGY ED MD interpretation: 2 view chest x-ray shows no acute findings with a small nodular density in the right upper lobe that is indeterminant. - All radiology independently interpreted and agree with radiology assessment Official radiology report(s): DG Chest 2 View Result Date: 12/17/2024 EXAM: 2 VIEW(S) XRAY OF THE CHEST 12/17/2024  09:42:44 PM COMPARISON: 11/30/2024 CLINICAL HISTORY: Chest pain. FINDINGS: LUNGS AND PLEURA: There is a small nodular density in the right for lobe, indeterminate. No pleural effusion. No pneumothorax. HEART AND MEDIASTINUM: Aortic atherosclerosis. CABG markers noted. No acute abnormality of the cardiac and mediastinal silhouettes. BONES AND SOFT TISSUES: Sternotomy wires noted. Spurring in lower thoracic spine. IMPRESSION: 1. No acute findings. 2. Small nodular density in the right upper lobe, indeterminate. Recommend nonemergent follow-up chest CT. Electronically signed by: Greig Pique MD 12/17/2024 09:56 PM EST RP Workstation: [id number]   PROCEDURES: Critical Care  performed: No Procedures MEDICATIONS ORDERED IN ED: Medications - No data to display IMPRESSION / MDM / ASSESSMENT AND PLAN / ED COURSE  I reviewed the triage vital signs and the nursing notes.                             The patient is on the cardiac monitor to evaluate for evidence of arrhythmia and/or significant heart rate changes. Patient's presentation is most consistent with acute presentation with potential threat to life or bodily function. Patient is a 69 year old male with the above-stated past medical history who presents complaining of substernal chest pressure that is worse with exertion and has been present for the past few months. DDx: ACS, CHF exacerbation, PE, aortic dissection, pneumothorax Plan: CBC, BMP, troponin, chest x-ray, EKG  Patient's troponin is at baseline (mid 20s).  Patient's chest x-ray shows incidental nodule that was relayed to patient.  EKG shows A-fib with mild rapid ventricular response at 103.  On reassessment, patient's A-fib is well-controlled in the 80s at this time.  Discussed with patient the need for cardiology follow-up given his history and patient stated that he already sees a doctor Pendyal in cardiology through [facility] clinic and feels comfortable following up with them as well. Upon reassessment, patient also complains of pain up the right shin that is been present over the last 3 days.  Patient does have tenderness to palpation and states that this is worse when walking.  Concern for possible shinsplints on this right sided patient encouraged to use better shoes as well as avoid heel striking prior to following up with their primary care physician for further evaluation.  Patient agrees with plan for discharge at this time with outpatient cardiology follow-up as needed.  Patient given strict return precautions and all questions were answered prior to discharge  Dispo: Discharge home with cardiology follow-up    FINAL CLINICAL IMPRESSION(S) /  ED DIAGNOSES   Final diagnoses:  Chest pain, unspecified type  Pain in right shin   Rx / DC Orders   ED Discharge Orders     None      Note:  This document was prepared using Dragon voice recognition software and may include unintentional dictation errors.   Ophelia Sipe K, MD 12/18/24 [phone]  "

## 2024-12-18 NOTE — Telephone Encounter (Signed)
 Copied from CRM [id number]. Topic: Clinical - Home Health Verbal Orders >> Dec 18, 2024  3:00 PM Montie POUR wrote: Caller/Agency: Debbie with [facility] Callback Number: Secured Voicemail [phone] Service Requested: Social Worker Frequency: 1 time to see him about community resources  Any new concerns about the patient? No

## 2024-12-19 NOTE — Telephone Encounter (Signed)
 That's fine

## 2024-12-20 ENCOUNTER — Other Ambulatory Visit: Payer: Self-pay

## 2024-12-20 ENCOUNTER — Telehealth: Payer: Self-pay

## 2024-12-20 NOTE — Telephone Encounter (Signed)
 Spoke with debbie and made aware.

## 2024-12-20 NOTE — Transitions of Care (Post Inpatient/ED Visit) (Signed)
 " Transition of Care Week 4/5  Visit Note  12/20/2024  Name: Lawrence [name] MRN: [id number]          DOB: 29-Jul-1956  Situation: Patient enrolled in [facility] 30-day program. Visit completed with patient by telephone.   Background:   Initial Transition Care Management Follow-up Telephone Call Discharge Date and Diagnosis: 11/19/24, COPD with AAcute Exacerbation   Past Medical History:  Diagnosis Date   A-fib (HCC)    Anemia    Anginal pain    Anxiety    Arthritis    Asthma    Atrial fibrillation, chronic (HCC) 10/19/2023   CAD (coronary artery disease)    a. 2002 CABGx2 (LIMA->LAD, VG->VG->OM1);  b. 09/2012 DES->OM;  c. 03/2015 PTCA of LAD [facility]) in setting of atretic LIMA; d. 05/2015 Cath [facility]): nonobs dzs; e. 06/2015 Cath ([facility]): LM nl, LAD 45p/d ISR, 50d, D1/2 small, LCX 50p/d ISR, OM1 70ost, 30 ISR, VG->OM1 50ost, 71m, LIMA->LAD 99p/d - atretic, RCA dom, nl; f.cath 10/16: 40-50%(FFR 0.90) pLAD, 75% (FFR 0.77) mLAD s/p PCI/DES, oRCA 40% (FFR0.95)   Cancer (HCC)    SKIN CANCER ON BACK   Celiac disease    Chronic diastolic CHF (congestive heart failure) (HCC)    a. 06/2009 Echo: EF 60-65%, Gr 1 DD, triv AI, mildly dil LA, nl RV.   COPD (chronic obstructive pulmonary disease) (HCC)    a. Chronic bronchitis and emphysema.   DDD (degenerative disc disease), lumbar    Diverticulosis    Dysrhythmia    Essential hypertension    GERD (gastroesophageal reflux disease)    History of hiatal hernia    History of kidney stones    H/O   History of tobacco abuse    a. Quit 2014.   Myocardial infarction [facility]) 2002   4 STENTS   Pancreatitis    PSVT (paroxysmal supraventricular tachycardia)    a. 10/2012 Noted on Zio Patch.   RLL pneumonia 05/30/2024   Sleep apnea    LOST CORD TO CPAP -ONLY 02 @ BEDTIME   Tubular adenoma of colon    Type II diabetes mellitus (HCC)     Assessment: Patient Reported Symptoms: Cognitive Cognitive Status: Able to follow simple commands, Alert and oriented to  person, place, and time, Normal speech and language skills      Neurological Neurological Review of Symptoms: No symptoms reported Neurological Management Strategies: Medical device, Medication therapy Neurological Self-Management Outcome: 3 (uncertain)  HEENT HEENT Symptoms Reported: No symptoms reported HEENT Management Strategies: Activity, Medication therapy, Routine screening    Cardiovascular Cardiovascular Symptoms Reported: Chest pain or discomfort Does patient have uncontrolled Hypertension?: Yes Is patient checking Blood Pressure at home?: Yes Patient's Recent BP reading at home: 146/79    Respiratory Respiratory Symptoms Reported: No symptoms reported Respiratory Management Strategies: Adequate rest, Medication therapy, Oxygen  therapy, Routine screening Respiratory Self-Management Outcome: 3 (uncertain)  Endocrine Endocrine Symptoms Reported: No symptoms reported Is patient diabetic?: Yes Is patient checking blood sugars at home?: Yes List most recent blood sugar readings, include date and time of day: 160 Endocrine Self-Management Outcome: 3 (uncertain)  Gastrointestinal Gastrointestinal Symptoms Reported: Abdominal pain or discomfort Additional Gastrointestinal Details: LBM 12/20/24 - normal Gastrointestinal Management Strategies: Medication therapy Gastrointestinal Self-Management Outcome: 4 (good)    Genitourinary Genitourinary Symptoms Reported: Difficulty initiating stream Other Genitourinary Symptoms: chronic - nothing new Genitourinary Self-Management Outcome: 4 (good)  Integumentary   Skin Self-Management Outcome: 4 (good)  Musculoskeletal Musculoskelatal Symptoms Reviewed: Back pain, Unsteady gait, Weakness Additional Musculoskeletal Details:  chronic Musculoskeletal Management Strategies: Activity, Adequate rest, Fluid modification, Medication therapy, Routine screening Musculoskeletal Self-Management Outcome: 3 (uncertain)      Psychosocial Psychosocial  Symptoms Reported: Depression - if selected complete PHQ 2-9, Anxiety - if selected complete GAD Additional Psychological Details: Referral made 12/12/24 for LCSW assessment -awaiting Behavioral Management Strategies: Medication therapy Behavioral Health Self-Management Outcome: 3 (uncertain)       Today's Vitals   12/20/24 1138  BP: (!) 146/79  Pulse: 73      Medications Reviewed Today     Reviewed by Eilleen Richerd GRADE, RN (Registered Nurse) on 12/20/24 at 1728  Med List Status: <None>   Medication Order Taking? Sig Documenting Provider Last Dose Status Informant  Accu-Chek Softclix Lancets lancets [id number]  USE TO TEST BLOOD SUGAR DAILY AS DIRECTED [name], MD  Active   acetaminophen  (TYLENOL ) 325 MG tablet [id number]  Take 2 tablets (650 mg total) by mouth every 6 (six) hours as needed for mild pain (pain score 1-3) or fever (or Fever >/= 101). Dorinda Drue DASEN, MD  Active Self           Med Note VASHTI INOCENTE PARAS   Sat Nov 18, 2024  2:17 AM)    albuterol  (PROVENTIL ) (2.5 MG/3ML) 0.083% nebulizer solution [id number]  Take 3 mLs (2.5 mg total) by nebulization every 6 (six) hours as needed for wheezing or shortness of breath. [name], MD  Active Self           Med Note [name] Nov 22, 2024  8:45 PM)    allopurinol  (ZYLOPRIM ) 300 MG tablet [id number]  TAKE 1 TABLET BY MOUTH TWICE A DAY [name], MD  Active Self  ALPRAZolam  (XANAX ) 1 MG tablet [id number]  Take 1 tablet (1 mg total) by mouth 3 (three) times daily as needed. [name], MD  Active   apixaban  (ELIQUIS ) 5 MG TABS tablet [id number]  Take 1 tablet (5 mg total) by mouth 2 (two) times daily. [name], MD  Active Self  atorvastatin  (LIPITOR ) 80 MG tablet [id number]  TAKE 1 TABLET BY MOUTH AT BEDTIME [name], MD  Active Self  Blood Glucose Monitoring Suppl (ACCU-CHEK GUIDE) w/Device KIT [id number]  Use to check blood sugars as directed [name], MD  Active Self  Blood  Pressure Monitor DEVI [id number]  Use to check blood pressure daily [name], MD  Active Self  BREZTRI  AEROSPHERE 160-9-4.8 MCG/ACT AERO inhaler [id number]  INHALE 2 PUFFS BY MOUTH INTO THE LUNGS 2TIMES DAILY [name], MD  Active Self  cetirizine  (ZYRTEC ) 10 MG tablet [id number]  TAKE 1 TABLET BY MOUTH AT BEDTIME [name], MD  Active Self  cholecalciferol  (VITAMIN D3) 25 MCG (1000 UNIT) tablet [id number]  Take 1,000 Units by mouth daily. [provider]  Active Self  Cyanocobalamin  (VITAMIN B-12) 1000 MCG SUBL [id number]  Place 1 tablet under the tongue daily. [provider]  Active Self  dapagliflozin  propanediol (FARXIGA ) 10 MG TABS tablet [id number]  TAKE 1 TABLET BY MOUTH DAILY [name], MD  Active Self  famotidine  (PEPCID ) 20 MG tablet [id number]  TAKE 1 TABLET BY MOUTH TWICE A DAY [name], MD  Active Self  furosemide  (LASIX ) 20 MG tablet [id number]  Take 20 mg by mouth as needed for fluid or edema. [provider]  Active Self  glucose blood (ACCU-CHEK GUIDE TEST) test strip [id number]  USE TO TEST BLOOD  SUGAR DAILY AS DIRECTED [name], MD  Active   iron  polysaccharides (FERREX 150) 150 MG capsule [id number]  Take one capsul every Monday, Wednesday, and Friday [name], MD  Active Self  metFORMIN  (GLUCOPHAGE -XR) 500 MG 24 hr tablet [id number]  TAKE 1 TABLET BY MOUTH DAILY WITH EVENING MEAL [name], MD  Active Self  methocarbamol  (ROBAXIN ) 500 MG tablet [id number]  TAKE 1 TABLET BY MOUTH EVERY 8 HOURS AS NEEDED FOR MUSCLE SPASMS [name], MD  Active Self           Med Note VALLERIE LUKES M   Wed Nov 22, 2024  8:56 PM)    metoprolol  succinate (TOPROL -XL) 50 MG 24 hr tablet [id number]  Take 1 tablet (50 mg total) by mouth daily. Take with or immediately following a meal. [name], MD  Active Self  nitroGLYCERIN  (NITROSTAT ) 0.4 MG SL tablet [id number]  Place 1 tablet (0.4 mg total) under the tongue  every 5 (five) minutes as needed for chest pain (Do not take if blood pressure is low, systolic BP<110). [name], MD  Active Self           Med Note VALLERIE LUKES CHRISTELLA Stevan Nov 22, 2024  8:54 PM)    omega-3 acid ethyl esters (LOVAZA ) 1 g capsule [id number]  TAKE 4 CAPSULES (4 GRAMS TOTAL) BY WARDEN [name], MD  Active   ondansetron  (ZOFRAN -ODT) 4 MG disintegrating tablet [id number]  Take 1 tablet (4 mg total) by mouth every 8 (eight) hours as needed for nausea or vomiting. Cyrena Mylar, MD  Active   oxyCODONE -acetaminophen  (PERCOCET) 10-325 MG tablet [id number]  TAKE 1 TABLET BY MOUTH EVERY 4 HOURS AS NEEDED FOR PAIN [name], MD  Active   OXYGEN  [id number]  Inhale 2 L into the lungs at bedtime as needed (for shortness of breath). [provider]  Active Self  pantoprazole  (PROTONIX ) 40 MG tablet [id number]  TAKE 1 TABLET BY MOUTH 2 TIMES DAILY ( BEFORE A MEAL)  Patient taking differently: Take 40 mg by mouth daily before breakfast.   [name], MD  Active Self  ranolazine  (RANEXA ) 1000 MG SR tablet [id number]  Take 1,000 mg by mouth 2 (two) times daily.  Patient not taking: Reported on 12/06/2024   [provider]  Active Self  sacubitril -valsartan  (ENTRESTO ) 49-51 MG [id number]  TAKE 1 TABLET BY MOUTH TWICE A DAY [name], MD  Active Self  spironolactone  (ALDACTONE ) 25 MG tablet [id number]  Take 25 mg by mouth 2 (two) times daily.  Patient not taking: Reported on 12/06/2024   [provider]  Active Self           Med Note [name], [name] Nov 28, 2024  1:15 PM) To talk to Dr. Gasper on Friday  traZODone  (DESYREL ) 150 MG tablet [id number]  TAKE 1 TABLET BY MOUTH AT BEDTIME AS NEEDED FOR SLEEP.  Patient taking differently: Take 150 mg by mouth at bedtime. for sleep   [name], MD  Active Self           Med Note [name]   Dju Nov 11, 2024  3:23 PM)              Recommendation:   PCP  Follow-up Continue Current Plan of Care  Follow Up Plan:   Telephone follow-up in 1 week Follow up with [facility] Social workers  Richerd Fish, RN, BSN,  CCM [place]  [facility], [facility] RN Care Manager Direct Dial: [phone]           "

## 2024-12-22 ENCOUNTER — Ambulatory Visit: Payer: Self-pay

## 2024-12-22 NOTE — Telephone Encounter (Signed)
 This RN called Lawrence back She states that his lungs had more wheezing in his lungs He told her that that was normal She states he has been in and out of the hospital recently and she is actually going to keep him on as a patient she visits for a few more weeks after seeing a change in his respiratory status today (the new wheezing noted) Lawrence wanted to report it to the patient's PCP   This RN called [facility] and advised them of patient's symptoms and him saying   ---Spoke with Odella at [facility] and advised her of patient's symptoms and saying he will go to the ER but it's unclear because he initially refused      FYI Only or Action Required?: FYI only for provider: ED advised.  Patient was last seen in primary care on 08/11/2024 by [name], MD.  Called Nurse Triage reporting Dizziness.  Symptoms began yesterday.  Interventions attempted: Rest, hydration, or home remedies.  Symptoms are: gradually worsening.  Triage Disposition: Go to ED Now (or PCP Triage)  Patient/caregiver understands and will follow disposition?: Unsure         Message from Lawrence [name] sent at 12/22/2024  1:01 PM EST  Reason for Triage: Lawrence [name] Nurse with [facility] More wheezing on expiration Best contact [phone]   Reason for Disposition  Patient sounds very sick or weak to the triager  Answer Assessment - Initial Assessment Questions Home Health Nurse from [place], Lawrence, called and advised that the patient had more wheezing on expiration. She was not with the patient anymore  This RN called the patient: Patient feels light headed, blood pressure is up, back started hurting so he took an Oxycodone  10-325mg  about 30 minutes prior to Triage  Light headedness started last night per patient  He denies any falls, injuries, difficulty breathing  Patient states he is having a little chest pain on left side of chest & he states it's an aching feeling ---since this morning is when it  started  Patient is advised that the ER Recommendation  Patient states initially that he did not want to go to the Emergency Room but after speaking with him, patient states he will go and he is going to get his grandson to call an ambulance for him.  Patient is advised to call us  back if anything changes or with any further questions/concerns. Patient is advised that if anything worsens to go to the Emergency Room. Patient verbalized understanding.    This RN called Lawrence back She states that his lungs had more wheezing in his lungs He told her that that was normal She states he has been in and out of the hospital recently and she is actually going to keep him on as a patient she visits for a few more weeks after seeing a change in his respiratory status today (the new wheezing noted) Lawrence wanted to report it to the patient's PCP   This RN called [facility] and advised them of patient's symptoms and him saying   ---Spoke with Odella at [facility] and advised her of patient's symptoms and saying he will go to the ER but it's unclear because he initially refused  Protocols used: Dizziness - Lightheadedness-A-AH

## 2024-12-25 ENCOUNTER — Telehealth

## 2025-03-20 ENCOUNTER — Ambulatory Visit
